# Patient Record
Sex: Female | Born: 1965
Health system: Southern US, Community
[De-identification: ages and names within clinical notes are randomized; demographics above are authoritative.]

## PROBLEM LIST (undated history)

## (undated) ENCOUNTER — Emergency Department (HOSPITAL_COMMUNITY): Admission: EM | Payer: Self-pay | Source: Home / Self Care

## (undated) ENCOUNTER — Emergency Department (HOSPITAL_BASED_OUTPATIENT_CLINIC_OR_DEPARTMENT_OTHER): Payer: Self-pay | Source: Home / Self Care

## (undated) DIAGNOSIS — G8929 Other chronic pain: Secondary | ICD-10-CM

## (undated) DIAGNOSIS — M5441 Lumbago with sciatica, right side: Secondary | ICD-10-CM

## (undated) DIAGNOSIS — R0602 Shortness of breath: Secondary | ICD-10-CM

## (undated) DIAGNOSIS — E785 Hyperlipidemia, unspecified: Secondary | ICD-10-CM

## (undated) DIAGNOSIS — I480 Paroxysmal atrial fibrillation: Secondary | ICD-10-CM

## (undated) DIAGNOSIS — K219 Gastro-esophageal reflux disease without esophagitis: Secondary | ICD-10-CM

## (undated) DIAGNOSIS — R7989 Other specified abnormal findings of blood chemistry: Secondary | ICD-10-CM

## (undated) DIAGNOSIS — M109 Gout, unspecified: Secondary | ICD-10-CM

## (undated) DIAGNOSIS — C801 Malignant (primary) neoplasm, unspecified: Secondary | ICD-10-CM

## (undated) DIAGNOSIS — D519 Vitamin B12 deficiency anemia, unspecified: Secondary | ICD-10-CM

## (undated) DIAGNOSIS — M797 Fibromyalgia: Secondary | ICD-10-CM

## (undated) DIAGNOSIS — E669 Obesity, unspecified: Secondary | ICD-10-CM

## (undated) DIAGNOSIS — I428 Other cardiomyopathies: Secondary | ICD-10-CM

## (undated) DIAGNOSIS — K259 Gastric ulcer, unspecified as acute or chronic, without hemorrhage or perforation: Secondary | ICD-10-CM

## (undated) DIAGNOSIS — I5022 Chronic systolic (congestive) heart failure: Secondary | ICD-10-CM

## (undated) DIAGNOSIS — M5442 Lumbago with sciatica, left side: Secondary | ICD-10-CM

## (undated) DIAGNOSIS — I639 Cerebral infarction, unspecified: Secondary | ICD-10-CM

## (undated) DIAGNOSIS — D75839 Thrombocytosis, unspecified: Secondary | ICD-10-CM

## (undated) DIAGNOSIS — R778 Other specified abnormalities of plasma proteins: Secondary | ICD-10-CM

## (undated) DIAGNOSIS — D3A010 Benign carcinoid tumor of the duodenum: Secondary | ICD-10-CM

## (undated) DIAGNOSIS — E876 Hypokalemia: Secondary | ICD-10-CM

## (undated) DIAGNOSIS — I10 Essential (primary) hypertension: Secondary | ICD-10-CM

## (undated) DIAGNOSIS — E1110 Type 2 diabetes mellitus with ketoacidosis without coma: Secondary | ICD-10-CM

## (undated) DIAGNOSIS — Z765 Malingerer [conscious simulation]: Secondary | ICD-10-CM

## (undated) DIAGNOSIS — D649 Anemia, unspecified: Secondary | ICD-10-CM

## (undated) DIAGNOSIS — R079 Chest pain, unspecified: Secondary | ICD-10-CM

## (undated) DIAGNOSIS — J189 Pneumonia, unspecified organism: Secondary | ICD-10-CM

## (undated) DIAGNOSIS — I517 Cardiomegaly: Secondary | ICD-10-CM

## (undated) DIAGNOSIS — N1832 Chronic kidney disease, stage 3b: Secondary | ICD-10-CM

## (undated) DIAGNOSIS — M4807 Spinal stenosis, lumbosacral region: Secondary | ICD-10-CM

## (undated) DIAGNOSIS — K3184 Gastroparesis: Secondary | ICD-10-CM

## (undated) DIAGNOSIS — N179 Acute kidney failure, unspecified: Secondary | ICD-10-CM

## (undated) DIAGNOSIS — N83209 Unspecified ovarian cyst, unspecified side: Secondary | ICD-10-CM

## (undated) DIAGNOSIS — E119 Type 2 diabetes mellitus without complications: Secondary | ICD-10-CM

## (undated) HISTORY — DX: Chronic systolic (congestive) heart failure: I50.22

## (undated) HISTORY — DX: Paroxysmal atrial fibrillation: I48.0

## (undated) HISTORY — DX: Cardiomegaly: I51.7

## (undated) HISTORY — DX: Hypomagnesemia: E83.42

## (undated) HISTORY — DX: Other cardiomyopathies: I42.8

## (undated) HISTORY — PX: CHOLECYSTECTOMY: SHX55

## (undated) HISTORY — DX: Acute kidney failure, unspecified: N17.9

## (undated) HISTORY — DX: Hypokalemia: E87.6

## (undated) SURGERY — Surgical Case
Anesthesia: *Unknown

---

## 1998-06-30 ENCOUNTER — Emergency Department (HOSPITAL_COMMUNITY): Admission: EM | Admit: 1998-06-30 | Discharge: 1998-06-30 | Payer: Self-pay | Admitting: Emergency Medicine

## 2000-10-12 ENCOUNTER — Inpatient Hospital Stay (HOSPITAL_COMMUNITY): Admission: AD | Admit: 2000-10-12 | Discharge: 2000-10-12 | Payer: Self-pay | Admitting: Obstetrics

## 2000-10-31 ENCOUNTER — Encounter: Admission: RE | Admit: 2000-10-31 | Discharge: 2001-01-29 | Payer: Self-pay | Admitting: Internal Medicine

## 2003-05-25 ENCOUNTER — Inpatient Hospital Stay (HOSPITAL_COMMUNITY): Admission: AD | Admit: 2003-05-25 | Discharge: 2003-05-25 | Payer: Self-pay | Admitting: Family Medicine

## 2008-10-24 ENCOUNTER — Emergency Department (HOSPITAL_COMMUNITY): Admission: EM | Admit: 2008-10-24 | Discharge: 2008-10-24 | Payer: Self-pay | Admitting: Emergency Medicine

## 2009-01-21 ENCOUNTER — Encounter: Admission: RE | Admit: 2009-01-21 | Discharge: 2009-01-21 | Payer: Self-pay | Admitting: Orthopaedic Surgery

## 2009-08-12 ENCOUNTER — Emergency Department (HOSPITAL_COMMUNITY): Admission: EM | Admit: 2009-08-12 | Discharge: 2009-08-12 | Payer: Self-pay | Admitting: Emergency Medicine

## 2009-10-20 ENCOUNTER — Encounter: Admission: RE | Admit: 2009-10-20 | Discharge: 2009-10-20 | Payer: Self-pay | Admitting: Internal Medicine

## 2010-03-24 ENCOUNTER — Emergency Department (HOSPITAL_COMMUNITY)
Admission: EM | Admit: 2010-03-24 | Discharge: 2010-03-24 | Payer: Self-pay | Source: Home / Self Care | Admitting: Emergency Medicine

## 2010-03-25 LAB — BASIC METABOLIC PANEL
BUN: 14 mg/dL (ref 6–23)
CO2: 24 mEq/L (ref 19–32)
Calcium: 9.5 mg/dL (ref 8.4–10.5)
Chloride: 97 mEq/L (ref 96–112)
Creatinine, Ser: 1.13 mg/dL (ref 0.4–1.2)
GFR calc Af Amer: >60 mL/min (ref >60)
GFR calc non Af Amer: 52 mL/min — ABNORMAL LOW (ref >60)
Glucose, Bld: 285 mg/dL — ABNORMAL HIGH (ref 70–99)
Potassium: 3.5 mEq/L (ref 3.5–5.1)
Sodium: 133 mEq/L — ABNORMAL LOW (ref 135–145)

## 2010-03-25 LAB — GLUCOSE, CAPILLARY: Glucose-Capillary: 297 mg/dL — ABNORMAL HIGH (ref 70–99)

## 2010-03-25 LAB — CBC
HCT: 36.2 % (ref 36.0–46.0)
Hemoglobin: 12 g/dL (ref 12.0–15.0)
MCH: 30 pg (ref 26.0–34.0)
MCHC: 33.1 g/dL (ref 30.0–36.0)
MCV: 90.5 fL (ref 78.0–100.0)
Platelets: 370 10*3/uL (ref 150–400)
RBC: 4 MIL/uL (ref 3.87–5.11)
RDW: 13.6 % (ref 11.5–15.5)
WBC: 12.8 10*3/uL — ABNORMAL HIGH (ref 4.0–10.5)

## 2010-03-25 LAB — DIFFERENTIAL
Basophils Absolute: 0 10*3/uL (ref 0.0–0.1)
Basophils Relative: 0 % (ref 0–1)
Eosinophils Absolute: 0.1 10*3/uL (ref 0.0–0.7)
Eosinophils Relative: 1 % (ref 0–5)
Lymphocytes Relative: 32 % (ref 12–46)
Lymphs Abs: 4.2 10*3/uL — ABNORMAL HIGH (ref 0.7–4.0)
Monocytes Absolute: 0.7 10*3/uL (ref 0.1–1.0)
Monocytes Relative: 6 % (ref 3–12)
Neutro Abs: 7.8 10*3/uL — ABNORMAL HIGH (ref 1.7–7.7)
Neutrophils Relative %: 61 % (ref 43–77)

## 2010-03-25 LAB — CK TOTAL AND CKMB (NOT AT ARMC)
CK, MB: 3 ng/mL (ref 0.3–4.0)
Relative Index: 1.7 (ref 0.0–2.5)
Total CK: 177 U/L (ref 7–177)

## 2010-03-25 LAB — TROPONIN I: Troponin I: 0.02 ng/mL (ref 0.00–0.06)

## 2010-03-30 ENCOUNTER — Emergency Department (HOSPITAL_COMMUNITY)
Admission: EM | Admit: 2010-03-30 | Discharge: 2010-03-30 | Payer: Self-pay | Source: Home / Self Care | Admitting: Emergency Medicine

## 2010-04-29 ENCOUNTER — Ambulatory Visit (HOSPITAL_COMMUNITY)
Admission: RE | Admit: 2010-04-29 | Discharge: 2010-04-29 | Disposition: A | Discharge status: Home / Self Care | Payer: BC Managed Care – PPO | Source: Ambulatory Visit | Attending: Cardiology | Admitting: Cardiology

## 2010-04-29 DIAGNOSIS — I10 Essential (primary) hypertension: Secondary | ICD-10-CM | POA: Insufficient documentation

## 2010-04-29 DIAGNOSIS — R0602 Shortness of breath: Secondary | ICD-10-CM | POA: Insufficient documentation

## 2010-04-29 DIAGNOSIS — R079 Chest pain, unspecified: Secondary | ICD-10-CM | POA: Insufficient documentation

## 2010-04-29 DIAGNOSIS — E119 Type 2 diabetes mellitus without complications: Secondary | ICD-10-CM | POA: Insufficient documentation

## 2010-04-29 LAB — GLUCOSE, CAPILLARY: Glucose-Capillary: 170 mg/dL — ABNORMAL HIGH (ref 70–99)

## 2010-05-25 LAB — CBC
HCT: 34.1 % — ABNORMAL LOW (ref 36.0–46.0)
Hemoglobin: 11.3 g/dL — ABNORMAL LOW (ref 12.0–15.0)
MCHC: 33 g/dL (ref 30.0–36.0)
MCV: 93.1 fL (ref 78.0–100.0)
Platelets: 338 10*3/uL (ref 150–400)
RBC: 3.66 MIL/uL — ABNORMAL LOW (ref 3.87–5.11)
RDW: 14.7 % (ref 11.5–15.5)
WBC: 12.5 10*3/uL — ABNORMAL HIGH (ref 4.0–10.5)

## 2010-05-25 LAB — COMPREHENSIVE METABOLIC PANEL
ALT: 17 U/L (ref 0–35)
AST: 20 U/L (ref 0–37)
Albumin: 3.6 g/dL (ref 3.5–5.2)
Alkaline Phosphatase: 84 U/L (ref 39–117)
BUN: 8 mg/dL (ref 6–23)
CO2: 30 mEq/L (ref 19–32)
Calcium: 8.7 mg/dL (ref 8.4–10.5)
Chloride: 99 mEq/L (ref 96–112)
Creatinine, Ser: 0.98 mg/dL (ref 0.4–1.2)
GFR calc Af Amer: >60 mL/min (ref >60)
GFR calc non Af Amer: >60 mL/min (ref >60)
Glucose, Bld: 303 mg/dL — ABNORMAL HIGH (ref 70–99)
Potassium: 2.9 mEq/L — ABNORMAL LOW (ref 3.5–5.1)
Sodium: 135 mEq/L (ref 135–145)
Total Bilirubin: 0.8 mg/dL (ref 0.3–1.2)
Total Protein: 7.6 g/dL (ref 6.0–8.3)

## 2010-05-25 LAB — DIFFERENTIAL
Basophils Absolute: 0.1 10*3/uL (ref 0.0–0.1)
Basophils Relative: 1 % (ref 0–1)
Eosinophils Absolute: 0.2 10*3/uL (ref 0.0–0.7)
Eosinophils Relative: 1 % (ref 0–5)
Lymphocytes Relative: 33 % (ref 12–46)
Lymphs Abs: 4.2 10*3/uL — ABNORMAL HIGH (ref 0.7–4.0)
Monocytes Absolute: 0.7 10*3/uL (ref 0.1–1.0)
Monocytes Relative: 5 % (ref 3–12)
Neutro Abs: 7.4 10*3/uL (ref 1.7–7.7)
Neutrophils Relative %: 60 % (ref 43–77)

## 2010-06-13 LAB — GLUCOSE, CAPILLARY: Glucose-Capillary: 414 mg/dL — ABNORMAL HIGH (ref 70–99)

## 2010-07-17 ENCOUNTER — Emergency Department (HOSPITAL_COMMUNITY)
Admission: EM | Admit: 2010-07-17 | Discharge: 2010-07-17 | Disposition: A | Discharge status: Home / Self Care | Payer: BC Managed Care – PPO | Attending: Emergency Medicine | Admitting: Emergency Medicine

## 2010-07-17 DIAGNOSIS — Z794 Long term (current) use of insulin: Secondary | ICD-10-CM | POA: Insufficient documentation

## 2010-07-17 DIAGNOSIS — E119 Type 2 diabetes mellitus without complications: Secondary | ICD-10-CM | POA: Insufficient documentation

## 2010-07-17 DIAGNOSIS — R000 Tachycardia, unspecified: Secondary | ICD-10-CM | POA: Insufficient documentation

## 2010-07-17 DIAGNOSIS — T50901A Poisoning by unspecified drugs, medicaments and biological substances, accidental (unintentional), initial encounter: Secondary | ICD-10-CM | POA: Insufficient documentation

## 2010-07-17 DIAGNOSIS — I10 Essential (primary) hypertension: Secondary | ICD-10-CM | POA: Insufficient documentation

## 2010-07-17 LAB — COMPREHENSIVE METABOLIC PANEL
ALT: 17 U/L (ref 0–35)
AST: 14 U/L (ref 0–37)
Albumin: 3.4 g/dL — ABNORMAL LOW (ref 3.5–5.2)
Alkaline Phosphatase: 96 U/L (ref 39–117)
BUN: 15 mg/dL (ref 6–23)
CO2: 28 mEq/L (ref 19–32)
Calcium: 9.4 mg/dL (ref 8.4–10.5)
Chloride: 94 mEq/L — ABNORMAL LOW (ref 96–112)
Creatinine, Ser: 0.98 mg/dL (ref 0.4–1.2)
GFR calc Af Amer: >60 mL/min (ref >60)
GFR calc non Af Amer: >60 mL/min (ref >60)
Glucose, Bld: 403 mg/dL — ABNORMAL HIGH (ref 70–99)
Potassium: 3.4 mEq/L — ABNORMAL LOW (ref 3.5–5.1)
Sodium: 132 mEq/L — ABNORMAL LOW (ref 135–145)
Total Bilirubin: 0.2 mg/dL — ABNORMAL LOW (ref 0.3–1.2)
Total Protein: 7.3 g/dL (ref 6.0–8.3)

## 2010-07-17 LAB — CBC
HCT: 33.8 % — ABNORMAL LOW (ref 36.0–46.0)
Hemoglobin: 10.9 g/dL — ABNORMAL LOW (ref 12.0–15.0)
MCH: 28.8 pg (ref 26.0–34.0)
MCHC: 32.2 g/dL (ref 30.0–36.0)
MCV: 89.2 fL (ref 78.0–100.0)
Platelets: 377 10*3/uL (ref 150–400)
RBC: 3.79 MIL/uL — ABNORMAL LOW (ref 3.87–5.11)
RDW: 13 % (ref 11.5–15.5)
WBC: 10.1 10*3/uL (ref 4.0–10.5)

## 2010-07-17 LAB — DIFFERENTIAL
Basophils Absolute: 0 10*3/uL (ref 0.0–0.1)
Basophils Relative: 0 % (ref 0–1)
Eosinophils Absolute: 0.2 10*3/uL (ref 0.0–0.7)
Eosinophils Relative: 2 % (ref 0–5)
Lymphocytes Relative: 35 % (ref 12–46)
Lymphs Abs: 3.5 10*3/uL (ref 0.7–4.0)
Monocytes Absolute: 0.5 10*3/uL (ref 0.1–1.0)
Monocytes Relative: 5 % (ref 3–12)
Neutro Abs: 5.9 10*3/uL (ref 1.7–7.7)
Neutrophils Relative %: 59 % (ref 43–77)

## 2010-07-17 LAB — GLUCOSE, CAPILLARY
Glucose-Capillary: 401 mg/dL — ABNORMAL HIGH (ref 70–99)
Glucose-Capillary: 349 mg/dL — ABNORMAL HIGH (ref 70–99)

## 2010-07-17 LAB — POCT CARDIAC MARKERS
CKMB, poc: 2.5 ng/mL (ref 1.0–8.0)
Myoglobin, poc: 121 ng/mL (ref 12–200)
Troponin i, poc: <0.05 ng/mL (ref 0.00–0.09)
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
## 2011-01-18 ENCOUNTER — Emergency Department (HOSPITAL_COMMUNITY): Admission: EM | Admit: 2011-01-18 | Discharge: 2011-01-18 | Discharge status: Against Medical Advice | Payer: Self-pay

## 2011-02-04 ENCOUNTER — Emergency Department (HOSPITAL_BASED_OUTPATIENT_CLINIC_OR_DEPARTMENT_OTHER)
Admission: EM | Admit: 2011-02-04 | Discharge: 2011-02-04 | Discharge status: Against Medical Advice | Payer: Self-pay | Attending: Emergency Medicine | Admitting: Emergency Medicine

## 2011-02-04 DIAGNOSIS — R05 Cough: Secondary | ICD-10-CM

## 2011-02-04 DIAGNOSIS — R059 Cough, unspecified: Secondary | ICD-10-CM | POA: Insufficient documentation

## 2011-02-04 NOTE — ED Notes (Signed)
N/v/d/,sinus and chest congestion,nonprod cough,HA x 1 week

## 2011-02-06 DIAGNOSIS — I639 Cerebral infarction, unspecified: Secondary | ICD-10-CM

## 2011-02-06 HISTORY — DX: Cerebral infarction, unspecified: I63.9
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
## 2011-09-21 ENCOUNTER — Emergency Department (HOSPITAL_COMMUNITY): Payer: Self-pay

## 2011-09-21 ENCOUNTER — Encounter (HOSPITAL_COMMUNITY): Payer: Self-pay | Admitting: *Deleted

## 2011-09-21 ENCOUNTER — Emergency Department (HOSPITAL_COMMUNITY)
Admission: EM | Admit: 2011-09-21 | Discharge: 2011-09-22 | Disposition: A | Discharge status: Home / Self Care | Payer: Self-pay | Attending: Emergency Medicine | Admitting: Emergency Medicine

## 2011-09-21 DIAGNOSIS — I10 Essential (primary) hypertension: Secondary | ICD-10-CM | POA: Insufficient documentation

## 2011-09-21 DIAGNOSIS — Z794 Long term (current) use of insulin: Secondary | ICD-10-CM | POA: Insufficient documentation

## 2011-09-21 DIAGNOSIS — E860 Dehydration: Secondary | ICD-10-CM | POA: Insufficient documentation

## 2011-09-21 DIAGNOSIS — E669 Obesity, unspecified: Secondary | ICD-10-CM | POA: Insufficient documentation

## 2011-09-21 DIAGNOSIS — R739 Hyperglycemia, unspecified: Secondary | ICD-10-CM

## 2011-09-21 DIAGNOSIS — R1110 Vomiting, unspecified: Secondary | ICD-10-CM | POA: Insufficient documentation

## 2011-09-21 DIAGNOSIS — E119 Type 2 diabetes mellitus without complications: Secondary | ICD-10-CM | POA: Insufficient documentation

## 2011-09-21 HISTORY — DX: Essential (primary) hypertension: I10

## 2011-09-21 LAB — HEPATIC FUNCTION PANEL
AST: 17 U/L (ref 0–37)
Albumin: 3.3 g/dL — ABNORMAL LOW (ref 3.5–5.2)
Bilirubin, Direct: <0.1 mg/dL (ref 0.0–0.3)
Total Protein: 7.8 g/dL (ref 6.0–8.3)

## 2011-09-21 LAB — CBC WITH DIFFERENTIAL/PLATELET
Basophils Absolute: 0 10*3/uL (ref 0.0–0.1)
Lymphocytes Relative: 34 % (ref 12–46)
Neutro Abs: 6 10*3/uL (ref 1.7–7.7)
Neutrophils Relative %: 57 % (ref 43–77)
Platelets: 375 10*3/uL (ref 150–400)
RBC: 3.92 MIL/uL (ref 3.87–5.11)
RDW: 13.1 % (ref 11.5–15.5)
WBC: 10.6 10*3/uL — ABNORMAL HIGH (ref 4.0–10.5)

## 2011-09-21 LAB — POCT I-STAT TROPONIN I: Troponin i, poc: 0 ng/mL (ref 0.00–0.08)

## 2011-09-21 LAB — BASIC METABOLIC PANEL
CO2: 26 mEq/L (ref 19–32)
Calcium: 9.6 mg/dL (ref 8.4–10.5)
Chloride: 93 mEq/L — ABNORMAL LOW (ref 96–112)
Potassium: 3.4 mEq/L — ABNORMAL LOW (ref 3.5–5.1)
Sodium: 131 mEq/L — ABNORMAL LOW (ref 135–145)

## 2011-09-21 LAB — GLUCOSE, CAPILLARY: Glucose-Capillary: 329 mg/dL — ABNORMAL HIGH (ref 70–99)

## 2011-09-21 IMAGING — CT CT HEAD W/O CM
1 series · 16 of 30 positions shown, 20 images · non-contrast
Comparison: None.

CLINICAL DATA: Headache and blurred vision.

CT HEAD WITHOUT CONTRAST
TECHNIQUE: Contiguous axial images were obtained from the base of
the skull through the vertex without contrast

[Series 2: headseq 4.8 h45s · axial · 0.43mm/px · z∈[-151,+2]mm · 16 of 36 slices shown, 20 images]
[im 2/36  brain]
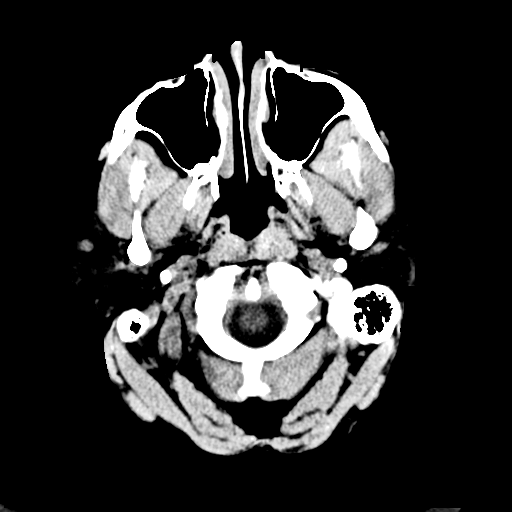
[im 2/36  bone]
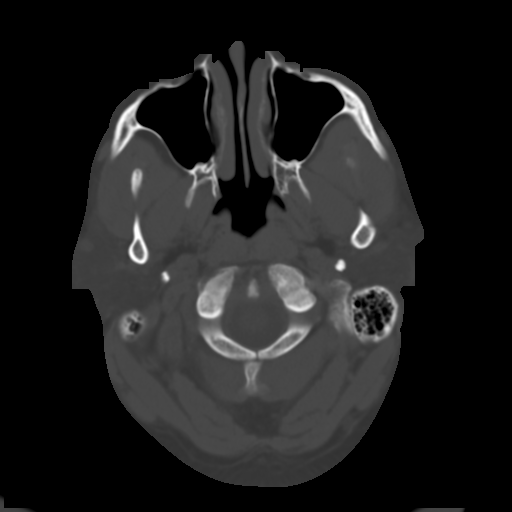
[im 4/36  brain]
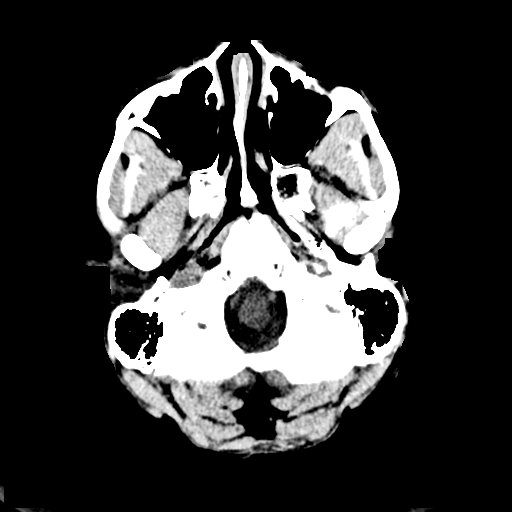
[im 7/36  brain]
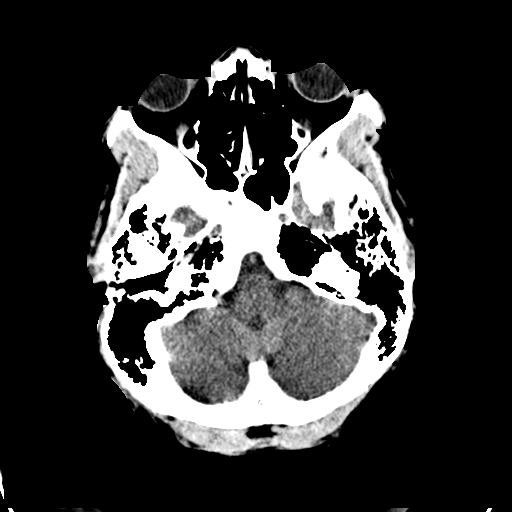
[im 9/36  brain]
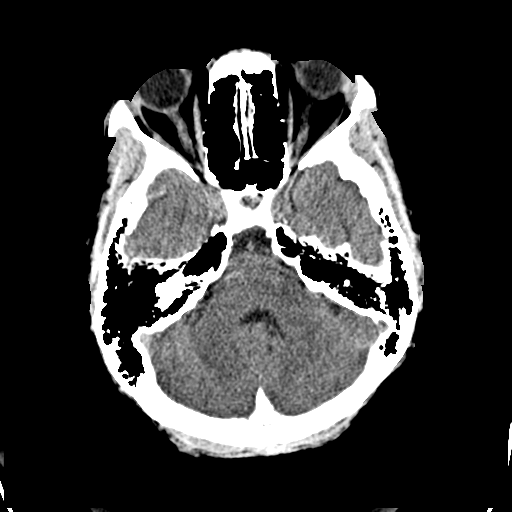
[im 10/36  brain]
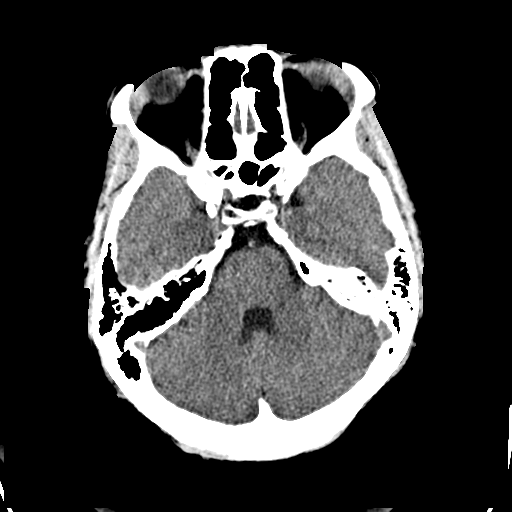
[im 10/36  bone]
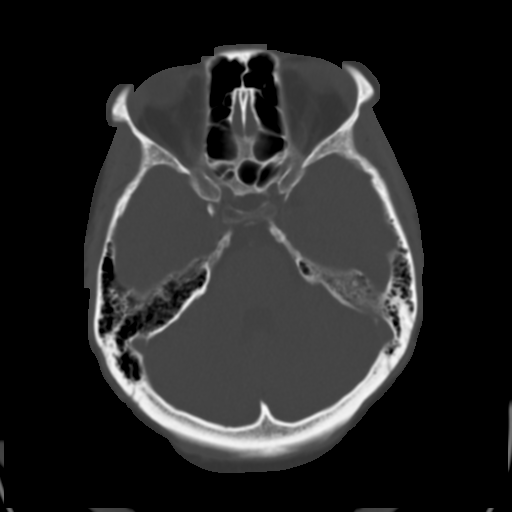
[im 13/36  brain]
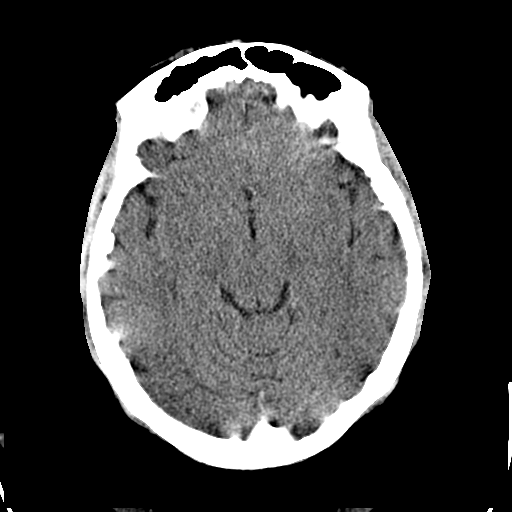
[im 15/36  brain]
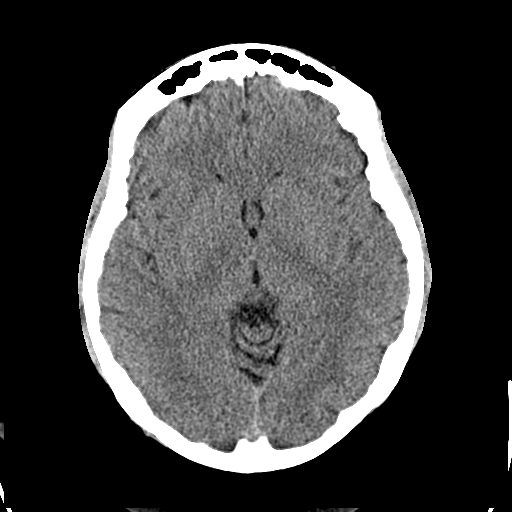
[im 17/36  brain]
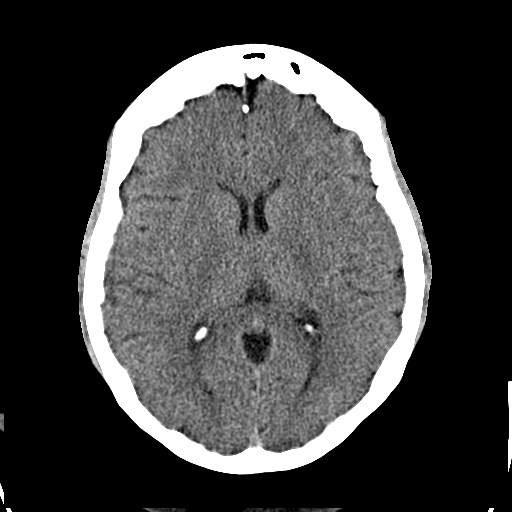
[im 19/36  brain]
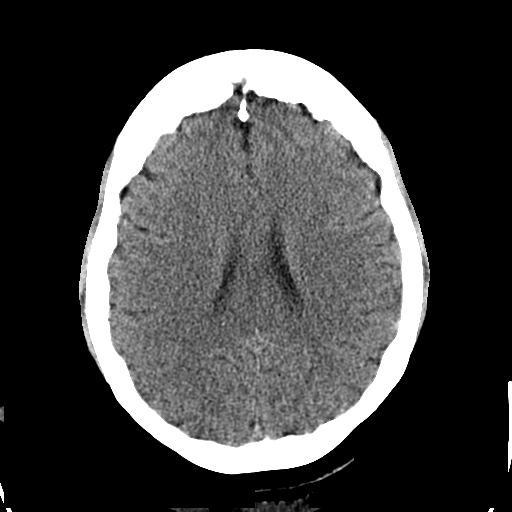
[im 19/36  bone]
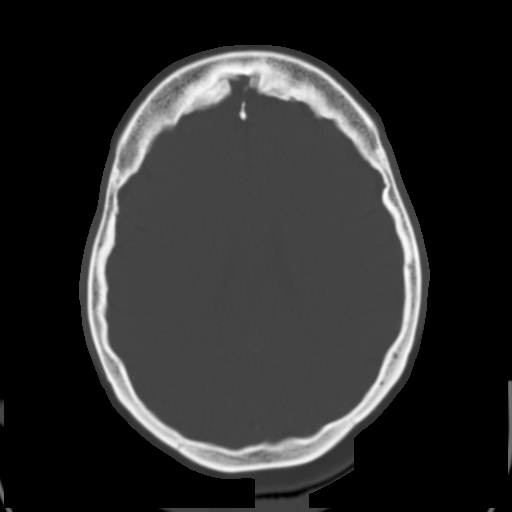
[im 21/36  brain]
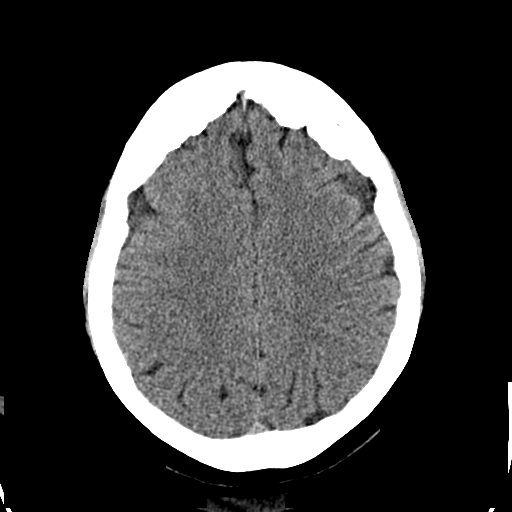
[im 23/36  brain]
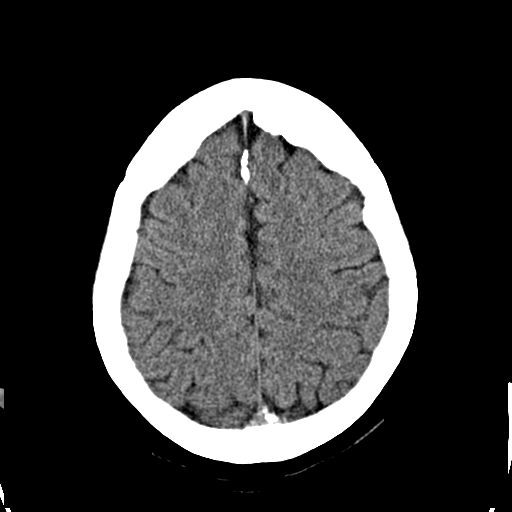
[im 26/36  brain]
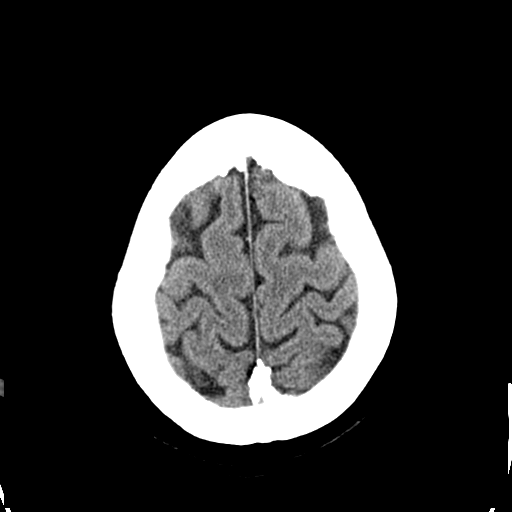
[im 27/36  brain]
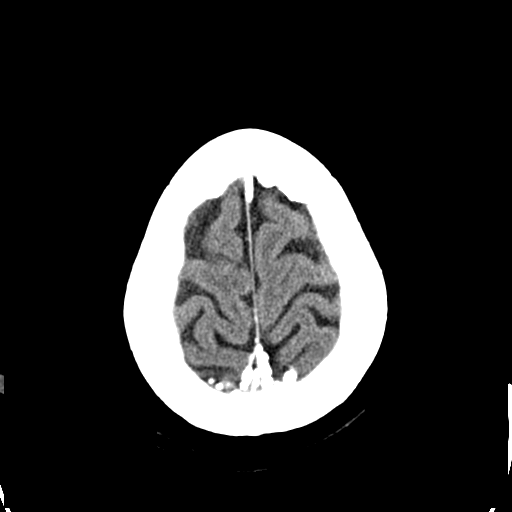
[im 27/36  bone]
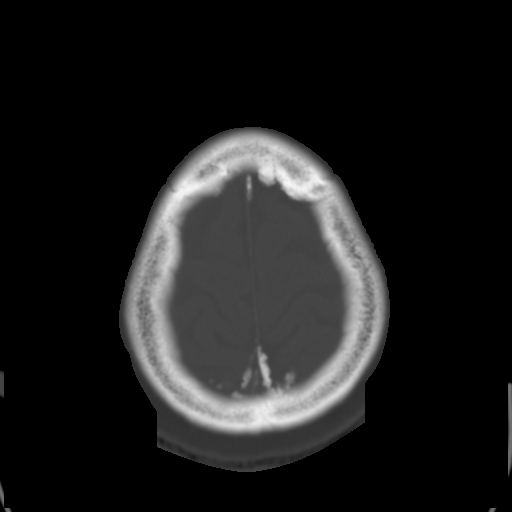
[im 29/36  brain]
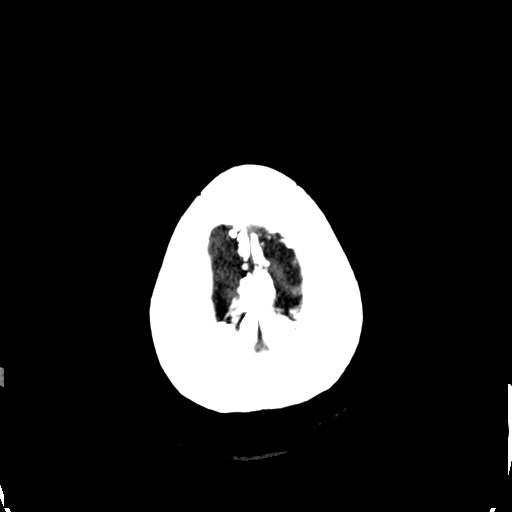
[im 32/36  brain]
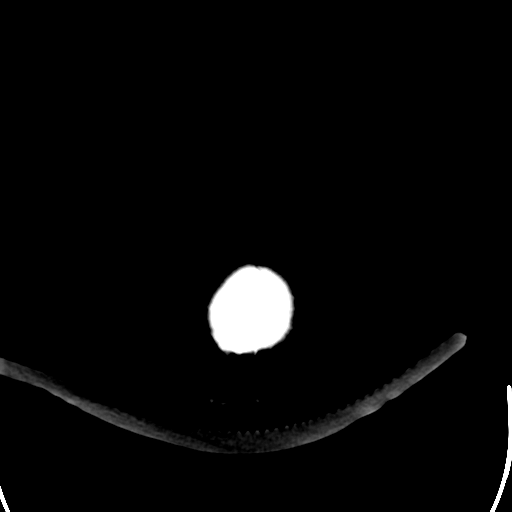
[im 34/36  brain]
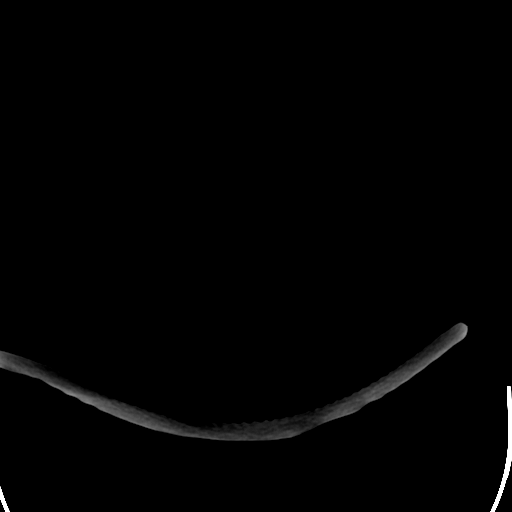

[16 of 30 positions shown; findings below may reference images not displayed]

FINDINGS: The brain has a normal appearance without evidence for
hemorrhage, acute infarction, hydrocephalus, or mass lesion.  There
is no extra axial fluid collection.  The skull and paranasal
sinuses are normal.
IMPRESSION: Normal CT of the head without contrast.

## 2011-09-21 MED ORDER — SODIUM CHLORIDE 0.9 % IV BOLUS (SEPSIS)
1000.0000 mL | Freq: Once | INTRAVENOUS | Status: AC
  Administered 2011-09-21: 1000 mL via INTRAVENOUS

## 2011-09-21 MED ORDER — INSULIN ASPART 100 UNIT/ML ~~LOC~~ SOLN
15.0000 [IU] | Freq: Once | SUBCUTANEOUS | Status: AC
  Administered 2011-09-21: 15 [IU] via SUBCUTANEOUS
  Filled 2011-09-21: qty 1

## 2011-09-21 MED ORDER — HYDROCHLOROTHIAZIDE 50 MG PO TABS: 50.0000 mg | ORAL_TABLET | Freq: Two times a day (BID) | ORAL | Status: DC

## 2011-09-21 MED ORDER — ONDANSETRON HCL 4 MG/2ML IJ SOLN
4.0000 mg | Freq: Once | INTRAMUSCULAR | Status: AC
  Administered 2011-09-21: 4 mg via INTRAVENOUS
  Filled 2011-09-21: qty 2

## 2011-09-21 MED ORDER — AMLODIPINE BESYLATE 5 MG PO TABS: 5.0000 mg | ORAL_TABLET | Freq: Every day | ORAL | Status: DC

## 2011-09-21 MED ORDER — INSULIN NPH ISOPHANE & REGULAR (70-30) 100 UNIT/ML ~~LOC~~ SUSP: 25.0000 [IU] | Freq: Three times a day (TID) | SUBCUTANEOUS | Status: DC

## 2011-09-21 MED ORDER — PROMETHAZINE HCL 25 MG PO TABS: 25.0000 mg | ORAL_TABLET | Freq: Four times a day (QID) | ORAL | Status: DC | PRN

## 2011-09-21 MED ORDER — ASPIRIN 81 MG PO CHEW
324.0000 mg | CHEWABLE_TABLET | Freq: Once | ORAL | Status: AC
  Administered 2011-09-21: 324 mg via ORAL
  Filled 2011-09-21: qty 4

## 2011-09-21 MED ORDER — HYDROMORPHONE HCL PF 1 MG/ML IJ SOLN
1.0000 mg | Freq: Once | INTRAMUSCULAR | Status: AC
  Administered 2011-09-21: 1 mg via INTRAVENOUS
  Filled 2011-09-21: qty 1

## 2011-09-21 NOTE — ED Provider Notes (Signed)
History     [name]: [id number]  Arrival date & time 09/21/11  [id number]   First MD Initiated Contact with Patient 09/21/11 2052      Chief Complaint  Patient presents with  . Chest Pain  . Hyperglycemia    (Consider location/radiation/quality/duration/timing/severity/associated sxs/prior treatment) HPI Comments: Out of insulin for 6 weeks now due to being unable to afford it.  Patient is a 46 y.o. female presenting with abdominal pain. The history is provided by the patient.  Abdominal Pain The primary symptoms of the illness include abdominal pain, shortness of breath, nausea, vomiting and diarrhea. The primary symptoms of the illness do not include fever. Episode onset: 2 weeks ago. The onset of the illness was gradual. The problem has been gradually worsening.  The abdominal pain is located in the LUQ, epigastric region and RUQ. The abdominal pain radiates to the chest. The severity of the abdominal pain is 7/10. The abdominal pain is relieved by nothing. The abdominal pain is exacerbated by vomiting, belching, coughing, deep breathing and certain positions.  The shortness of breath began more than 2 days ago. The shortness of breath is mild.  Onset: 2 weeks ago.  now vomiting daily. Vomiting occurs 2 to 5 times per day. The emesis contains stomach contents (dry heaving as well).  Associated with: no alcohol or NSAID use. The patient states that she believes she is currently not pregnant. The patient has not had a change in bowel habit. Additional symptoms associated with the illness include anorexia and frequency. Symptoms associated with the illness do not include urgency or back pain. Significant associated medical issues include diabetes.    Past Medical History  Diagnosis Date  . Diabetes mellitus   . Hypertension     History reviewed. No pertinent past surgical history.  History reviewed. No pertinent family history.  History  Substance Use Topics  . Smoking status: Never  Smoker   . Smokeless tobacco: Not on file  . Alcohol Use: No    OB History    Grav Para Term Preterm Abortions TAB SAB Ect Mult Living                  Review of Systems  Constitutional: Negative for fever.  Respiratory: Positive for shortness of breath.   Gastrointestinal: Positive for nausea, vomiting, abdominal pain, diarrhea and anorexia.  Genitourinary: Positive for frequency. Negative for urgency.  Musculoskeletal: Negative for back pain.  All other systems reviewed and are negative.    Allergies  Valium; Lisinopril; and Penicillins  Home Medications   Current Outpatient Rx  Name Route Sig Dispense Refill  . AMLODIPINE BESYLATE 5 MG PO TABS Oral Take 5 mg by mouth daily.    [name] HYDROCHLOROTHIAZIDE 50 MG PO TABS Oral Take 50 mg by mouth 2 (two) times daily.    . INSULIN ISOPHANE & REGULAR (70-30) 100 UNIT/ML [place] SUSP Subcutaneous Inject 25-75 Units into the skin 3 (three) times daily. 75 units in the morning, 25 units at midday, & 75 units in the evening      BP 139/78  Pulse 116  Temp 99.4 F (37.4 C) (Oral)  Resp 26  Ht 5\' 9"  (1.753 m)  SpO2 97%  LMP 08/22/2011  Physical Exam  Nursing note and vitals reviewed. Constitutional: She is oriented to person, place, and time. She appears well-developed and well-nourished. No distress.       Obese female  HENT:  Head: Normocephalic and atraumatic.  Mouth/Throat: Oropharynx is clear and moist.  Mucous membranes are dry.  Eyes: Conjunctivae and EOM are normal. Pupils are equal, round, and reactive to light.  Neck: Normal range of motion. Neck supple.  Cardiovascular: Regular rhythm and intact distal pulses.  Tachycardia present.   No murmur heard. Pulmonary/Chest: Effort normal and breath sounds normal. No respiratory distress. She has no wheezes. She has no rales.  Abdominal: Soft. Normal appearance. She exhibits no distension. There is tenderness in the right upper quadrant, epigastric area and left upper quadrant.  There is no rebound and no guarding.  Musculoskeletal: Normal range of motion. She exhibits no edema and no tenderness.  Neurological: She is alert and oriented to person, place, and time.  Skin: Skin is warm and dry. No rash noted. No erythema.  Psychiatric: She has a normal mood and affect. Her behavior is normal.    ED Course  Procedures (including critical care time)  Labs Reviewed  GLUCOSE, CAPILLARY - Abnormal; Notable for the following:    Glucose-Capillary 379 (*)     All other components within normal limits  CBC WITH DIFFERENTIAL - Abnormal; Notable for the following:    WBC 10.6 (*)     Hemoglobin 11.5 (*)     HCT 34.7 (*)     All other components within normal limits  BASIC METABOLIC PANEL - Abnormal; Notable for the following:    Sodium 131 (*)     Potassium 3.4 (*)     Chloride 93 (*)     Glucose, Bld 393 (*)     GFR calc non Af Amer 70 (*)     GFR calc Af Amer 81 (*)     All other components within normal limits  HEPATIC FUNCTION PANEL - Abnormal; Notable for the following:    Albumin 3.3 (*)     Alkaline Phosphatase 120 (*)     Total Bilirubin 0.2 (*)     All other components within normal limits  GLUCOSE, CAPILLARY - Abnormal; Notable for the following:    Glucose-Capillary 329 (*)     All other components within normal limits  POCT I-STAT TROPONIN I  LIPASE, BLOOD  POCT I-STAT TROPONIN I   Dg Chest 2 View  09/21/2011  *RADIOLOGY REPORT*  Clinical Data: Chest pain, hyperglycemia  CHEST - 2 VIEW  Comparison: 03/24/2010  Findings: Cardiomediastinal silhouette is within normal limits. The lungs are clear. No pleural effusion.  No pneumothorax.  No acute osseous abnormality. Lung volumes are low with crowding of the bronchovascular markings.  IMPRESSION: Normal chest, allowing for hypoaeration.  Original Report Authenticated By: Arline Asp, M.D.     Date: 09/21/2011  Rate: 120  Rhythm: sinus tachycardia  QRS Axis: normal  Intervals: normal  ST/T  Wave abnormalities: normal  Conduction Disutrbances:none  Narrative Interpretation:   Old EKG Reviewed: unchanged   1. Hyperglycemia   2. Dehydration   3. Vomiting       MDM   Patient has been out of her insulin for a month and a half no due to no insurance and being able to afford it. She states for the last one to 2 weeks she's had vomiting multiple times today which has only worsened. Then in the last 4-5 days she started to have chest pain. It is worse with deep breathing and with vomiting. She also complains of shortness of breath which is nonspecific. It's not positional or exertional. She does complain of intermittent coughing that is nonproductive and no fever.   Upper abdominal pain on  exam it initially was tachycardic on presentation. Normal blood pressure and O2 sat.  Lipase is within normal limits, BNP was hyperglycemia with a sugar of 393 and normal LFTs. CBC unremarkable, i-STAT troponin negative, EKG was sinus tachycardia but otherwise normal and negative chest x-ray. The fluids blood sugar only improved to 326 the patient was given IV insulin and another liter. She was given medications to prevent vomiting. Will get a second troponin at 3 hours and feel the patient will be adequately ruled out as her symptoms have been ongoing for multiple days. Low concern for PE at this time and feel her pain as a result of repeated vomiting.  11:47 PM Pt tolerating po's.  Spoke with pharmacy and will get pt her insulin.  Pt has been speaking with multiple manufacturers about getting insulin and recently got a job but insurance does not start for another 3 months.  Second troponin neg.      Blanchie Dessert, MD 09/21/11 2348

## 2011-09-21 NOTE — ED Notes (Signed)
Pt c/o central chest pain that began this AM, states was driving to work when pain started today. Pt also reports hyperglycemia states "I have not had my insulin for a month and a half now because I don't have insurance." pt did not check CBG at home.

## 2011-09-21 NOTE — ED Notes (Signed)
MD at bedside.  EDP Plunkett present to evaluate this pt.  Pt reports vomiting since yesterday- Unable to afford insulin for several months.  Pt reports increased pain to chest after vomiting.  Plan of care discussed with pt and family

## 2011-09-22 ENCOUNTER — Emergency Department (HOSPITAL_COMMUNITY)
Admission: EM | Admit: 2011-09-22 | Discharge: 2011-09-22 | Disposition: A | Discharge status: Home / Self Care | Payer: Self-pay | Attending: Emergency Medicine | Admitting: Emergency Medicine

## 2011-09-22 ENCOUNTER — Encounter (HOSPITAL_COMMUNITY): Payer: Self-pay

## 2011-09-22 ENCOUNTER — Emergency Department (HOSPITAL_COMMUNITY): Payer: Self-pay

## 2011-09-22 DIAGNOSIS — I10 Essential (primary) hypertension: Secondary | ICD-10-CM | POA: Insufficient documentation

## 2011-09-22 DIAGNOSIS — Z7982 Long term (current) use of aspirin: Secondary | ICD-10-CM | POA: Insufficient documentation

## 2011-09-22 DIAGNOSIS — J069 Acute upper respiratory infection, unspecified: Secondary | ICD-10-CM | POA: Insufficient documentation

## 2011-09-22 DIAGNOSIS — Z794 Long term (current) use of insulin: Secondary | ICD-10-CM | POA: Insufficient documentation

## 2011-09-22 DIAGNOSIS — E669 Obesity, unspecified: Secondary | ICD-10-CM | POA: Insufficient documentation

## 2011-09-22 DIAGNOSIS — E119 Type 2 diabetes mellitus without complications: Secondary | ICD-10-CM | POA: Insufficient documentation

## 2011-09-22 LAB — POCT I-STAT, CHEM 8
BUN: 9 mg/dL (ref 6–23)
Chloride: 102 mEq/L (ref 96–112)
Creatinine, Ser: 0.8 mg/dL (ref 0.50–1.10)
Potassium: 3.7 mEq/L (ref 3.5–5.1)
Sodium: 138 mEq/L (ref 135–145)

## 2011-09-22 LAB — D-DIMER, QUANTITATIVE: D-Dimer, Quant: 1 ug/mL-FEU — ABNORMAL HIGH (ref 0.00–0.48)

## 2011-09-22 LAB — GLUCOSE, CAPILLARY: Glucose-Capillary: 282 mg/dL — ABNORMAL HIGH (ref 70–99)

## 2011-09-22 MED ORDER — IPRATROPIUM BROMIDE 0.02 % IN SOLN
0.5000 mg | Freq: Once | RESPIRATORY_TRACT | Status: AC
  Administered 2011-09-22: 0.5 mg via RESPIRATORY_TRACT
  Filled 2011-09-22: qty 2.5

## 2011-09-22 MED ORDER — ALBUTEROL SULFATE HFA 108 (90 BASE) MCG/ACT IN AERS: 1.0000 | INHALATION_SPRAY | Freq: Four times a day (QID) | RESPIRATORY_TRACT | Status: DC | PRN

## 2011-09-22 MED ORDER — IOHEXOL 350 MG/ML SOLN
100.0000 mL | Freq: Once | INTRAVENOUS | Status: AC | PRN
  Administered 2011-09-22: 100 mL via INTRAVENOUS

## 2011-09-22 MED ORDER — ALBUTEROL SULFATE (5 MG/ML) 0.5% IN NEBU
2.5000 mg | INHALATION_SOLUTION | Freq: Once | RESPIRATORY_TRACT | Status: AC
  Administered 2011-09-22: 2.5 mg via RESPIRATORY_TRACT
  Filled 2011-09-22: qty 0.5

## 2011-09-22 MED ORDER — BENZONATATE 100 MG PO CAPS: 100.0000 mg | ORAL_CAPSULE | Freq: Three times a day (TID) | ORAL | Status: AC

## 2011-09-22 MED ORDER — PSEUDOEPHEDRINE HCL ER 120 MG PO TB12: 120.0000 mg | ORAL_TABLET | Freq: Two times a day (BID) | ORAL | Status: AC

## 2011-09-22 NOTE — ED Provider Notes (Signed)
History     [name]: [id number] Arrival date & time 09/22/11  0931 First MD Initiated Contact with Patient 09/22/11 [phone]      Chief Complaint  Patient presents with  . [name]  . Cough    HPI The patient presents to emergency room complaining of persistent shortness of breath. She has been having trouble with nasal congestion and a productive cough of yellow sputum. The symptoms have been ongoing for the last couple of days. Exertion and coughing make the symptoms worse. Nothing seems to be making it better. Patient does have a history of problems with allergies and bronchitis and she states she will often has to take a Z-Pak during this time of year. Patient was actually seen in the emergency room yesterday for similar symptoms as well as high blood sugar and nausea and vomiting. Patient had been out of for diabetes and blood pressure medications for several weeks. She has not been able to see her primary Dr. due to insurance reasons.  Patient states she is still feeling short of breath this morning and she needs some type of medications to help with her congestion and shortness of breath. She denies any trouble with Swelling. She denies any history of PE or DVT. She does not take any oral contraceptives. Past Medical History  Diagnosis Date  . Diabetes mellitus   . Hypertension     History reviewed. No pertinent past surgical history.  History reviewed. No pertinent family history.  History  Substance Use Topics  . Smoking status: Never Smoker   . Smokeless tobacco: Not on file  . Alcohol Use: No    OB History    Grav Para Term Preterm Abortions TAB SAB Ect Mult Living                  Review of Systems  All other systems reviewed and are negative.    Allergies  Valium; Lisinopril; and Penicillins  Home Medications   Current Outpatient Rx  Name Route Sig Dispense Refill  . AMLODIPINE BESYLATE 5 MG PO TABS Oral Take 5 mg by mouth daily.    . ASPIRIN 81 MG PO  TABS Oral Take 81 mg by mouth daily.    [name] HYDROCHLOROTHIAZIDE 50 MG PO TABS Oral Take 50 mg by mouth 2 (two) times daily.    . INSULIN ISOPHANE & REGULAR (70-30) 100 UNIT/ML [place] SUSP Subcutaneous Inject 25-75 Units into the skin 3 (three) times daily. 75 units in the morning, 25 units at midday, & 75 units in the evening    . DAYQUIL PO Oral Take 1 capsule by mouth every 6 (six) hours as needed. For cold symptoms      BP 128/87  Pulse 101  Temp 98.4 F (36.9 C)  Resp 22  SpO2 100%  LMP 08/22/2011  Physical Exam  Nursing note and vitals reviewed. Constitutional: No distress.       Obese  HENT:  Head: Normocephalic and atraumatic.  Right Ear: External ear normal.  Left Ear: External ear normal.  Eyes: Conjunctivae are normal. Right eye exhibits no discharge. Left eye exhibits no discharge. No scleral icterus.  Neck: Neck supple. No tracheal deviation present.  Cardiovascular: Normal rate, regular rhythm and intact distal pulses.   Pulmonary/Chest: Effort normal and breath sounds normal. No stridor. No respiratory distress. She has no wheezes. She has no rales.  Abdominal: Soft. Bowel sounds are normal. She exhibits no distension. There is no tenderness. There is no rebound  and no guarding.  Musculoskeletal: She exhibits no edema and no tenderness.  Neurological: She is alert. She has normal strength. No sensory deficit. Cranial nerve deficit:  no gross defecits noted. She exhibits normal muscle tone. She displays no seizure activity. Coordination normal.  Skin: Skin is warm and dry. No rash noted.  Psychiatric: She has a normal mood and affect.    ED Course  Procedures (including critical care time) EKG Rate 94 SINUS RHYTHM ~ normal P axis, V-rate 50- 99 LEFT AXIS DEVIATION ~ QRS axis (-30,-90) Nl intervals, sinus tach resolved since yesterday. Labs Reviewed  D-DIMER, QUANTITATIVE - Abnormal; Notable for the following:    D-Dimer, Quant 1.00 (*)     All other components within  normal limits  POCT I-STAT, CHEM 8 - Abnormal; Notable for the following:    Glucose, Bld 328 (*)     All other components within normal limits   Dg Chest 2 View  09/21/2011  *RADIOLOGY REPORT*  Clinical Data: Chest pain, hyperglycemia  CHEST - 2 VIEW  Comparison: 03/24/2010  Findings: Cardiomediastinal silhouette is within normal limits. The lungs are clear. No pleural effusion.  No pneumothorax.  No acute osseous abnormality. Lung volumes are low with crowding of the bronchovascular markings.  IMPRESSION: Normal chest, allowing for hypoaeration.  Original Report Authenticated By: Arline Asp, M.D.   Ct Angio Chest W/cm &/or Wo Cm  09/22/2011  *RADIOLOGY REPORT*  Clinical Data: Chest pain.  Short of breath.  Elevated D-dimer.  CT ANGIOGRAPHY CHEST  Technique:  Multidetector CT imaging of the chest using the standard protocol during bolus administration of intravenous contrast. Multiplanar reconstructed images including MIPs were obtained and reviewed to evaluate the vascular anatomy.  Contrast: 141mL OMNIPAQUE IOHEXOL 350 MG/ML SOLN  Comparison: Chest radiography same day  Findings: Pulmonary arterial opacification is moderate.  No sign of pulmonary emboli.  No aortic pathology.  No coronary artery calcification.  No hilar or mediastinal mass or adenopathy.  No pleural or pericardial fluid.  The lungs are clear except for minimal dependent volume loss, probably positional.  No spinal pathology.  IMPRESSION: Negative exam.  No cause of the patient's described symptoms is identified.  No pulmonary emboli.  Original Report Authenticated By: Jules Schick, M.D.    MDM  Pt without signs of PNA, or PE.  No pulm edema noted on xray.  Eval from yesterday reviewed.  She has noticed some improvement with breathing treatments.  Her body habitus may play a role as she is rather obese but overall suspect this is related to URI, congestion.   Will dc home on po decongestant and inhalers.          Kathalene Frames, MD 09/22/11 [phone]

## 2011-09-22 NOTE — ED Notes (Signed)
Patient reports that she was here yesterday and was discharged this AM at [facility]. Patient is c/o nasal congestion and having a productive cough with yellow sputum. Patient states that she had a chest x-ray yesterday and that there was no problems.

## 2011-11-29 IMAGING — US US ABDOMEN COMPLETE
1 series · 13 of 25 positions shown · non-contrast
Comparison: None

CLINICAL DATA: Nausea, vomiting, diarrhea and epigastric abdominal
pain.  History diabetes.

ABDOMEN ULTRASOUND
TECHNIQUE: Complete abdominal ultrasound examination was performed
including evaluation of the liver, gallbladder, bile ducts,
pancreas, kidneys, spleen, IVC, and abdominal aorta.

[Series 1: us abdomen complete · 0.33mm/px · 13 of 68 slices shown]
[im 1/68]
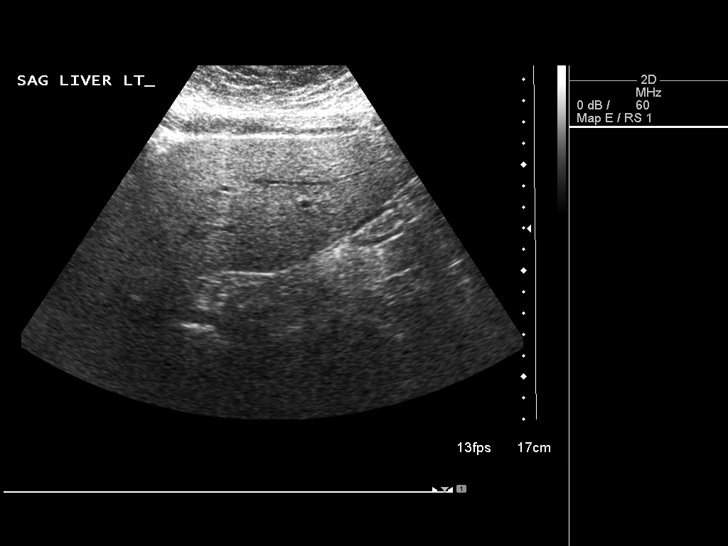
[im 6/68]
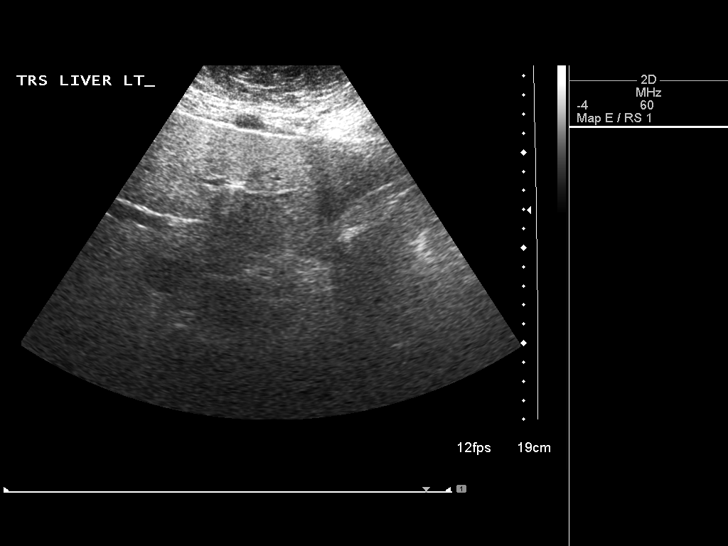
[im 12/68]
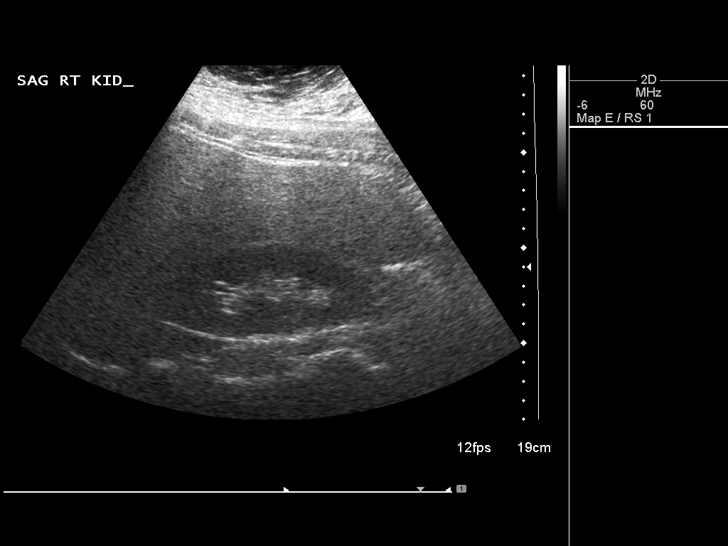
[im 17/68]
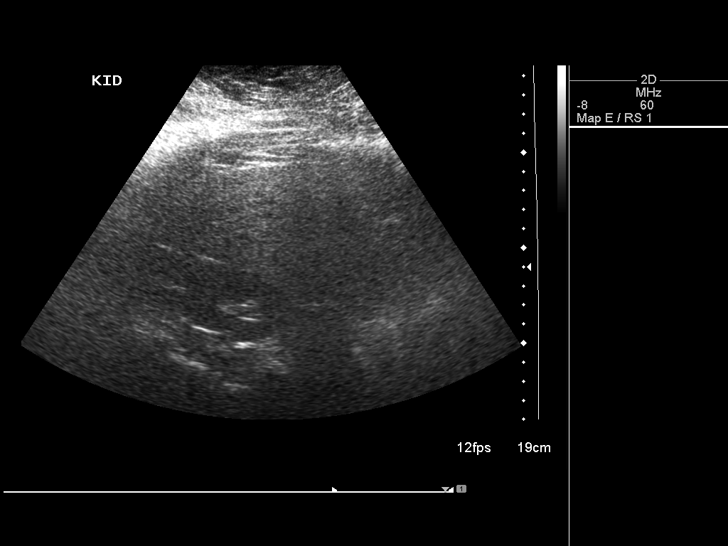
[im 23/68]
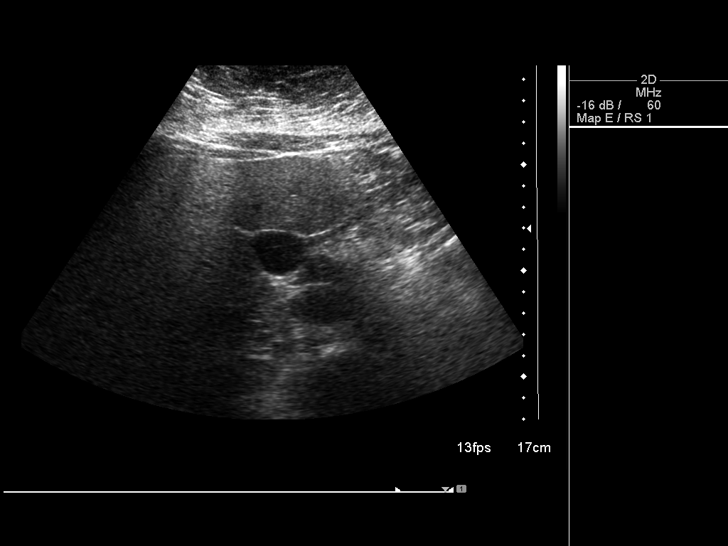
[im 28/68]
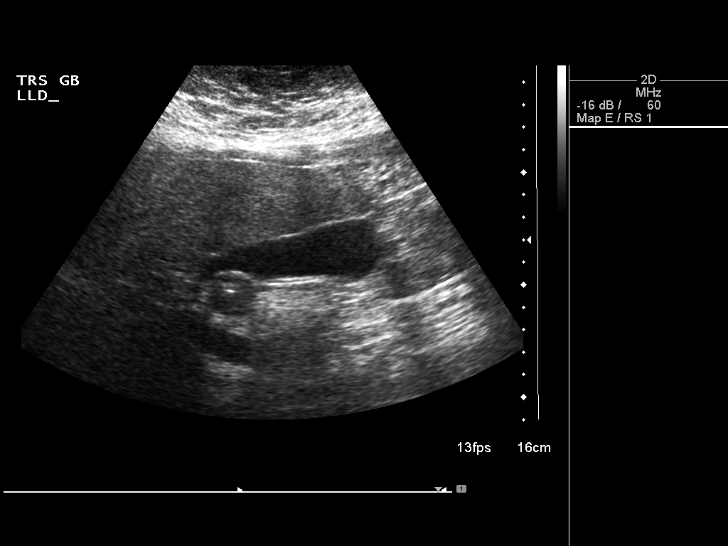
[im 34/68]
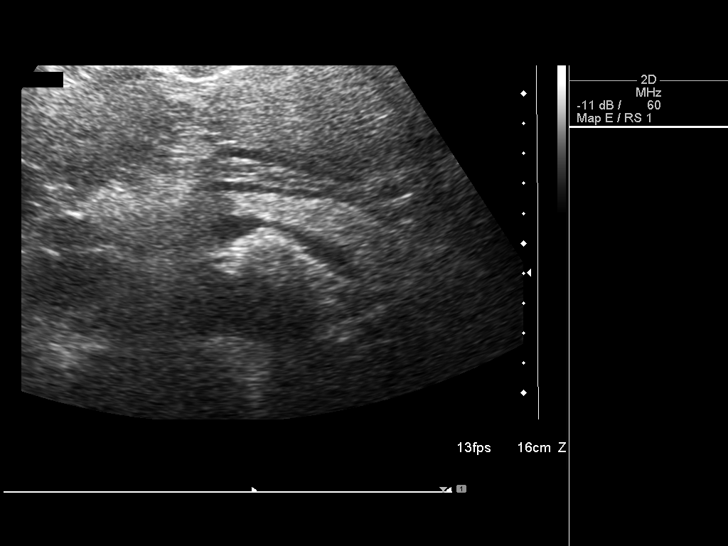
[im 40/68]
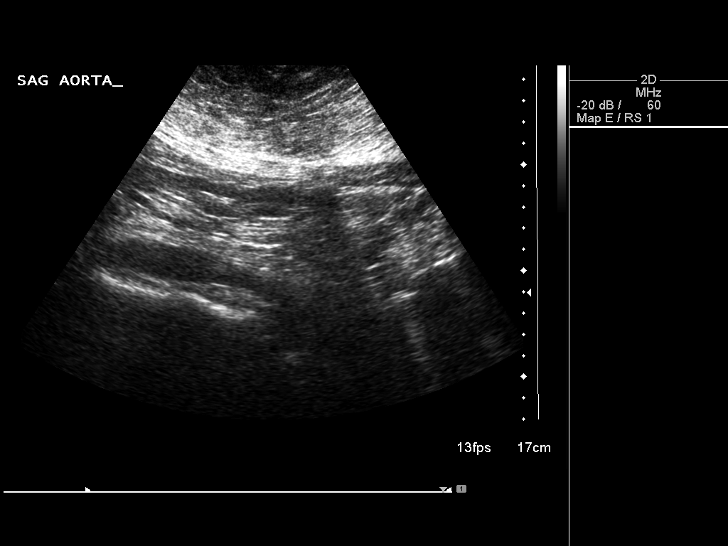
[im 45/68]
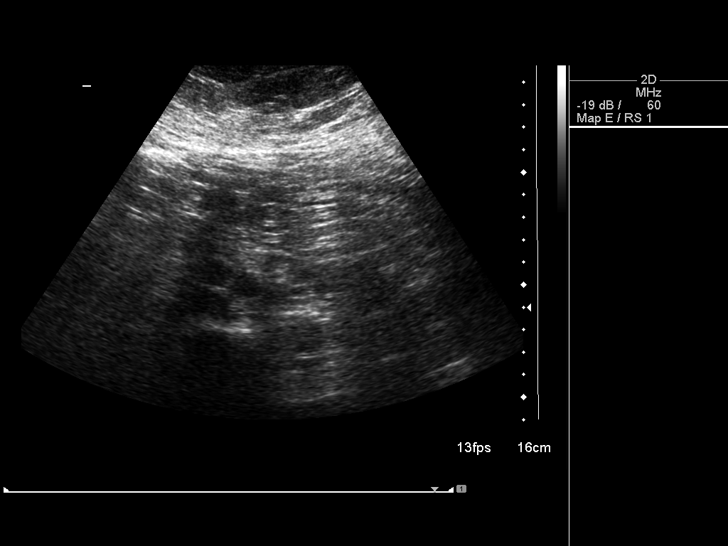
[im 51/68]
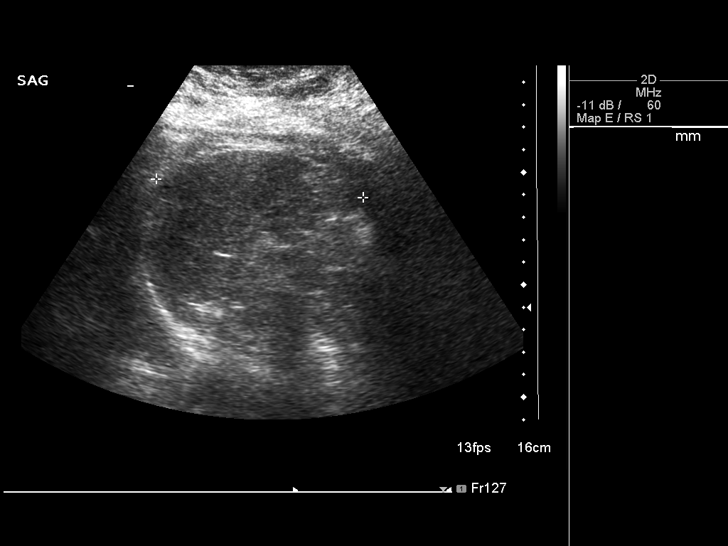
[im 56/68]
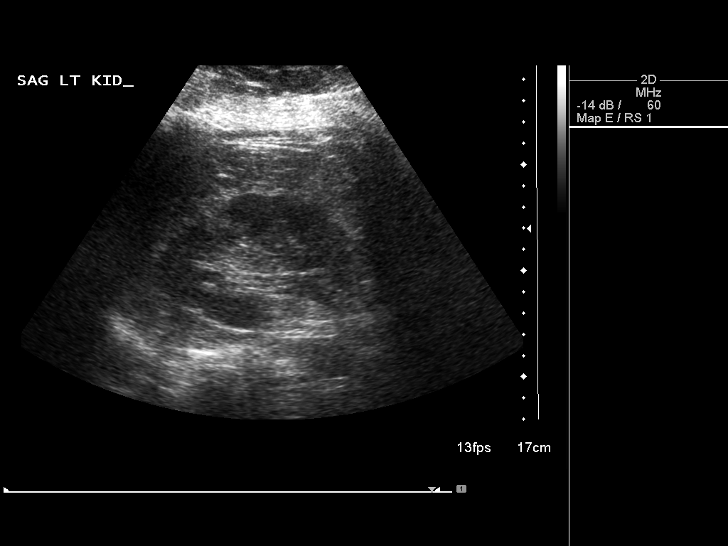
[im 62/68]
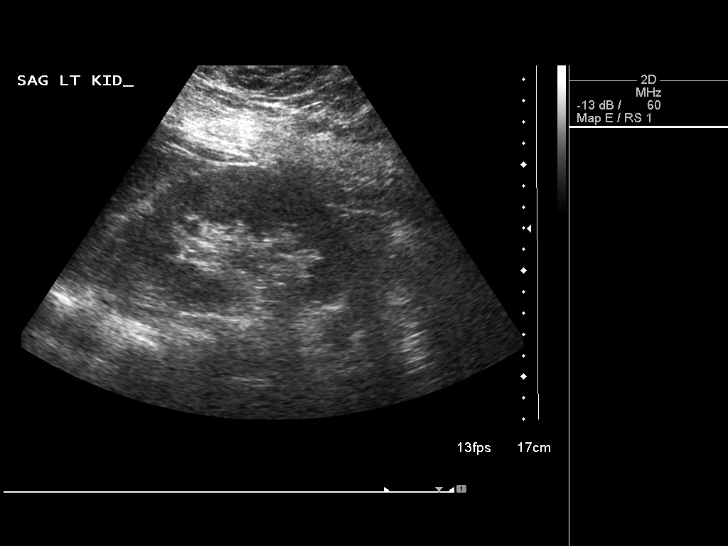
[im 68/68]
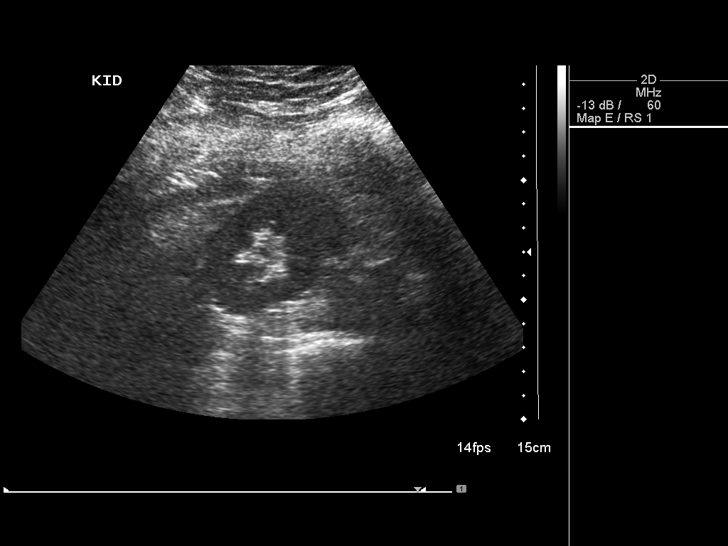

[13 of 25 positions shown; findings below may reference images not displayed]

FINDINGS: Gallbladder:  No shadowing gallstones or echogenic sludge.  No
gallbladder wall thickening or pericholecystic fluid.  Negative
sonographic Murphy's sign according to the ultrasound technologist.

Common Bile Duct:  Normal caliber of 5 mm.

Liver:  Difficult penetration of the liver due to obesity.  The
liver does appear somewhat echogenic, likely reflecting underlying
fatty infiltration.  Sensitivity in detection of hepatic masses is
greatly reduced by ultrasound.  There is no evidence of gross
intrahepatic biliary ductal dilatation.

IVC:  Patent throughout its visualized course in the abdomen.

Pancreas:  Although the pancreas is difficult to visualize in its
entirety, no focal pancreatic abnormality is identified.

Spleen:  The spleen shows normal echotexture and size.

Kidneys:  The right kidney measures 12 cm and the left kidney
cm.  Both have normal sonographic appearance.

Abdominal Aorta:  Normal in caliber throughout its visualized
course in the abdomen.
IMPRESSION: Echogenic liver likely reflecting underlying fatty infiltration.

## 2011-12-21 ENCOUNTER — Encounter (HOSPITAL_COMMUNITY): Payer: Self-pay | Admitting: Emergency Medicine

## 2011-12-21 ENCOUNTER — Emergency Department (HOSPITAL_COMMUNITY)
Admission: EM | Admit: 2011-12-21 | Discharge: 2011-12-22 | Discharge status: Against Medical Advice | Payer: Self-pay | Attending: Emergency Medicine | Admitting: Emergency Medicine

## 2011-12-21 DIAGNOSIS — J00 Acute nasopharyngitis [common cold]: Secondary | ICD-10-CM | POA: Insufficient documentation

## 2011-12-21 NOTE — ED Notes (Signed)
Pt states she has had a chest cold for about a week.  C/o nasal congestion, chest congestion, cough, chest pain, and headache.

## 2012-05-02 IMAGING — CR DG CHEST 2V
2 series · 2 of 2 positions shown · non-contrast
Comparison: 10/24/2008.

CLINICAL DATA: Difficulty breathing.  Mid chest pain.

CHEST - 2 VIEW

[w chest pa]
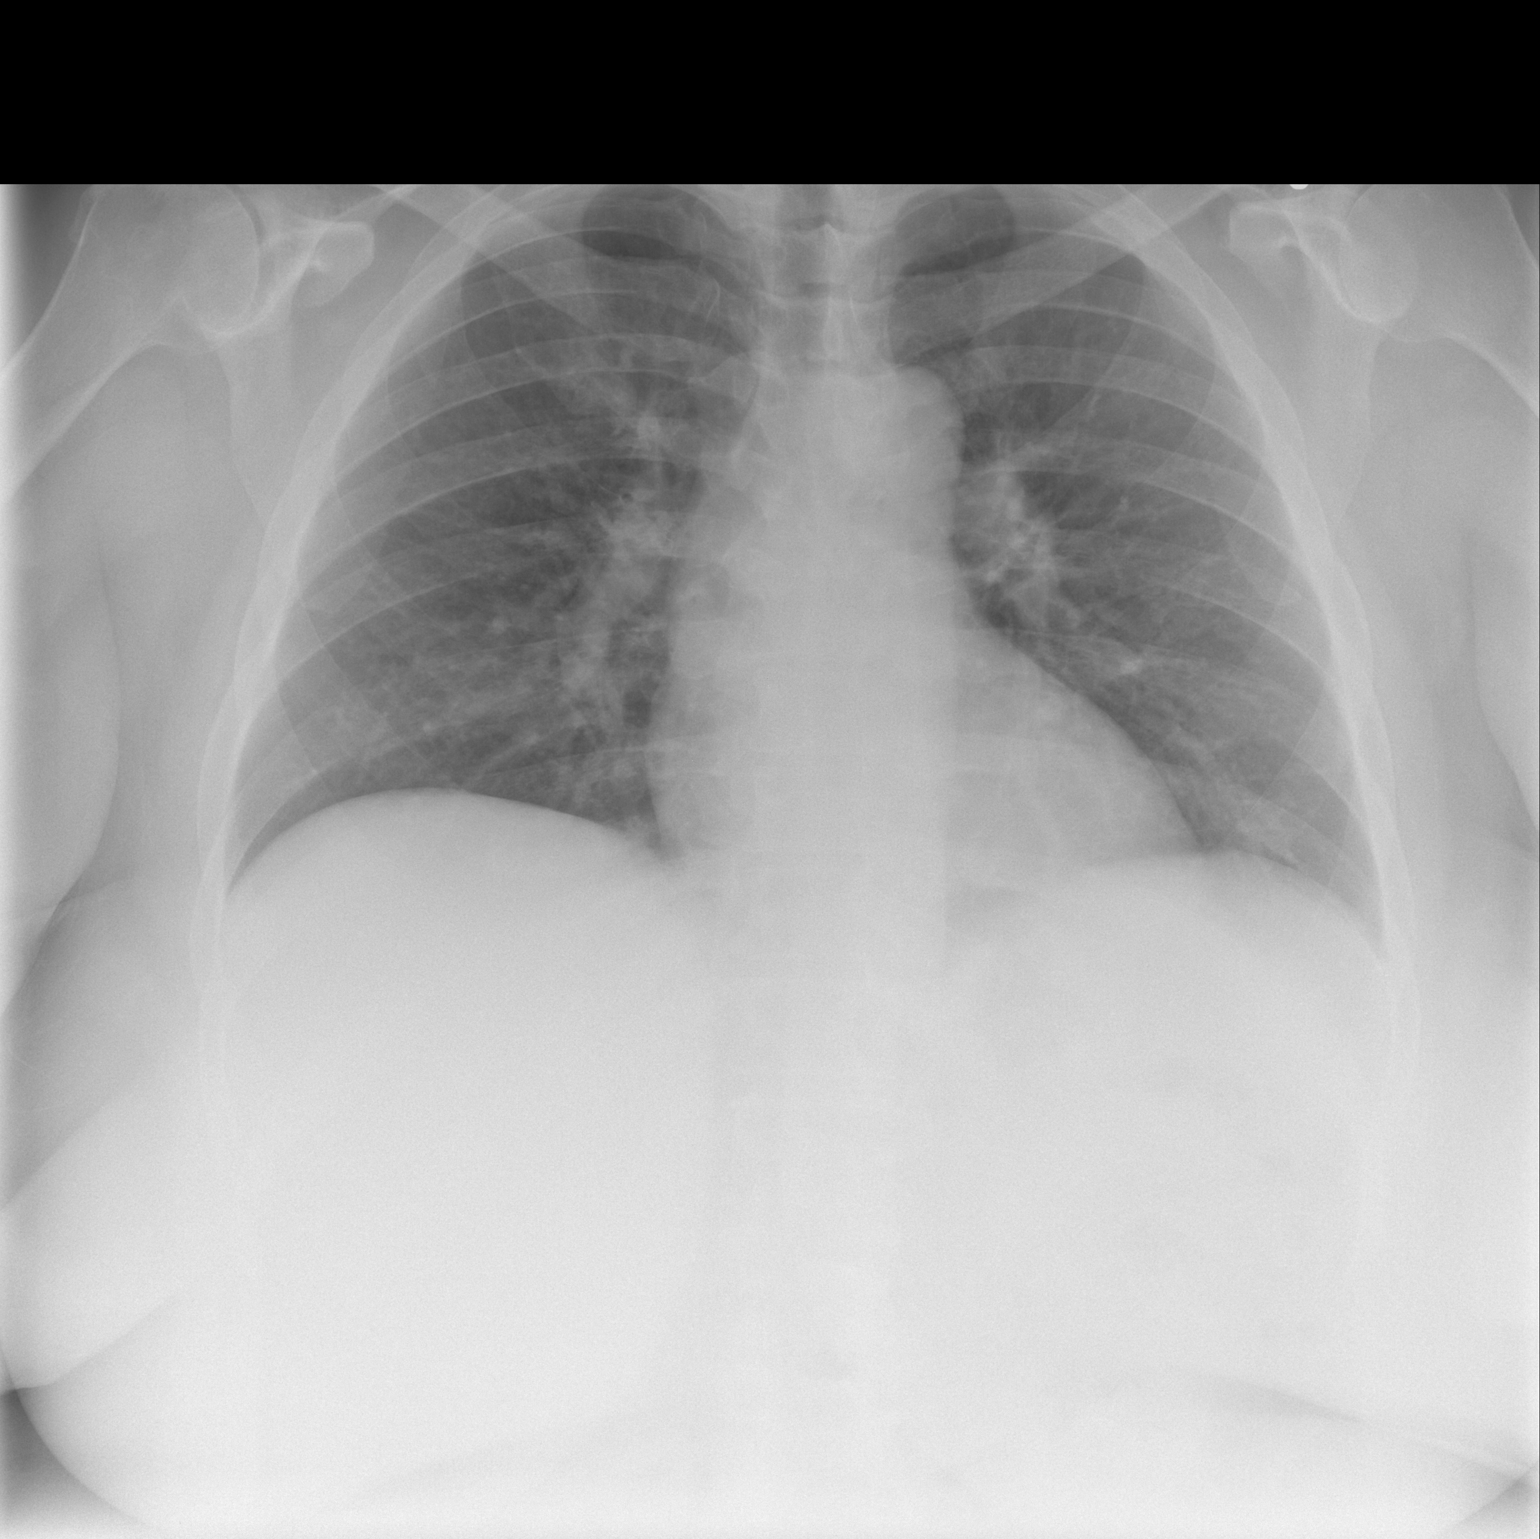

[w chest lat *]
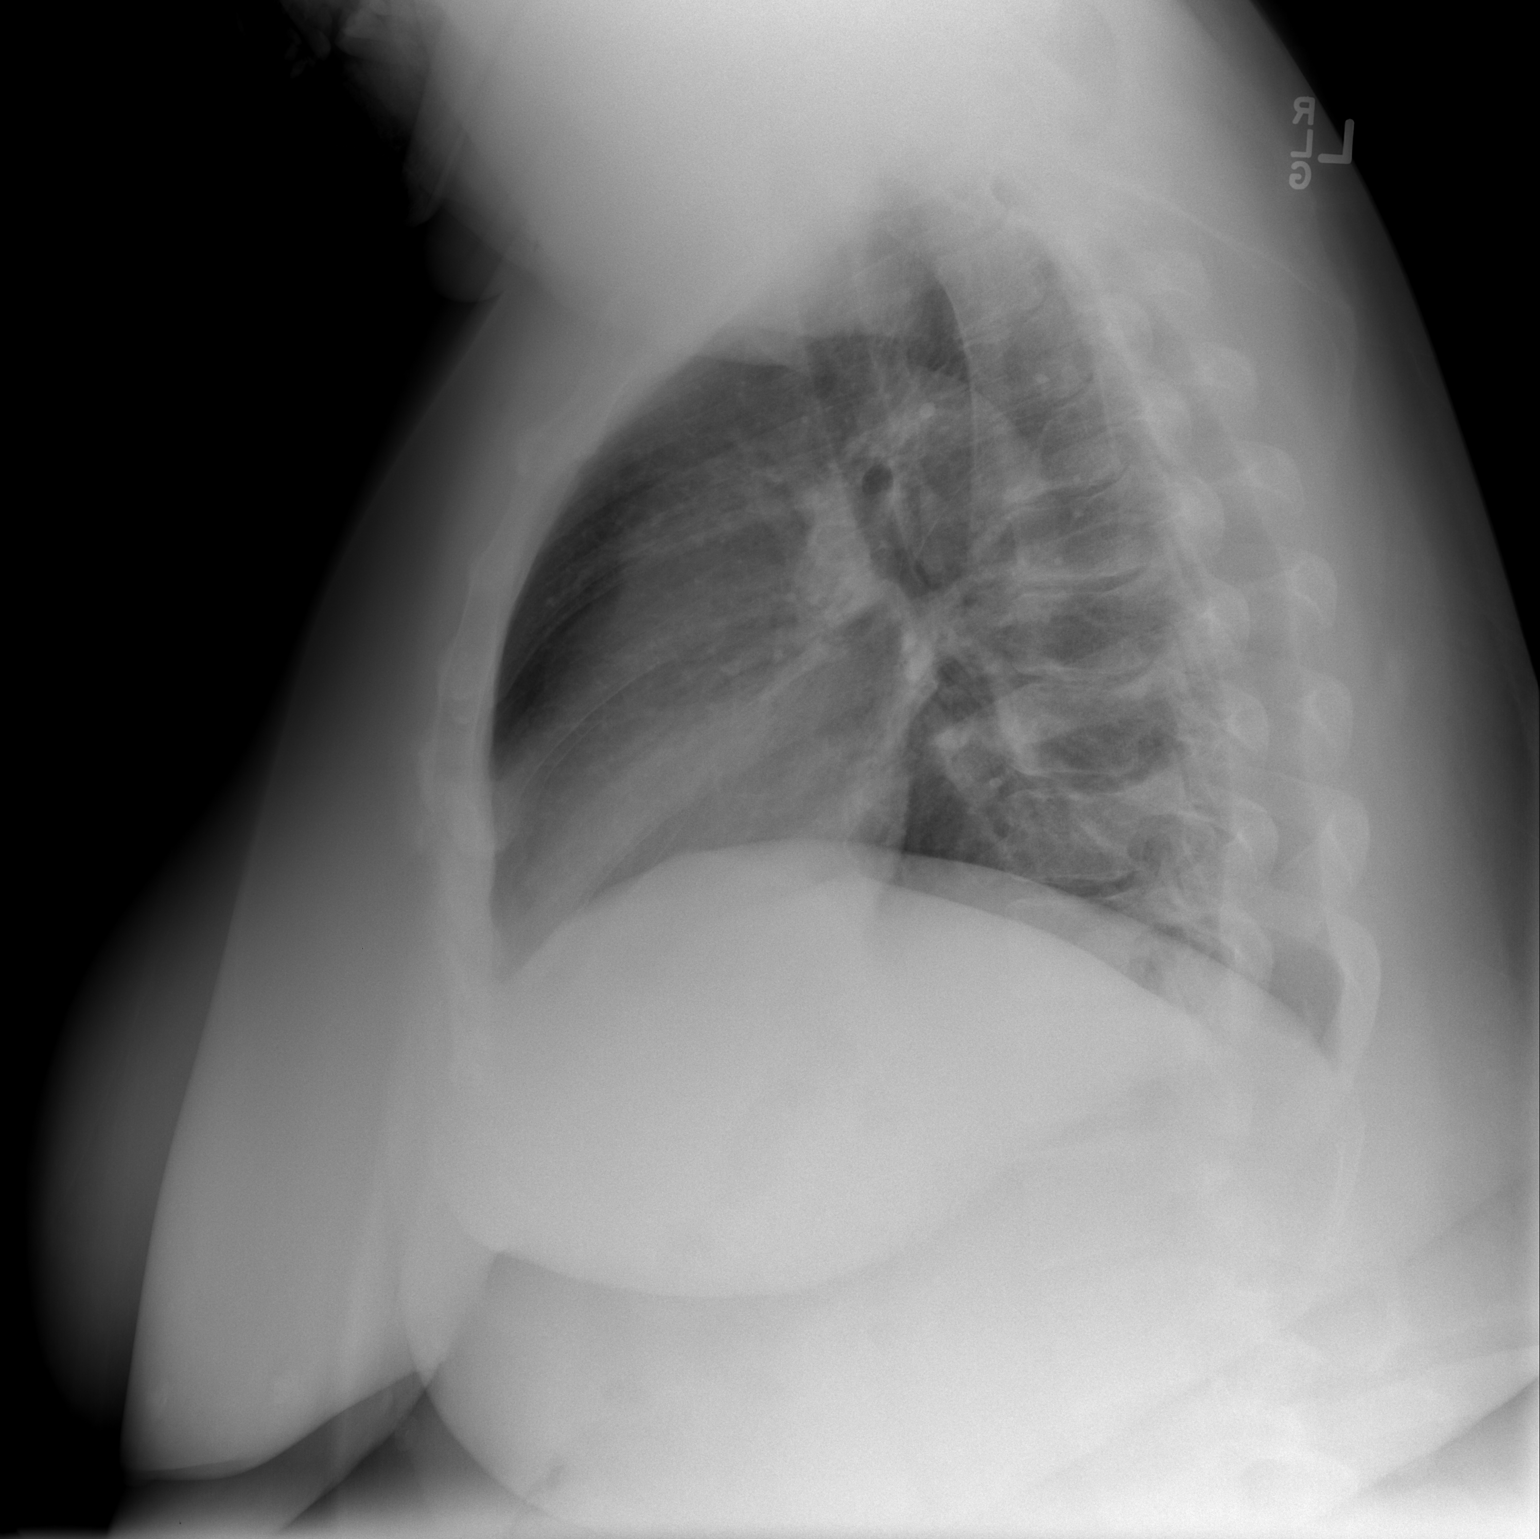

[2 of 2 positions shown; findings below may reference images not displayed]

FINDINGS: The cardiac silhouette is normal size and shape. The lungs are well
aerated and free of infiltrates.
No pleural abnormality is evident.  No mediastinal or hilar lesion
is seen. Bones appear average for age.  There is no evidence of
pneumothorax.
IMPRESSION: No cardiopulmonary disease or abnormality is seen.

## 2012-12-10 ENCOUNTER — Emergency Department (HOSPITAL_COMMUNITY): Payer: Self-pay

## 2012-12-10 ENCOUNTER — Emergency Department (HOSPITAL_COMMUNITY)
Admission: EM | Admit: 2012-12-10 | Discharge: 2012-12-11 | Disposition: A | Discharge status: Home / Self Care | Payer: Self-pay | Attending: Emergency Medicine | Admitting: Emergency Medicine

## 2012-12-10 ENCOUNTER — Encounter (HOSPITAL_COMMUNITY): Payer: Self-pay | Admitting: Emergency Medicine

## 2012-12-10 DIAGNOSIS — Z792 Long term (current) use of antibiotics: Secondary | ICD-10-CM | POA: Insufficient documentation

## 2012-12-10 DIAGNOSIS — M79609 Pain in unspecified limb: Secondary | ICD-10-CM | POA: Insufficient documentation

## 2012-12-10 DIAGNOSIS — M7989 Other specified soft tissue disorders: Secondary | ICD-10-CM | POA: Insufficient documentation

## 2012-12-10 DIAGNOSIS — Z7982 Long term (current) use of aspirin: Secondary | ICD-10-CM | POA: Insufficient documentation

## 2012-12-10 DIAGNOSIS — Z880 Allergy status to penicillin: Secondary | ICD-10-CM | POA: Insufficient documentation

## 2012-12-10 DIAGNOSIS — Z794 Long term (current) use of insulin: Secondary | ICD-10-CM | POA: Insufficient documentation

## 2012-12-10 DIAGNOSIS — I10 Essential (primary) hypertension: Secondary | ICD-10-CM | POA: Insufficient documentation

## 2012-12-10 DIAGNOSIS — Z79899 Other long term (current) drug therapy: Secondary | ICD-10-CM | POA: Insufficient documentation

## 2012-12-10 DIAGNOSIS — E119 Type 2 diabetes mellitus without complications: Secondary | ICD-10-CM | POA: Insufficient documentation

## 2012-12-10 DIAGNOSIS — Z8673 Personal history of transient ischemic attack (TIA), and cerebral infarction without residual deficits: Secondary | ICD-10-CM | POA: Insufficient documentation

## 2012-12-10 DIAGNOSIS — R0602 Shortness of breath: Secondary | ICD-10-CM | POA: Insufficient documentation

## 2012-12-10 DIAGNOSIS — M79605 Pain in left leg: Secondary | ICD-10-CM

## 2012-12-10 HISTORY — DX: Cerebral infarction, unspecified: I63.9

## 2012-12-10 LAB — POCT I-STAT, CHEM 8
BUN: 18 mg/dL (ref 6–23)
Chloride: 96 mEq/L (ref 96–112)
Creatinine, Ser: 1.3 mg/dL — ABNORMAL HIGH (ref 0.50–1.10)
Hemoglobin: 11.6 g/dL — ABNORMAL LOW (ref 12.0–15.0)
Potassium: 4.1 mEq/L (ref 3.5–5.1)
Sodium: 136 mEq/L (ref 135–145)

## 2012-12-10 MED ORDER — HYDROMORPHONE HCL PF 1 MG/ML IJ SOLN
1.0000 mg | Freq: Once | INTRAMUSCULAR | Status: AC
  Administered 2012-12-10: 1 mg via INTRAVENOUS
  Filled 2012-12-10: qty 1

## 2012-12-10 MED ORDER — IOHEXOL 350 MG/ML SOLN
100.0000 mL | Freq: Once | INTRAVENOUS | Status: AC | PRN
  Administered 2012-12-10: 100 mL via INTRAVENOUS

## 2012-12-10 MED ORDER — RIVAROXABAN 15 MG PO TABS
15.0000 mg | ORAL_TABLET | Freq: Two times a day (BID) | ORAL | Status: DC
  Administered 2012-12-11: 15 mg via ORAL
  Filled 2012-12-10 (×2): qty 1

## 2012-12-10 MED ORDER — SODIUM CHLORIDE 0.9 % IV BOLUS (SEPSIS)
1000.0000 mL | Freq: Once | INTRAVENOUS | Status: AC
  Administered 2012-12-10: 1000 mL via INTRAVENOUS

## 2012-12-10 NOTE — ED Notes (Signed)
Pt reports Hydrocodone taken at home did not relieve pain.

## 2012-12-10 NOTE — ED Notes (Signed)
PA at bedside.

## 2012-12-10 NOTE — ED Notes (Addendum)
Pt reports LLE pain and swelling since Tuesday, increasing today.  Pt has noted swelling to L foot and ankle, denies injury.  Reports pain shoots up L calf when she attempts to plantar flex her L foot. Pt reports ShOB upon exertion.

## 2012-12-10 NOTE — ED Notes (Signed)
PA with patient for assessment at this time

## 2012-12-10 NOTE — ED Notes (Signed)
Patient transported to CT 

## 2012-12-10 NOTE — ED Provider Notes (Signed)
[name]: [id number]     Arrival date & time 12/10/12  2113 History   First MD Initiated Contact with Patient 12/10/12 2203     Chief Complaint  Patient presents with  . Leg Pain  . Leg Swelling   (Consider location/radiation/quality/duration/timing/severity/associated sxs/prior Treatment) HPI Comments: Patient is a 47 year old female with history of diabetes, hypertension, stroke who presents today with left leg pain and swelling since Tuesday. She reports that the pain has been gradually worsening since that time. The pain is a pressure and sharp pain. The pain is diffusely over her left foot, ankle, calf. The pain is the worst with dorsi flexion. The pain is exacerbated when she walks. She feels like her left calf is harder than her right calf. She has never had pain like this in the past. She also gives a history of exertional shortness of breath and mild pleuritic chest pain. She is tachycardic at 133 on arrival. She denies any history of PE or DVT. No recent travel, surgeries, cough, exogenous estrogen. She denies any recent illness, fever, chills, nausea, vomiting, abdominal pain, numbness, paresthesias.  The history is provided by the patient. No language interpreter was used.    Past Medical History  Diagnosis Date  . Diabetes mellitus   . Hypertension   . Stroke 02/2013   History reviewed. No pertinent past surgical history. History reviewed. No pertinent family history. History  Substance Use Topics  . Smoking status: Never Smoker   . Smokeless tobacco: Not on file  . Alcohol Use: No   OB History   Grav Para Term Preterm Abortions TAB SAB Ect Mult Living                 Review of Systems  Constitutional: Negative for fever and chills.  Respiratory: Positive for shortness of breath. Negative for cough.   Cardiovascular: Positive for leg swelling. Negative for chest pain.  Gastrointestinal: Negative for nausea, vomiting and abdominal pain.  All other systems reviewed and are  negative.    Allergies  Valium; Lisinopril; and Penicillins  Home Medications   Current Outpatient Rx  Name  Route  Sig  Dispense  Refill  . amLODipine (NORVASC) 5 MG tablet   Oral   Take 5 mg by mouth daily.         [name] aspirin 81 MG tablet   Oral   Take 81 mg by mouth daily.         . hydrochlorothiazide (HYDRODIURIL) 25 MG tablet   Oral   Take 25 mg by mouth daily.         [name] HYDROcodone-acetaminophen (NORCO/VICODIN) 5-325 MG per tablet   Oral   Take 1-2 tablets by mouth every 6 (six) hours as needed for pain.         [name] insulin NPH-insulin regular (NOVOLIN 70/30) (70-30) 100 UNIT/ML injection   Subcutaneous   Inject 25-75 Units into the skin 3 (three) times daily. 75 units in the morning, 25 units at midday, & 75 units in the evening         . levofloxacin (LEVAQUIN) 500 MG tablet   Oral   Take 500 mg by mouth daily. Started 12/10/12 for 10 days         . EXPIRED: albuterol (PROVENTIL HFA;VENTOLIN HFA) 108 (90 BASE) MCG/ACT inhaler   Inhalation   Inhale 1-2 puffs into the lungs every 6 (six) hours as needed for wheezing.   1 Inhaler   0    BP 137/80  Pulse 133  Temp(Src) 98.4 F (36.9 C) (Oral)  Resp 20  Ht 5\' 6"  (1.676 m)  Wt 310 lb (140.615 kg)  BMI 50.06 kg/m2  SpO2 100%  LMP 10/10/2012 Physical Exam  Nursing note and vitals reviewed. Constitutional: She is oriented to person, place, and time. She appears well-developed and well-nourished. No distress.  HENT:  Head: Normocephalic and atraumatic.  Right Ear: External ear normal.  Left Ear: External ear normal.  Nose: Nose normal.  Mouth/Throat: Oropharynx is clear and moist.  Eyes: Conjunctivae are normal.  Neck: Normal range of motion.  Cardiovascular: Normal rate, regular rhythm, normal heart sounds, intact distal pulses and normal pulses.   homan's sign positive in left caft. Left calf is more swollen than right.   Pulmonary/Chest: Effort normal and breath sounds normal. No stridor. No  respiratory distress. She has no wheezes. She has no rales.  Abdominal: Soft. She exhibits no distension.  Musculoskeletal: Normal range of motion.  Tenderness to palpation diffusely over left calf, ankle, and foot. No erythema, streaking. 2+ nonpitting edema on left lower extremity.   Neurological: She is alert and oriented to person, place, and time. She has normal strength.  Skin: Skin is warm and dry. She is not diaphoretic. No erythema.  Psychiatric: She has a normal mood and affect. Her behavior is normal.    ED Course  Procedures (including critical care time) Labs Review Labs Reviewed  POCT I-STAT, CHEM 8 - Abnormal; Notable for the following:    Creatinine, Ser 1.30 (*)    Glucose, Bld 279 (*)    Hemoglobin 11.6 (*)    HCT 34.0 (*)    All other components within normal limits   Imaging Review Dg Ankle Complete Left  12/11/2012   *RADIOLOGY REPORT*  Clinical Data: Left foot and ankle swelling, no known injury  LEFT ANKLE COMPLETE - 3+ VIEW  Comparison: Concurrently obtained radiographs of the left foot  Findings: Diffuse soft tissue swelling about the ankle and foot. Degenerative changes about the medial malleolus and the talar neck suggest a secondary arthritis related to remote prior trauma.  Bony mineralization is within normal limits.  No acute fracture or malalignment.  Plantar calcaneal spurring.  Mild midfoot osteoarthritis.  IMPRESSION:  1.  Diffuse soft tissue swelling may be related to the edema, or cellulitis. 2.  No acute osseous abnormality. 3.  Secondary degenerative changes about the medial malleolus from prior remote trauma. 4. Mild midfoot osteoarthritis.   Original Report Authenticated By: Jacqulynn Cadet, M.D.   Ct Angio Chest Pe W/cm &/or Wo Cm  12/10/2012   *RADIOLOGY REPORT*  Clinical Data: [name] for 2 days.  CT ANGIOGRAPHY CHEST  Technique:  Multidetector CT imaging of the chest using the standard protocol during bolus administration of  intravenous contrast. Multiplanar reconstructed images including MIPs were obtained and reviewed to evaluate the vascular anatomy.  Contrast: 183mL OMNIPAQUE IOHEXOL 350 MG/ML SOLN  Comparison: CTA of the chest performed 09/22/2011, and chest radiograph performed 09/11/2011  Findings: There is no evidence of pulmonary embolus.  The lungs are clear bilaterally.  There is no evidence of significant focal consolidation, pleural effusion or pneumothorax. No masses are identified; no abnormal focal contrast enhancement is seen.  The mediastinum is unremarkable in appearance.  No mediastinal lymphadenopathy is seen.  No pericardial effusion is identified. The great vessels are grossly unremarkable in appearance.  No axillary lymphadenopathy is seen.  The visualized portions of the thyroid gland are unremarkable in appearance.  The visualized portions of the liver and spleen are unremarkable.  No acute osseous abnormalities are seen.  IMPRESSION: No evidence of pulmonary embolus.  Unremarkable CTA of the chest.   Original Report Authenticated By: Santa Lighter, M.D.   Dg Foot Complete Left  12/11/2012   *RADIOLOGY REPORT*  Clinical Data: Pain and swelling  LEFT FOOT - COMPLETE 3+ VIEW  Comparison: Left ankle radiograph December 11, 2012  Findings: No acute fracture deformity nor dislocation.  Type 2 navicular bone.  Mild osteophytic spurring about the mid foot.  5 mm calcaneal spur.  Mild spurring of the tibiotalar joint space. Diffuse soft tissue swelling without subcutaneous gas, radiopaque foreign bodies or pathologic calcifications.  IMPRESSION: Diffuse swelling could reflect cellulitis, no subcutaneous gas.  No fracture deformity or dislocation.  Mild tibiotalar and mid foot osteoarthrosis.   Original Report Authenticated By: Elon Alas    MDM   1. Left leg pain    Patient presents with left leg pain and shortness of breath. CTA was negative for pulmonary embolism. XR negative for fracture, dislocation.  Neurovascularly intact. Compartment soft. I am clinically quite concerned for DVT. She was anticoagulated in the emergency department with Xarelto. Discussed that the patient must come back to the emergency department for her vascular ultrasound tomorrow morning. At that time she will be directed on how to get to her ultrasound. Strict return instructions were given. Vital signs stable for discharge. Discussed case with Dr. Stevie Kern who agrees with plan. Patient / Family / Caregiver informed of clinical course, understand medical decision-making process, and agree with plan.  Elwyn Lade, PA-C 12/11/12 [phone]

## 2012-12-11 ENCOUNTER — Emergency Department (HOSPITAL_COMMUNITY): Payer: Self-pay

## 2012-12-11 ENCOUNTER — Ambulatory Visit (HOSPITAL_COMMUNITY)
Admission: RE | Admit: 2012-12-11 | Discharge: 2012-12-11 | Disposition: A | Discharge status: Home / Self Care | Payer: Self-pay | Source: Ambulatory Visit

## 2012-12-11 DIAGNOSIS — M79609 Pain in unspecified limb: Secondary | ICD-10-CM

## 2012-12-11 MED ORDER — OXYCODONE-ACETAMINOPHEN 5-325 MG PO TABS: 2.0000 | ORAL_TABLET | Freq: Four times a day (QID) | ORAL | Status: DC | PRN

## 2012-12-11 MED ORDER — PROMETHAZINE HCL 25 MG PO TABS: 25.0000 mg | ORAL_TABLET | Freq: Four times a day (QID) | ORAL | Status: DC | PRN

## 2012-12-11 NOTE — ED Notes (Signed)
Returned from  X-ray

## 2012-12-11 NOTE — Progress Notes (Signed)
VASCULAR LAB PRELIMINARY  PRELIMINARY  PRELIMINARY  PRELIMINARY  Left lower extremity venous Doppler completed.    Preliminary report:  There is no DVT or SVT noted in the left lower extremity.  Kalon Erhardt, RVT 12/11/2012, 11:04 AM

## 2012-12-11 NOTE — ED Notes (Signed)
Patient transported to X-ray 

## 2012-12-14 NOTE — ED Provider Notes (Signed)
Medical screening examination/treatment/procedure(s) were performed by non-physician practitioner and as supervising physician I was immediately available for consultation/collaboration.  Babette Relic, MD 12/14/12 2115

## 2013-03-05 ENCOUNTER — Encounter (HOSPITAL_COMMUNITY): Payer: Self-pay | Admitting: Emergency Medicine

## 2013-03-05 ENCOUNTER — Emergency Department (HOSPITAL_COMMUNITY): Payer: Self-pay

## 2013-03-05 ENCOUNTER — Emergency Department (HOSPITAL_COMMUNITY)
Admission: EM | Admit: 2013-03-05 | Discharge: 2013-03-05 | Disposition: A | Discharge status: Home / Self Care | Payer: Self-pay | Attending: Emergency Medicine | Admitting: Emergency Medicine

## 2013-03-05 DIAGNOSIS — Z794 Long term (current) use of insulin: Secondary | ICD-10-CM | POA: Insufficient documentation

## 2013-03-05 DIAGNOSIS — Z79899 Other long term (current) drug therapy: Secondary | ICD-10-CM | POA: Insufficient documentation

## 2013-03-05 DIAGNOSIS — Z880 Allergy status to penicillin: Secondary | ICD-10-CM | POA: Insufficient documentation

## 2013-03-05 DIAGNOSIS — R0602 Shortness of breath: Secondary | ICD-10-CM | POA: Insufficient documentation

## 2013-03-05 DIAGNOSIS — R51 Headache: Secondary | ICD-10-CM | POA: Insufficient documentation

## 2013-03-05 DIAGNOSIS — B349 Viral infection, unspecified: Secondary | ICD-10-CM

## 2013-03-05 DIAGNOSIS — Z3202 Encounter for pregnancy test, result negative: Secondary | ICD-10-CM | POA: Insufficient documentation

## 2013-03-05 DIAGNOSIS — B9789 Other viral agents as the cause of diseases classified elsewhere: Secondary | ICD-10-CM | POA: Insufficient documentation

## 2013-03-05 DIAGNOSIS — Z7982 Long term (current) use of aspirin: Secondary | ICD-10-CM | POA: Insufficient documentation

## 2013-03-05 DIAGNOSIS — IMO0001 Reserved for inherently not codable concepts without codable children: Secondary | ICD-10-CM | POA: Insufficient documentation

## 2013-03-05 DIAGNOSIS — R05 Cough: Secondary | ICD-10-CM | POA: Insufficient documentation

## 2013-03-05 DIAGNOSIS — R0789 Other chest pain: Secondary | ICD-10-CM | POA: Insufficient documentation

## 2013-03-05 DIAGNOSIS — E119 Type 2 diabetes mellitus without complications: Secondary | ICD-10-CM | POA: Insufficient documentation

## 2013-03-05 DIAGNOSIS — R079 Chest pain, unspecified: Secondary | ICD-10-CM

## 2013-03-05 DIAGNOSIS — R059 Cough, unspecified: Secondary | ICD-10-CM | POA: Insufficient documentation

## 2013-03-05 DIAGNOSIS — I10 Essential (primary) hypertension: Secondary | ICD-10-CM | POA: Insufficient documentation

## 2013-03-05 DIAGNOSIS — Z8673 Personal history of transient ischemic attack (TIA), and cerebral infarction without residual deficits: Secondary | ICD-10-CM | POA: Insufficient documentation

## 2013-03-05 DIAGNOSIS — J3489 Other specified disorders of nose and nasal sinuses: Secondary | ICD-10-CM | POA: Insufficient documentation

## 2013-03-05 DIAGNOSIS — R509 Fever, unspecified: Secondary | ICD-10-CM | POA: Insufficient documentation

## 2013-03-05 LAB — TROPONIN I: Troponin I: <0.3 ng/mL (ref <0.30)

## 2013-03-05 LAB — CBC WITH DIFFERENTIAL/PLATELET
Basophils Absolute: 0.1 10*3/uL (ref 0.0–0.1)
Eosinophils Absolute: 0.4 10*3/uL (ref 0.0–0.7)
Lymphocytes Relative: 36 % (ref 12–46)
Lymphs Abs: 2.9 10*3/uL (ref 0.7–4.0)
MCHC: 33 g/dL (ref 30.0–36.0)
Monocytes Relative: 7 % (ref 3–12)
Neutro Abs: 4.3 10*3/uL (ref 1.7–7.7)
Neutrophils Relative %: 52 % (ref 43–77)
Platelets: 315 10*3/uL (ref 150–400)
RBC: 3.9 MIL/uL (ref 3.87–5.11)
RDW: 13.8 % (ref 11.5–15.5)
WBC: 8.1 10*3/uL (ref 4.0–10.5)

## 2013-03-05 LAB — URINALYSIS, ROUTINE W REFLEX MICROSCOPIC
Glucose, UA: NEGATIVE mg/dL
Leukocytes, UA: NEGATIVE
Protein, ur: NEGATIVE mg/dL
Specific Gravity, Urine: 1.013 (ref 1.005–1.030)
pH: 7 (ref 5.0–8.0)

## 2013-03-05 LAB — INFLUENZA PANEL BY PCR (TYPE A & B)
H1N1 flu by pcr: NOT DETECTED
Influenza A By PCR: NEGATIVE

## 2013-03-05 LAB — BASIC METABOLIC PANEL
BUN: 14 mg/dL (ref 6–23)
CO2: 25 mEq/L (ref 19–32)
Calcium: 9.5 mg/dL (ref 8.4–10.5)
Chloride: 99 mEq/L (ref 96–112)
GFR calc non Af Amer: 73 mL/min — ABNORMAL LOW (ref >90)
Glucose, Bld: 162 mg/dL — ABNORMAL HIGH (ref 70–99)
Potassium: 3.6 mEq/L (ref 3.5–5.1)
Sodium: 135 mEq/L (ref 135–145)

## 2013-03-05 LAB — PRO B NATRIURETIC PEPTIDE: Pro B Natriuretic peptide (BNP): 47 pg/mL (ref 0–125)

## 2013-03-05 MED ORDER — MORPHINE SULFATE 4 MG/ML IJ SOLN
4.0000 mg | Freq: Once | INTRAMUSCULAR | Status: AC
  Administered 2013-03-05: 4 mg via INTRAVENOUS
  Filled 2013-03-05: qty 1

## 2013-03-05 MED ORDER — KETOROLAC TROMETHAMINE 30 MG/ML IJ SOLN
30.0000 mg | Freq: Once | INTRAMUSCULAR | Status: AC
  Administered 2013-03-05: 30 mg via INTRAVENOUS
  Filled 2013-03-05: qty 1

## 2013-03-05 MED ORDER — HYDROCHLOROTHIAZIDE 25 MG PO TABS: 25.0000 mg | ORAL_TABLET | Freq: Every day | ORAL | Status: DC

## 2013-03-05 MED ORDER — SODIUM CHLORIDE 0.9 % IV BOLUS (SEPSIS)
1000.0000 mL | Freq: Once | INTRAVENOUS | Status: AC
  Administered 2013-03-05: 1000 mL via INTRAVENOUS

## 2013-03-05 NOTE — ED Notes (Signed)
MD at bedside. 

## 2013-03-05 NOTE — Progress Notes (Signed)
[facility] provided pt with a list of primary care resources, [facility] [employer], and [facility] information.

## 2013-03-05 NOTE — ED Provider Notes (Signed)
TIME SEEN: 8:11 AM  CHIEF COMPLAINT: Chest pain, headache, nasal congestion, shortness of breath, cough, headache, subjective fever, myalgias  HPI: Patient is a 47 year old female with a history of hypertension, diabetes, prior stroke who presents to the emergency department with one week of gradual onset, diffuse headache, nasal congestion, dry cough, left-sided sharp and achy chest pain has been constant for one week, subjective fevers and chills, body aches. Denies any sick contacts, recent travel, hospitalization. No history of PE or DVT. Patient recently had a negative CTA of her chest 12/10/2012. She denies any recent prolonged immobilization, fracture, trauma, surgery, exogenous hormone use or tobacco use, history of cancer. She states her last stress test was many years ago. No history of MI or cardiac catheterization. She denies any vomiting or diarrhea.  ROS: See HPI Constitutional:  fever  Eyes: no drainage  ENT:  runny nose   Cardiovascular:   chest pain  Resp:  SOB  GI: no vomiting GU: no dysuria Integumentary: no rash  Allergy: no hives  Musculoskeletal: no leg swelling  Neurological: no slurred speech ROS otherwise negative  PAST MEDICAL HISTORY/PAST SURGICAL HISTORY:  Past Medical History  Diagnosis Date  . Diabetes mellitus   . Hypertension   . Stroke 02/2013    MEDICATIONS:  Prior to Admission medications   Medication Sig Start Date End Date Taking? Authorizing Provider  albuterol (PROVENTIL HFA;VENTOLIN HFA) 108 (90 BASE) MCG/ACT inhaler Inhale 1-2 puffs into the lungs every 6 (six) hours as needed for wheezing. 09/22/11 09/21/12  Kathalene Frames, MD  amLODipine (NORVASC) 5 MG tablet Take 5 mg by mouth daily. 09/21/11   Blanchie Dessert, MD  aspirin 81 MG tablet Take 81 mg by mouth daily.    Historical Provider, MD  hydrochlorothiazide (HYDRODIURIL) 25 MG tablet Take 25 mg by mouth daily.    Historical Provider, MD  HYDROcodone-acetaminophen (NORCO/VICODIN) 5-325 MG per  tablet Take 1-2 tablets by mouth every 6 (six) hours as needed for pain.    Historical Provider, MD  insulin NPH-insulin regular (NOVOLIN 70/30) (70-30) 100 UNIT/ML injection Inject 25-75 Units into the skin 3 (three) times daily. 75 units in the morning, 25 units at midday, & 75 units in the evening 09/21/11   Blanchie Dessert, MD  levofloxacin (LEVAQUIN) 500 MG tablet Take 500 mg by mouth daily. Started 12/10/12 for 10 days    Historical Provider, MD  oxyCODONE-acetaminophen (PERCOCET/ROXICET) 5-325 MG per tablet Take 2 tablets by mouth every 6 (six) hours as needed for pain. 12/11/12   Elwyn Lade, PA-C  promethazine (PHENERGAN) 25 MG tablet Take 1 tablet (25 mg total) by mouth every 6 (six) hours as needed for nausea. 12/11/12   Elwyn Lade, PA-C    ALLERGIES:  Allergies  Allergen Reactions  . Valium [Diazepam] [name]  . Lisinopril Swelling    Tongue and mouth  . Penicillins Palpitations    SOCIAL HISTORY:  History  Substance Use Topics  . Smoking status: Never Smoker   . Smokeless tobacco: Never Used  . Alcohol Use: No    FAMILY HISTORY: History reviewed. No pertinent family history.  EXAM: BP 139/90  Pulse 96  Temp(Src) 97.7 F (36.5 C) (Oral)  Resp 24  SpO2 100% CONSTITUTIONAL: Alert and oriented and responds appropriately to questions. Well-appearing; well-nourished HEAD: Normocephalic EYES: Conjunctivae clear, PERRL ENT: normal nose; nasal congestion, no rhinorrhea; moist mucous membranes; pharynx without lesions noted NECK: Supple, no meningismus, no LAD; no JVD CARD: RRR; S1 and S2  appreciated; no murmurs, no clicks, no rubs, no gallops RESP: Normal chest excursion without splinting or tachypnea; breath sounds clear and equal bilaterally; no wheezes, no rhonchi, there are mild crackles at bases bilaterally ABD/GI: Normal bowel sounds; non-distended; soft, non-tender, no rebound, no guarding BACK:  The back appears normal and is non-tender to  palpation, there is no CVA tenderness EXT: Normal ROM in all joints; non-tender to palpation; no edema; normal capillary refill; no cyanosis    SKIN: Normal color for age and race; warm NEURO: Moves all extremities equally PSYCH: The patient's mood and manner are appropriate. Grooming and personal hygiene are appropriate.  MEDICAL DECISION MAKING: Patient with viral-like symptoms. Given her chest pain and shortness of breath and risk factors for ACS other symptoms have been constant for one week, will check EKG, cardiac labs and chest x-ray. Will give Toradol for symptomatic relief. Anticipate if workup is negative, outpatient followup.  ED PROGRESS: Patient's labs are reassuring. Chest x-ray negative. Troponin negative. Given patient has had constant pain for one week in her left chest, do not feel she needs a second set of enzymes given her EKG and first troponin are unremarkable. Influenza swab pending. Patient reports feeling somewhat better after IV fluids and Toradol but is still complaining of pain. Will give morphine and reassess.   1:01 PM  Flu swab negative but I still full patient has viral syndrome. We'll discharge home with return precautions and supportive care instructions. Patient is also asking for a refill her hydrochlorothiazide 25 mg once daily. Will have her followup with a primary care physician. Have given strict return precautions. Patient and husband at bedside verbalize understanding and are comfortable with plan.    Date: 03/05/2013 8:06 AM  Rate: 93  Rhythm: normal sinus rhythm  QRS Axis: normal  Intervals: normal  ST/T Wave abnormalities: normal  Conduction Disutrbances: none  Narrative Interpretation: unremarkable; unchanged compared to prior EKG in October 2013         [place], DO 03/05/13 1301

## 2013-03-05 NOTE — ED Notes (Addendum)
Pt c/o intermittent, central chest pain radiating to L chest, SOB, and nasal congestion x 1 week and headache x 3 days.  Pain score 9/10 and 10/10, respectively.  Pt is able to speak in full sentences.

## 2013-07-19 ENCOUNTER — Emergency Department (HOSPITAL_COMMUNITY)
Admission: EM | Admit: 2013-07-19 | Discharge: 2013-07-19 | Disposition: A | Discharge status: Home / Self Care | Payer: Self-pay | Attending: Emergency Medicine | Admitting: Emergency Medicine

## 2013-07-19 ENCOUNTER — Encounter (HOSPITAL_COMMUNITY): Payer: Self-pay | Admitting: Emergency Medicine

## 2013-07-19 ENCOUNTER — Emergency Department (HOSPITAL_COMMUNITY): Payer: Self-pay

## 2013-07-19 ENCOUNTER — Other Ambulatory Visit: Payer: Self-pay

## 2013-07-19 DIAGNOSIS — R609 Edema, unspecified: Secondary | ICD-10-CM | POA: Insufficient documentation

## 2013-07-19 DIAGNOSIS — Z79899 Other long term (current) drug therapy: Secondary | ICD-10-CM | POA: Insufficient documentation

## 2013-07-19 DIAGNOSIS — R000 Tachycardia, unspecified: Secondary | ICD-10-CM | POA: Insufficient documentation

## 2013-07-19 DIAGNOSIS — Z8673 Personal history of transient ischemic attack (TIA), and cerebral infarction without residual deficits: Secondary | ICD-10-CM | POA: Insufficient documentation

## 2013-07-19 DIAGNOSIS — Z880 Allergy status to penicillin: Secondary | ICD-10-CM | POA: Insufficient documentation

## 2013-07-19 DIAGNOSIS — M7989 Other specified soft tissue disorders: Secondary | ICD-10-CM | POA: Insufficient documentation

## 2013-07-19 DIAGNOSIS — M2550 Pain in unspecified joint: Secondary | ICD-10-CM | POA: Insufficient documentation

## 2013-07-19 DIAGNOSIS — R079 Chest pain, unspecified: Secondary | ICD-10-CM

## 2013-07-19 DIAGNOSIS — R5381 Other malaise: Secondary | ICD-10-CM | POA: Insufficient documentation

## 2013-07-19 DIAGNOSIS — F411 Generalized anxiety disorder: Secondary | ICD-10-CM | POA: Insufficient documentation

## 2013-07-19 DIAGNOSIS — R739 Hyperglycemia, unspecified: Secondary | ICD-10-CM

## 2013-07-19 DIAGNOSIS — R5383 Other fatigue: Secondary | ICD-10-CM

## 2013-07-19 DIAGNOSIS — I10 Essential (primary) hypertension: Secondary | ICD-10-CM | POA: Insufficient documentation

## 2013-07-19 DIAGNOSIS — E119 Type 2 diabetes mellitus without complications: Secondary | ICD-10-CM | POA: Insufficient documentation

## 2013-07-19 DIAGNOSIS — R072 Precordial pain: Secondary | ICD-10-CM | POA: Insufficient documentation

## 2013-07-19 DIAGNOSIS — Z7982 Long term (current) use of aspirin: Secondary | ICD-10-CM | POA: Insufficient documentation

## 2013-07-19 DIAGNOSIS — E669 Obesity, unspecified: Secondary | ICD-10-CM | POA: Insufficient documentation

## 2013-07-19 DIAGNOSIS — R0602 Shortness of breath: Secondary | ICD-10-CM | POA: Insufficient documentation

## 2013-07-19 DIAGNOSIS — Z794 Long term (current) use of insulin: Secondary | ICD-10-CM | POA: Insufficient documentation

## 2013-07-19 LAB — BASIC METABOLIC PANEL
BUN: 14 mg/dL (ref 6–23)
CALCIUM: 9.6 mg/dL (ref 8.4–10.5)
CO2: 27 meq/L (ref 19–32)
CREATININE: 0.93 mg/dL (ref 0.50–1.10)
Chloride: 94 mEq/L — ABNORMAL LOW (ref 96–112)
GFR calc non Af Amer: 71 mL/min — ABNORMAL LOW (ref >90)
GFR, EST AFRICAN AMERICAN: 82 mL/min — AB (ref >90)
Glucose, Bld: 357 mg/dL — ABNORMAL HIGH (ref 70–99)
Potassium: 3.5 mEq/L — ABNORMAL LOW (ref 3.7–5.3)
Sodium: 135 mEq/L — ABNORMAL LOW (ref 137–147)

## 2013-07-19 LAB — CBC
HCT: 34.2 % — ABNORMAL LOW (ref 36.0–46.0)
Hemoglobin: 11.1 g/dL — ABNORMAL LOW (ref 12.0–15.0)
MCH: 28.5 pg (ref 26.0–34.0)
MCHC: 32.5 g/dL (ref 30.0–36.0)
MCV: 87.7 fL (ref 78.0–100.0)
PLATELETS: 339 10*3/uL (ref 150–400)
RBC: 3.9 MIL/uL (ref 3.87–5.11)
RDW: 13.3 % (ref 11.5–15.5)
WBC: 11.4 10*3/uL — AB (ref 4.0–10.5)

## 2013-07-19 LAB — PRO B NATRIURETIC PEPTIDE: Pro B Natriuretic peptide (BNP): 45 pg/mL (ref 0–125)

## 2013-07-19 LAB — I-STAT TROPONIN, ED: TROPONIN I, POC: 0 ng/mL (ref 0.00–0.08)

## 2013-07-19 LAB — CBG MONITORING, ED: GLUCOSE-CAPILLARY: 337 mg/dL — AB (ref 70–99)

## 2013-07-19 LAB — D-DIMER, QUANTITATIVE: D-Dimer, Quant: <0.27 ug/mL-FEU (ref 0.00–0.48)

## 2013-07-19 MED ORDER — HYDROCODONE-ACETAMINOPHEN 5-325 MG PO TABS
1.0000 | ORAL_TABLET | ORAL | Status: DC | PRN
Start: 2013-07-19 — End: 2013-10-02

## 2013-07-19 MED ORDER — ACETAMINOPHEN 325 MG PO TABS
650.0000 mg | ORAL_TABLET | Freq: Once | ORAL | Status: AC
  Administered 2013-07-19: 650 mg via ORAL
  Filled 2013-07-19: qty 2

## 2013-07-19 MED ORDER — LORAZEPAM 2 MG/ML IJ SOLN
1.0000 mg | Freq: Once | INTRAMUSCULAR | Status: AC
  Administered 2013-07-19: 1 mg via INTRAVENOUS
  Filled 2013-07-19: qty 1

## 2013-07-19 MED ORDER — INSULIN NPH ISOPHANE & REGULAR (70-30) 100 UNIT/ML ~~LOC~~ SUSP: 25.0000 [IU] | Freq: Three times a day (TID) | SUBCUTANEOUS | Status: DC

## 2013-07-19 MED ORDER — HYDROCHLOROTHIAZIDE 25 MG PO TABS: 25.0000 mg | ORAL_TABLET | Freq: Every day | ORAL | Status: DC

## 2013-07-19 MED ORDER — AMLODIPINE BESYLATE 5 MG PO TABS: 5.0000 mg | ORAL_TABLET | Freq: Every day | ORAL | Status: DC

## 2013-07-19 NOTE — ED Notes (Signed)
Patient transported to X-ray 

## 2013-07-19 NOTE — ED Notes (Signed)
EKG not exporting into system. RN made aware

## 2013-07-19 NOTE — ED Provider Notes (Signed)
[name]: [id number]     Arrival date & time 07/19/13  1928 History   First MD Initiated Contact with Patient 07/19/13 1952     Chief Complaint  Patient presents with  . Chest Pain  . [name]  . Leg Swelling     (Consider location/radiation/quality/duration/timing/severity/associated sxs/prior Treatment) HPI Comments: 48 year old female past medical history of hypertension and diabetes presents to the emergency department complaining of shortness of breath x4 days. [name] both at rest and on exertion. Over the past 4 days she began having lower extremity edema and severe body aches. Admits to associated midsternal chest pain which has been constant since this morning described as a tightness. No aggravating or alleviating factors. She is feeling very weak and fatigued. States she has been off of her Norvasc for a while due to not having medical insurance. She has been taking her mother's hydrochlorothiazide daily. No PCP at this time. Denies cough, fever, chills, nausea, vomiting or diaphoresis.  Patient is a 48 y.o. female presenting with chest pain and shortness of breath. The history is provided by the patient.  Chest Pain Associated symptoms: fatigue and shortness of breath   [name] Associated symptoms: chest pain     Past Medical History  Diagnosis Date  . Diabetes mellitus   . Hypertension   . Stroke 02/2013   History reviewed. No pertinent past surgical history. No family history on file. History  Substance Use Topics  . Smoking status: Never Smoker   . Smokeless tobacco: Never Used  . Alcohol Use: No   OB History   Grav Para Term Preterm Abortions TAB SAB Ect Mult Living                 Review of Systems  Constitutional: Positive for fatigue.  Respiratory: Positive for shortness of breath.   Cardiovascular: Positive for chest pain and leg swelling.  Musculoskeletal: Positive for arthralgias and myalgias.  All other systems reviewed  and are negative.     Allergies  Valium; Lisinopril; and Penicillins  Home Medications   Prior to Admission medications   Medication Sig Start Date End Date Taking? Authorizing Provider  amLODipine (NORVASC) 5 MG tablet Take 5 mg by mouth daily. 09/21/11   Blanchie Dessert, MD  aspirin 81 MG tablet Take 81 mg by mouth daily.    Historical Provider, MD  hydrochlorothiazide (HYDRODIURIL) 25 MG tablet Take 25 mg by mouth daily.    Historical Provider, MD  hydrochlorothiazide (HYDRODIURIL) 25 MG tablet Take 1 tablet (25 mg total) by mouth daily. 03/05/13   Kristen N Ward, DO  insulin NPH-insulin regular (NOVOLIN 70/30) (70-30) 100 UNIT/ML injection Inject 25-75 Units into the skin 3 (three) times daily. 75 units in the morning, 25 units at midday, & 75 units in the evening 09/21/11   Blanchie Dessert, MD   BP 122/60  Pulse 102  Temp(Src) 98 F (36.7 C) (Oral)  Resp 22  SpO2 100% Physical Exam  Nursing note and vitals reviewed. Constitutional: She is oriented to person, place, and time. She appears well-developed and well-nourished. No distress.  Obese Anxious, tearful.  HENT:  Head: Normocephalic and atraumatic.  Mouth/Throat: Oropharynx is clear and moist.  Eyes: Conjunctivae and EOM are normal. Pupils are equal, round, and reactive to light.  Neck: Normal range of motion. Neck supple. No JVD present.  Cardiovascular: Regular rhythm, normal heart sounds and intact distal pulses.  Tachycardia present.   Pulses:      Dorsalis  pedis pulses are 2+ on the right side, and 2+ on the left side.       Posterior tibial pulses are 2+ on the right side, and 2+ on the left side.  +2 pitting edema bilateral ankles.  Pulmonary/Chest: Effort normal and breath sounds normal. No respiratory distress.  Abdominal: Soft. Bowel sounds are normal. There is no tenderness.  Musculoskeletal: Normal range of motion. She exhibits no edema.  Neurological: She is alert and oriented to person, place, and time.  She has normal strength. No sensory deficit.  Speech fluent, goal oriented. Moves limbs without ataxia. Equal grip strength bilateral.  Skin: Skin is warm and dry. She is not diaphoretic.  Psychiatric: Her behavior is normal. Her mood appears anxious.    ED Course  Procedures (including critical care time) Labs Review Labs Reviewed  CBC - Abnormal; Notable for the following:    WBC 11.4 (*)    Hemoglobin 11.1 (*)    HCT 34.2 (*)    All other components within normal limits  BASIC METABOLIC PANEL - Abnormal; Notable for the following:    Sodium 135 (*)    Potassium 3.5 (*)    Chloride 94 (*)    Glucose, Bld 357 (*)    GFR calc non Af Amer 71 (*)    GFR calc Af Amer 82 (*)    All other components within normal limits  CBG MONITORING, ED - Abnormal; Notable for the following:    Glucose-Capillary 337 (*)    All other components within normal limits  PRO B NATRIURETIC PEPTIDE  D-DIMER, QUANTITATIVE  I-STAT TROPOININ, ED  CBG MONITORING, ED    Imaging Review Dg Chest 2 View  07/19/2013   CLINICAL DATA:  Chest pain  EXAM: CHEST  2 VIEW  COMPARISON:  03/05/2013  FINDINGS: Cardiac shadow is stable. The lungs are clear bilaterally. No focal infiltrate or sizable effusion is noted. No acute bony abnormality is seen.  IMPRESSION: No active cardiopulmonary disease.   Electronically Signed   By: Inez Catalina M.D.   On: 07/19/2013 20:12     EKG Interpretation None      MDM   Final diagnoses:  Peripheral edema  Chest pain  Hyperglycemia   Patient presenting with shortness of breath, edema, chest pain. She is anxious but in no apparent distress. Slightly tachycardic. She has been off of Norvasc due to lack of insurance. Labs pending. It is noted she had a negative venous duplex in October 2014. Cannot PERC negative due to tachycardia with CP and SOB. Possible fluid overload, however lungs clear. 9:44 PM BNP and d-dimer within normal limits. Troponin negative. Mild leukocytosis of  11.4. Chest x-ray clear. EKG showing left axis deviation, no other acute findings. Patient reports her chest pain has subsided after receiving Ativan and Tylenol. She reports she still has body aches and pains, however improvement from when she first arrived. I discussed importance of medication compliance, prescription for insulin, hydrochlorothiazide, Norvasc and pain medicine given. Resources for PCP followup given. Return precautions given. Patient states understanding of treatment care plan and is agreeable.   Illene Labrador, PA-C 07/19/13 2147

## 2013-07-19 NOTE — ED Notes (Signed)
Pt presents with c/o chest pain, shortness of breath, and extremity swelling. Pt says that her whole body hurts and it started with her legs. Pt does have significant swelling to her lower extremities. Pt says that her chest is hurting in the center and she also has associated shortness of breath. Pt says that she has no energy. Pt is in NAD at this time and is talking in complete sentences.

## 2013-07-19 NOTE — Discharge Instructions (Signed)
It is important to take your medications daily as prescribed. Followup with the vomitus clinic to establish care with a primary care physician or one of the resources below. Take Vicodin for severe pain only. No driving or operating heavy machinery while taking vicodin. This medication may cause drowsiness.  Chest Pain (Nonspecific) It is often hard to give a specific diagnosis for the cause of chest pain. There is always a chance that your pain could be related to something serious, such as a heart attack or a blood clot in the lungs. You need to follow up with your caregiver for further evaluation. CAUSES   Heartburn.  Pneumonia or bronchitis.  Anxiety or stress.  Inflammation around your heart (pericarditis) or lung (pleuritis or pleurisy).  A blood clot in the lung.  A collapsed lung (pneumothorax). It can develop suddenly on its own (spontaneous pneumothorax) or from injury (trauma) to the chest.  Shingles infection (herpes zoster virus). The chest wall is composed of bones, muscles, and cartilage. Any of these can be the source of the pain.  The bones can be bruised by injury.  The muscles or cartilage can be strained by coughing or overwork.  The cartilage can be affected by inflammation and become sore (costochondritis). DIAGNOSIS  Lab tests or other studies, such as X-rays, electrocardiography, stress testing, or cardiac imaging, may be needed to find the cause of your pain.  TREATMENT   Treatment depends on what may be causing your chest pain. Treatment may include:  Acid blockers for heartburn.  Anti-inflammatory medicine.  Pain medicine for inflammatory conditions.  Antibiotics if an infection is present.  You may be advised to change lifestyle habits. This includes stopping smoking and avoiding alcohol, caffeine, and chocolate.  You may be advised to keep your head raised (elevated) when sleeping. This reduces the chance of acid going backward from your stomach  into your esophagus.  Most of the time, nonspecific chest pain will improve within 2 to 3 days with rest and mild pain medicine. HOME CARE INSTRUCTIONS   If antibiotics were prescribed, take your antibiotics as directed. Finish them even if you start to feel better.  For the next few days, avoid physical activities that bring on chest pain. Continue physical activities as directed.  Do not smoke.  Avoid drinking alcohol.  Only take over-the-counter or prescription medicine for pain, discomfort, or fever as directed by your caregiver.  Follow your caregiver's suggestions for further testing if your chest pain does not go away.  Keep any follow-up appointments you made. If you do not go to an appointment, you could develop lasting (chronic) problems with pain. If there is any problem keeping an appointment, you must call to reschedule. SEEK MEDICAL CARE IF:   You think you are having problems from the medicine you are taking. Read your medicine instructions carefully.  Your chest pain does not go away, even after treatment.  You develop a rash with blisters on your chest. SEEK IMMEDIATE MEDICAL CARE IF:   You have increased chest pain or pain that spreads to your arm, neck, jaw, back, or abdomen.  You develop shortness of breath, an increasing cough, or you are coughing up blood.  You have severe back or abdominal pain, feel nauseous, or vomit.  You develop severe weakness, fainting, or chills.  You have a fever. THIS IS AN EMERGENCY. Do not wait to see if the pain will go away. Get medical help at once. Call your local emergency services (911 in  U[place] Do not drive yourself to the hospital. MAKE SURE YOU:   Understand these instructions.  Will watch your condition.  Will get help right away if you are not doing well or get worse. Document Released: 12/02/2004 Document Revised: 05/17/2011 Document Reviewed: 09/28/2007 [facility] Patient Information 2014 [place].  Peripheral Edema You have swelling in your legs (peripheral edema). This swelling is due to excess accumulation of salt and water in your body. Edema may be a sign of heart, kidney or liver disease, or a side effect of a medication. It may also be due to problems in the leg veins. Elevating your legs and using special support stockings may be very helpful, if the cause of the swelling is due to poor venous circulation. Avoid long periods of standing, whatever the cause. Treatment of edema depends on identifying the cause. Chips, pretzels, pickles and other salty foods should be avoided. Restricting salt in your diet is almost always needed. Water pills (diuretics) are often used to remove the excess salt and water from your body via urine. These medicines prevent the kidney from reabsorbing sodium. This increases urine flow. Diuretic treatment may also result in lowering of potassium levels in your body. Potassium supplements may be needed if you have to use diuretics daily. Daily weights can help you keep track of your progress in clearing your edema. You should call your caregiver for follow up care as recommended. SEEK IMMEDIATE MEDICAL CARE IF:   You have increased swelling, pain, redness, or heat in your legs.  You develop shortness of breath, especially when lying down.  You develop chest or abdominal pain, weakness, or fainting.  You have a fever. Document Released: 04/01/2004 Document Revised: 05/17/2011 Document Reviewed: 03/12/2009 [facility] Patient Information 2014 [place].  Hyperglycemia Hyperglycemia occurs when the glucose (sugar) in your blood is too high. Hyperglycemia can happen for many reasons, but it most often happens to people who do not know they have diabetes or are not managing their diabetes properly.  CAUSES  Whether you have diabetes or not, there are other causes of hyperglycemia. Hyperglycemia can occur when you have diabetes, but it can also occur in  other situations that you might not be as aware of, such as: Diabetes  If you have diabetes and are having problems controlling your blood glucose, hyperglycemia could occur because of some of the following reasons:  Not following your meal plan.  Not taking your diabetes medications or not taking it properly.  Exercising less or doing less activity than you normally do.  Being sick. Pre-diabetes  This cannot be ignored. Before people develop Type 2 diabetes, they almost always have "pre-diabetes." This is when your blood glucose levels are higher than normal, but not yet high enough to be diagnosed as diabetes. Research has shown that some long-term damage to the body, especially the heart and circulatory system, may already be occurring during pre-diabetes. If you take action to manage your blood glucose when you have pre-diabetes, you may delay or prevent Type 2 diabetes from developing. Stress  If you have diabetes, you may be "diet" controlled or on oral medications or insulin to control your diabetes. However, you may find that your blood glucose is higher than usual in the hospital whether you have diabetes or not. This is often referred to as "stress hyperglycemia." Stress can elevate your blood glucose. This happens because of hormones put out by the body during times of stress. If stress has been the cause of  your high blood glucose, it can be followed regularly by your caregiver. That way he/she can make sure your hyperglycemia does not continue to get worse or progress to diabetes. Steroids  Steroids are medications that act on the infection fighting system (immune system) to block inflammation or infection. One side effect can be a rise in blood glucose. Most people can produce enough extra insulin to allow for this rise, but for those who cannot, steroids make blood glucose levels go even higher. It is not unusual for steroid treatments to "uncover" diabetes that is developing. It  is not always possible to determine if the hyperglycemia will go away after the steroids are stopped. A special blood test called an A1c is sometimes done to determine if your blood glucose was elevated before the steroids were started. SYMPTOMS  Thirsty.  Frequent urination.  Dry mouth.  Blurred vision.  Tired or fatigue.  Weakness.  Sleepy.  Tingling in feet or leg. DIAGNOSIS  Diagnosis is made by monitoring blood glucose in one or all of the following ways:  A1c test. This is a chemical found in your blood.  Fingerstick blood glucose monitoring.  Laboratory results. TREATMENT  First, knowing the cause of the hyperglycemia is important before the hyperglycemia can be treated. Treatment may include, but is not be limited to:  Education.  Change or adjustment in medications.  Change or adjustment in meal plan.  Treatment for an illness, infection, etc.  More frequent blood glucose monitoring.  Change in exercise plan.  Decreasing or stopping steroids.  Lifestyle changes. HOME CARE INSTRUCTIONS   Test your blood glucose as directed.  Exercise regularly. Your caregiver will give you instructions about exercise. Pre-diabetes or diabetes which comes on with stress is helped by exercising.  Eat wholesome, balanced meals. Eat often and at regular, fixed times. Your caregiver or nutritionist will give you a meal plan to guide your sugar intake.  Being at an ideal weight is important. If needed, losing as little as 10 to 15 pounds may help improve blood glucose levels. SEEK MEDICAL CARE IF:   You have questions about medicine, activity, or diet.  You continue to have symptoms (problems such as increased thirst, urination, or weight gain). SEEK IMMEDIATE MEDICAL CARE IF:   You are vomiting or have diarrhea.  Your breath smells fruity.  You are breathing faster or slower.  You are very sleepy or incoherent.  You have numbness, tingling, or pain in your feet  or hands.  You have chest pain.  Your symptoms get worse even though you have been following your caregiver's orders.  If you have any other questions or concerns. Document Released: 08/18/2000 Document Revised: 05/17/2011 Document Reviewed: 06/21/2011 [facility] Patient Information 2014 [place], [place]. RESOURCE GUIDE  Chronic Pain Problems: Contact [facility]  [phone] Patients need to be referred by their primary care doctor.  Insufficient Money for Medicine: Contact [employer]:  call "211."   No Primary Care Doctor: - Call [facility]  [phone] - can help you locate a primary care doctor that  accepts your insurance, provides certain services, etc. - Physician Referral Service- [phone]  Agencies that provide inexpensive medical care: - [facility]  [id number] - [facility]  [phone] - [facility]  [facility]  [phone] - [facility]  [phone] - [facility]  [phone]  [place] Providers: - [facility]- [address]  [phone], Mon-Fri 9am-7pm, Sat 9am-1pm - [place], [place], [address]  [phone] [facility]- [address]  [place], [address], [phone]  Only accepts [place] [facility] [place] patients after they have their name  applied to their card  Self Pay (no insurance) in [facility] - [place]: - Sickle Cell Patients: Dr Kevan Ny, [facility]  [place], [facility]- [place]  [facility]- [id number] [place], [facility]- see information above (Speak to [facility] if you do not have insurance)       -  [facility]- [place],  [place], [place]  Dr Vista Lawman-  [address], [place], [place]       -  [facility] and [facility] - [address] [phone]       -  [facility]- [address], [place], also [address], [id number]       -    [facility]- [place], [place], 1st & 3rd Saturday        every month, 10am-1pm  1) Find a Doctor and Pay Out of Pocket Although you won't have to find out who is covered by your insurance plan, it is a good idea to ask around and get recommendations. You will then need to call the office and see if the doctor you have chosen will accept you as a new patient and what types of options they offer for patients who are self-pay. Some doctors offer discounts or will set up payment plans for their patients who do not have insurance, but you will need to ask so you aren't surprised when you get to your appointment.  2) Contact Your Local Health Department Not all health departments have doctors that can see patients for sick visits, but many do, so it is worth a call to see if yours does. If you don't know where your local health department is, you can check in your phone book. The CDC also has a tool to help you locate your state's health department, and many state websites also have listings of all of their local health departments.  3) Find a [facility] If your illness is not likely to be very severe or complicated, you may want to try a walk in clinic. These are popping up all over the country in pharmacies, drugstores, and shopping centers. They're usually staffed by nurse practitioners or physician assistants that have been trained to treat common illnesses and complaints. They're usually fairly quick and inexpensive. However, if you have serious medical issues or chronic medical problems, these are probably not your  best option

## 2013-07-23 NOTE — ED Provider Notes (Signed)
Medical screening examination/treatment/procedure(s) were conducted as a shared visit with non-physician practitioner(s) and myself.  I personally evaluated the patient during the encounter.   EKG Interpretation None       Leota Jacobsen, MD 07/23/13 [phone]

## 2013-09-27 ENCOUNTER — Encounter: Payer: Self-pay | Admitting: Endocrinology

## 2013-09-27 ENCOUNTER — Ambulatory Visit (INDEPENDENT_AMBULATORY_CARE_PROVIDER_SITE_OTHER): Payer: 59 | Admitting: Endocrinology

## 2013-09-27 ENCOUNTER — Encounter: Payer: Self-pay | Admitting: *Deleted

## 2013-09-27 VITALS — BP 132/98 | HR 102 | Ht 66.25 in | Wt 326.5 lb

## 2013-09-27 DIAGNOSIS — E1149 Type 2 diabetes mellitus with other diabetic neurological complication: Secondary | ICD-10-CM

## 2013-09-27 DIAGNOSIS — I10 Essential (primary) hypertension: Secondary | ICD-10-CM

## 2013-09-27 DIAGNOSIS — E1142 Type 2 diabetes mellitus with diabetic polyneuropathy: Secondary | ICD-10-CM

## 2013-09-27 LAB — HM DIABETES FOOT EXAM: HM DIABETIC FOOT EXAM: ABNORMAL

## 2013-09-27 MED ORDER — INSULIN PEN NEEDLE 32G X 4 MM MISC: Status: DC

## 2013-09-27 MED ORDER — GLUCOSE BLOOD VI STRP: ORAL_STRIP | Status: DC

## 2013-09-27 MED ORDER — AMLODIPINE BESYLATE 5 MG PO TABS: 5.0000 mg | ORAL_TABLET | Freq: Every day | ORAL | Status: DC

## 2013-09-27 MED ORDER — INSULIN ASPART PROT & ASPART (70-30 MIX) 100 UNIT/ML PEN: PEN_INJECTOR | SUBCUTANEOUS | Status: DC

## 2013-09-27 MED ORDER — GABAPENTIN 100 MG PO CAPS: 100.0000 mg | ORAL_CAPSULE | Freq: Every day | ORAL | Status: DC

## 2013-09-27 MED ORDER — HYDROCHLOROTHIAZIDE 25 MG PO TABS: 25.0000 mg | ORAL_TABLET | Freq: Every day | ORAL | Status: DC

## 2013-09-27 MED ORDER — ONETOUCH ULTRASOFT LANCETS MISC: Status: DC

## 2013-09-27 NOTE — Progress Notes (Signed)
Pre visit review using our clinic review tool, if applicable. No additional management support is needed unless otherwise documented below in the visit note. 

## 2013-09-27 NOTE — Patient Instructions (Signed)
  Check sugars three to four times daily.  Record in a sugar log and fax readings to me in 1 week.  Continue current doses of 70/30 insulin. Rx sent.  Start neurontin for possible neuropathy.  Restart BP medications.   Please come back for a follow-up appointment in 2 weeks

## 2013-09-27 NOTE — Progress Notes (Signed)
Reason for visit-  Deborah [name] is a 48 y.o.-year-old female, self referred for management of Type 2 diabetes.     HPI: She is here with her husband. The patient used to be seen by Dr. Chalmers Cater for her endocrine care in the past. Last seen by her about 2-3 years. Following that she lost her insurance and job and couldn't keep regular medical follow up. She now has a new job/insurance for the past 3 months and is willing to start taking better care of her sugars. Patient has been diagnosed with diabetes in 1998 when she was symptomatic with hyperglycemia symptoms. Strong family history of diabetes in father, mother, brothers, sister.  Started on metformin then and was doing well for about 4 years from Dx when she was taken off Metformin due to diarrhea. Recalls trying Actos, but developed heart racing on it.  Reports being on SU in the past and Avandia. Also been on Byetta in the past. Then on started on insulin- Novolog 70/30. For the past couple of years, she has been using OTC Novolin 70/30 and trying to stretch out her insulin use by missing the lunch time dose on most occasions. She has been missing her morning dose of insulin about 3-4 times monthly as well. She has been without insulin since yesterday evening, when she took 50 units. Is supposed to be taking Novolin 70/30 at 75 units with BF and supper and 50 units with lunch. Reports that last A1c done with Dr Chalmers Cater was around 10-11.   She doesn't have glucometer, and hasn't checked her sugars in about 1 month. States that then her sugars were mostly in 150-250. Is symptomatic with nocturia now. Denies hypoglycemia symptoms recently. Denies any weight loss recently. Is struggling with menopausal symptoms. Also reports that she has been having night time diarrhea for the past year and feels fatigued due to this. She is also having pain in her lower legs associated with burning that spreads to her entire body. She does report some numbness in  her legs occasionally.   Dietary habits- tries eats three times daily.  Not limiting carbs . Drinks Water and tea and lemonade. Sodas once in a  while. Exercise- tries to walk but has LBP and leg pain.  Weight -  Wt Readings from Last 3 Encounters:  09/27/13 326 lb 8 oz (148.099 kg)  12/10/12 310 lb (140.615 kg)  02/04/11 310 lb (140.615 kg)    Diabetes Complications- Stroke 0000000 left side affected now mostly recovered, occasionally numbness in extremities Nephropathy-- No  CKD, last BUN/creatinine- Lab Results  Component Value Date   BUN 14 07/19/2013   CREATININE 0.93 07/19/2013    No results found for this basename: HGBA1C   Retinopathy- No,  Last DEE was in 4 years ago in [place] Neuropathy- numbness and tingling in her feet/legs with burning as described above.  Associated history - No known history of CAD or hyperlipidemia. No hypothyroidism or prior thyroid history. her last TSH was  No results found for this basename: TSH    Past Medical History  Diagnosis Date  . Diabetes mellitus   . Hypertension   . Stroke 02/2013   Past Surgical History  Procedure Laterality Date  . No past surgeries     History   Social History  . Marital Status: Married    Spouse Name: N/A    Number of Children: N/A  . Years of Education: N/A   Occupational History  . Not on file.  Social History Main Topics  . Smoking status: Never Smoker   . Smokeless tobacco: Never Used  . Alcohol Use: No  . Drug Use: No  . Sexual Activity: Not on file   Other Topics Concern  . Not on file   Social History Narrative  . No narrative on file   Current Outpatient Prescriptions on File Prior to Visit  Medication Sig Dispense Refill  . aspirin 81 MG tablet Take 81 mg by mouth daily.      [name] HYDROcodone-acetaminophen (NORCO/VICODIN) 5-325 MG per tablet Take 1-2 tablets by mouth every 4 (four) hours as needed.  10 tablet  0  . ibuprofen (ADVIL,MOTRIN) 800 MG tablet Take 800 mg by mouth every 8  (eight) hours as needed for moderate pain.       No current facility-administered medications on file prior to visit.   Allergies  Allergen Reactions  . Valium [Diazepam] [name]  . Lisinopril Swelling    Tongue and mouth  . Penicillins Palpitations   Family History  Problem Relation Age of Onset  . Diabetes Mother   . Diabetes Father   . Heart disease Father   . Diabetes Sister   . Diabetes Brother     I have reviewed the patient's past medical history, family and social history, surgical history, medications and allergies.   Blood Pressure/HTN- Patient's blood pressure is not well controlled today. She has been out of Norvasc and HCTZ for the past week. Is allergic to lisinopril due to angioedema. Hyperlipidemia- her last set of lipids were-  No results found for this basename: CHOL, HDL, LDLCALC, LDLDIRECT, TRIG, CHOLHDL    ROS: Review of Systems: General:  Reports Recent weight change,Fatigue, Loss of appetite Eyes: Reports  Vision Difficulty, No Eye pain ENT: Reports Hearing difficulty, No Difficulty Swallowing CVS: Reports occasional Chest pain, Palpitations/Irregular Heart beat and leg swelling, No [name] lying flat Resp: Denies Frequent Cough, Wheezing. Reports [name] GI: Reports Heartburn, Diarrhea, Denies Nausea or Vomiting, Constipation, Abdominal Pain GU: Denies polyuria, Reports nocturia Bones/joints: Reports Muscle aches, Joint Pain,  No Bone pain Skin/Hair/Nails: Denies Rash, New stretch marks, Excessive hair growth, Reports Itching, Hair loss Reproduction: Reports  Low sexual desire, Denies Women: Menstrual cycle problems, Women: Breast Discharge,  CNS: Denies Tremors, Seizures, Loss of consciousness, Localized weakness. Reports Frequent Headaches and blurry vision Endocrine: Denies Excess thirst, Feeling excessively hot, Feeling excessively cold, Hot flushes Heme: Denies Easy bruising, Enlarged glands or lumps in  neck Allergy: Denies Food allergies, Environmental allergies  PE: BP 132/98  Pulse 102  Ht 5' 6.25" (1.683 m)  Wt 326 lb 8 oz (148.099 kg)  BMI 52.29 kg/m2  SpO2 97% Wt Readings from Last 3 Encounters:  09/27/13 326 lb 8 oz (148.099 kg)  12/10/12 310 lb (140.615 kg)  02/04/11 310 lb (140.615 kg)   GENERAL: No acute distress, well developed, obese HEENT:  Eye exam shows normal external appearance. Oral exam shows normal mucosa .  NECK:   Neck exam shows no lymphadenopathy. No Carotids bruits. Thyroid is midly enlarged and no nodules felt.  + acanthosis nigricans LUNGS:         Chest is symmetrical. Lungs are clear to auscultation.[name]   HEART:         Heart sounds:  S1 and S2 are normal. No murmurs or clicks heard. ABDOMEN:  No Distention present. Liver and spleen are not palpable. No other mass or tenderness present.  EXTREMITIES:  There is no edema. 1+ DP   NEUROLOGICAL:     Grossly intact.            Diabetic foot exam done with shoes and socks removed: Patchy loss of Normal Monofilament testing bilaterally over toes bilaterally.   Nails  Not dystrophic.  MUSCULOSKELETAL:       There is no enlargement or gross deformity of the joints.  SKIN:       No rash  ASSESSMENT: 1. Type 2 Diabetes, uncontrolled, with neuropathy 2. Hypertension 3. Neuropathy due to DM  PLAN:  1. Patient with long-standing diabetes, appears to uncontrolled recently on current regimen of pre mixed insulin. Therapy so far has been limited by compliance secondary to finances. We discussed about long term complications of uncontrolled Diabetes, goal sugars and goal A1c.  Unfortunately, there is no recent A1c or sugar readings. I have encouraged the patient to start checking her sugars 4x daily ( qac and qhs). Onetouch Verio given. She will submit her sugar log in 1 week for further adjustments. She will continue her current dose of insulin 70/30 for now ( 75 units BF, 50 units lunch and 75 units supper) and based  on her sugar log will make further adjustments.  We discussed about other treatment options that she could consider - basal/bolus regimen, U 500 insulin, GLP-1 , SGLT2 inhibitors. These will be revisited at later visits based on her insulin requirements and her sugars.  Update labs at this time. Discussed dietary modifications and the patient wishes to be referred for an appointment for DM education. Will do so. She was reminded to visit her eye doctor for a DEE.   2. Neuropathy- Discussed foot care. Discussed starting treatment with gabapentin and the patient is agreeable. Will initiate at 100 mg qhs. 3. HTN- not controlled. She really needs to get set up with a PCP asap for many of her symptoms and general medical care. The patient is agreeable. Will refill her Norvasc and HCTZ for 30 days in the interim.    - Return to clinic in 2 weeks with sugar log/meter.  Mayleigh Tetrault [facility] 09/28/2013 1:39 PM

## 2013-09-28 ENCOUNTER — Other Ambulatory Visit: Payer: Self-pay | Admitting: *Deleted

## 2013-09-28 ENCOUNTER — Other Ambulatory Visit: Payer: Self-pay | Admitting: Endocrinology

## 2013-09-28 ENCOUNTER — Telehealth: Payer: Self-pay | Admitting: *Deleted

## 2013-09-28 ENCOUNTER — Encounter: Payer: Self-pay | Admitting: Endocrinology

## 2013-09-28 DIAGNOSIS — I10 Essential (primary) hypertension: Secondary | ICD-10-CM | POA: Insufficient documentation

## 2013-09-28 LAB — COMPREHENSIVE METABOLIC PANEL WITH GFR
ALT: 21 U/L (ref 0–35)
AST: 18 U/L (ref 0–37)
Albumin: 3.5 g/dL (ref 3.5–5.2)
Alkaline Phosphatase: 100 U/L (ref 39–117)
BUN: 13 mg/dL (ref 6–23)
CO2: 24 meq/L (ref 19–32)
Calcium: 9.5 mg/dL (ref 8.4–10.5)
Chloride: 100 meq/L (ref 96–112)
Creatinine, Ser: 1.2 mg/dL (ref 0.4–1.2)
GFR: 60.69 mL/min (ref >60.00)
Glucose, Bld: 309 mg/dL — ABNORMAL HIGH (ref 70–99)
Potassium: 3.4 meq/L — ABNORMAL LOW (ref 3.5–5.1)
Sodium: 135 meq/L (ref 135–145)
Total Bilirubin: 0.5 mg/dL (ref 0.2–1.2)
Total Protein: 7.9 g/dL (ref 6.0–8.3)

## 2013-09-28 LAB — TSH: TSH: 0.17 u[IU]/mL — ABNORMAL LOW (ref 0.35–4.50)

## 2013-09-28 LAB — HEMOGLOBIN A1C: Hgb A1c MFr Bld: 9.9 % — ABNORMAL HIGH (ref 4.6–6.5)

## 2013-09-28 MED ORDER — INSULIN LISPRO PROT & LISPRO (75-25 MIX) 100 UNIT/ML ~~LOC~~ SUSP: SUBCUTANEOUS | Status: DC

## 2013-09-28 MED ORDER — INSULIN ASPART PROT & ASPART (70-30 MIX) 100 UNIT/ML ~~LOC~~ SUSP: SUBCUTANEOUS | Status: DC

## 2013-09-28 NOTE — Telephone Encounter (Signed)
Fax received from pharmacy. "Insurance prefers Humalog 75/25 and will need PA" [place] for 75/25?

## 2013-09-28 NOTE — Telephone Encounter (Signed)
Rx sent to pharmacy by escript. Pt notified of change

## 2013-09-28 NOTE — Telephone Encounter (Signed)
Okay to switch Novolog 70/30 to Humalog 75/25 ( same doses). Then I don't think we will need a PA if we are going per her insurance preference.  thanks

## 2013-09-28 NOTE — Telephone Encounter (Signed)
Fax received from [facility]. "Pt requests vials since pen needles cost her $45 /month. Please send in new Rx/ Also send in for 30 day supply, Qty 60 ml"

## 2013-09-28 NOTE — Telephone Encounter (Signed)
error 

## 2013-10-01 NOTE — Addendum Note (Signed)
Addended by: Karlene Einstein D on: 10/01/2013 09:55 AM   Modules accepted: Orders

## 2013-10-02 ENCOUNTER — Emergency Department (HOSPITAL_COMMUNITY): Payer: 59

## 2013-10-02 ENCOUNTER — Encounter (HOSPITAL_COMMUNITY): Payer: Self-pay | Admitting: Emergency Medicine

## 2013-10-02 ENCOUNTER — Emergency Department (HOSPITAL_COMMUNITY)
Admission: EM | Admit: 2013-10-02 | Discharge: 2013-10-03 | Disposition: A | Discharge status: Home / Self Care | Payer: 59 | Attending: Emergency Medicine | Admitting: Emergency Medicine

## 2013-10-02 DIAGNOSIS — E119 Type 2 diabetes mellitus without complications: Secondary | ICD-10-CM | POA: Insufficient documentation

## 2013-10-02 DIAGNOSIS — Z880 Allergy status to penicillin: Secondary | ICD-10-CM | POA: Insufficient documentation

## 2013-10-02 DIAGNOSIS — R112 Nausea with vomiting, unspecified: Secondary | ICD-10-CM | POA: Diagnosis not present

## 2013-10-02 DIAGNOSIS — Z8673 Personal history of transient ischemic attack (TIA), and cerebral infarction without residual deficits: Secondary | ICD-10-CM | POA: Insufficient documentation

## 2013-10-02 DIAGNOSIS — R079 Chest pain, unspecified: Secondary | ICD-10-CM | POA: Diagnosis present

## 2013-10-02 DIAGNOSIS — N390 Urinary tract infection, site not specified: Secondary | ICD-10-CM | POA: Diagnosis not present

## 2013-10-02 DIAGNOSIS — I10 Essential (primary) hypertension: Secondary | ICD-10-CM | POA: Insufficient documentation

## 2013-10-02 DIAGNOSIS — R0789 Other chest pain: Secondary | ICD-10-CM | POA: Diagnosis not present

## 2013-10-02 DIAGNOSIS — F411 Generalized anxiety disorder: Secondary | ICD-10-CM | POA: Diagnosis not present

## 2013-10-02 DIAGNOSIS — G8190 Hemiplegia, unspecified affecting unspecified side: Secondary | ICD-10-CM | POA: Insufficient documentation

## 2013-10-02 DIAGNOSIS — Z79899 Other long term (current) drug therapy: Secondary | ICD-10-CM | POA: Diagnosis not present

## 2013-10-02 LAB — URINALYSIS, ROUTINE W REFLEX MICROSCOPIC
BILIRUBIN URINE: NEGATIVE
KETONES UR: 15 mg/dL — AB
NITRITE: NEGATIVE
PH: 6 (ref 5.0–8.0)
Protein, ur: 100 mg/dL — AB
SPECIFIC GRAVITY, URINE: 1.023 (ref 1.005–1.030)
Urobilinogen, UA: 0.2 mg/dL (ref 0.0–1.0)

## 2013-10-02 LAB — I-STAT TROPONIN, ED
TROPONIN I, POC: 0 ng/mL (ref 0.00–0.08)
Troponin i, poc: 0.01 ng/mL (ref 0.00–0.08)

## 2013-10-02 LAB — BASIC METABOLIC PANEL
ANION GAP: 17 — AB (ref 5–15)
BUN: 10 mg/dL (ref 6–23)
CHLORIDE: 98 meq/L (ref 96–112)
CO2: 23 meq/L (ref 19–32)
Calcium: 8.8 mg/dL (ref 8.4–10.5)
Creatinine, Ser: 0.77 mg/dL (ref 0.50–1.10)
GFR calc Af Amer: >90 mL/min (ref >90)
GFR calc non Af Amer: >90 mL/min (ref >90)
GLUCOSE: 307 mg/dL — AB (ref 70–99)
Potassium: 3.3 mEq/L — ABNORMAL LOW (ref 3.7–5.3)
SODIUM: 138 meq/L (ref 137–147)

## 2013-10-02 LAB — URINE MICROSCOPIC-ADD ON

## 2013-10-02 LAB — PRO B NATRIURETIC PEPTIDE: PRO B NATRI PEPTIDE: 100.6 pg/mL (ref 0–125)

## 2013-10-02 LAB — CBC
HCT: 31.7 % — ABNORMAL LOW (ref 36.0–46.0)
HEMOGLOBIN: 10.4 g/dL — AB (ref 12.0–15.0)
MCH: 28.6 pg (ref 26.0–34.0)
MCHC: 32.8 g/dL (ref 30.0–36.0)
MCV: 87.1 fL (ref 78.0–100.0)
PLATELETS: 352 10*3/uL (ref 150–400)
RBC: 3.64 MIL/uL — AB (ref 3.87–5.11)
RDW: 13.2 % (ref 11.5–15.5)
WBC: 12.7 10*3/uL — AB (ref 4.0–10.5)

## 2013-10-02 LAB — T4: T4, Total: 8.7 ug/dL (ref 5.0–12.5)

## 2013-10-02 LAB — T4, FREE: Free T4: 1.56 ng/dL (ref 0.80–1.80)

## 2013-10-02 LAB — D-DIMER, QUANTITATIVE (NOT AT ARMC): D DIMER QUANT: 0.32 ug{FEU}/mL (ref 0.00–0.48)

## 2013-10-02 MED ORDER — PHENAZOPYRIDINE HCL 100 MG PO TABS
200.0000 mg | ORAL_TABLET | Freq: Once | ORAL | Status: AC
  Administered 2013-10-02: 200 mg via ORAL
  Filled 2013-10-02: qty 2

## 2013-10-02 MED ORDER — DEXTROSE 5 % IV SOLN
1.0000 g | Freq: Once | INTRAVENOUS | Status: AC
  Administered 2013-10-02: 1 g via INTRAVENOUS
  Filled 2013-10-02: qty 10

## 2013-10-02 MED ORDER — SODIUM CHLORIDE 0.9 % IV BOLUS (SEPSIS)
1000.0000 mL | Freq: Once | INTRAVENOUS | Status: AC
  Administered 2013-10-02: 1000 mL via INTRAVENOUS

## 2013-10-02 MED ORDER — KETOROLAC TROMETHAMINE 30 MG/ML IJ SOLN
30.0000 mg | Freq: Once | INTRAMUSCULAR | Status: AC
  Administered 2013-10-02: 30 mg via INTRAVENOUS
  Filled 2013-10-02: qty 1

## 2013-10-02 MED ORDER — DIPHENHYDRAMINE HCL 50 MG/ML IJ SOLN
25.0000 mg | Freq: Once | INTRAMUSCULAR | Status: AC
  Administered 2013-10-02: 25 mg via INTRAVENOUS
  Filled 2013-10-02: qty 1

## 2013-10-02 MED ORDER — NITROGLYCERIN 2 % TD OINT
1.0000 [in_us] | TOPICAL_OINTMENT | Freq: Once | TRANSDERMAL | Status: AC
  Administered 2013-10-02: 1 [in_us] via TOPICAL
  Filled 2013-10-02: qty 1

## 2013-10-02 MED ORDER — METOCLOPRAMIDE HCL 5 MG/ML IJ SOLN
10.0000 mg | Freq: Once | INTRAMUSCULAR | Status: AC
  Administered 2013-10-02: 10 mg via INTRAVENOUS
  Filled 2013-10-02: qty 2

## 2013-10-02 MED ORDER — LORAZEPAM 2 MG/ML IJ SOLN
1.0000 mg | Freq: Once | INTRAMUSCULAR | Status: AC
  Administered 2013-10-02: 1 mg via INTRAVENOUS
  Filled 2013-10-02: qty 1

## 2013-10-02 NOTE — ED Notes (Signed)
Report given off to [place], [place]

## 2013-10-02 NOTE — ED Notes (Signed)
Pt reports she started having cp this morning. While at work she had some lower abdominal discomfort with nausea and vomiting x3. Pt left work to go to ER and was unable to make it. With EMS pt was hyperventilating and htn. Ems administered 324 asa and 2 nitro. Pain decreased from 10 to 6/10. Pt noted to be shaking upon arrival and breathing heavily. Breath sounds clear and equal. Skin warm and dry.

## 2013-10-02 NOTE — ED Notes (Signed)
Pt reporting abdominal pain and chest pain. Dr. Tomi Bamberger made aware.

## 2013-10-02 NOTE — ED Provider Notes (Signed)
[name]: [id number]     Arrival date & time 10/02/13  1649 History   First MD Initiated Contact with Patient 10/02/13 1707     Chief Complaint  Patient presents with  . Chest Pain     (Consider location/radiation/quality/duration/timing/severity/associated sxs/prior Treatment) HPI Patient reports when she was driving to work today at 11:00 she had a central chest pain that radiated down into her lower abdomen that she describes as sharp. She had vomiting x3. She states she felt short of breath. She states the pain was only sharp about a minute and then it became throbbing and now is aching. She states the pain is worse when she bends over, she states sitting back makes it feel better. She had nausea and diaphoresis. She has been having some swelling in her leg. She states she has been having trouble sleeping. She states she's had similar symptoms when she had her stroke in 2012. At that time she had left-sided weakness. She states she did do rehabilitation. 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 Patient states her current pain as a 5/10. She states at its worst it was a 10 out of 10.  Family history patient states her sister died of enlarged heart, her father had congestive heart failure.  PCP none Cardiologist Dr Gwendel Hanson  Past Medical History  Diagnosis Date  . Diabetes mellitus   . Hypertension   . Stroke 02/2013   Past Surgical History  Procedure Laterality Date  . No past surgeries     Family History  Problem Relation Age of Onset  . Diabetes Mother   . Diabetes Father   . Heart disease Father   . Diabetes Sister   . Diabetes Brother    History  Substance Use Topics  . Smoking status: Never Smoker   . Smokeless tobacco: Never Used  . Alcohol Use: No   employed  OB History   Grav Para Term Preterm Abortions TAB SAB Ect Mult Living                 Review of Systems  All other systems reviewed and are  negative.     Allergies  Penicillins; Valium; and Lisinopril  Home Medications   Prior to Admission medications   Medication Sig Start Date End Date Taking? Authorizing Provider  amLODipine (NORVASC) 5 MG tablet Take 1 tablet (5 mg total) by mouth daily. 09/27/13  Yes Bynum Bellows, MD  hydrochlorothiazide (HYDRODIURIL) 25 MG tablet Take 1 tablet (25 mg total) by mouth daily. 09/27/13  Yes Bynum Bellows, MD  insulin lispro protamine-lispro (HUMALOG MIX 75/25) (75-25) 100 UNIT/ML SUSP injection Inject 75 units Roberts in the morning , 50 units Stryker with lunch, 75 units Hume with supper 09/28/13  Yes Bynum Bellows, MD  cephALEXin (KEFLEX) 500 MG capsule Take 1 capsule (500 mg total) by mouth 3 (three) times daily. 10/03/13   Janice Norrie, MD  cyclobenzaprine (FLEXERIL) 10 MG tablet Take 1 tablet (10 mg total) by mouth 3 (three) times daily as needed (muscle pain). 10/03/13   Janice Norrie, MD  naproxen (NAPROSYN) 500 MG tablet Take 1 tablet (500 mg total) by mouth 2 (two) times daily with a meal. 10/03/13   Janice Norrie, MD  phenazopyridine (PYRIDIUM) 200 MG tablet Take 1 tablet (200 mg total) by mouth 3 (three) times daily as needed (pain on urination). 10/03/13   Janice Norrie, MD  traMADol (ULTRAM) 50 MG tablet Take  2 tablets (100 mg total) by mouth every 6 (six) hours as needed. 10/03/13   Janice Norrie, MD   BP 140/77  Pulse 101  Temp(Src) 98.2 F (36.8 C)  Resp 20  SpO2 100%  Vital signs normal except tachycardia  Physical Exam  Nursing note and vitals reviewed. Constitutional: She is oriented to person, place, and time. She appears well-developed and well-nourished.  Non-toxic appearance. She does not appear ill. No distress.  HENT:  Head: Normocephalic and atraumatic.  Right Ear: External ear normal.  Left Ear: External ear normal.  Nose: Nose normal. No mucosal edema or rhinorrhea.  Mouth/Throat: Oropharynx is clear and moist and mucous membranes are normal. No dental abscesses or uvula  swelling.  Eyes: Conjunctivae and EOM are normal. Pupils are equal, round, and reactive to light.  Neck: Normal range of motion and full passive range of motion without pain. Neck supple.  Cardiovascular: Normal rate, regular rhythm and normal heart sounds.  Exam reveals no gallop and no friction rub.   No murmur heard. Pulmonary/Chest: Effort normal and breath sounds normal. No respiratory distress. She has no wheezes. She has no rhonchi. She has no rales. She exhibits no tenderness and no crepitus.  Abdominal: Soft. Normal appearance and bowel sounds are normal. She exhibits no distension. There is no tenderness. There is no rebound and no guarding.  Musculoskeletal: Normal range of motion. She exhibits no edema and no tenderness.  Moves all extremities well.   Neurological: She is alert and oriented to person, place, and time. She has normal strength. No cranial nerve deficit.  Skin: Skin is warm, dry and intact. No rash noted. No erythema. No pallor.  Psychiatric: Her mood appears anxious. Her speech is rapid and/or pressured. She is agitated.    ED Course  Procedures (including critical care time)  Medications  nitroGLYCERIN (NITROGLYN) 2 % ointment 1 inch (1 inch Topical Given 10/02/13 1847)  LORazepam (ATIVAN) injection 1 mg (1 mg Intravenous Given 10/02/13 1849)  sodium chloride 0.9 % bolus 1,000 mL (0 mLs Intravenous Stopped 10/02/13 2201)  ketorolac (TORADOL) 30 MG/ML injection 30 mg (30 mg Intravenous Given 10/02/13 2201)  metoCLOPramide (REGLAN) injection 10 mg (10 mg Intravenous Given 10/02/13 2214)  diphenhydrAMINE (BENADRYL) injection 25 mg (25 mg Intravenous Given 10/02/13 2215)  cefTRIAXone (ROCEPHIN) 1 g in dextrose 5 % 50 mL IVPB (0 g Intravenous Stopped 10/03/13 0010)  phenazopyridine (PYRIDIUM) tablet 200 mg (200 mg Oral Given 10/02/13 2330)     Patient was given medications for her chest pain which is very atypical for cardiac pain. Patient has been under a lot of stress.  She also indicates she has pain over her suprapubic area she does appear to have UTI on her urinalysis. She was given antibiotics for that.  Labs Review Results for orders placed during the hospital encounter of 10/02/13  CBC      Result Value Ref Range   WBC 12.7 (*) 4.0 - 10.5 K/uL   RBC 3.64 (*) 3.87 - 5.11 MIL/uL   Hemoglobin 10.4 (*) 12.0 - 15.0 g/dL   HCT 31.7 (*) 36.0 - 46.0 %   MCV 87.1  78.0 - 100.0 fL   MCH 28.6  26.0 - 34.0 pg   MCHC 32.8  30.0 - 36.0 g/dL   RDW 13.2  11.5 - 15.5 %   Platelets 352  150 - 400 K/uL  BASIC METABOLIC PANEL      Result Value Ref Range   Sodium 138  137 - 147 mEq/L   Potassium 3.3 (*) 3.7 - 5.3 mEq/L   Chloride 98  96 - 112 mEq/L   CO2 23  19 - 32 mEq/L   Glucose, Bld 307 (*) 70 - 99 mg/dL   BUN 10  6 - 23 mg/dL   Creatinine, Ser 0.77  0.50 - 1.10 mg/dL   Calcium 8.8  8.4 - 10.5 mg/dL   GFR calc non Af Amer >90  >90 mL/min   GFR calc Af Amer >90  >90 mL/min   Anion gap 17 (*) 5 - 15  D-DIMER, QUANTITATIVE      Result Value Ref Range   D-Dimer, Quant 0.32  0.00 - 0.48 ug/mL-FEU  PRO B NATRIURETIC PEPTIDE      Result Value Ref Range   Pro B Natriuretic peptide (BNP) 100.6  0 - 125 pg/mL  URINALYSIS, ROUTINE W REFLEX MICROSCOPIC      Result Value Ref Range   Color, Urine YELLOW  YELLOW   APPearance CLOUDY (*) CLEAR   Specific Gravity, Urine 1.023  1.005 - 1.030   pH 6.0  5.0 - 8.0   Glucose, UA >1000 (*) NEGATIVE mg/dL   Hgb urine dipstick LARGE (*) NEGATIVE   Bilirubin Urine NEGATIVE  NEGATIVE   Ketones, ur 15 (*) NEGATIVE mg/dL   Protein, ur 100 (*) NEGATIVE mg/dL   Urobilinogen, UA 0.2  0.0 - 1.0 mg/dL   Nitrite NEGATIVE  NEGATIVE   Leukocytes, UA MODERATE (*) NEGATIVE  URINE MICROSCOPIC-ADD ON      Result Value Ref Range   Squamous Epithelial / LPF RARE  RARE   WBC, UA TOO NUMEROUS TO COUNT  <3 WBC/hpf   RBC / HPF 21-50  <3 RBC/hpf   Bacteria, UA MANY (*) RARE  I-STAT TROPOININ, ED      Result Value Ref Range   Troponin  i, poc 0.00  0.00 - 0.08 ng/mL   Comment 3           I-STAT TROPOININ, ED      Result Value Ref Range   Troponin i, poc 0.01  0.00 - 0.08 ng/mL   Comment 3             Laboratory interpretation all normal except poss uti, hypokalemia   Imaging Review Ct Head Wo Contrast  10/02/2013   CLINICAL DATA:  weakness left sided, hx of CVA with left weakness.  EXAM: CT HEAD WITHOUT CONTRAST  TECHNIQUE: Contiguous axial images were obtained from the base of the skull through the vertex without intravenous contrast.  COMPARISON:  Prior CT from 08/12/2009  FINDINGS: There is no acute intracranial hemorrhage or infarct. No mass lesion or midline shift. Gray-white matter differentiation is well maintained. Ventricles are normal in size without evidence of hydrocephalus. CSF containing spaces are within normal limits. No extra-axial fluid collection.  The calvarium is intact.  Orbital soft tissues are within normal limits.  The paranasal sinuses and mastoid air cells are well pneumatized and free of fluid.  Scalp soft tissues are unremarkable.  IMPRESSION: Normal head CT with no acute intracranial abnormality identified.   Electronically Signed   By: Jeannine Boga M.D.   On: 10/02/2013 19:26   Dg Chest Port 1 View  10/02/2013   CLINICAL DATA:  Chest pain.  Hypertension.  Diabetes.  EXAM: PORTABLE CHEST - 1 VIEW  COMPARISON:  07/19/2013  FINDINGS: Thoracic spondylosis. Borderline enlarged cardiopericardial silhouette. No pulmonary edema. No pleural effusion. The lungs appear clear.  IMPRESSION: 1. Borderline enlargement of the cardiopericardial silhouette. 2. Thoracic spondylosis.   Electronically Signed   By: Sherryl Barters M.D.   On: 10/02/2013 18:53   Dg Abd 2 Views  10/02/2013   CLINICAL DATA:  Abdominal pain, shortness of breath, chest pain, vomiting.  EXAM: ABDOMEN - 2 VIEW  COMPARISON:  None.  FINDINGS: The bowel gas pattern is normal. There is no evidence of free air. No radio-opaque calculi or  other significant radiographic abnormality is seen.  IMPRESSION: Negative.   Electronically Signed   By: Lucienne Capers M.D.   On: 10/02/2013 21:56     EKG Interpretation   Date/Time:  Tuesday October 02 2013 17:09:13 EDT Ventricular Rate:  96 PR Interval:    QRS Duration: 89 QT Interval:  375 QTC Calculation: 474 R Axis:   -49 Text Interpretation:  Normal sinus rhythm Left anterior fascicular block  RSR' in V1 or V2, probably normal variant No significant change since last  tracing 19 Jul 2013 Confirmed by [facility]  MD-I, Kayli Beal ([id number]) on 10/02/2013  5:18:01 PM      MDM   Final diagnoses:  Atypical chest pain  UTI (lower urinary tract infection)     New Prescriptions   CEPHALEXIN (KEFLEX) 500 MG CAPSULE    Take 1 capsule (500 mg total) by mouth 3 (three) times daily.   CYCLOBENZAPRINE (FLEXERIL) 10 MG TABLET    Take 1 tablet (10 mg total) by mouth 3 (three) times daily as needed (muscle pain).   NAPROXEN (NAPROSYN) 500 MG TABLET    Take 1 tablet (500 mg total) by mouth 2 (two) times daily with a meal.   PHENAZOPYRIDINE (PYRIDIUM) 200 MG TABLET    Take 1 tablet (200 mg total) by mouth 3 (three) times daily as needed (pain on urination).   TRAMADOL (ULTRAM) 50 MG TABLET    Take 2 tablets (100 mg total) by mouth every 6 (six) hours as needed.    Plan discharge  Rolland Porter, MD, Alanson Aly, MD 10/03/13 [phone]

## 2013-10-02 NOTE — ED Notes (Signed)
Pt reporting she is nauseous and states she feels like she has to vomit. Pt given emesis basin and pt dry heaved with spit. Pt also tearful and shaking. Dr. Tomi Bamberger aware.

## 2013-10-02 NOTE — ED Notes (Signed)
Family came to this RN asking for pt to be transferred to [facility] due to emotional issues involving the death of her father at [facility]. MD aware

## 2013-10-03 MED ORDER — CYCLOBENZAPRINE HCL 10 MG PO TABS: 10.0000 mg | ORAL_TABLET | Freq: Three times a day (TID) | ORAL | Status: DC | PRN

## 2013-10-03 MED ORDER — NAPROXEN 500 MG PO TABS: 500.0000 mg | ORAL_TABLET | Freq: Two times a day (BID) | ORAL | Status: DC

## 2013-10-03 MED ORDER — PHENAZOPYRIDINE HCL 200 MG PO TABS: 200.0000 mg | ORAL_TABLET | Freq: Three times a day (TID) | ORAL | Status: DC | PRN

## 2013-10-03 MED ORDER — CEPHALEXIN 500 MG PO CAPS: 500.0000 mg | ORAL_CAPSULE | Freq: Three times a day (TID) | ORAL | Status: DC

## 2013-10-03 MED ORDER — TRAMADOL HCL 50 MG PO TABS: 100.0000 mg | ORAL_TABLET | Freq: Four times a day (QID) | ORAL | Status: DC | PRN

## 2013-10-03 NOTE — Discharge Instructions (Signed)
Take the medications as prescribed. The pyridium will turn your urine bright orange and will stain your underwear if you leak, wear a pad or panty liner. Call the Gordon hone number listed below to get a primary care doctor. Recheck if you get a fever, cough, short of breath, uncontrolled vomiting or feel worse.      Emergency Department Resource Guide 1) Find a Doctor and Pay Out of Pocket Although you won't have to find out who is covered by your insurance plan, it is a good idea to ask around and get recommendations. You will then need to call the office and see if the doctor you have chosen will accept you as a new patient and what types of options they offer for patients who are self-pay. Some doctors offer discounts or will set up payment plans for their patients who do not have insurance, but you will need to ask so you aren't surprised when you get to your appointment.  2) Contact Your Local Health Department Not all health departments have doctors that can see patients for sick visits, but many do, so it is worth a call to see if yours does. If you don't know where your local health department is, you can check in your phone book. The CDC also has a tool to help you locate your state's health department, and many state websites also have listings of all of their local health departments.  3) Find a [facility] If your illness is not likely to be very severe or complicated, you may want to try a walk in clinic. These are popping up all over the country in pharmacies, drugstores, and shopping centers. They're usually staffed by nurse practitioners or physician assistants that have been trained to treat common illnesses and complaints. They're usually fairly quick and inexpensive. However, if you have serious medical issues or chronic medical problems, these are probably not your best option.  No Primary Care Doctor: - Call Health Connect at  [phone] - they can help you locate a  primary care doctor that  accepts your insurance, provides certain services, etc. - Physician Referral Service- [phone]  Chronic Pain Problems: Organization         Address  Phone   Notes  [facility]  [phone] Patients need to be referred by their primary care doctor.   Medication Assistance: Organization         Address  Phone   Notes  [facility] Medication [facility] [address] [place], [place] [address] [phone] --Must be a resident of [facility] -- Must have NO insurance coverage whatsoever (no Medicaid/ Medicare, etc.) -- The pt. MUST have a primary care doctor that directs their care regularly and follows them in the community   MedAssist  [phone]   [employer]  [phone]    Agencies that provide inexpensive medical care: Organization         Address  Phone   Notes  [place]  [phone]   [facility]    [phone]   [facility] [place], [place] [address] [phone]   [place] [address] [place] [phone]   Planned Parenthood    ([phone]   [facility]    [phone]   [place] and [place], [place] Phone:  [phone],  Fax:  [phone] Hours of Operation:  9 am - 6 pm, M-F.  Also accepts Medicaid/Medicare and self-pay.  [facility] for [place], [address], [place] Phone: [phone], Fax: [phone]. Hours of Operation:  8:30 am - 5:30 pm, M-F.  Also accepts Medicaid and self-pay.  [facility] [place] [address], [place] Phone: [phone]   [place], [place], [place] [phone], Ext. [phone] Mondays & Thursdays: 7-9 AM.  First 15 patients are seen on a first come, first serve basis.    [place]  Providers:  Organization         Address  Phone   Notes  [facility] [address], [place] [phone] Also accepts self-pay patients.  [facility] [id number] [place], [place]  [phone]   [place], [address], [place] [phone]   [facility] [address], [place] [phone]   Lucianne Lei [address], [place]   [phone] Only accepts [place] Access [place] patients after they have their name applied to their card.   Self-Pay (no insurance) in [facility]:  Organization         Address  Phone   Notes  Sickle Cell Patients, [facility] [place] [phone]   [facility] [place] [phone]   [facility] [place]  [place], [place], [place] [phone]   [facility]/Dr. Osei-Bonsu  [address], [place] or [address], [place] [phone] Phone number for both [place] and [place] locations is the same.  Urgent Medical and [facility] [address], [place] [phone]   [facility] [address] [place] or [address] [phone]   [facility] [address], [place] [phone], phone; [phone], fax Sees patients 1st and 3rd Saturday of every month.  Must not qualify for public or private insurance (i.e. Medicaid, Medicare, [place] Health Choice, Veterans' Benefits)  Household income should be no more than 200% of the poverty level The clinic cannot treat you if you are pregnant or think you are pregnant  Sexually transmitted diseases are not treated at the clinic.    Dental Care: Organization         Address  Phone  Notes  [facility] of [facility] [place] [phone] Accepts children up to age 53 who are enrolled in [place] or [place]; pregnant women with a Medicaid card; and children who have applied for Medicaid or [place] Health Choice, but were declined, whose parents can pay a reduced fee at time of service.  [facility] Department of [facility]  [address], [place] [phone] Accepts children up to age 75 who are enrolled in [place] or [place]; pregnant women with a Medicaid card; and children who have applied for Medicaid or  Health Choice, but were declined, whose parents can pay a reduced fee at time of service.  [facility] Adult Dental Access PROGRAM  [place] [phone] Patients are seen by appointment only. Walk-ins are not  accepted. [place] will see patients 5 years of age and older. Monday - Tuesday (8am-5pm) Most Wednesdays (8:30-5pm) $30 per visit, cash only  [facility] Adult Dental Access PROGRAM  [address], [facility] [phone] Patients are seen by appointment only. Walk-ins are not accepted. [place] will see patients 38 years of age and older. One Wednesday Evening (Monthly: Volunteer Based).  $30 per visit, cash only  [place]  [phone] for adults; Children under age 23, call Graduate Pediatric Dentistry at [phone]. Children aged 74-14, please call [phone] to request a pediatric application.  Dental services are provided in all areas of dental care including fillings, crowns and bridges, complete and partial dentures, implants, gum treatment, root canals, and extractions. Preventive care is also provided. Treatment is provided to both adults and children. Patients are selected via a lottery and there is often a waiting list.   [facility] [address] [place]  [phone] [email or web address]   [facility]  [address] [place], [place] [phone], Ext. [phone] Second and Fourth Thursday of each month, opens at 6:30 AM; Clinic ends at 9 AM.  Patients are seen on a first-come first-served basis, and a limited number are seen during each clinic.   [facility]  [address] Hillard Danker [place], [place] [phone]   Eligibility Requirements You must have lived in [place], [place], or [place] counties for at least the last three months.   You cannot be eligible for state or federal sponsored [employer], including [employer], [place], or [employer].   You generally cannot be eligible for healthcare insurance through your employer.    How to apply: Eligibility screenings are held every Tuesday and Wednesday afternoon from 1:00 pm until 4:00 pm. You do not need an appointment for the interview!  [facility] [address], [place], [place]   [place]  [place] Department  [place]  [phone]    Behavioral Health Resources in the Community: Intensive Outpatient Programs Organization         Address  Phone  Notes  [place]. [address], [place], [place] [phone]   [facility] [address] [place], [place]   ADS: Alcohol & Drug Svcs [address] [place], [place]   [place] [address]  [place], [place] or [phone]   Substance Abuse Resources Organization         Address  Phone  Notes  Alcohol and Drug Services  [phone]   [place]  [phone]   [facility] [place]   Chinita Pester  [phone]   Residential & Outpatient Substance Abuse Program  [phone]   Psychological Services Organization         Address  Phone  Notes  [facility] [place]  [place]  [phone]   [place] [address] [place] or [phone]    Mobile Crisis Teams Organization         Address  Phone  Notes  [facility], Mobile Crisis Care Unit  [phone]   [facility]ive Psychotherapeutic Services  [address] [place], [place]   [facility] [address], [place] [phone]    Self-Help/Support Groups  Organization         Address  Phone             Notes  [facility]ental Health Assoc[facility] of [place] - variety of support groups  [place] for more information  Narcotics Anonymous (NA), [facility] [address], [employer] [place]  2 meetings at this location   Special educational needs teacher         Address  Phone  Notes  ASAP Residential Treatment [place],    [place]  [phone]   [facility]  [address], [place] [id number], [place], [place]   [place], [place] [phone] Admissions: 8am-3pm M-F  Incentives Substance [place] [address],    [place], [place] [phone]   [facility] [address] [place], [place], [place]   [facility] [address],  [place], [place]   [facility] Programs - Intensive Outpatient [address], [place], [place]   [facility] ([place] [address].,  [place], [place] [phone] or [phone]   [facility]idential Treatment Services (RTS) [address], [place], [place] Accepts Medicaid  [facility] [place] [address]  [place] [phone] Substance Abuse/Addiction Treatment   [facility] Organization         Address  Phone  Notes  [facility]  [phone]   Domenic Schwab, PhD [address] Arlis Porta [place], [place]   [phone] or [phone]    [facility] [place], [place] [phone]   [facility] [address], [place], [place] [phone] Insurance/Medicaid/sponsorship through [facility] and Families [address] [place]                                    [place], [place] [phone] [place] [address], [place] [phone]    Dr. Adele Schilder  [phone]   [facility] Dept. 1) [address] [place] 2) [place] 3)  [place] [address], [place] [phone]  [phone]   [place] [phone] or [phone] (After Hours)

## 2013-10-03 NOTE — Progress Notes (Signed)
This encounter was created in error - please disregard.

## 2013-10-05 ENCOUNTER — Other Ambulatory Visit: Payer: Self-pay

## 2013-10-11 ENCOUNTER — Encounter: Payer: Self-pay | Admitting: Endocrinology

## 2013-10-11 ENCOUNTER — Ambulatory Visit: Payer: Self-pay | Admitting: Endocrinology

## 2013-10-30 ENCOUNTER — Ambulatory Visit: Payer: Self-pay | Admitting: Endocrinology

## 2013-10-30 IMAGING — CR DG CHEST 2V
2 series · 2 of 2 positions shown · non-contrast
Comparison: 03/24/2010

CLINICAL DATA: Chest pain, hyperglycemia

CHEST - 2 VIEW

[w chest pa]
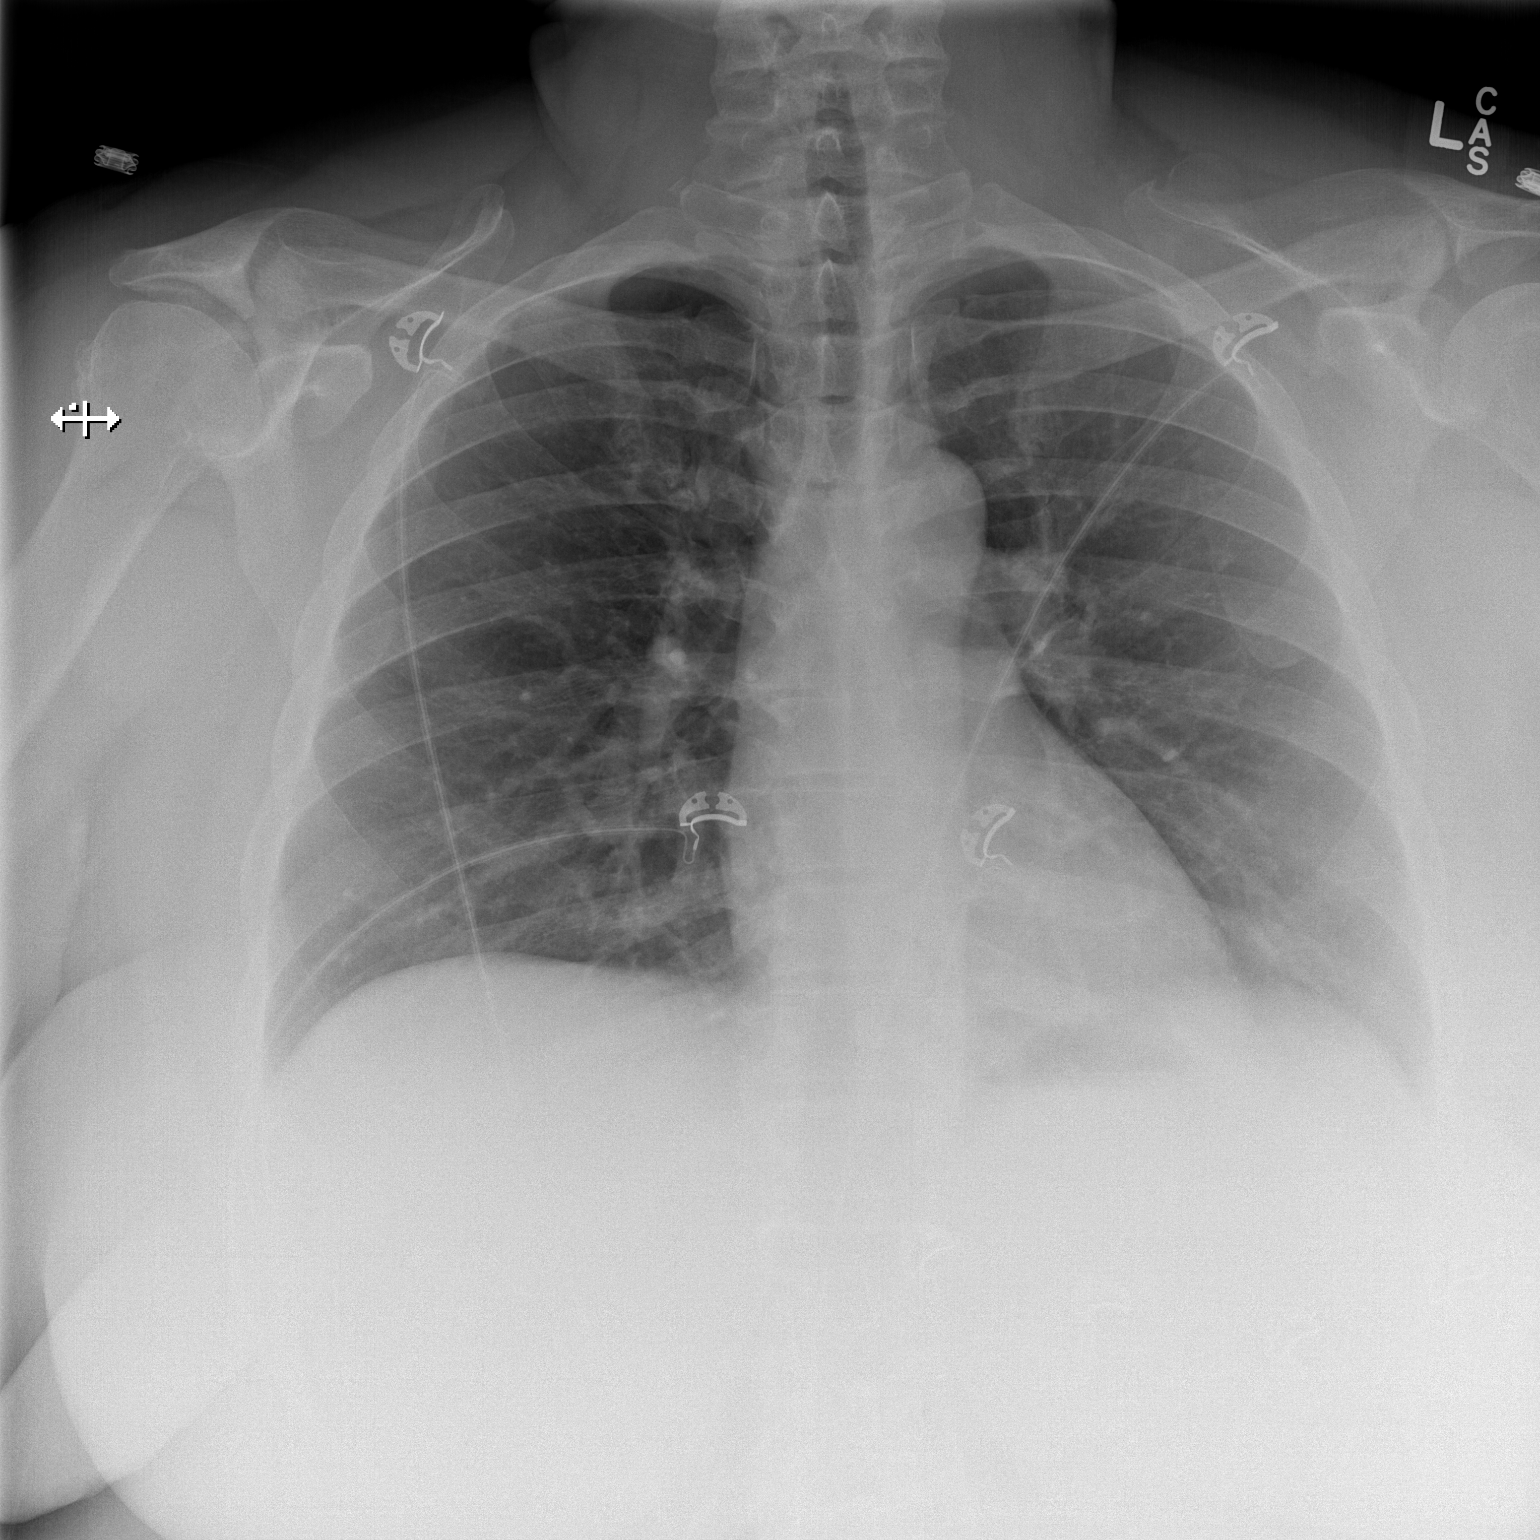

[w chest lat]
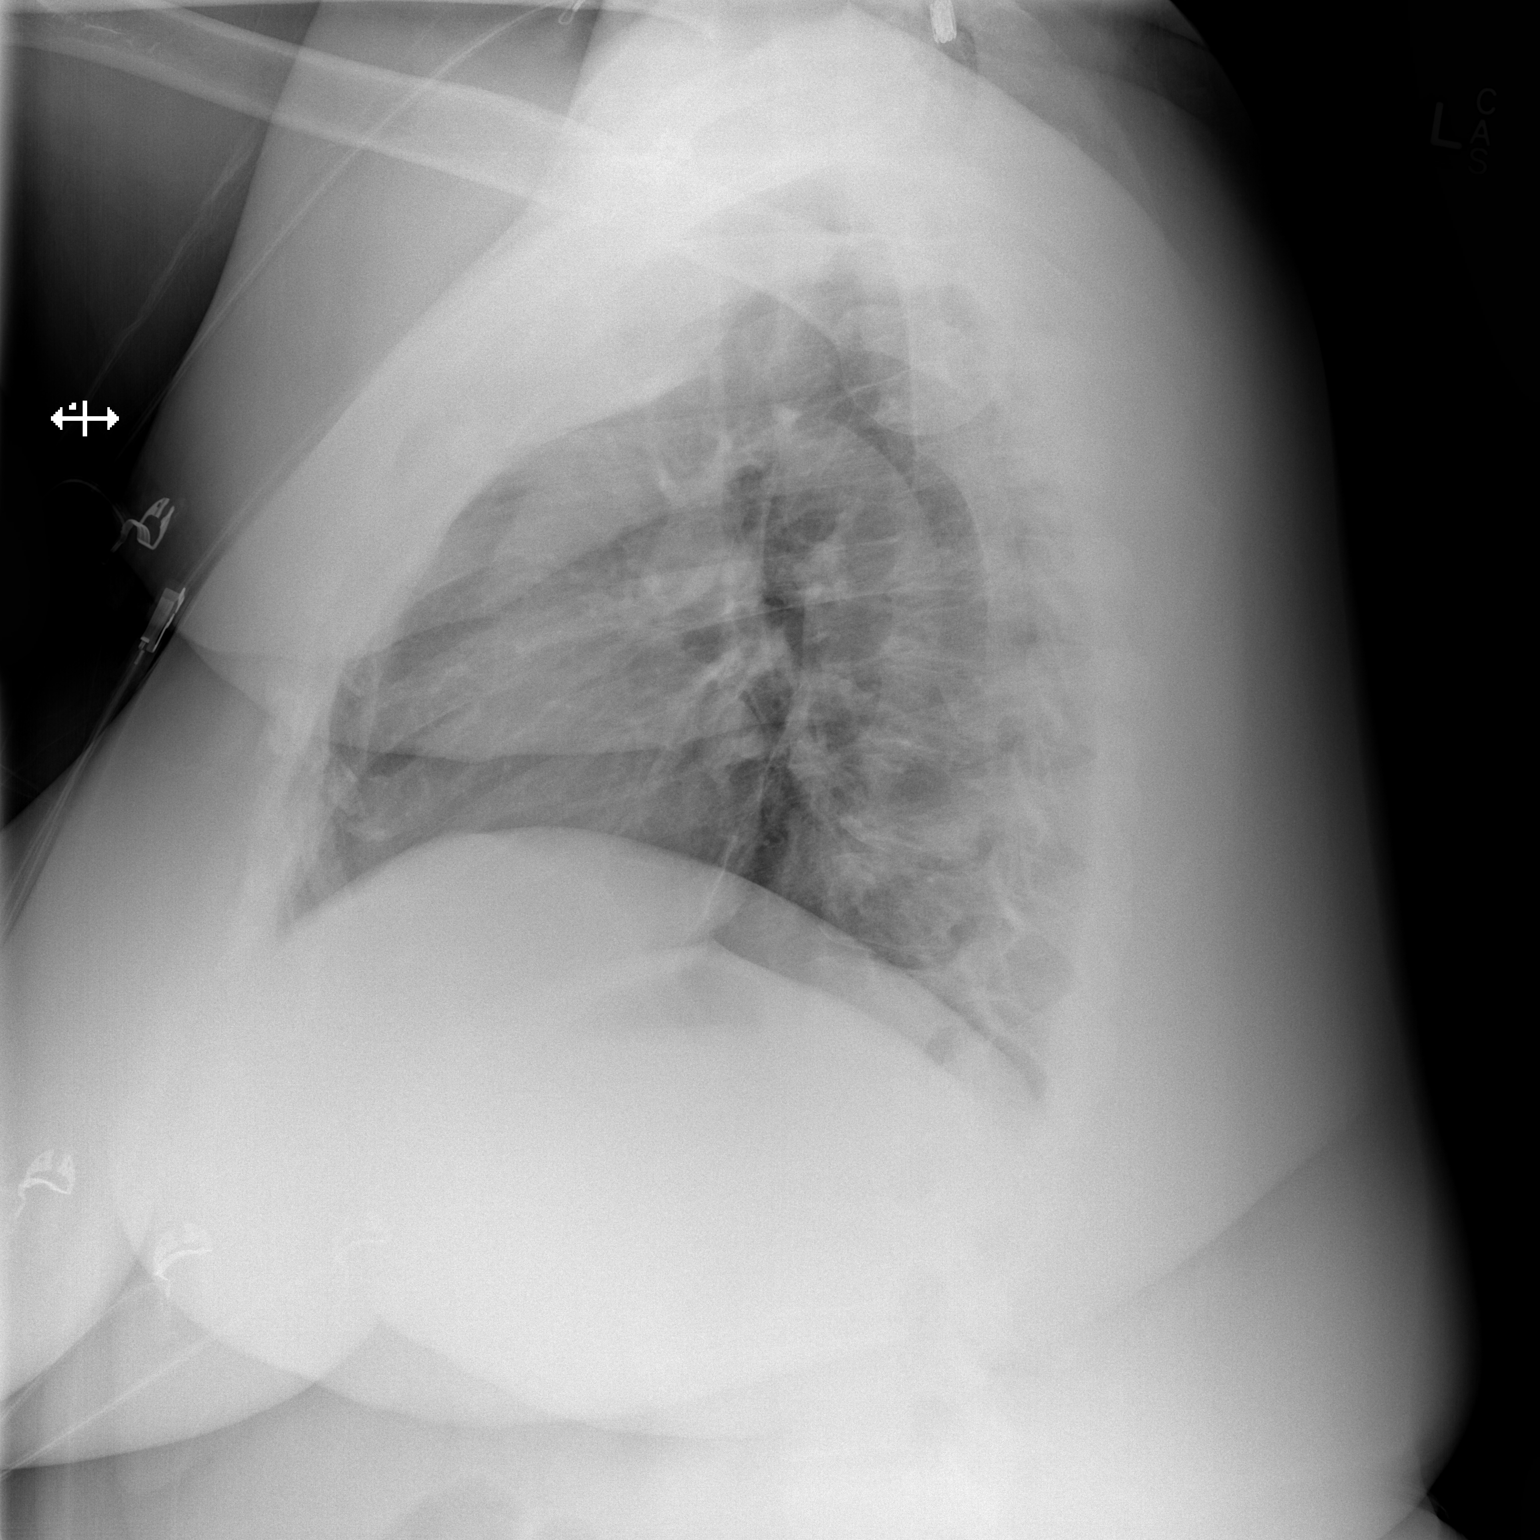

[2 of 2 positions shown; findings below may reference images not displayed]

FINDINGS: Cardiomediastinal silhouette is within normal limits. The
lungs are clear. No pleural effusion.  No pneumothorax.  No acute
osseous abnormality. Lung volumes are low with crowding of the
bronchovascular markings.
IMPRESSION: Normal chest, allowing for hypoaeration.

## 2013-10-31 IMAGING — CT CT ANGIO CHEST
2 series · 15 of 32 positions shown · IV contrast (OMNIPAQUE 350)
Comparison: Chest radiography same day

CLINICAL DATA: Chest pain.  Short of breath.  Elevated D-dimer.

CT ANGIOGRAPHY CHEST
TECHNIQUE: Multidetector CT imaging of the chest using the
standard protocol during bolus administration of intravenous
contrast. Multiplanar reconstructed images including MIPs were
obtained and reviewed to evaluate the vascular anatomy.
Contrast: 100mL OMNIPAQUE IOHEXOL 350 MG/ML SOLN

[Series 4: thick st · axial · 0.74mm/px · z∈[-88,+62]mm · 7 of 70 slices shown]
[im 10/70  lung]
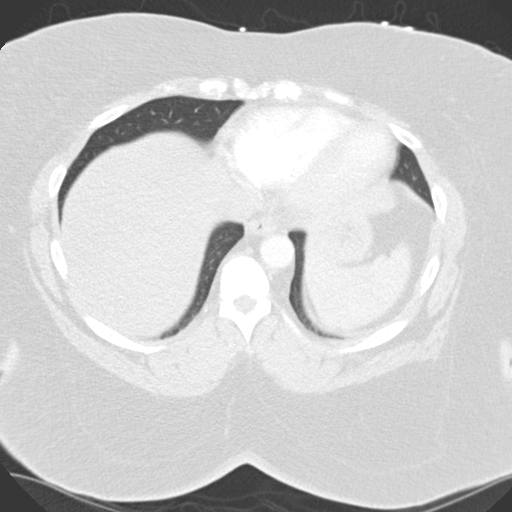
[im 20/70  mediastinal]
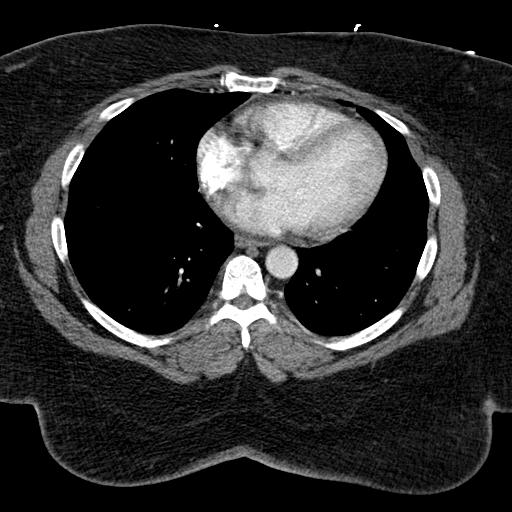
[im 30/70  lung]
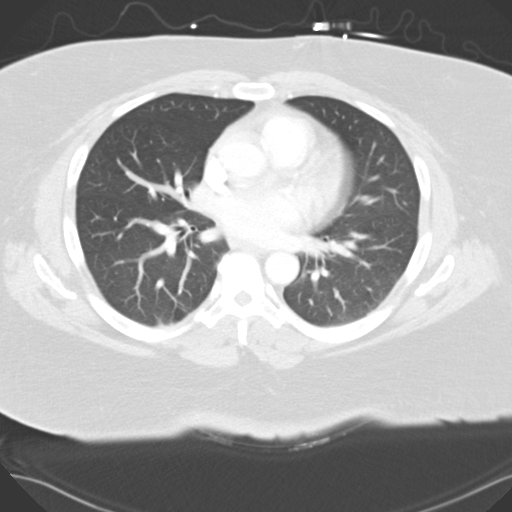
[im 33/70  mediastinal]
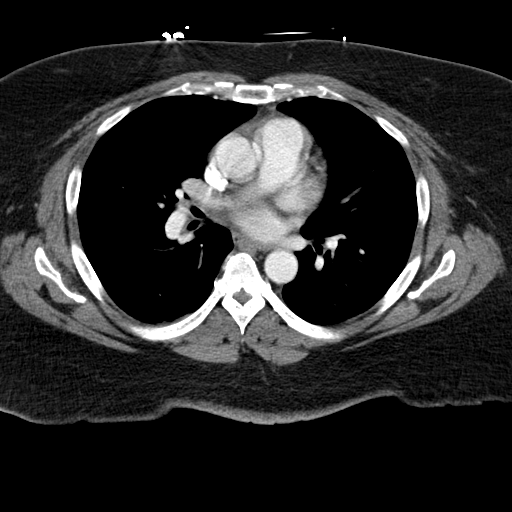
[im 40/70  lung]
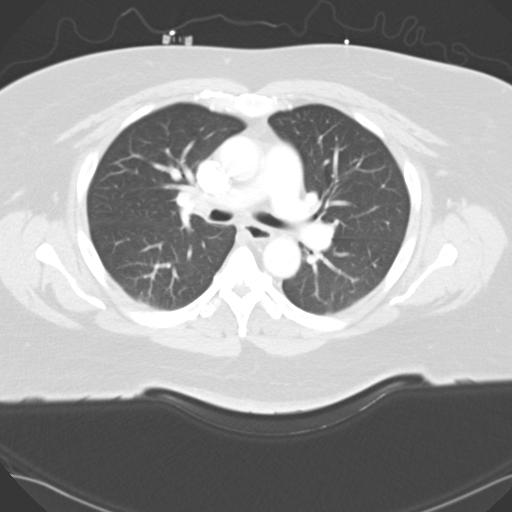
[im 50/70  mediastinal]
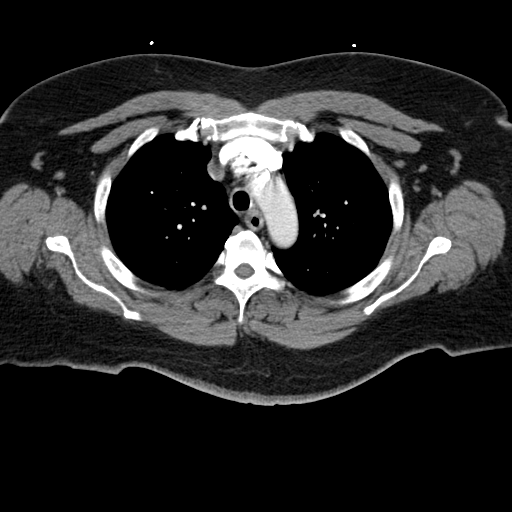
[im 60/70  lung]
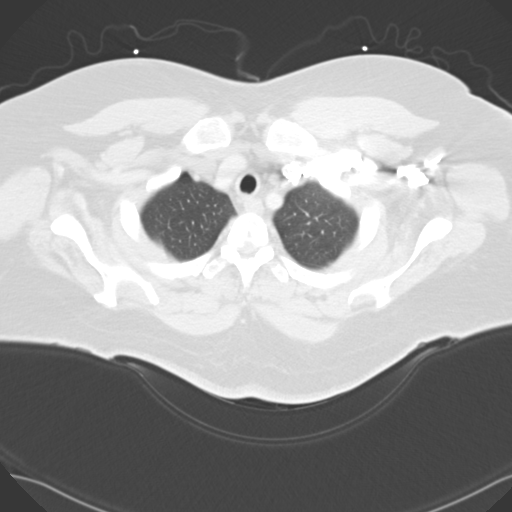

[Series 6: thins for pacs · axial · 0.74mm/px · z∈[-63,+73]mm · 8 of 174 slices shown]
[im 19/174  lung]
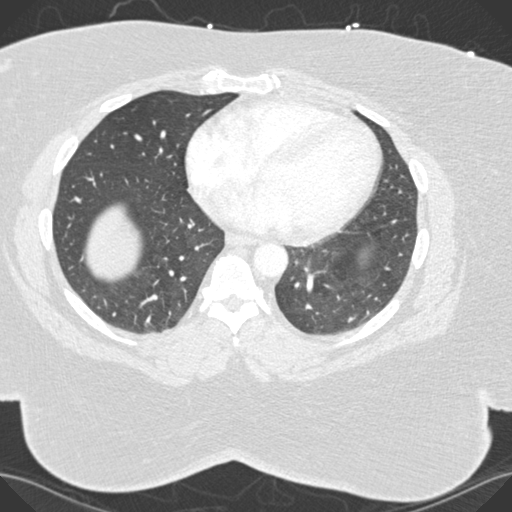
[im 37/174  lung]
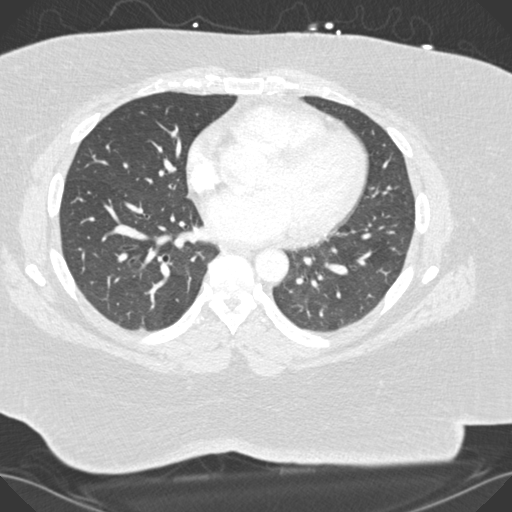
[im 58/174  lung]
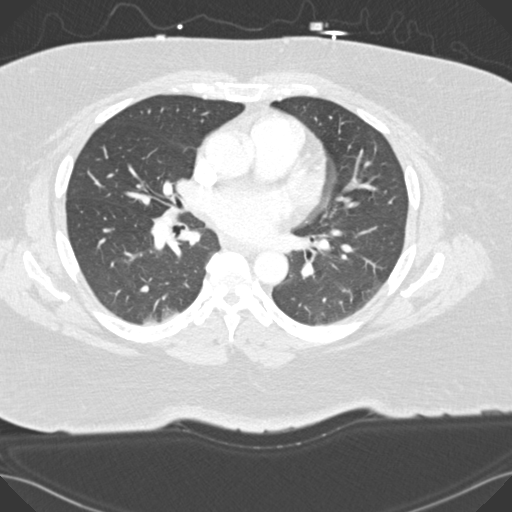
[im 73/174  lung]
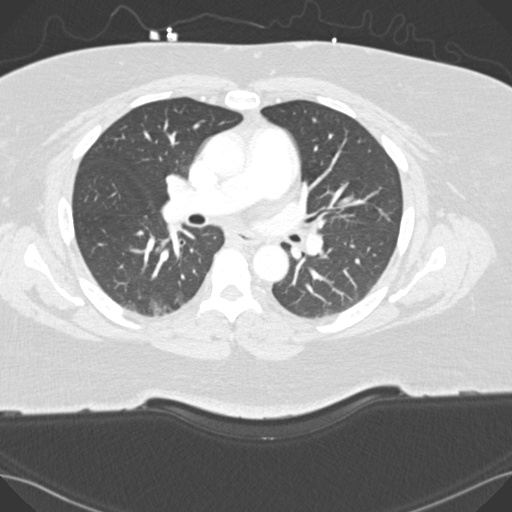
[im 92/174  lung]
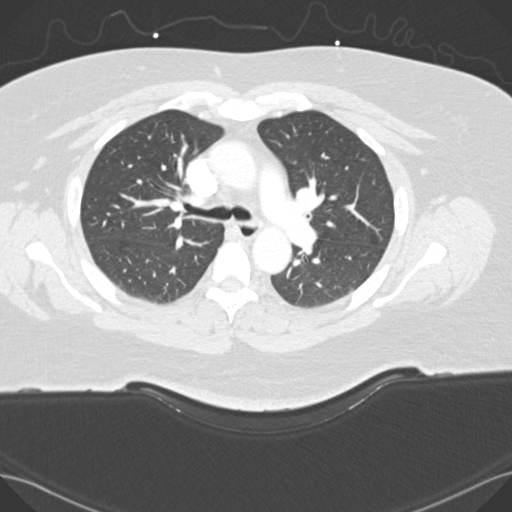
[im 116/174  lung]
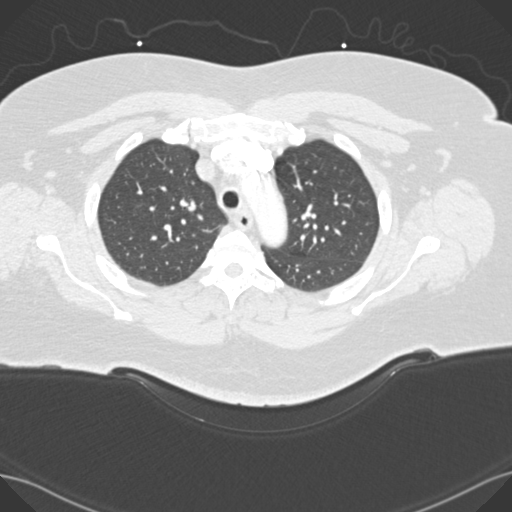
[im 137/174  lung]
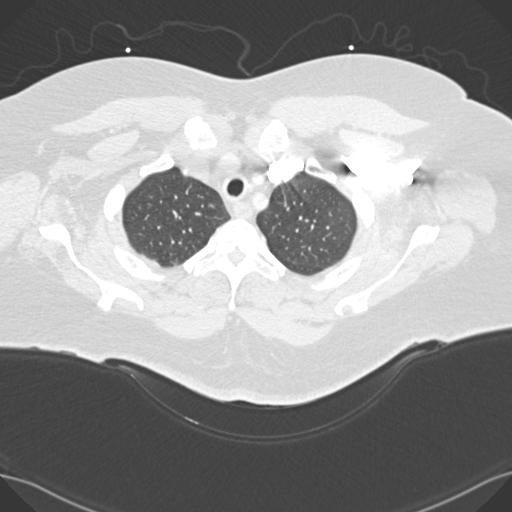
[im 155/174  lung]
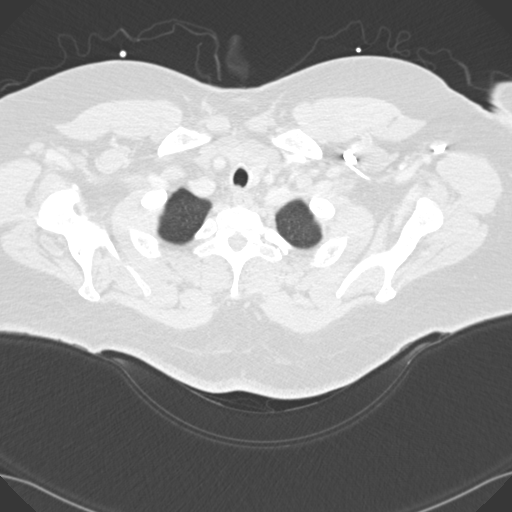

[15 of 32 positions shown; findings below may reference images not displayed]

FINDINGS: Pulmonary arterial opacification is moderate.  No sign of
pulmonary emboli.  No aortic pathology.  No coronary artery
calcification.  No hilar or mediastinal mass or adenopathy.  No
pleural or pericardial fluid.  The lungs are clear except for
minimal dependent volume loss, probably positional.  No spinal
pathology.
IMPRESSION: Negative exam.  No cause of the patient's described symptoms is
identified.  No pulmonary emboli.

## 2013-12-12 ENCOUNTER — Encounter (HOSPITAL_COMMUNITY): Payer: Self-pay | Admitting: Emergency Medicine

## 2013-12-12 ENCOUNTER — Emergency Department (HOSPITAL_COMMUNITY): Payer: 59

## 2013-12-12 ENCOUNTER — Emergency Department (HOSPITAL_COMMUNITY)
Admission: EM | Admit: 2013-12-12 | Discharge: 2013-12-12 | Disposition: A | Discharge status: Home / Self Care | Payer: 59 | Attending: Emergency Medicine | Admitting: Emergency Medicine

## 2013-12-12 DIAGNOSIS — Z794 Long term (current) use of insulin: Secondary | ICD-10-CM | POA: Diagnosis not present

## 2013-12-12 DIAGNOSIS — I10 Essential (primary) hypertension: Secondary | ICD-10-CM | POA: Insufficient documentation

## 2013-12-12 DIAGNOSIS — E119 Type 2 diabetes mellitus without complications: Secondary | ICD-10-CM | POA: Insufficient documentation

## 2013-12-12 DIAGNOSIS — R0602 Shortness of breath: Secondary | ICD-10-CM | POA: Diagnosis not present

## 2013-12-12 DIAGNOSIS — R2241 Localized swelling, mass and lump, right lower limb: Secondary | ICD-10-CM | POA: Diagnosis not present

## 2013-12-12 DIAGNOSIS — R0789 Other chest pain: Secondary | ICD-10-CM

## 2013-12-12 DIAGNOSIS — R079 Chest pain, unspecified: Secondary | ICD-10-CM | POA: Diagnosis present

## 2013-12-12 DIAGNOSIS — Z79899 Other long term (current) drug therapy: Secondary | ICD-10-CM | POA: Insufficient documentation

## 2013-12-12 DIAGNOSIS — M791 Myalgia: Secondary | ICD-10-CM | POA: Diagnosis not present

## 2013-12-12 DIAGNOSIS — Z880 Allergy status to penicillin: Secondary | ICD-10-CM | POA: Diagnosis not present

## 2013-12-12 DIAGNOSIS — Z8673 Personal history of transient ischemic attack (TIA), and cerebral infarction without residual deficits: Secondary | ICD-10-CM | POA: Diagnosis not present

## 2013-12-12 LAB — CBC
HEMATOCRIT: 31.6 % — AB (ref 36.0–46.0)
Hemoglobin: 10.4 g/dL — ABNORMAL LOW (ref 12.0–15.0)
MCH: 29.4 pg (ref 26.0–34.0)
MCHC: 32.9 g/dL (ref 30.0–36.0)
MCV: 89.3 fL (ref 78.0–100.0)
Platelets: 311 10*3/uL (ref 150–400)
RBC: 3.54 MIL/uL — ABNORMAL LOW (ref 3.87–5.11)
RDW: 13.6 % (ref 11.5–15.5)
WBC: 11.3 10*3/uL — ABNORMAL HIGH (ref 4.0–10.5)

## 2013-12-12 LAB — BASIC METABOLIC PANEL
Anion gap: 14 (ref 5–15)
BUN: 11 mg/dL (ref 6–23)
CO2: 22 mEq/L (ref 19–32)
CREATININE: 0.83 mg/dL (ref 0.50–1.10)
Calcium: 8.4 mg/dL (ref 8.4–10.5)
Chloride: 101 mEq/L (ref 96–112)
GFR calc Af Amer: >90 mL/min (ref >90)
GFR, EST NON AFRICAN AMERICAN: 81 mL/min — AB (ref >90)
Glucose, Bld: 260 mg/dL — ABNORMAL HIGH (ref 70–99)
POTASSIUM: 3.3 meq/L — AB (ref 3.7–5.3)
Sodium: 137 mEq/L (ref 137–147)

## 2013-12-12 LAB — I-STAT TROPONIN, ED: Troponin i, poc: 0 ng/mL (ref 0.00–0.08)

## 2013-12-12 LAB — CBG MONITORING, ED: Glucose-Capillary: 211 mg/dL — ABNORMAL HIGH (ref 70–99)

## 2013-12-12 LAB — PRO B NATRIURETIC PEPTIDE: Pro B Natriuretic peptide (BNP): 145.2 pg/mL — ABNORMAL HIGH (ref 0–125)

## 2013-12-12 MED ORDER — PREDNISONE 20 MG PO TABS: ORAL_TABLET | ORAL | Status: DC

## 2013-12-12 MED ORDER — IPRATROPIUM-ALBUTEROL 0.5-2.5 (3) MG/3ML IN SOLN
3.0000 mL | RESPIRATORY_TRACT | Status: DC
  Administered 2013-12-12: 3 mL via RESPIRATORY_TRACT
  Filled 2013-12-12: qty 3

## 2013-12-12 MED ORDER — ALBUTEROL SULFATE HFA 108 (90 BASE) MCG/ACT IN AERS
1.0000 | INHALATION_SPRAY | Freq: Four times a day (QID) | RESPIRATORY_TRACT | Status: DC | PRN
  Administered 2013-12-12: 1 via RESPIRATORY_TRACT
  Filled 2013-12-12: qty 6.7

## 2013-12-12 MED ORDER — HYDROCHLOROTHIAZIDE 12.5 MG PO TABS: 25.0000 mg | ORAL_TABLET | Freq: Every day | ORAL | Status: DC

## 2013-12-12 NOTE — ED Notes (Signed)
Pt reports gradual onset central chest pain x1 week, worse in last 3 days. Associated sob. Both worse with exertion. Sts she feels like she has fluid around her lungs, which she has a hx of that can usually be treated with meds. On HCTZ at home. Reports n/v x2 today. Denies diaphoresis assoc with pain. +lightheadedness, HA.

## 2013-12-12 NOTE — Progress Notes (Signed)
  CARE MANAGEMENT ED NOTE 12/12/2013  Patient:  LAETITIA, CANDELA   Account Number:  [id number]  Date Initiated:  12/12/2013  Documentation initiated by:  Livia Snellen  Subjective/Objective Assessment:   Patient presents to Ed with chest pain     Subjective/Objective Assessment Detail:     Action/Plan:   Action/Plan Detail:   Anticipated DC Date:       Status Recommendation to Physician:   Result of Recommendation:    Other ED [place]  Other  PCP issues    Choice offered to / List presented to:            Status of service:  Completed, signed off  ED Comments:   ED Comments Detail:  EDCM spoke to patient at bedside.  Patient confirms her pcp is Dr. Tamala Julian in [place] [facility] on [address]   Patient reports she has an appointment on October 16th. System updated

## 2013-12-12 NOTE — Discharge Instructions (Signed)

## 2013-12-12 NOTE — ED Notes (Signed)
Pt. CBG 211. Notified PA,Cartner.

## 2013-12-12 NOTE — ED Provider Notes (Signed)
[name]: [id number]     Arrival date & time 12/12/13  1905 History   First MD Initiated Contact with Patient 12/12/13 2026     Chief Complaint  Patient presents with  . Chest Pain  . [name]     (Consider location/radiation/quality/duration/timing/severity/associated sxs/prior Treatment) HPI Deborah [name] is a 48 y.o. female with a history of CVA, hypertension and diabetes who comes in for evaluation of chest pain and shortness of breath. Patient states she has had a constant, sharp tightness in the middle of her chest with associated shortness of breath for the past week, but has gotten worse over the last 3 days. It is present at rest but gets worse with exertion. She also reports a cough with generalized body aches and swelling. She tried Mucinex for the cough without relief. She attributes the swelling to running out of her HCTZ 2 days ago. She reports having an appointment with her primary care on October 16 and she will have her medication refilled. She is concerned because she is felt like this before and is afraid she may have fluid on her lungs. She denies fevers, nausea vomiting, abdominal pain, diarrhea constipation, new numbness or weakness, syncope. She reports having intermittent weakness on her right side as a result of her previous CVA.   Past Medical History  Diagnosis Date  . Diabetes mellitus   . Hypertension   . Stroke 02/2013   Past Surgical History  Procedure Laterality Date  . No past surgeries     Family History  Problem Relation Age of Onset  . Diabetes Mother   . Diabetes Father   . Heart disease Father   . Diabetes Sister   . Diabetes Brother    History  Substance Use Topics  . Smoking status: Never Smoker   . Smokeless tobacco: Never Used  . Alcohol Use: No   OB History   Grav Para Term Preterm Abortions TAB SAB Ect Mult Living                 Review of Systems  Constitutional: Negative for fever.  HENT: Negative for sore  throat.   Eyes: Negative for visual disturbance.  Respiratory: Positive for cough, chest tightness and shortness of breath.   Cardiovascular: Positive for leg swelling.  Gastrointestinal: Negative for abdominal pain.  Endocrine: Negative for polyuria.  Genitourinary: Negative for dysuria.  Musculoskeletal: Positive for myalgias.  Skin: Negative for rash.  Neurological: Negative for headaches.      Allergies  Penicillins; Valium; Lisinopril; and Food  Home Medications   Prior to Admission medications   Medication Sig Start Date End Date Taking? Authorizing Provider  amLODipine (NORVASC) 5 MG tablet Take 1 tablet (5 mg total) by mouth daily. 09/27/13  Yes Bynum Bellows, MD  Cetirizine HCl (ZYRTEC PO) Take 1 tablet by mouth daily.   Yes Historical Provider, MD  hydrochlorothiazide (HYDRODIURIL) 25 MG tablet Take 1 tablet (25 mg total) by mouth daily. 09/27/13  Yes Bynum Bellows, MD  ibuprofen (ADVIL,MOTRIN) 200 MG tablet Take 400 mg by mouth daily as needed for moderate pain.   Yes Historical Provider, MD  insulin lispro protamine-lispro (HUMALOG MIX 75/25) (75-25) 100 UNIT/ML SUSP injection Inject 75 units [place] in the morning , 50 units [place] with lunch, 75 units [name] with supper 09/28/13  Yes Bynum Bellows, MD  ranitidine (ZANTAC) 150 MG tablet Take 150 mg by mouth daily.   Yes Historical Provider, MD   BP 151/92  Pulse 91  Resp 22  SpO2 100% Physical Exam  Nursing note and vitals reviewed. Constitutional: She is oriented to person, place, and time. She appears well-developed and well-nourished.  HENT:  Head: Normocephalic and atraumatic.  Mouth/Throat: Oropharynx is clear and moist. No oropharyngeal exudate.  Eyes: Conjunctivae are normal. Pupils are equal, round, and reactive to light. Right eye exhibits no discharge. Left eye exhibits no discharge. No scleral icterus.  Neck: Neck supple.  Cardiovascular: Normal rate, regular rhythm and normal heart sounds.   Pulmonary/Chest:  Effort normal and breath sounds normal. No respiratory distress. She has no wheezes. She has no rales.  Patient appears to have increased effort to breathe. Mild expiratory wheezes diffusely in all lung fields. No other adventitious lung sounds appreciated Chest tightness is replicated with sternal palpation and is also exacerbated by coughing.  Abdominal: Soft. There is no tenderness.  Musculoskeletal: She exhibits no tenderness.  Diffuse edema with 1+ pretibial edema bilaterally. No erythema or overt skin tightness.  Neurological: She is alert and oriented to person, place, and time.  Cranial Nerves II-XII grossly intact  Skin: Skin is warm and dry. No rash noted.  Psychiatric: She has a normal mood and affect.    ED Course  Procedures (including critical care time) Labs Review Labs Reviewed  CBC - Abnormal; Notable for the following:    WBC 11.3 (*)    RBC 3.54 (*)    Hemoglobin 10.4 (*)    HCT 31.6 (*)    All other components within normal limits  BASIC METABOLIC PANEL - Abnormal; Notable for the following:    Potassium 3.3 (*)    Glucose, Bld 260 (*)    GFR calc non Af Amer 81 (*)    All other components within normal limits  CBG MONITORING, ED - Abnormal; Notable for the following:    Glucose-Capillary 211 (*)    All other components within normal limits  PRO B NATRIURETIC PEPTIDE  I-STAT TROPOININ, ED    Imaging Review No results found.   EKG Interpretation None     Meds given in ED:  Medications  ipratropium-albuterol (DUONEB) 0.5-2.5 (3) MG/3ML nebulizer solution 3 mL (not administered)    New Prescriptions   No medications on file   Filed Vitals:   12/12/13 1925  BP: 151/92  Pulse: 91  Resp: 22  SpO2: 100%    MDM  Vitals stable  -afebrile- 100% sat Pt resting comfortably in ED. PE--shortness of breath, wheezing, chest discomfort resolved after DuoNeb treatment. The chronic nature of patient's pain not consistent with unstable angina or ACS.  Patient is PERC negative as well as Well's negative. Symptomology likely due to viral respiratory etiology. Patient reports having a primary care followup on the 16th.    Labwork noncontributory  Imaging shows nonspecific pulmonary congestion, not concerning for acute pathology at this time.   Will DC with albuterol inhaler as well as steroids. Also refilled patient's HCTZ until her primary care appointment. Discussed f/u with PCP and return precautions, pt very amenable to plan.  patient stable, in good condition and is appropriate for discharge  Prior to patient discharge, I discussed and reviewed this case with Dr.Harrison     Final diagnoses:  Other chest pain  SOB (shortness of breath)        Verl Dicker, PA-C 12/13/13 [phone]

## 2013-12-13 NOTE — ED Provider Notes (Signed)
Medical screening examination/treatment/procedure(s) were conducted as a shared visit with non-physician practitioner(s) and myself.  I personally evaluated the patient during the encounter.   EKG Interpretation   Date/Time:  Wednesday December 12 2013 19:24:54 EDT Ventricular Rate:  94 PR Interval:  172 QRS Duration: 87 QT Interval:  367 QTC Calculation: 459 R Axis:   -42 Text Interpretation:  Sinus rhythm Left anterior fascicular block Abnormal  R-wave progression, late transition LVH by voltage Baseline wander in  lead(s) V1 No significant change since last tracing Confirmed by [place] ([id number]) on 12/12/2013 9:10:17 PM      I interviewed and examined the patient. Lungs are CTAB on my exam s/p breathing tx, pt initially wheezing per PA. Cardiac exam wnl. Abdomen soft.  Pt's cp is constant and unwavering for 1 week per her description. Atypical for cardiac. Sx moderately relieved here w/ a breathing tx. Possibly component of reactive airway dis. Low risk Wells, neg PERC.  I interpreted/reviewed the labs and/or imaging which were non-contributory.  Rec close f/u w/ pcp.   Pamella Pert, MD 12/13/13 [phone]

## 2014-01-06 HISTORY — PX: CATARACT EXTRACTION: SUR2

## 2014-03-20 ENCOUNTER — Encounter (HOSPITAL_COMMUNITY): Payer: Self-pay | Admitting: Emergency Medicine

## 2014-03-20 ENCOUNTER — Emergency Department (HOSPITAL_COMMUNITY)
Admission: EM | Admit: 2014-03-20 | Discharge: 2014-03-21 | Disposition: A | Discharge status: Home / Self Care | Payer: 59 | Attending: Emergency Medicine | Admitting: Emergency Medicine

## 2014-03-20 ENCOUNTER — Emergency Department (HOSPITAL_COMMUNITY): Payer: 59

## 2014-03-20 DIAGNOSIS — M25474 Effusion, right foot: Secondary | ICD-10-CM | POA: Insufficient documentation

## 2014-03-20 DIAGNOSIS — Z79899 Other long term (current) drug therapy: Secondary | ICD-10-CM | POA: Diagnosis not present

## 2014-03-20 DIAGNOSIS — Z8673 Personal history of transient ischemic attack (TIA), and cerebral infarction without residual deficits: Secondary | ICD-10-CM | POA: Insufficient documentation

## 2014-03-20 DIAGNOSIS — Z794 Long term (current) use of insulin: Secondary | ICD-10-CM | POA: Diagnosis not present

## 2014-03-20 DIAGNOSIS — E119 Type 2 diabetes mellitus without complications: Secondary | ICD-10-CM | POA: Insufficient documentation

## 2014-03-20 DIAGNOSIS — I10 Essential (primary) hypertension: Secondary | ICD-10-CM | POA: Diagnosis not present

## 2014-03-20 DIAGNOSIS — Z880 Allergy status to penicillin: Secondary | ICD-10-CM | POA: Insufficient documentation

## 2014-03-20 DIAGNOSIS — R52 Pain, unspecified: Secondary | ICD-10-CM

## 2014-03-20 DIAGNOSIS — Z7982 Long term (current) use of aspirin: Secondary | ICD-10-CM | POA: Diagnosis not present

## 2014-03-20 DIAGNOSIS — R2241 Localized swelling, mass and lump, right lower limb: Secondary | ICD-10-CM | POA: Diagnosis present

## 2014-03-20 DIAGNOSIS — Z7952 Long term (current) use of systemic steroids: Secondary | ICD-10-CM | POA: Diagnosis not present

## 2014-03-20 LAB — CBG MONITORING, ED: Glucose-Capillary: 147 mg/dL — ABNORMAL HIGH (ref 70–99)

## 2014-03-20 MED ORDER — HYDROCODONE-ACETAMINOPHEN 5-325 MG PO TABS
2.0000 | ORAL_TABLET | Freq: Once | ORAL | Status: AC
  Administered 2014-03-21: 2 via ORAL
  Filled 2014-03-20: qty 2

## 2014-03-20 MED ORDER — NAPROXEN 500 MG PO TABS
500.0000 mg | ORAL_TABLET | Freq: Once | ORAL | Status: AC
  Administered 2014-03-21: 500 mg via ORAL
  Filled 2014-03-20: qty 1

## 2014-03-20 NOTE — ED Provider Notes (Signed)
[name]: [id number]     Arrival date & time 03/20/14  1943 History   First MD Initiated Contact with Patient 03/20/14 2326     Chief Complaint  Patient presents with  . Leg Swelling     (Consider location/radiation/quality/duration/timing/severity/associated sxs/prior Treatment) HPI The patient reports she started developing pain in her right foot 6 days ago. She reports it was gradual in onset. Most of the pain is around the area of her arch and the top of her foot. She reports that she started to get swelling in the foot as well. She reports is weightbearing the pain radiates up her ankle and her leg almost to her knee. She reports however her knee is not painful and she's had no swelling in the knee. She denies pain in her calf or the soft tissues of her leg except as relates to weightbearing. The patient denies redness. She denies she's had fevers or chills. She reports that the pain has increased daily and now is very hard for her to "weight on it. She reports that at work today she mostly remained seated. Past Medical History  Diagnosis Date  . Diabetes mellitus   . Hypertension   . Stroke 02/2013   Past Surgical History  Procedure Laterality Date  . No past surgeries     Family History  Problem Relation Age of Onset  . Diabetes Mother   . Diabetes Father   . Heart disease Father   . Diabetes Sister   . Diabetes Brother    History  Substance Use Topics  . Smoking status: Never Smoker   . Smokeless tobacco: Never Used  . Alcohol Use: No   OB History    No data available     Review of Systems Cardiovascular: No chest pain no shortness of breath Constitutional: No fevers no general malaise   Allergies  Penicillins; Valium; Lisinopril; and Food  Home Medications   Prior to Admission medications   Medication Sig Start Date End Date Taking? Authorizing Provider  albuterol (PROVENTIL HFA;VENTOLIN HFA) 108 (90 BASE) MCG/ACT inhaler Inhale 2 puffs into the lungs every 6  (six) hours as needed for wheezing or shortness of breath.   Yes Historical Provider, MD  amLODipine (NORVASC) 5 MG tablet Take 1 tablet (5 mg total) by mouth daily. 09/27/13  Yes Haydee Monica, MD  aspirin 81 MG tablet Take 81 mg by mouth daily.   Yes Historical Provider, MD  Cetirizine HCl (ZYRTEC PO) Take 1 tablet by mouth daily.   Yes Historical Provider, MD  hydrochlorothiazide (HYDRODIURIL) 25 MG tablet Take 1 tablet (25 mg total) by mouth daily. 09/27/13  Yes Radhika P Phadke, MD  ibuprofen (ADVIL,MOTRIN) 200 MG tablet Take 400 mg by mouth daily as needed for moderate pain.   Yes Historical Provider, MD  insulin lispro protamine-lispro (HUMALOG MIX 75/25) (75-25) 100 UNIT/ML SUSP injection Inject 75 units [place] in the morning , 50 units [place] with lunch, 75 units [place] with supper 09/28/13  Yes Radhika P Phadke, MD  metoprolol succinate (TOPROL-XL) 25 MG 24 hr tablet Take 25 mg by mouth daily.   Yes Historical Provider, MD  ranitidine (ZANTAC) 150 MG tablet Take 150 mg by mouth daily.   Yes Historical Provider, MD  hydrochlorothiazide (HYDRODIURIL) 12.5 MG tablet Take 2 tablets (25 mg total) by mouth daily. Patient not taking: Reported on 03/20/2014 12/12/13   Viona Gilmore Cartner, PA-C  HYDROcodone-acetaminophen (NORCO/VICODIN) 5-325 MG per tablet Take 1-2 tablets by mouth every 4 (four)  hours as needed for moderate pain or severe pain. 03/21/14   Charlesetta Shanks, MD  naproxen (NAPROSYN) 375 MG tablet Take 1 tablet (375 mg total) by mouth 2 (two) times daily. 03/21/14   Charlesetta Shanks, MD  predniSONE (DELTASONE) 20 MG tablet 3 tabs po day one, then 2 tabs daily x 4 days Patient not taking: Reported on 03/20/2014 12/12/13   Viona Gilmore Cartner, PA-C   BP 141/68 mmHg  Pulse 100  Temp(Src) 98.3 F (36.8 C) (Oral)  Resp 18  SpO2 98% Physical Exam  Constitutional: She is oriented to person, place, and time.  Patient normally obese. She is alert and nontoxic.  HENT:  Head: Normocephalic and atraumatic.   Eyes: EOM are normal.  Cardiovascular: Normal rate, regular rhythm, normal heart sounds and intact distal pulses.   Pulmonary/Chest: Effort normal and breath sounds normal. No respiratory distress.  Musculoskeletal:  Right foot has moderate diffuse edema of the foot but not the ankle. The patient is tender to palpation over the arch predominantly on the dorsal surface and laterally. The toes are nontender and warm and dry. The patient has normal range of motion of the toes without swelling or erythema of them. There are no apparent wounds on the foot. No erythema. The calf is soft and nontender. There is no streaking up the leg. The knee has no effusion and no tenderness to palpation or range of motion.  Neurological: She is alert and oriented to person, place, and time.  Skin: Skin is warm and dry.  Psychiatric: She has a normal mood and affect.    ED Course  Procedures (including critical care time) Labs Review Labs Reviewed  CBG MONITORING, ED - Abnormal; Notable for the following:    Glucose-Capillary 147 (*)    All other components within normal limits    Imaging Review Dg Foot Complete Right  03/21/2014   CLINICAL DATA:  Acute onset of right foot and lower leg swelling and pain. Initial encounter.  EXAM: RIGHT FOOT COMPLETE - 3+ VIEW  COMPARISON:  None.  FINDINGS: There is no evidence of fracture or dislocation. There is chronic deformity involving the proximal metacarpals. A small osseous fragment adjacent to the first tarsometatarsal joint is likely degenerative in nature. Mild degenerative change is seen about the navicular and anterior talus. The joint spaces are preserved. There is no evidence of talar subluxation; the subtalar joint is unremarkable in appearance. A plantar calcaneal spur is seen.  Diffuse soft tissue swelling is noted about the midfoot and forefoot.  IMPRESSION: 1. No evidence of fracture or dislocation. 2. Mild degenerative change noted about the midfoot. 3.  Diffuse soft tissue swelling about the midfoot and forefoot.   Electronically Signed   By: Garald Balding M.D.   On: 03/21/2014 00:33     EKG Interpretation None      MDM   Final diagnoses:  Pain aggravated by standing  Swelling of foot joint, right    The patient has swelling without erythema. The pain is localizing to the forefoot. There is no sign of infectious etiology at this point. I have suspicion for possible midfoot strain or development of early degenerative joint disease or possibly Charcot joint as the patient is diabetic. At this point she will be treated for pain and recommended to elevate and minimize weightbearing. She is advised to follow-up with podiatry.    Charlesetta Shanks, MD 03/21/14 0100

## 2014-03-20 NOTE — ED Notes (Signed)
Pt reports R foot and lower leg swelling and pain since 1/2. Pt denies any known injuries, hx of blood clots, or smoking hx. Pt states that pain goes up to her R knee. Pt states that she has become unable to bear weight. No warmth or redness noted.

## 2014-03-21 MED ORDER — HYDROCODONE-ACETAMINOPHEN 5-325 MG PO TABS: 1.0000 | ORAL_TABLET | ORAL | Status: DC | PRN

## 2014-03-21 MED ORDER — NAPROXEN 375 MG PO TABS
375.0000 mg | ORAL_TABLET | Freq: Two times a day (BID) | ORAL | Status: DC
Start: 2014-03-21 — End: 2014-07-19

## 2014-03-21 NOTE — ED Notes (Signed)
Patient A&O x4, respirations even and unlabored, mild swelling noted to right calf compared to left calf, warm to touch, pedal pulses located via dopplar, cap refill <3 seconds. Rates pain 9/10. Provided warm blanket. Denies further needs at this time.

## 2014-03-21 NOTE — Discharge Instructions (Signed)
Possible Foot Sprain  Your pain may be due to sprain or degenerative joint disease of the foot. Wear post op shoe and elevate as much as possible.  The muscles and cord like structures which attach muscle to bone (tendons) that surround the feet are made up of units. A foot sprain can occur at the weakest spot in any of these units. This condition is most often caused by injury to or overuse of the foot, as from playing contact sports, or aggravating a previous injury, or from poor conditioning, or obesity. SYMPTOMS  Pain with movement of the foot.  Tenderness and swelling at the injury site.  Loss of strength is present in moderate or severe sprains. THE THREE GRADES OR SEVERITY OF FOOT SPRAIN ARE:  Mild (Grade I): Slightly pulled muscle without tearing of muscle or tendon fibers or loss of strength.  Moderate (Grade II): Tearing of fibers in a muscle, tendon, or at the attachment to bone, with small decrease in strength.  Severe (Grade III): Rupture of the muscle-tendon-bone attachment, with separation of fibers. Severe sprain requires surgical repair. Often repeating (chronic) sprains are caused by overuse. Sudden (acute) sprains are caused by direct injury or over-use. DIAGNOSIS  Diagnosis of this condition is usually by your own observation. If problems continue, a caregiver may be required for further evaluation and treatment. X-rays may be required to make sure there are not breaks in the bones (fractures) present. Continued problems may require physical therapy for treatment. PREVENTION  Use strength and conditioning exercises appropriate for your sport.  Warm up properly prior to working out.  Use athletic shoes that are made for the sport you are participating in.  Allow adequate time for healing. Early return to activities makes repeat injury more likely, and can lead to an unstable arthritic foot that can result in prolonged disability. Mild sprains generally heal in 3 to 10  days, with moderate and severe sprains taking 2 to 10 weeks. Your caregiver can help you determine the proper time required for healing. HOME CARE INSTRUCTIONS   Apply ice to the injury for 15-20 minutes, 03-04 times per day. Put the ice in a plastic bag and place a towel between the bag of ice and your skin.  An elastic wrap (like an Ace bandage) may be used to keep swelling down.  Keep foot above the level of the heart, or at least raised on a footstool, when swelling and pain are present.  Try to avoid use other than gentle range of motion while the foot is painful. Do not resume use until instructed by your caregiver. Then begin use gradually, not increasing use to the point of pain. If pain does develop, decrease use and continue the above measures, gradually increasing activities that do not cause discomfort, until you gradually achieve normal use.  Use crutches if and as instructed, and for the length of time instructed.  Keep injured foot and ankle wrapped between treatments.  Massage foot and ankle for comfort and to keep swelling down. Massage from the toes up towards the knee.  Only take over-the-counter or prescription medicines for pain, discomfort, or fever as directed by your caregiver. SEEK IMMEDIATE MEDICAL CARE IF:   Your pain and swelling increase, or pain is not controlled with medications.  You have loss of feeling in your foot or your foot turns cold or blue.  You develop new, unexplained symptoms, or an increase of the symptoms that brought you to your caregiver. MAKE SURE YOU:  Understand these instructions.  Will watch your condition.  Will get help right away if you are not doing well or get worse. Document Released: 08/14/2001 Document Revised: 05/17/2011 Document Reviewed: 10/12/2007 [facility] Patient Information 2015 [place], [place]. This information is not intended to replace advice given to you by your health care provider. Make sure you discuss any  questions you have with your health care provider.

## 2014-07-19 ENCOUNTER — Emergency Department (HOSPITAL_BASED_OUTPATIENT_CLINIC_OR_DEPARTMENT_OTHER): Payer: BLUE CROSS/BLUE SHIELD

## 2014-07-19 ENCOUNTER — Encounter (HOSPITAL_COMMUNITY): Payer: Self-pay | Admitting: Emergency Medicine

## 2014-07-19 ENCOUNTER — Emergency Department (HOSPITAL_COMMUNITY)
Admission: EM | Admit: 2014-07-19 | Discharge: 2014-07-19 | Disposition: A | Discharge status: Home / Self Care | Payer: BLUE CROSS/BLUE SHIELD | Attending: Emergency Medicine | Admitting: Emergency Medicine

## 2014-07-19 ENCOUNTER — Emergency Department (HOSPITAL_COMMUNITY): Payer: BLUE CROSS/BLUE SHIELD

## 2014-07-19 DIAGNOSIS — M79671 Pain in right foot: Secondary | ICD-10-CM | POA: Diagnosis not present

## 2014-07-19 DIAGNOSIS — Z794 Long term (current) use of insulin: Secondary | ICD-10-CM | POA: Insufficient documentation

## 2014-07-19 DIAGNOSIS — Z8673 Personal history of transient ischemic attack (TIA), and cerebral infarction without residual deficits: Secondary | ICD-10-CM | POA: Diagnosis not present

## 2014-07-19 DIAGNOSIS — I10 Essential (primary) hypertension: Secondary | ICD-10-CM | POA: Diagnosis not present

## 2014-07-19 DIAGNOSIS — Z79899 Other long term (current) drug therapy: Secondary | ICD-10-CM | POA: Insufficient documentation

## 2014-07-19 DIAGNOSIS — Z791 Long term (current) use of non-steroidal anti-inflammatories (NSAID): Secondary | ICD-10-CM | POA: Diagnosis not present

## 2014-07-19 DIAGNOSIS — Z880 Allergy status to penicillin: Secondary | ICD-10-CM | POA: Insufficient documentation

## 2014-07-19 DIAGNOSIS — M79609 Pain in unspecified limb: Secondary | ICD-10-CM

## 2014-07-19 DIAGNOSIS — E119 Type 2 diabetes mellitus without complications: Secondary | ICD-10-CM | POA: Insufficient documentation

## 2014-07-19 DIAGNOSIS — M7989 Other specified soft tissue disorders: Secondary | ICD-10-CM

## 2014-07-19 DIAGNOSIS — J0110 Acute frontal sinusitis, unspecified: Secondary | ICD-10-CM | POA: Insufficient documentation

## 2014-07-19 MED ORDER — OXYCODONE-ACETAMINOPHEN 5-325 MG PO TABS
1.0000 | ORAL_TABLET | Freq: Once | ORAL | Status: AC
  Administered 2014-07-19: 1 via ORAL
  Filled 2014-07-19: qty 1

## 2014-07-19 MED ORDER — DOXYCYCLINE HYCLATE 100 MG PO CAPS: 100.0000 mg | ORAL_CAPSULE | Freq: Two times a day (BID) | ORAL | Status: DC

## 2014-07-19 MED ORDER — NAPROXEN 375 MG PO TABS: 375.0000 mg | ORAL_TABLET | Freq: Two times a day (BID) | ORAL | Status: DC

## 2014-07-19 NOTE — ED Provider Notes (Signed)
[name]: [id number]     Arrival date & time 07/19/14  1121 History   First MD Initiated Contact with Patient 07/19/14 1209     Chief Complaint  Patient presents with  . Cough  . Foot Pain   HPI   49 year old with significant past medical history of diabetes, hypertension, stroke female presents today with numerous complaints. Patient reports that 2 weeks ago she started developing upper respiratory infection including rhinorrhea, cough, sore throat. Patient reports symptoms continue progress, she became short of breath with ambulation. Patient reports a baseline shortness of breath, this is increased over that. She reports that the cough became productive with "yellow sputum". Patient's concern is pneumonia she has a previous history of this. Patient also complains of right foot pain and swelling of foot distal extremity. Patient reports over the last 2-3 days she's had progressive forefoot pain worse with ambulation with associated foot swelling. She reports her last day her right distal extremity has began to swell up to her knee, and is unable to ambulate without assistance. Patient denies any significant past injuries to the foot or leg, does reports she's had similar presentations like this. She reports one year ago she was seen by an orthopedic specialist with no significant findings. Patient reports baseline peripheral edema for which she takes hydrochlorothiazide. She reports occasionally her right extremity swells, the swelling has increased over her baseline. Patient denies a history of DVT, PE, prolonged immobilization, recent surgery, estrogen use. Patient reports that she has episodic chest pain, for which she's had significant workup previously. Patient reports she has nitroglycerin at home as needed for this, reports early in the week she had one episode for which she needed nitroglycerin. Patient denies chest pain today.  Past Medical History  Diagnosis Date  . Diabetes mellitus   .  Hypertension   . Stroke 02/2013   Past Surgical History  Procedure Laterality Date  . No past surgeries    . Cataract extraction  01/2014   Family History  Problem Relation Age of Onset  . Diabetes Mother   . Diabetes Father   . Heart disease Father   . Diabetes Sister   . Diabetes Brother    History  Substance Use Topics  . Smoking status: Never Smoker   . Smokeless tobacco: Never Used  . Alcohol Use: No   OB History    No data available     Review of Systems  All other systems reviewed and are negative.   Allergies  Penicillins; Valium; Lisinopril; and Food  Home Medications   Prior to Admission medications   Medication Sig Start Date End Date Taking? Authorizing Provider  albuterol (PROVENTIL HFA;VENTOLIN HFA) 108 (90 BASE) MCG/ACT inhaler Inhale 2 puffs into the lungs every 6 (six) hours as needed for wheezing or shortness of breath.   Yes Historical Provider, MD  amLODipine (NORVASC) 5 MG tablet Take 1 tablet (5 mg total) by mouth daily. 09/27/13  Yes Radhika P Phadke, MD  hydrochlorothiazide (HYDRODIURIL) 25 MG tablet Take 1 tablet (25 mg total) by mouth daily. 09/27/13  Yes Radhika Concha Se, MD  HYDROcodone-acetaminophen (NORCO/VICODIN) 5-325 MG per tablet Take 1-2 tablets by mouth every 4 (four) hours as needed for moderate pain or severe pain. 03/21/14  Yes Charlesetta Shanks, MD  ibuprofen (ADVIL,MOTRIN) 200 MG tablet Take 400 mg by mouth daily as needed for moderate pain.   Yes Historical Provider, MD  insulin lispro protamine-lispro (HUMALOG MIX 75/25) (75-25) 100 UNIT/ML SUSP injection Inject 75  units [place] in the morning , 50 units [place] with lunch, 75 units [place] with supper 09/28/13  Yes Radhika P Phadke, MD  metoprolol succinate (TOPROL-XL) 25 MG 24 hr tablet Take 25 mg by mouth daily.   Yes Historical Provider, MD  Multiple Vitamins-Minerals (MULTIVITAMIN WITH MINERALS) tablet Take 1 tablet by mouth daily.   Yes Historical Provider, MD  ranitidine (ZANTAC) 150 MG tablet  Take 150 mg by mouth daily.   Yes Historical Provider, MD  hydrochlorothiazide (HYDRODIURIL) 12.5 MG tablet Take 2 tablets (25 mg total) by mouth daily. Patient not taking: Reported on 03/20/2014 12/12/13   Comer Locket, PA-C  naproxen (NAPROSYN) 375 MG tablet Take 1 tablet (375 mg total) by mouth 2 (two) times daily. Patient not taking: Reported on 07/19/2014 03/21/14   Charlesetta Shanks, MD  predniSONE (DELTASONE) 20 MG tablet 3 tabs po day one, then 2 tabs daily x 4 days Patient not taking: Reported on 03/20/2014 12/12/13   Comer Locket, PA-C   BP 145/76 mmHg  Pulse 114  Temp(Src) 97.5 F (36.4 C) (Oral)  Resp 20  SpO2 99% Physical Exam  Constitutional: She is oriented to person, place, and time. She appears well-developed and well-nourished.  Morbidly obese  HENT:  Head: Normocephalic and atraumatic.  Right Ear: Hearing, tympanic membrane and external ear normal.  Left Ear: Hearing, tympanic membrane, external ear and ear canal normal.  Mouth/Throat: Oropharynx is clear and moist.  Frontal sinuses mildly tender to percussion, no signs of soft tissue infection, mucosal edema noted  Eyes: Pupils are equal, round, and reactive to light.  Neck: Normal range of motion. Neck supple. No JVD present. No tracheal deviation present. No thyromegaly present.  Cardiovascular: Normal rate, regular rhythm, normal heart sounds and intact distal pulses.  Exam reveals no gallop and no friction rub.   No murmur heard. Pulmonary/Chest: Effort normal and breath sounds normal. No stridor. No respiratory distress. She has no wheezes. She has no rales. She exhibits no tenderness.  Abdominal: Soft. There is no tenderness.  Musculoskeletal: Normal range of motion.  Bilateral lower extremity edema, right extremity slightly larger than left. Right foot painful to palpation of the dorsum, no signs of trauma, infection, abnormalities.  Lymphadenopathy:    She has no cervical adenopathy.  Neurological: She is  alert and oriented to person, place, and time. Coordination normal.  Skin: Skin is warm and dry.  Psychiatric: She has a normal mood and affect. Her behavior is normal. Judgment and thought content normal.  Nursing note and vitals reviewed.   ED Course  Procedures (including critical care time) Labs Review Labs Reviewed - No data to display  Imaging Review Dg Chest 2 View (if Patient Has Fever And/or Copd)  07/19/2014   CLINICAL DATA:  Cough and congestion ; shortness of breath  EXAM: CHEST  2 VIEW  COMPARISON:  December 12, 2013  FINDINGS: Lungs are clear. Heart size and pulmonary vascularity are normal. No adenopathy. There is degenerative change in each shoulder.  IMPRESSION: No edema or consolidation.   Electronically Signed   By: Lowella Grip III M.D.   On: 07/19/2014 12:32   Dg Foot Complete Right  07/19/2014   CLINICAL DATA:  49 year old female with increasing right foot pain and soft tissue swelling. Initial encounter.  EXAM: RIGHT FOOT COMPLETE - 3+ VIEW  COMPARISON:  03/20/2014.  FINDINGS: The calcaneus remains intact with degenerative spurring. Soft tissue swelling about the right foot appears mildly regressed since January. Widespread tarsal and TMT  joint degeneration. Bulky osteophytosis at the talonavicular joint. First MTP joint degeneration again noted. No fracture, dislocation, or osteolysis. No subcutaneous gas.  IMPRESSION: Mildly improve soft tissue swelling since January.  No acute osseous abnormality identified in the right foot.  Chronic degenerative changes, advanced at the talonavicular joint.   Electronically Signed   By: Genevie Ann M.D.   On: 07/19/2014 12:33     EKG Interpretation None      MDM   Final diagnoses:  Right foot pain  Acute frontal sinusitis, recurrence not specified    Labs: none  Imaging:DG chest / DG foot, LE Venous- DG foot shows mild improve soft tissue swelling since January  Consults: None  Therapeutics: Percocet  Assessment: Foot  pain, sinusitis  Plan: Patient presents with chronic foot pain for which she's been seen previously. Lower extremity swelling with unilateral dominance on the right. She reports that today the right is more swollen than usual. Patient has no signs of PE or DVT as well as of 1.5 with a heart rate greater than 100 ( heart rate noted to be lower); lower extremity venous showed no signs of DVT. Patient also presents with 2 weeks of sinus congestion frontal pressure, with radiation into her teeth with forward flexion; likely represents bacterial sinusitis due to persistent symptoms after 14 days. Patient will be treated with oral antibiotics, naproxen for her foot pain, and given strict return precautions the event new worsening signs or symptoms present. Patient is encouraged to follow up with orthopedic for further evaluation of foot pain, patient verbalized her understanding to today's plan and agreed to follow-up evaluation.       Okey Regal, PA-C 07/20/14 [place], DO 07/21/14 [phone]

## 2014-07-19 NOTE — Progress Notes (Signed)
VASCULAR LAB PRELIMINARY  PRELIMINARY  PRELIMINARY  PRELIMINARY  Right lower extremity venous duplex completed.    Preliminary report:  Right:  No evidence of DVT, superficial thrombosis, or Baker's cyst.  Wynette Jersey, RVS 07/19/2014, 1:42 PM

## 2014-07-19 NOTE — Discharge Instructions (Signed)
Please read attached information. Please use medication as directed, follow up with your primary care provider for further evaluation and management of sinusitis symptoms. His use saline nasal rinses. Please use the proximal foot pain. Monitor for new or worsening signs or symptoms return to ED if any present.

## 2014-07-19 NOTE — ED Notes (Addendum)
Pt reports R foot pain and swelling a few days ago. Tried rest and elevation, but then started to have leg swelling and pain got worse. Pt reports she is unable to bear weight on foot. No injuries she is aware of. Pt also reports productive cough with yellow sputum for past 2 weeks. Feeling somewhat SOB due to phlegm, but able to speak in full sentences with no difficulty. Hx of PNA

## 2014-08-06 ENCOUNTER — Emergency Department (HOSPITAL_COMMUNITY)
Admission: EM | Admit: 2014-08-06 | Discharge: 2014-08-06 | Disposition: A | Discharge status: Home / Self Care | Payer: BLUE CROSS/BLUE SHIELD | Attending: Emergency Medicine | Admitting: Emergency Medicine

## 2014-08-06 ENCOUNTER — Encounter (HOSPITAL_COMMUNITY): Payer: Self-pay | Admitting: *Deleted

## 2014-08-06 DIAGNOSIS — R1013 Epigastric pain: Secondary | ICD-10-CM | POA: Insufficient documentation

## 2014-08-06 DIAGNOSIS — I10 Essential (primary) hypertension: Secondary | ICD-10-CM | POA: Diagnosis not present

## 2014-08-06 DIAGNOSIS — R000 Tachycardia, unspecified: Secondary | ICD-10-CM | POA: Diagnosis not present

## 2014-08-06 DIAGNOSIS — R109 Unspecified abdominal pain: Secondary | ICD-10-CM | POA: Diagnosis present

## 2014-08-06 DIAGNOSIS — Z8673 Personal history of transient ischemic attack (TIA), and cerebral infarction without residual deficits: Secondary | ICD-10-CM | POA: Insufficient documentation

## 2014-08-06 DIAGNOSIS — E669 Obesity, unspecified: Secondary | ICD-10-CM | POA: Insufficient documentation

## 2014-08-06 DIAGNOSIS — Z792 Long term (current) use of antibiotics: Secondary | ICD-10-CM | POA: Diagnosis not present

## 2014-08-06 DIAGNOSIS — R112 Nausea with vomiting, unspecified: Secondary | ICD-10-CM | POA: Diagnosis not present

## 2014-08-06 DIAGNOSIS — R1032 Left lower quadrant pain: Secondary | ICD-10-CM | POA: Diagnosis not present

## 2014-08-06 DIAGNOSIS — Z79899 Other long term (current) drug therapy: Secondary | ICD-10-CM | POA: Diagnosis not present

## 2014-08-06 DIAGNOSIS — Z880 Allergy status to penicillin: Secondary | ICD-10-CM | POA: Insufficient documentation

## 2014-08-06 DIAGNOSIS — E119 Type 2 diabetes mellitus without complications: Secondary | ICD-10-CM | POA: Insufficient documentation

## 2014-08-06 LAB — BASIC METABOLIC PANEL
Anion gap: 11 (ref 5–15)
BUN: 17 mg/dL (ref 6–20)
CALCIUM: 9.4 mg/dL (ref 8.9–10.3)
CO2: 26 mmol/L (ref 22–32)
Chloride: 97 mmol/L — ABNORMAL LOW (ref 101–111)
Creatinine, Ser: 0.93 mg/dL (ref 0.44–1.00)
GFR calc Af Amer: >60 mL/min (ref >60)
GFR calc non Af Amer: >60 mL/min (ref >60)
GLUCOSE: 216 mg/dL — AB (ref 65–99)
Potassium: 3.6 mmol/L (ref 3.5–5.1)
Sodium: 134 mmol/L — ABNORMAL LOW (ref 135–145)

## 2014-08-06 LAB — CBC
HEMATOCRIT: 33.7 % — AB (ref 36.0–46.0)
HEMOGLOBIN: 10.7 g/dL — AB (ref 12.0–15.0)
MCH: 28.5 pg (ref 26.0–34.0)
MCHC: 31.8 g/dL (ref 30.0–36.0)
MCV: 89.9 fL (ref 78.0–100.0)
Platelets: 385 10*3/uL (ref 150–400)
RBC: 3.75 MIL/uL — ABNORMAL LOW (ref 3.87–5.11)
RDW: 13.5 % (ref 11.5–15.5)
WBC: 10.8 10*3/uL — AB (ref 4.0–10.5)

## 2014-08-06 LAB — BRAIN NATRIURETIC PEPTIDE: B Natriuretic Peptide: 17.9 pg/mL (ref 0.0–100.0)

## 2014-08-06 LAB — LIPASE, BLOOD: LIPASE: 81 U/L — AB (ref 22–51)

## 2014-08-06 MED ORDER — ONDANSETRON HCL 4 MG/2ML IJ SOLN: 4.0000 mg | Freq: Once | INTRAMUSCULAR | Status: DC

## 2014-08-06 MED ORDER — MORPHINE SULFATE 4 MG/ML IJ SOLN
6.0000 mg | Freq: Once | INTRAMUSCULAR | Status: AC
  Administered 2014-08-06: 6 mg via INTRAVENOUS
  Filled 2014-08-06: qty 2

## 2014-08-06 MED ORDER — SODIUM CHLORIDE 0.9 % IV BOLUS (SEPSIS)
1000.0000 mL | Freq: Once | INTRAVENOUS | Status: AC
  Administered 2014-08-06: 1000 mL via INTRAVENOUS

## 2014-08-06 NOTE — Discharge Instructions (Signed)
Please continue taking the antibiotic and medication that was previously prescribed to you during yesterday's visit. Stay hydrated, eat bland food and follow-up with your doctor for further care. Return to the ER if your condition worsen or if you have any other concern.

## 2014-08-06 NOTE — ED Notes (Signed)
EMS contacted d/t patient c/o of abdominal pain 8/10 that radiates up into mid chest. Patient has had Zofran 4 mg IV and 20 cc IVF. Patient reports pain began last night accompanied with N/V, dizziness. Patient stated that she was seen at [facility] ED and prescribed bactrim without unknown cause. Patient stated that she was feeling better but pain  Returned this am. Hx DM, patient feels maybe food poisoning

## 2014-08-06 NOTE — ED Provider Notes (Signed)
[name]: [id number]     Arrival date & time 08/06/14  1249 History   First MD Initiated Contact with Patient 08/06/14 1312     Chief Complaint  Patient presents with  . Abdominal Pain     (Consider location/radiation/quality/duration/timing/severity/associated sxs/prior Treatment) HPI   49 year old female with history of insulin-dependent diabetes, hypertension, prior stroke who was brought here via EMS for evaluation of abdominal pain. Patient report the pain started yesterday after she ate a hamburger that she made for her and her husband. Since then she has had persistent sharp throbbing pain to her low abdomen, with associate dizziness, nausea, bloody non-bilious contents pain is been persistent. Patient was seen at an outside ER for this evaluation. She reported abdominal CT was obtained and was unremarkable. She was subsequently diagnosed with having a urinary tract infection and was discharged with Bactrim. Patient states she did received pain medication while in the ED and it has helped with his symptoms however today her pain Much worse after she eats or drinks. She felt that this may be food poisoning. Her husband who ate the same food did not have similar complaint. She denies any recent travel or eating other exotic food. Otherwise patient denies fever, chills, headache, productive cough, hemoptysis, shortness of breath.  Past Medical History  Diagnosis Date  . Diabetes mellitus   . Hypertension   . Stroke 02/2013   Past Surgical History  Procedure Laterality Date  . No past surgeries    . Cataract extraction  01/2014   Family History  Problem Relation Age of Onset  . Diabetes Mother   . Diabetes Father   . Heart disease Father   . Diabetes Sister   . Diabetes Brother    History  Substance Use Topics  . Smoking status: Never Smoker   . Smokeless tobacco: Never Used  . Alcohol Use: No   OB History    No data available     Review of Systems  All other systems  reviewed and are negative.     Allergies  Penicillins; Valium; Lisinopril; and Food  Home Medications   Prior to Admission medications   Medication Sig Start Date End Date Taking? Authorizing Provider  albuterol (PROVENTIL HFA;VENTOLIN HFA) 108 (90 BASE) MCG/ACT inhaler Inhale 2 puffs into the lungs every 6 (six) hours as needed for wheezing or shortness of breath.    Historical Provider, MD  amLODipine (NORVASC) 5 MG tablet Take 1 tablet (5 mg total) by mouth daily. 09/27/13   Haydee Monica, MD  doxycycline (VIBRAMYCIN) 100 MG capsule Take 1 capsule (100 mg total) by mouth 2 (two) times daily. 07/19/14   Okey Regal, PA-C  hydrochlorothiazide (HYDRODIURIL) 12.5 MG tablet Take 2 tablets (25 mg total) by mouth daily. Patient not taking: Reported on 03/20/2014 12/12/13   Comer Locket, PA-C  hydrochlorothiazide (HYDRODIURIL) 25 MG tablet Take 1 tablet (25 mg total) by mouth daily. 09/27/13   Haydee Monica, MD  HYDROcodone-acetaminophen (NORCO/VICODIN) 5-325 MG per tablet Take 1-2 tablets by mouth every 4 (four) hours as needed for moderate pain or severe pain. 03/21/14   Charlesetta Shanks, MD  ibuprofen (ADVIL,MOTRIN) 200 MG tablet Take 400 mg by mouth daily as needed for moderate pain.    Historical Provider, MD  insulin lispro protamine-lispro (HUMALOG MIX 75/25) (75-25) 100 UNIT/ML SUSP injection Inject 75 units [place] in the morning , 50 units [place] with lunch, 75 units [place] with supper 09/28/13   Haydee Monica, MD  metoprolol succinate (  TOPROL-XL) 25 MG 24 hr tablet Take 25 mg by mouth daily.    Historical Provider, MD  Multiple Vitamins-Minerals (MULTIVITAMIN WITH MINERALS) tablet Take 1 tablet by mouth daily.    Historical Provider, MD  naproxen (NAPROSYN) 375 MG tablet Take 1 tablet (375 mg total) by mouth 2 (two) times daily. 07/19/14   Okey Regal, PA-C  predniSONE (DELTASONE) 20 MG tablet 3 tabs po day one, then 2 tabs daily x 4 days Patient not taking: Reported on 03/20/2014 12/12/13    Comer Locket, PA-C  ranitidine (ZANTAC) 150 MG tablet Take 150 mg by mouth daily.    Historical Provider, MD   BP 133/78 mmHg  Pulse 100  Temp(Src) 97.9 F (36.6 C) (Oral)  Resp 18  SpO2 100% Physical Exam  Constitutional: She appears well-developed and well-nourished. No distress.  Moderately obese African-American female, nontoxic in appearance  HENT:  Head: Atraumatic.  Eyes: Conjunctivae are normal.  Neck: Neck supple.  Cardiovascular: Exam reveals no gallop and no friction rub.   No murmur heard. Mild tachycardia  Pulmonary/Chest: Effort normal and breath sounds normal.  Abdominal: Soft. Bowel sounds are normal. She exhibits no distension. There is tenderness (mild suprapubic and left lower quadrant tenderness. Mild epigastric tenderness).  Neurological: She is alert.  Skin: No rash noted.  Psychiatric: She has a normal mood and affect.  Nursing note and vitals reviewed.   ED Course  Procedures (including critical care time)  Patient here with abdominal pain after eating a hamburger yesterday. She has nausea or vomiting and abdominal discomfort likely suggestive of lower GI. She has had a abdominal and pelvis CT scan yesterday that was unremarkable. I do not think she will need further imaging at this time. Workup initiated, IV fluid, antinausea medication, and pain medication given.  3:14 PM Labs mostly reassuring. A mild bump in her lipase. Patient did not tolerates morphine as it makes her dizzy. Otherwise I do not find any significant concern warranted admission at this time. She is able to tolerates by mouth. I recommend patient to follow-up with her PCP and continue treatment previously prescribed.  Return precaution given.   Labs Review Labs Reviewed  CBC - Abnormal; Notable for the following:    WBC 10.8 (*)    RBC 3.75 (*)    Hemoglobin 10.7 (*)    HCT 33.7 (*)    All other components within normal limits  BASIC METABOLIC PANEL - Abnormal; Notable for the  following:    Sodium 134 (*)    Chloride 97 (*)    Glucose, Bld 216 (*)    All other components within normal limits  LIPASE, BLOOD - Abnormal; Notable for the following:    Lipase 81 (*)    All other components within normal limits  BRAIN NATRIURETIC PEPTIDE  I-STAT TROPOININ, ED    Imaging Review No results found.   CT Abdomen Pelvis W Contrast5/31/2016  [facility]  Result Impression  IMPRESSION: Chronic abnormalities detailed above. No actionable acute abnormality.   Result Narrative  Exam date/time: 08/06/2014 12:16 AM  INDICATION: Abdominal Pain iv contrast only Yes   TECHNIQUE:  CT ABDOMEN PELVIS W CONTRAST 80 mL of Isovue-370 was administered intravenously.  FINDINGS:  Hepatic steatosis. Otherwise, the liver, the gallbladder, pancreas, spleen, and adrenals are unremarkable.  NO acute abnormality of the GI tract (no obstruction, colitis/enteritis etc.). NO pneumatosis, pneumoperitoneum, or hemoperitoneum.  NO evidence of appendicitis; appendix not seen, no inflammatory changes in the RLQ.  NO acute abnormality  of the kidneys (no mass, hydronephrosis etc.).  NO abnormality of the urinary bladder. 5.1 cm mass along the left uterine fundus is stable since prior CT, likely a pedunculated or subserosal myoma.  NO further acute abnormality in the delayed phase images of the abdomen.  NO acute osseous lesion.      EKG Interpretation None      MDM   Final diagnoses:  Abdominal pain, acute  Non-intractable vomiting with nausea, vomiting of unspecified type   BP 135/82 mmHg  Pulse 86  Temp(Src) 97.9 F (36.6 C) (Oral)  Resp 15  SpO2 100%  I have reviewed nursing notes and vital signs. I personally reviewed the imaging tests through PACS system  I reviewed available ER/hospitalization records thought the EMR     Domenic Moras, PA-C 08/06/14 [place], MD 08/06/14 [phone]

## 2014-08-06 NOTE — ED Notes (Signed)
Bed: WA20 Expected date:  Expected time:  Means of arrival:  Comments: EMS- abdominal pain

## 2015-01-19 IMAGING — CT CT ANGIO CHEST
1 of 2 series · 19 of 32 positions shown · IV contrast (OMNIPAQUE 350)
Comparison: CTA of the chest performed 09/22/2011, and chest
radiograph performed 09/11/2011

CLINICAL DATA: [name] for 2 days.

CT ANGIOGRAPHY CHEST
TECHNIQUE: Multidetector CT imaging of the chest using the
standard protocol during bolus administration of intravenous
contrast. Multiplanar reconstructed images including MIPs were
obtained and reviewed to evaluate the vascular anatomy.
Contrast: 100mL OMNIPAQUE IOHEXOL 350 MG/ML SOLN

[Series 10: thins for pacs · axial · 0.69mm/px · z∈[+50,+266]mm · 19 of 243 slices shown]
[im 13/243  lung]
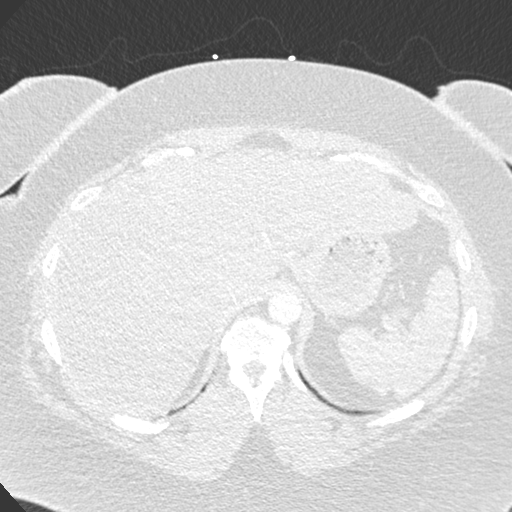
[im 25/243  mediastinal]
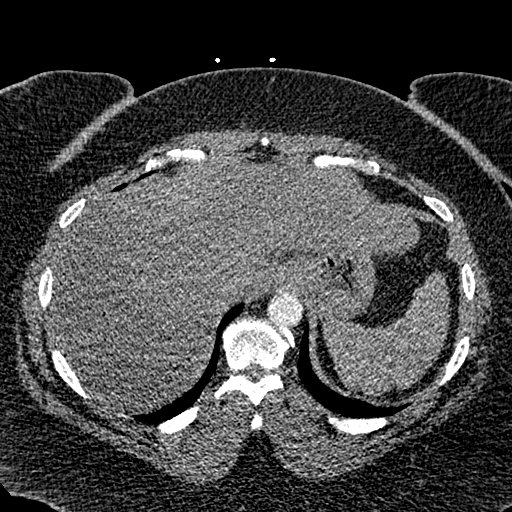
[im 37/243  lung]
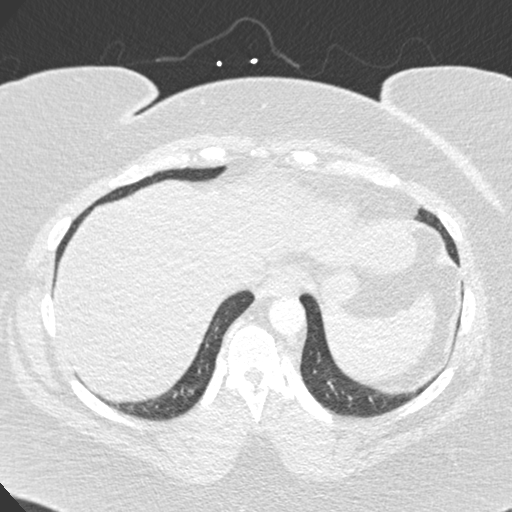
[im 61/243  mediastinal]
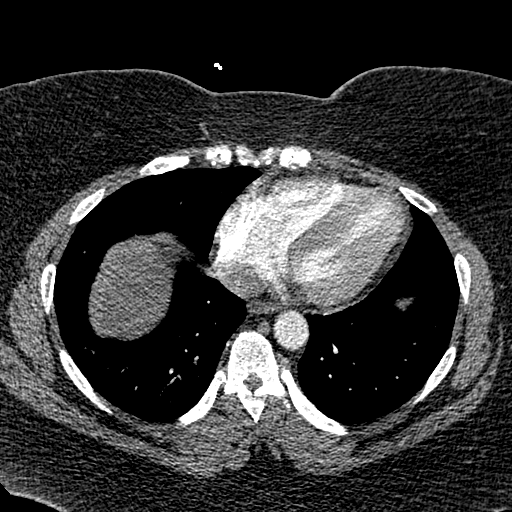
[im 73/243  lung]
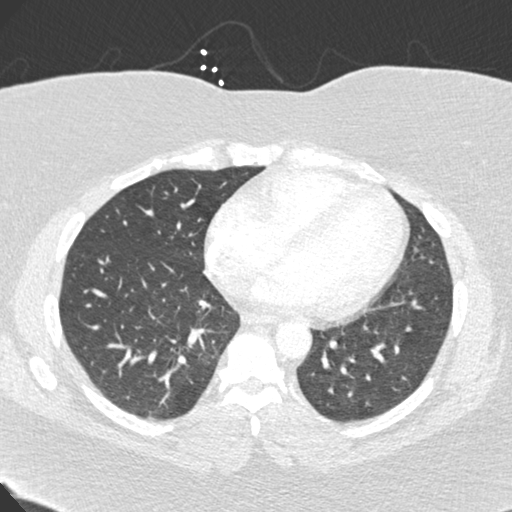
[im 81/243  mediastinal]
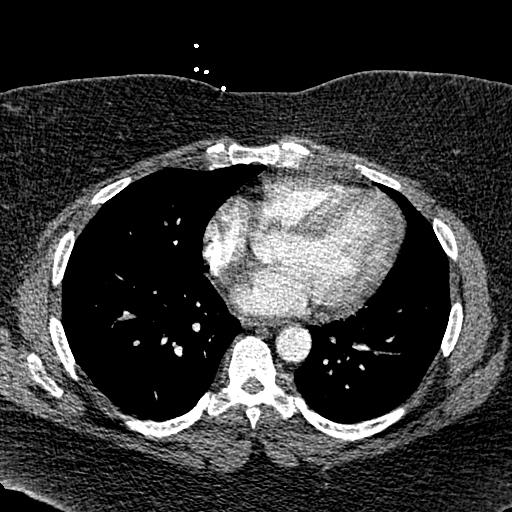
[im 85/243  lung]
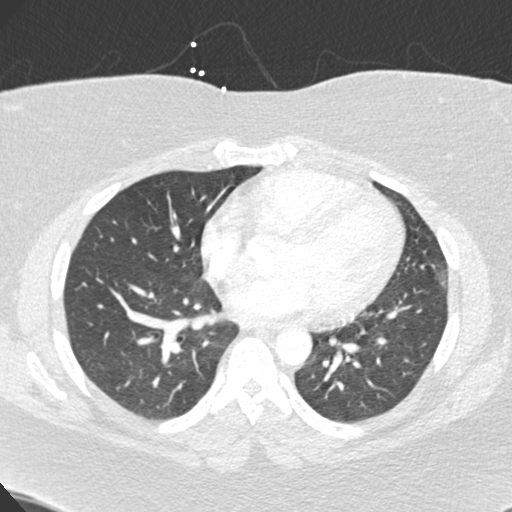
[im 97/243  mediastinal]
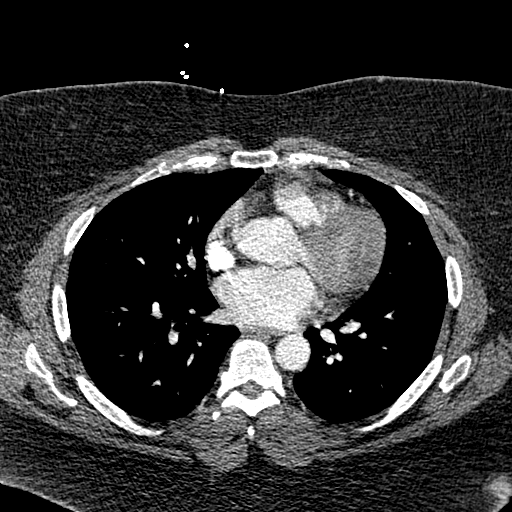
[im 109/243  lung]
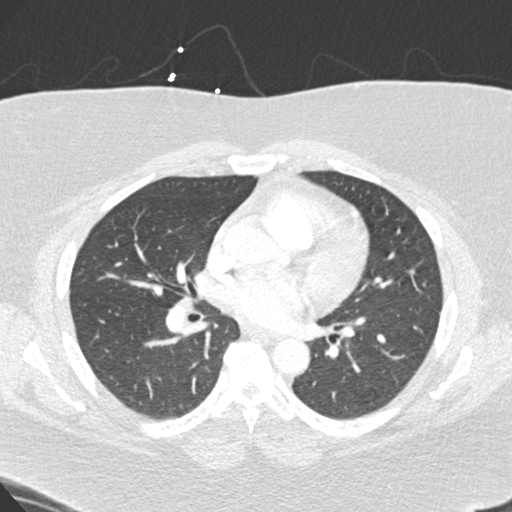
[im 122/243  mediastinal]
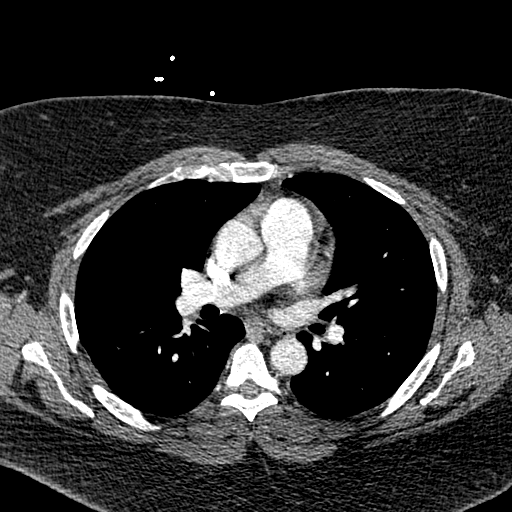
[im 134/243  lung]
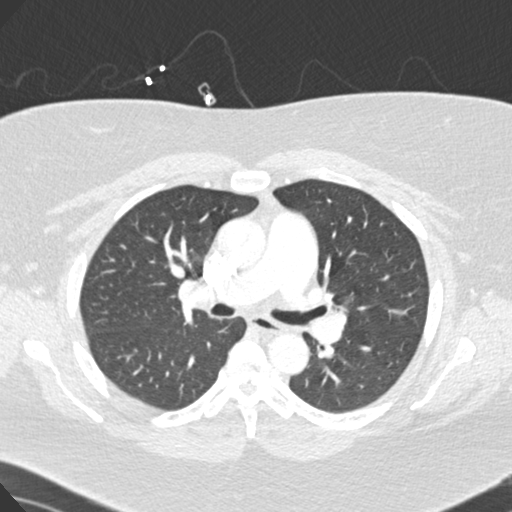
[im 146/243  mediastinal]
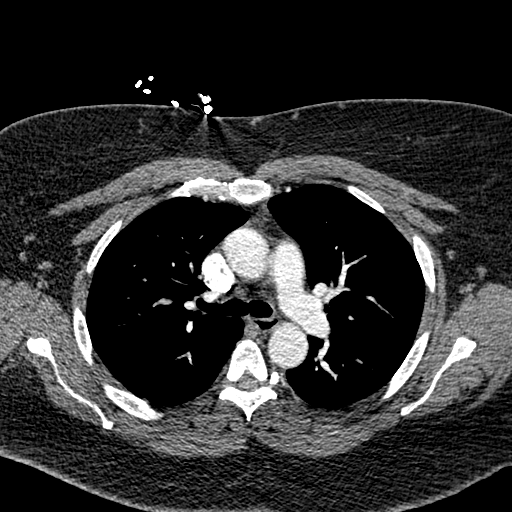
[im 158/243  lung]
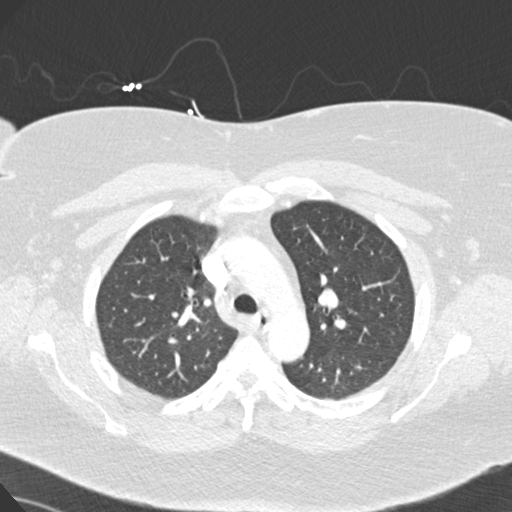
[im 162/243  mediastinal]
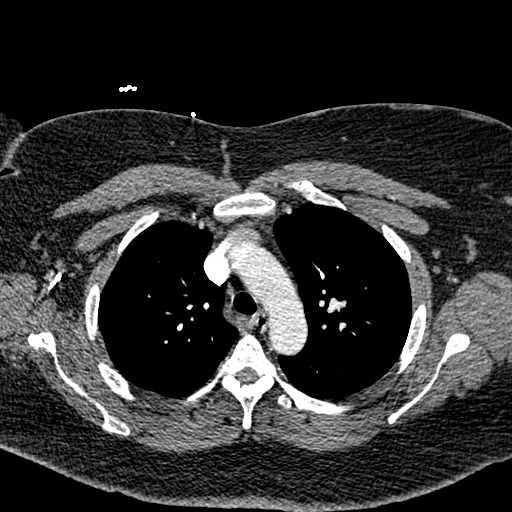
[im 170/243  lung]
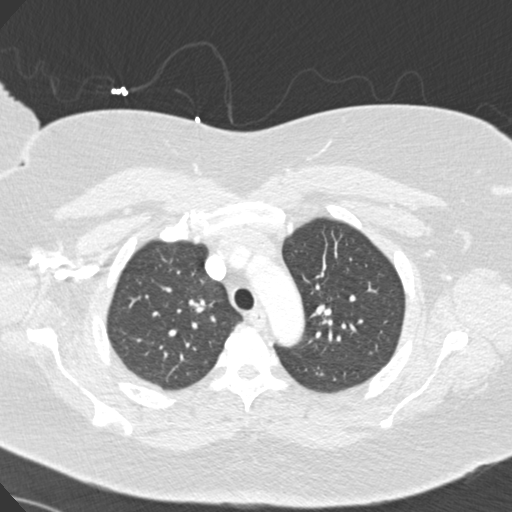
[im 182/243  mediastinal]
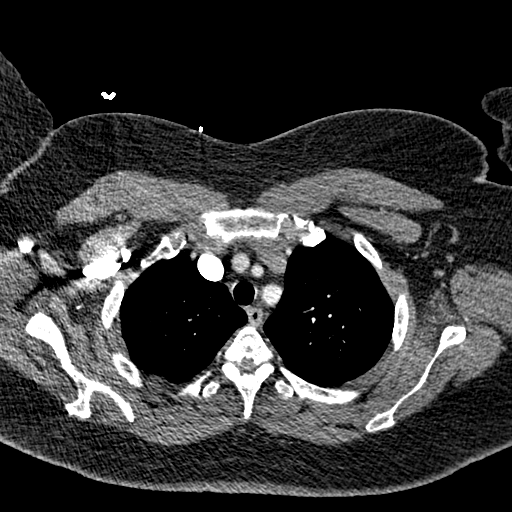
[im 206/243  lung]
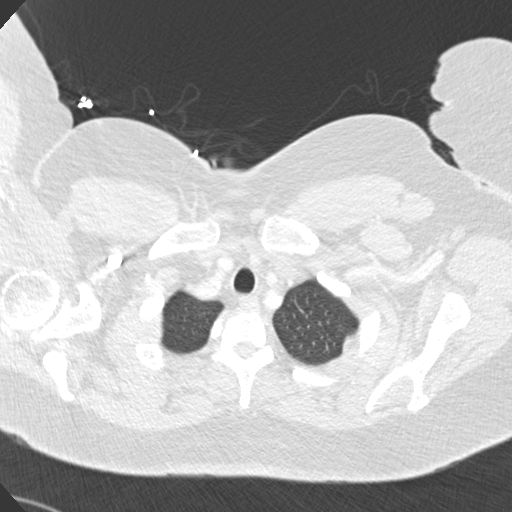
[im 218/243  mediastinal]
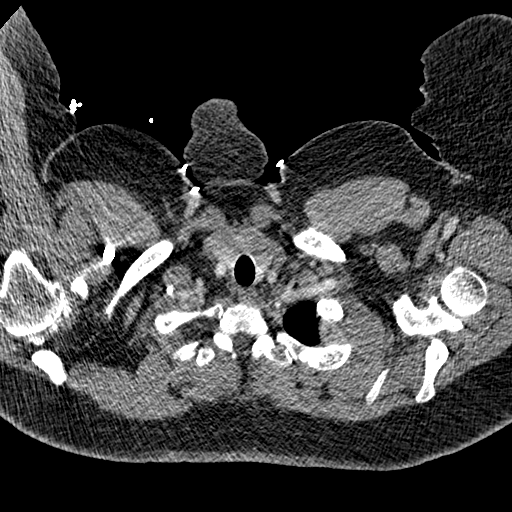
[im 230/243  lung]
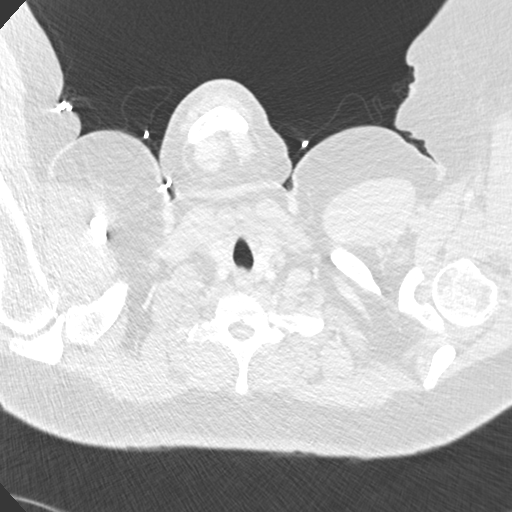

[19 of 32 positions shown; findings below may reference images not displayed]

FINDINGS: There is no evidence of pulmonary embolus.

The lungs are clear bilaterally.  There is no evidence of
significant focal consolidation, pleural effusion or pneumothorax.
No masses are identified; no abnormal focal contrast enhancement is
seen.

The mediastinum is unremarkable in appearance.  No mediastinal
lymphadenopathy is seen.  No pericardial effusion is identified.
The great vessels are grossly unremarkable in appearance.  No
axillary lymphadenopathy is seen.  The visualized portions of the
thyroid gland are unremarkable in appearance.

The visualized portions of the liver and spleen are unremarkable.

No acute osseous abnormalities are seen.
IMPRESSION: No evidence of pulmonary embolus.  Unremarkable CTA of the chest.

## 2015-01-20 IMAGING — CR DG ANKLE COMPLETE 3+V*L*
3 series · 3 of 3 positions shown · non-contrast
Comparison: Concurrently obtained radiographs of the left foot

CLINICAL DATA: Left foot and ankle swelling, no known injury

LEFT ANKLE COMPLETE - 3+ VIEW

[x ankle ap left]
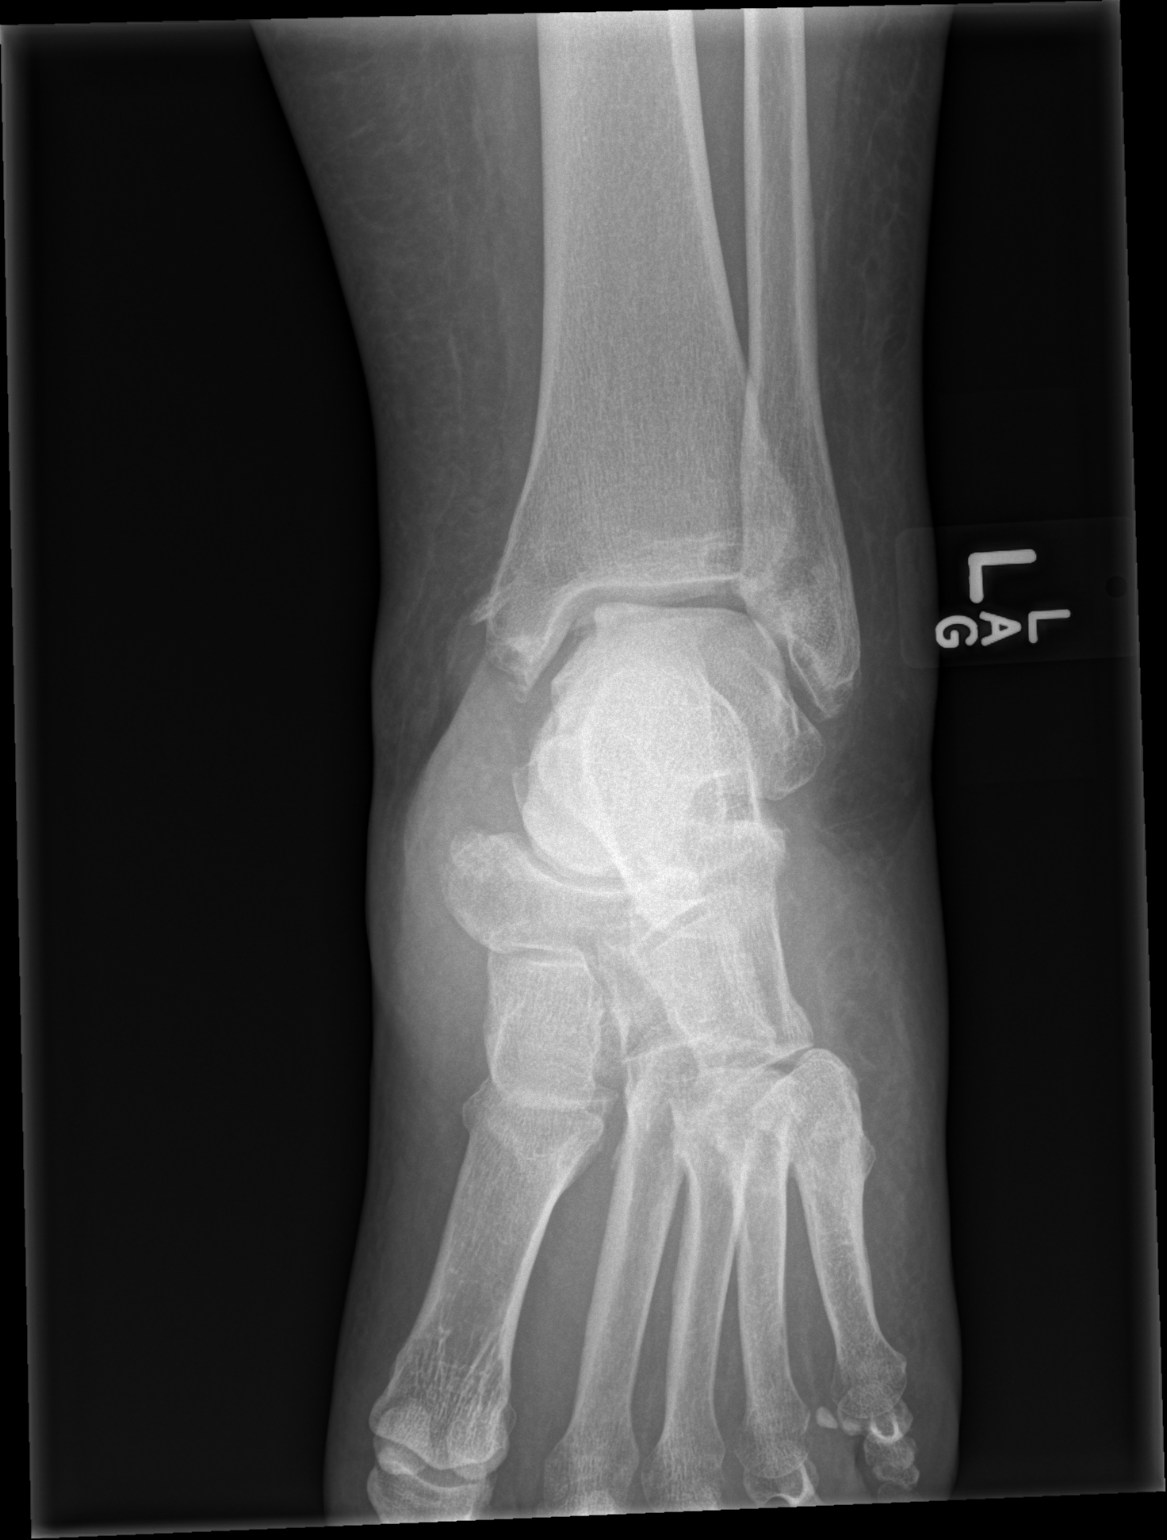

[x ankle obl left]
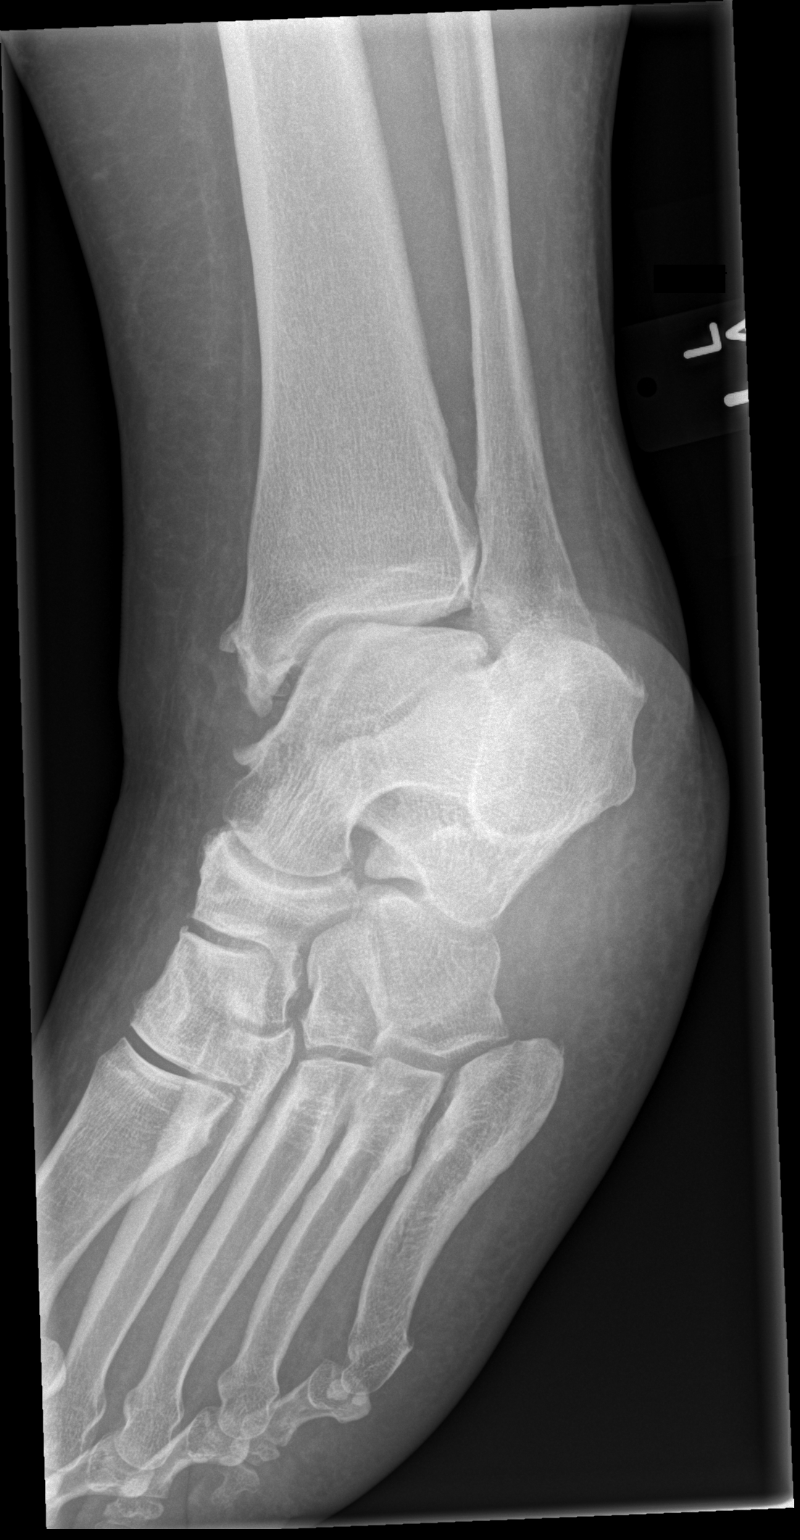

[x ankle lat left]
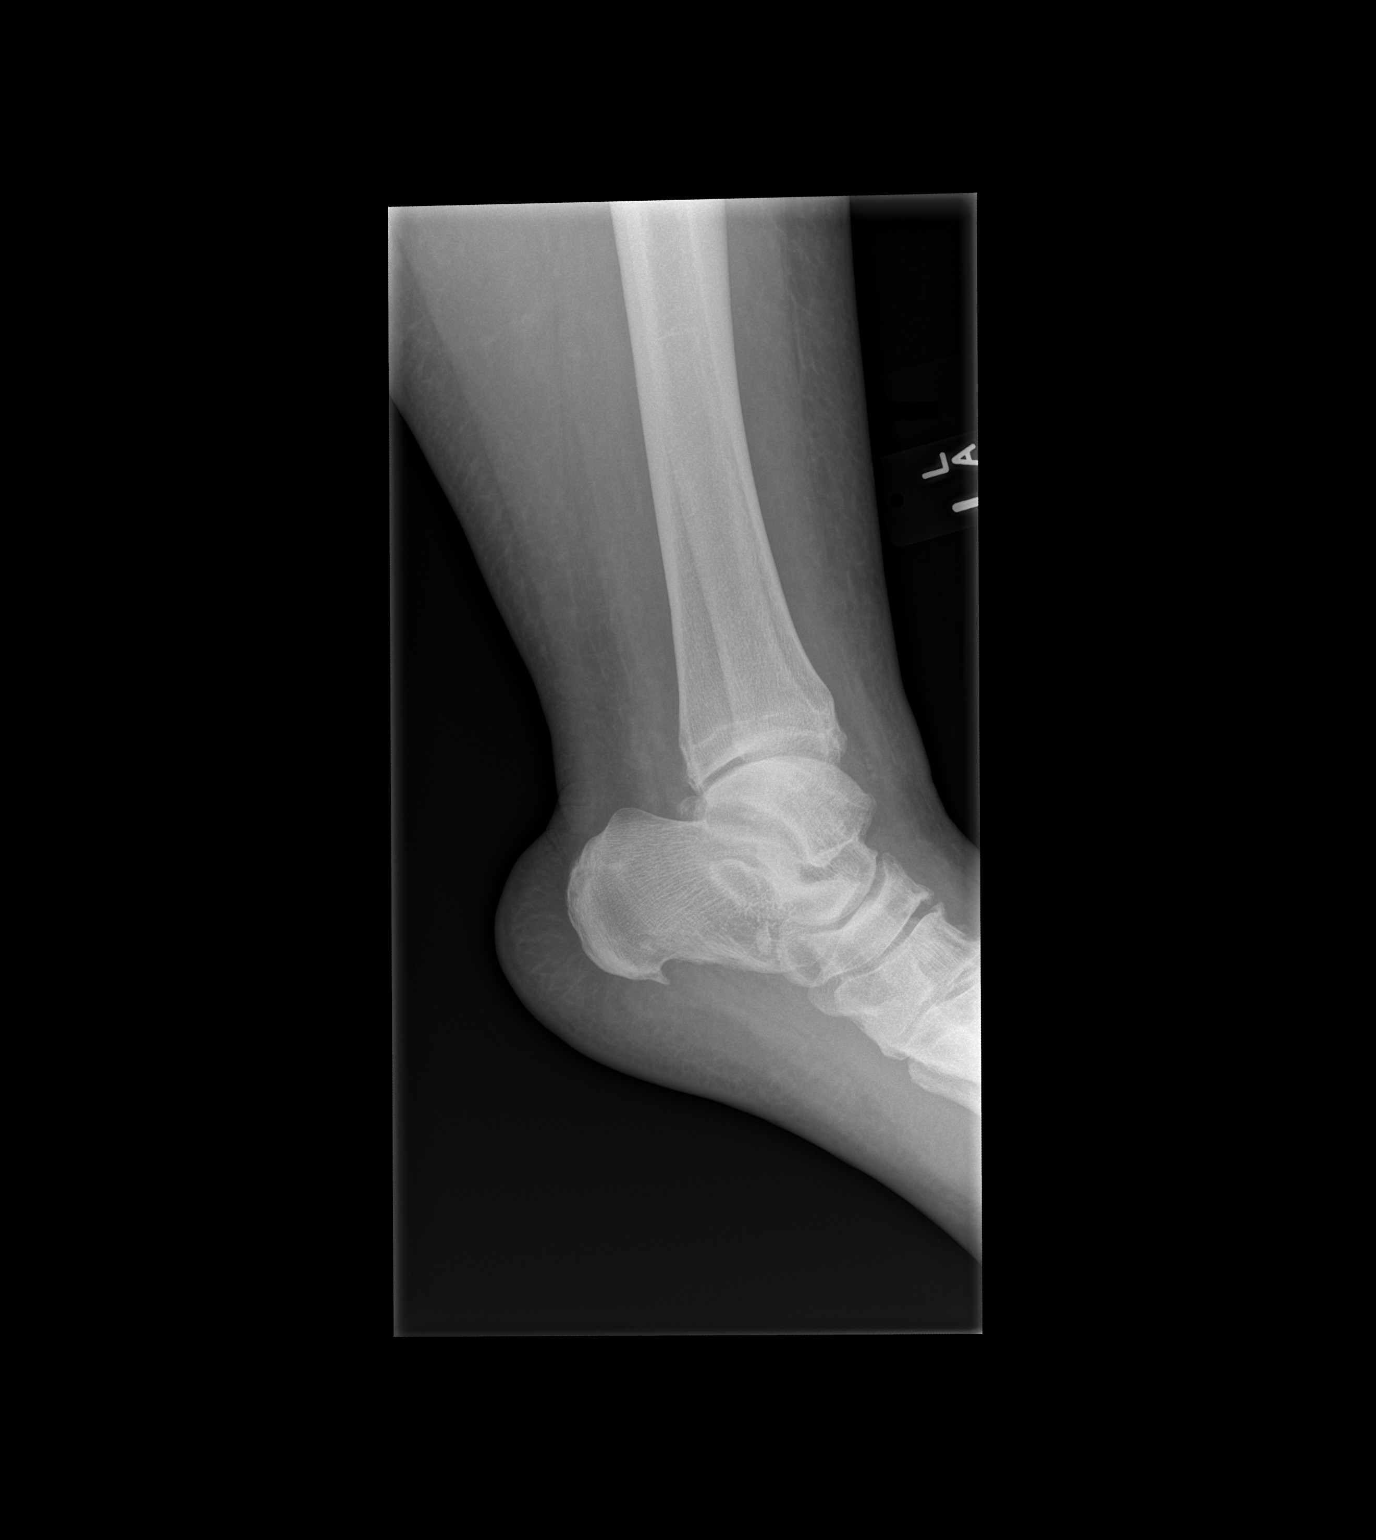

[3 of 3 positions shown; findings below may reference images not displayed]

FINDINGS: Diffuse soft tissue swelling about the ankle and foot.
Degenerative changes about the medial malleolus and the talar neck
suggest a secondary arthritis related to remote prior trauma.  Bony
mineralization is within normal limits.  No acute fracture or
malalignment.  Plantar calcaneal spurring.  Mild midfoot
osteoarthritis.
IMPRESSION: 1.  Diffuse soft tissue swelling may be related to the edema, or
cellulitis.
2.  No acute osseous abnormality.
3.  Secondary degenerative changes about the medial malleolus from
prior remote trauma.
4. Mild midfoot osteoarthritis.

## 2015-02-20 ENCOUNTER — Emergency Department (HOSPITAL_COMMUNITY)
Admission: EM | Admit: 2015-02-20 | Discharge: 2015-02-20 | Disposition: A | Discharge status: Home / Self Care | Payer: BLUE CROSS/BLUE SHIELD | Attending: Emergency Medicine | Admitting: Emergency Medicine

## 2015-02-20 ENCOUNTER — Emergency Department (HOSPITAL_COMMUNITY): Payer: BLUE CROSS/BLUE SHIELD

## 2015-02-20 DIAGNOSIS — R079 Chest pain, unspecified: Secondary | ICD-10-CM | POA: Diagnosis not present

## 2015-02-20 DIAGNOSIS — Z791 Long term (current) use of non-steroidal anti-inflammatories (NSAID): Secondary | ICD-10-CM | POA: Diagnosis not present

## 2015-02-20 DIAGNOSIS — Z3202 Encounter for pregnancy test, result negative: Secondary | ICD-10-CM | POA: Insufficient documentation

## 2015-02-20 DIAGNOSIS — Z880 Allergy status to penicillin: Secondary | ICD-10-CM | POA: Insufficient documentation

## 2015-02-20 DIAGNOSIS — I10 Essential (primary) hypertension: Secondary | ICD-10-CM | POA: Insufficient documentation

## 2015-02-20 DIAGNOSIS — R1013 Epigastric pain: Secondary | ICD-10-CM | POA: Diagnosis present

## 2015-02-20 DIAGNOSIS — E119 Type 2 diabetes mellitus without complications: Secondary | ICD-10-CM | POA: Insufficient documentation

## 2015-02-20 DIAGNOSIS — Z8673 Personal history of transient ischemic attack (TIA), and cerebral infarction without residual deficits: Secondary | ICD-10-CM | POA: Insufficient documentation

## 2015-02-20 DIAGNOSIS — Z79899 Other long term (current) drug therapy: Secondary | ICD-10-CM | POA: Insufficient documentation

## 2015-02-20 DIAGNOSIS — K859 Acute pancreatitis without necrosis or infection, unspecified: Secondary | ICD-10-CM | POA: Diagnosis not present

## 2015-02-20 DIAGNOSIS — Z794 Long term (current) use of insulin: Secondary | ICD-10-CM | POA: Insufficient documentation

## 2015-02-20 LAB — CBC
HCT: 34.1 % — ABNORMAL LOW (ref 36.0–46.0)
HEMOGLOBIN: 11.1 g/dL — AB (ref 12.0–15.0)
MCH: 29.5 pg (ref 26.0–34.0)
MCHC: 32.6 g/dL (ref 30.0–36.0)
MCV: 90.7 fL (ref 78.0–100.0)
PLATELETS: 306 10*3/uL (ref 150–400)
RBC: 3.76 MIL/uL — AB (ref 3.87–5.11)
RDW: 13.6 % (ref 11.5–15.5)
WBC: 8.5 10*3/uL (ref 4.0–10.5)

## 2015-02-20 LAB — COMPREHENSIVE METABOLIC PANEL
ALBUMIN: 3.4 g/dL — AB (ref 3.5–5.0)
ALK PHOS: 81 U/L (ref 38–126)
ALT: 24 U/L (ref 14–54)
AST: 19 U/L (ref 15–41)
Anion gap: 10 (ref 5–15)
BILIRUBIN TOTAL: 0.7 mg/dL (ref 0.3–1.2)
BUN: 17 mg/dL (ref 6–20)
CALCIUM: 9.3 mg/dL (ref 8.9–10.3)
CO2: 27 mmol/L (ref 22–32)
CREATININE: 0.98 mg/dL (ref 0.44–1.00)
Chloride: 101 mmol/L (ref 101–111)
GFR calc Af Amer: >60 mL/min (ref >60)
GLUCOSE: 197 mg/dL — AB (ref 65–99)
POTASSIUM: 3.7 mmol/L (ref 3.5–5.1)
Sodium: 138 mmol/L (ref 135–145)
TOTAL PROTEIN: 7.4 g/dL (ref 6.5–8.1)

## 2015-02-20 LAB — POC URINE PREG, ED: Preg Test, Ur: NEGATIVE

## 2015-02-20 LAB — URINALYSIS, ROUTINE W REFLEX MICROSCOPIC
BILIRUBIN URINE: NEGATIVE
KETONES UR: NEGATIVE mg/dL
LEUKOCYTES UA: NEGATIVE
NITRITE: NEGATIVE
PH: 5.5 (ref 5.0–8.0)
PROTEIN: 30 mg/dL — AB
Specific Gravity, Urine: 1.023 (ref 1.005–1.030)

## 2015-02-20 LAB — URINE MICROSCOPIC-ADD ON

## 2015-02-20 LAB — LIPASE, BLOOD: LIPASE: 123 U/L — AB (ref 11–51)

## 2015-02-20 LAB — I-STAT TROPONIN, ED: TROPONIN I, POC: 0 ng/mL (ref 0.00–0.08)

## 2015-02-20 LAB — CBG MONITORING, ED: Glucose-Capillary: 208 mg/dL — ABNORMAL HIGH (ref 65–99)

## 2015-02-20 MED ORDER — ONDANSETRON HCL 4 MG/2ML IJ SOLN
4.0000 mg | Freq: Once | INTRAMUSCULAR | Status: AC
  Administered 2015-02-20: 4 mg via INTRAVENOUS
  Filled 2015-02-20: qty 2

## 2015-02-20 MED ORDER — HYDROMORPHONE HCL 1 MG/ML IJ SOLN
1.0000 mg | Freq: Once | INTRAMUSCULAR | Status: AC
  Administered 2015-02-20: 1 mg via INTRAVENOUS
  Filled 2015-02-20: qty 1

## 2015-02-20 MED ORDER — IOHEXOL 300 MG/ML  SOLN
25.0000 mL | Freq: Once | INTRAMUSCULAR | Status: AC | PRN
  Administered 2015-02-20: 25 mL via ORAL

## 2015-02-20 MED ORDER — IOHEXOL 300 MG/ML  SOLN
100.0000 mL | Freq: Once | INTRAMUSCULAR | Status: AC | PRN
  Administered 2015-02-20: 100 mL via INTRAVENOUS

## 2015-02-20 MED ORDER — FAMOTIDINE IN NACL 20-0.9 MG/50ML-% IV SOLN
20.0000 mg | Freq: Once | INTRAVENOUS | Status: AC
  Administered 2015-02-20: 20 mg via INTRAVENOUS
  Filled 2015-02-20: qty 50

## 2015-02-20 MED ORDER — SODIUM CHLORIDE 0.9 % IV SOLN
Freq: Once | INTRAVENOUS | Status: AC
  Administered 2015-02-20: 14:00:00 via INTRAVENOUS

## 2015-02-20 NOTE — ED Notes (Addendum)
Per ems pt c/o RLQ pain,n/v, and sharp substernal chest pain x4 hours. Sternal pain reproducable, exacerbated with movement and bending. Pt chewed 324mg  aspirin earlier.  20 L AC, 4mg  zofran IV given. Nausea improved. Has appendix.    Pt tried to take morning medications but she vomited them up. Sinus infection and gout flare up last week. Was on zpack.   cbg 260  Hx gout, fibromalyaia.

## 2015-02-20 NOTE — ED Provider Notes (Signed)
Handoff received from Dr. Zenia Resides at 1600 with plan for follow up CT scan and disposition.   Results:  BP 115/70 mmHg  Pulse 91  Temp(Src) 97.7 F (36.5 C) (Oral)  Resp 12  Ht 5\' 6"  (1.676 m)  Wt 315 lb (142.883 kg)  BMI 50.87 kg/m2  SpO2 98%  Results for orders placed or performed during the hospital encounter of 02/20/15  CBC  Result Value Ref Range   WBC 8.5 4.0 - 10.5 K/uL   RBC 3.76 (L) 3.87 - 5.11 MIL/uL   Hemoglobin 11.1 (L) 12.0 - 15.0 g/dL   HCT 34.1 (L) 36.0 - 46.0 %   MCV 90.7 78.0 - 100.0 fL   MCH 29.5 26.0 - 34.0 pg   MCHC 32.6 30.0 - 36.0 g/dL   RDW 13.6 11.5 - 15.5 %   Platelets 306 150 - 400 K/uL  Urinalysis, Routine w reflex microscopic (not at [facility])  Result Value Ref Range   Color, Urine YELLOW YELLOW   APPearance CLEAR CLEAR   Specific Gravity, Urine 1.023 1.005 - 1.030   pH 5.5 5.0 - 8.0   Glucose, UA >1000 (A) NEGATIVE mg/dL   Hgb urine dipstick TRACE (A) NEGATIVE   Bilirubin Urine NEGATIVE NEGATIVE   Ketones, ur NEGATIVE NEGATIVE mg/dL   Protein, ur 30 (A) NEGATIVE mg/dL   Nitrite NEGATIVE NEGATIVE   Leukocytes, UA NEGATIVE NEGATIVE  Comprehensive metabolic panel  Result Value Ref Range   Sodium 138 135 - 145 mmol/L   Potassium 3.7 3.5 - 5.1 mmol/L   Chloride 101 101 - 111 mmol/L   CO2 27 22 - 32 mmol/L   Glucose, Bld 197 (H) 65 - 99 mg/dL   BUN 17 6 - 20 mg/dL   Creatinine, Ser 0.98 0.44 - 1.00 mg/dL   Calcium 9.3 8.9 - 10.3 mg/dL   Total Protein 7.4 6.5 - 8.1 g/dL   Albumin 3.4 (L) 3.5 - 5.0 g/dL   AST 19 15 - 41 U/L   ALT 24 14 - 54 U/L   Alkaline Phosphatase 81 38 - 126 U/L   Total Bilirubin 0.7 0.3 - 1.2 mg/dL   GFR calc non Af Amer >60 >60 mL/min   GFR calc Af Amer >60 >60 mL/min   Anion gap 10 5 - 15  Lipase, blood  Result Value Ref Range   Lipase 123 (H) 11 - 51 U/L  Urine microscopic-add on  Result Value Ref Range   Squamous Epithelial / LPF 0-5 (A) NONE SEEN   WBC, UA 6-30 0 - 5 WBC/hpf   RBC / HPF 0-5 0 - 5 RBC/hpf   Bacteria, UA RARE (A) NONE SEEN  POC urine preg, ED (not at [facility])  Result Value Ref Range   Preg Test, Ur NEGATIVE NEGATIVE  POC CBG, ED  Result Value Ref Range   Glucose-Capillary 208 (H) 65 - 99 mg/dL  I-stat troponin, ED (not at [facility])  Result Value Ref Range   Troponin i, poc 0.00 0.00 - 0.08 ng/mL   Comment 3            Dg Chest 2 View  02/20/2015  CLINICAL DATA:  Central chest pain for 4 hours EXAM: CHEST - 2 VIEW COMPARISON:  07/19/2014 FINDINGS: The heart size and mediastinal contours are within normal limits. Both lungs are clear. The visualized skeletal structures are unremarkable. IMPRESSION: No active disease. Electronically Signed   By: Inez Catalina M.D.   On: 02/20/2015 13:48   Ct Abdomen  Pelvis W Contrast  02/20/2015  CLINICAL DATA:  Right lower quadrant abdominal pain. EXAM: CT ABDOMEN AND PELVIS WITH CONTRAST TECHNIQUE: Multidetector CT imaging of the abdomen and pelvis was performed using the standard protocol following bolus administration of intravenous contrast. CONTRAST:  149mL OMNIPAQUE IOHEXOL 300 MG/ML  SOLN COMPARISON:  Chest CT- 12/10/2012; 09/22/2011 FINDINGS: Normal hepatic contour. There is diffuse decreased attenuation of the hepatic parenchyma suggestive hepatic steatosis. No discrete hepatic lesions. Normal appearance of the gallbladder. No radiopaque gallstones. No intra or extrahepatic biliary ductal dilatation. No ascites. There is symmetric enhancement and excretion of the bilateral kidneys. No definite renal stones. No discrete renal lesions. No urinary obstruction or perinephric stranding. Normal appearance of the bilateral adrenal glands, pancreas and spleen. Moderate colonic stool burden. No evidence of enteric obstruction. Normal appearance of the terminal ileum and appendix. No pneumoperitoneum, pneumatosis or portal venous gas. Normal caliber the abdominal aorta. The major branch vessels of the abdominal aorta appear patent on this non CTA  examination. No bulky retroperitoneal, mesenteric, pelvic or inguinal lymphadenopathy. There is an approximately 5.0 x 5.6 cm structure within the left adnexa which is incompletely evaluated though favored to represent either an exophytic fibroid versus a broad ligament fibroid. Remaining pelvic organs appear normal. No free fluid in the pelvic cul-de-sac. Normal appearance of the urinary bladder given degree of distention. Limited visualization of the lower thorax demonstrates minimal dependent subpleural ground-glass atelectasis within image right lower lobe. No acute or aggressive osseous abnormalities. Mild-to-moderate multilevel lumbar spine DDD, worse at L5-S1 with disc space height loss, endplate irregularity and sclerosis. Moderate to severe bilateral deficits facet degenerative change within the lower lumbar spine. Regional soft tissues appear normal. IMPRESSION: 1. No definite explanation for patient's persistent right lower quadrant abdominal pain. Specifically, no evidence of enteric or urinary obstruction. Normal appearance of the appendix. 2. Approximately 5.6 cm structure within the left at adnexa, incompletely evaluated though favored to represent either an exophytic fibroid versus a broad ligament fibroid. Further evaluation with pelvic ultrasound could be performed as clinically indicated. Electronically Signed   By: [name] M.D.   On: 02/20/2015 16:36    Diagnoses that have been ruled out:  None  Diagnoses that are still under consideration:  None  Final diagnoses:  Acute pancreatitis without infection or necrosis, unspecified pancreatitis type   49 year old female presents with chest and abdominal pain. Appears to have developed a mild idiopathic pancreatitis. Lipase level is not 3 times above upper limit and therefore nondiagnostic. Reevaluated and pain appears improved. Patient is not vomiting. Discussed admission versus discharge and patient wishing to go home. Incidental fibroid  noted on CT scan but no signs of complication related to the pancreas or cholecystitis, appendicitis or other surgical emergency. Recommended following bland diet and clear liquids.  Plan to follow up with PCP as needed and return precautions discussed for worsening or new concerning symptoms.    Leo Grosser, MD 02/20/15 [phone]

## 2015-02-20 NOTE — ED Notes (Signed)
Bed: [id number] Expected date:  Expected time:  Means of arrival:  Comments: Chest pain/groin pain

## 2015-02-20 NOTE — ED Notes (Signed)
Pt back from CT

## 2015-02-20 NOTE — ED Provider Notes (Signed)
[name]: [id number]     Arrival date & time 02/20/15  1254 History   First MD Initiated Contact with Patient 02/20/15 1315     Chief Complaint  Patient presents with  . Abdominal Pain  . Chest Pain     (Consider location/radiation/quality/duration/timing/severity/associated sxs/prior Treatment) HPI Comments: Patient here complaining of acute onset of epigastric abdominal pain that radiated to her chest as well as.right lower quadrant that started after she had a sausage biscuit. Describes as cramping without associated diaphoresis or dyspnea. Denies any recent illnesses such as fever or cough. Was at her baseline state of health until she ate the food. She also experienced an episode of vomiting 2 along with some nausea. His symptoms resolved. Denies any diarrhea. Symptoms have been gradually improving. She called EMS and was given aspirin as well as Zofran. Was recently treated for sinus infection  Patient is a 49 y.o. female presenting with abdominal pain and chest pain. The history is provided by the patient.  Abdominal Pain Associated symptoms: chest pain   Chest Pain Associated symptoms: abdominal pain     Past Medical History  Diagnosis Date  . Diabetes mellitus   . Hypertension   . Stroke 02/2013   Past Surgical History  Procedure Laterality Date  . No past surgeries    . Cataract extraction  01/2014   Family History  Problem Relation Age of Onset  . Diabetes Mother   . Diabetes Father   . Heart disease Father   . Diabetes Sister   . Diabetes Brother    Social History  Substance Use Topics  . Smoking status: Never Smoker   . Smokeless tobacco: Never Used  . Alcohol Use: No   OB History    No data available     Review of Systems  Cardiovascular: Positive for chest pain.  Gastrointestinal: Positive for abdominal pain.  All other systems reviewed and are negative.     Allergies  Penicillins; Valium; Lisinopril; and Food  Home Medications   Prior to  Admission medications   Medication Sig Start Date End Date Taking? Authorizing Provider  albuterol (PROVENTIL HFA;VENTOLIN HFA) 108 (90 BASE) MCG/ACT inhaler Inhale 2 puffs into the lungs every 6 (six) hours as needed for wheezing or shortness of breath.    Historical Provider, MD  amLODipine (NORVASC) 5 MG tablet Take 1 tablet (5 mg total) by mouth daily. 09/27/13   Haydee Monica, MD  doxycycline (VIBRAMYCIN) 100 MG capsule Take 1 capsule (100 mg total) by mouth 2 (two) times daily. 07/19/14   Okey Regal, PA-C  hydrochlorothiazide (HYDRODIURIL) 12.5 MG tablet Take 2 tablets (25 mg total) by mouth daily. Patient not taking: Reported on 03/20/2014 12/12/13   Comer Locket, PA-C  hydrochlorothiazide (HYDRODIURIL) 25 MG tablet Take 1 tablet (25 mg total) by mouth daily. 09/27/13   Haydee Monica, MD  HYDROcodone-acetaminophen (NORCO/VICODIN) 5-325 MG per tablet Take 1-2 tablets by mouth every 4 (four) hours as needed for moderate pain or severe pain. Patient not taking: Reported on 08/06/2014 03/21/14   Charlesetta Shanks, MD  ibuprofen (ADVIL,MOTRIN) 200 MG tablet Take 400 mg by mouth daily as needed for moderate pain.    Historical Provider, MD  insulin lispro protamine-lispro (HUMALOG MIX 75/25) (75-25) 100 UNIT/ML SUSP injection Inject 75 units [place] in the morning , 50 units [place] with lunch, 75 units [place] with supper 09/28/13   Radhika P Phadke, MD  metoprolol succinate (TOPROL-XL) 25 MG 24 hr tablet Take 25 mg by mouth  daily.    Historical Provider, MD  Multiple Vitamins-Minerals (MULTIVITAMIN WITH MINERALS) tablet Take 1 tablet by mouth daily.    Historical Provider, MD  naproxen (NAPROSYN) 375 MG tablet Take 1 tablet (375 mg total) by mouth 2 (two) times daily. 07/19/14   Okey Regal, PA-C  predniSONE (DELTASONE) 20 MG tablet 3 tabs po day one, then 2 tabs daily x 4 days Patient not taking: Reported on 03/20/2014 12/12/13   Comer Locket, PA-C  ranitidine (ZANTAC) 150 MG tablet Take 150 mg by mouth  daily.    Historical Provider, MD   SpO2 100% Physical Exam  Constitutional: She is oriented to person, place, and time. She appears well-developed and well-nourished.  Non-toxic appearance. No distress.  HENT:  Head: Normocephalic and atraumatic.  Eyes: Conjunctivae, EOM and lids are normal. Pupils are equal, round, and reactive to light.  Neck: Normal range of motion. Neck supple. No tracheal deviation present. No thyroid mass present.  Cardiovascular: Normal rate, regular rhythm and normal heart sounds.  Exam reveals no gallop.   No murmur heard. Pulmonary/Chest: Effort normal and breath sounds normal. No stridor. No respiratory distress. She has no decreased breath sounds. She has no wheezes. She has no rhonchi. She has no rales.  Abdominal: Soft. Normal appearance and bowel sounds are normal. She exhibits no distension. There is no tenderness. There is no rebound and no CVA tenderness.  Musculoskeletal: Normal range of motion. She exhibits no edema or tenderness.  Neurological: She is alert and oriented to person, place, and time. She has normal strength. No cranial nerve deficit or sensory deficit. GCS eye subscore is 4. GCS verbal subscore is 5. GCS motor subscore is 6.  Skin: Skin is warm and dry. No abrasion and no rash noted.  Psychiatric: She has a normal mood and affect. Her speech is normal and behavior is normal.  Nursing note and vitals reviewed.   ED Course  Procedures (including critical care time) Labs Review Labs Reviewed  CBG MONITORING, ED - Abnormal; Notable for the following:    Glucose-Capillary 208 (*)    All other components within normal limits  LIPASE, BLOOD  COMPREHENSIVE METABOLIC PANEL  CBC  URINALYSIS, ROUTINE W REFLEX MICROSCOPIC (NOT AT [facility])  I-STAT TROPOININ, ED  POC URINE PREG, ED    Imaging Review No results found. I have personally reviewed and evaluated these images and lab results as part of my medical decision-making.   EKG  Interpretation   Date/Time:  Thursday February 20 2015 13:14:11 EST Ventricular Rate:  96 PR Interval:  174 QRS Duration: 78 QT Interval:  357 QTC Calculation: 451 R Axis:   -50 Text Interpretation:  Sinus rhythm Left anterior fascicular block RSR' in  V1 or V2, probably normal variant Borderline T abnormalities, anterior  leads Borderline ST elevation, lateral leads Confirmed by Blane Worthington  MD,  Lynessa Almanzar ([id number]) on 02/20/2015 1:28:26 PM      MDM   Final diagnoses:  None   Pt given pain meds and iv fluids, care signed out to on-coming provider     Lacretia Leigh, MD 02/24/15 2312

## 2015-02-20 NOTE — ED Notes (Signed)
Pt to xray

## 2015-02-20 NOTE — Discharge Instructions (Signed)
Low-Fat Diet for Pancreatitis or Gallbladder Conditions A low-fat diet can be helpful if you have pancreatitis or a gallbladder condition. With these conditions, your pancreas and gallbladder have trouble digesting fats. A healthy eating plan with less fat will help rest your pancreas and gallbladder and reduce your symptoms. WHAT DO I NEED TO KNOW ABOUT THIS DIET?  Eat a low-fat diet.  Reduce your fat intake to less than 20-30% of your total daily calories. This is less than 50-60 g of fat per day.  Remember that you need some fat in your diet. Ask your dietician what your daily goal should be.  Choose nonfat and low-fat healthy foods. Look for the words "nonfat," "low fat," or "fat free."  As a guide, look on the label and choose foods with less than 3 g of fat per serving. Eat only one serving.  Avoid alcohol.  Do not smoke. If you need help quitting, talk with your health care provider.  Eat small frequent meals instead of three large heavy meals. WHAT FOODS CAN I EAT? Grains Include healthy grains and starches such as potatoes, wheat bread, fiber-rich cereal, and brown rice. Choose whole grain options whenever possible. In adults, whole grains should account for 45-65% of your daily calories.  Fruits and Vegetables Eat plenty of fruits and vegetables. Fresh fruits and vegetables add fiber to your diet. Meats and Other Protein Sources Eat lean meat such as chicken and pork. Trim any fat off of meat before cooking it. Eggs, fish, and beans are other sources of protein. In adults, these foods should account for 10-35% of your daily calories. Dairy Choose low-fat milk and dairy options. Dairy includes fat and protein, as well as calcium.  Fats and Oils Limit high-fat foods such as fried foods, sweets, baked goods, sugary drinks.  Other Creamy sauces and condiments, such as mayonnaise, can add extra fat. Think about whether or not you need to use them, or use smaller amounts or low fat  options. WHAT FOODS ARE NOT RECOMMENDED?  High fat foods, such as:  [employer].  Ice cream.  [place] toast.  Sweet rolls.  Pizza.  Cheese bread.  Foods covered with batter, butter, creamy sauces, or cheese.  Fried foods.  Sugary drinks and desserts.  Foods that cause gas or bloating   This information is not intended to replace advice given to you by your health care provider. Make sure you discuss any questions you have with your health care provider.   Document Released: 02/27/2013 Document Reviewed: 02/27/2013 Elsevier Interactive Patient Education [employer].  Please follow-up with your primary care physician for a recheck of your abdomen and maintain a strict bland diet. Return to the emergency department at any time with worsening pain or inability to tolerate fluids by mouth.

## 2015-02-20 NOTE — ED Notes (Signed)
Pt. Is unable to urinate at this time.  

## 2015-02-20 NOTE — Progress Notes (Signed)
Pt confirms pcp is christa pannell EPIC updated

## 2015-02-20 NOTE — ED Notes (Signed)
md at bedside  Pt alert and oriented x4. Respirations even and unlabored, bilateral symmetrical rise and fall of chest. Skin warm and dry. In no acute distress. Denies needs.   

## 2015-03-14 ENCOUNTER — Encounter (HOSPITAL_COMMUNITY): Payer: Self-pay

## 2015-03-14 ENCOUNTER — Emergency Department (HOSPITAL_COMMUNITY): Payer: BLUE CROSS/BLUE SHIELD

## 2015-03-14 ENCOUNTER — Emergency Department (HOSPITAL_COMMUNITY)
Admission: EM | Admit: 2015-03-14 | Discharge: 2015-03-14 | Discharge status: Against Medical Advice | Payer: BLUE CROSS/BLUE SHIELD | Attending: Emergency Medicine | Admitting: Emergency Medicine

## 2015-03-14 DIAGNOSIS — R0789 Other chest pain: Secondary | ICD-10-CM | POA: Insufficient documentation

## 2015-03-14 DIAGNOSIS — R197 Diarrhea, unspecified: Secondary | ICD-10-CM | POA: Insufficient documentation

## 2015-03-14 DIAGNOSIS — R079 Chest pain, unspecified: Secondary | ICD-10-CM | POA: Diagnosis present

## 2015-03-14 DIAGNOSIS — R1110 Vomiting, unspecified: Secondary | ICD-10-CM | POA: Insufficient documentation

## 2015-03-14 DIAGNOSIS — I10 Essential (primary) hypertension: Secondary | ICD-10-CM | POA: Insufficient documentation

## 2015-03-14 DIAGNOSIS — E119 Type 2 diabetes mellitus without complications: Secondary | ICD-10-CM | POA: Insufficient documentation

## 2015-03-14 NOTE — ED Notes (Signed)
Called pt twice for blood draw, no answer

## 2015-03-14 NOTE — ED Notes (Signed)
Pt called several times for lab draw w/o answer.  It is assumed that she left.

## 2015-03-14 NOTE — ED Notes (Signed)
No answer for blood draw at this time. Triage RN made aware.

## 2015-03-14 NOTE — ED Notes (Signed)
Pt c/o center chest tightness and n/v/d starting this morning.  Pain score 10/10.  Pt reports similar pain a couple weeks ago and she was diagnosed w/ pancreatitis.  Hx of HTN and DM.

## 2015-04-14 IMAGING — CR DG CHEST 2V
2 series · 2 of 2 positions shown · non-contrast
Comparison: Chest CTA 12/10/2012 and earlier.

CLINICAL DATA: 47-year-old female with chest pain shortness of
breath cough and fever. Initial encounter.

EXAM:
CHEST  2 VIEW

[w chest lat]
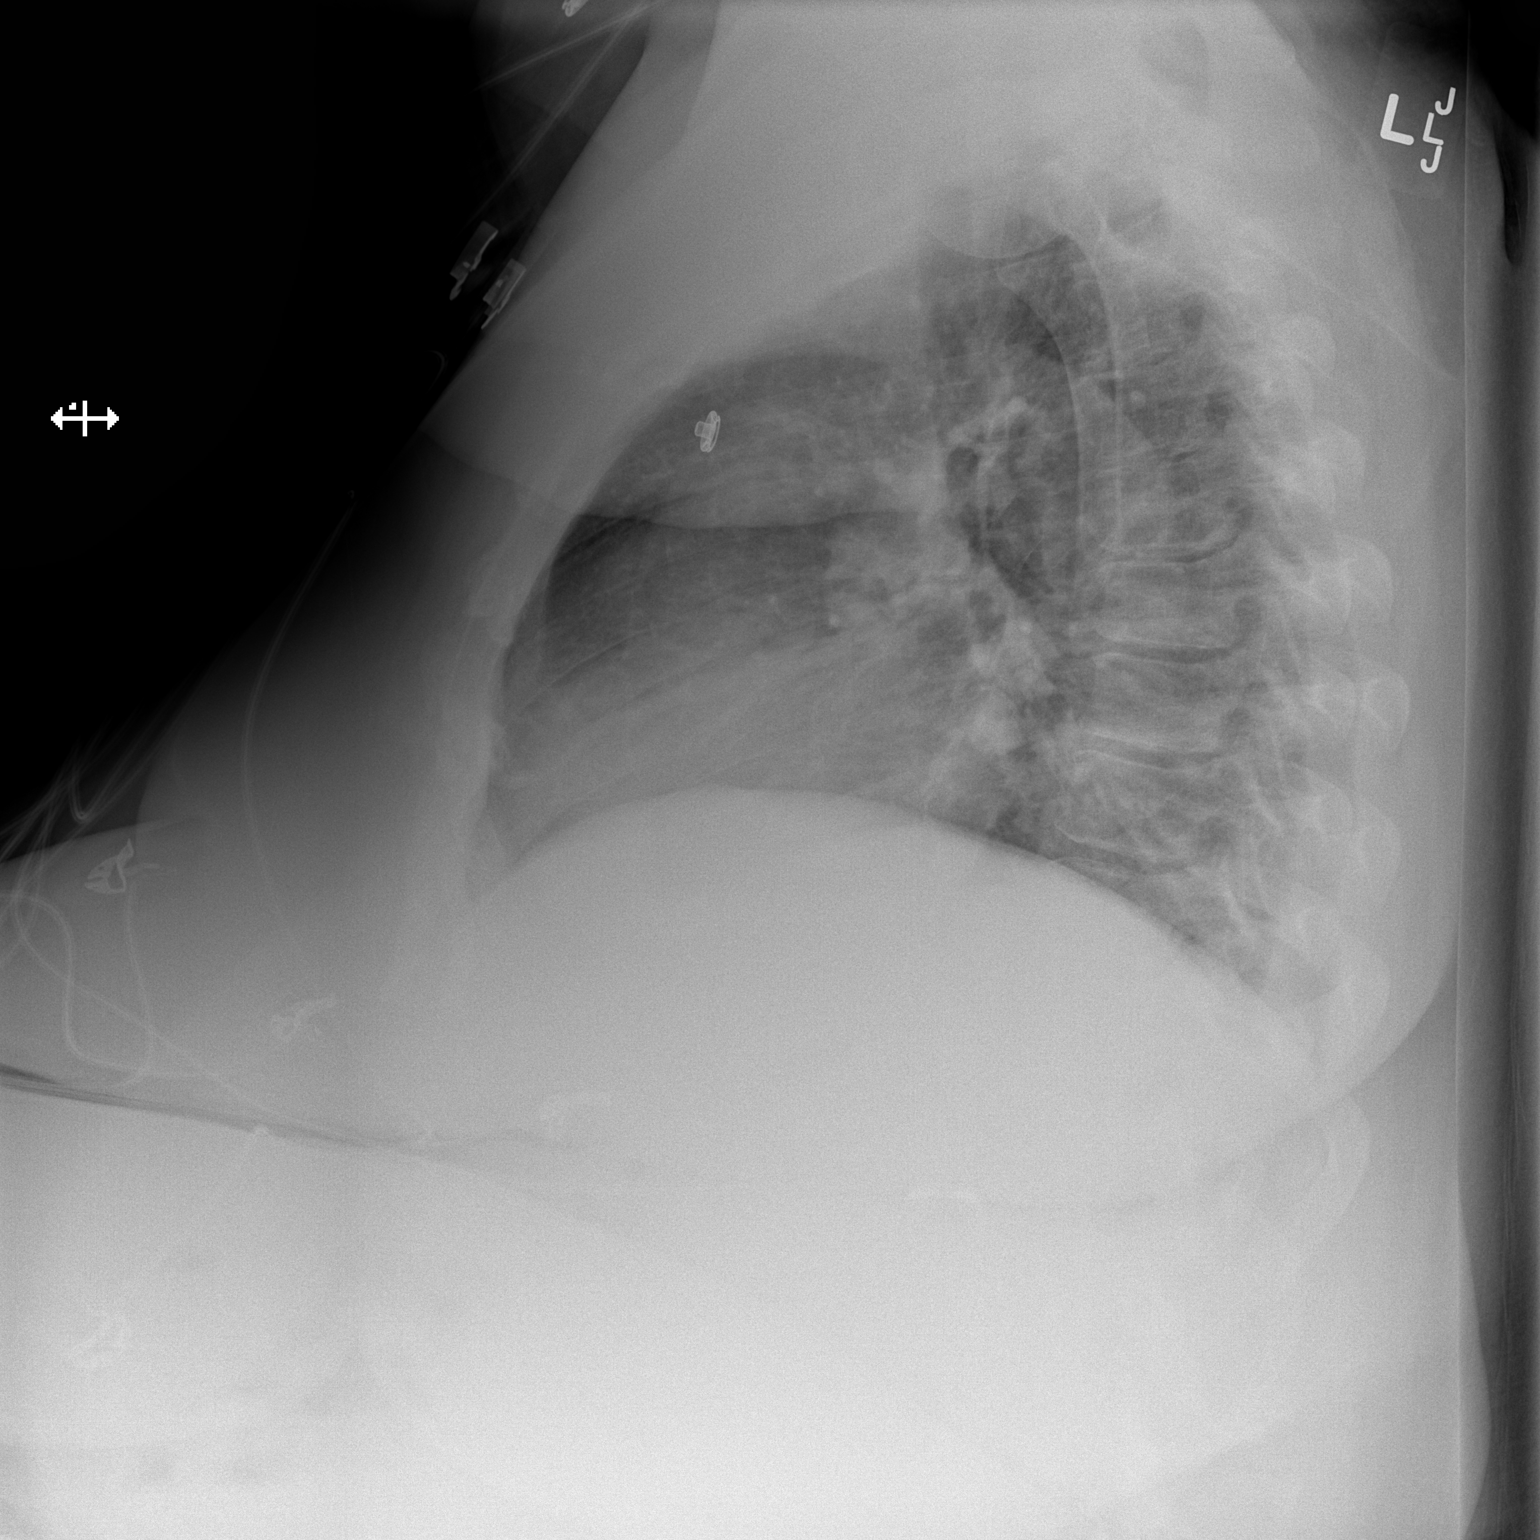

[x chest ap]
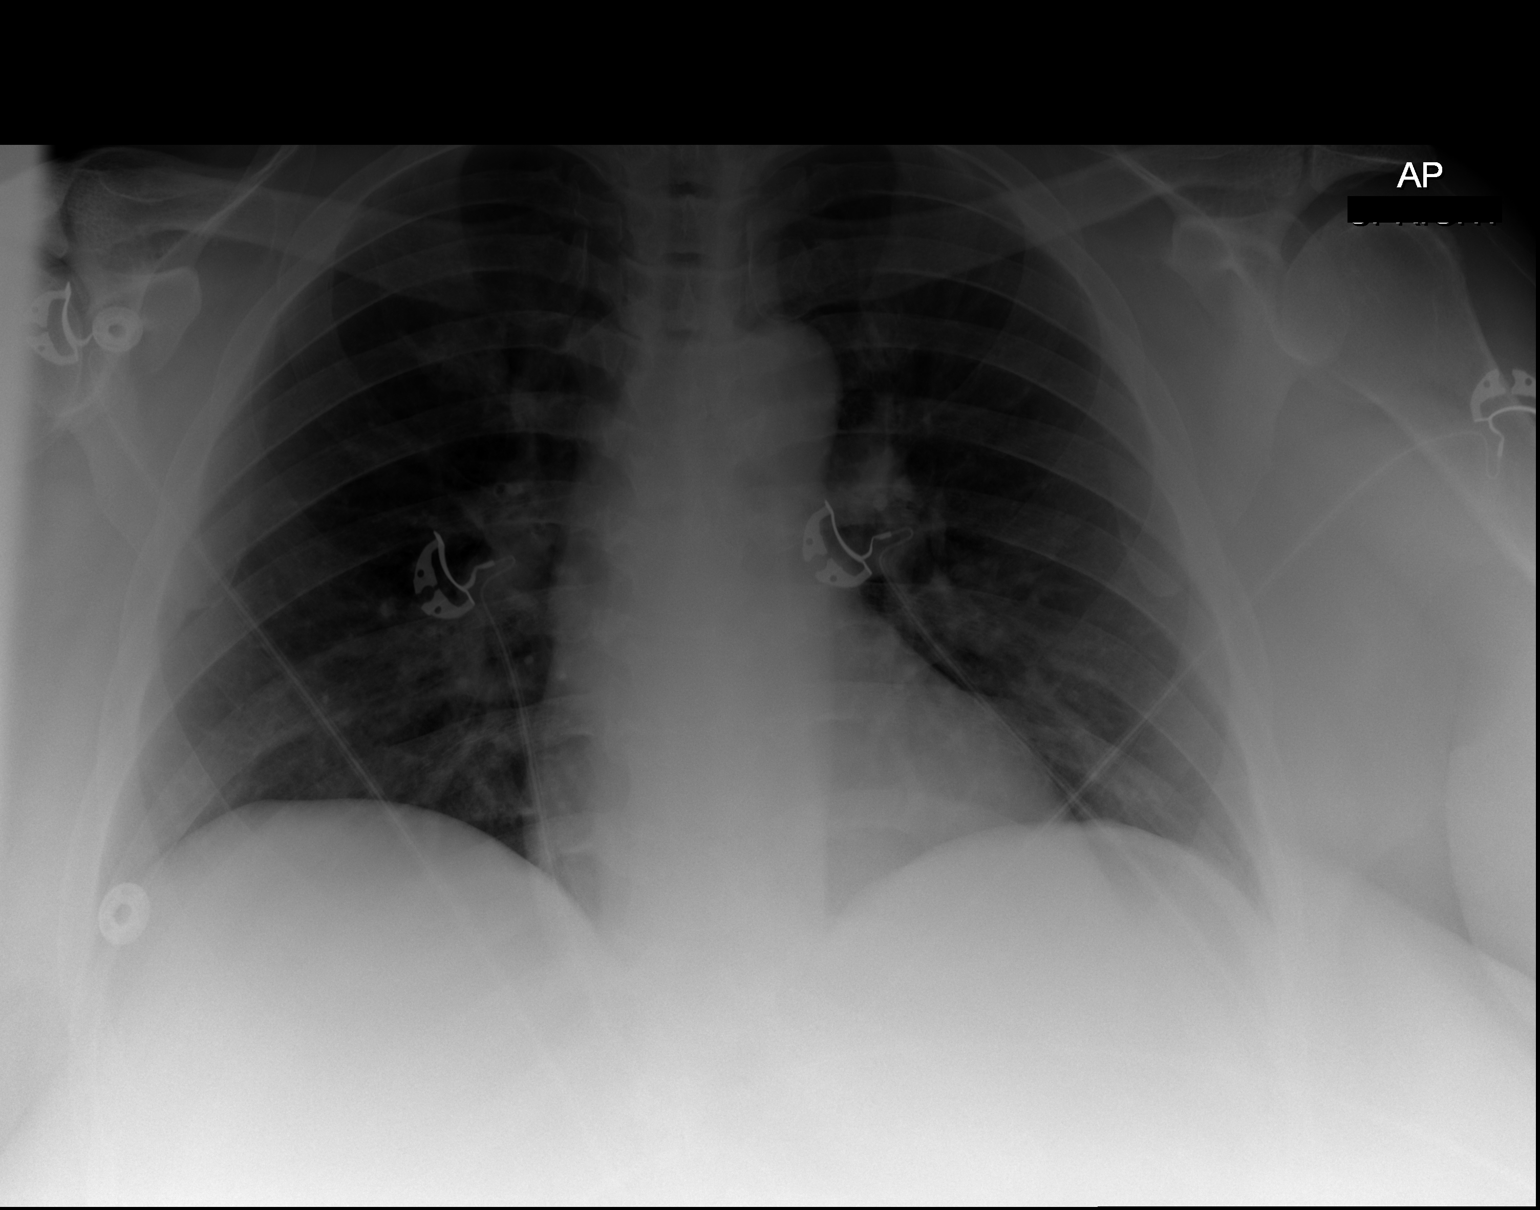

[2 of 2 positions shown; findings below may reference images not displayed]

FINDINGS: Upright AP and lateral views of the chest. Large body habitus.
Stable to slightly lower lung volumes on both views. Normal cardiac
size and mediastinal contours. Visualized tracheal air column is
within normal limits. No pneumothorax, pulmonary edema, pleural
effusion or confluent pulmonary opacity. No acute osseous
abnormality identified.
IMPRESSION: No acute cardiopulmonary abnormality.

## 2015-05-25 ENCOUNTER — Emergency Department (HOSPITAL_COMMUNITY): Payer: BLUE CROSS/BLUE SHIELD

## 2015-05-25 ENCOUNTER — Inpatient Hospital Stay (HOSPITAL_COMMUNITY)
Admission: EM | Admit: 2015-05-25 | Discharge: 2015-05-31 | DRG: 074 | Disposition: A | Discharge status: Home / Self Care | Payer: BLUE CROSS/BLUE SHIELD | Attending: Internal Medicine | Admitting: Internal Medicine

## 2015-05-25 ENCOUNTER — Encounter (HOSPITAL_COMMUNITY): Payer: Self-pay | Admitting: Emergency Medicine

## 2015-05-25 DIAGNOSIS — E1310 Other specified diabetes mellitus with ketoacidosis without coma: Secondary | ICD-10-CM | POA: Diagnosis not present

## 2015-05-25 DIAGNOSIS — N390 Urinary tract infection, site not specified: Secondary | ICD-10-CM | POA: Diagnosis present

## 2015-05-25 DIAGNOSIS — R112 Nausea with vomiting, unspecified: Secondary | ICD-10-CM | POA: Diagnosis present

## 2015-05-25 DIAGNOSIS — Z7982 Long term (current) use of aspirin: Secondary | ICD-10-CM

## 2015-05-25 DIAGNOSIS — A084 Viral intestinal infection, unspecified: Secondary | ICD-10-CM | POA: Diagnosis present

## 2015-05-25 DIAGNOSIS — I129 Hypertensive chronic kidney disease with stage 1 through stage 4 chronic kidney disease, or unspecified chronic kidney disease: Secondary | ICD-10-CM | POA: Diagnosis present

## 2015-05-25 DIAGNOSIS — K259 Gastric ulcer, unspecified as acute or chronic, without hemorrhage or perforation: Secondary | ICD-10-CM | POA: Diagnosis present

## 2015-05-25 DIAGNOSIS — R109 Unspecified abdominal pain: Secondary | ICD-10-CM | POA: Diagnosis not present

## 2015-05-25 DIAGNOSIS — E1169 Type 2 diabetes mellitus with other specified complication: Secondary | ICD-10-CM | POA: Diagnosis not present

## 2015-05-25 DIAGNOSIS — Z8673 Personal history of transient ischemic attack (TIA), and cerebral infarction without residual deficits: Secondary | ICD-10-CM | POA: Diagnosis not present

## 2015-05-25 DIAGNOSIS — K3184 Gastroparesis: Secondary | ICD-10-CM | POA: Diagnosis present

## 2015-05-25 DIAGNOSIS — Z794 Long term (current) use of insulin: Secondary | ICD-10-CM | POA: Diagnosis not present

## 2015-05-25 DIAGNOSIS — I10 Essential (primary) hypertension: Secondary | ICD-10-CM | POA: Diagnosis not present

## 2015-05-25 DIAGNOSIS — IMO0002 Reserved for concepts with insufficient information to code with codable children: Secondary | ICD-10-CM

## 2015-05-25 DIAGNOSIS — R52 Pain, unspecified: Secondary | ICD-10-CM

## 2015-05-25 DIAGNOSIS — E1143 Type 2 diabetes mellitus with diabetic autonomic (poly)neuropathy: Secondary | ICD-10-CM | POA: Diagnosis present

## 2015-05-25 DIAGNOSIS — N183 Chronic kidney disease, stage 3 (moderate): Secondary | ICD-10-CM | POA: Diagnosis present

## 2015-05-25 DIAGNOSIS — N1832 Chronic kidney disease, stage 3b: Secondary | ICD-10-CM | POA: Diagnosis present

## 2015-05-25 DIAGNOSIS — M109 Gout, unspecified: Secondary | ICD-10-CM | POA: Diagnosis not present

## 2015-05-25 DIAGNOSIS — Z6841 Body Mass Index (BMI) 40.0 and over, adult: Secondary | ICD-10-CM | POA: Diagnosis not present

## 2015-05-25 DIAGNOSIS — F329 Major depressive disorder, single episode, unspecified: Secondary | ICD-10-CM

## 2015-05-25 DIAGNOSIS — E1165 Type 2 diabetes mellitus with hyperglycemia: Secondary | ICD-10-CM

## 2015-05-25 DIAGNOSIS — E1122 Type 2 diabetes mellitus with diabetic chronic kidney disease: Secondary | ICD-10-CM | POA: Diagnosis present

## 2015-05-25 DIAGNOSIS — E785 Hyperlipidemia, unspecified: Secondary | ICD-10-CM | POA: Diagnosis present

## 2015-05-25 DIAGNOSIS — E876 Hypokalemia: Secondary | ICD-10-CM | POA: Diagnosis present

## 2015-05-25 DIAGNOSIS — E11610 Type 2 diabetes mellitus with diabetic neuropathic arthropathy: Secondary | ICD-10-CM | POA: Diagnosis present

## 2015-05-25 DIAGNOSIS — E1110 Type 2 diabetes mellitus with ketoacidosis without coma: Secondary | ICD-10-CM

## 2015-05-25 DIAGNOSIS — E1140 Type 2 diabetes mellitus with diabetic neuropathy, unspecified: Secondary | ICD-10-CM

## 2015-05-25 DIAGNOSIS — A599 Trichomoniasis, unspecified: Secondary | ICD-10-CM | POA: Diagnosis present

## 2015-05-25 DIAGNOSIS — N182 Chronic kidney disease, stage 2 (mild): Secondary | ICD-10-CM | POA: Diagnosis present

## 2015-05-25 DIAGNOSIS — D72829 Elevated white blood cell count, unspecified: Secondary | ICD-10-CM | POA: Diagnosis present

## 2015-05-25 DIAGNOSIS — R0602 Shortness of breath: Secondary | ICD-10-CM

## 2015-05-25 DIAGNOSIS — R1030 Lower abdominal pain, unspecified: Secondary | ICD-10-CM | POA: Diagnosis present

## 2015-05-25 DIAGNOSIS — M10072 Idiopathic gout, left ankle and foot: Secondary | ICD-10-CM | POA: Diagnosis present

## 2015-05-25 DIAGNOSIS — D649 Anemia, unspecified: Secondary | ICD-10-CM | POA: Diagnosis present

## 2015-05-25 DIAGNOSIS — E119 Type 2 diabetes mellitus without complications: Secondary | ICD-10-CM | POA: Diagnosis present

## 2015-05-25 DIAGNOSIS — N184 Chronic kidney disease, stage 4 (severe): Secondary | ICD-10-CM | POA: Diagnosis present

## 2015-05-25 HISTORY — DX: Pneumonia, unspecified organism: J18.9

## 2015-05-25 HISTORY — DX: Gastric ulcer, unspecified as acute or chronic, without hemorrhage or perforation: K25.9

## 2015-05-25 LAB — CBC
HEMATOCRIT: 36.4 % (ref 36.0–46.0)
HEMOGLOBIN: 11.8 g/dL — AB (ref 12.0–15.0)
MCH: 29.3 pg (ref 26.0–34.0)
MCHC: 32.4 g/dL (ref 30.0–36.0)
MCV: 90.3 fL (ref 78.0–100.0)
Platelets: 345 10*3/uL (ref 150–400)
RBC: 4.03 MIL/uL (ref 3.87–5.11)
RDW: 14.2 % (ref 11.5–15.5)
WBC: 13.2 10*3/uL — ABNORMAL HIGH (ref 4.0–10.5)

## 2015-05-25 LAB — BASIC METABOLIC PANEL
ANION GAP: 19 — AB (ref 5–15)
ANION GAP: 12 (ref 5–15)
BUN: 15 mg/dL (ref 6–20)
BUN: 16 mg/dL (ref 6–20)
CALCIUM: 9.2 mg/dL (ref 8.9–10.3)
CHLORIDE: 93 mmol/L — AB (ref 101–111)
CO2: 24 mmol/L (ref 22–32)
CO2: 27 mmol/L (ref 22–32)
CREATININE: 1.25 mg/dL — AB (ref 0.44–1.00)
Calcium: 9.9 mg/dL (ref 8.9–10.3)
Chloride: 100 mmol/L — ABNORMAL LOW (ref 101–111)
Creatinine, Ser: 1.12 mg/dL — ABNORMAL HIGH (ref 0.44–1.00)
GFR calc Af Amer: >60 mL/min (ref >60)
GFR, EST AFRICAN AMERICAN: 57 mL/min — AB (ref >60)
GFR, EST NON AFRICAN AMERICAN: 56 mL/min — AB (ref >60)
GFR, EST NON AFRICAN AMERICAN: 49 mL/min — AB (ref >60)
GLUCOSE: 430 mg/dL — AB (ref 65–99)
GLUCOSE: 214 mg/dL — AB (ref 65–99)
POTASSIUM: 3.7 mmol/L (ref 3.5–5.1)
Potassium: 3.3 mmol/L — ABNORMAL LOW (ref 3.5–5.1)
Sodium: 136 mmol/L (ref 135–145)
Sodium: 139 mmol/L (ref 135–145)

## 2015-05-25 LAB — CBG MONITORING, ED
GLUCOSE-CAPILLARY: 383 mg/dL — AB (ref 65–99)
GLUCOSE-CAPILLARY: 402 mg/dL — AB (ref 65–99)
GLUCOSE-CAPILLARY: 335 mg/dL — AB (ref 65–99)
GLUCOSE-CAPILLARY: 273 mg/dL — AB (ref 65–99)
GLUCOSE-CAPILLARY: 222 mg/dL — AB (ref 65–99)
Glucose-Capillary: 361 mg/dL — ABNORMAL HIGH (ref 65–99)
Glucose-Capillary: 254 mg/dL — ABNORMAL HIGH (ref 65–99)
Glucose-Capillary: 259 mg/dL — ABNORMAL HIGH (ref 65–99)

## 2015-05-25 LAB — D-DIMER, QUANTITATIVE: D-Dimer, Quant: <0.27 ug/mL-FEU (ref 0.00–0.50)

## 2015-05-25 LAB — I-STAT TROPONIN, ED: Troponin i, poc: 0 ng/mL (ref 0.00–0.08)

## 2015-05-25 MED ORDER — METOPROLOL SUCCINATE ER 25 MG PO TB24
25.0000 mg | ORAL_TABLET | Freq: Every day | ORAL | Status: DC
  Administered 2015-05-25 – 2015-05-31 (×7): 25 mg via ORAL
  Filled 2015-05-25 (×7): qty 1

## 2015-05-25 MED ORDER — ADULT MULTIVITAMIN W/MINERALS CH
1.0000 | ORAL_TABLET | Freq: Every day | ORAL | Status: DC
  Administered 2015-05-25 – 2015-05-31 (×7): 1 via ORAL
  Filled 2015-05-25 (×7): qty 1

## 2015-05-25 MED ORDER — GABAPENTIN 300 MG PO CAPS
300.0000 mg | ORAL_CAPSULE | Freq: Three times a day (TID) | ORAL | Status: DC
  Administered 2015-05-26 (×2): 300 mg via ORAL
  Filled 2015-05-25 (×11): qty 1

## 2015-05-25 MED ORDER — ACETAMINOPHEN 325 MG PO TABS: 650.0000 mg | ORAL_TABLET | Freq: Three times a day (TID) | ORAL | Status: DC | PRN

## 2015-05-25 MED ORDER — DEXTROSE-NACL 5-0.45 % IV SOLN: INTRAVENOUS | Status: DC

## 2015-05-25 MED ORDER — DEXTROSE-NACL 5-0.45 % IV SOLN
INTRAVENOUS | Status: DC
  Administered 2015-05-25: 23:00:00 via INTRAVENOUS

## 2015-05-25 MED ORDER — ONDANSETRON HCL 4 MG/2ML IJ SOLN
4.0000 mg | Freq: Once | INTRAMUSCULAR | Status: AC | PRN
  Administered 2015-05-25: 4 mg via INTRAVENOUS
  Filled 2015-05-25: qty 2

## 2015-05-25 MED ORDER — DULOXETINE HCL 60 MG PO CPEP
60.0000 mg | ORAL_CAPSULE | Freq: Every day | ORAL | Status: DC
  Administered 2015-05-25 – 2015-05-31 (×7): 60 mg via ORAL
  Filled 2015-05-25: qty 2
  Filled 2015-05-25 (×3): qty 1
  Filled 2015-05-25: qty 2
  Filled 2015-05-25 (×2): qty 1

## 2015-05-25 MED ORDER — FENTANYL CITRATE (PF) 100 MCG/2ML IJ SOLN
100.0000 ug | Freq: Once | INTRAMUSCULAR | Status: AC
  Administered 2015-05-25: 100 ug via INTRAVENOUS
  Filled 2015-05-25: qty 2

## 2015-05-25 MED ORDER — SODIUM CHLORIDE 0.9 % IV SOLN
INTRAVENOUS | Status: DC
  Administered 2015-05-26: 2.2 [IU]/h via INTRAVENOUS
  Filled 2015-05-25: qty 2.5

## 2015-05-25 MED ORDER — METOCLOPRAMIDE HCL 5 MG/ML IJ SOLN
10.0000 mg | Freq: Once | INTRAMUSCULAR | Status: AC
  Administered 2015-05-25: 10 mg via INTRAVENOUS
  Filled 2015-05-25: qty 2

## 2015-05-25 MED ORDER — SODIUM CHLORIDE 0.9 % IV SOLN: INTRAVENOUS | Status: DC

## 2015-05-25 MED ORDER — ASPIRIN 325 MG PO TABS
325.0000 mg | ORAL_TABLET | Freq: Every day | ORAL | Status: DC
  Administered 2015-05-25: 325 mg via ORAL
  Filled 2015-05-25 (×2): qty 1

## 2015-05-25 MED ORDER — AMLODIPINE BESYLATE 10 MG PO TABS
10.0000 mg | ORAL_TABLET | Freq: Every day | ORAL | Status: DC
Start: 2015-05-25 — End: 2015-05-31
  Administered 2015-05-26 – 2015-05-31 (×6): 10 mg via ORAL
  Filled 2015-05-25 (×7): qty 1

## 2015-05-25 MED ORDER — SODIUM CHLORIDE 0.9 % IV BOLUS (SEPSIS)
500.0000 mL | Freq: Once | INTRAVENOUS | Status: AC
  Administered 2015-05-25: 500 mL via INTRAVENOUS

## 2015-05-25 MED ORDER — SODIUM CHLORIDE 0.9 % IV BOLUS (SEPSIS)
1000.0000 mL | Freq: Once | INTRAVENOUS | Status: AC
  Administered 2015-05-25: 1000 mL via INTRAVENOUS

## 2015-05-25 MED ORDER — ALBUTEROL SULFATE HFA 108 (90 BASE) MCG/ACT IN AERS: 2.0000 | INHALATION_SPRAY | Freq: Four times a day (QID) | RESPIRATORY_TRACT | Status: DC | PRN

## 2015-05-25 MED ORDER — ALBUTEROL SULFATE (2.5 MG/3ML) 0.083% IN NEBU
2.5000 mg | INHALATION_SOLUTION | Freq: Four times a day (QID) | RESPIRATORY_TRACT | Status: DC | PRN
  Administered 2015-05-27: 2.5 mg via RESPIRATORY_TRACT
  Filled 2015-05-25: qty 3

## 2015-05-25 MED ORDER — HYDROMORPHONE HCL 1 MG/ML IJ SOLN
1.0000 mg | Freq: Once | INTRAMUSCULAR | Status: AC
  Administered 2015-05-25: 1 mg via INTRAVENOUS
  Filled 2015-05-25: qty 1

## 2015-05-25 MED ORDER — PRAVASTATIN SODIUM 20 MG PO TABS
10.0000 mg | ORAL_TABLET | Freq: Every day | ORAL | Status: DC
  Administered 2015-05-25 – 2015-05-30 (×7): 10 mg via ORAL
  Filled 2015-05-25 (×6): qty 1

## 2015-05-25 MED ORDER — PANTOPRAZOLE SODIUM 40 MG IV SOLR
40.0000 mg | Freq: Once | INTRAVENOUS | Status: AC
  Administered 2015-05-25: 40 mg via INTRAVENOUS
  Filled 2015-05-25: qty 40

## 2015-05-25 MED ORDER — SODIUM CHLORIDE 0.9 % IV SOLN
INTRAVENOUS | Status: DC
  Administered 2015-05-25: 3.4 [IU]/h via INTRAVENOUS
  Filled 2015-05-25: qty 2.5

## 2015-05-25 MED ORDER — ALLOPURINOL 100 MG PO TABS
100.0000 mg | ORAL_TABLET | Freq: Every day | ORAL | Status: DC
  Administered 2015-05-25 – 2015-05-31 (×7): 100 mg via ORAL
  Filled 2015-05-25 (×7): qty 1

## 2015-05-25 MED ORDER — POTASSIUM CHLORIDE 10 MEQ/100ML IV SOLN
10.0000 meq | INTRAVENOUS | Status: AC
  Administered 2015-05-26 (×2): 10 meq via INTRAVENOUS
  Filled 2015-05-25 (×2): qty 100

## 2015-05-25 NOTE — ED Notes (Signed)
Pt c/o chest pain, SOB, abdominal pain. Is actively vomiting and hyperventilating in triage with a HR or 136. Pt is a diabetic. Pt states she's unable to keep down food or fluid.

## 2015-05-25 NOTE — ED Notes (Signed)
Pt transported to Xray. 

## 2015-05-25 NOTE — ED Notes (Signed)
Pt repeatedly sticking her finger down her throat, stating she feels like something is blocking her from vomiting.

## 2015-05-25 NOTE — ED Notes (Signed)
Pt reports n/v x 2 days with chest pain starting today and radiating into her L arm. Pt states it feels like she has heart burn really bad. Pt actively vomiting.

## 2015-05-25 NOTE — ED Notes (Signed)
According to ICU C.N., Colletta [place] her bed will not be available until ~2000 hours.

## 2015-05-25 NOTE — ED Provider Notes (Signed)
[name]: [id number]     Arrival date & time 05/25/15  1231 History   First MD Initiated Contact with Patient 05/25/15 1321     Chief Complaint  Patient presents with  . Chest Pain  . Abdominal Pain     (Consider location/radiation/quality/duration/timing/severity/associated sxs/prior Treatment) HPI   Patient is a 50 year old female past medical history of diabetes, hypertension, CVA who presents the ED with multiple complaints. Patient reports having diffuse abdominal pain with associated nausea and NBNB vomiting for the past 2 days. She also reports having associated midsternal chest pain which she reports as a constant sharp/burning pain, denies any aggravating or alleviating factors. She notes her chest pain radiates into her left arm. Patient reports she has been taking Zofran at home without relief. Patient reports she had similar symptoms a few weeks ago resulting in her being admitted to [facility]. Denies fever, chills, body aches, nasal congestion, rhinorrhea, cough, shortness of breath, hematemesis, diarrhea, constipation, urinary symptoms.  Past Medical History  Diagnosis Date  . Diabetes mellitus   . Hypertension   . Stroke ([place]) 02/2013  . Pneumonia   . Gastric ulcer    Past Surgical History  Procedure Laterality Date  . No past surgeries    . Cataract extraction  01/2014   Family History  Problem Relation Age of Onset  . Diabetes Mother   . Diabetes Father   . Heart disease Father   . Diabetes Sister   . Diabetes Brother    Social History  Substance Use Topics  . Smoking status: Never Smoker   . Smokeless tobacco: Never Used  . Alcohol Use: No   OB History    No data available     Review of Systems  Cardiovascular: Positive for chest pain.  Gastrointestinal: Positive for nausea, vomiting and abdominal pain.  All other systems reviewed and are negative.     Allergies  Penicillins; Valium; Lisinopril; and Food  Home Medications    Prior to Admission medications   Medication Sig Start Date End Date Taking? Authorizing Provider  acetaminophen (TYLENOL) 650 MG CR tablet Take 650 mg by mouth every 8 (eight) hours as needed for pain.   Yes Historical Provider, MD  albuterol (PROVENTIL HFA;VENTOLIN HFA) 108 (90 BASE) MCG/ACT inhaler Inhale 2 puffs into the lungs every 6 (six) hours as needed for wheezing or shortness of breath.   Yes Historical Provider, MD  allopurinol (ZYLOPRIM) 100 MG tablet Take 100 mg by mouth daily. 02/14/15  Yes Historical Provider, MD  amLODipine (NORVASC) 10 MG tablet Take 10 mg by mouth daily. 02/11/15  Yes Historical Provider, MD  aspirin 325 MG tablet Take 325 mg by mouth daily.   Yes Historical Provider, MD  DULoxetine (CYMBALTA) 30 MG capsule Take 60 mg by mouth daily. 01/16/15  Yes Historical Provider, MD  gabapentin (NEURONTIN) 300 MG capsule Take 300 mg by mouth 3 (three) times daily. 05/15/15  Yes Historical Provider, MD  hydrochlorothiazide (HYDRODIURIL) 25 MG tablet Take 1 tablet (25 mg total) by mouth daily. 09/27/13  Yes Radhika P Phadke, MD  ibuprofen (ADVIL,MOTRIN) 200 MG tablet Take 400 mg by mouth daily as needed for moderate pain.   Yes Historical Provider, MD  metoprolol succinate (TOPROL-XL) 25 MG 24 hr tablet Take 25 mg by mouth daily.   Yes Historical Provider, MD  Multiple Vitamins-Minerals (MULTIVITAMIN WITH MINERALS) tablet Take 1 tablet by mouth daily.   Yes Historical Provider, MD  NOVOLOG MIX 70/30 (70-30) 100 UNIT/ML injection  Inject 50-75 Units as directed 3 (three) times daily after meals. 75 units every morning, 50 units every afternoon, 75 units every night 02/17/15  Yes Historical Provider, MD  pantoprazole (PROTONIX) 40 MG tablet Take 40 mg by mouth 2 (two) times daily. 05/23/15  Yes Historical Provider, MD  pravastatin (PRAVACHOL) 10 MG tablet Take 10 mg by mouth daily. 05/22/15  Yes Historical Provider, MD  ranitidine (ZANTAC) 150 MG tablet Take 150 mg by mouth daily as  needed for heartburn.    Yes Historical Provider, MD  Vitamin D, Ergocalciferol, (DRISDOL) 50000 UNITS CAPS capsule Take 50,000 Units by mouth every 7 (seven) days.    Historical Provider, MD   BP 139/86 mmHg  Pulse 122  Temp(Src) 98.1 F (36.7 C) (Oral)  Resp 20  SpO2 95% Physical Exam  Constitutional: She is oriented to person, place, and time. She appears well-developed and well-nourished.  Morbidly obese female. Patient actively vomiting. During exam patient is sticking her fingers down her throat multiple times to try to make herself vomit.  HENT:  Head: Normocephalic and atraumatic.  Mouth/Throat: Uvula is midline, oropharynx is clear and moist and mucous membranes are normal. No oropharyngeal exudate, posterior oropharyngeal edema, posterior oropharyngeal erythema or tonsillar abscesses.  Eyes: Conjunctivae and EOM are normal. Right eye exhibits no discharge. Left eye exhibits no discharge. No scleral icterus.  Neck: Normal range of motion. Neck supple.  Cardiovascular: Regular rhythm, normal heart sounds and intact distal pulses.   Tachycardic, HR 125  Pulmonary/Chest: Effort normal and breath sounds normal. No respiratory distress. She has no wheezes. She has no rales. She exhibits tenderness (diffuse anterior chest wall tenderness palpation).  Abdominal: Soft. Bowel sounds are normal. She exhibits no distension and no mass. There is tenderness (mild diffuse tenderness). There is no rebound and no guarding.  Musculoskeletal: Normal range of motion. She exhibits no edema.  Neurological: She is alert and oriented to person, place, and time.  Skin: Skin is warm and dry.  Nursing note and vitals reviewed.   ED Course  .Critical Care Performed by: Nona Dell Authorized by: Nona Dell Total critical care time: 30 minutes Critical care time was exclusive of separately billable procedures and treating other patients. Critical care was necessary to treat  or prevent imminent or life-threatening deterioration of the following conditions: metabolic crisis (DKA). Critical care was time spent personally by me on the following activities: blood draw for specimens, development of treatment plan with patient or surrogate, discussions with consultants, discussions with primary provider, evaluation of patient's response to treatment, examination of patient, obtaining history from patient or surrogate, ordering and performing treatments and interventions, ordering and review of laboratory studies, ordering and review of radiographic studies, pulse oximetry, re-evaluation of patient's condition and review of old charts.   (including critical care time) Labs Review Labs Reviewed  BASIC METABOLIC PANEL - Abnormal; Notable for the following:    Chloride 93 (*)    Glucose, Bld 430 (*)    Creatinine, Ser 1.12 (*)    GFR calc non Af Amer 56 (*)    Anion gap 19 (*)    All other components within normal limits  CBC - Abnormal; Notable for the following:    WBC 13.2 (*)    Hemoglobin 11.8 (*)    All other components within normal limits  CBG MONITORING, ED - Abnormal; Notable for the following:    Glucose-Capillary 383 (*)    All other components within normal limits  CBG  MONITORING, ED - Abnormal; Notable for the following:    Glucose-Capillary 402 (*)    All other components within normal limits  D-DIMER, QUANTITATIVE (NOT AT [facility])  URINALYSIS, ROUTINE W REFLEX MICROSCOPIC (NOT AT [facility])  Randolm Idol, ED    Imaging Review Dg Chest 2 View  05/25/2015  CLINICAL DATA:  CP, SOB and vomiting x 2 days. Hx of DM and HTN. EXAM: CHEST  2 VIEW COMPARISON:  03/14/2015 FINDINGS: The heart size and mediastinal contours are within normal limits. Both lungs are clear. The visualized skeletal structures are unremarkable. IMPRESSION: No active cardiopulmonary disease. Electronically Signed   By: Nolon Nations M.D.   On: 05/25/2015 13:40   I have personally  reviewed and evaluated these images and lab results as part of my medical decision-making.   EKG Interpretation   Date/Time:  Sunday May 25 2015 12:41:04 EDT Ventricular Rate:  125 PR Interval:  157 QRS Duration: 80 QT Interval:  316 QTC Calculation: 456 R Axis:   -52 Text Interpretation:  Sinus tachycardia Left anterior fascicular block  RSR' in V1 or V2, right VCD or RVH Baseline wander in lead(s) V2 Confirmed  by COOK  MD, BRIAN ([id number]) on 05/25/2015 1:53:21 PM      MDM   Final diagnoses:  Diabetic ketoacidosis without coma associated with type 2 diabetes mellitus ([place])    Patient presents with chest pain, abdominal pain and nausea vomiting. History of diabetes, hypertension and CVA. Heart rate 125, afebrile, remaining vitals stable. Exam revealed mild diffuse abdominal tenderness, no peritoneal signs, anterior chest wall tenderness palpation, remaining exam unremarkable. Patient given IV fluids and IV Zofran and pain meds.   Chart review shows that patient was admitted at [facility] on 04/21/15 for abdominal pain, nausea and vomiting. During her admission patient had EGD done showing a small hiatal hernia, TTE showed left ventricular hypertrophy with normal EF. Patient also had an EGD done in December 2016 showing pyloric ulcer.  Troponin negative. D-dimer negative. Chest x-ray negative. EKG revealed sinus tachycardia. Glucose 4:30. Anion gap 19. WBC 13.2. I suspect patient's symptoms are likely due to DKA evident on labs. Patient given insulin infusion, dextrose infusion and IV fluids. On reevaluation patient is resting comfortably in bed, heart rate 117, respirations 20. Consulted hospitalist. Dr. Charlies Silvers agrees to admission. Orders placed for stepdown bed. Discussed results and plan for admission with patient and family.    Deborah [name], [place] 05/25/15 [place], MD 05/25/15 1521

## 2015-05-25 NOTE — ED Notes (Signed)
CBG in triage 383

## 2015-05-25 NOTE — ED Notes (Signed)
Pt woke up suddenly yelling out and shaking her arms. Pt mother is concerned. PA notified and came to bedside.

## 2015-05-25 NOTE — H&P (Addendum)
[facility] History and Physical  Deborah [name] HWE:[id number] DOB: [birth year] DOA: 05/25/2015  Referring physician: ER physician: Dr. Lacinda Axon; [place] PCP: Ernest Haber, MD  Chief Complaint: nausea and vomiting   HPI:  50 year old female past medical history of diabetes on insulin, depression, hypertension, dyslipidemia who presented to [facility] ED with reports of ongoing nausea and vomiting for past few days prior to this admission. She then started to have abdominal and chest pain as a result of on goign nausea and vomiting. She is taking zantac, zofran at home but this has not provided significant symptomatic relief. No reports of fever. No cough and no shortness of breath. No palpitations. No urinary complaints.   In ED, BP was 100/86, HR 117/128, RR 13, afebrile. Blood work was notable for WBC count of 13.2, hemoglobin 11.8, creatinine 1.12. Glucose was 383, BMP showed glucose of 430, anion gap of 19. She was started on insulin drip.  Assessment & Plan     Principal Problem: DKA / Uncontrolled diabetes mellitus with diabetic neuropathy with long term insulin use - DKA criteria met on admission with elevated glucose, elevated anion gap. UA not available at the time of the admission so unsure about presence of ketones - Started on insulin drip per DKA protocol - Check A1c  - Check CBG's every 1 hour and BMP every 4 hours per DKA protocol - Continue IV fluids - Continue gabapentin for neuropathy  - Monitor in SDU while on insulin drip  Active Problems: Essential hypertension - Continue metoprolol and norvasc   Dyslipidemia associated with type 2 DM - Continue Pravachol   CKD stage 3 - Baseline Cr 1.3 about 2 years ago - Cr at baseline values on this admission   Depression - Continue Cymbalta   DVT prophylaxis:  - SCD"s bilaterally   Radiological Exams on Admission: Dg Chest 2 View 05/25/2015  No active cardiopulmonary disease.    EKG: I have  personally reviewed EKG. EKG shows sinus tachycardia   Code Status: Full Family Communication: Plan of care discussed with the patient  Disposition Plan: Admit for further evaluation, SDU due to requirement for insulin drip   Leisa Lenz, MD  [facility] Pager [phone]  Time spent in minutes: 75 minutes  Review of Systems:  Constitutional: Negative for fever, chills and malaise/fatigue. Negative for diaphoresis.  HENT: Negative for hearing loss, ear pain, nosebleeds, congestion, sore throat, neck pain, tinnitus and ear discharge.   Eyes: Negative for blurred vision, double vision, photophobia, pain, discharge and redness.  Respiratory: Negative for cough, hemoptysis, sputum production, shortness of breath, wheezing and stridor.   Cardiovascular: Negative for chest pain, palpitations, orthopnea, claudication and leg swelling.  Gastrointestinal: per HPI   Genitourinary: Negative for dysuria, urgency, frequency, hematuria and flank pain.  Musculoskeletal: Negative for myalgias, back pain, joint pain and falls.  Skin: Negative for itching and rash.  Neurological: Negative for dizziness and weakness. Negative for tingling, tremors, sensory change, speech change, focal weakness, loss of consciousness and headaches.  Endo/Heme/Allergies: Negative for environmental allergies and polydipsia. Does not bruise/bleed easily.  Psychiatric/Behavioral: Negative for suicidal ideas. The patient is not nervous/anxious.      Past Medical History  Diagnosis Date  . Diabetes mellitus   . Hypertension   . Stroke ([place]) 02/2013  . Pneumonia   . Gastric ulcer    Past Surgical History  Procedure Laterality Date  . No past surgeries    . Cataract extraction  01/2014   Social History:  reports that she has never smoked. She has never used smokeless tobacco. She reports that she does not drink alcohol or use illicit drugs.  Allergies  Allergen Reactions  . Penicillins Palpitations    Has  patient had a PCN reaction causing immediate rash, facial/tongue/throat swelling, SOB or lightheadedness with hypotension: Yes Has patient had a PCN reaction causing severe rash involving mucus membranes or skin necrosis: No Has patient had a PCN reaction that required hospitalization: Yes  Has patient had a PCN reaction occurring within the last 10 years: No   . Valium [Diazepam] [name]  . Lisinopril Swelling    Tongue and mouth swelling  . Food Hives and Swelling    Carrots, ketchup     Family History:  Family History  Problem Relation Age of Onset  . Diabetes Mother   . Diabetes Father   . Heart disease Father   . Diabetes Sister   . Diabetes Brother      Prior to Admission medications   Medication Sig Start Date End Date Taking? Authorizing Provider  acetaminophen (TYLENOL) 650 MG CR tablet Take 650 mg by mouth every 8 (eight) hours as needed for pain.   Yes Historical Provider, MD  albuterol (PROVENTIL HFA;VENTOLIN HFA) 108 (90 BASE) MCG/ACT inhaler Inhale 2 puffs into the lungs every 6 (six) hours as needed for wheezing or shortness of breath.   Yes Historical Provider, MD  allopurinol (ZYLOPRIM) 100 MG tablet Take 100 mg by mouth daily. 02/14/15  Yes Historical Provider, MD  amLODipine (NORVASC) 10 MG tablet Take 10 mg by mouth daily. 02/11/15  Yes Historical Provider, MD  aspirin 325 MG tablet Take 325 mg by mouth daily.   Yes Historical Provider, MD  DULoxetine (CYMBALTA) 30 MG capsule Take 60 mg by mouth daily. 01/16/15  Yes Historical Provider, MD  gabapentin (NEURONTIN) 300 MG capsule Take 300 mg by mouth 3 (three) times daily. 05/15/15  Yes Historical Provider, MD  hydrochlorothiazide (HYDRODIURIL) 25 MG tablet Take 1 tablet (25 mg total) by mouth daily. 09/27/13  Yes Radhika P Phadke, MD  ibuprofen (ADVIL,MOTRIN) 200 MG tablet Take 400 mg by mouth daily as needed for moderate pain.   Yes Historical Provider, MD  metoprolol succinate (TOPROL-XL) 25 MG 24 hr  tablet Take 25 mg by mouth daily.   Yes Historical Provider, MD  Multiple Vitamins-Minerals (MULTIVITAMIN WITH MINERALS) tablet Take 1 tablet by mouth daily.   Yes Historical Provider, MD  NOVOLOG MIX 70/30 (70-30) 100 UNIT/ML injection Inject 50-75 Units as directed 3 (three) times daily after meals. 75 units every morning, 50 units every afternoon, 75 units every night 02/17/15  Yes Historical Provider, MD  pantoprazole (PROTONIX) 40 MG tablet Take 40 mg by mouth 2 (two) times daily. 05/23/15  Yes Historical Provider, MD  pravastatin (PRAVACHOL) 10 MG tablet Take 10 mg by mouth daily. 05/22/15  Yes Historical Provider, MD  ranitidine (ZANTAC) 150 MG tablet Take 150 mg by mouth daily as needed for heartburn.    Yes Historical Provider, MD  Vitamin D, Ergocalciferol, (DRISDOL) 50000 UNITS CAPS capsule Take 50,000 Units by mouth every 7 (seven) days.    Historical Provider, MD   Physical Exam: Filed Vitals:   05/25/15 1308 05/25/15 1400 05/25/15 1410 05/25/15 1412  BP: 159/97 139/86    Pulse:  122    Temp:      TempSrc:      Resp: 22 20    SpO2:  100% 88% 95%  Physical Exam  Constitutional: Appears well-developed and well-nourished. No distress.  HENT: Normocephalic. No tonsillar erythema or exudates Eyes: Conjunctivae are normal. No scleral icterus.  Neck: Normal ROM. Neck supple. No JVD. No tracheal deviation. No thyromegaly.  CVS: tachycardia, S1/S2 +, no murmurs, no gallops, no carotid bruit.  Pulmonary: Effort and breath sounds normal, no stridor, rhonchi, wheezes, rales.  Abdominal: Soft. BS +,  no distension, tenderness, rebound or guarding.  Musculoskeletal: Normal range of motion. No edema and no tenderness.  Lymphadenopathy: No lymphadenopathy noted, cervical, inguinal. Neuro: Alert. Normal reflexes, muscle tone coordination. No focal neurologic deficits. Skin: Skin is warm and dry. No rash noted.  No erythema. No pallor.  Psychiatric: Normal mood and affect. Behavior,  judgment, thought content normal.   Labs on Admission:  Basic Metabolic Panel:  Recent Labs Lab 05/25/15 1301  NA 136  K 3.7  CL 93*  CO2 24  GLUCOSE 430*  BUN 15  CREATININE 1.12*  CALCIUM 9.9   Liver Function Tests: No results for input(s): AST, ALT, ALKPHOS, BILITOT, PROT, ALBUMIN in the last 168 hours. No results for input(s): LIPASE, AMYLASE in the last 168 hours. No results for input(s): AMMONIA in the last 168 hours. CBC:  Recent Labs Lab 05/25/15 1301  WBC 13.2*  HGB 11.8*  HCT 36.4  MCV 90.3  PLT 345   Cardiac Enzymes: No results for input(s): CKTOTAL, CKMB, CKMBINDEX, TROPONINI in the last 168 hours. BNP: Invalid input(s): POCBNP CBG:  Recent Labs Lab 05/25/15 1247 05/25/15 1427  GLUCAP 383* 402*    If 7PM-7AM, please contact night-coverage [email or web address] Password [facility] 05/25/2015, 3:19 PM

## 2015-05-25 NOTE — ED Notes (Signed)
She is drowsy and comfortable in appearance.  I apply O2 per nasal cannula.  Her mom remains with her.

## 2015-05-26 ENCOUNTER — Inpatient Hospital Stay (HOSPITAL_COMMUNITY): Payer: BLUE CROSS/BLUE SHIELD

## 2015-05-26 DIAGNOSIS — R1030 Lower abdominal pain, unspecified: Secondary | ICD-10-CM | POA: Diagnosis present

## 2015-05-26 LAB — BASIC METABOLIC PANEL
ANION GAP: 10 (ref 5–15)
ANION GAP: 11 (ref 5–15)
ANION GAP: 10 (ref 5–15)
Anion gap: 11 (ref 5–15)
BUN: 16 mg/dL (ref 6–20)
BUN: 16 mg/dL (ref 6–20)
BUN: 16 mg/dL (ref 6–20)
BUN: 15 mg/dL (ref 6–20)
CALCIUM: 9 mg/dL (ref 8.9–10.3)
CALCIUM: 8.8 mg/dL — AB (ref 8.9–10.3)
CALCIUM: 8.7 mg/dL — AB (ref 8.9–10.3)
CHLORIDE: 103 mmol/L (ref 101–111)
CHLORIDE: 103 mmol/L (ref 101–111)
CO2: 27 mmol/L (ref 22–32)
CO2: 27 mmol/L (ref 22–32)
CO2: 27 mmol/L (ref 22–32)
CO2: 27 mmol/L (ref 22–32)
CREATININE: 1.13 mg/dL — AB (ref 0.44–1.00)
Calcium: 8.7 mg/dL — ABNORMAL LOW (ref 8.9–10.3)
Chloride: 103 mmol/L (ref 101–111)
Chloride: 104 mmol/L (ref 101–111)
Creatinine, Ser: 1.18 mg/dL — ABNORMAL HIGH (ref 0.44–1.00)
Creatinine, Ser: 1.16 mg/dL — ABNORMAL HIGH (ref 0.44–1.00)
Creatinine, Ser: 1.11 mg/dL — ABNORMAL HIGH (ref 0.44–1.00)
GFR calc Af Amer: >60 mL/min (ref >60)
GFR calc non Af Amer: 56 mL/min — ABNORMAL LOW (ref >60)
GFR, EST NON AFRICAN AMERICAN: 52 mL/min — AB (ref >60)
GFR, EST NON AFRICAN AMERICAN: 54 mL/min — AB (ref >60)
GFR, EST NON AFRICAN AMERICAN: 55 mL/min — AB (ref >60)
GLUCOSE: 162 mg/dL — AB (ref 65–99)
GLUCOSE: 153 mg/dL — AB (ref 65–99)
Glucose, Bld: 116 mg/dL — ABNORMAL HIGH (ref 65–99)
Glucose, Bld: 189 mg/dL — ABNORMAL HIGH (ref 65–99)
POTASSIUM: 3.2 mmol/L — AB (ref 3.5–5.1)
POTASSIUM: 3.3 mmol/L — AB (ref 3.5–5.1)
POTASSIUM: 3.6 mmol/L (ref 3.5–5.1)
Potassium: 3.1 mmol/L — ABNORMAL LOW (ref 3.5–5.1)
SODIUM: 140 mmol/L (ref 135–145)
SODIUM: 142 mmol/L (ref 135–145)
SODIUM: 141 mmol/L (ref 135–145)
SODIUM: 140 mmol/L (ref 135–145)

## 2015-05-26 LAB — CBC WITH DIFFERENTIAL/PLATELET
Basophils Absolute: 0 10*3/uL (ref 0.0–0.1)
Basophils Relative: 0 %
EOS ABS: 0 10*3/uL (ref 0.0–0.7)
EOS PCT: 0 %
HCT: 31.3 % — ABNORMAL LOW (ref 36.0–46.0)
Hemoglobin: 9.9 g/dL — ABNORMAL LOW (ref 12.0–15.0)
LYMPHS ABS: 2.9 10*3/uL (ref 0.7–4.0)
Lymphocytes Relative: 22 %
MCH: 29.2 pg (ref 26.0–34.0)
MCHC: 31.6 g/dL (ref 30.0–36.0)
MCV: 92.3 fL (ref 78.0–100.0)
MONO ABS: 1.1 10*3/uL — AB (ref 0.1–1.0)
MONOS PCT: 9 %
Neutro Abs: 9.1 10*3/uL — ABNORMAL HIGH (ref 1.7–7.7)
Neutrophils Relative %: 69 %
PLATELETS: 317 10*3/uL (ref 150–400)
RBC: 3.39 MIL/uL — ABNORMAL LOW (ref 3.87–5.11)
RDW: 14.7 % (ref 11.5–15.5)
WBC: 13.2 10*3/uL — AB (ref 4.0–10.5)

## 2015-05-26 LAB — MAGNESIUM: Magnesium: 1.5 mg/dL — ABNORMAL LOW (ref 1.7–2.4)

## 2015-05-26 LAB — URINALYSIS, ROUTINE W REFLEX MICROSCOPIC
BILIRUBIN URINE: NEGATIVE
Glucose, UA: >1000 mg/dL — AB
KETONES UR: NEGATIVE mg/dL
NITRITE: NEGATIVE
PROTEIN: 100 mg/dL — AB
Specific Gravity, Urine: 1.027 (ref 1.005–1.030)
pH: 5.5 (ref 5.0–8.0)

## 2015-05-26 LAB — GLUCOSE, CAPILLARY
GLUCOSE-CAPILLARY: 105 mg/dL — AB (ref 65–99)
GLUCOSE-CAPILLARY: 128 mg/dL — AB (ref 65–99)
GLUCOSE-CAPILLARY: 151 mg/dL — AB (ref 65–99)
GLUCOSE-CAPILLARY: 154 mg/dL — AB (ref 65–99)
GLUCOSE-CAPILLARY: 159 mg/dL — AB (ref 65–99)
GLUCOSE-CAPILLARY: 172 mg/dL — AB (ref 65–99)
GLUCOSE-CAPILLARY: 180 mg/dL — AB (ref 65–99)
GLUCOSE-CAPILLARY: 190 mg/dL — AB (ref 65–99)
GLUCOSE-CAPILLARY: 192 mg/dL — AB (ref 65–99)
Glucose-Capillary: 122 mg/dL — ABNORMAL HIGH (ref 65–99)
Glucose-Capillary: 182 mg/dL — ABNORMAL HIGH (ref 65–99)

## 2015-05-26 LAB — URINE MICROSCOPIC-ADD ON

## 2015-05-26 LAB — TSH: TSH: 0.863 u[IU]/mL (ref 0.350–4.500)

## 2015-05-26 LAB — MRSA PCR SCREENING: MRSA BY PCR: NEGATIVE

## 2015-05-26 MED ORDER — INSULIN ASPART PROT & ASPART (70-30 MIX) 100 UNIT/ML ~~LOC~~ SUSP
50.0000 [IU] | Freq: Every day | SUBCUTANEOUS | Status: DC
  Administered 2015-05-26 – 2015-05-29 (×4): 50 [IU] via SUBCUTANEOUS
  Filled 2015-05-26: qty 10

## 2015-05-26 MED ORDER — POTASSIUM CHLORIDE CRYS ER 20 MEQ PO TBCR
40.0000 meq | EXTENDED_RELEASE_TABLET | Freq: Two times a day (BID) | ORAL | Status: DC
  Administered 2015-05-26 – 2015-05-28 (×4): 40 meq via ORAL
  Filled 2015-05-26 (×4): qty 2

## 2015-05-26 MED ORDER — IOHEXOL 300 MG/ML  SOLN
25.0000 mL | INTRAMUSCULAR | Status: AC
  Administered 2015-05-26 (×2): 25 mL via ORAL

## 2015-05-26 MED ORDER — POTASSIUM CHLORIDE IN NACL 20-0.9 MEQ/L-% IV SOLN
INTRAVENOUS | Status: DC
  Administered 2015-05-26 – 2015-05-28 (×3): via INTRAVENOUS
  Filled 2015-05-26 (×4): qty 1000

## 2015-05-26 MED ORDER — LEVOFLOXACIN IN D5W 750 MG/150ML IV SOLN
750.0000 mg | INTRAVENOUS | Status: DC
  Administered 2015-05-26 – 2015-05-28 (×3): 750 mg via INTRAVENOUS
  Filled 2015-05-26 (×3): qty 150

## 2015-05-26 MED ORDER — INSULIN ASPART PROT & ASPART (70-30 MIX) 100 UNIT/ML ~~LOC~~ SUSP: 50.0000 [IU] | Freq: Every day | SUBCUTANEOUS | Status: DC

## 2015-05-26 MED ORDER — HYDROMORPHONE HCL 1 MG/ML IJ SOLN
0.5000 mg | INTRAMUSCULAR | Status: DC | PRN
  Administered 2015-05-26 (×3): 0.5 mg via INTRAVENOUS
  Administered 2015-05-27: 1 mg via INTRAVENOUS
  Administered 2015-05-27 – 2015-05-28 (×5): 0.5 mg via INTRAVENOUS
  Filled 2015-05-26 (×9): qty 1

## 2015-05-26 MED ORDER — POTASSIUM CHLORIDE 10 MEQ/100ML IV SOLN
10.0000 meq | INTRAVENOUS | Status: AC
  Administered 2015-05-26 (×2): 10 meq via INTRAVENOUS
  Filled 2015-05-26 (×2): qty 100

## 2015-05-26 MED ORDER — POTASSIUM CHLORIDE 20 MEQ/15ML (10%) PO SOLN
40.0000 meq | Freq: Once | ORAL | Status: DC
  Filled 2015-05-26: qty 30

## 2015-05-26 MED ORDER — INSULIN ASPART PROT & ASPART (70-30 MIX) 100 UNIT/ML ~~LOC~~ SUSP
25.0000 [IU] | Freq: Every day | SUBCUTANEOUS | Status: DC
  Administered 2015-05-26 – 2015-05-29 (×4): 25 [IU] via SUBCUTANEOUS
  Filled 2015-05-26: qty 10

## 2015-05-26 MED ORDER — IOHEXOL 300 MG/ML  SOLN
100.0000 mL | Freq: Once | INTRAMUSCULAR | Status: AC | PRN
  Administered 2015-05-26: 100 mL via INTRAVENOUS

## 2015-05-26 MED ORDER — SODIUM CHLORIDE 0.9 % IV SOLN
INTRAVENOUS | Status: DC
  Administered 2015-05-26: 03:00:00 via INTRAVENOUS

## 2015-05-26 MED ORDER — METOCLOPRAMIDE HCL 5 MG/ML IJ SOLN: 5.0000 mg | INTRAMUSCULAR | Status: DC | PRN

## 2015-05-26 MED ORDER — CETYLPYRIDINIUM CHLORIDE 0.05 % MT LIQD
7.0000 mL | Freq: Two times a day (BID) | OROMUCOSAL | Status: DC
  Administered 2015-05-26 – 2015-05-30 (×10): 7 mL via OROMUCOSAL

## 2015-05-26 MED ORDER — PANTOPRAZOLE SODIUM 40 MG PO TBEC
40.0000 mg | DELAYED_RELEASE_TABLET | Freq: Two times a day (BID) | ORAL | Status: DC
  Administered 2015-05-26 – 2015-05-31 (×11): 40 mg via ORAL
  Filled 2015-05-26 (×12): qty 1

## 2015-05-26 MED ORDER — ONDANSETRON HCL 4 MG/2ML IJ SOLN
4.0000 mg | Freq: Four times a day (QID) | INTRAMUSCULAR | Status: DC | PRN
  Administered 2015-05-26 (×2): 4 mg via INTRAVENOUS
  Filled 2015-05-26 (×2): qty 2

## 2015-05-26 NOTE — Progress Notes (Signed)
Called [facility] to clarify orders for 70/30 insulin to be given at 0245 as a transition from insulin drip per DKA protocol. Midlevel confirmed orders, 70/30 given, will continue to monitor.

## 2015-05-26 NOTE — Progress Notes (Addendum)
[facility] PROGRESS NOTE  Deborah [name] [id number] DOB: [birth year] DOA: 05/25/2015 PCP: Deborah Haber, MD  Summary 05/26/15: I have seen and examined Deborah [name] at bedside and reviewed her chart in the presence of her husband. Deborah [name] is a pleasant 50 year old female diabetes mellitus type II on insulin, depression, essential hypertension, dyslipidemia, bleeding peptic ulcer status post EGD at [facility] last month(also treated for pneumonia then) who presented to [facility] ED with reports of ongoing nausea and vomiting associated with chest pain and shortness of breath. She was found to be hyperglycemic with concern for DKA although her bicarbonate was 24/anion gap 19, sugars greater than 300 mg/dl. It appears that she had anion gap metabolic acidosis probably related to ongoing infection/GI fluid loss related to vomiting-?gastroparesis. Her white count was 13,000, UA pending/CXR unrevealing. She continues to have nausea and cough. She also admits to subjective fever and chills. Sugars are better controlled. We'll therefore transition patient to her regular regimen of insulin, pursue septic workup including UA/urine culture/CT chest abdomen and pelvis, give PPI/discontinue aspirin, cover with Levaquin pending septic workup, and transfer to [place] when bed becomes available. Plan DKA (diabetic ketoacidoses) (HCC)/Uncontrolled type 2 diabetes mellitus with diabetic neuropathy, with long-term current use of insulin ([place])  Discontinue DKA protocol  Resume home insulin regimen  Check hemoglobin A1c  IV fluids Leukocytosis/Nausea with vomiting/Abdominal pain/hypokalemia/[name]    UA/urine culture/blood culture  CT chest abdomen and pelvis  Levaquin/Reglan, discontinue aspirin  Replenish electrolytes as necessary Essential hypertension, benign/Dyslipidemia associated with type 2 diabetes mellitus (HCC)/CKD (chronic kidney disease) stage 3, GFR 30-59  ml/min  No acute changes  Continue home medications    Code Status: Full code Family Communication: Husband at bedside Disposition Plan: Transfer to [place] today, will eventually discharge home in the next 48 hours or so depending on clinical improvement.   Consultants:  None  Procedures:  None  Antibiotics:  Levaquin 05/26/2015>>  HPI/Subjective: Complains of nausea and cough. Feels like food gets stuck the throat.  Objective: Filed Vitals:   05/26/15 0800 05/26/15 0952  BP:  137/111  Pulse:  105  Temp: 98.1 F (36.7 C)   Resp:      Intake/Output Summary (Last 24 hours) at 05/26/15 0956 Last data filed at 05/26/15 0700  Gross per 24 hour  Intake 821.23 ml  Output      0 ml  Net 821.23 ml   Filed Weights   05/25/15 2200  Weight: 142.2 kg (313 lb 7.9 oz)    Exam:   General:  Comfortable at rest.  Cardiovascular: S1-S2 normal. No murmurs. Pulse regular.  Respiratory: Good air entry bilaterally. No rhonchi or rales.  Abdomen: Soft and nontender. Normal bowel sounds. No organomegaly.  Musculoskeletal: No pedal edema   Neurological: Intact  Data Reviewed: Basic Metabolic Panel:  Recent Labs Lab 05/25/15 1301 05/25/15 2158 05/26/15 0136 05/26/15 0553 05/26/15 0819  NA 136 139 140 142 141  K 3.7 3.3* 3.2* 3.3* 3.1*  CL 93* 100* 103 104 103  CO2 24 27 27 27 27   GLUCOSE 430* 214* 162* 153* 116*  BUN 15 16 16 16 16   CREATININE 1.12* 1.25* 1.18* 1.16* 1.13*  CALCIUM 9.9 9.2 9.0 8.8* 8.7*   Liver Function Tests: No results for input(s): AST, ALT, ALKPHOS, BILITOT, PROT, ALBUMIN in the last 168 hours. No results for input(s): LIPASE, AMYLASE in the last 168 hours. No results for input(s): AMMONIA in the last 168 hours. CBC:  Recent Labs Lab 05/25/15  1301 05/26/15 0135  WBC 13.2* 13.2*  NEUTROABS  --  9.1*  HGB 11.8* 9.9*  HCT 36.4 31.3*  MCV 90.3 92.3  PLT 345 317   Cardiac Enzymes: No results for input(s): CKTOTAL, CKMB,  CKMBINDEX, TROPONINI in the last 168 hours. BNP (last 3 results)  Recent Labs  08/06/14 1318  BNP 17.9    ProBNP (last 3 results) No results for input(s): PROBNP in the last 8760 hours.  CBG:  Recent Labs Lab 05/26/15 0017 05/26/15 0118 05/26/15 0257 05/26/15 0354 05/26/15 0502  GLUCAP 180* 159* 128* 122* 154*    Recent Results (from the past 240 hour(s))  MRSA PCR Screening     Status: None   Collection Time: 05/25/15  9:46 PM  Result Value Ref Range Status   MRSA by PCR NEGATIVE NEGATIVE Final    Comment:        The GeneXpert MRSA Assay (FDA approved for NASAL specimens only), is one component of a comprehensive MRSA colonization surveillance program. It is not intended to diagnose MRSA infection nor to guide or monitor treatment for MRSA infections.      Studies: Dg Chest 2 View  05/25/2015  CLINICAL DATA:  CP, SOB and vomiting x 2 days. Hx of DM and HTN. EXAM: CHEST  2 VIEW COMPARISON:  03/14/2015 FINDINGS: The heart size and mediastinal contours are within normal limits. Both lungs are clear. The visualized skeletal structures are unremarkable. IMPRESSION: No active cardiopulmonary disease. Electronically Signed   By: Nolon Nations M.D.   On: 05/25/2015 13:40    Scheduled Meds: . allopurinol  100 mg Oral Daily  . amLODipine  10 mg Oral Daily  . antiseptic oral rinse  7 mL Mouth Rinse BID  . DULoxetine  60 mg Oral Daily  . gabapentin  300 mg Oral TID  . insulin aspart protamine- aspart  25 Units Subcutaneous Q lunch  . insulin aspart protamine- aspart  50 Units Subcutaneous Q supper  . insulin aspart protamine- aspart  50 Units Subcutaneous Q breakfast  . levofloxacin (LEVAQUIN) IV  750 mg Intravenous Q24H  . metoprolol succinate  25 mg Oral Daily  . multivitamin with minerals  1 tablet Oral Daily  . pantoprazole  40 mg Oral BID  . potassium chloride  40 mEq Oral Once  . pravastatin  10 mg Oral q1800   Continuous Infusions: . 0.9 % NaCl with KCl  20 mEq / L       Time spent: 25 minutes    Deborah [name]  [facility] Pager [phone]. If 7PM-7AM, please contact night-coverage at [email or web address], password [facility] 05/26/2015, 9:56 AM  LOS: 1 day

## 2015-05-26 NOTE — Progress Notes (Addendum)
Inpatient Diabetes Program Recommendations  AACE/ADA: New Consensus Statement on Inpatient Glycemic Control (2015)  Target Ranges:  Prepandial:   less than 140 mg/dL      Peak postprandial:   less than 180 mg/dL (1-2 hours)      Critically ill patients:  140 - 180 mg/dL   Results for Deborah [name], Deborah [name] (MRN [id number]) as of 05/26/2015 08:46  Ref. Range 05/25/2015 12:47 05/25/2015 14:27 05/25/2015 15:32 05/25/2015 16:37 05/25/2015 17:43 05/25/2015 18:47 05/25/2015 19:52 05/25/2015 20:57 05/25/2015 22:00 05/25/2015 23:12 05/26/2015 00:17 05/26/2015 01:18 05/26/2015 02:57 05/26/2015 03:54 05/26/2015 05:02  Glucose-Capillary Latest Ref Range: 65-99 mg/dL 383 (H) 402 (H) 361 (H) 335 (H) 273 (H) 254 (H) 259 (H) 222 (H) 190 (H) 192 (H) 180 (H) 159 (H) 128 (H) 122 (H) 154 (H)    Admit with: DKA  History: DM, CKD  Home DM Meds: Novolog 70/30 Insulin: 75 units with breakfast/ 50 units with lunch/ 75 units with dinner  Current Insulin Orders: Novolog 70/30 Insulin: 50 units with breakfast/ 25 units with lunch/ 50 units with dinner      MD- Please consider starting Novolog Moderate Correction Scale/ SSI (0-15 units) TID AC + HS     --Will follow patient during hospitalization--  Wyn Quaker RN, MSN, CDE Diabetes Coordinator Inpatient Glycemic Control Team Team Pager: [phone] (8a-5p)

## 2015-05-27 DIAGNOSIS — M109 Gout, unspecified: Secondary | ICD-10-CM | POA: Diagnosis present

## 2015-05-27 LAB — GLUCOSE, CAPILLARY
Glucose-Capillary: 157 mg/dL — ABNORMAL HIGH (ref 65–99)
Glucose-Capillary: 142 mg/dL — ABNORMAL HIGH (ref 65–99)

## 2015-05-27 LAB — MAGNESIUM: MAGNESIUM: 1.5 mg/dL — AB (ref 1.7–2.4)

## 2015-05-27 LAB — CBC
HEMATOCRIT: 31.4 % — AB (ref 36.0–46.0)
HEMOGLOBIN: 9.9 g/dL — AB (ref 12.0–15.0)
MCH: 29.7 pg (ref 26.0–34.0)
MCHC: 31.5 g/dL (ref 30.0–36.0)
MCV: 94.3 fL (ref 78.0–100.0)
Platelets: 300 10*3/uL (ref 150–400)
RBC: 3.33 MIL/uL — AB (ref 3.87–5.11)
RDW: 14.6 % (ref 11.5–15.5)
WBC: 10.2 10*3/uL (ref 4.0–10.5)

## 2015-05-27 LAB — BASIC METABOLIC PANEL
Anion gap: 10 (ref 5–15)
BUN: 15 mg/dL (ref 6–20)
CHLORIDE: 106 mmol/L (ref 101–111)
CO2: 25 mmol/L (ref 22–32)
CREATININE: 1.18 mg/dL — AB (ref 0.44–1.00)
Calcium: 8.5 mg/dL — ABNORMAL LOW (ref 8.9–10.3)
GFR calc non Af Amer: 52 mL/min — ABNORMAL LOW (ref >60)
Glucose, Bld: 143 mg/dL — ABNORMAL HIGH (ref 65–99)
POTASSIUM: 3.8 mmol/L (ref 3.5–5.1)
Sodium: 141 mmol/L (ref 135–145)

## 2015-05-27 LAB — URINE CULTURE

## 2015-05-27 LAB — PHOSPHORUS: PHOSPHORUS: 2.7 mg/dL (ref 2.5–4.6)

## 2015-05-27 LAB — URIC ACID: URIC ACID, SERUM: 9.7 mg/dL — AB (ref 2.3–6.6)

## 2015-05-27 MED ORDER — IBUPROFEN 200 MG PO TABS
600.0000 mg | ORAL_TABLET | Freq: Once | ORAL | Status: AC
  Administered 2015-05-27: 600 mg via ORAL
  Filled 2015-05-27: qty 3

## 2015-05-27 MED ORDER — COLCHICINE 0.6 MG PO TABS
0.6000 mg | ORAL_TABLET | Freq: Every day | ORAL | Status: DC
  Administered 2015-05-27 – 2015-05-31 (×5): 0.6 mg via ORAL
  Filled 2015-05-27 (×8): qty 1

## 2015-05-27 NOTE — Progress Notes (Signed)
[facility] PROGRESS NOTE  MASAE COMI [id number] DOB: 1965-05-26 DOA: 05/25/2015 PCP: Ernest Haber, MD  Summary Deborah [name] is a pleasant 50 year old female with diabetes mellitus type II on insulin, depression, essential hypertension, dyslipidemia, bleeding peptic ulcer status post EGD at [facility] last month(also treated for pneumonia then), who presented to [facility] ED with reports of ongoing nausea and vomiting associated with chest pain and shortness of breath. She was found to be hyperglycemic with concern for DKA although her bicarbonate was 24/anion gap 19 and sugars greater than 300 mg/dl. It appears that she had anion gap metabolic acidosis probably related to ongoing infection(uti)/GI fluid loss related to vomiting-?gastroparesis. Her white count was 13,000, and UA suggests Uti. CT chest abdomen and pelvis is unrevealing. Her white count has improved to 10,200 today. Sugars better controlled, hemoglobin A1c pending. Patient feels like she has a gout flare involving her feet and ankles has she is in severe pain when she tries to walk this morning. There is no obvious swelling of the joints. Will continue Levaquin pending urine culture results, follow hemoglobin A1c, give dose of Motrin, start colchicine, continue allopurinol, continue gentle fluids, and continue current insulin regimen. Patient will be hopefully home in the next day or 2 if she continues to do well. Plan DKA (diabetic ketoacidoses) (HCC)/Uncontrolled type 2 diabetes mellitus with diabetic neuropathy, with long-term current use of insulin (HCC)  Sugars better controlled  Continue home insulin regimen  Follow hemoglobin A1c  IV fluids Leukocytosis/Nausea with vomiting/Abdominal pain/hypokalemia/[name]/Uti   Follow urine culture/blood culture  Continue Levaquin/Reglan as needed  Replenish electrolytes as necessary Essential hypertension, benign/Dyslipidemia associated with type 2  diabetes mellitus (HCC)/CKD (chronic kidney disease) stage 3, GFR 30-59 ml/min  No acute changes  Continue home medications Gout flare up  Check uric acid level  Give dose of Motrin  Colchicine/allopurinol  Code Status: Full code Family Communication: Husband at bedside on 05/26/2015 Disposition Plan: Hopefully home in the next 24-48 hours if patient continues to do well-follow urine culture.   Consultants:  None  Procedures:  None  Antibiotics:  Levaquin 05/26/2015>>  HPI/Subjective: Complains of feet pain. Says she had some nausea this morning.  Objective: Filed Vitals:   05/27/15 0152 05/27/15 0605  BP: 124/61 122/69  Pulse: 94 89  Temp: 97.8 F (36.6 C) 97.8 F (36.6 C)  Resp: 17 17    Intake/Output Summary (Last 24 hours) at 05/27/15 0938 Last data filed at 05/26/15 1500  Gross per 24 hour  Intake 526.25 ml  Output    550 ml  Net -23.75 ml   Filed Weights   05/25/15 2200 05/26/15 1606  Weight: 142.2 kg (313 lb 7.9 oz) 145.151 kg (320 lb)    Exam:   General:  Comfortable at rest.  Cardiovascular: S1-S2 normal. No murmurs. Pulse regular.  Respiratory: Good air entry bilaterally. No rhonchi or rales.  Abdomen: Soft and nontender. Normal bowel sounds. No organomegaly.  Musculoskeletal: No pedal edema   Neurological: Intact  Data Reviewed: Basic Metabolic Panel:  Recent Labs Lab 05/26/15 0136 05/26/15 0553 05/26/15 0819 05/26/15 1345 05/27/15 0401  NA 140 142 141 140 141  K 3.2* 3.3* 3.1* 3.6 3.8  CL 103 104 103 103 106  CO2 27 27 27 27 25   GLUCOSE 162* 153* 116* 189* 143*  BUN 16 16 16 15 15   CREATININE 1.18* 1.16* 1.13* 1.11* 1.18*  CALCIUM 9.0 8.8* 8.7* 8.7* 8.5*  MG  --   --  1.5*  --  1.5*  PHOS  --   --   --   --  2.7   Liver Function Tests: No results for input(s): AST, ALT, ALKPHOS, BILITOT, PROT, ALBUMIN in the last 168 hours. No results for input(s): LIPASE, AMYLASE in the last 168 hours. No results for  input(s): AMMONIA in the last 168 hours. CBC:  Recent Labs Lab 05/25/15 1301 05/26/15 0135 05/27/15 0401  WBC 13.2* 13.2* 10.2  NEUTROABS  --  9.1*  --   HGB 11.8* 9.9* 9.9*  HCT 36.4 31.3* 31.4*  MCV 90.3 92.3 94.3  PLT 345 317 300   Cardiac Enzymes: No results for input(s): CKTOTAL, CKMB, CKMBINDEX, TROPONINI in the last 168 hours. BNP (last 3 results)  Recent Labs  08/06/14 1318  BNP 17.9    ProBNP (last 3 results) No results for input(s): PROBNP in the last 8760 hours.  CBG:  Recent Labs Lab 05/26/15 0827 05/26/15 1203 05/26/15 1715 05/26/15 2151 05/27/15 0741  GLUCAP 105* 182* 151* 172* 157*    Recent Results (from the past 240 hour(s))  MRSA PCR Screening     Status: None   Collection Time: 05/25/15  9:46 PM  Result Value Ref Range Status   MRSA by PCR NEGATIVE NEGATIVE Final    Comment:        The GeneXpert MRSA Assay (FDA approved for NASAL specimens only), is one component of a comprehensive MRSA colonization surveillance program. It is not intended to diagnose MRSA infection nor to guide or monitor treatment for MRSA infections.      Studies: Dg Chest 2 View  05/25/2015  CLINICAL DATA:  CP, SOB and vomiting x 2 days. Hx of DM and HTN. EXAM: CHEST  2 VIEW COMPARISON:  03/14/2015 FINDINGS: The heart size and mediastinal contours are within normal limits. Both lungs are clear. The visualized skeletal structures are unremarkable. IMPRESSION: No active cardiopulmonary disease. Electronically Signed   By: Nolon Nations M.D.   On: 05/25/2015 13:40   Ct Chest W Contrast  05/26/2015  CLINICAL DATA:  Nausea and vomiting with chest pain EXAM: CT CHEST, ABDOMEN, AND PELVIS WITH CONTRAST TECHNIQUE: Multidetector CT imaging of the chest, abdomen and pelvis was performed following the standard protocol during bolus administration of intravenous contrast. CONTRAST:  150mL OMNIPAQUE IOHEXOL 300 MG/ML  SOLN COMPARISON:  None. FINDINGS: CT CHEST FINDINGS  Mediastinum/Lymph Nodes: No significant lymphadenopathy is noted. The thoracic aorta and pulmonary artery as visualized are within normal limits. The thoracic inlet is unremarkable. Lungs/Pleura: The lungs are well aerated bilaterally without focal infiltrate or sizable effusion. Mild dependent atelectatic changes are seen. Musculoskeletal: No chest wall mass or suspicious bone lesions identified. CT ABDOMEN PELVIS FINDINGS Hepatobiliary: Fatty infiltration of the liver is noted. The gallbladder is within normal limits. Pancreas: No mass, inflammatory changes, or other significant abnormality. Spleen: Within normal limits in size and appearance. Adrenals/Urinary Tract: No masses identified. No evidence of hydronephrosis. Bladder is partially decompressed. Stomach/Bowel: No evidence of obstruction, inflammatory process, or abnormal fluid collections. The appendix is not well seen although no inflammatory changes are noted. Vascular/Lymphatic: No pathologically enlarged lymph nodes. No evidence of abdominal aortic aneurysm. Reproductive: No mass or other significant abnormality. Other: None. Musculoskeletal:  Degenerative changes of lumbar spine are noted. IMPRESSION: Chronic changes as described above. No acute abnormality is noted in the chest, abdomen and pelvis Electronically Signed   By: Inez Catalina M.D.   On: 05/26/2015 16:01   Ct Abdomen Pelvis W Contrast  05/26/2015  CLINICAL  DATA:  Nausea and vomiting with chest pain EXAM: CT CHEST, ABDOMEN, AND PELVIS WITH CONTRAST TECHNIQUE: Multidetector CT imaging of the chest, abdomen and pelvis was performed following the standard protocol during bolus administration of intravenous contrast. CONTRAST:  170mL OMNIPAQUE IOHEXOL 300 MG/ML  SOLN COMPARISON:  None. FINDINGS: CT CHEST FINDINGS Mediastinum/Lymph Nodes: No significant lymphadenopathy is noted. The thoracic aorta and pulmonary artery as visualized are within normal limits. The thoracic inlet is unremarkable.  Lungs/Pleura: The lungs are well aerated bilaterally without focal infiltrate or sizable effusion. Mild dependent atelectatic changes are seen. Musculoskeletal: No chest wall mass or suspicious bone lesions identified. CT ABDOMEN PELVIS FINDINGS Hepatobiliary: Fatty infiltration of the liver is noted. The gallbladder is within normal limits. Pancreas: No mass, inflammatory changes, or other significant abnormality. Spleen: Within normal limits in size and appearance. Adrenals/Urinary Tract: No masses identified. No evidence of hydronephrosis. Bladder is partially decompressed. Stomach/Bowel: No evidence of obstruction, inflammatory process, or abnormal fluid collections. The appendix is not well seen although no inflammatory changes are noted. Vascular/Lymphatic: No pathologically enlarged lymph nodes. No evidence of abdominal aortic aneurysm. Reproductive: No mass or other significant abnormality. Other: None. Musculoskeletal:  Degenerative changes of lumbar spine are noted. IMPRESSION: Chronic changes as described above. No acute abnormality is noted in the chest, abdomen and pelvis Electronically Signed   By: Inez Catalina M.D.   On: 05/26/2015 16:01    Scheduled Meds: . allopurinol  100 mg Oral Daily  . amLODipine  10 mg Oral Daily  . antiseptic oral rinse  7 mL Mouth Rinse BID  . colchicine  0.6 mg Oral Daily  . DULoxetine  60 mg Oral Daily  . gabapentin  300 mg Oral TID  . ibuprofen  600 mg Oral Once  . insulin aspart protamine- aspart  25 Units Subcutaneous Q lunch  . insulin aspart protamine- aspart  50 Units Subcutaneous Q supper  . insulin aspart protamine- aspart  50 Units Subcutaneous Q breakfast  . levofloxacin (LEVAQUIN) IV  750 mg Intravenous Q24H  . metoprolol succinate  25 mg Oral Daily  . multivitamin with minerals  1 tablet Oral Daily  . pantoprazole  40 mg Oral BID  . potassium chloride  40 mEq Oral Once  . potassium chloride  40 mEq Oral BID  . pravastatin  10 mg Oral q1800    Continuous Infusions: . 0.9 % NaCl with KCl 20 mEq / L 75 mL/hr at 05/27/15 0117     Time spent: 20 minutes    Kajol Crispen  [facility] Pager [phone]. If 7PM-7AM, please contact night-coverage at [email or web address], password [facility] 05/27/2015, 9:38 AM  LOS: 2 days

## 2015-05-28 DIAGNOSIS — I10 Essential (primary) hypertension: Secondary | ICD-10-CM

## 2015-05-28 DIAGNOSIS — E1310 Other specified diabetes mellitus with ketoacidosis without coma: Secondary | ICD-10-CM

## 2015-05-28 DIAGNOSIS — E1169 Type 2 diabetes mellitus with other specified complication: Secondary | ICD-10-CM

## 2015-05-28 DIAGNOSIS — M109 Gout, unspecified: Secondary | ICD-10-CM

## 2015-05-28 DIAGNOSIS — E785 Hyperlipidemia, unspecified: Secondary | ICD-10-CM

## 2015-05-28 LAB — GLUCOSE, CAPILLARY
GLUCOSE-CAPILLARY: 189 mg/dL — AB (ref 65–99)
GLUCOSE-CAPILLARY: 181 mg/dL — AB (ref 65–99)
Glucose-Capillary: 165 mg/dL — ABNORMAL HIGH (ref 65–99)
Glucose-Capillary: 101 mg/dL — ABNORMAL HIGH (ref 65–99)
Glucose-Capillary: 159 mg/dL — ABNORMAL HIGH (ref 65–99)

## 2015-05-28 LAB — COMPREHENSIVE METABOLIC PANEL
ALK PHOS: 79 U/L (ref 38–126)
ALT: 17 U/L (ref 14–54)
AST: 20 U/L (ref 15–41)
Albumin: 3.3 g/dL — ABNORMAL LOW (ref 3.5–5.0)
Anion gap: 9 (ref 5–15)
BUN: 17 mg/dL (ref 6–20)
CALCIUM: 8.7 mg/dL — AB (ref 8.9–10.3)
CHLORIDE: 107 mmol/L (ref 101–111)
CO2: 23 mmol/L (ref 22–32)
CREATININE: 1.18 mg/dL — AB (ref 0.44–1.00)
GFR, EST NON AFRICAN AMERICAN: 52 mL/min — AB (ref >60)
Glucose, Bld: 167 mg/dL — ABNORMAL HIGH (ref 65–99)
Potassium: 4.1 mmol/L (ref 3.5–5.1)
Sodium: 139 mmol/L (ref 135–145)
TOTAL PROTEIN: 7.4 g/dL (ref 6.5–8.1)
Total Bilirubin: 0.6 mg/dL (ref 0.3–1.2)

## 2015-05-28 LAB — PHOSPHORUS: PHOSPHORUS: 3.2 mg/dL (ref 2.5–4.6)

## 2015-05-28 LAB — CBC
HCT: 33.2 % — ABNORMAL LOW (ref 36.0–46.0)
Hemoglobin: 10.4 g/dL — ABNORMAL LOW (ref 12.0–15.0)
MCH: 29.7 pg (ref 26.0–34.0)
MCHC: 31.3 g/dL (ref 30.0–36.0)
MCV: 94.9 fL (ref 78.0–100.0)
PLATELETS: 311 10*3/uL (ref 150–400)
RBC: 3.5 MIL/uL — AB (ref 3.87–5.11)
RDW: 14.8 % (ref 11.5–15.5)
WBC: 10.1 10*3/uL (ref 4.0–10.5)

## 2015-05-28 LAB — MAGNESIUM: MAGNESIUM: 1.6 mg/dL — AB (ref 1.7–2.4)

## 2015-05-28 LAB — PREGNANCY, URINE: PREG TEST UR: NEGATIVE

## 2015-05-28 LAB — HEMOGLOBIN A1C
Hgb A1c MFr Bld: 9.4 % — ABNORMAL HIGH (ref 4.8–5.6)
Mean Plasma Glucose: 223 mg/dL

## 2015-05-28 MED ORDER — HYDROCODONE-ACETAMINOPHEN 5-325 MG PO TABS
1.0000 | ORAL_TABLET | Freq: Four times a day (QID) | ORAL | Status: DC | PRN
  Administered 2015-05-28: 1 via ORAL
  Administered 2015-05-28 – 2015-05-31 (×9): 2 via ORAL
  Filled 2015-05-28 (×2): qty 2
  Filled 2015-05-28: qty 1
  Filled 2015-05-28 (×7): qty 2

## 2015-05-28 MED ORDER — PREDNISONE 20 MG PO TABS
40.0000 mg | ORAL_TABLET | Freq: Every day | ORAL | Status: DC
  Administered 2015-05-28 – 2015-05-31 (×4): 40 mg via ORAL
  Filled 2015-05-28 (×4): qty 2

## 2015-05-28 MED ORDER — MAGNESIUM SULFATE 2 GM/50ML IV SOLN
2.0000 g | Freq: Once | INTRAVENOUS | Status: AC
  Administered 2015-05-28: 2 g via INTRAVENOUS
  Filled 2015-05-28: qty 50

## 2015-05-28 MED ORDER — LIP MEDEX EX OINT
TOPICAL_OINTMENT | CUTANEOUS | Status: AC
  Administered 2015-05-28: 12:00:00
  Filled 2015-05-28: qty 7

## 2015-05-28 MED ORDER — ACETAMINOPHEN 325 MG PO TABS: 650.0000 mg | ORAL_TABLET | Freq: Four times a day (QID) | ORAL | Status: DC | PRN

## 2015-05-28 MED ORDER — HYDROMORPHONE HCL 1 MG/ML IJ SOLN
0.5000 mg | INTRAMUSCULAR | Status: DC | PRN
  Administered 2015-05-28 – 2015-05-31 (×20): 0.5 mg via INTRAVENOUS
  Filled 2015-05-28 (×20): qty 1

## 2015-05-28 NOTE — Progress Notes (Signed)
PROGRESS NOTE    Deborah [name]  [id number]  DOB: 1965-10-18  DOA: 05/25/2015 PCP: Ernest Haber, MD Outpatient Specialists:   Hospital course: 50 year old female patient with history of DM 2/IDDM, HTN, CVA, gastric ulcer by EGD earlier this year, gout, depression, HLD, presented to ED with complaints of ongoing nausea and vomiting for a few days prior to admission, followed by abdominal abdominal and chest pain related to vomiting. In the ED, blood glucose 430 and the anion gap of 19. Admitted initially for DKA.   Assessment & Plan:   DKA type II, not at goal - Likely precipitated by UTI or nausea and vomiting. - Treated per DKA protocol with IV fluids, insulin drip, close monitoring of BMP. After DKA was resolved, transitioned to lower than home dose of protamine insulin. - A1c: 9.4. - Good inpatient control.  Nausea, vomiting and abdominal pain - May have been due to UTI, acute GE, diabetic gastroparesis. Resolved. - CT chest and abdomen without acute findings.  Presumed UTI/trichomoniasis  - Urine culture shows multiple organisms-likely contaminant. Completed 3 days of levofloxacin. Patient did not have any urinary symptoms on admission and hence will discontinue antibiotics. - Treat for trichomoniasis with a dose of Flagyl. - Blood cultures 2: Negative to date.  Essential hypertension - Controlled on metoprolol and amlodipine.  Hyperlipidemia - Continue statins.  Stage II chronic kidney disease - Stable.  Depression - Stable. Continue Cymbalta.  Gastric ulcer by EGD - Continue PPI. Avoid NSAIDs. - Underwent EGD at [facility] last month.  Acute gouty arthritis - Of left ankle and toes. No significant relief with colchicine. Already on allopurinol. Hesitant to use NSAIDs secondary to gastric ulcer history. Start prednisone 40 mg daily 3 days. Monitor CBGs closely.  Hypokalemia - Replaced.  Hypomagnesemia  - Replace   Anemia - Stable  DVT  prophylaxis: SCDs Code Status: Full Family Communication: Discussed with patient's spouse at bedside on 3/22 Disposition Plan: DC home possibly in the next 24-48 hours   Consultants:  None  Procedures:  None  Antimicrobials:  Levofloxacin 3/20 > 3/22   Subjective: No nausea, vomiting or abdominal pain reported. Denies history of urinary frequency, dysuria or vaginal discharge. States that left ankle and toe pains are severe and she is unable to weight-bear.  Objective: Filed Vitals:   05/28/15 0310 05/28/15 0534 05/28/15 0953 05/28/15 1500  BP: 114/67 127/79 121/72 118/67  Pulse: 94 85 93 95  Temp: 97.8 F (36.6 C) 98.2 F (36.8 C) 97.5 F (36.4 C) 97.9 F (36.6 C)  TempSrc: Oral Oral Oral Oral  Resp: 16 18 18 20   Height:      Weight:      SpO2: 99% 100% 97% 95%    Intake/Output Summary (Last 24 hours) at 05/28/15 1717 Last data filed at 05/28/15 0953  Gross per 24 hour  Intake   3795 ml  Output    300 ml  Net   3495 ml   Filed Weights   05/25/15 2200 05/26/15 1606  Weight: 142.2 kg (313 lb 7.9 oz) 145.151 kg (320 lb)    Exam:  General exam: Moderately built and morbidly obese pleasant middle-aged female sitting up comfortably at edge of bed. Respiratory system: Clear. No increased work of breathing. Cardiovascular system: S1 & S2 heard, RRR. No JVD, murmurs, gallops, clicks or pedal edema. Gastrointestinal system: Abdomen is nondistended, soft and nontender. Normal bowel sounds heard. Central nervous system: Alert and oriented. No focal neurological deficits. Extremities: Symmetric 5 x  5 power. Left ankle and toes with mild swelling, increased warmth, tenderness and painful range of motion movements.   Data Reviewed: Basic Metabolic Panel:  Recent Labs Lab 05/26/15 0553 05/26/15 0819 05/26/15 1345 05/27/15 0401 05/28/15 0337  NA 142 141 140 141 139  K 3.3* 3.1* 3.6 3.8 4.1  CL 104 103 103 106 107  CO2 27 27 27 25 23   GLUCOSE 153* 116* 189*  143* 167*  BUN 16 16 15 15 17   CREATININE 1.16* 1.13* 1.11* 1.18* 1.18*  CALCIUM 8.8* 8.7* 8.7* 8.5* 8.7*  MG  --  1.5*  --  1.5* 1.6*  PHOS  --   --   --  2.7 3.2   Liver Function Tests:  Recent Labs Lab 05/28/15 0337  AST 20  ALT 17  ALKPHOS 79  BILITOT 0.6  PROT 7.4  ALBUMIN 3.3*   No results for input(s): LIPASE, AMYLASE in the last 168 hours. No results for input(s): AMMONIA in the last 168 hours. CBC:  Recent Labs Lab 05/25/15 1301 05/26/15 0135 05/27/15 0401 05/28/15 0337  WBC 13.2* 13.2* 10.2 10.1  NEUTROABS  --  9.1*  --   --   HGB 11.8* 9.9* 9.9* 10.4*  HCT 36.4 31.3* 31.4* 33.2*  MCV 90.3 92.3 94.3 94.9  PLT 345 317 300 311   Cardiac Enzymes: No results for input(s): CKTOTAL, CKMB, CKMBINDEX, TROPONINI in the last 168 hours. BNP (last 3 results) No results for input(s): PROBNP in the last 8760 hours. CBG:  Recent Labs Lab 05/27/15 1124 05/27/15 1750 05/27/15 2123 05/28/15 0753 05/28/15 1210  GLUCAP 142* 165* 101* 159* 189*    Recent Results (from the past 240 hour(s))  MRSA PCR Screening     Status: None   Collection Time: 05/25/15  9:46 PM  Result Value Ref Range Status   MRSA by PCR NEGATIVE NEGATIVE Final    Comment:        The GeneXpert MRSA Assay (FDA approved for NASAL specimens only), is one component of a comprehensive MRSA colonization surveillance program. It is not intended to diagnose MRSA infection nor to guide or monitor treatment for MRSA infections.   Culture, blood (routine x 2)     Status: None (Preliminary result)   Collection Time: 05/26/15  8:18 AM  Result Value Ref Range Status   Specimen Description BLOOD LEFT HAND  Final   Special Requests IN PEDIATRIC BOTTLE [place]  Final   Culture   Final    NO GROWTH 2 DAYS Performed at [facility]    Report Status PENDING  Incomplete  Culture, blood (routine x 2)     Status: None (Preliminary result)   Collection Time: 05/26/15  8:19 AM  Result Value Ref Range  Status   Specimen Description BLOOD LEFT HAND  Final   Special Requests BOTTLES DRAWN AEROBIC AND ANAEROBIC 5CC  Final   Culture   Final    NO GROWTH 2 DAYS Performed at [facility]    Report Status PENDING  Incomplete  Culture, Urine     Status: None   Collection Time: 05/26/15  2:03 PM  Result Value Ref Range Status   Specimen Description URINE, CLEAN CATCH  Final   Special Requests NONE  Final   Culture   Final    MULTIPLE SPECIES PRESENT, SUGGEST RECOLLECTION Performed at [facility]    Report Status 05/27/2015 FINAL  Final         Studies: No results found.  Scheduled Meds: . allopurinol  100 mg Oral Daily  . amLODipine  10 mg Oral Daily  . antiseptic oral rinse  7 mL Mouth Rinse BID  . colchicine  0.6 mg Oral Daily  . DULoxetine  60 mg Oral Daily  . gabapentin  300 mg Oral TID  . insulin aspart protamine- aspart  25 Units Subcutaneous Q lunch  . insulin aspart protamine- aspart  50 Units Subcutaneous Q supper  . insulin aspart protamine- aspart  50 Units Subcutaneous Q breakfast  . levofloxacin (LEVAQUIN) IV  750 mg Intravenous Q24H  . metoprolol succinate  25 mg Oral Daily  . multivitamin with minerals  1 tablet Oral Daily  . pantoprazole  40 mg Oral BID  . potassium chloride  40 mEq Oral Once  . potassium chloride  40 mEq Oral BID  . pravastatin  10 mg Oral q1800  . predniSONE  40 mg Oral Q breakfast   Continuous Infusions:    Principal Problem:   DKA (diabetic ketoacidoses) (HCC) Active Problems:   Essential hypertension, benign   Uncontrolled type 2 diabetes mellitus with diabetic neuropathy, with long-term current use of insulin (HCC)   Dyslipidemia associated with type 2 diabetes mellitus (HCC)   CKD (chronic kidney disease) stage 3, GFR 30-59 ml/min   Leukocytosis   Nausea with vomiting   Abdominal pain   [name]   UTI (urinary tract infection)   Gout flare    Time spent: 25  minutes.    Vernell Leep, MD, FACP, FHM. [facility] Pager [phone]  If 7PM-7AM, please contact night-coverage [email or web address] Password [facility] 05/28/2015, 5:17 PM    LOS: 3 days

## 2015-05-28 NOTE — Care Management Note (Signed)
Case Management Note  Patient Details  Name: LOTTE COOLING MRN: [id number] Date of Birth: 1965/11/27  Subjective/Objective:       50 yo admitted with DKA             Action/Plan: From home with spouse.  Expected Discharge Date:                  Expected Discharge Plan:  Home/Self Care  In-House Referral:     Discharge planning Services  CM Consult  Post Acute Care Choice:    Choice offered to:     DME Arranged:    DME Agency:     HH Arranged:    HH Agency:     Status of Service:  In process, will continue to follow  Medicare Important Message Given:    Date Medicare IM Given:    Medicare IM give by:    Date Additional Medicare IM Given:    Additional Medicare Important Message give by:     If discussed at [place] of Stay Meetings, dates discussed:    Additional Comments: Chart reviewed and no CM needs identified or communicated at this time. CM will continue to follow. Marney Doctor RN,BSN,NCM [id number]  Lynnell Catalan, RN 05/28/2015, 3:17 PM

## 2015-05-29 LAB — URINALYSIS, ROUTINE W REFLEX MICROSCOPIC
Bilirubin Urine: NEGATIVE
Glucose, UA: >1000 mg/dL — AB
KETONES UR: NEGATIVE mg/dL
LEUKOCYTES UA: NEGATIVE
NITRITE: NEGATIVE
PROTEIN: 30 mg/dL — AB
Specific Gravity, Urine: 1.03 (ref 1.005–1.030)
pH: 5 (ref 5.0–8.0)

## 2015-05-29 LAB — GLUCOSE, CAPILLARY
GLUCOSE-CAPILLARY: 326 mg/dL — AB (ref 65–99)
GLUCOSE-CAPILLARY: 416 mg/dL — AB (ref 65–99)
Glucose-Capillary: 180 mg/dL — ABNORMAL HIGH (ref 65–99)
Glucose-Capillary: 172 mg/dL — ABNORMAL HIGH (ref 65–99)

## 2015-05-29 LAB — URINE MICROSCOPIC-ADD ON

## 2015-05-29 MED ORDER — INSULIN ASPART 100 UNIT/ML ~~LOC~~ SOLN
10.0000 [IU] | Freq: Once | SUBCUTANEOUS | Status: AC
  Administered 2015-05-29: 10 [IU] via SUBCUTANEOUS

## 2015-05-29 MED ORDER — DIPHENHYDRAMINE HCL 25 MG PO CAPS
25.0000 mg | ORAL_CAPSULE | Freq: Four times a day (QID) | ORAL | Status: DC | PRN
  Administered 2015-05-29 – 2015-05-30 (×4): 25 mg via ORAL
  Filled 2015-05-29 (×4): qty 1

## 2015-05-29 NOTE — Plan of Care (Signed)
Problem: Food- and Nutrition-Related Knowledge Deficit (NB-1.1) Goal: Nutrition education Formal process to instruct or train a patient/client in a skill or to impart knowledge to help patients/clients voluntarily manage or modify food choices and eating behavior to maintain or improve health. Outcome: Completed/Met Date Met:  05/29/15  RD consulted via RN for nutrition education regarding diabetes. Patient also requesting information for foods to avoid given gout.    Lab Results  Component Value Date    HGBA1C 9.4* 05/27/2015    RD provided "Carbohydrate Counting for People with Diabetes" handout from the Academy of Nutrition and Dietetics, "MyPlate" handout and "Low Purine Nutrition Therapy" handout from AND. Discussed different food groups and their effects on blood sugar, emphasizing carbohydrate-containing foods. Provided list of carbohydrates and recommended serving sizes of common foods.  Discussed importance of controlled and consistent carbohydrate intake throughout the day. Provided examples of ways to balance meals/snacks and encouraged intake of high-fiber, whole grain complex carbohydrates. Teach back method used.  Expect good compliance.  Body mass index is 51.67 kg/(m^2). Pt meets criteria for morbid obesity based on current BMI.  Current diet order is CHO modified, patient is consuming approximately 85% of meals at this time. Labs and medications reviewed. No further nutrition interventions warranted at this time. If additional nutrition issues arise, please re-consult RD.  Clayton Bibles, MS, RD, LDN Pager: [phone] After Hours Pager: [phone]

## 2015-05-29 NOTE — Progress Notes (Signed)
PROGRESS NOTE    Deborah [name]  [id number]  DOB: January 29, 1966  DOA: 05/25/2015 PCP: Ernest Haber, MD Outpatient Specialists:   Hospital course: 50 year old female patient with history of DM 2/IDDM, HTN, CVA, gastric ulcer by EGD earlier this year, gout, depression, HLD, presented to ED with complaints of ongoing nausea and vomiting for a few days prior to admission, followed by abdominal abdominal and chest pain related to vomiting. In the ED, blood glucose 430 and the anion gap of 19. Admitted initially for DKA.   Assessment & Plan:   DKA type II, not at goal - Likely precipitated by UTI or nausea and vomiting. - Treated per DKA protocol with IV fluids, insulin drip, close monitoring of BMP. After DKA was resolved, transitioned to lower than home dose of protamine insulin. - A1c: 9.4. - Good inpatient control despite starting oral prednisone for acute gout.  Nausea, vomiting and abdominal pain - May have been due to UTI, acute GE, diabetic gastroparesis. Resolved. - CT chest and abdomen without acute findings.  Presumed UTI/?trichomoniasis  - Urine culture shows multiple organisms-likely contaminant. Completed 3 days of levofloxacin. Patient did not have any urinary symptoms on admission and hence discontinued antibiotics. - Consider treating for trichomoniasis with a dose of Flagyl. States not sexually active for about 2 years and is perimenopausal. - Blood cultures 2: Negative to date.  Essential hypertension - Controlled on metoprolol and amlodipine.  Hyperlipidemia - Continue statins.  Stage II chronic kidney disease - Stable.  Depression - Stable. Continue Cymbalta.  Gastric ulcer by EGD - Continue PPI. Avoid NSAIDs. - Underwent EGD at [facility] last month.  Acute gouty arthritis - Of left ankle and toes. No significant relief with colchicine. Already on allopurinol. Hesitant to use NSAIDs secondary to gastric ulcer history. Started prednisone  40 mg daily 3 days.  Slowly improving.  Hypokalemia - Replaced.  Hypomagnesemia  - Replaced   Anemia - Stable   " Tumor " Right leg -  Apparently was supposed to see orthopedics for second opinion but missed appointment due to admission. Advised outpatient follow-up. No acute findings or findings related to same  DVT prophylaxis: SCDs Code Status: Full Family Communication: None at bedside. Disposition Plan: DC home possibly in the next 24-48 hours   Consultants:  None  Procedures:  None  Antimicrobials:  Levofloxacin 3/20 > 3/22   Subjective: left ankle and toe pains are better than 3/22. No N/V/D.  Objective: Filed Vitals:   05/28/15 1500 05/28/15 2122 05/29/15 0700 05/29/15 1304  BP: 118/67 121/63 139/85 126/64  Pulse: 95 107 104 99  Temp: 97.9 F (36.6 C) 98.2 F (36.8 C) 98.1 F (36.7 C) 98 F (36.7 C)  TempSrc: Oral Oral Oral Oral  Resp: 20 18 20 18   Height:      Weight:      SpO2: 95% 98% 100% 99%    Intake/Output Summary (Last 24 hours) at 05/29/15 1550 Last data filed at 05/29/15 0941  Gross per 24 hour  Intake    720 ml  Output      0 ml  Net    720 ml   Filed Weights   05/25/15 2200 05/26/15 1606  Weight: 142.2 kg (313 lb 7.9 oz) 145.151 kg (320 lb)    Exam:  General exam: Moderately built and morbidly obese pleasant middle-aged female lying comfortably in bed. Respiratory system: Clear. No increased work of breathing. Cardiovascular system: S1 & S2 heard, RRR. No JVD, murmurs, gallops, clicks  or pedal edema. Gastrointestinal system: Abdomen is nondistended, soft and nontender. Normal bowel sounds heard. Central nervous system: Alert and oriented. No focal neurological deficits. Extremities: Symmetric 5 x 5 power. Left ankle and toes with mild swelling, increased warmth, tenderness and painful range of motion movements - all findings are slightly better than 3/22.   Data Reviewed: Basic Metabolic Panel:  Recent Labs Lab  05/26/15 0553 05/26/15 0819 05/26/15 1345 05/27/15 0401 05/28/15 0337  NA 142 141 140 141 139  K 3.3* 3.1* 3.6 3.8 4.1  CL 104 103 103 106 107  CO2 27 27 27 25 23   GLUCOSE 153* 116* 189* 143* 167*  BUN 16 16 15 15 17   CREATININE 1.16* 1.13* 1.11* 1.18* 1.18*  CALCIUM 8.8* 8.7* 8.7* 8.5* 8.7*  MG  --  1.5*  --  1.5* 1.6*  PHOS  --   --   --  2.7 3.2   Liver Function Tests:  Recent Labs Lab 05/28/15 0337  AST 20  ALT 17  ALKPHOS 79  BILITOT 0.6  PROT 7.4  ALBUMIN 3.3*   No results for input(s): LIPASE, AMYLASE in the last 168 hours. No results for input(s): AMMONIA in the last 168 hours. CBC:  Recent Labs Lab 05/25/15 1301 05/26/15 0135 05/27/15 0401 05/28/15 0337  WBC 13.2* 13.2* 10.2 10.1  NEUTROABS  --  9.1*  --   --   HGB 11.8* 9.9* 9.9* 10.4*  HCT 36.4 31.3* 31.4* 33.2*  MCV 90.3 92.3 94.3 94.9  PLT 345 317 300 311   Cardiac Enzymes: No results for input(s): CKTOTAL, CKMB, CKMBINDEX, TROPONINI in the last 168 hours. BNP (last 3 results) No results for input(s): PROBNP in the last 8760 hours. CBG:  Recent Labs Lab 05/27/15 2123 05/28/15 0753 05/28/15 1210 05/28/15 1624 05/29/15 0747  GLUCAP 101* 159* 189* 181* 180*    Recent Results (from the past 240 hour(s))  MRSA PCR Screening     Status: None   Collection Time: 05/25/15  9:46 PM  Result Value Ref Range Status   MRSA by PCR NEGATIVE NEGATIVE Final    Comment:        The GeneXpert MRSA Assay (FDA approved for NASAL specimens only), is one component of a comprehensive MRSA colonization surveillance program. It is not intended to diagnose MRSA infection nor to guide or monitor treatment for MRSA infections.   Culture, blood (routine x 2)     Status: None (Preliminary result)   Collection Time: 05/26/15  8:18 AM  Result Value Ref Range Status   Specimen Description BLOOD LEFT HAND  Final   Special Requests IN PEDIATRIC BOTTLE [place]  Final   Culture   Final    NO GROWTH 3  DAYS Performed at [facility]    Report Status PENDING  Incomplete  Culture, blood (routine x 2)     Status: None (Preliminary result)   Collection Time: 05/26/15  8:19 AM  Result Value Ref Range Status   Specimen Description BLOOD LEFT HAND  Final   Special Requests BOTTLES DRAWN AEROBIC AND ANAEROBIC 5CC  Final   Culture   Final    NO GROWTH 3 DAYS Performed at [facility]    Report Status PENDING  Incomplete  Culture, Urine     Status: None   Collection Time: 05/26/15  2:03 PM  Result Value Ref Range Status   Specimen Description URINE, CLEAN CATCH  Final   Special Requests NONE  Final   Culture  Final    MULTIPLE SPECIES PRESENT, SUGGEST RECOLLECTION Performed at [facility]    Report Status 05/27/2015 FINAL  Final         Studies: No results found.      Scheduled Meds: . allopurinol  100 mg Oral Daily  . amLODipine  10 mg Oral Daily  . antiseptic oral rinse  7 mL Mouth Rinse BID  . colchicine  0.6 mg Oral Daily  . DULoxetine  60 mg Oral Daily  . gabapentin  300 mg Oral TID  . insulin aspart protamine- aspart  25 Units Subcutaneous Q lunch  . insulin aspart protamine- aspart  50 Units Subcutaneous Q supper  . insulin aspart protamine- aspart  50 Units Subcutaneous Q breakfast  . metoprolol succinate  25 mg Oral Daily  . multivitamin with minerals  1 tablet Oral Daily  . pantoprazole  40 mg Oral BID  . pravastatin  10 mg Oral q1800  . predniSONE  40 mg Oral Q breakfast   Continuous Infusions:    Principal Problem:   DKA (diabetic ketoacidoses) (HCC) Active Problems:   Essential hypertension, benign   Uncontrolled type 2 diabetes mellitus with diabetic neuropathy, with long-term current use of insulin (HCC)   Dyslipidemia associated with type 2 diabetes mellitus (HCC)   CKD (chronic kidney disease) stage 3, GFR 30-59 ml/min   Leukocytosis   Nausea with vomiting   Abdominal pain   [name]   UTI (urinary tract  infection)   Gout flare    Time spent: 25 minutes.    Vernell Leep, MD, FACP, FHM. [facility] Pager [phone]  If 7PM-7AM, please contact night-coverage [email or web address] Password [email or web address] 05/29/2015, 3:50 PM    LOS: 4 days

## 2015-05-30 ENCOUNTER — Inpatient Hospital Stay (HOSPITAL_COMMUNITY): Payer: BLUE CROSS/BLUE SHIELD

## 2015-05-30 ENCOUNTER — Ambulatory Visit (HOSPITAL_COMMUNITY): Payer: BLUE CROSS/BLUE SHIELD

## 2015-05-30 DIAGNOSIS — M109 Gout, unspecified: Secondary | ICD-10-CM

## 2015-05-30 LAB — GLUCOSE, CAPILLARY
GLUCOSE-CAPILLARY: 148 mg/dL — AB (ref 65–99)
GLUCOSE-CAPILLARY: 141 mg/dL — AB (ref 65–99)
GLUCOSE-CAPILLARY: 342 mg/dL — AB (ref 65–99)
GLUCOSE-CAPILLARY: 277 mg/dL — AB (ref 65–99)

## 2015-05-30 MED ORDER — INSULIN ASPART PROT & ASPART (70-30 MIX) 100 UNIT/ML ~~LOC~~ SUSP
65.0000 [IU] | Freq: Every day | SUBCUTANEOUS | Status: DC
  Administered 2015-05-30 – 2015-05-31 (×2): 65 [IU] via SUBCUTANEOUS
  Filled 2015-05-30: qty 10

## 2015-05-30 MED ORDER — INSULIN ASPART PROT & ASPART (70-30 MIX) 100 UNIT/ML ~~LOC~~ SUSP
65.0000 [IU] | Freq: Every day | SUBCUTANEOUS | Status: DC
  Administered 2015-05-30: 65 [IU] via SUBCUTANEOUS
  Filled 2015-05-30: qty 10

## 2015-05-30 MED ORDER — INSULIN ASPART PROT & ASPART (70-30 MIX) 100 UNIT/ML ~~LOC~~ SUSP
40.0000 [IU] | Freq: Every day | SUBCUTANEOUS | Status: DC
  Administered 2015-05-30: 40 [IU] via SUBCUTANEOUS
  Filled 2015-05-30: qty 10

## 2015-05-30 NOTE — Progress Notes (Signed)
VASCULAR LAB PRELIMINARY  PRELIMINARY  PRELIMINARY  PRELIMINARY  Left lower extremity venous duplex completed.    Preliminary report:  Left:  No evidence of DVT, superficial thrombosis, or Baker's cyst.  Adriene Padula, RVT 05/30/2015, 1:47 PM

## 2015-05-30 NOTE — Consult Note (Signed)
Deborah [name] is an 50 y.o. female.    Chief Complaint: left foot/ankle pain  HPI: 50 y/o female with 3 day history of left ankle/foot swelling and pain. Has hx of gouty arthritis with 2 previous episodes, last of which about 5 months ago. Usually pain resolves with allopurinol, colcrys, and NSAIDS but she has been treated with these medications over the past few days without relief. Currently admitted for nausea and recently diagnosed gastric ulcer. Pt placed into cam boot just prior to arrival and chart review initiated. Pt also c/o hx of osteochondroma to right tibia previously evaluated by Dr. Magda Bernheim at [place] pain with weight bearing and it feels warm. Denies any drainage or open wounds.  PCP:  Ernest Haber, MD  PMH: Past Medical History  Diagnosis Date  . Diabetes mellitus   . Hypertension   . Stroke ([place]) 02/2013  . Pneumonia   . Gastric ulcer     PSH: Past Surgical History  Procedure Laterality Date  . No past surgeries    . Cataract extraction  01/2014    Social History:  reports that she has never smoked. She has never used smokeless tobacco. She reports that she does not drink alcohol or use illicit drugs.  Allergies:  Allergies  Allergen Reactions  . Penicillins Palpitations    Has patient had a PCN reaction causing immediate rash, facial/tongue/throat swelling, SOB or lightheadedness with hypotension: Yes Has patient had a PCN reaction causing severe rash involving mucus membranes or skin necrosis: No Has patient had a PCN reaction that required hospitalization: Yes  Has patient had a PCN reaction occurring within the last 10 years: No   . Valium [Diazepam] [name]  . Lisinopril Swelling    Tongue and mouth swelling  . Food Hives and Swelling    Carrots, ketchup     Medications: Current Facility-Administered Medications  Medication Dose Route Frequency Provider Last Rate Last Dose  . acetaminophen (TYLENOL) tablet 650 mg   650 mg Oral Q6H PRN Modena Jansky, MD      . albuterol (PROVENTIL) (2.5 MG/3ML) 0.083% nebulizer solution 2.5 mg  2.5 mg Nebulization Q6H PRN Robbie Lis, MD   2.5 mg at 05/27/15 2239  . allopurinol (ZYLOPRIM) tablet 100 mg  100 mg Oral Daily Robbie Lis, MD   100 mg at 05/30/15 0953  . amLODipine (NORVASC) tablet 10 mg  10 mg Oral Daily Robbie Lis, MD   10 mg at 05/30/15 0953  . antiseptic oral rinse (CPC / CETYLPYRIDINIUM CHLORIDE 0.05%) solution 7 mL  7 mL Mouth Rinse BID Robbie Lis, MD   7 mL at 05/30/15 0954  . colchicine tablet 0.6 mg  0.6 mg Oral Daily Simbiso Ranga, MD   0.6 mg at 05/30/15 0953  . diphenhydrAMINE (BENADRYL) capsule 25 mg  25 mg Oral Q6H PRN Gardiner Barefoot, NP   25 mg at 05/30/15 0750  . DULoxetine (CYMBALTA) DR capsule 60 mg  60 mg Oral Daily Robbie Lis, MD   60 mg at 05/30/15 0953  . gabapentin (NEURONTIN) capsule 300 mg  300 mg Oral TID Robbie Lis, MD   300 mg at 05/26/15 2239  . HYDROcodone-acetaminophen (NORCO/VICODIN) 5-325 MG per tablet 1-2 tablet  1-2 tablet Oral Q6H PRN Modena Jansky, MD   2 tablet at 05/30/15 0954  . HYDROmorphone (DILAUDID) injection 0.5 mg  0.5 mg Intravenous Q2H PRN Modena Jansky, MD   0.5 mg at  05/30/15 1355  . insulin aspart protamine- aspart (NOVOLOG MIX 70/30) injection 40 Units  40 Units Subcutaneous Q lunch Modena Jansky, MD   40 Units at 05/30/15 1250  . insulin aspart protamine- aspart (NOVOLOG MIX 70/30) injection 65 Units  65 Units Subcutaneous Q supper Modena Jansky, MD      . insulin aspart protamine- aspart (NOVOLOG MIX 70/30) injection 65 Units  65 Units Subcutaneous Q breakfast Modena Jansky, MD   65 Units at 05/30/15 [phone]  . metoCLOPramide (REGLAN) injection 5 mg  5 mg Intravenous Q4H PRN Simbiso Ranga, MD      . metoprolol succinate (TOPROL-XL) 24 hr tablet 25 mg  25 mg Oral Daily Robbie Lis, MD   25 mg at 05/30/15 0953  . multivitamin with minerals tablet 1 tablet  1 tablet Oral Daily  Robbie Lis, MD   1 tablet at 05/30/15 [phone]  . ondansetron (ZOFRAN) injection 4 mg  4 mg Intravenous Q6H PRN Dianne Dun, NP   4 mg at 05/26/15 0954  . pantoprazole (PROTONIX) EC tablet 40 mg  40 mg Oral BID Simbiso Ranga, MD   40 mg at 05/30/15 0953  . pravastatin (PRAVACHOL) tablet 10 mg  10 mg Oral q1800 Robbie Lis, MD   10 mg at 05/29/15 1831  . predniSONE (DELTASONE) tablet 40 mg  40 mg Oral Q breakfast Modena Jansky, MD   40 mg at 05/30/15 [id number]    Results for orders placed or performed during the hospital encounter of 05/25/15 (from the past 48 hour(s))  Pregnancy, urine     Status: None   Collection Time: 05/28/15 10:20 PM  Result Value Ref Range   Preg Test, Ur NEGATIVE NEGATIVE    Comment:        THE SENSITIVITY OF THIS METHODOLOGY IS >20 mIU/mL.   Urinalysis, Routine w reflex microscopic (not at [facility])     Status: Abnormal   Collection Time: 05/29/15  6:50 AM  Result Value Ref Range   Color, Urine YELLOW YELLOW   APPearance CLEAR CLEAR   Specific Gravity, Urine 1.030 1.005 - 1.030   pH 5.0 5.0 - 8.0   Glucose, UA >1000 (A) NEGATIVE mg/dL   Hgb urine dipstick TRACE (A) NEGATIVE   Bilirubin Urine NEGATIVE NEGATIVE   Ketones, ur NEGATIVE NEGATIVE mg/dL   Protein, ur 30 (A) NEGATIVE mg/dL   Nitrite NEGATIVE NEGATIVE   Leukocytes, UA NEGATIVE NEGATIVE  Urine microscopic-add on     Status: Abnormal   Collection Time: 05/29/15  6:50 AM  Result Value Ref Range   Squamous Epithelial / LPF 0-5 (A) NONE SEEN   WBC, UA 0-5 0 - 5 WBC/hpf   RBC / HPF 0-5 0 - 5 RBC/hpf   Bacteria, UA RARE (A) NONE SEEN  Glucose, capillary     Status: Abnormal   Collection Time: 05/29/15  7:47 AM  Result Value Ref Range   Glucose-Capillary 180 (H) 65 - 99 mg/dL   Comment 1 Notify RN    Comment 2 Document in Chart   Glucose, capillary     Status: Abnormal   Collection Time: 05/29/15 11:51 AM  Result Value Ref Range   Glucose-Capillary 172 (H) 65 - 99 mg/dL   Comment 1 Notify  RN    Comment 2 Document in Chart   Glucose, capillary     Status: Abnormal   Collection Time: 05/29/15  4:59 PM  Result Value Ref Range   Glucose-Capillary 326 (  H) 65 - 99 mg/dL   Comment 1 Notify RN    Comment 2 Document in Chart   Glucose, capillary     Status: Abnormal   Collection Time: 05/29/15  8:54 PM  Result Value Ref Range   Glucose-Capillary 416 (H) 65 - 99 mg/dL   Comment 1 Notify RN   Glucose, capillary     Status: Abnormal   Collection Time: 05/30/15  7:33 AM  Result Value Ref Range   Glucose-Capillary 148 (H) 65 - 99 mg/dL   Comment 1 Notify RN    Comment 2 Document in Chart   Glucose, capillary     Status: Abnormal   Collection Time: 05/30/15 12:35 PM  Result Value Ref Range   Glucose-Capillary 141 (H) 65 - 99 mg/dL   Comment 1 Notify RN    Comment 2 Document in Chart   Glucose, capillary     Status: Abnormal   Collection Time: 05/30/15  4:48 PM  Result Value Ref Range   Glucose-Capillary 342 (H) 65 - 99 mg/dL   Comment 1 Notify RN    Comment 2 Document in Chart    Dg Ankle Complete Left  05/30/2015  CLINICAL DATA:  Left ankle pain, no known injury, initial encounter EXAM: LEFT ANKLE COMPLETE - 3+ VIEW COMPARISON:  None. FINDINGS: Mild degenerative changes of the tibiotalar joint are seen. No acute fracture or dislocation is noted. Tarsal degenerative changes are seen as well as a small calcaneal spur are. IMPRESSION: Degenerative change without acute abnormality. Electronically Signed   By: Inez Catalina M.D.   On: 05/30/2015 12:30   Dg Foot Complete Left  05/30/2015  CLINICAL DATA:  Left foot pain, no known injury, initial encounter EXAM: LEFT FOOT - COMPLETE 3+ VIEW COMPARISON:  12/11/2012 FINDINGS: Tarsal degenerative changes are noted. No acute fracture or dislocation is seen. Small calcaneal spur is noted. IMPRESSION: No acute bony abnormality noted. Electronically Signed   By: Inez Catalina M.D.   On: 05/30/2015 12:29    ROS: ROS Hx of diabetes,  neuropathy and currently being worked up for DKA Pain with weight bearing left lower extremity Previous hx of osteochondroma right tibia  Physical Exam: Alert and appropriate 50 y/o female in no acute distress Right knee with obvious crepitus with rom and pain to medial calf from hx of osteochondroma Full rom right ankle/foot Left knee full rom without tenderness Mild warmth but no erythema to left ankle and foot No rashes or signs of developing wounds Pes planus bilateral feet with pain with plantar and dorsiflexion of left foot/ankle nv intact distally bilaterally  Physical Exam   Assessment/Plan Assessment: left ankle pain secondary to 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

## 2015-05-30 NOTE — Progress Notes (Signed)
PROGRESS NOTE    Deborah [name]  [id number]  DOB: 1965/04/15  DOA: 05/25/2015 PCP: Ernest Haber, MD Outpatient Specialists:   Hospital course: 50 year old female patient with history of DM 2/IDDM, HTN, CVA, gastric ulcer by EGD earlier this year, gout, depression, HLD, presented to ED with complaints of ongoing nausea and vomiting for a few days prior to admission, followed by abdominal abdominal and chest pain related to vomiting. In the ED, blood glucose 430 and the anion gap of 19. Admitted initially for DKA which resolved. No further nausea, vomiting or diarrhea for several days. Treating for presumed acute gouty arthritis of left ankle and foot with mild and slow improvement. Orthopedics consulted for assistance on 3/24.   Assessment & Plan:   DKA type II, not at goal - Likely precipitated by UTI or nausea and vomiting. - Treated per DKA protocol with IV fluids, insulin drip, close monitoring of BMP. After DKA was resolved, transitioned to lower than home dose of 70/30 insulin. - A1c: 9.4. - CBGs increased to the 300-400 range on 3/23 afternoon. Increased 70/30 insulin closer to her home dose. Monitor closely.  Nausea, vomiting and abdominal pain - May have been due to UTI, acute GE, diabetic gastroparesis. Resolved. - CT chest and abdomen without acute findings.  Presumed UTI/?trichomoniasis  - Urine culture shows multiple organisms-likely contaminant. Completed 3 days of levofloxacin. Patient did not have any urinary symptoms on admission and hence discontinued antibiotics. - Consider treating for trichomoniasis with a dose of Flagyl. States not sexually active for about 2 years and is perimenopausal. - Blood cultures 2: Negative to date.  Essential hypertension - Controlled on metoprolol and amlodipine.  Hyperlipidemia - Continue statins.  Stage II chronic kidney disease - Stable.  Depression - Stable. Continue Cymbalta.  Gastric ulcer by EGD -  Continue PPI. Avoid NSAIDs. - Underwent EGD at [facility] last month.  Acute gouty arthritis - Of left ankle and toes. No significant relief with colchicine. Already on allopurinol. Hesitant to use NSAIDs secondary to gastric ulcer history. Started prednisone 40 mg daily 3 days.  Slowly improving. - States that her symptoms are only "10% better" and is concerned about swelling in her ankle, some throbbing pain and painful ambulation (lives in a two-story house). - Evaluated further with lower extremity venous Doppler-negative, x-rays of left ankle and foot without acute findings. - Orthopedics consulted. Await input. Cam boot ordered. - Requested nursing to ambulate patient.  Hypokalemia - Replaced.  Hypomagnesemia  - Replaced   Anemia - Stable   " Tumor " Right leg -  Apparently was supposed to see orthopedics for second opinion but missed appointment due to admission. Advised outpatient follow-up. No acute findings or findings related to same  DVT prophylaxis: SCDs Code Status: Full Family Communication: None at bedside. Disposition Plan: DC home possibly in the next 24 hours   Consultants:  Orthopedics  Procedures:  Left lower extremity venous Doppler 05/30/15: Summary: No evidence of deep vein thrombosis involving the right common femoral vein and left lower extremity.   Antimicrobials:  Levofloxacin 3/20 > 3/22   Subjective: Indicates that her left ankle and foot/toe pain is only "10% better" and is concerned about left ankle swelling. Requests orthopedic input.  Objective: Filed Vitals:   05/29/15 1304 05/29/15 2057 05/30/15 0521 05/30/15 1422  BP: 126/64 141/69 118/60 120/71  Pulse: 99 102 92 100  Temp: 98 F (36.7 C) 97.9 F (36.6 C) 97.7 F (36.5 C) 97.7 F (36.5 C)  TempSrc: Oral Oral Oral Oral  Resp: 18 18 18 20   Height:      Weight:      SpO2: 99% 100% 100% 100%    Intake/Output Summary (Last 24 hours) at 05/30/15 1622 Last data filed at  05/30/15 1300  Gross per 24 hour  Intake   1450 ml  Output      0 ml  Net   1450 ml   Filed Weights   05/25/15 2200 05/26/15 1606  Weight: 142.2 kg (313 lb 7.9 oz) 145.151 kg (320 lb)    Exam:  General exam: Moderately built and morbidly obese pleasant middle-aged female lying comfortably in bed. Respiratory system: Clear. No increased work of breathing. Cardiovascular system: S1 & S2 heard, RRR. No JVD, murmurs, gallops, clicks or pedal edema. Gastrointestinal system: Abdomen is nondistended, soft and nontender. Normal bowel sounds heard. Central nervous system: Alert and oriented. No focal neurological deficits. Extremities: Symmetric 5 x 5 power. Left ankle and toes with mild swelling(improved compared to 2 days ago) and no other acute findings.   Data Reviewed: Basic Metabolic Panel:  Recent Labs Lab 05/26/15 0553 05/26/15 0819 05/26/15 1345 05/27/15 0401 05/28/15 0337  NA 142 141 140 141 139  K 3.3* 3.1* 3.6 3.8 4.1  CL 104 103 103 106 107  CO2 27 27 27 25 23   GLUCOSE 153* 116* 189* 143* 167*  BUN 16 16 15 15 17   CREATININE 1.16* 1.13* 1.11* 1.18* 1.18*  CALCIUM 8.8* 8.7* 8.7* 8.5* 8.7*  MG  --  1.5*  --  1.5* 1.6*  PHOS  --   --   --  2.7 3.2   Liver Function Tests:  Recent Labs Lab 05/28/15 0337  AST 20  ALT 17  ALKPHOS 79  BILITOT 0.6  PROT 7.4  ALBUMIN 3.3*   No results for input(s): LIPASE, AMYLASE in the last 168 hours. No results for input(s): AMMONIA in the last 168 hours. CBC:  Recent Labs Lab 05/25/15 1301 05/26/15 0135 05/27/15 0401 05/28/15 0337  WBC 13.2* 13.2* 10.2 10.1  NEUTROABS  --  9.1*  --   --   HGB 11.8* 9.9* 9.9* 10.4*  HCT 36.4 31.3* 31.4* 33.2*  MCV 90.3 92.3 94.3 94.9  PLT 345 317 300 311   Cardiac Enzymes: No results for input(s): CKTOTAL, CKMB, CKMBINDEX, TROPONINI in the last 168 hours. BNP (last 3 results) No results for input(s): PROBNP in the last 8760 hours. CBG:  Recent Labs Lab 05/29/15 1151  05/29/15 1659 05/29/15 2054 05/30/15 0733 05/30/15 1235  GLUCAP 172* 326* 416* 148* 141*    Recent Results (from the past 240 hour(s))  MRSA PCR Screening     Status: None   Collection Time: 05/25/15  9:46 PM  Result Value Ref Range Status   MRSA by PCR NEGATIVE NEGATIVE Final    Comment:        The GeneXpert MRSA Assay (FDA approved for NASAL specimens only), is one component of a comprehensive MRSA colonization surveillance program. It is not intended to diagnose MRSA infection nor to guide or monitor treatment for MRSA infections.   Culture, blood (routine x 2)     Status: None (Preliminary result)   Collection Time: 05/26/15  8:18 AM  Result Value Ref Range Status   Specimen Description BLOOD LEFT HAND  Final   Special Requests IN PEDIATRIC BOTTLE [place]  Final   Culture   Final    NO GROWTH 4 DAYS Performed at [facility]  Report Status PENDING  Incomplete  Culture, blood (routine x 2)     Status: None (Preliminary result)   Collection Time: 05/26/15  8:19 AM  Result Value Ref Range Status   Specimen Description BLOOD LEFT HAND  Final   Special Requests BOTTLES DRAWN AEROBIC AND ANAEROBIC 5CC  Final   Culture   Final    NO GROWTH 4 DAYS Performed at [facility]    Report Status PENDING  Incomplete  Culture, Urine     Status: None   Collection Time: 05/26/15  2:03 PM  Result Value Ref Range Status   Specimen Description URINE, CLEAN CATCH  Final   Special Requests NONE  Final   Culture   Final    MULTIPLE SPECIES PRESENT, SUGGEST RECOLLECTION Performed at [facility]    Report Status 05/27/2015 FINAL  Final         Studies: Dg Ankle Complete Left  06-05-2015  CLINICAL DATA:  Left ankle pain, no known injury, initial encounter EXAM: LEFT ANKLE COMPLETE - 3+ VIEW COMPARISON:  None. FINDINGS: Mild degenerative changes of the tibiotalar joint are seen. No acute fracture or dislocation is noted. Tarsal degenerative changes are seen  as well as a small calcaneal spur are. IMPRESSION: Degenerative change without acute abnormality. Electronically Signed   By: Inez Catalina M.D.   On: 06/05/15 12:30   Dg Foot Complete Left  06/05/2015  CLINICAL DATA:  Left foot pain, no known injury, initial encounter EXAM: LEFT FOOT - COMPLETE 3+ VIEW COMPARISON:  12/11/2012 FINDINGS: Tarsal degenerative changes are noted. No acute fracture or dislocation is seen. Small calcaneal spur is noted. IMPRESSION: No acute bony abnormality noted. Electronically Signed   By: Inez Catalina M.D.   On: June 05, 2015 12:29        Scheduled Meds: . allopurinol  100 mg Oral Daily  . amLODipine  10 mg Oral Daily  . antiseptic oral rinse  7 mL Mouth Rinse BID  . colchicine  0.6 mg Oral Daily  . DULoxetine  60 mg Oral Daily  . gabapentin  300 mg Oral TID  . insulin aspart protamine- aspart  40 Units Subcutaneous Q lunch  . insulin aspart protamine- aspart  65 Units Subcutaneous Q supper  . insulin aspart protamine- aspart  65 Units Subcutaneous Q breakfast  . metoprolol succinate  25 mg Oral Daily  . multivitamin with minerals  1 tablet Oral Daily  . pantoprazole  40 mg Oral BID  . pravastatin  10 mg Oral q1800  . predniSONE  40 mg Oral Q breakfast   Continuous Infusions:    Principal Problem:   DKA (diabetic ketoacidoses) (HCC) Active Problems:   Essential hypertension, benign   Uncontrolled type 2 diabetes mellitus with diabetic neuropathy, with long-term current use of insulin (HCC)   Dyslipidemia associated with type 2 diabetes mellitus (HCC)   CKD (chronic kidney disease) stage 3, GFR 30-59 ml/min   Leukocytosis   Nausea with vomiting   Abdominal pain   [name]   UTI (urinary tract infection)   Gout flare    Time spent: 25 minutes.    Vernell Leep, MD, FACP, FHM. [facility] Pager [phone]  If 7PM-7AM, please contact night-coverage [email or web address] Password [email or web address] 06-05-2015, 4:22 PM    LOS: 5 days

## 2015-05-31 DIAGNOSIS — N183 Chronic kidney disease, stage 3 (moderate): Secondary | ICD-10-CM

## 2015-05-31 DIAGNOSIS — R109 Unspecified abdominal pain: Secondary | ICD-10-CM

## 2015-05-31 DIAGNOSIS — R112 Nausea with vomiting, unspecified: Secondary | ICD-10-CM

## 2015-05-31 LAB — CULTURE, BLOOD (ROUTINE X 2)
CULTURE: NO GROWTH
Culture: NO GROWTH

## 2015-05-31 LAB — GLUCOSE, CAPILLARY
GLUCOSE-CAPILLARY: 273 mg/dL — AB (ref 65–99)
Glucose-Capillary: 188 mg/dL — ABNORMAL HIGH (ref 65–99)

## 2015-05-31 MED ORDER — HYDROCODONE-ACETAMINOPHEN 5-325 MG PO TABS: 1.0000 | ORAL_TABLET | Freq: Four times a day (QID) | ORAL | Status: DC | PRN

## 2015-05-31 MED ORDER — PREDNISONE 10 MG PO TABS: ORAL_TABLET | ORAL | Status: DC

## 2015-05-31 NOTE — Progress Notes (Signed)
Patient discharged to home with family, discharge instructions reviewed with patient who verbalized understanding. 

## 2015-05-31 NOTE — Discharge Instructions (Addendum)
Type 2 Diabetes Mellitus, Adult Type 2 diabetes mellitus, often simply referred to as type 2 diabetes, is a long-lasting (chronic) disease. In type 2 diabetes, the pancreas does not make enough insulin (a hormone), the cells are less responsive to the insulin that is made (insulin resistance), or both. Normally, insulin moves sugars from food into the tissue cells. The tissue cells use the sugars for energy. The lack of insulin or the lack of normal response to insulin causes excess sugars to build up in the blood instead of going into the tissue cells. As a result, high blood sugar (hyperglycemia) develops. The effect of high sugar (glucose) levels can cause many complications. Type 2 diabetes was also previously called adult-onset diabetes, but it can occur at any age.  RISK FACTORS  A person is predisposed to developing type 2 diabetes if someone in the family has the disease and also has one or more of the following primary risk factors:  Weight gain, or being overweight or obese.  An inactive lifestyle.  A history of consistently eating high-calorie foods. Maintaining a normal weight and regular physical activity can reduce the chance of developing type 2 diabetes. SYMPTOMS  A person with type 2 diabetes may not show symptoms initially. The symptoms of type 2 diabetes appear slowly. The symptoms include:  Increased thirst (polydipsia).  Increased urination (polyuria).  Increased urination during the night (nocturia).  Sudden or unexplained weight changes.  Frequent, recurring infections.  Tiredness (fatigue).  Weakness.  Vision changes, such as blurred vision.  Fruity smell to your breath.  Abdominal pain.  Nausea or vomiting.  Cuts or bruises which are slow to heal.  Tingling or numbness in the hands or feet.  An open skin wound (ulcer). DIAGNOSIS Type 2 diabetes is frequently not diagnosed until complications of diabetes are present. Type 2 diabetes is diagnosed  when symptoms or complications are present and when blood glucose levels are increased. Your blood glucose level may be checked by one or more of the following blood tests:  A fasting blood glucose test. You will not be allowed to eat for at least 8 hours before a blood sample is taken.  A random blood glucose test. Your blood glucose is checked at any time of the day regardless of when you ate.  A hemoglobin A1c blood glucose test. A hemoglobin A1c test provides information about blood glucose control over the previous 3 months.  An oral glucose tolerance test (OGTT). Your blood glucose is measured after you have not eaten (fasted) for 2 hours and then after you drink a glucose-containing beverage. TREATMENT   You may need to take insulin or diabetes medicine daily to keep blood glucose levels in the desired range.  If you use insulin, you may need to adjust the dosage depending on the carbohydrates that you eat with each meal or snack.  Lifestyle changes are recommended as part of your treatment. These may include:  Following an individualized diet plan developed by a nutritionist or dietitian.  Exercising daily. Your health care providers will set individualized treatment goals for you based on your age, your medicines, how long you have had diabetes, and any other medical conditions you have. Generally, the goal of treatment is to maintain the following blood glucose levels:  Before meals (preprandial): 80-130 mg/dL.  After meals (postprandial): below 180 mg/dL.  A1c: less than 6.5-7%. HOME CARE INSTRUCTIONS   Have your hemoglobin A1c level checked twice a year.  Perform daily blood glucose monitoring  as directed by your health care provider. °· Monitor urine ketones when you are ill and as directed by your health care provider. °· Take your diabetes medicine or insulin as directed by your health care provider to maintain your blood glucose levels in the desired range. °¨ Never run  out of diabetes medicine or insulin. It is needed every day. °¨ If you are using insulin, you may need to adjust the amount of insulin given based on your intake of carbohydrates. Carbohydrates can raise blood glucose levels but need to be included in your diet. Carbohydrates provide vitamins, minerals, and fiber which are an essential part of a healthy diet. Carbohydrates are found in fruits, vegetables, whole grains, dairy products, legumes, and foods containing added sugars. °· Eat healthy foods. You should make an appointment to see a registered dietitian to help you create an eating plan that is right for you. °· Lose weight if you are overweight. °· Carry a medical alert card or wear your medical alert jewelry. °· Carry a 15-gram carbohydrate snack with you at all times to treat low blood glucose (hypoglycemia). Some examples of 15-gram carbohydrate snacks include: °¨ Glucose tablets, 3 or 4. °¨ Glucose gel, 15-gram tube. °¨ Raisins, 2 tablespoons (24 grams). °¨ Jelly beans, 6. °¨ Animal crackers, 8. °¨ Regular pop, 4 ounces (120 mL). °¨ Gummy treats, 9. °· Recognize hypoglycemia. Hypoglycemia occurs with blood glucose levels of 70 mg/dL and below. The risk for hypoglycemia increases when fasting or skipping meals, during or after intense exercise, and during sleep. Hypoglycemia symptoms can include: °¨ Tremors or shakes. °¨ Decreased ability to concentrate. °¨ Sweating. °¨ Increased heart rate. °¨ Headache. °¨ Dry mouth. °¨ Hunger. °¨ Irritability. °¨ Anxiety. °¨ Restless sleep. °¨ Altered speech or coordination. °¨ Confusion. °· Treat hypoglycemia promptly. If you are alert and able to safely swallow, follow the 15:15 rule: °¨ Take 15-20 grams of rapid-acting glucose or carbohydrate. Rapid-acting options include glucose gel, glucose tablets, or 4 ounces (120 mL) of fruit juice, regular soda, or low-fat milk. °¨ Check your blood glucose level 15 minutes after taking the glucose. °¨ Take 15-20 grams more of  glucose if the repeat blood glucose level is still 70 mg/dL or below. °¨ Eat a meal or snack within 1 hour once blood glucose levels return to normal. °· Be alert to feeling very thirsty and urinating more frequently than usual, which are early signs of hyperglycemia. An early awareness of hyperglycemia allows for prompt treatment. Treat hyperglycemia as directed by your health care provider. °· Engage in at least 150 minutes of moderate-intensity physical activity a week, spread over at least 3 days of the week or as directed by your health care provider. In addition, you should engage in resistance exercise at least 2 times a week or as directed by your health care provider. Try to spend no more than 90 minutes at one time inactive. °· Adjust your medicine and food intake as needed if you start a new exercise or sport. °· Follow your sick-day plan anytime you are unable to eat or drink as usual. °· Do not use any tobacco products including cigarettes, chewing tobacco, or electronic cigarettes. If you need help quitting, ask your health care provider. °· Limit alcohol intake to no more than 1 drink per day for nonpregnant women and 2 drinks per day for men. You should drink alcohol only when you are also eating food. Talk with your health care provider whether alcohol is safe   for you. Tell your health care provider if you drink alcohol several times a week.  Keep all follow-up visits as directed by your health care provider. This is important.  Schedule an eye exam soon after the diagnosis of type 2 diabetes and then annually.  Perform daily skin and foot care. Examine your skin and feet daily for cuts, bruises, redness, nail problems, bleeding, blisters, or sores. A foot exam by a health care provider should be done annually.  Brush your teeth and gums at least twice a day and floss at least once a day. Follow up with your dentist regularly.  Share your diabetes management plan with your workplace or  school.  Keep your immunizations up to date. It is recommended that you receive a flu (influenza) vaccine every year. It is also recommended that you receive a pneumonia (pneumococcal) vaccine. If you are 35 years of age or older and have never received a pneumonia vaccine, this vaccine may be given as a series of two separate shots. Ask your health care provider which additional vaccines may be recommended.  Learn to manage stress.  Obtain ongoing diabetes education and support as needed.  Participate in or seek rehabilitation as needed to maintain or improve independence and quality of life. Request a physical or occupational therapy referral if you are having foot or hand numbness, or difficulties with grooming, dressing, eating, or physical activity. SEEK MEDICAL CARE IF:   You are unable to eat food or drink fluids for more than 6 hours.  You have nausea and vomiting for more than 6 hours.  Your blood glucose level is over 240 mg/dL.  There is a change in mental status.  You develop an additional serious illness.  You have diarrhea for more than 6 hours.  You have been sick or have had a fever for a couple of days and are not getting better.  You have pain during any physical activity.  SEEK IMMEDIATE MEDICAL CARE IF:  You have difficulty breathing.  You have moderate to large ketone levels.   This information is not intended to replace advice given to you by your health care provider. Make sure you discuss any questions you have with your health care provider.   Document Released: 02/22/2005 Document Revised: 11/13/2014 Document Reviewed: 09/21/2011 Elsevier Interactive Patient Education 2016 [employer].  Gout Gout is an inflammatory arthritis caused by a buildup of uric acid crystals in the joints. Uric acid is a chemical that is normally present in the blood. When the level of uric acid in the blood is too high it can form crystals that deposit in your joints and  tissues. This causes joint redness, soreness, and swelling (inflammation). Repeat attacks are common. Over time, uric acid crystals can form into masses (tophi) near a joint, destroying bone and causing disfigurement. Gout is treatable and often preventable. CAUSES  The disease begins with elevated levels of uric acid in the blood. Uric acid is produced by your body when it breaks down a naturally found substance called purines. Certain foods you eat, such as meats and fish, contain high amounts of purines. Causes of an elevated uric acid level include:  Being passed down from parent to child (heredity).  Diseases that cause increased uric acid production (such as obesity, psoriasis, and certain cancers).  Excessive alcohol use.  Diet, especially diets rich in meat and seafood.  Medicines, including certain cancer-fighting medicines (chemotherapy), water pills (diuretics), and aspirin.  Chronic kidney disease. The kidneys are  no longer able to remove uric acid well.  Problems with metabolism. Conditions strongly associated with gout include:  Obesity.  High blood pressure.  High cholesterol.  Diabetes. Not everyone with elevated uric acid levels gets gout. It is not understood why some people get gout and others do not. Surgery, joint injury, and eating too much of certain foods are some of the factors that can lead to gout attacks. SYMPTOMS   An attack of gout comes on quickly. It causes intense pain with redness, swelling, and warmth in a joint.  Fever can occur.  Often, only one joint is involved. Certain joints are more commonly involved:  Base of the big toe.  Knee.  Ankle.  Wrist.  Finger. Without treatment, an attack usually goes away in a few days to weeks. Between attacks, you usually will not have symptoms, which is different from many other forms of arthritis. DIAGNOSIS  Your caregiver will suspect gout based on your symptoms and exam. In some cases, tests may  be recommended. The tests may include:  Blood tests.  Urine tests.  X-rays.  Joint fluid exam. This exam requires a needle to remove fluid from the joint (arthrocentesis). Using a microscope, gout is confirmed when uric acid crystals are seen in the joint fluid. TREATMENT  There are two phases to gout treatment: treating the sudden onset (acute) attack and preventing attacks (prophylaxis).  Treatment of an Acute Attack.  Medicines are used. These include anti-inflammatory medicines or steroid medicines.  An injection of steroid medicine into the affected joint is sometimes necessary.  The painful joint is rested. Movement can worsen the arthritis.  You may use warm or cold treatments on painful joints, depending which works best for you.  Treatment to Prevent Attacks.  If you suffer from frequent gout attacks, your caregiver may advise preventive medicine. These medicines are started after the acute attack subsides. These medicines either help your kidneys eliminate uric acid from your body or decrease your uric acid production. You may need to stay on these medicines for a very long time.  The early phase of treatment with preventive medicine can be associated with an increase in acute gout attacks. For this reason, during the first few months of treatment, your caregiver may also advise you to take medicines usually used for acute gout treatment. Be sure you understand your caregiver's directions. Your caregiver may make several adjustments to your medicine dose before these medicines are effective.  Discuss dietary treatment with your caregiver or dietitian. Alcohol and drinks high in sugar and fructose and foods such as meat, poultry, and seafood can increase uric acid levels. Your caregiver or dietitian can advise you on drinks and foods that should be limited. HOME CARE INSTRUCTIONS   Do not take aspirin to relieve pain. This raises uric acid levels.  Only take over-the-counter  or prescription medicines for pain, discomfort, or fever as directed by your caregiver.  Rest the joint as much as possible. When in bed, keep sheets and blankets off painful areas.  Keep the affected joint raised (elevated).  Apply warm or cold treatments to painful joints. Use of warm or cold treatments depends on which works best for you.  Use crutches if the painful joint is in your leg.  Drink enough fluids to keep your urine clear or pale yellow. This helps your body get rid of uric acid. Limit alcohol, sugary drinks, and fructose drinks.  Follow your dietary instructions. Pay careful attention to the amount of  protein you eat. Your daily diet should emphasize fruits, vegetables, whole grains, and fat-free or low-fat milk products. Discuss the use of coffee, vitamin C, and cherries with your caregiver or dietitian. These may be helpful in lowering uric acid levels.  Maintain a healthy body weight. SEEK MEDICAL CARE IF:   You develop diarrhea, vomiting, or any side effects from medicines.  You do not feel better in 24 hours, or you are getting worse. SEEK IMMEDIATE MEDICAL CARE IF:   Your joint becomes suddenly more tender, and you have chills or a fever. MAKE SURE YOU:   Understand these instructions.  Will watch your condition.  Will get help right away if you are not doing well or get worse.   This information is not intended to replace advice given to you by your health care provider. Make sure you discuss any questions you have with your health care provider.   Document Released: 02/20/2000 Document Revised: 03/15/2014 Document Reviewed: 10/06/2011 Elsevier Interactive Patient Education 2016 [employer].  Pain Medicine Instructions  HOW CAN PAIN MEDICINE AFFECT ME?    You were given a prescription for pain medicine. This medicine may make you tired or drowsy and may affect your ability to think clearly. Pain medicine may also affect your ability to drive or perform  certain physical activities. It may not be possible to make all of your pain go away, but you should be comfortable enough to move, breathe, and take care of yourself.  HOW OFTEN SHOULD I TAKE PAIN MEDICINE AND HOW MUCH SHOULD I TAKE?  Take pain medicine only as directed by your health care provider and only as needed for pain.  You do not need to take pain medicine if you are not having pain, unless directed by your health care provider.  You can take less than the prescribed dose if you find that a smaller amount of medicine controls your pain. WHAT RESTRICTIONS DO I HAVE WHILE TAKING PAIN MEDICINE?  Follow these instructions after you start taking pain medicine, while you are taking the medicine, and for 8 hours after you stop taking the medicine:  Do not drive.  Do not operate machinery.  Do not operate power tools.  Do not sign legal documents.  Do not drink alcohol.  Do not take sleeping pills.  Do not supervise children by yourself.  Do not participate in activities that require climbing or being in high places.  Do not enter a body of water--such as a lake, river, ocean, spa, or swimming pool--without an adult nearby who can monitor and help you. HOW CAN I KEEP OTHERS SAFE WHILE I AM TAKING PAIN MEDICINE?  Store your pain medicine as directed by your health care provider. Make sure that it is placed where children and pets cannot reach it.  Never share your pain medicine with anyone.  Do not save any leftover pills. If you have any leftover pain medicine, get rid of it or destroy it as directed by your health care provider. WHAT ELSE DO I NEED TO KNOW ABOUT TAKING PAIN MEDICINE?  Use a stool softener if you become constipated from your pain medicine. Increasing your intake of fruits and vegetables will also help with constipation.  Write down the times when you take your pain medicine. Look at the times before you take your next dose of medicine. It is easy to become confused while on  pain medicine. Recording the times helps you to avoid an overdose.  If your pain is  severe, do not try to treat it yourself by taking more pills than instructed on your prescription. Contact your health care provider for help.  You may have been prescribed a pain medicine that contains acetaminophen. Do not take any other acetaminophen while taking this medicine. An overdose of acetaminophen can result in severe liver damage. Acetaminophen is found in many over-the-counter (OTC) and prescription medicines. If you are taking any medicines in addition to your pain medicine, check the active ingredients on those medicines to see if acetaminophen is listed. WHEN SHOULD I CALL MY HEALTH CARE PROVIDER?  Your medicine is not helping to make the pain go away.  You vomit or have diarrhea shortly after taking the medicine.  You develop new pain in areas that did not hurt before.  You have an allergic reaction to your medicine. This may include:  Itchiness.  Swelling.  Dizziness.  Developing a new rash. WHEN SHOULD I CALL 911 OR GO TO THE EMERGENCY ROOM?  You feel dizzy or you faint.  You are very confused or disoriented.  You repeatedly vomit.  Your skin or lips turn pale or bluish in color.  You have shortness of breath or you are breathing much more slowly than usual.  You have a severe allergic reaction to your medicine. This includes:  Developing tongue swelling.  Having difficulty breathing. This information is not intended to replace advice given to you by your health care provider. Make sure you discuss any questions you have with your health care provider.  Document Released: 05/31/2000 Document Revised: 07/09/2014 Document Reviewed: 12/27/2013  Elsevier Interactive Patient Education [employer].

## 2015-05-31 NOTE — Discharge Summary (Signed)
Physician Discharge Summary  Deborah [name]  [id number]  DOB: 1965/03/29  DOA: 05/25/2015  PCP: Ernest Haber, MD  Admit date: 05/25/2015 Discharge date: 05/31/2015  Time spent: Greater than 30 minutes  Recommendations for Outpatient Follow-up:  1. Dr. Ernest Haber, PCP in 5 days with repeat labs (CBC & BMP). 2. [facility], in 2 weeks.  Discharge Diagnoses:  Principal Problem:   DKA (diabetic ketoacidoses) ([place]) Active Problems:   Essential hypertension, benign   Uncontrolled type 2 diabetes mellitus with diabetic neuropathy, with long-term current use of insulin (H[facility])   Dyslipidemia associated with type 2 diabetes mellitus ([facility])   CKD (chronic kidney disease) stage 3, GFR 30-59 ml/min   Leukocytosis   Nausea with vomiting   Abdominal pain   [name]   UTI (urinary tract infection)   Gout flare   Discharge Condition: Improved & Stable  Diet recommendation: Heart healthy and diabetic diet.  Filed Weights   05/25/15 2200 05/26/15 1606  Weight: 142.2 kg (313 lb 7.9 oz) 145.151 kg (320 lb)    History of present illness:  50 year old female patient with history of DM 2/IDDM, HTN, CVA, gastric ulcer by EGD earlier this year, gout, depression, HLD, presented to ED with complaints of ongoing nausea and vomiting for a few days prior to admission, followed by abdominal abdominal and chest pain related to vomiting. In the ED, blood glucose 430, anion gap 19 but bicarbonate was normal. Admitted initially for increased anion gap metabolic acidosis- which resolved (probably not DKA). GI symptoms resolved but she then developed left ankle and foot pain and treated for presumed acute gouty arthritis versus developing sharp reports Charcot's arthropathy. Improved.   Hospital Course:   Nausea, vomiting and abdominal pain - Unclear etiology. DD-acute viral GE versus gastroparesis versus less likely to have been DKA. - Although presumptively  treated for UTI, she did not have any urinary symptoms prior to admission and hence antibiotics were discontinued after 3 days of levofloxacin. - CT chest and abdomen were without acute findings. - Resolved.  Poorly controlled DM 2 - On admission, patient had blood glucose of 430, anion gap 19 however bicarbonate was 24. So although she had increased anion gap metabolic acidosis, do not believe that this was due to DKA. Urine microscopy was negative for ketones. Unfortunately no lactate was checked. - She was however treated per DKA protocol with aggressive IV fluids, insulin drip, close monitoring of BMP. After her anion gap had closed, she was transitioned to lower than home dose of 70/30 insulin's which were uptitrated as her blood sugar started to climb. Some of her current hyperglycemia is precipitated by steroids started for acute gout and these are being rapidly tapered off. - Hemoglobin A1c: 9.4.  Asymptomatic bacteriuria/trichomonas in urine. - Urine culture showed multiple organisms-likely contaminant. Patient completed 3 days of levofloxacin. She did not have any urinary symptoms on admission and hence antibiotics were discontinued. - Patient states that she is perimenopausal and has not been sexually active for 2 years. Hence did not treat for Trichomonas that was seen on urine microscopy. - Blood cultures 2: Negative.  Essential hypertension - Controlled on metoprolol, amlodipine. Resume HCTZ which was briefly held in the hospital.  Acute gouty arthritis versus developing Charcot's arthropathy of left ankle and foot - Patient is on allopurinol at home. Briefly started on colchicine without improvement. No NSAIDs were used due to recent EGD confirmed gastric ulcer and risk for bleeding. Patient started on oral prednisone  with slow improvement. Orthopedics was consulted. Discussed with Dr. Delfino Lovett on 3/24 and he suspects that her left ankle pain may be secondary to Charcot Arthropathy  but gout is on the differential. He recommended continuing prednisone taper, cam boot and outpatient follow-up with wound care clinic in 2 weeks. Patient's symptoms have significantly improved, she is able to weight-bear and has ambulated in her room and in the halls without much discomfort. - She will be discharged to complete rapid prednisone taper, short course of opioid pain medications to carry her through the weekend and then she has to contact her PCP (she has been advised and verbalizes understanding). She has been advised not to drive while on these medications.  Hyperlipidemia - Continue statins  Stage II chronic kidney disease - Stable  Depression - Stable. Continue Cymbalta  Gastric ulcer by EGD - States that she has stopped taking aspirin after diagnosed with ulcer in January. Advised to avoid NSAIDs. Stable.  Hypokalemia/hypomagnesemia - Replaced.  Anemia  - Stable. Outpatient follow-up.   " Tumor " Right leg - Apparently was supposed to see orthopedics for second opinion but missed appointment due to admission. Advised outpatient follow-up. No acute findings or findings related to same - As per orthopedic consultation, she has osteochondroma. She can follow-up with orthopedics as outpatient for further evaluation and management.     Consultants:  Orthopedics  Procedures:  Left lower extremity venous Doppler 05/30/15: Summary: No evidence of deep vein thrombosis involving the right common femoral vein and left lower extremity.   Antimicrobials:  Levofloxacin 3/20 > 3/22   Discharge Exam:  Complaints: Feels much better after wearing the CAM boot. Has ambulated in the room & in the hall with no significant pain. Anxious to go home.   Filed Vitals:   05/30/15 1422 05/30/15 2310 05/31/15 0235 05/31/15 0510  BP: 120/71 134/66 130/80 99/81  Pulse: 100 104 95 92  Temp: 97.7 F (36.5 C) 98 F (36.7 C) 98 F (36.7 C) 98.1 F (36.7 C)  TempSrc: Oral Oral  Oral Oral  Resp: 20 20 20 16   Height:      Weight:      SpO2: 100% 100% 98% 97%    General exam: Moderately built and morbidly obese pleasant middle-aged female sitting up comfortably. Respiratory system: Clear. No increased work of breathing. Cardiovascular system: S1 & S2 heard, RRR. No JVD, murmurs, gallops, clicks or pedal edema. Gastrointestinal system: Abdomen is nondistended, soft and nontender. Normal bowel sounds heard. Central nervous system: Alert and oriented. No focal neurological deficits. Extremities: Symmetric 5 x 5 power. Left ankle and toes with mild swelling(improved compared to 3 days ago) and no other acute findings. Wearing cam boot L foot.  Discharge Instructions      Discharge Instructions    Call MD for:  difficulty breathing, headache or visual disturbances    Complete by:  As directed      Call MD for:  extreme fatigue    Complete by:  As directed      Call MD for:  persistant dizziness or light-headedness    Complete by:  As directed      Call MD for:  persistant nausea and vomiting    Complete by:  As directed      Call MD for:  redness, tenderness, or signs of infection (pain, swelling, redness, odor or green/yellow discharge around incision site)    Complete by:  As directed      Call MD for:  severe  uncontrolled pain    Complete by:  As directed      Call MD for:  temperature >100.4    Complete by:  As directed      Diet - low sodium heart healthy    Complete by:  As directed      Diet Carb Modified    Complete by:  As directed      Increase activity slowly    Complete by:  As directed             Medication List    STOP taking these medications        acetaminophen 650 MG CR tablet  Commonly known as:  TYLENOL     aspirin 325 MG tablet     ibuprofen 200 MG tablet  Commonly known as:  ADVIL,MOTRIN      TAKE these medications        albuterol 108 (90 Base) MCG/ACT inhaler  Commonly known as:  PROVENTIL HFA;VENTOLIN HFA  Inhale  2 puffs into the lungs every 6 (six) hours as needed for wheezing or shortness of breath.     allopurinol 100 MG tablet  Commonly known as:  ZYLOPRIM  Take 100 mg by mouth daily.     amLODipine 10 MG tablet  Commonly known as:  NORVASC  Take 10 mg by mouth daily.     CYMBALTA 30 MG capsule  Generic drug:  DULoxetine  Take 60 mg by mouth daily.     gabapentin 300 MG capsule  Commonly known as:  NEURONTIN  Take 300 mg by mouth 3 (three) times daily.     hydrochlorothiazide 25 MG tablet  Commonly known as:  HYDRODIURIL  Take 1 tablet (25 mg total) by mouth daily.     HYDROcodone-acetaminophen 5-325 MG tablet  Commonly known as:  NORCO/VICODIN  Take 1-2 tablets by mouth every 6 (six) hours as needed for moderate pain or severe pain.     metoprolol succinate 25 MG 24 hr tablet  Commonly known as:  TOPROL-XL  Take 25 mg by mouth daily.     multivitamin with minerals tablet  Take 1 tablet by mouth daily.     NOVOLOG MIX 70/30 (70-30) 100 UNIT/ML injection  Generic drug:  insulin aspart protamine- aspart  Inject 50-75 Units as directed 3 (three) times daily after meals. 75 units every morning, 50 units every afternoon, 75 units every night     pantoprazole 40 MG tablet  Commonly known as:  PROTONIX  Take 40 mg by mouth 2 (two) times daily.     pravastatin 10 MG tablet  Commonly known as:  PRAVACHOL  Take 10 mg by mouth daily.     predniSONE 10 MG tablet  Commonly known as:  DELTASONE  Take 3 tablets daily 2 days, then 2 tablets daily 2 days, then 1 tablet daily 2 days, then stop.  Start taking on:  06/01/2015     ranitidine 150 MG tablet  Commonly known as:  ZANTAC  Take 150 mg by mouth daily as needed for heartburn.     Vitamin D (Ergocalciferol) 50000 units Caps capsule  Commonly known as:  DRISDOL  Take 50,000 Units by mouth every 7 (seven) days.       Follow-up Information    Follow up with PANNELL,CHRISTA, MD. Schedule an appointment as soon as possible for  a visit in 5 days.   Specialty:  Family Medicine   Why:  To be seen with repeat labs (CBC &  BMP). Reassess left ankle and foot pain.   Contact information:   [address] [address] [phone]       Follow up with [facility]             . Schedule an appointment as soon as possible for a visit in 2 weeks.   Why:  Call [phone] for appointment.   Contact information:   [address] [phone]      Get Medicines reviewed and adjusted: Please take all your medications with you for your next visit with your Primary MD  Please request your Primary MD to go over all hospital tests and procedure/radiological results at the follow up. Please ask your Primary MD to get all Hospital records sent to his/her office.  If you experience worsening of your admission symptoms, develop shortness of breath, life threatening emergency, suicidal or homicidal thoughts you must seek medical attention immediately by calling 911 or calling your MD immediately if symptoms less severe.  You must read complete instructions/literature along with all the possible adverse reactions/side effects for all the Medicines you take and that have been prescribed to you. Take any new Medicines after you have completely understood and accept all the possible adverse reactions/side effects.   Do not drive when taking pain medications.   Do not take more than prescribed Pain, Sleep and Anxiety Medications  Special Instructions: If you have smoked or chewed Tobacco in the last 2 yrs please stop smoking, stop any regular Alcohol and or any Recreational drug use.  Wear Seat belts while driving.  Please note  You were cared for by a hospitalist during your hospital stay. Once you are discharged, your primary care physician will handle any further medical issues. Please note that NO REFILLS for any discharge  medications will be authorized once you are discharged, as it is imperative that you return to your primary care physician (or establish a relationship with a primary care physician if you do not have one) for your aftercare needs so that they can reassess your need for medications and monitor your lab values.    The results of significant diagnostics from this hospitalization (including imaging, microbiology, ancillary and laboratory) are listed below for reference.    Significant Diagnostic Studies: Dg Chest 2 View  05/25/2015  CLINICAL DATA:  CP, SOB and vomiting x 2 days. Hx of DM and HTN. EXAM: CHEST  2 VIEW COMPARISON:  03/14/2015 FINDINGS: The heart size and mediastinal contours are within normal limits. Both lungs are clear. The visualized skeletal structures are unremarkable. IMPRESSION: No active cardiopulmonary disease. Electronically Signed   By: Nolon Nations M.D.   On: 05/25/2015 13:40   Dg Ankle Complete Left  05/30/2015  CLINICAL DATA:  Left ankle pain, no known injury, initial encounter EXAM: LEFT ANKLE COMPLETE - 3+ VIEW COMPARISON:  None. FINDINGS: Mild degenerative changes of the tibiotalar joint are seen. No acute fracture or dislocation is noted. Tarsal degenerative changes are seen as well as a small calcaneal spur are. IMPRESSION: Degenerative change without acute abnormality. Electronically Signed   By: Inez Catalina M.D.   On: 05/30/2015 12:30   Ct Chest W Contrast  05/26/2015  CLINICAL DATA:  Nausea and vomiting with chest pain EXAM: CT CHEST, ABDOMEN, AND PELVIS WITH CONTRAST TECHNIQUE: Multidetector CT imaging of the chest, abdomen and pelvis was performed following the standard protocol during bolus administration of intravenous  contrast. CONTRAST:  155mL OMNIPAQUE IOHEXOL 300 MG/ML  SOLN COMPARISON:  None. FINDINGS: CT CHEST FINDINGS Mediastinum/Lymph Nodes: No significant lymphadenopathy is noted. The thoracic aorta and pulmonary artery as visualized are within normal  limits. The thoracic inlet is unremarkable. Lungs/Pleura: The lungs are well aerated bilaterally without focal infiltrate or sizable effusion. Mild dependent atelectatic changes are seen. Musculoskeletal: No chest wall mass or suspicious bone lesions identified. CT ABDOMEN PELVIS FINDINGS Hepatobiliary: Fatty infiltration of the liver is noted. The gallbladder is within normal limits. Pancreas: No mass, inflammatory changes, or other significant abnormality. Spleen: Within normal limits in size and appearance. Adrenals/Urinary Tract: No masses identified. No evidence of hydronephrosis. Bladder is partially decompressed. Stomach/Bowel: No evidence of obstruction, inflammatory process, or abnormal fluid collections. The appendix is not well seen although no inflammatory changes are noted. Vascular/Lymphatic: No pathologically enlarged lymph nodes. No evidence of abdominal aortic aneurysm. Reproductive: No mass or other significant abnormality. Other: None. Musculoskeletal:  Degenerative changes of lumbar spine are noted. IMPRESSION: Chronic changes as described above. No acute abnormality is noted in the chest, abdomen and pelvis Electronically Signed   By: Inez Catalina M.D.   On: 05/26/2015 16:01   Ct Abdomen Pelvis W Contrast  05/26/2015  CLINICAL DATA:  Nausea and vomiting with chest pain EXAM: CT CHEST, ABDOMEN, AND PELVIS WITH CONTRAST TECHNIQUE: Multidetector CT imaging of the chest, abdomen and pelvis was performed following the standard protocol during bolus administration of intravenous contrast. CONTRAST:  158mL OMNIPAQUE IOHEXOL 300 MG/ML  SOLN COMPARISON:  None. FINDINGS: CT CHEST FINDINGS Mediastinum/Lymph Nodes: No significant lymphadenopathy is noted. The thoracic aorta and pulmonary artery as visualized are within normal limits. The thoracic inlet is unremarkable. Lungs/Pleura: The lungs are well aerated bilaterally without focal infiltrate or sizable effusion. Mild dependent atelectatic changes are  seen. Musculoskeletal: No chest wall mass or suspicious bone lesions identified. CT ABDOMEN PELVIS FINDINGS Hepatobiliary: Fatty infiltration of the liver is noted. The gallbladder is within normal limits. Pancreas: No mass, inflammatory changes, or other significant abnormality. Spleen: Within normal limits in size and appearance. Adrenals/Urinary Tract: No masses identified. No evidence of hydronephrosis. Bladder is partially decompressed. Stomach/Bowel: No evidence of obstruction, inflammatory process, or abnormal fluid collections. The appendix is not well seen although no inflammatory changes are noted. Vascular/Lymphatic: No pathologically enlarged lymph nodes. No evidence of abdominal aortic aneurysm. Reproductive: No mass or other significant abnormality. Other: None. Musculoskeletal:  Degenerative changes of lumbar spine are noted. IMPRESSION: Chronic changes as described above. No acute abnormality is noted in the chest, abdomen and pelvis Electronically Signed   By: Inez Catalina M.D.   On: 05/26/2015 16:01   Dg Foot Complete Left  05/30/2015  CLINICAL DATA:  Left foot pain, no known injury, initial encounter EXAM: LEFT FOOT - COMPLETE 3+ VIEW COMPARISON:  12/11/2012 FINDINGS: Tarsal degenerative changes are noted. No acute fracture or dislocation is seen. Small calcaneal spur is noted. IMPRESSION: No acute bony abnormality noted. Electronically Signed   By: Inez Catalina M.D.   On: 05/30/2015 12:29    Microbiology: Recent Results (from the past 240 hour(s))  MRSA PCR Screening     Status: None   Collection Time: 05/25/15  9:46 PM  Result Value Ref Range Status   MRSA by PCR NEGATIVE NEGATIVE Final    Comment:        The GeneXpert MRSA Assay (FDA approved for NASAL specimens only), is one component of a comprehensive MRSA colonization surveillance program. It is not intended  to diagnose MRSA infection nor to guide or monitor treatment for MRSA infections.   Culture, blood (routine x  2)     Status: None   Collection Time: 05/26/15  8:18 AM  Result Value Ref Range Status   Specimen Description BLOOD LEFT HAND  Final   Special Requests IN PEDIATRIC BOTTLE [place]  Final   Culture   Final    NO GROWTH 5 DAYS Performed at [name]    Report Status 05/31/2015 FINAL  Final  Culture, blood (routine x 2)     Status: None   Collection Time: 05/26/15  8:19 AM  Result Value Ref Range Status   Specimen Description BLOOD LEFT HAND  Final   Special Requests BOTTLES DRAWN AEROBIC AND ANAEROBIC 5CC  Final   Culture   Final    NO GROWTH 5 DAYS Performed at [facility]    Report Status 05/31/2015 FINAL  Final  Culture, Urine     Status: None   Collection Time: 05/26/15  2:03 PM  Result Value Ref Range Status   Specimen Description URINE, CLEAN CATCH  Final   Special Requests NONE  Final   Culture   Final    MULTIPLE SPECIES PRESENT, SUGGEST RECOLLECTION Performed at [facility]    Report Status 05/27/2015 FINAL  Final     Labs: Basic Metabolic Panel:  Recent Labs Lab 05/26/15 0553 05/26/15 0819 05/26/15 1345 05/27/15 0401 05/28/15 0337  NA 142 141 140 141 139  K 3.3* 3.1* 3.6 3.8 4.1  CL 104 103 103 106 107  CO2 27 27 27 25 23   GLUCOSE 153* 116* 189* 143* 167*  BUN 16 16 15 15 17   CREATININE 1.16* 1.13* 1.11* 1.18* 1.18*  CALCIUM 8.8* 8.7* 8.7* 8.5* 8.7*  MG  --  1.5*  --  1.5* 1.6*  PHOS  --   --   --  2.7 3.2   Liver Function Tests:  Recent Labs Lab 05/28/15 0337  AST 20  ALT 17  ALKPHOS 79  BILITOT 0.6  PROT 7.4  ALBUMIN 3.3*   No results for input(s): LIPASE, AMYLASE in the last 168 hours. No results for input(s): AMMONIA in the last 168 hours. CBC:  Recent Labs Lab 05/25/15 1301 05/26/15 0135 05/27/15 0401 05/28/15 0337  WBC 13.2* 13.2* 10.2 10.1  NEUTROABS  --  9.1*  --   --   HGB 11.8* 9.9* 9.9* 10.4*  HCT 36.4 31.3* 31.4* 33.2*  MCV 90.3 92.3 94.3 94.9  PLT 345 317 300 311   Cardiac Enzymes: No  results for input(s): CKTOTAL, CKMB, CKMBINDEX, TROPONINI in the last 168 hours. BNP: BNP (last 3 results)  Recent Labs  08/06/14 1318  BNP 17.9    ProBNP (last 3 results) No results for input(s): PROBNP in the last 8760 hours.  CBG:  Recent Labs Lab 05/30/15 1235 05/30/15 1648 05/30/15 2322 05/31/15 0705 05/31/15 1055  GLUCAP 141* 342* 277* 188* 273*       Signed:  Vernell Leep, MD, FACP, FHM. [facility] Pager [phone]  If 7PM-7AM, please contact night-coverage [email or web address] Password [email or web address] 05/31/2015, 12:03 PM

## 2015-07-15 ENCOUNTER — Emergency Department (HOSPITAL_COMMUNITY): Payer: Medicaid Other

## 2015-07-15 ENCOUNTER — Emergency Department (HOSPITAL_COMMUNITY)
Admission: EM | Admit: 2015-07-15 | Discharge: 2015-07-16 | Disposition: A | Discharge status: Home / Self Care | Payer: Medicaid Other | Attending: Emergency Medicine | Admitting: Emergency Medicine

## 2015-07-15 ENCOUNTER — Encounter (HOSPITAL_COMMUNITY): Payer: Self-pay | Admitting: Emergency Medicine

## 2015-07-15 DIAGNOSIS — E119 Type 2 diabetes mellitus without complications: Secondary | ICD-10-CM | POA: Diagnosis not present

## 2015-07-15 DIAGNOSIS — R109 Unspecified abdominal pain: Secondary | ICD-10-CM

## 2015-07-15 DIAGNOSIS — Z79899 Other long term (current) drug therapy: Secondary | ICD-10-CM | POA: Diagnosis not present

## 2015-07-15 DIAGNOSIS — I10 Essential (primary) hypertension: Secondary | ICD-10-CM | POA: Insufficient documentation

## 2015-07-15 DIAGNOSIS — R1013 Epigastric pain: Secondary | ICD-10-CM | POA: Diagnosis not present

## 2015-07-15 DIAGNOSIS — Z8701 Personal history of pneumonia (recurrent): Secondary | ICD-10-CM | POA: Insufficient documentation

## 2015-07-15 DIAGNOSIS — Z794 Long term (current) use of insulin: Secondary | ICD-10-CM | POA: Diagnosis not present

## 2015-07-15 DIAGNOSIS — R1110 Vomiting, unspecified: Secondary | ICD-10-CM | POA: Insufficient documentation

## 2015-07-15 DIAGNOSIS — Z8719 Personal history of other diseases of the digestive system: Secondary | ICD-10-CM | POA: Diagnosis not present

## 2015-07-15 DIAGNOSIS — R0602 Shortness of breath: Secondary | ICD-10-CM | POA: Diagnosis not present

## 2015-07-15 DIAGNOSIS — Z8673 Personal history of transient ischemic attack (TIA), and cerebral infarction without residual deficits: Secondary | ICD-10-CM | POA: Insufficient documentation

## 2015-07-15 DIAGNOSIS — Z880 Allergy status to penicillin: Secondary | ICD-10-CM | POA: Insufficient documentation

## 2015-07-15 LAB — HEPATIC FUNCTION PANEL
ALBUMIN: 3.9 g/dL (ref 3.5–5.0)
ALK PHOS: 88 U/L (ref 38–126)
ALT: 29 U/L (ref 14–54)
AST: 23 U/L (ref 15–41)
BILIRUBIN TOTAL: 1.1 mg/dL (ref 0.3–1.2)
Bilirubin, Direct: 0.1 mg/dL (ref 0.1–0.5)
Indirect Bilirubin: 1 mg/dL — ABNORMAL HIGH (ref 0.3–0.9)
Total Protein: 7.9 g/dL (ref 6.5–8.1)

## 2015-07-15 LAB — BASIC METABOLIC PANEL
ANION GAP: 14 (ref 5–15)
BUN: 15 mg/dL (ref 6–20)
CALCIUM: 9.5 mg/dL (ref 8.9–10.3)
CO2: 25 mmol/L (ref 22–32)
Chloride: 96 mmol/L — ABNORMAL LOW (ref 101–111)
Creatinine, Ser: 0.98 mg/dL (ref 0.44–1.00)
GFR calc Af Amer: >60 mL/min (ref >60)
GLUCOSE: 258 mg/dL — AB (ref 65–99)
Potassium: 3.5 mmol/L (ref 3.5–5.1)
Sodium: 135 mmol/L (ref 135–145)

## 2015-07-15 LAB — CBC
HCT: 35.3 % — ABNORMAL LOW (ref 36.0–46.0)
Hemoglobin: 11.7 g/dL — ABNORMAL LOW (ref 12.0–15.0)
MCH: 29.3 pg (ref 26.0–34.0)
MCHC: 33.1 g/dL (ref 30.0–36.0)
MCV: 88.3 fL (ref 78.0–100.0)
Platelets: 357 10*3/uL (ref 150–400)
RBC: 4 MIL/uL (ref 3.87–5.11)
RDW: 14.1 % (ref 11.5–15.5)
WBC: 11.6 10*3/uL — ABNORMAL HIGH (ref 4.0–10.5)

## 2015-07-15 LAB — CBG MONITORING, ED: Glucose-Capillary: 254 mg/dL — ABNORMAL HIGH (ref 65–99)

## 2015-07-15 LAB — I-STAT TROPONIN, ED: TROPONIN I, POC: 0 ng/mL (ref 0.00–0.08)

## 2015-07-15 LAB — LIPASE, BLOOD: LIPASE: 26 U/L (ref 11–51)

## 2015-07-15 MED ORDER — SODIUM CHLORIDE 0.9 % IV BOLUS (SEPSIS)
2000.0000 mL | Freq: Once | INTRAVENOUS | Status: AC
  Administered 2015-07-15: 2000 mL via INTRAVENOUS

## 2015-07-15 MED ORDER — ONDANSETRON HCL 4 MG PO TABS: 4.0000 mg | ORAL_TABLET | Freq: Four times a day (QID) | ORAL | Status: DC

## 2015-07-15 MED ORDER — PANTOPRAZOLE SODIUM 40 MG IV SOLR
40.0000 mg | Freq: Once | INTRAVENOUS | Status: AC
  Administered 2015-07-15: 40 mg via INTRAVENOUS
  Filled 2015-07-15: qty 40

## 2015-07-15 MED ORDER — ONDANSETRON HCL 4 MG/2ML IJ SOLN
4.0000 mg | Freq: Once | INTRAMUSCULAR | Status: AC
  Administered 2015-07-15: 4 mg via INTRAVENOUS
  Filled 2015-07-15: qty 2

## 2015-07-15 MED ORDER — HYDROMORPHONE HCL 1 MG/ML IJ SOLN
1.0000 mg | Freq: Once | INTRAMUSCULAR | Status: AC
  Administered 2015-07-15: 1 mg via INTRAVENOUS
  Filled 2015-07-15: qty 1

## 2015-07-15 MED ORDER — ONDANSETRON 4 MG PO TBDP
4.0000 mg | ORAL_TABLET | Freq: Once | ORAL | Status: AC
  Administered 2015-07-15: 4 mg via ORAL
  Filled 2015-07-15: qty 1

## 2015-07-15 MED ORDER — HYDROCODONE-ACETAMINOPHEN 5-325 MG PO TABS: 1.0000 | ORAL_TABLET | Freq: Four times a day (QID) | ORAL | Status: DC | PRN

## 2015-07-15 MED ORDER — SODIUM CHLORIDE 0.9 % IV BOLUS (SEPSIS): 1000.0000 mL | Freq: Once | INTRAVENOUS | Status: DC

## 2015-07-15 NOTE — ED Provider Notes (Signed)
[name]: [id number]     Arrival date & time 07/15/15  1645 History   First MD Initiated Contact with Patient 07/15/15 1753     Chief Complaint  Patient presents with  . [name]  . Chest Pain     (Consider location/radiation/quality/duration/timing/severity/associated sxs/prior Treatment) Patient is a 50 y.o. female presenting with abdominal pain. The history is provided by the patient (The patient complains of epigastric abdominal pain and vomiting).  Abdominal Pain Pain location:  Epigastric Pain quality: aching   Pain radiates to:  Epigastric region Pain severity:  Mild Onset quality:  Sudden Timing:  Constant Progression:  Waxing and waning Chronicity:  Recurrent Associated symptoms: no chest pain, no cough, no diarrhea, no fatigue and no hematuria     Past Medical History  Diagnosis Date  . Diabetes mellitus   . Hypertension   . Stroke ([place]) 02/2013  . Pneumonia   . Gastric ulcer    Past Surgical History  Procedure Laterality Date  . No past surgeries    . Cataract extraction  01/2014   Family History  Problem Relation Age of Onset  . Diabetes Mother   . Diabetes Father   . Heart disease Father   . Diabetes Sister   . Diabetes Brother    Social History  Substance Use Topics  . Smoking status: Never Smoker   . Smokeless tobacco: Never Used  . Alcohol Use: No   OB History    No data available     Review of Systems  Constitutional: Negative for appetite change and fatigue.  HENT: Negative for congestion, ear discharge and sinus pressure.   Eyes: Negative for discharge.  Respiratory: Negative for cough.   Cardiovascular: Negative for chest pain.  Gastrointestinal: Positive for abdominal pain. Negative for diarrhea.  Genitourinary: Negative for frequency and hematuria.  Musculoskeletal: Negative for back pain.  Skin: Negative for rash.  Neurological: Negative for seizures and headaches.  Psychiatric/Behavioral: Negative for hallucinations.       Allergies  Penicillins; Valium; Lisinopril; and Food  Home Medications   Prior to Admission medications   Medication Sig Start Date End Date Taking? Authorizing Provider  albuterol (PROVENTIL HFA;VENTOLIN HFA) 108 (90 BASE) MCG/ACT inhaler Inhale 2 puffs into the lungs every 6 (six) hours as needed for wheezing or shortness of breath.   Yes Historical Provider, MD  allopurinol (ZYLOPRIM) 100 MG tablet Take 100 mg by mouth daily. 02/14/15  Yes Historical Provider, MD  amLODipine (NORVASC) 10 MG tablet Take 10 mg by mouth daily. 02/11/15  Yes Historical Provider, MD  DULoxetine (CYMBALTA) 30 MG capsule Take 60 mg by mouth daily. 01/16/15  Yes Historical Provider, MD  gabapentin (NEURONTIN) 300 MG capsule Take 300 mg by mouth 3 (three) times daily. 05/15/15  Yes Historical Provider, MD  hydrochlorothiazide (HYDRODIURIL) 25 MG tablet Take 1 tablet (25 mg total) by mouth daily. 09/27/13  Yes Radhika P Phadke, MD  metoprolol succinate (TOPROL-XL) 25 MG 24 hr tablet Take 25 mg by mouth daily.   Yes Historical Provider, MD  Multiple Vitamins-Minerals (MULTIVITAMIN WITH MINERALS) tablet Take 1 tablet by mouth daily.   Yes Historical Provider, MD  NOVOLOG MIX 70/30 (70-30) 100 UNIT/ML injection Inject 50-75 Units as directed 3 (three) times daily after meals. 75 units every morning, 50 units every afternoon, 75 units every night 02/17/15  Yes Historical Provider, MD  pantoprazole (PROTONIX) 40 MG tablet Take 40 mg by mouth 2 (two) times daily. 05/23/15  Yes Historical Provider, MD  pravastatin (PRAVACHOL) 10 MG tablet Take 10 mg by mouth daily. 05/22/15  Yes Historical Provider, MD  promethazine (PHENERGAN) 25 MG tablet Take 25 mg by mouth every 6 (six) hours as needed for nausea or vomiting.  07/12/15 07/19/15 Yes Historical Provider, MD  Vitamin D, Ergocalciferol, (DRISDOL) 50000 UNITS CAPS capsule Take 50,000 Units by mouth every 7 (seven) days.   Yes Historical Provider, MD  HYDROcodone-acetaminophen  (NORCO/VICODIN) 5-325 MG tablet Take 1 tablet by mouth every 6 (six) hours as needed for moderate pain. 07/15/15   Milton Ferguson, MD  ondansetron (ZOFRAN) 4 MG tablet Take 1 tablet (4 mg total) by mouth every 6 (six) hours. 07/15/15   Milton Ferguson, MD  predniSONE (DELTASONE) 10 MG tablet Take 3 tablets daily 2 days, then 2 tablets daily 2 days, then 1 tablet daily 2 days, then stop. Patient not taking: Reported on 07/15/2015 06/01/15   Modena Jansky, MD  ranitidine (ZANTAC) 150 MG tablet Take 150 mg by mouth daily as needed for heartburn.     Historical Provider, MD   BP 113/97 mmHg  Pulse 114  Temp(Src) 97.6 F (36.4 C) (Oral)  Resp 18  SpO2 98% Physical Exam  Constitutional: She is oriented to person, place, and time. She appears well-developed.  HENT:  Head: Normocephalic.  Eyes: Conjunctivae and EOM are normal. No scleral icterus.  Neck: Neck supple. No thyromegaly present.  Cardiovascular: Normal rate and regular rhythm.  Exam reveals no gallop and no friction rub.   No murmur heard. Pulmonary/Chest: No stridor. She has no wheezes. She has no rales. She exhibits no tenderness.  Abdominal: She exhibits no distension. There is tenderness. There is no rebound.  Moderate epigastric tenderness  Musculoskeletal: Normal range of motion. She exhibits no edema.  Lymphadenopathy:    She has no cervical adenopathy.  Neurological: She is oriented to person, place, and time. She exhibits normal muscle tone. Coordination normal.  Skin: No rash noted. No erythema.  Psychiatric: She has a normal mood and affect. Her behavior is normal.    ED Course  Procedures (including critical care time) Labs Review Labs Reviewed  BASIC METABOLIC PANEL - Abnormal; Notable for the following:    Chloride 96 (*)    Glucose, Bld 258 (*)    All other components within normal limits  CBC - Abnormal; Notable for the following:    WBC 11.6 (*)    Hemoglobin 11.7 (*)    HCT 35.3 (*)    All other components  within normal limits  HEPATIC FUNCTION PANEL - Abnormal; Notable for the following:    Indirect Bilirubin 1.0 (*)    All other components within normal limits  CBG MONITORING, ED - Abnormal; Notable for the following:    Glucose-Capillary 254 (*)    All other components within normal limits  LIPASE, BLOOD  I-STAT TROPOININ, ED    Imaging Review Dg Chest 2 View  07/15/2015  CLINICAL DATA:  [name] and chest pain EXAM: CHEST  2 VIEW COMPARISON:  05/25/2015 FINDINGS: The heart size and mediastinal contours are within normal limits. Both lungs are clear. The visualized skeletal structures are unremarkable. IMPRESSION: No active cardiopulmonary disease. Electronically Signed   By: Inez Catalina M.D.   On: 07/15/2015 17:28   I have personally reviewed and evaluated these images and lab results as part of my medical decision-making.   EKG Interpretation   Date/Time:  Tuesday Jul 15 2015 16:59:13 EDT Ventricular Rate:  130 PR Interval:  149 QRS Duration: 78 QT Interval:  305 QTC Calculation: 448 R Axis:   -43 Text Interpretation:  Sinus tachycardia Left axis deviation RSR' in V1 or  V2, probably normal variant Borderline T abnormalities, anterior leads  Confirmed by Parth Mccormac  MD, Davarius Ridener [phone]) on 07/15/2015 9:55:19 PM      MDM   Final diagnoses:  Abdominal pain in female    Patient improved with protonic and pain medicine. Suspect peptic ulcer problems. Patient will increase protonic to twice a day will follow-up with her PCP this week    Milton Ferguson, MD 07/15/15 2353

## 2015-07-15 NOTE — ED Notes (Signed)
MD at bedside. 

## 2015-07-15 NOTE — ED Notes (Signed)
Pt c/o SOB, Chest pain, abdominal pain, emesis onset today. Pt routinely sticking fingers into throat to induce emesis. Pt in visible distress.

## 2015-07-15 NOTE — Discharge Instructions (Signed)
Increase your protonix to twice a day and follow up with your md in 2-3 days

## 2015-08-08 ENCOUNTER — Encounter (HOSPITAL_COMMUNITY): Payer: Self-pay | Admitting: Emergency Medicine

## 2015-08-08 ENCOUNTER — Emergency Department (HOSPITAL_COMMUNITY): Payer: Medicaid Other

## 2015-08-08 ENCOUNTER — Emergency Department (HOSPITAL_COMMUNITY)
Admission: EM | Admit: 2015-08-08 | Discharge: 2015-08-08 | Disposition: A | Discharge status: Other Acute Inpatient Hospital | Payer: Medicaid Other | Attending: Emergency Medicine | Admitting: Emergency Medicine

## 2015-08-08 DIAGNOSIS — I10 Essential (primary) hypertension: Secondary | ICD-10-CM | POA: Diagnosis not present

## 2015-08-08 DIAGNOSIS — Z8673 Personal history of transient ischemic attack (TIA), and cerebral infarction without residual deficits: Secondary | ICD-10-CM | POA: Insufficient documentation

## 2015-08-08 DIAGNOSIS — Z79899 Other long term (current) drug therapy: Secondary | ICD-10-CM | POA: Insufficient documentation

## 2015-08-08 DIAGNOSIS — Z794 Long term (current) use of insulin: Secondary | ICD-10-CM | POA: Insufficient documentation

## 2015-08-08 DIAGNOSIS — R1010 Upper abdominal pain, unspecified: Secondary | ICD-10-CM | POA: Diagnosis not present

## 2015-08-08 DIAGNOSIS — E119 Type 2 diabetes mellitus without complications: Secondary | ICD-10-CM | POA: Diagnosis not present

## 2015-08-08 DIAGNOSIS — R000 Tachycardia, unspecified: Secondary | ICD-10-CM

## 2015-08-08 DIAGNOSIS — R112 Nausea with vomiting, unspecified: Secondary | ICD-10-CM | POA: Diagnosis present

## 2015-08-08 LAB — COMPREHENSIVE METABOLIC PANEL
ALBUMIN: 3.7 g/dL (ref 3.5–5.0)
ALK PHOS: 73 U/L (ref 38–126)
ALT: 30 U/L (ref 14–54)
ANION GAP: 15 (ref 5–15)
AST: 43 U/L — ABNORMAL HIGH (ref 15–41)
BILIRUBIN TOTAL: 0.9 mg/dL (ref 0.3–1.2)
BUN: 15 mg/dL (ref 6–20)
CALCIUM: 8.9 mg/dL (ref 8.9–10.3)
CO2: 27 mmol/L (ref 22–32)
Chloride: 93 mmol/L — ABNORMAL LOW (ref 101–111)
Creatinine, Ser: 1.09 mg/dL — ABNORMAL HIGH (ref 0.44–1.00)
GFR, EST NON AFRICAN AMERICAN: 58 mL/min — AB (ref >60)
GLUCOSE: 338 mg/dL — AB (ref 65–99)
POTASSIUM: 3.6 mmol/L (ref 3.5–5.1)
Sodium: 135 mmol/L (ref 135–145)
TOTAL PROTEIN: 8.3 g/dL — AB (ref 6.5–8.1)

## 2015-08-08 LAB — CBC WITH DIFFERENTIAL/PLATELET
Basophils Absolute: 0 10*3/uL (ref 0.0–0.1)
Basophils Relative: 0 %
Eosinophils Absolute: 0 10*3/uL (ref 0.0–0.7)
Eosinophils Relative: 0 %
HEMATOCRIT: 31.4 % — AB (ref 36.0–46.0)
HEMOGLOBIN: 10.4 g/dL — AB (ref 12.0–15.0)
LYMPHS ABS: 2.7 10*3/uL (ref 0.7–4.0)
LYMPHS PCT: 21 %
MCH: 29.7 pg (ref 26.0–34.0)
MCHC: 33.1 g/dL (ref 30.0–36.0)
MCV: 89.7 fL (ref 78.0–100.0)
MONO ABS: 0.6 10*3/uL (ref 0.1–1.0)
MONOS PCT: 5 %
NEUTROS ABS: 9.6 10*3/uL — AB (ref 1.7–7.7)
NEUTROS PCT: 74 %
Platelets: 485 10*3/uL — ABNORMAL HIGH (ref 150–400)
RBC: 3.5 MIL/uL — ABNORMAL LOW (ref 3.87–5.11)
RDW: 14.4 % (ref 11.5–15.5)
WBC: 12.9 10*3/uL — ABNORMAL HIGH (ref 4.0–10.5)

## 2015-08-08 LAB — I-STAT CG4 LACTIC ACID, ED: LACTIC ACID, VENOUS: 2.28 mmol/L — AB (ref 0.5–2.0)

## 2015-08-08 LAB — CBG MONITORING, ED: Glucose-Capillary: 259 mg/dL — ABNORMAL HIGH (ref 65–99)

## 2015-08-08 LAB — LACTIC ACID, PLASMA: Lactic Acid, Venous: 3 mmol/L (ref 0.5–2.0)

## 2015-08-08 LAB — LIPASE, BLOOD: Lipase: 56 U/L — ABNORMAL HIGH (ref 11–51)

## 2015-08-08 MED ORDER — SODIUM CHLORIDE 0.9 % IV BOLUS (SEPSIS)
500.0000 mL | Freq: Once | INTRAVENOUS | Status: AC
  Administered 2015-08-08: 500 mL via INTRAVENOUS

## 2015-08-08 MED ORDER — ONDANSETRON HCL 4 MG/2ML IJ SOLN
4.0000 mg | Freq: Once | INTRAMUSCULAR | Status: AC
  Administered 2015-08-08: 4 mg via INTRAVENOUS
  Filled 2015-08-08: qty 2

## 2015-08-08 MED ORDER — HYDROMORPHONE HCL 1 MG/ML IJ SOLN
1.0000 mg | Freq: Once | INTRAMUSCULAR | Status: AC
  Administered 2015-08-08: 1 mg via INTRAVENOUS
  Filled 2015-08-08: qty 1

## 2015-08-08 MED ORDER — SODIUM CHLORIDE 0.9 % IV BOLUS (SEPSIS)
1000.0000 mL | Freq: Once | INTRAVENOUS | Status: AC
  Administered 2015-08-08: 1000 mL via INTRAVENOUS

## 2015-08-08 MED ORDER — LACTATED RINGERS IV SOLN
INTRAVENOUS | Status: DC
  Administered 2015-08-08: 10:00:00 via INTRAVENOUS

## 2015-08-08 MED ORDER — IOPAMIDOL (ISOVUE-300) INJECTION 61%: 100.0000 mL | Freq: Once | INTRAVENOUS | Status: DC | PRN

## 2015-08-08 MED ORDER — DIATRIZOATE MEGLUMINE & SODIUM 66-10 % PO SOLN
30.0000 mL | Freq: Once | ORAL | Status: AC
  Administered 2015-08-08: 30 mL via ORAL

## 2015-08-08 NOTE — ED Notes (Signed)
Patient ambulated to restroom.

## 2015-08-08 NOTE — ED Provider Notes (Signed)
Patient's care signed out to follow-up CT scan and reassess. Patient had outpatient gallbladder removed at [facility]. Patient is discharged after surgery and when she got home she is had worsening vomiting nausea and abdominal pain. Patient was tachycardic 1:30 on arrival. Clinically dehydrated. Multiple IV fluid boluses given. CT scan no acute findings. Discussed with hospitalist who did not feel comfortable accepting the patient since one day postop. Discussed with general surgery for consult to see the patient and the ER. Patient does not feel comfortable going back to [facility] the patient had surgery there because the diagnosis was made in that system. Patient does prefer [facility] and regularly comes here. Surgery recommended transfer to [facility].  Discussed with Dr Candiss Norse who accepted.  Repeat IV fluids and pain meds given. Patient improved on reassessment.  Ct Abdomen Pelvis Wo Contrast  08/08/2015  CLINICAL DATA:  Upper abdominal pain after cholecystectomy performed 08/07/2015, elevated white blood count EXAM: CT ABDOMEN AND PELVIS WITHOUT CONTRAST TECHNIQUE: Multidetector CT imaging of the abdomen and pelvis was performed following the standard protocol without IV contrast. COMPARISON:  05/26/2015 FINDINGS: Lower chest:  Mild bilateral lower lobe atelectasis Hepatobiliary: Status post cholecystectomy. Minimal postoperative change in the gallbladder fossa. No fluid collection in the region. Liver appears normal in its non contrasted state. Pancreas: Negative Spleen: Negative Adrenals/Urinary Tract: Negative Stomach/Bowel: Small bowel stomach and large bowel are normal. Vascular/Lymphatic: No significant abnormalities Reproductive: 5 cm I so attenuating mass arising off the left uterine fundus likely a fibroid. It is unchanged. Other: No free fluid in the abdomen or pelvis. Anticipated tract of air in the periumbilical region across the abdominal wall from recent laparoscopy with air down to the  abdominal wall musculature. These constitute anticipated postoperative findings. Musculoskeletal: No acute findings IMPRESSION: Anticipated postoperative appearance with no acute abnormalities Electronically Signed   By: Skipper Cliche M.D.   On: 08/08/2015 07:15   Dg Chest 2 View  07/15/2015  CLINICAL DATA:  [name] and chest pain EXAM: CHEST  2 VIEW COMPARISON:  05/25/2015 FINDINGS: The heart size and mediastinal contours are within normal limits. Both lungs are clear. The visualized skeletal structures are unremarkable. IMPRESSION: No active cardiopulmonary disease. Electronically Signed   By: Inez Catalina M.D.   On: 07/15/2015 17:28   Filed Vitals:   08/08/15 0924 08/08/15 1158  BP: 119/72 114/69  Pulse: 117 110  Temp: 98.2 F (36.8 C) 98.2 F (36.8 C)  Resp: 17 17     Elnora Morrison, MD 08/08/15 1253

## 2015-08-08 NOTE — ED Notes (Signed)
Pt states she had her gallbladder removed yesterday morning by Dr Candiss Norse at [facility] and tonight she started vomiting black/green stuff  Pt states she has a burning in her epigastric area and her abdomen hurts

## 2015-08-08 NOTE — ED Notes (Addendum)
Dr. Venora Maples notified of lactic acid 3.0

## 2015-08-08 NOTE — ED Notes (Signed)
Report called [phone]

## 2015-08-08 NOTE — ED Provider Notes (Signed)
[name]: [id number]     Arrival date & time 08/08/15  0315 History   First MD Initiated Contact with Patient 08/08/15 0356     Chief Complaint  Patient presents with  . Emesis  . Post-op Problem      HPI Patient presents the emergency department after a laparoscopic cholecystectomy on Thursday, 08/07/2015.  Technically she is postop day #1 now and reports severe nausea vomiting.  She was discharged from the outpatient surgical center at [facility] on 08/07/2015 at around 2 PM.  She states at that time she was feeling okay however shortly after returning home she developed nausea and vomiting.  She feels like there may be a few streaks of blood in her vomit.  She is not vomiting frank blood.  She continued to feel poorly at home and thus she came to the ER for evaluation.  She does live in [place].  Patient reports she has a burning epigastric pain in her upper abdomen.   Past Medical History  Diagnosis Date  . Diabetes mellitus   . Hypertension   . Stroke ([place]) 02/2013  . Pneumonia   . Gastric ulcer    Past Surgical History  Procedure Laterality Date  . No past surgeries    . Cataract extraction  01/2014  . Cholecystectomy     Family History  Problem Relation Age of Onset  . Diabetes Mother   . Diabetes Father   . Heart disease Father   . Diabetes Sister   . Diabetes Brother    Social History  Substance Use Topics  . Smoking status: Never Smoker   . Smokeless tobacco: Never Used  . Alcohol Use: No   OB History    No data available     Review of Systems  All other systems reviewed and are negative.     Allergies  Penicillins; Valium; Lisinopril; Food; and Morphine and related  Home Medications   Prior to Admission medications   Medication Sig Start Date End Date Taking? Authorizing Provider  albuterol (PROVENTIL HFA;VENTOLIN HFA) 108 (90 BASE) MCG/ACT inhaler Inhale 2 puffs into the lungs every 6 (six) hours as needed for wheezing or shortness of breath.     Historical Provider, MD  allopurinol (ZYLOPRIM) 100 MG tablet Take 100 mg by mouth daily. 02/14/15   Historical Provider, MD  amLODipine (NORVASC) 10 MG tablet Take 10 mg by mouth daily. 02/11/15   Historical Provider, MD  DULoxetine (CYMBALTA) 30 MG capsule Take 60 mg by mouth daily. 01/16/15   Historical Provider, MD  gabapentin (NEURONTIN) 300 MG capsule Take 300 mg by mouth 3 (three) times daily. 05/15/15   Historical Provider, MD  hydrochlorothiazide (HYDRODIURIL) 25 MG tablet Take 1 tablet (25 mg total) by mouth daily. 09/27/13   Haydee Monica, MD  HYDROcodone-acetaminophen (NORCO/VICODIN) 5-325 MG tablet Take 1 tablet by mouth every 6 (six) hours as needed for moderate pain. 07/15/15   Milton Ferguson, MD  metoprolol succinate (TOPROL-XL) 25 MG 24 hr tablet Take 25 mg by mouth daily.    Historical Provider, MD  Multiple Vitamins-Minerals (MULTIVITAMIN WITH MINERALS) tablet Take 1 tablet by mouth daily.    Historical Provider, MD  NOVOLOG MIX 70/30 (70-30) 100 UNIT/ML injection Inject 50-75 Units as directed 3 (three) times daily after meals. 75 units every morning, 50 units every afternoon, 75 units every night 02/17/15   Historical Provider, MD  ondansetron (ZOFRAN) 4 MG tablet Take 1 tablet (4 mg total) by mouth every 6 (six) hours.  07/15/15   Milton Ferguson, MD  pantoprazole (PROTONIX) 40 MG tablet Take 40 mg by mouth 2 (two) times daily. 05/23/15   Historical Provider, MD  pravastatin (PRAVACHOL) 10 MG tablet Take 10 mg by mouth daily. 05/22/15   Historical Provider, MD  predniSONE (DELTASONE) 10 MG tablet Take 3 tablets daily 2 days, then 2 tablets daily 2 days, then 1 tablet daily 2 days, then stop. Patient not taking: Reported on 07/15/2015 06/01/15   Modena Jansky, MD  ranitidine (ZANTAC) 150 MG tablet Take 150 mg by mouth daily as needed for heartburn.     Historical Provider, MD  Vitamin D, Ergocalciferol, (DRISDOL) 50000 UNITS CAPS capsule Take 50,000 Units by mouth every 7 (seven) days.     Historical Provider, MD   BP 119/76 mmHg  Pulse 118  Temp(Src) 98.2 F (36.8 C) (Oral)  Resp 22  Ht 5\' 6"  (1.676 m)  Wt 312 lb (141.522 kg)  BMI 50.38 kg/m2  SpO2 96% Physical Exam  Constitutional: She is oriented to person, place, and time. She appears well-developed and well-nourished. No distress.  HENT:  Head: Normocephalic and atraumatic.  Eyes: EOM are normal.  Neck: Normal range of motion.  Cardiovascular: Normal rate, regular rhythm and normal heart sounds.   Pulmonary/Chest: Effort normal and breath sounds normal.  Abdominal: Soft. She exhibits no distension.  Mild generalized upper abdominal tenderness.  No peritoneal signs.  Laparoscopic incisions appear intact without secondary signs of infection.  Musculoskeletal: Normal range of motion.  Neurological: She is alert and oriented to person, place, and time.  Skin: Skin is warm and dry.  Psychiatric: She has a normal mood and affect. Judgment normal.  Nursing note and vitals reviewed.   ED Course  Procedures (including critical care time) Labs Review Labs Reviewed  CBC WITH DIFFERENTIAL/PLATELET - Abnormal; Notable for the following:    WBC 12.9 (*)    RBC 3.50 (*)    Hemoglobin 10.4 (*)    HCT 31.4 (*)    Platelets 485 (*)    Neutro Abs 9.6 (*)    All other components within normal limits  COMPREHENSIVE METABOLIC PANEL - Abnormal; Notable for the following:    Chloride 93 (*)    Glucose, Bld 338 (*)    Creatinine, Ser 1.09 (*)    Total Protein 8.3 (*)    AST 43 (*)    GFR calc non Af Amer 58 (*)    All other components within normal limits  LIPASE, BLOOD - Abnormal; Notable for the following:    Lipase 56 (*)    All other components within normal limits  LACTIC ACID, PLASMA - Abnormal; Notable for the following:    Lactic Acid, Venous 3.0 (*)    All other components within normal limits    Imaging Review No results found. I have personally reviewed and evaluated these images and lab results as  part of my medical decision-making.   EKG Interpretation None      MDM   Final diagnoses:  Upper abdominal pain    Tachycardic on arrival however the patient is feeling extremely poor at this time with active vomiting while in the room.  No frank blood noted in her vomit.  She will undergo laboratory studies at this time as well as CT imaging of her abdomen.  This may represent simple postoperative ileus versus intra-abdominal, location from her laparoscopic surgery.  She is tachycardic and hopefully this will improve with IV fluids.  Rectal temp is  99.2.  Aggressive symptom control this time.  Patient will remain nothing by mouth  7:10 AM Care to Dr Reather Converse to follow up on CT imaging.  Patient with improving symptoms at this time.  Her lactate is mildly elevated at 3.0.  This will be repeated after additional IV fluids.    Jola Schmidt, MD 08/08/15 [phone]

## 2015-08-08 NOTE — Progress Notes (Signed)
Pt with ED visits x 4 and 1 admission in the last 6 months  PCP: PANNELL,CHRISTA, MD Admit date: 05/25/2015 Discharge date: 05/31/2015 Discharge Diagnoses:  Principal Problem:  DKA (diabetic ketoacidoses) ([place]) Active Problems:  Essential hypertension, benign  Uncontrolled type 2 diabetes mellitus with diabetic neuropathy, with long-term current use of insulin ([facility])  Dyslipidemia associated with type 2 diabetes mellitus ([facility])  CKD (chronic kidney disease) stage 3, GFR 30-59 ml/min  Leukocytosis  Nausea with vomiting  Abdominal pain  [name]  UTI (urinary tract infection)  Gout flare  Discharge Condition: Improved & Stable Support system Spouse

## 2015-08-20 ENCOUNTER — Encounter (HOSPITAL_COMMUNITY): Payer: Self-pay | Admitting: Emergency Medicine

## 2015-08-20 ENCOUNTER — Inpatient Hospital Stay (HOSPITAL_COMMUNITY)
Admission: EM | Admit: 2015-08-20 | Discharge: 2015-08-24 | DRG: 074 | Disposition: A | Discharge status: Home / Self Care | Payer: Medicaid Other | Attending: Internal Medicine | Admitting: Internal Medicine

## 2015-08-20 DIAGNOSIS — R197 Diarrhea, unspecified: Secondary | ICD-10-CM

## 2015-08-20 DIAGNOSIS — N182 Chronic kidney disease, stage 2 (mild): Secondary | ICD-10-CM | POA: Diagnosis present

## 2015-08-20 DIAGNOSIS — R1110 Vomiting, unspecified: Secondary | ICD-10-CM

## 2015-08-20 DIAGNOSIS — Z6841 Body Mass Index (BMI) 40.0 and over, adult: Secondary | ICD-10-CM

## 2015-08-20 DIAGNOSIS — Z880 Allergy status to penicillin: Secondary | ICD-10-CM

## 2015-08-20 DIAGNOSIS — R112 Nausea with vomiting, unspecified: Secondary | ICD-10-CM | POA: Diagnosis present

## 2015-08-20 DIAGNOSIS — Z8673 Personal history of transient ischemic attack (TIA), and cerebral infarction without residual deficits: Secondary | ICD-10-CM

## 2015-08-20 DIAGNOSIS — Z794 Long term (current) use of insulin: Secondary | ICD-10-CM | POA: Diagnosis present

## 2015-08-20 DIAGNOSIS — R1010 Upper abdominal pain, unspecified: Secondary | ICD-10-CM

## 2015-08-20 DIAGNOSIS — K3184 Gastroparesis: Secondary | ICD-10-CM | POA: Diagnosis present

## 2015-08-20 DIAGNOSIS — E1143 Type 2 diabetes mellitus with diabetic autonomic (poly)neuropathy: Principal | ICD-10-CM | POA: Diagnosis present

## 2015-08-20 DIAGNOSIS — N179 Acute kidney failure, unspecified: Secondary | ICD-10-CM

## 2015-08-20 DIAGNOSIS — E1122 Type 2 diabetes mellitus with diabetic chronic kidney disease: Secondary | ICD-10-CM | POA: Diagnosis present

## 2015-08-20 DIAGNOSIS — Z79891 Long term (current) use of opiate analgesic: Secondary | ICD-10-CM

## 2015-08-20 DIAGNOSIS — E119 Type 2 diabetes mellitus without complications: Secondary | ICD-10-CM | POA: Diagnosis present

## 2015-08-20 DIAGNOSIS — N184 Chronic kidney disease, stage 4 (severe): Secondary | ICD-10-CM | POA: Diagnosis present

## 2015-08-20 DIAGNOSIS — IMO0002 Reserved for concepts with insufficient information to code with codable children: Secondary | ICD-10-CM

## 2015-08-20 DIAGNOSIS — R109 Unspecified abdominal pain: Secondary | ICD-10-CM

## 2015-08-20 DIAGNOSIS — E0865 Diabetes mellitus due to underlying condition with hyperglycemia: Secondary | ICD-10-CM

## 2015-08-20 DIAGNOSIS — E1165 Type 2 diabetes mellitus with hyperglycemia: Secondary | ICD-10-CM | POA: Diagnosis present

## 2015-08-20 DIAGNOSIS — N1832 Chronic kidney disease, stage 3b: Secondary | ICD-10-CM | POA: Diagnosis present

## 2015-08-20 DIAGNOSIS — E1169 Type 2 diabetes mellitus with other specified complication: Secondary | ICD-10-CM

## 2015-08-20 DIAGNOSIS — R1115 Cyclical vomiting syndrome unrelated to migraine: Secondary | ICD-10-CM

## 2015-08-20 DIAGNOSIS — E785 Hyperlipidemia, unspecified: Secondary | ICD-10-CM | POA: Diagnosis present

## 2015-08-20 DIAGNOSIS — I129 Hypertensive chronic kidney disease with stage 1 through stage 4 chronic kidney disease, or unspecified chronic kidney disease: Secondary | ICD-10-CM | POA: Diagnosis present

## 2015-08-20 DIAGNOSIS — N183 Chronic kidney disease, stage 3 (moderate): Secondary | ICD-10-CM | POA: Diagnosis present

## 2015-08-20 DIAGNOSIS — E876 Hypokalemia: Secondary | ICD-10-CM | POA: Diagnosis present

## 2015-08-20 DIAGNOSIS — Z888 Allergy status to other drugs, medicaments and biological substances status: Secondary | ICD-10-CM

## 2015-08-20 DIAGNOSIS — Z79899 Other long term (current) drug therapy: Secondary | ICD-10-CM

## 2015-08-20 DIAGNOSIS — E1140 Type 2 diabetes mellitus with diabetic neuropathy, unspecified: Secondary | ICD-10-CM

## 2015-08-20 DIAGNOSIS — Z8711 Personal history of peptic ulcer disease: Secondary | ICD-10-CM

## 2015-08-20 LAB — COMPREHENSIVE METABOLIC PANEL
ALT: 19 U/L (ref 14–54)
AST: 22 U/L (ref 15–41)
Albumin: 4.3 g/dL (ref 3.5–5.0)
Alkaline Phosphatase: 86 U/L (ref 38–126)
Anion gap: 16 — ABNORMAL HIGH (ref 5–15)
BILIRUBIN TOTAL: 0.7 mg/dL (ref 0.3–1.2)
BUN: 18 mg/dL (ref 6–20)
CO2: 27 mmol/L (ref 22–32)
CREATININE: 1.32 mg/dL — AB (ref 0.44–1.00)
Calcium: 9.2 mg/dL (ref 8.9–10.3)
Chloride: 94 mmol/L — ABNORMAL LOW (ref 101–111)
GFR, EST AFRICAN AMERICAN: 53 mL/min — AB (ref >60)
GFR, EST NON AFRICAN AMERICAN: 46 mL/min — AB (ref >60)
Glucose, Bld: 332 mg/dL — ABNORMAL HIGH (ref 65–99)
POTASSIUM: 2.7 mmol/L — AB (ref 3.5–5.1)
Sodium: 137 mmol/L (ref 135–145)
TOTAL PROTEIN: 8.9 g/dL — AB (ref 6.5–8.1)

## 2015-08-20 LAB — CREATININE, URINE, RANDOM: Creatinine, Urine: 127.92 mg/dL

## 2015-08-20 LAB — URINE MICROSCOPIC-ADD ON

## 2015-08-20 LAB — URINALYSIS, ROUTINE W REFLEX MICROSCOPIC
Bilirubin Urine: NEGATIVE
Glucose, UA: >1000 mg/dL — AB
Ketones, ur: 40 mg/dL — AB
Leukocytes, UA: NEGATIVE
Nitrite: NEGATIVE
Protein, ur: 100 mg/dL — AB
Specific Gravity, Urine: 1.026 (ref 1.005–1.030)
pH: 5.5 (ref 5.0–8.0)

## 2015-08-20 LAB — RAPID URINE DRUG SCREEN, HOSP PERFORMED
Amphetamines: NOT DETECTED
Barbiturates: NOT DETECTED
Benzodiazepines: NOT DETECTED
COCAINE: NOT DETECTED
OPIATES: POSITIVE — AB
Tetrahydrocannabinol: NOT DETECTED

## 2015-08-20 LAB — SODIUM, URINE, RANDOM: Sodium, Ur: 103 mmol/L

## 2015-08-20 LAB — PROTEIN, URINE, RANDOM: Total Protein, Urine: 110 mg/dL

## 2015-08-20 LAB — CBC
HCT: 36.6 % (ref 36.0–46.0)
Hemoglobin: 11.9 g/dL — ABNORMAL LOW (ref 12.0–15.0)
MCH: 29 pg (ref 26.0–34.0)
MCHC: 32.5 g/dL (ref 30.0–36.0)
MCV: 89.1 fL (ref 78.0–100.0)
PLATELETS: 463 10*3/uL — AB (ref 150–400)
RBC: 4.11 MIL/uL (ref 3.87–5.11)
RDW: 14.1 % (ref 11.5–15.5)
WBC: 14.5 10*3/uL — AB (ref 4.0–10.5)

## 2015-08-20 LAB — TROPONIN I: Troponin I: <0.03 ng/mL (ref <0.031)

## 2015-08-20 LAB — GLUCOSE, CAPILLARY
Glucose-Capillary: 336 mg/dL — ABNORMAL HIGH (ref 65–99)
Glucose-Capillary: 336 mg/dL — ABNORMAL HIGH (ref 65–99)

## 2015-08-20 LAB — CBG MONITORING, ED: GLUCOSE-CAPILLARY: 355 mg/dL — AB (ref 65–99)

## 2015-08-20 LAB — PHOSPHORUS: Phosphorus: 1.8 mg/dL — ABNORMAL LOW (ref 2.5–4.6)

## 2015-08-20 LAB — MAGNESIUM: Magnesium: 1.4 mg/dL — ABNORMAL LOW (ref 1.7–2.4)

## 2015-08-20 LAB — LIPASE, BLOOD: Lipase: 23 U/L (ref 11–51)

## 2015-08-20 MED ORDER — MAGNESIUM SULFATE 2 GM/50ML IV SOLN
2.0000 g | Freq: Once | INTRAVENOUS | Status: AC
  Administered 2015-08-20: 2 g via INTRAVENOUS
  Filled 2015-08-20: qty 50

## 2015-08-20 MED ORDER — SODIUM CHLORIDE 0.9 % IV SOLN: INTRAVENOUS | Status: DC

## 2015-08-20 MED ORDER — FENTANYL CITRATE (PF) 100 MCG/2ML IJ SOLN
50.0000 ug | INTRAMUSCULAR | Status: AC | PRN
  Administered 2015-08-20 (×2): 50 ug via INTRAVENOUS
  Filled 2015-08-20 (×3): qty 2

## 2015-08-20 MED ORDER — POTASSIUM CHLORIDE IN NACL 20-0.9 MEQ/L-% IV SOLN
INTRAVENOUS | Status: DC
  Administered 2015-08-20 – 2015-08-23 (×7): via INTRAVENOUS
  Filled 2015-08-20 (×8): qty 1000

## 2015-08-20 MED ORDER — INSULIN ASPART 100 UNIT/ML ~~LOC~~ SOLN
0.0000 [IU] | Freq: Three times a day (TID) | SUBCUTANEOUS | Status: DC
  Administered 2015-08-20 – 2015-08-21 (×2): 7 [IU] via SUBCUTANEOUS

## 2015-08-20 MED ORDER — AMLODIPINE BESYLATE 10 MG PO TABS
10.0000 mg | ORAL_TABLET | Freq: Every day | ORAL | Status: DC
  Administered 2015-08-22 – 2015-08-24 (×3): 10 mg via ORAL
  Filled 2015-08-20 (×4): qty 1

## 2015-08-20 MED ORDER — SENNOSIDES 8.6 MG PO TABS: 1.0000 | ORAL_TABLET | Freq: Two times a day (BID) | ORAL | Status: DC

## 2015-08-20 MED ORDER — SODIUM CHLORIDE 0.9 % IV BOLUS (SEPSIS)
1000.0000 mL | Freq: Once | INTRAVENOUS | Status: AC
  Administered 2015-08-20: 1000 mL via INTRAVENOUS

## 2015-08-20 MED ORDER — INSULIN GLARGINE 100 UNIT/ML ~~LOC~~ SOLN
10.0000 [IU] | Freq: Every day | SUBCUTANEOUS | Status: DC
  Administered 2015-08-20: 10 [IU] via SUBCUTANEOUS
  Filled 2015-08-20 (×2): qty 0.1

## 2015-08-20 MED ORDER — ONDANSETRON HCL 4 MG/2ML IJ SOLN
4.0000 mg | Freq: Three times a day (TID) | INTRAMUSCULAR | Status: AC | PRN
  Administered 2015-08-20: 4 mg via INTRAVENOUS
  Filled 2015-08-20: qty 2

## 2015-08-20 MED ORDER — FAMOTIDINE IN NACL 20-0.9 MG/50ML-% IV SOLN
20.0000 mg | Freq: Once | INTRAVENOUS | Status: AC
  Administered 2015-08-20: 20 mg via INTRAVENOUS
  Filled 2015-08-20: qty 50

## 2015-08-20 MED ORDER — DULOXETINE HCL 30 MG PO CPEP
30.0000 mg | ORAL_CAPSULE | Freq: Two times a day (BID) | ORAL | Status: DC
  Administered 2015-08-22 – 2015-08-24 (×5): 30 mg via ORAL
  Filled 2015-08-20 (×7): qty 1

## 2015-08-20 MED ORDER — ONDANSETRON HCL 4 MG/2ML IJ SOLN
4.0000 mg | Freq: Once | INTRAMUSCULAR | Status: DC
  Filled 2015-08-20: qty 2

## 2015-08-20 MED ORDER — CARVEDILOL 6.25 MG PO TABS
6.2500 mg | ORAL_TABLET | Freq: Two times a day (BID) | ORAL | Status: DC
  Administered 2015-08-21 – 2015-08-24 (×8): 6.25 mg via ORAL
  Filled 2015-08-20 (×8): qty 1

## 2015-08-20 MED ORDER — ALLOPURINOL 100 MG PO TABS
100.0000 mg | ORAL_TABLET | Freq: Every day | ORAL | Status: DC
  Administered 2015-08-22 – 2015-08-24 (×3): 100 mg via ORAL
  Filled 2015-08-20 (×4): qty 1

## 2015-08-20 MED ORDER — ALBUTEROL SULFATE (2.5 MG/3ML) 0.083% IN NEBU: 2.5000 mg | INHALATION_SOLUTION | Freq: Four times a day (QID) | RESPIRATORY_TRACT | Status: DC | PRN

## 2015-08-20 MED ORDER — FENTANYL CITRATE (PF) 100 MCG/2ML IJ SOLN
50.0000 ug | Freq: Once | INTRAMUSCULAR | Status: AC
  Administered 2015-08-20: 50 ug via INTRAVENOUS
  Filled 2015-08-20: qty 2

## 2015-08-20 MED ORDER — POTASSIUM CHLORIDE 10 MEQ/100ML IV SOLN
10.0000 meq | INTRAVENOUS | Status: AC
  Administered 2015-08-20 (×3): 10 meq via INTRAVENOUS
  Filled 2015-08-20 (×3): qty 100

## 2015-08-20 MED ORDER — ENOXAPARIN SODIUM 40 MG/0.4ML ~~LOC~~ SOLN: 40.0000 mg | SUBCUTANEOUS | Status: DC

## 2015-08-20 MED ORDER — METOCLOPRAMIDE HCL 5 MG/ML IJ SOLN
10.0000 mg | Freq: Once | INTRAMUSCULAR | Status: AC
  Administered 2015-08-20: 10 mg via INTRAVENOUS
  Filled 2015-08-20: qty 2

## 2015-08-20 MED ORDER — OXYCODONE HCL 5 MG PO TABS: 5.0000 mg | ORAL_TABLET | Freq: Three times a day (TID) | ORAL | Status: DC | PRN

## 2015-08-20 MED ORDER — HYDROMORPHONE HCL 1 MG/ML IJ SOLN
1.0000 mg | Freq: Once | INTRAMUSCULAR | Status: AC
  Administered 2015-08-20: 1 mg via INTRAVENOUS
  Filled 2015-08-20: qty 1

## 2015-08-20 MED ORDER — HYDROMORPHONE HCL 1 MG/ML IJ SOLN
0.5000 mg | Freq: Once | INTRAMUSCULAR | Status: AC
  Administered 2015-08-20: 0.5 mg via INTRAVENOUS
  Filled 2015-08-20: qty 1

## 2015-08-20 MED ORDER — DOCUSATE SODIUM 100 MG PO CAPS
100.0000 mg | ORAL_CAPSULE | Freq: Two times a day (BID) | ORAL | Status: DC
  Administered 2015-08-22 – 2015-08-24 (×4): 100 mg via ORAL
  Filled 2015-08-20 (×6): qty 1

## 2015-08-20 MED ORDER — HEPARIN SODIUM (PORCINE) 5000 UNIT/ML IJ SOLN
5000.0000 [IU] | Freq: Two times a day (BID) | INTRAMUSCULAR | Status: DC
  Administered 2015-08-20 – 2015-08-24 (×8): 5000 [IU] via SUBCUTANEOUS
  Filled 2015-08-20 (×8): qty 1

## 2015-08-20 MED ORDER — PRAVASTATIN SODIUM 10 MG PO TABS
10.0000 mg | ORAL_TABLET | Freq: Every day | ORAL | Status: DC
  Filled 2015-08-20: qty 1

## 2015-08-20 MED ORDER — ONDANSETRON HCL 4 MG/2ML IJ SOLN: 4.0000 mg | Freq: Three times a day (TID) | INTRAMUSCULAR | Status: DC | PRN

## 2015-08-20 MED ORDER — VITAMIN D (ERGOCALCIFEROL) 1.25 MG (50000 UNIT) PO CAPS
50000.0000 [IU] | ORAL_CAPSULE | ORAL | Status: DC
  Filled 2015-08-20: qty 1

## 2015-08-20 MED ORDER — SUCRALFATE 1 G PO TABS
1.0000 g | ORAL_TABLET | Freq: Four times a day (QID) | ORAL | Status: DC
  Administered 2015-08-21 – 2015-08-24 (×13): 1 g via ORAL
  Filled 2015-08-20 (×19): qty 1

## 2015-08-20 MED ORDER — METOCLOPRAMIDE HCL 5 MG/ML IJ SOLN
5.0000 mg | Freq: Four times a day (QID) | INTRAMUSCULAR | Status: DC
  Administered 2015-08-20 – 2015-08-21 (×3): 5 mg via INTRAVENOUS
  Filled 2015-08-20 (×3): qty 2

## 2015-08-20 MED ORDER — ALBUTEROL SULFATE HFA 108 (90 BASE) MCG/ACT IN AERS: 2.0000 | INHALATION_SPRAY | Freq: Four times a day (QID) | RESPIRATORY_TRACT | Status: DC | PRN

## 2015-08-20 MED ORDER — ONDANSETRON HCL 4 MG/2ML IJ SOLN
4.0000 mg | Freq: Once | INTRAMUSCULAR | Status: AC | PRN
  Administered 2015-08-20: 4 mg via INTRAVENOUS
  Filled 2015-08-20 (×2): qty 2

## 2015-08-20 MED ORDER — MAGNESIUM SULFATE 50 % IJ SOLN
2.0000 g | Freq: Once | INTRAMUSCULAR | Status: DC
  Filled 2015-08-20: qty 4

## 2015-08-20 MED ORDER — PANTOPRAZOLE SODIUM 40 MG PO TBEC
40.0000 mg | DELAYED_RELEASE_TABLET | Freq: Two times a day (BID) | ORAL | Status: DC
  Administered 2015-08-21 – 2015-08-24 (×6): 40 mg via ORAL
  Filled 2015-08-20 (×8): qty 1

## 2015-08-20 MED ORDER — SENNA 8.6 MG PO TABS
1.0000 | ORAL_TABLET | Freq: Two times a day (BID) | ORAL | Status: DC
  Administered 2015-08-22 – 2015-08-24 (×4): 8.6 mg via ORAL
  Filled 2015-08-20 (×6): qty 1

## 2015-08-20 MED ORDER — PROCHLORPERAZINE EDISYLATE 5 MG/ML IJ SOLN
5.0000 mg | Freq: Once | INTRAMUSCULAR | Status: AC
  Administered 2015-08-20: 5 mg via INTRAVENOUS
  Filled 2015-08-20: qty 2

## 2015-08-20 NOTE — ED Provider Notes (Addendum)
[name]: [id number]     Arrival date & time 08/20/15  1146 History   First MD Initiated Contact with Patient 08/20/15 1324     Chief Complaint  Patient presents with  . Nausea  . Hematemesis  . Abdominal Pain     (Consider location/radiation/quality/duration/timing/severity/associated sxs/prior Treatment) Patient is a 50 y.o. female presenting with abdominal pain. The history is provided by the patient.  Abdominal Pain Associated symptoms: nausea and vomiting   Associated symptoms: no chest pain, no chills, no dysuria, no fever, no shortness of breath and no sore throat   Patient c/o upper abd pain, epigastric pain, lower sternal/midline chest pain for the past 2 days. Symptoms persistent/constant since onset, no acute or abrupt change today. Pain constant, dull, burning, non radiating. No specific exacerbating or alleviating factors. Nausea. Vomiting x 3-4 today, not bloody or bilious. No abd distension. Normal bm yesterday.  Had lap chole 6/1, denies complication.  Patient states similar symptoms on chronic basis prior to gallbladder surgery, and no improvement since. Denies dysuria or gu c/o. No cough or uri c/o. No sob. No other recent chest pain or discomfort even w exertion. No unusual doe or fatigue. Denies personal hx cad or fam hx premature cad. No back/flank pain.      Past Medical History  Diagnosis Date  . Diabetes mellitus   . Hypertension   . Stroke ([place]) 02/2013  . Pneumonia   . Gastric ulcer    Past Surgical History  Procedure Laterality Date  . No past surgeries    . Cataract extraction  01/2014  . Cholecystectomy     Family History  Problem Relation Age of Onset  . Diabetes Mother   . Diabetes Father   . Heart disease Father   . Diabetes Sister   . Diabetes Brother    Social History  Substance Use Topics  . Smoking status: Never Smoker   . Smokeless tobacco: Never Used  . Alcohol Use: No   OB History    No data available     Review of Systems   Constitutional: Negative for fever and chills.  HENT: Negative for sore throat.   Eyes: Negative for redness.  Respiratory: Negative for shortness of breath.   Cardiovascular: Negative for chest pain.  Gastrointestinal: Positive for nausea, vomiting and abdominal pain.  Endocrine: Negative for polyuria.  Genitourinary: Negative for dysuria and flank pain.  Musculoskeletal: Negative for back pain and neck pain.  Skin: Negative for rash.  Neurological: Negative for headaches.  Hematological: Does not bruise/bleed easily.  Psychiatric/Behavioral: Negative for confusion.      Allergies  Penicillins; Valium; Lisinopril; Food; and Morphine and related  Home Medications   Prior to Admission medications   Medication Sig Start Date End Date Taking? Authorizing Provider  albuterol (PROVENTIL HFA;VENTOLIN HFA) 108 (90 BASE) MCG/ACT inhaler Inhale 2 puffs into the lungs every 6 (six) hours as needed for wheezing or shortness of breath.    Historical Provider, MD  allopurinol (ZYLOPRIM) 100 MG tablet Take 100 mg by mouth daily. 02/14/15   Historical Provider, MD  amLODipine (NORVASC) 10 MG tablet Take 10 mg by mouth daily. 02/11/15   Historical Provider, MD  docusate sodium (COLACE) 100 MG capsule Take 100 mg by mouth 2 (two) times daily. 08/07/15 08/22/15  Historical Provider, MD  DULoxetine (CYMBALTA) 30 MG capsule Take 30 mg by mouth 2 (two) times daily.  01/16/15   Historical Provider, MD  hydrochlorothiazide (HYDRODIURIL) 25 MG tablet Take 1 tablet (25  mg total) by mouth daily. 09/27/13   Haydee Monica, MD  HYDROcodone-acetaminophen (NORCO/VICODIN) 5-325 MG tablet Take 1 tablet by mouth every 6 (six) hours as needed for moderate pain. Patient not taking: Reported on 08/08/2015 07/15/15   Milton Ferguson, MD  metoprolol succinate (TOPROL-XL) 25 MG 24 hr tablet Take 25 mg by mouth daily.    Historical Provider, MD  Multiple Vitamins-Minerals (MULTIVITAMIN WITH MINERALS) tablet Take 1 tablet by mouth  daily.    Historical Provider, MD  NOVOLOG MIX 70/30 (70-30) 100 UNIT/ML injection Inject 50-75 Units as directed 3 (three) times daily after meals. 75 units every morning, 60 units with lunch, and 50 units with dinner 02/17/15   Historical Provider, MD  ondansetron (ZOFRAN) 4 MG tablet Take 1 tablet (4 mg total) by mouth every 6 (six) hours. Patient not taking: Reported on 08/08/2015 07/15/15   Milton Ferguson, MD  oxyCODONE (ROXICODONE) 5 MG immediate release tablet Take 5 mg by mouth every 8 (eight) hours as needed for moderate pain.  08/07/15 08/06/16  Historical Provider, MD  pantoprazole (PROTONIX) 40 MG tablet Take 40 mg by mouth 2 (two) times daily. 05/23/15   Historical Provider, MD  pravastatin (PRAVACHOL) 10 MG tablet Take 10 mg by mouth daily. 05/22/15   Historical Provider, MD  predniSONE (DELTASONE) 10 MG tablet Take 3 tablets daily 2 days, then 2 tablets daily 2 days, then 1 tablet daily 2 days, then stop. Patient not taking: Reported on 07/15/2015 06/01/15   Modena Jansky, MD  ranitidine (ZANTAC) 150 MG tablet Take 150 mg by mouth daily as needed for heartburn.     Historical Provider, MD  senna (SENOKOT) 8.6 MG tablet Take 1 tablet by mouth 2 (two) times daily. 08/07/15 08/06/16  Historical Provider, MD  sucralfate (CARAFATE) 1 g tablet Take 1 g by mouth 4 (four) times daily. 07/16/15   Historical Provider, MD  Vitamin D, Ergocalciferol, (DRISDOL) 50000 UNITS CAPS capsule Take 50,000 Units by mouth every 7 (seven) days.    Historical Provider, MD   BP 125/47 mmHg  Pulse 120  Temp(Src)   Resp 16  Ht 5\' 8"  (1.727 m)  Wt 141.522 kg  BMI 47.45 kg/m2  SpO2 94% Physical Exam  Constitutional: She appears well-developed and well-nourished. No distress.  Anxious appearing.   HENT:  Mouth/Throat: Oropharynx is clear and moist.  Eyes: Conjunctivae are normal. No scleral icterus.  Neck: Neck supple. No tracheal deviation present.  Cardiovascular: Regular rhythm, normal heart sounds and intact  distal pulses.  Exam reveals no gallop and no friction rub.   No murmur heard. Pulmonary/Chest: Effort normal and breath sounds normal. No respiratory distress.  Abdominal: Soft. Normal appearance and bowel sounds are normal. She exhibits no distension and no mass. There is tenderness. There is no rebound and no guarding.  Mild upper abd tenderness. Healing surgical incisions without sign of infection or incarc hernia.   Genitourinary:  No cva tenderness  Musculoskeletal: She exhibits no edema.  Neurological: She is alert.  Skin: Skin is warm and dry. No rash noted.  Psychiatric: She has a normal mood and affect.  Nursing note and vitals reviewed.   ED Course  Procedures (including critical care time) Labs Review  Results for orders placed or performed during the hospital encounter of 08/20/15  Lipase, blood  Result Value Ref Range   Lipase 23 11 - 51 U/L  Comprehensive metabolic panel  Result Value Ref Range   Sodium 137 135 - 145 mmol/L  Potassium 2.7 (LL) 3.5 - 5.1 mmol/L   Chloride 94 (L) 101 - 111 mmol/L   CO2 27 22 - 32 mmol/L   Glucose, Bld 332 (H) 65 - 99 mg/dL   BUN 18 6 - 20 mg/dL   Creatinine, Ser 1.32 (H) 0.44 - 1.00 mg/dL   Calcium 9.2 8.9 - 10.3 mg/dL   Total Protein 8.9 (H) 6.5 - 8.1 g/dL   Albumin 4.3 3.5 - 5.0 g/dL   AST 22 15 - 41 U/L   ALT 19 14 - 54 U/L   Alkaline Phosphatase 86 38 - 126 U/L   Total Bilirubin 0.7 0.3 - 1.2 mg/dL   GFR calc non Af Amer 46 (L) >60 mL/min   GFR calc Af Amer 53 (L) >60 mL/min   Anion gap 16 (H) 5 - 15  CBC  Result Value Ref Range   WBC 14.5 (H) 4.0 - 10.5 K/uL   RBC 4.11 3.87 - 5.11 MIL/uL   Hemoglobin 11.9 (L) 12.0 - 15.0 g/dL   HCT 36.6 36.0 - 46.0 %   MCV 89.1 78.0 - 100.0 fL   MCH 29.0 26.0 - 34.0 pg   MCHC 32.5 30.0 - 36.0 g/dL   RDW 14.1 11.5 - 15.5 %   Platelets 463 (H) 150 - 400 K/uL  CBG monitoring, ED  Result Value Ref Range   Glucose-Capillary 355 (H) 65 - 99 mg/dL   Ct Abdomen Pelvis Wo  Contrast  08/08/2015  CLINICAL DATA:  Upper abdominal pain after cholecystectomy performed 08/07/2015, elevated white blood count EXAM: CT ABDOMEN AND PELVIS WITHOUT CONTRAST TECHNIQUE: Multidetector CT imaging of the abdomen and pelvis was performed following the standard protocol without IV contrast. COMPARISON:  05/26/2015 FINDINGS: Lower chest:  Mild bilateral lower lobe atelectasis Hepatobiliary: Status post cholecystectomy. Minimal postoperative change in the gallbladder fossa. No fluid collection in the region. Liver appears normal in its non contrasted state. Pancreas: Negative Spleen: Negative Adrenals/Urinary Tract: Negative Stomach/Bowel: Small bowel stomach and large bowel are normal. Vascular/Lymphatic: No significant abnormalities Reproductive: 5 cm I so attenuating mass arising off the left uterine fundus likely a fibroid. It is unchanged. Other: No free fluid in the abdomen or pelvis. Anticipated tract of air in the periumbilical region across the abdominal wall from recent laparoscopy with air down to the abdominal wall musculature. These constitute anticipated postoperative findings. Musculoskeletal: No acute findings IMPRESSION: Anticipated postoperative appearance with no acute abnormalities Electronically Signed   By: Skipper Cliche M.D.   On: 08/08/2015 07:15        I have personally reviewed and evaluated these images and lab results as part of my medical decision-making.   EKG Interpretation   Date/Time:  Wednesday August 20 2015 11:54:25 EDT Ventricular Rate:  139 PR Interval:  164 QRS Duration: 88 QT Interval:  292 QTC Calculation: 444 R Axis:   -35 Text Interpretation:  Sinus tachycardia Left axis deviation Borderline T  wave abnormalities Baseline wander in lead(s) II III aVL aVF Confirmed by  Ashok Cordia  MD, Lennette Bihari ([id number]) on 08/20/2015 1:40:59 PM      MDM   Iv ns. Labs sent.  k low. Magnesium added to labs.  Reviewed nursing notes and prior charts for additional  history.   kcl iv. Dilaudid 1 mg iv for pain. reglan iv. pepcid iv.   Persistent nausea, unable to tolerate po.  Additional zofran iv.   Additional ivf.  Hr mildly improved.  Given diabetes, uncontrolled sugars, v low k, will admit for  hydration, k replacement.  On review records, patient appears to have chronic/recurrent, abd pain with nv syndrome, that does not appear conclusively to be related to gallbladder dysfunction (although pt did have abnormal hida scan), and/or be expected to resolve with cholecystectomy. ?whether more likely related to chronic uncontrolled diabetes, gastroparesis, or other recurrent nv syndrome.   Hospitalists consulted for admission.  Discussed with Dr Marthenia Rolling - requests temp orders to tele bed, obs status.   Mg low, mg ivpb.      Lajean Saver, MD 08/20/15 [phone]

## 2015-08-20 NOTE — ED Notes (Signed)
As i was attempting pts VS pt keeps sticking hand down throat in order to make herself vomit and will not sit still in order to obtain a BP

## 2015-08-20 NOTE — ED Notes (Signed)
Patient states she feels like she is going to pass out. CBG taken by NT.

## 2015-08-20 NOTE — ED Notes (Signed)
Patient states she is still unable to give urine sample.

## 2015-08-20 NOTE — ED Notes (Signed)
Informed RN of abnormal lab 

## 2015-08-20 NOTE — H&P (Signed)
History and Physical  Deborah [name] [id number] DOB: 01/30/1966 DOA: 08/20/2015  Referring physician: ER Physician PCP: Ernest Haber, MD  Outpatient Specialists:    Patient coming from: Home  Chief Complaint: Nausea, vomiting and diarrhea  HPI: 50 year old female with history DM, hypertension, CVA, pneumonia and gastric ulcer. Patient has had problems with recurrent nausea, vomiting and abdominal pain. Patient underwent cholecystectomy about 2 weeks ago. Patient presents with one day history of severe nausea, vomiting and diarrhea with associated abdominal pain. Patient tells me that the diarrhea has resolved. No sick contacts. No fever or chills. No headache, no neck pain, no chest pain, no urinary symptoms. Potassium is 2.7, magnesium of 1.4 and mild elevation of serum creatinine noted.  ED Course: Volume resuscitation  Pertinent labs: As above.  Review of Systems: As in HPI. Negative for fever, visual changes, sore throat, rash, new muscle aches, chest pain, SOB, dysuria, bleeding.  Past Medical History  Diagnosis Date  . Diabetes mellitus   . Hypertension   . Stroke ([place]) 02/2013  . Pneumonia   . Gastric ulcer     Past Surgical History  Procedure Laterality Date  . No past surgeries    . Cataract extraction  01/2014  . Cholecystectomy       reports that she has never smoked. She has never used smokeless tobacco. She reports that she does not drink alcohol or use illicit drugs.  Allergies  Allergen Reactions  . Penicillins Palpitations    Has patient had a PCN reaction causing immediate rash, facial/tongue/throat swelling, SOB or lightheadedness with hypotension: Yes Has patient had a PCN reaction causing severe rash involving mucus membranes or skin necrosis: No Has patient had a PCN reaction that required hospitalization: Yes  Has patient had a PCN reaction occurring within the last 10 years: No   . Valium [Diazepam] [name]  . Lisinopril  Swelling    Tongue and mouth swelling  . Food Hives and Swelling    Carrots, ketchup   . Morphine And Related     Family History  Problem Relation Age of Onset  . Diabetes Mother   . Diabetes Father   . Heart disease Father   . Diabetes Sister   . Diabetes Brother      Prior to Admission medications   Medication Sig Start Date End Date Taking? Authorizing Provider  albuterol (PROVENTIL HFA;VENTOLIN HFA) 108 (90 BASE) MCG/ACT inhaler Inhale 2 puffs into the lungs every 6 (six) hours as needed for wheezing or shortness of breath.   Yes Historical Provider, MD  allopurinol (ZYLOPRIM) 100 MG tablet Take 100 mg by mouth daily. 02/14/15  Yes Historical Provider, MD  amLODipine (NORVASC) 10 MG tablet Take 10 mg by mouth daily. 02/11/15  Yes Historical Provider, MD  docusate sodium (COLACE) 100 MG capsule Take 100 mg by mouth 2 (two) times daily. 08/07/15 08/22/15 Yes Historical Provider, MD  DULoxetine (CYMBALTA) 30 MG capsule Take 30 mg by mouth 2 (two) times daily.  01/16/15  Yes Historical Provider, MD  hydrochlorothiazide (HYDRODIURIL) 25 MG tablet Take 1 tablet (25 mg total) by mouth daily. 09/27/13  Yes Radhika P Phadke, MD  metoprolol succinate (TOPROL-XL) 25 MG 24 hr tablet Take 25 mg by mouth daily.   Yes Historical Provider, MD  NOVOLOG MIX 70/30 (70-30) 100 UNIT/ML injection Inject 50-75 Units as directed 3 (three) times daily after meals. 75 units every morning, 60 units with lunch, and 50 units with dinner 02/17/15  Yes Historical  Provider, MD  oxyCODONE (ROXICODONE) 5 MG immediate release tablet Take 5 mg by mouth every 8 (eight) hours as needed for moderate pain.  08/07/15 08/06/16 Yes Historical Provider, MD  pantoprazole (PROTONIX) 40 MG tablet Take 40 mg by mouth 2 (two) times daily. 05/23/15  Yes Historical Provider, MD  pravastatin (PRAVACHOL) 10 MG tablet Take 10 mg by mouth daily. 05/22/15  Yes Historical Provider, MD  senna (SENOKOT) 8.6 MG tablet Take 1 tablet by mouth 2 (two)  times daily. 08/07/15 08/06/16 Yes Historical Provider, MD  sucralfate (CARAFATE) 1 g tablet Take 1 g by mouth 4 (four) times daily. 07/16/15  Yes Historical Provider, MD  ranitidine (ZANTAC) 150 MG tablet Take 150 mg by mouth daily as needed for heartburn.     Historical Provider, MD  Vitamin D, Ergocalciferol, (DRISDOL) 50000 UNITS CAPS capsule Take 50,000 Units by mouth every 7 (seven) days.    Historical Provider, MD    Physical Exam: Filed Vitals:   08/20/15 1430 08/20/15 1500 08/20/15 1502 08/20/15 1530  BP: 105/64 119/75  139/92  Pulse:  118    Resp: 17 28 14 19   Height:      Weight:      SpO2: 100% 100% 100% 100%    Constitutional:  . Morbidly obese. Appears calm and comfortable Eyes:  . Pallor. No jaundice.  ENMT:  . Dry buccal Mucosa. [name] external ears, nose appear normal Neck:  . Neck is supple. No JVD Respiratory:  . CTA bilaterally, no w/r/r.  . Respiratory effort normal. No retractions or accessory muscle use Cardiovascular:  . S1S2 . No LE extremity edema   Abdomen:  . Abdomen is morbidly obese, soft and non tender. Organs are difficult to assess. Neurologic:  . Awake and alert. . Moves all limbs.  Wt Readings from Last 3 Encounters:  08/20/15 141.522 kg (312 lb)  08/08/15 141.522 kg (312 lb)  05/26/15 145.151 kg (320 lb)    I have personally reviewed following labs and imaging studies  Labs on Admission:  CBC:  Recent Labs Lab 08/20/15 1208  WBC 14.5*  HGB 11.9*  HCT 36.6  MCV 89.1  PLT [id number]*   Basic Metabolic Panel:  Recent Labs Lab 08/20/15 1208 08/20/15 1437  NA 137  --   K 2.7*  --   CL 94*  --   CO2 27  --   GLUCOSE 332*  --   BUN 18  --   CREATININE 1.32*  --   CALCIUM 9.2  --   MG  --  1.4*   Liver Function Tests:  Recent Labs Lab 08/20/15 1208  AST 22  ALT 19  ALKPHOS 86  BILITOT 0.7  PROT 8.9*  ALBUMIN 4.3    Recent Labs Lab 08/20/15 1208  LIPASE 23   No results for input(s): AMMONIA in the last 168  hours. Coagulation Profile: No results for input(s): INR, PROTIME in the last 168 hours. Cardiac Enzymes:  Recent Labs Lab 08/20/15 1437  TROPONINI <0.03   BNP (last 3 results) No results for input(s): PROBNP in the last 8760 hours. HbA1C: No results for input(s): HGBA1C in the last 72 hours. CBG:  Recent Labs Lab 08/20/15 1246  GLUCAP 355*   Lipid Profile: No results for input(s): CHOL, HDL, LDLCALC, TRIG, CHOLHDL, LDLDIRECT in the last 72 hours. Thyroid Function Tests: No results for input(s): TSH, T4TOTAL, FREET4, T3FREE, THYROIDAB in the last 72 hours. Anemia Panel: No results for input(s): VITAMINB12, FOLATE, FERRITIN, TIBC, IRON, RETICCTPCT  in the last 72 hours. Urine analysis:    Component Value Date/Time   COLORURINE YELLOW 05/29/2015 0650   APPEARANCEUR CLEAR 05/29/2015 0650   LABSPEC 1.030 05/29/2015 0650   PHURINE 5.0 05/29/2015 0650   GLUCOSEU >1000* 05/29/2015 0650   HGBUR TRACE* 05/29/2015 0650   BILIRUBINUR NEGATIVE 05/29/2015 0650   KETONESUR NEGATIVE 05/29/2015 0650   PROTEINUR 30* 05/29/2015 0650   UROBILINOGEN 0.2 10/02/2013 2108   NITRITE NEGATIVE 05/29/2015 0650   LEUKOCYTESUR NEGATIVE 05/29/2015 0650   Sepsis Labs: @LABRCNTIP (procalcitonin:4,lacticidven:4) )No results found for this or any previous visit (from the past 240 hour(s)).    Radiological Exams on Admission: No results found.  EKG: Independently reviewed.   Active Problems:   Hypokalemia   Hypomagnesemia   Nausea and vomiting   Assessment/Plan 1. Nausea and vomiting 2. diarrhea 3. Hypokalemia 4. Hypomagnesemia 5. DM, uncontrolled 6. AKI   Admit patient for observation  Supportive care  IV Fluids  IV magnesium  IV KCL  IV Reglan  SSI and insulin coverage  HbA1c  Monitor electrolytes and replete  AKI work up  DVT prophylaxis: Heparin Code Status: Full Family Communication:  Disposition Plan: Home   Consults called: None    Admission status:  Observation    Time spent: 50 minutes  Dana Allan, MD  [facility] Pager #: [phone] 7PM-7AM contact night coverage as above   08/20/2015, 3:58 PM

## 2015-08-20 NOTE — ED Notes (Signed)
MD aware of patient's potassium value.

## 2015-08-20 NOTE — Progress Notes (Signed)
Pt complaining of severe nausea. Refused all bedtime PO meds. Charidy Cappelletti, Bing Neighbors, RN

## 2015-08-20 NOTE — ED Notes (Signed)
Patient hooked up to cardiac monitor.

## 2015-08-20 NOTE — ED Notes (Signed)
Pt presents  S/P lap chole on June 1st with N/VD.  Abdominal pain throughout weith esophageal pain when vomiting.  Pt reports contents get stuck when vomiting and she must use her finger to get them out.

## 2015-08-21 ENCOUNTER — Observation Stay (HOSPITAL_COMMUNITY): Payer: Medicaid Other

## 2015-08-21 DIAGNOSIS — E876 Hypokalemia: Secondary | ICD-10-CM

## 2015-08-21 DIAGNOSIS — E785 Hyperlipidemia, unspecified: Secondary | ICD-10-CM

## 2015-08-21 DIAGNOSIS — E1140 Type 2 diabetes mellitus with diabetic neuropathy, unspecified: Secondary | ICD-10-CM

## 2015-08-21 DIAGNOSIS — E1165 Type 2 diabetes mellitus with hyperglycemia: Secondary | ICD-10-CM

## 2015-08-21 DIAGNOSIS — Z794 Long term (current) use of insulin: Secondary | ICD-10-CM

## 2015-08-21 DIAGNOSIS — E1169 Type 2 diabetes mellitus with other specified complication: Secondary | ICD-10-CM

## 2015-08-21 DIAGNOSIS — N183 Chronic kidney disease, stage 3 (moderate): Secondary | ICD-10-CM

## 2015-08-21 DIAGNOSIS — R1110 Vomiting, unspecified: Secondary | ICD-10-CM

## 2015-08-21 LAB — CBC
HCT: 30.2 % — ABNORMAL LOW (ref 36.0–46.0)
Hemoglobin: 9.8 g/dL — ABNORMAL LOW (ref 12.0–15.0)
MCH: 29.4 pg (ref 26.0–34.0)
MCHC: 32.5 g/dL (ref 30.0–36.0)
MCV: 90.7 fL (ref 78.0–100.0)
Platelets: 377 10*3/uL (ref 150–400)
RBC: 3.33 MIL/uL — ABNORMAL LOW (ref 3.87–5.11)
RDW: 14.4 % (ref 11.5–15.5)
WBC: 12.4 10*3/uL — ABNORMAL HIGH (ref 4.0–10.5)

## 2015-08-21 LAB — RENAL FUNCTION PANEL
Albumin: 3.3 g/dL — ABNORMAL LOW (ref 3.5–5.0)
Anion gap: 8 (ref 5–15)
BUN: 18 mg/dL (ref 6–20)
CO2: 28 mmol/L (ref 22–32)
Calcium: 8 mg/dL — ABNORMAL LOW (ref 8.9–10.3)
Chloride: 102 mmol/L (ref 101–111)
Creatinine, Ser: 1.11 mg/dL — ABNORMAL HIGH (ref 0.44–1.00)
GFR calc Af Amer: >60 mL/min (ref >60)
GFR calc non Af Amer: 56 mL/min — ABNORMAL LOW (ref >60)
Glucose, Bld: 282 mg/dL — ABNORMAL HIGH (ref 65–99)
Phosphorus: 2.6 mg/dL (ref 2.5–4.6)
Potassium: 3 mmol/L — ABNORMAL LOW (ref 3.5–5.1)
Sodium: 138 mmol/L (ref 135–145)

## 2015-08-21 LAB — GLUCOSE, CAPILLARY
Glucose-Capillary: 285 mg/dL — ABNORMAL HIGH (ref 65–99)
Glucose-Capillary: 328 mg/dL — ABNORMAL HIGH (ref 65–99)
Glucose-Capillary: 258 mg/dL — ABNORMAL HIGH (ref 65–99)
Glucose-Capillary: 289 mg/dL — ABNORMAL HIGH (ref 65–99)

## 2015-08-21 LAB — HEMOGLOBIN A1C
Hgb A1c MFr Bld: 10.3 % — ABNORMAL HIGH (ref 4.8–5.6)
Mean Plasma Glucose: 249 mg/dL

## 2015-08-21 MED ORDER — ONDANSETRON HCL 4 MG/2ML IJ SOLN
4.0000 mg | Freq: Four times a day (QID) | INTRAMUSCULAR | Status: DC
  Administered 2015-08-21 – 2015-08-24 (×9): 4 mg via INTRAVENOUS
  Filled 2015-08-21 (×12): qty 2

## 2015-08-21 MED ORDER — DIPHENHYDRAMINE HCL 25 MG PO CAPS
25.0000 mg | ORAL_CAPSULE | Freq: Four times a day (QID) | ORAL | Status: DC | PRN
  Filled 2015-08-21: qty 1

## 2015-08-21 MED ORDER — INSULIN ASPART 100 UNIT/ML ~~LOC~~ SOLN
0.0000 [IU] | SUBCUTANEOUS | Status: DC
  Administered 2015-08-21 (×2): 8 [IU] via SUBCUTANEOUS
  Administered 2015-08-22: 3 [IU] via SUBCUTANEOUS

## 2015-08-21 MED ORDER — ONDANSETRON HCL 4 MG/2ML IJ SOLN
4.0000 mg | Freq: Three times a day (TID) | INTRAMUSCULAR | Status: DC | PRN
  Administered 2015-08-21 – 2015-08-22 (×3): 4 mg via INTRAVENOUS
  Filled 2015-08-21 (×4): qty 2

## 2015-08-21 MED ORDER — DIPHENHYDRAMINE HCL 50 MG/ML IJ SOLN
12.5000 mg | Freq: Once | INTRAMUSCULAR | Status: AC
  Administered 2015-08-21: 12.5 mg via INTRAVENOUS
  Filled 2015-08-21: qty 1

## 2015-08-21 MED ORDER — HYDROMORPHONE HCL 1 MG/ML IJ SOLN
1.0000 mg | INTRAMUSCULAR | Status: DC | PRN
  Administered 2015-08-21 – 2015-08-24 (×10): 1 mg via INTRAVENOUS
  Filled 2015-08-21 (×10): qty 1

## 2015-08-21 MED ORDER — INSULIN NPH (HUMAN) (ISOPHANE) 100 UNIT/ML ~~LOC~~ SUSP
30.0000 [IU] | Freq: Two times a day (BID) | SUBCUTANEOUS | Status: DC
  Administered 2015-08-21: 30 [IU] via SUBCUTANEOUS
  Filled 2015-08-21: qty 10

## 2015-08-21 MED ORDER — METOCLOPRAMIDE HCL 5 MG/ML IJ SOLN
10.0000 mg | Freq: Four times a day (QID) | INTRAMUSCULAR | Status: DC
  Administered 2015-08-21 – 2015-08-23 (×6): 10 mg via INTRAVENOUS
  Filled 2015-08-21 (×8): qty 2

## 2015-08-21 MED ORDER — DIATRIZOATE MEGLUMINE & SODIUM 66-10 % PO SOLN
30.0000 mL | ORAL | Status: DC | PRN
  Filled 2015-08-21: qty 30

## 2015-08-21 MED ORDER — POTASSIUM CHLORIDE 10 MEQ/100ML IV SOLN
10.0000 meq | INTRAVENOUS | Status: AC
  Administered 2015-08-21 (×4): 10 meq via INTRAVENOUS
  Filled 2015-08-21 (×3): qty 100

## 2015-08-21 MED ORDER — IOPAMIDOL (ISOVUE-300) INJECTION 61%
100.0000 mL | Freq: Once | INTRAVENOUS | Status: AC | PRN
  Administered 2015-08-21: 100 mL via INTRAVENOUS

## 2015-08-21 NOTE — Progress Notes (Addendum)
Inpatient Diabetes Program Recommendations  AACE/ADA: New Consensus Statement on Inpatient Glycemic Control (2015)  Target Ranges:  Prepandial:   less than 140 mg/dL      Peak postprandial:   less than 180 mg/dL (1-2 hours)      Critically ill patients:  140 - 180 mg/dL   Results for Deborah [name], Deborah [name] (MRN [id number]) as of 08/21/2015 12:03  Ref. Range 08/20/2015 12:46 08/20/2015 17:26 08/20/2015 21:06  Glucose-Capillary Latest Ref Range: 65-99 mg/dL 355 (H) 336 (H) 336 (H)   Results for [name], [name] (MRN [id number]) as of 08/21/2015 12:03  Ref. Range 08/21/2015 07:52  Glucose-Capillary Latest Ref Range: 65-99 mg/dL 285 (H)   Results for [name], [name] (MRN [id number]) as of 08/21/2015 12:03  Ref. Range 08/20/2015 12:08  Hemoglobin A1C Latest Ref Range: 4.8-5.6 % 10.3 (H)    Home DM Meds: 70/30 Insulin- 75 units with Breakfast/ 60 units with Lunch/ 50 units with Dinner  Current Insulin Orders: Lantus 10 units QHS      Novolog Sensitive Correction Scale/ SSI (0-9 units) TID AC      MD- Please consider the following in-hospital insulin adjustments while patient is NPO and having Nausea and Vomiting:  1. Increase Lantus to 28 units QHS (0.2 units/kg dosing)  ----Patient takes a large amount of insulin at home in her 70/30 doses and will likely need more basal insulin  2. Increase Novolog Correction Scale/ SSI to Novolog Moderate Correction Scale/ SSI (0-15 units) Q4 hours  3. Patient needs further Diabetes Care follow-up with her PCP: Dr. Ernest Hab[facility] with [facility].  Patient last saw her PCP on 05/14/15.     --Will follow patient during hospitalization--  Wyn Quaker RN, MSN, CDE Diabetes Coordinator Inpatient Glycemic Control Team Team Pager: [phone] (8a-5p)

## 2015-08-21 NOTE — Progress Notes (Addendum)
Pt complaing of 9/10 chest pain and SOB.  VSS.  .  MD made aware.   Will continue to monitor closely and follow any new orders.

## 2015-08-21 NOTE — Progress Notes (Signed)
Addendum 2:30pm-  Spoke with patient about her current A1c of 10.3%.  Explained what an A1c is and what it measures.  Reminded patient that her goal A1c is 7% or less per ADA standards to prevent both acute and long-term complications.  Explained to patient the extreme importance of good glucose control at home.  Encouraged patient to check her CBGs at least tid at home and to record all CBGs in a logbook for her PCP to review.  Patient told me she recently lost her insurance and is looking for another provider who will see patients without insurance.  Will ask Care Management to see patient and provide pt with a list of providers who can see her for her primary care needs.  Has insulin at home and CBG meter at home as well.  Patient stated to me that she knows her A1c needs to be 7% or less but that she has been sick a lot lately and attributes her frequent illness to her poor glucose control.  Patient stated she knows what to do and just wants to feel better.  Also stated to me that her Medicaid approval is pending.   --Will follow patient during hospitalization--  Wyn Quaker RN, MSN, CDE Diabetes Coordinator Inpatient Glycemic Control Team Team Pager: [phone] (8a-5p)

## 2015-08-21 NOTE — Progress Notes (Addendum)
PROGRESS NOTE    Deborah [name]  [id number] DOB: 1965/11/01 DOA: 08/20/2015  PCP: Ernest Haber, MD   Brief Narrative:  50 year old female with a history of diabetes mellitus, hypertension, CVA, recurrent ER visits for abdominal and chest pain, peptic ulcer disease who underwent a cholecystectomy on June 1 presents with nausea vomiting and abdominal pain. She has been admitted multiple times for her GI issues. She states this has been an ongoing issue since December 2016. She had an EGD performed in January which showed she had a peptic ulcer. She has been taking a daily PPI but states she ran out 2 days ago. As mentioned she had a cholecystectomy recently. She states symptoms improved shortly but then recurred to a point where she was unable to tolerate liquids and has had excessive vomiting.  Subjective: As mentioned above, has vomiting with solids and liquids and diffuse abdominal pain radiating up towards her chest. No fevers or chills.  Assessment & Plan:   Principal Problem:   Nausea and vomiting -Her issue has not resolved with treatment of peptic ulcer disease and a cholecystectomy, I suspect she may have diabetic gastroparesis -Attempted to obtain gastric emptying study today however, she was actively vomiting and therefore could not be performed-at this time she states her pain is so severe that she needs narcotics and therefore study will not be able to be performed as long as she is taking narcotics --due to recent cholecystectomy will obtain a stat CT of the abdomen and pelvis  -IV Reglan 4 times a day along with low-dose Dilaudid which will be stopped once pain improves -cont PPI and Carafate (IV protonix critical shortage- on oral)  Active Problems:   Uncontrolled type 2 diabetes mellitus with diabetic neuropathy, with long-term current use of insulin  -Continue insulin to control sugars-we will place on NPH twice a day along with sliding scale insulin -A1c is  10.3    Dyslipidemia associated with type 2 diabetes mellitus  -hold statin while vomiting    Dehydration with CKD (chronic kidney disease) stage 3, GFR 30-59 ml/min - Mild rise in creatinine on admission is improving with hydration    Hypokalemia, hypophosphatemia, Hypomagnesemia - Replacing- cannot tolerated oral phos- IV phos is mixed in dextrose therefore, will not replace today   DVT prophylaxis: Heparin Code Status: Full code Family Communication:  Disposition Plan: Continue to follow in hospital until nausea improves Consultants:   None Procedures:   None Antimicrobials:  Anti-infectives    None       Objective: Filed Vitals:   08/20/15 1617 08/20/15 1659 08/20/15 2058 08/21/15 0445  BP:  129/75 136/68 137/75  Pulse:  121 114 102  Temp:  97.7 F (36.5 C) 98.5 F (36.9 C) 98.3 F (36.8 C)  TempSrc:  Oral Oral Oral  Resp:  20 20 20   Height:  5\' 6"  (1.676 m)    Weight:  138.8 kg (306 lb)    SpO2: 100% 100% 100% 100%    Intake/Output Summary (Last 24 hours) at 08/21/15 1408 Last data filed at 08/21/15 0700  Gross per 24 hour  Intake    290 ml  Output    600 ml  Net   -310 ml   Filed Weights   08/20/15 1159 08/20/15 1659  Weight: 141.522 kg (312 lb) 138.8 kg (306 lb)    Examination: General exam: Appears comfortable  HEENT: PERRLA, oral mucosa moist, no sclera icterus or thrush Respiratory system: Clear to auscultation. Respiratory effort normal. Cardiovascular system:  S1 & S2 heard, RRR.  No murmurs  Gastrointestinal system: Abdomen soft, diffusely tender, nondistended. Normal bowel sound. No organomegaly Central nervous system: Alert and oriented. No focal neurological deficits. Extremities: No cyanosis, clubbing or edema Skin: No rashes or ulcers Psychiatry:  Mood & affect appropriate.     Data Reviewed: I have personally reviewed following labs and imaging studies  CBC:  Recent Labs Lab 08/20/15 1208 08/21/15 0326  WBC 14.5* 12.4*    HGB 11.9* 9.8*  HCT 36.6 30.2*  MCV 89.1 90.7  PLT 463* [id number]   Basic Metabolic Panel:  Recent Labs Lab 08/20/15 1208 08/20/15 1437 08/21/15 0326  NA 137  --  138  K 2.7*  --  3.0*  CL 94*  --  102  CO2 27  --  28  GLUCOSE 332*  --  282*  BUN 18  --  18  CREATININE 1.32*  --  1.11*  CALCIUM 9.2  --  8.0*  MG  --  1.4*  --   PHOS  --  1.8* 2.6   GFR: Estimated Creatinine Clearance: 86.2 mL/min (by C-G formula based on Cr of 1.11). Liver Function Tests:  Recent Labs Lab 08/20/15 1208 08/21/15 0326  AST 22  --   ALT 19  --   ALKPHOS 86  --   BILITOT 0.7  --   PROT 8.9*  --   ALBUMIN 4.3 3.3*    Recent Labs Lab 08/20/15 1208  LIPASE 23   No results for input(s): AMMONIA in the last 168 hours. Coagulation Profile: No results for input(s): INR, PROTIME in the last 168 hours. Cardiac Enzymes:  Recent Labs Lab 08/20/15 1437  TROPONINI <0.03   BNP (last 3 results) No results for input(s): PROBNP in the last 8760 hours. HbA1C:  Recent Labs  08/20/15 1208  HGBA1C 10.3*   CBG:  Recent Labs Lab 08/20/15 1246 08/20/15 1726 08/20/15 2106 08/21/15 0752 08/21/15 1308  GLUCAP 355* 336* 336* 285* 328*   Lipid Profile: No results for input(s): CHOL, HDL, LDLCALC, TRIG, CHOLHDL, LDLDIRECT in the last 72 hours. Thyroid Function Tests: No results for input(s): TSH, T4TOTAL, FREET4, T3FREE, THYROIDAB in the last 72 hours. Anemia Panel: No results for input(s): VITAMINB12, FOLATE, FERRITIN, TIBC, IRON, RETICCTPCT in the last 72 hours. Urine analysis:    Component Value Date/Time   COLORURINE YELLOW 08/20/2015 [place] 08/20/2015 1644   LABSPEC 1.026 08/20/2015 1644   PHURINE 5.5 08/20/2015 1644   GLUCOSEU >1000* 08/20/2015 1644   HGBUR SMALL* 08/20/2015 1644   BILIRUBINUR NEGATIVE 08/20/2015 1644   KETONESUR 40* 08/20/2015 1644   PROTEINUR 100* 08/20/2015 1644   UROBILINOGEN 0.2 10/02/2013 2108   NITRITE NEGATIVE 08/20/2015 1644    LEUKOCYTESUR NEGATIVE 08/20/2015 1644   Sepsis Labs: @LABRCNTIP (procalcitonin:4,lacticidven:4) )No results found for this or any previous visit (from the past 240 hour(s)).       Radiology Studies: Dg Abd Portable 1v  08/21/2015  CLINICAL DATA:  Lower abdominal pain with nausea and vomiting. EXAM: PORTABLE ABDOMEN - 1 VIEW COMPARISON:  CT, 08/08/2015 FINDINGS: Normal bowel gas pattern. Status post cholecystectomy. No evidence of renal or ureteral stones. Soft tissues otherwise unremarkable. IMPRESSION: 1. No acute finding.  No evidence of bowel obstruction. Electronically Signed   By: Lajean Manes M.D.   On: 08/21/2015 08:51      Scheduled Meds: . allopurinol  100 mg Oral Daily  . amLODipine  10 mg Oral Daily  . carvedilol  6.25 mg  Oral BID WC  . docusate sodium  100 mg Oral BID  . DULoxetine  30 mg Oral BID  . heparin subcutaneous  5,000 Units Subcutaneous Q12H  . insulin aspart  0-9 Units Subcutaneous TID WC  . insulin glargine  10 Units Subcutaneous QHS  . metoCLOPramide (REGLAN) injection  10 mg Intravenous Q6H  . ondansetron (ZOFRAN) IV  4 mg Intravenous Q6H  . pantoprazole  40 mg Oral BID  . senna  1 tablet Oral BID  . sucralfate  1 g Oral QID  . Vitamin D (Ergocalciferol)  50,000 Units Oral Q7 days   Continuous Infusions: . 0.9 % NaCl with KCl 20 mEq / L 100 mL/hr at 08/21/15 0407        Time spent in minutes: 8    [place], MD [facility] Pager: [email or web address] Password [facility] 08/21/2015, 2:08 PM

## 2015-08-22 DIAGNOSIS — N183 Chronic kidney disease, stage 3 (moderate): Secondary | ICD-10-CM | POA: Diagnosis not present

## 2015-08-22 DIAGNOSIS — Z8711 Personal history of peptic ulcer disease: Secondary | ICD-10-CM | POA: Diagnosis not present

## 2015-08-22 DIAGNOSIS — E1122 Type 2 diabetes mellitus with diabetic chronic kidney disease: Secondary | ICD-10-CM | POA: Diagnosis not present

## 2015-08-22 DIAGNOSIS — Z880 Allergy status to penicillin: Secondary | ICD-10-CM | POA: Diagnosis not present

## 2015-08-22 DIAGNOSIS — E1165 Type 2 diabetes mellitus with hyperglycemia: Secondary | ICD-10-CM | POA: Diagnosis not present

## 2015-08-22 DIAGNOSIS — Z794 Long term (current) use of insulin: Secondary | ICD-10-CM | POA: Diagnosis not present

## 2015-08-22 DIAGNOSIS — I129 Hypertensive chronic kidney disease with stage 1 through stage 4 chronic kidney disease, or unspecified chronic kidney disease: Secondary | ICD-10-CM | POA: Diagnosis present

## 2015-08-22 DIAGNOSIS — K3184 Gastroparesis: Secondary | ICD-10-CM | POA: Diagnosis not present

## 2015-08-22 DIAGNOSIS — Z79899 Other long term (current) drug therapy: Secondary | ICD-10-CM | POA: Diagnosis not present

## 2015-08-22 DIAGNOSIS — E876 Hypokalemia: Secondary | ICD-10-CM | POA: Diagnosis present

## 2015-08-22 DIAGNOSIS — R112 Nausea with vomiting, unspecified: Secondary | ICD-10-CM | POA: Diagnosis present

## 2015-08-22 DIAGNOSIS — Z888 Allergy status to other drugs, medicaments and biological substances status: Secondary | ICD-10-CM | POA: Diagnosis not present

## 2015-08-22 DIAGNOSIS — N179 Acute kidney failure, unspecified: Secondary | ICD-10-CM | POA: Diagnosis not present

## 2015-08-22 DIAGNOSIS — Z6841 Body Mass Index (BMI) 40.0 and over, adult: Secondary | ICD-10-CM | POA: Diagnosis not present

## 2015-08-22 DIAGNOSIS — Z79891 Long term (current) use of opiate analgesic: Secondary | ICD-10-CM | POA: Diagnosis not present

## 2015-08-22 DIAGNOSIS — R197 Diarrhea, unspecified: Secondary | ICD-10-CM | POA: Diagnosis not present

## 2015-08-22 DIAGNOSIS — E785 Hyperlipidemia, unspecified: Secondary | ICD-10-CM | POA: Diagnosis not present

## 2015-08-22 DIAGNOSIS — Z8673 Personal history of transient ischemic attack (TIA), and cerebral infarction without residual deficits: Secondary | ICD-10-CM | POA: Diagnosis not present

## 2015-08-22 DIAGNOSIS — E1143 Type 2 diabetes mellitus with diabetic autonomic (poly)neuropathy: Secondary | ICD-10-CM | POA: Diagnosis not present

## 2015-08-22 LAB — BASIC METABOLIC PANEL
Anion gap: 6 (ref 5–15)
BUN: 16 mg/dL (ref 6–20)
CALCIUM: 8.1 mg/dL — AB (ref 8.9–10.3)
CHLORIDE: 106 mmol/L (ref 101–111)
CO2: 27 mmol/L (ref 22–32)
CREATININE: 1.08 mg/dL — AB (ref 0.44–1.00)
GFR calc Af Amer: >60 mL/min (ref >60)
GFR, EST NON AFRICAN AMERICAN: 58 mL/min — AB (ref >60)
Glucose, Bld: 207 mg/dL — ABNORMAL HIGH (ref 65–99)
Potassium: 3.5 mmol/L (ref 3.5–5.1)
SODIUM: 139 mmol/L (ref 135–145)

## 2015-08-22 LAB — GLUCOSE, CAPILLARY
Glucose-Capillary: 174 mg/dL — ABNORMAL HIGH (ref 65–99)
Glucose-Capillary: 187 mg/dL — ABNORMAL HIGH (ref 65–99)
Glucose-Capillary: 196 mg/dL — ABNORMAL HIGH (ref 65–99)
Glucose-Capillary: 197 mg/dL — ABNORMAL HIGH (ref 65–99)
Glucose-Capillary: 229 mg/dL — ABNORMAL HIGH (ref 65–99)
Glucose-Capillary: 275 mg/dL — ABNORMAL HIGH (ref 65–99)

## 2015-08-22 LAB — MAGNESIUM: MAGNESIUM: 1.7 mg/dL (ref 1.7–2.4)

## 2015-08-22 MED ORDER — INSULIN NPH (HUMAN) (ISOPHANE) 100 UNIT/ML ~~LOC~~ SUSP
40.0000 [IU] | Freq: Every day | SUBCUTANEOUS | Status: DC
  Administered 2015-08-22 – 2015-08-23 (×2): 40 [IU] via SUBCUTANEOUS
  Filled 2015-08-22: qty 10

## 2015-08-22 MED ORDER — INSULIN NPH (HUMAN) (ISOPHANE) 100 UNIT/ML ~~LOC~~ SUSP
40.0000 [IU] | Freq: Every day | SUBCUTANEOUS | Status: DC
Start: 2015-08-23 — End: 2015-08-22
  Filled 2015-08-22: qty 10

## 2015-08-22 MED ORDER — INSULIN ASPART 100 UNIT/ML ~~LOC~~ SOLN
0.0000 [IU] | Freq: Three times a day (TID) | SUBCUTANEOUS | Status: DC
  Administered 2015-08-22: 3 [IU] via SUBCUTANEOUS
  Administered 2015-08-22: 8 [IU] via SUBCUTANEOUS
  Administered 2015-08-22: 3 [IU] via SUBCUTANEOUS
  Administered 2015-08-23: 8 [IU] via SUBCUTANEOUS
  Administered 2015-08-23: 3 [IU] via SUBCUTANEOUS
  Administered 2015-08-24: 2 [IU] via SUBCUTANEOUS
  Administered 2015-08-24: 5 [IU] via SUBCUTANEOUS
  Administered 2015-08-24: 3 [IU] via SUBCUTANEOUS

## 2015-08-22 NOTE — Progress Notes (Signed)
PROGRESS NOTE    Deborah [name]  [id number] DOB: Mar 11, 1965 DOA: 08/20/2015  PCP: Ernest Haber, MD   Brief Narrative:  50 year old female with a history of diabetes mellitus, hypertension, CVA, recurrent ER visits for abdominal and chest pain, peptic ulcer disease who underwent a cholecystectomy on June 1 presents with nausea vomiting and abdominal pain. She has been admitted multiple times for her GI issues. She states this has been an ongoing issue since December 2016. She had an EGD performed in January which showed she had a peptic ulcer. She has been taking a daily PPI but states she ran out 2 days ago. As mentioned she had a cholecystectomy recently. She states symptoms improved shortly but then recurred to a point where she was unable to tolerate liquids and has had excessive vomiting.  Subjective: Not nauseated this AM. Wanting to try to drink- did ok with sips of liquids. Abdomen is sore but no severe pain like yesterday.   Assessment & Plan:   Principal Problem:   Nausea and vomiting -Her issue has not resolved with treatment of peptic ulcer disease and a cholecystectomy, I suspect she may have diabetic gastroparesis -Attempted to obtain gastric emptying study today however, she was actively vomiting and therefore could not be performed-at this time she states her pain is so severe that she needs narcotics and therefore study will not be able to be performed as long as she is taking narcotics --due to recent cholecystectomy, I obtained a stat CT of the abdomen and pelvis and reviewed it with the surgeons- per surgeyr, it shows changes consistent with recent cholecystectomy  - continue on IV Reglan 4 times a day along with low-dose Dilaudid which will be stopped once pain improves - explained the need for small meals and the avoidance of narcotics in the long run- she appears to understand the reasoning -cont PPI and Carafate (IV protonix critical shortage- on  oral)  Active Problems:   Uncontrolled type 2 diabetes mellitus with diabetic neuropathy, with long-term current use of insulin  -Continue insulin to control sugars-we will place on NPH twice a day along with sliding scale insulin -A1c is 10.3    Dyslipidemia associated with type 2 diabetes mellitus  -hold statin while vomiting    Dehydration with CKD (chronic kidney disease) stage 3, GFR 30-59 ml/min - Mild rise in creatinine on admission is improving with hydration    Hypokalemia, hypophosphatemia, Hypomagnesemia - Replaced     DVT prophylaxis: Heparin Code Status: Full code Family Communication:  Disposition Plan: Continue to follow in hospital until nausea improves Consultants:   None Procedures:   None Antimicrobials:  Anti-infectives    None       Objective: Filed Vitals:   08/21/15 1500 08/21/15 2003 08/22/15 0441 08/22/15 1011  BP: 129/70 112/76 124/74 134/77  Pulse: 110 100 88 105  Temp: 98.2 F (36.8 C) 98.2 F (36.8 C) 97.6 F (36.4 C)   TempSrc: Oral Oral Oral   Resp: 20 20 18    Height:      Weight:      SpO2: 100% 100% 98%     Intake/Output Summary (Last 24 hours) at 08/22/15 1115 Last data filed at 08/22/15 0630  Gross per 24 hour  Intake 2363.33 ml  Output      0 ml  Net 2363.33 ml   Filed Weights   08/20/15 1159 08/20/15 1659  Weight: 141.522 kg (312 lb) 138.8 kg (306 lb)    Examination: General exam: Appears comfortable  HEENT: PERRLA, oral mucosa moist, no sclera icterus or thrush Respiratory system: Clear to auscultation. Respiratory effort normal. Cardiovascular system: S1 & S2 heard, RRR.  No murmurs  Gastrointestinal system: Abdomen soft, diffusely tender, nondistended. Normal bowel sound. No organomegaly Central nervous system: Alert and oriented. No focal neurological deficits. Extremities: No cyanosis, clubbing or edema Skin: No rashes or ulcers Psychiatry:  Mood & affect appropriate.     Data Reviewed: I have  personally reviewed following labs and imaging studies  CBC:  Recent Labs Lab 08/20/15 1208 08/21/15 0326  WBC 14.5* 12.4*  HGB 11.9* 9.8*  HCT 36.6 30.2*  MCV 89.1 90.7  PLT 463* Q000111Q   Basic Metabolic Panel:  Recent Labs Lab 08/20/15 1208 08/20/15 1437 08/21/15 0326 08/22/15 0418  NA 137  --  138 139  K 2.7*  --  3.0* 3.5  CL 94*  --  102 106  CO2 27  --  28 27  GLUCOSE 332*  --  282* 207*  BUN 18  --  18 16  CREATININE 1.32*  --  1.11* 1.08*  CALCIUM 9.2  --  8.0* 8.1*  MG  --  1.4*  --  1.7  PHOS  --  1.8* 2.6  --    GFR: Estimated Creatinine Clearance: 88.6 mL/min (by C-G formula based on Cr of 1.08). Liver Function Tests:  Recent Labs Lab 08/20/15 1208 08/21/15 0326  AST 22  --   ALT 19  --   ALKPHOS 86  --   BILITOT 0.7  --   PROT 8.9*  --   ALBUMIN 4.3 3.3*    Recent Labs Lab 08/20/15 1208  LIPASE 23   No results for input(s): AMMONIA in the last 168 hours. Coagulation Profile: No results for input(s): INR, PROTIME in the last 168 hours. Cardiac Enzymes:  Recent Labs Lab 08/20/15 1437  TROPONINI <0.03   BNP (last 3 results) No results for input(s): PROBNP in the last 8760 hours. HbA1C:  Recent Labs  08/20/15 1208  HGBA1C 10.3*   CBG:  Recent Labs Lab 08/21/15 1701 08/21/15 2000 08/22/15 0022 08/22/15 0443 08/22/15 0806  GLUCAP 258* 289* 229* 197* 196*   Lipid Profile: No results for input(s): CHOL, HDL, LDLCALC, TRIG, CHOLHDL, LDLDIRECT in the last 72 hours. Thyroid Function Tests: No results for input(s): TSH, T4TOTAL, FREET4, T3FREE, THYROIDAB in the last 72 hours. Anemia Panel: No results for input(s): VITAMINB12, FOLATE, FERRITIN, TIBC, IRON, RETICCTPCT in the last 72 hours. Urine analysis:    Component Value Date/Time   COLORURINE YELLOW 08/20/2015 [place] 08/20/2015 1644   LABSPEC 1.026 08/20/2015 1644   PHURINE 5.5 08/20/2015 1644   GLUCOSEU >1000* 08/20/2015 1644   HGBUR SMALL*  08/20/2015 1644   BILIRUBINUR NEGATIVE 08/20/2015 1644   KETONESUR 40* 08/20/2015 1644   PROTEINUR 100* 08/20/2015 1644   UROBILINOGEN 0.2 10/02/2013 2108   NITRITE NEGATIVE 08/20/2015 1644   LEUKOCYTESUR NEGATIVE 08/20/2015 1644   Sepsis Labs: @LABRCNTIP (procalcitonin:4,lacticidven:4) )No results found for this or any previous visit (from the past 240 hour(s)).       Radiology Studies: Ct Abdomen Pelvis W Contrast  08/21/2015  CLINICAL DATA:  Abdominal pain, history of recent cholecystectomy EXAM: CT ABDOMEN AND PELVIS WITH CONTRAST TECHNIQUE: Multidetector CT imaging of the abdomen and pelvis was performed using the standard protocol following bolus administration of intravenous contrast. CONTRAST:  181mL ISOVUE-300 IOPAMIDOL (ISOVUE-300) INJECTION 61% COMPARISON:  08/08/2015 FINDINGS: Lung bases are free of acute infiltrate or sizable  effusion. The gallbladder has been surgically removed. Fatty infiltration of the liver is seen. The spleen, adrenal glands and pancreas are within normal limits. The kidneys are well visualized bilaterally within normal enhancement and excretion pattern. No renal calculi or obstructive changes are seen. Some inflammatory changes are noted near the umbilicus likely related to the prior surgery although they are new from the recent exam. No focal fluid collection is identified. The appendix is not well visualized although no inflammatory changes are seen to suggest appendicitis. The bladder is partially distended. The uterus is well visualized. Likely uterine fibroid is again noted on the left. It is stable in appearance. No free pelvic fluid is noted. No bony abnormality is seen. IMPRESSION: New inflammatory changes adjacent to the umbilicus which may be related to localized infection of previous laparoscopic port. Clinical correlation with port location is recommended. The remainder of the study is stable from the prior exam. No other acute abnormality is seen.  Electronically Signed   By: Inez Catalina M.D.   On: 08/21/2015 17:12   Dg Abd Portable 1v  08/21/2015  CLINICAL DATA:  Lower abdominal pain with nausea and vomiting. EXAM: PORTABLE ABDOMEN - 1 VIEW COMPARISON:  CT, 08/08/2015 FINDINGS: Normal bowel gas pattern. Status post cholecystectomy. No evidence of renal or ureteral stones. Soft tissues otherwise unremarkable. IMPRESSION: 1. No acute finding.  No evidence of bowel obstruction. Electronically Signed   By: Lajean Manes M.D.   On: 08/21/2015 08:51      Scheduled Meds: . allopurinol  100 mg Oral Daily  . amLODipine  10 mg Oral Daily  . carvedilol  6.25 mg Oral BID WC  . docusate sodium  100 mg Oral BID  . DULoxetine  30 mg Oral BID  . heparin subcutaneous  5,000 Units Subcutaneous Q12H  . insulin aspart  0-15 Units Subcutaneous TID WC  . insulin NPH Human  40 Units Subcutaneous QAC breakfast  . metoCLOPramide (REGLAN) injection  10 mg Intravenous Q6H  . ondansetron (ZOFRAN) IV  4 mg Intravenous Q6H  . pantoprazole  40 mg Oral BID  . senna  1 tablet Oral BID  . sucralfate  1 g Oral QID  . Vitamin D (Ergocalciferol)  50,000 Units Oral Q7 days   Continuous Infusions: . 0.9 % NaCl with KCl 20 mEq / L 100 mL/hr at 08/21/15 2344        Time spent in minutes: 91    [place], MD [facility] Pager: [email or web address] Password [email or web address] 08/22/2015, 11:15 AM

## 2015-08-22 NOTE — Progress Notes (Signed)
Inpatient Diabetes Program Recommendations  AACE/ADA: New Consensus Statement on Inpatient Glycemic Control (2015)  Target Ranges:  Prepandial:   less than 140 mg/dL      Peak postprandial:   less than 180 mg/dL (1-2 hours)      Critically ill patients:  140 - 180 mg/dL   Results for HEBA, ARUTYUNYAN (MRN [id number]) as of 08/22/2015 13:23  Ref. Range 08/22/2015 00:22 08/22/2015 04:43 08/22/2015 08:06 08/22/2015 12:54  Glucose-Capillary Latest Ref Range: 65-99 mg/dL 229 (H) 197 (H) 196 (H) 275 (H)     Home DM Meds: 70/30 Insulin- 75 units with Breakfast/ 60 units with Lunch/ 50 units with Dinner  Current Insulin Orders: Lantus 10 units QHS  Novolog Sensitive Correction Scale/ SSI (0-9 units) TID AC     MD- Please consider adding bedtime dose of NPH to current In-hospital insulin regimen:  NPH Insulin 40 units QHS    --Will follow patient during hospitalization--  Wyn Quaker RN, MSN, CDE Diabetes Coordinator Inpatient Glycemic Control Team Team Pager: [phone] (8a-5p)

## 2015-08-22 NOTE — Progress Notes (Signed)
Pt requesting a clear liquid diet, paged MD for thing this am. MD stated she would be comfortable allowing her to have water only. Will pass on to next shift.

## 2015-08-23 ENCOUNTER — Other Ambulatory Visit: Payer: Self-pay

## 2015-08-23 LAB — BASIC METABOLIC PANEL
ANION GAP: 6 (ref 5–15)
BUN: 11 mg/dL (ref 6–20)
CHLORIDE: 107 mmol/L (ref 101–111)
CO2: 26 mmol/L (ref 22–32)
CREATININE: 0.88 mg/dL (ref 0.44–1.00)
Calcium: 8 mg/dL — ABNORMAL LOW (ref 8.9–10.3)
GFR calc non Af Amer: >60 mL/min (ref >60)
Glucose, Bld: 168 mg/dL — ABNORMAL HIGH (ref 65–99)
POTASSIUM: 3.3 mmol/L — AB (ref 3.5–5.1)
SODIUM: 139 mmol/L (ref 135–145)

## 2015-08-23 LAB — GLUCOSE, CAPILLARY
Glucose-Capillary: 196 mg/dL — ABNORMAL HIGH (ref 65–99)
Glucose-Capillary: 263 mg/dL — ABNORMAL HIGH (ref 65–99)
Glucose-Capillary: 146 mg/dL — ABNORMAL HIGH (ref 65–99)
Glucose-Capillary: 226 mg/dL — ABNORMAL HIGH (ref 65–99)

## 2015-08-23 MED ORDER — POTASSIUM CHLORIDE 20 MEQ/15ML (10%) PO SOLN: 40.0000 meq | Freq: Once | ORAL | Status: DC

## 2015-08-23 MED ORDER — POTASSIUM CHLORIDE CRYS ER 20 MEQ PO TBCR
40.0000 meq | EXTENDED_RELEASE_TABLET | ORAL | Status: AC
  Administered 2015-08-23 (×2): 40 meq via ORAL
  Filled 2015-08-23 (×3): qty 2

## 2015-08-23 MED ORDER — METOCLOPRAMIDE HCL 10 MG PO TABS
10.0000 mg | ORAL_TABLET | Freq: Three times a day (TID) | ORAL | Status: DC
  Administered 2015-08-23 – 2015-08-24 (×6): 10 mg via ORAL
  Filled 2015-08-23 (×6): qty 1

## 2015-08-23 MED ORDER — FUROSEMIDE 10 MG/ML IJ SOLN
20.0000 mg | Freq: Once | INTRAMUSCULAR | Status: AC
  Administered 2015-08-23: 20 mg via INTRAVENOUS
  Filled 2015-08-23: qty 2

## 2015-08-23 MED ORDER — INSULIN NPH (HUMAN) (ISOPHANE) 100 UNIT/ML ~~LOC~~ SUSP
40.0000 [IU] | Freq: Two times a day (BID) | SUBCUTANEOUS | Status: DC
  Administered 2015-08-23 – 2015-08-24 (×2): 40 [IU] via SUBCUTANEOUS

## 2015-08-23 NOTE — Progress Notes (Signed)
Pt c/o sudden onset of midsternal chest pain, denies radiation, skin warm and dry, reports associated shortness of breath. VSS. EKG obtained. Pt has BLE edema +2 that is concerning her. Notified Dr. Wynelle [place].

## 2015-08-23 NOTE — Progress Notes (Signed)
PROGRESS NOTE    Deborah [name]  [id number] DOB: 02-Jan-1966 DOA: 08/20/2015  PCP: Ernest Haber, MD   Brief Narrative:  50 year old female with a history of diabetes mellitus, hypertension, CVA, recurrent ER visits for abdominal and chest pain, peptic ulcer disease who underwent a cholecystectomy on June 1 presents with nausea vomiting and abdominal pain. She has been admitted multiple times for her GI issues. She states this has been an ongoing issue since December 2016. She had an EGD performed in January which showed she had a peptic ulcer. She has been taking a daily PPI but states she ran out 2 days ago. As mentioned she had a cholecystectomy recently. She states symptoms improved shortly but then recurred to a point where she was unable to tolerate liquids and has had excessive vomiting.  Subjective: No abdominal pain of vomiting yesterday on solid food. No new complaints.   Assessment & Plan:   Principal Problem:   Nausea and vomiting -Her issue has not resolved with treatment of peptic ulcer disease and a cholecystectomy, I suspect she may have diabetic gastroparesis -Attempted to obtain gastric emptying study today however, she was actively vomiting and therefore could not be performed-at this time she states her pain is so severe that she needs narcotics and therefore study will not be able to be performed as long as she is taking narcotics --due to recent cholecystectomy, I obtained a stat CT of the abdomen and pelvis and reviewed it with the surgeons- per surgeyr, it shows changes consistent with recent cholecystectomy  - started on IV Reglan 4 times a day along with low-dose Dilaudid which will be stopped once pain improves - explained the need for small meals and the avoidance of narcotics in the long run- she appears to understand the reasoning - much better today- transition Reglan to TID AC and HS and follow over night-  -cont PPI and Carafate (IV protonix critical  shortage- on oral)  Active Problems:   Uncontrolled type 2 diabetes mellitus with diabetic neuropathy, with long-term current use of insulin  -Continue insulin to control sugars-  NPH twice a day along with sliding scale insulin -A1c is 10.3    Dyslipidemia associated with type 2 diabetes mellitus  -hold statin while vomiting    Dehydration with CKD (chronic kidney disease) stage 3, GFR 30-59 ml/min - Mild rise in creatinine on admission is improving with hydration    Hypokalemia, hypophosphatemia, Hypomagnesemia - Replaced     DVT prophylaxis: Heparin Code Status: Full code Family Communication:  Disposition Plan: Continue to follow in hospital until nausea improves Consultants:   None Procedures:   None Antimicrobials:  Anti-infectives    None       Objective: Filed Vitals:   08/22/15 1300 08/22/15 2055 08/23/15 0454 08/23/15 0901  BP: 129/70 135/88 126/75 144/94  Pulse: 99 89 91 95  Temp: 97.9 F (36.6 C) 98.3 F (36.8 C) 98.4 F (36.9 C) 97.8 F (36.6 C)  TempSrc: Oral Oral Oral Oral  Resp: 18 18 20 14   Height:      Weight:      SpO2: 100% 100% 100% 100%    Intake/Output Summary (Last 24 hours) at 08/23/15 1038 Last data filed at 08/23/15 0700  Gross per 24 hour  Intake   2550 ml  Output      0 ml  Net   2550 ml   Filed Weights   08/20/15 1159 08/20/15 1659  Weight: 141.522 kg (312 lb) 138.8 kg (306  lb)    Examination: General exam: Appears comfortable  HEENT: PERRLA, oral mucosa moist, no sclera icterus or thrush Respiratory system: Clear to auscultation. Respiratory effort normal. Cardiovascular system: S1 & S2 heard, RRR.  No murmurs  Gastrointestinal system: Abdomen soft, diffusely tender, nondistended. Normal bowel sound. No organomegaly Central nervous system: Alert and oriented. No focal neurological deficits. Extremities: No cyanosis, clubbing or edema Skin: No rashes or ulcers Psychiatry:  Mood & affect appropriate.     Data  Reviewed: I have personally reviewed following labs and imaging studies  CBC:  Recent Labs Lab 08/20/15 1208 08/21/15 0326  WBC 14.5* 12.4*  HGB 11.9* 9.8*  HCT 36.6 30.2*  MCV 89.1 90.7  PLT 463* [id number]   Basic Metabolic Panel:  Recent Labs Lab 08/20/15 1208 08/20/15 1437 08/21/15 0326 08/22/15 0418 08/23/15 0442  NA 137  --  138 139 139  K 2.7*  --  3.0* 3.5 3.3*  CL 94*  --  102 106 107  CO2 27  --  28 27 26   GLUCOSE 332*  --  282* 207* 168*  BUN 18  --  18 16 11   CREATININE 1.32*  --  1.11* 1.08* 0.88  CALCIUM 9.2  --  8.0* 8.1* 8.0*  MG  --  1.4*  --  1.7  --   PHOS  --  1.8* 2.6  --   --    GFR: Estimated Creatinine Clearance: 108.8 mL/min (by C-G formula based on Cr of 0.88). Liver Function Tests:  Recent Labs Lab 08/20/15 1208 08/21/15 0326  AST 22  --   ALT 19  --   ALKPHOS 86  --   BILITOT 0.7  --   PROT 8.9*  --   ALBUMIN 4.3 3.3*    Recent Labs Lab 08/20/15 1208  LIPASE 23   No results for input(s): AMMONIA in the last 168 hours. Coagulation Profile: No results for input(s): INR, PROTIME in the last 168 hours. Cardiac Enzymes:  Recent Labs Lab 08/20/15 1437  TROPONINI <0.03   BNP (last 3 results) No results for input(s): PROBNP in the last 8760 hours. HbA1C:  Recent Labs  08/20/15 1208  HGBA1C 10.3*   CBG:  Recent Labs Lab 08/22/15 0806 08/22/15 1254 08/22/15 1752 08/22/15 2053 08/23/15 0731  GLUCAP 196* 275* 174* 187* 196*   Lipid Profile: No results for input(s): CHOL, HDL, LDLCALC, TRIG, CHOLHDL, LDLDIRECT in the last 72 hours. Thyroid Function Tests: No results for input(s): TSH, T4TOTAL, FREET4, T3FREE, THYROIDAB in the last 72 hours. Anemia Panel: No results for input(s): VITAMINB12, FOLATE, FERRITIN, TIBC, IRON, RETICCTPCT in the last 72 hours. Urine analysis:    Component Value Date/Time   COLORURINE YELLOW 08/20/2015 [place] 08/20/2015 1644   LABSPEC 1.026 08/20/2015 1644   PHURINE 5.5  08/20/2015 1644   GLUCOSEU >1000* 08/20/2015 1644   HGBUR SMALL* 08/20/2015 1644   BILIRUBINUR NEGATIVE 08/20/2015 1644   KETONESUR 40* 08/20/2015 1644   PROTEINUR 100* 08/20/2015 1644   UROBILINOGEN 0.2 10/02/2013 2108   NITRITE NEGATIVE 08/20/2015 1644   LEUKOCYTESUR NEGATIVE 08/20/2015 1644   Sepsis Labs: @LABRCNTIP (procalcitonin:4,lacticidven:4) )No results found for this or any previous visit (from the past 240 hour(s)).       Radiology Studies: Ct Abdomen Pelvis W Contrast  08/21/2015  CLINICAL DATA:  Abdominal pain, history of recent cholecystectomy EXAM: CT ABDOMEN AND PELVIS WITH CONTRAST TECHNIQUE: Multidetector CT imaging of the abdomen and pelvis was performed using the standard protocol following  bolus administration of intravenous contrast. CONTRAST:  126mL ISOVUE-300 IOPAMIDOL (ISOVUE-300) INJECTION 61% COMPARISON:  08/08/2015 FINDINGS: Lung bases are free of acute infiltrate or sizable effusion. The gallbladder has been surgically removed. Fatty infiltration of the liver is seen. The spleen, adrenal glands and pancreas are within normal limits. The kidneys are well visualized bilaterally within normal enhancement and excretion pattern. No renal calculi or obstructive changes are seen. Some inflammatory changes are noted near the umbilicus likely related to the prior surgery although they are new from the recent exam. No focal fluid collection is identified. The appendix is not well visualized although no inflammatory changes are seen to suggest appendicitis. The bladder is partially distended. The uterus is well visualized. Likely uterine fibroid is again noted on the left. It is stable in appearance. No free pelvic fluid is noted. No bony abnormality is seen. IMPRESSION: New inflammatory changes adjacent to the umbilicus which may be related to localized infection of previous laparoscopic port. Clinical correlation with port location is recommended. The remainder of the study is  stable from the prior exam. No other acute abnormality is seen. Electronically Signed   By: Inez Catalina M.D.   On: 08/21/2015 17:12      Scheduled Meds: . allopurinol  100 mg Oral Daily  . amLODipine  10 mg Oral Daily  . carvedilol  6.25 mg Oral BID WC  . docusate sodium  100 mg Oral BID  . DULoxetine  30 mg Oral BID  . heparin subcutaneous  5,000 Units Subcutaneous Q12H  . insulin aspart  0-15 Units Subcutaneous TID WC  . insulin NPH Human  40 Units Subcutaneous BID AC & HS  . metoCLOPramide  10 mg Oral TID AC & HS  . ondansetron (ZOFRAN) IV  4 mg Intravenous Q6H  . pantoprazole  40 mg Oral BID  . potassium chloride  40 mEq Oral Q4H  . senna  1 tablet Oral BID  . sucralfate  1 g Oral QID  . Vitamin D (Ergocalciferol)  50,000 Units Oral Q7 days   Continuous Infusions: . 0.9 % NaCl with KCl 20 mEq / L 100 mL/hr at 08/23/15 1019     LOS: 1 day    Time spent in minutes: 8    [place], MD [facility] Pager: [email or web address] Password [facility] 08/23/2015, 10:38 AM

## 2015-08-24 LAB — CBC
HEMATOCRIT: 28.4 % — AB (ref 36.0–46.0)
HEMOGLOBIN: 9.2 g/dL — AB (ref 12.0–15.0)
MCH: 29.6 pg (ref 26.0–34.0)
MCHC: 32.4 g/dL (ref 30.0–36.0)
MCV: 91.3 fL (ref 78.0–100.0)
Platelets: 358 10*3/uL (ref 150–400)
RBC: 3.11 MIL/uL — ABNORMAL LOW (ref 3.87–5.11)
RDW: 14.3 % (ref 11.5–15.5)
WBC: 9.4 10*3/uL (ref 4.0–10.5)

## 2015-08-24 LAB — BASIC METABOLIC PANEL
ANION GAP: 6 (ref 5–15)
BUN: 8 mg/dL (ref 6–20)
CALCIUM: 8.6 mg/dL — AB (ref 8.9–10.3)
CHLORIDE: 104 mmol/L (ref 101–111)
CO2: 28 mmol/L (ref 22–32)
Creatinine, Ser: 1.03 mg/dL — ABNORMAL HIGH (ref 0.44–1.00)
GFR calc Af Amer: >60 mL/min (ref >60)
GFR calc non Af Amer: >60 mL/min (ref >60)
GLUCOSE: 106 mg/dL — AB (ref 65–99)
Potassium: 3.1 mmol/L — ABNORMAL LOW (ref 3.5–5.1)
Sodium: 138 mmol/L (ref 135–145)

## 2015-08-24 LAB — GLUCOSE, CAPILLARY
Glucose-Capillary: 150 mg/dL — ABNORMAL HIGH (ref 65–99)
Glucose-Capillary: 224 mg/dL — ABNORMAL HIGH (ref 65–99)
Glucose-Capillary: 188 mg/dL — ABNORMAL HIGH (ref 65–99)

## 2015-08-24 LAB — MAGNESIUM: Magnesium: 1.2 mg/dL — ABNORMAL LOW (ref 1.7–2.4)

## 2015-08-24 MED ORDER — MAGNESIUM SULFATE 50 % IJ SOLN
3.0000 g | Freq: Once | INTRAVENOUS | Status: AC
  Administered 2015-08-24: 3 g via INTRAVENOUS
  Filled 2015-08-24: qty 6

## 2015-08-24 MED ORDER — POTASSIUM CHLORIDE CRYS ER 20 MEQ PO TBCR
40.0000 meq | EXTENDED_RELEASE_TABLET | ORAL | Status: AC
  Administered 2015-08-24: 40 meq via ORAL
  Filled 2015-08-24 (×2): qty 2

## 2015-08-24 MED ORDER — METOCLOPRAMIDE HCL 10 MG PO TABS: 10.0000 mg | ORAL_TABLET | Freq: Three times a day (TID) | ORAL | Status: DC

## 2015-08-24 NOTE — Discharge Summary (Addendum)
Physician Discharge Summary  Deborah [name] [id number] DOB: February 03, 1966 DOA: 08/20/2015  PCP: Ernest Haber, MD  Admit date: 08/20/2015 Discharge date: 08/24/2015  Time spent: 50 minutes  Recommendations for Outpatient Follow-up:  1. F/u A1c 2. Encourage weight loss   Discharge Condition: stable    Discharge Diagnoses:  Principal Problem:   Nausea and vomiting- likely diabetic gastroparesis Active Problems:   Uncontrolled type 2 diabetes mellitus with diabetic neuropathy, with long-term current use of insulin (HCC)   Dyslipidemia associated with type 2 diabetes mellitus (HCC)   CKD (chronic kidney disease) stage 3, GFR 30-59 ml/min   Hypokalemia   Hypomagnesemia   History of present illness:  50 year old female with a history of diabetes mellitus, hypertension, CVA, recurrent ER visits for abdominal and chest pain, peptic ulcer disease who underwent a cholecystectomy on June 1 presents with nausea vomiting and abdominal pain. She has been admitted multiple times for her GI issues. She states this has been an ongoing issue since December 2016. She had an EGD performed in January which showed she had a peptic ulcer. She has been taking a daily PPI but states she ran out 2 days ago. As mentioned she had a cholecystectomy recently. She states symptoms improved shortly but then recurred to a point where she was unable to tolerate liquids and has had excessive vomiting.  Hospital Course:  Principal Problem:  Nausea and vomiting - for a minimum of 6 months now -Her issue has not resolved with PPI treatment of peptic ulcer disease and a recent cholecystectomy-  I suspect she may have diabetic gastroparesis -Attempted to obtain gastric emptying study however, she was actively vomiting and therefore could not be performed --due to recent cholecystectomy, I obtained a stat CT of the abdomen and pelvis and reviewed it with the surgeons- per surgery, it shows changes consistent with  recent cholecystectomy  - started on trial of IV Reglan 4 times a day along with low-dose Dilaudid which which was stopped once pain improved - explained the need for small meals and the avoidance of narcotics in the long run- she appears to understand the reasoning - symptoms improved on IV Reglan - transitioned Reglan to oral TID AC and HS and followed over night- doing much better without any vomting and without recurrence of pain- having BMs -  will need to continue Reglan indefinitely  -cont PPI and Carafate as well  Active Problems:  Uncontrolled type 2 diabetes mellitus with diabetic neuropathy, with long-term current use of insulin  --A1c is 10.3- cont 70/30 TID as prescribed by her endocrinologist - I had an extensive discussion about what she is eating (cereals including frosted flakes, frosted mini wheats and juice) - I explained glycemic index in detail- discussed high protein diet as well - she looked up appropriate low GI foods on the Internet and has a better understanding now of the diet she needs to comply with  Morbid obesity Body mass index is 49.41 kg/(m^2). - discussed the need to lose at least 100 pounds but dietary changes and exercise   Dyslipidemia associated with type 2 diabetes mellitus  -held statin while vomiting   Dehydration with CKD (chronic kidney disease) stage 3, GFR 30-59 ml/min - Mild rise in creatinine on admission is improved with hydration   Hypokalemia, hypophosphatemia, Hypomagnesemia - Replaced   Procedures:  none  Consultations:  none  Discharge Exam: Filed Weights   08/20/15 1159 08/20/15 1659  Weight: 141.522 kg (312 lb) 138.8 kg (306 lb)  Filed Vitals:   08/24/15 1003 08/24/15 1420  BP: 118/73 121/78  Pulse: 88 88  Temp: 97.8 F (36.6 C) 97.3 F (36.3 C)  Resp: 20 20    General: AAO x 3, no distress Cardiovascular: RRR, no murmurs  Respiratory: clear to auscultation bilaterally GI: soft, non-tender,  non-distended, bowel sound positive  Discharge Instructions You were cared for by a hospitalist during your hospital stay. If you have any questions about your discharge medications or the care you received while you were in the hospital after you are discharged, you can call the unit and asked to speak with the hospitalist on call if the hospitalist that took care of you is not available. Once you are discharged, your primary care physician will handle any further medical issues. Please note that NO REFILLS for any discharge medications will be authorized once you are discharged, as it is imperative that you return to your primary care physician (or establish a relationship with a primary care physician if you do not have one) for your aftercare needs so that they can reassess your need for medications and monitor your lab values.  Discharge Instructions    Discharge instructions    Complete by:  As directed   Diabetic diet, low sodium low fat     Increase activity slowly    Complete by:  As directed             Medication List    STOP taking these medications        COLACE 100 MG capsule  Generic drug:  docusate sodium      TAKE these medications        albuterol 108 (90 Base) MCG/ACT inhaler  Commonly known as:  PROVENTIL HFA;VENTOLIN HFA  Inhale 2 puffs into the lungs every 6 (six) hours as needed for wheezing or shortness of breath.     allopurinol 100 MG tablet  Commonly known as:  ZYLOPRIM  Take 100 mg by mouth daily.     amLODipine 10 MG tablet  Commonly known as:  NORVASC  Take 10 mg by mouth daily.     CYMBALTA 30 MG capsule  Generic drug:  DULoxetine  Take 30 mg by mouth 2 (two) times daily.     hydrochlorothiazide 25 MG tablet  Commonly known as:  HYDRODIURIL  Take 1 tablet (25 mg total) by mouth daily.     metoCLOPramide 10 MG tablet  Commonly known as:  REGLAN  Take 1 tablet (10 mg total) by mouth 4 (four) times daily -  before meals and at bedtime. Take  30 min prior to eating     metoprolol succinate 25 MG 24 hr tablet  Commonly known as:  TOPROL-XL  Take 25 mg by mouth daily.     NOVOLOG MIX 70/30 (70-30) 100 UNIT/ML injection  Generic drug:  insulin aspart protamine- aspart  Inject 50-75 Units as directed 3 (three) times daily after meals. 75 units every morning, 60 units with lunch, and 50 units with dinner     pantoprazole 40 MG tablet  Commonly known as:  PROTONIX  Take 40 mg by mouth 2 (two) times daily.     pravastatin 10 MG tablet  Commonly known as:  PRAVACHOL  Take 10 mg by mouth daily.     ranitidine 150 MG tablet  Commonly known as:  ZANTAC  Take 150 mg by mouth daily as needed for heartburn.     ROXICODONE 5 MG immediate release tablet  Generic  drug:  oxyCODONE  Take 5 mg by mouth every 8 (eight) hours as needed for moderate pain.     senna 8.6 MG tablet  Commonly known as:  SENOKOT  Take 1 tablet by mouth 2 (two) times daily.     sucralfate 1 g tablet  Commonly known as:  CARAFATE  Take 1 g by mouth 4 (four) times daily.     Vitamin D (Ergocalciferol) 50000 units Caps capsule  Commonly known as:  DRISDOL  Take 50,000 Units by mouth every 7 (seven) days.       Allergies  Allergen Reactions  . Penicillins Palpitations    Has patient had a PCN reaction causing immediate rash, facial/tongue/throat swelling, SOB or lightheadedness with hypotension: Yes Has patient had a PCN reaction causing severe rash involving mucus membranes or skin necrosis: No Has patient had a PCN reaction that required hospitalization: Yes  Has patient had a PCN reaction occurring within the last 10 years: No   . Valium [Diazepam] [name]  . Lisinopril Swelling    Tongue and mouth swelling  . Food Hives and Swelling    Carrots, ketchup   . Morphine And Related        Follow-up Information    Follow up with [facility] On 08/27/2015.   Why:  appointment at 11:00 am   Contact information:   [place] [phone]        The results of significant diagnostics from this hospitalization (including imaging, microbiology, ancillary and laboratory) are listed below for reference.    Significant Diagnostic Studies: Ct Abdomen Pelvis Wo Contrast  08/08/2015  CLINICAL DATA:  Upper abdominal pain after cholecystectomy performed 08/07/2015, elevated white blood count EXAM: CT ABDOMEN AND PELVIS WITHOUT CONTRAST TECHNIQUE: Multidetector CT imaging of the abdomen and pelvis was performed following the standard protocol without IV contrast. COMPARISON:  05/26/2015 FINDINGS: Lower chest:  Mild bilateral lower lobe atelectasis Hepatobiliary: Status post cholecystectomy. Minimal postoperative change in the gallbladder fossa. No fluid collection in the region. Liver appears normal in its non contrasted state. Pancreas: Negative Spleen: Negative Adrenals/Urinary Tract: Negative Stomach/Bowel: Small bowel stomach and large bowel are normal. Vascular/Lymphatic: No significant abnormalities Reproductive: 5 cm I so attenuating mass arising off the left uterine fundus likely a fibroid. It is unchanged. Other: No free fluid in the abdomen or pelvis. Anticipated tract of air in the periumbilical region across the abdominal wall from recent laparoscopy with air down to the abdominal wall musculature. These constitute anticipated postoperative findings. Musculoskeletal: No acute findings IMPRESSION: Anticipated postoperative appearance with no acute abnormalities Electronically Signed   By: Skipper Cliche M.D.   On: 08/08/2015 07:15   Ct Abdomen Pelvis W Contrast  08/21/2015  CLINICAL DATA:  Abdominal pain, history of recent cholecystectomy EXAM: CT ABDOMEN AND PELVIS WITH CONTRAST TECHNIQUE: Multidetector CT imaging of the abdomen and pelvis was performed using the standard protocol following bolus administration of intravenous contrast. CONTRAST:  125mL ISOVUE-300 IOPAMIDOL (ISOVUE-300)  INJECTION 61% COMPARISON:  08/08/2015 FINDINGS: Lung bases are free of acute infiltrate or sizable effusion. The gallbladder has been surgically removed. Fatty infiltration of the liver is seen. The spleen, adrenal glands and pancreas are within normal limits. The kidneys are well visualized bilaterally within normal enhancement and excretion pattern. No renal calculi or obstructive changes are seen. Some inflammatory changes are noted near the umbilicus likely related to the prior surgery although they are new from the recent  exam. No focal fluid collection is identified. The appendix is not well visualized although no inflammatory changes are seen to suggest appendicitis. The bladder is partially distended. The uterus is well visualized. Likely uterine fibroid is again noted on the left. It is stable in appearance. No free pelvic fluid is noted. No bony abnormality is seen. IMPRESSION: New inflammatory changes adjacent to the umbilicus which may be related to localized infection of previous laparoscopic port. Clinical correlation with port location is recommended. The remainder of the study is stable from the prior exam. No other acute abnormality is seen. Electronically Signed   By: Inez Catalina M.D.   On: 08/21/2015 17:12   Dg Abd Portable 1v  08/21/2015  CLINICAL DATA:  Lower abdominal pain with nausea and vomiting. EXAM: PORTABLE ABDOMEN - 1 VIEW COMPARISON:  CT, 08/08/2015 FINDINGS: Normal bowel gas pattern. Status post cholecystectomy. No evidence of renal or ureteral stones. Soft tissues otherwise unremarkable. IMPRESSION: 1. No acute finding.  No evidence of bowel obstruction. Electronically Signed   By: Lajean Manes M.D.   On: 08/21/2015 08:51    Microbiology: No results found for this or any previous visit (from the past 240 hour(s)).   Labs: Basic Metabolic Panel:  Recent Labs Lab 08/20/15 1208 08/20/15 1437 08/21/15 0326 08/22/15 0418 08/23/15 0442 08/24/15 0422  NA 137  --  138  139 139 138  K 2.7*  --  3.0* 3.5 3.3* 3.1*  CL 94*  --  102 106 107 104  CO2 27  --  28 27 26 28   GLUCOSE 332*  --  282* 207* 168* 106*  BUN 18  --  18 16 11 8   CREATININE 1.32*  --  1.11* 1.08* 0.88 1.03*  CALCIUM 9.2  --  8.0* 8.1* 8.0* 8.6*  MG  --  1.4*  --  1.7  --  1.2*  PHOS  --  1.8* 2.6  --   --   --    Liver Function Tests:  Recent Labs Lab 08/20/15 1208 08/21/15 0326  AST 22  --   ALT 19  --   ALKPHOS 86  --   BILITOT 0.7  --   PROT 8.9*  --   ALBUMIN 4.3 3.3*    Recent Labs Lab 08/20/15 1208  LIPASE 23   No results for input(s): AMMONIA in the last 168 hours. CBC:  Recent Labs Lab 08/20/15 1208 08/21/15 0326 08/24/15 0422  WBC 14.5* 12.4* 9.4  HGB 11.9* 9.8* 9.2*  HCT 36.6 30.2* 28.4*  MCV 89.1 90.7 91.3  PLT 463* 377 358   Cardiac Enzymes:  Recent Labs Lab 08/20/15 1437  TROPONINI <0.03   BNP: BNP (last 3 results) No results for input(s): BNP in the last 8760 hours.  ProBNP (last 3 results) No results for input(s): PROBNP in the last 8760 hours.  CBG:  Recent Labs Lab 08/23/15 1138 08/23/15 1650 08/23/15 2107 08/24/15 0758 08/24/15 1155  GLUCAP 263* 146* 226* 150* 224*       SignedDebbe Odea, MD [facility] 08/24/2015, 5:27 PM

## 2015-08-26 ENCOUNTER — Encounter (HOSPITAL_COMMUNITY): Payer: Self-pay | Admitting: *Deleted

## 2015-08-26 ENCOUNTER — Other Ambulatory Visit: Payer: Self-pay

## 2015-08-26 ENCOUNTER — Inpatient Hospital Stay (HOSPITAL_COMMUNITY)
Admission: AD | Admit: 2015-08-26 | Discharge: 2015-08-26 | Disposition: A | Discharge status: Home / Self Care | Payer: Medicaid Other | Source: Ambulatory Visit | Attending: Emergency Medicine | Admitting: Emergency Medicine

## 2015-08-26 DIAGNOSIS — R079 Chest pain, unspecified: Secondary | ICD-10-CM | POA: Diagnosis not present

## 2015-08-26 DIAGNOSIS — E119 Type 2 diabetes mellitus without complications: Secondary | ICD-10-CM | POA: Insufficient documentation

## 2015-08-26 DIAGNOSIS — Z8673 Personal history of transient ischemic attack (TIA), and cerebral infarction without residual deficits: Secondary | ICD-10-CM | POA: Insufficient documentation

## 2015-08-26 DIAGNOSIS — R112 Nausea with vomiting, unspecified: Secondary | ICD-10-CM | POA: Insufficient documentation

## 2015-08-26 DIAGNOSIS — I10 Essential (primary) hypertension: Secondary | ICD-10-CM | POA: Insufficient documentation

## 2015-08-26 DIAGNOSIS — Z79899 Other long term (current) drug therapy: Secondary | ICD-10-CM | POA: Insufficient documentation

## 2015-08-26 DIAGNOSIS — R1013 Epigastric pain: Secondary | ICD-10-CM | POA: Diagnosis present

## 2015-08-26 DIAGNOSIS — R1115 Cyclical vomiting syndrome unrelated to migraine: Secondary | ICD-10-CM

## 2015-08-26 LAB — COMPREHENSIVE METABOLIC PANEL
ALBUMIN: 3.8 g/dL (ref 3.5–5.0)
ALK PHOS: 79 U/L (ref 38–126)
ALT: 27 U/L (ref 14–54)
ANION GAP: 12 (ref 5–15)
AST: 34 U/L (ref 15–41)
BILIRUBIN TOTAL: 1 mg/dL (ref 0.3–1.2)
BUN: 11 mg/dL (ref 6–20)
CALCIUM: 9.6 mg/dL (ref 8.9–10.3)
CO2: 25 mmol/L (ref 22–32)
Chloride: 98 mmol/L — ABNORMAL LOW (ref 101–111)
Creatinine, Ser: 0.88 mg/dL (ref 0.44–1.00)
GFR calc Af Amer: >60 mL/min (ref >60)
GFR calc non Af Amer: >60 mL/min (ref >60)
GLUCOSE: 143 mg/dL — AB (ref 65–99)
Potassium: 3.1 mmol/L — ABNORMAL LOW (ref 3.5–5.1)
SODIUM: 135 mmol/L (ref 135–145)
TOTAL PROTEIN: 8.2 g/dL — AB (ref 6.5–8.1)

## 2015-08-26 LAB — CBC WITH DIFFERENTIAL/PLATELET
BASOS ABS: 0.1 10*3/uL (ref 0.0–0.1)
BASOS PCT: 1 %
EOS ABS: 0.3 10*3/uL (ref 0.0–0.7)
Eosinophils Relative: 3 %
HEMATOCRIT: 34.1 % — AB (ref 36.0–46.0)
HEMOGLOBIN: 11 g/dL — AB (ref 12.0–15.0)
Lymphocytes Relative: 28 %
Lymphs Abs: 3 10*3/uL (ref 0.7–4.0)
MCH: 29.3 pg (ref 26.0–34.0)
MCHC: 32.3 g/dL (ref 30.0–36.0)
MCV: 90.7 fL (ref 78.0–100.0)
Monocytes Absolute: 0.4 10*3/uL (ref 0.1–1.0)
Monocytes Relative: 4 %
NEUTROS ABS: 6.9 10*3/uL (ref 1.7–7.7)
NEUTROS PCT: 64 %
Platelets: 437 10*3/uL — ABNORMAL HIGH (ref 150–400)
RBC: 3.76 MIL/uL — AB (ref 3.87–5.11)
RDW: 14.6 % (ref 11.5–15.5)
WBC: 10.6 10*3/uL — AB (ref 4.0–10.5)

## 2015-08-26 LAB — URINALYSIS, ROUTINE W REFLEX MICROSCOPIC
Bilirubin Urine: NEGATIVE
Glucose, UA: NEGATIVE mg/dL
Ketones, ur: NEGATIVE mg/dL
Leukocytes, UA: NEGATIVE
Nitrite: NEGATIVE
Protein, ur: 30 mg/dL — AB
Specific Gravity, Urine: 1.011 (ref 1.005–1.030)
pH: 7 (ref 5.0–8.0)

## 2015-08-26 LAB — URINE MICROSCOPIC-ADD ON

## 2015-08-26 LAB — LIPASE, BLOOD: Lipase: 27 U/L (ref 11–51)

## 2015-08-26 LAB — MAGNESIUM: Magnesium: 1.1 mg/dL — ABNORMAL LOW (ref 1.7–2.4)

## 2015-08-26 MED ORDER — POTASSIUM CHLORIDE 10 MEQ/100ML IV SOLN
10.0000 meq | Freq: Once | INTRAVENOUS | Status: AC
  Administered 2015-08-26: 10 meq via INTRAVENOUS
  Filled 2015-08-26: qty 100

## 2015-08-26 MED ORDER — MAGNESIUM SULFATE 50 % IJ SOLN: 2.0000 g | Freq: Once | INTRAMUSCULAR | Status: DC

## 2015-08-26 MED ORDER — SODIUM CHLORIDE 0.9 % IV SOLN
INTRAVENOUS | Status: DC
  Administered 2015-08-26: 13:00:00 via INTRAVENOUS

## 2015-08-26 MED ORDER — FAMOTIDINE IN NACL 20-0.9 MG/50ML-% IV SOLN
20.0000 mg | Freq: Once | INTRAVENOUS | Status: AC
  Administered 2015-08-26: 20 mg via INTRAVENOUS
  Filled 2015-08-26: qty 50

## 2015-08-26 MED ORDER — MAGNESIUM SULFATE 2 GM/50ML IV SOLN
2.0000 g | Freq: Once | INTRAVENOUS | Status: AC
  Administered 2015-08-26: 2 g via INTRAVENOUS
  Filled 2015-08-26: qty 50

## 2015-08-26 MED ORDER — ONDANSETRON HCL 4 MG/2ML IJ SOLN
4.0000 mg | Freq: Once | INTRAMUSCULAR | Status: AC
  Administered 2015-08-26: 4 mg via INTRAVENOUS
  Filled 2015-08-26: qty 2

## 2015-08-26 MED ORDER — HYDROMORPHONE HCL 1 MG/ML IJ SOLN
1.0000 mg | Freq: Once | INTRAMUSCULAR | Status: AC
  Administered 2015-08-26: 1 mg via INTRAVENOUS
  Filled 2015-08-26: qty 1

## 2015-08-26 MED ORDER — HALOPERIDOL LACTATE 5 MG/ML IJ SOLN
4.0000 mg | Freq: Once | INTRAMUSCULAR | Status: AC
  Administered 2015-08-26: 4 mg via INTRAVENOUS
  Filled 2015-08-26: qty 1

## 2015-08-26 NOTE — Discharge Instructions (Signed)
Abdominal Pain, Adult Many things can cause abdominal pain. Usually, abdominal pain is not caused by a disease and will improve without treatment. It can often be observed and treated at home. Your health care provider will do a physical exam and possibly order blood tests and X-rays to help determine the seriousness of your pain. However, in many cases, more time must pass before a clear cause of the pain can be found. Before that point, your health care provider may not know if you need more testing or further treatment. HOME CARE INSTRUCTIONS Monitor your abdominal pain for any changes. The following actions may help to alleviate any discomfort you are experiencing:  Only take over-the-counter or prescription medicines as directed by your health care provider.  Do not take laxatives unless directed to do so by your health care provider.  Try a clear liquid diet (broth, tea, or water) as directed by your health care provider. Slowly move to a bland diet as tolerated. SEEK MEDICAL CARE IF:  You have unexplained abdominal pain.  You have abdominal pain associated with nausea or diarrhea.  You have pain when you urinate or have a bowel movement.  You experience abdominal pain that wakes you in the night.  You have abdominal pain that is worsened or improved by eating food.  You have abdominal pain that is worsened with eating fatty foods.  You have a fever. SEEK IMMEDIATE MEDICAL CARE IF:  Your pain does not go away within 2 hours.  You keep throwing up (vomiting).  Your pain is felt only in portions of the abdomen, such as the right side or the left lower portion of the abdomen.  You pass bloody or black tarry stools. MAKE SURE YOU:  Understand these instructions.  Will watch your condition.  Will get help right away if you are not doing well or get worse.   This information is not intended to replace advice given to you by your health care provider. Make sure you discuss  any questions you have with your health care provider.   Follow-up with your gastroenterologist or primary care doctor as soon as possible for reevaluation. Take home oxycodone and Reglan as needed for pain and nausea. Encourage bland diet. Avoid spicy foods, alcohol. Return to the emergency department if you experience fever, chills, severe worsening of your pain, blood in your vomit, blood in your stool.

## 2015-08-26 NOTE — MAU Note (Signed)
Chest pain, woke her at 0300.  Is having stomach pain.  Feeling very nauseous

## 2015-08-26 NOTE — ED Notes (Signed)
Pt brought by [facility] from [facility] hospital. where she was treated for chest pain and emesis. Per EMS pt was admitted recently to hospital for GI bleed.

## 2015-08-26 NOTE — ED Notes (Signed)
Bed: WA03 Expected date:  Expected time:  Means of arrival:  Comments: Pt from [facility]

## 2015-08-26 NOTE — MAU Provider Note (Signed)
[name]: [id number]     Arrival date & time 08/26/15  1240 History   None    Chief Complaint  Patient presents with  . Chest Pain     (Consider location/radiation/quality/duration/timing/severity/associated sxs/prior Treatment) HPI Deborah [name] is a 50 y.o. with hx of diabetic gastroparesis, HTN, CVA, Peptic ulcer and Cholecystectomy 08/07/15 presents to the MAU with chest pain that woke her at 3 am and vomiting blood.  She was recently admitted to the hospital for n/v and epigastric pain. Patient reports that her PCP is in [place]. She went to [facility] and was admitted 6/14 and d/c 6/18. Patient states she came to [facility] because someone told her she could get seen and find out what was wrong her quicker.   Past Medical History  Diagnosis Date  . Diabetes mellitus   . Hypertension   . Stroke ([place]) 02/2013  . Pneumonia   . Gastric ulcer    Past Surgical History  Procedure Laterality Date  . Cataract extraction  01/2014  . Cholecystectomy     Family History  Problem Relation Age of Onset  . Diabetes Mother   . Diabetes Father   . Heart disease Father   . Diabetes Sister   . Diabetes Brother    Social History  Substance Use Topics  . Smoking status: Never Smoker   . Smokeless tobacco: Never Used  . Alcohol Use: No   OB History    Gravida Para Term Preterm AB TAB SAB Ectopic Multiple Living   0 0 0 0 0 0 0 0 0 0      Review of Systems  Respiratory: Positive for shortness of breath.   Cardiovascular: Positive for chest pain.  Gastrointestinal: Positive for nausea, vomiting and abdominal pain.  all other systems negative    Allergies  Penicillins; Valium; Lisinopril; Food; and Morphine and related  Home Medications   Prior to Admission medications   Medication Sig Start Date End Date Taking? Authorizing Provider  albuterol (PROVENTIL HFA;VENTOLIN HFA) 108 (90 BASE) MCG/ACT inhaler Inhale 2 puffs into the lungs every 6 (six) hours as needed for wheezing or shortness  of breath.    Historical Provider, MD  allopurinol (ZYLOPRIM) 100 MG tablet Take 100 mg by mouth daily. 02/14/15   Historical Provider, MD  amLODipine (NORVASC) 10 MG tablet Take 10 mg by mouth daily. 02/11/15   Historical Provider, MD  DULoxetine (CYMBALTA) 30 MG capsule Take 30 mg by mouth 2 (two) times daily.  01/16/15   Historical Provider, MD  hydrochlorothiazide (HYDRODIURIL) 25 MG tablet Take 1 tablet (25 mg total) by mouth daily. 09/27/13   Haydee Monica, MD  metoCLOPramide (REGLAN) 10 MG tablet Take 1 tablet (10 mg total) by mouth 4 (four) times daily -  before meals and at bedtime. Take 30 min prior to eating 08/24/15   Debbe Odea, MD  metoprolol succinate (TOPROL-XL) 25 MG 24 hr tablet Take 25 mg by mouth daily.    Historical Provider, MD  NOVOLOG MIX 70/30 (70-30) 100 UNIT/ML injection Inject 50-75 Units as directed 3 (three) times daily after meals. 75 units every morning, 60 units with lunch, and 50 units with dinner 02/17/15   Historical Provider, MD  oxyCODONE (ROXICODONE) 5 MG immediate release tablet Take 5 mg by mouth every 8 (eight) hours as needed for moderate pain.  08/07/15 08/06/16  Historical Provider, MD  pantoprazole (PROTONIX) 40 MG tablet Take 40 mg by mouth 2 (two) times daily. 05/23/15   Historical Provider, MD  pravastatin (PRAVACHOL) 10 MG tablet Take 10 mg by mouth daily. 05/22/15   Historical Provider, MD  ranitidine (ZANTAC) 150 MG tablet Take 150 mg by mouth daily as needed for heartburn.     Historical Provider, MD  senna (SENOKOT) 8.6 MG tablet Take 1 tablet by mouth 2 (two) times daily. 08/07/15 08/06/16  Historical Provider, MD  sucralfate (CARAFATE) 1 g tablet Take 1 g by mouth 4 (four) times daily. 07/16/15   Historical Provider, MD  Vitamin D, Ergocalciferol, (DRISDOL) 50000 UNITS CAPS capsule Take 50,000 Units by mouth every 7 (seven) days.    Historical Provider, MD   BP 174/100 mmHg  Pulse 115  Temp(Src) 98.8 F (37.1 C) (Oral)  Resp 22  SpO2 100%  LMP  10/10/2012 Physical Exam  Constitutional: She is oriented to person, place, and time. No distress.  Morbidly obese AA/F hyperventilating on arrival to MAU.   HENT:  Head: Normocephalic and atraumatic.  Eyes: EOM are normal.  Neck: Normal range of motion. Neck supple. Normal carotid pulses and no JVD present. Carotid bruit is not present.  Cardiovascular: Tachycardia present.   Pulmonary/Chest: Effort normal. No respiratory distress. She has no wheezes. She has no rales.  Abdominal: Soft. Bowel sounds are normal. There is tenderness in the epigastric area. There is no rigidity, no rebound, no guarding and no CVA tenderness.  Musculoskeletal: Normal range of motion.  Neurological: She is alert and oriented to person, place, and time. No cranial nerve deficit.  Skin: Skin is warm and dry.  Psychiatric: She has a normal mood and affect. Her behavior is normal.  Nursing note and vitals reviewed.   ED Course  Procedures (including critical care time)  EKG, Labs, Saline lock, Pepcid 20 mg IV, Zofran 4 mg IV  Labs Review Results for orders placed or performed during the hospital encounter of 08/26/15 (from the past 24 hour(s))  CBC with Differential/Platelet     Status: Abnormal   Collection Time: 08/26/15  1:29 PM  Result Value Ref Range   WBC 10.6 (H) 4.0 - 10.5 K/uL   RBC 3.76 (L) 3.87 - 5.11 MIL/uL   Hemoglobin 11.0 (L) 12.0 - 15.0 g/dL   HCT 34.1 (L) 36.0 - 46.0 %   MCV 90.7 78.0 - 100.0 fL   MCH 29.3 26.0 - 34.0 pg   MCHC 32.3 30.0 - 36.0 g/dL   RDW 14.6 11.5 - 15.5 %   Platelets 437 (H) 150 - 400 K/uL   Neutrophils Relative % 64 %   Neutro Abs 6.9 1.7 - 7.7 K/uL   Lymphocytes Relative 28 %   Lymphs Abs 3.0 0.7 - 4.0 K/uL   Monocytes Relative 4 %   Monocytes Absolute 0.4 0.1 - 1.0 K/uL   Eosinophils Relative 3 %   Eosinophils Absolute 0.3 0.0 - 0.7 K/uL   Basophils Relative 1 %   Basophils Absolute 0.1 0.0 - 0.1 K/uL  Comprehensive metabolic panel     Status: Abnormal    Collection Time: 08/26/15  1:29 PM  Result Value Ref Range   Sodium 135 135 - 145 mmol/L   Potassium 3.1 (L) 3.5 - 5.1 mmol/L   Chloride 98 (L) 101 - 111 mmol/L   CO2 25 22 - 32 mmol/L   Glucose, Bld 143 (H) 65 - 99 mg/dL   BUN 11 6 - 20 mg/dL   Creatinine, Ser 0.88 0.44 - 1.00 mg/dL   Calcium 9.6 8.9 - 10.3 mg/dL   Total Protein 8.2 (H) 6.5 -  8.1 g/dL   Albumin 3.8 3.5 - 5.0 g/dL   AST 34 15 - 41 U/L   ALT 27 14 - 54 U/L   Alkaline Phosphatase 79 38 - 126 U/L   Total Bilirubin 1.0 0.3 - 1.2 mg/dL   GFR calc non Af Amer >60 >60 mL/min   GFR calc Af Amer >60 >60 mL/min   Anion gap 12 5 - 15  Lipase, blood     Status: None   Collection Time: 08/26/15  1:29 PM  Result Value Ref Range   Lipase 27 11 - 51 U/L     MDM  50 y.o. female with epigastric pain, n/v that started approximately 12 hours prior to arrival to MAU stable for transfer with no ST elevation on EKG and Hgb improved from 2 days ago when d/c from [place]. Will transfer via [place].  Discussed with Dr. Rogene Houston and he will accept the patient.   Final diagnoses:  Abdominal pain, epigastric

## 2015-08-26 NOTE — MAU Note (Signed)
Urine sent to lab 

## 2015-08-26 NOTE — ED Provider Notes (Signed)
[name]: [id number]     Arrival date & time 08/26/15  1240 History   First MD Initiated Contact with Patient 08/26/15 1525     Chief Complaint  Patient presents with  . Chest Pain     (Consider location/radiation/quality/duration/timing/severity/associated sxs/prior Treatment) HPI  Deborah [name] is a 50 y.o F with a pmhx of obesity, gastroparesis, DM, HTN who presents to the ED today c/o upper abd pain, chest pain and vomiting onset this morning. Pt states that she was recetly discharged from the hospital 2 days ago for similar symptoms. Pt was feeling better until this morning when she woke up vomiting and was unable to stop. Pt was seen at [facility] hospital prior to coming here. Pt feels like this is an exacerbation of her chronic gastroparesis. Pt recently had her gallbladder removed without relief of her symptoms. She denies melena or hematochezia.    Past Medical History  Diagnosis Date  . Diabetes mellitus   . Hypertension   . Stroke ([place]) 02/2013  . Pneumonia   . Gastric ulcer    Past Surgical History  Procedure Laterality Date  . Cataract extraction  01/2014  . Cholecystectomy     Family History  Problem Relation Age of Onset  . Diabetes Mother   . Diabetes Father   . Heart disease Father   . Diabetes Sister   . Diabetes Brother    Social History  Substance Use Topics  . Smoking status: Never Smoker   . Smokeless tobacco: Never Used  . Alcohol Use: No   OB History    Gravida Para Term Preterm AB TAB SAB Ectopic Multiple Living   0 0 0 0 0 0 0 0 0 0      Review of Systems  All other systems reviewed and are negative.     Allergies  Penicillins; Valium; Lisinopril; Food; and Morphine and related  Home Medications   Prior to Admission medications   Medication Sig Start Date End Date Taking? Authorizing Provider  allopurinol (ZYLOPRIM) 100 MG tablet Take 100 mg by mouth daily. 02/14/15  Yes Historical Provider, MD  amLODipine (NORVASC) 10 MG tablet  Take 10 mg by mouth daily. 02/11/15  Yes Historical Provider, MD  DULoxetine (CYMBALTA) 30 MG capsule Take 30 mg by mouth 2 (two) times daily.  01/16/15  Yes Historical Provider, MD  hydrochlorothiazide (HYDRODIURIL) 25 MG tablet Take 1 tablet (25 mg total) by mouth daily. 09/27/13  Yes Radhika Concha Se, MD  metoCLOPramide (REGLAN) 10 MG tablet Take 1 tablet (10 mg total) by mouth 4 (four) times daily -  before meals and at bedtime. Take 30 min prior to eating 08/24/15  Yes Debbe Odea, MD  metoprolol succinate (TOPROL-XL) 25 MG 24 hr tablet Take 25 mg by mouth daily.   Yes Historical Provider, MD  NOVOLOG MIX 70/30 (70-30) 100 UNIT/ML injection Inject 50-75 Units as directed 3 (three) times daily after meals. 75 units every morning, 60 units with lunch, and 50 units with dinner 02/17/15  Yes Historical Provider, MD  oxyCODONE (ROXICODONE) 5 MG immediate release tablet Take 5 mg by mouth every 8 (eight) hours as needed for moderate pain.  08/07/15 08/06/16 Yes Historical Provider, MD  pantoprazole (PROTONIX) 40 MG tablet Take 40 mg by mouth 2 (two) times daily. 05/23/15  Yes Historical Provider, MD  pravastatin (PRAVACHOL) 10 MG tablet Take 10 mg by mouth daily. 05/22/15  Yes Historical Provider, MD  senna (SENOKOT) 8.6 MG tablet Take 1 tablet by mouth 2 (  two) times daily. 08/07/15 08/06/16 Yes Historical Provider, MD  sucralfate (CARAFATE) 1 g tablet Take 1 g by mouth 4 (four) times daily. 07/16/15  Yes Historical Provider, MD  Vitamin D, Ergocalciferol, (DRISDOL) 50000 UNITS CAPS capsule Take 50,000 Units by mouth every 7 (seven) days.   Yes Historical Provider, MD  albuterol (PROVENTIL HFA;VENTOLIN HFA) 108 (90 BASE) MCG/ACT inhaler Inhale 2 puffs into the lungs every 6 (six) hours as needed for wheezing or shortness of breath.    Historical Provider, MD  ranitidine (ZANTAC) 150 MG tablet Take 150 mg by mouth daily as needed for heartburn.     Historical Provider, MD   BP 161/92 mmHg  Pulse 112  Temp(Src)  98.8 F (37.1 C) (Oral)  Resp 20  SpO2 100%  LMP 10/10/2012 Physical Exam  Constitutional: She is oriented to person, place, and time. No distress.  Obese female, writhing in discomfort  HENT:  Head: Normocephalic and atraumatic.  Mouth/Throat: No oropharyngeal exudate.  Eyes: Conjunctivae and EOM are normal. Pupils are equal, round, and reactive to light. Right eye exhibits no discharge. Left eye exhibits no discharge. No scleral icterus.  Cardiovascular: Normal rate, regular rhythm, normal heart sounds and intact distal pulses.  Exam reveals no gallop and no friction rub.   No murmur heard. Pulmonary/Chest: Effort normal and breath sounds normal. No respiratory distress. She has no wheezes. She has no rales. She exhibits no tenderness.  Abdominal: Soft. Bowel sounds are normal. She exhibits no distension. There is tenderness ( epigastric and suprapubic TTP). There is no guarding.  Musculoskeletal: Normal range of motion. She exhibits no edema.  Neurological: She is alert and oriented to person, place, and time.  Skin: Skin is warm and dry. No rash noted. She is not diaphoretic. No erythema. No pallor.  Psychiatric: She has a normal mood and affect. Her behavior is normal.  Nursing note and vitals reviewed.   ED Course  Procedures (including critical care time) Labs Review Labs Reviewed  CBC WITH DIFFERENTIAL/PLATELET - Abnormal; Notable for the following:    WBC 10.6 (*)    RBC 3.76 (*)    Hemoglobin 11.0 (*)    HCT 34.1 (*)    Platelets 437 (*)    All other components within normal limits  COMPREHENSIVE METABOLIC PANEL - Abnormal; Notable for the following:    Potassium 3.1 (*)    Chloride 98 (*)    Glucose, Bld 143 (*)    Total Protein 8.2 (*)    All other components within normal limits  LIPASE, BLOOD  MAGNESIUM    Imaging Review No results found. I have personally reviewed and evaluated these images and lab results as part of my medical decision-making.   EKG  Interpretation None      MDM   Final diagnoses:  Abdominal pain, epigastric    50 y.o F with pmhx of DM, chronic abdominal pain with multiple ED visits for same presents to the ED c/o abd pain and vomiting. Pt was recently discharged from the hospital for same symptoms. Patient states when she woke up this morning she began vomiting and then subsequently developed severe epigastric and lower abdominal pain. Patient states that this episode feels similar to her previous episodes. She has been diagnosed with likely gastroparesis. A presentation to ED patient appears uncomfortable. She was given Dilaudid and Zofran with significant relief of her symptoms. Potassium a little low at 3.1, repleted in the ED. Magnesium also low at 1.1 which was also repleted  in the ED. These values are similar to previous values when compared to prior presentations. No further episodes of emesis noted in the ED. Upon reevaluation patient stated her pain was returning and she was given 4 mg of Haldol. This seemed to significantly improve her symptoms. She is now resting comfortably without any further episodes of emesis. Abdomen is soft and nontender. Philip patient is safe for discharge with close gastroenterology follow-up. She was given prescription for Reglan and discharged from admission 2 days ago. She also has home oxycodone that she can take as needed for pain. Is consistent with patient who is agreeable. Return precautions outlined in patient discharge instructions.  Case discussed with Dr. Rogene Houston who agrees with treatment plan.   Dondra Spry [place], PA-C 08/27/15 [place], MD 08/27/15 1751

## 2015-08-26 NOTE — ED Notes (Addendum)
Pt refused PO fluid challenge; stated that she didn't feel like drinking at this time

## 2015-08-28 IMAGING — CR DG CHEST 2V
2 series · 2 of 2 positions shown · non-contrast
Comparison: 03/05/2013

CLINICAL DATA: Chest pain

EXAM:
CHEST  2 VIEW

[w chest lat]
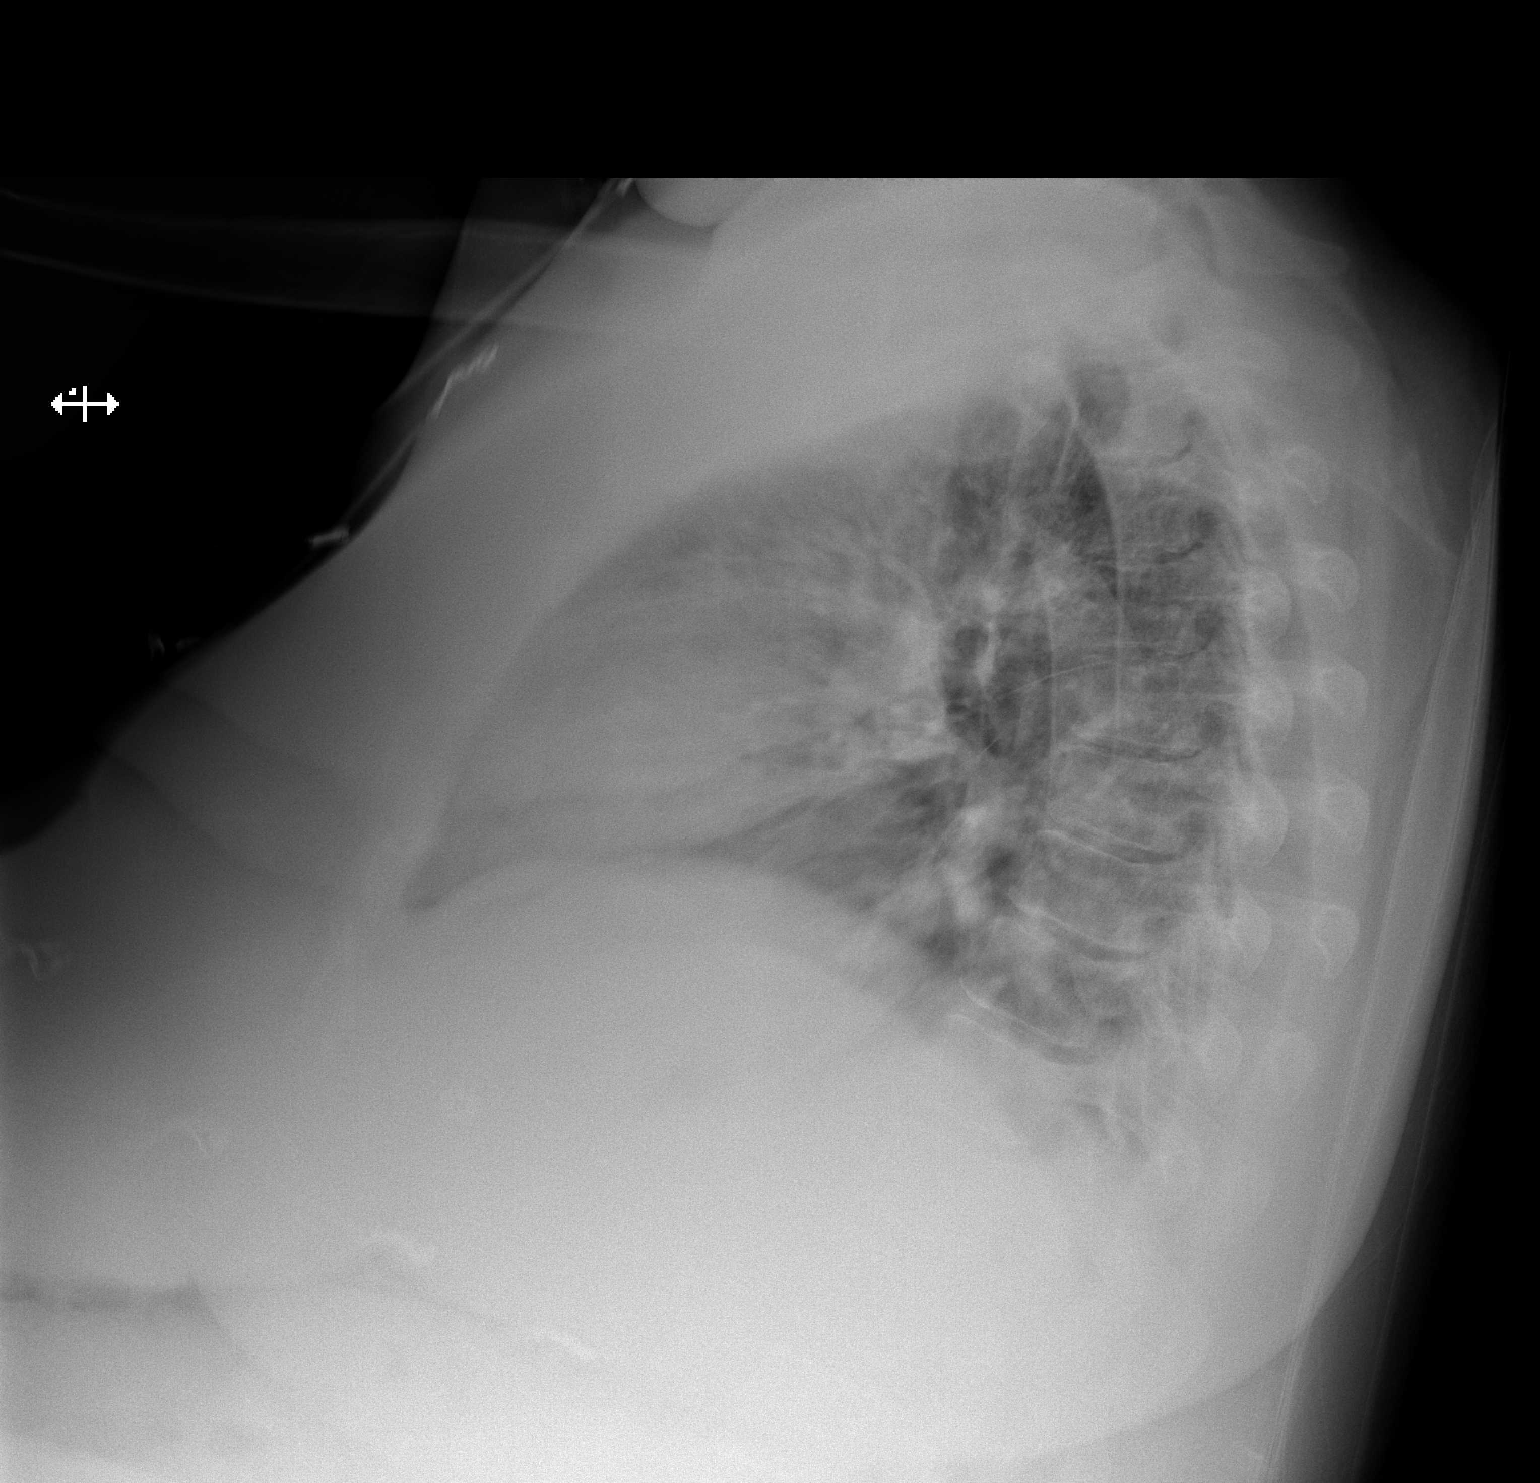

[x chest ap]
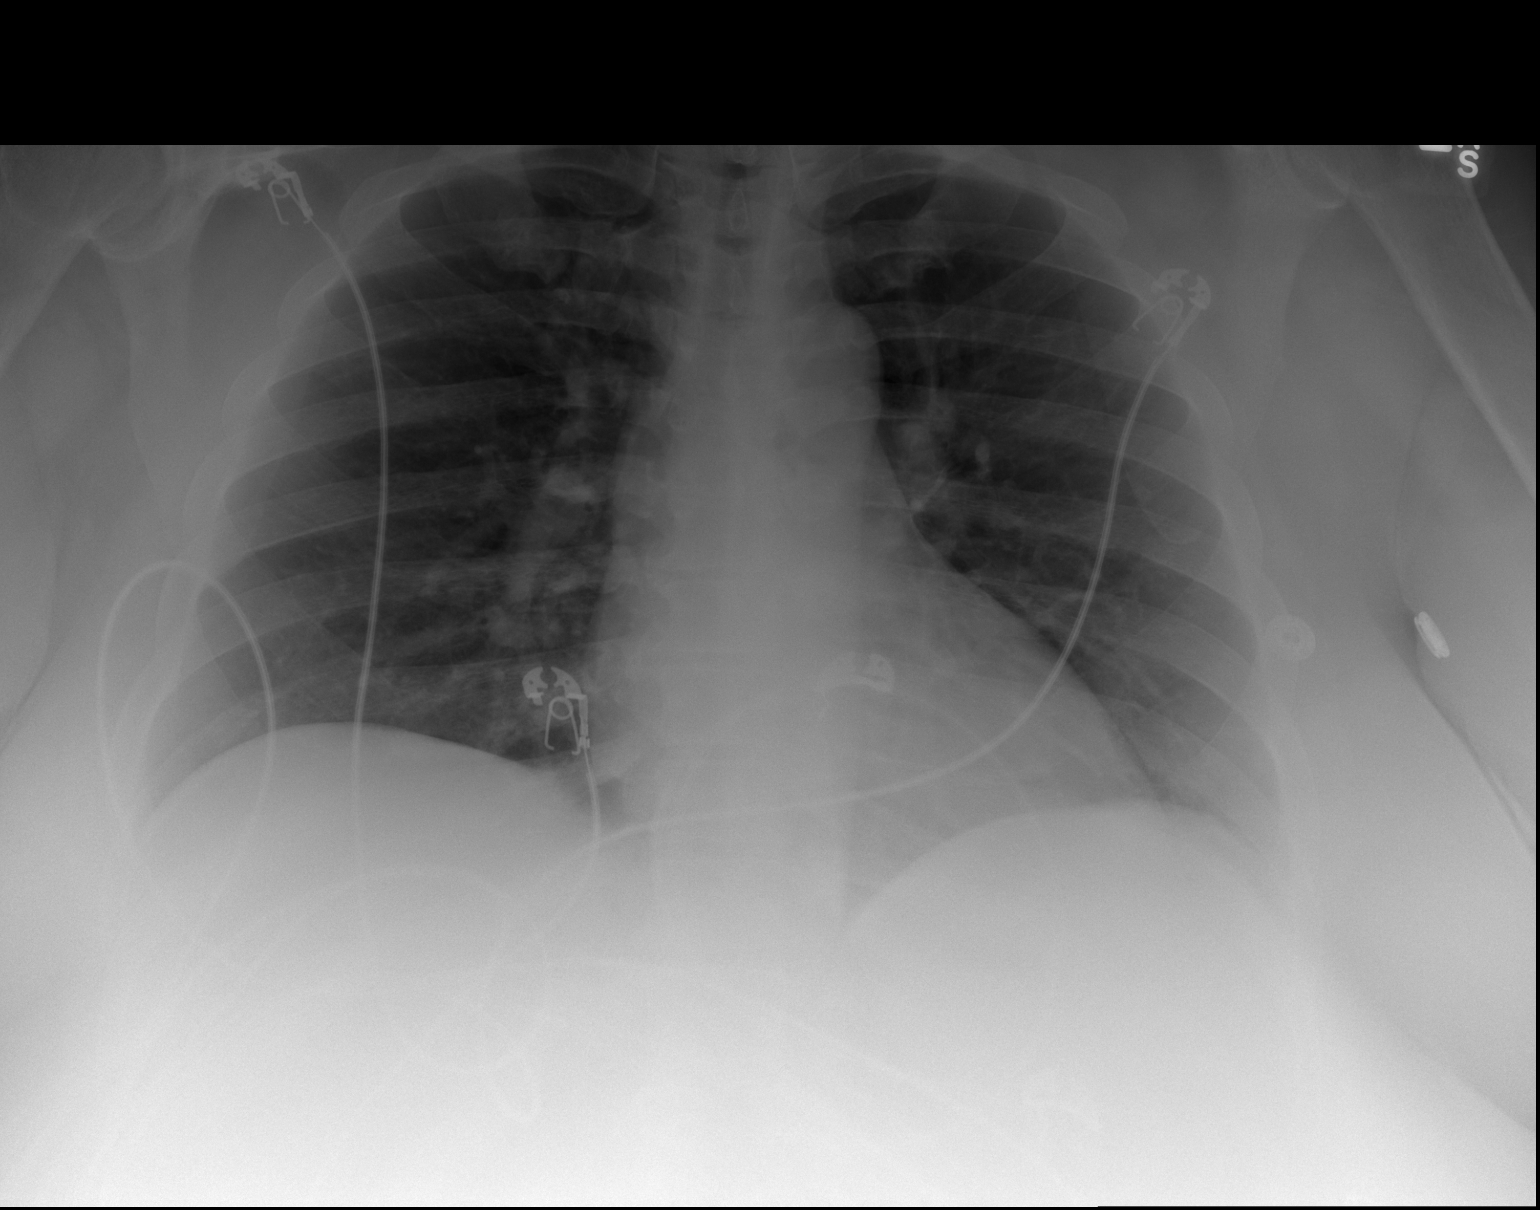

[2 of 2 positions shown; findings below may reference images not displayed]

FINDINGS: Cardiac shadow is stable. The lungs are clear bilaterally. No focal
infiltrate or sizable effusion is noted. No acute bony abnormality
is seen.
IMPRESSION: No active cardiopulmonary disease.

## 2015-09-21 ENCOUNTER — Encounter (HOSPITAL_COMMUNITY): Payer: Self-pay | Admitting: Emergency Medicine

## 2015-09-21 ENCOUNTER — Inpatient Hospital Stay (HOSPITAL_COMMUNITY)
Admission: EM | Admit: 2015-09-21 | Discharge: 2015-09-23 | DRG: 074 | Disposition: A | Discharge status: Home / Self Care | Payer: Medicaid Other | Attending: Internal Medicine | Admitting: Internal Medicine

## 2015-09-21 ENCOUNTER — Emergency Department (HOSPITAL_COMMUNITY): Payer: Medicaid Other

## 2015-09-21 DIAGNOSIS — Z8249 Family history of ischemic heart disease and other diseases of the circulatory system: Secondary | ICD-10-CM

## 2015-09-21 DIAGNOSIS — E1169 Type 2 diabetes mellitus with other specified complication: Secondary | ICD-10-CM

## 2015-09-21 DIAGNOSIS — Z886 Allergy status to analgesic agent status: Secondary | ICD-10-CM | POA: Diagnosis not present

## 2015-09-21 DIAGNOSIS — E876 Hypokalemia: Secondary | ICD-10-CM | POA: Diagnosis present

## 2015-09-21 DIAGNOSIS — Z833 Family history of diabetes mellitus: Secondary | ICD-10-CM | POA: Diagnosis not present

## 2015-09-21 DIAGNOSIS — Z79899 Other long term (current) drug therapy: Secondary | ICD-10-CM

## 2015-09-21 DIAGNOSIS — N179 Acute kidney failure, unspecified: Secondary | ICD-10-CM | POA: Diagnosis present

## 2015-09-21 DIAGNOSIS — Z8711 Personal history of peptic ulcer disease: Secondary | ICD-10-CM | POA: Diagnosis not present

## 2015-09-21 DIAGNOSIS — E1143 Type 2 diabetes mellitus with diabetic autonomic (poly)neuropathy: Principal | ICD-10-CM | POA: Diagnosis present

## 2015-09-21 DIAGNOSIS — Z888 Allergy status to other drugs, medicaments and biological substances status: Secondary | ICD-10-CM

## 2015-09-21 DIAGNOSIS — Z885 Allergy status to narcotic agent status: Secondary | ICD-10-CM

## 2015-09-21 DIAGNOSIS — Z6841 Body Mass Index (BMI) 40.0 and over, adult: Secondary | ICD-10-CM

## 2015-09-21 DIAGNOSIS — E119 Type 2 diabetes mellitus without complications: Secondary | ICD-10-CM | POA: Diagnosis present

## 2015-09-21 DIAGNOSIS — I10 Essential (primary) hypertension: Secondary | ICD-10-CM | POA: Diagnosis present

## 2015-09-21 DIAGNOSIS — E785 Hyperlipidemia, unspecified: Secondary | ICD-10-CM | POA: Diagnosis present

## 2015-09-21 DIAGNOSIS — K3184 Gastroparesis: Secondary | ICD-10-CM | POA: Diagnosis present

## 2015-09-21 DIAGNOSIS — N1832 Chronic kidney disease, stage 3b: Secondary | ICD-10-CM | POA: Diagnosis present

## 2015-09-21 DIAGNOSIS — E860 Dehydration: Secondary | ICD-10-CM | POA: Diagnosis present

## 2015-09-21 DIAGNOSIS — E1122 Type 2 diabetes mellitus with diabetic chronic kidney disease: Secondary | ICD-10-CM | POA: Diagnosis not present

## 2015-09-21 DIAGNOSIS — Z880 Allergy status to penicillin: Secondary | ICD-10-CM | POA: Diagnosis not present

## 2015-09-21 DIAGNOSIS — N184 Chronic kidney disease, stage 4 (severe): Secondary | ICD-10-CM | POA: Diagnosis present

## 2015-09-21 DIAGNOSIS — N182 Chronic kidney disease, stage 2 (mild): Secondary | ICD-10-CM | POA: Diagnosis present

## 2015-09-21 DIAGNOSIS — I129 Hypertensive chronic kidney disease with stage 1 through stage 4 chronic kidney disease, or unspecified chronic kidney disease: Secondary | ICD-10-CM | POA: Diagnosis not present

## 2015-09-21 DIAGNOSIS — Z794 Long term (current) use of insulin: Secondary | ICD-10-CM | POA: Diagnosis not present

## 2015-09-21 DIAGNOSIS — E1165 Type 2 diabetes mellitus with hyperglycemia: Secondary | ICD-10-CM | POA: Diagnosis present

## 2015-09-21 DIAGNOSIS — IMO0002 Reserved for concepts with insufficient information to code with codable children: Secondary | ICD-10-CM

## 2015-09-21 DIAGNOSIS — N183 Chronic kidney disease, stage 3 (moderate): Secondary | ICD-10-CM | POA: Diagnosis not present

## 2015-09-21 DIAGNOSIS — Z8673 Personal history of transient ischemic attack (TIA), and cerebral infarction without residual deficits: Secondary | ICD-10-CM | POA: Diagnosis not present

## 2015-09-21 DIAGNOSIS — E1140 Type 2 diabetes mellitus with diabetic neuropathy, unspecified: Secondary | ICD-10-CM

## 2015-09-21 DIAGNOSIS — R109 Unspecified abdominal pain: Secondary | ICD-10-CM

## 2015-09-21 DIAGNOSIS — R112 Nausea with vomiting, unspecified: Secondary | ICD-10-CM

## 2015-09-21 HISTORY — DX: Gastroparesis: K31.84

## 2015-09-21 LAB — URINALYSIS, ROUTINE W REFLEX MICROSCOPIC
GLUCOSE, UA: 250 mg/dL — AB
Ketones, ur: 15 mg/dL — AB
Leukocytes, UA: NEGATIVE
Nitrite: NEGATIVE
Protein, ur: 100 mg/dL — AB
SPECIFIC GRAVITY, URINE: 1.021 (ref 1.005–1.030)
pH: 5.5 (ref 5.0–8.0)

## 2015-09-21 LAB — CBC
HCT: 34.9 % — ABNORMAL LOW (ref 36.0–46.0)
HEMOGLOBIN: 11.5 g/dL — AB (ref 12.0–15.0)
MCH: 30.1 pg (ref 26.0–34.0)
MCHC: 33 g/dL (ref 30.0–36.0)
MCV: 91.4 fL (ref 78.0–100.0)
Platelets: 412 10*3/uL — ABNORMAL HIGH (ref 150–400)
RBC: 3.82 MIL/uL — AB (ref 3.87–5.11)
RDW: 13.6 % (ref 11.5–15.5)
WBC: 10.6 10*3/uL — ABNORMAL HIGH (ref 4.0–10.5)

## 2015-09-21 LAB — CBG MONITORING, ED: Glucose-Capillary: 260 mg/dL — ABNORMAL HIGH (ref 65–99)

## 2015-09-21 LAB — GLUCOSE, CAPILLARY
GLUCOSE-CAPILLARY: 246 mg/dL — AB (ref 65–99)
Glucose-Capillary: 236 mg/dL — ABNORMAL HIGH (ref 65–99)
Glucose-Capillary: 169 mg/dL — ABNORMAL HIGH (ref 65–99)

## 2015-09-21 LAB — COMPREHENSIVE METABOLIC PANEL
ALBUMIN: 4.2 g/dL (ref 3.5–5.0)
ALT: 21 U/L (ref 14–54)
AST: 21 U/L (ref 15–41)
Alkaline Phosphatase: 70 U/L (ref 38–126)
Anion gap: 9 (ref 5–15)
BUN: 9 mg/dL (ref 6–20)
CHLORIDE: 102 mmol/L (ref 101–111)
CO2: 27 mmol/L (ref 22–32)
CREATININE: 1.43 mg/dL — AB (ref 0.44–1.00)
Calcium: 8.9 mg/dL (ref 8.9–10.3)
GFR calc non Af Amer: 42 mL/min — ABNORMAL LOW (ref >60)
GFR, EST AFRICAN AMERICAN: 48 mL/min — AB (ref >60)
Glucose, Bld: 277 mg/dL — ABNORMAL HIGH (ref 65–99)
Potassium: 2.8 mmol/L — ABNORMAL LOW (ref 3.5–5.1)
SODIUM: 138 mmol/L (ref 135–145)
Total Bilirubin: 0.6 mg/dL (ref 0.3–1.2)
Total Protein: 8.7 g/dL — ABNORMAL HIGH (ref 6.5–8.1)

## 2015-09-21 LAB — URINE MICROSCOPIC-ADD ON: WBC UA: NONE SEEN WBC/hpf (ref 0–5)

## 2015-09-21 LAB — LIPASE, BLOOD: Lipase: 43 U/L (ref 11–51)

## 2015-09-21 LAB — MAGNESIUM: Magnesium: 1.1 mg/dL — ABNORMAL LOW (ref 1.7–2.4)

## 2015-09-21 MED ORDER — PROMETHAZINE HCL 25 MG/ML IJ SOLN
12.5000 mg | Freq: Four times a day (QID) | INTRAMUSCULAR | Status: DC | PRN
  Filled 2015-09-21: qty 1

## 2015-09-21 MED ORDER — INSULIN GLARGINE 100 UNIT/ML ~~LOC~~ SOLN
20.0000 [IU] | Freq: Every day | SUBCUTANEOUS | Status: DC
  Administered 2015-09-21 – 2015-09-22 (×2): 20 [IU] via SUBCUTANEOUS
  Filled 2015-09-21 (×3): qty 0.2

## 2015-09-21 MED ORDER — SODIUM CHLORIDE 0.9 % IV SOLN: 1000.0000 mL | INTRAVENOUS | Status: DC

## 2015-09-21 MED ORDER — POTASSIUM CHLORIDE 10 MEQ/100ML IV SOLN
10.0000 meq | Freq: Once | INTRAVENOUS | Status: AC
  Administered 2015-09-21: 10 meq via INTRAVENOUS
  Filled 2015-09-21: qty 100

## 2015-09-21 MED ORDER — SODIUM CHLORIDE 0.9 % IV SOLN
INTRAVENOUS | Status: DC
  Administered 2015-09-21 – 2015-09-23 (×4): via INTRAVENOUS

## 2015-09-21 MED ORDER — DULOXETINE HCL 30 MG PO CPEP
30.0000 mg | ORAL_CAPSULE | Freq: Two times a day (BID) | ORAL | Status: DC
  Administered 2015-09-21 – 2015-09-23 (×4): 30 mg via ORAL
  Filled 2015-09-21 (×4): qty 1

## 2015-09-21 MED ORDER — POTASSIUM CHLORIDE CRYS ER 20 MEQ PO TBCR
40.0000 meq | EXTENDED_RELEASE_TABLET | Freq: Once | ORAL | Status: DC
  Filled 2015-09-21: qty 2

## 2015-09-21 MED ORDER — METOCLOPRAMIDE HCL 5 MG/ML IJ SOLN
10.0000 mg | Freq: Once | INTRAMUSCULAR | Status: AC
  Administered 2015-09-21: 10 mg via INTRAVENOUS
  Filled 2015-09-21: qty 2

## 2015-09-21 MED ORDER — POTASSIUM CHLORIDE 10 MEQ/100ML IV SOLN
INTRAVENOUS | Status: AC
  Administered 2015-09-21: 10 meq via INTRAVENOUS
  Filled 2015-09-21: qty 100

## 2015-09-21 MED ORDER — SODIUM CHLORIDE 0.9 % IV SOLN
1000.0000 mL | Freq: Once | INTRAVENOUS | Status: AC
  Administered 2015-09-21: 1000 mL via INTRAVENOUS

## 2015-09-21 MED ORDER — METOPROLOL SUCCINATE ER 25 MG PO TB24
25.0000 mg | ORAL_TABLET | Freq: Every day | ORAL | Status: DC
  Administered 2015-09-21 – 2015-09-22 (×2): 25 mg via ORAL
  Filled 2015-09-21: qty 1

## 2015-09-21 MED ORDER — SODIUM CHLORIDE 0.9% FLUSH: 3.0000 mL | Freq: Two times a day (BID) | INTRAVENOUS | Status: DC

## 2015-09-21 MED ORDER — HYDROMORPHONE HCL 1 MG/ML IJ SOLN
1.0000 mg | Freq: Once | INTRAMUSCULAR | Status: AC
  Administered 2015-09-21: 1 mg via INTRAVENOUS
  Filled 2015-09-21: qty 1

## 2015-09-21 MED ORDER — POTASSIUM CHLORIDE 10 MEQ/100ML IV SOLN
10.0000 meq | INTRAVENOUS | Status: AC
  Administered 2015-09-21 (×6): 10 meq via INTRAVENOUS
  Filled 2015-09-21 (×3): qty 100

## 2015-09-21 MED ORDER — HYDRALAZINE HCL 20 MG/ML IJ SOLN: 10.0000 mg | Freq: Four times a day (QID) | INTRAMUSCULAR | Status: DC | PRN

## 2015-09-21 MED ORDER — ALLOPURINOL 100 MG PO TABS
100.0000 mg | ORAL_TABLET | Freq: Every day | ORAL | Status: DC
  Administered 2015-09-22 – 2015-09-23 (×2): 100 mg via ORAL
  Filled 2015-09-21 (×2): qty 1

## 2015-09-21 MED ORDER — HYDROMORPHONE HCL 1 MG/ML IJ SOLN
0.5000 mg | Freq: Three times a day (TID) | INTRAMUSCULAR | Status: DC | PRN
  Administered 2015-09-21: 0.5 mg via INTRAVENOUS
  Filled 2015-09-21: qty 1

## 2015-09-21 MED ORDER — METOCLOPRAMIDE HCL 5 MG/ML IJ SOLN
5.0000 mg | Freq: Three times a day (TID) | INTRAMUSCULAR | Status: AC
  Administered 2015-09-21 – 2015-09-22 (×3): 5 mg via INTRAVENOUS
  Filled 2015-09-21 (×4): qty 2

## 2015-09-21 MED ORDER — INSULIN ASPART 100 UNIT/ML ~~LOC~~ SOLN
0.0000 [IU] | Freq: Three times a day (TID) | SUBCUTANEOUS | Status: DC
Start: 2015-09-21 — End: 2015-09-23
  Administered 2015-09-21 – 2015-09-22 (×3): 7 [IU] via SUBCUTANEOUS
  Administered 2015-09-22: 11 [IU] via SUBCUTANEOUS
  Administered 2015-09-23: 4 [IU] via SUBCUTANEOUS
  Administered 2015-09-23 (×2): 7 [IU] via SUBCUTANEOUS

## 2015-09-21 MED ORDER — ENOXAPARIN SODIUM 40 MG/0.4ML ~~LOC~~ SOLN
40.0000 mg | SUBCUTANEOUS | Status: DC
  Administered 2015-09-21 – 2015-09-22 (×2): 40 mg via SUBCUTANEOUS
  Filled 2015-09-21 (×2): qty 0.4

## 2015-09-21 MED ORDER — ALBUTEROL SULFATE (2.5 MG/3ML) 0.083% IN NEBU: 2.5000 mg | INHALATION_SOLUTION | Freq: Four times a day (QID) | RESPIRATORY_TRACT | Status: DC | PRN

## 2015-09-21 MED ORDER — AMLODIPINE BESYLATE 10 MG PO TABS
10.0000 mg | ORAL_TABLET | Freq: Every day | ORAL | Status: DC
  Administered 2015-09-21 – 2015-09-22 (×2): 10 mg via ORAL
  Filled 2015-09-21 (×2): qty 1

## 2015-09-21 MED ORDER — DIPHENHYDRAMINE HCL 50 MG/ML IJ SOLN
12.5000 mg | Freq: Once | INTRAMUSCULAR | Status: AC
  Administered 2015-09-21: 12.5 mg via INTRAVENOUS
  Filled 2015-09-21: qty 1

## 2015-09-21 MED ORDER — MAGNESIUM SULFATE 2 GM/50ML IV SOLN
2.0000 g | Freq: Once | INTRAVENOUS | Status: AC
  Administered 2015-09-21: 2 g via INTRAVENOUS
  Filled 2015-09-21: qty 50

## 2015-09-21 MED ORDER — ONDANSETRON HCL 4 MG/2ML IJ SOLN
4.0000 mg | Freq: Once | INTRAMUSCULAR | Status: AC
  Administered 2015-09-21: 4 mg via INTRAVENOUS
  Filled 2015-09-21: qty 2

## 2015-09-21 MED ORDER — HYDROMORPHONE HCL 1 MG/ML IJ SOLN
0.5000 mg | Freq: Four times a day (QID) | INTRAMUSCULAR | Status: DC | PRN
  Administered 2015-09-21 – 2015-09-23 (×6): 0.5 mg via INTRAVENOUS
  Filled 2015-09-21 (×6): qty 1

## 2015-09-21 NOTE — ED Notes (Signed)
Abd pain, nausea, vomiting x 2 weeks. States her CBG has been going up and down, and that she's been using her insulin regularly. Hx of DM II. Abd pain is lower, denies urinary symptoms

## 2015-09-21 NOTE — H&P (Signed)
History and Physical  Deborah [name] [id number] DOB: 16-Dec-1965 DOA: 09/21/2015  Referring physician: EDP PCP: Ernest Haber, MD   Chief Complaint: n/v/ab pain, dehydration  HPI: Deborah [name] is a 50 y.o. female   H/o obesity, insulin dependent diabetes, HTN presented to [facility] ED due to above complaints.  Reported has been on clears since June after her gallbladder surgery, she has constant n/v, not able to keep food down, no significant weight loss, no fever, she does report acid reflux.  ED course: upon arrival she is hypertensive(bp 150/101) and tachycardic (hr114), kub unremarkable, labs with wbc 10.6, k2.8, cr 1,4 (baseline cr wnl), mag 1.1, she is given ivf, reglan, dilaudid, hospitalist called to admit the patient.  Review of Systems:  Detail per HPI, Review of systems are otherwise negative  Past Medical History  Diagnosis Date  . Diabetes mellitus   . Hypertension   . Stroke ([place]) 02/2013  . Pneumonia   . Gastric ulcer   . Gastroparesis    Past Surgical History  Procedure Laterality Date  . Cataract extraction  01/2014  . Cholecystectomy     Social History:  reports that she has never smoked. She has never used smokeless tobacco. She reports that she does not drink alcohol or use illicit drugs. Patient lives at home & is able to participate in activities of daily living independently   Allergies  Allergen Reactions  . Lisinopril Anaphylaxis    Tongue and mouth swelling  . Penicillins Palpitations    Has patient had a PCN reaction causing immediate rash, facial/tongue/throat swelling, SOB or lightheadedness with hypotension: Yes Has patient had a PCN reaction causing severe rash involving mucus membranes or skin necrosis: No Has patient had a PCN reaction that required hospitalization: Yes  Has patient had a PCN reaction occurring within the last 10 years: No   . Valium [Diazepam] [name]  . Food Hives and Swelling    Carrots,  ketchup   . Morphine And Related   . Nsaids Other (See Comments)    ULCER    Family History  Problem Relation Age of Onset  . Diabetes Mother   . Diabetes Father   . Heart disease Father   . Diabetes Sister   . Diabetes Brother       Prior to Admission medications   Medication Sig Start Date End Date Taking? Authorizing Provider  albuterol (PROVENTIL HFA;VENTOLIN HFA) 108 (90 BASE) MCG/ACT inhaler Inhale 2 puffs into the lungs every 6 (six) hours as needed for wheezing or shortness of breath.    Historical Provider, MD  allopurinol (ZYLOPRIM) 100 MG tablet Take 100 mg by mouth daily. 02/14/15   Historical Provider, MD  amLODipine (NORVASC) 10 MG tablet Take 10 mg by mouth daily. 02/11/15   Historical Provider, MD  DULoxetine (CYMBALTA) 30 MG capsule Take 30 mg by mouth 2 (two) times daily.  01/16/15   Historical Provider, MD  hydrochlorothiazide (HYDRODIURIL) 25 MG tablet Take 1 tablet (25 mg total) by mouth daily. 09/27/13   Haydee Monica, MD  metoCLOPramide (REGLAN) 10 MG tablet Take 1 tablet (10 mg total) by mouth 4 (four) times daily -  before meals and at bedtime. Take 30 min prior to eating 08/24/15   Debbe Odea, MD  metoprolol succinate (TOPROL-XL) 25 MG 24 hr tablet Take 25 mg by mouth daily.    Historical Provider, MD  NOVOLOG MIX 70/30 (70-30) 100 UNIT/ML injection Inject 50-75 Units as directed 3 (three) times daily after  meals. 75 units every morning, 60 units with lunch, and 50 units with dinner 02/17/15   Historical Provider, MD  oxyCODONE (ROXICODONE) 5 MG immediate release tablet Take 5 mg by mouth every 8 (eight) hours as needed for moderate pain.  08/07/15 08/06/16  Historical Provider, MD  pantoprazole (PROTONIX) 40 MG tablet Take 40 mg by mouth 2 (two) times daily. 05/23/15   Historical Provider, MD  pravastatin (PRAVACHOL) 10 MG tablet Take 10 mg by mouth daily. 05/22/15   Historical Provider, MD  ranitidine (ZANTAC) 150 MG tablet Take 150 mg by mouth daily as needed for  heartburn.     Historical Provider, MD  senna (SENOKOT) 8.6 MG tablet Take 1 tablet by mouth 2 (two) times daily. 08/07/15 08/06/16  Historical Provider, MD  sucralfate (CARAFATE) 1 g tablet Take 1 g by mouth 4 (four) times daily. 07/16/15   Historical Provider, MD  Vitamin D, Ergocalciferol, (DRISDOL) 50000 UNITS CAPS capsule Take 50,000 Units by mouth every 7 (seven) days.    Historical Provider, MD    Physical Exam: BP 150/101 mmHg  Pulse 114  Temp(Src) 98.2 F (36.8 C) (Oral)  Resp 20  SpO2 100%  LMP 10/10/2012  General:  Drowsy , obese Eyes: PERRL ENT: unremarkable Neck: supple, no JVD Cardiovascular: RRR Respiratory: CTABL Abdomen: diffuse mild tender, no guarding, no rebound, soft/ND, positive bowel sounds Skin: no rash Musculoskeletal:  No edema Psychiatric: calm/cooperative Neurologic: no focal findings            Labs on Admission:  Basic Metabolic Panel:  Recent Labs Lab 09/21/15 1059  NA 138  K 2.8*  CL 102  CO2 27  GLUCOSE 277*  BUN 9  CREATININE 1.43*  CALCIUM 8.9   Liver Function Tests:  Recent Labs Lab 09/21/15 1059  AST 21  ALT 21  ALKPHOS 70  BILITOT 0.6  PROT 8.7*  ALBUMIN 4.2    Recent Labs Lab 09/21/15 1059  LIPASE 43   No results for input(s): AMMONIA in the last 168 hours. CBC:  Recent Labs Lab 09/21/15 1059  WBC 10.6*  HGB 11.5*  HCT 34.9*  MCV 91.4  PLT 412*   Cardiac Enzymes: No results for input(s): CKTOTAL, CKMB, CKMBINDEX, TROPONINI in the last 168 hours.  BNP (last 3 results) No results for input(s): BNP in the last 8760 hours.  ProBNP (last 3 results) No results for input(s): PROBNP in the last 8760 hours.  CBG:  Recent Labs Lab 09/21/15 1036  GLUCAP 260*    Radiological Exams on Admission: Dg Abd 1 View  09/21/2015  CLINICAL DATA:  Abdominal pain, nausea and vomiting EXAM: ABDOMEN - 1 VIEW COMPARISON:  08/21/2015 FINDINGS: Cholecystectomy clips noted The bowel gas pattern is normal. No radio-opaque  calculi or other significant radiographic abnormality are seen. IMPRESSION: Negative. Electronically Signed   By: Kerby Moors M.D.   On: 09/21/2015 11:57    EKG: Independently reviewed. Sinus rhythm, QTC wnl  Assessment/Plan Present on Admission:  **None**  N/v/ab pain: kub unremarkable, likely from diabetic gastroparesis, continue reglan, prn anagesics/antiemtics/ivf. Diet: clears for now   AKI: likely from dehydration, ua not consistence with uti though urine is concentrated. Continue ivf, repeat labs in am  Hypokalemia/hypomagnesemia: replace k/mag  Insulin dependent diabetes: home regimen held, start lantus qhs, add novolog ssi, continue titrate.  HTN: continue norvasc, toprolxl, add hydralazine prn  Morbid obesity: encourage weight loss   DVT prophylaxis: lovenox  Consultants: none  Code Status: full   Family Communication:  Patient   Disposition Plan: admit to med tele  Time spent: 76mins  Lucy Boardman MD, PhD [facility] Pager [phone] If 7PM-7AM, please contact night-coverage at [email or web address], password [facility]

## 2015-09-21 NOTE — ED Provider Notes (Signed)
I saw and evaluated the patient, reviewed the resident's note and I agree with the findings and plan.   EKG Interpretation None      Patient here with 2 weeks of nausea and vomiting due to her known history of gastroparesis. She does not have any current follow-up. She is tachycardic here. Her labs showed hypokalemia as well as evidence of aki. She was given IV fluids as well as IV potassium. Will be admitted to the medicine service  Lacretia Leigh, MD 09/21/15 [phone]

## 2015-09-21 NOTE — ED Provider Notes (Signed)
[name]: [id number]     Arrival date & time 09/21/15  1029 History   First MD Initiated Contact with Patient 09/21/15 1042     Chief Complaint  Patient presents with  . Abdominal Pain  . Nausea  . Emesis     (Consider location/radiation/quality/duration/timing/severity/associated sxs/prior Treatment) HPI   This is a 50 year old female, with history of poorly controled T2DM with gastroparesis, HTN and CKD-3, who presents with a 2 week history of persistent nausea and vomiting. She states whenever she eats or drinks she immediately vomits and reports 8-9 episodes of vomiting per day. She states she has noticed tinges of blood in her emesis. She reports constant lower abdominal pain, rated 7/10, which is made better with laying down. She reports her last solid bowel movement was in June before her cholecystectomy. She states these are the same symptoms she was having before her cholecystectomy.   Past Medical History  Diagnosis Date  . Diabetes mellitus   . Hypertension   . Stroke ([place]) 02/2013  . Pneumonia   . Gastric ulcer   . Gastroparesis    Past Surgical History  Procedure Laterality Date  . Cataract extraction  01/2014  . Cholecystectomy     Family History  Problem Relation Age of Onset  . Diabetes Mother   . Diabetes Father   . Heart disease Father   . Diabetes Sister   . Diabetes Brother    Social History  Substance Use Topics  . Smoking status: Never Smoker   . Smokeless tobacco: Never Used  . Alcohol Use: No   OB History    Gravida Para Term Preterm AB TAB SAB Ectopic Multiple Living   0 0 0 0 0 0 0 0 0 0      Review of Systems  Constitutional: Positive for chills and fatigue. Negative for fever.  Respiratory: Positive for shortness of breath. Negative for cough and wheezing.   Cardiovascular: Negative for chest pain and palpitations.  Gastrointestinal: Positive for nausea, vomiting and abdominal pain. Negative for abdominal distention.  Genitourinary: Negative  for dysuria and difficulty urinating.  Musculoskeletal: Negative for myalgias and neck stiffness.  Skin: Negative for rash.  Neurological: Positive for light-headedness. Negative for syncope.       Allergies  Lisinopril; Penicillins; Valium; Food; Morphine and related; and Nsaids  Home Medications   Prior to Admission medications   Medication Sig Start Date End Date Taking? Authorizing Provider  albuterol (PROVENTIL HFA;VENTOLIN HFA) 108 (90 BASE) MCG/ACT inhaler Inhale 2 puffs into the lungs every 6 (six) hours as needed for wheezing or shortness of breath.    Historical Provider, MD  allopurinol (ZYLOPRIM) 100 MG tablet Take 100 mg by mouth daily. 02/14/15   Historical Provider, MD  amLODipine (NORVASC) 10 MG tablet Take 10 mg by mouth daily. 02/11/15   Historical Provider, MD  DULoxetine (CYMBALTA) 30 MG capsule Take 30 mg by mouth 2 (two) times daily.  01/16/15   Historical Provider, MD  hydrochlorothiazide (HYDRODIURIL) 25 MG tablet Take 1 tablet (25 mg total) by mouth daily. 09/27/13   Haydee Monica, MD  metoCLOPramide (REGLAN) 10 MG tablet Take 1 tablet (10 mg total) by mouth 4 (four) times daily -  before meals and at bedtime. Take 30 min prior to eating 08/24/15   Debbe Odea, MD  metoprolol succinate (TOPROL-XL) 25 MG 24 hr tablet Take 25 mg by mouth daily.    Historical Provider, MD  NOVOLOG MIX 70/30 (70-30) 100 UNIT/ML injection Inject  50-75 Units as directed 3 (three) times daily after meals. 75 units every morning, 60 units with lunch, and 50 units with dinner 02/17/15   Historical Provider, MD  oxyCODONE (ROXICODONE) 5 MG immediate release tablet Take 5 mg by mouth every 8 (eight) hours as needed for moderate pain.  08/07/15 08/06/16  Historical Provider, MD  pantoprazole (PROTONIX) 40 MG tablet Take 40 mg by mouth 2 (two) times daily. 05/23/15   Historical Provider, MD  pravastatin (PRAVACHOL) 10 MG tablet Take 10 mg by mouth daily. 05/22/15   Historical Provider, MD  ranitidine  (ZANTAC) 150 MG tablet Take 150 mg by mouth daily as needed for heartburn.     Historical Provider, MD  senna (SENOKOT) 8.6 MG tablet Take 1 tablet by mouth 2 (two) times daily. 08/07/15 08/06/16  Historical Provider, MD  sucralfate (CARAFATE) 1 g tablet Take 1 g by mouth 4 (four) times daily. 07/16/15   Historical Provider, MD  Vitamin D, Ergocalciferol, (DRISDOL) 50000 UNITS CAPS capsule Take 50,000 Units by mouth every 7 (seven) days.    Historical Provider, MD   BP 150/101 mmHg  Pulse 114  Temp(Src) 98.2 F (36.8 C) (Oral)  Resp 20  SpO2 100%  LMP 10/10/2012 Physical Exam  Constitutional: She appears well-developed and well-nourished.  HENT:  Head: Normocephalic and atraumatic.  Cardiovascular: Normal rate and regular rhythm.   Pulmonary/Chest: Effort normal and breath sounds normal. No respiratory distress. She has no wheezes. She has no rales.  Abdominal: She exhibits no distension. There is tenderness in the right lower quadrant, suprapubic area and left lower quadrant. There is no guarding and no CVA tenderness.  Lymphadenopathy:    She has no cervical adenopathy.  Skin: Skin is warm and dry. She is not diaphoretic.    ED Course  Procedures (including critical care time) Labs Review Labs Reviewed  CBC - Abnormal; Notable for the following:    WBC 10.6 (*)    RBC 3.82 (*)    Hemoglobin 11.5 (*)    HCT 34.9 (*)    Platelets 412 (*)    All other components within normal limits  CBG MONITORING, ED - Abnormal; Notable for the following:    Glucose-Capillary 260 (*)    All other components within normal limits  LIPASE, BLOOD  COMPREHENSIVE METABOLIC PANEL  URINALYSIS, ROUTINE W REFLEX MICROSCOPIC (NOT AT [facility])  CBG MONITORING, ED    Imaging Review Dg Abd 1 View  09/21/2015  CLINICAL DATA:  Abdominal pain, nausea and vomiting EXAM: ABDOMEN - 1 VIEW COMPARISON:  08/21/2015 FINDINGS: Cholecystectomy clips noted The bowel gas pattern is normal. No radio-opaque calculi or other  significant radiographic abnormality are seen. IMPRESSION: Negative. Electronically Signed   By: Kerby Moors M.D.   On: 09/21/2015 11:57   I have personally reviewed and evaluated these images and lab results as part of my medical decision-making.   EKG Interpretation None      MDM   Final diagnoses:  Abdominal pain    Patient admitted to hospitalist service.    Jousha Schwandt, DO 09/21/15 1355  Lacretia Leigh, MD 09/24/15 [phone]

## 2015-09-21 NOTE — ED Notes (Signed)
Patient unsuccessful at collecting urine sample. Will try again later after fluids.

## 2015-09-21 NOTE — ED Notes (Signed)
Patient transported to X-ray 

## 2015-09-21 NOTE — ED Notes (Signed)
Pt getting urine sample

## 2015-09-21 NOTE — Plan of Care (Signed)
Problem: Education: Goal: Knowledge of [facility] Education information/materials will improve Outcome: Completed/Met Date Met:  09/21/15 Patient knowledgeable of current plan of care

## 2015-09-22 DIAGNOSIS — K3184 Gastroparesis: Secondary | ICD-10-CM

## 2015-09-22 DIAGNOSIS — N183 Chronic kidney disease, stage 3 (moderate): Secondary | ICD-10-CM

## 2015-09-22 DIAGNOSIS — E1140 Type 2 diabetes mellitus with diabetic neuropathy, unspecified: Secondary | ICD-10-CM

## 2015-09-22 DIAGNOSIS — R109 Unspecified abdominal pain: Secondary | ICD-10-CM

## 2015-09-22 DIAGNOSIS — Z794 Long term (current) use of insulin: Secondary | ICD-10-CM

## 2015-09-22 DIAGNOSIS — E1165 Type 2 diabetes mellitus with hyperglycemia: Secondary | ICD-10-CM

## 2015-09-22 DIAGNOSIS — E876 Hypokalemia: Secondary | ICD-10-CM

## 2015-09-22 LAB — GLUCOSE, CAPILLARY
GLUCOSE-CAPILLARY: 214 mg/dL — AB (ref 65–99)
GLUCOSE-CAPILLARY: 234 mg/dL — AB (ref 65–99)
GLUCOSE-CAPILLARY: 287 mg/dL — AB (ref 65–99)
GLUCOSE-CAPILLARY: 250 mg/dL — AB (ref 65–99)

## 2015-09-22 LAB — BASIC METABOLIC PANEL
Anion gap: 8 (ref 5–15)
BUN: 8 mg/dL (ref 6–20)
CALCIUM: 8 mg/dL — AB (ref 8.9–10.3)
CHLORIDE: 107 mmol/L (ref 101–111)
CO2: 24 mmol/L (ref 22–32)
CREATININE: 1.01 mg/dL — AB (ref 0.44–1.00)
GFR calc Af Amer: >60 mL/min (ref >60)
GFR calc non Af Amer: >60 mL/min (ref >60)
GLUCOSE: 246 mg/dL — AB (ref 65–99)
Potassium: 3.2 mmol/L — ABNORMAL LOW (ref 3.5–5.1)
Sodium: 139 mmol/L (ref 135–145)

## 2015-09-22 LAB — CBC
HEMATOCRIT: 28.7 % — AB (ref 36.0–46.0)
HEMOGLOBIN: 9.3 g/dL — AB (ref 12.0–15.0)
MCH: 29.6 pg (ref 26.0–34.0)
MCHC: 32.4 g/dL (ref 30.0–36.0)
MCV: 91.4 fL (ref 78.0–100.0)
Platelets: 325 10*3/uL (ref 150–400)
RBC: 3.14 MIL/uL — ABNORMAL LOW (ref 3.87–5.11)
RDW: 14 % (ref 11.5–15.5)
WBC: 9.2 10*3/uL (ref 4.0–10.5)

## 2015-09-22 LAB — MAGNESIUM: MAGNESIUM: 1.8 mg/dL (ref 1.7–2.4)

## 2015-09-22 MED ORDER — SENNA 8.6 MG PO TABS
1.0000 | ORAL_TABLET | Freq: Two times a day (BID) | ORAL | Status: DC
  Administered 2015-09-22 – 2015-09-23 (×3): 8.6 mg via ORAL
  Filled 2015-09-22 (×5): qty 1

## 2015-09-22 MED ORDER — DICYCLOMINE HCL 20 MG PO TABS
20.0000 mg | ORAL_TABLET | Freq: Three times a day (TID) | ORAL | Status: DC
  Filled 2015-09-22: qty 1

## 2015-09-22 MED ORDER — MAGNESIUM SULFATE 2 GM/50ML IV SOLN
2.0000 g | Freq: Once | INTRAVENOUS | Status: AC
  Administered 2015-09-22: 2 g via INTRAVENOUS
  Filled 2015-09-22: qty 50

## 2015-09-22 MED ORDER — METOCLOPRAMIDE HCL 10 MG PO TABS
10.0000 mg | ORAL_TABLET | Freq: Three times a day (TID) | ORAL | Status: DC
  Administered 2015-09-22 – 2015-09-23 (×6): 10 mg via ORAL
  Filled 2015-09-22 (×6): qty 1

## 2015-09-22 MED ORDER — SUCRALFATE 1 G PO TABS
1.0000 g | ORAL_TABLET | Freq: Four times a day (QID) | ORAL | Status: DC
  Administered 2015-09-22 – 2015-09-23 (×6): 1 g via ORAL
  Filled 2015-09-22 (×9): qty 1

## 2015-09-22 MED ORDER — METOPROLOL SUCCINATE ER 25 MG PO TB24
25.0000 mg | ORAL_TABLET | Freq: Every day | ORAL | Status: DC
  Administered 2015-09-23: 25 mg via ORAL
  Filled 2015-09-22: qty 1

## 2015-09-22 MED ORDER — POTASSIUM CHLORIDE 10 MEQ/100ML IV SOLN
10.0000 meq | INTRAVENOUS | Status: AC
  Administered 2015-09-22 (×4): 10 meq via INTRAVENOUS
  Filled 2015-09-22 (×4): qty 100

## 2015-09-22 MED ORDER — PANTOPRAZOLE SODIUM 40 MG PO TBEC: 40.0000 mg | DELAYED_RELEASE_TABLET | Freq: Two times a day (BID) | ORAL | Status: DC

## 2015-09-22 MED ORDER — PANTOPRAZOLE SODIUM 40 MG PO TBEC
40.0000 mg | DELAYED_RELEASE_TABLET | Freq: Two times a day (BID) | ORAL | Status: DC
  Administered 2015-09-22 – 2015-09-23 (×3): 40 mg via ORAL
  Filled 2015-09-22 (×3): qty 1

## 2015-09-22 NOTE — Progress Notes (Signed)
PROGRESS NOTE    Deborah [name]  [id number] DOB: 02/13/1966 DOA: 09/21/2015  PCP: Ernest Haber, MD   Brief Narrative:  51 year old female with a history of diabetes mellitus, hypertension, CVA, recurrent ER visits for abdominal and chest pain, peptic ulcer disease who underwent a cholecystectomy on June 1. She has been admitted multiple times for vomiting and abdominal pain and comes in for this again. She states this has been an ongoing issue since December 2016. She had an EGD performed in January which showed she had a peptic ulcer.  She has been out of her medications for 2 wks now and decided not to return to her PCP as she had no way to pain. She did get an appt with a clinic but is yet to be seen there. Her vomiting has be "so bad" that she had only been managing to drink chicken noodle soup. She is not even able to keep this down now. She was admitted due to hypokalemia and dehydration.   Subjective: Not nauseated this AM. Wanting to try solid food.   Assessment & Plan:  Principal Problem:  Nausea and vomiting - I saw her last month for this same issue- at that time, she never mentioned her financial issues and assured me that she would be able to fill prescriptions and thus, case management was not consulted for assistance -Her GI issues have not resolved with treatment of peptic ulcer disease and a cholecystectomy-  I suspected she may have diabetic gastroparesis and initiated reglan for treatment- she did well on this treatment  -Attempted to obtain gastric emptying study however, she was actively vomiting and subsequently on Reglan, therefore, it could not be performed - continue on oral Reglan 4 times a day   - explained the need for small meals and the avoidance of narcotics in the long run- she appears to understand the reasoning - for PUD, cont PPI and Carafate (IV protonix critical shortage- on oral) - will ask case management to assist with medications and PCP  f/u   Active Problems:  Hypokalemia/ hypomagnesemia - making progress with this- cont to replace   Uncontrolled type 2 diabetes mellitus with diabetic neuropathy, with long-term current use of insulin  -Continue insulin to control sugars  -A1c is 10.3   Dyslipidemia associated with type 2 diabetes mellitus  -  statin    Dehydration with CKD (chronic kidney disease) stage 3, GFR 30-59 ml/min - Mild rise in creatinine on admission is improving with hydration     DVT prophylaxis: lovenox Code Status: full code Family Communication:  Disposition Plan: home Consultants:   none Procedures:   none Antimicrobials:  Anti-infectives    None       Objective: Filed Vitals:   09/21/15 1700 09/21/15 2053 09/21/15 2258 09/22/15 0440  BP: 146/74 120/75  118/76  Pulse:  92  94  Temp:  97.7 F (36.5 C)  98.2 F (36.8 C)  TempSrc:  Oral  Oral  Resp:  16  16  Height:   5\' 6"  (1.676 m)   Weight:   140.615 kg (310 lb)   SpO2:  100%  99%    Intake/Output Summary (Last 24 hours) at 09/22/15 1340 Last data filed at 09/22/15 1238  Gross per 24 hour  Intake   2400 ml  Output      0 ml  Net   2400 ml   Filed Weights   09/21/15 2258  Weight: 140.615 kg (310 lb)    Examination: General  exam: Appears comfortable  HEENT: PERRLA, oral mucosa moist, no sclera icterus or thrush Respiratory system: Clear to auscultation. Respiratory effort normal. Cardiovascular system: S1 & S2 heard, RRR.  No murmurs  Gastrointestinal system: Abdomen soft,  Tender in mid abdomen, nondistended. Normal bowel sound. No organomegaly Central nervous system: Alert and oriented. No focal neurological deficits. Extremities: No cyanosis, clubbing or edema Skin: No rashes or ulcers Psychiatry:  Mood & affect appropriate.     Data Reviewed: I have personally reviewed following labs and imaging studies  CBC:  Recent Labs Lab 09/21/15 1059 09/22/15 0425  WBC 10.6* 9.2  HGB 11.5* 9.3*  HCT  34.9* 28.7*  MCV 91.4 91.4  PLT 412* [id number]   Basic Metabolic Panel:  Recent Labs Lab 09/21/15 1059 09/21/15 1102 09/22/15 0425  NA 138  --  139  K 2.8*  --  3.2*  CL 102  --  107  CO2 27  --  24  GLUCOSE 277*  --  246*  BUN 9  --  8  CREATININE 1.43*  --  1.01*  CALCIUM 8.9  --  8.0*  MG  --  1.1* 1.8   GFR: Estimated Creatinine Clearance: 95.5 mL/min (by C-G formula based on Cr of 1.01). Liver Function Tests:  Recent Labs Lab 09/21/15 1059  AST 21  ALT 21  ALKPHOS 70  BILITOT 0.6  PROT 8.7*  ALBUMIN 4.2    Recent Labs Lab 09/21/15 1059  LIPASE 43   No results for input(s): AMMONIA in the last 168 hours. Coagulation Profile: No results for input(s): INR, PROTIME in the last 168 hours. Cardiac Enzymes: No results for input(s): CKTOTAL, CKMB, CKMBINDEX, TROPONINI in the last 168 hours. BNP (last 3 results) No results for input(s): PROBNP in the last 8760 hours. HbA1C: No results for input(s): HGBA1C in the last 72 hours. CBG:  Recent Labs Lab 09/21/15 1508 09/21/15 1653 09/21/15 2042 09/22/15 0717 09/22/15 1203  GLUCAP 246* 236* 169* 214* 234*   Lipid Profile: No results for input(s): CHOL, HDL, LDLCALC, TRIG, CHOLHDL, LDLDIRECT in the last 72 hours. Thyroid Function Tests: No results for input(s): TSH, T4TOTAL, FREET4, T3FREE, THYROIDAB in the last 72 hours. Anemia Panel: No results for input(s): VITAMINB12, FOLATE, FERRITIN, TIBC, IRON, RETICCTPCT in the last 72 hours. Urine analysis:    Component Value Date/Time   COLORURINE YELLOW 09/21/2015 1654   APPEARANCEUR CLOUDY* 09/21/2015 1654   LABSPEC 1.021 09/21/2015 1654   PHURINE 5.5 09/21/2015 1654   GLUCOSEU 250* 09/21/2015 1654   HGBUR SMALL* 09/21/2015 1654   BILIRUBINUR SMALL* 09/21/2015 1654   KETONESUR 15* 09/21/2015 1654   PROTEINUR 100* 09/21/2015 1654   UROBILINOGEN 0.2 10/02/2013 2108   NITRITE NEGATIVE 09/21/2015 1654   LEUKOCYTESUR NEGATIVE 09/21/2015 1654   Sepsis  Labs: @LABRCNTIP (procalcitonin:4,lacticidven:4) )No results found for this or any previous visit (from the past 240 hour(s)).       Radiology Studies: Dg Abd 1 View  09/21/2015  CLINICAL DATA:  Abdominal pain, nausea and vomiting EXAM: ABDOMEN - 1 VIEW COMPARISON:  08/21/2015 FINDINGS: Cholecystectomy clips noted The bowel gas pattern is normal. No radio-opaque calculi or other significant radiographic abnormality are seen. IMPRESSION: Negative. Electronically Signed   By: Kerby Moors M.D.   On: 09/21/2015 11:57      Scheduled Meds: . allopurinol  100 mg Oral Daily  . DULoxetine  30 mg Oral BID  . enoxaparin (LOVENOX) injection  40 mg Subcutaneous Q24H  . insulin aspart  0-20 Units Subcutaneous  TID WC  . insulin glargine  20 Units Subcutaneous QHS  . metoCLOPramide  10 mg Oral TID AC & HS  . [START ON 09/23/2015] metoprolol succinate  25 mg Oral Daily  . pantoprazole  40 mg Oral BID  . senna  1 tablet Oral BID  . sodium chloride flush  3 mL Intravenous Q12H  . sucralfate  1 g Oral QID   Continuous Infusions: . sodium chloride 125 mL/hr at 09/22/15 0502  . sodium chloride       LOS: 1 day    Time spent in minutes: Hersey, MD [facility] Pager: [email or web address] Password [email or web address] 09/22/2015, 1:40 PM

## 2015-09-23 DIAGNOSIS — E785 Hyperlipidemia, unspecified: Secondary | ICD-10-CM

## 2015-09-23 DIAGNOSIS — I10 Essential (primary) hypertension: Secondary | ICD-10-CM

## 2015-09-23 DIAGNOSIS — E1169 Type 2 diabetes mellitus with other specified complication: Secondary | ICD-10-CM

## 2015-09-23 LAB — GLUCOSE, CAPILLARY
GLUCOSE-CAPILLARY: 186 mg/dL — AB (ref 65–99)
GLUCOSE-CAPILLARY: 212 mg/dL — AB (ref 65–99)
GLUCOSE-CAPILLARY: 165 mg/dL — AB (ref 65–99)
Glucose-Capillary: 224 mg/dL — ABNORMAL HIGH (ref 65–99)

## 2015-09-23 LAB — BASIC METABOLIC PANEL
ANION GAP: 6 (ref 5–15)
BUN: 8 mg/dL (ref 6–20)
CO2: 23 mmol/L (ref 22–32)
Calcium: 8.2 mg/dL — ABNORMAL LOW (ref 8.9–10.3)
Chloride: 108 mmol/L (ref 101–111)
Creatinine, Ser: 0.78 mg/dL (ref 0.44–1.00)
GFR calc Af Amer: >60 mL/min (ref >60)
GLUCOSE: 202 mg/dL — AB (ref 65–99)
POTASSIUM: 3.2 mmol/L — AB (ref 3.5–5.1)
Sodium: 137 mmol/L (ref 135–145)

## 2015-09-23 LAB — CBC
HEMATOCRIT: 28.7 % — AB (ref 36.0–46.0)
HEMOGLOBIN: 9.5 g/dL — AB (ref 12.0–15.0)
MCH: 30 pg (ref 26.0–34.0)
MCHC: 33.1 g/dL (ref 30.0–36.0)
MCV: 90.5 fL (ref 78.0–100.0)
Platelets: 317 10*3/uL (ref 150–400)
RBC: 3.17 MIL/uL — ABNORMAL LOW (ref 3.87–5.11)
RDW: 13.6 % (ref 11.5–15.5)
WBC: 8.6 10*3/uL (ref 4.0–10.5)

## 2015-09-23 LAB — MAGNESIUM: Magnesium: 1.5 mg/dL — ABNORMAL LOW (ref 1.7–2.4)

## 2015-09-23 MED ORDER — SUCRALFATE 1 G PO TABS: 1.0000 g | ORAL_TABLET | Freq: Four times a day (QID) | ORAL | Status: DC

## 2015-09-23 MED ORDER — PANTOPRAZOLE SODIUM 40 MG PO TBEC: 40.0000 mg | DELAYED_RELEASE_TABLET | Freq: Two times a day (BID) | ORAL | Status: DC

## 2015-09-23 MED ORDER — PRAVASTATIN SODIUM 10 MG PO TABS: 10.0000 mg | ORAL_TABLET | Freq: Every day | ORAL | Status: DC

## 2015-09-23 MED ORDER — SENNOSIDES 8.6 MG PO TABS: 1.0000 | ORAL_TABLET | Freq: Two times a day (BID) | ORAL | Status: DC

## 2015-09-23 MED ORDER — ALBUTEROL SULFATE HFA 108 (90 BASE) MCG/ACT IN AERS: 2.0000 | INHALATION_SPRAY | Freq: Four times a day (QID) | RESPIRATORY_TRACT | Status: DC | PRN

## 2015-09-23 MED ORDER — MAGNESIUM SULFATE 2 GM/50ML IV SOLN
2.0000 g | Freq: Once | INTRAVENOUS | Status: AC
  Administered 2015-09-23: 2 g via INTRAVENOUS
  Filled 2015-09-23: qty 50

## 2015-09-23 MED ORDER — POTASSIUM CHLORIDE CRYS ER 20 MEQ PO TBCR
40.0000 meq | EXTENDED_RELEASE_TABLET | ORAL | Status: AC
  Administered 2015-09-23 (×2): 40 meq via ORAL
  Filled 2015-09-23 (×2): qty 2

## 2015-09-23 MED ORDER — VITAMIN D (ERGOCALCIFEROL) 1.25 MG (50000 UNIT) PO CAPS: 50000.0000 [IU] | ORAL_CAPSULE | ORAL | Status: DC

## 2015-09-23 MED ORDER — DICYCLOMINE HCL 20 MG PO TABS: 20.0000 mg | ORAL_TABLET | Freq: Four times a day (QID) | ORAL | Status: DC | PRN

## 2015-09-23 MED ORDER — RANITIDINE HCL 150 MG PO TABS: 150.0000 mg | ORAL_TABLET | Freq: Every day | ORAL | Status: DC | PRN

## 2015-09-23 MED ORDER — NOVOLOG MIX 70/30 (70-30) 100 UNIT/ML ~~LOC~~ SUSP: 50.0000 [IU] | Freq: Three times a day (TID) | SUBCUTANEOUS | Status: DC

## 2015-09-23 MED ORDER — AMLODIPINE BESYLATE 10 MG PO TABS: 10.0000 mg | ORAL_TABLET | Freq: Every day | ORAL | Status: DC

## 2015-09-23 MED ORDER — DULOXETINE HCL 30 MG PO CPEP: 30.0000 mg | ORAL_CAPSULE | Freq: Two times a day (BID) | ORAL | Status: DC

## 2015-09-23 MED ORDER — ALLOPURINOL 100 MG PO TABS: 100.0000 mg | ORAL_TABLET | Freq: Every day | ORAL | Status: DC

## 2015-09-23 MED ORDER — METOPROLOL SUCCINATE ER 25 MG PO TB24: 25.0000 mg | ORAL_TABLET | Freq: Every day | ORAL | Status: DC

## 2015-09-23 MED ORDER — METOCLOPRAMIDE HCL 10 MG PO TABS: 10.0000 mg | ORAL_TABLET | Freq: Three times a day (TID) | ORAL | Status: DC

## 2015-09-23 NOTE — Progress Notes (Addendum)
Spoke with pt concerning appointment with MD for PCP.  Pt's states appointment is for next week not sure when.  A call was made to [facility] for appointments and information concerning pharmacy.  Pt's appointment is for the tomorrow 09/24/15 at 10 AM.  If pt is unable to keep this appointment, she will not be able to get another appointment until August 20th.  Pt can purchase medications at the clinic.  Pt was encouraged to keep appointment and follow up with [facility] in the am. Pt agreed with keeping appointment. MD aware, plan to discharge pt today.

## 2015-09-23 NOTE — Progress Notes (Signed)
Patient given discharge, follow up, and medication instructions, verbalized understanding, paper scripts in envelope with patient, IV and telemetry removed, family to transport home

## 2015-09-23 NOTE — Progress Notes (Signed)
Inpatient Diabetes Program Recommendations  AACE/ADA: New Consensus Statement on Inpatient Glycemic Control (2015)  Target Ranges:  Prepandial:   less than 140 mg/dL      Peak postprandial:   less than 180 mg/dL (1-2 hours)      Critically ill patients:  140 - 180 mg/dL   Lab Results  Component Value Date   GLUCAP 212* 09/23/2015   HGBA1C 10.3* 08/20/2015  Results for Deborah [name], Deborah [name] (MRN [id number]) as of 09/23/2015 10:41  Ref. Range 09/21/2015 10:36 09/21/2015 15:08 09/21/2015 16:53 09/21/2015 20:42 09/22/2015 07:17 09/22/2015 12:03 09/22/2015 17:58 09/22/2015 21:01 09/23/2015 04:04 09/23/2015 07:28  Glucose-Capillary Latest Ref Range: 65-99 mg/dL 260 (H) 246 (H) 236 (H) 169 (H) 214 (H) 234 (H) 287 (H) 250 (H) 186 (H) 212 (H)    Review of Glycemic Control  Blood sugars remain in 200s. Eating 100%.  Inpatient Diabetes Program Recommendations:    Restart home diabetes meds at reduced rate. D/C Lantus 70/30 40 units in am, 25 units Q lunch and 20 units Q dinner meal. Titrate daily. Add HS correction.  Will follow. Thank you. Lorenda Peck, RD, LDN, CDE Inpatient Diabetes Coordinator [phone]

## 2015-09-23 NOTE — Discharge Summary (Signed)
Physician Discharge Summary  Deborah [name] [id number] DOB: 1965/12/01 DOA: 09/21/2015  PCP: Ernest Haber, MD  Admit date: 09/21/2015 Discharge date: 09/23/2015  Admitted From: home  Disposition:  home   Recommendations for Outpatient Follow-up:  1. F/u A1c    Discharge Condition:  stable   CODE STATUS:  Full code   Diet recommendation:  Diabetic heart healthy Consultations:  none    Discharge Diagnoses:  Principal Problem:   Gastroparesis Active Problems:   Essential hypertension, benign   Uncontrolled type 2 diabetes mellitus with diabetic neuropathy, with long-term current use of insulin (HCC)   Dyslipidemia associated with type 2 diabetes mellitus (HCC)   CKD (chronic kidney disease) stage 3, GFR 30-59 ml/min   Hypokalemia   Hypomagnesemia    Subjective: Tolerating diet today. No abdominal pain.   Brief Summary: 50 year old female with a history of diabetes mellitus, hypertension, CVA, recurrent ER visits for abdominal and chest pain, peptic ulcer disease who underwent a cholecystectomy on June 1. She has been admitted multiple times for vomiting and abdominal pain and comes in for this again. She states this has been an ongoing issue since December 2016. She had an EGD performed in January which showed she had a peptic ulcer. She has been out of her medications for 2 wks now and decided not to return to her PCP as she had no way to pain. She did get an appt with a clinic but is yet to be seen there. Her vomiting has be "so bad" that she had only been managing to drink chicken noodle soup. She is not even able to keep this down now. She was admitted due to hypokalemia and dehydration.   Hospital Course:  Principal Problem:  Nausea and vomiting - I saw her last month for this same issue- at that time, she never mentioned her financial issues and assured me that she would be able to fill prescriptions and thus, case management was not consulted for  assistance -Her GI issues have not resolved with treatment of peptic ulcer disease and a cholecystectomy- I suspected she may have diabetic gastroparesis and initiated reglan for treatment- she did well on this treatment  -Attempted to obtain gastric emptying study however, she was actively vomiting and subsequently on Reglan, therefore, it could not be performed - continue on oral Reglan 4 times a day  - explained the need for small meals and the avoidance of narcotics in the long run- she appears to understand the reasoning - for PUD, cont PPI and Carafate (IV protonix critical shortage- on oral) - will ask case management to assist with medications and PCP f/u   Active Problems:  Hypokalemia/ hypomagnesemia - making progress with this- cont to replace   Uncontrolled type 2 diabetes mellitus with diabetic neuropathy, with long-term current use of insulin  -Continue insulin to control sugars - using Lantus here - she uses 70/30 with she can resume -A1c is 10.3   Dyslipidemia associated with type 2 diabetes mellitus  - statin    Dehydration with CKD (chronic kidney disease) stage 3, GFR 30-59 ml/min - Mild rise in creatinine on admission is improving with hydration  Disposition: has an appointment with the free clinic tomorrow AM where she can fill prescriptions as well- next appointment is for next month and she cannot miss this appt. Will d/c her tonight for her to be followed up in AM.   Discharge Instructions      Discharge Instructions    Discharge instructions  Complete by:  As directed   Diabetic, heart healthy diet- small frequent meals     Increase activity slowly    Complete by:  As directed             Medication List    STOP taking these medications        hydrochlorothiazide 25 MG tablet  Commonly known as:  HYDRODIURIL     metroNIDAZOLE 500 MG tablet  Commonly known as:  FLAGYL     nitrofurantoin (macrocrystal-monohydrate) 100 MG capsule   Commonly known as:  MACROBID     ROXICODONE 5 MG immediate release tablet  Generic drug:  oxyCODONE      TAKE these medications        albuterol 108 (90 Base) MCG/ACT inhaler  Commonly known as:  PROVENTIL HFA;VENTOLIN HFA  Inhale 2 puffs into the lungs every 6 (six) hours as needed for wheezing or shortness of breath.     allopurinol 100 MG tablet  Commonly known as:  ZYLOPRIM  Take 1 tablet (100 mg total) by mouth daily.     amLODipine 10 MG tablet  Commonly known as:  NORVASC  Take 1 tablet (10 mg total) by mouth daily.     dicyclomine 20 MG tablet  Commonly known as:  BENTYL  Take 1 tablet (20 mg total) by mouth every 6 (six) hours as needed. Abdominal cramps     DULoxetine 30 MG capsule  Commonly known as:  CYMBALTA  Take 1 capsule (30 mg total) by mouth 2 (two) times daily.     metoCLOPramide 10 MG tablet  Commonly known as:  REGLAN  Take 1 tablet (10 mg total) by mouth 4 (four) times daily -  before meals and at bedtime. Take 30 min prior to eating     metoprolol succinate 25 MG 24 hr tablet  Commonly known as:  TOPROL-XL  Take 1 tablet (25 mg total) by mouth daily.     NOVOLOG MIX 70/30 (70-30) 100 UNIT/ML injection  Generic drug:  insulin aspart protamine- aspart  Inject 0.5-0.75 mLs (50-75 Units total) into the skin 3 (three) times daily after meals. 75 units every morning, 60 units with lunch, and 50 units with dinner     pantoprazole 40 MG tablet  Commonly known as:  PROTONIX  Take 1 tablet (40 mg total) by mouth 2 (two) times daily.     pravastatin 10 MG tablet  Commonly known as:  PRAVACHOL  Take 1 tablet (10 mg total) by mouth daily.     ranitidine 150 MG tablet  Commonly known as:  ZANTAC  Take 1 tablet (150 mg total) by mouth daily as needed for heartburn.     senna 8.6 MG tablet  Commonly known as:  SENOKOT  Take 1 tablet (8.6 mg total) by mouth 2 (two) times daily.     sucralfate 1 g tablet  Commonly known as:  CARAFATE  Take 1 tablet (1  g total) by mouth 4 (four) times daily.     Vitamin D (Ergocalciferol) 50000 units Caps capsule  Commonly known as:  DRISDOL  Take 1 capsule (50,000 Units total) by mouth every 7 (seven) days.       Follow-up Information    Follow up On 09/24/2015.   Why:  [facility], [address] [place], [place] 09/24/15 at 10 AM.  Please keep appointment.      Allergies  Allergen Reactions  . Lisinopril Anaphylaxis    Tongue  and mouth swelling  . Penicillins Palpitations    Has patient had a PCN reaction causing immediate rash, facial/tongue/throat swelling, SOB or lightheadedness with hypotension: Yes Has patient had a PCN reaction causing severe rash involving mucus membranes or skin necrosis: No Has patient had a PCN reaction that required hospitalization: Yes  Has patient had a PCN reaction occurring within the last 10 years: No   . Valium [Diazepam] [name]  . Acetaminophen Other (See Comments)    Abdominal pain  . Food Hives and Swelling    Carrots, ketchup   . Morphine And Related   . Nsaids Other (See Comments)    ULCER     Procedures/Studies:   Dg Abd 1 View  09/21/2015  CLINICAL DATA:  Abdominal pain, nausea and vomiting EXAM: ABDOMEN - 1 VIEW COMPARISON:  08/21/2015 FINDINGS: Cholecystectomy clips noted The bowel gas pattern is normal. No radio-opaque calculi or other significant radiographic abnormality are seen. IMPRESSION: Negative. Electronically Signed   By: Kerby Moors M.D.   On: 09/21/2015 11:57       Discharge Exam: Filed Vitals:   09/23/15 0406 09/23/15 1358  BP: 149/98 118/79  Pulse: 90 94  Temp: 98.5 F (36.9 C) 98.3 F (36.8 C)  Resp: 20 19   Filed Vitals:   09/22/15 1508 09/22/15 2103 09/23/15 0406 09/23/15 1358  BP: 134/86 129/75 149/98 118/79  Pulse: 94 94 90 94  Temp: 98.2 F (36.8 C) 98.5 F (36.9 C) 98.5 F (36.9 C) 98.3 F (36.8 C)  TempSrc: Oral Oral Oral Oral  Resp:  18  20 19   Height:      Weight:      SpO2: 100% 100% 100% 99%    General: Pt is alert, awake, not in acute distress Cardiovascular: RRR, S1/S2 +, no rubs, no gallops Respiratory: CTA bilaterally, no wheezing, no rhonchi Abdominal: Soft, NT, ND, bowel sounds + Extremities: no edema, no cyanosis    The results of significant diagnostics from this hospitalization (including imaging, microbiology, ancillary and laboratory) are listed below for reference.     Microbiology: No results found for this or any previous visit (from the past 240 hour(s)).   Labs: BNP (last 3 results) No results for input(s): BNP in the last 8760 hours. Basic Metabolic Panel:  Recent Labs Lab 09/21/15 1059 09/21/15 1102 09/22/15 0425 09/23/15 0446  NA 138  --  139 137  K 2.8*  --  3.2* 3.2*  CL 102  --  107 108  CO2 27  --  24 23  GLUCOSE 277*  --  246* 202*  BUN 9  --  8 8  CREATININE 1.43*  --  1.01* 0.78  CALCIUM 8.9  --  8.0* 8.2*  MG  --  1.1* 1.8 1.5*   Liver Function Tests:  Recent Labs Lab 09/21/15 1059  AST 21  ALT 21  ALKPHOS 70  BILITOT 0.6  PROT 8.7*  ALBUMIN 4.2    Recent Labs Lab 09/21/15 1059  LIPASE 43   No results for input(s): AMMONIA in the last 168 hours. CBC:  Recent Labs Lab 09/21/15 1059 09/22/15 0425 09/23/15 0446  WBC 10.6* 9.2 8.6  HGB 11.5* 9.3* 9.5*  HCT 34.9* 28.7* 28.7*  MCV 91.4 91.4 90.5  PLT 412* 325 317   Cardiac Enzymes: No results for input(s): CKTOTAL, CKMB, CKMBINDEX, TROPONINI in the last 168 hours. BNP: Invalid input(s): POCBNP CBG:  Recent Labs Lab 09/22/15 2101 09/23/15 0404 09/23/15 0728 09/23/15 1201 09/23/15  [place]   D-Dimer No results for input(s): DDIMER in the last 72 hours. Hgb A1c No results for input(s): HGBA1C in the last 72 hours. Lipid Profile No results for input(s): CHOL, HDL, LDLCALC, TRIG, CHOLHDL, LDLDIRECT in the last 72 hours. Thyroid function studies No results for  input(s): TSH, T4TOTAL, T3FREE, THYROIDAB in the last 72 hours.  Invalid input(s): FREET3 Anemia work up No results for input(s): VITAMINB12, FOLATE, FERRITIN, TIBC, IRON, RETICCTPCT in the last 72 hours. Urinalysis    Component Value Date/Time   COLORURINE YELLOW 09/21/2015 1654   APPEARANCEUR CLOUDY* 09/21/2015 1654   LABSPEC 1.021 09/21/2015 1654   PHURINE 5.5 09/21/2015 1654   GLUCOSEU 250* 09/21/2015 1654   HGBUR SMALL* 09/21/2015 1654   BILIRUBINUR SMALL* 09/21/2015 1654   KETONESUR 15* 09/21/2015 1654   PROTEINUR 100* 09/21/2015 1654   UROBILINOGEN 0.2 10/02/2013 2108   NITRITE NEGATIVE 09/21/2015 1654   LEUKOCYTESUR NEGATIVE 09/21/2015 1654   Sepsis Labs Invalid input(s): PROCALCITONIN,  WBC,  LACTICIDVEN Microbiology No results found for this or any previous visit (from the past 240 hour(s)).   Time coordinating discharge: Over 30 minutes  SIGNED:   Debbe Odea, MD  [facility] 09/23/2015, 5:21 PM Pager   If 7PM-7AM, please contact night-coverage [email or web address] Password [email or web address]

## 2015-09-26 ENCOUNTER — Emergency Department (HOSPITAL_COMMUNITY): Payer: Medicaid Other

## 2015-09-26 ENCOUNTER — Encounter (HOSPITAL_COMMUNITY): Payer: Self-pay | Admitting: *Deleted

## 2015-09-26 ENCOUNTER — Ambulatory Visit: Payer: Self-pay | Admitting: Family Medicine

## 2015-09-26 ENCOUNTER — Observation Stay (HOSPITAL_COMMUNITY)
Admission: EM | Admit: 2015-09-26 | Discharge: 2015-09-28 | Disposition: A | Discharge status: Home / Self Care | Payer: Medicaid Other | Attending: Internal Medicine | Admitting: Internal Medicine

## 2015-09-26 DIAGNOSIS — E1122 Type 2 diabetes mellitus with diabetic chronic kidney disease: Secondary | ICD-10-CM | POA: Diagnosis not present

## 2015-09-26 DIAGNOSIS — R0789 Other chest pain: Secondary | ICD-10-CM | POA: Diagnosis not present

## 2015-09-26 DIAGNOSIS — I129 Hypertensive chronic kidney disease with stage 1 through stage 4 chronic kidney disease, or unspecified chronic kidney disease: Secondary | ICD-10-CM | POA: Diagnosis not present

## 2015-09-26 DIAGNOSIS — K3184 Gastroparesis: Secondary | ICD-10-CM | POA: Diagnosis not present

## 2015-09-26 DIAGNOSIS — E876 Hypokalemia: Secondary | ICD-10-CM | POA: Diagnosis not present

## 2015-09-26 DIAGNOSIS — G4733 Obstructive sleep apnea (adult) (pediatric): Secondary | ICD-10-CM | POA: Diagnosis not present

## 2015-09-26 DIAGNOSIS — Z9049 Acquired absence of other specified parts of digestive tract: Secondary | ICD-10-CM | POA: Diagnosis not present

## 2015-09-26 DIAGNOSIS — Z794 Long term (current) use of insulin: Secondary | ICD-10-CM | POA: Diagnosis not present

## 2015-09-26 DIAGNOSIS — M797 Fibromyalgia: Secondary | ICD-10-CM | POA: Insufficient documentation

## 2015-09-26 DIAGNOSIS — Z79899 Other long term (current) drug therapy: Secondary | ICD-10-CM | POA: Insufficient documentation

## 2015-09-26 DIAGNOSIS — E785 Hyperlipidemia, unspecified: Secondary | ICD-10-CM | POA: Insufficient documentation

## 2015-09-26 DIAGNOSIS — Z8673 Personal history of transient ischemic attack (TIA), and cerebral infarction without residual deficits: Secondary | ICD-10-CM | POA: Insufficient documentation

## 2015-09-26 DIAGNOSIS — R079 Chest pain, unspecified: Secondary | ICD-10-CM | POA: Diagnosis present

## 2015-09-26 DIAGNOSIS — N183 Chronic kidney disease, stage 3 (moderate): Secondary | ICD-10-CM | POA: Diagnosis not present

## 2015-09-26 DIAGNOSIS — Z6841 Body Mass Index (BMI) 40.0 and over, adult: Secondary | ICD-10-CM | POA: Insufficient documentation

## 2015-09-26 DIAGNOSIS — E1143 Type 2 diabetes mellitus with diabetic autonomic (poly)neuropathy: Secondary | ICD-10-CM | POA: Insufficient documentation

## 2015-09-26 HISTORY — DX: Fibromyalgia: M79.7

## 2015-09-26 LAB — BASIC METABOLIC PANEL
ANION GAP: 14 (ref 5–15)
BUN: 7 mg/dL (ref 6–20)
CALCIUM: 9.3 mg/dL (ref 8.9–10.3)
CO2: 26 mmol/L (ref 22–32)
CREATININE: 0.93 mg/dL (ref 0.44–1.00)
Chloride: 94 mmol/L — ABNORMAL LOW (ref 101–111)
GFR calc Af Amer: >60 mL/min (ref >60)
GLUCOSE: 281 mg/dL — AB (ref 65–99)
Potassium: 2.9 mmol/L — ABNORMAL LOW (ref 3.5–5.1)
Sodium: 134 mmol/L — ABNORMAL LOW (ref 135–145)

## 2015-09-26 LAB — CBC
HCT: 37.7 % (ref 36.0–46.0)
HEMOGLOBIN: 12.3 g/dL (ref 12.0–15.0)
MCH: 29.4 pg (ref 26.0–34.0)
MCHC: 32.6 g/dL (ref 30.0–36.0)
MCV: 90 fL (ref 78.0–100.0)
PLATELETS: 405 10*3/uL — AB (ref 150–400)
RBC: 4.19 MIL/uL (ref 3.87–5.11)
RDW: 13.3 % (ref 11.5–15.5)
WBC: 10.7 10*3/uL — ABNORMAL HIGH (ref 4.0–10.5)

## 2015-09-26 LAB — I-STAT TROPONIN, ED: TROPONIN I, POC: 0.01 ng/mL (ref 0.00–0.08)

## 2015-09-26 LAB — TROPONIN I: Troponin I: <0.03 ng/mL (ref <0.03)

## 2015-09-26 LAB — GLUCOSE, CAPILLARY
GLUCOSE-CAPILLARY: 222 mg/dL — AB (ref 65–99)
GLUCOSE-CAPILLARY: 200 mg/dL — AB (ref 65–99)

## 2015-09-26 LAB — D-DIMER, QUANTITATIVE (NOT AT ARMC): D DIMER QUANT: 0.42 ug{FEU}/mL (ref 0.00–0.50)

## 2015-09-26 MED ORDER — FENTANYL CITRATE (PF) 100 MCG/2ML IJ SOLN
100.0000 ug | Freq: Once | INTRAMUSCULAR | Status: AC
  Administered 2015-09-26: 100 ug via INTRAVENOUS
  Filled 2015-09-26: qty 2

## 2015-09-26 MED ORDER — OXYCODONE HCL 5 MG PO TABS
5.0000 mg | ORAL_TABLET | Freq: Four times a day (QID) | ORAL | Status: DC | PRN
  Administered 2015-09-26: 10 mg via ORAL
  Administered 2015-09-27: 5 mg via ORAL
  Administered 2015-09-27: 10 mg via ORAL
  Administered 2015-09-27: 5 mg via ORAL
  Administered 2015-09-27 – 2015-09-28 (×2): 10 mg via ORAL
  Filled 2015-09-26 (×6): qty 2
  Filled 2015-09-26: qty 1

## 2015-09-26 MED ORDER — ONDANSETRON HCL 4 MG/2ML IJ SOLN
4.0000 mg | Freq: Four times a day (QID) | INTRAMUSCULAR | Status: DC | PRN
  Administered 2015-09-26 – 2015-09-28 (×5): 4 mg via INTRAVENOUS
  Filled 2015-09-26 (×5): qty 2

## 2015-09-26 MED ORDER — POTASSIUM CHLORIDE IN NACL 20-0.9 MEQ/L-% IV SOLN
INTRAVENOUS | Status: DC
  Administered 2015-09-26 – 2015-09-27 (×3): via INTRAVENOUS
  Filled 2015-09-26 (×4): qty 1000

## 2015-09-26 MED ORDER — NITROGLYCERIN 0.4 MG SL SUBL
0.4000 mg | SUBLINGUAL_TABLET | SUBLINGUAL | Status: DC | PRN
Start: 2015-09-26 — End: 2015-09-26
  Administered 2015-09-26: 0.4 mg via SUBLINGUAL
  Filled 2015-09-26: qty 1

## 2015-09-26 MED ORDER — SODIUM CHLORIDE 0.9 % IV BOLUS (SEPSIS)
1000.0000 mL | Freq: Once | INTRAVENOUS | Status: AC
  Administered 2015-09-26: 1000 mL via INTRAVENOUS

## 2015-09-26 MED ORDER — METOCLOPRAMIDE HCL 10 MG PO TABS
10.0000 mg | ORAL_TABLET | Freq: Four times a day (QID) | ORAL | Status: DC | PRN
  Administered 2015-09-27 – 2015-09-28 (×2): 10 mg via ORAL
  Filled 2015-09-26 (×2): qty 1

## 2015-09-26 MED ORDER — HEPARIN SODIUM (PORCINE) 5000 UNIT/ML IJ SOLN
5000.0000 [IU] | Freq: Three times a day (TID) | INTRAMUSCULAR | Status: DC
  Administered 2015-09-26 – 2015-09-28 (×5): 5000 [IU] via SUBCUTANEOUS
  Filled 2015-09-26 (×5): qty 1

## 2015-09-26 MED ORDER — POTASSIUM CHLORIDE CRYS ER 20 MEQ PO TBCR
40.0000 meq | EXTENDED_RELEASE_TABLET | Freq: Once | ORAL | Status: AC
  Administered 2015-09-26: 40 meq via ORAL
  Filled 2015-09-26: qty 2

## 2015-09-26 MED ORDER — SODIUM CHLORIDE 0.9 % IV SOLN: INTRAVENOUS | Status: DC

## 2015-09-26 MED ORDER — MAGNESIUM SULFATE 2 GM/50ML IV SOLN
2.0000 g | Freq: Once | INTRAVENOUS | Status: AC
  Administered 2015-09-26: 2 g via INTRAVENOUS
  Filled 2015-09-26: qty 50

## 2015-09-26 MED ORDER — POTASSIUM CHLORIDE 10 MEQ/100ML IV SOLN
10.0000 meq | Freq: Once | INTRAVENOUS | Status: AC
  Administered 2015-09-26: 10 meq via INTRAVENOUS
  Filled 2015-09-26: qty 100

## 2015-09-26 MED ORDER — METOCLOPRAMIDE HCL 5 MG/ML IJ SOLN
10.0000 mg | Freq: Once | INTRAMUSCULAR | Status: AC
  Administered 2015-09-26: 10 mg via INTRAVENOUS
  Filled 2015-09-26: qty 2

## 2015-09-26 MED ORDER — ACETAMINOPHEN 325 MG PO TABS: 650.0000 mg | ORAL_TABLET | ORAL | Status: DC | PRN

## 2015-09-26 MED ORDER — ASPIRIN 325 MG PO TABS
325.0000 mg | ORAL_TABLET | Freq: Once | ORAL | Status: DC
  Filled 2015-09-26: qty 1

## 2015-09-26 MED ORDER — SUCRALFATE 1 G PO TABS
1.0000 g | ORAL_TABLET | Freq: Three times a day (TID) | ORAL | Status: DC
  Filled 2015-09-26 (×5): qty 1

## 2015-09-26 MED ORDER — POTASSIUM CHLORIDE CRYS ER 20 MEQ PO TBCR
40.0000 meq | EXTENDED_RELEASE_TABLET | Freq: Four times a day (QID) | ORAL | Status: AC
  Administered 2015-09-26 (×2): 40 meq via ORAL
  Filled 2015-09-26 (×2): qty 2

## 2015-09-26 MED ORDER — INSULIN ASPART 100 UNIT/ML ~~LOC~~ SOLN
0.0000 [IU] | Freq: Three times a day (TID) | SUBCUTANEOUS | Status: DC
  Administered 2015-09-26 – 2015-09-27 (×2): 5 [IU] via SUBCUTANEOUS
  Administered 2015-09-27 (×2): 8 [IU] via SUBCUTANEOUS
  Administered 2015-09-28: 11 [IU] via SUBCUTANEOUS

## 2015-09-26 NOTE — ED Notes (Signed)
MD at bedside. Hospitalist at bedside. 

## 2015-09-26 NOTE — ED Notes (Signed)
MD at bedside. 

## 2015-09-26 NOTE — ED Notes (Signed)
Patient speaking on cell phone with mother who is hospitalized. Patient becoming tearful. RN offered therapeutic support.

## 2015-09-26 NOTE — ED Notes (Signed)
PT CAN GO TO FLOOR AT 15:52

## 2015-09-26 NOTE — ED Notes (Signed)
Patient received first nitroglycerin tablet. Patient states she wants to wait on taking any more tablets to see if the first one will work. Patient made aware she can receive up to 3 tablets.

## 2015-09-26 NOTE — ED Notes (Addendum)
[name] and Chest pain started at 4 am woke pt out of sleep, left sided, started to vomit, now has left arm pain and crushing chest pain, sweating, nausea, abd pain

## 2015-09-26 NOTE — H&P (Signed)
History and Physical    Deborah [name] [id number] DOB: [birth year] DOA: 09/26/2015  PCP: Ernest Haber, MD  Patient coming from: Home  Chief Complaint: Chest pain  HPI: Deborah [name] is a 50 y.o. female with medical history significant of diabetes. 2 with gastroparesis, hypertension and dyslipidemia. Patient given to the hospital complaining about chest pain. Chest pain started this morning at 4 AM, substernal, sharp, radiates to left side, patient did not remember anything improve or worsens the pain. Came in to the hospital for further evaluation. Initial evaluation in the ED showed negative troponin and EKG for ischemic changes.  ED Course: Vitals: Stable Labs: Potassium of 2.9. Negative troponin Imaging: CXR without acute findings. Interventions: Given 1 nitroglycerin in the ED.  Review of Systems: As per HPI otherwise 10 point review of systems negative.   Past Medical History  Diagnosis Date  . Stroke ([place])   . Hypertension   . Fibromyalgia     Past Surgical History  Procedure Laterality Date  . Cholecystectomy       reports that she has never smoked. She has never used smokeless tobacco. She reports that she does not drink alcohol or use illicit drugs.  Allergies  Allergen Reactions  . Aspirin Other (See Comments)    Irritates stomach ulcer   . Lisinopril Swelling    Mouth   . Tylenol [Acetaminophen] Other (See Comments)    Irritates stomach ulcer   . Morphine And Related Palpitations  . Penicillins Palpitations    Has patient had a PCN reaction causing immediate rash, facial/tongue/throat swelling, SOB or lightheadedness with hypotension: no-- heart racing  Has patient had a PCN reaction causing severe rash involving mucus membranes or skin necrosis: no Has patient had a PCN reaction that required hospitalization: no Has patient had a PCN reaction occurring within the last 10 years: no If all of the above answers are "NO", then may proceed with  Cephalosporin use.    Family history: Diabetes pretty much in everybody in her family including her mother, father and 2 siblings.  Prior to Admission medications   Medication Sig Start Date End Date Taking? Authorizing Provider  allopurinol (ZYLOPRIM) 100 MG tablet Take 100 mg by mouth daily. 07/16/15  Yes Historical Provider, MD  dicyclomine (BENTYL) 20 MG tablet Take 20 mg by mouth every 6 (six) hours as needed for spasms.  09/16/15  Yes Historical Provider, MD  HYDROcodone-acetaminophen (NORCO/VICODIN) 5-325 MG tablet Take 1 tablet by mouth every 6 (six) hours as needed for moderate pain or severe pain.  07/16/15  Yes Historical Provider, MD  metoCLOPramide (REGLAN) 10 MG tablet Take 10 mg by mouth every 6 (six) hours as needed for nausea or vomiting.  09/16/15  Yes Historical Provider, MD  Oxycodone HCl 10 MG TABS Take 10 mg by mouth every 8 (eight) hours as needed (severe pain).  09/16/15  Yes Historical Provider, MD  sucralfate (CARAFATE) 1 g tablet Take 1 g by mouth 4 (four) times daily. 07/16/15  Yes Historical Provider, MD    Physical Exam: Filed Vitals:   09/26/15 1430 09/26/15 1500 09/26/15 1530 09/26/15 1627  BP: 150/86 128/69 127/83 153/86  Pulse: 104 98 98 90  Temp:    98.2 F (36.8 C)  TempSrc:    Oral  Resp: 19 18 16 18   Height:    5\' 6"  (1.676 m)  Weight:    141.522 kg (312 lb)  SpO2: 100% 96% 96% 100%      Constitutional: NAD, calm, comfortable Filed  Vitals:   09/26/15 1430 09/26/15 1500 09/26/15 1530 09/26/15 1627  BP: 150/86 128/69 127/83 153/86  Pulse: 104 98 98 90  Temp:    98.2 F (36.8 C)  TempSrc:    Oral  Resp: 19 18 16 18   Height:    5\' 6"  (1.676 m)  Weight:    141.522 kg (312 lb)  SpO2: 100% 96% 96% 100%   Eyes: PERRL, lids and conjunctivae normal ENMT: Mucous membranes are moist. Posterior pharynx clear of any exudate or lesions.Normal dentition.  Neck: normal, supple, no masses, no thyromegaly Respiratory: clear to auscultation bilaterally, no  wheezing, no crackles. Normal respiratory effort. No accessory muscle use.  Cardiovascular: Regular rate and rhythm, no murmurs / rubs / gallops. No extremity edema. 2+ pedal pulses. No carotid bruits.  Abdomen: no tenderness, no masses palpated. No hepatosplenomegaly. Bowel sounds positive.  Musculoskeletal: no clubbing / cyanosis. No joint deformity upper and lower extremities. Good ROM, no contractures. Normal muscle tone.  Skin: no rashes, lesions, ulcers. No induration Neurologic: CN 2-12 grossly intact. Sensation intact, DTR normal. Strength 5/5 in all 4.  Psychiatric: Normal judgment and insight. Alert and oriented x 3. Normal mood.   Labs on Admission: I have personally reviewed following labs and imaging studies  CBC:  Recent Labs Lab 09/26/15 1202  WBC 10.7*  HGB 12.3  HCT 37.7  MCV 90.0  PLT 123456*   Basic Metabolic Panel:  Recent Labs Lab 09/26/15 1202  NA 134*  K 2.9*  CL 94*  CO2 26  GLUCOSE 281*  BUN 7  CREATININE 0.93  CALCIUM 9.3   GFR: Estimated Creatinine Clearance: 104.2 mL/min (by C-G formula based on Cr of 0.93). Liver Function Tests: No results for input(s): AST, ALT, ALKPHOS, BILITOT, PROT, ALBUMIN in the last 168 hours. No results for input(s): LIPASE, AMYLASE in the last 168 hours. No results for input(s): AMMONIA in the last 168 hours. Coagulation Profile: No results for input(s): INR, PROTIME in the last 168 hours. Cardiac Enzymes:  Recent Labs Lab 09/26/15 1202  TROPONINI <0.03   BNP (last 3 results) No results for input(s): PROBNP in the last 8760 hours. HbA1C: No results for input(s): HGBA1C in the last 72 hours. CBG: No results for input(s): GLUCAP in the last 168 hours. Lipid Profile: No results for input(s): CHOL, HDL, LDLCALC, TRIG, CHOLHDL, LDLDIRECT in the last 72 hours. Thyroid Function Tests: No results for input(s): TSH, T4TOTAL, FREET4, T3FREE, THYROIDAB in the last 72 hours. Anemia Panel: No results for input(s):  VITAMINB12, FOLATE, FERRITIN, TIBC, IRON, RETICCTPCT in the last 72 hours. Urine analysis: No results found for: COLORURINE, APPEARANCEUR, LABSPEC, PHURINE, GLUCOSEU, HGBUR, BILIRUBINUR, KETONESUR, PROTEINUR, UROBILINOGEN, NITRITE, LEUKOCYTESUR Sepsis Labs: !!!!!!!!!!!!!!!!!!!!!!!!!!!!!!!!!!!!!!!!!!!! @LABRCNTIP (procalcitonin:4,lacticidven:4) )No results found for this or any previous visit (from the past 240 hour(s)).   Radiological Exams on Admission: Dg Chest 2 View  09/26/2015  CLINICAL DATA:  Chest and left arm pain, nausea/vomiting EXAM: CHEST  2 VIEW COMPARISON:  None. FINDINGS: Lungs are clear.  No pleural effusion or pneumothorax. The heart is normal in size. Visualized osseous structures are within normal limits. IMPRESSION: No evidence of acute cardiopulmonary disease. Electronically Signed   By: Julian Hy M.D.   On: 09/26/2015 13:08    EKG: Independently reviewed. Without ischemic changes heart rate 101.  Assessment/plan Principal Problem:   Chest pain Active Problems:   Hypokalemia   DM (diabetes mellitus) type 2, uncontrolled, with ketoacidosis (HCC)   Hypomagnesemia   Gastroparesis   Chest pain -  Atypical chest pain, substernal and relieved by nitroglycerin but not typical character. -Initial evaluation in the ED showed negative troponin and 12-lead EKG. -We'll cycle the cardiac enzymes and repeat EKG in the morning. Check d-dimer's. -If all negative fall probably discharge home and instructed to follow-up with PCP to consider stress test as outpatient.  Diabetes mellitus type 2 -Uncontrolled diabetes mellitus type 2 with gastroparesis. -Started on carbohydrate modified diet, restarted her insulin and held oral hypoglycemic agents. SSI.  Hypokalemia -Potassium is 2.9, last time this is was accompanied by hypomagnesemia. -Replete both potassium and magnesium and check both in a.m.  Gastroparesis -Has history of severe gastroparesis, on Reglan,  continued. -Reported she vomited 6 times this morning.   DVT prophylaxis:  subcutaneous heparin Code Status: full code  Family Communication: Plan discussed with the patient in the presence of her husband at bedside  Disposition Plan: home,  likely in a.m. if troponins negative  Consults called: None Admission status: Observation, telemetry   [facility] A MD [facility] Pager [phone]   If 7PM-7AM, please contact night-coverage [email or web address] Password [facility]  09/26/2015, 4:41 PM

## 2015-09-26 NOTE — ED Notes (Signed)
Notify nurse of ready bad and instructed to wait to call 20 min timer,

## 2015-09-26 NOTE — ED Notes (Signed)
Patient transported to X-ray 

## 2015-09-26 NOTE — ED Provider Notes (Addendum)
[name]: [id number]     Arrival date & time 09/26/15  1145 History   First MD Initiated Contact with Patient 09/26/15 1156     Chief Complaint  Patient presents with  . Chest Pain     (Consider location/radiation/quality/duration/timing/severity/associated sxs/prior Treatment) Patient is a 50 y.o. female presenting with chest pain.  Chest Pain Pain location:  L chest and substernal area Pain quality: aching and crushing   Pain radiates to:  L jaw and L arm Pain radiates to the back: no   Pain severity:  Mild Onset quality:  Sudden Duration:  8 hours Timing:  Constant Progression:  Worsening Chronicity:  New Relieved by:  None tried Worsened by:  Nothing tried Ineffective treatments:  None tried Associated symptoms: diaphoresis, nausea, shortness of breath and vomiting   Associated symptoms: no abdominal pain and no palpitations     Past Medical History  Diagnosis Date  . Stroke ([place])   . Hypertension   . Fibromyalgia    Past Surgical History  Procedure Laterality Date  . Cholecystectomy     No family history on file. Social History  Substance Use Topics  . Smoking status: Never Smoker   . Smokeless tobacco: None  . Alcohol Use: No   OB History    No data available     Review of Systems  Constitutional: Positive for diaphoresis.  HENT: Negative for congestion.   Eyes: Negative for pain and itching.  Respiratory: Positive for chest tightness and shortness of breath.   Cardiovascular: Positive for chest pain. Negative for palpitations and leg swelling.  Gastrointestinal: Positive for nausea and vomiting. Negative for abdominal pain.  Endocrine: Negative for polyuria.  All other systems reviewed and are negative.     Allergies  Aspirin; Lisinopril; Tylenol; Morphine and related; and Penicillins  Home Medications   Prior to Admission medications   Medication Sig Start Date End Date Taking? Authorizing Provider  allopurinol (ZYLOPRIM) 100 MG tablet Take 100  mg by mouth daily. 07/16/15  Yes Historical Provider, MD  dicyclomine (BENTYL) 20 MG tablet Take 20 mg by mouth every 6 (six) hours as needed for spasms.  09/16/15  Yes Historical Provider, MD  HYDROcodone-acetaminophen (NORCO/VICODIN) 5-325 MG tablet Take 1 tablet by mouth every 6 (six) hours as needed for moderate pain or severe pain.  07/16/15  Yes Historical Provider, MD  metoCLOPramide (REGLAN) 10 MG tablet Take 10 mg by mouth every 6 (six) hours as needed for nausea or vomiting.  09/16/15  Yes Historical Provider, MD  Oxycodone HCl 10 MG TABS Take 10 mg by mouth every 8 (eight) hours as needed (severe pain).  09/16/15  Yes Historical Provider, MD  sucralfate (CARAFATE) 1 g tablet Take 1 g by mouth 4 (four) times daily. 07/16/15  Yes Historical Provider, MD   BP 90/67 mmHg  Pulse 112  Temp(Src) 98.2 F (36.8 C) (Oral)  Resp 18  Ht 5\' 6"  (1.676 m)  Wt 312 lb (141.522 kg)  BMI 50.38 kg/m2  SpO2 97% Physical Exam  Constitutional: She is oriented to person, place, and time. She appears well-developed and well-nourished.  HENT:  Head: Normocephalic and atraumatic.  Neck: Normal range of motion.  Cardiovascular: Normal rate and regular rhythm.   Pulmonary/Chest: Effort normal. No stridor. No respiratory distress. She has no wheezes.  Abdominal: Soft. She exhibits no distension. There is no tenderness.  Musculoskeletal: Normal range of motion. She exhibits no edema or tenderness.  Neurological: She is alert and oriented to person, place,  and time.  Skin: Skin is warm and dry.  Nursing note and vitals reviewed.   ED Course  Procedures (including critical care time) Labs Review Labs Reviewed  BASIC METABOLIC PANEL - Abnormal; Notable for the following:    Sodium 134 (*)    Potassium 2.9 (*)    Chloride 94 (*)    Glucose, Bld 281 (*)    All other components within normal limits  CBC - Abnormal; Notable for the following:    WBC 10.7 (*)    Platelets 405 (*)    All other components  within normal limits  TROPONIN I  I-STAT TROPOININ, ED    Imaging Review Dg Chest 2 View  09/26/2015  CLINICAL DATA:  Chest and left arm pain, nausea/vomiting EXAM: CHEST  2 VIEW COMPARISON:  None. FINDINGS: Lungs are clear.  No pleural effusion or pneumothorax. The heart is normal in size. Visualized osseous structures are within normal limits. IMPRESSION: No evidence of acute cardiopulmonary disease. Electronically Signed   By: Julian Hy M.D.   On: 09/26/2015 13:08   I have personally reviewed and evaluated these images and lab results as part of my medical decision-making.   EKG Interpretation   Date/Time:  Friday September 26 2015 11:55:54 EDT Ventricular Rate:  121 PR Interval:    QRS Duration: 85 QT Interval:  329 QTC Calculation: 467 R Axis:   -26 Text Interpretation:  Sinus tachycardia Consider right atrial enlargement  Borderline left axis deviation No old tracing to compare Confirmed by  [facility] MD, Corene Cornea [phone]) on 09/26/2015 12:04:01 PM Also confirmed by [facility]  MD, Corene Cornea [phone]), editor Rolla Plate, Joelene Millin [phone])  on 09/26/2015 12:23:21  PM      MDM   Final diagnoses:  Other chest pain  Hypokalemia  Gastroparesis    50 year old female with acute onset chest pressure described as tightness renolithiasis associated with vomiting and diaphoresis for close morning. Did not improve so came in the emergency department for further evaluation. EKG with tachycardia but no elevation, repeat ecg without e/o evolving changes either. First troponin negative. Symptoms improved quite a bit with nitroglycerin in the basically resolved with fentanyl. She has a history of obesity, diabetes, hypertension and hyperlipidemia so this concern for ACS so admitted for ACS rule out. She also has a history of gastroparesis and this could be related to that however this doesn't feel like her similar episodes in the past.    Merrily Pew, MD 09/26/15 [place], MD 09/26/15 1744

## 2015-09-27 DIAGNOSIS — E876 Hypokalemia: Secondary | ICD-10-CM | POA: Diagnosis not present

## 2015-09-27 DIAGNOSIS — R0789 Other chest pain: Secondary | ICD-10-CM | POA: Diagnosis not present

## 2015-09-27 DIAGNOSIS — K3184 Gastroparesis: Secondary | ICD-10-CM | POA: Diagnosis not present

## 2015-09-27 LAB — BASIC METABOLIC PANEL
Anion gap: 9 (ref 5–15)
Anion gap: 9 (ref 5–15)
BUN: 10 mg/dL (ref 6–20)
BUN: 10 mg/dL (ref 6–20)
CALCIUM: 8.6 mg/dL — AB (ref 8.9–10.3)
CHLORIDE: 101 mmol/L (ref 101–111)
CO2: 27 mmol/L (ref 22–32)
CO2: 26 mmol/L (ref 22–32)
CREATININE: 0.99 mg/dL (ref 0.44–1.00)
Calcium: 8.7 mg/dL — ABNORMAL LOW (ref 8.9–10.3)
Chloride: 99 mmol/L — ABNORMAL LOW (ref 101–111)
Creatinine, Ser: 1.17 mg/dL — ABNORMAL HIGH (ref 0.44–1.00)
GFR calc Af Amer: >60 mL/min (ref >60)
GFR calc non Af Amer: >60 mL/min (ref >60)
GFR calc non Af Amer: 53 mL/min — ABNORMAL LOW (ref >60)
GLUCOSE: 265 mg/dL — AB (ref 65–99)
Glucose, Bld: 297 mg/dL — ABNORMAL HIGH (ref 65–99)
POTASSIUM: 3.6 mmol/L (ref 3.5–5.1)
Potassium: 3.1 mmol/L — ABNORMAL LOW (ref 3.5–5.1)
Sodium: 135 mmol/L (ref 135–145)
Sodium: 136 mmol/L (ref 135–145)

## 2015-09-27 LAB — MAGNESIUM
Magnesium: 1.4 mg/dL — ABNORMAL LOW (ref 1.7–2.4)
Magnesium: 2.5 mg/dL — ABNORMAL HIGH (ref 1.7–2.4)

## 2015-09-27 LAB — GLUCOSE, CAPILLARY
GLUCOSE-CAPILLARY: 300 mg/dL — AB (ref 65–99)
GLUCOSE-CAPILLARY: 292 mg/dL — AB (ref 65–99)
Glucose-Capillary: 250 mg/dL — ABNORMAL HIGH (ref 65–99)
Glucose-Capillary: 247 mg/dL — ABNORMAL HIGH (ref 65–99)

## 2015-09-27 MED ORDER — INSULIN ASPART PROT & ASPART (70-30 MIX) 100 UNIT/ML ~~LOC~~ SUSP
20.0000 [IU] | Freq: Three times a day (TID) | SUBCUTANEOUS | Status: DC
Start: 2015-09-27 — End: 2015-09-28
  Administered 2015-09-28: 20 [IU] via SUBCUTANEOUS
  Filled 2015-09-27 (×2): qty 10

## 2015-09-27 MED ORDER — METOPROLOL SUCCINATE ER 25 MG PO TB24
25.0000 mg | ORAL_TABLET | Freq: Every day | ORAL | Status: DC
Start: 2015-09-27 — End: 2015-09-28
  Administered 2015-09-27 – 2015-09-28 (×2): 25 mg via ORAL
  Filled 2015-09-27 (×2): qty 1

## 2015-09-27 MED ORDER — POTASSIUM CHLORIDE CRYS ER 20 MEQ PO TBCR: 60.0000 meq | EXTENDED_RELEASE_TABLET | ORAL | Status: DC

## 2015-09-27 MED ORDER — MAGNESIUM SULFATE 4 GM/100ML IV SOLN
4.0000 g | Freq: Once | INTRAVENOUS | Status: AC
  Administered 2015-09-27: 4 g via INTRAVENOUS
  Filled 2015-09-27: qty 100

## 2015-09-27 MED ORDER — POTASSIUM CHLORIDE CRYS ER 20 MEQ PO TBCR
60.0000 meq | EXTENDED_RELEASE_TABLET | ORAL | Status: AC
  Administered 2015-09-27 (×2): 60 meq via ORAL
  Filled 2015-09-27 (×2): qty 3

## 2015-09-27 MED ORDER — POTASSIUM CHLORIDE CRYS ER 20 MEQ PO TBCR: 60.0000 meq | EXTENDED_RELEASE_TABLET | Freq: Four times a day (QID) | ORAL | Status: DC

## 2015-09-27 MED ORDER — AMLODIPINE BESYLATE 10 MG PO TABS
10.0000 mg | ORAL_TABLET | Freq: Every day | ORAL | Status: DC
  Administered 2015-09-28: 10 mg via ORAL
  Filled 2015-09-27: qty 1

## 2015-09-27 NOTE — Progress Notes (Signed)
PROGRESS NOTE  Deborah [name]  [id number] DOB: Aug 03, 1965 DOA: 09/26/2015 PCP: Ernest Haber, MD Outpatient Specialists:  Subjective: Denies any chest pain today complains about lower abdominal pain. Reported this is acute on chronic.  Brief Narrative:  Chest pain rule out  Assessment & Plan:   Principal Problem:   Chest pain Active Problems:   Hypokalemia   DM (diabetes mellitus), type 2, uncontrolled (HCC)   Hypomagnesemia   Gastroparesis   Chest pain -Atypical chest pain, substernal and relieved by nitroglycerin but not typical character. -Initial evaluation in the ED showed negative troponin and 12-lead EKG. -3 sets of cardiac enzymes negative, d-dimer is negative. -This is resolved, likely could be reflux, patient has chronic abdominal issues.  Acute on chronic abdominal pain -Patient be evaluated previously multiple times with CT scan recently she had 2 CT scans in June without acute findings. -Needs follow-up with GI as outpatient. -Patient requested Dilaudid to control her pain, initially she was scheduled to be discharged after the BMP. -Stated that she is having so severe pain and if she got discharged home she will come back to the ED later today.  Diabetes mellitus type 2 -Uncontrolled diabetes mellitus type 2 with gastroparesis. -Started on carbohydrate modified diet, restarted her insulin and held oral hypoglycemic agents. SSI.  Hypokalemia -Potassium is 2.9, last time this is was accompanied by hypomagnesemia. -Repleted, potassium is 3.6 and magnesium is 2.5.  Gastroparesis -Has history of severe gastroparesis, on Reglan, continued. -Reported she vomited 6 times at home, no vomiting so far in the hospital.   DVT prophylaxis:  Code Status: Full Code Family Communication:  Disposition Plan:  Diet: Diet Carb Modified Fluid consistency:: Thin; Room service appropriate?: Yes Diet - low sodium heart healthy  Consultants:    None*  Procedures:   None  Antimicrobials:   None  Objective: Filed Vitals:   09/27/15 0600 09/27/15 1020 09/27/15 1353 09/27/15 1545  BP: 121/55 137/82 99/59 153/90  Pulse: 92 97 106 95  Temp: 98.5 F (36.9 C) 98.2 F (36.8 C) 98.9 F (37.2 C)   TempSrc: Oral Oral Oral   Resp: 18 18 20    Height:      Weight:      SpO2: 99% 100% 100% 100%    Intake/Output Summary (Last 24 hours) at 09/27/15 1743 Last data filed at 09/27/15 1258  Gross per 24 hour  Intake 2586.25 ml  Output      0 ml  Net 2586.25 ml   Filed Weights   09/26/15 1147 09/26/15 1627  Weight: 141.522 kg (312 lb) 141.522 kg (312 lb)    Examination: General exam: Appears calm and comfortable  Respiratory system: Clear to auscultation. Respiratory effort normal. Cardiovascular system: S1 & S2 heard, RRR. No JVD, murmurs, rubs, gallops or clicks. No pedal edema. Gastrointestinal system: Abdomen is nondistended, soft and nontender. No organomegaly or masses felt. Normal bowel sounds heard. Central nervous system: Alert and oriented. No focal neurological deficits. Extremities: Symmetric 5 x 5 power. Skin: No rashes, lesions or ulcers Psychiatry: Judgement and insight appear normal. Mood & affect appropriate.   Data Reviewed: I have personally reviewed following labs and imaging studies  CBC:  Recent Labs Lab 09/26/15 1202  WBC 10.7*  HGB 12.3  HCT 37.7  MCV 90.0  PLT 123456*   Basic Metabolic Panel:  Recent Labs Lab 09/26/15 1202 09/27/15 0808 09/27/15 1455  NA 134* 135 136  K 2.9* 3.1* 3.6  CL 94* 99* 101  CO2 26 27 26  GLUCOSE 281* 297* 265*  BUN 7 10 10   CREATININE 0.93 0.99 1.17*  CALCIUM 9.3 8.6* 8.7*  MG  --  1.4* 2.5*   GFR: Estimated Creatinine Clearance: 82.8 mL/min (by C-G formula based on Cr of 1.17). Liver Function Tests: No results for input(s): AST, ALT, ALKPHOS, BILITOT, PROT, ALBUMIN in the last 168 hours. No results for input(s): LIPASE, AMYLASE in the last 168  hours. No results for input(s): AMMONIA in the last 168 hours. Coagulation Profile: No results for input(s): INR, PROTIME in the last 168 hours. Cardiac Enzymes:  Recent Labs Lab 09/26/15 1202 09/26/15 1652 09/26/15 2255  TROPONINI <0.03 <0.03 <0.03   BNP (last 3 results) No results for input(s): PROBNP in the last 8760 hours. HbA1C: No results for input(s): HGBA1C in the last 72 hours. CBG:  Recent Labs Lab 09/26/15 1629 09/26/15 2146 09/27/15 0747 09/27/15 1148 09/27/15 1646  GLUCAP 222* 200* 300* 250* 292*   Lipid Profile: No results for input(s): CHOL, HDL, LDLCALC, TRIG, CHOLHDL, LDLDIRECT in the last 72 hours. Thyroid Function Tests: No results for input(s): TSH, T4TOTAL, FREET4, T3FREE, THYROIDAB in the last 72 hours. Anemia Panel: No results for input(s): VITAMINB12, FOLATE, FERRITIN, TIBC, IRON, RETICCTPCT in the last 72 hours. Urine analysis: No results found for: COLORURINE, APPEARANCEUR, LABSPEC, PHURINE, GLUCOSEU, HGBUR, BILIRUBINUR, KETONESUR, PROTEINUR, UROBILINOGEN, NITRITE, LEUKOCYTESUR Sepsis Labs: @LABRCNTIP (procalcitonin:4,lacticidven:4)  )No results found for this or any previous visit (from the past 240 hour(s)).   Invalid input(s): PROCALCITONIN, LACTICACIDVEN   Radiology Studies: Dg Chest 2 View  09/26/2015  CLINICAL DATA:  Chest and left arm pain, nausea/vomiting EXAM: CHEST  2 VIEW COMPARISON:  None. FINDINGS: Lungs are clear.  No pleural effusion or pneumothorax. The heart is normal in size. Visualized osseous structures are within normal limits. IMPRESSION: No evidence of acute cardiopulmonary disease. Electronically Signed   By: Julian Hy M.D.   On: 09/26/2015 13:08        Scheduled Meds: . heparin  5,000 Units Subcutaneous Q8H  . insulin aspart  0-15 Units Subcutaneous TID WC   Continuous Infusions: . 0.9 % NaCl with KCl 20 mEq / L Stopped (09/27/15 1546)        Time spent: 35 minutes    Tyliah Schlereth A,  MD [facility] Pager [phone]  If 7PM-7AM, please contact night-coverage [email or web address] Password [facility] 09/27/2015, 5:43 PM

## 2015-09-28 DIAGNOSIS — E1122 Type 2 diabetes mellitus with diabetic chronic kidney disease: Secondary | ICD-10-CM

## 2015-09-28 DIAGNOSIS — N182 Chronic kidney disease, stage 2 (mild): Secondary | ICD-10-CM

## 2015-09-28 DIAGNOSIS — Z794 Long term (current) use of insulin: Secondary | ICD-10-CM

## 2015-09-28 DIAGNOSIS — E1165 Type 2 diabetes mellitus with hyperglycemia: Secondary | ICD-10-CM

## 2015-09-28 LAB — BASIC METABOLIC PANEL
Anion gap: 6 (ref 5–15)
BUN: 13 mg/dL (ref 6–20)
CO2: 25 mmol/L (ref 22–32)
CREATININE: 1.2 mg/dL — AB (ref 0.44–1.00)
Calcium: 8.6 mg/dL — ABNORMAL LOW (ref 8.9–10.3)
Chloride: 105 mmol/L (ref 101–111)
GFR calc Af Amer: 60 mL/min — ABNORMAL LOW (ref >60)
GFR, EST NON AFRICAN AMERICAN: 51 mL/min — AB (ref >60)
Glucose, Bld: 274 mg/dL — ABNORMAL HIGH (ref 65–99)
Potassium: 3.9 mmol/L (ref 3.5–5.1)
SODIUM: 136 mmol/L (ref 135–145)

## 2015-09-28 LAB — CBC
HCT: 30.4 % — ABNORMAL LOW (ref 36.0–46.0)
Hemoglobin: 9.5 g/dL — ABNORMAL LOW (ref 12.0–15.0)
MCH: 29.1 pg (ref 26.0–34.0)
MCHC: 31.3 g/dL (ref 30.0–36.0)
MCV: 93.3 fL (ref 78.0–100.0)
PLATELETS: 306 10*3/uL (ref 150–400)
RBC: 3.26 MIL/uL — ABNORMAL LOW (ref 3.87–5.11)
RDW: 13.7 % (ref 11.5–15.5)
WBC: 9.4 10*3/uL (ref 4.0–10.5)

## 2015-09-28 LAB — GLUCOSE, CAPILLARY: GLUCOSE-CAPILLARY: 327 mg/dL — AB (ref 65–99)

## 2015-09-28 NOTE — Discharge Summary (Addendum)
Physician Discharge Summary  Deborah [name] [id number] DOB: 1966-02-15 DOA: 09/26/2015  PCP: Nolon Bussing, PA  Admit date: 09/26/2015 Discharge date: 09/28/2015  Admitted From: Home Disposition:  Home  Recommendations for Outpatient Follow-up:  1. Follow up with PCP in 1-2 weeks 2. Please obtain BMP/CBC in one week. 3. Please add ARB as outpatient whichever the clinic can provide.  Home Health: None Equipment/Devices: N/A  Discharge Condition: Stable CODE STATUS: Full code Diet recommendation: Carb modified diet  Brief/Interim Summary: Deborah [name] is a 50 y.o. female with medical history significant of DM2 with chronic nausea and abdominal pain presumed to be secondary to gastroparesis, hypertension and dyslipidemia. Patient came in to the hospital complaining about chest pain. Chest pain started 4 AM the morning of admission, substernal, sharp, radiates to left side, patient did not remember anything improve or worsens the pain. Came in to the hospital for further evaluation. Initial evaluation in the ED showed negative troponin and EKG for ischemic changes.  Discharge Diagnoses:  Principal Problem:   Chest pain Active Problems:   Hypokalemia   DM (diabetes mellitus), type 2, uncontrolled (HCC)   Hypomagnesemia   Gastroparesis   Chest pain -Atypical chest pain, substernal and relieved by nitroglycerin but not typical character. -Initial evaluation in the ED showed negative troponin and 12-lead EKG. -3 sets of cardiac enzymes and 12-lead EKG negative for ischemia, this is likely does not represent ACS. -Also has negative d-dimer, chest pain resolved. -This is could be secondary to reflux, patient has chronic abdominal pain issues.  Diabetes mellitus type 2 -Uncontrolled diabetes mellitus type 2 with presumed gastroparesis. -Started on carbohydrate modified diet, restarted her insulin and held oral hypoglycemic agents.  -At the time of discharge restarted  back on her home insulin regimen.  CKD stage II to 3 -Creatinine is 1.2 at the time of discharge, patient is diabetic and needs to be on ACEI or ARB. -She reported that she has "bad allergy" to lisinopril, I don't know what ARB the free clinic at the [facility] provides. -Will let her primary care provider decides which ARB can get cheaper.  Hypokalemia and hypomagnesemia -Potassium is 2.9, magnesium of 1.4. -Both repleted with oral and IV supplements. Potassium is 3.9 today and magnesium is 2.5 yesterday.  Gastroparesis -Has history of chronic nausea, vomiting and abdominal pain. -Did not have gastric emptying study, but she is on Reglan anyway, reported minimal symptoms improvement with it. -Needs to follow-up with gastroenterology as outpatient for further evaluation of her chronic abdominal pain. -Patient was very argumentative about pain medications.  Morbid obesity and obstructive sleep apnea -Counseled about weight loss and how that can help her to control her diabetes. -Reported she has obstructive sleep apnea but she does not use CPAP because she doesn't have it. -Follow-up with PCP for further evaluation.  Emotional stress -On the day of discharge patient was very emotional that her younger brother in the same floor (4W) at [facility]. -Her younger brother is diabetic and has diabetic foot ulcer which he is getting left great toe ray amputation today.   Discharge Instructions  Discharge Instructions    Diet - low sodium heart healthy    Complete by:  As directed   Increase activity slowly    Complete by:  As directed       Medication List    TAKE these medications   albuterol 108 (90 Base) MCG/ACT inhaler Commonly known as:  PROVENTIL HFA;VENTOLIN HFA Inhale 2 puffs into the lungs  every 6 (six) hours as needed for wheezing or shortness of breath.   allopurinol 100 MG tablet Commonly known as:  ZYLOPRIM Take 100 mg by mouth daily.   amLODipine  10 MG tablet Commonly known as:  NORVASC Take 10 mg by mouth daily.   dicyclomine 20 MG tablet Commonly known as:  BENTYL Take 20 mg by mouth every 6 (six) hours as needed for spasms.   DULoxetine 30 MG capsule Commonly known as:  CYMBALTA Take 30 mg by mouth daily.   ergocalciferol 50000 units capsule Commonly known as:  VITAMIN D2 Take 50,000 Units by mouth once a week.   hydrochlorothiazide 25 MG tablet Commonly known as:  HYDRODIURIL Take 25 mg by mouth daily.   insulin aspart protamine- aspart (70-30) 100 UNIT/ML injection Commonly known as:  NOVOLOG MIX 70/30 Inject 50-75 Units into the skin. 75 units every morning, 60 units with lunch, and 50 units at dinner.   metoCLOPramide 10 MG tablet Commonly known as:  REGLAN Take 10 mg by mouth every 6 (six) hours as needed for nausea or vomiting.   metoprolol succinate 25 MG 24 hr tablet Commonly known as:  TOPROL-XL Take 25 mg by mouth daily.   nitroGLYCERIN 0.4 MG SL tablet Commonly known as:  NITROSTAT Place 0.4 mg under the tongue every 5 (five) minutes as needed for chest pain.   Oxycodone HCl 10 MG Tabs Take 10 mg by mouth every 8 (eight) hours as needed (severe pain).   pantoprazole 40 MG tablet Commonly known as:  PROTONIX Take 40 mg by mouth 2 (two) times daily.   pravastatin 10 MG tablet Commonly known as:  PRAVACHOL Take 10 mg by mouth daily.      Follow-up Information    EVERHART, Mateo Flow, PA Follow up in 1 week(s).   Specialty:  General Practice Contact information: [place] [address] [phone]          Allergies  Allergen Reactions  . Aspirin Other (See Comments)    Irritates stomach ulcer   . Lisinopril Swelling    Mouth   . Tylenol [Acetaminophen] Other (See Comments)    Irritates stomach ulcer   . Morphine And Related Palpitations  . Penicillins Palpitations    Has patient had a PCN reaction causing immediate rash, facial/tongue/throat swelling, SOB  or lightheadedness with hypotension: no-- heart racing  Has patient had a PCN reaction causing severe rash involving mucus membranes or skin necrosis: no Has patient had a PCN reaction that required hospitalization: no Has patient had a PCN reaction occurring within the last 10 years: no If all of the above answers are "NO", then may proceed with Cephalosporin use.     Consultations:  None Specify Physician/Group   Procedures/Studies: Dg Chest 2 View  Result Date: 09/26/2015 CLINICAL DATA:  Chest and left arm pain, nausea/vomiting EXAM: CHEST  2 VIEW COMPARISON:  None. FINDINGS: Lungs are clear.  No pleural effusion or pneumothorax. The heart is normal in size. Visualized osseous structures are within normal limits. IMPRESSION: No evidence of acute cardiopulmonary disease. Electronically Signed   By: Julian Hy M.D.   On: 09/26/2015 13:08   (Echo, Carotid, EGD, Colonoscopy, ERCP)    Subjective:   Discharge Exam: Vitals:   09/28/15 0605 09/28/15 0900  BP: (!) 111/59 (!) 143/85  Pulse: 91   Resp: 20   Temp: 98.3 F (36.8 C)    Vitals:   09/27/15 2200 09/28/15 0155 09/28/15 0605 09/28/15 0900  BP:  131/61 (!) 102/59 (!) 111/59 (!) 143/85  Pulse: 96 97 91   Resp: 20 (!) 22 20   Temp: 98.6 F (37 C) 98.3 F (36.8 C) 98.3 F (36.8 C)   TempSrc: Oral Oral Oral   SpO2: 98% 99% 100%   Weight:      Height:        General: Pt is alert, awake, not in acute distress Cardiovascular: RRR, S1/S2 +, no rubs, no gallops Respiratory: CTA bilaterally, no wheezing, no rhonchi Abdominal: Soft, NT, ND, bowel sounds + Extremities: no edema, no cyanosis    The results of significant diagnostics from this hospitalization (including imaging, microbiology, ancillary and laboratory) are listed below for reference.     Microbiology: No results found for this or any previous visit (from the past 240 hour(s)).   Labs: BNP (last 3 results) No results for input(s): BNP in the last  8760 hours. Basic Metabolic Panel:  Recent Labs Lab 09/26/15 1202 09/27/15 0808 09/27/15 1455 09/28/15 0459  NA 134* 135 136 136  K 2.9* 3.1* 3.6 3.9  CL 94* 99* 101 105  CO2 26 27 26 25   GLUCOSE 281* 297* 265* 274*  BUN 7 10 10 13   CREATININE 0.93 0.99 1.17* 1.20*  CALCIUM 9.3 8.6* 8.7* 8.6*  MG  --  1.4* 2.5*  --    Liver Function Tests: No results for input(s): AST, ALT, ALKPHOS, BILITOT, PROT, ALBUMIN in the last 168 hours. No results for input(s): LIPASE, AMYLASE in the last 168 hours. No results for input(s): AMMONIA in the last 168 hours. CBC:  Recent Labs Lab 09/26/15 1202 09/28/15 0459  WBC 10.7* 9.4  HGB 12.3 9.5*  HCT 37.7 30.4*  MCV 90.0 93.3  PLT 405* 306   Cardiac Enzymes:  Recent Labs Lab 09/26/15 1202 09/26/15 1652 09/26/15 2255  TROPONINI <0.03 <0.03 <0.03   BNP: Invalid input(s): POCBNP CBG:  Recent Labs Lab 09/27/15 0747 09/27/15 1148 09/27/15 1646 09/27/15 2203 09/28/15 0758  GLUCAP 300* 250* 292* 247* 327*   D-Dimer  Recent Labs  09/26/15 2255  DDIMER 0.42   Hgb A1c No results for input(s): HGBA1C in the last 72 hours. Lipid Profile No results for input(s): CHOL, HDL, LDLCALC, TRIG, CHOLHDL, LDLDIRECT in the last 72 hours. Thyroid function studies No results for input(s): TSH, T4TOTAL, T3FREE, THYROIDAB in the last 72 hours.  Invalid input(s): FREET3 Anemia work up No results for input(s): VITAMINB12, FOLATE, FERRITIN, TIBC, IRON, RETICCTPCT in the last 72 hours. Urinalysis No results found for: COLORURINE, APPEARANCEUR, LABSPEC, [place], GLUCOSEU, HGBUR, BILIRUBINUR, KETONESUR, PROTEINUR, UROBILINOGEN, NITRITE, LEUKOCYTESUR Sepsis Labs Invalid input(s): PROCALCITONIN,  WBC,  LACTICIDVEN Microbiology No results found for this or any previous visit (from the past 240 hour(s)).   Time coordinating discharge: Over 30 minutes  SIGNED:   Birdie Hopes, MD  [facility] 09/28/2015, 11:24 AM Pager   If  7PM-7AM, please contact night-coverage [email or web address] Password [email or web address]

## 2015-09-28 NOTE — Progress Notes (Signed)
Went over all discharge information with patient and family.  Medications explained and educated on next due dose.  Advised patient to get a ride home due to taking pain medications this morning. Patient stated she felt fine, was not dizzy or sleepy. Patient was steady on her feet and alert and oriented time 4. Pt wheeled out by NT>

## 2015-09-29 ENCOUNTER — Encounter (HOSPITAL_COMMUNITY): Payer: Self-pay | Admitting: Emergency Medicine

## 2015-11-03 ENCOUNTER — Encounter (HOSPITAL_COMMUNITY): Payer: Self-pay | Admitting: Emergency Medicine

## 2015-11-03 ENCOUNTER — Emergency Department (HOSPITAL_COMMUNITY)
Admission: EM | Admit: 2015-11-03 | Discharge: 2015-11-03 | Disposition: A | Discharge status: Home / Self Care | Payer: Medicaid Other | Attending: Emergency Medicine | Admitting: Emergency Medicine

## 2015-11-03 ENCOUNTER — Emergency Department (HOSPITAL_COMMUNITY): Payer: Medicaid Other

## 2015-11-03 DIAGNOSIS — I1310 Hypertensive heart and chronic kidney disease without heart failure, with stage 1 through stage 4 chronic kidney disease, or unspecified chronic kidney disease: Secondary | ICD-10-CM | POA: Insufficient documentation

## 2015-11-03 DIAGNOSIS — R0789 Other chest pain: Secondary | ICD-10-CM | POA: Insufficient documentation

## 2015-11-03 DIAGNOSIS — Z8673 Personal history of transient ischemic attack (TIA), and cerebral infarction without residual deficits: Secondary | ICD-10-CM | POA: Insufficient documentation

## 2015-11-03 DIAGNOSIS — N183 Chronic kidney disease, stage 3 (moderate): Secondary | ICD-10-CM | POA: Diagnosis not present

## 2015-11-03 DIAGNOSIS — R739 Hyperglycemia, unspecified: Secondary | ICD-10-CM

## 2015-11-03 DIAGNOSIS — E1165 Type 2 diabetes mellitus with hyperglycemia: Secondary | ICD-10-CM | POA: Diagnosis not present

## 2015-11-03 DIAGNOSIS — R110 Nausea: Secondary | ICD-10-CM | POA: Insufficient documentation

## 2015-11-03 DIAGNOSIS — R0602 Shortness of breath: Secondary | ICD-10-CM | POA: Diagnosis present

## 2015-11-03 DIAGNOSIS — Z79899 Other long term (current) drug therapy: Secondary | ICD-10-CM | POA: Diagnosis not present

## 2015-11-03 DIAGNOSIS — R112 Nausea with vomiting, unspecified: Secondary | ICD-10-CM

## 2015-11-03 DIAGNOSIS — E1122 Type 2 diabetes mellitus with diabetic chronic kidney disease: Secondary | ICD-10-CM | POA: Insufficient documentation

## 2015-11-03 DIAGNOSIS — R109 Unspecified abdominal pain: Secondary | ICD-10-CM | POA: Insufficient documentation

## 2015-11-03 DIAGNOSIS — Z7951 Long term (current) use of inhaled steroids: Secondary | ICD-10-CM | POA: Diagnosis not present

## 2015-11-03 DIAGNOSIS — Z7984 Long term (current) use of oral hypoglycemic drugs: Secondary | ICD-10-CM | POA: Insufficient documentation

## 2015-11-03 LAB — CBC
HCT: 34.3 % — ABNORMAL LOW (ref 36.0–46.0)
HEMOGLOBIN: 11.5 g/dL — AB (ref 12.0–15.0)
MCH: 29.3 pg (ref 26.0–34.0)
MCHC: 33.5 g/dL (ref 30.0–36.0)
MCV: 87.3 fL (ref 78.0–100.0)
Platelets: 376 10*3/uL (ref 150–400)
RBC: 3.93 MIL/uL (ref 3.87–5.11)
RDW: 13 % (ref 11.5–15.5)
WBC: 12.4 10*3/uL — AB (ref 4.0–10.5)

## 2015-11-03 LAB — BLOOD GAS, VENOUS
Acid-Base Excess: 2 mmol/L (ref 0.0–2.0)
Bicarbonate: 25.1 mEq/L — ABNORMAL HIGH (ref 20.0–24.0)
O2 SAT: 77.1 %
PATIENT TEMPERATURE: 98.6
TCO2: 22.6 mmol/L (ref 0–100)
pCO2, Ven: 35.2 mmHg — ABNORMAL LOW (ref 45.0–50.0)
pH, Ven: 7.466 — ABNORMAL HIGH (ref 7.250–7.300)
pO2, Ven: 41.2 mmHg (ref 31.0–45.0)

## 2015-11-03 LAB — BASIC METABOLIC PANEL
ANION GAP: 13 (ref 5–15)
BUN: 11 mg/dL (ref 6–20)
CALCIUM: 9 mg/dL (ref 8.9–10.3)
CHLORIDE: 95 mmol/L — AB (ref 101–111)
CO2: 25 mmol/L (ref 22–32)
Creatinine, Ser: 0.96 mg/dL (ref 0.44–1.00)
Glucose, Bld: 393 mg/dL — ABNORMAL HIGH (ref 65–99)
Potassium: 3.1 mmol/L — ABNORMAL LOW (ref 3.5–5.1)
Sodium: 133 mmol/L — ABNORMAL LOW (ref 135–145)

## 2015-11-03 LAB — I-STAT TROPONIN, ED: TROPONIN I, POC: 0.01 ng/mL (ref 0.00–0.08)

## 2015-11-03 MED ORDER — SODIUM CHLORIDE 0.9 % IV BOLUS (SEPSIS)
1000.0000 mL | Freq: Once | INTRAVENOUS | Status: AC
  Administered 2015-11-03: 1000 mL via INTRAVENOUS

## 2015-11-03 MED ORDER — DICYCLOMINE HCL 10 MG/ML IM SOLN
20.0000 mg | Freq: Once | INTRAMUSCULAR | Status: AC
  Administered 2015-11-03: 20 mg via INTRAMUSCULAR
  Filled 2015-11-03: qty 2

## 2015-11-03 MED ORDER — HYDROMORPHONE HCL 1 MG/ML IJ SOLN
1.0000 mg | Freq: Once | INTRAMUSCULAR | Status: AC
  Administered 2015-11-03: 1 mg via INTRAVENOUS
  Filled 2015-11-03: qty 1

## 2015-11-03 MED ORDER — HALOPERIDOL LACTATE 5 MG/ML IJ SOLN
2.0000 mg | Freq: Once | INTRAMUSCULAR | Status: AC
  Administered 2015-11-03: 2 mg via INTRAVENOUS
  Filled 2015-11-03: qty 1

## 2015-11-03 MED ORDER — ONDANSETRON HCL 4 MG/2ML IJ SOLN
4.0000 mg | Freq: Once | INTRAMUSCULAR | Status: AC
  Administered 2015-11-03: 4 mg via INTRAVENOUS
  Filled 2015-11-03: qty 2

## 2015-11-03 NOTE — Discharge Instructions (Signed)
As discussed, your evaluation today has been largely reassuring.  But, it is important that you monitor your condition carefully, and do not hesitate to return to the ED if you develop new, or concerning changes in your condition. ? ?Otherwise, please follow-up with your physician for appropriate ongoing care. ? ?

## 2015-11-03 NOTE — ED Notes (Signed)
MD at bedside. 

## 2015-11-03 NOTE — ED Provider Notes (Signed)
Deborah [name] Provider Note   [name]: [id number] Arrival date & time: 11/03/15  1120     History   Chief Complaint Chief Complaint  Patient presents with  . Chest Pain  . Emesis  . [name]    HPI Deborah [name] is a 50 y.o. female.  HPI   Patient presents with chest pain, abdominal pain, nausea, vomiting, generalized discomfort. Symptoms began 2 days ago. Since onset symptoms of been persistent, with severe chest pain, worse with motion, activity, breathing. There is associated generalized discomfort, but no other focal pain beyond the chest and abdomen. Patient has not been taking all medication as directed since onset of symptoms, and there are no clear alleviating or exacerbating factors beyond those above. Patient acknowledges a history of insulin-dependent diabetes, voices fear of DKA. She denies history of cardiac disease.  Past Medical History:  Diagnosis Date  . Diabetes mellitus   . Fibromyalgia   . Gastric ulcer   . Gastroparesis   . Hypertension   . Pneumonia   . Stroke ([place]) 02/2013  . Stroke [facility])     Patient Active Problem List   Diagnosis Date Noted  . Chest pain 09/26/2015  . Hypokalemia 09/26/2015  . DM (diabetes mellitus), type 2, uncontrolled ([place]) 09/26/2015  . Hypomagnesemia 09/26/2015  . Gastroparesis 09/26/2015  . Gastroparesis 09/21/2015  . Hypokalemia 08/20/2015  . Hypomagnesemia 08/20/2015  . Nausea and vomiting 08/20/2015  . UTI (urinary tract infection) 05/27/2015  . Gout flare 05/27/2015  . Leukocytosis 05/26/2015  . Nausea with vomiting 05/26/2015  . Abdominal pain 05/26/2015  . [name] 05/26/2015  . DKA (diabetic ketoacidoses) ([place]) 05/25/2015  . Uncontrolled type 2 diabetes mellitus with diabetic neuropathy, with long-term current use of insulin ([place]) 05/25/2015  . Dyslipidemia associated with type 2 diabetes mellitus ([place]) 05/25/2015  . CKD (chronic kidney disease) stage 3, GFR 30-59  ml/min 05/25/2015  . Essential hypertension, benign 09/28/2013    Past Surgical History:  Procedure Laterality Date  . CATARACT EXTRACTION  01/2014  . CHOLECYSTECTOMY      OB History    No data available       Home Medications    Prior to Admission medications   Medication Sig Start Date End Date Taking? Authorizing Provider  albuterol (PROVENTIL HFA;VENTOLIN HFA) 108 (90 Base) MCG/ACT inhaler Inhale 2 puffs into the lungs every 6 (six) hours as needed for wheezing or shortness of breath. 09/23/15  Yes Debbe Odea, MD  allopurinol (ZYLOPRIM) 100 MG tablet Take 1 tablet (100 mg total) by mouth daily. 09/23/15  Yes Debbe Odea, MD  amLODipine (NORVASC) 10 MG tablet Take 1 tablet (10 mg total) by mouth daily. 09/23/15  Yes Debbe Odea, MD  dicyclomine (BENTYL) 20 MG tablet Take 1 tablet (20 mg total) by mouth every 6 (six) hours as needed. Abdominal cramps 09/23/15  Yes Debbe Odea, MD  DULoxetine (CYMBALTA) 30 MG capsule Take 1 capsule (30 mg total) by mouth 2 (two) times daily. 09/23/15  Yes Debbe Odea, MD  hydrochlorothiazide (HYDRODIURIL) 25 MG tablet Take 25 mg by mouth daily.   Yes Historical Provider, MD  metoCLOPramide (REGLAN) 10 MG tablet Take 1 tablet (10 mg total) by mouth 4 (four) times daily -  before meals and at bedtime. Take 30 min prior to eating 09/23/15  Yes Debbe Odea, MD  metoprolol succinate (TOPROL-XL) 25 MG 24 hr tablet Take 1 tablet (25 mg total) by mouth daily. 09/23/15  Yes Debbe Odea, MD  nitroGLYCERIN (NITROSTAT) 0.4 MG SL tablet Place 0.4 mg under the tongue every 5 (five) minutes as needed for chest pain.   Yes Historical Provider, MD  NOVOLOG MIX 70/30 (70-30) 100 UNIT/ML injection Inject 0.5-0.75 mLs (50-75 Units total) into the skin 3 (three) times daily after meals. 75 units every morning, 60 units with lunch, and 50 units with dinner 09/23/15  Yes Debbe Odea, MD  Oxycodone HCl 10 MG TABS Take 10 mg by mouth every 8 (eight) hours as needed (severe  pain).  09/16/15  Yes Historical Provider, MD  pantoprazole (PROTONIX) 40 MG tablet Take 1 tablet (40 mg total) by mouth 2 (two) times daily. 09/23/15  Yes Debbe Odea, MD  pravastatin (PRAVACHOL) 10 MG tablet Take 1 tablet (10 mg total) by mouth daily. 09/23/15  Yes Debbe Odea, MD  ranitidine (ZANTAC) 150 MG tablet Take 1 tablet (150 mg total) by mouth daily as needed for heartburn. 09/23/15  Yes Debbe Odea, MD  Vitamin D, Ergocalciferol, (DRISDOL) 50000 units CAPS capsule Take 1 capsule (50,000 Units total) by mouth every 7 (seven) days. Patient taking differently: Take 50,000 Units by mouth every Monday.  09/23/15  Yes Debbe Odea, MD  senna (SENOKOT) 8.6 MG tablet Take 1 tablet (8.6 mg total) by mouth 2 (two) times daily. Patient not taking: Reported on 11/03/2015 09/23/15 09/22/16  Debbe Odea, MD  sucralfate (CARAFATE) 1 g tablet Take 1 tablet (1 g total) by mouth 4 (four) times daily. Patient not taking: Reported on 11/03/2015 09/23/15   Debbe Odea, MD    Family History Family History  Problem Relation Age of Onset  . Diabetes Mother   . Diabetes Father   . Heart disease Father   . Diabetes Sister   . Diabetes Brother     Social History Social History  Substance Use Topics  . Smoking status: Never Smoker  . Smokeless tobacco: Never Used  . Alcohol use No     Allergies   Lisinopril; Penicillins; Valium [diazepam]; Aspirin; Food; Nsaids; Tylenol [acetaminophen]; and Morphine and related   Review of Systems Review of Systems  Constitutional:       Per HPI, otherwise negative  HENT:       Per HPI, otherwise negative  Respiratory:       Per HPI, otherwise negative  Cardiovascular:       Per HPI, otherwise negative  Gastrointestinal: Positive for abdominal pain and nausea. Negative for vomiting.  Endocrine:       Negative aside from HPI  Genitourinary:       Neg aside from HPI   Musculoskeletal:       Per HPI, otherwise negative  Skin: Negative.   Neurological:  Negative for syncope.     Physical Exam Updated Vital Signs BP 150/92   Pulse 112   Temp 97.9 F (36.6 C) (Oral)   Resp (!) 27   Ht 5\' 6"  (1.676 m)   Wt (!) 310 lb (140.6 kg)   LMP 10/10/2012   SpO2 98%   BMI 50.04 kg/m   Physical Exam  Constitutional: She is oriented to person, place, and time. She appears well-developed and well-nourished.  Morbidly obese very uncomfortable appearing female sitting upright, speaking clearly  HENT:  Head: Normocephalic and atraumatic.  Eyes: Conjunctivae and EOM are normal.  Cardiovascular: Normal rate and regular rhythm.   Pulmonary/Chest: Effort normal and breath sounds normal. No stridor. No respiratory distress.  Abdominal: She exhibits no distension.  Patient describes abdominal pain, but has no  tenderness to palpation throughout the exam  Musculoskeletal: She exhibits no edema.  Neurological: She is alert and oriented to person, place, and time. No cranial nerve deficit.  Skin: Skin is warm and dry.  Psychiatric: Her mood appears anxious.  Nursing note and vitals reviewed.   EMR notable for recent evaluation with similar presentation, during which there was no evidence for ongoing ischemia.   ED Treatments / Results  Labs (all labs ordered are listed, but only abnormal results are displayed) Labs Reviewed  BASIC METABOLIC PANEL - Abnormal; Notable for the following:       Result Value   Sodium 133 (*)    Potassium 3.1 (*)    Chloride 95 (*)    Glucose, Bld 393 (*)    All other components within normal limits  CBC - Abnormal; Notable for the following:    WBC 12.4 (*)    Hemoglobin 11.5 (*)    HCT 34.3 (*)    All other components within normal limits  BLOOD GAS, VENOUS - Abnormal; Notable for the following:    pH, Ven 7.466 (*)    pCO2, Ven 35.2 (*)    Bicarbonate 25.1 (*)    All other components within normal limits  I-STAT TROPOININ, ED    EKG  EKG Interpretation  Date/Time:  Monday November 03 2015 11:28:31  EDT Ventricular Rate:  121 PR Interval:    QRS Duration: 87 QT Interval:  338 QTC Calculation: 480 R Axis:   -27 Text Interpretation:  Sinus tachycardia Left atrial enlargement Baseline wander ST-t wave abnormality Abnormal ekg Confirmed by Carmin Muskrat  MD ([id number]) on 11/03/2015 11:33:26 AM       Radiology Dg Chest 2 View  Result Date: 11/03/2015 CLINICAL DATA:  Chest pain since yesterday. EXAM: CHEST  2 VIEW COMPARISON:  None. FINDINGS: The cardiac silhouette, mediastinal and hilar contours are within normal limits given the AP projection and low lung volumes. No infiltrates or effusions. The bony thorax is intact. Mild degenerative changes involving both shoulders and both AC joints. IMPRESSION: No acute cardiopulmonary findings. Electronically Signed   By: Marijo Sanes M.D.   On: 11/03/2015 12:10    Procedures Procedures (including critical care time)  Medications Ordered in ED Medications  HYDROmorphone (DILAUDID) injection 1 mg (not administered)  ondansetron (ZOFRAN) injection 4 mg (not administered)  sodium chloride 0.9 % bolus 1,000 mL (0 mLs Intravenous Stopped 11/03/15 1512)  haloperidol lactate (HALDOL) injection 2 mg (2 mg Intravenous Given 11/03/15 1244)  dicyclomine (BENTYL) injection 20 mg (20 mg Intramuscular Given 11/03/15 1331)  ondansetron (ZOFRAN) injection 4 mg (4 mg Intravenous Given 11/03/15 1512)  HYDROmorphone (DILAUDID) injection 1 mg (1 mg Intravenous Given 11/03/15 1512)  sodium chloride 0.9 % bolus 1,000 mL (1,000 mLs Intravenous New Bag/Given 11/03/15 1549)     Initial Impression / Assessment and Plan / ED Course  I have reviewed the triage vital signs and the nursing notes.  Pertinent labs & imaging results that were available during my care of the patient were reviewed by me and considered in my medical decision making (see chart for details).  Clinical Course    5:30 PM Patient calm, as she has been on multiple prior repeat evaluations, we  again discussed all findings, need to follow-up with primary care.   Final Clinical Impressions(s) / ED Diagnoses  Patient with multiple medical issues presents with generalized discomfort, chest pain, belly pain. Patient has recent evaluation for similar presentation, with reassuring evaluation in terms  of cardiac pathology. Here, the patient has no evidence for ongoing coronary ischemia. No evidence for peritonitis. Patient does have mild tachycardia, but given her history of gastroparesis, there suspicion for acute on chronic condition. No evidence for bowel obstruction. Patient had substantial reduction in pain, nausea, and with the aforementioned reassuring findings, she was discharged in stable condition to follow-up with primary care.   Carmin Muskrat, MD 11/03/15 [phone]

## 2015-11-03 NOTE — ED Triage Notes (Signed)
Pt c/o N/V and abdominal pain since yesterday. Pt c/o chest pain since this morning described as a pressure up the middle of her chest. Pt sts pain radiates to L shoulder. Pt c/o diarrhea. Pt also c/o SOB since yesterday. Pt speaking in complete sentences, sts pain in chest is worse when she talks, pain in chest worsens with movement. A&Ox4. Pt distraught in triage, not following instructions well and wailing.

## 2015-11-03 NOTE — ED Notes (Signed)
MD at bedside. EDP [place]

## 2015-11-03 NOTE — ED Notes (Signed)
Pt reports understanding of discharge information. No questions at time of discharge 

## 2015-11-03 NOTE — ED Notes (Signed)
Patient transported to X-ray 

## 2015-11-03 NOTE — ED Notes (Signed)
Pt given water to drink- tolerated fluids

## 2015-11-11 IMAGING — CT CT HEAD W/O CM
1 series · 16 of 30 positions shown, 20 images · non-contrast
Comparison: Prior CT from 08/12/2009

CLINICAL DATA: weakness left sided, hx of CVA with left weakness.

EXAM:
CT HEAD WITHOUT CONTRAST
TECHNIQUE: Contiguous axial images were obtained from the base of the skull
through the vertex without intravenous contrast.

[Series 2: head 5.0 h30s · axial · 0.46mm/px · z∈[-116,+24]mm · 16 of 32 slices shown, 20 images]
[im 2/32  brain]
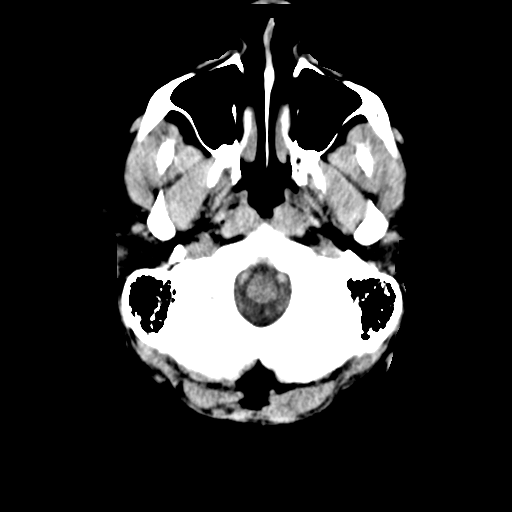
[im 2/32  bone]
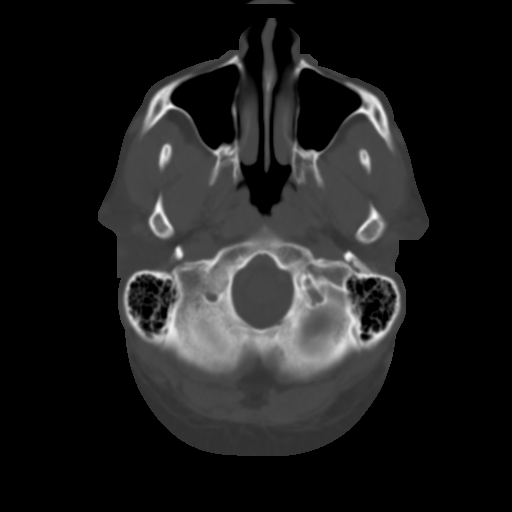
[im 4/32  brain]
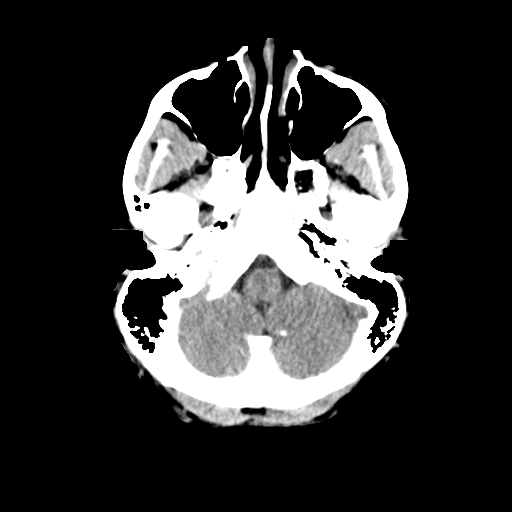
[im 6/32  brain]
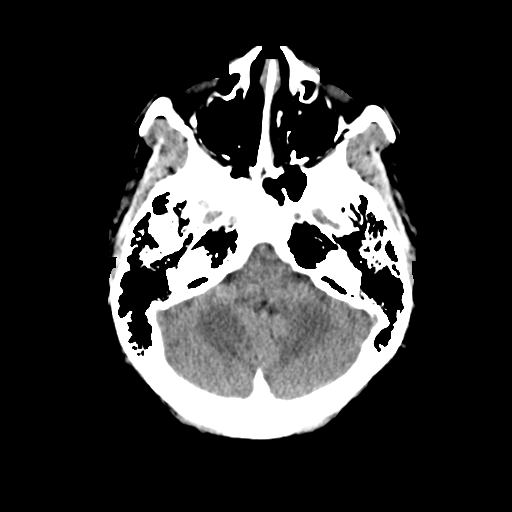
[im 8/32  brain]
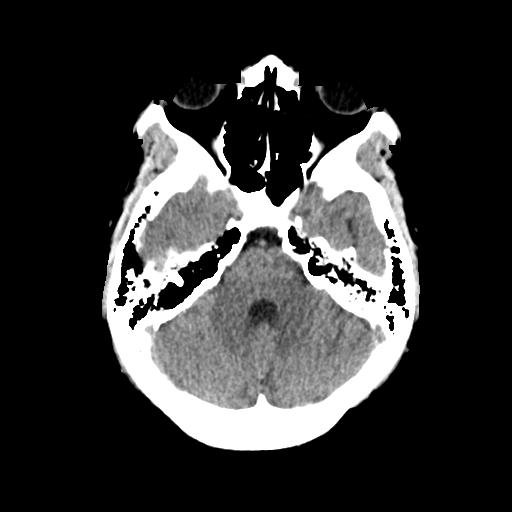
[im 9/32  brain]
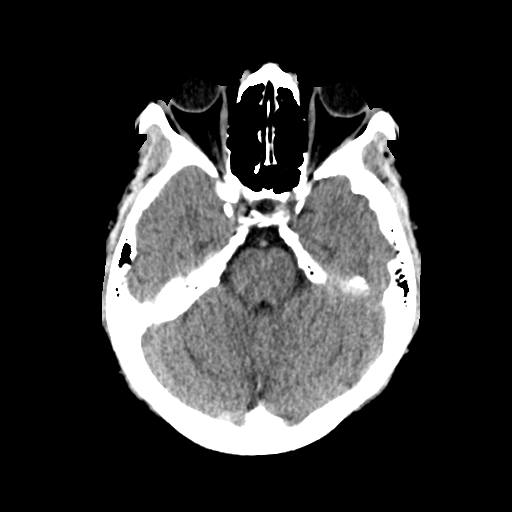
[im 9/32  bone]
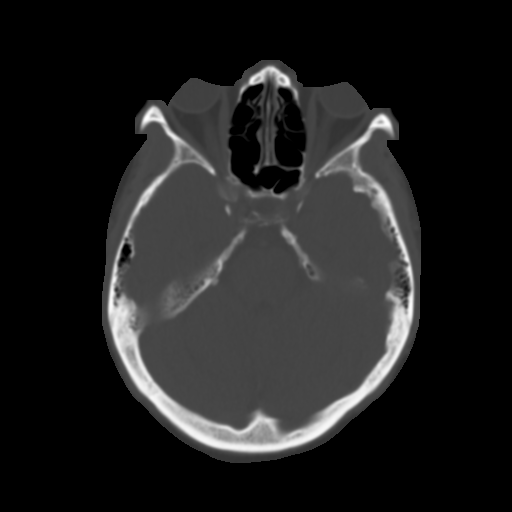
[im 11/32  brain]
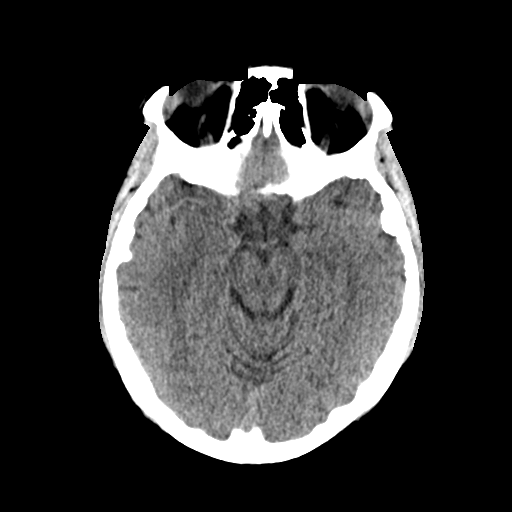
[im 13/32  brain]
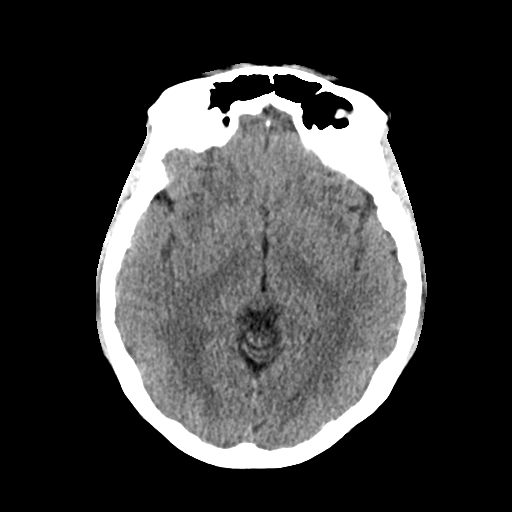
[im 15/32  brain]
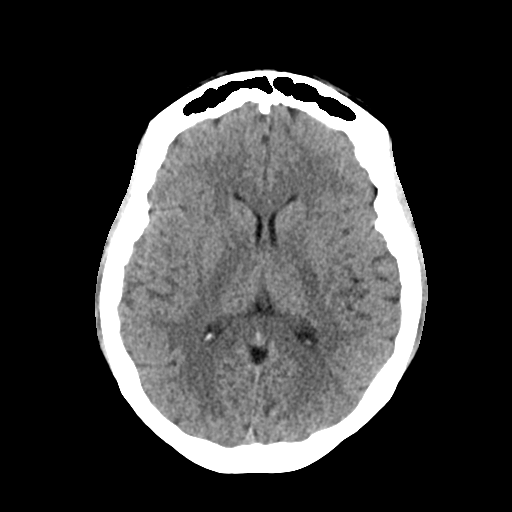
[im 17/32  brain]
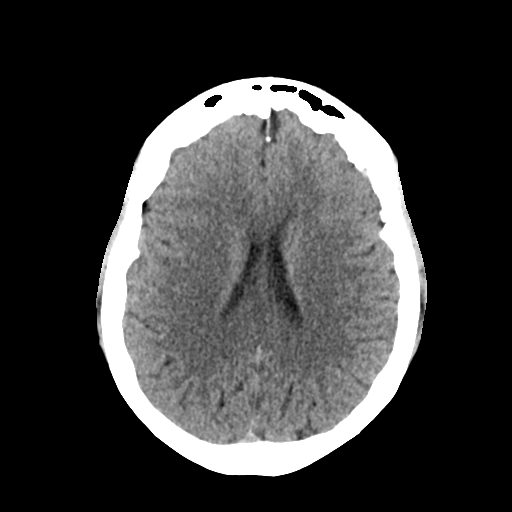
[im 17/32  bone]
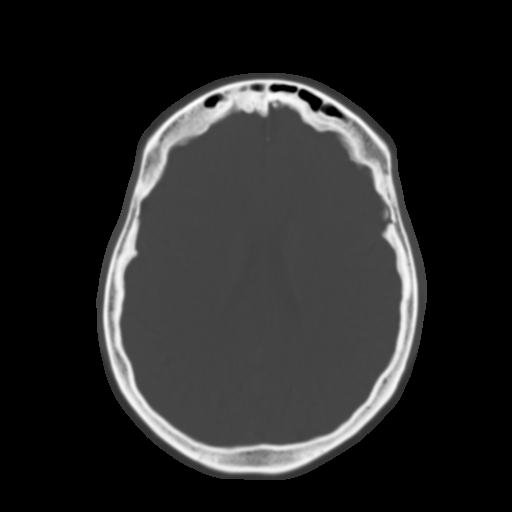
[im 19/32  brain]
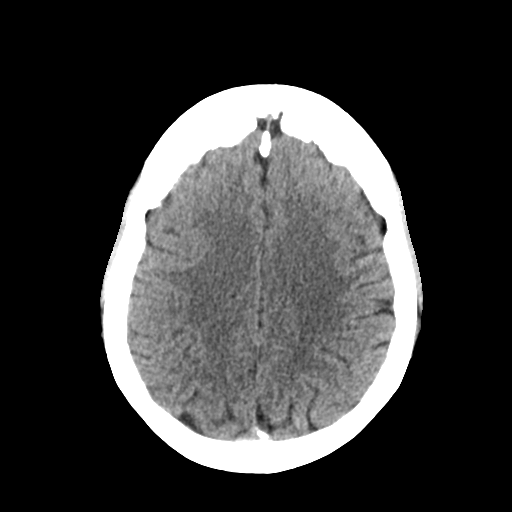
[im 21/32  brain]
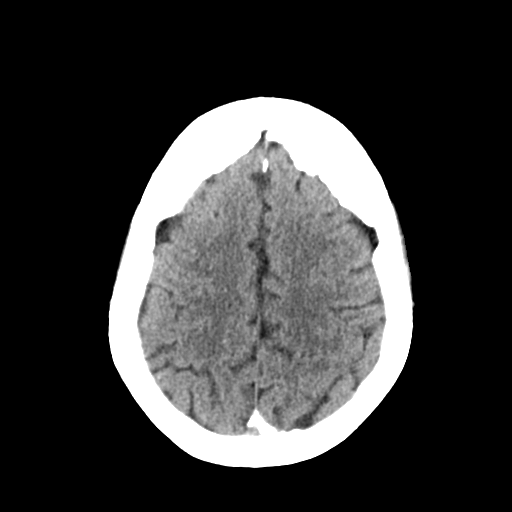
[im 23/32  brain]
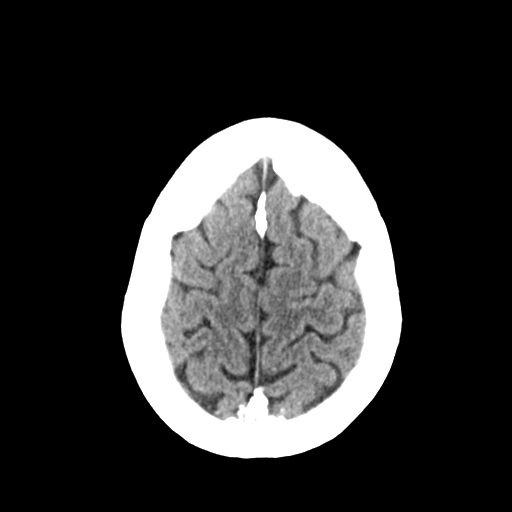
[im 24/32  brain]
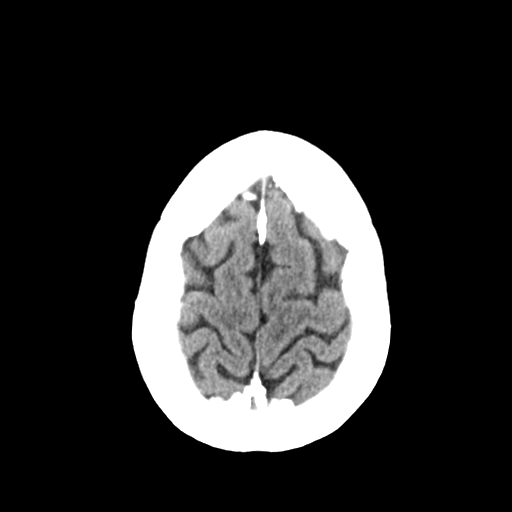
[im 24/32  bone]
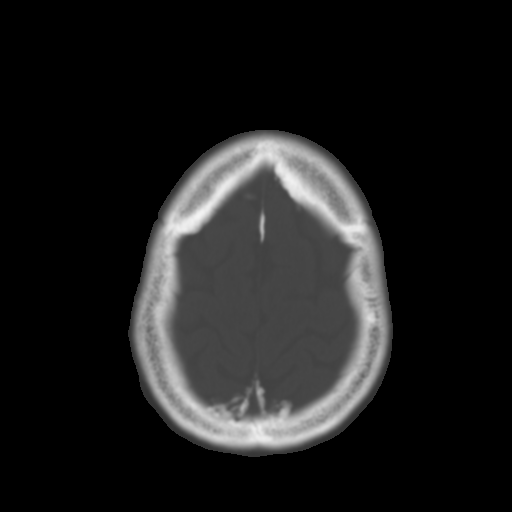
[im 26/32  brain]
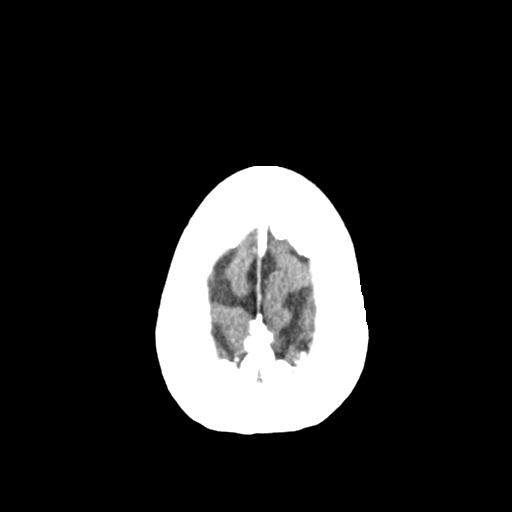
[im 28/32  brain]
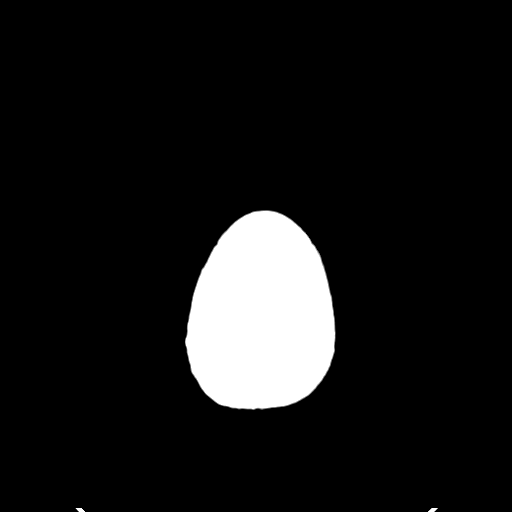
[im 30/32  brain]
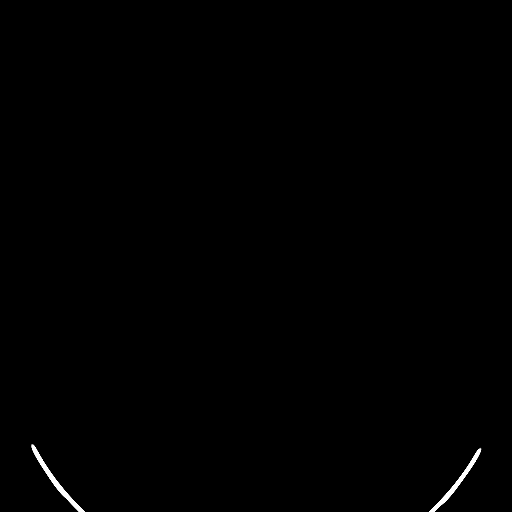

[16 of 30 positions shown; findings below may reference images not displayed]

FINDINGS: There is no acute intracranial hemorrhage or infarct. No mass lesion
or midline shift. Gray-white matter differentiation is well
maintained. Ventricles are normal in size without evidence of
hydrocephalus. CSF containing spaces are within normal limits. No
extra-axial fluid collection.

The calvarium is intact.

Orbital soft tissues are within normal limits.

The paranasal sinuses and mastoid air cells are well pneumatized and
free of fluid.

Scalp soft tissues are unremarkable.
IMPRESSION: Normal head CT with no acute intracranial abnormality identified.

## 2015-11-11 IMAGING — CR DG CHEST 1V PORT
1 series · 1 of 1 positions shown · non-contrast
Comparison: 07/19/2013

CLINICAL DATA: Chest pain.  Hypertension.  Diabetes.

EXAM:
PORTABLE CHEST - 1 VIEW

[portable]
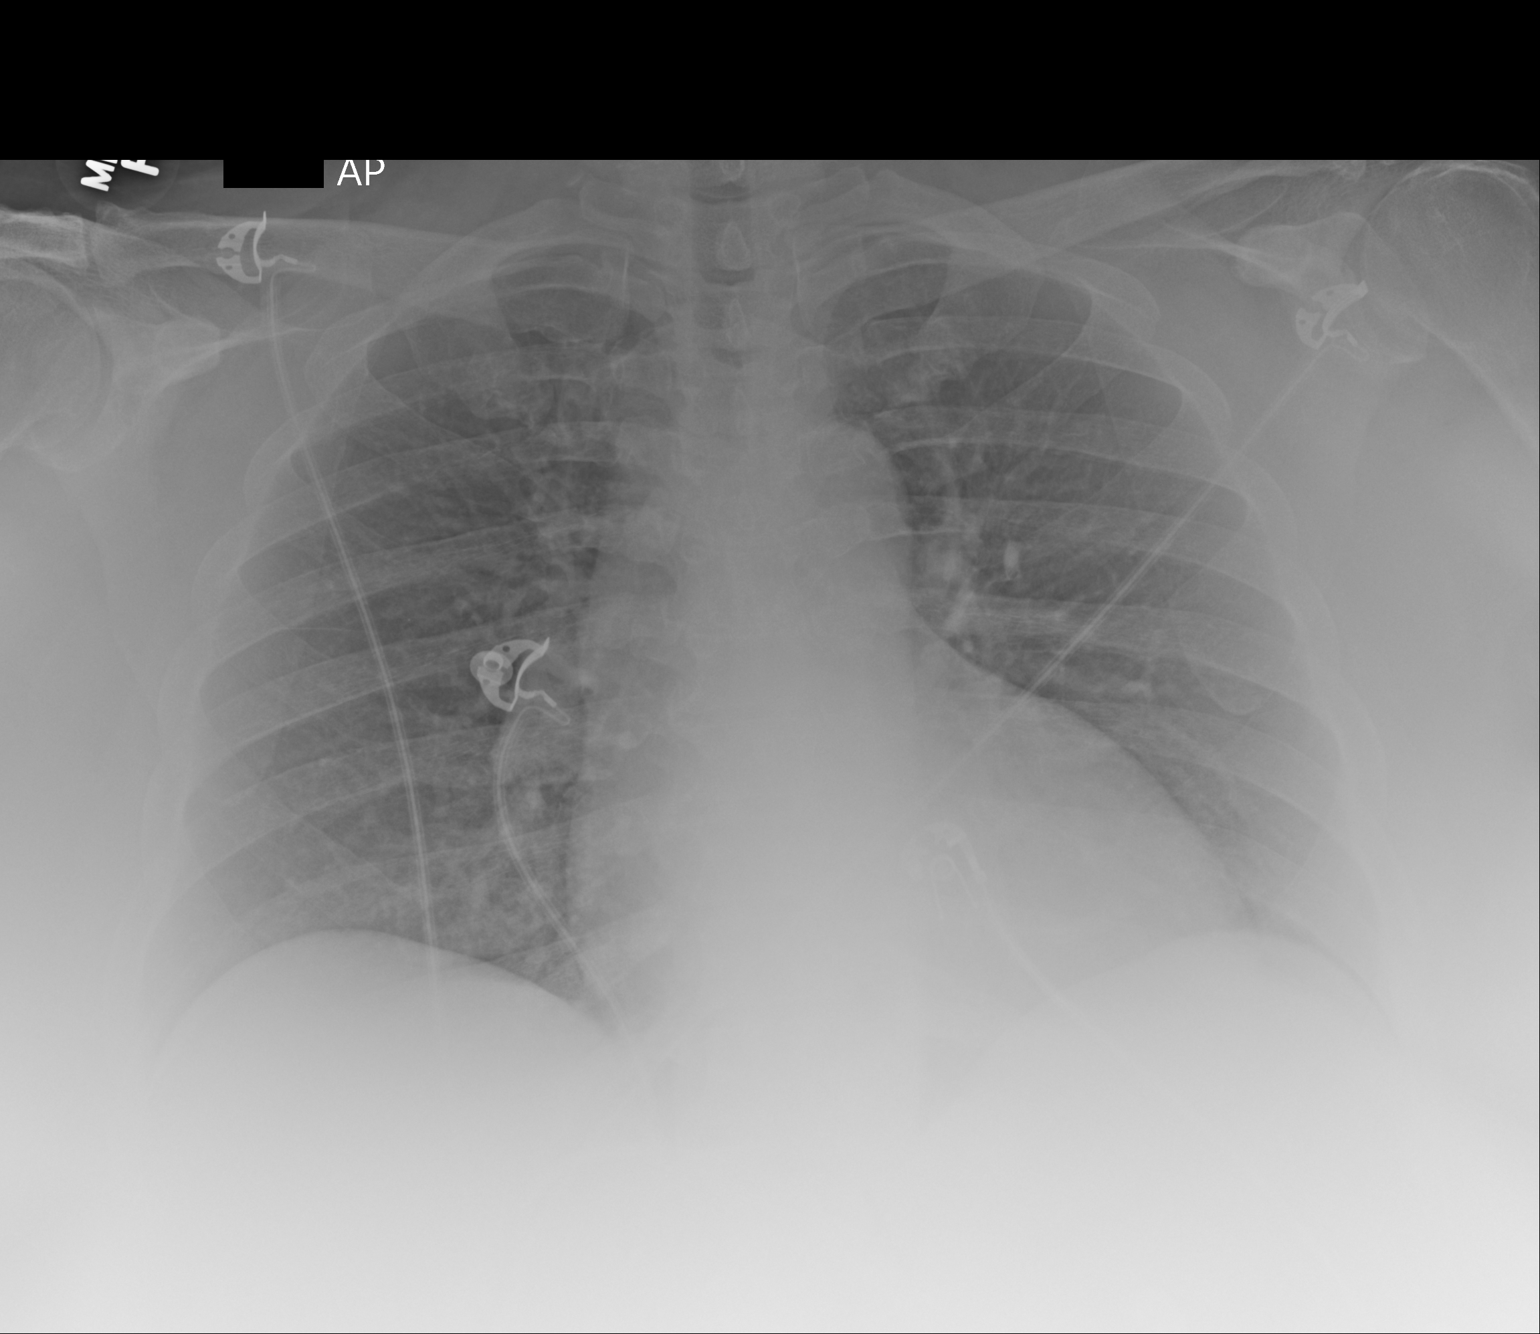

[1 of 1 positions shown; findings below may reference images not displayed]

FINDINGS: Thoracic spondylosis. Borderline enlarged cardiopericardial
silhouette. No pulmonary edema. No pleural effusion. The lungs
appear clear.
IMPRESSION: 1. Borderline enlargement of the cardiopericardial silhouette.
2. Thoracic spondylosis.

## 2015-12-05 ENCOUNTER — Emergency Department (HOSPITAL_COMMUNITY)
Admission: EM | Admit: 2015-12-05 | Discharge: 2015-12-05 | Disposition: A | Discharge status: Home / Self Care | Payer: Medicaid Other | Attending: Emergency Medicine | Admitting: Emergency Medicine

## 2015-12-05 ENCOUNTER — Emergency Department (HOSPITAL_COMMUNITY): Payer: Medicaid Other

## 2015-12-05 DIAGNOSIS — I129 Hypertensive chronic kidney disease with stage 1 through stage 4 chronic kidney disease, or unspecified chronic kidney disease: Secondary | ICD-10-CM | POA: Diagnosis not present

## 2015-12-05 DIAGNOSIS — R079 Chest pain, unspecified: Secondary | ICD-10-CM

## 2015-12-05 DIAGNOSIS — R112 Nausea with vomiting, unspecified: Secondary | ICD-10-CM

## 2015-12-05 DIAGNOSIS — R0602 Shortness of breath: Secondary | ICD-10-CM

## 2015-12-05 DIAGNOSIS — E860 Dehydration: Secondary | ICD-10-CM

## 2015-12-05 DIAGNOSIS — E1122 Type 2 diabetes mellitus with diabetic chronic kidney disease: Secondary | ICD-10-CM | POA: Diagnosis not present

## 2015-12-05 DIAGNOSIS — E1165 Type 2 diabetes mellitus with hyperglycemia: Secondary | ICD-10-CM | POA: Insufficient documentation

## 2015-12-05 DIAGNOSIS — N183 Chronic kidney disease, stage 3 (moderate): Secondary | ICD-10-CM | POA: Diagnosis not present

## 2015-12-05 DIAGNOSIS — Z79899 Other long term (current) drug therapy: Secondary | ICD-10-CM | POA: Insufficient documentation

## 2015-12-05 DIAGNOSIS — R739 Hyperglycemia, unspecified: Secondary | ICD-10-CM

## 2015-12-05 LAB — BASIC METABOLIC PANEL
Anion gap: 14 (ref 5–15)
BUN: 17 mg/dL (ref 6–20)
CALCIUM: 9.3 mg/dL (ref 8.9–10.3)
CHLORIDE: 98 mmol/L — AB (ref 101–111)
CO2: 21 mmol/L — ABNORMAL LOW (ref 22–32)
CREATININE: 1.59 mg/dL — AB (ref 0.44–1.00)
GFR, EST AFRICAN AMERICAN: 42 mL/min — AB (ref >60)
GFR, EST NON AFRICAN AMERICAN: 37 mL/min — AB (ref >60)
Glucose, Bld: 430 mg/dL — ABNORMAL HIGH (ref 65–99)
Potassium: 3.2 mmol/L — ABNORMAL LOW (ref 3.5–5.1)
SODIUM: 133 mmol/L — AB (ref 135–145)

## 2015-12-05 LAB — BLOOD GAS, VENOUS
ACID-BASE DEFICIT: 2.5 mmol/L — AB (ref 0.0–2.0)
Bicarbonate: 22.5 mmol/L (ref 20.0–28.0)
DRAWN BY: 295031
FIO2: 21
O2 SAT: 64.7 %
PATIENT TEMPERATURE: 98.6
pCO2, Ven: 42.3 mmHg — ABNORMAL LOW (ref 44.0–60.0)
pH, Ven: 7.345 (ref 7.250–7.430)
pO2, Ven: 37.3 mmHg (ref 32.0–45.0)

## 2015-12-05 LAB — URINE MICROSCOPIC-ADD ON: WBC, UA: NONE SEEN WBC/hpf (ref 0–5)

## 2015-12-05 LAB — CBC
HCT: 34.3 % — ABNORMAL LOW (ref 36.0–46.0)
Hemoglobin: 11.2 g/dL — ABNORMAL LOW (ref 12.0–15.0)
MCH: 29 pg (ref 26.0–34.0)
MCHC: 32.7 g/dL (ref 30.0–36.0)
MCV: 88.9 fL (ref 78.0–100.0)
PLATELETS: 351 10*3/uL (ref 150–400)
RBC: 3.86 MIL/uL — AB (ref 3.87–5.11)
RDW: 13.9 % (ref 11.5–15.5)
WBC: 11.1 10*3/uL — AB (ref 4.0–10.5)

## 2015-12-05 LAB — CBG MONITORING, ED
GLUCOSE-CAPILLARY: 418 mg/dL — AB (ref 65–99)
GLUCOSE-CAPILLARY: 263 mg/dL — AB (ref 65–99)

## 2015-12-05 LAB — URINALYSIS, ROUTINE W REFLEX MICROSCOPIC
BILIRUBIN URINE: NEGATIVE
Glucose, UA: >1000 mg/dL — AB
KETONES UR: NEGATIVE mg/dL
Leukocytes, UA: NEGATIVE
Nitrite: NEGATIVE
PROTEIN: 30 mg/dL — AB
SPECIFIC GRAVITY, URINE: 1.027 (ref 1.005–1.030)
pH: 5.5 (ref 5.0–8.0)

## 2015-12-05 LAB — I-STAT TROPONIN, ED
TROPONIN I, POC: 0.01 ng/mL (ref 0.00–0.08)
Troponin i, poc: 0 ng/mL (ref 0.00–0.08)

## 2015-12-05 LAB — LIPASE, BLOOD: LIPASE: 21 U/L (ref 11–51)

## 2015-12-05 LAB — HEPATIC FUNCTION PANEL
ALT: 17 U/L (ref 14–54)
AST: 22 U/L (ref 15–41)
Albumin: 4 g/dL (ref 3.5–5.0)
Alkaline Phosphatase: 101 U/L (ref 38–126)
TOTAL PROTEIN: 8.4 g/dL — AB (ref 6.5–8.1)
Total Bilirubin: 0.9 mg/dL (ref 0.3–1.2)

## 2015-12-05 MED ORDER — FENTANYL CITRATE (PF) 100 MCG/2ML IJ SOLN
50.0000 ug | Freq: Once | INTRAMUSCULAR | Status: AC
  Administered 2015-12-05: 50 ug via INTRAVENOUS
  Filled 2015-12-05: qty 2

## 2015-12-05 MED ORDER — METOCLOPRAMIDE HCL 5 MG/ML IJ SOLN
10.0000 mg | Freq: Once | INTRAMUSCULAR | Status: AC
  Administered 2015-12-05: 10 mg via INTRAVENOUS
  Filled 2015-12-05: qty 2

## 2015-12-05 MED ORDER — SODIUM CHLORIDE 0.9 % IV BOLUS (SEPSIS)
1000.0000 mL | Freq: Once | INTRAVENOUS | Status: AC
  Administered 2015-12-05: 1000 mL via INTRAVENOUS

## 2015-12-05 NOTE — ED Notes (Signed)
Pt is c/o n/v/d since last night. Pt started having chest pain this morning w/ pain of 10/10.  Pt took her CBG and reports it was too high to read on her monitor.

## 2015-12-05 NOTE — ED Notes (Signed)
Pt says that she took 75 units of 70/30 insulin this morning.

## 2015-12-05 NOTE — ED Notes (Signed)
Pt performed a fluid tolerance test. Gave the Pt some water to see if she could keep it down. And Pt reports no nausea currently.

## 2015-12-05 NOTE — ED Triage Notes (Signed)
Pt developed chest pain around 0500 that woke her from sleep, accompanied by nausea and vomiting, sweating, SHOB and pain down left arm.

## 2015-12-05 NOTE — ED Provider Notes (Signed)
[facility] DEPT Provider Note   [name]: [id number] Arrival date & time: 12/05/15  1219     History   Chief Complaint Chief Complaint  Patient presents with  . Chest Pain  . Emesis  . [name]    HPI Deborah [name] is a 50 y.o. female.  The history is provided by the patient.  Chest Pain   This is a new problem. The problem occurs constantly. The pain is present in the substernal region. The quality of the pain is described as pressure-like. The pain radiates to the left shoulder and left arm. Associated symptoms include abdominal pain, diaphoresis, malaise/fatigue, nausea, palpitations, shortness of breath and vomiting. Pertinent negatives include no back pain, no cough, no fever and no lower extremity edema. Risk factors include obesity.  Her past medical history is significant for diabetes, hypertension and strokes.  Pertinent negatives for past medical history include no CAD, no COPD, no hyperlipidemia and no seizures.  Emesis   Associated symptoms include abdominal pain. Pertinent negatives include no arthralgias, no chills, no cough and no fever.  [name]  Associated symptoms include chest pain, vomiting and abdominal pain. Pertinent negatives include no fever, no sore throat, no ear pain, no cough and no rash. Associated medical issues do not include CAD.    Past Medical History:  Diagnosis Date  . Diabetes mellitus   . Fibromyalgia   . Gastric ulcer   . Gastroparesis   . Hypertension   . Pneumonia   . Stroke ([place]) 02/2013  . Stroke [facility])     Patient Active Problem List   Diagnosis Date Noted  . Chest pain 09/26/2015  . Hypokalemia 09/26/2015  . DM (diabetes mellitus), type 2, uncontrolled ([place]) 09/26/2015  . Hypomagnesemia 09/26/2015  . Gastroparesis 09/26/2015  . Gastroparesis 09/21/2015  . Hypokalemia 08/20/2015  . Hypomagnesemia 08/20/2015  . Nausea and vomiting 08/20/2015  . UTI (urinary tract infection) 05/27/2015  .  Gout flare 05/27/2015  . Leukocytosis 05/26/2015  . Nausea with vomiting 05/26/2015  . Abdominal pain 05/26/2015  . [name] 05/26/2015  . DKA (diabetic ketoacidoses) ([place]) 05/25/2015  . Uncontrolled type 2 diabetes mellitus with diabetic neuropathy, with long-term current use of insulin ([place]) 05/25/2015  . Dyslipidemia associated with type 2 diabetes mellitus ([place]) 05/25/2015  . CKD (chronic kidney disease) stage 3, GFR 30-59 ml/min 05/25/2015  . Essential hypertension, benign 09/28/2013    Past Surgical History:  Procedure Laterality Date  . CATARACT EXTRACTION  01/2014  . CHOLECYSTECTOMY      OB History    No data available       Home Medications    Prior to Admission medications   Medication Sig Start Date End Date Taking? Authorizing Provider  albuterol (PROVENTIL HFA;VENTOLIN HFA) 108 (90 Base) MCG/ACT inhaler Inhale 2 puffs into the lungs every 6 (six) hours as needed for wheezing or shortness of breath. 09/23/15  Yes Debbe Odea, MD  allopurinol (ZYLOPRIM) 100 MG tablet Take 1 tablet (100 mg total) by mouth daily. 09/23/15  Yes Debbe Odea, MD  amLODipine (NORVASC) 10 MG tablet Take 1 tablet (10 mg total) by mouth daily. 09/23/15  Yes Debbe Odea, MD  DULoxetine (CYMBALTA) 30 MG capsule Take 1 capsule (30 mg total) by mouth 2 (two) times daily. 09/23/15  Yes Debbe Odea, MD  hydrochlorothiazide (HYDRODIURIL) 25 MG tablet Take 25 mg by mouth daily.   Yes Historical Provider, MD  metoCLOPramide (REGLAN) 10 MG tablet Take 1 tablet (10 mg  total) by mouth 4 (four) times daily -  before meals and at bedtime. Take 30 min prior to eating 09/23/15  Yes Debbe Odea, MD  metoprolol succinate (TOPROL-XL) 25 MG 24 hr tablet Take 1 tablet (25 mg total) by mouth daily. 09/23/15  Yes Debbe Odea, MD  nitroGLYCERIN (NITROSTAT) 0.4 MG SL tablet Place 0.4 mg under the tongue every 5 (five) minutes as needed for chest pain.   Yes Historical Provider, MD  NOVOLOG MIX 70/30 (70-30)  100 UNIT/ML injection Inject 0.5-0.75 mLs (50-75 Units total) into the skin 3 (three) times daily after meals. 75 units every morning, 60 units with lunch, and 50 units with dinner 09/23/15  Yes Debbe Odea, MD  Oxycodone HCl 10 MG TABS Take 10 mg by mouth every 6 (six) hours as needed (severe pain).  09/16/15  Yes Historical Provider, MD  pantoprazole (PROTONIX) 40 MG tablet Take 1 tablet (40 mg total) by mouth 2 (two) times daily. 09/23/15  Yes Debbe Odea, MD  pravastatin (PRAVACHOL) 10 MG tablet Take 1 tablet (10 mg total) by mouth daily. 09/23/15  Yes Debbe Odea, MD  ranitidine (ZANTAC) 150 MG tablet Take 1 tablet (150 mg total) by mouth daily as needed for heartburn. Patient taking differently: Take 150 mg by mouth 3 (three) times daily as needed for heartburn.  09/23/15  Yes Debbe Odea, MD  senna (SENOKOT) 8.6 MG tablet Take 1 tablet (8.6 mg total) by mouth 2 (two) times daily. Patient taking differently: Take 1 tablet by mouth daily as needed for constipation.  09/23/15 09/22/16 Yes Debbe Odea, MD  Vitamin D, Ergocalciferol, (DRISDOL) 50000 units CAPS capsule Take 1 capsule (50,000 Units total) by mouth every 7 (seven) days. Patient taking differently: Take 50,000 Units by mouth every 30 (thirty) days.  09/23/15  Yes Debbe Odea, MD  dicyclomine (BENTYL) 20 MG tablet Take 1 tablet (20 mg total) by mouth every 6 (six) hours as needed. Abdominal cramps Patient not taking: Reported on 12/05/2015 09/23/15   Debbe Odea, MD  sucralfate (CARAFATE) 1 g tablet Take 1 tablet (1 g total) by mouth 4 (four) times daily. Patient not taking: Reported on 12/05/2015 09/23/15   Debbe Odea, MD    Family History Family History  Problem Relation Age of Onset  . Diabetes Mother   . Diabetes Father   . Heart disease Father   . Diabetes Sister   . Diabetes Brother     Social History Social History  Substance Use Topics  . Smoking status: Never Smoker  . Smokeless tobacco: Never Used  . Alcohol use No      Allergies   Lisinopril; Penicillins; Valium [diazepam]; Aspirin; Food; Nsaids; Tylenol [acetaminophen]; and Morphine and related   Review of Systems Review of Systems  Constitutional: Positive for diaphoresis and malaise/fatigue. Negative for chills and fever.  HENT: Negative for ear pain and sore throat.   Eyes: Negative for pain and visual disturbance.  Respiratory: Positive for shortness of breath. Negative for cough.   Cardiovascular: Positive for chest pain and palpitations.  Gastrointestinal: Positive for abdominal pain, nausea and vomiting.  Genitourinary: Negative for dysuria and hematuria.  Musculoskeletal: Negative for arthralgias and back pain.  Skin: Negative for color change and rash.  Neurological: Negative for seizures and syncope.  All other systems reviewed and are negative.    Physical Exam Updated Vital Signs BP 133/100 (BP Location: Right Arm)   Pulse 100   Temp 98.1 F (36.7 C) (Oral)   Resp 18  LMP 10/10/2012   SpO2 99%   Physical Exam  Constitutional: She is oriented to person, place, and time. She appears well-developed and well-nourished. No distress.  HENT:  Head: Normocephalic and atraumatic.  Nose: Nose normal.  Eyes: Conjunctivae and EOM are normal. Pupils are equal, round, and reactive to light. Right eye exhibits no discharge. Left eye exhibits no discharge. No scleral icterus.  Neck: Normal range of motion. Neck supple.  Cardiovascular: Regular rhythm.  Tachycardia present.  Exam reveals no gallop and no friction rub.   No murmur heard. Pulmonary/Chest: Effort normal and breath sounds normal. No stridor. No respiratory distress. She has no rales.  Abdominal: Soft. She exhibits no distension. There is no tenderness.  Musculoskeletal: She exhibits no edema or tenderness.  Neurological: She is alert and oriented to person, place, and time.  Skin: Skin is warm and dry. No rash noted. She is not diaphoretic. No erythema.  Psychiatric:  She has a normal mood and affect.  Vitals reviewed.    ED Treatments / Results  Labs (all labs ordered are listed, but only abnormal results are displayed) Labs Reviewed  BASIC METABOLIC PANEL - Abnormal; Notable for the following:       Result Value   Sodium 133 (*)    Potassium 3.2 (*)    Chloride 98 (*)    CO2 21 (*)    Glucose, Bld 430 (*)    Creatinine, Ser 1.59 (*)    GFR calc non Af Amer 37 (*)    GFR calc Af Amer 42 (*)    All other components within normal limits  CBC - Abnormal; Notable for the following:    WBC 11.1 (*)    RBC 3.86 (*)    Hemoglobin 11.2 (*)    HCT 34.3 (*)    All other components within normal limits  HEPATIC FUNCTION PANEL - Abnormal; Notable for the following:    Total Protein 8.4 (*)    Bilirubin, Direct <0.1 (*)    All other components within normal limits  BLOOD GAS, VENOUS - Abnormal; Notable for the following:    pCO2, Ven 42.3 (*)    Acid-base deficit 2.5 (*)    All other components within normal limits  URINALYSIS, ROUTINE W REFLEX MICROSCOPIC (NOT AT [facility]) - Abnormal; Notable for the following:    APPearance CLOUDY (*)    Glucose, UA >1000 (*)    Hgb urine dipstick TRACE (*)    Protein, ur 30 (*)    All other components within normal limits  URINE MICROSCOPIC-ADD ON - Abnormal; Notable for the following:    Squamous Epithelial / LPF 0-5 (*)    Bacteria, UA FEW (*)    All other components within normal limits  CBG MONITORING, ED - Abnormal; Notable for the following:    Glucose-Capillary 418 (*)    All other components within normal limits  CBG MONITORING, ED - Abnormal; Notable for the following:    Glucose-Capillary 263 (*)    All other components within normal limits  LIPASE, BLOOD  I-STAT TROPOININ, ED  I-STAT TROPOININ, ED    EKG  EKG Interpretation  Date/Time:  Friday December 05 2015 12:27:04 EDT Ventricular Rate:  125 PR Interval:    QRS Duration: 74 QT Interval:  309 QTC Calculation: 446 R  Axis:   -38 Text Interpretation:  Sinus tachycardia Left axis deviation Abnormal R-wave progression, late transition Borderline T abnormalities, anterior leads Baseline wander in lead(s) V2 Artifact Confirmed by Rogene Houston  MD, Nicki Reaper [phone]) on 12/05/2015 12:32:49 PM Also confirmed by Rogene Houston  MD, SCOTT [phone]), editor [place]  CCT, BEVERLY ([id number])  on 12/05/2015 1:56:44 PM       Radiology Dg Chest 2 View  Result Date: 12/05/2015 CLINICAL DATA:  Chest pain with nausea and vomiting today. History of diabetes and hypertension. EXAM: CHEST  2 VIEW COMPARISON:  11/03/2015. FINDINGS: The heart size and mediastinal contours are normal. The lungs are clear. There is no pleural effusion or pneumothorax. No acute osseous findings are identified. Mild degenerative changes are present within thoracic spine. There are probable asymmetric glenohumeral degenerative changes on the right. IMPRESSION: Stable chest.  No active cardiopulmonary process. Electronically Signed   By: Richardean Sale M.D.   On: 12/05/2015 12:49    Procedures Procedures (including critical care time)  Medications Ordered in ED Medications  metoCLOPramide (REGLAN) injection 10 mg (10 mg Intravenous Given 12/05/15 1346)  sodium chloride 0.9 % bolus 1,000 mL (0 mLs Intravenous Stopped 12/05/15 1529)  fentaNYL (SUBLIMAZE) injection 50 mcg (50 mcg Intravenous Given 12/05/15 1353)  sodium chloride 0.9 % bolus 1,000 mL (0 mLs Intravenous Stopped 12/05/15 1701)  fentaNYL (SUBLIMAZE) injection 50 mcg (50 mcg Intravenous Given 12/05/15 1629)     Initial Impression / Assessment and Plan / ED Course  I have reviewed the triage vital signs and the nursing notes.  Pertinent labs & imaging results that were available during my care of the patient were reviewed by me and considered in my medical decision making (see chart for details).  Clinical Course    No evidence of DKA. Urine without evidence of infection. Likely gastroparesis  exacerbation. Evidence of dehydration clinically and confirmed by labs. Patient given several boluses of IV fluids and antibiotics as well as pain medicine.  Chest pain likely secondary to gastroparesis however we'll rule out ACS with serial troponins. EKG without acute ischemia. Chest x-ray without evidence of pneumonia, and with this, pneumomediastinum. No mediastinal abnormal contour. Troponins negative 2. Low suspicion for pulmonary embolism. Presentation not classic for aortic dissection. Emesis is nonbloody and chest x-ray without pneumomediastinum; low concern for perforated esophagus.  The nurse repeat CBG was 263 following IV fluids. Symptomatically patient had significant improvement following treatment. Able to tolerate by mouth she'll ask.  Stable for discharge with strict return precautions.  Final Clinical Impressions(s) / ED Diagnoses   Final diagnoses:  Non-intractable vomiting with nausea, vomiting of unspecified type  Dehydration  [name]  Chest pain, unspecified chest pain type  Hyperglycemia   Disposition: Discharge  Condition: Good  I have discussed the results, Dx and Tx plan with the patient who expressed understanding and agree(s) with the plan. Discharge instructions discussed at great length. The patient was given strict return precautions who verbalized understanding of the instructions. No further questions at time of discharge.     Follow Up: Nolon Bussing, [place] [address] [phone]  Schedule an appointment as soon as possible for a visit in 3 days For close follow up.      [name], MD 12/05/15 [phone]

## 2015-12-06 ENCOUNTER — Inpatient Hospital Stay (HOSPITAL_COMMUNITY)
Admission: EM | Admit: 2015-12-06 | Discharge: 2015-12-10 | DRG: 683 | Disposition: A | Discharge status: Home / Self Care | Payer: Medicaid Other | Attending: Internal Medicine | Admitting: Internal Medicine

## 2015-12-06 ENCOUNTER — Encounter (HOSPITAL_COMMUNITY): Payer: Self-pay | Admitting: *Deleted

## 2015-12-06 DIAGNOSIS — R1030 Lower abdominal pain, unspecified: Secondary | ICD-10-CM | POA: Diagnosis present

## 2015-12-06 DIAGNOSIS — R109 Unspecified abdominal pain: Secondary | ICD-10-CM | POA: Diagnosis not present

## 2015-12-06 DIAGNOSIS — R0789 Other chest pain: Secondary | ICD-10-CM

## 2015-12-06 DIAGNOSIS — E1143 Type 2 diabetes mellitus with diabetic autonomic (poly)neuropathy: Secondary | ICD-10-CM | POA: Diagnosis not present

## 2015-12-06 DIAGNOSIS — Z9111 Patient's noncompliance with dietary regimen: Secondary | ICD-10-CM | POA: Diagnosis not present

## 2015-12-06 DIAGNOSIS — K3184 Gastroparesis: Secondary | ICD-10-CM | POA: Diagnosis not present

## 2015-12-06 DIAGNOSIS — D72829 Elevated white blood cell count, unspecified: Secondary | ICD-10-CM | POA: Diagnosis present

## 2015-12-06 DIAGNOSIS — Z8673 Personal history of transient ischemic attack (TIA), and cerebral infarction without residual deficits: Secondary | ICD-10-CM | POA: Diagnosis not present

## 2015-12-06 DIAGNOSIS — N179 Acute kidney failure, unspecified: Secondary | ICD-10-CM | POA: Diagnosis not present

## 2015-12-06 DIAGNOSIS — I10 Essential (primary) hypertension: Secondary | ICD-10-CM | POA: Diagnosis present

## 2015-12-06 DIAGNOSIS — R112 Nausea with vomiting, unspecified: Secondary | ICD-10-CM | POA: Diagnosis present

## 2015-12-06 DIAGNOSIS — Z833 Family history of diabetes mellitus: Secondary | ICD-10-CM | POA: Diagnosis not present

## 2015-12-06 DIAGNOSIS — E1122 Type 2 diabetes mellitus with diabetic chronic kidney disease: Secondary | ICD-10-CM | POA: Diagnosis present

## 2015-12-06 DIAGNOSIS — E669 Obesity, unspecified: Secondary | ICD-10-CM | POA: Diagnosis not present

## 2015-12-06 DIAGNOSIS — Z6841 Body Mass Index (BMI) 40.0 and over, adult: Secondary | ICD-10-CM | POA: Diagnosis not present

## 2015-12-06 DIAGNOSIS — E876 Hypokalemia: Secondary | ICD-10-CM

## 2015-12-06 DIAGNOSIS — W19XXXA Unspecified fall, initial encounter: Secondary | ICD-10-CM

## 2015-12-06 DIAGNOSIS — K5900 Constipation, unspecified: Secondary | ICD-10-CM | POA: Diagnosis not present

## 2015-12-06 DIAGNOSIS — R739 Hyperglycemia, unspecified: Secondary | ICD-10-CM

## 2015-12-06 DIAGNOSIS — K219 Gastro-esophageal reflux disease without esophagitis: Secondary | ICD-10-CM | POA: Diagnosis not present

## 2015-12-06 DIAGNOSIS — N182 Chronic kidney disease, stage 2 (mild): Secondary | ICD-10-CM

## 2015-12-06 DIAGNOSIS — Z8249 Family history of ischemic heart disease and other diseases of the circulatory system: Secondary | ICD-10-CM | POA: Diagnosis not present

## 2015-12-06 DIAGNOSIS — N189 Chronic kidney disease, unspecified: Secondary | ICD-10-CM | POA: Diagnosis not present

## 2015-12-06 DIAGNOSIS — Z8711 Personal history of peptic ulcer disease: Secondary | ICD-10-CM

## 2015-12-06 DIAGNOSIS — I129 Hypertensive chronic kidney disease with stage 1 through stage 4 chronic kidney disease, or unspecified chronic kidney disease: Secondary | ICD-10-CM | POA: Diagnosis present

## 2015-12-06 DIAGNOSIS — E1165 Type 2 diabetes mellitus with hyperglycemia: Secondary | ICD-10-CM | POA: Diagnosis present

## 2015-12-06 DIAGNOSIS — Z794 Long term (current) use of insulin: Secondary | ICD-10-CM | POA: Diagnosis not present

## 2015-12-06 DIAGNOSIS — M797 Fibromyalgia: Secondary | ICD-10-CM | POA: Diagnosis present

## 2015-12-06 LAB — MAGNESIUM: MAGNESIUM: 1.1 mg/dL — AB (ref 1.7–2.4)

## 2015-12-06 LAB — CBC WITH DIFFERENTIAL/PLATELET
BASOS PCT: 0 %
Basophils Absolute: 0 10*3/uL (ref 0.0–0.1)
EOS ABS: 0.2 10*3/uL (ref 0.0–0.7)
EOS PCT: 2 %
HCT: 34.7 % — ABNORMAL LOW (ref 36.0–46.0)
Hemoglobin: 11.4 g/dL — ABNORMAL LOW (ref 12.0–15.0)
Lymphocytes Relative: 24 %
Lymphs Abs: 2.9 10*3/uL (ref 0.7–4.0)
MCH: 29.2 pg (ref 26.0–34.0)
MCHC: 32.9 g/dL (ref 30.0–36.0)
MCV: 89 fL (ref 78.0–100.0)
MONO ABS: 0.7 10*3/uL (ref 0.1–1.0)
MONOS PCT: 5 %
Neutro Abs: 8.2 10*3/uL — ABNORMAL HIGH (ref 1.7–7.7)
Neutrophils Relative %: 69 %
Platelets: 364 10*3/uL (ref 150–400)
RBC: 3.9 MIL/uL (ref 3.87–5.11)
RDW: 13.9 % (ref 11.5–15.5)
WBC: 12 10*3/uL — ABNORMAL HIGH (ref 4.0–10.5)

## 2015-12-06 LAB — I-STAT TROPONIN, ED: TROPONIN I, POC: 0.01 ng/mL (ref 0.00–0.08)

## 2015-12-06 LAB — COMPREHENSIVE METABOLIC PANEL
ALBUMIN: 3.9 g/dL (ref 3.5–5.0)
ALT: 19 U/L (ref 14–54)
ANION GAP: 11 (ref 5–15)
AST: 21 U/L (ref 15–41)
Alkaline Phosphatase: 102 U/L (ref 38–126)
BUN: 15 mg/dL (ref 6–20)
CO2: 25 mmol/L (ref 22–32)
Calcium: 9.3 mg/dL (ref 8.9–10.3)
Chloride: 99 mmol/L — ABNORMAL LOW (ref 101–111)
Creatinine, Ser: 1.37 mg/dL — ABNORMAL HIGH (ref 0.44–1.00)
GFR calc Af Amer: 51 mL/min — ABNORMAL LOW (ref >60)
GFR calc non Af Amer: 44 mL/min — ABNORMAL LOW (ref >60)
GLUCOSE: 413 mg/dL — AB (ref 65–99)
POTASSIUM: 3 mmol/L — AB (ref 3.5–5.1)
SODIUM: 135 mmol/L (ref 135–145)
TOTAL PROTEIN: 8.4 g/dL — AB (ref 6.5–8.1)
Total Bilirubin: 0.7 mg/dL (ref 0.3–1.2)

## 2015-12-06 LAB — CBG MONITORING, ED
GLUCOSE-CAPILLARY: 282 mg/dL — AB (ref 65–99)
Glucose-Capillary: 400 mg/dL — ABNORMAL HIGH (ref 65–99)

## 2015-12-06 LAB — GLUCOSE, CAPILLARY: GLUCOSE-CAPILLARY: 301 mg/dL — AB (ref 65–99)

## 2015-12-06 LAB — TROPONIN I

## 2015-12-06 LAB — LIPASE, BLOOD: Lipase: 43 U/L (ref 11–51)

## 2015-12-06 MED ORDER — METOPROLOL SUCCINATE ER 25 MG PO TB24
25.0000 mg | ORAL_TABLET | Freq: Every day | ORAL | Status: DC
  Administered 2015-12-06 – 2015-12-10 (×5): 25 mg via ORAL
  Filled 2015-12-06 (×5): qty 1

## 2015-12-06 MED ORDER — DULOXETINE HCL 30 MG PO CPEP
30.0000 mg | ORAL_CAPSULE | Freq: Two times a day (BID) | ORAL | Status: DC
  Administered 2015-12-06 – 2015-12-10 (×8): 30 mg via ORAL
  Filled 2015-12-06 (×8): qty 1

## 2015-12-06 MED ORDER — NITROGLYCERIN 0.4 MG SL SUBL: 0.4000 mg | SUBLINGUAL_TABLET | SUBLINGUAL | Status: DC | PRN

## 2015-12-06 MED ORDER — POTASSIUM CHLORIDE 10 MEQ/100ML IV SOLN
10.0000 meq | Freq: Once | INTRAVENOUS | Status: AC
  Administered 2015-12-06: 10 meq via INTRAVENOUS
  Filled 2015-12-06: qty 100

## 2015-12-06 MED ORDER — INSULIN ASPART 100 UNIT/ML ~~LOC~~ SOLN
0.0000 [IU] | Freq: Three times a day (TID) | SUBCUTANEOUS | Status: DC
  Administered 2015-12-07: 11 [IU] via SUBCUTANEOUS
  Administered 2015-12-07: 20 [IU] via SUBCUTANEOUS
  Administered 2015-12-07: 4 [IU] via SUBCUTANEOUS
  Administered 2015-12-08: 7 [IU] via SUBCUTANEOUS
  Administered 2015-12-08 (×2): 4 [IU] via SUBCUTANEOUS
  Administered 2015-12-09: 3 [IU] via SUBCUTANEOUS
  Administered 2015-12-09 (×2): 4 [IU] via SUBCUTANEOUS
  Administered 2015-12-10: 3 [IU] via SUBCUTANEOUS
  Administered 2015-12-10: 4 [IU] via SUBCUTANEOUS

## 2015-12-06 MED ORDER — INSULIN ASPART 100 UNIT/ML ~~LOC~~ SOLN
10.0000 [IU] | Freq: Once | SUBCUTANEOUS | Status: AC
  Administered 2015-12-06: 10 [IU] via INTRAVENOUS
  Filled 2015-12-06: qty 1

## 2015-12-06 MED ORDER — INSULIN ASPART PROT & ASPART (70-30 MIX) 100 UNIT/ML ~~LOC~~ SUSP
50.0000 [IU] | Freq: Two times a day (BID) | SUBCUTANEOUS | Status: DC
  Administered 2015-12-07 – 2015-12-10 (×7): 50 [IU] via SUBCUTANEOUS
  Filled 2015-12-06 (×2): qty 10

## 2015-12-06 MED ORDER — PRAVASTATIN SODIUM 20 MG PO TABS
10.0000 mg | ORAL_TABLET | Freq: Every day | ORAL | Status: DC
  Administered 2015-12-06 – 2015-12-10 (×5): 10 mg via ORAL
  Filled 2015-12-06 (×5): qty 1

## 2015-12-06 MED ORDER — ALLOPURINOL 100 MG PO TABS
100.0000 mg | ORAL_TABLET | Freq: Every day | ORAL | Status: DC
  Administered 2015-12-06 – 2015-12-10 (×5): 100 mg via ORAL
  Filled 2015-12-06 (×5): qty 1

## 2015-12-06 MED ORDER — HALOPERIDOL LACTATE 5 MG/ML IJ SOLN
5.0000 mg | Freq: Once | INTRAMUSCULAR | Status: AC
  Administered 2015-12-06: 5 mg via INTRAVENOUS
  Filled 2015-12-06: qty 1

## 2015-12-06 MED ORDER — OXYCODONE HCL 5 MG PO TABS
5.0000 mg | ORAL_TABLET | ORAL | Status: DC | PRN
Start: 2015-12-06 — End: 2015-12-10
  Administered 2015-12-07 – 2015-12-10 (×6): 5 mg via ORAL
  Filled 2015-12-06 (×6): qty 1

## 2015-12-06 MED ORDER — HYDROMORPHONE HCL 1 MG/ML IJ SOLN
1.0000 mg | INTRAMUSCULAR | Status: DC | PRN
  Administered 2015-12-06 – 2015-12-10 (×17): 1 mg via INTRAVENOUS
  Filled 2015-12-06 (×20): qty 1

## 2015-12-06 MED ORDER — POTASSIUM CHLORIDE IN NACL 40-0.9 MEQ/L-% IV SOLN
INTRAVENOUS | Status: DC
  Administered 2015-12-06 – 2015-12-09 (×5): 100 mL/h via INTRAVENOUS
  Filled 2015-12-06 (×6): qty 1000

## 2015-12-06 MED ORDER — METOCLOPRAMIDE HCL 5 MG/ML IJ SOLN
10.0000 mg | Freq: Four times a day (QID) | INTRAMUSCULAR | Status: DC
  Administered 2015-12-06 – 2015-12-10 (×14): 10 mg via INTRAVENOUS
  Filled 2015-12-06 (×14): qty 2

## 2015-12-06 MED ORDER — SODIUM CHLORIDE 0.9 % IV SOLN
INTRAVENOUS | Status: DC
  Administered 2015-12-06: 100 mL/h via INTRAVENOUS

## 2015-12-06 MED ORDER — PANTOPRAZOLE SODIUM 40 MG PO TBEC
40.0000 mg | DELAYED_RELEASE_TABLET | Freq: Two times a day (BID) | ORAL | Status: DC
  Administered 2015-12-06 – 2015-12-10 (×8): 40 mg via ORAL
  Filled 2015-12-06 (×8): qty 1

## 2015-12-06 MED ORDER — SODIUM CHLORIDE 0.9% FLUSH
3.0000 mL | Freq: Two times a day (BID) | INTRAVENOUS | Status: DC
  Administered 2015-12-08 – 2015-12-09 (×2): 3 mL via INTRAVENOUS

## 2015-12-06 MED ORDER — ONDANSETRON 4 MG PO TBDP
4.0000 mg | ORAL_TABLET | Freq: Once | ORAL | Status: AC | PRN
  Administered 2015-12-06: 4 mg via ORAL
  Filled 2015-12-06: qty 1

## 2015-12-06 MED ORDER — DIPHENHYDRAMINE HCL 50 MG/ML IJ SOLN
25.0000 mg | Freq: Once | INTRAMUSCULAR | Status: AC
  Administered 2015-12-06: 25 mg via INTRAVENOUS
  Filled 2015-12-06: qty 1

## 2015-12-06 MED ORDER — SODIUM CHLORIDE 0.9 % IV BOLUS (SEPSIS)
1000.0000 mL | Freq: Once | INTRAVENOUS | Status: AC
  Administered 2015-12-06: 1000 mL via INTRAVENOUS

## 2015-12-06 MED ORDER — ONDANSETRON HCL 4 MG/2ML IJ SOLN
4.0000 mg | Freq: Four times a day (QID) | INTRAMUSCULAR | Status: DC | PRN
  Administered 2015-12-07: 4 mg via INTRAVENOUS
  Filled 2015-12-06: qty 2

## 2015-12-06 MED ORDER — ALBUTEROL SULFATE (2.5 MG/3ML) 0.083% IN NEBU: 2.5000 mg | INHALATION_SOLUTION | RESPIRATORY_TRACT | Status: DC | PRN

## 2015-12-06 MED ORDER — ONDANSETRON HCL 4 MG/2ML IJ SOLN
4.0000 mg | Freq: Once | INTRAMUSCULAR | Status: AC
  Administered 2015-12-06: 4 mg via INTRAVENOUS
  Filled 2015-12-06: qty 2

## 2015-12-06 MED ORDER — METOCLOPRAMIDE HCL 5 MG/ML IJ SOLN
10.0000 mg | Freq: Once | INTRAMUSCULAR | Status: AC
  Administered 2015-12-06: 10 mg via INTRAVENOUS
  Filled 2015-12-06: qty 2

## 2015-12-06 MED ORDER — INSULIN ASPART 100 UNIT/ML ~~LOC~~ SOLN
0.0000 [IU] | Freq: Every day | SUBCUTANEOUS | Status: DC
  Administered 2015-12-06: 4 [IU] via SUBCUTANEOUS

## 2015-12-06 MED ORDER — ONDANSETRON HCL 4 MG PO TABS: 4.0000 mg | ORAL_TABLET | Freq: Four times a day (QID) | ORAL | Status: DC | PRN

## 2015-12-06 MED ORDER — HYDROMORPHONE HCL 1 MG/ML IJ SOLN
1.0000 mg | Freq: Once | INTRAMUSCULAR | Status: AC
  Administered 2015-12-06: 1 mg via INTRAVENOUS
  Filled 2015-12-06: qty 1

## 2015-12-06 MED ORDER — AMLODIPINE BESYLATE 10 MG PO TABS
10.0000 mg | ORAL_TABLET | Freq: Every day | ORAL | Status: DC
  Administered 2015-12-06 – 2015-12-10 (×5): 10 mg via ORAL
  Filled 2015-12-06 (×5): qty 1

## 2015-12-06 NOTE — ED Triage Notes (Signed)
Pt complains of chest pain radiating to left arm, nausea, vomiting, SOB since yesterday. Pt was seen yesterday at [facility] and states she feels the same since being discharged.

## 2015-12-06 NOTE — H&P (Signed)
History and Physical    Deborah [name] XFG:[id number] DOB: [birth year] DOA: 12/06/2015  PCP: Nolon Bussing, PA  Patient tells me that she is followed in a clinic for Medicaid patients in [place]; she denies having a PCP  Patient coming from: Home  Chief Complaint: Chest pain, abdominal pain, nausea, vomiting  HPI: Deborah [name] is a 50 y.o. woman with a history of uncontrolled IDDM diabetes complicated by CKD and gastroparesis, prior CVA, HTN, and fibromyalgia who presents to the ED for the second day in a row complaining of recurrent chest and abdominal pain, which she attributes to a "flare up" of her gastroparesis, although today she complained of the pain radiating to her left arm and associated shortness of breath.  She has had nausea, vomiting, and intermittent diarrhea, which she says is also typical.  Per the ED attending, she has recurrent presentations to the ED with similar complaints.  ED Course: EKG yesterday and today showed sinus tachycardia without acute ST segment changes.  I-stat troponin yesterday and today negative.  The patient has a leukocytosis but no apparent source of infection.  U/A yesterday not consistent with acute infection.  Chest xray yesterday negative for acute process.  Normal lipase, normal LFTs.  The patient is hyperglycemic but she is not in DKA.  She has evidence of AKI.  Potassium level 3; magnesium level pending.  The patient received multiple medications in the ED, including IV benadryl, haldol, reglan, zofran, and dilaudid.    She still has nausea.  She thinks that she can eat.  She is still complaining of chest and lower abdominal pain.  Review of Systems: As per HPI otherwise 10 point review of systems negative.    Past Medical History:  Diagnosis Date  . Diabetes mellitus   . Fibromyalgia   . Gastric ulcer   . Gastroparesis   . Hypertension   . Pneumonia   . Stroke ([place]) 02/2013  . Stroke [facility])     Past Surgical  History:  Procedure Laterality Date  . CATARACT EXTRACTION  01/2014  . CHOLECYSTECTOMY       reports that she has never smoked. She has never used smokeless tobacco. She reports that she does not drink alcohol or use drugs.  She is not married.  She does not have children.  She considers her mother her next of kin/emergency contact.     Allergies  Allergen Reactions  . Lisinopril Anaphylaxis    Tongue and mouth swelling  . Penicillins Palpitations    Has patient had a PCN reaction causing immediate rash, facial/tongue/throat swelling, SOB or lightheadedness with hypotension: Yes Has patient had a PCN reaction causing severe rash involving mucus membranes or skin necrosis: No Has patient had a PCN reaction that required hospitalization: Yes  Has patient had a PCN reaction occurring within the last 10 years: No   . Valium [Diazepam] [name]  . Aspirin Other (See Comments)    Irritates stomach ulcer   . Food Hives and Swelling    Carrots, ketchup   . Nsaids Other (See Comments)    ULCER  . Tylenol [Acetaminophen] Other (See Comments)    Irritates stomach ulcer   . Morphine And Related Palpitations    Family History  Problem Relation Age of Onset  . Diabetes Mother   . Diabetes Father   . Heart disease Father   . Diabetes Sister   . Diabetes Brother      Prior to Admission medications  Medication Sig Start Date End Date Taking? Authorizing Provider  albuterol (PROVENTIL HFA;VENTOLIN HFA) 108 (90 Base) MCG/ACT inhaler Inhale 2 puffs into the lungs every 6 (six) hours as needed for wheezing or shortness of breath. 09/23/15  Yes Debbe Odea, MD  allopurinol (ZYLOPRIM) 100 MG tablet Take 1 tablet (100 mg total) by mouth daily. 09/23/15  Yes Debbe Odea, MD  amLODipine (NORVASC) 10 MG tablet Take 1 tablet (10 mg total) by mouth daily. 09/23/15  Yes Debbe Odea, MD  DULoxetine (CYMBALTA) 30 MG capsule Take 1 capsule (30 mg total) by mouth 2 (two) times daily. 09/23/15   Yes Debbe Odea, MD  hydrochlorothiazide (HYDRODIURIL) 25 MG tablet Take 25 mg by mouth daily.   Yes Historical Provider, MD  metoCLOPramide (REGLAN) 10 MG tablet Take 1 tablet (10 mg total) by mouth 4 (four) times daily -  before meals and at bedtime. Take 30 min prior to eating 09/23/15  Yes Debbe Odea, MD  metoprolol succinate (TOPROL-XL) 25 MG 24 hr tablet Take 1 tablet (25 mg total) by mouth daily. 09/23/15  Yes Debbe Odea, MD  nitroGLYCERIN (NITROSTAT) 0.4 MG SL tablet Place 0.4 mg under the tongue every 5 (five) minutes as needed for chest pain.   Yes Historical Provider, MD  NOVOLOG MIX 70/30 (70-30) 100 UNIT/ML injection Inject 0.5-0.75 mLs (50-75 Units total) into the skin 3 (three) times daily after meals. 75 units every morning, 60 units with lunch, and 50 units with dinner 09/23/15  Yes Debbe Odea, MD  Oxycodone HCl 10 MG TABS Take 10 mg by mouth every 6 (six) hours as needed (severe pain).  09/16/15  Yes Historical Provider, MD  pantoprazole (PROTONIX) 40 MG tablet Take 1 tablet (40 mg total) by mouth 2 (two) times daily. 09/23/15  Yes Debbe Odea, MD  pravastatin (PRAVACHOL) 10 MG tablet Take 1 tablet (10 mg total) by mouth daily. 09/23/15  Yes Debbe Odea, MD  ranitidine (ZANTAC) 150 MG tablet Take 1 tablet (150 mg total) by mouth daily as needed for heartburn. Patient taking differently: Take 150 mg by mouth 3 (three) times daily as needed for heartburn.  09/23/15  Yes Debbe Odea, MD  senna (SENOKOT) 8.6 MG tablet Take 1 tablet (8.6 mg total) by mouth 2 (two) times daily. Patient taking differently: Take 1 tablet by mouth daily as needed for constipation.  09/23/15 09/22/16 Yes Debbe Odea, MD  Vitamin D, Ergocalciferol, (DRISDOL) 50000 units CAPS capsule Take 1 capsule (50,000 Units total) by mouth every 7 (seven) days. Patient taking differently: Take 50,000 Units by mouth every 30 (thirty) days.  09/23/15  Yes Debbe Odea, MD    Physical Exam: Vitals:   12/06/15 2000  12/06/15 2030 12/06/15 2108 12/06/15 2113  BP: 132/77 126/78  (!) 147/129  Pulse: 96 95  98  Resp: 23 16  16   Temp:    98.2 F (36.8 C)  TempSrc:    Oral  SpO2: 98% 95%  99%  Height:   5\' 6"  (1.676 m)       Constitutional: NAD, calm, comfortable, NONtoxic appearing, sitting on the edge of the bed Vitals:   12/06/15 2000 12/06/15 2030 12/06/15 2108 12/06/15 2113  BP: 132/77 126/78  (!) 147/129  Pulse: 96 95  98  Resp: 23 16  16   Temp:    98.2 F (36.8 C)  TempSrc:    Oral  SpO2: 98% 95%  99%  Height:   5\' 6"  (1.676 m)    Eyes: PERRL, lids and  conjunctivae normal ENMT: Mucous membranes are moist. Posterior pharynx clear of any exudate or lesions. Normal dentition.  Neck: normal appearance, supple Respiratory: clear to auscultation bilaterally, no wheezing, no crackles. Normal respiratory effort. No accessory muscle use.  Cardiovascular: Normal rate, regular rhythm, no murmurs / rubs / gallops. No extremity edema. 2+ pedal pulses. GI: abdomen is super obese and protuberant.  No guarding.  No tenderness.  Bowel sounds are present. Musculoskeletal:  No joint deformity in upper and lower extremities. Good ROM, no contractures. Normal muscle tone.  Skin: no rashes, warm and dry Neurologic: No focal deficits. Psychiatric: Normal judgment and insight. Alert and oriented x 3. Normal mood.     Labs on Admission: I have personally reviewed following labs and imaging studies  CBC:  Recent Labs Lab 12/05/15 1258 12/06/15 1608  WBC 11.1* 12.0*  NEUTROABS  --  8.2*  HGB 11.2* 11.4*  HCT 34.3* 34.7*  MCV 88.9 89.0  PLT 351 811   Basic Metabolic Panel:  Recent Labs Lab 12/05/15 1258 12/06/15 1608  NA 133* 135  K 3.2* 3.0*  CL 98* 99*  CO2 21* 25  GLUCOSE 430* 413*  BUN 17 15  CREATININE 1.59* 1.37*  CALCIUM 9.3 9.3   GFR: CrCl cannot be calculated (Unknown ideal weight.). Liver Function Tests:  Recent Labs Lab 12/05/15 1258 12/06/15 1608  AST 22 21  ALT 17  19  ALKPHOS 101 102  BILITOT 0.9 0.7  PROT 8.4* 8.4*  ALBUMIN 4.0 3.9    Recent Labs Lab 12/05/15 1258 12/06/15 1608  LIPASE 21 43   CBG:  Recent Labs Lab 12/05/15 1345 12/05/15 1657 12/06/15 1441 12/06/15 1829  GLUCAP 418* 263* 400* 282*   Urine analysis:    Component Value Date/Time   COLORURINE YELLOW 12/05/2015 1518   APPEARANCEUR CLOUDY (A) 12/05/2015 1518   LABSPEC 1.027 12/05/2015 1518   PHURINE 5.5 12/05/2015 1518   GLUCOSEU >1000 (A) 12/05/2015 1518   HGBUR TRACE (A) 12/05/2015 1518   BILIRUBINUR NEGATIVE 12/05/2015 1518   KETONESUR NEGATIVE 12/05/2015 1518   PROTEINUR 30 (A) 12/05/2015 1518   UROBILINOGEN 0.2 10/02/2013 2108   NITRITE NEGATIVE 12/05/2015 1518   LEUKOCYTESUR NEGATIVE 12/05/2015 1518    Radiological Exams on Admission: Dg Chest 2 View  Result Date: 12/05/2015 CLINICAL DATA:  Chest pain with nausea and vomiting today. History of diabetes and hypertension. EXAM: CHEST  2 VIEW COMPARISON:  11/03/2015. FINDINGS: The heart size and mediastinal contours are normal. The lungs are clear. There is no pleural effusion or pneumothorax. No acute osseous findings are identified. Mild degenerative changes are present within thoracic spine. There are probable asymmetric glenohumeral degenerative changes on the right. IMPRESSION: Stable chest.  No active cardiopulmonary process. Electronically Signed   By: Richardean Sale M.D.   On: 12/05/2015 12:49    EKG: Independently reviewed. Noted above.  Assessment/Plan Principal Problem:   Gastroparesis Active Problems:   Essential hypertension, benign   Leukocytosis   Nausea with vomiting   Abdominal pain   Hypokalemia   DM (diabetes mellitus), type 2, uncontrolled (HCC)   AKI (acute kidney injury) ([place])      Atypical chest and abdominal pain, thought to be secondary to acute exacerbation of gastroparesis.  However, she has multiple cardiac risk factors, and I do not see history of stress test or recent  echo in this system. --Admit to telemetry --Serial troponin.  If negative, consider referral for ischemic evaluation, even if done as outpatient. --Analgesics and anti-emetics as  needed.  Scheduled IV Reglan. --Try full liquid diet, advance as tolerated  DM --Modify 70/30 dosing to 50 units BID with meals for now (while on modified diet) plus aggressive sliding scale AC/HS --Check A1c  AKI --NS bolus one time now then NS with 40 mEq of KCl at 100 cc/hr --Repeat labs in the AM  Hypokalemia --Replacement ordered  HTN --HOLD HCTZ, continue metoprolol and amlodipine  History of GERD, gastric ulcer --Continue protonix BID   DVT prophylaxis: SCDs Code Status: FULL Family Communication: Patient alone at time of admission. Disposition Plan: To be determined. Consults called: None Admission status: Place in observation with telemetry   TIME SPENT: 87 minutes   Eber Jones MD [facility] Pager [phone]  If 7PM-7AM, please contact night-coverage [email or web address] Password [facility]  12/06/2015, 9:33 PM

## 2015-12-06 NOTE — ED Provider Notes (Signed)
[place] DEPT Provider Note   [name]: [id number] Arrival date & time: 12/06/15  1309     History   Chief Complaint Chief Complaint  Patient presents with  . Chest Pain  . Emesis    HPI Deborah [name] is a 50 y.o. female.  Patient seen yesterday in the emergency department for similar complaints for today. Patient with complaint of substernal chest pain started again last night at 9:00 persistent nausea vomiting and generalized abdominal pain. Patient feels is all related to her gastroparesis. Patient had 2 troponin just today that were negative received IV fluids received insulin and blood sugar came down from 400 to the 200 range. Patient's electrolytes without evidence of metabolic acidosis or significant potassium abnormality. Patient seen frequently last admitted for gastroparesis the end of July.      Past Medical History:  Diagnosis Date  . Diabetes mellitus   . Fibromyalgia   . Gastric ulcer   . Gastroparesis   . Hypertension   . Pneumonia   . Stroke ([place]) 02/2013  . Stroke [facility])     Patient Active Problem List   Diagnosis Date Noted  . Chest pain 09/26/2015  . Hypokalemia 09/26/2015  . DM (diabetes mellitus), type 2, uncontrolled ([place]) 09/26/2015  . Hypomagnesemia 09/26/2015  . Gastroparesis 09/26/2015  . Gastroparesis 09/21/2015  . Hypokalemia 08/20/2015  . Hypomagnesemia 08/20/2015  . Nausea and vomiting 08/20/2015  . UTI (urinary tract infection) 05/27/2015  . Gout flare 05/27/2015  . Leukocytosis 05/26/2015  . Nausea with vomiting 05/26/2015  . Abdominal pain 05/26/2015  . [name] 05/26/2015  . DKA (diabetic ketoacidoses) ([place]) 05/25/2015  . Uncontrolled type 2 diabetes mellitus with diabetic neuropathy, with long-term current use of insulin ([place]) 05/25/2015  . Dyslipidemia associated with type 2 diabetes mellitus ([place]) 05/25/2015  . CKD (chronic kidney disease) stage 3, GFR 30-59 ml/min 05/25/2015  . Essential hypertension,  benign 09/28/2013    Past Surgical History:  Procedure Laterality Date  . CATARACT EXTRACTION  01/2014  . CHOLECYSTECTOMY      OB History    No data available       Home Medications    Prior to Admission medications   Medication Sig Start Date End Date Taking? Authorizing Provider  albuterol (PROVENTIL HFA;VENTOLIN HFA) 108 (90 Base) MCG/ACT inhaler Inhale 2 puffs into the lungs every 6 (six) hours as needed for wheezing or shortness of breath. 09/23/15  Yes Debbe Odea, MD  allopurinol (ZYLOPRIM) 100 MG tablet Take 1 tablet (100 mg total) by mouth daily. 09/23/15  Yes Debbe Odea, MD  amLODipine (NORVASC) 10 MG tablet Take 1 tablet (10 mg total) by mouth daily. 09/23/15  Yes Debbe Odea, MD  DULoxetine (CYMBALTA) 30 MG capsule Take 1 capsule (30 mg total) by mouth 2 (two) times daily. 09/23/15  Yes Debbe Odea, MD  hydrochlorothiazide (HYDRODIURIL) 25 MG tablet Take 25 mg by mouth daily.   Yes Historical Provider, MD  metoCLOPramide (REGLAN) 10 MG tablet Take 1 tablet (10 mg total) by mouth 4 (four) times daily -  before meals and at bedtime. Take 30 min prior to eating 09/23/15  Yes Debbe Odea, MD  metoprolol succinate (TOPROL-XL) 25 MG 24 hr tablet Take 1 tablet (25 mg total) by mouth daily. 09/23/15  Yes Debbe Odea, MD  nitroGLYCERIN (NITROSTAT) 0.4 MG SL tablet Place 0.4 mg under the tongue every 5 (five) minutes as needed for chest pain.   Yes Historical Provider, MD  NOVOLOG MIX 70/30 (70-30) 100  UNIT/ML injection Inject 0.5-0.75 mLs (50-75 Units total) into the skin 3 (three) times daily after meals. 75 units every morning, 60 units with lunch, and 50 units with dinner 09/23/15  Yes Debbe Odea, MD  Oxycodone HCl 10 MG TABS Take 10 mg by mouth every 6 (six) hours as needed (severe pain).  09/16/15  Yes Historical Provider, MD  pantoprazole (PROTONIX) 40 MG tablet Take 1 tablet (40 mg total) by mouth 2 (two) times daily. 09/23/15  Yes Debbe Odea, MD  pravastatin (PRAVACHOL) 10  MG tablet Take 1 tablet (10 mg total) by mouth daily. 09/23/15  Yes Debbe Odea, MD  ranitidine (ZANTAC) 150 MG tablet Take 1 tablet (150 mg total) by mouth daily as needed for heartburn. Patient taking differently: Take 150 mg by mouth 3 (three) times daily as needed for heartburn.  09/23/15  Yes Debbe Odea, MD  senna (SENOKOT) 8.6 MG tablet Take 1 tablet (8.6 mg total) by mouth 2 (two) times daily. Patient taking differently: Take 1 tablet by mouth daily as needed for constipation.  09/23/15 09/22/16 Yes Debbe Odea, MD  Vitamin D, Ergocalciferol, (DRISDOL) 50000 units CAPS capsule Take 1 capsule (50,000 Units total) by mouth every 7 (seven) days. Patient taking differently: Take 50,000 Units by mouth every 30 (thirty) days.  09/23/15  Yes Debbe Odea, MD  dicyclomine (BENTYL) 20 MG tablet Take 1 tablet (20 mg total) by mouth every 6 (six) hours as needed. Abdominal cramps Patient not taking: Reported on 12/06/2015 09/23/15   Debbe Odea, MD  sucralfate (CARAFATE) 1 g tablet Take 1 tablet (1 g total) by mouth 4 (four) times daily. Patient not taking: Reported on 12/06/2015 09/23/15   Debbe Odea, MD    Family History Family History  Problem Relation Age of Onset  . Diabetes Mother   . Diabetes Father   . Heart disease Father   . Diabetes Sister   . Diabetes Brother     Social History Social History  Substance Use Topics  . Smoking status: Never Smoker  . Smokeless tobacco: Never Used  . Alcohol use No     Allergies   Lisinopril; Penicillins; Valium [diazepam]; Aspirin; Food; Nsaids; Tylenol [acetaminophen]; and Morphine and related   Review of Systems Review of Systems  Constitutional: Negative for fever.  HENT: Negative for congestion.   Eyes: Negative for visual disturbance.  Respiratory: Negative for shortness of breath.   Cardiovascular: Positive for chest pain.  Gastrointestinal: Positive for abdominal pain, nausea and vomiting.  Genitourinary: Negative for dysuria.    Musculoskeletal: Negative for back pain.  Neurological: Negative for headaches.  Hematological: Does not bruise/bleed easily.  Psychiatric/Behavioral: Negative for confusion.     Physical Exam Updated Vital Signs BP 135/88   Pulse 97   Temp 97.6 F (36.4 C) (Oral)   Resp 18   LMP 10/10/2012   SpO2 95%   Physical Exam  Constitutional: She is oriented to person, place, and time. She appears well-developed and well-nourished. No distress.  HENT:  Head: Normocephalic and atraumatic.  Mucous membranes dry  Eyes: EOM are normal. Pupils are equal, round, and reactive to light.  Neck: Normal range of motion. Neck supple.  Cardiovascular: Normal rate, regular rhythm and normal heart sounds.   No murmur heard. Pulmonary/Chest: Effort normal and breath sounds normal. No respiratory distress.  Abdominal: Soft. Bowel sounds are normal. There is no tenderness.  Musculoskeletal: Normal range of motion.  Neurological: She is alert and oriented to person, place, and time. No cranial  nerve deficit. She exhibits normal muscle tone. Coordination normal.  Skin: Skin is warm.  Nursing note and vitals reviewed.    ED Treatments / Results  Labs (all labs ordered are listed, but only abnormal results are displayed) Labs Reviewed  CBC WITH DIFFERENTIAL/PLATELET - Abnormal; Notable for the following:       Result Value   WBC 12.0 (*)    Hemoglobin 11.4 (*)    HCT 34.7 (*)    Neutro Abs 8.2 (*)    All other components within normal limits  COMPREHENSIVE METABOLIC PANEL - Abnormal; Notable for the following:    Potassium 3.0 (*)    Chloride 99 (*)    Glucose, Bld 413 (*)    Creatinine, Ser 1.37 (*)    Total Protein 8.4 (*)    GFR calc non Af Amer 44 (*)    GFR calc Af Amer 51 (*)    All other components within normal limits  CBG MONITORING, ED - Abnormal; Notable for the following:    Glucose-Capillary 400 (*)    All other components within normal limits  CBG MONITORING, ED - Abnormal;  Notable for the following:    Glucose-Capillary 282 (*)    All other components within normal limits  LIPASE, BLOOD  MAGNESIUM  I-STAT TROPOININ, ED    EKG  EKG Interpretation  Date/Time:  Saturday December 06 2015 13:38:02 EDT Ventricular Rate:  121 PR Interval:    QRS Duration: 76 QT Interval:  321 QTC Calculation: 456 R Axis:   -27 Text Interpretation:  Sinus tachycardia Ventricular premature complex Borderline left axis deviation Low voltage, precordial leads RSR' in V1 or V2, probably normal variant Artifact No significant change since last tracing Confirmed by Athens Lebeau  MD, Jaylinn Hellenbrand [phone]) on 12/06/2015 3:01:45 PM       Radiology Dg Chest 2 View  Result Date: 12/05/2015 CLINICAL DATA:  Chest pain with nausea and vomiting today. History of diabetes and hypertension. EXAM: CHEST  2 VIEW COMPARISON:  11/03/2015. FINDINGS: The heart size and mediastinal contours are normal. The lungs are clear. There is no pleural effusion or pneumothorax. No acute osseous findings are identified. Mild degenerative changes are present within thoracic spine. There are probable asymmetric glenohumeral degenerative changes on the right. IMPRESSION: Stable chest.  No active cardiopulmonary process. Electronically Signed   By: Richardean Sale M.D.   On: 12/05/2015 12:49    Procedures Procedures (including critical care time)  CRITICAL CARE Performed by: Fredia Sorrow Total critical care time: 30 minutes Critical care time was exclusive of separately billable procedures and treating other patients. Critical care was necessary to treat or prevent imminent or life-threatening deterioration. Critical care was time spent personally by me on the following activities: development of treatment plan with patient and/or surrogate as well as nursing, discussions with consultants, evaluation of patient's response to treatment, examination of patient, obtaining history from patient or surrogate, ordering and  performing treatments and interventions, ordering and review of laboratory studies, ordering and review of radiographic studies, pulse oximetry and re-evaluation of patient's condition.   Medications Ordered in ED Medications  0.9 %  sodium chloride infusion (100 mL/hr Intravenous New Bag/Given 12/06/15 1719)  potassium chloride 10 mEq in 100 mL IVPB (10 mEq Intravenous New Bag/Given 12/06/15 1935)  potassium chloride 10 mEq in 100 mL IVPB (not administered)  ondansetron (ZOFRAN-ODT) disintegrating tablet 4 mg (4 mg Oral Given 12/06/15 1347)  sodium chloride 0.9 % bolus 1,000 mL (0 mLs Intravenous Stopped 12/06/15 1819)  metoCLOPramide (REGLAN) injection 10 mg (10 mg Intravenous Given 12/06/15 1623)  haloperidol lactate (HALDOL) injection 5 mg (5 mg Intravenous Given 12/06/15 1620)  diphenhydrAMINE (BENADRYL) injection 25 mg (25 mg Intravenous Given 12/06/15 1622)  HYDROmorphone (DILAUDID) injection 1 mg (1 mg Intravenous Given 12/06/15 1624)  insulin aspart (novoLOG) injection 10 Units (10 Units Intravenous Given 12/06/15 1717)  ondansetron (ZOFRAN) injection 4 mg (4 mg Intravenous Given 12/06/15 1935)     Initial Impression / Assessment and Plan / ED Course  I have reviewed the triage vital signs and the nursing notes.  Pertinent labs & imaging results that were available during my care of the patient were reviewed by me and considered in my medical decision making (see chart for details).  Clinical Course    The patient presented with a complaint of substernal chest pain abdominal pain persistent nausea and vomiting similar to yesterday's presentation. Patient seen in the emergency department yesterday got IV fluids had troponins checked which were all negative for any acute cardiac event. Patient had hyperglycemia but no metabolic acidosis patient's blood sugar was improved.  Patient states she went home started again with the chest pain around 9 last night continue to have nausea and  vomiting. She said this is usually related to her gastroparesis. She usually requires admission.  Workup here IV fluids patient treated with Reglan Haldol hydromorphone Benadryl and Zofran with some improvement but still nauseated and vomiting improved potassium of 3.0 patient not able to tolerate oral potassium at this time started IV potassium 10 mEq 2. Discussed with hospitalist they will admit obs telemetry overnight hydrate and correct her potassium.  The hyperglycemia improved with 10 units of IV insulin blood sugars now down into 100s. Today's I showed no evidence of metabolic acidosis or diabetic ketoacidosis.  Patient probably would benefit from having a gastric emptying study and being plugged into a primary care doctor.  Final Clinical Impressions(s) / ED Diagnoses   Final diagnoses:  Gastroparesis  Hyperglycemia  Hypokalemia    New Prescriptions New Prescriptions   No medications on file     Fredia Sorrow, MD 12/06/15 1941

## 2015-12-07 ENCOUNTER — Observation Stay (HOSPITAL_BASED_OUTPATIENT_CLINIC_OR_DEPARTMENT_OTHER): Payer: Medicaid Other

## 2015-12-07 DIAGNOSIS — R079 Chest pain, unspecified: Secondary | ICD-10-CM

## 2015-12-07 DIAGNOSIS — M797 Fibromyalgia: Secondary | ICD-10-CM | POA: Diagnosis present

## 2015-12-07 DIAGNOSIS — R112 Nausea with vomiting, unspecified: Secondary | ICD-10-CM | POA: Diagnosis present

## 2015-12-07 DIAGNOSIS — K3184 Gastroparesis: Secondary | ICD-10-CM | POA: Diagnosis not present

## 2015-12-07 DIAGNOSIS — K219 Gastro-esophageal reflux disease without esophagitis: Secondary | ICD-10-CM | POA: Diagnosis present

## 2015-12-07 DIAGNOSIS — Z8673 Personal history of transient ischemic attack (TIA), and cerebral infarction without residual deficits: Secondary | ICD-10-CM | POA: Diagnosis not present

## 2015-12-07 DIAGNOSIS — E1143 Type 2 diabetes mellitus with diabetic autonomic (poly)neuropathy: Secondary | ICD-10-CM | POA: Diagnosis present

## 2015-12-07 DIAGNOSIS — D72829 Elevated white blood cell count, unspecified: Secondary | ICD-10-CM | POA: Diagnosis present

## 2015-12-07 DIAGNOSIS — E1122 Type 2 diabetes mellitus with diabetic chronic kidney disease: Secondary | ICD-10-CM | POA: Diagnosis not present

## 2015-12-07 DIAGNOSIS — Z8711 Personal history of peptic ulcer disease: Secondary | ICD-10-CM | POA: Diagnosis not present

## 2015-12-07 DIAGNOSIS — Z8249 Family history of ischemic heart disease and other diseases of the circulatory system: Secondary | ICD-10-CM | POA: Diagnosis not present

## 2015-12-07 DIAGNOSIS — N189 Chronic kidney disease, unspecified: Secondary | ICD-10-CM | POA: Diagnosis present

## 2015-12-07 DIAGNOSIS — Z833 Family history of diabetes mellitus: Secondary | ICD-10-CM | POA: Diagnosis not present

## 2015-12-07 DIAGNOSIS — Z794 Long term (current) use of insulin: Secondary | ICD-10-CM | POA: Diagnosis not present

## 2015-12-07 DIAGNOSIS — Z6841 Body Mass Index (BMI) 40.0 and over, adult: Secondary | ICD-10-CM | POA: Diagnosis not present

## 2015-12-07 DIAGNOSIS — N179 Acute kidney failure, unspecified: Secondary | ICD-10-CM | POA: Diagnosis not present

## 2015-12-07 DIAGNOSIS — I129 Hypertensive chronic kidney disease with stage 1 through stage 4 chronic kidney disease, or unspecified chronic kidney disease: Secondary | ICD-10-CM | POA: Diagnosis present

## 2015-12-07 DIAGNOSIS — E876 Hypokalemia: Secondary | ICD-10-CM | POA: Diagnosis present

## 2015-12-07 DIAGNOSIS — E1165 Type 2 diabetes mellitus with hyperglycemia: Secondary | ICD-10-CM | POA: Diagnosis present

## 2015-12-07 LAB — CBC
HCT: 29.3 % — ABNORMAL LOW (ref 36.0–46.0)
Hemoglobin: 9.6 g/dL — ABNORMAL LOW (ref 12.0–15.0)
MCH: 29.4 pg (ref 26.0–34.0)
MCHC: 32.8 g/dL (ref 30.0–36.0)
MCV: 89.6 fL (ref 78.0–100.0)
PLATELETS: 295 10*3/uL (ref 150–400)
RBC: 3.27 MIL/uL — ABNORMAL LOW (ref 3.87–5.11)
RDW: 14 % (ref 11.5–15.5)
WBC: 10.5 10*3/uL (ref 4.0–10.5)

## 2015-12-07 LAB — COMPREHENSIVE METABOLIC PANEL
ALT: 14 U/L (ref 14–54)
ANION GAP: 6 (ref 5–15)
AST: 16 U/L (ref 15–41)
Albumin: 3.1 g/dL — ABNORMAL LOW (ref 3.5–5.0)
Alkaline Phosphatase: 75 U/L (ref 38–126)
BUN: 14 mg/dL (ref 6–20)
CHLORIDE: 107 mmol/L (ref 101–111)
CO2: 26 mmol/L (ref 22–32)
Calcium: 8.1 mg/dL — ABNORMAL LOW (ref 8.9–10.3)
Creatinine, Ser: 1.03 mg/dL — ABNORMAL HIGH (ref 0.44–1.00)
Glucose, Bld: 231 mg/dL — ABNORMAL HIGH (ref 65–99)
Potassium: 3.2 mmol/L — ABNORMAL LOW (ref 3.5–5.1)
SODIUM: 139 mmol/L (ref 135–145)
Total Bilirubin: 0.9 mg/dL (ref 0.3–1.2)
Total Protein: 6.6 g/dL (ref 6.5–8.1)

## 2015-12-07 LAB — MAGNESIUM: MAGNESIUM: 1.2 mg/dL — AB (ref 1.7–2.4)

## 2015-12-07 LAB — TROPONIN I: Troponin I: <0.03 ng/mL (ref <0.03)

## 2015-12-07 LAB — GLUCOSE, CAPILLARY
GLUCOSE-CAPILLARY: 367 mg/dL — AB (ref 65–99)
Glucose-Capillary: 290 mg/dL — ABNORMAL HIGH (ref 65–99)
Glucose-Capillary: 174 mg/dL — ABNORMAL HIGH (ref 65–99)
Glucose-Capillary: 141 mg/dL — ABNORMAL HIGH (ref 65–99)

## 2015-12-07 LAB — ECHOCARDIOGRAM COMPLETE
HEIGHTINCHES: 66 in
WEIGHTICAEL: 4916.8 [oz_av]

## 2015-12-07 MED ORDER — MAGNESIUM SULFATE 2 GM/50ML IV SOLN
2.0000 g | Freq: Once | INTRAVENOUS | Status: AC
  Administered 2015-12-07: 2 g via INTRAVENOUS
  Filled 2015-12-07: qty 50

## 2015-12-07 MED ORDER — POTASSIUM CHLORIDE CRYS ER 20 MEQ PO TBCR
40.0000 meq | EXTENDED_RELEASE_TABLET | Freq: Two times a day (BID) | ORAL | Status: AC
  Administered 2015-12-07: 40 meq via ORAL
  Filled 2015-12-07 (×2): qty 2

## 2015-12-07 NOTE — Progress Notes (Signed)
  Echocardiogram 2D Echocardiogram has been performed.  Tresa Res 12/07/2015, 3:29 PM

## 2015-12-07 NOTE — Progress Notes (Signed)
PROGRESS NOTE    Deborah [name]  TMH:[id number] DOB: [birth year] DOA: 12/06/2015 PCP: Nolon Bussing, PA    Brief Narrative:  Deborah [name] is a 50 y.o. woman with a history of uncontrolled IDDM diabetes complicated by CKD and gastroparesis, prior CVA, HTN, and fibromyalgia who presents to the ED for the second day in a row complaining of recurrent chest and abdominal pain, which she attributes to a "flare up" of her gastroparesis,.  Assessment & Plan:   Principal Problem:   Gastroparesis Active Problems:   Essential hypertension, benign   Leukocytosis   Nausea with vomiting   Abdominal pain   Hypokalemia   DM (diabetes mellitus), type 2, uncontrolled (H[facility])   AKI (acute kidney injury) ([place])   Abdominal pain, nausea and vomiting, with associated chest discomfort: Possibly from gastroparesis. ACS ruled out.  Serial troponins are negative and EKG does not show any ischemic changes.  Echocardiogram ordered.  Started on IV reglan and IV anti emetics.  Symptomatic management with hydration and anti emetics.  Advanced diet as tolerated.  Pain controlled.    Hypertension: - controlled.   Hypokalemia and hypomagnesemia: Replete as needed. And repeat in am   Uncontrolled diabetes Mellitus: CBG (last 3)   Recent Labs  12/06/15 2222 12/07/15 0820 12/07/15 1202  GLUCAP 301* 367* 290*   On BID novolog 870/30 mix and resistant scale SSI.      Anemia: normocytic.  Get anemia panel an d stool for occult blood.       DVT prophylaxis: scd's Code Status: (Full) Family Communication:  None at bedside.  Disposition Plan: pending further evaluation.    Consultants:   None.    Procedures: none.   Antimicrobials:   none   Subjective: Reports pain is better , wanted to advance her diet to soft today.   Objective: Vitals:   12/06/15 2113 12/07/15 0530 12/07/15 0914 12/07/15 1430  BP: (!) 147/129 121/73 135/83 129/79  Pulse: 98 79 85 82    Resp: 16 18  18   Temp: 98.2 F (36.8 C) 97.8 F (36.6 C)  98.1 F (36.7 C)  TempSrc: Oral Oral  Oral  SpO2: 99% 98%  98%  Weight: (!) 139.4 kg (307 lb 4.8 oz)     Height:        Intake/Output Summary (Last 24 hours) at 12/07/15 1614 Last data filed at 12/07/15 1100  Gross per 24 hour  Intake          2278.33 ml  Output              300 ml  Net          1978.33 ml   Filed Weights   12/06/15 2113  Weight: (!) 139.4 kg (307 lb 4.8 oz)    Examination:  General exam: Appears calm and comfortable  Respiratory system: Clear to auscultation. Respiratory effort normal. Cardiovascular system: S1 & S2 heard, RRR. No JVD, murmurs, rubs, gallops or clicks. No pedal edema. Gastrointestinal system: Abdomen is nondistended, soft and tender in the lower quadrant. No organomegaly or masses felt. Normal bowel sounds heard. Central nervous system: Alert and oriented. No focal neurological deficits. Extremities: Symmetric 5 x 5 power. Skin: No rashes, lesions or ulcers Psychiatry: Judgement and insight appear normal. Mood & affect appropriate.     Data Reviewed: I have personally reviewed following labs and imaging studies  CBC:  Recent Labs Lab 12/05/15 1258 12/06/15 1608 12/07/15 0309  WBC 11.1* 12.0* 10.5  NEUTROABS  --  8.2*  --   HGB 11.2* 11.4* 9.6*  HCT 34.3* 34.7* 29.3*  MCV 88.9 89.0 89.6  PLT 351 364 937   Basic Metabolic Panel:  Recent Labs Lab 12/05/15 1258 12/06/15 1608 12/06/15 2234 12/07/15 0309  NA 133* 135  --  139  K 3.2* 3.0*  --  3.2*  CL 98* 99*  --  107  CO2 21* 25  --  26  GLUCOSE 430* 413*  --  231*  BUN 17 15  --  14  CREATININE 1.59* 1.37*  --  1.03*  CALCIUM 9.3 9.3  --  8.1*  MG  --   --  1.1* 1.2*   GFR: Estimated Creatinine Clearance: 93.1 mL/min (by C-G formula based on SCr of 1.03 mg/dL (H)). Liver Function Tests:  Recent Labs Lab 12/05/15 1258 12/06/15 1608 12/07/15 0309  AST 22 21 16   ALT 17 19 14   ALKPHOS 101 102 75   BILITOT 0.9 0.7 0.9  PROT 8.4* 8.4* 6.6  ALBUMIN 4.0 3.9 3.1*    Recent Labs Lab 12/05/15 1258 12/06/15 1608  LIPASE 21 43   No results for input(s): AMMONIA in the last 168 hours. Coagulation Profile: No results for input(s): INR, PROTIME in the last 168 hours. Cardiac Enzymes:  Recent Labs Lab 12/06/15 2234 12/07/15 0309 12/07/15 0945  TROPONINI <0.03 <0.03 <0.03   BNP (last 3 results) No results for input(s): PROBNP in the last 8760 hours. HbA1C: No results for input(s): HGBA1C in the last 72 hours. CBG:  Recent Labs Lab 12/06/15 1441 12/06/15 1829 12/06/15 2222 12/07/15 0820 12/07/15 1202  GLUCAP 400* 282* 301* 367* 290*   Lipid Profile: No results for input(s): CHOL, HDL, LDLCALC, TRIG, CHOLHDL, LDLDIRECT in the last 72 hours. Thyroid Function Tests: No results for input(s): TSH, T4TOTAL, FREET4, T3FREE, THYROIDAB in the last 72 hours. Anemia Panel: No results for input(s): VITAMINB12, FOLATE, FERRITIN, TIBC, IRON, RETICCTPCT in the last 72 hours. Sepsis Labs: No results for input(s): PROCALCITON, LATICACIDVEN in the last 168 hours.  No results found for this or any previous visit (from the past 240 hour(s)).       Radiology Studies: No results found.      Scheduled Meds: . allopurinol  100 mg Oral Daily  . amLODipine  10 mg Oral Daily  . DULoxetine  30 mg Oral BID  . insulin aspart  0-20 Units Subcutaneous TID WC  . insulin aspart  0-5 Units Subcutaneous QHS  . insulin aspart protamine- aspart  50 Units Subcutaneous BID WC  . metoCLOPramide (REGLAN) injection  10 mg Intravenous Q6H  . metoprolol succinate  25 mg Oral Daily  . pantoprazole  40 mg Oral BID  . potassium chloride  40 mEq Oral BID  . pravastatin  10 mg Oral Daily  . sodium chloride flush  3 mL Intravenous Q12H   Continuous Infusions: . 0.9 % NaCl with KCl 40 mEq / L 100 mL/hr (12/07/15 0920)     LOS: 0 days    Time spent: 30 minutes.     Hosie Poisson, MD [facility] Pager [phone]  If 7PM-7AM, please contact night-coverage [email or web address] Password [email or web address] 12/07/2015, 4:14 PM

## 2015-12-08 LAB — BASIC METABOLIC PANEL
Anion gap: 6 (ref 5–15)
BUN: 17 mg/dL (ref 6–20)
CALCIUM: 8.3 mg/dL — AB (ref 8.9–10.3)
CO2: 26 mmol/L (ref 22–32)
Chloride: 107 mmol/L (ref 101–111)
Creatinine, Ser: 1.28 mg/dL — ABNORMAL HIGH (ref 0.44–1.00)
GFR calc Af Amer: 55 mL/min — ABNORMAL LOW (ref >60)
GFR, EST NON AFRICAN AMERICAN: 48 mL/min — AB (ref >60)
GLUCOSE: 121 mg/dL — AB (ref 65–99)
POTASSIUM: 3.6 mmol/L (ref 3.5–5.1)
SODIUM: 139 mmol/L (ref 135–145)

## 2015-12-08 LAB — MAGNESIUM: MAGNESIUM: 2 mg/dL (ref 1.7–2.4)

## 2015-12-08 LAB — GLUCOSE, CAPILLARY
GLUCOSE-CAPILLARY: 156 mg/dL — AB (ref 65–99)
GLUCOSE-CAPILLARY: 180 mg/dL — AB (ref 65–99)
GLUCOSE-CAPILLARY: 237 mg/dL — AB (ref 65–99)
Glucose-Capillary: 130 mg/dL — ABNORMAL HIGH (ref 65–99)

## 2015-12-08 LAB — HEMOGLOBIN A1C
Hgb A1c MFr Bld: 9.9 % — ABNORMAL HIGH (ref 4.8–5.6)
MEAN PLASMA GLUCOSE: 237 mg/dL

## 2015-12-08 MED ORDER — DIPHENHYDRAMINE HCL 50 MG PO CAPS
50.0000 mg | ORAL_CAPSULE | Freq: Once | ORAL | Status: AC
  Administered 2015-12-08: 50 mg via ORAL
  Filled 2015-12-08: qty 1

## 2015-12-08 MED ORDER — DIPHENHYDRAMINE HCL 25 MG PO CAPS
25.0000 mg | ORAL_CAPSULE | ORAL | Status: DC | PRN
  Administered 2015-12-08 (×2): 25 mg via ORAL
  Filled 2015-12-08 (×2): qty 1

## 2015-12-08 NOTE — Care Management Note (Signed)
Case Management Note  Patient Details  Name: Deborah [name] MRN: [id number] Date of Birth: 1965/05/09  Subjective/Objective:  50 y/o f admitted w/gastroparesis. From home.                  Action/Plan:d/c home.   Expected Discharge Date:                  Expected Discharge Plan:  Home/Self Care  In-House Referral:     Discharge planning Services  CM Consult  Post Acute Care Choice:    Choice offered to:     DME Arranged:    DME Agency:     HH Arranged:    HH Agency:     Status of Service:  In process, will continue to follow  If discussed at Long Length of Stay Meetings, dates discussed:    Additional Comments:  Dessa Phi, RN 12/08/2015, 3:38 PM

## 2015-12-08 NOTE — Progress Notes (Signed)
PROGRESS NOTE    Deborah [name]  HAL:[id number] DOB: Sep 09, 1965 DOA: 12/06/2015 PCP: Nolon Bussing, PA    Brief Narrative:  Deborah [name] is a 50 y.o. woman with a history of uncontrolled IDDM diabetes complicated by CKD and gastroparesis, prior CVA, HTN, and fibromyalgia who presents to the ED for the second day in a row complaining of recurrent chest and abdominal pain, which she attributes to a "flare up" of her gastroparesis,.  Assessment & Plan:   Principal Problem:   Gastroparesis Active Problems:   Essential hypertension, benign   Leukocytosis   Nausea with vomiting   Abdominal pain   Hypokalemia   DM (diabetes mellitus), type 2, uncontrolled (HCC)   AKI (acute kidney injury) ([place])   Abdominal pain, nausea and vomiting, with associated chest discomfort: Possibly from gastroparesis. ACS ruled out.  Serial troponins are negative and EKG does not show any ischemic changes.  Echocardiogram ordered.  Started on IV reglan and IV anti emetics.  Symptomatic management with hydration and anti emetics.  Advanced diet as tolerated.  Today she reports pain not well controlled.    Hypertension: - controlled.   Hypokalemia and hypomagnesemia: Replete as needed. And repeat in am shows improvement.   Uncontrolled diabetes Mellitus: CBG (last 3)   Recent Labs  12/08/15 0753 12/08/15 1141 12/08/15 1702  GLUCAP 156* 180* 237*   On BID novolog 870/30 mix and resistant scale SSI.  hgba1c 9.9.  Acute renal failure:  improving with hydration     Anemia: normocytic.  Get anemia panel, and stool for occult blood pending.   Constipation: Add on stool softeners.       DVT prophylaxis: scd's Code Status: (Full) Family Communication:  None at bedside.  Disposition Plan: pending further evaluation.    Consultants:   None.    Procedures: none.   Antimicrobials:   none   Subjective: Reports pain is back,  Requesting for stool softeners.     Objective: Vitals:   12/07/15 1430 12/07/15 2055 12/08/15 0450 12/08/15 1342  BP: 129/79 111/63 96/60 129/80  Pulse: 82 80 83 93  Resp: 18 20 18 20   Temp: 98.1 F (36.7 C) 97.7 F (36.5 C) 97.8 F (36.6 C) 97.4 F (36.3 C)  TempSrc: Oral Oral Oral Oral  SpO2: 98% 98% 98% 100%  Weight:      Height:        Intake/Output Summary (Last 24 hours) at 12/08/15 1820 Last data filed at 12/08/15 1343  Gross per 24 hour  Intake             1880 ml  Output                0 ml  Net             1880 ml   Filed Weights   12/06/15 2113  Weight: (!) 139.4 kg (307 lb 4.8 oz)    Examination:  General exam: Appears calm and comfortable  Respiratory system: Clear to auscultation. Respiratory effort normal. Cardiovascular system: S1 & S2 heard, RRR. No JVD, murmurs, rubs, gallops or clicks. No pedal edema. Gastrointestinal system: Abdomen is nondistended, soft and tender in the lower quadrant. No organomegaly or masses felt. Normal bowel sounds heard. Central nervous system: Alert and oriented. No focal neurological deficits. Extremities: Symmetric 5 x 5 power. Skin: No rashes, lesions or ulcers Psychiatry: Judgement and insight appear normal. Mood & affect appropriate.     Data Reviewed: I have personally reviewed following  labs and imaging studies  CBC:  Recent Labs Lab 12/05/15 1258 12/06/15 1608 12/07/15 0309  WBC 11.1* 12.0* 10.5  NEUTROABS  --  8.2*  --   HGB 11.2* 11.4* 9.6*  HCT 34.3* 34.7* 29.3*  MCV 88.9 89.0 89.6  PLT 351 364 354   Basic Metabolic Panel:  Recent Labs Lab 12/05/15 1258 12/06/15 1608 12/06/15 2234 12/07/15 0309 12/08/15 0353  NA 133* 135  --  139 139  K 3.2* 3.0*  --  3.2* 3.6  CL 98* 99*  --  107 107  CO2 21* 25  --  26 26  GLUCOSE 430* 413*  --  231* 121*  BUN 17 15  --  14 17  CREATININE 1.59* 1.37*  --  1.03* 1.28*  CALCIUM 9.3 9.3  --  8.1* 8.3*  MG  --   --  1.1* 1.2* 2.0   GFR: Estimated Creatinine Clearance: 74.9 mL/min (by  C-G formula based on SCr of 1.28 mg/dL (H)). Liver Function Tests:  Recent Labs Lab 12/05/15 1258 12/06/15 1608 12/07/15 0309  AST 22 21 16   ALT 17 19 14   ALKPHOS 101 102 75  BILITOT 0.9 0.7 0.9  PROT 8.4* 8.4* 6.6  ALBUMIN 4.0 3.9 3.1*    Recent Labs Lab 12/05/15 1258 12/06/15 1608  LIPASE 21 43   No results for input(s): AMMONIA in the last 168 hours. Coagulation Profile: No results for input(s): INR, PROTIME in the last 168 hours. Cardiac Enzymes:  Recent Labs Lab 12/06/15 2234 12/07/15 0309 12/07/15 0945  TROPONINI <0.03 <0.03 <0.03   BNP (last 3 results) No results for input(s): PROBNP in the last 8760 hours. HbA1C:  Recent Labs  12/06/15 2234  HGBA1C 9.9*   CBG:  Recent Labs Lab 12/07/15 1639 12/07/15 2202 12/08/15 0753 12/08/15 1141 12/08/15 1702  GLUCAP 174* 141* 156* 180* 237*   Lipid Profile: No results for input(s): CHOL, HDL, LDLCALC, TRIG, CHOLHDL, LDLDIRECT in the last 72 hours. Thyroid Function Tests: No results for input(s): TSH, T4TOTAL, FREET4, T3FREE, THYROIDAB in the last 72 hours. Anemia Panel: No results for input(s): VITAMINB12, FOLATE, FERRITIN, TIBC, IRON, RETICCTPCT in the last 72 hours. Sepsis Labs: No results for input(s): PROCALCITON, LATICACIDVEN in the last 168 hours.  No results found for this or any previous visit (from the past 240 hour(s)).       Radiology Studies: No results found.      Scheduled Meds: . allopurinol  100 mg Oral Daily  . amLODipine  10 mg Oral Daily  . DULoxetine  30 mg Oral BID  . insulin aspart  0-20 Units Subcutaneous TID WC  . insulin aspart  0-5 Units Subcutaneous QHS  . insulin aspart protamine- aspart  50 Units Subcutaneous BID WC  . metoCLOPramide (REGLAN) injection  10 mg Intravenous Q6H  . metoprolol succinate  25 mg Oral Daily  . pantoprazole  40 mg Oral BID  . pravastatin  10 mg Oral Daily  . sodium chloride flush  3 mL Intravenous Q12H   Continuous Infusions: .  0.9 % NaCl with KCl 40 mEq / L 100 mL/hr (12/07/15 1945)     LOS: 1 day    Time spent: 30 minutes.     Hosie Poisson, MD [facility] Pager [phone]  If 7PM-7AM, please contact night-coverage [email or web address] Password [email or web address] 12/08/2015, 6:20 PM

## 2015-12-08 NOTE — Progress Notes (Signed)
Patient requested regular coke from [employer].  RN attempted to educate patient regarding patient's diabetes, gastroparesis, and drinking of regular coke.  Patient appeared to be receptive of this.  Patient's guest requested from another RN a regular coke for patient and this RN also attempted to educate family and patient about her diet.  Patient's guest noted to retrieve a 20 oz coke for patient.  Patient observed drinking regular soda.  1700 CBG was 237.  Will continue to monitor.

## 2015-12-09 ENCOUNTER — Inpatient Hospital Stay (HOSPITAL_COMMUNITY): Payer: Medicaid Other

## 2015-12-09 LAB — CBC
HCT: 27.9 % — ABNORMAL LOW (ref 36.0–46.0)
Hemoglobin: 9.1 g/dL — ABNORMAL LOW (ref 12.0–15.0)
MCH: 29.4 pg (ref 26.0–34.0)
MCHC: 32.6 g/dL (ref 30.0–36.0)
MCV: 90.3 fL (ref 78.0–100.0)
PLATELETS: 284 10*3/uL (ref 150–400)
RBC: 3.09 MIL/uL — AB (ref 3.87–5.11)
RDW: 14 % (ref 11.5–15.5)
WBC: 8.7 10*3/uL (ref 4.0–10.5)

## 2015-12-09 LAB — BASIC METABOLIC PANEL
Anion gap: 6 (ref 5–15)
BUN: 17 mg/dL (ref 6–20)
CALCIUM: 8.4 mg/dL — AB (ref 8.9–10.3)
CO2: 22 mmol/L (ref 22–32)
CREATININE: 1.15 mg/dL — AB (ref 0.44–1.00)
Chloride: 109 mmol/L (ref 101–111)
GFR calc Af Amer: >60 mL/min (ref >60)
GFR, EST NON AFRICAN AMERICAN: 54 mL/min — AB (ref >60)
Glucose, Bld: 154 mg/dL — ABNORMAL HIGH (ref 65–99)
POTASSIUM: 3.8 mmol/L (ref 3.5–5.1)
SODIUM: 137 mmol/L (ref 135–145)

## 2015-12-09 LAB — FERRITIN: FERRITIN: 145 ng/mL (ref 11–307)

## 2015-12-09 LAB — GLUCOSE, CAPILLARY
GLUCOSE-CAPILLARY: 124 mg/dL — AB (ref 65–99)
GLUCOSE-CAPILLARY: 153 mg/dL — AB (ref 65–99)
Glucose-Capillary: 189 mg/dL — ABNORMAL HIGH (ref 65–99)

## 2015-12-09 LAB — IRON AND TIBC
IRON: 43 ug/dL (ref 28–170)
Saturation Ratios: 13 % (ref 10.4–31.8)
TIBC: 323 ug/dL (ref 250–450)
UIBC: 280 ug/dL

## 2015-12-09 LAB — VITAMIN B12: VITAMIN B 12: 261 pg/mL (ref 180–914)

## 2015-12-09 LAB — RETICULOCYTES
RBC.: 3.09 MIL/uL — ABNORMAL LOW (ref 3.87–5.11)
RETIC COUNT ABSOLUTE: 123.6 10*3/uL (ref 19.0–186.0)
Retic Ct Pct: 4 % — ABNORMAL HIGH (ref 0.4–3.1)

## 2015-12-09 LAB — FOLATE: Folate: 4.8 ng/mL — ABNORMAL LOW (ref >5.9)

## 2015-12-09 MED ORDER — OXYCODONE HCL 5 MG PO TABS: 5.0000 mg | ORAL_TABLET | Freq: Four times a day (QID) | ORAL | 0 refills | Status: DC | PRN

## 2015-12-09 MED ORDER — FOLIC ACID 1 MG PO TABS
1.0000 mg | ORAL_TABLET | Freq: Every day | ORAL | Status: DC
  Administered 2015-12-10: 1 mg via ORAL
  Filled 2015-12-09: qty 1

## 2015-12-09 NOTE — Progress Notes (Signed)
   12/09/15 1004  What Happened  Was fall witnessed? No  Was patient injured? No  Patient found other (Comment) (in bed in room)  Found by No one-pt stated they fell  Stated prior activity shower  Follow Up  MD notified Dr. Karleen Hampshire  Time MD notified [phone]  Family notified No- patient refusal  Additional tests No  Adult Fall Risk Assessment  Risk Factor Category (scoring not indicated) Fall has occurred during this admission (document High fall risk)  Patient's Fall Risk High Fall Risk (>13 points)  Adult Fall Risk Interventions  Required Bundle Interventions *See Row Information* High fall risk - low, moderate, and high requirements implemented  Additional Interventions Individualized elimination schedule;Use of appropriate toileting equipment (bedpan, BSC, etc.)  Screening for Fall Injury Risk  Risk For Fall Injury- See Row Information  None identified  Pain Assessment  Pain Assessment No/denies pain  Neurological  Neuro (WDL) WDL  Musculoskeletal  Musculoskeletal (WDL) WDL  Assistive Device None  Generalized Weakness Yes  Integumentary  Integumentary (WDL) WDL  RN Assisting with Skin Assessment on Admission Othella Boyer  pt stated "I slipped on the towel getting out of the shower and hit my  left leg and arm. I got myself up and to the bed. I was trying to move to fast." pt was found sitting on side of bed. No injury noted on assessment and pt denies pain.

## 2015-12-09 NOTE — Progress Notes (Signed)
PROGRESS NOTE    Deborah [name]  RKY:[id number] DOB: [birth year] DOA: 12/06/2015 PCP: Nolon Bussing, PA    Brief Narrative:  Deborah [name] is a 50 y.o. woman with a history of uncontrolled IDDM diabetes complicated by CKD and gastroparesis, prior CVA, HTN, and fibromyalgia who presents to the ED for the second day in a row complaining of recurrent chest and abdominal pain, which she attributes to a "flare up" of her gastroparesis,.  Assessment & Plan:   Principal Problem:   Gastroparesis Active Problems:   Essential hypertension, benign   Leukocytosis   Nausea with vomiting   Abdominal pain   Hypokalemia   DM (diabetes mellitus), type 2, uncontrolled (HCC)   AKI (acute kidney injury) ([place])   Abdominal pain, nausea and vomiting, with associated chest discomfort: Possibly from gastroparesis. ACS ruled out.  Serial troponins are negative and EKG does not show any ischemic changes.  Echocardiogram ordered.  Started on IV reglan and IV anti emetics.  Symptomatic management with hydration and anti emetics.  Advanced diet as tolerated.   on carb modified diet, reports her abd pain is back and now she reports having diarrhea now.  Discontinue laxatives.    Hypertension: - controlled.   Hypokalemia and hypomagnesemia: Replete as needed. And repeat in am shows improvement.   Uncontrolled diabetes Mellitus: CBG (last 3)   Recent Labs  12/09/15 0736 12/09/15 1137 12/09/15 1701  GLUCAP 124* 153* 189*   On BID novolog 870/30 mix and resistant scale SSI.  hgba1c 9.9.  Acute renal failure:  improving with hydration     Anemia: normocytic.   anemia panel shows low folic acid and vitamin b12 levels. , and stool for occult blood pending.  b12 and folate supplemented.        DVT prophylaxis: scd's Code Status: (Full) Family Communication:  None at bedside.  Disposition Plan: possible home in am.    Consultants:   None.    Procedures: none.     Antimicrobials:   none   Subjective: Reports her abd pain is back and now she is having diarrhea.   Objective: Vitals:   12/08/15 2048 12/09/15 0500 12/09/15 1003 12/09/15 1300  BP: 126/83 118/78 (!) 146/79 135/79  Pulse: 93 97 (!) 116 91  Resp: 20 18  18   Temp: 97.8 F (36.6 C) 97.9 F (36.6 C)  98.3 F (36.8 C)  TempSrc: Oral Oral  Oral  SpO2: 100% 100% 100% 98%  Weight:      Height:        Intake/Output Summary (Last 24 hours) at 12/09/15 1814 Last data filed at 12/09/15 0952  Gross per 24 hour  Intake             1740 ml  Output                0 ml  Net             1740 ml   Filed Weights   12/06/15 2113  Weight: (!) 139.4 kg (307 lb 4.8 oz)    Examination:  General exam: Appears calm and comfortable  Respiratory system: Clear to auscultation. Respiratory effort normal. Cardiovascular system: S1 & S2 heard, RRR. No JVD, murmurs, rubs, gallops or clicks. No pedal edema. Gastrointestinal system: Abdomen is nondistended, soft and tender in the lower quadrant. No organomegaly or masses felt. Normal bowel sounds heard. Central nervous system: Alert and oriented. No focal neurological deficits. Extremities: Symmetric 5 x 5 power. Skin: No  rashes, lesions or ulcers Psychiatry: Judgement and insight appear normal. Mood & affect appropriate.     Data Reviewed: I have personally reviewed following labs and imaging studies  CBC:  Recent Labs Lab 12/05/15 1258 12/06/15 1608 12/07/15 0309 12/09/15 1055  WBC 11.1* 12.0* 10.5 8.7  NEUTROABS  --  8.2*  --   --   HGB 11.2* 11.4* 9.6* 9.1*  HCT 34.3* 34.7* 29.3* 27.9*  MCV 88.9 89.0 89.6 90.3  PLT 351 364 295 712   Basic Metabolic Panel:  Recent Labs Lab 12/05/15 1258 12/06/15 1608 12/06/15 2234 12/07/15 0309 12/08/15 0353 12/09/15 1055  NA 133* 135  --  139 139 137  K 3.2* 3.0*  --  3.2* 3.6 3.8  CL 98* 99*  --  107 107 109  CO2 21* 25  --  26 26 22   GLUCOSE 430* 413*  --  231* 121* 154*  BUN 17  15  --  14 17 17   CREATININE 1.59* 1.37*  --  1.03* 1.28* 1.15*  CALCIUM 9.3 9.3  --  8.1* 8.3* 8.4*  MG  --   --  1.1* 1.2* 2.0  --    GFR: Estimated Creatinine Clearance: 83.4 mL/min (by C-G formula based on SCr of 1.15 mg/dL (H)). Liver Function Tests:  Recent Labs Lab 12/05/15 1258 12/06/15 1608 12/07/15 0309  AST 22 21 16   ALT 17 19 14   ALKPHOS 101 102 75  BILITOT 0.9 0.7 0.9  PROT 8.4* 8.4* 6.6  ALBUMIN 4.0 3.9 3.1*    Recent Labs Lab 12/05/15 1258 12/06/15 1608  LIPASE 21 43   No results for input(s): AMMONIA in the last 168 hours. Coagulation Profile: No results for input(s): INR, PROTIME in the last 168 hours. Cardiac Enzymes:  Recent Labs Lab 12/06/15 2234 12/07/15 0309 12/07/15 0945  TROPONINI <0.03 <0.03 <0.03   BNP (last 3 results) No results for input(s): PROBNP in the last 8760 hours. HbA1C:  Recent Labs  12/06/15 2234  HGBA1C 9.9*   CBG:  Recent Labs Lab 12/08/15 1702 12/08/15 2048 12/09/15 0736 12/09/15 1137 12/09/15 1701  GLUCAP 237* 130* 124* 153* 189*   Lipid Profile: No results for input(s): CHOL, HDL, LDLCALC, TRIG, CHOLHDL, LDLDIRECT in the last 72 hours. Thyroid Function Tests: No results for input(s): TSH, T4TOTAL, FREET4, T3FREE, THYROIDAB in the last 72 hours. Anemia Panel:  Recent Labs  12/09/15 0521  VITAMINB12 261  FOLATE 4.8*  FERRITIN 145  TIBC 323  IRON 43  RETICCTPCT 4.0*   Sepsis Labs: No results for input(s): PROCALCITON, LATICACIDVEN in the last 168 hours.  No results found for this or any previous visit (from the past 240 hour(s)).       Radiology Studies: Dg Wrist Complete Left  Result Date: 12/09/2015 CLINICAL DATA:  50 year old female with history of trauma from a fall 45 minutes ago. Injury to the left arm complaining of posterior left wrist pain. EXAM: LEFT WRIST - COMPLETE 3+ VIEW COMPARISON:  No priors. FINDINGS: Multiple views of the left wrist demonstrate no acute displaced fracture,  subluxation, dislocation, or soft tissue abnormality. IMPRESSION: No acute radiographic abnormality of the left wrist. Electronically Signed   By: Vinnie Langton M.D.   On: 12/09/2015 11:12        Scheduled Meds: . allopurinol  100 mg Oral Daily  . amLODipine  10 mg Oral Daily  . DULoxetine  30 mg Oral BID  . folic acid  1 mg Oral Daily  . insulin aspart  0-20 Units Subcutaneous TID WC  . insulin aspart  0-5 Units Subcutaneous QHS  . insulin aspart protamine- aspart  50 Units Subcutaneous BID WC  . metoCLOPramide (REGLAN) injection  10 mg Intravenous Q6H  . metoprolol succinate  25 mg Oral Daily  . pantoprazole  40 mg Oral BID  . pravastatin  10 mg Oral Daily  . sodium chloride flush  3 mL Intravenous Q12H   Continuous Infusions:     LOS: 2 days    Time spent: 30 minutes.     Hosie Poisson, MD [facility] Pager [phone]  If 7PM-7AM, please contact night-coverage [email or web address] Password [email or web address] 12/09/2015, 6:14 PM

## 2015-12-10 LAB — BASIC METABOLIC PANEL
ANION GAP: 5 (ref 5–15)
BUN: 17 mg/dL (ref 6–20)
CALCIUM: 8.6 mg/dL — AB (ref 8.9–10.3)
CHLORIDE: 108 mmol/L (ref 101–111)
CO2: 23 mmol/L (ref 22–32)
CREATININE: 0.92 mg/dL (ref 0.44–1.00)
GFR calc non Af Amer: >60 mL/min (ref >60)
GLUCOSE: 172 mg/dL — AB (ref 65–99)
Potassium: 3.5 mmol/L (ref 3.5–5.1)
SODIUM: 136 mmol/L (ref 135–145)

## 2015-12-10 LAB — GLUCOSE, CAPILLARY
GLUCOSE-CAPILLARY: 163 mg/dL — AB (ref 65–99)
Glucose-Capillary: 177 mg/dL — ABNORMAL HIGH (ref 65–99)
Glucose-Capillary: 149 mg/dL — ABNORMAL HIGH (ref 65–99)

## 2015-12-10 MED ORDER — METOCLOPRAMIDE HCL 10 MG PO TABS: 10.0000 mg | ORAL_TABLET | Freq: Four times a day (QID) | ORAL | 0 refills | Status: DC | PRN

## 2015-12-10 MED ORDER — FOLIC ACID 1 MG PO TABS: 1.0000 mg | ORAL_TABLET | Freq: Every day | ORAL | 0 refills | Status: DC

## 2015-12-10 MED ORDER — METOCLOPRAMIDE HCL 5 MG PO TABS: 5.0000 mg | ORAL_TABLET | Freq: Four times a day (QID) | ORAL | Status: DC | PRN

## 2015-12-10 MED ORDER — METOCLOPRAMIDE HCL 10 MG PO TABS: 10.0000 mg | ORAL_TABLET | Freq: Four times a day (QID) | ORAL | Status: DC | PRN

## 2015-12-10 NOTE — Discharge Summary (Signed)
Discharge Summary  Deborah [name] QMV:[id number] DOB: 08-26-1965  PCP: Nolon Bussing, PA  Admit date: 12/06/2015 Discharge date: 12/10/2015   Recommendations for Outpatient Follow-up:  1. PCP 1-2 weeks   Discharge Diagnoses:  Active Hospital Problems   Diagnosis Date Noted  . Gastroparesis 09/26/2015  . AKI (acute kidney injury) ([place]) 12/06/2015  . DM (diabetes mellitus), type 2, uncontrolled ([place]) 09/26/2015  . Hypokalemia 08/20/2015  . Abdominal pain 05/26/2015  . Nausea with vomiting 05/26/2015  . Leukocytosis 05/26/2015  . Essential hypertension, benign 09/28/2013    Resolved Hospital Problems   Diagnosis Date Noted Date Resolved  No resolved problems to display.    Discharge Condition: Stable   Diet recommendation: Diabetic   Vitals:   12/10/15 0440 12/10/15 0942  BP: 124/88 125/82  Pulse: (!) 101 95  Resp: 20   Temp: 97.6 F (36.4 C)     HPI and Brief Hospital Course:  Deborah [name] a 50 y.o.woman with a history of uncontrolled IDDM diabetes complicated by CKD and gastroparesis, prior CVA, HTN, and fibromyalgia who presented on 12/06/15 to the ED for the second day in a row complaining of recurrent chest and abdominal pain, which she attributes to a "flare up" of her gastroparesis. ACS was ruled out and she was treated with pain and nausea medications as well as bowel rest, and was ultimately able to advance her diet. She is tolerating food today with minimal pain and no vomiting, her electrolytes are unremarkable and she is ready for discharge home today.  Discharge details, plan of care and follow up instructions were discussed with patient and any available family or care providers. Patient and family are in agreement with discharge from the hospital today and all questions were answered to their satisfaction.  Details of Hospital Course:  Abdominal pain, nausea and vomiting, with associated chest discomfort on admission. Most likely from  gastroparesis. ACS ruled out with serial troponins are negative and EKG does not show any ischemic changes. Echocardiogram without acute findings. Started on IV reglan and IV anti emetics, now better and tolerating PO medications. Symptomatic management with hydration and anti emetics, diet advanced as tolerated. On carb modified diet but noted to be noncompliant, was treated with Novolog 70/30 BID and SSI. A1c of 9.9.  Noted to have low B12 and Folate with supplementation provided.  Procedures:  Echo 10/1  Left wrist XR after fall 10/3 normal   Consultations:  None   Discharge Exam: BP 125/82   Pulse 95   Temp 97.6 F (36.4 C) (Oral)   Resp 20   Ht 5\' 6"  (1.676 m)   Wt (!) 139.4 kg (307 lb 4.8 oz)   LMP 10/10/2012   SpO2 100%   BMI 49.60 kg/m  General:  Alert, oriented, calm, in no acute distress  Eyes: EOMI, clear sclerea Neck: supple, no masses, trachea mildline  Cardiovascular: RRR, no murmurs or rubs, no peripheral edema  Respiratory: clear to auscultation bilaterally, no wheezes, no crackles  Abdomen: soft, nontender, nondistended, normal bowel tones heard  Skin: dry, no rashes  Musculoskeletal: no joint effusions, normal range of motion  Psychiatric: appropriate affect, normal speech  Neurologic: extraocular muscles intact, clear speech, moving all extremities with intact sensorium   Discharge Instructions You were cared for by a hospitalist during your hospital stay. If you have any questions about your discharge medications or the care you received while you were in the hospital after you are discharged, you can call the unit and  asked to speak with the hospitalist on call if the hospitalist that took care of you is not available. Once you are discharged, your primary care physician will handle any further medical issues. Please note that NO REFILLS for any discharge medications will be authorized once you are discharged, as it is imperative that you return to your  primary care physician (or establish a relationship with a primary care physician if you do not have one) for your aftercare needs so that they can reassess your need for medications and monitor your lab values.  Discharge Instructions    Call MD for:  persistant nausea and vomiting    Complete by:  As directed    Call MD for:  severe uncontrolled pain    Complete by:  As directed    Call MD for:  temperature >100.4    Complete by:  As directed    Diet - low sodium heart healthy    Complete by:  As directed    Increase activity slowly    Complete by:  As directed        Medication List    STOP taking these medications   hydrochlorothiazide 25 MG tablet Commonly known as:  HYDRODIURIL     TAKE these medications   albuterol 108 (90 Base) MCG/ACT inhaler Commonly known as:  PROVENTIL HFA;VENTOLIN HFA Inhale 2 puffs into the lungs every 6 (six) hours as needed for wheezing or shortness of breath.   allopurinol 100 MG tablet Commonly known as:  ZYLOPRIM Take 1 tablet (100 mg total) by mouth daily.   amLODipine 10 MG tablet Commonly known as:  NORVASC Take 1 tablet (10 mg total) by mouth daily.   DULoxetine 30 MG capsule Commonly known as:  CYMBALTA Take 1 capsule (30 mg total) by mouth 2 (two) times daily.   folic acid 1 MG tablet Commonly known as:  FOLVITE Take 1 tablet (1 mg total) by mouth daily.   metoCLOPramide 10 MG tablet Commonly known as:  REGLAN Take 1 tablet (10 mg total) by mouth 4 (four) times daily -  before meals and at bedtime. Take 30 min prior to eating What changed:  Another medication with the same name was added. Make sure you understand how and when to take each.   metoCLOPramide 10 MG tablet Commonly known as:  REGLAN Take 1 tablet (10 mg total) by mouth every 6 (six) hours as needed for nausea or vomiting. What changed:  You were already taking a medication with the same name, and this prescription was added. Make sure you understand how and  when to take each.   metoprolol succinate 25 MG 24 hr tablet Commonly known as:  TOPROL-XL Take 1 tablet (25 mg total) by mouth daily.   nitroGLYCERIN 0.4 MG SL tablet Commonly known as:  NITROSTAT Place 0.4 mg under the tongue every 5 (five) minutes as needed for chest pain.   NOVOLOG MIX 70/30 (70-30) 100 UNIT/ML injection Generic drug:  insulin aspart protamine- aspart Inject 0.5-0.75 mLs (50-75 Units total) into the skin 3 (three) times daily after meals. 75 units every morning, 60 units with lunch, and 50 units with dinner   oxyCODONE 5 MG immediate release tablet Commonly known as:  Oxy IR/ROXICODONE Take 1 tablet (5 mg total) by mouth every 6 (six) hours as needed for moderate pain. What changed:  medication strength  how much to take  reasons to take this   pantoprazole 40 MG tablet Commonly known as:  PROTONIX  Take 1 tablet (40 mg total) by mouth 2 (two) times daily.   pravastatin 10 MG tablet Commonly known as:  PRAVACHOL Take 1 tablet (10 mg total) by mouth daily.   ranitidine 150 MG tablet Commonly known as:  ZANTAC Take 1 tablet (150 mg total) by mouth daily as needed for heartburn. What changed:  when to take this   senna 8.6 MG tablet Commonly known as:  SENOKOT Take 1 tablet (8.6 mg total) by mouth 2 (two) times daily. What changed:  when to take this  reasons to take this   Vitamin D (Ergocalciferol) 50000 units Caps capsule Commonly known as:  DRISDOL Take 1 capsule (50,000 Units total) by mouth every 7 (seven) days. What changed:  when to take this      Allergies  Allergen Reactions  . Lisinopril Anaphylaxis    Tongue and mouth swelling  . Penicillins Palpitations    Has patient had a PCN reaction causing immediate rash, facial/tongue/throat swelling, SOB or lightheadedness with hypotension: Yes Has patient had a PCN reaction causing severe rash involving mucus membranes or skin necrosis: No Has patient had a PCN reaction that required  hospitalization: Yes  Has patient had a PCN reaction occurring within the last 10 years: No   . Valium [Diazepam] [name]  . Aspirin Other (See Comments)    Irritates stomach ulcer   . Food Hives and Swelling    Carrots, ketchup   . Nsaids Other (See Comments)    ULCER  . Tylenol [Acetaminophen] Other (See Comments)    Irritates stomach ulcer   . Morphine And Related Palpitations      The results of significant diagnostics from this hospitalization (including imaging, microbiology, ancillary and laboratory) are listed below for reference.    Significant Diagnostic Studies: Dg Chest 2 View  Result Date: 12/05/2015 CLINICAL DATA:  Chest pain with nausea and vomiting today. History of diabetes and hypertension. EXAM: CHEST  2 VIEW COMPARISON:  11/03/2015. FINDINGS: The heart size and mediastinal contours are normal. The lungs are clear. There is no pleural effusion or pneumothorax. No acute osseous findings are identified. Mild degenerative changes are present within thoracic spine. There are probable asymmetric glenohumeral degenerative changes on the right. IMPRESSION: Stable chest.  No active cardiopulmonary process. Electronically Signed   By: Richardean Sale M.D.   On: 12/05/2015 12:49   Dg Wrist Complete Left  Result Date: 12/09/2015 CLINICAL DATA:  50 year old female with history of trauma from a fall 45 minutes ago. Injury to the left arm complaining of posterior left wrist pain. EXAM: LEFT WRIST - COMPLETE 3+ VIEW COMPARISON:  No priors. FINDINGS: Multiple views of the left wrist demonstrate no acute displaced fracture, subluxation, dislocation, or soft tissue abnormality. IMPRESSION: No acute radiographic abnormality of the left wrist. Electronically Signed   By: Vinnie Langton M.D.   On: 12/09/2015 11:12    Microbiology: No results found for this or any previous visit (from the past 240 hour(s)).   Labs: Basic Metabolic Panel:  Recent Labs Lab 12/06/15 1608  12/06/15 2234 12/07/15 0309 12/08/15 0353 12/09/15 1055 12/10/15 0821  NA 135  --  139 139 137 136  K 3.0*  --  3.2* 3.6 3.8 3.5  CL 99*  --  107 107 109 108  CO2 25  --  26 26 22 23   GLUCOSE 413*  --  231* 121* 154* 172*  BUN 15  --  14 17 17 17   CREATININE 1.37*  --  1.03* 1.28* 1.15* 0.92  CALCIUM 9.3  --  8.1* 8.3* 8.4* 8.6*  MG  --  1.1* 1.2* 2.0  --   --    Liver Function Tests:  Recent Labs Lab 12/05/15 1258 12/06/15 1608 12/07/15 0309  AST 22 21 16   ALT 17 19 14   ALKPHOS 101 102 75  BILITOT 0.9 0.7 0.9  PROT 8.4* 8.4* 6.6  ALBUMIN 4.0 3.9 3.1*    Recent Labs Lab 12/05/15 1258 12/06/15 1608  LIPASE 21 43   No results for input(s): AMMONIA in the last 168 hours. CBC:  Recent Labs Lab 12/05/15 1258 12/06/15 1608 12/07/15 0309 12/09/15 1055  WBC 11.1* 12.0* 10.5 8.7  NEUTROABS  --  8.2*  --   --   HGB 11.2* 11.4* 9.6* 9.1*  HCT 34.3* 34.7* 29.3* 27.9*  MCV 88.9 89.0 89.6 90.3  PLT 351 364 295 284   Cardiac Enzymes:  Recent Labs Lab 12/06/15 2234 12/07/15 0309 12/07/15 0945  TROPONINI <0.03 <0.03 <0.03   BNP: BNP (last 3 results) No results for input(s): BNP in the last 8760 hours.  ProBNP (last 3 results) No results for input(s): PROBNP in the last 8760 hours.  CBG:  Recent Labs Lab 12/09/15 1137 12/09/15 1701 12/09/15 2145 12/10/15 0729 12/10/15 1142  GLUCAP 153* 189* 163* 177* 149*    Time spent: 32 minutes were spent in preparing this discharge including medication reconciliation, counseling, and coordination of care.  Signed:  Mir Marry Guan, MD  [facility] 12/10/2015, 12:44 PM

## 2015-12-16 NOTE — Progress Notes (Signed)
Pt called out to NS and stated " I fell in the bathroom floor getting out of the shower." This RN and CN ran to the room and found the pt sitting in bed with a gown on and dry. Pt stated " I slipped on the towel on the floor getting out of the shower." the RN and CN only saw one towel in the bathroom, and the towel was in the shower soaking wet. Pt stated "I'm ok I almost didn't say anything." MD paged and pt's primary RN notified, assessment completed by CN and VSS. Fall protocol followed and bed alarm activated.

## 2015-12-18 ENCOUNTER — Observation Stay (HOSPITAL_COMMUNITY)
Admission: EM | Admit: 2015-12-18 | Discharge: 2015-12-20 | Disposition: A | Discharge status: Home / Self Care | Payer: Self-pay | Attending: Internal Medicine | Admitting: Internal Medicine

## 2015-12-18 ENCOUNTER — Emergency Department (HOSPITAL_COMMUNITY): Payer: Self-pay

## 2015-12-18 ENCOUNTER — Encounter (HOSPITAL_COMMUNITY): Payer: Self-pay | Admitting: Emergency Medicine

## 2015-12-18 DIAGNOSIS — E119 Type 2 diabetes mellitus without complications: Secondary | ICD-10-CM | POA: Insufficient documentation

## 2015-12-18 DIAGNOSIS — I10 Essential (primary) hypertension: Secondary | ICD-10-CM | POA: Diagnosis present

## 2015-12-18 DIAGNOSIS — R0789 Other chest pain: Principal | ICD-10-CM | POA: Insufficient documentation

## 2015-12-18 DIAGNOSIS — R079 Chest pain, unspecified: Secondary | ICD-10-CM | POA: Diagnosis present

## 2015-12-18 DIAGNOSIS — R112 Nausea with vomiting, unspecified: Secondary | ICD-10-CM | POA: Diagnosis present

## 2015-12-18 DIAGNOSIS — I129 Hypertensive chronic kidney disease with stage 1 through stage 4 chronic kidney disease, or unspecified chronic kidney disease: Secondary | ICD-10-CM | POA: Insufficient documentation

## 2015-12-18 DIAGNOSIS — Z79899 Other long term (current) drug therapy: Secondary | ICD-10-CM | POA: Insufficient documentation

## 2015-12-18 DIAGNOSIS — N183 Chronic kidney disease, stage 3 (moderate): Secondary | ICD-10-CM | POA: Insufficient documentation

## 2015-12-18 DIAGNOSIS — Z8673 Personal history of transient ischemic attack (TIA), and cerebral infarction without residual deficits: Secondary | ICD-10-CM | POA: Insufficient documentation

## 2015-12-18 HISTORY — DX: Chest pain, unspecified: R07.9

## 2015-12-18 HISTORY — DX: Obesity, unspecified: E66.9

## 2015-12-18 LAB — BASIC METABOLIC PANEL
Anion gap: 12 (ref 5–15)
BUN: 9 mg/dL (ref 6–20)
CHLORIDE: 96 mmol/L — AB (ref 101–111)
CO2: 27 mmol/L (ref 22–32)
CREATININE: 1.13 mg/dL — AB (ref 0.44–1.00)
Calcium: 8.9 mg/dL (ref 8.9–10.3)
GFR, EST NON AFRICAN AMERICAN: 55 mL/min — AB (ref >60)
Glucose, Bld: 307 mg/dL — ABNORMAL HIGH (ref 65–99)
Potassium: 2.8 mmol/L — ABNORMAL LOW (ref 3.5–5.1)
SODIUM: 135 mmol/L (ref 135–145)

## 2015-12-18 LAB — CBC
HCT: 32.2 % — ABNORMAL LOW (ref 36.0–46.0)
Hemoglobin: 10.5 g/dL — ABNORMAL LOW (ref 12.0–15.0)
MCH: 29.1 pg (ref 26.0–34.0)
MCHC: 32.6 g/dL (ref 30.0–36.0)
MCV: 89.2 fL (ref 78.0–100.0)
PLATELETS: 358 10*3/uL (ref 150–400)
RBC: 3.61 MIL/uL — AB (ref 3.87–5.11)
RDW: 13.8 % (ref 11.5–15.5)
WBC: 10.7 10*3/uL — AB (ref 4.0–10.5)

## 2015-12-18 LAB — I-STAT TROPONIN, ED: TROPONIN I, POC: 0.02 ng/mL (ref 0.00–0.08)

## 2015-12-18 MED ORDER — FENTANYL CITRATE (PF) 100 MCG/2ML IJ SOLN
50.0000 ug | Freq: Once | INTRAMUSCULAR | Status: AC
  Administered 2015-12-18: 50 ug via INTRAVENOUS
  Filled 2015-12-18: qty 2

## 2015-12-18 MED ORDER — DIPHENHYDRAMINE HCL 50 MG/ML IJ SOLN: 25.0000 mg | Freq: Once | INTRAMUSCULAR | Status: DC

## 2015-12-18 MED ORDER — SODIUM CHLORIDE 0.9 % IV BOLUS (SEPSIS)
1000.0000 mL | Freq: Once | INTRAVENOUS | Status: AC
  Administered 2015-12-18: 1000 mL via INTRAVENOUS

## 2015-12-18 MED ORDER — HALOPERIDOL LACTATE 5 MG/ML IJ SOLN
2.0000 mg | Freq: Once | INTRAMUSCULAR | Status: AC
  Administered 2015-12-18: 2 mg via INTRAVENOUS
  Filled 2015-12-18: qty 1

## 2015-12-18 MED ORDER — POTASSIUM CHLORIDE CRYS ER 20 MEQ PO TBCR
20.0000 meq | EXTENDED_RELEASE_TABLET | Freq: Once | ORAL | Status: AC
  Administered 2015-12-18: 20 meq via ORAL
  Filled 2015-12-18: qty 1

## 2015-12-18 NOTE — ED Provider Notes (Signed)
[facility] DEPT Provider Note   [name]: [id number] Arrival date & time: 12/18/15  1933     History   Chief Complaint Chief Complaint  Patient presents with  . Chest Pain    HPI ILANI OTTERSON is a 50 y.o. female.   Chest Pain   This is a new problem. The current episode started 12 to 24 hours ago. The problem occurs constantly. The problem has been gradually worsening. The pain is present in the substernal region. The pain is severe. The pain radiates to the left shoulder and left arm. Associated symptoms include abdominal pain (suprapubic), malaise/fatigue and vomiting. Pertinent negatives include no back pain, no cough, no diaphoresis, no fever, no lower extremity edema, no palpitations and no shortness of breath. She has tried nothing for the symptoms.  Her past medical history is significant for diabetes and strokes.  Pertinent negatives for past medical history include no seizures.    Past Medical History:  Diagnosis Date  . Diabetes mellitus   . Fibromyalgia   . Gastric ulcer   . Gastroparesis   . Hypertension   . Obesity   . Pneumonia   . Stroke ([place]) 02/2013  . Stroke [facility])     Patient Active Problem List   Diagnosis Date Noted  . AKI (acute kidney injury) ([place]) 12/06/2015  . Chest pain 09/26/2015  . Hypokalemia 09/26/2015  . DM (diabetes mellitus), type 2, uncontrolled ([place]) 09/26/2015  . Hypomagnesemia 09/26/2015  . Gastroparesis 09/26/2015  . Gastroparesis 09/21/2015  . Hypokalemia 08/20/2015  . Hypomagnesemia 08/20/2015  . Nausea and vomiting 08/20/2015  . UTI (urinary tract infection) 05/27/2015  . Gout flare 05/27/2015  . Leukocytosis 05/26/2015  . Nausea with vomiting 05/26/2015  . Abdominal pain 05/26/2015  . [name] 05/26/2015  . DKA (diabetic ketoacidoses) ([place]) 05/25/2015  . Uncontrolled type 2 diabetes mellitus with diabetic neuropathy, with long-term current use of insulin ([place]) 05/25/2015  . Dyslipidemia associated  with type 2 diabetes mellitus ([place]) 05/25/2015  . CKD (chronic kidney disease) stage 3, GFR 30-59 ml/min 05/25/2015  . Essential hypertension, benign 09/28/2013    Past Surgical History:  Procedure Laterality Date  . CATARACT EXTRACTION  01/2014  . CHOLECYSTECTOMY      OB History    No data available       Home Medications    Prior to Admission medications   Medication Sig Start Date End Date Taking? Authorizing Provider  albuterol (PROVENTIL HFA;VENTOLIN HFA) 108 (90 Base) MCG/ACT inhaler Inhale 2 puffs into the lungs every 6 (six) hours as needed for wheezing or shortness of breath. 09/23/15   Debbe Odea, MD  allopurinol (ZYLOPRIM) 100 MG tablet Take 1 tablet (100 mg total) by mouth daily. 09/23/15   Debbe Odea, MD  amLODipine (NORVASC) 10 MG tablet Take 1 tablet (10 mg total) by mouth daily. 09/23/15   Debbe Odea, MD  DULoxetine (CYMBALTA) 30 MG capsule Take 1 capsule (30 mg total) by mouth 2 (two) times daily. 09/23/15   Debbe Odea, MD  folic acid (FOLVITE) 1 MG tablet Take 1 tablet (1 mg total) by mouth daily. 12/10/15   Mir Marry Guan, MD  metoCLOPramide (REGLAN) 10 MG tablet Take 1 tablet (10 mg total) by mouth 4 (four) times daily -  before meals and at bedtime. Take 30 min prior to eating 09/23/15   Debbe Odea, MD  metoCLOPramide (REGLAN) 10 MG tablet Take 1 tablet (10 mg total) by mouth every 6 (six) hours as needed for nausea  or vomiting. 12/10/15   Mir Marry Guan, MD  metoprolol succinate (TOPROL-XL) 25 MG 24 hr tablet Take 1 tablet (25 mg total) by mouth daily. 09/23/15   Debbe Odea, MD  nitroGLYCERIN (NITROSTAT) 0.4 MG SL tablet Place 0.4 mg under the tongue every 5 (five) minutes as needed for chest pain.    Historical Provider, MD  NOVOLOG MIX 70/30 (70-30) 100 UNIT/ML injection Inject 0.5-0.75 mLs (50-75 Units total) into the skin 3 (three) times daily after meals. 75 units every morning, 60 units with lunch, and 50 units with dinner 09/23/15    Debbe Odea, MD  oxyCODONE (OXY IR/ROXICODONE) 5 MG immediate release tablet Take 1 tablet (5 mg total) by mouth every 6 (six) hours as needed for moderate pain. 12/09/15   Hosie Poisson, MD  pantoprazole (PROTONIX) 40 MG tablet Take 1 tablet (40 mg total) by mouth 2 (two) times daily. 09/23/15   Debbe Odea, MD  pravastatin (PRAVACHOL) 10 MG tablet Take 1 tablet (10 mg total) by mouth daily. 09/23/15   Debbe Odea, MD  ranitidine (ZANTAC) 150 MG tablet Take 1 tablet (150 mg total) by mouth daily as needed for heartburn. Patient taking differently: Take 150 mg by mouth 3 (three) times daily as needed for heartburn.  09/23/15   Debbe Odea, MD  senna (SENOKOT) 8.6 MG tablet Take 1 tablet (8.6 mg total) by mouth 2 (two) times daily. Patient taking differently: Take 1 tablet by mouth daily as needed for constipation.  09/23/15 09/22/16  Debbe Odea, MD  Vitamin D, Ergocalciferol, (DRISDOL) 50000 units CAPS capsule Take 1 capsule (50,000 Units total) by mouth every 7 (seven) days. Patient taking differently: Take 50,000 Units by mouth every 30 (thirty) days.  09/23/15   Debbe Odea, MD    Family History Family History  Problem Relation Age of Onset  . Diabetes Mother   . Diabetes Father   . Heart disease Father   . Diabetes Sister   . Diabetes Brother     Social History Social History  Substance Use Topics  . Smoking status: Never Smoker  . Smokeless tobacco: Never Used  . Alcohol use No     Allergies   Lisinopril; Penicillins; Valium [diazepam]; Aspirin; Food; Nsaids; Tylenol [acetaminophen]; and Morphine and related   Review of Systems Review of Systems  Constitutional: Positive for malaise/fatigue. Negative for chills, diaphoresis and fever.  HENT: Negative for ear pain and sore throat.   Eyes: Negative for pain and visual disturbance.  Respiratory: Negative for cough and shortness of breath.   Cardiovascular: Positive for chest pain. Negative for palpitations.    Gastrointestinal: Positive for abdominal pain (suprapubic) and vomiting.  Genitourinary: Negative for dysuria and hematuria.  Musculoskeletal: Negative for arthralgias and back pain.  Skin: Negative for color change and rash.  Neurological: Negative for seizures and syncope.  All other systems reviewed and are negative.    Physical Exam Updated Vital Signs BP 142/84   Pulse 108   Temp 97.4 F (36.3 C) (Oral)   Resp 18   Ht 5\' 6"  (1.676 m)   Wt (!) 139.3 kg   LMP 10/10/2012   SpO2 100%   BMI 49.55 kg/m   Physical Exam  Constitutional: She is oriented to person, place, and time. She appears well-developed and well-nourished.  HENT:  Head: Normocephalic and atraumatic.  Eyes: Conjunctivae and EOM are normal. Pupils are equal, round, and reactive to light.  Neck: Normal range of motion. Neck supple.  Cardiovascular: Regular rhythm.  tachycardic  Pulmonary/Chest: Effort normal and breath sounds normal.  Abdominal: Soft. There is tenderness (suprapubic).  Musculoskeletal: She exhibits no edema.  Neurological: She is alert and oriented to person, place, and time.  Skin: Skin is warm and dry.  Psychiatric: She has a normal mood and affect.  Nursing note and vitals reviewed.    ED Treatments / Results  Labs (all labs ordered are listed, but only abnormal results are displayed) Labs Reviewed  BASIC METABOLIC PANEL - Abnormal; Notable for the following:       Result Value   Potassium 2.8 (*)    Chloride 96 (*)    Glucose, Bld 307 (*)    Creatinine, Ser 1.13 (*)    GFR calc non Af Amer 55 (*)    All other components within normal limits  CBC - Abnormal; Notable for the following:    WBC 10.7 (*)    RBC 3.61 (*)    Hemoglobin 10.5 (*)    HCT 32.2 (*)    All other components within normal limits  I-STAT TROPOININ, ED    EKG  EKG Interpretation  Date/Time:  Thursday December 18 2015 19:42:11 EDT Ventricular Rate:  111 PR Interval:  166 QRS Duration: 80 QT  Interval:  352 QTC Calculation: 478 R Axis:   -31 Text Interpretation:  Sinus tachycardia Left axis deviation Left axis deviation T wave abnormality No significant change since last tracing Abnormal ekg Confirmed by Carmin Muskrat  MD [phone]) on 12/18/2015 9:43:15 PM       Radiology Dg Chest 2 View  Result Date: 12/18/2015 CLINICAL DATA:  Left-sided chest pain with shortness of breath beginning this morning. Hypertension and diabetes. EXAM: CHEST  2 VIEW COMPARISON:  09/26/2015 FINDINGS: The heart size and mediastinal contours are within normal limits. Both lungs are clear. The visualized skeletal structures are unremarkable. IMPRESSION: No active cardiopulmonary disease. Electronically Signed   By: Earle Gell M.D.   On: 12/18/2015 20:17    Procedures Procedures (including critical care time)  Medications Ordered in ED Medications - No data to display   Initial Impression / Assessment and Plan / ED Course  I have reviewed the triage vital signs and the nursing notes.  Pertinent labs & imaging results that were available during my care of the patient were reviewed by me and considered in my medical decision making (see chart for details).  Clinical Course   Ms. Cortina is a 50 year old female with PMH significant for diabetes, gastroparesis, hypokalemia, stroke, and fibromyalgia.  She presents for chest pain, shortness of breath, and nausea/vomiting.  EKG obtained, demonstrates sinus tachycardia, with no significant changes from prior.  She was given haldol and fentanyl for symptom control which improved her symptoms substantially.  Labs ordered, including CBC, BMP, Ua, lipase, hepatic function panel, and delta troponin. Results significant for increasing troponin (though below threshold), hypokalemia, elevated glucose, leukocytosis.  CXR obtained, personally reviewed by me, demonstrates no active cardiac or pulmonary processes.  Given increasing troponins, patient is  admitted for further serial testing and ACS evaluation.  Final Clinical Impressions(s) / ED Diagnoses   Final diagnoses:  Other chest pain    New Prescriptions New Prescriptions   No medications on file     Elveria Rising, MD 12/19/15 0149    Carmin Muskrat, MD 12/24/15 2219

## 2015-12-18 NOTE — ED Triage Notes (Signed)
Patient reports central chest pain radiating to left shoulder with SOB , emesis and fatigue onset today .

## 2015-12-19 ENCOUNTER — Observation Stay (HOSPITAL_BASED_OUTPATIENT_CLINIC_OR_DEPARTMENT_OTHER): Payer: Medicaid Other

## 2015-12-19 ENCOUNTER — Encounter (HOSPITAL_COMMUNITY): Payer: Self-pay | Admitting: Internal Medicine

## 2015-12-19 DIAGNOSIS — R079 Chest pain, unspecified: Secondary | ICD-10-CM

## 2015-12-19 DIAGNOSIS — I10 Essential (primary) hypertension: Secondary | ICD-10-CM

## 2015-12-19 LAB — URINALYSIS, ROUTINE W REFLEX MICROSCOPIC
Bilirubin Urine: NEGATIVE
Ketones, ur: 15 mg/dL — AB
LEUKOCYTES UA: NEGATIVE
NITRITE: NEGATIVE
PH: 6 (ref 5.0–8.0)
PROTEIN: 100 mg/dL — AB
Specific Gravity, Urine: 1.019 (ref 1.005–1.030)

## 2015-12-19 LAB — CBC WITH DIFFERENTIAL/PLATELET
BASOS ABS: 0 10*3/uL (ref 0.0–0.1)
BASOS PCT: 0 %
EOS PCT: 2 %
Eosinophils Absolute: 0.2 10*3/uL (ref 0.0–0.7)
HCT: 27.5 % — ABNORMAL LOW (ref 36.0–46.0)
Hemoglobin: 8.8 g/dL — ABNORMAL LOW (ref 12.0–15.0)
LYMPHS PCT: 33 %
Lymphs Abs: 3 10*3/uL (ref 0.7–4.0)
MCH: 28.6 pg (ref 26.0–34.0)
MCHC: 32 g/dL (ref 30.0–36.0)
MCV: 89.3 fL (ref 78.0–100.0)
Monocytes Absolute: 0.5 10*3/uL (ref 0.1–1.0)
Monocytes Relative: 6 %
Neutro Abs: 5.5 10*3/uL (ref 1.7–7.7)
Neutrophils Relative %: 59 %
PLATELETS: 307 10*3/uL (ref 150–400)
RBC: 3.08 MIL/uL — AB (ref 3.87–5.11)
RDW: 14.1 % (ref 11.5–15.5)
WBC: 9.2 10*3/uL (ref 4.0–10.5)

## 2015-12-19 LAB — GLUCOSE, CAPILLARY
GLUCOSE-CAPILLARY: 243 mg/dL — AB (ref 65–99)
GLUCOSE-CAPILLARY: 348 mg/dL — AB (ref 65–99)
GLUCOSE-CAPILLARY: 267 mg/dL — AB (ref 65–99)
Glucose-Capillary: 249 mg/dL — ABNORMAL HIGH (ref 65–99)

## 2015-12-19 LAB — BASIC METABOLIC PANEL
Anion gap: 10 (ref 5–15)
BUN: 7 mg/dL (ref 6–20)
CHLORIDE: 98 mmol/L — AB (ref 101–111)
CO2: 28 mmol/L (ref 22–32)
CREATININE: 0.92 mg/dL (ref 0.44–1.00)
Calcium: 8.9 mg/dL (ref 8.9–10.3)
GFR calc Af Amer: >60 mL/min (ref >60)
GFR calc non Af Amer: >60 mL/min (ref >60)
GLUCOSE: 283 mg/dL — AB (ref 65–99)
POTASSIUM: 3.1 mmol/L — AB (ref 3.5–5.1)
Sodium: 136 mmol/L (ref 135–145)

## 2015-12-19 LAB — URINE MICROSCOPIC-ADD ON

## 2015-12-19 LAB — HEPATIC FUNCTION PANEL
ALBUMIN: 3.7 g/dL (ref 3.5–5.0)
ALK PHOS: 97 U/L (ref 38–126)
ALT: 17 U/L (ref 14–54)
AST: 18 U/L (ref 15–41)
Bilirubin, Direct: <0.1 mg/dL — ABNORMAL LOW (ref 0.1–0.5)
TOTAL PROTEIN: 7.6 g/dL (ref 6.5–8.1)
Total Bilirubin: 0.5 mg/dL (ref 0.3–1.2)

## 2015-12-19 LAB — CBG MONITORING, ED: Glucose-Capillary: 278 mg/dL — ABNORMAL HIGH (ref 65–99)

## 2015-12-19 LAB — POTASSIUM: Potassium: 3.1 mmol/L — ABNORMAL LOW (ref 3.5–5.1)

## 2015-12-19 LAB — I-STAT TROPONIN, ED: Troponin i, poc: 0.06 ng/mL (ref 0.00–0.08)

## 2015-12-19 LAB — TROPONIN I

## 2015-12-19 LAB — MAGNESIUM: MAGNESIUM: 1.3 mg/dL — AB (ref 1.7–2.4)

## 2015-12-19 LAB — LIPASE, BLOOD: LIPASE: 23 U/L (ref 11–51)

## 2015-12-19 LAB — CREATININE, SERUM
CREATININE: 1.01 mg/dL — AB (ref 0.44–1.00)
GFR calc Af Amer: >60 mL/min (ref >60)
GFR calc non Af Amer: >60 mL/min (ref >60)

## 2015-12-19 MED ORDER — METOPROLOL SUCCINATE ER 25 MG PO TB24
25.0000 mg | ORAL_TABLET | Freq: Every day | ORAL | Status: DC
  Administered 2015-12-19 – 2015-12-20 (×2): 25 mg via ORAL
  Filled 2015-12-19 (×2): qty 1

## 2015-12-19 MED ORDER — FAMOTIDINE IN NACL 20-0.9 MG/50ML-% IV SOLN
20.0000 mg | Freq: Two times a day (BID) | INTRAVENOUS | Status: DC
  Administered 2015-12-19 – 2015-12-20 (×2): 20 mg via INTRAVENOUS
  Filled 2015-12-19 (×2): qty 50

## 2015-12-19 MED ORDER — AMLODIPINE BESYLATE 10 MG PO TABS
10.0000 mg | ORAL_TABLET | Freq: Every day | ORAL | Status: DC
  Administered 2015-12-19 – 2015-12-20 (×2): 10 mg via ORAL
  Filled 2015-12-19 (×2): qty 1

## 2015-12-19 MED ORDER — PRAVASTATIN SODIUM 20 MG PO TABS
10.0000 mg | ORAL_TABLET | Freq: Every day | ORAL | Status: DC
  Administered 2015-12-19 – 2015-12-20 (×2): 10 mg via ORAL
  Filled 2015-12-19 (×2): qty 1

## 2015-12-19 MED ORDER — FENTANYL CITRATE (PF) 100 MCG/2ML IJ SOLN
100.0000 ug | Freq: Once | INTRAMUSCULAR | Status: AC
  Administered 2015-12-19: 100 ug via INTRAVENOUS
  Filled 2015-12-19: qty 2

## 2015-12-19 MED ORDER — POTASSIUM CHLORIDE IN NACL 40-0.9 MEQ/L-% IV SOLN
INTRAVENOUS | Status: DC
  Administered 2015-12-19: 125 mL/h via INTRAVENOUS
  Filled 2015-12-19 (×2): qty 1000

## 2015-12-19 MED ORDER — MAGNESIUM SULFATE 4 GM/100ML IV SOLN
4.0000 g | Freq: Once | INTRAVENOUS | Status: AC
  Administered 2015-12-19: 4 g via INTRAVENOUS
  Filled 2015-12-19: qty 100

## 2015-12-19 MED ORDER — METOCLOPRAMIDE HCL 10 MG PO TABS
10.0000 mg | ORAL_TABLET | Freq: Three times a day (TID) | ORAL | Status: DC
  Administered 2015-12-19 – 2015-12-20 (×5): 10 mg via ORAL
  Filled 2015-12-19 (×5): qty 1

## 2015-12-19 MED ORDER — DULOXETINE HCL 30 MG PO CPEP
30.0000 mg | ORAL_CAPSULE | Freq: Two times a day (BID) | ORAL | Status: DC
  Administered 2015-12-19 – 2015-12-20 (×2): 30 mg via ORAL
  Filled 2015-12-19 (×3): qty 1

## 2015-12-19 MED ORDER — PROMETHAZINE HCL 25 MG/ML IJ SOLN
6.2500 mg | Freq: Four times a day (QID) | INTRAMUSCULAR | Status: DC | PRN
  Administered 2015-12-19 – 2015-12-20 (×3): 6.25 mg via INTRAVENOUS
  Filled 2015-12-19 (×3): qty 1

## 2015-12-19 MED ORDER — ONDANSETRON HCL 4 MG/2ML IJ SOLN
4.0000 mg | Freq: Four times a day (QID) | INTRAMUSCULAR | Status: DC | PRN
  Administered 2015-12-19 (×2): 4 mg via INTRAVENOUS
  Filled 2015-12-19 (×2): qty 2

## 2015-12-19 MED ORDER — FOLIC ACID 1 MG PO TABS
1.0000 mg | ORAL_TABLET | Freq: Every day | ORAL | Status: DC
  Administered 2015-12-19 – 2015-12-20 (×2): 1 mg via ORAL
  Filled 2015-12-19 (×2): qty 1

## 2015-12-19 MED ORDER — INSULIN ASPART 100 UNIT/ML ~~LOC~~ SOLN
0.0000 [IU] | Freq: Three times a day (TID) | SUBCUTANEOUS | Status: DC
  Administered 2015-12-19: 7 [IU] via SUBCUTANEOUS
  Administered 2015-12-19 – 2015-12-20 (×2): 15 [IU] via SUBCUTANEOUS
  Administered 2015-12-20: 11 [IU] via SUBCUTANEOUS

## 2015-12-19 MED ORDER — SENNA 8.6 MG PO TABS: 1.0000 | ORAL_TABLET | Freq: Every day | ORAL | Status: DC | PRN

## 2015-12-19 MED ORDER — ALBUTEROL SULFATE (2.5 MG/3ML) 0.083% IN NEBU
2.5000 mg | INHALATION_SOLUTION | Freq: Four times a day (QID) | RESPIRATORY_TRACT | Status: DC | PRN
  Administered 2015-12-20: 2.5 mg via RESPIRATORY_TRACT
  Filled 2015-12-19: qty 3

## 2015-12-19 MED ORDER — FENTANYL CITRATE (PF) 100 MCG/2ML IJ SOLN
25.0000 ug | Freq: Once | INTRAMUSCULAR | Status: AC
  Administered 2015-12-19: 25 ug via INTRAVENOUS
  Filled 2015-12-19: qty 2

## 2015-12-19 MED ORDER — REGADENOSON 0.4 MG/5ML IV SOLN
0.4000 mg | Freq: Once | INTRAVENOUS | Status: AC
  Administered 2015-12-19: 0.4 mg via INTRAVENOUS

## 2015-12-19 MED ORDER — FENTANYL CITRATE (PF) 100 MCG/2ML IJ SOLN
25.0000 ug | INTRAMUSCULAR | Status: DC | PRN
  Administered 2015-12-19 – 2015-12-20 (×3): 25 ug via INTRAVENOUS
  Filled 2015-12-19 (×3): qty 2

## 2015-12-19 MED ORDER — METOCLOPRAMIDE HCL 10 MG PO TABS: 10.0000 mg | ORAL_TABLET | Freq: Four times a day (QID) | ORAL | Status: DC | PRN

## 2015-12-19 MED ORDER — INSULIN ASPART PROT & ASPART (70-30 MIX) 100 UNIT/ML ~~LOC~~ SUSP: 50.0000 [IU] | Freq: Every day | SUBCUTANEOUS | Status: DC

## 2015-12-19 MED ORDER — INSULIN ASPART PROT & ASPART (70-30 MIX) 100 UNIT/ML ~~LOC~~ SUSP: 60.0000 [IU] | Freq: Every day | SUBCUTANEOUS | Status: DC

## 2015-12-19 MED ORDER — ENOXAPARIN SODIUM 40 MG/0.4ML ~~LOC~~ SOLN
40.0000 mg | Freq: Every day | SUBCUTANEOUS | Status: DC
  Administered 2015-12-19 – 2015-12-20 (×2): 40 mg via SUBCUTANEOUS
  Filled 2015-12-19 (×3): qty 0.4

## 2015-12-19 MED ORDER — PANTOPRAZOLE SODIUM 40 MG PO TBEC
40.0000 mg | DELAYED_RELEASE_TABLET | Freq: Two times a day (BID) | ORAL | Status: DC
  Administered 2015-12-19: 40 mg via ORAL
  Filled 2015-12-19: qty 1

## 2015-12-19 MED ORDER — SODIUM CHLORIDE 0.9 % IV SOLN
INTRAVENOUS | Status: DC
  Administered 2015-12-19: 1000 mL via INTRAVENOUS
  Administered 2015-12-20: 12:00:00 via INTRAVENOUS

## 2015-12-19 MED ORDER — ALLOPURINOL 100 MG PO TABS
100.0000 mg | ORAL_TABLET | Freq: Every day | ORAL | Status: DC
  Administered 2015-12-19 – 2015-12-20 (×2): 100 mg via ORAL
  Filled 2015-12-19 (×2): qty 1

## 2015-12-19 MED ORDER — FAMOTIDINE IN NACL 20-0.9 MG/50ML-% IV SOLN: 20.0000 mg | Freq: Two times a day (BID) | INTRAVENOUS | Status: DC

## 2015-12-19 MED ORDER — REGADENOSON 0.4 MG/5ML IV SOLN
INTRAVENOUS | Status: AC
  Administered 2015-12-19: 0.4 mg via INTRAVENOUS
  Filled 2015-12-19: qty 5

## 2015-12-19 MED ORDER — INSULIN ASPART PROT & ASPART (70-30 MIX) 100 UNIT/ML ~~LOC~~ SUSP
75.0000 [IU] | Freq: Every day | SUBCUTANEOUS | Status: DC
  Filled 2015-12-19: qty 10

## 2015-12-19 MED ORDER — OXYCODONE HCL 5 MG PO TABS
5.0000 mg | ORAL_TABLET | Freq: Four times a day (QID) | ORAL | Status: DC | PRN
  Administered 2015-12-19 – 2015-12-20 (×3): 5 mg via ORAL
  Filled 2015-12-19 (×4): qty 1

## 2015-12-19 MED ORDER — GI COCKTAIL ~~LOC~~
30.0000 mL | Freq: Once | ORAL | Status: AC
  Administered 2015-12-19: 30 mL via ORAL
  Filled 2015-12-19: qty 30

## 2015-12-19 MED ORDER — SODIUM CHLORIDE 0.9% FLUSH
3.0000 mL | Freq: Two times a day (BID) | INTRAVENOUS | Status: DC
  Administered 2015-12-19 – 2015-12-20 (×2): 3 mL via INTRAVENOUS

## 2015-12-19 MED ORDER — FAMOTIDINE 20 MG PO TABS
20.0000 mg | ORAL_TABLET | Freq: Two times a day (BID) | ORAL | Status: DC
  Administered 2015-12-19: 20 mg via ORAL
  Filled 2015-12-19: qty 1

## 2015-12-19 MED ORDER — NITROGLYCERIN 0.4 MG SL SUBL
0.4000 mg | SUBLINGUAL_TABLET | SUBLINGUAL | Status: DC | PRN
  Administered 2015-12-19 – 2015-12-20 (×6): 0.4 mg via SUBLINGUAL
  Filled 2015-12-19 (×4): qty 1

## 2015-12-19 MED ORDER — TECHNETIUM TC 99M TETROFOSMIN IV KIT
30.0000 | PACK | Freq: Once | INTRAVENOUS | Status: AC | PRN
  Administered 2015-12-19: 30 via INTRAVENOUS

## 2015-12-19 NOTE — Consult Note (Signed)
Tolerated Lexiscan well, final report pending  Kerin Ransom PA-C 12/19/2015 3:27 PM

## 2015-12-19 NOTE — Progress Notes (Signed)
Results for LISHA, VITALE (MRN [id number]) as of 12/19/2015 12:11  Ref. Range 12/19/2015 02:38 12/19/2015 09:50 12/19/2015 11:37  Glucose-Capillary Latest Ref Range: 65 - 99 mg/dL 278 (H) 249 (H) 243 (H)   Noted that blood sugars continue to be greater than 180 mg/dl.  Patient takes 70/30 insulin at home: 75 units every am, 60 units at lunch, and 50 units at dinner. Since patient is not eating well right now, recommend starting Lantus 35 units (weight based; 135 kg X 0.25 units/kg) and titrate as needed. Continue Novolog RESISTANT correction scale TID & HS. Harvel Ricks RN BSN CDE

## 2015-12-19 NOTE — Consult Note (Signed)
CARDIOLOGY CONSULT NOTE   Patient ID: Deborah [name] MRN: [id number] DOB/AGE: 1965-11-26 50 y.o.  Admit date: 12/18/2015  Primary Physician   Nolon Bussing, PA Primary Cardiologist   New, Dr Radford Pax Reason for Consultation   Chest pain Requesting MD: Dr Cathlean Sauer  [id number] Deborah [name] is a 50 y.o. year old female with a history of uncontrolled IDDM diabetes complicated by CKD and gastroparesis, prior CVA, HTN, and fibromyalgia.  D/c 10/04 after admit for chest pain, abd pain, felt 2nd gastroparesis. Ez neg MI, sx improved.   Admit 10/13 for chest pain, cards asked to see.  Deborah [name] has been having the chest pain. It is in her upper chest, generally pressure, but yesterday was stabbing.  She gets SOB w/ exertion, chronic and no recent changes. No LE edema. The chest pain starts with and without exertion. When it is severe, she will come to the hospital. The pain will resolve with narcotics, she does not remember any nitro. The chest pain has gone into her shoulder and went down her arm once.   She was riding in the car when the chest pain started.This time, it was associated with SOB. There was no association with meals.    Past Medical History:  Diagnosis Date  . Chest pain 12/2015  . Diabetes mellitus   . Fibromyalgia   . Gastric ulcer   . Gastroparesis   . Hypertension   . Obesity   . Pneumonia   . Stroke [facility]) 02/2011    Past Surgical History:  Procedure Laterality Date  . CATARACT EXTRACTION  01/2014  . CHOLECYSTECTOMY     Allergies  Allergen Reactions  . Lisinopril Anaphylaxis    Tongue and mouth swelling  . Penicillins Palpitations    Has patient had a PCN reaction causing immediate rash, facial/tongue/throat swelling, SOB or lightheadedness with hypotension: Yes Has patient had a PCN reaction causing severe rash involving mucus membranes or skin necrosis: No Has patient had a PCN reaction that required hospitalization: Yes  Has patient  had a PCN reaction occurring within the last 10 years: No   . Valium [Diazepam] [name]  . Aspirin Other (See Comments)    Irritates stomach ulcer   . Food Hives and Swelling    Carrots, ketchup   . Nsaids Other (See Comments)    ULCER  . Tylenol [Acetaminophen] Other (See Comments)    Irritates stomach ulcer   . Morphine And Related Palpitations    I have reviewed the patient's current medications . allopurinol  100 mg Oral Daily  . amLODipine  10 mg Oral Daily  . DULoxetine  30 mg Oral BID  . enoxaparin (LOVENOX) injection  40 mg Subcutaneous Daily  . famotidine  20 mg Oral BID  . folic acid  1 mg Oral Daily  . insulin aspart  0-20 Units Subcutaneous TID WC  . metoCLOPramide  10 mg Oral TID AC & HS  . metoprolol succinate  25 mg Oral Daily  . pantoprazole  40 mg Oral BID  . pravastatin  10 mg Oral Daily  . sodium chloride flush  3 mL Intravenous Q12H   . 0.9 % NaCl with KCl 40 mEq / L 125 mL/hr (12/19/15 0645)   albuterol, metoCLOPramide, nitroGLYCERIN, ondansetron (ZOFRAN) IV, oxyCODONE, senna  Prior to Admission medications   Medication Sig Start Date End Date Taking? Authorizing Provider  albuterol (PROVENTIL HFA;VENTOLIN HFA) 108 (90 Base) MCG/ACT inhaler Inhale 2 puffs into the lungs  every 6 (six) hours as needed for wheezing or shortness of breath. 09/23/15  Yes Debbe Odea, MD  allopurinol (ZYLOPRIM) 100 MG tablet Take 1 tablet (100 mg total) by mouth daily. 09/23/15  Yes Debbe Odea, MD  amLODipine (NORVASC) 10 MG tablet Take 1 tablet (10 mg total) by mouth daily. 09/23/15  Yes Debbe Odea, MD  DULoxetine (CYMBALTA) 30 MG capsule Take 1 capsule (30 mg total) by mouth 2 (two) times daily. 09/23/15  Yes Debbe Odea, MD  metoCLOPramide (REGLAN) 10 MG tablet Take 1 tablet (10 mg total) by mouth 4 (four) times daily -  before meals and at bedtime. Take 30 min prior to eating 09/23/15  Yes Debbe Odea, MD  metoCLOPramide (REGLAN) 10 MG tablet Take 1 tablet (10  mg total) by mouth every 6 (six) hours as needed for nausea or vomiting. 12/10/15  Yes Mir Marry Guan, MD  metoprolol succinate (TOPROL-XL) 25 MG 24 hr tablet Take 1 tablet (25 mg total) by mouth daily. 09/23/15  Yes Debbe Odea, MD  nitroGLYCERIN (NITROSTAT) 0.4 MG SL tablet Place 0.4 mg under the tongue every 5 (five) minutes as needed for chest pain.   Yes Historical Provider, MD  NOVOLOG MIX 70/30 (70-30) 100 UNIT/ML injection Inject 0.5-0.75 mLs (50-75 Units total) into the skin 3 (three) times daily after meals. 75 units every morning, 60 units with lunch, and 50 units with dinner 09/23/15  Yes Debbe Odea, MD  oxyCODONE (OXY IR/ROXICODONE) 5 MG immediate release tablet Take 1 tablet (5 mg total) by mouth every 6 (six) hours as needed for moderate pain. 12/09/15  Yes Hosie Poisson, MD  pantoprazole (PROTONIX) 40 MG tablet Take 1 tablet (40 mg total) by mouth 2 (two) times daily. 09/23/15  Yes Debbe Odea, MD  pravastatin (PRAVACHOL) 10 MG tablet Take 1 tablet (10 mg total) by mouth daily. 09/23/15  Yes Debbe Odea, MD  ranitidine (ZANTAC) 150 MG tablet Take 1 tablet (150 mg total) by mouth daily as needed for heartburn. Patient taking differently: Take 150 mg by mouth 3 (three) times daily as needed for heartburn.  09/23/15  Yes Debbe Odea, MD  senna (SENOKOT) 8.6 MG tablet Take 1 tablet (8.6 mg total) by mouth 2 (two) times daily. Patient taking differently: Take 1 tablet by mouth daily as needed for constipation.  09/23/15 09/22/16 Yes Debbe Odea, MD  Vitamin D, Ergocalciferol, (DRISDOL) 50000 units CAPS capsule Take 1 capsule (50,000 Units total) by mouth every 7 (seven) days. Patient taking differently: Take 50,000 Units by mouth every 30 (thirty) days.  09/23/15  Yes Debbe Odea, MD  folic acid (FOLVITE) 1 MG tablet Take 1 tablet (1 mg total) by mouth daily. Patient not taking: Reported on 12/19/2015 12/10/15   Mir Marry Guan, MD     Social History   Social History  .  Marital status: Single    Spouse name: N/A  . Number of children: N/A  . Years of education: N/A   Occupational History  . Not on file.   Social History Main Topics  . Smoking status: Never Smoker  . Smokeless tobacco: Never Used  . Alcohol use No  . Drug use: No  . Sexual activity: Not Currently    Birth control/ protection: None   Other Topics Concern  . Not on file   Social History Narrative   ** Merged History Encounter **        Family Status  Relation Status  . Mother   . Father   .  Sister Deceased  . Brother    Family History  Problem Relation Age of Onset  . Diabetes Mother   . Diabetes Father   . Heart disease Father   . Diabetes Sister   . Congestive Heart Failure Sister 46  . Diabetes Brother      ROS:  Full 14 point review of systems complete and found to be negative unless listed above.  Physical Exam: Blood pressure (!) 142/77, pulse 100, temperature 98.7 F (37.1 C), temperature source Oral, resp. rate 18, height 5\' 6"  (1.676 m), weight 298 lb 12.8 oz (135.5 kg), last menstrual period 10/10/2012, SpO2 100 %.  General: Well developed, well nourished, female in no acute distress Head: Eyes PERRLA, No xanthomas.   Normocephalic and atraumatic, oropharynx without edema or exudate. Dentition:  Lungs:  Heart: HRRR S1 S2, no rub/gallop, Heart irregular rate and rhythm with S1, S2  murmur. pulses are 2+ all 4 extrem.   Neck: No carotid bruits. No lymphadenopathy.  JVD not elevated Abdomen: Bowel sounds present, abdomen soft and non-tender without masses or hernias noted. Msk:  No spine or cva tenderness. No weakness, no joint deformities or effusions. Extremities: No clubbing or cyanosis. No edema.  Neuro: Alert and oriented X 3. No focal deficits noted. Psych:  Good affect, responds appropriately Skin: No rashes or lesions noted.  Labs:   Lab Results  Component Value Date   WBC 9.2 12/19/2015   HGB 8.8 (L) 12/19/2015   HCT 27.5 (L) 12/19/2015    MCV 89.3 12/19/2015   PLT 307 12/19/2015   No results for input(s): INR in the last 72 hours.   Recent Labs Lab 12/18/15 1949 12/19/15 0811 12/19/15 0818  NA 135  --   --   K 2.8*  --  3.1*  CL 96*  --   --   CO2 27  --   --   BUN 9  --   --   CREATININE 1.13* 1.01*  --   CALCIUM 8.9  --   --   PROT 7.6  --   --   BILITOT 0.5  --   --   ALKPHOS 97  --   --   ALT 17  --   --   AST 18  --   --   GLUCOSE 307*  --   --   ALBUMIN 3.7  --   --    Magnesium  Date Value Ref Range Status  12/19/2015 1.3 (L) 1.7 - 2.4 mg/dL Final    Recent Labs  12/19/15 0811  TROPONINI <0.03    Recent Labs  12/18/15 1948 12/19/15 0043  TROPIPOC 0.02 0.06   B Natriuretic Peptide  Date/Time Value Ref Range Status  08/06/2014 01:18 PM 17.9 0.0 - 100.0 pg/mL Final   Lipase  Date/Time Value Ref Range Status  12/18/2015 07:49 PM 23 11 - 51 U/L Final   TSH  Date/Time Value Ref Range Status  05/26/2015 01:45 PM 0.863 0.350 - 4.500 uIU/mL Final  09/27/2013 04:56 PM 0.17 (L) 0.35 - 4.50 uIU/mL Final   T4, Total  Date/Time Value Ref Range Status  10/01/2013 09:55 AM 8.7 5.0 - 12.5 ug/dL Final   Vitamin B-12  Date/Time Value Ref Range Status  12/09/2015 05:21 AM 261 180 - 914 pg/mL Final    Comment:    (NOTE) This assay is not validated for testing neonatal or myeloproliferative syndrome specimens for Vitamin B12 levels. Performed at [facility]    Folate  Date/Time Value Ref Range Status  12/09/2015 05:21 AM 4.8 (L) >5.9 ng/mL Final    Comment:    Performed at [facility]   Ferritin  Date/Time Value Ref Range Status  12/09/2015 05:21 AM 145 11 - 307 ng/mL Final    Comment:    Performed at [facility]   TIBC  Date/Time Value Ref Range Status  12/09/2015 05:21 AM 323 250 - 450 ug/dL Final   Iron  Date/Time Value Ref Range Status  12/09/2015 05:21 AM 43 28 - 170 ug/dL Final   Retic Ct Pct  Date/Time Value Ref Range Status  12/09/2015 05:21 AM  4.0 (H) 0.4 - 3.1 % Final   ECG:  10/12 SR, No acute changes  Radiology:  Dg Chest 2 View  Result Date: 12/18/2015 CLINICAL DATA:  Left-sided chest pain with shortness of breath beginning this morning. Hypertension and diabetes. EXAM: CHEST  2 VIEW COMPARISON:  09/26/2015 FINDINGS: The heart size and mediastinal contours are within normal limits. Both lungs are clear. The visualized skeletal structures are unremarkable. IMPRESSION: No active cardiopulmonary disease. Electronically Signed   By: Earle Gell M.D.   On: 12/18/2015 20:17    ASSESSMENT AND PLAN:   The patient was seen today by Dr Radford Pax, the patient evaluated and the data reviewed.  Principal Problem:   Chest pain - ez neg MI, sx not consistent w/ exertion and ECG not acute. - However, she has multiple CRFs, ischemic eval indicated. Carlton Adam MV will be performed, 2 day study.  Otherwise, per IM Active Problems:   Essential hypertension, benign   Leukocytosis   Hypokalemia   Hypomagnesemia   Nausea and vomiting   DM (diabetes mellitus), type 2, uncontrolled ([place])   Signed: Rosaria Ferries, PA-C 12/19/2015 11:36 AM Beeper [phone]  Co-Sign MD

## 2015-12-19 NOTE — ED Notes (Addendum)
Called report to [facility]; patient's active 4/10 chest pain contraindicated for this unit; charge nurses called; MD aware of pain; pain med requested.

## 2015-12-19 NOTE — Progress Notes (Signed)
Patient complaining of 10/10 Chest pain and Nausea. 3 Nitro given along with Zofran. She wanted to stay on the toilet.  1st Nitro: BP 153/103 HR 110 2nd Nitro: BP 152/95 HR 119 3rd Nitro: BP 116/76 HR 120  Patient assisted back to bed and given Fentanyl. EKG obtained. BP 130/88 HR 119, CBG 267. On call Cardiologist made aware. Continue current treatment. Will continue to monitor.

## 2015-12-19 NOTE — ED Notes (Signed)
Dr. Olevia Bowens in to speak with patient.

## 2015-12-19 NOTE — H&P (Signed)
History and Physical    Deborah [name] SNK:[id number] DOB: 22-May-1965 DOA: 12/18/2015  PCP: Nolon Bussing, PA   Patient coming from: Home.  Chief Complaint: Chest pain.  HPI: Deborah [name] is a 50 y.o. female with medical history significant of type 2 diabetes, gastroparesis, PUD, fibromyalgia, hypertension, morbid obesity, stroke who is coming to the emergency department due to chest pain.  Per patient, and she started having precordial chest pain radiates to her shoulders, without relieving or worsening factors, which has been present since yesterday after she woke up. The pain is associated with abdominal pain, nausea and emesis. She denies dyspnea, palpitations, diaphoresis, dizziness, PND, orthopnea or recent lower extremity edema.  ED Course: EKG had no new changes and troponin levels have been negative. Lab work shows a hemoglobin level of 10.5 g/dL, potassium of 2.8 mmol/liter, magnesium of 1.3 and glucose of 307 mg/dL. Chest radiograph did not show any acute cardiopulmonary pathology.  Review of Systems: As per HPI otherwise 10 point review of systems negative.    Past Medical History:  Diagnosis Date  . Diabetes mellitus   . Fibromyalgia   . Gastric ulcer   . Gastroparesis   . Hypertension   . Obesity   . Pneumonia   . Stroke ([place]) 02/2013  . Stroke [facility])     Past Surgical History:  Procedure Laterality Date  . CATARACT EXTRACTION  01/2014  . CHOLECYSTECTOMY       reports that she has never smoked. She has never used smokeless tobacco. She reports that she does not drink alcohol or use drugs.  Allergies  Allergen Reactions  . Lisinopril Anaphylaxis    Tongue and mouth swelling  . Penicillins Palpitations    Has patient had a PCN reaction causing immediate rash, facial/tongue/throat swelling, SOB or lightheadedness with hypotension: Yes Has patient had a PCN reaction causing severe rash involving mucus membranes or skin necrosis: No Has  patient had a PCN reaction that required hospitalization: Yes  Has patient had a PCN reaction occurring within the last 10 years: No   . Valium [Diazepam] [name]  . Aspirin Other (See Comments)    Irritates stomach ulcer   . Food Hives and Swelling    Carrots, ketchup   . Nsaids Other (See Comments)    ULCER  . Tylenol [Acetaminophen] Other (See Comments)    Irritates stomach ulcer   . Morphine And Related Palpitations    Family History  Problem Relation Age of Onset  . Diabetes Mother   . Diabetes Father   . Heart disease Father   . Diabetes Sister   . Diabetes Brother     Prior to Admission medications   Medication Sig Start Date End Date Taking? Authorizing Provider  albuterol (PROVENTIL HFA;VENTOLIN HFA) 108 (90 Base) MCG/ACT inhaler Inhale 2 puffs into the lungs every 6 (six) hours as needed for wheezing or shortness of breath. 09/23/15   Debbe Odea, MD  allopurinol (ZYLOPRIM) 100 MG tablet Take 1 tablet (100 mg total) by mouth daily. 09/23/15   Debbe Odea, MD  amLODipine (NORVASC) 10 MG tablet Take 1 tablet (10 mg total) by mouth daily. 09/23/15   Debbe Odea, MD  DULoxetine (CYMBALTA) 30 MG capsule Take 1 capsule (30 mg total) by mouth 2 (two) times daily. 09/23/15   Debbe Odea, MD  folic acid (FOLVITE) 1 MG tablet Take 1 tablet (1 mg total) by mouth daily. 12/10/15   Mir Marry Guan, MD  metoCLOPramide (REGLAN) 10 MG  tablet Take 1 tablet (10 mg total) by mouth 4 (four) times daily -  before meals and at bedtime. Take 30 min prior to eating 09/23/15   Debbe Odea, MD  metoCLOPramide (REGLAN) 10 MG tablet Take 1 tablet (10 mg total) by mouth every 6 (six) hours as needed for nausea or vomiting. 12/10/15   Mir Marry Guan, MD  metoprolol succinate (TOPROL-XL) 25 MG 24 hr tablet Take 1 tablet (25 mg total) by mouth daily. 09/23/15   Debbe Odea, MD  nitroGLYCERIN (NITROSTAT) 0.4 MG SL tablet Place 0.4 mg under the tongue every 5 (five) minutes as  needed for chest pain.    Historical Provider, MD  NOVOLOG MIX 70/30 (70-30) 100 UNIT/ML injection Inject 0.5-0.75 mLs (50-75 Units total) into the skin 3 (three) times daily after meals. 75 units every morning, 60 units with lunch, and 50 units with dinner 09/23/15   Debbe Odea, MD  oxyCODONE (OXY IR/ROXICODONE) 5 MG immediate release tablet Take 1 tablet (5 mg total) by mouth every 6 (six) hours as needed for moderate pain. 12/09/15   Hosie Poisson, MD  pantoprazole (PROTONIX) 40 MG tablet Take 1 tablet (40 mg total) by mouth 2 (two) times daily. 09/23/15   Debbe Odea, MD  pravastatin (PRAVACHOL) 10 MG tablet Take 1 tablet (10 mg total) by mouth daily. 09/23/15   Debbe Odea, MD  ranitidine (ZANTAC) 150 MG tablet Take 1 tablet (150 mg total) by mouth daily as needed for heartburn. Patient taking differently: Take 150 mg by mouth 3 (three) times daily as needed for heartburn.  09/23/15   Debbe Odea, MD  senna (SENOKOT) 8.6 MG tablet Take 1 tablet (8.6 mg total) by mouth 2 (two) times daily. Patient taking differently: Take 1 tablet by mouth daily as needed for constipation.  09/23/15 09/22/16  Debbe Odea, MD  Vitamin D, Ergocalciferol, (DRISDOL) 50000 units CAPS capsule Take 1 capsule (50,000 Units total) by mouth every 7 (seven) days. Patient taking differently: Take 50,000 Units by mouth every 30 (thirty) days.  09/23/15   Debbe Odea, MD    Physical Exam:  Constitutional: NAD, calm, comfortable Vitals:   12/18/15 2200 12/18/15 2230 12/18/15 2300 12/19/15 0030  BP: 126/72 133/77 97/63 110/72  Pulse: 108 105 96 97  Resp:  16 15 24   Temp:      TempSrc:      SpO2: 99% 95% 94% 94%  Weight:      Height:       Eyes: PERRL, lids and conjunctivae normal ENMT: Mucous membranes are moist. Posterior pharynx clear of any exudate or lesions. Neck: normal, supple, no masses, no thyromegaly Respiratory: clear to auscultation bilaterally, no wheezing, no crackles. Normal respiratory effort. No  accessory muscle use.  Cardiovascular: Regular rate and rhythm, no murmurs / rubs / gallops. No extremity edema. 2+ pedal pulses. No carotid bruits.  Abdomen: Obese. Bowel sounds positive. Soft, Mild epigastric tenderness, no masses palpated. No hepatosplenomegaly.  Musculoskeletal: no clubbing / cyanosis. No joint deformity upper and lower extremities. Good ROM, no contractures. Normal muscle tone.  Skin: no rashes, lesions, ulcers. No induration Neurologic: CN 2-12 grossly intact. Sensation intact, DTR normal. Strength 5/5 in all 4.  Psychiatric: Normal judgment and insight. Alert and oriented x 4. Normal mood.    Labs on Admission: I have personally reviewed following labs and imaging studies  CBC:  Recent Labs Lab 12/18/15 1949  WBC 10.7*  HGB 10.5*  HCT 32.2*  MCV 89.2  PLT 358  Basic Metabolic Panel:  Recent Labs Lab 12/18/15 1949  NA 135  K 2.8*  CL 96*  CO2 27  GLUCOSE 307*  BUN 9  CREATININE 1.13*  CALCIUM 8.9   GFR: Estimated Creatinine Clearance: 84.9 mL/min (by C-G formula based on SCr of 1.13 mg/dL (H)). Liver Function Tests:  Recent Labs Lab 12/18/15 1949  AST 18  ALT 17  ALKPHOS 97  BILITOT 0.5  PROT 7.6  ALBUMIN 3.7    Recent Labs Lab 12/18/15 1949  LIPASE 23   No results for input(s): AMMONIA in the last 168 hours. Coagulation Profile: No results for input(s): INR, PROTIME in the last 168 hours. Cardiac Enzymes: No results for input(s): CKTOTAL, CKMB, CKMBINDEX, TROPONINI in the last 168 hours. BNP (last 3 results) No results for input(s): PROBNP in the last 8760 hours. HbA1C: No results for input(s): HGBA1C in the last 72 hours. CBG: No results for input(s): GLUCAP in the last 168 hours. Lipid Profile: No results for input(s): CHOL, HDL, LDLCALC, TRIG, CHOLHDL, LDLDIRECT in the last 72 hours. Thyroid Function Tests: No results for input(s): TSH, T4TOTAL, FREET4, T3FREE, THYROIDAB in the last 72 hours. Anemia Panel: No results  for input(s): VITAMINB12, FOLATE, FERRITIN, TIBC, IRON, RETICCTPCT in the last 72 hours. Urine analysis:    Component Value Date/Time   COLORURINE YELLOW 12/05/2015 1518   APPEARANCEUR CLOUDY (A) 12/05/2015 1518   LABSPEC 1.027 12/05/2015 1518   PHURINE 5.5 12/05/2015 1518   GLUCOSEU >1000 (A) 12/05/2015 1518   HGBUR TRACE (A) 12/05/2015 1518   BILIRUBINUR NEGATIVE 12/05/2015 1518   KETONESUR NEGATIVE 12/05/2015 1518   PROTEINUR 30 (A) 12/05/2015 1518   UROBILINOGEN 0.2 10/02/2013 2108   NITRITE NEGATIVE 12/05/2015 1518   LEUKOCYTESUR NEGATIVE 12/05/2015 1518   Radiological Exams on Admission: Dg Chest 2 View  Result Date: 12/18/2015 CLINICAL DATA:  Left-sided chest pain with shortness of breath beginning this morning. Hypertension and diabetes. EXAM: CHEST  2 VIEW COMPARISON:  09/26/2015 FINDINGS: The heart size and mediastinal contours are within normal limits. Both lungs are clear. The visualized skeletal structures are unremarkable. IMPRESSION: No active cardiopulmonary disease. Electronically Signed   By: Earle Gell M.D.   On: 12/18/2015 20:17  Echocardiogram 12/07/2015 ------------------------------------------------------------------- LV EF: 50% -   55%  ------------------------------------------------------------------- Indications:      Chest pain 786.51.  ------------------------------------------------------------------- History:   PMH:   Stroke.  Risk factors:  Hypertension. Diabetes mellitus. Morbidly obese.  ------------------------------------------------------------------- Study Conclusions  - Left ventricle: The cavity size was normal. Wall thickness was   increased in a pattern of moderate LVH. Systolic function was   normal. The estimated ejection fraction was in the range of 50%   to 55%. Wall motion was normal; there were no regional wall   motion abnormalities. Features are consistent with a pseudonormal   left ventricular filling pattern, with  concomitant abnormal   relaxation and increased filling pressure (grade 2 diastolic   dysfunction). - Mitral valve: There was trivial regurgitation. - Tricuspid valve: There was physiologic regurgitation. - Pulmonary arteries: Systolic pressure could not be accurately   estimated. - Pericardium, extracardiac: There was no pericardial effusion.  Impressions:  - Moderate LVH with LVEF 50-55%. Grade 2 diastolic dysfunction with   increased LV filling pressure. Trivial mitral regurgitation.  EKG: Independently reviewed. Vent. rate 111 BPM PR interval 166 ms QRS duration 80 ms QT/QTc 352/478 ms P-R-T axes 36 -31 19 Sinus tachycardia Left axis deviation Left axis deviation T wave abnormality Abnormal ekg  Not significant change since previous ECG, except for higher rate.  Assessment/Plan Principal Problem:   Chest pain Pain is atypical. Admit to telemetry/observation. Continue supplemental oxygen as needed. Continue nitroglycerin as needed. Check a.m. EKG. Trend troponin levels.  Active Problems:   Essential hypertension, benign  Continue amlodipine 10 mg by mouth daily. Continue metoprolol 25 mg by mouth daily. Monitor blood pressure periodically.    Leukocytosis Afebrile at this time. Chest radiograph and urinalysis do not point towards ID etiology. Follow-up CBC in a.m.    Hypokalemia Continue potassium replacement. Follow-up potassium level.    Hypomagnesemia Replaced.    Nausea and vomiting Likely gastroparesis. Continue oral metoclopramide and when necessary IV Zofran. Keep nothing by mouth overnight. Clear liquid diet trial in a.m.    DM (diabetes mellitus), type 2, uncontrolled ([facility]) Hold long-acting insulin while nothing by mouth or clear liquids. Continue CBG monitoring with regular insulin sliding scale.     DVT prophylaxis: Lovenox SQ. Code Status: Full code. Family Communication:  Disposition Plan: Admit for cardiac monitoring and  troponin levels trending. Consults called:  Admission status: Observation/telemetry.   Reubin Milan MD [facility] Pager [phone].  If 7PM-7AM, please contact night-coverage [email or web address] Password [email or web address]  12/19/2015, 1:55 AM

## 2015-12-19 NOTE — Progress Notes (Signed)
PROGRESS NOTE    Deborah [name]  ZOX:[id number] DOB: Nov 29, 1965 DOA: 12/18/2015 PCP: Nolon Bussing, PA    Brief Narrative: 50 yo female with history of T2dm, gastroparesis, fibromyalgia. Presents with precordial chest pain, constant for the last 3 days, associated with abdominal pain, nausea and vomiting.    Assessment & Plan:   Principal Problem:   Chest pain Active Problems:   Essential hypertension, benign   Leukocytosis   Hypokalemia   Hypomagnesemia   Nausea and vomiting   DM (diabetes mellitus), type 2, uncontrolled ([place])   1. Atypical chest pain. Cardiac enzymes negative, ekg personally reviewed noted left axis deviation with sinus tachycardia. Will continue blood pressure control with amlodipine and metoprolol.  2. Gastritis. Severe pain, requiring IV narcotics, will continue IV fluids and supportive care. Will add famotidine IV for antiacid therapy. GI cocktail, phenergan, zofran.   3. T2DM. Will continue glucose cover and monitoring with insulin sliding scale. Serum glucose [phone]. Patient with poor oral intake, will continue with clear liquid diet.   4. Hypokalemia/ hypomagnesemia. Electrolyte disturbances due to GI losses, will continue repletion, check bmp this pm, follow on magnesium levels.   5. Dyslipidemia. Continue pravastatin.     DVT prophylaxis: enoxaparin  Code Status: full  Family Communication: No family at the bedside  Disposition Plan: home   Consultants:     Procedures:   Antimicrobials:   Subjective: Patient with abdominal/ epigastric pain, dull in nature, moderate to severe in intensity, radiated to the chest, associated with nausea and vomiting, no diarrhea.   Objective: Vitals:   12/19/15 0700 12/19/15 0730 12/19/15 0800 12/19/15 0845  BP: 122/71 126/74 111/75 (!) 142/77  Pulse:   96 100  Resp: 17 22 20 18   Temp:    98.7 F (37.1 C)  TempSrc:    Oral  SpO2:   96% 100%  Weight:    135.5 kg (298 lb 12.8 oz)    Height:    5\' 6"  (1.676 m)    Intake/Output Summary (Last 24 hours) at 12/19/15 1359 Last data filed at 12/19/15 0215  Gross per 24 hour  Intake             1000 ml  Output                0 ml  Net             1000 ml   Filed Weights   12/18/15 1938 12/19/15 0845  Weight: (!) 139.3 kg (307 lb) 135.5 kg (298 lb 12.8 oz)    Examination:  General exam: ill looking appearing and in pain. E ENT: no conjunctival pallor, oral mucosa moist.  Respiratory system: Mild decreased breath sounds at bases. Respiratory effort normal. Cardiovascular system: S1 & S2 heard, RRR. No JVD, murmurs, rubs, gallops or clicks. No pedal edema. Gastrointestinal system: Abdomen is distended and tender in the epigastric region. No organomegaly or masses felt. Normal bowel sounds heard. Central nervous system: Alert and oriented. No focal neurological deficits. Extremities: Symmetric 5 x 5 power. Skin: No rashes, lesions or ulcers    Data Reviewed: I have personally reviewed following labs and imaging studies  CBC:  Recent Labs Lab 12/18/15 1949 12/19/15 0811  WBC 10.7* 9.2  NEUTROABS  --  5.5  HGB 10.5* 8.8*  HCT 32.2* 27.5*  MCV 89.2 89.3  PLT 358 956   Basic Metabolic Panel:  Recent Labs Lab 12/18/15 1949 12/19/15 0232 12/19/15 0811 12/19/15 0818  NA 135  --   --   --  K 2.8*  --   --  3.1*  CL 96*  --   --   --   CO2 27  --   --   --   GLUCOSE 307*  --   --   --   BUN 9  --   --   --   CREATININE 1.13*  --  1.01*  --   CALCIUM 8.9  --   --   --   MG  --  1.3*  --   --    GFR: Estimated Creatinine Clearance: 93.4 mL/min (by C-G formula based on SCr of 1.01 mg/dL (H)). Liver Function Tests:  Recent Labs Lab 12/18/15 1949  AST 18  ALT 17  ALKPHOS 97  BILITOT 0.5  PROT 7.6  ALBUMIN 3.7    Recent Labs Lab 12/18/15 1949  LIPASE 23   No results for input(s): AMMONIA in the last 168 hours. Coagulation Profile: No results for input(s): INR, PROTIME in the last 168  hours. Cardiac Enzymes:  Recent Labs Lab 12/19/15 0811  TROPONINI <0.03   BNP (last 3 results) No results for input(s): PROBNP in the last 8760 hours. HbA1C: No results for input(s): HGBA1C in the last 72 hours. CBG:  Recent Labs Lab 12/19/15 0238 12/19/15 0950 12/19/15 1137  GLUCAP 278* 249* 243*   Lipid Profile: No results for input(s): CHOL, HDL, LDLCALC, TRIG, CHOLHDL, LDLDIRECT in the last 72 hours. Thyroid Function Tests: No results for input(s): TSH, T4TOTAL, FREET4, T3FREE, THYROIDAB in the last 72 hours. Anemia Panel: No results for input(s): VITAMINB12, FOLATE, FERRITIN, TIBC, IRON, RETICCTPCT in the last 72 hours. Sepsis Labs: No results for input(s): PROCALCITON, LATICACIDVEN in the last 168 hours.  No results found for this or any previous visit (from the past 240 hour(s)).       Radiology Studies: Dg Chest 2 View  Result Date: 12/18/2015 CLINICAL DATA:  Left-sided chest pain with shortness of breath beginning this morning. Hypertension and diabetes. EXAM: CHEST  2 VIEW COMPARISON:  09/26/2015 FINDINGS: The heart size and mediastinal contours are within normal limits. Both lungs are clear. The visualized skeletal structures are unremarkable. IMPRESSION: No active cardiopulmonary disease. Electronically Signed   By: Earle Gell M.D.   On: 12/18/2015 20:17        Scheduled Meds: . allopurinol  100 mg Oral Daily  . amLODipine  10 mg Oral Daily  . DULoxetine  30 mg Oral BID  . enoxaparin (LOVENOX) injection  40 mg Subcutaneous Daily  . famotidine  20 mg Oral BID  . folic acid  1 mg Oral Daily  . insulin aspart  0-20 Units Subcutaneous TID WC  . metoCLOPramide  10 mg Oral TID AC & HS  . metoprolol succinate  25 mg Oral Daily  . pantoprazole  40 mg Oral BID  . pravastatin  10 mg Oral Daily  . sodium chloride flush  3 mL Intravenous Q12H   Continuous Infusions: . 0.9 % NaCl with KCl 40 mEq / L 125 mL/hr (12/19/15 0645)     LOS: 0 days        Mauricio Gerome Apley, MD [facility] Pager [phone]  If 7PM-7AM, please contact night-coverage [email or web address] Password [email or web address] 12/19/2015, 1:59 PM

## 2015-12-20 ENCOUNTER — Observation Stay (HOSPITAL_COMMUNITY): Payer: Self-pay

## 2015-12-20 DIAGNOSIS — R0789 Other chest pain: Secondary | ICD-10-CM

## 2015-12-20 DIAGNOSIS — E7800 Pure hypercholesterolemia, unspecified: Secondary | ICD-10-CM

## 2015-12-20 DIAGNOSIS — E876 Hypokalemia: Secondary | ICD-10-CM

## 2015-12-20 LAB — CBC WITH DIFFERENTIAL/PLATELET
BASOS ABS: 0 10*3/uL (ref 0.0–0.1)
Basophils Relative: 0 %
Eosinophils Absolute: 0 10*3/uL (ref 0.0–0.7)
Eosinophils Relative: 0 %
HEMATOCRIT: 32.3 % — AB (ref 36.0–46.0)
HEMOGLOBIN: 10.4 g/dL — AB (ref 12.0–15.0)
LYMPHS PCT: 13 %
Lymphs Abs: 1.4 10*3/uL (ref 0.7–4.0)
MCH: 28.5 pg (ref 26.0–34.0)
MCHC: 32.2 g/dL (ref 30.0–36.0)
MCV: 88.5 fL (ref 78.0–100.0)
MONO ABS: 0.3 10*3/uL (ref 0.1–1.0)
Monocytes Relative: 3 %
NEUTROS ABS: 9.4 10*3/uL — AB (ref 1.7–7.7)
NEUTROS PCT: 84 %
Platelets: 350 10*3/uL (ref 150–400)
RBC: 3.65 MIL/uL — AB (ref 3.87–5.11)
RDW: 13.9 % (ref 11.5–15.5)
WBC: 11.1 10*3/uL — ABNORMAL HIGH (ref 4.0–10.5)

## 2015-12-20 LAB — BASIC METABOLIC PANEL
ANION GAP: 11 (ref 5–15)
BUN: 5 mg/dL — ABNORMAL LOW (ref 6–20)
CHLORIDE: 96 mmol/L — AB (ref 101–111)
CO2: 28 mmol/L (ref 22–32)
Calcium: 8.8 mg/dL — ABNORMAL LOW (ref 8.9–10.3)
Creatinine, Ser: 0.95 mg/dL (ref 0.44–1.00)
GFR calc non Af Amer: >60 mL/min (ref >60)
GLUCOSE: 331 mg/dL — AB (ref 65–99)
POTASSIUM: 3.3 mmol/L — AB (ref 3.5–5.1)
Sodium: 135 mmol/L (ref 135–145)

## 2015-12-20 LAB — GLUCOSE, CAPILLARY
GLUCOSE-CAPILLARY: 323 mg/dL — AB (ref 65–99)
Glucose-Capillary: 257 mg/dL — ABNORMAL HIGH (ref 65–99)
Glucose-Capillary: 234 mg/dL — ABNORMAL HIGH (ref 65–99)

## 2015-12-20 LAB — NM MYOCAR MULTI W/SPECT W/WALL MOTION / EF
CHL CUP RESTING HR STRESS: 104 {beats}/min
Exercise duration (min): 4 min

## 2015-12-20 LAB — MAGNESIUM: Magnesium: 1.4 mg/dL — ABNORMAL LOW (ref 1.7–2.4)

## 2015-12-20 MED ORDER — POTASSIUM CHLORIDE 10 MEQ/100ML IV SOLN
10.0000 meq | INTRAVENOUS | Status: AC
  Administered 2015-12-20 (×2): 10 meq via INTRAVENOUS
  Filled 2015-12-20 (×2): qty 100

## 2015-12-20 MED ORDER — MAGNESIUM SULFATE 2 GM/50ML IV SOLN
2.0000 g | Freq: Once | INTRAVENOUS | Status: AC
  Administered 2015-12-20: 2 g via INTRAVENOUS
  Filled 2015-12-20: qty 50

## 2015-12-20 MED ORDER — OXYCODONE HCL 5 MG PO TABS: 5.0000 mg | ORAL_TABLET | Freq: Three times a day (TID) | ORAL | 0 refills | Status: DC | PRN

## 2015-12-20 MED ORDER — GI COCKTAIL ~~LOC~~
30.0000 mL | Freq: Once | ORAL | Status: AC
  Administered 2015-12-20: 30 mL via ORAL
  Filled 2015-12-20: qty 30

## 2015-12-20 MED ORDER — FAMOTIDINE 20 MG PO TABS: 20.0000 mg | ORAL_TABLET | Freq: Two times a day (BID) | ORAL | Status: DC

## 2015-12-20 MED ORDER — ONDANSETRON HCL 4 MG PO TABS: 4.0000 mg | ORAL_TABLET | Freq: Three times a day (TID) | ORAL | 0 refills | Status: DC | PRN

## 2015-12-20 MED ORDER — SUCRALFATE 1 GM/10ML PO SUSP
1.0000 g | Freq: Three times a day (TID) | ORAL | Status: DC
  Administered 2015-12-20: 1 g via ORAL
  Filled 2015-12-20: qty 10

## 2015-12-20 MED ORDER — NITROGLYCERIN 0.4 MG SL SUBL: 0.4000 mg | SUBLINGUAL_TABLET | SUBLINGUAL | 0 refills | Status: DC | PRN

## 2015-12-20 MED ORDER — TECHNETIUM TC 99M TETROFOSMIN IV KIT
30.0000 | PACK | Freq: Once | INTRAVENOUS | Status: AC | PRN
  Administered 2015-12-20: 30 via INTRAVENOUS

## 2015-12-20 NOTE — Discharge Summary (Addendum)
Physician Discharge Summary  Deborah [name] TKZ:[id number] DOB: 29-Oct-1965 DOA: 12/18/2015  PCP: Nolon Bussing, PA  Admit date: 12/18/2015 Discharge date: 12/20/2015  Admitted From:  Home Disposition:  Home   Recommendations for Outpatient Follow-up:  1. Follow up with PCP in 1- week   Home Health: No   Equipment/Devices: No   Discharge Condition: Stable CODE STATUS: Full  Diet recommendation: Heart Healthy / Carb Modified Brief/Interim Summary: This is a 50 year old female who presented to the hospital with a chief complaint of chest pain. She is known to have diabetes mellitus type 2, gastroparesis, fibromyalgia. Her pain was precordial, radiating to her shoulders, no worsening factors. Persistent over last 24 hours. It has been associated with abnormal pain, nausea and vomiting. On initial physical examination she was hemodynamically stable, afebrile, blood pressure 126/72, heart rate 108, respiratory rate 16, oxygen saturation 99%. Heart S1-S2 present rhythmic, lungs were clear auscultation bilaterally, her abdomen had tenderness in the epigastric region. Her cardiac enzymes and EKG were negative for cardiac ischemia. Her sodium was 135, potassium 2.8, creatinine 1.13, glucose 307, magnesium 1.3.   Patient was admitted to hospital with a working diagnosis of atypical chest pain to rule out acute coronary syndrome.  1. Chest pain. Patient was admitted to the telemetry unit, serial cardiac enzymes and EKGs ruled out for cardiac ischemia. Patient received analgesia. Patient underwent pharmacological stress test which resulted in low risk stratification. Patient was seen by cardiology, patient will be discharged on aspirin.  2. Gastritis. Patient was treated with antiacid therapy, antiemetics, IV fluids. Her pain in the epigastric area was suspected to have a competent of gastritis. Patient will be discharged on antiacid therapy.  3. Hypokalemia/hypomagnesemia. Potassium and  magnesium were corrected, it was suspected to have electrolytes disturbances due to GI losses.  4. Dyslipidemia. Patient was continued on pravastatin  5. Type 2 diabetes mellitus. Patient was placed on an insulin scale for glucose coverage and monitoring. Patient's diet was progressively advanced from liquids to regular with good toleration.  Discharge Diagnoses:  Principal Problem:   Chest pain Active Problems:   Essential hypertension, benign   Leukocytosis   Hypokalemia   Hypomagnesemia   Nausea and vomiting   DM (diabetes mellitus), type 2, uncontrolled ([facility])    Discharge Instructions  Discharge Instructions    Diet - low sodium heart healthy    Complete by:  As directed    Discharge instructions    Complete by:  As directed    Please follow with primary care in 7 days.   Increase activity slowly    Complete by:  As directed        Medication List    STOP taking these medications   metoCLOPramide 10 MG tablet Commonly known as:  REGLAN   ranitidine 150 MG tablet Commonly known as:  ZANTAC     TAKE these medications   albuterol 108 (90 Base) MCG/ACT inhaler Commonly known as:  PROVENTIL HFA;VENTOLIN HFA Inhale 2 puffs into the lungs every 6 (six) hours as needed for wheezing or shortness of breath.   allopurinol 100 MG tablet Commonly known as:  ZYLOPRIM Take 1 tablet (100 mg total) by mouth daily.   amLODipine 10 MG tablet Commonly known as:  NORVASC Take 1 tablet (10 mg total) by mouth daily.   DULoxetine 30 MG capsule Commonly known as:  CYMBALTA Take 1 capsule (30 mg total) by mouth 2 (two) times daily.   folic acid 1 MG tablet Commonly known as:  FOLVITE Take 1 tablet (1 mg total) by mouth daily.   metoprolol succinate 25 MG 24 hr tablet Commonly known as:  TOPROL-XL Take 1 tablet (25 mg total) by mouth daily.   nitroGLYCERIN 0.4 MG SL tablet Commonly known as:  NITROSTAT Place 1 tablet (0.4 mg total) under the tongue every 5 (five) minutes  as needed for chest pain.   NOVOLOG MIX 70/30 (70-30) 100 UNIT/ML injection Generic drug:  insulin aspart protamine- aspart Inject 0.5-0.75 mLs (50-75 Units total) into the skin 3 (three) times daily after meals. 75 units every morning, 60 units with lunch, and 50 units with dinner   ondansetron 4 MG tablet Commonly known as:  ZOFRAN Take 1 tablet (4 mg total) by mouth every 8 (eight) hours as needed for nausea or vomiting.   oxyCODONE 5 MG immediate release tablet Commonly known as:  Oxy IR/ROXICODONE Take 1 tablet (5 mg total) by mouth every 8 (eight) hours as needed for moderate pain or severe pain. What changed:  when to take this  reasons to take this   pantoprazole 40 MG tablet Commonly known as:  PROTONIX Take 1 tablet (40 mg total) by mouth 2 (two) times daily.   pravastatin 10 MG tablet Commonly known as:  PRAVACHOL Take 1 tablet (10 mg total) by mouth daily.   senna 8.6 MG tablet Commonly known as:  SENOKOT Take 1 tablet (8.6 mg total) by mouth 2 (two) times daily. What changed:  when to take this  reasons to take this   Vitamin D (Ergocalciferol) 50000 units Caps capsule Commonly known as:  DRISDOL Take 1 capsule (50,000 Units total) by mouth every 7 (seven) days. What changed:  when to take this      Follow-up Information    EVERHART, Mateo Flow, PA Follow up in 1 week(s).   Specialty:  General Practice Contact information: [place] [address] [phone]          Allergies  Allergen Reactions  . Lisinopril Anaphylaxis    Tongue and mouth swelling  . Penicillins Palpitations    Has patient had a PCN reaction causing immediate rash, facial/tongue/throat swelling, SOB or lightheadedness with hypotension: Yes Has patient had a PCN reaction causing severe rash involving mucus membranes or skin necrosis: No Has patient had a PCN reaction that required hospitalization: Yes  Has patient had a PCN reaction occurring within  the last 10 years: No   . Valium [Diazepam] [name]  . Aspirin Other (See Comments)    Irritates stomach ulcer   . Food Hives and Swelling    Carrots, ketchup   . Nsaids Other (See Comments)    ULCER  . Tylenol [Acetaminophen] Other (See Comments)    Irritates stomach ulcer   . Morphine And Related Palpitations    Consultations:  Cardiology    Procedures/Studies: Dg Chest 2 View  Result Date: 12/18/2015 CLINICAL DATA:  Left-sided chest pain with shortness of breath beginning this morning. Hypertension and diabetes. EXAM: CHEST  2 VIEW COMPARISON:  09/26/2015 FINDINGS: The heart size and mediastinal contours are within normal limits. Both lungs are clear. The visualized skeletal structures are unremarkable. IMPRESSION: No active cardiopulmonary disease. Electronically Signed   By: Earle Gell M.D.   On: 12/18/2015 20:17   Dg Chest 2 View  Result Date: 12/05/2015 CLINICAL DATA:  Chest pain with nausea and vomiting today. History of diabetes and hypertension. EXAM: CHEST  2 VIEW COMPARISON:  11/03/2015. FINDINGS: The heart size and  mediastinal contours are normal. The lungs are clear. There is no pleural effusion or pneumothorax. No acute osseous findings are identified. Mild degenerative changes are present within thoracic spine. There are probable asymmetric glenohumeral degenerative changes on the right. IMPRESSION: Stable chest.  No active cardiopulmonary process. Electronically Signed   By: Richardean Sale M.D.   On: 12/05/2015 12:49   Dg Wrist Complete Left  Result Date: 12/09/2015 CLINICAL DATA:  50 year old female with history of trauma from a fall 45 minutes ago. Injury to the left arm complaining of posterior left wrist pain. EXAM: LEFT WRIST - COMPLETE 3+ VIEW COMPARISON:  No priors. FINDINGS: Multiple views of the left wrist demonstrate no acute displaced fracture, subluxation, dislocation, or soft tissue abnormality. IMPRESSION: No acute radiographic abnormality  of the left wrist. Electronically Signed   By: Vinnie Langton M.D.   On: 12/09/2015 11:12   Nm Myocar Multi W/spect W/wall Motion / Ef  Result Date: 12/20/2015 CLINICAL DATA:  50 year old female with a history of chest pain. Cardiovascular risk factors include known cerebral vascular disease, diabetes, hypertension, family history. EXAM: MYOCARDIAL IMAGING WITH SPECT (REST AND PHARMACOLOGIC-STRESS) GATED LEFT VENTRICULAR WALL MOTION STUDY LEFT VENTRICULAR EJECTION FRACTION TECHNIQUE: Standard myocardial SPECT imaging was performed after resting intravenous injection of 10 mCi Tc-95m tetrofosmin. Subsequently, intravenous infusion of Lexiscan was performed under the supervision of the Cardiology staff. At peak effect of the drug, 30 mCi Tc-34m tetrofosmin was injected intravenously and standard myocardial SPECT imaging was performed. Quantitative gated imaging was also performed to evaluate left ventricular wall motion, and estimate left ventricular ejection fraction. COMPARISON:  None. FINDINGS: Perfusion: Small mild perfusion defect on both rest and stress in the mid ventricle anterior lateral wall representing completed infarction versus artifact. No reversible defect identified. Wall Motion: Normal left ventricular wall motion. No left ventricular dilation. Left Ventricular Ejection Fraction: 61 % End diastolic volume 98 ml End systolic volume 39 ml IMPRESSION: 1. Small questionable completed infarct/scar versus artifact in the mid ventricle anterior lateral wall. No reversible defect identified. 2. Normal left ventricular wall motion. 3. Left ventricular ejection fraction 61% 4. Non invasive risk stratification*: Low *2012 Appropriate Use Criteria for Coronary Revascularization Focused Update: J Am Coll Cardiol. 3382;50(5):397-673. http://[email or web address]?articleid=[email or web address] Electronically Signed   By: Corrie Mckusick D.O.   On: 12/20/2015 13:36   Pharmacologic stress test with low risk  stratification.   Subjective: Patient feeling better, no further chest pain or dyspnea. Tolerating well po, no nausea or vomiting.   Discharge Exam: Vitals:   12/20/15 1406 12/20/15 1422  BP: (!) 141/75 (!) 136/100  Pulse: (!) 110   Resp:    Temp: 98.5 F (36.9 C)    Vitals:   12/19/15 2149 12/20/15 0500 12/20/15 1406 12/20/15 1422  BP:  (!) 153/100 (!) 141/75 (!) 136/100  Pulse:  (!) 109 (!) 110   Resp:  19    Temp: 97.6 F (36.4 C) 98.3 F (36.8 C) 98.5 F (36.9 C)   TempSrc: Oral  Oral   SpO2:  99% 98%   Weight:  (!) 137.3 kg (302 lb 9.6 oz)    Height:        General: Pt is alert, awake, not in acute distress Cardiovascular: RRR, S1/S2 +, no rubs, no gallops Respiratory: CTA bilaterally, no wheezing, no rhonchi Abdominal: Soft, NT, ND, bowel sounds + Extremities: no edema, no cyanosis    The results of significant diagnostics from this hospitalization (including imaging, microbiology, ancillary and laboratory) are listed below for reference.  Microbiology: No results found for this or any previous visit (from the past 240 hour(s)).   Labs: BNP (last 3 results) No results for input(s): BNP in the last 8760 hours. Basic Metabolic Panel:  Recent Labs Lab 12/18/15 1949 12/19/15 0232 12/19/15 0811 12/19/15 0818 12/19/15 2002 12/20/15 0532  NA 135  --   --   --  136 135  K 2.8*  --   --  3.1* 3.1* 3.3*  CL 96*  --   --   --  98* 96*  CO2 27  --   --   --  28 28  GLUCOSE 307*  --   --   --  283* 331*  BUN 9  --   --   --  7 5*  CREATININE 1.13*  --  1.01*  --  0.92 0.95  CALCIUM 8.9  --   --   --  8.9 8.8*  MG  --  1.3*  --   --   --  1.4*   Liver Function Tests:  Recent Labs Lab 12/18/15 1949  AST 18  ALT 17  ALKPHOS 97  BILITOT 0.5  PROT 7.6  ALBUMIN 3.7    Recent Labs Lab 12/18/15 1949  LIPASE 23   No results for input(s): AMMONIA in the last 168 hours. CBC:  Recent Labs Lab 12/18/15 1949 12/19/15 0811 12/20/15 0532  WBC  10.7* 9.2 11.1*  NEUTROABS  --  5.5 9.4*  HGB 10.5* 8.8* 10.4*  HCT 32.2* 27.5* 32.3*  MCV 89.2 89.3 88.5  PLT 358 307 350   Cardiac Enzymes:  Recent Labs Lab 12/19/15 0811  TROPONINI <0.03   BNP: Invalid input(s): POCBNP CBG:  Recent Labs Lab 12/19/15 1651 12/19/15 2143 12/20/15 0801 12/20/15 1147 12/20/15 1638  GLUCAP 348* 267* 323* 257* 234*   D-Dimer No results for input(s): DDIMER in the last 72 hours. Hgb A1c No results for input(s): HGBA1C in the last 72 hours. Lipid Profile No results for input(s): CHOL, HDL, LDLCALC, TRIG, CHOLHDL, LDLDIRECT in the last 72 hours. Thyroid function studies No results for input(s): TSH, T4TOTAL, T3FREE, THYROIDAB in the last 72 hours.  Invalid input(s): FREET3 Anemia work up No results for input(s): VITAMINB12, FOLATE, FERRITIN, TIBC, IRON, RETICCTPCT in the last 72 hours. Urinalysis    Component Value Date/Time   COLORURINE YELLOW 12/19/2015 0101   APPEARANCEUR CLEAR 12/19/2015 0101   LABSPEC 1.019 12/19/2015 0101   PHURINE 6.0 12/19/2015 0101   GLUCOSEU >1000 (A) 12/19/2015 0101   HGBUR TRACE (A) 12/19/2015 0101   BILIRUBINUR NEGATIVE 12/19/2015 0101   KETONESUR 15 (A) 12/19/2015 0101   PROTEINUR 100 (A) 12/19/2015 0101   UROBILINOGEN 0.2 10/02/2013 2108   NITRITE NEGATIVE 12/19/2015 0101   LEUKOCYTESUR NEGATIVE 12/19/2015 0101   Sepsis Labs Invalid input(s): PROCALCITONIN,  WBC,  LACTICIDVEN Microbiology No results found for this or any previous visit (from the past 240 hour(s)).   Time coordinating discharge: 45 minutes  SIGNED:   Tawni Millers, MD  [facility] 12/20/2015, 4:59 PM Pager   If 7PM-7AM, please contact night-coverage [email or web address] Password [email or web address]

## 2015-12-20 NOTE — Progress Notes (Signed)
Patient Name: Deborah [name] Date of Encounter: 12/20/2015  Primary Cardiologist: [facility] Problem List     Principal Problem:   Chest pain Active Problems:   Essential hypertension, benign   Leukocytosis   Hypokalemia   Hypomagnesemia   Nausea and vomiting   DM (diabetes mellitus), type 2, uncontrolled (HCC)     Subjective   Complaining of chest pain and SOB after using bathroom. Anxious and tearful.  Inpatient Medications    Scheduled Meds: . allopurinol  100 mg Oral Daily  . amLODipine  10 mg Oral Daily  . DULoxetine  30 mg Oral BID  . enoxaparin (LOVENOX) injection  40 mg Subcutaneous Daily  . famotidine (PEPCID) IV  20 mg Intravenous Q12H  . folic acid  1 mg Oral Daily  . insulin aspart  0-20 Units Subcutaneous TID WC  . magnesium sulfate 1 - 4 g bolus IVPB  2 g Intravenous Once  . metoCLOPramide  10 mg Oral TID AC & HS  . metoprolol succinate  25 mg Oral Daily  . potassium chloride  10 mEq Intravenous Q1 Hr x 4  . pravastatin  10 mg Oral Daily  . sodium chloride flush  3 mL Intravenous Q12H  . sucralfate  1 g Oral TID WC & HS   Continuous Infusions: . sodium chloride 75 mL/hr at 12/20/15 1211   PRN Meds: albuterol, fentaNYL (SUBLIMAZE) injection, nitroGLYCERIN, ondansetron (ZOFRAN) IV, oxyCODONE, promethazine, senna   Vital Signs    Vitals:   12/19/15 2140 12/19/15 2149 12/20/15 0500 12/20/15 1406  BP: 130/88  (!) 153/100 (!) 141/75  Pulse:   (!) 109 (!) 110  Resp: 16  19   Temp:  97.6 F (36.4 C) 98.3 F (36.8 C) 98.5 F (36.9 C)  TempSrc:  Oral  Oral  SpO2:   99% 98%  Weight:   (!) 302 lb 9.6 oz (137.3 kg)   Height:        Intake/Output Summary (Last 24 hours) at 12/20/15 1419 Last data filed at 12/20/15 0609  Gross per 24 hour  Intake          1336.25 ml  Output                0 ml  Net          1336.25 ml   Filed Weights   12/18/15 1938 12/19/15 0845 12/20/15 0500  Weight: (!) 307 lb (139.3 kg) 298 lb 12.8 oz (135.5  kg) (!) 302 lb 9.6 oz (137.3 kg)    Physical Exam    GEN: Well nourished, well developed, in no acute distress.  HEENT: Grossly normal.  Neck: Supple, no JVD, carotid bruits, or masses. Cardiac: Tachycardic, no murmurs, rubs, or gallops. No clubbing, cyanosis, edema.  Respiratory:  Respirations regular and unlabored, clear to auscultation bilaterally. GI: Obese MS: no deformity or atrophy. Skin: warm and dry, no rash. Neuro:  Strength and sensation are intact. Psych: Anxious, tearful.  Labs    CBC  Recent Labs  12/19/15 0811 12/20/15 0532  WBC 9.2 11.1*  NEUTROABS 5.5 9.4*  HGB 8.8* 10.4*  HCT 27.5* 32.3*  MCV 89.3 88.5  PLT 307 825   Basic Metabolic Panel  Recent Labs  12/19/15 0232  12/19/15 2002 12/20/15 0532  NA  --   --  136 135  K  --   < > 3.1* 3.3*  CL  --   --  98* 96*  CO2  --   --  28 28  GLUCOSE  --   --  283* 331*  BUN  --   --  7 5*  CREATININE  --   < > 0.92 0.95  CALCIUM  --   --  8.9 8.8*  MG 1.3*  --   --  1.4*  < > = values in this interval not displayed. Liver Function Tests  Recent Labs  12/18/15 1949  AST 18  ALT 17  ALKPHOS 97  BILITOT 0.5  PROT 7.6  ALBUMIN 3.7    Recent Labs  12/18/15 1949  LIPASE 23   Cardiac Enzymes  Recent Labs  12/19/15 0811  TROPONINI <0.03   BNP Invalid input(s): POCBNP D-Dimer No results for input(s): DDIMER in the last 72 hours. Hemoglobin A1C No results for input(s): HGBA1C in the last 72 hours. Fasting Lipid Panel No results for input(s): CHOL, HDL, LDLCALC, TRIG, CHOLHDL, LDLDIRECT in the last 72 hours. Thyroid Function Tests No results for input(s): TSH, T4TOTAL, T3FREE, THYROIDAB in the last 72 hours.  Invalid input(s): FREET3  Telemetry    Personally Reviewed: sinus tachycardia  ECG    Personally Reviewed: Sinus tachycardia  Radiology    Dg Chest 2 View  Result Date: 12/18/2015 CLINICAL DATA:  Left-sided chest pain with shortness of breath beginning this morning.  Hypertension and diabetes. EXAM: CHEST  2 VIEW COMPARISON:  09/26/2015 FINDINGS: The heart size and mediastinal contours are within normal limits. Both lungs are clear. The visualized skeletal structures are unremarkable. IMPRESSION: No active cardiopulmonary disease. Electronically Signed   By: Earle Gell M.D.   On: 12/18/2015 20:17   Nm Myocar Multi W/spect W/wall Motion / Ef  Result Date: 12/20/2015 CLINICAL DATA:  50 year old female with a history of chest pain. Cardiovascular risk factors include known cerebral vascular disease, diabetes, hypertension, family history. EXAM: MYOCARDIAL IMAGING WITH SPECT (REST AND PHARMACOLOGIC-STRESS) GATED LEFT VENTRICULAR WALL MOTION STUDY LEFT VENTRICULAR EJECTION FRACTION TECHNIQUE: Standard myocardial SPECT imaging was performed after resting intravenous injection of 10 mCi Tc-84m tetrofosmin. Subsequently, intravenous infusion of Lexiscan was performed under the supervision of the Cardiology staff. At peak effect of the drug, 30 mCi Tc-4m tetrofosmin was injected intravenously and standard myocardial SPECT imaging was performed. Quantitative gated imaging was also performed to evaluate left ventricular wall motion, and estimate left ventricular ejection fraction. COMPARISON:  None. FINDINGS: Perfusion: Small mild perfusion defect on both rest and stress in the mid ventricle anterior lateral wall representing completed infarction versus artifact. No reversible defect identified. Wall Motion: Normal left ventricular wall motion. No left ventricular dilation. Left Ventricular Ejection Fraction: 61 % End diastolic volume 98 ml End systolic volume 39 ml IMPRESSION: 1. Small questionable completed infarct/scar versus artifact in the mid ventricle anterior lateral wall. No reversible defect identified. 2. Normal left ventricular wall motion. 3. Left ventricular ejection fraction 61% 4. Non invasive risk stratification*: Low *2012 Appropriate Use Criteria for Coronary  Revascularization Focused Update: J Am Coll Cardiol. 4270;62(3):762-831. [email or web address] Electronically Signed   By: Corrie Mckusick D.O.   On: 12/20/2015 13:36    Cardiac Studies   1. Small questionable completed infarct/scar versus artifact in the mid ventricle anterior lateral wall. No reversible defect identified.  2. Normal left ventricular wall motion.  3. Left ventricular ejection fraction 61%  4. Non invasive risk stratification: Low  Patient Profile     See consult note  Assessment & Plan    1. Chest pain: Stress test results reviewed above. I feel anxiety is playing a  larger role. Given normal wall motion, artifact (more so than scar) is more likely. Continue beta blocker, statin, and nitrates. Consider ASA 81 mg daily upon discharge if Hgb remains stable.  2. HTN: Mildly elevated. Anxiety likely playing a role.  3. Hyperlipidemia: Continue statin.  4. Hypokalemia: Needs repletement.   No further recommendations. Will sign off.  Signed, Kate Sable, MD  12/20/2015, 2:19 PM

## 2016-01-14 ENCOUNTER — Emergency Department (HOSPITAL_COMMUNITY): Payer: Self-pay

## 2016-01-14 ENCOUNTER — Encounter (HOSPITAL_COMMUNITY): Payer: Self-pay | Admitting: Emergency Medicine

## 2016-01-14 ENCOUNTER — Emergency Department (HOSPITAL_COMMUNITY)
Admission: EM | Admit: 2016-01-14 | Discharge: 2016-01-14 | Disposition: A | Discharge status: Home / Self Care | Payer: Self-pay | Attending: Emergency Medicine | Admitting: Emergency Medicine

## 2016-01-14 DIAGNOSIS — I129 Hypertensive chronic kidney disease with stage 1 through stage 4 chronic kidney disease, or unspecified chronic kidney disease: Secondary | ICD-10-CM | POA: Insufficient documentation

## 2016-01-14 DIAGNOSIS — R079 Chest pain, unspecified: Secondary | ICD-10-CM | POA: Insufficient documentation

## 2016-01-14 DIAGNOSIS — R112 Nausea with vomiting, unspecified: Secondary | ICD-10-CM

## 2016-01-14 DIAGNOSIS — K3184 Gastroparesis: Secondary | ICD-10-CM

## 2016-01-14 DIAGNOSIS — Z79899 Other long term (current) drug therapy: Secondary | ICD-10-CM | POA: Insufficient documentation

## 2016-01-14 DIAGNOSIS — E1140 Type 2 diabetes mellitus with diabetic neuropathy, unspecified: Secondary | ICD-10-CM | POA: Insufficient documentation

## 2016-01-14 DIAGNOSIS — Z794 Long term (current) use of insulin: Secondary | ICD-10-CM | POA: Insufficient documentation

## 2016-01-14 DIAGNOSIS — N183 Chronic kidney disease, stage 3 (moderate): Secondary | ICD-10-CM | POA: Insufficient documentation

## 2016-01-14 DIAGNOSIS — E1143 Type 2 diabetes mellitus with diabetic autonomic (poly)neuropathy: Secondary | ICD-10-CM | POA: Insufficient documentation

## 2016-01-14 DIAGNOSIS — E1122 Type 2 diabetes mellitus with diabetic chronic kidney disease: Secondary | ICD-10-CM | POA: Insufficient documentation

## 2016-01-14 LAB — URINE MICROSCOPIC-ADD ON

## 2016-01-14 LAB — HEPATIC FUNCTION PANEL
ALK PHOS: 92 U/L (ref 38–126)
ALT: 20 U/L (ref 14–54)
AST: 27 U/L (ref 15–41)
Albumin: 4 g/dL (ref 3.5–5.0)
BILIRUBIN DIRECT: 0.2 mg/dL (ref 0.1–0.5)
BILIRUBIN INDIRECT: 1 mg/dL — AB (ref 0.3–0.9)
BILIRUBIN TOTAL: 1.2 mg/dL (ref 0.3–1.2)
Total Protein: 8.5 g/dL — ABNORMAL HIGH (ref 6.5–8.1)

## 2016-01-14 LAB — URINALYSIS, ROUTINE W REFLEX MICROSCOPIC
BILIRUBIN URINE: NEGATIVE
Glucose, UA: >1000 mg/dL — AB
Ketones, ur: 40 mg/dL — AB
LEUKOCYTES UA: NEGATIVE
NITRITE: NEGATIVE
Protein, ur: 30 mg/dL — AB
pH: 5.5 (ref 5.0–8.0)

## 2016-01-14 LAB — CBC
HEMATOCRIT: 35 % — AB (ref 36.0–46.0)
Hemoglobin: 11.4 g/dL — ABNORMAL LOW (ref 12.0–15.0)
MCH: 28.8 pg (ref 26.0–34.0)
MCHC: 32.6 g/dL (ref 30.0–36.0)
MCV: 88.4 fL (ref 78.0–100.0)
Platelets: 347 10*3/uL (ref 150–400)
RBC: 3.96 MIL/uL (ref 3.87–5.11)
RDW: 13.3 % (ref 11.5–15.5)
WBC: 10 10*3/uL (ref 4.0–10.5)

## 2016-01-14 LAB — BASIC METABOLIC PANEL
ANION GAP: 16 — AB (ref 5–15)
BUN: 12 mg/dL (ref 6–20)
CALCIUM: 9.2 mg/dL (ref 8.9–10.3)
CO2: 24 mmol/L (ref 22–32)
Chloride: 92 mmol/L — ABNORMAL LOW (ref 101–111)
Creatinine, Ser: 1.07 mg/dL — ABNORMAL HIGH (ref 0.44–1.00)
GFR calc Af Amer: >60 mL/min (ref >60)
GFR, EST NON AFRICAN AMERICAN: 59 mL/min — AB (ref >60)
Glucose, Bld: 456 mg/dL — ABNORMAL HIGH (ref 65–99)
POTASSIUM: 3.9 mmol/L (ref 3.5–5.1)
SODIUM: 132 mmol/L — AB (ref 135–145)

## 2016-01-14 LAB — I-STAT TROPONIN, ED: TROPONIN I, POC: 0 ng/mL (ref 0.00–0.08)

## 2016-01-14 LAB — RAPID URINE DRUG SCREEN, HOSP PERFORMED
AMPHETAMINES: NOT DETECTED
Barbiturates: NOT DETECTED
Benzodiazepines: NOT DETECTED
Cocaine: NOT DETECTED
OPIATES: NOT DETECTED
Tetrahydrocannabinol: NOT DETECTED

## 2016-01-14 LAB — LIPASE, BLOOD: Lipase: 25 U/L (ref 11–51)

## 2016-01-14 LAB — TROPONIN I: Troponin I: <0.03 ng/mL (ref <0.03)

## 2016-01-14 MED ORDER — SODIUM CHLORIDE 0.9 % IV BOLUS (SEPSIS)
1000.0000 mL | Freq: Once | INTRAVENOUS | Status: AC
  Administered 2016-01-14: 1000 mL via INTRAVENOUS

## 2016-01-14 MED ORDER — ONDANSETRON HCL 4 MG/2ML IJ SOLN
4.0000 mg | Freq: Once | INTRAMUSCULAR | Status: AC
  Administered 2016-01-14: 4 mg via INTRAVENOUS
  Filled 2016-01-14: qty 2

## 2016-01-14 MED ORDER — GI COCKTAIL ~~LOC~~
30.0000 mL | Freq: Once | ORAL | Status: AC
  Administered 2016-01-14: 30 mL via ORAL
  Filled 2016-01-14: qty 30

## 2016-01-14 MED ORDER — METOCLOPRAMIDE HCL 5 MG/ML IJ SOLN
10.0000 mg | Freq: Once | INTRAMUSCULAR | Status: AC
Start: 2016-01-14 — End: 2016-01-14
  Administered 2016-01-14: 10 mg via INTRAVENOUS
  Filled 2016-01-14: qty 2

## 2016-01-14 MED ORDER — IOPAMIDOL (ISOVUE-370) INJECTION 76%
100.0000 mL | Freq: Once | INTRAVENOUS | Status: AC | PRN
  Administered 2016-01-14: 100 mL via INTRAVENOUS

## 2016-01-14 MED ORDER — DICYCLOMINE HCL 20 MG PO TABS: 20.0000 mg | ORAL_TABLET | Freq: Two times a day (BID) | ORAL | 0 refills | Status: DC

## 2016-01-14 MED ORDER — DICYCLOMINE HCL 10 MG PO CAPS
20.0000 mg | ORAL_CAPSULE | Freq: Once | ORAL | Status: AC
  Administered 2016-01-14: 20 mg via ORAL
  Filled 2016-01-14: qty 2

## 2016-01-14 MED ORDER — OXYCODONE HCL 5 MG PO TABS
10.0000 mg | ORAL_TABLET | Freq: Once | ORAL | Status: AC
  Administered 2016-01-14: 10 mg via ORAL
  Filled 2016-01-14: qty 2

## 2016-01-14 MED ORDER — METOCLOPRAMIDE HCL 10 MG PO TABS: 10.0000 mg | ORAL_TABLET | Freq: Four times a day (QID) | ORAL | 0 refills | Status: DC

## 2016-01-14 MED ORDER — PANTOPRAZOLE SODIUM 40 MG PO TBEC
40.0000 mg | DELAYED_RELEASE_TABLET | Freq: Once | ORAL | Status: AC
  Administered 2016-01-14: 40 mg via ORAL
  Filled 2016-01-14: qty 1

## 2016-01-14 MED ORDER — HYDROMORPHONE HCL 1 MG/ML IJ SOLN
0.5000 mg | Freq: Once | INTRAMUSCULAR | Status: AC
  Administered 2016-01-14: 0.5 mg via INTRAVENOUS
  Filled 2016-01-14: qty 1

## 2016-01-14 MED ORDER — ONDANSETRON HCL 4 MG/2ML IJ SOLN: 4.0000 mg | Freq: Once | INTRAMUSCULAR | Status: DC

## 2016-01-14 MED ORDER — NITROGLYCERIN 0.4 MG SL SUBL
0.4000 mg | SUBLINGUAL_TABLET | SUBLINGUAL | Status: AC | PRN
  Administered 2016-01-14 (×3): 0.4 mg via SUBLINGUAL
  Filled 2016-01-14: qty 1

## 2016-01-14 MED ORDER — PANTOPRAZOLE SODIUM 40 MG PO TBEC: 40.0000 mg | DELAYED_RELEASE_TABLET | Freq: Two times a day (BID) | ORAL | 0 refills | Status: DC

## 2016-01-14 MED ORDER — HALOPERIDOL LACTATE 5 MG/ML IJ SOLN
5.0000 mg | Freq: Once | INTRAMUSCULAR | Status: AC
  Administered 2016-01-14: 5 mg via INTRAVENOUS
  Filled 2016-01-14: qty 1

## 2016-01-14 NOTE — ED Triage Notes (Addendum)
Pt c/o centralized chest pain that began last night; pt is vomiting in triage and is taking very shallow and quick respirations; pt states she had pain like this 1 month ago but does not know why; A&Ox4

## 2016-01-14 NOTE — ED Provider Notes (Signed)
[place] DEPT Provider Note   [name]: [id number] Arrival date & time: 01/14/16  0708     History   Chief Complaint Chief Complaint  Patient presents with  . Chest Pain    HPI Deborah [name] is a 50 y.o. female.  HPI  Stabbing pain in chest, began last night, associated shortness of breath, nausea, vomiting, now pain radiating to abdomen  Past Medical History:  Diagnosis Date  . Chest pain 12/2015  . Diabetes mellitus   . Fibromyalgia   . Gastric ulcer   . Gastroparesis   . Hypertension   . Obesity   . Pneumonia   . Stroke [facility]) 02/2011    Patient Active Problem List   Diagnosis Date Noted  . AKI (acute kidney injury) ([place]) 12/06/2015  . Chest pain 09/26/2015  . Hypokalemia 09/26/2015  . DM (diabetes mellitus), type 2, uncontrolled ([place]) 09/26/2015  . Hypomagnesemia 09/26/2015  . Gastroparesis 09/26/2015  . Gastroparesis 09/21/2015  . Hypokalemia 08/20/2015  . Hypomagnesemia 08/20/2015  . Nausea and vomiting 08/20/2015  . UTI (urinary tract infection) 05/27/2015  . Gout flare 05/27/2015  . Leukocytosis 05/26/2015  . Nausea with vomiting 05/26/2015  . Abdominal pain 05/26/2015  . [name] 05/26/2015  . DKA (diabetic ketoacidoses) ([place]) 05/25/2015  . Uncontrolled type 2 diabetes mellitus with diabetic neuropathy, with long-term current use of insulin ([place]) 05/25/2015  . Dyslipidemia associated with type 2 diabetes mellitus ([place]) 05/25/2015  . CKD (chronic kidney disease) stage 3, GFR 30-59 ml/min 05/25/2015  . Essential hypertension, benign 09/28/2013    Past Surgical History:  Procedure Laterality Date  . CATARACT EXTRACTION  01/2014  . CHOLECYSTECTOMY      OB History    No data available       Home Medications    Prior to Admission medications   Medication Sig Start Date End Date Taking? Authorizing Provider  albuterol (PROVENTIL HFA;VENTOLIN HFA) 108 (90 Base) MCG/ACT inhaler Inhale 2 puffs into the lungs every 6 (six)  hours as needed for wheezing or shortness of breath. 09/23/15  Yes Debbe Odea, MD  allopurinol (ZYLOPRIM) 100 MG tablet Take 1 tablet (100 mg total) by mouth daily. 09/23/15  Yes Debbe Odea, MD  amLODipine (NORVASC) 10 MG tablet Take 1 tablet (10 mg total) by mouth daily. 09/23/15  Yes Debbe Odea, MD  DULoxetine (CYMBALTA) 30 MG capsule Take 1 capsule (30 mg total) by mouth 2 (two) times daily. 09/23/15  Yes Debbe Odea, MD  metoprolol succinate (TOPROL-XL) 25 MG 24 hr tablet Take 1 tablet (25 mg total) by mouth daily. 09/23/15  Yes Debbe Odea, MD  nitroGLYCERIN (NITROSTAT) 0.4 MG SL tablet Place 1 tablet (0.4 mg total) under the tongue every 5 (five) minutes as needed for chest pain. 12/20/15  Yes Mauricio Gerome Apley, MD  NOVOLOG MIX 70/30 (70-30) 100 UNIT/ML injection Inject 0.5-0.75 mLs (50-75 Units total) into the skin 3 (three) times daily after meals. 75 units every morning, 60 units with lunch, and 50 units with dinner 09/23/15  Yes Debbe Odea, MD  ondansetron (ZOFRAN) 4 MG tablet Take 1 tablet (4 mg total) by mouth every 8 (eight) hours as needed for nausea or vomiting. 12/20/15  Yes Mauricio Gerome Apley, MD  oxyCODONE (OXY IR/ROXICODONE) 5 MG immediate release tablet Take 1 tablet (5 mg total) by mouth every 8 (eight) hours as needed for moderate pain or severe pain. 12/20/15  Yes Mauricio Gerome Apley, MD  pantoprazole (PROTONIX) 40 MG tablet Take 1 tablet (40  mg total) by mouth 2 (two) times daily. 09/23/15  Yes Debbe Odea, MD  pravastatin (PRAVACHOL) 10 MG tablet Take 1 tablet (10 mg total) by mouth daily. 09/23/15  Yes Debbe Odea, MD  senna (SENOKOT) 8.6 MG tablet Take 1 tablet (8.6 mg total) by mouth 2 (two) times daily. Patient taking differently: Take 1 tablet by mouth daily as needed for constipation.  09/23/15 09/22/16 Yes Debbe Odea, MD  dicyclomine (BENTYL) 20 MG tablet Take 1 tablet (20 mg total) by mouth 2 (two) times daily. 01/14/16   Gareth Morgan, MD  folic acid  (FOLVITE) 1 MG tablet Take 1 tablet (1 mg total) by mouth daily. Patient not taking: Reported on 12/19/2015 12/10/15   Mir Marry Guan, MD  metoCLOPramide (REGLAN) 10 MG tablet Take 1 tablet (10 mg total) by mouth every 6 (six) hours. 01/14/16   Gareth Morgan, MD  pantoprazole (PROTONIX) 40 MG tablet Take 1 tablet (40 mg total) by mouth 2 (two) times daily. 01/14/16   Gareth Morgan, MD  Vitamin D, Ergocalciferol, (DRISDOL) 50000 units CAPS capsule Take 1 capsule (50,000 Units total) by mouth every 7 (seven) days. Patient taking differently: Take 50,000 Units by mouth every 30 (thirty) days.  09/23/15   Debbe Odea, MD    Family History Family History  Problem Relation Age of Onset  . Diabetes Mother   . Diabetes Father   . Heart disease Father   . Diabetes Sister   . Congestive Heart Failure Sister 38  . Diabetes Brother     Social History Social History  Substance Use Topics  . Smoking status: Never Smoker  . Smokeless tobacco: Never Used  . Alcohol use No     Allergies   Lisinopril; Penicillins; Valium [diazepam]; Aspirin; Food; Nsaids; Tylenol [acetaminophen]; and Morphine and related   Review of Systems Review of Systems  Constitutional: Negative for fever.  HENT: Negative for sore throat.   Eyes: Negative for visual disturbance.  Respiratory: Positive for shortness of breath. Negative for cough.   Cardiovascular: Positive for chest pain. Negative for leg swelling.  Gastrointestinal: Positive for nausea and vomiting. Negative for abdominal pain, constipation and diarrhea.  Genitourinary: Negative for difficulty urinating.  Musculoskeletal: Negative for back pain and neck pain.  Skin: Negative for rash.  Neurological: Negative for syncope and headaches.     Physical Exam Updated Vital Signs BP 148/93 (BP Location: Right Arm)   Pulse 115   Resp 16   LMP 10/10/2012   SpO2 95%   Physical Exam  Constitutional: She is oriented to person, place, and time.  She appears well-developed and well-nourished. No distress.  HENT:  Head: Normocephalic and atraumatic.  Eyes: Conjunctivae and EOM are normal.  Neck: Normal range of motion.  Cardiovascular: Normal rate, regular rhythm, normal heart sounds and intact distal pulses.  Exam reveals no gallop and no friction rub.   No murmur heard. Pulmonary/Chest: Effort normal and breath sounds normal. No respiratory distress. She has no wheezes. She has no rales.  Abdominal: Soft. She exhibits no distension. There is tenderness (epigastric, LUQ). There is no guarding.  Musculoskeletal: She exhibits no edema or tenderness.  Neurological: She is alert and oriented to person, place, and time.  Skin: Skin is warm and dry. No rash noted. She is not diaphoretic. No erythema.  Nursing note and vitals reviewed.    ED Treatments / Results  Labs (all labs ordered are listed, but only abnormal results are displayed) Labs Reviewed  BASIC METABOLIC PANEL -  Abnormal; Notable for the following:       Result Value   Sodium 132 (*)    Chloride 92 (*)    Glucose, Bld 456 (*)    Creatinine, Ser 1.07 (*)    GFR calc non Af Amer 59 (*)    Anion gap 16 (*)    All other components within normal limits  CBC - Abnormal; Notable for the following:    Hemoglobin 11.4 (*)    HCT 35.0 (*)    All other components within normal limits  HEPATIC FUNCTION PANEL - Abnormal; Notable for the following:    Total Protein 8.5 (*)    Indirect Bilirubin 1.0 (*)    All other components within normal limits  URINALYSIS, ROUTINE W REFLEX MICROSCOPIC (NOT AT [facility]) - Abnormal; Notable for the following:    Specific Gravity, Urine >1.046 (*)    Glucose, UA >1000 (*)    Hgb urine dipstick TRACE (*)    Ketones, ur 40 (*)    Protein, ur 30 (*)    All other components within normal limits  URINE MICROSCOPIC-ADD ON - Abnormal; Notable for the following:    Squamous Epithelial / LPF 6-30 (*)    Bacteria, UA FEW (*)    All other components  within normal limits  TROPONIN I  LIPASE, BLOOD  RAPID URINE DRUG SCREEN, HOSP PERFORMED  I-STAT TROPOININ, ED  I-STAT TROPOININ, ED    EKG  EKG Interpretation  Date/Time:  Wednesday January 14 2016 07:17:34 EST Ventricular Rate:  127 PR Interval:    QRS Duration: 70 QT Interval:  310 QTC Calculation: 451 R Axis:   -57 Text Interpretation:  Sinus tachycardia Probable left atrial enlargement RSR' in V1 or V2, right VCD or RVH Inferior infarct, old No significant change was found Confirmed by [facility] MD, Aneyah Lortz ([id number]) on 01/14/2016 7:29:56 AM Also confirmed by [facility] MD, Sahej Hauswirth ([id number]), editor [place], Leda Gauze [phone])  on 01/14/2016 7:55:45 AM       Radiology Dg Chest 2 View  Result Date: 01/14/2016 CLINICAL DATA:  Chest pain, shortness of breath EXAM: CHEST  2 VIEW COMPARISON:  12/05/2015 FINDINGS: The heart size and mediastinal contours are within normal limits. Both lungs are clear. The visualized skeletal structures are unremarkable. IMPRESSION: No active cardiopulmonary disease. Electronically Signed   By: Kathreen Devoid   On: 01/14/2016 07:58   Ct Angio Chest Pe W And/or Wo Contrast  Result Date: 01/14/2016 CLINICAL DATA:  Chest pain EXAM: CT ANGIOGRAPHY CHEST WITH CONTRAST TECHNIQUE: Multidetector CT imaging of the chest was performed using the standard protocol during bolus administration of intravenous contrast. Multiplanar CT image reconstructions and MIPs were obtained to evaluate the vascular anatomy. CONTRAST:  100 cc of Isovue 370 COMPARISON:  None. FINDINGS: Cardiovascular: Satisfactory opacification of the pulmonary arteries to the segmental level. No evidence of pulmonary embolism. Normal heart size. No pericardial effusion. Mediastinum/Nodes: No enlarged mediastinal, hilar, or axillary lymph nodes. Thyroid gland, trachea, and esophagus demonstrate no significant findings. Lungs/Pleura: No airspace opacities or atelectasis. Upper Abdomen: Hepatic steatosis noted.  No  acute abnormality noted. Musculoskeletal: Spondylosis noted. No aggressive lytic or sclerotic bone lesions. Review of the MIP images confirms the above findings. IMPRESSION: 1. No evidence for acute pulmonary embolus. Electronically Signed   By: Kerby Moors M.D.   On: 01/14/2016 09:51    Procedures Procedures (including critical care time)  Medications Ordered in ED Medications  ondansetron (ZOFRAN) injection 4 mg (4 mg Intravenous Given 01/14/16 0747)  nitroGLYCERIN (NITROSTAT) SL tablet 0.4 mg (0.4 mg Sublingual Given 01/14/16 0818)  HYDROmorphone (DILAUDID) injection 0.5 mg (0.5 mg Intravenous Given 01/14/16 0805)  iopamidol (ISOVUE-370) 76 % injection 100 mL (100 mLs Intravenous Contrast Given 01/14/16 0910)  sodium chloride 0.9 % bolus 1,000 mL (0 mLs Intravenous Stopped 01/14/16 1550)  haloperidol lactate (HALDOL) injection 5 mg (5 mg Intravenous Given 01/14/16 1210)  metoCLOPramide (REGLAN) injection 10 mg (10 mg Intravenous Given 01/14/16 1433)  oxyCODONE (Oxy IR/ROXICODONE) immediate release tablet 10 mg (10 mg Oral Given 01/14/16 1433)  dicyclomine (BENTYL) capsule 20 mg (20 mg Oral Given 01/14/16 1433)  pantoprazole (PROTONIX) EC tablet 40 mg (40 mg Oral Given 01/14/16 1433)  gi cocktail (Maalox,Lidocaine,Donnatal) (30 mLs Oral Given 01/14/16 1433)     Initial Impression / Assessment and Plan / ED Course  I have reviewed the triage vital signs and the nursing notes.  Pertinent labs & imaging results that were available during my care of the patient were reviewed by me and considered in my medical decision making (see chart for details).  Clinical Course    50 year old female with history of chest pain, diabetes, fibromyalgia, gastroparesis, gastric ulcer, CVA, hypertension,  presents with concern for chest pain, nausea and vomiting. EKG shows sinus tachycardia. Initial troponin is negative. CT PE study was done which showed no sign of pulmonary embolus, no evidence of aortic  dissection. Lipase and hepatic panel within normal limits. Her abdominal exam is benign, doubt appendicitis, cholecystitis, diverticulitis. Urinalysis shows no sign of UTI.  Small ketones in urine, however bicarb WNL and doubt DKA.  Delta troponins done and negative. Patient recently had an admission and workup for chest pain with low risk stress test. Pain is atypical, and at this time, patient describes it as pain consistent with prior flares of gastroparesis with history of multiple similar episodes.  Reports continuing nausea and vomiting.  Patient requesting Dilaudid for gastropresis.   Patient was given IV saline, Haldol for her symptoms with improvement. Able to tolerate po, given oxycodone, bentyl, PPI, reglan.  Given patient tolerating po, abd exam benign, feel she is appropriate for attempt of outpatient management. Tachycardia present on several past similar episodes with outpt management. Given rx for reglan, bentyl, PPI. Patient discharged in stable condition with understanding of reasons to return.    Final Clinical Impressions(s) / ED Diagnoses   Final diagnoses:  Chest pain, unspecified type  Gastroparesis  Nausea and vomiting, intractability of vomiting not specified, unspecified vomiting type    New Prescriptions Discharge Medication List as of 01/14/2016  3:42 PM    START taking these medications   Details  dicyclomine (BENTYL) 20 MG tablet Take 1 tablet (20 mg total) by mouth 2 (two) times daily., Starting Wed 01/14/2016, Print    metoCLOPramide (REGLAN) 10 MG tablet Take 1 tablet (10 mg total) by mouth every 6 (six) hours., Starting Wed 01/14/2016, Print    !! pantoprazole (PROTONIX) 40 MG tablet Take 1 tablet (40 mg total) by mouth 2 (two) times daily., Starting Wed 01/14/2016, Print     !! - Potential duplicate medications found. Please discuss with provider.       Gareth Morgan, MD 01/14/16 2044

## 2016-01-14 NOTE — ED Notes (Signed)
Pt states chest pain is gone and is requesting pain meds for abd.

## 2016-01-14 NOTE — ED Notes (Signed)
Patient transported to CT 

## 2016-01-14 NOTE — ED Notes (Signed)
ED Provider at bedside. 

## 2016-01-14 NOTE — ED Notes (Signed)
Pt wants I stat pulled from IV - RN aware.

## 2016-01-21 IMAGING — CR DG CHEST 2V
2 series · 2 of 2 positions shown · non-contrast
Comparison: One-view chest 10/02/2013.

CLINICAL DATA: Initial encounter for is centralized chest pain and
cough. [name].

EXAM:
CHEST  2 VIEW

[w chest pa]
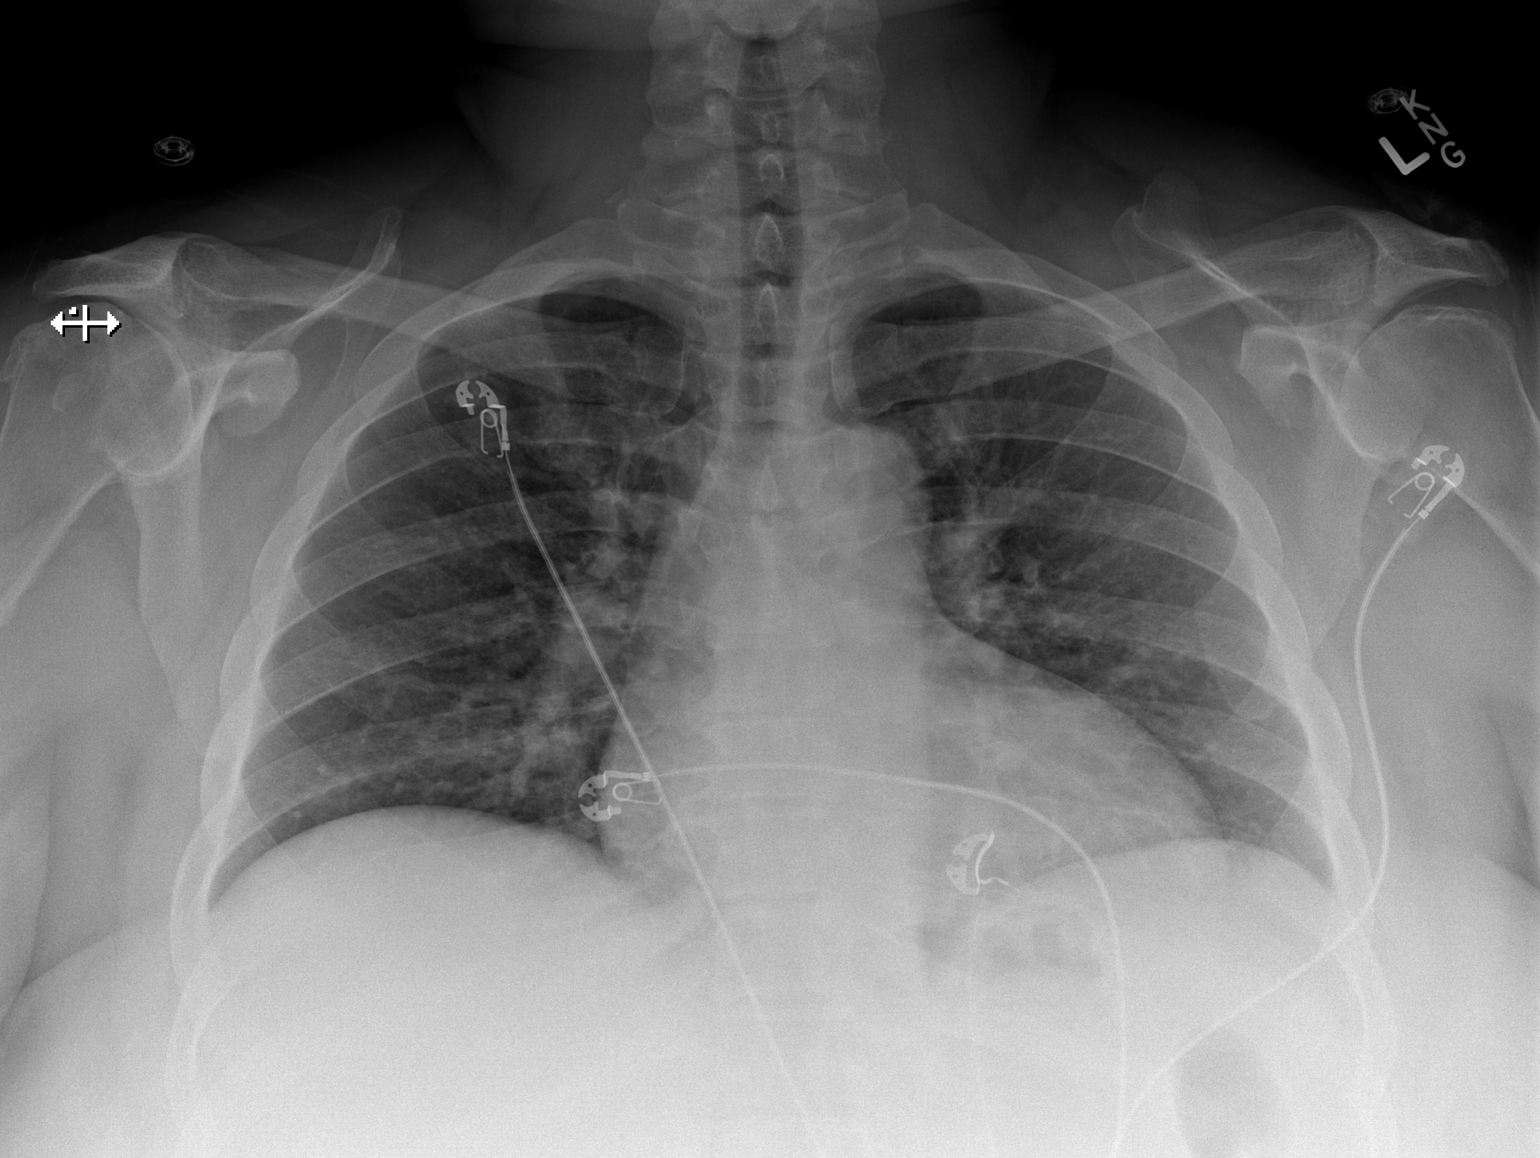

[w chest lat]
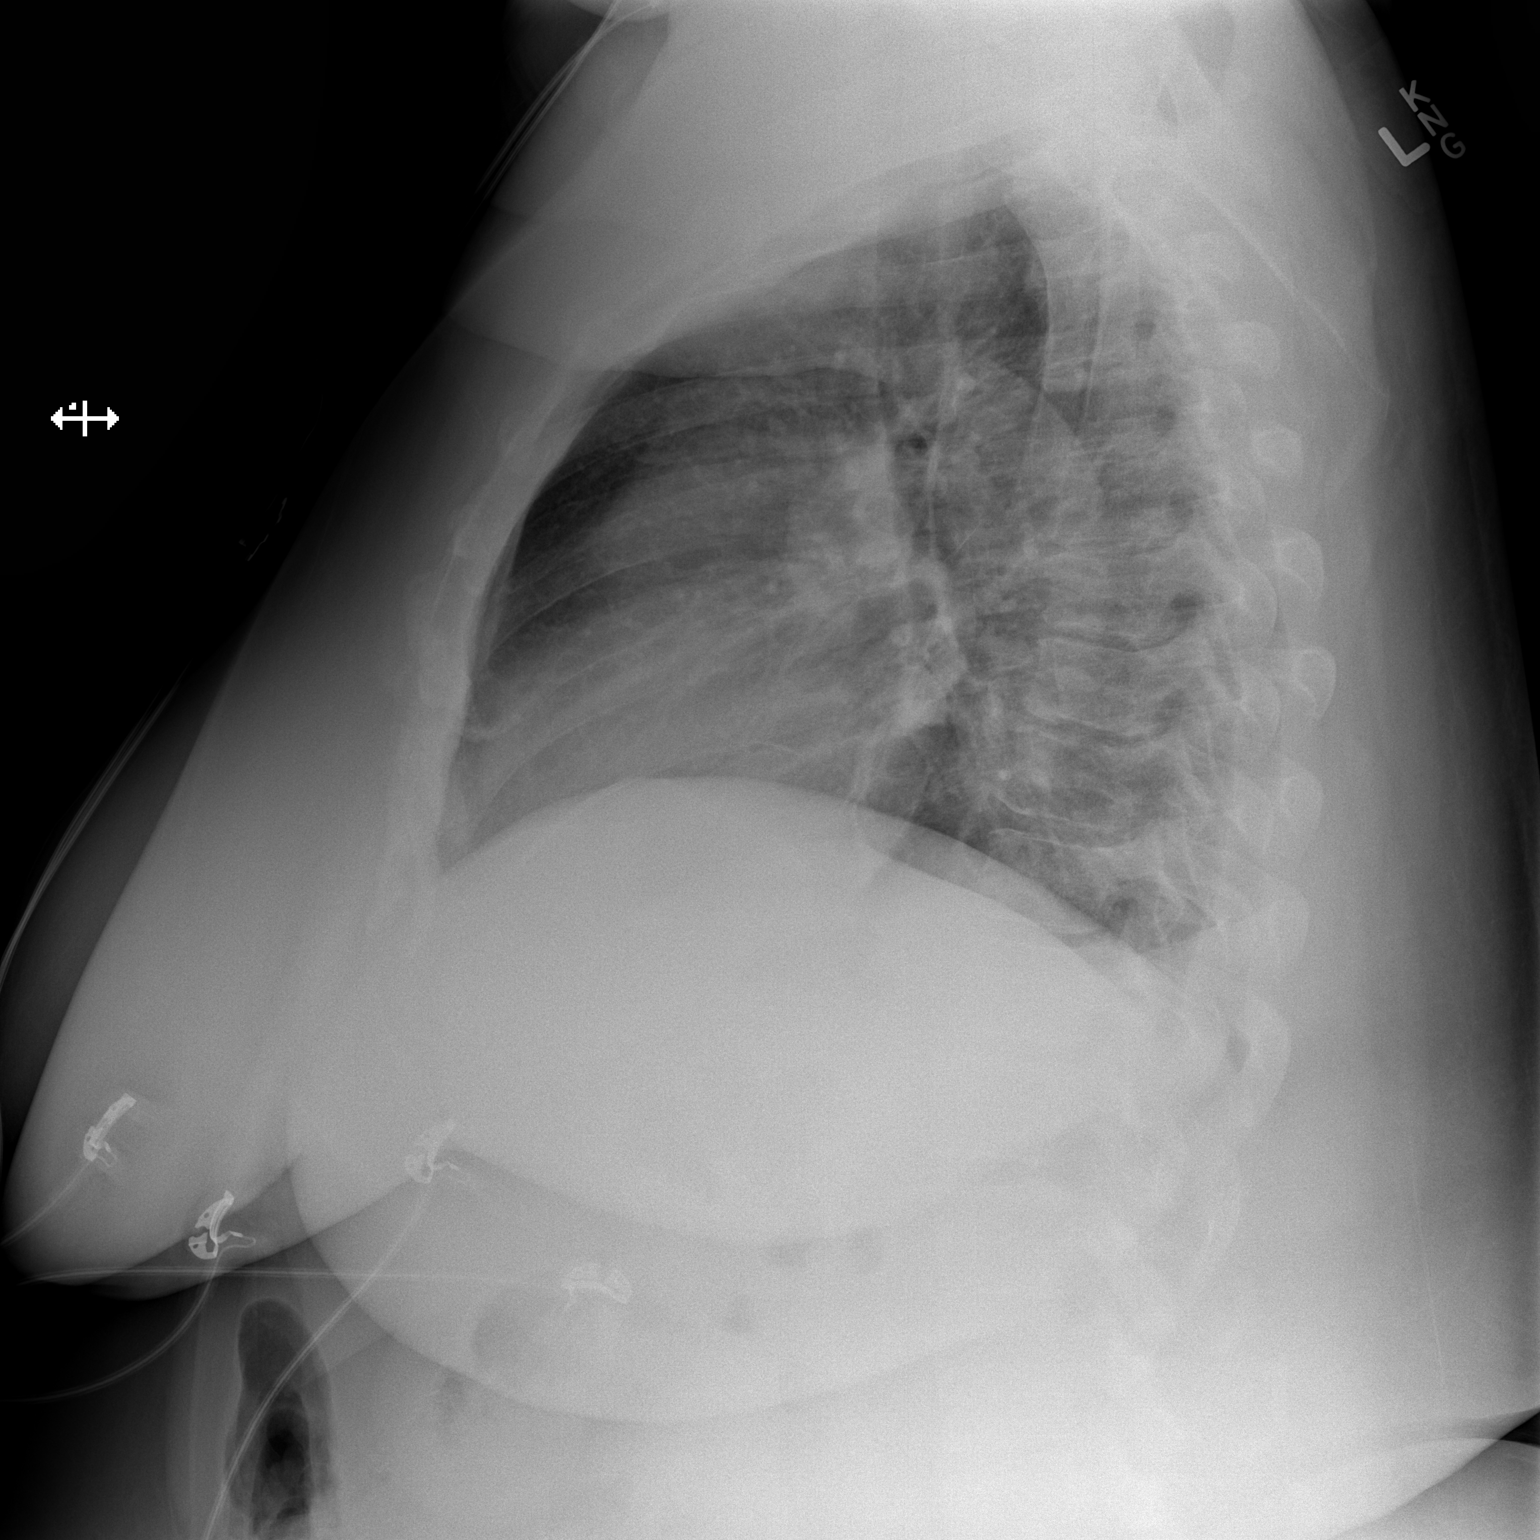

[2 of 2 positions shown; findings below may reference images not displayed]

FINDINGS: Heart size exaggerated by low lung volumes. Mild central airway
thickening scratch the mild pulmonary vascular congestion is
present. There is no focal airspace disease. The visualized soft
tissues and bony thorax are unremarkable.
IMPRESSION: 1. Low lung volumes with mild pulmonary vascular congestion.

## 2016-02-28 ENCOUNTER — Inpatient Hospital Stay (HOSPITAL_COMMUNITY)
Admission: EM | Admit: 2016-02-28 | Discharge: 2016-03-02 | DRG: 074 | Disposition: A | Discharge status: Home / Self Care | Payer: Medicaid Other | Attending: Internal Medicine | Admitting: Internal Medicine

## 2016-02-28 ENCOUNTER — Encounter (HOSPITAL_COMMUNITY): Payer: Self-pay

## 2016-02-28 ENCOUNTER — Emergency Department (HOSPITAL_COMMUNITY): Payer: Medicaid Other

## 2016-02-28 DIAGNOSIS — Z885 Allergy status to narcotic agent status: Secondary | ICD-10-CM

## 2016-02-28 DIAGNOSIS — E1143 Type 2 diabetes mellitus with diabetic autonomic (poly)neuropathy: Principal | ICD-10-CM | POA: Diagnosis present

## 2016-02-28 DIAGNOSIS — E876 Hypokalemia: Secondary | ICD-10-CM | POA: Diagnosis present

## 2016-02-28 DIAGNOSIS — R112 Nausea with vomiting, unspecified: Secondary | ICD-10-CM | POA: Diagnosis present

## 2016-02-28 DIAGNOSIS — M109 Gout, unspecified: Secondary | ICD-10-CM | POA: Diagnosis present

## 2016-02-28 DIAGNOSIS — M797 Fibromyalgia: Secondary | ICD-10-CM | POA: Diagnosis present

## 2016-02-28 DIAGNOSIS — Z833 Family history of diabetes mellitus: Secondary | ICD-10-CM

## 2016-02-28 DIAGNOSIS — Z8711 Personal history of peptic ulcer disease: Secondary | ICD-10-CM

## 2016-02-28 DIAGNOSIS — N183 Chronic kidney disease, stage 3 (moderate): Secondary | ICD-10-CM | POA: Diagnosis present

## 2016-02-28 DIAGNOSIS — R079 Chest pain, unspecified: Secondary | ICD-10-CM | POA: Diagnosis present

## 2016-02-28 DIAGNOSIS — E860 Dehydration: Secondary | ICD-10-CM | POA: Diagnosis present

## 2016-02-28 DIAGNOSIS — Z880 Allergy status to penicillin: Secondary | ICD-10-CM

## 2016-02-28 DIAGNOSIS — Z8673 Personal history of transient ischemic attack (TIA), and cerebral infarction without residual deficits: Secondary | ICD-10-CM

## 2016-02-28 DIAGNOSIS — I10 Essential (primary) hypertension: Secondary | ICD-10-CM | POA: Diagnosis present

## 2016-02-28 DIAGNOSIS — I129 Hypertensive chronic kidney disease with stage 1 through stage 4 chronic kidney disease, or unspecified chronic kidney disease: Secondary | ICD-10-CM | POA: Diagnosis present

## 2016-02-28 DIAGNOSIS — N184 Chronic kidney disease, stage 4 (severe): Secondary | ICD-10-CM | POA: Diagnosis present

## 2016-02-28 DIAGNOSIS — Z888 Allergy status to other drugs, medicaments and biological substances status: Secondary | ICD-10-CM

## 2016-02-28 DIAGNOSIS — Z794 Long term (current) use of insulin: Secondary | ICD-10-CM | POA: Diagnosis present

## 2016-02-28 DIAGNOSIS — N182 Chronic kidney disease, stage 2 (mild): Secondary | ICD-10-CM | POA: Diagnosis present

## 2016-02-28 DIAGNOSIS — N1832 Chronic kidney disease, stage 3b: Secondary | ICD-10-CM | POA: Diagnosis present

## 2016-02-28 DIAGNOSIS — E1165 Type 2 diabetes mellitus with hyperglycemia: Secondary | ICD-10-CM | POA: Diagnosis present

## 2016-02-28 DIAGNOSIS — E785 Hyperlipidemia, unspecified: Secondary | ICD-10-CM | POA: Diagnosis present

## 2016-02-28 DIAGNOSIS — E119 Type 2 diabetes mellitus without complications: Secondary | ICD-10-CM | POA: Diagnosis present

## 2016-02-28 DIAGNOSIS — Z886 Allergy status to analgesic agent status: Secondary | ICD-10-CM

## 2016-02-28 DIAGNOSIS — Z6841 Body Mass Index (BMI) 40.0 and over, adult: Secondary | ICD-10-CM

## 2016-02-28 DIAGNOSIS — K3184 Gastroparesis: Secondary | ICD-10-CM

## 2016-02-28 DIAGNOSIS — N179 Acute kidney failure, unspecified: Secondary | ICD-10-CM | POA: Diagnosis present

## 2016-02-28 DIAGNOSIS — E1122 Type 2 diabetes mellitus with diabetic chronic kidney disease: Secondary | ICD-10-CM | POA: Diagnosis present

## 2016-02-28 DIAGNOSIS — Z79899 Other long term (current) drug therapy: Secondary | ICD-10-CM

## 2016-02-28 DIAGNOSIS — E1169 Type 2 diabetes mellitus with other specified complication: Secondary | ICD-10-CM

## 2016-02-28 LAB — I-STAT TROPONIN, ED
TROPONIN I, POC: 0 ng/mL (ref 0.00–0.08)
Troponin i, poc: 0 ng/mL (ref 0.00–0.08)

## 2016-02-28 LAB — CBC
HCT: 33.8 % — ABNORMAL LOW (ref 36.0–46.0)
HEMOGLOBIN: 11 g/dL — AB (ref 12.0–15.0)
MCH: 28.5 pg (ref 26.0–34.0)
MCHC: 32.5 g/dL (ref 30.0–36.0)
MCV: 87.6 fL (ref 78.0–100.0)
PLATELETS: 342 10*3/uL (ref 150–400)
RBC: 3.86 MIL/uL — ABNORMAL LOW (ref 3.87–5.11)
RDW: 13.9 % (ref 11.5–15.5)
WBC: 10.6 10*3/uL — ABNORMAL HIGH (ref 4.0–10.5)

## 2016-02-28 LAB — COMPREHENSIVE METABOLIC PANEL
ALBUMIN: 3.7 g/dL (ref 3.5–5.0)
ALK PHOS: 85 U/L (ref 38–126)
ALT: 17 U/L (ref 14–54)
ANION GAP: 13 (ref 5–15)
AST: 23 U/L (ref 15–41)
BILIRUBIN TOTAL: 0.8 mg/dL (ref 0.3–1.2)
BUN: 21 mg/dL — ABNORMAL HIGH (ref 6–20)
CALCIUM: 8.9 mg/dL (ref 8.9–10.3)
CO2: 22 mmol/L (ref 22–32)
CREATININE: 1.22 mg/dL — AB (ref 0.44–1.00)
Chloride: 102 mmol/L (ref 101–111)
GFR calc non Af Amer: 50 mL/min — ABNORMAL LOW (ref >60)
GFR, EST AFRICAN AMERICAN: 58 mL/min — AB (ref >60)
GLUCOSE: 337 mg/dL — AB (ref 65–99)
Potassium: 3.5 mmol/L (ref 3.5–5.1)
Sodium: 137 mmol/L (ref 135–145)
TOTAL PROTEIN: 7.9 g/dL (ref 6.5–8.1)

## 2016-02-28 LAB — LIPASE, BLOOD: LIPASE: 24 U/L (ref 11–51)

## 2016-02-28 LAB — MAGNESIUM: MAGNESIUM: 1.2 mg/dL — AB (ref 1.7–2.4)

## 2016-02-28 MED ORDER — DIPHENHYDRAMINE HCL 50 MG/ML IJ SOLN
25.0000 mg | Freq: Once | INTRAMUSCULAR | Status: AC
  Administered 2016-02-28: 25 mg via INTRAVENOUS
  Filled 2016-02-28: qty 1

## 2016-02-28 MED ORDER — HALOPERIDOL LACTATE 5 MG/ML IJ SOLN
5.0000 mg | Freq: Once | INTRAMUSCULAR | Status: DC
  Filled 2016-02-28: qty 1

## 2016-02-28 MED ORDER — ONDANSETRON HCL 4 MG/2ML IJ SOLN
4.0000 mg | Freq: Once | INTRAMUSCULAR | Status: AC
  Administered 2016-02-28: 4 mg via INTRAVENOUS
  Filled 2016-02-28: qty 2

## 2016-02-28 MED ORDER — ONDANSETRON 4 MG PO TBDP
4.0000 mg | ORAL_TABLET | Freq: Once | ORAL | Status: AC | PRN
  Administered 2016-02-28: 4 mg via ORAL
  Filled 2016-02-28: qty 1

## 2016-02-28 MED ORDER — HYDROMORPHONE HCL 2 MG/ML IJ SOLN
1.0000 mg | Freq: Once | INTRAMUSCULAR | Status: AC
  Administered 2016-02-28: 1 mg via INTRAVENOUS
  Filled 2016-02-28: qty 1

## 2016-02-28 MED ORDER — FENTANYL CITRATE (PF) 100 MCG/2ML IJ SOLN
50.0000 ug | Freq: Once | INTRAMUSCULAR | Status: AC
  Administered 2016-02-28: 50 ug via INTRAVENOUS
  Filled 2016-02-28: qty 2

## 2016-02-28 MED ORDER — METOCLOPRAMIDE HCL 5 MG/ML IJ SOLN
5.0000 mg | Freq: Once | INTRAMUSCULAR | Status: AC
  Administered 2016-02-28: 5 mg via INTRAVENOUS
  Filled 2016-02-28: qty 2

## 2016-02-28 MED ORDER — SODIUM CHLORIDE 0.9 % IV BOLUS (SEPSIS)
2000.0000 mL | Freq: Once | INTRAVENOUS | Status: AC
  Administered 2016-02-28: 2000 mL via INTRAVENOUS

## 2016-02-28 MED ORDER — HALOPERIDOL LACTATE 5 MG/ML IJ SOLN
2.0000 mg | Freq: Once | INTRAMUSCULAR | Status: AC
  Administered 2016-02-28: 2 mg via INTRAVENOUS

## 2016-02-28 NOTE — ED Notes (Signed)
No respiratory or acute distress noted alert and oriented x 3 no reaction to medication noted visitor at bedside.

## 2016-02-28 NOTE — ED Notes (Signed)
No respiratory or acute distress noted alert and oriented x 3 no reaction to medication noted call light in reach visitors at bedside.

## 2016-02-28 NOTE — H&P (Signed)
Deborah [name] WHQ:[id number] DOB: January 15, 1966 DOA: 02/28/2016     PCP: Nolon Bussing, PA  currently states has no PCP because no insurance, goes to [facility] when she has to Outpatient Specialists: none   Patient coming from:     home Lives With family    Chief Complaint: Rash or vomiting generalized abdominal pain  HPI: Deborah [name] is a 50 y.o. female with medical history significant of diabetes mellitus type 2 controlled with recurrent DKA, gastroparesis, fibromyalgia current chest pain, stroke, obesity, CKD, HTN     Presented with 24 hours of nausea vomiting and abdominal pain typical for her gastroparesis patient came in requesting admission. Reports that she has burning pain in her chest up to her throat which is associated nausea and vomiting. Denies fevers or chills or urinary complaints She reports chest pain today describes as like stabbing in the center of her chest similar to prior episodes.    Patient has been admitted for chest pain in October she has had repeated visits to [facility] for recurrent chest pain associated right shoulder pain she reports many episodes of vomiting cannot remember how many no diarrhea but had some soft stools no black stools no blood in stools no blood in vomit. Denies sick contacts. Reports have been feeling weak adwobly with trouble ambulating due to generalized weakness.  Regarding pertinent Chronic problems: She has DM poorly controlled reports taking her insulin She states it runs between 100 to 200. She does not check hr blood pressure at home.   IN ER:  Temp (24hrs), Avg:97.9 F (36.6 C), Min:97.7 F (36.5 C), Max:98 F (36.7 C)    Respirations 1698% anemia HR106 BP 134/94  Troponin 0.00 lipase 24  WBC 10.6 hemoglobin 11 Creatinine 1.22 which is up from baseline of 0.97 potassium 3.5 glucose 337  Chest x-ray negative   Following Medications were ordered in ER: Medications  ondansetron  (ZOFRAN-ODT) disintegrating tablet 4 mg (4 mg Oral Given 02/28/16 1546)  HYDROmorphone (DILAUDID) injection 1 mg (1 mg Intravenous Given 02/28/16 1826)  metoCLOPramide (REGLAN) injection 5 mg (5 mg Intravenous Given 02/28/16 1826)  diphenhydrAMINE (BENADRYL) injection 25 mg (25 mg Intravenous Given 02/28/16 2013)  haloperidol lactate (HALDOL) injection 2 mg (2 mg Intravenous Given 02/28/16 2013)  sodium chloride 0.9 % bolus 2,000 mL (2,000 mLs Intravenous New Bag/Given 02/28/16 2114)  ondansetron (ZOFRAN) injection 4 mg (4 mg Intravenous Given 02/28/16 2140)  fentaNYL (SUBLIMAZE) injection 50 mcg (50 mcg Intravenous Given 02/28/16 2140)     Hospitalist was called for admission for Gastroparesis with intractable nausea and vomiting  Review of Systems:    Pertinent positives include: fatigue,  abdominal pain, nausea, vomiting, headaches,  Constitutional:  No weight loss, night sweats, Fevers, chills,  weight loss  HEENT:  No  Difficulty swallowing,Tooth/dental problems,Sore throat,  No sneezing, itching, ear ache, nasal congestion, post nasal drip,  Cardio-vascular:  No chest pain, Orthopnea, PND, anasarca, dizziness, palpitations.no Bilateral lower extremity swelling  GI:  No heartburn, indigestion,diarrhea, change in bowel habits, loss of appetite, melena, blood in stool, hematemesis Resp:  no shortness of breath at rest. No dyspnea on exertion, No excess mucus, no productive cough, No non-productive cough, No coughing up of blood.No change in color of mucus.No wheezing. Skin:  no rash or lesions. No jaundice GU:  no dysuria, change in color of urine, no urgency or frequency. No straining to urinate.  No flank pain.  Musculoskeletal:  No joint pain or no  joint swelling. No decreased range of motion. No back pain.  Psych:  No change in mood or affect. No depression or anxiety. No memory loss.  Neuro: no localizing neurological complaints, no tingling, no weakness, no double vision,  no gait abnormality, no slurred speech, no confusion  As per HPI otherwise 10 point review of systems negative.   Past Medical History: Past Medical History:  Diagnosis Date  . Chest pain 12/2015  . Diabetes mellitus   . Fibromyalgia   . Gastric ulcer   . Gastroparesis   . Hypertension   . Obesity   . Pneumonia   . Stroke [facility]) 02/2011   Past Surgical History:  Procedure Laterality Date  . CATARACT EXTRACTION  01/2014  . CHOLECYSTECTOMY       Social History:  Ambulatory   independently      reports that she has never smoked. She has never used smokeless tobacco. She reports that she does not drink alcohol or use drugs.  Allergies:   Allergies  Allergen Reactions  . Lisinopril Anaphylaxis    Tongue and mouth swelling  . Penicillins Palpitations    Has patient had a PCN reaction causing immediate rash, facial/tongue/throat swelling, SOB or lightheadedness with hypotension: Yes Has patient had a PCN reaction causing severe rash involving mucus membranes or skin necrosis: No Has patient had a PCN reaction that required hospitalization: Yes  Has patient had a PCN reaction occurring within the last 10 years: No   . Valium [Diazepam] [name]  . Aspirin Other (See Comments)    Irritates stomach ulcer   . Food Hives and Swelling    Carrots, ketchup   . Nsaids Other (See Comments)    ULCER  . Tylenol [Acetaminophen] Other (See Comments)    Irritates stomach ulcer   . Morphine And Related Palpitations       Family History:   Family History  Problem Relation Age of Onset  . Diabetes Mother   . Diabetes Father   . Heart disease Father   . Diabetes Sister   . Congestive Heart Failure Sister 9  . Diabetes Brother     Medications: Prior to Admission medications   Medication Sig Start Date End Date Taking? Authorizing Provider  allopurinol (ZYLOPRIM) 100 MG tablet Take 1 tablet (100 mg total) by mouth daily. 09/23/15  Yes Debbe Odea, MD    amLODipine (NORVASC) 10 MG tablet Take 1 tablet (10 mg total) by mouth daily. 09/23/15  Yes Debbe Odea, MD  dicyclomine (BENTYL) 20 MG tablet Take 1 tablet (20 mg total) by mouth 2 (two) times daily. 01/14/16  Yes Gareth Morgan, MD  DULoxetine (CYMBALTA) 30 MG capsule Take 1 capsule (30 mg total) by mouth 2 (two) times daily. 09/23/15  Yes Debbe Odea, MD  metoCLOPramide (REGLAN) 10 MG tablet Take 1 tablet (10 mg total) by mouth every 6 (six) hours. 01/14/16  Yes Gareth Morgan, MD  metoprolol succinate (TOPROL-XL) 25 MG 24 hr tablet Take 1 tablet (25 mg total) by mouth daily. 09/23/15  Yes Debbe Odea, MD  nitroGLYCERIN (NITROSTAT) 0.4 MG SL tablet Place 1 tablet (0.4 mg total) under the tongue every 5 (five) minutes as needed for chest pain. 12/20/15  Yes Mauricio Gerome Apley, MD  NOVOLOG MIX 70/30 (70-30) 100 UNIT/ML injection Inject 0.5-0.75 mLs (50-75 Units total) into the skin 3 (three) times daily after meals. 75 units every morning, 60 units with lunch, and 50 units with dinner 09/23/15  Yes Debbe Odea, MD  ondansetron (ZOFRAN) 4 MG tablet Take 1 tablet (4 mg total) by mouth every 8 (eight) hours as needed for nausea or vomiting. 12/20/15  Yes Mauricio Gerome Apley, MD  oxyCODONE (OXY IR/ROXICODONE) 5 MG immediate release tablet Take 1 tablet (5 mg total) by mouth every 8 (eight) hours as needed for moderate pain or severe pain. 12/20/15  Yes Mauricio Gerome Apley, MD  pantoprazole (PROTONIX) 40 MG tablet Take 1 tablet (40 mg total) by mouth 2 (two) times daily. 09/23/15  Yes Debbe Odea, MD  pravastatin (PRAVACHOL) 10 MG tablet Take 1 tablet (10 mg total) by mouth daily. 09/23/15  Yes Debbe Odea, MD  senna (SENOKOT) 8.6 MG tablet Take 1 tablet (8.6 mg total) by mouth 2 (two) times daily. Patient taking differently: Take 1 tablet by mouth daily as needed for constipation.  09/23/15 09/22/16 Yes Debbe Odea, MD  Vitamin D, Ergocalciferol, (DRISDOL) 50000 units CAPS capsule Take 1 capsule  (50,000 Units total) by mouth every 7 (seven) days. Patient taking differently: Take 50,000 Units by mouth every 14 (fourteen) days.  09/23/15  Yes Debbe Odea, MD  albuterol (PROVENTIL HFA;VENTOLIN HFA) 108 (90 Base) MCG/ACT inhaler Inhale 2 puffs into the lungs every 6 (six) hours as needed for wheezing or shortness of breath. Patient not taking: Reported on 02/28/2016 09/23/15   Debbe Odea, MD  folic acid (FOLVITE) 1 MG tablet Take 1 tablet (1 mg total) by mouth daily. Patient not taking: Reported on 02/28/2016 12/10/15   Mir Marry Guan, MD  pantoprazole (PROTONIX) 40 MG tablet Take 1 tablet (40 mg total) by mouth 2 (two) times daily. Patient not taking: Reported on 02/28/2016 01/14/16   Gareth Morgan, MD    Physical Exam: Patient Vitals for the past 24 hrs:  BP Temp Temp src Pulse Resp SpO2 Height Weight  02/28/16 2152 134/95 - - 106 16 98 % - -  02/28/16 2101 117/88 - - 115 10 95 % - -  02/28/16 2012 126/93 - - 103 10 95 % - -  02/28/16 1929 104/79 97.7 F (36.5 C) Oral 113 22 100 % - -  02/28/16 1830 115/78 - - 108 21 100 % - -  02/28/16 1801 - - - - - - 5\' 6"  (1.676 m) (!) 137 kg (302 lb)  02/28/16 1800 102/67 - - 109 (!) 29 100 % - -  02/28/16 1727 131/95 - - 120 16 98 % - -  02/28/16 1544 - - - (!) 122 - - - -  02/28/16 1541 104/79 98 F (36.7 C) Oral (!) 131 24 99 % - -    1. General:  in No Acute distress 2. Psychological: Alert and  Oriented 3. Head/ENT:     Dry Mucous Membranes                          Head Non traumatic, neck supple                           Poor Dentition 4. SKIN:  decreased Skin turgor,  Skin clean Dry and intact no rash 5. Heart: Regular rate and rhythm no Murmur, Rub or gallop 6. Lungs:  Clear to auscultation bilaterally, no wheezes or crackles   7. Abdomen: Soft, epigastric mild tenderness, Non distended 8. Lower extremities: no clubbing, cyanosis, or edema 9. Neurologically Grossly intact, moving all 4 extremities equally  10. MSK:  Normal range of motion  body mass index is 48.74 kg/m.  Labs on Admission:   Labs on Admission: I have personally reviewed following labs and imaging studies  CBC:  Recent Labs Lab 02/28/16 1553  WBC 10.6*  HGB 11.0*  HCT 33.8*  MCV 87.6  PLT 454   Basic Metabolic Panel:  Recent Labs Lab 02/28/16 1553  NA 137  K 3.5  CL 102  CO2 22  GLUCOSE 337*  BUN 21*  CREATININE 1.22*  CALCIUM 8.9   GFR: Estimated Creatinine Clearance: 77.9 mL/min (by C-G formula based on SCr of 1.22 mg/dL (H)). Liver Function Tests:  Recent Labs Lab 02/28/16 1553  AST 23  ALT 17  ALKPHOS 85  BILITOT 0.8  PROT 7.9  ALBUMIN 3.7    Recent Labs Lab 02/28/16 1553  LIPASE 24   No results for input(s): AMMONIA in the last 168 hours. Coagulation Profile: No results for input(s): INR, PROTIME in the last 168 hours. Cardiac Enzymes: No results for input(s): CKTOTAL, CKMB, CKMBINDEX, TROPONINI in the last 168 hours. BNP (last 3 results) No results for input(s): PROBNP in the last 8760 hours. HbA1C: No results for input(s): HGBA1C in the last 72 hours. CBG: No results for input(s): GLUCAP in the last 168 hours. Lipid Profile: No results for input(s): CHOL, HDL, LDLCALC, TRIG, CHOLHDL, LDLDIRECT in the last 72 hours. Thyroid Function Tests: No results for input(s): TSH, T4TOTAL, FREET4, T3FREE, THYROIDAB in the last 72 hours. Anemia Panel: No results for input(s): VITAMINB12, FOLATE, FERRITIN, TIBC, IRON, RETICCTPCT in the last 72 hours. Urine analysis:    Component Value Date/Time   COLORURINE YELLOW 01/14/2016 [place] 01/14/2016 1056   LABSPEC >1.046 (H) 01/14/2016 1056   PHURINE 5.5 01/14/2016 1056   GLUCOSEU >1000 (A) 01/14/2016 1056   HGBUR TRACE (A) 01/14/2016 1056   BILIRUBINUR NEGATIVE 01/14/2016 1056   KETONESUR 40 (A) 01/14/2016 1056   PROTEINUR 30 (A) 01/14/2016 1056   UROBILINOGEN 0.2 10/02/2013 2108   NITRITE NEGATIVE 01/14/2016 1056    LEUKOCYTESUR NEGATIVE 01/14/2016 1056   Sepsis Labs: @LABRCNTIP (procalcitonin:4,lacticidven:4) )No results found for this or any previous visit (from the past 240 hour(s)).    UA  no evidence of UTI   Lab Results  Component Value Date   HGBA1C 9.9 (H) 12/06/2015    Estimated Creatinine Clearance: 77.9 mL/min (by C-G formula based on SCr of 1.22 mg/dL (H)).  BNP (last 3 results) No results for input(s): PROBNP in the last 8760 hours.   ECG REPORT  Independently reviewed Rate: 130  Rhythm: sinus tachycardia ST&T Change: No acute ischemic changes  QTC 452  Filed Weights   02/28/16 1801  Weight: (!) 137 kg (302 lb)     Cultures:    Component Value Date/Time   SDES URINE, CLEAN CATCH 05/26/2015 1403   SPECREQUEST NONE 05/26/2015 1403   CULT  05/26/2015 1403    MULTIPLE SPECIES PRESENT, SUGGEST RECOLLECTION Performed at [facility] 05/27/2015 FINAL 05/26/2015 1403     Radiological Exams on Admission: Dg Chest 2 View  Result Date: 02/28/2016 CLINICAL DATA:  Right anterior chest pain EXAM: CHEST  2 VIEW COMPARISON:  01/14/2016 FINDINGS: The heart size and mediastinal contours are within normal limits. Both lungs are clear. The visualized skeletal structures are unremarkable. IMPRESSION: No active cardiopulmonary disease. Electronically Signed   By: Jerilynn Mages.  Shick M.D.   On: 02/28/2016 16:23    Chart has been reviewed    Assessment/Plan   50 y.o.  female with medical history significant of diabetes mellitus type 2 controlled with recurrent DKA, gastroparesis, fibromyalgia current chest pain, stroke, obesity, CKD, HTN admitted for dehydration secondary to intractable nausea and vomiting secondary to gastroparesis  Present on Admission: . AKI (acute kidney injury) (HCC)Most likely secondary to dehydration will obtain urine electrolytes  . CKD (chronic kidney disease) stage 3, GFR 30-59 ml/min acute on chronic secondary to dehydration . DM (diabetes  mellitus), type 2, uncontrolled ([place]) Change to Lantus 55 units while hospitalized order sliding scale . Dyslipidemia associated with type 2 diabetes mellitus ([place]) stable continue home medications . Essential hypertension, benign we will continue home medication . Gastroparesis order scheduled Reglan rehydrate avoid IV narcotics as this can exacerbate gastroparesis . Hypokalemia mild will replace check magnesium level . Nausea and vomiting- most likely secondary to recurrent gastroparesis . Dehydration administer IV fluids check orthostatics prior to discharge  Chest pain atypical patient have had recurrent workup last echogram was in October. We'll monitor telemetry cycle cardiac cancers most likely GI related in origin Other plan as per orders.  DVT prophylaxis:    Lovenox     Code Status:  FULL CODE  as per patient    Family Communication:   Family    at  Bedside  plan of care was discussed with Husband,   Disposition Plan:    To home once workup is complete and patient is stable                         Would benefit from PT/OT eval prior to DC ordered                    Diabetes coordinator                          Consults called: none  Admission status:    inpatient      Level of care    tele       I have spent a total of 56 min on this admission    Nayeliz Hipp 02/28/2016, 11:14 PM    [facility]  Pager [phone]   after 2 AM please page floor coverage PA If 7AM-7PM, please contact the day team taking care of the patient  [email or web address]  Password [email or web address]

## 2016-02-28 NOTE — ED Notes (Signed)
Pt doesn't feel the urge to urinate

## 2016-02-28 NOTE — ED Notes (Signed)
Placed on O2 2L due to sats 90% informed provider.

## 2016-02-28 NOTE — ED Notes (Signed)
No respiratory or acute distress noted resting in bed with eyes closed no reaction to medication noted family at bedside call light in reach. 

## 2016-02-28 NOTE — ED Provider Notes (Signed)
[facility] DEPT Provider Note   [name]: [id number] Arrival date & time: 02/28/16  1532     History   Chief Complaint Chief Complaint  Patient presents with  . Abdominal Pain  . Chest Pain  . Emesis    HPI Deborah [name] is a 50 y.o. female.   Patient is a 50 year old female who is known to the ED with history of gastroparesis, diabetes, stroke, obesity present with diffuse abdominal pain, chest pain, emesis since last night at 6 PM. Patient states that she has had similar episodes in the past and treated for gastroparesis. She states that her diffuse abdominal pain is worse with movement, and tender to palpation. She states that she gets a radiation of burning pain up her chest and up to her throat. She states that she also has right shoulder pain associated with it. Patient also reports associated nausea, vomiting since yesterday. She states she's had many episodes of vomiting that she cannot remember many were since yesterday. Patient admits to having a soft bowel movement yesterday prior to symptoms. She denies any blood or red streaks in her vomit. She states that she does not have a primary care physician because she does not have any insurance. Patient denies any fevers, chills, urinary symptoms, or changes in bowel movements, or bloody stool.    Abdominal Pain   Associated symptoms include nausea, vomiting and headaches. Pertinent negatives include fever and dysuria.  Chest Pain   Associated symptoms include abdominal pain, back pain, headaches, nausea, shortness of breath and vomiting. Pertinent negatives include no cough, no fever and no numbness.  Emesis   Associated symptoms include abdominal pain and headaches. Pertinent negatives include no chills, no cough and no fever.    Past Medical History:  Diagnosis Date  . Chest pain 12/2015  . Diabetes mellitus   . Fibromyalgia   . Gastric ulcer   . Gastroparesis   . Hypertension   . Obesity   . Pneumonia   .  Stroke [facility]) 02/2011    Patient Active Problem List   Diagnosis Date Noted  . AKI (acute kidney injury) ([place]) 12/06/2015  . Chest pain 09/26/2015  . Hypokalemia 09/26/2015  . DM (diabetes mellitus), type 2, uncontrolled ([place]) 09/26/2015  . Hypomagnesemia 09/26/2015  . Gastroparesis 09/26/2015  . Gastroparesis 09/21/2015  . Hypokalemia 08/20/2015  . Hypomagnesemia 08/20/2015  . Nausea and vomiting 08/20/2015  . UTI (urinary tract infection) 05/27/2015  . Gout flare 05/27/2015  . Leukocytosis 05/26/2015  . Nausea with vomiting 05/26/2015  . Abdominal pain 05/26/2015  . [name] 05/26/2015  . DKA (diabetic ketoacidoses) ([place]) 05/25/2015  . Uncontrolled type 2 diabetes mellitus with diabetic neuropathy, with long-term current use of insulin ([place]) 05/25/2015  . Dyslipidemia associated with type 2 diabetes mellitus ([place]) 05/25/2015  . CKD (chronic kidney disease) stage 3, GFR 30-59 ml/min 05/25/2015  . Essential hypertension, benign 09/28/2013    Past Surgical History:  Procedure Laterality Date  . CATARACT EXTRACTION  01/2014  . CHOLECYSTECTOMY      OB History    No data available       Home Medications    Prior to Admission medications   Medication Sig Start Date End Date Taking? Authorizing Provider  allopurinol (ZYLOPRIM) 100 MG tablet Take 1 tablet (100 mg total) by mouth daily. 09/23/15  Yes Debbe Odea, MD  amLODipine (NORVASC) 10 MG tablet Take 1 tablet (10 mg total) by mouth daily. 09/23/15  Yes Debbe Odea, MD  dicyclomine (  BENTYL) 20 MG tablet Take 1 tablet (20 mg total) by mouth 2 (two) times daily. 01/14/16  Yes Gareth Morgan, MD  DULoxetine (CYMBALTA) 30 MG capsule Take 1 capsule (30 mg total) by mouth 2 (two) times daily. 09/23/15  Yes Debbe Odea, MD  metoCLOPramide (REGLAN) 10 MG tablet Take 1 tablet (10 mg total) by mouth every 6 (six) hours. 01/14/16  Yes Gareth Morgan, MD  metoprolol succinate (TOPROL-XL) 25 MG 24 hr tablet Take 1 tablet (25  mg total) by mouth daily. 09/23/15  Yes Debbe Odea, MD  nitroGLYCERIN (NITROSTAT) 0.4 MG SL tablet Place 1 tablet (0.4 mg total) under the tongue every 5 (five) minutes as needed for chest pain. 12/20/15  Yes Mauricio Gerome Apley, MD  NOVOLOG MIX 70/30 (70-30) 100 UNIT/ML injection Inject 0.5-0.75 mLs (50-75 Units total) into the skin 3 (three) times daily after meals. 75 units every morning, 60 units with lunch, and 50 units with dinner 09/23/15  Yes Debbe Odea, MD  ondansetron (ZOFRAN) 4 MG tablet Take 1 tablet (4 mg total) by mouth every 8 (eight) hours as needed for nausea or vomiting. 12/20/15  Yes Mauricio Gerome Apley, MD  oxyCODONE (OXY IR/ROXICODONE) 5 MG immediate release tablet Take 1 tablet (5 mg total) by mouth every 8 (eight) hours as needed for moderate pain or severe pain. 12/20/15  Yes Mauricio Gerome Apley, MD  pantoprazole (PROTONIX) 40 MG tablet Take 1 tablet (40 mg total) by mouth 2 (two) times daily. 09/23/15  Yes Debbe Odea, MD  pravastatin (PRAVACHOL) 10 MG tablet Take 1 tablet (10 mg total) by mouth daily. 09/23/15  Yes Debbe Odea, MD  senna (SENOKOT) 8.6 MG tablet Take 1 tablet (8.6 mg total) by mouth 2 (two) times daily. Patient taking differently: Take 1 tablet by mouth daily as needed for constipation.  09/23/15 09/22/16 Yes Debbe Odea, MD  Vitamin D, Ergocalciferol, (DRISDOL) 50000 units CAPS capsule Take 1 capsule (50,000 Units total) by mouth every 7 (seven) days. Patient taking differently: Take 50,000 Units by mouth every 14 (fourteen) days.  09/23/15  Yes Debbe Odea, MD  albuterol (PROVENTIL HFA;VENTOLIN HFA) 108 (90 Base) MCG/ACT inhaler Inhale 2 puffs into the lungs every 6 (six) hours as needed for wheezing or shortness of breath. Patient not taking: Reported on 02/28/2016 09/23/15   Debbe Odea, MD  folic acid (FOLVITE) 1 MG tablet Take 1 tablet (1 mg total) by mouth daily. Patient not taking: Reported on 02/28/2016 12/10/15   Mir Marry Guan, MD    pantoprazole (PROTONIX) 40 MG tablet Take 1 tablet (40 mg total) by mouth 2 (two) times daily. Patient not taking: Reported on 02/28/2016 01/14/16   Gareth Morgan, MD    Family History Family History  Problem Relation Age of Onset  . Diabetes Mother   . Diabetes Father   . Heart disease Father   . Diabetes Sister   . Congestive Heart Failure Sister 28  . Diabetes Brother     Social History Social History  Substance Use Topics  . Smoking status: Never Smoker  . Smokeless tobacco: Never Used  . Alcohol use No     Allergies   Lisinopril; Penicillins; Valium [diazepam]; Aspirin; Food; Nsaids; Tylenol [acetaminophen]; and Morphine and related   Review of Systems Review of Systems  Constitutional: Negative for chills and fever.  HENT: Negative for congestion, sore throat and trouble swallowing.   Eyes: Negative for visual disturbance.  Respiratory: Positive for shortness of breath. Negative for cough.   Cardiovascular: Positive  for chest pain.  Gastrointestinal: Positive for abdominal pain, nausea and vomiting.  Genitourinary: Negative for difficulty urinating and dysuria.  Musculoskeletal: Positive for back pain.  Skin: Negative for wound.  Neurological: Positive for headaches. Negative for numbness.     Physical Exam Updated Vital Signs BP 117/88   Pulse 115   Temp 97.7 F (36.5 C) (Oral)   Resp 10   Ht 5\' 6"  (1.676 m)   Wt (!) 137 kg   LMP 10/10/2012   SpO2 95%   BMI 48.74 kg/m   Physical Exam  Constitutional: She is oriented to person, place, and time. She appears well-developed and well-nourished.  HENT:  Head: Normocephalic and atraumatic.  Nose: Nose normal.  Mouth/Throat: Oropharynx is clear and moist.  Eyes: Conjunctivae and EOM are normal. Pupils are equal, round, and reactive to light.  Neck: Normal range of motion. Neck supple.  Cardiovascular: Normal heart sounds and intact distal pulses.  Tachycardia present.   Pulmonary/Chest: Effort  normal and breath sounds normal. No respiratory distress. She exhibits no tenderness.  Abdominal: Soft. There is tenderness. There is no rebound.  Musculoskeletal: Normal range of motion.  Neurological: She is alert and oriented to person, place, and time.  Skin: Skin is warm. Capillary refill takes less than 2 seconds.  Psychiatric: She has a normal mood and affect. Her behavior is normal.  Nursing note and vitals reviewed.    ED Treatments / Results  Labs (all labs ordered are listed, but only abnormal results are displayed) Labs Reviewed  COMPREHENSIVE METABOLIC PANEL - Abnormal; Notable for the following:       Result Value   Glucose, Bld 337 (*)    BUN 21 (*)    Creatinine, Ser 1.22 (*)    GFR calc non Af Amer 50 (*)    GFR calc Af Amer 58 (*)    All other components within normal limits  CBC - Abnormal; Notable for the following:    WBC 10.6 (*)    RBC 3.86 (*)    Hemoglobin 11.0 (*)    HCT 33.8 (*)    All other components within normal limits  LIPASE, BLOOD  URINALYSIS, ROUTINE W REFLEX MICROSCOPIC  I-STAT TROPOININ, ED    EKG  EKG Interpretation None       Radiology Dg Chest 2 View  Result Date: 02/28/2016 CLINICAL DATA:  Right anterior chest pain EXAM: CHEST  2 VIEW COMPARISON:  01/14/2016 FINDINGS: The heart size and mediastinal contours are within normal limits. Both lungs are clear. The visualized skeletal structures are unremarkable. IMPRESSION: No active cardiopulmonary disease. Electronically Signed   By: Jerilynn Mages.  Shick M.D.   On: 02/28/2016 16:23    Procedures Procedures (including critical care time)  Medications Ordered in ED Medications  ondansetron (ZOFRAN-ODT) disintegrating tablet 4 mg (4 mg Oral Given 02/28/16 1546)  HYDROmorphone (DILAUDID) injection 1 mg (1 mg Intravenous Given 02/28/16 1826)  metoCLOPramide (REGLAN) injection 5 mg (5 mg Intravenous Given 02/28/16 1826)  diphenhydrAMINE (BENADRYL) injection 25 mg (25 mg Intravenous Given  02/28/16 2013)  haloperidol lactate (HALDOL) injection 2 mg (2 mg Intravenous Given 02/28/16 2013)  sodium chloride 0.9 % bolus 2,000 mL (2,000 mLs Intravenous New Bag/Given 02/28/16 2114)  ondansetron (ZOFRAN) injection 4 mg (4 mg Intravenous Given 02/28/16 2140)  fentaNYL (SUBLIMAZE) injection 50 mcg (50 mcg Intravenous Given 02/28/16 2140)     Initial Impression / Assessment and Plan / ED Course  I have reviewed the triage vital signs and the nursing  notes.  Pertinent labs & imaging results that were available during my care of the patient were reviewed by me and considered in my medical decision making (see chart for details).  Clinical Course   Patient is a 50 year old female who is known to the ED with history of chronic gastroparesis complaining of diffuse abdominal pain, chest pain, emesis. Patient's states her symptoms are similar to her previous episodes of gastroparesis. On exam patient is afebrile, hemodynamically stable, with tachycardia. Heart and lung sounds are clear. Abdomen soft and tender throughout.  EKG does not show any acute findings. Chest x-ray negative for any acute findings.  Troponin negative. Lab work shows increased BUN, and creatinine increased at 1.22.  Patient's symptoms managed in ED. Patient also seen by Dr. Ellender Hose.     At shift change care was transferred to Dr. Ellender Hose who will follow pending studies, re-evaulate and determine disposition.    Final Clinical Impressions(s) / ED Diagnoses   Final diagnoses:  None    New Prescriptions New Prescriptions   No medications on file     [place], [place] 02/28/16 2142    Duffy Bruce, MD 03/01/16 1135

## 2016-02-28 NOTE — ED Notes (Signed)
Pt aware we need urine sample.  

## 2016-02-28 NOTE — ED Triage Notes (Signed)
Pt c/o generalized abdominal pain, central chest pain, and n/v starting last night.  Pain score 10/10.  Pt has not taken anything for symptoms.  Hx of gastroparesis.

## 2016-02-29 DIAGNOSIS — I10 Essential (primary) hypertension: Secondary | ICD-10-CM

## 2016-02-29 DIAGNOSIS — R112 Nausea with vomiting, unspecified: Secondary | ICD-10-CM

## 2016-02-29 DIAGNOSIS — N182 Chronic kidney disease, stage 2 (mild): Secondary | ICD-10-CM

## 2016-02-29 DIAGNOSIS — E860 Dehydration: Secondary | ICD-10-CM

## 2016-02-29 DIAGNOSIS — E785 Hyperlipidemia, unspecified: Secondary | ICD-10-CM

## 2016-02-29 DIAGNOSIS — E876 Hypokalemia: Secondary | ICD-10-CM

## 2016-02-29 DIAGNOSIS — E1165 Type 2 diabetes mellitus with hyperglycemia: Secondary | ICD-10-CM

## 2016-02-29 DIAGNOSIS — Z794 Long term (current) use of insulin: Secondary | ICD-10-CM

## 2016-02-29 DIAGNOSIS — K3184 Gastroparesis: Secondary | ICD-10-CM

## 2016-02-29 DIAGNOSIS — E1122 Type 2 diabetes mellitus with diabetic chronic kidney disease: Secondary | ICD-10-CM

## 2016-02-29 DIAGNOSIS — E1169 Type 2 diabetes mellitus with other specified complication: Secondary | ICD-10-CM

## 2016-02-29 DIAGNOSIS — N183 Chronic kidney disease, stage 3 (moderate): Secondary | ICD-10-CM

## 2016-02-29 DIAGNOSIS — R0789 Other chest pain: Secondary | ICD-10-CM

## 2016-02-29 LAB — URINALYSIS, ROUTINE W REFLEX MICROSCOPIC
Bilirubin Urine: NEGATIVE
Glucose, UA: 50 mg/dL — AB
Ketones, ur: NEGATIVE mg/dL
Nitrite: NEGATIVE
PROTEIN: 100 mg/dL — AB
Specific Gravity, Urine: 1.024 (ref 1.005–1.030)
pH: 5 (ref 5.0–8.0)

## 2016-02-29 LAB — COMPREHENSIVE METABOLIC PANEL
ALK PHOS: 77 U/L (ref 38–126)
ALT: 16 U/L (ref 14–54)
AST: 17 U/L (ref 15–41)
Albumin: 3.3 g/dL — ABNORMAL LOW (ref 3.5–5.0)
Anion gap: 9 (ref 5–15)
BILIRUBIN TOTAL: 0.6 mg/dL (ref 0.3–1.2)
BUN: 25 mg/dL — AB (ref 6–20)
CALCIUM: 8.5 mg/dL — AB (ref 8.9–10.3)
CO2: 25 mmol/L (ref 22–32)
CREATININE: 1.19 mg/dL — AB (ref 0.44–1.00)
Chloride: 106 mmol/L (ref 101–111)
GFR, EST NON AFRICAN AMERICAN: 52 mL/min — AB (ref >60)
Glucose, Bld: 283 mg/dL — ABNORMAL HIGH (ref 65–99)
Potassium: 3.6 mmol/L (ref 3.5–5.1)
Sodium: 140 mmol/L (ref 135–145)
TOTAL PROTEIN: 7.1 g/dL (ref 6.5–8.1)

## 2016-02-29 LAB — CBC
HCT: 30.7 % — ABNORMAL LOW (ref 36.0–46.0)
Hemoglobin: 10 g/dL — ABNORMAL LOW (ref 12.0–15.0)
MCH: 28.9 pg (ref 26.0–34.0)
MCHC: 32.6 g/dL (ref 30.0–36.0)
MCV: 88.7 fL (ref 78.0–100.0)
Platelets: 297 10*3/uL (ref 150–400)
RBC: 3.46 MIL/uL — AB (ref 3.87–5.11)
RDW: 14 % (ref 11.5–15.5)
WBC: 9.2 10*3/uL (ref 4.0–10.5)

## 2016-02-29 LAB — MAGNESIUM: Magnesium: 1.2 mg/dL — ABNORMAL LOW (ref 1.7–2.4)

## 2016-02-29 LAB — GLUCOSE, CAPILLARY
GLUCOSE-CAPILLARY: 313 mg/dL — AB (ref 65–99)
GLUCOSE-CAPILLARY: 309 mg/dL — AB (ref 65–99)
Glucose-Capillary: 305 mg/dL — ABNORMAL HIGH (ref 65–99)

## 2016-02-29 LAB — CREATININE, URINE, RANDOM: Creatinine, Urine: <10 mg/dL

## 2016-02-29 LAB — RAPID URINE DRUG SCREEN, HOSP PERFORMED
AMPHETAMINES: NOT DETECTED
BENZODIAZEPINES: NOT DETECTED
Barbiturates: NOT DETECTED
Cocaine: NOT DETECTED
OPIATES: POSITIVE — AB
TETRAHYDROCANNABINOL: NOT DETECTED

## 2016-02-29 LAB — TROPONIN I

## 2016-02-29 LAB — D-DIMER, QUANTITATIVE: D-Dimer, Quant: 0.34 ug/mL-FEU (ref 0.00–0.50)

## 2016-02-29 LAB — SODIUM, URINE, RANDOM: Sodium, Ur: 19 mmol/L

## 2016-02-29 LAB — PHOSPHORUS: Phosphorus: 3.9 mg/dL (ref 2.5–4.6)

## 2016-02-29 LAB — TSH: TSH: 2.4 u[IU]/mL (ref 0.350–4.500)

## 2016-02-29 LAB — CBG MONITORING, ED: GLUCOSE-CAPILLARY: 284 mg/dL — AB (ref 65–99)

## 2016-02-29 MED ORDER — DICYCLOMINE HCL 20 MG PO TABS
20.0000 mg | ORAL_TABLET | Freq: Two times a day (BID) | ORAL | Status: DC
  Administered 2016-02-29 – 2016-03-02 (×5): 20 mg via ORAL
  Filled 2016-02-29 (×6): qty 1

## 2016-02-29 MED ORDER — METOPROLOL SUCCINATE ER 25 MG PO TB24
25.0000 mg | ORAL_TABLET | Freq: Every day | ORAL | Status: DC
  Administered 2016-02-29 – 2016-03-02 (×3): 25 mg via ORAL
  Filled 2016-02-29 (×4): qty 1

## 2016-02-29 MED ORDER — OXYCODONE HCL 5 MG PO TABS
5.0000 mg | ORAL_TABLET | ORAL | Status: DC | PRN
  Administered 2016-02-29 – 2016-03-02 (×10): 5 mg via ORAL
  Filled 2016-02-29 (×10): qty 1

## 2016-02-29 MED ORDER — SODIUM CHLORIDE 0.9% FLUSH
3.0000 mL | Freq: Two times a day (BID) | INTRAVENOUS | Status: DC
  Administered 2016-02-29 – 2016-03-02 (×5): 3 mL via INTRAVENOUS

## 2016-02-29 MED ORDER — SODIUM CHLORIDE 0.9 % IV SOLN: 30.0000 meq | Freq: Once | INTRAVENOUS | Status: DC

## 2016-02-29 MED ORDER — AMLODIPINE BESYLATE 10 MG PO TABS
10.0000 mg | ORAL_TABLET | Freq: Every day | ORAL | Status: DC
  Administered 2016-02-29 – 2016-03-02 (×3): 10 mg via ORAL
  Filled 2016-02-29 (×3): qty 1

## 2016-02-29 MED ORDER — ALLOPURINOL 100 MG PO TABS
100.0000 mg | ORAL_TABLET | Freq: Every day | ORAL | Status: DC
  Administered 2016-02-29 – 2016-03-02 (×3): 100 mg via ORAL
  Filled 2016-02-29 (×4): qty 1

## 2016-02-29 MED ORDER — POTASSIUM CHLORIDE 10 MEQ/100ML IV SOLN
10.0000 meq | INTRAVENOUS | Status: AC
  Administered 2016-02-29 (×3): 10 meq via INTRAVENOUS
  Filled 2016-02-29 (×3): qty 100

## 2016-02-29 MED ORDER — INSULIN ASPART 100 UNIT/ML ~~LOC~~ SOLN
0.0000 [IU] | SUBCUTANEOUS | Status: DC
  Administered 2016-02-29: 5 [IU] via SUBCUTANEOUS
  Administered 2016-02-29: 3 [IU] via SUBCUTANEOUS
  Administered 2016-02-29: 7 [IU] via SUBCUTANEOUS
  Filled 2016-02-29: qty 1

## 2016-02-29 MED ORDER — SODIUM CHLORIDE 0.9 % IV SOLN
INTRAVENOUS | Status: AC
  Administered 2016-02-29: 04:00:00 via INTRAVENOUS

## 2016-02-29 MED ORDER — COLCHICINE 0.6 MG PO TABS
0.6000 mg | ORAL_TABLET | Freq: Every day | ORAL | Status: DC
  Administered 2016-02-29 – 2016-03-01 (×2): 0.6 mg via ORAL
  Filled 2016-02-29 (×2): qty 1

## 2016-02-29 MED ORDER — ONDANSETRON HCL 4 MG PO TABS
4.0000 mg | ORAL_TABLET | Freq: Four times a day (QID) | ORAL | Status: DC | PRN
  Administered 2016-03-01: 4 mg via ORAL
  Filled 2016-02-29: qty 1

## 2016-02-29 MED ORDER — ONDANSETRON HCL 4 MG/2ML IJ SOLN
4.0000 mg | Freq: Four times a day (QID) | INTRAMUSCULAR | Status: DC | PRN
  Administered 2016-02-29: 4 mg via INTRAVENOUS
  Filled 2016-02-29: qty 2

## 2016-02-29 MED ORDER — DULOXETINE HCL 30 MG PO CPEP
30.0000 mg | ORAL_CAPSULE | Freq: Two times a day (BID) | ORAL | Status: DC
  Administered 2016-02-29 – 2016-03-02 (×5): 30 mg via ORAL
  Filled 2016-02-29 (×5): qty 1

## 2016-02-29 MED ORDER — INSULIN GLARGINE 100 UNIT/ML ~~LOC~~ SOLN
55.0000 [IU] | Freq: Every day | SUBCUTANEOUS | Status: DC
  Administered 2016-02-29 – 2016-03-01 (×3): 55 [IU] via SUBCUTANEOUS
  Filled 2016-02-29 (×4): qty 0.55

## 2016-02-29 MED ORDER — PRAVASTATIN SODIUM 20 MG PO TABS
10.0000 mg | ORAL_TABLET | Freq: Every day | ORAL | Status: DC
  Administered 2016-02-29 – 2016-03-02 (×3): 10 mg via ORAL
  Filled 2016-02-29 (×4): qty 1

## 2016-02-29 MED ORDER — INSULIN ASPART 100 UNIT/ML ~~LOC~~ SOLN
0.0000 [IU] | Freq: Three times a day (TID) | SUBCUTANEOUS | Status: DC
  Administered 2016-02-29: 11 [IU] via SUBCUTANEOUS
  Administered 2016-03-01: 8 [IU] via SUBCUTANEOUS
  Administered 2016-03-01 – 2016-03-02 (×4): 2 [IU] via SUBCUTANEOUS

## 2016-02-29 MED ORDER — METOCLOPRAMIDE HCL 5 MG/ML IJ SOLN
5.0000 mg | Freq: Three times a day (TID) | INTRAMUSCULAR | Status: DC
  Administered 2016-02-29 – 2016-03-02 (×8): 5 mg via INTRAVENOUS
  Filled 2016-02-29 (×8): qty 2

## 2016-02-29 MED ORDER — ENOXAPARIN SODIUM 60 MG/0.6ML ~~LOC~~ SOLN
60.0000 mg | SUBCUTANEOUS | Status: DC
  Administered 2016-02-29 – 2016-03-02 (×3): 60 mg via SUBCUTANEOUS
  Filled 2016-02-29 (×4): qty 0.6

## 2016-02-29 MED ORDER — MAGNESIUM SULFATE 2 GM/50ML IV SOLN
2.0000 g | Freq: Once | INTRAVENOUS | Status: AC
  Administered 2016-02-29: 2 g via INTRAVENOUS
  Filled 2016-02-29: qty 50

## 2016-02-29 NOTE — Progress Notes (Signed)
Lovenox per Pharmacy for DVT Prophylaxis    Pharmacy has been consulted from dosing enoxaparin (lovenox) in this patient for DVT prophylaxis.  The pharmacist has reviewed pertinent labs (Hgb _11__; PLT_342__), patient weight (_137__kg) and renal function (CrCl_~78__mL/min) and decided that enoxaparin _60 mg SQ Q_24_Hrs is appropriate for this patient.  The pharmacy department will sign off at this time.  Please reconsult pharmacy if status changes or for further issues.  Thank you  Cyndia Diver PharmD, BCPS  02/29/2016, 3:20 AM

## 2016-02-29 NOTE — ED Notes (Signed)
Patient taken to floor by RN

## 2016-02-29 NOTE — Evaluation (Signed)
Physical Therapy Evaluation Patient Details Name: Deborah [name] MRN: [id number] DOB: 1966-03-08 Today's Date: 02/29/2016   History of Present Illness  Pt admitted through ED with N/V and generalized weakness.  Pt also c/o low back pain and LE pain/weakness exacerbated by ambulation and relieved by forward flexion  Clinical Impression  Pt admitted as above and presenting with functional mobility limitations 2* back pain and LE weakness/pain (worsens with increased time upright and decreases with fwd flex) and ambulatory instability.  Pt would benefit from use of RW at home to increase safety with mobility.    Follow Up Recommendations Home health PT    Equipment Recommendations  Rolling walker with 5" wheels (Wide RW - pt is over 300 lbs)    Recommendations for Other Services       Precautions / Restrictions Precautions Precautions: Fall Restrictions Weight Bearing Restrictions: No      Mobility  Bed Mobility Overal bed mobility: Modified Independent                Transfers Overall transfer level: Needs assistance Equipment used: Rolling walker (2 wheeled) Transfers: Sit to/from Stand Sit to Stand: Min guard;From elevated surface         General transfer comment: steady assist on initial standing  Ambulation/Gait Ambulation/Gait assistance: Min assist;Supervision Ambulation Distance (Feet): 260 Feet Assistive device: Rolling walker (2 wheeled);None Gait Pattern/deviations: Step-through pattern;Decreased step length - right;Decreased step length - left;Shuffle;Trunk flexed;Wide base of support Gait velocity: decr Gait velocity interpretation: Below normal speed for age/gender General Gait Details: Pt ambulated 40' with RW and min guard.  Pt ambulated additional 20' with HHA and noted decreased pace and increased instability and tentativeness.  Pt ambulated additional 200' with RW, sup, cues for position from RW and several short rests standing in fwd flex  with elbows/hands on RW.    Stairs            Wheelchair Mobility    Modified Rankin (Stroke Patients Only)       Balance Overall balance assessment: Needs assistance Sitting-balance support: No upper extremity supported;Feet supported Sitting balance-Leahy Scale: Good     Standing balance support: No upper extremity supported Standing balance-Leahy Scale: Fair                               Pertinent Vitals/Pain Pain Assessment: 0-10 Pain Score: 7  Pain Location: back and bil LE Pain Descriptors / Indicators: Aching Pain Intervention(s): Limited activity within patient's tolerance;Monitored during session    [place] expects to be discharged to:: Private residence Living Arrangements: Spouse/significant other Available Help at Discharge: Family Type of Home: House Home Access: Stairs to enter     Home Layout: One level Home Equipment: Radio producer - single point      Prior Function Level of Independence: Independent with assistive device(s)         Comments: Pt mobilizes ltd distances with cane - states does not feel secure 2* increased back pain and LE weakness with increased time standing/ambulating     Hand Dominance        Extremity/Trunk Assessment   Upper Extremity Assessment Upper Extremity Assessment: Overall WFL for tasks assessed    Lower Extremity Assessment Lower Extremity Assessment: Generalized weakness    Cervical / Trunk Assessment Cervical / Trunk Assessment: Lordotic  Communication   Communication: No difficulties  Cognition Arousal/Alertness: Awake/alert Behavior During Therapy: WFL for tasks assessed/performed Overall Cognitive Status:  Within Functional Limits for tasks assessed                      General Comments      Exercises     Assessment/Plan    PT Assessment Patient needs continued PT services  PT Problem List Decreased strength;Decreased range of motion;Decreased activity  tolerance;Decreased balance;Decreased mobility;Decreased knowledge of use of DME;Obesity;Pain          PT Treatment Interventions DME instruction;Gait training;Stair training;Functional mobility training;Therapeutic activities;Therapeutic exercise;Patient/family education    PT Goals (Current goals can be found in the Care Plan section)  Acute Rehab PT Goals Patient Stated Goal: Find out what is causing my back and leg pain so I can be more active PT Goal Formulation: With patient Time For Goal Achievement: 03/06/16 Potential to Achieve Goals: Good    Frequency Min 3X/week   Barriers to discharge        Co-evaluation               End of Session   Activity Tolerance: Patient tolerated treatment well;Patient limited by fatigue Patient left: in chair;with call bell/phone within reach;with family/visitor present Nurse Communication: Mobility status         Time: 1535-1558 PT Time Calculation (min) (ACUTE ONLY): 23 min   Charges:   PT Evaluation $PT Eval Low Complexity: 1 Procedure PT Treatments $Gait Training: 8-22 mins   PT G Codes:        Kupono Marling 01-Mar-2016, 6:03 PM

## 2016-02-29 NOTE — Progress Notes (Signed)
CBG tonight is 309.  No SSI order.  I have contacted [facility] on call and am awaiting a response

## 2016-02-29 NOTE — ED Notes (Signed)
Report called to floor rn

## 2016-02-29 NOTE — ED Notes (Signed)
No reaction to medication noted resting in bed with eyes closed call light in reach visitor at bedside.

## 2016-02-29 NOTE — ED Notes (Signed)
No respiratory or acute distress noted resting in bed with eyes closed call light in reach no reaction to medication noted visitor at bedside.

## 2016-02-29 NOTE — Progress Notes (Signed)
PROGRESS NOTE    Deborah [name]  ZRA:[id number] DOB: 1966/01/05 DOA: 02/28/2016 PCP: Nolon Bussing, PA   Brief Narrative:  Deborah [name] is a 50 y.o. female with medical history significant of Diabetes Mellitus Type 2 controlled with recurrent DKA, Gastroparesis, Fibromyalgia, Recurrent chest pain, Stroke, Obesity, CKD Stage 3, HTN and other comorbidities who presented to [facility] with 24 hours of Nausea/Vomiting and Abdominal Pain typical for her Gastroparesis. Patient was admitted for Dehydration 2/2 to Intractable Nausea/Vomitng 2/2 to Gastroparesis as well as Atypical Chest Pain r/o ACS. Patient stated she also thought her Gout in her Left big toe was flaring.   Assessment & Plan:   Active Problems:   Essential hypertension, benign   Dyslipidemia associated with type 2 diabetes mellitus (HCC)   CKD (chronic kidney disease) stage 3, GFR 30-59 ml/min   Hypokalemia   Nausea and vomiting   Gastroparesis   Chest pain   DM (diabetes mellitus), type 2, uncontrolled (HCC)   Gastroparesis   AKI (acute kidney injury) ([place])   Dehydration  Nausea/Vomiting 2/2 to Gastroparesis from Diabetes Mellitus  -C/w Metoclopramide 5 mg q8h -C/w Zofran 4 mg po/IV q6hprn for Nausea -C/w Dicyclomine 20 mg po BID -Patient is s/p 2 L Bolus -Continue to Monitor -WBC went from 10.6 -> 9.2  -Avoid IV Narcotics  -C/w Clear Liquid Diet and Advance as Tolerated  Atypical CP r/o ACS -POC Troponin 0.00 x 2 and Troponin I <0.03 x 2 -Continue to monitor as it appears to be more Gastric Pain -C/w Metoprolol Succinate 25 mg po Daily and with Pravastatin 10 mg po Daily  Uncontrolled Diabetes Mellitus Type 2 complicated by Neuropathy and Gastroparesis -C/w Lantus 55 units sq qHS and Increase Sensitive Novolog SSI to Resistant Novolog SSI -C/w Duloxetine 30 mg po BID -Check HbA1c; Last HbA1c was 9.9 in September -CBG's ranging from 284-305 -Appreciate Diabetic Education Coordinator  Evaluation  Hypomagnesemia -Patient's Magnesium Level was 1.2 -Replete -Repeat Mag Level in Am  CKD (Chronic Kidney Disease) stage 3, GFR 30-59 ml/min  -Acute on chronic secondary to dehydration -Patient's BUN/Cr went from 21/1.22 -> 25/1.19 -Patient's Urine Na+ was 19 and Urine Cr was <10 -S/p 2 Liter Fluid Resuscitation  Dyslipidemia associated withType 2 Diabetes Mellitus -Stable -C/w Pravastatin 10 mg po Daily  Essential Hypertension -C/w Amlodipine 10 mg po daily and with Metoprolol Succinate 25 mg po Daily -Continue to Monitor  Gout -Patient thinks she may be starting an acute flare -C/w Allopurinol 100 mg po Daily and Start Colchicine 0.6 mg po Daily  Fibromyalgia -C/w Duloxetine 30 mg pog BID  Dehydration -As Above   DVT prophylaxis: Lovenox 60 mg sq Code Status: FULL CODE Family Communication: No Family present at bedside Disposition Plan: Home when Stable pending PT consultation  Consultants:  None  Procedures: None  Antimicrobials: None  Subjective: Seen and examined at bedside and stated Nausea and vomiting improved. Still complained of Abdominal Pain stated that was her biggest complaint but states it was no where near as bad as where it was when she was admitted. States she also had some Cp. No SOB or other complaints.   Objective: Vitals:   02/29/16 1248 02/29/16 1251 02/29/16 1253 02/29/16 1255  BP: 120/74 133/77 (!) 139/112 131/84  Pulse: 97 93 (!) 105 (!) 110  Resp: 16     Temp: 97.7 F (36.5 C)     TempSrc: Oral     SpO2: 100%     Weight:  Height:        Intake/Output Summary (Last 24 hours) at 02/29/16 1533 Last data filed at 02/29/16 7169  Gross per 24 hour  Intake                0 ml  Output              300 ml  Net             -300 ml   Filed Weights   02/28/16 1801 02/29/16 0911  Weight: (!) 137 kg (302 lb) (!) 136.2 kg (300 lb 4.3 oz)   Examination: Physical Exam:  Constitutional: WN/WD, morbidly obese AAF NAD and  appears calm and comfortable sitting edge of bed Eyes:  Lids and conjunctivae normal, sclerae anicteric  ENMT: External Ears, Nose appear normal. Grossly normal hearing.  Neck: Appears normal, supple, no cervical masses, normal ROM, no appreciable thyromegaly, no JVD Respiratory: Clear to auscultation bilaterally, no wheezing, rales, rhonchi or crackles. Normal respiratory effort and patient is not tachypenic. No accessory muscle use.  Cardiovascular: RRR, no murmurs / rubs / gallops. S1 and S2 auscultated. Non pitting extremity edema noted.  Abdomen: Soft, mildly tender to palpate, distended 2/2 to body habitus. No masses palpated. No appreciable hepatosplenomegaly. Bowel sounds positive x4.  GU: Deferred. Musculoskeletal: No clubbing / cyanosis of digits/nails. No joint deformity upper and lower extremities.  Skin: No rashes, lesions, ulcers. No induration; Warm and dry. Left Big toe painful to palpate.  Neurologic: CN 2-12 grossly intact with no focal deficits. Sensation intact in all 4 Extremities. Romberg sign cerebellar reflexes not assessed.  Psychiatric: Normal judgment and insight. Alert and oriented x 3. Normal mood and appropriate affect.   Data Reviewed: I have personally reviewed following labs and imaging studies  CBC:  Recent Labs Lab 02/28/16 1553 02/29/16 0459  WBC 10.6* 9.2  HGB 11.0* 10.0*  HCT 33.8* 30.7*  MCV 87.6 88.7  PLT 342 678   Basic Metabolic Panel:  Recent Labs Lab 02/28/16 1553 02/29/16 0459  NA 137 140  K 3.5 3.6  CL 102 106  CO2 22 25  GLUCOSE 337* 283*  BUN 21* 25*  CREATININE 1.22* 1.19*  CALCIUM 8.9 8.5*  MG 1.2* 1.2*  PHOS  --  3.9   GFR: Estimated Creatinine Clearance: 79.6 mL/min (by C-G formula based on SCr of 1.19 mg/dL (H)). Liver Function Tests:  Recent Labs Lab 02/28/16 1553 02/29/16 0459  AST 23 17  ALT 17 16  ALKPHOS 85 77  BILITOT 0.8 0.6  PROT 7.9 7.1  ALBUMIN 3.7 3.3*    Recent Labs Lab 02/28/16 1553   LIPASE 24   No results for input(s): AMMONIA in the last 168 hours. Coagulation Profile: No results for input(s): INR, PROTIME in the last 168 hours. Cardiac Enzymes:  Recent Labs Lab 02/29/16 0459 02/29/16 0947  TROPONINI <0.03 <0.03   BNP (last 3 results) No results for input(s): PROBNP in the last 8760 hours. HbA1C: No results for input(s): HGBA1C in the last 72 hours. CBG:  Recent Labs Lab 02/29/16 0404 02/29/16 1154  GLUCAP 284* 305*   Lipid Profile: No results for input(s): CHOL, HDL, LDLCALC, TRIG, CHOLHDL, LDLDIRECT in the last 72 hours. Thyroid Function Tests:  Recent Labs  02/29/16 0456  TSH 2.400   Anemia Panel: No results for input(s): VITAMINB12, FOLATE, FERRITIN, TIBC, IRON, RETICCTPCT in the last 72 hours. Sepsis Labs: No results for input(s): PROCALCITON, LATICACIDVEN in the last 168 hours.  No  results found for this or any previous visit (from the past 240 hour(s)).   Radiology Studies: Dg Chest 2 View  Result Date: 02/28/2016 CLINICAL DATA:  Right anterior chest pain EXAM: CHEST  2 VIEW COMPARISON:  01/14/2016 FINDINGS: The heart size and mediastinal contours are within normal limits. Both lungs are clear. The visualized skeletal structures are unremarkable. IMPRESSION: No active cardiopulmonary disease. Electronically Signed   By: Jerilynn Mages.  Shick M.D.   On: 02/28/2016 16:23   Scheduled Meds: . allopurinol  100 mg Oral Daily  . amLODipine  10 mg Oral Daily  . colchicine  0.6 mg Oral Daily  . dicyclomine  20 mg Oral BID  . DULoxetine  30 mg Oral BID  . enoxaparin (LOVENOX) injection  60 mg Subcutaneous Q24H  . insulin aspart  0-9 Units Subcutaneous Q4H  . insulin glargine  55 Units Subcutaneous QHS  . metoCLOPramide (REGLAN) injection  5 mg Intravenous Q8H  . metoprolol succinate  25 mg Oral Daily  . pravastatin  10 mg Oral Daily  . sodium chloride flush  3 mL Intravenous Q12H   Continuous Infusions:   LOS: 1 day    Kerney Elbe,  DO [facility] Pager [phone]  If 7PM-7AM, please contact night-coverage [email or web address] Password [facility] 02/29/2016, 3:33 PM

## 2016-03-01 DIAGNOSIS — M797 Fibromyalgia: Secondary | ICD-10-CM

## 2016-03-01 LAB — COMPREHENSIVE METABOLIC PANEL
ALK PHOS: 77 U/L (ref 38–126)
ALT: 17 U/L (ref 14–54)
ANION GAP: 8 (ref 5–15)
AST: 18 U/L (ref 15–41)
Albumin: 3.4 g/dL — ABNORMAL LOW (ref 3.5–5.0)
BILIRUBIN TOTAL: 0.5 mg/dL (ref 0.3–1.2)
BUN: 20 mg/dL (ref 6–20)
CALCIUM: 8.8 mg/dL — AB (ref 8.9–10.3)
CO2: 23 mmol/L (ref 22–32)
Chloride: 109 mmol/L (ref 101–111)
Creatinine, Ser: 0.99 mg/dL (ref 0.44–1.00)
Glucose, Bld: 173 mg/dL — ABNORMAL HIGH (ref 65–99)
Potassium: 3.4 mmol/L — ABNORMAL LOW (ref 3.5–5.1)
Sodium: 140 mmol/L (ref 135–145)
TOTAL PROTEIN: 6.7 g/dL (ref 6.5–8.1)

## 2016-03-01 LAB — CBC WITH DIFFERENTIAL/PLATELET
Basophils Absolute: 0 10*3/uL (ref 0.0–0.1)
Basophils Relative: 1 %
EOS ABS: 0.3 10*3/uL (ref 0.0–0.7)
Eosinophils Relative: 3 %
HEMATOCRIT: 28.9 % — AB (ref 36.0–46.0)
HEMOGLOBIN: 9.4 g/dL — AB (ref 12.0–15.0)
LYMPHS ABS: 3.1 10*3/uL (ref 0.7–4.0)
Lymphocytes Relative: 39 %
MCH: 28.8 pg (ref 26.0–34.0)
MCHC: 32.5 g/dL (ref 30.0–36.0)
MCV: 88.7 fL (ref 78.0–100.0)
MONOS PCT: 6 %
Monocytes Absolute: 0.5 10*3/uL (ref 0.1–1.0)
NEUTROS ABS: 4.1 10*3/uL (ref 1.7–7.7)
NEUTROS PCT: 51 %
Platelets: 309 10*3/uL (ref 150–400)
RBC: 3.26 MIL/uL — ABNORMAL LOW (ref 3.87–5.11)
RDW: 13.9 % (ref 11.5–15.5)
WBC: 7.9 10*3/uL (ref 4.0–10.5)

## 2016-03-01 LAB — GLUCOSE, CAPILLARY
GLUCOSE-CAPILLARY: 147 mg/dL — AB (ref 65–99)
Glucose-Capillary: 128 mg/dL — ABNORMAL HIGH (ref 65–99)
Glucose-Capillary: 269 mg/dL — ABNORMAL HIGH (ref 65–99)
Glucose-Capillary: 240 mg/dL — ABNORMAL HIGH (ref 65–99)

## 2016-03-01 LAB — MAGNESIUM: Magnesium: 1.6 mg/dL — ABNORMAL LOW (ref 1.7–2.4)

## 2016-03-01 LAB — PHOSPHORUS: PHOSPHORUS: 3.5 mg/dL (ref 2.5–4.6)

## 2016-03-01 MED ORDER — POTASSIUM CHLORIDE CRYS ER 20 MEQ PO TBCR
40.0000 meq | EXTENDED_RELEASE_TABLET | Freq: Two times a day (BID) | ORAL | Status: DC
  Administered 2016-03-01 – 2016-03-02 (×3): 40 meq via ORAL
  Filled 2016-03-01 (×3): qty 2

## 2016-03-01 MED ORDER — MAGNESIUM SULFATE 2 GM/50ML IV SOLN
2.0000 g | Freq: Once | INTRAVENOUS | Status: AC
  Administered 2016-03-01: 2 g via INTRAVENOUS
  Filled 2016-03-01: qty 50

## 2016-03-01 NOTE — Progress Notes (Signed)
PROGRESS NOTE    Deborah [name]  ZSW:[id number] DOB: Jan 29, 1966 DOA: 02/28/2016 PCP: Nolon Bussing, PA   Brief Narrative:  Deborah [name] is a 50 y.o. female with medical history significant of Diabetes Mellitus Type 2 controlled with recurrent DKA, Gastroparesis, Fibromyalgia, Recurrent chest pain, Stroke, Obesity, CKD Stage 3, HTN and other comorbidities who presented to [facility] with 24 hours of Nausea/Vomiting and Abdominal Pain typical for her Gastroparesis. Patient was admitted for Dehydration 2/2 to Intractable Nausea/Vomitng 2/2 to Gastroparesis as well as Atypical Chest Pain r/o ACS. Patient stated she also thought her Gout in her Left big toe was flaring. She was seen and examined this AM and states she felt weak and had pain in her legs typical of her fibromyalgia pain. No N/V and wanted to see if she could eat more than a clear liquid diet without vomiting.   Assessment & Plan:   Active Problems:   Essential hypertension, benign   Dyslipidemia associated with type 2 diabetes mellitus (HCC)   CKD (chronic kidney disease) stage 3, GFR 30-59 ml/min   Hypokalemia   Nausea and vomiting   Gastroparesis   Chest pain   DM (diabetes mellitus), type 2, uncontrolled (HCC)   Gastroparesis   AKI (acute kidney injury) ([place])   Dehydration  Nausea/Vomiting 2/2 to Gastroparesis from Diabetes Mellitus  -C/w Metoclopramide 5 mg q8h IV -C/w Zofran 4 mg po/IV q6hprn for Nausea -C/w Dicyclomine 20 mg po BID -Patient is s/p 2 L Bolus -Continue to Monitor -WBC went from 10.6 -> 9.2  -Avoid IV Narcotics  -Advance as Tolerated to Soft Heart Healthy Carb Modified Diet  Atypical CP r/o'd ACS; Appears to Be Gastric Pain -POC Troponin 0.00 x 2 and Troponin I <0.03 x 2 -Not in Much Gastric Pain today -C/w Metoprolol Succinate 25 mg po Daily and with Pravastatin 10 mg po Daily  Uncontrolled Diabetes Mellitus Type 2 complicated by Neuropathy and Gastroparesis -C/w Lantus 55 units sq  qHS and Increase Sensitive Novolog SSI to Moderate Novolog SSI -C/w Duloxetine 30 mg po BID -Check HbA1c; Last HbA1c was 9.9 in September -CBG's ranging from 147-269 -Appreciate Diabetic Education Coordinator Evaluation  Hypomagnesemia -Patient's Magnesium Level was 1.6 -Replete with IV Mag Sulfate -Repeat Mag Level in Am  Hypokalemia -Patient's K+ was 3.4 this AM -Replete -Repeat CMP in AM  CKD (Chronic Kidney Disease) stage 3, GFR 30-59 ml/min  -Acute on chronic secondary to dehydration -Patient's BUN/Cr went from 21/1.22 -> 25/1.19 -> 20/0.99 -Patient's Urine Na+ was 19 and Urine Cr was <10 -S/p 2 Liter Fluid Resuscitation  Dyslipidemia associated withType 2 Diabetes Mellitus -Stable -C/w Pravastatin 10 mg po Daily  Essential Hypertension -C/w Amlodipine 10 mg po daily and with Metoprolol Succinate 25 mg po Daily -Continue to Monitor  Gout -Patient thinks she may be starting an acute flare -C/w Allopurinol 100 mg po Daily; D/C'd Colchicine 0.6 mg po Daily  Fibromyalgia -Had Leg Pain Similar to Fibromyalgia pain in Past. Discontinued Colchicine because of Interaction with Pravastatin causing leg weakness and cramps. -C/w Duloxetine 30 mg pog BID  Dehydration -As Above   DVT prophylaxis: Lovenox 60 mg sq Code Status: FULL CODE Family Communication: Husband at Bedside Disposition Plan: [facility] with [place] with 5" Wheels  Consultants:  None  Procedures: None  Antimicrobials: None  Subjective: Seen and examined at bedside and stated Nausea and vomiting improved and her biggest complaint was her pain in legs today typical of fibromyalgia. No CP/Abdominal Pain but  states she has only been on clear liquid diet so it was advanced as tolerated. No other concerns or complaints and states she feels comfortable walking with a walker with a seat.   Objective: Vitals:   03/01/16 0527 03/01/16 1025 03/01/16 1101 03/01/16 1421  BP: 116/61 120/62 114/71 (!)  142/78  Pulse: 87 84 89 93  Resp: 16  18 18   Temp: 98.2 F (36.8 C)  98.4 F (36.9 C) 97.5 F (36.4 C)  TempSrc: Oral  Oral Oral  SpO2: 98%  100% 100%  Weight:      Height:        Intake/Output Summary (Last 24 hours) at 03/01/16 1847 Last data filed at 02/29/16 2300  Gross per 24 hour  Intake              960 ml  Output                0 ml  Net              960 ml   Filed Weights   02/28/16 1801 02/29/16 0911  Weight: (!) 137 kg (302 lb) (!) 136.2 kg (300 lb 4.3 oz)   Examination: Physical Exam:  Constitutional: WN/WD, morbidly obese AAF NAD and appears calm and comfortable sitting edge of bed Eyes:  Lids and conjunctivae normal, sclerae anicteric  ENMT: External Ears, Nose appear normal. Grossly normal hearing.  Neck: Appears normal, supple, no cervical masses, normal ROM, no appreciable thyromegaly, no JVD Respiratory: Clear to auscultation bilaterally, no wheezing, rales, rhonchi or crackles. Normal respiratory effort and patient is not tachypenic. No accessory muscle use.  Cardiovascular: RRR, no murmurs / rubs / gallops. S1 and S2 auscultated. Non pitting extremity edema noted.  Abdomen: Soft, non-tender to palpate, distended 2/2 to body habitus. No masses palpated. No appreciable hepatosplenomegaly. Bowel sounds positive x4.  GU: Deferred. Musculoskeletal: No clubbing / cyanosis of digits/nails. No joint deformity upper and lower extremities.  Skin: No rashes, lesions, ulcers. No induration; Warm and dry. Left Big toe not as painful. Neurologic: CN 2-12 grossly intact with no focal deficits. Sensation intact in all 4 Extremities. Romberg sign cerebellar reflexes not assessed.  Psychiatric: Normal judgment and insight. Alert and oriented x 3. Normal mood and appropriate affect.   Data Reviewed: I have personally reviewed following labs and imaging studies  CBC:  Recent Labs Lab 02/28/16 1553 02/29/16 0459 03/01/16 0805  WBC 10.6* 9.2 7.9  NEUTROABS  --   --   4.1  HGB 11.0* 10.0* 9.4*  HCT 33.8* 30.7* 28.9*  MCV 87.6 88.7 88.7  PLT 342 297 315   Basic Metabolic Panel:  Recent Labs Lab 02/28/16 1553 02/29/16 0459 03/01/16 0805  NA 137 140 140  K 3.5 3.6 3.4*  CL 102 106 109  CO2 22 25 23   GLUCOSE 337* 283* 173*  BUN 21* 25* 20  CREATININE 1.22* 1.19* 0.99  CALCIUM 8.9 8.5* 8.8*  MG 1.2* 1.2* 1.6*  PHOS  --  3.9 3.5   GFR: Estimated Creatinine Clearance: 95.6 mL/min (by C-G formula based on SCr of 0.99 mg/dL). Liver Function Tests:  Recent Labs Lab 02/28/16 1553 02/29/16 0459 03/01/16 0805  AST 23 17 18   ALT 17 16 17   ALKPHOS 85 77 77  BILITOT 0.8 0.6 0.5  PROT 7.9 7.1 6.7  ALBUMIN 3.7 3.3* 3.4*    Recent Labs Lab 02/28/16 1553  LIPASE 24   No results for input(s): AMMONIA in the last 168  hours. Coagulation Profile: No results for input(s): INR, PROTIME in the last 168 hours. Cardiac Enzymes:  Recent Labs Lab 02/29/16 0459 02/29/16 0947 02/29/16 1456  TROPONINI <0.03 <0.03 <0.03   BNP (last 3 results) No results for input(s): PROBNP in the last 8760 hours. HbA1C: No results for input(s): HGBA1C in the last 72 hours. CBG:  Recent Labs Lab 02/29/16 1628 02/29/16 2133 03/01/16 0741 03/01/16 1133 03/01/16 1658  GLUCAP 313* 309* 147* 128* 269*   Lipid Profile: No results for input(s): CHOL, HDL, LDLCALC, TRIG, CHOLHDL, LDLDIRECT in the last 72 hours. Thyroid Function Tests:  Recent Labs  02/29/16 0456  TSH 2.400   Anemia Panel: No results for input(s): VITAMINB12, FOLATE, FERRITIN, TIBC, IRON, RETICCTPCT in the last 72 hours. Sepsis Labs: No results for input(s): PROCALCITON, LATICACIDVEN in the last 168 hours.  No results found for this or any previous visit (from the past 240 hour(s)).   Radiology Studies: No results found. Scheduled Meds: . allopurinol  100 mg Oral Daily  . amLODipine  10 mg Oral Daily  . dicyclomine  20 mg Oral BID  . DULoxetine  30 mg Oral BID  . enoxaparin  (LOVENOX) injection  60 mg Subcutaneous Q24H  . insulin aspart  0-15 Units Subcutaneous TID WC  . insulin glargine  55 Units Subcutaneous QHS  . metoCLOPramide (REGLAN) injection  5 mg Intravenous Q8H  . metoprolol succinate  25 mg Oral Daily  . potassium chloride  40 mEq Oral BID  . pravastatin  10 mg Oral Daily  . sodium chloride flush  3 mL Intravenous Q12H   Continuous Infusions:   LOS: 2 days    Kerney Elbe, DO [facility] Pager [phone]  If 7PM-7AM, please contact night-coverage [email or web address] Password [facility] 03/01/2016, 6:47 PM

## 2016-03-02 LAB — GLUCOSE, CAPILLARY
Glucose-Capillary: 138 mg/dL — ABNORMAL HIGH (ref 65–99)
Glucose-Capillary: 139 mg/dL — ABNORMAL HIGH (ref 65–99)

## 2016-03-02 LAB — PHOSPHORUS: Phosphorus: 2.7 mg/dL (ref 2.5–4.6)

## 2016-03-02 LAB — CBC WITH DIFFERENTIAL/PLATELET
BASOS ABS: 0 10*3/uL (ref 0.0–0.1)
Basophils Relative: 0 %
Eosinophils Absolute: 0.3 10*3/uL (ref 0.0–0.7)
Eosinophils Relative: 3 %
HEMATOCRIT: 28.4 % — AB (ref 36.0–46.0)
HEMOGLOBIN: 9.5 g/dL — AB (ref 12.0–15.0)
LYMPHS PCT: 38 %
Lymphs Abs: 2.9 10*3/uL (ref 0.7–4.0)
MCH: 29.7 pg (ref 26.0–34.0)
MCHC: 33.5 g/dL (ref 30.0–36.0)
MCV: 88.8 fL (ref 78.0–100.0)
MONO ABS: 0.4 10*3/uL (ref 0.1–1.0)
Monocytes Relative: 5 %
NEUTROS ABS: 4 10*3/uL (ref 1.7–7.7)
NEUTROS PCT: 54 %
Platelets: 299 10*3/uL (ref 150–400)
RBC: 3.2 MIL/uL — AB (ref 3.87–5.11)
RDW: 13.9 % (ref 11.5–15.5)
WBC: 7.5 10*3/uL (ref 4.0–10.5)

## 2016-03-02 LAB — COMPREHENSIVE METABOLIC PANEL
ALBUMIN: 3.2 g/dL — AB (ref 3.5–5.0)
ALK PHOS: 86 U/L (ref 38–126)
ALT: 17 U/L (ref 14–54)
AST: 20 U/L (ref 15–41)
Anion gap: 5 (ref 5–15)
BILIRUBIN TOTAL: 0.6 mg/dL (ref 0.3–1.2)
BUN: 17 mg/dL (ref 6–20)
CO2: 25 mmol/L (ref 22–32)
CREATININE: 0.97 mg/dL (ref 0.44–1.00)
Calcium: 8.7 mg/dL — ABNORMAL LOW (ref 8.9–10.3)
Chloride: 107 mmol/L (ref 101–111)
GFR calc Af Amer: >60 mL/min (ref >60)
GLUCOSE: 176 mg/dL — AB (ref 65–99)
Potassium: 3.8 mmol/L (ref 3.5–5.1)
Sodium: 137 mmol/L (ref 135–145)
TOTAL PROTEIN: 6.8 g/dL (ref 6.5–8.1)

## 2016-03-02 LAB — HEMOGLOBIN A1C
Hgb A1c MFr Bld: 9.9 % — ABNORMAL HIGH (ref 4.8–5.6)
Hgb A1c MFr Bld: 9.7 % — ABNORMAL HIGH (ref 4.8–5.6)
MEAN PLASMA GLUCOSE: 232 mg/dL
Mean Plasma Glucose: 237 mg/dL

## 2016-03-02 LAB — MAGNESIUM: MAGNESIUM: 1.6 mg/dL — AB (ref 1.7–2.4)

## 2016-03-02 MED ORDER — MAGNESIUM SULFATE 2 GM/50ML IV SOLN
2.0000 g | Freq: Once | INTRAVENOUS | Status: AC
  Administered 2016-03-02: 2 g via INTRAVENOUS
  Filled 2016-03-02: qty 50

## 2016-03-02 NOTE — Discharge Summary (Signed)
Physician Discharge Summary  Deborah [name] JJO:[id number] DOB: 02-18-1966 DOA: 02/28/2016  PCP: Nolon Bussing, PA  Admit date: 02/28/2016 Discharge date: 03/02/2016  Admitted From: Home Disposition:  Home  Recommendations for Outpatient Follow-up:  1. Follow up with PCP in 1-2 weeks - Follow with [facility] at 8:30 2. Follow up with Gastroenterology as an Outpatient - WILL NEED REFERAL  3. Please obtain BMP/CBC in one week  Home Health: YES Equipment/Devices: Conservation officer, nature  Discharge Condition: Stable CODE STATUS: FULL Diet recommendation: Heart Healthy / Carb Modified Soft Diet   Brief/Interim Summary: Deborah Mapes [name] a 50 y.o.femalewith medical history significant of Diabetes Mellitus Type 2controlled with recurrent DKA, Gastroparesis, Fibromyalgia,Recurrent chest pain, Stroke, Obesity, CKD Stage 3, HTN and other comorbidities who presented to [facility] with 24 hours of Nausea/Vomiting and Abdominal Pain typical for her Gastroparesis. Patient was admitted for Dehydration 2/2 to Intractable Nausea/Vomitng 2/2 to Gastroparesis as well as Atypical Chest Pain r/o ACS. Patient stated she also thought her Gout in her Left big toe was flaring. She was seen and examined yesterday AM and states she felt weak and had pain in her legs typical of her fibromyalgia pain. No N/V and wanted to see if she could eat more than a clear liquid diet without vomiting. She tolerated her diet this AM and felt well without pain or nausea. She was medically stable to be D/C'd and Follow up with the [facility] at 8:30 on Thrusday Mar 04, 2016. She will need Outpatient GI Referral.   Discharge Diagnoses:  Active Problems:   Essential hypertension, benign   Dyslipidemia associated with type 2 diabetes mellitus (HCC)   CKD (chronic kidney disease) stage 3, GFR 30-59 ml/min   Hypokalemia   Nausea and vomiting   Gastroparesis   Chest pain   DM (diabetes mellitus), type 2, uncontrolled (HCC)    Gastroparesis   AKI (acute kidney injury) ([place])   Dehydration  Nausea/Vomiting 2/2 to Gastroparesis from Diabetes Mellitus  -C/w Metoclopramide as an outpatient -C/w Zofran 4 mg po/IV q6hprn for Nausea -C/w Dicyclomine 20 mg po BID -Advance as Tolerated to Soft Heart Healthy Carb Modified Diet -Avoid Narcotics -Follow up with Gastroenterology as an outpatient  Atypical CP r/o'd ACS; Appears to Be Gastric Pain -POC Troponin 0.00 x 2 and Troponin I <0.03 x 2 -C/w Metoprolol Succinate 25 mg po Daily and with Pravastatin 10 mg po Daily  Uncontrolled Diabetes Mellitus Type 2 complicated by Neuropathy and Gastroparesis -Continue Home Insulin Regmin -C/w Duloxetine 30 mg po BID -Check HbA1c; Last HbA1c was 9.9 in September -Appreciate Diabetic Education Coordinator Evaluation -Follow up at [facility]  Hypomagnesemia -Patient's Magnesium Level was 1.6 -Replete with IV Mag Sulfate -Repeat Mag Level at [facility]  Hypokalemia -Patient's K+ was 3.8 this AM -Repleted during inpatient -Repeat CMP at [facility]  CKD (Chronic Kidney Disease) stage 3, GFR 30-59 ml/min -Acute on chronic secondary to dehydration -Patient's BUN/Cr went from 21/1.22 -> 25/1.19 -> 20/0.99 -> 17/0.97 -Patient's Urine Na+ was 19 and Urine Cr was <10 -S/p 2 Liter Fluid Resuscitation -Repeat CMP as an outpatient  Dyslipidemia associated withType 2 Diabetes Mellitus -Stable -C/w Pravastatin 10 mg po Daily  Essential Hypertension -C/w Amlodipine 10 mg po daily and with Metoprolol Succinate 25 mg po Daily  Gout -Patient thinks she may be starting an acute flare -C/w Allopurinol 100 mg po Daily -Discuss with Primary about Colchicine if necessary  Fibromyalgia -Had Leg Pain Similar to Fibromyalgia pain in Past. Discontinued Colchicine because  of Interaction with Pravastatin causing leg weakness and cramps. -C/w Duloxetine 30 mg pog BID  Dehydration -As Above  Discharge  Instructions  Discharge Instructions    Call MD for:  difficulty breathing, headache or visual disturbances    Complete by:  As directed    Call MD for:  persistant dizziness or light-headedness    Complete by:  As directed    Call MD for:  persistant nausea and vomiting    Complete by:  As directed    Call MD for:  severe uncontrolled pain    Complete by:  As directed    Call MD for:  temperature >100.4    Complete by:  As directed    Diet - low sodium heart healthy    Complete by:  As directed    Increase activity slowly    Complete by:  As directed      Allergies as of 03/02/2016      Reactions   Lisinopril Anaphylaxis   Tongue and mouth swelling   Penicillins Palpitations   Has patient had a PCN reaction causing immediate rash, facial/tongue/throat swelling, SOB or lightheadedness with hypotension: Yes Has patient had a PCN reaction causing severe rash involving mucus membranes or skin necrosis: No Has patient had a PCN reaction that required hospitalization: Yes  Has patient had a PCN reaction occurring within the last 10 years: No   Valium [diazepam] [name]   Aspirin Other (See Comments)   Irritates stomach ulcer    Food Hives, Swelling   Carrots, ketchup    Nsaids Other (See Comments)   ULCER   Tylenol [acetaminophen] Other (See Comments)   Irritates stomach ulcer    Morphine And Related Palpitations      Medication List    TAKE these medications   albuterol 108 (90 Base) MCG/ACT inhaler Commonly known as:  PROVENTIL HFA;VENTOLIN HFA Inhale 2 puffs into the lungs every 6 (six) hours as needed for wheezing or shortness of breath.   allopurinol 100 MG tablet Commonly known as:  ZYLOPRIM Take 1 tablet (100 mg total) by mouth daily.   amLODipine 10 MG tablet Commonly known as:  NORVASC Take 1 tablet (10 mg total) by mouth daily.   dicyclomine 20 MG tablet Commonly known as:  BENTYL Take 1 tablet (20 mg total) by mouth 2 (two) times daily.    DULoxetine 30 MG capsule Commonly known as:  CYMBALTA Take 1 capsule (30 mg total) by mouth 2 (two) times daily.   folic acid 1 MG tablet Commonly known as:  FOLVITE Take 1 tablet (1 mg total) by mouth daily.   metoCLOPramide 10 MG tablet Commonly known as:  REGLAN Take 1 tablet (10 mg total) by mouth every 6 (six) hours.   metoprolol succinate 25 MG 24 hr tablet Commonly known as:  TOPROL-XL Take 1 tablet (25 mg total) by mouth daily.   nitroGLYCERIN 0.4 MG SL tablet Commonly known as:  NITROSTAT Place 1 tablet (0.4 mg total) under the tongue every 5 (five) minutes as needed for chest pain.   NOVOLOG MIX 70/30 (70-30) 100 UNIT/ML injection Generic drug:  insulin aspart protamine- aspart Inject 0.5-0.75 mLs (50-75 Units total) into the skin 3 (three) times daily after meals. 75 units every morning, 60 units with lunch, and 50 units with dinner   ondansetron 4 MG tablet Commonly known as:  ZOFRAN Take 1 tablet (4 mg total) by mouth every 8 (eight) hours as needed for nausea or vomiting.  oxyCODONE 5 MG immediate release tablet Commonly known as:  Oxy IR/ROXICODONE Take 1 tablet (5 mg total) by mouth every 8 (eight) hours as needed for moderate pain or severe pain.   pantoprazole 40 MG tablet Commonly known as:  PROTONIX Take 1 tablet (40 mg total) by mouth 2 (two) times daily. What changed:  Another medication with the same name was removed. Continue taking this medication, and follow the directions you see here.   pravastatin 10 MG tablet Commonly known as:  PRAVACHOL Take 1 tablet (10 mg total) by mouth daily.   senna 8.6 MG tablet Commonly known as:  SENOKOT Take 1 tablet (8.6 mg total) by mouth 2 (two) times daily. What changed:  when to take this  reasons to take this   Vitamin D (Ergocalciferol) 50000 units Caps capsule Commonly known as:  DRISDOL Take 1 capsule (50,000 Units total) by mouth every 7 (seven) days. What changed:  when to take this             Durable Medical Equipment        Start     Ordered   03/01/16 1119  For home use only DME Walker rolling  Once    Question:  Patient needs a walker to treat with the following condition  Answer:  Generalized weakness   03/01/16 1118     Follow-up Information    [facility] Follow up.   Why:  GO to this clinic Thursday December 28 at 08:30 and ask for an appointment for follow up medical care, an appointment to get a new primary care physician, and an appointment with an insurance navigator to pursue Medicaid. Contact information: [place] [id number] [phone]         Allergies  Allergen Reactions  . Lisinopril Anaphylaxis    Tongue and mouth swelling  . Penicillins Palpitations    Has patient had a PCN reaction causing immediate rash, facial/tongue/throat swelling, SOB or lightheadedness with hypotension: Yes Has patient had a PCN reaction causing severe rash involving mucus membranes or skin necrosis: No Has patient had a PCN reaction that required hospitalization: Yes  Has patient had a PCN reaction occurring within the last 10 years: No   . Valium [Diazepam] [name]  . Aspirin Other (See Comments)    Irritates stomach ulcer   . Food Hives and Swelling    Carrots, ketchup   . Nsaids Other (See Comments)    ULCER  . Tylenol [Acetaminophen] Other (See Comments)    Irritates stomach ulcer   . Morphine And Related Palpitations    Consultations:  None   Procedures/Studies: Dg Chest 2 View  Result Date: 02/28/2016 CLINICAL DATA:  Right anterior chest pain EXAM: CHEST  2 VIEW COMPARISON:  01/14/2016 FINDINGS: The heart size and mediastinal contours are within normal limits. Both lungs are clear. The visualized skeletal structures are unremarkable. IMPRESSION: No active cardiopulmonary disease. Electronically Signed   By: Jerilynn Mages.  Shick M.D.   On: 02/28/2016 16:23     Subjective: Seen  and examined at bedside and was doing well. Had some pain today but tolerated soft diet with no problems. No other concerns or complaints and was advised to follow up with [facility] on Thursday.   Discharge Exam: Vitals:   03/02/16 0606 03/02/16 1340  BP: 122/71 108/61  Pulse: 88 87  Resp: 18 18  Temp: 98 F (36.7 C) 97.8 F (36.6 C)  Vitals:   03/01/16 1421 03/01/16 2233 03/02/16 0606 03/02/16 1340  BP: (!) 142/78 (!) 151/93 122/71 108/61  Pulse: 93 87 88 87  Resp: 18 18 18 18   Temp: 97.5 F (36.4 C) 97.6 F (36.4 C) 98 F (36.7 C) 97.8 F (36.6 C)  TempSrc: Oral  Oral Oral  SpO2: 100% 100% 95% 98%  Weight:      Height:       General: Pt is alert, awake, not in acute distress; Morbidly obese Cardiovascular: RRR, S1/S2 +, no rubs, no gallops Respiratory: CTA bilaterally, no wheezing, no rhonchi Abdominal: Soft, NT, ND, bowel sounds + Extremities: non pitting edeme edema, no cyanosis  The results of significant diagnostics from this hospitalization (including imaging, microbiology, ancillary and laboratory) are listed below for reference.    Microbiology: No results found for this or any previous visit (from the past 240 hour(s)).   Labs: BNP (last 3 results) No results for input(s): BNP in the last 8760 hours. Basic Metabolic Panel:  Recent Labs Lab 02/28/16 1553 02/29/16 0459 03/01/16 0805 03/02/16 0654  NA 137 140 140 137  K 3.5 3.6 3.4* 3.8  CL 102 106 109 107  CO2 22 25 23 25   GLUCOSE 337* 283* 173* 176*  BUN 21* 25* 20 17  CREATININE 1.22* 1.19* 0.99 0.97  CALCIUM 8.9 8.5* 8.8* 8.7*  MG 1.2* 1.2* 1.6* 1.6*  PHOS  --  3.9 3.5 2.7   Liver Function Tests:  Recent Labs Lab 02/28/16 1553 02/29/16 0459 03/01/16 0805 03/02/16 0654  AST 23 17 18 20   ALT 17 16 17 17   ALKPHOS 85 77 77 86  BILITOT 0.8 0.6 0.5 0.6  PROT 7.9 7.1 6.7 6.8  ALBUMIN 3.7 3.3* 3.4* 3.2*    Recent Labs Lab 02/28/16 1553  LIPASE 24   No results for input(s): AMMONIA in  the last 168 hours. CBC:  Recent Labs Lab 02/28/16 1553 02/29/16 0459 03/01/16 0805 03/02/16 0654  WBC 10.6* 9.2 7.9 7.5  NEUTROABS  --   --  4.1 4.0  HGB 11.0* 10.0* 9.4* 9.5*  HCT 33.8* 30.7* 28.9* 28.4*  MCV 87.6 88.7 88.7 88.8  PLT 342 297 309 299   Cardiac Enzymes:  Recent Labs Lab 02/29/16 0459 02/29/16 0947 02/29/16 1456  TROPONINI <0.03 <0.03 <0.03   BNP: Invalid input(s): POCBNP CBG:  Recent Labs Lab 03/01/16 1133 03/01/16 1658 03/01/16 2215 03/02/16 0756 03/02/16 1150  GLUCAP 128* 269* 240* 138* 139*   D-Dimer  Recent Labs  02/29/16 0459  DDIMER 0.34   Hgb A1c  Recent Labs  02/29/16 0459 03/01/16 0805  HGBA1C 9.9* 9.7*   Lipid Profile No results for input(s): CHOL, HDL, LDLCALC, TRIG, CHOLHDL, LDLDIRECT in the last 72 hours. Thyroid function studies  Recent Labs  02/29/16 0456  TSH 2.400   Anemia work up No results for input(s): VITAMINB12, FOLATE, FERRITIN, TIBC, IRON, RETICCTPCT in the last 72 hours. Urinalysis    Component Value Date/Time   COLORURINE YELLOW 02/29/2016 0903   APPEARANCEUR HAZY (A) 02/29/2016 0903   LABSPEC 1.024 02/29/2016 0903   PHURINE 5.0 02/29/2016 0903   GLUCOSEU 50 (A) 02/29/2016 0903   HGBUR MODERATE (A) 02/29/2016 0903   BILIRUBINUR NEGATIVE 02/29/2016 0903   KETONESUR NEGATIVE 02/29/2016 0903   PROTEINUR 100 (A) 02/29/2016 0903   UROBILINOGEN 0.2 10/02/2013 2108   NITRITE NEGATIVE 02/29/2016 0903   LEUKOCYTESUR LARGE (A) 02/29/2016 0903   Sepsis Labs Invalid input(s): PROCALCITONIN,  WBC,  LACTICIDVEN Microbiology No  results found for this or any previous visit (from the past 240 hour(s)).  Time coordinating discharge: Over 30 minutes  SIGNED:  Kerney Elbe, DO [facility] 03/02/2016, 3:35 PM Pager [phone]  If 7PM-7AM, please contact night-coverage [email or web address] Password [email or web address]

## 2016-03-02 NOTE — Progress Notes (Signed)
Inpatient Diabetes Program Recommendations  AACE/ADA: New Consensus Statement on Inpatient Glycemic Control (2015)  Target Ranges:  Prepandial:   less than 140 mg/dL      Peak postprandial:   less than 180 mg/dL (1-2 hours)      Critically ill patients:  140 - 180 mg/dL   Lab Results  Component Value Date   GLUCAP 138 (H) 03/02/2016   HGBA1C 9.7 (H) 03/01/2016    Review of Glycemic Control Results for Deborah [name], Deborah [name] (MRN [id number]) as of 03/02/2016 08:01  Ref. Range 03/01/2016 07:41 03/01/2016 11:33 03/01/2016 16:58 03/01/2016 22:15 03/02/2016 07:56  Glucose-Capillary Latest Ref Range: 65 - 99 mg/dL 147 (H) 128 (H) 269 (H) 240 (H) 138 (H)   Diabetes history: DM2 Outpatient Diabetes medications: 70/30 pc meals 75 breakfast+60 Lunch+50 units dinner Current orders for Inpatient glycemic control: Lantus 55 units + Novolog correction 0-15 units tid  Inpatient Diabetes Program Recommendations:  Please consider adding Novolog meal coverage 6-8 units tid if eats 50%. Noted A1c was 9.9 12/06/15. Will follow.  Thank you, Nani Gasser. Kerrick Miler, RN, MSN, CDE Inpatient Glycemic Control Team Team Pager [phone] (8am-5pm) 03/02/2016 8:12 AM

## 2016-03-02 NOTE — Evaluation (Signed)
Occupational Therapy Evaluation Patient Details Name: Deborah [name] MRN: [id number] DOB: Apr 27, 1965 Today's Date: 03/02/2016    History of Present Illness Pt admitted through ED with N/V and generalized weakness.  Pt also c/o low back pain and LE pain/weakness exacerbated by ambulation and relieved by forward flexion   Clinical Impression      OT completed education regarding ADL safety with ADL activity and techniques for dressing to decrease back pain.  Pt verbalized understanding. Pt would like a walker for home with a seat.  OT discussed possibility of a tub seat with pt but she is staying at her nephews and does not think it would fit     Follow Up Recommendations  No OT follow up    Equipment Recommendations  Other (comment) (rolling walker ( pt wants one with a seat))       Precautions / Restrictions        Mobility Bed Mobility Overal bed mobility: Modified Independent                Transfers Overall transfer level: Modified independent Equipment used: Rolling walker (2 wheeled) Transfers: Sit to/from Stand Sit to Stand: Modified independent (Device/Increase time)         General transfer comment: Vc for hand placement         ADL Overall ADL's : Modified independent                                       General ADL Comments: Pt able to use the walker and perform simple ADL activity at mod I level.  OT educated pt regarding walker safety, safety with ADL activity and hand placement to make getting up and down easier.  Pt states she is homeless and will be staying at her nephews.               Pertinent Vitals/Pain Pain Assessment: No/denies pain     Hand Dominance     Extremity/Trunk Assessment Upper Extremity Assessment Upper Extremity Assessment: Overall WFL for tasks assessed       Cervical / Trunk Assessment Cervical / Trunk Assessment: Lordotic   Communication Communication Communication: No  difficulties   Cognition Arousal/Alertness: Awake/alert Behavior During Therapy: WFL for tasks assessed/performed Overall Cognitive Status: Within Functional Limits for tasks assessed                                Home Living Family/patient expects to be discharged to:: Private residence Living Arrangements: Spouse/significant other Available Help at Discharge: Family Type of Home: House Home Access: Stairs to enter     Home Layout: One level     Bathroom Shower/Tub: Tub/shower unit Shower/tub characteristics: Door       Home Equipment: Radio producer - single point          Prior Functioning/Environment Level of Independence: Independent with assistive device(s)        Comments: Pt mobilizes ltd distances with cane - states does not feel secure 2* increased back pain and LE weakness with increased time standing/ambulating             OT Goals(Current goals can be found in the care plan section) Acute Rehab OT Goals Patient Stated Goal: get back on my feet  OT Frequency:  End of Session Equipment Utilized During Treatment: Rolling walker  Activity Tolerance: Patient tolerated treatment well Patient left: in chair;with call bell/phone within reach   Time: 1405-1430 OT Time Calculation (min): 25 min Charges:  OT General Charges $OT Visit: 1 Procedure OT Evaluation $OT Eval Moderate Complexity: 1 Procedure OT Treatments $Self Care/Home Management : 8-22 mins G-Codes:    Payton Mccallum D 2016-03-07, 2:54 PM

## 2016-03-02 NOTE — Care Management Note (Signed)
Case Management Note  Patient Details  Name: NYRA ANSPAUGH MRN: [id number] Date of Birth: 1965/04/12  Subjective/Objective:                  Intractable Nausea/Vomitng  Action/Plan: Discharge planning Expected Discharge Date:  03/02/16              Expected Discharge Plan:  Home/Self Care  In-House Referral:     Discharge planning Services  CM Consult, [facility], Medication Assistance  Post Acute Care Choice:    Choice offered to:  Patient  DME Arranged:  N/A DME Agency:  NA  HH Arranged:  NA HH Agency:  NA  Status of Service:  Completed, signed off  If discussed at [place] of Stay Meetings, dates discussed:    Additional Comments: CM met with pt in room to discuss discharge plan.  Pt states she has a place to stay but it is temporary and she is not sure where she will be in the near future.  Pt states she has transportation thru her mother and is able to get to the [facility].  CM has given pt [place] pamphlet and pt will go to the clinic for follow up appointment, to secure a new pcp, AND to meet with an insurance navigator.  Pt verbalized understanding she needs to commit to the appointments and show up; pt agrees to keep her appointments with the clinic. At this time, pt is not receiving any new prescriptions and states her mother can help out with the cost of what she is already taking.  No other CM needs were communicated. Dellie Catholic, RN 03/02/2016, 2:28 PM

## 2016-03-03 LAB — GLUCOSE, CAPILLARY: Glucose-Capillary: 210 mg/dL — ABNORMAL HIGH (ref 65–99)

## 2016-04-20 ENCOUNTER — Emergency Department (HOSPITAL_COMMUNITY)
Admission: EM | Admit: 2016-04-20 | Discharge: 2016-04-20 | Disposition: A | Discharge status: Home / Self Care | Payer: Self-pay | Attending: Emergency Medicine | Admitting: Emergency Medicine

## 2016-04-20 ENCOUNTER — Emergency Department (HOSPITAL_COMMUNITY): Payer: Self-pay

## 2016-04-20 ENCOUNTER — Encounter (HOSPITAL_COMMUNITY): Payer: Self-pay

## 2016-04-20 DIAGNOSIS — N183 Chronic kidney disease, stage 3 (moderate): Secondary | ICD-10-CM | POA: Insufficient documentation

## 2016-04-20 DIAGNOSIS — I129 Hypertensive chronic kidney disease with stage 1 through stage 4 chronic kidney disease, or unspecified chronic kidney disease: Secondary | ICD-10-CM | POA: Insufficient documentation

## 2016-04-20 DIAGNOSIS — R079 Chest pain, unspecified: Secondary | ICD-10-CM

## 2016-04-20 DIAGNOSIS — K3184 Gastroparesis: Secondary | ICD-10-CM

## 2016-04-20 DIAGNOSIS — Z794 Long term (current) use of insulin: Secondary | ICD-10-CM | POA: Insufficient documentation

## 2016-04-20 DIAGNOSIS — E1143 Type 2 diabetes mellitus with diabetic autonomic (poly)neuropathy: Secondary | ICD-10-CM | POA: Insufficient documentation

## 2016-04-20 DIAGNOSIS — R112 Nausea with vomiting, unspecified: Secondary | ICD-10-CM

## 2016-04-20 LAB — BASIC METABOLIC PANEL
Anion gap: 12 (ref 5–15)
BUN: 13 mg/dL (ref 6–20)
CHLORIDE: 99 mmol/L — AB (ref 101–111)
CO2: 22 mmol/L (ref 22–32)
CREATININE: 1.01 mg/dL — AB (ref 0.44–1.00)
Calcium: 9.6 mg/dL (ref 8.9–10.3)
GFR calc Af Amer: >60 mL/min (ref >60)
GFR calc non Af Amer: >60 mL/min (ref >60)
Glucose, Bld: 375 mg/dL — ABNORMAL HIGH (ref 65–99)
Potassium: 4.1 mmol/L (ref 3.5–5.1)
SODIUM: 133 mmol/L — AB (ref 135–145)

## 2016-04-20 LAB — I-STAT TROPONIN, ED
TROPONIN I, POC: 0 ng/mL (ref 0.00–0.08)
Troponin i, poc: 0 ng/mL (ref 0.00–0.08)

## 2016-04-20 LAB — CBC
HCT: 33.9 % — ABNORMAL LOW (ref 36.0–46.0)
Hemoglobin: 11.5 g/dL — ABNORMAL LOW (ref 12.0–15.0)
MCH: 29.1 pg (ref 26.0–34.0)
MCHC: 33.9 g/dL (ref 30.0–36.0)
MCV: 85.8 fL (ref 78.0–100.0)
PLATELETS: 383 10*3/uL (ref 150–400)
RBC: 3.95 MIL/uL (ref 3.87–5.11)
RDW: 13.7 % (ref 11.5–15.5)
WBC: 10.9 10*3/uL — AB (ref 4.0–10.5)

## 2016-04-20 LAB — LIPASE, BLOOD: Lipase: 24 U/L (ref 11–51)

## 2016-04-20 LAB — URINALYSIS, ROUTINE W REFLEX MICROSCOPIC
BILIRUBIN URINE: NEGATIVE
Glucose, UA: >=500 mg/dL — AB
Ketones, ur: 5 mg/dL — AB
Nitrite: NEGATIVE
PROTEIN: 100 mg/dL — AB
Specific Gravity, Urine: 1.017 (ref 1.005–1.030)
pH: 5 (ref 5.0–8.0)

## 2016-04-20 LAB — HEPATIC FUNCTION PANEL
ALK PHOS: 109 U/L (ref 38–126)
ALT: 18 U/L (ref 14–54)
AST: 32 U/L (ref 15–41)
Albumin: 3.7 g/dL (ref 3.5–5.0)
BILIRUBIN DIRECT: 0.3 mg/dL (ref 0.1–0.5)
BILIRUBIN INDIRECT: 0.8 mg/dL (ref 0.3–0.9)
TOTAL PROTEIN: 8.1 g/dL (ref 6.5–8.1)
Total Bilirubin: 1.1 mg/dL (ref 0.3–1.2)

## 2016-04-20 LAB — D-DIMER, QUANTITATIVE: D-Dimer, Quant: 0.76 ug/mL-FEU — ABNORMAL HIGH (ref 0.00–0.50)

## 2016-04-20 LAB — POC URINE PREG, ED: PREG TEST UR: NEGATIVE

## 2016-04-20 MED ORDER — SODIUM CHLORIDE 0.9 % IV BOLUS (SEPSIS)
1000.0000 mL | Freq: Once | INTRAVENOUS | Status: AC
  Administered 2016-04-20: 1000 mL via INTRAVENOUS

## 2016-04-20 MED ORDER — FENTANYL CITRATE (PF) 100 MCG/2ML IJ SOLN
100.0000 ug | Freq: Once | INTRAMUSCULAR | Status: AC
  Administered 2016-04-20: 100 ug via INTRAVENOUS
  Filled 2016-04-20: qty 2

## 2016-04-20 MED ORDER — ONDANSETRON 4 MG PO TBDP: 4.0000 mg | ORAL_TABLET | Freq: Three times a day (TID) | ORAL | 0 refills | Status: DC | PRN

## 2016-04-20 MED ORDER — DICYCLOMINE HCL 20 MG PO TABS: 20.0000 mg | ORAL_TABLET | Freq: Two times a day (BID) | ORAL | 0 refills | Status: DC

## 2016-04-20 MED ORDER — HALOPERIDOL LACTATE 5 MG/ML IJ SOLN
2.0000 mg | Freq: Once | INTRAMUSCULAR | Status: AC
  Administered 2016-04-20: 2 mg via INTRAVENOUS
  Filled 2016-04-20: qty 1

## 2016-04-20 MED ORDER — ONDANSETRON HCL 4 MG/2ML IJ SOLN
4.0000 mg | Freq: Once | INTRAMUSCULAR | Status: AC
  Administered 2016-04-20: 4 mg via INTRAVENOUS
  Filled 2016-04-20: qty 2

## 2016-04-20 MED ORDER — SODIUM CHLORIDE 0.9 % IJ SOLN
INTRAMUSCULAR | Status: AC
  Filled 2016-04-20: qty 50

## 2016-04-20 MED ORDER — ONDANSETRON HCL 4 MG/2ML IJ SOLN
4.0000 mg | Freq: Once | INTRAMUSCULAR | Status: AC | PRN
  Administered 2016-04-20: 4 mg via INTRAVENOUS
  Filled 2016-04-20: qty 2

## 2016-04-20 MED ORDER — IOPAMIDOL (ISOVUE-370) INJECTION 76%
INTRAVENOUS | Status: AC
  Filled 2016-04-20: qty 100

## 2016-04-20 MED ORDER — IOPAMIDOL (ISOVUE-370) INJECTION 76%
100.0000 mL | Freq: Once | INTRAVENOUS | Status: AC | PRN
  Administered 2016-04-20: 100 mL via INTRAVENOUS

## 2016-04-20 MED ORDER — DICYCLOMINE HCL 10 MG PO CAPS
10.0000 mg | ORAL_CAPSULE | Freq: Once | ORAL | Status: AC
  Administered 2016-04-20: 10 mg via ORAL
  Filled 2016-04-20: qty 1

## 2016-04-20 NOTE — ED Notes (Signed)
Patient transported to CT 

## 2016-04-20 NOTE — ED Notes (Signed)
Bed: WA14 Expected date:  Expected time:  Means of arrival:  Comments: Res B 

## 2016-04-20 NOTE — ED Triage Notes (Signed)
Patient c/o mid chest pain that radiartes into the left jaw and the left arm since last night. Patient also c/o vomiting, SOB.

## 2016-04-20 NOTE — ED Notes (Signed)
Patient is vomiting. Standing order for zofran put in.

## 2016-04-20 NOTE — ED Notes (Signed)
Discharge instructions, follow up care, and rx x2 reviewed with patient. Patient verbalized understanding. 

## 2016-04-20 NOTE — ED Notes (Signed)
Patient given water and crackers for PO challenge.

## 2016-04-20 NOTE — ED Provider Notes (Signed)
[facility] DEPT Provider Note   [name]: [id number] Arrival date & time: 04/20/16  1130     History   Chief Complaint Chief Complaint  Patient presents with  . Chest Pain    HPI ARIABELLA BRIEN is a 51 y.o. female.  JAHNYA TRINDADE is a 51 y.o. Female with a history of diabetes, gastroparesis, HTN, CVA and chest pain who presents to the ED complaining of left sided chest pain since last night. Patient reports left chest pain that radiates to her left arm and left neck constant since last night. She is unable to report alleviating or aggravating factors. She reports feeling short of breath. She reports developing nausea and vomiting with generalized abdominal pain starting this morning and has vomited several times. She denies diarrhea. She reports this pain feels sharper than her gastroparesis. She has had previous cholecystectomy. Denies personal or early close family history of MI, PE or DVT. No recent long travel or endogenous estrogen use. She does not smoke. She denies fevers, coughing, hemoptysis, numbness, tingling, weakness, urinary symptoms, vaginal bleeding, vaginal discharge, leg swelling, syncope or rashes.    The history is provided by the patient, medical records and a parent. No language interpreter was used.  Chest Pain   Associated symptoms include abdominal pain, nausea, shortness of breath and vomiting. Pertinent negatives include no back pain, no cough, no fever, no headaches, no numbness, no palpitations and no weakness.    Past Medical History:  Diagnosis Date  . Chest pain 12/2015  . Diabetes mellitus   . Fibromyalgia   . Gastric ulcer   . Gastroparesis   . Hypertension   . Obesity   . Pneumonia   . Stroke [facility]) 02/2011    Patient Active Problem List   Diagnosis Date Noted  . Dehydration 02/28/2016  . AKI (acute kidney injury) ([place]) 12/06/2015  . Chest pain 09/26/2015  . Hypokalemia 09/26/2015  . DM (diabetes mellitus), type 2,  uncontrolled ([place]) 09/26/2015  . Hypomagnesemia 09/26/2015  . Gastroparesis 09/26/2015  . Gastroparesis 09/21/2015  . Hypokalemia 08/20/2015  . Hypomagnesemia 08/20/2015  . Nausea and vomiting 08/20/2015  . UTI (urinary tract infection) 05/27/2015  . Gout flare 05/27/2015  . Leukocytosis 05/26/2015  . Nausea with vomiting 05/26/2015  . Abdominal pain 05/26/2015  . [name] 05/26/2015  . DKA (diabetic ketoacidoses) ([place]) 05/25/2015  . Uncontrolled type 2 diabetes mellitus with diabetic neuropathy, with long-term current use of insulin ([place]) 05/25/2015  . Dyslipidemia associated with type 2 diabetes mellitus ([place]) 05/25/2015  . CKD (chronic kidney disease) stage 3, GFR 30-59 ml/min 05/25/2015  . Essential hypertension, benign 09/28/2013    Past Surgical History:  Procedure Laterality Date  . CATARACT EXTRACTION  01/2014  . CHOLECYSTECTOMY      OB History    No data available       Home Medications    Prior to Admission medications   Medication Sig Start Date End Date Taking? Authorizing Provider  albuterol (PROVENTIL HFA;VENTOLIN HFA) 108 (90 Base) MCG/ACT inhaler Inhale 2 puffs into the lungs every 6 (six) hours as needed for wheezing or shortness of breath. 09/23/15  Yes Debbe Odea, MD  allopurinol (ZYLOPRIM) 100 MG tablet Take 1 tablet (100 mg total) by mouth daily. 09/23/15  Yes Debbe Odea, MD  amLODipine (NORVASC) 10 MG tablet Take 1 tablet (10 mg total) by mouth daily. 09/23/15  Yes Debbe Odea, MD  colchicine 0.6 MG tablet Take 0.6 mg by mouth daily as needed.  01/31/16  Yes Historical Provider, MD  DULoxetine (CYMBALTA) 30 MG capsule Take 1 capsule (30 mg total) by mouth 2 (two) times daily. 09/23/15  Yes Debbe Odea, MD  metoCLOPramide (REGLAN) 10 MG tablet Take 1 tablet (10 mg total) by mouth every 6 (six) hours. 01/14/16  Yes Gareth Morgan, MD  metoprolol succinate (TOPROL-XL) 25 MG 24 hr tablet Take 1 tablet (25 mg total) by mouth daily. 09/23/15  Yes  Debbe Odea, MD  nitroGLYCERIN (NITROSTAT) 0.4 MG SL tablet Place 1 tablet (0.4 mg total) under the tongue every 5 (five) minutes as needed for chest pain. 12/20/15  Yes Mauricio Gerome Apley, MD  NOVOLOG MIX 70/30 (70-30) 100 UNIT/ML injection Inject 0.5-0.75 mLs (50-75 Units total) into the skin 3 (three) times daily after meals. 75 units every morning, 60 units with lunch, and 50 units with dinner 09/23/15  Yes Debbe Odea, MD  pantoprazole (PROTONIX) 40 MG tablet Take 1 tablet (40 mg total) by mouth 2 (two) times daily. 09/23/15  Yes Debbe Odea, MD  pravastatin (PRAVACHOL) 10 MG tablet Take 1 tablet (10 mg total) by mouth daily. 09/23/15  Yes Debbe Odea, MD  dicyclomine (BENTYL) 20 MG tablet Take 1 tablet (20 mg total) by mouth 2 (two) times daily. 04/20/16   Waynetta Pean, PA-C  folic acid (FOLVITE) 1 MG tablet Take 1 tablet (1 mg total) by mouth daily. Patient not taking: Reported on 02/28/2016 12/10/15   Mir Marry Guan, MD  ondansetron (ZOFRAN ODT) 4 MG disintegrating tablet Take 1 tablet (4 mg total) by mouth every 8 (eight) hours as needed for nausea or vomiting. 04/20/16   Waynetta Pean, PA-C  oxyCODONE (OXY IR/ROXICODONE) 5 MG immediate release tablet Take 1 tablet (5 mg total) by mouth every 8 (eight) hours as needed for moderate pain or severe pain. Patient not taking: Reported on 04/20/2016 12/20/15   Tawni Millers, MD  senna (SENOKOT) 8.6 MG tablet Take 1 tablet (8.6 mg total) by mouth 2 (two) times daily. Patient not taking: Reported on 04/20/2016 09/23/15 09/22/16  Debbe Odea, MD  Vitamin D, Ergocalciferol, (DRISDOL) 50000 units CAPS capsule Take 1 capsule (50,000 Units total) by mouth every 7 (seven) days. Patient not taking: Reported on 04/20/2016 09/23/15   Debbe Odea, MD    Family History Family History  Problem Relation Age of Onset  . Diabetes Mother   . Diabetes Father   . Heart disease Father   . Diabetes Sister   . Congestive Heart Failure Sister  71  . Diabetes Brother     Social History Social History  Substance Use Topics  . Smoking status: Never Smoker  . Smokeless tobacco: Never Used  . Alcohol use No     Allergies   Lisinopril; Penicillins; Valium [diazepam]; Aspirin; Food; Nsaids; Tylenol [acetaminophen]; Morphine and related; and Tramadol   Review of Systems Review of Systems  Constitutional: Negative for chills and fever.  HENT: Negative for congestion and sore throat.   Eyes: Negative for visual disturbance.  Respiratory: Positive for shortness of breath. Negative for cough and wheezing.   Cardiovascular: Positive for chest pain. Negative for palpitations and leg swelling.  Gastrointestinal: Positive for abdominal pain, nausea and vomiting. Negative for blood in stool and diarrhea.  Genitourinary: Negative for difficulty urinating, dysuria, frequency, hematuria, urgency, vaginal bleeding and vaginal discharge.  Musculoskeletal: Negative for back pain and neck pain.  Skin: Negative for rash.  Neurological: Negative for syncope, weakness, numbness and headaches.     Physical Exam Updated  Vital Signs BP 122/78 (BP Location: Left Arm)   Pulse 108   Temp 98.4 F (36.9 C)   Resp 20   Ht 5\' 6"  (1.676 m)   Wt (!) 140.6 kg   LMP 10/10/2012   SpO2 97%   BMI 50.04 kg/m   Physical Exam  Constitutional: She appears well-developed and well-nourished. No distress.  Nontoxic-appearing. Obese female.  HENT:  Head: Normocephalic and atraumatic.  Mouth/Throat: Oropharynx is clear and moist.  Eyes: Conjunctivae are normal. Pupils are equal, round, and reactive to light. Right eye exhibits no discharge. Left eye exhibits no discharge.  Neck: Neck supple. No JVD present. No tracheal deviation present.  Cardiovascular: Regular rhythm, normal heart sounds and intact distal pulses.  Exam reveals no gallop and no friction rub.   No murmur heard. Heart rate is 112. Bilateral radial, and dorsalis pedis pulses are intact.     Pulmonary/Chest: Effort normal and breath sounds normal. No stridor. No respiratory distress. She has no wheezes. She has no rales. She exhibits tenderness.  Lungs are clear to auscultation bilaterally. Substernal chest wall is tender to palpation and reproduces her chest pain.  Abdominal: Soft. Bowel sounds are normal. She exhibits no distension and no mass. There is tenderness. There is no guarding.  Abdomen is soft. Bowel sounds are present. Patient has generalized abdominal tenderness to palpation without focal tenderness. No peritoneal signs.  Musculoskeletal: She exhibits no edema or tenderness.  No lower extremity edema or tenderness.  Lymphadenopathy:    She has no cervical adenopathy.  Neurological: She is alert. Coordination normal.  Skin: Skin is warm and dry. Capillary refill takes less than 2 seconds. No rash noted. She is not diaphoretic. No erythema. No pallor.  Psychiatric: She has a normal mood and affect. Her behavior is normal.  Nursing note and vitals reviewed.    ED Treatments / Results  Labs (all labs ordered are listed, but only abnormal results are displayed) Labs Reviewed  BASIC METABOLIC PANEL - Abnormal; Notable for the following:       Result Value   Sodium 133 (*)    Chloride 99 (*)    Glucose, Bld 375 (*)    Creatinine, Ser 1.01 (*)    All other components within normal limits  CBC - Abnormal; Notable for the following:    WBC 10.9 (*)    Hemoglobin 11.5 (*)    HCT 33.9 (*)    All other components within normal limits  D-DIMER, QUANTITATIVE (NOT AT [facility]) - Abnormal; Notable for the following:    D-Dimer, Quant 0.76 (*)    All other components within normal limits  URINALYSIS, ROUTINE W REFLEX MICROSCOPIC - Abnormal; Notable for the following:    APPearance CLOUDY (*)    Glucose, UA >=500 (*)    Hgb urine dipstick MODERATE (*)    Ketones, ur 5 (*)    Protein, ur 100 (*)    Leukocytes, UA MODERATE (*)    Bacteria, UA RARE (*)    Squamous  Epithelial / LPF 6-30 (*)    All other components within normal limits  LIPASE, BLOOD  HEPATIC FUNCTION PANEL  I-STAT TROPOININ, ED  POC URINE PREG, ED  I-STAT TROPOININ, ED    EKG  EKG Interpretation  Date/Time:  Tuesday April 20 2016 11:45:43 EST Ventricular Rate:  121 PR Interval:    QRS Duration: 69 QT Interval:  321 QTC Calculation: 456 R Axis:   -31 Text Interpretation:  Sinus tachycardia Left axis deviation  No acute changes No significant change since last tracing Confirmed by Kathrynn Humble, MD, Thelma Comp [phone]) on 04/20/2016 12:21:53 PM Also confirmed by Kathrynn Humble, MD, Thelma Comp [phone]), editor [place] CT, Leda Gauze [phone])  on 04/20/2016 12:25:23 PM       Radiology Dg Chest 2 View  Result Date: 04/20/2016 CLINICAL DATA:  Seven onset of mid and left chest pain last night radiating into the left arm and jaw all associated with shortness of breath. History of diabetes, dyslipidemia, nonsmoker. EXAM: CHEST  2 VIEW COMPARISON:  PA and lateral chest X ray dated February 28, 2016 FINDINGS: The lungs are adequately inflated. There is no focal infiltrate. There is no pleural effusion or pneumothorax. The heart and pulmonary vascularity are normal. The mediastinum is normal in width. The trachea is midline. The bony thorax exhibits no acute abnormality. IMPRESSION: There is no pneumonia nor other acute cardiopulmonary abnormality. Electronically Signed   By: David  Martinique M.D.   On: 04/20/2016 12:04   Ct Angio Chest Pe W/cm &/or Wo Cm  Result Date: 04/20/2016 CLINICAL DATA:  Chest pain.  [name]. EXAM: CT ANGIOGRAPHY CHEST WITH CONTRAST TECHNIQUE: Multidetector CT imaging of the chest was performed using the standard protocol during bolus administration of intravenous contrast. Multiplanar CT image reconstructions and MIPs were obtained to evaluate the vascular anatomy. CONTRAST:  100 mL of Isovue 370 intravenously. COMPARISON:  Radiographs of same day.  CT scan of January 14, 2016.  FINDINGS: Cardiovascular: Satisfactory opacification of the pulmonary arteries to the segmental level. No evidence of pulmonary embolism. Normal heart size. No pericardial effusion. Mediastinum/Nodes: No enlarged mediastinal, hilar, or axillary lymph nodes. Thyroid gland, trachea, and esophagus demonstrate no significant findings. Lungs/Pleura: Lungs are clear. No pleural effusion or pneumothorax. Upper Abdomen: Fatty infiltration of the liver is noted. Musculoskeletal: No chest wall abnormality. No acute or significant osseous findings. Review of the MIP images confirms the above findings. IMPRESSION: No evidence of pulmonary embolus. Fatty infiltration of the liver. No other abnormality seen in the chest. Electronically Signed   By: Marijo Conception, M.D.   On: 04/20/2016 15:27    Procedures Procedures (including critical care time)  Medications Ordered in ED Medications  sodium chloride 0.9 % injection (not administered)  iopamidol (ISOVUE-370) 76 % injection (not administered)  ondansetron (ZOFRAN) injection 4 mg (4 mg Intravenous Given 04/20/16 1237)  sodium chloride 0.9 % bolus 1,000 mL (0 mLs Intravenous Stopped 04/20/16 1643)  ondansetron (ZOFRAN) injection 4 mg (4 mg Intravenous Given 04/20/16 1346)  fentaNYL (SUBLIMAZE) injection 100 mcg (100 mcg Intravenous Given 04/20/16 1346)  iopamidol (ISOVUE-370) 76 % injection 100 mL (100 mLs Intravenous Contrast Given 04/20/16 1500)  sodium chloride 0.9 % bolus 1,000 mL (0 mLs Intravenous Stopped 04/20/16 1901)  dicyclomine (BENTYL) capsule 10 mg (10 mg Oral Given 04/20/16 1648)  haloperidol lactate (HALDOL) injection 2 mg (2 mg Intravenous Given 04/20/16 1736)     Initial Impression / Assessment and Plan / ED Course  I have reviewed the triage vital signs and the nursing notes.  Pertinent labs & imaging results that were available during my care of the patient were reviewed by me and considered in my medical decision making (see chart for  details).    This is a 51 y.o. Female with a history of diabetes, gastroparesis, HTN, CVA and chest pain who presents to the ED complaining of left sided chest pain since last night. Patient reports left chest pain that radiates to her left arm and left neck constant  since last night. She is unable to report alleviating or aggravating factors. She reports feeling short of breath. She reports developing nausea and vomiting with generalized abdominal pain starting this morning and has vomited several times. She denies diarrhea. She reports this pain feels sharper than her gastroparesis. She has had previous cholecystectomy.  On exam patient is afebrile nontoxic appearing. Initial heart rate is around 122. On my exam patient's heart rate is around 112. She is not hypoxic on my exam. Her lungs are clear to auscultation bilaterally. Her abdomen is soft and she has generalized abdominal tenderness to palpation. No focal tenderness. No lower extremity edema or tenderness. EKG shows no significant change from her last tracing. Initial troponin is 0. Pregnancy test is negative. Urinalysis is nitrite negative with moderate leukocytes per patient denies any urinary symptoms. Hepatic function panel is within normal limits. Lipase is within normal limits. BMP is remarkable for a glucose of 375 with a normal anion gap. CBC is around patient's baseline. White count of 10,900. Patient is tachycardic on arrival with chest pain and shortness of breath. The d-dimer was obtained. I have low suspicion for PE. D-dimer returned elevated. CT angiogram was obtained which showed no evidence of PE. At reevaluation after treatments in the emergency department patient reports her chest pain and shortness of breath has resolved. She reports she still having some mild upper abdominal pain. She reports this feels just like her gastroparesis. She believes this was causing her symptoms today. She tolerated crackers and water but reports he is  feeling nauseated like she might vomit again. Patient then tolerated Bentyl and Haldol and reports this improved her pain and symptoms. She is able to tolerate by mouth without nausea or vomiting. HR is 106 while I am in the room. This is much improved from 122. Delta troponin was also obtained, and this was 0.00. Low suspicion for ACS. Will have her follow-up with cardiology due to this chest pain. We'll also have her follow-up with her primary care doctor and GI specialist. Bentyl and Zofran at discharge. I discussed return precautions.  This patient was discussed with Dr. Kathrynn Humble who agrees with assessment and plan.   Final Clinical Impressions(s) / ED Diagnoses   Final diagnoses:  Non-intractable vomiting with nausea, unspecified vomiting type  Gastroparesis  Nonspecific chest pain    New Prescriptions New Prescriptions   DICYCLOMINE (BENTYL) 20 MG TABLET    Take 1 tablet (20 mg total) by mouth 2 (two) times daily.   ONDANSETRON (ZOFRAN ODT) 4 MG DISINTEGRATING TABLET    Take 1 tablet (4 mg total) by mouth every 8 (eight) hours as needed for nausea or vomiting.     Waynetta Pean, PA-C 04/20/16 1906    Varney Biles, MD 04/21/16 1550

## 2016-04-23 ENCOUNTER — Encounter (HOSPITAL_COMMUNITY): Payer: Self-pay

## 2016-04-23 ENCOUNTER — Emergency Department (HOSPITAL_COMMUNITY): Payer: Self-pay

## 2016-04-23 ENCOUNTER — Inpatient Hospital Stay (HOSPITAL_COMMUNITY)
Admission: EM | Admit: 2016-04-23 | Discharge: 2016-04-29 | DRG: 074 | Disposition: A | Discharge status: Home / Self Care | Payer: Self-pay | Attending: Family Medicine | Admitting: Family Medicine

## 2016-04-23 DIAGNOSIS — M797 Fibromyalgia: Secondary | ICD-10-CM | POA: Diagnosis present

## 2016-04-23 DIAGNOSIS — N1832 Chronic kidney disease, stage 3b: Secondary | ICD-10-CM | POA: Diagnosis present

## 2016-04-23 DIAGNOSIS — Z8711 Personal history of peptic ulcer disease: Secondary | ICD-10-CM

## 2016-04-23 DIAGNOSIS — Z794 Long term (current) use of insulin: Secondary | ICD-10-CM

## 2016-04-23 DIAGNOSIS — E1143 Type 2 diabetes mellitus with diabetic autonomic (poly)neuropathy: Principal | ICD-10-CM | POA: Diagnosis present

## 2016-04-23 DIAGNOSIS — K3184 Gastroparesis: Secondary | ICD-10-CM | POA: Diagnosis present

## 2016-04-23 DIAGNOSIS — E1140 Type 2 diabetes mellitus with diabetic neuropathy, unspecified: Secondary | ICD-10-CM

## 2016-04-23 DIAGNOSIS — IMO0002 Reserved for concepts with insufficient information to code with codable children: Secondary | ICD-10-CM

## 2016-04-23 DIAGNOSIS — E876 Hypokalemia: Secondary | ICD-10-CM | POA: Diagnosis not present

## 2016-04-23 DIAGNOSIS — Z79899 Other long term (current) drug therapy: Secondary | ICD-10-CM

## 2016-04-23 DIAGNOSIS — E1122 Type 2 diabetes mellitus with diabetic chronic kidney disease: Secondary | ICD-10-CM | POA: Diagnosis present

## 2016-04-23 DIAGNOSIS — N184 Chronic kidney disease, stage 4 (severe): Secondary | ICD-10-CM | POA: Diagnosis present

## 2016-04-23 DIAGNOSIS — Z8673 Personal history of transient ischemic attack (TIA), and cerebral infarction without residual deficits: Secondary | ICD-10-CM

## 2016-04-23 DIAGNOSIS — M109 Gout, unspecified: Secondary | ICD-10-CM | POA: Diagnosis present

## 2016-04-23 DIAGNOSIS — E1165 Type 2 diabetes mellitus with hyperglycemia: Secondary | ICD-10-CM | POA: Diagnosis present

## 2016-04-23 DIAGNOSIS — R079 Chest pain, unspecified: Secondary | ICD-10-CM

## 2016-04-23 DIAGNOSIS — I10 Essential (primary) hypertension: Secondary | ICD-10-CM | POA: Diagnosis present

## 2016-04-23 DIAGNOSIS — N182 Chronic kidney disease, stage 2 (mild): Secondary | ICD-10-CM | POA: Diagnosis present

## 2016-04-23 DIAGNOSIS — R112 Nausea with vomiting, unspecified: Secondary | ICD-10-CM | POA: Diagnosis present

## 2016-04-23 DIAGNOSIS — R109 Unspecified abdominal pain: Secondary | ICD-10-CM

## 2016-04-23 DIAGNOSIS — D649 Anemia, unspecified: Secondary | ICD-10-CM | POA: Diagnosis present

## 2016-04-23 DIAGNOSIS — D72829 Elevated white blood cell count, unspecified: Secondary | ICD-10-CM | POA: Diagnosis present

## 2016-04-23 DIAGNOSIS — E785 Hyperlipidemia, unspecified: Secondary | ICD-10-CM | POA: Diagnosis present

## 2016-04-23 DIAGNOSIS — N183 Chronic kidney disease, stage 3 (moderate): Secondary | ICD-10-CM | POA: Diagnosis present

## 2016-04-23 DIAGNOSIS — I129 Hypertensive chronic kidney disease with stage 1 through stage 4 chronic kidney disease, or unspecified chronic kidney disease: Secondary | ICD-10-CM | POA: Diagnosis present

## 2016-04-23 DIAGNOSIS — R739 Hyperglycemia, unspecified: Secondary | ICD-10-CM

## 2016-04-23 DIAGNOSIS — N179 Acute kidney failure, unspecified: Secondary | ICD-10-CM | POA: Diagnosis present

## 2016-04-23 DIAGNOSIS — E860 Dehydration: Secondary | ICD-10-CM | POA: Diagnosis present

## 2016-04-23 LAB — BASIC METABOLIC PANEL
ANION GAP: 10 (ref 5–15)
BUN: 14 mg/dL (ref 6–20)
CHLORIDE: 101 mmol/L (ref 101–111)
CO2: 24 mmol/L (ref 22–32)
Calcium: 9.3 mg/dL (ref 8.9–10.3)
Creatinine, Ser: 1.35 mg/dL — ABNORMAL HIGH (ref 0.44–1.00)
GFR calc Af Amer: 51 mL/min — ABNORMAL LOW (ref >60)
GFR calc non Af Amer: 44 mL/min — ABNORMAL LOW (ref >60)
GLUCOSE: 446 mg/dL — AB (ref 65–99)
POTASSIUM: 3.7 mmol/L (ref 3.5–5.1)
SODIUM: 135 mmol/L (ref 135–145)

## 2016-04-23 LAB — CBC
HEMATOCRIT: 35.2 % — AB (ref 36.0–46.0)
HEMOGLOBIN: 11.7 g/dL — AB (ref 12.0–15.0)
MCH: 28.7 pg (ref 26.0–34.0)
MCHC: 33.2 g/dL (ref 30.0–36.0)
MCV: 86.3 fL (ref 78.0–100.0)
Platelets: 374 10*3/uL (ref 150–400)
RBC: 4.08 MIL/uL (ref 3.87–5.11)
RDW: 13.7 % (ref 11.5–15.5)
WBC: 12.3 10*3/uL — AB (ref 4.0–10.5)

## 2016-04-23 LAB — CBG MONITORING, ED: Glucose-Capillary: 376 mg/dL — ABNORMAL HIGH (ref 65–99)

## 2016-04-23 LAB — I-STAT TROPONIN, ED: Troponin i, poc: 0 ng/mL (ref 0.00–0.08)

## 2016-04-23 LAB — GLUCOSE, CAPILLARY: Glucose-Capillary: 360 mg/dL — ABNORMAL HIGH (ref 65–99)

## 2016-04-23 MED ORDER — ENOXAPARIN SODIUM 60 MG/0.6ML ~~LOC~~ SOLN
60.0000 mg | Freq: Every day | SUBCUTANEOUS | Status: DC
  Administered 2016-04-23 – 2016-04-28 (×6): 60 mg via SUBCUTANEOUS
  Filled 2016-04-23 (×6): qty 0.6

## 2016-04-23 MED ORDER — INSULIN ASPART 100 UNIT/ML ~~LOC~~ SOLN
0.0000 [IU] | Freq: Every day | SUBCUTANEOUS | Status: DC
  Administered 2016-04-23: 5 [IU] via SUBCUTANEOUS
  Administered 2016-04-27: 3 [IU] via SUBCUTANEOUS
  Administered 2016-04-28: 2 [IU] via SUBCUTANEOUS

## 2016-04-23 MED ORDER — METOCLOPRAMIDE HCL 5 MG/ML IJ SOLN
10.0000 mg | Freq: Three times a day (TID) | INTRAMUSCULAR | Status: DC
  Administered 2016-04-24 – 2016-04-28 (×14): 10 mg via INTRAVENOUS
  Filled 2016-04-23 (×14): qty 2

## 2016-04-23 MED ORDER — FENTANYL CITRATE (PF) 100 MCG/2ML IJ SOLN
50.0000 ug | Freq: Once | INTRAMUSCULAR | Status: AC
  Administered 2016-04-23: 50 ug via INTRAVENOUS
  Filled 2016-04-23: qty 2

## 2016-04-23 MED ORDER — HYDROMORPHONE HCL 1 MG/ML IJ SOLN
1.0000 mg | Freq: Once | INTRAMUSCULAR | Status: AC
  Administered 2016-04-23: 1 mg via INTRAVENOUS
  Filled 2016-04-23: qty 1

## 2016-04-23 MED ORDER — METOCLOPRAMIDE HCL 5 MG/ML IJ SOLN
10.0000 mg | Freq: Once | INTRAMUSCULAR | Status: AC
  Administered 2016-04-23: 10 mg via INTRAVENOUS
  Filled 2016-04-23: qty 2

## 2016-04-23 MED ORDER — SODIUM CHLORIDE 0.9 % IV SOLN
INTRAVENOUS | Status: DC
  Administered 2016-04-23 – 2016-04-28 (×7): via INTRAVENOUS

## 2016-04-23 MED ORDER — SUCRALFATE 1 G PO TABS
1.0000 g | ORAL_TABLET | Freq: Once | ORAL | Status: AC
Start: 2016-04-23 — End: 2016-04-23
  Administered 2016-04-23: 1 g via ORAL
  Filled 2016-04-23: qty 1

## 2016-04-23 MED ORDER — ONDANSETRON HCL 4 MG/2ML IJ SOLN
4.0000 mg | Freq: Four times a day (QID) | INTRAMUSCULAR | Status: DC | PRN
  Administered 2016-04-25: 4 mg via INTRAVENOUS
  Filled 2016-04-23: qty 2

## 2016-04-23 MED ORDER — ONDANSETRON HCL 4 MG PO TABS
4.0000 mg | ORAL_TABLET | Freq: Four times a day (QID) | ORAL | Status: DC | PRN
  Administered 2016-04-27: 4 mg via ORAL
  Filled 2016-04-23: qty 1

## 2016-04-23 MED ORDER — HALOPERIDOL LACTATE 5 MG/ML IJ SOLN
5.0000 mg | Freq: Once | INTRAMUSCULAR | Status: AC
  Administered 2016-04-23: 5 mg via INTRAVENOUS
  Filled 2016-04-23: qty 1

## 2016-04-23 MED ORDER — INSULIN ASPART 100 UNIT/ML ~~LOC~~ SOLN
6.0000 [IU] | Freq: Once | SUBCUTANEOUS | Status: AC
  Administered 2016-04-23: 6 [IU] via INTRAVENOUS
  Filled 2016-04-23: qty 1

## 2016-04-23 MED ORDER — INSULIN ASPART 100 UNIT/ML ~~LOC~~ SOLN
0.0000 [IU] | Freq: Three times a day (TID) | SUBCUTANEOUS | Status: DC
  Administered 2016-04-24: 7 [IU] via SUBCUTANEOUS
  Administered 2016-04-24: 4 [IU] via SUBCUTANEOUS
  Administered 2016-04-24: 7 [IU] via SUBCUTANEOUS
  Administered 2016-04-25 (×2): 11 [IU] via SUBCUTANEOUS
  Administered 2016-04-25: 3 [IU] via SUBCUTANEOUS
  Administered 2016-04-26: 11 [IU] via SUBCUTANEOUS
  Administered 2016-04-26 (×2): 4 [IU] via SUBCUTANEOUS
  Administered 2016-04-27: 7 [IU] via SUBCUTANEOUS
  Administered 2016-04-27 (×2): 4 [IU] via SUBCUTANEOUS
  Administered 2016-04-28: 7 [IU] via SUBCUTANEOUS
  Administered 2016-04-28 (×2): 4 [IU] via SUBCUTANEOUS
  Administered 2016-04-29: 15 [IU] via SUBCUTANEOUS
  Administered 2016-04-29: 3 [IU] via SUBCUTANEOUS

## 2016-04-23 MED ORDER — ONDANSETRON HCL 4 MG/2ML IJ SOLN
4.0000 mg | Freq: Once | INTRAMUSCULAR | Status: AC | PRN
  Administered 2016-04-23: 4 mg via INTRAVENOUS
  Filled 2016-04-23: qty 2

## 2016-04-23 MED ORDER — SODIUM CHLORIDE 0.9 % IV BOLUS (SEPSIS)
1000.0000 mL | Freq: Once | INTRAVENOUS | Status: AC
  Administered 2016-04-23: 1000 mL via INTRAVENOUS

## 2016-04-23 MED ORDER — DIPHENHYDRAMINE HCL 50 MG PO CAPS
50.0000 mg | ORAL_CAPSULE | Freq: Every evening | ORAL | Status: DC | PRN
  Administered 2016-04-24: 50 mg via ORAL
  Filled 2016-04-23: qty 1

## 2016-04-23 MED ORDER — AMLODIPINE BESYLATE 10 MG PO TABS
10.0000 mg | ORAL_TABLET | Freq: Every day | ORAL | Status: DC
  Administered 2016-04-24 – 2016-04-29 (×6): 10 mg via ORAL
  Filled 2016-04-23 (×6): qty 1

## 2016-04-23 MED ORDER — FAMOTIDINE IN NACL 20-0.9 MG/50ML-% IV SOLN
20.0000 mg | Freq: Two times a day (BID) | INTRAVENOUS | Status: DC
  Administered 2016-04-23 – 2016-04-28 (×10): 20 mg via INTRAVENOUS
  Filled 2016-04-23 (×11): qty 50

## 2016-04-23 MED ORDER — INSULIN GLARGINE 100 UNIT/ML ~~LOC~~ SOLN
40.0000 [IU] | Freq: Every day | SUBCUTANEOUS | Status: DC
  Administered 2016-04-23 – 2016-04-28 (×6): 40 [IU] via SUBCUTANEOUS
  Filled 2016-04-23 (×7): qty 0.4

## 2016-04-23 NOTE — ED Triage Notes (Signed)
Patient c/o mid chest pain, abdominal pain, and vomiting x 3-4 days. Patient states she was seen in the ED for the same 3 days ago.

## 2016-04-23 NOTE — H&P (Signed)
History and Physical  Patient Name: Deborah [name]     ZOX:[id number]    DOB: 06-04-1965    DOA: 04/23/2016 PCP: Nolon Bussing, [place]   Patient coming from: Home  Chief Complaint: Vomiting, abdominal pain, chest pain  HPI: Deborah [name] is a 51 y.o. female with a past medical history significant for IDDM and gastroparesis, HTN, CKD III, hx of CVA, hx of PUD no GIB, and gout who presents with vomitingfor 5 days.  The patient was in her usual state of health until about 5 or 6 days ago when she developed heartburn, diffuse abdominal cramps, and recurrent vomiting with any oral intake-all similar to her previous episodes of gastroparesis. She was seen 3 days ago here, got IV fluids, Zofran, Haldol, cycling, and fentanyl 100 g, and felt improved and was able to go home. However, within a day, she was as bad as she had been before, with constant crampy abdominal discomfort, heartburn that is severe, vomiting with even water crackers, unable to take any of her blood pressure medicines, Reglan, or dicyclomine, and afraid to take her insulin.  Today she began to feel weaker and weaker, so she returned to the ER.  ED course: -Afebrile, heart rate 127, respirations pulse ox normal, blood pressure 143/99 -Na 135, K 3.7, Cr 1.35 (baseline 1.0), WBC 12.3K, Hgb 11.7 -Troponin negative -Chest x-ray clear -ECG showed sinus tachycardia -She was given fentanyl and Dilaudid -She was given Haldol, Reglan, Zofran, and sucralfate -She was given IV fluids and insulin -She continued to feel nauseated and vomited small amounts of nonbloody emesis and so [facility] were asked to admit for IV fluids     She reports taking all of her prescribed medicines, including Reglan and dicyclomine regularly. She has had a cholecystectomy in the past, never a small bowel obstruction. She had a peptic ulcer in the past number of GI bleed. She's had no recent symptoms of melena or hematochezia. She is somewhat confused  between metoclopramide and metoprolol, but claims that she takes both on daily basis.     ROS: Review of Systems  Constitutional: Positive for malaise/fatigue. Negative for chills and fever.  Respiratory: Negative for shortness of breath.   Cardiovascular: Positive for chest pain.  Gastrointestinal: Positive for abdominal pain, heartburn, nausea and vomiting. Negative for blood in stool, constipation, diarrhea and melena.  Neurological: Positive for weakness.  All other systems reviewed and are negative.         Past Medical History:  Diagnosis Date  . Chest pain 12/2015  . Diabetes mellitus   . Fibromyalgia   . Gastric ulcer   . Gastroparesis   . Hypertension   . Obesity   . Pneumonia   . Stroke [facility]) 02/2011    Past Surgical History:  Procedure Laterality Date  . CATARACT EXTRACTION  01/2014  . CHOLECYSTECTOMY      Social History: Patient lives with her husband.  The patient walks unassisted. She used to be a Secondary school teacher.  She is now trying to get Disability. She does not smoke. She is from [place].    Allergies  Allergen Reactions  . Lisinopril Anaphylaxis    Tongue and mouth swelling  . Penicillins Palpitations    Has patient had a PCN reaction causing immediate rash, facial/tongue/throat swelling, SOB or lightheadedness with hypotension: Yes, heart races Has patient had a PCN reaction causing severe rash involving mucus membranes or skin necrosis: No Has patient had a PCN reaction that required hospitalization: Yes  Has patient had a PCN reaction occurring within the last 10 years: No   . Valium [Diazepam] [name]  . Aspirin Other (See Comments)    Irritates stomach ulcer   . Food Hives and Swelling    Carrots, ketchup   . Nsaids Other (See Comments)    ULCER  . Tylenol [Acetaminophen] Other (See Comments)    Irritates stomach ulcer   . Morphine And Related Palpitations  . Tramadol Nausea And Vomiting    Family history: family  history includes Congestive Heart Failure (age of onset: 36) in her sister; Diabetes in her brother, father, mother, and sister; Heart disease in her father.  Prior to Admission medications   Medication Sig Start Date End Date Taking? Authorizing Provider  albuterol (PROVENTIL HFA;VENTOLIN HFA) 108 (90 Base) MCG/ACT inhaler Inhale 2 puffs into the lungs every 6 (six) hours as needed for wheezing or shortness of breath. 09/23/15  Yes Debbe Odea, MD  allopurinol (ZYLOPRIM) 100 MG tablet Take 1 tablet (100 mg total) by mouth daily. 09/23/15  Yes Debbe Odea, MD  amLODipine (NORVASC) 10 MG tablet Take 1 tablet (10 mg total) by mouth daily. 09/23/15  Yes Debbe Odea, MD  colchicine 0.6 MG tablet Take 0.6 mg by mouth daily as needed.  01/31/16  Yes Historical Provider, MD  dicyclomine (BENTYL) 20 MG tablet Take 1 tablet (20 mg total) by mouth 2 (two) times daily. 04/20/16  Yes Waynetta Pean, PA-C  DULoxetine (CYMBALTA) 30 MG capsule Take 1 capsule (30 mg total) by mouth 2 (two) times daily. 09/23/15  Yes Debbe Odea, MD  metoCLOPramide (REGLAN) 10 MG tablet Take 1 tablet (10 mg total) by mouth every 6 (six) hours. 01/14/16  Yes Gareth Morgan, MD  metoprolol succinate (TOPROL-XL) 25 MG 24 hr tablet Take 1 tablet (25 mg total) by mouth daily. 09/23/15  Yes Debbe Odea, MD  nitroGLYCERIN (NITROSTAT) 0.4 MG SL tablet Place 1 tablet (0.4 mg total) under the tongue every 5 (five) minutes as needed for chest pain. 12/20/15  Yes Mauricio Gerome Apley, MD  NOVOLOG MIX 70/30 (70-30) 100 UNIT/ML injection Inject 0.5-0.75 mLs (50-75 Units total) into the skin 3 (three) times daily after meals. 75 units every morning, 60 units with lunch, and 50 units with dinner 09/23/15  Yes Debbe Odea, MD  pantoprazole (PROTONIX) 40 MG tablet Take 1 tablet (40 mg total) by mouth 2 (two) times daily. 09/23/15  Yes Debbe Odea, MD  pravastatin (PRAVACHOL) 10 MG tablet Take 1 tablet (10 mg total) by mouth daily. 09/23/15  Yes Debbe Odea, MD  ondansetron (ZOFRAN ODT) 4 MG disintegrating tablet Take 1 tablet (4 mg total) by mouth every 8 (eight) hours as needed for nausea or vomiting. 04/20/16   Waynetta Pean, PA-C       Physical Exam: BP 117/64 (BP Location: Left Arm)   Pulse (!) 127   Temp 98 F (36.7 C) (Oral)   Resp 19   Ht 5\' 6"  (1.676 m)   Wt (!) 140.6 kg (310 lb)   LMP 10/10/2012   SpO2 97%   BMI 50.04 kg/m  General appearance: Obese adult female, alert and in moderate distress from nausea.   Eyes: Anicteric, conjunctiva pink, lids and lashes normal. Pupils pinpoint.    ENT: No nasal deformity, discharge, epistaxis.  Hearing normal. OP moist without lesions.   Neck: No neck masses.  Trachea midline.  No thyromegaly/tenderness. Lymph: No cervical or supraclavicular lymphadenopathy. Skin: Warm and dry.   No suspicious  rashes or lesions. Cardiac: Tachcyardic, regular, nl S1-S2, no murmurs appreciated.  Capillary refill is brisk.  JVP not visible.  No LE edema.  Radial and DP pulses 2+ and symmetric. Respiratory: Normal respiratory rate and rhythm.  CTAB without rales or wheezes. Abdomen: Abdomen soft with voluntary guarding.  Diffuse nonfocal TTP with light palpation,no rigidity. No ascites, distension, hepatosplenomegaly.   MSK: No deformities or effusions.  No cyanosis or clubbing. Neuro: Cranial nerves symmertic.  Sensation intact to light touch. Speech is fluent.  Muscle strength globally weak,symmetric.    Psych: Sensorium intact and responding to questions, attention normal.  Behavior appropriate.  Affect tired.  Judgment and insight appear normal.     Labs on Admission:  I have personally reviewed following labs and imaging studies: CBC:  Recent Labs Lab 04/20/16 1207 04/23/16 1549  WBC 10.9* 12.3*  HGB 11.5* 11.7*  HCT 33.9* 35.2*  MCV 85.8 86.3  PLT 383 829   Basic Metabolic Panel:  Recent Labs Lab 04/20/16 1207 04/23/16 1549  NA 133* 135  K 4.1 3.7  CL 99* 101  CO2 22 24    GLUCOSE 375* 446*  BUN 13 14  CREATININE 1.01* 1.35*  CALCIUM 9.6 9.3   GFR: Estimated Creatinine Clearance: 70.6 mL/min (by C-G formula based on SCr of 1.35 mg/dL (H)).  Liver Function Tests:  Recent Labs Lab 04/20/16 1210  AST 32  ALT 18  ALKPHOS 109  BILITOT 1.1  PROT 8.1  ALBUMIN 3.7    Recent Labs Lab 04/20/16 1210  LIPASE 24   No results for input(s): AMMONIA in the last 168 hours. Coagulation Profile: No results for input(s): INR, PROTIME in the last 168 hours. Cardiac Enzymes: No results for input(s): CKTOTAL, CKMB, CKMBINDEX, TROPONINI in the last 168 hours. BNP (last 3 results) No results for input(s): PROBNP in the last 8760 hours. HbA1C: No results for input(s): HGBA1C in the last 72 hours. CBG:  Recent Labs Lab 04/23/16 1430  GLUCAP 376*   Lipid Profile: No results for input(s): CHOL, HDL, LDLCALC, TRIG, CHOLHDL, LDLDIRECT in the last 72 hours. Thyroid Function Tests: No results for input(s): TSH, T4TOTAL, FREET4, T3FREE, THYROIDAB in the last 72 hours. Anemia Panel: No results for input(s): VITAMINB12, FOLATE, FERRITIN, TIBC, IRON, RETICCTPCT in the last 72 hours. Sepsis Labs: Invalid input(s): PROCALCITONIN, LACTICIDVEN No results found for this or any previous visit (from the past 240 hour(s)).       Radiological Exams on Admission: Personally reviewed CXR shows no pneumonia: Dg Chest 2 View  Result Date: 04/23/2016 CLINICAL DATA:  Central chest pains and all over abd pain for 3-4 days. SOB. Weakness. Fever. N/V for past 3 days. Pt takes HTN meds. IDDM. Nonsmoker. EXAM: CHEST  2 VIEW COMPARISON:  04/20/2016 FINDINGS: Lateral view degraded by patient arm position. Midline trachea. Normal heart size and mediastinal contours. No pleural effusion or pneumothorax. Clear lungs. IMPRESSION: No acute cardiopulmonary disease. Electronically Signed   By: Abigail Miyamoto M.D.   On: 04/23/2016 15:13    EKG: Independently reviewed. Rate 121, QTc 477, no  ST changes, sinus tahcycardia.  Echocardiogram 12/2015: EF 50-55% Grade 2 DD Moderate LVH  NM perfusion study 12/2015: Low risk        Assessment/Plan  1. Nausea and vomiting, suspect gastroparesis:    -Obtain AXR to rule out SBO -IV Reglan before every meal -IV Zofran -IV fluids -IV Pepcid -Avoid narcotics   2. Mild AKI:  -Fluids and trend Cr  3. Insulin-dependent diabetes:  Very hyperglycemic at admission. Admits to not taking insulin because of vomiting for the last 3 days. Gap and bicarbonate normal. -Lantus 40 units nightly at night, may need twice a day -Insulin resistant sliding scale  4. Hypertension and history of stroke:  Hypertensive at admission. -Attempt amlodipine -If unable to take by mouth at all, we'll start IV labetalol when necessary -Hold metoprolol, statin until able to take PO  5. Other medications:  -Hold allopurinol, duloxetine, dicyclomine until able to take PO           DVT prophylaxis: Lovenox  Code Status: FULL  Family Communication: None present  Disposition Plan: Anticipate IV fluids and Reglan, antiemetics. Re-eval clinically tomorrow.  May need additional stay. Consults called: None Admission status: OBS At the point of initial evaluation, it is my clinical opinion that admission for OBSERVATION is reasonable and necessary because the patient's presenting complaints in the context of their chronic conditions represent sufficient risk of deterioration or significant morbidity to constitute reasonable grounds for close observation in the hospital setting, but that the patient may be medically stable for discharge from the hospital within 24 to 48 hours.    Medical decision making: Patient seen at 10:06 PM on 04/23/2016.  The patient was discussed with Dr.Nanavati.  What exists of the patient's chart was reviewed in depth and summarized above.  Clinical condition: stable.        Edwin Dada [facility] Pager [phone]

## 2016-04-23 NOTE — ED Notes (Signed)
Pt feels dizzy BP taken.

## 2016-04-24 LAB — COMPREHENSIVE METABOLIC PANEL
ALBUMIN: 3.3 g/dL — AB (ref 3.5–5.0)
ALK PHOS: 77 U/L (ref 38–126)
ALT: 13 U/L — ABNORMAL LOW (ref 14–54)
ANION GAP: 7 (ref 5–15)
AST: 14 U/L — ABNORMAL LOW (ref 15–41)
BUN: 17 mg/dL (ref 6–20)
CO2: 26 mmol/L (ref 22–32)
Calcium: 8.3 mg/dL — ABNORMAL LOW (ref 8.9–10.3)
Chloride: 107 mmol/L (ref 101–111)
Creatinine, Ser: 1.36 mg/dL — ABNORMAL HIGH (ref 0.44–1.00)
GFR calc Af Amer: 51 mL/min — ABNORMAL LOW (ref >60)
GFR calc non Af Amer: 44 mL/min — ABNORMAL LOW (ref >60)
GLUCOSE: 248 mg/dL — AB (ref 65–99)
POTASSIUM: 3 mmol/L — AB (ref 3.5–5.1)
SODIUM: 140 mmol/L (ref 135–145)
Total Bilirubin: 0.5 mg/dL (ref 0.3–1.2)
Total Protein: 6.8 g/dL (ref 6.5–8.1)

## 2016-04-24 LAB — CBC
HEMATOCRIT: 30 % — AB (ref 36.0–46.0)
HEMOGLOBIN: 9.8 g/dL — AB (ref 12.0–15.0)
MCH: 28.2 pg (ref 26.0–34.0)
MCHC: 32.7 g/dL (ref 30.0–36.0)
MCV: 86.5 fL (ref 78.0–100.0)
Platelets: 332 10*3/uL (ref 150–400)
RBC: 3.47 MIL/uL — ABNORMAL LOW (ref 3.87–5.11)
RDW: 13.8 % (ref 11.5–15.5)
WBC: 12.8 10*3/uL — AB (ref 4.0–10.5)

## 2016-04-24 LAB — TROPONIN I: Troponin I: <0.03 ng/mL (ref <0.03)

## 2016-04-24 LAB — GLUCOSE, CAPILLARY
GLUCOSE-CAPILLARY: 201 mg/dL — AB (ref 65–99)
Glucose-Capillary: 232 mg/dL — ABNORMAL HIGH (ref 65–99)
Glucose-Capillary: 167 mg/dL — ABNORMAL HIGH (ref 65–99)
Glucose-Capillary: 140 mg/dL — ABNORMAL HIGH (ref 65–99)

## 2016-04-24 LAB — HIV ANTIBODY (ROUTINE TESTING W REFLEX): HIV Screen 4th Generation wRfx: NONREACTIVE

## 2016-04-24 MED ORDER — SODIUM CHLORIDE 0.9 % IV SOLN
30.0000 meq | Freq: Once | INTRAVENOUS | Status: DC
  Filled 2016-04-24: qty 15

## 2016-04-24 MED ORDER — DULOXETINE HCL 30 MG PO CPEP
30.0000 mg | ORAL_CAPSULE | Freq: Two times a day (BID) | ORAL | Status: DC
  Administered 2016-04-24 – 2016-04-29 (×11): 30 mg via ORAL
  Filled 2016-04-24 (×11): qty 1

## 2016-04-24 MED ORDER — DICYCLOMINE HCL 20 MG PO TABS
20.0000 mg | ORAL_TABLET | Freq: Two times a day (BID) | ORAL | Status: DC
  Administered 2016-04-24 – 2016-04-25 (×4): 20 mg via ORAL
  Filled 2016-04-24 (×5): qty 1

## 2016-04-24 MED ORDER — METOPROLOL SUCCINATE ER 25 MG PO TB24
25.0000 mg | ORAL_TABLET | Freq: Every day | ORAL | Status: DC
  Administered 2016-04-24 – 2016-04-29 (×6): 25 mg via ORAL
  Filled 2016-04-24 (×6): qty 1

## 2016-04-24 MED ORDER — PRAVASTATIN SODIUM 20 MG PO TABS
10.0000 mg | ORAL_TABLET | Freq: Every day | ORAL | Status: DC
  Administered 2016-04-24 – 2016-04-29 (×6): 10 mg via ORAL
  Filled 2016-04-24 (×6): qty 1

## 2016-04-24 MED ORDER — PANTOPRAZOLE SODIUM 40 MG PO TBEC
40.0000 mg | DELAYED_RELEASE_TABLET | Freq: Two times a day (BID) | ORAL | Status: DC
  Administered 2016-04-24 – 2016-04-29 (×11): 40 mg via ORAL
  Filled 2016-04-24 (×11): qty 1

## 2016-04-24 NOTE — Progress Notes (Signed)
PROGRESS NOTE    Deborah [name]  SNK:[id number] DOB: [birth year] DOA: 04/23/2016 PCP: Nolon Bussing, PA   Brief Narrative:  Deborah [name] is a 51 y.o. female with a past medical history significant for IDDM and gastroparesis, HTN, CKD III, hx of CVA, hx of PUD no GIB, and gout who presents with vomiting for 5 days. The patient was in her usual state of health until about 5 or 6 days ago when she developed heartburn, diffuse abdominal cramps, and recurrent vomiting with any oral intake-all similar to her previous episodes of gastroparesis. She was seen 3 days ago in the ED, got IV fluids, Zofran, Haldol, cycling, and fentanyl 100 g, and felt improved and was able to go home. However, within a day, she was as bad as she had been before, with constant crampy abdominal discomfort, heartburn that is severe, vomiting with even water crackers, unable to take any of her blood pressure medicines, Reglan, or dicyclomine, and afraid to take her insulin.  Today she began to feel weaker and weaker, so she returned to the Er. Was admitted for Nausea/Vomiting/Abdominal Pain from Gastroparesis and is slowly improving. States Pain in Abdomen was 7/10 in [place] today.   Assessment & Plan:   Active Problems:   Essential hypertension, benign   Uncontrolled type 2 diabetes mellitus with diabetic neuropathy, with long-term current use of insulin (HCC)   CKD (chronic kidney disease) stage 3, GFR 30-59 ml/min   Nausea and vomiting   Gastroparesis   AKI (acute kidney injury) ([place])  Nausea/Vomiting/Abdominal Pain 2/2 to Gastroparesis from Diabetes Mellitus  -S/p 1 liter bolus; C/w NS at 100 mL/hr -C/w Metoclopramide 10 mg IV q8h -C/w Zofran 4 mg po/IV q6hprn for Nausea -Patient received Carafate 1 gram po Once last Night -C/w -Dicyclomine 20 mg po BID was held on Admission and will restart -Will restart Protonix 40 mg po BID; C/w Famotidine 20 mg IV q12h -Received IV Dilaudid 1 mg and Fentanyl 50  mcg Iv; Avoid Narcotics -Advance as Tolerated to Soft Heart Healthy Carb Modified Diet when able; Currently on Clear Liquids and will try to advance to Full Liquids  -Was not able to follow up with Gastroenterology as an outpatient since last hospital Discharge  Uncontrolled Insulin Dependent Diabetes Mellitus Type 2 complicated by Neuropathy and Gastroparesis -C/w Lantus 40 units sq qHS and with Resistant Novolog SSI ACHS -**Takes Novolog 70/30 as an out patient and takes 75 every morning, 60 units with lunch and 50 units with dinner and has been non-compliant last 3 days because of vomiting -C/w Duloxetine 30 mg po BID -Hemoglobin A1c was 9.7 on 03/01/16 -Appreciate Diabetic Education Coordinator Evaluation -CBG running from 167-360  Hypokalemia -Patient's K+ was 3.0 this AM -Replete with IV 30 mEQ of Kcl MultiRun -Repeat CMP in AM  Leukocytosis likley Reactive from N/V -Patient's WBC went from 12.3 -> 12.8 -No active S/Sx of Infection -Continue to Monitor and Repeat CBC  AKI  -Likely Pre-renal in Nature from Vomiting  -Patient's BUN/Cr was 17/1.36 (baseline Cr was around 0.9-1.1) -C/w IVF Rehydration with NS at 100 mL/hr -Repeat CMP in AM  Dyslipidemia associated withType 2 Diabetes Mellitus -C/w Pravastatin 10 mg po Daily  Essential Hypertension -C/w Amlodipine 10 mg po daily; Metoprolol Succinate 25 mg po Daily was held on Admission and will restart  Gout -Allopurinol 100 mg po Daily held currently  Fibromyalgia -C/w Duloxetine 30 mg pog BID  DVT prophylaxis: Lovenox 60 mg sq qHS Code Status: FULL CODE  Family Communication: No Family present at bedside Disposition Plan: Home when Stable for D/C  Consultants:   None   Procedures: None  Antimicrobials: Anti-infectives    None     Subjective: Seen this AM and was resting and awoke from Sleep. States Nausea is somewhat better but still has 7/10 Abdominal Pain. No CP or SOB. No other concerns or  complaints.   Objective: Vitals:   04/23/16 2255 04/23/16 2301 04/23/16 2330 04/24/16 0438  BP: 142/71 142/71 119/76 105/66  Pulse: (!) 121 120 (!) 118 (!) 114  Resp:  18 18 18   Temp:   98.2 F (36.8 C) 98.6 F (37 C)  TempSrc:   Oral Oral  SpO2: 93% 90% 97% 100%  Weight:   133.5 kg (294 lb 5 oz)   Height:        Intake/Output Summary (Last 24 hours) at 04/24/16 1416 Last data filed at 04/23/16 2205  Gross per 24 hour  Intake             1000 ml  Output                0 ml  Net             1000 ml   Filed Weights   04/23/16 1435 04/23/16 2330  Weight: (!) 140.6 kg (310 lb) 133.5 kg (294 lb 5 oz)   Examination: Physical Exam:  Constitutional: WN/WD morbidly obese AAF appears calm and comfortable and in NAD resting ENMT: External Ears, Nose appear normal. Grossly normal hearing. Neck: Appears normal, supple, no cervical masses, normal ROM, no appreciable thyromegaly Respiratory: Diminished to auscultation bilaterally, no wheezing, rales, rhonchi or crackles. Normal respiratory effort and patient is not tachypenic. No accessory muscle use.  Cardiovascular: RRR, no murmurs / rubs / gallops. S1 and S2 auscultated. No appreciable extremity edema.  Abdomen: Soft, mildly tender to palpate, non-distended. No masses palpated. No appreciable hepatosplenomegaly. Bowel sounds positive x4 .  GU: Deferred. Musculoskeletal: No clubbing / cyanosis of digits/nails. No joint deformity upper and lower extremities.  Skin: No rashes, lesions, ulcers on limited. No induration; Warm and dry.  Neurologic: CN 2-12 grossly intact with no focal deficits. Romberg sign cerebellar reflexes not assessed.  Psychiatric: Normal judgment and insight. Alert and oriented x 3. Depressed mood and flat affect.   Data Reviewed: I have personally reviewed following labs and imaging studies  CBC:  Recent Labs Lab 04/20/16 1207 04/23/16 1549 04/24/16 0600  WBC 10.9* 12.3* 12.8*  HGB 11.5* 11.7* 9.8*  HCT  33.9* 35.2* 30.0*  MCV 85.8 86.3 86.5  PLT 383 374 662   Basic Metabolic Panel:  Recent Labs Lab 04/20/16 1207 04/23/16 1549 04/24/16 0600  NA 133* 135 140  K 4.1 3.7 3.0*  CL 99* 101 107  CO2 22 24 26   GLUCOSE 375* 446* 248*  BUN 13 14 17   CREATININE 1.01* 1.35* 1.36*  CALCIUM 9.6 9.3 8.3*   GFR: Estimated Creatinine Clearance: 68 mL/min (by C-G formula based on SCr of 1.36 mg/dL (H)). Liver Function Tests:  Recent Labs Lab 04/20/16 1210 04/24/16 0600  AST 32 14*  ALT 18 13*  ALKPHOS 109 77  BILITOT 1.1 0.5  PROT 8.1 6.8  ALBUMIN 3.7 3.3*    Recent Labs Lab 04/20/16 1210  LIPASE 24   No results for input(s): AMMONIA in the last 168 hours. Coagulation Profile: No results for input(s): INR, PROTIME in the last 168 hours. Cardiac Enzymes:  Recent Labs Lab  04/24/16 0600  TROPONINI <0.03   BNP (last 3 results) No results for input(s): PROBNP in the last 8760 hours. HbA1C: No results for input(s): HGBA1C in the last 72 hours. CBG:  Recent Labs Lab 04/23/16 1430 04/23/16 2324 04/24/16 0741 04/24/16 1206  GLUCAP 376* 360* 232* 167*   Lipid Profile: No results for input(s): CHOL, HDL, LDLCALC, TRIG, CHOLHDL, LDLDIRECT in the last 72 hours. Thyroid Function Tests: No results for input(s): TSH, T4TOTAL, FREET4, T3FREE, THYROIDAB in the last 72 hours. Anemia Panel: No results for input(s): VITAMINB12, FOLATE, FERRITIN, TIBC, IRON, RETICCTPCT in the last 72 hours. Sepsis Labs: No results for input(s): PROCALCITON, LATICACIDVEN in the last 168 hours.  No results found for this or any previous visit (from the past 240 hour(s)).   Radiology Studies: Dg Chest 2 View  Result Date: 04/23/2016 CLINICAL DATA:  Central chest pains and all over abd pain for 3-4 days. SOB. Weakness. Fever. N/V for past 3 days. Pt takes HTN meds. IDDM. Nonsmoker. EXAM: CHEST  2 VIEW COMPARISON:  04/20/2016 FINDINGS: Lateral view degraded by patient arm position. Midline trachea.  Normal heart size and mediastinal contours. No pleural effusion or pneumothorax. Clear lungs. IMPRESSION: No acute cardiopulmonary disease. Electronically Signed   By: Abigail Miyamoto M.D.   On: 04/23/2016 15:13   Dg Abd 2 Views  Result Date: 04/23/2016 CLINICAL DATA:  Initial evaluation for acute mid abdominal pain. History of diabetes, gastric ulcer, gastroparesis, prior cholecystectomy. The EXAM: ABDOMEN - 2 VIEW COMPARISON:  None. FINDINGS: Paucity of gas limits evaluation of the bowels. No evidence for obstruction or ileus. No free air. No soft tissue mass or abnormal calcification. Cholecystectomy clips noted. Visualized lung bases are clear. No acute osseus abnormality. Degenerative changes noted about the hips bilaterally. IMPRESSION: Nonobstructive bowel gas pattern with no radiographic evidence for acute intra-abdominal process. Electronically Signed   By: Jeannine Boga M.D.   On: 04/23/2016 22:21   Scheduled Meds: . amLODipine  10 mg Oral Daily  . dicyclomine  20 mg Oral BID  . DULoxetine  30 mg Oral BID  . enoxaparin (LOVENOX) injection  60 mg Subcutaneous QHS  . famotidine (PEPCID) IV  20 mg Intravenous Q12H  . insulin aspart  0-20 Units Subcutaneous TID WC  . insulin aspart  0-5 Units Subcutaneous QHS  . insulin glargine  40 Units Subcutaneous QHS  . metoCLOPramide (REGLAN) injection  10 mg Intravenous Q8H  . metoprolol succinate  25 mg Oral Daily  . pantoprazole  40 mg Oral BID  . potassium chloride (KCL MULTIRUN) 30 mEq in 265 mL IVPB  30 mEq Intravenous Once  . pravastatin  10 mg Oral Daily   Continuous Infusions: . sodium chloride 100 mL/hr at 04/23/16 2343    LOS: 0 days   Kerney Elbe, DO [facility] Pager [phone]  If 7PM-7AM, please contact night-coverage [email or web address] Password [email or web address] 04/24/2016, 2:16 PM

## 2016-04-25 ENCOUNTER — Inpatient Hospital Stay (HOSPITAL_COMMUNITY): Payer: Self-pay

## 2016-04-25 DIAGNOSIS — R072 Precordial pain: Secondary | ICD-10-CM

## 2016-04-25 DIAGNOSIS — R079 Chest pain, unspecified: Secondary | ICD-10-CM

## 2016-04-25 LAB — CBC WITH DIFFERENTIAL/PLATELET
BASOS ABS: 0.1 10*3/uL (ref 0.0–0.1)
BASOS PCT: 0 %
EOS ABS: 0.2 10*3/uL (ref 0.0–0.7)
Eosinophils Relative: 2 %
HEMATOCRIT: 32.8 % — AB (ref 36.0–46.0)
Hemoglobin: 10.8 g/dL — ABNORMAL LOW (ref 12.0–15.0)
Lymphocytes Relative: 35 %
Lymphs Abs: 4 10*3/uL (ref 0.7–4.0)
MCH: 28.1 pg (ref 26.0–34.0)
MCHC: 32.9 g/dL (ref 30.0–36.0)
MCV: 85.2 fL (ref 78.0–100.0)
MONO ABS: 0.6 10*3/uL (ref 0.1–1.0)
Monocytes Relative: 5 %
NEUTROS ABS: 6.5 10*3/uL (ref 1.7–7.7)
Neutrophils Relative %: 58 %
PLATELETS: 367 10*3/uL (ref 150–400)
RBC: 3.85 MIL/uL — ABNORMAL LOW (ref 3.87–5.11)
RDW: 13.9 % (ref 11.5–15.5)
WBC: 11.2 10*3/uL — ABNORMAL HIGH (ref 4.0–10.5)

## 2016-04-25 LAB — COMPREHENSIVE METABOLIC PANEL
ALBUMIN: 3.9 g/dL (ref 3.5–5.0)
ALT: 17 U/L (ref 14–54)
ANION GAP: 11 (ref 5–15)
AST: 25 U/L (ref 15–41)
Alkaline Phosphatase: 82 U/L (ref 38–126)
BILIRUBIN TOTAL: 0.7 mg/dL (ref 0.3–1.2)
BUN: 11 mg/dL (ref 6–20)
CHLORIDE: 105 mmol/L (ref 101–111)
CO2: 24 mmol/L (ref 22–32)
Calcium: 8.8 mg/dL — ABNORMAL LOW (ref 8.9–10.3)
Creatinine, Ser: 1.09 mg/dL — ABNORMAL HIGH (ref 0.44–1.00)
GFR calc Af Amer: >60 mL/min (ref >60)
GFR, EST NON AFRICAN AMERICAN: 57 mL/min — AB (ref >60)
GLUCOSE: 193 mg/dL — AB (ref 65–99)
POTASSIUM: 2.7 mmol/L — AB (ref 3.5–5.1)
Sodium: 140 mmol/L (ref 135–145)
TOTAL PROTEIN: 8.2 g/dL — AB (ref 6.5–8.1)

## 2016-04-25 LAB — PHOSPHORUS: Phosphorus: 2 mg/dL — ABNORMAL LOW (ref 2.5–4.6)

## 2016-04-25 LAB — ECHOCARDIOGRAM COMPLETE
Height: 66 in
Weight: 4709.03 oz

## 2016-04-25 LAB — GLUCOSE, CAPILLARY
GLUCOSE-CAPILLARY: 276 mg/dL — AB (ref 65–99)
GLUCOSE-CAPILLARY: 144 mg/dL — AB (ref 65–99)
Glucose-Capillary: 272 mg/dL — ABNORMAL HIGH (ref 65–99)
Glucose-Capillary: 135 mg/dL — ABNORMAL HIGH (ref 65–99)

## 2016-04-25 LAB — TROPONIN I

## 2016-04-25 LAB — MAGNESIUM: MAGNESIUM: 1.2 mg/dL — AB (ref 1.7–2.4)

## 2016-04-25 LAB — BASIC METABOLIC PANEL
Anion gap: 13 (ref 5–15)
BUN: 8 mg/dL (ref 6–20)
CHLORIDE: 102 mmol/L (ref 101–111)
CO2: 22 mmol/L (ref 22–32)
CREATININE: 1.06 mg/dL — AB (ref 0.44–1.00)
Calcium: 8.5 mg/dL — ABNORMAL LOW (ref 8.9–10.3)
GFR calc Af Amer: >60 mL/min (ref >60)
GFR calc non Af Amer: 59 mL/min — ABNORMAL LOW (ref >60)
Glucose, Bld: 305 mg/dL — ABNORMAL HIGH (ref 65–99)
Potassium: 2.8 mmol/L — ABNORMAL LOW (ref 3.5–5.1)
Sodium: 137 mmol/L (ref 135–145)

## 2016-04-25 MED ORDER — POTASSIUM CHLORIDE CRYS ER 20 MEQ PO TBCR
40.0000 meq | EXTENDED_RELEASE_TABLET | Freq: Once | ORAL | Status: AC
  Administered 2016-04-25: 40 meq via ORAL
  Filled 2016-04-25: qty 2

## 2016-04-25 MED ORDER — HYDROMORPHONE HCL 1 MG/ML IJ SOLN
1.0000 mg | Freq: Once | INTRAMUSCULAR | Status: AC
  Administered 2016-04-25: 1 mg via INTRAVENOUS
  Filled 2016-04-25: qty 1

## 2016-04-25 MED ORDER — MAGNESIUM SULFATE 2 GM/50ML IV SOLN
2.0000 g | Freq: Once | INTRAVENOUS | Status: AC
  Administered 2016-04-25: 2 g via INTRAVENOUS
  Filled 2016-04-25: qty 50

## 2016-04-25 MED ORDER — POTASSIUM CHLORIDE 2 MEQ/ML IV SOLN
30.0000 meq | Freq: Two times a day (BID) | INTRAVENOUS | Status: AC
Start: 2016-04-25 — End: 2016-04-26
  Administered 2016-04-25 (×2): 30 meq via INTRAVENOUS
  Filled 2016-04-25 (×2): qty 15

## 2016-04-25 MED ORDER — OXYCODONE HCL 5 MG PO TABS
5.0000 mg | ORAL_TABLET | Freq: Three times a day (TID) | ORAL | Status: DC | PRN
  Administered 2016-04-26 – 2016-04-29 (×4): 5 mg via ORAL
  Filled 2016-04-25 (×4): qty 1

## 2016-04-25 NOTE — Progress Notes (Signed)
Md. Called back. No new orders given.

## 2016-04-25 NOTE — Progress Notes (Signed)
Rm 1517 Colantonio is c/o chest pain in the center of her chest without radiation. Reports she can recreate the pain with deep inspiration. Denies being SOB or dizzy. States she got up to go the RR and when she got back into bed the pain started Md. Paged.

## 2016-04-25 NOTE — Progress Notes (Signed)
CRITICAL VALUE ALERT  Critical value received:  K+ 2.7  Date of notification:  04/25/16  Time of notification:  0338  Critical value read back:yes  Nurse who received alert:  Ivin Poot RN  MD notified (1st page):  Dr. Maudie Mercury  Time of first page:  0343  MD notified (2nd page):n/a  Time of second page:n/a  Responding MD:  Dr. Maudie Mercury  Time MD responded:  [phone]

## 2016-04-25 NOTE — Progress Notes (Signed)
  Echocardiogram 2D Echocardiogram has been performed.  Darlina Sicilian M 04/25/2016, 10:39 AM

## 2016-04-25 NOTE — Progress Notes (Addendum)
PROGRESS NOTE    GIZELLA BELLEVILLE  XFG:[id number] DOB: 12-Nov-1965 DOA: 04/23/2016 PCP: Nolon Bussing, PA   Brief Narrative:  SHEMEKIA PATANE is a 51 y.o. female with a past medical history significant for IDDM and gastroparesis, HTN, CKD III, hx of CVA, hx of PUD no GIB, and gout who presents with vomiting for 5 days. The patient was in her usual state of health until about 5 or 6 days ago when she developed heartburn, diffuse abdominal cramps, and recurrent vomiting with any oral intake-all similar to her previous episodes of gastroparesis. She was seen 3 days ago in the ED, got IV fluids, Zofran, Haldol, cycling, and fentanyl 100 g, and felt improved and was able to go home. However, within a day, she was as bad as she had been before, with constant crampy abdominal discomfort, heartburn that is severe, vomiting with even water crackers, unable to take any of her blood pressure medicines, Reglan, or dicyclomine, and afraid to take her insulin.  Today she began to feel weaker and weaker, so she returned to the Er. Was admitted for Nausea/Vomiting/Abdominal Pain from Gastroparesis and is slowly improving. States Pain in Abdomen was 7/10 in [place] yesterday. Developed Chest Pain overnight and complained of severe pain this AM that she stated she needed something to help. Has not attempted eating at all and only been drinking water. Patient stated she was nauseous and vomiting all night but per nursing report patient was sleeping and did not vomit.   Assessment & Plan:   Active Problems:   Essential hypertension, benign   Uncontrolled type 2 diabetes mellitus with diabetic neuropathy, with long-term current use of insulin (HCC)   CKD (chronic kidney disease) stage 3, GFR 30-59 ml/min   Nausea and vomiting   Gastroparesis   AKI (acute kidney injury) ([place])  Nausea/Vomiting/Abdominal Pain 2/2 to Gastroparesis from Diabetes Mellitus  -S/p 1 liter bolus; C/w NS at 100 mL/hr -C/w  Metoclopramide 10 mg IV q8h -C/w Zofran 4 mg po/IV q6hprn for Nausea -Patient received Carafate 1 gram po Once last Night -Dicyclomine 20 mg po BID was held on Admission and will restart -Will restart Protonix 40 mg po BID; C/w Famotidine 20 mg IV q12h -Received IV Dilaudid 1 mg and Fentanyl 50 mcg Iv; Try to Avoid Narcotics but had to give 1x dose of IV Dilaudid; Restarted Home Oxy IR for severe pain -Advance as Tolerated to Soft Heart Healthy Carb Modified Diet when able; Currently on Clear Liquids and will try to advance to Full Liquids; *HAS NOT EATEN ANYTHING -Was not able to follow up with Gastroenterology as an outpatient since last hospital Discharge -May discuss with Gastroenterology in AM if not improved.   Uncontrolled Insulin Dependent Diabetes Mellitus Type 2 complicated by Neuropathy and Gastroparesis -C/w Lantus 40 units sq qHS and with Resistant Novolog SSI ACHS -**Takes Novolog 70/30 as an out patient and takes 75 every morning, 60 units with lunch and 50 units with dinner and has been non-compliant last 3 days because of vomiting -C/w Duloxetine 30 mg po BID -Hemoglobin A1c was 9.7 on 03/01/16 -Appreciate Diabetic Education Coordinator Evaluation -CBG running from 140-276  Atypical CP r/o'dACS; Radiated into MidEpigastric Region and worse with Deep Inspiration -Troponin I <0.03 x 4 -EKG showed Sinus Tachycardia but no evidence of ST-Elevation or Depression -C/w Metoprolol Succinate 25 mg po Daily and with Pravastatin 10 mg po Daily -ECHOCardiogram showed mild LVH with Systolic Fxn normal and estimated EF of 60-65% with no regional  wall motion abnormalities and a trivial pericardial effusion -Has not eaten anything and only been drinking water -CXR orderd this AM and just got done  Hypokalemia -Patient's K+ was 2.7 this AM and Repeat was 2.8 -Replete with IV 30 mEQ of Kcl MultiRun BID; Replace with po 40 mEQ BID -Repeat CMP in AM  Leukocytosis likley Reactive from  N/V -Patient's WBC went from 12.3 -> 12.8 -> 11.2 -No active S/Sx of Infection -CXR done for Chest Pain pending  -Continue to Monitor and Repeat CBC  AKI, improving -Likely Pre-renal in Nature from Vomiting  -Patient's BUN/Cr went from 17/1.36 -> 11/1.09 (baseline Cr was around 0.9-1.1) -C/w IVF Rehydration with NS at 100 mL/hr -Repeat CMP in AM  Dyslipidemia associated withType 2 Diabetes Mellitus -C/w Pravastatin 10 mg po Daily  Essential Hypertension -C/w Amlodipine 10 mg po daily; Metoprolol Succinate 25 mg po Daily was held on Admission and will restart  Gout -Allopurinol 100 mg po Daily held currently  Fibromyalgia -C/w Duloxetine 30 mg pog BID  DVT prophylaxis: Lovenox 60 mg sq qHS Code Status: FULL CODE Family Communication: No Family present at bedside Disposition Plan: Home when Stable for D/C  Consultants:   None   Procedures: ECHOCARDIOGRAM Study Conclusions  - Left ventricle: The cavity size was normal. Wall thickness was   increased in a pattern of mild LVH. Systolic function was normal.   The estimated ejection fraction was in the range of 60% to 65%.   Wall motion was normal; there were no regional wall motion   abnormalities. - Pericardium, extracardiac: A trivial pericardial effusion was   identified.  Antimicrobials: Anti-infectives    None     Subjective: Seen this AM and states she did not rest last night because was in tremendous amount of Pain. States she developed Chest pain last night radiating into her abdomen similar to prior episodes. States she has not attempted to eat anything and has been getting Metoclopramide scheduled.   Objective: Vitals:   04/24/16 2000 04/25/16 0220 04/25/16 0510 04/25/16 1404  BP: 103/79 (!) 170/105 (!) 142/75 (!) 141/70  Pulse: 99 (!) 108 (!) 110 99  Resp: 16  19 18   Temp: 98.2 F (36.8 C)  97.7 F (36.5 C) 98.3 F (36.8 C)  TempSrc: Oral  Oral Oral  SpO2: 99% 100% 97% 98%  Weight:       Height:        Intake/Output Summary (Last 24 hours) at 04/25/16 1623 Last data filed at 04/25/16 0800  Gross per 24 hour  Intake             1410 ml  Output                0 ml  Net             1410 ml   Filed Weights   04/23/16 1435 04/23/16 2330  Weight: (!) 140.6 kg (310 lb) 133.5 kg (294 lb 5 oz)   Examination: Physical Exam:  Constitutional: WN/WD morbidly obese AAF appears calm and comfortable and in NAD sitting bedside ENMT: External Ears, Nose appear normal. Grossly normal hearing. Neck: Appears normal, supple, no cervical masses, normal ROM, no appreciable thyromegaly Respiratory: Diminished to auscultation bilaterally, no wheezing, rales, rhonchi or crackles. Normal respiratory effort and patient is not tachypenic. No accessory muscle use.  Cardiovascular: RRR, no murmurs / rubs / gallops. S1 and S2 auscultated. No appreciable extremity edema.  Abdomen: Soft, mildly tender to palpate in midabdomen,  non-distended. No masses palpated. No appreciable hepatosplenomegaly. Bowel sounds positive x4 .  GU: Deferred. Musculoskeletal: No clubbing / cyanosis of digits/nails. No joint deformity upper and lower extremities.  Skin: No rashes, lesions, ulcers on limited. No induration; Warm and dry.  Neurologic: CN 2-12 grossly intact with no focal deficits. Romberg sign cerebellar reflexes not assessed.  Psychiatric: Normal judgment and insight. Alert and oriented x 3. Depressed mood and flat affect.   Data Reviewed: I have personally reviewed following labs and imaging studies  CBC:  Recent Labs Lab 04/20/16 1207 04/23/16 1549 04/24/16 0600 04/25/16 0258  WBC 10.9* 12.3* 12.8* 11.2*  NEUTROABS  --   --   --  6.5  HGB 11.5* 11.7* 9.8* 10.8*  HCT 33.9* 35.2* 30.0* 32.8*  MCV 85.8 86.3 86.5 85.2  PLT 383 374 332 740   Basic Metabolic Panel:  Recent Labs Lab 04/20/16 1207 04/23/16 1549 04/24/16 0600 04/25/16 0258 04/25/16 0834  NA 133* 135 140 140 137  K 4.1 3.7  3.0* 2.7* 2.8*  CL 99* 101 107 105 102  CO2 22 24 26 24 22   GLUCOSE 375* 446* 248* 193* 305*  BUN 13 14 17 11 8   CREATININE 1.01* 1.35* 1.36* 1.09* 1.06*  CALCIUM 9.6 9.3 8.3* 8.8* 8.5*  MG  --   --   --  1.2*  --   PHOS  --   --   --  2.0*  --    GFR: Estimated Creatinine Clearance: 87.2 mL/min (by C-G formula based on SCr of 1.06 mg/dL (H)). Liver Function Tests:  Recent Labs Lab 04/20/16 1210 04/24/16 0600 04/25/16 0258  AST 32 14* 25  ALT 18 13* 17  ALKPHOS 109 77 82  BILITOT 1.1 0.5 0.7  PROT 8.1 6.8 8.2*  ALBUMIN 3.7 3.3* 3.9    Recent Labs Lab 04/20/16 1210  LIPASE 24   No results for input(s): AMMONIA in the last 168 hours. Coagulation Profile: No results for input(s): INR, PROTIME in the last 168 hours. Cardiac Enzymes:  Recent Labs Lab 04/24/16 0600 04/25/16 0258 04/25/16 0834 04/25/16 1506  TROPONINI <0.03 <0.03 <0.03 <0.03   BNP (last 3 results) No results for input(s): PROBNP in the last 8760 hours. HbA1C: No results for input(s): HGBA1C in the last 72 hours. CBG:  Recent Labs Lab 04/24/16 1206 04/24/16 1651 04/24/16 2135 04/25/16 0735 04/25/16 1158  GLUCAP 167* 201* 140* 272* 276*   Lipid Profile: No results for input(s): CHOL, HDL, LDLCALC, TRIG, CHOLHDL, LDLDIRECT in the last 72 hours. Thyroid Function Tests: No results for input(s): TSH, T4TOTAL, FREET4, T3FREE, THYROIDAB in the last 72 hours. Anemia Panel: No results for input(s): VITAMINB12, FOLATE, FERRITIN, TIBC, IRON, RETICCTPCT in the last 72 hours. Sepsis Labs: No results for input(s): PROCALCITON, LATICACIDVEN in the last 168 hours.  No results found for this or any previous visit (from the past 240 hour(s)).   Radiology Studies: Dg Abd 2 Views  Result Date: 04/23/2016 CLINICAL DATA:  Initial evaluation for acute mid abdominal pain. History of diabetes, gastric ulcer, gastroparesis, prior cholecystectomy. The EXAM: ABDOMEN - 2 VIEW COMPARISON:  None. FINDINGS: Paucity  of gas limits evaluation of the bowels. No evidence for obstruction or ileus. No free air. No soft tissue mass or abnormal calcification. Cholecystectomy clips noted. Visualized lung bases are clear. No acute osseus abnormality. Degenerative changes noted about the hips bilaterally. IMPRESSION: Nonobstructive bowel gas pattern with no radiographic evidence for acute intra-abdominal process. Electronically Signed   By: [name]  Jeannine Boga M.D.   On: 04/23/2016 22:21   Scheduled Meds: . amLODipine  10 mg Oral Daily  . dicyclomine  20 mg Oral BID  . DULoxetine  30 mg Oral BID  . enoxaparin (LOVENOX) injection  60 mg Subcutaneous QHS  . famotidine (PEPCID) IV  20 mg Intravenous Q12H  . insulin aspart  0-20 Units Subcutaneous TID WC  . insulin aspart  0-5 Units Subcutaneous QHS  . insulin glargine  40 Units Subcutaneous QHS  . metoCLOPramide (REGLAN) injection  10 mg Intravenous Q8H  . metoprolol succinate  25 mg Oral Daily  . pantoprazole  40 mg Oral BID  . potassium chloride (KCL MULTIRUN) 30 mEq in 265 mL IVPB  30 mEq Intravenous Once  . potassium chloride (KCL MULTIRUN) 30 mEq in 265 mL IVPB  30 mEq Intravenous BID  . pravastatin  10 mg Oral Daily   Continuous Infusions: . sodium chloride 100 mL/hr at 04/25/16 0717    LOS: 1 day   Kerney Elbe, DO [facility] Pager [phone]  If 7PM-7AM, please contact night-coverage [email or web address] Password [facility] 04/25/2016, 4:23 PM

## 2016-04-25 NOTE — Progress Notes (Signed)
Pt. Reports this is the same pain that she has had in the past. The same pain that she had in the ER when she was admitted. States she hasn't eaten anything and has only been drinking water.

## 2016-04-25 NOTE — Progress Notes (Signed)
Paged Md. On call to let him know that the pt. Is still c/o pain.

## 2016-04-26 LAB — CBC WITH DIFFERENTIAL/PLATELET
BASOS PCT: 0 %
Basophils Absolute: 0 10*3/uL (ref 0.0–0.1)
EOS ABS: 0.1 10*3/uL (ref 0.0–0.7)
Eosinophils Relative: 1 %
HCT: 32.4 % — ABNORMAL LOW (ref 36.0–46.0)
HEMOGLOBIN: 10.2 g/dL — AB (ref 12.0–15.0)
Lymphocytes Relative: 33 %
Lymphs Abs: 3.5 10*3/uL (ref 0.7–4.0)
MCH: 27.2 pg (ref 26.0–34.0)
MCHC: 31.5 g/dL (ref 30.0–36.0)
MCV: 86.4 fL (ref 78.0–100.0)
Monocytes Absolute: 0.6 10*3/uL (ref 0.1–1.0)
Monocytes Relative: 6 %
NEUTROS ABS: 6.2 10*3/uL (ref 1.7–7.7)
NEUTROS PCT: 60 %
Platelets: 382 10*3/uL (ref 150–400)
RBC: 3.75 MIL/uL — AB (ref 3.87–5.11)
RDW: 13.9 % (ref 11.5–15.5)
WBC: 10.4 10*3/uL (ref 4.0–10.5)

## 2016-04-26 LAB — COMPREHENSIVE METABOLIC PANEL
ALBUMIN: 3.5 g/dL (ref 3.5–5.0)
ALK PHOS: 83 U/L (ref 38–126)
ALT: 16 U/L (ref 14–54)
AST: 32 U/L (ref 15–41)
Anion gap: 10 (ref 5–15)
BILIRUBIN TOTAL: 0.8 mg/dL (ref 0.3–1.2)
BUN: 9 mg/dL (ref 6–20)
CALCIUM: 8.4 mg/dL — AB (ref 8.9–10.3)
CO2: 23 mmol/L (ref 22–32)
CREATININE: 1.18 mg/dL — AB (ref 0.44–1.00)
Chloride: 105 mmol/L (ref 101–111)
GFR calc Af Amer: >60 mL/min (ref >60)
GFR calc non Af Amer: 52 mL/min — ABNORMAL LOW (ref >60)
GLUCOSE: 178 mg/dL — AB (ref 65–99)
Potassium: 3.1 mmol/L — ABNORMAL LOW (ref 3.5–5.1)
SODIUM: 138 mmol/L (ref 135–145)
Total Protein: 7.3 g/dL (ref 6.5–8.1)

## 2016-04-26 LAB — GLUCOSE, CAPILLARY
GLUCOSE-CAPILLARY: 184 mg/dL — AB (ref 65–99)
Glucose-Capillary: 160 mg/dL — ABNORMAL HIGH (ref 65–99)
Glucose-Capillary: 256 mg/dL — ABNORMAL HIGH (ref 65–99)
Glucose-Capillary: 199 mg/dL — ABNORMAL HIGH (ref 65–99)

## 2016-04-26 LAB — MAGNESIUM: Magnesium: 1.5 mg/dL — ABNORMAL LOW (ref 1.7–2.4)

## 2016-04-26 LAB — PHOSPHORUS: Phosphorus: 1.9 mg/dL — ABNORMAL LOW (ref 2.5–4.6)

## 2016-04-26 MED ORDER — POTASSIUM CHLORIDE CRYS ER 20 MEQ PO TBCR
40.0000 meq | EXTENDED_RELEASE_TABLET | Freq: Two times a day (BID) | ORAL | Status: DC
  Administered 2016-04-26 – 2016-04-29 (×6): 40 meq via ORAL
  Filled 2016-04-26 (×6): qty 2

## 2016-04-26 MED ORDER — INSULIN ASPART 100 UNIT/ML ~~LOC~~ SOLN
4.0000 [IU] | Freq: Three times a day (TID) | SUBCUTANEOUS | Status: DC
  Administered 2016-04-27 – 2016-04-29 (×6): 4 [IU] via SUBCUTANEOUS

## 2016-04-26 NOTE — Progress Notes (Signed)
Inpatient Diabetes Program Recommendations  AACE/ADA: New Consensus Statement on Inpatient Glycemic Control (2015)  Target Ranges:  Prepandial:   less than 140 mg/dL      Peak postprandial:   less than 180 mg/dL (1-2 hours)      Critically ill patients:  140 - 180 mg/dL   Lab Results  Component Value Date   GLUCAP 160 (H) 04/26/2016   HGBA1C 9.7 (H) 03/01/2016    Review of Glycemic Control  Diabetes history: DM2 Outpatient Diabetes medications: Novolog 70/30 75-60-50 units tidwc Current orders for Inpatient glycemic control: Lantus 40 units QHS, Novolog 0-20 units tidwc and hs  Pt has PCP in [place]. Also gets insulin at clinic. Checks blood sugars 2x/day. Pt states she's had a lot of stress lately with housing issues and termination of job. States HgbA1c was around 7.8%.  Hx: gastroparesis. Tolerated first meal today without nausea or vomiting. Will likely need meal coverage insulin since po intake has increased.  Inpatient Diabetes Program Recommendations:    Add Novolog 4 units tidwc for meal coverage insulin if pt eats > 50% meal.  Will need to titrate insulin daily for goal of < 180 mg/dL. Will follow closely.  Thank you. Lorenda Peck, RD, LDN, CDE Inpatient Diabetes Coordinator [phone]

## 2016-04-26 NOTE — Progress Notes (Signed)
PROGRESS NOTE    Deborah [name]  TWS:[id number] DOB: 1965/07/08 DOA: 04/23/2016 PCP: Nolon Bussing, PA   Brief Narrative:  Deborah [name] is a 51 y.o. female with a past medical history significant for IDDM and gastroparesis, HTN, CKD III, hx of CVA, hx of PUD no GIB, and gout who presents with vomiting for 5 days. The patient was in her usual state of health until about 5 or 6 days ago when she developed heartburn, diffuse abdominal cramps, and recurrent vomiting with any oral intake-all similar to her previous episodes of gastroparesis. She was seen 3 days ago in the ED, got IV fluids, Zofran, Haldol, cycling, and fentanyl 100 g, and felt improved and was able to go home. However, within a day, she was as bad as she had been before, with constant crampy abdominal discomfort, heartburn that is severe, vomiting with even water crackers, unable to take any of her blood pressure medicines, Reglan, or dicyclomine, and afraid to take her insulin.  Today she began to feel weaker and weaker, so she returned to the Er. Was admitted for Nausea/Vomiting/Abdominal Pain from Gastroparesis and is slowly improving. States Pain in Abdomen was 7/10 in [place] yesterday. Developed Chest Pain overnight and complained of severe pain this AM that she stated she needed something to help. Has not attempted eating at all and only been drinking water. Patient stated she was nauseous and vomiting all night but per nursing report patient was sleeping and did not vomit. Gastroenterology Dr. Amedeo Plenty was consulted for further recommendations   Assessment & Plan:   Active Problems:   Essential hypertension, benign   Uncontrolled type 2 diabetes mellitus with diabetic neuropathy, with long-term current use of insulin (HCC)   CKD (chronic kidney disease) stage 3, GFR 30-59 ml/min   Nausea and vomiting   Gastroparesis   AKI (acute kidney injury) ([place])  Nausea/Vomiting/Abdominal Pain 2/2 to Gastroparesis from  Diabetes Mellitus  -S/p 1 liter bolus; C/w NS at 100 mL/hr -C/w Metoclopramide 10 mg IV q8h -C/w Zofran 4 mg po/IV q6hprn for Nausea -Patient received Carafate 1 gram po Once last Night -Dicyclomine 20 mg po BID was held on Admission and will restart -Will restart Protonix 40 mg po BID; C/w Famotidine 20 mg IV q12h -Received IV Dilaudid 1 mg and Fentanyl 50 mcg Iv; Try to Avoid Narcotics but had to give 1x dose of IV Dilaudid; Restarted Home Oxy IR for severe pain -Advance as Tolerated to Soft Heart Healthy Carb Modified Diet when able; Advanced to Soft Diet and tolerated first meal today without Nausea or vomiting  -Was not able to follow up with Gastroenterology as an outpatient since last hospital Discharge -Consulted Dr. Amedeo Plenty in Gastroenterology and appreciate evaluation and Recc's; Will continue Reglan and PPI and obtain Gastric Emptying study scan; Depending on results will consider EGD  Uncontrolled Insulin Dependent Diabetes Mellitus Type 2 complicated by Neuropathy and Gastroparesis -C/w Lantus 40 units sq qHS and with Resistant Novolog SSI ACHS -**Takes Novolog 70/30 as an out patient and takes 75 every morning, 60 units with lunch and 50 units with dinner and has been non-compliant last 3 days because of vomiting -C/w Duloxetine 30 mg po BID -Hemoglobin A1c was 9.7 on 03/01/16 -Appreciate Diabetic Education Coordinator Evaluation -Recommends adding Novolog 4 units TIDwc for Meal Coverage -Will need to Titrate Insulin for Goal of <180 -CBG running from 135-256  Atypical CP r/o'dACS; Radiated into MidEpigastric Region and worse with Deep Inspiration -Troponin I <0.03 x 4 -  EKG showed Sinus Tachycardia but no evidence of ST-Elevation or Depression -C/w Metoprolol Succinate 25 mg po Daily and with Pravastatin 10 mg po Daily -ECHOCardiogram showed mild LVH with Systolic Fxn normal and estimated EF of 60-65% with no regional wall motion abnormalities and a trivial pericardial  effusion -Has not eaten anything and only been drinking water -CXR orderd this AM and just got done  Hypokalemia -Patient's K+ was 3.1 -Replete Replace with po 40 mEQ BID -Repeat CMP in AM  Leukocytosis likley Reactive from N/V -Patient's WBC went from 12.3 -> 12.8 -> 11.2 -> 10.4 -No active S/Sx of Infection -CXR done for Chest Pain pending  -Continue to Monitor and Repeat CBC  AKI, improving -Likely Pre-renal in Nature from Vomiting  -Patient's BUN/Cr went from 17/1.36 -> 9/1.18 (baseline Cr was around 0.9-1.1) -C/w IVF Rehydration with NS at 100 mL/hr -Repeat CMP in AM  Dyslipidemia associated withType 2 Diabetes Mellitus -C/w Pravastatin 10 mg po Daily  Essential Hypertension -C/w Amlodipine 10 mg po daily; Metoprolol Succinate 25 mg po Daily was held on Admission and will restart  Gout -Allopurinol 100 mg po Daily held currently  Fibromyalgia -C/w Duloxetine 30 mg pog BID  DVT prophylaxis: Lovenox 60 mg sq qHS Code Status: FULL CODE Family Communication: No Family present at bedside Disposition Plan: Home when Stable for D/C  Consultants:   Gastroenterology Dr. Amedeo Plenty   Procedures: ECHOCARDIOGRAM Study Conclusions  - Left ventricle: The cavity size was normal. Wall thickness was   increased in a pattern of mild LVH. Systolic function was normal.   The estimated ejection fraction was in the range of 60% to 65%.   Wall motion was normal; there were no regional wall motion   abnormalities. - Pericardium, extracardiac: A trivial pericardial effusion was   identified.  Antimicrobials: Anti-infectives    None     Subjective: Seen this AM and states she was feeling a little better and stomach pain was improved. Not as nauseous but had not attempted eating anything. No other concerns or complaints at this time.   Objective: Vitals:   04/25/16 1404 04/25/16 2021 04/26/16 0451 04/26/16 1437  BP: (!) 141/70 138/77 (!) 111/59 (!) 118/57  Pulse: 99 88 91  92  Resp: 18 18 17 18   Temp: 98.3 F (36.8 C) 97.8 F (36.6 C) 98.5 F (36.9 C) 97.5 F (36.4 C)  TempSrc: Oral Oral Oral Oral  SpO2: 98% 99% 98% 100%  Weight:      Height:        Intake/Output Summary (Last 24 hours) at 04/26/16 2100 Last data filed at 04/26/16 1833  Gross per 24 hour  Intake              480 ml  Output                0 ml  Net              480 ml   Filed Weights   04/23/16 1435 04/23/16 2330  Weight: (!) 140.6 kg (310 lb) 133.5 kg (294 lb 5 oz)   Examination: Physical Exam:  Constitutional: WN/WD morbidly obese AAF appears calm and comfortable laying in bed ENMT: External Ears, Nose appear normal. Grossly normal hearing. Neck: Appears normal, supple, no cervical masses, normal ROM, no appreciable thyromegaly Respiratory: Diminished to auscultation bilaterally, no wheezing, rales, rhonchi or crackles. Normal respiratory effort and patient is not tachypenic. No accessory muscle use.  Cardiovascular: RRR, no murmurs / rubs /  gallops. S1 and S2 auscultated. No appreciable extremity edema.  Abdomen: Soft, mildly tender to palpate in mid abdomen, non-distended. No masses palpated. No appreciable hepatosplenomegaly. Bowel sounds positive x4 .  GU: Deferred. Musculoskeletal: No clubbing / cyanosis of digits/nails. No joint deformity upper and lower extremities.  Skin: No rashes, lesions, ulcers on limited. No induration; Warm and dry.  Neurologic: CN 2-12 grossly intact with no focal deficits. Romberg sign cerebellar reflexes not assessed.  Psychiatric: Normal judgment and insight. Alert and oriented x 3. Depressed mood and flat affect.   Data Reviewed: I have personally reviewed following labs and imaging studies  CBC:  Recent Labs Lab 04/20/16 1207 04/23/16 1549 04/24/16 0600 04/25/16 0258 04/26/16 0548  WBC 10.9* 12.3* 12.8* 11.2* 10.4  NEUTROABS  --   --   --  6.5 6.2  HGB 11.5* 11.7* 9.8* 10.8* 10.2*  HCT 33.9* 35.2* 30.0* 32.8* 32.4*  MCV 85.8  86.3 86.5 85.2 86.4  PLT 383 374 332 367 989   Basic Metabolic Panel:  Recent Labs Lab 04/23/16 1549 04/24/16 0600 04/25/16 0258 04/25/16 0834 04/26/16 0548  NA 135 140 140 137 138  K 3.7 3.0* 2.7* 2.8* 3.1*  CL 101 107 105 102 105  CO2 24 26 24 22 23   GLUCOSE 446* 248* 193* 305* 178*  BUN 14 17 11 8 9   CREATININE 1.35* 1.36* 1.09* 1.06* 1.18*  CALCIUM 9.3 8.3* 8.8* 8.5* 8.4*  MG  --   --  1.2*  --  1.5*  PHOS  --   --  2.0*  --  1.9*   GFR: Estimated Creatinine Clearance: 78.4 mL/min (by C-G formula based on SCr of 1.18 mg/dL (H)). Liver Function Tests:  Recent Labs Lab 04/20/16 1210 04/24/16 0600 04/25/16 0258 04/26/16 0548  AST 32 14* 25 32  ALT 18 13* 17 16  ALKPHOS 109 77 82 83  BILITOT 1.1 0.5 0.7 0.8  PROT 8.1 6.8 8.2* 7.3  ALBUMIN 3.7 3.3* 3.9 3.5    Recent Labs Lab 04/20/16 1210  LIPASE 24   No results for input(s): AMMONIA in the last 168 hours. Coagulation Profile: No results for input(s): INR, PROTIME in the last 168 hours. Cardiac Enzymes:  Recent Labs Lab 04/24/16 0600 04/25/16 0258 04/25/16 0834 04/25/16 1506  TROPONINI <0.03 <0.03 <0.03 <0.03   BNP (last 3 results) No results for input(s): PROBNP in the last 8760 hours. HbA1C: No results for input(s): HGBA1C in the last 72 hours. CBG:  Recent Labs Lab 04/25/16 1707 04/25/16 2153 04/26/16 0719 04/26/16 1149 04/26/16 1735  GLUCAP 144* 135* 184* 160* 256*   Lipid Profile: No results for input(s): CHOL, HDL, LDLCALC, TRIG, CHOLHDL, LDLDIRECT in the last 72 hours. Thyroid Function Tests: No results for input(s): TSH, T4TOTAL, FREET4, T3FREE, THYROIDAB in the last 72 hours. Anemia Panel: No results for input(s): VITAMINB12, FOLATE, FERRITIN, TIBC, IRON, RETICCTPCT in the last 72 hours. Sepsis Labs: No results for input(s): PROCALCITON, LATICACIDVEN in the last 168 hours.  No results found for this or any previous visit (from the past 240 hour(s)).   Radiology Studies: Dg  Chest 2 View  Result Date: 04/25/2016 CLINICAL DATA:  Acute onset of central chest pain. Initial encounter. EXAM: CHEST  2 VIEW COMPARISON:  Chest radiograph performed 04/23/2016 FINDINGS: The lungs are well-aerated. Pulmonary vascularity is at the upper limits of normal. Mild peribronchial thickening is noted. There is no evidence of focal opacification, pleural effusion or pneumothorax. The heart is normal in size; the mediastinal  contour is within normal limits. No acute osseous abnormalities are seen. IMPRESSION: Mild peribronchial thickening noted.  Lungs otherwise grossly clear. Electronically Signed   By: Garald Balding M.D.   On: 04/25/2016 17:10   Scheduled Meds: . amLODipine  10 mg Oral Daily  . DULoxetine  30 mg Oral BID  . enoxaparin (LOVENOX) injection  60 mg Subcutaneous QHS  . famotidine (PEPCID) IV  20 mg Intravenous Q12H  . insulin aspart  0-20 Units Subcutaneous TID WC  . insulin aspart  0-5 Units Subcutaneous QHS  . insulin glargine  40 Units Subcutaneous QHS  . metoCLOPramide (REGLAN) injection  10 mg Intravenous Q8H  . metoprolol succinate  25 mg Oral Daily  . pantoprazole  40 mg Oral BID  . potassium chloride (KCL MULTIRUN) 30 mEq in 265 mL IVPB  30 mEq Intravenous Once  . pravastatin  10 mg Oral Daily   Continuous Infusions: . sodium chloride 100 mL/hr at 04/26/16 1117    LOS: 2 days   Kerney Elbe, DO [facility] Pager [phone]  If 7PM-7AM, please contact night-coverage [email or web address] Password [email or web address] 04/26/2016, 9:00 PM

## 2016-04-26 NOTE — Consult Note (Signed)
[facility] Consult Note  Referring Provider: No ref. provider found Primary Care Physician:  Nolon Bussing, [place] Primary Gastroenterologist:  Dr.  Laurel Dimmer Complaint: Nausea and vomiting HPI: Deborah [name] is an 51 y.o. black female  with an some dependent diabetes since 2000 with gastroparesis apparently treated at [facility]. She describes having had an abnormal gastric MB scan in the past. She's been on Reglan on and off but not on it on admission. She thinks she had An EGD about a year ago at [facility] but we do not have detailed records available. She denies any hematemesis. She denies any significant heartburn. Past Medical History:  Diagnosis Date  . Chest pain 12/2015  . Diabetes mellitus   . Fibromyalgia   . Gastric ulcer   . Gastroparesis   . Hypertension   . Obesity   . Pneumonia   . Stroke [facility]) 02/2011    Past Surgical History:  Procedure Laterality Date  . CATARACT EXTRACTION  01/2014  . CHOLECYSTECTOMY      Medications Prior to Admission  Medication Sig Dispense Refill  . albuterol (PROVENTIL HFA;VENTOLIN HFA) 108 (90 Base) MCG/ACT inhaler Inhale 2 puffs into the lungs every 6 (six) hours as needed for wheezing or shortness of breath. 1 Inhaler 3  . allopurinol (ZYLOPRIM) 100 MG tablet Take 1 tablet (100 mg total) by mouth daily. 30 tablet 0  . amLODipine (NORVASC) 10 MG tablet Take 1 tablet (10 mg total) by mouth daily. 30 tablet 6  . colchicine 0.6 MG tablet Take 0.6 mg by mouth daily as needed.   1  . dicyclomine (BENTYL) 20 MG tablet Take 1 tablet (20 mg total) by mouth 2 (two) times daily. 20 tablet 0  . DULoxetine (CYMBALTA) 30 MG capsule Take 1 capsule (30 mg total) by mouth 2 (two) times daily. 60 capsule 3  . metoCLOPramide (REGLAN) 10 MG tablet Take 1 tablet (10 mg total) by mouth every 6 (six) hours. 30 tablet 0  . metoprolol succinate (TOPROL-XL) 25 MG 24 hr tablet Take 1 tablet (25 mg total) by mouth daily.  30 tablet 0  . nitroGLYCERIN (NITROSTAT) 0.4 MG SL tablet Place 1 tablet (0.4 mg total) under the tongue every 5 (five) minutes as needed for chest pain. 20 tablet 0  . NOVOLOG MIX 70/30 (70-30) 100 UNIT/ML injection Inject 0.5-0.75 mLs (50-75 Units total) into the skin 3 (three) times daily after meals. 75 units every morning, 60 units with lunch, and 50 units with dinner 10 mL 2  . pantoprazole (PROTONIX) 40 MG tablet Take 1 tablet (40 mg total) by mouth 2 (two) times daily. 60 tablet 0  . pravastatin (PRAVACHOL) 10 MG tablet Take 1 tablet (10 mg total) by mouth daily. 30 tablet 1  . ondansetron (ZOFRAN ODT) 4 MG disintegrating tablet Take 1 tablet (4 mg total) by mouth every 8 (eight) hours as needed for nausea or vomiting. 10 tablet 0    Allergies:  Allergies  Allergen Reactions  . Lisinopril Anaphylaxis    Tongue and mouth swelling  . Penicillins Palpitations    Has patient had a PCN reaction causing immediate rash, facial/tongue/throat swelling, SOB or lightheadedness with hypotension: Yes, heart races Has patient had a PCN reaction causing severe rash involving mucus membranes or skin necrosis: No Has patient had a PCN reaction that required hospitalization: Yes  Has patient had a PCN reaction occurring within the last 10 years: No   . Valium [Diazepam] [name]  .  Aspirin Other (See Comments)    Irritates stomach ulcer   . Food Hives and Swelling    Carrots, ketchup   . Nsaids Other (See Comments)    ULCER  . Tylenol [Acetaminophen] Other (See Comments)    Irritates stomach ulcer   . Morphine And Related Palpitations  . Tramadol Nausea And Vomiting    Family History  Problem Relation Age of Onset  . Diabetes Mother   . Diabetes Father   . Heart disease Father   . Diabetes Sister   . Congestive Heart Failure Sister 5  . Diabetes Brother     Social History:  reports that she has never smoked. She has never used smokeless tobacco. She reports that she does  not drink alcohol or use drugs.  Review of Systems: negative except As above   Blood pressure (!) 118/57, pulse 92, temperature 97.5 F (36.4 C), temperature source Oral, resp. rate 18, height _0  (1.676 m), weight 133.5 kg (294 lb 5 oz), last menstrual period 10/10/2012, SpO2 100 %. Head: Normocephalic, without obvious abnormality, atraumatic Neck: no adenopathy, no carotid bruit, no JVD, supple, symmetrical, trachea midline and thyroid not enlarged, symmetric, no tenderness/mass/nodules Resp: clear to auscultation bilaterally Cardio: regular rate and rhythm, S1, S2 normal, no murmur, click, rub or gallop GI: Abdomen soft nondistended with normoactive bowel sounds. No hepatomegaly masses or guarding. Extremities: extremities normal, atraumatic, no cyanosis or edema  Results for orders placed or performed during the hospital encounter of 04/23/16 (from the past 48 hour(s))  Glucose, capillary     Status: Abnormal   Collection Time: 04/24/16  4:51 PM  Result Value Ref Range   Glucose-Capillary 201 (H) 65 - 99 mg/dL   Comment 1 Notify RN   Glucose, capillary     Status: Abnormal   Collection Time: 04/24/16  9:35 PM  Result Value Ref Range   Glucose-Capillary 140 (H) 65 - 99 mg/dL  CBC with Differential/Platelet     Status: Abnormal   Collection Time: 04/25/16  2:58 AM  Result Value Ref Range   WBC 11.2 (H) 4.0 - 10.5 K/uL   RBC 3.85 (L) 3.87 - 5.11 MIL/uL   Hemoglobin 10.8 (L) 12.0 - 15.0 g/dL   HCT 32.8 (L) 36.0 - 46.0 %   MCV 85.2 78.0 - 100.0 fL   MCH 28.1 26.0 - 34.0 pg   MCHC 32.9 30.0 - 36.0 g/dL   RDW 13.9 11.5 - 15.5 %   Platelets 367 150 - 400 K/uL   Neutrophils Relative % 58 %   Neutro Abs 6.5 1.7 - 7.7 K/uL   Lymphocytes Relative 35 %   Lymphs Abs 4.0 0.7 - 4.0 K/uL   Monocytes Relative 5 %   Monocytes Absolute 0.6 0.1 - 1.0 K/uL   Eosinophils Relative 2 %   Eosinophils Absolute 0.2 0.0 - 0.7 K/uL   Basophils Relative 0 %   Basophils Absolute 0.1 0.0 - 0.1 K/uL   Comprehensive metabolic panel     Status: Abnormal   Collection Time: 04/25/16  2:58 AM  Result Value Ref Range   Sodium 140 135 - 145 mmol/L   Potassium 2.7 (LL) 3.5 - 5.1 mmol/L    Comment: CRITICAL RESULT CALLED TO, READ BACK BY AND VERIFIED WITHNelta Numbers RN [id number] 04/25/16 A NAVARRO    Chloride 105 101 - 111 mmol/L   CO2 24 22 - 32 mmol/L   Glucose, Bld 193 (H) 65 - 99 mg/dL   BUN 11 6 -  20 mg/dL   Creatinine, Ser 1.09 (H) 0.44 - 1.00 mg/dL   Calcium 8.8 (L) 8.9 - 10.3 mg/dL   Total Protein 8.2 (H) 6.5 - 8.1 g/dL   Albumin 3.9 3.5 - 5.0 g/dL   AST 25 15 - 41 U/L   ALT 17 14 - 54 U/L   Alkaline Phosphatase 82 38 - 126 U/L   Total Bilirubin 0.7 0.3 - 1.2 mg/dL   GFR calc non Af Amer 57 (L) >60 mL/min   GFR calc Af Amer >60 >60 mL/min    Comment: (NOTE) The eGFR has been calculated using the CKD EPI equation. This calculation has not been validated in all clinical situations. eGFR's persistently <60 mL/min signify possible Chronic Kidney Disease.    Anion gap 11 5 - 15  Magnesium     Status: Abnormal   Collection Time: 04/25/16  2:58 AM  Result Value Ref Range   Magnesium 1.2 (L) 1.7 - 2.4 mg/dL  Phosphorus     Status: Abnormal   Collection Time: 04/25/16  2:58 AM  Result Value Ref Range   Phosphorus 2.0 (L) 2.5 - 4.6 mg/dL  Troponin I (q 6hr x 3)     Status: None   Collection Time: 04/25/16  2:58 AM  Result Value Ref Range   Troponin I <0.03 <0.03 ng/mL  Glucose, capillary     Status: Abnormal   Collection Time: 04/25/16  7:35 AM  Result Value Ref Range   Glucose-Capillary 272 (H) 65 - 99 mg/dL   Comment 1 Notify RN   Troponin I (q 6hr x 3)     Status: None   Collection Time: 04/25/16  8:34 AM  Result Value Ref Range   Troponin I <0.03 <0.03 ng/mL  Basic metabolic panel     Status: Abnormal   Collection Time: 04/25/16  8:34 AM  Result Value Ref Range   Sodium 137 135 - 145 mmol/L   Potassium 2.8 (L) 3.5 - 5.1 mmol/L   Chloride 102 101 - 111 mmol/L   CO2 22  22 - 32 mmol/L   Glucose, Bld 305 (H) 65 - 99 mg/dL   BUN 8 6 - 20 mg/dL   Creatinine, Ser 1.06 (H) 0.44 - 1.00 mg/dL   Calcium 8.5 (L) 8.9 - 10.3 mg/dL   GFR calc non Af Amer 59 (L) >60 mL/min   GFR calc Af Amer >60 >60 mL/min    Comment: (NOTE) The eGFR has been calculated using the CKD EPI equation. This calculation has not been validated in all clinical situations. eGFR's persistently <60 mL/min signify possible Chronic Kidney Disease.    Anion gap 13 5 - 15  Glucose, capillary     Status: Abnormal   Collection Time: 04/25/16 11:58 AM  Result Value Ref Range   Glucose-Capillary 276 (H) 65 - 99 mg/dL   Comment 1 Notify RN   Troponin I (q 6hr x 3)     Status: None   Collection Time: 04/25/16  3:06 PM  Result Value Ref Range   Troponin I <0.03 <0.03 ng/mL  Glucose, capillary     Status: Abnormal   Collection Time: 04/25/16  5:07 PM  Result Value Ref Range   Glucose-Capillary 144 (H) 65 - 99 mg/dL   Comment 1 Notify RN   Glucose, capillary     Status: Abnormal   Collection Time: 04/25/16  9:53 PM  Result Value Ref Range   Glucose-Capillary 135 (H) 65 - 99 mg/dL   Comment 1  Notify RN   CBC with Differential/Platelet     Status: Abnormal   Collection Time: 04/26/16  5:48 AM  Result Value Ref Range   WBC 10.4 4.0 - 10.5 K/uL   RBC 3.75 (L) 3.87 - 5.11 MIL/uL   Hemoglobin 10.2 (L) 12.0 - 15.0 g/dL   HCT 32.4 (L) 36.0 - 46.0 %   MCV 86.4 78.0 - 100.0 fL   MCH 27.2 26.0 - 34.0 pg   MCHC 31.5 30.0 - 36.0 g/dL   RDW 13.9 11.5 - 15.5 %   Platelets 382 150 - 400 K/uL   Neutrophils Relative % 60 %   Neutro Abs 6.2 1.7 - 7.7 K/uL   Lymphocytes Relative 33 %   Lymphs Abs 3.5 0.7 - 4.0 K/uL   Monocytes Relative 6 %   Monocytes Absolute 0.6 0.1 - 1.0 K/uL   Eosinophils Relative 1 %   Eosinophils Absolute 0.1 0.0 - 0.7 K/uL   Basophils Relative 0 %   Basophils Absolute 0.0 0.0 - 0.1 K/uL  Comprehensive metabolic panel     Status: Abnormal   Collection Time: 04/26/16  5:48 AM   Result Value Ref Range   Sodium 138 135 - 145 mmol/L   Potassium 3.1 (L) 3.5 - 5.1 mmol/L   Chloride 105 101 - 111 mmol/L   CO2 23 22 - 32 mmol/L   Glucose, Bld 178 (H) 65 - 99 mg/dL   BUN 9 6 - 20 mg/dL   Creatinine, Ser 1.18 (H) 0.44 - 1.00 mg/dL   Calcium 8.4 (L) 8.9 - 10.3 mg/dL   Total Protein 7.3 6.5 - 8.1 g/dL   Albumin 3.5 3.5 - 5.0 g/dL   AST 32 15 - 41 U/L   ALT 16 14 - 54 U/L   Alkaline Phosphatase 83 38 - 126 U/L   Total Bilirubin 0.8 0.3 - 1.2 mg/dL   GFR calc non Af Amer 52 (L) >60 mL/min   GFR calc Af Amer >60 >60 mL/min    Comment: (NOTE) The eGFR has been calculated using the CKD EPI equation. This calculation has not been validated in all clinical situations. eGFR's persistently <60 mL/min signify possible Chronic Kidney Disease.    Anion gap 10 5 - 15  Magnesium     Status: Abnormal   Collection Time: 04/26/16  5:48 AM  Result Value Ref Range   Magnesium 1.5 (L) 1.7 - 2.4 mg/dL  Phosphorus     Status: Abnormal   Collection Time: 04/26/16  5:48 AM  Result Value Ref Range   Phosphorus 1.9 (L) 2.5 - 4.6 mg/dL  Glucose, capillary     Status: Abnormal   Collection Time: 04/26/16  7:19 AM  Result Value Ref Range   Glucose-Capillary 184 (H) 65 - 99 mg/dL  Glucose, capillary     Status: Abnormal   Collection Time: 04/26/16 11:49 AM  Result Value Ref Range   Glucose-Capillary 160 (H) 65 - 99 mg/dL   Dg Chest 2 View  Result Date: 04/25/2016 CLINICAL DATA:  Acute onset of central chest pain. Initial encounter. EXAM: CHEST  2 VIEW COMPARISON:  Chest radiograph performed 04/23/2016 FINDINGS: The lungs are well-aerated. Pulmonary vascularity is at the upper limits of normal. Mild peribronchial thickening is noted. There is no evidence of focal opacification, pleural effusion or pneumothorax. The heart is normal in size; the mediastinal contour is within normal limits. No acute osseous abnormalities are seen. IMPRESSION: Mild peribronchial thickening noted.  Lungs  otherwise grossly clear. Electronically Signed  By: Garald Balding M.D.   On: 04/25/2016 17:10    Assessment: Presumed diabetic gastroparesis secondary to long-standing insulin-dependent diabetes. Slowly improving with some ability to maintain PO intake today. Plan:  Continue Reglan. Continue PPI. Gastric emptying scan. Consider EGD depending on her results and her improvement. We'll follow with you. Neila Teem C 04/26/2016, 4:11 PM  Pager [phone] If no answer or after 5 PM call [phone]

## 2016-04-26 NOTE — Progress Notes (Signed)
Patient requests to take AM medications in 30 minutes, as her throat is sore. Ginger ale provided. Will return to give meds.

## 2016-04-27 LAB — CBC WITH DIFFERENTIAL/PLATELET
BASOS ABS: 0 10*3/uL (ref 0.0–0.1)
BASOS PCT: 0 %
EOS ABS: 0.2 10*3/uL (ref 0.0–0.7)
Eosinophils Relative: 2 %
HEMATOCRIT: 28.6 % — AB (ref 36.0–46.0)
Hemoglobin: 9.3 g/dL — ABNORMAL LOW (ref 12.0–15.0)
Lymphocytes Relative: 38 %
Lymphs Abs: 3.5 10*3/uL (ref 0.7–4.0)
MCH: 28.8 pg (ref 26.0–34.0)
MCHC: 32.5 g/dL (ref 30.0–36.0)
MCV: 88.5 fL (ref 78.0–100.0)
Monocytes Absolute: 0.5 10*3/uL (ref 0.1–1.0)
Monocytes Relative: 6 %
NEUTROS ABS: 5.1 10*3/uL (ref 1.7–7.7)
NEUTROS PCT: 54 %
Platelets: 323 10*3/uL (ref 150–400)
RBC: 3.23 MIL/uL — ABNORMAL LOW (ref 3.87–5.11)
RDW: 13.9 % (ref 11.5–15.5)
WBC: 9.3 10*3/uL (ref 4.0–10.5)

## 2016-04-27 LAB — GLUCOSE, CAPILLARY
GLUCOSE-CAPILLARY: 184 mg/dL — AB (ref 65–99)
Glucose-Capillary: 219 mg/dL — ABNORMAL HIGH (ref 65–99)
Glucose-Capillary: 200 mg/dL — ABNORMAL HIGH (ref 65–99)
Glucose-Capillary: 265 mg/dL — ABNORMAL HIGH (ref 65–99)

## 2016-04-27 LAB — MAGNESIUM: Magnesium: 1.4 mg/dL — ABNORMAL LOW (ref 1.7–2.4)

## 2016-04-27 LAB — COMPREHENSIVE METABOLIC PANEL
ALBUMIN: 2.9 g/dL — AB (ref 3.5–5.0)
ALT: 18 U/L (ref 14–54)
AST: 26 U/L (ref 15–41)
Alkaline Phosphatase: 82 U/L (ref 38–126)
Anion gap: 6 (ref 5–15)
BILIRUBIN TOTAL: 0.8 mg/dL (ref 0.3–1.2)
BUN: 13 mg/dL (ref 6–20)
CHLORIDE: 108 mmol/L (ref 101–111)
CO2: 24 mmol/L (ref 22–32)
Calcium: 8.5 mg/dL — ABNORMAL LOW (ref 8.9–10.3)
Creatinine, Ser: 0.98 mg/dL (ref 0.44–1.00)
GFR calc Af Amer: >60 mL/min (ref >60)
GFR calc non Af Amer: >60 mL/min (ref >60)
GLUCOSE: 185 mg/dL — AB (ref 65–99)
POTASSIUM: 3.5 mmol/L (ref 3.5–5.1)
SODIUM: 138 mmol/L (ref 135–145)
Total Protein: 6.2 g/dL — ABNORMAL LOW (ref 6.5–8.1)

## 2016-04-27 LAB — PHOSPHORUS: Phosphorus: 2.5 mg/dL (ref 2.5–4.6)

## 2016-04-27 MED ORDER — MAGNESIUM SULFATE 2 GM/50ML IV SOLN
2.0000 g | Freq: Once | INTRAVENOUS | Status: AC
  Administered 2016-04-27: 2 g via INTRAVENOUS
  Filled 2016-04-27: qty 50

## 2016-04-27 NOTE — Progress Notes (Signed)
PROGRESS NOTE    Deborah [name]  SNK:[id number] DOB: 09-13-1965 DOA: 04/23/2016 PCP: Nolon Bussing, PA   Brief Narrative:  Deborah [name] is a 51 y.o. female with a past medical history significant for IDDM and gastroparesis, HTN, CKD III, hx of CVA, hx of PUD no GIB, and gout who presents with vomiting for 5 days. The patient was in her usual state of health until about 5 or 6 days ago when she developed heartburn, diffuse abdominal cramps, and recurrent vomiting with any oral intake-all similar to her previous episodes of gastroparesis. She was seen 3 days ago in the ED, got IV fluids, Zofran, Haldol, cycling, and fentanyl 100 g, and felt improved and was able to go home. However, within a day, she was as bad as she had been before, with constant crampy abdominal discomfort, heartburn that is severe, vomiting with even water crackers, unable to take any of her blood pressure medicines, Reglan, or dicyclomine, and afraid to take her insulin.  Today she began to feel weaker and weaker, so she returned to the Er. Was admitted for Nausea/Vomiting/Abdominal Pain from Gastroparesis and is slowly improving. States Pain in Abdomen was 7/10 in [place] yesterday. Developed Chest Pain overnight and complained of severe pain this AM that she stated she needed something to help. Has not attempted eating at all and only been drinking water. Patient stated she was nauseous and vomiting all night but per nursing report patient was sleeping and did not vomit. Gastroenterology Dr. Amedeo Plenty was consulted for further recommendations.  Assessment & Plan:   Active Problems:   Essential hypertension, benign   Uncontrolled type 2 diabetes mellitus with diabetic neuropathy, with long-term current use of insulin (HCC)   CKD (chronic kidney disease) stage 3, GFR 30-59 ml/min   Nausea and vomiting   Gastroparesis   AKI (acute kidney injury) ([place])  Nausea/Vomiting/Abdominal Pain 2/2 to Gastroparesis from  Diabetes Mellitus, improving -S/p 1 liter bolus; C/w NS at 100 mL/hr -C/w Metoclopramide 10 mg IV q8h -C/w Zofran 4 mg po/IV q6hprn for Nausea -Patient received Carafate 1 gram po Once last Night -Dicyclomine 20 mg po BID D/C'd -C/w Protonix 40 mg po BID; C/w Famotidine 20 mg IV q12h -Received IV Dilaudid 1 mg and Fentanyl 50 mcg Iv; Try to Avoid Narcotics but had to give 1x dose of IV Dilaudid; Restarted Home Oxy IR for severe pain -Advance as Tolerated to Soft Heart Healthy Carb Modified Diet when able; Advanced to Soft Diet and tolerated first meal today without Nausea or vomiting  -Was not able to follow up with Gastroenterology as an outpatient since last hospital Discharge -Consulted Dr[facility] and appreciate evaluation and Recc's; -Per his note Will continue Reglan and PPI and obtain Gastric Emptying study scan; Depending on results will consider EGD; Will defer Gastric Emptying scan to Gastroenterology as patient had discussion about it with Dr. Amedeo Plenty and may want EGD as she may refuse Gastric Emptying Study  Uncontrolled Insulin Dependent Diabetes Mellitus Type 2 complicated by Neuropathy and Gastroparesis -C/w Lantus 40 units sq qHS and with Resistant Novolog SSI ACHS -**Takes Novolog 70/30 as an out patient and takes 75 every morning, 60 units with lunch and 50 units with dinner and has been non-compliant last 3 days because of vomiting -C/w Duloxetine 30 mg po BID -Hemoglobin A1c was 9.7 on 03/01/16 -Appreciate Diabetic Education Coordinator Evaluation -Recommends adding Novolog 4 units TIDwc for Meal Coverage -Will need to Titrate Insulin for Goal of <180 -  CBG running from 160-219  Atypical CP r/o'dACS; Radiated into MidEpigastric Region and worse with Deep Inspiration -Troponin I <0.03 x 4 -EKG showed Sinus Tachycardia but no evidence of ST-Elevation or Depression -C/w Metoprolol Succinate 25 mg po Daily and with Pravastatin 10 mg po  Daily -ECHOCardiogram showed mild LVH with Systolic Fxn normal and estimated EF of 60-65% with no regional wall motion abnormalities and a trivial pericardial effusion -Has not eaten anything and only been drinking water -CXR showed Mild peribronchial thickening noted.  Lungs otherwise grossly clear  Hypokalemia -Patient's K+ was 3.5 -Replete with po 40 mEQ BID -Repeat CMP in AM  Hypomagnesemia -Patient's Mag Level was 1.4 -Replete with IV Mag Sulfate -Repeat Mag Level in AM  Leukocytosis likley Reactive from N/V -Patient's WBC went from 12.3 -> 12.8 -> 11.2 -> 10.4 -> 9.3 -No active S/Sx of Infection -CXR and Negative -Continue to Monitor and Repeat CBC  AKI, improving -Likely Pre-renal in Nature from Vomiting  -Patient's BUN/Cr went from 17/1.36 -> 13/0.98 (baseline Cr was around 0.9-1.1) -C/w IVF Rehydration with NS at 100 mL/hr -Repeat CMP in AM  Dyslipidemia associated withType 2 Diabetes Mellitus -C/w Pravastatin 10 mg po Daily  Essential Hypertension -C/w Amlodipine 10 mg po daily; Metoprolol Succinate 25 mg po Daily   Gout -Allopurinol 100 mg po Daily held currently  Fibromyalgia -C/w Duloxetine 30 mg pog BID  DVT prophylaxis: Lovenox 60 mg sq qHS Code Status: FULL CODE Family Communication: No Family present at bedside Disposition Plan: Home when Stable for D/C pending GI Workup  Consultants:   Gastroenterology Dr. Amedeo Plenty   Procedures: ECHOCARDIOGRAM Study Conclusions  - Left ventricle: The cavity size was normal. Wall thickness was   increased in a pattern of mild LVH. Systolic function was normal.   The estimated ejection fraction was in the range of 60% to 65%.   Wall motion was normal; there were no regional wall motion   abnormalities. - Pericardium, extracardiac: A trivial pericardial effusion was   identified.  Antimicrobials: Anti-infectives    None     Subjective: Seen this AM and states she was feeling better. Awaiting to talk  with GI about Gastric Emptying study. States nausea and vomiting is improved and has mild abdominal Pain. Was concerned about minimial pericardial effusion around her heart. No CP or SOB. No other concerns or complaints.   Objective: Vitals:   04/26/16 1437 04/26/16 2108 04/27/16 0438 04/27/16 1401  BP: (!) 118/57 (!) 162/89 120/60 (!) 143/90  Pulse: 92 99 94 99  Resp: 18 18 20 20   Temp: 97.5 F (36.4 C) 98.4 F (36.9 C) 98.5 F (36.9 C) 98.1 F (36.7 C)  TempSrc: Oral Oral Oral Oral  SpO2: 100% 98% 97% 100%  Weight:      Height:        Intake/Output Summary (Last 24 hours) at 04/27/16 1912 Last data filed at 04/27/16 1737  Gross per 24 hour  Intake              702 ml  Output                0 ml  Net              702 ml   Filed Weights   04/23/16 1435 04/23/16 2330  Weight: (!) 140.6 kg (310 lb) 133.5 kg (294 lb 5 oz)   Examination: Physical Exam:  Constitutional: WN/WD morbidly obese AAF appears calm and comfortable sitting in chair bedside ENMT:  External Ears, Nose appear normal. Grossly normal hearing. Neck: Appears normal, supple, no cervical masses, normal ROM, no appreciable thyromegaly Respiratory: Diminished to auscultation bilaterally, no wheezing, rales, rhonchi or crackles. Normal respiratory effort and patient is not tachypenic. No accessory muscle use.  Cardiovascular: RRR, no murmurs / rubs / gallops. S1 and S2 auscultated. No appreciable extremity edema.  Abdomen: Soft, mildly tender to palpate in mid abdomen, non-distended. No masses palpated. No appreciable hepatosplenomegaly. Bowel sounds positive x4 .  GU: Deferred. Musculoskeletal: No clubbing / cyanosis of digits/nails. No joint deformity upper and lower extremities.  Skin: No rashes, lesions, ulcers on limited. No induration; Warm and dry.  Neurologic: CN 2-12 grossly intact with no focal deficits. Romberg sign cerebellar reflexes not assessed.  Psychiatric: Normal judgment and insight. Alert and  oriented x 3. Normal mood and appropriate affect.   Data Reviewed: I have personally reviewed following labs and imaging studies  CBC:  Recent Labs Lab 04/23/16 1549 04/24/16 0600 04/25/16 0258 04/26/16 0548 04/27/16 0543  WBC 12.3* 12.8* 11.2* 10.4 9.3  NEUTROABS  --   --  6.5 6.2 5.1  HGB 11.7* 9.8* 10.8* 10.2* 9.3*  HCT 35.2* 30.0* 32.8* 32.4* 28.6*  MCV 86.3 86.5 85.2 86.4 88.5  PLT 374 332 367 382 962   Basic Metabolic Panel:  Recent Labs Lab 04/24/16 0600 04/25/16 0258 04/25/16 0834 04/26/16 0548 04/27/16 0543  NA 140 140 137 138 138  K 3.0* 2.7* 2.8* 3.1* 3.5  CL 107 105 102 105 108  CO2 26 24 22 23 24   GLUCOSE 248* 193* 305* 178* 185*  BUN 17 11 8 9 13   CREATININE 1.36* 1.09* 1.06* 1.18* 0.98  CALCIUM 8.3* 8.8* 8.5* 8.4* 8.5*  MG  --  1.2*  --  1.5* 1.4*  PHOS  --  2.0*  --  1.9* 2.5   GFR: Estimated Creatinine Clearance: 94.3 mL/min (by C-G formula based on SCr of 0.98 mg/dL). Liver Function Tests:  Recent Labs Lab 04/24/16 0600 04/25/16 0258 04/26/16 0548 04/27/16 0543  AST 14* 25 32 26  ALT 13* 17 16 18   ALKPHOS 77 82 83 82  BILITOT 0.5 0.7 0.8 0.8  PROT 6.8 8.2* 7.3 6.2*  ALBUMIN 3.3* 3.9 3.5 2.9*   No results for input(s): LIPASE, AMYLASE in the last 168 hours. No results for input(s): AMMONIA in the last 168 hours. Coagulation Profile: No results for input(s): INR, PROTIME in the last 168 hours. Cardiac Enzymes:  Recent Labs Lab 04/24/16 0600 04/25/16 0258 04/25/16 0834 04/25/16 1506  TROPONINI <0.03 <0.03 <0.03 <0.03   BNP (last 3 results) No results for input(s): PROBNP in the last 8760 hours. HbA1C: No results for input(s): HGBA1C in the last 72 hours. CBG:  Recent Labs Lab 04/26/16 1735 04/26/16 2110 04/27/16 0725 04/27/16 1145 04/27/16 1718  GLUCAP 256* 199* 219* 184* 200*   Lipid Profile: No results for input(s): CHOL, HDL, LDLCALC, TRIG, CHOLHDL, LDLDIRECT in the last 72 hours. Thyroid Function Tests: No  results for input(s): TSH, T4TOTAL, FREET4, T3FREE, THYROIDAB in the last 72 hours. Anemia Panel: No results for input(s): VITAMINB12, FOLATE, FERRITIN, TIBC, IRON, RETICCTPCT in the last 72 hours. Sepsis Labs: No results for input(s): PROCALCITON, LATICACIDVEN in the last 168 hours.  No results found for this or any previous visit (from the past 240 hour(s)).   Radiology Studies: No results found. Scheduled Meds: . amLODipine  10 mg Oral Daily  . DULoxetine  30 mg Oral BID  . enoxaparin (LOVENOX)  injection  60 mg Subcutaneous QHS  . famotidine (PEPCID) IV  20 mg Intravenous Q12H  . insulin aspart  0-20 Units Subcutaneous TID WC  . insulin aspart  0-5 Units Subcutaneous QHS  . insulin aspart  4 Units Subcutaneous TID WC  . insulin glargine  40 Units Subcutaneous QHS  . metoCLOPramide (REGLAN) injection  10 mg Intravenous Q8H  . metoprolol succinate  25 mg Oral Daily  . pantoprazole  40 mg Oral BID  . potassium chloride (KCL MULTIRUN) 30 mEq in 265 mL IVPB  30 mEq Intravenous Once  . potassium chloride  40 mEq Oral BID  . pravastatin  10 mg Oral Daily   Continuous Infusions: . sodium chloride 100 mL/hr at 04/26/16 1117    LOS: 3 days   Kerney Elbe, DO [facility] Pager [phone]  If 7PM-7AM, please contact night-coverage [email or web address] Password [email or web address] 04/27/2016, 7:12 PM

## 2016-04-28 LAB — COMPREHENSIVE METABOLIC PANEL
ALBUMIN: 3.1 g/dL — AB (ref 3.5–5.0)
ALK PHOS: 84 U/L (ref 38–126)
ALT: 16 U/L (ref 14–54)
ANION GAP: 4 — AB (ref 5–15)
AST: 19 U/L (ref 15–41)
BUN: 12 mg/dL (ref 6–20)
CALCIUM: 8.6 mg/dL — AB (ref 8.9–10.3)
CO2: 27 mmol/L (ref 22–32)
Chloride: 108 mmol/L (ref 101–111)
Creatinine, Ser: 0.92 mg/dL (ref 0.44–1.00)
GFR calc Af Amer: >60 mL/min (ref >60)
GFR calc non Af Amer: >60 mL/min (ref >60)
GLUCOSE: 210 mg/dL — AB (ref 65–99)
Potassium: 3.3 mmol/L — ABNORMAL LOW (ref 3.5–5.1)
SODIUM: 139 mmol/L (ref 135–145)
Total Bilirubin: 0.5 mg/dL (ref 0.3–1.2)
Total Protein: 6.2 g/dL — ABNORMAL LOW (ref 6.5–8.1)

## 2016-04-28 LAB — PHOSPHORUS: Phosphorus: 2.5 mg/dL (ref 2.5–4.6)

## 2016-04-28 LAB — CBC WITH DIFFERENTIAL/PLATELET
BASOS ABS: 0 10*3/uL (ref 0.0–0.1)
BASOS PCT: 0 %
EOS ABS: 0.2 10*3/uL (ref 0.0–0.7)
Eosinophils Relative: 3 %
HCT: 27.9 % — ABNORMAL LOW (ref 36.0–46.0)
HEMOGLOBIN: 8.8 g/dL — AB (ref 12.0–15.0)
Lymphocytes Relative: 37 %
Lymphs Abs: 3.1 10*3/uL (ref 0.7–4.0)
MCH: 27.2 pg (ref 26.0–34.0)
MCHC: 31.5 g/dL (ref 30.0–36.0)
MCV: 86.4 fL (ref 78.0–100.0)
MONOS PCT: 6 %
Monocytes Absolute: 0.5 10*3/uL (ref 0.1–1.0)
Neutro Abs: 4.5 10*3/uL (ref 1.7–7.7)
Neutrophils Relative %: 54 %
Platelets: 312 10*3/uL (ref 150–400)
RBC: 3.23 MIL/uL — ABNORMAL LOW (ref 3.87–5.11)
RDW: 13.6 % (ref 11.5–15.5)
WBC: 8.3 10*3/uL (ref 4.0–10.5)

## 2016-04-28 LAB — MAGNESIUM: Magnesium: 1.3 mg/dL — ABNORMAL LOW (ref 1.7–2.4)

## 2016-04-28 LAB — GLUCOSE, CAPILLARY
GLUCOSE-CAPILLARY: 193 mg/dL — AB (ref 65–99)
GLUCOSE-CAPILLARY: 245 mg/dL — AB (ref 65–99)
Glucose-Capillary: 170 mg/dL — ABNORMAL HIGH (ref 65–99)
Glucose-Capillary: 203 mg/dL — ABNORMAL HIGH (ref 65–99)

## 2016-04-28 IMAGING — CR DG FOOT COMPLETE 3+V*R*
3 series · 3 of 3 positions shown · non-contrast
Comparison: None.

CLINICAL DATA: Acute onset of right foot and lower leg swelling and
pain. Initial encounter.

EXAM:
RIGHT FOOT COMPLETE - 3+ VIEW

[x foot ap right]
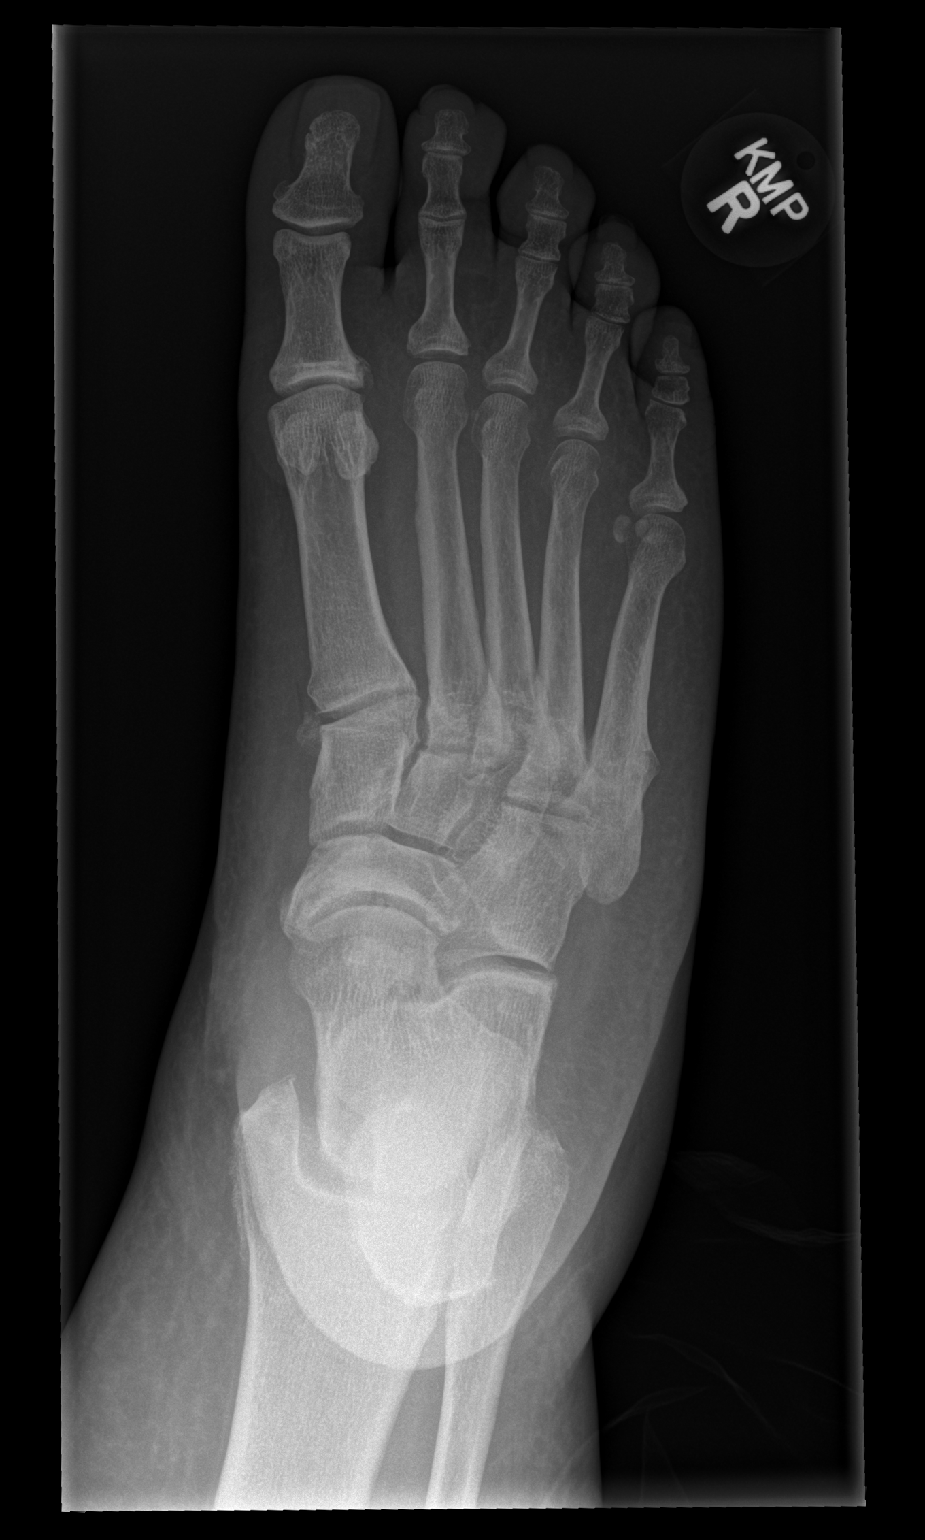

[x foot obl right]
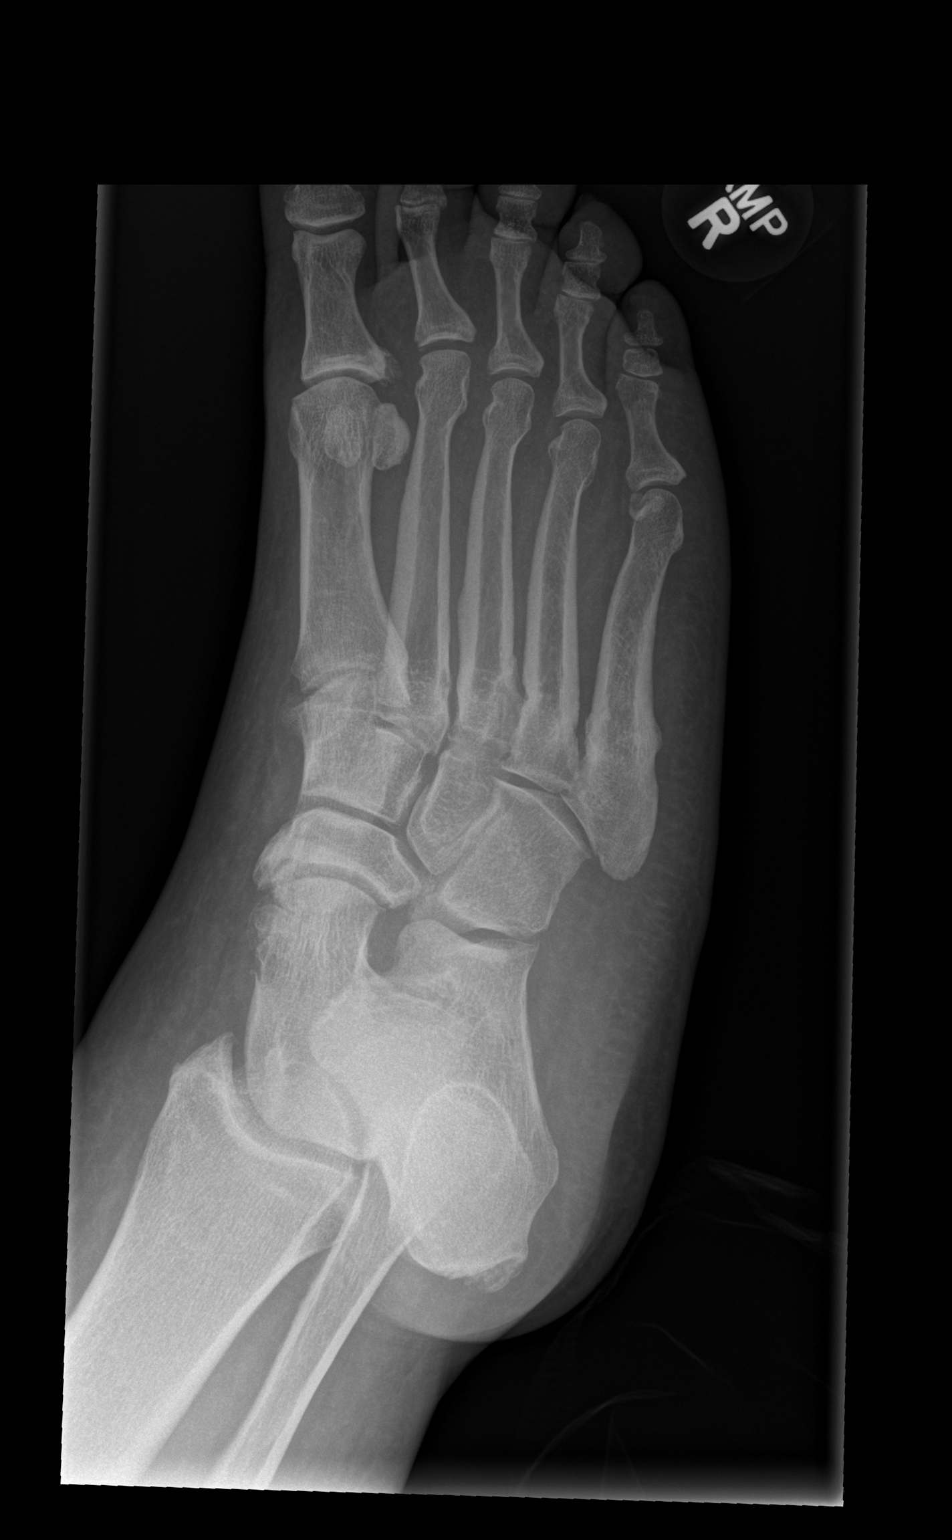

[x foot lat right]
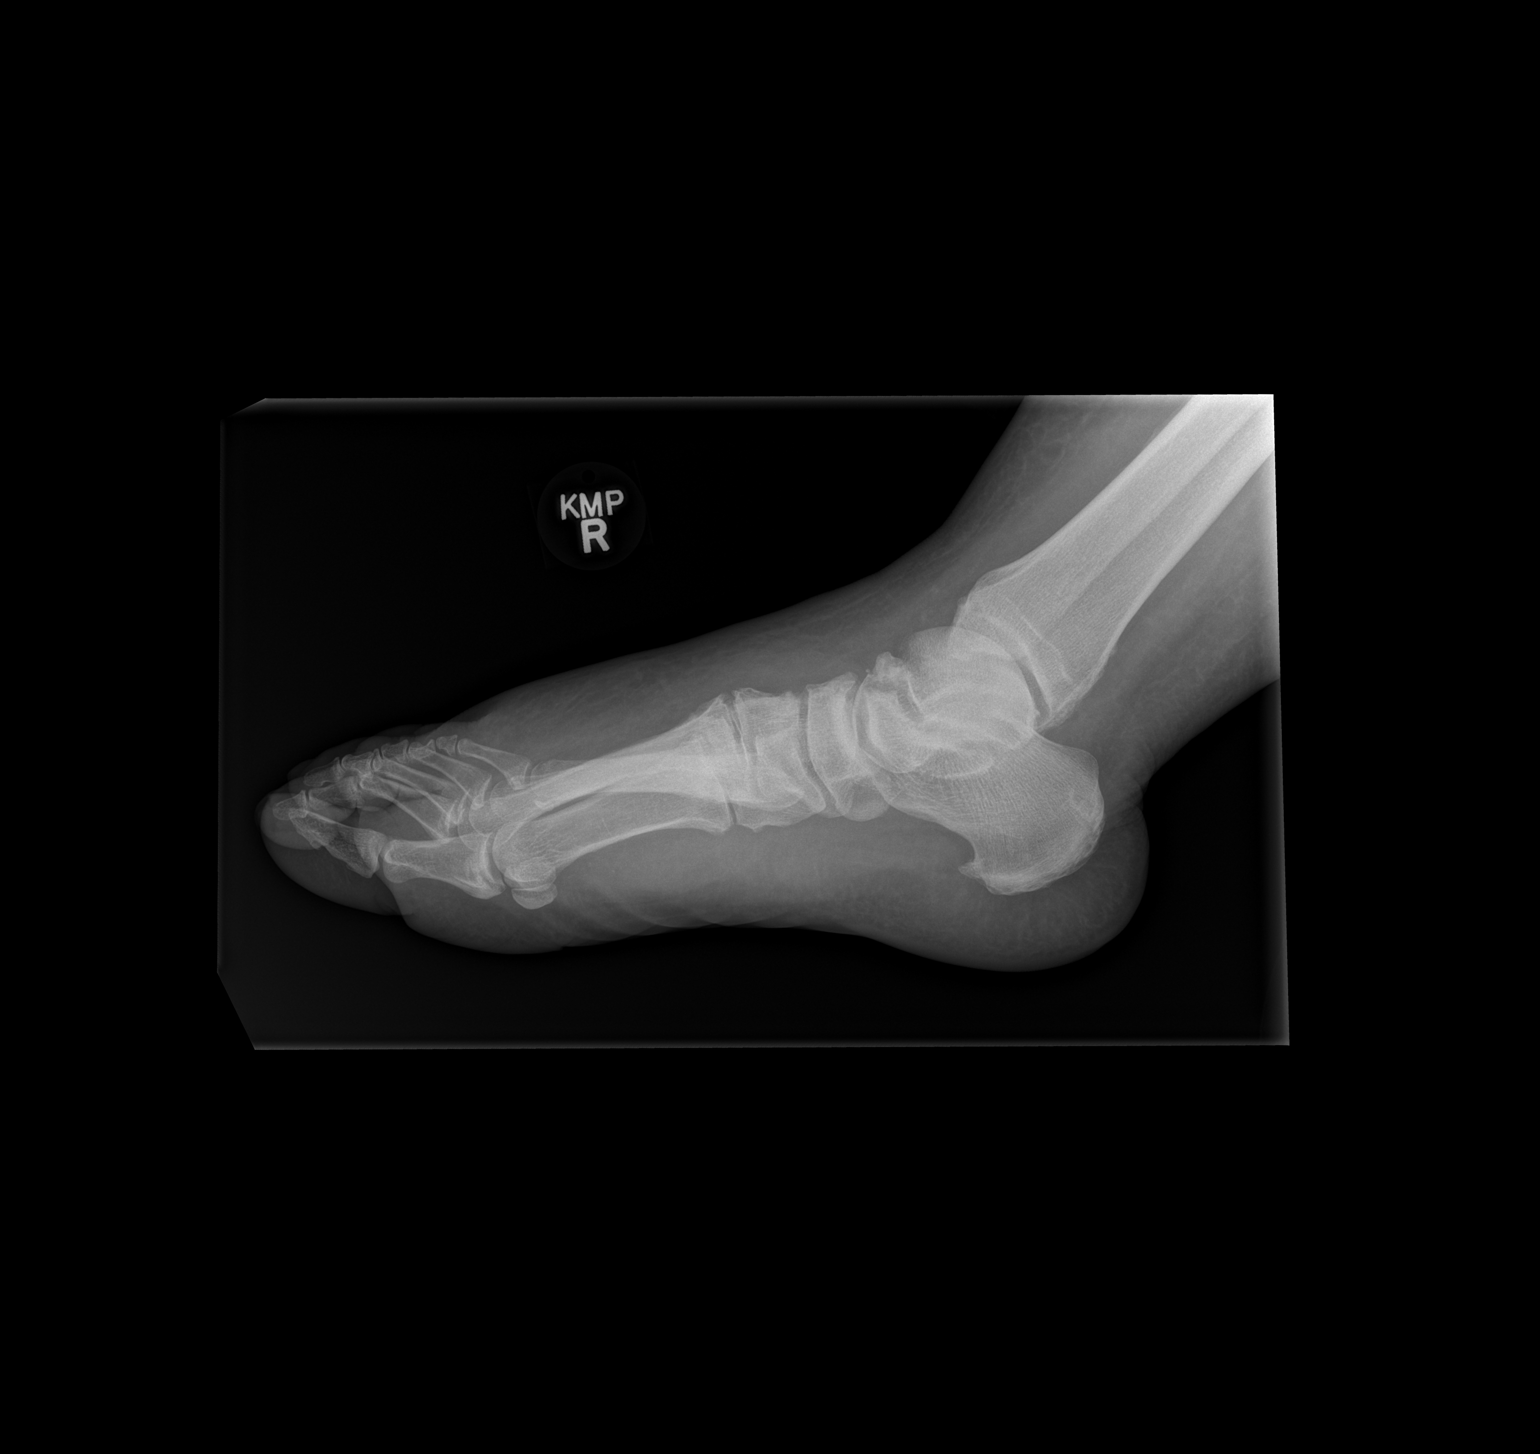

[3 of 3 positions shown; findings below may reference images not displayed]

FINDINGS: There is no evidence of fracture or dislocation. There is chronic
deformity involving the proximal metacarpals. A small osseous
fragment adjacent to the first tarsometatarsal joint is likely
degenerative in nature. Mild degenerative change is seen about the
navicular and anterior talus. The joint spaces are preserved. There
is no evidence of talar subluxation; the subtalar joint is
unremarkable in appearance. A plantar calcaneal spur is seen.

Diffuse soft tissue swelling is noted about the midfoot and
forefoot.
IMPRESSION: 1. No evidence of fracture or dislocation.
2. Mild degenerative change noted about the midfoot.
3. Diffuse soft tissue swelling about the midfoot and forefoot.

## 2016-04-28 MED ORDER — METOCLOPRAMIDE HCL 10 MG PO TABS
10.0000 mg | ORAL_TABLET | Freq: Three times a day (TID) | ORAL | Status: DC
  Administered 2016-04-28 – 2016-04-29 (×3): 10 mg via ORAL
  Filled 2016-04-28 (×3): qty 1

## 2016-04-28 MED ORDER — MAGNESIUM SULFATE 2 GM/50ML IV SOLN
2.0000 g | Freq: Once | INTRAVENOUS | Status: AC
  Administered 2016-04-28: 2 g via INTRAVENOUS
  Filled 2016-04-28: qty 50

## 2016-04-28 NOTE — Progress Notes (Signed)
PROGRESS NOTE    Deborah [name]  UXL:[id number] DOB: Oct 18, 1965 DOA: 04/23/2016 PCP: Nolon Bussing, PA   Brief Narrative:  Deborah [name] is a 51 y.o. female with a past medical history significant for IDDM and gastroparesis, HTN, CKD III, hx of CVA, hx of PUD no GIB, and gout who presents with vomiting for 5 days. The patient was in her usual state of health until about 5 or 6 days ago when she developed heartburn, diffuse abdominal cramps, and recurrent vomiting with any oral intake-all similar to her previous episodes of gastroparesis. She was seen 3 days ago in the ED, got IV fluids, Zofran, Haldol, cycling, and fentanyl 100 g, and felt improved and was able to go home. However, within a day, she was as bad as she had been before, with constant crampy abdominal discomfort, heartburn that is severe, vomiting with even water crackers, unable to take any of her blood pressure medicines, Reglan, or dicyclomine, and afraid to take her insulin.  Today she began to feel weaker and weaker, so she returned to the Er. Was admitted for Nausea/Vomiting/Abdominal Pain from Gastroparesis and is slowly improving. States Pain in Abdomen was 7/10 in [place] yesterday. Developed Chest Pain overnight and complained of severe pain this AM that she stated she needed something to help. Has not attempted eating at all and only been drinking water. Patient stated she was nauseous and vomiting all night but per nursing report patient was sleeping and did not vomit. Gastroenterology Dr. Amedeo Plenty was consulted for further recommendations.  Assessment & Plan:   Active Problems:   Essential hypertension, benign   Uncontrolled type 2 diabetes mellitus with diabetic neuropathy, with long-term current use of insulin (HCC)   CKD (chronic kidney disease) stage 3, GFR 30-59 ml/min   Nausea and vomiting   Gastroparesis   AKI (acute kidney injury) ([place])  Gastroparesis: Severely symptomatic causing dehydration and  AKI, since improving.  - Rehydrated back to normal creatinine and no further vomiting since admission with IV reglan. Now that she's taking po, will change to po reglan - Gastric emptying scan ordered for tomorrow AM per GI. Will continue reglan to evaluate motility while being treated. - Continue Zofran 4 mg po/IV q6hprn for Nausea - Counseled to minimize opioids  -C/w Protonix 40 mg po BID, DC redundant famotidine. - Advance diet as tolerated to carb modified, though will be NPO pMN for study in AM  Uncontrolled Insulin Dependent Diabetes Mellitus Type 2 complicated by Neuropathy and Gastroparesis: Diagnosed in 2000, on insulin since 2007. Hemoglobin A1c was 9.7 on 03/01/16 -C/w Lantus 40 units sq qHS and with Resistant Novolog SSI ACHS +4u TID WC (Takes novolog 70/30 75u qAM, 60u w/lunch, 50u w/dinner at home) -C/w Duloxetine 30 mg po BID -Appreciate Diabetic Coordinator Evaluation - Will need to Titrate Insulin for Goal of <180.   Atypical chest pain: Resolved and ACS ruled out with normal troponins and nonischemic ECG. Echocardiogram showed mild LVH, EF of 60-65% with no regional wall motion abnormalities and a trivial pericardial effusion  Hypokalemia: Mild, presumed due to poor po.  - Monitor s/p repletion.   Hypomagnesemia: Refractory to repletion.  - Repeat magnesium and recheck in AM.  Normocytic anemia: Chronic, trending slowly downward with IVF, perhaps hemodilutional.  - Monitor.   Leukocytosis: Resolved, presumed due to stress demargination from N/V. CXR negative. UA not grossly infected.  -Patient's WBC went from 12.3 -> 12.8 >> 8.3 -No active S/Sx of Infection -CXR and Negative -Continue  to Monitor and Repeat CBC  AKI: Resolved. Creatinine at admission 1.36 back to baseline at 0.92 after IV fluids.  - DC IVF now that taking po and getting trace LE edema  Dyslipidemia associated withType 2 Diabetes Mellitus - C/w Pravastatin 10 mg po Daily  Essential  Hypertension - C/w Amlodipine 10 mg po daily; Metoprolol Succinate 25 mg po Daily   Gout - Allopurinol 100 mg po Daily held currently  Fibromyalgia - C/w Duloxetine 30 mg pog BID  DVT prophylaxis: Lovenox 60 mg sq qHS Code Status: Full code Family Communication: None at bedside this AM Disposition Plan: Home when tolerating po and pending completion of GI work up.  Consultants:   Gastroenterology Dr. Amedeo Plenty   Procedures: ECHOCARDIOGRAM Study Conclusions  - Left ventricle: The cavity size was normal. Wall thickness was   increased in a pattern of mild LVH. Systolic function was normal.   The estimated ejection fraction was in the range of 60% to 65%.   Wall motion was normal; there were no regional wall motion   abnormalities. - Pericardium, extracardiac: A trivial pericardial effusion was   identified.  Antimicrobials: None  Subjective: Patient reports no further episodes of vomiting, tolerating meals, still has waxing/waning abdominal pain.   Objective: Vitals:   04/27/16 1401 04/27/16 2058 04/28/16 0449 04/28/16 1417  BP: (!) 143/90 134/78 133/86 122/67  Pulse: 99 94 93 91  Resp: 20 18 18 18   Temp: 98.1 F (36.7 C) 98.6 F (37 C) 98.8 F (37.1 C) 98.1 F (36.7 C)  TempSrc: Oral Oral Oral Oral  SpO2: 100% 100% 100% 100%  Weight:      Height:        Intake/Output Summary (Last 24 hours) at 04/28/16 1710 Last data filed at 04/28/16 1645  Gross per 24 hour  Intake             3346 ml  Output                0 ml  Net             3346 ml   Filed Weights   04/23/16 1435 04/23/16 2330  Weight: (!) 140.6 kg (310 lb) 133.5 kg (294 lb 5 oz)   Constitutional: Pleasant obese female in no distress ENMT: External Ears, Nose appear normal. Grossly normal hearing. Neck: Appears normal, supple, no cervical masses, normal ROM, no appreciable thyromegaly Respiratory: Normal effort, distant but clear breath sounds Cardiovascular: RRR, no murmurs / rubs / gallops. S1  and S2 auscultated. Trace dependent lower extremity edema.  Abdomen: Soft, mildly tender to palpate across lower abdomen, non-distended. No masses palpated. No appreciable hepatosplenomegaly. Bowel sounds positive x4 .  Musculoskeletal: No clubbing / cyanosis of digits/nails. No joint deformity upper and lower extremities.  Skin: No rashes, lesions, ulcers on limited. No induration; Warm and dry.  Neurologic: CN 2-12 grossly intact with no focal deficits. Romberg sign cerebellar reflexes not assessed.  Psychiatric: Normal judgment and insight. Alert and oriented x 3. Normal mood and appropriate affect.   Data Reviewed: I have personally reviewed following labs and imaging studies  CBC:  Recent Labs Lab 04/24/16 0600 04/25/16 0258 04/26/16 0548 04/27/16 0543 04/28/16 0608  WBC 12.8* 11.2* 10.4 9.3 8.3  NEUTROABS  --  6.5 6.2 5.1 4.5  HGB 9.8* 10.8* 10.2* 9.3* 8.8*  HCT 30.0* 32.8* 32.4* 28.6* 27.9*  MCV 86.5 85.2 86.4 88.5 86.4  PLT 332 367 382 323 628   Basic Metabolic Panel:  Recent Labs Lab 04/25/16 0258 04/25/16 0834 04/26/16 0548 04/27/16 0543 04/28/16 0608  NA 140 137 138 138 139  K 2.7* 2.8* 3.1* 3.5 3.3*  CL 105 102 105 108 108  CO2 24 22 23 24 27   GLUCOSE 193* 305* 178* 185* 210*  BUN 11 8 9 13 12   CREATININE 1.09* 1.06* 1.18* 0.98 0.92  CALCIUM 8.8* 8.5* 8.4* 8.5* 8.6*  MG 1.2*  --  1.5* 1.4* 1.3*  PHOS 2.0*  --  1.9* 2.5 2.5   GFR: Estimated Creatinine Clearance: 100.5 mL/min (by C-G formula based on SCr of 0.92 mg/dL). Liver Function Tests:  Recent Labs Lab 04/24/16 0600 04/25/16 0258 04/26/16 0548 04/27/16 0543 04/28/16 0608  AST 14* 25 32 26 19  ALT 13* 17 16 18 16   ALKPHOS 77 82 83 82 84  BILITOT 0.5 0.7 0.8 0.8 0.5  PROT 6.8 8.2* 7.3 6.2* 6.2*  ALBUMIN 3.3* 3.9 3.5 2.9* 3.1*   Cardiac Enzymes:  Recent Labs Lab 04/24/16 0600 04/25/16 0258 04/25/16 0834 04/25/16 1506  TROPONINI <0.03 <0.03 <0.03 <0.03   CBG:  Recent Labs Lab  04/27/16 1718 04/27/16 2102 04/28/16 0728 04/28/16 1125 04/28/16 1704  GLUCAP 200* 265* 193* 170* 203*   Radiology Studies: No results found. Scheduled Meds: . amLODipine  10 mg Oral Daily  . DULoxetine  30 mg Oral BID  . enoxaparin (LOVENOX) injection  60 mg Subcutaneous QHS  . famotidine (PEPCID) IV  20 mg Intravenous Q12H  . insulin aspart  0-20 Units Subcutaneous TID WC  . insulin aspart  0-5 Units Subcutaneous QHS  . insulin aspart  4 Units Subcutaneous TID WC  . insulin glargine  40 Units Subcutaneous QHS  . metoCLOPramide (REGLAN) injection  10 mg Intravenous Q8H  . metoprolol succinate  25 mg Oral Daily  . pantoprazole  40 mg Oral BID  . potassium chloride  40 mEq Oral BID  . pravastatin  10 mg Oral Daily   Continuous Infusions: . sodium chloride 100 mL/hr at 04/28/16 1600    LOS: 4 days   Vance Gather, MD [facility] Pager [phone]   If 7PM-7AM, please contact night-coverage [email or web address] Password [email or web address] 04/28/2016, 5:10 PM

## 2016-04-28 NOTE — Progress Notes (Signed)
[facility] Progress Note  Subjective: Patient complaining of lower abdominal pain, tolerating diet, no nausea or vomiting since admitted to the floor.  Objective: Vital signs in last 24 hours: Temp:  [98.1 F (36.7 C)-98.8 F (37.1 C)] 98.8 F (37.1 C) (02/21 0449) Pulse Rate:  [93-99] 93 (02/21 0449) Resp:  [18-20] 18 (02/21 0449) BP: (133-143)/(78-90) 133/86 (02/21 0449) SpO2:  [100 %] 100 % (02/21 0449) Weight change:    PE: Unchanged  Lab Results: Results for orders placed or performed during the hospital encounter of 04/23/16 (from the past 24 hour(s))  Glucose, capillary     Status: Abnormal   Collection Time: 04/27/16  5:18 PM  Result Value Ref Range   Glucose-Capillary 200 (H) 65 - 99 mg/dL  Glucose, capillary     Status: Abnormal   Collection Time: 04/27/16  9:02 PM  Result Value Ref Range   Glucose-Capillary 265 (H) 65 - 99 mg/dL  CBC with Differential/Platelet     Status: Abnormal   Collection Time: 04/28/16  6:08 AM  Result Value Ref Range   WBC 8.3 4.0 - 10.5 K/uL   RBC 3.23 (L) 3.87 - 5.11 MIL/uL   Hemoglobin 8.8 (L) 12.0 - 15.0 g/dL   HCT 27.9 (L) 36.0 - 46.0 %   MCV 86.4 78.0 - 100.0 fL   MCH 27.2 26.0 - 34.0 pg   MCHC 31.5 30.0 - 36.0 g/dL   RDW 13.6 11.5 - 15.5 %   Platelets 312 150 - 400 K/uL   Neutrophils Relative % 54 %   Neutro Abs 4.5 1.7 - 7.7 K/uL   Lymphocytes Relative 37 %   Lymphs Abs 3.1 0.7 - 4.0 K/uL   Monocytes Relative 6 %   Monocytes Absolute 0.5 0.1 - 1.0 K/uL   Eosinophils Relative 3 %   Eosinophils Absolute 0.2 0.0 - 0.7 K/uL   Basophils Relative 0 %   Basophils Absolute 0.0 0.0 - 0.1 K/uL  Comprehensive metabolic panel     Status: Abnormal   Collection Time: 04/28/16  6:08 AM  Result Value Ref Range   Sodium 139 135 - 145 mmol/L   Potassium 3.3 (L) 3.5 - 5.1 mmol/L   Chloride 108 101 - 111 mmol/L   CO2 27 22 - 32 mmol/L   Glucose, Bld 210 (H) 65 - 99 mg/dL   BUN 12 6 - 20 mg/dL   Creatinine, Ser 0.92 0.44 -  1.00 mg/dL   Calcium 8.6 (L) 8.9 - 10.3 mg/dL   Total Protein 6.2 (L) 6.5 - 8.1 g/dL   Albumin 3.1 (L) 3.5 - 5.0 g/dL   AST 19 15 - 41 U/L   ALT 16 14 - 54 U/L   Alkaline Phosphatase 84 38 - 126 U/L   Total Bilirubin 0.5 0.3 - 1.2 mg/dL   GFR calc non Af Amer >60 >60 mL/min   GFR calc Af Amer >60 >60 mL/min   Anion gap 4 (L) 5 - 15  Magnesium     Status: Abnormal   Collection Time: 04/28/16  6:08 AM  Result Value Ref Range   Magnesium 1.3 (L) 1.7 - 2.4 mg/dL  Phosphorus     Status: None   Collection Time: 04/28/16  6:08 AM  Result Value Ref Range   Phosphorus 2.5 2.5 - 4.6 mg/dL  Glucose, capillary     Status: Abnormal   Collection Time: 04/28/16  7:28 AM  Result Value Ref Range   Glucose-Capillary 193 (H) 65 - 99 mg/dL  Glucose, capillary     Status: Abnormal   Collection Time: 04/28/16 11:25 AM  Result Value Ref Range   Glucose-Capillary 170 (H) 65 - 99 mg/dL    Studies/Results: No results found.    Assessment: Diabetic gastroparesis, on Reglan. Nuclear medicine gastric emptying scan not done per some reason. No vomiting since admission.  Plan: Continue Reglan, consider switching to oral. Gastric empty scan.    Nathanie Ottley C 04/28/2016, 12:08 PM  Pager [phone] If no answer or after 5 PM call [phone]

## 2016-04-28 NOTE — ED Provider Notes (Signed)
[facility] DEPT Provider Note   [name]: [id number] Arrival date & time: 04/23/16  1409     History   Chief Complaint Chief Complaint  Patient presents with  . Chest Pain  . Abdominal Pain  . Emesis    HPI BRIZEYDA HOLTMEYER is a 51 y.o. female.  HPI  51 y.o.femalewith medical history significant of Diabetes Mellitus Type 2 poorlycontrolled with recurrent DKA, Gastroparesis, Fibromyalgia,Recurrent chest pain, Stroke, Obesity, CKD Stage 3, HTN and other comorbidities who presented to [facility] with nausea and emesis. PT has had nausea and emesis for several days. Pt reports that she has abd pain, generalized, but worse over the epigastrium and she has burning type pain with it.  Pt was seen in the ER 2 days ago. CT PE was ordered and was neg. She had n/v last visit as well.  Past Medical History:  Diagnosis Date  . Chest pain 12/2015  . Diabetes mellitus   . Fibromyalgia   . Gastric ulcer   . Gastroparesis   . Hypertension   . Obesity   . Pneumonia   . Stroke [facility]) 02/2011    Patient Active Problem List   Diagnosis Date Noted  . AKI (acute kidney injury) ([place]) 12/06/2015  . Chest pain 09/26/2015  . Gastroparesis 09/21/2015  . Nausea and vomiting 08/20/2015  . Gout flare 05/27/2015  . Abdominal pain 05/26/2015  . DKA (diabetic ketoacidoses) ([place]) 05/25/2015  . Uncontrolled type 2 diabetes mellitus with diabetic neuropathy, with long-term current use of insulin ([place]) 05/25/2015  . Dyslipidemia associated with type 2 diabetes mellitus ([place]) 05/25/2015  . CKD (chronic kidney disease) stage 3, GFR 30-59 ml/min 05/25/2015  . Essential hypertension, benign 09/28/2013    Past Surgical History:  Procedure Laterality Date  . CATARACT EXTRACTION  01/2014  . CHOLECYSTECTOMY      OB History    No data available       Home Medications    Prior to Admission medications   Medication Sig Start Date End Date Taking? Authorizing Provider  albuterol (PROVENTIL  HFA;VENTOLIN HFA) 108 (90 Base) MCG/ACT inhaler Inhale 2 puffs into the lungs every 6 (six) hours as needed for wheezing or shortness of breath. 09/23/15  Yes Debbe Odea, MD  allopurinol (ZYLOPRIM) 100 MG tablet Take 1 tablet (100 mg total) by mouth daily. 09/23/15  Yes Debbe Odea, MD  amLODipine (NORVASC) 10 MG tablet Take 1 tablet (10 mg total) by mouth daily. 09/23/15  Yes Debbe Odea, MD  colchicine 0.6 MG tablet Take 0.6 mg by mouth daily as needed.  01/31/16  Yes Historical Provider, MD  dicyclomine (BENTYL) 20 MG tablet Take 1 tablet (20 mg total) by mouth 2 (two) times daily. 04/20/16  Yes Waynetta Pean, PA-C  DULoxetine (CYMBALTA) 30 MG capsule Take 1 capsule (30 mg total) by mouth 2 (two) times daily. 09/23/15  Yes Debbe Odea, MD  metoCLOPramide (REGLAN) 10 MG tablet Take 1 tablet (10 mg total) by mouth every 6 (six) hours. 01/14/16  Yes Gareth Morgan, MD  metoprolol succinate (TOPROL-XL) 25 MG 24 hr tablet Take 1 tablet (25 mg total) by mouth daily. 09/23/15  Yes Debbe Odea, MD  nitroGLYCERIN (NITROSTAT) 0.4 MG SL tablet Place 1 tablet (0.4 mg total) under the tongue every 5 (five) minutes as needed for chest pain. 12/20/15  Yes Mauricio Gerome Apley, MD  NOVOLOG MIX 70/30 (70-30) 100 UNIT/ML injection Inject 0.5-0.75 mLs (50-75 Units total) into the skin 3 (three) times daily after meals. 75 units every  morning, 60 units with lunch, and 50 units with dinner 09/23/15  Yes Debbe Odea, MD  pantoprazole (PROTONIX) 40 MG tablet Take 1 tablet (40 mg total) by mouth 2 (two) times daily. 09/23/15  Yes Debbe Odea, MD  pravastatin (PRAVACHOL) 10 MG tablet Take 1 tablet (10 mg total) by mouth daily. 09/23/15  Yes Debbe Odea, MD  ondansetron (ZOFRAN ODT) 4 MG disintegrating tablet Take 1 tablet (4 mg total) by mouth every 8 (eight) hours as needed for nausea or vomiting. 04/20/16   Waynetta Pean, PA-C    Family History Family History  Problem Relation Age of Onset  . Diabetes Mother   .  Diabetes Father   . Heart disease Father   . Diabetes Sister   . Congestive Heart Failure Sister 12  . Diabetes Brother     Social History Social History  Substance Use Topics  . Smoking status: Never Smoker  . Smokeless tobacco: Never Used  . Alcohol use No     Allergies   Lisinopril; Penicillins; Valium [diazepam]; Aspirin; Food; Nsaids; Tylenol [acetaminophen]; Morphine and related; and Tramadol   Review of Systems Review of Systems  ROS 10 Systems reviewed and are negative for acute change except as noted in the HPI.     Physical Exam Updated Vital Signs BP 122/67 (BP Location: Left Arm)   Pulse 91   Temp 98.1 F (36.7 C) (Oral)   Resp 18   Ht 5\' 6"  (1.676 m)   Wt 294 lb 5 oz (133.5 kg)   LMP 10/10/2012   SpO2 100%   BMI 47.50 kg/m   Physical Exam  Constitutional: She is oriented to person, place, and time. She appears well-developed and well-nourished.  HENT:  Head: Normocephalic and atraumatic.  Dry mucosa  Eyes: EOM are normal. Pupils are equal, round, and reactive to light.  Neck: Neck supple.  Cardiovascular: Regular rhythm and normal heart sounds.   Pulmonary/Chest: Effort normal. No respiratory distress.  Abdominal: Soft. She exhibits no distension. There is tenderness. There is guarding. There is no rebound.  Neurological: She is alert and oriented to person, place, and time.  Skin: Skin is warm and dry.  Nursing note and vitals reviewed.    ED Treatments / Results  Labs (all labs ordered are listed, but only abnormal results are displayed) Labs Reviewed  BASIC METABOLIC PANEL - Abnormal; Notable for the following:       Result Value   Glucose, Bld 446 (*)    Creatinine, Ser 1.35 (*)    GFR calc non Af Amer 44 (*)    GFR calc Af Amer 51 (*)    All other components within normal limits  CBC - Abnormal; Notable for the following:    WBC 12.3 (*)    Hemoglobin 11.7 (*)    HCT 35.2 (*)    All other components within normal limits    COMPREHENSIVE METABOLIC PANEL - Abnormal; Notable for the following:    Potassium 3.0 (*)    Glucose, Bld 248 (*)    Creatinine, Ser 1.36 (*)    Calcium 8.3 (*)    Albumin 3.3 (*)    AST 14 (*)    ALT 13 (*)    GFR calc non Af Amer 44 (*)    GFR calc Af Amer 51 (*)    All other components within normal limits  CBC - Abnormal; Notable for the following:    WBC 12.8 (*)    RBC 3.47 (*)  Hemoglobin 9.8 (*)    HCT 30.0 (*)    All other components within normal limits  GLUCOSE, CAPILLARY - Abnormal; Notable for the following:    Glucose-Capillary 360 (*)    All other components within normal limits  GLUCOSE, CAPILLARY - Abnormal; Notable for the following:    Glucose-Capillary 232 (*)    All other components within normal limits  GLUCOSE, CAPILLARY - Abnormal; Notable for the following:    Glucose-Capillary 167 (*)    All other components within normal limits  GLUCOSE, CAPILLARY - Abnormal; Notable for the following:    Glucose-Capillary 201 (*)    All other components within normal limits  CBC WITH DIFFERENTIAL/PLATELET - Abnormal; Notable for the following:    WBC 11.2 (*)    RBC 3.85 (*)    Hemoglobin 10.8 (*)    HCT 32.8 (*)    All other components within normal limits  COMPREHENSIVE METABOLIC PANEL - Abnormal; Notable for the following:    Potassium 2.7 (*)    Glucose, Bld 193 (*)    Creatinine, Ser 1.09 (*)    Calcium 8.8 (*)    Total Protein 8.2 (*)    GFR calc non Af Amer 57 (*)    All other components within normal limits  MAGNESIUM - Abnormal; Notable for the following:    Magnesium 1.2 (*)    All other components within normal limits  PHOSPHORUS - Abnormal; Notable for the following:    Phosphorus 2.0 (*)    All other components within normal limits  GLUCOSE, CAPILLARY - Abnormal; Notable for the following:    Glucose-Capillary 140 (*)    All other components within normal limits  BASIC METABOLIC PANEL - Abnormal; Notable for the following:    Potassium  2.8 (*)    Glucose, Bld 305 (*)    Creatinine, Ser 1.06 (*)    Calcium 8.5 (*)    GFR calc non Af Amer 59 (*)    All other components within normal limits  GLUCOSE, CAPILLARY - Abnormal; Notable for the following:    Glucose-Capillary 272 (*)    All other components within normal limits  GLUCOSE, CAPILLARY - Abnormal; Notable for the following:    Glucose-Capillary 276 (*)    All other components within normal limits  CBC WITH DIFFERENTIAL/PLATELET - Abnormal; Notable for the following:    RBC 3.75 (*)    Hemoglobin 10.2 (*)    HCT 32.4 (*)    All other components within normal limits  COMPREHENSIVE METABOLIC PANEL - Abnormal; Notable for the following:    Potassium 3.1 (*)    Glucose, Bld 178 (*)    Creatinine, Ser 1.18 (*)    Calcium 8.4 (*)    GFR calc non Af Amer 52 (*)    All other components within normal limits  MAGNESIUM - Abnormal; Notable for the following:    Magnesium 1.5 (*)    All other components within normal limits  PHOSPHORUS - Abnormal; Notable for the following:    Phosphorus 1.9 (*)    All other components within normal limits  GLUCOSE, CAPILLARY - Abnormal; Notable for the following:    Glucose-Capillary 144 (*)    All other components within normal limits  GLUCOSE, CAPILLARY - Abnormal; Notable for the following:    Glucose-Capillary 135 (*)    All other components within normal limits  GLUCOSE, CAPILLARY - Abnormal; Notable for the following:    Glucose-Capillary 184 (*)    All other components  within normal limits  GLUCOSE, CAPILLARY - Abnormal; Notable for the following:    Glucose-Capillary 160 (*)    All other components within normal limits  GLUCOSE, CAPILLARY - Abnormal; Notable for the following:    Glucose-Capillary 256 (*)    All other components within normal limits  CBC WITH DIFFERENTIAL/PLATELET - Abnormal; Notable for the following:    RBC 3.23 (*)    Hemoglobin 9.3 (*)    HCT 28.6 (*)    All other components within normal limits    COMPREHENSIVE METABOLIC PANEL - Abnormal; Notable for the following:    Glucose, Bld 185 (*)    Calcium 8.5 (*)    Total Protein 6.2 (*)    Albumin 2.9 (*)    All other components within normal limits  MAGNESIUM - Abnormal; Notable for the following:    Magnesium 1.4 (*)    All other components within normal limits  GLUCOSE, CAPILLARY - Abnormal; Notable for the following:    Glucose-Capillary 199 (*)    All other components within normal limits  GLUCOSE, CAPILLARY - Abnormal; Notable for the following:    Glucose-Capillary 219 (*)    All other components within normal limits  GLUCOSE, CAPILLARY - Abnormal; Notable for the following:    Glucose-Capillary 184 (*)    All other components within normal limits  GLUCOSE, CAPILLARY - Abnormal; Notable for the following:    Glucose-Capillary 200 (*)    All other components within normal limits  CBC WITH DIFFERENTIAL/PLATELET - Abnormal; Notable for the following:    RBC 3.23 (*)    Hemoglobin 8.8 (*)    HCT 27.9 (*)    All other components within normal limits  COMPREHENSIVE METABOLIC PANEL - Abnormal; Notable for the following:    Potassium 3.3 (*)    Glucose, Bld 210 (*)    Calcium 8.6 (*)    Total Protein 6.2 (*)    Albumin 3.1 (*)    Anion gap 4 (*)    All other components within normal limits  MAGNESIUM - Abnormal; Notable for the following:    Magnesium 1.3 (*)    All other components within normal limits  GLUCOSE, CAPILLARY - Abnormal; Notable for the following:    Glucose-Capillary 265 (*)    All other components within normal limits  GLUCOSE, CAPILLARY - Abnormal; Notable for the following:    Glucose-Capillary 193 (*)    All other components within normal limits  GLUCOSE, CAPILLARY - Abnormal; Notable for the following:    Glucose-Capillary 170 (*)    All other components within normal limits  CBG MONITORING, ED - Abnormal; Notable for the following:    Glucose-Capillary 376 (*)    All other components within  normal limits  HIV ANTIBODY (ROUTINE TESTING)  TROPONIN I  TROPONIN I  TROPONIN I  TROPONIN I  PHOSPHORUS  PHOSPHORUS  I-STAT TROPOININ, ED    EKG  EKG Interpretation  Date/Time:  Friday April 23 2016 18:37:57 EST Ventricular Rate:  121 PR Interval:    QRS Duration: 77 QT Interval:  336 QTC Calculation: 477 R Axis:   -38 Text Interpretation:  Sinus tachycardia Left axis deviation No significant change since last tracing Confirmed by RAY MD, Andee Poles ([id number]) on 04/25/2016 8:54:44 AM       Radiology No results found.  Procedures Procedures (including critical care time)  Medications Ordered in ED Medications  insulin glargine (LANTUS) injection 40 Units (40 Units Subcutaneous Given 04/27/16 2200)  insulin aspart (novoLOG) injection  0-20 Units (4 Units Subcutaneous Given 04/28/16 1154)  insulin aspart (novoLOG) injection 0-5 Units (3 Units Subcutaneous Given 04/27/16 2200)  enoxaparin (LOVENOX) injection 60 mg (60 mg Subcutaneous Given 04/27/16 2159)  0.9 %  sodium chloride infusion ( Intravenous New Bag/Given 04/28/16 0823)  ondansetron (ZOFRAN) tablet 4 mg (4 mg Oral Given 04/27/16 0956)    Or  ondansetron (ZOFRAN) injection 4 mg ( Intravenous See Alternative 04/27/16 0956)  metoCLOPramide (REGLAN) injection 10 mg (10 mg Intravenous Given 04/28/16 1455)  famotidine (PEPCID) IVPB 20 mg premix (20 mg Intravenous Given 04/28/16 0956)  diphenhydrAMINE (BENADRYL) capsule 50 mg (50 mg Oral Given 04/24/16 2104)  amLODipine (NORVASC) tablet 10 mg (10 mg Oral Given 04/28/16 0953)  DULoxetine (CYMBALTA) DR capsule 30 mg (30 mg Oral Given 04/28/16 0953)  pravastatin (PRAVACHOL) tablet 10 mg (10 mg Oral Given 04/28/16 0953)  pantoprazole (PROTONIX) EC tablet 40 mg (40 mg Oral Given 04/28/16 0954)  metoprolol succinate (TOPROL-XL) 24 hr tablet 25 mg (25 mg Oral Given 04/28/16 0954)  oxyCODONE (Oxy IR/ROXICODONE) immediate release tablet 5 mg (5 mg Oral Given 04/27/16 2159)  insulin aspart  (novoLOG) injection 4 Units (4 Units Subcutaneous Given 04/28/16 1153)  potassium chloride SA (K-DUR,KLOR-CON) CR tablet 40 mEq (40 mEq Oral Given 04/28/16 0953)  fentaNYL (SUBLIMAZE) injection 50 mcg (50 mcg Intravenous Given 04/23/16 1947)  ondansetron (ZOFRAN) injection 4 mg (4 mg Intravenous Given 04/23/16 1947)  metoCLOPramide (REGLAN) injection 10 mg (10 mg Intravenous Given 04/23/16 2105)  sucralfate (CARAFATE) tablet 1 g (1 g Oral Given 04/23/16 2105)  sodium chloride 0.9 % bolus 1,000 mL (0 mLs Intravenous Stopped 04/23/16 2205)  HYDROmorphone (DILAUDID) injection 1 mg (1 mg Intravenous Given 04/23/16 2105)  insulin aspart (novoLOG) injection 6 Units (6 Units Intravenous Given 04/23/16 2106)  haloperidol lactate (HALDOL) injection 5 mg (5 mg Intravenous Given 04/23/16 2105)  potassium chloride 30 mEq in sodium chloride 0.9 % 265 mL (KCL MULTIRUN) IVPB (30 mEq Intravenous Given 04/25/16 2159)  magnesium sulfate IVPB 2 g 50 mL (2 g Intravenous Given 04/25/16 0917)  HYDROmorphone (DILAUDID) injection 1 mg (1 mg Intravenous Given 04/25/16 1100)  potassium chloride SA (K-DUR,KLOR-CON) CR tablet 40 mEq (40 mEq Oral Given 04/25/16 1725)  magnesium sulfate IVPB 2 g 50 mL (2 g Intravenous Given 04/27/16 2027)  magnesium sulfate IVPB 2 g 50 mL (2 g Intravenous Given 04/28/16 1123)     Initial Impression / Assessment and Plan / ED Course  I have reviewed the triage vital signs and the nursing notes.  Pertinent labs & imaging results that were available during my care of the patient were reviewed by me and considered in my medical decision making (see chart for details).     Pt comes in with cc of n/v/abd pain. Clinically she has gastroparesis, and we will admit this time, as it is the 2nd visit and she has failed out patient treatment and there is aki and dehydration.  Final Clinical Impressions(s) / ED Diagnoses   Final diagnoses:  AKI (acute kidney injury) ([place])  Gastroparesis  Hyperglycemia    Abdominal pain    New Prescriptions Current Discharge Medication List       Varney Biles, MD 04/28/16 1545

## 2016-04-29 ENCOUNTER — Inpatient Hospital Stay (HOSPITAL_COMMUNITY): Payer: Self-pay

## 2016-04-29 LAB — CBC
HEMATOCRIT: 27.5 % — AB (ref 36.0–46.0)
Hemoglobin: 9 g/dL — ABNORMAL LOW (ref 12.0–15.0)
MCH: 27.9 pg (ref 26.0–34.0)
MCHC: 32.7 g/dL (ref 30.0–36.0)
MCV: 85.1 fL (ref 78.0–100.0)
Platelets: 318 10*3/uL (ref 150–400)
RBC: 3.23 MIL/uL — ABNORMAL LOW (ref 3.87–5.11)
RDW: 13.7 % (ref 11.5–15.5)
WBC: 8.9 10*3/uL (ref 4.0–10.5)

## 2016-04-29 LAB — BASIC METABOLIC PANEL
Anion gap: 8 (ref 5–15)
BUN: 12 mg/dL (ref 6–20)
CHLORIDE: 104 mmol/L (ref 101–111)
CO2: 26 mmol/L (ref 22–32)
Calcium: 8.7 mg/dL — ABNORMAL LOW (ref 8.9–10.3)
Creatinine, Ser: 0.77 mg/dL (ref 0.44–1.00)
GFR calc Af Amer: >60 mL/min (ref >60)
GFR calc non Af Amer: >60 mL/min (ref >60)
GLUCOSE: 153 mg/dL — AB (ref 65–99)
POTASSIUM: 3.2 mmol/L — AB (ref 3.5–5.1)
Sodium: 138 mmol/L (ref 135–145)

## 2016-04-29 LAB — GLUCOSE, CAPILLARY
Glucose-Capillary: 158 mg/dL — ABNORMAL HIGH (ref 65–99)
Glucose-Capillary: 146 mg/dL — ABNORMAL HIGH (ref 65–99)
Glucose-Capillary: 301 mg/dL — ABNORMAL HIGH (ref 65–99)

## 2016-04-29 MED ORDER — METOCLOPRAMIDE HCL 10 MG PO TABS: 10.0000 mg | ORAL_TABLET | Freq: Three times a day (TID) | ORAL | 0 refills | Status: DC

## 2016-04-29 MED ORDER — OXYCODONE HCL 5 MG PO TABS: 5.0000 mg | ORAL_TABLET | Freq: Three times a day (TID) | ORAL | 0 refills | Status: DC | PRN

## 2016-04-29 MED ORDER — TECHNETIUM TC 99M SULFUR COLLOID
2.1000 | Freq: Once | INTRAVENOUS | Status: AC | PRN
  Administered 2016-04-29: 2.1 via ORAL

## 2016-04-29 NOTE — Discharge Summary (Signed)
Physician Discharge Summary  Deborah [name] CBS:[id number] DOB: 03-12-1965 DOA: 04/23/2016  PCP: Nolon Bussing, PA  Admit date: 04/23/2016 Discharge date: 04/29/2016  Admitted From: Home Disposition: Home   Recommendations for Outpatient Follow-up:  1. Follow up with PCP in 1-2 weeks 2. Please obtain BMP to monitor mild hypokalemia, 3.2 on day of discharge prior to repletion. Monitor anemia with CBC. 3. Follow up with GI as outpatient. Gastric emptying study normal while taking reglan. Told to stop taking bentyl as this may counteract motility.  Home Health: None Equipment/Devices: None Discharge Condition: Stable CODE STATUS: Full Diet recommendation: Frequent, small, carbohydrate-limited meals  Brief/Interim Summary: Deborah Sui [name] a 51 y.o.femalewith a past medical history significant for IDDM and gastroparesis, HTN, CKD III, hx of CVA, hx of PUD no GIB, and goutwho presents with vomiting for 5 days. The patient was in her usual state of health until about 5 or 6 days ago when she developed heartburn, diffuse abdominal cramps, and recurrent vomiting with any oral intake-all similar to her previous episodes of gastroparesis. She was seen 3 days prior in the ED, got IV fluids, Zofran, Haldol, and fentanyl 100 g, and felt improved and was able to go home. However, within a day, she was as bad as she had been before, with constant crampy abdominal discomfort, heartburn that is severe, vomiting, unable to take any of her blood pressure medicines, Reglan, or dicyclomine, and afraid to take her insulin. She returned to the ED for progressive weakness and was found to be dehydrated with AKI, subsequently admitted for further evaluation. IV fluids were given in addition to IV reglan with significant improvement in symptoms. Bentyl was held due to concern that this may worsen motility issues. GI, Dr. Amedeo Plenty, was consulted and recommended a gastric emptying study which was performed  on 2/22 and found to be normal (while the patient had been taking reglan), improved from priors. Emesis abated since admission and her po intake has normalized. She is eating regularly with tolerable abdominal pain and is stable for discharge home with reglan prescription.   Discharge Diagnoses:  Active Problems:   Essential hypertension, benign   Uncontrolled type 2 diabetes mellitus with diabetic neuropathy, with long-term current use of insulin (HCC)   CKD (chronic kidney disease) stage 3, GFR 30-59 ml/min   Nausea and vomiting   Gastroparesis   AKI (acute kidney injury) ([place])  Gastroparesis: Severely symptomatic causing dehydration and AKI, since improving. Gastric emptying scan is normal (see below) while taking reglan.  - Rehydrated back to normal creatinine and no further vomiting since admission with IV reglan, or with change to po reglan.  - Counseled to minimize opioids  - Continue GERD treatment as outpatient  Uncontrolled Insulin Dependent Diabetes Mellitus Type 2 complicated by Neuropathy and Gastroparesis: Diagnosed in 2000, on insulin since 2007. Hemoglobin A1c was 9.7 on 03/01/16 - Fair control with Lantus 40 units sq qHS and with Resistant Novolog SSI ACHS +4u TID WC (Takes novolog 70/30 75u qAM, 60u w/lunch, 50u w/dinner at home) - Continued duloxetine 30 mg po BID  Atypical chest pain: Resolved and ACS ruled out with normal troponins and nonischemic ECG. Echocardiogram showed mild LVH, EF of 60-65% with no regional wall motion abnormalities and a trivial pericardial effusion  Hypokalemia: Mild, presumed due to poor po.  - Monitor s/p repletion.   Hypomagnesemia: Refractory to repletion.  - Repeat magnesium and recheck  Normocytic anemia: Chronic, trending slowly downward with IVF, perhaps hemodilutional.  - Monitor.  Leukocytosis: Resolved, presumed due to stress demargination from N/V. CXR negative. UA not grossly infected.  -Patient's WBC went from 12.3 ->  12.8 >> 8.3 -No active S/Sx of Infection -CXR and Negative -Continue to Monitor and Repeat CBC  AKI: Resolved. Creatinine at admission 1.36 back to baseline at 0.92 after IV fluids.  - DC IVF now that taking po and getting trace LE edema  Dyslipidemia associated withType 2 Diabetes Mellitus - C/w Pravastatin 10 mg po Daily  Essential Hypertension - C/w Amlodipine 10 mg po daily; Metoprolol Succinate 25 mg po Daily   Gout - Allopurinol 100 mg po Daily held currently  Fibromyalgia - C/w Duloxetine 30 mg pog BID  Discharge Instructions Discharge Instructions    Discharge instructions    Complete by:  As directed    You were admitted for abdominal pain and vomiting which has improved with treatment. Your emptying study showed very good results when you are taking the reglan, so you should continue this.  - Take reglan three times a day with meals, a month's supply is being prescribed for you.  - Take tylenol as needed for abdominal pain. If your pain is severe, take oxycodone, though you should not require this very much if at all. Follow up with your PCP for further medication changes.  - STOP taking bentyl as this may be making gastroparesis worse at home.  - If your symptoms acutely worsen, seek medical advice right away. - Call [facility], for an appointment with Dr. Amedeo Plenty in the next 4 weeks.     Allergies as of 04/29/2016      Reactions   Lisinopril Anaphylaxis   Tongue and mouth swelling   Penicillins Palpitations   Has patient had a PCN reaction causing immediate rash, facial/tongue/throat swelling, SOB or lightheadedness with hypotension: Yes, heart races Has patient had a PCN reaction causing severe rash involving mucus membranes or skin necrosis: No Has patient had a PCN reaction that required hospitalization: Yes  Has patient had a PCN reaction occurring within the last 10 years: No   Valium [diazepam] [name]   Aspirin Other (See Comments)   Irritates  stomach ulcer    Food Hives, Swelling   Carrots, ketchup    Nsaids Other (See Comments)   ULCER   Tylenol [acetaminophen] Other (See Comments)   Irritates stomach ulcer    Morphine And Related Palpitations   Tramadol Nausea And Vomiting      Medication List    STOP taking these medications   dicyclomine 20 MG tablet Commonly known as:  BENTYL     TAKE these medications   albuterol 108 (90 Base) MCG/ACT inhaler Commonly known as:  PROVENTIL HFA;VENTOLIN HFA Inhale 2 puffs into the lungs every 6 (six) hours as needed for wheezing or shortness of breath.   allopurinol 100 MG tablet Commonly known as:  ZYLOPRIM Take 1 tablet (100 mg total) by mouth daily.   amLODipine 10 MG tablet Commonly known as:  NORVASC Take 1 tablet (10 mg total) by mouth daily.   colchicine 0.6 MG tablet Take 0.6 mg by mouth daily as needed.   DULoxetine 30 MG capsule Commonly known as:  CYMBALTA Take 1 capsule (30 mg total) by mouth 2 (two) times daily.   metoCLOPramide 10 MG tablet Commonly known as:  REGLAN Take 1 tablet (10 mg total) by mouth 3 (three) times daily before meals. What changed:  when to take this   metoprolol succinate 25 MG 24 hr  tablet Commonly known as:  TOPROL-XL Take 1 tablet (25 mg total) by mouth daily.   nitroGLYCERIN 0.4 MG SL tablet Commonly known as:  NITROSTAT Place 1 tablet (0.4 mg total) under the tongue every 5 (five) minutes as needed for chest pain.   NOVOLOG MIX 70/30 (70-30) 100 UNIT/ML injection Generic drug:  insulin aspart protamine- aspart Inject 0.5-0.75 mLs (50-75 Units total) into the skin 3 (three) times daily after meals. 75 units every morning, 60 units with lunch, and 50 units with dinner   ondansetron 4 MG disintegrating tablet Commonly known as:  ZOFRAN ODT Take 1 tablet (4 mg total) by mouth every 8 (eight) hours as needed for nausea or vomiting.   oxyCODONE 5 MG immediate release tablet Commonly known as:  Oxy IR/ROXICODONE Take 1  tablet (5 mg total) by mouth every 8 (eight) hours as needed for severe pain.   pantoprazole 40 MG tablet Commonly known as:  PROTONIX Take 1 tablet (40 mg total) by mouth 2 (two) times daily.   pravastatin 10 MG tablet Commonly known as:  PRAVACHOL Take 1 tablet (10 mg total) by mouth daily.      Follow-up Information    Nolon Bussing, PA Follow up.   Specialty:  General Practice Contact information: [place] [address] [phone]        Missy Sabins, MD. Schedule an appointment as soon as possible for a visit in 1 month(s).   Specialty:  Gastroenterology Contact information: [address] [place] [address] [phone]          Allergies  Allergen Reactions  . Lisinopril Anaphylaxis    Tongue and mouth swelling  . Penicillins Palpitations    Has patient had a PCN reaction causing immediate rash, facial/tongue/throat swelling, SOB or lightheadedness with hypotension: Yes, heart races Has patient had a PCN reaction causing severe rash involving mucus membranes or skin necrosis: No Has patient had a PCN reaction that required hospitalization: Yes  Has patient had a PCN reaction occurring within the last 10 years: No   . Valium [Diazepam] [name]  . Aspirin Other (See Comments)    Irritates stomach ulcer   . Food Hives and Swelling    Carrots, ketchup   . Nsaids Other (See Comments)    ULCER  . Tylenol [Acetaminophen] Other (See Comments)    Irritates stomach ulcer   . Morphine And Related Palpitations  . Tramadol Nausea And Vomiting    Consultations:  [facility], Dr. Amedeo Plenty  Procedures/Studies: Dg Chest 2 View  Result Date: 04/25/2016 CLINICAL DATA:  Acute onset of central chest pain. Initial encounter. EXAM: CHEST  2 VIEW COMPARISON:  Chest radiograph performed 04/23/2016 FINDINGS: The lungs are well-aerated. Pulmonary vascularity is at the upper limits of normal. Mild peribronchial thickening  is noted. There is no evidence of focal opacification, pleural effusion or pneumothorax. The heart is normal in size; the mediastinal contour is within normal limits. No acute osseous abnormalities are seen. IMPRESSION: Mild peribronchial thickening noted.  Lungs otherwise grossly clear. Electronically Signed   By: Garald Balding M.D.   On: 04/25/2016 17:10   Dg Chest 2 View  Result Date: 04/23/2016 CLINICAL DATA:  Central chest pains and all over abd pain for 3-4 days. SOB. Weakness. Fever. N/V for past 3 days. Pt takes HTN meds. IDDM. Nonsmoker. EXAM: CHEST  2 VIEW COMPARISON:  04/20/2016 FINDINGS: Lateral view degraded by patient arm position. Midline trachea. Normal heart size and  mediastinal contours. No pleural effusion or pneumothorax. Clear lungs. IMPRESSION: No acute cardiopulmonary disease. Electronically Signed   By: Abigail Miyamoto M.D.   On: 04/23/2016 15:13   Dg Chest 2 View  Result Date: 04/20/2016 CLINICAL DATA:  Seven onset of mid and left chest pain last night radiating into the left arm and jaw all associated with shortness of breath. History of diabetes, dyslipidemia, nonsmoker. EXAM: CHEST  2 VIEW COMPARISON:  PA and lateral chest X ray dated February 28, 2016 FINDINGS: The lungs are adequately inflated. There is no focal infiltrate. There is no pleural effusion or pneumothorax. The heart and pulmonary vascularity are normal. The mediastinum is normal in width. The trachea is midline. The bony thorax exhibits no acute abnormality. IMPRESSION: There is no pneumonia nor other acute cardiopulmonary abnormality. Electronically Signed   By: David  Martinique M.D.   On: 04/20/2016 12:04   Ct Angio Chest Pe W/cm &/or Wo Cm  Result Date: 04/20/2016 CLINICAL DATA:  Chest pain.  [name]. EXAM: CT ANGIOGRAPHY CHEST WITH CONTRAST TECHNIQUE: Multidetector CT imaging of the chest was performed using the standard protocol during bolus administration of intravenous contrast. Multiplanar CT  image reconstructions and MIPs were obtained to evaluate the vascular anatomy. CONTRAST:  100 mL of Isovue 370 intravenously. COMPARISON:  Radiographs of same day.  CT scan of January 14, 2016. FINDINGS: Cardiovascular: Satisfactory opacification of the pulmonary arteries to the segmental level. No evidence of pulmonary embolism. Normal heart size. No pericardial effusion. Mediastinum/Nodes: No enlarged mediastinal, hilar, or axillary lymph nodes. Thyroid gland, trachea, and esophagus demonstrate no significant findings. Lungs/Pleura: Lungs are clear. No pleural effusion or pneumothorax. Upper Abdomen: Fatty infiltration of the liver is noted. Musculoskeletal: No chest wall abnormality. No acute or significant osseous findings. Review of the MIP images confirms the above findings. IMPRESSION: No evidence of pulmonary embolus. Fatty infiltration of the liver. No other abnormality seen in the chest. Electronically Signed   By: Marijo Conception, M.D.   On: 04/20/2016 15:27   Nm Gastric Emptying  Result Date: 04/29/2016 CLINICAL DATA:  Abdominal pain and nausea and vomiting for the past year. EXAM: NUCLEAR MEDICINE GASTRIC EMPTYING SCAN TECHNIQUE: After oral ingestion of radiolabeled meal, sequential abdominal images were obtained for 4 hours. Percentage of activity emptying the stomach was calculated at 1 hour, 2 hour, 3 hour, and 4 hours. RADIOPHARMACEUTICALS:  2.1 mCi Tc-97m sulfur colloid in standardized meal COMPARISON:  None in PACs FINDINGS: Expected location of the stomach in the left upper quadrant. Ingested meal empties the stomach gradually over the course of the study. 53% emptied at 1 hr ( normal >= 10%) 85% emptied at 2 hr ( normal >= 40%) 95% emptied at 3 hr ( normal >= 70%) IMPRESSION: Normal gastric emptying study. Electronically Signed   By: David  Martinique M.D.   On: 04/29/2016 13:44   Dg Abd 2 Views  Result Date: 04/23/2016 CLINICAL DATA:  Initial evaluation for acute mid abdominal pain. History  of diabetes, gastric ulcer, gastroparesis, prior cholecystectomy. The EXAM: ABDOMEN - 2 VIEW COMPARISON:  None. FINDINGS: Paucity of gas limits evaluation of the bowels. No evidence for obstruction or ileus. No free air. No soft tissue mass or abnormal calcification. Cholecystectomy clips noted. Visualized lung bases are clear. No acute osseus abnormality. Degenerative changes noted about the hips bilaterally. IMPRESSION: Nonobstructive bowel gas pattern with no radiographic evidence for acute intra-abdominal process. Electronically Signed   By: Jeannine Boga M.D.   On: 04/23/2016  22:21   Subjective: After emptying study pt reports abdominal pain has resolved. She's not had any vomiting since admission. No abnormality in urine or stool output. Gi recommends discharge and no further evaluation.   Discharge Exam: Vitals:   04/29/16 1448 04/29/16 1514  BP: (!) 127/92 120/76  Pulse: (!) 110   Resp: 16   Temp: 98.1 F (36.7 C)    General: Pleasant, obese 51yo F in no distress.  Cardiovascular: RRR, S1/S2 +, no rubs, no gallops Respiratory: Nonlabored, CTA bilaterally  Abdominal: Soft, obese, nontender, not distended, normoactive bowel sounds Extremities: No edema, no cyanosis  The results of significant diagnostics from this hospitalization (including imaging, microbiology, ancillary and laboratory) are listed below for reference.    Labs: Basic Metabolic Panel:  Recent Labs Lab 04/25/16 0258 04/25/16 1438 04/26/16 0548 04/27/16 0543 04/28/16 0608 04/29/16 0545  NA 140 137 138 138 139 138  K 2.7* 2.8* 3.1* 3.5 3.3* 3.2*  CL 105 102 105 108 108 104  CO2 24 22 23 24 27 26   GLUCOSE 193* 305* 178* 185* 210* 153*  BUN 11 8 9 13 12 12   CREATININE 1.09* 1.06* 1.18* 0.98 0.92 0.77  CALCIUM 8.8* 8.5* 8.4* 8.5* 8.6* 8.7*  MG 1.2*  --  1.5* 1.4* 1.3*  --   PHOS 2.0*  --  1.9* 2.5 2.5  --    Liver Function Tests:  Recent Labs Lab 04/24/16 0600 04/25/16 0258 04/26/16 0548  04/27/16 0543 04/28/16 0608  AST 14* 25 32 26 19  ALT 13* 17 16 18 16   ALKPHOS 77 82 83 82 84  BILITOT 0.5 0.7 0.8 0.8 0.5  PROT 6.8 8.2* 7.3 6.2* 6.2*  ALBUMIN 3.3* 3.9 3.5 2.9* 3.1*   CBC:  Recent Labs Lab 04/25/16 0258 04/26/16 0548 04/27/16 0543 04/28/16 0608 04/29/16 0545  WBC 11.2* 10.4 9.3 8.3 8.9  NEUTROABS 6.5 6.2 5.1 4.5  --   HGB 10.8* 10.2* 9.3* 8.8* 9.0*  HCT 32.8* 32.4* 28.6* 27.9* 27.5*  MCV 85.2 86.4 88.5 86.4 85.1  PLT 367 382 323 312 318   Cardiac Enzymes:  Recent Labs Lab 04/24/16 0600 04/25/16 0258 04/25/16 0834 04/25/16 1506  TROPONINI <0.03 <0.03 <0.03 <0.03   CBG:  Recent Labs Lab 04/28/16 1125 04/28/16 1704 04/28/16 2139 04/29/16 0714 04/29/16 1309  GLUCAP 170* 203* 245* 158* 146*   Urinalysis    Component Value Date/Time   COLORURINE YELLOW 04/20/2016 1350   APPEARANCEUR CLOUDY (A) 04/20/2016 1350   LABSPEC 1.017 04/20/2016 1350   PHURINE 5.0 04/20/2016 1350   GLUCOSEU >=500 (A) 04/20/2016 1350   HGBUR MODERATE (A) 04/20/2016 1350   BILIRUBINUR NEGATIVE 04/20/2016 1350   KETONESUR 5 (A) 04/20/2016 1350   PROTEINUR 100 (A) 04/20/2016 1350   UROBILINOGEN 0.2 10/02/2013 2108   NITRITE NEGATIVE 04/20/2016 1350   LEUKOCYTESUR MODERATE (A) 04/20/2016 1350   Time coordinating discharge: Approximately 40 minutes  Vance Gather, MD  [facility] 04/29/2016, 3:58 PM Pager [phone]

## 2016-06-05 ENCOUNTER — Encounter (HOSPITAL_COMMUNITY): Payer: Self-pay

## 2016-06-05 ENCOUNTER — Inpatient Hospital Stay (HOSPITAL_COMMUNITY)
Admission: EM | Admit: 2016-06-05 | Discharge: 2016-06-08 | DRG: 074 | Disposition: A | Discharge status: Home / Self Care | Payer: Self-pay | Attending: Family Medicine | Admitting: Family Medicine

## 2016-06-05 ENCOUNTER — Emergency Department (HOSPITAL_COMMUNITY): Payer: Self-pay

## 2016-06-05 DIAGNOSIS — Z8249 Family history of ischemic heart disease and other diseases of the circulatory system: Secondary | ICD-10-CM

## 2016-06-05 DIAGNOSIS — Z888 Allergy status to other drugs, medicaments and biological substances status: Secondary | ICD-10-CM

## 2016-06-05 DIAGNOSIS — L03314 Cellulitis of groin: Secondary | ICD-10-CM | POA: Diagnosis present

## 2016-06-05 DIAGNOSIS — E860 Dehydration: Secondary | ICD-10-CM

## 2016-06-05 DIAGNOSIS — Z79899 Other long term (current) drug therapy: Secondary | ICD-10-CM

## 2016-06-05 DIAGNOSIS — G4733 Obstructive sleep apnea (adult) (pediatric): Secondary | ICD-10-CM | POA: Diagnosis present

## 2016-06-05 DIAGNOSIS — N183 Chronic kidney disease, stage 3 (moderate): Secondary | ICD-10-CM | POA: Diagnosis present

## 2016-06-05 DIAGNOSIS — E1169 Type 2 diabetes mellitus with other specified complication: Secondary | ICD-10-CM

## 2016-06-05 DIAGNOSIS — K3184 Gastroparesis: Secondary | ICD-10-CM | POA: Diagnosis present

## 2016-06-05 DIAGNOSIS — I69351 Hemiplegia and hemiparesis following cerebral infarction affecting right dominant side: Secondary | ICD-10-CM

## 2016-06-05 DIAGNOSIS — N182 Chronic kidney disease, stage 2 (mild): Secondary | ICD-10-CM | POA: Diagnosis present

## 2016-06-05 DIAGNOSIS — Z91018 Allergy to other foods: Secondary | ICD-10-CM

## 2016-06-05 DIAGNOSIS — I10 Essential (primary) hypertension: Secondary | ICD-10-CM | POA: Diagnosis present

## 2016-06-05 DIAGNOSIS — Z9049 Acquired absence of other specified parts of digestive tract: Secondary | ICD-10-CM

## 2016-06-05 DIAGNOSIS — N184 Chronic kidney disease, stage 4 (severe): Secondary | ICD-10-CM | POA: Diagnosis present

## 2016-06-05 DIAGNOSIS — M109 Gout, unspecified: Secondary | ICD-10-CM | POA: Diagnosis present

## 2016-06-05 DIAGNOSIS — Z6841 Body Mass Index (BMI) 40.0 and over, adult: Secondary | ICD-10-CM

## 2016-06-05 DIAGNOSIS — M1A9XX0 Chronic gout, unspecified, without tophus (tophi): Secondary | ICD-10-CM | POA: Diagnosis present

## 2016-06-05 DIAGNOSIS — M797 Fibromyalgia: Secondary | ICD-10-CM | POA: Diagnosis present

## 2016-06-05 DIAGNOSIS — N1832 Chronic kidney disease, stage 3b: Secondary | ICD-10-CM | POA: Diagnosis present

## 2016-06-05 DIAGNOSIS — E1122 Type 2 diabetes mellitus with diabetic chronic kidney disease: Secondary | ICD-10-CM | POA: Diagnosis present

## 2016-06-05 DIAGNOSIS — R079 Chest pain, unspecified: Secondary | ICD-10-CM | POA: Diagnosis present

## 2016-06-05 DIAGNOSIS — K219 Gastro-esophageal reflux disease without esophagitis: Secondary | ICD-10-CM | POA: Diagnosis present

## 2016-06-05 DIAGNOSIS — Z880 Allergy status to penicillin: Secondary | ICD-10-CM

## 2016-06-05 DIAGNOSIS — Z833 Family history of diabetes mellitus: Secondary | ICD-10-CM

## 2016-06-05 DIAGNOSIS — Z8711 Personal history of peptic ulcer disease: Secondary | ICD-10-CM

## 2016-06-05 DIAGNOSIS — E119 Type 2 diabetes mellitus without complications: Secondary | ICD-10-CM | POA: Diagnosis present

## 2016-06-05 DIAGNOSIS — F319 Bipolar disorder, unspecified: Secondary | ICD-10-CM | POA: Diagnosis present

## 2016-06-05 DIAGNOSIS — E1165 Type 2 diabetes mellitus with hyperglycemia: Secondary | ICD-10-CM | POA: Diagnosis present

## 2016-06-05 DIAGNOSIS — Z886 Allergy status to analgesic agent status: Secondary | ICD-10-CM

## 2016-06-05 DIAGNOSIS — Z794 Long term (current) use of insulin: Secondary | ICD-10-CM | POA: Diagnosis present

## 2016-06-05 DIAGNOSIS — E876 Hypokalemia: Secondary | ICD-10-CM | POA: Diagnosis present

## 2016-06-05 DIAGNOSIS — E785 Hyperlipidemia, unspecified: Secondary | ICD-10-CM | POA: Diagnosis present

## 2016-06-05 DIAGNOSIS — E1143 Type 2 diabetes mellitus with diabetic autonomic (poly)neuropathy: Principal | ICD-10-CM | POA: Diagnosis present

## 2016-06-05 DIAGNOSIS — Z885 Allergy status to narcotic agent status: Secondary | ICD-10-CM

## 2016-06-05 DIAGNOSIS — I129 Hypertensive chronic kidney disease with stage 1 through stage 4 chronic kidney disease, or unspecified chronic kidney disease: Secondary | ICD-10-CM | POA: Diagnosis present

## 2016-06-05 HISTORY — DX: Gout, unspecified: M10.9

## 2016-06-05 HISTORY — DX: Hyperlipidemia, unspecified: E78.5

## 2016-06-05 LAB — COMPREHENSIVE METABOLIC PANEL
ALBUMIN: 3.9 g/dL (ref 3.5–5.0)
ALT: 18 U/L (ref 14–54)
ANION GAP: 15 (ref 5–15)
AST: 24 U/L (ref 15–41)
Alkaline Phosphatase: 110 U/L (ref 38–126)
BILIRUBIN TOTAL: 1 mg/dL (ref 0.3–1.2)
BUN: 16 mg/dL (ref 6–20)
CHLORIDE: 97 mmol/L — AB (ref 101–111)
CO2: 22 mmol/L (ref 22–32)
Calcium: 9.3 mg/dL (ref 8.9–10.3)
Creatinine, Ser: 1.15 mg/dL — ABNORMAL HIGH (ref 0.44–1.00)
GFR calc Af Amer: >60 mL/min (ref >60)
GFR calc non Af Amer: 54 mL/min — ABNORMAL LOW (ref >60)
GLUCOSE: 404 mg/dL — AB (ref 65–99)
POTASSIUM: 3.2 mmol/L — AB (ref 3.5–5.1)
Sodium: 134 mmol/L — ABNORMAL LOW (ref 135–145)
TOTAL PROTEIN: 8.6 g/dL — AB (ref 6.5–8.1)

## 2016-06-05 LAB — URINALYSIS, ROUTINE W REFLEX MICROSCOPIC
BILIRUBIN URINE: NEGATIVE
Glucose, UA: >=500 mg/dL — AB
Ketones, ur: NEGATIVE mg/dL
NITRITE: NEGATIVE
PH: 5 (ref 5.0–8.0)
Protein, ur: >=300 mg/dL — AB
SPECIFIC GRAVITY, URINE: 1.023 (ref 1.005–1.030)

## 2016-06-05 LAB — LIPASE, BLOOD: Lipase: 23 U/L (ref 11–51)

## 2016-06-05 LAB — CBC
HEMATOCRIT: 37.1 % (ref 36.0–46.0)
HEMOGLOBIN: 12.5 g/dL (ref 12.0–15.0)
MCH: 28.7 pg (ref 26.0–34.0)
MCHC: 33.7 g/dL (ref 30.0–36.0)
MCV: 85.3 fL (ref 78.0–100.0)
Platelets: 411 10*3/uL — ABNORMAL HIGH (ref 150–400)
RBC: 4.35 MIL/uL (ref 3.87–5.11)
RDW: 13.2 % (ref 11.5–15.5)
WBC: 13.8 10*3/uL — ABNORMAL HIGH (ref 4.0–10.5)

## 2016-06-05 LAB — CBG MONITORING, ED
GLUCOSE-CAPILLARY: 426 mg/dL — AB (ref 65–99)
Glucose-Capillary: 363 mg/dL — ABNORMAL HIGH (ref 65–99)

## 2016-06-05 LAB — TROPONIN I

## 2016-06-05 LAB — I-STAT TROPONIN, ED: Troponin i, poc: 0 ng/mL (ref 0.00–0.08)

## 2016-06-05 LAB — GLUCOSE, CAPILLARY: GLUCOSE-CAPILLARY: 289 mg/dL — AB (ref 65–99)

## 2016-06-05 MED ORDER — HYDROMORPHONE HCL 1 MG/ML IJ SOLN
1.0000 mg | Freq: Once | INTRAMUSCULAR | Status: AC
  Administered 2016-06-05: 1 mg via INTRAVENOUS
  Filled 2016-06-05: qty 1

## 2016-06-05 MED ORDER — METOCLOPRAMIDE HCL 5 MG/ML IJ SOLN
10.0000 mg | Freq: Once | INTRAMUSCULAR | Status: AC
  Administered 2016-06-05: 10 mg via INTRAVENOUS
  Filled 2016-06-05: qty 2

## 2016-06-05 MED ORDER — HYDROMORPHONE HCL 1 MG/ML IJ SOLN
1.0000 mg | INTRAMUSCULAR | Status: DC | PRN
Start: 2016-06-05 — End: 2016-06-06
  Administered 2016-06-06: 1 mg via INTRAVENOUS
  Filled 2016-06-05: qty 1

## 2016-06-05 MED ORDER — SODIUM CHLORIDE 0.9 % IV BOLUS (SEPSIS)
1000.0000 mL | Freq: Once | INTRAVENOUS | Status: AC
  Administered 2016-06-05: 1000 mL via INTRAVENOUS

## 2016-06-05 MED ORDER — PANTOPRAZOLE SODIUM 40 MG IV SOLR
40.0000 mg | INTRAVENOUS | Status: DC
  Administered 2016-06-05 – 2016-06-07 (×3): 40 mg via INTRAVENOUS
  Filled 2016-06-05 (×3): qty 40

## 2016-06-05 MED ORDER — POTASSIUM CHLORIDE IN NACL 20-0.45 MEQ/L-% IV SOLN
INTRAVENOUS | Status: DC
  Administered 2016-06-05 – 2016-06-07 (×5): via INTRAVENOUS
  Filled 2016-06-05 (×6): qty 1000

## 2016-06-05 MED ORDER — NITROGLYCERIN 0.4 MG SL SUBL: 0.4000 mg | SUBLINGUAL_TABLET | SUBLINGUAL | Status: DC | PRN

## 2016-06-05 MED ORDER — PROMETHAZINE HCL 25 MG/ML IJ SOLN
12.5000 mg | Freq: Once | INTRAMUSCULAR | Status: AC
  Administered 2016-06-05: 12.5 mg via INTRAVENOUS
  Filled 2016-06-05 (×2): qty 1

## 2016-06-05 MED ORDER — ONDANSETRON HCL 4 MG/2ML IJ SOLN
4.0000 mg | Freq: Four times a day (QID) | INTRAMUSCULAR | Status: DC | PRN
  Administered 2016-06-06 – 2016-06-07 (×3): 4 mg via INTRAVENOUS
  Filled 2016-06-05 (×4): qty 2

## 2016-06-05 MED ORDER — PROMETHAZINE HCL 25 MG/ML IJ SOLN
25.0000 mg | Freq: Once | INTRAMUSCULAR | Status: AC
  Administered 2016-06-05: 25 mg via INTRAVENOUS
  Filled 2016-06-05: qty 1

## 2016-06-05 MED ORDER — LIP MEDEX EX OINT
TOPICAL_OINTMENT | CUTANEOUS | Status: AC
  Administered 2016-06-05: 21:00:00
  Filled 2016-06-05: qty 7

## 2016-06-05 MED ORDER — ONDANSETRON HCL 4 MG/2ML IJ SOLN
4.0000 mg | Freq: Once | INTRAMUSCULAR | Status: AC
  Administered 2016-06-05: 4 mg via INTRAVENOUS
  Filled 2016-06-05: qty 2

## 2016-06-05 MED ORDER — POTASSIUM CHLORIDE IN NACL 20-0.9 MEQ/L-% IV SOLN
INTRAVENOUS | Status: AC
  Administered 2016-06-05: 18:00:00 via INTRAVENOUS
  Filled 2016-06-05: qty 1000

## 2016-06-05 MED ORDER — METOCLOPRAMIDE HCL 5 MG/ML IJ SOLN
10.0000 mg | Freq: Four times a day (QID) | INTRAMUSCULAR | Status: DC
  Administered 2016-06-05 – 2016-06-08 (×12): 10 mg via INTRAVENOUS
  Filled 2016-06-05 (×12): qty 2

## 2016-06-05 MED ORDER — LACTATED RINGERS IV BOLUS (SEPSIS)
1000.0000 mL | Freq: Once | INTRAVENOUS | Status: AC
  Administered 2016-06-05: 1000 mL via INTRAVENOUS

## 2016-06-05 MED ORDER — SODIUM CHLORIDE 0.9% FLUSH
3.0000 mL | Freq: Two times a day (BID) | INTRAVENOUS | Status: DC
  Administered 2016-06-07 – 2016-06-08 (×3): 3 mL via INTRAVENOUS

## 2016-06-05 MED ORDER — INSULIN ASPART 100 UNIT/ML ~~LOC~~ SOLN
0.0000 [IU] | Freq: Three times a day (TID) | SUBCUTANEOUS | Status: DC
  Administered 2016-06-06: 5 [IU] via SUBCUTANEOUS
  Administered 2016-06-06 (×2): 8 [IU] via SUBCUTANEOUS
  Administered 2016-06-07 – 2016-06-08 (×4): 5 [IU] via SUBCUTANEOUS
  Administered 2016-06-08: 3 [IU] via SUBCUTANEOUS

## 2016-06-05 NOTE — H&P (Signed)
History and Physical    Deborah [name] OAC:[id number] DOB: 11-10-1965 DOA: 06/05/2016  PCP: Nolon Bussing, PA   Patient coming from: Home.  I have personally briefly reviewed patient's old medical records in [place]  Chief Complaint: Abdominal pain and vomiting.  HPI: Deborah [name] is a 51 y.o. female with medical history significant of type 2 diabetes, hypertension, hyperlipidemia, gout, morbid obesity, history of stroke with mild right-sided hemiparesis, PUD, fibromyalgia, diabetic gastroparesis who is coming to the emergency department with complaints of abdominal pain, nausea, multiple episodes of emesis, chest heaviness, fatigue and malaise since yesterday evening. She denies dizziness, dyspnea, diaphoresis, PND, orthopnea or recent pitting edema of the lower extremities. She denies diarrhea, constipation, melena or hematochezia. She denies productive cough, wheezing, dysuria or hematuria. She also complains of lower back pain worsened by emesis.  ED Course: Workup in the emergency department shows a WBC of 13.8, hemoglobin 12.5 g/dL and platelets of 411. Sodium 134, potassium 3.2, chloride 97, bicarbonate 22 mmol/L. BUN was 16, creatinine 1.15 and glucose 404 mg/dL. First troponin level was normal and EKG show sinus tachycardia 126 BPM, borderline ST segment abnormalities. Her chest radiograph did not show any acute abnormalities.  Review of Systems: As per HPI otherwise 10 point review of systems negative.    Past Medical History:  Diagnosis Date  . Chest pain 12/2015  . Diabetes mellitus   . Fibromyalgia   . Gastric ulcer   . Gastroparesis   . Gout   . Hyperlipidemia   . Hypertension   . Obesity   . Pneumonia   . Stroke [facility]) 02/2011    Past Surgical History:  Procedure Laterality Date  . CATARACT EXTRACTION  01/2014  . CHOLECYSTECTOMY       reports that she has never smoked. She has never used smokeless tobacco. She reports that she does not  drink alcohol or use drugs.  Allergies  Allergen Reactions  . Lisinopril Anaphylaxis    Tongue and mouth swelling  . Penicillins Palpitations    Has patient had a PCN reaction causing immediate rash, facial/tongue/throat swelling, SOB or lightheadedness with hypotension: Yes, heart races Has patient had a PCN reaction causing severe rash involving mucus membranes or skin necrosis: No Has patient had a PCN reaction that required hospitalization: Yes  Has patient had a PCN reaction occurring within the last 10 years: No   . Valium [Diazepam] [name]  . Aspirin Other (See Comments)    Irritates stomach ulcer   . Food Hives and Swelling    Carrots, ketchup   . Nsaids Other (See Comments)    ULCER  . Tylenol [Acetaminophen] Other (See Comments)    Irritates stomach ulcer   . Morphine And Related Palpitations  . Tramadol Nausea And Vomiting    Family History  Problem Relation Age of Onset  . Diabetes Mother   . Diabetes Father   . Heart disease Father   . Diabetes Sister   . Congestive Heart Failure Sister 58  . Diabetes Brother     Prior to Admission medications   Medication Sig Start Date End Date Taking? Authorizing Provider  albuterol (PROVENTIL HFA;VENTOLIN HFA) 108 (90 Base) MCG/ACT inhaler Inhale 2 puffs into the lungs every 6 (six) hours as needed for wheezing or shortness of breath. 09/23/15  Yes Debbe Odea, MD  allopurinol (ZYLOPRIM) 100 MG tablet Take 1 tablet (100 mg total) by mouth daily. 09/23/15  Yes Debbe Odea, MD  amLODipine ([place]) 10  MG tablet Take 1 tablet (10 mg total) by mouth daily. 09/23/15  Yes Debbe Odea, MD  atorvastatin (LIPITOR) 20 MG tablet Take 20 mg by mouth at bedtime.   Yes Historical Provider, MD  colchicine 0.6 MG tablet Take 0.6 mg by mouth daily as needed.  01/31/16  Yes Historical Provider, MD  dicyclomine (BENTYL) 20 MG tablet Take 20 mg by mouth 2 (two) times daily.   Yes Historical Provider, MD  DULoxetine (CYMBALTA) 30  MG capsule Take 1 capsule (30 mg total) by mouth 2 (two) times daily. 09/23/15  Yes Debbe Odea, MD  metoCLOPramide (REGLAN) 10 MG tablet Take 1 tablet (10 mg total) by mouth 3 (three) times daily before meals. 04/29/16  Yes Patrecia Pour, MD  metoprolol succinate (TOPROL-XL) 25 MG 24 hr tablet Take 1 tablet (25 mg total) by mouth daily. 09/23/15  Yes Debbe Odea, MD  nitroGLYCERIN (NITROSTAT) 0.4 MG SL tablet Place 1 tablet (0.4 mg total) under the tongue every 5 (five) minutes as needed for chest pain. 12/20/15  Yes Mauricio Gerome Apley, MD  NOVOLOG MIX 70/30 (70-30) 100 UNIT/ML injection Inject 0.5-0.75 mLs (50-75 Units total) into the skin 3 (three) times daily after meals. 75 units every morning, 60 units with lunch, and 50 units with dinner 09/23/15  Yes Debbe Odea, MD  ondansetron (ZOFRAN ODT) 4 MG disintegrating tablet Take 1 tablet (4 mg total) by mouth every 8 (eight) hours as needed for nausea or vomiting. 04/20/16  Yes Waynetta Pean, PA-C  oxyCODONE (OXY IR/ROXICODONE) 5 MG immediate release tablet Take 1 tablet (5 mg total) by mouth every 8 (eight) hours as needed for severe pain. 04/29/16  Yes Patrecia Pour, MD  pantoprazole (PROTONIX) 40 MG tablet Take 1 tablet (40 mg total) by mouth 2 (two) times daily. 09/23/15  Yes Debbe Odea, MD  ranitidine (ZANTAC) 150 MG tablet Take 150 mg by mouth 2 (two) times daily.   Yes Historical Provider, MD  pravastatin (PRAVACHOL) 10 MG tablet Take 1 tablet (10 mg total) by mouth daily. Patient not taking: Reported on 06/05/2016 09/23/15   Debbe Odea, MD    Physical Exam:  Constitutional: NAD, calm, comfortable Vitals:   06/05/16 1430 06/05/16 1500 06/05/16 1533 06/05/16 1640  BP: 107/68 103/68 110/70 120/69  Pulse: (!) 121 (!) 117 (!) 115 (!) 112  Resp: 18 (!) 21 19 20   Temp:    98.4 F (36.9 C)  TempSrc:    Oral  SpO2: 94% 95% 97% 97%  Weight:    131.6 kg (290 lb 2 oz)  Height:    5\' 6"  (1.676 m)   Eyes: PERRL, lids and conjunctivae  normal ENMT: Mucous membranes are moist. Posterior pharynx clear of any exudate or lesions. Neck: Normal, supple, no masses, no thyromegaly Respiratory: Clear to auscultation bilaterally, no wheezing, no crackles. Normal respiratory effort. No accessory muscle use.  Cardiovascular: Tachycardic at 112 bpm, no murmurs / rubs / gallops. No extremity edema. 2+ pedal pulses. No carotid bruits.  Abdomen: Obese. Soft, mild diffuse tenderness, no guarding/rebound/masses palpated. No hepatosplenomegaly. Bowel sounds positive.  Musculoskeletal: No clubbing / cyanosis. Good ROM, no contractures. Normal muscle tone.  Skin: no rashes, lesions, ulcers on limited skin exam Neurologic: CN 2-12 grossly intact. Sensation intact, DTR normal. Strength 5/5 in all 4.  Psychiatric: Normal judgment and insight. Alert and oriented x 3. Mildly anxious mood.    Labs on Admission: I have personally reviewed following labs and imaging studies  CBC:  Recent  Labs Lab 06/05/16 1046  WBC 13.8*  HGB 12.5  HCT 37.1  MCV 85.3  PLT 627*   Basic Metabolic Panel:  Recent Labs Lab 06/05/16 1046  NA 134*  K 3.2*  CL 97*  CO2 22  GLUCOSE 404*  BUN 16  CREATININE 1.15*  CALCIUM 9.3   GFR: Estimated Creatinine Clearance: 79.7 mL/min (A) (by C-G formula based on SCr of 1.15 mg/dL (H)). Liver Function Tests:  Recent Labs Lab 06/05/16 1046  AST 24  ALT 18  ALKPHOS 110  BILITOT 1.0  PROT 8.6*  ALBUMIN 3.9    Recent Labs Lab 06/05/16 1046  LIPASE 23   No results for input(s): AMMONIA in the last 168 hours. Coagulation Profile: No results for input(s): INR, PROTIME in the last 168 hours. Cardiac Enzymes: No results for input(s): CKTOTAL, CKMB, CKMBINDEX, TROPONINI in the last 168 hours. BNP (last 3 results) No results for input(s): PROBNP in the last 8760 hours. HbA1C: No results for input(s): HGBA1C in the last 72 hours. CBG:  Recent Labs Lab 06/05/16 1043 06/05/16 1316  GLUCAP 426* 363*    Lipid Profile: No results for input(s): CHOL, HDL, LDLCALC, TRIG, CHOLHDL, LDLDIRECT in the last 72 hours. Thyroid Function Tests: No results for input(s): TSH, T4TOTAL, FREET4, T3FREE, THYROIDAB in the last 72 hours. Anemia Panel: No results for input(s): VITAMINB12, FOLATE, FERRITIN, TIBC, IRON, RETICCTPCT in the last 72 hours. Urine analysis:    Component Value Date/Time   COLORURINE YELLOW 06/05/2016 1135   APPEARANCEUR CLOUDY (A) 06/05/2016 1135   LABSPEC 1.023 06/05/2016 1135   PHURINE 5.0 06/05/2016 1135   GLUCOSEU >=500 (A) 06/05/2016 1135   HGBUR MODERATE (A) 06/05/2016 1135   BILIRUBINUR NEGATIVE 06/05/2016 1135   KETONESUR NEGATIVE 06/05/2016 1135   PROTEINUR >=300 (A) 06/05/2016 1135   UROBILINOGEN 0.2 10/02/2013 2108   NITRITE NEGATIVE 06/05/2016 1135   LEUKOCYTESUR LARGE (A) 06/05/2016 1135    Radiological Exams on Admission: Dg Chest 2 View  Result Date: 06/05/2016 CLINICAL DATA:  She c/o SOB and chest pain and upper abd pain since yesterday evening. Pt has no other chest complaints. PT Hx: diabetic, HTN, pneumonia EXAM: CHEST - 2 VIEW COMPARISON:  04/25/2016 FINDINGS: Lungs are clear. Heart size and mediastinal contours are within normal limits. No effusion. Visualized bones unremarkable. IMPRESSION: No acute cardiopulmonary disease. Electronically Signed   By: Lucrezia Europe M.D.   On: 06/05/2016 10:23   Echocardiogram 04/25/2016 ------------------------------------------------------------------- LV EF: 60% -   65%  ------------------------------------------------------------------- Indications:      Chest pain 786.51.  ------------------------------------------------------------------- History:   PMH:  Chronic Kidney Disease. Abdominal Pain.  Stroke. Risk factors:  Hypertension. Diabetes mellitus.  ------------------------------------------------------------------- Study Conclusions  - Left ventricle: The cavity size was normal. Wall thickness was    increased in a pattern of mild LVH. Systolic function was normal.   The estimated ejection fraction was in the range of 60% to 65%.   Wall motion was normal; there were no regional wall motion   abnormalities. - Pericardium, extracardiac: A trivial pericardial effusion was   identified.  EKG: Independently reviewed. Vent. rate 126 BPM PR interval * ms QRS duration 121 ms QT/QTc 318/459 ms P-R-T axes 75 -30 64 Sinus tachycardia Atrial premature complex Nonspecific intraventricular conduction delay Borderline T abnormalities, anterior leads  Assessment/Plan Principal Problem:   Diabetic gastroparesis (HCC) Admit to telemetry/observation. Keep nothing by mouth for now. Continue IV hydration. Monitor intake and output. Continue hydromorphone 1 mg every 4 hours as needed  for pain. Metoclopramide 10 mg IVP every 6 hours. Protonix 40 mg IVP every 24 hours. Zofran 4 mg IVP every 6 hours as needed. Consider oral intake trial in a.m. if symptoms are better.  Active Problems:   Chest pain Chest pain free at this time. Continue cardiac monitoring. Trend troponin levels. Recheck EKG in a.m.    Essential hypertension, benign Hold oral antihypertensive medications. Metoprolol and hydralazine IVP while NPO.    Dyslipidemia associated with type 2 diabetes mellitus ([employer]) Hold atorvastatin for now and resume in a.m. If tolerating oral intake. Monitor LFTs and fasting lipid panel as an outpatient.    CKD (chronic kidney disease) stage 3, GFR 30-59 ml/min Creatinine is mildly elevated from baseline due to volume depletion. Monitor intake and output. Continue IV fluids and follow up BUN, creatinine and electrolytes in a.m.    Hypokalemia Replacing. Follow-up potassium level in a.m.    Gout Resume allopurinol and colchicine once cleared for oral intake.   DVT prophylaxis: SCDs. Code Status: Full code. Family Communication:  Disposition Plan: Admit for IV hydration, treatment  with antiemetics and analgesics. Keep nothing by mouth tomorrow morning. Consults called:  Admission status: Observation/telemetry.   Reubin Milan MD [facility] Pager [phone].  If 7PM-7AM, please contact night-coverage [email or web address] Password [email or web address]  06/05/2016, 5:09 PM

## 2016-06-05 NOTE — ED Notes (Signed)
[facility] wanted to wait 15 minutes so they could transfer a patient out of the unit.

## 2016-06-05 NOTE — ED Triage Notes (Signed)
She c/o upper abd/ and chest pain since yesterday evening. She also c/o some shortness of breath. EKG performed at triage.

## 2016-06-05 NOTE — ED Provider Notes (Signed)
[place] DEPT Provider Note   [name]: [id number] Arrival date & time: 06/05/16  0109     History   Chief Complaint Chief Complaint  Patient presents with  . Chest Pain  . Abdominal Pain    HPI Deborah [name] is a 51 y.o. female.  The history is provided by the patient. No language interpreter was used.  Chest Pain   Associated symptoms include abdominal pain.  Abdominal Pain     Deborah [name] is a 51 y.o. female who presents to the Emergency Department complaining of chest pain, abdominal pain.  She has a history of diabetes and gastroparesis. She was in her routine state of health until last night about 11 PM when she developed severe abdominal cramping as well as chest heaviness with multiple episodes of emesis. She denies any fevers, diarrhea, constipation. She does have pain in her low back when she urinates for the last week. She states symptoms are similar to prior flares of her gastroparesis. Her sugars have been running in the upper 200s over the last couple of days, last checked yesterday. She took her last dose of NovoLog 7030 last night at 7 PM with dinner. No possible bad food exposures or sick contacts. Past Medical History:  Diagnosis Date  . Chest pain 12/2015  . Diabetes mellitus   . Fibromyalgia   . Gastric ulcer   . Gastroparesis   . Gout   . Hyperlipidemia   . Hypertension   . Obesity   . Pneumonia   . Stroke [facility]) 02/2011    Patient Active Problem List   Diagnosis Date Noted  . Diabetic gastroparesis ([place]) 06/05/2016  . AKI (acute kidney injury) ([place]) 12/06/2015  . Chest pain 09/26/2015  . Gastroparesis 09/21/2015  . Nausea and vomiting 08/20/2015  . Gout flare 05/27/2015  . Abdominal pain 05/26/2015  . DKA (diabetic ketoacidoses) ([place]) 05/25/2015  . Uncontrolled type 2 diabetes mellitus with diabetic neuropathy, with long-term current use of insulin ([place]) 05/25/2015  . Dyslipidemia associated with type 2 diabetes mellitus ([place])  05/25/2015  . CKD (chronic kidney disease) stage 3, GFR 30-59 ml/min 05/25/2015  . Essential hypertension, benign 09/28/2013    Past Surgical History:  Procedure Laterality Date  . CATARACT EXTRACTION  01/2014  . CHOLECYSTECTOMY      OB History    No data available       Home Medications    Prior to Admission medications   Medication Sig Start Date End Date Taking? Authorizing Provider  albuterol (PROVENTIL HFA;VENTOLIN HFA) 108 (90 Base) MCG/ACT inhaler Inhale 2 puffs into the lungs every 6 (six) hours as needed for wheezing or shortness of breath. 09/23/15  Yes Debbe Odea, MD  allopurinol (ZYLOPRIM) 100 MG tablet Take 1 tablet (100 mg total) by mouth daily. 09/23/15  Yes Debbe Odea, MD  amLODipine (NORVASC) 10 MG tablet Take 1 tablet (10 mg total) by mouth daily. 09/23/15  Yes Debbe Odea, MD  atorvastatin (LIPITOR) 20 MG tablet Take 20 mg by mouth at bedtime.   Yes Historical Provider, MD  colchicine 0.6 MG tablet Take 0.6 mg by mouth daily as needed.  01/31/16  Yes Historical Provider, MD  dicyclomine (BENTYL) 20 MG tablet Take 20 mg by mouth 2 (two) times daily.   Yes Historical Provider, MD  DULoxetine (CYMBALTA) 30 MG capsule Take 1 capsule (30 mg total) by mouth 2 (two) times daily. 09/23/15  Yes Debbe Odea, MD  metoCLOPramide (REGLAN) 10 MG tablet Take 1 tablet (10  mg total) by mouth 3 (three) times daily before meals. 04/29/16  Yes Patrecia Pour, MD  metoprolol succinate (TOPROL-XL) 25 MG 24 hr tablet Take 1 tablet (25 mg total) by mouth daily. 09/23/15  Yes Debbe Odea, MD  nitroGLYCERIN (NITROSTAT) 0.4 MG SL tablet Place 1 tablet (0.4 mg total) under the tongue every 5 (five) minutes as needed for chest pain. 12/20/15  Yes Mauricio Gerome Apley, MD  NOVOLOG MIX 70/30 (70-30) 100 UNIT/ML injection Inject 0.5-0.75 mLs (50-75 Units total) into the skin 3 (three) times daily after meals. 75 units every morning, 60 units with lunch, and 50 units with dinner 09/23/15  Yes Debbe Odea, MD  ondansetron (ZOFRAN ODT) 4 MG disintegrating tablet Take 1 tablet (4 mg total) by mouth every 8 (eight) hours as needed for nausea or vomiting. 04/20/16  Yes Waynetta Pean, PA-C  oxyCODONE (OXY IR/ROXICODONE) 5 MG immediate release tablet Take 1 tablet (5 mg total) by mouth every 8 (eight) hours as needed for severe pain. 04/29/16  Yes Patrecia Pour, MD  pantoprazole (PROTONIX) 40 MG tablet Take 1 tablet (40 mg total) by mouth 2 (two) times daily. 09/23/15  Yes Debbe Odea, MD  ranitidine (ZANTAC) 150 MG tablet Take 150 mg by mouth 2 (two) times daily.   Yes Historical Provider, MD  pravastatin (PRAVACHOL) 10 MG tablet Take 1 tablet (10 mg total) by mouth daily. Patient not taking: Reported on 06/05/2016 09/23/15   Debbe Odea, MD    Family History Family History  Problem Relation Age of Onset  . Diabetes Mother   . Diabetes Father   . Heart disease Father   . Diabetes Sister   . Congestive Heart Failure Sister 20  . Diabetes Brother     Social History Social History  Substance Use Topics  . Smoking status: Never Smoker  . Smokeless tobacco: Never Used  . Alcohol use No     Allergies   Lisinopril; Penicillins; Valium [diazepam]; Aspirin; Food; Nsaids; Tylenol [acetaminophen]; Morphine and related; and Tramadol   Review of Systems Review of Systems  Cardiovascular: Positive for chest pain.  Gastrointestinal: Positive for abdominal pain.  All other systems reviewed and are negative.    Physical Exam Updated Vital Signs BP 120/69 (BP Location: Left Arm)   Pulse (!) 112   Temp 98.4 F (36.9 C) (Oral)   Resp 20   Ht 5\' 6"  (1.676 m)   Wt 290 lb 2 oz (131.6 kg)   LMP 10/10/2012   SpO2 97%   BMI 46.83 kg/m   Physical Exam  Constitutional: She is oriented to person, place, and time. She appears well-developed and well-nourished.  HENT:  Head: Normocephalic and atraumatic.  Cardiovascular: Regular rhythm.   No murmur heard. Tachycardic  Pulmonary/Chest:  Effort normal and breath sounds normal. No respiratory distress.  Abdominal: Soft. There is no rebound and no guarding.  Mild diffuse abdominal tenderness  Musculoskeletal: She exhibits no edema or tenderness.  Neurological: She is alert and oriented to person, place, and time.  Skin: Skin is warm and dry.  Psychiatric: She has a normal mood and affect. Her behavior is normal.  Nursing note and vitals reviewed.    ED Treatments / Results  Labs (all labs ordered are listed, but only abnormal results are displayed) Labs Reviewed  CBC - Abnormal; Notable for the following:       Result Value   WBC 13.8 (*)    Platelets 411 (*)    All other components  within normal limits  COMPREHENSIVE METABOLIC PANEL - Abnormal; Notable for the following:    Sodium 134 (*)    Potassium 3.2 (*)    Chloride 97 (*)    Glucose, Bld 404 (*)    Creatinine, Ser 1.15 (*)    Total Protein 8.6 (*)    GFR calc non Af Amer 54 (*)    All other components within normal limits  URINALYSIS, ROUTINE W REFLEX MICROSCOPIC - Abnormal; Notable for the following:    APPearance CLOUDY (*)    Glucose, UA >=500 (*)    Hgb urine dipstick MODERATE (*)    Protein, ur >=300 (*)    Leukocytes, UA LARGE (*)    Bacteria, UA FEW (*)    Squamous Epithelial / LPF TOO NUMEROUS TO COUNT (*)    All other components within normal limits  CBG MONITORING, ED - Abnormal; Notable for the following:    Glucose-Capillary 426 (*)    All other components within normal limits  CBG MONITORING, ED - Abnormal; Notable for the following:    Glucose-Capillary 363 (*)    All other components within normal limits  LIPASE, BLOOD  I-STAT TROPOININ, ED    EKG  EKG Interpretation  Date/Time:  Saturday June 05 2016 09:46:53 EDT Ventricular Rate:  126 PR Interval:    QRS Duration: 121 QT Interval:  318 QTC Calculation: 459 R Axis:   -30 Text Interpretation:  Sinus tachycardia Atrial premature complex Nonspecific intraventricular  conduction delay Borderline T abnormalities, anterior leads Confirmed by Hazle Coca [phone]) on 06/05/2016 9:57:20 AM       Radiology Dg Chest 2 View  Result Date: 06/05/2016 CLINICAL DATA:  She c/o SOB and chest pain and upper abd pain since yesterday evening. Pt has no other chest complaints. PT Hx: diabetic, HTN, pneumonia EXAM: CHEST - 2 VIEW COMPARISON:  04/25/2016 FINDINGS: Lungs are clear. Heart size and mediastinal contours are within normal limits. No effusion. Visualized bones unremarkable. IMPRESSION: No acute cardiopulmonary disease. Electronically Signed   By: Lucrezia Europe M.D.   On: 06/05/2016 10:23    Procedures Procedures (including critical care time)  Medications Ordered in ED Medications  promethazine (PHENERGAN) injection 12.5 mg (not administered)  sodium chloride 0.9 % bolus 1,000 mL (0 mLs Intravenous Stopped 06/05/16 1320)  metoCLOPramide (REGLAN) injection 10 mg (10 mg Intravenous Given 06/05/16 1044)  HYDROmorphone (DILAUDID) injection 1 mg (1 mg Intravenous Given 06/05/16 1047)  ondansetron (ZOFRAN) injection 4 mg (4 mg Intravenous Given 06/05/16 1048)  lactated ringers bolus 1,000 mL (1,000 mLs Intravenous Transfusing/Transfer 06/05/16 1534)  ondansetron (ZOFRAN) injection 4 mg (4 mg Intravenous Given 06/05/16 1141)  promethazine (PHENERGAN) injection 25 mg (25 mg Intravenous Given 06/05/16 1411)  HYDROmorphone (DILAUDID) injection 1 mg (1 mg Intravenous Given 06/05/16 1607)     Initial Impression / Assessment and Plan / ED Course  I have reviewed the triage vital signs and the nursing notes.  Pertinent labs & imaging results that were available during my care of the patient were reviewed by me and considered in my medical decision making (see chart for details).  Clinical Course as of Jun 05 1652  Sat Jun 05, 2016  1135 On repeat assesment pt feels partially improved with persistent nausea, no vomiting.  She has mild diffuse abdominal tenderness.  HR 117.  [ER]      Clinical Course User Index [ER] Quintella Reichert, MD    Patient with history of diabetes here with vomiting similar to her prior  gastroparesis flares. Patient with persistent tachycardia and nausea in the emergency department. She is dehydrated based on laboratory analysis with elevation creatinine and hemoconcentration. Plan to admit for symptom control.  Final Clinical Impressions(s) / ED Diagnoses   Final diagnoses:  None    New Prescriptions Current Discharge Medication List       Quintella Reichert, MD 06/05/16 1655

## 2016-06-05 NOTE — ED Notes (Signed)
Iv positional. Frequently checking to ensure that the fluids are infusing.

## 2016-06-05 NOTE — ED Notes (Signed)
Water given  

## 2016-06-06 LAB — CBC
HCT: 30 % — ABNORMAL LOW (ref 36.0–46.0)
HEMOGLOBIN: 10.1 g/dL — AB (ref 12.0–15.0)
MCH: 29.4 pg (ref 26.0–34.0)
MCHC: 33.7 g/dL (ref 30.0–36.0)
MCV: 87.2 fL (ref 78.0–100.0)
PLATELETS: 309 10*3/uL (ref 150–400)
RBC: 3.44 MIL/uL — AB (ref 3.87–5.11)
RDW: 13.7 % (ref 11.5–15.5)
WBC: 9.3 10*3/uL (ref 4.0–10.5)

## 2016-06-06 LAB — COMPREHENSIVE METABOLIC PANEL
ALT: 13 U/L — AB (ref 14–54)
AST: 15 U/L (ref 15–41)
Albumin: 3.1 g/dL — ABNORMAL LOW (ref 3.5–5.0)
Alkaline Phosphatase: 80 U/L (ref 38–126)
Anion gap: 9 (ref 5–15)
BUN: 22 mg/dL — AB (ref 6–20)
CHLORIDE: 105 mmol/L (ref 101–111)
CO2: 24 mmol/L (ref 22–32)
CREATININE: 1.69 mg/dL — AB (ref 0.44–1.00)
Calcium: 8.3 mg/dL — ABNORMAL LOW (ref 8.9–10.3)
GFR calc Af Amer: 39 mL/min — ABNORMAL LOW (ref >60)
GFR, EST NON AFRICAN AMERICAN: 34 mL/min — AB (ref >60)
Glucose, Bld: 283 mg/dL — ABNORMAL HIGH (ref 65–99)
Potassium: 3.3 mmol/L — ABNORMAL LOW (ref 3.5–5.1)
Sodium: 138 mmol/L (ref 135–145)
Total Bilirubin: 0.8 mg/dL (ref 0.3–1.2)
Total Protein: 6.7 g/dL (ref 6.5–8.1)

## 2016-06-06 LAB — GLUCOSE, CAPILLARY
Glucose-Capillary: 217 mg/dL — ABNORMAL HIGH (ref 65–99)
Glucose-Capillary: 239 mg/dL — ABNORMAL HIGH (ref 65–99)
Glucose-Capillary: 273 mg/dL — ABNORMAL HIGH (ref 65–99)
Glucose-Capillary: 292 mg/dL — ABNORMAL HIGH (ref 65–99)

## 2016-06-06 LAB — TROPONIN I: Troponin I: <0.03 ng/mL (ref <0.03)

## 2016-06-06 MED ORDER — PROMETHAZINE HCL 25 MG/ML IJ SOLN: 12.5000 mg | Freq: Three times a day (TID) | INTRAMUSCULAR | Status: DC | PRN

## 2016-06-06 MED ORDER — INSULIN GLARGINE 100 UNIT/ML ~~LOC~~ SOLN
35.0000 [IU] | Freq: Every day | SUBCUTANEOUS | Status: DC
  Administered 2016-06-06 – 2016-06-07 (×2): 35 [IU] via SUBCUTANEOUS
  Filled 2016-06-06 (×2): qty 0.35

## 2016-06-06 MED ORDER — HYDROMORPHONE HCL 1 MG/ML IJ SOLN
0.5000 mg | INTRAMUSCULAR | Status: DC | PRN
  Administered 2016-06-06 – 2016-06-07 (×4): 0.5 mg via INTRAVENOUS
  Filled 2016-06-06 (×4): qty 1

## 2016-06-06 NOTE — Progress Notes (Signed)
Deborah [name] XNT:[id number] DOB: 30-Dec-1965 DOA: 06/05/2016 PCP: Nolon Bussing, PA  Brief narrative:  48 ? TY II DM diagnosed 2007 + gastropathy + neuropathy-A1c 12/79.7 HTN HLD Gout Prior CVA + R hemiparesis Morbid Obesity Body mass index is 46.83 kg/m. and OSA--unclear if on CPAP Negative Lexiscan for CP 12/20/15 which was ulitmately felt to be abdominal pain   Recent admission 2/16-2/22/2018-nausea vomiting and gastroparesis  Past medical history-As per Problem list Chart reviewed as below-     Subjective   Alert pleasant Not eating much, mainly drinking No cp No further vomit Thinks can eat Has difficulty getting meds-has had no $ so unableto get reglan at home    Objective     Objective: Vitals:   06/05/16 1640 06/05/16 2207 06/06/16 0300 06/06/16 0626  BP: 120/69 111/72 109/76 123/69  Pulse: (!) 112 (!) 104 (!) 102 (!) 103  Resp: 20 (!) 22 17 20   Temp: 98.4 F (36.9 C) 98.8 F (37.1 C) 99.1 F (37.3 C) 97.8 F (36.6 C)  TempSrc: Oral Oral Oral Oral  SpO2: 97% 94% 97% 95%  Weight: 131.6 kg (290 lb 2 oz)     Height: 5\' 6"  (1.676 m)       Intake/Output Summary (Last 24 hours) at 06/06/16 0714 Last data filed at 06/06/16 0400  Gross per 24 hour  Intake          5727.58 ml  Output                0 ml  Net          5727.58 ml    Exam:  Alert pleasant oriented and in nad Body mass index is 46.83 kg/m.  s1 s 2 tachy abd soft nt nd no rebound no le edema No wheeze and chest is clear    Data Reviewed: Basic Metabolic Panel:  Recent Labs Lab 06/05/16 1046 06/06/16 0231  NA 134* 138  K 3.2* 3.3*  CL 97* 105  CO2 22 24  GLUCOSE 404* 283*  BUN 16 22*  CREATININE 1.15* 1.69*  CALCIUM 9.3 8.3*   Liver Function Tests:  Recent Labs Lab 06/05/16 1046 06/06/16 0231  AST 24 15  ALT 18 13*  ALKPHOS 110 80  BILITOT 1.0 0.8  PROT 8.6* 6.7  ALBUMIN 3.9 3.1*    Recent Labs Lab 06/05/16 1046  LIPASE 23   No results for  input(s): AMMONIA in the last 168 hours. CBC:  Recent Labs Lab 06/05/16 1046 06/06/16 0231  WBC 13.8* 9.3  HGB 12.5 10.1*  HCT 37.1 30.0*  MCV 85.3 87.2  PLT 411* 309   Cardiac Enzymes:  Recent Labs Lab 06/05/16 1740 06/06/16 0001  TROPONINI <0.03 <0.03   BNP: Invalid input(s): POCBNP CBG:  Recent Labs Lab 06/05/16 1043 06/05/16 1316 06/05/16 1804 06/06/16 0007 06/06/16 0624  GLUCAP 426* 363* 289* 239* 273*    No results found for this or any previous visit (from the past 240 hour(s)).   Studies:              All Imaging reviewed and is as per above notation   Scheduled Meds: . insulin aspart  0-15 Units Subcutaneous TID WC  . metoCLOPramide (REGLAN) injection  10 mg Intravenous Q6H  . pantoprazole (PROTONIX) IV  40 mg Intravenous Q24H  . sodium chloride flush  3 mL Intravenous Q12H   Continuous Infusions: . 0.45 % NaCl with KCl 20 mEq / L 125 mL/hr at 06/06/16 0300  Assessment/Plan:   N/v 2/2 to gastroparesis-cont IV reglan, start diet.  have mentioned will need to wean dilaudid as can worsen Gastroparesis 1-->0.5. Monitor for improvement on diet  DM ty II diag 2007-complication neuropathy/gastropathy-monitor need to simplify meds as patient has no $.  Adding lantus 35 U.  Cont SSI---has a very high resistance to insulin and is on large doses [70/30 75, 60 and 50 U] at home.  watching trends and will need to adjust  Prior CVA  -cont Atorvastatin-?not on plavix?  GErd- cont protonic 40 bid, rantac 150 bid.  Body mass index is 46.83 kg/m.   Non cardiac CP-cont Metorprolol xl 25 xl daily.  Cont amlodipine 10 daily  Bipolar-anxiety predominant-cont cymblata 30  Gout-cont Colchicine 0.6 prn  Will need > 24 hrs to resolve.  transfe rto tele given sinus tach and monitor No family  Full code      Verneita Griffes, MD  [facility] Pager [phone] 06/06/2016, 7:14 AM    LOS: 0 days

## 2016-06-07 LAB — BASIC METABOLIC PANEL
ANION GAP: 7 (ref 5–15)
BUN: 22 mg/dL — AB (ref 6–20)
CHLORIDE: 107 mmol/L (ref 101–111)
CO2: 23 mmol/L (ref 22–32)
Calcium: 8.4 mg/dL — ABNORMAL LOW (ref 8.9–10.3)
Creatinine, Ser: 1.29 mg/dL — ABNORMAL HIGH (ref 0.44–1.00)
GFR calc Af Amer: 54 mL/min — ABNORMAL LOW (ref >60)
GFR calc non Af Amer: 47 mL/min — ABNORMAL LOW (ref >60)
GLUCOSE: 287 mg/dL — AB (ref 65–99)
POTASSIUM: 3.4 mmol/L — AB (ref 3.5–5.1)
Sodium: 137 mmol/L (ref 135–145)

## 2016-06-07 LAB — GLUCOSE, CAPILLARY
GLUCOSE-CAPILLARY: 229 mg/dL — AB (ref 65–99)
GLUCOSE-CAPILLARY: 221 mg/dL — AB (ref 65–99)
GLUCOSE-CAPILLARY: 247 mg/dL — AB (ref 65–99)
Glucose-Capillary: 239 mg/dL — ABNORMAL HIGH (ref 65–99)
Glucose-Capillary: 240 mg/dL — ABNORMAL HIGH (ref 65–99)

## 2016-06-07 MED ORDER — SUCRALFATE 1 G PO TABS
1.0000 g | ORAL_TABLET | Freq: Three times a day (TID) | ORAL | Status: DC
  Administered 2016-06-07 – 2016-06-08 (×4): 1 g via ORAL
  Filled 2016-06-07 (×4): qty 1

## 2016-06-07 MED ORDER — RANITIDINE HCL 150 MG/10ML PO SYRP
75.0000 mg | ORAL_SOLUTION | Freq: Two times a day (BID) | ORAL | Status: DC
  Administered 2016-06-07: 75 mg via ORAL
  Filled 2016-06-07 (×3): qty 10

## 2016-06-07 MED ORDER — HYDROMORPHONE HCL 1 MG/ML IJ SOLN: 1.0000 mg | INTRAMUSCULAR | Status: DC | PRN

## 2016-06-07 MED ORDER — OXYCODONE HCL 5 MG PO TABS
5.0000 mg | ORAL_TABLET | ORAL | Status: DC | PRN
  Administered 2016-06-07 (×2): 5 mg via ORAL
  Administered 2016-06-08 (×2): 10 mg via ORAL
  Filled 2016-06-07 (×2): qty 2
  Filled 2016-06-07 (×2): qty 1

## 2016-06-07 MED ORDER — ZOLPIDEM TARTRATE 5 MG PO TABS
5.0000 mg | ORAL_TABLET | Freq: Once | ORAL | Status: AC
  Administered 2016-06-07: 5 mg via ORAL
  Filled 2016-06-07: qty 1

## 2016-06-07 MED ORDER — FAMOTIDINE 40 MG/5ML PO SUSR: 10.0000 mg | Freq: Two times a day (BID) | ORAL | Status: DC

## 2016-06-07 MED ORDER — OXYCODONE-ACETAMINOPHEN 5-325 MG PO TABS: 1.0000 | ORAL_TABLET | ORAL | Status: DC | PRN

## 2016-06-07 MED ORDER — INSULIN GLARGINE 100 UNIT/ML ~~LOC~~ SOLN
42.0000 [IU] | Freq: Every day | SUBCUTANEOUS | Status: DC
Start: 2016-06-08 — End: 2016-06-08
  Administered 2016-06-08: 42 [IU] via SUBCUTANEOUS
  Filled 2016-06-07: qty 0.42

## 2016-06-07 NOTE — Progress Notes (Signed)
Deborah [name] UMP:[id number] DOB: [birth year] DOA: 06/05/2016 PCP: Nolon Bussing, PA  Brief narrative:  30 ? TY II DM diagnosed 2007 + gastropathy + neuropathy-A1c 12/79.7 HTN HLD Gout Prior CVA + R hemiparesis Morbid Obesity Body mass index is 46.83 kg/m. and OSA--unclear if on CPAP Negative Lexiscan for CP 12/20/15 which was ulitmately felt to be abdominal pain   Recent admission 2/16-2/22/2018-nausea vomiting and gastroparesis  Past medical history-As per Problem list Chart reviewed as below-     Subjective   Nausea+ No vomit Lower abd  Uncomfortable No diarr No cp location pain lower , not upper quadrant   Objective     Objective: Vitals:   06/06/16 1639 06/06/16 2103 06/07/16 0453 06/07/16 1331  BP: 118/70 131/78 132/65 138/84  Pulse: (!) 106 (!) 110 (!) 110 98  Resp:  20 20 18   Temp: 98.3 F (36.8 C) 98.6 F (37 C) 98.4 F (36.9 C) 98.4 F (36.9 C)  TempSrc: Oral Oral Oral Oral  SpO2: 100% 97% 96% 100%  Weight:      Height:        Intake/Output Summary (Last 24 hours) at 06/07/16 1337 Last data filed at 06/07/16 0400  Gross per 24 hour  Intake             3840 ml  Output                0 ml  Net             3840 ml    Exam:  Alert pleasant oriented and in nad Body mass index is 46.83 kg/m.  s1 s 2 tachy abd soft nt nd no rebound no le edema No wheeze and chest is clear And soft-non tender--no rebound in upper quadrant nor ternderness    Data Reviewed: Basic Metabolic Panel:  Recent Labs Lab 06/05/16 1046 06/06/16 0231 06/07/16 0435  NA 134* 138 137  K 3.2* 3.3* 3.4*  CL 97* 105 107  CO2 22 24 23   GLUCOSE 404* 283* 287*  BUN 16 22* 22*  CREATININE 1.15* 1.69* 1.29*  CALCIUM 9.3 8.3* 8.4*   Liver Function Tests:  Recent Labs Lab 06/05/16 1046 06/06/16 0231  AST 24 15  ALT 18 13*  ALKPHOS 110 80  BILITOT 1.0 0.8  PROT 8.6* 6.7  ALBUMIN 3.9 3.1*    Recent Labs Lab 06/05/16 1046  LIPASE 23   No  results for input(s): AMMONIA in the last 168 hours. CBC:  Recent Labs Lab 06/05/16 1046 06/06/16 0231  WBC 13.8* 9.3  HGB 12.5 10.1*  HCT 37.1 30.0*  MCV 85.3 87.2  PLT 411* 309   Cardiac Enzymes:  Recent Labs Lab 06/05/16 1740 06/06/16 0001  TROPONINI <0.03 <0.03   BNP: Invalid input(s): POCBNP CBG:  Recent Labs Lab 06/06/16 1215 06/06/16 1743 06/07/16 0005 06/07/16 0615 06/07/16 1148  GLUCAP 217* 292* 239* 240* 229*    No results found for this or any previous visit (from the past 240 hour(s)).   Studies:              All Imaging reviewed and is as per above notation   Scheduled Meds: . insulin aspart  0-15 Units Subcutaneous TID WC  . insulin glargine  35 Units Subcutaneous Daily  . metoCLOPramide (REGLAN) injection  10 mg Intravenous Q6H  . pantoprazole (PROTONIX) IV  40 mg Intravenous Q24H  . sodium chloride flush  3 mL Intravenous Q12H  . sucralfate  1 g  Oral TID WC & HS   Continuous Infusions:    Assessment/Plan:   N/v 2/2 to gastroparesis-cont IV reglan, start diet. D/c dilaudid as can worsen Gastroparesis.  added pepcid and carafate--monitor from improvement-backed down to soft diet Monitor for improvement on diet  DM ty II diag 2007-complication neuropathy/gastropathy-monitor need to simplify meds as patient has no $.  Adding lantus 35 U.  Cont SSI---has a very high resistance to insulin and is on large doses [70/30 75, 60 and 50 U] at home.  watching trends and will need to adjust  Prior CVA  -cont Atorvastatin-?not on plavix?  GErd- cont protonic 40 bid, rantac 150 bid.  Body mass index is 46.83 kg/m.   Non cardiac CP-cont Metorprolol xl 25 xl daily.  Cont amlodipine 10 daily  Bipolar-anxiety predominant-cont cymblata 30  Gout-cont Colchicine 0.6 prn  Will need > 24 hrs to resolve.   No family  Full code      Verneita Griffes, MD  [facility] Pager [phone] 06/07/2016, 1:37 PM    LOS: 1 day

## 2016-06-08 LAB — GLUCOSE, CAPILLARY
Glucose-Capillary: 243 mg/dL — ABNORMAL HIGH (ref 65–99)
Glucose-Capillary: 156 mg/dL — ABNORMAL HIGH (ref 65–99)

## 2016-06-08 MED ORDER — DOXYCYCLINE HYCLATE 100 MG PO TABS: 100.0000 mg | ORAL_TABLET | Freq: Two times a day (BID) | ORAL | 0 refills | Status: DC

## 2016-06-08 MED ORDER — SUCRALFATE 1 G PO TABS: 1.0000 g | ORAL_TABLET | Freq: Three times a day (TID) | ORAL | 0 refills | Status: DC

## 2016-06-08 MED ORDER — PANTOPRAZOLE SODIUM 40 MG PO TBEC: 40.0000 mg | DELAYED_RELEASE_TABLET | Freq: Two times a day (BID) | ORAL | 0 refills | Status: DC

## 2016-06-08 MED ORDER — AMLODIPINE BESYLATE 10 MG PO TABS: 10.0000 mg | ORAL_TABLET | Freq: Every day | ORAL | 0 refills | Status: DC

## 2016-06-08 MED ORDER — METOCLOPRAMIDE HCL 10 MG PO TABS: 10.0000 mg | ORAL_TABLET | Freq: Three times a day (TID) | ORAL | 0 refills | Status: DC

## 2016-06-08 MED ORDER — METOPROLOL SUCCINATE ER 25 MG PO TB24: 25.0000 mg | ORAL_TABLET | Freq: Every day | ORAL | 0 refills | Status: DC

## 2016-06-08 MED ORDER — NOVOLOG MIX 70/30 (70-30) 100 UNIT/ML ~~LOC~~ SUSP: SUBCUTANEOUS | 2 refills | Status: DC

## 2016-06-08 MED ORDER — ONDANSETRON 4 MG PO TBDP: 4.0000 mg | ORAL_TABLET | Freq: Three times a day (TID) | ORAL | 0 refills | Status: DC | PRN

## 2016-06-08 NOTE — Care Management Note (Signed)
Case Management Note  Patient Details  Name: Deborah [name] MRN: [id number] Date of Birth: 1965-09-19  Subjective/Objective: 51 y/o f admitted w/Diabetic gastroparesis. From home.Development worker, community following for [employer]                   Action/Plan:d/c plan home.   Expected Discharge Date:                  Expected Discharge Plan:  Home/Self Care  In-House Referral:     Discharge planning Services  CM Consult  Post Acute Care Choice:    Choice offered to:     DME Arranged:    DME Agency:     HH Arranged:    HH Agency:     Status of Service:  In process, will continue to follow  If discussed at Long Length of Stay Meetings, dates discussed:    Additional Comments:  Dessa Phi, RN 06/08/2016, 11:54 AM

## 2016-06-08 NOTE — Progress Notes (Signed)
Waiting for ride.warm compress given for rt thigh boil.

## 2016-06-08 NOTE — Progress Notes (Signed)
Pt found "knot on my rt thigh"Dr [name] in and assessed w/ this nurse.Will follow

## 2016-06-08 NOTE — Discharge Instructions (Signed)
Keep the boil on your right thigh clean and dry Apply warm compresses multiple times daily Take an oral antibiotic as prescribed by your physician Follow up at the health clinic for a wound check in one week.  If the skin around the bump becomes very red, it becomes larger or more painful, or your develop fevers return to the emergency room for further evaluation and management.    Skin Abscess A skin abscess is an infected area on or under your skin that contains a collection of pus and other material. An abscess may also be called a furuncle, carbuncle, or boil. An abscess can occur in or on almost any part of your body. Some abscesses break open (rupture) on their own. Most continue to get worse unless they are treated. The infection can spread deeper into the body and eventually into your blood, which can make you feel ill. Treatment usually involves draining the abscess. What are the causes? An abscess occurs when germs, often bacteria, pass through your skin and cause an infection. This may be caused by:  A scrape or cut on your skin.  A puncture wound through your skin, including a needle injection.  Blocked oil or sweat glands.  Blocked and infected hair follicles.  A cyst that forms beneath your skin (sebaceous cyst) and becomes infected. What increases the risk? This condition is more likely to develop in people who:  Have a weak body defense system (immune system).  Have diabetes.  Have dry and irritated skin.  Get frequent injections or use illegal IV drugs.  Have a foreign body in a wound, such as a splinter.  Have problems with their lymph system or veins. What are the signs or symptoms? An abscess may start as a painful, firm bump under the skin. Over time, the abscess may get larger or become softer. Pus may appear at the top of the abscess, causing pressure and pain. It may eventually break through the skin and drain. Other symptoms  include:  Redness.  Warmth.  Swelling.  Tenderness.  A sore on the skin. How is this diagnosed? This condition is diagnosed based on your medical history and a physical exam. A sample of pus may be taken from the abscess to find out what is causing the infection and what antibiotics can be used to treat it. You also may have:  Blood tests to look for signs of infection or spread of an infection to your blood.  Imaging studies such as ultrasound, CT scan, or MRI if the abscess is deep. How is this treated? Small abscesses that drain on their own may not need treatment. Treatment for an abscess that does not rupture on its own may include:  Warm compresses applied to the area several times per day.  Incision and drainage. Your health care provider will make an incision to open the abscess and will remove pus and any foreign body or dead tissue. The incision area may be packed with gauze to keep it open for a few days while it heals.  Antibiotic medicines to treat infection. For a severe abscess, you may first get antibiotics through an IV and then change to oral antibiotics. Follow these instructions at home: Abscess Care   If you have an abscess that has not drained, place a warm, clean, wet washcloth over the abscess several times a day. Do this as told by your health care provider.  Follow instructions from your health care provider about how to take care  of your abscess. Make sure you:  Cover the abscess with a bandage (dressing).  Change your dressing or gauze as told by your health care provider.  Wash your hands with soap and water before you change the dressing or gauze. If soap and water are not available, use hand sanitizer.  Check your abscess every day for signs of a worsening infection. Check for:  More redness, swelling, or pain.  More fluid or blood.  Warmth.  More pus or a bad smell. Medicines   Take over-the-counter and prescription medicines only as  told by your health care provider.  If you were prescribed an antibiotic medicine, take it as told by your health care provider. Do not stop taking the antibiotic even if you start to feel better. General instructions   To avoid spreading the infection:  Do not share personal care items, towels, or hot tubs with others.  Avoid making skin contact with other people.  Keep all follow-up visits as told by your health care provider. This is important. Contact a health care provider if:  You have more redness, swelling, or pain around your abscess.  You have more fluid or blood coming from your abscess.  Your abscess feels warm to the touch.  You have more pus or a bad smell coming from your abscess.  You have a fever.  You have muscle aches.  You have chills or a general ill feeling. Get help right away if:  You have severe pain.  You see red streaks on your skin spreading away from the abscess. This information is not intended to replace advice given to you by your health care provider. Make sure you discuss any questions you have with your health care provider. Document Released: 12/02/2004 Document Revised: 10/19/2015 Document Reviewed: 01/01/2015 Elsevier Interactive Patient Education  2017 [employer].

## 2016-06-08 NOTE — Discharge Summary (Signed)
Physician Discharge Summary  Deborah [name]:[id number] DOB: [birth year] DOA: 06/05/2016  PCP: Nolon Bussing, PA  Admit date: 06/05/2016 Discharge date: 06/08/2016  Time spent: 30 minutes  Recommendations for Outpatient Follow-up:  1. Given Rx doxycycline for superficial cellulitis in groin with pimple-needs OP follow up 1 week at her [place] [facility] 2. Refilled multiple meds  Discharge Diagnoses:  Principal Problem:   Diabetic gastroparesis ([place]) Active Problems:   Essential hypertension, benign   Dyslipidemia associated with type 2 diabetes mellitus (HCC)   CKD (chronic kidney disease) stage 3, GFR 30-59 ml/min   Chest pain   Hypokalemia   Gout   Discharge Condition: improved   Diet recommendation: diabetic   Filed Weights   06/05/16 1640  Weight: 131.6 kg (290 lb 2 oz)    History of present illness:   51 ? TY II DM diagnosed 2007 + gastropathy + neuropathy-A1c 12/79.7 HTN HLD Gout Prior CVA + R hemiparesis Morbid Obesity Body mass index is 46.83 kg/m. and OSA--unclear if on CPAP Negative Lexiscan for CP 12/20/15 which was ulitmately felt to be abdominal pain   Recent admission 2/16-2/22/2018-nausea vomiting and gastroparesis   Hospital Course:    N/v 2/2 to gastroparesis-cont IV reglan, start diet. D/c dilaudid as can worsen Gastroparesis.  added pepcid and carafate and given scripts for this as well--improved and tolerating diet on d/c.  No controlled meds on d/c             DM ty II diag 2007-complication neuropathy/gastropathy-monitor need to simplify meds as patient has no $.  Adding lantus 35 U.  Cont SSI---has a very high resistance to insulin and is on large doses [70/30 75, 60 and 50 U] at home which was resumed on d/c             Prior CVA-cont Atorvastatin-?not on plavix?             GErd- cont protonic 40 bid, rantac 150 bid.             Body mass index is 46.83 kg/m.              Non cardiac CP-cont Metorprolol xl 25 xl daily.   Cont amlodipine 10 daily             Bipolar-anxiety predominant-cont cymblata 30             Gout-cont Colchicine 0.6 prn  Groin pimple, superficial cellulitis-seenby gen surgery-doesn't need I& D yet.  gven 5 days doxycycline 100 bid.  Needs OP follow up  Consultations:  Gen surgery  Discharge Exam: Vitals:   06/07/16 2042 06/08/16 0557  BP: 123/80 (!) 140/92  Pulse: (!) 108 (!) 110  Resp: 20 20  Temp: 98.6 F (37 C) 98.9 F (37.2 C)    General: eomi ncat Cardiovascular: s1 s 2no m/r/g Respiratory: clear no added sound Groin has a pimple in L side with some erythema--nonfluctuant  Discharge Instructions   Discharge Instructions    Diet - low sodium heart healthy    Complete by:  As directed    Discharge instructions    Complete by:  As directed    Take doxycycline 100 mg 2 x a day for 5 days-complete the course.  Get seen at your clinic in [place] and have them look at the area in your groin Keep the area dry but also clean with mild soap---if it starts to get larger and moveable, place a warm compress over the area I have  prescribed you with numerous meds and refilled them as scripts.  You can check online at [email or web address] to get them cheaper but may have to shop around for them I would encourage you to not use opiate like meds like percocet or vicodin as this can worsen your gastroparesis   Increase activity slowly    Complete by:  As directed      Current Discharge Medication List    START taking these medications   Details  doxycycline (VIBRA-TABS) 100 MG tablet Take 1 tablet (100 mg total) by mouth 2 (two) times daily. Qty: 10 tablet, Refills: 0    sucralfate (CARAFATE) 1 g tablet Take 1 tablet (1 g total) by mouth 4 (four) times daily -  with meals and at bedtime. Qty: 60 tablet, Refills: 0      CONTINUE these medications which have CHANGED   Details  amLODipine (NORVASC) 10 MG tablet Take 1 tablet (10 mg total) by mouth daily. Qty: 30 tablet, Refills:  0    metoCLOPramide (REGLAN) 10 MG tablet Take 1 tablet (10 mg total) by mouth 3 (three) times daily before meals. Qty: 90 tablet, Refills: 0    metoprolol succinate (TOPROL-XL) 25 MG 24 hr tablet Take 1 tablet (25 mg total) by mouth daily. Qty: 30 tablet, Refills: 0    NOVOLOG MIX 70/30 (70-30) 100 UNIT/ML injection 75 units in am, 60 units  lunch, and 50 units pm Qty: 10 mL, Refills: 2    ondansetron (ZOFRAN ODT) 4 MG disintegrating tablet Take 1 tablet (4 mg total) by mouth every 8 (eight) hours as needed for nausea or vomiting. Qty: 10 tablet, Refills: 0    pantoprazole (PROTONIX) 40 MG tablet Take 1 tablet (40 mg total) by mouth 2 (two) times daily. Qty: 60 tablet, Refills: 0      CONTINUE these medications which have NOT CHANGED   Details  albuterol (PROVENTIL HFA;VENTOLIN HFA) 108 (90 Base) MCG/ACT inhaler Inhale 2 puffs into the lungs every 6 (six) hours as needed for wheezing or shortness of breath. Qty: 1 Inhaler, Refills: 3    allopurinol (ZYLOPRIM) 100 MG tablet Take 1 tablet (100 mg total) by mouth daily. Qty: 30 tablet, Refills: 0    atorvastatin (LIPITOR) 20 MG tablet Take 20 mg by mouth at bedtime.    colchicine 0.6 MG tablet Take 0.6 mg by mouth daily as needed.  Refills: 1    DULoxetine (CYMBALTA) 30 MG capsule Take 1 capsule (30 mg total) by mouth 2 (two) times daily. Qty: 60 capsule, Refills: 3    nitroGLYCERIN (NITROSTAT) 0.4 MG SL tablet Place 1 tablet (0.4 mg total) under the tongue every 5 (five) minutes as needed for chest pain. Qty: 20 tablet, Refills: 0    ranitidine (ZANTAC) 150 MG tablet Take 150 mg by mouth 2 (two) times daily.    pravastatin (PRAVACHOL) 10 MG tablet Take 1 tablet (10 mg total) by mouth daily. Qty: 30 tablet, Refills: 1      STOP taking these medications     dicyclomine (BENTYL) 20 MG tablet      oxyCODONE (OXY IR/ROXICODONE) 5 MG immediate release tablet        Allergies  Allergen Reactions  . Lisinopril Anaphylaxis     Tongue and mouth swelling  . Penicillins Palpitations    Has patient had a PCN reaction causing immediate rash, facial/tongue/throat swelling, SOB or lightheadedness with hypotension: Yes, heart races Has patient had a PCN reaction causing severe rash involving mucus membranes or  skin necrosis: No Has patient had a PCN reaction that required hospitalization: Yes  Has patient had a PCN reaction occurring within the last 10 years: No   . Valium [Diazepam] [name]  . Aspirin Other (See Comments)    Irritates stomach ulcer   . Food Hives and Swelling    Carrots, ketchup   . Nsaids Other (See Comments)    ULCER  . Tylenol [Acetaminophen] Other (See Comments)    Irritates stomach ulcer   . Morphine And Related Palpitations  . Tramadol Nausea And Vomiting      The results of significant diagnostics from this hospitalization (including imaging, microbiology, ancillary and laboratory) are listed below for reference.    Significant Diagnostic Studies: Dg Chest 2 View  Result Date: 06/05/2016 CLINICAL DATA:  She c/o SOB and chest pain and upper abd pain since yesterday evening. Pt has no other chest complaints. PT Hx: diabetic, HTN, pneumonia EXAM: CHEST - 2 VIEW COMPARISON:  04/25/2016 FINDINGS: Lungs are clear. Heart size and mediastinal contours are within normal limits. No effusion. Visualized bones unremarkable. IMPRESSION: No acute cardiopulmonary disease. Electronically Signed   By: Lucrezia Europe M.D.   On: 06/05/2016 10:23    Microbiology: No results found for this or any previous visit (from the past 240 hour(s)).   Labs: Basic Metabolic Panel:  Recent Labs Lab 06/05/16 1046 06/06/16 0231 06/07/16 0435  NA 134* 138 137  K 3.2* 3.3* 3.4*  CL 97* 105 107  CO2 22 24 23   GLUCOSE 404* 283* 287*  BUN 16 22* 22*  CREATININE 1.15* 1.69* 1.29*  CALCIUM 9.3 8.3* 8.4*   Liver Function Tests:  Recent Labs Lab 06/05/16 1046 06/06/16 0231  AST 24 15  ALT 18 13*   ALKPHOS 110 80  BILITOT 1.0 0.8  PROT 8.6* 6.7  ALBUMIN 3.9 3.1*    Recent Labs Lab 06/05/16 1046  LIPASE 23   No results for input(s): AMMONIA in the last 168 hours. CBC:  Recent Labs Lab 06/05/16 1046 06/06/16 0231  WBC 13.8* 9.3  HGB 12.5 10.1*  HCT 37.1 30.0*  MCV 85.3 87.2  PLT 411* 309   Cardiac Enzymes:  Recent Labs Lab 06/05/16 1740 06/06/16 0001  TROPONINI <0.03 <0.03   BNP: BNP (last 3 results) No results for input(s): BNP in the last 8760 hours.  ProBNP (last 3 results) No results for input(s): PROBNP in the last 8760 hours.  CBG:  Recent Labs Lab 06/07/16 1148 06/07/16 1714 06/07/16 2309 06/08/16 0556 06/08/16 1216  GLUCAP 229* 221* 247* 243* 156*       Signed:  Nita Sells MD   [facility] 06/08/2016, 2:29 PM

## 2016-06-08 NOTE — Consult Note (Signed)
[facility] Admission Note  [place] [birth year]  [id number].    Requesting MD: Elton Sin, MD Chief Complaint/Reason for Consult: thigh abscess  HPI:  51 y.o. Female with multiple medical problems including, but not limited to, DM2 w/ associated neuropathy/gastropathy, HTN, HLD, morbid obesity, and PMH CVA who was admitted to [facility] with complications related to gastroparesis including abdominal pain, nausea, and vomiting. During her hospitalization the patient noticed a new "knot" near her right groin and general surgery was asked to evaluate. The patient reports that she noticed a bump on her right medial thigh yesterday. She states that this has never happened to her before. She denies fever, chills, night sweats, warmth, or drainage from the area.   ROS: Review of Systems  Constitutional: Negative for chills and fever.  HENT: Negative.   Eyes: Negative.   Respiratory: Negative for shortness of breath.   Cardiovascular: Negative for chest pain and palpitations.  Gastrointestinal: Positive for abdominal pain, nausea and vomiting.  Genitourinary: Negative.   All other systems reviewed and are negative.   Family History  Problem Relation Age of Onset  . Diabetes Mother   . Diabetes Father   . Heart disease Father   . Diabetes Sister   . Congestive Heart Failure Sister 68  . Diabetes Brother     Past Medical History:  Diagnosis Date  . Chest pain 12/2015  . Diabetes mellitus   . Fibromyalgia   . Gastric ulcer   . Gastroparesis   . Gout   . Hyperlipidemia   . Hypertension   . Obesity   . Pneumonia   . Stroke [facility]) 02/2011    Past Surgical History:  Procedure Laterality Date  . CATARACT EXTRACTION  01/2014  . CHOLECYSTECTOMY      Social History:  reports that she has never smoked. She has never used smokeless tobacco. She reports that she does not drink alcohol or use drugs.  Allergies:  Allergies  Allergen Reactions  . Lisinopril  Anaphylaxis    Tongue and mouth swelling  . Penicillins Palpitations    Has patient had a PCN reaction causing immediate rash, facial/tongue/throat swelling, SOB or lightheadedness with hypotension: Yes, heart races Has patient had a PCN reaction causing severe rash involving mucus membranes or skin necrosis: No Has patient had a PCN reaction that required hospitalization: Yes  Has patient had a PCN reaction occurring within the last 10 years: No   . Valium [Diazepam] [name]  . Aspirin Other (See Comments)    Irritates stomach ulcer   . Food Hives and Swelling    Carrots, ketchup   . Nsaids Other (See Comments)    ULCER  . Tylenol [Acetaminophen] Other (See Comments)    Irritates stomach ulcer   . Morphine And Related Palpitations  . Tramadol Nausea And Vomiting    Medications Prior to Admission  Medication Sig Dispense Refill  . albuterol (PROVENTIL HFA;VENTOLIN HFA) 108 (90 Base) MCG/ACT inhaler Inhale 2 puffs into the lungs every 6 (six) hours as needed for wheezing or shortness of breath. 1 Inhaler 3  . allopurinol (ZYLOPRIM) 100 MG tablet Take 1 tablet (100 mg total) by mouth daily. 30 tablet 0  . amLODipine (NORVASC) 10 MG tablet Take 1 tablet (10 mg total) by mouth daily. 30 tablet 6  . atorvastatin (LIPITOR) 20 MG tablet Take 20 mg by mouth at bedtime.    . colchicine 0.6 MG tablet Take 0.6 mg by mouth daily as needed.  1  . dicyclomine (BENTYL) 20 MG tablet Take 20 mg by mouth 2 (two) times daily.    . DULoxetine (CYMBALTA) 30 MG capsule Take 1 capsule (30 mg total) by mouth 2 (two) times daily. 60 capsule 3  . metoCLOPramide (REGLAN) 10 MG tablet Take 1 tablet (10 mg total) by mouth 3 (three) times daily before meals. 90 tablet 0  . metoprolol succinate (TOPROL-XL) 25 MG 24 hr tablet Take 1 tablet (25 mg total) by mouth daily. 30 tablet 0  . nitroGLYCERIN (NITROSTAT) 0.4 MG SL tablet Place 1 tablet (0.4 mg total) under the tongue every 5 (five) minutes as  needed for chest pain. 20 tablet 0  . NOVOLOG MIX 70/30 (70-30) 100 UNIT/ML injection Inject 0.5-0.75 mLs (50-75 Units total) into the skin 3 (three) times daily after meals. 75 units every morning, 60 units with lunch, and 50 units with dinner 10 mL 2  . ondansetron (ZOFRAN ODT) 4 MG disintegrating tablet Take 1 tablet (4 mg total) by mouth every 8 (eight) hours as needed for nausea or vomiting. 10 tablet 0  . oxyCODONE (OXY IR/ROXICODONE) 5 MG immediate release tablet Take 1 tablet (5 mg total) by mouth every 8 (eight) hours as needed for severe pain. 6 tablet 0  . pantoprazole (PROTONIX) 40 MG tablet Take 1 tablet (40 mg total) by mouth 2 (two) times daily. 60 tablet 0  . ranitidine (ZANTAC) 150 MG tablet Take 150 mg by mouth 2 (two) times daily.    . pravastatin (PRAVACHOL) 10 MG tablet Take 1 tablet (10 mg total) by mouth daily. (Patient not taking: Reported on 06/05/2016) 30 tablet 1    Blood pressure (!) 140/92, pulse (!) 110, temperature 98.9 F (37.2 C), temperature source Oral, resp. rate 20, height 5' 6" (1.676 m), weight 131.6 kg (290 lb 2 oz), last menstrual period 10/10/2012, SpO2 99 %. Physical Exam: Physical Exam  Constitutional: She appears well-developed and well-nourished. No distress.  HENT:  Head: Normocephalic and atraumatic.  Eyes: Conjunctivae are normal. Right eye exhibits no discharge. Left eye exhibits no discharge.  Neck: Normal range of motion. Neck supple.  Cardiovascular: Normal rate and intact distal pulses.   Pulmonary/Chest: Effort normal. No respiratory distress.  Abdominal: Soft.  obese  Musculoskeletal: She exhibits tenderness.  Boil, roughly 1 cm, of right proximal and medial thigh with minimal overlying fluctuance and 3-4 cm induration in the 9 o'clock position. There is no palpable warmth or surrounding cellulitis.   Skin: She is not diaphoretic.    Results for orders placed or performed during the hospital encounter of 06/05/16 (from the past 48  hour(s))  Glucose, capillary     Status: Abnormal   Collection Time: 06/06/16  5:43 PM  Result Value Ref Range   Glucose-Capillary 292 (H) 65 - 99 mg/dL   Comment 1 Notify RN    Comment 2 Document in Chart   Glucose, capillary     Status: Abnormal   Collection Time: 06/07/16 12:05 AM  Result Value Ref Range   Glucose-Capillary 239 (H) 65 - 99 mg/dL  Basic metabolic panel     Status: Abnormal   Collection Time: 06/07/16  4:35 AM  Result Value Ref Range   Sodium 137 135 - 145 mmol/L   Potassium 3.4 (L) 3.5 - 5.1 mmol/L   Chloride 107 101 - 111 mmol/L   CO2 23 22 - 32 mmol/L   Glucose, Bld 287 (H) 65 - 99 mg/dL   BUN 22 (H) 6 -  20 mg/dL   Creatinine, Ser 1.29 (H) 0.44 - 1.00 mg/dL   Calcium 8.4 (L) 8.9 - 10.3 mg/dL   GFR calc non Af Amer 47 (L) >60 mL/min   GFR calc Af Amer 54 (L) >60 mL/min    Comment: (NOTE) The eGFR has been calculated using the CKD EPI equation. This calculation has not been validated in all clinical situations. eGFR's persistently <60 mL/min signify possible Chronic Kidney Disease.    Anion gap 7 5 - 15  Glucose, capillary     Status: Abnormal   Collection Time: 06/07/16  6:15 AM  Result Value Ref Range   Glucose-Capillary 240 (H) 65 - 99 mg/dL  Glucose, capillary     Status: Abnormal   Collection Time: 06/07/16 11:48 AM  Result Value Ref Range   Glucose-Capillary 229 (H) 65 - 99 mg/dL  Glucose, capillary     Status: Abnormal   Collection Time: 06/07/16  5:14 PM  Result Value Ref Range   Glucose-Capillary 221 (H) 65 - 99 mg/dL  Glucose, capillary     Status: Abnormal   Collection Time: 06/07/16 11:09 PM  Result Value Ref Range   Glucose-Capillary 247 (H) 65 - 99 mg/dL  Glucose, capillary     Status: Abnormal   Collection Time: 06/08/16  5:56 AM  Result Value Ref Range   Glucose-Capillary 243 (H) 65 - 99 mg/dL  Glucose, capillary     Status: Abnormal   Collection Time: 06/08/16 12:16 PM  Result Value Ref Range   Glucose-Capillary 156 (H) 65 - 99  mg/dL   No results found.  Assessment/Plan Boil of right thigh - No acute surgical needs. recommend warm compresses, good daily hygiene, and 7-10 days of treatment with an oral antibiotic. Patient should follow up at the health clinic in 1 week for a wound check. I have provided some basic information about abscesses/boils in her discharge information.  Jill Alexanders, [facility] Surgery 06/08/2016, 1:50 PM Pager: [phone] Consults: [phone] Mon-Fri 7:00 am-4:30 pm Sat-Sun 7:00 am-11:30 am

## 2016-06-08 NOTE — Progress Notes (Signed)
D/c'd to home via w/c w/ mother.D/c instructions given w/ verbal understanding.

## 2016-06-08 NOTE — Care Management Note (Signed)
Case Management Note  Patient Details  Name: BHUMI GODBEY MRN: [id number] Date of Birth: November 09, 1965  Subjective/Objective: Patient has pcp, but has no health insurance-provided w/Health insurance info, $4 [employer] med list,needymeds.org website for med patient asst programs, goodrx.com website for discount coupons for meds, affordable care act info for health insurance. Patient states has family to assist w/affording her meds.Will need bus pass @ d/c-CSW notified.  Patient voiced understanding.              Action/Plan:d/c plan home.   Expected Discharge Date:                  Expected Discharge Plan:  Home/Self Care  In-House Referral:  Clinical Social Work  Discharge planning Services  CM Consult, Medication Assistance  Post Acute Care Choice:    Choice offered to:     DME Arranged:    DME Agency:     HH Arranged:    HH Agency:     Status of Service:  In process, will continue to follow  If discussed at Long Length of Stay Meetings, dates discussed:    Additional Comments:  Dessa Phi, RN 06/08/2016, 12:23 PM

## 2016-06-17 ENCOUNTER — Encounter (HOSPITAL_COMMUNITY): Payer: Self-pay | Admitting: Family Medicine

## 2016-06-17 ENCOUNTER — Inpatient Hospital Stay (HOSPITAL_COMMUNITY)
Admission: EM | Admit: 2016-06-17 | Discharge: 2016-06-19 | DRG: 074 | Disposition: A | Discharge status: Home / Self Care | Payer: Self-pay | Attending: Internal Medicine | Admitting: Internal Medicine

## 2016-06-17 ENCOUNTER — Emergency Department (HOSPITAL_COMMUNITY): Payer: Self-pay

## 2016-06-17 DIAGNOSIS — R112 Nausea with vomiting, unspecified: Secondary | ICD-10-CM | POA: Diagnosis present

## 2016-06-17 DIAGNOSIS — E1122 Type 2 diabetes mellitus with diabetic chronic kidney disease: Secondary | ICD-10-CM | POA: Diagnosis present

## 2016-06-17 DIAGNOSIS — Z91018 Allergy to other foods: Secondary | ICD-10-CM

## 2016-06-17 DIAGNOSIS — Z794 Long term (current) use of insulin: Secondary | ICD-10-CM

## 2016-06-17 DIAGNOSIS — E1165 Type 2 diabetes mellitus with hyperglycemia: Secondary | ICD-10-CM

## 2016-06-17 DIAGNOSIS — E669 Obesity, unspecified: Secondary | ICD-10-CM | POA: Diagnosis present

## 2016-06-17 DIAGNOSIS — Z880 Allergy status to penicillin: Secondary | ICD-10-CM

## 2016-06-17 DIAGNOSIS — IMO0002 Reserved for concepts with insufficient information to code with codable children: Secondary | ICD-10-CM

## 2016-06-17 DIAGNOSIS — K3184 Gastroparesis: Secondary | ICD-10-CM | POA: Diagnosis present

## 2016-06-17 DIAGNOSIS — R739 Hyperglycemia, unspecified: Secondary | ICD-10-CM

## 2016-06-17 DIAGNOSIS — L02415 Cutaneous abscess of right lower limb: Secondary | ICD-10-CM | POA: Diagnosis present

## 2016-06-17 DIAGNOSIS — Z9049 Acquired absence of other specified parts of digestive tract: Secondary | ICD-10-CM

## 2016-06-17 DIAGNOSIS — I129 Hypertensive chronic kidney disease with stage 1 through stage 4 chronic kidney disease, or unspecified chronic kidney disease: Secondary | ICD-10-CM | POA: Diagnosis present

## 2016-06-17 DIAGNOSIS — E860 Dehydration: Secondary | ICD-10-CM | POA: Diagnosis present

## 2016-06-17 DIAGNOSIS — Z9849 Cataract extraction status, unspecified eye: Secondary | ICD-10-CM

## 2016-06-17 DIAGNOSIS — N184 Chronic kidney disease, stage 4 (severe): Secondary | ICD-10-CM | POA: Diagnosis present

## 2016-06-17 DIAGNOSIS — M109 Gout, unspecified: Secondary | ICD-10-CM | POA: Diagnosis present

## 2016-06-17 DIAGNOSIS — Z833 Family history of diabetes mellitus: Secondary | ICD-10-CM

## 2016-06-17 DIAGNOSIS — Z6841 Body Mass Index (BMI) 40.0 and over, adult: Secondary | ICD-10-CM

## 2016-06-17 DIAGNOSIS — Z885 Allergy status to narcotic agent status: Secondary | ICD-10-CM

## 2016-06-17 DIAGNOSIS — N1832 Chronic kidney disease, stage 3b: Secondary | ICD-10-CM | POA: Diagnosis present

## 2016-06-17 DIAGNOSIS — M797 Fibromyalgia: Secondary | ICD-10-CM | POA: Diagnosis present

## 2016-06-17 DIAGNOSIS — E876 Hypokalemia: Secondary | ICD-10-CM

## 2016-06-17 DIAGNOSIS — Z8673 Personal history of transient ischemic attack (TIA), and cerebral infarction without residual deficits: Secondary | ICD-10-CM

## 2016-06-17 DIAGNOSIS — I10 Essential (primary) hypertension: Secondary | ICD-10-CM | POA: Diagnosis present

## 2016-06-17 DIAGNOSIS — E1143 Type 2 diabetes mellitus with diabetic autonomic (poly)neuropathy: Principal | ICD-10-CM | POA: Diagnosis present

## 2016-06-17 DIAGNOSIS — Z888 Allergy status to other drugs, medicaments and biological substances status: Secondary | ICD-10-CM

## 2016-06-17 DIAGNOSIS — N182 Chronic kidney disease, stage 2 (mild): Secondary | ICD-10-CM | POA: Diagnosis present

## 2016-06-17 DIAGNOSIS — N179 Acute kidney failure, unspecified: Secondary | ICD-10-CM | POA: Diagnosis present

## 2016-06-17 DIAGNOSIS — R079 Chest pain, unspecified: Secondary | ICD-10-CM | POA: Diagnosis present

## 2016-06-17 DIAGNOSIS — Z8249 Family history of ischemic heart disease and other diseases of the circulatory system: Secondary | ICD-10-CM

## 2016-06-17 DIAGNOSIS — E1140 Type 2 diabetes mellitus with diabetic neuropathy, unspecified: Secondary | ICD-10-CM

## 2016-06-17 DIAGNOSIS — Z886 Allergy status to analgesic agent status: Secondary | ICD-10-CM

## 2016-06-17 LAB — CBC
HEMATOCRIT: 33.6 % — AB (ref 36.0–46.0)
HEMOGLOBIN: 11.2 g/dL — AB (ref 12.0–15.0)
MCH: 28.4 pg (ref 26.0–34.0)
MCHC: 33.3 g/dL (ref 30.0–36.0)
MCV: 85.1 fL (ref 78.0–100.0)
Platelets: 391 10*3/uL (ref 150–400)
RBC: 3.95 MIL/uL (ref 3.87–5.11)
RDW: 13 % (ref 11.5–15.5)
WBC: 9.4 10*3/uL (ref 4.0–10.5)

## 2016-06-17 LAB — BASIC METABOLIC PANEL
ANION GAP: 12 (ref 5–15)
BUN: 13 mg/dL (ref 6–20)
CO2: 25 mmol/L (ref 22–32)
Calcium: 8.2 mg/dL — ABNORMAL LOW (ref 8.9–10.3)
Chloride: 96 mmol/L — ABNORMAL LOW (ref 101–111)
Creatinine, Ser: 1.42 mg/dL — ABNORMAL HIGH (ref 0.44–1.00)
GFR calc Af Amer: 48 mL/min — ABNORMAL LOW (ref >60)
GFR, EST NON AFRICAN AMERICAN: 42 mL/min — AB (ref >60)
Glucose, Bld: 374 mg/dL — ABNORMAL HIGH (ref 65–99)
POTASSIUM: 2.9 mmol/L — AB (ref 3.5–5.1)
SODIUM: 133 mmol/L — AB (ref 135–145)

## 2016-06-17 LAB — LIPASE, BLOOD: LIPASE: 19 U/L (ref 11–51)

## 2016-06-17 LAB — GLUCOSE, CAPILLARY: Glucose-Capillary: 298 mg/dL — ABNORMAL HIGH (ref 65–99)

## 2016-06-17 LAB — I-STAT TROPONIN, ED: Troponin i, poc: 0.01 ng/mL (ref 0.00–0.08)

## 2016-06-17 MED ORDER — SODIUM CHLORIDE 0.9% FLUSH
3.0000 mL | Freq: Two times a day (BID) | INTRAVENOUS | Status: DC
  Administered 2016-06-19: 3 mL via INTRAVENOUS

## 2016-06-17 MED ORDER — NITROGLYCERIN 0.4 MG SL SUBL: 0.4000 mg | SUBLINGUAL_TABLET | SUBLINGUAL | Status: DC | PRN

## 2016-06-17 MED ORDER — BISACODYL 5 MG PO TBEC: 5.0000 mg | DELAYED_RELEASE_TABLET | Freq: Every day | ORAL | Status: DC | PRN

## 2016-06-17 MED ORDER — ATORVASTATIN CALCIUM 20 MG PO TABS
20.0000 mg | ORAL_TABLET | Freq: Every day | ORAL | Status: DC
  Filled 2016-06-17: qty 1

## 2016-06-17 MED ORDER — ALBUTEROL SULFATE (2.5 MG/3ML) 0.083% IN NEBU: 2.5000 mg | INHALATION_SOLUTION | Freq: Four times a day (QID) | RESPIRATORY_TRACT | Status: DC | PRN

## 2016-06-17 MED ORDER — ENOXAPARIN SODIUM 60 MG/0.6ML ~~LOC~~ SOLN
60.0000 mg | Freq: Every day | SUBCUTANEOUS | Status: DC
  Administered 2016-06-17 – 2016-06-18 (×2): 60 mg via SUBCUTANEOUS
  Filled 2016-06-17 (×2): qty 0.6

## 2016-06-17 MED ORDER — POLYETHYLENE GLYCOL 3350 17 G PO PACK: 17.0000 g | PACK | Freq: Every day | ORAL | Status: DC | PRN

## 2016-06-17 MED ORDER — DULOXETINE HCL 30 MG PO CPEP
30.0000 mg | ORAL_CAPSULE | Freq: Two times a day (BID) | ORAL | Status: DC
  Administered 2016-06-18 – 2016-06-19 (×3): 30 mg via ORAL
  Filled 2016-06-17 (×3): qty 1

## 2016-06-17 MED ORDER — ONDANSETRON HCL 4 MG/2ML IJ SOLN
4.0000 mg | Freq: Four times a day (QID) | INTRAMUSCULAR | Status: DC | PRN
  Administered 2016-06-18 – 2016-06-19 (×2): 4 mg via INTRAVENOUS
  Filled 2016-06-17 (×2): qty 2

## 2016-06-17 MED ORDER — MAGNESIUM SULFATE 2 GM/50ML IV SOLN
2.0000 g | Freq: Once | INTRAVENOUS | Status: AC
  Administered 2016-06-17: 2 g via INTRAVENOUS

## 2016-06-17 MED ORDER — FENTANYL CITRATE (PF) 100 MCG/2ML IJ SOLN
100.0000 ug | Freq: Once | INTRAMUSCULAR | Status: AC
  Administered 2016-06-17: 100 ug via INTRAVENOUS
  Filled 2016-06-17: qty 2

## 2016-06-17 MED ORDER — LIDOCAINE-EPINEPHRINE (PF) 2 %-1:200000 IJ SOLN
10.0000 mL | Freq: Once | INTRAMUSCULAR | Status: AC
  Administered 2016-06-17: 10 mL
  Filled 2016-06-17: qty 20

## 2016-06-17 MED ORDER — HALOPERIDOL LACTATE 5 MG/ML IJ SOLN
2.0000 mg | Freq: Once | INTRAMUSCULAR | Status: AC
  Administered 2016-06-17: 2 mg via INTRAVENOUS
  Filled 2016-06-17: qty 1

## 2016-06-17 MED ORDER — FAMOTIDINE IN NACL 20-0.9 MG/50ML-% IV SOLN
20.0000 mg | Freq: Once | INTRAVENOUS | Status: AC
  Administered 2016-06-17: 20 mg via INTRAVENOUS
  Filled 2016-06-17: qty 50

## 2016-06-17 MED ORDER — ALLOPURINOL 100 MG PO TABS
100.0000 mg | ORAL_TABLET | Freq: Every day | ORAL | Status: DC
  Administered 2016-06-18 – 2016-06-19 (×2): 100 mg via ORAL
  Filled 2016-06-17 (×2): qty 1

## 2016-06-17 MED ORDER — INSULIN GLARGINE 100 UNIT/ML ~~LOC~~ SOLN
80.0000 [IU] | Freq: Every day | SUBCUTANEOUS | Status: DC
  Administered 2016-06-17: 80 [IU] via SUBCUTANEOUS
  Filled 2016-06-17 (×3): qty 0.8

## 2016-06-17 MED ORDER — SUCRALFATE 1 G PO TABS
1.0000 g | ORAL_TABLET | Freq: Three times a day (TID) | ORAL | Status: DC
  Administered 2016-06-18 – 2016-06-19 (×3): 1 g via ORAL
  Filled 2016-06-17 (×5): qty 1

## 2016-06-17 MED ORDER — POTASSIUM CHLORIDE IN NACL 20-0.9 MEQ/L-% IV SOLN
INTRAVENOUS | Status: AC
  Administered 2016-06-17: via INTRAVENOUS
  Filled 2016-06-17 (×2): qty 1000

## 2016-06-17 MED ORDER — SODIUM CHLORIDE 0.9 % IV BOLUS (SEPSIS)
1000.0000 mL | Freq: Once | INTRAVENOUS | Status: AC
  Administered 2016-06-17: 1000 mL via INTRAVENOUS

## 2016-06-17 MED ORDER — ONDANSETRON HCL 4 MG PO TABS: 4.0000 mg | ORAL_TABLET | Freq: Four times a day (QID) | ORAL | Status: DC | PRN

## 2016-06-17 MED ORDER — ALBUTEROL SULFATE HFA 108 (90 BASE) MCG/ACT IN AERS
2.0000 | INHALATION_SPRAY | Freq: Four times a day (QID) | RESPIRATORY_TRACT | Status: DC | PRN
Start: 2016-06-17 — End: 2016-06-17

## 2016-06-17 MED ORDER — INSULIN ASPART 100 UNIT/ML ~~LOC~~ SOLN
0.0000 [IU] | SUBCUTANEOUS | Status: DC
  Administered 2016-06-18 (×2): 7 [IU] via SUBCUTANEOUS
  Administered 2016-06-18: 3 [IU] via SUBCUTANEOUS
  Administered 2016-06-18 – 2016-06-19 (×4): 4 [IU] via SUBCUTANEOUS
  Administered 2016-06-19: 7 [IU] via SUBCUTANEOUS

## 2016-06-17 MED ORDER — PROMETHAZINE HCL 25 MG/ML IJ SOLN
25.0000 mg | Freq: Four times a day (QID) | INTRAMUSCULAR | Status: DC | PRN
Start: 2016-06-17 — End: 2016-06-19

## 2016-06-17 MED ORDER — INSULIN ASPART 100 UNIT/ML ~~LOC~~ SOLN
8.0000 [IU] | SUBCUTANEOUS | Status: DC
  Administered 2016-06-18: 8 [IU] via SUBCUTANEOUS

## 2016-06-17 MED ORDER — METOCLOPRAMIDE HCL 5 MG/ML IJ SOLN
10.0000 mg | Freq: Three times a day (TID) | INTRAMUSCULAR | Status: DC
  Administered 2016-06-17 – 2016-06-18 (×2): 10 mg via INTRAVENOUS
  Filled 2016-06-17 (×2): qty 2

## 2016-06-17 MED ORDER — ATORVASTATIN CALCIUM 10 MG PO TABS
20.0000 mg | ORAL_TABLET | Freq: Every day | ORAL | Status: DC
  Filled 2016-06-17: qty 2

## 2016-06-17 MED ORDER — AMLODIPINE BESYLATE 5 MG PO TABS
10.0000 mg | ORAL_TABLET | Freq: Every day | ORAL | Status: DC
  Administered 2016-06-18 – 2016-06-19 (×2): 10 mg via ORAL
  Filled 2016-06-17 (×2): qty 2

## 2016-06-17 MED ORDER — ONDANSETRON HCL 4 MG/2ML IJ SOLN
4.0000 mg | Freq: Once | INTRAMUSCULAR | Status: AC
  Administered 2016-06-17: 4 mg via INTRAVENOUS
  Filled 2016-06-17: qty 2

## 2016-06-17 MED ORDER — PANTOPRAZOLE SODIUM 40 MG PO TBEC
40.0000 mg | DELAYED_RELEASE_TABLET | Freq: Two times a day (BID) | ORAL | Status: DC
  Administered 2016-06-18 – 2016-06-19 (×3): 40 mg via ORAL
  Filled 2016-06-17 (×3): qty 1

## 2016-06-17 MED ORDER — SODIUM CHLORIDE 0.9 % IV SOLN
30.0000 meq | Freq: Once | INTRAVENOUS | Status: AC
  Administered 2016-06-17: 30 meq via INTRAVENOUS
  Filled 2016-06-17: qty 15

## 2016-06-17 MED ORDER — METOPROLOL SUCCINATE ER 25 MG PO TB24
25.0000 mg | ORAL_TABLET | Freq: Every day | ORAL | Status: DC
  Administered 2016-06-19: 25 mg via ORAL
  Filled 2016-06-17 (×2): qty 1

## 2016-06-17 MED ORDER — OXYCODONE HCL 5 MG PO TABS
5.0000 mg | ORAL_TABLET | Freq: Once | ORAL | Status: AC
  Administered 2016-06-17: 5 mg via ORAL
  Filled 2016-06-17: qty 1

## 2016-06-17 NOTE — ED Provider Notes (Signed)
[name] DEPT Provider Note   [name]: [id number] Arrival date & time: 06/17/16  1355     History   Chief Complaint Chief Complaint  Patient presents with  . Abdominal Pain  . [name]    HPI Deborah [name] is a 51 y.o. female.  Deborah [name] is a 51 y.o. Female with a history of diabetes, gastroparesis, and fibromyalgia who presents to the ED complaining of nausea and vomiting with her typical gastroparesis symptoms. Patient reports pain from her low albumin extending up into her chest with some associated SOB. She reports is typical for her gastroparesis. She reports symptoms of acid reflux as well. She reports her symptoms began about 3 days ago. This happens frequently and she has to come to the emergency department for this to get better. She also complains of a lump to her right inner thigh. No discharge from this area. She reports is been ongoing for about 3 days as well. She denies any hematemesis. No diarrhea. Previous abdominal surgical history includes a cholecystectomy. She is passing gas. She denies fevers, hematemesis, hematochezia, rashes, urinary symptoms.    The history is provided by the patient, medical records and a parent. No language interpreter was used.  Abdominal Pain   Associated symptoms include nausea and vomiting. Pertinent negatives include fever, diarrhea, dysuria and headaches.  [name]  Associated symptoms include chest pain, vomiting, abdominal pain and rash. Pertinent negatives include no fever, no headaches, no sore throat, no neck pain, no cough, no wheezing and no leg swelling.    Past Medical History:  Diagnosis Date  . Chest pain 12/2015  . Diabetes mellitus   . Fibromyalgia   . Gastric ulcer   . Gastroparesis   . Gout   . Hyperlipidemia   . Hypertension   . Obesity   . Pneumonia   . Stroke [facility]) 02/2011    Patient Active Problem List   Diagnosis Date Noted  . Intractable nausea and vomiting  06/17/2016  . Diabetic gastroparesis ([place]) 06/05/2016  . Gout 06/05/2016  . AKI (acute kidney injury) ([place]) 12/06/2015  . Chest pain 09/26/2015  . Hypokalemia 09/26/2015  . Gastroparesis 09/21/2015  . Nausea and vomiting 08/20/2015  . Gout flare 05/27/2015  . Abdominal pain 05/26/2015  . DKA (diabetic ketoacidoses) ([place]) 05/25/2015  . Uncontrolled type 2 diabetes mellitus with diabetic neuropathy, with long-term current use of insulin ([place]) 05/25/2015  . Dyslipidemia associated with type 2 diabetes mellitus ([place]) 05/25/2015  . CKD (chronic kidney disease), stage II 05/25/2015  . Essential hypertension, benign 09/28/2013    Past Surgical History:  Procedure Laterality Date  . CATARACT EXTRACTION  01/2014  . CHOLECYSTECTOMY      OB History    No data available       Home Medications    Prior to Admission medications   Medication Sig Start Date End Date Taking? Authorizing Provider  allopurinol (ZYLOPRIM) 100 MG tablet Take 1 tablet (100 mg total) by mouth daily. 09/23/15  Yes Debbe Odea, MD  amLODipine (NORVASC) 10 MG tablet Take 1 tablet (10 mg total) by mouth daily. 06/08/16  Yes Nita Sells, MD  atorvastatin (LIPITOR) 20 MG tablet Take 20 mg by mouth at bedtime.   Yes Historical Provider, MD  colchicine 0.6 MG tablet Take 0.6 mg by mouth daily as needed (gout).  01/31/16  Yes Historical Provider, MD  DULoxetine (CYMBALTA) 30 MG capsule Take 1 capsule (30 mg total) by mouth 2 (two) times daily.  09/23/15  Yes Debbe Odea, MD  metoCLOPramide (REGLAN) 10 MG tablet Take 1 tablet (10 mg total) by mouth 3 (three) times daily before meals. 06/08/16  Yes Nita Sells, MD  metoprolol succinate (TOPROL-XL) 25 MG 24 hr tablet Take 1 tablet (25 mg total) by mouth daily. 06/08/16  Yes Nita Sells, MD  NOVOLOG MIX 70/30 (70-30) 100 UNIT/ML injection 75 units in am, 60 units  lunch, and 50 units pm 06/08/16  Yes Nita Sells, MD  ondansetron (ZOFRAN ODT) 4 MG  disintegrating tablet Take 1 tablet (4 mg total) by mouth every 8 (eight) hours as needed for nausea or vomiting. 06/08/16  Yes Nita Sells, MD  pantoprazole (PROTONIX) 40 MG tablet Take 1 tablet (40 mg total) by mouth 2 (two) times daily. 06/08/16  Yes Nita Sells, MD  sucralfate (CARAFATE) 1 g tablet Take 1 tablet (1 g total) by mouth 4 (four) times daily -  with meals and at bedtime. 06/08/16  Yes Nita Sells, MD  albuterol (PROVENTIL HFA;VENTOLIN HFA) 108 (90 Base) MCG/ACT inhaler Inhale 2 puffs into the lungs every 6 (six) hours as needed for wheezing or shortness of breath. 09/23/15   Debbe Odea, MD  nitroGLYCERIN (NITROSTAT) 0.4 MG SL tablet Place 1 tablet (0.4 mg total) under the tongue every 5 (five) minutes as needed for chest pain. 12/20/15   Mauricio Gerome Apley, MD    Family History Family History  Problem Relation Age of Onset  . Diabetes Mother   . Diabetes Father   . Heart disease Father   . Diabetes Sister   . Congestive Heart Failure Sister 52  . Diabetes Brother     Social History Social History  Substance Use Topics  . Smoking status: Never Smoker  . Smokeless tobacco: Never Used  . Alcohol use No     Allergies   Lisinopril; Penicillins; Valium [diazepam]; Aspirin; Food; Nsaids; Tylenol [acetaminophen]; Morphine and related; and Tramadol   Review of Systems Review of Systems  Constitutional: Negative for chills and fever.  HENT: Negative for congestion and sore throat.   Eyes: Negative for visual disturbance.  Respiratory: Positive for shortness of breath. Negative for cough and wheezing.   Cardiovascular: Positive for chest pain. Negative for palpitations and leg swelling.  Gastrointestinal: Positive for abdominal pain, nausea and vomiting. Negative for abdominal distention, blood in stool and diarrhea.  Genitourinary: Negative for dysuria.  Musculoskeletal: Negative for back pain and neck pain.  Skin: Positive for rash.    Neurological: Negative for syncope, light-headedness and headaches.     Physical Exam Updated Vital Signs BP (!) 134/94 (BP Location: Left Wrist)   Pulse 97   Temp 98.1 F (36.7 C) (Oral)   Resp 18   Ht 5\' 6"  (1.676 m)   Wt 131.6 kg   LMP 10/10/2012   SpO2 98%   BMI 46.83 kg/m   Physical Exam  Constitutional: She appears well-developed and well-nourished. No distress.  Nontoxic appearing. Obese female.  HENT:  Head: Normocephalic and atraumatic.  Mouth/Throat: Oropharynx is clear and moist.  Eyes: Conjunctivae are normal. Pupils are equal, round, and reactive to light. Right eye exhibits no discharge. Left eye exhibits no discharge.  Neck: Neck supple.  Cardiovascular: Regular rhythm, normal heart sounds and intact distal pulses.  Exam reveals no gallop and no friction rub.   No murmur heard. Heart rate is 118.  Pulmonary/Chest: Effort normal and breath sounds normal. No respiratory distress. She has no wheezes. She has no rales.  Abdominal: Soft.  Bowel sounds are normal. She exhibits no distension and no mass. There is tenderness. There is no guarding.  Abdomen soft. Bowel sounds are present. Patient has mild generalized abdominal tenderness palpation without focal tenderness.  Musculoskeletal: She exhibits no edema or tenderness.  Lymphadenopathy:    She has no cervical adenopathy.  Neurological: She is alert. Coordination normal.  Skin: Skin is warm and dry. Capillary refill takes less than 2 seconds. No rash noted. She is not diaphoretic. No erythema. No pallor.  Small indurated and fluctuant abscess noted to her right upper inner thigh. No discharge. No surrounding erythema or edema.  Psychiatric: She has a normal mood and affect. Her behavior is normal.  Nursing note and vitals reviewed.    ED Treatments / Results  Labs (all labs ordered are listed, but only abnormal results are displayed) Labs Reviewed  BASIC METABOLIC PANEL - Abnormal; Notable for the  following:       Result Value   Sodium 133 (*)    Potassium 2.9 (*)    Chloride 96 (*)    Glucose, Bld 374 (*)    Creatinine, Ser 1.42 (*)    Calcium 8.2 (*)    GFR calc non Af Amer 42 (*)    GFR calc Af Amer 48 (*)    All other components within normal limits  CBC - Abnormal; Notable for the following:    Hemoglobin 11.2 (*)    HCT 33.6 (*)    All other components within normal limits  LIPASE, BLOOD  URINALYSIS, ROUTINE W REFLEX MICROSCOPIC  MAGNESIUM  BASIC METABOLIC PANEL  I-STAT TROPOININ, ED    EKG  EKG Interpretation  Date/Time:  Thursday June 17 2016 14:11:14 EDT Ventricular Rate:  119 PR Interval:    QRS Duration: 88 QT Interval:  332 QTC Calculation: 468 R Axis:   -21 Text Interpretation:  Sinus tachycardia Atrial premature complex Borderline left axis deviation No STEMI.  Confirmed by LONG MD, JOSHUA [phone]) on 06/17/2016 11:13:59 PM       Radiology Dg Chest 2 View  Result Date: 06/17/2016 CLINICAL DATA:  Chest pain. EXAM: CHEST  2 VIEW COMPARISON:  Radiographs of June 05, 2016. FINDINGS: The heart size and mediastinal contours are within normal limits. Both lungs are clear. No pneumothorax or pleural effusion is noted. The visualized skeletal structures are unremarkable. IMPRESSION: No active cardiopulmonary disease. Electronically Signed   By: Marijo Conception, M.D.   On: 06/17/2016 14:47    Procedures .Marland KitchenIncision and Drainage Date/Time: 06/17/2016 9:14 PM Performed by: Waynetta Pean Authorized by: Waynetta Pean   Consent:    Consent obtained:  Verbal   Consent given by:  Patient   Risks discussed:  Bleeding, incomplete drainage, pain and infection   Alternatives discussed:  No treatment and alternative treatment Location:    Type:  Abscess   Size:  4 cm   Location:  Lower extremity   Lower extremity location:  Leg   Leg location:  R upper leg Pre-procedure details:    Skin preparation:  Betadine Anesthesia (see MAR for exact dosages):     Anesthesia method:  Local infiltration   Local anesthetic:  Lidocaine 1% WITH epi Procedure type:    Complexity:  Complex Procedure details:    Needle aspiration: no     Incision types:  Single straight   Incision depth:  Dermal   Scalpel blade:  11   Wound management:  Probed and deloculated, irrigated with saline and extensive cleaning  Drainage:  Purulent   Drainage amount:  Copious   Wound treatment:  Wound left open   Packing materials:  None Post-procedure details:    Patient tolerance of procedure:  Tolerated well, no immediate complications   (including critical care time)  Medications Ordered in ED Medications  amLODipine (NORVASC) tablet 10 mg (not administered)  atorvastatin (LIPITOR) tablet 20 mg (not administered)  metoprolol succinate (TOPROL-XL) 24 hr tablet 25 mg (not administered)  sucralfate (CARAFATE) tablet 1 g (not administered)  nitroGLYCERIN (NITROSTAT) SL tablet 0.4 mg (not administered)  allopurinol (ZYLOPRIM) tablet 100 mg (not administered)  DULoxetine (CYMBALTA) DR capsule 30 mg (not administered)  pantoprazole (PROTONIX) EC tablet 40 mg (not administered)  insulin glargine (LANTUS) injection 80 Units (not administered)  insulin aspart (novoLOG) injection 0-20 Units (not administered)  insulin aspart (novoLOG) injection 8 Units (not administered)  enoxaparin (LOVENOX) injection 60 mg (not administered)  sodium chloride flush (NS) 0.9 % injection 3 mL (not administered)  0.9 % NaCl with KCl 20 mEq/ L  infusion (not administered)  polyethylene glycol (MIRALAX / GLYCOLAX) packet 17 g (not administered)  bisacodyl (DULCOLAX) EC tablet 5 mg (not administered)  ondansetron (ZOFRAN) tablet 4 mg (not administered)    Or  ondansetron (ZOFRAN) injection 4 mg (not administered)  promethazine (PHENERGAN) injection 25 mg (not administered)  metoCLOPramide (REGLAN) injection 10 mg (not administered)  magnesium sulfate IVPB 2 g 50 mL (not administered)    albuterol (PROVENTIL) (2.5 MG/3ML) 0.083% nebulizer solution 2.5 mg (not administered)  sodium chloride 0.9 % bolus 1,000 mL (0 mLs Intravenous Stopped 06/17/16 2148)  ondansetron (ZOFRAN) injection 4 mg (4 mg Intravenous Given 06/17/16 1817)  famotidine (PEPCID) IVPB 20 mg premix (0 mg Intravenous Stopped 06/17/16 2002)  potassium chloride 30 mEq in sodium chloride 0.9 % 265 mL (KCL MULTIRUN) IVPB (30 mEq Intravenous Given 06/17/16 1800)  lidocaine-EPINEPHrine (XYLOCAINE W/EPI) 2 %-1:200000 (PF) injection 10 mL (10 mLs Infiltration Given 06/17/16 1820)  fentaNYL (SUBLIMAZE) injection 100 mcg (100 mcg Intravenous Given 06/17/16 1817)  haloperidol lactate (HALDOL) injection 2 mg (2 mg Intravenous Given 06/17/16 2040)  sodium chloride 0.9 % bolus 1,000 mL (1,000 mLs Intravenous New Bag/Given 06/17/16 2130)     Initial Impression / Assessment and Plan / ED Course  I have reviewed the triage vital signs and the nursing notes.  Pertinent labs & imaging results that were available during my care of the patient were reviewed by me and considered in my medical decision making (see chart for details).    This is a 51 y.o. Female with a history of diabetes, gastroparesis, and fibromyalgia who presents to the ED complaining of nausea and vomiting with her typical gastroparesis symptoms. Patient reports pain from her low albumin extending up into her chest with some associated SOB. She reports is typical for her gastroparesis. She reports symptoms of acid reflux as well. She reports her symptoms began about 3 days ago. This happens frequently and she has to come to the emergency department for this to get better. She also complains of a lump to her right inner thigh. On exam the patient is afebrile and non-toxic appearing. She is mild generalized abdominal tenderness to palpation. No focal tenderness. She also has a fluctuant abscess to her right medial inner thigh. No discharge from this area. X-ray of her chest  is unremarkable. EKG shows sinus tachycardia. No evidence of STEMI. Troponin is not elevated. I have low suspicion for ACS in this patient. I suspect her complaint  of pain in her chest is related to acid reflux. Patient agrees. This feels better after Pepcid. BMP is immobile for potassium of 2.9. Creatinine is elevated 1.42. CBC shows no leukocytosis. Lipase is within normal limits.  Patient reported feeling better after Pepcid and Zofran. She didn't tolerated some water and then vomited this back up. Patient then had Haldol which she said made her feel much better she and she drank quite a bit of water which made her vomit. Repeat abdominal exam is benign. I am not concerned for intraabdominal pathology at this time. Suspect her typical gastroparesis. Will admit for intractable vomiting and dehydration. Incision and drainage of her abscess was performed by me and tolerated well by the patient. Discussed abscess care and precautions. No antibiotics at this time.   I consulted with hospitalist Dr. Myna Hidalgo who accepted the patient for admission.     Final Clinical Impressions(s) / ED Diagnoses   Final diagnoses:  Intractable vomiting with nausea, unspecified vomiting type  Hypokalemia  Gastroparesis  Dehydration  Hyperglycemia  Abscess of right leg    New Prescriptions Current Discharge Medication List       Waynetta Pean, PA-C 06/17/16 [place], MD 06/18/16 1013

## 2016-06-17 NOTE — ED Notes (Signed)
Patient stated being in pain, informed RN Lanelle Bal

## 2016-06-17 NOTE — ED Triage Notes (Signed)
Pt c/o low abdominal pain, chest pain, SOB, nausea, emesis x several days. Worse with exertion. No diarrhea. Pt states symptoms similar to her usual  Gastroparesis exacerbations.

## 2016-06-17 NOTE — H&P (Signed)
History and Physical    Deborah [name] ZNB:[id number] DOB: 11-29-1965 DOA: 06/17/2016  PCP: Nolon Bussing, PA   Patient coming from: Home  Chief Complaint: Abdominal pain, N/V   HPI: Deborah [name] is a 51 y.o. female with medical history significant for insulin-dependent diabetes mellitus, diabetic gastroparesis, hypertension, GERD, and history of stroke who presents to the emergency department with 3 days of lower abdominal pain and nausea with nonbloody nonbilious vomiting. Patient reports that she had been in her usual state of health until approximately 3 days ago when she noted the insidious development of pain in the lower abdomen with nausea. Since that time, the pain has worsened significantly and she has experienced persistent nausea and vomiting. Pain is described as severe, constant, primarily in the lower quadrants, sharp in character, and worse with oral intake. Patient denies any fevers or chills. She denies chest pain per se, but notes indigestion with belching and a burning sensation in the central chest. She describes her symptoms as very similar to her prior experiences with gastroparesis. She has not attempted any interventions prior to coming in. She denies any diarrhea, hematochezia, or melena.  ED Course: Upon arrival to the ED, patient is found to be afebrile, saturating well on room air, tachycardic in the 120s, and with stable blood pressure. EKG features a sinus tachycardia with rate 119 and PACs. Chest x-ray is negative for acute cardiopulmonary disease. Chemistry panel reveals a sodium of 133, potassium 2.9, and serum creatinine 1.42, up from an apparent baseline of 0.8. Hemoglobin is stable at 11.2 and troponin is undetectable. Lipase is within normal limits. Patient was given 2 L normal saline and treated with Zofran, Phenergan, and Haldol in the emergency department. She was also given Pepcid, fentanyl, and 30 mEq IV potassium. Despite these measures in  the ED, the patient has continued to be intolerant of food or drink and will be observed on the telemetry unit for ongoing evaluation and management of abdominal pain with intractable nausea and vomiting and subsequent dehydration and AKI, suspected secondary to diabetic gastroparesis.  Review of Systems:  All other systems reviewed and apart from HPI, are negative.  Past Medical History:  Diagnosis Date  . Chest pain 12/2015  . Diabetes mellitus   . Fibromyalgia   . Gastric ulcer   . Gastroparesis   . Gout   . Hyperlipidemia   . Hypertension   . Obesity   . Pneumonia   . Stroke [facility]) 02/2011    Past Surgical History:  Procedure Laterality Date  . CATARACT EXTRACTION  01/2014  . CHOLECYSTECTOMY       reports that she has never smoked. She has never used smokeless tobacco. She reports that she does not drink alcohol or use drugs.  Allergies  Allergen Reactions  . Lisinopril Anaphylaxis    Tongue and mouth swelling  . Penicillins Palpitations    Has patient had a PCN reaction causing immediate rash, facial/tongue/throat swelling, SOB or lightheadedness with hypotension: Yes, heart races Has patient had a PCN reaction causing severe rash involving mucus membranes or skin necrosis: No Has patient had a PCN reaction that required hospitalization: Yes  Has patient had a PCN reaction occurring within the last 10 years: No   . Valium [Diazepam] [name]  . Aspirin Other (See Comments)    Irritates stomach ulcer   . Food Hives and Swelling    Carrots, ketchup   . Nsaids Other (See Comments)    ULCER  .  Tylenol [Acetaminophen] Other (See Comments)    Irritates stomach ulcer   . Morphine And Related Palpitations  . Tramadol Nausea And Vomiting    Family History  Problem Relation Age of Onset  . Diabetes Mother   . Diabetes Father   . Heart disease Father   . Diabetes Sister   . Congestive Heart Failure Sister 41  . Diabetes Brother      Prior to Admission  medications   Medication Sig Start Date End Date Taking? Authorizing Provider  allopurinol (ZYLOPRIM) 100 MG tablet Take 1 tablet (100 mg total) by mouth daily. 09/23/15  Yes Debbe Odea, MD  amLODipine (NORVASC) 10 MG tablet Take 1 tablet (10 mg total) by mouth daily. 06/08/16  Yes Nita Sells, MD  atorvastatin (LIPITOR) 20 MG tablet Take 20 mg by mouth at bedtime.   Yes Historical Provider, MD  colchicine 0.6 MG tablet Take 0.6 mg by mouth daily as needed (gout).  01/31/16  Yes Historical Provider, MD  DULoxetine (CYMBALTA) 30 MG capsule Take 1 capsule (30 mg total) by mouth 2 (two) times daily. 09/23/15  Yes Debbe Odea, MD  metoCLOPramide (REGLAN) 10 MG tablet Take 1 tablet (10 mg total) by mouth 3 (three) times daily before meals. 06/08/16  Yes Nita Sells, MD  metoprolol succinate (TOPROL-XL) 25 MG 24 hr tablet Take 1 tablet (25 mg total) by mouth daily. 06/08/16  Yes Nita Sells, MD  NOVOLOG MIX 70/30 (70-30) 100 UNIT/ML injection 75 units in am, 60 units  lunch, and 50 units pm 06/08/16  Yes Nita Sells, MD  ondansetron (ZOFRAN ODT) 4 MG disintegrating tablet Take 1 tablet (4 mg total) by mouth every 8 (eight) hours as needed for nausea or vomiting. 06/08/16  Yes Nita Sells, MD  pantoprazole (PROTONIX) 40 MG tablet Take 1 tablet (40 mg total) by mouth 2 (two) times daily. 06/08/16  Yes Nita Sells, MD  sucralfate (CARAFATE) 1 g tablet Take 1 tablet (1 g total) by mouth 4 (four) times daily -  with meals and at bedtime. 06/08/16  Yes Nita Sells, MD  albuterol (PROVENTIL HFA;VENTOLIN HFA) 108 (90 Base) MCG/ACT inhaler Inhale 2 puffs into the lungs every 6 (six) hours as needed for wheezing or shortness of breath. 09/23/15   Debbe Odea, MD  nitroGLYCERIN (NITROSTAT) 0.4 MG SL tablet Place 1 tablet (0.4 mg total) under the tongue every 5 (five) minutes as needed for chest pain. 12/20/15   Mauricio Gerome Apley, MD    Physical Exam: Vitals:    06/17/16 1406 06/17/16 1628 06/17/16 2006 06/17/16 2236  BP: 113/81 (!) 119/100 128/71 132/71  Pulse: (!) 122 (!) 122 100 98  Resp: 16 20 18 18   Temp: 97.9 F (36.6 C) 98 F (36.7 C)    TempSrc: Oral Oral    SpO2: 100% 98% 100% 98%      Constitutional: NAD, in apparent discomfort.  Eyes: PERTLA, lids and conjunctivae normal ENMT: Mucous membranes are dry. Posterior pharynx clear of any exudate or lesions.   Neck: normal, supple, no masses, no thyromegaly Respiratory: clear to auscultation bilaterally, no wheezing, no crackles. Normal respiratory effort.   Cardiovascular: Rate ~110 and regular. No extremity edema. No significant JVD. Abdomen: No distension, soft, no masses palpated. Generalized tenderness without rebound pain or guarding. Only rare bowel sound appreciated.  Musculoskeletal: no clubbing / cyanosis. No joint deformity upper and lower extremities.  Skin: no significant rashes, lesions, ulcers. Warm, dry, well-perfused. Neurologic: CN 2-12 grossly intact. Sensation intact. Strength 5/5  in all 4 limbs.  Psychiatric: Alert and oriented x 3. Tearful.     Labs on Admission: I have personally reviewed following labs and imaging studies  CBC:  Recent Labs Lab 06/17/16 1414  WBC 9.4  HGB 11.2*  HCT 33.6*  MCV 85.1  PLT 740   Basic Metabolic Panel:  Recent Labs Lab 06/17/16 1414  NA 133*  K 2.9*  CL 96*  CO2 25  GLUCOSE 374*  BUN 13  CREATININE 1.42*  CALCIUM 8.2*   GFR: Estimated Creatinine Clearance: 64.5 mL/min (A) (by C-G formula based on SCr of 1.42 mg/dL (H)). Liver Function Tests: No results for input(s): AST, ALT, ALKPHOS, BILITOT, PROT, ALBUMIN in the last 168 hours.  Recent Labs Lab 06/17/16 1414  LIPASE 19   No results for input(s): AMMONIA in the last 168 hours. Coagulation Profile: No results for input(s): INR, PROTIME in the last 168 hours. Cardiac Enzymes: No results for input(s): CKTOTAL, CKMB, CKMBINDEX, TROPONINI in the last  168 hours. BNP (last 3 results) No results for input(s): PROBNP in the last 8760 hours. HbA1C: No results for input(s): HGBA1C in the last 72 hours. CBG: No results for input(s): GLUCAP in the last 168 hours. Lipid Profile: No results for input(s): CHOL, HDL, LDLCALC, TRIG, CHOLHDL, LDLDIRECT in the last 72 hours. Thyroid Function Tests: No results for input(s): TSH, T4TOTAL, FREET4, T3FREE, THYROIDAB in the last 72 hours. Anemia Panel: No results for input(s): VITAMINB12, FOLATE, FERRITIN, TIBC, IRON, RETICCTPCT in the last 72 hours. Urine analysis:    Component Value Date/Time   COLORURINE YELLOW 06/05/2016 1135   APPEARANCEUR CLOUDY (A) 06/05/2016 1135   LABSPEC 1.023 06/05/2016 1135   PHURINE 5.0 06/05/2016 1135   GLUCOSEU >=500 (A) 06/05/2016 1135   HGBUR MODERATE (A) 06/05/2016 1135   BILIRUBINUR NEGATIVE 06/05/2016 1135   KETONESUR NEGATIVE 06/05/2016 1135   PROTEINUR >=300 (A) 06/05/2016 1135   UROBILINOGEN 0.2 10/02/2013 2108   NITRITE NEGATIVE 06/05/2016 1135   LEUKOCYTESUR LARGE (A) 06/05/2016 1135   Sepsis Labs: @LABRCNTIP (procalcitonin:4,lacticidven:4) )No results found for this or any previous visit (from the past 240 hour(s)).   Radiological Exams on Admission: Dg Chest 2 View  Result Date: 06/17/2016 CLINICAL DATA:  Chest pain. EXAM: CHEST  2 VIEW COMPARISON:  Radiographs of June 05, 2016. FINDINGS: The heart size and mediastinal contours are within normal limits. Both lungs are clear. No pneumothorax or pleural effusion is noted. The visualized skeletal structures are unremarkable. IMPRESSION: No active cardiopulmonary disease. Electronically Signed   By: Marijo Conception, M.D.   On: 06/17/2016 14:47    EKG: Independently reviewed. Sinus tachycardia (rate 119), PAC's   Assessment/Plan  1. Diabetic gastroparesis, intractable N/V - Pt presents with 3 days of generalized abd pain and N/V, likely secondary to diabetic gastroparesis  - She is found to be  dehydrated with electrolyte disturbances and acute kidney injury  - She was unable to tolerated PO despite treatment with antiemetics in ED  - Continue IVF hydration, scheduled Reglan and prn antiemetics, start liquid diet as tolerated, monitor and replete lytes   2. Acute kidney injury superimposed on CKD stage II  - SCr is 1.42 on admission, up from apparent baseline of ~0.8  - Likely a prerenal azotemia in setting of persistant nausea with vomiting  - Continue IVF hydration, avoid nephrotoxins, repeat chem panel in am   3. Insulin-dependent DM  - A1c was 9.7% in December '17  - Managed at home with Novolog 70/30, 70  units qAM, 60 units with lunch, and 50 units qHS  - Check CBG q4h, start Lantus 80 units qHS with Novolog 8 units q4h and sliding-scale correctional   4. Hypokalemia  - Serum potassium is 2.9 on presentation - Likely secondary to GI-losses  - Given 30 mEq IV potassium in ED and 20 mEq KCl added to IVF  - 2 grams magnesium given empirically   - Monitor on telemetry and repeat chem panel in am   5. Hypertension  - BP at goal  - Continue Norvasc and Toprol as tolerated   6. Hx of stroke  - Stable, no new focal deficits  - Continue Lipitor    DVT prophylaxis: sq Lovenox  Code Status: Full  Family Communication: Discussed with patient Disposition Plan: Observe on telemetry Consults called: None Admission status: Observation     Vianne Bulls, MD [facility] Pager [phone]  If 7PM-7AM, please contact night-coverage [email or web address] Password [facility]  06/17/2016, 10:45 PM

## 2016-06-17 NOTE — ED Notes (Signed)
Patient upset and crying while rechecking vital signs, RN Lanelle Bal talked with patient and explained the wait.

## 2016-06-17 NOTE — Progress Notes (Signed)
Lovenox per Pharmacy for DVT Prophylaxis    Pharmacy has been consulted from dosing enoxaparin (lovenox) in this patient for DVT prophylaxis.  The pharmacist has reviewed pertinent labs (Hgb 11.2___; PLT__391_), patient weight (__131.6_kg) and renal function (CrCl_~65__mL/min) and decided that enoxaparin _60_mg SQ Q24Hrs is appropriate for this patient.  The pharmacy department will sign off at this time.  Please reconsult pharmacy if status changes or for further issues.  Thank you  Cyndia Diver PharmD, BCPS  06/17/2016, 11:06 PM

## 2016-06-18 ENCOUNTER — Observation Stay (HOSPITAL_COMMUNITY): Payer: Self-pay

## 2016-06-18 DIAGNOSIS — R739 Hyperglycemia, unspecified: Secondary | ICD-10-CM

## 2016-06-18 DIAGNOSIS — N182 Chronic kidney disease, stage 2 (mild): Secondary | ICD-10-CM

## 2016-06-18 DIAGNOSIS — N179 Acute kidney failure, unspecified: Secondary | ICD-10-CM

## 2016-06-18 DIAGNOSIS — I10 Essential (primary) hypertension: Secondary | ICD-10-CM

## 2016-06-18 DIAGNOSIS — K3184 Gastroparesis: Secondary | ICD-10-CM

## 2016-06-18 LAB — BASIC METABOLIC PANEL
Anion gap: 10 (ref 5–15)
BUN: 15 mg/dL (ref 6–20)
CHLORIDE: 103 mmol/L (ref 101–111)
CO2: 24 mmol/L (ref 22–32)
Calcium: 7.8 mg/dL — ABNORMAL LOW (ref 8.9–10.3)
Creatinine, Ser: 1.52 mg/dL — ABNORMAL HIGH (ref 0.44–1.00)
GFR calc Af Amer: 44 mL/min — ABNORMAL LOW (ref >60)
GFR calc non Af Amer: 38 mL/min — ABNORMAL LOW (ref >60)
Glucose, Bld: 151 mg/dL — ABNORMAL HIGH (ref 65–99)
POTASSIUM: 2.7 mmol/L — AB (ref 3.5–5.1)
SODIUM: 137 mmol/L (ref 135–145)

## 2016-06-18 LAB — URINALYSIS, ROUTINE W REFLEX MICROSCOPIC
BILIRUBIN URINE: NEGATIVE
Ketones, ur: NEGATIVE mg/dL
NITRITE: NEGATIVE
Protein, ur: 100 mg/dL — AB
SPECIFIC GRAVITY, URINE: 1.014 (ref 1.005–1.030)
pH: 5 (ref 5.0–8.0)

## 2016-06-18 LAB — GLUCOSE, CAPILLARY
GLUCOSE-CAPILLARY: 230 mg/dL — AB (ref 65–99)
GLUCOSE-CAPILLARY: 175 mg/dL — AB (ref 65–99)
GLUCOSE-CAPILLARY: 134 mg/dL — AB (ref 65–99)
Glucose-Capillary: 245 mg/dL — ABNORMAL HIGH (ref 65–99)
Glucose-Capillary: 157 mg/dL — ABNORMAL HIGH (ref 65–99)
Glucose-Capillary: 130 mg/dL — ABNORMAL HIGH (ref 65–99)

## 2016-06-18 LAB — MAGNESIUM: Magnesium: 1.7 mg/dL (ref 1.7–2.4)

## 2016-06-18 MED ORDER — POTASSIUM CHLORIDE CRYS ER 20 MEQ PO TBCR
40.0000 meq | EXTENDED_RELEASE_TABLET | Freq: Two times a day (BID) | ORAL | Status: AC
  Administered 2016-06-18 (×2): 40 meq via ORAL
  Filled 2016-06-18 (×2): qty 2

## 2016-06-18 MED ORDER — FENTANYL CITRATE (PF) 100 MCG/2ML IJ SOLN
12.5000 ug | Freq: Once | INTRAMUSCULAR | Status: AC
  Administered 2016-06-18: 12.5 ug via INTRAVENOUS
  Filled 2016-06-18: qty 2

## 2016-06-18 MED ORDER — METOCLOPRAMIDE HCL 5 MG/ML IJ SOLN
10.0000 mg | Freq: Four times a day (QID) | INTRAMUSCULAR | Status: DC
Start: 2016-06-18 — End: 2016-06-19
  Administered 2016-06-18 – 2016-06-19 (×5): 10 mg via INTRAVENOUS
  Filled 2016-06-18 (×5): qty 2

## 2016-06-18 MED ORDER — MAGNESIUM SULFATE 2 GM/50ML IV SOLN
2.0000 g | Freq: Once | INTRAVENOUS | Status: AC
Start: 2016-06-18 — End: 2016-06-18
  Administered 2016-06-18: 2 g via INTRAVENOUS

## 2016-06-18 MED ORDER — POTASSIUM CHLORIDE IN NACL 20-0.9 MEQ/L-% IV SOLN
INTRAVENOUS | Status: AC
Start: 2016-06-18 — End: 2016-06-19
  Administered 2016-06-18 (×2): via INTRAVENOUS
  Filled 2016-06-18 (×2): qty 1000

## 2016-06-18 NOTE — Progress Notes (Signed)
PROGRESS NOTE    Deborah [name]  GNF:[id number] DOB: [birth year] DOA: 06/17/2016 PCP: Nolon Bussing, PA    Brief Narrative:  51 y.o. female with medical history significant for insulin-dependent diabetes mellitus, diabetic gastroparesis, hypertension, GERD, and history of stroke who presents to the emergency department with 3 days of lower abdominal pain and nausea with nonbloody nonbilious vomiting. Patient reports that she had been in her usual state of health until approximately 3 days ago when she noted the insidious development of pain in the lower abdomen with nausea. Since that time, the pain has worsened significantly and she has experienced persistent nausea and vomiting. Pain is described as severe, constant, primarily in the lower quadrants, sharp in character, and worse with oral intake. Patient denies any fevers or chills. She denies chest pain per se, but notes indigestion with belching and a burning sensation in the central chest. She describes her symptoms as very similar to her prior experiences with gastroparesis. She has not attempted any interventions prior to coming in. She denies any diarrhea, hematochezia, or melena.  ED Course: Upon arrival to the ED, patient is found to be afebrile, saturating well on room air, tachycardic in the 120s, and with stable blood pressure. EKG features a sinus tachycardia with rate 119 and PACs. Chest x-ray is negative for acute cardiopulmonary disease. Chemistry panel reveals a sodium of 133, potassium 2.9, and serum creatinine 1.42, up from an apparent baseline of 0.8. Hemoglobin is stable at 11.2 and troponin is undetectable. Lipase is within normal limits. Patient was given 2 L normal saline and treated with Zofran, Phenergan, and Haldol in the emergency department. She was also given Pepcid, fentanyl, and 30 mEq IV potassium. Despite these measures in the ED, the patient has continued to be intolerant of food or drink and will be observed  on the telemetry unit for ongoing evaluation and management of abdominal pain with intractable nausea and vomiting and subsequent dehydration and AKI, suspected secondary to diabetic gastroparesis.  Assessment & Plan:   Principal Problem:   Intractable nausea and vomiting Active Problems:   Essential hypertension, benign   Uncontrolled type 2 diabetes mellitus with diabetic neuropathy, with long-term current use of insulin (HCC)   CKD (chronic kidney disease), stage II   Gastroparesis   Hypokalemia   AKI (acute kidney injury) ([place])  1. Diabetic gastroparesis, intractable N/V - Pt presents with 3 days of generalized abd pain and N/V, likely secondary to diabetic gastroparesis  - Pt noted to be dehydrated with electrolyte disturbances and acute kidney injury  - On admit, patient was unable to tolerated PO despite treatment with antiemetics in ED  - Continue IVF hydration, scheduled Reglan and prn antiemetics, start liquid diet as tolerated, monitor and replete lytes   2. Acute kidney injury superimposed on CKD stage II  - SCr noted to be 1.42 on admission, baseline Cr of ~0.8  - Likely a prerenal azotemia in setting of persistant nausea with vomiting  - Continue IVF hydration, avoid nephrotoxins - Will check renal panel in am   3. Insulin-dependent DM  - A1c was 9.7% in December '17  - Managed at home with Novolog 70/30, 70 units qAM, 60 units with lunch, and 50 units qHS  - Check CBG q4h, start Lantus 80 units qHS with Novolog 8 units q4h and sliding-scale correctional  - Will continue  4. Hypokalemia  - Serum potassium is 2.9 on presentation - Likely secondary to GI-losses  - noted to be lower at  2.7 - Will continue to replace - Given additional 2gm magnesium  5. Hypertension  - BP currently at goal  - will continue Norvasc and Toprol as tolerated   6. Hx of stroke  - Remains stable without new focal deficits  - Continue Lipitor   DVT prophylaxis: Lovenox  subQ Code Status: Full Family Communication: Pt in room, family not at bedside Disposition Plan: Uncertain at this time  Consultants:     Procedures:     Antimicrobials: Anti-infectives    None      Subjective: Still feeling nauseated  Objective: Vitals:   06/17/16 2006 06/17/16 2236 06/17/16 2255 06/18/16 0458  BP: 128/71 132/71 (!) 134/94 117/61  Pulse: 100 98 97 97  Resp: 18 18 18 18   Temp:   98.1 F (36.7 C) 98.6 F (37 C)  TempSrc:   Oral Oral  SpO2: 100% 98% 98% 100%  Weight:   131.6 kg (290 lb 2 oz)   Height:   5\' 6"  (1.676 m)     Intake/Output Summary (Last 24 hours) at 06/18/16 1401 Last data filed at 06/18/16 1100  Gross per 24 hour  Intake          2448.33 ml  Output                0 ml  Net          2448.33 ml   Filed Weights   06/17/16 2255  Weight: 131.6 kg (290 lb 2 oz)    Examination:  General exam: Appears calm and comfortable  Respiratory system: Clear to auscultation. Respiratory effort normal. Cardiovascular system: S1 & S2 heard, RRR. No JVD, murmurs, rubs, gallops or clicks. No pedal edema. Gastrointestinal system: Abdomen soft, decreased BS, generally tender on deeper palpation Central nervous system: Alert and oriented. No focal neurological deficits. Extremities: Symmetric 5 x 5 power. Skin: No rashes, lesions  Psychiatry: Judgement and insight appear normal. Mood & affect appropriate.   Data Reviewed: I have personally reviewed following labs and imaging studies  CBC:  Recent Labs Lab 06/17/16 1414  WBC 9.4  HGB 11.2*  HCT 33.6*  MCV 85.1  PLT 433   Basic Metabolic Panel:  Recent Labs Lab 06/17/16 1414 06/18/16 0509  NA 133* 137  K 2.9* 2.7*  CL 96* 103  CO2 25 24  GLUCOSE 374* 151*  BUN 13 15  CREATININE 1.42* 1.52*  CALCIUM 8.2* 7.8*  MG  --  1.7   GFR: Estimated Creatinine Clearance: 60.3 mL/min (A) (by C-G formula based on SCr of 1.52 mg/dL (H)). Liver Function Tests: No results for input(s):  AST, ALT, ALKPHOS, BILITOT, PROT, ALBUMIN in the last 168 hours.  Recent Labs Lab 06/17/16 1414  LIPASE 19   No results for input(s): AMMONIA in the last 168 hours. Coagulation Profile: No results for input(s): INR, PROTIME in the last 168 hours. Cardiac Enzymes: No results for input(s): CKTOTAL, CKMB, CKMBINDEX, TROPONINI in the last 168 hours. BNP (last 3 results) No results for input(s): PROBNP in the last 8760 hours. HbA1C: No results for input(s): HGBA1C in the last 72 hours. CBG:  Recent Labs Lab 06/17/16 2336 06/18/16 0140 06/18/16 0450 06/18/16 0754 06/18/16 1229  GLUCAP 298* 245* 157* 130* 230*   Lipid Profile: No results for input(s): CHOL, HDL, LDLCALC, TRIG, CHOLHDL, LDLDIRECT in the last 72 hours. Thyroid Function Tests: No results for input(s): TSH, T4TOTAL, FREET4, T3FREE, THYROIDAB in the last 72 hours. Anemia Panel: No results for input(s): VITAMINB12,  FOLATE, FERRITIN, TIBC, IRON, RETICCTPCT in the last 72 hours. Sepsis Labs: No results for input(s): PROCALCITON, LATICACIDVEN in the last 168 hours.  No results found for this or any previous visit (from the past 240 hour(s)).   Radiology Studies: Dg Chest 2 View  Result Date: 06/17/2016 CLINICAL DATA:  Chest pain. EXAM: CHEST  2 VIEW COMPARISON:  Radiographs of June 05, 2016. FINDINGS: The heart size and mediastinal contours are within normal limits. Both lungs are clear. No pneumothorax or pleural effusion is noted. The visualized skeletal structures are unremarkable. IMPRESSION: No active cardiopulmonary disease. Electronically Signed   By: Marijo Conception, M.D.   On: 06/17/2016 14:47   Dg Abd Portable 1v  Result Date: 06/18/2016 CLINICAL DATA:  Nausea, vomiting EXAM: PORTABLE ABDOMEN - 1 VIEW COMPARISON:  04/23/2016 FINDINGS: Prior cholecystectomy. There is a non obstructive bowel gas pattern. No supine evidence of free air. No organomegaly or suspicious calcification. No acute bony abnormality.  IMPRESSION: No acute findings. Electronically Signed   By: Rolm Baptise M.D.   On: 06/18/2016 10:34    Scheduled Meds: . allopurinol  100 mg Oral Daily  . amLODipine  10 mg Oral Daily  . atorvastatin  20 mg Oral QHS  . DULoxetine  30 mg Oral BID  . enoxaparin (LOVENOX) injection  60 mg Subcutaneous QHS  . insulin aspart  0-20 Units Subcutaneous Q4H  . insulin glargine  80 Units Subcutaneous QHS  . metoCLOPramide (REGLAN) injection  10 mg Intravenous Q6H  . metoprolol succinate  25 mg Oral q1800  . pantoprazole  40 mg Oral BID  . potassium chloride  40 mEq Oral BID  . sodium chloride flush  3 mL Intravenous Q12H  . sucralfate  1 g Oral TID WC & HS   Continuous Infusions:   LOS: 0 days   Meliyah Simon, Orpah Melter, MD [facility] Pager [phone]  If 7PM-7AM, please contact night-coverage [email or web address] Password [email or web address] 06/18/2016, 2:01 PM

## 2016-06-18 NOTE — Progress Notes (Signed)
CRITICAL VALUE ALERT  Critical value received:  K+ 2.7  Date of notification:  06/17/16  Time of notification:  0700  Critical value read back:Yes.    Nurse who received alert:  Leona Carry  MD notified (1st page):  Wyline Copas  Time of first page:  0725

## 2016-06-19 DIAGNOSIS — E860 Dehydration: Secondary | ICD-10-CM

## 2016-06-19 DIAGNOSIS — L02415 Cutaneous abscess of right lower limb: Secondary | ICD-10-CM

## 2016-06-19 LAB — BASIC METABOLIC PANEL
Anion gap: 7 (ref 5–15)
BUN: 8 mg/dL (ref 6–20)
CALCIUM: 7.8 mg/dL — AB (ref 8.9–10.3)
CHLORIDE: 107 mmol/L (ref 101–111)
CO2: 24 mmol/L (ref 22–32)
CREATININE: 1 mg/dL (ref 0.44–1.00)
Glucose, Bld: 177 mg/dL — ABNORMAL HIGH (ref 65–99)
Potassium: 3.4 mmol/L — ABNORMAL LOW (ref 3.5–5.1)
SODIUM: 138 mmol/L (ref 135–145)

## 2016-06-19 LAB — MAGNESIUM: MAGNESIUM: 1.7 mg/dL (ref 1.7–2.4)

## 2016-06-19 LAB — GLUCOSE, CAPILLARY
Glucose-Capillary: 157 mg/dL — ABNORMAL HIGH (ref 65–99)
Glucose-Capillary: 160 mg/dL — ABNORMAL HIGH (ref 65–99)
Glucose-Capillary: 179 mg/dL — ABNORMAL HIGH (ref 65–99)
Glucose-Capillary: 201 mg/dL — ABNORMAL HIGH (ref 65–99)

## 2016-06-19 MED ORDER — TRIPLE ANTIBIOTIC 5-400-5000 EX OINT: TOPICAL_OINTMENT | Freq: Three times a day (TID) | CUTANEOUS | 0 refills | Status: DC

## 2016-06-19 MED ORDER — POTASSIUM CHLORIDE CRYS ER 20 MEQ PO TBCR
40.0000 meq | EXTENDED_RELEASE_TABLET | Freq: Once | ORAL | Status: AC
  Administered 2016-06-19: 40 meq via ORAL
  Filled 2016-06-19: qty 2

## 2016-06-19 MED ORDER — BISACODYL 5 MG PO TBEC: 5.0000 mg | DELAYED_RELEASE_TABLET | Freq: Every day | ORAL | 0 refills | Status: DC | PRN

## 2016-06-19 NOTE — Discharge Summary (Addendum)
Physician Discharge Summary  Deborah [name] TKP:[id number] DOB: 1966/01/28 DOA: 06/17/2016  PCP: Nolon Bussing, PA  Admit date: 06/17/2016 Discharge date: 06/19/2016  Admitted From: Home Disposition:  Home  Recommendations for Outpatient Follow-up:  1. Follow up with PCP in 1-2 weeks 2. Consider referral to GI as outpatient for recurrent gastroparesis  Discharge Condition:Improved CODE STATUS:Full Diet recommendation: Diabetic   Brief/Interim Summary: 51 y.o.femalewith medical history significant for insulin-dependent diabetes mellitus, diabetic gastroparesis, hypertension, GERD, and history of stroke who presents to the emergency department with 3 days of lower abdominal pain and nausea with nonbloody nonbilious vomiting. Patient reports that she had been in her usual state of health until approximately 3 days ago when she noted the insidious development of pain in the lower abdomen with nausea. Since that time, the pain has worsened significantly and she has experienced persistent nausea and vomiting. Pain is described as severe, constant, primarily in the lower quadrants, sharp in character, and worse with oral intake. Patient denies any fevers or chills. She denies chest pain per se, but notes indigestion with belching and a burning sensation in the central chest. She describes her symptoms as very similar to her prior experiences with gastroparesis. She has not attempted any interventions prior to coming in. She denies any diarrhea, hematochezia, or melena.  ED Course:Upon arrival to the ED, patient is found to be afebrile, saturating well on room air, tachycardic in the 120s, and with stable blood pressure. EKG features a sinus tachycardia with rate 119 and PACs. Chest x-ray is negative for acute cardiopulmonary disease. Chemistry panel reveals a sodium of 133, potassium 2.9, and serum creatinine 1.42, up from an apparent baseline of 0.8. Hemoglobin is stable at 11.2 and  troponin is undetectable. Lipase is within normal limits. Patient was given 2 L normal saline and treated with Zofran, Phenergan, and Haldol in the emergency department. She was also given Pepcid, fentanyl, and 30 mEq IV potassium. Despite these measures in the ED, the patient has continued to be intolerant of food or drink and will be observed on the telemetry unit for ongoing evaluation and management of abdominal pain with intractable nausea and vomiting and subsequent dehydration and AKI, suspected secondary to diabetic gastroparesis.  1. Diabetic gastroparesis, intractable N/V - Pt presents with 3 days of generalized abd pain and N/V, likely secondary to diabetic gastroparesis  - Pt noted to be dehydrated with electrolyte disturbances and acute kidney injury  - On admit, patient was unable to tolerated PO despite treatment with antiemetics in ED  - Patient was continued on IVF hydration, scheduled Reglan and prn antiemetics - Symptoms resolved by the following day with bowel movements noted. Patient was able to tolerate diabetic diet without difficulty  2. Acute kidney injury superimposed on CKD stage II  - SCr noted to be 1.42 on admission, baseline Cr of ~0.8  - Likely a prerenal azotemia in setting of persistant nausea with vomiting  - Cr improved to 1.0  3. Insulin-dependent DM  - A1c was 9.7% in December '17  - Managed at home with Novolog 70/30, 70 units qAM, 60 units with lunch, and 50 units qHS  - Will continue diabetic regimen on discharge  4. Hypokalemia  - Serum potassium is 2.9 on presentation - Likely secondary to GI-losses  - Corrected  5. Hypertension  - BP currently at goal  - will continue Norvasc and Toprol as tolerated  6. Hx of stroke  - Remains stable without new focal deficits  -  Continue Lipitor   7. R thigh abscess - s/p I/d in ED - on eval, wound appears healing without surrounding erythema or active drainage - Recommend topical antibiotic  cream - Patient advised to assess for signs of developing cellulitis  Discharge Diagnoses:  Principal Problem:   Intractable nausea and vomiting Active Problems:   Essential hypertension, benign   Uncontrolled type 2 diabetes mellitus with diabetic neuropathy, with long-term current use of insulin (HCC)   CKD (chronic kidney disease), stage II   Gastroparesis   Hypokalemia   AKI (acute kidney injury) ([place])    Discharge Instructions   Allergies as of 06/19/2016      Reactions   Lisinopril Anaphylaxis   Tongue and mouth swelling   Penicillins Palpitations   Has patient had a PCN reaction causing immediate rash, facial/tongue/throat swelling, SOB or lightheadedness with hypotension: Yes, heart races Has patient had a PCN reaction causing severe rash involving mucus membranes or skin necrosis: No Has patient had a PCN reaction that required hospitalization: Yes  Has patient had a PCN reaction occurring within the last 10 years: No   Valium [diazepam] [name]   Aspirin Other (See Comments)   Irritates stomach ulcer    Food Hives, Swelling   Carrots, ketchup    Nsaids Other (See Comments)   ULCER   Tylenol [acetaminophen] Other (See Comments)   Irritates stomach ulcer    Morphine And Related Palpitations   Tramadol Nausea And Vomiting      Medication List    TAKE these medications   albuterol 108 (90 Base) MCG/ACT inhaler Commonly known as:  PROVENTIL HFA;VENTOLIN HFA Inhale 2 puffs into the lungs every 6 (six) hours as needed for wheezing or shortness of breath.   allopurinol 100 MG tablet Commonly known as:  ZYLOPRIM Take 1 tablet (100 mg total) by mouth daily.   amLODipine 10 MG tablet Commonly known as:  NORVASC Take 1 tablet (10 mg total) by mouth daily.   atorvastatin 20 MG tablet Commonly known as:  LIPITOR Take 20 mg by mouth at bedtime.   bisacodyl 5 MG EC tablet Commonly known as:  DULCOLAX Take 1 tablet (5 mg total) by mouth daily as  needed for moderate constipation.   colchicine 0.6 MG tablet Take 0.6 mg by mouth daily as needed (gout).   DULoxetine 30 MG capsule Commonly known as:  CYMBALTA Take 1 capsule (30 mg total) by mouth 2 (two) times daily.   metoCLOPramide 10 MG tablet Commonly known as:  REGLAN Take 1 tablet (10 mg total) by mouth 3 (three) times daily before meals.   metoprolol succinate 25 MG 24 hr tablet Commonly known as:  TOPROL-XL Take 1 tablet (25 mg total) by mouth daily.   neomycin-bacitracin-polymyxin [phone] ointment Apply topically 3 (three) times daily. To affected areas   nitroGLYCERIN 0.4 MG SL tablet Commonly known as:  NITROSTAT Place 1 tablet (0.4 mg total) under the tongue every 5 (five) minutes as needed for chest pain.   NOVOLOG MIX 70/30 (70-30) 100 UNIT/ML injection Generic drug:  insulin aspart protamine- aspart 75 units in am, 60 units  lunch, and 50 units pm   ondansetron 4 MG disintegrating tablet Commonly known as:  ZOFRAN ODT Take 1 tablet (4 mg total) by mouth every 8 (eight) hours as needed for nausea or vomiting.   pantoprazole 40 MG tablet Commonly known as:  PROTONIX Take 1 tablet (40 mg total) by mouth 2 (two) times daily.   sucralfate 1  g tablet Commonly known as:  CARAFATE Take 1 tablet (1 g total) by mouth 4 (four) times daily -  with meals and at bedtime.      Follow-up Information    Nolon Bussing, [place]. Schedule an appointment as soon as possible for a visit in 1 week(s).   Specialty:  General Practice Contact information: [place] [id number] [phone]          Allergies  Allergen Reactions  . Lisinopril Anaphylaxis    Tongue and mouth swelling  . Penicillins Palpitations    Has patient had a PCN reaction causing immediate rash, facial/tongue/throat swelling, SOB or lightheadedness with hypotension: Yes, heart races Has patient had a PCN reaction causing severe rash involving mucus membranes or skin  necrosis: No Has patient had a PCN reaction that required hospitalization: Yes  Has patient had a PCN reaction occurring within the last 10 years: No   . Valium [Diazepam] [name]  . Aspirin Other (See Comments)    Irritates stomach ulcer   . Food Hives and Swelling    Carrots, ketchup   . Nsaids Other (See Comments)    ULCER  . Tylenol [Acetaminophen] Other (See Comments)    Irritates stomach ulcer   . Morphine And Related Palpitations  . Tramadol Nausea And Vomiting     Procedures/Studies: Dg Chest 2 View  Result Date: 06/17/2016 CLINICAL DATA:  Chest pain. EXAM: CHEST  2 VIEW COMPARISON:  Radiographs of June 05, 2016. FINDINGS: The heart size and mediastinal contours are within normal limits. Both lungs are clear. No pneumothorax or pleural effusion is noted. The visualized skeletal structures are unremarkable. IMPRESSION: No active cardiopulmonary disease. Electronically Signed   By: Marijo Conception, M.D.   On: 06/17/2016 14:47   Dg Chest 2 View  Result Date: 06/05/2016 CLINICAL DATA:  She c/o SOB and chest pain and upper abd pain since yesterday evening. Pt has no other chest complaints. PT Hx: diabetic, HTN, pneumonia EXAM: CHEST - 2 VIEW COMPARISON:  04/25/2016 FINDINGS: Lungs are clear. Heart size and mediastinal contours are within normal limits. No effusion. Visualized bones unremarkable. IMPRESSION: No acute cardiopulmonary disease. Electronically Signed   By: Lucrezia Europe M.D.   On: 06/05/2016 10:23   Dg Abd Portable 1v  Result Date: 06/18/2016 CLINICAL DATA:  Nausea, vomiting EXAM: PORTABLE ABDOMEN - 1 VIEW COMPARISON:  04/23/2016 FINDINGS: Prior cholecystectomy. There is a non obstructive bowel gas pattern. No supine evidence of free air. No organomegaly or suspicious calcification. No acute bony abnormality. IMPRESSION: No acute findings. Electronically Signed   By: Rolm Baptise M.D.   On: 06/18/2016 10:34     Subjective: Very eager to go home  Discharge  Exam: Vitals:   06/18/16 2028 06/19/16 0543  BP: (!) 104/56 104/60  Pulse: 93 94  Resp: 18 18  Temp: 98.4 F (36.9 C) 97.9 F (36.6 C)   Vitals:   06/18/16 0458 06/18/16 1447 06/18/16 2028 06/19/16 0543  BP: 117/61 (!) 117/52 (!) 104/56 104/60  Pulse: 97 95 93 94  Resp: 18 18 18 18   Temp: 98.6 F (37 C) 98.5 F (36.9 C) 98.4 F (36.9 C) 97.9 F (36.6 C)  TempSrc: Oral Oral Oral Oral  SpO2: 100% 100% 95% 94%  Weight:      Height:        General: Pt is alert, awake, not in acute distress Cardiovascular: RRR, S1/S2 +, no rubs, no gallops Respiratory: CTA bilaterally, no wheezing,  no rhonchi Abdominal: Soft, NT, ND, bowel sounds + Extremities: no edema, no cyanosis   The results of significant diagnostics from this hospitalization (including imaging, microbiology, ancillary and laboratory) are listed below for reference.     Microbiology: No results found for this or any previous visit (from the past 240 hour(s)).   Labs: BNP (last 3 results) No results for input(s): BNP in the last 8760 hours. Basic Metabolic Panel:  Recent Labs Lab 06/17/16 1414 06/18/16 0509 06/19/16 0513  NA 133* 137 138  K 2.9* 2.7* 3.4*  CL 96* 103 107  CO2 25 24 24   GLUCOSE 374* 151* 177*  BUN 13 15 8   CREATININE 1.42* 1.52* 1.00  CALCIUM 8.2* 7.8* 7.8*  MG  --  1.7 1.7   Liver Function Tests: No results for input(s): AST, ALT, ALKPHOS, BILITOT, PROT, ALBUMIN in the last 168 hours.  Recent Labs Lab 06/17/16 1414  LIPASE 19   No results for input(s): AMMONIA in the last 168 hours. CBC:  Recent Labs Lab 06/17/16 1414  WBC 9.4  HGB 11.2*  HCT 33.6*  MCV 85.1  PLT 391   Cardiac Enzymes: No results for input(s): CKTOTAL, CKMB, CKMBINDEX, TROPONINI in the last 168 hours. BNP: Invalid input(s): POCBNP CBG:  Recent Labs Lab 06/18/16 2028 06/19/16 0036 06/19/16 0437 06/19/16 0937 06/19/16 1209  GLUCAP 134* 157* 179* 160* 201*   D-Dimer No results for input(s):  DDIMER in the last 72 hours. Hgb A1c No results for input(s): HGBA1C in the last 72 hours. Lipid Profile No results for input(s): CHOL, HDL, LDLCALC, TRIG, CHOLHDL, LDLDIRECT in the last 72 hours. Thyroid function studies No results for input(s): TSH, T4TOTAL, T3FREE, THYROIDAB in the last 72 hours.  Invalid input(s): FREET3 Anemia work up No results for input(s): VITAMINB12, FOLATE, FERRITIN, TIBC, IRON, RETICCTPCT in the last 72 hours. Urinalysis    Component Value Date/Time   COLORURINE AMBER (A) 06/18/2016 0040   APPEARANCEUR CLOUDY (A) 06/18/2016 0040   LABSPEC 1.014 06/18/2016 0040   PHURINE 5.0 06/18/2016 0040   GLUCOSEU >=500 (A) 06/18/2016 0040   HGBUR SMALL (A) 06/18/2016 0040   BILIRUBINUR NEGATIVE 06/18/2016 0040   KETONESUR NEGATIVE 06/18/2016 0040   PROTEINUR 100 (A) 06/18/2016 0040   UROBILINOGEN 0.2 10/02/2013 2108   NITRITE NEGATIVE 06/18/2016 0040   LEUKOCYTESUR SMALL (A) 06/18/2016 0040   Sepsis Labs Invalid input(s): PROCALCITONIN,  WBC,  LACTICIDVEN Microbiology No results found for this or any previous visit (from the past 240 hour(s)).   SIGNED:   Donne Hazel, MD  [facility] 06/19/2016, 2:44 PM  If 7PM-7AM, please contact night-coverage [email or web address] Password [email or web address]

## 2016-06-19 NOTE — Progress Notes (Signed)
Assisting current RN, pt states she is ready for discharge, tolerating lunch without issues, denies nausea, vomiting, abd pain. MD paged. SRP, RN

## 2016-07-04 ENCOUNTER — Encounter (HOSPITAL_COMMUNITY): Payer: Self-pay | Admitting: Emergency Medicine

## 2016-07-04 ENCOUNTER — Inpatient Hospital Stay (HOSPITAL_COMMUNITY)
Admission: EM | Admit: 2016-07-04 | Discharge: 2016-07-06 | DRG: 074 | Discharge status: Against Medical Advice | Payer: Self-pay | Attending: Internal Medicine | Admitting: Internal Medicine

## 2016-07-04 ENCOUNTER — Emergency Department (HOSPITAL_COMMUNITY): Payer: Self-pay

## 2016-07-04 ENCOUNTER — Observation Stay (HOSPITAL_COMMUNITY): Payer: Self-pay

## 2016-07-04 DIAGNOSIS — M109 Gout, unspecified: Secondary | ICD-10-CM | POA: Diagnosis present

## 2016-07-04 DIAGNOSIS — Z886 Allergy status to analgesic agent status: Secondary | ICD-10-CM

## 2016-07-04 DIAGNOSIS — E1122 Type 2 diabetes mellitus with diabetic chronic kidney disease: Secondary | ICD-10-CM | POA: Diagnosis present

## 2016-07-04 DIAGNOSIS — E669 Obesity, unspecified: Secondary | ICD-10-CM | POA: Diagnosis present

## 2016-07-04 DIAGNOSIS — Z8673 Personal history of transient ischemic attack (TIA), and cerebral infarction without residual deficits: Secondary | ICD-10-CM

## 2016-07-04 DIAGNOSIS — K7460 Unspecified cirrhosis of liver: Secondary | ICD-10-CM | POA: Diagnosis present

## 2016-07-04 DIAGNOSIS — IMO0002 Reserved for concepts with insufficient information to code with codable children: Secondary | ICD-10-CM

## 2016-07-04 DIAGNOSIS — M1A9XX0 Chronic gout, unspecified, without tophus (tophi): Secondary | ICD-10-CM | POA: Diagnosis present

## 2016-07-04 DIAGNOSIS — K219 Gastro-esophageal reflux disease without esophagitis: Secondary | ICD-10-CM | POA: Diagnosis present

## 2016-07-04 DIAGNOSIS — E872 Acidosis: Secondary | ICD-10-CM | POA: Diagnosis present

## 2016-07-04 DIAGNOSIS — N182 Chronic kidney disease, stage 2 (mild): Secondary | ICD-10-CM | POA: Diagnosis present

## 2016-07-04 DIAGNOSIS — E1140 Type 2 diabetes mellitus with diabetic neuropathy, unspecified: Secondary | ICD-10-CM | POA: Diagnosis present

## 2016-07-04 DIAGNOSIS — I129 Hypertensive chronic kidney disease with stage 1 through stage 4 chronic kidney disease, or unspecified chronic kidney disease: Secondary | ICD-10-CM | POA: Diagnosis present

## 2016-07-04 DIAGNOSIS — I10 Essential (primary) hypertension: Secondary | ICD-10-CM | POA: Diagnosis present

## 2016-07-04 DIAGNOSIS — E1143 Type 2 diabetes mellitus with diabetic autonomic (poly)neuropathy: Principal | ICD-10-CM | POA: Diagnosis present

## 2016-07-04 DIAGNOSIS — Z79899 Other long term (current) drug therapy: Secondary | ICD-10-CM

## 2016-07-04 DIAGNOSIS — Z880 Allergy status to penicillin: Secondary | ICD-10-CM

## 2016-07-04 DIAGNOSIS — Z885 Allergy status to narcotic agent status: Secondary | ICD-10-CM

## 2016-07-04 DIAGNOSIS — K760 Fatty (change of) liver, not elsewhere classified: Secondary | ICD-10-CM | POA: Diagnosis present

## 2016-07-04 DIAGNOSIS — M797 Fibromyalgia: Secondary | ICD-10-CM | POA: Diagnosis present

## 2016-07-04 DIAGNOSIS — D649 Anemia, unspecified: Secondary | ICD-10-CM | POA: Diagnosis present

## 2016-07-04 DIAGNOSIS — Z794 Long term (current) use of insulin: Secondary | ICD-10-CM

## 2016-07-04 DIAGNOSIS — N184 Chronic kidney disease, stage 4 (severe): Secondary | ICD-10-CM | POA: Diagnosis present

## 2016-07-04 DIAGNOSIS — N1832 Chronic kidney disease, stage 3b: Secondary | ICD-10-CM | POA: Diagnosis present

## 2016-07-04 DIAGNOSIS — Z888 Allergy status to other drugs, medicaments and biological substances status: Secondary | ICD-10-CM

## 2016-07-04 DIAGNOSIS — K3184 Gastroparesis: Secondary | ICD-10-CM | POA: Diagnosis present

## 2016-07-04 DIAGNOSIS — R112 Nausea with vomiting, unspecified: Secondary | ICD-10-CM

## 2016-07-04 DIAGNOSIS — E1165 Type 2 diabetes mellitus with hyperglycemia: Secondary | ICD-10-CM | POA: Diagnosis present

## 2016-07-04 DIAGNOSIS — R109 Unspecified abdominal pain: Secondary | ICD-10-CM

## 2016-07-04 LAB — LIPASE, BLOOD: Lipase: 23 U/L (ref 11–51)

## 2016-07-04 LAB — CBC
HEMATOCRIT: 33.6 % — AB (ref 36.0–46.0)
Hemoglobin: 10.8 g/dL — ABNORMAL LOW (ref 12.0–15.0)
MCH: 27.8 pg (ref 26.0–34.0)
MCHC: 32.1 g/dL (ref 30.0–36.0)
MCV: 86.4 fL (ref 78.0–100.0)
PLATELETS: 400 10*3/uL (ref 150–400)
RBC: 3.89 MIL/uL (ref 3.87–5.11)
RDW: 14.2 % (ref 11.5–15.5)
WBC: 11 10*3/uL — ABNORMAL HIGH (ref 4.0–10.5)

## 2016-07-04 LAB — COMPREHENSIVE METABOLIC PANEL
ALBUMIN: 3.6 g/dL (ref 3.5–5.0)
ALK PHOS: 96 U/L (ref 38–126)
ALT: 22 U/L (ref 14–54)
ANION GAP: 13 (ref 5–15)
AST: 27 U/L (ref 15–41)
BUN: 10 mg/dL (ref 6–20)
CALCIUM: 8.8 mg/dL — AB (ref 8.9–10.3)
CO2: 18 mmol/L — AB (ref 22–32)
Chloride: 103 mmol/L (ref 101–111)
Creatinine, Ser: 1.15 mg/dL — ABNORMAL HIGH (ref 0.44–1.00)
GFR calc non Af Amer: 54 mL/min — ABNORMAL LOW (ref >60)
GLUCOSE: 289 mg/dL — AB (ref 65–99)
POTASSIUM: 3.5 mmol/L (ref 3.5–5.1)
Sodium: 134 mmol/L — ABNORMAL LOW (ref 135–145)
Total Bilirubin: 0.7 mg/dL (ref 0.3–1.2)
Total Protein: 7.6 g/dL (ref 6.5–8.1)

## 2016-07-04 LAB — I-STAT TROPONIN, ED: Troponin i, poc: 0 ng/mL (ref 0.00–0.08)

## 2016-07-04 LAB — GLUCOSE, CAPILLARY: GLUCOSE-CAPILLARY: 328 mg/dL — AB (ref 65–99)

## 2016-07-04 MED ORDER — INSULIN ASPART 100 UNIT/ML ~~LOC~~ SOLN
0.0000 [IU] | Freq: Three times a day (TID) | SUBCUTANEOUS | Status: DC
  Administered 2016-07-05: 7 [IU] via SUBCUTANEOUS
  Administered 2016-07-05: 4 [IU] via SUBCUTANEOUS
  Administered 2016-07-05: 15 [IU] via SUBCUTANEOUS
  Administered 2016-07-06: 1 [IU] via SUBCUTANEOUS
  Administered 2016-07-06: 4 [IU] via SUBCUTANEOUS

## 2016-07-04 MED ORDER — ALLOPURINOL 100 MG PO TABS
100.0000 mg | ORAL_TABLET | Freq: Every day | ORAL | Status: DC
  Administered 2016-07-05 – 2016-07-06 (×2): 100 mg via ORAL
  Filled 2016-07-04 (×2): qty 1

## 2016-07-04 MED ORDER — HALOPERIDOL LACTATE 5 MG/ML IJ SOLN
2.0000 mg | Freq: Once | INTRAMUSCULAR | Status: AC
  Administered 2016-07-04: 2 mg via INTRAVENOUS
  Filled 2016-07-04: qty 1

## 2016-07-04 MED ORDER — PROMETHAZINE HCL 25 MG/ML IJ SOLN
12.5000 mg | Freq: Once | INTRAMUSCULAR | Status: AC
  Administered 2016-07-04: 12.5 mg via INTRAVENOUS
  Filled 2016-07-04: qty 1

## 2016-07-04 MED ORDER — DIPHENHYDRAMINE HCL 25 MG PO CAPS
25.0000 mg | ORAL_CAPSULE | Freq: Once | ORAL | Status: AC
  Administered 2016-07-04: 25 mg via ORAL
  Filled 2016-07-04: qty 1

## 2016-07-04 MED ORDER — POTASSIUM CHLORIDE IN NACL 20-0.9 MEQ/L-% IV SOLN
INTRAVENOUS | Status: DC
  Administered 2016-07-05 – 2016-07-06 (×4): via INTRAVENOUS
  Filled 2016-07-04 (×5): qty 1000

## 2016-07-04 MED ORDER — HYDRALAZINE HCL 20 MG/ML IJ SOLN
10.0000 mg | Freq: Once | INTRAMUSCULAR | Status: AC
  Administered 2016-07-04: 10 mg via INTRAVENOUS
  Filled 2016-07-04: qty 1

## 2016-07-04 MED ORDER — ACETAMINOPHEN 325 MG PO TABS: 650.0000 mg | ORAL_TABLET | Freq: Four times a day (QID) | ORAL | Status: DC | PRN

## 2016-07-04 MED ORDER — METOCLOPRAMIDE HCL 10 MG PO TABS
10.0000 mg | ORAL_TABLET | Freq: Four times a day (QID) | ORAL | Status: DC | PRN
Start: 2016-07-04 — End: 2016-07-05

## 2016-07-04 MED ORDER — DIPHENHYDRAMINE HCL 50 MG/ML IJ SOLN
25.0000 mg | Freq: Once | INTRAMUSCULAR | Status: AC
  Administered 2016-07-04: 25 mg via INTRAVENOUS
  Filled 2016-07-04: qty 1

## 2016-07-04 MED ORDER — ENOXAPARIN SODIUM 40 MG/0.4ML ~~LOC~~ SOLN
40.0000 mg | SUBCUTANEOUS | Status: DC
  Administered 2016-07-05 – 2016-07-06 (×2): 40 mg via SUBCUTANEOUS
  Filled 2016-07-04 (×2): qty 0.4

## 2016-07-04 MED ORDER — IOPAMIDOL (ISOVUE-300) INJECTION 61%
INTRAVENOUS | Status: AC
  Administered 2016-07-04: 100 mL
  Filled 2016-07-04: qty 100

## 2016-07-04 MED ORDER — FENTANYL CITRATE (PF) 100 MCG/2ML IJ SOLN
100.0000 ug | Freq: Once | INTRAMUSCULAR | Status: AC
  Administered 2016-07-04: 100 ug via INTRAVENOUS
  Filled 2016-07-04: qty 2

## 2016-07-04 MED ORDER — INSULIN ASPART 100 UNIT/ML ~~LOC~~ SOLN: 0.0000 [IU] | Freq: Every day | SUBCUTANEOUS | Status: DC

## 2016-07-04 MED ORDER — ONDANSETRON HCL 4 MG/2ML IJ SOLN
4.0000 mg | Freq: Once | INTRAMUSCULAR | Status: AC
  Administered 2016-07-04: 4 mg via INTRAVENOUS
  Filled 2016-07-04: qty 2

## 2016-07-04 MED ORDER — SODIUM CHLORIDE 0.9 % IV SOLN: INTRAVENOUS | Status: DC

## 2016-07-04 MED ORDER — ONDANSETRON HCL 4 MG PO TABS
4.0000 mg | ORAL_TABLET | Freq: Four times a day (QID) | ORAL | Status: DC | PRN
Start: 2016-07-04 — End: 2016-07-06

## 2016-07-04 MED ORDER — DULOXETINE HCL 30 MG PO CPEP
30.0000 mg | ORAL_CAPSULE | Freq: Two times a day (BID) | ORAL | Status: DC
  Administered 2016-07-05 – 2016-07-06 (×3): 30 mg via ORAL
  Filled 2016-07-04 (×3): qty 1

## 2016-07-04 MED ORDER — ONDANSETRON HCL 4 MG/2ML IJ SOLN
4.0000 mg | Freq: Four times a day (QID) | INTRAMUSCULAR | Status: DC | PRN
  Administered 2016-07-05 (×4): 4 mg via INTRAVENOUS
  Filled 2016-07-04 (×4): qty 2

## 2016-07-04 MED ORDER — INSULIN ASPART 100 UNIT/ML ~~LOC~~ SOLN
10.0000 [IU] | Freq: Once | SUBCUTANEOUS | Status: AC
  Administered 2016-07-05: 10 [IU] via SUBCUTANEOUS

## 2016-07-04 MED ORDER — METOPROLOL SUCCINATE ER 25 MG PO TB24
25.0000 mg | ORAL_TABLET | Freq: Every day | ORAL | Status: DC
  Administered 2016-07-05 – 2016-07-06 (×2): 25 mg via ORAL
  Filled 2016-07-04 (×2): qty 1

## 2016-07-04 MED ORDER — SUCRALFATE 1 GM/10ML PO SUSP
1.0000 g | Freq: Three times a day (TID) | ORAL | Status: DC | PRN
  Administered 2016-07-05: 1 g via ORAL
  Filled 2016-07-04: qty 10

## 2016-07-04 MED ORDER — ATORVASTATIN CALCIUM 20 MG PO TABS
20.0000 mg | ORAL_TABLET | Freq: Every day | ORAL | Status: DC
  Administered 2016-07-05: 20 mg via ORAL
  Filled 2016-07-04: qty 1

## 2016-07-04 MED ORDER — ACETAMINOPHEN 650 MG RE SUPP
650.0000 mg | Freq: Four times a day (QID) | RECTAL | Status: DC | PRN
Start: 2016-07-04 — End: 2016-07-06

## 2016-07-04 MED ORDER — METOCLOPRAMIDE HCL 5 MG/ML IJ SOLN
10.0000 mg | Freq: Four times a day (QID) | INTRAMUSCULAR | Status: DC | PRN
  Administered 2016-07-05: 10 mg via INTRAVENOUS
  Filled 2016-07-04: qty 2

## 2016-07-04 MED ORDER — PANTOPRAZOLE SODIUM 40 MG PO TBEC
40.0000 mg | DELAYED_RELEASE_TABLET | Freq: Two times a day (BID) | ORAL | Status: DC
  Administered 2016-07-05 – 2016-07-06 (×3): 40 mg via ORAL
  Filled 2016-07-04 (×3): qty 1

## 2016-07-04 MED ORDER — AMLODIPINE BESYLATE 10 MG PO TABS
10.0000 mg | ORAL_TABLET | Freq: Every day | ORAL | Status: DC
  Administered 2016-07-05 – 2016-07-06 (×2): 10 mg via ORAL
  Filled 2016-07-04 (×2): qty 1

## 2016-07-04 MED ORDER — SODIUM CHLORIDE 0.9 % IV BOLUS (SEPSIS)
1000.0000 mL | Freq: Once | INTRAVENOUS | Status: AC
  Administered 2016-07-04: 1000 mL via INTRAVENOUS

## 2016-07-04 MED ORDER — METOCLOPRAMIDE HCL 5 MG/ML IJ SOLN
10.0000 mg | Freq: Once | INTRAMUSCULAR | Status: AC
  Administered 2016-07-04: 10 mg via INTRAVENOUS
  Filled 2016-07-04: qty 2

## 2016-07-04 NOTE — ED Provider Notes (Signed)
[facility] DEPT Provider Note   [name]: [id number] Arrival date & time: 07/04/16  1319     History   Chief Complaint Chief Complaint  Patient presents with  . Chest Pain  . Abdominal Pain    HPI Deborah [name] is a 51 y.o. female who presents with constant lower abdominal pain and midsternal chest pain that began at 12 AM last night. Patient states that symptoms are similar to her past episodes of gastroparesis but she states her chest pain is a little worse. She took nitroglycerin x1 at home with no relief. She states that both chest and abdominal pain has been constant since onset at midnight. She describes the chest pain as feeling as if "someone is sitting on my chest." Her abdominal pain is worsened with any type of movement and she describes it as a "crampy" pain. She also reports having some episodes of diarrhea and vomiting since symptoms began. She denies any melena or bright red blood in stools and states that emesis is nonbloody and nonbilious. She reports associated nausea and generalized weakness secondary to not being able to tolerate any by mouth today. She denies any recent fever or illness, urinary complaints, vaginal discharge, vaginal pain, vaginal bleeding.     Past Medical History:  Diagnosis Date  . Chest pain 12/2015  . Diabetes mellitus   . Fibromyalgia   . Gastric ulcer   . Gastroparesis   . Gout   . Hyperlipidemia   . Hypertension   . Obesity   . Pneumonia   . Stroke [facility]) 02/2011    Patient Active Problem List   Diagnosis Date Noted  . Intractable nausea and vomiting 06/17/2016  . Diabetic gastroparesis ([place]) 06/05/2016  . Gout 06/05/2016  . AKI (acute kidney injury) ([place]) 12/06/2015  . Hypokalemia 09/26/2015  . Gastroparesis 09/21/2015  . Nausea and vomiting 08/20/2015  . Gout flare 05/27/2015  . Abdominal pain 05/26/2015  . DKA (diabetic ketoacidoses) ([place]) 05/25/2015  . Uncontrolled type 2 diabetes mellitus with diabetic  neuropathy, with long-term current use of insulin ([place]) 05/25/2015  . Dyslipidemia associated with type 2 diabetes mellitus ([place]) 05/25/2015  . CKD (chronic kidney disease), stage II 05/25/2015  . Essential hypertension, benign 09/28/2013    Past Surgical History:  Procedure Laterality Date  . CATARACT EXTRACTION  01/2014  . CHOLECYSTECTOMY      OB History    No data available       Home Medications    Prior to Admission medications   Medication Sig Start Date End Date Taking? Authorizing Provider  albuterol (PROVENTIL HFA;VENTOLIN HFA) 108 (90 Base) MCG/ACT inhaler Inhale 2 puffs into the lungs every 6 (six) hours as needed for wheezing or shortness of breath. 09/23/15  Yes Debbe Odea, MD  allopurinol (ZYLOPRIM) 100 MG tablet Take 1 tablet (100 mg total) by mouth daily. 09/23/15  Yes Debbe Odea, MD  amLODipine (NORVASC) 10 MG tablet Take 1 tablet (10 mg total) by mouth daily. 06/08/16  Yes Nita Sells, MD  atorvastatin (LIPITOR) 20 MG tablet Take 20 mg by mouth at bedtime.   Yes Historical Provider, MD  DULoxetine (CYMBALTA) 30 MG capsule Take 1 capsule (30 mg total) by mouth 2 (two) times daily. 09/23/15  Yes Debbe Odea, MD  metoCLOPramide (REGLAN) 10 MG tablet Take 1 tablet (10 mg total) by mouth 3 (three) times daily before meals. 06/08/16  Yes Nita Sells, MD  metoprolol succinate (TOPROL-XL) 25 MG 24 hr tablet Take 1 tablet (25 mg total)  by mouth daily. 06/08/16  Yes Nita Sells, MD  nitroGLYCERIN (NITROSTAT) 0.4 MG SL tablet Place 1 tablet (0.4 mg total) under the tongue every 5 (five) minutes as needed for chest pain. 12/20/15  Yes Mauricio Gerome Apley, MD  NOVOLOG MIX 70/30 (70-30) 100 UNIT/ML injection 75 units in am, 60 units  lunch, and 50 units pm Patient taking differently: Inject 50-75 Units into the skin See admin instructions. Inject 75 unit subcutaneously with breakfast, 60 units with lunch and 50 units with supper 06/08/16  Yes Nita Sells, MD  pantoprazole (PROTONIX) 40 MG tablet Take 1 tablet (40 mg total) by mouth 2 (two) times daily. 06/08/16  Yes Nita Sells, MD  sucralfate (CARAFATE) 1 g tablet Take 1 tablet (1 g total) by mouth 4 (four) times daily -  with meals and at bedtime. 06/08/16  Yes Nita Sells, MD  bisacodyl (DULCOLAX) 5 MG EC tablet Take 1 tablet (5 mg total) by mouth daily as needed for moderate constipation. Patient not taking: Reported on 07/04/2016 06/19/16   Donne Hazel, MD  neomycin-bacitracin-polymyxin (NEOSPORIN) [phone] ointment Apply topically 3 (three) times daily. To affected areas Patient not taking: Reported on 07/04/2016 06/19/16   Donne Hazel, MD  ondansetron (ZOFRAN ODT) 4 MG disintegrating tablet Take 1 tablet (4 mg total) by mouth every 8 (eight) hours as needed for nausea or vomiting. Patient not taking: Reported on 07/04/2016 06/08/16   Nita Sells, MD    Family History Family History  Problem Relation Age of Onset  . Diabetes Mother   . Diabetes Father   . Heart disease Father   . Diabetes Sister   . Congestive Heart Failure Sister 64  . Diabetes Brother     Social History Social History  Substance Use Topics  . Smoking status: Never Smoker  . Smokeless tobacco: Never Used  . Alcohol use No     Allergies   Lisinopril; Penicillins; Valium [diazepam]; Aspirin; Food; Nsaids; Tylenol [acetaminophen]; Morphine and related; and Tramadol   Review of Systems Review of Systems  Constitutional: Negative for chills and fever.  HENT: Negative for congestion, rhinorrhea and sore throat.   Eyes: Negative for visual disturbance.  Respiratory: Negative for cough and shortness of breath.   Cardiovascular: Positive for chest pain.  Gastrointestinal: Positive for abdominal pain, diarrhea, nausea and vomiting. Negative for blood in stool.  Genitourinary: Negative for dysuria, hematuria, vaginal bleeding, vaginal discharge and vaginal pain.  Musculoskeletal:  Negative for back pain and neck pain.  Skin: Negative for rash.  Neurological: Positive for weakness (generalized). Negative for dizziness, numbness and headaches.  Psychiatric/Behavioral: Negative for confusion.  All other systems reviewed and are negative.    Physical Exam Updated Vital Signs BP (!) 165/93   Pulse (!) 117   Temp 97.9 F (36.6 C) (Oral)   Resp 20   LMP 10/10/2012   SpO2 100%   Physical Exam  Constitutional: She is oriented to person, place, and time. She appears well-developed and well-nourished.  Appears uncomfortable. Intermittently tearful throughout exam.  HENT:  Head: Normocephalic and atraumatic.  Mouth/Throat: Oropharynx is clear and moist. Mucous membranes are dry.  Eyes: Conjunctivae, EOM and lids are normal. Pupils are equal, round, and reactive to light.  Neck: Full passive range of motion without pain.  Cardiovascular: Normal rate, regular rhythm, normal heart sounds and normal pulses.  Exam reveals no gallop and no friction rub.   No murmur heard. Pulmonary/Chest: Effort normal and breath sounds normal.  Pain is  reproduced with palpation of anterior chest wall and midsternal region. Pain is not brought on by abduction or abduction of her bilateral upper extremities.  Abdominal: Soft. Normal appearance. Bowel sounds are decreased. There is generalized tenderness. There is no rigidity and no guarding.  Generalized abdominal tenderness. No focal tenderness no peritoneal signs. External, past medical history diabetes, hypertension  Musculoskeletal: Normal range of motion.  Neurological: She is alert and oriented to person, place, and time.  Skin: Skin is warm and dry. Capillary refill takes less than 2 seconds.  Psychiatric: She has a normal mood and affect. Her speech is normal.  Nursing note and vitals reviewed.    ED Treatments / Results  Labs (all labs ordered are listed, but only abnormal results are displayed) Labs Reviewed  CBC - Abnormal;  Notable for the following:       Result Value   WBC 11.0 (*)    Hemoglobin 10.8 (*)    HCT 33.6 (*)    All other components within normal limits  COMPREHENSIVE METABOLIC PANEL - Abnormal; Notable for the following:    Sodium 134 (*)    CO2 18 (*)    Glucose, Bld 289 (*)    Creatinine, Ser 1.15 (*)    Calcium 8.8 (*)    GFR calc non Af Amer 54 (*)    All other components within normal limits  LIPASE, BLOOD  URINALYSIS, ROUTINE W REFLEX MICROSCOPIC  RAPID URINE DRUG SCREEN, HOSP PERFORMED  I-STAT TROPOININ, ED    EKG  EKG Interpretation  Date/Time:  Sunday July 04 2016 13:27:06 EDT Ventricular Rate:  118 PR Interval:    QRS Duration: 83 QT Interval:  323 QTC Calculation: 453 R Axis:   -35 Text Interpretation:  Sinus tachycardia Left axis deviation When compared with ECG of 06/18/2015 No significant change was found Confirmed by MCMANUS  MD, KATHLEEN ([id number]) on 07/04/2016 2:32:19 PM       Radiology Dg Chest 2 View  Result Date: 07/04/2016 CLINICAL DATA:  Chest pain EXAM: CHEST  2 VIEW COMPARISON:  None. FINDINGS: The heart size and mediastinal contours are within normal limits. Both lungs are clear. The visualized skeletal structures are unremarkable. IMPRESSION: No active cardiopulmonary disease. Electronically Signed   By: Hetal  Patel   On: 07/04/2016 14:21    Procedures Procedures (including critical care time)  Medications Ordered in ED Medications  0.9 %  sodium chloride infusion (not administered)  sodium chloride 0.9 % bolus 1,000 mL (0 mLs Intravenous Stopped 07/04/16 1625)  haloperidol lactate (HALDOL) injection 2 mg (2 mg Intravenous Given 07/04/16 1457)  promethazine (PHENERGAN) injection 12.5 mg (12.5 mg Intravenous Given 07/04/16 1457)  diphenhydrAMINE (BENADRYL) capsule 25 mg (25 mg Oral Given 07/04/16 1623)  metoCLOPramide (REGLAN) injection 10 mg (10 mg Intravenous Given 07/04/16 1818)  diphenhydrAMINE (BENADRYL) injection 25 mg (25 mg Intravenous Given  07/04/16 1821)  ondansetron (ZOFRAN) injection 4 mg (4 mg Intravenous Given 07/04/16 2028)  fentaNYL (SUBLIMAZE) injection 100 mcg (100 mcg Intravenous Given 07/04/16 2042)  hydrALAZINE (APRESOLINE) injection 10 mg (10 mg Intravenous Given 07/04/16 2126)     Initial Impression / Assessment and Plan / ED Course  I have reviewed the triage vital signs and the nursing notes.  Pertinent labs & imaging results that were available during my care of the patient were reviewed by me and considered in my medical decision making (see chart for details).     51  year old female with past medical history of diabetes and gastroparesis presents  with abdominal pain and midsternal chest pain that began 12 AM last night. Clarified with patient and she states that her chest pain and abdominal pain have been constant since 12 AM with no resolution of pain since. Physical exam was some diffuse tenderness but no focal point tenderness. Chest Pain reproduced with palpation of anterior chest. Patient is afebrile, non-toxic appearing. Consider persistent gastroparesis vs acute infectious etiology. Also consider ACS. Will plan to check basic labs including CBC, CMP, and lipase. Will evaluate ACS with troponin and EKG. Patient given haldol and phenergan for pain and nausea/relief.   Re-evaluation: Patient reports no improvement in pain after Haldol and Benadryl. She explained that usually they give her pain medications and she comes the emergency Department. Discussed with patient that gastroparesis is not treated with narcotics. We will not give any in the department currently. Labs pending.   Patient has ambulated to the bathroom while in the department without difficulty.   Labs and imaging reviewed. Initial EKG shows sinus tach. No ST elevations concerns for MI. Compared to EKG on 06/17/16, no significant change. Discussed with patient again and clarified whether or not her pain had been intermittent or constant since onset.  She reaffirms that chest pain had been constant since 12 AM with with no pain-free period. Given onset of symptoms and initial negative troponin rules out acute ACS. CBC with mildly elevated white count. Laboratory results discussed with patient. Patient reports that she still having pain. Discussed with her potential options for controlling pain. Patient states that they usually give her Dilaudid when she comes in for pain and she knows that works for her. Explained her that woll not treat the gastroparesis with narcotics and that we will try other medications.. Will give Reglan and Benadryl for further pain control. Will give oral fluids see if she can tolerate.  6:47 PM: Re-evaluated patient after Reglan and Benadryl. She states that she is still having pain and "That ya'll are not doing anything about it." She also states she has had vomiting since being here. Explained her that we have tried several medications in order to treat the pain. Patient states, "if you will just do what I told you works, I'll be fine." Explained again that we will not gastroparesis with narcotics. Patient states she is having one episode of vomiting since being here and feels nauseous again. Explained to patient that if she cannot tolerate by mouth in the department states that pain to be admitted for further evaluation. Patient says "I wouldn't have to if ya'll would give me what I told ya'll I need." Discussed with patient that I will try some additional medications to see if we can provide her any relief.   Re-evaluation: Patient is still having pain and nausea despite medications. Will give fentanyl and Zofran for pain and nausea/vomiting.   Re-evaluation: Patient still eporting bdominal pain and vomiting, and denies any improvement in symptoms, despite multiple medications. She has not been tolerate PO in the department. Discussed with patient and she is still asking for Dilaudid to help with pain. Explained to her er that  if we cannot get the pain and vomiting under control we will have to admit her. Patient is agreeable to plan. Will contact hospitalist for admission.  9:27 PM: Discussed with hospitalist. Will admit for intractable vomiting and abdominal pain.    Final Clinical Impressions(s) / ED Diagnoses   Final diagnoses:  Intractable vomiting with nausea, unspecified vomiting type  Abdominal pain, unspecified abdominal location  New Prescriptions New Prescriptions   No medications on file        Volanda Napoleon, PA-C 07/04/16 [place], DO 07/07/16 1951

## 2016-07-04 NOTE — ED Notes (Signed)
Pt returned from xray

## 2016-07-04 NOTE — ED Notes (Signed)
Asked pt if she could give a urine sample, unable to go at this time.

## 2016-07-04 NOTE — ED Triage Notes (Signed)
Per [facility], pt from home, c/o CP since midngith off and on, and lower abdominal pain, nausea vomiting. Was seen at [facility] a few weeks ago for gastroparesis. Sinus tach 130, pressure 160/92, 100% room air, clear lung sounds. Sugar 298. No asa, hx of ulcers, pt took 1 nitro at home without relief. Pt tearful

## 2016-07-04 NOTE — ED Notes (Signed)
Pt vomited X1 in room. This writer helped clean up pt.

## 2016-07-04 NOTE — H&P (Signed)
History and Physical  Patient Name: Deborah [name]     TSV:[id number]    DOB: October 28, 1965    DOA: 07/04/2016 PCP: Nolon Bussing, PA   Patient coming from: Home  Chief Complaint: Epigastric and abdominal pain, vomiting  HPI: Deborah [name] is a 51 y.o. female with a past medical history significant for IDDM poorly controlled, gastroparesis, HTN, hx of CVA who presents with 1 day epigastric pain.  The been admitted 3 times in the last 3 months for epigastric pain, abdominal pain, chest pain, and nausea and vomiting. She's had 2 negative CT angiograms of the chest, she had a Myoview last October that was low risk, she had negative abdominal x-rays, she had a gastric emptying study that was normal on Reglan, and she has been seen by GI diagnosed with gastroparesis, and discharged on Reglan and sucralfate. Since her last discharge, she has been back to her normal state of health, ambulatory, functional, but last night around midnight she developed recurrence of her constant, severe, central epigastric abdominal pain. This persisted through the day, did not resolve with Reglan at home and she came to the emergency room tonight because she did not have pain medicine at home. She no longer takes Bentyl.  ED course: -Afebrile, heart rate 118, respirations and pulse ox normal, blood pressure 140/96 -Na 134, K 3.5, Cr 1.15 (baseline 1.0), WBC 11K, Hgb 10.8, glucose 29 -Lipase normal -Troponin negative after 12 hours of pain -Chest x-ray clear -ECG showed sinus tachycardia, old LAFB -She was given 1 L of fluids and antiemetics from all 3 classes, and had no relief and still complained of 10 out of 10 pain in her epigastrium and to TRH to evaluate for intractable nausea and vomiting          ROS: Review of Systems  Constitutional: Negative for chills and fever.  Respiratory: Negative for cough, hemoptysis, sputum production, shortness of breath and wheezing.   Cardiovascular:  Positive for chest pain. Negative for palpitations, orthopnea and leg swelling.  Gastrointestinal: Positive for abdominal pain, diarrhea, nausea and vomiting. Negative for blood in stool, heartburn and melena.  All other systems reviewed and are negative.         Past Medical History:  Diagnosis Date  . Chest pain 12/2015  . Diabetes mellitus   . Fibromyalgia   . Gastric ulcer   . Gastroparesis   . Gout   . Hyperlipidemia   . Hypertension   . Obesity   . Pneumonia   . Stroke [facility]) 02/2011    Past Surgical History:  Procedure Laterality Date  . CATARACT EXTRACTION  01/2014  . CHOLECYSTECTOMY      Social History:   reports that she has never smoked. She has never used smokeless tobacco. She reports that she does not drink alcohol or use drugs.  Allergies  Allergen Reactions  . Lisinopril Anaphylaxis    Tongue and mouth swelling  . Penicillins Palpitations    Has patient had a PCN reaction causing immediate rash, facial/tongue/throat swelling, SOB or lightheadedness with hypotension: Yes, heart races Has patient had a PCN reaction causing severe rash involving mucus membranes or skin necrosis: No Has patient had a PCN reaction that required hospitalization: Yes  Has patient had a PCN reaction occurring within the last 10 years: No   . Valium [Diazepam] [name]  . Aspirin Other (See Comments)    Irritates stomach ulcer   . Food Hives and Swelling    Carrots, ketchup   .  Nsaids Other (See Comments)    ULCER  . Tylenol [Acetaminophen] Other (See Comments)    Irritates stomach ulcer   . Morphine And Related Palpitations  . Tramadol Nausea And Vomiting    Family history: family history includes Congestive Heart Failure (age of onset: 60) in her sister; Diabetes in her brother, father, mother, and sister; Heart disease in her father.  Prior to Admission medications   Medication Sig Start Date End Date Taking? Authorizing Provider  albuterol (PROVENTIL  HFA;VENTOLIN HFA) 108 (90 Base) MCG/ACT inhaler Inhale 2 puffs into the lungs every 6 (six) hours as needed for wheezing or shortness of breath. 09/23/15  Yes Debbe Odea, MD  allopurinol (ZYLOPRIM) 100 MG tablet Take 1 tablet (100 mg total) by mouth daily. 09/23/15  Yes Debbe Odea, MD  amLODipine (NORVASC) 10 MG tablet Take 1 tablet (10 mg total) by mouth daily. 06/08/16  Yes Nita Sells, MD  atorvastatin (LIPITOR) 20 MG tablet Take 20 mg by mouth at bedtime.   Yes Historical Provider, MD  DULoxetine (CYMBALTA) 30 MG capsule Take 1 capsule (30 mg total) by mouth 2 (two) times daily. 09/23/15  Yes Debbe Odea, MD  metoCLOPramide (REGLAN) 10 MG tablet Take 1 tablet (10 mg total) by mouth 3 (three) times daily before meals. 06/08/16  Yes Nita Sells, MD  metoprolol succinate (TOPROL-XL) 25 MG 24 hr tablet Take 1 tablet (25 mg total) by mouth daily. 06/08/16  Yes Nita Sells, MD  nitroGLYCERIN (NITROSTAT) 0.4 MG SL tablet Place 1 tablet (0.4 mg total) under the tongue every 5 (five) minutes as needed for chest pain. 12/20/15  Yes Mauricio Gerome Apley, MD  NOVOLOG MIX 70/30 (70-30) 100 UNIT/ML injection 75 units in am, 60 units  lunch, and 50 units pm Patient taking differently: Inject 50-75 Units into the skin See admin instructions. Inject 75 unit subcutaneously with breakfast, 60 units with lunch and 50 units with supper 06/08/16  Yes Nita Sells, MD  pantoprazole (PROTONIX) 40 MG tablet Take 1 tablet (40 mg total) by mouth 2 (two) times daily. 06/08/16  Yes Nita Sells, MD  sucralfate (CARAFATE) 1 g tablet Take 1 tablet (1 g total) by mouth 4 (four) times daily -  with meals and at bedtime. 06/08/16  Yes Nita Sells, MD  ondansetron (ZOFRAN ODT) 4 MG disintegrating tablet Take 1 tablet (4 mg total) by mouth every 8 (eight) hours as needed for nausea or vomiting. Patient not taking: Reported on 07/04/2016 06/08/16   Nita Sells, MD       Physical  Exam: BP (!) 165/93   Pulse (!) 117   Temp 97.9 F (36.6 C) (Oral)   Resp 20   LMP 10/10/2012   SpO2 100%  General appearance: Obese adult female, alert and in moderate distress from pain.   Eyes: Anicteric, conjunctiva pink, lids and lashes normal. PERRL.    ENT: No nasal deformity, discharge, epistaxis.  Hearing normal. OP moist without lesions.   Neck: No neck masses.  Trachea midline.  No thyromegaly/tenderness. Lymph: No cervical or supraclavicular lymphadenopathy. Skin: Warm and dry.  No suspicious rashes or lesions. Cardiac: Tachycardic, regular, nl S1-S2, no murmurs appreciated.  Capillary refill is brisk.  JVP not visible.  No LE edema.  Radial and DP pulses 2+ and symmetric. Respiratory: Normal respiratory rate and rhythm.  CTAB without rales or wheezes. Abdomen: Abdomen soft.  Moderate diffuse TTP, without guarding or rigidity or rebound.  No distension, tympany. No ascites, distension, hepatosplenomegaly.   MSK: No  deformities or effusions.  No cyanosis or clubbing. Neuro: Cranial nerves normal.  Sensation intact to light touch. Speech is fluent.  Muscle strength sluggish but symmetric.    Psych: Sensorium intact and responding to questions, attention normal.  Behavior appropriate.  Affect blunted.  Judgment and insight appear normal.     Labs on Admission:  I have personally reviewed following labs and imaging studies: CBC:  Recent Labs Lab 07/04/16 1337  WBC 11.0*  HGB 10.8*  HCT 33.6*  MCV 86.4  PLT 097   Basic Metabolic Panel:  Recent Labs Lab 07/04/16 1337  NA 134*  K 3.5  CL 103  CO2 18*  GLUCOSE 289*  BUN 10  CREATININE 1.15*  CALCIUM 8.8*   GFR: CrCl cannot be calculated (Unknown ideal weight.).  Liver Function Tests:  Recent Labs Lab 07/04/16 1337  AST 27  ALT 22  ALKPHOS 96  BILITOT 0.7  PROT 7.6  ALBUMIN 3.6    Recent Labs Lab 07/04/16 1337  LIPASE 23   No results for input(s): AMMONIA in the last 168 hours. Coagulation  Profile: No results for input(s): INR, PROTIME in the last 168 hours. Cardiac Enzymes: No results for input(s): CKTOTAL, CKMB, CKMBINDEX, TROPONINI in the last 168 hours. BNP (last 3 results) No results for input(s): PROBNP in the last 8760 hours. HbA1C: No results for input(s): HGBA1C in the last 72 hours. CBG: No results for input(s): GLUCAP in the last 168 hours. Lipid Profile: No results for input(s): CHOL, HDL, LDLCALC, TRIG, CHOLHDL, LDLDIRECT in the last 72 hours. Thyroid Function Tests: No results for input(s): TSH, T4TOTAL, FREET4, T3FREE, THYROIDAB in the last 72 hours. Anemia Panel: No results for input(s): VITAMINB12, FOLATE, FERRITIN, TIBC, IRON, RETICCTPCT in the last 72 hours. Sepsis Labs:  Invalid input(s): PROCALCITONIN, LACTICIDVEN No results found for this or any previous visit (from the past 240 hour(s)).       Radiological Exams on Admission: Personally reviewed CXR shows no focal opacity: Dg Chest 2 View  Result Date: 07/04/2016 CLINICAL DATA:  Chest pain EXAM: CHEST  2 VIEW COMPARISON:  None. FINDINGS: The heart size and mediastinal contours are within normal limits. Both lungs are clear. The visualized skeletal structures are unremarkable. IMPRESSION: No active cardiopulmonary disease. Electronically Signed   By: Kathreen Devoid   On: 07/04/2016 14:21    EKG: Independently reviewed. Raet 118, QTc 453, sinus rhythm, old LAFB.  Echocardiogram 2018 Feb: Report reviewed EF 60-65% No significant valvular disease  NM Myoview Report reviewed, October 2017, low risk    Assessment/Plan  1. Intractable nausea and vomiting:  Differential includes gastroparesis (most likely), SBO.  Less likely pancreatitis or diverticulitis.  Low concern for perf given exam.   -CT of the abdomen and pelvis with IV contrast -Zofran or Reglan for nausea -Sucralfate for pain -IV fluids with K -May have clears -No opiates   2. Insulin-dependent diabetes, poorly controlled:   On large doses 70/30 TID at home -Glargine 40 units daily here -High dose SSI  3. Hypertension history of CVA:  Slightly hypertensive at admission -Continue metoprolol, amlodipine -Continue statin  4. History of gout:  Not active -Continue allopurinol  5. Anemia:  Stable  6. Mild non-gap acidosis:  Presumably from diarrhea. -Trend BMP  7. Other medications:  -Continue PPI -Continue duloxetine            DVT prophylaxis: Lovenox  Code Status: FULL  Family Communication: None present  Disposition Plan: Anticipate IV fluids, anti-emetics, CT abdomen.  If CT normal, conservative mgmt of gastroparesis with IVF antiemetics, likely home within 24 hours. Consults called: None Admission status: OBS At the point of initial evaluation, it is my clinical opinion that admission for OBSERVATION is reasonable and necessary because the patient's presenting complaints in the context of their chronic conditions represent sufficient risk of deterioration or significant morbidity to constitute reasonable grounds for close observation in the hospital setting, but that the patient may be medically stable for discharge from the hospital within 24 to 48 hours.    Medical decision making: Patient seen at 9:20PM on 07/04/2016.  The patient was discussed with Drenda Freeze, PA-C.  What exists of the patient's chart was reviewed in depth and summarized above.  Clinical condition: stable.        Edwin Dada [facility] Pager [phone]

## 2016-07-04 NOTE — ED Notes (Signed)
Pt given sip of water and left at bedside. Pt drowsy on assessment, RN had to tap pt multiple times to wake her up and ask her about pain and nausea.

## 2016-07-04 NOTE — ED Notes (Signed)
Pt vomited large amount of liquid before phenergan. Will postpone PO Trial until medicine kicks in

## 2016-07-05 LAB — COMPREHENSIVE METABOLIC PANEL
ALT: 21 U/L (ref 14–54)
ANION GAP: 11 (ref 5–15)
AST: 23 U/L (ref 15–41)
Albumin: 3.4 g/dL — ABNORMAL LOW (ref 3.5–5.0)
Alkaline Phosphatase: 84 U/L (ref 38–126)
BUN: 12 mg/dL (ref 6–20)
CHLORIDE: 105 mmol/L (ref 101–111)
CO2: 20 mmol/L — AB (ref 22–32)
CREATININE: 1.1 mg/dL — AB (ref 0.44–1.00)
Calcium: 8.4 mg/dL — ABNORMAL LOW (ref 8.9–10.3)
GFR, EST NON AFRICAN AMERICAN: 57 mL/min — AB (ref >60)
Glucose, Bld: 332 mg/dL — ABNORMAL HIGH (ref 65–99)
POTASSIUM: 3.6 mmol/L (ref 3.5–5.1)
SODIUM: 136 mmol/L (ref 135–145)
Total Bilirubin: 0.6 mg/dL (ref 0.3–1.2)
Total Protein: 7.3 g/dL (ref 6.5–8.1)

## 2016-07-05 LAB — GLUCOSE, CAPILLARY
GLUCOSE-CAPILLARY: 207 mg/dL — AB (ref 65–99)
GLUCOSE-CAPILLARY: 152 mg/dL — AB (ref 65–99)
Glucose-Capillary: 344 mg/dL — ABNORMAL HIGH (ref 65–99)
Glucose-Capillary: 197 mg/dL — ABNORMAL HIGH (ref 65–99)

## 2016-07-05 LAB — MAGNESIUM
MAGNESIUM: 1.6 mg/dL — AB (ref 1.7–2.4)
Magnesium: 0.9 mg/dL — CL (ref 1.7–2.4)

## 2016-07-05 LAB — CBC
HEMATOCRIT: 30.4 % — AB (ref 36.0–46.0)
HEMOGLOBIN: 10.1 g/dL — AB (ref 12.0–15.0)
MCH: 28.4 pg (ref 26.0–34.0)
MCHC: 33.2 g/dL (ref 30.0–36.0)
MCV: 85.4 fL (ref 78.0–100.0)
PLATELETS: 370 10*3/uL (ref 150–400)
RBC: 3.56 MIL/uL — AB (ref 3.87–5.11)
RDW: 14.4 % (ref 11.5–15.5)
WBC: 10.1 10*3/uL (ref 4.0–10.5)

## 2016-07-05 MED ORDER — INSULIN ASPART PROT & ASPART (70-30 MIX) 100 UNIT/ML ~~LOC~~ SUSP
40.0000 [IU] | Freq: Two times a day (BID) | SUBCUTANEOUS | Status: DC
  Administered 2016-07-05 (×2): 40 [IU] via SUBCUTANEOUS
  Filled 2016-07-05: qty 10

## 2016-07-05 MED ORDER — BISACODYL 10 MG RE SUPP: 10.0000 mg | Freq: Every day | RECTAL | Status: DC | PRN

## 2016-07-05 MED ORDER — HYOSCYAMINE SULFATE 0.125 MG SL SUBL
0.2500 mg | SUBLINGUAL_TABLET | Freq: Once | SUBLINGUAL | Status: AC
  Administered 2016-07-05: 0.25 mg via SUBLINGUAL
  Filled 2016-07-05: qty 2

## 2016-07-05 MED ORDER — METOCLOPRAMIDE HCL 10 MG PO TABS
10.0000 mg | ORAL_TABLET | Freq: Three times a day (TID) | ORAL | Status: DC
  Administered 2016-07-05 – 2016-07-06 (×4): 10 mg via ORAL
  Filled 2016-07-05 (×3): qty 1

## 2016-07-05 MED ORDER — METOPROLOL TARTRATE 5 MG/5ML IV SOLN: 5.0000 mg | Freq: Four times a day (QID) | INTRAVENOUS | Status: DC | PRN

## 2016-07-05 MED ORDER — GI COCKTAIL ~~LOC~~
30.0000 mL | Freq: Three times a day (TID) | ORAL | Status: DC | PRN
  Administered 2016-07-05: 30 mL via ORAL
  Filled 2016-07-05: qty 30

## 2016-07-05 MED ORDER — HYOSCYAMINE SULFATE 0.125 MG SL SUBL
0.2500 mg | SUBLINGUAL_TABLET | Freq: Once | SUBLINGUAL | Status: DC
  Filled 2016-07-05: qty 2

## 2016-07-05 MED ORDER — MAGNESIUM SULFATE 2 GM/50ML IV SOLN
2.0000 g | Freq: Once | INTRAVENOUS | Status: AC
  Administered 2016-07-05: 2 g via INTRAVENOUS
  Filled 2016-07-05: qty 50

## 2016-07-05 MED ORDER — HYOSCYAMINE SULFATE 0.125 MG PO TABS
0.2500 mg | ORAL_TABLET | Freq: Once | ORAL | Status: DC
  Filled 2016-07-05: qty 2

## 2016-07-05 MED ORDER — GI COCKTAIL ~~LOC~~
30.0000 mL | Freq: Once | ORAL | Status: AC
  Administered 2016-07-05: 30 mL via ORAL
  Filled 2016-07-05: qty 30

## 2016-07-05 NOTE — Progress Notes (Signed)
CRITICAL VALUE ALERT  Critical value received: Magnesium: 0.9  Date of notification: 07/05/16  Time of notification: 0940  Critical value read back: Yes  Nurse who received alert: Cardell Peach   MD notified (1st page): Eliseo Squires  Time of first page: [phone]  Responding MD: Eliseo Squires  Time MD responded:  [phone]  Orders placed and followed. Will continue to monitor.

## 2016-07-05 NOTE — Progress Notes (Signed)
New order for hyoscyamine placed. Pt declines this medication stating that it gave her indigestion when it was administered last time. Pt re-educated that it is ordered no narcotics at this time. Pt also declines tylenol because she reports it "will affect her ulcer". Dorthey Sawyer, RN

## 2016-07-05 NOTE — Progress Notes (Signed)
Spoke with patient regarding her abdominal pain of 8/10 generalized. Patient stated she's been in pain for 2 days.  Patient aware of her diagnosis and what pain medication does to gastric motility. Will call NP to see what kind of alternative she can have.

## 2016-07-05 NOTE — Progress Notes (Signed)
Patient is complaining of burning pain in abdomen unrelieved by Carafate. MD notified. Orders placed. Will continue to monitor.

## 2016-07-05 NOTE — Progress Notes (Signed)
PROGRESS NOTE    [name]  CXK:[id number] DOB: [birth year] DOA: 07/04/2016 PCP: Nolon Bussing, PA   Outpatient Specialists:     Brief Narrative:    Deborah [name] is a 51 y.o. female with a past medical history significant for IDDM poorly controlled, gastroparesis, HTN, hx of CVA who presents with 1 day epigastric pain.  The been admitted 5 times in the last 6 months for epigastric pain (here at [facility]-- has also been admitted at other facilities), abdominal pain, chest pain, and nausea and vomiting. She's had 2 negative CT angiograms of the chest, she had a Myoview last October that was low risk, she had negative abdominal x-rays, she had a gastric emptying study that was normal on Reglan, and she has been seen by GI diagnosed with gastroparesis, and discharged on Reglan and sucralfate. Since her last discharge, she has been back to her normal state of health, ambulatory, functional, but last night around midnight she developed recurrence of her constant, severe, central epigastric abdominal pain. This persisted through the day, did not resolve with Reglan at home and she came to the emergency room tonight because she did not have pain medicine at home.  She states sugars are in the 200s at home.  Assessment & Plan:   Principal Problem:   Intractable nausea and vomiting Active Problems:   Essential hypertension, benign   Uncontrolled type 2 diabetes mellitus with diabetic neuropathy, with long-term current use of insulin (HCC)   CKD (chronic kidney disease), stage II   Diabetic gastroparesis (HCC)   Gout    Intractable nausea and vomiting:  Due to gastroparesis -CT of the abdomen and pelvis with IV contrast: mild hepatic steatosis -Reglan schedule and PRN zofran -Sucralfate -IV fluids with K -check Mg and replete to 2 -May have clears -No opiates as this will worsen gastroparesis (given fentanyl last PM)  Insulin-dependent diabetes, poorly controlled:    On large doses 70/30 TID at home -resume low dose 70/30 -HgbA1C pending -High dose SSI  Hypertension history of CVA:  Slightly hypertensive at admission -Continue metoprolol, amlodipine -Continue statin  History of gout:  Not active -Continue allopurinol  Anemia:  Stable  Mild non-gap acidosis:  Presumably from diarrhea. -Trend BMP  GERD -PPI     DVT prophylaxis:  Lovenox  Code Status: Full Code   Family Communication:   Disposition Plan:     Consultants:      Subjective: Demanding dilaudid for pain   Objective: Vitals:   07/04/16 1845 07/04/16 2030 07/04/16 2145 07/05/16 0528  BP: (!) 152/86 (!) 165/93 133/77 (!) 146/88  Pulse: (!) 115 (!) 117 (!) 120 (!) 101  Resp: 18 20  19   Temp:    97.9 F (36.6 C)  TempSrc:    Oral  SpO2: 98% 100% 98% 98%    Intake/Output Summary (Last 24 hours) at 07/05/16 0844 Last data filed at 07/05/16 0530  Gross per 24 hour  Intake          1516.67 ml  Output                0 ml  Net          1516.67 ml   There were no vitals filed for this visit.  Examination:  General exam: irritated after not getting IV dilaudid-- calm initiallly Respiratory system: refused Cardiovascular system: refused Gastrointestinal system: refused Central nervous system: Alert- no focal deficits Skin: No rashes, lesions or ulcers    Data Reviewed:  I have personally reviewed following labs and imaging studies  CBC:  Recent Labs Lab 07/04/16 1337 07/05/16 0448  WBC 11.0* 10.1  HGB 10.8* 10.1*  HCT 33.6* 30.4*  MCV 86.4 85.4  PLT 400 528   Basic Metabolic Panel:  Recent Labs Lab 07/04/16 1337 07/05/16 0448  NA 134* 136  K 3.5 3.6  CL 103 105  CO2 18* 20*  GLUCOSE 289* 332*  BUN 10 12  CREATININE 1.15* 1.10*  CALCIUM 8.8* 8.4*   GFR: CrCl cannot be calculated (Unknown ideal weight.). Liver Function Tests:  Recent Labs Lab 07/04/16 1337 07/05/16 0448  AST 27 23  ALT 22 21  ALKPHOS 96 84   BILITOT 0.7 0.6  PROT 7.6 7.3  ALBUMIN 3.6 3.4*    Recent Labs Lab 07/04/16 1337  LIPASE 23   No results for input(s): AMMONIA in the last 168 hours. Coagulation Profile: No results for input(s): INR, PROTIME in the last 168 hours. Cardiac Enzymes: No results for input(s): CKTOTAL, CKMB, CKMBINDEX, TROPONINI in the last 168 hours. BNP (last 3 results) No results for input(s): PROBNP in the last 8760 hours. HbA1C: No results for input(s): HGBA1C in the last 72 hours. CBG:  Recent Labs Lab 07/04/16 2241 07/05/16 0802  GLUCAP 328* 344*   Lipid Profile: No results for input(s): CHOL, HDL, LDLCALC, TRIG, CHOLHDL, LDLDIRECT in the last 72 hours. Thyroid Function Tests: No results for input(s): TSH, T4TOTAL, FREET4, T3FREE, THYROIDAB in the last 72 hours. Anemia Panel: No results for input(s): VITAMINB12, FOLATE, FERRITIN, TIBC, IRON, RETICCTPCT in the last 72 hours. Urine analysis:    Component Value Date/Time   COLORURINE AMBER (A) 06/18/2016 0040   APPEARANCEUR CLOUDY (A) 06/18/2016 0040   LABSPEC 1.014 06/18/2016 0040   PHURINE 5.0 06/18/2016 0040   GLUCOSEU >=500 (A) 06/18/2016 0040   HGBUR SMALL (A) 06/18/2016 0040   BILIRUBINUR NEGATIVE 06/18/2016 0040   KETONESUR NEGATIVE 06/18/2016 0040   PROTEINUR 100 (A) 06/18/2016 0040   UROBILINOGEN 0.2 10/02/2013 2108   NITRITE NEGATIVE 06/18/2016 0040   LEUKOCYTESUR SMALL (A) 06/18/2016 0040     )No results found for this or any previous visit (from the past 240 hour(s)).    Anti-infectives    None       Radiology Studies: Dg Chest 2 View  Result Date: 07/04/2016 CLINICAL DATA:  Chest pain EXAM: CHEST  2 VIEW COMPARISON:  None. FINDINGS: The heart size and mediastinal contours are within normal limits. Both lungs are clear. The visualized skeletal structures are unremarkable. IMPRESSION: No active cardiopulmonary disease. Electronically Signed   By: Kathreen Devoid   On: 07/04/2016 14:21   Ct Abdomen Pelvis W  Contrast  Result Date: 07/05/2016 CLINICAL DATA:  Abdominal pain tonight. EXAM: CT ABDOMEN AND PELVIS WITH CONTRAST TECHNIQUE: Multidetector CT imaging of the abdomen and pelvis was performed using the standard protocol following bolus administration of intravenous contrast. CONTRAST:  153mL ISOVUE-300 IOPAMIDOL (ISOVUE-300) INJECTION 61% COMPARISON:  Multiple prior CT examinations dating back to 02/20/2015 FINDINGS: Lower chest: No acute abnormality. Hepatobiliary: There is mild fatty infiltration of the liver without focal lesion. There is cholecystectomy. Bile ducts are normal. Pancreas: Unremarkable. No pancreatic ductal dilatation or surrounding inflammatory changes. Spleen: Normal in size without focal abnormality. Adrenals/Urinary Tract: Adrenal glands are unremarkable. Kidneys are normal, without renal calculi, focal lesion, or hydronephrosis. Bladder is unremarkable. Stomach/Bowel: Stomach is within normal limits. Appendix appears normal. No evidence of bowel wall thickening, distention, or inflammatory changes. Vascular/Lymphatic: No significant vascular  findings are present. No enlarged abdominal or pelvic lymph nodes. Reproductive: Unchanged 5 cm mass at the left lateral aspect of the uterus, probably an exophytic fibroid. Other: No focal inflammation.  No ascites. Musculoskeletal: No significant skeletal lesion. IMPRESSION: Mild hepatic steatosis. Unchanged 5 cm mass of the left lateral aspect of the uterus, most likely an exophytic fibroid. No focal inflammation. No ascites. Electronically Signed   By: Andreas Newport M.D.   On: 07/05/2016 00:22        Scheduled Meds: . allopurinol  100 mg Oral Daily  . amLODipine  10 mg Oral Daily  . atorvastatin  20 mg Oral QHS  . DULoxetine  30 mg Oral BID  . enoxaparin (LOVENOX) injection  40 mg Subcutaneous Q24H  . insulin aspart  0-20 Units Subcutaneous TID WC  . insulin aspart  0-5 Units Subcutaneous QHS  . insulin aspart protamine- aspart  40  Units Subcutaneous BID WC  . metoCLOPramide  10 mg Oral TID AC & HS  . metoprolol succinate  25 mg Oral Daily  . pantoprazole  40 mg Oral BID   Continuous Infusions: . 0.9 % NaCl with KCl 20 mEq / L 125 mL/hr at 07/05/16 0122     LOS: 0 days    Time spent: 25 min-- majority of time spent counseling patient regarding the nature of gastroparesis and how opioids are not indicated    Rachel, DO [facility] Pager [phone]  If 7PM-7AM, please contact night-coverage [email or web address] Password [facility] 07/05/2016, 8:44 AM

## 2016-07-06 LAB — RAPID URINE DRUG SCREEN, HOSP PERFORMED
AMPHETAMINES: NOT DETECTED
BENZODIAZEPINES: NOT DETECTED
Barbiturates: NOT DETECTED
Cocaine: NOT DETECTED
Opiates: NOT DETECTED
TETRAHYDROCANNABINOL: NOT DETECTED

## 2016-07-06 LAB — URINALYSIS, ROUTINE W REFLEX MICROSCOPIC
Bacteria, UA: NONE SEEN
Bilirubin Urine: NEGATIVE
GLUCOSE, UA: 50 mg/dL — AB
Ketones, ur: NEGATIVE mg/dL
LEUKOCYTES UA: NEGATIVE
Nitrite: NEGATIVE
PH: 5 (ref 5.0–8.0)
Protein, ur: 100 mg/dL — AB
SPECIFIC GRAVITY, URINE: 1.017 (ref 1.005–1.030)

## 2016-07-06 LAB — HEMOGLOBIN A1C
Hgb A1c MFr Bld: 9.7 % — ABNORMAL HIGH (ref 4.8–5.6)
MEAN PLASMA GLUCOSE: 232 mg/dL

## 2016-07-06 LAB — GLUCOSE, CAPILLARY
Glucose-Capillary: 149 mg/dL — ABNORMAL HIGH (ref 65–99)
Glucose-Capillary: 172 mg/dL — ABNORMAL HIGH (ref 65–99)

## 2016-07-06 MED ORDER — DULOXETINE HCL 60 MG PO CPEP: 60.0000 mg | ORAL_CAPSULE | Freq: Two times a day (BID) | ORAL | Status: DC

## 2016-07-06 MED ORDER — PROCHLORPERAZINE EDISYLATE 5 MG/ML IJ SOLN: 10.0000 mg | Freq: Four times a day (QID) | INTRAMUSCULAR | Status: DC | PRN

## 2016-07-06 MED ORDER — MAGNESIUM SULFATE 2 GM/50ML IV SOLN
2.0000 g | Freq: Once | INTRAVENOUS | Status: DC
  Filled 2016-07-06: qty 50

## 2016-07-06 MED ORDER — METOCLOPRAMIDE HCL 5 MG PO TABS
15.0000 mg | ORAL_TABLET | Freq: Three times a day (TID) | ORAL | Status: DC
  Administered 2016-07-06: 15 mg via ORAL
  Filled 2016-07-06: qty 1

## 2016-07-06 NOTE — Progress Notes (Signed)
PROGRESS NOTE    Deborah [name]  WLN:[id number] DOB: [birth year] DOA: 07/04/2016 PCP: Nolon Bussing, PA   Outpatient Specialists:     Brief Narrative:    Deborah [name] is a 51 y.o. female with a past medical history significant for IDDM poorly controlled, gastroparesis, HTN, hx of CVA who presents with 1 day epigastric pain.  The been admitted 5 times in the last 6 months for epigastric pain (here at [facility]-- has also been admitted at other facilities), abdominal pain, chest pain, and nausea and vomiting. She's had 2 negative CT angiograms of the chest, she had a Myoview last October that was low risk, she had negative abdominal x-rays, she had a gastric emptying study that was normal on Reglan, and she has been seen by GI diagnosed with gastroparesis, and discharged on Reglan and sucralfate. Since her last discharge, she has been back to her normal state of health, ambulatory, functional, but last night around midnight she developed recurrence of her constant, severe, central epigastric abdominal pain. This persisted through the day, did not resolve with Reglan at home and she came to the emergency room tonight because she did not have pain medicine at home.  She states sugars are in the 200s at home.  Assessment & Plan:   Principal Problem:   Intractable nausea and vomiting Active Problems:   Essential hypertension, benign   Uncontrolled type 2 diabetes mellitus with diabetic neuropathy, with long-term current use of insulin (HCC)   CKD (chronic kidney disease), stage II   Diabetic gastroparesis (HCC)   Gout    Intractable nausea and vomiting:  Due to gastroparesis -CT of the abdomen and pelvis with IV contrast: mild hepatic steatosis -Reglan schedule and PRN zofran/compazine -refused erythromycin trial -refusing tylenol as it makes her "ulcers worse" -increase cymbalta for pain control -Sucralfate -IV fluids with K -replace Mg -tolerating clears-- advance to  full liquids -No opiates as this will worsen gastroparesis (given fentanyl upon admission)  Insulin-dependent diabetes, poorly controlled:  On large doses 70/30 TID at home -resume low dose 70/30 -HgbA1C >9 -High dose SSI  Hypertension history of CVA:  Slightly hypertensive at admission -Continue metoprolol, amlodipine -Continue statin  History of gout:  Not active -Continue allopurinol  Anemia:  Stable  Mild non-gap acidosis:  Presumably from diarrhea. -Trend BMP  GERD -PPI     DVT prophylaxis:  Lovenox  Code Status: Full Code   Family Communication:   Disposition Plan:     Consultants:      Subjective: "get out of my face"   Objective: Vitals:   07/05/16 1713 07/05/16 2119 07/06/16 0538 07/06/16 1000  BP: 140/89 (!) 154/97 116/75 117/74  Pulse: (!) 121 (!) 114 (!) 110 (!) 104  Resp: 18 18 18 18   Temp: 98.2 F (36.8 C) 98.8 F (37.1 C) 98.3 F (36.8 C) 99.4 F (37.4 C)  TempSrc: Oral Oral Oral Oral  SpO2: 99% 95% 94% 96%    Intake/Output Summary (Last 24 hours) at 07/06/16 1320 Last data filed at 07/06/16 0900  Gross per 24 hour  Intake           3542.5 ml  Output             1100 ml  Net           2442.5 ml   There were no vitals filed for this visit.  Examination:  General exam: NAD Respiratory system: refused Cardiovascular system: refused Gastrointestinal system: refused Central nervous system: Alert-  no focal deficits Skin: No rashes, lesions or ulcers    Data Reviewed: I have personally reviewed following labs and imaging studies  CBC:  Recent Labs Lab 07/04/16 1337 07/05/16 0448  WBC 11.0* 10.1  HGB 10.8* 10.1*  HCT 33.6* 30.4*  MCV 86.4 85.4  PLT 400 619   Basic Metabolic Panel:  Recent Labs Lab 07/04/16 1337 07/05/16 0448 07/05/16 1430  NA 134* 136  --   K 3.5 3.6  --   CL 103 105  --   CO2 18* 20*  --   GLUCOSE 289* 332*  --   BUN 10 12  --   CREATININE 1.15* 1.10*  --   CALCIUM  8.8* 8.4*  --   MG  --  0.9* 1.6*   GFR: CrCl cannot be calculated (Unknown ideal weight.). Liver Function Tests:  Recent Labs Lab 07/04/16 1337 07/05/16 0448  AST 27 23  ALT 22 21  ALKPHOS 96 84  BILITOT 0.7 0.6  PROT 7.6 7.3  ALBUMIN 3.6 3.4*    Recent Labs Lab 07/04/16 1337  LIPASE 23   No results for input(s): AMMONIA in the last 168 hours. Coagulation Profile: No results for input(s): INR, PROTIME in the last 168 hours. Cardiac Enzymes: No results for input(s): CKTOTAL, CKMB, CKMBINDEX, TROPONINI in the last 168 hours. BNP (last 3 results) No results for input(s): PROBNP in the last 8760 hours. HbA1C:  Recent Labs  07/05/16 0448  HGBA1C 9.7*   CBG:  Recent Labs Lab 07/05/16 1146 07/05/16 1641 07/05/16 2117 07/06/16 0740 07/06/16 1148  GLUCAP 207* 197* 152* 149* 172*   Lipid Profile: No results for input(s): CHOL, HDL, LDLCALC, TRIG, CHOLHDL, LDLDIRECT in the last 72 hours. Thyroid Function Tests: No results for input(s): TSH, T4TOTAL, FREET4, T3FREE, THYROIDAB in the last 72 hours. Anemia Panel: No results for input(s): VITAMINB12, FOLATE, FERRITIN, TIBC, IRON, RETICCTPCT in the last 72 hours. Urine analysis:    Component Value Date/Time   COLORURINE YELLOW 07/06/2016 0746   APPEARANCEUR CLEAR 07/06/2016 0746   LABSPEC 1.017 07/06/2016 0746   PHURINE 5.0 07/06/2016 0746   GLUCOSEU 50 (A) 07/06/2016 0746   HGBUR SMALL (A) 07/06/2016 0746   BILIRUBINUR NEGATIVE 07/06/2016 [place] 07/06/2016 0746   PROTEINUR 100 (A) 07/06/2016 0746   UROBILINOGEN 0.2 10/02/2013 2108   NITRITE NEGATIVE 07/06/2016 0746   LEUKOCYTESUR NEGATIVE 07/06/2016 0746     )No results found for this or any previous visit (from the past 240 hour(s)).    Anti-infectives    None       Radiology Studies: Dg Chest 2 View  Result Date: 07/04/2016 CLINICAL DATA:  Chest pain EXAM: CHEST  2 VIEW COMPARISON:  None. FINDINGS: The heart size and  mediastinal contours are within normal limits. Both lungs are clear. The visualized skeletal structures are unremarkable. IMPRESSION: No active cardiopulmonary disease. Electronically Signed   By: Kathreen Devoid   On: 07/04/2016 14:21   Ct Abdomen Pelvis W Contrast  Result Date: 07/05/2016 CLINICAL DATA:  Abdominal pain tonight. EXAM: CT ABDOMEN AND PELVIS WITH CONTRAST TECHNIQUE: Multidetector CT imaging of the abdomen and pelvis was performed using the standard protocol following bolus administration of intravenous contrast. CONTRAST:  132mL ISOVUE-300 IOPAMIDOL (ISOVUE-300) INJECTION 61% COMPARISON:  Multiple prior CT examinations dating back to 02/20/2015 FINDINGS: Lower chest: No acute abnormality. Hepatobiliary: There is mild fatty infiltration of the liver without focal lesion. There is cholecystectomy. Bile ducts are normal. Pancreas: Unremarkable. No pancreatic ductal  dilatation or surrounding inflammatory changes. Spleen: Normal in size without focal abnormality. Adrenals/Urinary Tract: Adrenal glands are unremarkable. Kidneys are normal, without renal calculi, focal lesion, or hydronephrosis. Bladder is unremarkable. Stomach/Bowel: Stomach is within normal limits. Appendix appears normal. No evidence of bowel wall thickening, distention, or inflammatory changes. Vascular/Lymphatic: No significant vascular findings are present. No enlarged abdominal or pelvic lymph nodes. Reproductive: Unchanged 5 cm mass at the left lateral aspect of the uterus, probably an exophytic fibroid. Other: No focal inflammation.  No ascites. Musculoskeletal: No significant skeletal lesion. IMPRESSION: Mild hepatic steatosis. Unchanged 5 cm mass of the left lateral aspect of the uterus, most likely an exophytic fibroid. No focal inflammation. No ascites. Electronically Signed   By: Andreas Newport M.D.   On: 07/05/2016 00:22        Scheduled Meds: . allopurinol  100 mg Oral Daily  . amLODipine  10 mg Oral Daily  .  atorvastatin  20 mg Oral QHS  . DULoxetine  60 mg Oral BID  . enoxaparin (LOVENOX) injection  40 mg Subcutaneous Q24H  . hyoscyamine  0.25 mg Sublingual Once  . insulin aspart  0-20 Units Subcutaneous TID WC  . insulin aspart  0-5 Units Subcutaneous QHS  . insulin aspart protamine- aspart  40 Units Subcutaneous BID WC  . metoCLOPramide  15 mg Oral TID AC & HS  . metoprolol succinate  25 mg Oral Daily  . pantoprazole  40 mg Oral BID   Continuous Infusions: . 0.9 % NaCl with KCl 20 mEq / L 75 mL (07/06/16 0825)     LOS: 1 day    Time spent: 25 min-- majority of time spent counseling patient regarding the nature of gastroparesis and how opioids are not indicated    Prichard, DO [facility] Pager [phone]  If 7PM-7AM, please contact night-coverage [email or web address] Password [email or web address] 07/06/2016, 1:20 PM

## 2016-07-07 NOTE — Discharge Summary (Signed)
Patient left AMA  Please see last progress note from 07/06/16  Eulogio Bear DO

## 2016-07-09 NOTE — Progress Notes (Signed)
Patient verbalized that she wanted to leave AMA.Paged attending M.D.

## 2016-07-09 NOTE — Progress Notes (Signed)
Patient signed AMA Form.Called security for transport assist.

## 2016-08-13 ENCOUNTER — Emergency Department (HOSPITAL_COMMUNITY)
Admission: EM | Admit: 2016-08-13 | Discharge: 2016-08-13 | Disposition: A | Discharge status: Home / Self Care | Payer: Self-pay | Attending: Emergency Medicine | Admitting: Emergency Medicine

## 2016-08-13 ENCOUNTER — Encounter (HOSPITAL_COMMUNITY): Payer: Self-pay | Admitting: *Deleted

## 2016-08-13 DIAGNOSIS — K3184 Gastroparesis: Secondary | ICD-10-CM | POA: Insufficient documentation

## 2016-08-13 DIAGNOSIS — Z794 Long term (current) use of insulin: Secondary | ICD-10-CM | POA: Insufficient documentation

## 2016-08-13 DIAGNOSIS — E1140 Type 2 diabetes mellitus with diabetic neuropathy, unspecified: Secondary | ICD-10-CM | POA: Insufficient documentation

## 2016-08-13 DIAGNOSIS — I10 Essential (primary) hypertension: Secondary | ICD-10-CM | POA: Insufficient documentation

## 2016-08-13 DIAGNOSIS — R197 Diarrhea, unspecified: Secondary | ICD-10-CM | POA: Insufficient documentation

## 2016-08-13 LAB — CBC
HEMATOCRIT: 32.5 % — AB (ref 36.0–46.0)
HEMOGLOBIN: 10.9 g/dL — AB (ref 12.0–15.0)
MCH: 28.5 pg (ref 26.0–34.0)
MCHC: 33.5 g/dL (ref 30.0–36.0)
MCV: 84.9 fL (ref 78.0–100.0)
Platelets: 421 10*3/uL — ABNORMAL HIGH (ref 150–400)
RBC: 3.83 MIL/uL — ABNORMAL LOW (ref 3.87–5.11)
RDW: 14.2 % (ref 11.5–15.5)
WBC: 12.2 10*3/uL — ABNORMAL HIGH (ref 4.0–10.5)

## 2016-08-13 LAB — COMPREHENSIVE METABOLIC PANEL
ALK PHOS: 109 U/L (ref 38–126)
ALT: 24 U/L (ref 14–54)
AST: 20 U/L (ref 15–41)
Albumin: 3.7 g/dL (ref 3.5–5.0)
Anion gap: 8 (ref 5–15)
BILIRUBIN TOTAL: 0.8 mg/dL (ref 0.3–1.2)
BUN: 23 mg/dL — AB (ref 6–20)
CO2: 17 mmol/L — AB (ref 22–32)
Calcium: 9.1 mg/dL (ref 8.9–10.3)
Chloride: 112 mmol/L — ABNORMAL HIGH (ref 101–111)
Creatinine, Ser: 1.41 mg/dL — ABNORMAL HIGH (ref 0.44–1.00)
GFR calc Af Amer: 49 mL/min — ABNORMAL LOW (ref >60)
GFR calc non Af Amer: 42 mL/min — ABNORMAL LOW (ref >60)
GLUCOSE: 147 mg/dL — AB (ref 65–99)
POTASSIUM: 3.3 mmol/L — AB (ref 3.5–5.1)
Sodium: 137 mmol/L (ref 135–145)
TOTAL PROTEIN: 8.4 g/dL — AB (ref 6.5–8.1)

## 2016-08-13 LAB — LIPASE, BLOOD: LIPASE: 33 U/L (ref 11–51)

## 2016-08-13 MED ORDER — ONDANSETRON 4 MG PO TBDP
4.0000 mg | ORAL_TABLET | Freq: Once | ORAL | Status: AC | PRN
  Administered 2016-08-13: 4 mg via ORAL
  Filled 2016-08-13: qty 1

## 2016-08-13 MED ORDER — SODIUM CHLORIDE 0.9 % IV BOLUS (SEPSIS)
1000.0000 mL | Freq: Once | INTRAVENOUS | Status: AC
  Administered 2016-08-13: 1000 mL via INTRAVENOUS

## 2016-08-13 MED ORDER — POTASSIUM CHLORIDE ER 10 MEQ PO TBCR
20.0000 meq | EXTENDED_RELEASE_TABLET | Freq: Every day | ORAL | 0 refills | Status: DC
Start: 2016-08-13 — End: 2017-03-22

## 2016-08-13 MED ORDER — MORPHINE SULFATE (PF) 2 MG/ML IV SOLN
4.0000 mg | Freq: Once | INTRAVENOUS | Status: AC
  Administered 2016-08-13: 4 mg via INTRAVENOUS
  Filled 2016-08-13: qty 2

## 2016-08-13 MED ORDER — ONDANSETRON 4 MG PO TBDP: 4.0000 mg | ORAL_TABLET | Freq: Three times a day (TID) | ORAL | 0 refills | Status: DC | PRN

## 2016-08-13 MED ORDER — ONDANSETRON HCL 4 MG/2ML IJ SOLN
4.0000 mg | Freq: Once | INTRAMUSCULAR | Status: AC
  Administered 2016-08-13: 4 mg via INTRAVENOUS
  Filled 2016-08-13: qty 2

## 2016-08-13 NOTE — ED Triage Notes (Signed)
Pt complains of abdominal pain, n/v/d for the past 2 days. Pt has hx of gastroparesis and feels she is having flare up.

## 2016-08-13 NOTE — ED Notes (Signed)
Pt told me she wanted me to shred her d/c papers in front of her, so I put the papers in the shred-it box

## 2016-08-13 NOTE — ED Notes (Signed)
Pt aware of need for urine specimen. 

## 2016-08-13 NOTE — ED Provider Notes (Signed)
[facility] DEPT Provider Note   [name]: [id number] Arrival date & time: 08/13/16  1542     History   Chief Complaint Chief Complaint  Patient presents with  . Abdominal Pain  . Emesis  . Diarrhea    HPI Deborah [name] is a 51 y.o. female.   Abdominal Pain   This is a recurrent problem. The current episode started 2 days ago. The problem occurs daily. The problem has been gradually worsening. The pain is associated with an unknown factor. The pain is located in the epigastric region and LUQ. The pain is moderate. Associated symptoms include anorexia, diarrhea, nausea and vomiting. Pertinent negatives include fever, hematochezia, melena and constipation. The symptoms are aggravated by eating. Nothing relieves the symptoms. Past medical history comments: Gastroparesis.  Emesis   Associated symptoms include abdominal pain and diarrhea. Pertinent negatives include no fever.  Diarrhea   Associated symptoms include abdominal pain and vomiting. Past medical history comments: Gastroparesis.   This is typical for her gastroparesis.   Past Medical History:  Diagnosis Date  . Chest pain 12/2015  . Diabetes mellitus   . Fibromyalgia   . Gastric ulcer   . Gastroparesis   . Gout   . Hyperlipidemia   . Hypertension   . Obesity   . Pneumonia   . Stroke [facility]) 02/2011    Patient Active Problem List   Diagnosis Date Noted  . Intractable nausea and vomiting 06/17/2016  . Diabetic gastroparesis ([place]) 06/05/2016  . Gout 06/05/2016  . AKI (acute kidney injury) ([place]) 12/06/2015  . Hypokalemia 09/26/2015  . Nausea and vomiting 08/20/2015  . Gout flare 05/27/2015  . Abdominal pain 05/26/2015  . DKA (diabetic ketoacidoses) ([place]) 05/25/2015  . Uncontrolled type 2 diabetes mellitus with diabetic neuropathy, with long-term current use of insulin ([place]) 05/25/2015  . Dyslipidemia associated with type 2 diabetes mellitus ([place]) 05/25/2015  . CKD (chronic kidney disease), stage II  05/25/2015  . Essential hypertension, benign 09/28/2013    Past Surgical History:  Procedure Laterality Date  . CATARACT EXTRACTION  01/2014  . CHOLECYSTECTOMY      OB History    No data available       Home Medications    Prior to Admission medications   Medication Sig Start Date End Date Taking? Authorizing Provider  albuterol (PROVENTIL HFA;VENTOLIN HFA) 108 (90 Base) MCG/ACT inhaler Inhale 2 puffs into the lungs every 6 (six) hours as needed for wheezing or shortness of breath. 09/23/15   Debbe Odea, MD  allopurinol (ZYLOPRIM) 100 MG tablet Take 1 tablet (100 mg total) by mouth daily. 09/23/15   Debbe Odea, MD  amLODipine (NORVASC) 10 MG tablet Take 1 tablet (10 mg total) by mouth daily. 06/08/16   Nita Sells, MD  atorvastatin (LIPITOR) 20 MG tablet Take 20 mg by mouth at bedtime.    [provider]  DULoxetine (CYMBALTA) 30 MG capsule Take 1 capsule (30 mg total) by mouth 2 (two) times daily. 09/23/15   Debbe Odea, MD  metoCLOPramide (REGLAN) 10 MG tablet Take 1 tablet (10 mg total) by mouth 3 (three) times daily before meals. 06/08/16   Nita Sells, MD  metoprolol succinate (TOPROL-XL) 25 MG 24 hr tablet Take 1 tablet (25 mg total) by mouth daily. 06/08/16   Nita Sells, MD  nitroGLYCERIN (NITROSTAT) 0.4 MG SL tablet Place 1 tablet (0.4 mg total) under the tongue every 5 (five) minutes as needed for chest pain. 12/20/15   [name], [name], MD  [place]  70/30 (70-30) 100 UNIT/ML injection 75 units in am, 60 units  lunch, and 50 units pm Patient taking differently: Inject 50-75 Units into the skin See admin instructions. Inject 75 unit subcutaneously with breakfast, 60 units with lunch and 50 units with supper 06/08/16   Nita Sells, MD  ondansetron (ZOFRAN ODT) 4 MG disintegrating tablet Take 1 tablet (4 mg total) by mouth every 8 (eight) hours as needed for nausea or vomiting. Patient not taking: Reported on 07/04/2016 06/08/16    Nita Sells, MD  pantoprazole (PROTONIX) 40 MG tablet Take 1 tablet (40 mg total) by mouth 2 (two) times daily. 06/08/16   Nita Sells, MD  sucralfate (CARAFATE) 1 g tablet Take 1 tablet (1 g total) by mouth 4 (four) times daily -  with meals and at bedtime. 06/08/16   Nita Sells, MD    Family History Family History  Problem Relation Age of Onset  . Diabetes Mother   . Diabetes Father   . Heart disease Father   . Diabetes Sister   . Congestive Heart Failure Sister 63  . Diabetes Brother     Social History Social History  Substance Use Topics  . Smoking status: Never Smoker  . Smokeless tobacco: Never Used  . Alcohol use No     Allergies   Lisinopril; Penicillins; Valium [diazepam]; Aspirin; Food; Nsaids; Tylenol [acetaminophen]; Morphine and related; and Tramadol   Review of Systems Review of Systems  Constitutional: Negative for fever.  Gastrointestinal: Positive for abdominal pain, anorexia, diarrhea, nausea and vomiting. Negative for constipation, hematochezia and melena.     Physical Exam Updated Vital Signs BP 108/81 (BP Location: Right Arm)   Pulse (!) 124   Temp 98.1 F (36.7 C) (Oral)   Resp 16   LMP 10/10/2012   SpO2 98%   Physical Exam  Constitutional: She is oriented to person, place, and time. She appears well-developed and well-nourished. No distress.  HENT:  Head: Normocephalic and atraumatic.  Nose: Nose normal.  Eyes: Conjunctivae and EOM are normal. Pupils are equal, round, and reactive to light. Right eye exhibits no discharge. Left eye exhibits no discharge. No scleral icterus.  Neck: Normal range of motion. Neck supple.  Cardiovascular: Normal rate and regular rhythm.  Exam reveals no gallop and no friction rub.   No murmur heard. Pulmonary/Chest: Effort normal and breath sounds normal. No stridor. No respiratory distress. She has no rales.  Abdominal: Soft. She exhibits no distension. There is generalized tenderness  (discomfort). There is no rigidity, no rebound and no guarding.  Musculoskeletal: She exhibits no edema or tenderness.  Neurological: She is alert and oriented to person, place, and time.  Circular motion of the tongue  Skin: Skin is warm and dry. No rash noted. She is not diaphoretic. No erythema.  Psychiatric: She has a normal mood and affect.  Vitals reviewed.    ED Treatments / Results  Labs (all labs ordered are listed, but only abnormal results are displayed) Labs Reviewed  COMPREHENSIVE METABOLIC PANEL - Abnormal; Notable for the following:       Result Value   Potassium 3.3 (*)    Chloride 112 (*)    CO2 17 (*)    Glucose, Bld 147 (*)    BUN 23 (*)    Creatinine, Ser 1.41 (*)    Total Protein 8.4 (*)    GFR calc non Af Amer 42 (*)    GFR calc Af Amer 49 (*)    All other components within  normal limits  CBC - Abnormal; Notable for the following:    WBC 12.2 (*)    RBC 3.83 (*)    Hemoglobin 10.9 (*)    HCT 32.5 (*)    Platelets 421 (*)    All other components within normal limits  LIPASE, BLOOD  URINALYSIS, ROUTINE W REFLEX MICROSCOPIC    EKG  EKG Interpretation None       Radiology No results found.  Procedures Procedures (including critical care time)  Medications Ordered in ED Medications  ondansetron (ZOFRAN-ODT) disintegrating tablet 4 mg (4 mg Oral Given 08/13/16 1608)     Initial Impression / Assessment and Plan / ED Course  I have reviewed the triage vital signs and the nursing notes.  Pertinent labs & imaging results that were available during my care of the patient were reviewed by me and considered in my medical decision making (see chart for details).      1. Nausea, vomiting, diarrhea The patient this is consistent with her gastroparesis flare. Abdomen diffusely discomfort however no evidence of peritonitis. Labs did reveal evidence of dehydration. Provided with Zofran and IV fluid. No emesis or diarrhea during her multiple hour stay  in the emergency department. Give the patient by mouth fluids and was able to tolerate. Patient was noted to be resting comfortably in bed.`   2. Oral movements States it's been on going for 1 month. Currently on Reglan and previously given Haldol (4/29). Possible tardive dyskinesia  Hold other possible triggering medications.  Patient's oral movements seized when patient was sleeping comfortably. They did not return after she awoke from sleep. Unlikely tardive dyskinesia. Likely psychogenic.  The patient is safe for discharge with strict return precautions.  Final Clinical Impressions(s) / ED Diagnoses   Final diagnoses:  None   Disposition: Discharge  Condition: Good  I have discussed the results, Dx and Tx plan with the patient who expressed understanding and agree(s) with the plan. Discharge instructions discussed at great length. The patient was given strict return precautions who verbalized understanding of the instructions. No further questions at time of discharge.    New Prescriptions   ONDANSETRON (ZOFRAN ODT) 4 MG DISINTEGRATING TABLET    Take 1 tablet (4 mg total) by mouth every 8 (eight) hours as needed for nausea or vomiting.   POTASSIUM CHLORIDE (K-DUR) 10 MEQ TABLET    Take 2 tablets (20 mEq total) by mouth daily.    Follow Up: Nolon Bussing, [place] [address] [phone]  Schedule an appointment as soon as possible for a visit  As needed      [name], MD 08/13/16 2046

## 2016-08-13 NOTE — ED Notes (Signed)
Pt. told  Tech to throw her discharge papers with prescriptions into shedder because she will be back tomorrow.

## 2016-08-13 NOTE — ED Notes (Signed)
Pt.refused the fluid /food offered by this Nurse, stated that she is not thirsty nor hungry .pt. Reminded to collect urine for U/A, stated that she don't have to use th bathroom if she don't feel like doing at this time.

## 2016-08-15 ENCOUNTER — Encounter (HOSPITAL_COMMUNITY): Payer: Self-pay

## 2016-08-15 ENCOUNTER — Inpatient Hospital Stay (HOSPITAL_COMMUNITY)
Admission: EM | Admit: 2016-08-15 | Discharge: 2016-08-23 | DRG: 074 | Disposition: A | Discharge status: Home / Self Care | Payer: Self-pay | Attending: Internal Medicine | Admitting: Internal Medicine

## 2016-08-15 ENCOUNTER — Emergency Department (HOSPITAL_COMMUNITY): Payer: Self-pay

## 2016-08-15 DIAGNOSIS — Z880 Allergy status to penicillin: Secondary | ICD-10-CM

## 2016-08-15 DIAGNOSIS — D649 Anemia, unspecified: Secondary | ICD-10-CM | POA: Diagnosis present

## 2016-08-15 DIAGNOSIS — N184 Chronic kidney disease, stage 4 (severe): Secondary | ICD-10-CM | POA: Diagnosis present

## 2016-08-15 DIAGNOSIS — Z8673 Personal history of transient ischemic attack (TIA), and cerebral infarction without residual deficits: Secondary | ICD-10-CM

## 2016-08-15 DIAGNOSIS — R05 Cough: Secondary | ICD-10-CM

## 2016-08-15 DIAGNOSIS — K3184 Gastroparesis: Secondary | ICD-10-CM | POA: Diagnosis present

## 2016-08-15 DIAGNOSIS — E1140 Type 2 diabetes mellitus with diabetic neuropathy, unspecified: Secondary | ICD-10-CM

## 2016-08-15 DIAGNOSIS — R112 Nausea with vomiting, unspecified: Secondary | ICD-10-CM | POA: Diagnosis present

## 2016-08-15 DIAGNOSIS — Z888 Allergy status to other drugs, medicaments and biological substances status: Secondary | ICD-10-CM

## 2016-08-15 DIAGNOSIS — E1165 Type 2 diabetes mellitus with hyperglycemia: Secondary | ICD-10-CM | POA: Diagnosis present

## 2016-08-15 DIAGNOSIS — Z886 Allergy status to analgesic agent status: Secondary | ICD-10-CM

## 2016-08-15 DIAGNOSIS — I129 Hypertensive chronic kidney disease with stage 1 through stage 4 chronic kidney disease, or unspecified chronic kidney disease: Secondary | ICD-10-CM | POA: Diagnosis present

## 2016-08-15 DIAGNOSIS — N182 Chronic kidney disease, stage 2 (mild): Secondary | ICD-10-CM | POA: Diagnosis present

## 2016-08-15 DIAGNOSIS — R1084 Generalized abdominal pain: Secondary | ICD-10-CM

## 2016-08-15 DIAGNOSIS — M109 Gout, unspecified: Secondary | ICD-10-CM | POA: Diagnosis present

## 2016-08-15 DIAGNOSIS — E1122 Type 2 diabetes mellitus with diabetic chronic kidney disease: Secondary | ICD-10-CM | POA: Diagnosis present

## 2016-08-15 DIAGNOSIS — E1143 Type 2 diabetes mellitus with diabetic autonomic (poly)neuropathy: Principal | ICD-10-CM | POA: Diagnosis present

## 2016-08-15 DIAGNOSIS — N390 Urinary tract infection, site not specified: Secondary | ICD-10-CM | POA: Diagnosis present

## 2016-08-15 DIAGNOSIS — Z6841 Body Mass Index (BMI) 40.0 and over, adult: Secondary | ICD-10-CM

## 2016-08-15 DIAGNOSIS — Z91018 Allergy to other foods: Secondary | ICD-10-CM

## 2016-08-15 DIAGNOSIS — I10 Essential (primary) hypertension: Secondary | ICD-10-CM | POA: Diagnosis present

## 2016-08-15 DIAGNOSIS — F329 Major depressive disorder, single episode, unspecified: Secondary | ICD-10-CM | POA: Diagnosis present

## 2016-08-15 DIAGNOSIS — Z794 Long term (current) use of insulin: Secondary | ICD-10-CM

## 2016-08-15 DIAGNOSIS — N1832 Chronic kidney disease, stage 3b: Secondary | ICD-10-CM | POA: Diagnosis present

## 2016-08-15 DIAGNOSIS — IMO0002 Reserved for concepts with insufficient information to code with codable children: Secondary | ICD-10-CM

## 2016-08-15 DIAGNOSIS — R059 Cough, unspecified: Secondary | ICD-10-CM

## 2016-08-15 DIAGNOSIS — E876 Hypokalemia: Secondary | ICD-10-CM | POA: Diagnosis present

## 2016-08-15 DIAGNOSIS — M797 Fibromyalgia: Secondary | ICD-10-CM | POA: Diagnosis present

## 2016-08-15 DIAGNOSIS — E785 Hyperlipidemia, unspecified: Secondary | ICD-10-CM | POA: Diagnosis present

## 2016-08-15 LAB — COMPREHENSIVE METABOLIC PANEL
ALK PHOS: 109 U/L (ref 38–126)
ALT: 19 U/L (ref 14–54)
AST: 21 U/L (ref 15–41)
Albumin: 3.6 g/dL (ref 3.5–5.0)
Anion gap: 10 (ref 5–15)
BUN: 25 mg/dL — AB (ref 6–20)
CALCIUM: 9.1 mg/dL (ref 8.9–10.3)
CHLORIDE: 109 mmol/L (ref 101–111)
CO2: 17 mmol/L — ABNORMAL LOW (ref 22–32)
CREATININE: 1.42 mg/dL — AB (ref 0.44–1.00)
GFR calc Af Amer: 48 mL/min — ABNORMAL LOW (ref >60)
GFR, EST NON AFRICAN AMERICAN: 42 mL/min — AB (ref >60)
Glucose, Bld: 305 mg/dL — ABNORMAL HIGH (ref 65–99)
Potassium: 3.2 mmol/L — ABNORMAL LOW (ref 3.5–5.1)
Sodium: 136 mmol/L (ref 135–145)
Total Bilirubin: 0.5 mg/dL (ref 0.3–1.2)
Total Protein: 7.8 g/dL (ref 6.5–8.1)

## 2016-08-15 LAB — CBC
HCT: 31.6 % — ABNORMAL LOW (ref 36.0–46.0)
Hemoglobin: 10.4 g/dL — ABNORMAL LOW (ref 12.0–15.0)
MCH: 28.1 pg (ref 26.0–34.0)
MCHC: 32.9 g/dL (ref 30.0–36.0)
MCV: 85.4 fL (ref 78.0–100.0)
PLATELETS: 406 10*3/uL — AB (ref 150–400)
RBC: 3.7 MIL/uL — ABNORMAL LOW (ref 3.87–5.11)
RDW: 14.4 % (ref 11.5–15.5)
WBC: 10.7 10*3/uL — AB (ref 4.0–10.5)

## 2016-08-15 LAB — LIPASE, BLOOD: LIPASE: 30 U/L (ref 11–51)

## 2016-08-15 LAB — BRAIN NATRIURETIC PEPTIDE: B Natriuretic Peptide: 25.4 pg/mL (ref 0.0–100.0)

## 2016-08-15 LAB — D-DIMER, QUANTITATIVE: D-Dimer, Quant: 0.36 ug/mL-FEU (ref 0.00–0.50)

## 2016-08-15 LAB — I-STAT CG4 LACTIC ACID, ED: Lactic Acid, Venous: 2.23 mmol/L (ref 0.5–1.9)

## 2016-08-15 MED ORDER — GI COCKTAIL ~~LOC~~
30.0000 mL | Freq: Once | ORAL | Status: AC
  Administered 2016-08-15: 30 mL via ORAL
  Filled 2016-08-15: qty 30

## 2016-08-15 MED ORDER — MORPHINE SULFATE (PF) 2 MG/ML IV SOLN
4.0000 mg | Freq: Once | INTRAVENOUS | Status: AC
  Administered 2016-08-15: 4 mg via INTRAVENOUS
  Filled 2016-08-15: qty 2

## 2016-08-15 MED ORDER — ONDANSETRON HCL 4 MG/2ML IJ SOLN
4.0000 mg | Freq: Once | INTRAMUSCULAR | Status: AC
  Administered 2016-08-15: 4 mg via INTRAVENOUS
  Filled 2016-08-15: qty 2

## 2016-08-15 NOTE — ED Triage Notes (Signed)
States abdominal pain since Tuesday and seen here on Friday for same and states today with extreme 10/10 pain with nausea and vomiting and when abdominal pain gets bad she has a hard time breathing.

## 2016-08-15 NOTE — ED Provider Notes (Signed)
[facility] DEPT Provider Note   [name]: [id number] Arrival date & time: 08/15/16  1910     History   Chief Complaint Chief Complaint  Patient presents with  . Abdominal Pain  . [name]    HPI Deborah [name] is a 51 y.o. female.  The history is provided by the patient and medical records.  Emesis   This is a recurrent problem. The current episode started more than 2 days ago. The problem occurs continuously. The problem has not changed since onset.The emesis has an appearance of stomach contents. There has been no fever. Associated symptoms include abdominal pain. Pertinent negatives include no chills, no cough, no diarrhea, no fever, no headaches and no URI.    Past Medical History:  Diagnosis Date  . Chest pain 12/2015  . Diabetes mellitus   . Fibromyalgia   . Gastric ulcer   . Gastroparesis   . Gout   . Hyperlipidemia   . Hypertension   . Obesity   . Pneumonia   . Stroke [facility]) 02/2011    Patient Active Problem List   Diagnosis Date Noted  . Intractable nausea and vomiting 06/17/2016  . Diabetic gastroparesis ([place]) 06/05/2016  . Gout 06/05/2016  . AKI (acute kidney injury) ([place]) 12/06/2015  . Hypokalemia 09/26/2015  . Nausea and vomiting 08/20/2015  . Gout flare 05/27/2015  . Abdominal pain 05/26/2015  . DKA (diabetic ketoacidoses) ([place]) 05/25/2015  . Uncontrolled type 2 diabetes mellitus with diabetic neuropathy, with long-term current use of insulin ([place]) 05/25/2015  . Dyslipidemia associated with type 2 diabetes mellitus ([place]) 05/25/2015  . CKD (chronic kidney disease), stage II 05/25/2015  . Essential hypertension, benign 09/28/2013    Past Surgical History:  Procedure Laterality Date  . CATARACT EXTRACTION  01/2014  . CHOLECYSTECTOMY      OB History    No data available       Home Medications    Prior to Admission medications   Medication Sig Start Date End Date Taking? Authorizing Provider  albuterol (PROVENTIL  HFA;VENTOLIN HFA) 108 (90 Base) MCG/ACT inhaler Inhale 2 puffs into the lungs every 6 (six) hours as needed for wheezing or shortness of breath. 09/23/15   Debbe Odea, MD  allopurinol (ZYLOPRIM) 100 MG tablet Take 1 tablet (100 mg total) by mouth daily. 09/23/15   Debbe Odea, MD  amLODipine (NORVASC) 10 MG tablet Take 1 tablet (10 mg total) by mouth daily. 06/08/16   Nita Sells, MD  atorvastatin (LIPITOR) 20 MG tablet Take 20 mg by mouth at bedtime.    [provider]  DULoxetine (CYMBALTA) 30 MG capsule Take 1 capsule (30 mg total) by mouth 2 (two) times daily. 09/23/15   Debbe Odea, MD  metoCLOPramide (REGLAN) 10 MG tablet Take 1 tablet (10 mg total) by mouth 3 (three) times daily before meals. 06/08/16   Nita Sells, MD  metoprolol succinate (TOPROL-XL) 25 MG 24 hr tablet Take 1 tablet (25 mg total) by mouth daily. 06/08/16   Nita Sells, MD  nitroGLYCERIN (NITROSTAT) 0.4 MG SL tablet Place 1 tablet (0.4 mg total) under the tongue every 5 (five) minutes as needed for chest pain. 12/20/15   [name], [name], MD  NOVOLOG MIX 70/30 (70-30) 100 UNIT/ML injection 75 units in am, 60 units  lunch, and 50 units pm Patient taking differently: Inject 50-75 Units into the skin See admin instructions. Inject 75 unit subcutaneously with breakfast, 60 units with lunch and 50 units with supper 06/08/16   [name],  [name], MD  ondansetron (ZOFRAN ODT) 4 MG disintegrating tablet Take 1 tablet (4 mg total) by mouth every 8 (eight) hours as needed for nausea or vomiting. 08/13/16 08/16/16  [name], MD  pantoprazole (PROTONIX) 40 MG tablet Take 1 tablet (40 mg total) by mouth 2 (two) times daily. 06/08/16   Nita Sells, MD  potassium chloride (K-DUR) 10 MEQ tablet Take 2 tablets (20 mEq total) by mouth daily. 08/13/16 08/20/16  [name], MD  sucralfate (CARAFATE) 1 g tablet Take 1 tablet (1 g total) by mouth 4 (four) times daily -  with meals  and at bedtime. 06/08/16   Nita Sells, MD    Family History Family History  Problem Relation Age of Onset  . Diabetes Mother   . Diabetes Father   . Heart disease Father   . Diabetes Sister   . Congestive Heart Failure Sister 26  . Diabetes Brother     Social History Social History  Substance Use Topics  . Smoking status: Never Smoker  . Smokeless tobacco: Never Used  . Alcohol use No     Allergies   Lisinopril; Penicillins; Valium [diazepam]; Aspirin; Food; Nsaids; Tylenol [acetaminophen]; and Tramadol   Review of Systems Review of Systems  Constitutional: Positive for fatigue. Negative for chills, diaphoresis and fever.  HENT: Negative for congestion.   Respiratory: Negative for cough, chest tightness, shortness of breath and wheezing.   Cardiovascular: Negative for chest pain, palpitations and leg swelling.  Gastrointestinal: Positive for abdominal pain, nausea and vomiting. Negative for diarrhea.  Genitourinary: Negative for dysuria, flank pain and frequency.  Musculoskeletal: Negative for back pain, neck pain and neck stiffness.  Neurological: Positive for light-headedness. Negative for headaches.  Psychiatric/Behavioral: Negative for agitation.  All other systems reviewed and are negative.    Physical Exam Updated Vital Signs BP 107/85 (BP Location: Right Arm)   Pulse (!) 117   Temp 97.4 F (36.3 C) (Oral)   Resp 18   Ht 5\' 6"  (1.676 m)   Wt 128.8 kg (284 lb)   LMP 10/10/2012   SpO2 98%   BMI 45.84 kg/m   Physical Exam  Constitutional: She is oriented to person, place, and time. She appears well-developed and well-nourished. No distress.  HENT:  Head: Normocephalic and atraumatic.  Mouth/Throat: Oropharynx is clear and moist. No oropharyngeal exudate.  Eyes: Conjunctivae and EOM are normal. Pupils are equal, round, and reactive to light.  Neck: Normal range of motion. Neck supple.  Cardiovascular: Regular rhythm.   No murmur  heard. Pulmonary/Chest: Effort normal and breath sounds normal. No stridor. Tachypnea noted. No respiratory distress. She has no wheezes. She exhibits no tenderness.  Abdominal: Soft. There is tenderness. There is no guarding.  Musculoskeletal: She exhibits no edema or tenderness.  Neurological: She is alert and oriented to person, place, and time. She is not disoriented. No sensory deficit. She exhibits normal muscle tone.  Skin: Skin is warm and dry. Capillary refill takes less than 2 seconds. No rash noted. No erythema.  Psychiatric: She has a normal mood and affect.  Nursing note and vitals reviewed.    ED Treatments / Results  Labs (all labs ordered are listed, but only abnormal results are displayed) Labs Reviewed  COMPREHENSIVE METABOLIC PANEL - Abnormal; Notable for the following:       Result Value   Potassium 3.2 (*)    CO2 17 (*)    Glucose, Bld 305 (*)    BUN 25 (*)  Creatinine, Ser 1.42 (*)    GFR calc non Af Amer 42 (*)    GFR calc Af Amer 48 (*)    All other components within normal limits  CBC - Abnormal; Notable for the following:    WBC 10.7 (*)    RBC 3.70 (*)    Hemoglobin 10.4 (*)    HCT 31.6 (*)    Platelets 406 (*)    All other components within normal limits  I-STAT CG4 LACTIC ACID, ED - Abnormal; Notable for the following:    Lactic Acid, Venous 2.23 (*)    All other components within normal limits  LIPASE, BLOOD  D-DIMER, QUANTITATIVE (NOT AT [facility])  BRAIN NATRIURETIC PEPTIDE  URINALYSIS, ROUTINE W REFLEX MICROSCOPIC  I-STAT CG4 LACTIC ACID, ED  POC URINE PREG, ED    EKG  EKG Interpretation  Date/Time:  Sunday August 15 2016 19:22:26 EDT Ventricular Rate:  118 PR Interval:    QRS Duration: 78 QT Interval:  324 QTC Calculation: 454 R Axis:   -35 Text Interpretation:  Sinus tachycardia Left axis deviation Abnormal R-wave progression, late transition When compared to prior, no significant changes.  No STEMI Confirmed by Antony Blackbird [phone])  on 08/15/2016 9:57:24 PM       Radiology Dg Chest 2 View  Result Date: 08/15/2016 CLINICAL DATA:  Acute onset of nausea and vomiting. Mid chest pain and upper abdominal pain. [name]. Initial encounter. EXAM: CHEST  2 VIEW COMPARISON:  Chest radiograph performed 06/17/2016 FINDINGS: The lungs are well-aerated and clear. There is no evidence of focal opacification, pleural effusion or pneumothorax. The heart is normal in size; the mediastinal contour is within normal limits. No acute osseous abnormalities are seen. Clips are noted within the right upper quadrant, reflecting prior cholecystectomy. IMPRESSION: No acute cardiopulmonary process seen. Electronically Signed   By: Garald Balding M.D.   On: 08/15/2016 22:46    Procedures Procedures (including critical care time)  Medications Ordered in ED Medications  gi cocktail (Maalox,Lidocaine,Donnatal) (30 mLs Oral Given 08/15/16 2229)  ondansetron (ZOFRAN) injection 4 mg (4 mg Intravenous Given 08/15/16 2229)  morphine 2 MG/ML injection 4 mg (4 mg Intravenous Given 08/15/16 2229)  sodium chloride 0.9 % bolus 1,000 mL (1,000 mLs Intravenous New Bag/Given 08/16/16 0046)  sodium chloride 0.9 % bolus 1,000 mL (1,000 mLs Intravenous New Bag/Given 08/16/16 0049)  morphine 2 MG/ML injection 4 mg (4 mg Intravenous Given 08/16/16 0050)  metoCLOPramide (REGLAN) injection 10 mg (10 mg Intravenous Given 08/16/16 0046)     Initial Impression / Assessment and Plan / ED Course  I have reviewed the triage vital signs and the nursing notes.  Pertinent labs & imaging results that were available during my care of the patient were reviewed by me and considered in my medical decision making (see chart for details).     Deborah [name] is a 51 y.o. female with a past medical history significant for stroke, hyperlipidemia, gastric ulcer, gastroparesis, diabetes with history of DKA, hypertension, and chronic kidney disease who presents with  intolerance of by mouth, nausea, vomiting, abdominal pain, shortness of breath, fatigue, and lightheadedness. Patient reports that she was seen at this facility several days ago and after she was given medications to help her symptoms, she was felt stable for discharge. She says that after leaving, she had return of her severe nausea, vomiting, and pain. She says that since discharge, she hasn't "not kept anything down" and has had no oral intake with food  or fluids. She says that she is feeling very dehydrated and she says that she is feeling lightheaded and very fatigued. She reports numerous episodes of nausea and vomiting and her abdominal pain is diffuse and 10 out of 10 in severity. She denies recent abdominal trauma. She denies any fevers, chills, chest pain. She does report some shortness of breath when she is vomiting. She denies significant cough or congestion. She says that she feels that her heart has been running fast due to her dehydration. She denies any dysuria or change in urination. She denies other symptoms.  History and exam are seen above. On arrival, patient is tachycardic. Patient's abdomen is diffusely tender to palpation. Patient's lungs are clear and chest is nontender. No significant large cavity edema on exam. No CVA tenderness.  Based on complaints, suspect gastroparesis flare causing her nausea, vomiting, and abdominal discomfort. Given her report of no oral intake, suspect dehydration. Patient will have workup to look for electrolyte abnormalities and dehydration. Patient was given pain medicine, nausea medicine, and fluids during initial workup. Discussion held about imaging and patient says that the contrast has hurt her kidneys in the past and she would rather not have imaging with a CT scan today.      Patient's laboratory testing showed mild hypokalemia. Creatinine slightly elevated from prior. Lactic acid elevated at 2.2. Mild leukocytosis and similar anemia to prior. BNP  nonelevated and negative d-dimer.  Patient was reassessed after initial dose of pain medicine, nausea medicine, and fluids. Patient continued to feel bad. Patient still tachycardic in the 1 teens. Patient was given more fluids, Reglan, and pain medicine. Anticipate reassessment after medications to determine disposition. Patient's heart rate has improved, and she tolerates by mouth, patient will likely be safe for discharge.    Final Clinical Impressions(s) / ED Diagnoses   Final diagnoses:  Generalized abdominal pain  Intractable vomiting with nausea, unspecified vomiting type    Clinical Impression: 1. Generalized abdominal pain   2. Intractable vomiting with nausea, unspecified vomiting type     Disposition: Care transferred to Dr. Regenia Skeeter to Await reassessment after more pain medicine, nausea medicine, and fluids to see if patient is rehydrated and has improvement in tachycardia.     Tegeler, Gwenyth Allegra, MD 08/16/16 [phone]

## 2016-08-15 NOTE — ED Notes (Addendum)
Pt with hx of gastroparesis c/o 10/10 lower abd pain, n/v x6, denies diarrhea, SOB. Pt seen a few days ago for same condition, states pain worsened. No medications for pain management today.

## 2016-08-15 NOTE — ED Notes (Signed)
Patient transported to X-ray 

## 2016-08-15 NOTE — ED Notes (Signed)
Patient made aware urine sample is needed. 

## 2016-08-16 ENCOUNTER — Encounter (HOSPITAL_COMMUNITY): Payer: Self-pay | Admitting: Internal Medicine

## 2016-08-16 DIAGNOSIS — N182 Chronic kidney disease, stage 2 (mild): Secondary | ICD-10-CM

## 2016-08-16 DIAGNOSIS — D649 Anemia, unspecified: Secondary | ICD-10-CM | POA: Diagnosis present

## 2016-08-16 DIAGNOSIS — R112 Nausea with vomiting, unspecified: Secondary | ICD-10-CM

## 2016-08-16 DIAGNOSIS — E1143 Type 2 diabetes mellitus with diabetic autonomic (poly)neuropathy: Secondary | ICD-10-CM | POA: Diagnosis present

## 2016-08-16 DIAGNOSIS — E876 Hypokalemia: Secondary | ICD-10-CM

## 2016-08-16 DIAGNOSIS — K3184 Gastroparesis: Secondary | ICD-10-CM | POA: Diagnosis present

## 2016-08-16 DIAGNOSIS — I10 Essential (primary) hypertension: Secondary | ICD-10-CM

## 2016-08-16 DIAGNOSIS — N390 Urinary tract infection, site not specified: Secondary | ICD-10-CM | POA: Diagnosis present

## 2016-08-16 LAB — BASIC METABOLIC PANEL
Anion gap: 6 (ref 5–15)
BUN: 25 mg/dL — AB (ref 6–20)
CALCIUM: 8.1 mg/dL — AB (ref 8.9–10.3)
CO2: 18 mmol/L — ABNORMAL LOW (ref 22–32)
CREATININE: 1.22 mg/dL — AB (ref 0.44–1.00)
Chloride: 115 mmol/L — ABNORMAL HIGH (ref 101–111)
GFR calc Af Amer: 58 mL/min — ABNORMAL LOW (ref >60)
GFR, EST NON AFRICAN AMERICAN: 50 mL/min — AB (ref >60)
Glucose, Bld: 221 mg/dL — ABNORMAL HIGH (ref 65–99)
POTASSIUM: 3.1 mmol/L — AB (ref 3.5–5.1)
SODIUM: 139 mmol/L (ref 135–145)

## 2016-08-16 LAB — CBC WITH DIFFERENTIAL/PLATELET
BASOS ABS: 0 10*3/uL (ref 0.0–0.1)
Basophils Relative: 0 %
EOS ABS: 0.2 10*3/uL (ref 0.0–0.7)
EOS PCT: 2 %
HCT: 25.7 % — ABNORMAL LOW (ref 36.0–46.0)
Hemoglobin: 8.5 g/dL — ABNORMAL LOW (ref 12.0–15.0)
LYMPHS ABS: 3.7 10*3/uL (ref 0.7–4.0)
Lymphocytes Relative: 37 %
MCH: 28.1 pg (ref 26.0–34.0)
MCHC: 33.1 g/dL (ref 30.0–36.0)
MCV: 85.1 fL (ref 78.0–100.0)
Monocytes Absolute: 0.6 10*3/uL (ref 0.1–1.0)
Monocytes Relative: 6 %
Neutro Abs: 5.3 10*3/uL (ref 1.7–7.7)
Neutrophils Relative %: 55 %
PLATELETS: 349 10*3/uL (ref 150–400)
RBC: 3.02 MIL/uL — AB (ref 3.87–5.11)
RDW: 14.5 % (ref 11.5–15.5)
WBC: 9.9 10*3/uL (ref 4.0–10.5)

## 2016-08-16 LAB — URINALYSIS, ROUTINE W REFLEX MICROSCOPIC
Bilirubin Urine: NEGATIVE
Glucose, UA: NEGATIVE mg/dL
Ketones, ur: NEGATIVE mg/dL
Nitrite: NEGATIVE
PROTEIN: 100 mg/dL — AB
Specific Gravity, Urine: 1.02 (ref 1.005–1.030)
pH: 5 (ref 5.0–8.0)

## 2016-08-16 LAB — I-STAT CG4 LACTIC ACID, ED: Lactic Acid, Venous: 1.89 mmol/L (ref 0.5–1.9)

## 2016-08-16 LAB — MAGNESIUM: Magnesium: 1.2 mg/dL — ABNORMAL LOW (ref 1.7–2.4)

## 2016-08-16 LAB — GLUCOSE, CAPILLARY
GLUCOSE-CAPILLARY: 191 mg/dL — AB (ref 65–99)
Glucose-Capillary: 222 mg/dL — ABNORMAL HIGH (ref 65–99)
Glucose-Capillary: 187 mg/dL — ABNORMAL HIGH (ref 65–99)

## 2016-08-16 LAB — PHOSPHORUS: PHOSPHORUS: 3.3 mg/dL (ref 2.5–4.6)

## 2016-08-16 LAB — POC URINE PREG, ED: Preg Test, Ur: NEGATIVE

## 2016-08-16 MED ORDER — DULOXETINE HCL 30 MG PO CPEP
30.0000 mg | ORAL_CAPSULE | Freq: Two times a day (BID) | ORAL | Status: DC
  Administered 2016-08-16 – 2016-08-23 (×13): 30 mg via ORAL
  Filled 2016-08-16 (×15): qty 1

## 2016-08-16 MED ORDER — SODIUM CHLORIDE 0.9 % IV SOLN
INTRAVENOUS | Status: DC
  Administered 2016-08-16 – 2016-08-18 (×2): via INTRAVENOUS

## 2016-08-16 MED ORDER — INSULIN ASPART 100 UNIT/ML ~~LOC~~ SOLN
0.0000 [IU] | Freq: Four times a day (QID) | SUBCUTANEOUS | Status: DC
Start: 2016-08-16 — End: 2016-08-20
  Administered 2016-08-16 (×2): 2 [IU] via SUBCUTANEOUS
  Administered 2016-08-16: 3 [IU] via SUBCUTANEOUS
  Administered 2016-08-17: 2 [IU] via SUBCUTANEOUS
  Administered 2016-08-17: 3 [IU] via SUBCUTANEOUS
  Administered 2016-08-17: 5 [IU] via SUBCUTANEOUS
  Administered 2016-08-17 – 2016-08-18 (×2): 3 [IU] via SUBCUTANEOUS
  Administered 2016-08-18: 5 [IU] via SUBCUTANEOUS
  Administered 2016-08-18: 3 [IU] via SUBCUTANEOUS
  Administered 2016-08-18: 5 [IU] via SUBCUTANEOUS
  Administered 2016-08-18 – 2016-08-19 (×2): 3 [IU] via SUBCUTANEOUS
  Administered 2016-08-19: 5 [IU] via SUBCUTANEOUS
  Administered 2016-08-19 – 2016-08-20 (×2): 3 [IU] via SUBCUTANEOUS

## 2016-08-16 MED ORDER — METOCLOPRAMIDE HCL 5 MG/ML IJ SOLN
10.0000 mg | Freq: Four times a day (QID) | INTRAMUSCULAR | Status: DC
  Administered 2016-08-16 – 2016-08-23 (×25): 10 mg via INTRAVENOUS
  Filled 2016-08-16 (×27): qty 2

## 2016-08-16 MED ORDER — POTASSIUM CHLORIDE IN NACL 20-0.9 MEQ/L-% IV SOLN
INTRAVENOUS | Status: DC
  Administered 2016-08-16: 06:00:00 via INTRAVENOUS
  Filled 2016-08-16 (×2): qty 1000

## 2016-08-16 MED ORDER — MORPHINE SULFATE (PF) 2 MG/ML IV SOLN
4.0000 mg | Freq: Once | INTRAVENOUS | Status: AC
  Administered 2016-08-16: 4 mg via INTRAVENOUS
  Filled 2016-08-16: qty 2

## 2016-08-16 MED ORDER — POTASSIUM CHLORIDE ER 10 MEQ PO TBCR: 20.0000 meq | EXTENDED_RELEASE_TABLET | Freq: Every day | ORAL | Status: DC

## 2016-08-16 MED ORDER — PANTOPRAZOLE SODIUM 40 MG IV SOLR
40.0000 mg | Freq: Two times a day (BID) | INTRAVENOUS | Status: DC
  Administered 2016-08-16 – 2016-08-21 (×11): 40 mg via INTRAVENOUS
  Filled 2016-08-16 (×11): qty 40

## 2016-08-16 MED ORDER — ONDANSETRON HCL 4 MG/2ML IJ SOLN: 4.0000 mg | Freq: Four times a day (QID) | INTRAMUSCULAR | Status: DC | PRN

## 2016-08-16 MED ORDER — HYDRALAZINE HCL 20 MG/ML IJ SOLN: 20.0000 mg | INTRAMUSCULAR | Status: DC | PRN

## 2016-08-16 MED ORDER — SODIUM CHLORIDE 0.9 % IV BOLUS (SEPSIS)
1000.0000 mL | Freq: Once | INTRAVENOUS | Status: AC
  Administered 2016-08-16: 1000 mL via INTRAVENOUS

## 2016-08-16 MED ORDER — SUCRALFATE 1 G PO TABS
1.0000 g | ORAL_TABLET | Freq: Three times a day (TID) | ORAL | Status: DC
  Administered 2016-08-16 – 2016-08-23 (×27): 1 g via ORAL
  Filled 2016-08-16 (×27): qty 1

## 2016-08-16 MED ORDER — ALLOPURINOL 100 MG PO TABS
100.0000 mg | ORAL_TABLET | Freq: Every day | ORAL | Status: DC
  Administered 2016-08-17 – 2016-08-23 (×6): 100 mg via ORAL
  Filled 2016-08-16 (×7): qty 1

## 2016-08-16 MED ORDER — METOPROLOL SUCCINATE ER 25 MG PO TB24
25.0000 mg | ORAL_TABLET | Freq: Every day | ORAL | Status: DC
  Administered 2016-08-16 – 2016-08-23 (×7): 25 mg via ORAL
  Filled 2016-08-16 (×7): qty 1

## 2016-08-16 MED ORDER — PANTOPRAZOLE SODIUM 40 MG IV SOLR
40.0000 mg | Freq: Every day | INTRAVENOUS | Status: DC
  Filled 2016-08-16: qty 40

## 2016-08-16 MED ORDER — MAGNESIUM SULFATE 4 GM/100ML IV SOLN
4.0000 g | Freq: Once | INTRAVENOUS | Status: AC
  Administered 2016-08-16: 4 g via INTRAVENOUS
  Filled 2016-08-16: qty 100

## 2016-08-16 MED ORDER — PANTOPRAZOLE SODIUM 40 MG PO TBEC
40.0000 mg | DELAYED_RELEASE_TABLET | Freq: Two times a day (BID) | ORAL | Status: DC
  Administered 2016-08-16: 40 mg via ORAL

## 2016-08-16 MED ORDER — NITROGLYCERIN 0.4 MG SL SUBL: 0.4000 mg | SUBLINGUAL_TABLET | SUBLINGUAL | Status: DC | PRN

## 2016-08-16 MED ORDER — METOCLOPRAMIDE HCL 10 MG PO TABS: 10.0000 mg | ORAL_TABLET | Freq: Three times a day (TID) | ORAL | Status: DC

## 2016-08-16 MED ORDER — ATORVASTATIN CALCIUM 10 MG PO TABS
20.0000 mg | ORAL_TABLET | Freq: Every day | ORAL | Status: DC
  Administered 2016-08-16 – 2016-08-22 (×7): 20 mg via ORAL
  Filled 2016-08-16 (×7): qty 2

## 2016-08-16 MED ORDER — MORPHINE SULFATE (PF) 2 MG/ML IV SOLN
4.0000 mg | INTRAVENOUS | Status: DC | PRN
  Administered 2016-08-16 – 2016-08-17 (×5): 4 mg via INTRAVENOUS
  Filled 2016-08-16 (×5): qty 2

## 2016-08-16 MED ORDER — POTASSIUM CHLORIDE 10 MEQ/100ML IV SOLN
10.0000 meq | INTRAVENOUS | Status: AC
  Administered 2016-08-16 (×4): 10 meq via INTRAVENOUS
  Filled 2016-08-16 (×4): qty 100

## 2016-08-16 MED ORDER — METOCLOPRAMIDE HCL 5 MG/ML IJ SOLN
10.0000 mg | Freq: Once | INTRAMUSCULAR | Status: AC
  Administered 2016-08-16: 10 mg via INTRAVENOUS
  Filled 2016-08-16: qty 2

## 2016-08-16 MED ORDER — METOPROLOL TARTRATE 5 MG/5ML IV SOLN
2.5000 mg | Freq: Four times a day (QID) | INTRAVENOUS | Status: DC
  Administered 2016-08-16: 2.5 mg via INTRAVENOUS
  Filled 2016-08-16: qty 5

## 2016-08-16 MED ORDER — CIPROFLOXACIN IN D5W 400 MG/200ML IV SOLN
400.0000 mg | Freq: Two times a day (BID) | INTRAVENOUS | Status: DC
  Administered 2016-08-16 – 2016-08-18 (×5): 400 mg via INTRAVENOUS
  Filled 2016-08-16 (×5): qty 200

## 2016-08-16 NOTE — ED Provider Notes (Signed)
Patient's tachycardia has improved. However she is still unable to take oral fluids and vomited (per her, I did not witness this) when she was orally challenged. Still having significant pain as well. This appears to be an exacerbation of chronic symptoms, I do not think CT imaging needed. Admit to the hospitalist for obs and fluids.   [place] Gambler, MD 08/16/16 [phone]

## 2016-08-16 NOTE — Care Management Note (Signed)
Case Management Note  Patient Details  Name: CAMELLA SEIM MRN: [id number] Date of Birth: 1965/08/01  Subjective/Objective:                   51 y.o. female with medical history significant of chest pain, diabetes mellitus, diabetic gastroparesis, PUD, gout, hyperlipidemia, hypertension, obesity, history of pneumonia, history of stroke who is returning to the emergency department for the second time in the last 3 days due to persistent abdominal pain, nausea and emesis since Tuesday. She complains of burning sensation in her esophagus. She mentions that she has frequent diarrhea as well. She denies constipation, melena or hematochezia. She denies fever, chills, chest pain, palpitations, dizziness, diaphoresis, PND, orthopnea or pitting edema lower extremities. She complains of suprapubic pain, but denies dysuria, frequency or hematuria.  She also has had 4 hospitalizations in the past 4-5 months due to intractable nausea and vomiting. The most recent one was on 07/04/2016, when she had a CT abdomen/pelvis with contrast that showed hepatic steatosis and an exophytic uterine fibroid, but no acute intra-abdominal/pelvic pathology. She had a normal gastric emptying study in February. Last year in October she had a low risk nuclear scan and her CT chest angiogram scans have been unremarkable, other than fatty infiltration of liver.  ED Course: Initially the patient was afebrile, with a normal O2 sat and respiratory rate, but her blood pressure was low 101/75 mmHg and her heart rate was in the 110s-120s. She received 2000 ML of normal saline bolus, metoclopramide 10 mg, morphine 4 mg, ondansetron 4 mg IVP and a GI cocktail, experiencing some relief. Her blood pressure increased and her tachycardia subsided. However, she failed the oral tolerance test, her abdominal pain, nausea and emesis recurred.   Action/Plan: Date:  August 16, 2016 Chart reviewed for concurrent status and case management  needs. Will continue to follow patient progress. Discharge Planning: following for needs Expected discharge date: [id number] Velva Harman, BSN, [place], [place]  Expected Discharge Date:                  Expected Discharge Plan:  Home/Self Care  In-House Referral:     Discharge planning Services  CM Consult  Post Acute Care Choice:    Choice offered to:     DME Arranged:    DME Agency:     HH Arranged:    HH Agency:     Status of Service:  In process, will continue to follow  If discussed at Long Length of Stay Meetings, dates discussed:    Additional Comments:  Leeroy Cha, RN 08/16/2016, 3:26 PM

## 2016-08-16 NOTE — H&P (Signed)
History and Physical    Deborah [name] ZOX:[id number] DOB: January 07, 1966 DOA: 08/15/2016  PCP: Nolon Bussing, PA   Patient coming from: Home.  I have personally briefly reviewed patient's old medical records in [place]  Chief Complaint: Abdominal pain, nausea and vomiting.  HPI: Deborah [name] is a 51 y.o. female with medical history significant of chest pain, diabetes mellitus, diabetic gastroparesis, PUD, gout, hyperlipidemia, hypertension, obesity, history of pneumonia, history of stroke who is returning to the emergency department for the second time in the last 3 days due to persistent abdominal pain, nausea and emesis since Tuesday. She complains of burning sensation in her esophagus. She mentions that she has frequent diarrhea as well. She denies constipation, melena or hematochezia. She denies fever, chills, chest pain, palpitations, dizziness, diaphoresis, PND, orthopnea or pitting edema lower extremities. She complains of suprapubic pain, but denies dysuria, frequency or hematuria.  She also has had 4 hospitalizations in the past 4-5 months due to intractable nausea and vomiting. The most recent one was on 07/04/2016, when she had a CT abdomen/pelvis with contrast that showed hepatic steatosis and an exophytic uterine fibroid, but no acute intra-abdominal/pelvic pathology. She had a normal gastric emptying study in February. Last year in October she had a low risk nuclear scan and her CT chest angiogram scans have been unremarkable, other than fatty infiltration of liver.  ED Course: Initially the patient was afebrile, with a normal O2 sat and respiratory rate, but her blood pressure was low 101/75 mmHg and her heart rate was in the 110s-120s. She received 2000 ML of normal saline bolus, metoclopramide 10 mg, morphine 4 mg, ondansetron 4 mg IVP and a GI cocktail, experiencing some relief. Her blood pressure increased and her tachycardia subsided. However, she failed  the oral tolerance test, her abdominal pain, nausea and emesis recurred.  Lab work and EKG: Urinary analysis showed a cloudy color, a small hemoglobin, mild proteinuria, negative ketones, positive leukocyte esterase with RBC 6-30, urinary WBC where TNTC, there were a few bacteria. CBC showed a white count of 10.7, hemoglobin 10.4 g/dL and platelets 406. D-dimer was 0.36. Sodium 136, potassium 3.2, chloride 109, bicarbonate 17, lactic acid #1 was 2.23 and lactic acid #2 was 1.89 mmol/L after IV fluids. Chest radiograph did not show any acute pathology.  Review of Systems: As per HPI otherwise 10 point review of systems negative.   Past Medical History:  Diagnosis Date  . Chest pain 12/2015  . Diabetes mellitus   . Fibromyalgia   . Gastric ulcer   . Gastroparesis   . Gout   . Hyperlipidemia   . Hypertension   . Obesity   . Pneumonia   . Stroke [facility]) 02/2011    Past Surgical History:  Procedure Laterality Date  . CATARACT EXTRACTION  01/2014  . CHOLECYSTECTOMY       reports that she has never smoked. She has never used smokeless tobacco. She reports that she does not drink alcohol or use drugs.  Allergies  Allergen Reactions  . Lisinopril Anaphylaxis    Tongue and mouth swelling  . Penicillins Palpitations    Has patient had a PCN reaction causing immediate rash, facial/tongue/throat swelling, SOB or lightheadedness with hypotension: Yes, heart races Has patient had a PCN reaction causing severe rash involving mucus membranes or skin necrosis: No Has patient had a PCN reaction that required hospitalization: Yes  Has patient had a PCN reaction occurring within the last 10 years: No   .  Valium [Diazepam] [name]  . Aspirin Other (See Comments)    Irritates stomach ulcer   . Food Hives and Swelling    Carrots, ketchup   . Nsaids Other (See Comments)    ULCER  . Tylenol [Acetaminophen] Other (See Comments)    Irritates stomach ulcer   . Tramadol Nausea And  Vomiting    Family History  Problem Relation Age of Onset  . Diabetes Mother   . Diabetes Father   . Heart disease Father   . Diabetes Sister   . Congestive Heart Failure Sister 47  . Diabetes Brother     Prior to Admission medications   Medication Sig Start Date End Date Taking? Authorizing Provider  albuterol (PROVENTIL HFA;VENTOLIN HFA) 108 (90 Base) MCG/ACT inhaler Inhale 2 puffs into the lungs every 6 (six) hours as needed for wheezing or shortness of breath. 09/23/15  Yes Debbe Odea, MD  allopurinol (ZYLOPRIM) 100 MG tablet Take 1 tablet (100 mg total) by mouth daily. 09/23/15  Yes Debbe Odea, MD  amLODipine (NORVASC) 10 MG tablet Take 1 tablet (10 mg total) by mouth daily. 06/08/16  Yes Nita Sells, MD  atorvastatin (LIPITOR) 20 MG tablet Take 20 mg by mouth at bedtime.   Yes [provider]  DULoxetine (CYMBALTA) 30 MG capsule Take 1 capsule (30 mg total) by mouth 2 (two) times daily. 09/23/15  Yes Debbe Odea, MD  metoCLOPramide (REGLAN) 10 MG tablet Take 1 tablet (10 mg total) by mouth 3 (three) times daily before meals. 06/08/16  Yes Nita Sells, MD  metoprolol succinate (TOPROL-XL) 25 MG 24 hr tablet Take 1 tablet (25 mg total) by mouth daily. 06/08/16  Yes Nita Sells, MD  nitroGLYCERIN (NITROSTAT) 0.4 MG SL tablet Place 1 tablet (0.4 mg total) under the tongue every 5 (five) minutes as needed for chest pain. 12/20/15  Yes [name], [name], MD  NOVOLOG MIX 70/30 (70-30) 100 UNIT/ML injection 75 units in am, 60 units  lunch, and 50 units pm Patient taking differently: Inject 50-75 Units into the skin See admin instructions. Inject 75 unit subcutaneously with breakfast, 60 units with lunch and 50 units with supper 06/08/16  Yes Nita Sells, MD  pantoprazole (PROTONIX) 40 MG tablet Take 1 tablet (40 mg total) by mouth 2 (two) times daily. 06/08/16  Yes Nita Sells, MD  potassium chloride (K-DUR) 10 MEQ tablet Take 2 tablets  (20 mEq total) by mouth daily. 08/13/16 08/20/16 Yes Cardama, Grayce Sessions, MD  sucralfate (CARAFATE) 1 g tablet Take 1 tablet (1 g total) by mouth 4 (four) times daily -  with meals and at bedtime. 06/08/16  Yes Nita Sells, MD  ondansetron (ZOFRAN ODT) 4 MG disintegrating tablet Take 1 tablet (4 mg total) by mouth every 8 (eight) hours as needed for nausea or vomiting. 08/13/16 08/16/16  [name], MD    Physical Exam: Vitals:   08/15/16 2330 08/16/16 0045 08/16/16 0251 08/16/16 0330  BP: 101/75 121/77 109/88 136/84  Pulse: (!) 110 (!) 106 (!) 103 (!) 103  Resp: 20 18 20 18   Temp:      TempSrc:      SpO2: 98% 100% 99% 99%  Weight:      Height:        Constitutional: NAD, calm, comfortable Eyes: PERRL, lids and conjunctivae normal ENMT: Mucous membranes are dry. Posterior pharynx clear of any exudate or lesions. Neck: normal, supple, no masses, no thyromegaly Respiratory: clear to auscultation bilaterally, no wheezing, no crackles. Normal  respiratory effort. No accessory muscle use.  Cardiovascular: Regular rate and rhythm, no murmurs / rubs / gallops. No extremity edema. 2+ pedal pulses. No carotid bruits.  Abdomen: Soft, positive epigastric and suprapubic tenderness, no guarding/rebound/masses palpated. No hepatosplenomegaly. Bowel sounds positive.  Musculoskeletal: no clubbing / cyanosis. Good ROM, no contractures. Normal muscle tone.  Skin: no rashes, lesions, ulcers on limited skin exam. Neurologic: CN 2-12 grossly intact. Sensation intact, DTR normal. Strength 5/5 in all 4.  Psychiatric: Normal judgment and insight. Alert and oriented x 3. Normal mood.    Labs on Admission: I have personally reviewed following labs and imaging studies  CBC:  Recent Labs Lab 08/13/16 1630 08/15/16 1950  WBC 12.2* 10.7*  HGB 10.9* 10.4*  HCT 32.5* 31.6*  MCV 84.9 85.4  PLT 421* 923*   Basic Metabolic Panel:  Recent Labs Lab 08/13/16 1630 08/15/16 1950  NA 137 136   K 3.3* 3.2*  CL 112* 109  CO2 17* 17*  GLUCOSE 147* 305*  BUN 23* 25*  CREATININE 1.41* 1.42*  CALCIUM 9.1 9.1   GFR: Estimated Creatinine Clearance: 63.7 mL/min (A) (by C-G formula based on SCr of 1.42 mg/dL (H)). Liver Function Tests:  Recent Labs Lab 08/13/16 1630 08/15/16 1950  AST 20 21  ALT 24 19  ALKPHOS 109 109  BILITOT 0.8 0.5  PROT 8.4* 7.8  ALBUMIN 3.7 3.6    Recent Labs Lab 08/13/16 1630 08/15/16 1950  LIPASE 33 30   No results for input(s): AMMONIA in the last 168 hours. Coagulation Profile: No results for input(s): INR, PROTIME in the last 168 hours. Cardiac Enzymes: No results for input(s): CKTOTAL, CKMB, CKMBINDEX, TROPONINI in the last 168 hours. BNP (last 3 results) No results for input(s): PROBNP in the last 8760 hours. HbA1C: No results for input(s): HGBA1C in the last 72 hours. CBG: No results for input(s): GLUCAP in the last 168 hours. Lipid Profile: No results for input(s): CHOL, HDL, LDLCALC, TRIG, CHOLHDL, LDLDIRECT in the last 72 hours. Thyroid Function Tests: No results for input(s): TSH, T4TOTAL, FREET4, T3FREE, THYROIDAB in the last 72 hours. Anemia Panel: No results for input(s): VITAMINB12, FOLATE, FERRITIN, TIBC, IRON, RETICCTPCT in the last 72 hours. Urine analysis:    Component Value Date/Time   COLORURINE YELLOW 08/16/2016 0302   APPEARANCEUR CLOUDY (A) 08/16/2016 0302   LABSPEC 1.020 08/16/2016 0302   PHURINE 5.0 08/16/2016 0302   GLUCOSEU NEGATIVE 08/16/2016 0302   HGBUR SMALL (A) 08/16/2016 0302   BILIRUBINUR NEGATIVE 08/16/2016 0302   KETONESUR NEGATIVE 08/16/2016 0302   PROTEINUR 100 (A) 08/16/2016 0302   UROBILINOGEN 0.2 10/02/2013 2108   NITRITE NEGATIVE 08/16/2016 0302   LEUKOCYTESUR LARGE (A) 08/16/2016 0302    Radiological Exams on Admission: CLINICAL DATA:  Acute onset of nausea and vomiting. Mid chest pain and upper abdominal pain. [name]. Initial encounter.  EXAM: CHEST  2  VIEW  COMPARISON:  Chest radiograph performed 06/17/2016  FINDINGS: The lungs are well-aerated and clear. There is no evidence of focal opacification, pleural effusion or pneumothorax.  The heart is normal in size; the mediastinal contour is within normal limits. No acute osseous abnormalities are seen. Clips are noted within the right upper quadrant, reflecting prior cholecystectomy.  IMPRESSION: No acute cardiopulmonary process seen.   Electronically Signed   By: Garald Balding M.D.   On: 08/15/2016 22:46 -----------------------------------------------------------------------------------------------------------------  04/25/2016 echocardiogram complete ------------------------------------------------------------------- Indications:      Chest pain 786.51.  ------------------------------------------------------------------- History:   PMH:  Chronic Kidney Disease. Abdominal Pain.  Stroke. Risk factors:  Hypertension. Diabetes mellitus.  ------------------------------------------------------------------- Study Conclusions  - Left ventricle: The cavity size was normal. Wall thickness was   increased in a pattern of mild LVH. Systolic function was normal.   The estimated ejection fraction was in the range of 60% to 65%.   Wall motion was normal; there were no regional wall motion   abnormalities. - Pericardium, extracardiac: A trivial pericardial effusion was   identified.  04/29/2016 nuclear medicine gastric emptying scan CLINICAL DATA:  Abdominal pain and nausea and vomiting for the past year.  EXAM: NUCLEAR MEDICINE GASTRIC EMPTYING SCAN  TECHNIQUE: After oral ingestion of radiolabeled meal, sequential abdominal images were obtained for 4 hours. Percentage of activity emptying the stomach was calculated at 1 hour, 2 hour, 3 hour, and 4 hours.  RADIOPHARMACEUTICALS:  2.1 mCi Tc-66m sulfur colloid in standardized meal  COMPARISON:  None in  PACs  FINDINGS: Expected location of the stomach in the left upper quadrant. Ingested meal empties the stomach gradually over the course of the study.  53% emptied at 1 hr ( normal >= 10%)  85% emptied at 2 hr ( normal >= 40%)  95% emptied at 3 hr ( normal >= 70%)  IMPRESSION: Normal gastric emptying study.   Electronically Signed   By: Brissia Delisa  [place] M.D.   On: 04/29/2016 13:44 ----------------------------------------------------------------------------------------------------------------   EKG: Independently reviewed. Vent. rate 118 BPM PR interval * ms QRS duration 78 ms QT/QTc 324/454 ms P-R-T axes 33 -35 64 Sinus tachycardia Left axis deviation Abnormal R-wave progression, late transition  Assessment/Plan Principal Problem:   Diabetic gastroparesis (HCC)   Intractable nausea and vomiting Observation/MedSurg. Keep nothing by mouth. Continue IV hydration and electrolyte supplementation. Metoclopramide 10 mg IVP every 6 hours. Protonix 40 mg IVP every Monday 4 hours. Morphine 4 mg IVP every 4 hours as needed. She has not been seen by GI in the past. I recommended to her outpatient GI evaluation.  Active Problems:   Urinary tract infection Ciprofloxacin 400 mg IVPB every 12 hours. Follow-up urine culture and sensitivity.    Essential hypertension, benign Metoprolol converted to IVP equivalent dosage. Metoprolol 2.5 mg IVP every 6 hours. Hold amlodipine 10 mg by mouth daily. Hydralazine 20 mg IVP every 4 hours as needed for SBP more than 160 mmHg. Monitor blood pressure.    Uncontrolled type 2 diabetes mellitus with diabetic neuropathy, with long-term current use of insulin ([place]) Currently nothing by mouth. Check CBG every 6 hours and provide sensitive sliding scale insulin coverage. Continue IV hydration.    CKD (chronic kidney disease), stage II The patient is allergic to ACE inhibitors. Monitor renal function and electrolytes.     Hypokalemia Supplementing through IV fluids. Follow-up potassium level. Check magnesium and phosphorus level.    Hypomagnesemia Replaced with 4 g of magnesium sulfate IVPB    Anemia Monitor hematocrit and hemoglobin.    DVT prophylaxis: SCDs. Code Status: Full code. Family Communication:  Disposition Plan: Observation for 24 hours, IV hydration and symptomatic treatment. Consults called:  Admission status: Observation/MedSurg.   Reubin Milan MD [facility] Pager [phone].  If 7PM-7AM, please contact night-coverage [email or web address] Password [email or web address]  08/16/2016, 4:40 AM

## 2016-08-16 NOTE — Progress Notes (Addendum)
PROGRESS NOTE    Deborah [name]  QIW:[id number] DOB: 1965/07/22 DOA: 08/15/2016 PCP: Nolon Bussing, PA    Brief Narrative:  51 year old female who presented with abdominal pain, nausea and vomiting. Patient is known to have allergies mellitus type II, diabetic gastroparesis, peptic ulcer disease, hypertension, obesity. She returned for a second time to the emergency department within the last 3 days due to persistent abdominal pain, nausea and vomiting. Symptoms present for last 6 days prior to hospitalization. Associated with a burning sensation in her esophagus. The initial physical examination blood pressure 101/75, heart rate 110, respiratory 20, oxygen saturation 98%, mucous membranes were dry, lungs were clear to auscultation, heart S1-S2 present rhythmic, her abdomen was soft, tender in the epigastric and suprapubic regions, no lower extremity edema. Sodium 139, potassium 3.1, chloride 115, bicarbonate 18, glucose 221, bicarbonate 25, BUN 25, creatinine 1.22, white count 9.0, hemoglobin 8.5, hematocrit 25.7, platelets 349. Urinalysis with too numerous to count white cells, active glucose. Negative pregnancy test. Chest x-ray negative for infiltrates. EKG with sinus tachycardia and left axis deviation.  Recent admitted to the medical floor with intractable nausea and vomiting due to gastroparesis complicated by urinary tract infection.  Assessment & Plan:   Principal Problem:   Diabetic gastroparesis ([place]) Active Problems:   Essential hypertension, benign   Uncontrolled type 2 diabetes mellitus with diabetic neuropathy, with long-term current use of insulin (HCC)   CKD (chronic kidney disease), stage II   Hypokalemia   Hypomagnesemia   Intractable nausea and vomiting   Urinary tract infection   Anemia   1. Acute on chronic gastroparesis with intractable nausea and vomiting. Will advance diet to clears, will continue medical supportive therapy with NS at 100 cc/HR, IV  antiacids and IV antiemetics. Sucralfate qid. Pain control with morphine.   2. Urine tract infection (present on admission). Will continue antibiotic therapy with ciprofloxacin, will follow on urine culture, blood cultures, cell count and temperature curve.   3. CKD stage 2. Renal function with cr at 1,2, will continue hydration with isotonic saline at 100 cc per hour, no signs of volume overload, will replete k with kcl with caution, follow renal panel in am, avoid hypotension or nephrotoxic medications.   4. Hypomagnesemia. Mg repletion with Iv mag sulfate, will follow on electrolytes in am.   5. HTN. Will continue blood pressure control with metoprolol, blood pressure systolic 417 to 408.   6. Depression. Will resume duloxetine.    7. T2DM. Will continue insulin sliding scale for glucose cover and monitoring, capillary glucose 172, 191, 222. Clear diabetic diet.     DVT prophylaxis: scd Code Status: Full  Family Communication:  Disposition Plan: Home .   Consultants:     Procedures:    Antimicrobials:    Subjective: Patient with persistent abdominal pain, moderate to severe, associated with nausea no further vomiting, no chest pain or dyspnea.   Objective: Vitals:   08/16/16 0251 08/16/16 0330 08/16/16 0430 08/16/16 0522  BP: 109/88 136/84 107/65 127/79  Pulse: (!) 103 (!) 103 98 93  Resp: 20 18 20 16   Temp:    97.6 F (36.4 C)  TempSrc:    Oral  SpO2: 99% 99% 97% 99%  Weight:    133.6 kg (294 lb 8.6 oz)  Height:    5\' 6"  (1.676 m)    Intake/Output Summary (Last 24 hours) at 08/16/16 1129 Last data filed at 08/16/16 0607  Gross per 24 hour  Intake  333.33 ml  Output                0 ml  Net           333.33 ml   Filed Weights   08/15/16 1916 08/16/16 0522  Weight: 128.8 kg (284 lb) 133.6 kg (294 lb 8.6 oz)    Examination:  General exam: deconditioned E ENT: no pallor or icterus, oral mucosa dry.  Respiratory system: Clear to auscultation.  Respiratory effort normal. Cardiovascular system: S1 & S2 heard, RRR. No JVD, murmurs, rubs, gallops or clicks. No pedal edema. Gastrointestinal system: Abdomen protuberant and  nondistended, soft and nontender. No organomegaly or masses felt. Normal bowel sounds heard. Central nervous system: Alert and oriented. No focal neurological deficits. Extremities: Symmetric 5 x 5 power. Skin: No rashes, lesions or ulcers    Data Reviewed: I have personally reviewed following labs and imaging studies  CBC:  Recent Labs Lab 08/13/16 1630 08/15/16 1950 08/16/16 0651  WBC 12.2* 10.7* 9.9  NEUTROABS  --   --  5.3  HGB 10.9* 10.4* 8.5*  HCT 32.5* 31.6* 25.7*  MCV 84.9 85.4 85.1  PLT 421* 406* 937   Basic Metabolic Panel:  Recent Labs Lab 08/13/16 1630 08/15/16 1950 08/16/16 0651  NA 137 136 139  K 3.3* 3.2* 3.1*  CL 112* 109 115*  CO2 17* 17* 18*  GLUCOSE 147* 305* 221*  BUN 23* 25* 25*  CREATININE 1.41* 1.42* 1.22*  CALCIUM 9.1 9.1 8.1*  MG  --  1.2*  --   PHOS  --  3.3  --    GFR: Estimated Creatinine Clearance: 75.8 mL/min (A) (by C-G formula based on SCr of 1.22 mg/dL (H)). Liver Function Tests:  Recent Labs Lab 08/13/16 1630 08/15/16 1950  AST 20 21  ALT 24 19  ALKPHOS 109 109  BILITOT 0.8 0.5  PROT 8.4* 7.8  ALBUMIN 3.7 3.6    Recent Labs Lab 08/13/16 1630 08/15/16 1950  LIPASE 33 30   No results for input(s): AMMONIA in the last 168 hours. Coagulation Profile: No results for input(s): INR, PROTIME in the last 168 hours. Cardiac Enzymes: No results for input(s): CKTOTAL, CKMB, CKMBINDEX, TROPONINI in the last 168 hours. BNP (last 3 results) No results for input(s): PROBNP in the last 8760 hours. HbA1C: No results for input(s): HGBA1C in the last 72 hours. CBG:  Recent Labs Lab 08/16/16 0601 08/16/16 1127  GLUCAP 191* 222*   Lipid Profile: No results for input(s): CHOL, HDL, LDLCALC, TRIG, CHOLHDL, LDLDIRECT in the last 72 hours. Thyroid  Function Tests: No results for input(s): TSH, T4TOTAL, FREET4, T3FREE, THYROIDAB in the last 72 hours. Anemia Panel: No results for input(s): VITAMINB12, FOLATE, FERRITIN, TIBC, IRON, RETICCTPCT in the last 72 hours. Sepsis Labs:  Recent Labs Lab 08/15/16 2242 08/16/16 0057  LATICACIDVEN 2.23* 1.89    No results found for this or any previous visit (from the past 240 hour(s)).       Radiology Studies: Dg Chest 2 View  Result Date: 08/15/2016 CLINICAL DATA:  Acute onset of nausea and vomiting. Mid chest pain and upper abdominal pain. [name]. Initial encounter. EXAM: CHEST  2 VIEW COMPARISON:  Chest radiograph performed 06/17/2016 FINDINGS: The lungs are well-aerated and clear. There is no evidence of focal opacification, pleural effusion or pneumothorax. The heart is normal in size; the mediastinal contour is within normal limits. No acute osseous abnormalities are seen. Clips are noted within the right upper quadrant, reflecting  prior cholecystectomy. IMPRESSION: No acute cardiopulmonary process seen. Electronically Signed   By: Garald Balding M.D.   On: 08/15/2016 22:46        Scheduled Meds: . allopurinol  100 mg Oral Daily  . atorvastatin  20 mg Oral QHS  . DULoxetine  30 mg Oral BID  . insulin aspart  0-9 Units Subcutaneous Q6H  . metoCLOPramide (REGLAN) injection  10 mg Intravenous Q6H  . metoCLOPramide  10 mg Oral TID AC  . metoprolol succinate  25 mg Oral Daily  . pantoprazole  40 mg Oral BID  . [START ON 08/17/2016] potassium chloride  20 mEq Oral Daily  . sucralfate  1 g Oral TID WC & HS   Continuous Infusions: . 0.9 % NaCl with KCl 20 mEq / L 100 mL/hr at 08/16/16 0946  . ciprofloxacin Stopped (08/16/16 6244)  . potassium chloride 10 mEq (08/16/16 1030)     LOS: 0 days        Kalev Temme Gerome Apley, MD [facility] Pager [phone]  If 7PM-7AM, please contact night-coverage [email or web address] Password [facility] 08/16/2016, 11:29 AM

## 2016-08-16 NOTE — Progress Notes (Signed)
Inpatient Diabetes Program Recommendations  AACE/ADA: New Consensus Statement on Inpatient Glycemic Control (2015)  Target Ranges:  Prepandial:   less than 140 mg/dL      Peak postprandial:   less than 180 mg/dL (1-2 hours)      Critically ill patients:  140 - 180 mg/dL   Review of Glycemic Control  Diabetes history: DM 2 Outpatient Diabetes medications: 70/30 75 units breakfast, 60 units lunch, 50 units supper Current orders for Inpatient glycemic control: Novolog Sensitive Q4  Inpatient Diabetes Program Recommendations:    Patient experiencing N/V. Patient takes 70/30 at home. Glucose trends increasing into the 200's. Consider low dose basal insulin if glucose trends continue to increase if patient is still unable to eat, Lantus 15 units (0.1 units/kg).  Thanks,  Tama Headings RN, MSN, [facility] Inpatient Diabetes Coordinator Team Pager [phone] (8a-5p)

## 2016-08-17 LAB — BASIC METABOLIC PANEL
Anion gap: 6 (ref 5–15)
BUN: 12 mg/dL (ref 6–20)
CALCIUM: 8.1 mg/dL — AB (ref 8.9–10.3)
CO2: 19 mmol/L — AB (ref 22–32)
CREATININE: 0.96 mg/dL (ref 0.44–1.00)
Chloride: 112 mmol/L — ABNORMAL HIGH (ref 101–111)
GFR calc non Af Amer: >60 mL/min (ref >60)
Glucose, Bld: 255 mg/dL — ABNORMAL HIGH (ref 65–99)
Potassium: 3.7 mmol/L (ref 3.5–5.1)
SODIUM: 137 mmol/L (ref 135–145)

## 2016-08-17 LAB — CBC WITH DIFFERENTIAL/PLATELET
BASOS PCT: 1 %
Basophils Absolute: 0 10*3/uL (ref 0.0–0.1)
EOS ABS: 0.2 10*3/uL (ref 0.0–0.7)
EOS PCT: 2 %
HCT: 27.5 % — ABNORMAL LOW (ref 36.0–46.0)
HEMOGLOBIN: 9.1 g/dL — AB (ref 12.0–15.0)
LYMPHS ABS: 1.7 10*3/uL (ref 0.7–4.0)
Lymphocytes Relative: 24 %
MCH: 28.4 pg (ref 26.0–34.0)
MCHC: 33.1 g/dL (ref 30.0–36.0)
MCV: 85.9 fL (ref 78.0–100.0)
MONOS PCT: 2 %
Monocytes Absolute: 0.2 10*3/uL (ref 0.1–1.0)
NEUTROS PCT: 71 %
Neutro Abs: 5.1 10*3/uL (ref 1.7–7.7)
PLATELETS: 335 10*3/uL (ref 150–400)
RBC: 3.2 MIL/uL — ABNORMAL LOW (ref 3.87–5.11)
RDW: 14.6 % (ref 11.5–15.5)
WBC: 7.2 10*3/uL (ref 4.0–10.5)

## 2016-08-17 LAB — GLUCOSE, CAPILLARY
GLUCOSE-CAPILLARY: 163 mg/dL — AB (ref 65–99)
GLUCOSE-CAPILLARY: 211 mg/dL — AB (ref 65–99)
GLUCOSE-CAPILLARY: 214 mg/dL — AB (ref 65–99)
Glucose-Capillary: 219 mg/dL — ABNORMAL HIGH (ref 65–99)
Glucose-Capillary: 301 mg/dL — ABNORMAL HIGH (ref 65–99)

## 2016-08-17 MED ORDER — AMLODIPINE BESYLATE 10 MG PO TABS
10.0000 mg | ORAL_TABLET | Freq: Every day | ORAL | Status: DC
  Administered 2016-08-17 – 2016-08-23 (×6): 10 mg via ORAL
  Filled 2016-08-17 (×7): qty 1

## 2016-08-17 MED ORDER — MORPHINE SULFATE (PF) 2 MG/ML IV SOLN
2.0000 mg | INTRAVENOUS | Status: DC | PRN
Start: 2016-08-17 — End: 2016-08-20
  Administered 2016-08-17 – 2016-08-20 (×9): 2 mg via INTRAVENOUS
  Filled 2016-08-17 (×9): qty 1

## 2016-08-17 NOTE — Progress Notes (Signed)
PROGRESS NOTE    Deborah [name]  ZOX:[id number] DOB: 12-02-1965 DOA: 08/15/2016 PCP: Nolon Bussing, PA    Brief Narrative:  51 year old female who presented with abdominal pain, nausea and vomiting. Patient is known to have allergies mellitus type II, diabetic gastroparesis, peptic ulcer disease, hypertension, obesity. She returned for a second time to the emergency department within the last 3 days due to persistent abdominal pain, nausea and vomiting. Symptoms present for last 6 days prior to hospitalization. Associated with a burning sensation in her esophagus. The initial physical examination blood pressure 101/75, heart rate 110, respiratory 20, oxygen saturation 98%, mucous membranes were dry, lungs were clear to auscultation, heart S1-S2 present rhythmic, her abdomen was soft, tender in the epigastric and suprapubic regions, no lower extremity edema. Sodium 139, potassium 3.1, chloride 115, bicarbonate 18, glucose 221, bicarbonate 25, BUN 25, creatinine 1.22, white count 9.0, hemoglobin 8.5, hematocrit 25.7, platelets 349. Urinalysis with too numerous to count white cells, active glucose. Negative pregnancy test. Chest x-ray negative for infiltrates. EKG with sinus tachycardia and left axis deviation.  Recent admitted to the medical floor with intractable nausea and vomiting due to gastroparesis complicated by urinary tract infection. Diet has been advanced, with good toleration.    Assessment & Plan:   Principal Problem:   Diabetic gastroparesis ([place]) Active Problems:   Essential hypertension, benign   Uncontrolled type 2 diabetes mellitus with diabetic neuropathy, with long-term current use of insulin (HCC)   CKD (chronic kidney disease), stage II   Hypokalemia   Hypomagnesemia   Intractable nausea and vomiting   Urinary tract infection   Anemia   Gastroparesis   1. Acute on chronic gastroparesis with intractable nausea and vomiting. Will continue IV antiemetics, IV  antiacids and IV fluids, will decrease dose of morphine and will continue to advance diet. Patient reports persistent symptoms but decrease intensity. Continue sucralfate.   2. Urine tract infection (present on admission). Antibiotic therapy with ciprofloxacin, patient has remained afebrile, no leukocytosis, to change to po antibiotic once patient able to tolerate po diet well.   3. CKD stage 2. Continue hydration with isotonic saline, will decrease rate to 75 ml per hour, serum cr down to 0,96 with K at 3,7. Follow renal panel in am.   4. Hypomagnesemia. Check mag in am.   5. HTN.  Blood pressure control with metoprolol, will resume alnlodipine blood pressure systolic 811. W  6. Depression. Tolerating well duloxetine.    7. T2DM.  Capillary glucose 163, 211, 219, 301. Will continue to advance diet as tolerated, will continue insulin sliding scale for glucose monitoring.  At home with insulin 75/25, 60 units am and 50 units pm. Continue to calculate requirements before resuming basal insulin.     DVT prophylaxis: scd Code Status: Full  Family Communication:  Disposition Plan: Home .   Consultants:     Procedures:    Antimicrobials:    Subjective: Patient with no chest pain or dyspnea. Nausea and vomiting improved in intensity but still present. Increases appetite, and requesting to advance diet. No diarrhea.   Objective: Vitals:   08/16/16 1411 08/16/16 2000 08/17/16 0504 08/17/16 0811  BP: 125/78 113/76 122/69 112/71  Pulse: 86 79  85  Resp: 18 18 20    Temp: 97.3 F (36.3 C) 97.7 F (36.5 C) 97.9 F (36.6 C) 97.5 F (36.4 C)  TempSrc: Oral Oral Oral Oral  SpO2: 100% 100% 100% 98%  Weight:      Height:  Intake/Output Summary (Last 24 hours) at 08/17/16 0923 Last data filed at 08/17/16 0600  Gross per 24 hour  Intake          2238.33 ml  Output                0 ml  Net          2238.33 ml   Filed Weights   08/15/16 1916 08/16/16 0522    Weight: 128.8 kg (284 lb) 133.6 kg (294 lb 8.6 oz)    Examination:  General exam: deconditioned  E ENT. No pallor or icterus, oral mucosa moist.  Respiratory system: Clear to auscultation. Respiratory effort normal. No wheezing, rales or rhonchi.  Cardiovascular system: S1 & S2 heard, RRR. No JVD, murmurs, rubs, gallops or clicks. No pedal edema. Gastrointestinal system: Abdomen is nondistended, soft and nontender. No organomegaly or masses felt. Normal bowel sounds heard. Central nervous system: Alert and oriented. No focal neurological deficits. Extremities: Symmetric 5 x 5 power. Skin: No rashes, lesions or ulcers     Data Reviewed: I have personally reviewed following labs and imaging studies  CBC:  Recent Labs Lab 08/13/16 1630 08/15/16 1950 08/16/16 0651 08/17/16 0852  WBC 12.2* 10.7* 9.9 7.2  NEUTROABS  --   --  5.3 5.1  HGB 10.9* 10.4* 8.5* 9.1*  HCT 32.5* 31.6* 25.7* 27.5*  MCV 84.9 85.4 85.1 85.9  PLT 421* 406* 349 009   Basic Metabolic Panel:  Recent Labs Lab 08/13/16 1630 08/15/16 1950 08/16/16 0651  NA 137 136 139  K 3.3* 3.2* 3.1*  CL 112* 109 115*  CO2 17* 17* 18*  GLUCOSE 147* 305* 221*  BUN 23* 25* 25*  CREATININE 1.41* 1.42* 1.22*  CALCIUM 9.1 9.1 8.1*  MG  --  1.2*  --   PHOS  --  3.3  --    GFR: Estimated Creatinine Clearance: 75.8 mL/min (A) (by C-G formula based on SCr of 1.22 mg/dL (H)). Liver Function Tests:  Recent Labs Lab 08/13/16 1630 08/15/16 1950  AST 20 21  ALT 24 19  ALKPHOS 109 109  BILITOT 0.8 0.5  PROT 8.4* 7.8  ALBUMIN 3.7 3.6    Recent Labs Lab 08/13/16 1630 08/15/16 1950  LIPASE 33 30   No results for input(s): AMMONIA in the last 168 hours. Coagulation Profile: No results for input(s): INR, PROTIME in the last 168 hours. Cardiac Enzymes: No results for input(s): CKTOTAL, CKMB, CKMBINDEX, TROPONINI in the last 168 hours. BNP (last 3 results) No results for input(s): PROBNP in the last 8760  hours. HbA1C: No results for input(s): HGBA1C in the last 72 hours. CBG:  Recent Labs Lab 08/16/16 0601 08/16/16 1127 08/16/16 1821 08/17/16 0007 08/17/16 0504  GLUCAP 191* 222* 187* 163* 211*   Lipid Profile: No results for input(s): CHOL, HDL, LDLCALC, TRIG, CHOLHDL, LDLDIRECT in the last 72 hours. Thyroid Function Tests: No results for input(s): TSH, T4TOTAL, FREET4, T3FREE, THYROIDAB in the last 72 hours. Anemia Panel: No results for input(s): VITAMINB12, FOLATE, FERRITIN, TIBC, IRON, RETICCTPCT in the last 72 hours. Sepsis Labs:  Recent Labs Lab 08/15/16 2242 08/16/16 0057  LATICACIDVEN 2.23* 1.89    No results found for this or any previous visit (from the past 240 hour(s)).       Radiology Studies: Dg Chest 2 View  Result Date: 08/15/2016 CLINICAL DATA:  Acute onset of nausea and vomiting. Mid chest pain and upper abdominal pain. [name]. Initial encounter. EXAM: CHEST  2  VIEW COMPARISON:  Chest radiograph performed 06/17/2016 FINDINGS: The lungs are well-aerated and clear. There is no evidence of focal opacification, pleural effusion or pneumothorax. The heart is normal in size; the mediastinal contour is within normal limits. No acute osseous abnormalities are seen. Clips are noted within the right upper quadrant, reflecting prior cholecystectomy. IMPRESSION: No acute cardiopulmonary process seen. Electronically Signed   By: Garald Balding M.D.   On: 08/15/2016 22:46        Scheduled Meds: . allopurinol  100 mg Oral Daily  . atorvastatin  20 mg Oral QHS  . DULoxetine  30 mg Oral BID  . insulin aspart  0-9 Units Subcutaneous Q6H  . metoCLOPramide (REGLAN) injection  10 mg Intravenous Q6H  . metoprolol succinate  25 mg Oral Daily  . pantoprazole (PROTONIX) IV  40 mg Intravenous Q12H  . sucralfate  1 g Oral TID WC & HS   Continuous Infusions: . sodium chloride 100 mL/hr at 08/16/16 1601  . ciprofloxacin Stopped (08/17/16 8377)     LOS: 1  day       Marcianne Ozbun Gerome Apley, MD [facility] Pager [phone]  If 7PM-7AM, please contact night-coverage [email or web address] Password [facility] 08/17/2016, 9:23 AM

## 2016-08-17 NOTE — Progress Notes (Signed)
Resumed care of pt. Agree with previous RN's assessment. Will continue to monitor and follow plan of care.

## 2016-08-18 LAB — GLUCOSE, CAPILLARY
GLUCOSE-CAPILLARY: 279 mg/dL — AB (ref 65–99)
GLUCOSE-CAPILLARY: 212 mg/dL — AB (ref 65–99)
GLUCOSE-CAPILLARY: 223 mg/dL — AB (ref 65–99)
Glucose-Capillary: 271 mg/dL — ABNORMAL HIGH (ref 65–99)

## 2016-08-18 LAB — MAGNESIUM: Magnesium: 1.3 mg/dL — ABNORMAL LOW (ref 1.7–2.4)

## 2016-08-18 MED ORDER — MAGNESIUM SULFATE 2 GM/50ML IV SOLN
2.0000 g | Freq: Once | INTRAVENOUS | Status: AC
  Administered 2016-08-18: 2 g via INTRAVENOUS
  Filled 2016-08-18: qty 50

## 2016-08-18 NOTE — Progress Notes (Signed)
Inpatient Diabetes Program Recommendations  AACE/ADA: New Consensus Statement on Inpatient Glycemic Control (2015)  Target Ranges:  Prepandial:   less than 140 mg/dL      Peak postprandial:   less than 180 mg/dL (1-2 hours)      Critically ill patients:  140 - 180 mg/dL   Results for Deborah [name], Deborah [name] (MRN [id number]) as of 08/18/2016 11:10  Ref. Range 08/17/2016 00:07 08/17/2016 05:04 08/17/2016 11:59 08/17/2016 16:48  Glucose-Capillary Latest Ref Range: 65 - 99 mg/dL 163 (H) 211 (H) 219 (H) 301 (H)   Results for Deborah [name], Deborah [name] (MRN [id number]) as of 08/18/2016 11:10  Ref. Range 08/17/2016 23:55 08/18/2016 05:26  Glucose-Capillary Latest Ref Range: 65 - 99 mg/dL 214 (H) 279 (H)    Outpatient DM meds: 70/30 Insulin:  75 units breakfast, 60 units lunch, 50 units supper  Current Insulin Orders: Novolog Sensitive Correction Scale/ SSI (0-9 units) Q6 hours      Patient takes 70/30 Insulin at home.   Glucose trends increasing into the 200's.   MD- Please consider the following in-hospital insulin adjustments while patient not able to eat normally:  1. Start Lantus 25 units daily to start (0.2 units/kg)  2. Increase frequency of Novolog SSI to Q4 hours (currently ordered as Q6 hours)     --Will follow patient during hospitalization--  Wyn Quaker RN, MSN, CDE Diabetes Coordinator Inpatient Glycemic Control Team Team Pager: [phone] (8a-5p)

## 2016-08-18 NOTE — Progress Notes (Signed)
PROGRESS NOTE    Deborah [name]  EPP:[id number] DOB: April 03, 1965 DOA: 08/15/2016 PCP: Nolon Bussing, PA    Brief Narrative:  51 year old female who presented with abdominal pain, nausea and vomiting. Patient is known to have allergies mellitus type II, diabetic gastroparesis, peptic ulcer disease, hypertension, obesity. She returned for a second time to the emergency department within the last 3 days due to persistent abdominal pain, nausea and vomiting. Symptoms present for last 6 days prior to hospitalization. Associated with a burning sensation in her esophagus. The initial physical examination blood pressure 101/75, heart rate 110, respiratory 20, oxygen saturation 98%, mucous membranes were dry, lungs were clear to auscultation, heart S1-S2 present rhythmic, her abdomen was soft, tender in the epigastric and suprapubic regions, no lower extremity edema. Sodium 139, potassium 3.1, chloride 115, bicarbonate 18, glucose 221, bicarbonate 25, BUN 25, creatinine 1.22, white count 9.0, hemoglobin 8.5, hematocrit 25.7, platelets 349. Urinalysis with too numerous to count white cells, active glucose. Negative pregnancy test. Chest x-ray negative for infiltrates. EKG with sinus tachycardia and left axis deviation.  Recent admitted to the medical floor with intractable nausea and vomiting due to gastroparesis complicated by urinary tract infection. Diet has been advanced, with good toleration.    Assessment & Plan:   Principal Problem:   Diabetic gastroparesis ([place]) Active Problems:   Essential hypertension, benign   Uncontrolled type 2 diabetes mellitus with diabetic neuropathy, with long-term current use of insulin (HCC)   CKD (chronic kidney disease), stage II   Hypokalemia   Hypomagnesemia   Intractable nausea and vomiting   Urinary tract infection   Anemia   Gastroparesis    Acute on chronic gastroparesis with intractable nausea and vomiting.  --She has frequent admissions for  gastroparesis, though this has not seems to affected her weight --Continue IV antiemetics, IV antiacids and IV fluids,  decrease dose of morphine, advance diet as tolerated.  --Patient reports persistent symptoms but decrease intensity.  --she reports primary care physician started her on sucralfate which has helped, this is continued. She denies constipation --she reports not followed by gi which she will likely benefit from doing, due to frequent symptom burden. She report does not remember if she has followed with a gi specialist before  ? Urine tract infection (present on admission).  UA showed wbc TNTC, few bacteria,  urine culture was not done, patient denies symptom, patient has remained afebrile, no leukocytosis,  She received Antibiotic therapy with ciprofloxacin for three days, she report urine is clear, will stop abx,    AKI/CKD stage 2.  Cr 1.4 on admission, cr 0.9 on 6/13 after hydration   Hypokalemia/Hypomagnesemia. Replace k/mag,   Insulin dependent T2DM.   a1c 9.7 At home with insulin 75/25, 60 units am and 50 units pm.  Long acting insulin held in the hospital due to inconsistent oral intake, she is on ssi   HTN.  Blood pressure control with metoprolol,  resume amlodipine blood pressure systolic.  Oral meds intake not consistent though due to n/v  Depression. Tolerating well duloxetine.     Morbid obesity:  Body mass index is 47.54 kg/m.     DVT prophylaxis: scd Code Status: Full  Family Communication: patient Disposition Plan: Home when able to tolerate diet   Consultants:   none  Procedures:  none  Antimicrobials:  cipro  Subjective: She report not feeling so well today, oral intake seems has improved ,but inconsistent , continue on ivf   Objective: Vitals:   08/17/16  1400 08/17/16 2022 08/18/16 0519 08/18/16 1446  BP: (!) 148/83 106/65 124/77 122/80  Pulse: 89 86 88 80  Resp:  18 18 18   Temp: 97.6 F (36.4 C) 97.8 F (36.6  C) 98.2 F (36.8 C) 97.8 F (36.6 C)  TempSrc: Oral Oral Oral Oral  SpO2: 100% 100% 99% 95%  Weight:      Height:        Intake/Output Summary (Last 24 hours) at 08/18/16 1629 Last data filed at 08/18/16 1300  Gross per 24 hour  Intake              240 ml  Output                0 ml  Net              240 ml   Filed Weights   08/15/16 1916 08/16/16 0522  Weight: 128.8 kg (284 lb) 133.6 kg (294 lb 8.6 oz)    Examination:  General exam: deconditioned , obese E ENT. No pallor or icterus, oral mucosa moist.  Respiratory system: Clear to auscultation. Respiratory effort normal. No wheezing, rales or rhonchi.  Cardiovascular system: S1 & S2 heard, RRR. No JVD, murmurs, rubs, gallops or clicks. No pedal edema. Gastrointestinal system: Abdomen is nondistended, soft and nontender. No organomegaly or masses felt. Normal bowel sounds heard. Central nervous system: Alert and oriented. No focal neurological deficits. Extremities: Symmetric 5 x 5 power. Skin: No rashes, lesions or ulcers     Data Reviewed: I have personally reviewed following labs and imaging studies  CBC:  Recent Labs Lab 08/13/16 1630 08/15/16 1950 08/16/16 0651 08/17/16 0852  WBC 12.2* 10.7* 9.9 7.2  NEUTROABS  --   --  5.3 5.1  HGB 10.9* 10.4* 8.5* 9.1*  HCT 32.5* 31.6* 25.7* 27.5*  MCV 84.9 85.4 85.1 85.9  PLT 421* 406* 349 128   Basic Metabolic Panel:  Recent Labs Lab 08/13/16 1630 08/15/16 1950 08/16/16 0651 08/17/16 0852 08/18/16 0758  NA 137 136 139 137  --   K 3.3* 3.2* 3.1* 3.7  --   CL 112* 109 115* 112*  --   CO2 17* 17* 18* 19*  --   GLUCOSE 147* 305* 221* 255*  --   BUN 23* 25* 25* 12  --   CREATININE 1.41* 1.42* 1.22* 0.96  --   CALCIUM 9.1 9.1 8.1* 8.1*  --   MG  --  1.2*  --   --  1.3*  PHOS  --  3.3  --   --   --    GFR: Estimated Creatinine Clearance: 96.3 mL/min (by C-G formula based on SCr of 0.96 mg/dL). Liver Function Tests:  Recent Labs Lab 08/13/16 1630  08/15/16 1950  AST 20 21  ALT 24 19  ALKPHOS 109 109  BILITOT 0.8 0.5  PROT 8.4* 7.8  ALBUMIN 3.7 3.6    Recent Labs Lab 08/13/16 1630 08/15/16 1950  LIPASE 33 30   No results for input(s): AMMONIA in the last 168 hours. Coagulation Profile: No results for input(s): INR, PROTIME in the last 168 hours. Cardiac Enzymes: No results for input(s): CKTOTAL, CKMB, CKMBINDEX, TROPONINI in the last 168 hours. BNP (last 3 results) No results for input(s): PROBNP in the last 8760 hours. HbA1C: No results for input(s): HGBA1C in the last 72 hours. CBG:  Recent Labs Lab 08/17/16 1159 08/17/16 1648 08/17/16 2355 08/18/16 0526 08/18/16 1205  GLUCAP 219* 301* 214* 279* 212*  Lipid Profile: No results for input(s): CHOL, HDL, LDLCALC, TRIG, CHOLHDL, LDLDIRECT in the last 72 hours. Thyroid Function Tests: No results for input(s): TSH, T4TOTAL, FREET4, T3FREE, THYROIDAB in the last 72 hours. Anemia Panel: No results for input(s): VITAMINB12, FOLATE, FERRITIN, TIBC, IRON, RETICCTPCT in the last 72 hours. Sepsis Labs:  Recent Labs Lab 08/15/16 2242 08/16/16 0057  LATICACIDVEN 2.23* 1.89    No results found for this or any previous visit (from the past 240 hour(s)).       Radiology Studies: No results found.      Scheduled Meds: . allopurinol  100 mg Oral Daily  . amLODipine  10 mg Oral Daily  . atorvastatin  20 mg Oral QHS  . DULoxetine  30 mg Oral BID  . insulin aspart  0-9 Units Subcutaneous Q6H  . metoCLOPramide (REGLAN) injection  10 mg Intravenous Q6H  . metoprolol succinate  25 mg Oral Daily  . pantoprazole (PROTONIX) IV  40 mg Intravenous Q12H  . sucralfate  1 g Oral TID WC & HS   Continuous Infusions: . sodium chloride 50 mL/hr at 08/17/16 0952     LOS: 2 days     Time > 85mins  Deborah Kronberg, MD PhD [facility] Pager [phone]  If 7PM-7AM, please contact night-coverage [email or web address] Password [facility] 08/18/2016, 4:29 PM

## 2016-08-19 ENCOUNTER — Inpatient Hospital Stay (HOSPITAL_COMMUNITY): Payer: Self-pay

## 2016-08-19 LAB — BASIC METABOLIC PANEL
Anion gap: 9 (ref 5–15)
BUN: 10 mg/dL (ref 6–20)
CALCIUM: 8.5 mg/dL — AB (ref 8.9–10.3)
CO2: 23 mmol/L (ref 22–32)
CREATININE: 0.78 mg/dL (ref 0.44–1.00)
Chloride: 106 mmol/L (ref 101–111)
GFR calc non Af Amer: >60 mL/min (ref >60)
Glucose, Bld: 198 mg/dL — ABNORMAL HIGH (ref 65–99)
Potassium: 3 mmol/L — ABNORMAL LOW (ref 3.5–5.1)
SODIUM: 138 mmol/L (ref 135–145)

## 2016-08-19 LAB — MAGNESIUM: MAGNESIUM: 1.3 mg/dL — AB (ref 1.7–2.4)

## 2016-08-19 LAB — GLUCOSE, CAPILLARY
GLUCOSE-CAPILLARY: 201 mg/dL — AB (ref 65–99)
GLUCOSE-CAPILLARY: 208 mg/dL — AB (ref 65–99)
GLUCOSE-CAPILLARY: 275 mg/dL — AB (ref 65–99)
Glucose-Capillary: 194 mg/dL — ABNORMAL HIGH (ref 65–99)
Glucose-Capillary: 243 mg/dL — ABNORMAL HIGH (ref 65–99)

## 2016-08-19 MED ORDER — INSULIN GLARGINE 100 UNIT/ML ~~LOC~~ SOLN
25.0000 [IU] | Freq: Every day | SUBCUTANEOUS | Status: DC
  Administered 2016-08-19: 25 [IU] via SUBCUTANEOUS
  Filled 2016-08-19 (×2): qty 0.25

## 2016-08-19 MED ORDER — MAGNESIUM SULFATE 2 GM/50ML IV SOLN
2.0000 g | Freq: Once | INTRAVENOUS | Status: AC
  Administered 2016-08-19: 2 g via INTRAVENOUS
  Filled 2016-08-19: qty 50

## 2016-08-19 MED ORDER — LEVOFLOXACIN IN D5W 750 MG/150ML IV SOLN
750.0000 mg | INTRAVENOUS | Status: DC
  Administered 2016-08-19 – 2016-08-21 (×3): 750 mg via INTRAVENOUS
  Filled 2016-08-19 (×4): qty 150

## 2016-08-19 MED ORDER — POTASSIUM CHLORIDE 10 MEQ/100ML IV SOLN
10.0000 meq | INTRAVENOUS | Status: AC
  Administered 2016-08-19 (×4): 10 meq via INTRAVENOUS
  Filled 2016-08-19 (×4): qty 100

## 2016-08-19 NOTE — Progress Notes (Addendum)
Inpatient Diabetes Program Recommendations  AACE/ADA: New Consensus Statement on Inpatient Glycemic Control (2015)  Target Ranges:  Prepandial:   less than 140 mg/dL      Peak postprandial:   less than 180 mg/dL (1-2 hours)      Critically ill patients:  140 - 180 mg/dL   Results for ENDA, SANTO (MRN [id number]) as of 08/19/2016 09:30  Ref. Range 08/17/2016 23:55 08/18/2016 05:26 08/18/2016 12:05 08/18/2016 16:51 08/18/2016 21:58 08/19/2016 03:24 08/19/2016 05:29  Glucose-Capillary Latest Ref Range: 65 - 99 mg/dL 214 (H) 279 (H) 212 (H) 223 (H) 271 (H) 201 (H) 208 (H)    Outpatient DM meds:70/30 Insulin:  75 units breakfast, 60 units lunch, 50 units supper  Current Insulin Orders: Novolog Sensitive Correction Scale/ SSI (0-9 units) Q6 hours     Patient takes 70/30 Insulin at home.   Glucose trends increasing into the 200's.   MD- Please consider the following in-hospital insulin adjustments:  1. Start Lantus 25 units daily to start (0.2 units/kg)  2. Change Novolog SSI to TID AC + HS (patient now eating PO diet)    --Will follow patient during hospitalization--  Wyn Quaker RN, MSN, CDE Diabetes Coordinator Inpatient Glycemic Control Team Team Pager: [phone] (8a-5p)

## 2016-08-19 NOTE — Progress Notes (Signed)
PROGRESS NOTE    LEGACY CARRENDER  XBL:[id number] DOB: [birth year] DOA: 08/15/2016 PCP: Nolon Bussing, PA    Brief Narrative:  51 year old female who presented with abdominal pain, nausea and vomiting. Patient is known to have allergies mellitus type II, diabetic gastroparesis, peptic ulcer disease, hypertension, obesity. She returned for a second time to the emergency department within the last 3 days due to persistent abdominal pain, nausea and vomiting. Symptoms present for last 6 days prior to hospitalization. Associated with a burning sensation in her esophagus. The initial physical examination blood pressure 101/75, heart rate 110, respiratory 20, oxygen saturation 98%, mucous membranes were dry, lungs were clear to auscultation, heart S1-S2 present rhythmic, her abdomen was soft, tender in the epigastric and suprapubic regions, no lower extremity edema. Sodium 139, potassium 3.1, chloride 115, bicarbonate 18, glucose 221, bicarbonate 25, BUN 25, creatinine 1.22, white count 9.0, hemoglobin 8.5, hematocrit 25.7, platelets 349. Urinalysis with too numerous to count white cells, active glucose. Negative pregnancy test. Chest x-ray negative for infiltrates. EKG with sinus tachycardia and left axis deviation.  Recent admitted to the medical floor with intractable nausea and vomiting due to gastroparesis complicated by urinary tract infection. Diet has been advanced, with good toleration.    Assessment & Plan:   Principal Problem:   Diabetic gastroparesis ([place]) Active Problems:   Essential hypertension, benign   Uncontrolled type 2 diabetes mellitus with diabetic neuropathy, with long-term current use of insulin (HCC)   CKD (chronic kidney disease), stage II   Hypokalemia   Hypomagnesemia   Intractable nausea and vomiting   Urinary tract infection   Anemia   Gastroparesis  Cough:  --She report cough is new, cxr  "Right perihilar and infrahilar opacity is noted concerning for  pneumonia or atelectasis. Continued radiographic follow-up is Recommended. - she was treated with cipro initially due to concern about possible uti, cipro stopped on 6/13, will start levaquin today to cover possible aspiration pneumonia (she is allergic to pcn)    Acute on chronic gastroparesis with intractable nausea and vomiting.  --She has frequent admissions for gastroparesis, though this has not seems to affected her weight --Continue IV antiemetics, IV antiacids and IV fluids,  decrease dose of morphine, advance diet as tolerated.  --Patient reports persistent symptoms but decrease intensity. She seems to start to eat more but still inconsistent --she reports primary care physician started her on sucralfate which has helped, this is continued. She denies constipation --she reports not followed by gi which she will likely benefit from doing, due to frequent symptom burden.   ? Urine tract infection (present on admission).  UA showed wbc TNTC, few bacteria,  urine culture was not done, patient denies symptom, patient has remained afebrile, no leukocytosis,  She received Antibiotic therapy with ciprofloxacin for three days, she report urine is clear  AKI/CKD stage 2.  Cr 1.4 on admission, cr 0.78 on 6/14 after hydration   Hypokalemia/Hypomagnesemia.  Remain low likely due to poor oral intake She report vomiting slowed down Continue Replace k/mag, she prefer to get replacement from iv, iv k and mag.   Insulin dependent T2DM.   a1c 9.7 At home with insulin 75/25, 60 units am and 50 units pm.  Long acting insulin held initially  due to inconsistent oral intake, patient seems to start eating more, restart long acting insulin slowly, continue ssi   HTN.  Blood pressure control with metoprolol,  resume amlodipine blood pressure systolic.  Oral meds intake not consistent though due  to n/v  Depression. Tolerating well duloxetine.     Morbid obesity:  Body mass index is 47.54  kg/m.     DVT prophylaxis: scd Code Status: Full  Family Communication: patient Disposition Plan: Home when able to tolerate diet   Consultants:   none  Procedures:  none  Antimicrobials:  cipro  Subjective: She report vomited once this am, overall feeling better, less ab pain,  oral intake seems has improved ,but inconsistent , continue on ivf   Objective: Vitals:   08/18/16 0519 08/18/16 1446 08/18/16 2154 08/19/16 0601  BP: 124/77 122/80 136/75 (!) 136/57  Pulse: 88 80 94 88  Resp: 18 18 18 20   Temp: 98.2 F (36.8 C) 97.8 F (36.6 C) 98.1 F (36.7 C) 98.2 F (36.8 C)  TempSrc: Oral Oral Oral Oral  SpO2: 99% 95% 97% 97%  Weight:      Height:        Intake/Output Summary (Last 24 hours) at 08/19/16 1213 Last data filed at 08/19/16 0932  Gross per 24 hour  Intake             1080 ml  Output                0 ml  Net             1080 ml   Filed Weights   08/15/16 1916 08/16/16 0522  Weight: 128.8 kg (284 lb) 133.6 kg (294 lb 8.6 oz)    Examination:  General exam: deconditioned , obese E ENT. No pallor or icterus, oral mucosa moist.  Respiratory system: Clear to auscultation. Respiratory effort normal. No wheezing, rales or rhonchi.  Cardiovascular system: S1 & S2 heard, RRR. No JVD, murmurs, rubs, gallops or clicks. No pedal edema. Gastrointestinal system: mild tender epigastric region, Abdomen is nondistended, soft , no rebound , no guarding, Normal bowel sounds heard. Central nervous system: Alert and oriented. No focal neurological deficits. Extremities: Symmetric 5 x 5 power. Skin: No rashes, lesions or ulcers     Data Reviewed: I have personally reviewed following labs and imaging studies  CBC:  Recent Labs Lab 08/13/16 1630 08/15/16 1950 08/16/16 0651 08/17/16 0852  WBC 12.2* 10.7* 9.9 7.2  NEUTROABS  --   --  5.3 5.1  HGB 10.9* 10.4* 8.5* 9.1*  HCT 32.5* 31.6* 25.7* 27.5*  MCV 84.9 85.4 85.1 85.9  PLT 421* 406* 349 751    Basic Metabolic Panel:  Recent Labs Lab 08/13/16 1630 08/15/16 1950 08/16/16 0651 08/17/16 0852 08/18/16 0758 08/19/16 0739  NA 137 136 139 137  --  138  K 3.3* 3.2* 3.1* 3.7  --  3.0*  CL 112* 109 115* 112*  --  106  CO2 17* 17* 18* 19*  --  23  GLUCOSE 147* 305* 221* 255*  --  198*  BUN 23* 25* 25* 12  --  10  CREATININE 1.41* 1.42* 1.22* 0.96  --  0.78  CALCIUM 9.1 9.1 8.1* 8.1*  --  8.5*  MG  --  1.2*  --   --  1.3* 1.3*  PHOS  --  3.3  --   --   --   --    GFR: Estimated Creatinine Clearance: 115.6 mL/min (by C-G formula based on SCr of 0.78 mg/dL). Liver Function Tests:  Recent Labs Lab 08/13/16 1630 08/15/16 1950  AST 20 21  ALT 24 19  ALKPHOS 109 109  BILITOT 0.8 0.5  PROT 8.4* 7.8  ALBUMIN 3.7  3.6    Recent Labs Lab 08/13/16 1630 08/15/16 1950  LIPASE 33 30   No results for input(s): AMMONIA in the last 168 hours. Coagulation Profile: No results for input(s): INR, PROTIME in the last 168 hours. Cardiac Enzymes: No results for input(s): CKTOTAL, CKMB, CKMBINDEX, TROPONINI in the last 168 hours. BNP (last 3 results) No results for input(s): PROBNP in the last 8760 hours. HbA1C: No results for input(s): HGBA1C in the last 72 hours. CBG:  Recent Labs Lab 08/18/16 1205 08/18/16 1651 08/18/16 2158 08/19/16 0324 08/19/16 0529  GLUCAP 212* 223* 271* 201* 208*   Lipid Profile: No results for input(s): CHOL, HDL, LDLCALC, TRIG, CHOLHDL, LDLDIRECT in the last 72 hours. Thyroid Function Tests: No results for input(s): TSH, T4TOTAL, FREET4, T3FREE, THYROIDAB in the last 72 hours. Anemia Panel: No results for input(s): VITAMINB12, FOLATE, FERRITIN, TIBC, IRON, RETICCTPCT in the last 72 hours. Sepsis Labs:  Recent Labs Lab 08/15/16 2242 08/16/16 0057  LATICACIDVEN 2.23* 1.89    No results found for this or any previous visit (from the past 240 hour(s)).       Radiology Studies: Dg Chest Port 1 View  Result Date: 08/19/2016 CLINICAL  DATA:  Cough. EXAM: PORTABLE CHEST 1 VIEW COMPARISON:  Radiographs of August 15, 2016. FINDINGS: Normal cardiac silhouette is noted. No pneumothorax or pleural effusion is noted. Right perihilar and infrahilar opacity is noted which is increased compared to prior exam concerning for pneumonia or atelectasis. Left lung is clear. The visualized skeletal structures are unremarkable. IMPRESSION: Right perihilar and infrahilar opacity is noted concerning for pneumonia or atelectasis. Continued radiographic follow-up is recommended. Electronically Signed   By: Marijo Conception, M.D.   On: 08/19/2016 10:41        Scheduled Meds: . allopurinol  100 mg Oral Daily  . amLODipine  10 mg Oral Daily  . atorvastatin  20 mg Oral QHS  . DULoxetine  30 mg Oral BID  . insulin aspart  0-9 Units Subcutaneous Q6H  . insulin glargine  25 Units Subcutaneous QHS  . metoCLOPramide (REGLAN) injection  10 mg Intravenous Q6H  . metoprolol succinate  25 mg Oral Daily  . pantoprazole (PROTONIX) IV  40 mg Intravenous Q12H  . sucralfate  1 g Oral TID WC & HS   Continuous Infusions: . sodium chloride 50 mL/hr at 08/18/16 1823  . levofloxacin (LEVAQUIN) IV    . magnesium sulfate 1 - 4 g bolus IVPB 2 g (08/19/16 1135)  . potassium chloride 10 mEq (08/19/16 1136)     LOS: 3 days     Time > 19mins  Tyisha Cressy, MD PhD [facility] Pager [phone]  If 7PM-7AM, please contact night-coverage [email or web address] Password [facility] 08/19/2016, 12:13 PM

## 2016-08-19 NOTE — Evaluation (Addendum)
Physical Therapy Evaluation Patient Details Name: Deborah [name] MRN: [id number] DOB: 09-May-1965 Today's Date: 08/19/2016   History of Present Illness  51 year old female who presented with abdominal pain, nausea and vomiting. Patient is known to have diabetes mellitus type II, diabetic gastroparesis, peptic ulcer disease, hypertension, obesity. She returned for a second time to the emergency department within the last 3 days due to persistent abdominal pain, nausea and vomiting.  Dx gastroparesis, UTI.   Clinical Impression  Pt admitted with above diagnosis. Pt currently with functional limitations due to the deficits listed below (see PT Problem List). Pt ambulated 130' with RW without loss of balance. She reports 1-2 month h/o mild dizziness, but no falls, when walking (pt reports orthostatics have been negative, RN stated dizziness may be side effect of Reglan). Pt is expected to be able to return home where she was independent with mobility using a cane and walker.  Pt will benefit from skilled PT to increase their independence and safety with mobility to allow discharge to the venue listed below.       Follow Up Recommendations No PT follow up    Equipment Recommendations  None recommended by PT    Recommendations for Other Services       Precautions / Restrictions Precautions Precaution Comments: denies falls in past 1 year Restrictions Weight Bearing Restrictions: No      Mobility  Bed Mobility Overal bed mobility: Independent                Transfers Overall transfer level: Modified independent Equipment used: Rolling walker (2 wheeled)                Ambulation/Gait Ambulation/Gait assistance: Supervision Ambulation Distance (Feet): 130 Feet Assistive device: Rolling walker (2 wheeled) Gait Pattern/deviations: Step-through pattern;Trunk flexed Gait velocity: decr Gait velocity interpretation: Below normal speed for age/gender General Gait  Details: steady, no LOB, reports 1-2 month h/o dizziness with walking (pt reports orthostatics were checked in hospital and were negative though not seen in chart), no dizziness with head turns  Stairs            Wheelchair Mobility    Modified Rankin (Stroke Patients Only)       Balance Overall balance assessment: Modified Independent                                           Pertinent Vitals/Pain Pain Assessment: 0-10 Pain Score: 9  Pain Location: abdomen Pain Descriptors / Indicators: Aching Pain Intervention(s): Limited activity within patient's tolerance;Monitored during session;Premedicated before session    Home Living Family/patient expects to be discharged to:: Private residence Living Arrangements: Alone Available Help at Discharge: Family Type of Home: House Home Access: Stairs to enter Entrance Stairs-Rails: Right Entrance Stairs-Number of Steps: 3 Home Layout: One level Home Equipment: Environmental consultant - 2 wheels;Cane - single point;Shower seat Additional Comments: cane inside, RW going out    Prior Function Level of Independence: Independent with assistive device(s)               Hand Dominance        Extremity/Trunk Assessment   Upper Extremity Assessment Upper Extremity Assessment: Overall WFL for tasks assessed    Lower Extremity Assessment Lower Extremity Assessment: Overall WFL for tasks assessed    Cervical / Trunk Assessment Cervical / Trunk Assessment: Normal  Communication   Communication: No difficulties  Cognition Arousal/Alertness: Awake/alert Behavior During Therapy: WFL for tasks assessed/performed Overall Cognitive Status: Within Functional Limits for tasks assessed                                        General Comments      Exercises     Assessment/Plan    PT Assessment Patient needs continued PT services  PT Problem List Decreased activity tolerance       PT Treatment  Interventions Gait training;Functional mobility training;Therapeutic exercise;Balance training;Patient/family education;Therapeutic activities    PT Goals (Current goals can be found in the Care Plan section)  Acute Rehab PT Goals Patient Stated Goal: to walk without AD PT Goal Formulation: With patient Time For Goal Achievement: 09/02/16 Potential to Achieve Goals: Good    Frequency Min 3X/week   Barriers to discharge        Co-evaluation               AM-PAC PT "6 Clicks" Daily Activity  Outcome Measure Difficulty turning over in bed (including adjusting bedclothes, sheets and blankets)?: None Difficulty moving from lying on back to sitting on the side of the bed? : None Difficulty sitting down on and standing up from a chair with arms (e.g., wheelchair, bedside commode, etc,.)?: None Help needed moving to and from a bed to chair (including a wheelchair)?: None Help needed walking in hospital room?: None Help needed climbing 3-5 steps with a railing? : A Little 6 Click Score: 23    End of Session Equipment Utilized During Treatment: Gait belt Activity Tolerance: Patient tolerated treatment well Patient left: in chair;with call bell/phone within reach Nurse Communication: Mobility status PT Visit Diagnosis: Difficulty in walking, not elsewhere classified (R26.2)    Time: 5361-4431 PT Time Calculation (min) (ACUTE ONLY): 19 min   Charges:   PT Evaluation $PT Eval Low Complexity: 1 Procedure     PT G Codes:          Philomena Doheny 08/19/2016, 9:21 AM  [phone]

## 2016-08-20 LAB — BASIC METABOLIC PANEL
Anion gap: 6 (ref 5–15)
Anion gap: 8 (ref 5–15)
BUN: 10 mg/dL (ref 6–20)
BUN: 8 mg/dL (ref 6–20)
CALCIUM: 8.4 mg/dL — AB (ref 8.9–10.3)
CHLORIDE: 104 mmol/L (ref 101–111)
CO2: 27 mmol/L (ref 22–32)
CO2: 25 mmol/L (ref 22–32)
CREATININE: 0.88 mg/dL (ref 0.44–1.00)
Calcium: 8.5 mg/dL — ABNORMAL LOW (ref 8.9–10.3)
Chloride: 105 mmol/L (ref 101–111)
Creatinine, Ser: 1.03 mg/dL — ABNORMAL HIGH (ref 0.44–1.00)
GFR calc Af Amer: >60 mL/min (ref >60)
GFR calc Af Amer: >60 mL/min (ref >60)
GFR calc non Af Amer: >60 mL/min (ref >60)
GFR calc non Af Amer: >60 mL/min (ref >60)
GLUCOSE: 226 mg/dL — AB (ref 65–99)
GLUCOSE: 230 mg/dL — AB (ref 65–99)
POTASSIUM: 3.1 mmol/L — AB (ref 3.5–5.1)
Potassium: 2.8 mmol/L — ABNORMAL LOW (ref 3.5–5.1)
Sodium: 138 mmol/L (ref 135–145)
Sodium: 137 mmol/L (ref 135–145)

## 2016-08-20 LAB — GLUCOSE, CAPILLARY
GLUCOSE-CAPILLARY: 294 mg/dL — AB (ref 65–99)
Glucose-Capillary: 204 mg/dL — ABNORMAL HIGH (ref 65–99)
Glucose-Capillary: 173 mg/dL — ABNORMAL HIGH (ref 65–99)
Glucose-Capillary: 247 mg/dL — ABNORMAL HIGH (ref 65–99)

## 2016-08-20 LAB — MAGNESIUM: Magnesium: 1.3 mg/dL — ABNORMAL LOW (ref 1.7–2.4)

## 2016-08-20 MED ORDER — MORPHINE SULFATE (PF) 2 MG/ML IV SOLN
1.0000 mg | INTRAVENOUS | Status: DC | PRN
  Administered 2016-08-20 – 2016-08-21 (×3): 1 mg via INTRAVENOUS
  Filled 2016-08-20 (×3): qty 1

## 2016-08-20 MED ORDER — INSULIN ASPART 100 UNIT/ML ~~LOC~~ SOLN
0.0000 [IU] | Freq: Three times a day (TID) | SUBCUTANEOUS | Status: DC
  Administered 2016-08-20: 4 [IU] via SUBCUTANEOUS
  Administered 2016-08-20 – 2016-08-21 (×2): 7 [IU] via SUBCUTANEOUS
  Administered 2016-08-21: 15 [IU] via SUBCUTANEOUS
  Administered 2016-08-21: 3 [IU] via SUBCUTANEOUS
  Administered 2016-08-22 (×2): 7 [IU] via SUBCUTANEOUS
  Administered 2016-08-22: 4 [IU] via SUBCUTANEOUS
  Administered 2016-08-23: 7 [IU] via SUBCUTANEOUS

## 2016-08-20 MED ORDER — INSULIN GLARGINE 100 UNIT/ML ~~LOC~~ SOLN
30.0000 [IU] | Freq: Every day | SUBCUTANEOUS | Status: DC
  Administered 2016-08-20: 30 [IU] via SUBCUTANEOUS
  Filled 2016-08-20: qty 0.3

## 2016-08-20 MED ORDER — MAGNESIUM SULFATE 4 GM/100ML IV SOLN
4.0000 g | Freq: Once | INTRAVENOUS | Status: AC
  Administered 2016-08-20: 4 g via INTRAVENOUS
  Filled 2016-08-20: qty 100

## 2016-08-20 MED ORDER — INSULIN ASPART 100 UNIT/ML ~~LOC~~ SOLN
0.0000 [IU] | Freq: Every day | SUBCUTANEOUS | Status: DC
  Administered 2016-08-20 – 2016-08-22 (×3): 3 [IU] via SUBCUTANEOUS

## 2016-08-20 MED ORDER — POTASSIUM CHLORIDE 10 MEQ/100ML IV SOLN
10.0000 meq | INTRAVENOUS | Status: AC
  Administered 2016-08-20 (×4): 10 meq via INTRAVENOUS
  Filled 2016-08-20 (×4): qty 100

## 2016-08-20 NOTE — Progress Notes (Signed)
PROGRESS NOTE    Deborah [name]  ZOX:[id number] DOB: Oct 25, 1965 DOA: 08/15/2016 PCP: Nolon Bussing, PA    Brief Narrative:  51 year old female who presented with abdominal pain, nausea and vomiting. Patient is known to have allergies mellitus type II, diabetic gastroparesis, peptic ulcer disease, hypertension, obesity. She returned for a second time to the emergency department within the last 3 days due to persistent abdominal pain, nausea and vomiting. Symptoms present for last 6 days prior to hospitalization. Associated with a burning sensation in her esophagus. The initial physical examination blood pressure 101/75, heart rate 110, respiratory 20, oxygen saturation 98%, mucous membranes were dry, lungs were clear to auscultation, heart S1-S2 present rhythmic, her abdomen was soft, tender in the epigastric and suprapubic regions, no lower extremity edema. Sodium 139, potassium 3.1, chloride 115, bicarbonate 18, glucose 221, bicarbonate 25, BUN 25, creatinine 1.22, white count 9.0, hemoglobin 8.5, hematocrit 25.7, platelets 349. Urinalysis with too numerous to count white cells, active glucose. Negative pregnancy test. Chest x-ray negative for infiltrates. EKG with sinus tachycardia and left axis deviation.  Recent admitted to the medical floor with intractable nausea and vomiting due to gastroparesis complicated by urinary tract infection. Diet has been advanced, with good toleration.    Assessment & Plan:   Principal Problem:   Diabetic gastroparesis ([place]) Active Problems:   Essential hypertension, benign   Uncontrolled type 2 diabetes mellitus with diabetic neuropathy, with long-term current use of insulin (HCC)   CKD (chronic kidney disease), stage II   Hypokalemia   Hypomagnesemia   Intractable nausea and vomiting   Urinary tract infection   Anemia   Gastroparesis  Cough:  --cxr  "Right perihilar and infrahilar opacity is noted concerning for pneumonia or atelectasis.  Continued radiographic follow-up is Recommended. - she was treated with cipro initially due to concern about possible uti, cipro stopped on 6/13,  levaquin started on 6/14 to cover possible aspiration pneumonia (she is allergic to pcn), plan to finish total of 7 days abx treatment    Acute on chronic gastroparesis with intractable nausea and vomiting.  --She has frequent admissions for gastroparesis, though this has not seems to affect her weight --Continue IV antiemetics, IV antiacids and IV fluids,  decrease dose of morphine, advance diet as tolerated.  --Patient reports persistent symptoms but decrease intensity. She seems to start to eat more but still inconsistent --she reports primary care physician started her on sucralfate which has helped, this is continued. She denies constipation --she reports no GI doctors in the past, she is advised to follow up with GI motility specialist at [facility].  ? Urine tract infection (present on admission).  UA showed wbc TNTC, few bacteria,  urine culture was not done, patient denies symptom, patient has remained afebrile, no leukocytosis,  She received Antibiotic therapy with ciprofloxacin for three days, now she is on levaquin for aspiration pneumonia, she report urine is clear  AKI/CKD stage 2.  Cr 1.4 on admission, cr 0.78 on 6/14 , cr 0.88 on 6/15   Hypokalemia/Hypomagnesemia.  Remain low likely due to poor oral intake, mag 1.3, k 2.8 today on 6/15 Continue Replace k/mag, she prefer to get replacement from iv, iv k and mag.   Insulin dependent T2DM.   a1c 9.7 At home with insulin 75/25, 60 units am and 50 units pm.  Long acting insulin held initially  due to inconsistent oral intake, patient seems to start eating more, restart long acting insulin slowly, continue ssi   HTN.  Blood pressure control with metoprolol,  resume amlodipine blood pressure systolic.  Oral meds intake not consistent though due to n/v  Depression. Tolerating  well duloxetine.     Morbid obesity:  Body mass index is 47.54 kg/m.     DVT prophylaxis: scd Code Status: Full  Family Communication: patient Disposition Plan: Home when able to tolerate diet   Consultants:   none  Procedures:  none  Antimicrobials:  cipro then levaquin  Subjective: overall slowly feeling better, less ab pain,  oral intake seems to continue to improved ,but inconsistent , less prn pain meds used in the last 24hrs She report walked twice yesterday  Objective: Vitals:   08/19/16 0601 08/19/16 1411 08/19/16 2035 08/20/16 0545  BP: (!) 136/57 125/75 111/60 118/63  Pulse: 88 79 87 85  Resp: 20 20 20 20   Temp: 98.2 F (36.8 C) 98.1 F (36.7 C) 97.6 F (36.4 C) 98.4 F (36.9 C)  TempSrc: Oral Oral Oral Oral  SpO2: 97% 98% 99% 93%  Weight:      Height:        Intake/Output Summary (Last 24 hours) at 08/20/16 1140 Last data filed at 08/20/16 0630  Gross per 24 hour  Intake             1200 ml  Output                0 ml  Net             1200 ml   Filed Weights   08/15/16 1916 08/16/16 0522  Weight: 128.8 kg (284 lb) 133.6 kg (294 lb 8.6 oz)    Examination:  General exam: deconditioned , obese E ENT. No pallor or icterus, oral mucosa moist.  Respiratory system: Clear to auscultation. Respiratory effort normal. No wheezing, rales or rhonchi.  Cardiovascular system: S1 & S2 heard, RRR. No JVD, murmurs, rubs, gallops or clicks. No pedal edema. Gastrointestinal system: mild tender epigastric region, Abdomen is nondistended, soft , no rebound , no guarding, Normal bowel sounds heard. Central nervous system: Alert and oriented. No focal neurological deficits. Extremities: Symmetric 5 x 5 power. Skin: No rashes, lesions or ulcers     Data Reviewed: I have personally reviewed following labs and imaging studies  CBC:  Recent Labs Lab 08/13/16 1630 08/15/16 1950 08/16/16 0651 08/17/16 0852  WBC 12.2* 10.7* 9.9 7.2  NEUTROABS   --   --  5.3 5.1  HGB 10.9* 10.4* 8.5* 9.1*  HCT 32.5* 31.6* 25.7* 27.5*  MCV 84.9 85.4 85.1 85.9  PLT 421* 406* 349 671   Basic Metabolic Panel:  Recent Labs Lab 08/15/16 1950 08/16/16 0651 08/17/16 0852 08/18/16 0758 08/19/16 0739 08/20/16 0612  NA 136 139 137  --  138 138  K 3.2* 3.1* 3.7  --  3.0* 2.8*  CL 109 115* 112*  --  106 105  CO2 17* 18* 19*  --  23 27  GLUCOSE 305* 221* 255*  --  198* 226*  BUN 25* 25* 12  --  10 10  CREATININE 1.42* 1.22* 0.96  --  0.78 0.88  CALCIUM 9.1 8.1* 8.1*  --  8.5* 8.4*  MG 1.2*  --   --  1.3* 1.3* 1.3*  PHOS 3.3  --   --   --   --   --    GFR: Estimated Creatinine Clearance: 105.1 mL/min (by C-G formula based on SCr of 0.88 mg/dL). Liver Function Tests:  Recent Labs Lab 08/13/16  1630 08/15/16 1950  AST 20 21  ALT 24 19  ALKPHOS 109 109  BILITOT 0.8 0.5  PROT 8.4* 7.8  ALBUMIN 3.7 3.6    Recent Labs Lab 08/13/16 1630 08/15/16 1950  LIPASE 33 30   No results for input(s): AMMONIA in the last 168 hours. Coagulation Profile: No results for input(s): INR, PROTIME in the last 168 hours. Cardiac Enzymes: No results for input(s): CKTOTAL, CKMB, CKMBINDEX, TROPONINI in the last 168 hours. BNP (last 3 results) No results for input(s): PROBNP in the last 8760 hours. HbA1C: No results for input(s): HGBA1C in the last 72 hours. CBG:  Recent Labs Lab 08/19/16 0529 08/19/16 1223 08/19/16 1751 08/19/16 2351 08/20/16 0615  GLUCAP 208* 194* 275* 243* 204*   Lipid Profile: No results for input(s): CHOL, HDL, LDLCALC, TRIG, CHOLHDL, LDLDIRECT in the last 72 hours. Thyroid Function Tests: No results for input(s): TSH, T4TOTAL, FREET4, T3FREE, THYROIDAB in the last 72 hours. Anemia Panel: No results for input(s): VITAMINB12, FOLATE, FERRITIN, TIBC, IRON, RETICCTPCT in the last 72 hours. Sepsis Labs:  Recent Labs Lab 08/15/16 2242 08/16/16 0057  LATICACIDVEN 2.23* 1.89    No results found for this or any previous  visit (from the past 240 hour(s)).       Radiology Studies: Dg Chest Port 1 View  Result Date: 08/19/2016 CLINICAL DATA:  Cough. EXAM: PORTABLE CHEST 1 VIEW COMPARISON:  Radiographs of August 15, 2016. FINDINGS: Normal cardiac silhouette is noted. No pneumothorax or pleural effusion is noted. Right perihilar and infrahilar opacity is noted which is increased compared to prior exam concerning for pneumonia or atelectasis. Left lung is clear. The visualized skeletal structures are unremarkable. IMPRESSION: Right perihilar and infrahilar opacity is noted concerning for pneumonia or atelectasis. Continued radiographic follow-up is recommended. Electronically Signed   By: Marijo Conception, M.D.   On: 08/19/2016 10:41        Scheduled Meds: . allopurinol  100 mg Oral Daily  . amLODipine  10 mg Oral Daily  . atorvastatin  20 mg Oral QHS  . DULoxetine  30 mg Oral BID  . insulin aspart  0-20 Units Subcutaneous TID WC  . insulin aspart  0-5 Units Subcutaneous QHS  . insulin glargine  30 Units Subcutaneous QHS  . metoCLOPramide (REGLAN) injection  10 mg Intravenous Q6H  . metoprolol succinate  25 mg Oral Daily  . pantoprazole (PROTONIX) IV  40 mg Intravenous Q12H  . sucralfate  1 g Oral TID WC & HS   Continuous Infusions: . levofloxacin (LEVAQUIN) IV Stopped (08/19/16 1700)  . potassium chloride Stopped (08/20/16 1116)     LOS: 4 days     Time > 2mins  Charlesia Canaday, MD PhD [facility] Pager [phone]  If 7PM-7AM, please contact night-coverage [email or web address] Password [email or web address] 08/20/2016, 11:40 AM

## 2016-08-20 NOTE — Progress Notes (Addendum)
Inpatient Diabetes Program Recommendations  AACE/ADA: New Consensus Statement on Inpatient Glycemic Control (2015)  Target Ranges:  Prepandial:   less than 140 mg/dL      Peak postprandial:   less than 180 mg/dL (1-2 hours)      Critically ill patients:  140 - 180 mg/dL   Results for Deborah [name], Deborah [name] (MRN [id number]) as of 08/20/2016 08:33  Ref. Range 08/19/2016 03:24 08/19/2016 05:29 08/19/2016 12:23 08/19/2016 17:51  Glucose-Capillary Latest Ref Range: 65 - 99 mg/dL 201 (H) 208 (H) 194 (H) 275 (H)   Results for Deborah [name], Deborah [name] (MRN [id number]) as of 08/20/2016 08:33  Ref. Range 08/19/2016 23:51 08/20/2016 06:15  Glucose-Capillary Latest Ref Range: 65 - 99 mg/dL 243 (H) 204 (H)    Outpatient DM meds:70/30 Insulin: 75 units breakfast, 60 units lunch, 50 units supper  Current Insulin Orders: Novolog Sensitive Correction Scale/ SSI (0-9 units) Q6 hours      Lantus 25 units QHS     Patient takes 70/30 Insulin at home.   Glucose trends increasing into the 200's.   MD- Please consider the following in-hospital insulin adjustments:  1. Increase Lantus to 30units QHS  2. Increase/Change Novolog SSI to Moderate scale (0-15 units) TID AC + HS (currently ordered as Sensitive scale Q6 hours)    --Will follow patient during hospitalization--  Wyn Quaker RN, MSN, CDE Diabetes Coordinator Inpatient Glycemic Control Team Team Pager: [phone] (8a-5p)

## 2016-08-21 LAB — BASIC METABOLIC PANEL
Anion gap: 5 (ref 5–15)
BUN: 13 mg/dL (ref 6–20)
CHLORIDE: 105 mmol/L (ref 101–111)
CO2: 27 mmol/L (ref 22–32)
CREATININE: 1.06 mg/dL — AB (ref 0.44–1.00)
Calcium: 8.5 mg/dL — ABNORMAL LOW (ref 8.9–10.3)
GFR calc Af Amer: >60 mL/min (ref >60)
GFR calc non Af Amer: 59 mL/min — ABNORMAL LOW (ref >60)
GLUCOSE: 245 mg/dL — AB (ref 65–99)
Potassium: 3 mmol/L — ABNORMAL LOW (ref 3.5–5.1)
Sodium: 137 mmol/L (ref 135–145)

## 2016-08-21 LAB — GLUCOSE, CAPILLARY
GLUCOSE-CAPILLARY: 230 mg/dL — AB (ref 65–99)
GLUCOSE-CAPILLARY: 263 mg/dL — AB (ref 65–99)
GLUCOSE-CAPILLARY: 266 mg/dL — AB (ref 65–99)
GLUCOSE-CAPILLARY: 305 mg/dL — AB (ref 65–99)
Glucose-Capillary: 219 mg/dL — ABNORMAL HIGH (ref 65–99)
Glucose-Capillary: 133 mg/dL — ABNORMAL HIGH (ref 65–99)

## 2016-08-21 LAB — MAGNESIUM: Magnesium: 1.3 mg/dL — ABNORMAL LOW (ref 1.7–2.4)

## 2016-08-21 MED ORDER — POTASSIUM CHLORIDE CRYS ER 20 MEQ PO TBCR
40.0000 meq | EXTENDED_RELEASE_TABLET | ORAL | Status: AC
Start: 2016-08-21 — End: 2016-08-21
  Administered 2016-08-21 (×2): 40 meq via ORAL
  Filled 2016-08-21 (×2): qty 2

## 2016-08-21 MED ORDER — MORPHINE SULFATE (PF) 2 MG/ML IV SOLN
1.0000 mg | Freq: Four times a day (QID) | INTRAVENOUS | Status: DC | PRN
  Administered 2016-08-21 – 2016-08-22 (×2): 1 mg via INTRAVENOUS
  Filled 2016-08-21: qty 1
  Filled 2016-08-21: qty 0.5

## 2016-08-21 MED ORDER — MAGNESIUM SULFATE 4 GM/100ML IV SOLN
4.0000 g | Freq: Once | INTRAVENOUS | Status: AC
  Administered 2016-08-21: 4 g via INTRAVENOUS
  Filled 2016-08-21: qty 100

## 2016-08-21 MED ORDER — INSULIN GLARGINE 100 UNIT/ML ~~LOC~~ SOLN
35.0000 [IU] | Freq: Every day | SUBCUTANEOUS | Status: DC
  Administered 2016-08-21: 35 [IU] via SUBCUTANEOUS
  Filled 2016-08-21: qty 0.35

## 2016-08-21 MED ORDER — GUAIFENESIN ER 600 MG PO TB12
600.0000 mg | ORAL_TABLET | Freq: Two times a day (BID) | ORAL | Status: DC
  Administered 2016-08-21 – 2016-08-23 (×5): 600 mg via ORAL
  Filled 2016-08-21 (×5): qty 1

## 2016-08-21 MED ORDER — SENNOSIDES-DOCUSATE SODIUM 8.6-50 MG PO TABS
1.0000 | ORAL_TABLET | Freq: Two times a day (BID) | ORAL | Status: DC
  Administered 2016-08-21 (×2): 1 via ORAL
  Filled 2016-08-21 (×2): qty 1

## 2016-08-21 NOTE — Progress Notes (Signed)
PROGRESS NOTE    Deborah [name]  VHQ:[id number] DOB: [birth year] DOA: 08/15/2016 PCP: Nolon Bussing, PA    Brief Narrative:  51 year old female who presented with abdominal pain, nausea and vomiting. Patient is known to have allergies mellitus type II, diabetic gastroparesis, peptic ulcer disease, hypertension, obesity. She returned for a second time to the emergency department within the last 3 days due to persistent abdominal pain, nausea and vomiting. Symptoms present for last 6 days prior to hospitalization. Associated with a burning sensation in her esophagus. The initial physical examination blood pressure 101/75, heart rate 110, respiratory 20, oxygen saturation 98%, mucous membranes were dry, lungs were clear to auscultation, heart S1-S2 present rhythmic, her abdomen was soft, tender in the epigastric and suprapubic regions, no lower extremity edema. Sodium 139, potassium 3.1, chloride 115, bicarbonate 18, glucose 221, bicarbonate 25, BUN 25, creatinine 1.22, white count 9.0, hemoglobin 8.5, hematocrit 25.7, platelets 349. Urinalysis with too numerous to count white cells, active glucose. Negative pregnancy test. Chest x-ray negative for infiltrates. EKG with sinus tachycardia and left axis deviation.  Recent admitted to the medical floor with intractable nausea and vomiting due to gastroparesis complicated by urinary tract infection. Diet has been advanced, with good toleration.    Assessment & Plan:   Principal Problem:   Diabetic gastroparesis ([place]) Active Problems:   Essential hypertension, benign   Uncontrolled type 2 diabetes mellitus with diabetic neuropathy, with long-term current use of insulin (HCC)   CKD (chronic kidney disease), stage II   Hypokalemia   Hypomagnesemia   Intractable nausea and vomiting   Urinary tract infection   Anemia   Gastroparesis  Cough:  --cxr  "Right perihilar and infrahilar opacity is noted concerning for pneumonia or atelectasis.  Continued radiographic follow-up is Recommended. - she was treated with cipro initially due to concern about possible uti, cipro stopped on 6/13,  levaquin started on 6/14 to cover possible aspiration pneumonia (she is allergic to pcn), plan to finish total of 7 days abx treatment    Acute on chronic gastroparesis with intractable nausea and vomiting.  --She has frequent admissions for gastroparesis, though this has not seems to affect her weight --Continue IV antiemetics, IV antiacids and IV fluids,  decrease dose of morphine, advance diet as tolerated.  --Patient reports persistent symptoms but decrease intensity. She seems to start to eat more but still inconsistent --she reports primary care physician started her on sucralfate which has helped, this is continued. She denies constipation --she reports no GI doctors in the past, she is advised to follow up with GI motility specialist at [facility].  ? Urine tract infection (present on admission).  UA showed wbc TNTC, few bacteria,  urine culture was not done, patient denies symptom, patient has remained afebrile, no leukocytosis,  She received Antibiotic therapy with ciprofloxacin for three days, now she is on levaquin for aspiration pneumonia, she report urine is clear  AKI/CKD stage 2.  Cr 1.4 on admission, cr 0.78 on 6/14 , cr 0.88 on 6/15   Hypokalemia/Hypomagnesemia.  Remain low likely due to poor oral intake, mag 1.3, k 3.0 today on 6/16 Continue Replace k/mag, she prefer to get replacement from iv, iv k and mag.   Insulin dependent T2DM.   a1c 9.7 At home with insulin 75/25, 60 units am and 50 units pm.  Long acting insulin held initially  due to inconsistent oral intake,  patient seems to start eating more, am blood sugar elevated, continue to increase  long acting  insulin slowly, continue ssi   HTN.  Blood pressure control with metoprolol,  resume amlodipine blood pressure systolic.  Oral meds intake not consistent though  due to n/v  H/o cva: stable, continue home meds  Depression. Tolerating well duloxetine.     Morbid obesity:  Body mass index is 47.54 kg/m.     DVT prophylaxis: scd Code Status: Full  Family Communication: patient Disposition Plan: Home when able to tolerate diet   Consultants:   none  Procedures:  none  Antimicrobials:  cipro then levaquin  Subjective: overall slowly feeling better, less prn pain meds used in the last 24hrs, she want diet advanced from soft diet to carb modified diet She report still cough  Objective: Vitals:   08/19/16 2035 08/20/16 0545 08/20/16 2132 08/21/16 0535  BP: 111/60 118/63 112/61 (!) 137/93  Pulse: 87 85 93 84  Resp: 20 20 18 20   Temp: 97.6 F (36.4 C) 98.4 F (36.9 C) 98.3 F (36.8 C) 99 F (37.2 C)  TempSrc: Oral Oral Oral Oral  SpO2: 99% 93% 97% 94%  Weight:      Height:        Intake/Output Summary (Last 24 hours) at 08/21/16 0751 Last data filed at 08/20/16 1505  Gross per 24 hour  Intake              940 ml  Output                0 ml  Net              940 ml   Filed Weights   08/15/16 1916 08/16/16 0522  Weight: 128.8 kg (284 lb) 133.6 kg (294 lb 8.6 oz)    Examination:  General exam: NAD, obese E ENT. No pallor or icterus, oral mucosa moist.  Respiratory system: Clear to auscultation. Respiratory effort normal. No wheezing, rales or rhonchi.  Cardiovascular system: S1 & S2 heard, RRR. No JVD, murmurs, rubs, gallops or clicks. No pedal edema. Gastrointestinal system: mild tender epigastric region, Abdomen is nondistended, soft , no rebound , no guarding, Normal bowel sounds heard. Central nervous system: Alert and oriented. No focal neurological deficits. Extremities: Symmetric 5 x 5 power. Skin: No rashes, lesions or ulcers     Data Reviewed: I have personally reviewed following labs and imaging studies  CBC:  Recent Labs Lab 08/15/16 1950 08/16/16 0651 08/17/16 0852  WBC 10.7* 9.9  7.2  NEUTROABS  --  5.3 5.1  HGB 10.4* 8.5* 9.1*  HCT 31.6* 25.7* 27.5*  MCV 85.4 85.1 85.9  PLT 406* 349 093   Basic Metabolic Panel:  Recent Labs Lab 08/15/16 1950  08/17/16 0852 08/18/16 0758 08/19/16 0739 08/20/16 0612 08/20/16 1359 08/21/16 0513  NA 136  < > 137  --  138 138 137 137  K 3.2*  < > 3.7  --  3.0* 2.8* 3.1* 3.0*  CL 109  < > 112*  --  106 105 104 105  CO2 17*  < > 19*  --  23 27 25 27   GLUCOSE 305*  < > 255*  --  198* 226* 230* 245*  BUN 25*  < > 12  --  10 10 8 13   CREATININE 1.42*  < > 0.96  --  0.78 0.88 1.03* 1.06*  CALCIUM 9.1  < > 8.1*  --  8.5* 8.4* 8.5* 8.5*  MG 1.2*  --   --  1.3* 1.3* 1.3*  --  1.3*  PHOS  3.3  --   --   --   --   --   --   --   < > = values in this interval not displayed. GFR: Estimated Creatinine Clearance: 87.2 mL/min (A) (by C-G formula based on SCr of 1.06 mg/dL (H)). Liver Function Tests:  Recent Labs Lab 08/15/16 1950  AST 21  ALT 19  ALKPHOS 109  BILITOT 0.5  PROT 7.8  ALBUMIN 3.6    Recent Labs Lab 08/15/16 1950  LIPASE 30   No results for input(s): AMMONIA in the last 168 hours. Coagulation Profile: No results for input(s): INR, PROTIME in the last 168 hours. Cardiac Enzymes: No results for input(s): CKTOTAL, CKMB, CKMBINDEX, TROPONINI in the last 168 hours. BNP (last 3 results) No results for input(s): PROBNP in the last 8760 hours. HbA1C: No results for input(s): HGBA1C in the last 72 hours. CBG:  Recent Labs Lab 08/20/16 0615 08/20/16 1201 08/20/16 1657 08/20/16 2128 08/21/16 0529  GLUCAP 204* 173* 247* 294* 263*   Lipid Profile: No results for input(s): CHOL, HDL, LDLCALC, TRIG, CHOLHDL, LDLDIRECT in the last 72 hours. Thyroid Function Tests: No results for input(s): TSH, T4TOTAL, FREET4, T3FREE, THYROIDAB in the last 72 hours. Anemia Panel: No results for input(s): VITAMINB12, FOLATE, FERRITIN, TIBC, IRON, RETICCTPCT in the last 72 hours. Sepsis Labs:  Recent Labs Lab 08/15/16 2242  08/16/16 0057  LATICACIDVEN 2.23* 1.89    No results found for this or any previous visit (from the past 240 hour(s)).       Radiology Studies: Dg Chest Port 1 View  Result Date: 08/19/2016 CLINICAL DATA:  Cough. EXAM: PORTABLE CHEST 1 VIEW COMPARISON:  Radiographs of August 15, 2016. FINDINGS: Normal cardiac silhouette is noted. No pneumothorax or pleural effusion is noted. Right perihilar and infrahilar opacity is noted which is increased compared to prior exam concerning for pneumonia or atelectasis. Left lung is clear. The visualized skeletal structures are unremarkable. IMPRESSION: Right perihilar and infrahilar opacity is noted concerning for pneumonia or atelectasis. Continued radiographic follow-up is recommended. Electronically Signed   By: Marijo Conception, M.D.   On: 08/19/2016 10:41        Scheduled Meds: . allopurinol  100 mg Oral Daily  . amLODipine  10 mg Oral Daily  . atorvastatin  20 mg Oral QHS  . DULoxetine  30 mg Oral BID  . insulin aspart  0-20 Units Subcutaneous TID WC  . insulin aspart  0-5 Units Subcutaneous QHS  . insulin glargine  35 Units Subcutaneous QHS  . metoCLOPramide (REGLAN) injection  10 mg Intravenous Q6H  . metoprolol succinate  25 mg Oral Daily  . pantoprazole (PROTONIX) IV  40 mg Intravenous Q12H  . sucralfate  1 g Oral TID WC & HS   Continuous Infusions: . levofloxacin (LEVAQUIN) IV Stopped (08/20/16 1626)  . magnesium sulfate 1 - 4 g bolus IVPB       LOS: 5 days     Time > 20mins  Lavida Patch, MD PhD [facility] Pager [phone]  If 7PM-7AM, please contact night-coverage [email or web address] Password [facility] 08/21/2016, 7:51 AM

## 2016-08-22 ENCOUNTER — Inpatient Hospital Stay (HOSPITAL_COMMUNITY): Payer: Self-pay

## 2016-08-22 LAB — CBC WITH DIFFERENTIAL/PLATELET
BASOS ABS: 0 10*3/uL (ref 0.0–0.1)
BASOS PCT: 0 %
EOS PCT: 3 %
Eosinophils Absolute: 0.3 10*3/uL (ref 0.0–0.7)
HCT: 25.7 % — ABNORMAL LOW (ref 36.0–46.0)
HEMOGLOBIN: 8.5 g/dL — AB (ref 12.0–15.0)
Lymphocytes Relative: 32 %
Lymphs Abs: 3 10*3/uL (ref 0.7–4.0)
MCH: 28.4 pg (ref 26.0–34.0)
MCHC: 33.1 g/dL (ref 30.0–36.0)
MCV: 86 fL (ref 78.0–100.0)
Monocytes Absolute: 0.5 10*3/uL (ref 0.1–1.0)
Monocytes Relative: 5 %
NEUTROS PCT: 60 %
Neutro Abs: 5.5 10*3/uL (ref 1.7–7.7)
PLATELETS: 310 10*3/uL (ref 150–400)
RBC: 2.99 MIL/uL — AB (ref 3.87–5.11)
RDW: 14.7 % (ref 11.5–15.5)
WBC: 9.2 10*3/uL (ref 4.0–10.5)

## 2016-08-22 LAB — BASIC METABOLIC PANEL
ANION GAP: 6 (ref 5–15)
BUN: 17 mg/dL (ref 6–20)
CALCIUM: 8.4 mg/dL — AB (ref 8.9–10.3)
CO2: 25 mmol/L (ref 22–32)
CREATININE: 0.96 mg/dL (ref 0.44–1.00)
Chloride: 106 mmol/L (ref 101–111)
GFR calc non Af Amer: >60 mL/min (ref >60)
Glucose, Bld: 231 mg/dL — ABNORMAL HIGH (ref 65–99)
Potassium: 3.2 mmol/L — ABNORMAL LOW (ref 3.5–5.1)
SODIUM: 137 mmol/L (ref 135–145)

## 2016-08-22 LAB — GLUCOSE, CAPILLARY
GLUCOSE-CAPILLARY: 242 mg/dL — AB (ref 65–99)
GLUCOSE-CAPILLARY: 200 mg/dL — AB (ref 65–99)
GLUCOSE-CAPILLARY: 298 mg/dL — AB (ref 65–99)
Glucose-Capillary: 222 mg/dL — ABNORMAL HIGH (ref 65–99)
Glucose-Capillary: 248 mg/dL — ABNORMAL HIGH (ref 65–99)

## 2016-08-22 LAB — MAGNESIUM: MAGNESIUM: 1.6 mg/dL — AB (ref 1.7–2.4)

## 2016-08-22 MED ORDER — MAGNESIUM SULFATE 2 GM/50ML IV SOLN
2.0000 g | Freq: Once | INTRAVENOUS | Status: AC
Start: 2016-08-22 — End: 2016-08-22
  Administered 2016-08-22: 2 g via INTRAVENOUS
  Filled 2016-08-22: qty 50

## 2016-08-22 MED ORDER — INSULIN GLARGINE 100 UNIT/ML ~~LOC~~ SOLN
40.0000 [IU] | Freq: Every day | SUBCUTANEOUS | Status: DC
  Administered 2016-08-22: 40 [IU] via SUBCUTANEOUS
  Filled 2016-08-22: qty 0.4

## 2016-08-22 MED ORDER — PANTOPRAZOLE SODIUM 40 MG PO TBEC
40.0000 mg | DELAYED_RELEASE_TABLET | Freq: Two times a day (BID) | ORAL | Status: DC
Start: 2016-08-22 — End: 2016-08-23
  Administered 2016-08-22 – 2016-08-23 (×3): 40 mg via ORAL
  Filled 2016-08-22 (×3): qty 1

## 2016-08-22 MED ORDER — FUROSEMIDE 10 MG/ML IJ SOLN
40.0000 mg | Freq: Once | INTRAMUSCULAR | Status: AC
  Administered 2016-08-22: 40 mg via INTRAVENOUS
  Filled 2016-08-22: qty 4

## 2016-08-22 MED ORDER — MORPHINE SULFATE (PF) 4 MG/ML IV SOLN
1.0000 mg | Freq: Four times a day (QID) | INTRAVENOUS | Status: DC | PRN
  Administered 2016-08-22 (×2): 1 mg via INTRAVENOUS
  Filled 2016-08-22 (×2): qty 1

## 2016-08-22 MED ORDER — LEVOFLOXACIN 750 MG PO TABS
750.0000 mg | ORAL_TABLET | Freq: Every day | ORAL | Status: DC
  Administered 2016-08-22: 750 mg via ORAL
  Filled 2016-08-22: qty 1

## 2016-08-22 MED ORDER — BENZONATATE 100 MG PO CAPS
100.0000 mg | ORAL_CAPSULE | Freq: Three times a day (TID) | ORAL | Status: DC | PRN
  Administered 2016-08-22 – 2016-08-23 (×2): 100 mg via ORAL
  Filled 2016-08-22 (×2): qty 1

## 2016-08-22 MED ORDER — POTASSIUM CHLORIDE CRYS ER 20 MEQ PO TBCR
40.0000 meq | EXTENDED_RELEASE_TABLET | ORAL | Status: AC
  Administered 2016-08-22 (×2): 40 meq via ORAL
  Filled 2016-08-22 (×2): qty 2

## 2016-08-22 MED ORDER — MORPHINE SULFATE (PF) 4 MG/ML IV SOLN
INTRAVENOUS | Status: AC
  Filled 2016-08-22: qty 1

## 2016-08-22 NOTE — Progress Notes (Signed)
The patient is receiving Protonix by the intravenous route.  Based on criteria approved by the Pharmacy and [place], the medication is being converted to the equivalent oral dose form.  These criteria include: -No active GI bleeding -Able to tolerate diet of full liquids (or better) or tube feeding -Able to tolerate other medications by the oral or enteral route  If you have any questions about this conversion, please contact the Pharmacy Department (phone [phone]).  Thank you. Eudelia Bunch, Pharm.D. [phone] 08/22/2016 9:50 AM

## 2016-08-22 NOTE — Progress Notes (Signed)
PROGRESS NOTE    Deborah [name]  ZDG:[id number] DOB: Dec 04, 1965 DOA: 08/15/2016 PCP: Nolon Bussing, PA    Brief Narrative:  51 year old female who presented with abdominal pain, nausea and vomiting. Patient is known to have allergies mellitus type II, diabetic gastroparesis, peptic ulcer disease, hypertension, obesity. She returned for a second time to the emergency department within the last 3 days due to persistent abdominal pain, nausea and vomiting. Symptoms present for last 6 days prior to hospitalization. Associated with a burning sensation in her esophagus. The initial physical examination blood pressure 101/75, heart rate 110, respiratory 20, oxygen saturation 98%, mucous membranes were dry, lungs were clear to auscultation, heart S1-S2 present rhythmic, her abdomen was soft, tender in the epigastric and suprapubic regions, no lower extremity edema. Sodium 139, potassium 3.1, chloride 115, bicarbonate 18, glucose 221, bicarbonate 25, BUN 25, creatinine 1.22, white count 9.0, hemoglobin 8.5, hematocrit 25.7, platelets 349. Urinalysis with too numerous to count white cells, active glucose. Negative pregnancy test. Chest x-ray negative for infiltrates. EKG with sinus tachycardia and left axis deviation.  Recent admitted to the medical floor with intractable nausea and vomiting due to gastroparesis complicated by urinary tract infection. Diet has been advanced, with good toleration.    Assessment & Plan:   Principal Problem:   Diabetic gastroparesis ([place]) Active Problems:   Essential hypertension, benign   Uncontrolled type 2 diabetes mellitus with diabetic neuropathy, with long-term current use of insulin (HCC)   CKD (chronic kidney disease), stage II   Hypokalemia   Hypomagnesemia   Intractable nausea and vomiting   Urinary tract infection   Anemia   Gastroparesis  Cough:  --cxr  "Right perihilar and infrahilar opacity is noted concerning for pneumonia or atelectasis.  Continued radiographic follow-up is Recommended. - she was treated with cipro initially due to concern about possible uti, cipro stopped on 6/13,  levaquin started on 6/14 to cover possible aspiration pneumonia (she is allergic to pcn), plan to finish total of 7 days abx treatment -persistent cough on 6/17 cxr two views with interstitial edema, left lower lobe edema vs infectious process,  will give lasix, continue levaquin to finish course, last dose on 6/18.    Acute on chronic gastroparesis with intractable nausea and vomiting.  --She has frequent admissions for gastroparesis, though this has not seems to affect her weight --Continue IV antiemetics, IV antiacids and IV fluids,  decrease dose of morphine, advance diet as tolerated.  --Patient reports persistent symptoms but decrease intensity. She seems to start to eat more but still inconsistent --she reports primary care physician started her on sucralfate which has helped, this is continued. She denies constipation --she reports no GI doctors in the past, she is advised to follow up with GI motility specialist at [facility].  ? Urine tract infection (present on admission).  UA showed wbc TNTC, few bacteria,  urine culture was not done, patient denies symptom, patient has remained afebrile, no leukocytosis,  She received Antibiotic therapy with ciprofloxacin for three days, now she is on levaquin for aspiration pneumonia, she report urine is clear  AKI/CKD stage 2.  Cr 1.4 on admission, cr 0.78 on 6/14 , cr 0.96 today on 6/17   Hypokalemia/Hypomagnesemia.  mag 1.6, k 3.2 today on 6/16 Continue Replace k/mag, repeat in am  Insulin dependent T2DM.   a1c 9.7 At home with insulin 75/25, 60 units am and 50 units pm.  Long acting insulin held initially  due to inconsistent oral intake,  patient seems  to start eating more, am blood sugar elevated today 231, continue to increase  long acting insulin slowly, currently on lantus 40units  qhs, continue ssi   HTN.  Blood pressure control with metoprolol,  resume amlodipine blood pressure systolic.  Oral meds intake not consistent though due to n/v  H/o cva: stable, continue home meds  Depression. Tolerating well duloxetine.     Morbid obesity:  Body mass index is 47.54 kg/m.     DVT prophylaxis: scd Code Status: Full  Family Communication: patient Disposition Plan: Home when able to tolerate diet   Consultants:   none  Procedures:  none  Antimicrobials:  cipro then levaquin  Subjective: Report cough more, stool is loose, she does not want softener overall slowly feeling better, less prn pain meds used in the last 24hrs, now on carb modified diet   Objective: Vitals:   08/21/16 0535 08/21/16 1305 08/21/16 2011 08/22/16 0433  BP: (!) 137/93 (!) 117/59 120/79 122/60  Pulse: 84 70 82 84  Resp: 20 18 18 20   Temp: 99 F (37.2 C) 98.1 F (36.7 C) 98 F (36.7 C) 97.8 F (36.6 C)  TempSrc: Oral Oral Oral Oral  SpO2: 94% 96% 98% 96%  Weight:      Height:        Intake/Output Summary (Last 24 hours) at 08/22/16 0926 Last data filed at 08/21/16 1939  Gross per 24 hour  Intake              480 ml  Output                0 ml  Net              480 ml   Filed Weights   08/15/16 1916 08/16/16 0522  Weight: 128.8 kg (284 lb) 133.6 kg (294 lb 8.6 oz)    Examination:  General exam: NAD, obese E ENT. No pallor or icterus, oral mucosa moist.  Respiratory system: Clear to auscultation. Respiratory effort normal. No wheezing, rales or rhonchi.  Cardiovascular system: S1 & S2 heard, RRR. No JVD, murmurs, rubs, gallops or clicks. No pedal edema. Gastrointestinal system: mild tender epigastric region, Abdomen is nondistended, soft , no rebound , no guarding, Normal bowel sounds heard. Central nervous system: Alert and oriented. No focal neurological deficits. Extremities: Symmetric 5 x 5 power. Skin: No rashes, lesions or ulcers     Data  Reviewed: I have personally reviewed following labs and imaging studies  CBC:  Recent Labs Lab 08/15/16 1950 08/16/16 0651 08/17/16 0852 08/22/16 0702  WBC 10.7* 9.9 7.2 9.2  NEUTROABS  --  5.3 5.1 5.5  HGB 10.4* 8.5* 9.1* 8.5*  HCT 31.6* 25.7* 27.5* 25.7*  MCV 85.4 85.1 85.9 86.0  PLT 406* 349 335 001   Basic Metabolic Panel:  Recent Labs Lab 08/15/16 1950  08/18/16 0758 08/19/16 0739 08/20/16 0612 08/20/16 1359 08/21/16 0513 08/22/16 0702  NA 136  < >  --  138 138 137 137 137  K 3.2*  < >  --  3.0* 2.8* 3.1* 3.0* 3.2*  CL 109  < >  --  106 105 104 105 106  CO2 17*  < >  --  23 27 25 27 25   GLUCOSE 305*  < >  --  198* 226* 230* 245* 231*  BUN 25*  < >  --  10 10 8 13 17   CREATININE 1.42*  < >  --  0.78 0.88 1.03* 1.06* 0.96  CALCIUM 9.1  < >  --  8.5* 8.4* 8.5* 8.5* 8.4*  MG 1.2*  --  1.3* 1.3* 1.3*  --  1.3* 1.6*  PHOS 3.3  --   --   --   --   --   --   --   < > = values in this interval not displayed. GFR: Estimated Creatinine Clearance: 96.3 mL/min (by C-G formula based on SCr of 0.96 mg/dL). Liver Function Tests:  Recent Labs Lab 08/15/16 1950  AST 21  ALT 19  ALKPHOS 109  BILITOT 0.5  PROT 7.8  ALBUMIN 3.6    Recent Labs Lab 08/15/16 1950  LIPASE 30   No results for input(s): AMMONIA in the last 168 hours. Coagulation Profile: No results for input(s): INR, PROTIME in the last 168 hours. Cardiac Enzymes: No results for input(s): CKTOTAL, CKMB, CKMBINDEX, TROPONINI in the last 168 hours. BNP (last 3 results) No results for input(s): PROBNP in the last 8760 hours. HbA1C: No results for input(s): HGBA1C in the last 72 hours. CBG:  Recent Labs Lab 08/21/16 1710 08/21/16 2210 08/21/16 2358 08/22/16 0457 08/22/16 0712  GLUCAP 305* 266* 230* 242* 222*   Lipid Profile: No results for input(s): CHOL, HDL, LDLCALC, TRIG, CHOLHDL, LDLDIRECT in the last 72 hours. Thyroid Function Tests: No results for input(s): TSH, T4TOTAL, FREET4, T3FREE,  THYROIDAB in the last 72 hours. Anemia Panel: No results for input(s): VITAMINB12, FOLATE, FERRITIN, TIBC, IRON, RETICCTPCT in the last 72 hours. Sepsis Labs:  Recent Labs Lab 08/15/16 2242 08/16/16 0057  LATICACIDVEN 2.23* 1.89    No results found for this or any previous visit (from the past 240 hour(s)).       Radiology Studies: No results found.      Scheduled Meds: . morphine      . allopurinol  100 mg Oral Daily  . amLODipine  10 mg Oral Daily  . atorvastatin  20 mg Oral QHS  . DULoxetine  30 mg Oral BID  . guaiFENesin  600 mg Oral BID  . insulin aspart  0-20 Units Subcutaneous TID WC  . insulin aspart  0-5 Units Subcutaneous QHS  . insulin glargine  40 Units Subcutaneous QHS  . metoCLOPramide (REGLAN) injection  10 mg Intravenous Q6H  . metoprolol succinate  25 mg Oral Daily  . pantoprazole (PROTONIX) IV  40 mg Intravenous Q12H  . potassium chloride  40 mEq Oral Q4H  . senna-docusate  1 tablet Oral BID  . sucralfate  1 g Oral TID WC & HS   Continuous Infusions: . levofloxacin (LEVAQUIN) IV Stopped (08/21/16 1641)  . magnesium sulfate 1 - 4 g bolus IVPB       LOS: 6 days     Time > 14mins  Letcher Schweikert, MD PhD [facility] Pager [phone]  If 7PM-7AM, please contact night-coverage [email or web address] Password [facility] 08/22/2016, 9:26 AM

## 2016-08-22 NOTE — Progress Notes (Signed)
PHARMACIST - PHYSICIAN COMMUNICATION DR:   Erlinda Hong CONCERNING: Antibiotic IV to Oral Route Change Policy  RECOMMENDATION: This patient is receiving levofloxaxcin by the intravenous route.  Based on criteria approved by the Pharmacy and Therapeutics Committee, the antibiotic(s) is/are being converted to the equivalent oral dose form(s).   DESCRIPTION: These criteria include:  Patient being treated for a respiratory tract infection, urinary tract infection, cellulitis or clostridium difficile associated diarrhea if on metronidazole  The patient is not neutropenic and does not exhibit a GI malabsorption state  The patient is eating (either orally or via tube) and/or has been taking other orally administered medications for a least 24 hours  The patient is improving clinically and has a Tmax < 100.5  If you have questions about this conversion, please contact the Pharmacy Department  []   [phone] )  [facility] []   [phone] )  [facility] []   [phone] )  [facility] []   [phone] )  [facility] [x]   [phone] )  [place], [place].D. [phone] 08/22/2016 9:50 AM

## 2016-08-23 LAB — CBC WITH DIFFERENTIAL/PLATELET
BASOS ABS: 0 10*3/uL (ref 0.0–0.1)
BASOS PCT: 0 %
Eosinophils Absolute: 0.3 10*3/uL (ref 0.0–0.7)
Eosinophils Relative: 3 %
HEMATOCRIT: 26.6 % — AB (ref 36.0–46.0)
HEMOGLOBIN: 8.8 g/dL — AB (ref 12.0–15.0)
Lymphocytes Relative: 32 %
Lymphs Abs: 3 10*3/uL (ref 0.7–4.0)
MCH: 28.5 pg (ref 26.0–34.0)
MCHC: 33.1 g/dL (ref 30.0–36.0)
MCV: 86.1 fL (ref 78.0–100.0)
MONO ABS: 0.6 10*3/uL (ref 0.1–1.0)
Monocytes Relative: 7 %
NEUTROS ABS: 5.5 10*3/uL (ref 1.7–7.7)
NEUTROS PCT: 58 %
Platelets: 326 10*3/uL (ref 150–400)
RBC: 3.09 MIL/uL — ABNORMAL LOW (ref 3.87–5.11)
RDW: 14.7 % (ref 11.5–15.5)
WBC: 9.5 10*3/uL (ref 4.0–10.5)

## 2016-08-23 LAB — GLUCOSE, CAPILLARY
GLUCOSE-CAPILLARY: 223 mg/dL — AB (ref 65–99)
Glucose-Capillary: 248 mg/dL — ABNORMAL HIGH (ref 65–99)
Glucose-Capillary: 245 mg/dL — ABNORMAL HIGH (ref 65–99)

## 2016-08-23 LAB — BASIC METABOLIC PANEL
ANION GAP: 6 (ref 5–15)
BUN: 16 mg/dL (ref 6–20)
CALCIUM: 8.6 mg/dL — AB (ref 8.9–10.3)
CO2: 28 mmol/L (ref 22–32)
Chloride: 103 mmol/L (ref 101–111)
Creatinine, Ser: 1.02 mg/dL — ABNORMAL HIGH (ref 0.44–1.00)
GFR calc non Af Amer: >60 mL/min (ref >60)
Glucose, Bld: 279 mg/dL — ABNORMAL HIGH (ref 65–99)
Potassium: 3.4 mmol/L — ABNORMAL LOW (ref 3.5–5.1)
Sodium: 137 mmol/L (ref 135–145)

## 2016-08-23 LAB — MAGNESIUM: MAGNESIUM: 1.4 mg/dL — AB (ref 1.7–2.4)

## 2016-08-23 MED ORDER — MAGNESIUM SULFATE 2 GM/50ML IV SOLN
2.0000 g | Freq: Once | INTRAVENOUS | Status: AC
  Administered 2016-08-23: 2 g via INTRAVENOUS
  Filled 2016-08-23: qty 50

## 2016-08-23 MED ORDER — METOCLOPRAMIDE HCL 10 MG PO TABS: 10.0000 mg | ORAL_TABLET | Freq: Three times a day (TID) | ORAL | 0 refills | Status: DC

## 2016-08-23 MED ORDER — MAGNESIUM OXIDE -MG SUPPLEMENT 400 (240 MG) MG PO TABS: 400.0000 mg | ORAL_TABLET | Freq: Every day | ORAL | 0 refills | Status: DC

## 2016-08-23 MED ORDER — SUCRALFATE 1 G PO TABS: 1.0000 g | ORAL_TABLET | Freq: Three times a day (TID) | ORAL | 0 refills | Status: DC

## 2016-08-23 MED ORDER — BENZONATATE 100 MG PO CAPS: 100.0000 mg | ORAL_CAPSULE | Freq: Three times a day (TID) | ORAL | 0 refills | Status: DC | PRN

## 2016-08-23 NOTE — Discharge Summary (Signed)
Discharge Summary  Deborah [name] CWC:[id number] DOB: [birth year]  PCP: Nolon Bussing, PA  Admit date: 08/15/2016 Discharge date: 08/23/2016  Time spent: <12mins  Recommendations for Outpatient Follow-up:  1. F/u with PMD within a week  for hospital discharge follow up, repeat cbc/bmp at follow up 2. F/u with GI for gastroparesis  managment  Discharge Diagnoses:  Active Hospital Problems   Diagnosis Date Noted  . Diabetic gastroparesis ([place]) 06/05/2016  . Urinary tract infection 08/16/2016  . Anemia 08/16/2016  . Gastroparesis 08/16/2016  . Intractable nausea and vomiting 06/17/2016  . Hypokalemia 09/26/2015  . Hypomagnesemia 09/26/2015  . Uncontrolled type 2 diabetes mellitus with diabetic neuropathy, with long-term current use of insulin ([place]) 05/25/2015  . CKD (chronic kidney disease), stage II 05/25/2015  . Essential hypertension, benign 09/28/2013    Resolved Hospital Problems   Diagnosis Date Noted Date Resolved  No resolved problems to display.    Discharge Condition: stable  Diet recommendation: heart healthy/carb modified  Filed Weights   08/15/16 1916 08/16/16 0522  Weight: 128.8 kg (284 lb) 133.6 kg (294 lb 8.6 oz)    History of present illness:  PCP: Nolon Bussing, PA   Patient coming from: Home.  I have personally briefly reviewed patient's old medical records in [place]  Chief Complaint: Abdominal pain, nausea and vomiting.  HPI: Deborah [name] is a 51 y.o. female with medical history significant of chest pain, diabetes mellitus, diabetic gastroparesis, PUD, gout, hyperlipidemia, hypertension, obesity, history of pneumonia, history of stroke who is returning to the emergency department for the second time in the last 3 days due to persistent abdominal pain, nausea and emesis since Tuesday. She complains of burning sensation in her esophagus. She mentions that she has frequent diarrhea as well. She denies constipation,  melena or hematochezia. She denies fever, chills, chest pain, palpitations, dizziness, diaphoresis, PND, orthopnea or pitting edema lower extremities. She complains of suprapubic pain, but denies dysuria, frequency or hematuria.  She also has had 4 hospitalizations in the past 4-5 months due to intractable nausea and vomiting. The most recent one was on 07/04/2016, when she had a CT abdomen/pelvis with contrast that showed hepatic steatosis and an exophytic uterine fibroid, but no acute intra-abdominal/pelvic pathology. She had a normal gastric emptying study in February. Last year in October she had a low risk nuclear scan and her CT chest angiogram scans have been unremarkable, other than fatty infiltration of liver.  ED Course: Initially the patient was afebrile, with a normal O2 sat and respiratory rate, but her blood pressure was low 101/75 mmHg and her heart rate was in the 110s-120s. She received 2000 ML of normal saline bolus, metoclopramide 10 mg, morphine 4 mg, ondansetron 4 mg IVP and a GI cocktail, experiencing some relief. Her blood pressure increased and her tachycardia subsided. However, she failed the oral tolerance test, her abdominal pain, nausea and emesis recurred.  Lab work and EKG: Urinary analysis showed a cloudy color, a small hemoglobin, mild proteinuria, negative ketones, positive leukocyte esterase with RBC 6-30, urinary WBC where TNTC, there were a few bacteria. CBC showed a white count of 10.7, hemoglobin 10.4 g/dL and platelets 406. D-dimer was 0.36. Sodium 136, potassium 3.2, chloride 109, bicarbonate 17, lactic acid #1 was 2.23 and lactic acid #2 was 1.89 mmol/L after IV fluids. Chest radiograph did not show any acute pathology.   Hospital Course:  Principal Problem:   Diabetic gastroparesis ([place]) Active Problems:   Essential hypertension, benign  Uncontrolled type 2 diabetes mellitus with diabetic neuropathy, with long-term current use of insulin ([facility])   CKD  (chronic kidney disease), stage II   Hypokalemia   Hypomagnesemia   Intractable nausea and vomiting   Urinary tract infection   Anemia   Gastroparesis   Cough:  -no hypoxia, no chest pain --cxr  "Right perihilar and infrahilar opacity is noted concerning for pneumonia or atelectasis. Continued radiographic follow-up is Recommended. - she was treated with cipro initially due to concern about possible uti, cipro stopped on 6/13,  levaquin started on 6/14 to cover possible aspiration pneumonia (she is allergic to pcn), plan to finish total of 7 days abx treatment -persistent cough on 6/17 cxr two views with interstitial edema, left lower lobe edema vs infectious process,  s/p lasix40mg  x1,   levaquin to finish course, last dose on 6/18. -pmd to follow up to repeat cxr in 3-4 weeks.    Acute on chronic gastroparesis with intractable nausea and vomiting.  --She has frequent admissions for gastroparesis, though this has not seems to affect her weight --she received IV reglan, iv antiemetics, IV antiacids and IV fluids,   --improving, now she is eating 100% --she reports primary care physician started her on sucralfate which has helped, this is continued. She denies constipation --she reports no GI doctors in the past, she is advised to follow up with GI motility specialist at [facility]. -avoid narcotic as this will cause decreased gi motility.   ? Urine tract infection (present on admission).  UA showed wbc TNTC, few bacteria,  urine culture was not done, patient denies symptom, patient has remained afebrile, no leukocytosis,  She received Antibiotic therapy with ciprofloxacin for three days, now she is on levaquin for aspiration pneumonia, she report urine is clear  AKI/CKD stage 2.  Cr 1.4 on admission, cr 0.78 on 6/14 , cr 1.02  on 6/18 She is to follow up with pmd, repeat labs   Hypokalemia/Hypomagnesemia.  mag 1.4, k 3.4 today on 6/18 She received iv mag on 6/18 again, she  is discharged on oral mag and k supplement, pmd to repeat labs at follow up.  Insulin dependent T2DM.   a1c 9.7 At home with insulin 75/25, 60 units am and 50 units pm.  Long acting insulin held initially  due to inconsistent oral intake,  Now she eat 100% ,  on lantus 40units qhs and  ssi (4unit an, 7uint lunch, 3units dinner), blood sugar range from 223 to 298,  She is discharged on home insulin regimen, suspect diet noncompliance at home which requires higher home insulin does, she need to continue follow with pmd for blood sugar control.   HTN.  Blood pressure control with metoprolol,  resume amlodipine blood pressure systolic.  Oral meds intake not consistent though due to n/v  H/o cva: stable, continue home meds  Depression. Tolerating well duloxetine.    Morbid obesity:  Body mass index is 47.54 kg/m.     DVT prophylaxis:scd Code Status:Full  Family Communication:patient Disposition Plan:Home now that she eats 100% of her meals, no n/v.   Consultants:  none  Procedures: none  Antimicrobials:  cipro then levaquin    Discharge Exam: BP 106/65 (BP Location: Left Arm)   Pulse 87   Temp 97.8 F (36.6 C) (Oral)   Resp 19   Ht 5\' 6"  (1.676 m)   Wt 133.6 kg (294 lb 8.6 oz)   LMP 10/10/2012   SpO2 94%   BMI 47.54 kg/m  General: obese Cardiovascular: RRR Respiratory: CTABL Ab: epigastric tenderness has resolved, soft, + bowel sounds  Discharge Instructions You were cared for by a hospitalist during your hospital stay. If you have any questions about your discharge medications or the care you received while you were in the hospital after you are discharged, you can call the unit and asked to speak with the hospitalist on call if the hospitalist that took care of you is not available. Once you are discharged, your primary care physician will handle any further medical issues. Please note that NO REFILLS for any discharge medications  will be authorized once you are discharged, as it is imperative that you return to your primary care physician (or establish a relationship with a primary care physician if you do not have one) for your aftercare needs so that they can reassess your need for medications and monitor your lab values.  Discharge Instructions    Diet - low sodium heart healthy    Complete by:  As directed    Carb modified, gastroparesis diet (small amount each time, multiple times a day)   Increase activity slowly    Complete by:  As directed      Allergies as of 08/23/2016      Reactions   Lisinopril Anaphylaxis   Tongue and mouth swelling   Penicillins Palpitations   Has patient had a PCN reaction causing immediate rash, facial/tongue/throat swelling, SOB or lightheadedness with hypotension: Yes, heart races Has patient had a PCN reaction causing severe rash involving mucus membranes or skin necrosis: No Has patient had a PCN reaction that required hospitalization: Yes  Has patient had a PCN reaction occurring within the last 10 years: No   Valium [diazepam] [name]   Aspirin Other (See Comments)   Irritates stomach ulcer    Food Hives, Swelling   Carrots, ketchup    Nsaids Other (See Comments)   ULCER   Tylenol [acetaminophen] Other (See Comments)   Irritates stomach ulcer    Tramadol Nausea And Vomiting      Medication List    STOP taking these medications   ondansetron 4 MG disintegrating tablet Commonly known as:  ZOFRAN ODT     TAKE these medications   albuterol 108 (90 Base) MCG/ACT inhaler Commonly known as:  PROVENTIL HFA;VENTOLIN HFA Inhale 2 puffs into the lungs every 6 (six) hours as needed for wheezing or shortness of breath.   allopurinol 100 MG tablet Commonly known as:  ZYLOPRIM Take 1 tablet (100 mg total) by mouth daily.   amLODipine 10 MG tablet Commonly known as:  NORVASC Take 1 tablet (10 mg total) by mouth daily.   atorvastatin 20 MG tablet Commonly  known as:  LIPITOR Take 20 mg by mouth at bedtime.   benzonatate 100 MG capsule Commonly known as:  TESSALON Take 1 capsule (100 mg total) by mouth 3 (three) times daily as needed for cough.   DULoxetine 30 MG capsule Commonly known as:  CYMBALTA Take 1 capsule (30 mg total) by mouth 2 (two) times daily.   Magnesium Oxide 400 (240 Mg) MG Tabs Take 1 tablet (400 mg total) by mouth daily at 2 PM.   metoCLOPramide 10 MG tablet Commonly known as:  REGLAN Take 1 tablet (10 mg total) by mouth 3 (three) times daily before meals.   metoprolol succinate 25 MG 24 hr tablet Commonly known as:  TOPROL-XL Take 1 tablet (25 mg total) by mouth daily.   nitroGLYCERIN 0.4 MG SL  tablet Commonly known as:  NITROSTAT Place 1 tablet (0.4 mg total) under the tongue every 5 (five) minutes as needed for chest pain.   NOVOLOG MIX 70/30 (70-30) 100 UNIT/ML injection Generic drug:  insulin aspart protamine- aspart 75 units in am, 60 units  lunch, and 50 units pm What changed:  how much to take  how to take this  when to take this  additional instructions   pantoprazole 40 MG tablet Commonly known as:  PROTONIX Take 1 tablet (40 mg total) by mouth 2 (two) times daily.   potassium chloride 10 MEQ tablet Commonly known as:  K-DUR Take 2 tablets (20 mEq total) by mouth daily.   sucralfate 1 g tablet Commonly known as:  CARAFATE Take 1 tablet (1 g total) by mouth 4 (four) times daily -  with meals and at bedtime.      Allergies  Allergen Reactions  . Lisinopril Anaphylaxis    Tongue and mouth swelling  . Penicillins Palpitations    Has patient had a PCN reaction causing immediate rash, facial/tongue/throat swelling, SOB or lightheadedness with hypotension: Yes, heart races Has patient had a PCN reaction causing severe rash involving mucus membranes or skin necrosis: No Has patient had a PCN reaction that required hospitalization: Yes  Has patient had a PCN reaction occurring within the  last 10 years: No   . Valium [Diazepam] [name]  . Aspirin Other (See Comments)    Irritates stomach ulcer   . Food Hives and Swelling    Carrots, ketchup   . Nsaids Other (See Comments)    ULCER  . Tylenol [Acetaminophen] Other (See Comments)    Irritates stomach ulcer   . Tramadol Nausea And Vomiting   Follow-up Information    [place], [place], [place] Follow up in 1 week(s).   Specialty:  General Practice Why:  hospital discharge follow up, repeat cbc/bmp at follow up, please continue to work with your doctor for blood sugar control pmd to repeat chest x ray in 3-4 weeks  Contact information: [place] [address] [phone]        Scherrie November, MD Follow up in 1 month(s).   Specialty:  Internal Medicine Why:  gastroparesis  Contact information: [address] [place] [address] [phone]            The results of significant diagnostics from this hospitalization (including imaging, microbiology, ancillary and laboratory) are listed below for reference.    Significant Diagnostic Studies: Dg Chest 2 View  Result Date: 08/22/2016 CLINICAL DATA:  Recent admitted to the medical floor with intractable nausea and vomiting due to gastroparesis complicated by urinary tract infection. Developed and continued cough - diabetic - hx hypertension - never a smoker EXAM: CHEST  2 VIEW COMPARISON:  08/19/2016 FINDINGS: Heart is normal in size. There is interval increase in diffuse interstitial markings, consistent with pulmonary edema. More confluent opacity in the medial left lower lobe may represent early alveolar edema. Infectious infiltrate could have a similar appearance. IMPRESSION: Increased interstitial edema. Left lower lobe edema and/or infectious process. Electronically Signed   By: Nolon Nations M.D.   On: 08/22/2016 14:12   Dg Chest 2 View  Result Date: 08/15/2016 CLINICAL DATA:  Acute onset of nausea and  vomiting. Mid chest pain and upper abdominal pain. [name]. Initial encounter. EXAM: CHEST  2 VIEW COMPARISON:  Chest radiograph performed 06/17/2016 FINDINGS: The lungs are well-aerated and clear. There is no  evidence of focal opacification, pleural effusion or pneumothorax. The heart is normal in size; the mediastinal contour is within normal limits. No acute osseous abnormalities are seen. Clips are noted within the right upper quadrant, reflecting prior cholecystectomy. IMPRESSION: No acute cardiopulmonary process seen. Electronically Signed   By: Garald Balding M.D.   On: 08/15/2016 22:46   Dg Chest Port 1 View  Result Date: 08/19/2016 CLINICAL DATA:  Cough. EXAM: PORTABLE CHEST 1 VIEW COMPARISON:  Radiographs of August 15, 2016. FINDINGS: Normal cardiac silhouette is noted. No pneumothorax or pleural effusion is noted. Right perihilar and infrahilar opacity is noted which is increased compared to prior exam concerning for pneumonia or atelectasis. Left lung is clear. The visualized skeletal structures are unremarkable. IMPRESSION: Right perihilar and infrahilar opacity is noted concerning for pneumonia or atelectasis. Continued radiographic follow-up is recommended. Electronically Signed   By: Marijo Conception, M.D.   On: 08/19/2016 10:41    Microbiology: No results found for this or any previous visit (from the past 240 hour(s)).   Labs: Basic Metabolic Panel:  Recent Labs Lab 08/19/16 0739 08/20/16 0612 08/20/16 1359 08/21/16 0513 08/22/16 0702 08/23/16 0531  NA 138 138 137 137 137 137  K 3.0* 2.8* 3.1* 3.0* 3.2* 3.4*  CL 106 105 104 105 106 103  CO2 23 27 25 27 25 28   GLUCOSE 198* 226* 230* 245* 231* 279*  BUN 10 10 8 13 17 16   CREATININE 0.78 0.88 1.03* 1.06* 0.96 1.02*  CALCIUM 8.5* 8.4* 8.5* 8.5* 8.4* 8.6*  MG 1.3* 1.3*  --  1.3* 1.6* 1.4*   Liver Function Tests: No results for input(s): AST, ALT, ALKPHOS, BILITOT, PROT, ALBUMIN in the last 168 hours. No results  for input(s): LIPASE, AMYLASE in the last 168 hours. No results for input(s): AMMONIA in the last 168 hours. CBC:  Recent Labs Lab 08/17/16 0852 08/22/16 0702 08/23/16 0531  WBC 7.2 9.2 9.5  NEUTROABS 5.1 5.5 5.5  HGB 9.1* 8.5* 8.8*  HCT 27.5* 25.7* 26.6*  MCV 85.9 86.0 86.1  PLT 335 310 326   Cardiac Enzymes: No results for input(s): CKTOTAL, CKMB, CKMBINDEX, TROPONINI in the last 168 hours. BNP: BNP (last 3 results)  Recent Labs  08/15/16 2229  BNP 25.4    ProBNP (last 3 results) No results for input(s): PROBNP in the last 8760 hours.  CBG:  Recent Labs Lab 08/22/16 1717 08/22/16 2102 08/23/16 0208 08/23/16 0544 08/23/16 0742  GLUCAP 248* 298* 223* 248* 245*       SignedFlorencia Reasons MD, PhD  [facility] 08/23/2016, 11:23 AM

## 2016-08-23 NOTE — Progress Notes (Signed)
PT Cancellation Note  Patient Details Name: Deborah [name] MRN: [id number] DOB: November 29, 1965   Cancelled Treatment:    Reason Eval/Treat Not Completed: Pt is set to d/c home. She declined participation with PT prior to d/c.    Weston Anna, MPT Pager: [phone]

## 2016-08-23 NOTE — Progress Notes (Signed)
Date: August 23, 2016  Discharge orders review for case management needs.  None found  Per patient or family member no additional needs at home. Velva Harman, BSN, [place], CCM:  [phone]

## 2016-08-27 IMAGING — CR DG FOOT COMPLETE 3+V*R*
3 series · 3 of 3 positions shown · non-contrast
Comparison: 03/20/2014.

CLINICAL DATA: 49-year-old female with increasing right foot pain
and soft tissue swelling. Initial encounter.

EXAM:
RIGHT FOOT COMPLETE - 3+ VIEW

[x foot ap right]
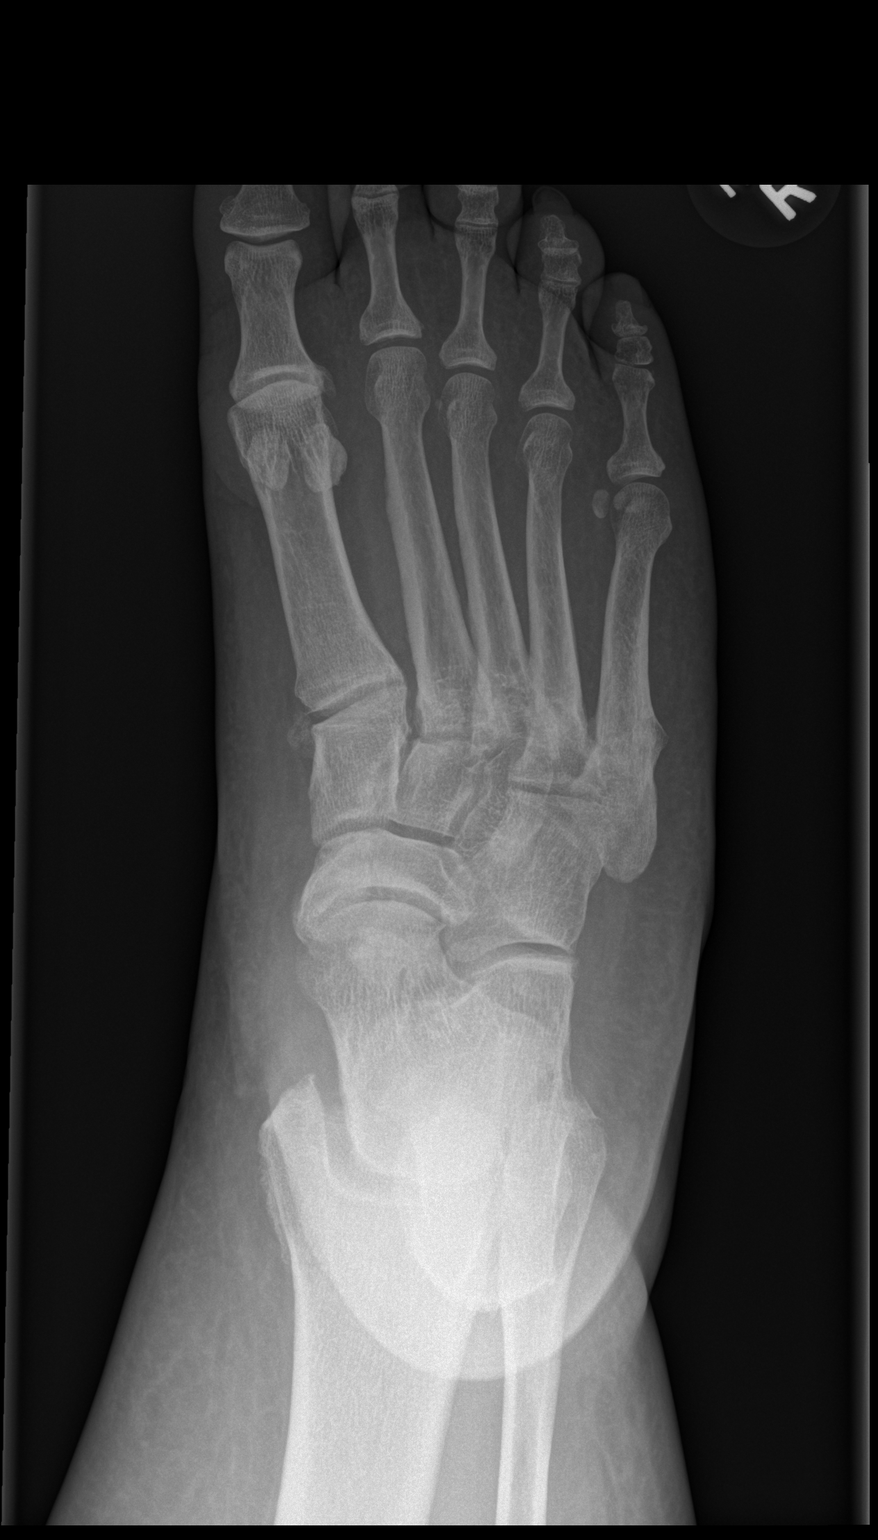

[x foot obl right]
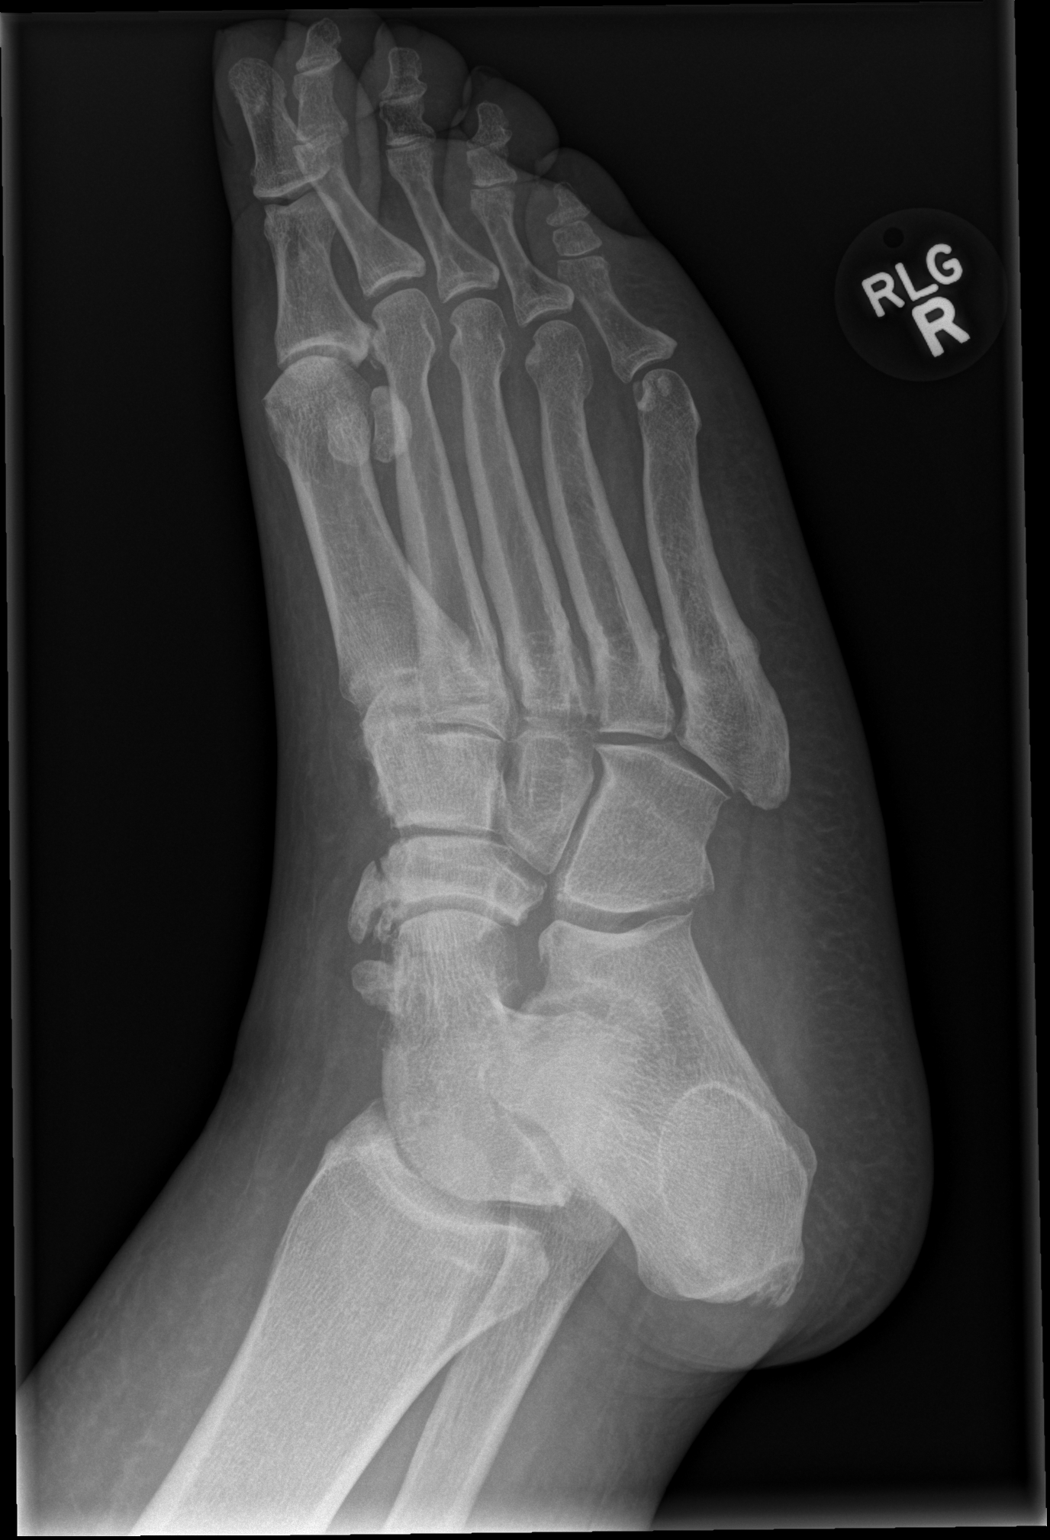

[x foot lat right]
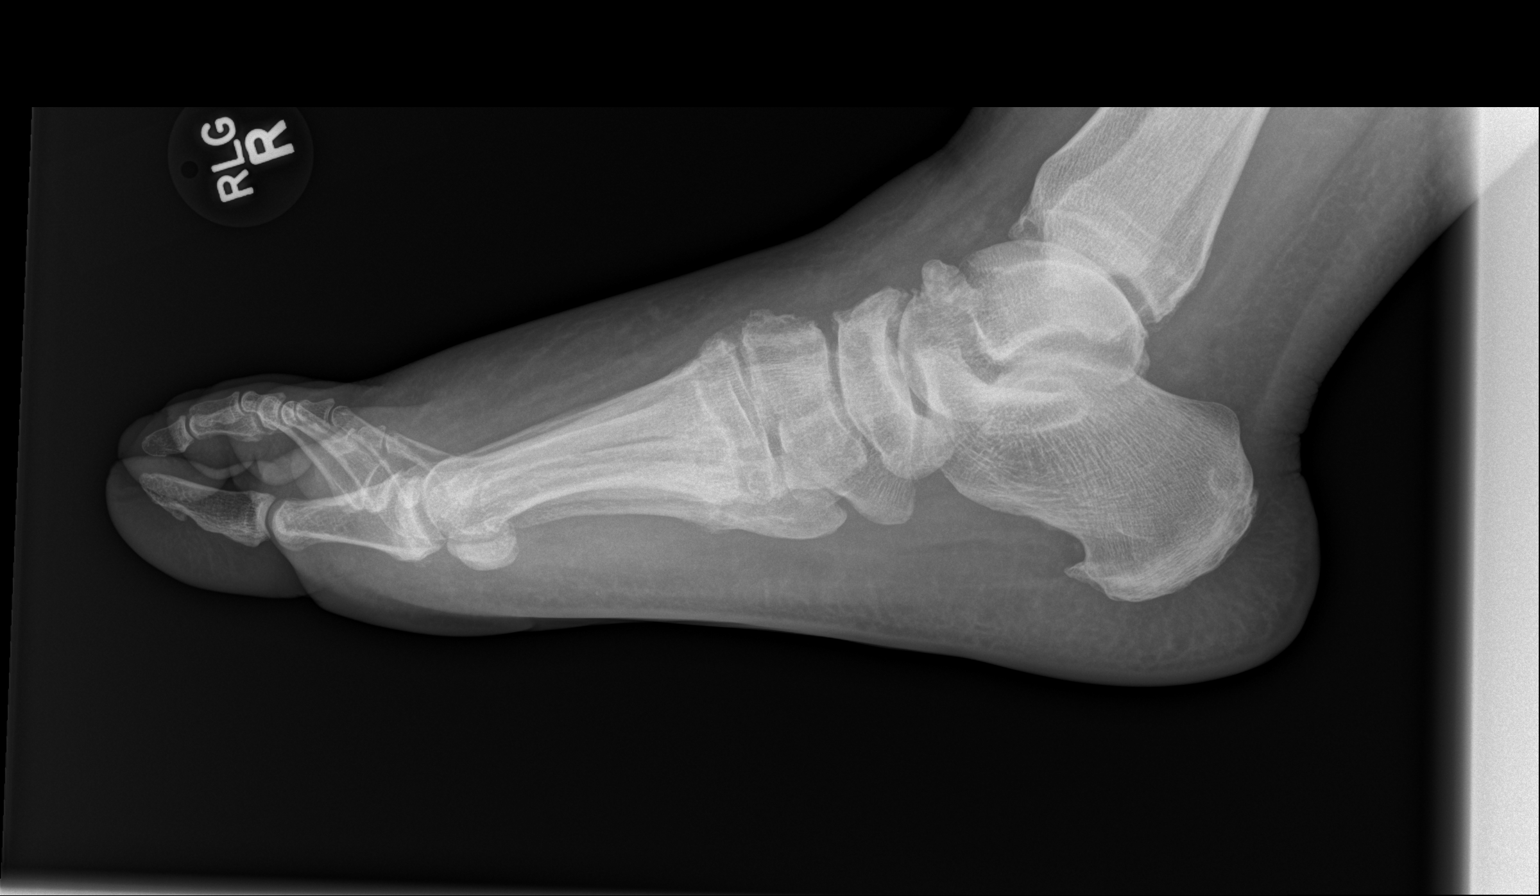

[3 of 3 positions shown; findings below may reference images not displayed]

FINDINGS: The calcaneus remains intact with degenerative spurring. Soft tissue
swelling about the right foot appears mildly regressed since
[REDACTED]. Widespread tarsal and TMT joint degeneration. Bulky
osteophytosis at the talonavicular joint. First MTP joint
degeneration again noted. No fracture, dislocation, or osteolysis.
No subcutaneous gas.
IMPRESSION: Mildly improve soft tissue swelling since [REDACTED].

No acute osseous abnormality identified in the right foot.

Chronic degenerative changes, advanced at the talonavicular joint.

## 2016-08-27 IMAGING — CR DG CHEST 2V
2 series · 2 of 2 positions shown · non-contrast
Comparison: December 12, 2013

CLINICAL DATA: Cough and congestion ; shortness of breath

EXAM:
CHEST  2 VIEW

[w chest pa]
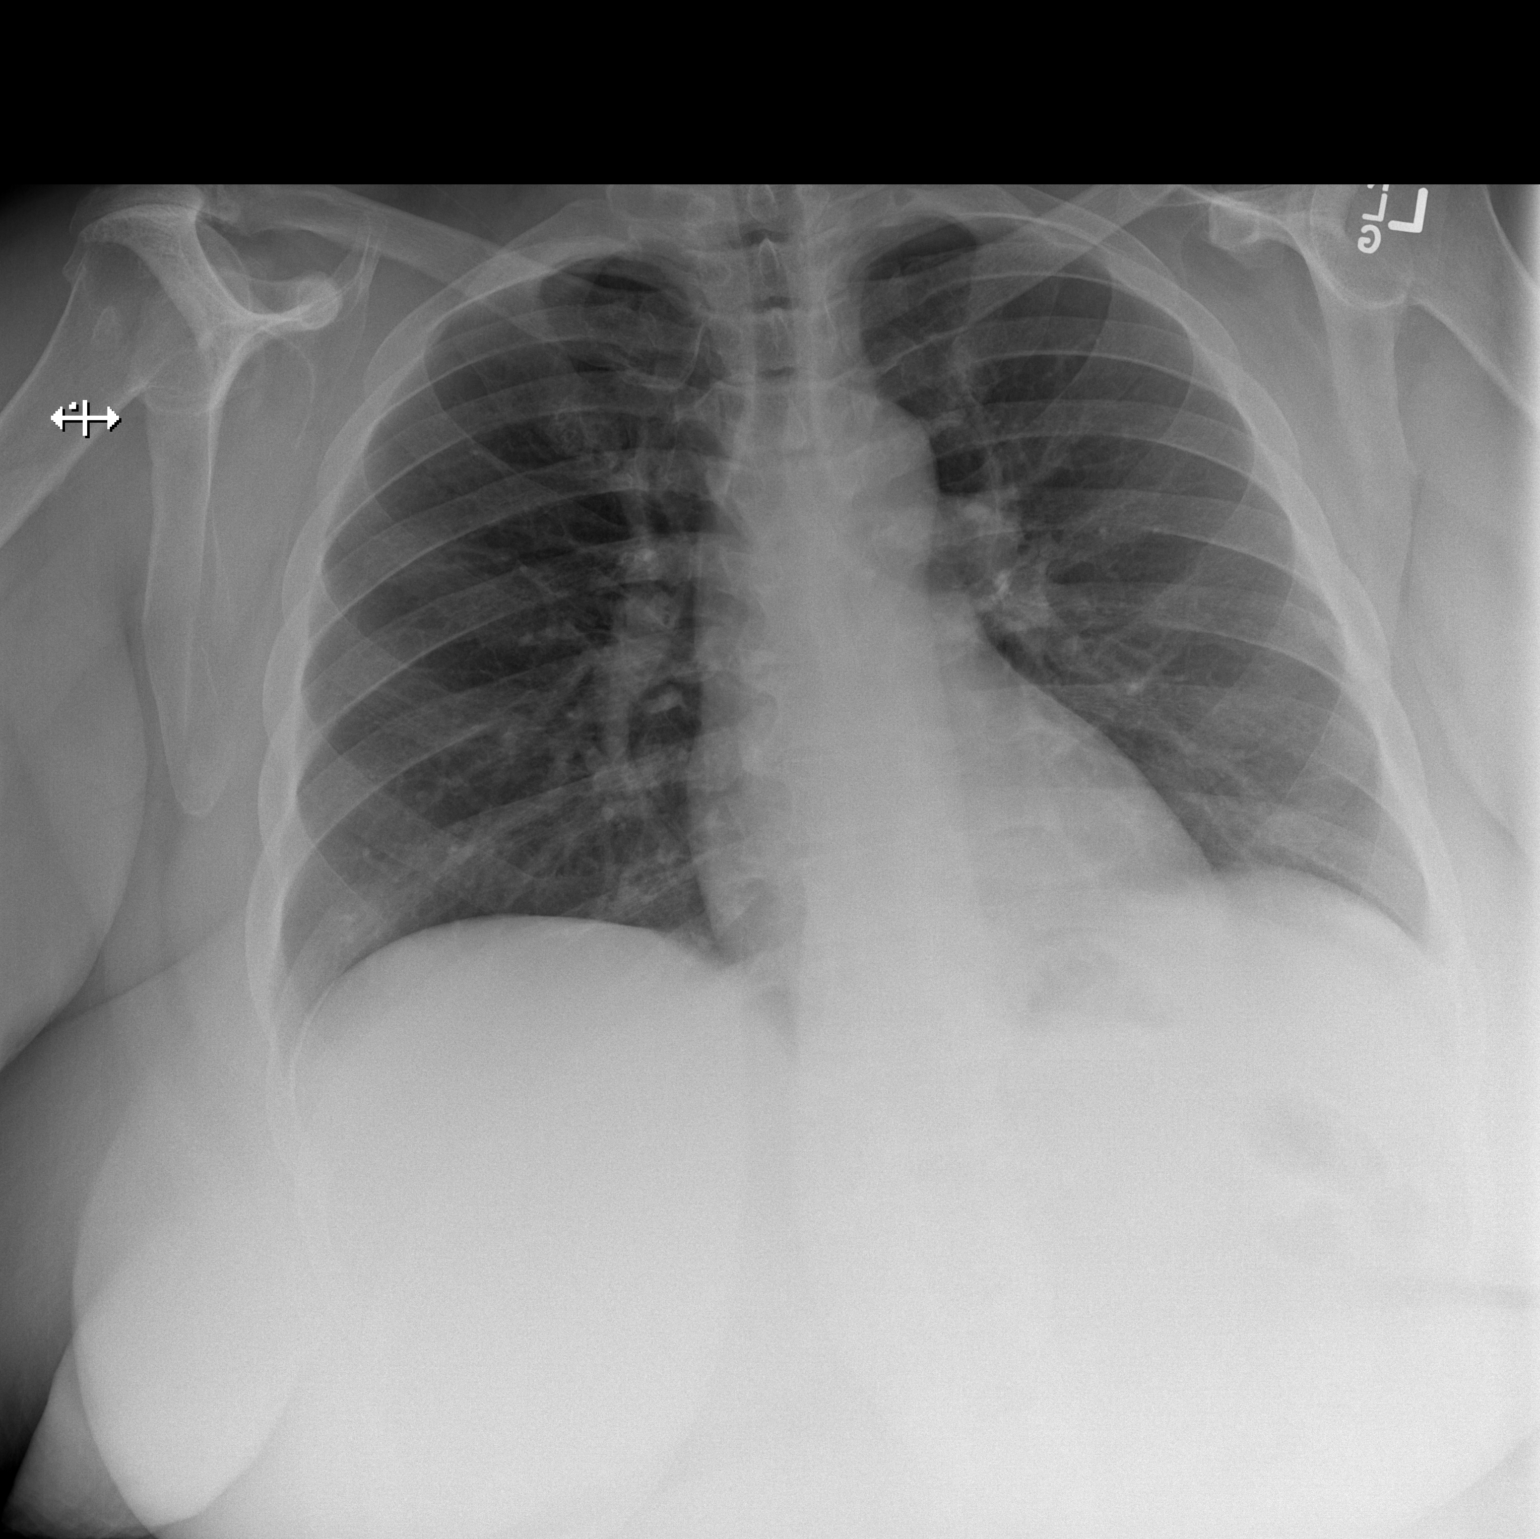

[w chest lat]
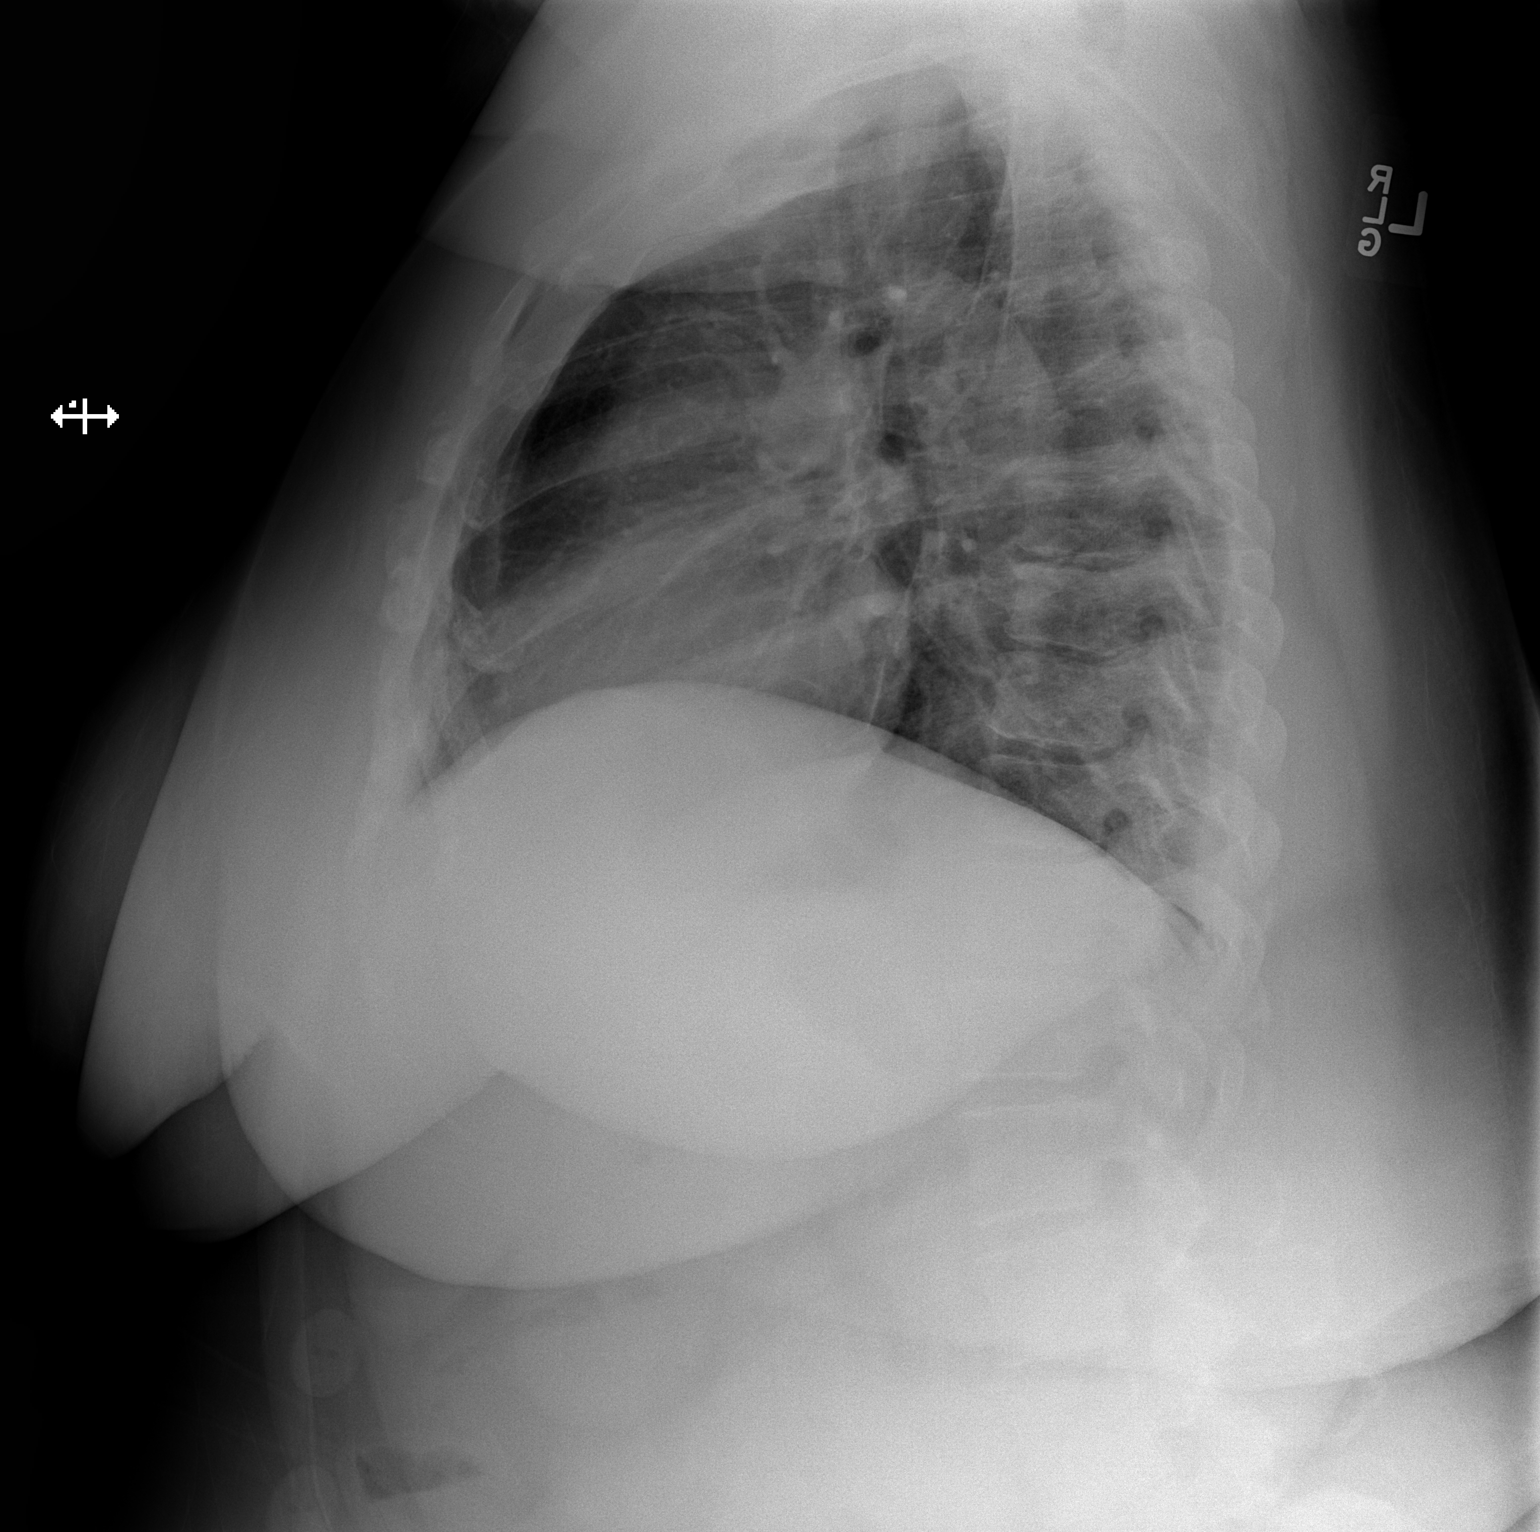

[2 of 2 positions shown; findings below may reference images not displayed]

FINDINGS: Lungs are clear. Heart size and pulmonary vascularity are normal. No
adenopathy. There is degenerative change in each shoulder.
IMPRESSION: No edema or consolidation.

## 2017-02-02 ENCOUNTER — Emergency Department: Payer: Charity

## 2017-02-02 ENCOUNTER — Inpatient Hospital Stay
Admission: EM | Admit: 2017-02-02 | Discharge: 2017-02-04 | DRG: 638 | Disposition: A | Discharge status: Home / Self Care | Payer: Self-pay | Attending: Internal Medicine | Admitting: Internal Medicine

## 2017-02-02 ENCOUNTER — Other Ambulatory Visit: Payer: Self-pay

## 2017-02-02 DIAGNOSIS — N179 Acute kidney failure, unspecified: Secondary | ICD-10-CM | POA: Diagnosis present

## 2017-02-02 DIAGNOSIS — R0789 Other chest pain: Secondary | ICD-10-CM | POA: Diagnosis present

## 2017-02-02 DIAGNOSIS — E860 Dehydration: Secondary | ICD-10-CM | POA: Diagnosis present

## 2017-02-02 DIAGNOSIS — G4733 Obstructive sleep apnea (adult) (pediatric): Secondary | ICD-10-CM | POA: Diagnosis present

## 2017-02-02 DIAGNOSIS — R079 Chest pain, unspecified: Secondary | ICD-10-CM | POA: Diagnosis present

## 2017-02-02 DIAGNOSIS — K3184 Gastroparesis: Secondary | ICD-10-CM | POA: Diagnosis present

## 2017-02-02 DIAGNOSIS — I10 Essential (primary) hypertension: Secondary | ICD-10-CM | POA: Diagnosis present

## 2017-02-02 DIAGNOSIS — E1142 Type 2 diabetes mellitus with diabetic polyneuropathy: Secondary | ICD-10-CM | POA: Diagnosis present

## 2017-02-02 DIAGNOSIS — M797 Fibromyalgia: Secondary | ICD-10-CM | POA: Diagnosis present

## 2017-02-02 DIAGNOSIS — Z8673 Personal history of transient ischemic attack (TIA), and cerebral infarction without residual deficits: Secondary | ICD-10-CM

## 2017-02-02 DIAGNOSIS — Z23 Encounter for immunization: Secondary | ICD-10-CM

## 2017-02-02 DIAGNOSIS — D259 Leiomyoma of uterus, unspecified: Secondary | ICD-10-CM | POA: Diagnosis present

## 2017-02-02 DIAGNOSIS — Z794 Long term (current) use of insulin: Secondary | ICD-10-CM

## 2017-02-02 DIAGNOSIS — R000 Tachycardia, unspecified: Secondary | ICD-10-CM | POA: Diagnosis present

## 2017-02-02 DIAGNOSIS — Z79899 Other long term (current) drug therapy: Secondary | ICD-10-CM

## 2017-02-02 DIAGNOSIS — E1143 Type 2 diabetes mellitus with diabetic autonomic (poly)neuropathy: Secondary | ICD-10-CM | POA: Diagnosis present

## 2017-02-02 DIAGNOSIS — Z833 Family history of diabetes mellitus: Secondary | ICD-10-CM

## 2017-02-02 DIAGNOSIS — Z6841 Body Mass Index (BMI) 40.0 and over, adult: Secondary | ICD-10-CM

## 2017-02-02 DIAGNOSIS — R1033 Periumbilical pain: Secondary | ICD-10-CM | POA: Diagnosis present

## 2017-02-02 DIAGNOSIS — Z8711 Personal history of peptic ulcer disease: Secondary | ICD-10-CM

## 2017-02-02 DIAGNOSIS — G8929 Other chronic pain: Secondary | ICD-10-CM | POA: Diagnosis present

## 2017-02-02 DIAGNOSIS — E1165 Type 2 diabetes mellitus with hyperglycemia: Principal | ICD-10-CM | POA: Diagnosis present

## 2017-02-02 HISTORY — DX: Type 2 diabetes mellitus without complications: E11.9

## 2017-02-02 HISTORY — DX: Essential (primary) hypertension: I10

## 2017-02-02 LAB — COMPREHENSIVE METABOLIC PANEL
ALT: 14 U/L (ref 0–55)
AST (SGOT): 17 U/L (ref 5–34)
Albumin/Globulin Ratio: 0.8 — ABNORMAL LOW (ref 0.9–2.2)
Albumin: 3.3 g/dL — ABNORMAL LOW (ref 3.5–5.0)
Alkaline Phosphatase: 105 U/L (ref 37–106)
BUN: 22 mg/dL — ABNORMAL HIGH (ref 7.0–19.0)
Bilirubin, Total: 0.5 mg/dL (ref 0.2–1.2)
CO2: 19 mEq/L — ABNORMAL LOW (ref 22–29)
Calcium: 10 mg/dL (ref 8.5–10.5)
Chloride: 106 mEq/L (ref 100–111)
Creatinine: 1.3 mg/dL — ABNORMAL HIGH (ref 0.6–1.0)
Globulin: 4 g/dL — ABNORMAL HIGH (ref 2.0–3.6)
Glucose: 241 mg/dL — ABNORMAL HIGH (ref 70–100)
Potassium: 4.3 mEq/L (ref 3.5–5.1)
Protein, Total: 7.3 g/dL (ref 6.0–8.3)
Sodium: 136 mEq/L (ref 136–145)

## 2017-02-02 LAB — CBC AND DIFFERENTIAL
Absolute NRBC: 0 10*3/uL
Basophils Absolute Automated: 0.06 10*3/uL (ref 0.00–0.20)
Basophils Automated: 0.5 %
Eosinophils Absolute Automated: 0.18 10*3/uL (ref 0.00–0.70)
Eosinophils Automated: 1.6 %
Hematocrit: 33.2 % — ABNORMAL LOW (ref 37.0–47.0)
Hgb: 10.5 g/dL — ABNORMAL LOW (ref 12.0–16.0)
Immature Granulocytes Absolute: 0.04 10*3/uL
Immature Granulocytes: 0.4 %
Lymphocytes Absolute Automated: 4.1 10*3/uL (ref 0.50–4.40)
Lymphocytes Automated: 36.6 %
MCH: 28.3 pg (ref 28.0–32.0)
MCHC: 31.6 g/dL — ABNORMAL LOW (ref 32.0–36.0)
MCV: 89.5 fL (ref 80.0–100.0)
MPV: 10.7 fL (ref 9.4–12.3)
Monocytes Absolute Automated: 0.63 10*3/uL (ref 0.00–1.20)
Monocytes: 5.6 %
Neutrophils Absolute: 6.18 10*3/uL (ref 1.80–8.10)
Neutrophils: 55.3 %
Nucleated RBC: 0 /100 WBC (ref 0.0–1.0)
Platelets: 326 10*3/uL (ref 140–400)
RBC: 3.71 10*6/uL — ABNORMAL LOW (ref 4.20–5.40)
RDW: 14 % (ref 12–15)
WBC: 11.19 10*3/uL — ABNORMAL HIGH (ref 3.50–10.80)

## 2017-02-02 LAB — I-STAT CG4 VENOUS CARTRIDGE
Lactic Acid I-Stat: 2 mmol/L (ref 0.2–2.0)
i-STAT Base Excess Venous: -1 mEq/L
i-STAT FIO2: 21
i-STAT HCO3 Bicarbonate Venous: 21.9 mEq/L
i-STAT O2 Saturation Venous: 43 %
i-STAT Patient Temperature: 97.9
i-STAT Total CO2 Venous: 23 mEq/L
i-STAT pCO2 Venous: 29.1
i-STAT pH Venous: 7.483
i-STAT pO2 Venous: 21

## 2017-02-02 LAB — URINALYSIS, REFLEX TO MICROSCOPIC EXAM IF INDICATED
Bilirubin, UA: NEGATIVE
Glucose, UA: >=500 — AB
Ketones UA: 20 — AB
Leukocyte Esterase, UA: NEGATIVE
Nitrite, UA: NEGATIVE
Protein, UR: >300 — AB
Specific Gravity UA: 1.044 (ref 1.001–1.035)
Urine pH: 5 (ref 5.0–8.0)
Urobilinogen, UA: NORMAL mg/dL

## 2017-02-02 LAB — ECG 12-LEAD
Atrial Rate: 105 {beats}/min
Atrial Rate: 105 {beats}/min
P Axis: 54 degrees
P Axis: 54 degrees
P-R Interval: 150 ms
P-R Interval: 150 ms
Q-T Interval: 350 ms
Q-T Interval: 350 ms
QRS Duration: 78 ms
QRS Duration: 78 ms
QTC Calculation (Bezet): 462 ms
QTC Calculation (Bezet): 462 ms
R Axis: -43 degrees
R Axis: -43 degrees
T Axis: 22 degrees
T Axis: 22 degrees
Ventricular Rate: 105 {beats}/min
Ventricular Rate: 105 {beats}/min

## 2017-02-02 LAB — LIPASE: Lipase: 14 U/L (ref 8–78)

## 2017-02-02 LAB — MAGNESIUM: Magnesium: 1.4 mg/dL — ABNORMAL LOW (ref 1.6–2.6)

## 2017-02-02 LAB — GLUCOSE WHOLE BLOOD - POCT: Whole Blood Glucose POCT: 366 mg/dL — ABNORMAL HIGH (ref 70–100)

## 2017-02-02 LAB — B-TYPE NATRIURETIC PEPTIDE: B-Natriuretic Peptide: <10 pg/mL (ref 0–100)

## 2017-02-02 LAB — TROPONIN I: Troponin I: 0.01 ng/mL (ref 0.00–0.09)

## 2017-02-02 LAB — GFR: EGFR: 51.9

## 2017-02-02 LAB — I-STAT TROPONIN: i-STAT Troponin: 0 ng/mL (ref 0.00–0.09)

## 2017-02-02 MED ORDER — METHYLPREDNISOLONE SODIUM SUCC 125 MG IJ SOLR
125.00 mg | Freq: Once | INTRAMUSCULAR | Status: AC
Start: 2017-02-02 — End: 2017-02-02
  Administered 2017-02-02: 14:00:00 125 mg via INTRAVENOUS
  Filled 2017-02-02: qty 2

## 2017-02-02 MED ORDER — ENOXAPARIN SODIUM 40 MG/0.4ML SC SOLN
40.00 mg | Freq: Every day | SUBCUTANEOUS | Status: DC
Start: 2017-02-02 — End: 2017-02-04
  Administered 2017-02-02 – 2017-02-03 (×2): 40 mg via SUBCUTANEOUS
  Filled 2017-02-02 (×2): qty 0.4

## 2017-02-02 MED ORDER — MAGNESIUM SULFATE IN D5W 1-5 GM/100ML-% IV SOLN
1.0000 g | INTRAVENOUS | Status: AC
Start: 2017-02-02 — End: 2017-02-02

## 2017-02-02 MED ORDER — ACETAMINOPHEN-CODEINE #3 300-30 MG PO TABS
1.00 | ORAL_TABLET | ORAL | Status: DC | PRN
Start: 2017-02-02 — End: 2017-02-03

## 2017-02-02 MED ORDER — INSULIN LISPRO 100 UNIT/ML SC SOLN
1.00 [IU] | Freq: Three times a day (TID) | SUBCUTANEOUS | Status: DC | PRN
Start: 2017-02-02 — End: 2017-02-03
  Administered 2017-02-03: 09:00:00 4 [IU] via SUBCUTANEOUS
  Administered 2017-02-03: 14:00:00 3 [IU] via SUBCUTANEOUS

## 2017-02-02 MED ORDER — PANTOPRAZOLE SODIUM 40 MG PO TBEC
40.00 mg | DELAYED_RELEASE_TABLET | Freq: Every morning | ORAL | Status: DC
Start: 2017-02-03 — End: 2017-02-04
  Administered 2017-02-03 – 2017-02-04 (×2): 40 mg via ORAL
  Filled 2017-02-02 (×2): qty 1

## 2017-02-02 MED ORDER — HYDROMORPHONE HCL 1 MG/ML IJ SOLN
0.60 mg | INTRAMUSCULAR | Status: AC | PRN
Start: 2017-02-02 — End: 2017-02-03
  Administered 2017-02-02: 0.6 mg via INTRAVENOUS
  Filled 2017-02-02: qty 1

## 2017-02-02 MED ORDER — TRAMADOL HCL 50 MG PO TABS
50.00 mg | ORAL_TABLET | Freq: Four times a day (QID) | ORAL | Status: DC | PRN
Start: 2017-02-02 — End: 2017-02-02

## 2017-02-02 MED ORDER — MAGNESIUM SULFATE IN D5W 1-5 GM/100ML-% IV SOLN
1.0000 g | INTRAVENOUS | Status: AC
Start: 2017-02-02 — End: 2017-02-03
  Administered 2017-02-02 (×2): 1 g via INTRAVENOUS
  Filled 2017-02-02 (×2): qty 100

## 2017-02-02 MED ORDER — INSULIN NPH ISOPHANE & REGULAR (70-30) 100 UNIT/ML SC SUSP
50.00 [IU] | Freq: Three times a day (TID) | SUBCUTANEOUS | Status: DC
Start: 2017-02-03 — End: 2017-02-03
  Filled 2017-02-02: qty 3

## 2017-02-02 MED ORDER — DILTIAZEM HCL ER COATED BEADS 240 MG PO CP24
240.00 mg | ORAL_CAPSULE | Freq: Every day | ORAL | Status: DC
Start: 2017-02-02 — End: 2017-02-03
  Administered 2017-02-02 – 2017-02-03 (×2): 240 mg via ORAL
  Filled 2017-02-02 (×2): qty 1

## 2017-02-02 MED ORDER — IPRATROPIUM BROMIDE 0.02 % IN SOLN
0.50 mg | Freq: Once | RESPIRATORY_TRACT | Status: AC
Start: 2017-02-02 — End: 2017-02-02
  Administered 2017-02-02: 14:00:00 0.5 mg via RESPIRATORY_TRACT
  Filled 2017-02-02: qty 2.5

## 2017-02-02 MED ORDER — ALBUTEROL SULFATE (2.5 MG/3ML) 0.083% IN NEBU
5.00 mg | INHALATION_SOLUTION | Freq: Once | RESPIRATORY_TRACT | Status: AC
Start: 2017-02-02 — End: 2017-02-02
  Administered 2017-02-02: 14:00:00 5 mg via RESPIRATORY_TRACT
  Filled 2017-02-02: qty 6

## 2017-02-02 MED ORDER — KETOROLAC TROMETHAMINE 30 MG/ML IJ SOLN
30.00 mg | Freq: Once | INTRAMUSCULAR | Status: AC
Start: 2017-02-02 — End: 2017-02-02
  Administered 2017-02-02: 14:00:00 30 mg via INTRAVENOUS
  Filled 2017-02-02: qty 1

## 2017-02-02 MED ORDER — INSULIN LISPRO 100 UNIT/ML SC SOLN
5.00 [IU] | Freq: Once | SUBCUTANEOUS | Status: AC
Start: 2017-02-02 — End: 2017-02-02
  Administered 2017-02-02: 23:00:00 5 [IU] via SUBCUTANEOUS
  Filled 2017-02-02: qty 3

## 2017-02-02 MED ORDER — GLUCOSE 40 % PO GEL
15.00 g | ORAL | Status: DC | PRN
Start: 2017-02-02 — End: 2017-02-04

## 2017-02-02 MED ORDER — GLUCAGON 1 MG IJ SOLR (WRAP)
1.00 mg | INTRAMUSCULAR | Status: DC | PRN
Start: 2017-02-02 — End: 2017-02-04

## 2017-02-02 MED ORDER — METOCLOPRAMIDE HCL 5 MG/ML IJ SOLN
5.00 mg | Freq: Three times a day (TID) | INTRAMUSCULAR | Status: DC
Start: 2017-02-03 — End: 2017-02-04
  Administered 2017-02-03 – 2017-02-04 (×5): 5 mg via INTRAVENOUS
  Filled 2017-02-02 (×5): qty 2

## 2017-02-02 MED ORDER — ONDANSETRON HCL 4 MG/2ML IJ SOLN
4.00 mg | Freq: Once | INTRAMUSCULAR | Status: AC
Start: 2017-02-02 — End: 2017-02-02
  Administered 2017-02-02: 17:00:00 4 mg via INTRAVENOUS
  Filled 2017-02-02: qty 2

## 2017-02-02 MED ORDER — IOHEXOL 240 MG/ML IJ SOLN
50.00 mL | Freq: Once | INTRAMUSCULAR | Status: AC | PRN
Start: 2017-02-02 — End: 2017-02-02
  Administered 2017-02-02: 17:00:00 50 mL via ORAL

## 2017-02-02 MED ORDER — IOHEXOL 350 MG/ML IV SOLN
100.00 mL | Freq: Once | INTRAVENOUS | Status: AC | PRN
Start: 2017-02-02 — End: 2017-02-02
  Administered 2017-02-02: 17:00:00 100 mL via INTRAVENOUS

## 2017-02-02 MED ORDER — INFLUENZA VAC SPLIT QUAD 0.5 ML IM SUSY
0.50 mL | PREFILLED_SYRINGE | Freq: Once | INTRAMUSCULAR | Status: AC
Start: 2017-02-03 — End: 2017-02-03
  Administered 2017-02-03: 13:00:00 0.5 mL via INTRAMUSCULAR
  Filled 2017-02-02: qty 0.5

## 2017-02-02 MED ORDER — DEXTROSE 50 % IV SOLN
12.50 g | INTRAVENOUS | Status: DC | PRN
Start: 2017-02-02 — End: 2017-02-04

## 2017-02-02 MED ORDER — LACTATED RINGERS IV SOLN
INTRAVENOUS | Status: AC
Start: 2017-02-02 — End: 2017-02-03

## 2017-02-02 MED ORDER — DULOXETINE HCL 60 MG PO CPEP
60.00 mg | ORAL_CAPSULE | Freq: Two times a day (BID) | ORAL | Status: DC
Start: 2017-02-02 — End: 2017-02-04
  Administered 2017-02-02 – 2017-02-04 (×4): 60 mg via ORAL
  Filled 2017-02-02 (×5): qty 1

## 2017-02-02 MED ORDER — SODIUM CHLORIDE 0.9 % IV BOLUS
1000.00 mL | Freq: Once | INTRAVENOUS | Status: AC
Start: 2017-02-02 — End: 2017-02-02
  Administered 2017-02-02: 14:00:00 1000 mL via INTRAVENOUS

## 2017-02-02 NOTE — ED Provider Notes (Signed)
[place] [facility] EMERGENCY DEPARTMENT H&P         CLINICAL INFORMATION        HPI:      Chief Complaint: Chest Pain  .    Sherrita Riederer is a 51 y.o. female who presents with mild to moderate CP leg cramping and SOB. Pain is described as sharp and stabbing and pt is experiencing accompanying leg pain/weakness, diffuse abd pain, and SOB. Pain is primarily localized to R side of upper chest. Pt was at work this AM and around 8:30 AM began experiencing pain.     Recently seen on Nov 22nd by [facility] in [place] for similar sxs where they conducted a CT scan to rule out possible PE. PE was ruled out but pt dx with pneumonia and put on 5 day course of antibiotics. Wanted to admit but pt declined because she wanted to be able to go to her job.    History obtained from: Patient          ROS:      Positive and negative ROS elements as per HPI.  All other systems reviewed and negative.      Physical Exam:      Pulse 93  BP 186/89  Resp 20  SpO2 100 %  Temp      Physical Exam   Constitutional: She is oriented to person, place, and time. She appears well-developed and well-nourished. She appears distressed.   HENT:   Head: Normocephalic and atraumatic.   Eyes: Pupils are equal, round, and reactive to light. Conjunctivae and EOM are normal.   Cardiovascular: Regular rhythm, normal heart sounds and intact distal pulses.  Tachycardia present.    Pulmonary/Chest: Effort normal and breath sounds normal. No respiratory distress. She has no wheezes.   Bilateral expiratory wheezing.   Abdominal: Soft. Bowel sounds are normal. There is no tenderness.   Diffuse abd tenderness.   Musculoskeletal: Normal range of motion. She exhibits no edema or tenderness.   Neurological: She is alert and oriented to person, place, and time. No cranial nerve deficit.   Skin: Skin is warm and dry. She is not diaphoretic.   Psychiatric: She has a normal mood and affect. Her behavior is normal. Judgment and thought  content normal.   Nursing note and vitals reviewed.               PAST HISTORY        Primary Care Provider: No primary care provider on file.        PMH/PSH:    .     Past Medical History:   Diagnosis Date   . Diabetes mellitus    . Hypertension        She has no past surgical history on file.      Social/Family History:      She reports that she has never smoked. She has never used smokeless tobacco. Her alcohol and drug histories are not on file.    History reviewed. No pertinent family history.      Listed Medications on Arrival:    .     Home Medications     None on File         Allergies: She is allergic to lisinopril; morphine; penicillins; and valium [diazepam].           Visit date: 02/02/2017      CLINICAL SUMMARY          Diagnosis:    .  Final diagnoses:   Chest pain         MDM Notes:        DDx-              Initial differential diagnosis included: NSTEMI, STEMI, pyelonephritis, UTI, DVT, Pleural effusion, pneumonia      Pt is a 51yF with chest pain. CXR with no PNA. [place] with no DVT. Recent CTA neg for PE. Pt with significant risk factors. EKG without STEMI. Trop neg. Will admit for ACS eval.              VISIT INFORMATION        Clinical Course in the ED:                 Medications Given in the ED:    .     ED Medication Orders     None            Procedures:      Procedures      Interpretations:        O2 sat-           saturation: 100 %; Oxygen use: room air; Interpretation: Normal    EKG -             interpreted by me: sinus tachycardia at 105 BPM. Artifact at baseline. No ST/T wave changes.  Monitor -         interpreted by me: sinus tachycardia at 105 BPM.  Radiology -     interpreted by me with the following observations: CXR without PNA/PTX        Heart Score Calculator    0 points 1 point 2 points    History Slightly suspicious Moderately suspicious Highly suspicious +1   EKG Normal Non-specific repolarization disturbance Significant ST depression 0   Age (years) <45 45-64 ?13 +1   Risk  factors* No known risk factors 1-2 risk factors ?3 risk factors or history of atherosclerotic disease +2   Initial troponin ?normal limit 1-3 normal limit >3 normal limit 0      Total Score   4     *Risk factors: HTN, hypercholesterolemia, DM, obesity (BMI >30 kg/m), smoking (current, or smoking cessation ?3 month), positive family history (parent or sibling with CVD before age 75); atherosclerotic disease: prior MI, PCI/CABG, CVA/TIA, or peripheral arterial disease.  General Recommendations   (Some patients may require a more conservative plan of care)  . Score 0 - 3:  Discharge and follow up with PCP. (Major Adverse Cardiac Event risk 2.5% over next six weeks)  . Score 4 - 6: Observe for 6 hours and consider inpatient or early outpatient stress testing in the [facility] Pain Clinic. (Major Adverse Cardiac Event risk 20.3% over next six weeks)  . Score 7 - 10: Observe and consider in-hospital stress testing or Cardiology consultation. (Major Adverse Cardiac Event risk 72.7% over next six weeks)  A Major Adverse Cardiac Event was defined as all-cause mortality, myocardial infarction, or coronary revascularization.                RESULTS        Lab Results:      Results     ** No results found for the last 24 hours. **              Radiology Results:          No orders to display  Disposition:           Observation Admit      ED Disposition     ED Disposition Condition Date/Time Comment    Observation  Wed Feb 02, 2017  5:23 PM Admitting Physician: Ashok Norris DIEM [[id number]]   Diagnosis: Chest pain [1610960]   Estimated Length of Stay: < 2 midnights   Tentative Discharge Plan?: Home or Self Care [1]   Patient Class: Observation [104]                    Scribe Attestation:      I was acting as a Neurosurgeon for Palma Holter, MD on Cooperman,Shalen  Treatment Team: Scribe: Anselmo Pickler     I am the first provider for this patient and I personally performed the services documented. Treatment Team: Scribe: Anselmo Pickler is  scribing for me on Enderle,Analysia. This note and the patient instructions accurately reflect work and decisions made by me.  Palma Holter, MD            Gae Bon, MD  02/04/17 [phone]

## 2017-02-02 NOTE — EDIE (Signed)
[name]?NOTIFICATION?02/02/2017 12:30?[name], [name]?MRN: [id number]    This patient has registered at the [facility] Emergency Department   For more information visit: [email or web address]   Criteria met      5 ED Visits in 12 Months    Security Events  No recent Security Events currently on file    ED Care Guidelines  There are currently no ED Care Guidelines in [name] for this patient. Please check your facility's medical records system.    Care Providers  Hilmer Aliberti has no care providers on record at this time.   E.D. Visit Count (12 mo.)  Facility Visits   [facility] 5   [facility] 1   Total 6   Note: Visits indicate total known visits.      Recent Emergency Department Visit Summary  Admit Date Facility [facility] Type Major Type Diagnoses or Chief Complaint   Feb 02, 2017 [facility] [place] [place] [facility]  Emergency      CP      Chest Pain      Jan 27, 2017 [facility] [place] [facility]  Emergency     Jan 27, 2017 [facility] [place] [facility]  Emergency      chest pain diff breathing      Chest Pain      Pneumonia, unspecified organism      Nov 26, 2016 [facility] [place] [facility]  Emergency     Nov 26, 2016 [facility] [place] [facility]  Emergency     Nov 26, 2016 [facility] [place] [facility]  Emergency      Chest pain      [name]      Chest pain, unspecified      [name]          Recent Inpatient Visit Summary  No recorded inpatient visits.       Prescription Monitoring Program  000??- Narcotic Use Score  000??- Sedative Use Score  000??- Stimulant Use Score  - All Scores range from 000-999 with 75% of the population scoring < 200 and on 1% scoring above 650  - The last digit of the narcotic, sedative, and stimulant score indicates the  number of active prescriptions of that type  - Higher Use scores correlate with increased prescribers, pharmacies, mg equiv, and overlapping prescriptions   Concerning or unexpectedly high scores should prompt a review of the [name] record; this does not constitute checking [name] for prescribing purposes.    The above information is provided for the sole purpose of patient treatment. Use of this information beyond the terms of Data Sharing Memorandum of Understanding and License Agreement is prohibited. In certain cases not all visits may be represented. Consult the aforementioned facilities for additional information.   ? 2018 [employer] - [place], [place] - [email or web address]

## 2017-02-02 NOTE — Progress Notes (Signed)
(  This RN acting as Theatre stage manager. Admission complete, tele placed and patient settled in room. Bedside RN, Genet made aware.)    Pt admitted from ED for CP. Patient A&Ox4, vss, tachy in 120's. IV CDI, saline locked.  Sinus Tach on tele.  Oriented to room and unit.  Instructed pt on how to call for assistance, pt verbalized understanding.  Bed in lowest position, call light and bedside table within reach.  Floor mat down, bed alarm activated, arm bands on, sign in place, SCD's on.  Family at bedside and plan of care discussed. Safety will be maintained, hourly rounding with toileting opportunities will be provided, will continue to monitor.

## 2017-02-02 NOTE — ED Notes (Signed)
Bed: N 39  Expected date:   Expected time:   Means of arrival: [id number] [place]  Comments:  [id number]

## 2017-02-02 NOTE — H&P (Signed)
ADMISSION HISTORY AND PHYSICAL EXAM    Date Time: 02/02/17 9:58 PM  Patient Name: [name],[name]  Attending Physician: Joylene Draft, MD  Primary Care Physician: Christa See, MD    CC: abdominal pain, chest pain, bilat leg pain      Assessment:   51 yo with DM, gastroparesis, hx CVA, hx PUD presents for evaluation of chest pain/tightness, sob, in setting of persistent abdominal pain for approx a week and progressively worsening leg pain and cramping. Pt has a phlethora of sx, some are chronic with acute worsening.    Plan:   # Chest pain syndrome, which is the presenting complaint this evening. She has hx cardiac stress test a few years ago, done by previous cardiologist, in a different state. However, she has mult risk factors and new development of CAD cannot be ruled out.  - will trend troponin  - cards consult requested at non-urgent line IMG    # SOB, exertional, in setting of obesity. Exam is notable for mild wheezing and tachycardia, with ?S4. Xray is normal  - Received one neb treatment in ED  - Will obtain Echo, eval EF, valves, PAP  - [place] Toprol and will switch to CCB for HBP in setting of tachycardia    # Abd pain, periumbical but also in lower abd. Pt gives hx PUD in the past. CT of the a/p unremarkable except for incidental left adnexal lesion, which I don't think is the cause of pain. Has dx of gastroparesis diagnosed by GES in the past, per pt. Already on H2 blocker at home. Has reglan Rx but uses very rarely  - Start PPI  - GI consult ordered in Epic, pls call in a.m  - Trial of QAC Reglan 5mg   - Will keep NPO pMN for poss EGD  - Pt reports allergy to tramadol and morphine. Oral Tyl 3 ordered and pt refused, citing concern for her "ulcer", requesting IV meds. Will allow for up to 3 doses of IV Dilaudid overnight, pending GI evaluation     # Bilat leg pain, cramping, nontender on exam, but description seems muscular in nature. Pt reports she often has low magnesium. Level is low today  at 1.4  - Will check CK level, replete by IV Mg. If sx resolves, consider daily supplement. If not improved, will need to consider other etiologies    #Dm type 2, current on rather high dose 70/30 (50u/60u/70u AC)  - Will start at 50u QAC and hold if NPO  - Hb a1c for am labs    #Proteinuria, suspect DM nephropathy  - Will send urine random P/Cr ratio    # Mild AKI, baseline Cr 0.9  - Hydrate overnight and repeat labs    #HTN, suboptimal. On Norvasc and low dose toprol. C/o mild leg swelling  - Will observe efficacy on Cardizem. Can add another class besides BB if inadequate.     # Left adnexal lesion incidental on CT  - Pelvic US ordered    # Others:  - DVT ppy with Lovenox  - Full code  - F/u --> has established care with [facility] free clinic. If needs hosp [place] appt can be seen in TCM once        Disposition: (Please see PAF column for Expected D/C Date)   Today's date: 02/02/2017  Admit Date: 02/02/2017 12:30 PM  Service status: Observation:patient is stable and improving.  Clinical Milestones: specialists evaluation  Anticipated discharge needs: none  History of Presenting Illness:   [name] [name] is a 51 y.o. female who presents for eval of abd pain, CP, leg pain, SOB. Pt recently moved here from [place] few months ago. She has DM for `~15 yrs, HTN, CVA in 2012, PUD by EGD, gastroparesis by GES. Pt was doing ok, intermittent abd pain for which she would take her Reglan. A week ago, she presented to an ED in [facility] for CP, leg pain, abd pain, N/V. She had a CTA chest neg for PE, was advised admission for CP eval but she declined. Pt was prescribed Levoquin empirically for pneumonia and finished her 5 day Rx. Most recently she established care with the local free clinic and was started on Ranitidine but it has not helped.     She felt somewhat better and went back to work. Yesterday she had recurrence of abd pain, with N/V which subsided and she went to work today. Pt didn't feel well at work  and also started to have chest tightness and SOB, whereupon her work place called an ambulance. In the ED, she was noted to have wheezing on exam and received solumedrol and nebs. CT of the abd/pelv was done and was mostly unremarkable. Pt reports a phlethora of other sx including leg cramps, DOE, lower abd cramps, chronic diarrhea but no fever, chills. She has lost 70lbs in the last year, intentionally. She does not have orthopnea        Past Medical History:     Past Medical History:   Diagnosis Date   . Diabetes mellitus    . Hypertension        Available old records reviewed, including:  Outside labs    Past Surgical History:   History reviewed. No pertinent surgical history.    Family History:   Many members have DM, HTN, early HF    Social History:   Works as Psychiatric nurse rep  History   Smoking Status   . Never Smoker   Smokeless Tobacco   . Never Used     History   Alcohol use Not on file     History   Drug use: Unknown       Allergies:     Allergies   Allergen Reactions   . Lisinopril Swelling   . Penicillins Nausea And Vomiting   . Valium [Diazepam] Other (See Comments)     dizziness       Medications:     Home Medications     Med List Status:  Complete Set By: Clyda Hurdle, RN at 02/02/2017  7:57 PM        Status Comment         02/02/2017  5:59 PM    Pt did not know most of the strengths of her meds. Attempted to call  [facility] to verify but they were closed. Verified last doses  and med history with pt.                ALLOPURINOL PO     Take 1 tablet by mouth daily.         AmLODIPine Besylate (NORVASC PO)     Take 1 tablet by mouth daily.     ATORVASTATIN CALCIUM PO     Take 1 tablet by mouth daily.     DULoxetine (CYMBALTA) 60 MG capsule     Take 60 mg by mouth 2 (two) times daily.     Insulin Aspart Prot & Aspart (  NOVOLOG MIX 70/30 SC)     Inject into the skin.50 units in the morning, 60 units at lunch time, and 70 units at dinner time.     METOCLOPRAMIDE HCL PO     Take 1 tablet by  mouth daily.     metoprolol tartrate (LOPRESSOR) 25 MG tablet     Take 25 mg by mouth daily.     RANITIDINE HCL PO     Take 1 tablet by mouth daily.                Method by which medications were confirmed on admission: Pharm tech reconciled    Review of Systems:   All other systems were reviewed and are negative    Physical Exam:     Patient Vitals for the past 24 hrs:   BP Temp Temp src Pulse Resp SpO2 Height Weight   02/02/17 1940 178/89 97.7 F (36.5 C) Oral - - 98 % - -   02/02/17 1830 138/73 - - (!) 109 - 100 % - -   02/02/17 1730 122/67 - - (!) 117 20 97 % - -   02/02/17 1700 149/79 - - (!) 114 20 100 % - -   02/02/17 1430 160/78 - - (!) 107 20 100 % - -   02/02/17 1415 - 97.9 F (36.6 C) Oral - - - - -   02/02/17 1300 152/88 - - (!) 105 20 100 % - -   02/02/17 1233 186/89 - - 93 20 100 % 1.676 m (5\' 6" ) 133.8 kg (295 lb)     Body mass index is 47.61 kg/m.    Intake/Output Summary (Last 24 hours) at 02/02/17 2158  Last data filed at 02/02/17 1653   Gross per 24 hour   Intake             1000 ml   Output                0 ml   Net             1000 ml       General: awake, alert, oriented x 3; looks uncomfortable, eyes half closed, obese  HEENT: perrla, eomi, sclera anicteric  oropharynx clear without lesions, mucous membranes moist  Neck: supple, no lymphadenopathy, no thyromegaly, no JVD, no carotid bruits  Cardiovascular: tachycardic, ?S4 gallop; no obvious murmur  Lungs: very faint end exp wheezing  Abdomen: soft, obese, non-distended; no palpable masses, mild diffuse tenderness to palpation  Extremities: no clubbing, cyanosis, or edema, no muscle tenderness in the legs  Neuro: cranial nerves grossly intact, strength 5/5 in upper and lower extremities, sensation intact,   Skin: no rashes or lesions noted        Labs:     Results     Procedure Component Value Units Date/Time    Troponin I [[id number]] Collected:  02/02/17 2115    Specimen:  Blood Updated:  02/02/17 2147     Troponin I 0.01 ng/mL      Glucose Whole Blood - POCT [[id number]]  (Abnormal) Collected:  02/02/17 2108     Updated:  02/02/17 2137     POCT - Glucose Whole blood 366 (H) mg/dL     UA, Reflex to Microscopic (pts  3 + yrs) [[id number]]  (Abnormal) Collected:  02/02/17 2115    Specimen:  Urine Updated:  02/02/17 2137     Urine Type Clean Catch     Color, UA Yellow  Clarity, UA Clear     Specific Gravity UA 1.044     Urine pH 5.0     Leukocyte Esterase, UA Negative     Nitrite, UA Negative     Protein, UR >300 (A)     Glucose, UA >=500 (A)     Ketones UA 20 (A)     Urobilinogen, UA Normal mg/dL      Bilirubin, UA Negative     Blood, UA Moderate (A)     RBC, UA 3 - 5 /hpf      Oval Fat Bodies, UA Rare (A) /hpf     i-Stat CG4 Venous CartrIDge [[id number]] Collected:  02/02/17 1410     Updated:  02/02/17 1603     i-STAT pH Venous 7.483     i-STAT pCO2 Venous 29.1     i-STAT pO2 Venous 21.0     i-STAT HCO3 Bicarbonate Venous 21.9 mEq/L      i-STAT Total CO2 Venous 23.0 mEq/L      i-STAT Base Excess Venous -1.0 mEq/L      i-STAT O2 Saturation Venous 43.0 %      i-STAT Lactic acid 2.0 mmol/L      i-STAT Patient Temperature 97.9     i-STAT FIO2 21     i-STAT O2 Delivery Room Air     i-STAT Allen's Test NA     i-STAT ECMO No     i-STAT Draw Site Venous    i-Stat Troponin [[id number]] Collected:  02/02/17 1415     Updated:  02/02/17 1603     i-STAT Troponin 0.00 ng/mL     Comprehensive metabolic panel [[id number]]  (Abnormal) Collected:  02/02/17 1354    Specimen:  Blood Updated:  02/02/17 1457     Glucose 241 (H) mg/dL      BUN 08.6 (H) mg/dL      Creatinine 1.3 (H) mg/dL      Sodium 578 mEq/L      Potassium 4.3 mEq/L      Chloride 106 mEq/L      CO2 19 (L) mEq/L      Calcium 10.0 mg/dL      Protein, Total 7.3 g/dL      Albumin 3.3 (L) g/dL      AST (SGOT) 17 U/L      ALT 14 U/L      Alkaline Phosphatase 105 U/L      Bilirubin, Total 0.5 mg/dL      Globulin 4.0 (H) g/dL      Albumin/Globulin Ratio 0.8 (L)    Magnesium [[id number]]  (Abnormal) Collected:   02/02/17 1354    Specimen:  Blood Updated:  02/02/17 1457     Magnesium 1.4 (L) mg/dL     Lipase [[id number]] Collected:  02/02/17 1354    Specimen:  Blood Updated:  02/02/17 1457     Lipase 14 U/L     GFR [[id number]] Collected:  02/02/17 1354     Updated:  02/02/17 1457     EGFR 51.9    B-type Natriuretic Peptide [[id number]] Collected:  02/02/17 1354    Specimen:  Blood Updated:  02/02/17 1457     B-Natriuretic Peptide <10 pg/mL     CBC with differential [[id number]]  (Abnormal) Collected:  02/02/17 1354    Specimen:  Blood from Blood Updated:  02/02/17 1424     WBC 11.19 (H) x10 3/uL      Hgb 10.5 (L) g/dL      Hematocrit 29.5 (L) %  Platelets 326 x10 3/uL      RBC 3.71 (L) x10 6/uL      MCV 89.5 fL      MCH 28.3 pg      MCHC 31.6 (L) g/dL      RDW 14 %      MPV 10.7 fL      Neutrophils 55.3 %      Lymphocytes Automated 36.6 %      Monocytes 5.6 %      Eosinophils Automated 1.6 %      Basophils Automated 0.5 %      Immature Granulocyte 0.4 %      Nucleated RBC 0.0 /100 WBC      Neutrophils Absolute 6.18 x10 3/uL      Abs Lymph Automated 4.10 x10 3/uL      Abs Mono Automated 0.63 x10 3/uL      Abs Eos Automated 0.18 x10 3/uL      Absolute Baso Automated 0.06 x10 3/uL      Absolute Immature Granulocyte 0.04 x10 3/uL      Absolute NRBC 0.00 x10 3/uL           Imaging personally reviewed, including:    Safety Checklist  DVT prophylaxis:  CHEST guideline (See page [id number]) Chemical   Foley:  [place] Rn Foley protocol Not present   IVs:  Peripheral IV   PT/OT: Not needed   Daily CBC & or Chem ordered:  SHM/ABIM guidelines (see #5) Yes, due to clinical and lab instability   Reference for approximate charges of common labs: CBC auto diff - $76  BMP - $99  Mg - $79    Signed by: Joylene Draft, MD   cc:Pcp, Octaviano Glow, MD

## 2017-02-03 ENCOUNTER — Observation Stay: Payer: Charity

## 2017-02-03 ENCOUNTER — Encounter (INDEPENDENT_AMBULATORY_CARE_PROVIDER_SITE_OTHER): Payer: Self-pay

## 2017-02-03 DIAGNOSIS — E782 Mixed hyperlipidemia: Secondary | ICD-10-CM

## 2017-02-03 DIAGNOSIS — I10 Essential (primary) hypertension: Secondary | ICD-10-CM

## 2017-02-03 LAB — HEMOGLOBIN A1C
Average Estimated Glucose: 208.7 mg/dL
Hemoglobin A1C: 8.9 % — ABNORMAL HIGH (ref 4.6–5.9)

## 2017-02-03 LAB — GLUCOSE WHOLE BLOOD - POCT
Whole Blood Glucose POCT: 345 mg/dL — ABNORMAL HIGH (ref 70–100)
Whole Blood Glucose POCT: 286 mg/dL — ABNORMAL HIGH (ref 70–100)
Whole Blood Glucose POCT: 248 mg/dL — ABNORMAL HIGH (ref 70–100)
Whole Blood Glucose POCT: 279 mg/dL — ABNORMAL HIGH (ref 70–100)

## 2017-02-03 LAB — BASIC METABOLIC PANEL
BUN: 22 mg/dL — ABNORMAL HIGH (ref 7.0–19.0)
BUN: 18 mg/dL (ref 7.0–19.0)
CO2: 18 mEq/L — ABNORMAL LOW (ref 22–29)
CO2: 24 mEq/L (ref 22–29)
Calcium: 9.3 mg/dL (ref 8.5–10.5)
Calcium: 9.4 mg/dL (ref 8.5–10.5)
Chloride: 104 mEq/L (ref 100–111)
Chloride: 103 mEq/L (ref 100–111)
Creatinine: 1.4 mg/dL — ABNORMAL HIGH (ref 0.6–1.0)
Creatinine: 1.1 mg/dL — ABNORMAL HIGH (ref 0.6–1.0)
Glucose: 511 mg/dL (ref 70–100)
Glucose: 336 mg/dL — ABNORMAL HIGH (ref 70–100)
Potassium: 4.3 mEq/L (ref 3.5–5.1)
Potassium: 3.7 mEq/L (ref 3.5–5.1)
Sodium: 133 mEq/L — ABNORMAL LOW (ref 136–145)
Sodium: 136 mEq/L (ref 136–145)

## 2017-02-03 LAB — CK: Creatine Kinase (CK): 165 U/L (ref 29–168)

## 2017-02-03 LAB — GFR
EGFR: 47.6
EGFR: >60

## 2017-02-03 LAB — LIPID PANEL
Cholesterol / HDL Ratio: 3.2
Cholesterol: 128 mg/dL (ref 0–199)
HDL: 40 mg/dL (ref 40–9999)
LDL Calculated: 64 mg/dL (ref 0–99)
Triglycerides: 121 mg/dL (ref 34–149)
VLDL Calculated: 24 mg/dL (ref 10–40)

## 2017-02-03 LAB — MAGNESIUM: Magnesium: 1.8 mg/dL (ref 1.6–2.6)

## 2017-02-03 LAB — TROPONIN I: Troponin I: 0.01 ng/mL (ref 0.00–0.09)

## 2017-02-03 LAB — HEMOLYSIS INDEX: Hemolysis Index: 4 (ref 0–18)

## 2017-02-03 MED ORDER — HYDRALAZINE HCL 20 MG/ML IJ SOLN
10.00 mg | Freq: Four times a day (QID) | INTRAMUSCULAR | Status: DC | PRN
Start: 2017-02-03 — End: 2017-02-04
  Filled 2017-02-03: qty 1

## 2017-02-03 MED ORDER — CHLORTHALIDONE 25 MG PO TABS
25.00 mg | ORAL_TABLET | Freq: Every day | ORAL | 0 refills | Status: DC
Start: 2017-02-03 — End: 2017-02-08

## 2017-02-03 MED ORDER — INSULIN NPH (HUMAN) (ISOPHANE) 100 UNIT/ML SC SUSP
20.00 [IU] | Freq: Two times a day (BID) | SUBCUTANEOUS | Status: DC
Start: 2017-02-04 — End: 2017-02-04
  Administered 2017-02-04: 09:00:00 20 [IU] via SUBCUTANEOUS
  Filled 2017-02-03: qty 0.2

## 2017-02-03 MED ORDER — INSULIN GLARGINE 100 UNIT/ML SC SOLN
40.00 [IU] | SUBCUTANEOUS | Status: AC
Start: 2017-02-03 — End: 2017-02-03
  Administered 2017-02-03: 17:00:00 40 [IU] via SUBCUTANEOUS
  Filled 2017-02-03: qty 1

## 2017-02-03 MED ORDER — INSULIN LISPRO 100 UNIT/ML SC SOLN
1.00 [IU] | Freq: Three times a day (TID) | SUBCUTANEOUS | Status: DC | PRN
Start: 2017-02-03 — End: 2017-02-04
  Administered 2017-02-04: 08:00:00 5 [IU] via SUBCUTANEOUS

## 2017-02-03 MED ORDER — OXYCODONE HCL 5 MG PO TABS
5.00 mg | ORAL_TABLET | Freq: Four times a day (QID) | ORAL | Status: DC | PRN
Start: 2017-02-03 — End: 2017-02-04
  Administered 2017-02-03: 5 mg via ORAL
  Filled 2017-02-03 (×2): qty 1

## 2017-02-03 MED ORDER — ONDANSETRON HCL 4 MG/2ML IJ SOLN
4.00 mg | Freq: Three times a day (TID) | INTRAMUSCULAR | Status: DC | PRN
Start: 2017-02-03 — End: 2017-02-04
  Administered 2017-02-03 (×2): 4 mg via INTRAVENOUS
  Filled 2017-02-03 (×2): qty 2

## 2017-02-03 MED ORDER — INSULIN LISPRO 100 UNIT/ML SC SOLN
1.00 [IU] | Freq: Every evening | SUBCUTANEOUS | Status: DC | PRN
Start: 2017-02-03 — End: 2017-02-04
  Administered 2017-02-03: 22:00:00 2 [IU] via SUBCUTANEOUS
  Filled 2017-02-03: qty 6

## 2017-02-03 MED ORDER — PANTOPRAZOLE SODIUM 40 MG PO TBEC
40.00 mg | DELAYED_RELEASE_TABLET | Freq: Every morning | ORAL | 0 refills | Status: DC
Start: 2017-02-04 — End: 2017-02-04

## 2017-02-03 MED ORDER — GABAPENTIN 100 MG PO CAPS
100.00 mg | ORAL_CAPSULE | Freq: Three times a day (TID) | ORAL | Status: DC
Start: 2017-02-03 — End: 2017-02-04
  Administered 2017-02-03 – 2017-02-04 (×4): 100 mg via ORAL
  Filled 2017-02-03 (×4): qty 1

## 2017-02-03 MED ORDER — INSULIN GLARGINE 100 UNIT/ML SC SOLN
40.00 [IU] | SUBCUTANEOUS | Status: DC
Start: 2017-02-03 — End: 2017-02-03

## 2017-02-03 MED ORDER — MORPHINE SULFATE 2 MG/ML IJ/IV SOLN (WRAP)
4.0000 mg | Status: DC | PRN
Start: 2017-02-03 — End: 2017-02-04
  Administered 2017-02-03 (×2): 4 mg via INTRAVENOUS
  Filled 2017-02-03 (×2): qty 2

## 2017-02-03 MED ORDER — METOCLOPRAMIDE HCL 5 MG PO TABS
5.00 mg | ORAL_TABLET | Freq: Four times a day (QID) | ORAL | 0 refills | Status: DC
Start: 2017-02-03 — End: 2017-02-04

## 2017-02-03 MED ORDER — INSULIN LISPRO 100 UNIT/ML SC SOLN
8.00 [IU] | Freq: Three times a day (TID) | SUBCUTANEOUS | Status: DC
Start: 2017-02-03 — End: 2017-02-04
  Administered 2017-02-03 – 2017-02-04 (×2): 8 [IU] via SUBCUTANEOUS
  Filled 2017-02-03 (×2): qty 3

## 2017-02-03 MED ORDER — GABAPENTIN 100 MG PO CAPS
100.00 mg | ORAL_CAPSULE | Freq: Three times a day (TID) | ORAL | 0 refills | Status: DC
Start: 2017-02-03 — End: 2017-02-04

## 2017-02-03 MED ORDER — AMLODIPINE BESYLATE 5 MG PO TABS
5.00 mg | ORAL_TABLET | Freq: Every day | ORAL | Status: DC
Start: 2017-02-03 — End: 2017-02-04
  Administered 2017-02-03 – 2017-02-04 (×2): 5 mg via ORAL
  Filled 2017-02-03 (×2): qty 1

## 2017-02-03 MED ORDER — METOPROLOL TARTRATE 25 MG PO TABS
25.00 mg | ORAL_TABLET | Freq: Two times a day (BID) | ORAL | Status: DC
Start: 2017-02-03 — End: 2017-02-04
  Administered 2017-02-03 – 2017-02-04 (×2): 25 mg via ORAL
  Filled 2017-02-03 (×3): qty 1

## 2017-02-03 NOTE — Consults (Signed)
ENDOCRINE NEW CONSULT    Date Time: 02/03/17 3:18 PM  Patient Name: [name] [name],[name] [name]  Requesting Physician: Arizona Constable, MD  Consulting Physician: Clyde Canterbury, MD  Admission Date: 02/02/2017    Primary Care Physician: Christa See, MD    Endocrinology Consultation for: DM  Payor: /  None    Assessment/Recommendations:   [name] [name] is a 51 y.o. AAW with uncontrolled T2DM (HgA1c of 8.9%), HTN, CVA in 2012, PUD, OSA, bulging lumbar disc, and obesity.  DM complicated by gastroparesis and neuropathy.  Yesterday she received 125 mg of methylprednisolone.  Home regimen:  Novolog 70/30 50/60/70 (often missing the breakfast dose).  Nausea and vomiting recently.      Recommend Lantus 40 u x once   Recommend NPH 20 u BID tomorrow   Recommend Humalog 8 u q AC, HOLD if NPO   Recommend medium dose correction q AC and low dose q hs   poc glucose q AC/HS   Concerns for acromegaly, thus will check a IGF1 level   Recommend treated her OSA, as this will help to improve her BG as well   Discussed she will need to transition to NPH-Reg 70/30 from [employer] and obtain [employer] glucmeter (ReliOn) brand with test strips.    Recommend hospital follow up in TCM clinic with referral to endocrinology.     Arizona Constable, MD, thank you for this consultation.  We will follow the patient with you during this hospitalization.  Please contact me with any questions or issues.    Clyde Canterbury, MD  [facility]  [phone] or Tiger Text or pager ID: [phone]   Hours Mon. - Friday 8- 5:00pm  On nights, holidays and weekends please page the endocrine consult pager [phone]) for emergencies.     History of Presenting Illness:   [name] [name] is a 51 y.o. female with h/o T2DM (HgA1c of 8.9%), HTN, CVA in 2012, PUD, OSA, bulging lumbar disc, Obesity who was admitted to the hospital on 02/02/2017 with chest pain, SOB and abdominal pain.  [name] [name] was diagnosed with T2DM at age 68 and previously  followed at [facility] by Dr. Daiva Nakayama.  Her diabetes is complicated by peripheral neuropathy, nephropathy, and gastroparesis.  In October 2017 her HgA1c was 9%.  In the past she wast treated with metformin which was stopped after she developed GI upset.  Next she tried Actos which was stopped for heart palpitations.  She was started on Novolog 70/30 75 u q AM, with financial contraints she started   rationing insulin by taking 60 u with lunch and 60 u with dinner.  She reports recent polyuria, mild polydipsia, and 60-70lb weight loss over the past year.  She denies enlargement of her hands or feet, but has noted occasional swelling of her hands feet.  She chronic numbness in a sock pattern bilaterally, worsening under-bite, daytime fatigue and somnolence.  Two or three years ago she was diagnosed with OSA and prescribed either a CPAP mask, but did not have the insurance to cover the cost.  She reports a strong family history of diabetes with her father and brother.  [place] and plans to have health insurance by February 2019 with her new job.   She is not been checking blood sugars recently since her glucometer is broken.    Past Medical History:     Past Medical History:   Diagnosis Date   .  Diabetes mellitus    . Hypertension        Past Surgical History:   History reviewed. No pertinent surgical history.    Social History:   Patient  reports that she has never smoked. She has never used smokeless tobacco.    Family History:   Patient reports family history is not on file.     Allergies:     Allergies   Allergen Reactions   . Tramadol Palpitations   . Lisinopril Swelling   . Penicillins Nausea And Vomiting   . Valium [Diazepam] Other (See Comments)     dizziness       Medications:     Prescriptions Prior to Admission   Medication Sig   . ALLOPURINOL PO Take 1 tablet by mouth daily.       [name] AmLODIPine Besylate (NORVASC PO) Take 1 tablet by mouth  daily.   . ATORVASTATIN CALCIUM PO Take 1 tablet by mouth daily.   . DULoxetine (CYMBALTA) 60 MG capsule Take 60 mg by mouth 2 (two) times daily.   . Insulin Aspart Prot & Aspart (NOVOLOG MIX 70/30 SC) Inject into the skin.50 units in the morning, 60 units at lunch time, and 70 units at dinner time.   . metoprolol tartrate (LOPRESSOR) 25 MG tablet Take 25 mg by mouth daily.   . [DISCONTINUED] METOCLOPRAMIDE HCL PO Take 1 tablet by mouth daily.   . [DISCONTINUED] RANITIDINE HCL PO Take 1 tablet by mouth daily.           Current Facility-Administered Medications   Medication Dose Route Frequency   . amLODIPine  5 mg Oral Daily   . DULoxetine  60 mg Oral Q12H SCH   . enoxaparin  40 mg Subcutaneous Daily   . gabapentin  100 mg Oral Q8H SCH   . insulin NPH-regular 70-30 MIXTURE  50 Units Subcutaneous TID AC   . metoclopramide  5 mg Intravenous TID MEALS   . metoprolol tartrate  25 mg Oral Q12H SCH   . pantoprazole  40 mg Oral QAM [facility]        Available old records reviewed, including:  Epic.  Review of Systems:   All other systems were reviewed and are negative except per HPI.      Physical Exam:   Temp:  [97.3 F (36.3 C)-97.9 F (36.6 C)] 97.9 F (36.6 C)  Heart Rate:  [65-117] 116  Resp Rate:  [16-20] 18  BP: (120-178)/(59-102) 137/59   Wt Readings from Last 3 Encounters:   02/02/17 133.8 kg (295 lb)     Body mass index is 47.61 kg/m.    Intake/Output Summary (Last 24 hours) at 02/03/17 1518  Last data filed at 02/02/17 1653   Gross per 24 hour   Intake             1000 ml   Output                0 ml   Net             1000 ml     General: morbidly obese woman who is awake, alert, oriented x 3; no acute distress.  HEENT: perrla, eomi, sclera anicteric oropharynx clear without lesions, mucous membranes moist, mild prognathism   Neck: supple, no lymphadenopathy, no thyromegaly, no JVD, no carotid bruits  Cardiovascular: regular rate and rhythm, no murmurs, rubs or gallops  Lungs: clear to auscultation bilaterally,  without wheezing, rhonchi, or rales  Abdomen: soft, non-tender,  non-distended; no palpable masses, no hepatosplenomegaly, normoactive bowel sounds, no rebound or guarding  Extremities: no clubbing, cyanosis, or edema  Neuro: A+O x 3, cranial nerves grossly intact, strength 5/5 in upper and lower extremities, sensation intact,   Skin: acanthosis nigricans of the next.  Skin tags appreciated around the axilla.     Labs:     POCT - Glucose Whole blood   Date/Time Value Ref Range Status   02/03/2017 1312 286 (H) 70 - 100 mg/dL Final   16/12/9602 5409 345 (H) 70 - 100 mg/dL Final   81/19/1478 2956 366 (H) 70 - 100 mg/dL Final     Comment:     RN Notified       Hemoglobin A1C (%)   Date Value   02/03/2017 8.9 (H)       Recent Labs      02/02/17   1354   WBC  11.19*   Hgb  10.5*   Hematocrit  33.2*   Platelets  326       Recent Labs      02/03/17   0103  02/02/17   1354   Sodium  133*  136   Potassium  4.3  4.3   Chloride  104  106   CO2  18*  19*   BUN  22.0*  22.0*   Creatinine  1.4*  1.3*   Glucose  511*  241*   Calcium  9.3  10.0   Magnesium  1.8  1.4*       Recent Labs      02/02/17   1354   AST (SGOT)  17   ALT  14   Alkaline Phosphatase  105   Protein, Total  7.3   Albumin  3.3*       No results for input(s): PTT, PT, INR in the last 72 hours.    No results found for: TSH, T4FREE, T4, FREET3, T3      Signed by: Clyde Canterbury, MD    cc: Arizona Constable, MD  Christa See, MD

## 2017-02-03 NOTE — Consults (Addendum)
[facility] CONSULT[facility]    Date Time: 02/03/17 8:12 AM  Patient Name: [name] [name],[name] [name]  Requesting Physician: Arizona Constable, MD  Admission Date: 02/02/2017   Primary Cardiologist: None    Reason for Consultation:   Chest pain   History:   Armya Westerhoff is a 51 y.o. female with a history of HTN, DM, and PUD, who presented to the hospital on 02/02/2017 with chest pain, SOB, and generalized feelings of malaise. Last week she was diagnosed with PNA and given levo antibiotic that she has completed, she continues to not feel well with generalized aches, N/V. She has pleuritic chest discomfort, mid to right, that spreads intermittently, worse with movement and coughing. She feels her heart racing and SOB with activity. She has a sedentary job and doesn't do a lot of activity, she gets SOB after walking up a flight of stairs, this is unchanged over the past 6 months. She denies fever, N/V/D, palpitations, lightheadedness, syncope, PND, orthopnea, or new bilateral lower edema.    Past Medical History:   Sister with EF 10%, HF passed away early 56s.   Father with HF, passed away from stroke.  Past Medical History:   Diagnosis Date   . Diabetes mellitus    . Hypertension     History reviewed. No pertinent surgical history.   Family History:   History reviewed. No pertinent family history.  Social History:     Social History     Social History   . Marital status: Single     Spouse name: N/A   . Number of children: N/A   . Years of education: N/A     Social History Main Topics   . Smoking status: Never Smoker   . Smokeless tobacco: Never Used   . Alcohol use Not on file   . Drug use: Unknown   . Sexual activity: Not on file     Other Topics Concern   . Not on file     Social History Narrative   . No narrative on file      Allergies:   She is allergic to tramadol; lisinopril; penicillins; and valium [diazepam].  Medications:   Home Meds:   No current outpatient prescriptions on file.    Prescriptions Prior to  Admission   Medication Sig   . ALLOPURINOL PO Take 1 tablet by mouth daily.       [name] AmLODIPine Besylate (NORVASC PO) Take 1 tablet by mouth daily.   . ATORVASTATIN CALCIUM PO Take 1 tablet by mouth daily.   . DULoxetine (CYMBALTA) 60 MG capsule Take 60 mg by mouth 2 (two) times daily.   . Insulin Aspart Prot & Aspart (NOVOLOG MIX 70/30 SC) Inject into the skin.50 units in the morning, 60 units at lunch time, and 70 units at dinner time.   [name] METOCLOPRAMIDE HCL PO Take 1 tablet by mouth daily.   . metoprolol tartrate (LOPRESSOR) 25 MG tablet Take 25 mg by mouth daily.   [name] RANITIDINE HCL PO Take 1 tablet by mouth daily.          Scheduled Meds:     dilTIAZem 240 mg Oral Daily   DULoxetine 60 mg Oral Q12H SCH   enoxaparin 40 mg Subcutaneous Daily   influenza 0.5 mL Intramuscular Once   insulin NPH-regular 70-30 MIXTURE 50 Units Subcutaneous TID AC   metoclopramide 5 mg Intravenous TID MEALS   pantoprazole 40 mg Oral QAM AC     Continuous Infusions:  . lactated ringers  75 mL/hr at 02/02/17 2326       PRN Meds:  acetaminophen-codeine, Nursing communication: Adult Hypoglycemia Treatment Algorithm **AND** dextrose **AND** dextrose **AND** glucagon (rDNA), insulin lispro, ondansetron    Review of Systems:   All other systems were reviewed and negative except as mentioned above.    Physical Exam:   Vital Signs: BP 161/89   Pulse (!) 107   Temp 97.3 F (36.3 C) (Oral)   Resp 19   Ht 1.676 m (5\' 6" )   Wt 133.8 kg (295 lb)   SpO2 99%   BMI 47.61 kg/m      Intake/Output Summary (Last 24 hours) at 02/03/17 1610  Last data filed at 02/02/17 1653   Gross per 24 hour   Intake             1000 ml   Output                0 ml   Net             1000 ml     General Appearance:  Alert, uncomfortable-appearing female.   HEENT: Sclera anicteric, conjunctiva without pallor, moist mucous membranes, normal dentition.   Neck: Supple without jugular venous distention. Normal carotid upstroke without bruits.   Chest: Clear to  auscultation bilaterally with good air movement and respiratory effort and no wheezes, rales, or rhonchi. Reproducible chest pain  Cardiovascular: Normal S1 and S2 without murmurs, gallops or rub. PMI of normal size and nondisplaced.     Abdomen: Soft, tender, nondistended, with normoactive bowel sounds. No organomegaly.  No pulsatile masses, or bruits.   Extremities: Warm without edema, clubbing, or cyanosis. All peripheral pulses are full and equal.   Skin: Normal coloration and turgor, no rash, xanthoma or xanthelasma.   Musculoskeletal: no joint tenderness, deformity or swelling  Neuro: Alert and oriented x3. Grossly intact. Strength is symmetrical. Normal mood and affect.   Cardiographics:   ECG: 02/02/2017 ST 105 bpm, left axis deviation, 01/27/2017 NSR 97 bpm, left axis deviation  Telemetry: ST HR 100-130s, no arrhythmia   Echocardiogram: 04/2015 EF 60-65% no sig regurg  Labs Reviewed:     Recent Labs      02/02/17   1354   WBC  11.19*   Hgb  10.5*   Hematocrit  33.2*   Platelets  326     Recent Labs      02/03/17   0103  02/02/17   2115  02/02/17   1415  02/02/17   1354   Troponin I  0.01  0.01   --    --    i-STAT Troponin   --    --   0.00   --    B-Natriuretic Peptide   --    --    --   <10    Recent Labs      02/03/17   0103  02/02/17   1354   Sodium  133*  136   Potassium  4.3  4.3   CO2  18*  19*   BUN  22.0*  22.0*   Creatinine  1.4*  1.3*   Glucose  511*  241*   Magnesium  1.8  1.4*    Estimated Creatinine Clearance: 66.1 mL/min (A) (based on SCr of 1.4 mg/dL (H)).   No results for input(s): INR, TSH, CHOL, TRIG, HDL, LDL in the last 72 hours.     Rads:     Radiology Results (24 Hour)  Procedure Component Value Units Date/Time    CT Abd/ Pelvis with IV and PO Contrast [[id number]] Collected:  02/02/17 1651    Order Status:  Completed Updated:  02/02/17 1700    Narrative:       CT of the abdomen and pelvis with intravenous contrast    CLINICAL INFORMATION: Abdominal pain.    PROCEDURE: CT of abdomen  and pelvis was performed with intravenous  contrast. 100 cc of Omnipaque 350 was utilized for intravenous contrast.  Nonionic contrast was utilized. Automated tube current modulation and  statistical reconstruction techniques were utilized for dose  optimization.    Findings: A lesion is noted within the left adnexa measuring 5.1 cm  which is somewhat heterogeneous in attenuation but not simple cystic  with areas measuring approximately 60 Hounsfield units in attenuation.  There is no definite bowel pathology. Fatty infiltration of the liver is  noted. The spleen, adrenals, kidneys, and pancreas are unremarkable  appearing. The patient is status post cholecystectomy. The appendix is  not definitely visualized. There is no CT evidence of appendicitis.  Degenerative changes of the spine are noted with disc space narrowing at  L4-L5 and L5-S1, facet degenerative changes and minimal anterolisthesis  of L4 on L5.      Impression:        Indeterminate 5.1 cm left adnexal lesion, not simple cystic.  Recommend follow-up pelvic ultrasound.    Rocky Crafts, MD   02/02/2017 4:56 PM    US Venous Dopp Lower Deatra Canter [[id number]] Collected:  02/02/17 1631    Order Status:  Completed Updated:  02/02/17 1636    Narrative:       HISTORY: 51 year old with leg swelling and pain.    COMPARISON: None    FINDINGS: Duplex evaluation of the deep venous system of the right and  left lower extremities was performed.  The common femoral, femoral and  popliteal veins are visualized and appear unremarkable.  These veins are  compressible with no evidence of thrombus.  Doppler demonstrates patency  and normal directional and phasic flow.  Appropriate augmentation is  observed.  The proximal deep femoral veins are visualized and appear  unremarkable.      The portions of the posterior tibial and peroneal veins that are seen  appear unremarkable with no thrombus noted.  There is no indirect  evidence of calf vein thrombus in either leg.       Impression:         No evidence of deep venous thrombosis in the lower extremities.    Genevieve Norlander, MD   02/02/2017 4:32 PM    XR Chest 2 Views [[id number]] Collected:  02/02/17 1351    Order Status:  Completed Updated:  02/02/17 1355    Narrative:       CLINICAL HISTORY: ?PNA    COMPARISON: None    PA and lateral views of the chest were performed.    FINDINGS:     The lungs are well inflated and clear.  The cardiac silhouette,  pulmonary vascularity and mediastinum are unremarkable.  The visualized  bones are within normal limits for the patient's age.      Impression:         Normal chest film.    Annamary Carolin, MD   02/02/2017 1:51 PM        Assessment:   Shakia Sebastiano is a 51 y.o. female with a history of HTN, DM, CVA, and PUD, who presented to the  hospital on 02/02/2017 with chest pain and SOB.     - Atypical CP - mid to R sided, pleuritic, worse with movement, reproducible on exam, denies SOB currently, trop neg, CXR neg  - Possible viral illness - tachycardic, dehydrated, vomited x2 during hospital stay, has generalized feelings of malaise/achiness/chills, WBC 11 yesterday  - HTN uncontrolled  - DM uncontrolled A1C 8.9  - PUD  Plan:   - Supportive care for URI/gastroenteritis  - Discontinue Cardizem CD  - Resume home Lopressor 25 BID at discharge  - Resume home dose Norvasc 5 mg daily  - Add Chlorthalidone 25 mg daily for BP control  - Continue home dose lipitor  - Follow up 1 month with Dr. Leeroy Bock, would benefit from chem 7 in 3 weeks so ensure stable lytes on chlorthalidone, can reassess need for cardiac testing after she recovers from her current non-cardiac illness  - Needs f/u w/ PCP or Endocrine for DM management  - Ok to d/c home from a cardiac perspective      Signed by: Janina Mayo NP  [facility]   [facility] Spectralink [phone] or [phone] (M-F 8 am-5 pm)   Tyson Babinski [facility] Spectralink [phone]  (M-F 8 am-5 pm)  After Hours Consult Line: [phone]     cc:Pcp, Noneorunknown, MD    Patient seen and examined. History, physical and pertinent test results reviewed. Agree with above assessment and plan with the following addendums:  I edited the note above to reflect my thoughts and recommendations.    Signed by: Concha Norway, MD, [facility]

## 2017-02-03 NOTE — Plan of Care (Signed)
Problem: Safety  Goal: Patient will be free from injury during hospitalization  Outcome: Progressing   02/03/17 0430   Goal/Interventions addressed this shift   Patient will be free from injury during hospitalization  Assess patient's risk for falls and implement fall prevention plan of care per policy;Use appropriate transfer methods;Provide and maintain safe environment;Hourly rounding     Goal: Patient will be free from infection during hospitalization  Outcome: Progressing   02/03/17 0430   Goal/Interventions addressed this shift   Free from Infection during hospitalization Assess and monitor for signs and symptoms of infection;Monitor lab/diagnostic results;Monitor all insertion sites (i.e. indwelling lines, tubes, urinary catheters, and drains);Encourage patient and family to use good hand hygiene technique       Problem: Pain  Goal: Pain at adequate level as identified by patient  Outcome: Progressing   02/03/17 0430   Goal/Interventions addressed this shift   Pain at adequate level as identified by patient Identify patient comfort function goal;Assess for risk of opioid induced respiratory depression, including snoring/sleep apnea. Alert healthcare team of risk factors identified.;Assess pain on admission, during daily assessment and/or before any "as needed" intervention(s);Reassess pain within 30-60 minutes of any procedure/intervention, per Pain Assessment, Intervention, Reassessment (AIR) Cycle;Evaluate if patient comfort function goal is met;Evaluate patient's satisfaction with pain management progress;Offer non-pharmacological pain management interventions       Problem: Side Effects from Pain Analgesia  Goal: Patient will experience minimal side effects of analgesic therapy  Outcome: Progressing   02/03/17 0430   Goal/Interventions addressed this shift   Patient will experience minimal side effects of analgesic therapy Monitor/assess patient's respiratory status (RR depth, effort, breath  sounds);Assess for changes in cognitive function;Prevent/manage side effects per LIP orders (i.e. nausea, vomiting, pruritus, constipation, urinary retention, etc.)

## 2017-02-03 NOTE — Progress Notes (Signed)
[facility]    Received a referral to schedule a follow up appointment with the [facility].  Appointment scheduled for 02/15/2017 at 8:30 am with MD Lianne Bushy at the [facility] location.      Clinic address is as follows:    [address]. [place], [address]    Please notify patient to arrive 15 minutes early to the appointment and bring the following materials with them:    Insurance card (if insured) and photo ID  Medications in their original bottles  Glucometer/blood sugar log (if diabetic)  Weight log (if heart failure)  Proof of income (to enroll in medication assistance programs-first two pages of signed 1040 tax forms or last 2 months of pay stubs)        [facility] [employer] [facility]  [address]   [address]  [place], [place] [address]  [phone]  [phone] (F)

## 2017-02-03 NOTE — Progress Notes (Signed)
Echo tech attempted to perform study twice, pt unavailable due to ultrasound. She is leaving the floor and if echo is performed it may not be until late this evening. NP made aware

## 2017-02-03 NOTE — Consults (Signed)
[facility]  CONSULTATION NOTE  [facility] call: [phone], [phone]  [facility] call: [phone]  After hours call [phone]        Date Time: 02/03/17 5:16 PM  Patient Name: [name] [name],[name] [name]  Requesting Physician: Arizona Constable, MD       Reason for Consultation:   Abdominal pain    Assessment and Plan:   Assessment:  1. Abdominal pain: Pt reports it feels like her usual gastroparesis pain which has previously improved with Reglan; of note, her blood sugars were >500 upon admission. CT Abdomen/Pelvis 02/02/17 with left adnexal lesion, no other abnormalities. Pelvis US 02/03/17 with fibroid uterus and left uterine fibroid corresponds to the adnexal mass seen on the CT. LFTs normal 02/02/17. Reportedly has a history of PUD 6 years ago, though the pt notes the current pain is not the same. NM study from 2017 was negative for delayed gastric emptying, though notes has had prior study showing this.  2. Chest pain: Cardiology following, and per their note, agreeable with discharge home from a cardiac perspective.  3. Poorly controlled diabetes: A1c 8.9 (02/03/17). Glucose 511 mg/dL this morning.    Plan:  1. Recommend continuation of scheduled Reglan 5mg  IV tid.  2. No plans for endoscopic intervention at this time. Could consider upper endoscopy as an outpatient.  3. When diet re-initiated, slow advancement, and would recommend gastroparesis diet.    History:   [name] [name] is a 51 y.o. female who presents to the hospital on 02/02/2017 with chest pain, shortness of breath, malaise, and abdominal pain. GI is consulted for the epigastric abdominal pain, nausea and vomiting.    The pt reports she went to the hospital last week for chest pain and she was diagnosed with pneumonia. She reports she took a 5 day course of Levaquin, which finished two days ago. The pt states her abdominal pain began on Monday 01/31/17. She states it was initially intermittent, but the last two days it has been  constant. She reports it is cramping pain, almost like she is about to have a menstrual period. The pt states she has had this same pain before in the past with her gastroparesis. She states she had an EGD 6 years ago significant for gastric ulcers, but this pain is different. She denies any change with PO intake. She reports usually having diarrhea at present, she reports feeling better after receiving morphine.    The pt reports sick contacts with her nephew. She notes cold-like symptoms after contact with him. She also notes abdominal pain, N/V that onset after the cold symptoms.    Past Medical History:     Past Medical History:   Diagnosis Date   . Diabetes mellitus    . Hypertension        Past Surgical History:   History reviewed. No pertinent surgical history.    Family History:   History reviewed. No pertinent family history.    Social History:     Social History     Social History   . Marital status: Single     Spouse name: N/A   . Number of children: N/A   . Years of education: N/A     Social History Main Topics   . Smoking status: Never Smoker   . Smokeless tobacco: Never Used   . Alcohol use Not on file   . Drug use: Unknown   . Sexual activity: Not on file     Other Topics Concern   .  Not on file     Social History Narrative   . No narrative on file       Allergies:     Allergies   Allergen Reactions   . Tramadol Palpitations   . Lisinopril Swelling   . Penicillins Nausea And Vomiting   . Valium [Diazepam] Other (See Comments)     dizziness       Medications:     Current Facility-Administered Medications   Medication Dose Route Frequency   . amLODIPine  5 mg Oral Daily   . DULoxetine  60 mg Oral Q12H SCH   . enoxaparin  40 mg Subcutaneous Daily   . gabapentin  100 mg Oral Q8H SCH   . insulin lispro  8 Units Subcutaneous TID AC   . [START ON 02/04/2017] insulin NPH  20 Units Subcutaneous BID AC   . metoclopramide  5 mg Intravenous TID MEALS   . metoprolol tartrate  25 mg Oral Q12H SCH   . pantoprazole   40 mg Oral QAM AC       Review of Systems:   General:  Patient denies lack of appetite, night sweats, weight loss, fatigue, fever.   HEENT:  Patient denies headache, hoarseness   Cardiovascular:  +chest pain.   Respiratory:  Patient denies chronic cough, difficulty breathing, wheezing.   Genitourinary:  Patient denies blood in urine, dark urine  Musculoskeletal: +BLE pains.   Skin:  Patient denies itching, rash.   Neurologic:  Patient denies dizziness, loss of consciousness, fainting, confusion  Heme/Lymphatic:  Patient denies easy bruising.       Pertinent positives noted in HPI.    Physical Exam:     Vitals:    02/03/17 1631   BP: 124/69   Pulse: 97   Resp: 18   Temp:    SpO2: 98%       General appearance: Well developed, well nourished, appears stated age and in NAD  Eyes: Sclera anicteric, pink conjunctivae, no ptosis  ENMT: mucous membranes moist, nose and ears appear normal.    Chest: Non labored respirations, no audible wheezing  CV:  Regular rate and rhythm  Abdomen: soft, minimal generalized TTP without rebound or guarding, non-distended, no masses  Skin: Normal color and turgor, no rashes, no suspicious skin lesions noted  Neuro: CN II-XII grossly intact.  No gross movement disorders noted.  Mental status: Appropriate affect, alert and oriented x 3    Labs Reviewed:     Recent Labs      02/02/17   1354   WBC  11.19*   Hgb  10.5*   Hematocrit  33.2*   Platelets  326   MCV  89.5       Recent Labs      02/03/17   1431  02/03/17   0103  02/02/17   1354   Sodium  136  133*  136   Potassium  3.7  4.3  4.3   Chloride  103  104  106   CO2  24  18*  19*   BUN  18.0  22.0*  22.0*   Creatinine  1.1*  1.4*  1.3*   Glucose  336*  511*  241*   Calcium  9.4  9.3  10.0   Magnesium   --   1.8  1.4*       Recent Labs      02/02/17   1354   AST (SGOT)  17   ALT  14  Alkaline Phosphatase  105   Bilirubin, Total  0.5   Protein, Total  7.3   Albumin  3.3*       No results for input(s): PTT, PT, INR in the last 72 hours.      Radiology:   Radiological Procedure reviewed:    Pelvis US 02/03/17:  Impression:    1. Fibroid uterus. Pedunculated left uterine fibroid corresponds to the  adnexal mass seen on CT scan.  2. Nonvisualized ovaries without adnexal mass identified.       CT Abdomen/Pelvis with IV and PO Contrast 02/02/17:   PROCEDURE: CT of abdomen and pelvis was performed with intravenous  contrast. 100 cc of Omnipaque 350 was utilized for intravenous contrast.  Nonionic contrast was utilized. Automated tube current modulation and  statistical reconstruction techniques were utilized for dose  optimization.    Findings: A lesion is noted within the left adnexa measuring 5.1 cm  which is somewhat heterogeneous in attenuation but not simple cystic  with areas measuring approximately 60 Hounsfield units in attenuation.  There is no definite bowel pathology. Fatty infiltration of the liver is  noted. The spleen, adrenals, kidneys, and pancreas are unremarkable  appearing. The patient is status post cholecystectomy. The appendix is  not definitely visualized. There is no CT evidence of appendicitis.  Degenerative changes of the spine are noted with disc space narrowing at  L4-L5 and L5-S1, facet degenerative changes and minimal anterolisthesis  of L4 on L5.    IMPRESSION:    Indeterminate 5.1 cm left adnexal lesion, not simple cystic.  Recommend follow-up pelvic ultrasound.      NM Gastric Emptying Study (11/06/15):  Impression :   Normal solid gastric emptying study.

## 2017-02-03 NOTE — UM Notes (Signed)
CHANGED TO INPATIENT CLASS 02/03/17 1443 R/T NEEDING SECOND MIDNIGHT FOR HYPERGLYCEMIA.    BLOOD SUGARS - 511--345--286    PELVIC US-   1. Fibroid uterus. Pedunculated left uterine fibroid corresponds to the adnexal mass seen on CT scan.  2. Nonvisualized ovaries without adnexal mass identified.    ATTENDING-  1.  Chest pain  ? IMG consulted, appreciate recommendations  ? ECHO OP  ? Stop diltiazem upon d/c  ? Increase lopressor to 25mg  PO BID on d/c  ? Continue amlodipine 5mg  PO daily    2.  Abdominal pain  ? CT a/p showed a 5.1 adnexal lesion, not simple cystic  ? Transvaginal US showed fibroid uterus  ? OP GYN follow up   ? GI to consult, awaiting recommendations  ? Continue NPO status with meds    3. Uncontrolled DMII with hyperglycemia  ? A1C 8.9  ? NPH 70/30 50 units TID with meals  ? accu check achs  ? SSI  ? Endocrinology consulted, appreciate recommendations--paged Dr. Doristine Mango at ~[phone], awaiting call back    4. Hx ov CVA  ? noted    5. Nausea, Vomiting, hx of gastroparesis  ? Reglan 5mg  IV every 8 hours  ? Zofran 4mg  IV every 8 hours PRN    6.  Chronic pain d/t fibromyalgia  ? Oxycodone 5mg  PO every 6 hours PRN  ? Requests dilaudid IV, would refrain  ? Morphine 4mg  IV x 3 doses PRN, transition to oral once vomiting has ceased  ? OP Pain management initiation needed    7.  BLE pain, cramps  ? Improving some after IV magnesium  ? Venous Doppler US negative for DVT  ? Start Gabapentin 100mg  PO every 8 hours  ? Check CK level    8. AKI  ? Likely due to combination of HTN, and hyperglycemia  ? Continue IVF  ? BMP in AM  ? See #3 and #9    9.  HTN  ? Stop diltiazem  ? Metorprolol 25mg  PO BID  ? Resume amlodipine 5mg  daily  ? Start chlorthalidone 25mg  PO daily on discharge  ? Hydralazine 10mg  IV every 6 hours PRN for SBP >180    DVT PPX: lovenox, SCDs  Code: Full  Diet: NPO    Chest Pain - Clinical Indications for Admission to Inpatient Care    Verdia Kuba RN, BSN  Utilization Review Case Manager  [place]  [facility]  UR Case Mgmt.  [phone]- VM-Secure

## 2017-02-03 NOTE — UM Notes (Signed)
51 YEAR OLD FEMALE CAME TO [facility] WITH C/O ABD  AND CHEST PAIN, LEG PAIN, SOB. RECURRENT PAIN BEGAN YESTERDAY WITH N/V. CALLED AMBULANCE WHEN SHE DEVELOPED CHEST TIGHTNESS AND SOB. IN ER, HAD WHEEZING ON EXAM AND RECEIVED SOLUMEDROL AND NEBS.     VS- BP- 186/89, P- 114, R- 20, T- 97.7  LOP 5-10/10    LABS- WBC- 11.19, BUN 22, CR 1.3, GLUCOSE 241--366--511, TROP 0.00--0.01    CXR- NO ACUTE PROCESS  BLE DOPPLERS- No evidence of deep venous thrombosis in the lower extremities  CT A/P-  Indeterminate 5.1 cm left adnexal lesion, not simple cystic. Recommend follow-up pelvic ultrasound.    PMH- DM, HTN    PLAN- ADMIT TO OBSERVATION CLASS 02/02/17 1723    OBS    SERIAL CE, CARDS CONSULT, ECHO, NEB TREATMENTS, GI CONSULT, NPO AFTER MN, PPI, MONITOR LABS, LOVENOX PROPH    Place (admit) on Observation Services (Order #[id number]) on 02/02/17    Chest Pain: Observation Care - Observation Care Admission Criteria        Verdia Kuba RN, BSN  Utilization Review Case Manager  [facility]  UR Case Mgmt.  [phone]- VM-Secure

## 2017-02-03 NOTE — Plan of Care (Signed)
Problem: Safety  Goal: Patient will be free from injury during hospitalization  Outcome: Progressing   02/03/17 0430   Goal/Interventions addressed this shift   Patient will be free from injury during hospitalization  Assess patient's risk for falls and implement fall prevention plan of care per policy;Use appropriate transfer methods;Provide and maintain safe environment;Hourly rounding     Goal: Patient will be free from infection during hospitalization  Outcome: Progressing   02/03/17 0430   Goal/Interventions addressed this shift   Free from Infection during hospitalization Assess and monitor for signs and symptoms of infection;Monitor lab/diagnostic results;Monitor all insertion sites (i.e. indwelling lines, tubes, urinary catheters, and drains);Encourage patient and family to use good hand hygiene technique       Problem: Pain  Goal: Pain at adequate level as identified by patient  Outcome: Progressing   02/03/17 1451   Goal/Interventions addressed this shift   Pain at adequate level as identified by patient Identify patient comfort function goal;Assess for risk of opioid induced respiratory depression, including snoring/sleep apnea. Alert healthcare team of risk factors identified.;Assess pain on admission, during daily assessment and/or before any "as needed" intervention(s);Reassess pain within 30-60 minutes of any procedure/intervention, per Pain Assessment, Intervention, Reassessment (AIR) Cycle;Evaluate if patient comfort function goal is met;Evaluate patient's satisfaction with pain management progress;Offer non-pharmacological pain management interventions;Consult/collaborate with Pain Service;Consult/collaborate with Physical Therapy, Occupational Therapy, and/or Speech Therapy;Include patient/patient care companion in decisions related to pain management as needed       Problem: Side Effects from Pain Analgesia  Goal: Patient will experience minimal side effects of analgesic therapy  Outcome:  Progressing   02/03/17 0430   Goal/Interventions addressed this shift   Patient will experience minimal side effects of analgesic therapy Monitor/assess patient's respiratory status (RR depth, effort, breath sounds);Assess for changes in cognitive function;Prevent/manage side effects per LIP orders (i.e. nausea, vomiting, pruritus, constipation, urinary retention, etc.)       Problem: Discharge Barriers  Goal: Patient will be discharged home or other facility with appropriate resources  Outcome: Progressing   02/03/17 1451   Goal/Interventions addressed this shift   Discharge to home or other facility with appropriate resources Provide appropriate patient education;Provide information on available health resources;Initiate discharge planning       Problem: Psychosocial and Spiritual Needs  Goal: Demonstrates ability to cope with hospitalization/illness  Outcome: Progressing   02/03/17 1451   Goal/Interventions addressed this shift   Demonstrates ability to cope with hospitalizations/illness Encourage verbalization of feelings/concerns/expectations;Provide quiet environment;Assist patient to identify own strengths and abilities;Encourage patient to set small goals for self;Encourage participation in diversional activity;Reinforce positive adaptation of new coping behaviors;Include patient/ patient care companion in decisions;Communicate referral to spiritual care as appropriate       Problem: Moderate/High Fall Risk Score >5  Goal: Patient will remain free of falls  Outcome: Progressing   02/03/17 1400   OTHER   High (Greater than 13) HIGH-Initiate use of floor mats as appropriate;HIGH-Apply yellow "Fall Risk" arm band;HIGH-Activate bed/chair exit alarm where available       Problem: Chest Pain  Goal: Vital signs and cardiac rhythm stable  Outcome: Progressing   02/03/17 1451   Goal/Interventions addressed this shift   Vital signs and cardiac rhythm stable Monitor Earnest Bailey vital signs/cardiac rhythms;Monitor  labs;Assess the need for oxygen therapy and administer as ordered     Goal: Cardiac pain management  Outcome: Progressing   02/03/17 1451   Goal/Interventions addressed this shift   Cardiac pain management  Assess/report chest  pain/or related discomfort to LIP immediately;Instruct patient to report any change in pain status;Assess pain/or related discomfort on admission, during daily assessment, before and after any intervention;Include patient and patient care companion in decisions related to pain management       Comments:   Adult Observation Progress Note      Shift Note: Patient received from outgoing RN. VSS and A&Ox4. Pt complaining of generalized pain and abdomen pain, give PRN morphine. Pt experiencing episodes of emesis, give PRN Zofran  IV in place, LR running 75 mL/hr. Tele in place with ST. Bed in lowest position, fall mats in place, and call light within reach. Will continue to assess.    BM during shift: n/a    Pending Orders: Endocrine consult, GI consult    Discharge Plan: TBD    Social/Family Visits: n/a      Vitals:    02/03/17 0720 02/03/17 1002 02/03/17 1313 02/03/17 1350   BP: 161/89  (!) 168/102 137/59   Pulse: (!) 107 (!) 107 (!) 116    Resp: 19  20 18    Temp: 97.3 F (36.3 C)  97.9 F (36.6 C)    TempSrc: Oral  Oral    SpO2: 99%  99% 99%   Weight:       Height:           Patient Lines/Drains/Airways Status    Active Lines, Drains and Airways     Name:   Placement date:   Placement time:   Site:   Days:    Peripheral IV 02/02/17 Right Antecubital  02/02/17    1354    Antecubital    1

## 2017-02-03 NOTE — Plan of Care (Addendum)
Problem: Discharge Barriers  Goal: Patient will be discharged home or other facility with appropriate resources  Outcome: Progressing   02/03/17 0433   Goal/Interventions addressed this shift   Discharge to home or other facility with appropriate resources Provide appropriate patient education;Provide information on available health resources;Initiate discharge planning       Problem: Psychosocial and Spiritual Needs  Goal: Demonstrates ability to cope with hospitalization/illness  Outcome: Progressing   02/03/17 0433   Goal/Interventions addressed this shift   Demonstrates ability to cope with hospitalizations/illness Encourage verbalization of feelings/concerns/expectations;Provide quiet environment;Assist patient to identify own strengths and abilities;Encourage patient to set small goals for self       Problem: Moderate/High Fall Risk Score >5  Goal: Patient will remain free of falls  Outcome: Progressing   02/03/17 0433   OTHER   High (Greater than 13) HIGH-([facility] Only) Keep door open for better visability;HIGH-([facility] Only) Consider Safety CNA       Problem: Chest Pain  Goal: Vital signs and cardiac rhythm stable  Outcome: Progressing   02/03/17 0433   Goal/Interventions addressed this shift   Vital signs and cardiac rhythm stable Monitor [name] [name] vital signs/cardiac rhythms;Monitor labs;Assess the need for oxygen therapy and administer as ordered     Goal: Cardiac pain management  Outcome: Progressing   02/03/17 0433   Goal/Interventions addressed this shift   Cardiac pain management  Assess/report chest pain/or related discomfort to LIP immediately;Instruct patient to report any change in pain status;Assess pain/or related discomfort on admission, during daily assessment, before and after any intervention;Include patient and patient care companion in decisions related to pain management     Goal: Anxiety management/effective coping  Outcome: Completed Date Met: 02/03/17   02/03/17 0433   Goal/Interventions addressed  this shift   Anxiety management/effective coping  Assess/report uncontrolled anxiety, depression or ineffective coping to LIP;Offer reassurance to decrease anxiety     Goal: Patient/Patient Care Companion demonstrates understanding of disease process, treatment plan, medications, and discharge plan  Outcome: Completed Date Met: 02/03/17   02/03/17 0433   Goal/Interventions addressed this shift   Patient/Patient Care Companion demonstrates understanding of disease process, treatment plan, medications and discharge plan  Educate patient to immediately  r activity level and to avoid Valsalva;Reinforce regarding cardiac diet and any fluid parameters     Patient came by stretcher form  ED to the floor around 1930 , upon arrival patient alert and oriented x 4, oriented to the room, bed positioned low, call light within reach, patient with unsteady gait, (at a time uses a cane or a walker at home)safety precaution in place, floor mat in place, bed alarm activated, patient complain pain, PRN IV Dilaudid given x 1 with good pain releif, no side effects reported, patient calls for help before getting out of bed, admission instructions given, questions answered, patient verbalized understanding. We will continue with the plan of care.

## 2017-02-03 NOTE — Progress Notes (Signed)
MEDICINE PROGRESS NOTE    Date Time: 02/03/17 1:16 PM  Patient Name: [name] [name], [name] [name]  ZOX:[id number]  [id number]  Attending Physician: Arizona Constable, MD    CC: abdominal pain, chest pain, leg cramps    Interval History/24 hour events: [name] [name] is a 51 y.o. female admitted 02/02/2017 to observation for chest, abdomen, and leg pain.  Serial troponins were negative.  EKG non-ischemic.  Tachycardic with activity.  IMG consulted and recommended d/cing diltiazem upon discharge and resuming lopressor 25mg  PO BID and norvasc 5mg  daily as well as starting chlorthalidone 25mg  daily for BP control. ECHO can't be done until late tonight, can be done as an outpatient. Endocrinology consulted for a1c of 8.9 and elevated BG.  Awaiting recommendations.  For now, continues 70/30 50units TID with meals and SSI.  Creatinine increased to 1.4 today, up from 1.3 yesterday.  Per Care everywhere notes, creatinine was 0.8 earlier this month.  Could be elevated d/t hyperglycemia, HTN, and possibly dehydration d/t n/v.  Repeat BMP in AM.  GI consulted, awaiting recommendations.  Pt c/o of epigastric and bilateral lower quadrant pain.  She has a hx of PUD and gastroparesis.  +vomiting and nausea yesterday, only nausea today.  Reglan 5mg  IV continues TID with PRN zofran. BLE cramps improved mildly after magnesium repletion.  Venous doppler US of BLE negative for DVT.  CK level pending.     Principal Problem:    Other chest pain  Active Problems:    Periumbilical abdominal pain    Uncontrolled type 2 diabetes mellitus with hyperglycemia    Fibromyalgia    History of CVA in adulthood    Diabetic gastroparesis  Resolved Problems:    * No resolved hospital problems. *      Active Hospital Problems    Diagnosis   . Other chest pain   . Periumbilical abdominal pain   . Uncontrolled type 2 diabetes mellitus with hyperglycemia   . Fibromyalgia   . History of CVA in adulthood   . Diabetic gastroparesis       Assessment/Plan:   1.  Chest  pain   IMG consulted, appreciate recommendations   ECHO OP   Stop diltiazem upon d/c   Increase lopressor to 25mg  PO BID on d/c   Continue amlodipine 5mg  PO daily    2.  Abdominal pain   CT a/p showed a 5.1 adnexal lesion, not simple cystic   Transvaginal US showed fibroid uterus   OP GYN follow up    GI to consult, awaiting recommendations   Continue NPO status with meds    3. Uncontrolled DMII with hyperglycemia   A1C 8.9   NPH 70/30 50 units TID with meals   accu check achs   SSI   Endocrinology consulted, appreciate recommendations--paged Dr. Doristine Mango at ~1320, awaiting call back    4. Hx ov CVA   noted    5. Nausea, Vomiting, hx of gastroparesis   Reglan 5mg  IV every 8 hours   Zofran 4mg  IV every 8 hours PRN    6.  Chronic pain d/t fibromyalgia   Oxycodone 5mg  PO every 6 hours PRN   Requests dilaudid IV, would refrain   Morphine 4mg  IV x 3 doses PRN, transition to oral once vomiting has ceased   OP Pain management initiation needed    7.  BLE pain, cramps   Improving some after IV magnesium   Venous Doppler US negative for DVT   Start Gabapentin 100mg  PO every 8 hours  Check CK level    8. AKI   Likely due to combination of HTN, and hyperglycemia   Continue IVF   BMP in AM   See #3 and #9    9.  HTN   Stop diltiazem   Metorprolol 25mg  PO BID   Resume amlodipine 5mg  daily   Start chlorthalidone 25mg  PO daily on discharge   Hydralazine 10mg  IV every 6 hours PRN for SBP >180    DVT PPX: lovenox, SCDs  Code: Full  Diet: NPO  Please see attending note that follows this mid-level encounter note.   Review of Systems:     Review of Systems   Constitutional: Positive for malaise/fatigue. Negative for chills, diaphoresis, fever and weight loss.   HENT: Negative.    Eyes: Negative.    Respiratory: Negative.    Cardiovascular: Positive for chest pain. Negative for palpitations, orthopnea, claudication, leg swelling and PND.   Gastrointestinal: Positive for abdominal pain, nausea and  vomiting. Negative for blood in stool, constipation, diarrhea, heartburn and melena.   Genitourinary: Negative.    Musculoskeletal: Negative.    Skin: Negative.    Neurological: Negative for dizziness, tingling, tremors, sensory change, speech change, focal weakness, seizures, loss of consciousness, weakness and headaches.   Psychiatric/Behavioral: Negative.      Physical Exam:   BP (!) 168/102   Pulse (!) 116   Temp 97.9 F (36.6 C) (Oral)   Resp 20   Ht 1.676 m (5\' 6" )   Wt 133.8 kg (295 lb)   SpO2 99%   BMI 47.61 kg/m   Body mass index is 47.61 kg/m.    Intake/Output Summary (Last 24 hours) at 02/03/17 1316  Last data filed at 02/02/17 1653   Gross per 24 hour   Intake             1000 ml   Output                0 ml   Net             1000 ml       Physical Exam   Constitutional: She is oriented to person, place, and time. She appears well-developed and well-nourished. She appears distressed.   HENT:   Head: Normocephalic and atraumatic.   Right Ear: External ear normal.   Left Ear: External ear normal.   Nose: Nose normal.   Mouth/Throat: Oropharynx is clear and moist. No oropharyngeal exudate.   Eyes: Pupils are equal, round, and reactive to light. Conjunctivae and EOM are normal. Right eye exhibits no discharge. Left eye exhibits no discharge. No scleral icterus.   Neck: Normal range of motion. Neck supple. No JVD present. No tracheal deviation present. No thyromegaly present.   Cardiovascular: Normal rate, regular rhythm, normal heart sounds and intact distal pulses.  Exam reveals no gallop and no friction rub.    No murmur heard.  Pulmonary/Chest: Effort normal and breath sounds normal. No stridor. No respiratory distress. She has no wheezes. She has no rales. She exhibits no tenderness.   Abdominal: Soft. Bowel sounds are normal. She exhibits no distension and no mass. There is tenderness (epigastric). There is no rebound and no guarding. No hernia.   Musculoskeletal: Normal range of motion. She  exhibits no edema, tenderness or deformity.   Lymphadenopathy:     She has no cervical adenopathy.   Neurological: She is alert and oriented to person, place, and time. She displays normal reflexes. No cranial nerve deficit  or sensory deficit. She exhibits normal muscle tone. Coordination normal.   Skin: Skin is warm and dry. Capillary refill takes 2 to 3 seconds. No rash noted. She is not diaphoretic. No erythema. No pallor.   Psychiatric: She has a normal mood and affect. Her behavior is normal. Judgment and thought content normal.   Nursing note and vitals reviewed.      Meds:     Current Facility-Administered Medications   Medication Dose Route Frequency   . dilTIAZem  240 mg Oral Daily   . DULoxetine  60 mg Oral Q12H SCH   . enoxaparin  40 mg Subcutaneous Daily   . gabapentin  100 mg Oral Q8H SCH   . influenza  0.5 mL Intramuscular Once   . insulin NPH-regular 70-30 MIXTURE  50 Units Subcutaneous TID AC   . metoclopramide  5 mg Intravenous TID MEALS   . pantoprazole  40 mg Oral QAM AC     Labs:     Recent Labs      02/02/17   1354   WBC  11.19*   Hgb  10.5*   Hematocrit  33.2*   Platelets  326   MCV  89.5     Recent Labs      02/03/17   0103  02/02/17   1354   Sodium  133*  136   Potassium  4.3  4.3   Chloride  104  106   CO2  18*  19*   BUN  22.0*  22.0*   Creatinine  1.4*  1.3*   Glucose  511*  241*   Calcium  9.3  10.0   Magnesium  1.8  1.4*     Recent Labs      02/02/17   1354   AST (SGOT)  17   ALT  14   Alkaline Phosphatase  105   Protein, Total  7.3   Albumin  3.3*     No results for input(s): PTT, PT, INR in the last 72 hours.  Imaging personally reviewed.    Safety Checklist  DVT prophylaxis:  CHEST guideline (See page [id number]) Chemical and Mechanical   Foley:   Rn Foley protocol Not present   IVs:  Peripheral IV   PT/OT: Not needed   Daily CBC & or Chem ordered:  SHM/ABIM guidelines (see #5) Yes, due to clinical and lab instability     Disposition:   Today's date: 02/03/2017  Admit Date:  02/02/2017 12:30 PM  Anticipated medical stability for discharge:Yellow - maybe tomorrow  Service status: Observation: Due to AKI, endocrinology consult, pain control, Gi consult, Pelvic US,   Reason for ongoing hospitalization: same as above  Anticipated discharge needs: TBD  I have discussed with Attending: Arizona Constable, MD  Signed by: Brock Bad, FNP-C  Adult Observation Unit Nurse Practitioner  [phone] or [phone] on NT6   I have Reviewed the interval history, images and pertinent test results and personally examined the patient and confirmed the major physical findings of the preceding mid-levels note. Agree with above

## 2017-02-04 ENCOUNTER — Other Ambulatory Visit: Payer: Self-pay

## 2017-02-04 LAB — GLUCOSE WHOLE BLOOD - POCT
Whole Blood Glucose POCT: 252 mg/dL — ABNORMAL HIGH (ref 70–100)
Whole Blood Glucose POCT: 144 mg/dL — ABNORMAL HIGH (ref 70–100)
Whole Blood Glucose POCT: 80 mg/dL (ref 70–100)

## 2017-02-04 MED ORDER — INSULIN NPH ISOPHANE & REGULAR (70-30) 100 UNIT/ML SC SUSP
40.00 [IU] | Freq: Two times a day (BID) | SUBCUTANEOUS | 0 refills | Status: DC
Start: 2017-02-04 — End: 2017-02-04
  Filled 2017-02-04: qty 30, 38d supply, fill #0

## 2017-02-04 MED ORDER — INSULIN NPH (HUMAN) (ISOPHANE) 100 UNIT/ML SC SUSP
20.00 [IU] | Freq: Two times a day (BID) | SUBCUTANEOUS | 3 refills | Status: DC
Start: 2017-02-04 — End: 2017-02-04

## 2017-02-04 MED ORDER — RELION CONFIRM GLUCOSE MONITOR W/DEVICE KIT
PACK | 0 refills | Status: AC
Start: 2017-02-04 — End: ?

## 2017-02-04 MED ORDER — LANCETS 30G MISC
0 refills | Status: AC
Start: 2017-02-04 — End: ?

## 2017-02-04 MED ORDER — INSULIN LISPRO 100 UNIT/ML SC SOLN
5.00 [IU] | Freq: Once | SUBCUTANEOUS | Status: AC
Start: 2017-02-04 — End: 2017-02-04
  Administered 2017-02-04: 09:00:00 5 [IU] via SUBCUTANEOUS
  Filled 2017-02-04: qty 3

## 2017-02-04 MED ORDER — LIDOCAINE VISCOUS 2 % MT SOLN
10.00 mL | Freq: Once | OROMUCOSAL | Status: AC
Start: 2017-02-04 — End: 2017-02-04
  Administered 2017-02-04: 10:00:00 10 mL via OROMUCOSAL
  Filled 2017-02-04: qty 15

## 2017-02-04 MED ORDER — METOPROLOL TARTRATE 25 MG PO TABS
25.00 mg | ORAL_TABLET | Freq: Two times a day (BID) | ORAL | 0 refills | Status: DC
Start: 2017-02-04 — End: 2017-03-04

## 2017-02-04 MED ORDER — PANTOPRAZOLE SODIUM 40 MG PO TBEC
40.00 mg | DELAYED_RELEASE_TABLET | Freq: Two times a day (BID) | ORAL | 0 refills | Status: DC
Start: 2017-02-04 — End: 2017-02-08

## 2017-02-04 MED ORDER — GABAPENTIN 300 MG PO CAPS
300.00 mg | ORAL_CAPSULE | Freq: Three times a day (TID) | ORAL | Status: DC
Start: 2017-02-04 — End: 2017-02-04
  Administered 2017-02-04: 14:00:00 300 mg via ORAL
  Filled 2017-02-04: qty 1

## 2017-02-04 MED ORDER — INSULIN LISPRO 100 UNIT/ML SC SOLN
14.00 [IU] | Freq: Three times a day (TID) | SUBCUTANEOUS | 3 refills | Status: DC
Start: 2017-02-04 — End: 2017-02-08

## 2017-02-04 MED ORDER — INSULIN NPH ISOPHANE & REGULAR (70-30) 100 UNIT/ML SC SUSP
40.00 [IU] | Freq: Two times a day (BID) | SUBCUTANEOUS | 0 refills | Status: DC
Start: 2017-02-04 — End: 2017-02-08

## 2017-02-04 MED ORDER — DICYCLOMINE HCL 10 MG PO CAPS
10.00 mg | ORAL_CAPSULE | Freq: Four times a day (QID) | ORAL | Status: DC
Start: 2017-02-04 — End: 2017-02-04
  Administered 2017-02-04 (×2): 10 mg via ORAL
  Filled 2017-02-04 (×4): qty 1

## 2017-02-04 MED ORDER — METOCLOPRAMIDE HCL 5 MG PO TABS
5.00 mg | ORAL_TABLET | Freq: Four times a day (QID) | ORAL | 0 refills | Status: DC
Start: 2017-02-04 — End: 2017-02-08

## 2017-02-04 MED ORDER — PANTOPRAZOLE SODIUM 40 MG PO TBEC
40.00 mg | DELAYED_RELEASE_TABLET | Freq: Two times a day (BID) | ORAL | Status: DC
Start: 2017-02-04 — End: 2017-02-04

## 2017-02-04 MED ORDER — INSULIN LISPRO 100 UNIT/ML SC SOLN
14.00 [IU] | Freq: Three times a day (TID) | SUBCUTANEOUS | Status: DC
Start: 2017-02-04 — End: 2017-02-04
  Administered 2017-02-04: 14:00:00 14 [IU] via SUBCUTANEOUS
  Filled 2017-02-04: qty 3

## 2017-02-04 MED ORDER — INSULIN NPH ISOPHANE & REGULAR (70-30) 100 UNIT/ML SC SUSP
40.00 [IU] | Freq: Two times a day (BID) | SUBCUTANEOUS | Status: DC
Start: 2017-02-04 — End: 2017-02-04
  Filled 2017-02-04: qty 3

## 2017-02-04 MED ORDER — INSULIN SYRINGE-NEEDLE U-100 27G X 1/2" 0.5 ML MISC
1.00 | Freq: Every day | 3 refills | Status: AC
Start: 2017-02-04 — End: ?

## 2017-02-04 MED ORDER — GABAPENTIN 300 MG PO CAPS
300.00 mg | ORAL_CAPSULE | Freq: Three times a day (TID) | ORAL | 0 refills | Status: DC
Start: 2017-02-04 — End: 2017-02-08

## 2017-02-04 MED ORDER — AMLODIPINE BESYLATE 5 MG PO TABS
5.00 mg | ORAL_TABLET | Freq: Every day | ORAL | 0 refills | Status: AC
Start: 2017-02-04 — End: ?

## 2017-02-04 MED ORDER — DICYCLOMINE HCL 10 MG PO CAPS
10.00 mg | ORAL_CAPSULE | Freq: Four times a day (QID) | ORAL | 0 refills | Status: DC
Start: 2017-02-04 — End: 2017-03-04

## 2017-02-04 MED ORDER — GLUCOSE BLOOD VI STRP
ORAL_STRIP | 0 refills | Status: AC
Start: 2017-02-04 — End: ?

## 2017-02-04 MED ORDER — ALUM & MAG HYDROXIDE-SIMETH 200-200-20 MG/5ML PO SUSP
30.00 mL | Freq: Once | ORAL | Status: AC
Start: 2017-02-04 — End: 2017-02-04
  Administered 2017-02-04: 10:00:00 30 mL via ORAL
  Filled 2017-02-04: qty 30

## 2017-02-04 NOTE — Plan of Care (Signed)
Problem: Safety  Goal: Patient will be free from injury during hospitalization  Outcome: Progressing   02/04/17 0800   Goal/Interventions addressed this shift   Patient will be free from injury during hospitalization  Assess patient's risk for falls and implement fall prevention plan of care per policy;Use appropriate transfer methods;Provide and maintain safe environment     Goal: Patient will be free from infection during hospitalization  Outcome: Progressing   02/04/17 0800   Goal/Interventions addressed this shift   Free from Infection during hospitalization Monitor lab/diagnostic results;Assess and monitor for signs and symptoms of infection       Problem: Pain  Goal: Pain at adequate level as identified by patient  Outcome: Progressing   02/04/17 0800   Goal/Interventions addressed this shift   Pain at adequate level as identified by patient Identify patient comfort function goal;Assess for risk of opioid induced respiratory depression, including snoring/sleep apnea. Alert healthcare team of risk factors identified.;Assess pain on admission, during daily assessment and/or before any "as needed" intervention(s)       Problem: Side Effects from Pain Analgesia  Goal: Patient will experience minimal side effects of analgesic therapy  Outcome: Progressing   02/04/17 0800   Goal/Interventions addressed this shift   Patient will experience minimal side effects of analgesic therapy Monitor/assess patient's respiratory status (RR depth, effort, breath sounds);Prevent/manage side effects per LIP orders (i.e. nausea, vomiting, pruritus, constipation, urinary retention, etc.);Assess for changes in cognitive function       Problem: Discharge Barriers  Goal: Patient will be discharged home or other facility with appropriate resources  Outcome: Progressing   02/04/17 0800   Goal/Interventions addressed this shift   Discharge to home or other facility with appropriate resources Provide appropriate patient education;Provide  information on available health resources       Problem: Psychosocial and Spiritual Needs  Goal: Demonstrates ability to cope with hospitalization/illness  Outcome: Progressing   02/04/17 0800   Goal/Interventions addressed this shift   Demonstrates ability to cope with hospitalizations/illness Encourage verbalization of feelings/concerns/expectations;Provide quiet environment;Assist patient to identify own strengths and abilities       Problem: Moderate/High Fall Risk Score >5  Goal: Patient will remain free of falls  Outcome: Progressing   02/03/17 2130   OTHER   Moderate Risk (6-13) MOD-Initiate Yellow "Fall Risk" magnet communication tool;MOD-Consider activation of bed alarm if appropriate;MOD-Apply bed exit alarm if patient is confused;MOD-Floor mat at bedside (where available) if appropriate;MOD-Use of chair-pad alarm when appropriate;MOD-Consider a move closer to Nurses Station;MOD-Remain with patient during toileting;MOD-Use of assistive devices-bedside commode if appropriate;MOD-Place bedside commode and assistive devices out of sight when not in use;MOD-Re-orient confused patients;MOD-Utilize diversion activities;MOD-Request PT/OT consult order for patients with gait/mobility impairment;MOD-Place Fall Risk level on whiteboard in room;MOD-include family in multidisciplinary POC discussions       Problem: Chest Pain  Goal: Vital signs and cardiac rhythm stable  Outcome: Progressing   02/04/17 0800   Goal/Interventions addressed this shift   Vital signs and cardiac rhythm stable Assess the need for oxygen therapy and administer as ordered;Monitor [name] [name] vital signs/cardiac rhythms     Goal: Cardiac pain management  Outcome: Progressing   02/04/17 0800   Goal/Interventions addressed this shift   Cardiac pain management  Assess/report chest pain/or related discomfort to LIP immediately;Instruct patient to report any change in pain status       Problem: Altered GI Function  Goal: Fluid and electrolyte balance  are achieved/maintained  Outcome: Progressing   02/04/17 0800  Goal/Interventions addressed this shift   Fluid and electrolyte balance are achieved/maintained Monitor intake and output every shift;Monitor/assess lab values and report abnormal values;Provide adequate hydration

## 2017-02-04 NOTE — Discharge Instr - AVS First Page (Addendum)
Reason for your Hospital Admission:  Abdominal Pain, Chest pain, and bilateral lower leg pain.      Instructions for after your discharge:  1.  Follow up with the Transitional care clinic on 02/15/17 at 8:15 AM.  They will help monitor and manage your health care, in particular your abdominal pain, leg pain, and diabetes. They will also help set you up with the endocrinologist to further assess and manage your diabetes.    2.  Follow up with cardiology.  Call to make the next available appointment within one week.   3.  Follow up with GI within one week.  They will help schedule for an upper endoscopy  4.  Check your blood sugars each morning and evening before meals and record your readings.  Bring the readings to your transitional Care clinic follow up.  5.  Follow a cardiac, low residue diet.  Eat 6 small meals daily instead of 3 large meals.  Stay well hydrated.  6.  Continue to take reglan three times a day to help manage gastroparesis  7.  Continue to take your insulin as directed.  8.  You can buy the Relion glucometer, test strips, and lancets at [facility] without a prescription.   9.  Follow up with a gynecologist regarding your uterine fibroids.   10.  Gabapentin 300mg  three times per day was ordered to assist with pain management for your abdomen and legs.  11.  Bentyl was ordered 4 times per day to assist with the abdominal cramping.   12.  Check your blood pressure each morning after you wake up, record the readings, and show your doctors at your follow up appointments.   Return to the nearest emergency department for chest pain, shortness of breath, fever >100.5 degrees, dizziness, syncope, profuse vomiting or diarrhea, or any other concerning symptoms.       Using a Blood Sugar Log    You have diabetes. This means your body has troubleregulating a sugar called glucose. To help manage your diabetes, you'll need to check your blood sugar level as directed by your healthcare provider. Keeping a log of  your blood sugar levels will help you track your blood sugar readings. It's a simple and easy way to see how well you are controlling your diabetes.  Checking your blood sugar level  You can check your blood sugar level with a blood glucose meter. You'll first prick the side of your finger with a tiny lancet to draw a tiny drop of blood onto the test strip. Some glucose meters let you use another place on your body to test. But these other places should not be used in some cases as they may be inaccurate. Follow the instructions for your glucose meter. And talk with your healthcare provider before doing the test on other places.  The strip goes into the meter first, then a drop of blood is placed on the tip of the strip. The meter then shows a reading that tells you the level of your blood sugar. Your readings should be in your target range as often as possible. This means not too high or too low. Staying in this range helps lower your risk for complications. Your healthcare provider will help you figure out the target range that is best for you.  Tracking your readings  Every time you check your blood sugar, use your log to keep track of your readings. Your meter will also probably have a memory feature that your healthcare  provider can check at your next visit. You may be advised by your healthcare provider to check your blood sugar in the morning, at bedtime, and before and after meals. Be sure to write down all of your numbers. Also use your log to record things that might have affected your blood sugar. Some examples include being sick, certain medicines, being physically active, feeling stressed, or skipping meals.  Lessons learned from your readings  Tracking your blood sugar readings helps you see patterns. These patterns tell you how your actions affect your blood sugar. For instance, you may have higher numbers after eating certain foods or lower numbers after exercise. They just help you understand how  to stay in your target range more often, so that your diabetes remains in good control.  Sharing your log with your healthcare team  Bring your blood sugar log and glucose meter with you to all of your healthcare appointments. This can help your healthcare team make changes to your treatment plan, if needed. This may involve making changes in what you eat, what medicines you take, or how much you exercise.  To learn more  The resources below can help you learn more:   American Diabetes Association800-342-2383www.diabetes.org   [employer]800-829-0500www.lighthouse.org   National [employer] [phone] [place]   Hormone Health [phone] www.hormone.org  Date Last Reviewed: 07/07/2014   2000-2018 [employer], [place]. [address] [place], [place] [address]. All rights reserved. This information is not intended as a substitute for professional medical care. Always follow your healthcare professional's instructions.      Exercise to Manage Your Blood Sugar    Being physically active every day can help you manage your blood sugar. That's because an active lifestyle can improve your body's ability to use insulin. Daily activity can also help delay or prevent complications of diabetes. And it's a great way to relieve stress. If you aren't normally active, be sure to consult your healthcare provider before getting started.  How much activity do you need?  If daily activity is new to you, start slow and steady. Begin with24minutes of activity each day. Then work up to at least 150 minutes a week of physical activity. Don't let more than 2 days go by without being active. When sitting for long periods of time, get up for short sessions of light activity every 30 minutes  Just move!  You don't have to join a gym or own pricey sports equipment. Just get out and walk. Walking is an aerobic exercise that makes your heart and lungs work hard. It helps your heart and blood  vessels. Walking needs only a sturdy pair of sneakers and your own two feet. The more you walk, the easier it gets:   Schedule time every day to move your feet.   Make it part of your daily routine.   Walk with a friend or a group to keep it interesting and fun.   Try taking several short walks during the day to meet your daily activity goal.  A pedometer makes every step count  A pedometer is a small device that keeps track of how many steps you take. You can clip it to your belt (or a strap on your arm or leg) and go about your daily routine. "Smartphones" now also have apps to record your walking.At the end of the day, the pedometer shows the total number of steps you took. Use a pedometer to set daily goals for yourself. For instance, if you walk4,000  steps a day, try adding 200 more steps each day. Aim for a goal of7,500. With every step, you're doing a little more to help your body use insulin.   Adding resistance exercise  Resistance exercise (also called strength training), makes muscles stronger. It also helps muscles use insulin better. Ask your healthcare provider whether this type of exercise is right for you. If it is, your healthcare provider can help you work it in to your activity plan.  Staying safe  Being active may cause blood sugar to drop faster than usual. This is especially true if you take medicine to manage your blood sugar. But there are things you can do to help reduce the risk of accidental lows. Keep these tips in mind:   Always carry identification when you exercise outside your home. Carry a cell phone to use in case of emergency.   If you can, include friends and family in your activities.   Wear a medical ID bracelet that says you have diabetes.   Use the right safety equipment for the activity you do (such as a bicycle helmet when you ride a bicycle outdoors). Wear closed-toed shoes that fit your feet well.   Drink plenty of water before and during activity.   Keep a  fast-acting sugar (such as glucose tablets) on hand in case of low blood sugar.   Dress properly for the weather. Wear a hat if it's sunny, or wait until evening if it's too hot.   Avoid being active for long periods in very hot or very cold weather.   Skip activity if you're sick.    Notice how activity affects blood sugar  Physical activity is important when you have diabetes. But you need to keep an eye on your blood sugar level. Check often if you have been active for longer than usual, or if the activity was unplanned. Make it a habit to check your blood sugar before being active. And check again a few hours later. Use your log book to write down how activity affects your numbers. If you take insulin, you may be able to adjust your dose before a planned activity. This can help prevent lows. You may also need to take a small carbohydrate snack before the exercise. Talk to your healthcare provider to learn more.   Date Last Reviewed: 08/07/2014   2000-2018 [employer], [place]. [address], [place], [place] [address]. All rights reserved. This information is not intended as a substitute for professional medical care. Always follow your healthcare professional's instructions.      Managing Diabetes: The A1C Test--your A1C was 8.9        Healthy red blood cells have some glucose stuck to them. A high A1C means that unhealthy amounts of glucose are stuck to the cells.   What is the A1C test?  Using your meter helps you track your blood sugar every day. But your glucose meter tells you the value at the time of testing only. You also need to know if your treatment plan is keeping you healthy over time.The hemoglobinA1C (or glycated hemoglobin) test can help. This test measures your average blood sugar level over a few months. A higher A1C result means that you have a higher risk of developing complications.  The A1C test  The A1C is a blood test done by your healthcare provider. You will likely have an  A1C test every3 to 6months.  Your blood glucose goal  A1C has been shown as  a percentage. But it can also be shown as a number representing the estimated Average Glucose (eAG). Unlike the A1C percentage, eAG is a number similar to the numbers listed on your daily glucose monitor. Both A1C and eAG measure the amount of glucose stuck to a protein called hemoglobin in red blood cells. Your healthcare provider will help you figure out what your ideal A1C or eAG should be. Your target number will depend on your age, general health, and other factors. If your current number is too high, your treatment plan may need changes, such as different medicines.  Sample results  Most people aim for an A1c lower than 7%. That's an eAG less than 154 mg/dL. Or, your healthcare provider may want you to aim for an A1C of 6%. That's an eAG of 126 mg/dL.    Glucose calculator  [email or web address].org/[email or web address]_calc for a chart that helps convert your A1C percentages into eAG numbers.   Date Last Reviewed: 08/07/2014   2000-2018 [employer], [place]. [address] [place], [place] [address]. All rights reserved. This information is not intended as a substitute for professional medical care. Always follow your healthcare professional's instructions.        Low-Fiber Diet     Eggs are high in protein and easy to digest.   Eating a low-fiber diet means eating foods that don't have much fiber. These foods are easy to digest.  Most of the fiber that you eat passes undigested through your bowel. This is what forms stool. Low-fiber foods can help to slow down your bowel movements. When you eat a low-fiber diet, you have fewer stools. This lets your intestine rest.  Your healthcare provider will tell you how long you need to be on this diet. It may only be for a short time. Low-fiber foods often don't give you all the nutrients you need tostayhealthy. Your healthcare provider may have you take certain vitamins while  you are on this diet.  Reasons to eat a low-fiber diet  The goal of alow-fiber diet is tolimit the size and number of your stools. It may be prescribed if you:   Are going through chemotherapy or radiation treatments   Have had intestinal surgery   Have a condition that affects your intestine, such as irritable bowel syndrome, Crohn's disease, ulcerative colitis, or diverticulitis  General guidelines for a low-fiber diet  In general, a low-fiber diet means having fewer than 13 grams of fiber a day. Your healthcare provider may give you a list of things you can and can't eat or drink. Read food labels. Choose foods and drinks that have as close to zero grams of fiber as possible. Here are general guidelines to follow:  Breads, pasta, cereal, rice, and other starches (6 to 11 servings daily)   What to choose: white bread, biscuits, muffins, and white rolls; plain crackers; waffles; white pasta; white rice; cream of wheat; grits; white pancakes; corn flakes; cooked potatoes without skin.Fiber content of these foods should be less than 0.5 (1/2) gram per serving.   What to avoid: whole-wheat or whole-grain breads, crackers, and pasta; breads with seeds or nuts; wheat germ; graham crackers;cornbread; wild or brown rice; cereals with whole-grain, bran, and granola; cereals with seeds, nuts, coconut, or dried fruit; potatoes with skin  Milk and dairy (2 servings daily)   What to choose: milk, buttermilk; yogurt or ice cream without seeds or nuts; custard or pudding; sour cream; cheese and cottage cheese   What to avoid: ice  cream and yogurt with seeds, nuts, or fruit chunks  Fruit (2 to 4 servings daily)   What to choose: ripe banana; ripe nectarine, peach, apricot, papaya, and plum; soft honeydew melon and cantaloupe; cooked or canned fruit without skin or seeds (not sweetened with sorbitol); applesauce; strained fruit juice (without pulp)   What to avoid: raw or dried fruit; all berries; raisins; canned and  raw pineapple; prunes and prune juice; fruit juice with pulp  Vegetables (3 to 5 servings daily)   What to choose: well-cooked or canned vegetables without seeds, such as spinach, eggplant, green and wax beans, carrots, yellow squash, pumpkin; lettuce on a sandwich   What to avoid: all raw or steamed vegetables; vegetables with seeds, such as unstrained tomato sauce; green peas; lima beans; broccoli; corn; parsnips  Meats and protein (4 to 6 ounces daily)   What to choose: tender, well-cooked meat, including ground meat, poultry, and fish; eggs; tofu; creamy peanut butter   What to avoid: tough, chewy meat with gristle; peas, including split, yellow, and black-eyed; beans, including navy, lima, black, garbanzo, soy, pinto, andlentil; peanuts and crunchy peanut butter  Fats, oils, sauces, condiments (fewer than 8 teaspoons daily)   What to choose: butter, margarine, oils, whipped cream, sour cream, mayonnaise, smooth dressings and sauces; plain gravy; smooth condiments   What to avoid: dressing with seeds or fruit chunks; pickles and relishes  Other foods and drinks   What to choose: water; plain gelatin; plain puddings; pretzels; plain cookies and cakes; honey, syrup; decaffeinated drinks, including tea and coffee   What to avoid: popcorn; potato chips;spicy foods; fried, greasy foods; alcohol (ask your healthcare provider); marmalade, jam, and preserves; desserts that have seeds, nuts, coconut, dried fruit, whole grains, or bran; candy that has seeds or nuts; drinks sweetened with sorbitol or other sugar substitutes; caffeinated drinks, including tea, coffee, soda, and energy drinks  Date Last Reviewed: 08/07/2015   2000-2018 [employer], [place]. [address] [place], [place] [address]. All rights reserved. This information is not intended as a substitute for professional medical care. Always follow your healthcare professional's instructions.      Step-by-Step  Checking Your Blood Sugar    Date  Last Reviewed: 07/07/2014   2000-2018 [employer], [place]. [address], [place], [place] [address]. All rights reserved. This information is not intended as a substitute for professional medical care. Always follow your healthcare professional's instructions.      Gabapentin capsules or tablets  Brand Names: Active-PAC with Gabapentin, Neurontin  What is this medicine?  GABAPENTIN (GA ba pen tin) is used to control partial seizures in adults with epilepsy. It is also used to treat certain types of nerve pain.  How should I use this medicine?  Take this medicine by mouth with a glass of water. Follow the directions on the prescription label. You can take it with or without food. If it upsets your stomach, take it with food.Take your medicine at regular intervals. Do not take it more often than directed. Do not stop taking except on your doctor's advice.  If you are directed to break the 600 or 800 mg tablets in half as part of your dose, the extra half tablet should be used for the next dose. If you have not used the extra half tablet within 28 days, it should be thrown away.  A special MedGuide will be given to you by the pharmacist with each prescription and refill. Be sure to read this  information carefully each time.  Talk to your pediatrician regarding the use of this medicine in children. Special care may be needed.  What side effects may I notice from receiving this medicine?  Side effects that you should report to your doctor or health care professional as soon as possible:   allergic reactions like skin rash, itching or hives, swelling of the face, lips, or tongue   worsening of mood, thoughts or actions of suicide or dying  Side effects that usually do not require medical attention (report to your doctor or health care professional if they continue or are bothersome):   constipation   difficulty walking or controlling muscle movements   dizziness   nausea   slurred speech   tiredness    tremors   weight gain  What may interact with this medicine?  Do not take this medicine with any of the following medications:   other gabapentin products  This medicine may also interact with the following medications:   alcohol   antacids   antihistamines for allergy, cough and cold   certain medicines for anxiety or sleep   certain medicines for depression or psychotic disturbances   homatropine; hydrocodone   naproxen   narcotic medicines (opiates) for pain   phenothiazines like chlorpromazine, mesoridazine, prochlorperazine, thioridazine  What if I miss a dose?  If you miss a dose, take it as soon as you can. If it is almost time for your next dose, take only that dose. Do not take double or extra doses.  Where should I keep my medicine?  Keep out of reach of children.  This medicine may cause accidental overdose and death if it taken by other adults, children, or pets. Mix any unused medicine with a substance like cat litter or coffee grounds. Then throw the medicine away in a sealed container like a sealed bag or a coffee can with a lid. Do not use the medicine after the expiration date.  Store at room temperature between 15 and 30 degrees C (59 and 86 degrees F).  What should I tell my health care provider before I take this medicine?  They need to know if you have any of these conditions:   kidney disease   suicidal thoughts, plans, or attempt; a previous suicide attempt by you or a family member   an unusual or allergic reaction to gabapentin, other medicines, foods, dyes, or preservatives   pregnant or trying to get pregnant   breast-feeding  What should I watch for while using this medicine?  Visit your doctor or health care professional for regular checks on your progress. You may want to keep a record at home of how you feel your condition is responding to treatment. You may want to share this information with your doctor or health care professional at each visit. You should contact  your doctor or health care professional if your seizures get worse or if you have any new types of seizures. Do not stop taking this medicine or any of your seizure medicines unless instructed by your doctor or health care professional. Stopping your medicine suddenly can increase your seizures or their severity.  Wear a medical identification bracelet or chain if you are taking this medicine for seizures, and carry a card that lists all your medications.  You may get drowsy, dizzy, or have blurred vision. Do not drive, use machinery, or do anything that needs mental alertness until you know how this medicine affects you. To reduce dizzy  or fainting spells, do not sit or stand up quickly, especially if you are an older patient. Alcohol can increase drowsiness and dizziness. Avoid alcoholic drinks.  Your mouth may get dry. Chewing sugarless gum or sucking hard candy, and drinking plenty of water will help.  The use of this medicine may increase the chance of suicidal thoughts or actions. Pay special attention to how you are responding while on this medicine. Any worsening of mood, or thoughts of suicide or dying should be reported to your health care professional right away.  Women who become pregnant while using this medicine may enroll in the [place] American Antiepileptic Drug Pregnancy Registry by calling [phone]. This registry collects information about the safety of antiepileptic drug use during pregnancy.  NOTE:This sheet is a summary. It may not cover all possible information. If you have questions about this medicine, talk to your doctor, pharmacist, or health care provider. Copyright 2018 [employer]      Taking Your Blood Pressure  Blood pressure is the force of blood against the artery wall as it moves from the heart through the blood vessels. You can take your own blood pressure reading using a digital monitor. Take your readings the same each time, using the same arm. Take readings as often as your  healthcare provider instructs.  About blood pressure monitors  Blood pressure monitors are designed for certain ages and cases. You can find monitors for older adults, for pregnant women, and for children. Make sure the one you choose is the right one for your age and situation.  The American Heart Association recommends an automatic cuff monitor that fits on your upper arm (bicep). The cuff should fit your arm size. A cuff that's too large or too small will not give an accurate reading. Measure around your upper arm to find your size.  Monitors that attach to your finger or wrist are not as accurate as monitors for your upper arm.  Ask your healthcare provider for help in choosing a monitor. Bring your monitor to your next provider visit if you need help in using it the correct way.  The steps below are general instructions for using an automatic digital monitor.  Step 1. Relax     Take your blood pressure at the same time every day, such as in the morning or evening, or at the time your healthcare provider recommends.   Wait at least a half-hour after smoking, eating, or exercising. Don't drink coffee, tea, soda, or other caffeinated beverages before checking your blood pressure.   Sit comfortably at a table with both feet on the floor. Do not cross your legs or feet. Place the monitor near you.   Rest for a few minutes before you begin.  Step 2. Wrap the cuff     Place your arm on the table, palm up. Your arm should be at the level of your heart. Wrap the cuff around your upper arm, just above your elbow. It's best done on bare skin, not over clothing. Most cuffs will indicate where the brachial artery (the blood vessel in the middle of the arm at the inner side of the elbow) should line up with the cuff. Look in your monitor's instruction booklet for an illustration. You can also bring your cuff to your healthcare provider and have them show you how to correctly place the cuff.  Step 3. Inflate the cuff      Push the button that starts the pump.   The cuff  will tighten, then loosen.   The numbers will change. When they stop changing, your blood pressure reading will appear.   Take 2 or 3 readings one minute apart.  Step 4. Write down the results of each reading     Write down your blood pressure numbers for each reading. Note the date and time. Keep your results in one place, such as a notebook. Even if your monitor has a built-in memory, keep a hard copy of the readings.   Remove the cuff from your arm. Turn off the machine.   Bring your blood pressure records with your healthcare providers at each visit.   If you start a new blood pressure medicine, note the day you started the new medicine. Also note the day if you change the dose of your medicine. This information goes on your blood pressure recording sheet. This will help your healthcare provider monitor how well the medicine changes are working.   Ask your healthcare provider what numbers should prompt you to call him or her. Also ask what numbers should prompt you to get help right away.  Date Last Reviewed: 01/07/2015   2000-2018 [employer], [place]. [address] [place], [place] [address]. All rights reserved. This information is not intended as a substitute for professional medical care. Always follow your healthcare professional's instructions.        Chlorthalidone tablets  Brand Name: Thomasene Mohair  What is this medicine?  CHLORTHALIDONE (klor THAL i done) is a diuretic. It increases the amount of urine passed, which causes the body to lose salt and water. This medicine is used to treat high blood pressure and edema or water retention.  How should I use this medicine?  Take this medicine by mouth with a glass of water. Follow the directions on the prescription label. It is best to take your dose in the morning with food. Take your medicine at regular intervals. Do not take your medicine more often than directed. Do not stop taking except on  your doctor's advice.  Talk to your pediatrician regarding the use of this medicine in children. Special care may be needed.  What side effects may I notice from receiving this medicine?  Side effects that you should report to your doctor or health care professional as soon as possible:   allergic reactions like skin rash, itching or hives, swelling of the face, lips, or tongue   dark urine   dry mouth   excess thirst   fast, irregular heart rate   fever, chills   muscle pain, cramps, or spasm   nausea, vomiting   redness, blistering, peeling or loosening of the skin, including inside the mouth   tingling, pain or numbness in the hands or feet   unusually weak or tired   yellowing of the eyes or skin  Side effects that usually do not require medical attention (report to your doctor or health care professional if they continue or are bothersome):   diarrhea or constipation   headache   impotence   loss of appetite   stomach upset  What may interact with this medicine?   barbiturate medicines for sleep or seizure control   digoxin   lithium   medicines for diabetes   norepinephrine   other medicines for high blood pressure   some pain medicines   steroid hormones like prednisone, cortisone, hydrocortisone, corticotropin   tubocurarine  What if I miss a dose?  If you miss a dose, take it as  soon as you can. If it is almost time for your next dose, take only that dose. Do not take double or extra doses.  Where should I keep my medicine?  Keep out of the reach of children.  Store at room temperature between 15 and 30 degrees C (59 and 86 degrees F). Keep container tightly closed. Throw away any unused medicine after the expiration date.  What should I tell my health care provider before I take this medicine?  They need to know if you have any of these conditions:   asthma   diabetes   gout   kidney disease   liver disease   parathyroid disease   systemic lupus erythematosus (SLE)    taking cortisone, digoxin, lithium carbonate, or drugs for diabetes   an unusual or allergic reaction to chlorthalidone, sulfa drugs, other medicines, foods, dyes, or preservatives   pregnant or trying to get pregnant   breast-feeding  What should I watch for while using this medicine?  Visit your doctor or health care professional for regular check ups. Check your blood pressure as directed. Ask your doctor or health care professional what your blood pressure should be and when you should contact him or her.  You may need to be on a special diet while taking this medicine. Ask your doctor.  You may get drowsy or dizzy. Do not drive, use machinery, or do anything that needs mental alertness until you know how this medicine affects you. Do not stand or sit up quickly, especially if you are an older patient. This reduces the risk of dizzy or fainting spells. Alcohol may interfere with the effect of this medicine. Avoid alcoholic drinks.  This medicine may affect your blood sugar level. If you have diabetes, check with your doctor or health care professional before changing the dose of your diabetic medicine.  This medicine can make you more sensitive to the sun. Keep out of the sun. If you cannot avoid being in the sun, wear protective clothing and use sunscreen. Do not use sun lamps or tanning beds/booths.  NOTE:This sheet is a summary. It may not cover all possible information. If you have questions about this medicine, talk to your doctor, pharmacist, or health care provider. Copyright 2018 [employer]        ===================================================================     ENDOCRINOLOGY HOSPITAL DISCHARGE RECOMMENDATIONS    Insulin Instructions Handout     Check your blood sugar Your target BG numbers     [x]      Fasting/before breakfast   80 - 130     [x]     Before meals   100 - 150     []     Bedtime          insulin Rx YOUR doctor has prescribed for you:        Time of Day Doses of insulin to take  (measured in units)      Pre-mixed   insulin as    [x]  70/30  []  75/25                                 Breakfast       40 units    Lunch        Dinner   40 units   Bedtime          SHORT ACTING INSULIN:     Watch for symptoms of low sugar (shakiness, weakness,  confusion, lightheadedness, or sweats) If it is low (<70), have a small snack such as 4 oz of orange juice, 1 cup of milk, 4-5 pieces of hard candy or 3-4 glucose tablets. Recheck sugar in 15 minutes. If not improved, can repeat as needed.    *Contact your Physician if blood glucose stays above 350.     Instructions for taking NPH insulin  . Start taking  70/30 NPH-REG insulin  ? _40_units with breakfast  ? _40_units with dinner    . Your fasting blood glucose is the first blood glucose measurement before breakfast.    . If your blood sugar level is above the target number for a time of day for 2 days in a row, make the following changes:    Fasting Blood Glucose greater than 130 Increase pre-dinner insulin 2 units   Before dinner greater than 180 mg/dL Increase  Pre-breakfast insulin dose by 2  units     . Continue to increase the NPH insulin every 2 days until the blood sugar at that time of day comes into the target range (see above).    . If your fasting blood glucose is low, without any explanation, e.g. you ate less or exercised more, than adjust your insulin as follows:    Fasting Blood Glucose less than 80 Decrease pre-dinner insulin dose by  4 units   Before dinner less than 100 Decrease Pre-breakfast insulin dose by 4 units     [place] [facility] Team    ==================================================================

## 2017-02-04 NOTE — Plan of Care (Signed)
Endocrinology made some insulin adjustments prior to discharge.  The patient has been prescribed NPH-regular 70/30 40 units BID instead of NPH 70/30.  She has also been provided prescriptions for a glucometer, strips, and lancets.     Sherol Dade, FNP-C

## 2017-02-04 NOTE — Progress Notes (Signed)
ENDOCRINE PROGRESS NOTE    Date Time: 02/04/17 8:37 AM  Patient Name: [name] [name],[name] [name]  Attending Physician: Roe Coombs  Consulting Physician: Clyde Canterbury, MD  Admit Date:  02/02/2017  Payor: /   Orders Placed This Encounter   Procedures   . Diet consistent carbohydrate      CC: DM    Assessment/Plan:   [name] [name] is a 51 y.o. AAW with uncontrolled T2DM (HgA1c of 8.9%), HTN, CVA in 2012, PUD, OSA, bulging lumbar disc, and obesity.  DM complicated by gastroparesis and neuropathy.  two days ago she received 125 mg of methylprednisolone.  Home regimen:  Novolog 70/30 50/60/70 (often missing the breakfast dose).        Recommend hospital discharge on 70/30 NPH-Reg 40 u BID   IGF1 pending.    Recommend she purchase her supplies and insulin from [facility] in order to save money.    Hospital follow up 02/25/17 in [facility] DM clinic    Clyde Canterbury, MD  [facility] Endocrinology  Spectralink: [phone] or Tiger Text or pager ID: [phone]   Hours Mon. - Friday 8- 5:00pm  On nights, holidays and weekends please page the endocrine consult pager [phone]) for emergencies.     Subjective:    [name] [name] feels better today without complaints at this time.       Review of Systems:   Review of Systems - Negative except per HPI      Physical Exam:   Temp:  [97.3 F (36.3 C)-98.1 F (36.7 C)] 97.3 F (36.3 C)  Heart Rate:  [79-116] 84  Resp Rate:  [15-20] 19  BP: (102-168)/(56-102) 127/71  Wt Readings from Last 1 Encounters:   02/02/17 133.8 kg (295 lb)      Body mass index is 47.61 kg/m.   No intake or output data in the 24 hours ending 02/04/17 0837    General: awake, alert, oriented x 3; no acute distress.  Lungs: no increase work of breathing.   Extremities: no clubbing, cyanosis, or edema     Meds:     Scheduled Meds:  Current Facility-Administered Medications   Medication Dose Route Frequency   . amLODIPine  5 mg Oral Daily   . DULoxetine  60 mg Oral Q12H SCH   . enoxaparin  40 mg  Subcutaneous Daily   . gabapentin  100 mg Oral Q8H SCH   . insulin lispro  8 Units Subcutaneous TID AC   . insulin NPH  20 Units Subcutaneous BID AC   . metoclopramide  5 mg Intravenous TID MEALS   . metoprolol tartrate  25 mg Oral Q12H SCH   . pantoprazole  40 mg Oral QAM AC     Continuous Infusions:  PRN Meds:.Nursing communication: Adult Hypoglycemia Treatment Algorithm **AND** dextrose **AND** dextrose **AND** glucagon (rDNA), hydrALAZINE, insulin lispro, insulin lispro, ondansetron, oxyCODONE    Labs:     Lab Results   Component Value Date    HGBA1C 8.9 (H) 02/03/2017       POCT - Glucose Whole blood   Date/Time Value Ref Range Status   02/04/2017 0729 252 (H) 70 - 100 mg/dL Final   29/56/2130 8657 279 (H) 70 - 100 mg/dL Final     Comment:     RN Notified   02/03/2017 1632 248 (H) 70 - 100 mg/dL Final   84/69/6295 2841 286 (H) 70 - 100 mg/dL Final   32/44/0102 7253 345 (H) 70 - 100 mg/dL Final  Recent Labs      02/02/17   1354   WBC  11.19*   Hgb  10.5*   Hematocrit  33.2*   Platelets  326   MCV  89.5       Recent Labs      02/03/17   1431  02/03/17   0103  02/02/17   1354   Sodium  136  133*  136   Potassium  3.7  4.3  4.3   Chloride  103  104  106   CO2  24  18*  19*   BUN  18.0  22.0*  22.0*   Creatinine  1.1*  1.4*  1.3*   Glucose  336*  511*  241*   Calcium  9.4  9.3  10.0   Magnesium   --   1.8  1.4*       Recent Labs      02/02/17   1354   AST (SGOT)  17   ALT  14   Alkaline Phosphatase  105   Protein, Total  7.3   Albumin  3.3*       No results for input(s): PTT, PT, INR in the last 72 hours.      Signed by: Clyde Canterbury, MD

## 2017-02-04 NOTE — Progress Notes (Signed)
Pt resting in bed at this time, complaining of abdominal cramping pain at this time. Pt's prn pain med brought to pt who stated that the oral oxycodone wasn't working for her and that they had started her on iv morphine for that reason. This rn informed pt that the physicians had changed the pain medications but that this rn would talk to medicine team to make a plan for pain management. Will continue to monitor.

## 2017-02-04 NOTE — Progress Notes (Signed)
02/04/17 1616   CM Review   CM Comments 11/30 [place] home w/self care. family to transprt. [place] checklist completed   Grace Blight MSW  [facility] Discharge Planner  [facility]  [phone]  [email or web address]

## 2017-02-04 NOTE — Discharge Summary -  Nursing (Signed)
Discharge instructions reviewed and all questions answered, patient verbalizes understanding, prescriptions and copy of instructions given to patient. Pt. Verbalizes understanding of follow-up MD appointments per discharge instructions. IV D/C'd. Patient discharged to home, family friend to be picking her up from lobby, taken down by wheelchair.

## 2017-02-04 NOTE — Discharge Summary (Signed)
FINAL PROGRESS NOTE/DISCHARGE SUMMARY      Date Time: 02/04/17 4:00 PM  Patient Name: [name] [name], [name] [name]  DOB: 07/10/1965  MRN: [id number]  Attending Physician: Roe Coombs  Primary Care Physician: Christa See, MD  Date of Admission:   02/02/2017  Date of Discharge:    02/04/17   Discharge Dx:   Principal Diagnosis (Diagnosis after study, that is chiefly responsible for admission to observation status): Other chest pain    Active Hospital Problems    Diagnosis POA   . Principal Problem: Other chest pain Yes   . Periumbilical abdominal pain Yes   . Uncontrolled type 2 diabetes mellitus with hyperglycemia Yes   . Fibromyalgia Yes   . History of CVA in adulthood Not Applicable   . Diabetic gastroparesis Yes      Resolved Hospital Problems    Diagnosis POA   No resolved problems to display.     Disposition:  home     Discharge MEDICATIONS        Medication List      START taking these medications    chlorthalidone 25 MG tablet  Commonly known as:  HYGROTEN  Take 1 tablet (25 mg total) by mouth daily.     dicyclomine 10 MG capsule  Commonly known as:  BENTYL  Take 1 capsule (10 mg total) by mouth 4 (four) times daily.     gabapentin 300 MG capsule  Commonly known as:  NEURONTIN  Take 1 capsule (300 mg total) by mouth every 8 (eight) hours.     insulin lispro 100 UNIT/ML injection  Commonly known as:  HumaLOG  Inject 14 Units into the skin 3 (three) times daily before meals.     insulin NPH 100 UNIT/ML injection  Commonly known as:  HumuLIN,NovoLIN  Inject 20 Units into the skin 2 (two) times daily before meals.     Insulin Syringe-Needle U-100 27G X 1/2" 0.5 ML Misc  1 each by Does not apply route 5 (five) times daily.     * pantoprazole 40 MG tablet  Commonly known as:  PROTONIX  Take 1 tablet (40 mg total) by mouth every morning before breakfast.     * pantoprazole 40 MG tablet  Commonly known as:  PROTONIX  Take 1 tablet (40 mg total) by mouth 2 (two) times daily.        * This list has 2  medication(s) that are the same as other medications prescribed for you. Read the directions carefully, and ask your doctor or other care provider to review them with you.            CHANGE how you take these medications    amLODIPine 5 MG tablet  Commonly known as:  NORVASC  Take 1 tablet (5 mg total) by mouth daily.  What changed:   medication strength   how much to take     metoclopramide 5 MG tablet  Commonly known as:  REGLAN  Take 1 tablet (5 mg total) by mouth 4 (four) times daily.  What changed:   medication strength   how much to take   when to take this     metoprolol tartrate 25 MG tablet  Commonly known as:  LOPRESSOR  Take 1 tablet (25 mg total) by mouth every 12 (twelve) hours.  What changed:  when to take this        CONTINUE taking these medications    ALLOPURINOL PO     ATORVASTATIN CALCIUM PO  CYMBALTA 60 MG capsule  Generic drug:  DULoxetine        STOP taking these medications    NOVOLOG MIX 70/30 SC     RANITIDINE HCL PO           Where to Get Your Medications      You can get these medications from any pharmacy    Bring a paper prescription for each of these medications   amLODIPine 5 MG tablet   chlorthalidone 25 MG tablet   dicyclomine 10 MG capsule   gabapentin 300 MG capsule   insulin lispro 100 UNIT/ML injection   insulin NPH 100 UNIT/ML injection   Insulin Syringe-Needle U-100 27G X 1/2" 0.5 ML Misc   metoclopramide 5 MG tablet   metoprolol tartrate 25 MG tablet   pantoprazole 40 MG tablet   pantoprazole 40 MG tablet       Pending Results, Recommendations & Instructions to providers after discharge:   1. Micro / Labs / Path pending:   Unresulted Labs     Procedure . . . Date/Time    Insulin-like growth factor [[id number]] Collected:  02/03/17 1427    Specimen:  Blood Updated:  02/03/17 1650        1.  Follow up with the Transitional care clinic on 02/15/17 at 8:15 AM.  They will help monitor and manage your health care, in particular your abdominal pain, leg pain, and  diabetes. They will also help set you up with the endocrinologist to further assess and manage your diabetes.    2.  Follow up with cardiology.  Call to make the next available appointment within one week.   3.  Follow up with GI within one week.  They will help schedule for an upper endoscopy  4.  Check your blood sugars three times daily before meals and record your readings.  Bring the readings to your transitional Care clinic follow up.  5.  Follow a cardiac, low residue diet.  Eat 6 small meals daily instead of 3 large meals.  Stay well hydrated.  6.  Continue to take reglan three times a day to help manage gastroparesis  7.  Continue to take your insulin as directed.  8.  You can buy the Relion glucometer, test strips, and lancets at [facility] without a prescription.   9.  Follow up with a gynecologist regarding your uterine fibroids.   10.  Gabapentin 300mg  three times per day was ordered to assist with pain management for your abdomen and legs.  11.  Bentyl was ordered 4 times per day to assist with the abdominal cramping.   12. Blood sugar and BP logs  Consultations:   Treatment Team: Attending Provider: Roe Coombs, MD; Consulting Physician: Lestine Mount, MD; Consulting Physician: Cheri Kearns, MD; Case Manager: Grace Blight; Consulting Physician: Concha Norway, MD; Registered Nurse: Frederik Pear, RN; Registered Nurse: Selena Lesser, RN   Recent Labs:     Recent Labs  Lab 02/02/17  1354   WBC 11.19*   Hgb 10.5*   Hematocrit 33.2*   Platelets 326       Recent Labs  Lab 02/03/17  1431   Sodium 136   Potassium 3.7   Chloride 103   CO2 24   BUN 18.0   Creatinine 1.1*   EGFR >60.0   Glucose 336*   Calcium 9.4             Invalid input(s): FREET4   No results found  for: T4.   Recent Labs  Lab 02/03/17  1431 02/03/17  0103 02/02/17  2115   Creatine Kinase (CK) 165  --   --    Troponin I  --  0.01 0.01         Recent Labs  Lab 02/03/17  1431   Cholesterol 128   Triglycerides 121   HDL  40   LDL Calculated 64             Microbiology Results     None        Procedures/Radiology performed:   Radiology: all results from this admission  Xr Chest 2 Views    Result Date: 02/02/2017    Normal chest film. Annamary Carolin, MD 02/02/2017 1:51 PM    US Venous Dopp Lower Etrem Bilat    Result Date: 02/02/2017  No evidence of deep venous thrombosis in the lower extremities. Genevieve Norlander, MD 02/02/2017 4:32 PM    Ct Abd/ Pelvis With Iv And Po Contrast    Result Date: 02/02/2017   Indeterminate 5.1 cm left adnexal lesion, not simple cystic. Recommend follow-up pelvic ultrasound. Rocky Crafts, MD 02/02/2017 4:56 PM    US Pelvic W Transvaginal    Result Date: 02/03/2017  1. Fibroid uterus. Pedunculated left uterine fibroid corresponds to the adnexal mass seen on CT scan. 2. Nonvisualized ovaries without adnexal mass identified. Colonel Bald, MD 02/03/2017 12:48 PM      Hospital Course:   Reason for admission/ HPI: [name] [name] is a 51 y.o. femalewas admitted under observation for chest, abdominal, and leg pain     Past medical history includes:  has a past medical history of Diabetes mellitus and Hypertension.    Hospital Course:   [name] [name] is a 51 y.o. female admitted 02/02/2017 to observation for chest, abdomen, and leg pain.  Serial troponins were negative.  EKG non-ischemic.  Tachycardic with activity.  IMG consulted and recommended d/cing diltiazem upon discharge and resuming lopressor 25mg  PO BID and norvasc 5mg  daily as well as starting chlorthalidone 25mg  daily for BP control. ECHO can't be done until late tonight, can be done as an outpatient.     #Uncontrolled DMII  Endocrinology consulted for a1c of 8.9 and elevated BG.  They recumended humalog 14 units TID AC, NPH 70/30 20 units BID.  ILGF in process for concern of acromegaly.  She will continue to follow up with TCM and endocrinology as an OP.    #AKI   Creatinine elevated to 1.4 and decreased to 1.1.  Per Care everywhere  notes, creatinine was 0.8 earlier this month.  Could be elevated d/t hyperglycemia, HTN, and possibly dehydration d/t n/v.  She will continue to be monitored by TCM.    #ABD Pain  GI consulted for abdominal pain that feels similar to your usual gastroparesis pain.  She has a hx of PUD and gastroparesis. Abodominal cramping persists, but she will go home on PO reglan 5mg  TID, bentyl QID, and gabapentin 300mg  TID for neuropathy symptoms.   She will continue to follow up with GI as an OP and they will consider an upper endoscopy.  A pelvic US revealed a fibroid uterus.  She will follow up with GYN as an OP.    #BLE Pain  Venous doppler US of BLE negative for DVT.  CK level WNL. Magnesium was low at 1.4 and repleted.  Leg pain has improved.  Gabapentin 300mg  PO TID prescribed for presumed neuropathy symptoms.  She will continue to follow up with TCM.      VSS.   Currently the patient is hemodynamically stable for discharge with outpatient follow up as outlined below.    Discharge Day Exam:  DISCHARGE DAY EXAM:  BP 127/71   Pulse 84   Temp 97.3 F (36.3 C) (Oral)   Resp 19   Ht 1.676 m (5\' 6" )   Wt 133.8 kg (295 lb)   SpO2 97%   BMI 47.61 kg/m   Body mass index is 47.61 kg/m.  General: awake, alert, oriented x 3; no acute distress.  Cardiovascular: regular rate and rhythm, no murmurs, rubs or gallops  Lungs: clear to auscultation bilaterally, without wheezing, rhonchi, or rales  Abdomen: soft, non-tender, non-distended; no palpable masses, no hepatosplenomegaly, normoactive bowel sounds, no rebound or guarding  Extremities: no clubbing, cyanosis, or edema    Discharge Instructions:   Diet:: diabetic, small frequent meals, low fat.  Activity: as tolerated  Follow-up Information     Concha Norway, MD Follow up.    Specialties:  Cardiology, Interventional Cardiology  Why:  Follow up within one month  Contact information:  [address]  [place] [address]  [phone]             [facility]. Go on 02/15/2017.    Why:  Follow up at 8:15AM. [employer] card (if insured) and photo ID, Medications in their original bottlesm Glucometer/blood sugar log (if diabetic), Proof of income (to enroll in medication assistance programs-first two pages of signed 1040 tax forms   Contact information:  [address]  [place] [address]  [phone]           Kenney Houseman, MD Follow up.    Specialties:  Gastroenterology, Internal Medicine  Why:  Call to make an appointment within one week.  They will help schedule you for an upper endoscopy    Contact information:  [address]  [address] [address]  [phone]             de Dinah Beers, Garth Schlatter, NP Follow up on 02/25/2017.    Specialty:  Nurse Practitioner  Why:  Diabetes hospital follow up appointment time 9:00 AM .  Present to [address] NOT [address].   Contact information:  [address]  [address]  [place] [address]  [phone]             Gynecology Follow up.    Why:  Follow up with the gynecologist regarding fibroids.                 Plan discussed with attending physician: Garth Bigness,  Signed by: Brock Bad, FNP-C  [facility]   ADULT OBSERVATION UNIT [phone] F: [phone]  CC: Pcp, Noneorunknown, MD  Please see attending note that follows this mid-level encounter note.

## 2017-02-04 NOTE — Plan of Care (Signed)
Problem: Safety  Goal: Patient will be free from injury during hospitalization  Outcome: Progressing   02/03/17 0430   Goal/Interventions addressed this shift   Patient will be free from injury during hospitalization  Assess patient's risk for falls and implement fall prevention plan of care per policy;Use appropriate transfer methods;Provide and maintain safe environment;Hourly rounding     Goal: Patient will be free from infection during hospitalization  Outcome: Progressing   02/03/17 0430   Goal/Interventions addressed this shift   Free from Infection during hospitalization Assess and monitor for signs and symptoms of infection;Monitor lab/diagnostic results;Monitor all insertion sites (i.e. indwelling lines, tubes, urinary catheters, and drains);Encourage patient and family to use good hand hygiene technique       Problem: Pain  Goal: Pain at adequate level as identified by patient  Outcome: Progressing   02/03/17 1451   Goal/Interventions addressed this shift   Pain at adequate level as identified by patient Identify patient comfort function goal;Assess for risk of opioid induced respiratory depression, including snoring/sleep apnea. Alert healthcare team of risk factors identified.;Assess pain on admission, during daily assessment and/or before any "as needed" intervention(s);Reassess pain within 30-60 minutes of any procedure/intervention, per Pain Assessment, Intervention, Reassessment (AIR) Cycle;Evaluate if patient comfort function goal is met;Evaluate patient's satisfaction with pain management progress;Offer non-pharmacological pain management interventions;Consult/collaborate with Pain Service;Consult/collaborate with Physical Therapy, Occupational Therapy, and/or Speech Therapy;Include patient/patient care companion in decisions related to pain management as needed       Problem: Side Effects from Pain Analgesia  Goal: Patient will experience minimal side effects of analgesic therapy  Outcome:  Progressing   02/03/17 0430   Goal/Interventions addressed this shift   Patient will experience minimal side effects of analgesic therapy Monitor/assess patient's respiratory status (RR depth, effort, breath sounds);Assess for changes in cognitive function;Prevent/manage side effects per LIP orders (i.e. nausea, vomiting, pruritus, constipation, urinary retention, etc.)       Problem: Discharge Barriers  Goal: Patient will be discharged home or other facility with appropriate resources  Outcome: Progressing   02/03/17 1451   Goal/Interventions addressed this shift   Discharge to home or other facility with appropriate resources Provide appropriate patient education;Provide information on available health resources;Initiate discharge planning       Problem: Psychosocial and Spiritual Needs  Goal: Demonstrates ability to cope with hospitalization/illness  Outcome: Progressing   02/03/17 1451   Goal/Interventions addressed this shift   Demonstrates ability to cope with hospitalizations/illness Encourage verbalization of feelings/concerns/expectations;Provide quiet environment;Assist patient to identify own strengths and abilities;Encourage patient to set small goals for self;Encourage participation in diversional activity;Reinforce positive adaptation of new coping behaviors;Include patient/ patient care companion in decisions;Communicate referral to spiritual care as appropriate       Problem: Moderate/High Fall Risk Score >5  Goal: Patient will remain free of falls  Outcome: Progressing   02/03/17 2130   OTHER   Moderate Risk (6-13) MOD-Initiate Yellow "Fall Risk" magnet communication tool;MOD-Consider activation of bed alarm if appropriate;MOD-Apply bed exit alarm if patient is confused;MOD-Floor mat at bedside (where available) if appropriate;MOD-Use of chair-pad alarm when appropriate;MOD-Consider a move closer to Nurses Station;MOD-Remain with patient during toileting;MOD-Use of assistive devices-bedside commode  if appropriate;MOD-Place bedside commode and assistive devices out of sight when not in use;MOD-Re-orient confused patients;MOD-Utilize diversion activities;MOD-Request PT/OT consult order for patients with gait/mobility impairment;MOD-Place Fall Risk level on whiteboard in room;MOD-include family in multidisciplinary POC discussions       Problem: Chest Pain  Goal: Vital signs and cardiac rhythm  stable  Outcome: Progressing   02/03/17 1451   Goal/Interventions addressed this shift   Vital signs and cardiac rhythm stable Monitor Earnest Bailey vital signs/cardiac rhythms;Monitor labs;Assess the need for oxygen therapy and administer as ordered     Goal: Cardiac pain management  Outcome: Progressing   02/03/17 1451   Goal/Interventions addressed this shift   Cardiac pain management  Assess/report chest pain/or related discomfort to LIP immediately;Instruct patient to report any change in pain status;Assess pain/or related discomfort on admission, during daily assessment, before and after any intervention;Include patient and patient care companion in decisions related to pain management       Problem: Altered GI Function  Goal: Fluid and electrolyte balance are achieved/maintained  Outcome: Progressing   02/04/17 0054   Goal/Interventions addressed this shift   Fluid and electrolyte balance are achieved/maintained Monitor intake and output every shift;Monitor/assess lab values and report abnormal values;Provide adequate hydration;Assess for confusion/personality changes;Assess and reassess fluid and electrolyte status;Monitor daily weight       Comments: Patient A&Ox4, vss, denies sob, dizziness. C/o n/v and 8/10 abdominal pain, given Prn morphine and Zofran with relief. NSR to sinus tachy when ambulating on tele. Pain management and glucose control goals. 1A when OOB. Patient update on plan of care, will continue to monitor and assess.

## 2017-02-06 LAB — INSULIN-LIKE GROWTH FACTOR
IGF-I, ECL: 127 ng/mL (ref 50–317)
Z-Score (Female): -0.2 (ref ?–2.0)

## 2017-02-07 ENCOUNTER — Observation Stay
Admission: EM | Admit: 2017-02-07 | Discharge: 2017-02-08 | Disposition: A | Discharge status: Home / Self Care | Payer: Self-pay | Attending: Family Medicine | Admitting: Family Medicine

## 2017-02-07 ENCOUNTER — Emergency Department: Payer: Charity

## 2017-02-07 DIAGNOSIS — E1143 Type 2 diabetes mellitus with diabetic autonomic (poly)neuropathy: Principal | ICD-10-CM | POA: Insufficient documentation

## 2017-02-07 DIAGNOSIS — R1111 Vomiting without nausea: Secondary | ICD-10-CM

## 2017-02-07 DIAGNOSIS — T730XXA Starvation, initial encounter: Secondary | ICD-10-CM

## 2017-02-07 DIAGNOSIS — E8729 Other acidosis: Secondary | ICD-10-CM

## 2017-02-07 DIAGNOSIS — N179 Acute kidney failure, unspecified: Secondary | ICD-10-CM | POA: Insufficient documentation

## 2017-02-07 DIAGNOSIS — E876 Hypokalemia: Secondary | ICD-10-CM | POA: Insufficient documentation

## 2017-02-07 DIAGNOSIS — I10 Essential (primary) hypertension: Secondary | ICD-10-CM | POA: Insufficient documentation

## 2017-02-07 DIAGNOSIS — K3184 Gastroparesis: Secondary | ICD-10-CM | POA: Insufficient documentation

## 2017-02-07 DIAGNOSIS — Z794 Long term (current) use of insulin: Secondary | ICD-10-CM | POA: Insufficient documentation

## 2017-02-07 DIAGNOSIS — E872 Acidosis: Secondary | ICD-10-CM

## 2017-02-07 DIAGNOSIS — K760 Fatty (change of) liver, not elsewhere classified: Secondary | ICD-10-CM | POA: Insufficient documentation

## 2017-02-07 DIAGNOSIS — E1165 Type 2 diabetes mellitus with hyperglycemia: Secondary | ICD-10-CM | POA: Insufficient documentation

## 2017-02-07 DIAGNOSIS — E1110 Type 2 diabetes mellitus with ketoacidosis without coma: Secondary | ICD-10-CM | POA: Insufficient documentation

## 2017-02-07 HISTORY — DX: Unspecified ovarian cyst, unspecified side: N83.209

## 2017-02-07 LAB — URINALYSIS, REFLEX TO MICROSCOPIC EXAM IF INDICATED
Bilirubin, UA: NEGATIVE
Glucose, UA: 150 — AB
Ketones UA: 5 — AB
Leukocyte Esterase, UA: NEGATIVE
Nitrite, UA: NEGATIVE
Protein, UR: 100 — AB
Specific Gravity UA: 1.023 (ref 1.001–1.035)
Urine pH: 6 (ref 5.0–8.0)
Urobilinogen, UA: NORMAL mg/dL

## 2017-02-07 LAB — CBC AND DIFFERENTIAL
Absolute NRBC: 0 10*3/uL
Basophils Absolute Automated: 0.06 10*3/uL (ref 0.00–0.20)
Basophils Automated: 0.6 %
Eosinophils Absolute Automated: 0.13 10*3/uL (ref 0.00–0.70)
Eosinophils Automated: 1.2 %
Hematocrit: 33 % — ABNORMAL LOW (ref 37.0–47.0)
Hgb: 10.6 g/dL — ABNORMAL LOW (ref 12.0–16.0)
Immature Granulocytes Absolute: 0.12 10*3/uL — ABNORMAL HIGH
Immature Granulocytes: 1.1 %
Lymphocytes Absolute Automated: 2.92 10*3/uL (ref 0.50–4.40)
Lymphocytes Automated: 27.8 %
MCH: 28.3 pg (ref 28.0–32.0)
MCHC: 32.1 g/dL (ref 32.0–36.0)
MCV: 88.2 fL (ref 80.0–100.0)
MPV: 11.1 fL (ref 9.4–12.3)
Monocytes Absolute Automated: 0.58 10*3/uL (ref 0.00–1.20)
Monocytes: 5.5 %
Neutrophils Absolute: 6.71 10*3/uL (ref 1.80–8.10)
Neutrophils: 63.8 %
Nucleated RBC: 0 /100 WBC (ref 0.0–1.0)
Platelets: 330 10*3/uL (ref 140–400)
RBC: 3.74 10*6/uL — ABNORMAL LOW (ref 4.20–5.40)
RDW: 14 % (ref 12–15)
WBC: 10.52 10*3/uL (ref 3.50–10.80)

## 2017-02-07 LAB — GLUCOSE WHOLE BLOOD - POCT
Whole Blood Glucose POCT: 319 mg/dL — ABNORMAL HIGH (ref 70–100)
Whole Blood Glucose POCT: 255 mg/dL — ABNORMAL HIGH (ref 70–100)
Whole Blood Glucose POCT: 314 mg/dL — ABNORMAL HIGH (ref 70–100)
Whole Blood Glucose POCT: 214 mg/dL — ABNORMAL HIGH (ref 70–100)
Whole Blood Glucose POCT: 178 mg/dL — ABNORMAL HIGH (ref 70–100)
Whole Blood Glucose POCT: 183 mg/dL — ABNORMAL HIGH (ref 70–100)
Whole Blood Glucose POCT: 206 mg/dL — ABNORMAL HIGH (ref 70–100)
Whole Blood Glucose POCT: 207 mg/dL — ABNORMAL HIGH (ref 70–100)

## 2017-02-07 LAB — ECG 12-LEAD
Atrial Rate: 113 {beats}/min
Atrial Rate: 113 {beats}/min
P Axis: 56 degrees
P Axis: 56 degrees
P-R Interval: 152 ms
P-R Interval: 152 ms
Q-T Interval: 352 ms
Q-T Interval: 352 ms
QRS Duration: 80 ms
QRS Duration: 80 ms
QTC Calculation (Bezet): 482 ms
QTC Calculation (Bezet): 482 ms
R Axis: -51 degrees
R Axis: -51 degrees
T Axis: 68 degrees
T Axis: 68 degrees
Ventricular Rate: 113 {beats}/min
Ventricular Rate: 113 {beats}/min

## 2017-02-07 LAB — I-STAT CG4 VENOUS CARTRIDGE
Lactic Acid I-Stat: 2.2 mmol/L — ABNORMAL HIGH (ref 0.2–2.0)
i-STAT Base Excess Venous: 4 mEq/L
i-STAT FIO2: 21
i-STAT HCO3 Bicarbonate Venous: 24.6 mEq/L
i-STAT O2 Saturation Venous: 58 %
i-STAT Patient Temperature: 98
i-STAT Total CO2 Venous: 25 mEq/L
i-STAT pCO2 Venous: 24.9
i-STAT pH Venous: 7.601
i-STAT pO2 Venous: 24

## 2017-02-07 LAB — BASIC METABOLIC PANEL
BUN: 19 mg/dL (ref 7.0–19.0)
BUN: 16 mg/dL (ref 7.0–19.0)
CO2: 22 mEq/L (ref 22–29)
CO2: 23 mEq/L (ref 22–29)
Calcium: 8.7 mg/dL (ref 8.5–10.5)
Calcium: 8.6 mg/dL (ref 8.5–10.5)
Chloride: 100 mEq/L (ref 100–111)
Chloride: 104 mEq/L (ref 100–111)
Creatinine: 1.1 mg/dL — ABNORMAL HIGH (ref 0.6–1.0)
Creatinine: 1 mg/dL (ref 0.6–1.0)
Glucose: 174 mg/dL — ABNORMAL HIGH (ref 70–100)
Glucose: 178 mg/dL — ABNORMAL HIGH (ref 70–100)
Potassium: 3.1 mEq/L — ABNORMAL LOW (ref 3.5–5.1)
Potassium: 3.7 mEq/L (ref 3.5–5.1)
Sodium: 136 mEq/L (ref 136–145)
Sodium: 138 mEq/L (ref 136–145)

## 2017-02-07 LAB — GFR
EGFR: 47.6
EGFR: >60
EGFR: >60

## 2017-02-07 LAB — COMPREHENSIVE METABOLIC PANEL
ALT: 14 U/L (ref 0–55)
AST (SGOT): 15 U/L (ref 5–34)
Albumin/Globulin Ratio: 0.9 (ref 0.9–2.2)
Albumin: 3.4 g/dL — ABNORMAL LOW (ref 3.5–5.0)
Alkaline Phosphatase: 107 U/L — ABNORMAL HIGH (ref 37–106)
BUN: 22 mg/dL — ABNORMAL HIGH (ref 7.0–19.0)
Bilirubin, Total: 0.5 mg/dL (ref 0.2–1.2)
CO2: 19 mEq/L — ABNORMAL LOW (ref 22–29)
Calcium: 9.3 mg/dL (ref 8.5–10.5)
Chloride: 99 mEq/L — ABNORMAL LOW (ref 100–111)
Creatinine: 1.4 mg/dL — ABNORMAL HIGH (ref 0.6–1.0)
Globulin: 3.6 g/dL (ref 2.0–3.6)
Glucose: 346 mg/dL — ABNORMAL HIGH (ref 70–100)
Potassium: 3.6 mEq/L (ref 3.5–5.1)
Protein, Total: 7 g/dL (ref 6.0–8.3)
Sodium: 135 mEq/L — ABNORMAL LOW (ref 136–145)

## 2017-02-07 LAB — LIPASE: Lipase: 29 U/L (ref 8–78)

## 2017-02-07 MED ORDER — INSULIN REGULAR HUMAN 100 UNIT/ML IJ SOLN
0.10 [IU]/kg/h | INTRAMUSCULAR | Status: DC
Start: 2017-02-07 — End: 2017-02-07
  Administered 2017-02-07: 16:00:00 13.3 [IU]/h via INTRAVENOUS
  Filled 2017-02-07: qty 1

## 2017-02-07 MED ORDER — METOPROLOL TARTRATE 25 MG PO TABS
25.00 mg | ORAL_TABLET | Freq: Two times a day (BID) | ORAL | Status: DC
Start: 2017-02-07 — End: 2017-02-08
  Administered 2017-02-08: 11:00:00 25 mg via ORAL
  Filled 2017-02-07: qty 1

## 2017-02-07 MED ORDER — INSULIN REGULAR HUMAN 100 UNIT/ML IJ SOLN
6.00 [IU] | Freq: Once | INTRAMUSCULAR | Status: AC
Start: 2017-02-07 — End: 2017-02-07
  Administered 2017-02-07: 16:00:00 6 [IU] via INTRAVENOUS
  Filled 2017-02-07: qty 3

## 2017-02-07 MED ORDER — METOCLOPRAMIDE HCL 5 MG/ML IJ SOLN
5.00 mg | Freq: Four times a day (QID) | INTRAMUSCULAR | Status: DC | PRN
Start: 2017-02-07 — End: 2017-02-08

## 2017-02-07 MED ORDER — HALOPERIDOL LACTATE 5 MG/ML IJ SOLN
5.00 mg | Freq: Once | INTRAMUSCULAR | Status: AC
Start: 2017-02-07 — End: 2017-02-07
  Administered 2017-02-07: 16:00:00 5 mg via INTRAVENOUS
  Filled 2017-02-07: qty 1

## 2017-02-07 MED ORDER — GABAPENTIN 300 MG PO CAPS
300.00 mg | ORAL_CAPSULE | Freq: Three times a day (TID) | ORAL | Status: DC
Start: 2017-02-07 — End: 2017-02-08
  Administered 2017-02-08 (×2): 300 mg via ORAL
  Filled 2017-02-07 (×2): qty 1

## 2017-02-07 MED ORDER — PANTOPRAZOLE SODIUM 40 MG PO TBEC
40.00 mg | DELAYED_RELEASE_TABLET | Freq: Every morning | ORAL | Status: DC
Start: 2017-02-08 — End: 2017-02-08
  Administered 2017-02-08: 09:00:00 40 mg via ORAL
  Filled 2017-02-07: qty 1

## 2017-02-07 MED ORDER — INSULIN LISPRO 100 UNIT/ML SC SOLN
1.00 [IU] | Freq: Every evening | SUBCUTANEOUS | Status: DC | PRN
Start: 2017-02-07 — End: 2017-02-08

## 2017-02-07 MED ORDER — DEXTROSE 50 % IV SOLN
12.50 g | INTRAVENOUS | Status: DC | PRN
Start: 2017-02-07 — End: 2017-02-08

## 2017-02-07 MED ORDER — ONDANSETRON HCL 4 MG/2ML IJ SOLN
4.00 mg | Freq: Once | INTRAMUSCULAR | Status: AC
Start: 2017-02-07 — End: 2017-02-07
  Administered 2017-02-07: 14:00:00 4 mg via INTRAVENOUS
  Filled 2017-02-07: qty 2

## 2017-02-07 MED ORDER — GLUCOSE 40 % PO GEL
15.00 g | ORAL | Status: DC | PRN
Start: 2017-02-07 — End: 2017-02-08

## 2017-02-07 MED ORDER — INSULIN LISPRO 100 UNIT/ML SC SOLN
1.00 [IU] | Freq: Three times a day (TID) | SUBCUTANEOUS | Status: DC | PRN
Start: 2017-02-07 — End: 2017-02-08
  Administered 2017-02-08: 13:00:00 5 [IU] via SUBCUTANEOUS
  Filled 2017-02-07: qty 15

## 2017-02-07 MED ORDER — ONDANSETRON 4 MG PO TBDP
4.00 mg | ORAL_TABLET | Freq: Four times a day (QID) | ORAL | Status: DC | PRN
Start: 2017-02-07 — End: 2017-02-08

## 2017-02-07 MED ORDER — POTASSIUM CHLORIDE IN NACL 20-0.9 MEQ/L-% IV SOLN
INTRAVENOUS | Status: DC
Start: 2017-02-07 — End: 2017-02-07

## 2017-02-07 MED ORDER — ONDANSETRON HCL 4 MG/2ML IJ SOLN
4.00 mg | Freq: Four times a day (QID) | INTRAMUSCULAR | Status: DC | PRN
Start: 2017-02-07 — End: 2017-02-08

## 2017-02-07 MED ORDER — MORPHINE SULFATE 2 MG/ML IJ/IV SOLN (WRAP)
2.0000 mg | Status: DC | PRN
Start: 2017-02-07 — End: 2017-02-08
  Administered 2017-02-08 (×2): 2 mg via INTRAVENOUS
  Filled 2017-02-07 (×2): qty 1

## 2017-02-07 MED ORDER — DULOXETINE HCL 60 MG PO CPEP
60.00 mg | ORAL_CAPSULE | Freq: Two times a day (BID) | ORAL | Status: DC
Start: 2017-02-07 — End: 2017-02-08
  Administered 2017-02-08: 11:00:00 60 mg via ORAL
  Filled 2017-02-07 (×3): qty 1

## 2017-02-07 MED ORDER — ENOXAPARIN SODIUM 40 MG/0.4ML SC SOLN
40.00 mg | Freq: Every day | SUBCUTANEOUS | Status: DC
Start: 2017-02-08 — End: 2017-02-08
  Administered 2017-02-08: 11:00:00 40 mg via SUBCUTANEOUS
  Filled 2017-02-07: qty 0.4

## 2017-02-07 MED ORDER — KCL IN DEXTROSE-NACL 20-5-0.9 MEQ/L-%-% IV SOLN
INTRAVENOUS | Status: DC
Start: 2017-02-07 — End: 2017-02-08

## 2017-02-07 MED ORDER — MORPHINE SULFATE 4 MG/ML IJ/IV SOLN (WRAP)
4.0000 mg | Freq: Once | Status: AC
Start: 2017-02-07 — End: 2017-02-07
  Administered 2017-02-07: 14:00:00 4 mg via INTRAVENOUS
  Filled 2017-02-07: qty 1

## 2017-02-07 MED ORDER — AMLODIPINE BESYLATE 5 MG PO TABS
5.00 mg | ORAL_TABLET | Freq: Every day | ORAL | Status: DC
Start: 2017-02-08 — End: 2017-02-08
  Administered 2017-02-08: 11:00:00 5 mg via ORAL
  Filled 2017-02-07: qty 1

## 2017-02-07 MED ORDER — IOHEXOL 350 MG/ML IV SOLN
100.00 mL | Freq: Once | INTRAVENOUS | Status: AC | PRN
Start: 2017-02-07 — End: 2017-02-07
  Administered 2017-02-07: 17:00:00 100 mL via INTRAVENOUS

## 2017-02-07 MED ORDER — SODIUM CHLORIDE 0.9 % IV BOLUS
1000.00 mL | Freq: Once | INTRAVENOUS | Status: AC
Start: 2017-02-07 — End: 2017-02-07
  Administered 2017-02-07: 15:00:00 1000 mL via INTRAVENOUS

## 2017-02-07 MED ORDER — NALOXONE HCL 0.4 MG/ML IJ SOLN (WRAP)
0.20 mg | INTRAMUSCULAR | Status: DC | PRN
Start: 2017-02-07 — End: 2017-02-08

## 2017-02-07 MED ORDER — GLUCAGON 1 MG IJ SOLR (WRAP)
1.00 mg | INTRAMUSCULAR | Status: DC | PRN
Start: 2017-02-07 — End: 2017-02-08

## 2017-02-07 NOTE — ED Notes (Signed)
Bed: N 28  Expected date:   Expected time:   Means of arrival:   Comments:  34 here

## 2017-02-07 NOTE — EDIE (Signed)
[name]?NOTIFICATION?02/07/2017 13:40?[name], [name]?MRN: [id number]    This patient has registered at the [facility] Emergency Department   For more information visit: [email or web address]   Criteria met      5 ED Visits in 12 Months    Security Events  No recent Security Events currently on file    ED Care Guidelines  There are currently no ED Care Guidelines in [name] for this patient. Please check your facility's medical records system.    Care Providers  Bexlee Bergdoll has no care providers on record at this time.   E.D. Visit Count (12 mo.)  Facility Visits   [facility] 7   [facility] 2   Total 9   Note: Visits indicate total known visits.      Recent Emergency Department Visit Summary  Admit Date Facility [facility] Type Major Type Diagnoses or Chief Complaint   Feb 07, 2017 [place] [facility] Emergency  Emergency      abd pain      Feb 05, 2017 [facility] [place] [facility]  Emergency     Feb 05, 2017 [facility] [place] Emergency  Emergency      Abdominal Pain      Feb 02, 2017 [place] [facility] [place] [facility] [place] [place] Emergency  Emergency      CP      Chest Pain      Jan 27, 2017 [facility] [place] Emergency  Emergency     Jan 27, 2017 [facility] [place] Emergency  Emergency      chest pain diff breathing      Chest Pain      Pneumonia, unspecified organism      Nov 26, 2016 [facility] [place] Emergency  Emergency     Nov 26, 2016 [facility] [place] Emergency  Emergency     Nov 26, 2016 [facility] [place] Emergency  Emergency      Chest pain      [name]      Chest pain, unspecified      [name]          Recent Inpatient Visit Summary  Admit Date Facility [facility] Type Major Type Diagnoses or Chief  Complaint   Feb 02, 2017 [place] [facility] [place] [place] [facility] [place] Inpatient  Inpatient      Chest pain, unspecified            Prescription Monitoring Program  000??- Narcotic Use Score  000??- Sedative Use Score  000??- Stimulant Use Score  - All Scores range from 000-999 with 75% of the population scoring < 200 and on 1% scoring above 650  - The last digit of the narcotic, sedative, and stimulant score indicates the number of active prescriptions of that type  - Higher Use scores correlate with increased prescribers, pharmacies, mg equiv, and overlapping prescriptions   Concerning or unexpectedly high scores should prompt a review of the [name] record; this does not constitute checking [name] for prescribing purposes.    The above information is provided for the sole purpose of patient treatment. Use of this information beyond the terms of Data Sharing Memorandum of Understanding and License Agreement is prohibited. In certain cases not all visits may be  represented. Consult the aforementioned facilities for additional information.   ? 2018 [place], [place] - [email or web address]

## 2017-02-07 NOTE — ED Provider Notes (Signed)
I have briefly evaluated this patient as triage physician in order to facilitate and initiate the ordering of laboratory and imaging studies as needed.    52 yo F w/ abdominal pain.  Patient states started acutely 2 hours PTA.  Indicates pain to suprapubic region    No peritoneal signs.  Bedside ultrasound no free fluid noted.  Unable to appreciate aorta given body habitus but no large AAA.      Etiology unclear, obtained immediate room for further evaluation.  1400.     Olevia Bowens, MD  02/07/17 [phone]

## 2017-02-07 NOTE — ED Provider Notes (Signed)
[facility] EMERGENCY DEPARTMENT H&P      Visit date: 02/07/2017      CLINICAL SUMMARY           Diagnosis:    .     Final diagnoses:   Diabetic ketoacidosis without coma associated with type 2 diabetes mellitus   Non-intractable vomiting without nausea, unspecified vomiting type         MDM Notes:    Patient with history of insulin-dependent diabetes and recent admission for gastroparesis/AKI/DKA presents with episode of vomiting and abdominal pain and CP.  BS = 319, AG = 17 and concern for DKA, given elevated blood sugar and anion gap  EKG with sinus tachycardia and troponin negative, low concern for ACS  Patient given IV fluid, insulin bolus + GTT, morphine for pain which did not improve symptoms, given IV Haldol with improvement as well as Zofran  Blood sugar is improved, but continues to have AG  CT with no acute process  Will admit for further management           Disposition:         Observation Admit      ED Disposition     ED Disposition Condition Date/Time Comment    Observation  Mon Feb 07, 2017  5:26 PM Admitting Physician: Candiss Norse [[id number]]   Diagnosis: DKA (diabetic ketoacidoses) [914782]   Estimated Length of Stay: < 2 midnights   Tentative Discharge Plan?: Home or Self Care [1]   Patient Class: Observation [104]                        CLINICAL INFORMATION        HPI:      Chief Complaint: Abdominal Pain  .    [name] [name] is a 51 y.o. female with a PMHx of uncontrolled diabetic gastroparesis, HTN; BIBA fro mhome who presents with worsening of her chronic abdominal pain and chest pain that started 1 hour ago.     Patient was recently admitted from 11/28 - 11/30 for abdominal pain, chest pain, and leg pain. same symptoms. a1c of 8.9 and elevated BG. US revealed uterine fibroids. She was overall doing well upon discharge but started having recurrent, severe abdominal pain for the past 1 hour. A/w nausea and vomiting. Pain is cramping/stabbing in nature,  radiating up to her chest.     Denies fevers, dizziness, SOB, syncope.     History obtained from: patient, review of prior chart        ROS:      Review of Systems   Constitutional: Negative for chills and fever.   Respiratory: Negative for chest tightness and shortness of breath.    Cardiovascular: Positive for chest pain.   Gastrointestinal: Positive for abdominal pain, nausea and vomiting.   Genitourinary: Negative for difficulty urinating and dysuria.   Musculoskeletal: Negative for arthralgias and myalgias.   Skin: Negative for color change and rash.   Neurological: Negative for dizziness, syncope, weakness and headaches.           Physical Exam:      Pulse (!) 117  BP 141/89  Resp 15  SpO2 100 %  Temp 97.8 F (36.6 C)    Physical Exam   Constitutional: She is oriented to person, place, and time. She appears well-developed and well-nourished.   HENT:   Head: Normocephalic and atraumatic.   Eyes: Pupils are equal, round, and reactive to light. Conjunctivae and EOM are  normal.   Neck: Normal range of motion. Neck supple.   Cardiovascular: Normal rate, regular rhythm and intact distal pulses.    Pulmonary/Chest: Effort normal and breath sounds normal.   Abdominal: Soft. Bowel sounds are normal.   Musculoskeletal: Normal range of motion.   Neurological: She is alert and oriented to person, place, and time.   Skin: Skin is warm and dry.   Psychiatric: She has a normal mood and affect.   Vitals reviewed.     Mod distress  Diffuse ab tend              PAST HISTORY        Primary Care Provider: Christa See, MD        PMH/PSH:    .     Past Medical History:   Diagnosis Date   . Diabetes mellitus    . Hypertension    . Ovarian cyst        She has no past surgical history on file.      Social/Family History:      She reports that she has never smoked. She has never used smokeless tobacco. Her alcohol and drug histories are not on file.    No family history on file.      Listed Medications on Arrival:    .      Home Medications     Med List Status:  In Progress Set By: Calton Dach, RN at 02/07/2017  5:35 PM                ALLOPURINOL PO     Take 1 tablet by mouth daily.         amLODIPine (NORVASC) 5 MG tablet     Take 1 tablet (5 mg total) by mouth daily.     ATORVASTATIN CALCIUM PO     Take 1 tablet by mouth daily.     Blood Glucose Monitoring Suppl (RELION CONFIRM GLUCOSE MONITOR) w/Device Kit     Check your blood glucose before each meal 2-3 times daily.     chlorthalidone (HYGROTEN) 25 MG tablet     Take 1 tablet (25 mg total) by mouth daily.     dicyclomine (BENTYL) 10 MG capsule     Take 1 capsule (10 mg total) by mouth 4 (four) times daily.     DULoxetine (CYMBALTA) 60 MG capsule     Take 60 mg by mouth 2 (two) times daily.     gabapentin (NEURONTIN) 300 MG capsule     Take 1 capsule (300 mg total) by mouth every 8 (eight) hours.     glucose blood test strip     Check your blood glucose before each meal 2-3 times daily.     insulin lispro (HUMALOG) 100 UNIT/ML injection     Inject 14 Units into the skin 3 (three) times daily before meals.     insulin NPH-regular 70-30 MIXTURE (HUMULIN 70/30,NOVOLIN 70/30) (70-30) 100 UNIT/ML injection     Inject 40 Units into the skin 2 (two) times daily before meals.     Insulin Syringe-Needle U-100 27G X 1/2" 0.5 ML Misc     1 each by Does not apply route 5 (five) times daily.     Lancets 30G Misc     Check your blood glucose before each meal 2-3 times daily.     metoclopramide (REGLAN) 5 MG tablet     Take 1 tablet (5 mg total) by mouth 4 (four) times  daily.     metoprolol tartrate (LOPRESSOR) 25 MG tablet     Take 1 tablet (25 mg total) by mouth every 12 (twelve) hours.     pantoprazole (PROTONIX) 40 MG tablet     Take 1 tablet (40 mg total) by mouth 2 (two) times daily.         Allergies: She is allergic to tramadol; lisinopril; penicillins; and valium [diazepam].            VISIT INFORMATION        Clinical Course in the ED:          ED Course as of Feb 07 1813    Mon Feb 07, 2017   1727 D/w hospitalist, Dr. Penni Homans. Accepts patient for admission.   [SF]      ED Course User Index  [SF] Jola Schmidt       Critical Care Time(not including procedures): 60 minutes.   Due to the high risk of critical illness or multi-organ failure at initial presentation and/or during ED course.    System(s) at risk for compromise:  circulatory and metabolic  Critical Diagnosis:   1. Diabetic ketoacidosis without coma associated with type 2 diabetes mellitus    2. Non-intractable vomiting without nausea, unspecified vomiting type         The patient was Hypotensive:   No     The patient was Hypoxic:   No     This does not including time spent performing other reported procedures or services.   Critical care time involved full attention to the patient's condition and included:   Review of nursing notes and/or old charts - Yes  Documentation time - Yes  Care, transfer of care, and discharge plans - Yes  Obtaining necessary history from family, EMS, nursing home staff and/or treating physicians - Yes  Review of medications, allergies, and vital signs - Yes   Consultant collaboration on findings and treatment options - Yes  Ordering, interpreting, and reviewing diagnostic studies/tab tests - Yes           Medications Given in the ED:    .     ED Medication Orders     Start Ordered     Status Ordering Provider    02/07/17 1605 02/07/17 1605  dextrose 5 % and 0.9 % NaCl with KCl 20 mEq infusion  Continuous     Route: Intravenous     Last MAR action:  New Bag ASLANI-AMOLI, [facility]    02/07/17 1545 02/07/17 1544  0.9 % NaCl with KCl 20 mEq infusion  Continuous     Route: Intravenous     Last MAR action:  Not Given Bunnie Philips [facility]    02/07/17 1538 02/07/17 1537  insulin regular (HumuLIN R,NovoLIN R) 100 Units in sodium chloride 0.9 % 100 mL IV infusion  Continuous     Route: Intravenous  Ordered Dose: 0.1 Units/kg/hr     Last MAR action:  No Dual Sign - Rate/Dose Change ASLANI-AMOLI,  [facility]    02/07/17 1509 02/07/17 1508  insulin regular (HumuLIN R,NovoLIN R) injection 6 Units  Once     Route: Intravenous  Ordered Dose: 6 Units     Last MAR action:  Given ASLANI-AMOLI, [facility]    02/07/17 1456 02/07/17 1455  sodium chloride 0.9 % bolus 1,000 mL  Once     Route: Intravenous  Ordered Dose: 1,000 mL     Last MAR action:  Stopped ASLANI-AMOLI, [facility]    02/07/17 1442 02/07/17  1441  haloperidol lactate (HALDOL) injection 5 mg  Once     Route: Intravenous  Ordered Dose: 5 mg     Last MAR action:  Given ASLANI-AMOLI, [facility]    02/07/17 1357 02/07/17 1356  ondansetron (ZOFRAN) injection 4 mg  Once     Route: Intravenous  Ordered Dose: 4 mg     Last MAR action:  Given MYERS, RANDALL TODD    02/07/17 1357 02/07/17 1356  morphine injection 4 mg  Once     Route: Intravenous  Ordered Dose: 4 mg     Last MAR action:  Given MYERS, RANDALL TODD            Procedures:      Procedures      Interpretations:      O2 sat saturation: 100 %; Oxygen use: room air; Interpretation: Normal      EKG interpreted by me: sinus tachycardia at 113. LAD.    Monitor  interpreted by me: sinus tachycardia in 100s.              RESULTS        Lab Results:      Results     Procedure Component Value Units Date/Time    UA, Reflex to Microscopic (pts  3 + yrs) [[id number]] Collected:  02/07/17 1804    Specimen:  Urine Updated:  02/07/17 1803    Basic Metabolic Panel [[id number]] Collected:  02/07/17 1804    Specimen:  Blood Updated:  02/07/17 1803    Glucose Whole Blood - POCT [[id number]]  (Abnormal) Collected:  02/07/17 1649     Updated:  02/07/17 1654     POCT - Glucose Whole blood 214 (H) mg/dL     i-Stat CG4 Venous CartrIDge [[id number]]  (Abnormal) Collected:  02/07/17 1537     Updated:  02/07/17 1621     i-STAT pH Venous 7.601     i-STAT pCO2 Venous 24.9     i-STAT pO2 Venous 24.0     i-STAT HCO3 Bicarbonate Venous 24.6 mEq/L      i-STAT Total CO2 Venous 25.0 mEq/L      i-STAT Base Excess Venous 4.0 mEq/L      i-STAT O2 Saturation  Venous 58.0 %      i-STAT Lactic acid 2.2 (H) mmol/L      i-STAT Patient Temperature 98.0     i-STAT FIO2 21     i-STAT Allen's Test NA     i-STAT Draw Site Venous    Glucose Whole Blood - POCT [[id number]]  (Abnormal) Collected:  02/07/17 1614     Updated:  02/07/17 1617     POCT - Glucose Whole blood 255 (H) mg/dL     Glucose Whole Blood - POCT [[id number]]  (Abnormal) Collected:  02/07/17 1537     Updated:  02/07/17 1548     POCT - Glucose Whole blood 314 (H) mg/dL     CBC with differential [[id number]]  (Abnormal) Collected:  02/07/17 1431    Specimen:  Blood from Blood Updated:  02/07/17 1528     WBC 10.52 x10 3/uL      Hgb 10.6 (L) g/dL      Hematocrit 95.1 (L) %      Platelets 330 x10 3/uL      RBC 3.74 (L) x10 6/uL      MCV 88.2 fL      MCH 28.3 pg      MCHC 32.1 g/dL      RDW  14 %      MPV 11.1 fL      Neutrophils 63.8 %      Lymphocytes Automated 27.8 %      Monocytes 5.5 %      Eosinophils Automated 1.2 %      Basophils Automated 0.6 %      Immature Granulocyte 1.1 %      Nucleated RBC 0.0 /100 WBC      Neutrophils Absolute 6.71 x10 3/uL      Abs Lymph Automated 2.92 x10 3/uL      Abs Mono Automated 0.58 x10 3/uL      Abs Eos Automated 0.13 x10 3/uL      Absolute Baso Automated 0.06 x10 3/uL      Absolute Immature Granulocyte 0.12 (H) x10 3/uL      Absolute NRBC 0.00 x10 3/uL     GFR [[id number]] Collected:  02/07/17 1431     Updated:  02/07/17 1525     EGFR 47.6    Comprehensive metabolic panel [[id number]]  (Abnormal) Collected:  02/07/17 1431    Specimen:  Blood Updated:  02/07/17 1525     Glucose 346 (H) mg/dL      BUN 78.2 (H) mg/dL      Creatinine 1.4 (H) mg/dL      Sodium 956 (L) mEq/L      Potassium 3.6 mEq/L      Chloride 99 (L) mEq/L      CO2 19 (L) mEq/L      Calcium 9.3 mg/dL      Protein, Total 7.0 g/dL      Albumin 3.4 (L) g/dL      AST (SGOT) 15 U/L      ALT 14 U/L      Alkaline Phosphatase 107 (H) U/L      Bilirubin, Total 0.5 mg/dL      Globulin 3.6 g/dL      Albumin/Globulin Ratio 0.9     Lipase [[id number]] Collected:  02/07/17 1431    Specimen:  Blood Updated:  02/07/17 1525     Lipase 29 U/L     Glucose Whole Blood - POCT [[id number]]  (Abnormal) Collected:  02/07/17 1437     Updated:  02/07/17 1452     POCT - Glucose Whole blood 319 (H) mg/dL               Radiology Results:      CT Angio  AAA  Chest/ Abdomen   Final Result      1. Normal thoracic and abdominal CTA. No aortic aneurysm, dissection, or   pulmonary embolism.   2. Fatty liver.      Nelta Numbers, MD    02/07/2017 5:49 PM                  Scribe Attestation:      I was acting as a Neurosurgeon for Allayne Butcher, MD on Pavia,Malyiah  Treatment Team: Scribe: Jola Schmidt     I am the first provider for this patient and I personally performed the services documented. Treatment Team: Scribe: Jola Schmidt is scribing for me on Shepperson,Ocie. This note and the patient instructions accurately reflect work and decisions made by me.  Allayne Butcher, MD            Allayne Butcher, MD  02/09/17 0700

## 2017-02-07 NOTE — Progress Notes (Signed)
02/07/17 1811   CM Review   Acknowledgment of Outpatient/Observation Observation letter given     Letitia Libra, CMA  [facility] - Case Management   [phone]  [phone]

## 2017-02-07 NOTE — H&P (Signed)
ADMISSION HISTORY AND PHYSICAL EXAM    Date Time: 02/07/17 10:12 PM  Patient Name: [name],[name]  Attending Physician: Candiss Norse, *    Assessment:   21 yro woman:    Plan:   Ketoacidosis.  DKA vs. Starvation.  I am thinking the latter since she is a Type 2 and most likely making some amount of insulin on her own, and she has been taking her insulin at home, so unlikely that she would go into DKA.  She has been vomiting a lot this past two weeks, especially today, and some of that could be gastroparesis.  This hx would fit with starvation ketosis.  Gap was only 17 to begin with, and was 14 on recent BMP.  Insulin gtt stopped and rechecking STAT BMP.  Watching lytes, repleting KCL as needed, and will restart her on her home insulin, with SSI.  Recheck BMP in AM.  Repeat Lactate as well.    DMT2, uncontrolled, on long-term insulin, with hyperglycemia.  Home insulin, SSI.    Gastroparesis, diabetic.  Possibly a contributor to her current state.  On zofran and reglan    Hypertension, essential. Home meds, monitor.    PPX. Lovenox.  FEN. NS, KCL, DM diet  Code Status. Full    History of Present Illness:   [name] [name] is a 51 y.o. female who presents to the hospital with vomiting all day, with chest and abdominal pain.  Says that those pains are better now, as is the nausea.  Has had poor appetite and n/v a lot over the last 2 weeks, and was admitted here at [facility] a couple weeks ago.  Was told that it was gastroparesis.  Today has had a lot of vomiting, and has not eaten much.  Feeling better now.    Past Medical History:     Past Medical History:   Diagnosis Date   . Diabetes mellitus    . Hypertension    . Ovarian cyst        Past Surgical History:   Cholecystectomy    Family History:   DM  CHF    Social History:     Social History     Social History   . Marital status: Single     Spouse name: N/A   . Number of children: N/A   . Years of education: N/A     Social History Main Topics   . Smoking  status: Never Smoker   . Smokeless tobacco: Never Used   . Alcohol use Not on file   . Drug use: Unknown   . Sexual activity: Not on file     Other Topics Concern   . Not on file     Social History Narrative   . No narrative on file       Allergies:     Allergies   Allergen Reactions   . Tramadol Palpitations   . Lisinopril Swelling   . Penicillins Nausea And Vomiting   . Valium [Diazepam] Other (See Comments)     dizziness       Medications:     Prescriptions Prior to Admission   Medication Sig   . ALLOPURINOL PO Take 1 tablet by mouth daily.       [name] amLODIPine (NORVASC) 5 MG tablet Take 1 tablet (5 mg total) by mouth daily.   . ATORVASTATIN CALCIUM PO Take 1 tablet by mouth daily.   . Blood Glucose Monitoring Suppl (RELION CONFIRM GLUCOSE MONITOR) w/Device Kit  Check your blood glucose before each meal 2-3 times daily.   . chlorthalidone (HYGROTEN) 25 MG tablet Take 1 tablet (25 mg total) by mouth daily.   [name] dicyclomine (BENTYL) 10 MG capsule Take 1 capsule (10 mg total) by mouth 4 (four) times daily.   . DULoxetine (CYMBALTA) 60 MG capsule Take 60 mg by mouth 2 (two) times daily.   [name] gabapentin (NEURONTIN) 300 MG capsule Take 1 capsule (300 mg total) by mouth every 8 (eight) hours.   [name] glucose blood test strip Check your blood glucose before each meal 2-3 times daily.   . insulin lispro (HUMALOG) 100 UNIT/ML injection Inject 14 Units into the skin 3 (three) times daily before meals.   . insulin NPH-regular 70-30 MIXTURE (HUMULIN 70/30,NOVOLIN 70/30) (70-30) 100 UNIT/ML injection Inject 40 Units into the skin 2 (two) times daily before meals.   . Insulin Syringe-Needle U-100 27G X 1/2" 0.5 ML Misc 1 each by Does not apply route 5 (five) times daily.   . Lancets 30G Misc Check your blood glucose before each meal 2-3 times daily.   . metoclopramide (REGLAN) 5 MG tablet Take 1 tablet (5 mg total) by mouth 4 (four) times daily.   . metoprolol tartrate (LOPRESSOR) 25 MG tablet Take 1 tablet (25 mg total) by mouth every  12 (twelve) hours.   . pantoprazole (PROTONIX) 40 MG tablet Take 1 tablet (40 mg total) by mouth 2 (two) times daily.       Review of Systems:   Chest pain, abdominal pain, n/v, decreased appetite.  All other systems reviewed and are negative    Physical Exam:     Vitals:    02/07/17 2100   BP: 139/90   Pulse: 92   Resp: 20   Temp:    SpO2: 98%       Intake and Output Summary (Last 24 hours) at Date Time  No intake or output data in the 24 hours ending 02/07/17 2212    General appearance - alert, well appearing, and in no distress  Eyes - no discharge  Mouth - MM dry  Neck - supple, no significant adenopathy  Chest - clear to auscultation, no wheezes, rales or rhonchi, symmetric air entry  Heart - normal rate, regular rhythm, normal S1, S2, no murmurs, rubs, clicks or gallops  Abdomen - tender at epigastrium  Neurological - normal speech  Skin - warm and dry    Labs:     Results     Procedure Component Value Units Date/Time    Glucose Whole Blood - POCT [[id number]]  (Abnormal) Collected:  02/07/17 2046     Updated:  02/07/17 2058     POCT - Glucose Whole blood 207 (H) mg/dL     Glucose Whole Blood - POCT [[id number]]  (Abnormal) Collected:  02/07/17 1950     Updated:  02/07/17 1952     POCT - Glucose Whole blood 206 (H) mg/dL     Glucose Whole Blood - POCT [[id number]]  (Abnormal) Collected:  02/07/17 1907     Updated:  02/07/17 1909     POCT - Glucose Whole blood 183 (H) mg/dL     Glucose Whole Blood - POCT [[id number]]  (Abnormal) Collected:  02/07/17 1802     Updated:  02/07/17 1851     POCT - Glucose Whole blood 178 (H) mg/dL     Basic Metabolic Panel [[id number]]  (Abnormal) Collected:  02/07/17 1804    Specimen:  Blood Updated:  02/07/17  1845     Glucose 174 (H) mg/dL      BUN 16.1 mg/dL      Creatinine 1.1 (H) mg/dL      Calcium 8.7 mg/dL      Sodium 096 mEq/L      Potassium 3.1 (L) mEq/L      Chloride 100 mEq/L      CO2 22 mEq/L     GFR [[id number]] Collected:  02/07/17 1804     Updated:  02/07/17 1845     EGFR  >60.0    UA, Reflex to Microscopic (pts  3 + yrs) [[id number]]  (Abnormal) Collected:  02/07/17 1804    Specimen:  Urine Updated:  02/07/17 1837     Urine Type Clean Catch     Color, UA Straw     Clarity, UA Clear     Specific Gravity UA 1.023     Urine pH 6.0     Leukocyte Esterase, UA Negative     Nitrite, UA Negative     Protein, UR 100 (A)     Glucose, UA 150 (A)     Ketones UA 5 (A)     Urobilinogen, UA Normal mg/dL      Bilirubin, UA Negative     Blood, UA Small (A)     RBC, UA 6 - 10 (A) /hpf      WBC, UA 0 - 5 /hpf      Squamous Epithelial Cells, Urine 0 - 5 /hpf     Glucose Whole Blood - POCT [[id number]]  (Abnormal) Collected:  02/07/17 1649     Updated:  02/07/17 1654     POCT - Glucose Whole blood 214 (H) mg/dL     i-Stat CG4 Venous CartrIDge [[id number]]  (Abnormal) Collected:  02/07/17 1537     Updated:  02/07/17 1621     i-STAT pH Venous 7.601     i-STAT pCO2 Venous 24.9     i-STAT pO2 Venous 24.0     i-STAT HCO3 Bicarbonate Venous 24.6 mEq/L      i-STAT Total CO2 Venous 25.0 mEq/L      i-STAT Base Excess Venous 4.0 mEq/L      i-STAT O2 Saturation Venous 58.0 %      i-STAT Lactic acid 2.2 (H) mmol/L      i-STAT Patient Temperature 98.0     i-STAT FIO2 21     i-STAT Allen's Test NA     i-STAT Draw Site Venous    Glucose Whole Blood - POCT [[id number]]  (Abnormal) Collected:  02/07/17 1614     Updated:  02/07/17 1617     POCT - Glucose Whole blood 255 (H) mg/dL     Glucose Whole Blood - POCT [[id number]]  (Abnormal) Collected:  02/07/17 1537     Updated:  02/07/17 1548     POCT - Glucose Whole blood 314 (H) mg/dL     CBC with differential [[id number]]  (Abnormal) Collected:  02/07/17 1431    Specimen:  Blood from Blood Updated:  02/07/17 1528     WBC 10.52 x10 3/uL      Hgb 10.6 (L) g/dL      Hematocrit 63.8 (L) %      Platelets 330 x10 3/uL      RBC 3.74 (L) x10 6/uL      MCV 88.2 fL      MCH 28.3 pg      MCHC 32.1 g/dL      RDW 14 %  MPV 11.1 fL      Neutrophils 63.8 %      Lymphocytes Automated 27.8 %       Monocytes 5.5 %      Eosinophils Automated 1.2 %      Basophils Automated 0.6 %      Immature Granulocyte 1.1 %      Nucleated RBC 0.0 /100 WBC      Neutrophils Absolute 6.71 x10 3/uL      Abs Lymph Automated 2.92 x10 3/uL      Abs Mono Automated 0.58 x10 3/uL      Abs Eos Automated 0.13 x10 3/uL      Absolute Baso Automated 0.06 x10 3/uL      Absolute Immature Granulocyte 0.12 (H) x10 3/uL      Absolute NRBC 0.00 x10 3/uL     GFR [[id number]] Collected:  02/07/17 1431     Updated:  02/07/17 1525     EGFR 47.6    Comprehensive metabolic panel [[id number]]  (Abnormal) Collected:  02/07/17 1431    Specimen:  Blood Updated:  02/07/17 1525     Glucose 346 (H) mg/dL      BUN 57.8 (H) mg/dL      Creatinine 1.4 (H) mg/dL      Sodium 469 (L) mEq/L      Potassium 3.6 mEq/L      Chloride 99 (L) mEq/L      CO2 19 (L) mEq/L      Calcium 9.3 mg/dL      Protein, Total 7.0 g/dL      Albumin 3.4 (L) g/dL      AST (SGOT) 15 U/L      ALT 14 U/L      Alkaline Phosphatase 107 (H) U/L      Bilirubin, Total 0.5 mg/dL      Globulin 3.6 g/dL      Albumin/Globulin Ratio 0.9    Lipase [[id number]] Collected:  02/07/17 1431    Specimen:  Blood Updated:  02/07/17 1525     Lipase 29 U/L     Glucose Whole Blood - POCT [[id number]]  (Abnormal) Collected:  02/07/17 1437     Updated:  02/07/17 1452     POCT - Glucose Whole blood 319 (H) mg/dL           Rads:   CTA chest:    IMPRESSION:     1. Normal thoracic and abdominal CTA. No aortic aneurysm, dissection, or  pulmonary embolism.  2. Fatty liver.    Signed by: Antonieta Pert Mercy Malena

## 2017-02-07 NOTE — Progress Notes (Signed)
Patient left the unit via stretcher. Insulin drip at 5 units/hr.   Report given to [facility].

## 2017-02-07 NOTE — ED Triage Notes (Signed)
Patient brought in by EMS after patient was at work and noticed an "erruption in abdomen" around 1pm. + nausea, zofran po given. Pain radiating up into chest. 18g in right AC. DEXI 412.

## 2017-02-08 DIAGNOSIS — K760 Fatty (change of) liver, not elsewhere classified: Secondary | ICD-10-CM

## 2017-02-08 HISTORY — DX: Fatty (change of) liver, not elsewhere classified: K76.0

## 2017-02-08 LAB — GLUCOSE WHOLE BLOOD - POCT
Whole Blood Glucose POCT: 217 mg/dL — ABNORMAL HIGH (ref 70–100)
Whole Blood Glucose POCT: 235 mg/dL — ABNORMAL HIGH (ref 70–100)
Whole Blood Glucose POCT: 259 mg/dL — ABNORMAL HIGH (ref 70–100)

## 2017-02-08 LAB — BASIC METABOLIC PANEL
BUN: 13 mg/dL (ref 7.0–19.0)
CO2: 24 mEq/L (ref 22–29)
Calcium: 8.4 mg/dL — ABNORMAL LOW (ref 8.5–10.5)
Chloride: 105 mEq/L (ref 100–111)
Creatinine: 1 mg/dL (ref 0.6–1.0)
Glucose: 232 mg/dL — ABNORMAL HIGH (ref 70–100)
Potassium: 3.3 mEq/L — ABNORMAL LOW (ref 3.5–5.1)
Sodium: 138 mEq/L (ref 136–145)

## 2017-02-08 LAB — GFR: EGFR: >60

## 2017-02-08 LAB — LACTIC ACID, PLASMA: Lactic Acid: 0.8 mmol/L (ref 0.2–2.0)

## 2017-02-08 LAB — MAGNESIUM: Magnesium: 1.2 mg/dL — ABNORMAL LOW (ref 1.6–2.6)

## 2017-02-08 MED ORDER — MAGNESIUM SULFATE IN D5W 1-5 GM/100ML-% IV SOLN
1.0000 g | INTRAVENOUS | Status: DC
Start: 2017-02-08 — End: 2017-02-08
  Administered 2017-02-08: 12:00:00 1 g via INTRAVENOUS
  Filled 2017-02-08: qty 200

## 2017-02-08 MED ORDER — PANTOPRAZOLE SODIUM 40 MG PO TBEC
40.00 mg | DELAYED_RELEASE_TABLET | Freq: Every morning | ORAL | 0 refills | Status: DC
Start: 2017-02-09 — End: 2017-03-04

## 2017-02-08 MED ORDER — GABAPENTIN 300 MG PO CAPS
300.00 mg | ORAL_CAPSULE | Freq: Three times a day (TID) | ORAL | 0 refills | Status: AC
Start: 2017-02-08 — End: ?

## 2017-02-08 MED ORDER — INSULIN NPH ISOPHANE & REGULAR (70-30) 100 UNIT/ML SC SUSP
50.00 [IU] | Freq: Three times a day (TID) | SUBCUTANEOUS | Status: DC
Start: 2017-02-08 — End: 2017-02-08

## 2017-02-08 MED ORDER — POTASSIUM CHLORIDE 10 MEQ/100ML IV SOLN (WRAP)
10.0000 meq | INTRAVENOUS | Status: AC
Start: 2017-02-08 — End: 2017-02-08
  Administered 2017-02-08: 09:00:00 10 meq via INTRAVENOUS
  Filled 2017-02-08: qty 100

## 2017-02-08 MED ORDER — INSULIN NPH ISOPHANE & REGULAR (70-30) 100 UNIT/ML SC SUSP
40.00 [IU] | Freq: Two times a day (BID) | SUBCUTANEOUS | 0 refills | Status: DC
Start: 2017-02-08 — End: 2017-03-04

## 2017-02-08 MED ORDER — INSULIN NPH ISOPHANE & REGULAR (70-30) 100 UNIT/ML SC SUSP
40.00 [IU] | Freq: Two times a day (BID) | SUBCUTANEOUS | Status: DC
Start: 2017-02-08 — End: 2017-02-08
  Administered 2017-02-08: 14:00:00 40 [IU] via SUBCUTANEOUS
  Filled 2017-02-08 (×2): qty 3

## 2017-02-08 MED ORDER — METOCLOPRAMIDE HCL 5 MG PO TABS
5.00 mg | ORAL_TABLET | Freq: Four times a day (QID) | ORAL | 0 refills | Status: DC
Start: 2017-02-08 — End: 2017-03-04

## 2017-02-08 MED ORDER — MAGNESIUM OXIDE 400 MG TABS (WRAP)
800.00 mg | ORAL_TABLET | Freq: Once | ORAL | Status: AC
Start: 2017-02-08 — End: 2017-02-08
  Administered 2017-02-08: 14:00:00 800 mg via ORAL
  Filled 2017-02-08: qty 2

## 2017-02-08 MED ORDER — SUCRALFATE 1 G PO TABS
1.00 g | ORAL_TABLET | Freq: Four times a day (QID) | ORAL | 0 refills | Status: DC
Start: 2017-02-08 — End: 2017-03-04

## 2017-02-08 NOTE — UM Notes (Signed)
Admission Orders His   CollapseHide   Ordered   Start   02/07/17 2028  Place (admit) on Observation Services (ADULT OBSERVATION ADMIT PANEL) Once    Status:    Question Answer Comment   Admitting Physician GRIBETZ, JULIE DANIELE    Diagnosis DKA (diabetic ketoacidoses)    Estimated Length of Stay < 2 midnights    Tentative Discharge Plan? Home or Self Care    Patient Class Observation        02/07/17 2028       PATIENT NAME: [name] [name],[name] [name]   DOB: 1965/08/19   PMH:  has a past medical history of Diabetes mellitus; Fatty liver (02/08/2017); Hypertension; and Ovarian cyst.   PSH:  has no past surgical history on file.     Observation ADMIT ORDER DATE: 02/07/17  ADMIT DATE/TIME TO HOSPITAL:02/07/2017  2:01 PM  TYPE OF REVIEW/STATUS: Initial Observation  LOC/UNIT: [facility]  PATIENT NAME:[name] [name]  DOB: 1965/12/27  MRN: [id number]  PAYOR: Payor: CHARITY / Plan: PENDING CHARITY / Product Type: CHARITY /   ATTENDING: Birdena Crandall, MD  DX:     ICD-10-CM    1. Diabetic ketoacidosis without coma associated with type 2 diabetes mellitus E11.10    2. Non-intractable vomiting without nausea, unspecified vomiting type R11.11        PRESENTING:  Glucose 346  CO2 19  Ketones 5    Assessment:   17 yro woman:    Plan:   Ketoacidosis.  DKA vs. Starvation.  I am thinking the latter since she is a Type 2 and most likely making some amount of insulin on her own, and she has been taking her insulin at home, so unlikely that she would go into DKA.  She has been vomiting a lot this past two weeks, especially today, and some of that could be gastroparesis.  This hx would fit with starvation ketosis.    Gap was only 17 to begin with, and was 14 on recent BMP.  Insulin gtt stopped and rechecking STAT BMP.    Watching lytes, repleting KCL as needed, and will restart her on her home insulin, with SSI.    Recheck BMP in AM.    Repeat Lactate as well.    DMT2, uncontrolled, on long-term insulin, with hyperglycemia.  Home insulin, SSI.     Gastroparesis, diabetic.  Possibly a contributor to her current state.  On zofran and reglan    Hypertension, essential. Home meds, monitor.    PPX. Lovenox.  FEN. NS, KCL, DM diet  Code Status. Full    History of Present Illness:   51 y.o. female who presents to the hospital with vomiting all day, with chest and abdominal pain.  Says that those pains are better now, as is the nausea.  Has had poor appetite and n/v a lot over the last 2 weeks, and was admitted here at [facility] a couple weeks ago.  Was told that it was gastroparesis.  Today has had a lot of vomiting, and has not eaten much.  Feeling better now.    Review of Systems:   Chest pain, abdominal pain, n/v, decreased appetite    Insulin gtt stopped ~2215.    ==========================================================.  Continued Stay Review: 02/08/17        Glucose 235  Lactic acid 2.2          Current meds  Scheduled Meds:  Current Facility-Administered Medications   Medication Dose Route Frequency   . amLODIPine  5 mg Oral Daily   .  DULoxetine  60 mg Oral Q12H SCH   . enoxaparin  40 mg Subcutaneous Daily   . gabapentin  300 mg Oral Q8H SCH   . insulin NPH-regular 70-30 MIXTURE  40 Units Subcutaneous BID AC   . metoprolol tartrate  25 mg Oral Q12H SCH   . pantoprazole  40 mg Oral QAM AC   . potassium chloride  10 mEq Intravenous Q1H     Continuous Infusions:  PRN Meds:.Nursing communication: Adult Hypoglycemia Treatment Algorithm **AND** dextrose **AND** dextrose **AND** glucagon (rDNA), insulin lispro, insulin lispro, metoclopramide, morphine, naloxone, ondansetron **OR** ondansetron    Temp:  [97.8 F (36.6 C)-98.5 F (36.9 C)]   Heart Rate:  [92-117]   Resp Rate:  [15-22]   BP: (125-175)/(72-90)   SpO2:  [88 %-100 %]   Height:  [167.6 cm (5\' 6" )]   Weight:  [133 kg (293 lb 3.4 oz)-133.3 kg (293 lb 14 oz)]   BMI (calculated):  [47.5]          Patient Vitals for the past 24 hrs:   BP Temp Temp src Pulse Resp SpO2 Height Weight   02/08/17 0600 125/72 -  - (!) 102 22 95 % - -   02/08/17 0400 126/73 98.5 F (36.9 C) Oral 96 20 95 % - -   02/08/17 0200 136/77 - - (!) 105 21 97 % - -   02/08/17 0000 128/72 98.5 F (36.9 C) Oral 96 21 97 % - -   02/07/17 2300 - - - - - - 1.676 m (5\' 6" ) 133.3 kg (293 lb 14 oz)   02/07/17 2100 139/90 - - 92 20 98 % - -   02/07/17 1730 155/87 - - (!) 101 - 100 % - -   02/07/17 1622 175/86 - - 98 16 (!) 88 % - -   02/07/17 1540 131/77 - - (!) 111 - 100 % - -   02/07/17 1537 - - - - - - - 133 kg (293 lb 3.4 oz)   02/07/17 1345 141/89 97.8 F (36.6 C) - (!) 117 15 100 % - -       -Labs  Results     Procedure Component Value Units Date/Time    MRSA culture [[id number]] Collected:  02/08/17 0428    Specimen:  Body Fluid from Nares and Throat Updated:  02/08/17 0902    Glucose Whole Blood - POCT [[id number]]  (Abnormal) Collected:  02/08/17 0749     Updated:  02/08/17 0755     POCT - Glucose Whole blood 235 (H) mg/dL     Magnesium [[id number]]  (Abnormal) Collected:  02/08/17 0417    Specimen:  Blood Updated:  02/08/17 0753     Magnesium 1.2 (L) mg/dL     GFR [[id number]] Collected:  02/08/17 0418     Updated:  02/08/17 0509     EGFR >60.0    Basic Metabolic Panel [[id number]]  (Abnormal) Collected:  02/08/17 0418    Specimen:  Blood Updated:  02/08/17 0509     Glucose 232 (H) mg/dL      BUN 24.4 mg/dL      Creatinine 1.0 mg/dL      Calcium 8.4 (L) mg/dL      Sodium 010 mEq/L      Potassium 3.3 (L) mEq/L      Chloride 105 mEq/L      CO2 24 mEq/L     Glucose Whole Blood - POCT [[id number]]  (Abnormal) Collected:  02/08/17 0423  Updated:  02/08/17 0432     POCT - Glucose Whole blood 217 (H) mg/dL     Lactic acid, plasma [[id number]] Collected:  02/08/17 0038    Specimen:  Blood Updated:  02/08/17 0047     Lactic acid 0.8 mmol/L     Basic Metabolic Panel [[id number]]  (Abnormal) Collected:  02/07/17 2241    Specimen:  Blood Updated:  02/07/17 2307     Glucose 178 (H) mg/dL      BUN 62.9 mg/dL      Creatinine 1.0 mg/dL      Calcium 8.6 mg/dL       Sodium 528 mEq/L      Potassium 3.7 mEq/L      Chloride 104 mEq/L      CO2 23 mEq/L     GFR [[id number]] Collected:  02/07/17 2241     Updated:  02/07/17 2307     EGFR >60.0    Glucose Whole Blood - POCT [[id number]]  (Abnormal) Collected:  02/07/17 2046     Updated:  02/07/17 2058     POCT - Glucose Whole blood 207 (H) mg/dL     Glucose Whole Blood - POCT [[id number]]  (Abnormal) Collected:  02/07/17 1950     Updated:  02/07/17 1952     POCT - Glucose Whole blood 206 (H) mg/dL     Glucose Whole Blood - POCT [[id number]]  (Abnormal) Collected:  02/07/17 1907     Updated:  02/07/17 1909     POCT - Glucose Whole blood 183 (H) mg/dL     Glucose Whole Blood - POCT [[id number]]  (Abnormal) Collected:  02/07/17 1802     Updated:  02/07/17 1851     POCT - Glucose Whole blood 178 (H) mg/dL     Basic Metabolic Panel [[id number]]  (Abnormal) Collected:  02/07/17 1804    Specimen:  Blood Updated:  02/07/17 1845     Glucose 174 (H) mg/dL      BUN 09.3 mg/dL      Creatinine 1.1 (H) mg/dL      Calcium 8.7 mg/dL      Sodium 235 mEq/L      Potassium 3.1 (L) mEq/L      Chloride 100 mEq/L      CO2 22 mEq/L     GFR [[id number]] Collected:  02/07/17 1804     Updated:  02/07/17 1845     EGFR >60.0    UA, Reflex to Microscopic (pts  3 + yrs) [[id number]]  (Abnormal) Collected:  02/07/17 1804    Specimen:  Urine Updated:  02/07/17 1837     Urine Type Clean Catch     Color, UA Straw     Clarity, UA Clear     Specific Gravity UA 1.023     Urine pH 6.0     Leukocyte Esterase, UA Negative     Nitrite, UA Negative     Protein, UR 100 (A)     Glucose, UA 150 (A)     Ketones UA 5 (A)     Urobilinogen, UA Normal mg/dL      Bilirubin, UA Negative     Blood, UA Small (A)     RBC, UA 6 - 10 (A) /hpf      WBC, UA 0 - 5 /hpf      Squamous Epithelial Cells, Urine 0 - 5 /hpf     Glucose Whole Blood - POCT [[id number]]  (Abnormal) Collected:  02/07/17 1649  Updated:  02/07/17 1654     POCT - Glucose Whole blood 214 (H) mg/dL     i-Stat CG4 Venous CartrIDge  [[id number]]  (Abnormal) Collected:  02/07/17 1537     Updated:  02/07/17 1621     i-STAT pH Venous 7.601     i-STAT pCO2 Venous 24.9     i-STAT pO2 Venous 24.0     i-STAT HCO3 Bicarbonate Venous 24.6 mEq/L      i-STAT Total CO2 Venous 25.0 mEq/L      i-STAT Base Excess Venous 4.0 mEq/L      i-STAT O2 Saturation Venous 58.0 %      i-STAT Lactic acid 2.2 (H) mmol/L      i-STAT Patient Temperature 98.0     i-STAT FIO2 21     i-STAT Allen's Test NA     i-STAT Draw Site Venous    Glucose Whole Blood - POCT [[id number]]  (Abnormal) Collected:  02/07/17 1614     Updated:  02/07/17 1617     POCT - Glucose Whole blood 255 (H) mg/dL     Glucose Whole Blood - POCT [[id number]]  (Abnormal) Collected:  02/07/17 1537     Updated:  02/07/17 1548     POCT - Glucose Whole blood 314 (H) mg/dL     CBC with differential [[id number]]  (Abnormal) Collected:  02/07/17 1431    Specimen:  Blood from Blood Updated:  02/07/17 1528     WBC 10.52 x10 3/uL      Hgb 10.6 (L) g/dL      Hematocrit 62.9 (L) %      Platelets 330 x10 3/uL      RBC 3.74 (L) x10 6/uL      MCV 88.2 fL      MCH 28.3 pg      MCHC 32.1 g/dL      RDW 14 %      MPV 11.1 fL      Neutrophils 63.8 %      Lymphocytes Automated 27.8 %      Monocytes 5.5 %      Eosinophils Automated 1.2 %      Basophils Automated 0.6 %      Immature Granulocyte 1.1 %      Nucleated RBC 0.0 /100 WBC      Neutrophils Absolute 6.71 x10 3/uL      Abs Lymph Automated 2.92 x10 3/uL      Abs Mono Automated 0.58 x10 3/uL      Abs Eos Automated 0.13 x10 3/uL      Absolute Baso Automated 0.06 x10 3/uL      Absolute Immature Granulocyte 0.12 (H) x10 3/uL      Absolute NRBC 0.00 x10 3/uL     GFR [[id number]] Collected:  02/07/17 1431     Updated:  02/07/17 1525     EGFR 47.6    Comprehensive metabolic panel [[id number]]  (Abnormal) Collected:  02/07/17 1431    Specimen:  Blood Updated:  02/07/17 1525     Glucose 346 (H) mg/dL      BUN 64.4 (H) mg/dL      Creatinine 1.4 (H) mg/dL      Sodium 034 (L) mEq/L       Potassium 3.6 mEq/L      Chloride 99 (L) mEq/L      CO2 19 (L) mEq/L      Calcium 9.3 mg/dL      Protein, Total 7.0 g/dL  Albumin 3.4 (L) g/dL      AST (SGOT) 15 U/L      ALT 14 U/L      Alkaline Phosphatase 107 (H) U/L      Bilirubin, Total 0.5 mg/dL      Globulin 3.6 g/dL      Albumin/Globulin Ratio 0.9    Lipase [[id number]] Collected:  02/07/17 1431    Specimen:  Blood Updated:  02/07/17 1525     Lipase 29 U/L     Glucose Whole Blood - POCT [[id number]]  (Abnormal) Collected:  02/07/17 1437     Updated:  02/07/17 1452     POCT - Glucose Whole blood 319 (H) mg/dL           Xr Chest 2 Views    Result Date: 02/02/2017    Normal chest film. Annamary Carolin, MD 02/02/2017 1:51 PM    US Venous Dopp Lower Etrem Bilat    Result Date: 02/02/2017  No evidence of deep venous thrombosis in the lower extremities. Genevieve Norlander, MD 02/02/2017 4:32 PM    Ct Angio  Aaa  Chest/ Abdomen    Result Date: 02/07/2017  1. Normal thoracic and abdominal CTA. No aortic aneurysm, dissection, or pulmonary embolism. 2. Fatty liver. Nelta Numbers, MD 02/07/2017 5:49 PM    Ct Abd/ Pelvis With Iv And Po Contrast    Result Date: 02/02/2017   Indeterminate 5.1 cm left adnexal lesion, not simple cystic. Recommend follow-up pelvic ultrasound. Rocky Crafts, MD 02/02/2017 4:56 PM    US Pelvic W Transvaginal    Result Date: 02/03/2017  1. Fibroid uterus. Pedunculated left uterine fibroid corresponds to the adnexal mass seen on CT scan. 2. Nonvisualized ovaries without adnexal mass identified. Colonel Bald, MD 02/03/2017 12:48 PM          Name: Patty Sermons RN BSN,Utilization Review  [facility], Address:  [address] [place], [place]  [address]  NPI:   [id number] ID:  [id number], Work: [phone] Fax: [phone]  Please use fax number [phone] to provide authorization for  hospital services or to request additional information.

## 2017-02-08 NOTE — Discharge Instr - AVS First Page (Addendum)
Reason for your Hospital Admission:  Nausea and vomiting from slow intestines (gastroparesis) due to high blood sugars      Instructions for after your discharge:  If possible, take blood sugar reading twice a day and when not feeling well and write down the results. The first when you wake up (about 8 hrs fasting) and the second about 1-2 hrs after your largest meal. Write down the results and bring them to your follow up visit.    Follow DASH diet attached    Take medications as directed    Call for appointment tomorrow if they have not called you earlier    For the [facility] appointment please arrive 15 minutes early to the appointment and bring the following materials:  Insurance card (if insured) and photo ID  Medications in their original bottles  Glucometer/blood sugar log (if diabetic)  Weight log (if heart failure)  Proof of income (to enroll in medication assistance programs-first two pages of signed 1040 tax forms or last 2 months of pay stubs)

## 2017-02-08 NOTE — Plan of Care (Signed)
Care Plan Notes    Discharge home, K+ and Mg++ replaced      [facility] Nursing Progress Note    [name] [name] is a 51 y.o. female  Admitted 02/07/2017  2:01 PM (Hospital day 0)    Indication for continued [facility] status:  Discharge    Mews Score: 1   Actions taken for MEWS if >4: NA        Major Shift Events:  NA    Review of Systems  Neuro:  AO x4, MAE, FC     Cardiac:  Afebrile, VSS    Respiratory:  RA    GI/GU:  Consist carb diet, AUOP,     BM this shift? Yes x1    Skin Assessment  intact      LDAs  Patient Lines/Drains/Airways Status    Active Lines, Drains and Airways     None                    Indication for Central Access and estimated target removal date?  NA      Indication for Foley and estimated target removal date?  NA      Psycho/Social:  WDL     Interpreter Services:  Does the patient require an Interpreter? No    If so, what type of interpreter was used?If the family was utilized, is the interpreter waiver signed and located in the chart?

## 2017-02-08 NOTE — Progress Notes (Signed)
E11.10 (ICD-10-CM) - Diabetic ketoacidosis without coma associated with type 2 diabetes mellitus, - R11.11 (ICD-10-CM) - Non-intractable vomiting without nausea, unspecified vomiting type,      02/08/17 1144   Patient Type   Within 30 Days of Previous Admission? Yes   Healthcare Decisions   Interviewed: Patient   [place] Information: [phone]   Orientation/Decision Making Abilities of Patient Alert and Oriented x3, able to make decisions   Advance Directive Patient does not have advance directive   Advance Directive not in Chart (na)   Healthcare Agent Appointed No   Additional Emergency Contacts? brother  VAN  [phone]   Prior to admission   Prior level of function Independent with ADLs   Type of Residence Private residence   Home Layout 1/2 bath on main level;Stairs to enter with rails (add number in comment)  (12-14)   Have running water, electricity, heat, etc? Yes   Living Arrangements Family members   How do you get to your MD appointments? family    How do you get your groceries? brother    Who fixes your meals? self or brother    Who does your laundry? self   Who picks up your prescriptions? self    Dressing Independent   Grooming Independent   Feeding Independent   Bathing Independent   Toileting Independent   DME Currently at Home (denies )   Name of Prior Assisted Living Facility na   Home Care/Community Services None   Prior SNF admission? (Detail) (na)   Prior Rehab admission? (Detail) na   Adult Protective Services (APS) involved? No   Discharge Planning   Support Systems Family members   Patient expects to be discharged to: home    Anticipated [place] plan discussed with: Family   [place] discussion contact information: [phone]   Potential barriers to discharge: Testing/procedure;No RX coverage;No insurance / under insured;Other  (her learning ability and understanding )   Mode of transportation: Private car (family member)   Consults/Providers   PT Evaluation Needed 1   OT Evalulation  Needed 1   SLP Evaluation Needed 2   Outcome Palliative Care Screen Screened but did not meet criteria for intervention   Correct PCP listed in Epic? No (comment)  (see all ED around town for cares  she states )   Important Message from [facility] Notice   Patient received 1st IMM Letter? n/a   Date of most recent IMM given: (na)       Camie Patience, RN CM  EXT [phone]

## 2017-02-08 NOTE — Plan of Care (Signed)
Care Plan Notes          [facility] Nursing Progress Note    [name] [name] is a 51 y.o. female  Admitted 02/07/2017  2:01 PM [facility] day 0)    Indication for continued [facility] status:  New admit from ED, assess for downgrade?    Mews Score: 1-2  Actions taken for MEWS if >4: n/a        Major Shift Events:  Admitted from ED, report received from ED, Alberteen Spindle. initially on insulin gtt, stopped shortly after arrival to unit ~2215. Rechecked BS at 0400, 217, no insulin order at night, paged NP on call for further orders    Review of Systems  Neuro:  AAOX4, FC, MAE, hx CVA, reports right sided residual     Cardiac:  SR/ST 90-100S  SBP 130s  Afebrile  BLE NPE    Respiratory:  Room air  No desats  Clear lung sounds     GI/GU:  Cons cho and heart healthy diet  Abdominal tenderness/pain, morphine given x1   No c/o nausea   Continent   No bm     BM this shift? no    Skin Assessment  Assessed with Trinna Post, RN  Grossly intact       LDAs  Patient Lines/Drains/Airways Status    Active Lines, Drains and Airways     Name:   Placement date:   Placement time:   Site:   Days:    Peripheral IV 02/07/17 Right Antecubital  02/07/17        Antecubital    1    Peripheral IV 02/07/17 Left Hand  02/07/17    1617    Hand    less than 1                    Indication for Central Access and estimated target removal date?  n/a      Indication for Foley and estimated target removal date?  n/a      Psycho/Social:  WNL    Interpreter Services:  Does the patient require an Interpreter? no

## 2017-02-08 NOTE — Discharge Instructions (Signed)
Understanding Carbohydrates, Fats, and Protein  Food is a source of fuel and nourishment for your body. It's also a source of pleasure. Having diabetes doesn't mean you have to eat special foods or give up desserts. Instead, your dietitian can show you how to plan meals to suit your body. To start, learn how different foods affect blood sugar.  Carbohydrates  Carbohydrates are the main source of fuel for the body. Carbohydrates raise blood sugar. Many people think carbohydrates are only found in pasta or bread. But carbohydrates are actually in many kinds of foods:   Sugars occur naturally in foods such as fruit, milk, honey, and molasses. Sugars can also be added to many foods, from cereals and yogurt to candy and desserts. Sugars raise blood sugar.   Starchesare found in bread, cereals, pasta, and dried beans. They're also found in corn, peas, potatoes, yam, acorn squash, and butternut squash. Starches also raise blood sugar.   Fiberis found in foods such as vegetables, fruits, beans, and whole grains. Unlike other carbs, fiber isn't digested or absorbed. So it doesn't raise blood sugar. In fact, fiber can help keep blood sugar from rising too fast. It also helps keep blood cholesterol at a healthy level.  Did you know?  Even though carbohydrates raise blood sugar, it's best to have some in every meal. They are an important part of a healthy diet.   Fat  Fat is an energy source that can be stored until needed. Fat does not raise blood sugar. However, it can raise blood cholesterol, increasing the risk of heart disease. Fat is also high in calories, which can cause weight gain. Not all types of fat are the same.  More Healthy:   Monounsaturated fatsare mostly found in vegetable oils, such as olive, canola, and peanut oils. They are also found in avocados and some nuts. Monounsaturated fats are healthy for your heart. That's because they lower LDL (unhealthy) cholesterol.   Polyunsaturated fats are mostly  found in vegetable oils, such as corn, safflower, and soybean oils. They are also found in some seeds, nuts, and fish. Polyunsaturated fats lower LDL (unhealthy) cholesterol. So, choosing them instead of saturated fats is healthy for your heart. Certain unsaturated fats can help lower triglycerides.  Less Healthy:   Saturated fats are found in animal products, such as meat, poultry, whole milk, lard, and butter.Saturated fats raise LDL cholesterol and are not healthy for your heart.   Hydrogenated oils and trans fats are formed when vegetable oils are processed into solid fats. They are found in many processed foods. Hydrogenated oils and trans fats raise LDL cholesterol and lower HDL (healthy) cholesterol. They are not healthy for your heart.  Protein  Protein helps the body build and repair muscle and other tissue. Protein has little or no effect on blood sugar. However, many foods that contain protein also contain saturated fat. By choosing low-fat protein sources, you can get the benefits of protein without the extra fat:   Plant protein is found in dry beans and peas, nuts, and soy products, such as tofu and soymilk. These sources tend to be cholesterol-free and low in saturated fat.   Animal protein is found in fish, poultry, meat, cheese, milk, and eggs. These contain cholesterol and can be high in saturated fat. Aim for lean, lower-fat choices.  Date Last Reviewed: 05/07/2014   2000-2018 [employer] [address], [place] [address]. All rights reserved. This information is not intended as a substitute   for professional medical care. Always follow your healthcare professional's instructions.      Eating Heart-Healthy Food: Using the DASH Plan    Eating for your heart doesn't have to be hard or boring. You just need to know how to make healthier choices. The DASH eating plan has been developed to help you do just that. DASH stands for Dietary Approaches to Stop Hypertension. It is a  plan that has been proven to be healthier for your heart and to lower your risk for high blood pressure. It can also help lower your risk for cancer, heart disease, osteoporosis, and diabetes.  Choosing from each food group  Choose foods from each of the food groups below each day. Try to get the recommended number of servings for each food group. The serving numbers are based on a diet of 2,000 calories a day.Talk to your doctor if you're unsure about your calorie needs. Along with getting the correct servings, the DASH plan also recommends a sodium intake less than 2,300 mg per day.      Grains  Servings: 6 to 8 a day  A serving is:   1 slice bread   1 ounce dry cereal   Half a cup cooked rice, pasta or cereal  Best choices: Whole grains and any grains high in fiber. Vegetables  Servings: 4 to 5 a day  A serving is:   1 cup raw leafy vegetable   Half a cup cut-up raw or cooked vegetable   Half a cup vegetable juice  Best choices: Fresh or frozen vegetables prepared without added salt or fat.   Fruits  Servings: 4 to 5 a day  A serving is:   1 medium fruit   One-quarter cup dried fruit   Half a cup fresh, frozen, or canned fruit   Half a cup of 100% fruit juices  Best choices: A variety of fresh fruits of different colors. Whole fruits are abetter choice than fruit juices. Low-fat or fat-free dairy  Servings: 2 to 3 a day  A serving is:   1 cup milk   1 cup yogurt   One and a half ounces cheese  Best choices: Skim or 1% milk, low-fat or fat-free yogurt or buttermilk, and low-fat cheeses.       Lean meats, poultry, fish  Servings:6 or fewer a day  A serving is:   1ounce cooked meats, poultry, or fish   1 egg  Best choices: Leanpoultry and fish. Trim away visible fat. Broil, grill, roast, or boil instead of frying. Remove skin from poultry before eating. Limit how much red meat you eat. Nuts, seeds, beans  Servings: 4 to 5 a week  A serving is:   One-third cup nuts (one and a half  ounces)   2 tablespoons nut butter or seeds   Half a cup cooked dry beans or legumes  Best choices: Dry roasted nuts with no salt added, lentils, kidney beans, garbanzo beans, and whole pinto beans.   Fats and oils  Servings: 2 to 3 a day  A serving is:   1 teaspoon vegetable oil   1 teaspoon soft margarine   1 tablespoonmayonnaise   2 tablespoons salad dressing  Best choices: Nut and vegetable oils(nontropical vegetable oils), such as olive and canola oil. Sweets  Servings: 5 a week or fewer  A serving is:   1 tablespoon sugar, maple syrup, or honey   1 tablespoon jam or jelly   1 half-ounce jelly beans (  jelly beans (about 15)   1 cup lemonade  Best choices: Dried fruit can be a satisfying sweet. Choose low-fat sweets. And watch your serving sizes!     For more on the DASH eating plan, visit:  WedMap.it   Date Last Reviewed: 08/07/2014   2000-2018 [employer] [address], [place], [place] [address]. All rights reserved. This information is not intended as a substitute for professional medical care. Always follow your healthcare professional's instructions.

## 2017-02-08 NOTE — Progress Notes (Signed)
02/08/2017    CM update      Female  Presents with DM 11, HPT    Per Brother and sister in law  At bedside      Has multiple  Admission though out  area per family      Glucometer and box of strips given      Brother states she is not  Taking care of self    No md   He is on HD and his pcp  Nephrologist wont take anyone not insured     Advised to go with the sister to [employer] office    Medicaid   Has worked  >40 quarters   Possible medicare     Will see them in next few days      Informed them on obtaining meds at [facility]  , [facility]

## 2017-02-08 NOTE — Discharge Summary (Signed)
MEDICINE DISCHARGE SUMMARY    Date Time: 02/08/17 3:10 PM  Patient Name: [name] [name],[name] [name]  Attending Physician: Birdena Crandall, MD  Primary Care Physician: Christa See, MD    Date of Admission: 02/07/2017  Date of Discharge:  02/08/2017    Discharge Diagnoses:   Insulin dependent DMT2  Gastroparesis   HTN    Disposition:      Home with family    Recent Labs - Last 2:         Recent Labs  Lab 02/07/17  1431 02/02/17  1354   WBC 10.52 11.19*   Hgb 10.6* 10.5*   Hematocrit 33.0* 33.2*   Platelets 330 326             Recent Labs  Lab 02/07/17  1431 02/02/17  1354   Alkaline Phosphatase 107* 105   Bilirubin, Total 0.5 0.5   Protein, Total 7.0 7.3   Albumin 3.4* 3.3*   ALT 14 14   AST (SGOT) 15 17        Recent Labs  Lab 02/08/17  0418 02/08/17  0417 02/07/17  2241  02/03/17  0103   Sodium 138  --  138 More results in Results Review 133*   Potassium 3.3*  --  3.7 More results in Results Review 4.3   Chloride 105  --  104 More results in Results Review 104   CO2 24  --  23 More results in Results Review 18*   BUN 13.0  --  16.0 More results in Results Review 22.0*   Glucose 232*  --  178* More results in Results Review 511*   Calcium 8.4*  --  8.6 More results in Results Review 9.3   Magnesium  --  1.2*  --   --  1.8   More results in Results Review = values in this interval not displayed.      Recent Labs  Lab 02/03/17  1431   Cholesterol 128   Triglycerides 121   HDL 40   LDL Calculated 64           Invalid input(s): FREET4         Procedures/Radiology performed:   Radiology: all results from this admission  Xr Chest 2 Views    Result Date: 02/02/2017    Normal chest film. Annamary Carolin, MD 02/02/2017 1:51 PM    US Venous Dopp Lower Etrem Bilat    Result Date: 02/02/2017  No evidence of deep venous thrombosis in the lower extremities. Genevieve Norlander, MD 02/02/2017 4:32 PM    Ct Angio  Aaa  Chest/ Abdomen    Result Date: 02/07/2017  1. Normal thoracic and abdominal CTA. No aortic aneurysm, dissection, or  pulmonary embolism. 2. Fatty liver. Nelta Numbers, MD 02/07/2017 5:49 PM    Ct Abd/ Pelvis With Iv And Po Contrast    Result Date: 02/02/2017   Indeterminate 5.1 cm left adnexal lesion, not simple cystic. Recommend follow-up pelvic ultrasound. Rocky Crafts, MD 02/02/2017 4:56 PM    US Pelvic W Transvaginal    Result Date: 02/03/2017  1. Fibroid uterus. Pedunculated left uterine fibroid corresponds to the adnexal mass seen on CT scan. 2. Nonvisualized ovaries without adnexal mass identified. Colonel Bald, MD 02/03/2017 12:48 PM       Hospital Course:     Reason for admission/ HPI: 47F with DM, HTN and recently dx gastroparesiswho presents with all day nausea vomiting, chest and abd pain. In ED labs suggestive  of DKA but appears to be more starvation ketosis from n/v gastroparesis flare, BS 346nl lipase. AP CT fatty liver     Hospital Course: started on IVF with improved nausea and no vomiting, normalized gap and improved blood sugars on NPH 40 u BID. Able to take clear fluids and some solids without problems. Will be discharged home with TCM follow up.     Note if any home meds were changed/discontinued and why  Chlorthalidone for low or normal BP    Discharge Day Exam:  Temp:  [97.4 F (36.3 C)-98.5 F (36.9 C)] 98 F (36.7 C)  Heart Rate:  [92-111] 92  Resp Rate:  [16-22] 19  BP: (125-175)/(67-90) 128/73  NAD appears well, comfortable  HRRR without murmur, gallop, rub  Lungs CTA without wheezing, rhonchi or rales  Abdomen normoactive, soft, nontender without rebound or guarding    Consultations:     Treatment Team:   Attending Provider: Birdena Crandall, MD  Consulting Physician: Birdena Crandall, MD    Discharge Condition:     Stable    Discharge Instructions & Follow Up Plan for Patient:     Diet:  Consistent carb    Activity/Weight Bearing Status: as tolerated    Patient was instructed to follow up with:     Follow-up Information     [facility] [address]. Call in 1 day(s).     Why:  for appt if they have not called you sooner.   Contact information:  [address]  [place] [address]  [phone]               Discharge Code Status: FULL    Extended Emergency Contact Information  Primary Emergency Contact: Adela [place]  Mobile Phone: [phone]  Relation: Brother    Complete instructions and follow up are in the patient's After Visit Summary    Discharge Medications:        Discharge Medication List      Taking    ALLOPURINOL PO  Dose:  1 tablet  Take 1 tablet by mouth daily.     amLODIPine 5 MG tablet  Dose:  5 mg  Commonly known as:  NORVASC  For:  High Blood Pressure Disorder  Take 1 tablet (5 mg total) by mouth daily.     ATORVASTATIN CALCIUM PO  Dose:  1 tablet  Take 1 tablet by mouth daily.     CYMBALTA 60 MG capsule  Dose:  60 mg  Generic drug:  DULoxetine  Take 60 mg by mouth 2 (two) times daily.     dicyclomine 10 MG capsule  Dose:  10 mg  Commonly known as:  BENTYL  Take 1 capsule (10 mg total) by mouth 4 (four) times daily.     gabapentin 300 MG capsule  Dose:  300 mg  Commonly known as:  NEURONTIN  For:  Diabetes with Nerve Disease  Take 1 capsule (300 mg total) by mouth every 8 (eight) hours.     glucose blood test strip  Check your blood glucose before each meal 2-3 times daily.     insulin NPH-regular 70-30 MIXTURE (70-30) 100 UNIT/ML injection  Dose:  40 Units  What changed:   how much to take   when to take this  Commonly known as:  HumuLIN 70/30,NovoLIN 70/30  Inject 40 Units into the skin 2 (two) times daily before meals.     Insulin Syringe-Needle U-100 27G X 1/2"  0.5 ML Misc  Dose:  1 each  1 each by Does not apply route 5 (five) times daily.     Lancets 30G Misc  Check your blood glucose before each meal 2-3 times daily.     metoclopramide 5 MG tablet  Dose:  5 mg  Commonly known as:  REGLAN  For:  Diabetic Gastroparesis  Take 1 tablet (5 mg total) by mouth 4 (four) times daily.     metoprolol tartrate 25 MG tablet   Dose:  25 mg  Commonly known as:  LOPRESSOR  Take 1 tablet (25 mg total) by mouth every 12 (twelve) hours.     pantoprazole 40 MG tablet  Dose:  40 mg  What changed:  when to take this  Commonly known as:  PROTONIX  Start taking on:  02/09/2017  Take 1 tablet (40 mg total) by mouth every morning before breakfast.     RELION CONFIRM GLUCOSE MONITOR w/Device Kit  Check your blood glucose before each meal 2-3 times daily.     sucralfate 1 g tablet  Dose:  1 g  Commonly known as:  CARAFATE  Take 1 tablet (1 g total) by mouth 4 (four) times daily.        STOP taking these medications    chlorthalidone 25 MG tablet  Commonly known as:  HYGROTEN     insulin lispro 100 UNIT/ML injection  Commonly known as:  HumaLOG            [facility]  Department of Medicine  P: [phone]  F: [phone]    Signed by: Rockwell Belle[place], NP    CC: Christa See, MD

## 2017-02-15 ENCOUNTER — Ambulatory Visit (INDEPENDENT_AMBULATORY_CARE_PROVIDER_SITE_OTHER): Payer: Self-pay | Admitting: Hospitalist

## 2017-02-25 ENCOUNTER — Ambulatory Visit: Payer: Charity | Admitting: Acute Care

## 2017-03-04 ENCOUNTER — Ambulatory Visit (INDEPENDENT_AMBULATORY_CARE_PROVIDER_SITE_OTHER): Payer: Self-pay | Admitting: Nurse Practitioner

## 2017-03-04 ENCOUNTER — Ambulatory Visit (INDEPENDENT_AMBULATORY_CARE_PROVIDER_SITE_OTHER): Payer: Charity

## 2017-03-04 VITALS — BP 146/82 | HR 100 | Temp 94.4°F | Resp 16 | Wt 310.0 lb

## 2017-03-04 DIAGNOSIS — I10 Essential (primary) hypertension: Secondary | ICD-10-CM

## 2017-03-04 DIAGNOSIS — E1165 Type 2 diabetes mellitus with hyperglycemia: Secondary | ICD-10-CM

## 2017-03-04 DIAGNOSIS — D219 Benign neoplasm of connective and other soft tissue, unspecified: Secondary | ICD-10-CM

## 2017-03-04 DIAGNOSIS — E7849 Other hyperlipidemia: Secondary | ICD-10-CM

## 2017-03-04 DIAGNOSIS — E876 Hypokalemia: Secondary | ICD-10-CM

## 2017-03-04 DIAGNOSIS — K3184 Gastroparesis: Secondary | ICD-10-CM

## 2017-03-04 LAB — POCT GLUCOSE: Whole Blood Glucose POCT: 94 mg/dL (ref 70–100)

## 2017-03-04 MED ORDER — SUCRALFATE 1 G PO TABS
1.00 g | ORAL_TABLET | Freq: Four times a day (QID) | ORAL | 1 refills | Status: AC
Start: 2017-03-04 — End: ?

## 2017-03-04 MED ORDER — METOCLOPRAMIDE HCL 5 MG PO TABS
5.00 mg | ORAL_TABLET | Freq: Four times a day (QID) | ORAL | 1 refills | Status: AC
Start: 2017-03-04 — End: ?

## 2017-03-04 MED ORDER — INSULIN NPH ISOPHANE & REGULAR (70-30) 100 UNIT/ML SC SUSP
40.00 [IU] | Freq: Two times a day (BID) | SUBCUTANEOUS | 2 refills | Status: AC
Start: 2017-03-04 — End: ?

## 2017-03-04 MED ORDER — PANTOPRAZOLE SODIUM 40 MG PO TBEC
40.00 mg | DELAYED_RELEASE_TABLET | Freq: Every morning | ORAL | 2 refills | Status: AC
Start: 2017-03-04 — End: ?

## 2017-03-04 MED ORDER — DULOXETINE HCL 60 MG PO CPEP
60.00 mg | ORAL_CAPSULE | Freq: Two times a day (BID) | ORAL | 2 refills | Status: AC
Start: 2017-03-04 — End: ?

## 2017-03-04 MED ORDER — ATORVASTATIN CALCIUM 10 MG PO TABS
10.00 mg | ORAL_TABLET | Freq: Every day | ORAL | 2 refills | Status: AC
Start: 2017-03-04 — End: ?

## 2017-03-04 MED ORDER — METOPROLOL TARTRATE 25 MG PO TABS
25.00 mg | ORAL_TABLET | Freq: Two times a day (BID) | ORAL | 2 refills | Status: AC
Start: 2017-03-04 — End: ?

## 2017-03-04 MED ORDER — HYDRALAZINE HCL 25 MG PO TABS
25.00 mg | ORAL_TABLET | Freq: Two times a day (BID) | ORAL | 2 refills | Status: AC
Start: 2017-03-04 — End: ?

## 2017-03-04 MED ORDER — DICYCLOMINE HCL 10 MG PO CAPS
10.00 mg | ORAL_CAPSULE | Freq: Four times a day (QID) | ORAL | 2 refills | Status: AC
Start: 2017-03-04 — End: ?

## 2017-03-04 NOTE — Progress Notes (Signed)
Provided patient with printed AVS and reviewed.    Patient has GoodRx card and uses [employer].    Provided with information to access walking cane free of charge.

## 2017-03-04 NOTE — Patient Instructions (Signed)
Understanding Carbohydrates, Fats, and Protein  Food is a source of fuel and nourishment for your body. It's also a source of pleasure. Having diabetes doesn't mean you have to eat special foods or give up desserts. Instead, your dietitian can show you how to plan meals to suit your body. To start, learn how different foods affect blood sugar.  Carbohydrates  Carbohydrates are the main source of fuel for the body. Carbohydrates raise blood sugar. Many people think carbohydrates are only found in pasta or bread. But carbohydrates are actually in many kinds of foods:   Sugars occur naturally in foods such as fruit, milk, honey, and molasses. Sugars can also be added to many foods, from cereals and yogurt to candy and desserts. Sugars raise blood sugar.   Starchesare found in bread, cereals, pasta, and dried beans. They're also found in corn, peas, potatoes, yam, acorn squash, and butternut squash. Starches also raise blood sugar.   Fiberis found in foods such as vegetables, fruits, beans, and whole grains. Unlike other carbs, fiber isn't digested or absorbed. So it doesn't raise blood sugar. In fact, fiber can help keep blood sugar from rising too fast. It also helps keep blood cholesterol at a healthy level.  Did you know?  Even though carbohydrates raise blood sugar, it's best to have some in every meal. They are an important part of a healthy diet.   Fat  Fat is an energy source that can be stored until needed. Fat does not raise blood sugar. However, it can raise blood cholesterol, increasing the risk of heart disease. Fat is also high in calories, which can cause weight gain. Not all types of fat are the same.  More Healthy:   Monounsaturated fatsare mostly found in vegetable oils, such as olive, canola, and peanut oils. They are also found in avocados and some nuts. Monounsaturated fats are healthy for your heart. That's because they lower LDL (unhealthy) cholesterol.   Polyunsaturated fats are mostly  found in vegetable oils, such as corn, safflower, and soybean oils. They are also found in some seeds, nuts, and fish. Polyunsaturated fats lower LDL (unhealthy) cholesterol. So, choosing them instead of saturated fats is healthy for your heart. Certain unsaturated fats can help lower triglycerides.  Less Healthy:   Saturated fats are found in animal products, such as meat, poultry, whole milk, lard, and butter.Saturated fats raise LDL cholesterol and are not healthy for your heart.   Hydrogenated oils and trans fats are formed when vegetable oils are processed into solid fats. They are found in many processed foods. Hydrogenated oils and trans fats raise LDL cholesterol and lower HDL (healthy) cholesterol. They are not healthy for your heart.  Protein  Protein helps the body build and repair muscle and other tissue. Protein has little or no effect on blood sugar. However, many foods that contain protein also contain saturated fat. By choosing low-fat protein sources, you can get the benefits of protein without the extra fat:   Plant protein is found in dry beans and peas, nuts, and soy products, such as tofu and soymilk. These sources tend to be cholesterol-free and low in saturated fat.   Animal protein is found in fish, poultry, meat, cheese, milk, and eggs. These contain cholesterol and can be high in saturated fat. Aim for lean, lower-fat choices.  Date Last Reviewed: 05/07/2014   2000-2018 [employer] [address], [place] [address]. All rights reserved. This information is not intended as a substitute   for professional medical care. Always follow your healthcare professional's instructions.

## 2017-03-05 LAB — BASIC METABOLIC PANEL
BUN / Creatinine Ratio: 14 (ref 9–23)
BUN: 14 mg/dL (ref 6–24)
CO2: 26 mmol/L (ref 20–29)
Calcium: 9.7 mg/dL (ref 8.7–10.2)
Chloride: 101 mmol/L (ref 96–106)
Creatinine: 0.99 mg/dL (ref 0.57–1.00)
EGFR: 66 mL/min/{1.73_m2} (ref >59)
EGFR: 76 mL/min/{1.73_m2} (ref >59)
Glucose: 75 mg/dL (ref 65–99)
Potassium: 3.6 mmol/L (ref 3.5–5.2)
Sodium: 143 mmol/L (ref 134–144)

## 2017-03-06 NOTE — Progress Notes (Signed)
History of Present Illness:     This patient is a 51 y.o. female admitted with:  - Chest pain cleared by cardiologist   - Pneumonia Resolved after 5 day course of Levaquin  - Uncontrolled DM Type II with hyperglycemia on Insulin NPH- regular 70/30 40 units daily  Hgb A1C 8.9  - Acute renal failure - improved  - Starvation ketoacidosis improved  - Diabetic ketoacidosis  - Morbid Obesity  - Hypertension on Amlodipine 5 mg daily and Metoprolol 25 mg BID  - Hypokalemia  - Chronic pain and Neuropathy on Cymbalta 60 mg BID and Gabapentin 300 mg TID  - Hyperlipidemia on Atorvastatin 10 mg daily  - Gastroparesis on Dicyclomine 10 mg QID and Metoclopramide 5 mg four times a day and Protonix 40 mg daily and Sucralfate 1 gram four times a day EGD six years ago indicated gastric ulcers  Patient complains of right sided weakness and lack of balance and history of fibroids.   Uterine fibroids corresponds to the adnexal mass seen on the CT. She needs referral to gynecologist.    Review of Systems:     Review of Systems   Constitutional: Negative.    Respiratory: Negative.    Cardiovascular: Negative.    Gastrointestinal: Positive for nausea.   Musculoskeletal: Positive for myalgias.   Neurological: Positive for tingling, sensory change and focal weakness.       All systems reviewed and negative except as described above.    Physical Exam:     Vitals:    03/04/17 1114   BP: 146/82   Pulse: 100   Resp: 16   Temp: (!) 94.4 F (34.7 C)   SpO2: 95%       Wt Readings from Last 3 Encounters:   03/04/17 140.6 kg (310 lb)   02/07/17 133.3 kg (293 lb 14 oz)   02/02/17 133.8 kg (295 lb)       General: awake, alert, oriented x 3  HEENT: perrla, eomi, sclera anicteric,oropharynx clear without lesions, mucous membranes moist  Neck: supple, no lymphadenopathy, no thyromegaly, no JVD, no carotid bruits  Cardiovascular: S1, S2, regular rate and rhythm, no murmurs, rubs, or gallops  Lungs: clear to auscultation bilaterally, without  wheezing, rhonchi, or rales  Abdomen: soft, non tender, normal bowel sounds  Extremities: no clubbing, cyanosis, or edema  Skin: no rashes or lesions noted    Medications:     Current Outpatient Prescriptions   Medication Sig   . ALLOPURINOL PO Take 1 tablet by mouth daily.       [name] amLODIPine (NORVASC) 5 MG tablet Take 1 tablet (5 mg total) by mouth daily.   [name] atorvastatin (LIPITOR) 10 MG tablet Take 1 tablet (10 mg total) by mouth daily.   . Blood Glucose Monitoring Suppl (RELION CONFIRM GLUCOSE MONITOR) w/Device Kit Check your blood glucose before each meal 2-3 times daily.   [name] dicyclomine (BENTYL) 10 MG capsule Take 1 capsule (10 mg total) by mouth 4 (four) times daily.   . DULoxetine (CYMBALTA) 60 MG capsule Take 1 capsule (60 mg total) by mouth 2 (two) times daily.   [name] gabapentin (NEURONTIN) 300 MG capsule Take 1 capsule (300 mg total) by mouth every 8 (eight) hours.   [name] glucose blood test strip Check your blood glucose before each meal 2-3 times daily.   . hydrALAZINE (APRESOLINE) 25 MG tablet Take 1 tablet (25 mg total) by mouth 2 (two) times daily.   . insulin NPH-regular 70-30 MIXTURE (HUMULIN 70/30,NOVOLIN  70/30) (70-30) 100 UNIT/ML injection Inject 40 Units into the skin 2 (two) times daily before meals.   . Insulin Syringe-Needle U-100 27G X 1/2" 0.5 ML Misc 1 each by Does not apply route 5 (five) times daily.   . Lancets 30G Misc Check your blood glucose before each meal 2-3 times daily.   . metoclopramide (REGLAN) 5 MG tablet Take 1 tablet (5 mg total) by mouth 4 (four) times daily.   . metoprolol tartrate (LOPRESSOR) 25 MG tablet Take 1 tablet (25 mg total) by mouth every 12 (twelve) hours.   . pantoprazole (PROTONIX) 40 MG tablet Take 1 tablet (40 mg total) by mouth every morning before breakfast.   . sucralfate (CARAFATE) 1 g tablet Take 1 tablet (1 g total) by mouth 4 (four) times daily.       Diagnostics:     Lab Results   Component Value Date    WBC 10.52 02/07/2017    HGB 10.6 (L) 02/07/2017     HCT 33.0 (L) 02/07/2017    PLT 330 02/07/2017    CHOL 128 02/03/2017    TRIG 121 02/03/2017    HDL 40 02/03/2017    LDL 64 02/03/2017    ALT 14 02/07/2017    AST 15 02/07/2017    NA 143 03/04/2017    K 3.6 03/04/2017    CL 101 03/04/2017    CREAT 0.99 03/04/2017    BUN 14 03/04/2017    CO2 26 03/04/2017    GLU 75 03/04/2017    HGBA1C 8.9 (H) 02/03/2017       No results found.    Assessment:     Patient Active Problem List   Diagnosis   . Chest pain   . Periumbilical abdominal pain   . Uncontrolled type 2 diabetes mellitus with hyperglycemia   . Fibromyalgia   . History of CVA in adulthood   . Diabetic gastroparesis   . DKA (diabetic ketoacidoses)   . Starvation ketoacidosis   . Type 2 diabetes mellitus with hyperglycemia   . Essential hypertension   . Fatty liver       Plan:     Diagnoses and all orders for this visit:    Fibroids  -     Referral to Gynecology    Uncontrolled type 2 diabetes mellitus with hyperglycemia  -     POCT Glucose; Standing  -     POCT Glucose  Continue  insulin NPH-regular 70-30 MIXTURE (HUMULIN 70/30,NOVOLIN 70/30) (70-30) 100 UNIT/ML injection; Inject 40 Units into the skin 2 (two) times daily before meals.  Continue DULoxetine (CYMBALTA) 60 MG capsule; Take 1 capsule (60 mg total) by mouth 2 (two) times daily.    Gastroparesis  Continue  sucralfate (CARAFATE) 1 g tablet; Take 1 tablet (1 g total) by mouth 4 (four) times daily.  Continue  pantoprazole (PROTONIX) 40 MG tablet; Take 1 tablet (40 mg total) by mouth every morning before breakfast.  Continue  dicyclomine (BENTYL) 10 MG capsule; Take 1 capsule (10 mg total) by mouth 4 (four) times daily.  Continue   metoclopramide (REGLAN) 5 MG tablet; Take 1 tablet (5 mg total) by mouth 4 (four) times daily.    Essential hypertension  Continue   metoprolol tartrate (LOPRESSOR) 25 MG tablet; Take 1 tablet (25 mg total) by mouth every 12 (twelve) hours.  Continue  hydrALAZINE (APRESOLINE) 25 MG tablet; Take 1 tablet (25 mg total) by mouth 2  (two) times daily.    Other hyperlipidemia  Continue atorvastatin (LIPITOR) 10 MG tablet; Take 1 tablet (10 mg total) by mouth daily.    Hypokalemia  -     Basic Metabolic Panel    Patient was advised to take OTC Vitamin D, Calcium, Vitamin B12 and Co-Enzyme Q10 and have a follow up in three weeks to evaluate her improvement.    Follow up: 3 weeks  Discharged from [facility]?: No.  Medical Home Referred to/Referred Back to: Per case management     Med Rec Complete: Yes.  Referred to [facility] for diabetes A1c > 8.5: Yes patient refused  Given commitment to care documents when appropriate: Yes.     Teach back for HF, COPD, DM, AMI, PNA: Yes

## 2017-03-08 DIAGNOSIS — R079 Chest pain, unspecified: Secondary | ICD-10-CM

## 2017-03-08 HISTORY — DX: Chest pain, unspecified: R07.9

## 2017-03-19 ENCOUNTER — Inpatient Hospital Stay (HOSPITAL_COMMUNITY)
Admission: EM | Admit: 2017-03-19 | Discharge: 2017-03-22 | DRG: 552 | Disposition: A | Discharge status: Home / Self Care | Payer: Medicaid Other | Attending: Internal Medicine | Admitting: Internal Medicine

## 2017-03-19 ENCOUNTER — Emergency Department (HOSPITAL_COMMUNITY): Payer: Medicaid Other

## 2017-03-19 ENCOUNTER — Ambulatory Visit (HOSPITAL_COMMUNITY)
Admit: 2017-03-19 | Discharge: 2017-03-19 | Disposition: A | Discharge status: Home / Self Care | Payer: Medicaid Other | Attending: Emergency Medicine | Admitting: Emergency Medicine

## 2017-03-19 ENCOUNTER — Other Ambulatory Visit: Payer: Self-pay

## 2017-03-19 ENCOUNTER — Encounter (HOSPITAL_COMMUNITY): Payer: Self-pay | Admitting: *Deleted

## 2017-03-19 DIAGNOSIS — K3184 Gastroparesis: Secondary | ICD-10-CM | POA: Diagnosis present

## 2017-03-19 DIAGNOSIS — IMO0002 Reserved for concepts with insufficient information to code with codable children: Secondary | ICD-10-CM

## 2017-03-19 DIAGNOSIS — M1A9XX0 Chronic gout, unspecified, without tophus (tophi): Secondary | ICD-10-CM | POA: Diagnosis present

## 2017-03-19 DIAGNOSIS — M5117 Intervertebral disc disorders with radiculopathy, lumbosacral region: Secondary | ICD-10-CM | POA: Diagnosis present

## 2017-03-19 DIAGNOSIS — E785 Hyperlipidemia, unspecified: Secondary | ICD-10-CM | POA: Diagnosis present

## 2017-03-19 DIAGNOSIS — Z8673 Personal history of transient ischemic attack (TIA), and cerebral infarction without residual deficits: Secondary | ICD-10-CM

## 2017-03-19 DIAGNOSIS — M4807 Spinal stenosis, lumbosacral region: Secondary | ICD-10-CM | POA: Diagnosis present

## 2017-03-19 DIAGNOSIS — E1122 Type 2 diabetes mellitus with diabetic chronic kidney disease: Secondary | ICD-10-CM | POA: Diagnosis present

## 2017-03-19 DIAGNOSIS — E669 Obesity, unspecified: Secondary | ICD-10-CM | POA: Insufficient documentation

## 2017-03-19 DIAGNOSIS — K219 Gastro-esophageal reflux disease without esophagitis: Secondary | ICD-10-CM | POA: Diagnosis present

## 2017-03-19 DIAGNOSIS — E1169 Type 2 diabetes mellitus with other specified complication: Secondary | ICD-10-CM

## 2017-03-19 DIAGNOSIS — F32A Depression, unspecified: Secondary | ICD-10-CM | POA: Diagnosis present

## 2017-03-19 DIAGNOSIS — I129 Hypertensive chronic kidney disease with stage 1 through stage 4 chronic kidney disease, or unspecified chronic kidney disease: Secondary | ICD-10-CM | POA: Diagnosis present

## 2017-03-19 DIAGNOSIS — F329 Major depressive disorder, single episode, unspecified: Secondary | ICD-10-CM | POA: Diagnosis present

## 2017-03-19 DIAGNOSIS — I639 Cerebral infarction, unspecified: Secondary | ICD-10-CM | POA: Diagnosis present

## 2017-03-19 DIAGNOSIS — E1140 Type 2 diabetes mellitus with diabetic neuropathy, unspecified: Secondary | ICD-10-CM

## 2017-03-19 DIAGNOSIS — N182 Chronic kidney disease, stage 2 (mild): Secondary | ICD-10-CM | POA: Diagnosis present

## 2017-03-19 DIAGNOSIS — Z6841 Body Mass Index (BMI) 40.0 and over, adult: Secondary | ICD-10-CM

## 2017-03-19 DIAGNOSIS — Z794 Long term (current) use of insulin: Secondary | ICD-10-CM | POA: Diagnosis present

## 2017-03-19 DIAGNOSIS — M48062 Spinal stenosis, lumbar region with neurogenic claudication: Principal | ICD-10-CM | POA: Diagnosis present

## 2017-03-19 DIAGNOSIS — M5441 Lumbago with sciatica, right side: Secondary | ICD-10-CM

## 2017-03-19 DIAGNOSIS — M109 Gout, unspecified: Secondary | ICD-10-CM | POA: Diagnosis present

## 2017-03-19 DIAGNOSIS — M549 Dorsalgia, unspecified: Secondary | ICD-10-CM | POA: Diagnosis present

## 2017-03-19 DIAGNOSIS — M797 Fibromyalgia: Secondary | ICD-10-CM | POA: Diagnosis present

## 2017-03-19 DIAGNOSIS — Z833 Family history of diabetes mellitus: Secondary | ICD-10-CM

## 2017-03-19 DIAGNOSIS — E119 Type 2 diabetes mellitus without complications: Secondary | ICD-10-CM | POA: Diagnosis present

## 2017-03-19 DIAGNOSIS — M5442 Lumbago with sciatica, left side: Secondary | ICD-10-CM

## 2017-03-19 DIAGNOSIS — Z886 Allergy status to analgesic agent status: Secondary | ICD-10-CM

## 2017-03-19 DIAGNOSIS — Z8249 Family history of ischemic heart disease and other diseases of the circulatory system: Secondary | ICD-10-CM

## 2017-03-19 DIAGNOSIS — N184 Chronic kidney disease, stage 4 (severe): Secondary | ICD-10-CM | POA: Diagnosis present

## 2017-03-19 DIAGNOSIS — E876 Hypokalemia: Secondary | ICD-10-CM | POA: Diagnosis present

## 2017-03-19 DIAGNOSIS — E1165 Type 2 diabetes mellitus with hyperglycemia: Secondary | ICD-10-CM | POA: Diagnosis present

## 2017-03-19 DIAGNOSIS — I10 Essential (primary) hypertension: Secondary | ICD-10-CM | POA: Diagnosis present

## 2017-03-19 DIAGNOSIS — E1143 Type 2 diabetes mellitus with diabetic autonomic (poly)neuropathy: Secondary | ICD-10-CM | POA: Diagnosis present

## 2017-03-19 DIAGNOSIS — N1832 Chronic kidney disease, stage 3b: Secondary | ICD-10-CM | POA: Diagnosis present

## 2017-03-19 LAB — COMPREHENSIVE METABOLIC PANEL
ALK PHOS: 80 U/L (ref 38–126)
ALT: 17 U/L (ref 14–54)
ANION GAP: 9 (ref 5–15)
AST: 26 U/L (ref 15–41)
Albumin: 3.3 g/dL — ABNORMAL LOW (ref 3.5–5.0)
BILIRUBIN TOTAL: 1 mg/dL (ref 0.3–1.2)
BUN: 15 mg/dL (ref 6–20)
CALCIUM: 9.1 mg/dL (ref 8.9–10.3)
CO2: 26 mmol/L (ref 22–32)
CREATININE: 1.04 mg/dL — AB (ref 0.44–1.00)
Chloride: 103 mmol/L (ref 101–111)
Glucose, Bld: 235 mg/dL — ABNORMAL HIGH (ref 65–99)
Potassium: 3.1 mmol/L — ABNORMAL LOW (ref 3.5–5.1)
SODIUM: 138 mmol/L (ref 135–145)
Total Protein: 7 g/dL (ref 6.5–8.1)

## 2017-03-19 LAB — CBC WITH DIFFERENTIAL/PLATELET
Basophils Absolute: 0.1 10*3/uL (ref 0.0–0.1)
Basophils Relative: 1 %
EOS ABS: 0.1 10*3/uL (ref 0.0–0.7)
Eosinophils Relative: 1 %
HEMATOCRIT: 30.9 % — AB (ref 36.0–46.0)
HEMOGLOBIN: 10.1 g/dL — AB (ref 12.0–15.0)
LYMPHS ABS: 3.9 10*3/uL (ref 0.7–4.0)
LYMPHS PCT: 38 %
MCH: 29.1 pg (ref 26.0–34.0)
MCHC: 32.7 g/dL (ref 30.0–36.0)
MCV: 89 fL (ref 78.0–100.0)
MONOS PCT: 5 %
Monocytes Absolute: 0.5 10*3/uL (ref 0.1–1.0)
NEUTROS PCT: 55 %
Neutro Abs: 5.8 10*3/uL (ref 1.7–7.7)
Platelets: 388 10*3/uL (ref 150–400)
RBC: 3.47 MIL/uL — AB (ref 3.87–5.11)
RDW: 14.3 % (ref 11.5–15.5)
WBC: 10.4 10*3/uL (ref 4.0–10.5)

## 2017-03-19 LAB — URINALYSIS, ROUTINE W REFLEX MICROSCOPIC
Bilirubin Urine: NEGATIVE
GLUCOSE, UA: 50 mg/dL — AB
KETONES UR: 5 mg/dL — AB
Leukocytes, UA: NEGATIVE
NITRITE: NEGATIVE
PH: 5 (ref 5.0–8.0)
Specific Gravity, Urine: 1.022 (ref 1.005–1.030)

## 2017-03-19 LAB — BRAIN NATRIURETIC PEPTIDE: B Natriuretic Peptide: 47.7 pg/mL (ref 0.0–100.0)

## 2017-03-19 MED ORDER — INSULIN ASPART 100 UNIT/ML ~~LOC~~ SOLN
0.0000 [IU] | Freq: Every day | SUBCUTANEOUS | Status: DC
  Administered 2017-03-20: 3 [IU] via SUBCUTANEOUS
  Administered 2017-03-21: 2 [IU] via SUBCUTANEOUS

## 2017-03-19 MED ORDER — POTASSIUM CHLORIDE 20 MEQ/15ML (10%) PO SOLN
40.0000 meq | Freq: Once | ORAL | Status: DC
  Filled 2017-03-19: qty 30

## 2017-03-19 MED ORDER — HYDRALAZINE HCL 20 MG/ML IJ SOLN: 5.0000 mg | INTRAMUSCULAR | Status: DC | PRN

## 2017-03-19 MED ORDER — ONDANSETRON HCL 4 MG/2ML IJ SOLN: 4.0000 mg | Freq: Three times a day (TID) | INTRAMUSCULAR | Status: DC | PRN

## 2017-03-19 MED ORDER — ALBUTEROL SULFATE (2.5 MG/3ML) 0.083% IN NEBU: 2.5000 mg | INHALATION_SOLUTION | Freq: Four times a day (QID) | RESPIRATORY_TRACT | Status: DC | PRN

## 2017-03-19 MED ORDER — SUCRALFATE 1 G PO TABS
1.0000 g | ORAL_TABLET | Freq: Three times a day (TID) | ORAL | Status: DC
  Administered 2017-03-19 – 2017-03-22 (×11): 1 g via ORAL
  Filled 2017-03-19 (×11): qty 1

## 2017-03-19 MED ORDER — METOPROLOL SUCCINATE ER 25 MG PO TB24
25.0000 mg | ORAL_TABLET | Freq: Every day | ORAL | Status: DC
  Administered 2017-03-20 – 2017-03-21 (×2): 25 mg via ORAL
  Filled 2017-03-19 (×3): qty 1

## 2017-03-19 MED ORDER — PREDNISOLONE 5 MG PO TABS
20.0000 mg | ORAL_TABLET | Freq: Every day | ORAL | Status: DC
  Administered 2017-03-19 – 2017-03-22 (×4): 20 mg via ORAL
  Filled 2017-03-19 (×4): qty 4

## 2017-03-19 MED ORDER — OXYCODONE HCL 5 MG PO TABS
5.0000 mg | ORAL_TABLET | ORAL | Status: DC | PRN
  Administered 2017-03-20 – 2017-03-22 (×4): 5 mg via ORAL
  Filled 2017-03-19 (×5): qty 1

## 2017-03-19 MED ORDER — METOCLOPRAMIDE HCL 10 MG PO TABS
10.0000 mg | ORAL_TABLET | Freq: Three times a day (TID) | ORAL | Status: DC
  Administered 2017-03-20 – 2017-03-22 (×8): 10 mg via ORAL
  Filled 2017-03-19 (×8): qty 1

## 2017-03-19 MED ORDER — INSULIN ASPART 100 UNIT/ML ~~LOC~~ SOLN
0.0000 [IU] | Freq: Three times a day (TID) | SUBCUTANEOUS | Status: DC
  Administered 2017-03-20: 2 [IU] via SUBCUTANEOUS
  Administered 2017-03-20: 7 [IU] via SUBCUTANEOUS
  Administered 2017-03-20: 5 [IU] via SUBCUTANEOUS
  Administered 2017-03-21: 3 [IU] via SUBCUTANEOUS
  Administered 2017-03-21 – 2017-03-22 (×2): 1 [IU] via SUBCUTANEOUS

## 2017-03-19 MED ORDER — AMLODIPINE BESYLATE 10 MG PO TABS
10.0000 mg | ORAL_TABLET | Freq: Every day | ORAL | Status: DC
  Administered 2017-03-20 – 2017-03-22 (×3): 10 mg via ORAL
  Filled 2017-03-19 (×3): qty 1

## 2017-03-19 MED ORDER — METHOCARBAMOL 500 MG PO TABS
500.0000 mg | ORAL_TABLET | Freq: Three times a day (TID) | ORAL | Status: DC | PRN
  Administered 2017-03-20: 500 mg via ORAL
  Filled 2017-03-19: qty 1

## 2017-03-19 MED ORDER — HYDROMORPHONE HCL 1 MG/ML IJ SOLN
1.0000 mg | Freq: Once | INTRAMUSCULAR | Status: AC
  Administered 2017-03-19: 1 mg via INTRAVENOUS
  Filled 2017-03-19: qty 1

## 2017-03-19 MED ORDER — ATORVASTATIN CALCIUM 20 MG PO TABS
20.0000 mg | ORAL_TABLET | Freq: Every day | ORAL | Status: DC
  Administered 2017-03-20 – 2017-03-21 (×2): 20 mg via ORAL
  Filled 2017-03-19 (×2): qty 1

## 2017-03-19 MED ORDER — INSULIN ASPART PROT & ASPART (70-30 MIX) 100 UNIT/ML ~~LOC~~ SUSP
35.0000 [IU] | Freq: Every day | SUBCUTANEOUS | Status: DC
  Administered 2017-03-20 – 2017-03-21 (×2): 35 [IU] via SUBCUTANEOUS
  Filled 2017-03-19: qty 10

## 2017-03-19 MED ORDER — INSULIN ASPART PROT & ASPART (70-30 MIX) 100 UNIT/ML ~~LOC~~ SUSP
45.0000 [IU] | Freq: Every day | SUBCUTANEOUS | Status: DC
  Administered 2017-03-20 – 2017-03-22 (×2): 45 [IU] via SUBCUTANEOUS
  Filled 2017-03-19: qty 10

## 2017-03-19 MED ORDER — ONDANSETRON HCL 4 MG/2ML IJ SOLN
4.0000 mg | Freq: Once | INTRAMUSCULAR | Status: AC
  Administered 2017-03-19: 4 mg via INTRAVENOUS
  Filled 2017-03-19: qty 2

## 2017-03-19 MED ORDER — INSULIN ASPART PROT & ASPART (70-30 MIX) 100 UNIT/ML ~~LOC~~ SUSP: 35.0000 [IU] | Freq: Three times a day (TID) | SUBCUTANEOUS | Status: DC

## 2017-03-19 MED ORDER — HYDROMORPHONE HCL 1 MG/ML IJ SOLN
1.0000 mg | INTRAMUSCULAR | Status: DC | PRN
  Administered 2017-03-19 – 2017-03-22 (×9): 1 mg via INTRAVENOUS
  Filled 2017-03-19 (×9): qty 1

## 2017-03-19 MED ORDER — HYDROMORPHONE HCL 1 MG/ML IJ SOLN
1.0000 mg | Freq: Once | INTRAMUSCULAR | Status: AC
Start: 2017-03-19 — End: 2017-03-19
  Administered 2017-03-19: 1 mg via INTRAVENOUS
  Filled 2017-03-19: qty 1

## 2017-03-19 MED ORDER — PANTOPRAZOLE SODIUM 40 MG PO TBEC
40.0000 mg | DELAYED_RELEASE_TABLET | Freq: Two times a day (BID) | ORAL | Status: DC
  Administered 2017-03-19 – 2017-03-22 (×6): 40 mg via ORAL
  Filled 2017-03-19 (×6): qty 1

## 2017-03-19 MED ORDER — NITROGLYCERIN 0.4 MG SL SUBL: 0.4000 mg | SUBLINGUAL_TABLET | SUBLINGUAL | Status: DC | PRN

## 2017-03-19 MED ORDER — ENOXAPARIN SODIUM 40 MG/0.4ML ~~LOC~~ SOLN
40.0000 mg | SUBCUTANEOUS | Status: DC
  Administered 2017-03-20 – 2017-03-22 (×3): 40 mg via SUBCUTANEOUS
  Filled 2017-03-19 (×3): qty 0.4

## 2017-03-19 MED ORDER — INSULIN ASPART PROT & ASPART (70-30 MIX) 100 UNIT/ML ~~LOC~~ SUSP
60.0000 [IU] | Freq: Every day | SUBCUTANEOUS | Status: DC
  Administered 2017-03-20 – 2017-03-22 (×3): 60 [IU] via SUBCUTANEOUS
  Filled 2017-03-19 (×2): qty 10

## 2017-03-19 MED ORDER — DULOXETINE HCL 30 MG PO CPEP
30.0000 mg | ORAL_CAPSULE | Freq: Two times a day (BID) | ORAL | Status: DC
  Administered 2017-03-19 – 2017-03-22 (×6): 30 mg via ORAL
  Filled 2017-03-19 (×6): qty 1

## 2017-03-19 MED ORDER — ZOLPIDEM TARTRATE 5 MG PO TABS
5.0000 mg | ORAL_TABLET | Freq: Every evening | ORAL | Status: DC | PRN
  Administered 2017-03-20 (×2): 5 mg via ORAL
  Filled 2017-03-19 (×2): qty 1

## 2017-03-19 MED ORDER — ALLOPURINOL 100 MG PO TABS
100.0000 mg | ORAL_TABLET | Freq: Every day | ORAL | Status: DC
  Administered 2017-03-21 – 2017-03-22 (×2): 100 mg via ORAL
  Filled 2017-03-19 (×2): qty 1

## 2017-03-19 MED ORDER — POLYETHYLENE GLYCOL 3350 17 G PO PACK: 17.0000 g | PACK | Freq: Every day | ORAL | Status: DC | PRN

## 2017-03-19 NOTE — ED Notes (Signed)
[place] charge notified of Patient coming to [facility] ED after MRI. Notified that Dr. Roderic Palau discussed with Dr. Billy Fischer.

## 2017-03-19 NOTE — ED Notes (Signed)
Pt given turkey sandwich and diet ginger ale.   

## 2017-03-19 NOTE — H&P (Signed)
History and Physical    Deborah [name] NIO:[id number] DOB: 26-Dec-1965 DOA: 03/19/2017  Referring MD/NP/PA:   PCP: Patient, No Pcp Per   Patient coming from:  The patient is coming from home.  At baseline, pt is independent for most of ADL.  Chief Complaint: back pain  HPI: Deborah [name] is a 52 y.o. female with medical history significant of morbid obesity, hypertension, hyperlipidemia, diabetes mellitus, stroke, GERD, gout, depression, gastric ulcer, gastroparesis, fibromyalgia, CK-2, who presents with back pain.  Patient states that she has been having lower back pain for more than one week, which has been progressively getting worse. It is located in the lower back, constant, sharp, 9 out of 10 in severity, radiating down to both legs. Patient cannot walk normally due to severe pain. He has leg weakness, but no numbness or tingliness. No urinary incontinence or loss of control for bowel movement. No fever or chills. Patient states that she has intermittent mild SOB, but no chest pain, cough. Patient states that she had one loose stool today, and I feel nauseous, but no vomiting or diarrhea. Denies symptoms of UTI.  ED Course: pt was found to have WBC 10.4, BNP 47.7, negative urinalysis, creatinine 1.04, potassium 3.1, temperature normal, tachycardia, oxygen saturation 100% on room air, negative chest x-ray. Negative CT head for acute intracranial abnormalities. Patient is placed on MedSurg bed for observation.  MRI of the lumbar spine showed: 1. Mild lumbar spine dextrocurvature and grade 1 L4-5 anterolisthesis. 2. Mild L4-5 facet edema, likely degenerative. 3. Edema within bilateral L5 and right L4 pedicles without appreciable fracture, probably stress reaction. 4. Lumbar spondylosis greatest at L4-5 and L5-S1 levels.  5. Moderate to severe bilateral L4-5 and severe right mild left L5-S1 foraminal stenosis. 6. Moderate to severe L4-5 canal stenosis.  Review of Systems:    General: no fevers, chills, no body weight gain, has fatigue HEENT: no blurry vision, hearing changes or sore throat Respiratory: has dyspnea, no coughing, wheezing CV: no chest pain, no palpitations GI: has nausea, no vomiting, abdominal pain, diarrhea, constipation GU: no dysuria, burning on urination, increased urinary frequency, hematuria  Ext: no leg edema Neuro: no unilateral weakness, numbness, or tingling, no vision change or hearing loss Skin: no rash, no skin tear. MSK: has back pain Heme: No easy bruising.  Travel history: No recent long distant travel.  Allergy:  Allergies  Allergen Reactions  . Lisinopril Anaphylaxis    Tongue and mouth swelling  . Penicillins Palpitations    Has patient had a PCN reaction causing immediate rash, facial/tongue/throat swelling, SOB or lightheadedness with hypotension: Yes, heart races Has patient had a PCN reaction causing severe rash involving mucus membranes or skin necrosis: No Has patient had a PCN reaction that required hospitalization: Yes  Has patient had a PCN reaction occurring within the last 10 years: No   . Valium [Diazepam] [name]  . Aspirin Other (See Comments)    Irritates stomach ulcer   . Food Hives and Swelling    Carrots, ketchup   . Nsaids Other (See Comments)    ULCER  . Tylenol [Acetaminophen] Other (See Comments)    Irritates stomach ulcer   . Tramadol Nausea And Vomiting    Past Medical History:  Diagnosis Date  . Chest pain 12/2015  . Diabetes mellitus   . Fibromyalgia   . Gastric ulcer   . Gastroparesis   . Gout   . Hyperlipidemia   . Hypertension   . Obesity   .  Pneumonia   . Stroke [facility]) 02/2011    Past Surgical History:  Procedure Laterality Date  . CATARACT EXTRACTION  01/2014  . CHOLECYSTECTOMY      Social History:  reports that  has never smoked. she has never used smokeless tobacco. She reports that she does not drink alcohol or use drugs.  Family History:   Family History  Problem Relation Age of Onset  . Diabetes Mother   . Diabetes Father   . Heart disease Father   . Diabetes Sister   . Congestive Heart Failure Sister 66  . Diabetes Brother      Prior to Admission medications   Medication Sig Start Date End Date Taking? Authorizing Provider  allopurinol (ZYLOPRIM) 100 MG tablet Take 1 tablet (100 mg total) by mouth daily. 09/23/15  Yes Debbe Odea, MD  amLODipine (NORVASC) 10 MG tablet Take 1 tablet (10 mg total) by mouth daily. 06/08/16  Yes Nita Sells, MD  atorvastatin (LIPITOR) 20 MG tablet Take 20 mg by mouth at bedtime.   Yes [provider]  DULoxetine (CYMBALTA) 30 MG capsule Take 1 capsule (30 mg total) by mouth 2 (two) times daily. 09/23/15  Yes Debbe Odea, MD  methylPREDNISolone (MEDROL DOSEPAK) 4 MG TBPK tablet Take 1 tablet by mouth See admin instructions. Use as directed on package 03/16/17  Yes [provider]  metoCLOPramide (REGLAN) 10 MG tablet Take 1 tablet (10 mg total) by mouth 3 (three) times daily before meals. 08/23/16  Yes Florencia Reasons, MD  metoprolol succinate (TOPROL-XL) 25 MG 24 hr tablet Take 1 tablet (25 mg total) by mouth daily. 06/08/16  Yes Nita Sells, MD  NOVOLOG MIX 70/30 (70-30) 100 UNIT/ML injection 75 units in am, 60 units  lunch, and 50 units pm Patient taking differently: Inject 50-75 Units into the skin See admin instructions. Inject 75 unit subcutaneously with breakfast, 60 units with lunch and 50 units with supper 06/08/16  Yes Nita Sells, MD  pantoprazole (PROTONIX) 40 MG tablet Take 1 tablet (40 mg total) by mouth 2 (two) times daily. 06/08/16  Yes Nita Sells, MD  sucralfate (CARAFATE) 1 g tablet Take 1 tablet (1 g total) by mouth 4 (four) times daily -  with meals and at bedtime. 08/23/16  Yes Florencia Reasons, MD  albuterol (PROVENTIL HFA;VENTOLIN HFA) 108 (90 Base) MCG/ACT inhaler Inhale 2 puffs into the lungs every 6 (six) hours as needed for wheezing or  shortness of breath. Patient not taking: Reported on 03/19/2017 09/23/15   Debbe Odea, MD  benzonatate (TESSALON) 100 MG capsule Take 1 capsule (100 mg total) by mouth 3 (three) times daily as needed for cough. Patient not taking: Reported on 03/19/2017 08/23/16   Florencia Reasons, MD  Magnesium Oxide 400 (240 Mg) MG TABS Take 1 tablet (400 mg total) by mouth daily at 2 PM. Patient not taking: Reported on 03/19/2017 08/23/16   Florencia Reasons, MD  nitroGLYCERIN (NITROSTAT) 0.4 MG SL tablet Place 1 tablet (0.4 mg total) under the tongue every 5 (five) minutes as needed for chest pain. 12/20/15   [name], [name], MD  potassium chloride (K-DUR) 10 MEQ tablet Take 2 tablets (20 mEq total) by mouth daily. Patient not taking: Reported on 03/19/2017 08/13/16 08/20/16  [name], MD    Physical Exam: Vitals:   03/19/17 1630 03/19/17 1646 03/19/17 1700 03/19/17 1717  BP: (!) 129/97 (!) 129/97 (!) 149/98 (!) 148/92  Pulse: (!) 108 (!) 52 (!) 101 (!) 109  Resp:  20  18  Temp:      TempSrc:      SpO2:  100%  100%  Weight:      Height:       General: Not in acute distress HEENT:       Eyes: PERRL, EOMI, no scleral icterus.       ENT: No discharge from the ears and nose, no pharynx injection, no tonsillar enlargement.        Neck: No JVD, no bruit, no mass felt. Heme: No neck lymph node enlargement. Cardiac: S1/S2, RRR, No murmurs, No gallops or rubs. Respiratory:  No rales, wheezing, rhonchi or rubs. GI: Soft, nondistended, nontender, no rebound pain, no organomegaly, BS present. GU: No hematuria Ext: No pitting leg edema bilaterally. 2+DP/PT pulse bilaterally. Musculoskeletal: has tenderness in the midline of lower back Skin: No rashes.  Neuro: Alert, oriented X3, cranial nerves II-XII grossly intact, moves all extremities normally. Psych: Patient is not psychotic, no suicidal or hemocidal ideation.  Labs on Admission: I have personally reviewed following labs and imaging  studies  CBC: Recent Labs  Lab 03/19/17 1056  WBC 10.4  NEUTROABS 5.8  HGB 10.1*  HCT 30.9*  MCV 89.0  PLT 161   Basic Metabolic Panel: Recent Labs  Lab 03/19/17 1056  NA 138  K 3.1*  CL 103  CO2 26  GLUCOSE 235*  BUN 15  CREATININE 1.04*  CALCIUM 9.1   GFR: Estimated Creatinine Clearance: 91.7 mL/min (A) (by C-G formula based on SCr of 1.04 mg/dL (H)). Liver Function Tests: Recent Labs  Lab 03/19/17 1056  AST 26  ALT 17  ALKPHOS 80  BILITOT 1.0  PROT 7.0  ALBUMIN 3.3*   No results for input(s): LIPASE, AMYLASE in the last 168 hours. No results for input(s): AMMONIA in the last 168 hours. Coagulation Profile: No results for input(s): INR, PROTIME in the last 168 hours. Cardiac Enzymes: No results for input(s): CKTOTAL, CKMB, CKMBINDEX, TROPONINI in the last 168 hours. BNP (last 3 results) No results for input(s): PROBNP in the last 8760 hours. HbA1C: No results for input(s): HGBA1C in the last 72 hours. CBG: No results for input(s): GLUCAP in the last 168 hours. Lipid Profile: No results for input(s): CHOL, HDL, LDLCALC, TRIG, CHOLHDL, LDLDIRECT in the last 72 hours. Thyroid Function Tests: No results for input(s): TSH, T4TOTAL, FREET4, T3FREE, THYROIDAB in the last 72 hours. Anemia Panel: No results for input(s): VITAMINB12, FOLATE, FERRITIN, TIBC, IRON, RETICCTPCT in the last 72 hours. Urine analysis:    Component Value Date/Time   COLORURINE YELLOW 03/19/2017 1510   APPEARANCEUR CLEAR 03/19/2017 1510   LABSPEC 1.022 03/19/2017 1510   PHURINE 5.0 03/19/2017 1510   GLUCOSEU 50 (A) 03/19/2017 1510   HGBUR MODERATE (A) 03/19/2017 1510   BILIRUBINUR NEGATIVE 03/19/2017 1510   KETONESUR 5 (A) 03/19/2017 1510   PROTEINUR >=300 (A) 03/19/2017 1510   UROBILINOGEN 0.2 10/02/2013 2108   NITRITE NEGATIVE 03/19/2017 1510   LEUKOCYTESUR NEGATIVE 03/19/2017 1510   Sepsis Labs: @LABRCNTIP (procalcitonin:4,lacticidven:4) )No results found for this or any  previous visit (from the past 240 hour(s)).   Radiological Exams on Admission: Dg Chest 2 View  Result Date: 03/19/2017 CLINICAL DATA:  Bilateral leg pain for 1 week. EXAM: CHEST  2 VIEW COMPARISON:  August 22, 2016 FINDINGS: The heart size and mediastinal contours are within normal limits. Both lungs are clear. The visualized skeletal structures are unremarkable. IMPRESSION: No active cardiopulmonary disease. Electronically Signed   By: Dorise Bullion III M.D  On: 03/19/2017 10:57   Dg Lumbar Spine Complete  Result Date: 03/19/2017 CLINICAL DATA:  Bilateral leg pain for 1 week. EXAM: LUMBAR SPINE - COMPLETE 4+ VIEW COMPARISON:  None. FINDINGS: Grade 1 anterolisthesis of L4 versus L5. No other malalignment. No fractures identified. Mild lower lumbar facet degenerative changes. No other acute abnormalities. IMPRESSION: Grade 1 anterolisthesis of L4 versus L5. Mild lower lumbar facet degenerative changes. Electronically Signed   By: Dorise Bullion III M.D   On: 03/19/2017 10:56   Ct Head Wo Contrast  Result Date: 03/19/2017 CLINICAL DATA:  Lower extremity weakness. EXAM: CT HEAD WITHOUT CONTRAST TECHNIQUE: Contiguous axial images were obtained from the base of the skull through the vertex without intravenous contrast. COMPARISON:  None. FINDINGS: Brain: No acute intracranial abnormality. Specifically, no hemorrhage, hydrocephalus, mass lesion, acute infarction, or significant intracranial injury. Vascular: No hyperdense vessel or unexpected calcification. Skull: No acute calvarial abnormality. Sinuses/Orbits: Visualized paranasal sinuses and mastoids clear. Orbital soft tissues unremarkable. Other: None IMPRESSION: No intracranial abnormality. Electronically Signed   By: Rolm Baptise M.D.   On: 03/19/2017 15:52   Mr Lumbar Spine Wo Contrast  Result Date: 03/19/2017 CLINICAL DATA:  52 y/o F; lower back pain and weakness down both legs for 1 week. EXAM: MRI LUMBAR SPINE WITHOUT CONTRAST TECHNIQUE:  Multiplanar, multisequence MR imaging of the lumbar spine was performed. No intravenous contrast was administered. COMPARISON:  07/04/2016 CT abdomen and pelvis FINDINGS: Segmentation:  Standard. Alignment: Mild dextrocurvature with apex at L3. Grade 1 L4-5 anterolisthesis. Vertebrae: Mild edema surrounding the L4-5 facets and trace facet effusions, likely degenerative. Edema within the bilateral L5 and right L4 pedicles without appreciable fracture, probable stress reaction. No evidence for discitis. No loss of vertebral body height. Conus medullaris and cauda equina: Conus extends to the L1 level. Conus and cauda equina appear normal. Paraspinal and other soft tissues: Negative. Disc levels: L1-2: No significant disc displacement, foraminal stenosis, or canal stenosis. L2-3: No significant disc displacement, foraminal stenosis, or canal stenosis. L3-4: Minimal disc bulge and mild facet hypertrophy. No significant foraminal or canal stenosis. L4-5: 1 anterolisthesis with small uncovered disc bulge and severe facet hypertrophy. Moderate to severe bilateral foraminal stenosis. Moderate to severe canal stenosis. Lateral recess effacement. L5-S1: Small disc bulge, endplate marginal osteophytes, small central disc protrusion, and mild facet hypertrophy. Severe right foraminal and mild left foraminal stenosis. No significant canal stenosis. IMPRESSION: 1. Mild lumbar spine dextrocurvature and grade 1 L4-5 anterolisthesis. 2. Mild L4-5 facet edema, likely degenerative. 3. Edema within bilateral L5 and right L4 pedicles without appreciable fracture, probably stress reaction. 4. Lumbar spondylosis greatest at L4-5 and L5-S1 levels. 5. Moderate to severe bilateral L4-5 and severe right mild left L5-S1 foraminal stenosis. 6. Moderate to severe L4-5 canal stenosis. Electronically Signed   By: Kristine Garbe M.D.   On: 03/19/2017 19:48     EKG: Independently reviewed.    Assessment/Plan Principal Problem:    Back pain Active Problems:   Essential hypertension, benign   Uncontrolled type 2 diabetes mellitus with diabetic neuropathy, with long-term current use of insulin (HCC)   Dyslipidemia associated with type 2 diabetes mellitus (HCC)   CKD (chronic kidney disease), stage II   Hypokalemia   Gout   Gastroparesis   Stroke (cerebrum) (HCC)   GERD (gastroesophageal reflux disease)   Depression   Back pain: this due to degenerative disc disease and spinal stenosis as evidenced by MRI. No spinal cord compression on MRI. -will place on med-surg bed for  obs -prn oxycodone, dilaudid and robaxin  -prednisone 20 mg daily -pt/ot  Essential hypertension, benign: -Continue home medications: Amlodipine, metoprolol, -IV hydralazine prn  Uncontrolled type 2 diabetes mellitus with diabetic neuropathy, with long-term current use of insulin ([place]): Last A1c 9.7 on 07/05/16, poorly controled. Patient is taking at home -will decrease dose of 70/30 insulin dose from 75/60/35 to 60/45/35 Unit tid -SSI  Dyslipidemia associated with type 2 diabetes mellitus ([place]): -Lipitor  CKD (chronic kidney disease), stage II: stable. Creatinine 1.04 -Follow-up renal function. BMP  Hypokalemia: K=3.0 on admission. - Repleted - Check Mg level  Gout: -continue home allopurinol   Gastroparesis: -prn zofran -continue home reglan  Hx of Stroke (cerebrum) ([place]): -allergic to asa -on lipitor  GERD: -Protonix  Depression: Stable, no suicidal or homicidal ideations. -Continue home medications: Cymbalta  SOB: Patient has intermittent mild shortness of breath. Currently no chest pain. No oxygen saturation. Chest x-rays negative. Endocrine etiology. Low suspicion for PE. -When necessary orbital nebulizers -if getting worse, develop CP or oxygen desaturation, will do workup to rule out PE   DVT ppx: SQ Lovenox Code Status: Full code Family Communication: None at bed side.    Disposition Plan:  Anticipate  discharge back to previous home environment Consults called:  none Admission status:   medical floor/med  Date of Service 03/19/2017    Ivor Costa [facility] Pager [phone]  If 7PM-7AM, please contact night-coverage [email or web address] Password [facility] 03/19/2017, 10:19 PM

## 2017-03-19 NOTE — ED Notes (Signed)
Pt states sudden onset of being unable to walk. Cane at bedside. Pt requesting something to eat/drink because "I am diabetic". Pt instructed she would need to wait until after the MRI results come back. This RN offered to check pt CBG and pt refused.

## 2017-03-19 NOTE — ED Notes (Signed)
Bed: NI77 Expected date:  Expected time:  Means of arrival:  Comments: Hold for [place] [facility]

## 2017-03-19 NOTE — ED Triage Notes (Addendum)
EMS reports pt has bil leg swelling for > 1 week, unable to walk, also reports pain in legs and dyspnea on exertion. CBG 284 [id number]

## 2017-03-19 NOTE — ED Notes (Signed)
Pt upset about being in the hallway. Pt requesting to talk to the Charge RN. Charge RN aware.

## 2017-03-19 NOTE — ED Provider Notes (Addendum)
Deborah [name] Provider Note   [name]: [id number] Arrival date & time: 03/19/17  0935     History   Chief Complaint Chief Complaint  Patient presents with  . Leg Swelling    HPI Deborah [name] is a 52 y.o. female.  Patient complains of weakness in her legs for a couple days now also pain in her lower back running down both her legs   The history is provided by the patient. No language interpreter was used.  Illness  This is a new problem. The current episode started 2 days ago. The problem occurs constantly. The problem has not changed since onset.Pertinent negatives include no chest pain, no abdominal pain and no headaches. Nothing aggravates the symptoms. Nothing relieves the symptoms. She has tried nothing for the symptoms. The treatment provided no relief.    Past Medical History:  Diagnosis Date  . Chest pain 12/2015  . Diabetes mellitus   . Fibromyalgia   . Gastric ulcer   . Gastroparesis   . Gout   . Hyperlipidemia   . Hypertension   . Obesity   . Pneumonia   . Stroke [name]) 02/2011    Patient Active Problem List   Diagnosis Date Noted  . Urinary tract infection 08/16/2016  . Anemia 08/16/2016  . Gastroparesis 08/16/2016  . Intractable nausea and vomiting 06/17/2016  . Diabetic gastroparesis ([place]) 06/05/2016  . Gout 06/05/2016  . AKI (acute kidney injury) ([name]) 12/06/2015  . Hypokalemia 09/26/2015  . Hypomagnesemia 09/26/2015  . Nausea and vomiting 08/20/2015  . Gout flare 05/27/2015  . Abdominal pain 05/26/2015  . DKA (diabetic ketoacidoses) ([name]) 05/25/2015  . Uncontrolled type 2 diabetes mellitus with diabetic neuropathy, with long-term current use of insulin ([name]) 05/25/2015  . Dyslipidemia associated with type 2 diabetes mellitus ([name]) 05/25/2015  . CKD (chronic kidney disease), stage II 05/25/2015  . Essential hypertension, benign 09/28/2013    Past Surgical History:  Procedure Laterality Date  . CATARACT  EXTRACTION  01/2014  . CHOLECYSTECTOMY      OB History    No data available       Home Medications    Prior to Admission medications   Medication Sig Start Date End Date Taking? Authorizing Provider  allopurinol (ZYLOPRIM) 100 MG tablet Take 1 tablet (100 mg total) by mouth daily. 09/23/15  Yes Debbe Odea, MD  amLODipine (NORVASC) 10 MG tablet Take 1 tablet (10 mg total) by mouth daily. 06/08/16  Yes Nita Sells, MD  atorvastatin (LIPITOR) 20 MG tablet Take 20 mg by mouth at bedtime.   Yes [provider]  DULoxetine (CYMBALTA) 30 MG capsule Take 1 capsule (30 mg total) by mouth 2 (two) times daily. 09/23/15  Yes Debbe Odea, MD  methylPREDNISolone (MEDROL DOSEPAK) 4 MG TBPK tablet Take 1 tablet by mouth See admin instructions. Use as directed on package 03/16/17  Yes [provider]  metoCLOPramide (REGLAN) 10 MG tablet Take 1 tablet (10 mg total) by mouth 3 (three) times daily before meals. 08/23/16  Yes Florencia Reasons, MD  metoprolol succinate (TOPROL-XL) 25 MG 24 hr tablet Take 1 tablet (25 mg total) by mouth daily. 06/08/16  Yes Nita Sells, MD  NOVOLOG MIX 70/30 (70-30) 100 UNIT/ML injection 75 units in am, 60 units  lunch, and 50 units pm Patient taking differently: Inject 50-75 Units into the skin See admin instructions. Inject 75 unit subcutaneously with breakfast, 60 units with lunch and 50 units with supper 06/08/16  Yes [name], [name],  MD  pantoprazole (PROTONIX) 40 MG tablet Take 1 tablet (40 mg total) by mouth 2 (two) times daily. 06/08/16  Yes Nita Sells, MD  sucralfate (CARAFATE) 1 g tablet Take 1 tablet (1 g total) by mouth 4 (four) times daily -  with meals and at bedtime. 08/23/16  Yes Florencia Reasons, MD  albuterol (PROVENTIL HFA;VENTOLIN HFA) 108 (90 Base) MCG/ACT inhaler Inhale 2 puffs into the lungs every 6 (six) hours as needed for wheezing or shortness of breath. Patient not taking: Reported on 03/19/2017 09/23/15   Debbe Odea, MD    benzonatate (TESSALON) 100 MG capsule Take 1 capsule (100 mg total) by mouth 3 (three) times daily as needed for cough. Patient not taking: Reported on 03/19/2017 08/23/16   Florencia Reasons, MD  Magnesium Oxide 400 (240 Mg) MG TABS Take 1 tablet (400 mg total) by mouth daily at 2 PM. Patient not taking: Reported on 03/19/2017 08/23/16   Florencia Reasons, MD  nitroGLYCERIN (NITROSTAT) 0.4 MG SL tablet Place 1 tablet (0.4 mg total) under the tongue every 5 (five) minutes as needed for chest pain. 12/20/15   [name], [name], MD  potassium chloride (K-DUR) 10 MEQ tablet Take 2 tablets (20 mEq total) by mouth daily. Patient not taking: Reported on 03/19/2017 08/13/16 08/20/16  [name], MD    Family History Family History  Problem Relation Age of Onset  . Diabetes Mother   . Diabetes Father   . Heart disease Father   . Diabetes Sister   . Congestive Heart Failure Sister 23  . Diabetes Brother     Social History Social History   Tobacco Use  . Smoking status: Never Smoker  . Smokeless tobacco: Never Used  Substance Use Topics  . Alcohol use: No  . Drug use: No     Allergies   Lisinopril; Penicillins; Valium [diazepam]; Aspirin; Food; Nsaids; Tylenol [acetaminophen]; and Tramadol   Review of Systems Review of Systems  Constitutional: Negative for appetite change and fatigue.  HENT: Negative for congestion, ear discharge and sinus pressure.   Eyes: Negative for discharge.  Respiratory: Negative for cough.   Cardiovascular: Negative for chest pain.  Gastrointestinal: Negative for abdominal pain and diarrhea.  Genitourinary: Negative for frequency and hematuria.  Musculoskeletal: Negative for back pain.       Pain in the lower back with weakness in both legs  Skin: Negative for rash.  Neurological: Negative for seizures and headaches.  Psychiatric/Behavioral: Negative for hallucinations.     Physical Exam Updated Vital Signs BP (!) 152/105 (BP Location: Right Arm)    Pulse (!) 104   Temp 97.7 F (36.5 C) (Oral)   Resp 20   Ht 5\' 6"  (1.676 m)   Wt (!) 140.6 kg (310 lb)   LMP 10/10/2012   SpO2 100%   BMI 50.04 kg/m   Physical Exam  Constitutional: She is oriented to person, place, and time. She appears well-developed.  HENT:  Head: Normocephalic.  Eyes: Conjunctivae and EOM are normal. No scleral icterus.  Neck: Neck supple. No thyromegaly present.  Cardiovascular: Normal rate and regular rhythm. Exam reveals no gallop and no friction rub.  No murmur heard. Pulmonary/Chest: No stridor. She has no wheezes. She has no rales. She exhibits no tenderness.  Abdominal: She exhibits no distension. There is no tenderness. There is no rebound.  Genitourinary:  Genitourinary Comments: Normal rectal tone  Musculoskeletal: Normal range of motion. She exhibits no edema.  Tenderness across lumbar spine  Lymphadenopathy:  She has no cervical adenopathy.  Neurological: She is oriented to person, place, and time. She exhibits normal muscle tone. Coordination normal.  Patient having moderate weakness in both her legs.  She is able to lift them off the bed slowly but not hold them up.  She also has weakness with abducting both her legs.  Patient can barely stand but cannot walk  Skin: No rash noted. No erythema.  Psychiatric: She has a normal mood and affect. Her behavior is normal.     ED Treatments / Results  Labs (all labs ordered are listed, but only abnormal results are displayed) Labs Reviewed  CBC WITH DIFFERENTIAL/PLATELET - Abnormal; Notable for the following components:      Result Value   RBC 3.47 (*)    Hemoglobin 10.1 (*)    HCT 30.9 (*)    All other components within normal limits  COMPREHENSIVE METABOLIC PANEL - Abnormal; Notable for the following components:   Potassium 3.1 (*)    Glucose, Bld 235 (*)    Creatinine, Ser 1.04 (*)    Albumin 3.3 (*)    All other components within normal limits  BRAIN NATRIURETIC PEPTIDE  URINALYSIS,  ROUTINE W REFLEX MICROSCOPIC    EKG  EKG Interpretation None       Radiology Dg Chest 2 View  Result Date: 03/19/2017 CLINICAL DATA:  Bilateral leg pain for 1 week. EXAM: CHEST  2 VIEW COMPARISON:  August 22, 2016 FINDINGS: The heart size and mediastinal contours are within normal limits. Both lungs are clear. The visualized skeletal structures are unremarkable. IMPRESSION: No active cardiopulmonary disease. Electronically Signed   By: Dorise Bullion III M.D   On: 03/19/2017 10:57   Dg Lumbar Spine Complete  Result Date: 03/19/2017 CLINICAL DATA:  Bilateral leg pain for 1 week. EXAM: LUMBAR SPINE - COMPLETE 4+ VIEW COMPARISON:  None. FINDINGS: Grade 1 anterolisthesis of L4 versus L5. No other malalignment. No fractures identified. Mild lower lumbar facet degenerative changes. No other acute abnormalities. IMPRESSION: Grade 1 anterolisthesis of L4 versus L5. Mild lower lumbar facet degenerative changes. Electronically Signed   By: Dorise Bullion III M.D   On: 03/19/2017 10:56    Procedures Procedures (including critical care time)  Medications Ordered in ED Medications  HYDROmorphone (DILAUDID) injection 1 mg (1 mg Intravenous Given 03/19/17 1318)  ondansetron (ZOFRAN) injection 4 mg (4 mg Intravenous Given 03/19/17 1316)     Initial Impression / Assessment and Plan / ED Course  I have reviewed the triage vital signs and the nursing notes.  Pertinent labs & imaging results that were available during my care of the patient were reviewed by me and considered in my medical decision making (see chart for details).   Patient with profound weakness in her lower extremities.  She is to get an MRI of her lumbar spine.  MRI must be done at [facility] because the patient is a large  Patient will be admitted over at [facility] after she has the MRI of her lumbar spine.  I spoke with Dr. Billy Fischer at [facility] Final Clinical Impressions(s) / ED Diagnoses   Final  diagnoses:  None    ED Discharge Orders    None       Milton Ferguson, MD 03/19/17 1449    Milton Ferguson, MD 03/19/17 1612    Milton Ferguson, MD 03/19/17 1617

## 2017-03-19 NOTE — ED Notes (Signed)
EDP aware that the patient will need to go to [facility] for MRI due to weight and size. ED secretary, Lattie Haw to call MRI at [facility].

## 2017-03-19 NOTE — ED Provider Notes (Signed)
  Physical Exam  BP (!) 148/92 (BP Location: Left Arm)   Pulse (!) 109   Temp 97.7 F (36.5 C) (Oral)   Resp 18   Ht 5\' 6"  (1.676 m)   Wt (!) 140.6 kg (310 lb)   LMP 10/10/2012   SpO2 100%   BMI 50.04 kg/m   Physical Exam  ED Course/Procedures     Procedures  MDM  Received patient in transfer from [facility].  MRI ordered due to back pain and weakness.  Patient states she cannot walk.  Severe pain.  Has been seen at outside hospital and started on steroids for this.  MRI has some findings were overall reassuring.  Will admit to hospital for pain control and further evaluation.       Davonna Belling, MD 03/19/17 2005

## 2017-03-19 NOTE — ED Notes (Signed)
Patient transported to CT 

## 2017-03-19 NOTE — ED Notes (Signed)
Unable to complete In and out cath due to anatomy of the patient. Patient was able to void/clean catch.

## 2017-03-19 NOTE — ED Notes (Signed)
Bed: [facility]ALB Expected date:  Expected time:  Means of arrival:  Comments: 

## 2017-03-20 ENCOUNTER — Other Ambulatory Visit: Payer: Self-pay

## 2017-03-20 DIAGNOSIS — F329 Major depressive disorder, single episode, unspecified: Secondary | ICD-10-CM | POA: Diagnosis present

## 2017-03-20 DIAGNOSIS — E876 Hypokalemia: Secondary | ICD-10-CM | POA: Diagnosis present

## 2017-03-20 DIAGNOSIS — K3184 Gastroparesis: Secondary | ICD-10-CM | POA: Diagnosis present

## 2017-03-20 DIAGNOSIS — M797 Fibromyalgia: Secondary | ICD-10-CM | POA: Diagnosis present

## 2017-03-20 DIAGNOSIS — I129 Hypertensive chronic kidney disease with stage 1 through stage 4 chronic kidney disease, or unspecified chronic kidney disease: Secondary | ICD-10-CM | POA: Diagnosis present

## 2017-03-20 DIAGNOSIS — M5442 Lumbago with sciatica, left side: Secondary | ICD-10-CM | POA: Diagnosis present

## 2017-03-20 DIAGNOSIS — M4807 Spinal stenosis, lumbosacral region: Secondary | ICD-10-CM | POA: Diagnosis present

## 2017-03-20 DIAGNOSIS — Z6841 Body Mass Index (BMI) 40.0 and over, adult: Secondary | ICD-10-CM | POA: Diagnosis not present

## 2017-03-20 DIAGNOSIS — E785 Hyperlipidemia, unspecified: Secondary | ICD-10-CM | POA: Diagnosis present

## 2017-03-20 DIAGNOSIS — Z886 Allergy status to analgesic agent status: Secondary | ICD-10-CM | POA: Diagnosis not present

## 2017-03-20 DIAGNOSIS — E1143 Type 2 diabetes mellitus with diabetic autonomic (poly)neuropathy: Secondary | ICD-10-CM | POA: Diagnosis present

## 2017-03-20 DIAGNOSIS — N182 Chronic kidney disease, stage 2 (mild): Secondary | ICD-10-CM | POA: Diagnosis present

## 2017-03-20 DIAGNOSIS — M5117 Intervertebral disc disorders with radiculopathy, lumbosacral region: Secondary | ICD-10-CM | POA: Diagnosis present

## 2017-03-20 DIAGNOSIS — Z8249 Family history of ischemic heart disease and other diseases of the circulatory system: Secondary | ICD-10-CM | POA: Diagnosis not present

## 2017-03-20 DIAGNOSIS — Z8673 Personal history of transient ischemic attack (TIA), and cerebral infarction without residual deficits: Secondary | ICD-10-CM | POA: Diagnosis not present

## 2017-03-20 DIAGNOSIS — K219 Gastro-esophageal reflux disease without esophagitis: Secondary | ICD-10-CM | POA: Diagnosis present

## 2017-03-20 DIAGNOSIS — Z794 Long term (current) use of insulin: Secondary | ICD-10-CM | POA: Diagnosis not present

## 2017-03-20 DIAGNOSIS — Z833 Family history of diabetes mellitus: Secondary | ICD-10-CM | POA: Diagnosis not present

## 2017-03-20 DIAGNOSIS — M48062 Spinal stenosis, lumbar region with neurogenic claudication: Secondary | ICD-10-CM | POA: Diagnosis present

## 2017-03-20 DIAGNOSIS — E1122 Type 2 diabetes mellitus with diabetic chronic kidney disease: Secondary | ICD-10-CM | POA: Diagnosis present

## 2017-03-20 DIAGNOSIS — M109 Gout, unspecified: Secondary | ICD-10-CM | POA: Diagnosis present

## 2017-03-20 LAB — GLUCOSE, CAPILLARY
GLUCOSE-CAPILLARY: 337 mg/dL — AB (ref 65–99)
GLUCOSE-CAPILLARY: 284 mg/dL — AB (ref 65–99)
GLUCOSE-CAPILLARY: 187 mg/dL — AB (ref 65–99)
Glucose-Capillary: 156 mg/dL — ABNORMAL HIGH (ref 65–99)

## 2017-03-20 LAB — CBC
HEMATOCRIT: 31 % — AB (ref 36.0–46.0)
Hemoglobin: 9.6 g/dL — ABNORMAL LOW (ref 12.0–15.0)
MCH: 27.7 pg (ref 26.0–34.0)
MCHC: 31 g/dL (ref 30.0–36.0)
MCV: 89.6 fL (ref 78.0–100.0)
PLATELETS: 387 10*3/uL (ref 150–400)
RBC: 3.46 MIL/uL — ABNORMAL LOW (ref 3.87–5.11)
RDW: 14.2 % (ref 11.5–15.5)
WBC: 8.7 10*3/uL (ref 4.0–10.5)

## 2017-03-20 LAB — BASIC METABOLIC PANEL
ANION GAP: 10 (ref 5–15)
BUN: 15 mg/dL (ref 6–20)
CHLORIDE: 102 mmol/L (ref 101–111)
CO2: 26 mmol/L (ref 22–32)
Calcium: 8.9 mg/dL (ref 8.9–10.3)
Creatinine, Ser: 1.03 mg/dL — ABNORMAL HIGH (ref 0.44–1.00)
GFR calc non Af Amer: >60 mL/min (ref >60)
GLUCOSE: 350 mg/dL — AB (ref 65–99)
POTASSIUM: 3.8 mmol/L (ref 3.5–5.1)
Sodium: 138 mmol/L (ref 135–145)

## 2017-03-20 LAB — MAGNESIUM: Magnesium: 1.5 mg/dL — ABNORMAL LOW (ref 1.7–2.4)

## 2017-03-20 MED ORDER — MAGNESIUM SULFATE 2 GM/50ML IV SOLN
2.0000 g | Freq: Once | INTRAVENOUS | Status: AC
Start: 2017-03-20 — End: 2017-03-20
  Administered 2017-03-20: 2 g via INTRAVENOUS
  Filled 2017-03-20: qty 50

## 2017-03-20 NOTE — Progress Notes (Signed)
Physical Therapy Treatment Patient Details Name: Deborah [name] MRN: [id number] DOB: [birth year] Today's Date: 03/20/2017    History of Present Illness Deborah [name] is a 52 y.o. female with medical history significant of morbid obesity, hypertension, hyperlipidemia, diabetes mellitus, stroke, GERD, gout, depression, gastric ulcer, gastroparesis, fibromyalgia, CK-2, who presents with back pain. Patient states that she has been having lower back pain for more than one week, which has been progressively getting worse. It is located in the lower back, constant, sharp, 9 out of 10 in severity, radiating down to both legs. Patient cannot walk normally due to severe pain. this due to degenerative disc disease and spinal stenosis as evidenced by MRI    PT Comments    Continuing work on functional mobility and activity tolerance;  Took time this session to problem-solve through ideas to get her tub bench (here, in [place]) to her home in [place], [place]; Encouraged her to check and see if family or friends can drive it (and maybe her as well) up to her home; she tells [place] she will talk to her brother   Follow Up Recommendations  Outpatient PT;Supervision - Intermittent     Equipment Recommendations  Rolling walker with 5" wheels;3in1 (PT);Other (comment)(Wide, to accomodate 310 lbs)    Recommendations for Other Services       Precautions / Restrictions Precautions Precautions: Back(for comfort) Precaution Booklet Issued: No Precaution Comments: Educated in Back Precautions for comfort, log rolling Restrictions Weight Bearing Restrictions: No    Mobility  Bed Mobility Overal bed mobility: Needs Assistance Bed Mobility: Sit to Supine       Sit to supine: Supervision   General bed mobility comments: Described sit to sidelie and rolling technqiue to pt, and she attempted, but was unable to get feet into bed without assist; Able to get feet into bed by not using log roll  technqiue  Transfers Overall transfer level: Needs assistance Equipment used: Rolling walker (2 wheeled) Transfers: Sit to/from Stand Sit to Stand: Supervision         General transfer comment: Supervision for safety, good hand placement.  Ambulation/Gait Ambulation/Gait assistance: Min guard Ambulation Distance (Feet): 20 Feet(total) Assistive device: Rolling walker (2 wheeled)       General Gait Details: Cues to self-monitor for activity tolerance; walked a short distance with the cane and signifcant hardship; recommend RW for amb   Stairs Stairs: Yes   Stair Management: Two rails;With cane;One rail Left;Alternating pattern Number of Stairs: 3 General stair comments: Cues for sequence and technique; tending to reach out for support from rails; used cane for a few steps  Wheelchair Mobility    Modified Rankin (Stroke Patients Only)       Balance Overall balance assessment: Needs assistance Sitting-balance support: Feet supported;No upper extremity supported Sitting balance-Leahy Scale: Good     Standing balance support: During functional activity;No upper extremity supported Standing balance-Leahy Scale: Fair                              Cognition Arousal/Alertness: Awake/alert Behavior During Therapy: WFL for tasks assessed/performed Overall Cognitive Status: Within Functional Limits for tasks assessed                                        Exercises      General Comments  Pertinent Vitals/Pain Pain Assessment: Faces Faces Pain Scale: Hurts little more Pain Location: back, R hip Pain Descriptors / Indicators: Discomfort;Grimacing Pain Intervention(s): Monitored during session    Home Living Family/patient expects to be discharged to:: Private residence Living Arrangements: Other relatives Available Help at Discharge: Family Type of Home: House Home Access: Stairs to enter Entrance Stairs-Rails: Right Home  Layout: Two level;Bed/bath upstairs;1/2 bath on main level Home Equipment: Cane - single point Additional Comments: THe above information is describing brother's place, where she stays in [place]; lives and works in [place]    Prior Function Level of Independence: Independent with assistive device(s)      Comments: Cane typically for amb   PT Goals (current goals can now be found in the care plan section) Acute Rehab PT Goals Patient Stated Goal: less pain PT Goal Formulation: With patient Time For Goal Achievement: 03/27/17 Potential to Achieve Goals: Good Progress towards PT goals: Progressing toward goals    Frequency    Min 5X/week      PT Plan Current plan remains appropriate    Co-evaluation              AM-PAC PT "6 Clicks" Daily Activity  Outcome Measure  Difficulty turning over in bed (including adjusting bedclothes, sheets and blankets)?: A Little Difficulty moving from lying on back to sitting on the side of the bed? : A Little Difficulty sitting down on and standing up from a chair with arms (e.g., wheelchair, bedside commode, etc,.)?: A Little Help needed moving to and from a bed to chair (including a wheelchair)?: A Little Help needed walking in hospital room?: A Little Help needed climbing 3-5 steps with a railing? : A Little 6 Click Score: 18    End of Session Equipment Utilized During Treatment: Gait belt Activity Tolerance: Patient limited by pain Patient left: in bed;with call bell/phone within reach Nurse Communication: Mobility status PT Visit Diagnosis: Pain;Muscle weakness (generalized) (M62.81);Difficulty in walking, not elsewhere classified (R26.2) Pain - Right/Left: Right Pain - part of body: Leg(and back)     Time: 5176-1607 PT Time Calculation (min) (ACUTE ONLY): 17 min  Charges:  $Gait Training: 8-22 mins                    G Codes:       Roney Marion, PT  Acute Rehabilitation Services Pager [phone] Office  [place] 03/20/2017, 4:13 PM

## 2017-03-20 NOTE — Consult Note (Signed)
Chief Complaint   Chief Complaint  Patient presents with  . Leg Swelling    HPI   HPI: Deborah [name] is a 52 y.o. female who presented to ER yesterday due to worsening back pain and leg weakness. Roughly 3 weeks ago developed acute onset lower back pain that radiated down legs both anteriorly and posteriorly. Associated with numbness/tingling along same distribution. Pain more severe with ambulation and when she sits to rest, pain improves significantly. She feels unsteady on feet - no falls/serious injury. Walking with rolling walker for the past 1-2 weeks due to gait instability. Denies bowel/bladder dysfunction. No history of lumbar surgery.   Of note, she is visiting her mother and resides in [place]. Wishes to have care up in [place].  Patient Active Problem List   Diagnosis Date Noted  . Back pain 03/19/2017  . Stroke (cerebrum) ([place]) 03/19/2017  . GERD (gastroesophageal reflux disease) 03/19/2017  . Depression 03/19/2017  . Obesity   . Urinary tract infection 08/16/2016  . Anemia 08/16/2016  . Gastroparesis 08/16/2016  . Intractable nausea and vomiting 06/17/2016  . Diabetic gastroparesis ([place]) 06/05/2016  . Gout 06/05/2016  . AKI (acute kidney injury) ([place]) 12/06/2015  . Hypokalemia 09/26/2015  . Hypomagnesemia 09/26/2015  . Nausea and vomiting 08/20/2015  . Gout flare 05/27/2015  . Abdominal pain 05/26/2015  . DKA (diabetic ketoacidoses) ([place]) 05/25/2015  . Uncontrolled type 2 diabetes mellitus with diabetic neuropathy, with long-term current use of insulin ([place]) 05/25/2015  . Dyslipidemia associated with type 2 diabetes mellitus ([place]) 05/25/2015  . CKD (chronic kidney disease), stage II 05/25/2015  . Essential hypertension, benign 09/28/2013    PMH: Past Medical History:  Diagnosis Date  . Chest pain 12/2015  . Diabetes mellitus   . Fibromyalgia   . Gastric ulcer   . Gastroparesis   . Gout   . Hyperlipidemia   . Hypertension   . Obesity   .  Pneumonia   . Stroke [facility]) 02/2011    PSH: Past Surgical History:  Procedure Laterality Date  . CATARACT EXTRACTION  01/2014  . CHOLECYSTECTOMY      Medications Prior to Admission  Medication Sig Dispense Refill Last Dose  . allopurinol (ZYLOPRIM) 100 MG tablet Take 1 tablet (100 mg total) by mouth daily. 30 tablet 0 Past Month at Unknown time  . amLODipine (NORVASC) 10 MG tablet Take 1 tablet (10 mg total) by mouth daily. 30 tablet 0 Past Week at Unknown time  . atorvastatin (LIPITOR) 20 MG tablet Take 20 mg by mouth at bedtime.   Past Week at Unknown time  . DULoxetine (CYMBALTA) 30 MG capsule Take 1 capsule (30 mg total) by mouth 2 (two) times daily. 60 capsule 3 Past Week at Unknown time  . methylPREDNISolone (MEDROL DOSEPAK) 4 MG TBPK tablet Take 1 tablet by mouth See admin instructions. Use as directed on package   Past Week at Unknown time  . metoCLOPramide (REGLAN) 10 MG tablet Take 1 tablet (10 mg total) by mouth 3 (three) times daily before meals. 90 tablet 0 Past Week at Unknown time  . metoprolol succinate (TOPROL-XL) 25 MG 24 hr tablet Take 1 tablet (25 mg total) by mouth daily. 30 tablet 0 Past Week at Unknown time  . NOVOLOG MIX 70/30 (70-30) 100 UNIT/ML injection 75 units in am, 60 units  lunch, and 50 units pm (Patient taking differently: Inject 50-75 Units into the skin See admin instructions. Inject 75 unit subcutaneously with breakfast, 60 units  with lunch and 50 units with supper) 10 mL 2 03/18/2017 at Unknown time  . pantoprazole (PROTONIX) 40 MG tablet Take 1 tablet (40 mg total) by mouth 2 (two) times daily. 60 tablet 0 Past Week at Unknown time  . sucralfate (CARAFATE) 1 g tablet Take 1 tablet (1 g total) by mouth 4 (four) times daily -  with meals and at bedtime. 60 tablet 0 Past Week at Unknown time  . albuterol (PROVENTIL HFA;VENTOLIN HFA) 108 (90 Base) MCG/ACT inhaler Inhale 2 puffs into the lungs every 6 (six) hours as needed for wheezing or shortness of breath.  (Patient not taking: Reported on 03/19/2017) 1 Inhaler 3 Not Taking at Unknown time  . benzonatate (TESSALON) 100 MG capsule Take 1 capsule (100 mg total) by mouth 3 (three) times daily as needed for cough. (Patient not taking: Reported on 03/19/2017) 20 capsule 0 Not Taking at Unknown time  . Magnesium Oxide 400 (240 Mg) MG TABS Take 1 tablet (400 mg total) by mouth daily at 2 PM. (Patient not taking: Reported on 03/19/2017) 30 tablet 0 Not Taking at Unknown time  . nitroGLYCERIN (NITROSTAT) 0.4 MG SL tablet Place 1 tablet (0.4 mg total) under the tongue every 5 (five) minutes as needed for chest pain. 20 tablet 0 unknown  . potassium chloride (K-DUR) 10 MEQ tablet Take 2 tablets (20 mEq total) by mouth daily. (Patient not taking: Reported on 03/19/2017) 14 tablet 0 Not Taking at Unknown time    SH: Social History   Tobacco Use  . Smoking status: Never Smoker  . Smokeless tobacco: Never Used  Substance Use Topics  . Alcohol use: No  . Drug use: No    MEDS: Prior to Admission medications   Medication Sig Start Date End Date Taking? Authorizing Provider  allopurinol (ZYLOPRIM) 100 MG tablet Take 1 tablet (100 mg total) by mouth daily. 09/23/15  Yes Debbe Odea, MD  amLODipine (NORVASC) 10 MG tablet Take 1 tablet (10 mg total) by mouth daily. 06/08/16  Yes Nita Sells, MD  atorvastatin (LIPITOR) 20 MG tablet Take 20 mg by mouth at bedtime.   Yes [provider]  DULoxetine (CYMBALTA) 30 MG capsule Take 1 capsule (30 mg total) by mouth 2 (two) times daily. 09/23/15  Yes Debbe Odea, MD  methylPREDNISolone (MEDROL DOSEPAK) 4 MG TBPK tablet Take 1 tablet by mouth See admin instructions. Use as directed on package 03/16/17  Yes [provider]  metoCLOPramide (REGLAN) 10 MG tablet Take 1 tablet (10 mg total) by mouth 3 (three) times daily before meals. 08/23/16  Yes Florencia Reasons, MD  metoprolol succinate (TOPROL-XL) 25 MG 24 hr tablet Take 1 tablet (25 mg total) by mouth daily.  06/08/16  Yes Nita Sells, MD  NOVOLOG MIX 70/30 (70-30) 100 UNIT/ML injection 75 units in am, 60 units  lunch, and 50 units pm Patient taking differently: Inject 50-75 Units into the skin See admin instructions. Inject 75 unit subcutaneously with breakfast, 60 units with lunch and 50 units with supper 06/08/16  Yes Nita Sells, MD  pantoprazole (PROTONIX) 40 MG tablet Take 1 tablet (40 mg total) by mouth 2 (two) times daily. 06/08/16  Yes Nita Sells, MD  sucralfate (CARAFATE) 1 g tablet Take 1 tablet (1 g total) by mouth 4 (four) times daily -  with meals and at bedtime. 08/23/16  Yes Florencia Reasons, MD  albuterol (PROVENTIL HFA;VENTOLIN HFA) 108 (90 Base) MCG/ACT inhaler Inhale 2 puffs into the lungs every 6 (six) hours as needed  for wheezing or shortness of breath. Patient not taking: Reported on 03/19/2017 09/23/15   Debbe Odea, MD  benzonatate (TESSALON) 100 MG capsule Take 1 capsule (100 mg total) by mouth 3 (three) times daily as needed for cough. Patient not taking: Reported on 03/19/2017 08/23/16   Florencia Reasons, MD  Magnesium Oxide 400 (240 Mg) MG TABS Take 1 tablet (400 mg total) by mouth daily at 2 PM. Patient not taking: Reported on 03/19/2017 08/23/16   Florencia Reasons, MD  nitroGLYCERIN (NITROSTAT) 0.4 MG SL tablet Place 1 tablet (0.4 mg total) under the tongue every 5 (five) minutes as needed for chest pain. 12/20/15   [name], [name], MD  potassium chloride (K-DUR) 10 MEQ tablet Take 2 tablets (20 mEq total) by mouth daily. Patient not taking: Reported on 03/19/2017 08/13/16 08/20/16  [name], MD    ALLERGY: Allergies  Allergen Reactions  . Lisinopril Anaphylaxis    Tongue and mouth swelling  . Penicillins Palpitations    Has patient had a PCN reaction causing immediate rash, facial/tongue/throat swelling, SOB or lightheadedness with hypotension: Yes, heart races Has patient had a PCN reaction causing severe rash involving mucus membranes or skin necrosis:  No Has patient had a PCN reaction that required hospitalization: Yes  Has patient had a PCN reaction occurring within the last 10 years: No   . Valium [Diazepam] [name]  . Aspirin Other (See Comments)    Irritates stomach ulcer   . Food Hives and Swelling    Carrots, ketchup   . Nsaids Other (See Comments)    ULCER  . Tylenol [Acetaminophen] Other (See Comments)    Irritates stomach ulcer   . Tramadol Nausea And Vomiting    Social History   Tobacco Use  . Smoking status: Never Smoker  . Smokeless tobacco: Never Used  Substance Use Topics  . Alcohol use: No     Family History  Problem Relation Age of Onset  . Diabetes Mother   . Diabetes Father   . Heart disease Father   . Diabetes Sister   . Congestive Heart Failure Sister 107  . Diabetes Brother      ROS   Review of Systems  Constitutional: Negative.   HENT: Negative.   Eyes: Negative.   Respiratory: Negative.   Cardiovascular: Negative.   Gastrointestinal: Negative.   Genitourinary: Negative.   Musculoskeletal: Positive for back pain and myalgias. Negative for falls, joint pain and neck pain.  Skin: Negative.   Neurological: Positive for tingling (BLE). Negative for dizziness, tremors, sensory change, speech change, focal weakness, seizures, loss of consciousness and headaches.    Exam   Vitals:   03/19/17 2358 03/20/17 0742  BP: (!) 158/92 122/83  Pulse: (!) 104 (!) 113  Resp: 18 16  Temp: 98.5 F (36.9 C) 98.6 F (37 C)  SpO2: 99% 98%   General appearance: WDWN, resting comfortably in recliner, NAD Eyes: PERRL, Fundoscopic: normal Cardiovascular: Regular rate and rhythm without murmurs, rubs, gallops. No edema or variciosities. Distal pulses normal. Pulmonary: Clear to auscultation Musculoskeletal:    Muscle tone upper extremities: Normal    Muscle tone lower extremities: Normal    Motor exam: Upper Extremities Deltoid Bicep Tricep Grip  Right 5/5 5/5 5/5 5/5  Left 5/5 5/5 5/5  5/5   Lower Extremity IP Quad PF DF EHL  Right 5/5 4/5 4/5 4-/5 4-/5  Left 5/5 4/5 4/5 4-/5 4-/5   Neurological Awake, alert, oriented Memory and concentration grossly intact  Speech fluent, appropriate CNII: Visual fields normal CNIII/IV/VI: EOMI CNV: Facial sensation normal CNVII: Symmetric, normal strength CNVIII: Grossly normal CNIX: Normal palate movement CNXI: Trap and SCM strength normal CN XII: Tongue protrusion normal Sensation grossly intact to LT DTR: Normal Coordination (finger/nose & heel/shin): Normal  Results - Imaging/Labs   Results for orders placed or performed during the hospital encounter of 03/19/17 (from the past 48 hour(s))  CBC with Differential/Platelet     Status: Abnormal   Collection Time: 03/19/17 10:56 AM  Result Value Ref Range   WBC 10.4 4.0 - 10.5 K/uL   RBC 3.47 (L) 3.87 - 5.11 MIL/uL   Hemoglobin 10.1 (L) 12.0 - 15.0 g/dL   HCT 30.9 (L) 36.0 - 46.0 %   MCV 89.0 78.0 - 100.0 fL   MCH 29.1 26.0 - 34.0 pg   MCHC 32.7 30.0 - 36.0 g/dL   RDW 14.3 11.5 - 15.5 %   Platelets 388 150 - 400 K/uL   Neutrophils Relative % 55 %   Neutro Abs 5.8 1.7 - 7.7 K/uL   Lymphocytes Relative 38 %   Lymphs Abs 3.9 0.7 - 4.0 K/uL   Monocytes Relative 5 %   Monocytes Absolute 0.5 0.1 - 1.0 K/uL   Eosinophils Relative 1 %   Eosinophils Absolute 0.1 0.0 - 0.7 K/uL   Basophils Relative 1 %   Basophils Absolute 0.1 0.0 - 0.1 K/uL  Comprehensive metabolic panel     Status: Abnormal   Collection Time: 03/19/17 10:56 AM  Result Value Ref Range   Sodium 138 135 - 145 mmol/L   Potassium 3.1 (L) 3.5 - 5.1 mmol/L   Chloride 103 101 - 111 mmol/L   CO2 26 22 - 32 mmol/L   Glucose, Bld 235 (H) 65 - 99 mg/dL   BUN 15 6 - 20 mg/dL   Creatinine, Ser 1.04 (H) 0.44 - 1.00 mg/dL   Calcium 9.1 8.9 - 10.3 mg/dL   Total Protein 7.0 6.5 - 8.1 g/dL   Albumin 3.3 (L) 3.5 - 5.0 g/dL   AST 26 15 - 41 U/L   ALT 17 14 - 54 U/L   Alkaline Phosphatase 80 38 - 126 U/L   Total  Bilirubin 1.0 0.3 - 1.2 mg/dL   GFR calc non Af Amer >60 >60 mL/min   GFR calc Af Amer >60 >60 mL/min    Comment: (NOTE) The eGFR has been calculated using the CKD EPI equation. This calculation has not been validated in all clinical situations. eGFR's persistently <60 mL/min signify possible Chronic Kidney Disease.    Anion gap 9 5 - 15  Brain natriuretic peptide     Status: None   Collection Time: 03/19/17 10:56 AM  Result Value Ref Range   B Natriuretic Peptide 47.7 0.0 - 100.0 pg/mL  Urinalysis, Routine w reflex microscopic     Status: Abnormal   Collection Time: 03/19/17  3:10 PM  Result Value Ref Range   Color, Urine YELLOW YELLOW   APPearance CLEAR CLEAR   Specific Gravity, Urine 1.022 1.005 - 1.030   pH 5.0 5.0 - 8.0   Glucose, UA 50 (A) NEGATIVE mg/dL   Hgb urine dipstick MODERATE (A) NEGATIVE   Bilirubin Urine NEGATIVE NEGATIVE   Ketones, ur 5 (A) NEGATIVE mg/dL   Protein, ur >=300 (A) NEGATIVE mg/dL   Nitrite NEGATIVE NEGATIVE   Leukocytes, UA NEGATIVE NEGATIVE   RBC / HPF 6-30 0 - 5 RBC/hpf   WBC, UA 0-5 0 - 5  WBC/hpf   Bacteria, UA FEW (A) NONE SEEN   Squamous Epithelial / LPF 0-5 (A) NONE SEEN   Mucus PRESENT    Hyaline Casts, UA PRESENT   Glucose, capillary     Status: Abnormal   Collection Time: 03/20/17  7:09 AM  Result Value Ref Range   Glucose-Capillary 337 (H) 65 - 99 mg/dL  Magnesium     Status: Abnormal   Collection Time: 03/20/17  7:14 AM  Result Value Ref Range   Magnesium 1.5 (L) 1.7 - 2.4 mg/dL  Basic metabolic panel     Status: Abnormal   Collection Time: 03/20/17  7:14 AM  Result Value Ref Range   Sodium 138 135 - 145 mmol/L   Potassium 3.8 3.5 - 5.1 mmol/L   Chloride 102 101 - 111 mmol/L   CO2 26 22 - 32 mmol/L   Glucose, Bld 350 (H) 65 - 99 mg/dL   BUN 15 6 - 20 mg/dL   Creatinine, Ser 1.03 (H) 0.44 - 1.00 mg/dL   Calcium 8.9 8.9 - 10.3 mg/dL   GFR calc non Af Amer >60 >60 mL/min   GFR calc Af Amer >60 >60 mL/min    Comment:  (NOTE) The eGFR has been calculated using the CKD EPI equation. This calculation has not been validated in all clinical situations. eGFR's persistently <60 mL/min signify possible Chronic Kidney Disease.    Anion gap 10 5 - 15  CBC     Status: Abnormal   Collection Time: 03/20/17  7:14 AM  Result Value Ref Range   WBC 8.7 4.0 - 10.5 K/uL   RBC 3.46 (L) 3.87 - 5.11 MIL/uL   Hemoglobin 9.6 (L) 12.0 - 15.0 g/dL   HCT 31.0 (L) 36.0 - 46.0 %   MCV 89.6 78.0 - 100.0 fL   MCH 27.7 26.0 - 34.0 pg   MCHC 31.0 30.0 - 36.0 g/dL   RDW 14.2 11.5 - 15.5 %   Platelets 387 150 - 400 K/uL  Glucose, capillary     Status: Abnormal   Collection Time: 03/20/17 11:30 AM  Result Value Ref Range   Glucose-Capillary 284 (H) 65 - 99 mg/dL    Dg Chest 2 View  Result Date: 03/19/2017 CLINICAL DATA:  Bilateral leg pain for 1 week. EXAM: CHEST  2 VIEW COMPARISON:  August 22, 2016 FINDINGS: The heart size and mediastinal contours are within normal limits. Both lungs are clear. The visualized skeletal structures are unremarkable. IMPRESSION: No active cardiopulmonary disease. Electronically Signed   By: Dorise Bullion III M.D   On: 03/19/2017 10:57   Dg Lumbar Spine Complete  Result Date: 03/19/2017 CLINICAL DATA:  Bilateral leg pain for 1 week. EXAM: LUMBAR SPINE - COMPLETE 4+ VIEW COMPARISON:  None. FINDINGS: Grade 1 anterolisthesis of L4 versus L5. No other malalignment. No fractures identified. Mild lower lumbar facet degenerative changes. No other acute abnormalities. IMPRESSION: Grade 1 anterolisthesis of L4 versus L5. Mild lower lumbar facet degenerative changes. Electronically Signed   By: Dorise Bullion III M.D   On: 03/19/2017 10:56   Ct Head Wo Contrast  Result Date: 03/19/2017 CLINICAL DATA:  Lower extremity weakness. EXAM: CT HEAD WITHOUT CONTRAST TECHNIQUE: Contiguous axial images were obtained from the base of the skull through the vertex without intravenous contrast. COMPARISON:  None. FINDINGS:  Brain: No acute intracranial abnormality. Specifically, no hemorrhage, hydrocephalus, mass lesion, acute infarction, or significant intracranial injury. Vascular: No hyperdense vessel or unexpected calcification. Skull: No acute calvarial abnormality. Sinuses/Orbits: Visualized  paranasal sinuses and mastoids clear. Orbital soft tissues unremarkable. Other: None IMPRESSION: No intracranial abnormality. Electronically Signed   By: Rolm Baptise M.D.   On: 03/19/2017 15:52   Mr Lumbar Spine Wo Contrast  Result Date: 03/19/2017 CLINICAL DATA:  52 y/o F; lower back pain and weakness down both legs for 1 week. EXAM: MRI LUMBAR SPINE WITHOUT CONTRAST TECHNIQUE: Multiplanar, multisequence MR imaging of the lumbar spine was performed. No intravenous contrast was administered. COMPARISON:  07/04/2016 CT abdomen and pelvis FINDINGS: Segmentation:  Standard. Alignment: Mild dextrocurvature with apex at L3. Grade 1 L4-5 anterolisthesis. Vertebrae: Mild edema surrounding the L4-5 facets and trace facet effusions, likely degenerative. Edema within the bilateral L5 and right L4 pedicles without appreciable fracture, probable stress reaction. No evidence for discitis. No loss of vertebral body height. Conus medullaris and cauda equina: Conus extends to the L1 level. Conus and cauda equina appear normal. Paraspinal and other soft tissues: Negative. Disc levels: L1-2: No significant disc displacement, foraminal stenosis, or canal stenosis. L2-3: No significant disc displacement, foraminal stenosis, or canal stenosis. L3-4: Minimal disc bulge and mild facet hypertrophy. No significant foraminal or canal stenosis. L4-5: 1 anterolisthesis with small uncovered disc bulge and severe facet hypertrophy. Moderate to severe bilateral foraminal stenosis. Moderate to severe canal stenosis. Lateral recess effacement. L5-S1: Small disc bulge, endplate marginal osteophytes, small central disc protrusion, and mild facet hypertrophy. Severe right  foraminal and mild left foraminal stenosis. No significant canal stenosis. IMPRESSION: 1. Mild lumbar spine dextrocurvature and grade 1 L4-5 anterolisthesis. 2. Mild L4-5 facet edema, likely degenerative. 3. Edema within bilateral L5 and right L4 pedicles without appreciable fracture, probably stress reaction. 4. Lumbar spondylosis greatest at L4-5 and L5-S1 levels. 5. Moderate to severe bilateral L4-5 and severe right mild left L5-S1 foraminal stenosis. 6. Moderate to severe L4-5 canal stenosis. Electronically Signed   By: Kristine Garbe M.D.   On: 03/19/2017 19:48    Impression/Plan   52 y.o. female with lumbar radiculopathy with symptoms consistent with neurogenic claudication secondary to moderate-severe bilateral L4-5 and L5-S1 foraminal stenosis, moderate :4-5 canal stenosis & grade 1 anterolisthesis with facet edema seen on MRI. Neuro deficits listed as above. Although she does not require emergent NS intervention, she will likely need intervention in the near future. She wishes to have care where she is from in [place]. Rec adequate pain control, working with therapy. She will f/u with [facility] in [place]. I have advised her to call our office if she needs a referral/paperwork faxed.  Please call for any concerns.

## 2017-03-20 NOTE — Evaluation (Signed)
Occupational Therapy Evaluation Patient Details Name: DUTCHESS CROSLAND MRN: [id number] DOB: 03-17-1965 Today's Date: 03/20/2017    History of Present Illness Jeannifer GARA KINCADE is a 52 y.o. female with medical history significant of morbid obesity, hypertension, hyperlipidemia, diabetes mellitus, stroke, GERD, gout, depression, gastric ulcer, gastroparesis, fibromyalgia, CK-2, who presents with back pain. Patient states that she has been having lower back pain for more than one week, which has been progressively getting worse. It is located in the lower back, constant, sharp, 9 out of 10 in severity, radiating down to both legs. Patient cannot walk normally due to severe pain. this due to degenerative disc disease and spinal stenosis as evidenced by MRI   Clinical Impression   Pt reports she has been managing ADL with mod I PTA. Currently pt overall min guard-supervision for ADL and functional mobility. Pt planning to d/c home with supervision from family. Pt would benefit from continued skilled OT to address established goals.    Follow Up Recommendations  No OT follow up;Supervision - Intermittent    Equipment Recommendations  None recommended by OT    Recommendations for Other Services       Precautions / Restrictions Precautions Precautions: Back(for comfort) Precaution Booklet Issued: No Restrictions Weight Bearing Restrictions: No      Mobility Bed Mobility               General bed mobility comments: Pt OOB upon arrival  Transfers Overall transfer level: Needs assistance Equipment used: Rolling walker (2 wheeled) Transfers: Sit to/from Stand Sit to Stand: Supervision         General transfer comment: Supervision for safety, good hand placement.    Balance Overall balance assessment: Needs assistance Sitting-balance support: Feet supported;No upper extremity supported Sitting balance-Leahy Scale: Good     Standing balance support: During functional  activity;No upper extremity supported Standing balance-Leahy Scale: Fair                             ADL either performed or assessed with clinical judgement   ADL Overall ADL's : Needs assistance/impaired Eating/Feeding: Set up;Sitting   Grooming: Supervision/safety;Standing;Wash/dry hands   Upper Body Bathing: Set up;Supervision/ safety;Sitting   Lower Body Bathing: Min guard;Sit to/from stand   Upper Body Dressing : Set up;Supervision/safety;Sitting   Lower Body Dressing: Min guard;Sit to/from stand   Toilet Transfer: Supervision/safety;Ambulation;Comfort height toilet;RW   Toileting- Clothing Manipulation and Hygiene: Supervision/safety;Sit to/from stand     Tub/Shower Transfer Details (indicate cue type and reason): Attempted tub transfer, pt unable to clear feet over side of tub. Pt has tub bench in storage, discussed need for tub bench for safety with tub transfers at home or sponge bathing. Functional mobility during ADLs: Supervision/safety;Min guard;Rolling walker(min guard with fatigue)       Vision         Perception     Praxis      Pertinent Vitals/Pain Pain Assessment: Faces Faces Pain Scale: Hurts little more Pain Location: back, R hip Pain Descriptors / Indicators: Discomfort;Grimacing Pain Intervention(s): Monitored during session;Repositioned     Hand Dominance     Extremity/Trunk Assessment Upper Extremity Assessment Upper Extremity Assessment: Overall WFL for tasks assessed   Lower Extremity Assessment Lower Extremity Assessment: Defer to PT evaluation   Cervical / Trunk Assessment Cervical / Trunk Assessment: Other exceptions Cervical / Trunk Exceptions: back pain   Communication Communication Communication: No difficulties   Cognition Arousal/Alertness: Awake/alert Behavior During  Therapy: WFL for tasks assessed/performed Overall Cognitive Status: Within Functional Limits for tasks assessed                                      General Comments       Exercises     Shoulder Instructions      Home Living Family/patient expects to be discharged to:: Private residence Living Arrangements: Other relatives Available Help at Discharge: Family Type of Home: House Home Access: Stairs to enter Technical brewer of Steps: 1 Entrance Stairs-Rails: Right Home Layout: Two level;Bed/bath upstairs;1/2 bath on main level Alternate Level Stairs-Number of Steps: flight Alternate Level Stairs-Rails: Left Bathroom Shower/Tub: Tub/shower unit;Curtain   Bathroom Toilet: Standard     Home Equipment: Cane - single point   Additional Comments: THe above information is describing brother's place, where she stays in [place]; lives and works in [place]      Prior Functioning/Environment Level of Independence: Independent with assistive device(s)        Comments: Cane typically for amb        OT Problem List: Decreased activity tolerance;Impaired balance (sitting and/or standing);Decreased knowledge of use of DME or AE;Decreased knowledge of precautions;Pain;Obesity      OT Treatment/Interventions: Self-care/ADL training;Energy conservation;DME and/or AE instruction;Therapeutic activities;Patient/family education;Balance training    OT Goals(Current goals can be found in the care plan section) Acute Rehab OT Goals Patient Stated Goal: less pain OT Goal Formulation: With patient Time For Goal Achievement: 04/03/17 Potential to Achieve Goals: Good ADL Goals Pt Will Perform Lower Body Bathing: with modified independence;sit to/from stand(with or without AE) Pt Will Perform Lower Body Dressing: with modified independence;sit to/from stand(with or without AE) Pt Will Perform Tub/Shower Transfer: Tub transfer;with modified independence;ambulating;tub bench;rolling walker Additional ADL Goal #1: Pt will independently verbally recall 3/3 back precautions and maintain thorughout ADL.  OT Frequency:  Min 2X/week   Barriers to D/C: Inaccessible home environment  flight of stairs to bed/bath       Co-evaluation              AM-PAC PT "6 Clicks" Daily Activity     Outcome Measure Help from another person eating meals?: None Help from another person taking care of personal grooming?: A Little Help from another person toileting, which includes using toliet, bedpan, or urinal?: A Little Help from another person bathing (including washing, rinsing, drying)?: A Little Help from another person to put on and taking off regular upper body clothing?: A Little Help from another person to put on and taking off regular lower body clothing?: A Little 6 Click Score: 19   End of Session Equipment Utilized During Treatment: Rolling walker  Activity Tolerance: Patient tolerated treatment well Patient left: Other (comment)(sitting EOB with PT)  OT Visit Diagnosis: Other abnormalities of gait and mobility (R26.89);Pain Pain - part of body: (back)                Time: 3016-0109 OT Time Calculation (min): 32 min Charges:  OT General Charges $OT Visit: 1 Visit OT Evaluation $OT Eval Moderate Complexity: 1 Mod OT Treatments $Self Care/Home Management : 8-22 mins G-Codes:     Angelis Gates A. Ulice Brilliant, M.S., OTR/L Pager: [place] 03/20/2017, 3:16 PM

## 2017-03-20 NOTE — Progress Notes (Signed)
PROGRESS NOTE    Deborah [name]  BSW:[id number] DOB: 04-Jan-1966 DOA: 03/19/2017 PCP: Patient, No Pcp Per   Chief Complaint  Patient presents with  . Leg Swelling    Brief Narrative:  HPI On 03/19/2017 by Dr. Ivor Costa [name] is a 52 y.o. female with medical history significant of morbid obesity, hypertension, hyperlipidemia, diabetes mellitus, stroke, GERD, gout, depression, gastric ulcer, gastroparesis, fibromyalgia, CK-2, who presents with back pain.  Patient states that she has been having lower back pain for more than one week, which has been progressively getting worse. It is located in the lower back, constant, sharp, 9 out of 10 in severity, radiating down to both legs. Patient cannot walk normally due to severe pain. He has leg weakness, but no numbness or tingliness. No urinary incontinence or loss of control for bowel movement. No fever or chills. Patient states that she has intermittent mild SOB, but no chest pain, cough. Patient states that she had one loose stool today, and I feel nauseous, but no vomiting or diarrhea. Denies symptoms of UTI.   Assessment & Plan   Back pain -Has Been Ongoing for Approximately One Half Months However over the Last Week Has Worsened. Patient States She Is Unable to Move Her Legs. -MRI obtained showing mild lumbar spine dextrocurvature and grade 1 L4-5 anterior listhesis. Mild L4-5 facet edema, likely degenerative. Edema within bilateral L5 and right L4 pedicles. Lumbar spondylosis greatest at L4-5, L5-S1 levels. A moderate to severe bilateral L4-5 and severe right mild left L5-S1 foraminal stenosis. Moderate to severe L4-5 canal stenosis. -Neurosurgery consulted and appreciated-does not require any emergent neurosurgery intervention, patient will follow neurosurgery in [place] where she is from. -Continue pain control -PT recommended outpatient physical therapy  Essential hypertension -continue amlodipine, metoprolol,  hydralazine as needed  Diabetes mellitus, type II -continue Insulin 70/30, sliding scale, and CBG monitor   Dyslipidemia -Continue statin  Hypokalemia -Resolved, continue to monitor BMP  Hypomagnesemia  -Magnesium 1.5, will replace and continue to monitor  Gout -stable, continue allopurinol  Gastroparesis -Continue antiemetics as needed, home Reglan  History of CVA -Patient with allergy to aspirin, continue statin  GERD -Continue PPI  Depression -Continue Cymbalta  [name] -Resolved, currently not having further shortness of breath -Chest x-ray unremarkable for infection -Continue nebulizers as needed  DVT Prophylaxis  Lovenox  Code Status: Full  Family Communication: None at bedside  Disposition Plan: Observation, likely home on 01/19/2018  Consultants Neurosurgery  Procedures  None  Antibiotics   Anti-infectives (From admission, onward)   None      Subjective:   [name] seen and examined today.  Continues complain of back pain and inability to move her legs. Denies current chest pain, shortness breath, bowel pain, nausea, diarrhea constipation.  Objective:   Vitals:   03/19/17 2248 03/19/17 2332 03/19/17 2358 03/20/17 0742  BP: 130/64 (!) 142/80 (!) 158/92 122/83  Pulse: (!) 54 100 (!) 104 (!) 113  Resp: (!) 24 16 18 16   Temp: 97.8 F (36.6 C)  98.5 F (36.9 C) 98.6 F (37 C)  TempSrc: Oral  Oral Oral  SpO2: 96% 99% 99% 98%  Weight:      Height:        Intake/Output Summary (Last 24 hours) at 03/20/2017 1315 Last data filed at 03/20/2017 0900 Gross per 24 hour  Intake 240 ml  Output -  Net 240 ml   Filed Weights   03/19/17 0951  Weight: (!) 140.6 kg (  310 lb)    Exam  General: Well developed, well nourished, NAD, appears stated age  HEENT: NCAT, mucous membranes moist.   Cardiovascular: S1 S2 auscultated, no rubs, murmurs or gallops. Regular rate and rhythm.  Respiratory: Clear to auscultation  bilaterally with equal chest rise  Abdomen: Soft,obese,  nontender, nondistended, + bowel sounds  Extremities: warm dry without cyanosis clubbing or edema  Neuro: AAOx3, cranial nerves grossly intact. Strength 5/5 in patient's upper and lower extremities bilaterally  Psych: Normal affect and demeanor with intact judgement and insight   Data Reviewed: I have personally reviewed following labs and imaging studies  CBC: Recent Labs  Lab 03/19/17 1056 03/20/17 0714  WBC 10.4 8.7  NEUTROABS 5.8  --   HGB 10.1* 9.6*  HCT 30.9* 31.0*  MCV 89.0 89.6  PLT 388 466   Basic Metabolic Panel: Recent Labs  Lab 03/19/17 1056 03/20/17 0714  NA 138 138  K 3.1* 3.8  CL 103 102  CO2 26 26  GLUCOSE 235* 350*  BUN 15 15  CREATININE 1.04* 1.03*  CALCIUM 9.1 8.9  MG  --  1.5*   GFR: Estimated Creatinine Clearance: 92.6 mL/min (A) (by C-G formula based on SCr of 1.03 mg/dL (H)). Liver Function Tests: Recent Labs  Lab 03/19/17 1056  AST 26  ALT 17  ALKPHOS 80  BILITOT 1.0  PROT 7.0  ALBUMIN 3.3*   No results for input(s): LIPASE, AMYLASE in the last 168 hours. No results for input(s): AMMONIA in the last 168 hours. Coagulation Profile: No results for input(s): INR, PROTIME in the last 168 hours. Cardiac Enzymes: No results for input(s): CKTOTAL, CKMB, CKMBINDEX, TROPONINI in the last 168 hours. BNP (last 3 results) No results for input(s): PROBNP in the last 8760 hours. HbA1C: No results for input(s): HGBA1C in the last 72 hours. CBG: Recent Labs  Lab 03/20/17 0709 03/20/17 1130  GLUCAP 337* 284*   Lipid Profile: No results for input(s): CHOL, HDL, LDLCALC, TRIG, CHOLHDL, LDLDIRECT in the last 72 hours. Thyroid Function Tests: No results for input(s): TSH, T4TOTAL, FREET4, T3FREE, THYROIDAB in the last 72 hours. Anemia Panel: No results for input(s): VITAMINB12, FOLATE, FERRITIN, TIBC, IRON, RETICCTPCT in the last 72 hours. Urine analysis:    Component Value  Date/Time   COLORURINE YELLOW 03/19/2017 1510   APPEARANCEUR CLEAR 03/19/2017 1510   LABSPEC 1.022 03/19/2017 1510   PHURINE 5.0 03/19/2017 1510   GLUCOSEU 50 (A) 03/19/2017 1510   HGBUR MODERATE (A) 03/19/2017 1510   BILIRUBINUR NEGATIVE 03/19/2017 1510   KETONESUR 5 (A) 03/19/2017 1510   PROTEINUR >=300 (A) 03/19/2017 1510   UROBILINOGEN 0.2 10/02/2013 2108   NITRITE NEGATIVE 03/19/2017 1510   LEUKOCYTESUR NEGATIVE 03/19/2017 1510   Sepsis Labs: @LABRCNTIP (procalcitonin:4,lacticidven:4)  )No results found for this or any previous visit (from the past 240 hour(s)).    Radiology Studies: Dg Chest 2 View  Result Date: 03/19/2017 CLINICAL DATA:  Bilateral leg pain for 1 week. EXAM: CHEST  2 VIEW COMPARISON:  August 22, 2016 FINDINGS: The heart size and mediastinal contours are within normal limits. Both lungs are clear. The visualized skeletal structures are unremarkable. IMPRESSION: No active cardiopulmonary disease. Electronically Signed   By: Dorise Bullion III M.D   On: 03/19/2017 10:57   Dg Lumbar Spine Complete  Result Date: 03/19/2017 CLINICAL DATA:  Bilateral leg pain for 1 week. EXAM: LUMBAR SPINE - COMPLETE 4+ VIEW COMPARISON:  None. FINDINGS: Grade 1 anterolisthesis of L4 versus L5. No other malalignment. No  fractures identified. Mild lower lumbar facet degenerative changes. No other acute abnormalities. IMPRESSION: Grade 1 anterolisthesis of L4 versus L5. Mild lower lumbar facet degenerative changes. Electronically Signed   By: Dorise Bullion III M.D   On: 03/19/2017 10:56   Ct Head Wo Contrast  Result Date: 03/19/2017 CLINICAL DATA:  Lower extremity weakness. EXAM: CT HEAD WITHOUT CONTRAST TECHNIQUE: Contiguous axial images were obtained from the base of the skull through the vertex without intravenous contrast. COMPARISON:  None. FINDINGS: Brain: No acute intracranial abnormality. Specifically, no hemorrhage, hydrocephalus, mass lesion, acute infarction, or significant  intracranial injury. Vascular: No hyperdense vessel or unexpected calcification. Skull: No acute calvarial abnormality. Sinuses/Orbits: Visualized paranasal sinuses and mastoids clear. Orbital soft tissues unremarkable. Other: None IMPRESSION: No intracranial abnormality. Electronically Signed   By: Rolm Baptise M.D.   On: 03/19/2017 15:52   Mr Lumbar Spine Wo Contrast  Result Date: 03/19/2017 CLINICAL DATA:  52 y/o F; lower back pain and weakness down both legs for 1 week. EXAM: MRI LUMBAR SPINE WITHOUT CONTRAST TECHNIQUE: Multiplanar, multisequence MR imaging of the lumbar spine was performed. No intravenous contrast was administered. COMPARISON:  07/04/2016 CT abdomen and pelvis FINDINGS: Segmentation:  Standard. Alignment: Mild dextrocurvature with apex at L3. Grade 1 L4-5 anterolisthesis. Vertebrae: Mild edema surrounding the L4-5 facets and trace facet effusions, likely degenerative. Edema within the bilateral L5 and right L4 pedicles without appreciable fracture, probable stress reaction. No evidence for discitis. No loss of vertebral body height. Conus medullaris and cauda equina: Conus extends to the L1 level. Conus and cauda equina appear normal. Paraspinal and other soft tissues: Negative. Disc levels: L1-2: No significant disc displacement, foraminal stenosis, or canal stenosis. L2-3: No significant disc displacement, foraminal stenosis, or canal stenosis. L3-4: Minimal disc bulge and mild facet hypertrophy. No significant foraminal or canal stenosis. L4-5: 1 anterolisthesis with small uncovered disc bulge and severe facet hypertrophy. Moderate to severe bilateral foraminal stenosis. Moderate to severe canal stenosis. Lateral recess effacement. L5-S1: Small disc bulge, endplate marginal osteophytes, small central disc protrusion, and mild facet hypertrophy. Severe right foraminal and mild left foraminal stenosis. No significant canal stenosis. IMPRESSION: 1. Mild lumbar spine dextrocurvature and  grade 1 L4-5 anterolisthesis. 2. Mild L4-5 facet edema, likely degenerative. 3. Edema within bilateral L5 and right L4 pedicles without appreciable fracture, probably stress reaction. 4. Lumbar spondylosis greatest at L4-5 and L5-S1 levels. 5. Moderate to severe bilateral L4-5 and severe right mild left L5-S1 foraminal stenosis. 6. Moderate to severe L4-5 canal stenosis. Electronically Signed   By: Kristine Garbe M.D.   On: 03/19/2017 19:48     Scheduled Meds: . [START ON 03/21/2017] allopurinol  100 mg Oral Daily  . amLODipine  10 mg Oral Daily  . atorvastatin  20 mg Oral QHS  . DULoxetine  30 mg Oral BID  . enoxaparin (LOVENOX) injection  40 mg Subcutaneous Q24H  . insulin aspart  0-5 Units Subcutaneous QHS  . insulin aspart  0-9 Units Subcutaneous TID WC  . insulin aspart protamine- aspart  60 Units Subcutaneous Q breakfast   And  . insulin aspart protamine- aspart  45 Units Subcutaneous Q lunch   And  . insulin aspart protamine- aspart  35 Units Subcutaneous Q supper  . metoCLOPramide  10 mg Oral TID AC  . metoprolol succinate  25 mg Oral Daily  . pantoprazole  40 mg Oral BID  . potassium chloride  40 mEq Oral Once  . prednisoLONE  20 mg Oral Daily  .  sucralfate  1 g Oral TID WC & HS   Continuous Infusions:   LOS: 0 days   Time Spent in minutes   30 minutes  Aslan Himes D.O. on 03/20/2017 at 1:15 PM  Between 7am to 7pm - Pager - [phone]  After 7pm go to [email or web address] - password [email or web address]  And look for the night coverage person covering for me after hours  [facility] Office  [phone]

## 2017-03-20 NOTE — Evaluation (Signed)
Physical Therapy Evaluation Patient Details Name: Deborah [name] MRN: [id number] DOB: 1965/10/21 Today's Date: 03/20/2017   History of Present Illness  Deborah [name] is a 52 y.o. female with medical history significant of morbid obesity, hypertension, hyperlipidemia, diabetes mellitus, stroke, GERD, gout, depression, gastric ulcer, gastroparesis, fibromyalgia, CK-2, who presents with back pain. Patient states that she has been having lower back pain for more than one week, which has been progressively getting worse. It is located in the lower back, constant, sharp, 9 out of 10 in severity, radiating down to both legs. Patient cannot walk normally due to severe pain. this due to degenerative disc disease and spinal stenosis as evidenced by MRI  Clinical Impression   Pt admitted with above diagnosis. Pt currently with functional limitations due to the deficits listed below (see PT Problem List). Presents with pain limiting mobility and ADLs; Noted the likley need for neurosurgical intervention in the near future -- she hopes to consult with someone closer to her home in [place];  Pt will benefit from skilled PT to increase their independence and safety with mobility to allow discharge to the venue listed below.    Initiated mobility training using back precautions and RW; RW is useful for support and pain control/activity tolerance with short distances; Took time to talk through follow-up options, HHPT  versus Outpt PT, and ultimately Outpt PT with a therapist who specializes in back pain is her best bet; Also advised her that with Obs status, dc is likely within 24 hours, and we discussed acute PT goals of mobility and stair training; I briefly described typical back extension exercises as well; I asked if she has options to sleep on first floor, and she tells me there aren't options; Noted she will likely be seeking Neurosurgery follow up in [place]    Follow Up Recommendations Outpatient  PT;Supervision - Intermittent    Equipment Recommendations  Rolling walker with 5" wheels;3in1 (PT);Other (comment)(Wide, to accomodate 310 lbs)    Recommendations for Other Services OT consult     Precautions / Restrictions Precautions Precautions: Back(for comfort) Precaution Booklet Issued: No Precaution Comments: Educated in Back Precautions for comfort, log rolling Restrictions Weight Bearing Restrictions: No      Mobility  Bed Mobility Overal bed mobility: Needs Assistance Bed Mobility: Rolling;Sidelying to Sit Rolling: Min guard Sidelying to sit: Min assist       General bed mobility comments: Cues for log roll technqiue for comfort  Transfers Overall transfer level: Needs assistance Equipment used: Rolling walker (2 wheeled) Transfers: Sit to/from Stand Sit to Stand: Supervision         General transfer comment: Cues for back precautions; Dependent on leaning trunk quite far forward, flexing at hips while extending knees to stand, then pushing up to upright posture with UEs on RW  Ambulation/Gait Ambulation/Gait assistance: Min guard Ambulation Distance (Feet): 30 Feet Assistive device: Rolling walker (2 wheeled)       General Gait Details: Difficulty advancing RLE with noted some circumduction; Cues to self-monitor for activity tolerance  Stairs            Wheelchair Mobility    Modified Rankin (Stroke Patients Only)       Balance Overall balance assessment: Needs assistance   Sitting balance-Leahy Scale: Good       Standing balance-Leahy Scale: Fair  Pertinent Vitals/Pain Pain Assessment: 0-10 Pain Score: 9  Pain Location: Low back; R side; shoots down back of RLE Pain Descriptors / Indicators: Aching;Shooting;Sharp;Discomfort;Grimacing Pain Intervention(s): Monitored during session;Repositioned;Heat applied    Home Living Family/patient expects to be discharged to:: Private  residence Living Arrangements: Other relatives Available Help at Discharge: Family Type of Home: House Home Access: Stairs to enter   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[employer]) Home Layout: Two level;Bed/bath upstairs;1/2 bath on main level Home Equipment: [place] - single point Additional Comments: THe above information is describing brother's place, where she stays in [place]; lives and works in [place]: Independent with assistive device(s)         Comments: Radio producer typically for [employer]        Extremity/Trunk Assessment   Upper Extremity Assessment Upper Extremity Assessment: Overall WFL for tasks assessed    Lower Extremity Assessment Lower Extremity Assessment: Generalized weakness;RLE deficits/detail RLE Deficits / Details: Shooting, sciatica-like pain down RLE, effecting gait RLE: Unable to fully assess due to pain       Communication   Communication: No difficulties  Cognition Arousal/Alertness: Awake/alert Behavior During Therapy: WFL for tasks assessed/performed Overall Cognitive Status: Within Functional Limits for tasks assessed                                        General Comments General comments (skin integrity, edema, etc.): Took time to talk through follow-up options, HHPT (not sure that her insurance status supports HHPT) versus Outpt PT, and ultimately Outpt PT with a therapist who specializes in back pain is her best bet; Alos advised her that with Obs status, dc is likely within 24 hours, and we discussed acute PT goals of mobility and stair training; I briefly described typical back extension exercises as well; I asked if she has options to sleep on first floor, and she tells me there aren't options    Exercises     Assessment/Plan    PT Assessment Patient needs continued PT services  PT Problem List Decreased strength;Decreased range of motion;Decreased  activity tolerance;Decreased balance;Decreased mobility;Decreased knowledge of use of DME;Decreased knowledge of precautions;Pain       PT Treatment Interventions DME instruction;Gait training;Stair training;Functional mobility training;Therapeutic activities;Therapeutic exercise;Patient/family education;Modalities    PT Goals (Current goals can be found in the Care Plan section)  Acute Rehab PT Goals Patient Stated Goal: less pain PT Goal Formulation: With patient Time For Goal Achievement: 03/27/17 Potential to Achieve Goals: Good    Frequency Min 5X/week   Barriers to discharge Inaccessible home environment Flight of steps to access bedroom and bathroom    Co-evaluation               AM-PAC PT "6 Clicks" Daily Activity  Outcome Measure Difficulty turning over in bed (including adjusting bedclothes, sheets and blankets)?: A Lot Difficulty moving from lying on back to sitting on the side of the bed? : A Lot Difficulty sitting down on and standing up from a chair with arms (e.g., wheelchair, bedside commode, etc,.)?: A Little Help needed moving to and from a bed to chair (including a wheelchair)?: A Little Help needed walking in hospital room?: A Little Help needed climbing 3-5 steps with a railing? : A Lot 6 Click Score: 15    End of Session Equipment Utilized During  Treatment: Gait belt Activity Tolerance: Patient limited by pain Patient left: in chair;with call bell/phone within reach Nurse Communication: Mobility status;Other (comment)(Afraid pt expectations of LOS are unrealistic) PT Visit Diagnosis: Pain;Muscle weakness (generalized) (M62.81);Difficulty in walking, not elsewhere classified (R26.2) Pain - Right/Left: Right Pain - part of body: Leg(and back)    Time: 7972-8206 PT Time Calculation (min) (ACUTE ONLY): 31 min   Charges:   PT Evaluation $PT Eval Moderate Complexity: 1 Mod PT Treatments $Gait Training: 8-22 mins   PT G Codes:        Roney Marion, PT  Acute Rehabilitation Services Pager [phone] Office [phone]   [place] 03/20/2017, 12:33 PM

## 2017-03-20 NOTE — Plan of Care (Signed)
  Progressing Education: Knowledge of General Education information will improve 03/20/2017 0953 - Progressing by Rance Muir, RN Health Behavior/Discharge Planning: Ability to manage health-related needs will improve 03/20/2017 0953 - Progressing by Rance Muir, RN Clinical Measurements: Ability to maintain clinical measurements within normal limits will improve 03/20/2017 0953 - Progressing by Rance Muir, RN Will remain free from infection 03/20/2017 0953 - Progressing by Rance Muir, RN Diagnostic test results will improve 03/20/2017 0953 - Progressing by Rance Muir, RN Respiratory complications will improve 03/20/2017 0953 - Progressing by Rance Muir, RN Cardiovascular complication will be avoided 03/20/2017 0953 - Progressing by Rance Muir, RN Activity: Risk for activity intolerance will decrease 03/20/2017 0953 - Progressing by Rance Muir, RN Nutrition: Adequate nutrition will be maintained 03/20/2017 0953 - Progressing by Rance Muir, RN Coping: Level of anxiety will decrease 03/20/2017 0953 - Progressing by Rance Muir, RN Elimination: Will not experience complications related to bowel motility 03/20/2017 0953 - Progressing by Rance Muir, RN Will not experience complications related to urinary retention 03/20/2017 0953 - Progressing by Rance Muir, RN Pain Managment: General experience of comfort will improve 03/20/2017 0953 - Progressing by Rance Muir, RN Safety: Ability to remain free from injury will improve 03/20/2017 0953 - Progressing by Rance Muir, RN Skin Integrity: Risk for impaired skin integrity will decrease 03/20/2017 0953 - Progressing by Rance Muir, RN

## 2017-03-21 LAB — BASIC METABOLIC PANEL
ANION GAP: 9 (ref 5–15)
BUN: 17 mg/dL (ref 6–20)
CALCIUM: 8.7 mg/dL — AB (ref 8.9–10.3)
CO2: 27 mmol/L (ref 22–32)
CREATININE: 0.97 mg/dL (ref 0.44–1.00)
Chloride: 104 mmol/L (ref 101–111)
Glucose, Bld: 155 mg/dL — ABNORMAL HIGH (ref 65–99)
Potassium: 2.9 mmol/L — ABNORMAL LOW (ref 3.5–5.1)
SODIUM: 140 mmol/L (ref 135–145)

## 2017-03-21 LAB — MAGNESIUM: Magnesium: 1.9 mg/dL (ref 1.7–2.4)

## 2017-03-21 LAB — GLUCOSE, CAPILLARY
GLUCOSE-CAPILLARY: 134 mg/dL — AB (ref 65–99)
GLUCOSE-CAPILLARY: 91 mg/dL (ref 65–99)
GLUCOSE-CAPILLARY: 217 mg/dL — AB (ref 65–99)
Glucose-Capillary: 262 mg/dL — ABNORMAL HIGH (ref 65–99)
Glucose-Capillary: 239 mg/dL — ABNORMAL HIGH (ref 65–99)

## 2017-03-21 MED ORDER — POTASSIUM CHLORIDE 10 MEQ/100ML IV SOLN
10.0000 meq | INTRAVENOUS | Status: AC
  Administered 2017-03-21 (×3): 10 meq via INTRAVENOUS
  Filled 2017-03-21 (×4): qty 100

## 2017-03-21 MED ORDER — POTASSIUM CHLORIDE CRYS ER 20 MEQ PO TBCR
40.0000 meq | EXTENDED_RELEASE_TABLET | Freq: Once | ORAL | Status: AC
  Administered 2017-03-21: 40 meq via ORAL
  Filled 2017-03-21: qty 2

## 2017-03-21 NOTE — Progress Notes (Signed)
PROGRESS NOTE    Deborah [name]  PQD:[id number] DOB: 12/03/1965 DOA: 03/19/2017 PCP: Patient, No Pcp Per   Chief Complaint  Patient presents with  . Leg Swelling    Brief Narrative:  HPI On 03/19/2017 by Dr. Ivor Costa Deborah [name] is a 52 y.o. female with medical history significant of morbid obesity, hypertension, hyperlipidemia, diabetes mellitus, stroke, GERD, gout, depression, gastric ulcer, gastroparesis, fibromyalgia, CK-2, who presents with back pain.  Patient states that she has been having lower back pain for more than one week, which has been progressively getting worse. It is located in the lower back, constant, sharp, 9 out of 10 in severity, radiating down to both legs. Patient cannot walk normally due to severe pain. He has leg weakness, but no numbness or tingliness. No urinary incontinence or loss of control for bowel movement. No fever or chills. Patient states that she has intermittent mild SOB, but no chest pain, cough. Patient states that she had one loose stool today, and I feel nauseous, but no vomiting or diarrhea. Denies symptoms of UTI.   Assessment & Plan   Back pain -Has Been Ongoing for Approximately One Half Months However over the Last Week Has Worsened. Patient States She Is Unable to Move Her Legs. -MRI obtained showing mild lumbar spine dextrocurvature and grade 1 L4-5 anterior listhesis. Mild L4-5 facet edema, likely degenerative. Edema within bilateral L5 and right L4 pedicles. Lumbar spondylosis greatest at L4-5, L5-S1 levels. A moderate to severe bilateral L4-5 and severe right mild left L5-S1 foraminal stenosis. Moderate to severe L4-5 canal stenosis. -Neurosurgery consulted and appreciated-does not require any emergent neurosurgery intervention, patient will follow neurosurgery in [place] where she is from. -Continue pain control- continues to receive IV dilaudid- 3 doses given on 03/20/2017. Advised patient to use more oral pain control as  IV will not be available to her on dischage -PT recommended outpatient physical therapy -Dicussed this with patient, she feels she cannot walk or stand on her own and would like to stay in the hospital for continued pain control and physical therapy. She fears she cannot ride the train back to [place].  -Case management consulted for D/C planning -will order kpad  Essential hypertension -continue amlodipine, metoprolol, hydralazine as needed  Diabetes mellitus, type II -continue Insulin 70/30, sliding scale, and CBG monitor   Dyslipidemia -Continue statin  Hypokalemia -potassium 2.9 today -will continue to replace with oral and IV supplementation -continue to monitor BMP  Hypomagnesemia  -Magnesium 1.9 today (after supplementation)  Gout -stable, continue allopurinol  Gastroparesis -Continue antiemetics as needed, home Reglan  History of CVA -Patient with allergy to aspirin, continue statin  GERD -Continue PPI  Depression -Continue Cymbalta  [name] -Resolved, currently not having further shortness of breath -Chest x-ray unremarkable for infection -Continue nebulizers as needed  DVT Prophylaxis  Lovenox  Code Status: Full  Family Communication: None at bedside  Disposition Plan: Admitted. Likely discharge within 1-2 days pending improvement in back pain  Consultants Neurosurgery  Procedures  None  Antibiotics   Anti-infectives (From admission, onward)   None      Subjective:   Deborah [name] seen and examined today.  Continues to complain of back pain, however able to move more than yesterday. Feels she needs an additional 2 days in the hospital to improve and get stronger prior to going home. Currently, she denies chest pain, shortness breath, bowel pain, nausea, diarrhea constipation.  Objective:   Vitals:   03/20/17 0742 03/20/17  1300 03/20/17 2147 03/21/17 0741  BP: 122/83 (!) 108/59 119/62 124/75  Pulse: (!) 113 (!) 101 98  87  Resp: 16 16 15 16   Temp: 98.6 F (37 C) 97.8 F (36.6 C) 97.6 F (36.4 C) (!) 97.5 F (36.4 C)  TempSrc: Oral Oral Oral Oral  SpO2: 98% 100% 98% 100%  Weight:      Height:        Intake/Output Summary (Last 24 hours) at 03/21/2017 1050 Last data filed at 03/21/2017 0930 Gross per 24 hour  Intake 840 ml  Output -  Net 840 ml   Filed Weights   03/19/17 0951  Weight: (!) 140.6 kg (310 lb)   Exam  General: Well developed, well nourished, NAD  HEENT: NCAT, mucous membranes moist.   Cardiovascular: S1 S2 auscultated, no rubs, murmurs or gallops. Regular rate and rhythm.  Respiratory: Clear to auscultation bilaterally with equal chest rise  Abdomen: Soft, obese, nontender, nondistended, + bowel sounds  Extremities: warm dry without cyanosis clubbing or edema  Neuro: AAOx3, nonfocal  Psych: appropriate  Data Reviewed: I have personally reviewed following labs and imaging studies  CBC: Recent Labs  Lab 03/19/17 1056 03/20/17 0714  WBC 10.4 8.7  NEUTROABS 5.8  --   HGB 10.1* 9.6*  HCT 30.9* 31.0*  MCV 89.0 89.6  PLT 388 177   Basic Metabolic Panel: Recent Labs  Lab 03/19/17 1056 03/20/17 0714 03/21/17 0742  NA 138 138 140  K 3.1* 3.8 2.9*  CL 103 102 104  CO2 26 26 27   GLUCOSE 235* 350* 155*  BUN 15 15 17   CREATININE 1.04* 1.03* 0.97  CALCIUM 9.1 8.9 8.7*  MG  --  1.5* 1.9   GFR: Estimated Creatinine Clearance: 97.2 mL/min (by C-G formula based on SCr of 0.97 mg/dL). Liver Function Tests: Recent Labs  Lab 03/19/17 1056  AST 26  ALT 17  ALKPHOS 80  BILITOT 1.0  PROT 7.0  ALBUMIN 3.3*   No results for input(s): LIPASE, AMYLASE in the last 168 hours. No results for input(s): AMMONIA in the last 168 hours. Coagulation Profile: No results for input(s): INR, PROTIME in the last 168 hours. Cardiac Enzymes: No results for input(s): CKTOTAL, CKMB, CKMBINDEX, TROPONINI in the last 168 hours. BNP (last 3 results) No results for input(s): PROBNP  in the last 8760 hours. HbA1C: No results for input(s): HGBA1C in the last 72 hours. CBG: Recent Labs  Lab 03/20/17 0709 03/20/17 1130 03/20/17 1639 03/20/17 2322 03/21/17 0741  GLUCAP 337* 284* 187* 156* 134*   Lipid Profile: No results for input(s): CHOL, HDL, LDLCALC, TRIG, CHOLHDL, LDLDIRECT in the last 72 hours. Thyroid Function Tests: No results for input(s): TSH, T4TOTAL, FREET4, T3FREE, THYROIDAB in the last 72 hours. Anemia Panel: No results for input(s): VITAMINB12, FOLATE, FERRITIN, TIBC, IRON, RETICCTPCT in the last 72 hours. Urine analysis:    Component Value Date/Time   COLORURINE YELLOW 03/19/2017 1510   APPEARANCEUR CLEAR 03/19/2017 1510   LABSPEC 1.022 03/19/2017 1510   PHURINE 5.0 03/19/2017 1510   GLUCOSEU 50 (A) 03/19/2017 1510   HGBUR MODERATE (A) 03/19/2017 1510   BILIRUBINUR NEGATIVE 03/19/2017 1510   KETONESUR 5 (A) 03/19/2017 1510   PROTEINUR >=300 (A) 03/19/2017 1510   UROBILINOGEN 0.2 10/02/2013 2108   NITRITE NEGATIVE 03/19/2017 1510   LEUKOCYTESUR NEGATIVE 03/19/2017 1510   Sepsis Labs: @LABRCNTIP (procalcitonin:4,lacticidven:4)  )No results found for this or any previous visit (from the past 240 hour(s)).    Radiology Studies: Ct Head  Wo Contrast  Result Date: 03/19/2017 CLINICAL DATA:  Lower extremity weakness. EXAM: CT HEAD WITHOUT CONTRAST TECHNIQUE: Contiguous axial images were obtained from the base of the skull through the vertex without intravenous contrast. COMPARISON:  None. FINDINGS: Brain: No acute intracranial abnormality. Specifically, no hemorrhage, hydrocephalus, mass lesion, acute infarction, or significant intracranial injury. Vascular: No hyperdense vessel or unexpected calcification. Skull: No acute calvarial abnormality. Sinuses/Orbits: Visualized paranasal sinuses and mastoids clear. Orbital soft tissues unremarkable. Other: None IMPRESSION: No intracranial abnormality. Electronically Signed   By: Rolm Baptise M.D.   On:  03/19/2017 15:52   Mr Lumbar Spine Wo Contrast  Result Date: 03/19/2017 CLINICAL DATA:  52 y/o F; lower back pain and weakness down both legs for 1 week. EXAM: MRI LUMBAR SPINE WITHOUT CONTRAST TECHNIQUE: Multiplanar, multisequence MR imaging of the lumbar spine was performed. No intravenous contrast was administered. COMPARISON:  07/04/2016 CT abdomen and pelvis FINDINGS: Segmentation:  Standard. Alignment: Mild dextrocurvature with apex at L3. Grade 1 L4-5 anterolisthesis. Vertebrae: Mild edema surrounding the L4-5 facets and trace facet effusions, likely degenerative. Edema within the bilateral L5 and right L4 pedicles without appreciable fracture, probable stress reaction. No evidence for discitis. No loss of vertebral body height. Conus medullaris and cauda equina: Conus extends to the L1 level. Conus and cauda equina appear normal. Paraspinal and other soft tissues: Negative. Disc levels: L1-2: No significant disc displacement, foraminal stenosis, or canal stenosis. L2-3: No significant disc displacement, foraminal stenosis, or canal stenosis. L3-4: Minimal disc bulge and mild facet hypertrophy. No significant foraminal or canal stenosis. L4-5: 1 anterolisthesis with small uncovered disc bulge and severe facet hypertrophy. Moderate to severe bilateral foraminal stenosis. Moderate to severe canal stenosis. Lateral recess effacement. L5-S1: Small disc bulge, endplate marginal osteophytes, small central disc protrusion, and mild facet hypertrophy. Severe right foraminal and mild left foraminal stenosis. No significant canal stenosis. IMPRESSION: 1. Mild lumbar spine dextrocurvature and grade 1 L4-5 anterolisthesis. 2. Mild L4-5 facet edema, likely degenerative. 3. Edema within bilateral L5 and right L4 pedicles without appreciable fracture, probably stress reaction. 4. Lumbar spondylosis greatest at L4-5 and L5-S1 levels. 5. Moderate to severe bilateral L4-5 and severe right mild left L5-S1 foraminal  stenosis. 6. Moderate to severe L4-5 canal stenosis. Electronically Signed   By: Kristine Garbe M.D.   On: 03/19/2017 19:48     Scheduled Meds: . allopurinol  100 mg Oral Daily  . amLODipine  10 mg Oral Daily  . atorvastatin  20 mg Oral QHS  . DULoxetine  30 mg Oral BID  . enoxaparin (LOVENOX) injection  40 mg Subcutaneous Q24H  . insulin aspart  0-5 Units Subcutaneous QHS  . insulin aspart  0-9 Units Subcutaneous TID WC  . insulin aspart protamine- aspart  60 Units Subcutaneous Q breakfast   And  . insulin aspart protamine- aspart  45 Units Subcutaneous Q lunch   And  . insulin aspart protamine- aspart  35 Units Subcutaneous Q supper  . metoCLOPramide  10 mg Oral TID AC  . metoprolol succinate  25 mg Oral Daily  . pantoprazole  40 mg Oral BID  . potassium chloride  40 mEq Oral Once  . prednisoLONE  20 mg Oral Daily  . sucralfate  1 g Oral TID WC & HS   Continuous Infusions:   LOS: 1 day   Time Spent in minutes   30 minutes  Kennesha Brewbaker D.O. on 03/21/2017 at 10:50 AM  Between 7am to 7pm - Pager - [phone]  After 7pm go to [email or web address] - password [email or web address]  And look for the night coverage person covering for me after hours  [facility] Office  [phone]

## 2017-03-21 NOTE — Progress Notes (Signed)
Occupational Therapy Treatment Patient Details Name: Deborah [name] MRN: [id number] DOB: Dec 24, 1965 Today's Date: 03/21/2017    History of present illness Deborah [name] is a 52 y.o. female with medical history significant of morbid obesity, hypertension, hyperlipidemia, diabetes mellitus, stroke, GERD, gout, depression, gastric ulcer, gastroparesis, fibromyalgia, CK-2, who presents with back pain. Patient states that she has been having lower back pain for more than one week, which has been progressively getting worse. It is located in the lower back, constant, sharp, 9 out of 10 in severity, radiating down to both legs. Patient cannot walk normally due to severe pain. this due to degenerative disc disease and spinal stenosis as evidenced by MRI   OT comments  Pt reports muscle relaxer has given her a "jello" feeling today and does not feel steady on her feet today. Pt states 'its better this afternoon. " pt encouraged to speak with RN and MD regarding dosage before choosing to just discontinue the medication if it did give relief. Pt able to complete bed mobility, sit<>stand and transfer to chair. Pt can complete small transfers safely but concern with patients ability to maintain household level throughout the entire home. Pt states "friend" picking her up to drive to train station is in good health and could push a wheelchair if needed.    Follow Up Recommendations  No OT follow up;Supervision - Intermittent    Equipment Recommendations  None recommended by OT    Recommendations for Other Services      Precautions / Restrictions Precautions Precautions: Back Precaution Comments: able to recall 2 out 3 back precautions       Mobility Bed Mobility Overal bed mobility: Modified Independent             General bed mobility comments: able to progress to EOB with incr time  Transfers                      Balance Overall balance assessment: Needs  assistance Sitting-balance support: Feet supported Sitting balance-Leahy Scale: Good     Standing balance support: Bilateral upper extremity supported;During functional activity Standing balance-Leahy Scale: Fair Standing balance comment: reports legs feel weak                           ADL either performed or assessed with clinical judgement   ADL Overall ADL's : Needs assistance/impaired Eating/Feeding: Independent;Sitting                   Lower Body Dressing: Min guard;Sit to/from stand;Sitting/lateral leans Lower Body Dressing Details (indicate cue type and reason): pt able to sit on the EOB and bend knee with side sitting to reach feet.  Toilet Transfer: Min guard;RW           Functional mobility during ADLs: Passenger transport manager     Praxis      Cognition Arousal/Alertness: Awake/alert Behavior During Therapy: WFL for tasks assessed/performed Overall Cognitive Status: Within Functional Limits for tasks assessed                                          Exercises     Shoulder Instructions       General Comments pt with edema in bil LE and rolling down  socks due to elastic cutting into leg    Pertinent Vitals/ Pain       Pain Assessment: Faces Faces Pain Scale: Hurts little more Pain Location: back, R hip Pain Descriptors / Indicators: Discomfort;Grimacing Pain Intervention(s): Monitored during session;Premedicated before session;Repositioned  Home Living                                          Prior Functioning/Environment              Frequency  Min 2X/week        Progress Toward Goals  OT Goals(current goals can now be found in the care plan section)  Progress towards OT goals: Progressing toward goals  Acute Rehab OT Goals Patient Stated Goal: less pain OT Goal Formulation: With patient Time For Goal Achievement: 04/03/17 Potential to  Achieve Goals: Good ADL Goals Pt Will Perform Lower Body Bathing: with modified independence;sit to/from stand Pt Will Perform Lower Body Dressing: with modified independence;sit to/from stand Pt Will Perform Tub/Shower Transfer: Tub transfer;with modified independence;ambulating;tub bench;rolling walker Additional ADL Goal #1: Pt will independently verbally recall 3/3 back precautions and maintain thorughout ADL.  Plan Discharge plan remains appropriate    Co-evaluation                 AM-PAC PT "6 Clicks" Daily Activity     Outcome Measure   Help from another person eating meals?: None Help from another person taking care of personal grooming?: A Little Help from another person toileting, which includes using toliet, bedpan, or urinal?: A Little Help from another person bathing (including washing, rinsing, drying)?: A Little Help from another person to put on and taking off regular upper body clothing?: A Little Help from another person to put on and taking off regular lower body clothing?: A Little 6 Click Score: 19    End of Session Equipment Utilized During Treatment: Rolling walker  OT Visit Diagnosis: Other abnormalities of gait and mobility (R26.89);Pain   Activity Tolerance Patient tolerated treatment well   Patient Left in chair;with call bell/phone within reach   Nurse Communication Mobility status        Time: 5830-9407 OT Time Calculation (min): 13 min  Charges: OT General Charges $OT Visit: 1 Visit OT Treatments $Self Care/Home Management : 8-22 mins   Jeri Modena   OTR/L Pager: [phone] Office: [phone] .    Parke Poisson B 03/21/2017, 2:44 PM

## 2017-03-21 NOTE — Progress Notes (Signed)
Physical Therapy Treatment Patient Details Name: Deborah [name] MRN: [id number] DOB: [birth year] Today's Date: 03/21/2017    History of Present Illness Deborah [name] is a 52 y.o. female with medical history significant of morbid obesity, hypertension, hyperlipidemia, diabetes mellitus, stroke, GERD, gout, depression, gastric ulcer, gastroparesis, fibromyalgia, CK-2, who presents with back pain. Patient states that she has been having lower back pain for more than one week, which has been progressively getting worse. It is located in the lower back, constant, sharp, 9 out of 10 in severity, radiating down to both legs. Patient cannot walk normally due to severe pain. this due to degenerative disc disease and spinal stenosis as evidenced by MRI    PT Comments    Continuing work on functional mobility and activity tolerance;  Reports feeling weak after a muscle relaxer today; Able to walk more distance total, with one seated rest break;   Pt did not progress as quickly as I had originally anticipated; she must be modified independent to dc home and manage a flight of steps; she also takes the train home to [place], [place];   Given this, a SNF stay for post-acute rehab to maximize independence and safety with mobility is not unreasonable; She tells me her insurance will kick in on Feb.1, which unfortunately will likely complicate dc planning  Follow Up Recommendations  SNF;Supervision/Assistance - 24 hour     Equipment Recommendations  Rolling walker with 5" wheels;3in1 (PT);Other (comment)(Wide, to accomodate 310 lbs)  We can consider wheelchair as well   Recommendations for Other Services       Precautions / Restrictions Precautions Precautions: Back(for comfort) Precaution Comments: able to recall 2 out 3 back precautions Restrictions Weight Bearing Restrictions: No    Mobility  Bed Mobility Overal bed mobility: Modified Independent             General bed mobility  comments: able to progress to EOB with incr time  Transfers Overall transfer level: Needs assistance Equipment used: Rolling walker (2 wheeled) Transfers: Sit to/from Stand Sit to Stand: Supervision         General transfer comment: Supervision for safety, good hand placement.  Ambulation/Gait Ambulation/Gait assistance: Min guard Ambulation Distance (Feet): 30 Feet(x2) Assistive device: Rolling walker (2 wheeled) Gait Pattern/deviations: Decreased step length - right;Decreased step length - left;Trunk flexed   Gait velocity interpretation: Below normal speed for age/gender General Gait Details: Noting difficulty advancing RLE with noted occasional toe drag, increasing risk of falls; cues to self-monitor for activity tolerance   Stairs            Wheelchair Mobility    Modified Rankin (Stroke Patients Only)       Balance Overall balance assessment: Needs assistance Sitting-balance support: Feet supported Sitting balance-Leahy Scale: Good     Standing balance support: Bilateral upper extremity supported;During functional activity Standing balance-Leahy Scale: Poor Standing balance comment: reports legs feel weak                            Cognition Arousal/Alertness: Awake/alert Behavior During Therapy: WFL for tasks assessed/performed Overall Cognitive Status: Within Functional Limits for tasks assessed                                        Exercises      General Comments General comments (skin integrity, edema, etc.): pt with  edema in bil LE and rolling down socks due to elastic cutting into leg      Pertinent Vitals/Pain Pain Assessment: Faces Pain Score: 9  Faces Pain Scale: Hurts little more Pain Location: back, R hip Pain Descriptors / Indicators: Discomfort;Grimacing Pain Intervention(s): Monitored during session    Home Living                      Prior Function            PT Goals (current goals  can now be found in the care plan section) Acute Rehab PT Goals Patient Stated Goal: less pain PT Goal Formulation: With patient Time For Goal Achievement: 03/27/17 Potential to Achieve Goals: Good Progress towards PT goals: Not progressing toward goals - comment(Feeling weaker this afternoon)    Frequency    Min 5X/week      PT Plan Discharge plan needs to be updated    Co-evaluation              AM-PAC PT "6 Clicks" Daily Activity  Outcome Measure  Difficulty turning over in bed (including adjusting bedclothes, sheets and blankets)?: A Little Difficulty moving from lying on back to sitting on the side of the bed? : A Little Difficulty sitting down on and standing up from a chair with arms (e.g., wheelchair, bedside commode, etc,.)?: A Little Help needed moving to and from a bed to chair (including a wheelchair)?: A Little Help needed walking in hospital room?: A Little Help needed climbing 3-5 steps with a railing? : A Lot 6 Click Score: 17    End of Session Equipment Utilized During Treatment: Gait belt Activity Tolerance: Patient limited by pain Patient left: in chair;with call bell/phone within reach Nurse Communication: Mobility status PT Visit Diagnosis: Pain;Muscle weakness (generalized) (M62.81);Difficulty in walking, not elsewhere classified (R26.2) Pain - Right/Left: Right Pain - part of body: Leg(and back)     Time: 9628-3662 PT Time Calculation (min) (ACUTE ONLY): 19 min  Charges:  $Gait Training: 8-22 mins                    G Codes:       Roney Marion, [place] Pager [phone] Office [place] 03/21/2017, 3:32 PM

## 2017-03-22 ENCOUNTER — Ambulatory Visit (INDEPENDENT_AMBULATORY_CARE_PROVIDER_SITE_OTHER): Payer: Charity | Admitting: Hospitalist

## 2017-03-22 DIAGNOSIS — K3184 Gastroparesis: Secondary | ICD-10-CM

## 2017-03-22 DIAGNOSIS — I10 Essential (primary) hypertension: Secondary | ICD-10-CM

## 2017-03-22 DIAGNOSIS — M5441 Lumbago with sciatica, right side: Secondary | ICD-10-CM

## 2017-03-22 DIAGNOSIS — E785 Hyperlipidemia, unspecified: Secondary | ICD-10-CM

## 2017-03-22 DIAGNOSIS — E876 Hypokalemia: Secondary | ICD-10-CM

## 2017-03-22 DIAGNOSIS — M5442 Lumbago with sciatica, left side: Secondary | ICD-10-CM

## 2017-03-22 DIAGNOSIS — Z794 Long term (current) use of insulin: Secondary | ICD-10-CM

## 2017-03-22 DIAGNOSIS — K219 Gastro-esophageal reflux disease without esophagitis: Secondary | ICD-10-CM

## 2017-03-22 DIAGNOSIS — N182 Chronic kidney disease, stage 2 (mild): Secondary | ICD-10-CM

## 2017-03-22 DIAGNOSIS — M1A9XX0 Chronic gout, unspecified, without tophus (tophi): Secondary | ICD-10-CM

## 2017-03-22 DIAGNOSIS — M549 Dorsalgia, unspecified: Secondary | ICD-10-CM

## 2017-03-22 DIAGNOSIS — F329 Major depressive disorder, single episode, unspecified: Secondary | ICD-10-CM

## 2017-03-22 DIAGNOSIS — E1165 Type 2 diabetes mellitus with hyperglycemia: Secondary | ICD-10-CM

## 2017-03-22 DIAGNOSIS — E1140 Type 2 diabetes mellitus with diabetic neuropathy, unspecified: Secondary | ICD-10-CM

## 2017-03-22 DIAGNOSIS — E1169 Type 2 diabetes mellitus with other specified complication: Secondary | ICD-10-CM

## 2017-03-22 LAB — MAGNESIUM
MAGNESIUM: 1.6 mg/dL — AB (ref 1.7–2.4)
Magnesium: 2 mg/dL (ref 1.7–2.4)

## 2017-03-22 LAB — GLUCOSE, CAPILLARY
GLUCOSE-CAPILLARY: 118 mg/dL — AB (ref 65–99)
Glucose-Capillary: 140 mg/dL — ABNORMAL HIGH (ref 65–99)

## 2017-03-22 LAB — POTASSIUM: Potassium: 3.4 mmol/L — ABNORMAL LOW (ref 3.5–5.1)

## 2017-03-22 LAB — BASIC METABOLIC PANEL
Anion gap: 10 (ref 5–15)
BUN: 19 mg/dL (ref 6–20)
CALCIUM: 8.8 mg/dL — AB (ref 8.9–10.3)
CO2: 26 mmol/L (ref 22–32)
CREATININE: 0.97 mg/dL (ref 0.44–1.00)
Chloride: 102 mmol/L (ref 101–111)
Glucose, Bld: 119 mg/dL — ABNORMAL HIGH (ref 65–99)
Potassium: 3.4 mmol/L — ABNORMAL LOW (ref 3.5–5.1)
SODIUM: 138 mmol/L (ref 135–145)

## 2017-03-22 MED ORDER — DICLOFENAC SODIUM 1 % TD GEL
2.0000 g | Freq: Four times a day (QID) | TRANSDERMAL | Status: DC
  Administered 2017-03-22 (×2): 2 g via TOPICAL
  Filled 2017-03-22: qty 100

## 2017-03-22 MED ORDER — POTASSIUM CHLORIDE CRYS ER 20 MEQ PO TBCR: 40.0000 meq | EXTENDED_RELEASE_TABLET | Freq: Once | ORAL | Status: DC

## 2017-03-22 MED ORDER — OXYCODONE HCL 5 MG PO TABS: 5.0000 mg | ORAL_TABLET | ORAL | 0 refills | Status: DC | PRN

## 2017-03-22 MED ORDER — POTASSIUM CHLORIDE ER 10 MEQ PO TBCR: 20.0000 meq | EXTENDED_RELEASE_TABLET | Freq: Every day | ORAL | 0 refills | Status: DC

## 2017-03-22 MED ORDER — MAGNESIUM SULFATE 4 GM/100ML IV SOLN
4.0000 g | Freq: Once | INTRAVENOUS | Status: AC
  Administered 2017-03-22: 4 g via INTRAVENOUS
  Filled 2017-03-22: qty 100

## 2017-03-22 MED ORDER — DICLOFENAC SODIUM 1 % TD GEL: 2.0000 g | Freq: Four times a day (QID) | TRANSDERMAL | 0 refills | Status: DC

## 2017-03-22 MED ORDER — HYDROMORPHONE HCL 1 MG/ML IJ SOLN
1.0000 mg | Freq: Four times a day (QID) | INTRAMUSCULAR | Status: AC | PRN
  Administered 2017-03-22: 1 mg via INTRAVENOUS
  Filled 2017-03-22: qty 1

## 2017-03-22 MED ORDER — POTASSIUM CHLORIDE CRYS ER 20 MEQ PO TBCR
40.0000 meq | EXTENDED_RELEASE_TABLET | Freq: Two times a day (BID) | ORAL | Status: DC
  Administered 2017-03-22: 40 meq via ORAL
  Filled 2017-03-22: qty 2

## 2017-03-22 NOTE — Clinical Social Work Note (Signed)
Clinical Social Work Assessment  Patient Details  Name: Deborah [name] MRN: [id number] Date of Birth: 19-Jul-1965  Date of referral:  03/22/17               Reason for consult:  Facility Placement                Permission sought to share information with:  Chartered certified accountant granted to share information::  Yes, Verbal Permission Granted  Name::        Agency::  [facility]NF-[facility]  Relationship::     Contact Information:     Housing/Transportation Living arrangements for the past 2 months:  Apartment Source of Information:  Patient Patient Interpreter Needed:  None Criminal Activity/Legal Involvement Pertinent to Current Situation/Hospitalization:  No - Comment as needed Significant Relationships:  Other Family Members, Parents, Spouse, Friend, Siblings Lives with:  Siblings Do you feel safe going back to the place where you live?  Yes Need for family participation in patient care:  No (Coment)  Care giving concerns:  Pt has longstanding back issues and used a cane for ambulation prior to hospitalization. She is unemployed and has been trying to get assistance. Pt receives food stamps. Pt resides with brother and sister-n-law in his townhouse. Pt able to independently feed herself, bath and get dressed. She struggles with ambulation and desires to get better. Pt hopes to get back to work at some point when she is better.   CSW explained the insurance barrier and following to see if we can get LOG. Pt understood and appreciated CSW efforts. CSW explained to patient that if we cannot get approved for short term rehab, then she will have to go home. Pt in agreement with same.  Social Worker assessment / plan:  CSW will f/u with supervisor on LOG.  Employment status:  Unemployed Forensic scientist:  Self Pay (Medicaid Pending) PT Recommendations:  [place] / Referral to community resources:  [place]  Patient/Family's  Response to care:  Pt voiced appreciation for CSW coming to meet and attempting to assist with SNF placement.  Patient/Family's Understanding of and Emotional Response to Diagnosis, Current Treatment, and Prognosis:  Pt has good understanding of physical limitations and barrier to insurance. CSW explained to patient needing approval for SNF placement as patient is uninsured.  CSw discussed with patient that if we cannot approval for SNF-[facility], then patient will have to go home. CSW will f/u.  No other issues or concerns identified.  Emotional Assessment Appearance:  Appears older than stated age Attitude/Demeanor/Rapport:  (Cooperative) Affect (typically observed):  Accepting, Appropriate Orientation:  Oriented to Situation, Oriented to  Time, Oriented to Place, Oriented to Self Alcohol / Substance use:  Not Applicable Psych involvement (Current and /or in the community):  No (Comment)  Discharge Needs  Concerns to be addressed:  Discharge Planning Concerns Readmission within the last 30 days:  Yes Current discharge risk:  Dependent with Mobility, Physical Impairment, Inadequate Financial Supports Barriers to Discharge:  No Barriers Identified   Deborah Baxter, LCSW 03/22/2017, 12:57 PM

## 2017-03-22 NOTE — Discharge Summary (Signed)
Physician Discharge Summary  Deborah [name] TFT:[id number] DOB: 12-Dec-1965 DOA: 03/19/2017  PCP: Patient, No Pcp Per  Admit date: 03/19/2017 Discharge date: 03/22/2017  Time spent: 45 minutes  Recommendations for Outpatient Follow-up:  Patient will be discharged to home.  Patient will need to follow up with primary care provider within one week of discharge, repeat BMP.  Follow up with neurosurgery. Patient should continue medications as prescribed.  Patient should follow a heart healthy/carb modified diet.   Discharge Diagnoses:  Back pain Essential hypertension Diabetes mellitus, type II Dyslipidemia Hypokalemia Hypomagnesemia  Gout Gastroparesis History of CVA GERD Depression [name] Morbidly obese  Discharge Condition: Stable  Diet recommendation: heart healthy/carb modified   Filed Weights   03/19/17 0951  Weight: (!) 140.6 kg (310 lb)    History of present illness:  On 03/19/2017 by Dr. Consuello Bossier [name] a 52 y.o.femalewith medical history significant ofmorbid obesity, hypertension, hyperlipidemia, diabetes mellitus, stroke, GERD, gout, depression, gastric ulcer, gastroparesis, fibromyalgia, CK-2, who presents with back pain.  Patient states that she has been having lower back pain for more than one week, which has been progressively getting worse. It is located in the lower back, constant, sharp, 9 out of 10 in severity, radiating down to both legs. Patient cannot walk normally due to severe pain. He has leg weakness, but no numbness or tingliness.No urinary incontinenceor loss ofcontrol for bowel movement. No fever or chills.Patient states that she has intermittent mildSOB, but no chest pain, cough. Patient states that she had one loose stool today, and I feel nauseous, but no vomiting or diarrhea. Denies symptoms of UTI.  Hospital Course:  Back pain -Has Been Ongoing for Approximately One Half Months However over the Last  Week Has Worsened. Patient States She Is Unable to Move Her Legs. -MRI obtained showing mild lumbar spine dextrocurvature and grade 1 L4-5 anterior listhesis. Mild L4-5 facet edema, likely degenerative. Edema within bilateral L5 and right L4 pedicles. Lumbar spondylosis greatest at L4-5, L5-S1 levels. A moderate to severe bilateral L4-5 and severe right mild left L5-S1 foraminal stenosis. Moderate to severe L4-5 canal stenosis. -Neurosurgery consulted and appreciated-does not require any emergent neurosurgery intervention, patient will follow neurosurgery in [place] where she is from. -Continue pain control- continues to receive IV dilaudid- 3 doses given on 03/21/2017. Advised patient to use more oral pain control as IV will not be available to her on discharge. Have discussed with patient again and have advised her to use oral more. Will discontinue IV dilaudid today.  -Added on voltaren gel and continue k-pad -Dicussed this with patient, she feels she cannot walk or stand on her own and would like to stay in the hospital for continued pain control and physical therapy. She fears she cannot ride the train back to [place].  -Case management consulted for D/C planning -Social work consulted -PT orginially recommended outpatient PT and now recommending SNF- however could not place patient. -PT recommended 3in1 and rolling walker -will discharge patient with limited number of oxycodone  Essential hypertension -continue amlodipine, metoprolol  Diabetes mellitus, type II -continue Insulin 70/30  Dyslipidemia -Continue statin  Hypokalemia -potassium 3.4 after supplementation -will discharge patient with potassium supplementation -repeat BMP in one week  Hypomagnesemia  -Magnesium 2 -continue oral supplementation  Gout -stable, continue allopurinol  Gastroparesis -Continue antiemetics as needed, home Reglan  History of CVA -Patient with allergy to aspirin, continue  statin  GERD -Continue PPI  Depression -Continue Cymbalta  [name] -Resolved,  currently not having further shortness of breath -Chest x-ray unremarkable for infection -Continue nebulizers as needed -lungs are clear on exam -incentive spirometry ordered  Morbidly obese -BMI >50 -Patient will need to follow-up with her PCP to discuss last modifications and/or other interventions.  Procedures: None  Consultations: Neurosurgery   Discharge Exam: Vitals:   03/22/17 0632 03/22/17 1300  BP: 122/63 111/71  Pulse: 91 100  Resp: 17 17  Temp: 98 F (36.7 C) 98.7 F (37.1 C)  SpO2: 93% 100%     General: Well developed, well nourished, NAD, appears stated age  HEENT: NCAT, mucous membranes moist.  Cardiovascular: S1 S2 auscultated, RRR, no murmurs  Respiratory: Clear to auscultation bilaterally with equal chest rise  Abdomen: Soft, obese, nontender, nondistended, + bowel sounds  Extremities: warm dry without cyanosis clubbing or edema  Neuro: AAOx3, nonfocal  Psych: Appropriate mood and affect  Discharge Instructions Discharge Instructions    Discharge instructions   Complete by:  As directed    Patient will be discharged to home.  Patient will need to follow up with primary care provider within one week of discharge, repeat BMP.  Follow up with neurosurgery. Patient should continue medications as prescribed.  Patient should follow a heart healthy/carb modified diet.     Allergies as of 03/22/2017      Reactions   Lisinopril Anaphylaxis   Tongue and mouth swelling   Penicillins Palpitations   Has patient had a PCN reaction causing immediate rash, facial/tongue/throat swelling, SOB or lightheadedness with hypotension: Yes, heart races Has patient had a PCN reaction causing severe rash involving mucus membranes or skin necrosis: No Has patient had a PCN reaction that required hospitalization: Yes  Has patient had a PCN reaction occurring within  the last 10 years: No   Valium [diazepam] [name]   Aspirin Other (See Comments)   Irritates stomach ulcer    Food Hives, Swelling   Carrots, ketchup    Nsaids Other (See Comments)   ULCER   Tylenol [acetaminophen] Other (See Comments)   Irritates stomach ulcer    Tramadol Nausea And Vomiting      Medication List    STOP taking these medications   benzonatate 100 MG capsule Commonly known as:  TESSALON   methylPREDNISolone 4 MG Tbpk tablet Commonly known as:  MEDROL DOSEPAK     TAKE these medications   albuterol 108 (90 Base) MCG/ACT inhaler Commonly known as:  PROVENTIL HFA;VENTOLIN HFA Inhale 2 puffs into the lungs every 6 (six) hours as needed for wheezing or shortness of breath.   allopurinol 100 MG tablet Commonly known as:  ZYLOPRIM Take 1 tablet (100 mg total) by mouth daily.   amLODipine 10 MG tablet Commonly known as:  NORVASC Take 1 tablet (10 mg total) by mouth daily.   atorvastatin 20 MG tablet Commonly known as:  LIPITOR Take 20 mg by mouth at bedtime.   diclofenac sodium 1 % Gel Commonly known as:  VOLTAREN Apply 2 g topically 4 (four) times daily.   DULoxetine 30 MG capsule Commonly known as:  CYMBALTA Take 1 capsule (30 mg total) by mouth 2 (two) times daily.   Magnesium Oxide 400 (240 Mg) MG Tabs Take 1 tablet (400 mg total) by mouth daily at 2 PM.   metoCLOPramide 10 MG tablet Commonly known as:  REGLAN Take 1 tablet (10 mg total) by mouth 3 (three) times daily before meals.   metoprolol succinate 25 MG 24 hr tablet Commonly known as:  TOPROL-XL Take 1 tablet (25 mg total) by mouth daily.   nitroGLYCERIN 0.4 MG SL tablet Commonly known as:  NITROSTAT Place 1 tablet (0.4 mg total) under the tongue every 5 (five) minutes as needed for chest pain.   NOVOLOG MIX 70/30 (70-30) 100 UNIT/ML injection Generic drug:  insulin aspart protamine- aspart 75 units in am, 60 units  lunch, and 50 units pm What changed:    how much to  take  how to take this  when to take this  additional instructions   oxyCODONE 5 MG immediate release tablet Commonly known as:  Oxy IR/ROXICODONE Take 1 tablet (5 mg total) by mouth every 4 (four) hours as needed for moderate pain.   pantoprazole 40 MG tablet Commonly known as:  PROTONIX Take 1 tablet (40 mg total) by mouth 2 (two) times daily.   potassium chloride 10 MEQ tablet Commonly known as:  K-DUR Take 2 tablets (20 mEq total) by mouth daily for 7 days.   sucralfate 1 g tablet Commonly known as:  CARAFATE Take 1 tablet (1 g total) by mouth 4 (four) times daily -  with meals and at bedtime.            Durable Medical Equipment  (From admission, onward)        Start     Ordered   03/22/17 1315  For home use only DME Walker rolling  Once    Question:  Patient needs a walker to treat with the following condition  Answer:  Pain   03/22/17 1315   03/22/17 1315  For home use only DME 3 n 1  Once     03/22/17 1315     Allergies  Allergen Reactions  . Lisinopril Anaphylaxis    Tongue and mouth swelling  . Penicillins Palpitations    Has patient had a PCN reaction causing immediate rash, facial/tongue/throat swelling, SOB or lightheadedness with hypotension: Yes, heart races Has patient had a PCN reaction causing severe rash involving mucus membranes or skin necrosis: No Has patient had a PCN reaction that required hospitalization: Yes  Has patient had a PCN reaction occurring within the last 10 years: No   . Valium [Diazepam] [name]  . Aspirin Other (See Comments)    Irritates stomach ulcer   . Food Hives and Swelling    Carrots, ketchup   . Nsaids Other (See Comments)    ULCER  . Tylenol [Acetaminophen] Other (See Comments)    Irritates stomach ulcer   . Tramadol Nausea And Vomiting      The results of significant diagnostics from this hospitalization (including imaging, microbiology, ancillary and laboratory) are listed below for  reference.    Significant Diagnostic Studies: Dg Chest 2 View  Result Date: 03/19/2017 CLINICAL DATA:  Bilateral leg pain for 1 week. EXAM: CHEST  2 VIEW COMPARISON:  August 22, 2016 FINDINGS: The heart size and mediastinal contours are within normal limits. Both lungs are clear. The visualized skeletal structures are unremarkable. IMPRESSION: No active cardiopulmonary disease. Electronically Signed   By: Dorise Bullion III M.D   On: 03/19/2017 10:57   Dg Lumbar Spine Complete  Result Date: 03/19/2017 CLINICAL DATA:  Bilateral leg pain for 1 week. EXAM: LUMBAR SPINE - COMPLETE 4+ VIEW COMPARISON:  None. FINDINGS: Grade 1 anterolisthesis of L4 versus L5. No other malalignment. No fractures identified. Mild lower lumbar facet degenerative changes. No other acute abnormalities. IMPRESSION: Grade 1 anterolisthesis of L4 versus L5. Mild lower lumbar facet  degenerative changes. Electronically Signed   By: Dorise Bullion III M.D   On: 03/19/2017 10:56   Ct Head Wo Contrast  Result Date: 03/19/2017 CLINICAL DATA:  Lower extremity weakness. EXAM: CT HEAD WITHOUT CONTRAST TECHNIQUE: Contiguous axial images were obtained from the base of the skull through the vertex without intravenous contrast. COMPARISON:  None. FINDINGS: Brain: No acute intracranial abnormality. Specifically, no hemorrhage, hydrocephalus, mass lesion, acute infarction, or significant intracranial injury. Vascular: No hyperdense vessel or unexpected calcification. Skull: No acute calvarial abnormality. Sinuses/Orbits: Visualized paranasal sinuses and mastoids clear. Orbital soft tissues unremarkable. Other: None IMPRESSION: No intracranial abnormality. Electronically Signed   By: Rolm Baptise M.D.   On: 03/19/2017 15:52   Mr Lumbar Spine Wo Contrast  Result Date: 03/19/2017 CLINICAL DATA:  52 y/o F; lower back pain and weakness down both legs for 1 week. EXAM: MRI LUMBAR SPINE WITHOUT CONTRAST TECHNIQUE: Multiplanar, multisequence MR imaging  of the lumbar spine was performed. No intravenous contrast was administered. COMPARISON:  07/04/2016 CT abdomen and pelvis FINDINGS: Segmentation:  Standard. Alignment: Mild dextrocurvature with apex at L3. Grade 1 L4-5 anterolisthesis. Vertebrae: Mild edema surrounding the L4-5 facets and trace facet effusions, likely degenerative. Edema within the bilateral L5 and right L4 pedicles without appreciable fracture, probable stress reaction. No evidence for discitis. No loss of vertebral body height. Conus medullaris and cauda equina: Conus extends to the L1 level. Conus and cauda equina appear normal. Paraspinal and other soft tissues: Negative. Disc levels: L1-2: No significant disc displacement, foraminal stenosis, or canal stenosis. L2-3: No significant disc displacement, foraminal stenosis, or canal stenosis. L3-4: Minimal disc bulge and mild facet hypertrophy. No significant foraminal or canal stenosis. L4-5: 1 anterolisthesis with small uncovered disc bulge and severe facet hypertrophy. Moderate to severe bilateral foraminal stenosis. Moderate to severe canal stenosis. Lateral recess effacement. L5-S1: Small disc bulge, endplate marginal osteophytes, small central disc protrusion, and mild facet hypertrophy. Severe right foraminal and mild left foraminal stenosis. No significant canal stenosis. IMPRESSION: 1. Mild lumbar spine dextrocurvature and grade 1 L4-5 anterolisthesis. 2. Mild L4-5 facet edema, likely degenerative. 3. Edema within bilateral L5 and right L4 pedicles without appreciable fracture, probably stress reaction. 4. Lumbar spondylosis greatest at L4-5 and L5-S1 levels. 5. Moderate to severe bilateral L4-5 and severe right mild left L5-S1 foraminal stenosis. 6. Moderate to severe L4-5 canal stenosis. Electronically Signed   By: Kristine Garbe M.D.   On: 03/19/2017 19:48    Microbiology: No results found for this or any previous visit (from the past 240 hour(s)).   Labs: Basic  Metabolic Panel: Recent Labs  Lab 03/19/17 1056 03/20/17 0714 03/21/17 0742 03/22/17 0644 03/22/17 1424  NA 138 138 140 138  --   K 3.1* 3.8 2.9* 3.4* 3.4*  CL 103 102 104 102  --   CO2 26 26 27 26   --   GLUCOSE 235* 350* 155* 119*  --   BUN 15 15 17 19   --   CREATININE 1.04* 1.03* 0.97 0.97  --   CALCIUM 9.1 8.9 8.7* 8.8*  --   MG  --  1.5* 1.9 1.6* 2.0   Liver Function Tests: Recent Labs  Lab 03/19/17 1056  AST 26  ALT 17  ALKPHOS 80  BILITOT 1.0  PROT 7.0  ALBUMIN 3.3*   No results for input(s): LIPASE, AMYLASE in the last 168 hours. No results for input(s): AMMONIA in the last 168 hours. CBC: Recent Labs  Lab 03/19/17 1056 03/20/17 0714  WBC 10.4 8.7  NEUTROABS 5.8  --   HGB 10.1* 9.6*  HCT 30.9* 31.0*  MCV 89.0 89.6  PLT 388 387   Cardiac Enzymes: No results for input(s): CKTOTAL, CKMB, CKMBINDEX, TROPONINI in the last 168 hours. BNP: BNP (last 3 results) Recent Labs    08/15/16 2229 03/19/17 1056  BNP 25.4 47.7    ProBNP (last 3 results) No results for input(s): PROBNP in the last 8760 hours.  CBG: Recent Labs  Lab 03/21/17 1133 03/21/17 1612 03/21/17 2143 03/22/17 0624 03/22/17 1134  GLUCAP 91 217* 239* 118* 140*       Signed:  Lue [place]  [facility] 03/22/2017, 3:38 PM

## 2017-03-22 NOTE — Plan of Care (Signed)
  Progressing Education: Knowledge of General Education information will improve 03/22/2017 1048 - Progressing by Harlin Heys, RN Health Behavior/Discharge Planning: Ability to manage health-related needs will improve 03/22/2017 1048 - Progressing by Harlin Heys, RN Clinical Measurements: Ability to maintain clinical measurements within normal limits will improve 03/22/2017 1048 - Progressing by Harlin Heys, RN Will remain free from infection 03/22/2017 1048 - Progressing by Harlin Heys, RN Diagnostic test results will improve 03/22/2017 1048 - Progressing by Harlin Heys, RN Respiratory complications will improve 03/22/2017 1048 - Progressing by Harlin Heys, RN Cardiovascular complication will be avoided 03/22/2017 1048 - Progressing by Harlin Heys, RN Activity: Risk for activity intolerance will decrease 03/22/2017 1048 - Progressing by Harlin Heys, RN Nutrition: Adequate nutrition will be maintained 03/22/2017 1048 - Progressing by Harlin Heys, RN Coping: Level of anxiety will decrease 03/22/2017 1048 - Progressing by Harlin Heys, RN Elimination: Will not experience complications related to bowel motility 03/22/2017 1048 - Progressing by Harlin Heys, RN Will not experience complications related to urinary retention 03/22/2017 1048 - Progressing by Harlin Heys, RN Pain Managment: General experience of comfort will improve 03/22/2017 1048 - Progressing by Harlin Heys, RN Safety: Ability to remain free from injury will improve 03/22/2017 1048 - Progressing by Harlin Heys, RN Skin Integrity: Risk for impaired skin integrity will decrease 03/22/2017 1048 - Progressing by Harlin Heys, RN

## 2017-03-22 NOTE — Social Work (Signed)
CSW discussed case with Surveyor, quantity of SW, Z.Brooks and d he declined to approved SNF-LOG bed.  CSW will advise Dr. Ree Kida and clinical staff as patient will need to transition home.  Elissa Hefty, LCSW Clinical Social Worker [phone]

## 2017-03-22 NOTE — Progress Notes (Signed)
Physical Therapy Treatment Patient Details Name: Deborah [name] MRN: [id number] DOB: May 14, 1965 Today's Date: 03/22/2017    History of Present Illness Deborah [name] is a 52 y.o. female with medical history significant of morbid obesity, hypertension, hyperlipidemia, diabetes mellitus, stroke, GERD, gout, depression, gastric ulcer, gastroparesis, fibromyalgia, CK-2, who presents with back pain. Patient states that she has been having lower back pain for more than one week, which has been progressively getting worse. It is located in the lower back, constant, sharp, 9 out of 10 in severity, radiating down to both legs. Patient cannot walk normally due to severe pain. this due to degenerative disc disease and spinal stenosis as evidenced by MRI    PT Comments    Continuing work on functional mobility and activity tolerance;  Session focus on progressive ambulation; Difficulty walking, decr heel strike R and L (R with less heel strike than L); 4-5 standing rest breaks with elbows propped on RW;   Discussed DC planning and possibilities; Deborah [name] is very interested in SNF for rehab to maximize independence and safety with mobility before going home; Given her functional status, this is not entirely unreasonable, but without insurance, it a long shot at best; I shared my concern with her that without insurance, it will be very difficult to place her in a SNF for rehab; Encouraged her to look closely into options for staying with someone (Brother and Deborah [name], Mother, perhaps a Friend here in [place]) until she feels strong enough to get home and look into Outpt PT and Neurosurgery follow up; Maybe pursue HHPT or Outpt PT here if she has a place to stay  Follow Up Recommendations  SNF;Supervision/Assistance - 24 hour;Other (comment)(See discussion above)     Equipment Recommendations  Rolling walker with 5" wheels;3in1 (PT);Other (comment)(Wide, to accomodate 310 lbs)     Recommendations for Other Services       Precautions / Restrictions Precautions Precautions: Back(for comfort) Precaution Comments: able to recall 2 out 3 back precautions Restrictions Weight Bearing Restrictions: No    Mobility  Bed Mobility               General bed mobility comments: In recliner upon arrival  Transfers Overall transfer level: Needs assistance Equipment used: Rolling walker (2 wheeled) Transfers: Sit to/from Stand Sit to Stand: Supervision         General transfer comment: Supervision for safety, good hand placement.  Ambulation/Gait Ambulation/Gait assistance: Min guard Ambulation Distance (Feet): 35 Feet(x2) Assistive device: Rolling walker (2 wheeled) Gait Pattern/deviations: Decreased step length - right;Decreased step length - left;Trunk flexed;Decreased dorsiflexion - right     General Gait Details: Noting difficulty advancing RLE with noted occasional toe drag and decr heel strike, increasing risk of falls; cues to self-monitor for activity tolerance; 4-5 standing rest breaks in which Deborah [name] leaned over and elbow propped on RW   Stairs            Wheelchair Mobility    Modified Rankin (Stroke Patients Only)       Balance     Sitting balance-Leahy Scale: Good       Standing balance-Leahy Scale: Poor Standing balance comment: reports legs feel weak                            Cognition Arousal/Alertness: Awake/alert Behavior During Therapy: WFL for tasks assessed/performed Overall Cognitive Status: Within Functional Limits for tasks assessed  Exercises      General Comments        Pertinent Vitals/Pain Pain Assessment: Faces Faces Pain Scale: Hurts worst Pain Location: back, R hip, shooting pain down RLE; crying Pain Descriptors / Indicators: Grimacing;Crying Pain Intervention(s): Monitored during session;Premedicated before session     Home Living                      Prior Function            PT Goals (current goals can now be found in the care plan section) Acute Rehab PT Goals Patient Stated Goal: less pain PT Goal Formulation: With patient Time For Goal Achievement: 03/27/17 Potential to Achieve Goals: Good Progress towards PT goals: Progressing toward goals(slowly)    Frequency    Min 5X/week      PT Plan Current plan remains appropriate    Co-evaluation              AM-PAC PT "6 Clicks" Daily Activity  Outcome Measure  Difficulty turning over in bed (including adjusting bedclothes, sheets and blankets)?: A Little Difficulty moving from lying on back to sitting on the side of the bed? : A Little Difficulty sitting down on and standing up from a chair with arms (e.g., wheelchair, bedside commode, etc,.)?: A Little Help needed moving to and from a bed to chair (including a wheelchair)?: A Little Help needed walking in hospital room?: A Little Help needed climbing 3-5 steps with a railing? : A Lot 6 Click Score: 17    End of Session   Activity Tolerance: Patient limited by pain Patient left: in chair;with call bell/phone within reach Nurse Communication: Mobility status;Other (comment)(brainstorming DC options) PT Visit Diagnosis: Pain;Muscle weakness (generalized) (M62.81);Difficulty in walking, not elsewhere classified (R26.2) Pain - Right/Left: Right Pain - part of body: Leg(and back)     Time: 7078-6754 PT Time Calculation (min) (ACUTE ONLY): 25 min  Charges:  $Gait Training: 8-22 mins $Therapeutic Activity: 8-22 mins                    G Codes:       Roney Marion, [place] Pager [phone] Office [place] 03/22/2017, 11:59 AM

## 2017-03-22 NOTE — Social Work (Signed)
CSW discussed with RN on floor and advised that patient will not be able to got SNF as it was not approved.    CSW advised Dr. Ree Kida of same.  CSW then spoke with patient and advised that she was not approved for SNF LOG placement and would have to go home at discharge.   Elissa Hefty, LCSW Clinical Social Worker [phone]

## 2017-03-22 NOTE — Social Work (Addendum)
Pt has made transportation arrangement home with family, therefore no taxi cab voucher needed at this time.  CSW will sign off for now as social work intervention is no longer needed. Please consult [place] again if new need arises.  Elissa Hefty, LCSW Clinical Social Worker [phone]

## 2017-03-22 NOTE — NC FL2 (Signed)
[place] MEDICAID FL2 LEVEL OF CARE SCREENING TOOL     IDENTIFICATION  Patient Name: Deborah [name] Birthdate: 11/28/1965 Sex: female Admission Date (Current Location): 03/19/2017  [facility] and [place] Number:  Herbalist and Address:  [facility], [place] [address], [place], [place] [address]      Provider Number: [id number]  Attending Physician Name and Address:  Cristal Ford, DO  Relative Name and Phone Number:  Haniyyah Sakuma, mom, [phone]    Current Level of Care: Hospital Recommended Level of Care: [place] Prior Approval Number:    Date Approved/Denied:   PASRR Number: [id number]  Discharge Plan: SNF    Current Diagnoses: Patient Active Problem List   Diagnosis Date Noted  . Back pain 03/19/2017  . Stroke (cerebrum) ([place]) 03/19/2017  . GERD (gastroesophageal reflux disease) 03/19/2017  . Depression 03/19/2017  . Obesity   . Urinary tract infection 08/16/2016  . Anemia 08/16/2016  . Gastroparesis 08/16/2016  . Intractable nausea and vomiting 06/17/2016  . Diabetic gastroparesis ([place]) 06/05/2016  . Gout 06/05/2016  . AKI (acute kidney injury) ([place]) 12/06/2015  . Hypokalemia 09/26/2015  . Hypomagnesemia 09/26/2015  . Nausea and vomiting 08/20/2015  . Gout flare 05/27/2015  . Abdominal pain 05/26/2015  . DKA (diabetic ketoacidoses) ([place]) 05/25/2015  . Uncontrolled type 2 diabetes mellitus with diabetic neuropathy, with long-term current use of insulin ([place]) 05/25/2015  . Dyslipidemia associated with type 2 diabetes mellitus ([place]) 05/25/2015  . CKD (chronic kidney disease), stage II 05/25/2015  . Essential hypertension, benign 09/28/2013    Orientation RESPIRATION BLADDER Height & Weight     Self, Time, Situation  Normal Continent Weight: (!) 310 lb (140.6 kg) Height:  5\' 6"  (167.6 cm)  BEHAVIORAL SYMPTOMS/MOOD NEUROLOGICAL BOWEL NUTRITION STATUS      Continent Diet(See DC Summary)   AMBULATORY STATUS COMMUNICATION OF NEEDS Skin   Limited Assist Verbally Normal(Back Pain)                       Personal Care Assistance Level of Assistance  Dressing, Bathing, Feeding Bathing Assistance: Limited assistance Feeding assistance: Independent Dressing Assistance: Limited assistance     Functional Limitations Info  Sight, Hearing, Speech Sight Info: Adequate Hearing Info: Adequate Speech Info: Adequate    SPECIAL CARE FACTORS FREQUENCY  PT (By licensed PT), OT (By licensed OT)     PT Frequency: 5x week OT Frequency: 5x week            Contractures      Additional Factors Info  Code Status, Allergies, Insulin Sliding Scale, Psychotropic Code Status Info: Full Allergies Info: LISINOPRIL, PENICILLINS, VALIUM DIAZEPAM, ASPIRIN, FOOD, NSAIDS, TYLENOL ACETAMINOPHEN, TRAMADOL  Psychotropic Info: Cymbalta Insulin Sliding Scale Info: Insulin Daily       Current Medications (03/22/2017):  This is the current hospital active medication list Current Facility-Administered Medications  Medication Dose Route Frequency Provider Last Rate Last Dose  . albuterol (PROVENTIL) (2.5 MG/3ML) 0.083% nebulizer solution 2.5 mg  2.5 mg Nebulization Q6H PRN Ivor Costa, MD      . allopurinol (ZYLOPRIM) tablet 100 mg  100 mg Oral Daily Ivor Costa, MD   100 mg at 03/22/17 0945  . amLODipine (NORVASC) tablet 10 mg  10 mg Oral Daily Ivor Costa, MD   10 mg at 03/22/17 0945  . atorvastatin (LIPITOR) tablet 20 mg  20 mg Oral QHS Ivor Costa, MD   20 mg at 03/21/17 2158  .  diclofenac sodium (VOLTAREN) 1 % transdermal gel 2 g  2 g Topical QID Cristal Ford, DO      . DULoxetine (CYMBALTA) DR capsule 30 mg  30 mg Oral BID Ivor Costa, MD   30 mg at 03/22/17 0945  . enoxaparin (LOVENOX) injection 40 mg  40 mg Subcutaneous Q24H Ivor Costa, MD   40 mg at 03/22/17 0945  . hydrALAZINE (APRESOLINE) injection 5 mg  5 mg Intravenous Q2H PRN Ivor Costa, MD      . HYDROmorphone (DILAUDID)  injection 1 mg  1 mg Intravenous Q6H PRN Mikhail, Velta Addison, DO      . insulin aspart (novoLOG) injection 0-5 Units  0-5 Units Subcutaneous QHS Ivor Costa, MD   2 Units at 03/21/17 2158  . insulin aspart (novoLOG) injection 0-9 Units  0-9 Units Subcutaneous TID WC Ivor Costa, MD   3 Units at 03/21/17 1623  . insulin aspart protamine- aspart (NOVOLOG MIX 70/30) injection 60 Units  60 Units Subcutaneous Q breakfast Ivor Costa, MD   60 Units at 03/22/17 0946   And  . insulin aspart protamine- aspart (NOVOLOG MIX 70/30) injection 45 Units  45 Units Subcutaneous Q lunch Ivor Costa, MD   45 Units at 03/20/17 1220   And  . insulin aspart protamine- aspart (NOVOLOG MIX 70/30) injection 35 Units  35 Units Subcutaneous Q supper Ivor Costa, MD   35 Units at 03/21/17 1623  . metoCLOPramide (REGLAN) tablet 10 mg  10 mg Oral TID Renella Cunas, MD   10 mg at 03/22/17 0945  . metoprolol succinate (TOPROL-XL) 24 hr tablet 25 mg  25 mg Oral Daily Ivor Costa, MD   25 mg at 03/21/17 2158  . nitroGLYCERIN (NITROSTAT) SL tablet 0.4 mg  0.4 mg Sublingual Q5 min PRN Ivor Costa, MD      . ondansetron [facility]) injection 4 mg  4 mg Intravenous Q8H PRN Ivor Costa, MD      . oxyCODONE (Oxy IR/ROXICODONE) immediate release tablet 5 mg  5 mg Oral Q4H PRN Ivor Costa, MD   5 mg at 03/22/17 0945  . pantoprazole (PROTONIX) EC tablet 40 mg  40 mg Oral BID Ivor Costa, MD   40 mg at 03/22/17 0945  . polyethylene glycol (MIRALAX / GLYCOLAX) packet 17 g  17 g Oral Daily PRN Ivor Costa, MD      . prednisoLONE tablet 20 mg  20 mg Oral Daily Ivor Costa, MD   20 mg at 03/22/17 0947  . sucralfate (CARAFATE) tablet 1 g  1 g Oral TID WC & HS Ivor Costa, MD   1 g at 03/22/17 0945  . zolpidem (AMBIEN) tablet 5 mg  5 mg Oral QHS PRN Ivor Costa, MD   5 mg at 03/20/17 2159     Discharge Medications: Please see discharge summary for a list of discharge medications.  Relevant Imaging Results:  Relevant Lab Results:   Additional  Information SS#: [id number]  Normajean Baxter, LCSW

## 2017-03-22 NOTE — Progress Notes (Signed)
PROGRESS NOTE    Deborah [name]  DGU:[id number] DOB: Apr 24, 1965 DOA: 03/19/2017 PCP: Patient, No Pcp Per   Chief Complaint  Patient presents with  . Leg Swelling    Brief Narrative:  HPI On 03/19/2017 by Dr. Ivor Costa Deborah [name] is a 52 y.o. female with medical history significant of morbid obesity, hypertension, hyperlipidemia, diabetes mellitus, stroke, GERD, gout, depression, gastric ulcer, gastroparesis, fibromyalgia, CK-2, who presents with back pain.  Patient states that she has been having lower back pain for more than one week, which has been progressively getting worse. It is located in the lower back, constant, sharp, 9 out of 10 in severity, radiating down to both legs. Patient cannot walk normally due to severe pain. He has leg weakness, but no numbness or tingliness. No urinary incontinence or loss of control for bowel movement. No fever or chills. Patient states that she has intermittent mild SOB, but no chest pain, cough. Patient states that she had one loose stool today, and I feel nauseous, but no vomiting or diarrhea. Denies symptoms of UTI.  Interim history Neurosurgery consulted and recommended outpatient follow up and PT. PT recommended SNF. Social work consulted, however patient has no insurance.  Assessment & Plan   Back pain -Has Been Ongoing for Approximately One Half Months However over the Last Week Has Worsened. Patient States She Is Unable to Move Her Legs. -MRI obtained showing mild lumbar spine dextrocurvature and grade 1 L4-5 anterior listhesis. Mild L4-5 facet edema, likely degenerative. Edema within bilateral L5 and right L4 pedicles. Lumbar spondylosis greatest at L4-5, L5-S1 levels. A moderate to severe bilateral L4-5 and severe right mild left L5-S1 foraminal stenosis. Moderate to severe L4-5 canal stenosis. -Neurosurgery consulted and appreciated-does not require any emergent neurosurgery intervention, patient will follow neurosurgery in  [place] where she is from. -Continue pain control- continues to receive IV dilaudid- 3 doses given on 03/21/2017. Advised patient to use more oral pain control as IV will not be available to her on discharge. Have discussed with patient again and have advised her to use oral more. Will discontinue IV dilaudid today.  -Added on voltaren gel and continue k-pad -Dicussed this with patient, she feels she cannot walk or stand on her own and would like to stay in the hospital for continued pain control and physical therapy. She fears she cannot ride the train back to [place].  -Case management consulted for D/C planning -Social work consulted -PT orginially recommended outpatient PT and now recommending SNF  Essential hypertension -continue amlodipine, metoprolol, hydralazine as needed  Diabetes mellitus, type II -continue Insulin 70/30, sliding scale, and CBG monitor   Dyslipidemia -Continue statin  Hypokalemia -potassium 3.4 today, continue to replace -continue to monitor BMP  Hypomagnesemia  -Magnesium 1.6, will replace and continue to monitor  Gout -stable, continue allopurinol  Gastroparesis -Continue antiemetics as needed, home Reglan  History of CVA -Patient with allergy to aspirin, continue statin  GERD -Continue PPI  Depression -Continue Cymbalta  [name] -Resolved, currently not having further shortness of breath -Chest x-ray unremarkable for infection -Continue nebulizers as needed -lungs are clear on exam -incentive spirometry ordered  Morbidly obese -BMI >50 -Patient will need to follow-up with her PCP to discuss last modifications and/or other interventions.  DVT Prophylaxis  Lovenox  Code Status: Full  Family Communication: None at bedside  Disposition Plan: Admitted. Likely discharge within 1-2 days pending improvement in back pain- dispo pending   Consultants Neurosurgery  Procedures  None  Antibiotics   Anti-infectives (From  admission, onward)   None      Subjective:   Deborah [name] seen and examined today.  Continues to complain of back pain and states she is doing worse today than she was yesterday. Currently sitting in the chair. Denies any chest pain, shortness of breath, abdominal pain, nausea or vomiting, diarrhea or constipation. Objective:   Vitals:   03/21/17 0741 03/21/17 1349 03/21/17 2139 03/22/17 0632  BP: 124/75 131/80 135/83 122/63  Pulse: 87 92 100 91  Resp: 16 17 19 17   Temp: (!) 97.5 F (36.4 C) 98.5 F (36.9 C) 97.7 F (36.5 C) 98 F (36.7 C)  TempSrc: Oral Oral Oral Oral  SpO2: 100% 97% 100% 93%  Weight:      Height:        Intake/Output Summary (Last 24 hours) at 03/22/2017 1148 Last data filed at 03/22/2017 0900 Gross per 24 hour  Intake 1140 ml  Output -  Net 1140 ml   Filed Weights   03/19/17 0951  Weight: (!) 140.6 kg (310 lb)   Exam (no change from exam on 03/21/2017)  General: Well developed, well nourished, NAD  HEENT: NCAT, mucous membranes moist.   Cardiovascular: S1 S2 auscultated, no rubs, murmurs or gallops. Regular rate and rhythm.  Respiratory: Clear to auscultation bilaterally with equal chest rise  Abdomen: Soft, obese, nontender, nondistended, + bowel sounds  Extremities: warm dry without cyanosis clubbing or edema  Neuro: AAOx3, nonfocal  Psych: appropriate  Data Reviewed: I have personally reviewed following labs and imaging studies  CBC: Recent Labs  Lab 03/19/17 1056 03/20/17 0714  WBC 10.4 8.7  NEUTROABS 5.8  --   HGB 10.1* 9.6*  HCT 30.9* 31.0*  MCV 89.0 89.6  PLT 388 258   Basic Metabolic Panel: Recent Labs  Lab 03/19/17 1056 03/20/17 0714 03/21/17 0742 03/22/17 0644  NA 138 138 140 138  K 3.1* 3.8 2.9* 3.4*  CL 103 102 104 102  CO2 26 26 27 26   GLUCOSE 235* 350* 155* 119*  BUN 15 15 17 19   CREATININE 1.04* 1.03* 0.97 0.97  CALCIUM 9.1 8.9 8.7* 8.8*  MG  --  1.5* 1.9 1.6*   GFR: Estimated Creatinine  Clearance: 97.2 mL/min (by C-G formula based on SCr of 0.97 mg/dL). Liver Function Tests: Recent Labs  Lab 03/19/17 1056  AST 26  ALT 17  ALKPHOS 80  BILITOT 1.0  PROT 7.0  ALBUMIN 3.3*   No results for input(s): LIPASE, AMYLASE in the last 168 hours. No results for input(s): AMMONIA in the last 168 hours. Coagulation Profile: No results for input(s): INR, PROTIME in the last 168 hours. Cardiac Enzymes: No results for input(s): CKTOTAL, CKMB, CKMBINDEX, TROPONINI in the last 168 hours. BNP (last 3 results) No results for input(s): PROBNP in the last 8760 hours. HbA1C: No results for input(s): HGBA1C in the last 72 hours. CBG: Recent Labs  Lab 03/21/17 0741 03/21/17 1133 03/21/17 1612 03/21/17 2143 03/22/17 0624  GLUCAP 134* 91 217* 239* 118*   Lipid Profile: No results for input(s): CHOL, HDL, LDLCALC, TRIG, CHOLHDL, LDLDIRECT in the last 72 hours. Thyroid Function Tests: No results for input(s): TSH, T4TOTAL, FREET4, T3FREE, THYROIDAB in the last 72 hours. Anemia Panel: No results for input(s): VITAMINB12, FOLATE, FERRITIN, TIBC, IRON, RETICCTPCT in the last 72 hours. Urine analysis:    Component Value Date/Time   COLORURINE YELLOW 03/19/2017 [place] 03/19/2017 1510   LABSPEC 1.022 03/19/2017 1510  PHURINE 5.0 03/19/2017 1510   GLUCOSEU 50 (A) 03/19/2017 1510   HGBUR MODERATE (A) 03/19/2017 1510   BILIRUBINUR NEGATIVE 03/19/2017 1510   KETONESUR 5 (A) 03/19/2017 1510   PROTEINUR >=300 (A) 03/19/2017 1510   UROBILINOGEN 0.2 10/02/2013 2108   NITRITE NEGATIVE 03/19/2017 1510   LEUKOCYTESUR NEGATIVE 03/19/2017 1510   Sepsis Labs: @LABRCNTIP (procalcitonin:4,lacticidven:4)  )No results found for this or any previous visit (from the past 240 hour(s)).    Radiology Studies: No results found.   Scheduled Meds: . allopurinol  100 mg Oral Daily  . amLODipine  10 mg Oral Daily  . atorvastatin  20 mg Oral QHS  . diclofenac sodium  2 g  Topical QID  . DULoxetine  30 mg Oral BID  . enoxaparin (LOVENOX) injection  40 mg Subcutaneous Q24H  . insulin aspart  0-5 Units Subcutaneous QHS  . insulin aspart  0-9 Units Subcutaneous TID WC  . insulin aspart protamine- aspart  60 Units Subcutaneous Q breakfast   And  . insulin aspart protamine- aspart  45 Units Subcutaneous Q lunch   And  . insulin aspart protamine- aspart  35 Units Subcutaneous Q supper  . metoCLOPramide  10 mg Oral TID AC  . metoprolol succinate  25 mg Oral Daily  . pantoprazole  40 mg Oral BID  . prednisoLONE  20 mg Oral Daily  . sucralfate  1 g Oral TID WC & HS   Continuous Infusions:   LOS: 2 days   Time Spent in minutes   30 minutes  Keonna Raether D.O. on 03/22/2017 at 11:48 AM  Between 7am to 7pm - Pager - [phone]  After 7pm go to [email or web address] - password [email or web address]  And look for the night coverage person covering for me after hours  [facility] Office  [phone]

## 2017-03-22 NOTE — Progress Notes (Signed)
Pt discharged with mom to her house. DME delivered to patient. Scripts called into pharmacy by MD. AVS and work note given to and reviewed with patient and mother. All questions answered. All belongings sent with patient. VSS. BP 111/71 (BP Location: Right Arm)   Pulse 100   Temp 98.7 F (37.1 C) (Oral)   Resp 17   Ht 5\' 6"  (1.676 m)   Wt (!) 140.6 kg (310 lb)   LMP 10/10/2012   SpO2 100%   BMI 50.04 kg/m

## 2017-03-22 NOTE — Care Management Note (Signed)
Case Management Note  Patient Details  Name: Deborah [name] MRN: [id number] Date of Birth: 16-Nov-1965  Subjective/Objective:   52 yr old female admitted with back pain. Patient doesn't qualify for [facility] or a log for SNF.                Action/Plan: Case manager has requested DME for patient. She will receive taxi voucher for transport home tomorrow, going to her brother and sister-in-laws home.   Expected Discharge Date:    03/23/17              Expected Discharge Plan:  Home/Self Care  In-House Referral:  NA  Discharge planning Services  CM Consult  Post Acute Care Choice:  Durable Medical Equipment Choice offered to:  NA  DME Arranged:  [place] wide, 3-N-1 DME Agency:  [place]:  NA [place] Agency:  NA  Status of Service:  Completed, signed off  If discussed at [place] of Stay Meetings, dates discussed:    Additional Comments:  Ninfa Meeker, RN 03/22/2017, 2:48 PM

## 2017-03-22 NOTE — Discharge Instructions (Signed)
Back Pain, Adult Back pain is very common. The pain often gets better over time. The cause of back pain is usually not dangerous. Most people can learn to manage their back pain on their own. Follow these instructions at home: Watch your back pain for any changes. The following actions may help to lessen any pain you are feeling:  Stay active. Start with short walks on flat ground if you can. Try to walk farther each day.  Exercise regularly as told by your doctor. Exercise helps your back heal faster. It also helps avoid future injury by keeping your muscles strong and flexible.  Do not sit, drive, or stand in one place for more than 30 minutes.  Do not stay in bed. Resting more than 1-2 days can slow down your recovery.  Be careful when you bend or lift an object. Use good form when lifting: ? Bend at your knees. ? Keep the object close to your body. ? Do not twist.  Sleep on a firm mattress. Lie on your side, and bend your knees. If you lie on your back, put a pillow under your knees.  Take medicines only as told by your doctor.  Put ice on the injured area. ? Put ice in a plastic bag. ? Place a towel between your skin and the bag. ? Leave the ice on for 20 minutes, 2-3 times a day for the first 2-3 days. After that, you can switch between ice and heat packs.  Avoid feeling anxious or stressed. Find good ways to deal with stress, such as exercise.  Maintain a healthy weight. Extra weight puts stress on your back.  Contact a doctor if:  You have pain that does not go away with rest or medicine.  You have worsening pain that goes down into your legs or buttocks.  You have pain that does not get better in one week.  You have pain at night.  You lose weight.  You have a fever or chills. Get help right away if:  You cannot control when you poop (bowel movement) or pee (urinate).  Your arms or legs feel weak.  Your arms or legs lose feeling (numbness).  You feel sick  to your stomach (nauseous) or throw up (vomit).  You have belly (abdominal) pain.  You feel like you may pass out (faint). This information is not intended to replace advice given to you by your health care provider. Make sure you discuss any questions you have with your health care provider. Document Released: 08/11/2007 Document Revised: 07/31/2015 Document Reviewed: 06/26/2013 Elsevier Interactive Patient Education  2018 Elsevier Inc.  

## 2017-03-23 ENCOUNTER — Emergency Department (HOSPITAL_COMMUNITY)
Admission: EM | Admit: 2017-03-23 | Discharge: 2017-03-23 | Disposition: A | Discharge status: Home / Self Care | Payer: Self-pay | Attending: Emergency Medicine | Admitting: Emergency Medicine

## 2017-03-23 ENCOUNTER — Encounter (HOSPITAL_COMMUNITY): Payer: Self-pay | Admitting: Emergency Medicine

## 2017-03-23 DIAGNOSIS — N182 Chronic kidney disease, stage 2 (mild): Secondary | ICD-10-CM | POA: Insufficient documentation

## 2017-03-23 DIAGNOSIS — M48062 Spinal stenosis, lumbar region with neurogenic claudication: Secondary | ICD-10-CM | POA: Insufficient documentation

## 2017-03-23 DIAGNOSIS — Z79899 Other long term (current) drug therapy: Secondary | ICD-10-CM | POA: Insufficient documentation

## 2017-03-23 DIAGNOSIS — I129 Hypertensive chronic kidney disease with stage 1 through stage 4 chronic kidney disease, or unspecified chronic kidney disease: Secondary | ICD-10-CM | POA: Insufficient documentation

## 2017-03-23 DIAGNOSIS — E1122 Type 2 diabetes mellitus with diabetic chronic kidney disease: Secondary | ICD-10-CM | POA: Insufficient documentation

## 2017-03-23 LAB — URINALYSIS, ROUTINE W REFLEX MICROSCOPIC
Bilirubin Urine: NEGATIVE
Glucose, UA: NEGATIVE mg/dL
Ketones, ur: NEGATIVE mg/dL
Leukocytes, UA: NEGATIVE
Nitrite: NEGATIVE
PH: 6 (ref 5.0–8.0)
Protein, ur: 100 mg/dL — AB
Specific Gravity, Urine: 1.012 (ref 1.005–1.030)

## 2017-03-23 LAB — CBC WITH DIFFERENTIAL/PLATELET
Basophils Absolute: 0 10*3/uL (ref 0.0–0.1)
Basophils Relative: 0 %
EOS PCT: 1 %
Eosinophils Absolute: 0.1 10*3/uL (ref 0.0–0.7)
HEMATOCRIT: 30.5 % — AB (ref 36.0–46.0)
Hemoglobin: 9.9 g/dL — ABNORMAL LOW (ref 12.0–15.0)
LYMPHS ABS: 5 10*3/uL — AB (ref 0.7–4.0)
LYMPHS PCT: 36 %
MCH: 28.8 pg (ref 26.0–34.0)
MCHC: 32.5 g/dL (ref 30.0–36.0)
MCV: 88.7 fL (ref 78.0–100.0)
MONO ABS: 0.8 10*3/uL (ref 0.1–1.0)
MONOS PCT: 5 %
NEUTROS ABS: 7.9 10*3/uL — AB (ref 1.7–7.7)
Neutrophils Relative %: 58 %
PLATELETS: 367 10*3/uL (ref 150–400)
RBC: 3.44 MIL/uL — ABNORMAL LOW (ref 3.87–5.11)
RDW: 14.1 % (ref 11.5–15.5)
WBC: 13.8 10*3/uL — ABNORMAL HIGH (ref 4.0–10.5)

## 2017-03-23 LAB — COMPREHENSIVE METABOLIC PANEL
ALT: 16 U/L (ref 14–54)
AST: 20 U/L (ref 15–41)
Albumin: 3.3 g/dL — ABNORMAL LOW (ref 3.5–5.0)
Alkaline Phosphatase: 80 U/L (ref 38–126)
Anion gap: 9 (ref 5–15)
BILIRUBIN TOTAL: 0.9 mg/dL (ref 0.3–1.2)
BUN: 19 mg/dL (ref 6–20)
CHLORIDE: 102 mmol/L (ref 101–111)
CO2: 26 mmol/L (ref 22–32)
CREATININE: 0.97 mg/dL (ref 0.44–1.00)
Calcium: 8.8 mg/dL — ABNORMAL LOW (ref 8.9–10.3)
GFR calc non Af Amer: >60 mL/min (ref >60)
Glucose, Bld: 125 mg/dL — ABNORMAL HIGH (ref 65–99)
POTASSIUM: 3 mmol/L — AB (ref 3.5–5.1)
Sodium: 137 mmol/L (ref 135–145)
TOTAL PROTEIN: 7 g/dL (ref 6.5–8.1)

## 2017-03-23 LAB — TSH: TSH: 2.065 u[IU]/mL (ref 0.350–4.500)

## 2017-03-23 LAB — VITAMIN B12: Vitamin B-12: 197 pg/mL (ref 180–914)

## 2017-03-23 LAB — MAGNESIUM: MAGNESIUM: 1.7 mg/dL (ref 1.7–2.4)

## 2017-03-23 MED ORDER — POTASSIUM CHLORIDE CRYS ER 20 MEQ PO TBCR
40.0000 meq | EXTENDED_RELEASE_TABLET | Freq: Once | ORAL | Status: AC
  Administered 2017-03-23: 40 meq via ORAL
  Filled 2017-03-23: qty 2

## 2017-03-23 NOTE — ED Provider Notes (Signed)
    Durable Medical Equipment  (From admission, onward)        Start     Ordered   03/23/17 1556  For home use only DME standard manual wheelchair with seat cushion  Once    Comments:  Patient suffers from spinal stenosis which impairs their ability to perform daily activities like toileting in the home.  A walker will not resolve  issue with performing activities of daily living. A wheelchair will allow patient to safely perform daily activities. Patient can safely propel the wheelchair in the home or has a caregiver who can provide assistance.  Accessories: elevating leg rests (ELRs), wheel locks, extensions and anti-tippers.   03/23/17 1555   03/23/17 1554  For home use only DME wheelchair cushion (seat and back)  Once     03/23/17 1554        Gareth Morgan, MD 03/23/17 1556

## 2017-03-23 NOTE — ED Notes (Addendum)
Patient refused to ambulate with an assisted device. Sch[place]man,MD made aware.

## 2017-03-23 NOTE — Progress Notes (Signed)
CSW received consult for patient needing SNF place. Per notes, patient was recently admitted at [facility] on 1/12- 1/15. PT did evaluated patient and recommend SNF placement however patient does not currently have insurance. Patient was walking 105ft(X2) at most recent PT eval on 1/15. During this visit CSW working with patient requested LOG to pay for SNF stay which was declined.   CSW contacted social work Surveyor, quantity to follow up with reasons for LOG denial/ update that patient was back in ED. CSW can offer SNF placement if patient/ family is agreeable to pay privately for SNF placement. Surveyor, quantity has still declined LOG due to no insurance and patient able to walk at most recent PT eval. Patient is able to have [facility] services provided if needed.   EDP, RN, and RN CM updated.   Kingsley Spittle, [facility] Emergency Room Clinical Social Worker [phone]

## 2017-03-23 NOTE — Care Management Note (Signed)
Case Management Note  CM contacted for assistance with pt and situation.  CM noted that CM and CSW services were exhausted during pt's recent admission and that pt was given all information needed for follow up.  CM was additionally advised that CSW was speaking to the pt as well as the CM/CSW AD.  CM will defer to CSW.  Updated Dr. Billy Fischer.  No further CM needs noted at this time.

## 2017-03-23 NOTE — ED Notes (Addendum)
Patient got discharged yesterday from [facility] approximately 2:45 pm. Patient states she was not able to walk that well at discharge but was able to use her legs to get into the wheel chair. Patient has a friend help her into the bed. Patient tried to use her walker to go to the bathroom and was unable too because her legs were going numb. Last time patient urinated was this morning in the bed and states she was incontinent. Patient c/o of a shooting pain her right hip/left hip/lower back.

## 2017-03-23 NOTE — ED Notes (Signed)
Spoke with PT. No one has picked the patient up yet but PT made a note that patient may be getting admitted and for PT to try to see them as soon as possible.

## 2017-03-23 NOTE — ED Provider Notes (Signed)
[facility] DEPT Provider Note   [name]: [id number] Arrival date & time: 03/23/17  9179     History   Chief Complaint Chief Complaint  Patient presents with  . Extremity Weakness    HPI Deborah [name] is a 52 y.o. female.  HPI   Presents with numbness, reports urinating on self Reports last Wednesday and legs hurting, thought it was radicular pain, Thursday received prednisone and flexeril, when got up felt like couldn't walk.  Friday tried to go to work but couldn't walk.  Was coming here from [place] to see mom, had trouble getting on train but passengers helped. When she arrived here she was seen at [facility] and then transferred and admitted to [facility]. Felt like back was slumping over and couldn't stand up straight or walk.  Right leg numbness started Wednesday, both legs numb on Friday.  Urinating on self started last night.  Urinating more often. No difficulty urinating but it just comes out.  Has had loss of control of bowels which started last night.  Weakness in legs started last Wednesday but feels like it is progressive.  Feels like can't do anything from waist down.  No fevers or other concerns today.  Patient acknowledges pain when asked, however primary concern is weakness.  Patient had just been discharged from the hospital yesterday after being admitted for difficulty walking.  Neurosurgery evaluated patient while she is in the hospital, and noted stenosis with neurogenic claudication, lumbar degenerative spondylotic disease.  They recommended follow-up with neurosurgery, patient reported she wanted to follow-up in [place] per their report.    Past Medical History:  Diagnosis Date  . Chest pain 12/2015  . Diabetes mellitus   . Fibromyalgia   . Gastric ulcer   . Gastroparesis   . Gout   . Hyperlipidemia   . Hypertension   . Obesity   . Pneumonia   . Stroke [facility]) 02/2011    Patient Active Problem List   Diagnosis Date Noted  .  Back pain 03/19/2017  . Stroke (cerebrum) ([place]) 03/19/2017  . GERD (gastroesophageal reflux disease) 03/19/2017  . Depression 03/19/2017  . Obesity   . Urinary tract infection 08/16/2016  . Anemia 08/16/2016  . Gastroparesis 08/16/2016  . Intractable nausea and vomiting 06/17/2016  . Diabetic gastroparesis ([place]) 06/05/2016  . Gout 06/05/2016  . AKI (acute kidney injury) ([place]) 12/06/2015  . Hypokalemia 09/26/2015  . Hypomagnesemia 09/26/2015  . Nausea and vomiting 08/20/2015  . Gout flare 05/27/2015  . Abdominal pain 05/26/2015  . DKA (diabetic ketoacidoses) ([place]) 05/25/2015  . Uncontrolled type 2 diabetes mellitus with diabetic neuropathy, with long-term current use of insulin ([place]) 05/25/2015  . Dyslipidemia associated with type 2 diabetes mellitus ([place]) 05/25/2015  . CKD (chronic kidney disease), stage II 05/25/2015  . Essential hypertension, benign 09/28/2013    Past Surgical History:  Procedure Laterality Date  . CATARACT EXTRACTION  01/2014  . CHOLECYSTECTOMY      OB History    No data available       Home Medications    Prior to Admission medications   Medication Sig Start Date End Date Taking? Authorizing Provider  allopurinol (ZYLOPRIM) 100 MG tablet Take 1 tablet (100 mg total) by mouth daily. 09/23/15  Yes Debbe Odea, MD  amLODipine (NORVASC) 10 MG tablet Take 1 tablet (10 mg total) by mouth daily. 06/08/16  Yes Nita Sells, MD  atorvastatin (LIPITOR) 20 MG tablet Take 20 mg by mouth at bedtime.  Yes [provider]  DULoxetine (CYMBALTA) 30 MG capsule Take 1 capsule (30 mg total) by mouth 2 (two) times daily. 09/23/15  Yes Debbe Odea, MD  Magnesium Oxide 400 (240 Mg) MG TABS Take 1 tablet (400 mg total) by mouth daily at 2 PM. 08/23/16  Yes Florencia Reasons, MD  metoCLOPramide (REGLAN) 10 MG tablet Take 1 tablet (10 mg total) by mouth 3 (three) times daily before meals. 08/23/16  Yes Florencia Reasons, MD  metoprolol succinate (TOPROL-XL) 25 MG 24 hr tablet  Take 1 tablet (25 mg total) by mouth daily. 06/08/16  Yes Nita Sells, MD  NOVOLOG MIX 70/30 (70-30) 100 UNIT/ML injection 75 units in am, 60 units  lunch, and 50 units pm 06/08/16  Yes Nita Sells, MD  pantoprazole (PROTONIX) 40 MG tablet Take 1 tablet (40 mg total) by mouth 2 (two) times daily. 06/08/16  Yes Nita Sells, MD  sucralfate (CARAFATE) 1 g tablet Take 1 tablet (1 g total) by mouth 4 (four) times daily -  with meals and at bedtime. 08/23/16  Yes Florencia Reasons, MD  albuterol (PROVENTIL HFA;VENTOLIN HFA) 108 (90 Base) MCG/ACT inhaler Inhale 2 puffs into the lungs every 6 (six) hours as needed for wheezing or shortness of breath. Patient not taking: Reported on 03/23/2017 09/23/15   Debbe Odea, MD  diclofenac sodium (VOLTAREN) 1 % GEL Apply 2 g topically 4 (four) times daily. Patient not taking: Reported on 03/23/2017 03/22/17   Cristal Ford, DO  nitroGLYCERIN (NITROSTAT) 0.4 MG SL tablet Place 1 tablet (0.4 mg total) under the tongue every 5 (five) minutes as needed for chest pain. Patient not taking: Reported on 03/23/2017 12/20/15   [name], [name], MD  oxyCODONE (OXY IR/ROXICODONE) 5 MG immediate release tablet Take 1 tablet (5 mg total) by mouth every 4 (four) hours as needed for moderate pain. Patient not taking: Reported on 03/23/2017 03/22/17   Cristal Ford, DO  potassium chloride (K-DUR) 10 MEQ tablet Take 2 tablets (20 mEq total) by mouth daily for 7 days. Patient not taking: Reported on 03/23/2017 03/22/17 03/29/17  Cristal Ford, DO    Family History Family History  Problem Relation Age of Onset  . Diabetes Mother   . Diabetes Father   . Heart disease Father   . Diabetes Sister   . Congestive Heart Failure Sister 24  . Diabetes Brother     Social History Social History   Tobacco Use  . Smoking status: Never Smoker  . Smokeless tobacco: Never Used  Substance Use Topics  . Alcohol use: No  . Drug use: No     Allergies   Diazepam;  Lisinopril; Penicillins; Acetaminophen; Tolmetin; Aspirin; Food; Nsaids; and Tramadol   Review of Systems Review of Systems  Constitutional: Positive for appetite change. Negative for fever.  HENT: Negative for sore throat.   Eyes: Negative for visual disturbance.  Respiratory: Negative for cough and shortness of breath.   Cardiovascular: Negative for chest pain.  Gastrointestinal: Negative for abdominal pain, nausea and vomiting.  Genitourinary: Positive for frequency. Negative for difficulty urinating (no difficulty urinating but urinating on self).  Musculoskeletal: Positive for back pain. Negative for neck pain.  Skin: Negative for rash.  Neurological: Positive for weakness and numbness. Negative for syncope and headaches.     Physical Exam Updated Vital Signs BP (!) 147/84   Pulse 100   Temp (!) 97.4 F (36.3 C) (Oral)   Resp 18   LMP 10/10/2012   SpO2 100%   Physical Exam  Constitutional: She is oriented to person, place, and time. She appears well-developed and well-nourished. No distress.  HENT:  Head: Normocephalic and atraumatic.  Eyes: Conjunctivae and EOM are normal.  Neck: Normal range of motion.  Cardiovascular: Normal rate, regular rhythm, normal heart sounds and intact distal pulses. Exam reveals no gallop and no friction rub.  No murmur heard. Pulmonary/Chest: Effort normal and breath sounds normal. No respiratory distress. She has no wheezes. She has no rales.  Abdominal: Soft. She exhibits no distension. There is no tenderness. There is no guarding.  Musculoskeletal: She exhibits no edema or tenderness.  Neurological: She is alert and oriented to person, place, and time. A sensory deficit (right lateral side of foot) is present. GCS eye subscore is 4. GCS verbal subscore is 5. GCS motor subscore is 6.  Reflex Scores:      Tricep reflexes are 1+ on the left side. 4/5 strength hip flexion, extension, abduction, knee flexion/extension,  plantar/dorsiflexion  Unable to obtain LE reflexes limited by body habitus, DM  Skin: Skin is warm and dry. No rash noted. She is not diaphoretic. No erythema.  Nursing note and vitals reviewed.    ED Treatments / Results  Labs (all labs ordered are listed, but only abnormal results are displayed) Labs Reviewed  CBC WITH DIFFERENTIAL/PLATELET - Abnormal; Notable for the following components:      Result Value   WBC 13.8 (*)    RBC 3.44 (*)    Hemoglobin 9.9 (*)    HCT 30.5 (*)    Neutro Abs 7.9 (*)    Lymphs Abs 5.0 (*)    All other components within normal limits  COMPREHENSIVE METABOLIC PANEL - Abnormal; Notable for the following components:   Potassium 3.0 (*)    Glucose, Bld 125 (*)    Calcium 8.8 (*)    Albumin 3.3 (*)    All other components within normal limits  URINALYSIS, ROUTINE W REFLEX MICROSCOPIC - Abnormal; Notable for the following components:   Hgb urine dipstick SMALL (*)    Protein, ur 100 (*)    Bacteria, UA RARE (*)    Squamous Epithelial / LPF 0-5 (*)    All other components within normal limits  VITAMIN B12  TSH  MAGNESIUM  RPR  HIV ANTIBODY (ROUTINE TESTING)    EKG  EKG Interpretation None       Radiology No results found.  Procedures Procedures (including critical care time)  Medications Ordered in ED Medications  potassium chloride SA (K-DUR,KLOR-CON) CR tablet 40 mEq (40 mEq Oral Given 03/23/17 1145)     Initial Impression / Assessment and Plan / ED Course  I have reviewed the triage vital signs and the nursing notes.  Pertinent labs & imaging results that were available during my care of the patient were reviewed by me and considered in my medical decision making (see chart for details).     52 year old female with a history of diabetes, hypertension, hyperlipidemia, CVA, gastroparesis, who was admitted and discharged yesterday with concern for lower extremity weakness and back pain, likely related to spinal stenosis and  neurogenic claudication, who presents with concern for difficulty walking.  Patient had also reported urinary incontinence.  Her postvoid residual is within normal limits, and have lower suspicion for urinary retention resulting in overflow incontinence.  She is also reported incontinence of stool, however on exam has normal rectal tone.  She is able to move her bilateral lower extremities, with approximately 4 out of 5 strength throughout.  Reflexes are difficult to obtain given patient's body habitus, and history of diabetes, however was able to get 1+ triceps, and given history, physical not consistent with GBS, and have other explanation for symptoms I doubt GBS.  Ordered other labs including RPR, B12, TSH. TSH and B12 WNL.  Do not feel further imaging of cervical or thoracic spine is indicated, given explanation for patient's symptoms seen on lumbar MRI.  Discussed patient's presentation with neurosurgery.  I do not feel she has cauda equina or acute neurosurgical emergency given history, exam, good rectal tone, and normal postvoid residual.  They do report that they would be happy to see her as an outpatient.  Discussed this with patient.    Patient was noted to have inconsistent behaviors regarding her weakness while she was in inpatient, for example, seen walking down the hallway with her walker, and went initially was not recommended sniff, however when she had this with physical therapy, she began to show some difficulty, and SNF was recommended.  Unfortunately, because patient does not have insurance, she is not able to be placed into a skilled nursing facility.  While patient refuses to walk in the emergency department, feel her physical exam regarding strength in her lower extremities is similar to what have been documented with neurosurgery at her inpatient evaluation.  She is noted to push herself up in bed with her legs.  That being said, given patient refusing to walk in the emergency department,  discussed with hospitalist possible admission for continued physical therapy, possible neurosurgical intervention.  Given there is no need for emergent surgery, and that we are unable to place patient via social work and case management, the hospitalist team recommends outpatient care.  Discussed this with the patient.    Of note, the patient was upset regarding care in the hospital.  She reports to me that she did not realize she had spoken with neurosurgery, and that she did not say she wanted to be seen in [place], and she would prefer to be seen in [place].  I discussed with Ferne Reus PA-C, who had seen patients in the hospital, who had reported he had a prolonged discussion with her, and specifically patient regarding her follow-up, and that they had even discussed calling her surgeon in [place] to facilitate follow up and transfer records.  Notified [employer] PA-C that she was interested in staying in [place] and will have patient follow up with their team.   Consulted case management and social work. While she was an inpatient they had even discussed making her a case to cover her SNF stay, however did not feel feel it was indicated. This time, working on charity home health and we organized for charity wheelchair. It is not clear to me whether she consistently would need wheelchair given strength on exam and inconsistencies, however given she and family reporting she is unable to use walker feel wheelchair to use given this situation is appropriate.  Placed home health order. PTAR to take home.  Encouraged patient to apply for Medicaid. Reports she will remain in [place] and her address is [place]. Discharged with home health, wheelchair to pick up.  Final Clinical Impressions(s) / ED Diagnoses   Final diagnoses:  Spinal stenosis of lumbar region with neurogenic claudication    ED Discharge Orders        [place]     03/23/17 1620    Face-to-face encounter (required for  Medicare/Medicaid patients)  Comments:  I Tenn[place] Must certify that this patient is under my care and that I, or a nurse practitioner or physician's assistant working with me, had a face-to-face encounter that meets the physician face-to-face encounter requirements with this patient on 03/23/2017. The encounter with the patient was in whole, or in part for the following medical condition(s) which is the primary reason for home health care (List medical condition): spinal stenosis, weakness   03/23/17 1620       Gareth Morgan, MD 03/23/17 1718

## 2017-03-23 NOTE — Clinical Social Work Note (Addendum)
Clinical Social Work Assessment  Patient Details  Name: Deborah [name] MRN: [id number] Date of Birth: 1965/12/30  Date of referral:  03/23/17               Reason for consult:  Facility Placement                Permission sought to share information with:    Permission granted to share information::     Name::        Agency::     Relationship::     Contact Information:     Housing/Transportation Living arrangements for the past 2 months:  Apartment Source of Information:  Patient Patient Interpreter Needed:  None Criminal Activity/Legal Involvement Pertinent to Current Situation/Hospitalization:    Significant Relationships:  Parents Lives with:  Self, Parents Do you feel safe going back to the place where you live?  No Need for family participation in patient care:  Yes (Comment)  Care giving concerns:  Patient recently discharged from [facility] 1/15. Arrived to [facility] ED after having worsening back pain due to degenerative disc disease and spinal stenosis as evidenced by MRI/ difficulty walking.   Social Worker assessment / plan:  CSW met with patient via bedside to discuss discharge plans/ barriers to placement. Per notes, patient has recent stay at [facility] 1/12-1/15 and was discharged home. Patient did receive PT during stay which recommended SNF placement however patient does not currently have insurance. Patient was walking 47f(X2) at most recent PT eval on 1/15. During this visit CSW working with patient requested LOG to pay for SNF stay which was declined.  [place] until able to return. Patient stated she has not yet applied to Medicaid [place]    Patient became tearful stating she is unable to walk and unable to discharge from ED. Stating "I should have never left the hospital yesterday". Patient does state she was able to walk out of her hospital room yesterday 1/15 but had  difficulty while doing so. CSW discussed with patient at length regarding barriers to placement at this time, including: no insurance, no medicaid active medicaid application, PT eval suggesting patient walking 35+ feet.   PLAN: EDP has consulted PT for evaluation however patient still has no payor source for SNF placement. At this time patient will have to DC [place] plan with social work aSurveyor, quantity    CSW signing off- no further needs.   Employment status:  Unemployed IForensic scientist  Self Pay (Medicaid Pending) PT Recommendations:  [place]/ Referral to community resources:     Patient/Family's Response to care:  Patient disagreeing with CSW plan at this time. Crying at bedside.   Patient/Family's Understanding of and Emotional Response to Diagnosis, Current Treatment, and Prognosis:  Currently waiting on PT consult.   Emotional Assessment Appearance:  Appears stated age Attitude/Demeanor/Rapport:  Crying, Angry Affect (typically observed):  Irritable Orientation:  Oriented to Self, Oriented to Place, Oriented to  Time, Oriented to Situation Alcohol / Substance use:    Psych involvement (Current and /or in the community):  No (Comment)  Discharge Needs  Concerns to be addressed:  Care Coordination, Discharge Planning Concerns Readmission within the last 30 days:  Yes Current discharge risk:  None Barriers to Discharge:  No Barriers Identified   EWeston Anna LCSW 03/23/2017, 12:32 PM

## 2017-03-23 NOTE — ED Triage Notes (Signed)
Per [facility] patient from home for weakness in legs and painful to stand. Patient was discharged yesterday from [facility] hospital yesterday where supposed to follow up with neurosurgery and prescriptions for pain medications. Patient reported that she hasnt had time to call for follow up appt yet.  CBG 175

## 2017-03-23 NOTE — ED Notes (Signed)
Attempted to call PT [phone]. No answer. Left a voicemail.

## 2017-03-23 NOTE — ED Notes (Signed)
Bed: WA20 Expected date: 03/23/17 Expected time: 8:05 AM Means of arrival: Ambulance Comments: Leg pain, recently hospitalized for same

## 2017-03-24 LAB — RPR: RPR Ser Ql: NONREACTIVE

## 2017-03-24 LAB — HIV ANTIBODY (ROUTINE TESTING W REFLEX): HIV SCREEN 4TH GENERATION: NONREACTIVE

## 2017-03-24 NOTE — Care Management Note (Signed)
Case Management Note  Patient Details  Name: Deborah [name] MRN: [id number] Date of Birth: [birth year]  CM contacted for possible HHS and DME wheelchair.  CM contacted [name] who came down to speak with the pt about possible charity HHS and wheelchair.  Pt was able to received the wheelchair and information was gathered for the charity application.  Per [facility] the application will not be processed until pt has a follow up appointment as per D/C instructions on 03/22/2017.  As of 03/24/2017 pt has not called to schedule an appointment for follow up and [facility] is holding the application until a PCP is available for orders.  Per [facility] pt and pt's mother knew that this was needed.  CM will update [place] CM Jane for possible assistance.  No further CM needs noted at this time.  Expected Discharge Date:   03/23/2017               Expected Discharge Plan:  Home/Self Care  In-House Referral:  Clinical Social Work  Discharge planning Services  CM Consult  Post Acute Care Choice:  Durable Medical Equipment, Home Health Choice offered to:  Patient, Parent  DME Arranged:  Programmer, multimedia DME Agency:   [facility]  Status of Service:  Completed, signed off  Rae Mar, RN 03/24/2017, 11:06 AM

## 2017-03-29 ENCOUNTER — Telehealth: Payer: Self-pay

## 2017-03-29 NOTE — Telephone Encounter (Signed)
Message received from Haze Justin, RN CM noting that the patient would benefit from follow up at the [facility]. The contact information for [facility] was placed on her ED AVS.  Attempted to contact the patient x3 today to discuss plans for follow up and also provide her with the contact #s for the Patient [place] and [place] as there are not any hospital follow up appointments available at [facility] at this time;  but the line is busy.   Update provided to Orpha Bur, RN CM

## 2017-03-31 IMAGING — CT CT ABD-PELV W/ CM
2 of 5 series · 16 of 46 positions shown, 18 images · IV contrast (OMNIPAQUE 300)
Comparison: Chest CT- 12/10/2012; 09/22/2011

CLINICAL DATA: Right lower quadrant abdominal pain.

EXAM:
CT ABDOMEN AND PELVIS WITH CONTRAST
TECHNIQUE: Multidetector CT imaging of the abdomen and pelvis was performed
using the standard protocol following bolus administration of
intravenous contrast.
CONTRAST:  100mL OMNIPAQUE IOHEXOL 300 MG/ML  SOLN

[Series 2: abd/pel with · axial · 0.86mm/px · z∈[+1119,+1544]mm · 13 of 96 slices shown, 15 images]
[im 6/96  soft-tissue]
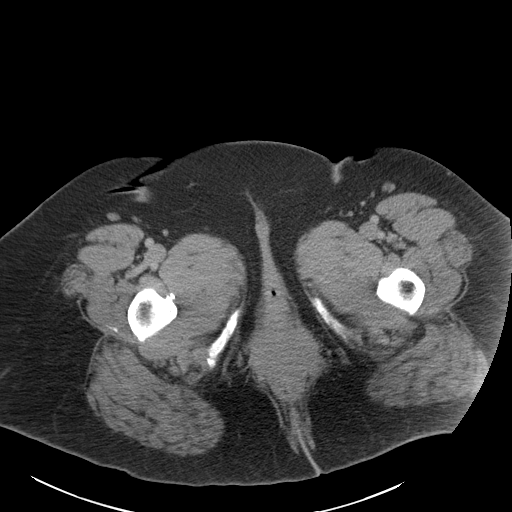
[im 6/96  bone]
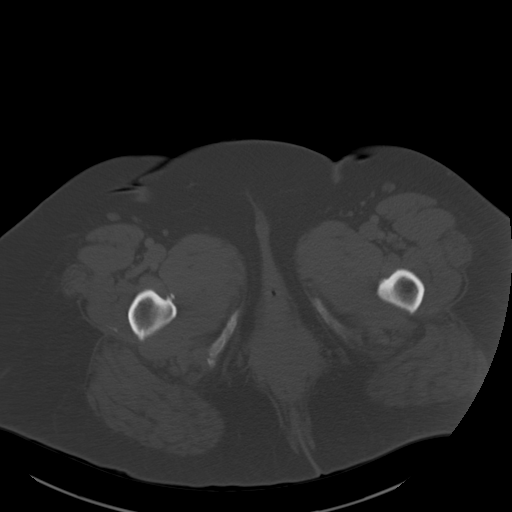
[im 16/96  soft-tissue]
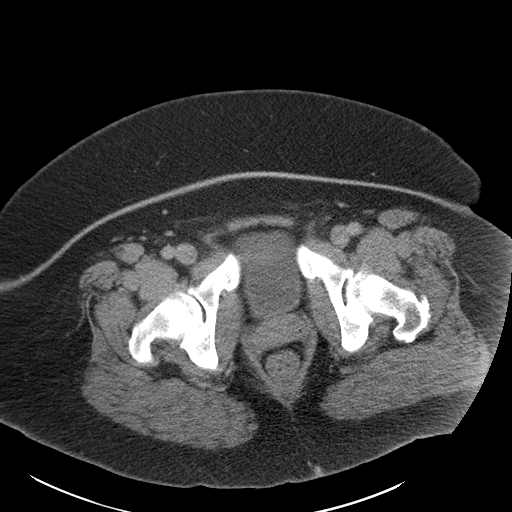
[im 21/96  soft-tissue]
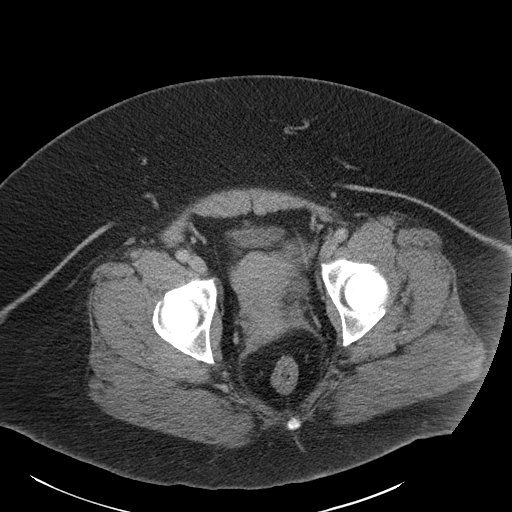
[im 26/96  soft-tissue]
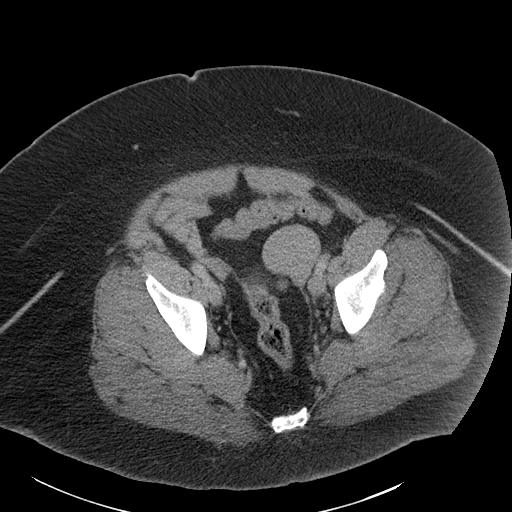
[im 36/96  soft-tissue]
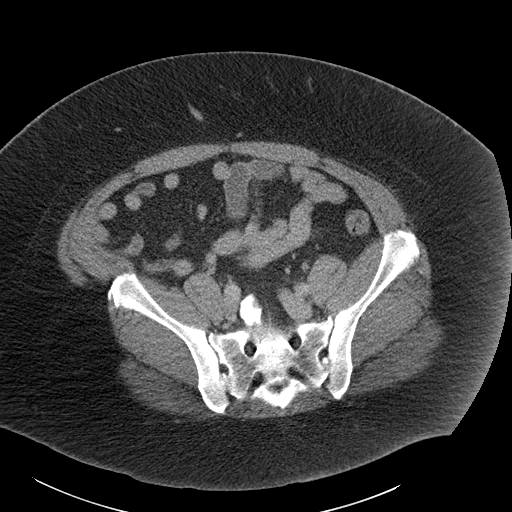
[im 41/96  soft-tissue]
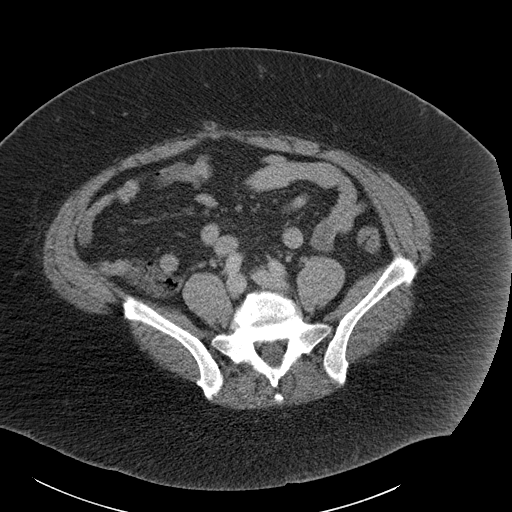
[im 51/96  soft-tissue]
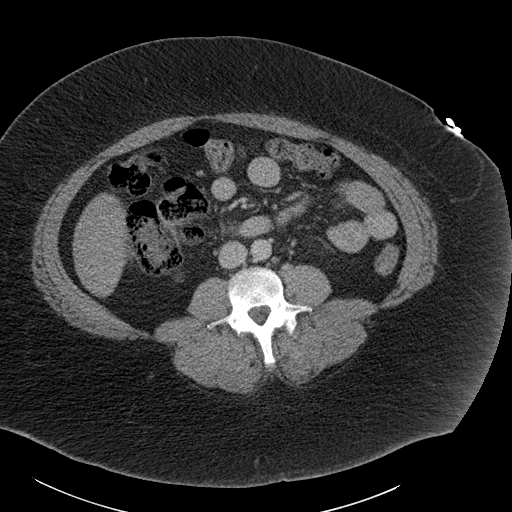
[im 56/96  soft-tissue]
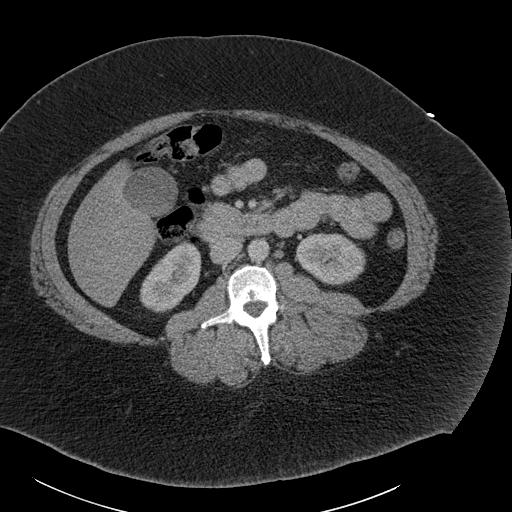
[im 61/96  soft-tissue]
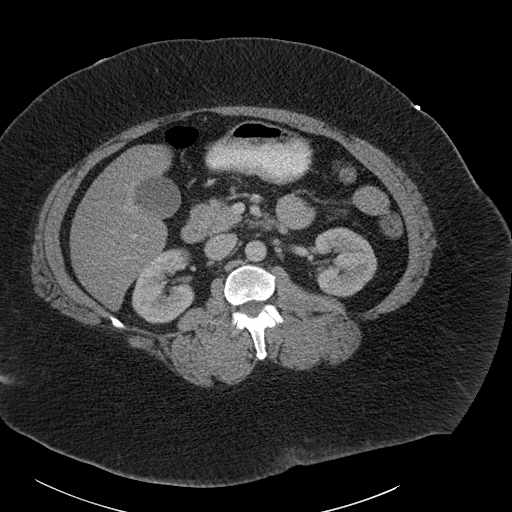
[im 61/96  bone]
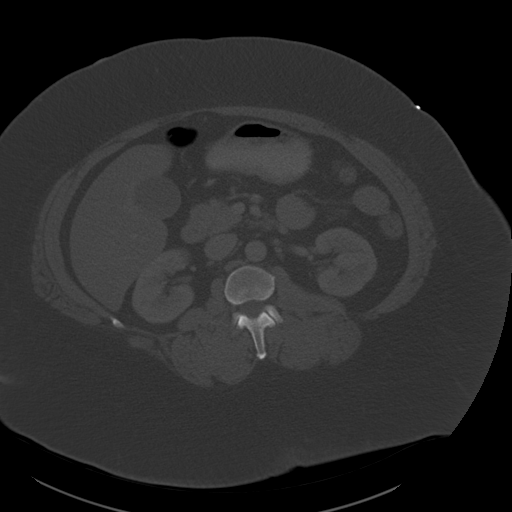
[im 71/96  soft-tissue]
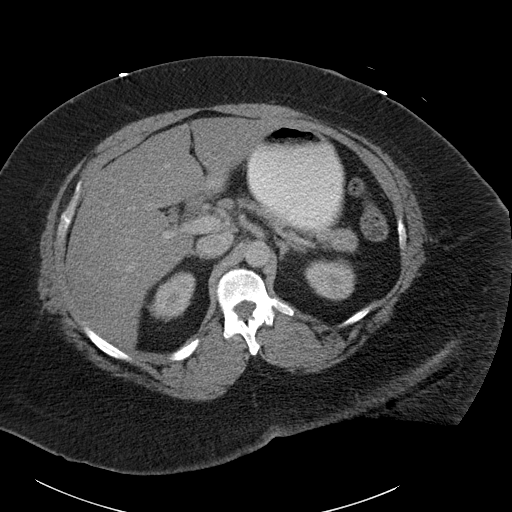
[im 76/96  soft-tissue]
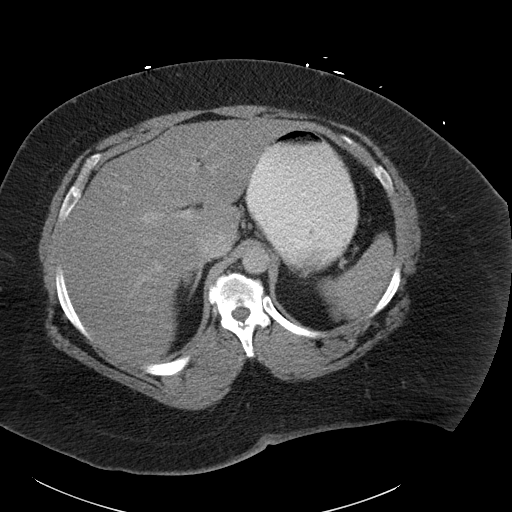
[im 81/96  soft-tissue]
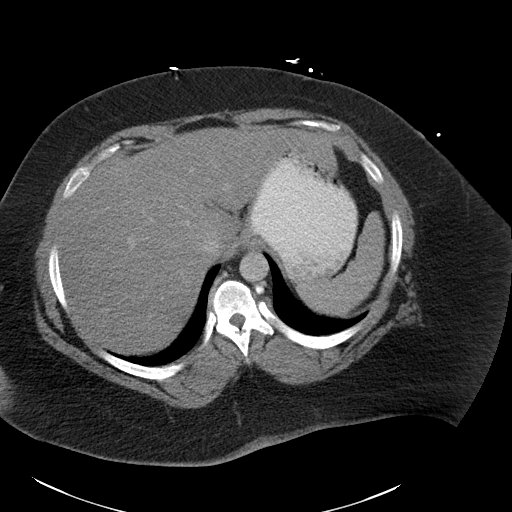
[im 91/96  soft-tissue]
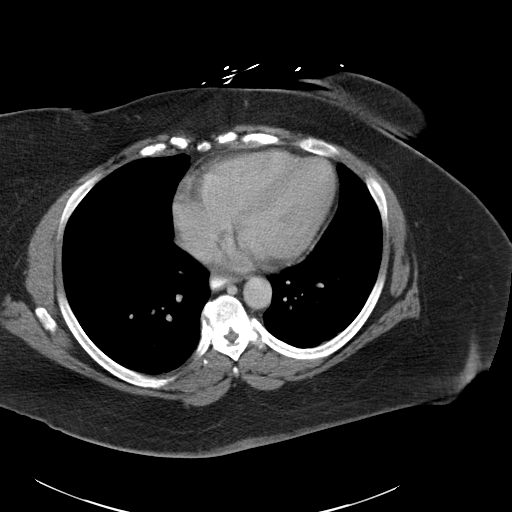

[Series 3: coronal a/|p · coronal · 0.80mm/px · 3 of 103 slices shown]
[im 35/103  soft-tissue]
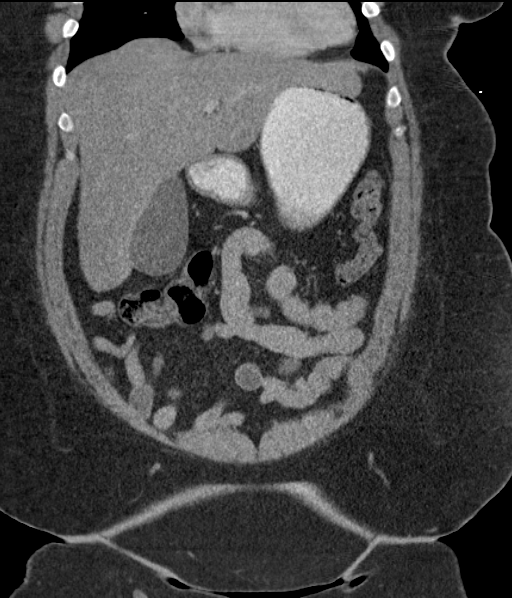
[im 46/103  soft-tissue]
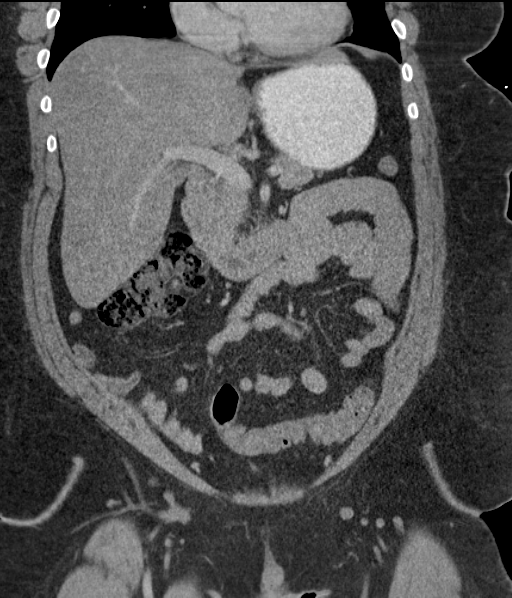
[im 57/103  soft-tissue]
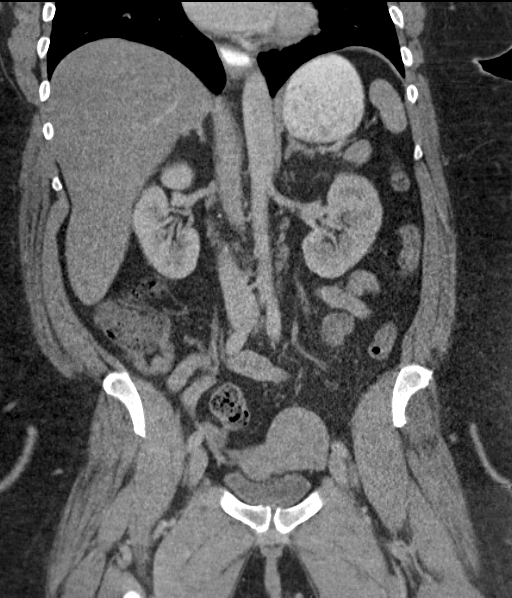

[16 of 46 positions shown; findings below may reference images not displayed]

FINDINGS: Normal hepatic contour. There is diffuse decreased attenuation of
the hepatic parenchyma suggestive hepatic steatosis. No discrete
hepatic lesions. Normal appearance of the gallbladder. No radiopaque
gallstones. No intra or extrahepatic biliary ductal dilatation. No
ascites.

There is symmetric enhancement and excretion of the bilateral
kidneys. No definite renal stones. No discrete renal lesions. No
urinary obstruction or perinephric stranding. Normal appearance of
the bilateral adrenal glands, pancreas and spleen.

Moderate colonic stool burden. No evidence of enteric obstruction.
Normal appearance of the terminal ileum and appendix. No
pneumoperitoneum, pneumatosis or portal venous gas.

Normal caliber the abdominal aorta. The major branch vessels of the
abdominal aorta appear patent on this non CTA examination.

No bulky retroperitoneal, mesenteric, pelvic or inguinal
lymphadenopathy.

There is an approximately 5.0 x 5.6 cm structure within the left
adnexa which is incompletely evaluated though favored to represent
either an exophytic fibroid versus a broad ligament fibroid.
Remaining pelvic organs appear normal. No free fluid in the pelvic
cul-de-sac. Normal appearance of the urinary bladder given degree of
distention.

Limited visualization of the lower thorax demonstrates minimal
dependent subpleural ground-glass atelectasis within image right
lower lobe.

No acute or aggressive osseous abnormalities. Mild-to-moderate
multilevel lumbar spine DDD, worse at L5-S1 with disc space height
loss, endplate irregularity and sclerosis. Moderate to severe
bilateral deficits facet degenerative change within the lower lumbar
spine.

Regional soft tissues appear normal.
IMPRESSION: 1. No definite explanation for patient's persistent right lower
quadrant abdominal pain. Specifically, no evidence of enteric or
urinary obstruction. Normal appearance of the appendix.
2. Approximately 5.6 cm structure within the left at adnexa,
incompletely evaluated though favored to represent either an
exophytic fibroid versus a broad ligament fibroid. Further
evaluation with pelvic ultrasound could be performed as clinically
indicated.

## 2017-03-31 IMAGING — CR DG CHEST 2V
2 series · 2 of 2 positions shown · non-contrast
Comparison: 07/19/2014

CLINICAL DATA: Central chest pain for 4 hours

EXAM:
CHEST - 2 VIEW

[w chest pa]
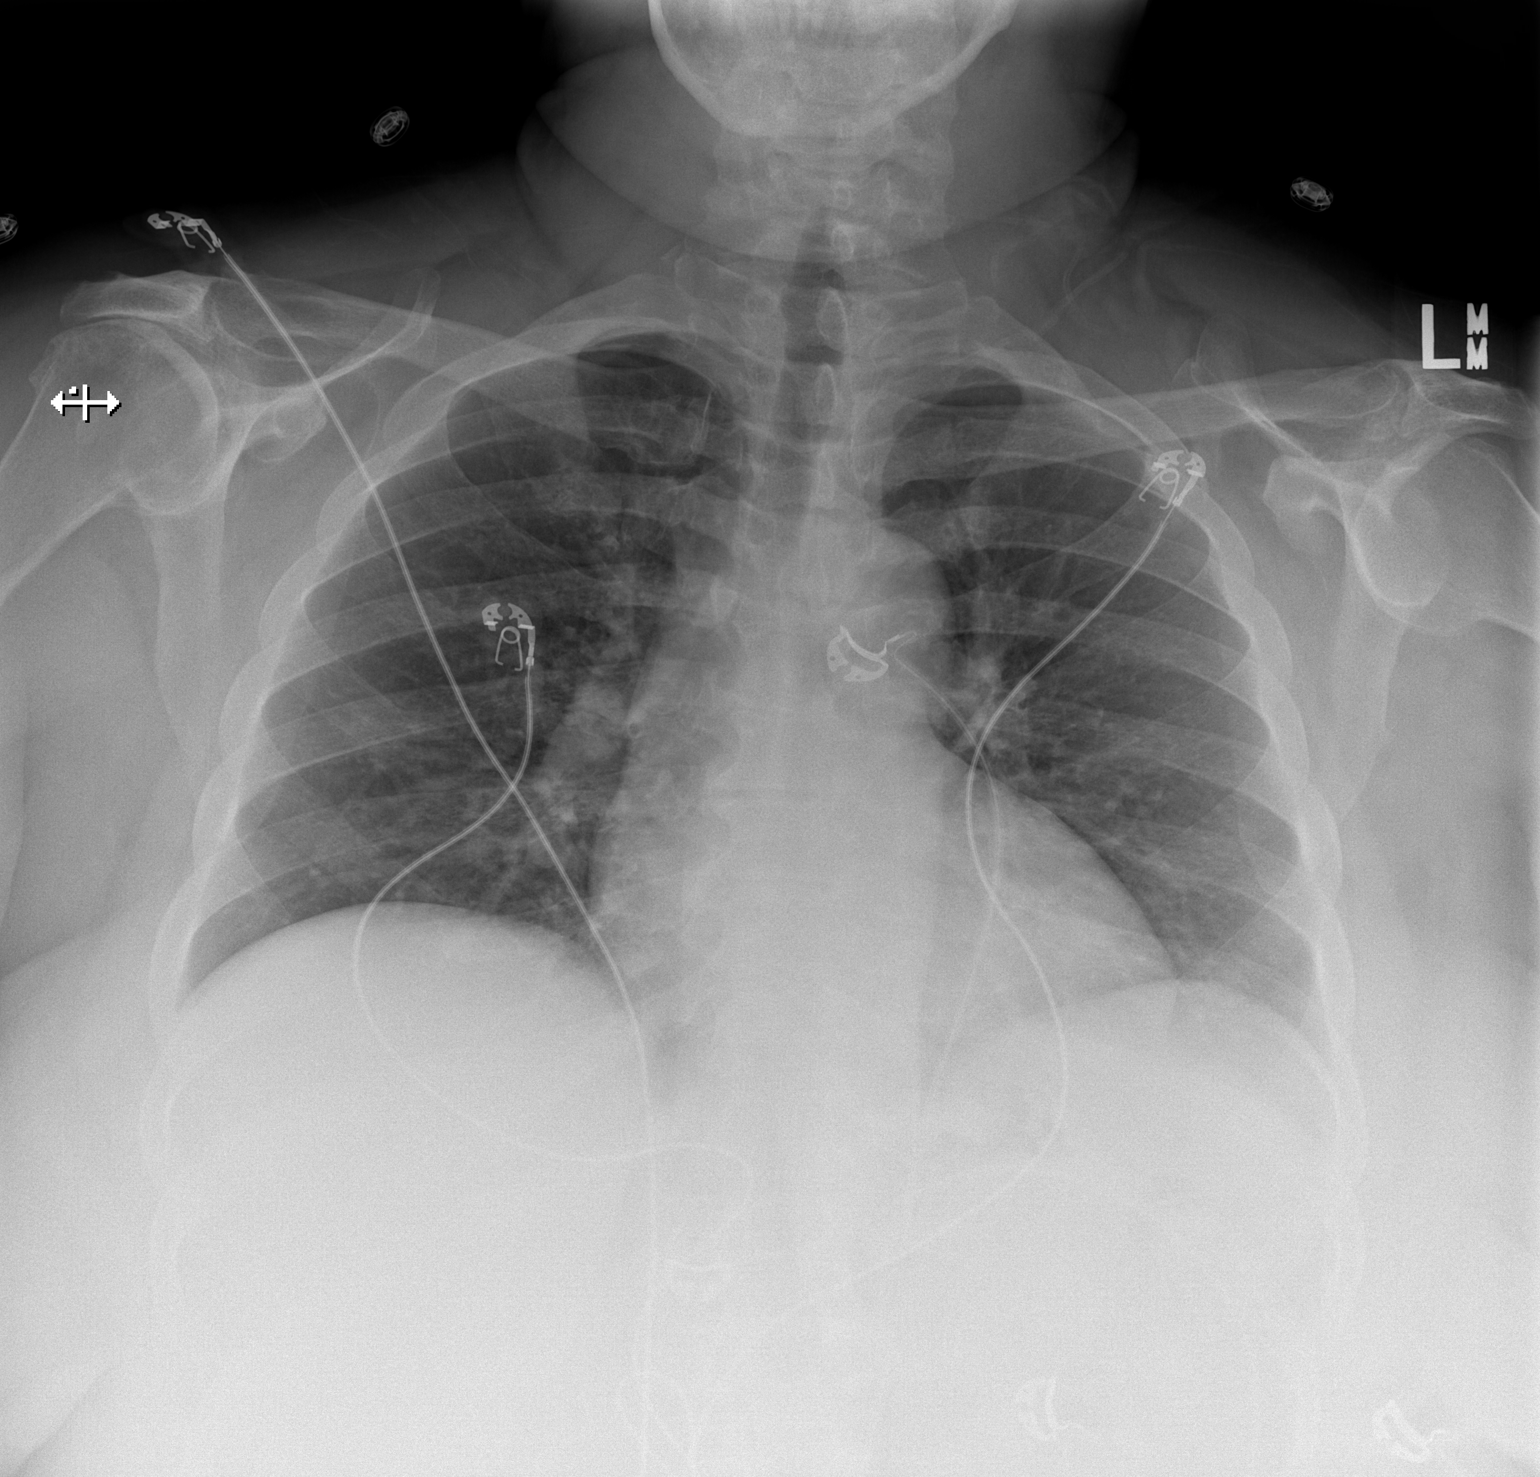

[w chest lat]
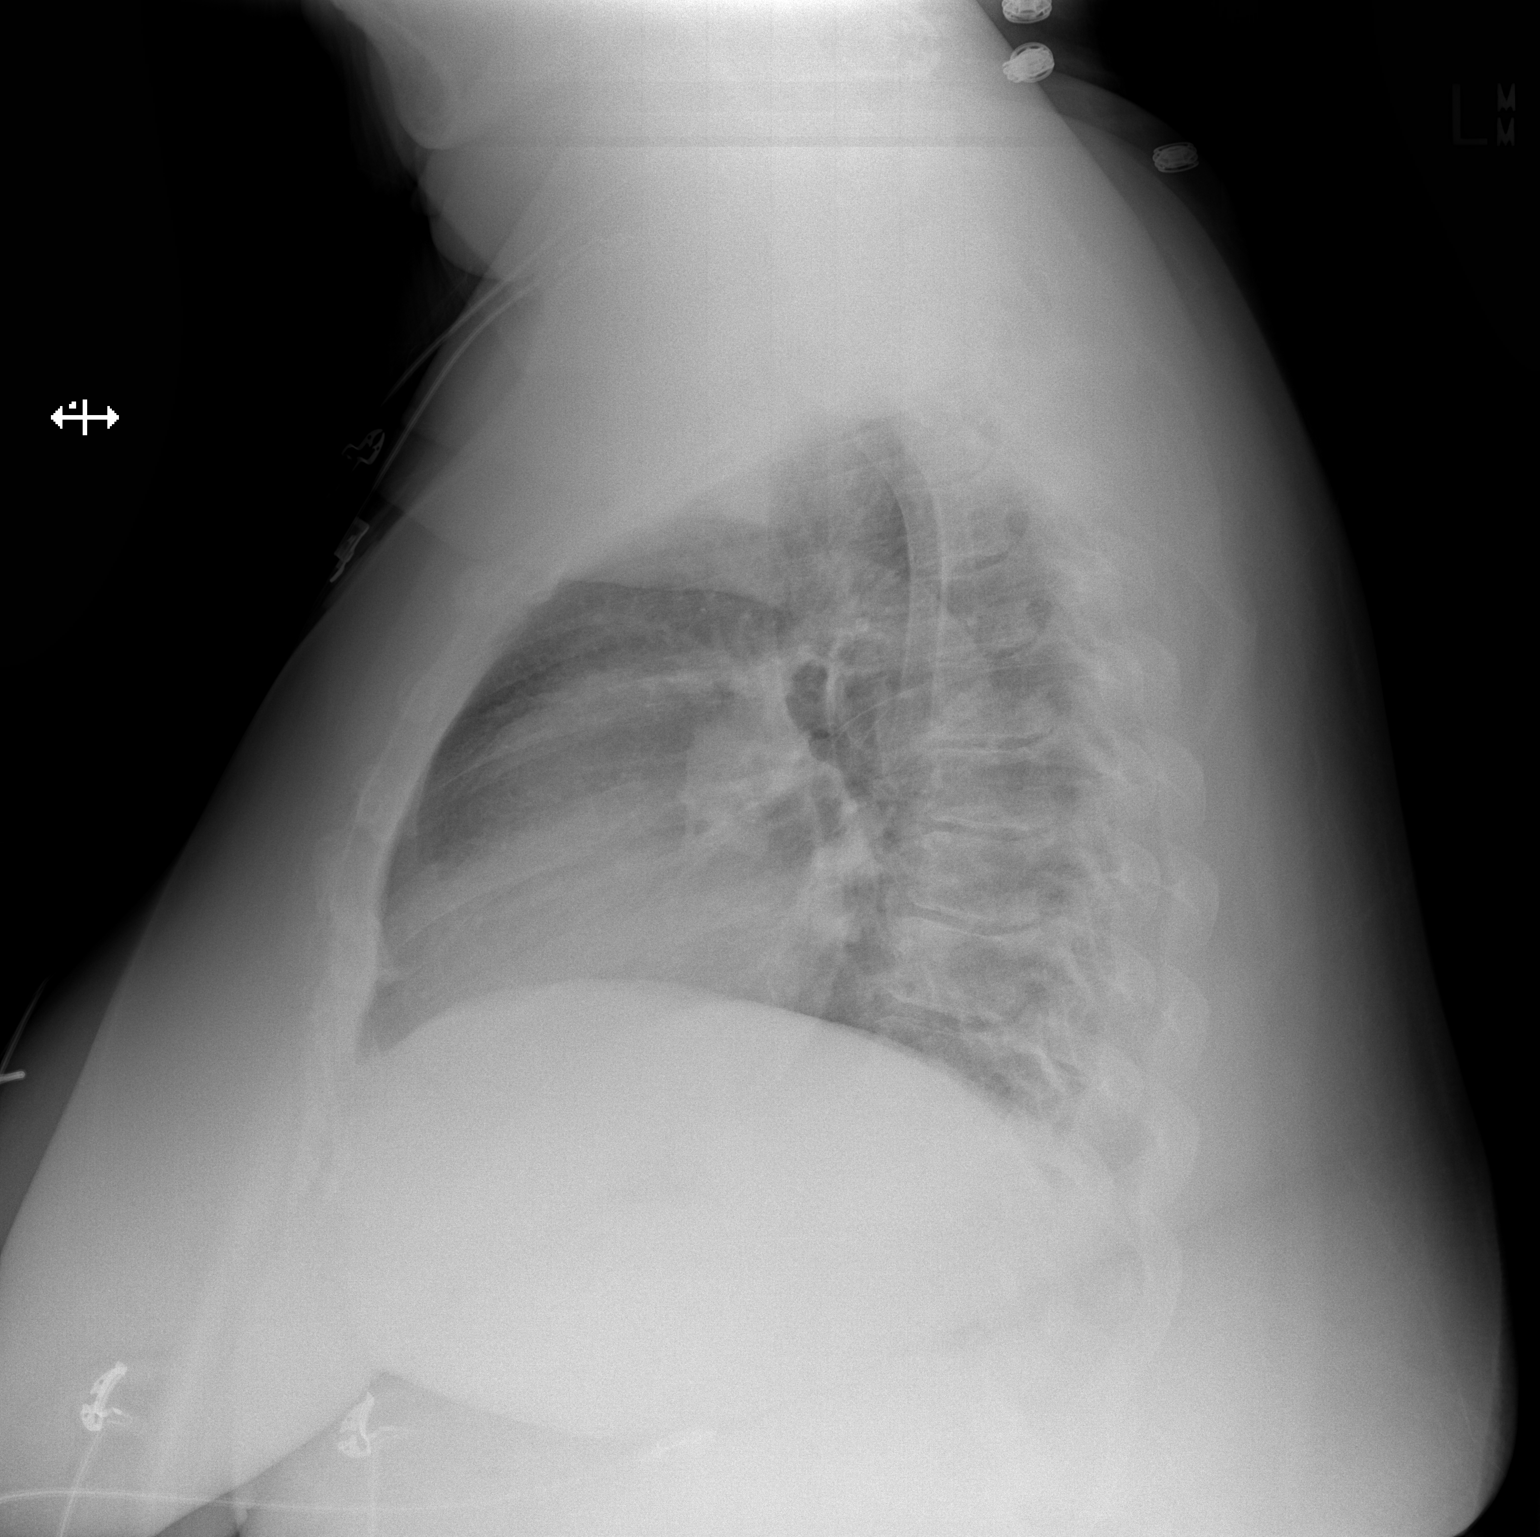

[2 of 2 positions shown; findings below may reference images not displayed]

FINDINGS: The heart size and mediastinal contours are within normal limits.
Both lungs are clear. The visualized skeletal structures are
unremarkable.
IMPRESSION: No active disease.

## 2017-04-06 ENCOUNTER — Telehealth: Payer: Self-pay

## 2017-04-06 NOTE — Telephone Encounter (Signed)
Message received from Haze Justin, RN CM noting that the patient would benefit from follow up at the [facility]. The contact information for [facility] was placed on her ED AVS.  Attempted to contact the patient again today to discuss plans for follow up and also provide her with the contact #s for the [facility] [place] and [place] as there are not any hospital follow up appointments available at [facility] at this time.  Call placed to her # [phone] and the line continue to ring busy. Call placed to her mother, Yissel Habermehl # [phone] and a message was left with her mother to have her return the call to # [phone] and ask for Opal Sidles or Venetia Night.

## 2017-04-18 ENCOUNTER — Telehealth: Payer: Self-pay

## 2017-04-18 NOTE — Telephone Encounter (Signed)
Letter sent to the patient requesting that she contact [facility] to schedule a follow up appointment as we have not been able to reach her.

## 2017-04-22 IMAGING — CR DG CHEST 2V
2 series · 2 of 2 positions shown · non-contrast
Comparison: Chest x-ray dated 02/20/2015.

CLINICAL DATA: Central chest tightness with pain radiating down
left arm, nausea/ vomiting/ diarrhea starting this morning. Recent
history of pancreatitis. Additional history of hypertension and
diabetes.

EXAM:
CHEST  2 VIEW

[w chest lat]
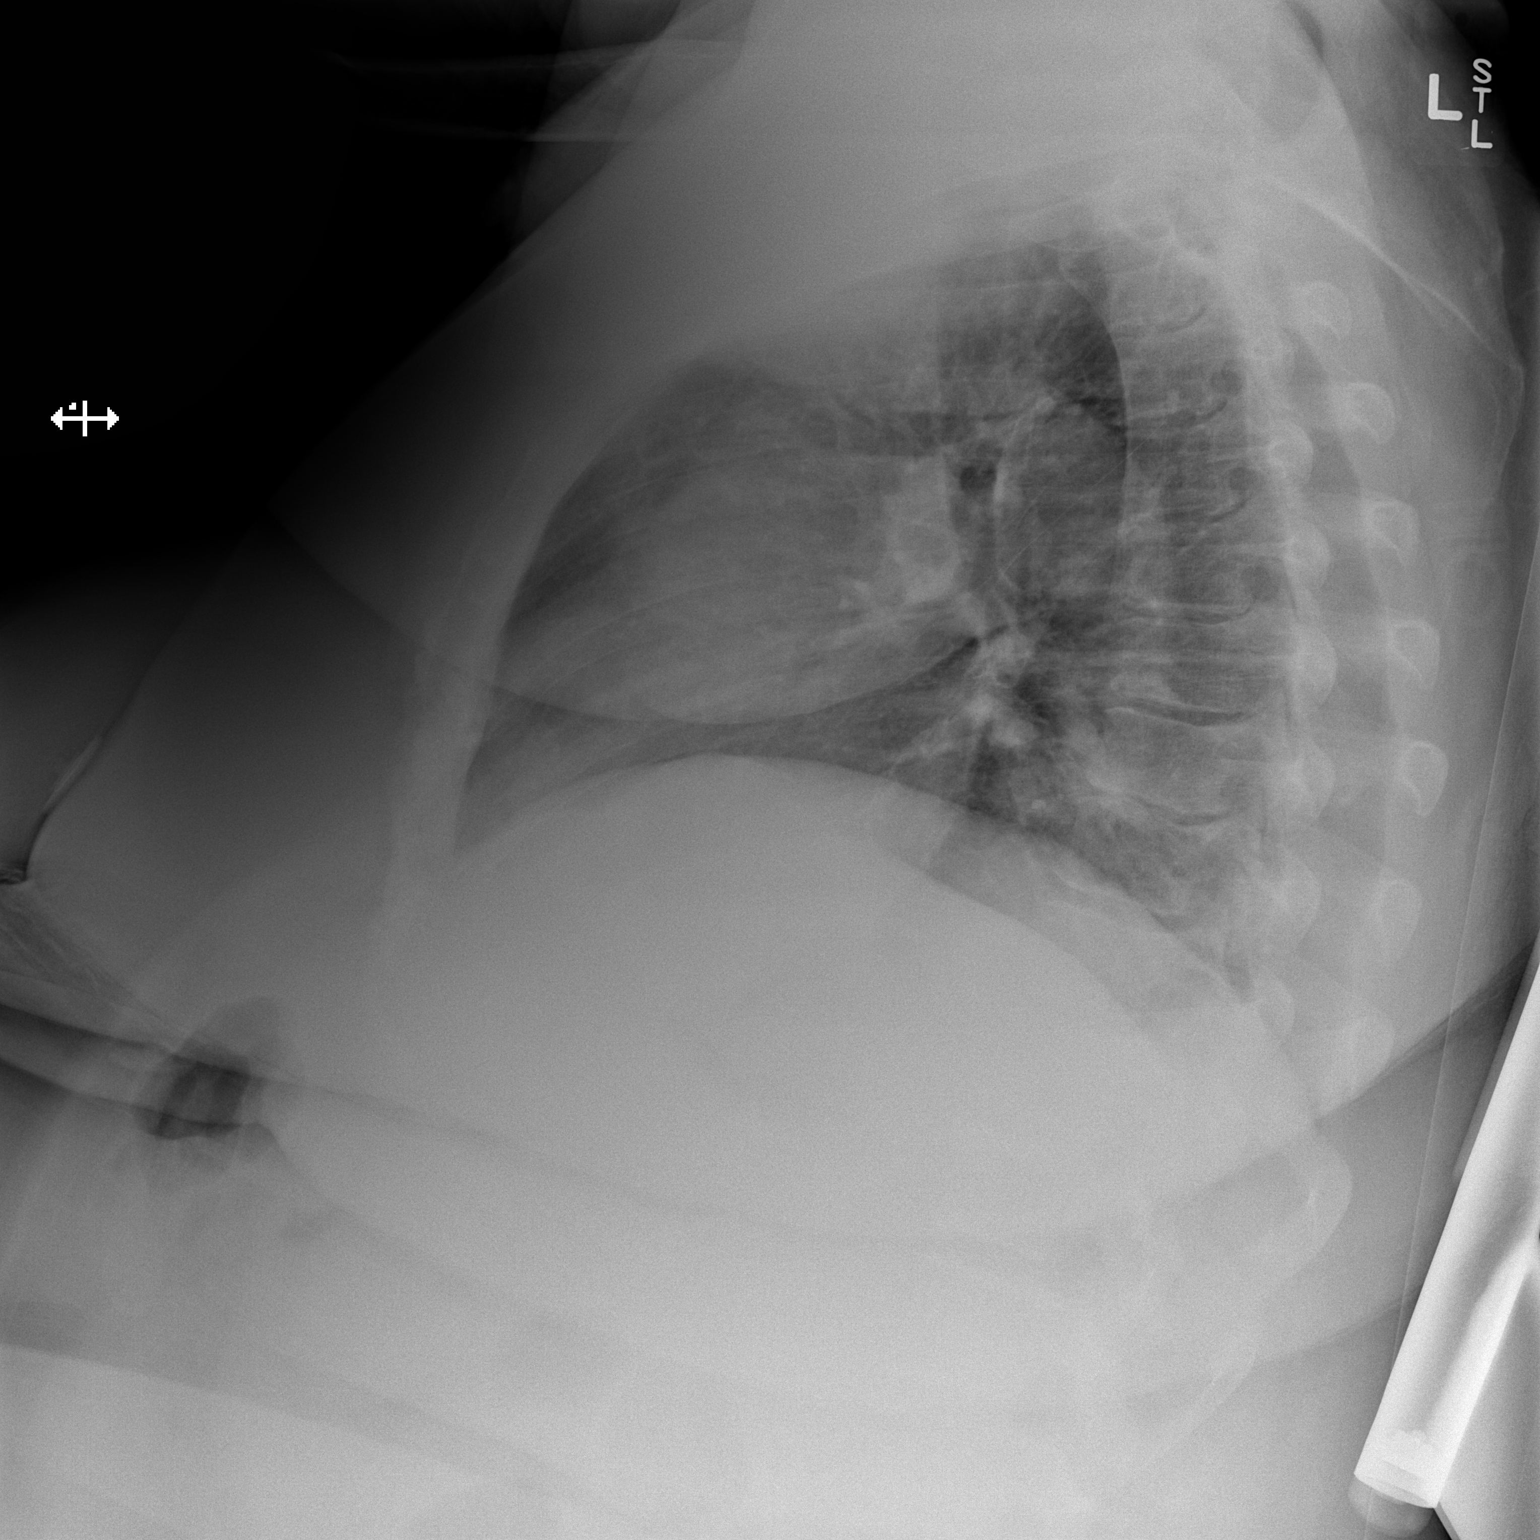

[x chest ap]
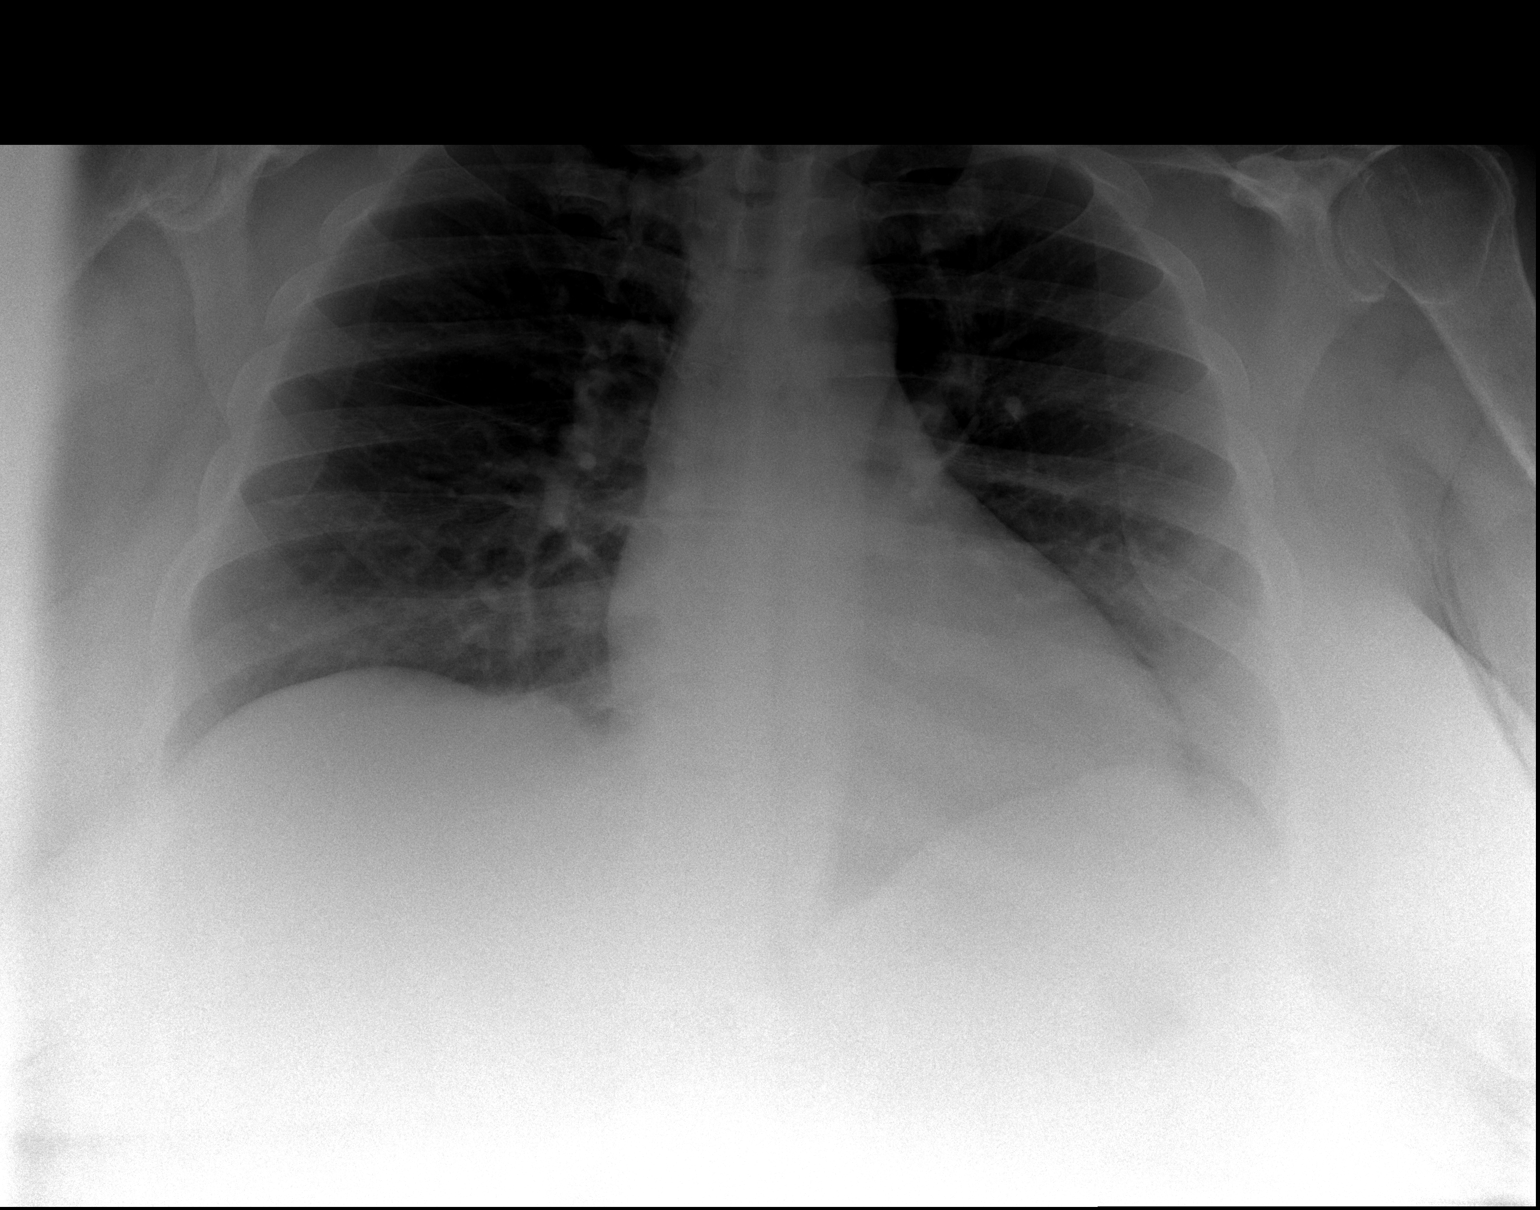

[2 of 2 positions shown; findings below may reference images not displayed]

FINDINGS: The heart size and mediastinal contours are within normal limits.
Both lungs are clear. The visualized skeletal structures are
unremarkable.
IMPRESSION: No active cardiopulmonary disease.

## 2017-07-03 IMAGING — CR DG CHEST 2V
2 series · 2 of 2 positions shown · non-contrast
Comparison: 03/14/2015

CLINICAL DATA: CP, SOB and vomiting x 2 days. Hx of DM and HTN.

EXAM:
CHEST  2 VIEW

[w chest pa]
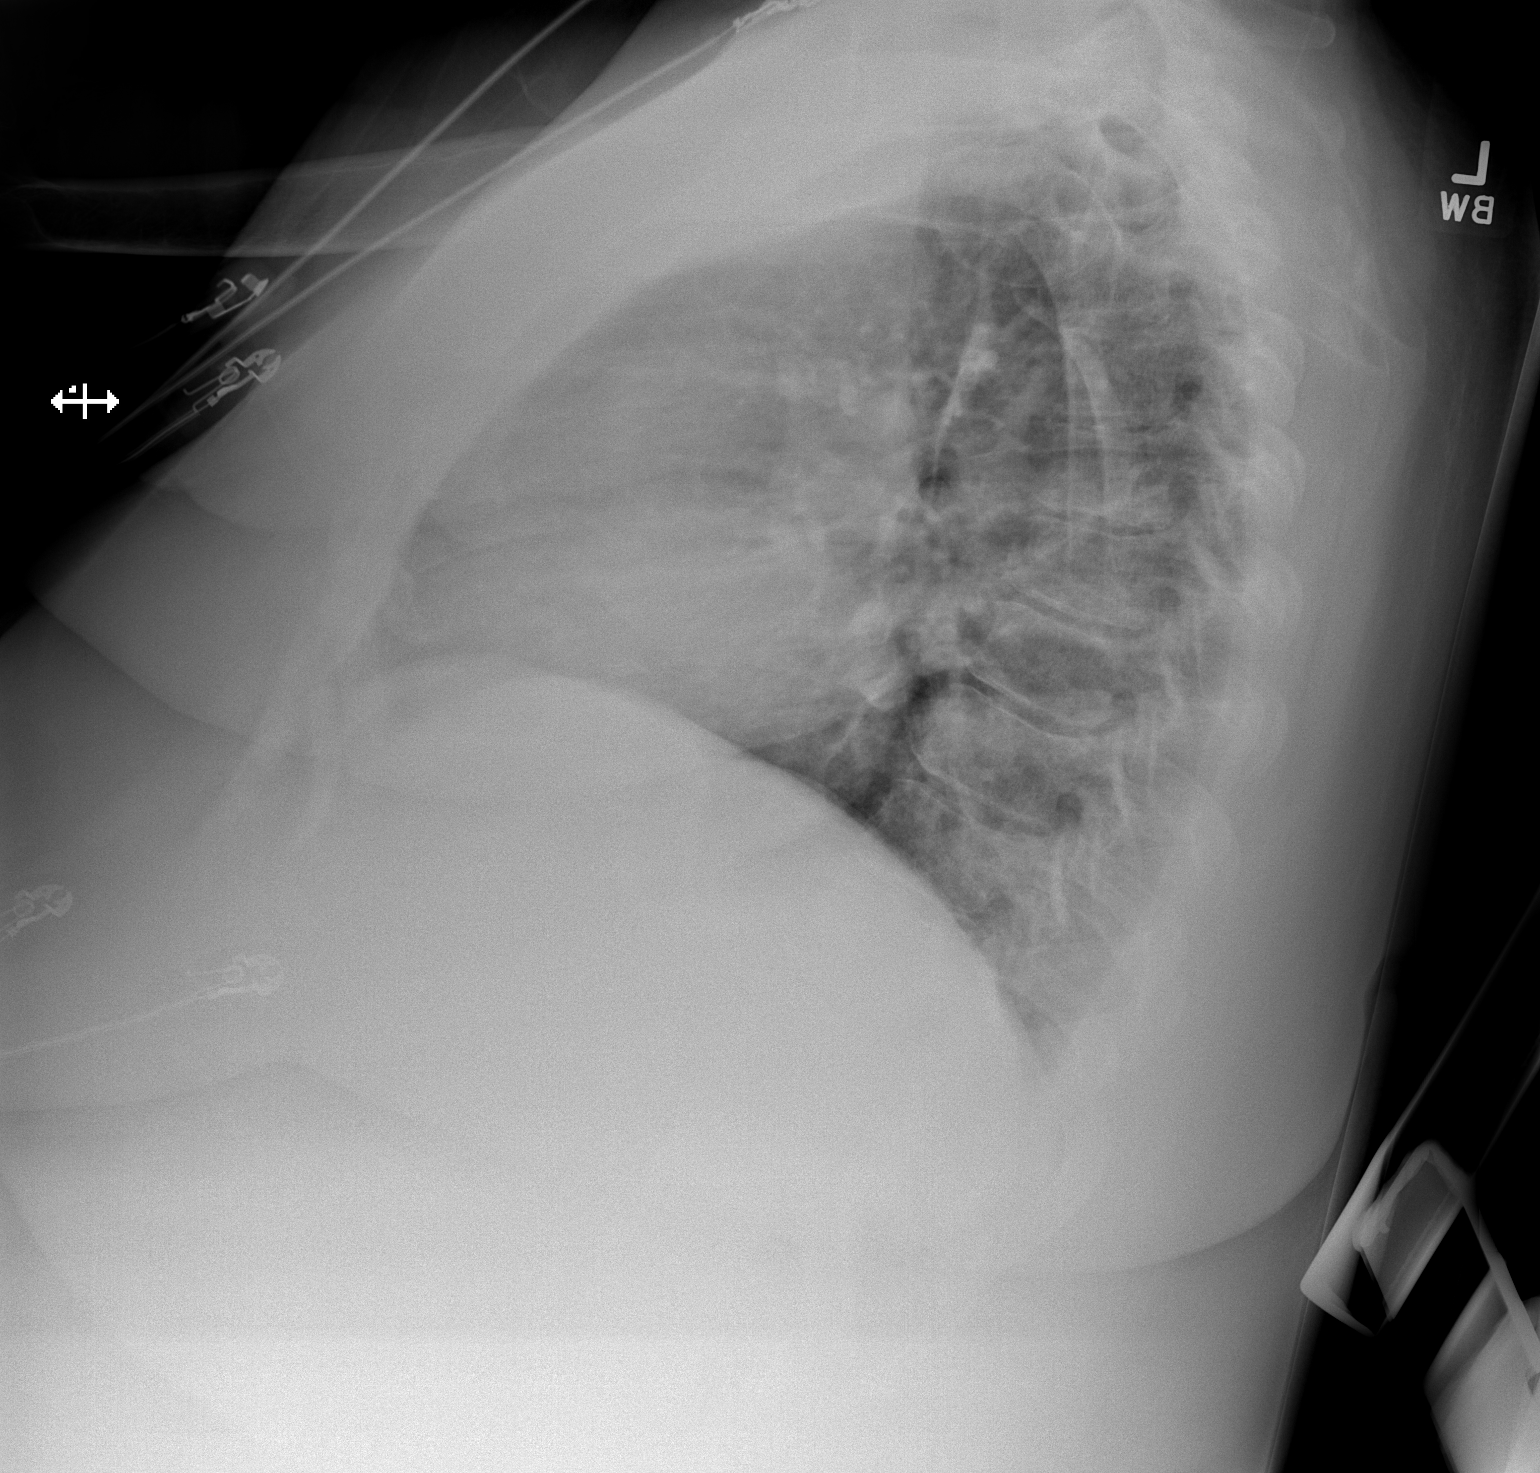

[x chest ap]
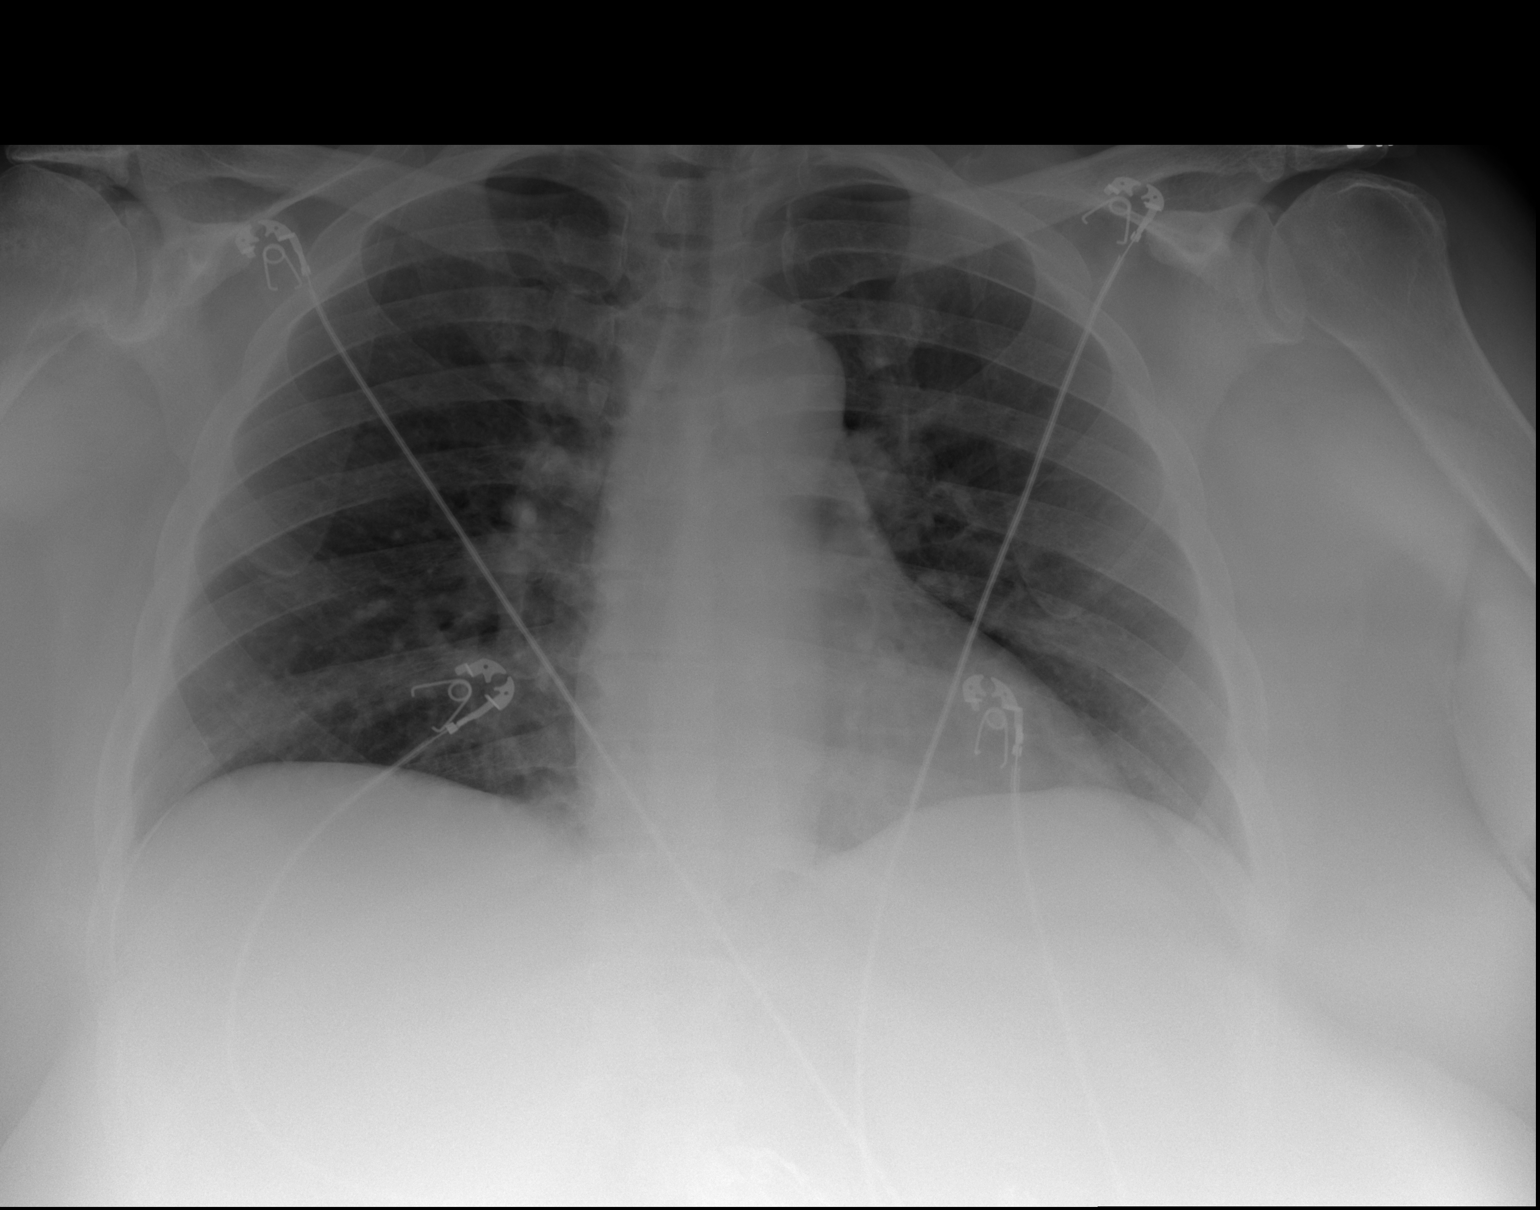

[2 of 2 positions shown; findings below may reference images not displayed]

FINDINGS: The heart size and mediastinal contours are within normal limits.
Both lungs are clear. The visualized skeletal structures are
unremarkable.
IMPRESSION: No active cardiopulmonary disease.

## 2017-07-04 IMAGING — CT CT CHEST W/ CM
2 of 5 series · 15 of 36 positions shown, 18 images · IV contrast (OMNIPAQUE)
Comparison: None.

CLINICAL DATA: Nausea and vomiting with chest pain

EXAM:
CT CHEST, ABDOMEN, AND PELVIS WITH CONTRAST
TECHNIQUE: Multidetector CT imaging of the chest, abdomen and pelvis was
performed following the standard protocol during bolus
administration of intravenous contrast.
CONTRAST:  100mL OMNIPAQUE IOHEXOL 300 MG/ML  SOLN

[Series 2: cap with st · axial · 0.84mm/px · z∈[-574,-54]mm · 12 of 122 slices shown, 15 images]
[im 9/122  mediastinal]
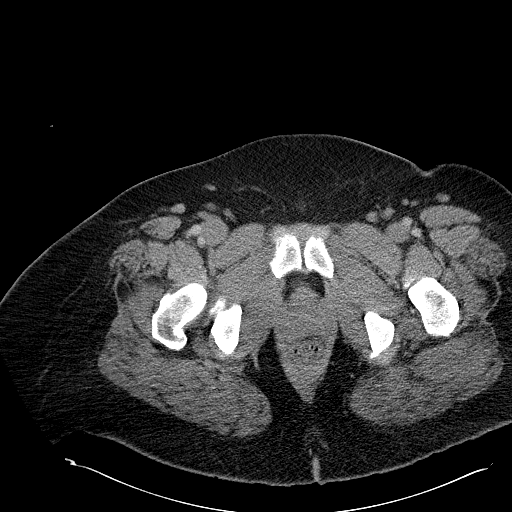
[im 9/122  lung]
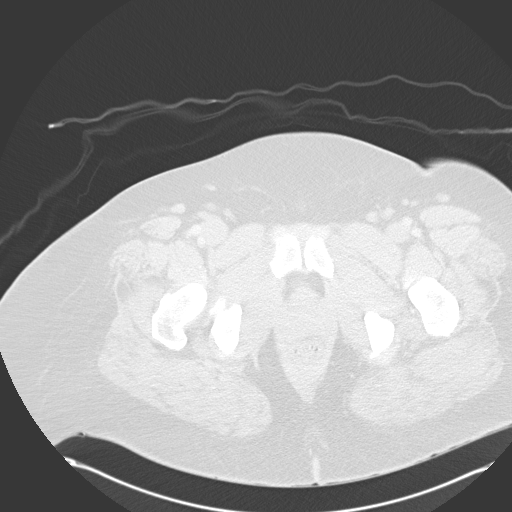
[im 17/122  lung]
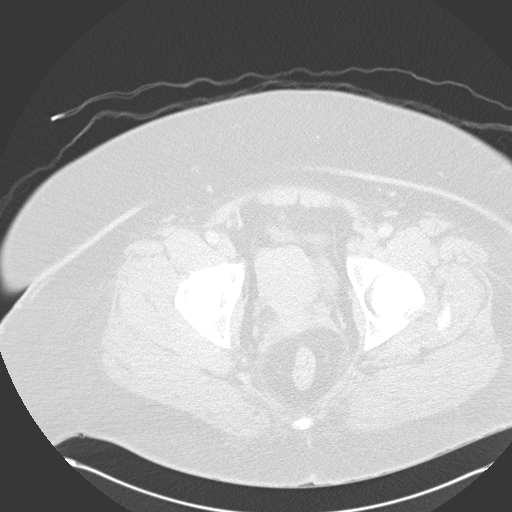
[im 25/122  lung]
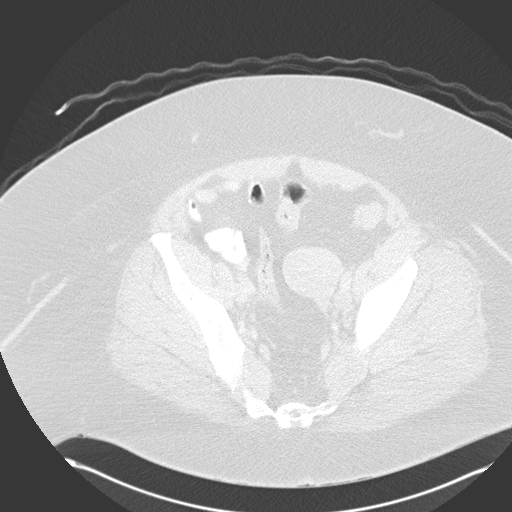
[im 41/122  lung]
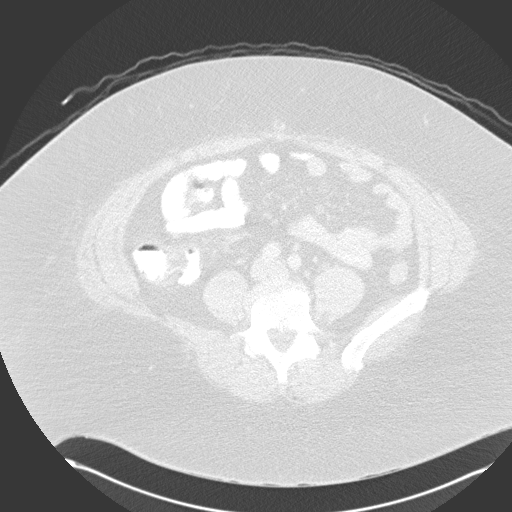
[im 49/122  mediastinal]
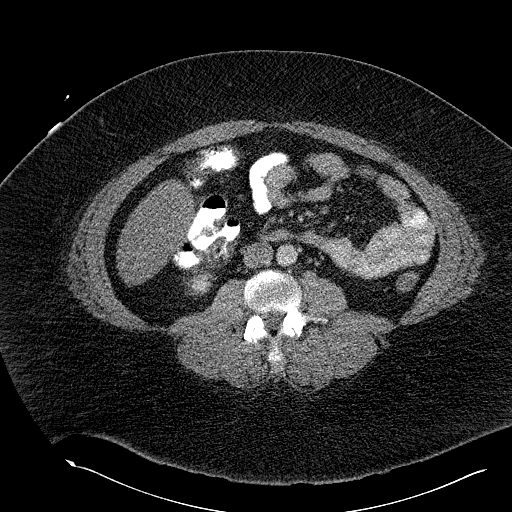
[im 49/122  lung]
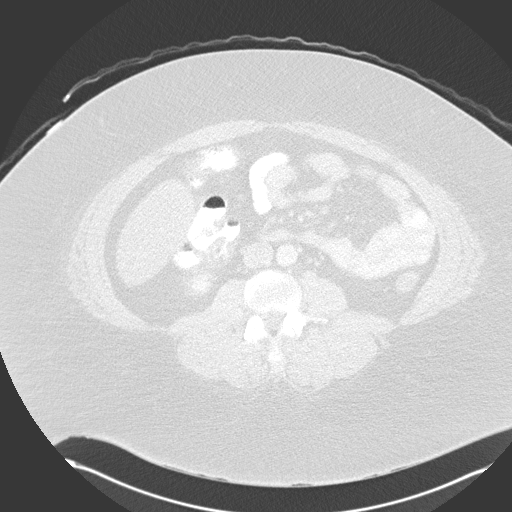
[im 57/122  lung]
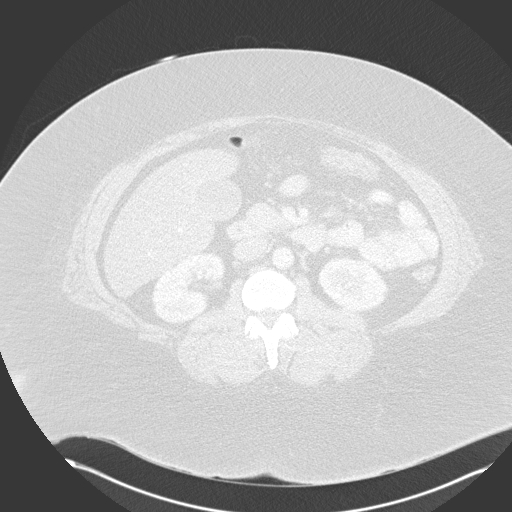
[im 65/122  lung]
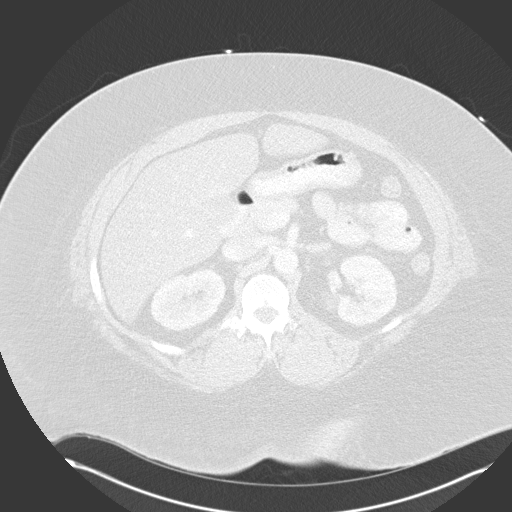
[im 73/122  lung]
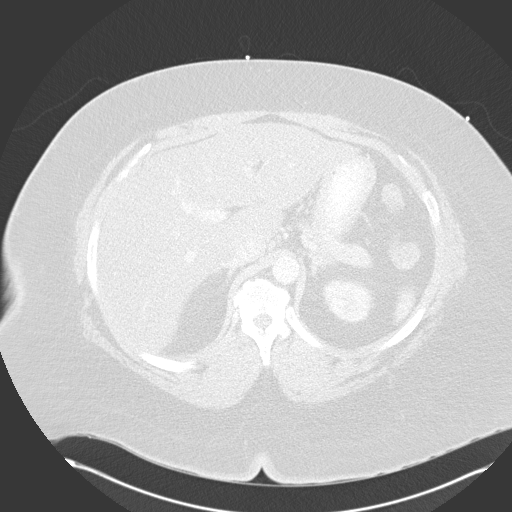
[im 81/122  mediastinal]
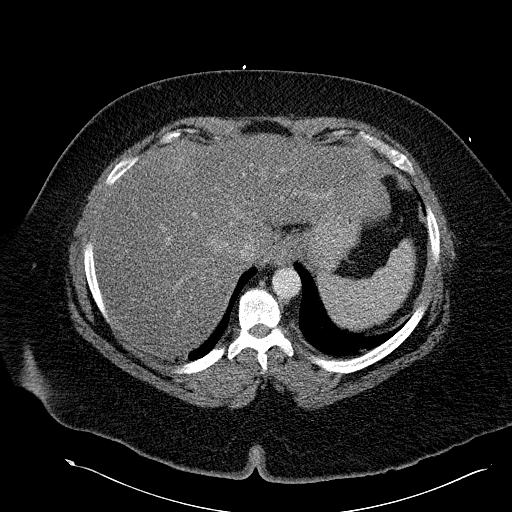
[im 81/122  lung]
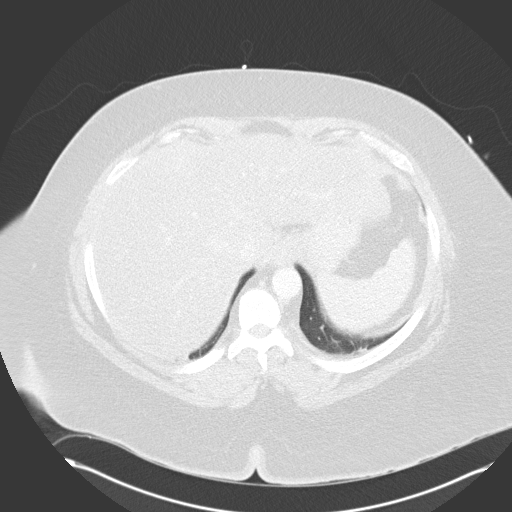
[im 97/122  lung]
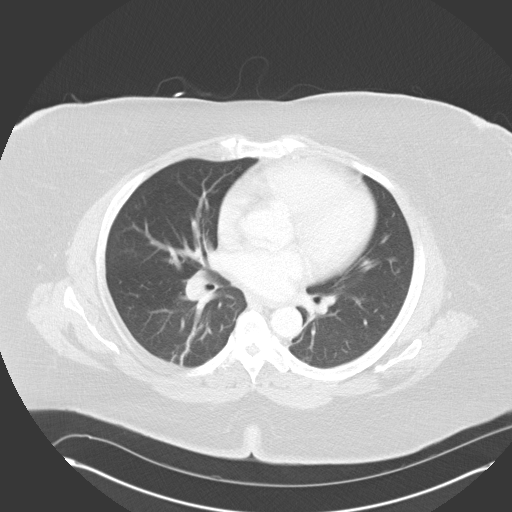
[im 105/122  lung]
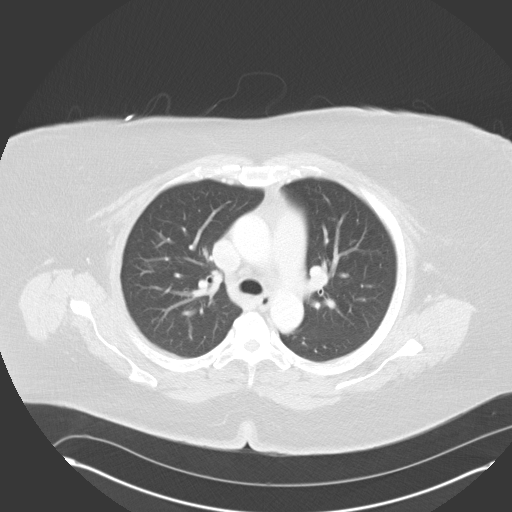
[im 113/122  lung]
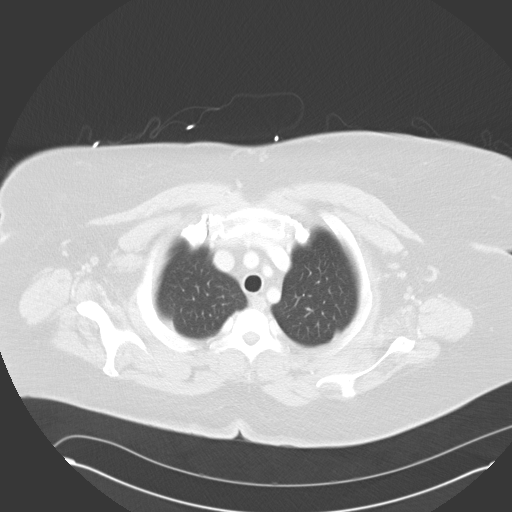

[Series 602: coronal · coronal · 1.19mm/px · 3 of 92 slices shown]
[im 19/92  lung]
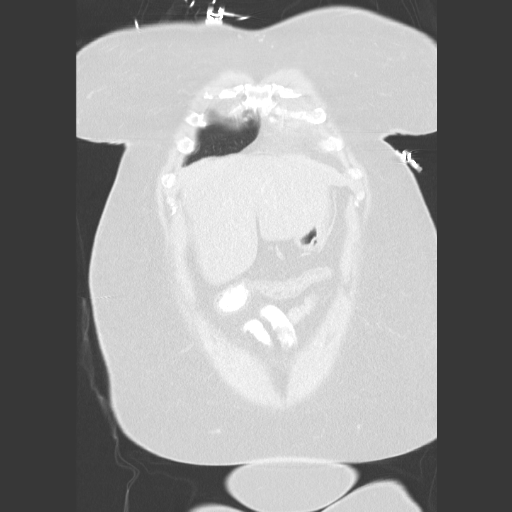
[im 37/92  lung]
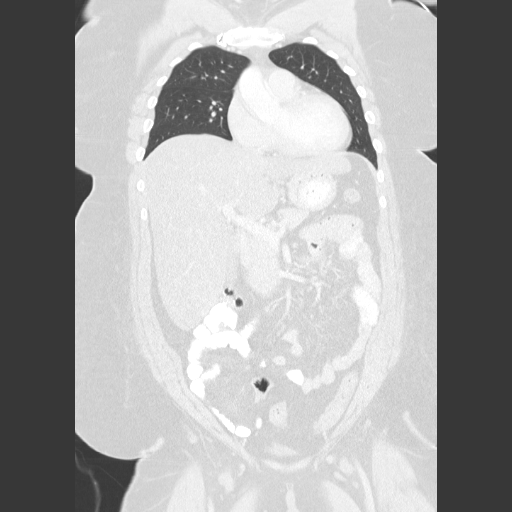
[im 55/92  lung]
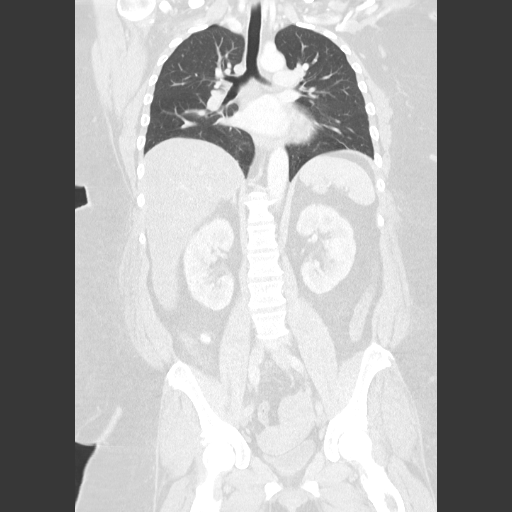

[15 of 36 positions shown; findings below may reference images not displayed]

FINDINGS: CT CHEST FINDINGS

Mediastinum/Lymph Nodes: No significant lymphadenopathy is noted.
The thoracic aorta and pulmonary artery as visualized are within
normal limits. The thoracic inlet is unremarkable.

Lungs/Pleura: The lungs are well aerated bilaterally without focal
infiltrate or sizable effusion. Mild dependent atelectatic changes
are seen.

Musculoskeletal: No chest wall mass or suspicious bone lesions
identified.

CT ABDOMEN PELVIS FINDINGS

Hepatobiliary: Fatty infiltration of the liver is noted. The
gallbladder is within normal limits.

Pancreas: No mass, inflammatory changes, or other significant
abnormality.

Spleen: Within normal limits in size and appearance.

Adrenals/Urinary Tract: No masses identified. No evidence of
hydronephrosis. Bladder is partially decompressed.

Stomach/Bowel: No evidence of obstruction, inflammatory process, or
abnormal fluid collections. The appendix is not well seen although
no inflammatory changes are noted.

Vascular/Lymphatic: No pathologically enlarged lymph nodes. No
evidence of abdominal aortic aneurysm.

Reproductive: No mass or other significant abnormality.

Other: None.

Musculoskeletal:  Degenerative changes of lumbar spine are noted.
IMPRESSION: Chronic changes as described above. No acute abnormality is noted in
the chest, abdomen and pelvis

## 2017-07-08 IMAGING — DX DG ANKLE COMPLETE 3+V*L*
3 series · 3 of 3 positions shown · non-contrast
Comparison: None.

CLINICAL DATA: Left ankle pain, no known injury, initial encounter

EXAM:
LEFT ANKLE COMPLETE - 3+ VIEW

[ankle ap]
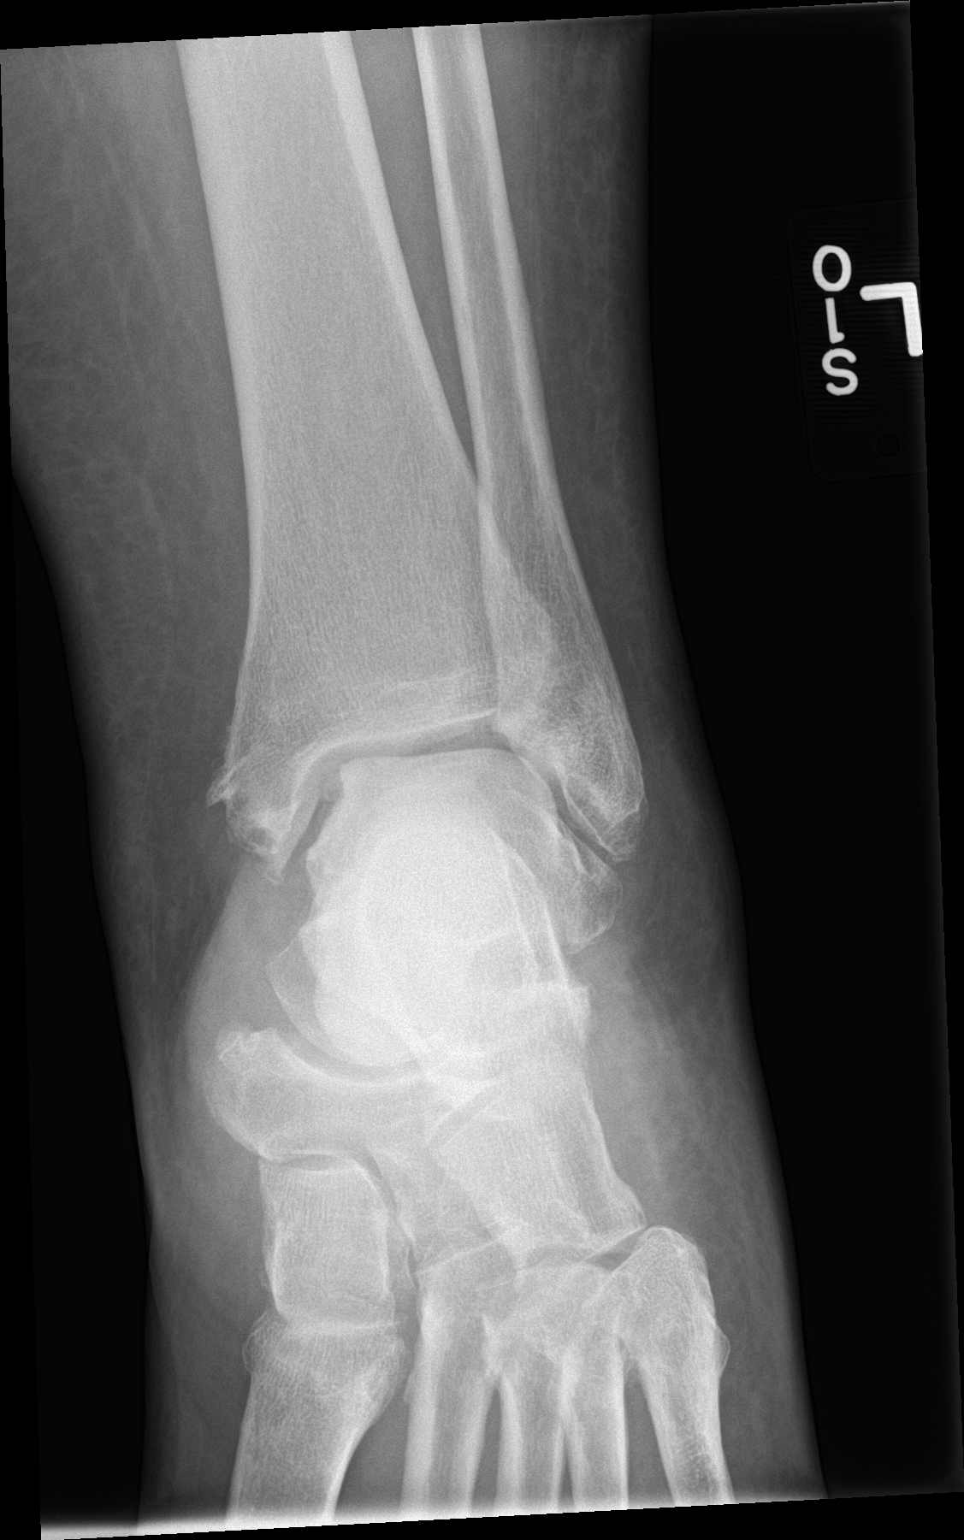

[ankle obl]
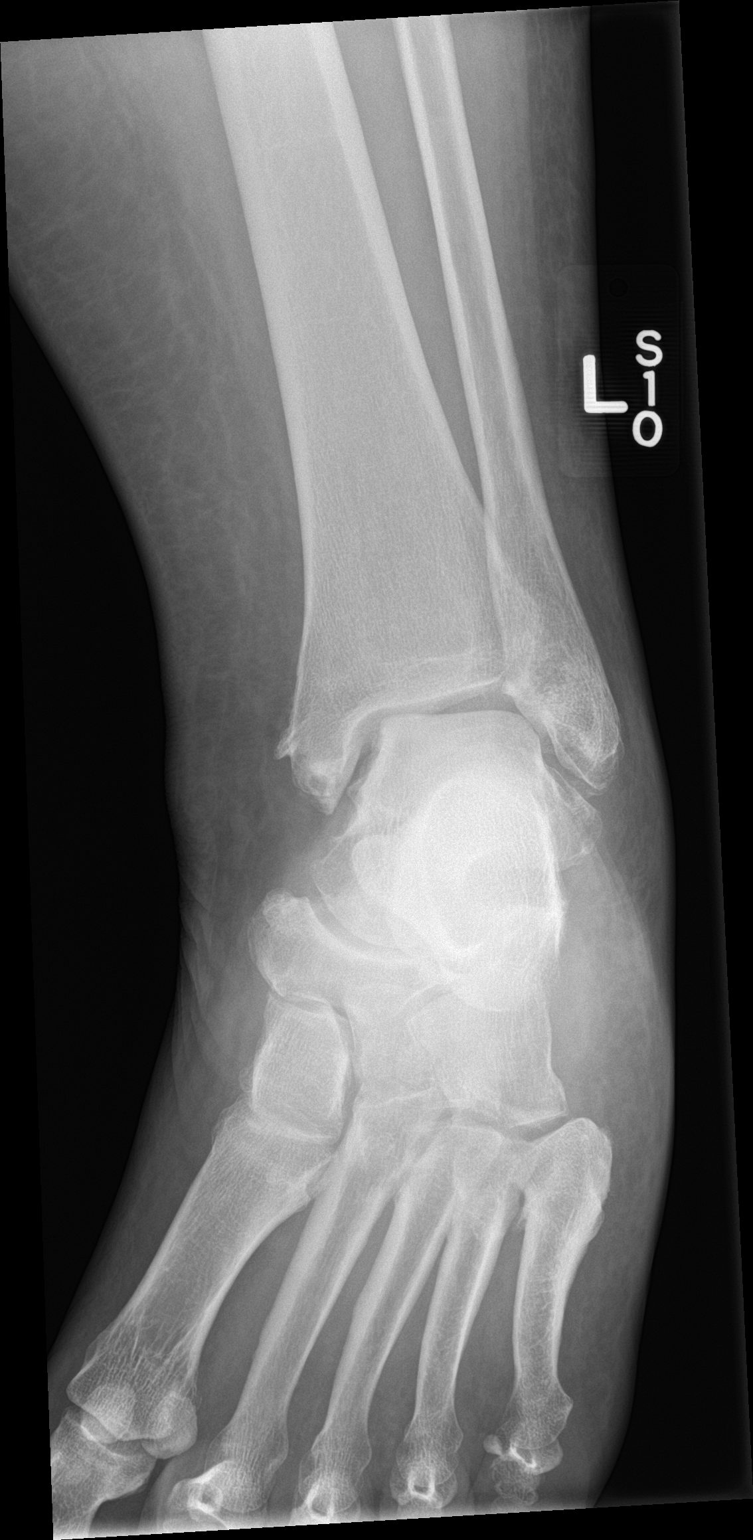

[ankle lat]
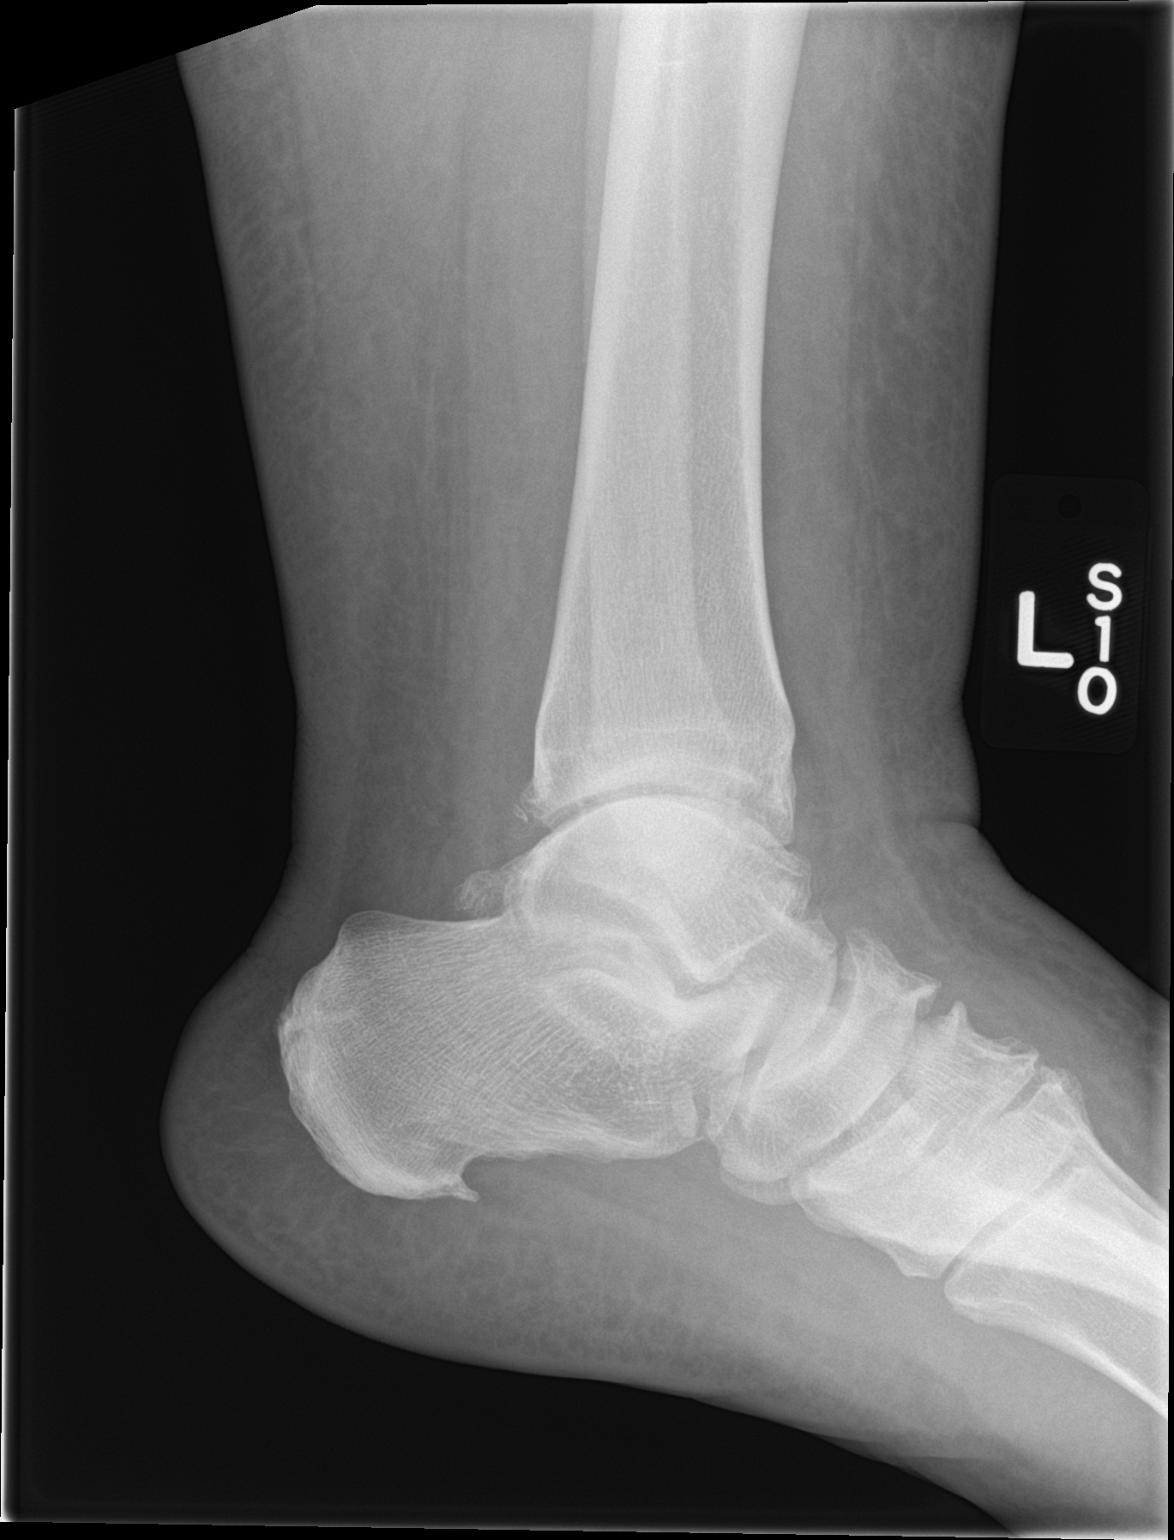

[3 of 3 positions shown; findings below may reference images not displayed]

FINDINGS: Mild degenerative changes of the tibiotalar joint are seen. No acute
fracture or dislocation is noted. Tarsal degenerative changes are
seen as well as a small calcaneal spur are.
IMPRESSION: Degenerative change without acute abnormality.

## 2017-07-08 IMAGING — DX DG FOOT COMPLETE 3+V*L*
3 series · 3 of 3 positions shown · non-contrast
Comparison: 12/11/2012

CLINICAL DATA: Left foot pain, no known injury, initial encounter

EXAM:
LEFT FOOT - COMPLETE 3+ VIEW

[foot ap]
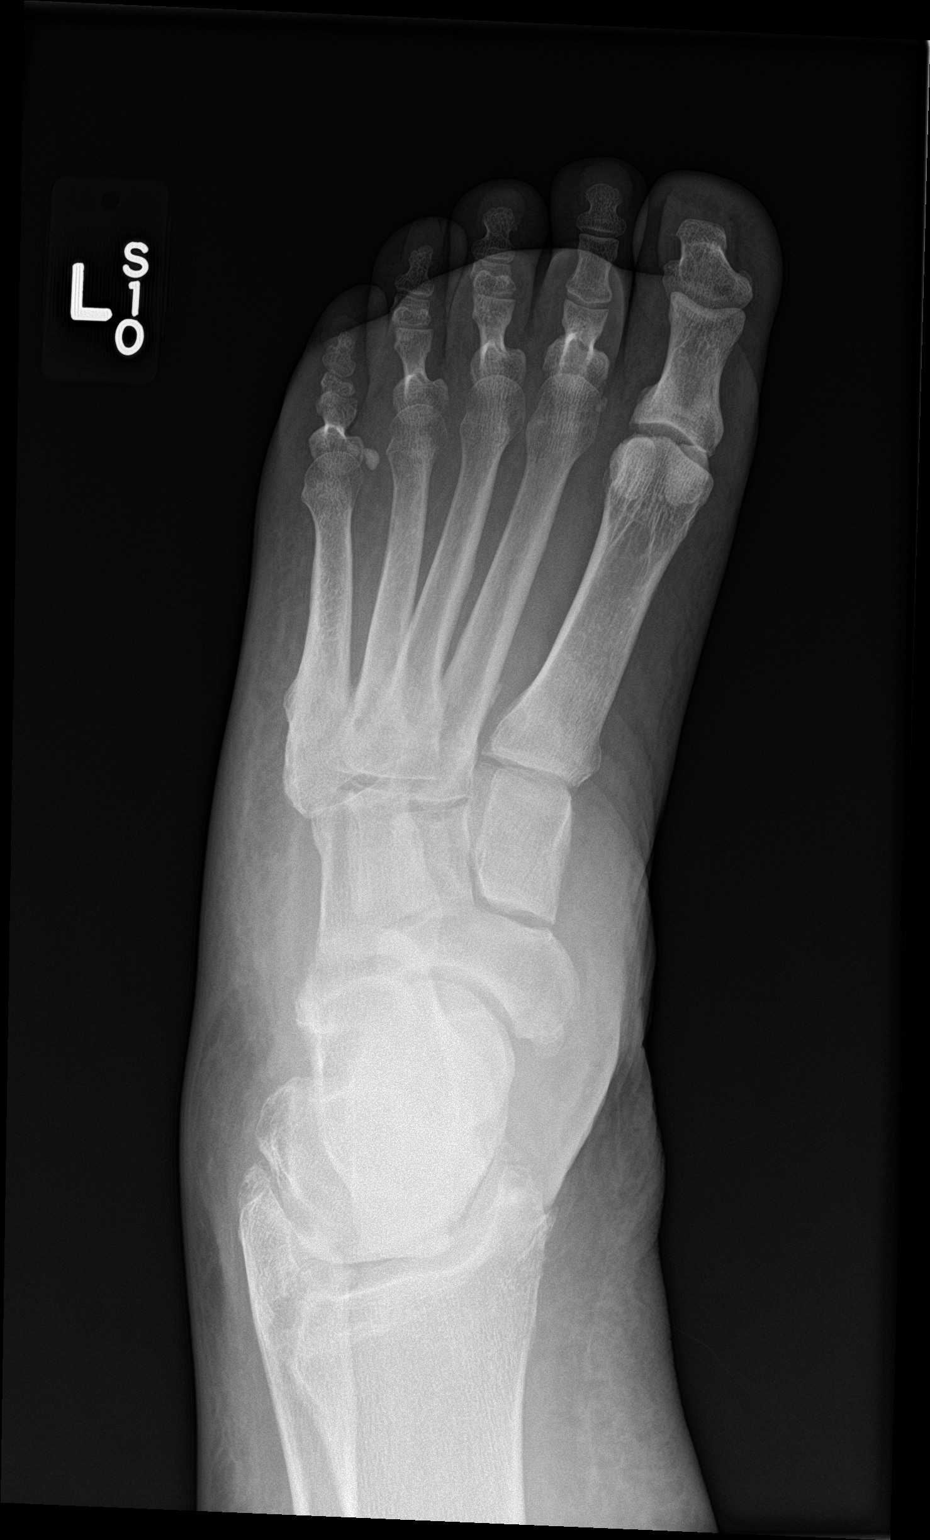

[foot obl]
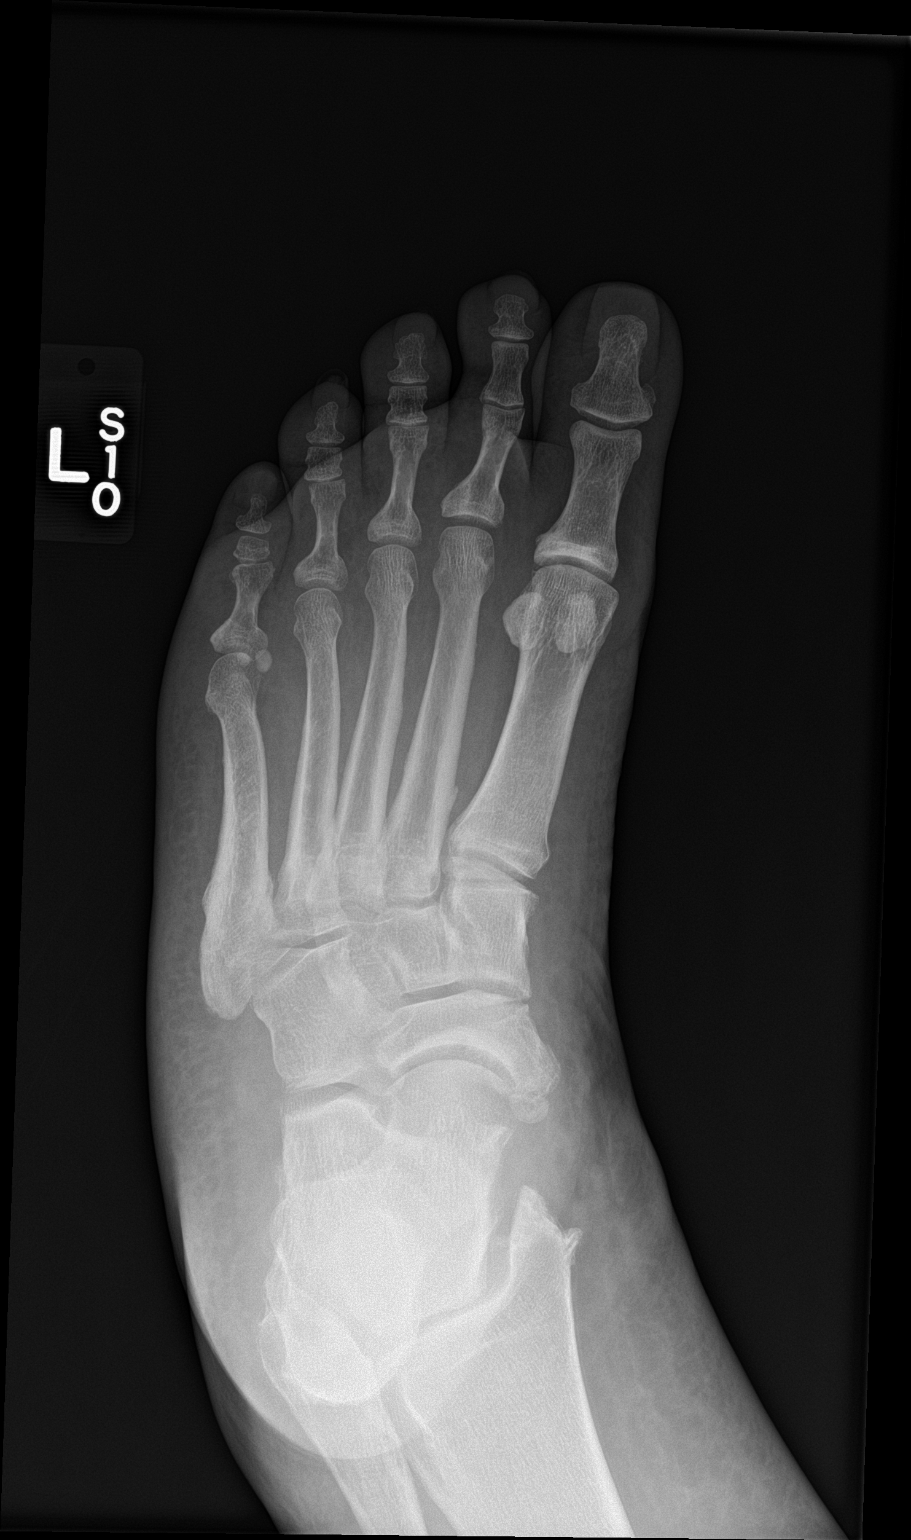

[foot lat]
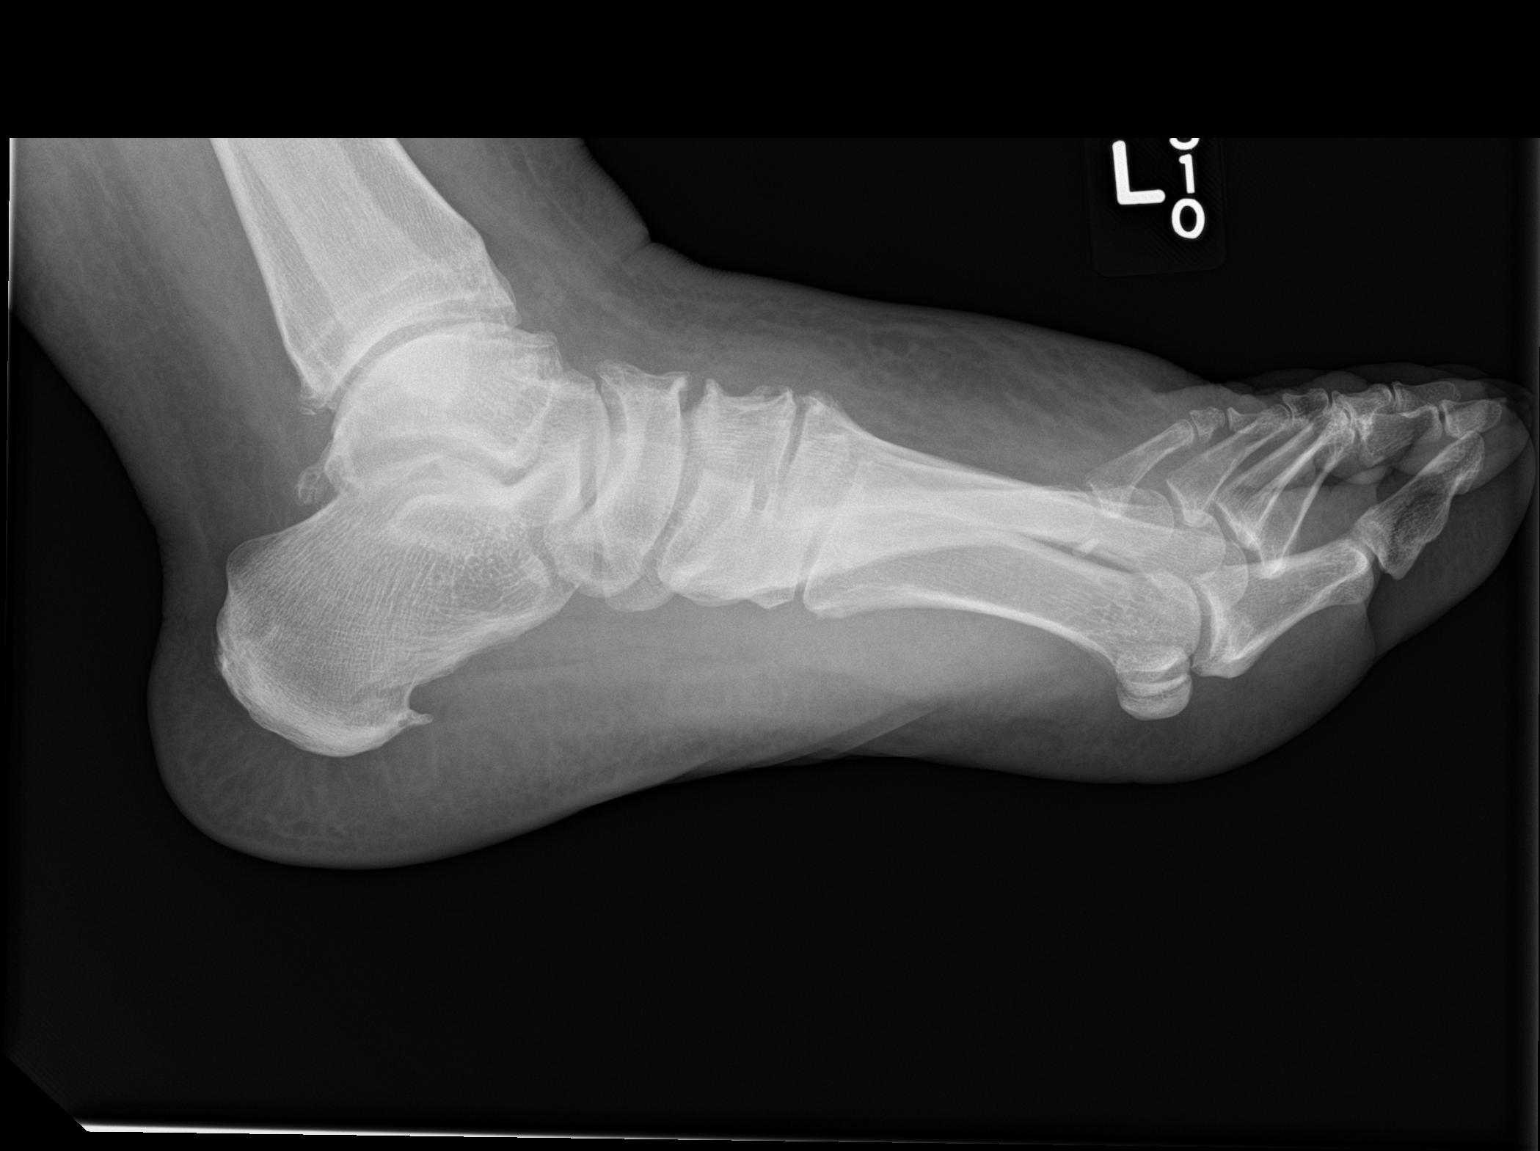

[3 of 3 positions shown; findings below may reference images not displayed]

FINDINGS: Tarsal degenerative changes are noted. No acute fracture or
dislocation is seen. Small calcaneal spur is noted.
IMPRESSION: No acute bony abnormality noted.

## 2017-07-27 ENCOUNTER — Encounter (HOSPITAL_COMMUNITY): Payer: Self-pay | Admitting: *Deleted

## 2017-07-27 ENCOUNTER — Emergency Department (HOSPITAL_COMMUNITY): Payer: Medicaid Other

## 2017-07-27 ENCOUNTER — Emergency Department (HOSPITAL_COMMUNITY)
Admission: EM | Admit: 2017-07-27 | Discharge: 2017-07-27 | Discharge status: Against Medical Advice | Payer: Medicaid Other | Attending: Emergency Medicine | Admitting: Emergency Medicine

## 2017-07-27 ENCOUNTER — Ambulatory Visit (HOSPITAL_COMMUNITY)
Admission: RE | Admit: 2017-07-27 | Discharge: 2017-07-27 | Disposition: A | Discharge status: Home / Self Care | Payer: Medicaid Other | Attending: Psychiatry | Admitting: Psychiatry

## 2017-07-27 DIAGNOSIS — Z9049 Acquired absence of other specified parts of digestive tract: Secondary | ICD-10-CM | POA: Diagnosis not present

## 2017-07-27 DIAGNOSIS — Z880 Allergy status to penicillin: Secondary | ICD-10-CM | POA: Insufficient documentation

## 2017-07-27 DIAGNOSIS — E669 Obesity, unspecified: Secondary | ICD-10-CM | POA: Insufficient documentation

## 2017-07-27 DIAGNOSIS — Z886 Allergy status to analgesic agent status: Secondary | ICD-10-CM | POA: Insufficient documentation

## 2017-07-27 DIAGNOSIS — Z833 Family history of diabetes mellitus: Secondary | ICD-10-CM | POA: Insufficient documentation

## 2017-07-27 DIAGNOSIS — I10 Essential (primary) hypertension: Secondary | ICD-10-CM | POA: Insufficient documentation

## 2017-07-27 DIAGNOSIS — F419 Anxiety disorder, unspecified: Secondary | ICD-10-CM | POA: Insufficient documentation

## 2017-07-27 DIAGNOSIS — Z8249 Family history of ischemic heart disease and other diseases of the circulatory system: Secondary | ICD-10-CM | POA: Insufficient documentation

## 2017-07-27 DIAGNOSIS — Z8711 Personal history of peptic ulcer disease: Secondary | ICD-10-CM | POA: Diagnosis not present

## 2017-07-27 DIAGNOSIS — M797 Fibromyalgia: Secondary | ICD-10-CM | POA: Diagnosis not present

## 2017-07-27 DIAGNOSIS — Z888 Allergy status to other drugs, medicaments and biological substances status: Secondary | ICD-10-CM | POA: Diagnosis not present

## 2017-07-27 DIAGNOSIS — K3184 Gastroparesis: Secondary | ICD-10-CM | POA: Insufficient documentation

## 2017-07-27 DIAGNOSIS — R0789 Other chest pain: Secondary | ICD-10-CM | POA: Diagnosis present

## 2017-07-27 DIAGNOSIS — E785 Hyperlipidemia, unspecified: Secondary | ICD-10-CM | POA: Insufficient documentation

## 2017-07-27 DIAGNOSIS — E1143 Type 2 diabetes mellitus with diabetic autonomic (poly)neuropathy: Secondary | ICD-10-CM | POA: Diagnosis not present

## 2017-07-27 DIAGNOSIS — Z91018 Allergy to other foods: Secondary | ICD-10-CM | POA: Diagnosis not present

## 2017-07-27 DIAGNOSIS — Z8673 Personal history of transient ischemic attack (TIA), and cerebral infarction without residual deficits: Secondary | ICD-10-CM | POA: Diagnosis not present

## 2017-07-27 DIAGNOSIS — Z5321 Procedure and treatment not carried out due to patient leaving prior to being seen by health care provider: Secondary | ICD-10-CM | POA: Insufficient documentation

## 2017-07-27 DIAGNOSIS — F331 Major depressive disorder, recurrent, moderate: Secondary | ICD-10-CM | POA: Diagnosis not present

## 2017-07-27 DIAGNOSIS — M109 Gout, unspecified: Secondary | ICD-10-CM | POA: Insufficient documentation

## 2017-07-27 LAB — COMPREHENSIVE METABOLIC PANEL
ALBUMIN: 3.6 g/dL (ref 3.5–5.0)
ALT: 13 U/L — ABNORMAL LOW (ref 14–54)
AST: 17 U/L (ref 15–41)
Alkaline Phosphatase: 94 U/L (ref 38–126)
Anion gap: 13 (ref 5–15)
BUN: 21 mg/dL — AB (ref 6–20)
CHLORIDE: 104 mmol/L (ref 101–111)
CO2: 22 mmol/L (ref 22–32)
Calcium: 9.1 mg/dL (ref 8.9–10.3)
Creatinine, Ser: 1.38 mg/dL — ABNORMAL HIGH (ref 0.44–1.00)
GFR calc Af Amer: 50 mL/min — ABNORMAL LOW (ref >60)
GFR calc non Af Amer: 43 mL/min — ABNORMAL LOW (ref >60)
GLUCOSE: 196 mg/dL — AB (ref 65–99)
POTASSIUM: 3 mmol/L — AB (ref 3.5–5.1)
SODIUM: 139 mmol/L (ref 135–145)
Total Bilirubin: 0.6 mg/dL (ref 0.3–1.2)
Total Protein: 8.1 g/dL (ref 6.5–8.1)

## 2017-07-27 LAB — CBC
HEMATOCRIT: 33.1 % — AB (ref 36.0–46.0)
Hemoglobin: 10.6 g/dL — ABNORMAL LOW (ref 12.0–15.0)
MCH: 27.9 pg (ref 26.0–34.0)
MCHC: 32 g/dL (ref 30.0–36.0)
MCV: 87.1 fL (ref 78.0–100.0)
Platelets: 439 10*3/uL — ABNORMAL HIGH (ref 150–400)
RBC: 3.8 MIL/uL — ABNORMAL LOW (ref 3.87–5.11)
RDW: 14.6 % (ref 11.5–15.5)
WBC: 11.8 10*3/uL — AB (ref 4.0–10.5)

## 2017-07-27 LAB — LIPASE, BLOOD: LIPASE: 28 U/L (ref 11–51)

## 2017-07-27 LAB — I-STAT TROPONIN, ED: Troponin i, poc: 0 ng/mL (ref 0.00–0.08)

## 2017-07-27 NOTE — ED Notes (Signed)
No answer when called for Xray

## 2017-07-27 NOTE — H&P (Signed)
Behavioral Health Medical Screening Exam  Deborah [name] is an 52 y.o. female patient presents to [facility] as walk in with complaints of depression and anxiety.  Patient states that she has had some passive suicidal thoughts "Don't care if don't wake up; I'm just tired."  Patient states biggest stressors is her health condition and not having a job.  Patient denies suicidal/self-harm/homicidal ideation, psychosis, and paranoia.  States that she is looking for resources for outpatient services  Total Time spent with patient: 30 minutes  Psychiatric Specialty Exam: Physical Exam  Constitutional: She is oriented to person, place, and time.  HENT:  Head: Normocephalic.  Neck: Normal range of motion. Neck supple.  Respiratory: Effort normal.  Musculoskeletal: Normal range of motion.  Using a wheel chair for ambulation (moving forward by using her feet instead of pushing)  Neurological: She is alert and oriented to person, place, and time.  Skin: Skin is warm and dry.    Review of Systems  Cardiovascular: Negative for chest pain, palpitations, orthopnea and claudication.  Gastrointestinal: Negative for abdominal pain, constipation and diarrhea.  Musculoskeletal: Positive for joint pain and myalgias.  Psychiatric/Behavioral: Depression: Reports some depression (Stable) Hallucinations: Denies. Memory loss: Denies. Substance abuse: Denies. Suicidal ideas: States she has had some passive suicidal thoughts but don't want to kill hereslf. Nervous/anxious: States that she does have anxiety and has been prescribed Xanax for it. Insomnia: Denies.     Blood pressure (!) 141/85, pulse 100, temperature 97.7 F (36.5 C), resp. rate 18, last menstrual period 10/10/2012, SpO2 100 %.There is no height or weight on file to calculate BMI.  General Appearance: Casual  Eye Contact:  Good  Speech:  Clear and Coherent and Normal Rate  Volume:  Normal  Mood:  Appropriate  Affect:  Appropriate and Congruent   Thought Process:  Coherent and Goal Directed  Orientation:  Full (Time, Place, and Person)  Thought Content:  Logical  Suicidal Thoughts:  No  Homicidal Thoughts:  No  Memory:  Immediate;   Good Recent;   Good Remote;   Good  Judgement:  Intact  Insight:  Present  Psychomotor Activity:  Normal  Concentration: Concentration: Good and Attention Span: Good  Recall:  Good  Fund of Knowledge:Fair  Language: Good  Akathisia:  No  Handed:  Right  AIMS (if indicated):     Assets:  Communication Skills Desire for Improvement Housing Social Support  Sleep:       Musculoskeletal: Strength & Muscle Tone: within normal limits Gait & Station: Did not see patient ambulate; using wheel chair Patient leans: N/A  Blood pressure (!) 141/85, pulse 100, temperature 97.7 F (36.5 C), resp. rate 18, last menstrual period 10/10/2012, SpO2 100 %.  Recommendations:  Outpatient psychiatric services.  Patient given referral/resources for outpatient services [facility] [place]) for walk in  Disposition: No evidence of imminent risk to self or others at present.   Patient does not meet criteria for psychiatric inpatient admission.   Based on my evaluation the patient does not appear to have an emergency medical condition.  Shuvon Rankin, NP 07/27/2017, 4:39 PM

## 2017-07-27 NOTE — BH Assessment (Signed)
Assessment Note  Deborah [name] is a separated 52 y.o. female who presented voluntarily to [facility] for a walk-in assessment accompanied by her mother. Pt is  reporting symptoms of depression and of passive suicidal ideation this morning. Pt has multiple medical problems which have interfered with her mobility and employment- both contributing to her feelings of depression. Pt reports she lived in a nursing home for 4 months doing rehab for her legs. She states she was talking xanax for anxiety while a resident there. Pt reports having problems with homelessness and now is living with her mother in section 8 housing.  Pt denies current or past suicidal plan. She denies HI, AVH. & history of violence.  Pt denies family hx of MH problems or suicide. Pt denies previous outpt or inpt MH tx. She denies hx substance abuse. Pt given information for outpt tx resources, including [facility] [place].    Diagnosis:   F33.1 Major depressive disorder, Recurrent episode, Moderate Disposition: Per Shuvon Rankin, NP, Pt to f/u with outpt MH tx   Past Medical History:  Past Medical History:  Diagnosis Date  . Chest pain 12/2015  . Diabetes mellitus   . Fibromyalgia   . Gastric ulcer   . Gastroparesis   . Gout   . Hyperlipidemia   . Hypertension   . Obesity   . Pneumonia   . Stroke [facility]) 02/2011    Past Surgical History:  Procedure Laterality Date  . CATARACT EXTRACTION  01/2014  . CHOLECYSTECTOMY      Family History:  Family History  Problem Relation Age of Onset  . Diabetes Mother   . Diabetes Father   . Heart disease Father   . Diabetes Sister   . Congestive Heart Failure Sister 36  . Diabetes Brother     Social History:  reports that she has never smoked. She has never used smokeless tobacco. She reports that she does not drink alcohol or use drugs.  Additional Social History:  Alcohol / Drug Use Pain Medications: see MAR Prescriptions: see MAR Over the Counter: see  MAR History of alcohol / drug use?: No history of alcohol / drug abuse  CIWA: CIWA-Ar BP: (!) 141/85 Pulse Rate: 100 COWS:    Allergies:  Allergies  Allergen Reactions  . Diazepam [name]  . Lisinopril Anaphylaxis    Tongue and mouth swelling  . Penicillins Palpitations    Has patient had a PCN reaction causing immediate rash, facial/tongue/throat swelling, SOB or lightheadedness with hypotension: Yes, heart races Has patient had a PCN reaction causing severe rash involving mucus membranes or skin necrosis: No Has patient had a PCN reaction that required hospitalization: Yes  Has patient had a PCN reaction occurring within the last 10 years: No   . Acetaminophen Other (See Comments) and Nausea Only    Irritates stomach ulcer  Irritates stomach ulcer  Abdominal pain  . Tolmetin Nausea Only    Other reaction(s): Other (See Comments) ULCER ULCER  . Aspirin Other (See Comments)    Irritates stomach ulcer   . Food Hives and Swelling    Carrots, ketchup   . Nsaids Other (See Comments)    ULCER  . Tramadol Nausea And Vomiting    Home Medications:  (Not in a hospital admission)  OB/GYN Status:  Patient's last menstrual period was 10/10/2012.  General Assessment Data Location of Assessment: [facility] Assessment Services TTS Assessment: In system Is this a Tele or Face-to-Face Assessment?: Face-to-Face Is this an  Initial Assessment or a Re-assessment for this encounter?: Initial Assessment Marital status: Separated Maiden name: Barrales Is patient pregnant?: Unknown Pregnancy Status: Unknown Living Arrangements: Parent Can pt return to current living arrangement?: Yes Admission Status: Voluntary Is patient capable of signing voluntary admission?: Yes Referral Source: Self/Family/Friend Insurance type: none  Medical Screening Exam ([place]) Medical Exam completed: Yes  Crisis Care Plan Living Arrangements: Parent Name of Psychiatrist: none Name of  Therapist: none  Education Status Is patient currently in school?: No Is the patient employed, unemployed or receiving disability?: Unemployed  Risk to self with the past 6 months Suicidal Ideation: No-Not Currently/Within Last 6 Months(passive, earlier today) Has patient been a risk to self within the past 6 months prior to admission? : No Suicidal Intent: No Has patient had any suicidal intent within the past 6 months prior to admission? : No Is patient at risk for suicide?: No Suicidal Plan?: No Has patient had any suicidal plan within the past 6 months prior to admission? : No Access to Means: No What has been your use of drugs/alcohol within the last 12 months?: denies Previous Attempts/Gestures: No Other Self Harm Risks: denies Intentional Self Injurious Behavior: None Family Suicide History: No Recent stressful life event(s): Loss (Comment)(loss of mobility & employment) Persecutory voices/beliefs?: No Depression Symptoms: Despondent, Tearfulness, Isolating, Fatigue, Guilt, Loss of interest in usual pleasures, Feeling worthless/self pity, Feeling angry/irritable Substance abuse history and/or treatment for substance abuse?: No Suicide prevention information given to non-admitted patients: Yes  Risk to Others within the past 6 months Homicidal Ideation: No Does patient have any lifetime risk of violence toward others beyond the six months prior to admission? : No Does patient have access to weapons?: No Criminal Charges Pending?: No Does patient have a court date: No Is patient on probation?: No  Psychosis Hallucinations: None noted Delusions: None noted  Mental Status Report Appearance/Hygiene: Disheveled Eye Contact: Fair Motor Activity: Freedom of movement Speech: Logical/coherent, Unremarkable Level of Consciousness: Alert Mood: Depressed Affect: Depressed Anxiety Level: Minimal Thought Processes: Coherent Judgement: Unimpaired Orientation: Person, Place,  Time, Situation, Appropriate for developmental age Obsessive Compulsive Thoughts/Behaviors: None  Cognitive Functioning Concentration: Normal Memory: Recent Intact, Remote Intact Is patient IDD: No Insight: Fair Impulse Control: Fair Appetite: Good Have you had any weight changes? : Gain Amount of the weight change? (lbs): 30 lbs Sleep: Increased Total Hours of Sleep: 12  ADLScreening [facility] Assessment Services) Patient's cognitive ability adequate to safely complete daily activities?: Yes Patient able to express need for assistance with ADLs?: Yes Independently performs ADLs?: Yes (appropriate for developmental age)  Prior Inpatient Therapy Prior Inpatient Therapy: No  Prior Outpatient Therapy Prior Outpatient Therapy: No Does patient have an ACCT team?: No Does patient have Intensive In-House Services?  : No Does patient have [facility] services? : No Does patient have P4CC services?: No  ADL Screening (condition at time of admission) Patient's cognitive ability adequate to safely complete daily activities?: Yes Is the patient deaf or have difficulty hearing?: No Does the patient have difficulty seeing, even when wearing glasses/contacts?: No Does the patient have difficulty concentrating, remembering, or making decisions?: No Patient able to express need for assistance with ADLs?: Yes Independently performs ADLs?: Yes (appropriate for developmental age) Does the patient have difficulty walking or climbing stairs?: Yes Weakness of Legs: Both Weakness of Arms/Hands: None  Home Assistive Devices/Equipment Home Assistive Devices/Equipment: Wheelchair    Abuse/Neglect Assessment (Assessment to be complete while patient is alone) Abuse/Neglect Assessment Can Be Completed:  Yes Physical Abuse: Denies Verbal Abuse: Denies Sexual Abuse: Denies Exploitation of patient/patient's resources: Denies Self-Neglect: Denies Values / Beliefs Cultural Requests During Hospitalization:  None Spiritual Requests During Hospitalization: None Consults Spiritual Care Consult Needed: No Social Work Consult Needed: Yes (Comment) Advance Directives (For Healthcare) Does Patient Have a Medical Advance Directive?: No Would patient like information on creating a medical advance directive?: No - Patient declined    Additional Information 1:1 In Past 12 Months?: No CIRT Risk: No Elopement Risk: No     Disposition:  Disposition Initial Assessment Completed for this Encounter: Yes Disposition of Patient: Discharge Patient refused recommended treatment: No Mode of transportation if patient is discharged?: Car  On Site Evaluation by:   Reviewed with Physician:    Richardean Chimera 07/27/2017 5:02 PM

## 2017-07-27 NOTE — ED Triage Notes (Signed)
Pt c/o abdominal pain (sts thinks it's a gastroparesis flare up), as well as chest pain and worsening anxiety. Denies SI. Reports hx of CHF

## 2017-07-27 NOTE — ED Notes (Signed)
I have just received a call from the [facility], Ria Comment to wit: pt. Is at [facility]. Seeking a psych. eval.

## 2017-07-29 ENCOUNTER — Emergency Department (HOSPITAL_COMMUNITY): Payer: Medicaid Other

## 2017-07-29 ENCOUNTER — Other Ambulatory Visit: Payer: Self-pay

## 2017-07-29 ENCOUNTER — Inpatient Hospital Stay (HOSPITAL_COMMUNITY)
Admission: EM | Admit: 2017-07-29 | Discharge: 2017-08-02 | DRG: 074 | Disposition: A | Discharge status: Home / Self Care | Payer: Medicaid Other | Attending: Internal Medicine | Admitting: Internal Medicine

## 2017-07-29 ENCOUNTER — Encounter (HOSPITAL_COMMUNITY): Payer: Self-pay

## 2017-07-29 DIAGNOSIS — K219 Gastro-esophageal reflux disease without esophagitis: Secondary | ICD-10-CM | POA: Diagnosis present

## 2017-07-29 DIAGNOSIS — Z91018 Allergy to other foods: Secondary | ICD-10-CM

## 2017-07-29 DIAGNOSIS — M109 Gout, unspecified: Secondary | ICD-10-CM | POA: Diagnosis present

## 2017-07-29 DIAGNOSIS — E1122 Type 2 diabetes mellitus with diabetic chronic kidney disease: Secondary | ICD-10-CM | POA: Diagnosis present

## 2017-07-29 DIAGNOSIS — F329 Major depressive disorder, single episode, unspecified: Secondary | ICD-10-CM | POA: Diagnosis present

## 2017-07-29 DIAGNOSIS — Z515 Encounter for palliative care: Secondary | ICD-10-CM

## 2017-07-29 DIAGNOSIS — E1169 Type 2 diabetes mellitus with other specified complication: Secondary | ICD-10-CM

## 2017-07-29 DIAGNOSIS — N1832 Chronic kidney disease, stage 3b: Secondary | ICD-10-CM | POA: Diagnosis present

## 2017-07-29 DIAGNOSIS — I129 Hypertensive chronic kidney disease with stage 1 through stage 4 chronic kidney disease, or unspecified chronic kidney disease: Secondary | ICD-10-CM | POA: Diagnosis present

## 2017-07-29 DIAGNOSIS — E1143 Type 2 diabetes mellitus with diabetic autonomic (poly)neuropathy: Secondary | ICD-10-CM | POA: Diagnosis present

## 2017-07-29 DIAGNOSIS — E860 Dehydration: Secondary | ICD-10-CM | POA: Diagnosis present

## 2017-07-29 DIAGNOSIS — IMO0002 Reserved for concepts with insufficient information to code with codable children: Secondary | ICD-10-CM

## 2017-07-29 DIAGNOSIS — R0789 Other chest pain: Secondary | ICD-10-CM

## 2017-07-29 DIAGNOSIS — I4581 Long QT syndrome: Secondary | ICD-10-CM | POA: Diagnosis present

## 2017-07-29 DIAGNOSIS — I10 Essential (primary) hypertension: Secondary | ICD-10-CM | POA: Diagnosis present

## 2017-07-29 DIAGNOSIS — K3184 Gastroparesis: Secondary | ICD-10-CM | POA: Diagnosis present

## 2017-07-29 DIAGNOSIS — M1A9XX0 Chronic gout, unspecified, without tophus (tophi): Secondary | ICD-10-CM | POA: Diagnosis present

## 2017-07-29 DIAGNOSIS — R197 Diarrhea, unspecified: Secondary | ICD-10-CM | POA: Diagnosis present

## 2017-07-29 DIAGNOSIS — N179 Acute kidney failure, unspecified: Secondary | ICD-10-CM | POA: Diagnosis present

## 2017-07-29 DIAGNOSIS — M797 Fibromyalgia: Secondary | ICD-10-CM | POA: Diagnosis present

## 2017-07-29 DIAGNOSIS — Z885 Allergy status to narcotic agent status: Secondary | ICD-10-CM

## 2017-07-29 DIAGNOSIS — E1165 Type 2 diabetes mellitus with hyperglycemia: Secondary | ICD-10-CM

## 2017-07-29 DIAGNOSIS — Z886 Allergy status to analgesic agent status: Secondary | ICD-10-CM

## 2017-07-29 DIAGNOSIS — I639 Cerebral infarction, unspecified: Secondary | ICD-10-CM | POA: Diagnosis present

## 2017-07-29 DIAGNOSIS — N182 Chronic kidney disease, stage 2 (mild): Secondary | ICD-10-CM

## 2017-07-29 DIAGNOSIS — G8929 Other chronic pain: Secondary | ICD-10-CM | POA: Diagnosis present

## 2017-07-29 DIAGNOSIS — E876 Hypokalemia: Secondary | ICD-10-CM | POA: Diagnosis present

## 2017-07-29 DIAGNOSIS — R1084 Generalized abdominal pain: Secondary | ICD-10-CM

## 2017-07-29 DIAGNOSIS — N184 Chronic kidney disease, stage 4 (severe): Secondary | ICD-10-CM | POA: Diagnosis present

## 2017-07-29 DIAGNOSIS — R112 Nausea with vomiting, unspecified: Secondary | ICD-10-CM | POA: Diagnosis present

## 2017-07-29 DIAGNOSIS — E785 Hyperlipidemia, unspecified: Secondary | ICD-10-CM | POA: Diagnosis present

## 2017-07-29 DIAGNOSIS — E119 Type 2 diabetes mellitus without complications: Secondary | ICD-10-CM | POA: Diagnosis present

## 2017-07-29 DIAGNOSIS — Z79899 Other long term (current) drug therapy: Secondary | ICD-10-CM

## 2017-07-29 DIAGNOSIS — Z8673 Personal history of transient ischemic attack (TIA), and cerebral infarction without residual deficits: Secondary | ICD-10-CM

## 2017-07-29 DIAGNOSIS — E1140 Type 2 diabetes mellitus with diabetic neuropathy, unspecified: Secondary | ICD-10-CM | POA: Diagnosis present

## 2017-07-29 DIAGNOSIS — Z6841 Body Mass Index (BMI) 40.0 and over, adult: Secondary | ICD-10-CM

## 2017-07-29 DIAGNOSIS — Z794 Long term (current) use of insulin: Secondary | ICD-10-CM

## 2017-07-29 DIAGNOSIS — D649 Anemia, unspecified: Secondary | ICD-10-CM | POA: Diagnosis present

## 2017-07-29 DIAGNOSIS — K5900 Constipation, unspecified: Secondary | ICD-10-CM | POA: Diagnosis present

## 2017-07-29 DIAGNOSIS — Z880 Allergy status to penicillin: Secondary | ICD-10-CM

## 2017-07-29 LAB — I-STAT BETA HCG BLOOD, ED (MC, WL, AP ONLY): I-stat hCG, quantitative: <5 m[IU]/mL (ref <5)

## 2017-07-29 LAB — GLUCOSE, CAPILLARY: Glucose-Capillary: 334 mg/dL — ABNORMAL HIGH (ref 65–99)

## 2017-07-29 LAB — CBC
HCT: 34.5 % — ABNORMAL LOW (ref 36.0–46.0)
Hemoglobin: 11 g/dL — ABNORMAL LOW (ref 12.0–15.0)
MCH: 27.6 pg (ref 26.0–34.0)
MCHC: 31.9 g/dL (ref 30.0–36.0)
MCV: 86.7 fL (ref 78.0–100.0)
Platelets: 466 10*3/uL — ABNORMAL HIGH (ref 150–400)
RBC: 3.98 MIL/uL (ref 3.87–5.11)
RDW: 14.5 % (ref 11.5–15.5)
WBC: 11 10*3/uL — ABNORMAL HIGH (ref 4.0–10.5)

## 2017-07-29 LAB — COMPREHENSIVE METABOLIC PANEL
ALT: 13 U/L — ABNORMAL LOW (ref 14–54)
AST: 16 U/L (ref 15–41)
Albumin: 3.5 g/dL (ref 3.5–5.0)
Alkaline Phosphatase: 98 U/L (ref 38–126)
Anion gap: 12 (ref 5–15)
BUN: 14 mg/dL (ref 6–20)
CHLORIDE: 103 mmol/L (ref 101–111)
CO2: 24 mmol/L (ref 22–32)
Calcium: 9.4 mg/dL (ref 8.9–10.3)
Creatinine, Ser: 1.17 mg/dL — ABNORMAL HIGH (ref 0.44–1.00)
GFR, EST NON AFRICAN AMERICAN: 52 mL/min — AB (ref >60)
Glucose, Bld: 202 mg/dL — ABNORMAL HIGH (ref 65–99)
POTASSIUM: 3 mmol/L — AB (ref 3.5–5.1)
Sodium: 139 mmol/L (ref 135–145)
Total Bilirubin: 1 mg/dL (ref 0.3–1.2)
Total Protein: 7.8 g/dL (ref 6.5–8.1)

## 2017-07-29 LAB — I-STAT TROPONIN, ED: Troponin i, poc: 0.01 ng/mL (ref 0.00–0.08)

## 2017-07-29 LAB — D-DIMER, QUANTITATIVE: D-Dimer, Quant: 0.66 ug/mL-FEU — ABNORMAL HIGH (ref 0.00–0.50)

## 2017-07-29 LAB — LIPASE, BLOOD: Lipase: 26 U/L (ref 11–51)

## 2017-07-29 MED ORDER — DULOXETINE HCL 30 MG PO CPEP
30.0000 mg | ORAL_CAPSULE | Freq: Two times a day (BID) | ORAL | Status: DC
  Administered 2017-07-29 – 2017-08-02 (×8): 30 mg via ORAL
  Filled 2017-07-29 (×8): qty 1

## 2017-07-29 MED ORDER — LORAZEPAM 2 MG/ML IJ SOLN
1.0000 mg | Freq: Once | INTRAMUSCULAR | Status: AC
  Administered 2017-07-29: 1 mg via INTRAVENOUS
  Filled 2017-07-29: qty 1

## 2017-07-29 MED ORDER — ONDANSETRON HCL 4 MG/2ML IJ SOLN
4.0000 mg | Freq: Three times a day (TID) | INTRAMUSCULAR | Status: DC
  Administered 2017-07-29 – 2017-07-31 (×5): 4 mg via INTRAVENOUS
  Filled 2017-07-29 (×5): qty 2

## 2017-07-29 MED ORDER — SODIUM CHLORIDE 0.9 % IV BOLUS
1000.0000 mL | Freq: Once | INTRAVENOUS | Status: AC
  Administered 2017-07-29: 1000 mL via INTRAVENOUS

## 2017-07-29 MED ORDER — INSULIN GLARGINE 100 UNIT/ML ~~LOC~~ SOLN
20.0000 [IU] | Freq: Every day | SUBCUTANEOUS | Status: DC
  Administered 2017-07-29 – 2017-07-30 (×2): 20 [IU] via SUBCUTANEOUS
  Filled 2017-07-29 (×2): qty 0.2

## 2017-07-29 MED ORDER — SODIUM CHLORIDE 0.9 % IV SOLN
INTRAVENOUS | Status: DC
  Administered 2017-07-31 (×2): via INTRAVENOUS

## 2017-07-29 MED ORDER — INSULIN ASPART 100 UNIT/ML ~~LOC~~ SOLN
0.0000 [IU] | Freq: Three times a day (TID) | SUBCUTANEOUS | Status: DC
  Administered 2017-07-30: 9 [IU] via SUBCUTANEOUS

## 2017-07-29 MED ORDER — METOCLOPRAMIDE HCL 5 MG/ML IJ SOLN
10.0000 mg | Freq: Four times a day (QID) | INTRAMUSCULAR | Status: DC
  Administered 2017-07-30 – 2017-07-31 (×5): 10 mg via INTRAVENOUS
  Filled 2017-07-29 (×7): qty 2

## 2017-07-29 MED ORDER — POTASSIUM CHLORIDE 10 MEQ/100ML IV SOLN
10.0000 meq | Freq: Once | INTRAVENOUS | Status: AC
  Administered 2017-07-29: 10 meq via INTRAVENOUS
  Filled 2017-07-29: qty 100

## 2017-07-29 MED ORDER — BISACODYL 10 MG RE SUPP
10.0000 mg | Freq: Every day | RECTAL | Status: DC | PRN
  Administered 2017-07-29: 10 mg via RECTAL
  Filled 2017-07-29 (×2): qty 1

## 2017-07-29 MED ORDER — ALLOPURINOL 100 MG PO TABS
100.0000 mg | ORAL_TABLET | Freq: Every day | ORAL | Status: DC
  Administered 2017-07-29 – 2017-08-02 (×5): 100 mg via ORAL
  Filled 2017-07-29 (×5): qty 1

## 2017-07-29 MED ORDER — FAMOTIDINE IN NACL 20-0.9 MG/50ML-% IV SOLN
20.0000 mg | Freq: Once | INTRAVENOUS | Status: AC
  Administered 2017-07-29: 20 mg via INTRAVENOUS
  Filled 2017-07-29: qty 50

## 2017-07-29 MED ORDER — HYOSCYAMINE SULFATE 0.125 MG/ML PO SOLN
0.2500 mg | Freq: Four times a day (QID) | ORAL | Status: DC | PRN
  Filled 2017-07-29 (×2): qty 2

## 2017-07-29 MED ORDER — ONDANSETRON HCL 4 MG/2ML IJ SOLN
4.0000 mg | Freq: Once | INTRAMUSCULAR | Status: AC
  Administered 2017-07-29: 4 mg via INTRAVENOUS
  Filled 2017-07-29: qty 2

## 2017-07-29 MED ORDER — SENNOSIDES-DOCUSATE SODIUM 8.6-50 MG PO TABS: 1.0000 | ORAL_TABLET | Freq: Every evening | ORAL | Status: DC | PRN

## 2017-07-29 MED ORDER — ONDANSETRON HCL 4 MG PO TABS: 4.0000 mg | ORAL_TABLET | Freq: Four times a day (QID) | ORAL | Status: DC | PRN

## 2017-07-29 MED ORDER — HEPARIN SODIUM (PORCINE) 5000 UNIT/ML IJ SOLN
5000.0000 [IU] | Freq: Three times a day (TID) | INTRAMUSCULAR | Status: DC
  Administered 2017-07-29 – 2017-08-02 (×10): 5000 [IU] via SUBCUTANEOUS
  Filled 2017-07-29 (×10): qty 1

## 2017-07-29 MED ORDER — IOPAMIDOL (ISOVUE-370) INJECTION 76%
INTRAVENOUS | Status: AC
  Administered 2017-07-29: 100 mL
  Filled 2017-07-29: qty 100

## 2017-07-29 MED ORDER — SUCRALFATE 1 G PO TABS
1.0000 g | ORAL_TABLET | Freq: Three times a day (TID) | ORAL | Status: DC
  Administered 2017-07-29 – 2017-08-02 (×15): 1 g via ORAL
  Filled 2017-07-29 (×15): qty 1

## 2017-07-29 MED ORDER — HYOSCYAMINE SULFATE 0.125 MG SL SUBL
0.2500 mg | SUBLINGUAL_TABLET | Freq: Four times a day (QID) | SUBLINGUAL | Status: DC | PRN
  Administered 2017-07-29 – 2017-07-30 (×2): 0.25 mg via SUBLINGUAL
  Filled 2017-07-29 (×4): qty 2

## 2017-07-29 MED ORDER — HALOPERIDOL LACTATE 5 MG/ML IJ SOLN: 5.0000 mg | Freq: Once | INTRAMUSCULAR | Status: DC

## 2017-07-29 MED ORDER — METOCLOPRAMIDE HCL 5 MG/ML IJ SOLN
10.0000 mg | Freq: Once | INTRAMUSCULAR | Status: AC
  Administered 2017-07-29: 10 mg via INTRAVENOUS
  Filled 2017-07-29: qty 2

## 2017-07-29 MED ORDER — METOCLOPRAMIDE HCL 5 MG/ML IJ SOLN
10.0000 mg | Freq: Once | INTRAMUSCULAR | Status: AC
  Administered 2017-07-29: 10 mg via INTRAVENOUS
  Filled 2017-07-29 (×2): qty 2

## 2017-07-29 MED ORDER — ONDANSETRON HCL 4 MG/2ML IJ SOLN: 4.0000 mg | Freq: Four times a day (QID) | INTRAMUSCULAR | Status: DC | PRN

## 2017-07-29 MED ORDER — ONDANSETRON HCL 4 MG/2ML IJ SOLN
4.0000 mg | Freq: Once | INTRAMUSCULAR | Status: DC
  Filled 2017-07-29: qty 2

## 2017-07-29 MED ORDER — HALOPERIDOL LACTATE 5 MG/ML IJ SOLN
4.0000 mg | Freq: Once | INTRAMUSCULAR | Status: AC
  Administered 2017-07-29: 4 mg via INTRAVENOUS
  Filled 2017-07-29: qty 1

## 2017-07-29 NOTE — H&P (Signed)
History and Physical    Deborah [name] XFG:[id number] DOB: [birth year] DOA: 07/29/2017   PCP: Patient, No Pcp Per   Patient coming from:  Home    Chief Complaint: Abdominal pain, intractable nausea and vomiting   HPI: Deborah [name] is a 53 y.o. female with medical history significant for diabetes, history of diabetes gastroparesis, status post cholecystectomy, gout, fibromyalgia, morbid obesity, GERD, hypertension, hyperlipidemia, history of stroke in 2012 without residual, depression presenting with diffuse acute on chronic abdominal pain, for the last 2 days, described as gastroparesis flareup.  She also has intractable nausea and known bile those vomiting, which have not been controlled at home with antiemetics or Reglan.  The patient also reported mild shortness of breath, without productive cough.  Apparently she has some chest wall pain, she denies any cardiac complaints or radiation of the pain to the jaw or to the arm.  She denies any fever or chills.  She denies any worsening lower extremity swelling.  She denies any dysuria or gross hematuria.  She denies any worsening diarrhea.  She has an intermittent diarrhea and constipation depending on the amount of laxative she takes.  ED Course:  BP 113/81   Pulse (!) 102   Temp (!) 97.4 F (36.3 C) (Axillary)   Resp (!) 38   LMP 10/10/2012   SpO2 100%   At the ER, the patient received IV Reglan, IV Zofran as needed, as well as well liter of IV fluids, with some improvement of her symptoms, but not resolved.   Potassium was 3.0, bicarb normal at 24, BUN 14, creatinine 1.17, lipase 26  White count 11, hemoglobin 11, platelets 466, troponin 0 0.01 D-dimer 0.66, with negative CT Angio of the chest for PE EKG sinus tachycardia without acute changes  review of Systems:  As per HPI otherwise all other systems reviewed and are negative  Past Medical History:  Diagnosis Date  . Chest pain 12/2015  . Diabetes mellitus   .  Fibromyalgia   . Gastric ulcer   . Gastroparesis   . Gout   . Hyperlipidemia   . Hypertension   . Obesity   . Pneumonia   . Stroke [facility]) 02/2011    Past Surgical History:  Procedure Laterality Date  . CATARACT EXTRACTION  01/2014  . CHOLECYSTECTOMY      Social History Social History   Socioeconomic History  . Marital status: Single    Spouse name: Not on file  . Number of children: Not on file  . Years of education: Not on file  . Highest education level: Not on file  Occupational History  . Not on file  Social Needs  . Financial resource strain: Not on file  . Food insecurity:    Worry: Not on file    Inability: Not on file  . Transportation needs:    Medical: Not on file    Non-medical: Not on file  Tobacco Use  . Smoking status: Never Smoker  . Smokeless tobacco: Never Used  Substance and Sexual Activity  . Alcohol use: No  . Drug use: No  . Sexual activity: Not Currently    Birth control/protection: None  Lifestyle  . Physical activity:    Days per week: Not on file    Minutes per session: Not on file  . Stress: Not on file  Relationships  . Social connections:    Talks on phone: Not on file    Gets together: Not on file  Attends religious service: Not on file    Active member of club or organization: Not on file    Attends meetings of clubs or organizations: Not on file    Relationship status: Not on file  . Intimate partner violence:    Fear of current or ex partner: Not on file    Emotionally abused: Not on file    Physically abused: Not on file    Forced sexual activity: Not on file  Other Topics Concern  . Not on file  Social History Narrative   ** Merged History Encounter **         Allergies  Allergen Reactions  . Diazepam [name]  . Lisinopril Anaphylaxis    Tongue and mouth swelling  . Penicillins Palpitations    Has patient had a PCN reaction causing immediate rash, facial/tongue/throat swelling, SOB or  lightheadedness with hypotension: Yes, heart races Has patient had a PCN reaction causing severe rash involving mucus membranes or skin necrosis: No Has patient had a PCN reaction that required hospitalization: Yes  Has patient had a PCN reaction occurring within the last 10 years: No   . Acetaminophen Nausea Only and Other (See Comments)    Irritates stomach ulcer  Abdominal pain  . Tolmetin Nausea Only    Other reaction(s): Other (See Comments) ULCER  . Aspirin Other (See Comments)    Irritates stomach ulcer   . Food Hives and Swelling    Carrots, ketchup   . Nsaids Other (See Comments)    ULCER  . Tramadol Nausea And Vomiting    Family History  Problem Relation Age of Onset  . Diabetes Mother   . Diabetes Father   . Heart disease Father   . Diabetes Sister   . Congestive Heart Failure Sister 74  . Diabetes Brother       Prior to Admission medications   Medication Sig Start Date End Date Taking? Authorizing Provider  allopurinol (ZYLOPRIM) 100 MG tablet Take 1 tablet (100 mg total) by mouth daily. 09/23/15  Yes Debbe Odea, MD  amLODipine (NORVASC) 10 MG tablet Take 1 tablet (10 mg total) by mouth daily. 06/08/16  Yes Nita Sells, MD  DULoxetine (CYMBALTA) 30 MG capsule Take 1 capsule (30 mg total) by mouth 2 (two) times daily. 09/23/15  Yes Debbe Odea, MD  insulin glargine (LANTUS) 100 UNIT/ML injection Inject 40 Units into the skin at bedtime. 04/01/17  Yes [provider]  insulin lispro (HUMALOG) 100 UNIT/ML injection Inject 10 Units into the skin 3 (three) times daily. 04/01/17  Yes [provider]  metoCLOPramide (REGLAN) 10 MG tablet Take 1 tablet (10 mg total) by mouth 3 (three) times daily before meals. 08/23/16  Yes Florencia Reasons, MD  metoprolol succinate (TOPROL-XL) 25 MG 24 hr tablet Take 1 tablet (25 mg total) by mouth daily. 06/08/16  Yes Nita Sells, MD  pantoprazole (PROTONIX) 40 MG tablet Take 1 tablet (40 mg total) by mouth 2  (two) times daily. 06/08/16  Yes Nita Sells, MD  sucralfate (CARAFATE) 1 g tablet Take 1 tablet (1 g total) by mouth 4 (four) times daily -  with meals and at bedtime. 08/23/16  Yes Florencia Reasons, MD  Magnesium Oxide 400 (240 Mg) MG TABS Take 1 tablet (400 mg total) by mouth daily at 2 PM. Patient not taking: Reported on 07/29/2017 08/23/16   Florencia Reasons, MD  NOVOLOG MIX 70/30 (70-30) 100 UNIT/ML injection 75 units in am, 60 units  lunch, and 50  units pm Patient not taking: Reported on 07/29/2017 06/08/16   Nita Sells, MD    Physical Exam:  Vitals:   07/29/17 1315 07/29/17 1419 07/29/17 1530 07/29/17 1618  BP: (!) 155/106 (!) 151/72 (!) 105/44 113/81  Pulse: 100 (!) 102    Resp: 17 15 (!) 38   Temp:      TempSrc:      SpO2: 100% 100%     Constitutional: NAD, calm, appears comfortable at the time of my evaluation Eyes: PERRL, lids and conjunctivae normal ENMT: Mucous membranes are moist, without exudate or lesions  Neck: normal, supple, no masses, no thyromegaly Respiratory: Essentially clear to auscultation bilaterally, no wheezing, no crackles. Normal respiratory effort there is sounds may be asked due to her body habitus Cardiovascular: Regular rate and rhythm,  murmur, rubs or gallops. No extremity edema. 2+ pedal pulses. No carotid bruits.  Abdomen: Soft, diffusely tender to light palpation morbidly obese no hepatosplenomegaly. Bowel sounds positive.  Musculoskeletal: no clubbing / cyanosis. Moves all extremities, mild chest wall pain Skin: no jaundice, No lesions.  Neurologic: Sensation intact  Strength equal in all extremities Psychiatric:   Alert and oriented x 3.  Affect    Labs on Admission: I have personally reviewed following labs and imaging studies  CBC: Recent Labs  Lab 07/27/17 1331 07/29/17 1026  WBC 11.8* 11.0*  HGB 10.6* 11.0*  HCT 33.1* 34.5*  MCV 87.1 86.7  PLT 439* 466*    Basic Metabolic Panel: Recent Labs  Lab 07/27/17 1331 07/29/17 1026    NA 139 139  K 3.0* 3.0*  CL 104 103  CO2 22 24  GLUCOSE 196* 202*  BUN 21* 14  CREATININE 1.38* 1.17*  CALCIUM 9.1 9.4    GFR: CrCl cannot be calculated (Unknown ideal weight.).  Liver Function Tests: Recent Labs  Lab 07/27/17 1331 07/29/17 1026  AST 17 16  ALT 13* 13*  ALKPHOS 94 98  BILITOT 0.6 1.0  PROT 8.1 7.8  ALBUMIN 3.6 3.5   Recent Labs  Lab 07/27/17 1331 07/29/17 1026  LIPASE 28 26   No results for input(s): AMMONIA in the last 168 hours.  Coagulation Profile: No results for input(s): INR, PROTIME in the last 168 hours.  Cardiac Enzymes: No results for input(s): CKTOTAL, CKMB, CKMBINDEX, TROPONINI in the last 168 hours.  BNP (last 3 results) No results for input(s): PROBNP in the last 8760 hours.  HbA1C: No results for input(s): HGBA1C in the last 72 hours.  CBG: No results for input(s): GLUCAP in the last 168 hours.  Lipid Profile: No results for input(s): CHOL, HDL, LDLCALC, TRIG, CHOLHDL, LDLDIRECT in the last 72 hours.  Thyroid Function Tests: No results for input(s): TSH, T4TOTAL, FREET4, T3FREE, THYROIDAB in the last 72 hours.  Anemia Panel: No results for input(s): VITAMINB12, FOLATE, FERRITIN, TIBC, IRON, RETICCTPCT in the last 72 hours.  Urine analysis:    Component Value Date/Time   COLORURINE YELLOW 03/23/2017 [place] 03/23/2017 0948   LABSPEC 1.012 03/23/2017 0948   PHURINE 6.0 03/23/2017 0948   GLUCOSEU NEGATIVE 03/23/2017 0948   HGBUR SMALL (A) 03/23/2017 0948   BILIRUBINUR NEGATIVE 03/23/2017 0948   KETONESUR NEGATIVE 03/23/2017 0948   PROTEINUR 100 (A) 03/23/2017 0948   UROBILINOGEN 0.2 10/02/2013 2108   NITRITE NEGATIVE 03/23/2017 0948   LEUKOCYTESUR NEGATIVE 03/23/2017 0948    Sepsis Labs: @LABRCNTIP (procalcitonin:4,lacticidven:4) )No results found for this or any previous visit (from the past 240 hour(s)).   Radiological Exams  on Admission: Dg Chest 2 View  Result Date: 07/29/2017 CLINICAL  DATA:  Chest pain and shortness of breath for the past week. EXAM: CHEST - 2 VIEW COMPARISON:  07/04/2016; 05/25/2015; chest CT-05/26/2015 FINDINGS: Grossly unchanged cardiac silhouette and mediastinal contours. No focal parenchymal opacities. No pleural effusion or pneumothorax. No evidence of edema. No acute osseus abnormalities. Suspected degenerative change of the bilateral glenohumeral and acromioclavicular joints, incompletely evaluated. Post cholecystectomy. IMPRESSION: No acute cardiopulmonary disease. Electronically Signed   By: [name] M.D.   On: 07/29/2017 10:40   Ct Angio Chest Pe W/cm &/or Wo Cm  Result Date: 07/29/2017 CLINICAL DATA:  Chest pain and shortness of breath. Elevated D-dimer. EXAM: CT ANGIOGRAPHY CHEST WITH CONTRAST TECHNIQUE: Multidetector CT imaging of the chest was performed using the standard protocol during bolus administration of intravenous contrast. Multiplanar CT image reconstructions and MIPs were obtained to evaluate the vascular anatomy. CONTRAST:  100 mL ISOVUE-370 IOPAMIDOL (ISOVUE-370) INJECTION 76% COMPARISON:  Chest radiograph earlier today.  CT chest 05/26/2015. FINDINGS: Cardiovascular: Satisfactory opacification of the pulmonary arteries to the segmental level. No evidence of pulmonary embolism. Mild cardiac enlargement. No pericardial effusion. Mild calcification of the transverse arch, not involving great vessels. Mediastinum/Nodes: No enlarged mediastinal, hilar, or axillary lymph nodes. Thyroid gland, trachea, and esophagus demonstrate no significant findings. Lungs/Pleura: Lungs are clear. No pleural effusion or pneumothorax. Upper Abdomen: Fatty infiltration of the liver. Musculoskeletal: No worrisome osseous lesion.  Thoracic spondylosis. Review of the MIP images confirms the above findings. IMPRESSION: No evidence for pulmonary emboli. Mild cardiac enlargement. No visible lung opacity. Aortic Atherosclerosis (ICD10-I70.0). Electronically Signed   By: Staci Righter M.D.   On: 07/29/2017 15:35    EKG: Independently reviewed.   Assessment/Plan Principal Problem:   Intractable nausea and vomiting Active Problems:   Abdominal pain   Gastroparesis   Essential hypertension, benign   DKA (diabetic ketoacidoses) ([place])   Uncontrolled type 2 diabetes mellitus with diabetic neuropathy, with long-term current use of insulin ([place])   Dyslipidemia associated with type 2 diabetes mellitus ([place])   CKD (chronic kidney disease), stage II   Gout   Anemia   Stroke (cerebrum) ([facility])   GERD (gastroesophageal reflux disease)   Intractable nausea and vomiting with diffuse abdominal pain, unlikely infectious, likely due to to diabetic gastroparesis.  The patient is afebrile, nontoxic appearing.  Mild leukocytosis, likely reactive.  Patient received Reglan, antiemetics, some Ativan, with some relief of her symptoms. Admit to telemetry opts Bowel rest Scheduled Zofran every 8 hours, with breakthrough Reglan, Phenergan, Ativan as needed IV Pepcid  IV fluids Advance diet as tolerated  Chronic kidney disease stage 2 baseline creatinine is 1, currently at 1.17 Lab Results  Component Value Date   CREATININE 1.17 (H) 07/29/2017   CREATININE 1.38 (H) 07/27/2017   CREATININE 0.97 03/23/2017   IVF Repeat BMET in am  Hold NSAIDS  GERD, no acute symptoms Continue Carafate, Pepcid Consider GI cocktail    Hypokalemia   Repeat BMET in am  [place].    Type II Diabetes Current blood sugar level is  Lab Results  Component Value Date   HGBA1C 9.7 (H) 07/05/2016   Hgb A1C Hold home oral diabetic medications.  Lantus , SSI   Depression Continue home  Cymbalta   Gout, no acute flare Continue Allopurinol   DVT prophylaxis:   Heparin sq  Code Status:    Full code  Family Communication:  Discussed with patient Disposition Plan: Expect patient to be  discharged to her current SNF  home after condition improves Consults called:     None Admission status:  Tele Obs    Sharene Butters, PA-C [facility]   Amion text  [phone]   07/29/2017, 4:45 PM

## 2017-07-29 NOTE — ED Notes (Signed)
Attempted report 

## 2017-07-29 NOTE — Progress Notes (Signed)
Pt. assisted to [facility] having c/o abdominal discomfort requesting pain medication, MD notified.  Received order and awaiting pharmacy verification.  Pt. becoming impatient with staff wanting to know why medication isn't available.  Education provided to patient about the process it takes to get orders and pharmacy process of verification. Apologized for the wait and reassured patient that as soon as possible medication will be given.  Offered warm compress was refused at this time.

## 2017-07-29 NOTE — ED Triage Notes (Signed)
Pt has multiple symptoms she reports chest pain and shortness of breath. She also states severe abdominal pain with n/v. Pt appears to be short of breath in triage. Seen recently for similar symptoms.

## 2017-07-29 NOTE — Progress Notes (Signed)
Patient given medication for discomfort as prescribed by doctor.  At this time patient requested enema for constipation was given dulcolax suppository at this time will await results.

## 2017-07-29 NOTE — ED Provider Notes (Signed)
[facility] EMERGENCY DEPARTMENT Provider Note   [name]: [id number] Arrival date & time: 07/29/17  4742     History   Chief Complaint Chief Complaint  Patient presents with  . Chest Pain    HPI Deborah [name] is a 52 y.o. female who presents with chest pain, SOB, abdominal pain. PMH significant for DM, gastroparesis, HLD, HTN, hx of CVA, hx of recurrent chest pain. She states that she's been at a rehab facility for her legs for the past 4 months in [place]. She got home last week and over the past week she has had gradually worsening diffuse chest pain, diffuse abdominal pain, shortness of breath and recurrent nausea and vomiting.  She reports the pain feeling like she is being stabbed and punched.  She denies fever, cough, wheezing, leg swelling. She is also been having diarrhea.  She denies urinary symptoms.  On review of EMR she had multiple presentations for similar complaints however is been approximately 1 year.  She states she lives at home with her mother.  She went to [facility] 2 days ago for this problem however left without being seen due to wait time and went to behavioral health with complaints of depression and was received outpatient resources.  HPI  Past Medical History:  Diagnosis Date  . Chest pain 12/2015  . Diabetes mellitus   . Fibromyalgia   . Gastric ulcer   . Gastroparesis   . Gout   . Hyperlipidemia   . Hypertension   . Obesity   . Pneumonia   . Stroke [facility]) 02/2011    Patient Active Problem List   Diagnosis Date Noted  . Back pain 03/19/2017  . Stroke (cerebrum) ([place]) 03/19/2017  . GERD (gastroesophageal reflux disease) 03/19/2017  . Depression 03/19/2017  . Obesity   . Urinary tract infection 08/16/2016  . Anemia 08/16/2016  . Gastroparesis 08/16/2016  . Intractable nausea and vomiting 06/17/2016  . Diabetic gastroparesis ([place]) 06/05/2016  . Gout 06/05/2016  . AKI (acute kidney injury) ([place]) 12/06/2015  . Hypokalemia  09/26/2015  . Hypomagnesemia 09/26/2015  . Nausea and vomiting 08/20/2015  . Gout flare 05/27/2015  . Abdominal pain 05/26/2015  . DKA (diabetic ketoacidoses) ([place]) 05/25/2015  . Uncontrolled type 2 diabetes mellitus with diabetic neuropathy, with long-term current use of insulin ([place]) 05/25/2015  . Dyslipidemia associated with type 2 diabetes mellitus ([place]) 05/25/2015  . CKD (chronic kidney disease), stage II 05/25/2015  . Essential hypertension, benign 09/28/2013    Past Surgical History:  Procedure Laterality Date  . CATARACT EXTRACTION  01/2014  . CHOLECYSTECTOMY       OB History   None      Home Medications    Prior to Admission medications   Medication Sig Start Date End Date Taking? Authorizing Provider  allopurinol (ZYLOPRIM) 100 MG tablet Take 1 tablet (100 mg total) by mouth daily. 09/23/15   Debbe Odea, MD  amLODipine (NORVASC) 10 MG tablet Take 1 tablet (10 mg total) by mouth daily. 06/08/16   Nita Sells, MD  atorvastatin (LIPITOR) 20 MG tablet Take 20 mg by mouth at bedtime.    [provider]  DULoxetine (CYMBALTA) 30 MG capsule Take 1 capsule (30 mg total) by mouth 2 (two) times daily. 09/23/15   Debbe Odea, MD  Magnesium Oxide 400 (240 Mg) MG TABS Take 1 tablet (400 mg total) by mouth daily at 2 PM. 08/23/16   Florencia Reasons, MD  metoCLOPramide (REGLAN) 10 MG tablet Take 1  tablet (10 mg total) by mouth 3 (three) times daily before meals. 08/23/16   Florencia Reasons, MD  metoprolol succinate (TOPROL-XL) 25 MG 24 hr tablet Take 1 tablet (25 mg total) by mouth daily. 06/08/16   Nita Sells, MD  NOVOLOG MIX 70/30 (70-30) 100 UNIT/ML injection 75 units in am, 60 units  lunch, and 50 units pm 06/08/16   Nita Sells, MD  pantoprazole (PROTONIX) 40 MG tablet Take 1 tablet (40 mg total) by mouth 2 (two) times daily. 06/08/16   Nita Sells, MD  sucralfate (CARAFATE) 1 g tablet Take 1 tablet (1 g total) by mouth 4 (four) times daily -  with meals  and at bedtime. 08/23/16   Florencia Reasons, MD    Family History Family History  Problem Relation Age of Onset  . Diabetes Mother   . Diabetes Father   . Heart disease Father   . Diabetes Sister   . Congestive Heart Failure Sister 39  . Diabetes Brother     Social History Social History   Tobacco Use  . Smoking status: Never Smoker  . Smokeless tobacco: Never Used  Substance Use Topics  . Alcohol use: No  . Drug use: No     Allergies   Diazepam; Lisinopril; Penicillins; Acetaminophen; Tolmetin; Aspirin; Food; Nsaids; and Tramadol   Review of Systems Review of Systems  Constitutional: Negative for fever.  Respiratory: Positive for shortness of breath. Negative for cough and wheezing.   Cardiovascular: Positive for chest pain.  Gastrointestinal: Positive for abdominal pain, diarrhea, nausea and vomiting.  Genitourinary: Negative for dysuria and frequency.  All other systems reviewed and are negative.    Physical Exam Updated Vital Signs BP 119/90   Pulse (!) 115   Temp (!) 97.4 F (36.3 C) (Axillary)   Resp (!) 22   LMP 10/10/2012   SpO2 100%   Physical Exam  Constitutional: She is oriented to person, place, and time. She appears well-developed and well-nourished. She appears distressed.  Obese, distressed, lying over the bedrail with yellow-tinged liquid vomit on the floor  HENT:  Head: Normocephalic and atraumatic.  Eyes: Pupils are equal, round, and reactive to light. Conjunctivae are normal. Right eye exhibits no discharge. Left eye exhibits no discharge. No scleral icterus.  Neck: Normal range of motion.  Cardiovascular: Tachycardia present.  Pulmonary/Chest: Effort normal and breath sounds normal. Tachypnea noted. No respiratory distress.  Abdominal: Soft. Bowel sounds are normal. She exhibits no distension. There is tenderness (diffuse).  Neurological: She is alert and oriented to person, place, and time.  Skin: Skin is warm and dry.  Psychiatric: She has a  normal mood and affect. Her behavior is normal.  Nursing note and vitals reviewed.    ED Treatments / Results  Labs (all labs ordered are listed, but only abnormal results are displayed) Labs Reviewed  CBC - Abnormal; Notable for the following components:      Result Value   WBC 11.0 (*)    Hemoglobin 11.0 (*)    HCT 34.5 (*)    Platelets 466 (*)    All other components within normal limits  COMPREHENSIVE METABOLIC PANEL - Abnormal; Notable for the following components:   Potassium 3.0 (*)    Glucose, Bld 202 (*)    Creatinine, Ser 1.17 (*)    ALT 13 (*)    GFR calc non Af Amer 52 (*)    All other components within normal limits  D-DIMER, QUANTITATIVE (NOT AT [facility]) - Abnormal; Notable for the  following components:   D-Dimer, Quant 0.66 (*)    All other components within normal limits  LIPASE, BLOOD  URINALYSIS, ROUTINE W REFLEX MICROSCOPIC  I-STAT TROPONIN, ED  I-STAT BETA HCG BLOOD, ED ([facility], [facility], [facility] ONLY)    EKG EKG Interpretation  Date/Time:  Friday Jul 29 2017 10:04:04 EDT Ventricular Rate:  117 PR Interval:  160 QRS Duration: 76 QT Interval:  342 QTC Calculation: 477 R Axis:   -48 Text Interpretation:  Sinus tachycardia Left anterior fascicular block Nonspecific ST abnormality Abnormal ECG no significant change since 2 days ago Confirmed by Sherwood Gambler [phone]) on 07/29/2017 11:50:01 AM   Radiology Dg Chest 2 View  Result Date: 07/29/2017 CLINICAL DATA:  Chest pain and shortness of breath for the past week. EXAM: CHEST - 2 VIEW COMPARISON:  07/04/2016; 05/25/2015; chest CT-05/26/2015 FINDINGS: Grossly unchanged cardiac silhouette and mediastinal contours. No focal parenchymal opacities. No pleural effusion or pneumothorax. No evidence of edema. No acute osseus abnormalities. Suspected degenerative change of the bilateral glenohumeral and acromioclavicular joints, incompletely evaluated. Post cholecystectomy. IMPRESSION: No acute cardiopulmonary disease.  Electronically Signed   By: [name] M.D.   On: 07/29/2017 10:40   Ct Angio Chest Pe W/cm &/or Wo Cm  Result Date: 07/29/2017 CLINICAL DATA:  Chest pain and shortness of breath. Elevated D-dimer. EXAM: CT ANGIOGRAPHY CHEST WITH CONTRAST TECHNIQUE: Multidetector CT imaging of the chest was performed using the standard protocol during bolus administration of intravenous contrast. Multiplanar CT image reconstructions and MIPs were obtained to evaluate the vascular anatomy. CONTRAST:  100 mL ISOVUE-370 IOPAMIDOL (ISOVUE-370) INJECTION 76% COMPARISON:  Chest radiograph earlier today.  CT chest 05/26/2015. FINDINGS: Cardiovascular: Satisfactory opacification of the pulmonary arteries to the segmental level. No evidence of pulmonary embolism. Mild cardiac enlargement. No pericardial effusion. Mild calcification of the transverse arch, not involving great vessels. Mediastinum/Nodes: No enlarged mediastinal, hilar, or axillary lymph nodes. Thyroid gland, trachea, and esophagus demonstrate no significant findings. Lungs/Pleura: Lungs are clear. No pleural effusion or pneumothorax. Upper Abdomen: Fatty infiltration of the liver. Musculoskeletal: No worrisome osseous lesion.  Thoracic spondylosis. Review of the MIP images confirms the above findings. IMPRESSION: No evidence for pulmonary emboli. Mild cardiac enlargement. No visible lung opacity. Aortic Atherosclerosis (ICD10-I70.0). Electronically Signed   By: Staci Righter M.D.   On: 07/29/2017 15:35    Procedures Procedures (including critical care time)  Medications Ordered in ED Medications  famotidine (PEPCID) IVPB 20 mg premix (has no administration in time range)  potassium chloride 10 mEq in 100 mL IVPB (has no administration in time range)  LORazepam (ATIVAN) injection 1 mg (1 mg Intravenous Given 07/29/17 1214)  metoCLOPramide (REGLAN) injection 10 mg (10 mg Intravenous Given 07/29/17 1216)  sodium chloride 0.9 % bolus 1,000 mL (1,000 mLs Intravenous  New Bag/Given 07/29/17 1218)  haloperidol lactate (HALDOL) injection 4 mg (4 mg Intravenous Given 07/29/17 1420)  ondansetron (ZOFRAN) injection 4 mg (4 mg Intravenous Given 07/29/17 1419)  iopamidol (ISOVUE-370) 76 % injection (100 mLs  Contrast Given 07/29/17 1454)     Initial Impression / Assessment and Plan / ED Course  I have reviewed the triage vital signs and the nursing notes.  Pertinent labs & imaging results that were available during my care of the patient were reviewed by me and considered in my medical decision making (see chart for details).  52 year old female presents with chest pain, SOB, abdominal pain. She reports this is consistent with her "gastroparesis" flares. She is tachycardic which appears  to be chronic. She is also hypertensive and tachypneic. Exam is difficult due to the patient's distress. She is unable to lie on her back for an abdominal exam. Will give Reglan, Ativan, fluids.  2:24 PM On recheck, the patient is still reporting significant pain. She is crying and distressed. I did exam her abdomen which is soft and she is generally tender. She is requesting Morphine stating "why can't you give me what they always give me". I explained to her that narcotics are not indicated for gastroparesis and would be a last resort. She was given Haldol, Zofran, Pepcid. Labs are overall reassuring. CBC is remarkable for mild leukocytosis of 11 and is mildly anemic. CMP is remarkable for hypokalemia (3), hyperglycemia (202), mildly elevated SCr (1.17). It appears very similar to labs done 2 days ago. Chest pain is likely not cardiac. Her EKG is sinus tachycardia, CXR is negative, troponin is normal.  3:29 PM Re-evaluated the patient. She is hypotensive with blood pressures ~51 systolic. Possibly due to Haldol administration. She is very sleepy but responds to questions. CTA is pending.   3:42 PM CT is negative for PE. BP is slightly better. She still reports persistent pain and nausea.  Shared visit with Dr. Regenia Skeeter. Will admit for further management. Spoke with Providence Crosby with [facility] who will admit.   Final Clinical Impressions(s) / ED Diagnoses   Final diagnoses:  Atypical chest pain  Generalized abdominal pain    ED Discharge Orders    None       Recardo Evangelist, PA-C 07/29/17 1616    Sherwood Gambler, MD 08/01/17 1311

## 2017-07-29 NOTE — Progress Notes (Signed)
Pt arrived to floor, pt settled, VSS at this time. Pt incontinent of bowel and bladder, requesting medications, awaiting new orders. Will continue to monitor.

## 2017-07-29 NOTE — Progress Notes (Addendum)
Patient observed resting without pain medication was effective.  Had requested that patient give medication a little more time to allow it to work, and offered warm compress again and patient refused.  Refused scheduled Reglan.  Assisted back to [facility] no results yet from the suppository given earlier just gas at this time back to bed.  Will continue to monitor.

## 2017-07-29 NOTE — ED Notes (Signed)
Patient transported to CT 

## 2017-07-30 ENCOUNTER — Observation Stay (HOSPITAL_COMMUNITY): Payer: Medicaid Other

## 2017-07-30 ENCOUNTER — Other Ambulatory Visit: Payer: Self-pay

## 2017-07-30 ENCOUNTER — Encounter (HOSPITAL_COMMUNITY): Payer: Self-pay | Admitting: Radiology

## 2017-07-30 DIAGNOSIS — E785 Hyperlipidemia, unspecified: Secondary | ICD-10-CM

## 2017-07-30 DIAGNOSIS — E1169 Type 2 diabetes mellitus with other specified complication: Secondary | ICD-10-CM

## 2017-07-30 DIAGNOSIS — R0789 Other chest pain: Secondary | ICD-10-CM

## 2017-07-30 LAB — URINALYSIS, ROUTINE W REFLEX MICROSCOPIC
Bacteria, UA: NONE SEEN
Bilirubin Urine: NEGATIVE
Ketones, ur: 20 mg/dL — AB
LEUKOCYTES UA: NEGATIVE
Nitrite: NEGATIVE
PH: 6 (ref 5.0–8.0)
PROTEIN: 100 mg/dL — AB
Specific Gravity, Urine: 1.021 (ref 1.005–1.030)

## 2017-07-30 LAB — BASIC METABOLIC PANEL
ANION GAP: 13 (ref 5–15)
BUN: 13 mg/dL (ref 6–20)
CHLORIDE: 103 mmol/L (ref 101–111)
CO2: 23 mmol/L (ref 22–32)
Calcium: 8.9 mg/dL (ref 8.9–10.3)
Creatinine, Ser: 1.24 mg/dL — ABNORMAL HIGH (ref 0.44–1.00)
GFR calc Af Amer: 56 mL/min — ABNORMAL LOW (ref >60)
GFR calc non Af Amer: 49 mL/min — ABNORMAL LOW (ref >60)
GLUCOSE: 377 mg/dL — AB (ref 65–99)
POTASSIUM: 3.5 mmol/L (ref 3.5–5.1)
SODIUM: 139 mmol/L (ref 135–145)

## 2017-07-30 LAB — CBC
HEMATOCRIT: 33.5 % — AB (ref 36.0–46.0)
HEMOGLOBIN: 10.5 g/dL — AB (ref 12.0–15.0)
MCH: 27.8 pg (ref 26.0–34.0)
MCHC: 31.3 g/dL (ref 30.0–36.0)
MCV: 88.6 fL (ref 78.0–100.0)
Platelets: 412 10*3/uL — ABNORMAL HIGH (ref 150–400)
RBC: 3.78 MIL/uL — ABNORMAL LOW (ref 3.87–5.11)
RDW: 14.6 % (ref 11.5–15.5)
WBC: 17.5 10*3/uL — AB (ref 4.0–10.5)

## 2017-07-30 LAB — GLUCOSE, CAPILLARY
GLUCOSE-CAPILLARY: 399 mg/dL — AB (ref 65–99)
Glucose-Capillary: 313 mg/dL — ABNORMAL HIGH (ref 65–99)
Glucose-Capillary: 304 mg/dL — ABNORMAL HIGH (ref 65–99)
Glucose-Capillary: 235 mg/dL — ABNORMAL HIGH (ref 65–99)

## 2017-07-30 LAB — PROCALCITONIN: Procalcitonin: 0.14 ng/mL

## 2017-07-30 LAB — HEMOGLOBIN A1C
HEMOGLOBIN A1C: 8.5 % — AB (ref 4.8–5.6)
Mean Plasma Glucose: 197.25 mg/dL

## 2017-07-30 MED ORDER — INSULIN ASPART 100 UNIT/ML ~~LOC~~ SOLN
0.0000 [IU] | SUBCUTANEOUS | Status: DC
  Administered 2017-07-30: 5 [IU] via SUBCUTANEOUS
  Administered 2017-07-30 (×2): 11 [IU] via SUBCUTANEOUS
  Administered 2017-07-31 (×2): 5 [IU] via SUBCUTANEOUS

## 2017-07-30 MED ORDER — GI COCKTAIL ~~LOC~~
30.0000 mL | Freq: Three times a day (TID) | ORAL | Status: DC | PRN
  Administered 2017-07-30: 30 mL via ORAL
  Filled 2017-07-30 (×2): qty 30

## 2017-07-30 MED ORDER — IOHEXOL 300 MG/ML  SOLN
100.0000 mL | Freq: Once | INTRAMUSCULAR | Status: AC | PRN
  Administered 2017-07-30: 100 mL via INTRAVENOUS

## 2017-07-30 MED ORDER — METOPROLOL SUCCINATE ER 25 MG PO TB24
25.0000 mg | ORAL_TABLET | Freq: Every day | ORAL | Status: DC
  Administered 2017-07-30 – 2017-08-02 (×4): 25 mg via ORAL
  Filled 2017-07-30 (×4): qty 1

## 2017-07-30 MED ORDER — MORPHINE SULFATE (PF) 2 MG/ML IV SOLN
1.0000 mg | INTRAVENOUS | Status: DC | PRN
  Administered 2017-07-30 – 2017-07-31 (×7): 1 mg via INTRAVENOUS
  Filled 2017-07-30 (×8): qty 1

## 2017-07-30 MED ORDER — FAMOTIDINE IN NACL 20-0.9 MG/50ML-% IV SOLN
20.0000 mg | Freq: Two times a day (BID) | INTRAVENOUS | Status: DC
  Administered 2017-07-30 – 2017-07-31 (×4): 20 mg via INTRAVENOUS
  Filled 2017-07-30 (×4): qty 50

## 2017-07-30 NOTE — Progress Notes (Signed)
Patient had no c/o at this time resting easy to awaken during rounds.

## 2017-07-30 NOTE — Progress Notes (Signed)
Patient requested a GI cocktail for heartburn discomfort.   Notified doctor received order and patient given medication.  Will monitor for effectiveness.

## 2017-07-30 NOTE — Progress Notes (Signed)
[facility]                                                                              Patient Demographics  Deborah [name], is a 52 y.o. female, DOB - April 12, 1965, KZL:[id number]  Admit date - 07/29/2017   Admitting Physician Roney Jaffe, MD  Outpatient Primary MD for the patient is Patient, No Pcp Per  Outpatient specialists:   LOS - 0  days   Medical records reviewed and are as summarized below:    Chief Complaint  Patient presents with  . Chest Pain       Brief summary   Patient is a 52 year old female with diabetes, history of gastroparesis, cholecystectomy, gout, fibromyalgia, morbid obesity, GERD, hypertension, hyperlipidemia, stroke in 2012 presented with acute on chronic abdominal pain for the last 2 weeks, worse in the last 2 days with intractable nausea and vomiting.  No hematemesis or hematochezia or melena.  No fevers or chills.  Patient also reports that she has been having diarrhea with multiple BMs.   Assessment & Plan    Principal Problem:   Intractable nausea and vomiting with diffuse abdominal pain, ?  Diarrhea -Has a history of diabetic gastroparesis, rule out any diverticulitis, colitis.  CT angiogram of the chest negative for PE.  LFTs normal, lipase 26 - obtain CT abdomen pelvis with contrast - NPO, IV fluid hydration, IV pain control, Zofran, Reglan -Placed on IV PPI -Leukocytosis but no fevers, obtain blood cultures, procalcitonin.  Hold off on antibiotics unless colitis.   Active Problems:   Essential hypertension, benign -BP slightly elevated, anxious, restart metoprolol    Uncontrolled type 2 diabetes mellitus with diabetic neuropathy, with long-term current use of insulin (HCC) -Obtain hemoglobin A1c, placed on sliding scale insulin q hours, continue Lantus  Acute on chronic CKD (chronic kidney disease), stage II -Baseline creatinine 0.9, admitted with creatinine of 1.3 likely due to dehydration, continue IV  fluids      Anemia, normocytic H&H currently at baseline, continue to follow     GERD (gastroesophageal reflux disease) Continue Pepcid 20 mg IV every 12 hours  Morbidly obese BMI 49, counseled on diet and weight control  Code Status: Full CODE STATUS  DVT Prophylaxis: Heparin subcu Family Communication: Discussed in detail with the patient, all imaging results, lab results explained to the patient   Disposition Plan: When medically ready  Time Spent in minutes  35 minutes  Procedures:  CT angiogram chest negative for PE  Consultants:   None  Antimicrobials:      Medications  Scheduled Meds: . allopurinol  100 mg Oral Daily  . DULoxetine  30 mg Oral BID  . heparin  5,000 Units Subcutaneous Q8H  . insulin aspart  0-9 Units Subcutaneous TID WC  . insulin glargine  20 Units Subcutaneous QHS  . metoCLOPramide (REGLAN) injection  10 mg Intravenous Q6H  . ondansetron (ZOFRAN) IV  4 mg Intravenous Q8H  . ondansetron (ZOFRAN) IV  4 mg Intravenous Once  . sucralfate  1 g Oral TID WC & HS   Continuous Infusions: . sodium chloride 100 mL/hr at 07/29/17 2001  . famotidine (  PEPCID) IV     PRN Meds:.bisacodyl, gi cocktail, hyoscyamine, morphine injection, senna-docusate   Antibiotics   Anti-infectives (From admission, onward)   None        Subjective:   [name] was seen and examined today.  Complaining of nausea, diffuse abdominal pain, demanding for pain medications.  No fevers or chills.  No vomiting or diarrhea this morning  Objective:   Vitals:   07/29/17 2346 07/30/17 0325 07/30/17 0500 07/30/17 0837  BP: (!) 152/72 (!) 149/68  (!) 145/86  Pulse: (!) 107 (!) 109  (!) 114  Resp: (!) 34 19  (!) 22  Temp: 97.9 F (36.6 C) 98.5 F (36.9 C)  98.5 F (36.9 C)  TempSrc: Oral Oral  Oral  SpO2: 99% 98%  96%  Weight:   (!) 137.8 kg (303 lb 12.7 oz)   Height:        Intake/Output Summary (Last 24 hours) at 07/30/2017 1018 Last data filed at  07/30/2017 0400 Gross per 24 hour  Intake 240 ml  Output 350 ml  Net -110 ml     Wt Readings from Last 3 Encounters:  07/30/17 (!) 137.8 kg (303 lb 12.7 oz)  03/19/17 (!) 140.6 kg (310 lb)  08/16/16 133.6 kg (294 lb 8.6 oz)     Exam  General: Alert and oriented x 3, NAD, very anxious  Eyes:   HEENT:   Cardiovascular: S1 S2 auscultated,. Regular rate and rhythm.  Respiratory: Clear to auscultation bilaterally, no wheezing, rales or rhonchi  Gastrointestinal: Obese, soft, diffusely tender to deep palpation no rebound or guarding, nondistended, + bowel sounds  Ext: no pedal edema bilaterally  Neuro: no new deficits  Musculoskeletal: No digital cyanosis, clubbing  Skin: No rashes  Psych: anxious, alert and oriented x3    Data Reviewed:  I have personally reviewed following labs and imaging studies  Micro Results No results found for this or any previous visit (from the past 240 hour(s)).  Radiology Reports Dg Chest 2 View  Result Date: 07/29/2017 CLINICAL DATA:  Chest pain and shortness of breath for the past week. EXAM: CHEST - 2 VIEW COMPARISON:  07/04/2016; 05/25/2015; chest CT-05/26/2015 FINDINGS: Grossly unchanged cardiac silhouette and mediastinal contours. No focal parenchymal opacities. No pleural effusion or pneumothorax. No evidence of edema. No acute osseus abnormalities. Suspected degenerative change of the bilateral glenohumeral and acromioclavicular joints, incompletely evaluated. Post cholecystectomy. IMPRESSION: No acute cardiopulmonary disease. Electronically Signed   By: [name] M.D.   On: 07/29/2017 10:40   Ct Angio Chest Pe W/cm &/or Wo Cm  Result Date: 07/29/2017 CLINICAL DATA:  Chest pain and shortness of breath. Elevated D-dimer. EXAM: CT ANGIOGRAPHY CHEST WITH CONTRAST TECHNIQUE: Multidetector CT imaging of the chest was performed using the standard protocol during bolus administration of intravenous contrast. Multiplanar CT image  reconstructions and MIPs were obtained to evaluate the vascular anatomy. CONTRAST:  100 mL ISOVUE-370 IOPAMIDOL (ISOVUE-370) INJECTION 76% COMPARISON:  Chest radiograph earlier today.  CT chest 05/26/2015. FINDINGS: Cardiovascular: Satisfactory opacification of the pulmonary arteries to the segmental level. No evidence of pulmonary embolism. Mild cardiac enlargement. No pericardial effusion. Mild calcification of the transverse arch, not involving great vessels. Mediastinum/Nodes: No enlarged mediastinal, hilar, or axillary lymph nodes. Thyroid gland, trachea, and esophagus demonstrate no significant findings. Lungs/Pleura: Lungs are clear. No pleural effusion or pneumothorax. Upper Abdomen: Fatty infiltration of the liver. Musculoskeletal: No worrisome osseous lesion.  Thoracic spondylosis. Review of the MIP images confirms the above findings. IMPRESSION: No  evidence for pulmonary emboli. Mild cardiac enlargement. No visible lung opacity. Aortic Atherosclerosis (ICD10-I70.0). Electronically Signed   By: Staci Righter M.D.   On: 07/29/2017 15:35    Lab Data:  CBC: Recent Labs  Lab 07/27/17 1331 07/29/17 1026 07/30/17 0515  WBC 11.8* 11.0* 17.5*  HGB 10.6* 11.0* 10.5*  HCT 33.1* 34.5* 33.5*  MCV 87.1 86.7 88.6  PLT 439* 466* 035*   Basic Metabolic Panel: Recent Labs  Lab 07/27/17 1331 07/29/17 1026 07/30/17 0515  NA 139 139 139  K 3.0* 3.0* 3.5  CL 104 103 103  CO2 22 24 23   GLUCOSE 196* 202* 377*  BUN 21* 14 13  CREATININE 1.38* 1.17* 1.24*  CALCIUM 9.1 9.4 8.9   GFR: Estimated Creatinine Clearance: 75.1 mL/min (A) (by C-G formula based on SCr of 1.24 mg/dL (H)). Liver Function Tests: Recent Labs  Lab 07/27/17 1331 07/29/17 1026  AST 17 16  ALT 13* 13*  ALKPHOS 94 98  BILITOT 0.6 1.0  PROT 8.1 7.8  ALBUMIN 3.6 3.5   Recent Labs  Lab 07/27/17 1331 07/29/17 1026  LIPASE 28 26   No results for input(s): AMMONIA in the last 168 hours. Coagulation Profile: No results  for input(s): INR, PROTIME in the last 168 hours. Cardiac Enzymes: No results for input(s): CKTOTAL, CKMB, CKMBINDEX, TROPONINI in the last 168 hours. BNP (last 3 results) No results for input(s): PROBNP in the last 8760 hours. HbA1C: No results for input(s): HGBA1C in the last 72 hours. CBG: Recent Labs  Lab 07/29/17 2128 07/30/17 0835  GLUCAP 334* 399*   Lipid Profile: No results for input(s): CHOL, HDL, LDLCALC, TRIG, CHOLHDL, LDLDIRECT in the last 72 hours. Thyroid Function Tests: No results for input(s): TSH, T4TOTAL, FREET4, T3FREE, THYROIDAB in the last 72 hours. Anemia Panel: No results for input(s): VITAMINB12, FOLATE, FERRITIN, TIBC, IRON, RETICCTPCT in the last 72 hours. Urine analysis:    Component Value Date/Time   COLORURINE YELLOW 03/23/2017 [place] 03/23/2017 0948   LABSPEC 1.012 03/23/2017 0948   PHURINE 6.0 03/23/2017 0948   GLUCOSEU NEGATIVE 03/23/2017 0948   HGBUR SMALL (A) 03/23/2017 0948   BILIRUBINUR NEGATIVE 03/23/2017 [place] 03/23/2017 0948   PROTEINUR 100 (A) 03/23/2017 0948   UROBILINOGEN 0.2 10/02/2013 2108   NITRITE NEGATIVE 03/23/2017 0948   LEUKOCYTESUR NEGATIVE 03/23/2017 0948     Breigh Annett M.D. [facility] 07/30/2017, 10:18 AM  Pager: [phone] Between 7am to 7pm - call Pager - [phone]  After 7pm go to [email or web address] - password [email or web address]  Call night coverage person covering after 7pm

## 2017-07-30 NOTE — Progress Notes (Signed)
Patient had labs done and called for tech for assistance was incontinent of urine.  Peri care provided and linens changed during this time patient requested pain medication.  Educated patient on the orders as it pertained to amount and time medication are given.  Inform patient that doctors will be making rounds and will address for pain for management.

## 2017-07-31 DIAGNOSIS — Z515 Encounter for palliative care: Secondary | ICD-10-CM

## 2017-07-31 DIAGNOSIS — Z79899 Other long term (current) drug therapy: Secondary | ICD-10-CM | POA: Diagnosis not present

## 2017-07-31 DIAGNOSIS — Z6841 Body Mass Index (BMI) 40.0 and over, adult: Secondary | ICD-10-CM | POA: Diagnosis not present

## 2017-07-31 DIAGNOSIS — E1143 Type 2 diabetes mellitus with diabetic autonomic (poly)neuropathy: Secondary | ICD-10-CM | POA: Diagnosis present

## 2017-07-31 DIAGNOSIS — K219 Gastro-esophageal reflux disease without esophagitis: Secondary | ICD-10-CM | POA: Diagnosis present

## 2017-07-31 DIAGNOSIS — E1122 Type 2 diabetes mellitus with diabetic chronic kidney disease: Secondary | ICD-10-CM | POA: Diagnosis present

## 2017-07-31 DIAGNOSIS — N179 Acute kidney failure, unspecified: Secondary | ICD-10-CM | POA: Diagnosis present

## 2017-07-31 DIAGNOSIS — I129 Hypertensive chronic kidney disease with stage 1 through stage 4 chronic kidney disease, or unspecified chronic kidney disease: Secondary | ICD-10-CM | POA: Diagnosis present

## 2017-07-31 DIAGNOSIS — G8929 Other chronic pain: Secondary | ICD-10-CM | POA: Diagnosis present

## 2017-07-31 DIAGNOSIS — R197 Diarrhea, unspecified: Secondary | ICD-10-CM | POA: Diagnosis present

## 2017-07-31 DIAGNOSIS — D649 Anemia, unspecified: Secondary | ICD-10-CM | POA: Diagnosis present

## 2017-07-31 DIAGNOSIS — F329 Major depressive disorder, single episode, unspecified: Secondary | ICD-10-CM | POA: Diagnosis present

## 2017-07-31 DIAGNOSIS — R1084 Generalized abdominal pain: Secondary | ICD-10-CM | POA: Diagnosis present

## 2017-07-31 DIAGNOSIS — M109 Gout, unspecified: Secondary | ICD-10-CM | POA: Diagnosis present

## 2017-07-31 DIAGNOSIS — N182 Chronic kidney disease, stage 2 (mild): Secondary | ICD-10-CM | POA: Diagnosis present

## 2017-07-31 DIAGNOSIS — E860 Dehydration: Secondary | ICD-10-CM | POA: Diagnosis present

## 2017-07-31 DIAGNOSIS — E876 Hypokalemia: Secondary | ICD-10-CM | POA: Diagnosis present

## 2017-07-31 DIAGNOSIS — K5900 Constipation, unspecified: Secondary | ICD-10-CM | POA: Diagnosis present

## 2017-07-31 DIAGNOSIS — I4581 Long QT syndrome: Secondary | ICD-10-CM | POA: Diagnosis present

## 2017-07-31 DIAGNOSIS — Z8673 Personal history of transient ischemic attack (TIA), and cerebral infarction without residual deficits: Secondary | ICD-10-CM | POA: Diagnosis not present

## 2017-07-31 DIAGNOSIS — E1140 Type 2 diabetes mellitus with diabetic neuropathy, unspecified: Secondary | ICD-10-CM | POA: Diagnosis present

## 2017-07-31 DIAGNOSIS — E785 Hyperlipidemia, unspecified: Secondary | ICD-10-CM | POA: Diagnosis present

## 2017-07-31 DIAGNOSIS — M797 Fibromyalgia: Secondary | ICD-10-CM | POA: Diagnosis present

## 2017-07-31 DIAGNOSIS — K3184 Gastroparesis: Secondary | ICD-10-CM | POA: Diagnosis present

## 2017-07-31 LAB — BASIC METABOLIC PANEL
ANION GAP: 9 (ref 5–15)
BUN: 12 mg/dL (ref 6–20)
CHLORIDE: 107 mmol/L (ref 101–111)
CO2: 25 mmol/L (ref 22–32)
Calcium: 8.8 mg/dL — ABNORMAL LOW (ref 8.9–10.3)
Creatinine, Ser: 1.2 mg/dL — ABNORMAL HIGH (ref 0.44–1.00)
GFR calc Af Amer: 59 mL/min — ABNORMAL LOW (ref >60)
GFR calc non Af Amer: 51 mL/min — ABNORMAL LOW (ref >60)
Glucose, Bld: 237 mg/dL — ABNORMAL HIGH (ref 65–99)
Potassium: 3.2 mmol/L — ABNORMAL LOW (ref 3.5–5.1)
Sodium: 141 mmol/L (ref 135–145)

## 2017-07-31 LAB — GLUCOSE, CAPILLARY
GLUCOSE-CAPILLARY: 222 mg/dL — AB (ref 65–99)
GLUCOSE-CAPILLARY: 219 mg/dL — AB (ref 65–99)
GLUCOSE-CAPILLARY: 176 mg/dL — AB (ref 65–99)
Glucose-Capillary: 235 mg/dL — ABNORMAL HIGH (ref 65–99)
Glucose-Capillary: 289 mg/dL — ABNORMAL HIGH (ref 65–99)
Glucose-Capillary: 273 mg/dL — ABNORMAL HIGH (ref 65–99)

## 2017-07-31 LAB — CBC
HCT: 32.7 % — ABNORMAL LOW (ref 36.0–46.0)
Hemoglobin: 10.2 g/dL — ABNORMAL LOW (ref 12.0–15.0)
MCH: 27.5 pg (ref 26.0–34.0)
MCHC: 31.2 g/dL (ref 30.0–36.0)
MCV: 88.1 fL (ref 78.0–100.0)
PLATELETS: 420 10*3/uL — AB (ref 150–400)
RBC: 3.71 MIL/uL — ABNORMAL LOW (ref 3.87–5.11)
RDW: 14.7 % (ref 11.5–15.5)
WBC: 16.7 10*3/uL — AB (ref 4.0–10.5)

## 2017-07-31 MED ORDER — POTASSIUM CHLORIDE CRYS ER 20 MEQ PO TBCR
40.0000 meq | EXTENDED_RELEASE_TABLET | Freq: Once | ORAL | Status: AC
  Administered 2017-07-31: 40 meq via ORAL
  Filled 2017-07-31: qty 2

## 2017-07-31 MED ORDER — INSULIN ASPART 100 UNIT/ML ~~LOC~~ SOLN
5.0000 [IU] | Freq: Three times a day (TID) | SUBCUTANEOUS | Status: DC
  Administered 2017-07-31 – 2017-08-01 (×3): 5 [IU] via SUBCUTANEOUS

## 2017-07-31 MED ORDER — METOCLOPRAMIDE HCL 10 MG PO TABS
10.0000 mg | ORAL_TABLET | Freq: Four times a day (QID) | ORAL | Status: DC
  Administered 2017-07-31 – 2017-08-01 (×3): 10 mg via ORAL
  Filled 2017-07-31 (×3): qty 1

## 2017-07-31 MED ORDER — INSULIN ASPART 100 UNIT/ML ~~LOC~~ SOLN: 5.0000 [IU] | Freq: Three times a day (TID) | SUBCUTANEOUS | Status: DC

## 2017-07-31 MED ORDER — MORPHINE SULFATE (PF) 2 MG/ML IV SOLN
2.0000 mg | INTRAVENOUS | Status: AC | PRN
  Administered 2017-07-31 – 2017-08-01 (×2): 2 mg via INTRAVENOUS
  Filled 2017-07-31 (×2): qty 1

## 2017-07-31 MED ORDER — INSULIN ASPART 100 UNIT/ML ~~LOC~~ SOLN: 0.0000 [IU] | Freq: Every day | SUBCUTANEOUS | Status: DC

## 2017-07-31 MED ORDER — INSULIN ASPART 100 UNIT/ML ~~LOC~~ SOLN: 0.0000 [IU] | Freq: Three times a day (TID) | SUBCUTANEOUS | Status: DC

## 2017-07-31 MED ORDER — OXYCODONE HCL 5 MG PO TABS
5.0000 mg | ORAL_TABLET | Freq: Four times a day (QID) | ORAL | Status: DC | PRN
  Administered 2017-07-31 – 2017-08-01 (×2): 5 mg via ORAL
  Filled 2017-07-31 (×2): qty 1

## 2017-07-31 MED ORDER — AMLODIPINE BESYLATE 10 MG PO TABS
10.0000 mg | ORAL_TABLET | Freq: Every day | ORAL | Status: DC
  Administered 2017-07-31 – 2017-08-02 (×3): 10 mg via ORAL
  Filled 2017-07-31 (×3): qty 1

## 2017-07-31 MED ORDER — INSULIN ASPART 100 UNIT/ML ~~LOC~~ SOLN
0.0000 [IU] | Freq: Three times a day (TID) | SUBCUTANEOUS | Status: DC
  Administered 2017-07-31: 8 [IU] via SUBCUTANEOUS
  Administered 2017-07-31: 5 [IU] via SUBCUTANEOUS
  Administered 2017-07-31: 8 [IU] via SUBCUTANEOUS
  Administered 2017-08-01: 11 [IU] via SUBCUTANEOUS
  Administered 2017-08-01: 5 [IU] via SUBCUTANEOUS
  Administered 2017-08-01: 8 [IU] via SUBCUTANEOUS
  Administered 2017-08-02: 2 [IU] via SUBCUTANEOUS
  Administered 2017-08-02: 3 [IU] via SUBCUTANEOUS
  Administered 2017-08-02: 5 [IU] via SUBCUTANEOUS

## 2017-07-31 MED ORDER — INSULIN GLARGINE 100 UNIT/ML ~~LOC~~ SOLN
24.0000 [IU] | Freq: Every day | SUBCUTANEOUS | Status: DC
  Administered 2017-07-31: 24 [IU] via SUBCUTANEOUS
  Filled 2017-07-31: qty 0.24

## 2017-07-31 MED ORDER — ONDANSETRON 4 MG PO TBDP
4.0000 mg | ORAL_TABLET | Freq: Three times a day (TID) | ORAL | Status: DC
  Administered 2017-07-31 – 2017-08-01 (×3): 4 mg via ORAL
  Filled 2017-07-31 (×4): qty 1

## 2017-07-31 MED ORDER — INSULIN ASPART 100 UNIT/ML ~~LOC~~ SOLN: 3.0000 [IU] | Freq: Three times a day (TID) | SUBCUTANEOUS | Status: DC

## 2017-07-31 NOTE — Progress Notes (Signed)
Patient has been assessed by two [facility] members. One attempt by each, with ultrasound, without success. Informed patient's nurse that PCP should be contacted to determine need for vascular access and type of access used.(PICC vs. CVC)

## 2017-07-31 NOTE — Progress Notes (Signed)
IV occluded.  This RN attempted x 2 and IV team to bedside with ultrasound unable to obtain IV access.  Dr. Tana Coast notified.  Orders received to change IV medications to PO.  Pt c/o pain.  Order received for Oxy IR 5mg  q6prn.  Noted cross allergy.  Pt states she has taken oxycodone in the past and has not had any problems.

## 2017-07-31 NOTE — Progress Notes (Signed)
Spoke to Dr. Tana Coast re: consult for [place]. Consult canceled. Will sign off Thank you,  Romona Curls, ANP

## 2017-07-31 NOTE — Progress Notes (Signed)
[facility]                                                                              Patient Demographics  Deborah [name], is a 52 y.o. female, DOB - 1965/06/07, VVO:[id number]  Admit date - 07/29/2017   Admitting Physician Roney Jaffe, MD  Outpatient Primary MD for the patient is Patient, No Pcp Per  Outpatient specialists:   LOS - 0  days   Medical records reviewed and are as summarized below:    Chief Complaint  Patient presents with  . Chest Pain       Brief summary   Patient is a 52 year old female with diabetes, history of gastroparesis, cholecystectomy, gout, fibromyalgia, morbid obesity, GERD, hypertension, hyperlipidemia, stroke in 2012 presented with acute on chronic abdominal pain for the last 2 weeks, worse in the last 2 days with intractable nausea and vomiting.  No hematemesis or hematochezia or melena.  No fevers or chills.  Patient also reports that she has been having diarrhea with multiple BMs.   Assessment & Plan    Principal Problem:   Intractable nausea and vomiting with diffuse abdominal pain,  -Has a history of diabetic gastroparesis.  CT angiogram of the chest negative for PE.  LFTs normal, lipase 26 -CT abdomen pelvis negative for any colitis or acute pathology, likely gastroparesis flare. -At the time of my encounter, patient appeared to be comfortably resting.  Start on clear liquid diet, continue IV fluids, pain control, PPI.  -Leukocytosis improving, no fevers, no diarrhea  Active Problems:   Essential hypertension, benign -BP somewhat elevated, continue metoprolol, restart amlodipine    Uncontrolled type 2 diabetes mellitus with diabetic neuropathy, with long-term current use of insulin (HCC) -A1c 8.5, uncontrolled CBGs  -Increase Lantus to units, added meal coverage 5 units 3 times daily AC, sliding scale insulin   Acute on chronic CKD (chronic kidney disease), stage II -Baseline creatinine 0.9, admitted with  creatinine of 1.3 likely due to dehydration, continue IV fluids -Creatinine improving      Anemia, normocytic H&H currently at baseline    GERD (gastroesophageal reflux disease) Continue Pepcid 20 mg IV every 12 hours  Morbidly obese BMI 49, counseled on diet and weight control  Hypokalemia Replaced-   Code Status: Full CODE STATUS  DVT Prophylaxis: Heparin subcu Family Communication: Discussed in detail with the patient, all imaging results, lab results explained to the patient   Disposition Plan: When medically ready  Time Spent in minutes 25 minutes  Procedures:  CT angiogram chest negative for PE  Consultants:   None  Antimicrobials:      Medications  Scheduled Meds: . allopurinol  100 mg Oral Daily  . DULoxetine  30 mg Oral BID  . heparin  5,000 Units Subcutaneous Q8H  . insulin aspart  0-15 Units Subcutaneous TID WC  . insulin aspart  0-5 Units Subcutaneous QHS  . insulin aspart  3 Units Subcutaneous TID WC  . insulin glargine  24 Units Subcutaneous QHS  . metoCLOPramide (REGLAN) injection  10 mg Intravenous Q6H  . metoprolol succinate  25 mg Oral Daily  . ondansetron (ZOFRAN) IV  4 mg Intravenous Q8H  . ondansetron (ZOFRAN) IV  4 mg Intravenous Once  . sucralfate  1 g Oral TID WC & HS   Continuous Infusions: . sodium chloride 125 mL/hr at 07/31/17 0852  . famotidine (PEPCID) IV Stopped (07/31/17 0928)   PRN Meds:.bisacodyl, gi cocktail, hyoscyamine, morphine injection, senna-docusate   Antibiotics   Anti-infectives (From admission, onward)   None        Subjective:   Deborah [name] was seen and examined today.  Comfortably resting at the time of my encounter, no vomiting, no diarrhea.  Still complaining of abdominal pain.  No fevers or chills.  No chest pain or shortness of breath, dizziness. Objective:   Vitals:   07/31/17 0400 07/31/17 0500 07/31/17 0805 07/31/17 1137  BP: (!) 159/77  (!) 162/101 (!) 151/91  Pulse:   (!) 109     Resp: (!) 23  (!) 21 10  Temp:   98.6 F (37 C) 98.9 F (37.2 C)  TempSrc:   Oral Oral  SpO2: 100%  99% 99%  Weight:  (!) 138.6 kg (305 lb 8.9 oz)    Height:        Intake/Output Summary (Last 24 hours) at 07/31/2017 1204 Last data filed at 07/30/2017 2315 Gross per 24 hour  Intake 0 ml  Output 450 ml  Net -450 ml     Wt Readings from Last 3 Encounters:  07/31/17 (!) 138.6 kg (305 lb 8.9 oz)  03/19/17 (!) 140.6 kg (310 lb)  08/16/16 133.6 kg (294 lb 8.6 oz)     Exam   General: Alert and oriented x 3, NAD  Eyes:   HEENT:  Atraumatic, normocephalic  Cardiovascular: S1 S2 auscultated,  Regular rate and rhythm. No pedal edema b/l  Respiratory: Clear to auscultation bilaterally, no wheezing, rales or rhonchi  Gastrointestinal: Soft, mild diffuse tenderness to deep palpation nondistended, + bowel sounds  Ext: no pedal edema bilaterally  Neuro: no new deficit  Musculoskeletal: No digital cyanosis, clubbing  Skin: No rashes  Psych: Normal affect and demeanor, alert and oriented x3    Data Reviewed:  I have personally reviewed following labs and imaging studies  Micro Results Recent Results (from the past 240 hour(s))  Culture, blood (routine x 2)     Status: None (Preliminary result)   Collection Time: 07/30/17  9:09 AM  Result Value Ref Range Status   Specimen Description BLOOD LEFT ANTECUBITAL  Final   Special Requests   Final    BOTTLES DRAWN AEROBIC ONLY Blood Culture adequate volume   Culture   Final    NO GROWTH < 24 HOURS Performed at [facility], [address], [place], [place] [address]    Report Status PENDING  Incomplete  Culture, blood (routine x 2)     Status: None (Preliminary result)   Collection Time: 07/30/17  9:09 AM  Result Value Ref Range Status   Specimen Description BLOOD RIGHT ANTECUBITAL  Final   Special Requests   Final    BOTTLES DRAWN AEROBIC ONLY Blood Culture adequate volume   Culture   Final    NO GROWTH < 24  HOURS Performed at [facility], [place] [address], [place], [place] [address]    Report Status PENDING  Incomplete    Radiology Reports Dg Chest 2 View  Result Date: 07/29/2017 CLINICAL DATA:  Chest pain and shortness of breath for the past week. EXAM: CHEST - 2 VIEW COMPARISON:  07/04/2016; 05/25/2015; chest CT-05/26/2015  FINDINGS: Grossly unchanged cardiac silhouette and mediastinal contours. No focal parenchymal opacities. No pleural effusion or pneumothorax. No evidence of edema. No acute osseus abnormalities. Suspected degenerative change of the bilateral glenohumeral and acromioclavicular joints, incompletely evaluated. Post cholecystectomy. IMPRESSION: No acute cardiopulmonary disease. Electronically Signed   By: [name] M.D.   On: 07/29/2017 10:40   Ct Angio Chest Pe W/cm &/or Wo Cm  Result Date: 07/29/2017 CLINICAL DATA:  Chest pain and shortness of breath. Elevated D-dimer. EXAM: CT ANGIOGRAPHY CHEST WITH CONTRAST TECHNIQUE: Multidetector CT imaging of the chest was performed using the standard protocol during bolus administration of intravenous contrast. Multiplanar CT image reconstructions and MIPs were obtained to evaluate the vascular anatomy. CONTRAST:  100 mL ISOVUE-370 IOPAMIDOL (ISOVUE-370) INJECTION 76% COMPARISON:  Chest radiograph earlier today.  CT chest 05/26/2015. FINDINGS: Cardiovascular: Satisfactory opacification of the pulmonary arteries to the segmental level. No evidence of pulmonary embolism. Mild cardiac enlargement. No pericardial effusion. Mild calcification of the transverse arch, not involving great vessels. Mediastinum/Nodes: No enlarged mediastinal, hilar, or axillary lymph nodes. Thyroid gland, trachea, and esophagus demonstrate no significant findings. Lungs/Pleura: Lungs are clear. No pleural effusion or pneumothorax. Upper Abdomen: Fatty infiltration of the liver. Musculoskeletal: No worrisome osseous lesion.  Thoracic spondylosis. Review of the MIP  images confirms the above findings. IMPRESSION: No evidence for pulmonary emboli. Mild cardiac enlargement. No visible lung opacity. Aortic Atherosclerosis (ICD10-I70.0). Electronically Signed   By: Staci Righter M.D.   On: 07/29/2017 15:35   Ct Abdomen Pelvis W Contrast  Result Date: 07/30/2017 CLINICAL DATA:  Generalized abdominal pain. EXAM: CT ABDOMEN AND PELVIS WITH CONTRAST TECHNIQUE: Multidetector CT imaging of the abdomen and pelvis was performed using the standard protocol following bolus administration of intravenous contrast. CONTRAST:  172mL OMNIPAQUE IOHEXOL 300 MG/ML  SOLN COMPARISON:  Body CT 07/04/2016 FINDINGS: Lower chest: No acute abnormality. Hepatobiliary: Hepatic steatosis. Postcholecystectomy. No evidence of biliary ductal dilation. Pancreas: Unremarkable. No pancreatic ductal dilatation or surrounding inflammatory changes. Spleen: Normal in size without focal abnormality. Adrenals/Urinary Tract: Adrenal glands are unremarkable. Kidneys are normal, without renal calculi, focal lesion, or hydronephrosis. Bladder is unremarkable. Stomach/Bowel: Stomach is within normal limits. Appendix appears normal. No evidence of bowel wall thickening, distention, or inflammatory changes. Vascular/Lymphatic: No significant vascular findings are present. No enlarged abdominal or pelvic lymph nodes. Reproductive: Leiomyomatous uterus.  No evidence of adnexal masses. Other: No abdominal wall hernia or abnormality. No abdominopelvic ascites. Musculoskeletal: L4-L5 and L5-S1 spondylosis. IMPRESSION: Hepatic steatosis. No evidence of acute abnormalities within the solid abdominal organs. Leiomyomatous uterus. L4-L5 and L5-S1 spondylosis. Electronically Signed   By: Fidela Salisbury M.D.   On: 07/30/2017 14:44    Lab Data:  CBC: Recent Labs  Lab 07/27/17 1331 07/29/17 1026 07/30/17 0515 07/31/17 0214  WBC 11.8* 11.0* 17.5* 16.7*  HGB 10.6* 11.0* 10.5* 10.2*  HCT 33.1* 34.5* 33.5* 32.7*  MCV  87.1 86.7 88.6 88.1  PLT 439* 466* 412* 952*   Basic Metabolic Panel: Recent Labs  Lab 07/27/17 1331 07/29/17 1026 07/30/17 0515 07/31/17 0214  NA 139 139 139 141  K 3.0* 3.0* 3.5 3.2*  CL 104 103 103 107  CO2 22 24 23 25   GLUCOSE 196* 202* 377* 237*  BUN 21* 14 13 12   CREATININE 1.38* 1.17* 1.24* 1.20*  CALCIUM 9.1 9.4 8.9 8.8*   GFR: Estimated Creatinine Clearance: 77.9 mL/min (A) (by C-G formula based on SCr of 1.2 mg/dL (H)). Liver Function Tests: Recent Labs  Lab 07/27/17 1331 07/29/17  1026  AST 17 16  ALT 13* 13*  ALKPHOS 94 98  BILITOT 0.6 1.0  PROT 8.1 7.8  ALBUMIN 3.6 3.5   Recent Labs  Lab 07/27/17 1331 07/29/17 1026  LIPASE 28 26   No results for input(s): AMMONIA in the last 168 hours. Coagulation Profile: No results for input(s): INR, PROTIME in the last 168 hours. Cardiac Enzymes: No results for input(s): CKTOTAL, CKMB, CKMBINDEX, TROPONINI in the last 168 hours. BNP (last 3 results) No results for input(s): PROBNP in the last 8760 hours. HbA1C: Recent Labs    07/30/17 0515  HGBA1C 8.5*   CBG: Recent Labs  Lab 07/30/17 1731 07/30/17 2141 07/31/17 0107 07/31/17 0431 07/31/17 0803  GLUCAP 304* 235* 235* 222* 219*   Lipid Profile: No results for input(s): CHOL, HDL, LDLCALC, TRIG, CHOLHDL, LDLDIRECT in the last 72 hours. Thyroid Function Tests: No results for input(s): TSH, T4TOTAL, FREET4, T3FREE, THYROIDAB in the last 72 hours. Anemia Panel: No results for input(s): VITAMINB12, FOLATE, FERRITIN, TIBC, IRON, RETICCTPCT in the last 72 hours. Urine analysis:    Component Value Date/Time   COLORURINE YELLOW 07/30/2017 [place] 07/30/2017 1232   LABSPEC 1.021 07/30/2017 1232   PHURINE 6.0 07/30/2017 1232   GLUCOSEU >=500 (A) 07/30/2017 1232   HGBUR MODERATE (A) 07/30/2017 1232   BILIRUBINUR NEGATIVE 07/30/2017 1232   KETONESUR 20 (A) 07/30/2017 1232   PROTEINUR 100 (A) 07/30/2017 1232   UROBILINOGEN 0.2  10/02/2013 2108   NITRITE NEGATIVE 07/30/2017 1232   LEUKOCYTESUR NEGATIVE 07/30/2017 1232     Alma Muegge M.D. [facility] 07/31/2017, 12:04 PM  Pager: [phone] Between 7am to 7pm - call Pager - [phone]  After 7pm go to [email or web address] - password [email or web address]  Call night coverage person covering after 7pm

## 2017-08-01 LAB — GLUCOSE, CAPILLARY
GLUCOSE-CAPILLARY: 213 mg/dL — AB (ref 65–99)
GLUCOSE-CAPILLARY: 227 mg/dL — AB (ref 65–99)
GLUCOSE-CAPILLARY: 327 mg/dL — AB (ref 65–99)
Glucose-Capillary: 252 mg/dL — ABNORMAL HIGH (ref 65–99)
Glucose-Capillary: 254 mg/dL — ABNORMAL HIGH (ref 65–99)
Glucose-Capillary: 175 mg/dL — ABNORMAL HIGH (ref 65–99)

## 2017-08-01 LAB — BASIC METABOLIC PANEL
Anion gap: 10 (ref 5–15)
BUN: 9 mg/dL (ref 6–20)
CHLORIDE: 104 mmol/L (ref 101–111)
CO2: 24 mmol/L (ref 22–32)
Calcium: 8.3 mg/dL — ABNORMAL LOW (ref 8.9–10.3)
Creatinine, Ser: 1.01 mg/dL — ABNORMAL HIGH (ref 0.44–1.00)
GFR calc Af Amer: >60 mL/min (ref >60)
GFR calc non Af Amer: >60 mL/min (ref >60)
GLUCOSE: 247 mg/dL — AB (ref 65–99)
POTASSIUM: 3 mmol/L — AB (ref 3.5–5.1)
SODIUM: 138 mmol/L (ref 135–145)

## 2017-08-01 LAB — CBC
HCT: 32.6 % — ABNORMAL LOW (ref 36.0–46.0)
HEMOGLOBIN: 10.3 g/dL — AB (ref 12.0–15.0)
MCH: 27.5 pg (ref 26.0–34.0)
MCHC: 31.6 g/dL (ref 30.0–36.0)
MCV: 86.9 fL (ref 78.0–100.0)
Platelets: 391 10*3/uL (ref 150–400)
RBC: 3.75 MIL/uL — AB (ref 3.87–5.11)
RDW: 14.5 % (ref 11.5–15.5)
WBC: 14.3 10*3/uL — ABNORMAL HIGH (ref 4.0–10.5)

## 2017-08-01 MED ORDER — INSULIN GLARGINE 100 UNIT/ML ~~LOC~~ SOLN
35.0000 [IU] | Freq: Every day | SUBCUTANEOUS | Status: DC
  Administered 2017-08-01: 35 [IU] via SUBCUTANEOUS
  Filled 2017-08-01: qty 0.35

## 2017-08-01 MED ORDER — POTASSIUM CHLORIDE CRYS ER 20 MEQ PO TBCR
40.0000 meq | EXTENDED_RELEASE_TABLET | Freq: Once | ORAL | Status: AC
  Administered 2017-08-01: 40 meq via ORAL
  Filled 2017-08-01: qty 2

## 2017-08-01 MED ORDER — OXYCODONE HCL 5 MG PO TABS
5.0000 mg | ORAL_TABLET | ORAL | Status: DC | PRN
  Administered 2017-08-01 – 2017-08-02 (×4): 5 mg via ORAL
  Filled 2017-08-01 (×4): qty 1

## 2017-08-01 MED ORDER — METOCLOPRAMIDE HCL 5 MG/ML IJ SOLN
10.0000 mg | Freq: Three times a day (TID) | INTRAMUSCULAR | Status: DC
  Administered 2017-08-01 – 2017-08-02 (×6): 10 mg via INTRAVENOUS
  Filled 2017-08-01 (×6): qty 2

## 2017-08-01 MED ORDER — FAMOTIDINE 20 MG PO TABS
20.0000 mg | ORAL_TABLET | Freq: Two times a day (BID) | ORAL | Status: DC
  Administered 2017-08-01 – 2017-08-02 (×3): 20 mg via ORAL
  Filled 2017-08-01 (×3): qty 1

## 2017-08-01 MED ORDER — POLYETHYLENE GLYCOL 3350 17 G PO PACK
17.0000 g | PACK | Freq: Once | ORAL | Status: AC
  Administered 2017-08-01: 17 g via ORAL
  Filled 2017-08-01: qty 1

## 2017-08-01 MED ORDER — ONDANSETRON HCL 4 MG/2ML IJ SOLN: 4.0000 mg | INTRAMUSCULAR | Status: DC | PRN

## 2017-08-01 NOTE — Progress Notes (Signed)
[facility]                                                                              Patient Demographics  Deborah [name], is a 52 y.o. female, DOB - Jun 14, 1965, MOQ:[id number]  Admit date - 07/29/2017   Admitting Physician Roney Jaffe, MD  Outpatient Primary MD for the patient is Patient, No Pcp Per  Outpatient specialists:   LOS - 1  days   Medical records reviewed and are as summarized below:    Chief Complaint  Patient presents with  . Chest Pain       Brief summary   Patient is a 52 year old female with diabetes, history of gastroparesis, cholecystectomy, gout, fibromyalgia, morbid obesity, GERD, hypertension, hyperlipidemia, stroke in 2012 presented with acute on chronic abdominal pain for the last 2 weeks, worse in the last 2 days with intractable nausea and vomiting.  No hematemesis or hematochezia or melena.  No fevers or chills.  Patient also reports that she has been having diarrhea with multiple BMs.   Assessment & Plan    Principal Problem:   Intractable nausea and vomiting with diffuse abdominal pain,  -Has a history of diabetic gastroparesis.  CT angiogram of the chest negative for PE.  LFTs normal, lipase 26 -CT abdomen pelvis negative for any colitis or acute pathology, likely gastroparesis flare. -Patient requesting for pain medications, explained to the patient that narcotics will further cause delayed emptying and worsening of gastroparesis.  -Patient refused full liquid diet, wants to try solids, advance diet to solids, DC IV fluids. -Continue IV Reglan, Zofran, encourage oral diet, discontinued IV narcotics    Active Problems:   Essential hypertension, benign -Continue Toprol, amlodipine     Uncontrolled type 2 diabetes mellitus with diabetic neuropathy, with long-term current use of insulin (HCC) -A1c 8.5, -CBG still elevated, increase Lantus to 35 units at bedtime ( takes 40 units at home) -Continue meal coverage 5  units 3 times daily AC, sliding scale insulin   Acute on chronic CKD (chronic kidney disease), stage II -Baseline creatinine 0.9, admitted with creatinine of 1.3 likely due to dehydration -Creatinine stable, 1.0, discontinue IV fluids, encourage p.o. diet      Anemia, normocytic H&H currently at baseline    GERD (gastroesophageal reflux disease) Continue Pepcid 20 mg IV every 12 hours  Morbidly obese BMI 49, counseled on diet and weight control  Hypokalemia Replaced  Code Status: Full CODE STATUS  DVT Prophylaxis: Heparin subcu Family Communication: Discussed in detail with the patient, all imaging results, lab results explained to the patient   Disposition Plan: Hopefully DC home in a.m. if tolerating solid diet  Time Spent in minutes 25 minutes  Procedures:  CT angiogram chest negative for PE  Consultants:   None  Antimicrobials:      Medications  Scheduled Meds: . allopurinol  100 mg Oral Daily  . amLODipine  10 mg Oral Daily  . DULoxetine  30 mg Oral BID  . famotidine  20 mg Oral BID  . heparin  5,000 Units Subcutaneous Q8H  . insulin aspart  0-15 Units Subcutaneous TID WC  . insulin aspart  0-5 Units Subcutaneous QHS  . insulin aspart  5 Units Subcutaneous TID WC  . insulin glargine  24 Units Subcutaneous QHS  . metoCLOPramide (REGLAN) injection  10 mg Intravenous TID AC & HS  . metoprolol succinate  25 mg Oral Daily  . sucralfate  1 g Oral TID WC & HS   Continuous Infusions:  PRN Meds:.bisacodyl, gi cocktail, hyoscyamine, ondansetron (ZOFRAN) IV, oxyCODONE, senna-docusate   Antibiotics   Anti-infectives (From admission, onward)   None        Subjective:   Deborah [name] was seen and examined today.  Asking for IV pain medications.  No vomiting or diarrhea.  Still complaining of abdominal pain, nausea.  Wants to try solid food.  No fevers or chills.  No chest pain or shortness of breath, dizziness.  Objective:   Vitals:   07/31/17  2338 08/01/17 0432 08/01/17 0500 08/01/17 0745  BP: (!) 145/79 (!) 137/93  (!) 142/93  Pulse:    (!) 103  Resp: 15 (!) 21  19  Temp: 98.6 F (37 C) 98.6 F (37 C)  98.2 F (36.8 C)  TempSrc: Oral Oral  Oral  SpO2:    98%  Weight:   (!) 140.3 kg (309 lb 4.9 oz)   Height:        Intake/Output Summary (Last 24 hours) at 08/01/2017 1106 Last data filed at 08/01/2017 0900 Gross per 24 hour  Intake 5465.83 ml  Output 2125 ml  Net 3340.83 ml     Wt Readings from Last 3 Encounters:  08/01/17 (!) 140.3 kg (309 lb 4.9 oz)  03/19/17 (!) 140.6 kg (310 lb)  08/16/16 133.6 kg (294 lb 8.6 oz)     Exam   General: Alert and oriented x 3, NAD  Eyes:   HEENT:  Atraumatic, normocephalic  Cardiovascular: S1 S2 auscultated, Regular rate and rhythm. No pedal edema b/l  Respiratory: Clear to auscultation bilaterally, no wheezing, rales or rhonchi  Gastrointestinal: Soft, mild diffuse tenderness, ND, NBS, obese  Ext: no pedal edema bilaterally  Neuro: no new deficit  Musculoskeletal: No digital cyanosis, clubbing  Skin: No rashes  Psych: Normal affect and demeanor, alert and oriented x3    Data Reviewed:  I have personally reviewed following labs and imaging studies  Micro Results Recent Results (from the past 240 hour(s))  Culture, blood (routine x 2)     Status: None (Preliminary result)   Collection Time: 07/30/17  9:09 AM  Result Value Ref Range Status   Specimen Description BLOOD LEFT ANTECUBITAL  Final   Special Requests   Final    BOTTLES DRAWN AEROBIC ONLY Blood Culture adequate volume   Culture   Final    NO GROWTH < 24 HOURS Performed at [facility], [address] [place], [place] [address]    Report Status PENDING  Incomplete  Culture, blood (routine x 2)     Status: None (Preliminary result)   Collection Time: 07/30/17  9:09 AM  Result Value Ref Range Status   Specimen Description BLOOD RIGHT ANTECUBITAL  Final   Special Requests   Final     BOTTLES DRAWN AEROBIC ONLY Blood Culture adequate volume   Culture   Final    NO GROWTH < 24 HOURS Performed at [facility], [place] [address] [place], [place] [address]    Report Status PENDING  Incomplete    Radiology Reports Dg Chest 2 View  Result Date: 07/29/2017 CLINICAL DATA:  Chest pain and  shortness of breath for the past week. EXAM: CHEST - 2 VIEW COMPARISON:  07/04/2016; 05/25/2015; chest CT-05/26/2015 FINDINGS: Grossly unchanged cardiac silhouette and mediastinal contours. No focal parenchymal opacities. No pleural effusion or pneumothorax. No evidence of edema. No acute osseus abnormalities. Suspected degenerative change of the bilateral glenohumeral and acromioclavicular joints, incompletely evaluated. Post cholecystectomy. IMPRESSION: No acute cardiopulmonary disease. Electronically Signed   By: [name] M.D.   On: 07/29/2017 10:40   Ct Angio Chest Pe W/cm &/or Wo Cm  Result Date: 07/29/2017 CLINICAL DATA:  Chest pain and shortness of breath. Elevated D-dimer. EXAM: CT ANGIOGRAPHY CHEST WITH CONTRAST TECHNIQUE: Multidetector CT imaging of the chest was performed using the standard protocol during bolus administration of intravenous contrast. Multiplanar CT image reconstructions and MIPs were obtained to evaluate the vascular anatomy. CONTRAST:  100 mL ISOVUE-370 IOPAMIDOL (ISOVUE-370) INJECTION 76% COMPARISON:  Chest radiograph earlier today.  CT chest 05/26/2015. FINDINGS: Cardiovascular: Satisfactory opacification of the pulmonary arteries to the segmental level. No evidence of pulmonary embolism. Mild cardiac enlargement. No pericardial effusion. Mild calcification of the transverse arch, not involving great vessels. Mediastinum/Nodes: No enlarged mediastinal, hilar, or axillary lymph nodes. Thyroid gland, trachea, and esophagus demonstrate no significant findings. Lungs/Pleura: Lungs are clear. No pleural effusion or pneumothorax. Upper Abdomen: Fatty infiltration of the  liver. Musculoskeletal: No worrisome osseous lesion.  Thoracic spondylosis. Review of the MIP images confirms the above findings. IMPRESSION: No evidence for pulmonary emboli. Mild cardiac enlargement. No visible lung opacity. Aortic Atherosclerosis (ICD10-I70.0). Electronically Signed   By: Staci Righter M.D.   On: 07/29/2017 15:35   Ct Abdomen Pelvis W Contrast  Result Date: 07/30/2017 CLINICAL DATA:  Generalized abdominal pain. EXAM: CT ABDOMEN AND PELVIS WITH CONTRAST TECHNIQUE: Multidetector CT imaging of the abdomen and pelvis was performed using the standard protocol following bolus administration of intravenous contrast. CONTRAST:  137mL OMNIPAQUE IOHEXOL 300 MG/ML  SOLN COMPARISON:  Body CT 07/04/2016 FINDINGS: Lower chest: No acute abnormality. Hepatobiliary: Hepatic steatosis. Postcholecystectomy. No evidence of biliary ductal dilation. Pancreas: Unremarkable. No pancreatic ductal dilatation or surrounding inflammatory changes. Spleen: Normal in size without focal abnormality. Adrenals/Urinary Tract: Adrenal glands are unremarkable. Kidneys are normal, without renal calculi, focal lesion, or hydronephrosis. Bladder is unremarkable. Stomach/Bowel: Stomach is within normal limits. Appendix appears normal. No evidence of bowel wall thickening, distention, or inflammatory changes. Vascular/Lymphatic: No significant vascular findings are present. No enlarged abdominal or pelvic lymph nodes. Reproductive: Leiomyomatous uterus.  No evidence of adnexal masses. Other: No abdominal wall hernia or abnormality. No abdominopelvic ascites. Musculoskeletal: L4-L5 and L5-S1 spondylosis. IMPRESSION: Hepatic steatosis. No evidence of acute abnormalities within the solid abdominal organs. Leiomyomatous uterus. L4-L5 and L5-S1 spondylosis. Electronically Signed   By: Fidela Salisbury M.D.   On: 07/30/2017 14:44    Lab Data:  CBC: Recent Labs  Lab 07/27/17 1331 07/29/17 1026 07/30/17 0515 07/31/17 0214  08/01/17 0240  WBC 11.8* 11.0* 17.5* 16.7* 14.3*  HGB 10.6* 11.0* 10.5* 10.2* 10.3*  HCT 33.1* 34.5* 33.5* 32.7* 32.6*  MCV 87.1 86.7 88.6 88.1 86.9  PLT 439* 466* 412* 420* 034   Basic Metabolic Panel: Recent Labs  Lab 07/27/17 1331 07/29/17 1026 07/30/17 0515 07/31/17 0214 08/01/17 0240  NA 139 139 139 141 138  K 3.0* 3.0* 3.5 3.2* 3.0*  CL 104 103 103 107 104  CO2 22 24 23 25 24   GLUCOSE 196* 202* 377* 237* 247*  BUN 21* 14 13 12 9   CREATININE 1.38* 1.17* 1.24* 1.20* 1.01*  CALCIUM 9.1 9.4 8.9 8.8* 8.3*   GFR: Estimated Creatinine Clearance: 93.3 mL/min (A) (by C-G formula based on SCr of 1.01 mg/dL (H)). Liver Function Tests: Recent Labs  Lab 07/27/17 1331 07/29/17 1026  AST 17 16  ALT 13* 13*  ALKPHOS 94 98  BILITOT 0.6 1.0  PROT 8.1 7.8  ALBUMIN 3.6 3.5   Recent Labs  Lab 07/27/17 1331 07/29/17 1026  LIPASE 28 26   No results for input(s): AMMONIA in the last 168 hours. Coagulation Profile: No results for input(s): INR, PROTIME in the last 168 hours. Cardiac Enzymes: No results for input(s): CKTOTAL, CKMB, CKMBINDEX, TROPONINI in the last 168 hours. BNP (last 3 results) No results for input(s): PROBNP in the last 8760 hours. HbA1C: Recent Labs    07/30/17 0515  HGBA1C 8.5*   CBG: Recent Labs  Lab 07/31/17 1724 07/31/17 2111 08/01/17 0034 08/01/17 0432 08/01/17 0834  GLUCAP 273* 176* 213* 252* 254*   Lipid Profile: No results for input(s): CHOL, HDL, LDLCALC, TRIG, CHOLHDL, LDLDIRECT in the last 72 hours. Thyroid Function Tests: No results for input(s): TSH, T4TOTAL, FREET4, T3FREE, THYROIDAB in the last 72 hours. Anemia Panel: No results for input(s): VITAMINB12, FOLATE, FERRITIN, TIBC, IRON, RETICCTPCT in the last 72 hours. Urine analysis:    Component Value Date/Time   COLORURINE YELLOW 07/30/2017 [place] 07/30/2017 1232   LABSPEC 1.021 07/30/2017 1232   PHURINE 6.0 07/30/2017 1232   GLUCOSEU >=500 (A)  07/30/2017 1232   HGBUR MODERATE (A) 07/30/2017 1232   BILIRUBINUR NEGATIVE 07/30/2017 1232   KETONESUR 20 (A) 07/30/2017 1232   PROTEINUR 100 (A) 07/30/2017 1232   UROBILINOGEN 0.2 10/02/2013 2108   NITRITE NEGATIVE 07/30/2017 1232   LEUKOCYTESUR NEGATIVE 07/30/2017 1232     Ken Bonn M.D. [facility] 08/01/2017, 11:06 AM  Pager: [phone] Between 7am to 7pm - call Pager - [phone]  After 7pm go to [email or web address] - password [email or web address]  Call night coverage person covering after 7pm

## 2017-08-02 ENCOUNTER — Telehealth: Payer: Self-pay

## 2017-08-02 LAB — CBC
HCT: 30.9 % — ABNORMAL LOW (ref 36.0–46.0)
Hemoglobin: 9.7 g/dL — ABNORMAL LOW (ref 12.0–15.0)
MCH: 27.6 pg (ref 26.0–34.0)
MCHC: 31.4 g/dL (ref 30.0–36.0)
MCV: 87.8 fL (ref 78.0–100.0)
PLATELETS: 365 10*3/uL (ref 150–400)
RBC: 3.52 MIL/uL — ABNORMAL LOW (ref 3.87–5.11)
RDW: 14.4 % (ref 11.5–15.5)
WBC: 14.4 10*3/uL — AB (ref 4.0–10.5)

## 2017-08-02 LAB — BASIC METABOLIC PANEL
ANION GAP: 10 (ref 5–15)
BUN: 10 mg/dL (ref 6–20)
CALCIUM: 8.3 mg/dL — AB (ref 8.9–10.3)
CO2: 28 mmol/L (ref 22–32)
Chloride: 101 mmol/L (ref 101–111)
Creatinine, Ser: 1.18 mg/dL — ABNORMAL HIGH (ref 0.44–1.00)
GFR, EST AFRICAN AMERICAN: 60 mL/min — AB (ref >60)
GFR, EST NON AFRICAN AMERICAN: 52 mL/min — AB (ref >60)
GLUCOSE: 165 mg/dL — AB (ref 65–99)
Potassium: 2.8 mmol/L — ABNORMAL LOW (ref 3.5–5.1)
SODIUM: 139 mmol/L (ref 135–145)

## 2017-08-02 LAB — MAGNESIUM: Magnesium: 1.3 mg/dL — ABNORMAL LOW (ref 1.7–2.4)

## 2017-08-02 LAB — GLUCOSE, CAPILLARY
GLUCOSE-CAPILLARY: 238 mg/dL — AB (ref 65–99)
Glucose-Capillary: 279 mg/dL — ABNORMAL HIGH (ref 65–99)
Glucose-Capillary: 173 mg/dL — ABNORMAL HIGH (ref 65–99)
Glucose-Capillary: 135 mg/dL — ABNORMAL HIGH (ref 65–99)

## 2017-08-02 LAB — POTASSIUM
POTASSIUM: 3.3 mmol/L — AB (ref 3.5–5.1)
Potassium: 3.5 mmol/L (ref 3.5–5.1)

## 2017-08-02 MED ORDER — INSULIN ASPART 100 UNIT/ML ~~LOC~~ SOLN
8.0000 [IU] | Freq: Three times a day (TID) | SUBCUTANEOUS | Status: DC
  Administered 2017-08-02 (×3): 8 [IU] via SUBCUTANEOUS

## 2017-08-02 MED ORDER — AMLODIPINE BESYLATE 10 MG PO TABS: 10.0000 mg | ORAL_TABLET | Freq: Every day | ORAL | 2 refills | Status: DC

## 2017-08-02 MED ORDER — SUCRALFATE 1 G PO TABS: 1.0000 g | ORAL_TABLET | Freq: Three times a day (TID) | ORAL | 1 refills | Status: DC

## 2017-08-02 MED ORDER — METOCLOPRAMIDE HCL 10 MG PO TABS: 10.0000 mg | ORAL_TABLET | Freq: Three times a day (TID) | ORAL | 2 refills | Status: DC

## 2017-08-02 MED ORDER — POTASSIUM CHLORIDE 10 MEQ/100ML IV SOLN
10.0000 meq | INTRAVENOUS | Status: AC
  Administered 2017-08-02 (×2): 10 meq via INTRAVENOUS

## 2017-08-02 MED ORDER — DICYCLOMINE HCL 10 MG PO CAPS: 10.0000 mg | ORAL_CAPSULE | Freq: Three times a day (TID) | ORAL | Status: DC

## 2017-08-02 MED ORDER — POTASSIUM CHLORIDE 10 MEQ/100ML IV SOLN
10.0000 meq | INTRAVENOUS | Status: DC
  Filled 2017-08-02 (×2): qty 100

## 2017-08-02 MED ORDER — INSULIN GLARGINE 100 UNIT/ML ~~LOC~~ SOLN
40.0000 [IU] | Freq: Every day | SUBCUTANEOUS | Status: DC
  Filled 2017-08-02: qty 0.4

## 2017-08-02 MED ORDER — HYOSCYAMINE SULFATE 0.125 MG SL SUBL: 0.2500 mg | SUBLINGUAL_TABLET | Freq: Four times a day (QID) | SUBLINGUAL | 0 refills | Status: DC | PRN

## 2017-08-02 MED ORDER — PANTOPRAZOLE SODIUM 40 MG PO TBEC: 40.0000 mg | DELAYED_RELEASE_TABLET | Freq: Two times a day (BID) | ORAL | 1 refills | Status: DC

## 2017-08-02 MED ORDER — DICYCLOMINE HCL 10 MG PO CAPS: 10.0000 mg | ORAL_CAPSULE | Freq: Four times a day (QID) | ORAL | 2 refills | Status: DC

## 2017-08-02 MED ORDER — POTASSIUM CHLORIDE ER 10 MEQ PO TBCR: 20.0000 meq | EXTENDED_RELEASE_TABLET | Freq: Every day | ORAL | 0 refills | Status: DC

## 2017-08-02 MED ORDER — POTASSIUM CHLORIDE CRYS ER 20 MEQ PO TBCR
40.0000 meq | EXTENDED_RELEASE_TABLET | ORAL | Status: AC
  Administered 2017-08-02 (×2): 40 meq via ORAL
  Filled 2017-08-02 (×2): qty 2

## 2017-08-02 MED ORDER — MAGNESIUM OXIDE -MG SUPPLEMENT 400 (240 MG) MG PO TABS: 400.0000 mg | ORAL_TABLET | Freq: Every day | ORAL | 0 refills | Status: DC

## 2017-08-02 MED ORDER — DICYCLOMINE HCL 10 MG PO CAPS
10.0000 mg | ORAL_CAPSULE | Freq: Four times a day (QID) | ORAL | Status: DC
  Administered 2017-08-02 (×3): 10 mg via ORAL
  Filled 2017-08-02 (×4): qty 1

## 2017-08-02 MED ORDER — METOPROLOL SUCCINATE ER 25 MG PO TB24: 25.0000 mg | ORAL_TABLET | Freq: Every day | ORAL | 2 refills | Status: DC

## 2017-08-02 MED ORDER — MAGNESIUM SULFATE 50 % IJ SOLN
4.0000 g | Freq: Once | INTRAVENOUS | Status: AC
  Administered 2017-08-02: 4 g via INTRAVENOUS
  Filled 2017-08-02: qty 8

## 2017-08-02 NOTE — Telephone Encounter (Signed)
Call received from Elenor Quinones, RN CM requesting a hospital follow up appointment for the patient. An appointment was scheduled with the [facility] for 08/08/2017 @0950  and the information was placed on the AVS.

## 2017-08-02 NOTE — Discharge Summary (Signed)
Physician Discharge Summary   Patient ID: Deborah [name] MRN: [id number] DOB/AGE: 1965-10-17 52 y.o.  Admit date: 07/29/2017 Discharge date: 08/02/2017  Primary Care Physician:  Patient, No Pcp Per   Recommendations for Outpatient Follow-up:  1. Follow up with PCP in 1-2 weeks, outpatient appointment scheduled with [facility] on August 08, 2017.  Patient will benefit from GI referral for gastroparesis.  Home Health: None  Equipment/Devices: None  Discharge Condition: stable  CODE STATUS: FULL Diet recommendation: Soft diet   Discharge Diagnoses:    Intractable nausea and vomiting, abdominal pain secondary to gastroparesis . Essential hypertension, benign . Dyslipidemia associated with type 2 diabetes mellitus ([place]) . CKD (chronic kidney disease), stage II . Uncontrolled diabetes mellitus type 2 with hyperglycemia  . Gout . GERD (gastroesophageal reflux disease) . Stroke (cerebrum) ([place]) . Acute on chronic CKD stage II . Anemia, normocytic, chronic Morbid obesity Hypokalemia with hypomagnesemia   Consults: None    Allergies:   Allergies  Allergen Reactions  . Diazepam [name]  . Lisinopril Anaphylaxis    Tongue and mouth swelling  . Penicillins Palpitations    Has patient had a PCN reaction causing immediate rash, facial/tongue/throat swelling, SOB or lightheadedness with hypotension: Yes, heart races Has patient had a PCN reaction causing severe rash involving mucus membranes or skin necrosis: No Has patient had a PCN reaction that required hospitalization: Yes  Has patient had a PCN reaction occurring within the last 10 years: No   . Acetaminophen Nausea Only and Other (See Comments)    Irritates stomach ulcer  Abdominal pain  . Tolmetin Nausea Only    Other reaction(s): Other (See Comments) ULCER  . Aspirin Other (See Comments)    Irritates stomach ulcer   . Food Hives and Swelling    Carrots, ketchup   . Nsaids Other (See  Comments)    ULCER  . Tramadol Nausea And Vomiting     DISCHARGE MEDICATIONS: Allergies as of 08/02/2017      Reactions   Diazepam [name]   Lisinopril Anaphylaxis   Tongue and mouth swelling   Penicillins Palpitations   Has patient had a PCN reaction causing immediate rash, facial/tongue/throat swelling, SOB or lightheadedness with hypotension: Yes, heart races Has patient had a PCN reaction causing severe rash involving mucus membranes or skin necrosis: No Has patient had a PCN reaction that required hospitalization: Yes  Has patient had a PCN reaction occurring within the last 10 years: No   Acetaminophen Nausea Only, Other (See Comments)   Irritates stomach ulcer  Abdominal pain   Tolmetin Nausea Only   Other reaction(s): Other (See Comments) ULCER   Aspirin Other (See Comments)   Irritates stomach ulcer    Food Hives, Swelling   Carrots, ketchup    Nsaids Other (See Comments)   ULCER   Tramadol Nausea And Vomiting      Medication List    STOP taking these medications   albuterol 108 (90 Base) MCG/ACT inhaler Commonly known as:  PROVENTIL HFA;VENTOLIN HFA   diclofenac sodium 1 % Gel Commonly known as:  VOLTAREN   nitroGLYCERIN 0.4 MG SL tablet Commonly known as:  NITROSTAT   NOVOLOG MIX 70/30 (70-30) 100 UNIT/ML injection Generic drug:  insulin aspart protamine- aspart   oxyCODONE 5 MG immediate release tablet Commonly known as:  Oxy IR/ROXICODONE     TAKE these medications   allopurinol 100 MG tablet Commonly known as:  ZYLOPRIM Take 1 tablet (100 mg total)  by mouth daily.   amLODipine 10 MG tablet Commonly known as:  NORVASC Take 1 tablet (10 mg total) by mouth daily.   dicyclomine 10 MG capsule Commonly known as:  BENTYL Take 1 capsule (10 mg total) by mouth 4 (four) times daily.   DULoxetine 30 MG capsule Commonly known as:  CYMBALTA Take 1 capsule (30 mg total) by mouth 2 (two) times daily.   hyoscyamine 0.125 MG SL  tablet Commonly known as:  LEVSIN SL Place 2 tablets (0.25 mg total) under the tongue every 6 (six) hours as needed for cramping (abdominal pain.).   insulin lispro 100 UNIT/ML injection Commonly known as:  HUMALOG Inject 10 Units into the skin 3 (three) times daily.   LANTUS 100 UNIT/ML injection Generic drug:  insulin glargine Inject 40 Units into the skin at bedtime.   Magnesium Oxide 400 (240 Mg) MG Tabs Take 1 tablet (400 mg total) by mouth daily. What changed:  when to take this   metoCLOPramide 10 MG tablet Commonly known as:  REGLAN Take 1 tablet (10 mg total) by mouth 3 (three) times daily before meals.   metoprolol succinate 25 MG 24 hr tablet Commonly known as:  TOPROL-XL Take 1 tablet (25 mg total) by mouth daily.   pantoprazole 40 MG tablet Commonly known as:  PROTONIX Take 1 tablet (40 mg total) by mouth 2 (two) times daily.   potassium chloride 10 MEQ tablet Commonly known as:  K-DUR Take 2 tablets (20 mEq total) by mouth daily for 7 days.   sucralfate 1 g tablet Commonly known as:  CARAFATE Take 1 tablet (1 g total) by mouth 4 (four) times daily -  with meals and at bedtime.        Brief H and P: For complete details please refer to admission H and P, but in brief Patient is a 52 year old female with diabetes, history of gastroparesis, cholecystectomy, gout, fibromyalgia, morbid obesity, GERD, hypertension, hyperlipidemia, stroke in 2012 presented with acute on chronic abdominal pain for the last 2 weeks, worse in the last 2 days with intractable nausea and vomiting.  No hematemesis or hematochezia or melena.  No fevers or chills.  Patient also reports that she has been having diarrhea with multiple BMs.  Hospital Course:   Intractable nausea and vomiting with diffuse abdominal pain,  -Has a history of diabetic gastroparesis.  CT angiogram of the chest negative for PE.  LFTs normal, lipase 26 -CT abdomen pelvis negative for any colitis or acute  pathology, likely gastroparesis flare. -Patient had requested for narcotics during this admission and was explained that narcotics will further cause delayed emptying and worsening of gastroparesis.  -Patient was placed on Reglan, Zofran, Bentyl.  She is tolerating soft solids -She will benefit from outpatient GI work-up.     Essential hypertension, benign -Continue Toprol, amlodipine     Uncontrolled type 2 diabetes mellitus with diabetic neuropathy, with long-term current use of insulin (H[facility]) -A1c 8.5, -Continue insulin regimen at home, Lantus 40 units at bedtime, Humalog 10 units 3 times daily AC.  Follow outpatient with PCP.   Acute on chronic CKD (chronic kidney disease), stage II -Baseline creatinine 0.9, admitted with creatinine of 1.3 likely due to dehydration -Creatinine stable 1.1.  IV fluids discontinued       Anemia, normocytic H&H currently at baseline    GERD (gastroesophageal reflux disease) Continue Protonix  Morbidly obese BMI 49, counseled on diet and weight control  Hypokalemia, hypomagnesemia Replaced IV and PO  History of stroke with no residual deficit Patient has reported allergy to aspirin and statins in the past.  Plavix was discontinued in January 2019 when patient was admitted at [facility].   Day of Discharge S: Tolerating solid diet, no fevers or chills, no nausea vomiting  BP 119/83 (BP Location: Right Arm)   Pulse 90   Temp (!) 97.5 F (36.4 C) (Oral)   Resp 19   Ht 5\' 6"  (1.676 m)   Wt (!) 139.6 kg (307 lb 12.2 oz)   LMP 10/10/2012   SpO2 97%   BMI 49.67 kg/m   Physical Exam: General: Alert and awake oriented x3 not in any acute distress. HEENT: anicteric sclera, pupils reactive to light and accommodation CVS: S1-S2 clear no murmur rubs or gallops Chest: clear to auscultation bilaterally, no wheezing rales or rhonchi Abdomen: soft mild upper abdominal tenderness, no rebound or guarding, nondistended, normal bowel  sounds Extremities: no cyanosis, clubbing or edema noted bilaterally Neuro: no new deficit  The results of significant diagnostics from this hospitalization (including imaging, microbiology, ancillary and laboratory) are listed below for reference.      Procedures/Studies:  Dg Chest 2 View  Result Date: 07/29/2017 CLINICAL DATA:  Chest pain and shortness of breath for the past week. EXAM: CHEST - 2 VIEW COMPARISON:  07/04/2016; 05/25/2015; chest CT-05/26/2015 FINDINGS: Grossly unchanged cardiac silhouette and mediastinal contours. No focal parenchymal opacities. No pleural effusion or pneumothorax. No evidence of edema. No acute osseus abnormalities. Suspected degenerative change of the bilateral glenohumeral and acromioclavicular joints, incompletely evaluated. Post cholecystectomy. IMPRESSION: No acute cardiopulmonary disease. Electronically Signed   By: [name] M.D.   On: 07/29/2017 10:40   Ct Angio Chest Pe W/cm &/or Wo Cm  Result Date: 07/29/2017 CLINICAL DATA:  Chest pain and shortness of breath. Elevated D-dimer. EXAM: CT ANGIOGRAPHY CHEST WITH CONTRAST TECHNIQUE: Multidetector CT imaging of the chest was performed using the standard protocol during bolus administration of intravenous contrast. Multiplanar CT image reconstructions and MIPs were obtained to evaluate the vascular anatomy. CONTRAST:  100 mL ISOVUE-370 IOPAMIDOL (ISOVUE-370) INJECTION 76% COMPARISON:  Chest radiograph earlier today.  CT chest 05/26/2015. FINDINGS: Cardiovascular: Satisfactory opacification of the pulmonary arteries to the segmental level. No evidence of pulmonary embolism. Mild cardiac enlargement. No pericardial effusion. Mild calcification of the transverse arch, not involving great vessels. Mediastinum/Nodes: No enlarged mediastinal, hilar, or axillary lymph nodes. Thyroid gland, trachea, and esophagus demonstrate no significant findings. Lungs/Pleura: Lungs are clear. No pleural effusion or pneumothorax.  Upper Abdomen: Fatty infiltration of the liver. Musculoskeletal: No worrisome osseous lesion.  Thoracic spondylosis. Review of the MIP images confirms the above findings. IMPRESSION: No evidence for pulmonary emboli. Mild cardiac enlargement. No visible lung opacity. Aortic Atherosclerosis (ICD10-I70.0). Electronically Signed   By: Staci Righter M.D.   On: 07/29/2017 15:35   Ct Abdomen Pelvis W Contrast  Result Date: 07/30/2017 CLINICAL DATA:  Generalized abdominal pain. EXAM: CT ABDOMEN AND PELVIS WITH CONTRAST TECHNIQUE: Multidetector CT imaging of the abdomen and pelvis was performed using the standard protocol following bolus administration of intravenous contrast. CONTRAST:  140mL OMNIPAQUE IOHEXOL 300 MG/ML  SOLN COMPARISON:  Body CT 07/04/2016 FINDINGS: Lower chest: No acute abnormality. Hepatobiliary: Hepatic steatosis. Postcholecystectomy. No evidence of biliary ductal dilation. Pancreas: Unremarkable. No pancreatic ductal dilatation or surrounding inflammatory changes. Spleen: Normal in size without focal abnormality. Adrenals/Urinary Tract: Adrenal glands are unremarkable. Kidneys are normal, without renal calculi, focal lesion, or hydronephrosis. Bladder is unremarkable. Stomach/Bowel: Stomach is  within normal limits. Appendix appears normal. No evidence of bowel wall thickening, distention, or inflammatory changes. Vascular/Lymphatic: No significant vascular findings are present. No enlarged abdominal or pelvic lymph nodes. Reproductive: Leiomyomatous uterus.  No evidence of adnexal masses. Other: No abdominal wall hernia or abnormality. No abdominopelvic ascites. Musculoskeletal: L4-L5 and L5-S1 spondylosis. IMPRESSION: Hepatic steatosis. No evidence of acute abnormalities within the solid abdominal organs. Leiomyomatous uterus. L4-L5 and L5-S1 spondylosis. Electronically Signed   By: Fidela Salisbury M.D.   On: 07/30/2017 14:44      LAB RESULTS: Basic Metabolic Panel: Recent Labs  Lab  08/01/17 0240 08/02/17 0356 08/02/17 0737 08/02/17 1508  NA 138 139  --   --   K 3.0* 2.8*  --  3.3*  CL 104 101  --   --   CO2 24 28  --   --   GLUCOSE 247* 165*  --   --   BUN 9 10  --   --   CREATININE 1.01* 1.18*  --   --   CALCIUM 8.3* 8.3*  --   --   MG  --   --  1.3*  --    Liver Function Tests: Recent Labs  Lab 07/27/17 1331 07/29/17 1026  AST 17 16  ALT 13* 13*  ALKPHOS 94 98  BILITOT 0.6 1.0  PROT 8.1 7.8  ALBUMIN 3.6 3.5   Recent Labs  Lab 07/27/17 1331 07/29/17 1026  LIPASE 28 26   No results for input(s): AMMONIA in the last 168 hours. CBC: Recent Labs  Lab 08/01/17 0240 08/02/17 0356  WBC 14.3* 14.4*  HGB 10.3* 9.7*  HCT 32.6* 30.9*  MCV 86.9 87.8  PLT 391 365   Cardiac Enzymes: No results for input(s): CKTOTAL, CKMB, CKMBINDEX, TROPONINI in the last 168 hours. BNP: Invalid input(s): POCBNP CBG: Recent Labs  Lab 08/02/17 1134 08/02/17 1635  GLUCAP 135* 238*      Disposition and Follow-up: Discharge Instructions    Diet Carb Modified   Complete by:  As directed    Increase activity slowly   Complete by:  As directed        DISPOSITION: *Home   DISCHARGE FOLLOW-UP Follow-up Information    [place]. Go on 08/08/2017.   Why:  at 9:50am for an appointment with Dr Margarita Rana. Please bring all of your medications with you to the appointment.  Contact information: [address] [place] [address] [phone]           Time coordinating discharge:  25 mins  Signed:   Estill Cotta M.D. [facility] 08/02/2017, 5:29 PM Pager: [phone]

## 2017-08-02 NOTE — Progress Notes (Signed)
Pt being discharged home via wheelchair with family. Pt alert and oriented x4. VSS. Pt c/o no pain at this time. No signs of respiratory distress. Education complete and care plans resolved. PIVx2 removed with catheter intact and pt tolerated well. No further issues at this time. Pt to follow up with PCP. Leanne Chang, RN

## 2017-08-03 ENCOUNTER — Telehealth: Payer: Self-pay

## 2017-08-03 DIAGNOSIS — M48061 Spinal stenosis, lumbar region without neurogenic claudication: Secondary | ICD-10-CM | POA: Insufficient documentation

## 2017-08-03 DIAGNOSIS — M48062 Spinal stenosis, lumbar region with neurogenic claudication: Secondary | ICD-10-CM | POA: Insufficient documentation

## 2017-08-03 NOTE — Telephone Encounter (Addendum)
[facility] Post-discharge Follow-Up Phone Call:  Date of Discharge: 08/02/2017 Principal Discharge Diagnosis(es): gastroparesis, uncontrolled DM Post-discharge Communication: (Clearly document all attempts clearly and date contact made) call placed to the patient Call Completed: Yes                    With Whom: patient Interpreter Needed: No     Please check all that apply:  X  Patient is knowledgeable of his/her condition(s) and/or treatment. ? Patient is caring for self at home.  X  Patient is receiving assist at home from family and/or caregiver. Family and/or caregiver is knowledgeable of patient's condition(s) and/or treatment. - she said that she has help at home but did not expand on that statement.  ? Patient is receiving home health services. If so, name of agency.     Medication Reconciliation:  X  Medication list reviewed with patient. - She said that she has the discharge instructions but did not have the list with her at the time of this  call  Instructed her to review the list when she is able to get to it and especially notice the changes from her prior medication regime.  - medications to stop, to start and those that have changed, including the insulin orders,  from the order prior to her hospitalization. Instructed her to call this [place]  if she has any questions after review.  Provided her with the phone # for [facility] as well as this CM cell # [phone].  She stated that she understood. Also instructed her to bring all of her medications with her to her appointment next week.    X  Patient obtained all discharge medications. If not, why? - she said she has not filled any new prescriptions but has medications that she was taking prior to her hospitalization. She has not made any changes from that regime.  She said that she has no insurance and no way to get her medication.  Explained to her how the [facility] works and that she will need to apply for the [employer]  with the financial counselor at the [facility] and this will allow her to obtain her medications from [facility] and charge most of the medications to an account to pay off as she is able.  She can also speak to the Financial counselor about the [employer] and [employer].    Activities of Daily Living:  X  Independent - she said that she has a glucometer and testing strips/ lancets but has not been testing her blood sugar. Instructed her about the importance of testing her blood sugars as ordered and keeping a log for the provider to review.  ? Needs assist (describe; ? home DME used) ? [facility] (describe, ? home DME used)   Community resources in place for patient:  X  None  ? Home Health/Home DME ? Assisted Living ? Support Group               Questions/Concerns discussed:  She requested that her appointment at [facility] on 08/08/17 be changed as she could not make it.  The appointment was re-scheduled for 08/09/17 @ 1410 and she said that would work for her and she would have transportation to the clinic.  She was provided with the clinic address.   No other questions/concerns at this time.  She was very appreciative of the call.

## 2017-08-04 LAB — CULTURE, BLOOD (ROUTINE X 2)
CULTURE: NO GROWTH
Culture: NO GROWTH
SPECIAL REQUESTS: ADEQUATE
SPECIAL REQUESTS: ADEQUATE

## 2017-08-08 ENCOUNTER — Ambulatory Visit: Payer: Self-pay | Admitting: Family Medicine

## 2017-08-09 ENCOUNTER — Ambulatory Visit: Payer: Self-pay | Admitting: Family Medicine

## 2017-08-15 ENCOUNTER — Emergency Department (HOSPITAL_COMMUNITY): Payer: Medicaid Other

## 2017-08-15 ENCOUNTER — Emergency Department (HOSPITAL_COMMUNITY)
Admission: EM | Admit: 2017-08-15 | Discharge: 2017-08-15 | Disposition: A | Discharge status: Home / Self Care | Payer: Medicaid Other | Attending: Emergency Medicine | Admitting: Emergency Medicine

## 2017-08-15 ENCOUNTER — Other Ambulatory Visit: Payer: Self-pay

## 2017-08-15 DIAGNOSIS — E1122 Type 2 diabetes mellitus with diabetic chronic kidney disease: Secondary | ICD-10-CM | POA: Insufficient documentation

## 2017-08-15 DIAGNOSIS — I129 Hypertensive chronic kidney disease with stage 1 through stage 4 chronic kidney disease, or unspecified chronic kidney disease: Secondary | ICD-10-CM | POA: Insufficient documentation

## 2017-08-15 DIAGNOSIS — R1084 Generalized abdominal pain: Secondary | ICD-10-CM | POA: Diagnosis not present

## 2017-08-15 DIAGNOSIS — Z794 Long term (current) use of insulin: Secondary | ICD-10-CM | POA: Diagnosis not present

## 2017-08-15 DIAGNOSIS — E860 Dehydration: Secondary | ICD-10-CM | POA: Diagnosis not present

## 2017-08-15 DIAGNOSIS — K2970 Gastritis, unspecified, without bleeding: Secondary | ICD-10-CM | POA: Diagnosis not present

## 2017-08-15 DIAGNOSIS — R112 Nausea with vomiting, unspecified: Secondary | ICD-10-CM | POA: Insufficient documentation

## 2017-08-15 DIAGNOSIS — R1110 Vomiting, unspecified: Secondary | ICD-10-CM

## 2017-08-15 DIAGNOSIS — Z8673 Personal history of transient ischemic attack (TIA), and cerebral infarction without residual deficits: Secondary | ICD-10-CM | POA: Insufficient documentation

## 2017-08-15 DIAGNOSIS — N182 Chronic kidney disease, stage 2 (mild): Secondary | ICD-10-CM | POA: Insufficient documentation

## 2017-08-15 LAB — BASIC METABOLIC PANEL
ANION GAP: 12 (ref 5–15)
Anion gap: 14 (ref 5–15)
BUN: 15 mg/dL (ref 6–20)
BUN: 11 mg/dL (ref 6–20)
CALCIUM: 9.1 mg/dL (ref 8.9–10.3)
CALCIUM: 8.6 mg/dL — AB (ref 8.9–10.3)
CHLORIDE: 102 mmol/L (ref 101–111)
CHLORIDE: 105 mmol/L (ref 101–111)
CO2: 20 mmol/L — AB (ref 22–32)
CO2: 22 mmol/L (ref 22–32)
CREATININE: 1.33 mg/dL — AB (ref 0.44–1.00)
Creatinine, Ser: 1 mg/dL (ref 0.44–1.00)
GFR calc non Af Amer: 45 mL/min — ABNORMAL LOW (ref >60)
GFR calc non Af Amer: >60 mL/min (ref >60)
GFR, EST AFRICAN AMERICAN: 52 mL/min — AB (ref >60)
GLUCOSE: 334 mg/dL — AB (ref 65–99)
Glucose, Bld: 355 mg/dL — ABNORMAL HIGH (ref 65–99)
POTASSIUM: 4.3 mmol/L (ref 3.5–5.1)
Potassium: 4.1 mmol/L (ref 3.5–5.1)
SODIUM: 136 mmol/L (ref 135–145)
Sodium: 139 mmol/L (ref 135–145)

## 2017-08-15 LAB — I-STAT VENOUS BLOOD GAS, ED
Acid-base deficit: 5 mmol/L — ABNORMAL HIGH (ref 0.0–2.0)
Bicarbonate: 18.6 mmol/L — ABNORMAL LOW (ref 20.0–28.0)
O2 Saturation: 78 %
PCO2 VEN: 30 mmHg — AB (ref 44.0–60.0)
PH VEN: 7.4 (ref 7.250–7.430)
TCO2: 19 mmol/L — AB (ref 22–32)
pO2, Ven: 42 mmHg (ref 32.0–45.0)

## 2017-08-15 LAB — URINALYSIS, ROUTINE W REFLEX MICROSCOPIC
BILIRUBIN URINE: NEGATIVE
Bacteria, UA: NONE SEEN
KETONES UR: 5 mg/dL — AB
LEUKOCYTES UA: NEGATIVE
NITRITE: NEGATIVE
PH: 5 (ref 5.0–8.0)
PROTEIN: 100 mg/dL — AB
Specific Gravity, Urine: 1.02 (ref 1.005–1.030)

## 2017-08-15 LAB — I-STAT TROPONIN, ED: TROPONIN I, POC: 0.01 ng/mL (ref 0.00–0.08)

## 2017-08-15 LAB — CBC
HCT: 34.5 % — ABNORMAL LOW (ref 36.0–46.0)
HEMOGLOBIN: 11 g/dL — AB (ref 12.0–15.0)
MCH: 27.5 pg (ref 26.0–34.0)
MCHC: 31.9 g/dL (ref 30.0–36.0)
MCV: 86.3 fL (ref 78.0–100.0)
PLATELETS: 353 10*3/uL (ref 150–400)
RBC: 4 MIL/uL (ref 3.87–5.11)
RDW: 14.3 % (ref 11.5–15.5)
WBC: 11.8 10*3/uL — AB (ref 4.0–10.5)

## 2017-08-15 LAB — HEPATIC FUNCTION PANEL
ALBUMIN: 3.2 g/dL — AB (ref 3.5–5.0)
ALK PHOS: 83 U/L (ref 38–126)
ALT: 15 U/L (ref 14–54)
AST: 18 U/L (ref 15–41)
BILIRUBIN TOTAL: 1 mg/dL (ref 0.3–1.2)
Bilirubin, Direct: 0.2 mg/dL (ref 0.1–0.5)
Indirect Bilirubin: 0.8 mg/dL (ref 0.3–0.9)
TOTAL PROTEIN: 7 g/dL (ref 6.5–8.1)

## 2017-08-15 LAB — I-STAT CG4 LACTIC ACID, ED
Lactic Acid, Venous: 4.26 mmol/L (ref 0.5–1.9)
Lactic Acid, Venous: 2.22 mmol/L (ref 0.5–1.9)
Lactic Acid, Venous: 1.67 mmol/L (ref 0.5–1.9)

## 2017-08-15 LAB — I-STAT BETA HCG BLOOD, ED (MC, WL, AP ONLY): HCG, QUANTITATIVE: 5.5 m[IU]/mL — AB (ref <5)

## 2017-08-15 LAB — LIPASE, BLOOD: Lipase: 24 U/L (ref 11–51)

## 2017-08-15 MED ORDER — HYDROMORPHONE HCL 2 MG/ML IJ SOLN
1.0000 mg | Freq: Once | INTRAMUSCULAR | Status: AC
  Administered 2017-08-15: 1 mg via INTRAVENOUS
  Filled 2017-08-15: qty 1

## 2017-08-15 MED ORDER — FAMOTIDINE IN NACL 20-0.9 MG/50ML-% IV SOLN
20.0000 mg | Freq: Once | INTRAVENOUS | Status: AC
  Administered 2017-08-15: 20 mg via INTRAVENOUS
  Filled 2017-08-15: qty 50

## 2017-08-15 MED ORDER — SODIUM CHLORIDE 0.9 % IV BOLUS
1000.0000 mL | Freq: Once | INTRAVENOUS | Status: AC
Start: 2017-08-15 — End: 2017-08-15
  Administered 2017-08-15: 1000 mL via INTRAVENOUS

## 2017-08-15 MED ORDER — HALOPERIDOL LACTATE 5 MG/ML IJ SOLN
5.0000 mg | Freq: Once | INTRAMUSCULAR | Status: AC
  Administered 2017-08-15: 5 mg via INTRAVENOUS
  Filled 2017-08-15: qty 1

## 2017-08-15 MED ORDER — SODIUM CHLORIDE 0.9 % IV BOLUS
1000.0000 mL | Freq: Once | INTRAVENOUS | Status: AC
  Administered 2017-08-15: 1000 mL via INTRAVENOUS

## 2017-08-15 MED ORDER — ONDANSETRON HCL 4 MG/2ML IJ SOLN
4.0000 mg | Freq: Once | INTRAMUSCULAR | Status: AC
  Administered 2017-08-15: 4 mg via INTRAVENOUS
  Filled 2017-08-15: qty 2

## 2017-08-15 NOTE — ED Triage Notes (Signed)
Pt in via [facility], per report pt c/o generalized abd pain onset yesterday with n/v, emesis at home unknown how many times, pt now c/o mid non radiating CP today, pt c/o SOB, pt tachypneic, pt A&O x4

## 2017-08-15 NOTE — ED Notes (Signed)
Pt states, "Help me " then yells out. Pt then states she hurts and request pain medication.

## 2017-08-15 NOTE — ED Provider Notes (Signed)
[facility] EMERGENCY DEPARTMENT Provider Note   [name]: [id number] Arrival date & time: 08/15/17  1408   History   Chief Complaint Chief Complaint  Patient presents with  . Abdominal Pain    HPI Deborah [name] is a 52 y.o. female.  H/o DM, gastroparesis, cholecystectomy, recent d/c from hospital for abd pain, intractable N/V who presents with lower abd pain and vomiting since last night. Also with complaint of pressure like chest pain.   The history is provided by the patient and a parent.  Abdominal Pain   Chronicity: patient states pain is new, mother believes patient has had similar pain in the past. The current episode started 12 to 24 hours ago. The problem occurs constantly. The problem has not changed since onset.The pain is associated with an unknown factor. The pain is located in the RLQ, LUQ and suprapubic region. The quality of the pain is sharp. The pain is severe. Associated symptoms include nausea and vomiting. Pertinent negatives include fever, diarrhea, dysuria, hematuria and arthralgias. Nothing aggravates the symptoms. Nothing (attempted reglan and carafate with no relief) relieves the symptoms. Past workup includes CT scan.    Past Medical History:  Diagnosis Date  . Chest pain 12/2015  . Diabetes mellitus   . Fibromyalgia   . Gastric ulcer   . Gastroparesis   . Gout   . Hyperlipidemia   . Hypertension   . Obesity   . Pneumonia   . Stroke [facility]) 02/2011    Patient Active Problem List   Diagnosis Date Noted  . Palliative care encounter   . Back pain 03/19/2017  . Stroke (cerebrum) ([place]) 03/19/2017  . GERD (gastroesophageal reflux disease) 03/19/2017  . Depression 03/19/2017  . Obesity   . Urinary tract infection 08/16/2016  . Anemia 08/16/2016  . Gastroparesis 08/16/2016  . Intractable nausea and vomiting 06/17/2016  . Diabetic gastroparesis ([place]) 06/05/2016  . Gout 06/05/2016  . AKI (acute kidney injury) ([place]) 12/06/2015  .  Hypokalemia 09/26/2015  . Hypomagnesemia 09/26/2015  . Nausea and vomiting 08/20/2015  . Gout flare 05/27/2015  . Abdominal pain 05/26/2015  . DKA (diabetic ketoacidoses) ([place]) 05/25/2015  . Uncontrolled type 2 diabetes mellitus with diabetic neuropathy, with long-term current use of insulin ([place]) 05/25/2015  . Dyslipidemia associated with type 2 diabetes mellitus ([place]) 05/25/2015  . CKD (chronic kidney disease), stage II 05/25/2015  . Essential hypertension, benign 09/28/2013    Past Surgical History:  Procedure Laterality Date  . CATARACT EXTRACTION  01/2014  . CHOLECYSTECTOMY       OB History   None      Home Medications    Prior to Admission medications   Medication Sig Start Date End Date Taking? Authorizing Provider  allopurinol (ZYLOPRIM) 100 MG tablet Take 1 tablet (100 mg total) by mouth daily. 09/23/15  Yes Debbe Odea, MD  ALPRAZolam Duanne Moron) 0.25 MG tablet Take 0.25 mg by mouth 2 (two) times daily.   Yes [provider]  DULoxetine (CYMBALTA) 30 MG capsule Take 1 capsule (30 mg total) by mouth 2 (two) times daily. 09/23/15  Yes Debbe Odea, MD  furosemide (LASIX) 20 MG tablet Take 60 mg by mouth 2 (two) times daily.   Yes [provider]  insulin glargine (LANTUS) 100 UNIT/ML injection Inject 40 Units into the skin at bedtime. 04/01/17  Yes [provider]  insulin lispro (HUMALOG) 100 UNIT/ML injection Inject 10 Units into the skin 3 (three) times daily. 04/01/17  Yes [provider]  methocarbamol (ROBAXIN) 500 MG tablet Take 500 mg by mouth 2 (two) times daily.   Yes [provider]  metoCLOPramide (REGLAN) 10 MG tablet Take 1 tablet (10 mg total) by mouth 3 (three) times daily before meals. Patient taking differently: Take 10 mg by mouth 4 (four) times daily -  before meals and at bedtime.  08/02/17  Yes Rai, Ripudeep K, MD  oxyCODONE (OXY IR/ROXICODONE) 5 MG immediate release tablet Take 5 mg by mouth every 4 (four)  hours as needed for severe pain.   Yes [provider]  pantoprazole (PROTONIX) 40 MG tablet Take 1 tablet (40 mg total) by mouth 2 (two) times daily. Patient taking differently: Take 40 mg by mouth daily.  08/02/17  Yes Rai, Ripudeep K, MD  potassium chloride SA (K-DUR,KLOR-CON) 20 MEQ tablet Take 20 mEq by mouth daily.   Yes [provider]  sucralfate (CARAFATE) 1 g tablet Take 1 tablet (1 g total) by mouth 4 (four) times daily -  with meals and at bedtime. 08/02/17  Yes Rai, Ripudeep K, MD  amLODipine (NORVASC) 10 MG tablet Take 1 tablet (10 mg total) by mouth daily. Patient not taking: Reported on 08/15/2017 08/02/17   Rai, Vernelle Emerald, MD  dicyclomine (BENTYL) 10 MG capsule Take 1 capsule (10 mg total) by mouth 4 (four) times daily. Patient not taking: Reported on 08/15/2017 08/02/17   Rai, Vernelle Emerald, MD  hyoscyamine (LEVSIN SL) 0.125 MG SL tablet Place 2 tablets (0.25 mg total) under the tongue every 6 (six) hours as needed for cramping (abdominal pain.). Patient not taking: Reported on 08/15/2017 08/02/17   Rai, Vernelle Emerald, MD  Magnesium Oxide 400 (240 Mg) MG TABS Take 1 tablet (400 mg total) by mouth daily. Patient not taking: Reported on 08/15/2017 08/02/17   Rai, Vernelle Emerald, MD  metoprolol succinate (TOPROL-XL) 25 MG 24 hr tablet Take 1 tablet (25 mg total) by mouth daily. Patient not taking: Reported on 08/15/2017 08/02/17   Rai, Vernelle Emerald, MD  potassium chloride (K-DUR) 10 MEQ tablet Take 2 tablets (20 mEq total) by mouth daily for 7 days. 08/02/17 08/09/17  [name], MD    Family History Family History  Problem Relation Age of Onset  . Diabetes Mother   . Diabetes Father   . Heart disease Father   . Diabetes Sister   . Congestive Heart Failure Sister 98  . Diabetes Brother     Social History Social History   Tobacco Use  . Smoking status: Never Smoker  . Smokeless tobacco: Never Used  Substance Use Topics  . Alcohol use: No  . Drug use: No      Allergies   Diazepam; Lisinopril; Penicillins; Acetaminophen; Tolmetin; Aspirin; Food; Nsaids; and Tramadol   Review of Systems Review of Systems  Constitutional: Negative for chills and fever.  HENT: Negative for ear pain and sore throat.   Eyes: Negative for pain and visual disturbance.  Respiratory: Positive for shortness of breath. Negative for cough.   Cardiovascular: Positive for chest pain. Negative for palpitations.  Gastrointestinal: Positive for abdominal pain, nausea and vomiting. Negative for diarrhea.  Genitourinary: Negative for dysuria and hematuria.  Musculoskeletal: Negative for arthralgias and back pain.  Skin: Negative for color change and rash.  Neurological: Negative for seizures and syncope.  All other systems reviewed and are negative.    Physical Exam Updated Vital Signs BP (!) 144/75   Pulse (!) 111   Temp 97.7 F (36.5 C) (Oral) Comment: pt  would NOT close mouth   Resp (!) 22   Ht 5\' 6"  (1.676 m)   Wt (!) 138.8 kg (306 lb)   LMP 10/10/2012   SpO2 96%   BMI 49.39 kg/m   Physical Exam  Constitutional: She appears well-developed and well-nourished. No distress.  HENT:  Head: Normocephalic and atraumatic.  Mouth/Throat: Oropharynx is clear and moist.  Eyes: Conjunctivae and EOM are normal.  Neck: Neck supple.  Cardiovascular: Regular rhythm and intact distal pulses. Tachycardia present.  No murmur heard. Pulmonary/Chest: Effort normal and breath sounds normal. No stridor. Tachypnea noted. No respiratory distress. She exhibits no tenderness.  Abdominal: Soft. She exhibits no distension. There is tenderness. There is no rebound and no guarding.  Morbidly obese, diffuse tenderness to palpation though distractable  Musculoskeletal: She exhibits no edema.  Neurological: She is alert.  Skin: Skin is warm and dry.  Psychiatric: Her mood appears anxious.  Nursing note and vitals reviewed.    ED Treatments / Results  Labs (all labs ordered  are listed, but only abnormal results are displayed) Labs Reviewed  BASIC METABOLIC PANEL - Abnormal; Notable for the following components:      Result Value   CO2 20 (*)    Glucose, Bld 355 (*)    Creatinine, Ser 1.33 (*)    GFR calc non Af Amer 45 (*)    GFR calc Af Amer 52 (*)    All other components within normal limits  CBC - Abnormal; Notable for the following components:   WBC 11.8 (*)    Hemoglobin 11.0 (*)    HCT 34.5 (*)    All other components within normal limits  URINALYSIS, ROUTINE W REFLEX MICROSCOPIC - Abnormal; Notable for the following components:   Glucose, UA >=500 (*)    Hgb urine dipstick MODERATE (*)    Ketones, ur 5 (*)    Protein, ur 100 (*)    All other components within normal limits  BASIC METABOLIC PANEL - Abnormal; Notable for the following components:   Glucose, Bld 334 (*)    Calcium 8.6 (*)    All other components within normal limits  HEPATIC FUNCTION PANEL - Abnormal; Notable for the following components:   Albumin 3.2 (*)    All other components within normal limits  I-STAT BETA HCG BLOOD, ED ([facility], [facility], [facility] ONLY) - Abnormal; Notable for the following components:   I-stat hCG, quantitative 5.5 (*)    All other components within normal limits  I-STAT VENOUS BLOOD GAS, ED - Abnormal; Notable for the following components:   pCO2, Ven 30.0 (*)    Bicarbonate 18.6 (*)    TCO2 19 (*)    Acid-base deficit 5.0 (*)    All other components within normal limits  I-STAT CG4 LACTIC ACID, ED - Abnormal; Notable for the following components:   Lactic Acid, Venous 4.26 (*)    All other components within normal limits  I-STAT CG4 LACTIC ACID, ED - Abnormal; Notable for the following components:   Lactic Acid, Venous 2.22 (*)    All other components within normal limits  LIPASE, BLOOD  I-STAT TROPONIN, ED  I-STAT CG4 LACTIC ACID, ED    EKG EKG Interpretation  Date/Time:  Monday August 15 2017 15:50:39 EDT Ventricular Rate:  100 PR Interval:    QRS  Duration: 81 QT Interval:  359 QTC Calculation: 463 R Axis:   -55 Text Interpretation:  Sinus tachycardia Left anterior fascicular block No acute changes Nonspecific ST and T  wave abnormality Confirmed by Varney Biles [phone]) on 08/15/2017 6:24:56 PM   Radiology Dg Abdomen Acute W/chest  Result Date: 08/15/2017 CLINICAL DATA:  Generalized abdominal pain with nausea and vomiting since yesterday. Nonradiating mid chest pain and shortness of breath today. EXAM: DG ABDOMEN ACUTE W/ 1V CHEST COMPARISON:  CT 07/30/2017.  Radiographs 07/29/2017. FINDINGS: Frontal chest radiograph demonstrates lower lung volumes with resulting mild bibasilar atelectasis. The heart size and mediastinal contours are stable. There is no pleural effusion or pneumothorax. There are chromic clavicular degenerative changes with narrowing of the subacromial space the right shoulder, consistent with a rotator cuff tear. The abdomen is nearly gasless on the abdominal radiographs. There is no evidence of free intraperitoneal air or bowel wall thickening. No suspicious abdominal calcifications. Lower lumbar spondylosis noted. IMPRESSION: 1. The abdomen is nearly gasless without obvious distended bowel or free air. 2. Low lung volumes with mild bibasilar atelectasis. Electronically Signed   By: Richardean Sale M.D.   On: 08/15/2017 16:32    Procedures Procedures (including critical care time)  Medications Ordered in ED Medications  sodium chloride 0.9 % bolus 1,000 mL (0 mLs Intravenous Stopped 08/15/17 1710)  ondansetron (ZOFRAN) injection 4 mg (4 mg Intravenous Given 08/15/17 1446)  haloperidol lactate (HALDOL) injection 5 mg (5 mg Intravenous Given 08/15/17 1556)  HYDROmorphone (DILAUDID) injection 1 mg (1 mg Intravenous Given 08/15/17 1758)  sodium chloride 0.9 % bolus 1,000 mL (0 mLs Intravenous Stopped 08/15/17 1954)  famotidine (PEPCID) IVPB 20 mg premix (0 mg Intravenous Stopped 08/15/17 1954)     Initial Impression /  Assessment and Plan / ED Course  I have reviewed the triage vital signs and the nursing notes.  Pertinent labs & imaging results that were available during my care of the patient were reviewed by me and considered in my medical decision making (see chart for details).     Deborah [name] is a 52 y.o. female with PMHx of DM, gastroparesis, cholecystectomy, recent d/c from hospital for abd pain, intractable N/V who presents with lower abd pain and vomiting since last night, also with chest pain. Reviewed and confirmed nursing documentation for past medical history, family history, social history. VS afebrile, HR 114, SpO2 100% on RA. Exam remarkable for diffuse abd tenderness, though distractable. Nl cardiopulmonary exam other than tachycardia. Ddx includes gastroparesis, DKA, UTI. Unlikely acute surgical process given very recent admission for similar and unrevealing imaging. In regards to chest pain, unclear if pain is continuous from abd pain or separate- pt with variable answers. Recent CTA PE study negative. Will obtain delta trops to r/o ACS though unlikely. Possible gastritis, pancreatitis.   Lipase wnl. UA with >500 glucose, otherwise wnl. BMP with hyperglycemia 355, Cr 1.33, nl AG. CBC with mild leukocytosis 11.8 (appears chronic), normocytic anemia with hgb 11 (baseline). Acute abd series wnl.  Trop negative. EKG unremarkable.   Lactic acid initially 4.26, improved to 2.22, then 1.67 after 2L NS. BMP improved with nl Cr. Given IV haldol, pepcid, dilaudid with improvement in symptoms pain and resolution of vomiting. Chest pain resolved. On multiple re-evals, pt remained with benign abd, no peritoneal signs. Improved tachycardia with fluids, though did not fully resolve- on chart review, pt persistently tachycardic into 110s. Clinically improved, believe pt okay for discharge home with continued symptomatic treatment at home.   Old records reviewed. Labs reviewed by me and used in the medical  decision making.  Imaging viewed and interpreted by me and used in the medical decision making (  formal interpretation from radiologist). EKG reviewed by me and used in the medical decision making. D/c home in stable condition, return precautions discussed. Patient agreeable with plan for d/c home.   Final Clinical Impressions(s) / ED Diagnoses   Final diagnoses:  Non-intractable vomiting, presence of nausea not specified, unspecified vomiting type  Generalized abdominal pain  Dehydration  Gastritis without bleeding, unspecified chronicity, unspecified gastritis type    ED Discharge Orders    None       Norm Salt, MD 08/15/17 1245    Varney Biles, MD 08/17/17 [phone]

## 2017-08-15 NOTE — ED Notes (Signed)
Pt continuously yelling. Pt has removed gown, pulse O2 and electrodes. Pt stated that she is hot and that her chest is hurting. Another EKG was taken and given to Dr. Berline Lopes (working with Dr. Kathrynn Humble).

## 2017-08-15 NOTE — ED Notes (Addendum)
Pt insist she must go to bedside commode. Pt up to bsc with minimal assist but then voids and stool on floor after bedpan falls.Pt vomits to emnesis bag while upright.

## 2017-08-17 ENCOUNTER — Emergency Department (HOSPITAL_COMMUNITY): Payer: Medicaid Other

## 2017-08-17 ENCOUNTER — Encounter (HOSPITAL_COMMUNITY): Payer: Self-pay | Admitting: Emergency Medicine

## 2017-08-17 ENCOUNTER — Emergency Department (HOSPITAL_COMMUNITY)
Admission: EM | Admit: 2017-08-17 | Discharge: 2017-08-17 | Disposition: A | Discharge status: Home / Self Care | Payer: Medicaid Other | Attending: Emergency Medicine | Admitting: Emergency Medicine

## 2017-08-17 DIAGNOSIS — E1143 Type 2 diabetes mellitus with diabetic autonomic (poly)neuropathy: Secondary | ICD-10-CM | POA: Insufficient documentation

## 2017-08-17 DIAGNOSIS — Z79899 Other long term (current) drug therapy: Secondary | ICD-10-CM | POA: Diagnosis not present

## 2017-08-17 DIAGNOSIS — E1110 Type 2 diabetes mellitus with ketoacidosis without coma: Secondary | ICD-10-CM | POA: Insufficient documentation

## 2017-08-17 DIAGNOSIS — Z794 Long term (current) use of insulin: Secondary | ICD-10-CM | POA: Insufficient documentation

## 2017-08-17 DIAGNOSIS — I129 Hypertensive chronic kidney disease with stage 1 through stage 4 chronic kidney disease, or unspecified chronic kidney disease: Secondary | ICD-10-CM | POA: Insufficient documentation

## 2017-08-17 DIAGNOSIS — R0789 Other chest pain: Secondary | ICD-10-CM | POA: Diagnosis present

## 2017-08-17 DIAGNOSIS — E1165 Type 2 diabetes mellitus with hyperglycemia: Secondary | ICD-10-CM | POA: Diagnosis not present

## 2017-08-17 DIAGNOSIS — R739 Hyperglycemia, unspecified: Secondary | ICD-10-CM

## 2017-08-17 DIAGNOSIS — K3184 Gastroparesis: Secondary | ICD-10-CM

## 2017-08-17 DIAGNOSIS — E1122 Type 2 diabetes mellitus with diabetic chronic kidney disease: Secondary | ICD-10-CM | POA: Insufficient documentation

## 2017-08-17 DIAGNOSIS — N182 Chronic kidney disease, stage 2 (mild): Secondary | ICD-10-CM | POA: Insufficient documentation

## 2017-08-17 LAB — BASIC METABOLIC PANEL
Anion gap: 15 (ref 5–15)
BUN: 16 mg/dL (ref 6–20)
CALCIUM: 9 mg/dL (ref 8.9–10.3)
CO2: 23 mmol/L (ref 22–32)
Chloride: 98 mmol/L — ABNORMAL LOW (ref 101–111)
Creatinine, Ser: 1.47 mg/dL — ABNORMAL HIGH (ref 0.44–1.00)
GFR calc Af Amer: 46 mL/min — ABNORMAL LOW (ref >60)
GFR, EST NON AFRICAN AMERICAN: 40 mL/min — AB (ref >60)
GLUCOSE: 401 mg/dL — AB (ref 65–99)
Potassium: 3.3 mmol/L — ABNORMAL LOW (ref 3.5–5.1)
Sodium: 136 mmol/L (ref 135–145)

## 2017-08-17 LAB — CBC
HCT: 35.5 % — ABNORMAL LOW (ref 36.0–46.0)
Hemoglobin: 11.7 g/dL — ABNORMAL LOW (ref 12.0–15.0)
MCH: 28.9 pg (ref 26.0–34.0)
MCHC: 33 g/dL (ref 30.0–36.0)
MCV: 87.7 fL (ref 78.0–100.0)
Platelets: 454 10*3/uL — ABNORMAL HIGH (ref 150–400)
RBC: 4.05 MIL/uL (ref 3.87–5.11)
RDW: 14.8 % (ref 11.5–15.5)
WBC: 15.7 10*3/uL — ABNORMAL HIGH (ref 4.0–10.5)

## 2017-08-17 LAB — CBG MONITORING, ED: GLUCOSE-CAPILLARY: 326 mg/dL — AB (ref 65–99)

## 2017-08-17 LAB — I-STAT TROPONIN, ED: TROPONIN I, POC: 0.01 ng/mL (ref 0.00–0.08)

## 2017-08-17 LAB — I-STAT BETA HCG BLOOD, ED (MC, WL, AP ONLY): I-stat hCG, quantitative: <5 m[IU]/mL (ref <5)

## 2017-08-17 MED ORDER — SODIUM CHLORIDE 0.9 % IV SOLN
INTRAVENOUS | Status: DC
  Administered 2017-08-17: 20:00:00 via INTRAVENOUS

## 2017-08-17 MED ORDER — INSULIN ASPART 100 UNIT/ML ~~LOC~~ SOLN
10.0000 [IU] | Freq: Once | SUBCUTANEOUS | Status: AC
  Administered 2017-08-17: 10 [IU] via INTRAVENOUS
  Filled 2017-08-17: qty 1

## 2017-08-17 MED ORDER — HALOPERIDOL LACTATE 5 MG/ML IJ SOLN
5.0000 mg | Freq: Once | INTRAMUSCULAR | Status: AC
  Administered 2017-08-17: 5 mg via INTRAVENOUS
  Filled 2017-08-17: qty 1

## 2017-08-17 MED ORDER — FAMOTIDINE IN NACL 20-0.9 MG/50ML-% IV SOLN
20.0000 mg | Freq: Once | INTRAVENOUS | Status: AC
  Administered 2017-08-17: 20 mg via INTRAVENOUS
  Filled 2017-08-17: qty 50

## 2017-08-17 MED ORDER — HYDROMORPHONE HCL 1 MG/ML IJ SOLN
1.0000 mg | Freq: Once | INTRAMUSCULAR | Status: AC
  Administered 2017-08-17: 1 mg via INTRAVENOUS
  Filled 2017-08-17: qty 1

## 2017-08-17 MED ORDER — ONDANSETRON HCL 4 MG/2ML IJ SOLN
4.0000 mg | Freq: Once | INTRAMUSCULAR | Status: AC
  Administered 2017-08-17: 4 mg via INTRAVENOUS
  Filled 2017-08-17: qty 2

## 2017-08-17 MED ORDER — SODIUM CHLORIDE 0.9 % IV BOLUS
1000.0000 mL | Freq: Once | INTRAVENOUS | Status: AC
  Administered 2017-08-17: 1000 mL via INTRAVENOUS

## 2017-08-17 NOTE — ED Notes (Signed)
ED Provider at bedside. 

## 2017-08-17 NOTE — ED Provider Notes (Signed)
[facility] DEPT Provider Note   [name]: [id number] Arrival date & time: 08/17/17  [place]     History   Chief Complaint Chief Complaint  Patient presents with  . Chest Pain  . Arm Pain  . Abdominal Pain  . Emesis  . [name]    HPI Deborah [name] is a 52 y.o. female.  Patient presenting here today with complaint of chest pain abdominal pain.  Patient's also had nausea and vomiting several times today.  Patient was admitted May 24 at [facility] for similar problem also seen June 10 for similar problem.  Patient review of those encounters it was felt that it was related to her gastroparesis.  During her last visit on June 10 she responded well to Haldol Dilaudid Pepcid and Zofran.  Patient when she was discharged the end of May had follow-up at the wellness clinic on June 3 but she was not aware that she had this appointment so therefore she missed it.  Patient does have a history of diabetes.  She is on insulin.  She does have Reglan and insulin at home.  The chest pain is substernal the abdominal pain is generalized.     Past Medical History:  Diagnosis Date  . Chest pain 12/2015  . Diabetes mellitus   . Fibromyalgia   . Gastric ulcer   . Gastroparesis   . Gout   . Hyperlipidemia   . Hypertension   . Obesity   . Pneumonia   . Stroke [facility]) 02/2011    Patient Active Problem List   Diagnosis Date Noted  . Palliative care encounter   . Back pain 03/19/2017  . Stroke (cerebrum) ([place]) 03/19/2017  . GERD (gastroesophageal reflux disease) 03/19/2017  . Depression 03/19/2017  . Obesity   . Urinary tract infection 08/16/2016  . Anemia 08/16/2016  . Gastroparesis 08/16/2016  . Intractable nausea and vomiting 06/17/2016  . Diabetic gastroparesis ([place]) 06/05/2016  . Gout 06/05/2016  . AKI (acute kidney injury) ([place]) 12/06/2015  . Hypokalemia 09/26/2015  . Hypomagnesemia 09/26/2015  . Nausea and vomiting 08/20/2015  . Gout flare  05/27/2015  . Abdominal pain 05/26/2015  . DKA (diabetic ketoacidoses) ([place]) 05/25/2015  . Uncontrolled type 2 diabetes mellitus with diabetic neuropathy, with long-term current use of insulin ([place]) 05/25/2015  . Dyslipidemia associated with type 2 diabetes mellitus ([place]) 05/25/2015  . CKD (chronic kidney disease), stage II 05/25/2015  . Essential hypertension, benign 09/28/2013    Past Surgical History:  Procedure Laterality Date  . CATARACT EXTRACTION  01/2014  . CHOLECYSTECTOMY       OB History   None      Home Medications    Prior to Admission medications   Medication Sig Start Date End Date Taking? Authorizing Provider  allopurinol (ZYLOPRIM) 100 MG tablet Take 1 tablet (100 mg total) by mouth daily. 09/23/15  Yes Debbe Odea, MD  ALPRAZolam Duanne Moron) 0.25 MG tablet Take 0.25 mg by mouth 2 (two) times daily.   Yes [provider]  DULoxetine (CYMBALTA) 30 MG capsule Take 1 capsule (30 mg total) by mouth 2 (two) times daily. 09/23/15  Yes Debbe Odea, MD  furosemide (LASIX) 20 MG tablet Take 60 mg by mouth 2 (two) times daily.   Yes [provider]  insulin glargine (LANTUS) 100 UNIT/ML injection Inject 40 Units into the skin at bedtime. 04/01/17  Yes [provider]  insulin lispro (HUMALOG) 100 UNIT/ML injection Inject 10 Units into the skin 3 (three)  times daily. 04/01/17  Yes [provider]  methocarbamol (ROBAXIN) 500 MG tablet Take 500 mg by mouth 2 (two) times daily.   Yes [provider]  metoCLOPramide (REGLAN) 10 MG tablet Take 1 tablet (10 mg total) by mouth 3 (three) times daily before meals. Patient taking differently: Take 10 mg by mouth 4 (four) times daily -  before meals and at bedtime.  08/02/17  Yes Rai, Ripudeep K, MD  oxyCODONE (OXY IR/ROXICODONE) 5 MG immediate release tablet Take 5 mg by mouth every 4 (four) hours as needed for severe pain.   Yes [provider]  pantoprazole (PROTONIX) 40 MG tablet Take  1 tablet (40 mg total) by mouth 2 (two) times daily. Patient taking differently: Take 40 mg by mouth daily.  08/02/17  Yes Rai, Ripudeep K, MD  potassium chloride SA (K-DUR,KLOR-CON) 20 MEQ tablet Take 20 mEq by mouth daily.   Yes [provider]  sucralfate (CARAFATE) 1 g tablet Take 1 tablet (1 g total) by mouth 4 (four) times daily -  with meals and at bedtime. 08/02/17  Yes Rai, Ripudeep K, MD  amLODipine (NORVASC) 10 MG tablet Take 1 tablet (10 mg total) by mouth daily. Patient not taking: Reported on 08/15/2017 08/02/17   Rai, Vernelle Emerald, MD  dicyclomine (BENTYL) 10 MG capsule Take 1 capsule (10 mg total) by mouth 4 (four) times daily. Patient not taking: Reported on 08/15/2017 08/02/17   Rai, Vernelle Emerald, MD  hyoscyamine (LEVSIN SL) 0.125 MG SL tablet Place 2 tablets (0.25 mg total) under the tongue every 6 (six) hours as needed for cramping (abdominal pain.). Patient not taking: Reported on 08/15/2017 08/02/17   Rai, Vernelle Emerald, MD  Magnesium Oxide 400 (240 Mg) MG TABS Take 1 tablet (400 mg total) by mouth daily. Patient not taking: Reported on 08/15/2017 08/02/17   Rai, Vernelle Emerald, MD  metoprolol succinate (TOPROL-XL) 25 MG 24 hr tablet Take 1 tablet (25 mg total) by mouth daily. Patient not taking: Reported on 08/15/2017 08/02/17   Rai, Vernelle Emerald, MD  potassium chloride (K-DUR) 10 MEQ tablet Take 2 tablets (20 mEq total) by mouth daily for 7 days. 08/02/17 08/09/17  [name], MD    Family History Family History  Problem Relation Age of Onset  . Diabetes Mother   . Diabetes Father   . Heart disease Father   . Diabetes Sister   . Congestive Heart Failure Sister 69  . Diabetes Brother     Social History Social History   Tobacco Use  . Smoking status: Never Smoker  . Smokeless tobacco: Never Used  Substance Use Topics  . Alcohol use: No  . Drug use: No     Allergies   Diazepam; Lisinopril; Penicillins; Acetaminophen; Tolmetin; Aspirin; Food; Nsaids; and  Tramadol   Review of Systems Review of Systems  Constitutional: Negative for fever.  HENT: Negative for congestion.   Eyes: Negative for visual disturbance.  Respiratory: Negative for shortness of breath.   Cardiovascular: Positive for chest pain.  Gastrointestinal: Positive for abdominal pain, nausea and vomiting.  Genitourinary: Negative for dysuria.  Musculoskeletal: Negative for back pain.  Skin: Negative for rash.  Neurological: Negative for headaches.  Hematological: Does not bruise/bleed easily.  Psychiatric/Behavioral: Negative for confusion.     Physical Exam Updated Vital Signs BP 112/76 (BP Location: Right Arm)   Pulse (!) 108   Temp 97.9 F (36.6 C) (Oral)   Resp 20   Ht 1.676 m (5\' 6" )  Wt (!) 140.6 kg (310 lb)   LMP 10/10/2012   SpO2 95%   BMI 50.04 kg/m   Physical Exam  Constitutional: She is oriented to person, place, and time. She appears well-developed and well-nourished. No distress.  HENT:  Head: Normocephalic and atraumatic.  Mucous membranes dry  Eyes: Pupils are equal, round, and reactive to light. Conjunctivae and EOM are normal.  Neck: Normal range of motion. Neck supple.  Cardiovascular: Regular rhythm and normal heart sounds.  Tachycardia  Pulmonary/Chest: Effort normal and breath sounds normal.  Abdominal: Soft. Bowel sounds are normal. There is tenderness. There is no guarding.  Generalized tenderness no guarding  Musculoskeletal: Normal range of motion. She exhibits no edema.  Neurological: She is alert and oriented to person, place, and time. No cranial nerve deficit or sensory deficit. She exhibits normal muscle tone. Coordination normal.  Skin: Skin is warm. Capillary refill takes less than 2 seconds.  Nursing note and vitals reviewed.    ED Treatments / Results  Labs (all labs ordered are listed, but only abnormal results are displayed) Labs Reviewed  BASIC METABOLIC PANEL - Abnormal; Notable for the following components:       Result Value   Potassium 3.3 (*)    Chloride 98 (*)    Glucose, Bld 401 (*)    Creatinine, Ser 1.47 (*)    GFR calc non Af Amer 40 (*)    GFR calc Af Amer 46 (*)    All other components within normal limits  CBC - Abnormal; Notable for the following components:   WBC 15.7 (*)    Hemoglobin 11.7 (*)    HCT 35.5 (*)    Platelets 454 (*)    All other components within normal limits  CBG MONITORING, ED - Abnormal; Notable for the following components:   Glucose-Capillary 326 (*)    All other components within normal limits  I-STAT TROPONIN, ED  I-STAT BETA HCG BLOOD, ED ([facility], [facility], [facility] ONLY)  CBG MONITORING, ED    EKG EKG Interpretation  Date/Time:  Wednesday August 17 2017 18:47:29 EDT Ventricular Rate:  129 PR Interval:    QRS Duration: 76 QT Interval:  326 QTC Calculation: 478 R Axis:   -23 Text Interpretation:  Sinus tachycardia Borderline left axis deviation Confirmed by Fredia Sorrow [phone]) on 08/17/2017 7:34:32 PM   Radiology Dg Abd Acute W/chest  Result Date: 08/17/2017 CLINICAL DATA:  LEFT chest pain radiating to LEFT arm today. Abdominal pain for few days. History of diabetes and cholecystectomy. EXAM: DG ABDOMEN ACUTE W/ 1V CHEST COMPARISON:  Chest radiograph March 19, 2017 FINDINGS: Habitus limited examination. Cardiomediastinal silhouette is normal. Lungs are clear, no pleural effusions. No pneumothorax. Soft tissue planes and included osseous structures are unremarkable. Paucity of bowel gas. Surgical clips in the included right abdomen compatible with cholecystectomy. No intra-abdominal mass effect, pathologic calcifications or free air. Soft tissue planes and included osseous structures are non-suspicious. IMPRESSION: Negative chest radiograph. Non-specific bowel gas pattern. Electronically Signed   By: Elon Alas M.D.   On: 08/17/2017 20:21    Procedures Procedures (including critical care time)  Medications Ordered in ED Medications  0.9 %  sodium  chloride infusion ( Intravenous New Bag/Given 08/17/17 2018)  sodium chloride 0.9 % bolus 1,000 mL (1,000 mLs Intravenous New Bag/Given 08/17/17 2017)  ondansetron (ZOFRAN) injection 4 mg (4 mg Intravenous Given 08/17/17 2017)  HYDROmorphone (DILAUDID) injection 1 mg (1 mg Intravenous Given 08/17/17 2016)  haloperidol lactate (HALDOL) injection 5 mg (  5 mg Intravenous Given 08/17/17 2017)  famotidine (PEPCID) IVPB 20 mg premix (0 mg Intravenous Stopped 08/17/17 2047)  insulin aspart (novoLOG) injection 10 Units (10 Units Intravenous Given 08/17/17 2125)     Initial Impression / Assessment and Plan / ED Course  I have reviewed the triage vital signs and the nursing notes.  Pertinent labs & imaging results that were available during my care of the patient were reviewed by me and considered in my medical decision making (see chart for details).    Work-up here suggestive of reason for the end of May's admission and the recent evaluation on June 10.  Seems to be related to gastroparesis.  Hyperglycemia here improved some with IV insulin.  Otherwise labs without significant abnormalities.  Acute abdominal series without any acute changes.  Patient had in May and negative CT of the abdomen.  Patient improved here with Haldol hydromorphone and Pepcid and Zofran.  Symptoms resolved.  Patient missed an appointment that she had scheduled following her recent admission the end of May to follow-up with the wellness clinic on June 3.  It appears the patient was not aware she had this appointment.  Patient given information to reschedule that.  Patient troponin negative EKG without acute changes.  Other than tachycardic.  Patient still with some tachycardia but improved.  Closer to the low 100s.  Patient much more comfortable.  No evidence of any acute abdominal process.  Patient has Reglan at home.  Patient was unable to get the Protonix filled.  Patient has insulin at home.   Final Clinical Impressions(s) / ED  Diagnoses   Final diagnoses:  Gastroparesis  Hyperglycemia    ED Discharge Orders    None       Fredia Sorrow, MD 08/17/17 2256

## 2017-08-17 NOTE — ED Triage Notes (Signed)
Pt c/o left chest pain that radiates to left arm, with n/v that started today and SOB. Chest pais and abd pains have been ongoing of couple days. Reports that she was seen at other hospital for same symptoms couple days ago.

## 2017-08-17 NOTE — Discharge Instructions (Addendum)
Work-up for the chest pain abdominal pain seems to be secondary to gastroparesis.  He did have an elevated blood sugar here tonight improved some with some IV insulin followed carefully at home.  Reschedule your appointment at the wellness clinic that was scheduled following her discharge the end of May to be June 3.

## 2017-08-23 IMAGING — CR DG CHEST 2V
2 series · 2 of 2 positions shown · non-contrast
Comparison: 05/25/2015

CLINICAL DATA: [name] and chest pain

EXAM:
CHEST  2 VIEW

[w chest lat]
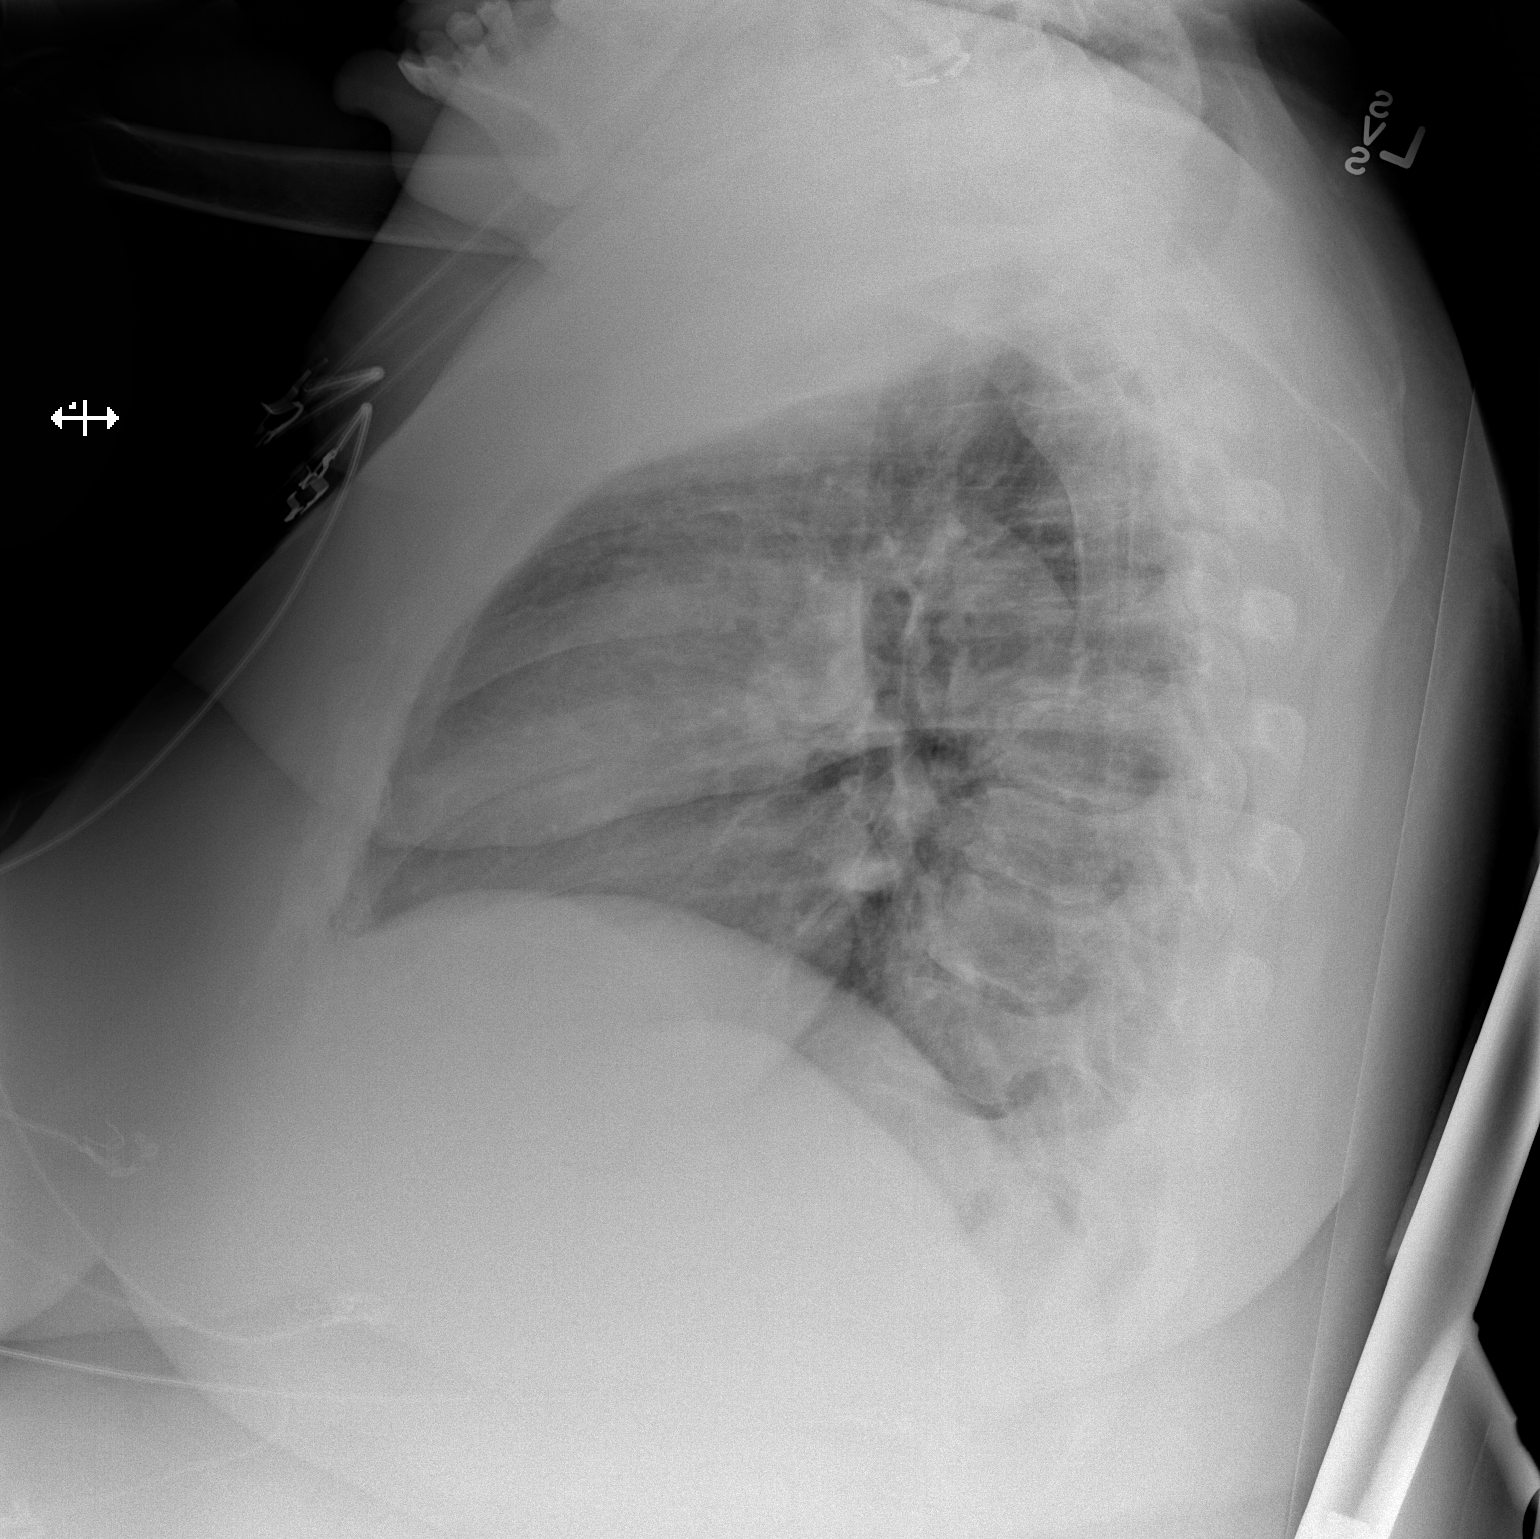

[x chest ap]
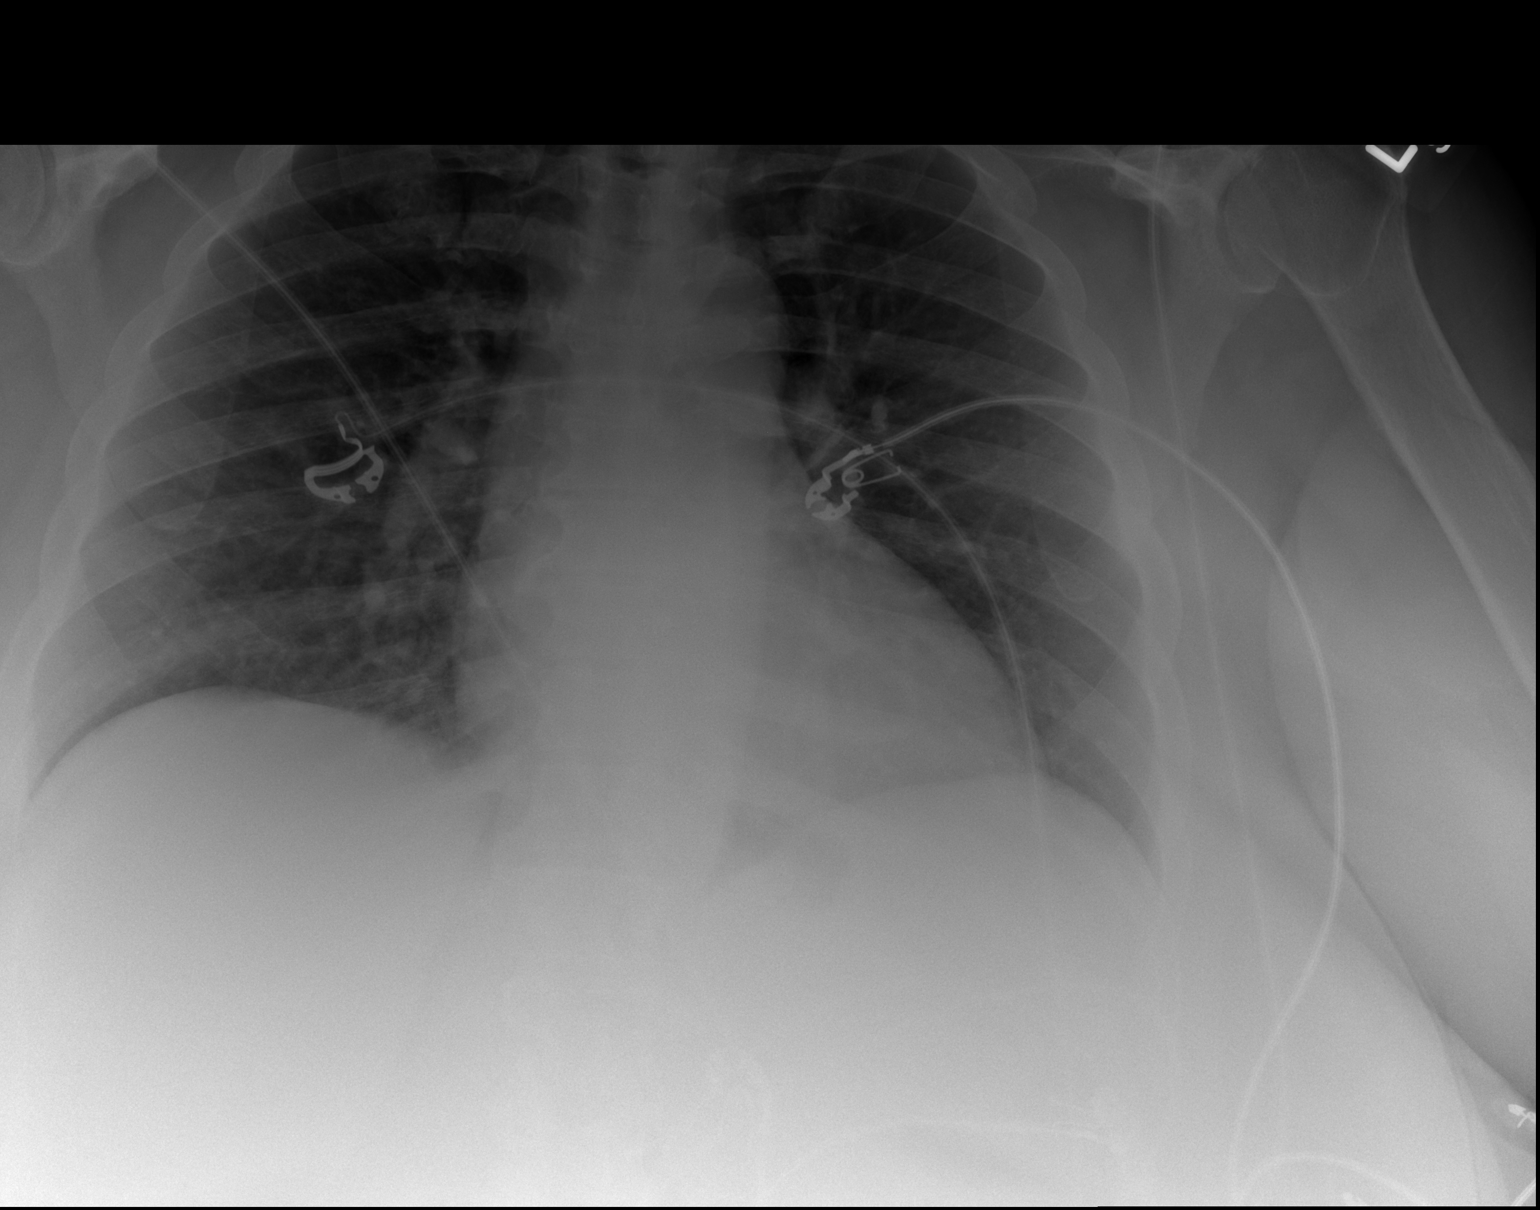

[2 of 2 positions shown; findings below may reference images not displayed]

FINDINGS: The heart size and mediastinal contours are within normal limits.
Both lungs are clear. The visualized skeletal structures are
unremarkable.
IMPRESSION: No active cardiopulmonary disease.

## 2017-09-16 IMAGING — CT CT ABD-PELV W/O CM
2 of 4 series · 17 of 46 positions shown, 19 images · non-contrast
Comparison: 05/26/2015

CLINICAL DATA: Upper abdominal pain after cholecystectomy performed
08/07/2015, elevated white blood count

EXAM:
CT ABDOMEN AND PELVIS WITHOUT CONTRAST
TECHNIQUE: Multidetector CT imaging of the abdomen and pelvis was performed
following the standard protocol without IV contrast.

[Series 2: abd/pel w/o · axial · non-contrast · 0.79mm/px · z∈[+1382,+1782]mm · 14 of 89 slices shown, 16 images]
[im 5/89  soft-tissue]
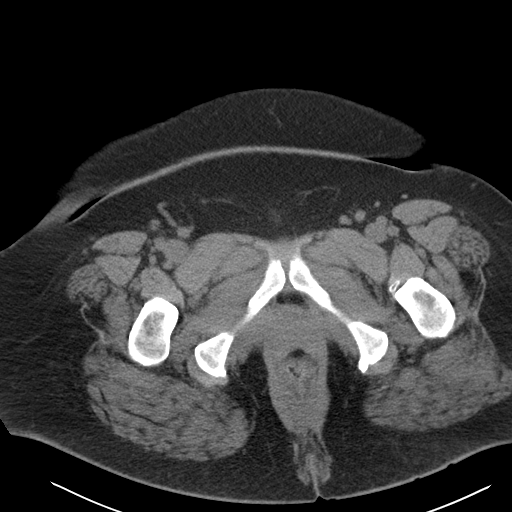
[im 5/89  bone]
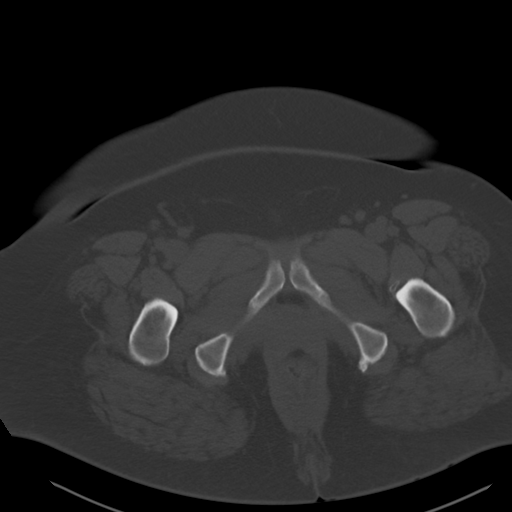
[im 13/89  soft-tissue]
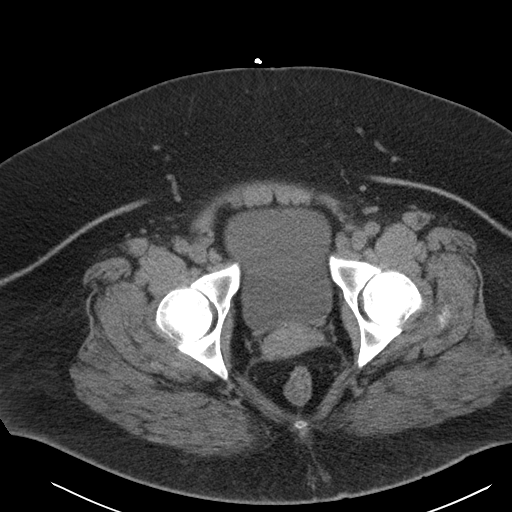
[im 17/89  soft-tissue]
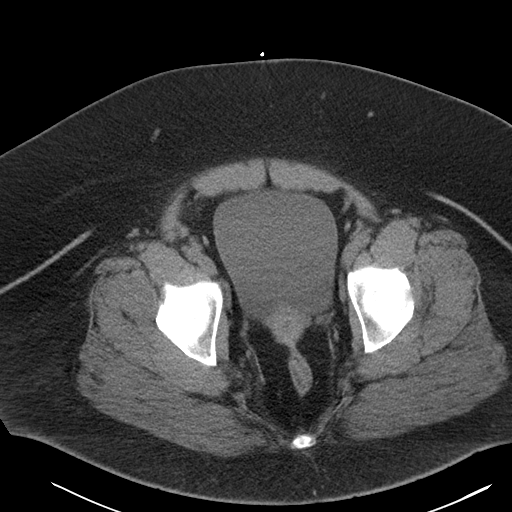
[im 25/89  soft-tissue]
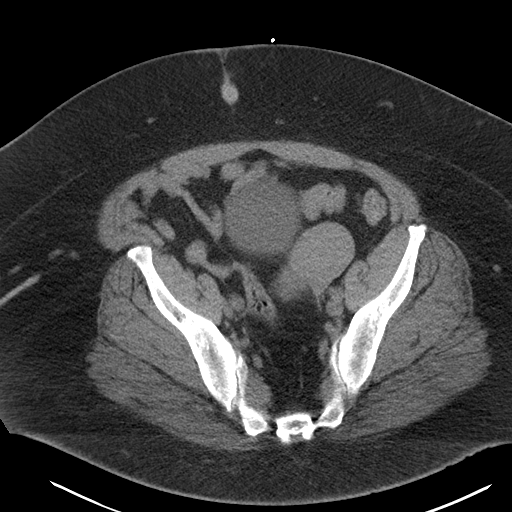
[im 29/89  soft-tissue]
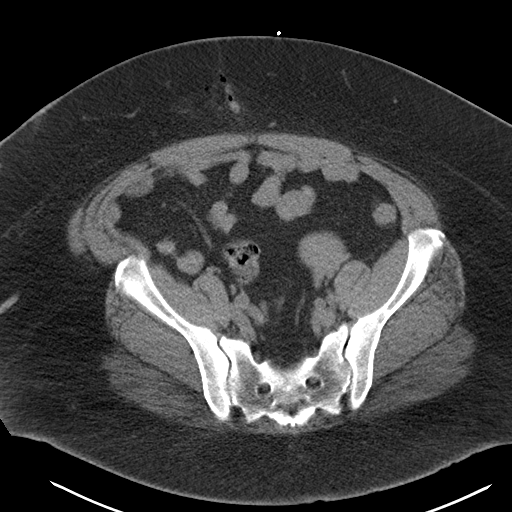
[im 37/89  soft-tissue]
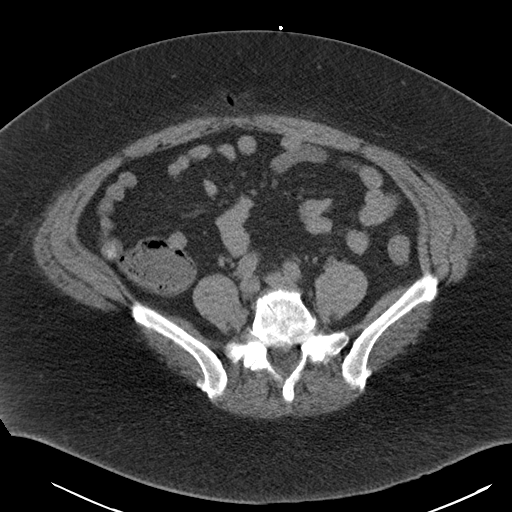
[im 41/89  soft-tissue]
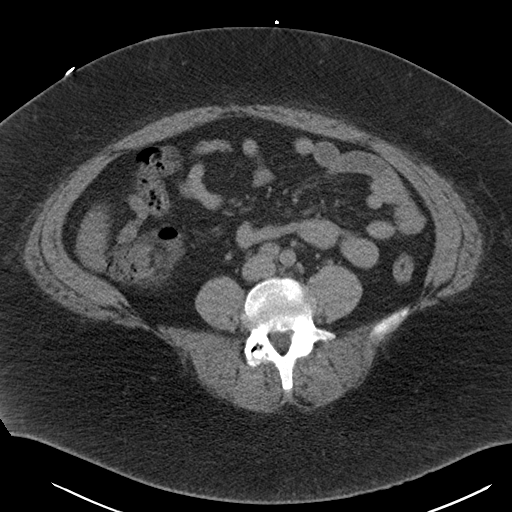
[im 49/89  soft-tissue]
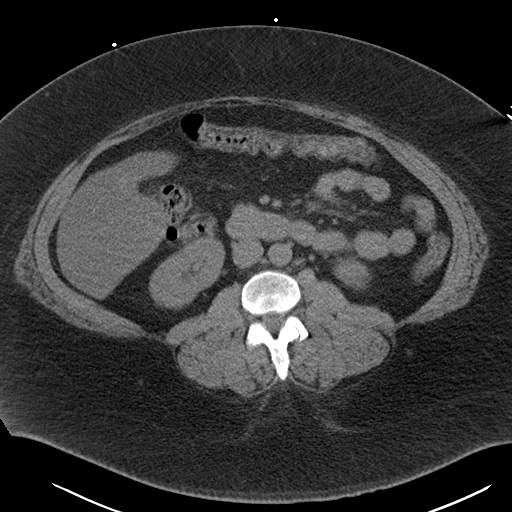
[im 53/89  soft-tissue]
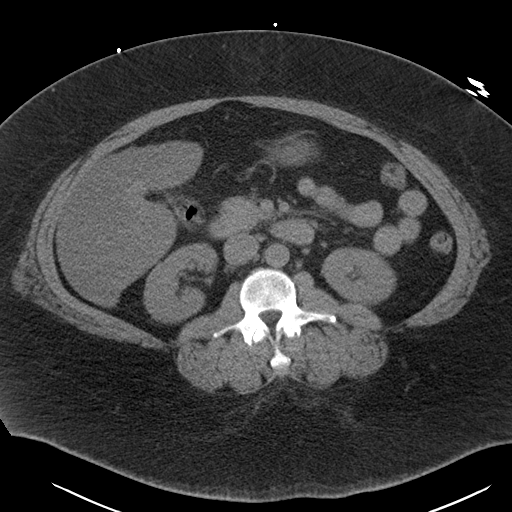
[im 53/89  bone]
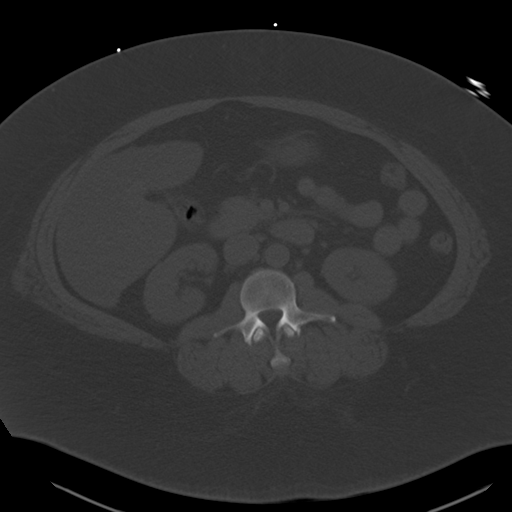
[im 61/89  soft-tissue]
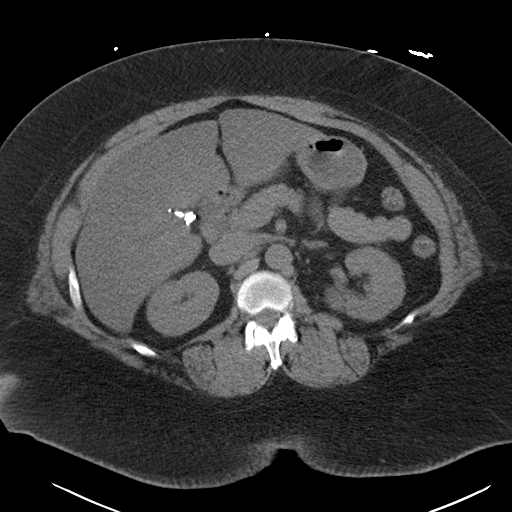
[im 65/89  soft-tissue]
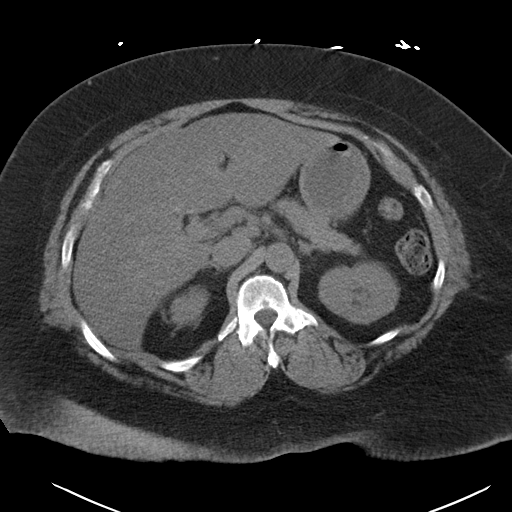
[im 73/89  soft-tissue]
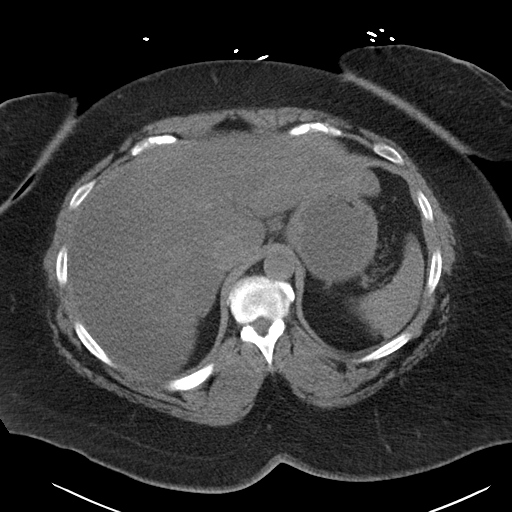
[im 77/89  soft-tissue]
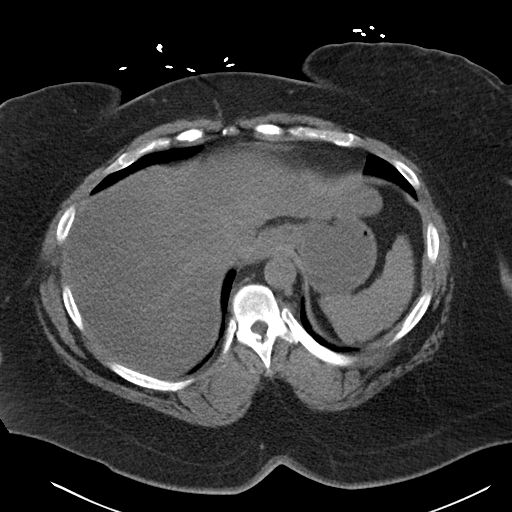
[im 85/89  soft-tissue]
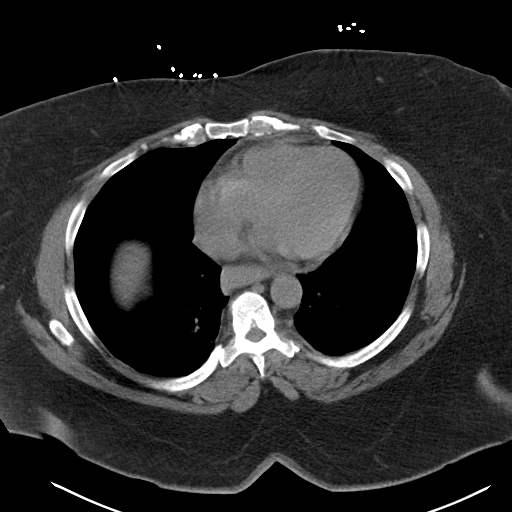

[Series 4: coronal · coronal · 0.74mm/px · 3 of 176 slices shown]
[im 59/176  soft-tissue]
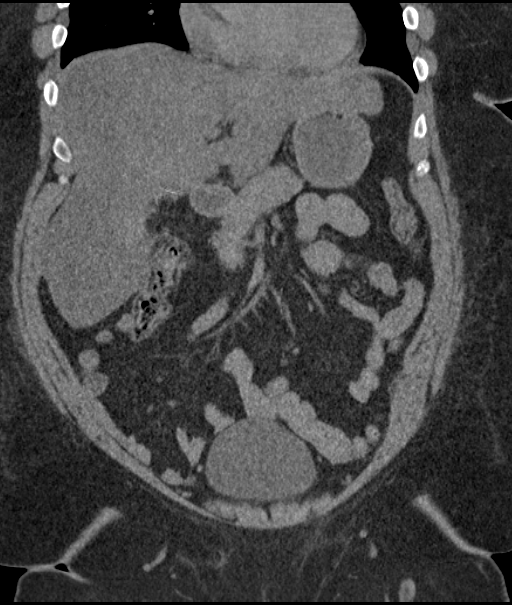
[im 78/176  soft-tissue]
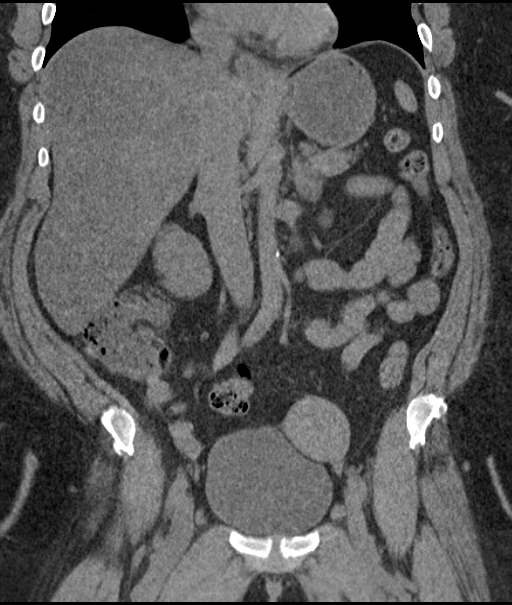
[im 98/176  soft-tissue]
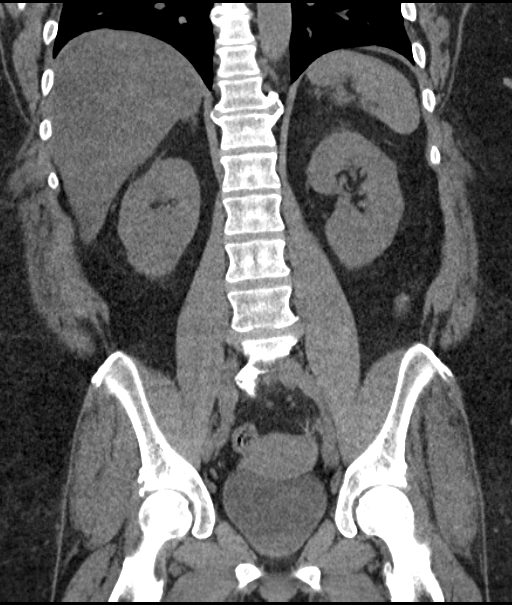

[17 of 46 positions shown; findings below may reference images not displayed]

FINDINGS: Lower chest:  Mild bilateral lower lobe atelectasis

Hepatobiliary: Status post cholecystectomy. Minimal postoperative
change in the gallbladder fossa. No fluid collection in the region.
Liver appears normal in its non contrasted state.

Pancreas: Negative

Spleen: Negative

Adrenals/Urinary Tract: Negative

Stomach/Bowel: Small bowel stomach and large bowel are normal.

Vascular/Lymphatic: No significant abnormalities

Reproductive: 5 cm I so attenuating mass arising off the left
uterine fundus likely a fibroid. It is unchanged.

Other: No free fluid in the abdomen or pelvis. Anticipated tract of
air in the periumbilical region across the abdominal wall from
recent laparoscopy with air down to the abdominal wall musculature.
These constitute anticipated postoperative findings.

Musculoskeletal: No acute findings
IMPRESSION: Anticipated postoperative appearance with no acute abnormalities

## 2017-09-29 IMAGING — CT CT ABD-PELV W/ CM
2 of 5 series · 17 of 46 positions shown, 19 images · IV contrast (iopamidol)
Comparison: 08/08/2015

CLINICAL DATA: Abdominal pain, history of recent cholecystectomy

EXAM:
CT ABDOMEN AND PELVIS WITH CONTRAST
TECHNIQUE: Multidetector CT imaging of the abdomen and pelvis was performed
using the standard protocol following bolus administration of
intravenous contrast.
CONTRAST:  100mL EY0H1I-4MM IOPAMIDOL (EY0H1I-4MM) INJECTION 61%

[Series 2: rtn a/p with · axial · 0.93mm/px · z∈[-464,-60]mm · 14 of 93 slices shown, 16 images]
[im 6/93  soft-tissue]
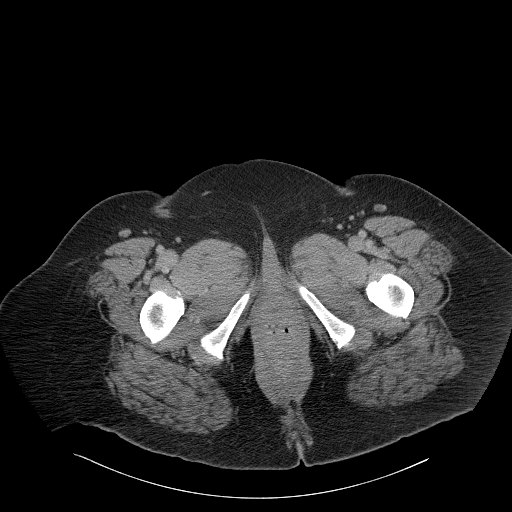
[im 6/93  bone]
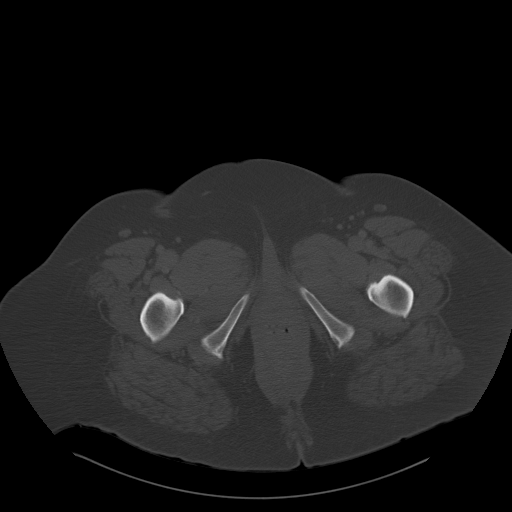
[im 12/93  soft-tissue]
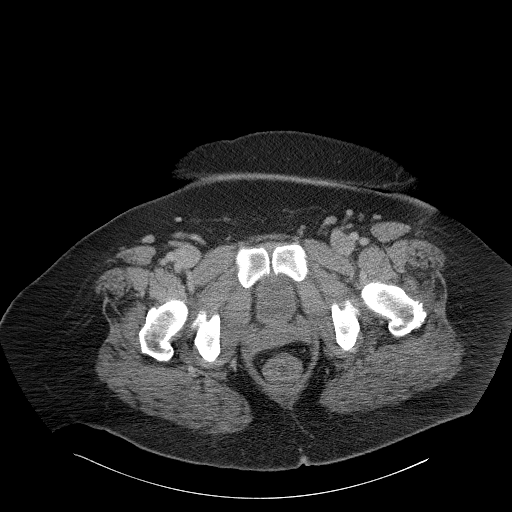
[im 18/93  soft-tissue]
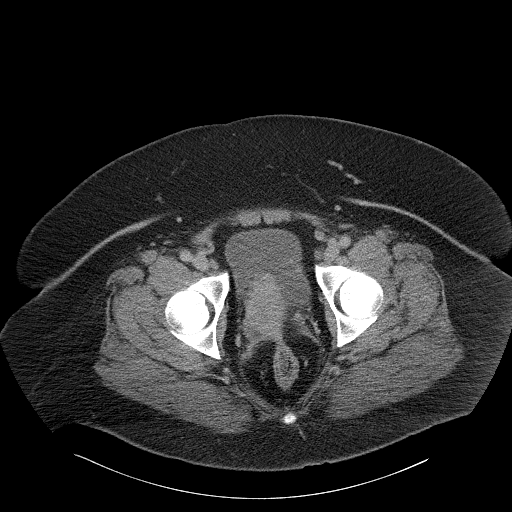
[im 24/93  soft-tissue]
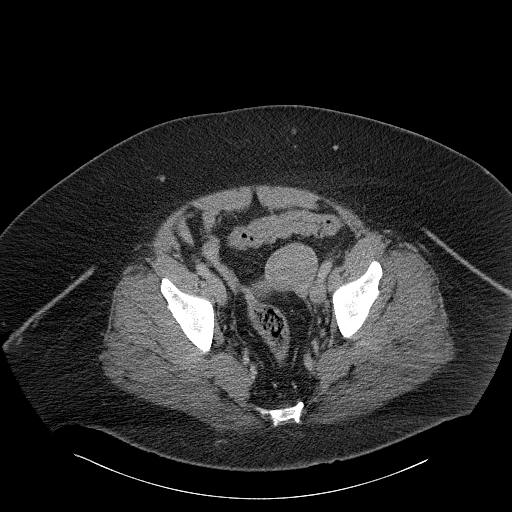
[im 29/93  soft-tissue]
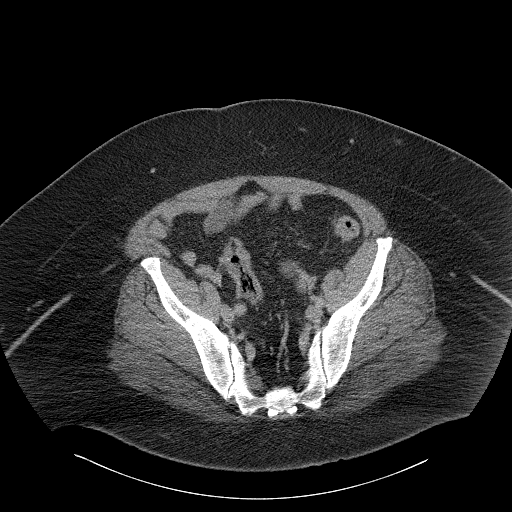
[im 35/93  soft-tissue]
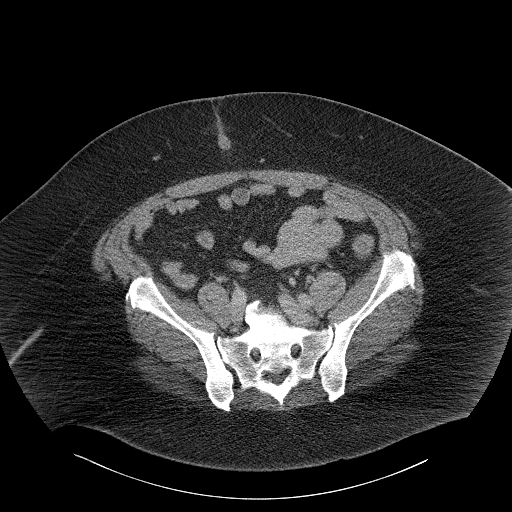
[im 41/93  soft-tissue]
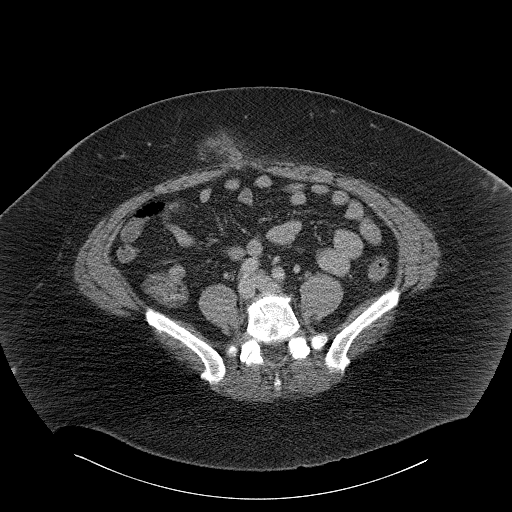
[im 52/93  soft-tissue]
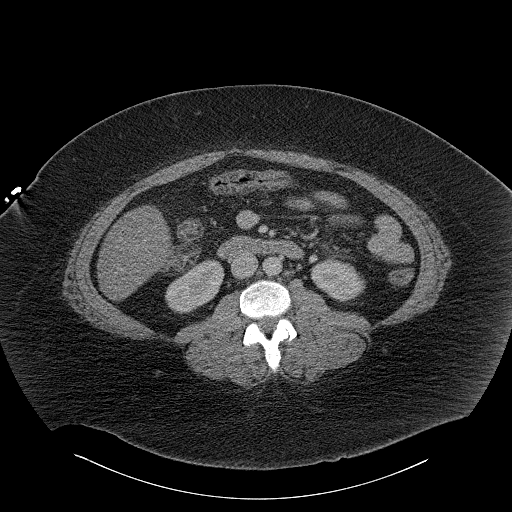
[im 58/93  soft-tissue]
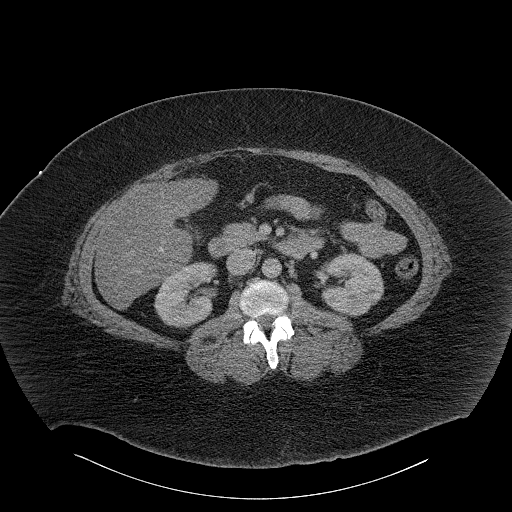
[im 58/93  bone]
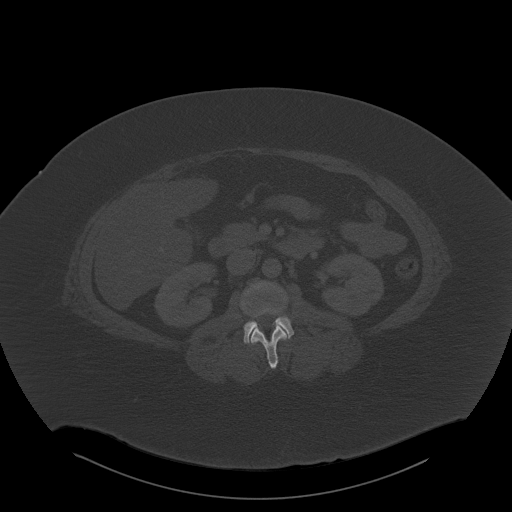
[im 64/93  soft-tissue]
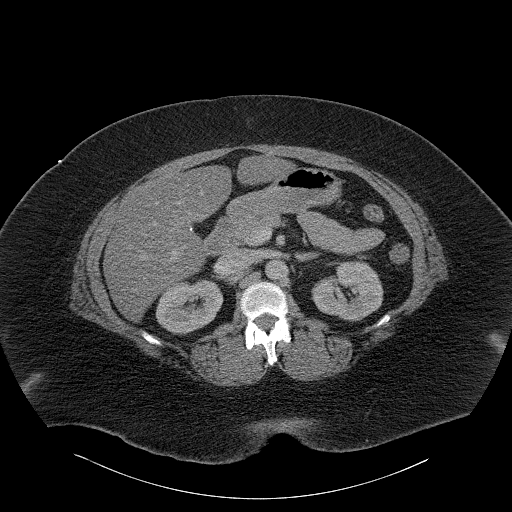
[im 70/93  soft-tissue]
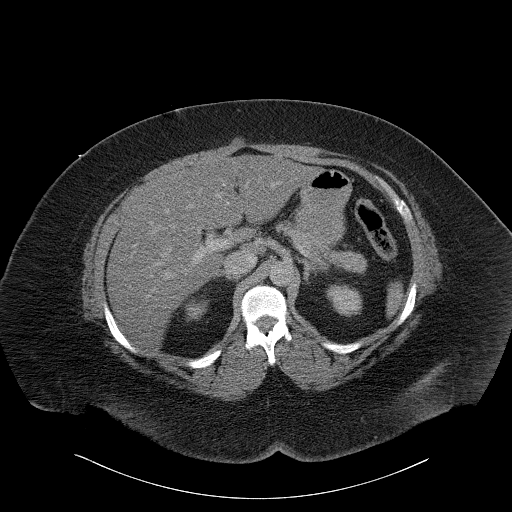
[im 75/93  soft-tissue]
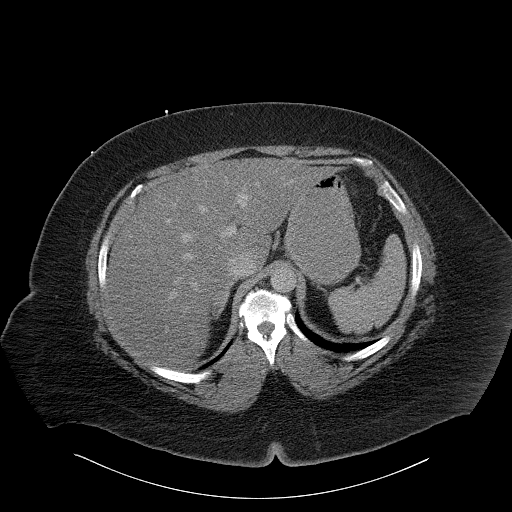
[im 81/93  soft-tissue]
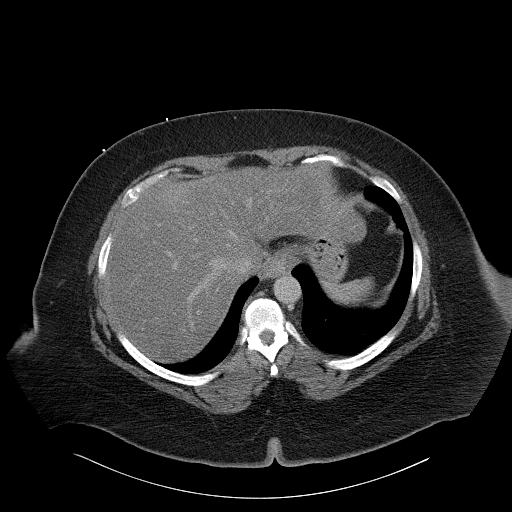
[im 87/93  soft-tissue]
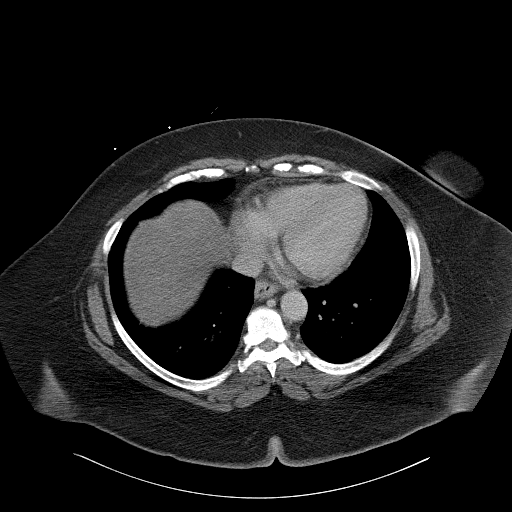

[Series 602: <mpr thick range> · coronal · 0.93mm/px · 3 of 175 slices shown]
[im 59/175  soft-tissue]
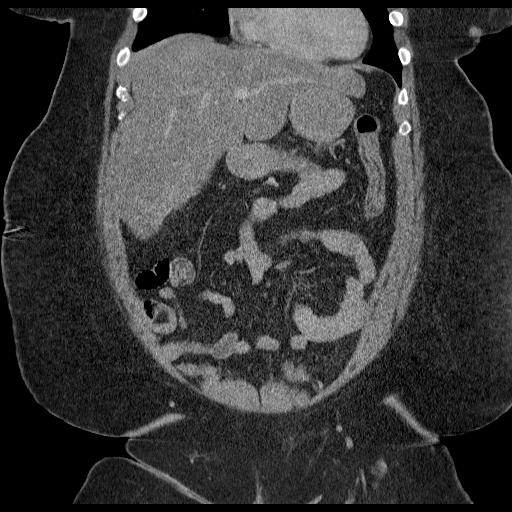
[im 78/175  soft-tissue]
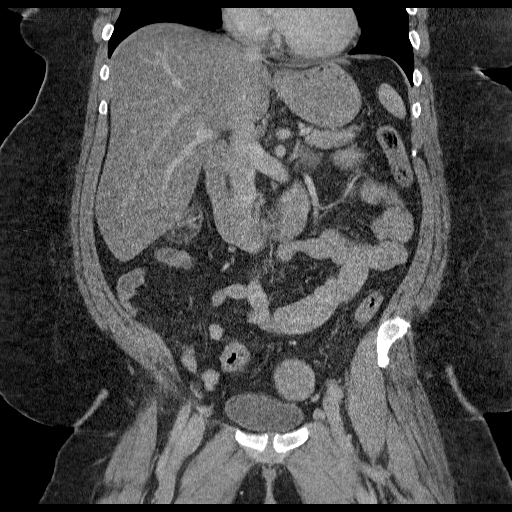
[im 97/175  soft-tissue]
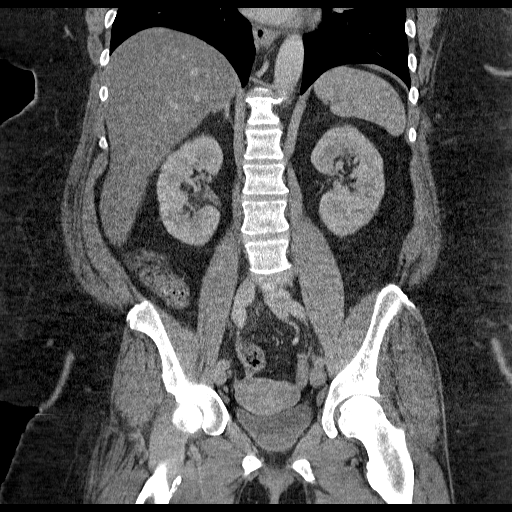

[17 of 46 positions shown; findings below may reference images not displayed]

FINDINGS: Lung bases are free of acute infiltrate or sizable effusion.

The gallbladder has been surgically removed. Fatty infiltration of
the liver is seen. The spleen, adrenal glands and pancreas are
within normal limits. The kidneys are well visualized bilaterally
within normal enhancement and excretion pattern. No renal calculi or
obstructive changes are seen.

Some inflammatory changes are noted near the umbilicus likely
related to the prior surgery although they are new from the recent
exam. No focal fluid collection is identified.

The appendix is not well visualized although no inflammatory changes
are seen to suggest appendicitis. The bladder is partially
distended. The uterus is well visualized. Likely uterine fibroid is
again noted on the left. It is stable in appearance. No free pelvic
fluid is noted. No bony abnormality is seen.
IMPRESSION: New inflammatory changes adjacent to the umbilicus which may be
related to localized infection of previous laparoscopic port.
Clinical correlation with port location is recommended.

The remainder of the study is stable from the prior exam. No other
acute abnormality is seen.

## 2017-09-29 IMAGING — DX DG ABD PORTABLE 1V
3 series · 3 of 3 positions shown · non-contrast
Comparison: CT, 08/08/2015

CLINICAL DATA: Lower abdominal pain with nausea and vomiting.

EXAM:
PORTABLE ABDOMEN - 1 VIEW

[abdomen kub (1 of 3)]
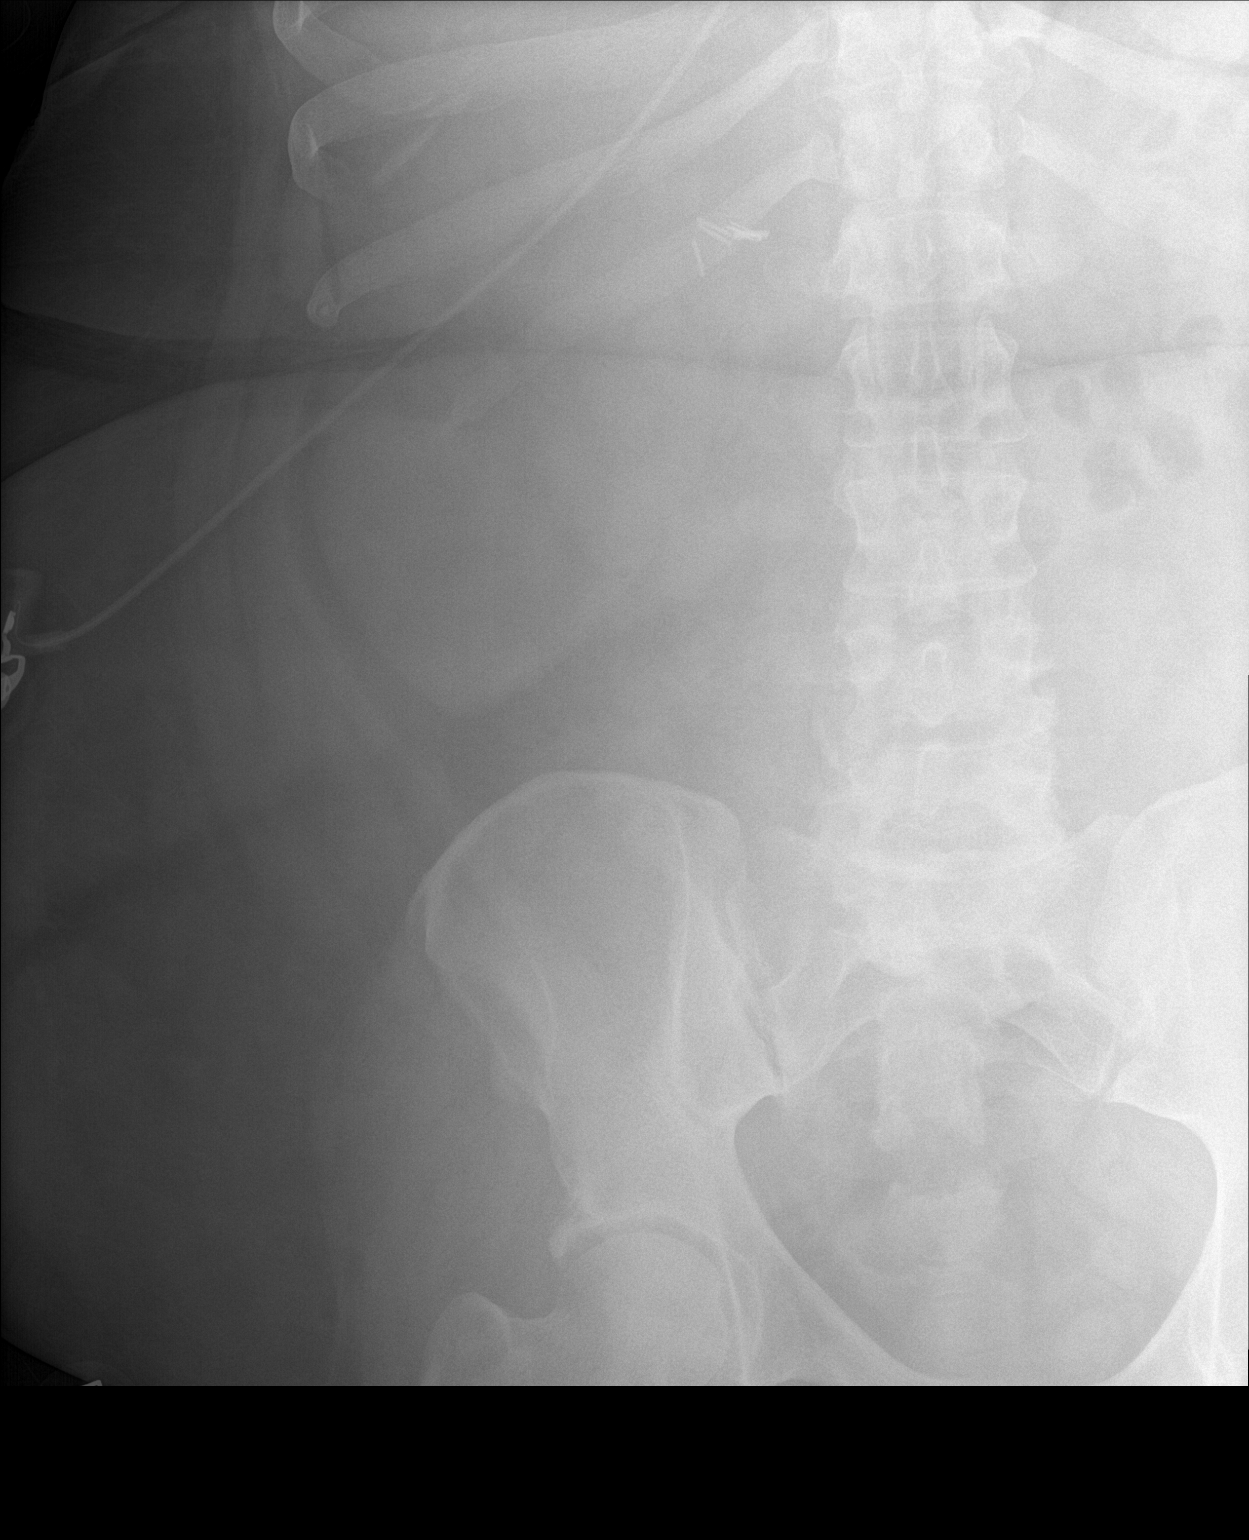

[abdomen kub (2 of 3)]
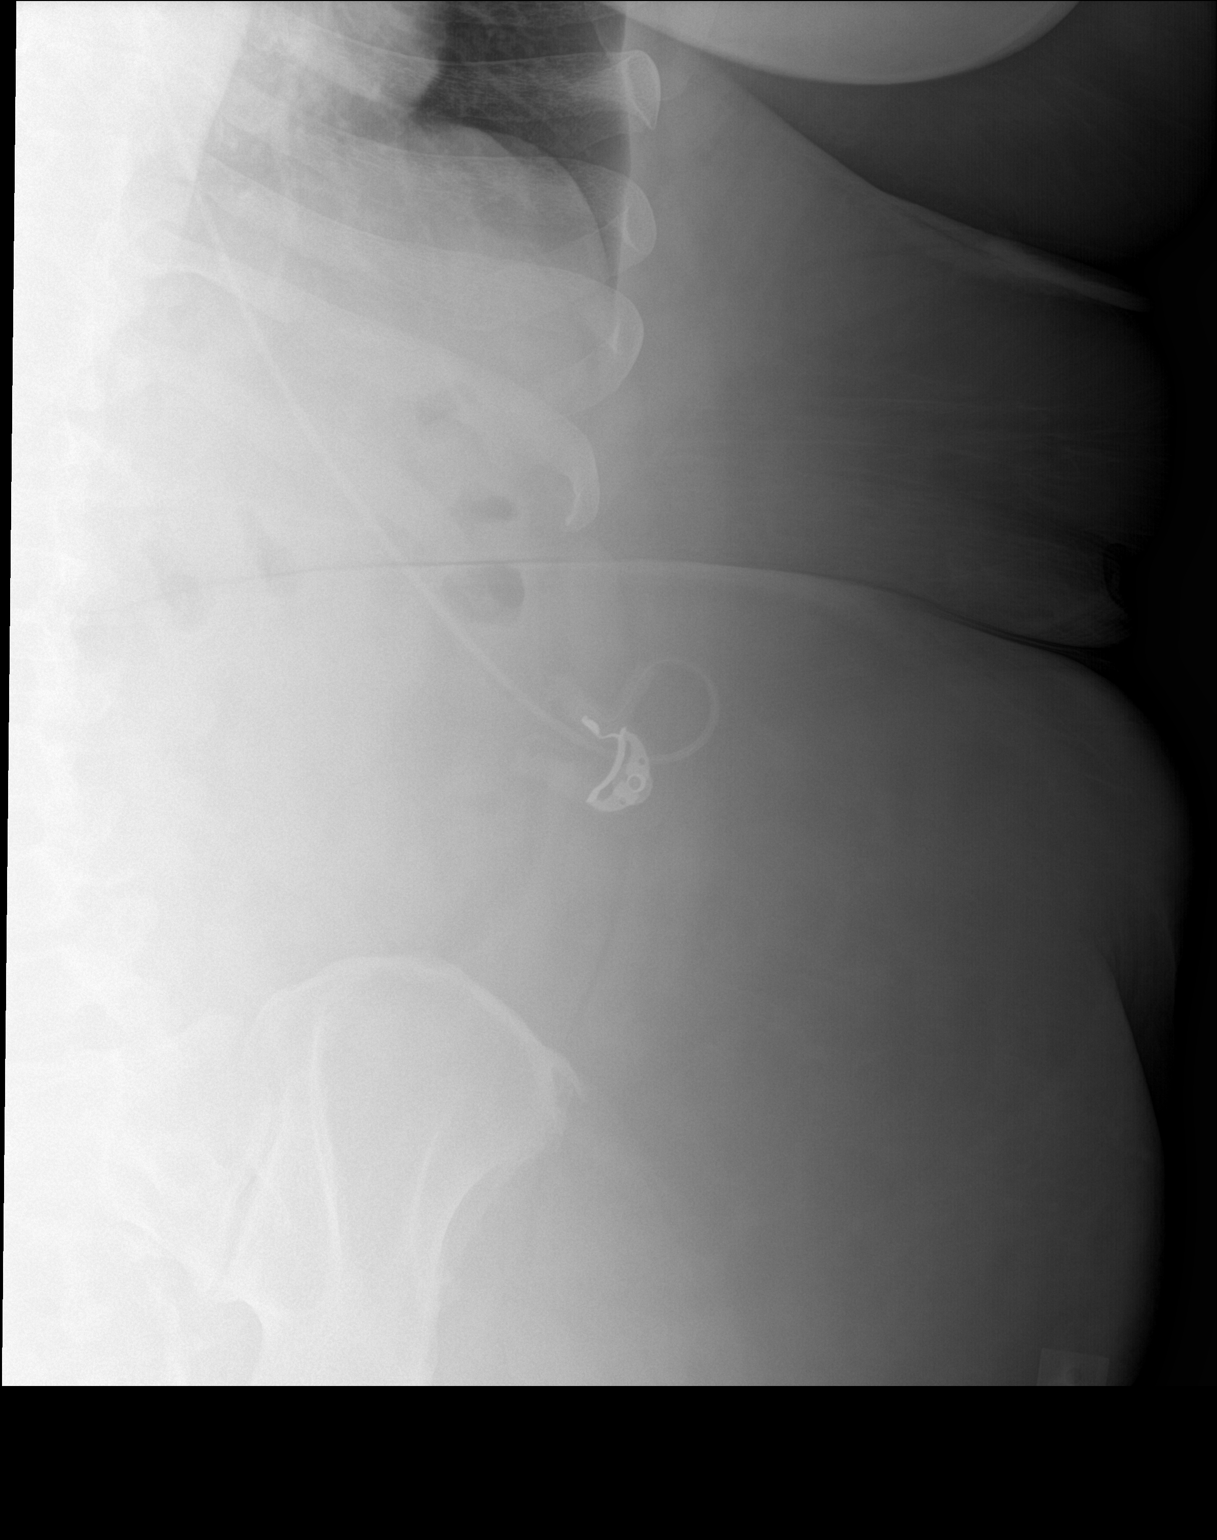

[abdomen kub (3 of 3)]
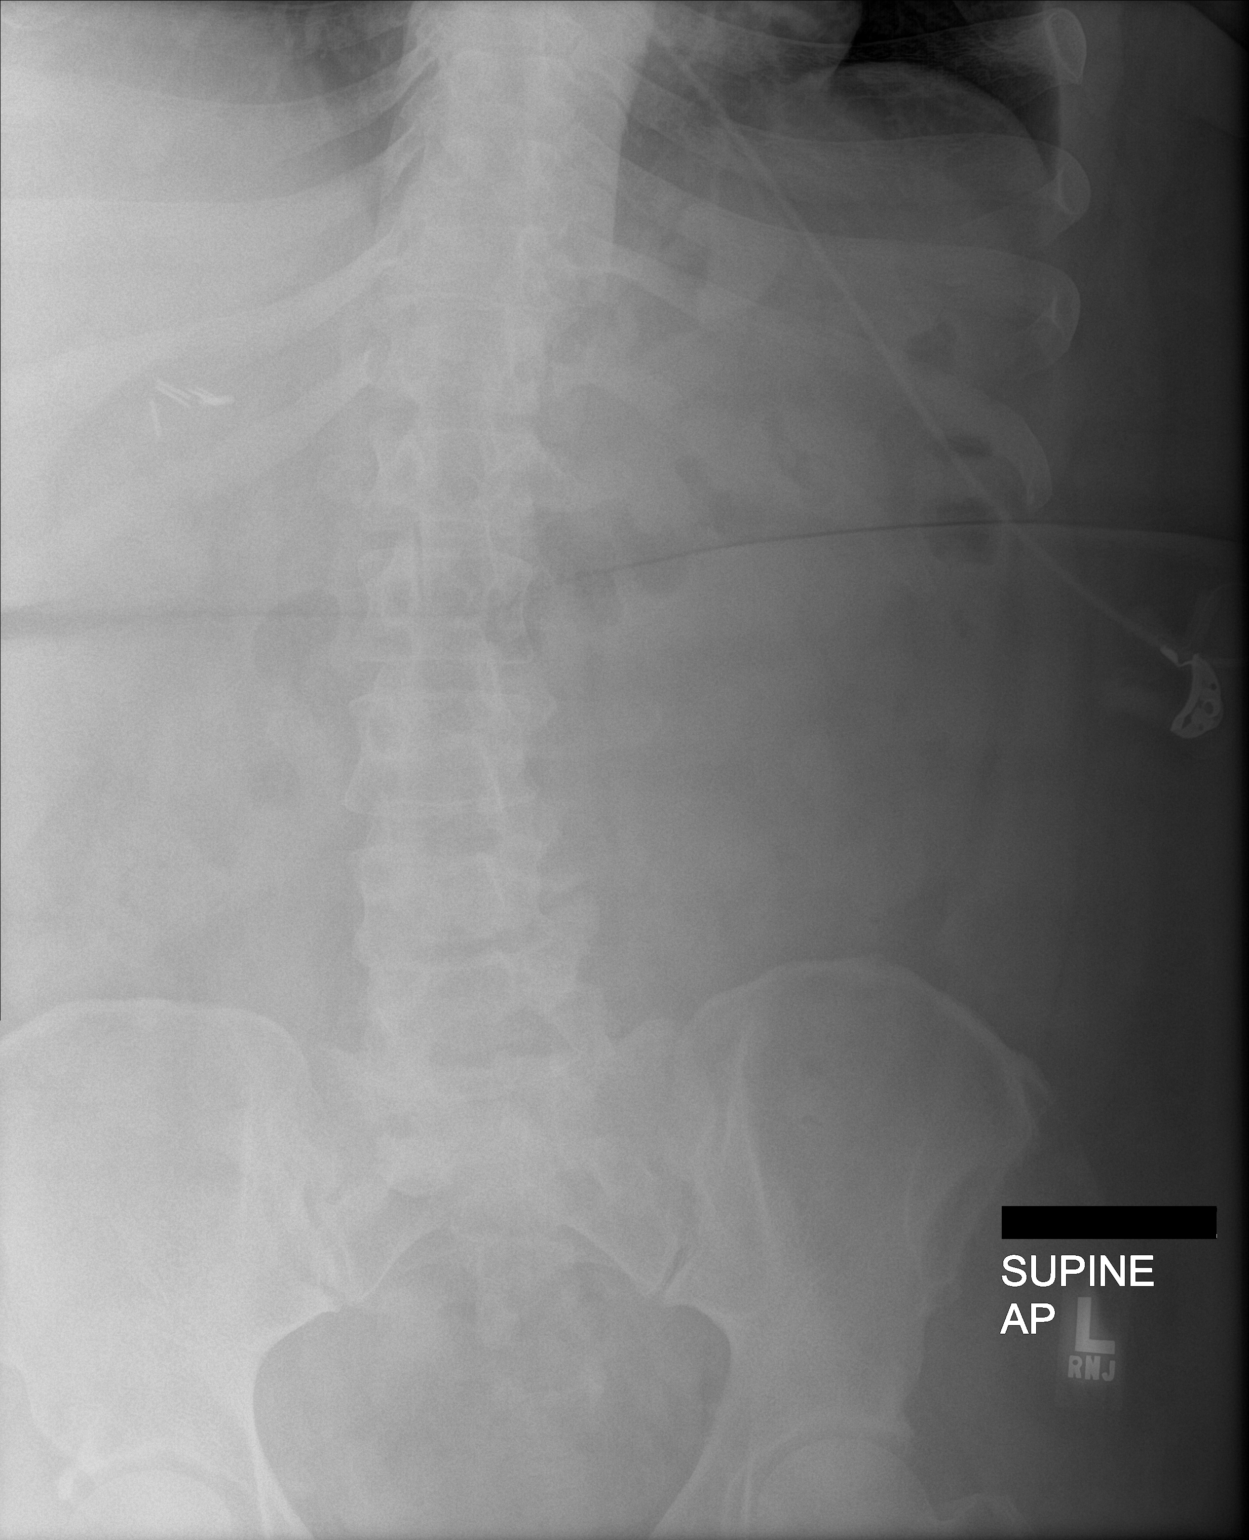

[3 of 3 positions shown; findings below may reference images not displayed]

FINDINGS: Normal bowel gas pattern.

Status post cholecystectomy. No evidence of renal or ureteral
stones. Soft tissues otherwise unremarkable.
IMPRESSION: 1. No acute finding.  No evidence of bowel obstruction.

## 2017-10-30 IMAGING — CR DG ABDOMEN 1V
2 series · 2 of 2 positions shown · non-contrast
Comparison: 08/21/2015

CLINICAL DATA: Abdominal pain, nausea and vomiting

EXAM:
ABDOMEN - 1 VIEW

[t abdomen supine (1 of 2)]
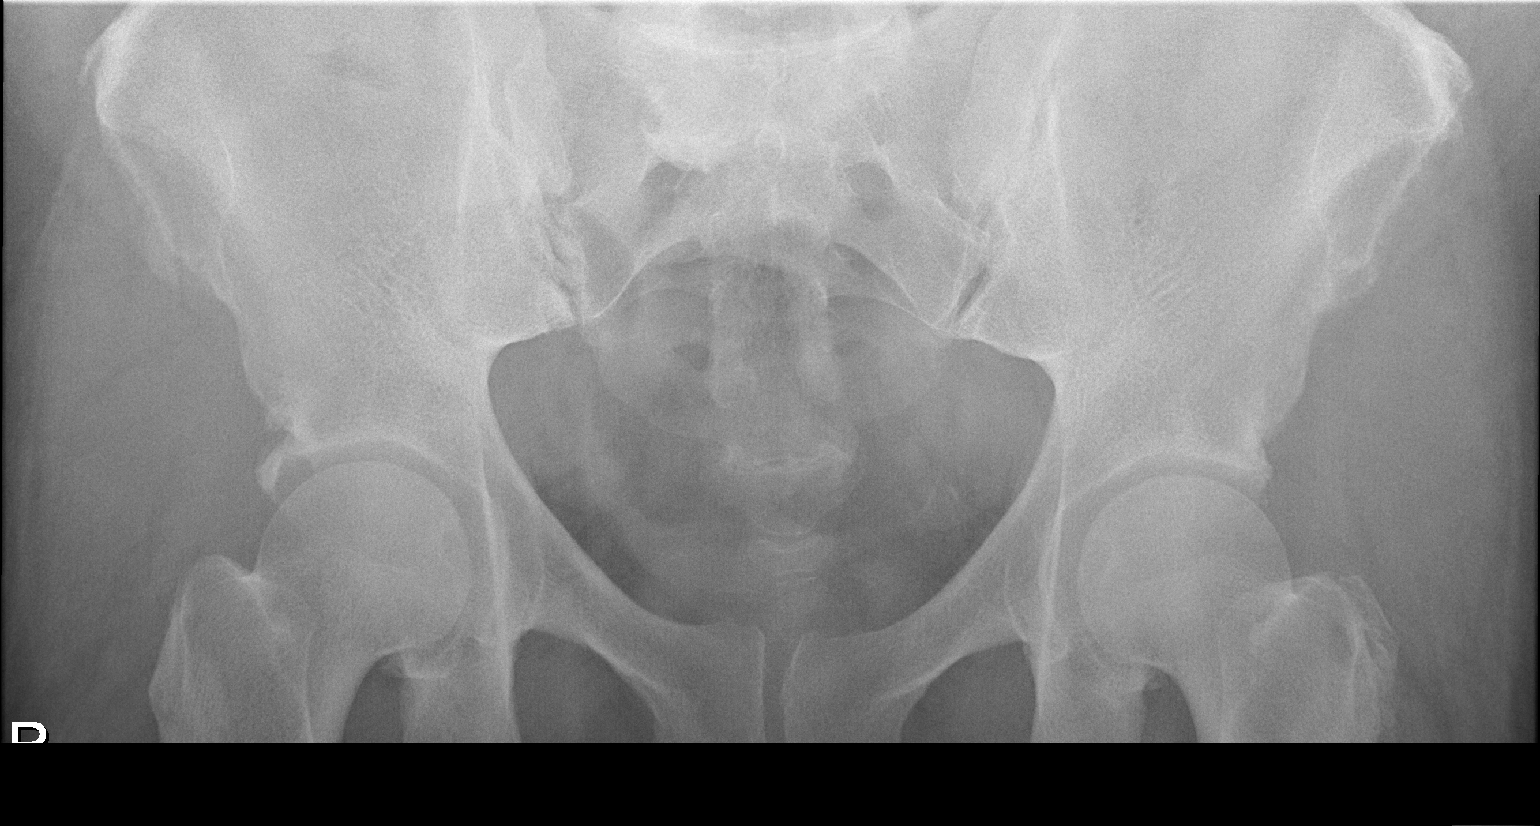

[t abdomen supine (2 of 2)]
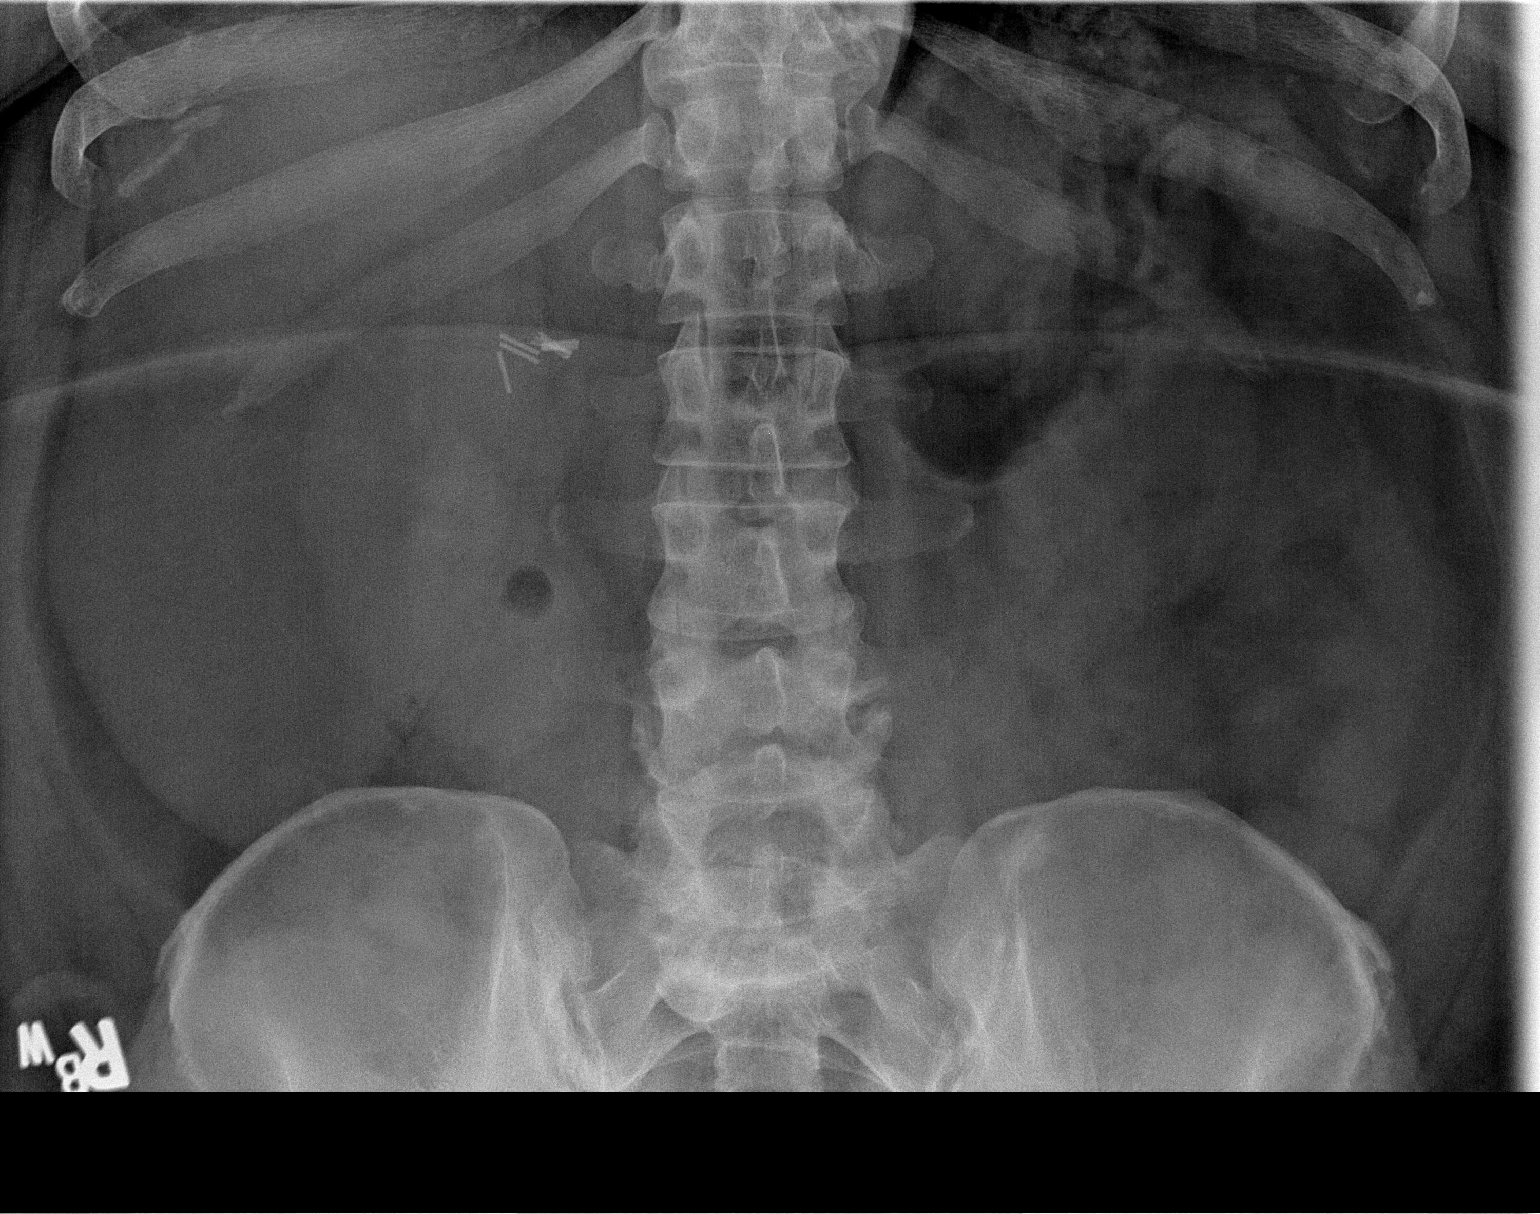

[2 of 2 positions shown; findings below may reference images not displayed]

FINDINGS: Cholecystectomy clips noted The bowel gas pattern is normal. No
radio-opaque calculi or other significant radiographic abnormality
are seen.
IMPRESSION: Negative.

## 2017-11-04 IMAGING — CR DG CHEST 2V
2 series · 2 of 2 positions shown · non-contrast
Comparison: None.

CLINICAL DATA: Chest and left arm pain, nausea/vomiting

EXAM:
CHEST  2 VIEW

[x chest ap]
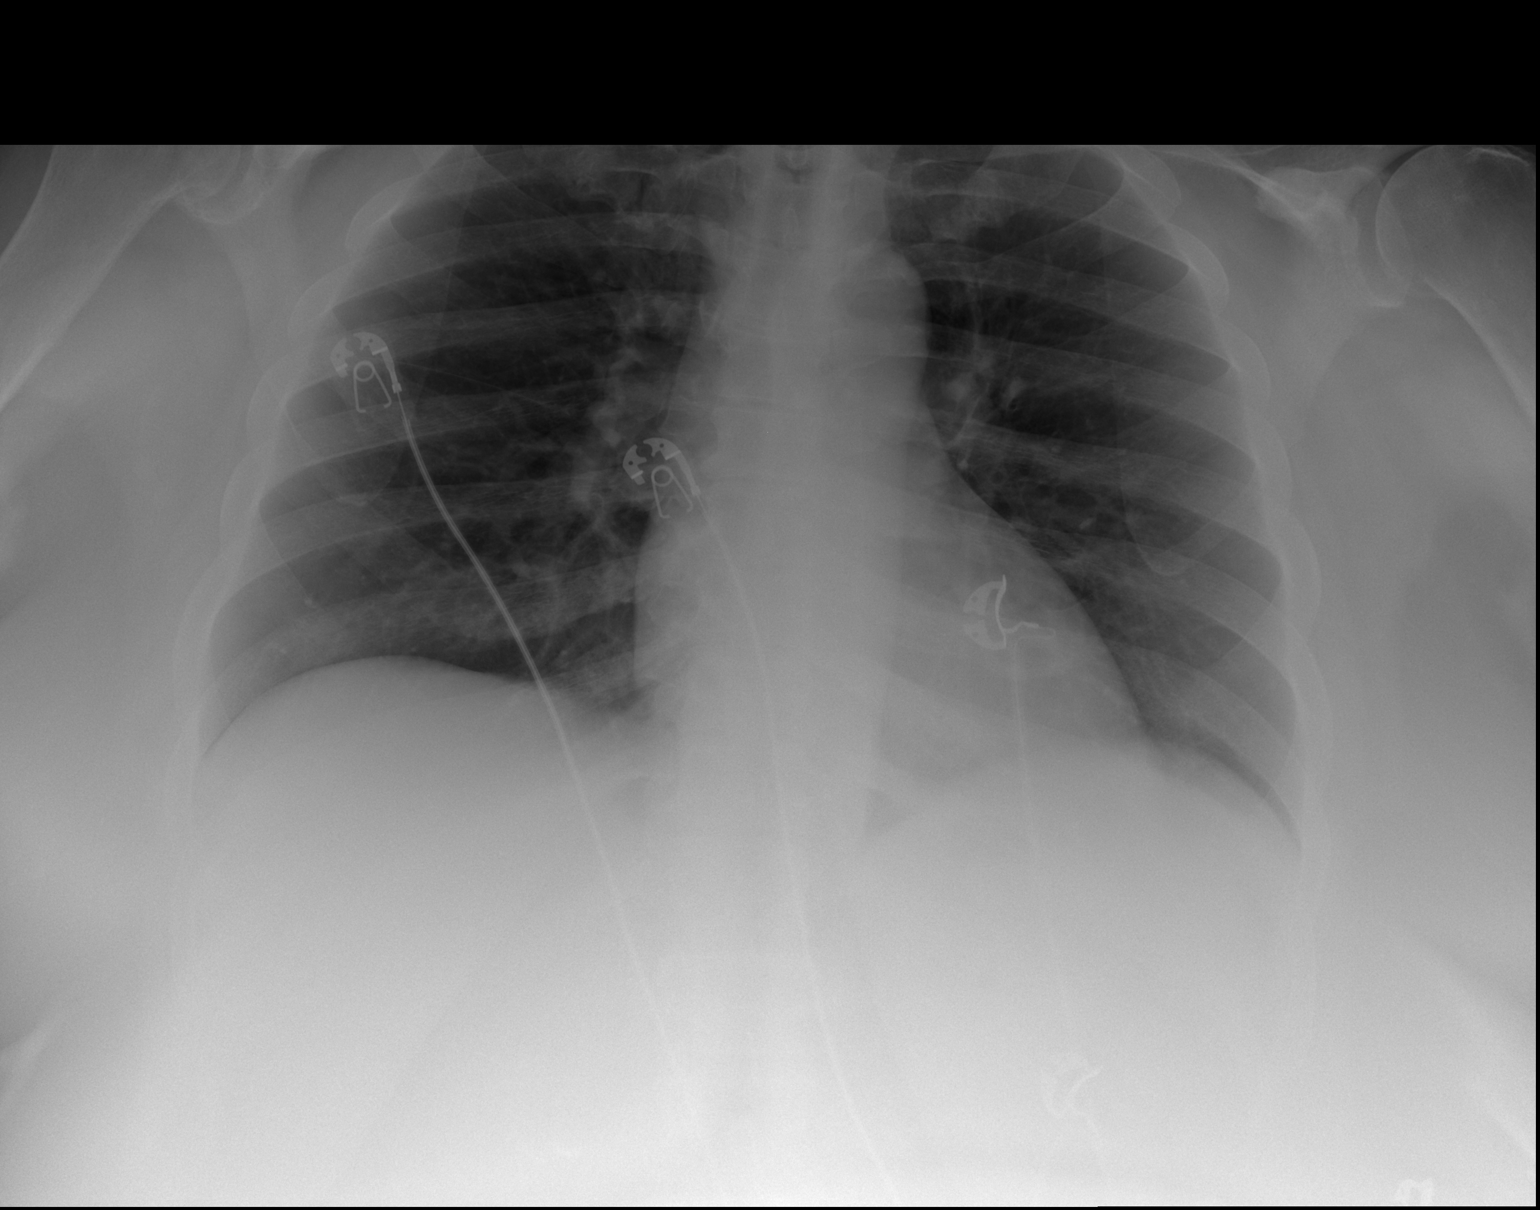

[w chest lat]
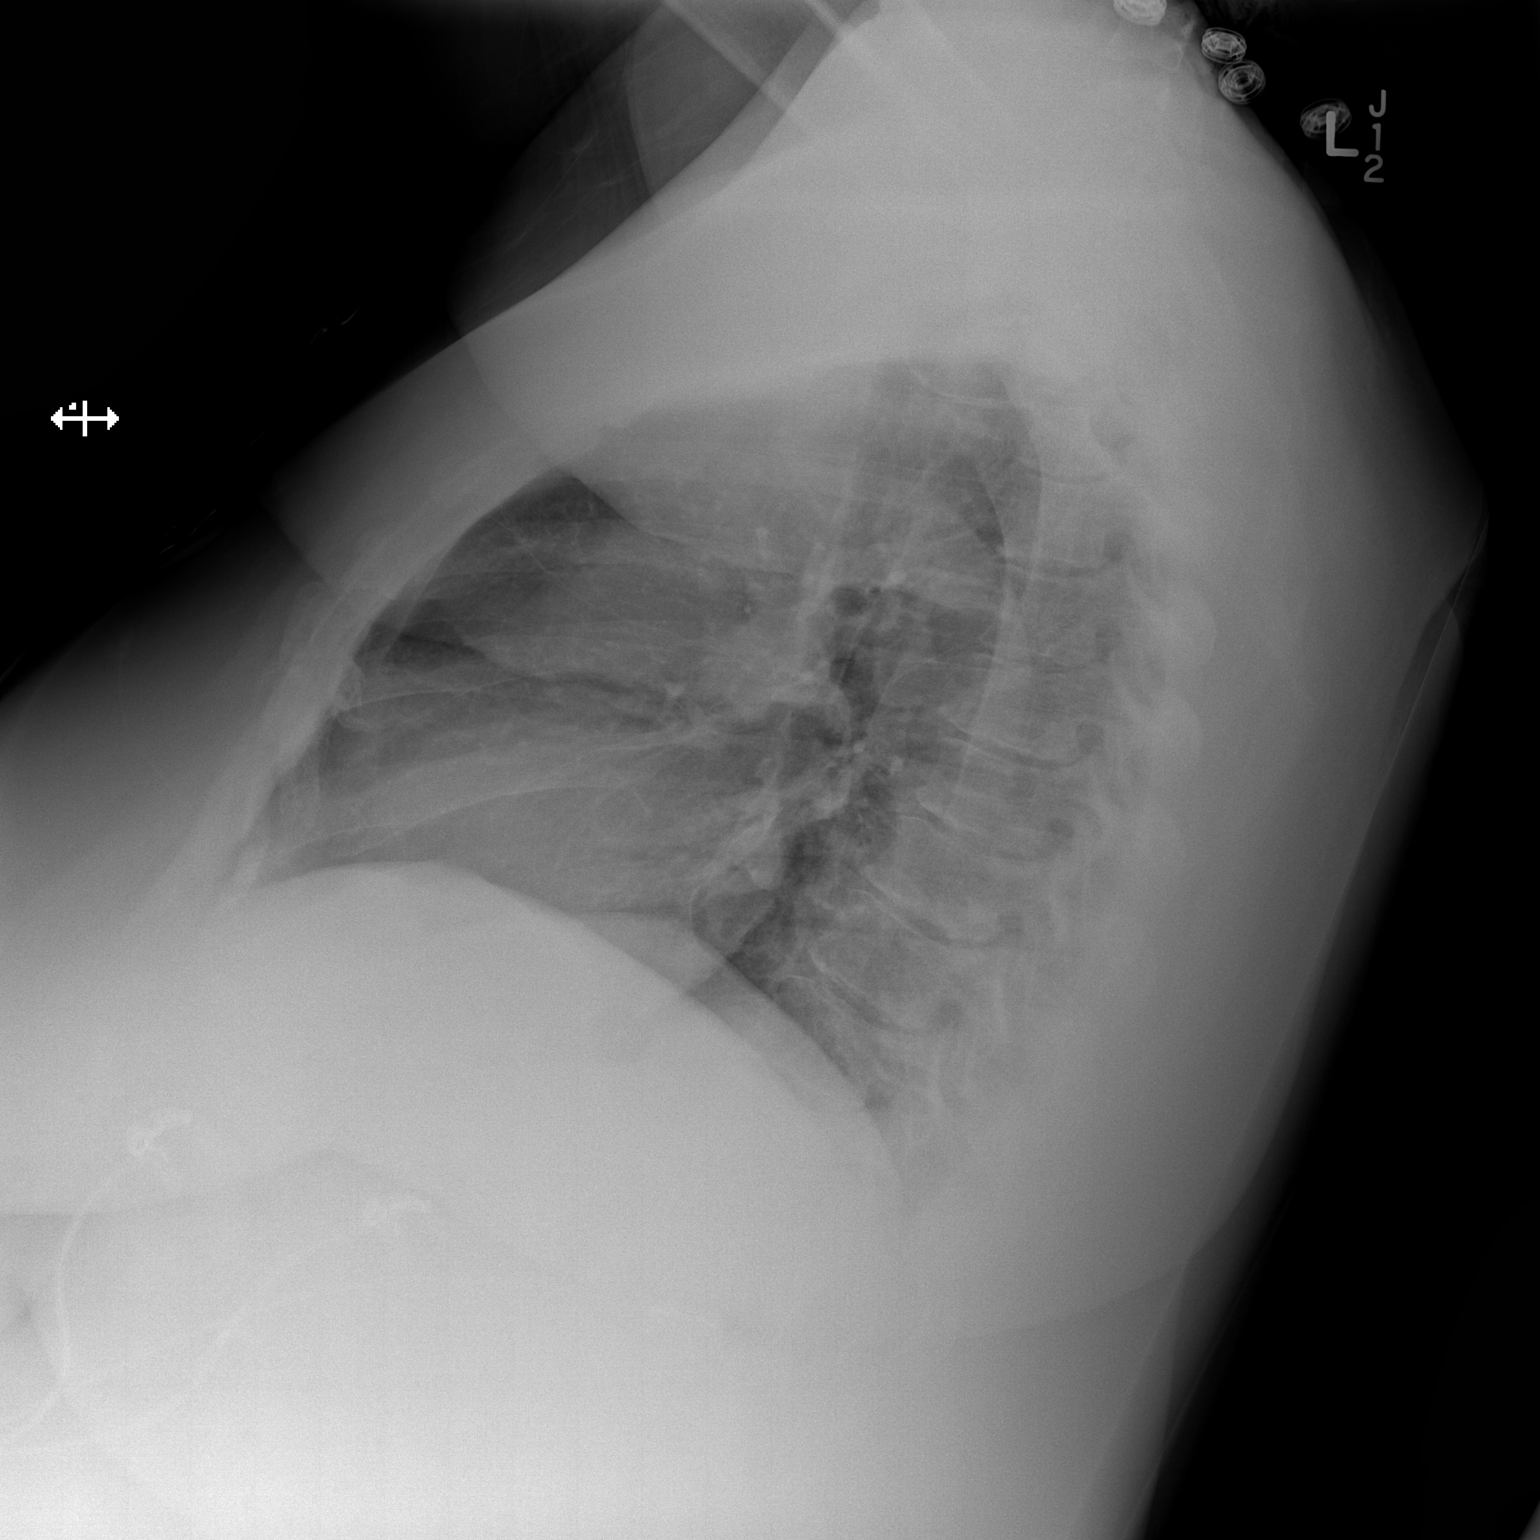

[2 of 2 positions shown; findings below may reference images not displayed]

FINDINGS: Lungs are clear.  No pleural effusion or pneumothorax.

The heart is normal in size.

Visualized osseous structures are within normal limits.
IMPRESSION: No evidence of acute cardiopulmonary disease.

## 2017-11-28 ENCOUNTER — Emergency Department (HOSPITAL_COMMUNITY): Payer: Medicaid Other

## 2017-11-28 ENCOUNTER — Inpatient Hospital Stay (HOSPITAL_COMMUNITY)
Admission: EM | Admit: 2017-11-28 | Discharge: 2017-12-02 | DRG: 296 | Disposition: A | Discharge status: Skilled Nursing Facility | Payer: Medicaid Other | Source: Skilled Nursing Facility | Attending: Internal Medicine | Admitting: Internal Medicine

## 2017-11-28 ENCOUNTER — Encounter (HOSPITAL_COMMUNITY): Payer: Self-pay | Admitting: Family Medicine

## 2017-11-28 DIAGNOSIS — E118 Type 2 diabetes mellitus with unspecified complications: Secondary | ICD-10-CM

## 2017-11-28 DIAGNOSIS — Z794 Long term (current) use of insulin: Secondary | ICD-10-CM

## 2017-11-28 DIAGNOSIS — T508X5A Adverse effect of diagnostic agents, initial encounter: Secondary | ICD-10-CM | POA: Diagnosis present

## 2017-11-28 DIAGNOSIS — I10 Essential (primary) hypertension: Secondary | ICD-10-CM | POA: Diagnosis present

## 2017-11-28 DIAGNOSIS — D638 Anemia in other chronic diseases classified elsewhere: Secondary | ICD-10-CM | POA: Diagnosis present

## 2017-11-28 DIAGNOSIS — R1900 Intra-abdominal and pelvic swelling, mass and lump, unspecified site: Secondary | ICD-10-CM

## 2017-11-28 DIAGNOSIS — Z6841 Body Mass Index (BMI) 40.0 and over, adult: Secondary | ICD-10-CM

## 2017-11-28 DIAGNOSIS — Z9849 Cataract extraction status, unspecified eye: Secondary | ICD-10-CM

## 2017-11-28 DIAGNOSIS — Z87892 Personal history of anaphylaxis: Secondary | ICD-10-CM

## 2017-11-28 DIAGNOSIS — M797 Fibromyalgia: Secondary | ICD-10-CM | POA: Diagnosis present

## 2017-11-28 DIAGNOSIS — D649 Anemia, unspecified: Secondary | ICD-10-CM | POA: Diagnosis present

## 2017-11-28 DIAGNOSIS — Z91018 Allergy to other foods: Secondary | ICD-10-CM

## 2017-11-28 DIAGNOSIS — D72829 Elevated white blood cell count, unspecified: Secondary | ICD-10-CM | POA: Diagnosis present

## 2017-11-28 DIAGNOSIS — Z86711 Personal history of pulmonary embolism: Secondary | ICD-10-CM

## 2017-11-28 DIAGNOSIS — K3184 Gastroparesis: Secondary | ICD-10-CM | POA: Diagnosis present

## 2017-11-28 DIAGNOSIS — I469 Cardiac arrest, cause unspecified: Secondary | ICD-10-CM | POA: Diagnosis present

## 2017-11-28 DIAGNOSIS — Z885 Allergy status to narcotic agent status: Secondary | ICD-10-CM

## 2017-11-28 DIAGNOSIS — R1030 Lower abdominal pain, unspecified: Secondary | ICD-10-CM | POA: Diagnosis not present

## 2017-11-28 DIAGNOSIS — Z8249 Family history of ischemic heart disease and other diseases of the circulatory system: Secondary | ICD-10-CM

## 2017-11-28 DIAGNOSIS — E1143 Type 2 diabetes mellitus with diabetic autonomic (poly)neuropathy: Secondary | ICD-10-CM

## 2017-11-28 DIAGNOSIS — Z8701 Personal history of pneumonia (recurrent): Secondary | ICD-10-CM

## 2017-11-28 DIAGNOSIS — Y92238 Other place in hospital as the place of occurrence of the external cause: Secondary | ICD-10-CM | POA: Diagnosis present

## 2017-11-28 DIAGNOSIS — Z880 Allergy status to penicillin: Secondary | ICD-10-CM

## 2017-11-28 DIAGNOSIS — R55 Syncope and collapse: Secondary | ICD-10-CM | POA: Diagnosis present

## 2017-11-28 DIAGNOSIS — R05 Cough: Secondary | ICD-10-CM

## 2017-11-28 DIAGNOSIS — M109 Gout, unspecified: Secondary | ICD-10-CM | POA: Diagnosis present

## 2017-11-28 DIAGNOSIS — Y92230 Patient room in hospital as the place of occurrence of the external cause: Secondary | ICD-10-CM | POA: Diagnosis present

## 2017-11-28 DIAGNOSIS — Z833 Family history of diabetes mellitus: Secondary | ICD-10-CM

## 2017-11-28 DIAGNOSIS — E785 Hyperlipidemia, unspecified: Secondary | ICD-10-CM | POA: Diagnosis present

## 2017-11-28 DIAGNOSIS — Z8711 Personal history of peptic ulcer disease: Secondary | ICD-10-CM

## 2017-11-28 DIAGNOSIS — Z8673 Personal history of transient ischemic attack (TIA), and cerebral infarction without residual deficits: Secondary | ICD-10-CM

## 2017-11-28 DIAGNOSIS — Z79899 Other long term (current) drug therapy: Secondary | ICD-10-CM

## 2017-11-28 DIAGNOSIS — I468 Cardiac arrest due to other underlying condition: Principal | ICD-10-CM | POA: Diagnosis present

## 2017-11-28 DIAGNOSIS — F419 Anxiety disorder, unspecified: Secondary | ICD-10-CM | POA: Diagnosis not present

## 2017-11-28 DIAGNOSIS — R059 Cough, unspecified: Secondary | ICD-10-CM

## 2017-11-28 DIAGNOSIS — N179 Acute kidney failure, unspecified: Secondary | ICD-10-CM | POA: Diagnosis present

## 2017-11-28 DIAGNOSIS — K219 Gastro-esophageal reflux disease without esophagitis: Secondary | ICD-10-CM | POA: Diagnosis present

## 2017-11-28 DIAGNOSIS — J189 Pneumonia, unspecified organism: Secondary | ICD-10-CM | POA: Diagnosis not present

## 2017-11-28 DIAGNOSIS — Z9049 Acquired absence of other specified parts of digestive tract: Secondary | ICD-10-CM

## 2017-11-28 DIAGNOSIS — Z888 Allergy status to other drugs, medicaments and biological substances status: Secondary | ICD-10-CM

## 2017-11-28 HISTORY — DX: Other chronic pain: G89.29

## 2017-11-28 HISTORY — DX: Vitamin B12 deficiency anemia, unspecified: D51.9

## 2017-11-28 HISTORY — DX: Lumbago with sciatica, right side: M54.41

## 2017-11-28 HISTORY — DX: Lumbago with sciatica, left side: M54.42

## 2017-11-28 HISTORY — DX: Spinal stenosis, lumbosacral region: M48.07

## 2017-11-28 HISTORY — DX: Anemia, unspecified: D64.9

## 2017-11-28 HISTORY — DX: Gastro-esophageal reflux disease without esophagitis: K21.9

## 2017-11-28 HISTORY — DX: Shortness of breath: R06.02

## 2017-11-28 LAB — CBC
HCT: 33.9 % — ABNORMAL LOW (ref 36.0–46.0)
Hemoglobin: 10.9 g/dL — ABNORMAL LOW (ref 12.0–15.0)
MCH: 28.6 pg (ref 26.0–34.0)
MCHC: 32.2 g/dL (ref 30.0–36.0)
MCV: 89 fL (ref 78.0–100.0)
PLATELETS: 444 10*3/uL — AB (ref 150–400)
RBC: 3.81 MIL/uL — AB (ref 3.87–5.11)
RDW: 13.3 % (ref 11.5–15.5)
WBC: 10.8 10*3/uL — AB (ref 4.0–10.5)

## 2017-11-28 LAB — COMPREHENSIVE METABOLIC PANEL
ALT: 20 U/L (ref 0–44)
AST: 20 U/L (ref 15–41)
Albumin: 3.6 g/dL (ref 3.5–5.0)
Alkaline Phosphatase: 91 U/L (ref 38–126)
Anion gap: 11 (ref 5–15)
BUN: 24 mg/dL — ABNORMAL HIGH (ref 6–20)
CHLORIDE: 106 mmol/L (ref 98–111)
CO2: 23 mmol/L (ref 22–32)
Calcium: 9.8 mg/dL (ref 8.9–10.3)
Creatinine, Ser: 1.24 mg/dL — ABNORMAL HIGH (ref 0.44–1.00)
GFR calc non Af Amer: 49 mL/min — ABNORMAL LOW (ref >60)
GFR, EST AFRICAN AMERICAN: 56 mL/min — AB (ref >60)
Glucose, Bld: 245 mg/dL — ABNORMAL HIGH (ref 70–99)
POTASSIUM: 4.1 mmol/L (ref 3.5–5.1)
Sodium: 140 mmol/L (ref 135–145)
Total Bilirubin: 0.7 mg/dL (ref 0.3–1.2)
Total Protein: 8.2 g/dL — ABNORMAL HIGH (ref 6.5–8.1)

## 2017-11-28 LAB — CBG MONITORING, ED: Glucose-Capillary: 244 mg/dL — ABNORMAL HIGH (ref 70–99)

## 2017-11-28 LAB — URINALYSIS, ROUTINE W REFLEX MICROSCOPIC
Bilirubin Urine: NEGATIVE
GLUCOSE, UA: 50 mg/dL — AB
KETONES UR: NEGATIVE mg/dL
Leukocytes, UA: NEGATIVE
Nitrite: NEGATIVE
PH: 5 (ref 5.0–8.0)
Protein, ur: 100 mg/dL — AB
SPECIFIC GRAVITY, URINE: 1.016 (ref 1.005–1.030)

## 2017-11-28 LAB — LIPASE, BLOOD: LIPASE: 35 U/L (ref 11–51)

## 2017-11-28 LAB — I-STAT TROPONIN, ED: Troponin i, poc: 0 ng/mL (ref 0.00–0.08)

## 2017-11-28 MED ORDER — ENOXAPARIN SODIUM 60 MG/0.6ML ~~LOC~~ SOLN
60.0000 mg | Freq: Every day | SUBCUTANEOUS | Status: DC
  Administered 2017-11-29 – 2017-12-01 (×4): 60 mg via SUBCUTANEOUS
  Filled 2017-11-28 (×4): qty 0.6

## 2017-11-28 MED ORDER — FAMOTIDINE IN NACL 20-0.9 MG/50ML-% IV SOLN
20.0000 mg | Freq: Once | INTRAVENOUS | Status: AC
  Administered 2017-11-28: 20 mg via INTRAVENOUS
  Filled 2017-11-28: qty 50

## 2017-11-28 MED ORDER — MORPHINE SULFATE (PF) 4 MG/ML IV SOLN
4.0000 mg | Freq: Once | INTRAVENOUS | Status: AC
  Administered 2017-11-28: 4 mg via INTRAVENOUS
  Filled 2017-11-28: qty 1

## 2017-11-28 MED ORDER — METHYLPREDNISOLONE SODIUM SUCC 125 MG IJ SOLR
125.0000 mg | Freq: Once | INTRAMUSCULAR | Status: AC
  Administered 2017-11-28: 125 mg via INTRAVENOUS
  Filled 2017-11-28: qty 2

## 2017-11-28 MED ORDER — IOPAMIDOL (ISOVUE-300) INJECTION 61%
INTRAVENOUS | Status: AC
  Filled 2017-11-28: qty 100

## 2017-11-28 MED ORDER — IOPAMIDOL (ISOVUE-300) INJECTION 61%
100.0000 mL | Freq: Once | INTRAVENOUS | Status: AC | PRN
  Administered 2017-11-28: 100 mL via INTRAVENOUS

## 2017-11-28 MED ORDER — ATORVASTATIN CALCIUM 10 MG PO TABS
10.0000 mg | ORAL_TABLET | Freq: Every day | ORAL | Status: DC
  Administered 2017-11-29 – 2017-12-01 (×3): 10 mg via ORAL
  Filled 2017-11-28 (×3): qty 1

## 2017-11-28 MED ORDER — MELATONIN 3 MG PO TABS
6.0000 mg | ORAL_TABLET | Freq: Every day | ORAL | Status: DC
  Administered 2017-11-29 – 2017-12-01 (×4): 6 mg via ORAL
  Filled 2017-11-28 (×4): qty 2

## 2017-11-28 MED ORDER — EPINEPHRINE PF 1 MG/10ML IJ SOSY
PREFILLED_SYRINGE | INTRAMUSCULAR | Status: AC
  Filled 2017-11-28: qty 10

## 2017-11-28 MED ORDER — METOCLOPRAMIDE HCL 5 MG/ML IJ SOLN
10.0000 mg | Freq: Once | INTRAMUSCULAR | Status: AC
  Administered 2017-11-28: 10 mg via INTRAVENOUS

## 2017-11-28 MED ORDER — LORATADINE 10 MG PO TABS
10.0000 mg | ORAL_TABLET | Freq: Every day | ORAL | Status: DC
  Administered 2017-11-29 – 2017-12-02 (×4): 10 mg via ORAL
  Filled 2017-11-28 (×4): qty 1

## 2017-11-28 MED ORDER — MAGNESIUM SULFATE 2 GM/50ML IV SOLN
2.0000 g | Freq: Once | INTRAVENOUS | Status: AC
  Administered 2017-11-29: 2 g via INTRAVENOUS
  Filled 2017-11-28: qty 50

## 2017-11-28 MED ORDER — DIPHENHYDRAMINE HCL 50 MG/ML IJ SOLN
25.0000 mg | Freq: Once | INTRAMUSCULAR | Status: AC
  Administered 2017-11-28: 25 mg via INTRAVENOUS
  Filled 2017-11-28: qty 1

## 2017-11-28 MED ORDER — METOCLOPRAMIDE HCL 5 MG/ML IJ SOLN
10.0000 mg | Freq: Once | INTRAMUSCULAR | Status: DC
  Filled 2017-11-28: qty 2

## 2017-11-28 MED ORDER — AMLODIPINE BESYLATE 10 MG PO TABS
10.0000 mg | ORAL_TABLET | Freq: Every day | ORAL | Status: DC
  Administered 2017-11-30 – 2017-12-02 (×3): 10 mg via ORAL
  Filled 2017-11-28: qty 1
  Filled 2017-11-28: qty 2
  Filled 2017-11-28 (×2): qty 1

## 2017-11-28 MED ORDER — INSULIN GLARGINE 100 UNIT/ML ~~LOC~~ SOLN
40.0000 [IU] | Freq: Every day | SUBCUTANEOUS | Status: DC
  Administered 2017-11-29 – 2017-12-01 (×4): 40 [IU] via SUBCUTANEOUS
  Filled 2017-11-28 (×5): qty 0.4

## 2017-11-28 MED ORDER — ALPRAZOLAM 0.25 MG PO TABS
0.2500 mg | ORAL_TABLET | Freq: Two times a day (BID) | ORAL | Status: DC | PRN
  Administered 2017-11-29: 0.25 mg via ORAL
  Filled 2017-11-28: qty 1

## 2017-11-28 MED ORDER — METOCLOPRAMIDE HCL 10 MG PO TABS
10.0000 mg | ORAL_TABLET | Freq: Three times a day (TID) | ORAL | Status: DC
  Administered 2017-11-29 – 2017-12-02 (×14): 10 mg via ORAL
  Filled 2017-11-28 (×15): qty 1

## 2017-11-28 MED ORDER — METOPROLOL SUCCINATE ER 25 MG PO TB24
25.0000 mg | ORAL_TABLET | Freq: Every day | ORAL | Status: DC
  Administered 2017-11-29 – 2017-12-02 (×4): 25 mg via ORAL
  Filled 2017-11-28 (×4): qty 1

## 2017-11-28 MED ORDER — PANTOPRAZOLE SODIUM 40 MG PO TBEC
40.0000 mg | DELAYED_RELEASE_TABLET | Freq: Every day | ORAL | Status: DC
  Administered 2017-11-29 – 2017-12-02 (×4): 40 mg via ORAL
  Filled 2017-11-28 (×4): qty 1

## 2017-11-28 MED ORDER — INSULIN ASPART 100 UNIT/ML ~~LOC~~ SOLN
0.0000 [IU] | Freq: Three times a day (TID) | SUBCUTANEOUS | Status: DC
  Administered 2017-11-29: 11 [IU] via SUBCUTANEOUS
  Administered 2017-11-29: 15 [IU] via SUBCUTANEOUS
  Administered 2017-11-30 (×3): 3 [IU] via SUBCUTANEOUS
  Administered 2017-12-01 (×2): 2 [IU] via SUBCUTANEOUS
  Administered 2017-12-01: 8 [IU] via SUBCUTANEOUS
  Administered 2017-12-02: 5 [IU] via SUBCUTANEOUS
  Administered 2017-12-02: 2 [IU] via SUBCUTANEOUS

## 2017-11-28 MED ORDER — SODIUM CHLORIDE 0.9 % IV SOLN
INTRAVENOUS | Status: DC
  Administered 2017-11-29 – 2017-12-01 (×7): via INTRAVENOUS

## 2017-11-28 MED ORDER — DULOXETINE HCL 30 MG PO CPEP
30.0000 mg | ORAL_CAPSULE | Freq: Two times a day (BID) | ORAL | Status: DC
  Administered 2017-11-29 – 2017-12-02 (×8): 30 mg via ORAL
  Filled 2017-11-28 (×8): qty 1

## 2017-11-28 MED ORDER — SODIUM CHLORIDE 0.9 % IV BOLUS
500.0000 mL | Freq: Once | INTRAVENOUS | Status: AC
  Administered 2017-11-28: 500 mL via INTRAVENOUS

## 2017-11-28 NOTE — ED Provider Notes (Signed)
[facility] DEPT Provider Note   [name]: [id number] Arrival date & time: 11/28/17  1323     History   Chief Complaint Chief Complaint  Patient presents with  . Abdominal Pain    HPI Deborah [name] is a 52 y.o. female with a past medical history of stroke, DM complicated by gastroparesis, hypertension, obesity, PE, who presents today for evaluation of lower abdominal pain and vomiting.  She reports that this feels exactly like her normal gastroparesis.  She denies any different symptoms or concerns than her usual.  She Reports the pain is worse in her lower abdomen, radiating up to her chest.  She denies diarrhea.  She reports she is not sure if she is passing gas, however notes no bowel movement since this started yesterday.  She says the facility gave her PO reglan but that did not help as she vomited it back up.  No Shob.  No fevers.   HPI  Past Medical History:  Diagnosis Date  . Acute back pain with sciatica, left   . Acute back pain with sciatica, right   . Anemia, unspecified   . Chest pain 12/2015  . Chronic pain   . Diabetes mellitus   . Esophageal reflux   . Fibromyalgia   . Gastric ulcer   . Gastroparesis   . Gout   . Hyperlipidemia   . Hypertension   . Lumbosacral stenosis   . Obesity   . Pneumonia   . [name]   . Stroke ([place]) 02/2011  . Vitamin B12 deficiency anemia     Patient Active Problem List   Diagnosis Date Noted  . Palliative care encounter   . Back pain 03/19/2017  . Stroke (cerebrum) ([place]) 03/19/2017  . GERD (gastroesophageal reflux disease) 03/19/2017  . Depression 03/19/2017  . Obesity   . Urinary tract infection 08/16/2016  . Anemia 08/16/2016  . Gastroparesis 08/16/2016  . Intractable nausea and vomiting 06/17/2016  . Diabetic gastroparesis ([place]) 06/05/2016  . Gout 06/05/2016  . AKI (acute kidney injury) ([place]) 12/06/2015  . Hypokalemia 09/26/2015  . Hypomagnesemia 09/26/2015  . Nausea  and vomiting 08/20/2015  . Gout flare 05/27/2015  . Abdominal pain 05/26/2015  . DKA (diabetic ketoacidoses) ([place]) 05/25/2015  . Uncontrolled type 2 diabetes mellitus with diabetic neuropathy, with long-term current use of insulin ([place]) 05/25/2015  . Dyslipidemia associated with type 2 diabetes mellitus ([place]) 05/25/2015  . CKD (chronic kidney disease), stage II 05/25/2015  . Essential hypertension, benign 09/28/2013    Past Surgical History:  Procedure Laterality Date  . CATARACT EXTRACTION  01/2014  . CHOLECYSTECTOMY       OB History   None      Home Medications    Prior to Admission medications   Medication Sig Start Date End Date Taking? Authorizing Provider  allopurinol (ZYLOPRIM) 100 MG tablet Take 1 tablet (100 mg total) by mouth daily. 09/23/15   Debbe Odea, MD  ALPRAZolam Duanne Moron) 0.25 MG tablet Take 0.25 mg by mouth 2 (two) times daily.    [provider]  amLODipine (NORVASC) 10 MG tablet Take 1 tablet (10 mg total) by mouth daily. Patient not taking: Reported on 08/15/2017 08/02/17   Rai, Vernelle Emerald, MD  dicyclomine (BENTYL) 10 MG capsule Take 1 capsule (10 mg total) by mouth 4 (four) times daily. Patient not taking: Reported on 08/15/2017 08/02/17   Rai, Vernelle Emerald, MD  DULoxetine (CYMBALTA) 30 MG capsule Take 1 capsule (30 mg total) by  mouth 2 (two) times daily. 09/23/15   Debbe Odea, MD  furosemide (LASIX) 20 MG tablet Take 60 mg by mouth 2 (two) times daily.    [provider]  hyoscyamine (LEVSIN SL) 0.125 MG SL tablet Place 2 tablets (0.25 mg total) under the tongue every 6 (six) hours as needed for cramping (abdominal pain.). Patient not taking: Reported on 08/15/2017 08/02/17   Rai, Vernelle Emerald, MD  insulin glargine (LANTUS) 100 UNIT/ML injection Inject 40 Units into the skin at bedtime. 04/01/17   [provider]  insulin lispro (HUMALOG) 100 UNIT/ML injection Inject 10 Units into the skin 3 (three) times daily. 04/01/17   [provider]  Magnesium Oxide 400 (240 Mg) MG TABS Take 1 tablet (400 mg total) by mouth daily. Patient not taking: Reported on 08/15/2017 08/02/17   Rai, Vernelle Emerald, MD  methocarbamol (ROBAXIN) 500 MG tablet Take 500 mg by mouth 2 (two) times daily.    [provider]  metoCLOPramide (REGLAN) 10 MG tablet Take 1 tablet (10 mg total) by mouth 3 (three) times daily before meals. Patient taking differently: Take 10 mg by mouth 4 (four) times daily -  before meals and at bedtime.  08/02/17   Rai, Vernelle Emerald, MD  metoprolol succinate (TOPROL-XL) 25 MG 24 hr tablet Take 1 tablet (25 mg total) by mouth daily. Patient not taking: Reported on 08/15/2017 08/02/17   Rai, Vernelle Emerald, MD  oxyCODONE (OXY IR/ROXICODONE) 5 MG immediate release tablet Take 5 mg by mouth every 4 (four) hours as needed for severe pain.    [provider]  pantoprazole (PROTONIX) 40 MG tablet Take 1 tablet (40 mg total) by mouth 2 (two) times daily. Patient taking differently: Take 40 mg by mouth daily.  08/02/17   Rai, Ripudeep K, MD  potassium chloride (K-DUR) 10 MEQ tablet Take 2 tablets (20 mEq total) by mouth daily for 7 days. 08/02/17 08/09/17  Rai, Ripudeep K, MD  potassium chloride SA (K-DUR,KLOR-CON) 20 MEQ tablet Take 20 mEq by mouth daily.    [provider]  sucralfate (CARAFATE) 1 g tablet Take 1 tablet (1 g total) by mouth 4 (four) times daily -  with meals and at bedtime. 08/02/17   [name], MD    Family History Family History  Problem Relation Age of Onset  . Diabetes Mother   . Diabetes Father   . Heart disease Father   . Diabetes Sister   . Congestive Heart Failure Sister 20  . Diabetes Brother     Social History Social History   Tobacco Use  . Smoking status: Never Smoker  . Smokeless tobacco: Never Used  Substance Use Topics  . Alcohol use: No  . Drug use: No     Allergies   Diazepam; Lisinopril; Penicillins; Acetaminophen; Tolmetin; Aspirin; Food; Nsaids; and  Tramadol   Review of Systems Review of Systems  Constitutional: Negative for chills and fever.  HENT: Negative for congestion.   Eyes: Negative for visual disturbance.  Respiratory: Negative for shortness of breath.   Cardiovascular: Positive for chest pain. Negative for palpitations.  Gastrointestinal: Positive for abdominal pain, nausea and vomiting. Negative for abdominal distention, constipation and diarrhea.  Genitourinary: Negative for dysuria, hematuria and urgency.  Neurological: Negative for weakness.     Physical Exam Updated Vital Signs BP (!) 158/97 (BP Location: Left Arm)   Pulse (!) 101   Temp (!) 97.5 F (36.4 C) (Oral)   Resp (!) 23   Ht  5\' 6"  (1.676 m)   Wt (!) 142.2 kg   LMP 10/10/2012   SpO2 100%   BMI 50.60 kg/m   Physical Exam  Constitutional: She appears well-developed and well-nourished.  Non-toxic appearance. No distress.  HENT:  Head: Normocephalic and atraumatic.  Mouth/Throat: Oropharynx is clear and moist.  Eyes: Conjunctivae are normal.  Neck: Neck supple.  Cardiovascular: Normal rate, regular rhythm, normal heart sounds and intact distal pulses.  No murmur heard. Pulmonary/Chest: Effort normal and breath sounds normal. No respiratory distress.  Abdominal: Soft. Normal appearance and bowel sounds are normal. There is generalized tenderness.  Musculoskeletal: She exhibits no edema.  Neurological: She is alert.  Skin: Skin is warm and dry.  Psychiatric: She has a normal mood and affect.  Nursing note and vitals reviewed.    ED Treatments / Results  Labs (all labs ordered are listed, but only abnormal results are displayed) Labs Reviewed  COMPREHENSIVE METABOLIC PANEL - Abnormal; Notable for the following components:      Result Value   Glucose, Bld 245 (*)    BUN 24 (*)    Creatinine, Ser 1.24 (*)    Total Protein 8.2 (*)    GFR calc non Af Amer 49 (*)    GFR calc Af Amer 56 (*)    All other components within normal limits  CBC  - Abnormal; Notable for the following components:   WBC 10.8 (*)    RBC 3.81 (*)    Hemoglobin 10.9 (*)    HCT 33.9 (*)    Platelets 444 (*)    All other components within normal limits  URINALYSIS, ROUTINE W REFLEX MICROSCOPIC - Abnormal; Notable for the following components:   Glucose, UA 50 (*)    Hgb urine dipstick MODERATE (*)    Protein, ur 100 (*)    Bacteria, UA RARE (*)    All other components within normal limits  LIPASE, BLOOD  I-STAT TROPONIN, ED    EKG EKG Interpretation  Date/Time:  Monday November 28 2017 15:48:35 EDT Ventricular Rate:  94 PR Interval:    QRS Duration: 101 QT Interval:  393 QTC Calculation: 492 R Axis:   -36 Text Interpretation:  Sinus rhythm Left axis deviation Low voltage, precordial leads Borderline prolonged QT interval Confirmed by Gerlene Fee [phone]) on 11/28/2017 4:00:49 PM   Radiology Ct Abdomen Pelvis W Contrast  Result Date: 11/28/2017 CLINICAL DATA:  52 year old with abdominal pain. EXAM: CT ABDOMEN AND PELVIS WITH CONTRAST TECHNIQUE: Multidetector CT imaging of the abdomen and pelvis was performed using the standard protocol following bolus administration of intravenous contrast. CONTRAST:  143mL ISOVUE-300 IOPAMIDOL (ISOVUE-300) INJECTION 61% COMPARISON:  Chest CT 04/20/2016 FINDINGS: Lower chest: Lung bases are clear. Hepatobiliary: Cholecystectomy. Normal appearance of the liver. Portal venous system is patent. No suspicious liver lesions. No biliary dilatation. Pancreas: Unremarkable. No pancreatic ductal dilatation or surrounding inflammatory changes. Spleen: Normal in size without focal abnormality. Adrenals/Urinary Tract: Normal adrenals. Urinary bladder is unremarkable. No hydronephrosis and no suspicious renal lesions. However, there is no evidence for contrast excretion on the 2 minute delayed renal images. Stomach/Bowel: Normal appearance of the stomach and duodenum. Normal appearance of small bowel without obstruction. No  acute abnormality to the colon. There is a normal appendix. Vascular/Lymphatic: Aortic calcifications without aneurysm. No significant lymph node enlargement in the abdomen or pelvis. Reproductive: There is a rounded solid structure in the left adnexal region that measures 4.3 x 4.6 x 5.2 cm. Structure has similar density to the  adjacent uterus on this post contrast examination. Normal appearance of the right ovary/adnexa region. Other: No free fluid.  Negative for free air. Musculoskeletal: Degenerative facet disease in lower lumbar spine. Disc space narrowing and large anterior bridging osteophytes at L5-S1. IMPRESSION: 1. Indeterminate 5.2 cm solid structure in the left adnexa region. Differential diagnosis includes a left ovarian/adnexal mass versus a pedunculated uterine fibroid. Recommend further characterization with a pelvic ultrasound. 2. No excretion of iodinated contrast on the 2 minute delayed renal images. Findings can be associated with renal dysfunction and recommend follow-up metabolic panel to assess kidney function. Electronically Signed   By: Markus Daft M.D.   On: 11/28/2017 22:22   Dg Chest Port 1 View  Result Date: 11/28/2017 CLINICAL DATA:  CPR. EXAM: PORTABLE CHEST 1 VIEW COMPARISON:  None. FINDINGS: The heart size and mediastinal contours are within normal limits. Both lungs are clear. The visualized skeletal structures are unremarkable. IMPRESSION: Clear lungs. Electronically Signed   By: Ulyses Jarred M.D.   On: 11/28/2017 22:46    Procedures Procedures (including critical care time) CRITICAL CARE Performed by: Wyn Quaker Total critical care time: 45 minutes Critical care time was exclusive of separately billable procedures and treating other patients. Critical care was necessary to treat or prevent imminent or life-threatening deterioration. Critical care was time spent personally by me on the following activities: development of treatment plan with patient and/or  surrogate as well as nursing, discussions with consultants, evaluation of patient's response to treatment, examination of patient, obtaining history from patient or surrogate, ordering and performing treatments and interventions, ordering and review of laboratory studies, ordering and review of radiographic studies, pulse oximetry and re-evaluation of patient's condition.   Medications Ordered in ED Medications  morphine 4 MG/ML injection 4 mg (has no administration in time range)  sodium chloride 0.9 % bolus 500 mL (0 mLs Intravenous Stopped 11/28/17 1713)  morphine 4 MG/ML injection 4 mg (4 mg Intravenous Given 11/28/17 1534)  metoCLOPramide (REGLAN) injection 10 mg (10 mg Intravenous Given 11/28/17 1532)     Initial Impression / Assessment and Plan / ED Course  I have reviewed the triage vital signs and the nursing notes.  Pertinent labs & imaging results that were available during my care of the patient were reviewed by me and considered in my medical decision making (see chart for details).  Clinical Course as of Nov 29 200  Mon Nov 28, 2017  1529 OLD EKG reviewed x2.  Pt Qtc appears to run on the longer side, however she gets reglan often and does not have complications from it.    [EH]  1640 Patient stated not ready for PO challenge.  Still feels consistent with her gastroparesis.  Chest pain has resolved.  She wants to give the reglan more time to work.    [EH]  1824 Re-evaluated patient, she is now saying that this no longer feels like her gastroparesis pain and is feeling different, that it usually gets resolved with medications and her pain location has changed.  Will order CT scan.    [EH]  2208 Code blue called in CT scan.  Patient got contrast, sat up took a big breath and then went apenic.  Pulses were not able to be palpated.  Epi given, patient moved out of CT scanner and started to sit up.  Wake up.    [EH]  2226 Spoke with Dr. Oletta Darter from Critical care who states that if  she deteriorates PCCM will come see patient,  otherwise recommends step down hospitliast admit.    [EH]  2252 Added IV contrast to her allergy list.  Was not on list prior to this.    [EH]  2301  Spoke with charge Rn.  Informed her that my concern patient is getting diaphoretic, cool and clamy.  Ordered CBG, Remainder of meds for allergy 45 minutes ago.  Charge RN is aware.    [EH]    Clinical Course User Index [EH] Ollen Gross   [name] presents today for evaluation of abdominal pain.  Initially this was reportedly with hyperglycemia, Urine with protein and blood which appears consistent with her usual labs.  White count mildly elevated at 10.8.  After Reglan (her home dose was given IV), morphine, fluids, patient's pain shifted from being generalized TTP, to being in lower quadrants, L>R and patient felt that this was not consistent with her usual gastroparesis any more.  Given abdominal pain is not consistent with her normal any more with L>R concern for diverticulitis, CT scan ordered.  Patient was in the CT scan when code blue was called after she got contrast, sat up took a breath and lay down with twitching and pulses were not able to be palpated.  CPR was started.  Patient was down for under one minute, got one amp of epi, and ROSC was achieved, and she returned to baseline mental function.  Due to concern for contrast reaction IV benadryl, histamine 2 blocker, and steroids were ordered.  Critical care was consulted who was concerned for vagal reaction from the contrast rather than true arrest.  Patient was monitored in the ED.  CT showed concern for a mass in the LLQ, recommended pelvic ultrasound.  Hospitalist was consulted who agreed to admit patient for step down.    This patient was seen by Dr. Sedonia Small who was present during resuscitation.    Final Clinical Impressions(s) / ED Diagnoses   Final diagnoses:  Lower abdominal pain  Cardiac arrest [facility])  Pelvic mass  in female  Diabetic gastroparesis ([place])  Allergic reaction to contrast media, initial encounter    ED Discharge Orders    None       Ollen Gross 11/29/17 1736    Maudie Flakes, MD 12/01/17 [phone]

## 2017-11-28 NOTE — ED Notes (Signed)
Pt up to bedside with stand by assist

## 2017-11-28 NOTE — ED Notes (Signed)
Pt provided with Water per PA. Pa at bedside

## 2017-11-28 NOTE — H&P (Signed)
History and Physical    Deborah [name] VPC:[id number] DOB: 1965/10/07 DOA: 11/28/2017  Referring MD/NP/PA: Wyn Quaker, PA-C PCP: Patient, No Pcp Per  Patient coming from: [facility] rehabilitation via EMS  Chief Complaint: Abdominal pain with vomiting  I have personally briefly reviewed patient's old medical records in [place]   HPI: Deborah [name] is a 52 y.o. female with medical history significant of HTN, HLD, and IDDM with complications of gastroparesis; who presents with complaints of lower abdominal pain with nausea and vomiting since yesterday afternoon.  The pain is constant, sharp, and stabbing in nature.  Patient admits that she had similar symptoms like this in the past related to gastroparesis.  Patient had been given p.o. Reglan without any relief of symptoms.  Denies having any fever, dysuria, shortness of breath, or diarrhea symptoms.  ED Course: Upon admission to the emergency department patient was noted to be afebrile, pulse 94-104, respirations 16-23, and all other vital signs maintained.  Labs revealed WBC 10.8, hemoglobin 10.9, platelet 444, BUN 24, creatinine 1.24, glucose 245, lipase 35, and troponin 0.    Patient was initially treated with 500 mL of normal saline IV fluids, morphine, and 10 mg of Reglan.  However, abdominal pain symptoms persisted and a CT of the abdomen and pelvis with IV contrast was ordered.  CT scan showed a 5.2 cm solid structure in the left axilla region.  Sometime after IV contrast administration the patient reportedly lost a pulse, and received about 1 minute of CPR with epinephrine given prior to return of spontaneous circulation.  Patient reports feels like she was in a dream, and does not recall what happened.  PCCM was consulted, but suspected possible vagal response.  Patient was given 25 mg of Benadryl, 20 mg of Pepcid, 125 mg of Solu-Medrol.  TRH called to admit.  Review of Systems  Constitutional: Positive for  malaise/fatigue. Negative for chills and fever.  HENT: Negative for ear discharge and nosebleeds.   Eyes: Negative for photophobia and pain.  Respiratory: Negative for cough and shortness of breath.   Cardiovascular: Negative for leg swelling.  Gastrointestinal: Positive for abdominal pain, nausea and vomiting. Negative for diarrhea.  Genitourinary: Negative for dysuria and hematuria.  Musculoskeletal: Negative for falls and neck pain.  Neurological: Positive for weakness. Negative for focal weakness and loss of consciousness.  Psychiatric/Behavioral: Negative for memory loss and substance abuse.    Past Medical History:  Diagnosis Date  . Acute back pain with sciatica, left   . Acute back pain with sciatica, right   . Anemia, unspecified   . Chest pain 12/2015  . Chronic pain   . Diabetes mellitus   . Esophageal reflux   . Fibromyalgia   . Gastric ulcer   . Gastroparesis   . Gout   . Hyperlipidemia   . Hypertension   . Lumbosacral stenosis   . Obesity   . Pneumonia   . [name]   . Stroke ([place]) 02/2011  . Vitamin B12 deficiency anemia     Past Surgical History:  Procedure Laterality Date  . CATARACT EXTRACTION  01/2014  . CHOLECYSTECTOMY       reports that she has never smoked. She has never used smokeless tobacco. She reports that she does not drink alcohol or use drugs.  Allergies  Allergen Reactions  . Contrast Media [Iodinated Diagnostic Agents] Anaphylaxis    Cardiac arrest  . Diazepam [name]  . Isovue [Iopamidol] Anaphylaxis  Patient had seizure like activity and then code post 100 cc of isovue 300  . Lisinopril Anaphylaxis    Tongue and mouth swelling  . Penicillins Palpitations    Has patient had a PCN reaction causing immediate rash, facial/tongue/throat swelling, SOB or lightheadedness with hypotension: Yes, heart races Has patient had a PCN reaction causing severe rash involving mucus membranes or skin necrosis: No Has  patient had a PCN reaction that required hospitalization: Yes  Has patient had a PCN reaction occurring within the last 10 years: No   . Acetaminophen Nausea Only and Other (See Comments)    Irritates stomach ulcer  Abdominal pain  . Tolmetin Nausea Only    Other reaction(s): Other (See Comments) ULCER  . Aspirin Other (See Comments)    Irritates stomach ulcer   . Food Hives and Swelling    Carrots, ketchup   . Nsaids Other (See Comments)    ULCER  . Tramadol Nausea And Vomiting    Family History  Problem Relation Age of Onset  . Diabetes Mother   . Diabetes Father   . Heart disease Father   . Diabetes Sister   . Congestive Heart Failure Sister 25  . Diabetes Brother     Prior to Admission medications   Medication Sig Start Date End Date Taking? Authorizing Provider  allopurinol (ZYLOPRIM) 100 MG tablet Take 1 tablet (100 mg total) by mouth daily. 09/23/15  Yes Debbe Odea, MD  amLODipine (NORVASC) 10 MG tablet Take 1 tablet (10 mg total) by mouth daily. 08/02/17  Yes Rai, Ripudeep K, MD  aspirin (ECOTRIN LOW STRENGTH) 81 MG EC tablet Take 81 mg by mouth daily. Swallow whole.   Yes [provider]  atorvastatin (LIPITOR) 10 MG tablet Take 10 mg by mouth daily.   Yes [provider]  cetirizine (ZYRTEC) 10 MG tablet Take 10 mg by mouth daily.   Yes [provider]  Cyanocobalamin 1000 MCG/ML KIT Inject 1,000 mcg as directed every 30 (thirty) days.   Yes [provider]  DULoxetine (CYMBALTA) 30 MG capsule Take 1 capsule (30 mg total) by mouth 2 (two) times daily. 09/23/15  Yes Debbe Odea, MD  insulin glargine (LANTUS) 100 UNIT/ML injection Inject 40 Units into the skin at bedtime. 04/01/17  Yes [provider]  insulin lispro (HUMALOG) 100 UNIT/ML injection Inject 10 Units into the skin 3 (three) times daily. 04/01/17  Yes [provider]  Melatonin 3 MG TABS Take 6 mg by mouth at bedtime.   Yes [provider]    methocarbamol (ROBAXIN) 500 MG tablet Take 500 mg by mouth 2 (two) times daily.   Yes [provider]  metoCLOPramide (REGLAN) 10 MG tablet Take 1 tablet (10 mg total) by mouth 3 (three) times daily before meals. Patient taking differently: Take 10 mg by mouth 4 (four) times daily -  before meals and at bedtime.  08/02/17  Yes Rai, Ripudeep K, MD  metoprolol succinate (TOPROL-XL) 25 MG 24 hr tablet Take 1 tablet (25 mg total) by mouth daily. 08/02/17  Yes Rai, Ripudeep K, MD  Multiple Vitamin (MULTIVITAMIN WITH MINERALS) TABS tablet Take 1 tablet by mouth daily.   Yes [provider]  nitroGLYCERIN (NITROSTAT) 0.4 MG SL tablet Place 0.4 mg under the tongue every 5 (five) minutes as needed for chest pain.   Yes [provider]  ondansetron (ZOFRAN-ODT) 4 MG disintegrating tablet Take 4 mg by mouth every 6 (six) hours as needed for nausea.  Yes [provider]  pantoprazole (PROTONIX) 40 MG tablet Take 1 tablet (40 mg total) by mouth 2 (two) times daily. Patient taking differently: Take 40 mg by mouth daily.  08/02/17  Yes Rai, Ripudeep K, MD  ALPRAZolam (XANAX) 0.25 MG tablet Take 0.25 mg by mouth 2 (two) times daily as needed for anxiety.     [provider]  dicyclomine (BENTYL) 10 MG capsule Take 1 capsule (10 mg total) by mouth 4 (four) times daily. Patient not taking: Reported on 11/28/2017 08/02/17   Rai, Vernelle Emerald, MD  hyoscyamine (LEVSIN SL) 0.125 MG SL tablet Place 2 tablets (0.25 mg total) under the tongue every 6 (six) hours as needed for cramping (abdominal pain.). Patient not taking: Reported on 11/28/2017 08/02/17   [name], MD  Magnesium Oxide 400 (240 Mg) MG TABS Take 1 tablet (400 mg total) by mouth daily. Patient not taking: Reported on 11/28/2017 08/02/17   Rai, Vernelle Emerald, MD  potassium chloride (K-DUR) 10 MEQ tablet Take 2 tablets (20 mEq total) by mouth daily for 7 days. Patient not taking: Reported on 11/28/2017 08/02/17 08/09/17   Rai, Vernelle Emerald, MD  sucralfate (CARAFATE) 1 g tablet Take 1 tablet (1 g total) by mouth 4 (four) times daily -  with meals and at bedtime. Patient not taking: Reported on 11/28/2017 08/02/17   [name], MD    Physical Exam:  Constitutional: Obese female currently in NAD, calm, comfortable Vitals:   11/28/17 1615 11/28/17 1645 11/28/17 1700 11/28/17 1900  BP:   (!) 158/97 137/76  Pulse: 95 94 (!) 101 94  Resp: 19 18 (!) 23 18  Temp:      TempSrc:      SpO2: 97% 98% 100% 100%  Weight:      Height:       Eyes: PERRL, lids and conjunctivae normal ENMT: Mucous membranes are dry. Posterior pharynx clear of any exudate or lesions.   Neck: normal, supple, no masses, no thyromegaly Respiratory: clear to auscultation bilaterally, no wheezing, no crackles. Normal respiratory effort. No accessory muscle use.  Cardiovascular: Regular rate and rhythm, no murmurs / rubs / gallops. No extremity edema. 2+ pedal pulses. No carotid bruits.  Tenderness palpation of the chest. Abdomen: Lower abdominal tenderness to palpation, no masses palpated. No hepatosplenomegaly. Bowel sounds positive.  Musculoskeletal: no clubbing / cyanosis. No joint deformity upper and lower extremities. Good ROM, no contractures. Normal muscle tone.  Skin: no rashes, lesions, ulcers. No induration Neurologic: CN 2-12 grossly intact. Sensation intact, DTR normal. Strength 5/5 in all 4.  Psychiatric: Normal judgment and insight. Alert and oriented x 3. Normal mood.     Labs on Admission: I have personally reviewed following labs and imaging studies  CBC: Recent Labs  Lab 11/28/17 1530  WBC 10.8*  HGB 10.9*  HCT 33.9*  MCV 89.0  PLT 159*   Basic Metabolic Panel: Recent Labs  Lab 11/28/17 1530  NA 140  K 4.1  CL 106  CO2 23  GLUCOSE 245*  BUN 24*  CREATININE 1.24*  CALCIUM 9.8   GFR: Estimated Creatinine Clearance: 76.6 mL/min (A) (by C-G formula based on SCr of 1.24 mg/dL (H)). Liver Function  Tests: Recent Labs  Lab 11/28/17 1530  AST 20  ALT 20  ALKPHOS 91  BILITOT 0.7  PROT 8.2*  ALBUMIN 3.6   Recent Labs  Lab 11/28/17 1530  LIPASE 35   No results for input(s): AMMONIA in the last 168 hours. Coagulation  Profile: No results for input(s): INR, PROTIME in the last 168 hours. Cardiac Enzymes: No results for input(s): CKTOTAL, CKMB, CKMBINDEX, TROPONINI in the last 168 hours. BNP (last 3 results) No results for input(s): PROBNP in the last 8760 hours. HbA1C: No results for input(s): HGBA1C in the last 72 hours. CBG: No results for input(s): GLUCAP in the last 168 hours. Lipid Profile: No results for input(s): CHOL, HDL, LDLCALC, TRIG, CHOLHDL, LDLDIRECT in the last 72 hours. Thyroid Function Tests: No results for input(s): TSH, T4TOTAL, FREET4, T3FREE, THYROIDAB in the last 72 hours. Anemia Panel: No results for input(s): VITAMINB12, FOLATE, FERRITIN, TIBC, IRON, RETICCTPCT in the last 72 hours. Urine analysis:    Component Value Date/Time   COLORURINE YELLOW 11/28/2017 1530   APPEARANCEUR CLEAR 11/28/2017 1530   LABSPEC 1.016 11/28/2017 1530   PHURINE 5.0 11/28/2017 1530   GLUCOSEU 50 (A) 11/28/2017 1530   HGBUR MODERATE (A) 11/28/2017 1530   BILIRUBINUR NEGATIVE 11/28/2017 1530   KETONESUR NEGATIVE 11/28/2017 1530   PROTEINUR 100 (A) 11/28/2017 1530   UROBILINOGEN 0.2 10/02/2013 2108   NITRITE NEGATIVE 11/28/2017 1530   LEUKOCYTESUR NEGATIVE 11/28/2017 1530   Sepsis Labs: No results found for this or any previous visit (from the past 240 hour(s)).   Radiological Exams on Admission: Ct Abdomen Pelvis W Contrast  Result Date: 11/28/2017 CLINICAL DATA:  52 year old with abdominal pain. EXAM: CT ABDOMEN AND PELVIS WITH CONTRAST TECHNIQUE: Multidetector CT imaging of the abdomen and pelvis was performed using the standard protocol following bolus administration of intravenous contrast. CONTRAST:  158m ISOVUE-300 IOPAMIDOL (ISOVUE-300) INJECTION 61%  COMPARISON:  Chest CT 04/20/2016 FINDINGS: Lower chest: Lung bases are clear. Hepatobiliary: Cholecystectomy. Normal appearance of the liver. Portal venous system is patent. No suspicious liver lesions. No biliary dilatation. Pancreas: Unremarkable. No pancreatic ductal dilatation or surrounding inflammatory changes. Spleen: Normal in size without focal abnormality. Adrenals/Urinary Tract: Normal adrenals. Urinary bladder is unremarkable. No hydronephrosis and no suspicious renal lesions. However, there is no evidence for contrast excretion on the 2 minute delayed renal images. Stomach/Bowel: Normal appearance of the stomach and duodenum. Normal appearance of small bowel without obstruction. No acute abnormality to the colon. There is a normal appendix. Vascular/Lymphatic: Aortic calcifications without aneurysm. No significant lymph node enlargement in the abdomen or pelvis. Reproductive: There is a rounded solid structure in the left adnexal region that measures 4.3 x 4.6 x 5.2 cm. Structure has similar density to the adjacent uterus on this post contrast examination. Normal appearance of the right ovary/adnexa region. Other: No free fluid.  Negative for free air. Musculoskeletal: Degenerative facet disease in lower lumbar spine. Disc space narrowing and large anterior bridging osteophytes at L5-S1. IMPRESSION: 1. Indeterminate 5.2 cm solid structure in the left adnexa region. Differential diagnosis includes a left ovarian/adnexal mass versus a pedunculated uterine fibroid. Recommend further characterization with a pelvic ultrasound. 2. No excretion of iodinated contrast on the 2 minute delayed renal images. Findings can be associated with renal dysfunction and recommend follow-up metabolic panel to assess kidney function. Electronically Signed   By: AMarkus DaftM.D.   On: 11/28/2017 22:22   Dg Chest Port 1 View  Result Date: 11/28/2017 CLINICAL DATA:  CPR. EXAM: PORTABLE CHEST 1 VIEW COMPARISON:  None.  FINDINGS: The heart size and mediastinal contours are within normal limits. Both lungs are clear. The visualized skeletal structures are unremarkable. IMPRESSION: Clear lungs. Electronically Signed   By: KUlyses JarredM.D.   On: 11/28/2017 22:46    EKG:  Independently reviewed.  Sinus tachycardia 121 bpm with QTc 496  Assessment/Plan Allergic reaction to contrast media, suspected Cardiac arrest s/p CPR: Acute.  Patient reportedly had allergic reaction to IV contrast given for CT scan of the abdomen and pelvis with loss of pulse.  Received about 1 minute of CPR with epinephrine given.  Patient had been given Solu-Medrol, Benadryl, and Pepcid in the ED. - Admit to a stepdown unit - Continuous pulse oximetry overnight with nasal cannula oxygen as needed - Trend troponins overnight - Monitor for recurrence of allergic reaction  Lower abdominal pain with nausea vomiting, history of gastroparesis: Acute.  Patient presented with lower abdominal pain with nausea and vomiting.  Symptoms initially thought to be related with gastroparesis. - Continue Reglan as tolerated   Renal insufficiency: Acute.  Initial creatinine noted to be 1.24 with BUN 24.  With recent nausea and vomiting suspect some aspect of dehydration. - Normal saline IV fluids at 100 mL/h overnight - Recheck creatinine in a.m.  Pelvic mass: Acute.  Initial CT scan revealed a 5.2 solid structure of the left adnexal region concerning for left ovarian/adnexal mass versus pedunculated uterine fibroid.  Pelvic mass be PE because of lower abdominal pain symptoms. - Check pelvic ultrasound in a.m.  Leukocytosis: Chronic.  WBC elevated 10.8 on admission.  Review of records shows persistent elevation of patient's white blood cell counts. - May warrant further investigation  Normocytic normochromic anemia: Chronic.  Hemoglobin 10.9 on admission, but appears to be around patient's baseline. - Continue to monitor  Diabetes mellitus type 2: Patient  presents with initial blood glucose of 245 admission.  Last hemoglobin A1c noted to be 8.5 in 07/2017. - Hypoglycemia protocol - Continue home regimen of Lantus - CBGs for meals with moderate SSI  Essential hypertension - Continue metoprolol and amlodipine  Anxiety - Continue Cymbalta and Xanax prn sliding  Hyperlipidemia - Continue atorvastatin   DVT prophylaxis: Lovenox Code Status: Full Family Communication: No family present at bedside Disposition Plan: Likely discharge back to Consults called: None Admission status: Observation  Norval Morton MD [facility] Pager [phone]   If 7PM-7AM, please contact night-coverage [email or web address] Password [email or web address]  11/28/2017, 11:13 PM

## 2017-11-28 NOTE — ED Notes (Signed)
Pt assisted up to bedside.

## 2017-11-28 NOTE — Discharge Instructions (Signed)
UROLOGY

## 2017-11-28 NOTE — ED Triage Notes (Signed)
Patient is from [facility] and transport by [facility]. Patient is complaining of lower abd pain and vomiting. Symptoms started yesterday around 18:30 but got worse today. Patient last vomited today at 9:30.

## 2017-11-28 NOTE — ED Notes (Signed)
Patient used bedside commode.  

## 2017-11-28 NOTE — ED Notes (Signed)
Pt states she is unable to void at this time. PT requesting bedside toilet.

## 2017-11-28 NOTE — ED Notes (Signed)
Pt made aware urine needed. Pt unable to void at this time.

## 2017-11-29 ENCOUNTER — Inpatient Hospital Stay (HOSPITAL_COMMUNITY): Payer: Medicaid Other

## 2017-11-29 ENCOUNTER — Observation Stay (HOSPITAL_COMMUNITY): Payer: Medicaid Other

## 2017-11-29 ENCOUNTER — Other Ambulatory Visit: Payer: Self-pay

## 2017-11-29 DIAGNOSIS — D638 Anemia in other chronic diseases classified elsewhere: Secondary | ICD-10-CM | POA: Diagnosis present

## 2017-11-29 DIAGNOSIS — Y92230 Patient room in hospital as the place of occurrence of the external cause: Secondary | ICD-10-CM | POA: Diagnosis present

## 2017-11-29 DIAGNOSIS — Z794 Long term (current) use of insulin: Secondary | ICD-10-CM | POA: Diagnosis not present

## 2017-11-29 DIAGNOSIS — R1900 Intra-abdominal and pelvic swelling, mass and lump, unspecified site: Secondary | ICD-10-CM | POA: Diagnosis present

## 2017-11-29 DIAGNOSIS — N179 Acute kidney failure, unspecified: Secondary | ICD-10-CM | POA: Diagnosis present

## 2017-11-29 DIAGNOSIS — Z8701 Personal history of pneumonia (recurrent): Secondary | ICD-10-CM | POA: Diagnosis not present

## 2017-11-29 DIAGNOSIS — I468 Cardiac arrest due to other underlying condition: Secondary | ICD-10-CM | POA: Diagnosis present

## 2017-11-29 DIAGNOSIS — M797 Fibromyalgia: Secondary | ICD-10-CM | POA: Diagnosis present

## 2017-11-29 DIAGNOSIS — Y92238 Other place in hospital as the place of occurrence of the external cause: Secondary | ICD-10-CM | POA: Diagnosis present

## 2017-11-29 DIAGNOSIS — K219 Gastro-esophageal reflux disease without esophagitis: Secondary | ICD-10-CM | POA: Diagnosis present

## 2017-11-29 DIAGNOSIS — T508X5A Adverse effect of diagnostic agents, initial encounter: Secondary | ICD-10-CM | POA: Diagnosis present

## 2017-11-29 DIAGNOSIS — I469 Cardiac arrest, cause unspecified: Secondary | ICD-10-CM | POA: Diagnosis present

## 2017-11-29 DIAGNOSIS — Z6841 Body Mass Index (BMI) 40.0 and over, adult: Secondary | ICD-10-CM | POA: Diagnosis not present

## 2017-11-29 DIAGNOSIS — K3184 Gastroparesis: Secondary | ICD-10-CM | POA: Diagnosis present

## 2017-11-29 DIAGNOSIS — Z8711 Personal history of peptic ulcer disease: Secondary | ICD-10-CM | POA: Diagnosis not present

## 2017-11-29 DIAGNOSIS — M109 Gout, unspecified: Secondary | ICD-10-CM | POA: Diagnosis present

## 2017-11-29 DIAGNOSIS — R55 Syncope and collapse: Secondary | ICD-10-CM | POA: Diagnosis present

## 2017-11-29 DIAGNOSIS — E785 Hyperlipidemia, unspecified: Secondary | ICD-10-CM | POA: Diagnosis present

## 2017-11-29 DIAGNOSIS — Z8673 Personal history of transient ischemic attack (TIA), and cerebral infarction without residual deficits: Secondary | ICD-10-CM | POA: Diagnosis not present

## 2017-11-29 DIAGNOSIS — I361 Nonrheumatic tricuspid (valve) insufficiency: Secondary | ICD-10-CM | POA: Diagnosis not present

## 2017-11-29 DIAGNOSIS — D72829 Elevated white blood cell count, unspecified: Secondary | ICD-10-CM | POA: Diagnosis present

## 2017-11-29 DIAGNOSIS — J189 Pneumonia, unspecified organism: Secondary | ICD-10-CM | POA: Diagnosis not present

## 2017-11-29 DIAGNOSIS — Z79899 Other long term (current) drug therapy: Secondary | ICD-10-CM | POA: Diagnosis not present

## 2017-11-29 DIAGNOSIS — Z9849 Cataract extraction status, unspecified eye: Secondary | ICD-10-CM | POA: Diagnosis not present

## 2017-11-29 DIAGNOSIS — Z86711 Personal history of pulmonary embolism: Secondary | ICD-10-CM | POA: Diagnosis not present

## 2017-11-29 DIAGNOSIS — E1143 Type 2 diabetes mellitus with diabetic autonomic (poly)neuropathy: Secondary | ICD-10-CM | POA: Diagnosis present

## 2017-11-29 DIAGNOSIS — F419 Anxiety disorder, unspecified: Secondary | ICD-10-CM | POA: Diagnosis present

## 2017-11-29 DIAGNOSIS — I10 Essential (primary) hypertension: Secondary | ICD-10-CM | POA: Diagnosis present

## 2017-11-29 LAB — BASIC METABOLIC PANEL
Anion gap: 14 (ref 5–15)
BUN: 27 mg/dL — AB (ref 6–20)
CO2: 19 mmol/L — ABNORMAL LOW (ref 22–32)
Calcium: 8.9 mg/dL (ref 8.9–10.3)
Chloride: 103 mmol/L (ref 98–111)
Creatinine, Ser: 1.46 mg/dL — ABNORMAL HIGH (ref 0.44–1.00)
GFR calc Af Amer: 46 mL/min — ABNORMAL LOW (ref >60)
GFR, EST NON AFRICAN AMERICAN: 40 mL/min — AB (ref >60)
GLUCOSE: 435 mg/dL — AB (ref 70–99)
Potassium: 5.3 mmol/L — ABNORMAL HIGH (ref 3.5–5.1)
Sodium: 136 mmol/L (ref 135–145)

## 2017-11-29 LAB — CBC
HCT: 33.8 % — ABNORMAL LOW (ref 36.0–46.0)
Hemoglobin: 11 g/dL — ABNORMAL LOW (ref 12.0–15.0)
MCH: 29.3 pg (ref 26.0–34.0)
MCHC: 32.5 g/dL (ref 30.0–36.0)
MCV: 89.9 fL (ref 78.0–100.0)
Platelets: 402 10*3/uL — ABNORMAL HIGH (ref 150–400)
RBC: 3.76 MIL/uL — AB (ref 3.87–5.11)
RDW: 13.7 % (ref 11.5–15.5)
WBC: 26.5 10*3/uL — ABNORMAL HIGH (ref 4.0–10.5)

## 2017-11-29 LAB — GLUCOSE, CAPILLARY
GLUCOSE-CAPILLARY: 389 mg/dL — AB (ref 70–99)
GLUCOSE-CAPILLARY: 336 mg/dL — AB (ref 70–99)
Glucose-Capillary: 434 mg/dL — ABNORMAL HIGH (ref 70–99)
Glucose-Capillary: 277 mg/dL — ABNORMAL HIGH (ref 70–99)

## 2017-11-29 LAB — MRSA PCR SCREENING: MRSA BY PCR: NEGATIVE

## 2017-11-29 LAB — MAGNESIUM: Magnesium: 1.9 mg/dL (ref 1.7–2.4)

## 2017-11-29 LAB — TROPONIN I
Troponin I: 0.06 ng/mL (ref <0.03)
Troponin I: 0.07 ng/mL (ref <0.03)

## 2017-11-29 LAB — GLUCOSE, RANDOM: Glucose, Bld: 470 mg/dL — ABNORMAL HIGH (ref 70–99)

## 2017-11-29 MED ORDER — ONDANSETRON HCL 4 MG/2ML IJ SOLN
4.0000 mg | Freq: Four times a day (QID) | INTRAMUSCULAR | Status: DC | PRN
  Administered 2017-11-29: 4 mg via INTRAVENOUS
  Filled 2017-11-29: qty 2

## 2017-11-29 MED ORDER — HYDROCODONE-ACETAMINOPHEN 5-325 MG PO TABS
2.0000 | ORAL_TABLET | Freq: Once | ORAL | Status: AC
  Administered 2017-11-29: 2 via ORAL
  Filled 2017-11-29: qty 2

## 2017-11-29 MED ORDER — MORPHINE SULFATE (PF) 2 MG/ML IV SOLN
1.0000 mg | Freq: Once | INTRAVENOUS | Status: AC
  Administered 2017-11-29: 1 mg via INTRAVENOUS
  Filled 2017-11-29: qty 1

## 2017-11-29 MED ORDER — ORAL CARE MOUTH RINSE
15.0000 mL | Freq: Two times a day (BID) | OROMUCOSAL | Status: DC
  Administered 2017-11-29 – 2017-12-02 (×5): 15 mL via OROMUCOSAL

## 2017-11-29 MED ORDER — INSULIN ASPART 100 UNIT/ML ~~LOC~~ SOLN
18.0000 [IU] | Freq: Once | SUBCUTANEOUS | Status: AC
  Administered 2017-11-29: 18 [IU] via SUBCUTANEOUS

## 2017-11-29 NOTE — Progress Notes (Signed)
Inpatient Diabetes Program Recommendations  AACE/ADA: New Consensus Statement on Inpatient Glycemic Control (2015)  Target Ranges:  Prepandial:   less than 140 mg/dL      Peak postprandial:   less than 180 mg/dL (1-2 hours)      Critically ill patients:  140 - 180 mg/dL   Lab Results  Component Value Date   GLUCAP 434 (H) 11/29/2017   HGBA1C 8.5 (H) 07/30/2017   Review of Glycemic Control  Diabetes history: DM 2 Outpatient Diabetes medications: Lantus 40 units, Humalog 10 units tid with meals Current orders for Inpatient glycemic control: Lantus 40 units qhs, Novolog 0-15 units tid  Inpatient Diabetes Program Recommendations:    Glucose 434 this am after 125 IV Solumedrol and probably epinephrine. Could consider ordering half of her meal coverage when eating at least 50% of meals, Novolog 5 units tid.  Will watch for now as there are no more steroids ordered.  Thanks,  Tama Headings RN, MSN, BC-ADM Inpatient Diabetes Coordinator Team Pager [phone] (8a-5p)

## 2017-11-29 NOTE — Progress Notes (Signed)
Pt refused MRI contrast for MRI Pelvis. Exam done without contrast.  Lynelle Doctor Rt,R,MR

## 2017-11-29 NOTE — Plan of Care (Signed)
Pt A&O X4.  Pt comfortable and resting now after being given antiemetic and pain meds.

## 2017-11-29 NOTE — ED Notes (Signed)
ED TO INPATIENT HANDOFF REPORT  Name/Age/Gender Deborah [name] 52 y.o. female  Code Status    Code Status Orders  (From admission, onward)         Start     Ordered   11/28/17 2338  Full code  Continuous     11/28/17 2341        Code Status History    Date Active Date Inactive Code Status Order ID Comments User Context   07/29/2017 1655 08/02/2017 2221 Full Code [id number]  Elease Hashimoto ED   03/19/2017 2138 03/22/2017 1951 Full Code [id number]  Ivor Costa, MD ED   08/16/2016 0504 08/23/2016 1512 Full Code [id number]  Reubin Milan, MD ED   07/04/2016 2225 07/06/2016 1839 Full Code [id number]  Danford, Suann Larry, MD Inpatient   06/17/2016 2245 06/19/2016 1815 Full Code [id number]  Vianne Bulls, MD ED   06/05/2016 1725 06/08/2016 1845 Full Code [id number]  Reubin Milan, MD Inpatient   06/05/2016 1710 06/05/2016 1725 Full Code [id number]  Reubin Milan, MD Inpatient   04/23/2016 2315 04/29/2016 2026 Full Code [id number]  Edwin Dada, MD Inpatient   02/29/2016 0311 03/02/2016 2005 Full Code [id number]  Toy Baker, MD ED   12/19/2015 0630 12/20/2015 2203 Full Code [id number]  Reubin Milan, MD ED   12/19/2015 0630 12/19/2015 0630 Full Code [id number]  Reubin Milan, MD ED   12/06/2015 2132 12/10/2015 1556 Full Code [id number]  Lily Kocher, MD Inpatient   09/26/2015 1639 09/28/2015 1502 Full Code [id number]  Verlee Monte, MD Inpatient   09/21/2015 1444 09/23/2015 2132 Full Code [id number]  Florencia Reasons, MD Inpatient   08/20/2015 1556 08/24/2015 2108 Full Code [id number]  Bonnell Public, MD ED   05/25/2015 2141 05/31/2015 1501 Full Code [id number]  Robbie Lis, MD Inpatient      Home/SNF/Other Nursing Home  Chief Complaint abdominal pain  Level of Care/Admitting Diagnosis ED Disposition    ED Disposition Condition [place]: [facility] [[id number]]  Level of Care: Stepdown [14]  Admit to SDU based  on following criteria: Respiratory Distress:  Frequent assessment and/or intervention to maintain adequate ventilation/respiration, pulmonary toilet, and respiratory treatment.  Admit to SDU based on following criteria: Cardiac Instability:  Patients experiencing chest pain, unconfirmed MI and stable, arrhythmias and CHF requiring medical management and potentially compromising patient's stability  Diagnosis: Allergic reaction to contrast media, initial encounter [811572]  Admitting Physician: Norval Morton [[id number]]  Attending Physician: Norval Morton [[id number]]  PT Class (Do Not Modify): Observation [104]  PT Acc Code (Do Not Modify): Observation [10022]       Medical History Past Medical History:  Diagnosis Date  . Acute back pain with sciatica, left   . Acute back pain with sciatica, right   . Anemia, unspecified   . Chest pain 12/2015  . Chronic pain   . Diabetes mellitus   . Esophageal reflux   . Fibromyalgia   . Gastric ulcer   . Gastroparesis   . Gout   . Hyperlipidemia   . Hypertension   . Lumbosacral stenosis   . Obesity   . Pneumonia   . [name]   . Stroke ([place]) 02/2011  . Vitamin B12 deficiency anemia     Allergies Allergies  Allergen Reactions  . Contrast Media [Iodinated Diagnostic Agents] Anaphylaxis    Cardiac arrest  . Diazepam [name]  . Isovue [Iopamidol] Anaphylaxis  Patient had seizure like activity and then code post 100 cc of isovue 300  . Lisinopril Anaphylaxis    Tongue and mouth swelling  . Penicillins Palpitations    Has patient had a PCN reaction causing immediate rash, facial/tongue/throat swelling, SOB or lightheadedness with hypotension: Yes, heart races Has patient had a PCN reaction causing severe rash involving mucus membranes or skin necrosis: No Has patient had a PCN reaction that required hospitalization: Yes  Has patient had a PCN reaction occurring within the last 10 years: No   . Acetaminophen  Nausea Only and Other (See Comments)    Irritates stomach ulcer  Abdominal pain  . Tolmetin Nausea Only    Other reaction(s): Other (See Comments) ULCER  . Aspirin Other (See Comments)    Irritates stomach ulcer   . Food Hives and Swelling    Carrots, ketchup   . Nsaids Other (See Comments)    ULCER  . Tramadol Nausea And Vomiting    IV Location/Drains/Wounds Patient Lines/Drains/Airways Status   Active Line/Drains/Airways    Name:   Placement date:   Placement time:   Site:   Days:   Peripheral IV 11/28/17 Right Antecubital   11/28/17    1530    Antecubital   1   Incision (Closed) 06/18/16 Leg Right;Upper   06/18/16    0022     529          Labs/Imaging Results for orders placed or performed during the hospital encounter of 11/28/17 (from the past 48 hour(s))  Lipase, blood     Status: None   Collection Time: 11/28/17  3:30 PM  Result Value Ref Range   Lipase 35 11 - 51 U/L    Comment: Performed at [facility], [place] [address], [place], [place] [address]  Comprehensive metabolic panel     Status: Abnormal   Collection Time: 11/28/17  3:30 PM  Result Value Ref Range   Sodium 140 135 - 145 mmol/L   Potassium 4.1 3.5 - 5.1 mmol/L   Chloride 106 98 - 111 mmol/L   CO2 23 22 - 32 mmol/L   Glucose, Bld 245 (H) 70 - 99 mg/dL   BUN 24 (H) 6 - 20 mg/dL   Creatinine, Ser 1.24 (H) 0.44 - 1.00 mg/dL   Calcium 9.8 8.9 - 10.3 mg/dL   Total Protein 8.2 (H) 6.5 - 8.1 g/dL   Albumin 3.6 3.5 - 5.0 g/dL   AST 20 15 - 41 U/L   ALT 20 0 - 44 U/L   Alkaline Phosphatase 91 38 - 126 U/L   Total Bilirubin 0.7 0.3 - 1.2 mg/dL   GFR calc non Af Amer 49 (L) >60 mL/min   GFR calc Af Amer 56 (L) >60 mL/min    Comment: (NOTE) The eGFR has been calculated using the CKD EPI equation. This calculation has not been validated in all clinical situations. eGFR's persistently <60 mL/min signify possible Chronic Kidney Disease.    Anion gap 11 5 - 15    Comment: Performed at  [facility], [place] [address], [place], [place] [address]  CBC     Status: Abnormal   Collection Time: 11/28/17  3:30 PM  Result Value Ref Range   WBC 10.8 (H) 4.0 - 10.5 K/uL   RBC 3.81 (L) 3.87 - 5.11 MIL/uL   Hemoglobin 10.9 (L) 12.0 - 15.0 g/dL   HCT 33.9 (L) 36.0 - 46.0 %   MCV 89.0 78.0 - 100.0  fL   MCH 28.6 26.0 - 34.0 pg   MCHC 32.2 30.0 - 36.0 g/dL   RDW 13.3 11.5 - 15.5 %   Platelets 444 (H) 150 - 400 K/uL    Comment: Performed at [facility], [place] [address], [place], [place] [address]  Urinalysis, Routine w reflex microscopic     Status: Abnormal   Collection Time: 11/28/17  3:30 PM  Result Value Ref Range   Color, Urine YELLOW YELLOW   APPearance CLEAR CLEAR   Specific Gravity, Urine 1.016 1.005 - 1.030   pH 5.0 5.0 - 8.0   Glucose, UA 50 (A) NEGATIVE mg/dL   Hgb urine dipstick MODERATE (A) NEGATIVE   Bilirubin Urine NEGATIVE NEGATIVE   Ketones, ur NEGATIVE NEGATIVE mg/dL   Protein, ur 100 (A) NEGATIVE mg/dL   Nitrite NEGATIVE NEGATIVE   Leukocytes, UA NEGATIVE NEGATIVE   RBC / HPF 6-10 0 - 5 RBC/hpf   WBC, UA 0-5 0 - 5 WBC/hpf   Bacteria, UA RARE (A) NONE SEEN   Squamous Epithelial / LPF 0-5 0 - 5   Mucus PRESENT     Comment: Performed at [facility], [place] [address] [place], [place] [address]  I-stat troponin, ED     Status: None   Collection Time: 11/28/17  3:31 PM  Result Value Ref Range   Troponin i, poc 0.00 0.00 - 0.08 ng/mL   Comment 3            Comment: Due to the release kinetics of cTnI, a negative result within the first hours of the onset of symptoms does not rule out myocardial infarction with certainty. If myocardial infarction is still suspected, repeat the test at appropriate intervals.   CBG monitoring, ED     Status: Abnormal   Collection Time: 11/28/17 11:11 PM  Result Value Ref Range   Glucose-Capillary 244 (H) 70 - 99 mg/dL   Ct Abdomen Pelvis W Contrast  Result Date:  11/28/2017 CLINICAL DATA:  52 year old with abdominal pain. EXAM: CT ABDOMEN AND PELVIS WITH CONTRAST TECHNIQUE: Multidetector CT imaging of the abdomen and pelvis was performed using the standard protocol following bolus administration of intravenous contrast. CONTRAST:  111m ISOVUE-300 IOPAMIDOL (ISOVUE-300) INJECTION 61% COMPARISON:  Chest CT 04/20/2016 FINDINGS: Lower chest: Lung bases are clear. Hepatobiliary: Cholecystectomy. Normal appearance of the liver. Portal venous system is patent. No suspicious liver lesions. No biliary dilatation. Pancreas: Unremarkable. No pancreatic ductal dilatation or surrounding inflammatory changes. Spleen: Normal in size without focal abnormality. Adrenals/Urinary Tract: Normal adrenals. Urinary bladder is unremarkable. No hydronephrosis and no suspicious renal lesions. However, there is no evidence for contrast excretion on the 2 minute delayed renal images. Stomach/Bowel: Normal appearance of the stomach and duodenum. Normal appearance of small bowel without obstruction. No acute abnormality to the colon. There is a normal appendix. Vascular/Lymphatic: Aortic calcifications without aneurysm. No significant lymph node enlargement in the abdomen or pelvis. Reproductive: There is a rounded solid structure in the left adnexal region that measures 4.3 x 4.6 x 5.2 cm. Structure has similar density to the adjacent uterus on this post contrast examination. Normal appearance of the right ovary/adnexa region. Other: No free fluid.  Negative for free air. Musculoskeletal: Degenerative facet disease in lower lumbar spine. Disc space narrowing and large anterior bridging osteophytes at L5-S1. IMPRESSION: 1. Indeterminate 5.2 cm solid structure in the left adnexa region. Differential diagnosis includes a left ovarian/adnexal mass versus a pedunculated uterine fibroid. Recommend further characterization with a  pelvic ultrasound. 2. No excretion of iodinated contrast on the 2 minute delayed  renal images. Findings can be associated with renal dysfunction and recommend follow-up metabolic panel to assess kidney function. Electronically Signed   By: Markus Daft M.D.   On: 11/28/2017 22:22   Dg Chest Port 1 View  Result Date: 11/28/2017 CLINICAL DATA:  CPR. EXAM: PORTABLE CHEST 1 VIEW COMPARISON:  None. FINDINGS: The heart size and mediastinal contours are within normal limits. Both lungs are clear. The visualized skeletal structures are unremarkable. IMPRESSION: Clear lungs. Electronically Signed   By: Ulyses Jarred M.D.   On: 11/28/2017 22:46    Pending Labs Unresulted Labs (From admission, onward)    Start     Ordered   11/29/17 0500  CBC  Tomorrow morning,   R     11/28/17 2341   11/29/17 6962  Basic metabolic panel  Tomorrow morning,   R     11/28/17 2341   11/29/17 0500  Magnesium  Tomorrow morning,   R     11/28/17 2341   11/28/17 2341  Troponin I  Now then every 6 hours,   R     11/28/17 2341          Vitals/Pain Today's Vitals   11/28/17 1700 11/28/17 1833 11/28/17 1900 11/28/17 2332  BP: (!) 158/97  137/76 114/64  Pulse: (!) 101  94 95  Resp: (!) 23  18 (!) 24  Temp:      TempSrc:      SpO2: 100%  100% 92%  Weight:      Height:      PainSc: 6  7       Isolation Precautions No active isolations  Medications Medications  EPINEPHrine (ADRENALIN) 1 MG/10ML injection (has no administration in time range)  insulin glargine (LANTUS) injection 40 Units (has no administration in time range)  metoCLOPramide (REGLAN) tablet 10 mg (has no administration in time range)  pantoprazole (PROTONIX) EC tablet 40 mg (has no administration in time range)  Melatonin TABS 6 mg (has no administration in time range)  loratadine (CLARITIN) tablet 10 mg (has no administration in time range)  ALPRAZolam (XANAX) tablet 0.25 mg (has no administration in time range)  DULoxetine (CYMBALTA) DR capsule 30 mg (has no administration in time range)  metoprolol succinate (TOPROL-XL) 24  hr tablet 25 mg (has no administration in time range)  atorvastatin (LIPITOR) tablet 10 mg (has no administration in time range)  amLODipine (NORVASC) tablet 10 mg (has no administration in time range)  enoxaparin (LOVENOX) injection 40 mg (has no administration in time range)  0.9 %  sodium chloride infusion (has no administration in time range)  insulin aspart (novoLOG) injection 0-15 Units (has no administration in time range)  magnesium sulfate IVPB 2 g 50 mL (has no administration in time range)  sodium chloride 0.9 % bolus 500 mL (0 mLs Intravenous Stopped 11/28/17 1713)  morphine 4 MG/ML injection 4 mg (4 mg Intravenous Given 11/28/17 1534)  metoCLOPramide (REGLAN) injection 10 mg (10 mg Intravenous Given 11/28/17 1532)  morphine 4 MG/ML injection 4 mg (4 mg Intravenous Given 11/28/17 1832)  iopamidol (ISOVUE-300) 61 % injection 100 mL (100 mLs Intravenous Contrast Given 11/28/17 2139)  diphenhydrAMINE (BENADRYL) injection 25 mg (25 mg Intravenous Given 11/28/17 2301)  methylPREDNISolone sodium succinate (SOLU-MEDROL) 125 mg/2 mL injection 125 mg (125 mg Intravenous Given 11/28/17 2300)  famotidine (PEPCID) IVPB 20 mg premix (0 mg Intravenous Stopped 11/28/17 2351)    Mobility  walks

## 2017-11-29 NOTE — ED Notes (Addendum)
[place] at bedside, unable to transport at this time

## 2017-11-29 NOTE — Progress Notes (Signed)
PROGRESS NOTE    Deborah [name]  NLG:[id number] DOB: 04/18/1965 DOA: 11/28/2017 PCP: Patient, No Pcp Per   Brief Narrative:  52 year old with history of essential hypertension, hyperlipidemia, insulin-dependent diabetes mellitus type 2 with gastroparesis came to the hospital with complaints of abdominal pain nausea and vomiting.  She has been on p.o. Reglan without any relief.  Her initial labs are unremarkable however CT of the abdomen pelvis were ordered which showed 5.2 cm left adnexal solid structure.  But sometime after IV contrast administration patient reportedly lost her pulse and ended up getting CPR along with epinephrine for 1 minute.  Subsequently return of circulation.   Assessment & Plan:   Principal Problem:   Allergic reaction to contrast media, initial encounter Active Problems:   Lower abdominal pain   Normocytic normochromic anemia   Cardiac arrest (HCC)   Pelvic mass   Leukocytosis   Anxiety  Cardiac arrest with concerns of possible allergic reaction versus vasovagal episode - Unsure if patient lost pulse due to IV contrast.  Patient was given Solu-Medrol, Benadryl and Pepcid.  At this time she has no complaints and hemodynamically stable.  Vital signs are stable as well. -Previously she has had evaluation by allergy/immunology and was told is allergic to multiple things.  At this time contrast allergy has been added to her list.  Would recommend discharging her on epinephrine pen and following up outpatient with allergy/immunology. - We will check echocardiogram, TSH. -Provide supportive care. - Hemodynamically stable for 12 hours therefore will transfer out of the stepdown unit and monitored on telemetry for another 24 hours.  Lower abdominal pain with nausea History of gastroparesis -CT of the abdomen pelvis is negative for any acute pathology but shows adnexal mass.  Lipase and LFTs are normal.  Left-sided adnexal mass -Pelvic ultrasound concerns for  dermoid versus endometrioma, recommend MRI.  Ordered MRI pelvis.  Depending over the MRI finding is, will need outpatient follow-up with primary care physician and GYN.  Diabetes mellitus type 2, insulin-dependent Gastroparesis due to diabetes mellitus type 2 -Continue home medications.  Insulin sliding and Lantus  Essential hypertension - Holding off on Norvasc this morning due to borderline low blood pressure but we will slowly resume metoprolol.  Anxiety -Continue Cymbalta and Xanax as necessary  Hyperlipidemia -Continue statin  DVT prophylaxis: Lovenox Code Status: Full code Family Communication: Mother at bedside Disposition Plan: Maintain hospital stay for at least 24 hours again to observe on telemetry.  Consultants:   None  Procedures:   None  Antimicrobials:  None  Subjective: No complaints, feels better.  Patient states she has been allergic to multiple things and has been evaluated by allergy immunology in the past.  She has received contrast in the past but never had similar episodes.  Review of Systems Otherwise negative except as per HPI, including: General: Denies fever, chills, night sweats or unintended weight loss. Resp: Denies cough, wheezing, shortness of breath. Cardiac: Denies chest pain, palpitations, orthopnea, paroxysmal nocturnal dyspnea. GI: Denies abdominal pain, nausea, vomiting, diarrhea or constipation GU: Denies dysuria, frequency, hesitancy or incontinence MS: Denies muscle aches, joint pain or swelling Neuro: Denies headache, neurologic deficits (focal weakness, numbness, tingling), abnormal gait Psych: Denies anxiety, depression, SI/HI/AVH Skin: Denies new rashes or lesions ID: Denies sick contacts, exotic exposures, travel  Objective: Vitals:   11/29/17 0759 11/29/17 0800 11/29/17 0909 11/29/17 1000  BP:  94/70 (!) 109/55 (!) 106/56  Pulse:  99 (!) 105 (!) 105  Resp:  14 (!)  37 (!) 21  Temp: (!) 97.3 F (36.3 C)     TempSrc:  Oral     SpO2:  98% 98% 95%  Weight:      Height:        Intake/Output Summary (Last 24 hours) at 11/29/2017 1453 Last data filed at 11/29/2017 1030 Gross per 24 hour  Intake 535.92 ml  Output 200 ml  Net 335.92 ml   Filed Weights   11/28/17 1343  Weight: (!) 142.2 kg    Examination:  General exam: Appears calm and comfortable  Respiratory system: Clear to auscultation. Respiratory effort normal. Cardiovascular system: S1 & S2 heard, RRR. No JVD, murmurs, rubs, gallops or clicks. No pedal edema. Gastrointestinal system: Abdomen is nondistended, soft and nontender. No organomegaly or masses felt. Normal bowel sounds heard. Central nervous system: Alert and oriented. No focal neurological deficits. Extremities: Symmetric 5 x 5 power. Skin: No rashes, lesions or ulcers Psychiatry: Judgement and insight appear normal. Mood & affect appropriate.     Data Reviewed:   CBC: Recent Labs  Lab 11/28/17 1530 11/29/17 0522  WBC 10.8* 26.5*  HGB 10.9* 11.0*  HCT 33.9* 33.8*  MCV 89.0 89.9  PLT 444* 948*   Basic Metabolic Panel: Recent Labs  Lab 11/28/17 1530 11/29/17 0522 11/29/17 0811  NA 140 136  --   K 4.1 5.3*  --   CL 106 103  --   CO2 23 19*  --   GLUCOSE 245* 435* 470*  BUN 24* 27*  --   CREATININE 1.24* 1.46*  --   CALCIUM 9.8 8.9  --   MG  --  1.9  --    GFR: Estimated Creatinine Clearance: 65.1 mL/min (A) (by C-G formula based on SCr of 1.46 mg/dL (H)). Liver Function Tests: Recent Labs  Lab 11/28/17 1530  AST 20  ALT 20  ALKPHOS 91  BILITOT 0.7  PROT 8.2*  ALBUMIN 3.6   Recent Labs  Lab 11/28/17 1530  LIPASE 35   No results for input(s): AMMONIA in the last 168 hours. Coagulation Profile: No results for input(s): INR, PROTIME in the last 168 hours. Cardiac Enzymes: Recent Labs  Lab 11/29/17 0204 11/29/17 0751  TROPONINI 0.06* 0.07*   BNP (last 3 results) No results for input(s): PROBNP in the last 8760 hours. HbA1C: No results for  input(s): HGBA1C in the last 72 hours. CBG: Recent Labs  Lab 11/28/17 2311 11/29/17 0757 11/29/17 1154  GLUCAP 244* 434* 389*   Lipid Profile: No results for input(s): CHOL, HDL, LDLCALC, TRIG, CHOLHDL, LDLDIRECT in the last 72 hours. Thyroid Function Tests: No results for input(s): TSH, T4TOTAL, FREET4, T3FREE, THYROIDAB in the last 72 hours. Anemia Panel: No results for input(s): VITAMINB12, FOLATE, FERRITIN, TIBC, IRON, RETICCTPCT in the last 72 hours. Sepsis Labs: No results for input(s): PROCALCITON, LATICACIDVEN in the last 168 hours.  Recent Results (from the past 240 hour(s))  MRSA PCR Screening     Status: None   Collection Time: 11/29/17  1:27 AM  Result Value Ref Range Status   MRSA by PCR NEGATIVE NEGATIVE Final    Comment:        The GeneXpert MRSA Assay (FDA approved for NASAL specimens only), is one component of a comprehensive MRSA colonization surveillance program. It is not intended to diagnose MRSA infection nor to guide or monitor treatment for MRSA infections. Performed at [facility], [place] [address], [place], [place] [address]  Radiology Studies: US Pelvis Transvanginal Non-ob (tv Only)  Result Date: 11/29/2017 CLINICAL DATA:  52 year old female with left adnexal mass on CT. EXAM: TRANSABDOMINAL AND TRANSVAGINAL ULTRASOUND OF PELVIS TECHNIQUE: Both transabdominal and transvaginal ultrasound examinations of the pelvis were performed. Transabdominal technique was performed for global imaging of the pelvis including uterus, ovaries, adnexal regions, and pelvic cul-de-sac. It was necessary to proceed with endovaginal exam following the transabdominal exam to visualize the endometrium and ovaries. COMPARISON:  CT of the abdomen pelvis dated 11/28/2017 FINDINGS: Evaluation is very limited due to patient's body habitus. Uterus Measurements: 7.5 x 3.9 x 3.3 cm. The uterus is anteverted. The uterus is suboptimally visualized and  with limited evaluation. Endometrium Thickness: 3 mm. The endometrium is poorly visualized and suboptimally evaluated. Right ovary Not visualized Left ovary The left ovary is not visualized. There is a 5.8 x 4.5 x 4.6 cm echogenic structure in the region of the left adnexa likely corresponding to the lesion seen on the earlier CT. This is not characterized but may represent a dermoid or less likely an endometrioma. Other etiologies are not excluded. Further characterization with MRI without and with contrast is recommended. Other findings No abnormal free fluid. IMPRESSION: Echogenic lesion in the left adnexa may represent a dermoid, or endometrioma. Other etiologies are not excluded. Further evaluation with MRI is recommended. Electronically Signed   By: Anner Crete M.D.   On: 11/29/2017 02:05   Ct Abdomen Pelvis W Contrast  Result Date: 11/28/2017 CLINICAL DATA:  52 year old with abdominal pain. EXAM: CT ABDOMEN AND PELVIS WITH CONTRAST TECHNIQUE: Multidetector CT imaging of the abdomen and pelvis was performed using the standard protocol following bolus administration of intravenous contrast. CONTRAST:  146mL ISOVUE-300 IOPAMIDOL (ISOVUE-300) INJECTION 61% COMPARISON:  Chest CT 04/20/2016 FINDINGS: Lower chest: Lung bases are clear. Hepatobiliary: Cholecystectomy. Normal appearance of the liver. Portal venous system is patent. No suspicious liver lesions. No biliary dilatation. Pancreas: Unremarkable. No pancreatic ductal dilatation or surrounding inflammatory changes. Spleen: Normal in size without focal abnormality. Adrenals/Urinary Tract: Normal adrenals. Urinary bladder is unremarkable. No hydronephrosis and no suspicious renal lesions. However, there is no evidence for contrast excretion on the 2 minute delayed renal images. Stomach/Bowel: Normal appearance of the stomach and duodenum. Normal appearance of small bowel without obstruction. No acute abnormality to the colon. There is a normal  appendix. Vascular/Lymphatic: Aortic calcifications without aneurysm. No significant lymph node enlargement in the abdomen or pelvis. Reproductive: There is a rounded solid structure in the left adnexal region that measures 4.3 x 4.6 x 5.2 cm. Structure has similar density to the adjacent uterus on this post contrast examination. Normal appearance of the right ovary/adnexa region. Other: No free fluid.  Negative for free air. Musculoskeletal: Degenerative facet disease in lower lumbar spine. Disc space narrowing and large anterior bridging osteophytes at L5-S1. IMPRESSION: 1. Indeterminate 5.2 cm solid structure in the left adnexa region. Differential diagnosis includes a left ovarian/adnexal mass versus a pedunculated uterine fibroid. Recommend further characterization with a pelvic ultrasound. 2. No excretion of iodinated contrast on the 2 minute delayed renal images. Findings can be associated with renal dysfunction and recommend follow-up metabolic panel to assess kidney function. Electronically Signed   By: Markus Daft M.D.   On: 11/28/2017 22:22   Dg Chest Port 1 View  Result Date: 11/28/2017 CLINICAL DATA:  CPR. EXAM: PORTABLE CHEST 1 VIEW COMPARISON:  None. FINDINGS: The heart size and mediastinal contours are within normal limits. Both lungs are clear. The visualized skeletal  structures are unremarkable. IMPRESSION: Clear lungs. Electronically Signed   By: Ulyses Jarred M.D.   On: 11/28/2017 22:46        Scheduled Meds: . amLODipine  10 mg Oral Daily  . atorvastatin  10 mg Oral q1800  . DULoxetine  30 mg Oral BID  . enoxaparin (LOVENOX) injection  60 mg Subcutaneous QHS  . insulin aspart  0-15 Units Subcutaneous TID WC  . insulin glargine  40 Units Subcutaneous QHS  . loratadine  10 mg Oral Daily  . mouth rinse  15 mL Mouth Rinse BID  . Melatonin  6 mg Oral QHS  . metoCLOPramide  10 mg Oral TID AC & HS  . metoprolol succinate  25 mg Oral Daily  . pantoprazole  40 mg Oral Daily    Continuous Infusions: . sodium chloride 100 mL/hr at 11/29/17 1230     LOS: 0 days   Time spent= 40 mins    Homer Pfeifer Arsenio Loader, MD [facility] Pager [phone]   If 7PM-7AM, please contact night-coverage [email or web address] Password [facility] 11/29/2017, 2:53 PM

## 2017-11-29 NOTE — Progress Notes (Signed)
CRITICAL VALUE ALERT  Critical Value:  Troponin 0.06  Date & Time Notied:  11/29/17 0247  Provider Notified: Raliegh Ip Schorr  Orders Received/Actions taken: No new orders

## 2017-11-29 NOTE — Progress Notes (Signed)
Lovenox per Pharmacy for DVT Prophylaxis    Pharmacy has been consulted from dosing enoxaparin (lovenox) in this patient for DVT prophylaxis.  The pharmacist has reviewed pertinent labs (Hgb _10.9__; PLT_444_), patient weight (_142__kg) and renal function (CrCl_76__mL/min) and decided that enoxaparin _60_mg SQ Q24Hrs is appropriate for this patient.  The pharmacy department will sign off at this time.  Please reconsult pharmacy if status changes or for further issues.  Thank you  Cyndia Diver PharmD, BCPS  11/29/2017, 1:40 AM

## 2017-11-30 ENCOUNTER — Inpatient Hospital Stay (HOSPITAL_COMMUNITY): Payer: Medicaid Other

## 2017-11-30 DIAGNOSIS — I469 Cardiac arrest, cause unspecified: Secondary | ICD-10-CM

## 2017-11-30 DIAGNOSIS — R1900 Intra-abdominal and pelvic swelling, mass and lump, unspecified site: Secondary | ICD-10-CM

## 2017-11-30 DIAGNOSIS — K3184 Gastroparesis: Secondary | ICD-10-CM

## 2017-11-30 DIAGNOSIS — I361 Nonrheumatic tricuspid (valve) insufficiency: Secondary | ICD-10-CM

## 2017-11-30 DIAGNOSIS — E1143 Type 2 diabetes mellitus with diabetic autonomic (poly)neuropathy: Secondary | ICD-10-CM

## 2017-11-30 DIAGNOSIS — T508X5A Adverse effect of diagnostic agents, initial encounter: Secondary | ICD-10-CM

## 2017-11-30 LAB — COMPREHENSIVE METABOLIC PANEL
ALK PHOS: 63 U/L (ref 38–126)
ALT: 16 U/L (ref 0–44)
AST: 16 U/L (ref 15–41)
Albumin: 2.9 g/dL — ABNORMAL LOW (ref 3.5–5.0)
Anion gap: 8 (ref 5–15)
BUN: 35 mg/dL — ABNORMAL HIGH (ref 6–20)
CALCIUM: 8.7 mg/dL — AB (ref 8.9–10.3)
CO2: 21 mmol/L — ABNORMAL LOW (ref 22–32)
CREATININE: 1.75 mg/dL — AB (ref 0.44–1.00)
Chloride: 109 mmol/L (ref 98–111)
GFR, EST AFRICAN AMERICAN: 37 mL/min — AB (ref >60)
GFR, EST NON AFRICAN AMERICAN: 32 mL/min — AB (ref >60)
Glucose, Bld: 212 mg/dL — ABNORMAL HIGH (ref 70–99)
Potassium: 4 mmol/L (ref 3.5–5.1)
Sodium: 138 mmol/L (ref 135–145)
TOTAL PROTEIN: 6.4 g/dL — AB (ref 6.5–8.1)
Total Bilirubin: 0.3 mg/dL (ref 0.3–1.2)

## 2017-11-30 LAB — GLUCOSE, CAPILLARY
GLUCOSE-CAPILLARY: 183 mg/dL — AB (ref 70–99)
GLUCOSE-CAPILLARY: 177 mg/dL — AB (ref 70–99)
Glucose-Capillary: 190 mg/dL — ABNORMAL HIGH (ref 70–99)
Glucose-Capillary: 198 mg/dL — ABNORMAL HIGH (ref 70–99)

## 2017-11-30 LAB — CBC
HCT: 26.7 % — ABNORMAL LOW (ref 36.0–46.0)
HEMOGLOBIN: 8.5 g/dL — AB (ref 12.0–15.0)
MCH: 28.6 pg (ref 26.0–34.0)
MCHC: 31.8 g/dL (ref 30.0–36.0)
MCV: 89.9 fL (ref 78.0–100.0)
PLATELETS: 376 10*3/uL (ref 150–400)
RBC: 2.97 MIL/uL — ABNORMAL LOW (ref 3.87–5.11)
RDW: 13.9 % (ref 11.5–15.5)
WBC: 23 10*3/uL — ABNORMAL HIGH (ref 4.0–10.5)

## 2017-11-30 LAB — TSH: TSH: 2.465 u[IU]/mL (ref 0.350–4.500)

## 2017-11-30 LAB — ECHOCARDIOGRAM COMPLETE
HEIGHTINCHES: 66 in
Weight: 5016 oz

## 2017-11-30 LAB — MAGNESIUM: MAGNESIUM: 1.9 mg/dL (ref 1.7–2.4)

## 2017-11-30 NOTE — Progress Notes (Signed)
  Echocardiogram 2D Echocardiogram has been performed.  Deborah [name] 11/30/2017, 9:30 AM

## 2017-11-30 NOTE — Progress Notes (Signed)
PROGRESS NOTE    Deborah [name]  ERX:[id number] DOB: [birth year] DOA: 11/28/2017 PCP: Patient, No Pcp Per    Brief Narrative: 51 year old with history of essential hypertension, hyperlipidemia, insulin-dependent diabetes mellitus type 2 with gastroparesis came to the hospital with complaints of abdominal pain nausea and vomiting.  She has been on p.o. Reglan without any relief.  Her initial labs are unremarkable however CT of the abdomen pelvis were ordered which showed 5.2 cm left adnexal solid structure.  But sometime after IV contrast administration patient reportedly lost her pulse and ended up getting CPR along with epinephrine for 1 minute.  Assessment & Plan:   Principal Problem:   Allergic reaction to contrast media, initial encounter Active Problems:   Lower abdominal pain   Normocytic normochromic anemia   Cardiac arrest (HCC)   Pelvic mass   Leukocytosis   Anxiety   Cardiac arrest possibly secondary to an allergic reaction to contrast versus vasovagal episode Patient had epinephrine injection and circulation returned.  Echocardiogram done results reviewed.  TSH within normal limits.    Lower abdominal pain with nausea and vomiting possibly secondary to gastroparesis.  CT abdomen and pelvis negative for acute pathology.  Lipase and liver function test are normal.   Acute kidney injury possibly secondary to contrast.  Creatinine worsening.  Would recommend continue IV fluids for another 24 hours and repeat BMP in the a.m. to check creatinine.    Type 2 diabetes mellitus CBG (last 3)  Recent Labs    11/29/17 2117 11/30/17 0758 11/30/17 1130  GLUCAP 277* 183* 177*   Resume SSI for now no change in medications.    Hypertension Well controlled.   ANXIETY:  resume Cymbalta and Xanax.     HYPERLIipidemia continue statin.  Left Side adnexal mass MRI showed its probably a fibroid.  Commend outpatient follow-up with GYN.   Leukocytosis:  Suspect  reactive. No signs of infection.    Anemia: ? Anemia of chronic disease.  Baseline hemoglobin between 9 to 10.  Dropped to 8.5 today, suspect a component of dilution.  Anemia panel ordered tomorrow.    DVT prophylaxis: lovenox.  Code Status:  Full code.  Family Communication:  None at bedside.  Disposition Plan: pending further eval.    Consultants:   None.    Procedures: none.   Antimicrobials: none.   Subjective: Reports feeling tired.   Objective: Vitals:   11/29/17 1000 11/29/17 2119 11/30/17 0539 11/30/17 1306  BP: (!) 106/56 131/75 127/81 (!) 127/49  Pulse: (!) 105 (!) 102 99 96  Resp: (!) 21 18 18    Temp:  98.2 F (36.8 C) 97.9 F (36.6 C) (!) 97.3 F (36.3 C)  TempSrc:  Oral Oral Oral  SpO2: 95% 97% 97% 99%  Weight:      Height:        Intake/Output Summary (Last 24 hours) at 11/30/2017 1640 Last data filed at 11/30/2017 1321 Gross per 24 hour  Intake 520 ml  Output 150 ml  Net 370 ml   Filed Weights   11/28/17 1343  Weight: (!) 142.2 kg    Examination:  General exam: Appears calm and comfortable  Respiratory system: Clear to auscultation. Respiratory effort normal. Cardiovascular system: S1 & S2 heard, RRR. No JVD, murmurs, rubs, gallops or clicks. No pedal edema. Gastrointestinal system: Abdomen is nondistended, soft and nontender. No organomegaly or masses felt. Normal bowel sounds heard. Central nervous system: Alert and oriented. No focal neurological deficits. Extremities: Symmetric 5 x 5 power.  Skin: No rashes, lesions or ulcers Psychiatry:  Mood & affect appropriate.     Data Reviewed: I have personally reviewed following labs and imaging studies  CBC: Recent Labs  Lab 11/28/17 1530 11/29/17 0522 11/30/17 0434  WBC 10.8* 26.5* 23.0*  HGB 10.9* 11.0* 8.5*  HCT 33.9* 33.8* 26.7*  MCV 89.0 89.9 89.9  PLT 444* 402* 742   Basic Metabolic Panel: Recent Labs  Lab 11/28/17 1530 11/29/17 0522 11/29/17 0811 11/30/17 0434    NA 140 136  --  138  K 4.1 5.3*  --  4.0  CL 106 103  --  109  CO2 23 19*  --  21*  GLUCOSE 245* 435* 470* 212*  BUN 24* 27*  --  35*  CREATININE 1.24* 1.46*  --  1.75*  CALCIUM 9.8 8.9  --  8.7*  MG  --  1.9  --  1.9   GFR: Estimated Creatinine Clearance: 54.3 mL/min (A) (by C-G formula based on SCr of 1.75 mg/dL (H)). Liver Function Tests: Recent Labs  Lab 11/28/17 1530 11/30/17 0434  AST 20 16  ALT 20 16  ALKPHOS 91 63  BILITOT 0.7 0.3  PROT 8.2* 6.4*  ALBUMIN 3.6 2.9*   Recent Labs  Lab 11/28/17 1530  LIPASE 35   No results for input(s): AMMONIA in the last 168 hours. Coagulation Profile: No results for input(s): INR, PROTIME in the last 168 hours. Cardiac Enzymes: Recent Labs  Lab 11/29/17 0204 11/29/17 0751  TROPONINI 0.06* 0.07*   BNP (last 3 results) No results for input(s): PROBNP in the last 8760 hours. HbA1C: No results for input(s): HGBA1C in the last 72 hours. CBG: Recent Labs  Lab 11/29/17 1154 11/29/17 1653 11/29/17 2117 11/30/17 0758 11/30/17 1130  GLUCAP 389* 336* 277* 183* 177*   Lipid Profile: No results for input(s): CHOL, HDL, LDLCALC, TRIG, CHOLHDL, LDLDIRECT in the last 72 hours. Thyroid Function Tests: Recent Labs    11/30/17 0434  TSH 2.465   Anemia Panel: No results for input(s): VITAMINB12, FOLATE, FERRITIN, TIBC, IRON, RETICCTPCT in the last 72 hours. Sepsis Labs: No results for input(s): PROCALCITON, LATICACIDVEN in the last 168 hours.  Recent Results (from the past 240 hour(s))  MRSA PCR Screening     Status: None   Collection Time: 11/29/17  1:27 AM  Result Value Ref Range Status   MRSA by PCR NEGATIVE NEGATIVE Final    Comment:        The GeneXpert MRSA Assay (FDA approved for NASAL specimens only), is one component of a comprehensive MRSA colonization surveillance program. It is not intended to diagnose MRSA infection nor to guide or monitor treatment for MRSA infections. Performed at [facility], [place] [address], [place], [place] [address]          Radiology Studies: Mr Pelvis Wo Contrast  Result Date: 11/29/2017 CLINICAL DATA:  Uterine mass. Renal transplant dysfunction. History of diabetes and iodinated contrast allergy. EXAM: MRI PELVIS WITHOUT CONTRAST TECHNIQUE: Multiplanar multisequence MR imaging of the pelvis was performed. No intravenous contrast was administered. COMPARISON:  Pelvic CT 11/28/2017.  Pelvic ultrasound 11/29/2017. FINDINGS: Despite efforts by the technologist and patient, mild motion artifact is present on today's exam and could not be eliminated. This reduces exam sensitivity and specificity. Urinary Tract: The visualized distal ureters and bladder appear unremarkable. There is no evidence of renal transplant on this study or yesterday CT, and the provided history appears to be in error. Bowel: No bowel wall  thickening, distention or surrounding inflammation identified within the pelvis. Vascular/Lymphatic: No enlarged pelvic lymph nodes identified. No significant vascular findings. Reproductive: Uterus: Measures 8.0 x 3.3 x 5.3 cm. There is an intramural fundal fibroid measuring 15 mm on coronal image 26/4. The lesion of concern in the left adnexa is contiguous with the uterus and measures 4.9 x 4.2 x 5.0 cm. This demonstrates low T2 signal and is most consistent with an exophytic fibroid. There is another adjacent smaller fibroid measuring 17 mm on coronal image 22. Endometrium:  Unremarkable. Cervix/Vagina: Small cervical nabothian cysts. The vagina appears unremarkable. Right ovary:  Appears normal.  No mass identified. Left ovary: Abuts the left adnexal lesion, although appears separate from it. Other: No ascites. Musculoskeletal: No acute or worrisome osseous findings. IMPRESSION: 1. The lesion of concern in the left adnexa abuts the uterus, has a broad based connection and low T2 signal, most consistent with an exophytic fibroid. There are  additional smaller intramural uterine fibroids. 2. No suspicious adnexal findings. Electronically Signed   By: Richardean Sale M.D.   On: 11/29/2017 15:19   US Pelvis Transvanginal Non-ob (tv Only)  Result Date: 11/29/2017 CLINICAL DATA:  52 year old female with left adnexal mass on CT. EXAM: TRANSABDOMINAL AND TRANSVAGINAL ULTRASOUND OF PELVIS TECHNIQUE: Both transabdominal and transvaginal ultrasound examinations of the pelvis were performed. Transabdominal technique was performed for global imaging of the pelvis including uterus, ovaries, adnexal regions, and pelvic cul-de-sac. It was necessary to proceed with endovaginal exam following the transabdominal exam to visualize the endometrium and ovaries. COMPARISON:  CT of the abdomen pelvis dated 11/28/2017 FINDINGS: Evaluation is very limited due to patient's body habitus. Uterus Measurements: 7.5 x 3.9 x 3.3 cm. The uterus is anteverted. The uterus is suboptimally visualized and with limited evaluation. Endometrium Thickness: 3 mm. The endometrium is poorly visualized and suboptimally evaluated. Right ovary Not visualized Left ovary The left ovary is not visualized. There is a 5.8 x 4.5 x 4.6 cm echogenic structure in the region of the left adnexa likely corresponding to the lesion seen on the earlier CT. This is not characterized but may represent a dermoid or less likely an endometrioma. Other etiologies are not excluded. Further characterization with MRI without and with contrast is recommended. Other findings No abnormal free fluid. IMPRESSION: Echogenic lesion in the left adnexa may represent a dermoid, or endometrioma. Other etiologies are not excluded. Further evaluation with MRI is recommended. Electronically Signed   By: Anner Crete M.D.   On: 11/29/2017 02:05   Ct Abdomen Pelvis W Contrast  Result Date: 11/28/2017 CLINICAL DATA:  52 year old with abdominal pain. EXAM: CT ABDOMEN AND PELVIS WITH CONTRAST TECHNIQUE: Multidetector CT imaging  of the abdomen and pelvis was performed using the standard protocol following bolus administration of intravenous contrast. CONTRAST:  161mL ISOVUE-300 IOPAMIDOL (ISOVUE-300) INJECTION 61% COMPARISON:  Chest CT 04/20/2016 FINDINGS: Lower chest: Lung bases are clear. Hepatobiliary: Cholecystectomy. Normal appearance of the liver. Portal venous system is patent. No suspicious liver lesions. No biliary dilatation. Pancreas: Unremarkable. No pancreatic ductal dilatation or surrounding inflammatory changes. Spleen: Normal in size without focal abnormality. Adrenals/Urinary Tract: Normal adrenals. Urinary bladder is unremarkable. No hydronephrosis and no suspicious renal lesions. However, there is no evidence for contrast excretion on the 2 minute delayed renal images. Stomach/Bowel: Normal appearance of the stomach and duodenum. Normal appearance of small bowel without obstruction. No acute abnormality to the colon. There is a normal appendix. Vascular/Lymphatic: Aortic calcifications without aneurysm. No significant lymph node enlargement in  the abdomen or pelvis. Reproductive: There is a rounded solid structure in the left adnexal region that measures 4.3 x 4.6 x 5.2 cm. Structure has similar density to the adjacent uterus on this post contrast examination. Normal appearance of the right ovary/adnexa region. Other: No free fluid.  Negative for free air. Musculoskeletal: Degenerative facet disease in lower lumbar spine. Disc space narrowing and large anterior bridging osteophytes at L5-S1. IMPRESSION: 1. Indeterminate 5.2 cm solid structure in the left adnexa region. Differential diagnosis includes a left ovarian/adnexal mass versus a pedunculated uterine fibroid. Recommend further characterization with a pelvic ultrasound. 2. No excretion of iodinated contrast on the 2 minute delayed renal images. Findings can be associated with renal dysfunction and recommend follow-up metabolic panel to assess kidney function.  Electronically Signed   By: Markus Daft M.D.   On: 11/28/2017 22:22   Dg Chest Port 1 View  Result Date: 11/28/2017 CLINICAL DATA:  CPR. EXAM: PORTABLE CHEST 1 VIEW COMPARISON:  None. FINDINGS: The heart size and mediastinal contours are within normal limits. Both lungs are clear. The visualized skeletal structures are unremarkable. IMPRESSION: Clear lungs. Electronically Signed   By: Ulyses Jarred M.D.   On: 11/28/2017 22:46        Scheduled Meds: . amLODipine  10 mg Oral Daily  . atorvastatin  10 mg Oral q1800  . DULoxetine  30 mg Oral BID  . enoxaparin (LOVENOX) injection  60 mg Subcutaneous QHS  . insulin aspart  0-15 Units Subcutaneous TID WC  . insulin glargine  40 Units Subcutaneous QHS  . loratadine  10 mg Oral Daily  . mouth rinse  15 mL Mouth Rinse BID  . Melatonin  6 mg Oral QHS  . metoCLOPramide  10 mg Oral TID AC & HS  . metoprolol succinate  25 mg Oral Daily  . pantoprazole  40 mg Oral Daily   Continuous Infusions: . sodium chloride 100 mL/hr at 11/30/17 1045     LOS: 1 day    Time spent: 35 minutes.     Hosie Poisson, MD [facility] Pager [phone]   If 7PM-7AM, please contact night-coverage [email or web address] Password [facility] 11/30/2017, 4:40 PM

## 2017-11-30 NOTE — Progress Notes (Signed)
PROGRESS NOTE    Deborah [name]  KGY:[id number] DOB: 10-26-1965 DOA: 11/28/2017 PCP: Patient, No Pcp Per    Brief Narrative: 52 year old with history of essential hypertension, hyperlipidemia, insulin-dependent diabetes mellitus type 2 with gastroparesis came to the hospital with complaints of abdominal pain nausea and vomiting.  She has been on p.o. Reglan without any relief.  Her initial labs are unremarkable however CT of the abdomen pelvis were ordered which showed 5.2 cm left adnexal solid structure.  But sometime after IV contrast administration patient reportedly lost her pulse and ended up getting CPR along with epinephrine for 1 minute.  Assessment & Plan:   Principal Problem:   Allergic reaction to contrast media, initial encounter Active Problems:   Lower abdominal pain   Normocytic normochromic anemia   Cardiac arrest (HCC)   Pelvic mass   Leukocytosis   Anxiety   Cardiac arrest possibly secondary to an allergic reaction to contrast versus vasovagal episode Patient had epinephrine injection and circulation returned.  Echocardiogram done results reviewed.  TSH within normal limits.    Lower abdominal pain with nausea and vomiting possibly secondary to gastroparesis.  CT abdomen and pelvis negative for acute pathology.  Lipase and liver function test are normal.   Acute kidney injury possibly secondary to contrast.  Creatinine worsening.  Would recommend continue IV fluids for another 24 hours and repeat BMP in the a.m. to check creatinine.   Type 2 diabetes mellitus CBG (last 3)  Recent Labs    11/29/17 2117 11/30/17 0758 11/30/17 1130  GLUCAP 277* 183* 177*   Resume SSI for now no change in medications.    Hypertension Well controlled.   Diet he resume Cymbalta and Xanax.   Lipidemia continue statin.  Left Seidel adnexal mass MRI showed its probably a fibroid.  Commend outpatient follow-up with GYN.   DVT prophylaxis: lovenox.  Code  Status:  Full code.  Family Communication:  None at bedside.  Disposition Plan: pending further eval.    Consultants:   None.    Procedures: none.   Antimicrobials: none.   Subjective: Reports feeling tired.   Objective: Vitals:   11/29/17 1000 11/29/17 2119 11/30/17 0539 11/30/17 1306  BP: (!) 106/56 131/75 127/81 (!) 127/49  Pulse: (!) 105 (!) 102 99 96  Resp: (!) 21 18 18    Temp:  98.2 F (36.8 C) 97.9 F (36.6 C) (!) 97.3 F (36.3 C)  TempSrc:  Oral Oral Oral  SpO2: 95% 97% 97% 99%  Weight:      Height:        Intake/Output Summary (Last 24 hours) at 11/30/2017 1538 Last data filed at 11/30/2017 1321 Gross per 24 hour  Intake 520 ml  Output 150 ml  Net 370 ml   Filed Weights   11/28/17 1343  Weight: (!) 142.2 kg    Examination:  General exam: Appears calm and comfortable  Respiratory system: Clear to auscultation. Respiratory effort normal. Cardiovascular system: S1 & S2 heard, RRR. No JVD, murmurs, rubs, gallops or clicks. No pedal edema. Gastrointestinal system: Abdomen is nondistended, soft and nontender. No organomegaly or masses felt. Normal bowel sounds heard. Central nervous system: Alert and oriented. No focal neurological deficits. Extremities: Symmetric 5 x 5 power. Skin: No rashes, lesions or ulcers Psychiatry:  Mood & affect appropriate.     Data Reviewed: I have personally reviewed following labs and imaging studies  CBC: Recent Labs  Lab 11/28/17 1530 11/29/17 0522 11/30/17 0434  WBC 10.8* 26.5* 23.0*  HGB 10.9* 11.0* 8.5*  HCT 33.9* 33.8* 26.7*  MCV 89.0 89.9 89.9  PLT 444* 402* 759   Basic Metabolic Panel: Recent Labs  Lab 11/28/17 1530 11/29/17 0522 11/29/17 0811 11/30/17 0434  NA 140 136  --  138  K 4.1 5.3*  --  4.0  CL 106 103  --  109  CO2 23 19*  --  21*  GLUCOSE 245* 435* 470* 212*  BUN 24* 27*  --  35*  CREATININE 1.24* 1.46*  --  1.75*  CALCIUM 9.8 8.9  --  8.7*  MG  --  1.9  --  1.9    GFR: Estimated Creatinine Clearance: 54.3 mL/min (A) (by C-G formula based on SCr of 1.75 mg/dL (H)). Liver Function Tests: Recent Labs  Lab 11/28/17 1530 11/30/17 0434  AST 20 16  ALT 20 16  ALKPHOS 91 63  BILITOT 0.7 0.3  PROT 8.2* 6.4*  ALBUMIN 3.6 2.9*   Recent Labs  Lab 11/28/17 1530  LIPASE 35   No results for input(s): AMMONIA in the last 168 hours. Coagulation Profile: No results for input(s): INR, PROTIME in the last 168 hours. Cardiac Enzymes: Recent Labs  Lab 11/29/17 0204 11/29/17 0751  TROPONINI 0.06* 0.07*   BNP (last 3 results) No results for input(s): PROBNP in the last 8760 hours. HbA1C: No results for input(s): HGBA1C in the last 72 hours. CBG: Recent Labs  Lab 11/29/17 1154 11/29/17 1653 11/29/17 2117 11/30/17 0758 11/30/17 1130  GLUCAP 389* 336* 277* 183* 177*   Lipid Profile: No results for input(s): CHOL, HDL, LDLCALC, TRIG, CHOLHDL, LDLDIRECT in the last 72 hours. Thyroid Function Tests: Recent Labs    11/30/17 0434  TSH 2.465   Anemia Panel: No results for input(s): VITAMINB12, FOLATE, FERRITIN, TIBC, IRON, RETICCTPCT in the last 72 hours. Sepsis Labs: No results for input(s): PROCALCITON, LATICACIDVEN in the last 168 hours.  Recent Results (from the past 240 hour(s))  MRSA PCR Screening     Status: None   Collection Time: 11/29/17  1:27 AM  Result Value Ref Range Status   MRSA by PCR NEGATIVE NEGATIVE Final    Comment:        The GeneXpert MRSA Assay (FDA approved for NASAL specimens only), is one component of a comprehensive MRSA colonization surveillance program. It is not intended to diagnose MRSA infection nor to guide or monitor treatment for MRSA infections. Performed at [facility], [place] [address] [place], [place] [address]          Radiology Studies: Mr Pelvis Wo Contrast  Result Date: 11/29/2017 CLINICAL DATA:  Uterine mass. Renal transplant dysfunction. History of diabetes and  iodinated contrast allergy. EXAM: MRI PELVIS WITHOUT CONTRAST TECHNIQUE: Multiplanar multisequence MR imaging of the pelvis was performed. No intravenous contrast was administered. COMPARISON:  Pelvic CT 11/28/2017.  Pelvic ultrasound 11/29/2017. FINDINGS: Despite efforts by the technologist and patient, mild motion artifact is present on today's exam and could not be eliminated. This reduces exam sensitivity and specificity. Urinary Tract: The visualized distal ureters and bladder appear unremarkable. There is no evidence of renal transplant on this study or yesterday CT, and the provided history appears to be in error. Bowel: No bowel wall thickening, distention or surrounding inflammation identified within the pelvis. Vascular/Lymphatic: No enlarged pelvic lymph nodes identified. No significant vascular findings. Reproductive: Uterus: Measures 8.0 x 3.3 x 5.3 cm. There is an intramural fundal fibroid measuring 15 mm on coronal image 26/4. The lesion of concern  in the left adnexa is contiguous with the uterus and measures 4.9 x 4.2 x 5.0 cm. This demonstrates low T2 signal and is most consistent with an exophytic fibroid. There is another adjacent smaller fibroid measuring 17 mm on coronal image 22. Endometrium:  Unremarkable. Cervix/Vagina: Small cervical nabothian cysts. The vagina appears unremarkable. Right ovary:  Appears normal.  No mass identified. Left ovary: Abuts the left adnexal lesion, although appears separate from it. Other: No ascites. Musculoskeletal: No acute or worrisome osseous findings. IMPRESSION: 1. The lesion of concern in the left adnexa abuts the uterus, has a broad based connection and low T2 signal, most consistent with an exophytic fibroid. There are additional smaller intramural uterine fibroids. 2. No suspicious adnexal findings. Electronically Signed   By: Richardean Sale M.D.   On: 11/29/2017 15:19   US Pelvis Transvanginal Non-ob (tv Only)  Result Date: 11/29/2017 CLINICAL  DATA:  52 year old female with left adnexal mass on CT. EXAM: TRANSABDOMINAL AND TRANSVAGINAL ULTRASOUND OF PELVIS TECHNIQUE: Both transabdominal and transvaginal ultrasound examinations of the pelvis were performed. Transabdominal technique was performed for global imaging of the pelvis including uterus, ovaries, adnexal regions, and pelvic cul-de-sac. It was necessary to proceed with endovaginal exam following the transabdominal exam to visualize the endometrium and ovaries. COMPARISON:  CT of the abdomen pelvis dated 11/28/2017 FINDINGS: Evaluation is very limited due to patient's body habitus. Uterus Measurements: 7.5 x 3.9 x 3.3 cm. The uterus is anteverted. The uterus is suboptimally visualized and with limited evaluation. Endometrium Thickness: 3 mm. The endometrium is poorly visualized and suboptimally evaluated. Right ovary Not visualized Left ovary The left ovary is not visualized. There is a 5.8 x 4.5 x 4.6 cm echogenic structure in the region of the left adnexa likely corresponding to the lesion seen on the earlier CT. This is not characterized but may represent a dermoid or less likely an endometrioma. Other etiologies are not excluded. Further characterization with MRI without and with contrast is recommended. Other findings No abnormal free fluid. IMPRESSION: Echogenic lesion in the left adnexa may represent a dermoid, or endometrioma. Other etiologies are not excluded. Further evaluation with MRI is recommended. Electronically Signed   By: Anner Crete M.D.   On: 11/29/2017 02:05   Ct Abdomen Pelvis W Contrast  Result Date: 11/28/2017 CLINICAL DATA:  52 year old with abdominal pain. EXAM: CT ABDOMEN AND PELVIS WITH CONTRAST TECHNIQUE: Multidetector CT imaging of the abdomen and pelvis was performed using the standard protocol following bolus administration of intravenous contrast. CONTRAST:  127mL ISOVUE-300 IOPAMIDOL (ISOVUE-300) INJECTION 61% COMPARISON:  Chest CT 04/20/2016 FINDINGS: Lower  chest: Lung bases are clear. Hepatobiliary: Cholecystectomy. Normal appearance of the liver. Portal venous system is patent. No suspicious liver lesions. No biliary dilatation. Pancreas: Unremarkable. No pancreatic ductal dilatation or surrounding inflammatory changes. Spleen: Normal in size without focal abnormality. Adrenals/Urinary Tract: Normal adrenals. Urinary bladder is unremarkable. No hydronephrosis and no suspicious renal lesions. However, there is no evidence for contrast excretion on the 2 minute delayed renal images. Stomach/Bowel: Normal appearance of the stomach and duodenum. Normal appearance of small bowel without obstruction. No acute abnormality to the colon. There is a normal appendix. Vascular/Lymphatic: Aortic calcifications without aneurysm. No significant lymph node enlargement in the abdomen or pelvis. Reproductive: There is a rounded solid structure in the left adnexal region that measures 4.3 x 4.6 x 5.2 cm. Structure has similar density to the adjacent uterus on this post contrast examination. Normal appearance of the right ovary/adnexa region. Other: No  free fluid.  Negative for free air. Musculoskeletal: Degenerative facet disease in lower lumbar spine. Disc space narrowing and large anterior bridging osteophytes at L5-S1. IMPRESSION: 1. Indeterminate 5.2 cm solid structure in the left adnexa region. Differential diagnosis includes a left ovarian/adnexal mass versus a pedunculated uterine fibroid. Recommend further characterization with a pelvic ultrasound. 2. No excretion of iodinated contrast on the 2 minute delayed renal images. Findings can be associated with renal dysfunction and recommend follow-up metabolic panel to assess kidney function. Electronically Signed   By: Markus Daft M.D.   On: 11/28/2017 22:22   Dg Chest Port 1 View  Result Date: 11/28/2017 CLINICAL DATA:  CPR. EXAM: PORTABLE CHEST 1 VIEW COMPARISON:  None. FINDINGS: The heart size and mediastinal contours are  within normal limits. Both lungs are clear. The visualized skeletal structures are unremarkable. IMPRESSION: Clear lungs. Electronically Signed   By: Ulyses Jarred M.D.   On: 11/28/2017 22:46        Scheduled Meds: . amLODipine  10 mg Oral Daily  . atorvastatin  10 mg Oral q1800  . DULoxetine  30 mg Oral BID  . enoxaparin (LOVENOX) injection  60 mg Subcutaneous QHS  . insulin aspart  0-15 Units Subcutaneous TID WC  . insulin glargine  40 Units Subcutaneous QHS  . loratadine  10 mg Oral Daily  . mouth rinse  15 mL Mouth Rinse BID  . Melatonin  6 mg Oral QHS  . metoCLOPramide  10 mg Oral TID AC & HS  . metoprolol succinate  25 mg Oral Daily  . pantoprazole  40 mg Oral Daily   Continuous Infusions: . sodium chloride 100 mL/hr at 11/30/17 1045     LOS: 1 day    Time spent: 35 minutes.     Hosie Poisson, MD [facility] Pager [phone]   If 7PM-7AM, please contact night-coverage [email or web address] Password [facility] 11/30/2017, 3:38 PM

## 2017-11-30 NOTE — Progress Notes (Signed)
  Echocardiogram 2D Echocardiogram has been performed.  Deborah [name] 11/30/2017, 9:33 AM

## 2017-12-01 ENCOUNTER — Inpatient Hospital Stay (HOSPITAL_COMMUNITY): Payer: Medicaid Other

## 2017-12-01 LAB — BASIC METABOLIC PANEL
Anion gap: 7 (ref 5–15)
BUN: 23 mg/dL — AB (ref 6–20)
CHLORIDE: 109 mmol/L (ref 98–111)
CO2: 23 mmol/L (ref 22–32)
Calcium: 9 mg/dL (ref 8.9–10.3)
Creatinine, Ser: 1.29 mg/dL — ABNORMAL HIGH (ref 0.44–1.00)
GFR calc Af Amer: 54 mL/min — ABNORMAL LOW (ref >60)
GFR calc non Af Amer: 46 mL/min — ABNORMAL LOW (ref >60)
Glucose, Bld: 123 mg/dL — ABNORMAL HIGH (ref 70–99)
POTASSIUM: 3.6 mmol/L (ref 3.5–5.1)
SODIUM: 139 mmol/L (ref 135–145)

## 2017-12-01 LAB — CBC
HCT: 28 % — ABNORMAL LOW (ref 36.0–46.0)
HEMOGLOBIN: 8.9 g/dL — AB (ref 12.0–15.0)
MCH: 28.6 pg (ref 26.0–34.0)
MCHC: 31.8 g/dL (ref 30.0–36.0)
MCV: 90 fL (ref 78.0–100.0)
Platelets: 387 10*3/uL (ref 150–400)
RBC: 3.11 MIL/uL — AB (ref 3.87–5.11)
RDW: 13.8 % (ref 11.5–15.5)
WBC: 18.2 10*3/uL — ABNORMAL HIGH (ref 4.0–10.5)

## 2017-12-01 LAB — IRON AND TIBC
Iron: 30 ug/dL (ref 28–170)
SATURATION RATIOS: 9 % — AB (ref 10.4–31.8)
TIBC: 317 ug/dL (ref 250–450)
UIBC: 287 ug/dL

## 2017-12-01 LAB — RETICULOCYTES
RBC.: 3.11 MIL/uL — ABNORMAL LOW (ref 3.87–5.11)
Retic Count, Absolute: 115.1 10*3/uL (ref 19.0–186.0)
Retic Ct Pct: 3.7 % — ABNORMAL HIGH (ref 0.4–3.1)

## 2017-12-01 LAB — GLUCOSE, CAPILLARY
GLUCOSE-CAPILLARY: 123 mg/dL — AB (ref 70–99)
GLUCOSE-CAPILLARY: 247 mg/dL — AB (ref 70–99)
Glucose-Capillary: 140 mg/dL — ABNORMAL HIGH (ref 70–99)
Glucose-Capillary: 263 mg/dL — ABNORMAL HIGH (ref 70–99)

## 2017-12-01 LAB — FOLATE: FOLATE: 12.9 ng/mL (ref >5.9)

## 2017-12-01 LAB — FERRITIN: Ferritin: 78 ng/mL (ref 11–307)

## 2017-12-01 LAB — VITAMIN B12: VITAMIN B 12: 454 pg/mL (ref 180–914)

## 2017-12-01 MED ORDER — SODIUM CHLORIDE 0.9 % IV SOLN
2.0000 g | Freq: Three times a day (TID) | INTRAVENOUS | Status: DC
  Administered 2017-12-01 – 2017-12-02 (×3): 2 g via INTRAVENOUS
  Filled 2017-12-01 (×4): qty 2

## 2017-12-01 MED ORDER — GUAIFENESIN 100 MG/5ML PO SOLN
5.0000 mL | ORAL | Status: DC | PRN
  Administered 2017-12-01: 100 mg via ORAL
  Filled 2017-12-01: qty 10

## 2017-12-01 MED ORDER — OXYCODONE HCL 5 MG PO TABS
5.0000 mg | ORAL_TABLET | Freq: Once | ORAL | Status: AC
  Administered 2017-12-01: 5 mg via ORAL
  Filled 2017-12-01: qty 1

## 2017-12-01 MED ORDER — VANCOMYCIN HCL IN DEXTROSE 1-5 GM/200ML-% IV SOLN
1000.0000 mg | Freq: Two times a day (BID) | INTRAVENOUS | Status: DC
  Administered 2017-12-02: 1000 mg via INTRAVENOUS
  Filled 2017-12-01: qty 200

## 2017-12-01 MED ORDER — VANCOMYCIN HCL 10 G IV SOLR
2500.0000 mg | Freq: Once | INTRAVENOUS | Status: AC
  Administered 2017-12-01: 2500 mg via INTRAVENOUS
  Filled 2017-12-01: qty 2000

## 2017-12-01 MED ORDER — ACETAMINOPHEN 325 MG PO TABS: 650.0000 mg | ORAL_TABLET | ORAL | Status: DC | PRN

## 2017-12-01 NOTE — Progress Notes (Signed)
CSW was informed pt admitted from [facility] where she is long term care resident. Plans to return at [place] will facilitate.  Sharren Bridge, MSW, LCSW Clinical Social Work 12/01/2017 [phone]

## 2017-12-01 NOTE — Progress Notes (Signed)
Pharmacy Antibiotic Note  Deborah [name] is a 52 y.o. female admitted on 11/28/2017 with abdominal pain, n/v. CXR today revealed new left lower lobe infiltrate. Pharmacy has been consulted for Vancomycin and Cefepime dosing. Noted penicillin allergy, but patient tolerated Ceftriaxone as well as Cephalexin in the past upon chart review.   Plan: Vancomycin 2500mg  IV x 1, then 1g IV q12h. Vancomycin peak/trough levels at steady state, as indicated. Cefepime 2g IV q8h. Monitor renal function, cultures, clinical course.   Height: 5\' 6"  (167.6 cm) Weight: (!) 313 lb 8 oz (142.2 kg) IBW/kg (Calculated) : 59.3  Temp (24hrs), Avg:97.9 F (36.6 C), Min:97.7 F (36.5 C), Max:98.2 F (36.8 C)  Recent Labs  Lab 11/28/17 1530 11/29/17 0522 11/30/17 0434 12/01/17 0352  WBC 10.8* 26.5* 23.0* 18.2*  CREATININE 1.24* 1.46* 1.75* 1.29*    Estimated Creatinine Clearance: 73.6 mL/min (A) (by C-G formula based on SCr of 1.29 mg/dL (H)).    Allergies  Allergen Reactions  . Contrast Media [Iodinated Diagnostic Agents] Anaphylaxis    Cardiac arrest  . Diazepam [name]  . Isovue [Iopamidol] Anaphylaxis    Patient had seizure like activity and then code post 100 cc of isovue 300  . Lisinopril Anaphylaxis    Tongue and mouth swelling  . Penicillins Palpitations    Has patient had a PCN reaction causing immediate rash, facial/tongue/throat swelling, SOB or lightheadedness with hypotension: Yes, heart races Has patient had a PCN reaction causing severe rash involving mucus membranes or skin necrosis: No Has patient had a PCN reaction that required hospitalization: Yes  Has patient had a PCN reaction occurring within the last 10 years: No   . Acetaminophen Nausea Only and Other (See Comments)    Irritates stomach ulcer  Abdominal pain  . Tolmetin Nausea Only    Other reaction(s): Other (See Comments) ULCER  . Aspirin Other (See Comments)    Irritates stomach ulcer   . Food  Hives and Swelling    Carrots, ketchup   . Nsaids Other (See Comments)    ULCER  . Tramadol Nausea And Vomiting    Antimicrobials this admission: 9/26 Vancomycin >>  9/26 Cefepime >>  Dose adjustments this admission: --  Microbiology results: 9/24 MRSA PCR: negative  Thank you for allowing pharmacy to be a part of this patient's care.    Lindell Spar, PharmD, BCPS Pager: [phone] 12/01/2017 2:13 PM

## 2017-12-01 NOTE — Progress Notes (Signed)
PROGRESS NOTE    Deborah [name]  XIH:[id number] DOB: Aug 24, 1965 DOA: 11/28/2017 PCP: Patient, No Pcp Per    Brief Narrative: 52 year old with history of essential hypertension, hyperlipidemia, insulin-dependent diabetes mellitus type 2 with gastroparesis came to the hospital with complaints of abdominal pain nausea and vomiting.  She has been on p.o. Reglan without any relief.  Her initial labs are unremarkable however CT of the abdomen pelvis were ordered which showed 5.2 cm left adnexal solid structure.  But sometime after IV contrast administration patient reportedly lost her pulse and ended up getting CPR along with epinephrine for 1 minute.  Assessment & Plan:   Principal Problem:   Allergic reaction to contrast media, initial encounter Active Problems:   Lower abdominal pain   Normocytic normochromic anemia   Cardiac arrest (HCC)   Pelvic mass   Leukocytosis   Anxiety   Cardiac arrest possibly secondary to an allergic reaction to contrast versus vasovagal episode Patient had epinephrine injection and circulation returned.  Echocardiogram done results reviewed.  TSH within normal limits.    Lower abdominal pain with nausea and vomiting possibly secondary to gastroparesis.  CT abdomen and pelvis negative for acute pathology.  Lipase and liver function test are normal. reglan 4 times daily.    Acute kidney injury possibly secondary to contrast.  Creatinine improved with IV fluids.  Recommend to continue with IV fluids for another 24 hours.    Type 2 diabetes mellitus CBG (last 3)  Recent Labs    11/30/17 2102 12/01/17 0727 12/01/17 1142  GLUCAP 198* 123* 140*   Resume SSI for now no change in medications.    Hypertension Well controlled.   ANXIETY:  resume Cymbalta and Xanax.     HYPERLIipidemia continue statin.  Left Side adnexal mass MRI showed its probably a fibroid.  Commend outpatient follow-up with GYN.   Leukocytosis:  Suspect  reactive.   Anemia: ? Anemia of chronic disease.  Baseline hemoglobin between 9 to 10.  Anemia panel reviewed. Drop in iron levels when compared to 2017.  Start iron supplementation on discharge.  Hemoglobin at 8.9 today.    Cough and congestion and sob, with chest heaviness. CXR showing left lower infiltrate.  Start treating as health care associated pneumonia.  Start her on vancomycin and cefepime.    Chest tenderness at the place of chest compressions: Pain control.     DVT prophylaxis: lovenox.  Code Status:  Full code.  Family Communication:  None at bedside.  Disposition Plan: pending clinical improvement.   Consultants:   None.    Procedures: none.   Antimicrobials: vancomycin and cefepime.    Subjective: Cough, congestion, sob and chest pain at the site of chest compressions.   Objective: Vitals:   11/30/17 2027 12/01/17 0625 12/01/17 0900 12/01/17 1342  BP: 128/72 135/62 (!) 141/84 (!) 148/84  Pulse: 97 93 96 93  Resp: 18 18 18 18   Temp: 97.8 F (36.6 C) 98 F (36.7 C) 97.7 F (36.5 C) 98.2 F (36.8 C)  TempSrc: Oral Oral Oral Oral  SpO2: 92% 97% 97% 99%  Weight:      Height:        Intake/Output Summary (Last 24 hours) at 12/01/2017 1407 Last data filed at 12/01/2017 1251 Gross per 24 hour  Intake 1340 ml  Output -  Net 1340 ml   Filed Weights   11/28/17 1343  Weight: (!) 142.2 kg    Examination:  General exam: in mild distress from chest pain.  Respiratory system: diminished at bases, no wheezing .  Cardiovascular system: S1 & S2 heard, RRR. No JVD, Gastrointestinal system: Abdomen is soft non tender non distended bowel sounds good.  Central nervous system: Alert and oriented. No focal neurological deficits. Extremities: Symmetric 5 x 5 power. pedal edema.  Skin: No rashes, lesions or ulcers Psychiatry:  Mood & affect appropriate.     Data Reviewed: I have personally reviewed following labs and imaging studies  CBC: Recent  Labs  Lab 11/28/17 1530 11/29/17 0522 11/30/17 0434 12/01/17 0352  WBC 10.8* 26.5* 23.0* 18.2*  HGB 10.9* 11.0* 8.5* 8.9*  HCT 33.9* 33.8* 26.7* 28.0*  MCV 89.0 89.9 89.9 90.0  PLT 444* 402* 376 032   Basic Metabolic Panel: Recent Labs  Lab 11/28/17 1530 11/29/17 0522 11/29/17 0811 11/30/17 0434 12/01/17 0352  NA 140 136  --  138 139  K 4.1 5.3*  --  4.0 3.6  CL 106 103  --  109 109  CO2 23 19*  --  21* 23  GLUCOSE 245* 435* 470* 212* 123*  BUN 24* 27*  --  35* 23*  CREATININE 1.24* 1.46*  --  1.75* 1.29*  CALCIUM 9.8 8.9  --  8.7* 9.0  MG  --  1.9  --  1.9  --    GFR: Estimated Creatinine Clearance: 73.6 mL/min (A) (by C-G formula based on SCr of 1.29 mg/dL (H)). Liver Function Tests: Recent Labs  Lab 11/28/17 1530 11/30/17 0434  AST 20 16  ALT 20 16  ALKPHOS 91 63  BILITOT 0.7 0.3  PROT 8.2* 6.4*  ALBUMIN 3.6 2.9*   Recent Labs  Lab 11/28/17 1530  LIPASE 35   No results for input(s): AMMONIA in the last 168 hours. Coagulation Profile: No results for input(s): INR, PROTIME in the last 168 hours. Cardiac Enzymes: Recent Labs  Lab 11/29/17 0204 11/29/17 0751  TROPONINI 0.06* 0.07*   BNP (last 3 results) No results for input(s): PROBNP in the last 8760 hours. HbA1C: No results for input(s): HGBA1C in the last 72 hours. CBG: Recent Labs  Lab 11/30/17 1130 11/30/17 1640 11/30/17 2102 12/01/17 0727 12/01/17 1142  GLUCAP 177* 190* 198* 123* 140*   Lipid Profile: No results for input(s): CHOL, HDL, LDLCALC, TRIG, CHOLHDL, LDLDIRECT in the last 72 hours. Thyroid Function Tests: Recent Labs    11/30/17 0434  TSH 2.465   Anemia Panel: Recent Labs    12/01/17 0352  VITAMINB12 454  FOLATE 12.9  FERRITIN 78  TIBC 317  IRON 30  RETICCTPCT 3.7*   Sepsis Labs: No results for input(s): PROCALCITON, LATICACIDVEN in the last 168 hours.  Recent Results (from the past 240 hour(s))  MRSA PCR Screening     Status: None   Collection Time:  11/29/17  1:27 AM  Result Value Ref Range Status   MRSA by PCR NEGATIVE NEGATIVE Final    Comment:        The GeneXpert MRSA Assay (FDA approved for NASAL specimens only), is one component of a comprehensive MRSA colonization surveillance program. It is not intended to diagnose MRSA infection nor to guide or monitor treatment for MRSA infections. Performed at [facility], [place] [address], [place], [place] [address]          Radiology Studies: Dg Chest 2 View  Result Date: 12/01/2017 CLINICAL DATA:  Cough and weakness EXAM: CHEST - 2 VIEW COMPARISON:  11/28/2017 FINDINGS: Cardiac shadow is mildly enlarged. The lungs are well aerated bilaterally.  Patchy infiltrate is noted projecting in the left lower lobe on the lateral film. No sizable effusion is seen. No bony abnormality is noted. IMPRESSION: Left lower lobe infiltrate new from the prior exam. Electronically Signed   By: Inez Catalina M.D.   On: 12/01/2017 12:15   Mr Pelvis Wo Contrast  Result Date: 11/29/2017 CLINICAL DATA:  Uterine mass. Renal transplant dysfunction. History of diabetes and iodinated contrast allergy. EXAM: MRI PELVIS WITHOUT CONTRAST TECHNIQUE: Multiplanar multisequence MR imaging of the pelvis was performed. No intravenous contrast was administered. COMPARISON:  Pelvic CT 11/28/2017.  Pelvic ultrasound 11/29/2017. FINDINGS: Despite efforts by the technologist and patient, mild motion artifact is present on today's exam and could not be eliminated. This reduces exam sensitivity and specificity. Urinary Tract: The visualized distal ureters and bladder appear unremarkable. There is no evidence of renal transplant on this study or yesterday CT, and the provided history appears to be in error. Bowel: No bowel wall thickening, distention or surrounding inflammation identified within the pelvis. Vascular/Lymphatic: No enlarged pelvic lymph nodes identified. No significant vascular findings. Reproductive:  Uterus: Measures 8.0 x 3.3 x 5.3 cm. There is an intramural fundal fibroid measuring 15 mm on coronal image 26/4. The lesion of concern in the left adnexa is contiguous with the uterus and measures 4.9 x 4.2 x 5.0 cm. This demonstrates low T2 signal and is most consistent with an exophytic fibroid. There is another adjacent smaller fibroid measuring 17 mm on coronal image 22. Endometrium:  Unremarkable. Cervix/Vagina: Small cervical nabothian cysts. The vagina appears unremarkable. Right ovary:  Appears normal.  No mass identified. Left ovary: Abuts the left adnexal lesion, although appears separate from it. Other: No ascites. Musculoskeletal: No acute or worrisome osseous findings. IMPRESSION: 1. The lesion of concern in the left adnexa abuts the uterus, has a broad based connection and low T2 signal, most consistent with an exophytic fibroid. There are additional smaller intramural uterine fibroids. 2. No suspicious adnexal findings. Electronically Signed   By: Richardean Sale M.D.   On: 11/29/2017 15:19        Scheduled Meds: . amLODipine  10 mg Oral Daily  . atorvastatin  10 mg Oral q1800  . DULoxetine  30 mg Oral BID  . enoxaparin (LOVENOX) injection  60 mg Subcutaneous QHS  . insulin aspart  0-15 Units Subcutaneous TID WC  . insulin glargine  40 Units Subcutaneous QHS  . loratadine  10 mg Oral Daily  . mouth rinse  15 mL Mouth Rinse BID  . Melatonin  6 mg Oral QHS  . metoCLOPramide  10 mg Oral TID AC & HS  . metoprolol succinate  25 mg Oral Daily  . pantoprazole  40 mg Oral Daily   Continuous Infusions: . sodium chloride 100 mL/hr at 12/01/17 0600  . ceFEPime (MAXIPIME) IV    . vancomycin       LOS: 2 days    Time spent: 35 minutes.     Hosie Poisson, MD [facility] Pager [phone]   If 7PM-7AM, please contact night-coverage [email or web address] Password [email or web address] 12/01/2017, 2:07 PM

## 2017-12-01 NOTE — NC FL2 (Signed)
[place] MEDICAID FL2 LEVEL OF CARE SCREENING TOOL     IDENTIFICATION  Patient Name: Deborah [name] Birthdate: 07/27/1965 Sex: female Admission Date (Current Location): 11/28/2017  [facility] and [place] Number:  Herbalist and Address:  [facility],  [place] [address], [place]      Provider Number: [id number]  Attending Physician Name and Address:  Hosie Poisson, MD  Relative Name and Phone Number:       Current Level of Care: Hospital Recommended Level of Care: [place] Prior Approval Number:    Date Approved/Denied:   PASRR Number: [id number]  Discharge Plan: SNF    Current Diagnoses: Patient Active Problem List   Diagnosis Date Noted  . Cardiac arrest ([place]) 11/29/2017  . Pelvic mass 11/29/2017  . Leukocytosis 11/29/2017  . Anxiety 11/29/2017  . Allergic reaction to contrast media, initial encounter 11/28/2017  . Palliative care encounter   . Back pain 03/19/2017  . Stroke (cerebrum) ([place]) 03/19/2017  . GERD (gastroesophageal reflux disease) 03/19/2017  . Depression 03/19/2017  . Obesity   . Urinary tract infection 08/16/2016  . Normocytic normochromic anemia 08/16/2016  . Gastroparesis 08/16/2016  . Intractable nausea and vomiting 06/17/2016  . Diabetic gastroparesis ([place]) 06/05/2016  . Gout 06/05/2016  . AKI (acute kidney injury) ([place]) 12/06/2015  . Hypokalemia 09/26/2015  . Hypomagnesemia 09/26/2015  . Nausea and vomiting 08/20/2015  . Gout flare 05/27/2015  . Lower abdominal pain 05/26/2015  . DKA (diabetic ketoacidoses) ([place]) 05/25/2015  . Uncontrolled type 2 diabetes mellitus with diabetic neuropathy, with long-term current use of insulin ([place]) 05/25/2015  . Dyslipidemia associated with type 2 diabetes mellitus ([place]) 05/25/2015  . CKD (chronic kidney disease), stage II 05/25/2015  . Essential hypertension, benign 09/28/2013    Orientation RESPIRATION BLADDER Height & Weight     Self, Time,  Situation, Place  Normal Incontinent Weight: (!) 313 lb 8 oz (142.2 kg) Height:  5\' 6"  (167.6 cm)  BEHAVIORAL SYMPTOMS/MOOD NEUROLOGICAL BOWEL NUTRITION STATUS      Incontinent Diet(heart healthy carb modified diet)  AMBULATORY STATUS COMMUNICATION OF NEEDS Skin   Extensive Assist Verbally Normal                       Personal Care Assistance Level of Assistance  Bathing, Feeding, Dressing Bathing Assistance: Maximum assistance Feeding assistance: Independent Dressing Assistance: Limited assistance     Functional Limitations Info  Sight, Hearing, Speech Sight Info: Adequate Hearing Info: Adequate Speech Info: Adequate    SPECIAL CARE FACTORS FREQUENCY  PT (By licensed PT)     PT Frequency: 2x              Contractures Contractures Info: Not present    Additional Factors Info  Code Status, Allergies Code Status Info: full code Allergies Info: Contrast Media Iodinated Diagnostic Agents, Diazepam, Isovue Iopamidol, Lisinopril, Penicillins, Acetaminophen, Tolmetin, Aspirin, Food, Nsaids, Tramadol           Current Medications (12/01/2017):  This is the current hospital active medication list Current Facility-Administered Medications  Medication Dose Route Frequency Provider Last Rate Last Dose  . 0.9 %  sodium chloride infusion   Intravenous Continuous Tamala Julian, Rondell A, MD 100 mL/hr at 12/01/17 1513    . acetaminophen (TYLENOL) tablet 650 mg  650 mg Oral Q4H PRN Hosie Poisson, MD      . ALPRAZolam Duanne Moron) tablet 0.25 mg  0.25 mg Oral BID PRN Norval Morton, MD   0.25  mg at 11/29/17 1319  . amLODipine (NORVASC) tablet 10 mg  10 mg Oral Daily Fuller Plan A, MD   10 mg at 12/01/17 0913  . atorvastatin (LIPITOR) tablet 10 mg  10 mg Oral q1800 Fuller Plan A, MD   10 mg at 11/30/17 1714  . ceFEPIme (MAXIPIME) 2 g in sodium chloride 0.9 % 100 mL IVPB  2 g Intravenous Q8H Luiz Ochoa, RPH 200 mL/hr at 12/01/17 1516 2 g at 12/01/17 1516  . DULoxetine (CYMBALTA)  DR capsule 30 mg  30 mg Oral BID Fuller Plan A, MD   30 mg at 12/01/17 0912  . enoxaparin (LOVENOX) injection 60 mg  60 mg Subcutaneous QHS Fuller Plan A, MD   60 mg at 11/30/17 2156  . guaiFENesin (ROBITUSSIN) 100 MG/5ML solution 100 mg  5 mL Oral Q4H PRN Hosie Poisson, MD   100 mg at 12/01/17 0908  . insulin aspart (novoLOG) injection 0-15 Units  0-15 Units Subcutaneous TID WC Norval Morton, MD   2 Units at 12/01/17 1302  . insulin glargine (LANTUS) injection 40 Units  40 Units Subcutaneous QHS Norval Morton, MD   40 Units at 11/30/17 2156  . loratadine (CLARITIN) tablet 10 mg  10 mg Oral Daily Fuller Plan A, MD   10 mg at 12/01/17 0913  . MEDLINE mouth rinse  15 mL Mouth Rinse BID Fuller Plan A, MD   15 mL at 11/30/17 2157  . Melatonin TABS 6 mg  6 mg Oral QHS Fuller Plan A, MD   6 mg at 11/30/17 2157  . metoCLOPramide (REGLAN) tablet 10 mg  10 mg Oral TID AC & HS Smith, Rondell A, MD   10 mg at 12/01/17 1302  . metoprolol succinate (TOPROL-XL) 24 hr tablet 25 mg  25 mg Oral Daily Tamala Julian, Rondell A, MD   25 mg at 12/01/17 0913  . ondansetron (ZOFRAN) injection 4 mg  4 mg Intravenous Q6H PRN Schorr, Rhetta Mura, NP   4 mg at 11/29/17 0248  . pantoprazole (PROTONIX) EC tablet 40 mg  40 mg Oral Daily Fuller Plan A, MD   40 mg at 12/01/17 0913  . vancomycin (VANCOCIN) 2,500 mg in sodium chloride 0.9 % 500 mL IVPB  2,500 mg Intravenous Once Luiz Ochoa, [place] [facility]      . [START ON 12/02/2017] vancomycin (VANCOCIN) IVPB 1000 mg/200 mL premix  1,000 mg Intravenous Q12H Luiz Ochoa, [facility]         Discharge Medications: Please see discharge summary for a list of discharge medications.  Relevant Imaging Results:  Relevant Lab Results:   Additional Information SS#: [id number]  Colquitt Layton, [place]

## 2017-12-01 NOTE — Progress Notes (Addendum)
Pt will need to call [facility] for an appointment on Monday.  Pt made aware.

## 2017-12-02 DIAGNOSIS — D649 Anemia, unspecified: Secondary | ICD-10-CM

## 2017-12-02 DIAGNOSIS — D72829 Elevated white blood cell count, unspecified: Secondary | ICD-10-CM

## 2017-12-02 LAB — CBC WITH DIFFERENTIAL/PLATELET
BASOS ABS: 0 10*3/uL (ref 0.0–0.1)
BASOS PCT: 0 %
EOS ABS: 0.3 10*3/uL (ref 0.0–0.7)
EOS PCT: 3 %
HCT: 27.1 % — ABNORMAL LOW (ref 36.0–46.0)
Hemoglobin: 8.8 g/dL — ABNORMAL LOW (ref 12.0–15.0)
Lymphocytes Relative: 28 %
Lymphs Abs: 2.9 10*3/uL (ref 0.7–4.0)
MCH: 28.7 pg (ref 26.0–34.0)
MCHC: 32.5 g/dL (ref 30.0–36.0)
MCV: 88.3 fL (ref 78.0–100.0)
MONO ABS: 0.4 10*3/uL (ref 0.1–1.0)
MONOS PCT: 4 %
NEUTROS ABS: 6.8 10*3/uL (ref 1.7–7.7)
Neutrophils Relative %: 65 %
PLATELETS: 310 10*3/uL (ref 150–400)
RBC: 3.07 MIL/uL — ABNORMAL LOW (ref 3.87–5.11)
RDW: 13.6 % (ref 11.5–15.5)
WBC: 10.4 10*3/uL (ref 4.0–10.5)

## 2017-12-02 LAB — GLUCOSE, CAPILLARY
GLUCOSE-CAPILLARY: 143 mg/dL — AB (ref 70–99)
Glucose-Capillary: 236 mg/dL — ABNORMAL HIGH (ref 70–99)

## 2017-12-02 LAB — CREATININE, SERUM
Creatinine, Ser: 1.28 mg/dL — ABNORMAL HIGH (ref 0.44–1.00)
GFR, EST AFRICAN AMERICAN: 54 mL/min — AB (ref >60)
GFR, EST NON AFRICAN AMERICAN: 47 mL/min — AB (ref >60)

## 2017-12-02 MED ORDER — METRONIDAZOLE 500 MG PO TABS
500.0000 mg | ORAL_TABLET | Freq: Three times a day (TID) | ORAL | Status: DC
  Administered 2017-12-02: 500 mg via ORAL
  Filled 2017-12-02: qty 1

## 2017-12-02 MED ORDER — CEFDINIR 300 MG PO CAPS: 300.0000 mg | ORAL_CAPSULE | Freq: Two times a day (BID) | ORAL | 0 refills | Status: DC

## 2017-12-02 MED ORDER — CEFDINIR 300 MG PO CAPS
300.0000 mg | ORAL_CAPSULE | Freq: Two times a day (BID) | ORAL | Status: DC
  Administered 2017-12-02: 300 mg via ORAL
  Filled 2017-12-02: qty 1

## 2017-12-02 MED ORDER — METRONIDAZOLE 500 MG PO TABS: 500.0000 mg | ORAL_TABLET | Freq: Three times a day (TID) | ORAL | 0 refills | Status: DC

## 2017-12-02 MED ORDER — GUAIFENESIN 100 MG/5ML PO SOLN: 5.0000 mL | ORAL | 0 refills | Status: DC | PRN

## 2017-12-02 MED ORDER — ACETAMINOPHEN 325 MG PO TABS: 650.0000 mg | ORAL_TABLET | ORAL | Status: DC | PRN

## 2017-12-02 NOTE — Progress Notes (Signed)
Pt returning to [facility] SNF report #[id number] room # 239-South. Arranged PTAR transportation and provided DC information to facility. Pt informed her mother of DC.  Sharren Bridge, MSW, LCSW Clinical Social Work 12/02/2017 [phone]

## 2017-12-02 NOTE — Progress Notes (Signed)
PT Cancellation Note / Screen  Patient Details Name: Deborah [name] MRN: [id number] DOB: [birth year]   Cancelled Treatment:    Reason Eval/Treat Not Completed: PT screened, no needs identified, will sign off Pt sleeping this morning and reports she is going home this afternoon.  CSW states no PT note needed.  Pt to d/c back to SNF today.   Rolando Hessling,KATHrine E 12/02/2017, 2:07 PM Carmelia Bake, PT, DPT Acute Rehabilitation Services Office: [phone] Pager: [phone]

## 2017-12-02 NOTE — Discharge Summary (Signed)
Physician Discharge Summary  AMARIZ FLAMENCO BJS:[id number] DOB: 08-13-1965 DOA: 11/28/2017  PCP: Patient, No Pcp Per  Admit date: 11/28/2017 Discharge date: 12/02/2017  Admitted From: SNF Disposition: SNF   Recommendations for Outpatient Follow-up:  1. Follow up with PCP in 1-2 weeks 2. Please obtain BMP/CBC in one week Please follow up WITH A repeat CXR in 4 weeks.   Discharge Condition:stable CODE STATUS: full code.  Diet recommendation: Heart Healthy   Brief/Interim Summary: 52 year old with history of essential hypertension, hyperlipidemia, insulin-dependent diabetes mellitus type 2 with gastroparesis came to the hospital with complaints of abdominal pain nausea and vomiting. She has been on p.o. Reglan without any relief. Her initial labs are unremarkable however CT of the abdomen pelvis were ordered which showed 5.2 cm left adnexal solid structure. But sometime after IV contrast administration patient reportedly lost her pulse and ended up getting CPR along with epinephrine for 1 minute.  Discharge Diagnoses:  Principal Problem:   Allergic reaction to contrast media, initial encounter Active Problems:   Lower abdominal pain   Normocytic normochromic anemia   Cardiac arrest (HCC)   Pelvic mass   Leukocytosis   Anxiety  Cardiac arrest possibly secondary to an allergic reaction to contrast versus vasovagal episode Patient had epinephrine injection and circulation returned.  Echocardiogram done results reviewed.  TSH within normal limits.    Lower abdominal pain with nausea and vomiting possibly secondary to gastroparesis.  CT abdomen and pelvis negative for acute pathology.  Lipase and liver function test are normal. reglan 4 times daily.    Acute kidney injury possibly secondary to contrast.  Creatinine improved with IV fluids.     Type 2 diabetes mellitus CBG (last 3)  Recent Labs    12/01/17 2123 12/02/17 0718 12/02/17 1158  GLUCAP 247* 236*  143*   Resume home meds.     Hypertension Well controlled.   ANXIETY:  resume Cymbalta and Xanax.   HYPERLIipidemia continue statin.  Left Side adnexal mass MRI showed its probably a fibroid.  Commend outpatient follow-up with GYN.   Anemia: ? Anemia of chronic disease.  Baseline hemoglobin between 9 to 10.  Anemia panel reviewed. Drop in iron levels when compared to 2017.  Start iron supplementation on discharge.  Hemoglobin at 8.9 today.    Cough and congestion and sob, with chest heaviness. CXR showing left lower infiltrate.  Started her on vancomycin and cefepime. Fever resolved, wbc count normalized, and she is on RA with good sats. She will be discharged on oral cefdinir and flagyl.    Chest tenderness at the place of chest compressions: Pain control.      Discharge Instructions  Discharge Instructions    Diet - low sodium heart healthy   Complete by:  As directed    Discharge instructions   Complete by:  As directed    FOLLOW UP WITH PCP in one week.     Allergies as of 12/02/2017      Reactions   Contrast Media [iodinated Diagnostic Agents] Anaphylaxis   Cardiac arrest   Diazepam [name]   Isovue [iopamidol] Anaphylaxis   Patient had seizure like activity and then code post 100 cc of isovue 300   Lisinopril Anaphylaxis   Tongue and mouth swelling   Penicillins Palpitations   Has patient had a PCN reaction causing immediate rash, facial/tongue/throat swelling, SOB or lightheadedness with hypotension: Yes, heart races Has patient had a PCN reaction causing severe rash involving mucus membranes or skin necrosis:  No Has patient had a PCN reaction that required hospitalization: Yes  Has patient had a PCN reaction occurring within the last 10 years: No   Acetaminophen Nausea Only, Other (See Comments)   Irritates stomach ulcer  Abdominal pain   Tolmetin Nausea Only   Other reaction(s): Other (See Comments) ULCER   Aspirin  Other (See Comments)   Irritates stomach ulcer    Food Hives, Swelling   Carrots, ketchup    Nsaids Other (See Comments)   ULCER   Tramadol Nausea And Vomiting      Medication List    STOP taking these medications   dicyclomine 10 MG capsule Commonly known as:  BENTYL   hyoscyamine 0.125 MG SL tablet Commonly known as:  LEVSIN SL   Magnesium Oxide 400 (240 Mg) MG Tabs   sucralfate 1 g tablet Commonly known as:  CARAFATE     TAKE these medications   acetaminophen 325 MG tablet Commonly known as:  TYLENOL Take 2 tablets (650 mg total) by mouth every 4 (four) hours as needed for fever, headache or mild pain.   allopurinol 100 MG tablet Commonly known as:  ZYLOPRIM Take 1 tablet (100 mg total) by mouth daily.   ALPRAZolam 0.25 MG tablet Commonly known as:  XANAX Take 0.25 mg by mouth 2 (two) times daily as needed for anxiety.   amLODipine 10 MG tablet Commonly known as:  NORVASC Take 1 tablet (10 mg total) by mouth daily.   atorvastatin 10 MG tablet Commonly known as:  LIPITOR Take 10 mg by mouth daily.   cefdinir 300 MG capsule Commonly known as:  OMNICEF Take 1 capsule (300 mg total) by mouth every 12 (twelve) hours.   cetirizine 10 MG tablet Commonly known as:  ZYRTEC Take 10 mg by mouth daily.   Cyanocobalamin 1000 MCG/ML Kit Inject 1,000 mcg as directed every 30 (thirty) days.   DULoxetine 30 MG capsule Commonly known as:  CYMBALTA Take 1 capsule (30 mg total) by mouth 2 (two) times daily.   ECOTRIN LOW STRENGTH 81 MG EC tablet Generic drug:  aspirin Take 81 mg by mouth daily. Swallow whole.   guaiFENesin 100 MG/5ML Soln Commonly known as:  ROBITUSSIN Take 5 mLs (100 mg total) by mouth every 4 (four) hours as needed for cough or to loosen phlegm.   insulin lispro 100 UNIT/ML injection Commonly known as:  HUMALOG Inject 10 Units into the skin 3 (three) times daily.   LANTUS 100 UNIT/ML injection Generic drug:  insulin glargine Inject 40 Units  into the skin at bedtime.   Melatonin 3 MG Tabs Take 6 mg by mouth at bedtime.   methocarbamol 500 MG tablet Commonly known as:  ROBAXIN Take 500 mg by mouth 2 (two) times daily.   metoCLOPramide 10 MG tablet Commonly known as:  REGLAN Take 1 tablet (10 mg total) by mouth 3 (three) times daily before meals. What changed:  when to take this   metoprolol succinate 25 MG 24 hr tablet Commonly known as:  TOPROL-XL Take 1 tablet (25 mg total) by mouth daily.   metroNIDAZOLE 500 MG tablet Commonly known as:  FLAGYL Take 1 tablet (500 mg total) by mouth every 8 (eight) hours.   multivitamin with minerals Tabs tablet Take 1 tablet by mouth daily.   nitroGLYCERIN 0.4 MG SL tablet Commonly known as:  NITROSTAT Place 0.4 mg under the tongue every 5 (five) minutes as needed for chest pain.   ondansetron 4 MG disintegrating tablet Commonly  known as:  ZOFRAN-ODT Take 4 mg by mouth every 6 (six) hours as needed for nausea.   pantoprazole 40 MG tablet Commonly known as:  PROTONIX Take 1 tablet (40 mg total) by mouth 2 (two) times daily. What changed:  when to take this   potassium chloride 10 MEQ tablet Commonly known as:  K-DUR Take 2 tablets (20 mEq total) by mouth daily for 7 days.       Contact information for follow-up providers    [facility].   Specialty:  Internal Medicine Why:  Call Monday for an appointment. [phone] Contact information: [facility].   Why:  Please call for a hospital follow up or call patient care center on Monday. Contact information: [address] [place] [address] [phone]           Contact information for after-discharge care    [facility] .   Service:  Skilled Nursing Contact information: [place]  [phone]                 Allergies  Allergen Reactions  . Contrast Media [Iodinated Diagnostic Agents] Anaphylaxis    Cardiac arrest  . Diazepam [name]  . Isovue [Iopamidol] Anaphylaxis    Patient had seizure like activity and then code post 100 cc of isovue 300  . Lisinopril Anaphylaxis    Tongue and mouth swelling  . Penicillins Palpitations    Has patient had a PCN reaction causing immediate rash, facial/tongue/throat swelling, SOB or lightheadedness with hypotension: Yes, heart races Has patient had a PCN reaction causing severe rash involving mucus membranes or skin necrosis: No Has patient had a PCN reaction that required hospitalization: Yes  Has patient had a PCN reaction occurring within the last 10 years: No   . Acetaminophen Nausea Only and Other (See Comments)    Irritates stomach ulcer  Abdominal pain  . Tolmetin Nausea Only    Other reaction(s): Other (See Comments) ULCER  . Aspirin Other (See Comments)    Irritates stomach ulcer   . Food Hives and Swelling    Carrots, ketchup   . Nsaids Other (See Comments)    ULCER  . Tramadol Nausea And Vomiting    Consultations:  NONE.    Procedures/Studies: Dg Chest 2 View  Result Date: 12/01/2017 CLINICAL DATA:  Cough and weakness EXAM: CHEST - 2 VIEW COMPARISON:  11/28/2017 FINDINGS: Cardiac shadow is mildly enlarged. The lungs are well aerated bilaterally. Patchy infiltrate is noted projecting in the left lower lobe on the lateral film. No sizable effusion is seen. No bony abnormality is noted. IMPRESSION: Left lower lobe infiltrate new from the prior exam. Electronically Signed   By: Inez Catalina M.D.   On: 12/01/2017 12:15   Mr Pelvis Wo Contrast  Result Date: 11/29/2017 CLINICAL DATA:  Uterine mass. Renal transplant dysfunction. History of diabetes and iodinated contrast allergy. EXAM: MRI PELVIS WITHOUT CONTRAST TECHNIQUE: Multiplanar multisequence MR imaging of the pelvis was  performed. No intravenous contrast was administered. COMPARISON:  Pelvic CT 11/28/2017.  Pelvic ultrasound 11/29/2017. FINDINGS: Despite efforts by the technologist and patient, mild motion artifact is present on today's exam and could not be eliminated. This reduces exam sensitivity and specificity. Urinary Tract: The visualized distal ureters and bladder appear  unremarkable. There is no evidence of renal transplant on this study or yesterday CT, and the provided history appears to be in error. Bowel: No bowel wall thickening, distention or surrounding inflammation identified within the pelvis. Vascular/Lymphatic: No enlarged pelvic lymph nodes identified. No significant vascular findings. Reproductive: Uterus: Measures 8.0 x 3.3 x 5.3 cm. There is an intramural fundal fibroid measuring 15 mm on coronal image 26/4. The lesion of concern in the left adnexa is contiguous with the uterus and measures 4.9 x 4.2 x 5.0 cm. This demonstrates low T2 signal and is most consistent with an exophytic fibroid. There is another adjacent smaller fibroid measuring 17 mm on coronal image 22. Endometrium:  Unremarkable. Cervix/Vagina: Small cervical nabothian cysts. The vagina appears unremarkable. Right ovary:  Appears normal.  No mass identified. Left ovary: Abuts the left adnexal lesion, although appears separate from it. Other: No ascites. Musculoskeletal: No acute or worrisome osseous findings. IMPRESSION: 1. The lesion of concern in the left adnexa abuts the uterus, has a broad based connection and low T2 signal, most consistent with an exophytic fibroid. There are additional smaller intramural uterine fibroids. 2. No suspicious adnexal findings. Electronically Signed   By: Richardean Sale M.D.   On: 11/29/2017 15:19   US Pelvis Transvanginal Non-ob (tv Only)  Result Date: 11/29/2017 CLINICAL DATA:  52 year old female with left adnexal mass on CT. EXAM: TRANSABDOMINAL AND TRANSVAGINAL ULTRASOUND OF PELVIS TECHNIQUE: Both  transabdominal and transvaginal ultrasound examinations of the pelvis were performed. Transabdominal technique was performed for global imaging of the pelvis including uterus, ovaries, adnexal regions, and pelvic cul-de-sac. It was necessary to proceed with endovaginal exam following the transabdominal exam to visualize the endometrium and ovaries. COMPARISON:  CT of the abdomen pelvis dated 11/28/2017 FINDINGS: Evaluation is very limited due to patient's body habitus. Uterus Measurements: 7.5 x 3.9 x 3.3 cm. The uterus is anteverted. The uterus is suboptimally visualized and with limited evaluation. Endometrium Thickness: 3 mm. The endometrium is poorly visualized and suboptimally evaluated. Right ovary Not visualized Left ovary The left ovary is not visualized. There is a 5.8 x 4.5 x 4.6 cm echogenic structure in the region of the left adnexa likely corresponding to the lesion seen on the earlier CT. This is not characterized but may represent a dermoid or less likely an endometrioma. Other etiologies are not excluded. Further characterization with MRI without and with contrast is recommended. Other findings No abnormal free fluid. IMPRESSION: Echogenic lesion in the left adnexa may represent a dermoid, or endometrioma. Other etiologies are not excluded. Further evaluation with MRI is recommended. Electronically Signed   By: Anner Crete M.D.   On: 11/29/2017 02:05   Ct Abdomen Pelvis W Contrast  Result Date: 11/28/2017 CLINICAL DATA:  52 year old with abdominal pain. EXAM: CT ABDOMEN AND PELVIS WITH CONTRAST TECHNIQUE: Multidetector CT imaging of the abdomen and pelvis was performed using the standard protocol following bolus administration of intravenous contrast. CONTRAST:  161m ISOVUE-300 IOPAMIDOL (ISOVUE-300) INJECTION 61% COMPARISON:  Chest CT 04/20/2016 FINDINGS: Lower chest: Lung bases are clear. Hepatobiliary: Cholecystectomy. Normal appearance of the liver. Portal venous system is patent. No  suspicious liver lesions. No biliary dilatation. Pancreas: Unremarkable. No pancreatic ductal dilatation or surrounding inflammatory changes. Spleen: Normal in size without focal abnormality. Adrenals/Urinary Tract: Normal adrenals. Urinary bladder is unremarkable. No hydronephrosis and no suspicious renal lesions. However, there is no evidence for contrast excretion on the 2 minute delayed renal images. Stomach/Bowel: Normal appearance of the stomach and duodenum. Normal appearance  of small bowel without obstruction. No acute abnormality to the colon. There is a normal appendix. Vascular/Lymphatic: Aortic calcifications without aneurysm. No significant lymph node enlargement in the abdomen or pelvis. Reproductive: There is a rounded solid structure in the left adnexal region that measures 4.3 x 4.6 x 5.2 cm. Structure has similar density to the adjacent uterus on this post contrast examination. Normal appearance of the right ovary/adnexa region. Other: No free fluid.  Negative for free air. Musculoskeletal: Degenerative facet disease in lower lumbar spine. Disc space narrowing and large anterior bridging osteophytes at L5-S1. IMPRESSION: 1. Indeterminate 5.2 cm solid structure in the left adnexa region. Differential diagnosis includes a left ovarian/adnexal mass versus a pedunculated uterine fibroid. Recommend further characterization with a pelvic ultrasound. 2. No excretion of iodinated contrast on the 2 minute delayed renal images. Findings can be associated with renal dysfunction and recommend follow-up metabolic panel to assess kidney function. Electronically Signed   By: Markus Daft M.D.   On: 11/28/2017 22:22   Dg Chest Port 1 View  Result Date: 11/28/2017 CLINICAL DATA:  CPR. EXAM: PORTABLE CHEST 1 VIEW COMPARISON:  None. FINDINGS: The heart size and mediastinal contours are within normal limits. Both lungs are clear. The visualized skeletal structures are unremarkable. IMPRESSION: Clear lungs.  Electronically Signed   By: Ulyses Jarred M.D.   On: 11/28/2017 22:46       Subjective: Chest pain improved. Good sats on RA.   Discharge Exam: Vitals:   12/02/17 0432 12/02/17 1328  BP: 129/65 127/73  Pulse: 92 96  Resp: 18 19  Temp: 98.3 F (36.8 C) 98.7 F (37.1 C)  SpO2: 95% 96%   Vitals:   12/01/17 1342 12/01/17 2122 12/02/17 0432 12/02/17 1328  BP: (!) 148/84 (!) 161/83 129/65 127/73  Pulse: 93 94 92 96  Resp: '18 18 18 19  ' Temp: 98.2 F (36.8 C) (!) 97.4 F (36.3 C) 98.3 F (36.8 C) 98.7 F (37.1 C)  TempSrc: Oral Oral Oral Oral  SpO2: 99% 97% 95% 96%  Weight:      Height:        General: Pt is alert, awake, not in acute distress Cardiovascular: RRR, S1/S2 +, no rubs, no gallops Respiratory: CTA bilaterally, no wheezing, no rhonchi Abdominal: Soft, NT, ND, bowel sounds + Extremities: no edema, no cyanosis    The results of significant diagnostics from this hospitalization (including imaging, microbiology, ancillary and laboratory) are listed below for reference.     Microbiology: Recent Results (from the past 240 hour(s))  MRSA PCR Screening     Status: None   Collection Time: 11/29/17  1:27 AM  Result Value Ref Range Status   MRSA by PCR NEGATIVE NEGATIVE Final    Comment:        The GeneXpert MRSA Assay (FDA approved for NASAL specimens only), is one component of a comprehensive MRSA colonization surveillance program. It is not intended to diagnose MRSA infection nor to guide or monitor treatment for MRSA infections. Performed at [facility], [place] [address] [place], [place] [address]      Labs: BNP (last 3 results) Recent Labs    03/19/17 1056  BNP 56.3   Basic Metabolic Panel: Recent Labs  Lab 11/28/17 1530 11/29/17 0522 11/29/17 0811 11/30/17 0434 12/01/17 0352 12/02/17 0428  NA 140 136  --  138 139  --   K 4.1 5.3*  --  4.0 3.6  --   CL 106 103  --  109  109  --   CO2 23 19*  --  21* 23  --   GLUCOSE  245* 435* 470* 212* 123*  --   BUN 24* 27*  --  35* 23*  --   CREATININE 1.24* 1.46*  --  1.75* 1.29* 1.28*  CALCIUM 9.8 8.9  --  8.7* 9.0  --   MG  --  1.9  --  1.9  --   --    Liver Function Tests: Recent Labs  Lab 11/28/17 1530 11/30/17 0434  AST 20 16  ALT 20 16  ALKPHOS 91 63  BILITOT 0.7 0.3  PROT 8.2* 6.4*  ALBUMIN 3.6 2.9*   Recent Labs  Lab 11/28/17 1530  LIPASE 35   No results for input(s): AMMONIA in the last 168 hours. CBC: Recent Labs  Lab 11/28/17 1530 11/29/17 0522 11/30/17 0434 12/01/17 0352 12/02/17 0840  WBC 10.8* 26.5* 23.0* 18.2* 10.4  NEUTROABS  --   --   --   --  6.8  HGB 10.9* 11.0* 8.5* 8.9* 8.8*  HCT 33.9* 33.8* 26.7* 28.0* 27.1*  MCV 89.0 89.9 89.9 90.0 88.3  PLT 444* 402* 376 387 310   Cardiac Enzymes: Recent Labs  Lab 11/29/17 0204 11/29/17 0751  TROPONINI 0.06* 0.07*   BNP: Invalid input(s): POCBNP CBG: Recent Labs  Lab 12/01/17 1142 12/01/17 1642 12/01/17 2123 12/02/17 0718 12/02/17 1158  GLUCAP 140* 263* 247* 236* 143*   D-Dimer No results for input(s): DDIMER in the last 72 hours. Hgb A1c No results for input(s): HGBA1C in the last 72 hours. Lipid Profile No results for input(s): CHOL, HDL, LDLCALC, TRIG, CHOLHDL, LDLDIRECT in the last 72 hours. Thyroid function studies Recent Labs    11/30/17 0434  TSH 2.465   Anemia work up Recent Labs    12/01/17 0352  VITAMINB12 454  FOLATE 12.9  FERRITIN 78  TIBC 317  IRON 30  RETICCTPCT 3.7*   Urinalysis    Component Value Date/Time   COLORURINE YELLOW 11/28/2017 [place] 11/28/2017 1530   LABSPEC 1.016 11/28/2017 1530   PHURINE 5.0 11/28/2017 1530   GLUCOSEU 50 (A) 11/28/2017 1530   HGBUR MODERATE (A) 11/28/2017 1530   BILIRUBINUR NEGATIVE 11/28/2017 1530   KETONESUR NEGATIVE 11/28/2017 1530   PROTEINUR 100 (A) 11/28/2017 1530   UROBILINOGEN 0.2 10/02/2013 2108   NITRITE NEGATIVE 11/28/2017 1530   LEUKOCYTESUR NEGATIVE 11/28/2017 1530    Sepsis Labs Invalid input(s): PROCALCITONIN,  WBC,  LACTICIDVEN Microbiology Recent Results (from the past 240 hour(s))  MRSA PCR Screening     Status: None   Collection Time: 11/29/17  1:27 AM  Result Value Ref Range Status   MRSA by PCR NEGATIVE NEGATIVE Final    Comment:        The GeneXpert MRSA Assay (FDA approved for NASAL specimens only), is one component of a comprehensive MRSA colonization surveillance program. It is not intended to diagnose MRSA infection nor to guide or monitor treatment for MRSA infections. Performed at [facility], [place] [address], [place], [place] [address]      Time coordinating discharge: 35 minutes  SIGNED:   Hosie Poisson, MD  [facility] 12/02/2017, 1:43 PM Pager   If 7PM-7AM, please contact night-coverage [email or web address] Password [email or web address]

## 2017-12-12 IMAGING — DX DG CHEST 2V
2 series · 2 of 2 positions shown · non-contrast
Comparison: None.

CLINICAL DATA: Chest pain since yesterday.

EXAM:
CHEST  2 VIEW

[chest lat]
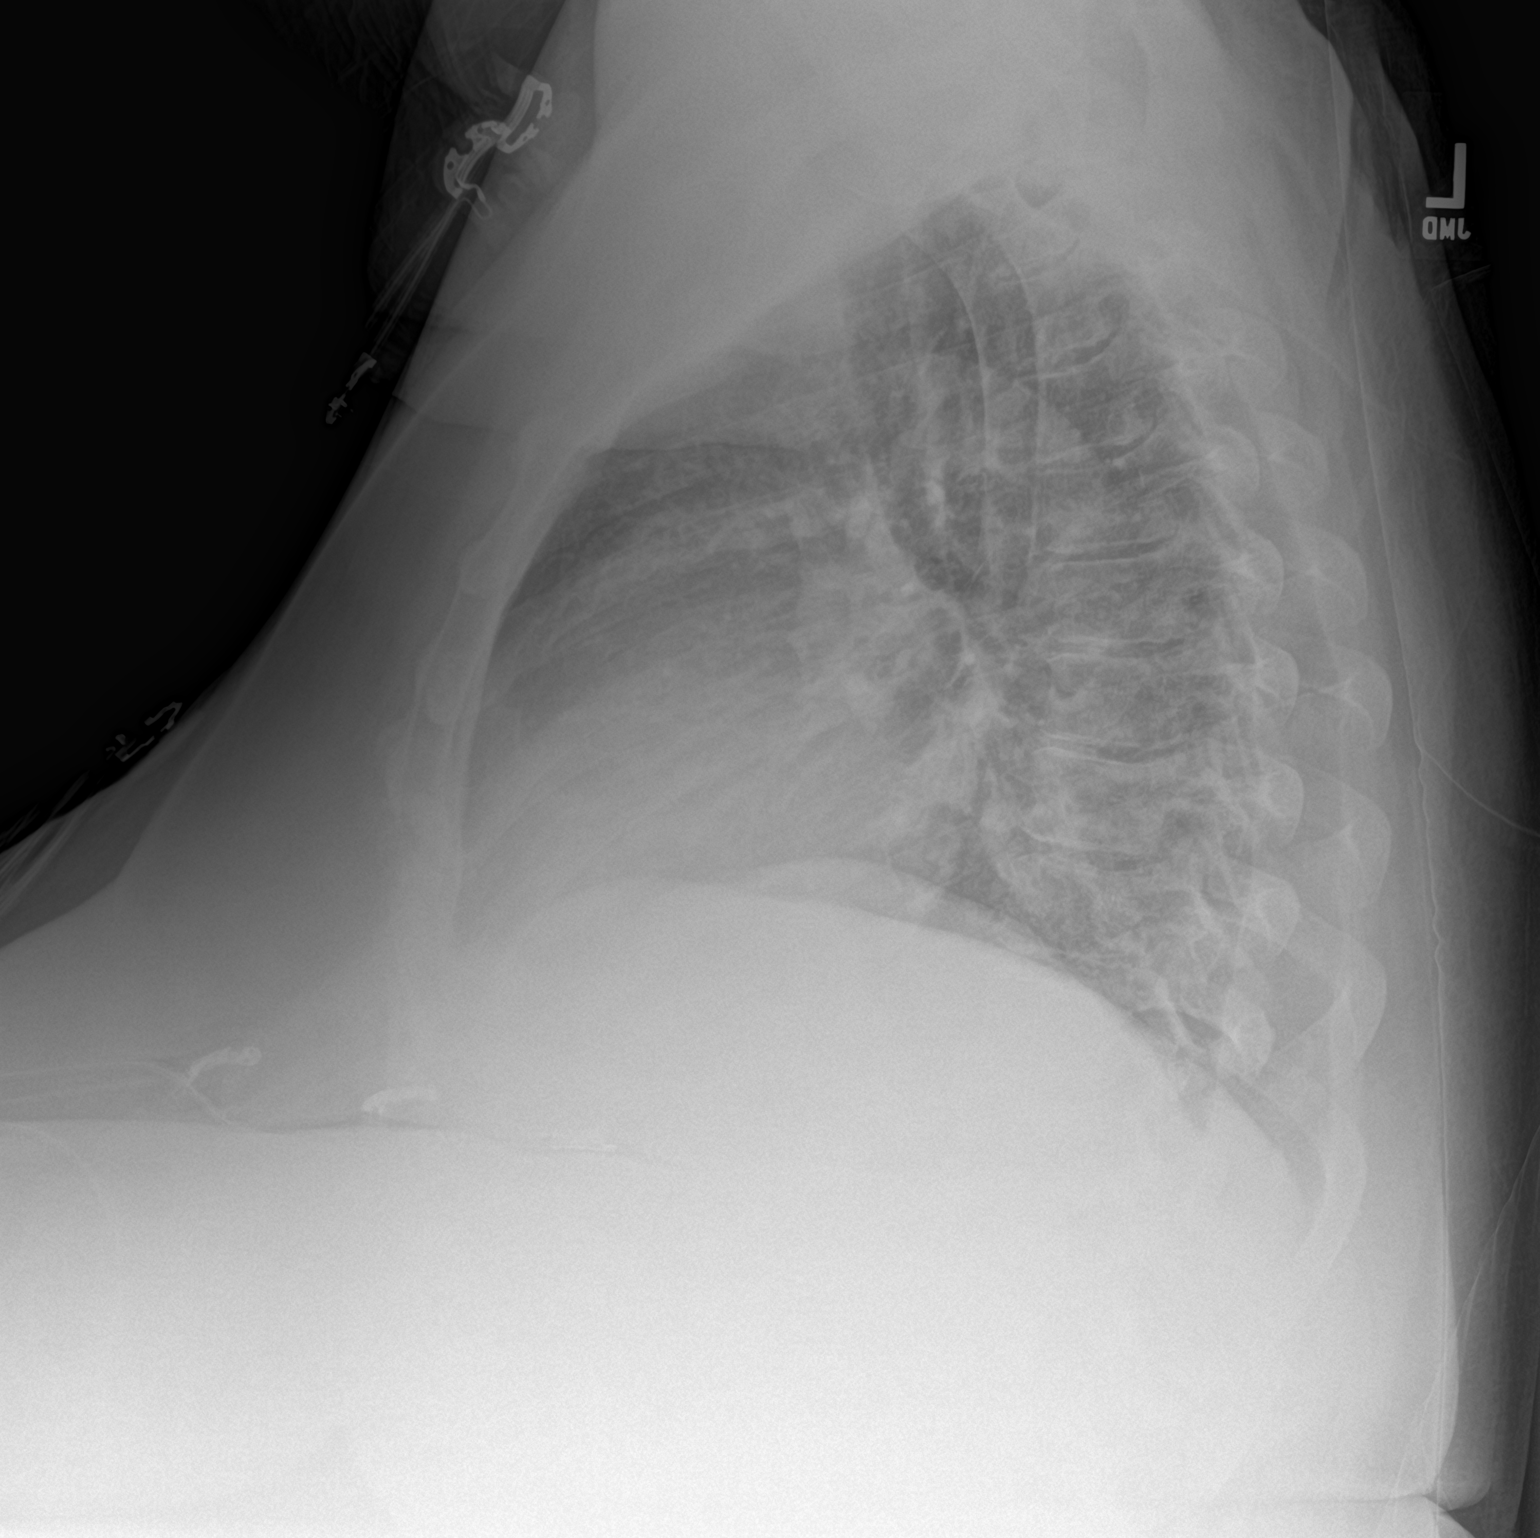

[chest ap]
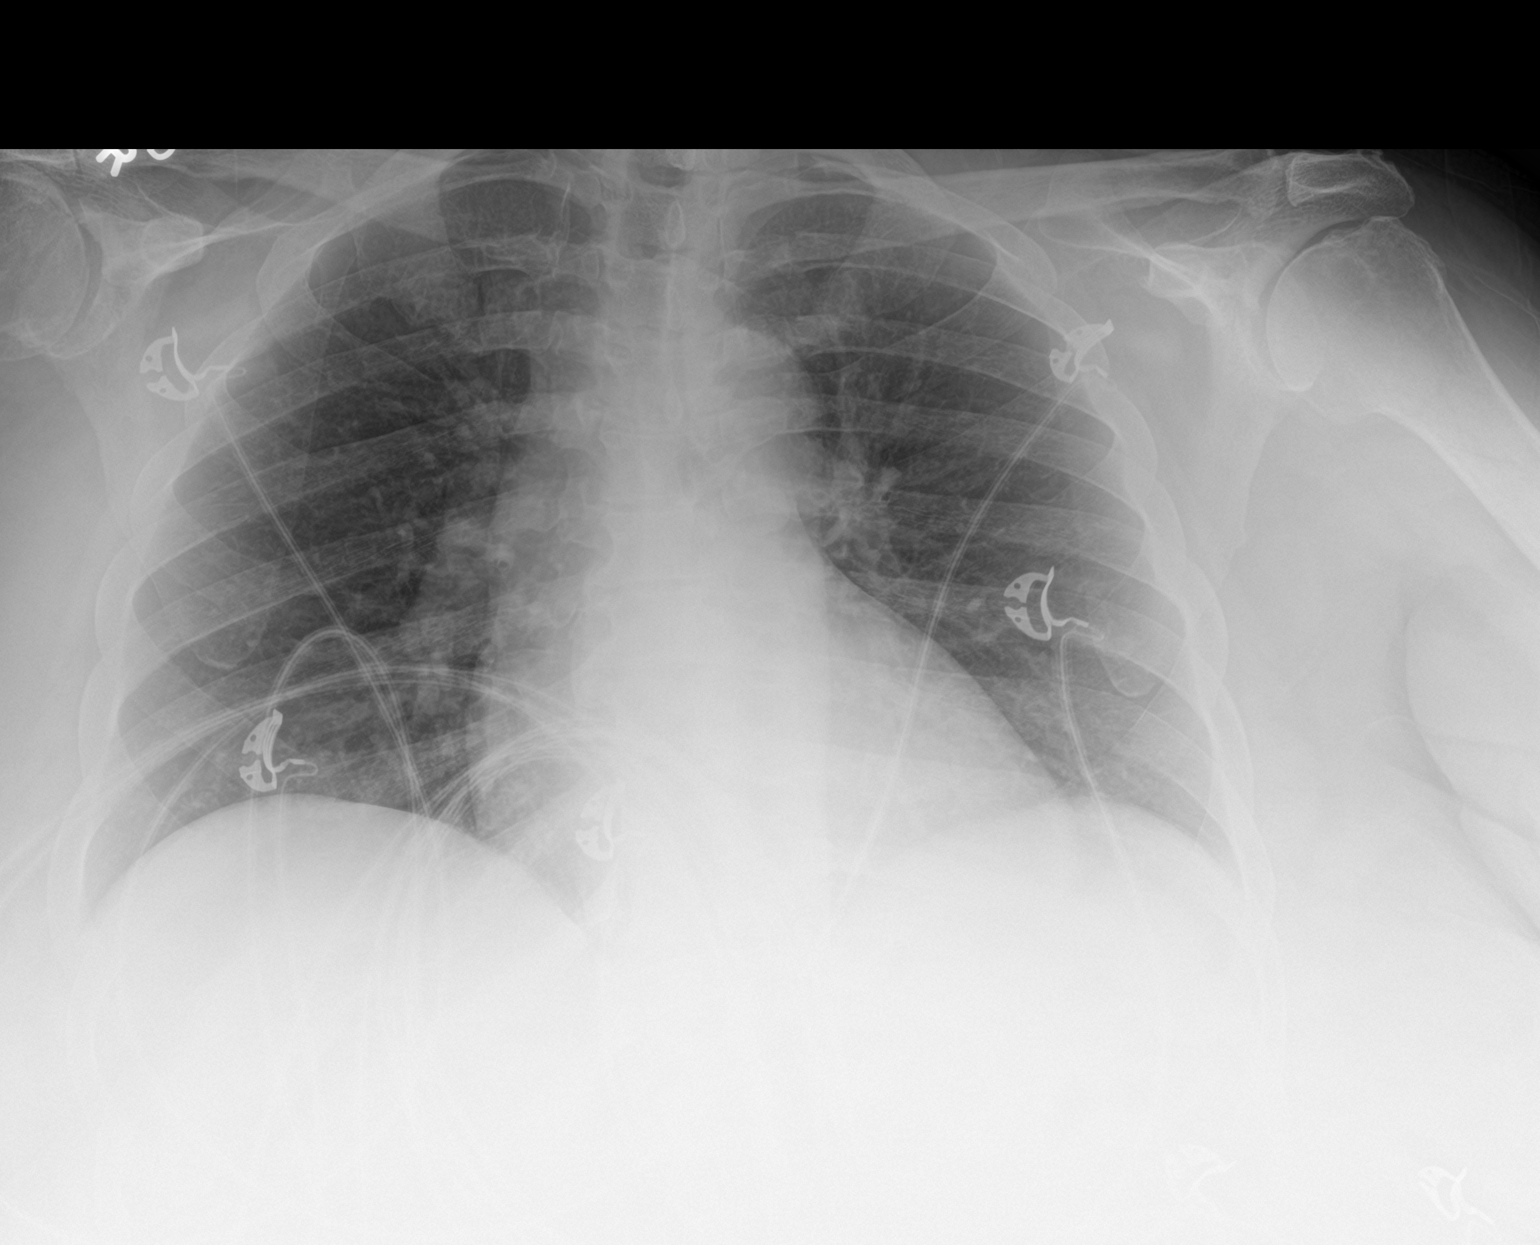

[2 of 2 positions shown; findings below may reference images not displayed]

FINDINGS: The cardiac silhouette, mediastinal and hilar contours are within
normal limits given the AP projection and low lung volumes. No
infiltrates or effusions. The bony thorax is intact. Mild
degenerative changes involving both shoulders and both AC joints.
IMPRESSION: No acute cardiopulmonary findings.

## 2018-01-13 IMAGING — CR DG CHEST 2V
2 series · 2 of 2 positions shown · non-contrast
Comparison: 11/03/2015.

CLINICAL DATA: Chest pain with nausea and vomiting today. History
of diabetes and hypertension.

EXAM:
CHEST  2 VIEW

[w chest pa]
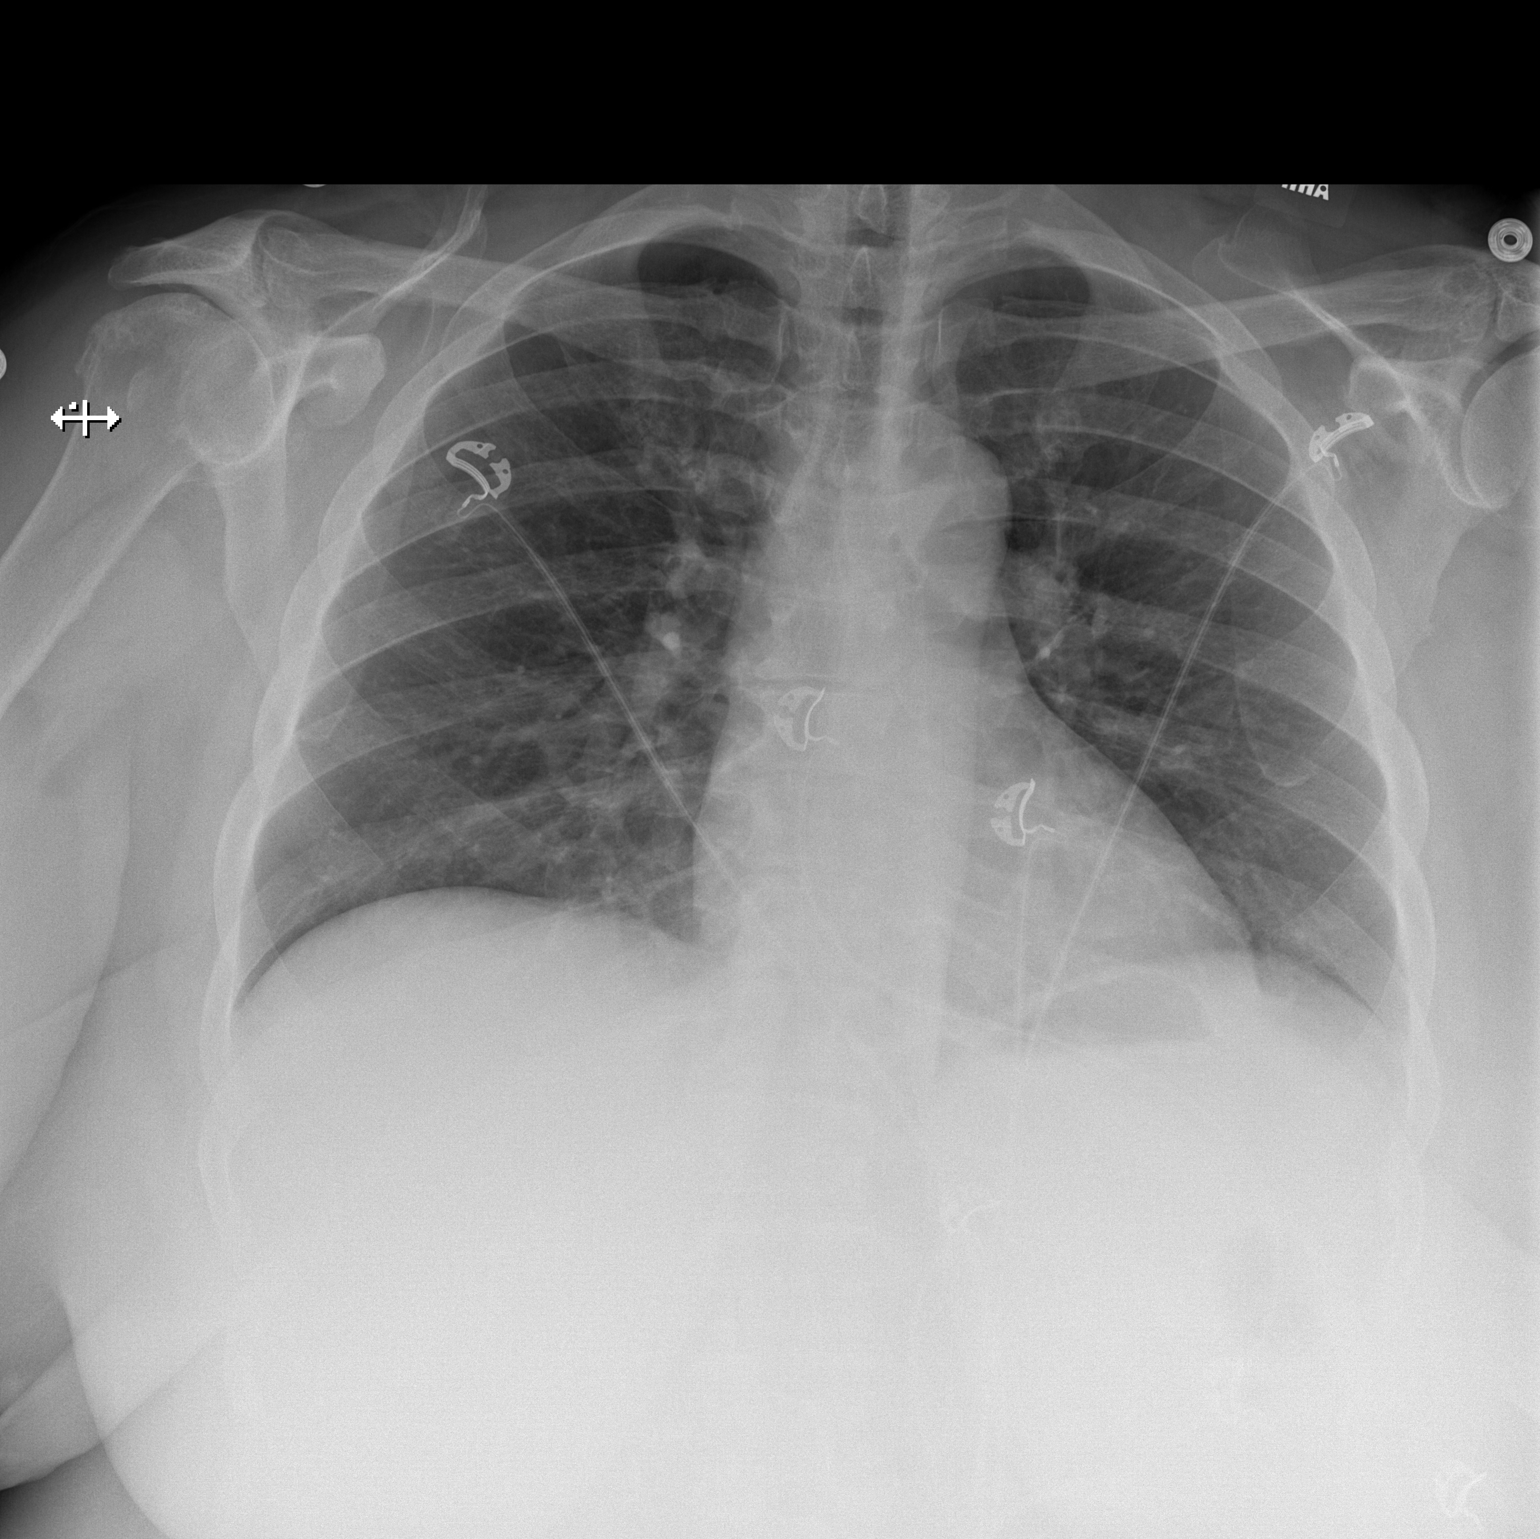

[w chest lat]
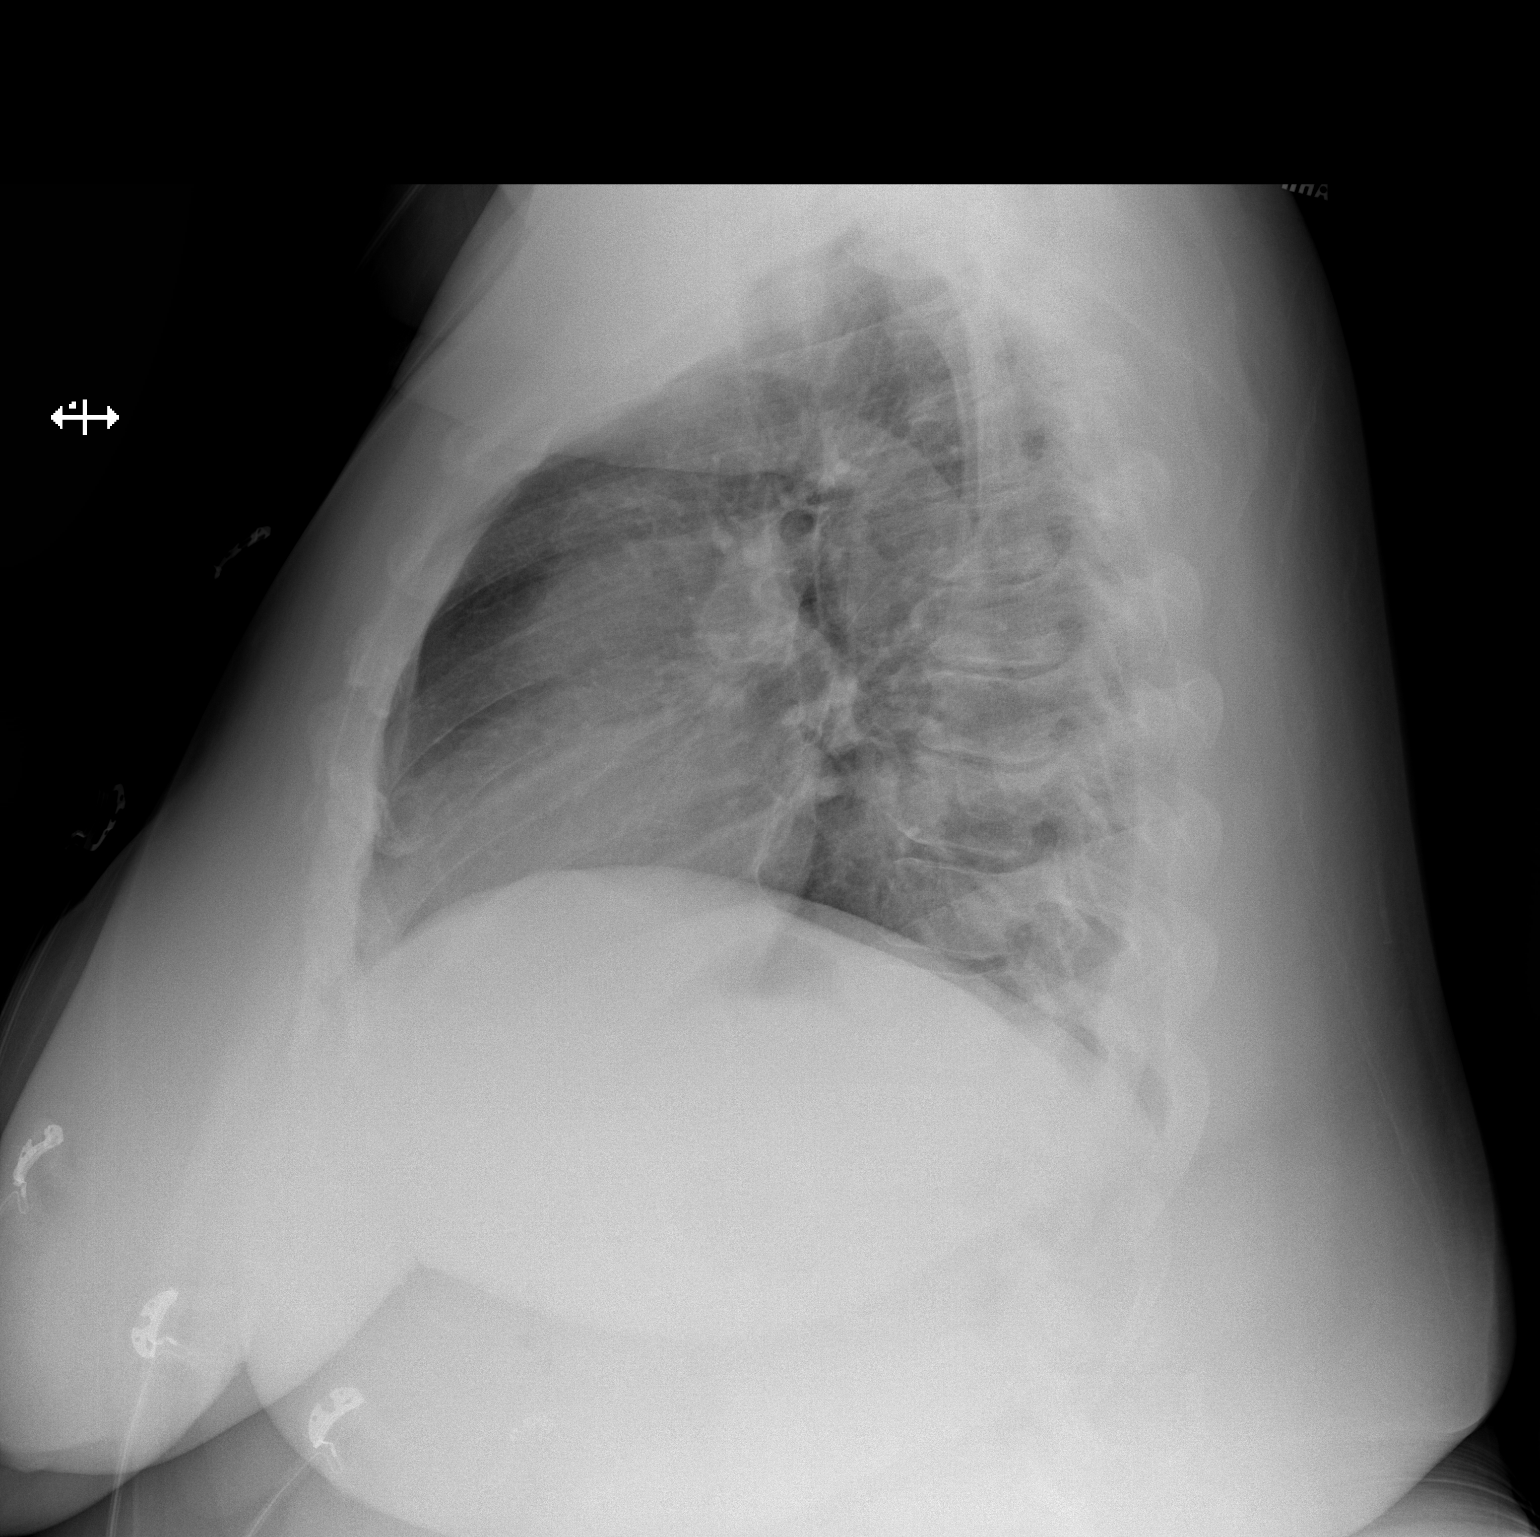

[2 of 2 positions shown; findings below may reference images not displayed]

FINDINGS: The heart size and mediastinal contours are normal. The lungs are
clear. There is no pleural effusion or pneumothorax. No acute
osseous findings are identified. Mild degenerative changes are
present within thoracic spine. There are probable asymmetric
glenohumeral degenerative changes on the right.
IMPRESSION: Stable chest.  No active cardiopulmonary process.

## 2018-01-26 IMAGING — DX DG CHEST 2V
2 series · 2 of 2 positions shown · non-contrast
Comparison: 09/26/2015

CLINICAL DATA: Left-sided chest pain with shortness of breath
beginning this morning. Hypertension and diabetes.

EXAM:
CHEST  2 VIEW

[chest pa]
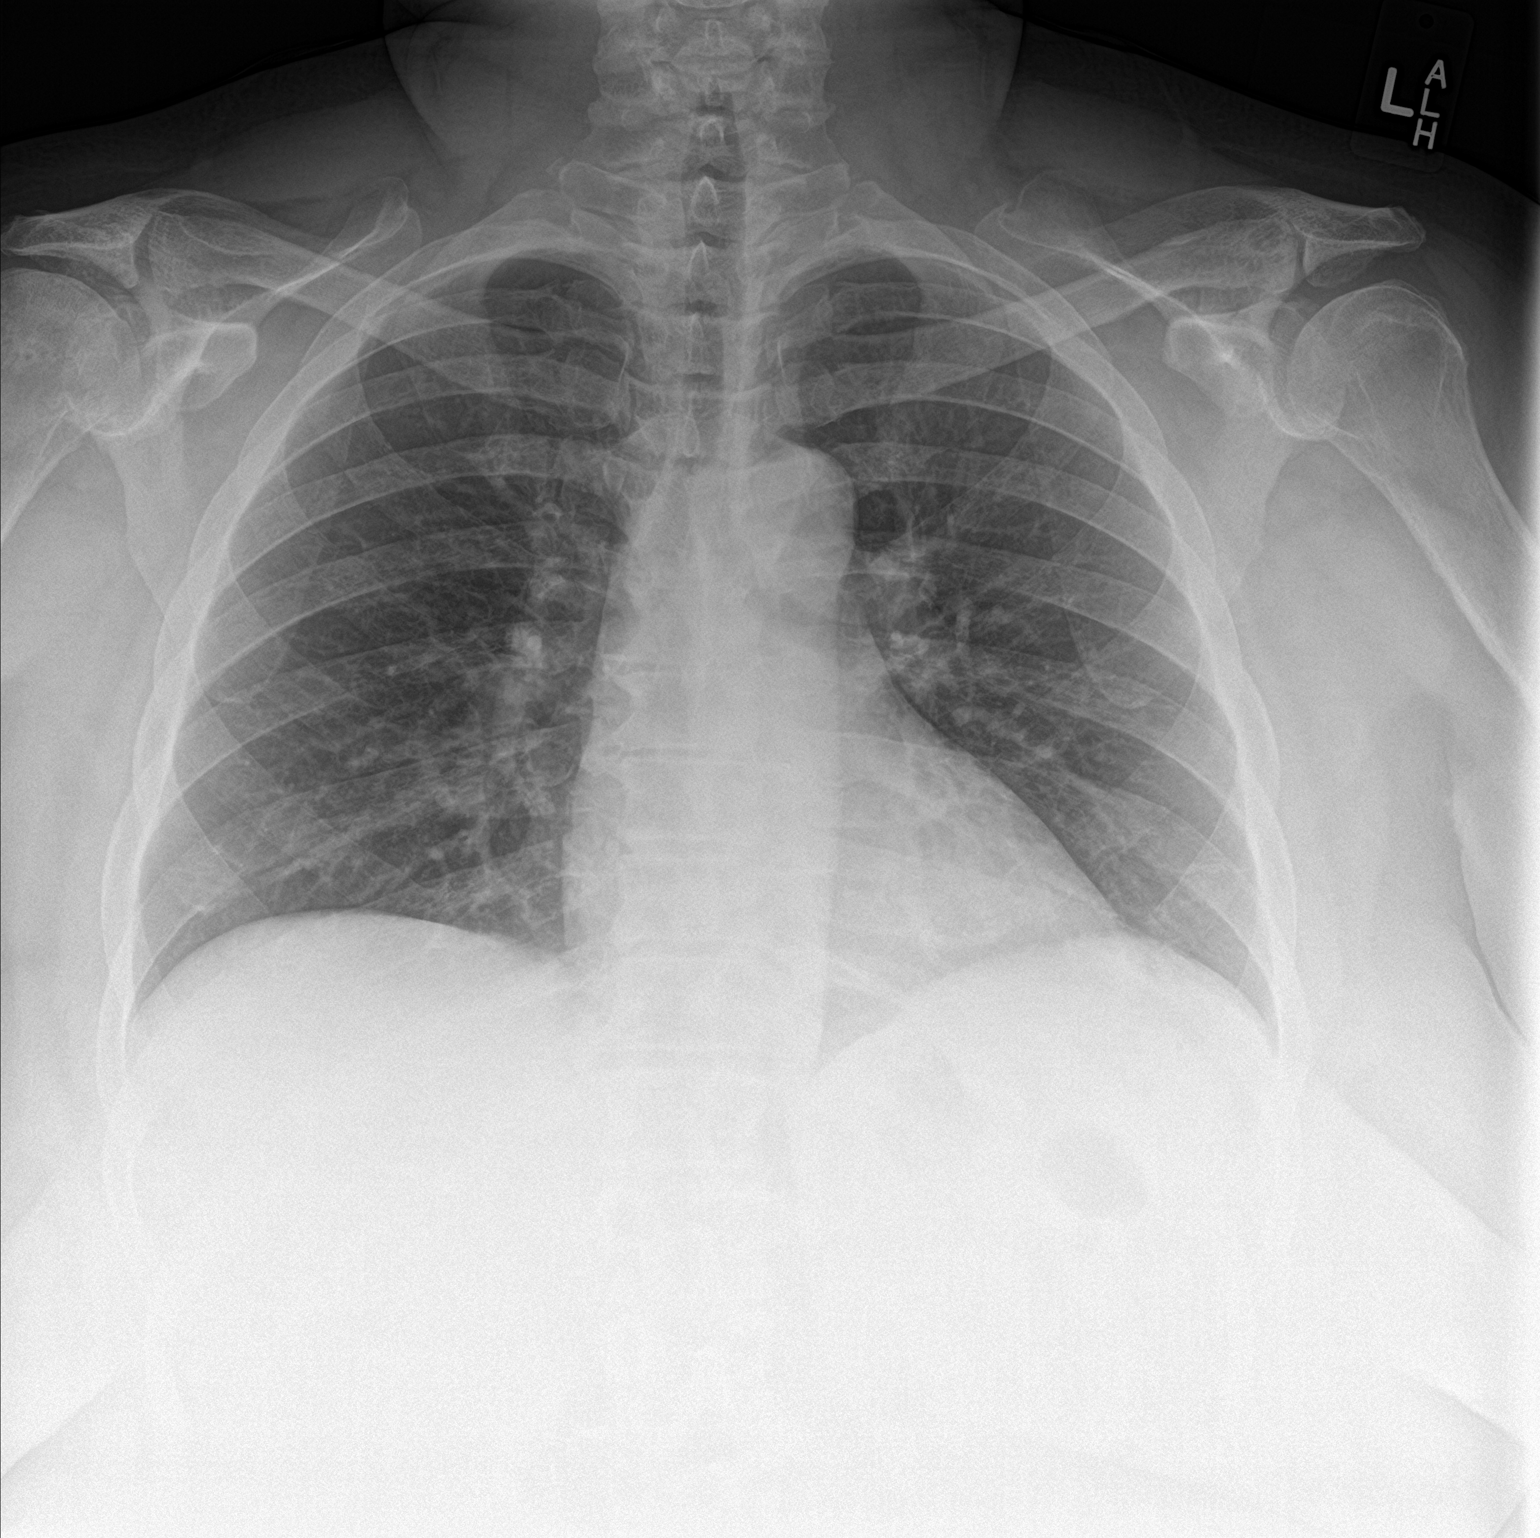

[chest lat]
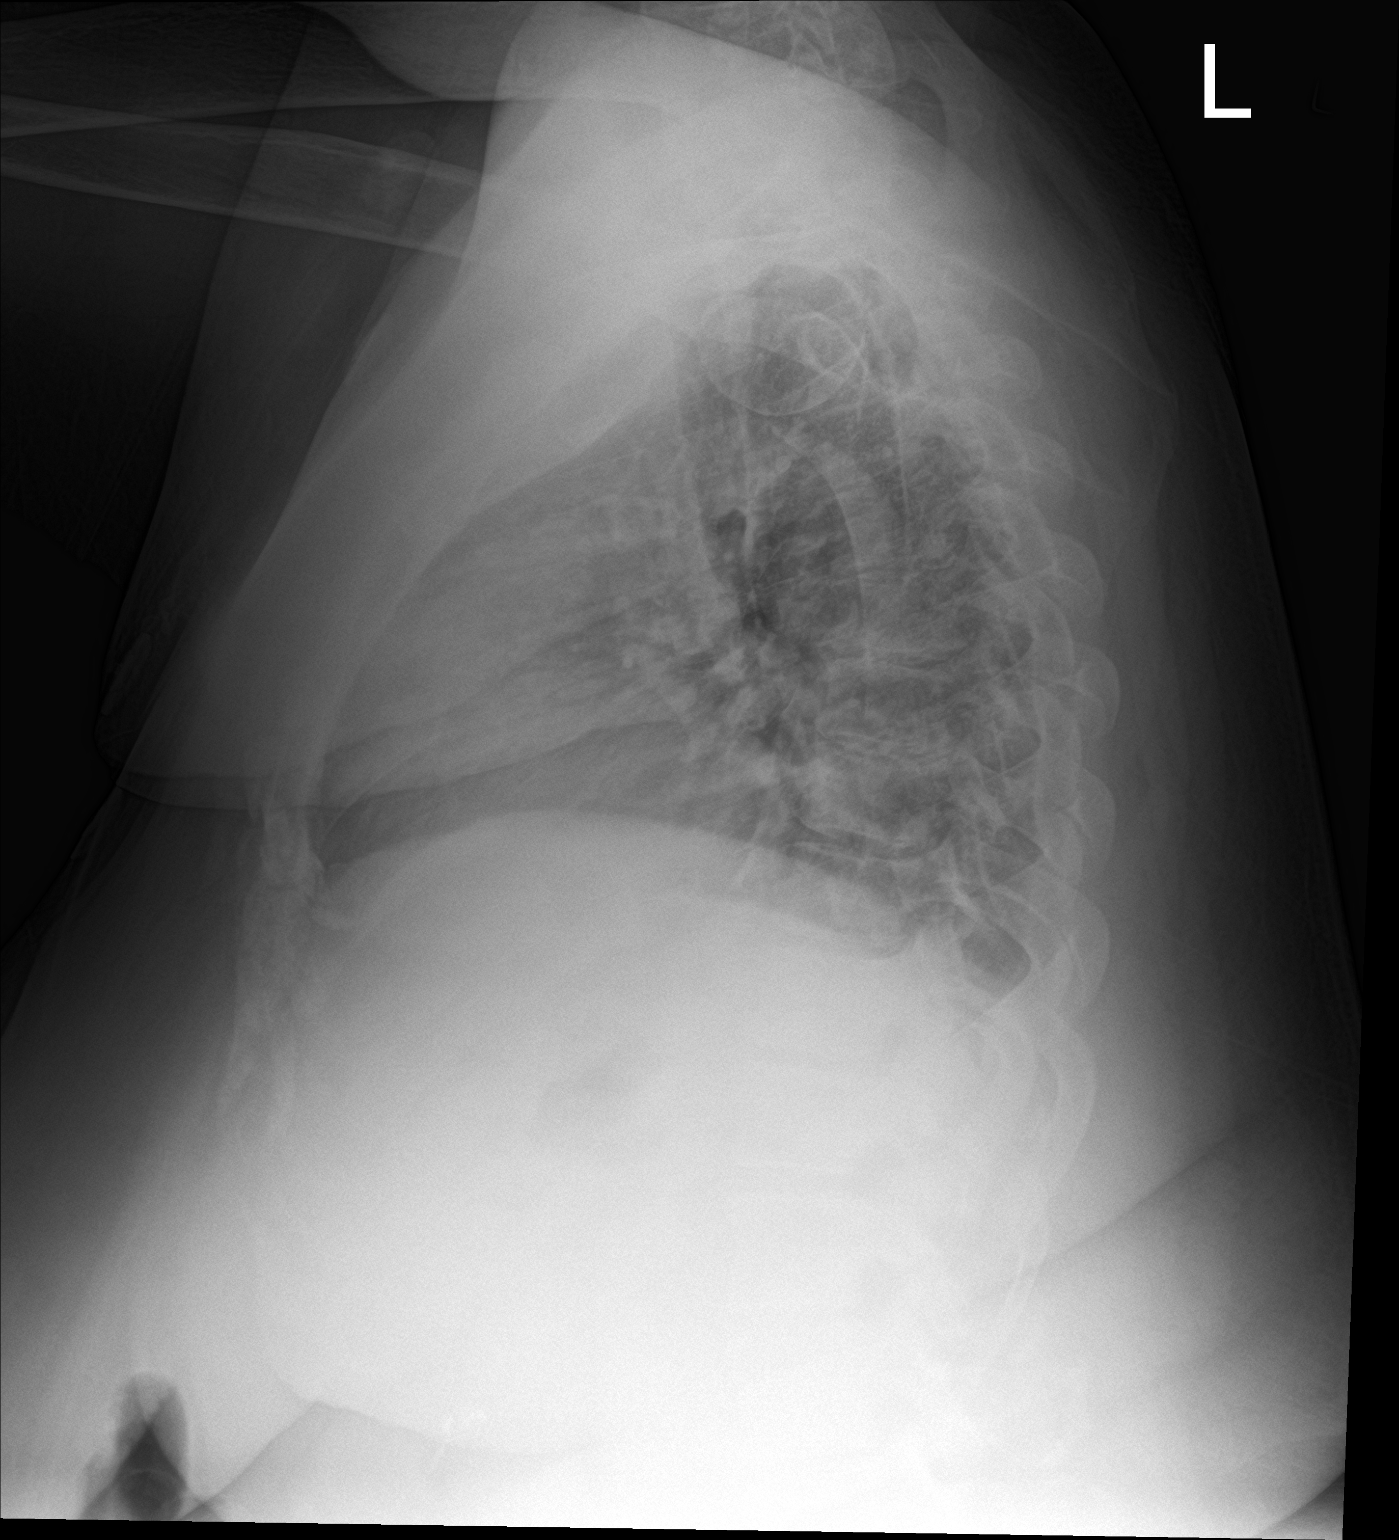

[2 of 2 positions shown; findings below may reference images not displayed]

FINDINGS: The heart size and mediastinal contours are within normal limits.
Both lungs are clear. The visualized skeletal structures are
unremarkable.
IMPRESSION: No active cardiopulmonary disease.

## 2018-02-22 IMAGING — CR DG CHEST 2V
2 series · 2 of 2 positions shown · non-contrast
Comparison: 12/05/2015

CLINICAL DATA: Chest pain, shortness of breath

EXAM:
CHEST  2 VIEW

[x chest ap]
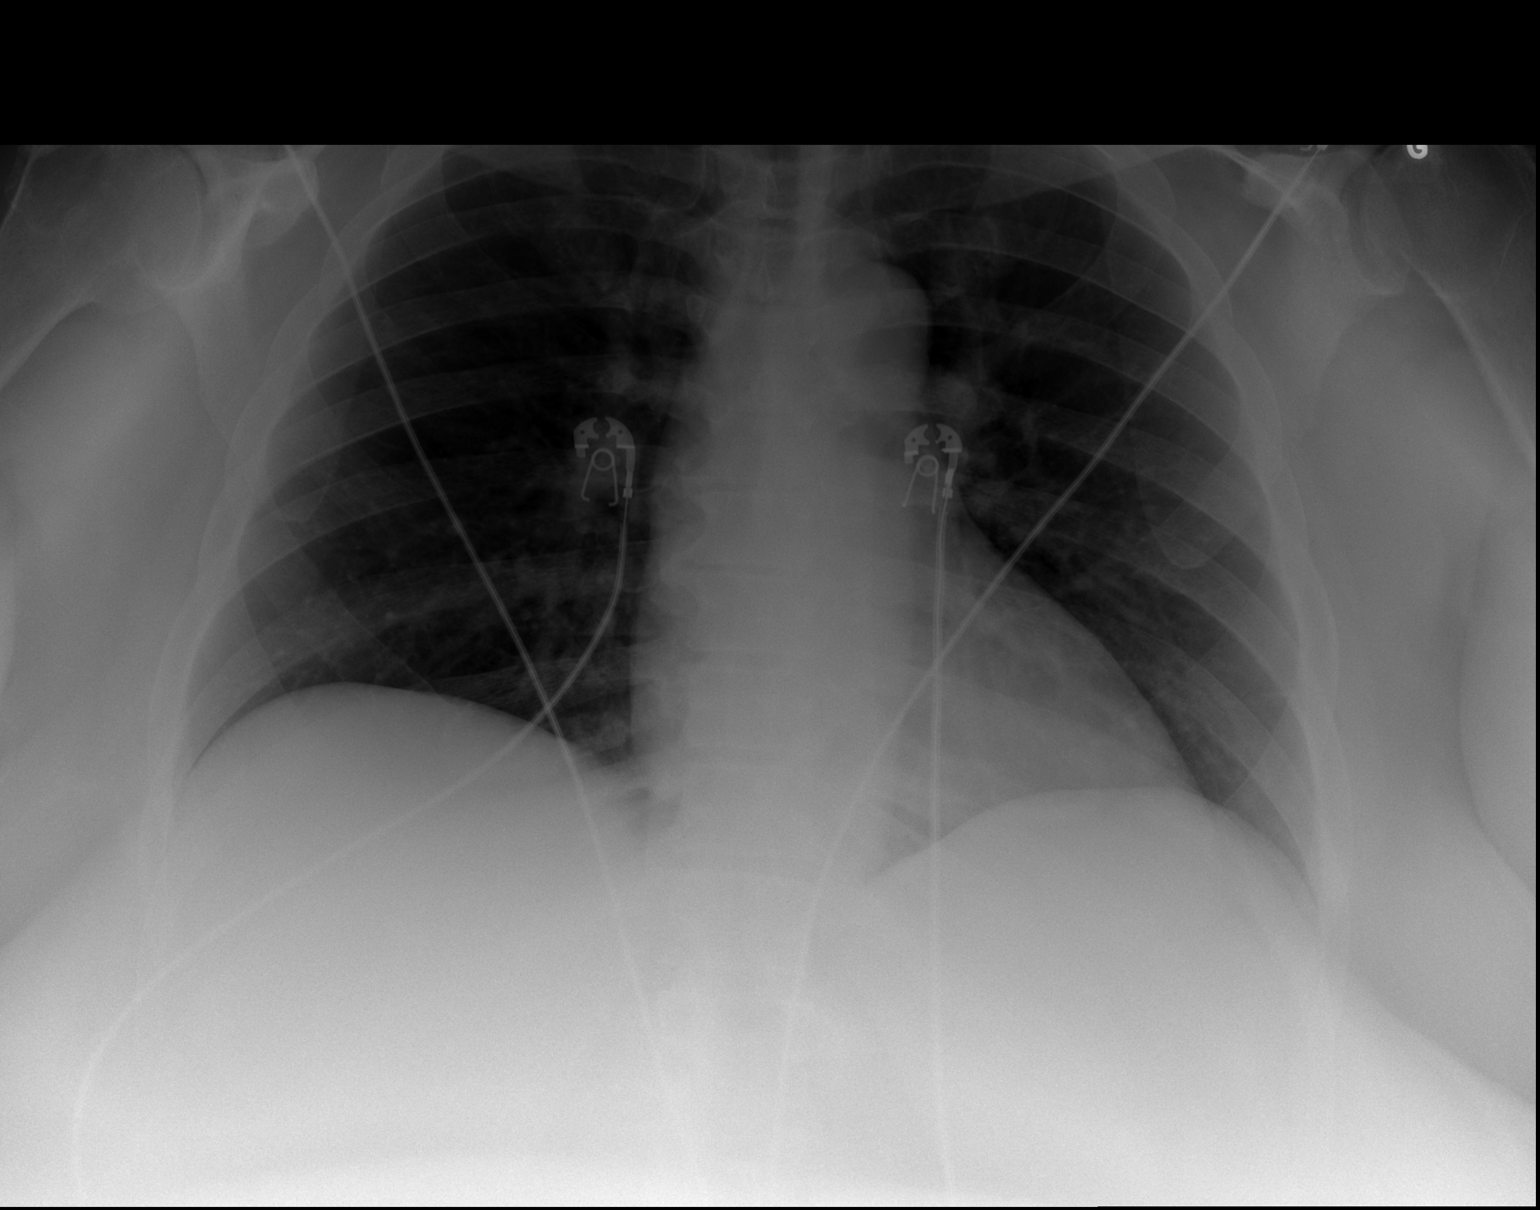

[w chest lat]
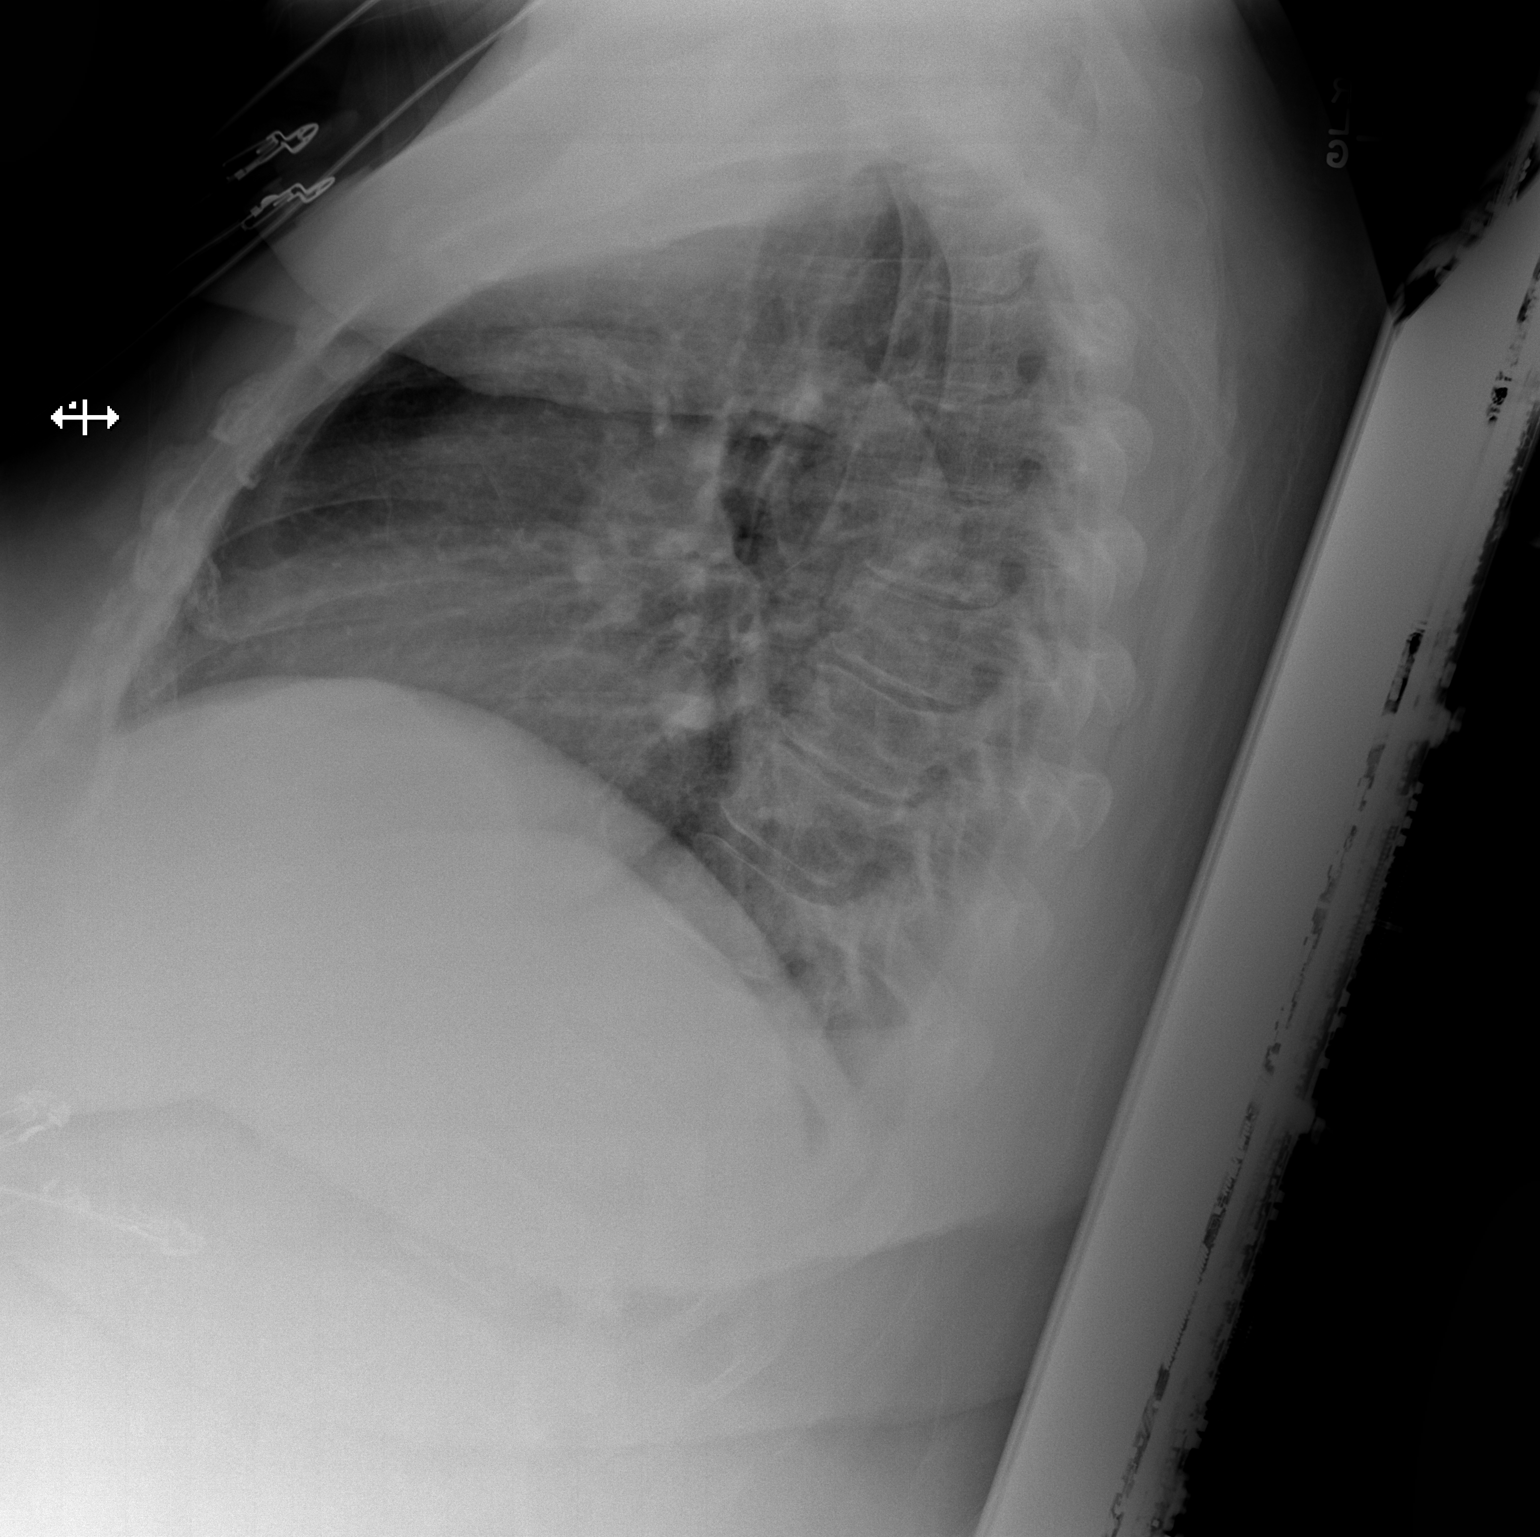

[2 of 2 positions shown; findings below may reference images not displayed]

FINDINGS: The heart size and mediastinal contours are within normal limits.
Both lungs are clear. The visualized skeletal structures are
unremarkable.
IMPRESSION: No active cardiopulmonary disease.

## 2018-02-22 IMAGING — CT CT ANGIO CHEST
3 of 7 series · 19 of 36 positions shown · IV contrast (ISOVUE 370)
Comparison: None.

CLINICAL DATA: Chest pain

EXAM:
CT ANGIOGRAPHY CHEST WITH CONTRAST
TECHNIQUE: Multidetector CT imaging of the chest was performed using the
standard protocol during bolus administration of intravenous
contrast. Multiplanar CT image reconstructions and MIPs were
obtained to evaluate the vascular anatomy.
CONTRAST:  100 cc of Isovue 370

[Series 6: thins for pacs · axial · 0.74mm/px · z∈[+1546,+1740]mm · 15 of 222 slices shown]
[im 14/222  lung]
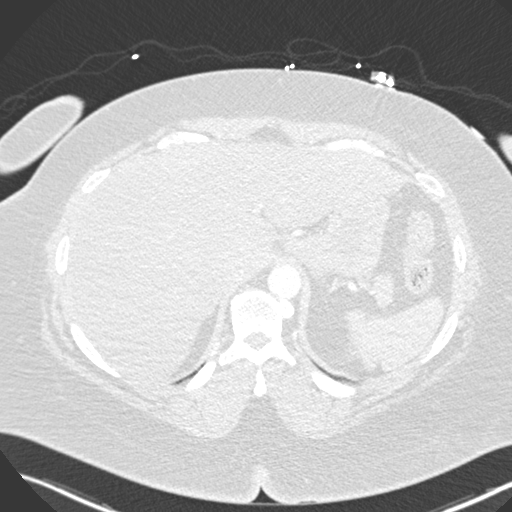
[im 28/222  mediastinal]
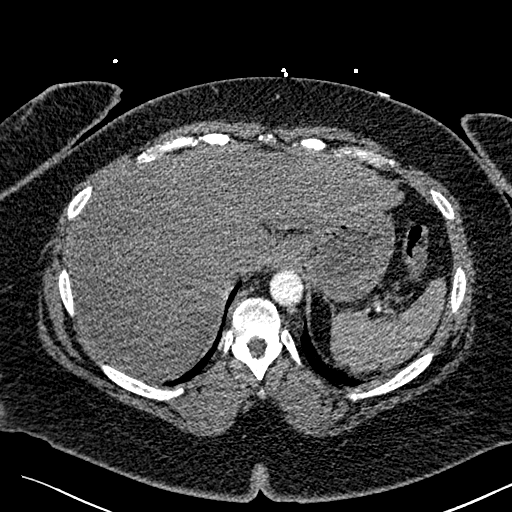
[im 42/222  lung]
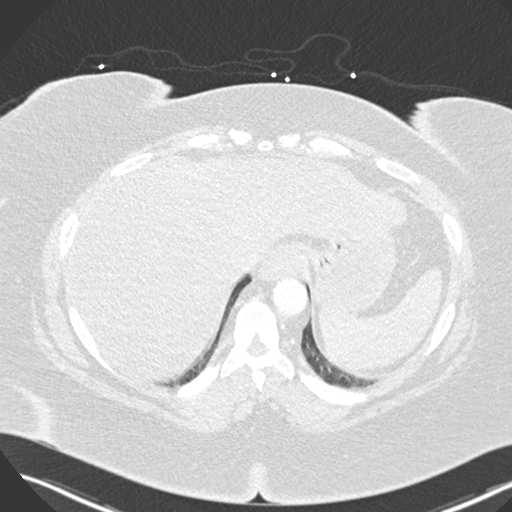
[im 56/222  mediastinal]
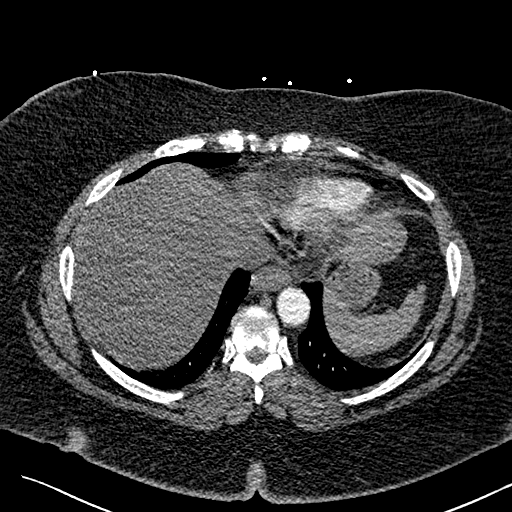
[im 70/222  lung]
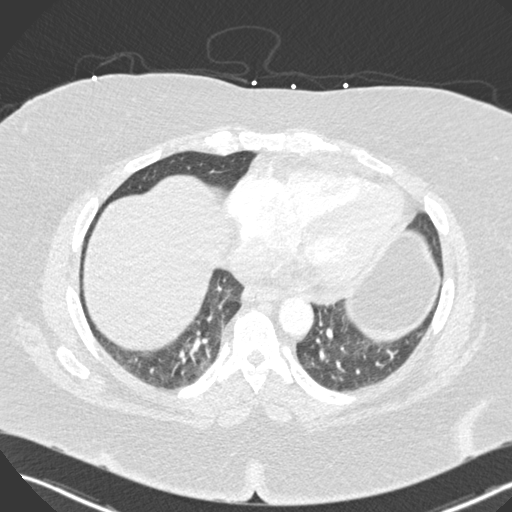
[im 83/222  mediastinal]
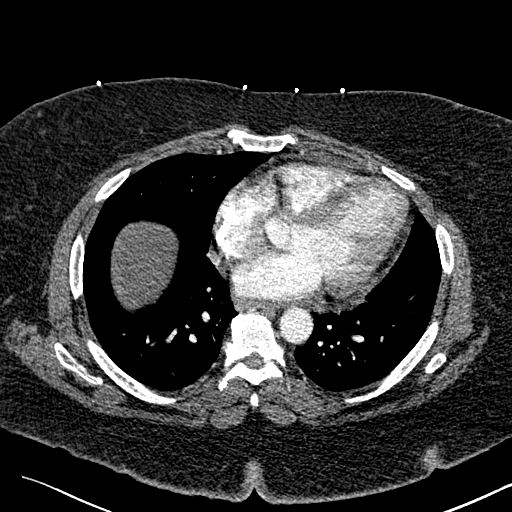
[im 97/222  lung]
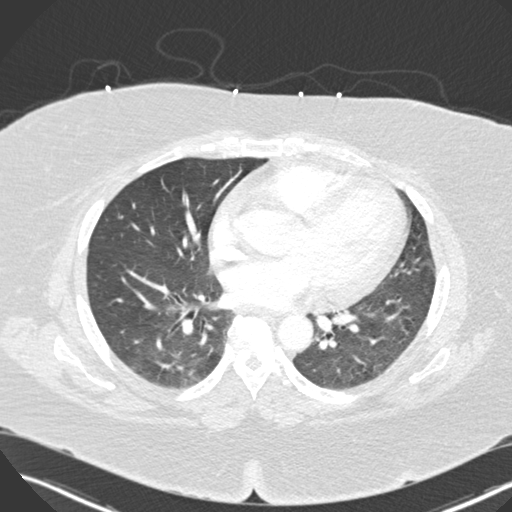
[im 111/222  mediastinal]
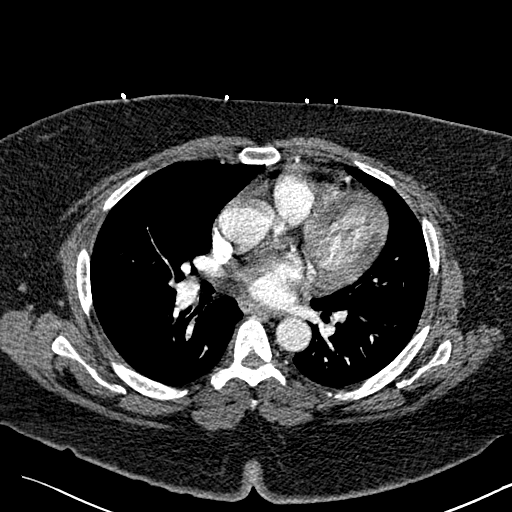
[im 125/222  lung]
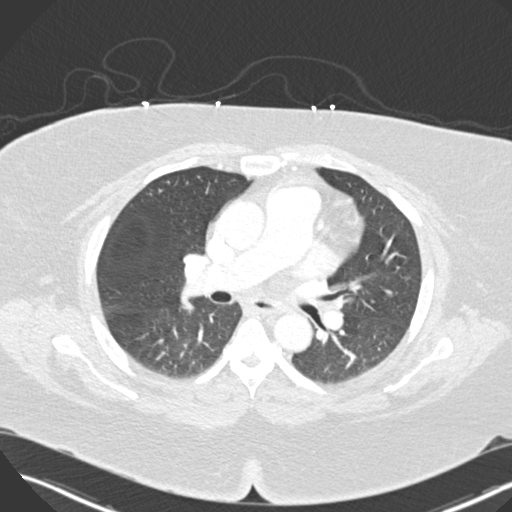
[im 139/222  mediastinal]
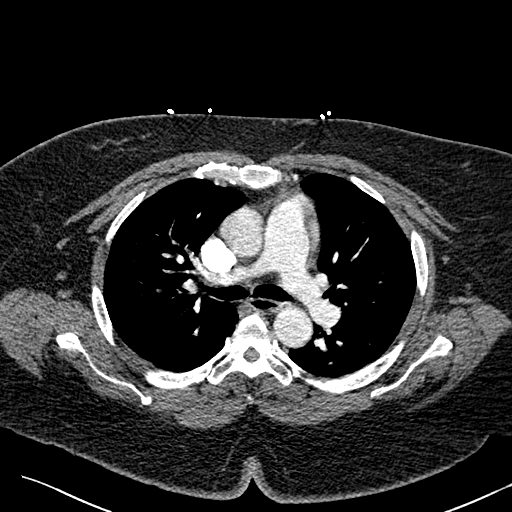
[im 152/222  lung]
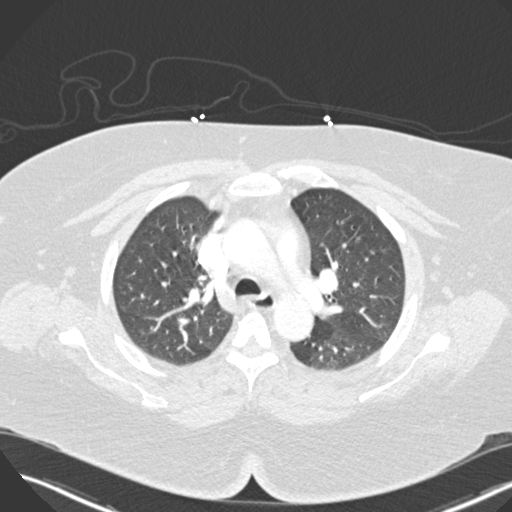
[im 166/222  mediastinal]
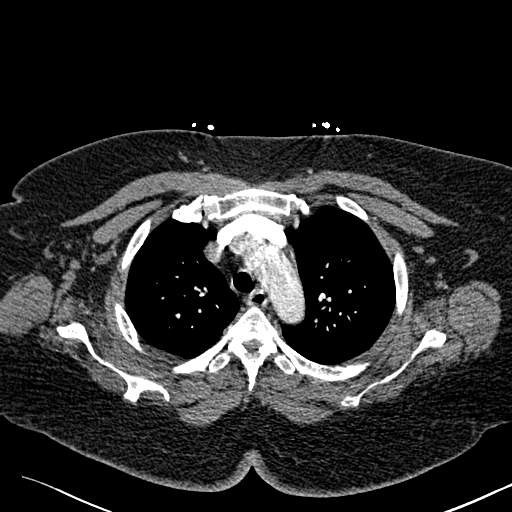
[im 180/222  lung]
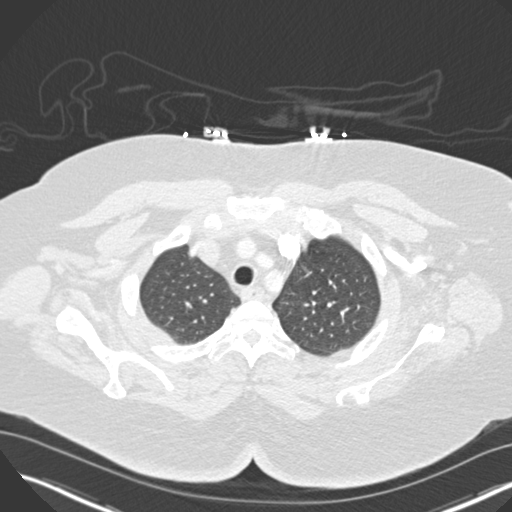
[im 194/222  mediastinal]
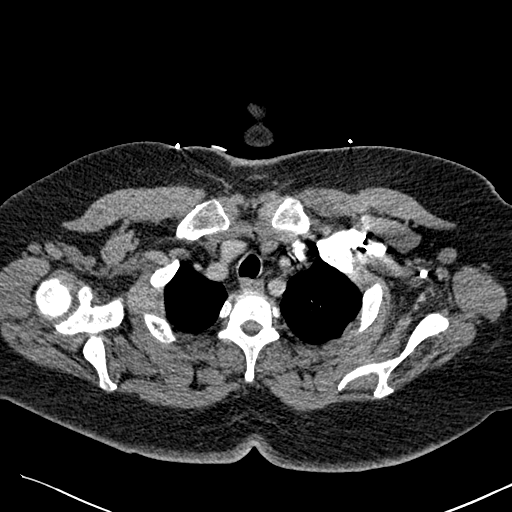
[im 208/222  lung]
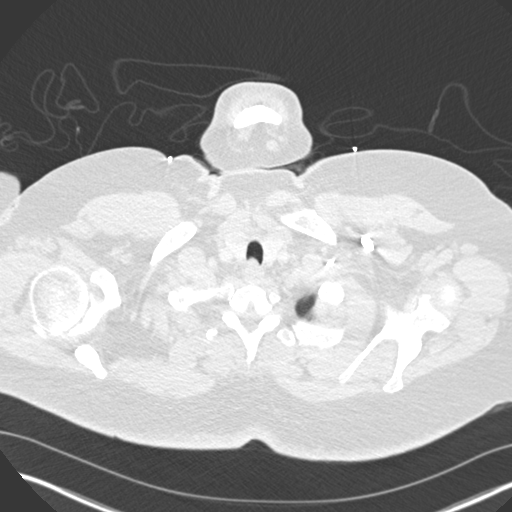

[Series 7: lung windows · axial · 0.74mm/px · z∈[+1592,+1700]mm · 3 of 72 slices shown]
[im 18/72  mediastinal]
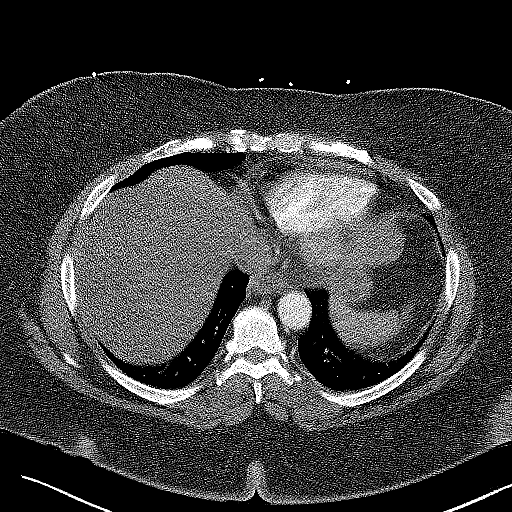
[im 36/72  mediastinal]
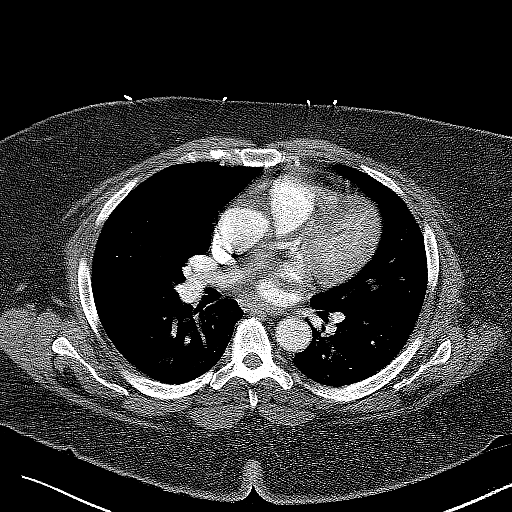
[im 54/72  mediastinal]
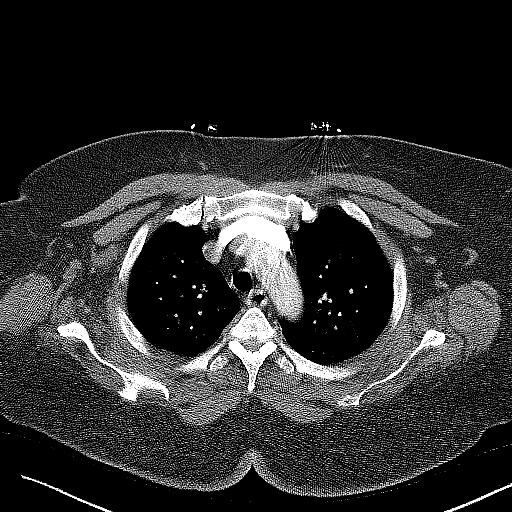

[Series 8: coronal mpr · coronal · 0.43mm/px · 1 of 135 slices shown]
[im 68/135  mediastinal]
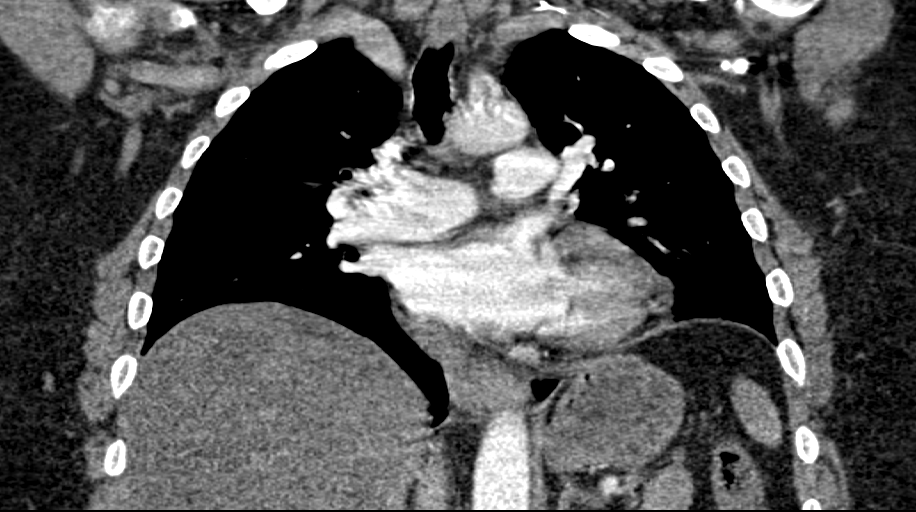

[19 of 36 positions shown; findings below may reference images not displayed]

FINDINGS: Cardiovascular: Satisfactory opacification of the pulmonary arteries
to the segmental level. No evidence of pulmonary embolism. Normal
heart size. No pericardial effusion.

Mediastinum/Nodes: No enlarged mediastinal, hilar, or axillary lymph
nodes. Thyroid gland, trachea, and esophagus demonstrate no
significant findings.

Lungs/Pleura: No airspace opacities or atelectasis.

Upper Abdomen: Hepatic steatosis noted.  No acute abnormality noted.

Musculoskeletal: Spondylosis noted. No aggressive lytic or sclerotic
bone lesions.

Review of the MIP images confirms the above findings.
IMPRESSION: 1. No evidence for acute pulmonary embolus.

## 2018-04-08 IMAGING — CR DG CHEST 2V
2 series · 2 of 2 positions shown · non-contrast
Comparison: 01/14/2016

CLINICAL DATA: Right anterior chest pain

EXAM:
CHEST  2 VIEW

[w chest pa]
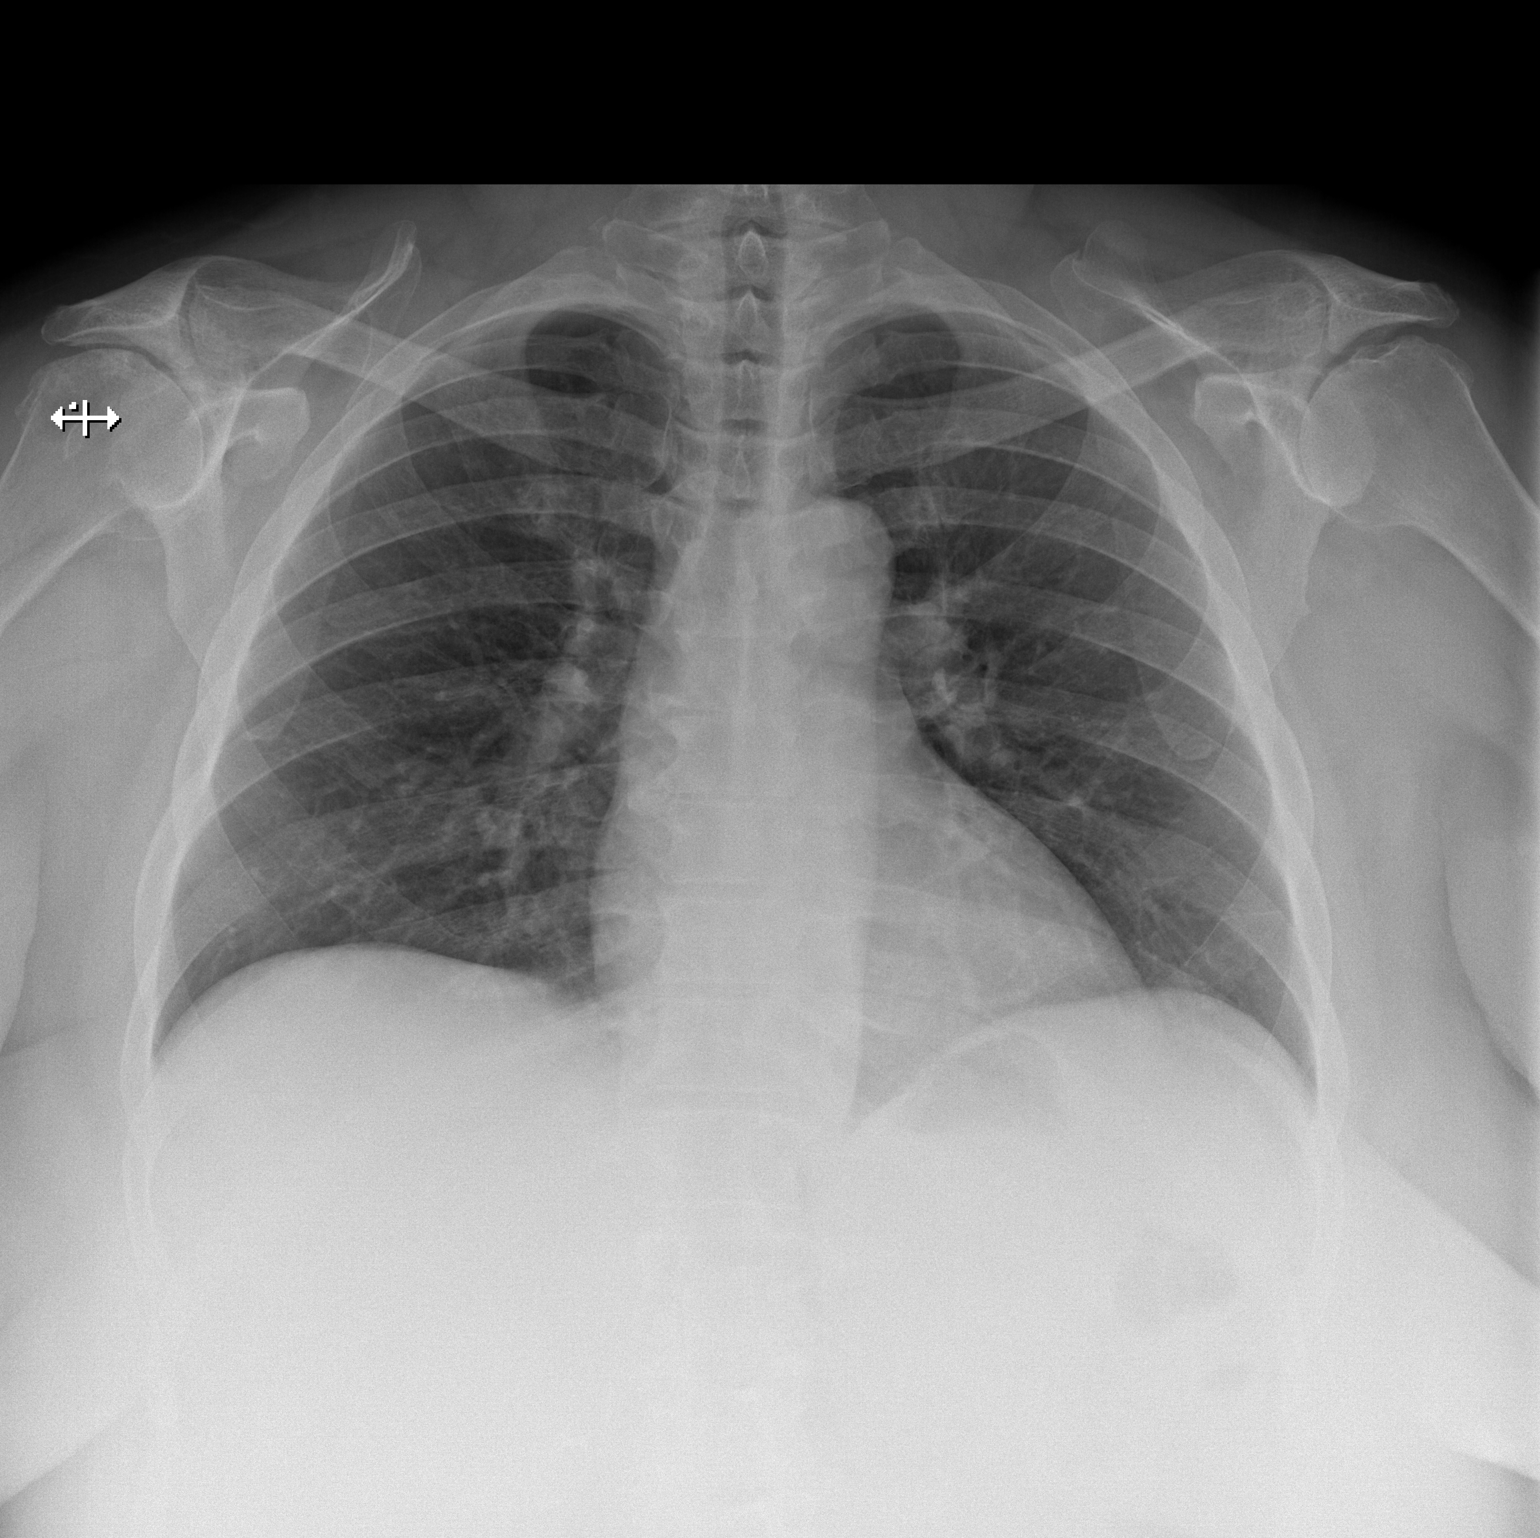

[w chest lat]
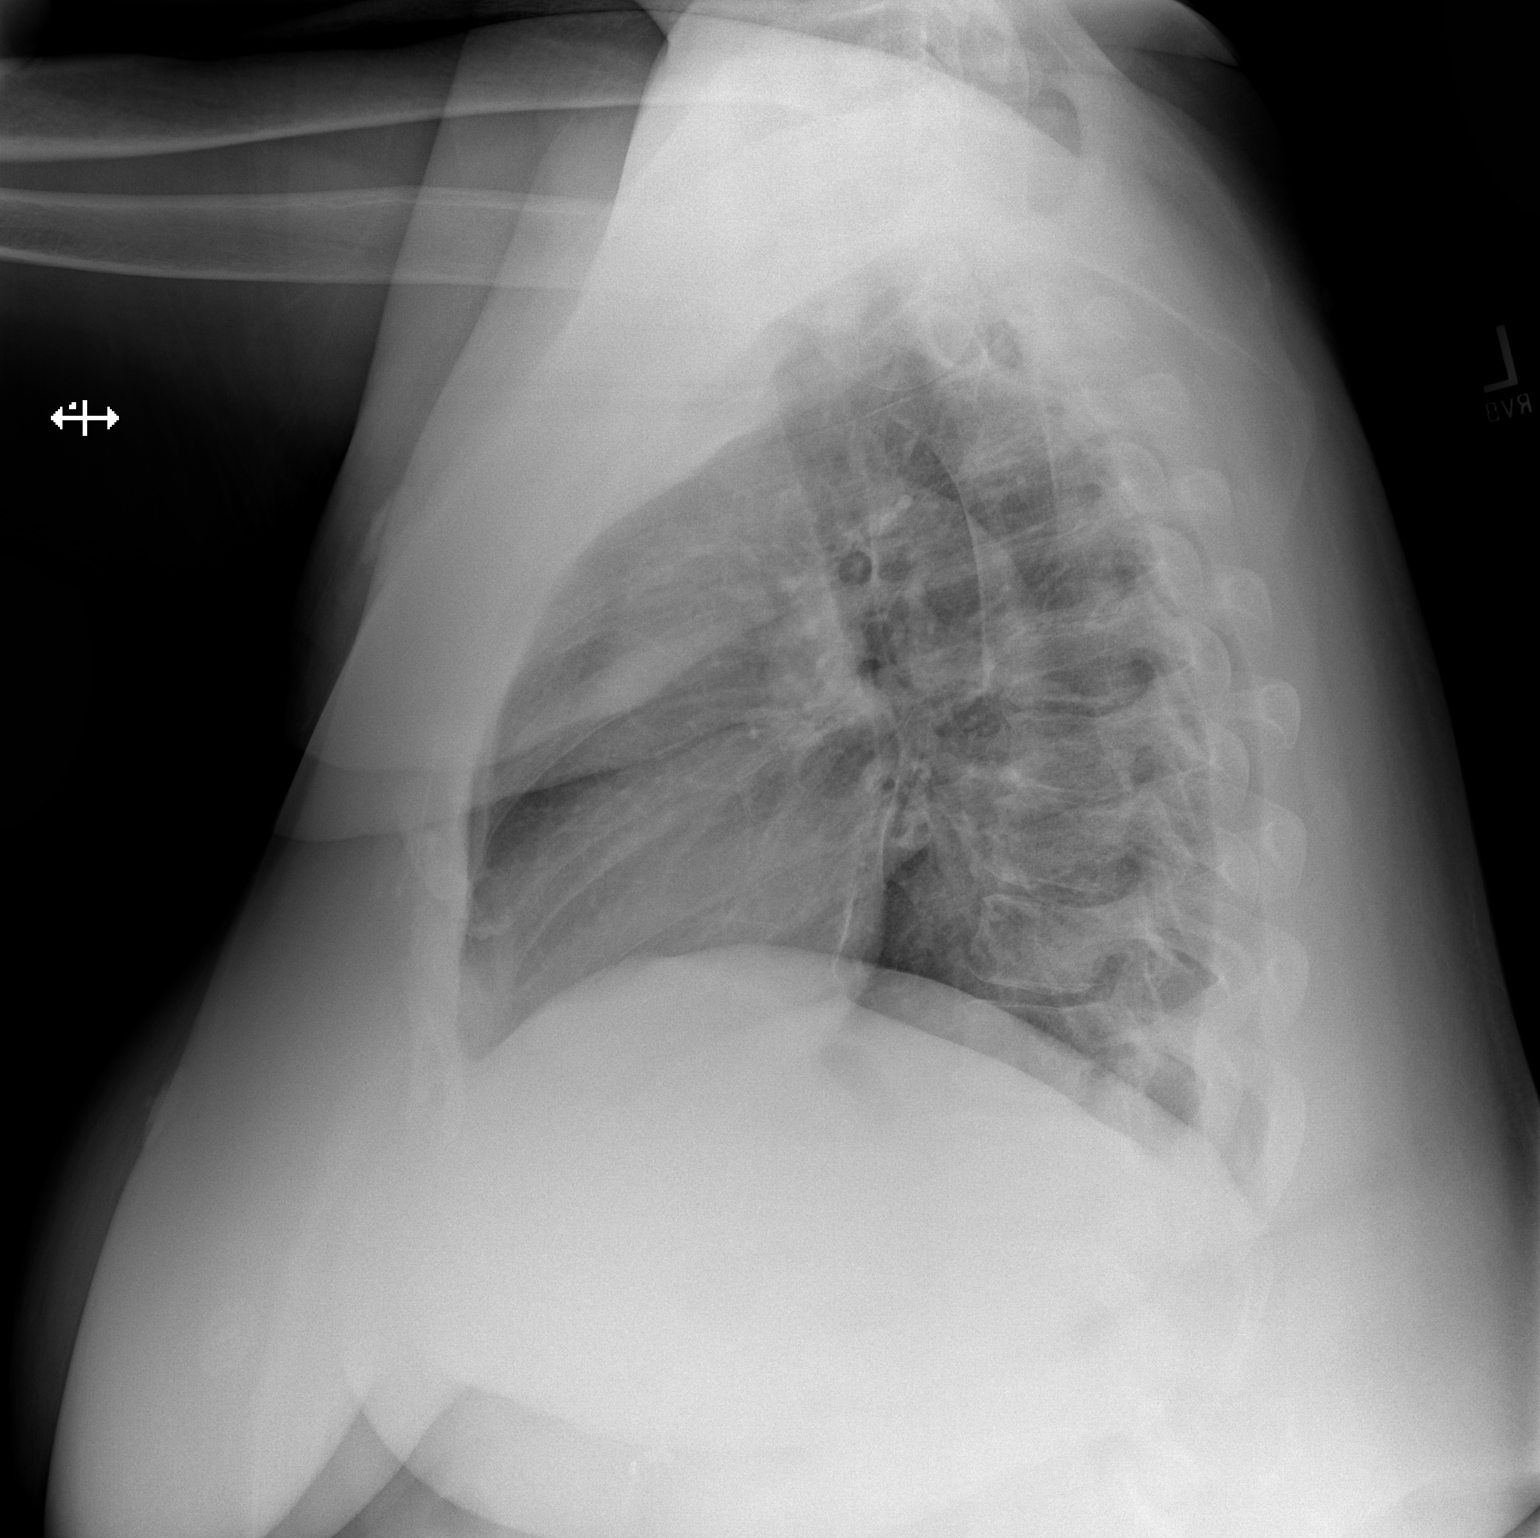

[2 of 2 positions shown; findings below may reference images not displayed]

FINDINGS: The heart size and mediastinal contours are within normal limits.
Both lungs are clear. The visualized skeletal structures are
unremarkable.
IMPRESSION: No active cardiopulmonary disease.

## 2018-05-30 IMAGING — CT CT ANGIO CHEST
2 of 6 series · 18 of 36 positions shown · IV contrast (ISOVUE 370)
Comparison: Radiographs of same day.  CT scan January 14, 2016.

CLINICAL DATA: Chest pain.  [name].

EXAM:
CT ANGIOGRAPHY CHEST WITH CONTRAST
TECHNIQUE: Multidetector CT imaging of the chest was performed using the
standard protocol during bolus administration of intravenous
contrast. Multiplanar CT image reconstructions and MIPs were
obtained to evaluate the vascular anatomy.
CONTRAST:  100 mL of Isovue 370 intravenously.

[Series 6: thins for pacs · axial · 0.80mm/px · z∈[+2096,+2294]mm · 17 of 220 slices shown]
[im 11/220  lung]
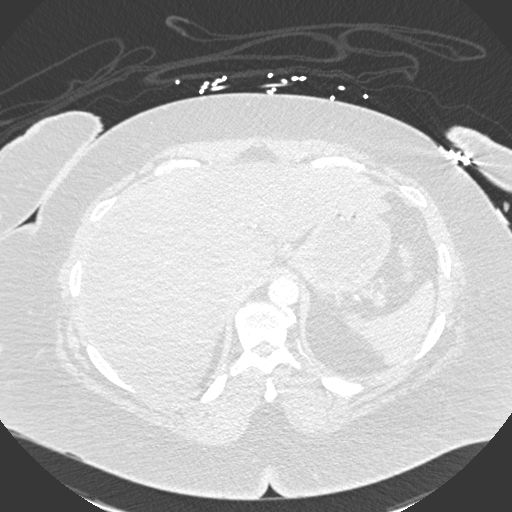
[im 22/220  mediastinal]
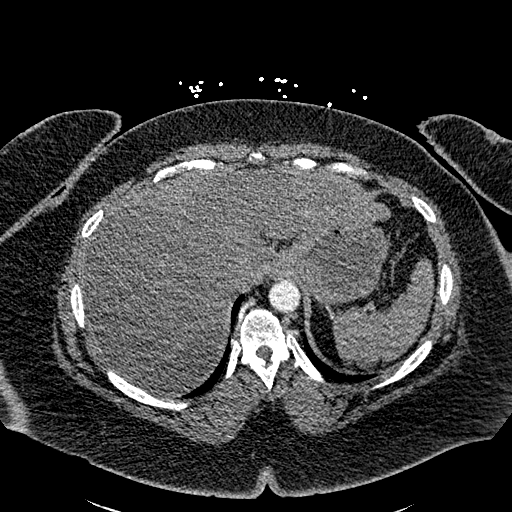
[im 33/220  lung]
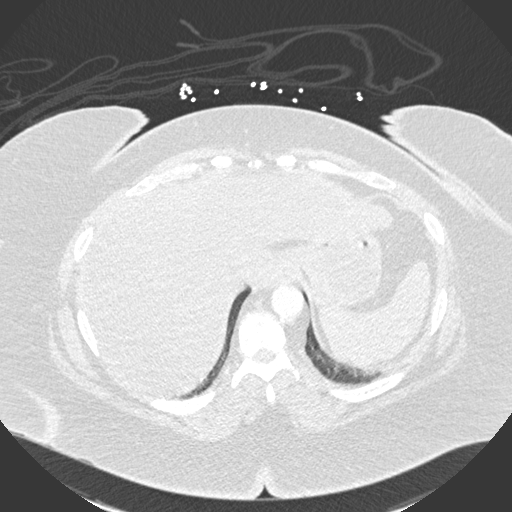
[im 44/220  mediastinal]
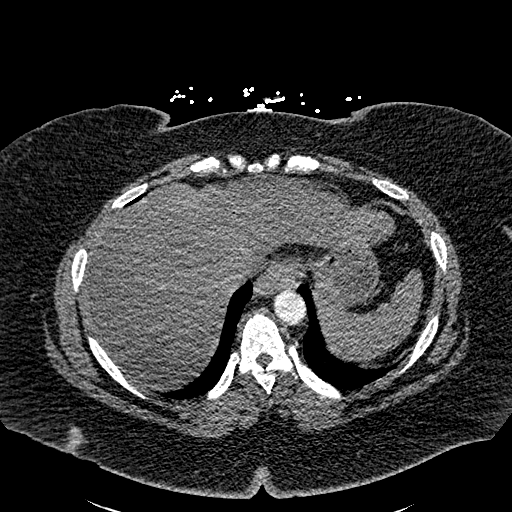
[im 66/220  lung]
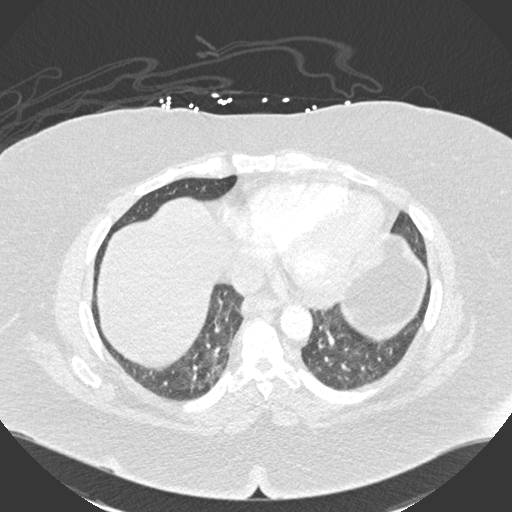
[im 77/220  mediastinal]
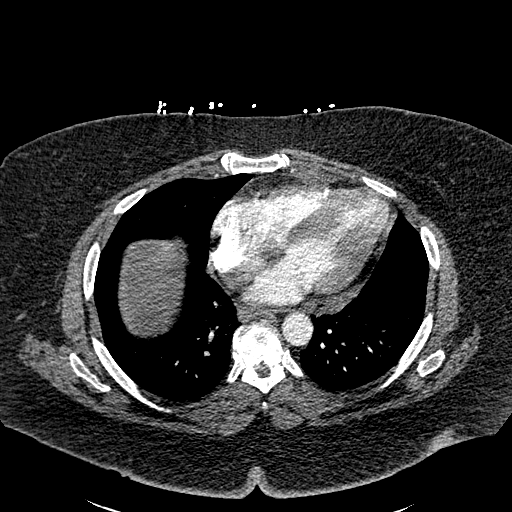
[im 88/220  lung]
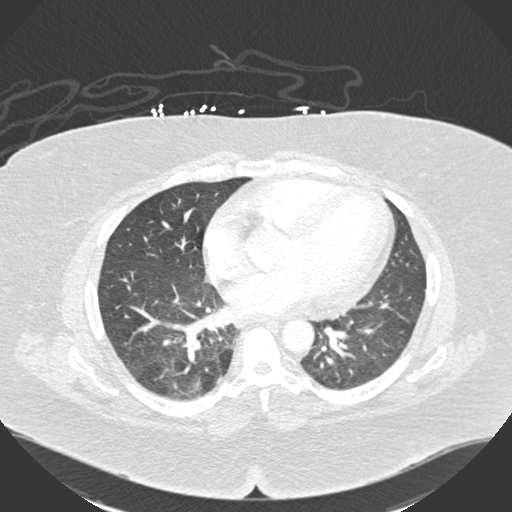
[im 99/220  mediastinal]
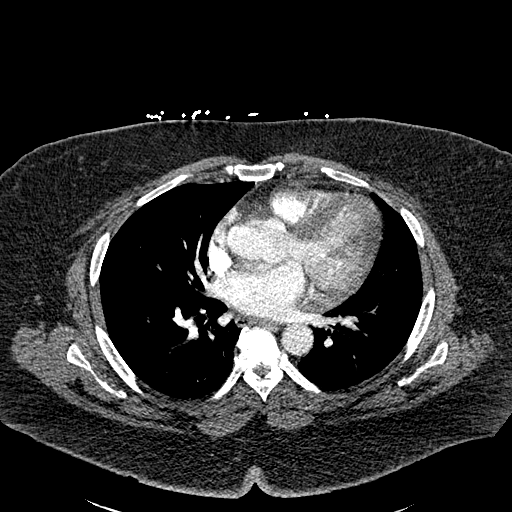
[im 110/220  lung]
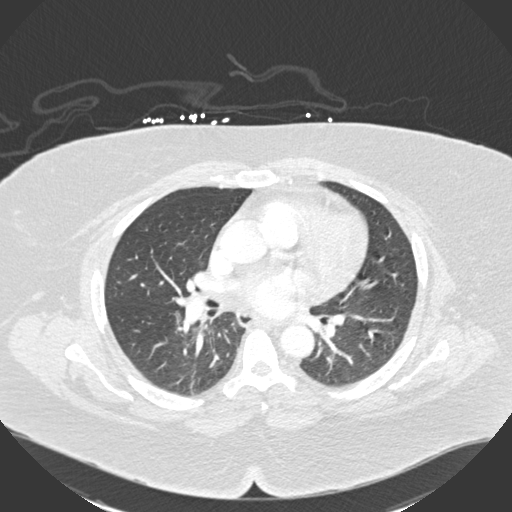
[im 121/220  mediastinal]
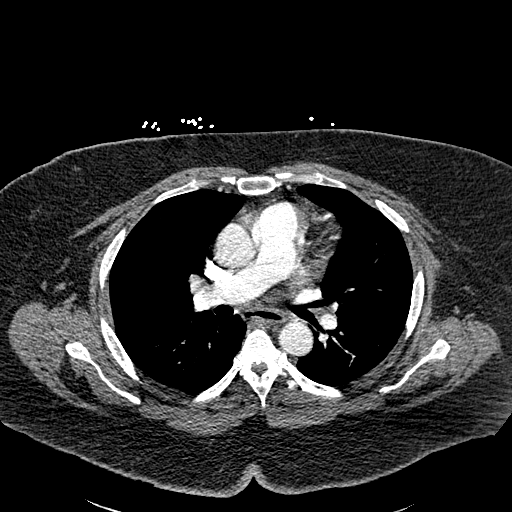
[im 132/220  lung]
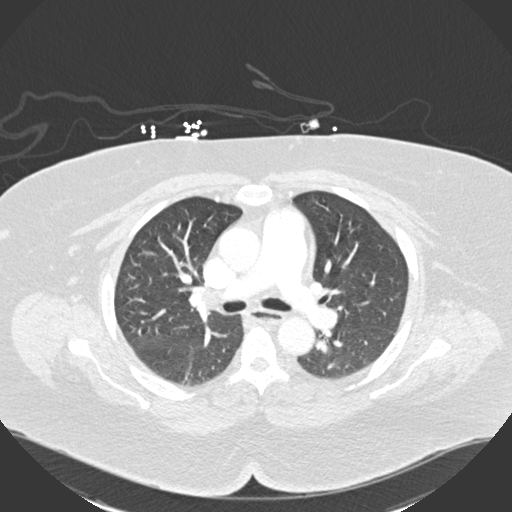
[im 143/220  mediastinal]
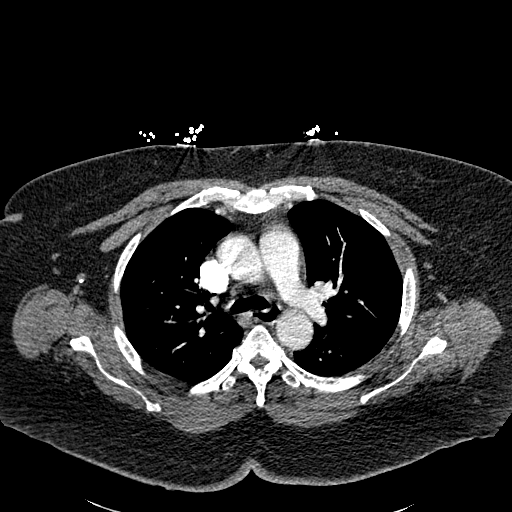
[im 154/220  lung]
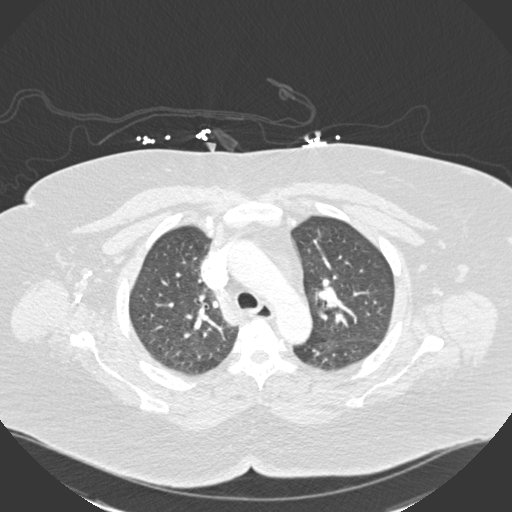
[im 176/220  mediastinal]
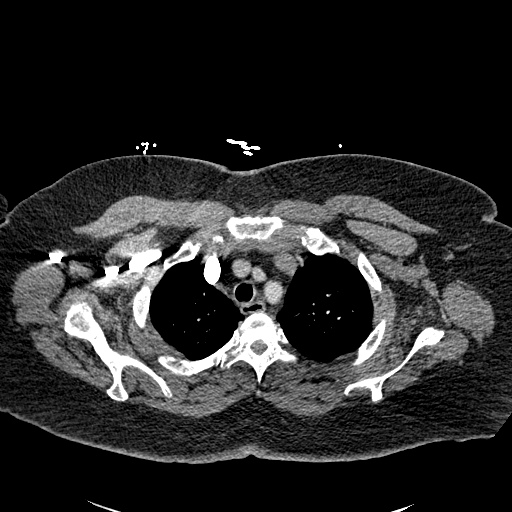
[im 187/220  lung]
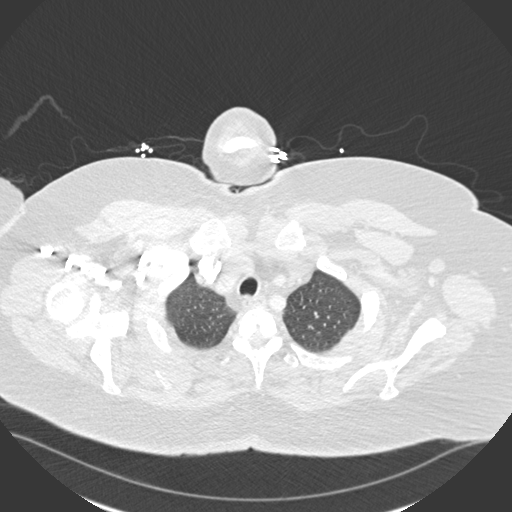
[im 198/220  mediastinal]
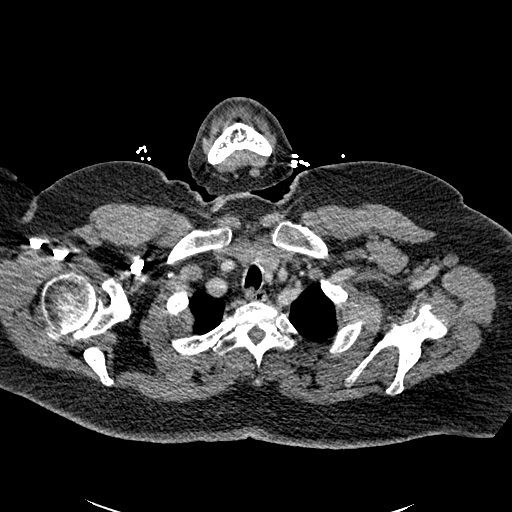
[im 209/220  lung]
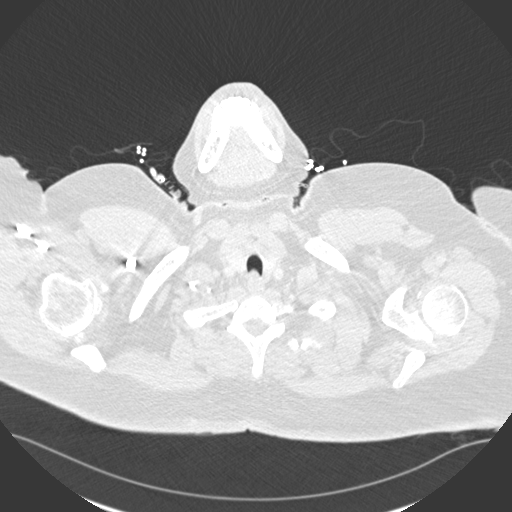

[Series 8: coronal mpr · coronal · 0.43mm/px · 1 of 165 slices shown]
[im 83/165  mediastinal]
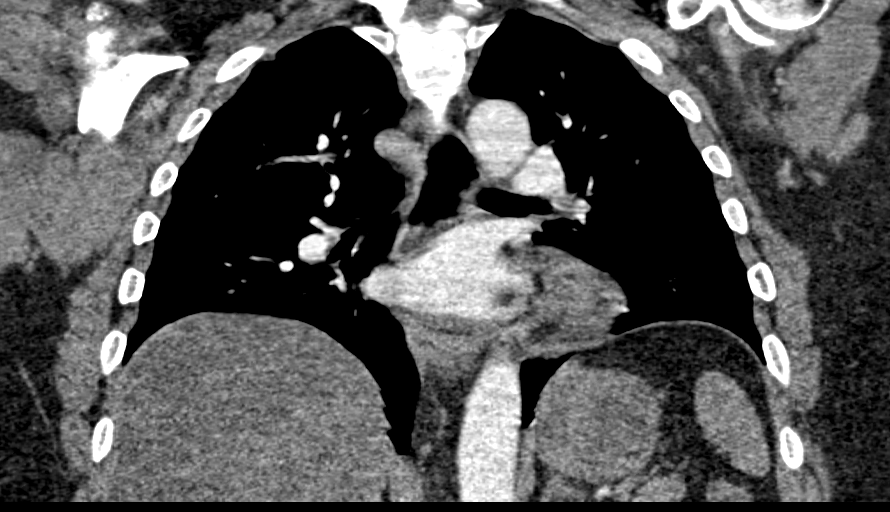

[18 of 36 positions shown; findings below may reference images not displayed]

FINDINGS: Cardiovascular: Satisfactory opacification of the pulmonary arteries
to the segmental level. No evidence of pulmonary embolism. Normal
heart size. No pericardial effusion.

Mediastinum/Nodes: No enlarged mediastinal, hilar, or axillary lymph
nodes. Thyroid gland, trachea, and esophagus demonstrate no
significant findings.

Lungs/Pleura: Lungs are clear. No pleural effusion or pneumothorax.

Upper Abdomen: Fatty infiltration of the liver is noted.

Musculoskeletal: No chest wall abnormality. No acute or significant
osseous findings.

Review of the MIP images confirms the above findings.
IMPRESSION: No evidence of pulmonary embolus. Fatty infiltration of the liver.
No other abnormality seen in the chest.

## 2018-05-30 IMAGING — CR DG CHEST 2V
2 series · 2 of 2 positions shown · non-contrast
Comparison: PA and lateral chest X ray dated February 28, 2016

CLINICAL DATA: Seven onset of mid and left chest pain last night
radiating into the left arm and jaw all associated with shortness of
breath. History of diabetes, dyslipidemia, nonsmoker.

EXAM:
CHEST  2 VIEW

[w chest pa]
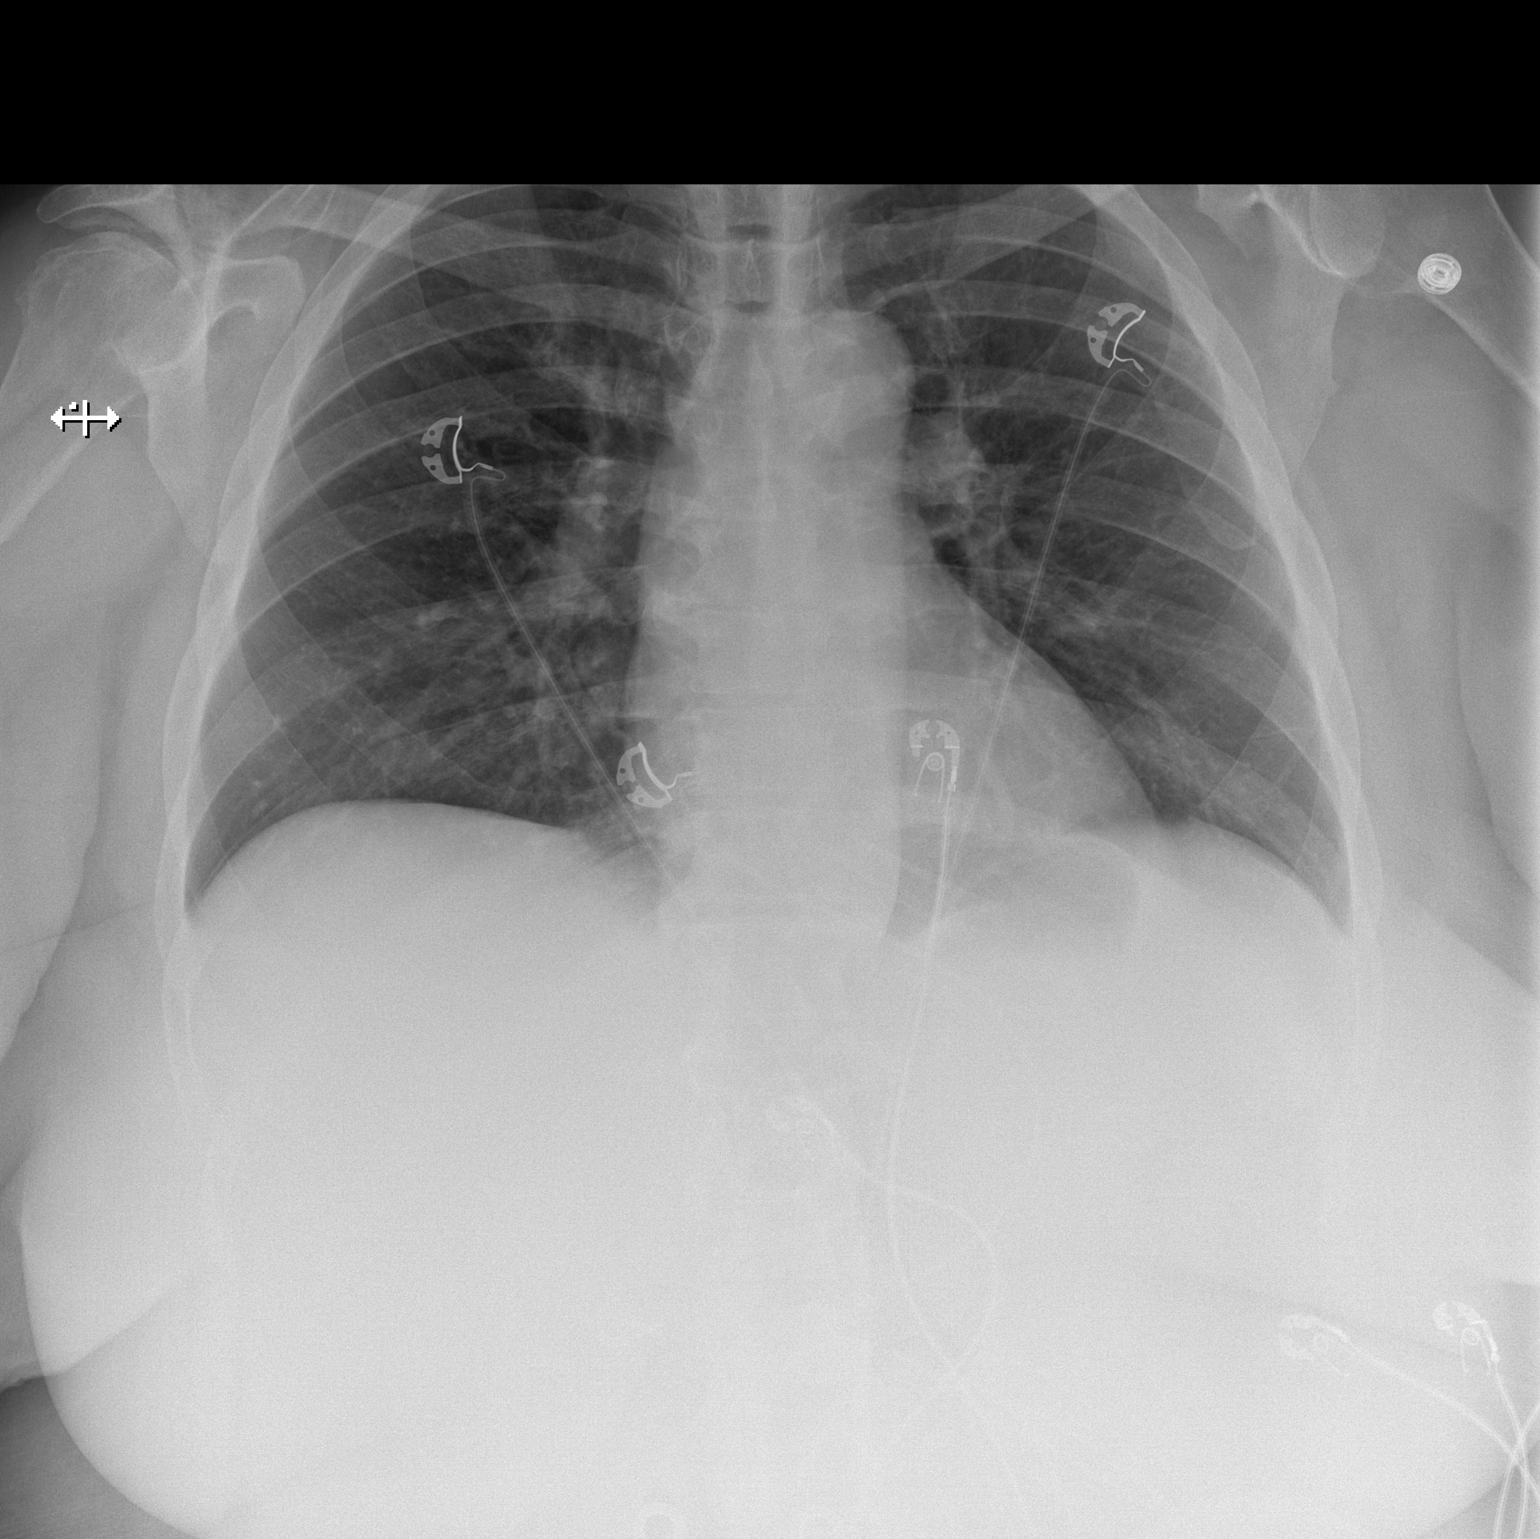

[w chest lat]
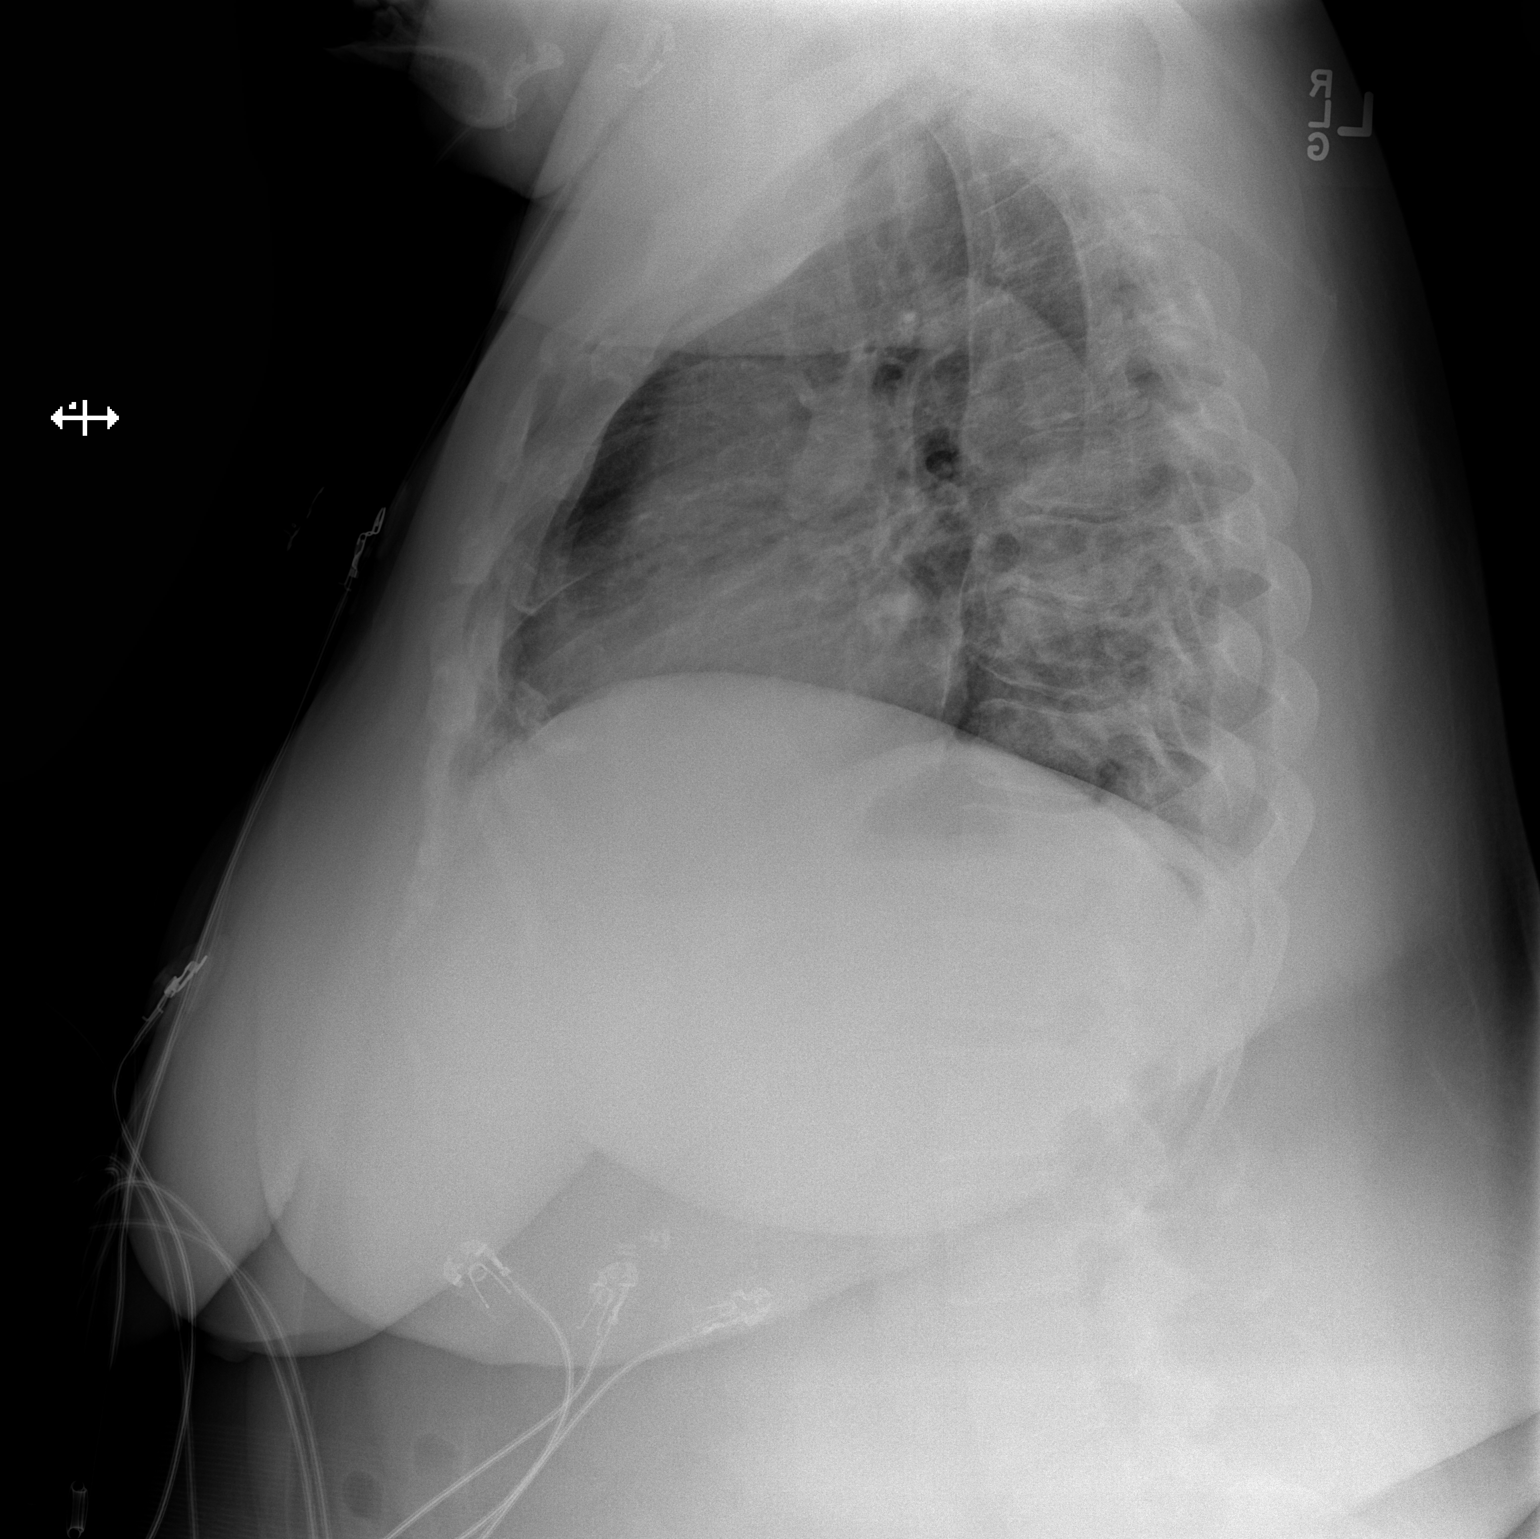

[2 of 2 positions shown; findings below may reference images not displayed]

FINDINGS: The lungs are adequately inflated. There is no focal infiltrate.
There is no pleural effusion or pneumothorax. The heart and
pulmonary vascularity are normal. The mediastinum is normal in
width. The trachea is midline. The bony thorax exhibits no acute
abnormality.
IMPRESSION: There is no pneumonia nor other acute cardiopulmonary abnormality.

## 2018-06-02 IMAGING — CR DG ABDOMEN 2V
3 series · 3 of 3 positions shown · non-contrast
Comparison: None.

CLINICAL DATA: Initial evaluation for acute mid abdominal pain.
History of diabetes, gastric ulcer, gastroparesis, prior
cholecystectomy. The

EXAM:
ABDOMEN - 2 VIEW

[w abdomen upright]
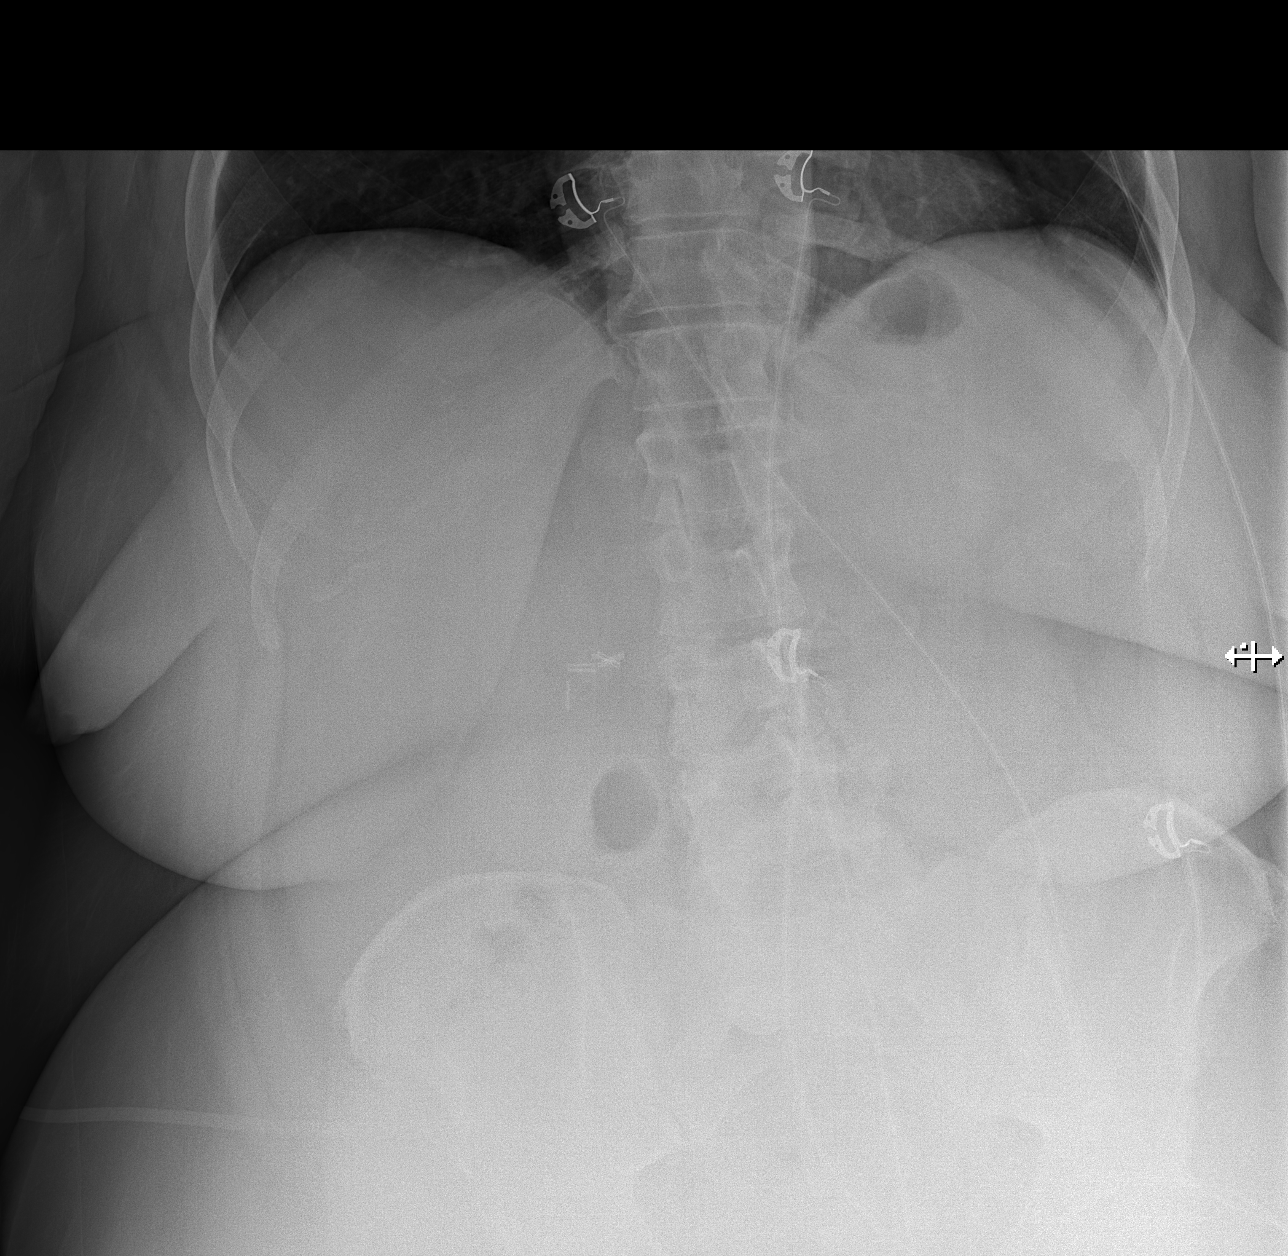

[t abdomen supine (1 of 2)]
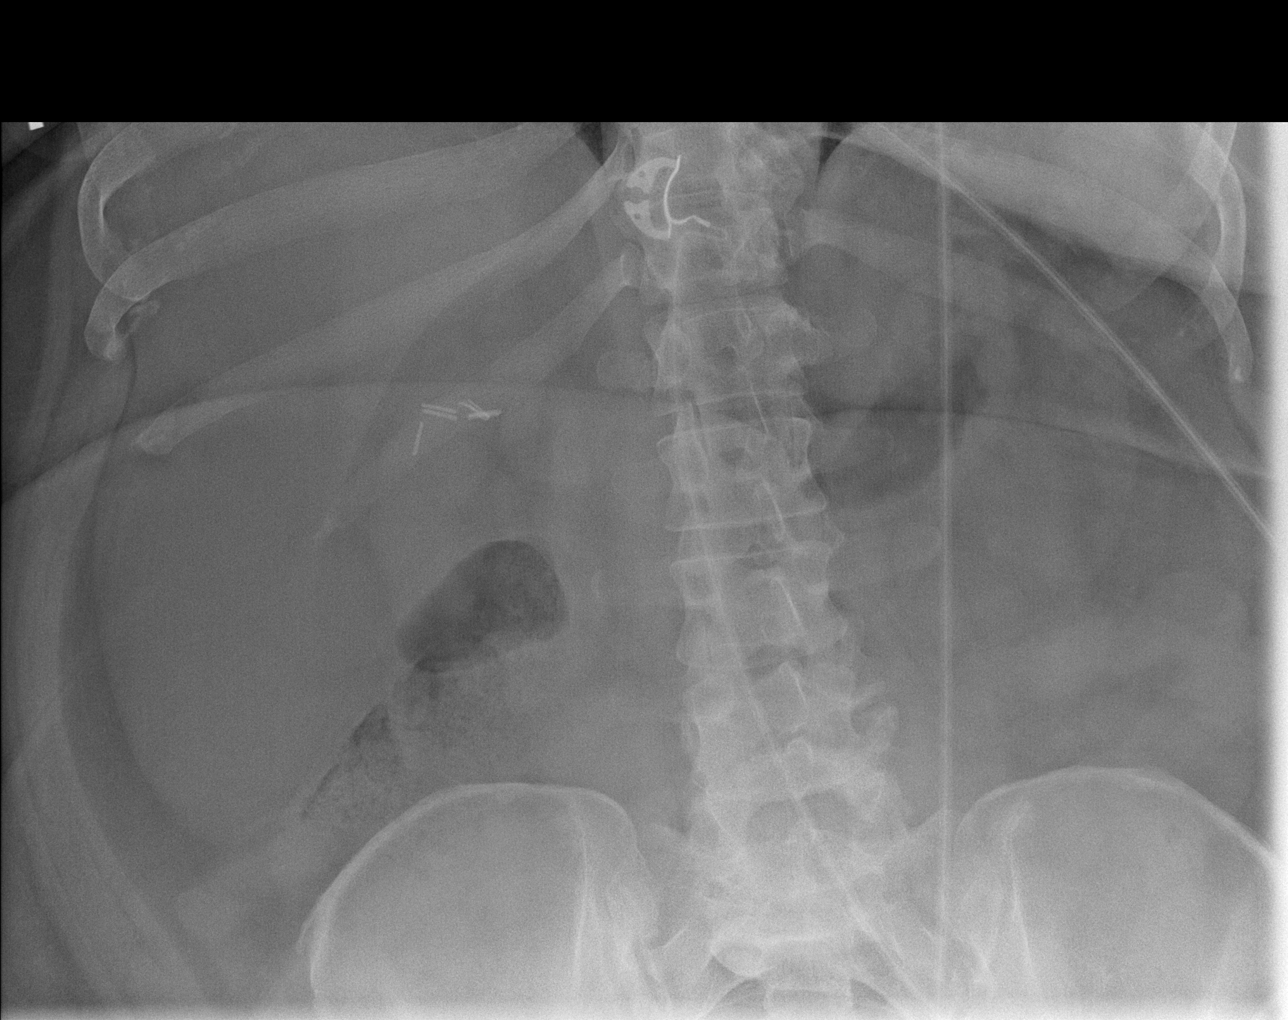

[t abdomen supine (2 of 2)]
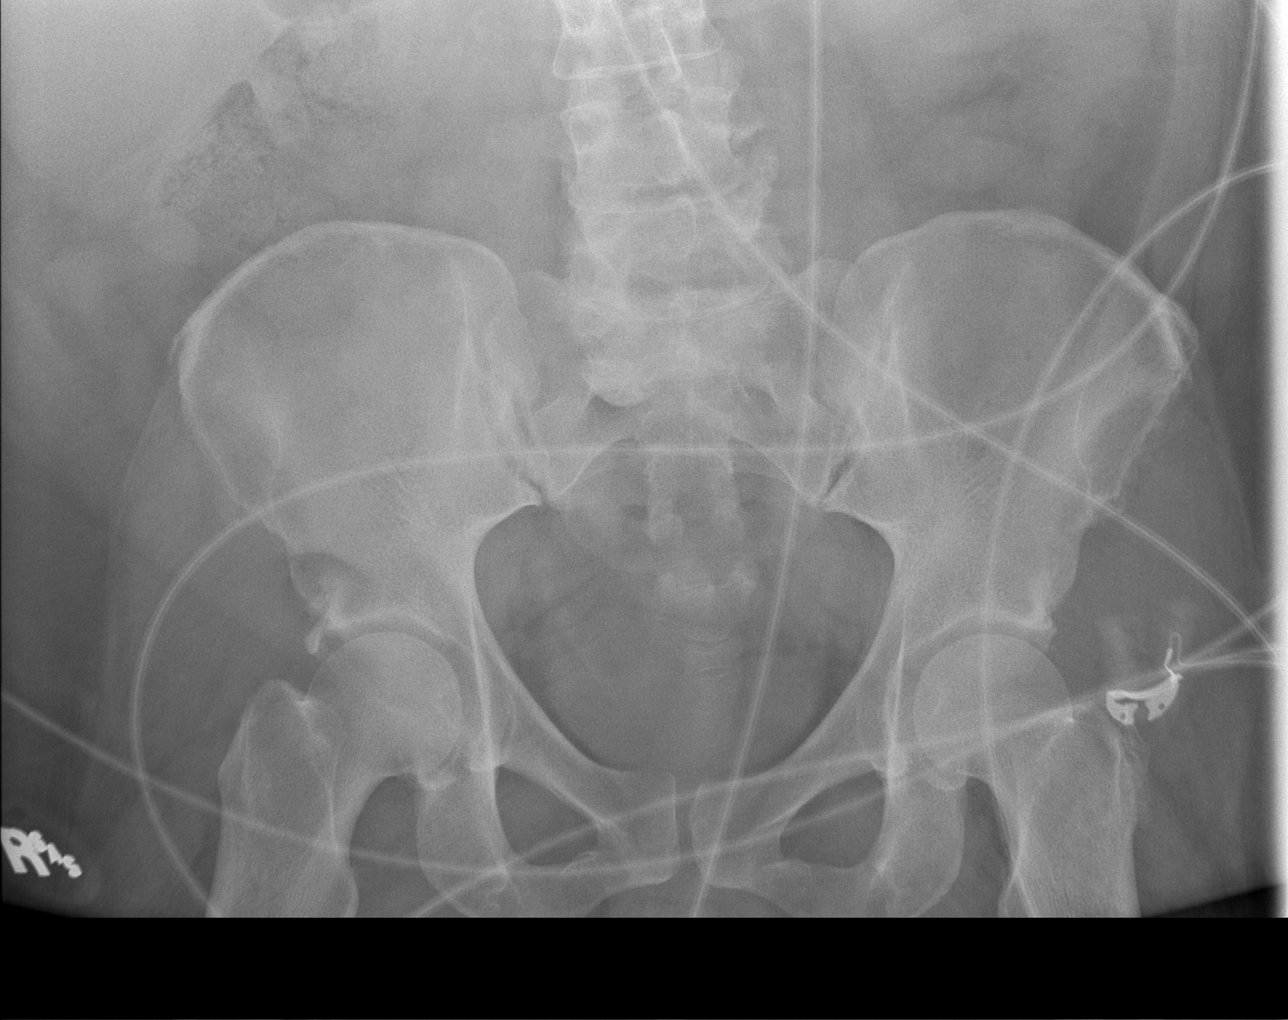

[3 of 3 positions shown; findings below may reference images not displayed]

FINDINGS: Paucity of gas limits evaluation of the bowels. No evidence for
obstruction or ileus. No free air. No soft tissue mass or abnormal
calcification. Cholecystectomy clips noted.

Visualized lung bases are clear. No acute osseus abnormality.
Degenerative changes noted about the hips bilaterally.
IMPRESSION: Nonobstructive bowel gas pattern with no radiographic evidence for
acute intra-abdominal process.

## 2018-06-02 IMAGING — DX DG CHEST 2V
2 series · 2 of 2 positions shown · non-contrast
Comparison: 04/20/2016

CLINICAL DATA: Central chest pains and all over abd pain for 3-4
days. SOB. Weakness. Fever. N/V for past 3 days. Pt takes HTN meds.
IDDM. Nonsmoker.

EXAM:
CHEST  2 VIEW

[chest pa]
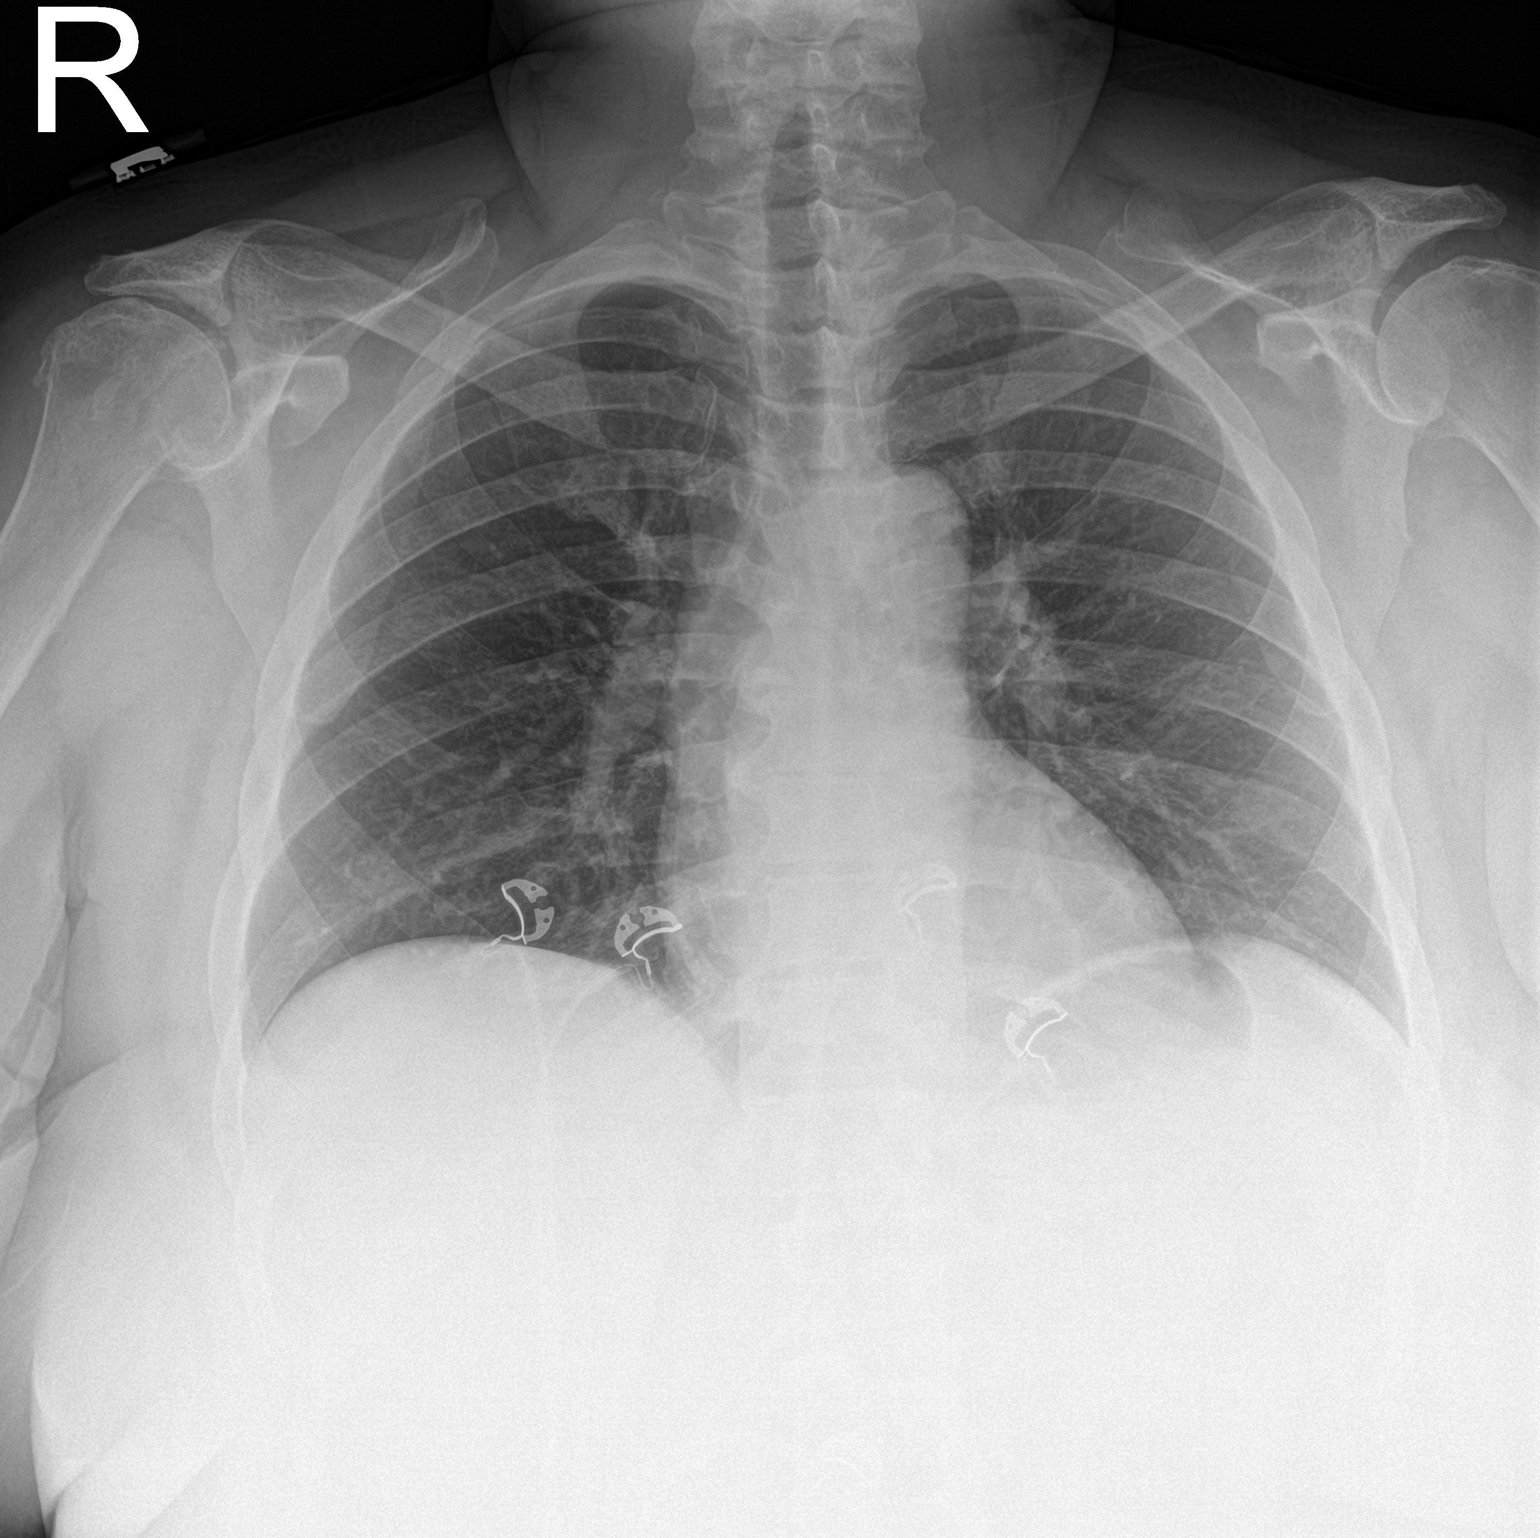

[chest lat]
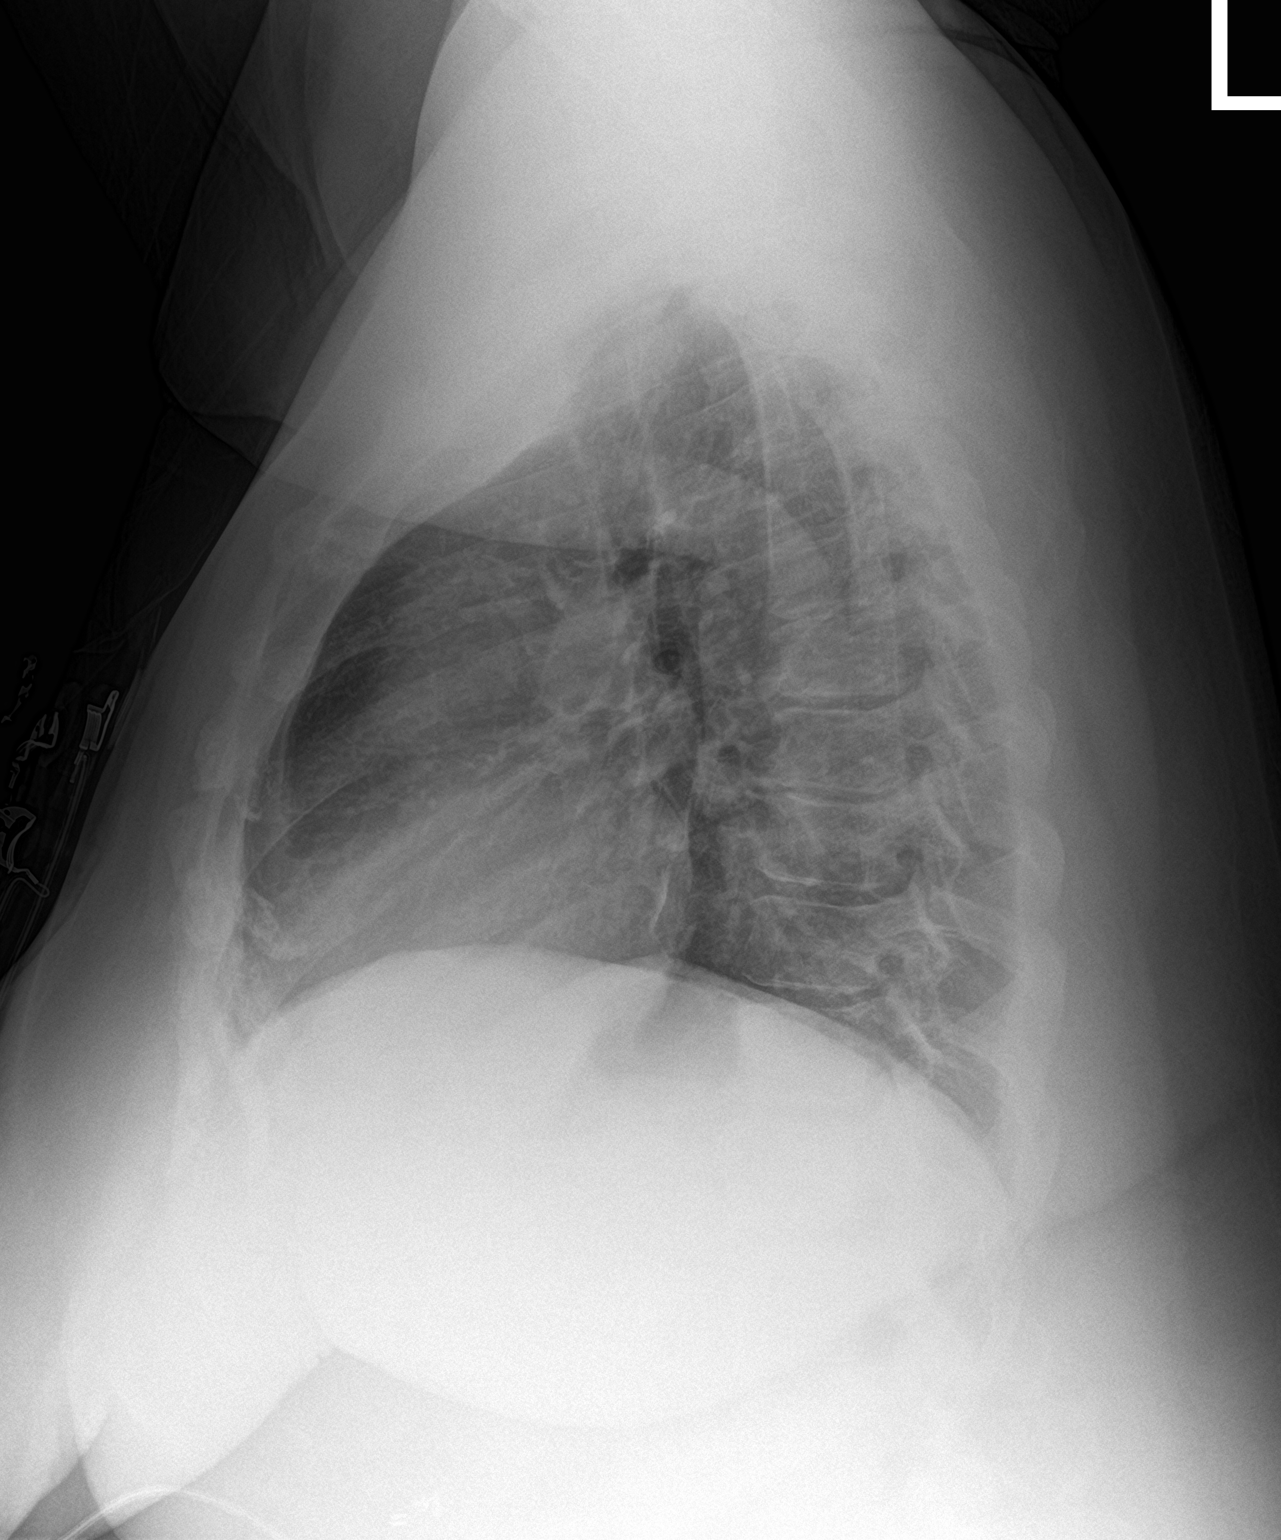

[2 of 2 positions shown; findings below may reference images not displayed]

FINDINGS: Lateral view degraded by patient arm position. Midline trachea.
Normal heart size and mediastinal contours. No pleural effusion or
pneumothorax. Clear lungs.
IMPRESSION: No acute cardiopulmonary disease.

## 2018-06-04 IMAGING — DX DG CHEST 2V
2 series · 2 of 2 positions shown · non-contrast
Comparison: Chest radiograph performed 04/23/2016

CLINICAL DATA: Acute onset of central chest pain. Initial
encounter.

EXAM:
CHEST  2 VIEW

[chest pa]
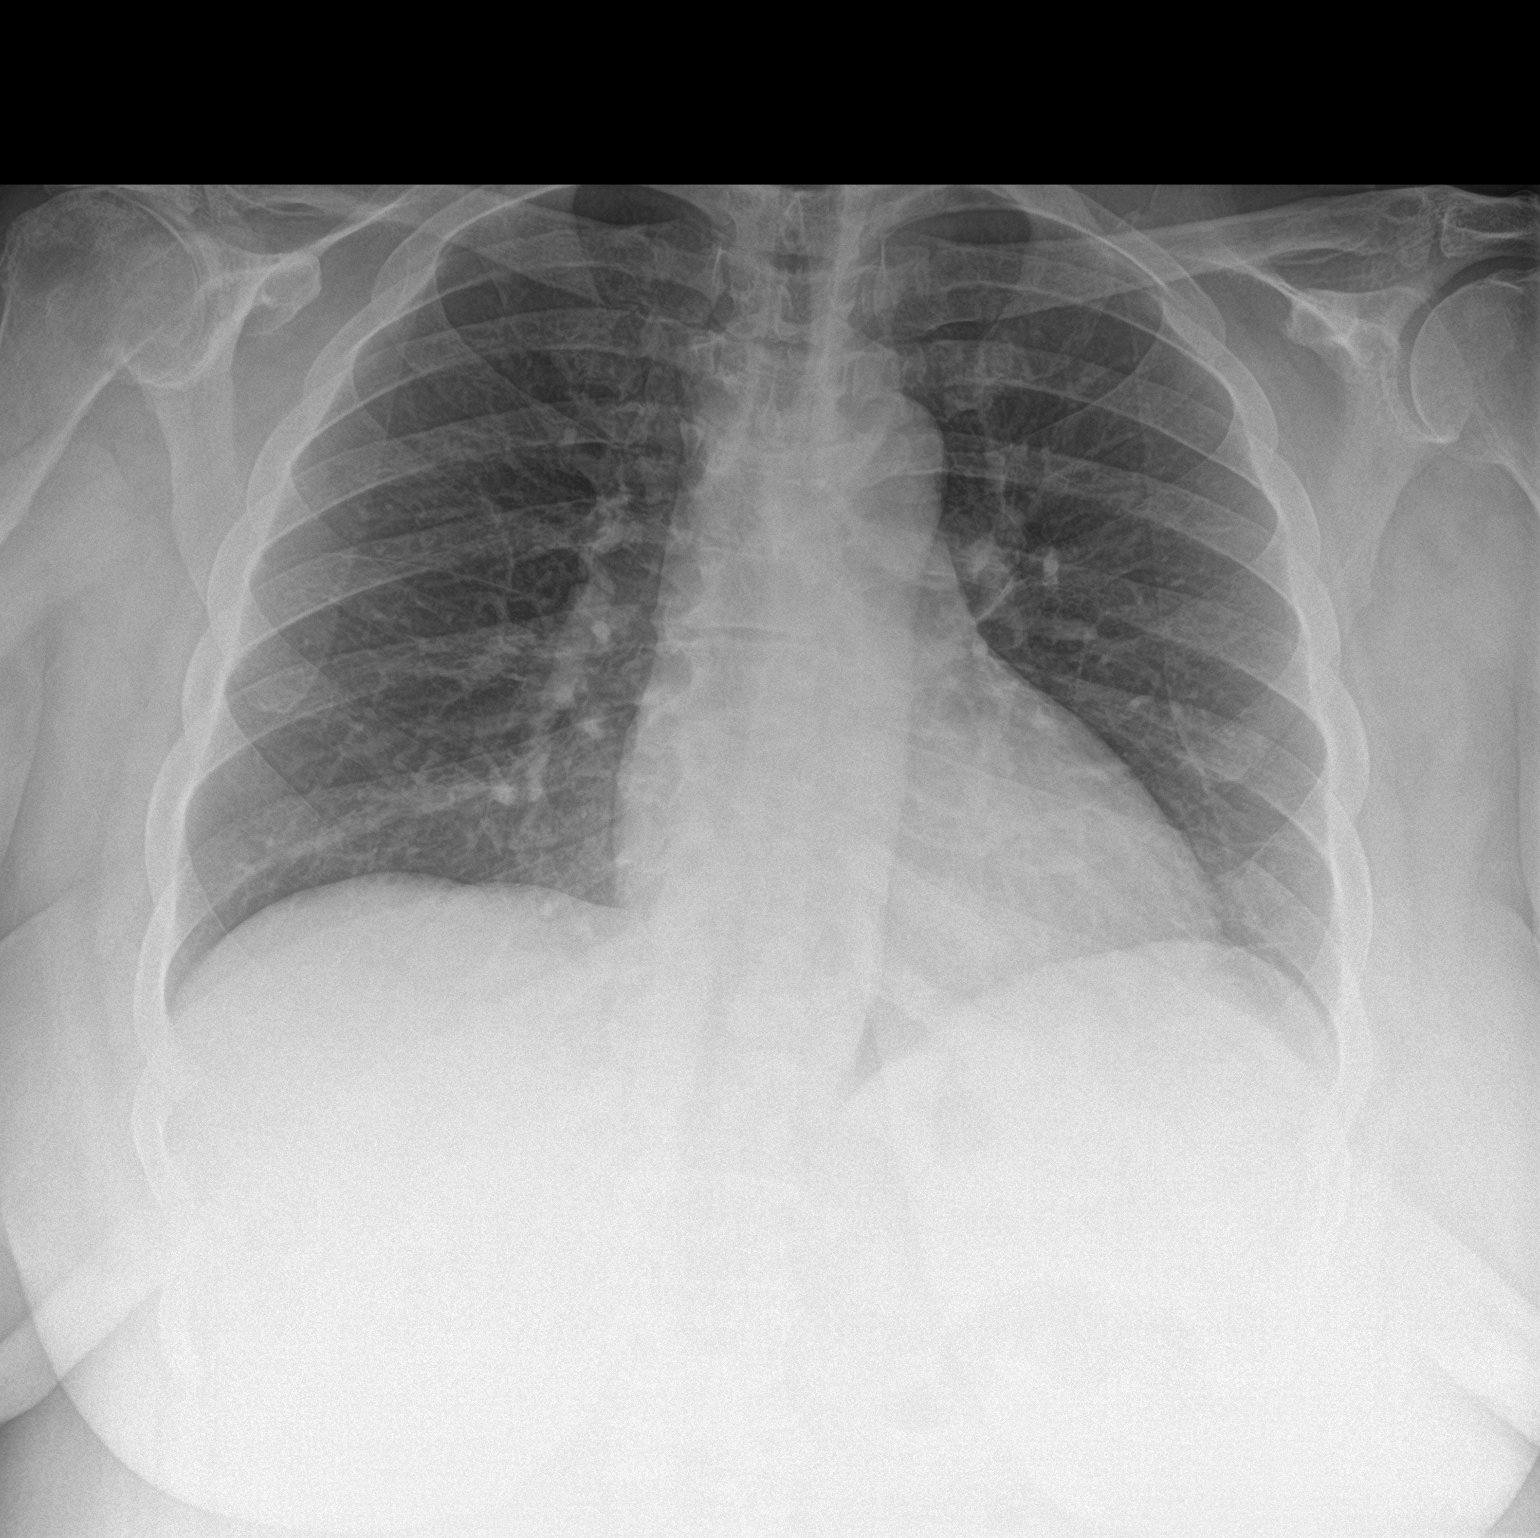

[chest lat]
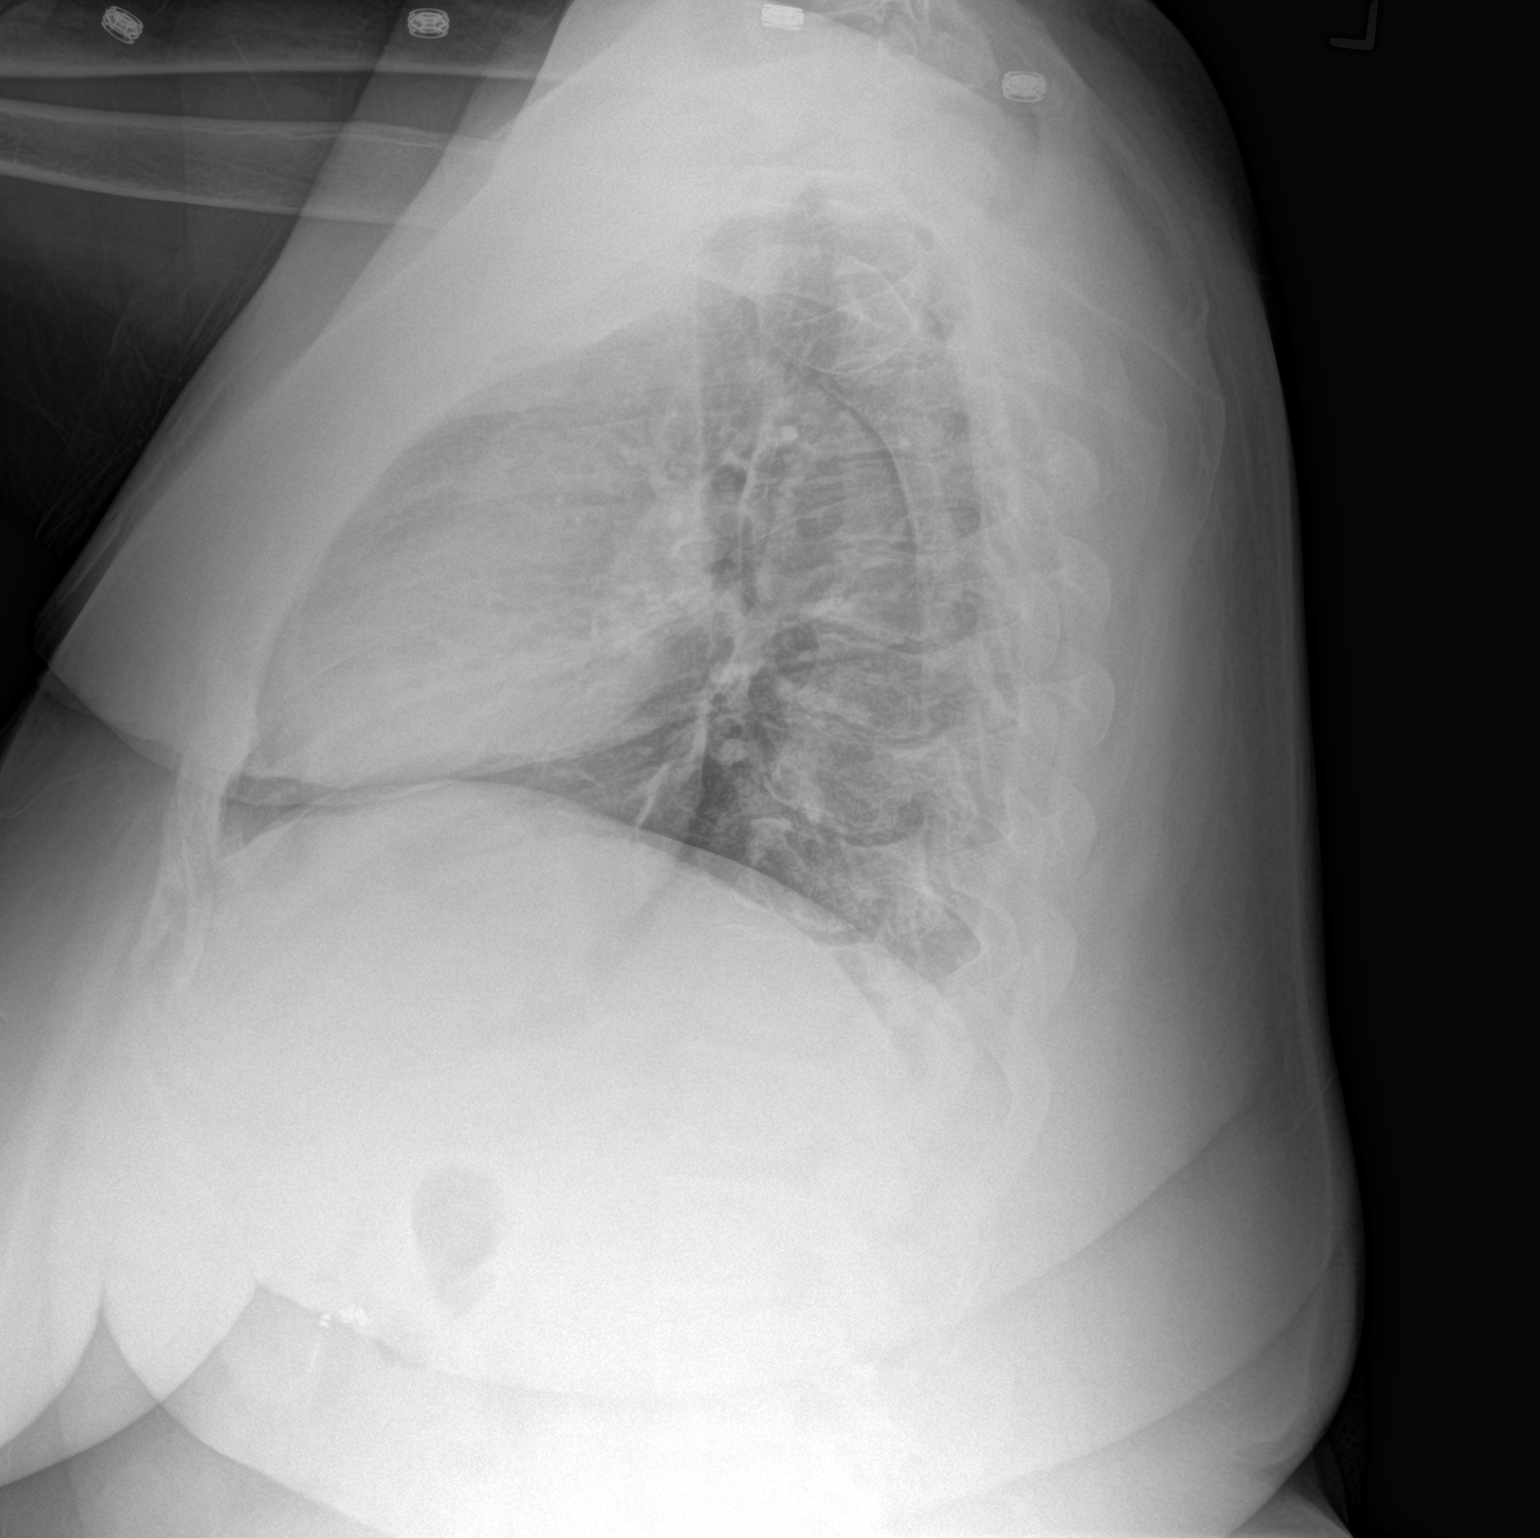

[2 of 2 positions shown; findings below may reference images not displayed]

FINDINGS: The lungs are well-aerated. Pulmonary vascularity is at the upper
limits of normal. Mild peribronchial thickening is noted. There is
no evidence of focal opacification, pleural effusion or
pneumothorax.

The heart is normal in size; the mediastinal contour is within
normal limits. No acute osseous abnormalities are seen.
IMPRESSION: Mild peribronchial thickening noted.  Lungs otherwise grossly clear.

## 2018-07-15 IMAGING — CR DG CHEST 2V
2 series · 2 of 2 positions shown · non-contrast
Comparison: 04/25/2016

CLINICAL DATA: She c/o SOB and chest pain and upper abd pain since
yesterday evening. Pt has no other chest complaints. PT Hx:
diabetic, HTN, pneumonia

EXAM:
CHEST - 2 VIEW

[w chest pa]
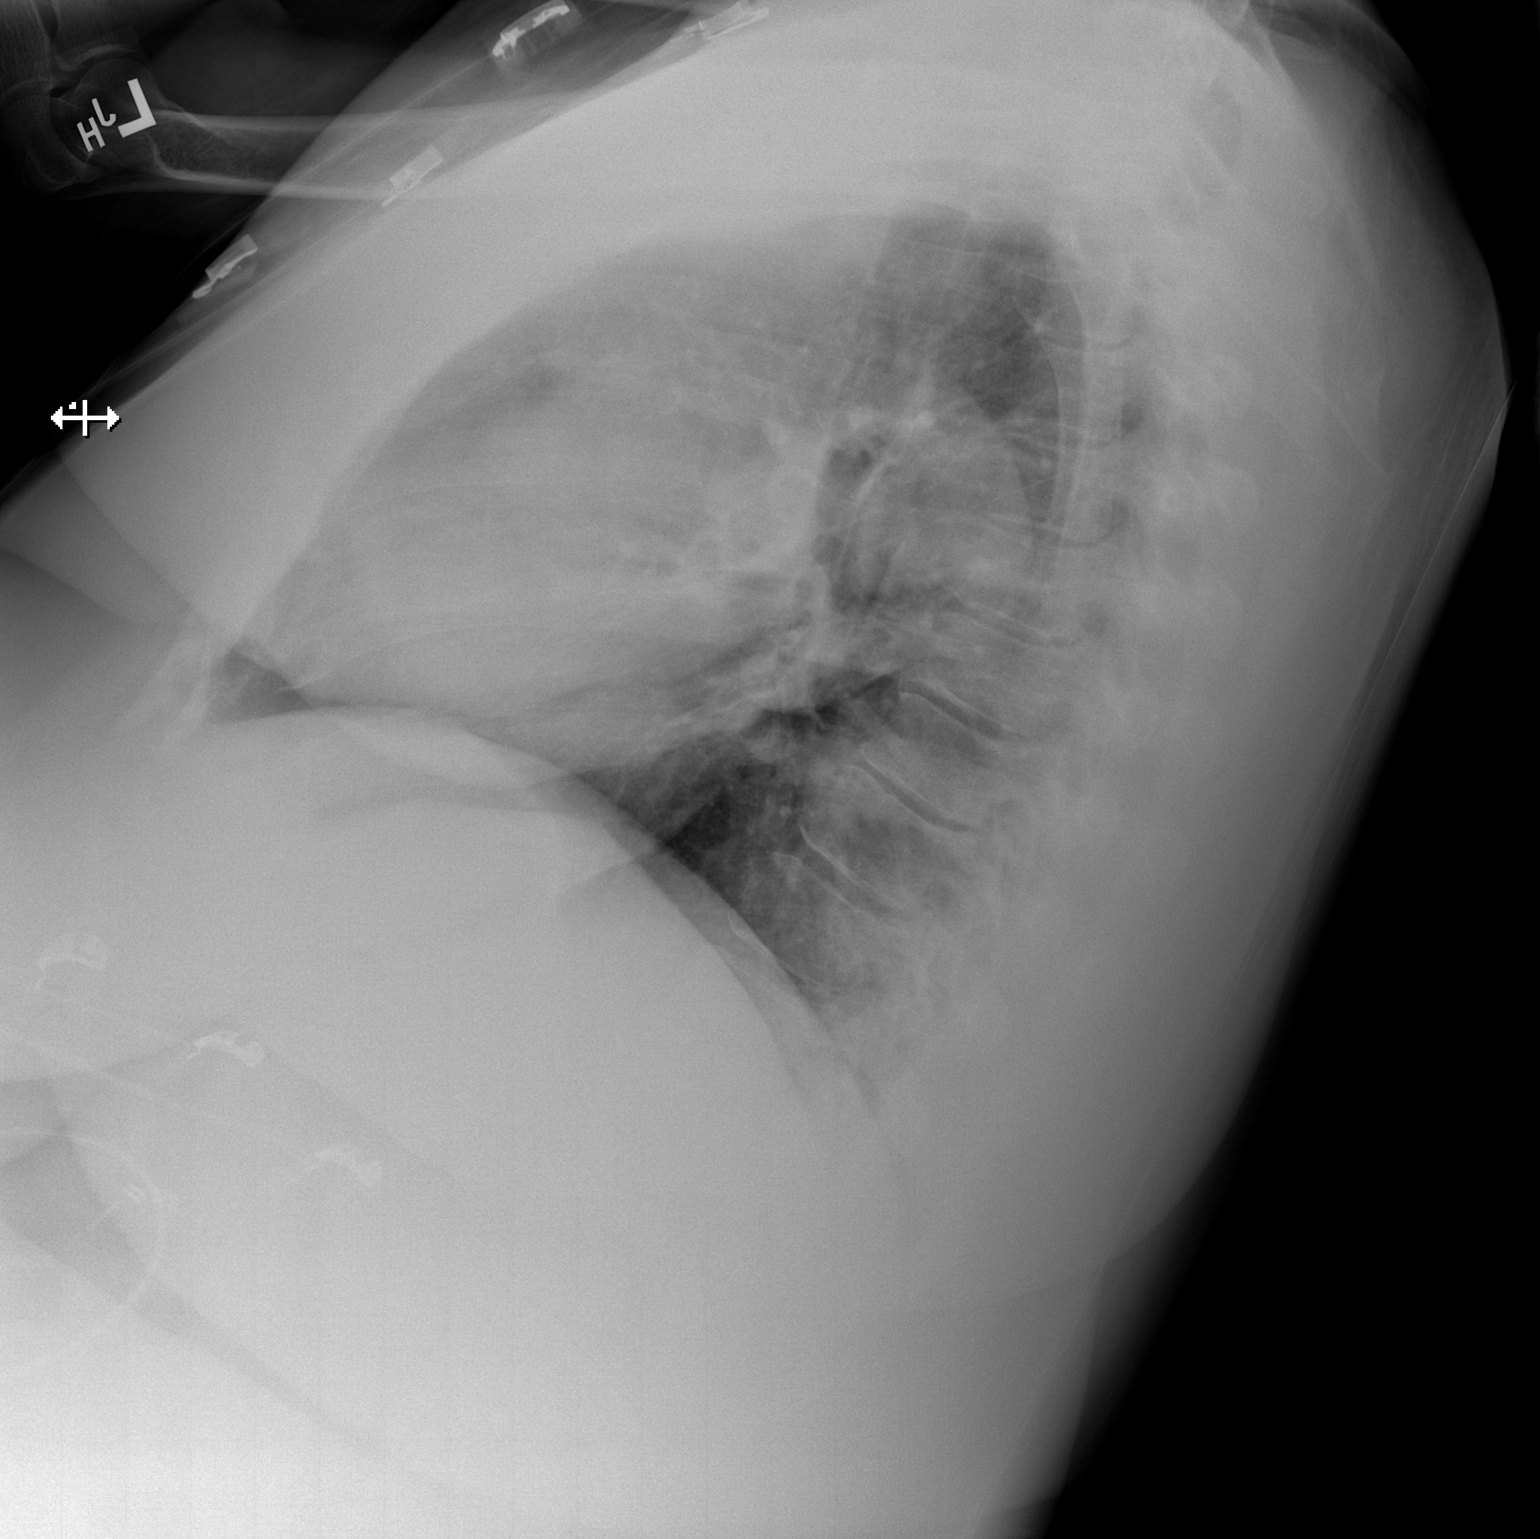

[x chest ap]
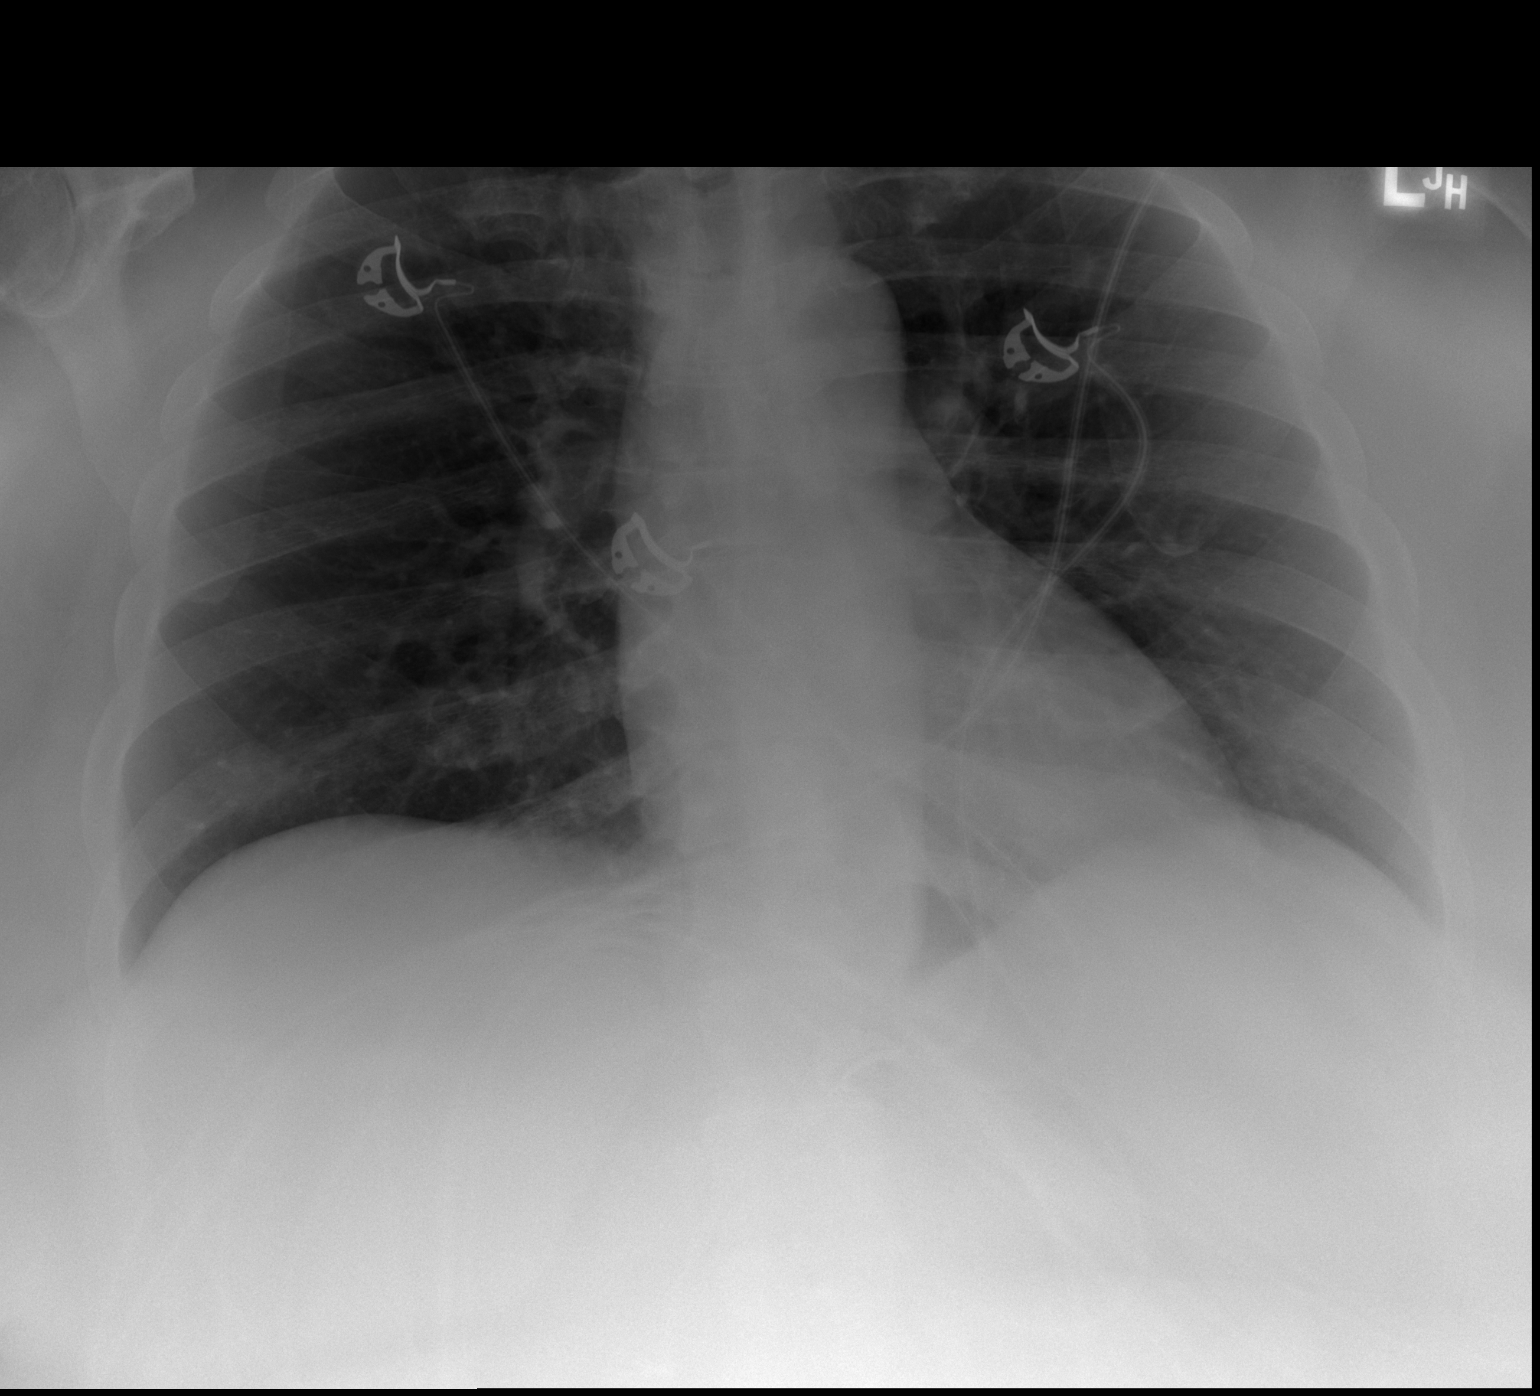

[2 of 2 positions shown; findings below may reference images not displayed]

FINDINGS: Lungs are clear.

Heart size and mediastinal contours are within normal limits.

No effusion.

Visualized bones unremarkable.
IMPRESSION: No acute cardiopulmonary disease.

## 2018-07-17 ENCOUNTER — Observation Stay (HOSPITAL_COMMUNITY): Payer: Medicaid Other

## 2018-07-17 ENCOUNTER — Encounter (HOSPITAL_COMMUNITY): Payer: Self-pay | Admitting: Emergency Medicine

## 2018-07-17 ENCOUNTER — Inpatient Hospital Stay (HOSPITAL_COMMUNITY)
Admission: EM | Admit: 2018-07-17 | Discharge: 2018-07-20 | DRG: 074 | Disposition: A | Discharge status: Skilled Nursing Facility | Payer: Medicaid Other | Attending: Student | Admitting: Student

## 2018-07-17 ENCOUNTER — Emergency Department (HOSPITAL_COMMUNITY): Payer: Medicaid Other

## 2018-07-17 ENCOUNTER — Other Ambulatory Visit: Payer: Self-pay

## 2018-07-17 DIAGNOSIS — E875 Hyperkalemia: Secondary | ICD-10-CM | POA: Diagnosis present

## 2018-07-17 DIAGNOSIS — K219 Gastro-esophageal reflux disease without esophagitis: Secondary | ICD-10-CM | POA: Diagnosis present

## 2018-07-17 DIAGNOSIS — N184 Chronic kidney disease, stage 4 (severe): Secondary | ICD-10-CM | POA: Diagnosis present

## 2018-07-17 DIAGNOSIS — Z20828 Contact with and (suspected) exposure to other viral communicable diseases: Secondary | ICD-10-CM | POA: Diagnosis present

## 2018-07-17 DIAGNOSIS — E1165 Type 2 diabetes mellitus with hyperglycemia: Secondary | ICD-10-CM | POA: Diagnosis present

## 2018-07-17 DIAGNOSIS — R0600 Dyspnea, unspecified: Secondary | ICD-10-CM

## 2018-07-17 DIAGNOSIS — E1122 Type 2 diabetes mellitus with diabetic chronic kidney disease: Secondary | ICD-10-CM | POA: Diagnosis present

## 2018-07-17 DIAGNOSIS — R739 Hyperglycemia, unspecified: Secondary | ICD-10-CM

## 2018-07-17 DIAGNOSIS — Z8673 Personal history of transient ischemic attack (TIA), and cerebral infarction without residual deficits: Secondary | ICD-10-CM

## 2018-07-17 DIAGNOSIS — N1832 Chronic kidney disease, stage 3b: Secondary | ICD-10-CM | POA: Diagnosis present

## 2018-07-17 DIAGNOSIS — E669 Obesity, unspecified: Secondary | ICD-10-CM | POA: Diagnosis present

## 2018-07-17 DIAGNOSIS — E785 Hyperlipidemia, unspecified: Secondary | ICD-10-CM | POA: Diagnosis present

## 2018-07-17 DIAGNOSIS — Z880 Allergy status to penicillin: Secondary | ICD-10-CM

## 2018-07-17 DIAGNOSIS — N183 Chronic kidney disease, stage 3 (moderate): Secondary | ICD-10-CM | POA: Diagnosis present

## 2018-07-17 DIAGNOSIS — E860 Dehydration: Secondary | ICD-10-CM | POA: Diagnosis present

## 2018-07-17 DIAGNOSIS — G8929 Other chronic pain: Secondary | ICD-10-CM | POA: Diagnosis present

## 2018-07-17 DIAGNOSIS — N182 Chronic kidney disease, stage 2 (mild): Secondary | ICD-10-CM | POA: Diagnosis present

## 2018-07-17 DIAGNOSIS — Z91041 Radiographic dye allergy status: Secondary | ICD-10-CM

## 2018-07-17 DIAGNOSIS — N179 Acute kidney failure, unspecified: Secondary | ICD-10-CM | POA: Diagnosis present

## 2018-07-17 DIAGNOSIS — Z888 Allergy status to other drugs, medicaments and biological substances status: Secondary | ICD-10-CM

## 2018-07-17 DIAGNOSIS — R079 Chest pain, unspecified: Secondary | ICD-10-CM | POA: Diagnosis present

## 2018-07-17 DIAGNOSIS — M797 Fibromyalgia: Secondary | ICD-10-CM | POA: Diagnosis present

## 2018-07-17 DIAGNOSIS — K760 Fatty (change of) liver, not elsewhere classified: Secondary | ICD-10-CM | POA: Diagnosis present

## 2018-07-17 DIAGNOSIS — R112 Nausea with vomiting, unspecified: Secondary | ICD-10-CM | POA: Diagnosis not present

## 2018-07-17 DIAGNOSIS — Z7982 Long term (current) use of aspirin: Secondary | ICD-10-CM

## 2018-07-17 DIAGNOSIS — D72829 Elevated white blood cell count, unspecified: Secondary | ICD-10-CM | POA: Diagnosis present

## 2018-07-17 DIAGNOSIS — R109 Unspecified abdominal pain: Secondary | ICD-10-CM | POA: Diagnosis present

## 2018-07-17 DIAGNOSIS — I10 Essential (primary) hypertension: Secondary | ICD-10-CM | POA: Diagnosis present

## 2018-07-17 DIAGNOSIS — Z91018 Allergy to other foods: Secondary | ICD-10-CM

## 2018-07-17 DIAGNOSIS — K3184 Gastroparesis: Secondary | ICD-10-CM | POA: Diagnosis present

## 2018-07-17 DIAGNOSIS — E1143 Type 2 diabetes mellitus with diabetic autonomic (poly)neuropathy: Principal | ICD-10-CM | POA: Diagnosis present

## 2018-07-17 DIAGNOSIS — Z794 Long term (current) use of insulin: Secondary | ICD-10-CM

## 2018-07-17 DIAGNOSIS — Z6841 Body Mass Index (BMI) 40.0 and over, adult: Secondary | ICD-10-CM

## 2018-07-17 DIAGNOSIS — R1084 Generalized abdominal pain: Secondary | ICD-10-CM

## 2018-07-17 DIAGNOSIS — Z886 Allergy status to analgesic agent status: Secondary | ICD-10-CM

## 2018-07-17 DIAGNOSIS — Z833 Family history of diabetes mellitus: Secondary | ICD-10-CM

## 2018-07-17 DIAGNOSIS — I129 Hypertensive chronic kidney disease with stage 1 through stage 4 chronic kidney disease, or unspecified chronic kidney disease: Secondary | ICD-10-CM | POA: Diagnosis present

## 2018-07-17 DIAGNOSIS — R1110 Vomiting, unspecified: Secondary | ICD-10-CM | POA: Diagnosis present

## 2018-07-17 DIAGNOSIS — Z8249 Family history of ischemic heart disease and other diseases of the circulatory system: Secondary | ICD-10-CM

## 2018-07-17 DIAGNOSIS — E1140 Type 2 diabetes mellitus with diabetic neuropathy, unspecified: Secondary | ICD-10-CM

## 2018-07-17 DIAGNOSIS — Z79899 Other long term (current) drug therapy: Secondary | ICD-10-CM

## 2018-07-17 DIAGNOSIS — IMO0002 Reserved for concepts with insufficient information to code with codable children: Secondary | ICD-10-CM

## 2018-07-17 DIAGNOSIS — M109 Gout, unspecified: Secondary | ICD-10-CM | POA: Diagnosis present

## 2018-07-17 LAB — CBC WITH DIFFERENTIAL/PLATELET
Abs Immature Granulocytes: 0.11 10*3/uL — ABNORMAL HIGH (ref 0.00–0.07)
Basophils Absolute: 0 10*3/uL (ref 0.0–0.1)
Basophils Relative: 0 %
Eosinophils Absolute: 0 10*3/uL (ref 0.0–0.5)
Eosinophils Relative: 0 %
HCT: 31.5 % — ABNORMAL LOW (ref 36.0–46.0)
Hemoglobin: 10 g/dL — ABNORMAL LOW (ref 12.0–15.0)
Immature Granulocytes: 1 %
Lymphocytes Relative: 11 %
Lymphs Abs: 1.7 10*3/uL (ref 0.7–4.0)
MCH: 28.5 pg (ref 26.0–34.0)
MCHC: 31.7 g/dL (ref 30.0–36.0)
MCV: 89.7 fL (ref 80.0–100.0)
Monocytes Absolute: 0.5 10*3/uL (ref 0.1–1.0)
Monocytes Relative: 3 %
Neutro Abs: 13.6 10*3/uL — ABNORMAL HIGH (ref 1.7–7.7)
Neutrophils Relative %: 85 %
Platelets: 343 10*3/uL (ref 150–400)
RBC: 3.51 MIL/uL — ABNORMAL LOW (ref 3.87–5.11)
RDW: 14.4 % (ref 11.5–15.5)
WBC: 15.9 10*3/uL — ABNORMAL HIGH (ref 4.0–10.5)
nRBC: 0 % (ref 0.0–0.2)

## 2018-07-17 LAB — BASIC METABOLIC PANEL
Anion gap: 11 (ref 5–15)
BUN: 30 mg/dL — ABNORMAL HIGH (ref 6–20)
CO2: 22 mmol/L (ref 22–32)
Calcium: 9 mg/dL (ref 8.9–10.3)
Chloride: 103 mmol/L (ref 98–111)
Creatinine, Ser: 1.82 mg/dL — ABNORMAL HIGH (ref 0.44–1.00)
GFR calc Af Amer: 36 mL/min — ABNORMAL LOW (ref >60)
GFR calc non Af Amer: 31 mL/min — ABNORMAL LOW (ref >60)
Glucose, Bld: 317 mg/dL — ABNORMAL HIGH (ref 70–99)
Potassium: 4.5 mmol/L (ref 3.5–5.1)
Sodium: 136 mmol/L (ref 135–145)

## 2018-07-17 LAB — COMPREHENSIVE METABOLIC PANEL
ALT: 29 U/L (ref 0–44)
AST: 34 U/L (ref 15–41)
Albumin: 3.6 g/dL (ref 3.5–5.0)
Alkaline Phosphatase: 99 U/L (ref 38–126)
Anion gap: 14 (ref 5–15)
BUN: 25 mg/dL — ABNORMAL HIGH (ref 6–20)
CO2: 21 mmol/L — ABNORMAL LOW (ref 22–32)
Calcium: 9.8 mg/dL (ref 8.9–10.3)
Chloride: 100 mmol/L (ref 98–111)
Creatinine, Ser: 1.58 mg/dL — ABNORMAL HIGH (ref 0.44–1.00)
GFR calc Af Amer: 43 mL/min — ABNORMAL LOW (ref >60)
GFR calc non Af Amer: 37 mL/min — ABNORMAL LOW (ref >60)
Glucose, Bld: 385 mg/dL — ABNORMAL HIGH (ref 70–99)
Potassium: 5.4 mmol/L — ABNORMAL HIGH (ref 3.5–5.1)
Sodium: 135 mmol/L (ref 135–145)
Total Bilirubin: 0.7 mg/dL (ref 0.3–1.2)
Total Protein: 7.9 g/dL (ref 6.5–8.1)

## 2018-07-17 LAB — URINALYSIS, ROUTINE W REFLEX MICROSCOPIC
Bacteria, UA: NONE SEEN
Bilirubin Urine: NEGATIVE
Glucose, UA: >=500 mg/dL — AB
Ketones, ur: NEGATIVE mg/dL
Leukocytes,Ua: NEGATIVE
Nitrite: NEGATIVE
Protein, ur: >=300 mg/dL — AB
Specific Gravity, Urine: 1.021 (ref 1.005–1.030)
pH: 5 (ref 5.0–8.0)

## 2018-07-17 LAB — HEMOGLOBIN A1C
Hgb A1c MFr Bld: 9.1 % — ABNORMAL HIGH (ref 4.8–5.6)
Mean Plasma Glucose: 214.47 mg/dL

## 2018-07-17 LAB — GLUCOSE, CAPILLARY
Glucose-Capillary: 229 mg/dL — ABNORMAL HIGH (ref 70–99)
Glucose-Capillary: 210 mg/dL — ABNORMAL HIGH (ref 70–99)

## 2018-07-17 LAB — LIPASE, BLOOD: Lipase: 27 U/L (ref 11–51)

## 2018-07-17 LAB — HIV ANTIBODY (ROUTINE TESTING W REFLEX): HIV Screen 4th Generation wRfx: NONREACTIVE

## 2018-07-17 LAB — TROPONIN I
Troponin I: <0.03 ng/mL (ref <0.03)
Troponin I: <0.03 ng/mL (ref <0.03)
Troponin I: <0.03 ng/mL (ref <0.03)

## 2018-07-17 LAB — MRSA PCR SCREENING: MRSA by PCR: NEGATIVE

## 2018-07-17 LAB — SARS CORONAVIRUS 2 BY RT PCR (HOSPITAL ORDER, PERFORMED IN ~~LOC~~ HOSPITAL LAB): SARS Coronavirus 2: NEGATIVE

## 2018-07-17 LAB — PROCALCITONIN: Procalcitonin: <0.1 ng/mL

## 2018-07-17 LAB — CBG MONITORING, ED: Glucose-Capillary: 347 mg/dL — ABNORMAL HIGH (ref 70–99)

## 2018-07-17 MED ORDER — MORPHINE SULFATE (PF) 2 MG/ML IV SOLN
1.0000 mg | INTRAVENOUS | Status: DC | PRN
  Administered 2018-07-17 – 2018-07-18 (×3): 1 mg via INTRAVENOUS
  Filled 2018-07-17 (×3): qty 1

## 2018-07-17 MED ORDER — MAGNESIUM SULFATE IN D5W 1-5 GM/100ML-% IV SOLN
1.0000 g | Freq: Once | INTRAVENOUS | Status: AC
  Administered 2018-07-17: 07:00:00 1 g via INTRAVENOUS
  Filled 2018-07-17: qty 100

## 2018-07-17 MED ORDER — METHOCARBAMOL 500 MG PO TABS
500.0000 mg | ORAL_TABLET | Freq: Two times a day (BID) | ORAL | Status: DC
  Administered 2018-07-17 – 2018-07-20 (×7): 500 mg via ORAL
  Filled 2018-07-17 (×7): qty 1

## 2018-07-17 MED ORDER — ONDANSETRON HCL 4 MG/2ML IJ SOLN: 4.0000 mg | Freq: Four times a day (QID) | INTRAMUSCULAR | Status: DC | PRN

## 2018-07-17 MED ORDER — INSULIN ASPART 100 UNIT/ML ~~LOC~~ SOLN: 10.0000 [IU] | Freq: Once | SUBCUTANEOUS | Status: DC

## 2018-07-17 MED ORDER — ALLOPURINOL 100 MG PO TABS
100.0000 mg | ORAL_TABLET | Freq: Every day | ORAL | Status: DC
  Administered 2018-07-18 – 2018-07-20 (×3): 100 mg via ORAL
  Filled 2018-07-17 (×4): qty 1

## 2018-07-17 MED ORDER — INSULIN GLARGINE 100 UNIT/ML ~~LOC~~ SOLN
40.0000 [IU] | Freq: Every day | SUBCUTANEOUS | Status: DC
  Administered 2018-07-17: 21:00:00 40 [IU] via SUBCUTANEOUS
  Filled 2018-07-17 (×2): qty 0.4

## 2018-07-17 MED ORDER — PANTOPRAZOLE SODIUM 40 MG PO TBEC
40.0000 mg | DELAYED_RELEASE_TABLET | Freq: Every day | ORAL | Status: DC
  Administered 2018-07-17 – 2018-07-20 (×4): 40 mg via ORAL
  Filled 2018-07-17 (×4): qty 1

## 2018-07-17 MED ORDER — MELATONIN 3 MG PO TABS
6.0000 mg | ORAL_TABLET | Freq: Every day | ORAL | Status: DC
  Administered 2018-07-17 – 2018-07-19 (×3): 6 mg via ORAL
  Filled 2018-07-17 (×5): qty 2

## 2018-07-17 MED ORDER — INSULIN ASPART 100 UNIT/ML ~~LOC~~ SOLN
0.0000 [IU] | Freq: Three times a day (TID) | SUBCUTANEOUS | Status: DC
  Administered 2018-07-17: 11 [IU] via SUBCUTANEOUS
  Administered 2018-07-17: 5 [IU] via SUBCUTANEOUS
  Administered 2018-07-18: 16:00:00 8 [IU] via SUBCUTANEOUS
  Administered 2018-07-18 (×2): 5 [IU] via SUBCUTANEOUS
  Administered 2018-07-19: 3 [IU] via SUBCUTANEOUS
  Administered 2018-07-19 (×2): 8 [IU] via SUBCUTANEOUS
  Administered 2018-07-20: 08:00:00 5 [IU] via SUBCUTANEOUS

## 2018-07-17 MED ORDER — PANTOPRAZOLE SODIUM 40 MG PO TBEC: 40.0000 mg | DELAYED_RELEASE_TABLET | Freq: Every day | ORAL | Status: DC

## 2018-07-17 MED ORDER — SODIUM CHLORIDE 0.9 % IV BOLUS (SEPSIS)
1000.0000 mL | Freq: Once | INTRAVENOUS | Status: AC
  Administered 2018-07-17: 05:00:00 1000 mL via INTRAVENOUS

## 2018-07-17 MED ORDER — MORPHINE SULFATE (PF) 2 MG/ML IV SOLN
1.0000 mg | INTRAVENOUS | Status: DC | PRN
  Administered 2018-07-17: 1 mg via INTRAVENOUS
  Filled 2018-07-17: qty 1

## 2018-07-17 MED ORDER — ONDANSETRON HCL 4 MG/2ML IJ SOLN
4.0000 mg | INTRAMUSCULAR | Status: AC
  Filled 2018-07-17 (×2): qty 2

## 2018-07-17 MED ORDER — ASPIRIN EC 81 MG PO TBEC
81.0000 mg | DELAYED_RELEASE_TABLET | Freq: Every day | ORAL | Status: DC
  Administered 2018-07-17 – 2018-07-20 (×4): 81 mg via ORAL
  Filled 2018-07-17 (×4): qty 1

## 2018-07-17 MED ORDER — METOCLOPRAMIDE HCL 5 MG/ML IJ SOLN
10.0000 mg | Freq: Four times a day (QID) | INTRAMUSCULAR | Status: DC
  Administered 2018-07-17 – 2018-07-20 (×12): 10 mg via INTRAVENOUS
  Filled 2018-07-17 (×12): qty 2

## 2018-07-17 MED ORDER — INSULIN ASPART 100 UNIT/ML ~~LOC~~ SOLN: 10.0000 [IU] | Freq: Three times a day (TID) | SUBCUTANEOUS | Status: DC

## 2018-07-17 MED ORDER — METOCLOPRAMIDE HCL 5 MG/ML IJ SOLN
10.0000 mg | Freq: Once | INTRAMUSCULAR | Status: AC
  Administered 2018-07-17: 04:00:00 10 mg via INTRAVENOUS
  Filled 2018-07-17: qty 2

## 2018-07-17 MED ORDER — ONDANSETRON HCL 4 MG/2ML IJ SOLN
4.0000 mg | Freq: Once | INTRAMUSCULAR | Status: AC
  Administered 2018-07-17: 05:00:00 4 mg via INTRAVENOUS
  Filled 2018-07-17: qty 2

## 2018-07-17 MED ORDER — SODIUM CHLORIDE 0.9 % IV SOLN
INTRAVENOUS | Status: AC
  Administered 2018-07-17: 17:00:00 via INTRAVENOUS

## 2018-07-17 MED ORDER — METOPROLOL SUCCINATE ER 25 MG PO TB24
25.0000 mg | ORAL_TABLET | Freq: Every day | ORAL | Status: DC
  Administered 2018-07-18 – 2018-07-20 (×3): 25 mg via ORAL
  Filled 2018-07-17 (×4): qty 1

## 2018-07-17 MED ORDER — FENTANYL CITRATE (PF) 100 MCG/2ML IJ SOLN
100.0000 ug | Freq: Once | INTRAMUSCULAR | Status: AC
  Administered 2018-07-17: 05:00:00 100 ug via INTRAVENOUS
  Filled 2018-07-17: qty 2

## 2018-07-17 MED ORDER — SODIUM CHLORIDE 0.9 % IV SOLN: INTRAVENOUS | Status: DC

## 2018-07-17 MED ORDER — METOCLOPRAMIDE HCL 5 MG/ML IJ SOLN: 10.0000 mg | Freq: Four times a day (QID) | INTRAMUSCULAR | Status: DC

## 2018-07-17 MED ORDER — DIPHENHYDRAMINE HCL 50 MG/ML IJ SOLN
25.0000 mg | Freq: Once | INTRAMUSCULAR | Status: AC
  Administered 2018-07-17: 04:00:00 25 mg via INTRAVENOUS
  Filled 2018-07-17: qty 1

## 2018-07-17 MED ORDER — ENOXAPARIN SODIUM 40 MG/0.4ML ~~LOC~~ SOLN
40.0000 mg | SUBCUTANEOUS | Status: DC
  Administered 2018-07-17 – 2018-07-18 (×2): 40 mg via SUBCUTANEOUS
  Filled 2018-07-17 (×2): qty 0.4

## 2018-07-17 MED ORDER — DULOXETINE HCL 30 MG PO CPEP
30.0000 mg | ORAL_CAPSULE | Freq: Two times a day (BID) | ORAL | Status: DC
  Administered 2018-07-17 – 2018-07-20 (×6): 30 mg via ORAL
  Filled 2018-07-17 (×6): qty 1

## 2018-07-17 MED ORDER — ATORVASTATIN CALCIUM 10 MG PO TABS
10.0000 mg | ORAL_TABLET | Freq: Every day | ORAL | Status: DC
  Administered 2018-07-17 – 2018-07-20 (×4): 10 mg via ORAL
  Filled 2018-07-17 (×4): qty 1

## 2018-07-17 MED ORDER — ALPRAZOLAM 0.25 MG PO TABS
0.2500 mg | ORAL_TABLET | Freq: Two times a day (BID) | ORAL | Status: DC | PRN
  Administered 2018-07-19: 22:00:00 0.25 mg via ORAL
  Filled 2018-07-17 (×2): qty 1

## 2018-07-17 NOTE — H&P (Addendum)
History and Physical    Deborah [name] ZOX:[id number] DOB: 20-May-1965 DOA: 07/17/2018  PCP: Patient, No Pcp Per Patient coming from: [name]  Chief Complaint: Vomiting, chest pain  HPI: Deborah [name] is a 53 y.o. female with medical history significant of type 2 diabetes, gastroparesis, GERD, chronic pain, gout, hypertension, hyperlipidemia, CVA, and conditions listed below presenting to the hospital via EMS from her nursing home complaining of vomiting and chest pain. Patient reports 1 day history of nausea and vomiting.  States she has not been able to tolerate p.o. intake.  She is also having diffuse 7 out of 10 intensity abdominal pain which she describes as someone punching her in her abdomen.  States her roommate was sick a week ago and vomiting.  Denies any diarrhea.  She has a history of diabetic gastroparesis and reports compliance with home Reglan.  States yesterday evening soon after she vomited she experienced left-sided pressure-like chest pain which radiated to her left arm.  She checked her blood sugar at that time and it was in the 300 range.  The chest pain lasted for several hours and was relieved after she received fentanyl in the ED.  She has also been feeling a little short of breath for the past 1 day.  Denies any cough, fevers, or chills.  States she is a nursing home resident and has not gone anywhere outside.  She does not think she has been exposed to any individual with COVID-19.  Denies dysuria but does report urinary frequency and urgency for the past 1 day.  States she takes Lantus 40 units at bedtime and Humalog 10 units 3 times a day with meals along with sliding scale insulin.  ED Course: Afebrile.  Tachycardic.  Not tachypneic or hypoxic.  White count 15.9 with left shift.  Hemoglobin 10.0, above baseline.  Platelet count 343.  Sodium 135, potassium 5.4.  Blood glucose 385.  BUN 25.  Creatinine 1.5, baseline 1.2.  Lipase and LFTs normal.  Troponin  negative and EKG not suggestive of ACS.  COVID-19 rapid test negative.  UA not suggestive of infection.  Chest x-ray showing asymmetric mild opacity at the right lung base, nonspecific.  Received Benadryl, fentanyl, Reglan, Zofran, and 1 L IV fluid bolus in the ED.  Review of Systems: As per HPI otherwise 10 point review of systems negative.  Past Medical History:  Diagnosis Date   Acute back pain with sciatica, left    Acute back pain with sciatica, right    Anemia, unspecified    Chest pain 12/2015   Chronic pain    Diabetes mellitus    Esophageal reflux    Fibromyalgia    Gastric ulcer    Gastroparesis    Gout    Hyperlipidemia    Hypertension    Lumbosacral stenosis    Obesity    Pneumonia    [name]    Stroke [facility]) 02/2011   Vitamin B12 deficiency anemia     Past Surgical History:  Procedure Laterality Date   CATARACT EXTRACTION  01/2014   CHOLECYSTECTOMY       reports that she has never smoked. She has never used smokeless tobacco. She reports that she does not drink alcohol or use drugs.  Allergies  Allergen Reactions   Contrast Media [Iodinated Diagnostic Agents] Anaphylaxis    Cardiac arrest   Diazepam [name]   Isovue [Iopamidol] Anaphylaxis    Patient had seizure like activity and then code  post 100 cc of isovue 300   Lisinopril Anaphylaxis    Tongue and mouth swelling   Penicillins Palpitations    Has patient had a PCN reaction causing immediate rash, facial/tongue/throat swelling, SOB or lightheadedness with hypotension: Yes, heart races Has patient had a PCN reaction causing severe rash involving mucus membranes or skin necrosis: No Has patient had a PCN reaction that required hospitalization: Yes  Has patient had a PCN reaction occurring within the last 10 years: No    Acetaminophen Nausea Only and Other (See Comments)    Irritates stomach ulcer  Abdominal pain   Tolmetin Nausea Only    Other  reaction(s): Other (See Comments) ULCER   Aspirin Other (See Comments)    Irritates stomach ulcer    Food Hives and Swelling    Carrots, ketchup    Nsaids Other (See Comments)    ULCER   Tramadol Nausea And Vomiting    Family History  Problem Relation Age of Onset   Diabetes Mother    Diabetes Father    Heart disease Father    Diabetes Sister    Congestive Heart Failure Sister 9   Diabetes Brother     Prior to Admission medications   Medication Sig Start Date End Date Taking? Authorizing Provider  acetaminophen (TYLENOL) 325 MG tablet Take 2 tablets (650 mg total) by mouth every 4 (four) hours as needed for fever, headache or mild pain. 12/02/17   Hosie Poisson, MD  allopurinol (ZYLOPRIM) 100 MG tablet Take 1 tablet (100 mg total) by mouth daily. 09/23/15   Debbe Odea, MD  ALPRAZolam Duanne Moron) 0.25 MG tablet Take 0.25 mg by mouth 2 (two) times daily as needed for anxiety.     [provider]  amLODipine (NORVASC) 10 MG tablet Take 1 tablet (10 mg total) by mouth daily. 08/02/17   Rai, Vernelle Emerald, MD  aspirin (ECOTRIN LOW STRENGTH) 81 MG EC tablet Take 81 mg by mouth daily. Swallow whole.    [provider]  atorvastatin (LIPITOR) 10 MG tablet Take 10 mg by mouth daily.    [provider]  cefdinir (OMNICEF) 300 MG capsule Take 1 capsule (300 mg total) by mouth every 12 (twelve) hours. 12/02/17   Hosie Poisson, MD  cetirizine (ZYRTEC) 10 MG tablet Take 10 mg by mouth daily.    [provider]  Cyanocobalamin 1000 MCG/ML KIT Inject 1,000 mcg as directed every 30 (thirty) days.    [provider]  DULoxetine (CYMBALTA) 30 MG capsule Take 1 capsule (30 mg total) by mouth 2 (two) times daily. 09/23/15   Debbe Odea, MD  guaiFENesin (ROBITUSSIN) 100 MG/5ML SOLN Take 5 mLs (100 mg total) by mouth every 4 (four) hours as needed for cough or to loosen phlegm. 12/02/17   Hosie Poisson, MD  insulin glargine (LANTUS) 100 UNIT/ML injection  Inject 40 Units into the skin at bedtime. 04/01/17   [provider]  insulin lispro (HUMALOG) 100 UNIT/ML injection Inject 10 Units into the skin 3 (three) times daily. 04/01/17   [provider]  Melatonin 3 MG TABS Take 6 mg by mouth at bedtime.    [provider]  methocarbamol (ROBAXIN) 500 MG tablet Take 500 mg by mouth 2 (two) times daily.    [provider]  metoCLOPramide (REGLAN) 10 MG tablet Take 1 tablet (10 mg total) by mouth 3 (three) times daily before meals. Patient taking differently: Take 10 mg by mouth 4 (four) times daily -  before meals  and at bedtime.  08/02/17   Rai, Vernelle Emerald, MD  metoprolol succinate (TOPROL-XL) 25 MG 24 hr tablet Take 1 tablet (25 mg total) by mouth daily. 08/02/17   Rai, Vernelle Emerald, MD  metroNIDAZOLE (FLAGYL) 500 MG tablet Take 1 tablet (500 mg total) by mouth every 8 (eight) hours. 12/02/17   Hosie Poisson, MD  Multiple Vitamin (MULTIVITAMIN WITH MINERALS) TABS tablet Take 1 tablet by mouth daily.    [provider]  nitroGLYCERIN (NITROSTAT) 0.4 MG SL tablet Place 0.4 mg under the tongue every 5 (five) minutes as needed for chest pain.    [provider]  ondansetron (ZOFRAN-ODT) 4 MG disintegrating tablet Take 4 mg by mouth every 6 (six) hours as needed for nausea.    [provider]  pantoprazole (PROTONIX) 40 MG tablet Take 1 tablet (40 mg total) by mouth 2 (two) times daily. Patient taking differently: Take 40 mg by mouth daily.  08/02/17   Rai, Ripudeep K, MD  potassium chloride (K-DUR) 10 MEQ tablet Take 2 tablets (20 mEq total) by mouth daily for 7 days. Patient not taking: Reported on 11/28/2017 08/02/17 08/09/17  [name], MD    Physical Exam: Vitals:   07/17/18 0600 07/17/18 0615 07/17/18 0630 07/17/18 0645  BP: (!) 123/99 (!) 125/34 (!) 115/45 132/80  Pulse: (!) 108 (!) 108 (!) 105 (!) 107  Resp: 18 20 (!) 26 (!) 25  Temp:      TempSrc:      SpO2: 96% 96% 95% 95%  Weight:       Height:        Physical Exam  Constitutional: She is oriented to person, place, and time. She appears well-developed and well-nourished. No distress.  Morbidly obese  HENT:  Head: Normocephalic.  Dry mucous membranes  Eyes: Right eye exhibits no discharge. Left eye exhibits no discharge.  Neck: Neck supple.  Cardiovascular: Regular rhythm and intact distal pulses.  Slightly tachycardic  Pulmonary/Chest: Effort normal and breath sounds normal. No respiratory distress. She has no wheezes. She has no rales.  Abdominal: Soft. Bowel sounds are normal. She exhibits no distension. There is abdominal tenderness. There is no rebound and no guarding.  Abdominal exam limited secondary to large body habitus Generalized tenderness to palpation  Musculoskeletal:        General: No edema.  Neurological: She is alert and oriented to person, place, and time.  Skin: Skin is warm and dry. She is not diaphoretic.     Labs on Admission: I have personally reviewed following labs and imaging studies  CBC: Recent Labs  Lab 07/17/18 0320  WBC 15.9*  NEUTROABS 13.6*  HGB 10.0*  HCT 31.5*  MCV 89.7  PLT 035   Basic Metabolic Panel: Recent Labs  Lab 07/17/18 0320  NA 135  K 5.4*  CL 100  CO2 21*  GLUCOSE 385*  BUN 25*  CREATININE 1.58*  CALCIUM 9.8   GFR: Estimated Creatinine Clearance: 63.4 mL/min (A) (by C-G formula based on SCr of 1.58 mg/dL (H)). Liver Function Tests: Recent Labs  Lab 07/17/18 0320  AST 34  ALT 29  ALKPHOS 99  BILITOT 0.7  PROT 7.9  ALBUMIN 3.6   Recent Labs  Lab 07/17/18 0320  LIPASE 27   No results for input(s): AMMONIA in the last 168 hours. Coagulation Profile: No results for input(s): INR, PROTIME in the last 168 hours. Cardiac Enzymes: Recent Labs  Lab 07/17/18 0320  TROPONINI <0.03   BNP (last 3  results) No results for input(s): PROBNP in the last 8760 hours. HbA1C: No results for input(s): HGBA1C in the last 72 hours. CBG: Recent  Labs  Lab 07/17/18 0308  GLUCAP 347*   Lipid Profile: No results for input(s): CHOL, HDL, LDLCALC, TRIG, CHOLHDL, LDLDIRECT in the last 72 hours. Thyroid Function Tests: No results for input(s): TSH, T4TOTAL, FREET4, T3FREE, THYROIDAB in the last 72 hours. Anemia Panel: No results for input(s): VITAMINB12, FOLATE, FERRITIN, TIBC, IRON, RETICCTPCT in the last 72 hours. Urine analysis:    Component Value Date/Time   COLORURINE YELLOW 07/17/2018 0537   APPEARANCEUR HAZY (A) 07/17/2018 0537   LABSPEC 1.021 07/17/2018 0537   PHURINE 5.0 07/17/2018 0537   GLUCOSEU >=500 (A) 07/17/2018 0537   HGBUR LARGE (A) 07/17/2018 0537   BILIRUBINUR NEGATIVE 07/17/2018 0537   KETONESUR NEGATIVE 07/17/2018 0537   PROTEINUR >=300 (A) 07/17/2018 0537   UROBILINOGEN 0.2 10/02/2013 2108   NITRITE NEGATIVE 07/17/2018 0537   LEUKOCYTESUR NEGATIVE 07/17/2018 0537    Radiological Exams on Admission: Dg Chest Port 1 View  Result Date: 07/17/2018 CLINICAL DATA:  53 year old female with chest pain and shortness of breath. EXAM: PORTABLE CHEST 1 VIEW COMPARISON:  Chest CTA and radiographs 07/29/2017 and earlier. FINDINGS: Portable AP semi upright view at 0330 hours. Mildly lower lung volumes. Mediastinal contours remain normal. Visualized tracheal air column is within normal limits. No pneumothorax, pulmonary edema or pleural effusion. Mild asymmetric increased patchy right lung base opacity. Elsewhere the lungs appear clear. Stable visualized osseous structures. IMPRESSION: Asymmetric mild opacity at the right lung base opacity is nonspecific, but might simply reflect atelectasis. No pleural effusion or other acute cardiopulmonary abnormality identified. Electronically Signed   By: Genevie Ann M.D.   On: 07/17/2018 04:20    EKG: Independently reviewed.  Sinus tachycardia (heart rate 115).  Assessment/Plan Principal Problem:   Nausea and vomiting Active Problems:   Chest pain   Leukocytosis   Abdominal pain    Hyperkalemia   Nausea, vomiting, abdominal pain Differentials include diabetic gastroparesis and viral gastroenteritis.  Lipase and LFTs normal.  Abdomen diffusely tender, exam limited due to patient's large body habitus.  White count 15.9 with left shift. -CT abdomen pelvis without contrast to check for possible intra-abdominal source of infection -IV fluid hydration -IV Reglan 10 mg every 6 hours -IV Zofran PRN -IV morphine 1 mg every 3 hours as needed for abdominal pain -Continue to monitor electrolytes  Leukocytosis White count 15.9 with left shift.  UA not suggestive of infection. Chest x-ray showing asymmetric mild opacity at the right lung base, nonspecific.  Pneumonia less likely given no cough, tachypnea, or hypoxia.  No signs of respiratory distress.  Does appear dry on exam and hemoconcentration could also be playing a role. -CT abdomen pelvis as mentioned above to rule out intra-abdominal source of infection given nausea, vomiting, and abdominal pain. -Check procalcitonin level -Continue to monitor CBC  Chest pain Patient reported left-sided pressure-like chest pain which radiated to her left arm.  Chest pain started after she vomited.  It lasted several hours and resolved after she received fentanyl in the ED.  Currently chest pain-free.  Troponin negative and EKG not suggestive of ACS.  Cardiac cath done at [facility] in July 2019 revealed normal coronary arteries. -Cardiac monitoring -Continue to trend troponin  Mild hyperkalemia Potassium 5.4.  EKG without acute changes.  Potassium supplement listed in home medications was prescribed in September 2019, unclear whether patient is still taking it. -Cardiac monitoring -Calcium  gluconate 1 g -NovoLog 10 units  Hyperglycemia, type 2 diabetes Blood glucose 385 on admission.  No signs of DKA. -NovoLog 10 units once at this time -Continue Lantus 40 units at bedtime -Continue Humalog 10 units 3 times a day with meals -Sliding  scale insulin moderate and CBG checks -Check A1c  Mild AKI on CKD 3 BUN 25.  Creatinine 1.5, baseline 1.2.  Likely prerenal from dehydration. -IV fluid hydration -Continue to monitor BMP -Monitor urine output  GERD -IV Protonix  Unable to safely order remainder of home medications at this time as pharmacy medication reconciliation is pending.  DVT prophylaxis: Lovenox Code Status: Full code Family Communication: No family available. Disposition Plan: Anticipate discharge in 1 to 2 days. Consults called: None Admission status: Observation, telemetry  This chart was dictated using voice recognition software.  Despite best efforts to proofread, errors can occur which can change the documentation meaning.  Shela Leff MD [facility] Pager [phone]  If 7PM-7AM, please contact night-coverage [email or web address] Password [email or web address]  07/17/2018, 7:03 AM

## 2018-07-17 NOTE — ED Notes (Signed)
ED TO INPATIENT HANDOFF REPORT  ED Nurse Name and Phone #:  [phone]  S Name/Age/Gender Deborah [name] 53 y.o. female Room/Bed: 020C/020C  Code Status   Code Status: Prior  Home/SNF/Other Skilled nursing facility Patient oriented to: self, place, time and situation Is this baseline? Yes   Triage Complete: Triage complete  Chief Complaint chest pain  Triage Note Pt BIB EMS from [facility] c/o chest pain. Pt. States "this feels like a flare up of gastroparesis". Pt. States pain 10 on scale 0-10 described punching, onset yesterday, nausea emesis "all day" and radiation to the left arm. pt. States taking Reglan without improvement.   EMS VS BP 143/100 RR 18 SpO2 98 RA HR 110-115   Allergies Allergies  Allergen Reactions  . Contrast Media [Iodinated Diagnostic Agents] Anaphylaxis    Cardiac arrest  . Diazepam [name]  . Isovue [Iopamidol] Anaphylaxis    Patient had seizure like activity and then code post 100 cc of isovue 300  . Lisinopril Anaphylaxis    Tongue and mouth swelling  . Penicillins Palpitations    Has patient had a PCN reaction causing immediate rash, facial/tongue/throat swelling, SOB or lightheadedness with hypotension: Yes, heart races Has patient had a PCN reaction causing severe rash involving mucus membranes or skin necrosis: No Has patient had a PCN reaction that required hospitalization: Yes  Has patient had a PCN reaction occurring within the last 10 years: No   . Acetaminophen Nausea Only and Other (See Comments)    Irritates stomach ulcer  Abdominal pain  . Tolmetin Nausea Only    Other reaction(s): Other (See Comments) ULCER  . Aspirin Other (See Comments)    Irritates stomach ulcer   . Food Hives and Swelling    Carrots, ketchup   . Nsaids Other (See Comments)    ULCER  . Tramadol Nausea And Vomiting    Level of Care/Admitting Diagnosis ED Disposition    ED Disposition Condition Comment   Admit  Hospital Area:  [place] [[id number]]  Level of Care: Telemetry Cardiac [103]  I expect the patient will be discharged within 24 hours: Yes  LOW acuity---Tx typically complete <24 hrs---ACUTE conditions typically can be evaluated <24 hours---LABS likely to return to acceptable levels <24 hours---IS near functional baseline---EXPECTED to return to current living arrangement---NOT newly hypoxic: Meets criteria for 5C-Observation unit  Covid Evaluation: Person Under Investigation (PUI)  Isolation Risk Level: Low Risk/Droplet (Less than 4L [place] supplementation)  Diagnosis: Vomiting [[id number]]  Admitting Physician: Shela Leff [[id number]]  Attending Physician: Shela Leff [[id number]]  PT Class (Do Not Modify): Observation [104]  PT Acc Code (Do Not Modify): Observation [10022]       B Medical/Surgery History Past Medical History:  Diagnosis Date  . Acute back pain with sciatica, left   . Acute back pain with sciatica, right   . Anemia, unspecified   . Chest pain 12/2015  . Chronic pain   . Diabetes mellitus   . Esophageal reflux   . Fibromyalgia   . Gastric ulcer   . Gastroparesis   . Gout   . Hyperlipidemia   . Hypertension   . Lumbosacral stenosis   . Obesity   . Pneumonia   . [name]   . Stroke ([place]) 02/2011  . Vitamin B12 deficiency anemia    Past Surgical History:  Procedure Laterality Date  . CATARACT EXTRACTION  01/2014  . CHOLECYSTECTOMY       A IV Location/Drains/Wounds  Patient Lines/Drains/Airways Status   Active Line/Drains/Airways    Name:   Placement date:   Placement time:   Site:   Days:   Peripheral IV 07/17/18 Right Antecubital   07/17/18    0342    Antecubital   less than 1   Incision (Closed) 06/18/16 Leg Right;Upper   06/18/16    0022     759          Intake/Output Last 24 hours No intake or output data in the 24 hours ending 07/17/18 6389  Labs/Imaging Results for orders placed or performed during the hospital encounter of  07/17/18 (from the past 48 hour(s))  CBG monitoring, ED     Status: Abnormal   Collection Time: 07/17/18  3:08 AM  Result Value Ref Range   Glucose-Capillary 347 (H) 70 - 99 mg/dL  CBC with Differential/Platelet     Status: Abnormal   Collection Time: 07/17/18  3:20 AM  Result Value Ref Range   WBC 15.9 (H) 4.0 - 10.5 K/uL   RBC 3.51 (L) 3.87 - 5.11 MIL/uL   Hemoglobin 10.0 (L) 12.0 - 15.0 g/dL   HCT 31.5 (L) 36.0 - 46.0 %   MCV 89.7 80.0 - 100.0 fL   MCH 28.5 26.0 - 34.0 pg   MCHC 31.7 30.0 - 36.0 g/dL   RDW 14.4 11.5 - 15.5 %   Platelets 343 150 - 400 K/uL   nRBC 0.0 0.0 - 0.2 %   Neutrophils Relative % 85 %   Neutro Abs 13.6 (H) 1.7 - 7.7 K/uL   Lymphocytes Relative 11 %   Lymphs Abs 1.7 0.7 - 4.0 K/uL   Monocytes Relative 3 %   Monocytes Absolute 0.5 0.1 - 1.0 K/uL   Eosinophils Relative 0 %   Eosinophils Absolute 0.0 0.0 - 0.5 K/uL   Basophils Relative 0 %   Basophils Absolute 0.0 0.0 - 0.1 K/uL   Immature Granulocytes 1 %   Abs Immature Granulocytes 0.11 (H) 0.00 - 0.07 K/uL    Comment: Performed at [facility], [address] [place], [place] [address]  Comprehensive metabolic panel     Status: Abnormal   Collection Time: 07/17/18  3:20 AM  Result Value Ref Range   Sodium 135 135 - 145 mmol/L   Potassium 5.4 (H) 3.5 - 5.1 mmol/L   Chloride 100 98 - 111 mmol/L   CO2 21 (L) 22 - 32 mmol/L   Glucose, Bld 385 (H) 70 - 99 mg/dL   BUN 25 (H) 6 - 20 mg/dL   Creatinine, Ser 1.58 (H) 0.44 - 1.00 mg/dL   Calcium 9.8 8.9 - 10.3 mg/dL   Total Protein 7.9 6.5 - 8.1 g/dL   Albumin 3.6 3.5 - 5.0 g/dL   AST 34 15 - 41 U/L   ALT 29 0 - 44 U/L   Alkaline Phosphatase 99 38 - 126 U/L   Total Bilirubin 0.7 0.3 - 1.2 mg/dL   GFR calc non Af Amer 37 (L) >60 mL/min   GFR calc Af Amer 43 (L) >60 mL/min   Anion gap 14 5 - 15    Comment: Performed at [facility] [address], [place], [place] [address]  Lipase, blood     Status: None   Collection Time: 07/17/18   3:20 AM  Result Value Ref Range   Lipase 27 11 - 51 U/L    Comment: Performed at [facility] [address], [place], [place]  [address]  Troponin I - ONCE - STAT     Status: None   Collection Time: 07/17/18  3:20 AM  Result Value Ref Range   Troponin I <0.03 <0.03 ng/mL    Comment: Performed at [facility], [place] [address] [place], [place] [address]  SARS Coronavirus 2 (CEPHEID - Performed in [place] hospital lab), Hosp Order     Status: None   Collection Time: 07/17/18  5:15 AM  Result Value Ref Range   SARS Coronavirus 2 NEGATIVE NEGATIVE    Comment: (NOTE) If result is NEGATIVE SARS-CoV-2 target nucleic acids are NOT DETECTED. The SARS-CoV-2 RNA is generally detectable in upper and lower  respiratory specimens during the acute phase of infection. The lowest  concentration of SARS-CoV-2 viral copies this assay can detect is 250  copies / mL. A negative result does not preclude SARS-CoV-2 infection  and should not be used as the sole basis for treatment or other  patient management decisions.  A negative result may occur with  improper specimen collection / handling, submission of specimen other  than nasopharyngeal swab, presence of viral mutation(s) within the  areas targeted by this assay, and inadequate number of viral copies  (<250 copies / mL). A negative result must be combined with clinical  observations, patient history, and epidemiological information. If result is POSITIVE SARS-CoV-2 target nucleic acids are DETECTED. The SARS-CoV-2 RNA is generally detectable in upper and lower  respiratory specimens dur ing the acute phase of infection.  Positive  results are indicative of active infection with SARS-CoV-2.  Clinical  correlation with patient history and other diagnostic information is  necessary to determine patient infection status.  Positive results do  not rule out bacterial infection or co-infection with other viruses. If result is PRESUMPTIVE  POSTIVE SARS-CoV-2 nucleic acids MAY BE PRESENT.   A presumptive positive result was obtained on the submitted specimen  and confirmed on repeat testing.  While 2019 novel coronavirus  (SARS-CoV-2) nucleic acids may be present in the submitted sample  additional confirmatory testing may be necessary for epidemiological  and / or clinical management purposes  to differentiate between  SARS-CoV-2 and other Sarbecovirus currently known to infect humans.  If clinically indicated additional testing with an alternate test  methodology [phone]) is advised. The SARS-CoV-2 RNA is generally  detectable in upper and lower respiratory sp ecimens during the acute  phase of infection. The expected result is Negative. Fact Sheet for Patients:  StrictlyIdeas.no Fact Sheet for Healthcare Providers: BankingDealers.co.za This test is not yet approved or cleared by the [place] FDA and has been authorized for detection and/or diagnosis of SARS-CoV-2 by FDA under an Emergency Use Authorization (EUA).  This EUA will remain in effect (meaning this test can be used) for the duration of the COVID-19 declaration under Section 564(b)(1) of the Act, 21 U.S.C. section 360bbb-3(b)(1), unless the authorization is terminated or revoked sooner. Performed at [facility],  [address] [place], [place] [address]   Urinalysis, Routine w reflex microscopic     Status: Abnormal   Collection Time: 07/17/18  5:37 AM  Result Value Ref Range   Color, Urine YELLOW YELLOW   APPearance HAZY (A) CLEAR   Specific Gravity, Urine 1.021 1.005 - 1.030   pH 5.0 5.0 - 8.0   Glucose, UA >=500 (A) NEGATIVE mg/dL   Hgb urine dipstick LARGE (A) NEGATIVE   Bilirubin Urine NEGATIVE NEGATIVE   Ketones, ur NEGATIVE NEGATIVE mg/dL  Protein, ur >=300 (A) NEGATIVE mg/dL   Nitrite NEGATIVE NEGATIVE   Leukocytes,Ua NEGATIVE NEGATIVE   RBC / HPF 11-20 0 - 5 RBC/hpf   WBC, UA 0-5  0 - 5 WBC/hpf   Bacteria, UA NONE SEEN NONE SEEN   Squamous Epithelial / LPF 6-10 0 - 5    Comment: Performed at [facility], [place] [address], [place], [place] [address]   Dg Chest Port 1 View  Result Date: 07/17/2018 CLINICAL DATA:  53 year old female with chest pain and shortness of breath. EXAM: PORTABLE CHEST 1 VIEW COMPARISON:  Chest CTA and radiographs 07/29/2017 and earlier. FINDINGS: Portable AP semi upright view at 0330 hours. Mildly lower lung volumes. Mediastinal contours remain normal. Visualized tracheal air column is within normal limits. No pneumothorax, pulmonary edema or pleural effusion. Mild asymmetric increased patchy right lung base opacity. Elsewhere the lungs appear clear. Stable visualized osseous structures. IMPRESSION: Asymmetric mild opacity at the right lung base opacity is nonspecific, but might simply reflect atelectasis. No pleural effusion or other acute cardiopulmonary abnormality identified. Electronically Signed   By: Genevie Ann M.D.   On: 07/17/2018 04:20    Pending Labs Unresulted Labs (From admission, onward)   None      Vitals/Pain Today's Vitals   07/17/18 0515 07/17/18 0530 07/17/18 0557 07/17/18 0600  BP: 139/78 118/83  (!) 123/99  Pulse: (!) 110 (!) 118  (!) 108  Resp: (!) 23 17  18   Temp:      TempSrc:      SpO2: 93% 96%  96%  Weight:      Height:      PainSc:   8      Isolation Precautions No active isolations  Medications Medications  metoCLOPramide (REGLAN) injection 10 mg (10 mg Intravenous Given 07/17/18 0345)  diphenhydrAMINE (BENADRYL) injection 25 mg (25 mg Intravenous Given 07/17/18 0345)  fentaNYL (SUBLIMAZE) injection 100 mcg (100 mcg Intravenous Given 07/17/18 0437)  ondansetron (ZOFRAN) injection 4 mg (4 mg Intravenous Given 07/17/18 0437)  sodium chloride 0.9 % bolus 1,000 mL (0 mLs Intravenous Stopped 07/17/18 0532)    Mobility non-ambulatory Low fall risk   Focused Assessments Cardiac Assessment  Handoff:  Cardiac Rhythm: Sinus tachycardia Lab Results  Component Value Date   CKTOTAL 177 03/24/2010   CKMB 3.0 03/24/2010   TROPONINI <0.03 07/17/2018   Lab Results  Component Value Date   DDIMER 0.66 (H) 07/29/2017   Does the Patient currently have chest pain? No     R Recommendations: See Admitting Provider Note  Report given to:   Additional Notes:

## 2018-07-17 NOTE — ED Triage Notes (Signed)
Pt BIB EMS from [facility] c/o chest pain. Pt. States "this feels like a flare up of gastroparesis". Pt. States pain 10 on scale 0-10 described punching, onset yesterday, nausea emesis "all day" and radiation to the left arm. pt. States taking Reglan without improvement.   EMS VS BP 143/100 RR 18 SpO2 98 RA HR 110-115

## 2018-07-17 NOTE — ED Notes (Signed)
Patient transported to CT 

## 2018-07-17 NOTE — Progress Notes (Signed)
Inpatient Diabetes Program Recommendations  AACE/ADA: New Consensus Statement on Inpatient Glycemic Control (2015)  Target Ranges:  Prepandial:   less than 140 mg/dL      Peak postprandial:   less than 180 mg/dL (1-2 hours)      Critically ill patients:  140 - 180 mg/dL   Lab Results  Component Value Date   GLUCAP 347 (H) 07/17/2018   HGBA1C 8.5 (H) 07/30/2017    Review of Glycemic Control Results for MANESSA, BULEY (MRN [id number]) as of 07/17/2018 09:08  Ref. Range 07/17/2018 03:08  Glucose-Capillary Latest Ref Range: 70 - 99 mg/dL 347 (H)   Diabetes history: DM 2, Gastroparesis Outpatient Diabetes medications: Lantus 40 units, Humalog 10 units tid with meals Current orders for Inpatient glycemic control: Lantus 40 units , Novolog 0-15 units tid  Patient from [facility] Patient ordered Clear liquid diet  Admitting glucose in the 300's  Renal function elevated consistent with patients medical history.  Inpatient Diabetes Program Recommendations:    Previous admissions patient was on similar regimen that is ordered currently and glucose trends came down into the 100 range.   Agree with insulin regimen. Will watch glucose trends for today.   Thanks,  Tama Headings RN, MSN, BC-ADM Inpatient Diabetes Coordinator Team Pager [phone] (8a-5p)

## 2018-07-17 NOTE — ED Provider Notes (Signed)
[facility] EMERGENCY DEPARTMENT Provider Note   [name]: [id number] Arrival date & time: 07/17/18  0257    History   Chief Complaint Chief Complaint - chest and abdominal pain  HPI Deborah [name] is a 53 y.o. female.     The history is provided by the patient.  Abdominal Pain  Pain location:  Generalized Pain severity:  Severe Onset quality:  Gradual Duration:  1 day Timing:  Constant Progression:  Worsening Chronicity:  New Relieved by:  Nothing Worsened by:  Movement and palpation Ineffective treatments: Reglan. Associated symptoms: chest pain, nausea and vomiting   Associated symptoms: no fever and no hematemesis   Patient with history of obesity, gastroparesis, hypertension presents for chest/abdominal pain and vomiting.  Patient reports this feels like prior episodes of gastroparesis.  No fevers. She reports home medications have not improved her symptoms  Past Medical History:  Diagnosis Date  . Acute back pain with sciatica, left   . Acute back pain with sciatica, right   . Anemia, unspecified   . Chest pain 12/2015  . Chronic pain   . Diabetes mellitus   . Esophageal reflux   . Fibromyalgia   . Gastric ulcer   . Gastroparesis   . Gout   . Hyperlipidemia   . Hypertension   . Lumbosacral stenosis   . Obesity   . Pneumonia   . [name]   . Stroke ([place]) 02/2011  . Vitamin B12 deficiency anemia     Patient Active Problem List   Diagnosis Date Noted  . Cardiac arrest ([place]) 11/29/2017  . Pelvic mass 11/29/2017  . Leukocytosis 11/29/2017  . Anxiety 11/29/2017  . Allergic reaction to contrast media, initial encounter 11/28/2017  . Palliative care encounter   . Back pain 03/19/2017  . Stroke (cerebrum) ([place]) 03/19/2017  . GERD (gastroesophageal reflux disease) 03/19/2017  . Depression 03/19/2017  . Obesity   . Urinary tract infection 08/16/2016  . Normocytic normochromic anemia 08/16/2016  . Gastroparesis 08/16/2016   . Intractable nausea and vomiting 06/17/2016  . Diabetic gastroparesis ([place]) 06/05/2016  . Gout 06/05/2016  . AKI (acute kidney injury) ([place]) 12/06/2015  . Hypokalemia 09/26/2015  . Hypomagnesemia 09/26/2015  . Nausea and vomiting 08/20/2015  . Gout flare 05/27/2015  . Lower abdominal pain 05/26/2015  . DKA (diabetic ketoacidoses) ([place]) 05/25/2015  . Uncontrolled type 2 diabetes mellitus with diabetic neuropathy, with long-term current use of insulin ([place]) 05/25/2015  . Dyslipidemia associated with type 2 diabetes mellitus ([place]) 05/25/2015  . CKD (chronic kidney disease), stage II 05/25/2015  . Essential hypertension, benign 09/28/2013    Past Surgical History:  Procedure Laterality Date  . CATARACT EXTRACTION  01/2014  . CHOLECYSTECTOMY       OB History   No obstetric history on file.      Home Medications    Prior to Admission medications   Medication Sig Start Date End Date Taking? Authorizing Provider  acetaminophen (TYLENOL) 325 MG tablet Take 2 tablets (650 mg total) by mouth every 4 (four) hours as needed for fever, headache or mild pain. 12/02/17   Hosie Poisson, MD  allopurinol (ZYLOPRIM) 100 MG tablet Take 1 tablet (100 mg total) by mouth daily. 09/23/15   Debbe Odea, MD  ALPRAZolam Duanne Moron) 0.25 MG tablet Take 0.25 mg by mouth 2 (two) times daily as needed for anxiety.     [provider]  amLODipine (NORVASC) 10 MG tablet Take 1 tablet (10 mg total) by mouth daily.  08/02/17   Rai, Vernelle Emerald, MD  aspirin (ECOTRIN LOW STRENGTH) 81 MG EC tablet Take 81 mg by mouth daily. Swallow whole.    [provider]  atorvastatin (LIPITOR) 10 MG tablet Take 10 mg by mouth daily.    [provider]  cefdinir (OMNICEF) 300 MG capsule Take 1 capsule (300 mg total) by mouth every 12 (twelve) hours. 12/02/17   Hosie Poisson, MD  cetirizine (ZYRTEC) 10 MG tablet Take 10 mg by mouth daily.    [provider]  Cyanocobalamin 1000 MCG/ML KIT Inject  1,000 mcg as directed every 30 (thirty) days.    [provider]  DULoxetine (CYMBALTA) 30 MG capsule Take 1 capsule (30 mg total) by mouth 2 (two) times daily. 09/23/15   Debbe Odea, MD  guaiFENesin (ROBITUSSIN) 100 MG/5ML SOLN Take 5 mLs (100 mg total) by mouth every 4 (four) hours as needed for cough or to loosen phlegm. 12/02/17   Hosie Poisson, MD  insulin glargine (LANTUS) 100 UNIT/ML injection Inject 40 Units into the skin at bedtime. 04/01/17   [provider]  insulin lispro (HUMALOG) 100 UNIT/ML injection Inject 10 Units into the skin 3 (three) times daily. 04/01/17   [provider]  Melatonin 3 MG TABS Take 6 mg by mouth at bedtime.    [provider]  methocarbamol (ROBAXIN) 500 MG tablet Take 500 mg by mouth 2 (two) times daily.    [provider]  metoCLOPramide (REGLAN) 10 MG tablet Take 1 tablet (10 mg total) by mouth 3 (three) times daily before meals. Patient taking differently: Take 10 mg by mouth 4 (four) times daily -  before meals and at bedtime.  08/02/17   Rai, Vernelle Emerald, MD  metoprolol succinate (TOPROL-XL) 25 MG 24 hr tablet Take 1 tablet (25 mg total) by mouth daily. 08/02/17   Rai, Vernelle Emerald, MD  metroNIDAZOLE (FLAGYL) 500 MG tablet Take 1 tablet (500 mg total) by mouth every 8 (eight) hours. 12/02/17   Hosie Poisson, MD  Multiple Vitamin (MULTIVITAMIN WITH MINERALS) TABS tablet Take 1 tablet by mouth daily.    [provider]  nitroGLYCERIN (NITROSTAT) 0.4 MG SL tablet Place 0.4 mg under the tongue every 5 (five) minutes as needed for chest pain.    [provider]  ondansetron (ZOFRAN-ODT) 4 MG disintegrating tablet Take 4 mg by mouth every 6 (six) hours as needed for nausea.    [provider]  pantoprazole (PROTONIX) 40 MG tablet Take 1 tablet (40 mg total) by mouth 2 (two) times daily. Patient taking differently: Take 40 mg by mouth daily.  08/02/17   Rai, Ripudeep K, MD  potassium chloride (K-DUR)  10 MEQ tablet Take 2 tablets (20 mEq total) by mouth daily for 7 days. Patient not taking: Reported on 11/28/2017 08/02/17 08/09/17  [name], MD    Family History Family History  Problem Relation Age of Onset  . Diabetes Mother   . Diabetes Father   . Heart disease Father   . Diabetes Sister   . Congestive Heart Failure Sister 34  . Diabetes Brother     Social History Social History   Tobacco Use  . Smoking status: Never Smoker  . Smokeless tobacco: Never Used  Substance Use Topics  . Alcohol use: No  . Drug use: No     Allergies   Contrast media [iodinated diagnostic agents]; Diazepam; Isovue [iopamidol]; Lisinopril; Penicillins; Acetaminophen; Tolmetin; Aspirin; Food; Nsaids; and Tramadol   Review of Systems  Review of Systems  Constitutional: Negative for fever.  Cardiovascular: Positive for chest pain.  Gastrointestinal: Positive for abdominal pain, nausea and vomiting. Negative for blood in stool and hematemesis.  All other systems reviewed and are negative.    Physical Exam Updated Vital Signs BP (!) 143/82   Pulse (!) 114   Temp 98 F (36.7 C) (Oral)   Resp 15   Ht 1.676 m (_0 )   Wt (!) 157.9 kg   LMP 10/10/2012   SpO2 98%   BMI 56.17 kg/m   Physical Exam CONSTITUTIONAL: ill appearing, uncomfortable HEAD: Normocephalic/atraumatic EYES: EOMI/PERRL, no scleral icterus ENMT: Mucous membranes moist NECK: supple no meningeal signs SPINE/BACK:entire spine nontender CV: S1/S2 noted, no murmurs/rubs/gallops noted, tachycardic LUNGS: Lungs are clear to auscultation bilaterally, no apparent distress ABDOMEN: soft, moderate diffuse tenderness, no rebound or guarding, bowel sounds noted throughout abdomen GU:no cva tenderness NEURO: Pt is awake/alert/appropriate, moves all extremitiesx4.  No facial droop.   EXTREMITIES: pulses normal/equal, full ROM SKIN: warm, color normal PSYCH: anxious  ED Treatments / Results  Labs (all labs ordered are  listed, but only abnormal results are displayed) Labs Reviewed  CBC WITH DIFFERENTIAL/PLATELET - Abnormal; Notable for the following components:      Result Value   WBC 15.9 (*)    RBC 3.51 (*)    Hemoglobin 10.0 (*)    HCT 31.5 (*)    Neutro Abs 13.6 (*)    Abs Immature Granulocytes 0.11 (*)    All other components within normal limits  COMPREHENSIVE METABOLIC PANEL - Abnormal; Notable for the following components:   Potassium 5.4 (*)    CO2 21 (*)    Glucose, Bld 385 (*)    BUN 25 (*)    Creatinine, Ser 1.58 (*)    GFR calc non Af Amer 37 (*)    GFR calc Af Amer 43 (*)    All other components within normal limits  CBG MONITORING, ED - Abnormal; Notable for the following components:   Glucose-Capillary 347 (*)    All other components within normal limits  SARS CORONAVIRUS 2 (HOSPITAL ORDER, [place] [facility])  LIPASE, BLOOD  TROPONIN I  URINALYSIS, ROUTINE W REFLEX MICROSCOPIC    EKG EKG Interpretation  Date/Time:  Monday Jul 17 2018 03:10:38 EDT Ventricular Rate:  115 PR Interval:    QRS Duration: 85 QT Interval:  323 QTC Calculation: 447 R Axis:   -31 Text Interpretation:  Sinus tachycardia Left axis deviation RSR' in V1 or V2, probably normal variant No significant change since last tracing Confirmed by Ripley Fraise [phone]) on 07/17/2018 3:44:10 AM   Radiology Dg Chest Port 1 View  Result Date: 07/17/2018 CLINICAL DATA:  53 year old female with chest pain and shortness of breath. EXAM: PORTABLE CHEST 1 VIEW COMPARISON:  Chest CTA and radiographs 07/29/2017 and earlier. FINDINGS: Portable AP semi upright view at 0330 hours. Mildly lower lung volumes. Mediastinal contours remain normal. Visualized tracheal air column is within normal limits. No pneumothorax, pulmonary edema or pleural effusion. Mild asymmetric increased patchy right lung base opacity. Elsewhere the lungs appear clear. Stable visualized osseous structures. IMPRESSION: Asymmetric  mild opacity at the right lung base opacity is nonspecific, but might simply reflect atelectasis. No pleural effusion or other acute cardiopulmonary abnormality identified. Electronically Signed   By: Genevie Ann M.D.   On: 07/17/2018 04:20    Procedures Procedures  Medications Ordered in ED Medications  metoCLOPramide (REGLAN) injection 10 mg (10 mg Intravenous  Given 07/17/18 0345)  diphenhydrAMINE (BENADRYL) injection 25 mg (25 mg Intravenous Given 07/17/18 0345)  fentaNYL (SUBLIMAZE) injection 100 mcg (100 mcg Intravenous Given 07/17/18 0437)  ondansetron (ZOFRAN) injection 4 mg (4 mg Intravenous Given 07/17/18 0437)  sodium chloride 0.9 % bolus 1,000 mL (0 mLs Intravenous Stopped 07/17/18 0532)     Initial Impression / Assessment and Plan / ED Course  I have reviewed the triage vital signs and the nursing notes.  Pertinent labs & imaging results that were available during my care of the patient were reviewed by me and considered in my medical decision making (see chart for details).        4:00 AM Discussed the case with her nurse at [facility].  She confirms patient reported vomiting and felt like she had gastroparesis.  This started over the past day. Labs/imaging pending at this time 4:31 AM At patient request I updated her mother via phone.   Patient reports continued pain.  Work-up is pending at this time. Of note patient had a cardiac cath in 2019 at [facility].  That cardiac cath revealed normal coronaries 5:42 AM Patient reports pain is improving, but still feels nausea.  She reports this feels similar to prior episodes of gastroparesis.  However she reports it takes several days for her to improve and she would be unable to take any p.o. fluids at this time.  She will require admission for IV hydration and symptom management.  At this point we will defer any CT imaging as she reports similar symptoms previously.  If her pain accelerates she may require imaging.  She does  have IV contrast allergy. 5:50 AM Discussed with Dr. Marlowe Sax for admission.  Final Clinical Impressions(s) / ED Diagnoses   Final diagnoses:  Hyperglycemia  Gastroparesis  Generalized abdominal pain  AKI (acute kidney injury) [facility])    ED Discharge Orders    None       Ripley Fraise, MD 07/17/18 [phone]

## 2018-07-17 NOTE — Progress Notes (Signed)
Patient admitted after midnight with persistent abdominal pain and nausea/vomiting in setting of uncontrolled diabetes concerning for gastroparesis.   Nausea, vomiting, abdominal pain. Continues with nausea and abdominal pain. No emesis. Wants food. Lipase and LFTs normal.   White count 15.9 with left shift. Afebrile and non-toxic appearing.  -CT abdomen pelvis without contrast to check for possible intra-abdominal source of infection pending -IV fluid hydration -clear liquid diet -IV Reglan 10 mg every 6 hours -IV Zofran scheduled -IV morphine 1 mg every 4 hours as needed for abdominal pain  Leukocytosis.  UA not suggestive of infection. Chest x-ray showing asymmetric mild opacity at the right lung base, nonspecific.  Pneumonia less likely given no cough, tachypnea, or hypoxia.  No signs of respiratory distress. Procalcitonin within limits of normal.  Does appear dry on exam and hemoconcentration could also be playing a role. -CT abdomen pelvis as mentioned above to rule out intra-abdominal source of infection given nausea, vomiting, and abdominal pain. -Check procalcitonin level -cbc in am  Abdominal pain/Chest pain.Patient reported left-sided pressure-like chest pain which radiated to her left arm.  Chest pain started after she vomited.  It lasted several hours and resolved after she received fentanyl in the ED. Chest Pain free now. Troponin negative and EKG not suggestive of ACS.  Cardiac cath done at [facility] in July 2019 revealed normal coronary arteries. Complains lower abdominal pain -Cardiac monitoring -follow CT -bowel rest as noted  Mild hyperkalemia. Potassium 5.4.  EKG without acute changes.  Potassium supplement listed in home medications was prescribed in September 2019.. -Cardiac monitoring -Calcium gluconate 1 g -NovoLog 10 units -recheck 2pm -recheck in am  Hyperglycemia, type 2 diabetes. Blood glucose 385 on admission.  No signs of DKA. NovoLog 10 units once given on  admission. HgA1c 9.1. -Continue Lantus 40 units at bedtime -Sliding scale insulin moderate and CBG checks -monitor  Mild AKI on CKD 3 BUN 25.  Creatinine 1.5, baseline 1.2.  Likely prerenal from dehydration. -IV fluid hydration -Continue to monitor BMP -Monitor urine output  GERD -IV Protonix  Karen black, np

## 2018-07-18 ENCOUNTER — Encounter (HOSPITAL_COMMUNITY): Payer: Self-pay | Admitting: Internal Medicine

## 2018-07-18 DIAGNOSIS — D72829 Elevated white blood cell count, unspecified: Secondary | ICD-10-CM | POA: Diagnosis not present

## 2018-07-18 DIAGNOSIS — I10 Essential (primary) hypertension: Secondary | ICD-10-CM

## 2018-07-18 DIAGNOSIS — K3184 Gastroparesis: Secondary | ICD-10-CM | POA: Diagnosis not present

## 2018-07-18 DIAGNOSIS — Z6841 Body Mass Index (BMI) 40.0 and over, adult: Secondary | ICD-10-CM | POA: Diagnosis not present

## 2018-07-18 DIAGNOSIS — M109 Gout, unspecified: Secondary | ICD-10-CM | POA: Diagnosis present

## 2018-07-18 DIAGNOSIS — N182 Chronic kidney disease, stage 2 (mild): Secondary | ICD-10-CM | POA: Diagnosis not present

## 2018-07-18 DIAGNOSIS — M797 Fibromyalgia: Secondary | ICD-10-CM | POA: Diagnosis present

## 2018-07-18 DIAGNOSIS — R109 Unspecified abdominal pain: Secondary | ICD-10-CM

## 2018-07-18 DIAGNOSIS — E875 Hyperkalemia: Secondary | ICD-10-CM | POA: Diagnosis present

## 2018-07-18 DIAGNOSIS — K760 Fatty (change of) liver, not elsewhere classified: Secondary | ICD-10-CM | POA: Diagnosis present

## 2018-07-18 DIAGNOSIS — E1122 Type 2 diabetes mellitus with diabetic chronic kidney disease: Secondary | ICD-10-CM | POA: Diagnosis present

## 2018-07-18 DIAGNOSIS — N183 Chronic kidney disease, stage 3 (moderate): Secondary | ICD-10-CM | POA: Diagnosis present

## 2018-07-18 DIAGNOSIS — E785 Hyperlipidemia, unspecified: Secondary | ICD-10-CM | POA: Diagnosis present

## 2018-07-18 DIAGNOSIS — Z8673 Personal history of transient ischemic attack (TIA), and cerebral infarction without residual deficits: Secondary | ICD-10-CM | POA: Diagnosis not present

## 2018-07-18 DIAGNOSIS — I129 Hypertensive chronic kidney disease with stage 1 through stage 4 chronic kidney disease, or unspecified chronic kidney disease: Secondary | ICD-10-CM | POA: Diagnosis present

## 2018-07-18 DIAGNOSIS — Z794 Long term (current) use of insulin: Secondary | ICD-10-CM | POA: Diagnosis not present

## 2018-07-18 DIAGNOSIS — R0789 Other chest pain: Secondary | ICD-10-CM | POA: Diagnosis not present

## 2018-07-18 DIAGNOSIS — Z833 Family history of diabetes mellitus: Secondary | ICD-10-CM | POA: Diagnosis not present

## 2018-07-18 DIAGNOSIS — Z20828 Contact with and (suspected) exposure to other viral communicable diseases: Secondary | ICD-10-CM | POA: Diagnosis present

## 2018-07-18 DIAGNOSIS — Z79899 Other long term (current) drug therapy: Secondary | ICD-10-CM | POA: Diagnosis not present

## 2018-07-18 DIAGNOSIS — E860 Dehydration: Secondary | ICD-10-CM | POA: Diagnosis present

## 2018-07-18 DIAGNOSIS — Z8249 Family history of ischemic heart disease and other diseases of the circulatory system: Secondary | ICD-10-CM | POA: Diagnosis not present

## 2018-07-18 DIAGNOSIS — K219 Gastro-esophageal reflux disease without esophagitis: Secondary | ICD-10-CM | POA: Diagnosis present

## 2018-07-18 DIAGNOSIS — R1110 Vomiting, unspecified: Secondary | ICD-10-CM | POA: Diagnosis present

## 2018-07-18 DIAGNOSIS — N179 Acute kidney failure, unspecified: Secondary | ICD-10-CM | POA: Diagnosis present

## 2018-07-18 DIAGNOSIS — E1165 Type 2 diabetes mellitus with hyperglycemia: Secondary | ICD-10-CM | POA: Diagnosis present

## 2018-07-18 DIAGNOSIS — E1143 Type 2 diabetes mellitus with diabetic autonomic (poly)neuropathy: Secondary | ICD-10-CM | POA: Diagnosis not present

## 2018-07-18 DIAGNOSIS — Z7982 Long term (current) use of aspirin: Secondary | ICD-10-CM | POA: Diagnosis not present

## 2018-07-18 DIAGNOSIS — R112 Nausea with vomiting, unspecified: Secondary | ICD-10-CM | POA: Diagnosis not present

## 2018-07-18 DIAGNOSIS — G8929 Other chronic pain: Secondary | ICD-10-CM | POA: Diagnosis present

## 2018-07-18 LAB — BASIC METABOLIC PANEL
Anion gap: 15 (ref 5–15)
BUN: 24 mg/dL — ABNORMAL HIGH (ref 6–20)
CO2: 24 mmol/L (ref 22–32)
Calcium: 9 mg/dL (ref 8.9–10.3)
Chloride: 99 mmol/L (ref 98–111)
Creatinine, Ser: 1.54 mg/dL — ABNORMAL HIGH (ref 0.44–1.00)
GFR calc Af Amer: 44 mL/min — ABNORMAL LOW (ref >60)
GFR calc non Af Amer: 38 mL/min — ABNORMAL LOW (ref >60)
Glucose, Bld: 245 mg/dL — ABNORMAL HIGH (ref 70–99)
Potassium: 3.9 mmol/L (ref 3.5–5.1)
Sodium: 138 mmol/L (ref 135–145)

## 2018-07-18 LAB — CBC
HCT: 31 % — ABNORMAL LOW (ref 36.0–46.0)
Hemoglobin: 9.4 g/dL — ABNORMAL LOW (ref 12.0–15.0)
MCH: 27.9 pg (ref 26.0–34.0)
MCHC: 30.3 g/dL (ref 30.0–36.0)
MCV: 92 fL (ref 80.0–100.0)
Platelets: 334 10*3/uL (ref 150–400)
RBC: 3.37 MIL/uL — ABNORMAL LOW (ref 3.87–5.11)
RDW: 14.7 % (ref 11.5–15.5)
WBC: 14.2 10*3/uL — ABNORMAL HIGH (ref 4.0–10.5)
nRBC: 0 % (ref 0.0–0.2)

## 2018-07-18 LAB — GLUCOSE, CAPILLARY
Glucose-Capillary: 226 mg/dL — ABNORMAL HIGH (ref 70–99)
Glucose-Capillary: 226 mg/dL — ABNORMAL HIGH (ref 70–99)
Glucose-Capillary: 298 mg/dL — ABNORMAL HIGH (ref 70–99)

## 2018-07-18 LAB — TSH: TSH: 2.381 u[IU]/mL (ref 0.350–4.500)

## 2018-07-18 MED ORDER — CAPSAICIN 0.025 % EX CREA
TOPICAL_CREAM | Freq: Two times a day (BID) | CUTANEOUS | Status: DC
  Administered 2018-07-18 (×2): via TOPICAL
  Filled 2018-07-18: qty 60

## 2018-07-18 MED ORDER — ENOXAPARIN SODIUM 80 MG/0.8ML ~~LOC~~ SOLN
75.0000 mg | Freq: Every day | SUBCUTANEOUS | Status: DC
  Administered 2018-07-18 – 2018-07-19 (×2): 75 mg via SUBCUTANEOUS
  Filled 2018-07-18 (×2): qty 0.8

## 2018-07-18 MED ORDER — SODIUM CHLORIDE 0.9 % IV BOLUS
500.0000 mL | Freq: Once | INTRAVENOUS | Status: AC
  Administered 2018-07-18: 500 mL via INTRAVENOUS

## 2018-07-18 MED ORDER — ONDANSETRON HCL 4 MG/2ML IJ SOLN: 4.0000 mg | Freq: Four times a day (QID) | INTRAMUSCULAR | Status: DC | PRN

## 2018-07-18 MED ORDER — MORPHINE SULFATE (PF) 2 MG/ML IV SOLN
1.0000 mg | Freq: Four times a day (QID) | INTRAVENOUS | Status: DC | PRN
  Administered 2018-07-18 – 2018-07-19 (×3): 1 mg via INTRAVENOUS
  Filled 2018-07-18 (×3): qty 1

## 2018-07-18 MED ORDER — INSULIN GLARGINE 100 UNIT/ML ~~LOC~~ SOLN
48.0000 [IU] | Freq: Every day | SUBCUTANEOUS | Status: DC
  Administered 2018-07-18 – 2018-07-19 (×2): 48 [IU] via SUBCUTANEOUS
  Filled 2018-07-18 (×3): qty 0.48

## 2018-07-18 NOTE — Progress Notes (Addendum)
[facility] PROGRESS NOTE  Deborah [name] OFB:[id number] DOB: 10/16/1965 DOA: 07/17/2018 PCP: Patient, No Pcp Per  Assessment/Plan:  Nausea, vomiting, abdominal pain. Able to tolerate fluids and small meals this am. Continues to complain abdominal pain but improved. Lipase and LFTs normal.White count 15.9 with left shift. Afebrile and non-toxic appearing.  -CT abdomen pelvis without contrast to check for possible intra-abdominal source of infection pending -IV fluid hydration -advance diet - continue IV Reglan 10 mg every 6 hours -IV Zofran prn -IV morphine 1 mg every 4 hours as needed for abdominal pain  Leukocytosis. Wbc trending down. She is afebrile and non-toxic appearing. UA not suggestive of infection. Chest x-ray showing asymmetric mild opacity at the right lung base, nonspecific.Pneumonia less likely given no cough, tachypnea, or hypoxia. Procalcitonin within limits of normal. CT abdomen pelvis Mild bibasilar atelectasis/infiltrate. Hepatic steatosis without biliary dilatation. Postop cholecystectomy, Small hiatal hernia, Normal appendix, 5 cm left uterine mass compatible with fibroid, unchanged. procalcitonin within limits of normal.  -supportive therapy -cbc in am  Abdominal pain/Chest pain.Patient reported left-sided pressure-like chest pain which radiated to her left arm. Chest pain started after she vomited. It lasted several hours and resolved after she received fentanyl in the ED. Chest Pain free ever since.  Troponin negative and EKG not suggestive of ACS. Cardiac cath done at [facility] in July 2019 revealed normal coronary arteries. -see above  Mild hyperkalemia. Resolved.  Potassium 3.9. EKG without acute changes. Potassium supplement listed in home medications was prescribed in September 2019. No events on tele. Provided with Calcium gluconate 1 g and -NovoLog 10 units at admission. -monitor  Hyperglycemia, type 2 diabetes. Serum glucose 245 this am.  No signs of DKA. HgA1c 9.1. -increase Lantus 48 units at bedtime per diabetes coordinator recommendation -Sliding scale insulin moderate and CBG checks -monitor  Mild AKI on CKD 3 BUN 24. Creatinine 1.5, baseline 1.2. Likely prerenal from dehydration. -IV fluid hydration -Continue to monitor BMP -Monitor urine output  GERD -IV Protonix Code Status: full Family Communication: plan discussed with patient Disposition Plan: back to facility hopefully tomorrow   Consultants:    Procedures:    Antibiotics:    HPI/Subjective: Patient admitted 07/17/18 with persistent abdominal pain and nausea/vomiting in setting of uncontrolled diabetes concerning for gastroparesis.   Objective: Vitals:   07/18/18 1041 07/18/18 1300  BP: 119/60 118/65  Pulse: (!) 111 (!) 106  Resp:    Temp:  98.7 F (37.1 C)  SpO2:  92%    Intake/Output Summary (Last 24 hours) at 07/18/2018 1338 Last data filed at 07/18/2018 1119 Gross per 24 hour  Intake 1823.5 ml  Output 1250 ml  Net 573.5 ml   Filed Weights   07/17/18 0317 07/17/18 0839 07/18/18 0059  Weight: (!) 157.9 kg (!) 155.6 kg (!) 155.4 kg    Exam:   General:  Awake alert obese sitting on side of bed  Cardiovascular: tachycardia but regular, no mgr trace LE edema  Respiratory: normal effort BS distant but clear no wheeze  Abdomen: obese +BS soft non-tender to palpation no guarding  Musculoskeletal: joints without swelling/erythema   Data Reviewed: Basic Metabolic Panel: Recent Labs  Lab 07/17/18 0320 07/17/18 1246 07/18/18 0520  NA 135 136 138  K 5.4* 4.5 3.9  CL 100 103 99  CO2 21* 22 24  GLUCOSE 385* 317* 245*  BUN 25* 30* 24*  CREATININE 1.58* 1.82* 1.54*  CALCIUM 9.8 9.0 9.0   Liver Function Tests: Recent Labs  Lab 07/17/18  0320  AST 34  ALT 29  ALKPHOS 99  BILITOT 0.7  PROT 7.9  ALBUMIN 3.6   Recent Labs  Lab 07/17/18 0320  LIPASE 27   No results for input(s): AMMONIA in the last 168  hours. CBC: Recent Labs  Lab 07/17/18 0320 07/18/18 0520  WBC 15.9* 14.2*  NEUTROABS 13.6*  --   HGB 10.0* 9.4*  HCT 31.5* 31.0*  MCV 89.7 92.0  PLT 343 334   Cardiac Enzymes: Recent Labs  Lab 07/17/18 0320 07/17/18 0648 07/17/18 1246  TROPONINI <0.03 <0.03 <0.03   BNP (last 3 results) No results for input(s): BNP in the last 8760 hours.  ProBNP (last 3 results) No results for input(s): PROBNP in the last 8760 hours.  CBG: Recent Labs  Lab 07/17/18 0308 07/17/18 1748 07/17/18 2103 07/18/18 0631 07/18/18 1116  GLUCAP 347* 229* 210* 226* 226*    Recent Results (from the past 240 hour(s))  SARS Coronavirus 2 (CEPHEID - Performed in [place] hospital lab), Hosp Order     Status: None   Collection Time: 07/17/18  5:15 AM  Result Value Ref Range Status   SARS Coronavirus 2 NEGATIVE NEGATIVE Final    Comment: (NOTE) If result is NEGATIVE SARS-CoV-2 target nucleic acids are NOT DETECTED. The SARS-CoV-2 RNA is generally detectable in upper and lower  respiratory specimens during the acute phase of infection. The lowest  concentration of SARS-CoV-2 viral copies this assay can detect is 250  copies / mL. A negative result does not preclude SARS-CoV-2 infection  and should not be used as the sole basis for treatment or other  patient management decisions.  A negative result may occur with  improper specimen collection / handling, submission of specimen other  than nasopharyngeal swab, presence of viral mutation(s) within the  areas targeted by this assay, and inadequate number of viral copies  (<250 copies / mL). A negative result must be combined with clinical  observations, patient history, and epidemiological information. If result is POSITIVE SARS-CoV-2 target nucleic acids are DETECTED. The SARS-CoV-2 RNA is generally detectable in upper and lower  respiratory specimens dur ing the acute phase of infection.  Positive  results are indicative of active  infection with SARS-CoV-2.  Clinical  correlation with patient history and other diagnostic information is  necessary to determine patient infection status.  Positive results do  not rule out bacterial infection or co-infection with other viruses. If result is PRESUMPTIVE POSTIVE SARS-CoV-2 nucleic acids MAY BE PRESENT.   A presumptive positive result was obtained on the submitted specimen  and confirmed on repeat testing.  While 2019 novel coronavirus  (SARS-CoV-2) nucleic acids may be present in the submitted sample  additional confirmatory testing may be necessary for epidemiological  and / or clinical management purposes  to differentiate between  SARS-CoV-2 and other Sarbecovirus currently known to infect humans.  If clinically indicated additional testing with an alternate test  methodology [phone]) is advised. The SARS-CoV-2 RNA is generally  detectable in upper and lower respiratory sp ecimens during the acute  phase of infection. The expected result is Negative. Fact Sheet for Patients:  StrictlyIdeas.no Fact Sheet for Healthcare Providers: BankingDealers.co.za This test is not yet approved or cleared by the [place] FDA and has been authorized for detection and/or diagnosis of SARS-CoV-2 by FDA under an Emergency Use Authorization (EUA).  This EUA will remain in effect (meaning this test can be used) for the duration of the COVID-19 declaration under Section 564(b)(1) of  the Act, 21 U.S.C. section 360bbb-3(b)(1), unless the authorization is terminated or revoked sooner. Performed at [facility], [place] [address] [place], [place] [address]   MRSA PCR Screening     Status: None   Collection Time: 07/17/18  9:12 PM  Result Value Ref Range Status   MRSA by PCR NEGATIVE NEGATIVE Final    Comment:        The GeneXpert MRSA Assay (FDA approved for NASAL specimens only), is one component of a comprehensive MRSA  colonization surveillance program. It is not intended to diagnose MRSA infection nor to guide or monitor treatment for MRSA infections. Performed at [facility], [place] [address] [place],  [address]      Studies: Ct Abdomen Pelvis Wo Contrast  Result Date: 07/17/2018 CLINICAL DATA:  Nausea vomiting left lower quadrant pain EXAM: CT ABDOMEN AND PELVIS WITHOUT CONTRAST TECHNIQUE: Multidetector CT imaging of the abdomen and pelvis was performed following the standard protocol without IV contrast. COMPARISON:  CT abdomen pelvis 07/30/2017 FINDINGS: Lower chest: Mild bibasilar airspace disease posteriorly may represent atelectasis or infection. No effusion. Heart size within normal limits. Hepatobiliary: Hepatic steatosis as noted previously. No focal liver lesion. Postop cholecystectomy. No biliary dilatation. Pancreas: Negative Spleen: Negative Adrenals/Urinary Tract: Adrenal glands are unremarkable. Kidneys are normal, without renal calculi, focal lesion, or hydronephrosis. Bladder is unremarkable. Stomach/Bowel: Normal stomach. Small hiatal hernia. Negative for bowel obstruction. Negative for bowel thickening or edema. Negative for diverticulitis. Normal appendix. Vascular/Lymphatic: No significant vascular findings are present. No enlarged abdominal or pelvic lymph nodes. Reproductive: 5 cm solid mass left uterine fundus unchanged from prior studies and most compatible with uterine fibroid. No other pelvic mass Other: No free fluid Musculoskeletal: Disc degeneration and spurring at L5-S1. Facet degeneration L4-5 and L5-S1. No acute skeletal abnormality. IMPRESSION: 1. Mild bibasilar atelectasis/infiltrate. 2. Hepatic steatosis without biliary dilatation. Postop cholecystectomy 3. Small hiatal hernia 4. Normal appendix 5. 5 cm left uterine mass compatible with fibroid, unchanged. Electronically Signed   By: Franchot Gallo M.D.   On: 07/17/2018 07:59   Dg Chest Port 1 View  Result Date:  07/17/2018 CLINICAL DATA:  53 year old female with chest pain and shortness of breath. EXAM: PORTABLE CHEST 1 VIEW COMPARISON:  Chest CTA and radiographs 07/29/2017 and earlier. FINDINGS: Portable AP semi upright view at 0330 hours. Mildly lower lung volumes. Mediastinal contours remain normal. Visualized tracheal air column is within normal limits. No pneumothorax, pulmonary edema or pleural effusion. Mild asymmetric increased patchy right lung base opacity. Elsewhere the lungs appear clear. Stable visualized osseous structures. IMPRESSION: Asymmetric mild opacity at the right lung base opacity is nonspecific, but might simply reflect atelectasis. No pleural effusion or other acute cardiopulmonary abnormality identified. Electronically Signed   By: Genevie Ann M.D.   On: 07/17/2018 04:20    Scheduled Meds: . allopurinol  100 mg Oral Daily  . aspirin EC  81 mg Oral Daily  . atorvastatin  10 mg Oral Daily  . DULoxetine  30 mg Oral BID  . enoxaparin (LOVENOX) injection  40 mg Subcutaneous Q24H  . insulin aspart  0-15 Units Subcutaneous TID WC  . insulin aspart  10 Units Subcutaneous Once  . insulin glargine  48 Units Subcutaneous QHS  . Melatonin  6 mg Oral QHS  . methocarbamol  500 mg Oral BID  . metoCLOPramide (REGLAN) injection  10 mg Intravenous Q6H  . metoprolol succinate  25 mg Oral Daily  . pantoprazole  40 mg Oral  Daily   Continuous Infusions:  Principal Problem:   Nausea and vomiting Active Problems:   Abdominal pain   Hyperkalemia   Essential hypertension, benign   Uncontrolled type 2 diabetes mellitus with diabetic neuropathy, with long-term current use of insulin (HCC)   CKD (chronic kidney disease), stage II   Chest pain   GERD (gastroesophageal reflux disease)   Obesity   Leukocytosis   Vomiting    Time spent: 63 minutes    [place] NP [facility]  If 7PM-7AM, please contact night-coverage at [email or web address], password [facility] 07/18/2018, 1:38 PM  LOS: 0 days

## 2018-07-18 NOTE — Progress Notes (Signed)
Patient's heart rate elevates after standing

## 2018-07-18 NOTE — Progress Notes (Signed)
Inpatient Diabetes Program Recommendations  AACE/ADA: New Consensus Statement on Inpatient Glycemic Control (2015)  Target Ranges:  Prepandial:   less than 140 mg/dL      Peak postprandial:   less than 180 mg/dL (1-2 hours)      Critically ill patients:  140 - 180 mg/dL   Lab Results  Component Value Date   GLUCAP 226 (H) 07/18/2018   HGBA1C 9.1 (H) 07/17/2018    Review of Glycemic Control Results for IRELAND, VIRRUETA (MRN [id number]) as of 07/18/2018 09:47  Ref. Range 07/17/2018 03:08 07/17/2018 17:48 07/17/2018 21:03 07/18/2018 06:31  Glucose-Capillary Latest Ref Range: 70 - 99 mg/dL 347 (H) 229 (H) 210 (H) 226 (H)   Diabetes history: DM 2, Gastroparesis Outpatient Diabetes medications: Lantus 40 units, Humalog 10 units tid with meals Current orders for Inpatient glycemic control: Lantus 40 units , Novolog 0-15 units tid  Inpatient Diabetes Program Recommendations:    Noted consult for recs. Patient has not have follow up care for DM since 07/2017.  Given current glucose trends and FSBG was 226 mg/dL, consider increasing Lantus to 48 units QHS.   Will continue to follow.   Thanks, Bronson Curb, MSN, RNC-OB Diabetes Coordinator [phone] (8a-5p)

## 2018-07-18 NOTE — Progress Notes (Signed)
Patient c/o having a hard time breathing. States it started after she used the [facility]. States it is the worst when she tries to take a deep breath because it causes her chest to hurt. When palpating her sternum, pain was able to be reproduced. Lungs were clear to auscultation. Oxygen saturation was 97% on room air. States she has had this type of pain in the past whenever she has gastroparesis. Made statement that it "feels like pneumonia." MD contacted and made aware. New orders placed.

## 2018-07-19 ENCOUNTER — Inpatient Hospital Stay (HOSPITAL_COMMUNITY): Payer: Medicaid Other

## 2018-07-19 DIAGNOSIS — Z794 Long term (current) use of insulin: Secondary | ICD-10-CM

## 2018-07-19 DIAGNOSIS — E1140 Type 2 diabetes mellitus with diabetic neuropathy, unspecified: Secondary | ICD-10-CM

## 2018-07-19 DIAGNOSIS — Z6841 Body Mass Index (BMI) 40.0 and over, adult: Secondary | ICD-10-CM

## 2018-07-19 DIAGNOSIS — J181 Lobar pneumonia, unspecified organism: Secondary | ICD-10-CM

## 2018-07-19 DIAGNOSIS — E1165 Type 2 diabetes mellitus with hyperglycemia: Secondary | ICD-10-CM

## 2018-07-19 DIAGNOSIS — R0789 Other chest pain: Secondary | ICD-10-CM

## 2018-07-19 DIAGNOSIS — D72829 Elevated white blood cell count, unspecified: Secondary | ICD-10-CM

## 2018-07-19 DIAGNOSIS — N182 Chronic kidney disease, stage 2 (mild): Secondary | ICD-10-CM

## 2018-07-19 LAB — GLUCOSE, CAPILLARY
Glucose-Capillary: 258 mg/dL — ABNORMAL HIGH (ref 70–99)
Glucose-Capillary: 181 mg/dL — ABNORMAL HIGH (ref 70–99)
Glucose-Capillary: 238 mg/dL — ABNORMAL HIGH (ref 70–99)
Glucose-Capillary: 223 mg/dL — ABNORMAL HIGH (ref 70–99)
Glucose-Capillary: 302 mg/dL — ABNORMAL HIGH (ref 70–99)
Glucose-Capillary: 271 mg/dL — ABNORMAL HIGH (ref 70–99)
Glucose-Capillary: 202 mg/dL — ABNORMAL HIGH (ref 70–99)

## 2018-07-19 LAB — BASIC METABOLIC PANEL
Anion gap: 10 (ref 5–15)
BUN: 20 mg/dL (ref 6–20)
CO2: 25 mmol/L (ref 22–32)
Calcium: 8.6 mg/dL — ABNORMAL LOW (ref 8.9–10.3)
Chloride: 104 mmol/L (ref 98–111)
Creatinine, Ser: 1.59 mg/dL — ABNORMAL HIGH (ref 0.44–1.00)
GFR calc Af Amer: 42 mL/min — ABNORMAL LOW (ref >60)
GFR calc non Af Amer: 36 mL/min — ABNORMAL LOW (ref >60)
Glucose, Bld: 197 mg/dL — ABNORMAL HIGH (ref 70–99)
Potassium: 4 mmol/L (ref 3.5–5.1)
Sodium: 139 mmol/L (ref 135–145)

## 2018-07-19 MED ORDER — INSULIN GLARGINE 100 UNIT/ML ~~LOC~~ SOLN
48.0000 [IU] | Freq: Every day | SUBCUTANEOUS | Status: DC
  Administered 2018-07-20: 11:00:00 48 [IU] via SUBCUTANEOUS
  Filled 2018-07-19: qty 0.48

## 2018-07-19 MED ORDER — LEVOFLOXACIN 750 MG PO TABS
750.0000 mg | ORAL_TABLET | Freq: Every day | ORAL | Status: DC
  Administered 2018-07-19 – 2018-07-20 (×2): 750 mg via ORAL
  Filled 2018-07-19 (×2): qty 1

## 2018-07-19 MED ORDER — SODIUM CHLORIDE 0.9 % IV BOLUS
250.0000 mL | Freq: Once | INTRAVENOUS | Status: AC
  Administered 2018-07-19: 07:00:00 250 mL via INTRAVENOUS

## 2018-07-19 NOTE — Progress Notes (Addendum)
Patient received a extra dose of lantus  in error, baseline blood sugar 181 current blood sugar 238, Dr Cyndia Skeeters aware not concerned with blood sugar dropped due to high  Blood sugar  and plan to check blood sugar as scheduled per MD.

## 2018-07-19 NOTE — Progress Notes (Signed)
Inpatient Diabetes Program Recommendations  AACE/ADA: New Consensus Statement on Inpatient Glycemic Control (2015)  Target Ranges:  Prepandial:   less than 140 mg/dL      Peak postprandial:   less than 180 mg/dL (1-2 hours)      Critically ill patients:  140 - 180 mg/dL   Lab Results  Component Value Date   GLUCAP 181 (H) 07/19/2018   HGBA1C 9.1 (H) 07/17/2018    Review of Glycemic Control Results for CHIQUETTA, LANGNER (MRN [id number]) as of 07/19/2018 10:21  Ref. Range 07/18/2018 11:16 07/18/2018 16:00 07/18/2018 21:14 07/19/2018 06:03  Glucose-Capillary Latest Ref Range: 70 - 99 mg/dL 226 (H) 298 (H) 258 (H) 181 (H)   Diabetes history:DM 2, Gastroparesis Outpatient Diabetes medications: Lantus 40 units, Humalog 10 units tid with meals Current orders for Inpatient glycemic control: Lantus 48 units QHS, Novolog 0-15 units tid  Inpatient Diabetes Program Recommendations:    Noted administration of Lantus 48 units this AM, scheduled for QHS. Anticipate that trends will decrease today and patient may experience hypoglycemia.   Reached out to RN to verified administration instead of error. It was confirmed the medication was given. Encouraged RN to reach out to MD, check a CBG and complete SZP. Encouraged to utilize hypoglycemia protocol in the event patient experiences low blood sugar. Secure chat sent to Dr Cyndia Skeeters regarding frequency of [place]. May want to consider Q4H CBGs for the next 4 occurrences.  Will continue to follow.   Thanks, Bronson Curb, MSN, RNC-OB Diabetes Coordinator [phone] (8a-5p)

## 2018-07-19 NOTE — Progress Notes (Signed)
PROGRESS NOTE  Deborah [name] OIN:[id number] DOB: March 19, 1965 DOA: 07/17/2018 PCP: Patient, No Pcp Per   LOS: 1 day   Patient is from: Home  Brief Narrative / Interim history: 53 year old female with history of poorly controlled DM-2, gastroparesis, GERD, chronic pain, gout, hypertension, hyperlipidemia, CVA and morbid obesity presenting with nausea and vomiting for 4 days prior to admission likely due to gastroparesis.  In ED, febrile but tachycardic.  Leukocytosis to 15.9 with left shift.  Hemoglobin 10.0 (greater than baseline).  Mild hyperkalemia to 5.4.  Blood glucose 185.  Creatinine 1.5 (baseline 1.2).  Lipase and LFTs normal.  Troponin and EKG not impressive.  COVID-19 rapid test negative.  Urinalysis not impressive.  CXR with a symmetric mild opacity on the right lung base.  Subjective: No major events overnight of this morning.  Received extra dose of Lantus 38 units by mistake this morning.  Complains about shortness of breath, nasal and chest congestion and chest tightness.  Nausea and emesis improved.  Tolerated diet this morning.   Assessment & Plan: Principal Problem:   Nausea and vomiting Active Problems:   Essential hypertension, benign   Uncontrolled type 2 diabetes mellitus with diabetic neuropathy, with long-term current use of insulin (HCC)   CKD (chronic kidney disease), stage II   Chest pain   GERD (gastroesophageal reflux disease)   Obesity   Leukocytosis   Abdominal pain   Hyperkalemia   Vomiting  Dyspnea/chest tightness/aspiration pneumonia: CT abdomen, initial and repeat CXR demonstrated airspace opacity at right lung base.  Patient had leukocytosis which could also be stress-induced.  She also had a mild temp to 100.3 on admission.  Serial troponin and EKG reassuring.  Reportedly have normal LHC at [facility] in 09/2017. -Start Levaquin.  Severe allergy to penicillin.  Nausea/vomiting/abdominal pain: suspect this to be due to gastroparesis in the  setting of poorly controlled diabetes.  Improving. -Continue Reglan as needed  Poorly controlled IDDM-2 with hyperglycemia and renal complication: B0J 6.2%. -Received 48 units this morning accidentally. -CBG monitoring -We will skip Lantus tonight and continue in the morning. -Continue statin.  CKD 3: Stable  GERD -Protonix  Morbid obesity: BMI 54.55. -Encourage lifestyle change to lose weight  Chronic pain/mood disorder: Stable -Continue home Cymbalta, Robaxin and Xanax -Discontinue IV morphine  Scheduled Meds: . allopurinol  100 mg Oral Daily  . aspirin EC  81 mg Oral Daily  . atorvastatin  10 mg Oral Daily  . capsaicin   Topical BID  . DULoxetine  30 mg Oral BID  . enoxaparin (LOVENOX) injection  75 mg Subcutaneous QHS  . insulin aspart  0-15 Units Subcutaneous TID WC  . insulin aspart  10 Units Subcutaneous Once  . insulin glargine  48 Units Subcutaneous QHS  . Melatonin  6 mg Oral QHS  . methocarbamol  500 mg Oral BID  . metoCLOPramide (REGLAN) injection  10 mg Intravenous Q6H  . metoprolol succinate  25 mg Oral Daily  . pantoprazole  40 mg Oral Daily   Continuous Infusions: PRN Meds:.ALPRAZolam, morphine injection, ondansetron (ZOFRAN) IV   DVT prophylaxis: Subcu Lovenox Code Status: Full code Family Communication: None at bedside Disposition Plan: Remains inpatient pending improvement in nausea and vomiting and p.o. intake.  Also started on Levaquin for pneumonia.  Consultants:   None  Procedures:   None  Microbiology: . COVID-19 and MRSA PCR negative.  Antimicrobials: Anti-infectives (From admission, onward)   None       Objective: Vitals:   07/18/18 1911  07/19/18 0537 07/19/18 0815 07/19/18 1206  BP: (!) 117/47 129/69 (!) 113/46 (!) 116/58  Pulse: (!) 112 (!) 113 (!) 114 (!) 105  Resp: 18 18  18   Temp: 98.5 F (36.9 C) 100 F (37.8 C) 99.2 F (37.3 C) 98.8 F (37.1 C)  TempSrc: Oral Oral Oral Oral  SpO2: 91% (!) 88% 97% 91%   Weight:  (!) 153.3 kg    Height:        Intake/Output Summary (Last 24 hours) at 07/19/2018 1647 Last data filed at 07/19/2018 1300 Gross per 24 hour  Intake 420 ml  Output -  Net 420 ml   Filed Weights   07/17/18 0839 07/18/18 0059 07/19/18 0537  Weight: (!) 155.6 kg (!) 155.4 kg (!) 153.3 kg    Examination:  GENERAL: No acute distress.  Morbidly obese. HEENT: MMM.  Vision and hearing grossly intact.  NECK: Supple.  Difficult to assess JVD. LUNGS:  No IWOB.  Crackles over right lower lung field HEART:  RRR . Heart sounds normal.  ABD: Bowel sounds present. Soft.  Mild diffuse tenderness MSK/EXT:  Moves all extremities. No apparent deformity. No edema bilaterally.  SKIN: no apparent skin lesion or wound NEURO: Awake, alert and oriented appropriately.  No gross deficit.  PSYCH: Flat affect   Data Reviewed: I have independently reviewed following labs and imaging studies   CBC: Recent Labs  Lab 07/17/18 0320 07/18/18 0520  WBC 15.9* 14.2*  NEUTROABS 13.6*  --   HGB 10.0* 9.4*  HCT 31.5* 31.0*  MCV 89.7 92.0  PLT 343 956   Basic Metabolic Panel: Recent Labs  Lab 07/17/18 0320 07/17/18 1246 07/18/18 0520 07/19/18 0423  NA 135 136 138 139  K 5.4* 4.5 3.9 4.0  CL 100 103 99 104  CO2 21* 22 24 25   GLUCOSE 385* 317* 245* 197*  BUN 25* 30* 24* 20  CREATININE 1.58* 1.82* 1.54* 1.59*  CALCIUM 9.8 9.0 9.0 8.6*   GFR: Estimated Creatinine Clearance: 61.9 mL/min (A) (by C-G formula based on SCr of 1.59 mg/dL (H)). Liver Function Tests: Recent Labs  Lab 07/17/18 0320  AST 34  ALT 29  ALKPHOS 99  BILITOT 0.7  PROT 7.9  ALBUMIN 3.6   Recent Labs  Lab 07/17/18 0320  LIPASE 27   No results for input(s): [place]IA in the last 168 hours. Coagulation Profile: No results for input(s): INR, PROTIME in the last 168 hours. Cardiac Enzymes: Recent Labs  Lab 07/17/18 0320 07/17/18 0648 07/17/18 1246  TROPONINI <0.03 <0.03 <0.03   BNP (last 3 results) No  results for input(s): PROBNP in the last 8760 hours. HbA1C: Recent Labs    07/17/18 0320  HGBA1C 9.1*   CBG: Recent Labs  Lab 07/19/18 0603 07/19/18 1040 07/19/18 1209 07/19/18 1432 07/19/18 1608  GLUCAP 181* 238* 223* 302* 271*   Lipid Profile: No results for input(s): CHOL, HDL, LDLCALC, TRIG, CHOLHDL, LDLDIRECT in the last 72 hours. Thyroid Function Tests: Recent Labs    07/18/18 1558  TSH 2.381   Anemia Panel: No results for input(s): VITAMINB12, FOLATE, FERRITIN, TIBC, IRON, RETICCTPCT in the last 72 hours. Urine analysis:    Component Value Date/Time   COLORURINE YELLOW 07/17/2018 0537   APPEARANCEUR HAZY (A) 07/17/2018 0537   LABSPEC 1.021 07/17/2018 0537   PHURINE 5.0 07/17/2018 0537   GLUCOSEU >=500 (A) 07/17/2018 0537   HGBUR LARGE (A) 07/17/2018 0537   BILIRUBINUR NEGATIVE 07/17/2018 0537   KETONESUR NEGATIVE 07/17/2018 0537   PROTEINUR >=  300 (A) 07/17/2018 0537   UROBILINOGEN 0.2 10/02/2013 2108   NITRITE NEGATIVE 07/17/2018 [place] 07/17/2018 0537   Sepsis Labs: Invalid input(s): PROCALCITONIN, LACTICIDVEN  Recent Results (from the past 240 hour(s))  SARS Coronavirus 2 (CEPHEID - Performed in [place] hospital lab), Hosp Order     Status: None   Collection Time: 07/17/18  5:15 AM  Result Value Ref Range Status   SARS Coronavirus 2 NEGATIVE NEGATIVE Final    Comment: (NOTE) If result is NEGATIVE SARS-CoV-2 target nucleic acids are NOT DETECTED. The SARS-CoV-2 RNA is generally detectable in upper and lower  respiratory specimens during the acute phase of infection. The lowest  concentration of SARS-CoV-2 viral copies this assay can detect is 250  copies / mL. A negative result does not preclude SARS-CoV-2 infection  and should not be used as the sole basis for treatment or other  patient management decisions.  A negative result may occur with  improper specimen collection / handling, submission of specimen other  than  nasopharyngeal swab, presence of viral mutation(s) within the  areas targeted by this assay, and inadequate number of viral copies  (<250 copies / mL). A negative result must be combined with clinical  observations, patient history, and epidemiological information. If result is POSITIVE SARS-CoV-2 target nucleic acids are DETECTED. The SARS-CoV-2 RNA is generally detectable in upper and lower  respiratory specimens dur ing the acute phase of infection.  Positive  results are indicative of active infection with SARS-CoV-2.  Clinical  correlation with patient history and other diagnostic information is  necessary to determine patient infection status.  Positive results do  not rule out bacterial infection or co-infection with other viruses. If result is PRESUMPTIVE POSTIVE SARS-CoV-2 nucleic acids MAY BE PRESENT.   A presumptive positive result was obtained on the submitted specimen  and confirmed on repeat testing.  While 2019 novel coronavirus  (SARS-CoV-2) nucleic acids may be present in the submitted sample  additional confirmatory testing may be necessary for epidemiological  and / or clinical management purposes  to differentiate between  SARS-CoV-2 and other Sarbecovirus currently known to infect humans.  If clinically indicated additional testing with an alternate test  methodology [phone]) is advised. The SARS-CoV-2 RNA is generally  detectable in upper and lower respiratory sp ecimens during the acute  phase of infection. The expected result is Negative. Fact Sheet for Patients:  StrictlyIdeas.no Fact Sheet for Healthcare Providers: BankingDealers.co.za This test is not yet approved or cleared by the [place] FDA and has been authorized for detection and/or diagnosis of SARS-CoV-2 by FDA under an Emergency Use Authorization (EUA).  This EUA will remain in effect (meaning this test can be used) for the duration of the  COVID-19 declaration under Section 564(b)(1) of the Act, 21 U.S.C. section 360bbb-3(b)(1), unless the authorization is terminated or revoked sooner. Performed at [facility], [place] [address], [place], [place] [address]   MRSA PCR Screening     Status: None   Collection Time: 07/17/18  9:12 PM  Result Value Ref Range Status   MRSA by PCR NEGATIVE NEGATIVE Final    Comment:        The GeneXpert MRSA Assay (FDA approved for NASAL specimens only), is one component of a comprehensive MRSA colonization surveillance program. It is not intended to diagnose MRSA infection nor to guide or monitor treatment for MRSA infections. Performed at [facility], [place] [address], [place], [place] [address]  Radiology Studies: Dg Chest 2 View  Result Date: 07/19/2018 CLINICAL DATA:  53 year old female with diabetes EXAM: CHEST - 2 VIEW COMPARISON:  07/17/2018, 07/09/2017 FINDINGS: Cardiomediastinal silhouette unchanged in size and contour. Low lung volumes persist with ill-defined airspace opacity at the right lung base. Patchy opacities at the posterior lung base on the lateral view. Uncertain if this represents overlying soft tissues and atelectasis or consolidation. No large pleural effusion.  No pneumothorax. No displaced fracture. IMPRESSION: Low lung volumes, with redemonstration of airspace opacity at the right lung base, potentially atelectasis or consolidation. Electronically Signed   By: Corrie Mckusick D.O.   On: 07/19/2018 13:19    Evynn Boutelle T. [facility] Pager [phone]  If 7PM-7AM, please contact night-coverage [email or web address] Password [facility] 07/19/2018, 4:47 PM

## 2018-07-20 DIAGNOSIS — K219 Gastro-esophageal reflux disease without esophagitis: Secondary | ICD-10-CM

## 2018-07-20 LAB — BASIC METABOLIC PANEL
Anion gap: 12 (ref 5–15)
BUN: 15 mg/dL (ref 6–20)
CO2: 25 mmol/L (ref 22–32)
Calcium: 9.1 mg/dL (ref 8.9–10.3)
Chloride: 101 mmol/L (ref 98–111)
Creatinine, Ser: 1.4 mg/dL — ABNORMAL HIGH (ref 0.44–1.00)
GFR calc Af Amer: 49 mL/min — ABNORMAL LOW (ref >60)
GFR calc non Af Amer: 42 mL/min — ABNORMAL LOW (ref >60)
Glucose, Bld: 223 mg/dL — ABNORMAL HIGH (ref 70–99)
Potassium: 3.8 mmol/L (ref 3.5–5.1)
Sodium: 138 mmol/L (ref 135–145)

## 2018-07-20 LAB — CBC
HCT: 27.6 % — ABNORMAL LOW (ref 36.0–46.0)
Hemoglobin: 8.6 g/dL — ABNORMAL LOW (ref 12.0–15.0)
MCH: 28.2 pg (ref 26.0–34.0)
MCHC: 31.2 g/dL (ref 30.0–36.0)
MCV: 90.5 fL (ref 80.0–100.0)
Platelets: 304 10*3/uL (ref 150–400)
RBC: 3.05 MIL/uL — ABNORMAL LOW (ref 3.87–5.11)
RDW: 14.4 % (ref 11.5–15.5)
WBC: 11.7 10*3/uL — ABNORMAL HIGH (ref 4.0–10.5)
nRBC: 0 % (ref 0.0–0.2)

## 2018-07-20 LAB — GLUCOSE, CAPILLARY
Glucose-Capillary: 227 mg/dL — ABNORMAL HIGH (ref 70–99)
Glucose-Capillary: 265 mg/dL — ABNORMAL HIGH (ref 70–99)

## 2018-07-20 LAB — MAGNESIUM: Magnesium: 1.6 mg/dL — ABNORMAL LOW (ref 1.7–2.4)

## 2018-07-20 MED ORDER — INSULIN GLARGINE 100 UNIT/ML ~~LOC~~ SOLN: 45.0000 [IU] | Freq: Every day | SUBCUTANEOUS | 11 refills | Status: DC

## 2018-07-20 MED ORDER — MAGNESIUM SULFATE 2 GM/50ML IV SOLN
2.0000 g | Freq: Once | INTRAVENOUS | Status: AC
  Administered 2018-07-20: 11:00:00 2 g via INTRAVENOUS
  Filled 2018-07-20: qty 50

## 2018-07-20 MED ORDER — LEVOFLOXACIN 750 MG PO TABS: 750.0000 mg | ORAL_TABLET | Freq: Every day | ORAL | 0 refills | Status: DC

## 2018-07-20 NOTE — TOC Transition Note (Signed)
Transition of Care [facility]) - CM/SW Discharge Note   Patient Details  Name: [name] MRN: [id number] Date of Birth: 11/22/1965  [facility]) CM/SW Contact:  Alberteen Sam, LCSW Phone Number: 07/20/2018, 2:23 PM   Clinical Narrative:     Patient will DC to: [place] Anticipated DC date: 07/20/2018 Family notified: None identified by patient to contact Transport AN:VBTY  Per MD patient ready for DC to [place], patient, patient's family, and facility notified of DC. Discharge Summary sent to facility. RN given number for report [phone]. DC packet on chart. Ambulance transport requested for patient.  CSW signing off.  [place], [place]   Final next level of care: [place] Barriers to Discharge: No Barriers Identified   Patient Goals and CMS Choice Patient states their goals for this hospitalization and ongoing recovery are:: to go back to [facility].gov Compare Post Acute Care list provided to:: Patient Choice offered to / list presented to : Patient  Discharge Placement PASRR number recieved: 07/20/18            Patient chooses bed at: [facility] Patient to be transferred to facility by: [facility] Name of family member notified: N/A    Discharge Plan and Services     Post Acute Care Choice: [place]                               Social Determinants of Health (SDOH) Interventions     Readmission Risk Interventions No flowsheet data found.

## 2018-07-20 NOTE — Evaluation (Signed)
Occupational Therapy Evaluation Patient Details Name: Deborah [name] MRN: [id number] DOB: [birth year] Today's Date: 07/20/2018    History of Present Illness Patient is a 53 y/o female presenting to the ED with primary complaints of vomiting and chest pain (from [facility]). Past medical history significant of type 2 diabetes, gastroparesis, GERD, chronic pain, gout, hypertension, hyperlipidemia, CVA.    Clinical Impression   Pt PTA: resident at [facility] and receiving therapy per pt. Pt mostly using w/c for mobility. Pt currently performing transfers and ADl functional mobility with Rw only up to 10' at a time. Pt performing light grooming tasks at sink with supervisionA, chair placed behind pt for safety and RLE with pain and weakness. Supervsion level for ADL. Pt reports wearing slip on shoes and gets pants on in bed. Pt would benefit from continued OT skilled services for ADL, mobility and safety in SNF setting. OT signing off.    Follow Up Recommendations  SNF;Supervision/Assistance - 24 hour    Equipment Recommendations  None recommended by OT    Recommendations for Other Services       Precautions / Restrictions Precautions Precautions: Fall Restrictions Weight Bearing Restrictions: No      Mobility Bed Mobility               General bed mobility comments: sitting EOB upon PT arrival  Transfers Overall transfer level: Needs assistance Equipment used: Rolling walker (2 wheeled) Transfers: Sit to/from [facility] Sit to Stand: Min assist Stand pivot transfers: Min assist       General transfer comment: Min A to power up at bedside; cueing for safety    Balance Overall balance assessment: Needs assistance Sitting-balance support: No upper extremity supported;Feet supported Sitting balance-Leahy Scale: Good     Standing balance support: Bilateral upper extremity supported;During functional activity Standing balance-Leahy Scale: Poor Standing  balance comment: reliant on UE support                           ADL either performed or assessed with clinical judgement   ADL Overall ADL's : Needs assistance/impaired;At baseline                                     Functional mobility during ADLs: Min guard;Rolling walker General ADL Comments: Pt currently set-upA for ADL and LB ADL with increased time and wears slip on shoes     Vision Baseline Vision/History: No visual deficits Vision Assessment?: No apparent visual deficits     Perception     Praxis      Pertinent Vitals/Pain Pain Assessment: Faces Faces Pain Scale: Hurts even more Pain Location: knees with weight bearing Pain Descriptors / Indicators: Aching;Discomfort;Grimacing Pain Intervention(s): Limited activity within patient's tolerance     Hand Dominance     Extremity/Trunk Assessment Upper Extremity Assessment Upper Extremity Assessment: Generalized weakness   Lower Extremity Assessment Lower Extremity Assessment: Generalized weakness   Cervical / Trunk Assessment Cervical / Trunk Assessment: Normal   Communication Communication Communication: No difficulties   Cognition Arousal/Alertness: Awake/alert Behavior During Therapy: WFL for tasks assessed/performed Overall Cognitive Status: Within Functional Limits for tasks assessed                                     General Comments  Exercises     Shoulder Instructions      Home Living Family/patient expects to be discharged to:: Skilled nursing facility                                 Additional Comments: [name]; was participating in rehab services      Prior Functioning/Environment Level of Independence: Needs assistance  Gait / Transfers Assistance Needed: short distances with RW              OT Problem List: Decreased strength;Decreased activity tolerance;Impaired balance (sitting and/or standing);Decreased  coordination;Pain      OT Treatment/Interventions:      OT Goals(Current goals can be found in the care plan section) Acute Rehab OT Goals Patient Stated Goal: return to rehab OT Goal Formulation: With patient Time For Goal Achievement: 08/03/18 Potential to Achieve Goals: Good  OT Frequency:     Barriers to D/C:            Co-evaluation              AM-PAC OT "6 Clicks" Daily Activity     Outcome Measure Help from another person eating meals?: None Help from another person taking care of personal grooming?: None Help from another person toileting, which includes using toliet, bedpan, or urinal?: None Help from another person bathing (including washing, rinsing, drying)?: A Little Help from another person to put on and taking off regular upper body clothing?: None Help from another person to put on and taking off regular lower body clothing?: A Little 6 Click Score: 22   End of Session Equipment Utilized During Treatment: Gait belt;Rolling walker Nurse Communication: Mobility status  Activity Tolerance: Patient tolerated treatment well Patient left: in bed;with call bell/phone within reach  OT Visit Diagnosis: Unsteadiness on feet (R26.81);Muscle weakness (generalized) (M62.81)                Time: 2563-8937 OT Time Calculation (min): 16 min Charges:  OT General Charges $OT Visit: 1 Visit OT Evaluation $OT Eval Moderate Complexity: 1 Mod  Darryl Nestle) Marsa Aris OTR/L Acute Rehabilitation Services Pager: [phone] Office: [place] 07/20/2018, 1:13 PM

## 2018-07-20 NOTE — Discharge Summary (Signed)
Physician Discharge Summary  SHALANA JARDIN QJJ:[id number] DOB: 01-13-1966 DOA: 07/17/2018  PCP: Patient, No Pcp Per  Admit date: 07/17/2018 Discharge date: 07/20/2018  Admitted From: [place] home Disposition: Back to nursing home  Recommendations for Outpatient Follow-up:  1. Follow up with PCP in 1-2 weeks 2. Please obtain CBC/BMP/Mag at follow up 3. Please follow up on the following pending results: None  Home Health: None Equipment/Devices: None.  Patient has a wheelchair at the nursing home.  Discharge Condition: Stable CODE STATUS: Full code  Hospital Course: 53 year old female with history of poorly controlled DM-2, gastroparesis, GERD, chronic pain, gout, hypertension, hyperlipidemia, CVA and morbid obesity presenting with nausea and vomiting for 4 days prior to admission likely due to gastroparesis.  In ED, febrile but tachycardic.  Leukocytosis to 15.9 with left shift.  Hemoglobin 10.0 (greater than baseline).  Mild hyperkalemia to 5.4.  Blood glucose 185.  Creatinine 1.5 (baseline 1.2).  Lipase and LFTs normal.  Troponin and EKG not impressive.  COVID-19 rapid test negative.  Urinalysis not impressive.  CXR with a symmetric mild opacity on the right lung base.  CT abdomen and pelvis and repeat chest x-ray demonstrated airspace opacity at right lung bases with no intra-abdominal finding.  Patient was admitted for N/V/abdominal pain likely due to gastroparesis.  She was also started on Levaquin for right lung pneumonia as noted on CXR and CT abdomen.  With that, patient symptoms resolved, and discharged back to nursing home.  See individual problem list below for more.  Discharge Diagnoses:  Dyspnea/chest tightness/aspiration pneumonia: CT abdomen, initial and repeat CXR demonstrated airspace opacity at right lung base.  Patient had leukocytosis which could also be stress-induced.  She also had a mild temp to 100.3 on admission.  Serial troponin and EKG  reassuring.  Reportedly have normal LHC at [facility] in 09/2017.  Started on Levaquin on 07/19/2018.  She has severe penicillin allergies.  Symptoms improved.  Discharged on Levaquin to complete 6 days course.  Nausea/vomiting/abdominal pain: suspect this to be due to gastroparesis in the setting of poorly controlled diabetes.  Resolved.  Discharged on Reglan as needed.   Poorly controlled IDDM-2 with hyperglycemia and renal complication: G8J 8.5%.  CBG improved.  Changed her Lantus schedule to morning to reduce risk of nocturnal hypoglycemia.  Continue mealtime insulin and a statin.  CKD 3: Stable  GERD -Protonix  Morbid obesity: BMI 54.55. -Encourage lifestyle change to lose weight  Chronic pain/mood disorder: Stable -Continue home Cymbalta, Robaxin and Xanax  Discharge Instructions  Discharge Instructions    Call MD for:  difficulty breathing, headache or visual disturbances   Complete by:  As directed    Call MD for:  extreme fatigue   Complete by:  As directed    Call MD for:  persistant dizziness or light-headedness   Complete by:  As directed    Call MD for:  persistant nausea and vomiting   Complete by:  As directed    Call MD for:  severe uncontrolled pain   Complete by:  As directed    Call MD for:  temperature >100.4   Complete by:  As directed    Diet - low sodium heart healthy   Complete by:  As directed    Diet Carb Modified   Complete by:  As directed    Discharge instructions   Complete by:  As directed    Please schedule follow-up with PCP in 1 to 2 weeks Ensure she completes  antibiotics course as prescribed Encourage lifestyle change to lose weight Adjust insulin as appropriate based on CBG.  May consider newer diabetic medications with cardiovascular benefits.   Increase activity slowly   Complete by:  As directed      Allergies as of 07/20/2018      Reactions   Contrast Media [iodinated Diagnostic Agents] Anaphylaxis   Cardiac arrest   Diazepam  [name]   Isovue [iopamidol] Anaphylaxis   Patient had seizure like activity and then code post 100 cc of isovue 300   Lisinopril Anaphylaxis   Tongue and mouth swelling   Penicillins Palpitations   Has patient had a PCN reaction causing immediate rash, facial/tongue/throat swelling, SOB or lightheadedness with hypotension: Yes, heart races Has patient had a PCN reaction causing severe rash involving mucus membranes or skin necrosis: No Has patient had a PCN reaction that required hospitalization: Yes  Has patient had a PCN reaction occurring within the last 10 years: No   Acetaminophen Nausea Only, Other (See Comments)   Irritates stomach ulcer  Abdominal pain   Tolmetin Nausea Only   Other reaction(s): Other (See Comments) ULCER   Aspirin Other (See Comments)   Irritates stomach ulcer    Food Hives, Swelling   Carrots, ketchup    Nsaids Other (See Comments)   ULCER   Tramadol Nausea And Vomiting      Medication List    STOP taking these medications   metroNIDAZOLE 500 MG tablet Commonly known as:  FLAGYL     TAKE these medications   allopurinol 100 MG tablet Commonly known as:  ZYLOPRIM Take 1 tablet (100 mg total) by mouth daily.   ALPRAZolam 0.25 MG tablet Commonly known as:  XANAX Take 0.25 mg by mouth 2 (two) times daily as needed for anxiety.   amLODipine 10 MG tablet Commonly known as:  NORVASC Take 1 tablet (10 mg total) by mouth daily.   atorvastatin 10 MG tablet Commonly known as:  LIPITOR Take 10 mg by mouth daily.   cetirizine 10 MG tablet Commonly known as:  ZYRTEC Take 10 mg by mouth daily.   Cyanocobalamin 1000 MCG/ML Kit Inject 1,000 mcg as directed every 30 (thirty) days.   DULoxetine 30 MG capsule Commonly known as:  Cymbalta Take 1 capsule (30 mg total) by mouth 2 (two) times daily.   Ecotrin Low Strength 81 MG EC tablet Generic drug:  aspirin Take 81 mg by mouth daily. Swallow whole.   guaiFENesin 100 MG/5ML  Soln Commonly known as:  ROBITUSSIN Take 5 mLs (100 mg total) by mouth every 4 (four) hours as needed for cough or to loosen phlegm.   insulin glargine 100 UNIT/ML injection Commonly known as:  Lantus Inject 0.45 mLs (45 Units total) into the skin at bedtime. What changed:  how much to take   insulin lispro 100 UNIT/ML injection Commonly known as:  HUMALOG Inject 10 Units into the skin 3 (three) times daily.   levofloxacin 750 MG tablet Commonly known as:  LEVAQUIN Take 1 tablet (750 mg total) by mouth daily. Start on 07/21/2018   Melatonin 3 MG Tabs Take 6 mg by mouth at bedtime.   methocarbamol 500 MG tablet Commonly known as:  ROBAXIN Take 500 mg by mouth 2 (two) times a day.   metoCLOPramide 10 MG tablet Commonly known as:  REGLAN Take 1 tablet (10 mg total) by mouth 3 (three) times daily before meals. What changed:  when to take this   metoprolol succinate  25 MG 24 hr tablet Commonly known as:  TOPROL-XL Take 1 tablet (25 mg total) by mouth daily.   multivitamin with minerals Tabs tablet Take 1 tablet by mouth daily.   nitroGLYCERIN 0.4 MG SL tablet Commonly known as:  NITROSTAT Place 0.4 mg under the tongue every 5 (five) minutes as needed for chest pain.   ondansetron 4 MG disintegrating tablet Commonly known as:  ZOFRAN-ODT Take 4 mg by mouth every 6 (six) hours as needed for nausea.   pantoprazole 40 MG tablet Commonly known as:  PROTONIX Take 1 tablet (40 mg total) by mouth 2 (two) times daily. What changed:  when to take this   potassium chloride 10 MEQ tablet Commonly known as:  K-DUR Take 2 tablets (20 mEq total) by mouth daily for 7 days.      Contact information for after-discharge care    [facility] .   Service:  Skilled Nursing Contact information: [place] [phone]             Consultations:  None  Procedures/Studies:  2D Echo: Not obtained this  admission  Ct Abdomen Pelvis Wo Contrast  Result Date: 07/17/2018 CLINICAL DATA:  Nausea vomiting left lower quadrant pain EXAM: CT ABDOMEN AND PELVIS WITHOUT CONTRAST TECHNIQUE: Multidetector CT imaging of the abdomen and pelvis was performed following the standard protocol without IV contrast. COMPARISON:  CT abdomen pelvis 07/30/2017 FINDINGS: Lower chest: Mild bibasilar airspace disease posteriorly may represent atelectasis or infection. No effusion. Heart size within normal limits. Hepatobiliary: Hepatic steatosis as noted previously. No focal liver lesion. Postop cholecystectomy. No biliary dilatation. Pancreas: Negative Spleen: Negative Adrenals/Urinary Tract: Adrenal glands are unremarkable. Kidneys are normal, without renal calculi, focal lesion, or hydronephrosis. Bladder is unremarkable. Stomach/Bowel: Normal stomach. Small hiatal hernia. Negative for bowel obstruction. Negative for bowel thickening or edema. Negative for diverticulitis. Normal appendix. Vascular/Lymphatic: No significant vascular findings are present. No enlarged abdominal or pelvic lymph nodes. Reproductive: 5 cm solid mass left uterine fundus unchanged from prior studies and most compatible with uterine fibroid. No other pelvic mass Other: No free fluid Musculoskeletal: Disc degeneration and spurring at L5-S1. Facet degeneration L4-5 and L5-S1. No acute skeletal abnormality. IMPRESSION: 1. Mild bibasilar atelectasis/infiltrate. 2. Hepatic steatosis without biliary dilatation. Postop cholecystectomy 3. Small hiatal hernia 4. Normal appendix 5. 5 cm left uterine mass compatible with fibroid, unchanged. Electronically Signed   By: Franchot Gallo M.D.   On: 07/17/2018 07:59   Dg Chest 2 View  Result Date: 07/19/2018 CLINICAL DATA:  53 year old female with diabetes EXAM: CHEST - 2 VIEW COMPARISON:  07/17/2018, 07/09/2017 FINDINGS: Cardiomediastinal silhouette unchanged in size and contour. Low lung volumes persist with ill-defined  airspace opacity at the right lung base. Patchy opacities at the posterior lung base on the lateral view. Uncertain if this represents overlying soft tissues and atelectasis or consolidation. No large pleural effusion.  No pneumothorax. No displaced fracture. IMPRESSION: Low lung volumes, with redemonstration of airspace opacity at the right lung base, potentially atelectasis or consolidation. Electronically Signed   By: Corrie Mckusick D.O.   On: 07/19/2018 13:19   Dg Chest Port 1 View  Result Date: 07/17/2018 CLINICAL DATA:  53 year old female with chest pain and shortness of breath. EXAM: PORTABLE CHEST 1 VIEW COMPARISON:  Chest CTA and radiographs 07/29/2017 and earlier. FINDINGS: Portable AP semi upright view at 0330 hours. Mildly lower lung volumes. Mediastinal contours remain normal. Visualized tracheal air column  is within normal limits. No pneumothorax, pulmonary edema or pleural effusion. Mild asymmetric increased patchy right lung base opacity. Elsewhere the lungs appear clear. Stable visualized osseous structures. IMPRESSION: Asymmetric mild opacity at the right lung base opacity is nonspecific, but might simply reflect atelectasis. No pleural effusion or other acute cardiopulmonary abnormality identified. Electronically Signed   By: Genevie Ann M.D.   On: 07/17/2018 04:20     Subjective: No major events overnight other than lack of sleep.  No further nausea, vomiting or abdominal pain.  Dyspnea improved.  No chest pain.  Discharge Exam: Vitals:   07/19/18 1941 07/20/18 0608  BP: 133/66 127/69  Pulse: (!) 111 (!) 103  Resp: 18 18  Temp:  98.5 F (36.9 C)  SpO2: 95% 98%    GENERAL: No acute distress.  Appears morbidly obese. HEENT: MMM.  Vision and hearing grossly intact.  NECK: Supple.  No JVD.  LUNGS:  No IWOB.  Fair air movement bilaterally. HEART:  RRR. Heart sounds normal.  ABD: Bowel sounds present. Soft. Non tender.  MSK/EXT:  Moves all extremities. No apparent deformity. No  edema bilaterally. SKIN: no apparent skin lesion or wound NEURO: Awake, alert and oriented appropriately.  No gross deficit.  PSYCH: Calm. Normal affect.   The results of significant diagnostics from this hospitalization (including imaging, microbiology, ancillary and laboratory) are listed below for reference.     Microbiology: Recent Results (from the past 240 hour(s))  SARS Coronavirus 2 (CEPHEID - Performed in [place] hospital lab), Hosp Order     Status: None   Collection Time: 07/17/18  5:15 AM  Result Value Ref Range Status   SARS Coronavirus 2 NEGATIVE NEGATIVE Final    Comment: (NOTE) If result is NEGATIVE SARS-CoV-2 target nucleic acids are NOT DETECTED. The SARS-CoV-2 RNA is generally detectable in upper and lower  respiratory specimens during the acute phase of infection. The lowest  concentration of SARS-CoV-2 viral copies this assay can detect is 250  copies / mL. A negative result does not preclude SARS-CoV-2 infection  and should not be used as the sole basis for treatment or other  patient management decisions.  A negative result may occur with  improper specimen collection / handling, submission of specimen other  than nasopharyngeal swab, presence of viral mutation(s) within the  areas targeted by this assay, and inadequate number of viral copies  (<250 copies / mL). A negative result must be combined with clinical  observations, patient history, and epidemiological information. If result is POSITIVE SARS-CoV-2 target nucleic acids are DETECTED. The SARS-CoV-2 RNA is generally detectable in upper and lower  respiratory specimens dur ing the acute phase of infection.  Positive  results are indicative of active infection with SARS-CoV-2.  Clinical  correlation with patient history and other diagnostic information is  necessary to determine patient infection status.  Positive results do  not rule out bacterial infection or co-infection with other viruses. If  result is PRESUMPTIVE POSTIVE SARS-CoV-2 nucleic acids MAY BE PRESENT.   A presumptive positive result was obtained on the submitted specimen  and confirmed on repeat testing.  While 2019 novel coronavirus  (SARS-CoV-2) nucleic acids may be present in the submitted sample  additional confirmatory testing may be necessary for epidemiological  and / or clinical management purposes  to differentiate between  SARS-CoV-2 and other Sarbecovirus currently known to infect humans.  If clinically indicated additional testing with an alternate test  methodology [phone]) is advised. The SARS-CoV-2 RNA is generally  detectable in upper and lower respiratory sp ecimens during the acute  phase of infection. The expected result is Negative. Fact Sheet for Patients:  StrictlyIdeas.no Fact Sheet for Healthcare Providers: BankingDealers.co.za This test is not yet approved or cleared by the [place] FDA and has been authorized for detection and/or diagnosis of SARS-CoV-2 by FDA under an Emergency Use Authorization (EUA).  This EUA will remain in effect (meaning this test can be used) for the duration of the COVID-19 declaration under Section 564(b)(1) of the Act, 21 U.S.C. section 360bbb-3(b)(1), unless the authorization is terminated or revoked sooner. Performed at [facility], [place] [address], [place], [place] [address]   MRSA PCR Screening     Status: None   Collection Time: 07/17/18  9:12 PM  Result Value Ref Range Status   MRSA by PCR NEGATIVE NEGATIVE Final    Comment:        The GeneXpert MRSA Assay (FDA approved for NASAL specimens only), is one component of a comprehensive MRSA colonization surveillance program. It is not intended to diagnose MRSA infection nor to guide or monitor treatment for MRSA infections. Performed at [facility], [place] [address] [place], [place] [address]      Labs: BNP (last 3 results) No  results for input(s): BNP in the last 8760 hours. Basic Metabolic Panel: Recent Labs  Lab 07/17/18 0320 07/17/18 1246 07/18/18 0520 07/19/18 0423 07/20/18 0524  NA 135 136 138 139 138  K 5.4* 4.5 3.9 4.0 3.8  CL 100 103 99 104 101  CO2 21* '22 24 25 25  ' GLUCOSE 385* 317* 245* 197* 223*  BUN 25* 30* 24* 20 15  CREATININE 1.58* 1.82* 1.54* 1.59* 1.40*  CALCIUM 9.8 9.0 9.0 8.6* 9.1  MG  --   --   --   --  1.6*   Liver Function Tests: Recent Labs  Lab 07/17/18 0320  AST 34  ALT 29  ALKPHOS 99  BILITOT 0.7  PROT 7.9  ALBUMIN 3.6   Recent Labs  Lab 07/17/18 0320  LIPASE 27   No results for input(s): AMMONIA in the last 168 hours. CBC: Recent Labs  Lab 07/17/18 0320 07/18/18 0520 07/20/18 0524  WBC 15.9* 14.2* 11.7*  NEUTROABS 13.6*  --   --   HGB 10.0* 9.4* 8.6*  HCT 31.5* 31.0* 27.6*  MCV 89.7 92.0 90.5  PLT 343 334 304   Cardiac Enzymes: Recent Labs  Lab 07/17/18 0320 07/17/18 0648 07/17/18 1246  TROPONINI <0.03 <0.03 <0.03   BNP: Invalid input(s): POCBNP CBG: Recent Labs  Lab 07/19/18 1432 07/19/18 1608 07/19/18 2112 07/20/18 0639 07/20/18 1145  GLUCAP 302* 271* 202* 227* 265*   D-Dimer No results for input(s): DDIMER in the last 72 hours. Hgb A1c No results for input(s): HGBA1C in the last 72 hours. Lipid Profile No results for input(s): CHOL, HDL, LDLCALC, TRIG, CHOLHDL, LDLDIRECT in the last 72 hours. Thyroid function studies Recent Labs    07/18/18 1558  TSH 2.381   Anemia work up No results for input(s): VITAMINB12, FOLATE, FERRITIN, TIBC, IRON, RETICCTPCT in the last 72 hours. Urinalysis    Component Value Date/Time   COLORURINE YELLOW 07/17/2018 0537   APPEARANCEUR HAZY (A) 07/17/2018 0537   LABSPEC 1.021 07/17/2018 0537   PHURINE 5.0 07/17/2018 0537   GLUCOSEU >=500 (A) 07/17/2018 0537   HGBUR LARGE (A) 07/17/2018 0537   BILIRUBINUR NEGATIVE 07/17/2018 0537   KETONESUR NEGATIVE 07/17/2018 0537   PROTEINUR >=300 (A)  07/17/2018 0537   UROBILINOGEN 0.2 10/02/2013 2108   NITRITE NEGATIVE 07/17/2018 0537   LEUKOCYTESUR NEGATIVE 07/17/2018 0537   Sepsis Labs Invalid input(s): PROCALCITONIN,  WBC,  LACTICIDVEN   Time coordinating discharge: 35 minutes  SIGNED:  Mercy Riding, MD  [facility] 07/20/2018, 2:15 PM Pager [phone]  If 7PM-7AM, please contact night-coverage [email or web address] Password [email or web address]

## 2018-07-20 NOTE — TOC Initial Note (Signed)
Transition of Care [facility]) - Initial/Assessment Note    Patient Details  Name: Deborah [name] MRN: [id number] Date of Birth: [birth year]  Transition of [facility]) CM/SW Contact:    Alberteen Sam, [place] Phone Number: [phone] 07/20/2018, 1:52 PM  Clinical Narrative:                  CSW consulted with patient regarding discharge planning. She reports she has been staying at [facility] for the past 10 months and is looking forward to returning. Patient requesting to be discharged as soon as possible, CSW notified of process and will keep patient updated on when this will occur. Patient reports no questions or concerns at this time as she is eager to return to [facility].   Expected Discharge Plan: Skilled Nursing Facility Barriers to Discharge: No Barriers Identified   Patient Goals and CMS Choice Patient states their goals for this hospitalization and ongoing recovery are:: to return to [place] CMS Medicare.gov Compare Post Acute Care list provided to:: Patient Choice offered to / list presented to : Patient  Expected Discharge Plan and Services Expected Discharge Plan: [place] Choice: [place] Living arrangements for the past 2 months: [place] Expected Discharge Date: 07/20/18                                    Prior Living Arrangements/Services Living arrangements for the past 2 months: [place] Lives with:: Self Patient language and need for interpreter reviewed:: Yes Do you feel safe going back to the place where you live?: Yes      Need for Family Participation in Patient Care: Yes (Comment) Care giver support system in place?: Yes (comment)   Criminal Activity/Legal Involvement Pertinent to Current Situation/Hospitalization: No - Comment as needed  Activities of Daily Living Home Assistive Devices/Equipment: Wheelchair ADL Screening (condition at time of  admission) Patient's cognitive ability adequate to safely complete daily activities?: Yes Is the patient deaf or have difficulty hearing?: No Does the patient have difficulty seeing, even when wearing glasses/contacts?: No Does the patient have difficulty concentrating, remembering, or making decisions?: No Patient able to express need for assistance with ADLs?: Yes Does the patient have difficulty dressing or bathing?: Yes Independently performs ADLs?: No Does the patient have difficulty walking or climbing stairs?: Yes Weakness of Legs: Both Weakness of Arms/Hands: None  Permission Sought/Granted Permission sought to share information with : Case Manager, Customer service manager Permission granted to share information with : Yes, Verbal Permission Granted     Permission granted to share info w AGENCY: SNFs        Emotional Assessment Appearance:: Appears stated age Attitude/Demeanor/Rapport: Gracious Affect (typically observed): Accepting Orientation: : Oriented to Self, Oriented to  Time, Oriented to Place, Oriented to Situation Alcohol / Substance Use: Not Applicable Psych Involvement: No (comment)  Admission diagnosis:  Gastroparesis [K31.84] Generalized abdominal pain [R10.84] Hyperglycemia [R73.9] AKI (acute kidney injury) ([place]) [N17.9] Patient Active Problem List   Diagnosis Date Noted  . Vomiting 07/18/2018  . Abdominal pain 07/17/2018  . Hyperkalemia 07/17/2018  . Cardiac arrest ([place]) 11/29/2017  . Pelvic mass 11/29/2017  . Leukocytosis 11/29/2017  . Anxiety 11/29/2017  . Allergic reaction to contrast media, initial encounter 11/28/2017  . Palliative care encounter   . Back pain 03/19/2017  . Stroke (cerebrum) ([place]) 03/19/2017  .  GERD (gastroesophageal reflux disease) 03/19/2017  . Depression 03/19/2017  . Obesity   . Urinary tract infection 08/16/2016  . Normocytic normochromic anemia 08/16/2016  . Gastroparesis 08/16/2016  . Intractable nausea  and vomiting 06/17/2016  . Diabetic gastroparesis ([place]) 06/05/2016  . Gout 06/05/2016  . AKI (acute kidney injury) ([place]) 12/06/2015  . Chest pain 09/26/2015  . Hypokalemia 09/26/2015  . Hypomagnesemia 09/26/2015  . Nausea and vomiting 08/20/2015  . Gout flare 05/27/2015  . Lower abdominal pain 05/26/2015  . DKA (diabetic ketoacidoses) ([place]) 05/25/2015  . Uncontrolled type 2 diabetes mellitus with diabetic neuropathy, with long-term current use of insulin ([place]) 05/25/2015  . Dyslipidemia associated with type 2 diabetes mellitus ([place]) 05/25/2015  . CKD (chronic kidney disease), stage II 05/25/2015  . Essential hypertension, benign 09/28/2013   PCP:  Patient, No Pcp Per Pharmacy:  No Pharmacies Listed    Social Determinants of Health (SDOH) Interventions    Readmission Risk Interventions No flowsheet data found.

## 2018-07-20 NOTE — Progress Notes (Signed)
Inpatient Diabetes Program Recommendations  AACE/ADA: New Consensus Statement on Inpatient Glycemic Control (2015)  Target Ranges:  Prepandial:   less than 140 mg/dL      Peak postprandial:   less than 180 mg/dL (1-2 hours)      Critically ill patients:  140 - 180 mg/dL   Results for Deborah [name], Deborah [name] (MRN [id number]) as of 07/20/2018 09:20  Ref. Range 07/18/2018 06:31 07/18/2018 11:16 07/18/2018 16:00 07/18/2018 21:14  Glucose-Capillary Latest Ref Range: 70 - 99 mg/dL 226 (H)  5 units NOVOLOG  226 (H)  5 units NOVOLOG  298 (H)  8 units NOVOLOG  258 (H)    48 units LANTUS   Results for Deborah [name], Deborah [name] (MRN [id number]) as of 07/20/2018 09:20  Ref. Range 07/19/2018 06:03 07/19/2018 10:40 07/19/2018 12:09 07/19/2018 14:32 07/19/2018 16:08 07/19/2018 21:12  Glucose-Capillary Latest Ref Range: 70 - 99 mg/dL 181 (H)  3 units NOVOLOG +  48 units LANTUS given at 8:33am  238 (H) 223 (H)  8 units NOVOLOG  302 (H) 271 (H)  8 units NOVOLOG  202 (H)   Results for Deborah [name], Deborah [name] (MRN [id number]) as of 07/20/2018 09:20  Ref. Range 07/20/2018 06:39  Glucose-Capillary Latest Ref Range: 70 - 99 mg/dL 227 (H)  5 units NOVOLOG      Home DM Meds: Lantus 40 units QHS       Humalog 10 units TID  Current Orders: Lantus 48 units Daily      Novolog Moderate Correction Scale/ SSI (0-15 units) TID AC      Note 48 units Lantus given in error yesterday AM (pt had received 48 units the evening prior at bedtime on 05/12).  Timing of Lantus changed to Daily at 10am--Will get next dose at 10am today.  CBGs still running >200 mg/dl consistently.    MD- Please consider the following in-hospital insulin adjustments:  1. Increase Lantus slightly to 50 units Daily   2. Start Novolog Meal Coverage: Novolog 5 units TID with meals (50% home dose)  (Please add the following Hold Parameters: Hold if pt eats <50% of meal, Hold if pt NPO)      --Will follow patient during  hospitalization--  Wyn Quaker RN, MSN, CDE Diabetes Coordinator Inpatient Glycemic Control Team Team Pager: [phone] (8a-5p)

## 2018-07-20 NOTE — Progress Notes (Signed)
Pt asked to wait for medication wants to sleep for now

## 2018-07-20 NOTE — NC FL2 (Signed)
[place] MEDICAID FL2 LEVEL OF CARE SCREENING TOOL     IDENTIFICATION  Patient Name: Deborah [name] Birthdate: 04/23/1965 Sex: female Admission Date (Current Location): 07/17/2018  [facility] and [place] Number:  Herbalist and Address:  [facility], [place] [address], [place], [place] [address]      Provider Number: [id number]  Attending Physician Name and Address:  Mercy Riding, MD  Relative Name and Phone Number:  Vicente Males (mother) [phone]    Current Level of Care: Hospital Recommended Level of Care: [place] Prior Approval Number:    Date Approved/Denied: 03/22/17 PASRR Number: [id number]  Discharge Plan: SNF    Current Diagnoses: Patient Active Problem List   Diagnosis Date Noted  . Vomiting 07/18/2018  . Abdominal pain 07/17/2018  . Hyperkalemia 07/17/2018  . Cardiac arrest ([place]) 11/29/2017  . Pelvic mass 11/29/2017  . Leukocytosis 11/29/2017  . Anxiety 11/29/2017  . Allergic reaction to contrast media, initial encounter 11/28/2017  . Palliative care encounter   . Back pain 03/19/2017  . Stroke (cerebrum) ([place]) 03/19/2017  . GERD (gastroesophageal reflux disease) 03/19/2017  . Depression 03/19/2017  . Obesity   . Urinary tract infection 08/16/2016  . Normocytic normochromic anemia 08/16/2016  . Gastroparesis 08/16/2016  . Intractable nausea and vomiting 06/17/2016  . Diabetic gastroparesis ([place]) 06/05/2016  . Gout 06/05/2016  . AKI (acute kidney injury) ([place]) 12/06/2015  . Chest pain 09/26/2015  . Hypokalemia 09/26/2015  . Hypomagnesemia 09/26/2015  . Nausea and vomiting 08/20/2015  . Gout flare 05/27/2015  . Lower abdominal pain 05/26/2015  . DKA (diabetic ketoacidoses) ([place]) 05/25/2015  . Uncontrolled type 2 diabetes mellitus with diabetic neuropathy, with long-term current use of insulin ([place]) 05/25/2015  . Dyslipidemia associated with type 2 diabetes mellitus ([place]) 05/25/2015  . CKD (chronic  kidney disease), stage II 05/25/2015  . Essential hypertension, benign 09/28/2013    Orientation RESPIRATION BLADDER Height & Weight     Self, Time, Situation, Place  Normal Continent Weight: (!) 343 lb 14.4 oz (156 kg) Height:  5\' 6"  (167.6 cm)  BEHAVIORAL SYMPTOMS/MOOD NEUROLOGICAL BOWEL NUTRITION STATUS      Continent Diet(see discharge summary)  AMBULATORY STATUS COMMUNICATION OF NEEDS Skin   Limited Assist Verbally Normal                       Personal Care Assistance Level of Assistance  Bathing, Feeding, Dressing, Total care Bathing Assistance: Limited assistance Feeding assistance: Independent Dressing Assistance: Limited assistance Total Care Assistance: Limited assistance   Functional Limitations Info  Sight, Hearing, Speech Sight Info: Adequate Hearing Info: Adequate Speech Info: Adequate    SPECIAL CARE FACTORS FREQUENCY  PT (By licensed PT), OT (By licensed OT)     PT Frequency: min 5x weekly OT Frequency: min 5x weekly            Contractures Contractures Info: Not present    Additional Factors Info  Code Status, Allergies Code Status Info: full Allergies Info: Contrast Media (iodinated diagnostic agents), diazepam, isovue (iopamidol), Lisinopril, penicillins, acetaminophen, tolmetin, aspirin, carrots, ketchup, nsaids, tramadol           Current Medications (07/20/2018):  This is the current hospital active medication list Current Facility-Administered Medications  Medication Dose Route Frequency Provider Last Rate Last Dose  . allopurinol (ZYLOPRIM) tablet 100 mg  100 mg Oral Daily Vann, Jessica U, DO   100 mg at 07/20/18 1044  . ALPRAZolam (XANAX) tablet 0.25  mg  0.25 mg Oral BID PRN Eulogio Bear U, DO   0.25 mg at 07/19/18 2209  . aspirin EC tablet 81 mg  81 mg Oral Daily Eulogio Bear U, DO   81 mg at 07/20/18 1044  . atorvastatin (LIPITOR) tablet 10 mg  10 mg Oral Daily Vann, Jessica U, DO   10 mg at 07/20/18 1045  . capsaicin (ZOSTRIX)  0.025 % cream   Topical BID Eulogio Bear U, DO      . DULoxetine (CYMBALTA) DR capsule 30 mg  30 mg Oral BID Eulogio Bear U, DO   30 mg at 07/20/18 1044  . enoxaparin (LOVENOX) injection 75 mg  75 mg Subcutaneous QHS Eulogio Bear U, DO   75 mg at 07/19/18 2207  . insulin aspart (novoLOG) injection 0-15 Units  0-15 Units Subcutaneous TID WC Shela Leff, MD   5 Units at 07/20/18 0730  . insulin aspart (novoLOG) injection 10 Units  10 Units Subcutaneous Once Shela Leff, MD      . insulin glargine (LANTUS) injection 48 Units  48 Units Subcutaneous Daily Wendee Beavers T, MD   48 Units at 07/20/18 1051  . levofloxacin (LEVAQUIN) tablet 750 mg  750 mg Oral Daily Wendee Beavers T, MD   750 mg at 07/20/18 1044  . Melatonin TABS 6 mg  6 mg Oral QHS Vann, Jessica U, DO   6 mg at 07/19/18 2208  . methocarbamol (ROBAXIN) tablet 500 mg  500 mg Oral BID Eulogio Bear U, DO   500 mg at 07/20/18 1044  . metoCLOPramide (REGLAN) injection 10 mg  10 mg Intravenous Q6H Shela Leff, MD   10 mg at 07/20/18 0647  . metoprolol succinate (TOPROL-XL) 24 hr tablet 25 mg  25 mg Oral Daily Vann, Jessica U, DO   25 mg at 07/20/18 1044  . pantoprazole (PROTONIX) EC tablet 40 mg  40 mg Oral Daily Shela Leff, MD   40 mg at 07/20/18 1044     Discharge Medications: Please see discharge summary for a list of discharge medications.  Relevant Imaging Results:  Relevant Lab Results:   Additional Information SSN: [id number]  Alberteen Sam, LCSW

## 2018-07-20 NOTE — Evaluation (Signed)
Physical Therapy Evaluation Patient Details Name: Deborah [name] MRN: [id number] DOB: 07-Mar-1966 Today's Date: 07/20/2018   History of Present Illness  Patient is a 53 y/o female presenting to the ED with primary complaints of vomiting and chest pain (from [facility]). Past medical history significant of type 2 diabetes, gastroparesis, GERD, chronic pain, gout, hypertension, hyperlipidemia, CVA.     Clinical Impression  Patient admitted with the above listed diagnosis. Patient reports she is a prior resident of SNF [facility] where she was receiving therapy services. Patient today requiring Min A for transfers and short distance ambulation in room, with patient reporting primary limiting factor of knee pain. Patient to benefit from return to SNF at discharge for continued therapy. PT to follow acutely.     Follow Up Recommendations SNF;Supervision/Assistance - 24 hour    Equipment Recommendations  None recommended by PT    Recommendations for Other Services       Precautions / Restrictions Precautions Precautions: Fall Restrictions Weight Bearing Restrictions: No      Mobility  Bed Mobility               General bed mobility comments: sitting EOB upon PT arrival  Transfers Overall transfer level: Needs assistance Equipment used: Rolling walker (2 wheeled) Transfers: Sit to/from [facility] Sit to Stand: Min assist Stand pivot transfers: Min assist       General transfer comment: Min A to power up at bedside; cueing for safety  Ambulation/Gait Ambulation/Gait assistance: Min assist Gait Distance (Feet): 10 Feet Assistive device: Rolling walker (2 wheeled) Gait Pattern/deviations: Step-to pattern;Step-through pattern;Decreased stride length Gait velocity: decreased   General Gait Details: reports pain at knees with weight bearing; cueing for safety and sequencing throughout  Stairs            Wheelchair Mobility    Modified  Rankin (Stroke Patients Only)       Balance Overall balance assessment: Needs assistance Sitting-balance support: No upper extremity supported;Feet supported Sitting balance-Leahy Scale: Good     Standing balance support: Bilateral upper extremity supported;During functional activity Standing balance-Leahy Scale: Poor Standing balance comment: reliant on UE support                             Pertinent Vitals/Pain Pain Assessment: Faces Faces Pain Scale: Hurts even more Pain Location: knees with weight bearing Pain Descriptors / Indicators: Aching;Discomfort;Grimacing Pain Intervention(s): Limited activity within patient's tolerance;Monitored during session;Repositioned    Home Living Family/patient expects to be discharged to:: Skilled nursing facility                 Additional Comments: Deborah [name]; was participating in rehab services    Prior Function Level of Independence: Needs assistance   Gait / Transfers Assistance Needed: short distances with RW           Hand Dominance        Extremity/Trunk Assessment   Upper Extremity Assessment Upper Extremity Assessment: Defer to OT evaluation    Lower Extremity Assessment Lower Extremity Assessment: Generalized weakness    Cervical / Trunk Assessment Cervical / Trunk Assessment: Normal  Communication   Communication: No difficulties  Cognition Arousal/Alertness: Awake/alert Behavior During Therapy: WFL for tasks assessed/performed Overall Cognitive Status: Within Functional Limits for tasks assessed  General Comments      Exercises     Assessment/Plan    PT Assessment Patient needs continued PT services  PT Problem List Decreased strength;Decreased activity tolerance;Decreased balance;Decreased mobility;Decreased safety awareness;Decreased knowledge of use of DME       PT Treatment Interventions DME instruction;Gait  training;Stair training;Functional mobility training;Therapeutic activities;Therapeutic exercise;Balance training;Patient/family education    PT Goals (Current goals can be found in the Care Plan section)  Acute Rehab PT Goals Patient Stated Goal: return to rehab PT Goal Formulation: With patient Time For Goal Achievement: 08/03/18 Potential to Achieve Goals: Good    Frequency Min 2X/week   Barriers to discharge        Co-evaluation               AM-PAC PT "6 Clicks" Mobility  Outcome Measure Help needed turning from your back to your side while in a flat bed without using bedrails?: A Little Help needed moving from lying on your back to sitting on the side of a flat bed without using bedrails?: A Little Help needed moving to and from a bed to a chair (including a wheelchair)?: A Little Help needed standing up from a chair using your arms (e.g., wheelchair or bedside chair)?: A Little Help needed to walk in hospital room?: A Little Help needed climbing 3-5 steps with a railing? : Total 6 Click Score: 16    End of Session   Activity Tolerance: Patient tolerated treatment well Patient left: in chair;with call bell/phone within reach;with nursing/sitter in room(at sink for bath) Nurse Communication: Mobility status PT Visit Diagnosis: Unsteadiness on feet (R26.81);Other abnormalities of gait and mobility (R26.89);Muscle weakness (generalized) (M62.81)    Time: 1051-1101 PT Time Calculation (min) (ACUTE ONLY): 10 min   Charges:   PT Evaluation $PT Eval Moderate Complexity: 1 Mod          Lanney Gins, PT, DPT Supplemental Physical Therapist 07/20/18 11:09 AM Pager: [phone] Office: [phone]

## 2018-07-27 IMAGING — CR DG CHEST 2V
2 series · 2 of 2 positions shown · non-contrast
Comparison: Radiographs June 05, 2016.

CLINICAL DATA: Chest pain.

EXAM:
CHEST  2 VIEW

[w chest pa]
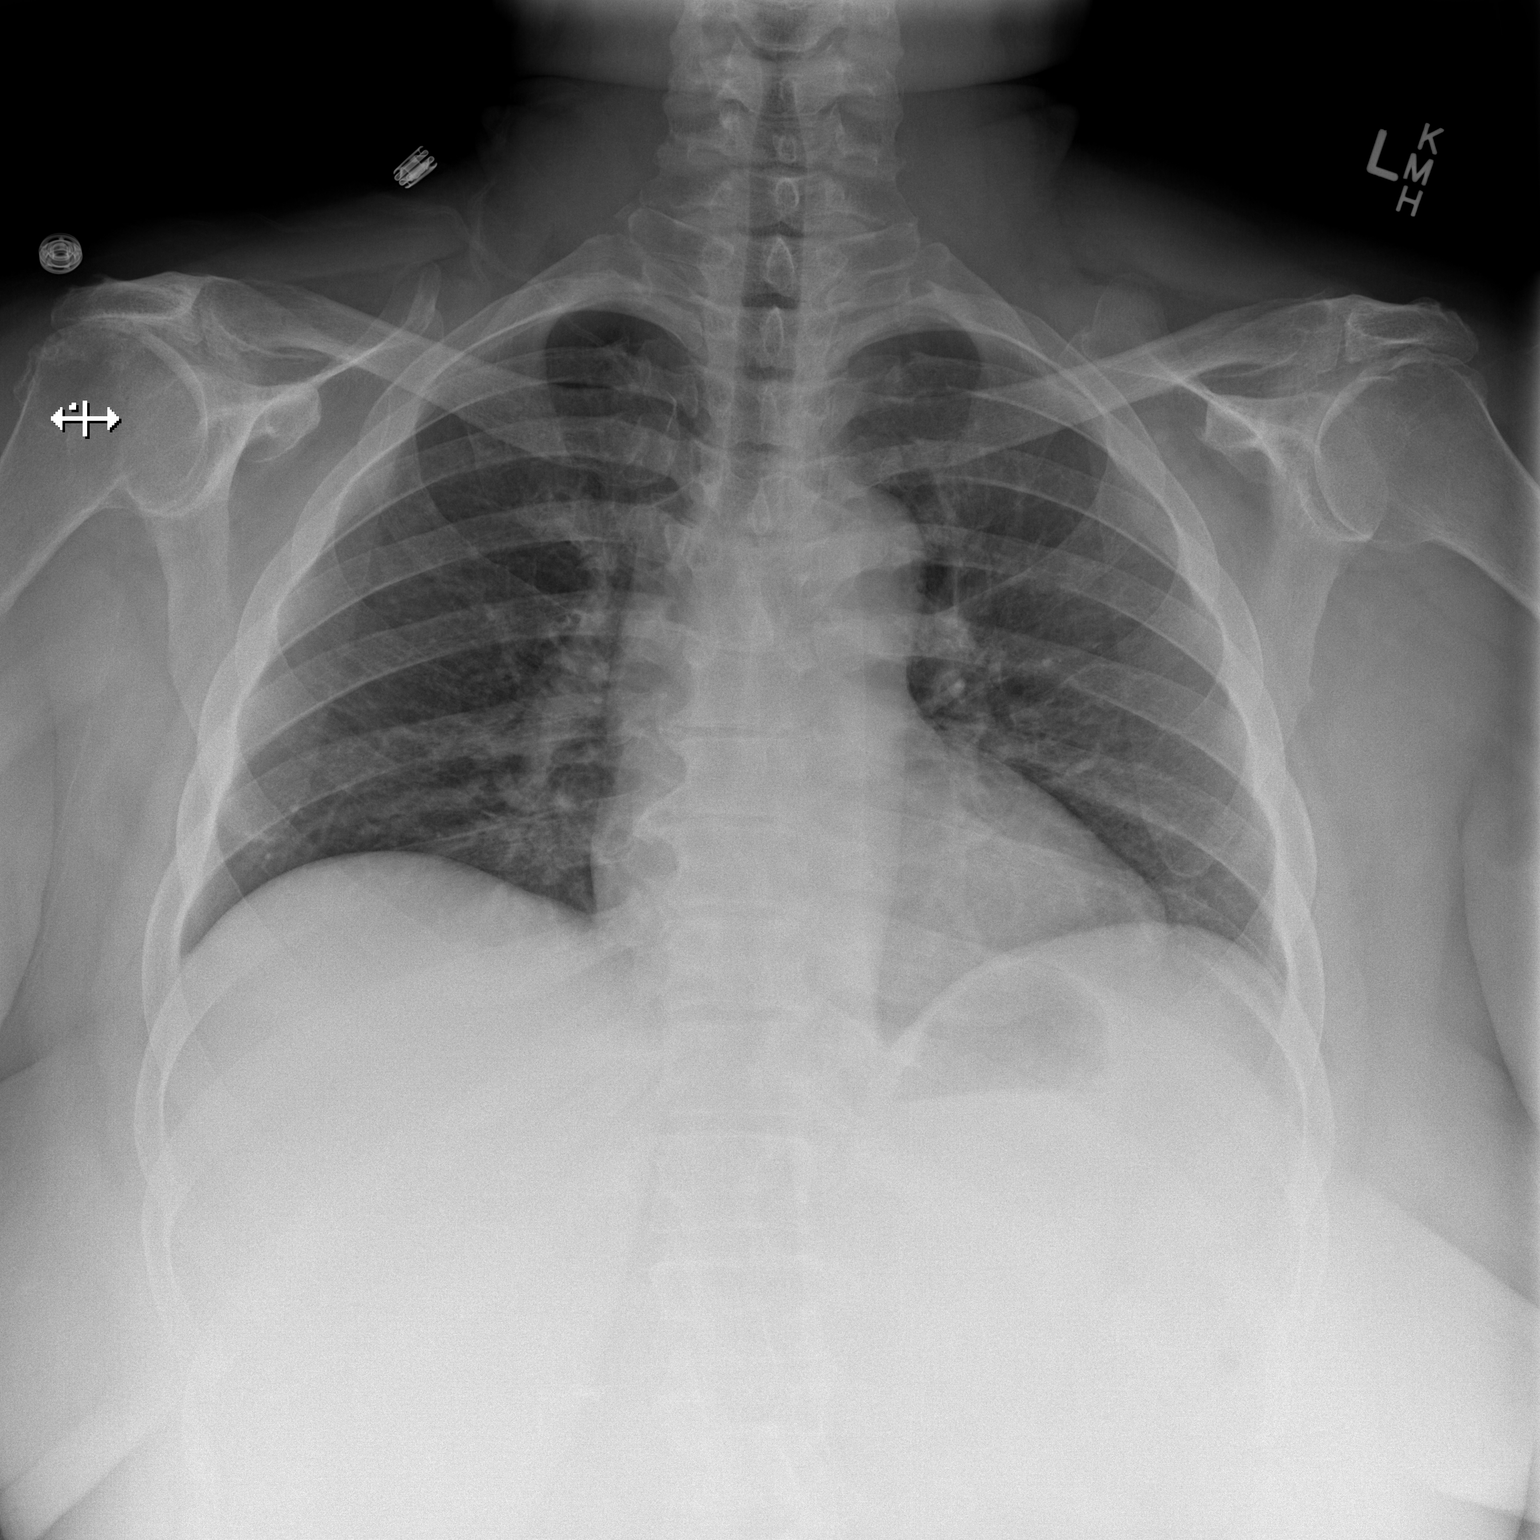

[w chest lat]
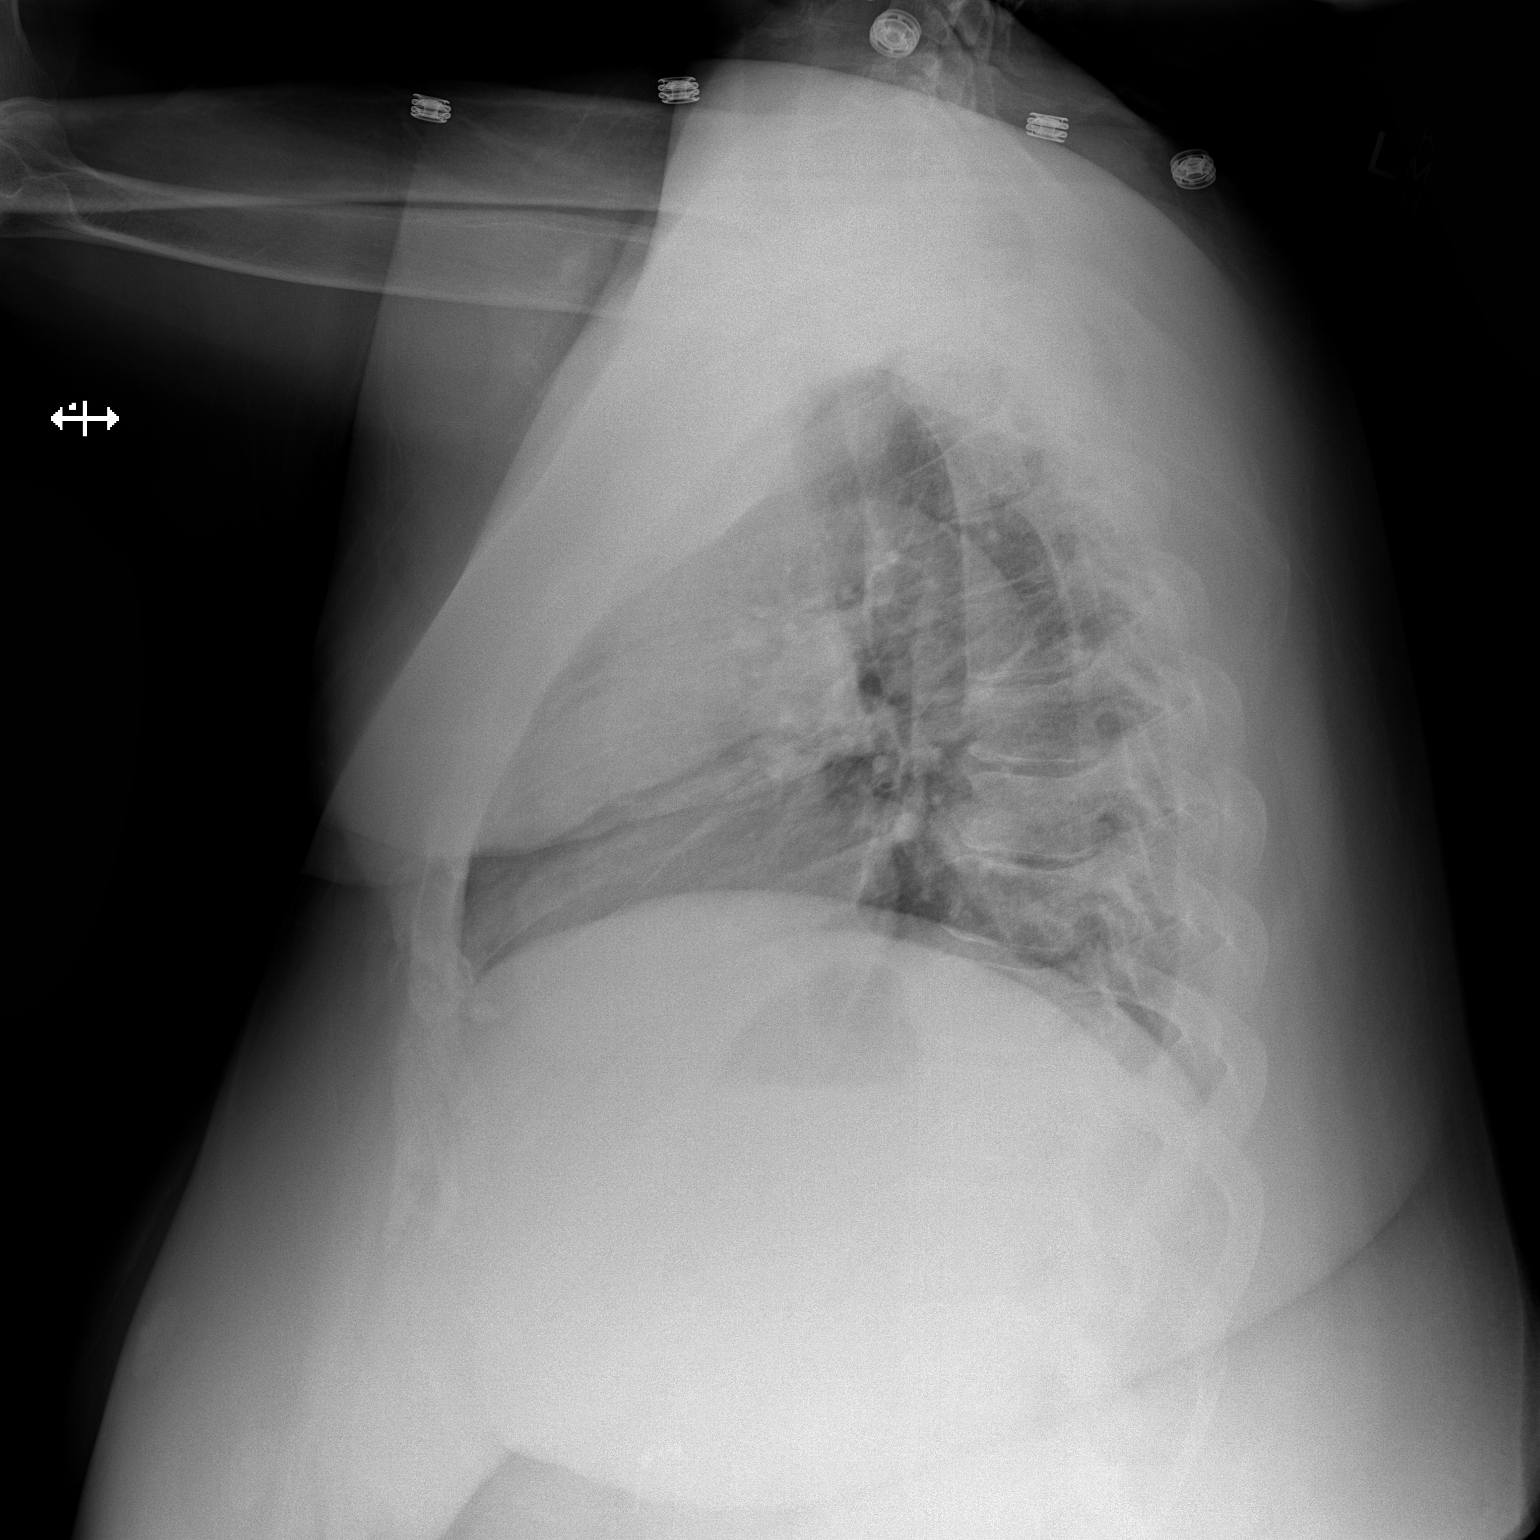

[2 of 2 positions shown; findings below may reference images not displayed]

FINDINGS: The heart size and mediastinal contours are within normal limits.
Both lungs are clear. No pneumothorax or pleural effusion is noted.
The visualized skeletal structures are unremarkable.
IMPRESSION: No active cardiopulmonary disease.

## 2018-07-28 IMAGING — DX DG ABD PORTABLE 1V
2 series · 2 of 2 positions shown · non-contrast
Comparison: 04/23/2016

CLINICAL DATA: Nausea, vomiting

EXAM:
PORTABLE ABDOMEN - 1 VIEW

[abdomen kub (1 of 2)]
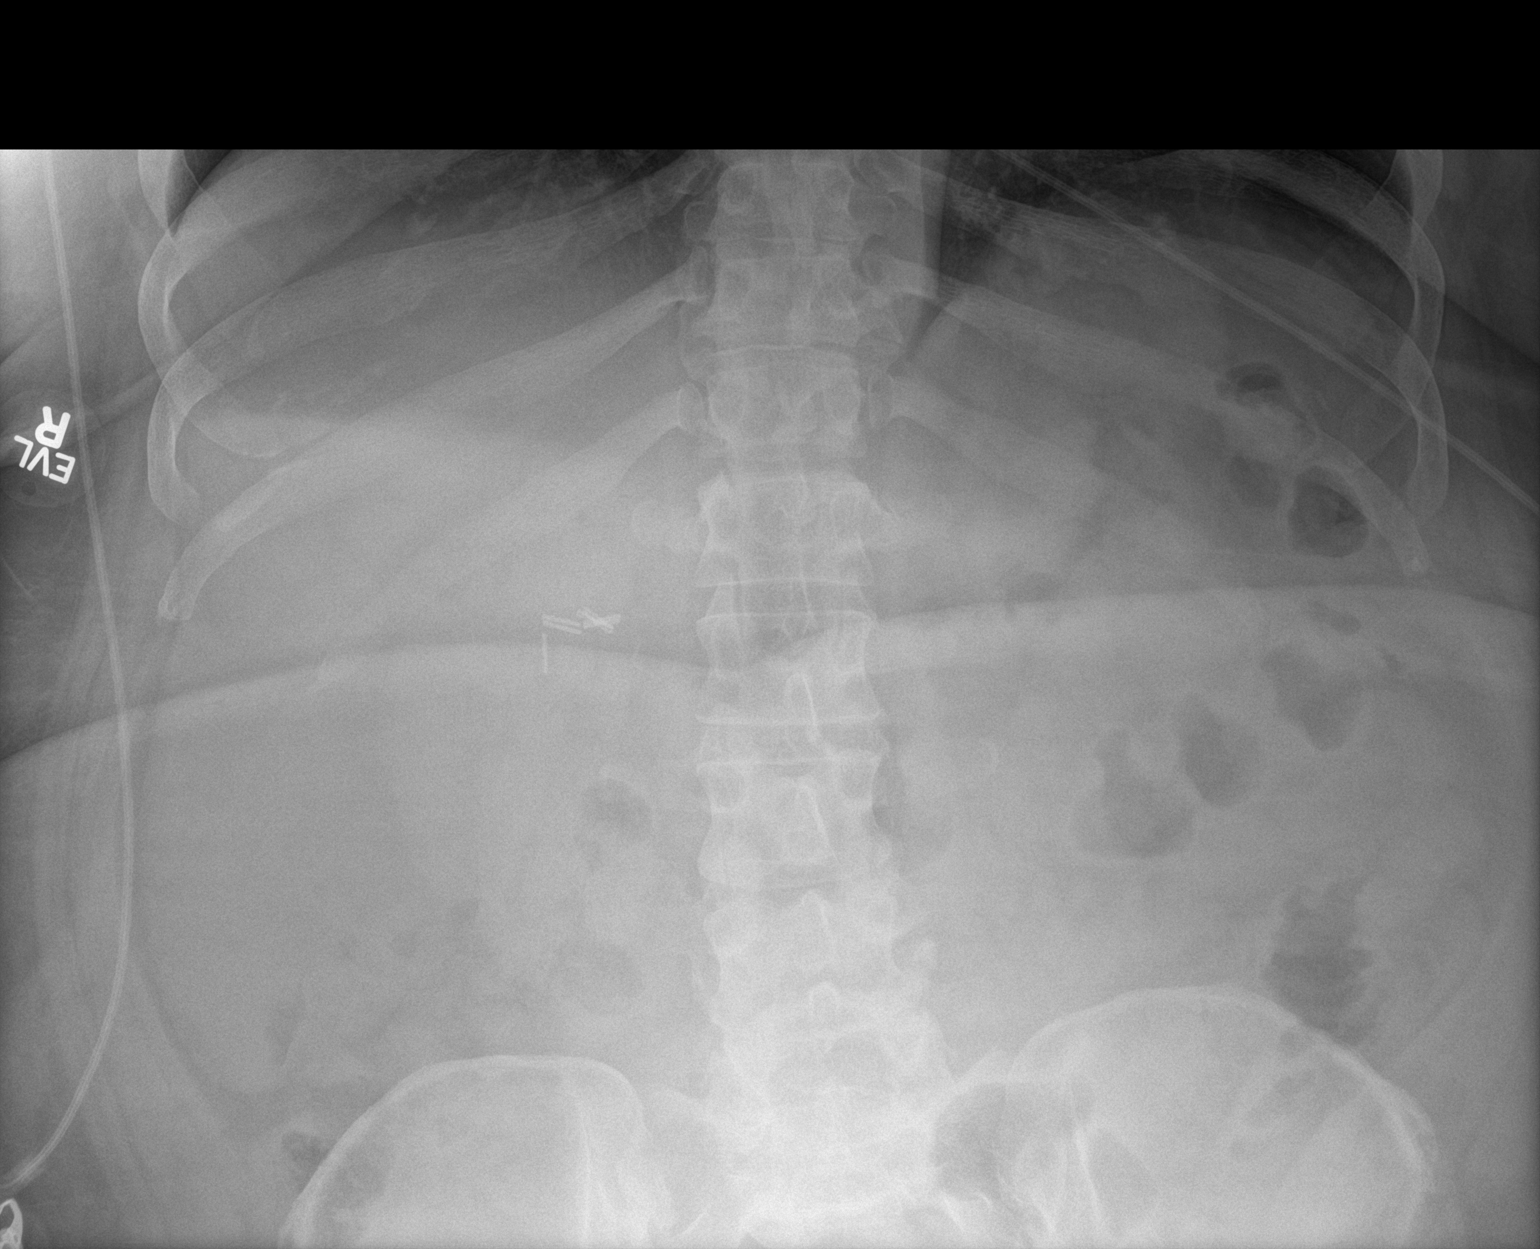

[abdomen kub (2 of 2)]
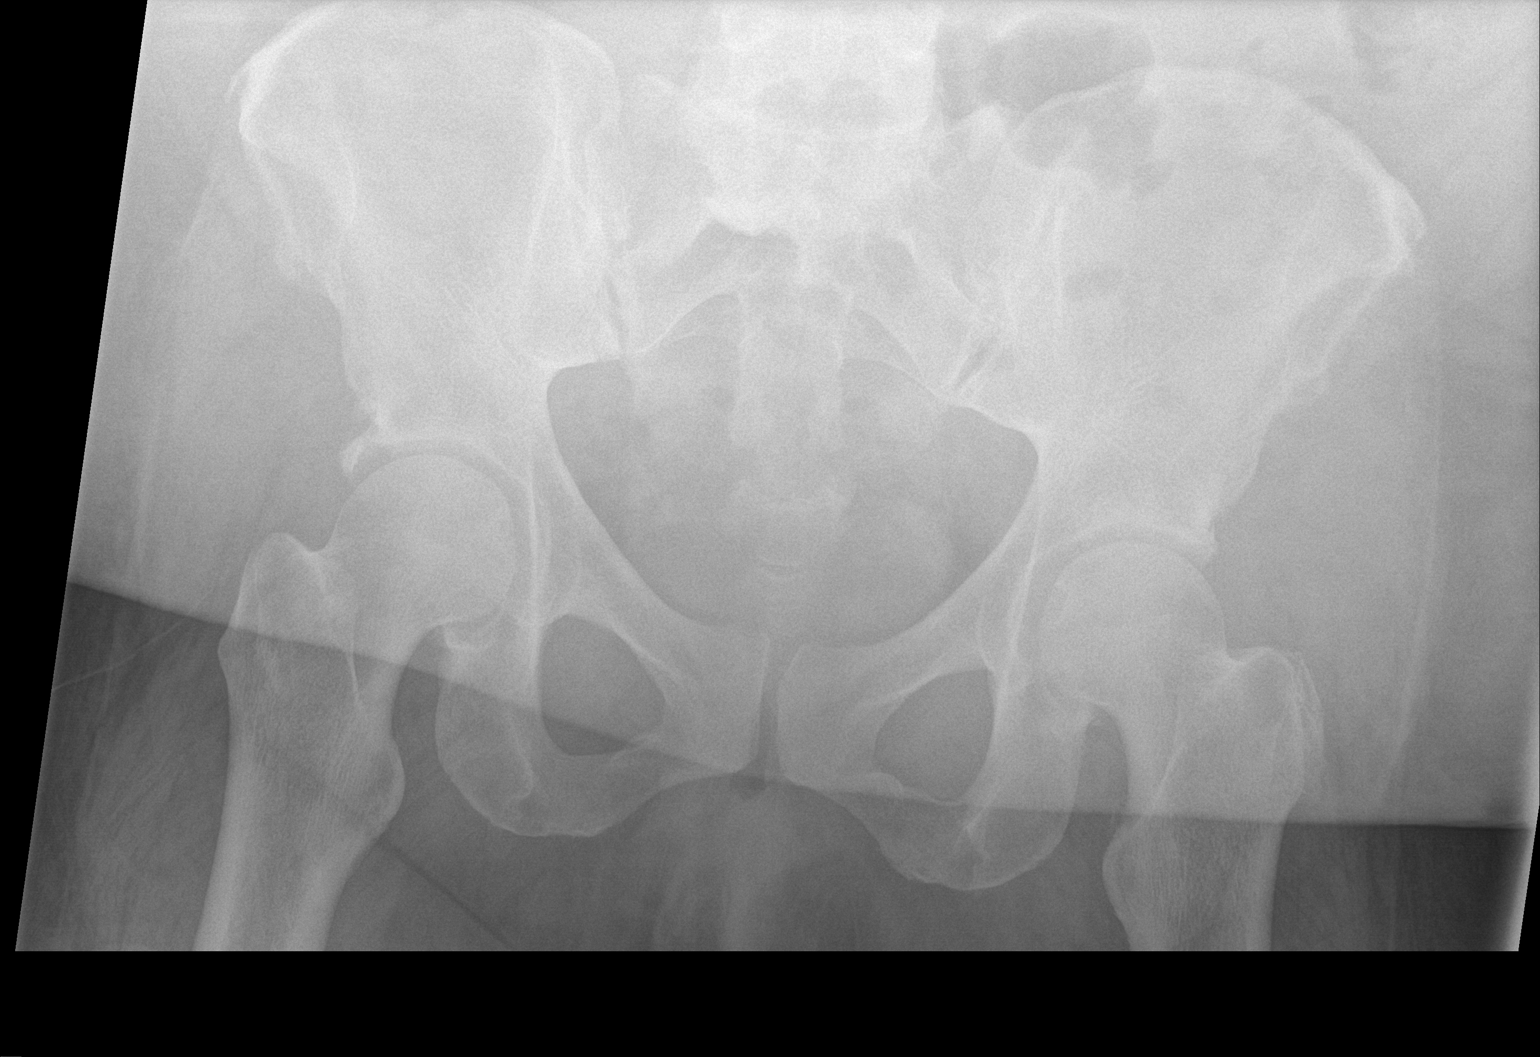

[2 of 2 positions shown; findings below may reference images not displayed]

FINDINGS: Prior cholecystectomy. There is a non obstructive bowel gas pattern.
No supine evidence of free air. No organomegaly or suspicious
calcification. No acute bony abnormality.
IMPRESSION: No acute findings.

## 2018-08-13 IMAGING — CT CT ABD-PELV W/ CM
2 of 5 series · 17 of 46 positions shown, 19 images · IV contrast (APPLIED)
Comparison: Multiple prior CT examinations dating back to
02/20/2015

CLINICAL DATA: Abdominal pain tonight.

EXAM:
CT ABDOMEN AND PELVIS WITH CONTRAST
TECHNIQUE: Multidetector CT imaging of the abdomen and pelvis was performed
using the standard protocol following bolus administration of
intravenous contrast.
CONTRAST:  100mL [id number] IOPAMIDOL ([id number]) INJECTION 61%

[Series 3: abd/ pelvis 5.0 i30f 2 · axial · 0.79mm/px · z∈[+740,+1195]mm · 14 of 103 slices shown, 16 images]
[im 6/103  soft-tissue]
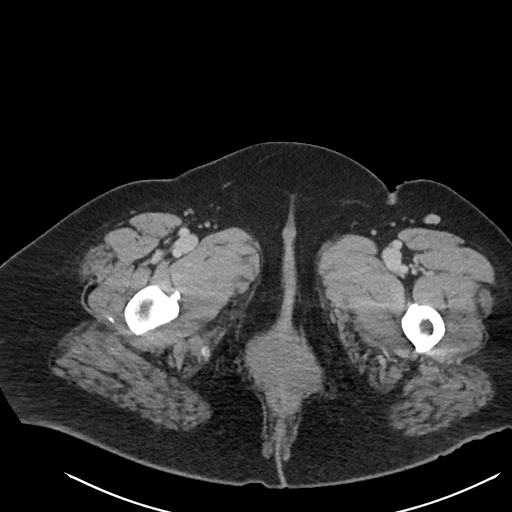
[im 6/103  bone]
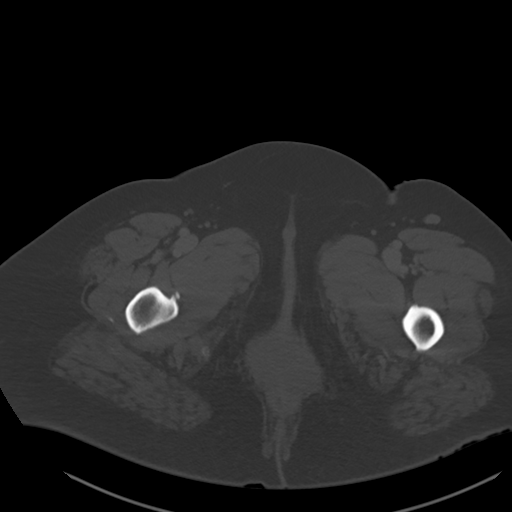
[im 16/103  soft-tissue]
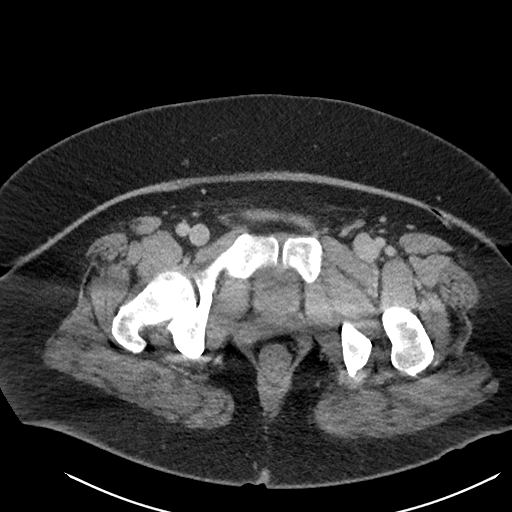
[im 21/103  soft-tissue]
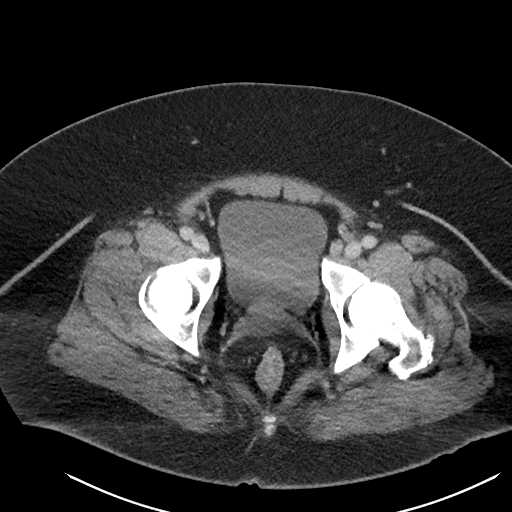
[im 26/103  soft-tissue]
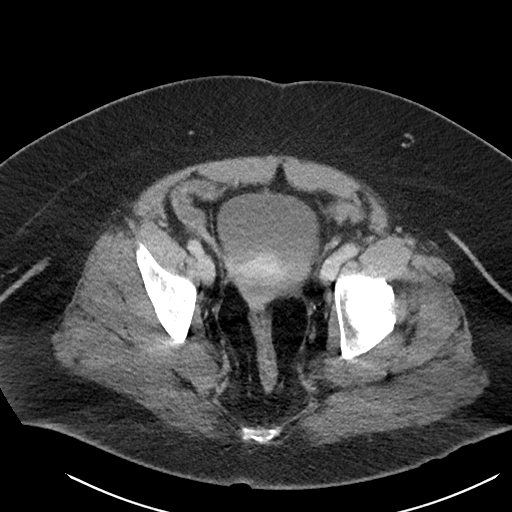
[im 36/103  soft-tissue]
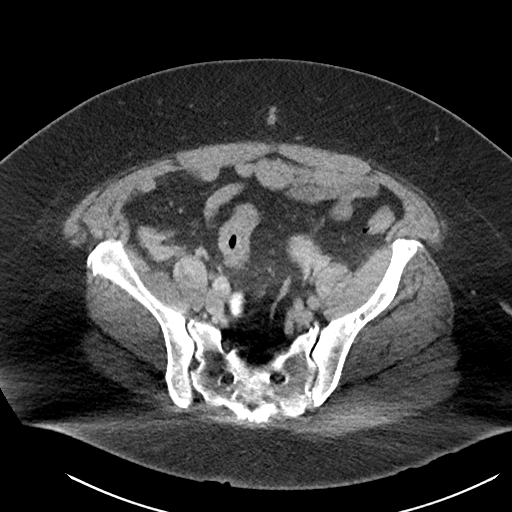
[im 41/103  soft-tissue]
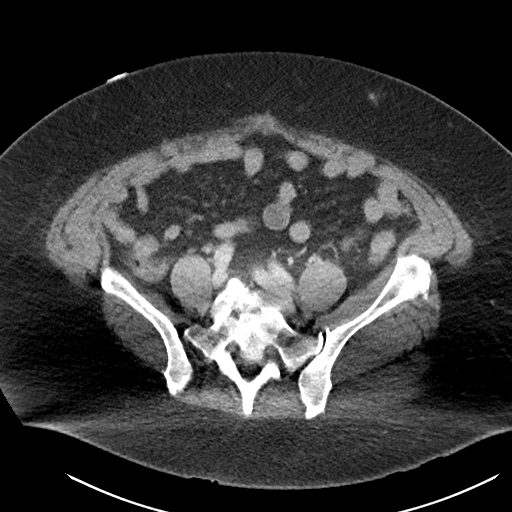
[im 46/103  soft-tissue]
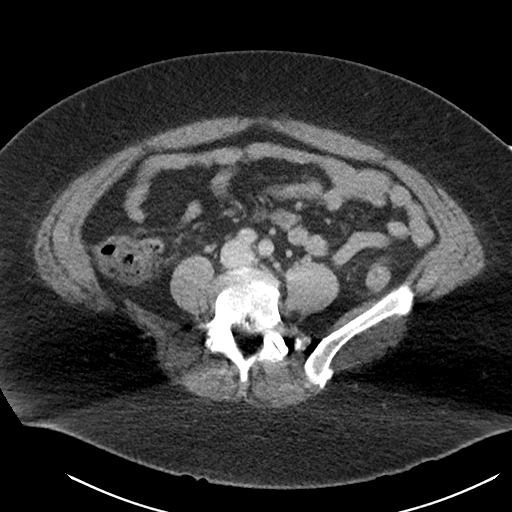
[im 57/103  soft-tissue]
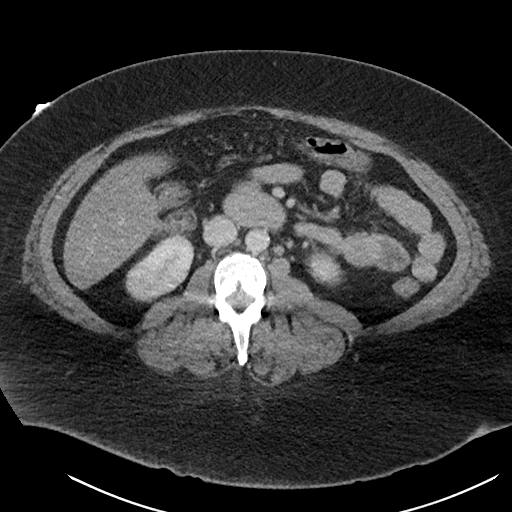
[im 62/103  soft-tissue]
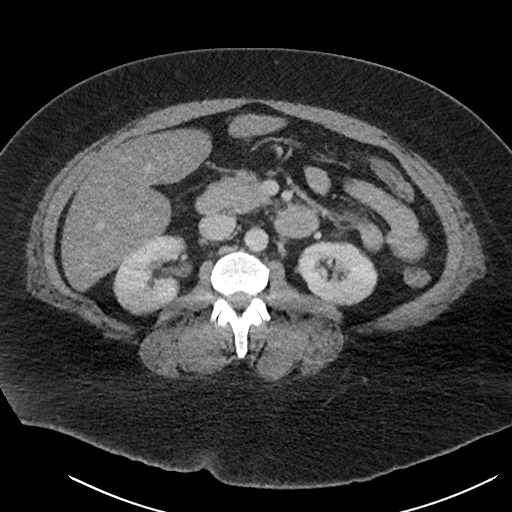
[im 62/103  bone]
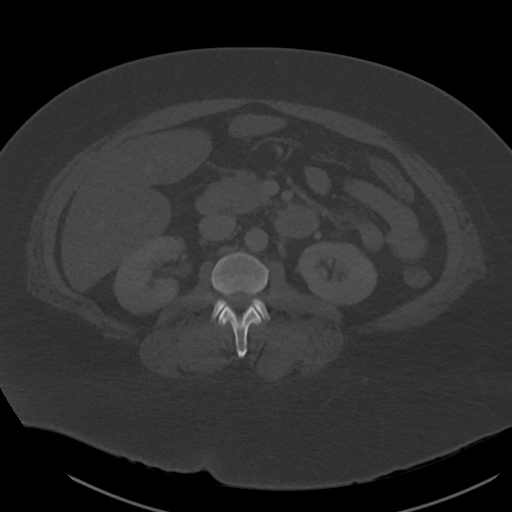
[im 67/103  soft-tissue]
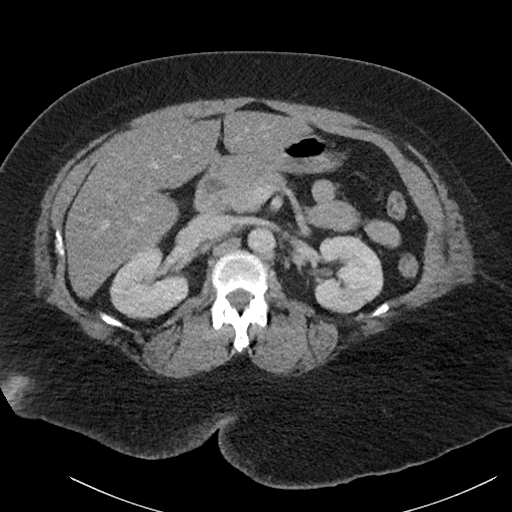
[im 77/103  soft-tissue]
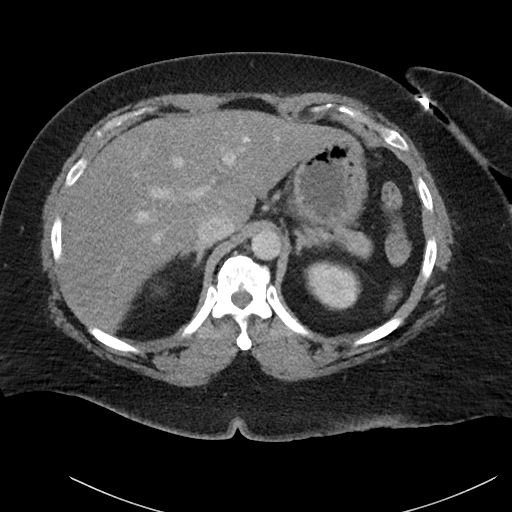
[im 82/103  soft-tissue]
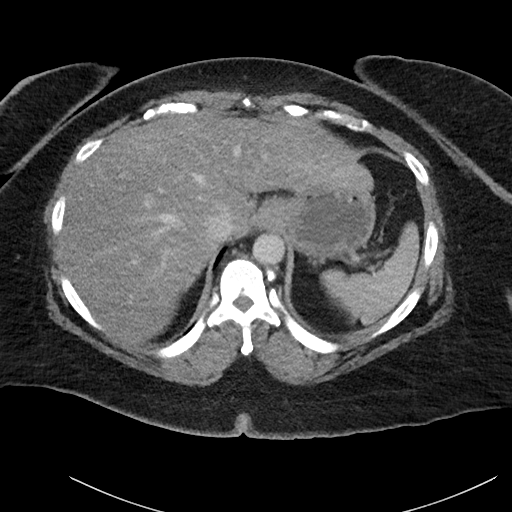
[im 87/103  soft-tissue]
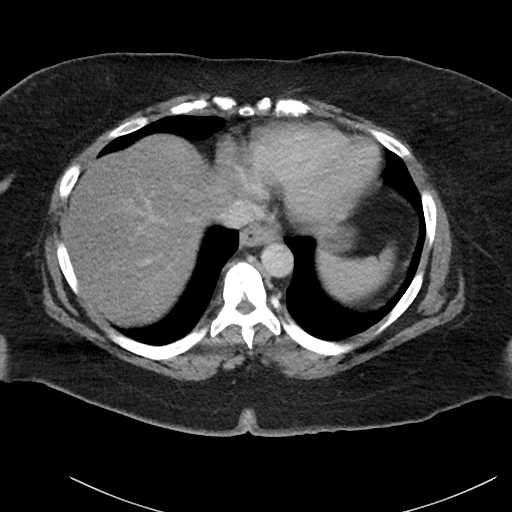
[im 97/103  soft-tissue]
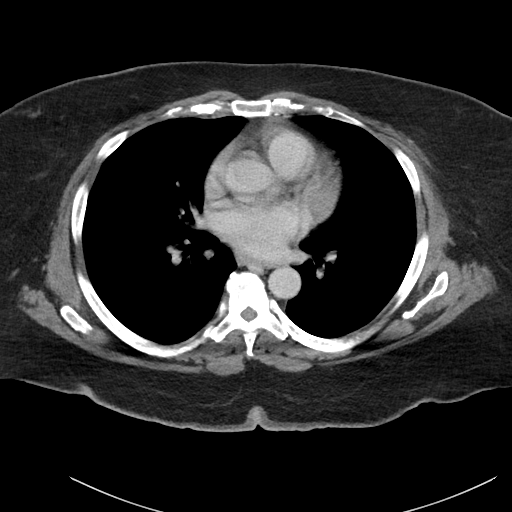

[Series 6: coronal soft tissue · coronal · 0.92mm/px · 3 of 97 slices shown]
[im 33/97  soft-tissue]
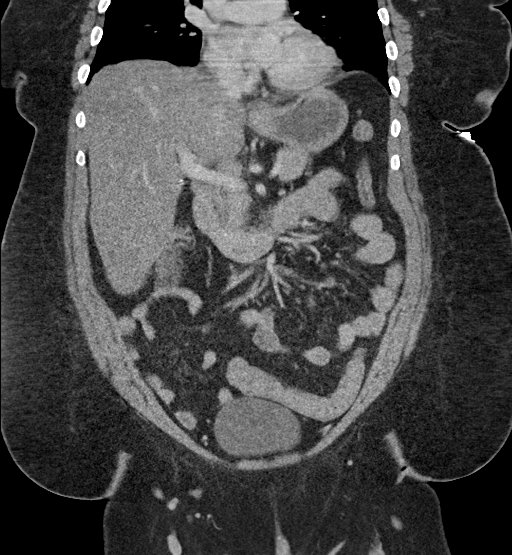
[im 43/97  soft-tissue]
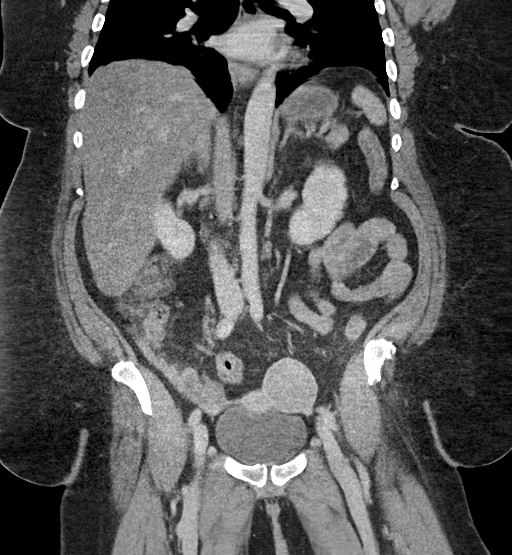
[im 54/97  soft-tissue]
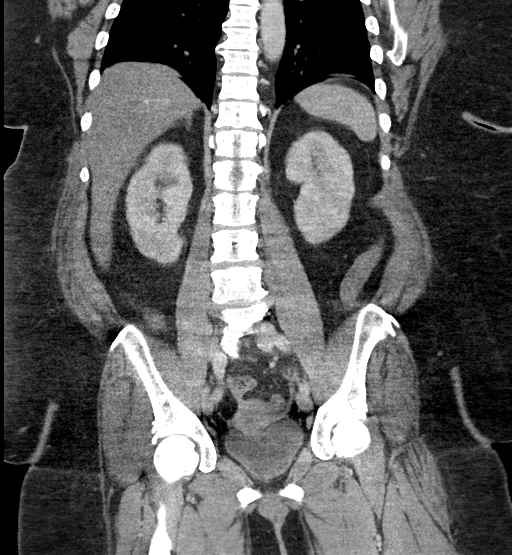

[17 of 46 positions shown; findings below may reference images not displayed]

FINDINGS: Lower chest: No acute abnormality.

Hepatobiliary: There is mild fatty infiltration of the liver without
focal lesion. There is cholecystectomy. Bile ducts are normal.

Pancreas: Unremarkable. No pancreatic ductal dilatation or
surrounding inflammatory changes.

Spleen: Normal in size without focal abnormality.

Adrenals/Urinary Tract: Adrenal glands are unremarkable. Kidneys are
normal, without renal calculi, focal lesion, or hydronephrosis.
Bladder is unremarkable.

Stomach/Bowel: Stomach is within normal limits. Appendix appears
normal. No evidence of bowel wall thickening, distention, or
inflammatory changes.

Vascular/Lymphatic: No significant vascular findings are present. No
enlarged abdominal or pelvic lymph nodes.

Reproductive: Unchanged 5 cm mass at the left lateral aspect of the
uterus, probably an exophytic fibroid.

Other: No focal inflammation.  No ascites.

Musculoskeletal: No significant skeletal lesion.
IMPRESSION: Mild hepatic steatosis. Unchanged 5 cm mass of the left lateral
aspect of the uterus, most likely an exophytic fibroid. No focal
inflammation. No ascites.

## 2018-08-13 IMAGING — CR DG CHEST 2V
2 series · 2 of 2 positions shown · non-contrast
Comparison: None.

CLINICAL DATA: Chest pain

EXAM:
CHEST  2 VIEW

[chest lat]
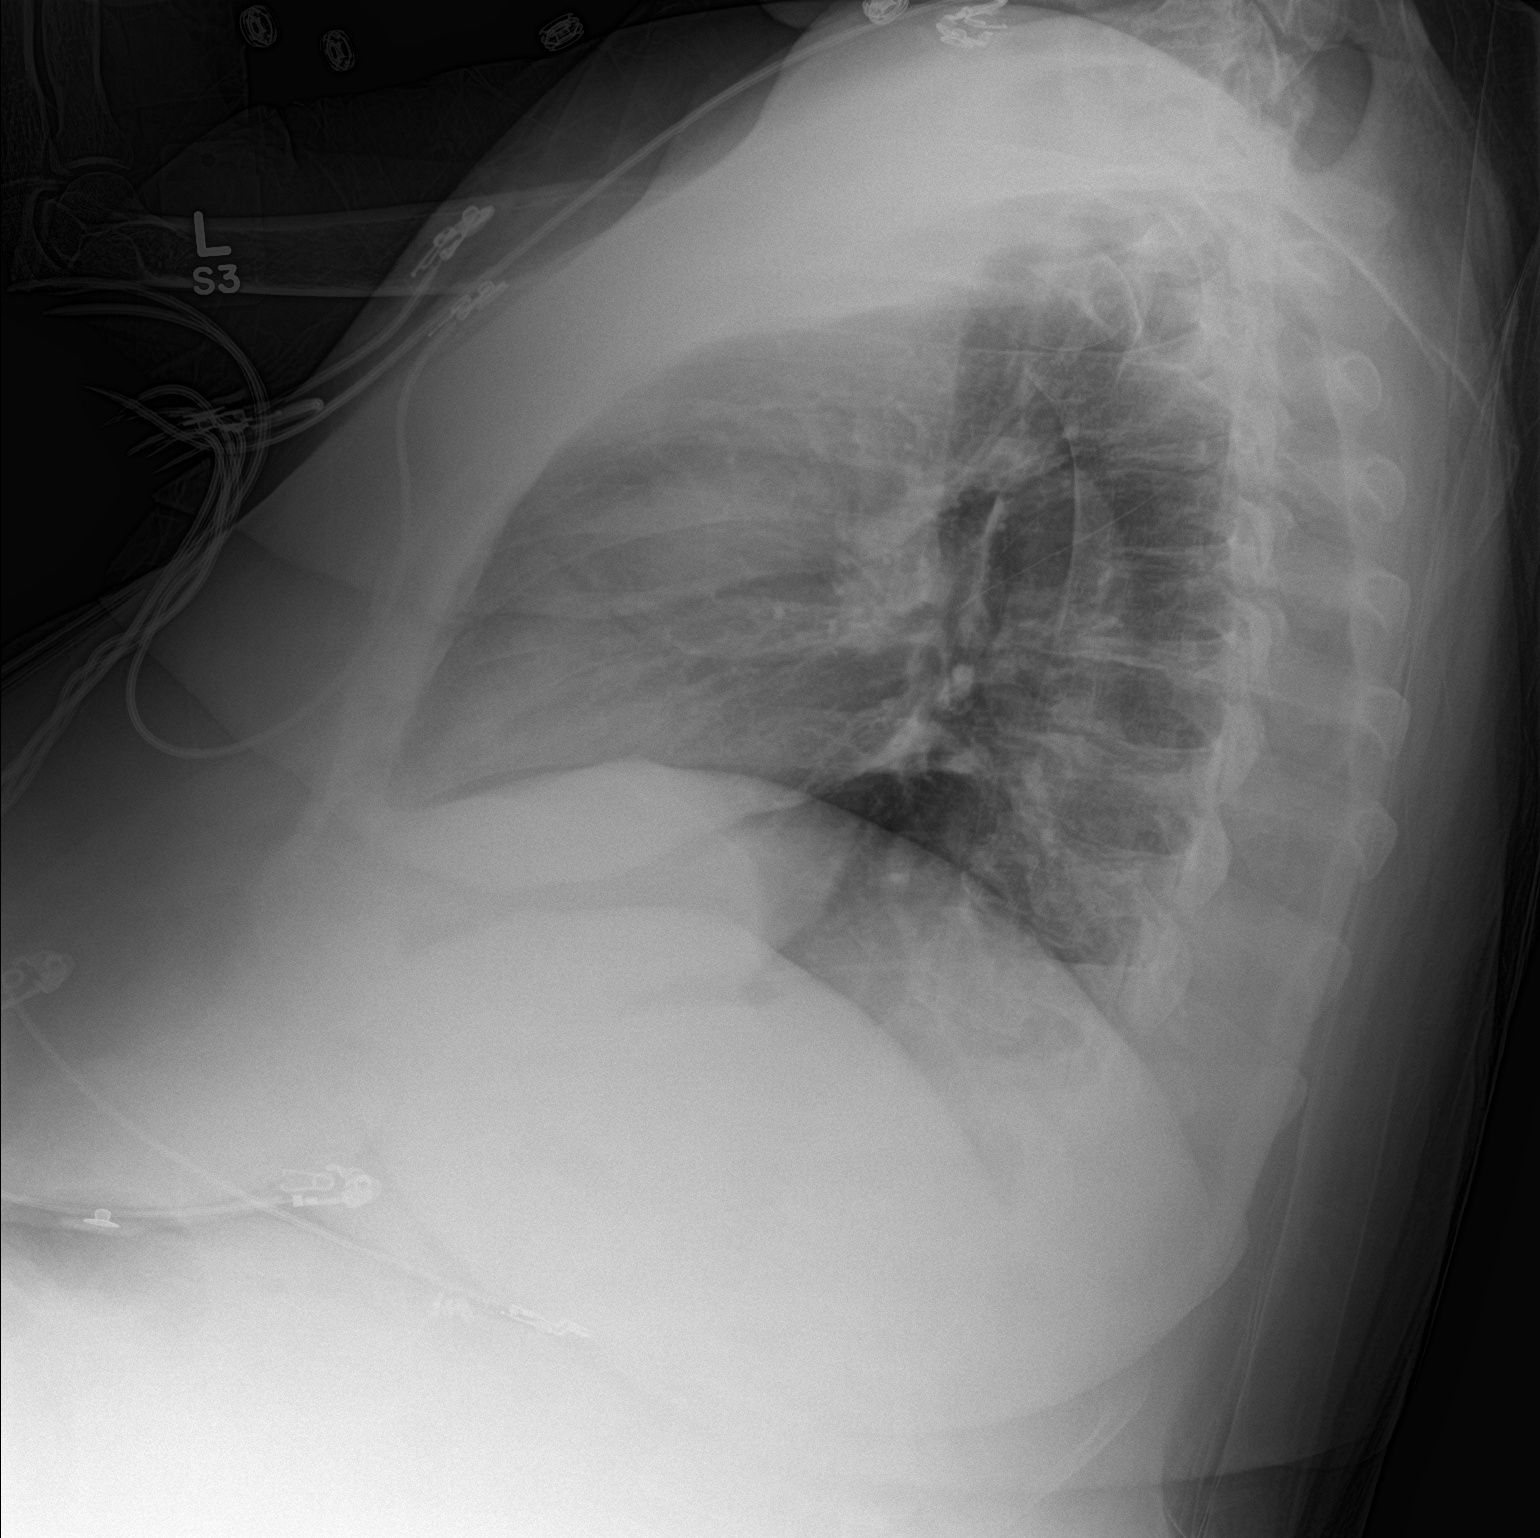

[chest ap]
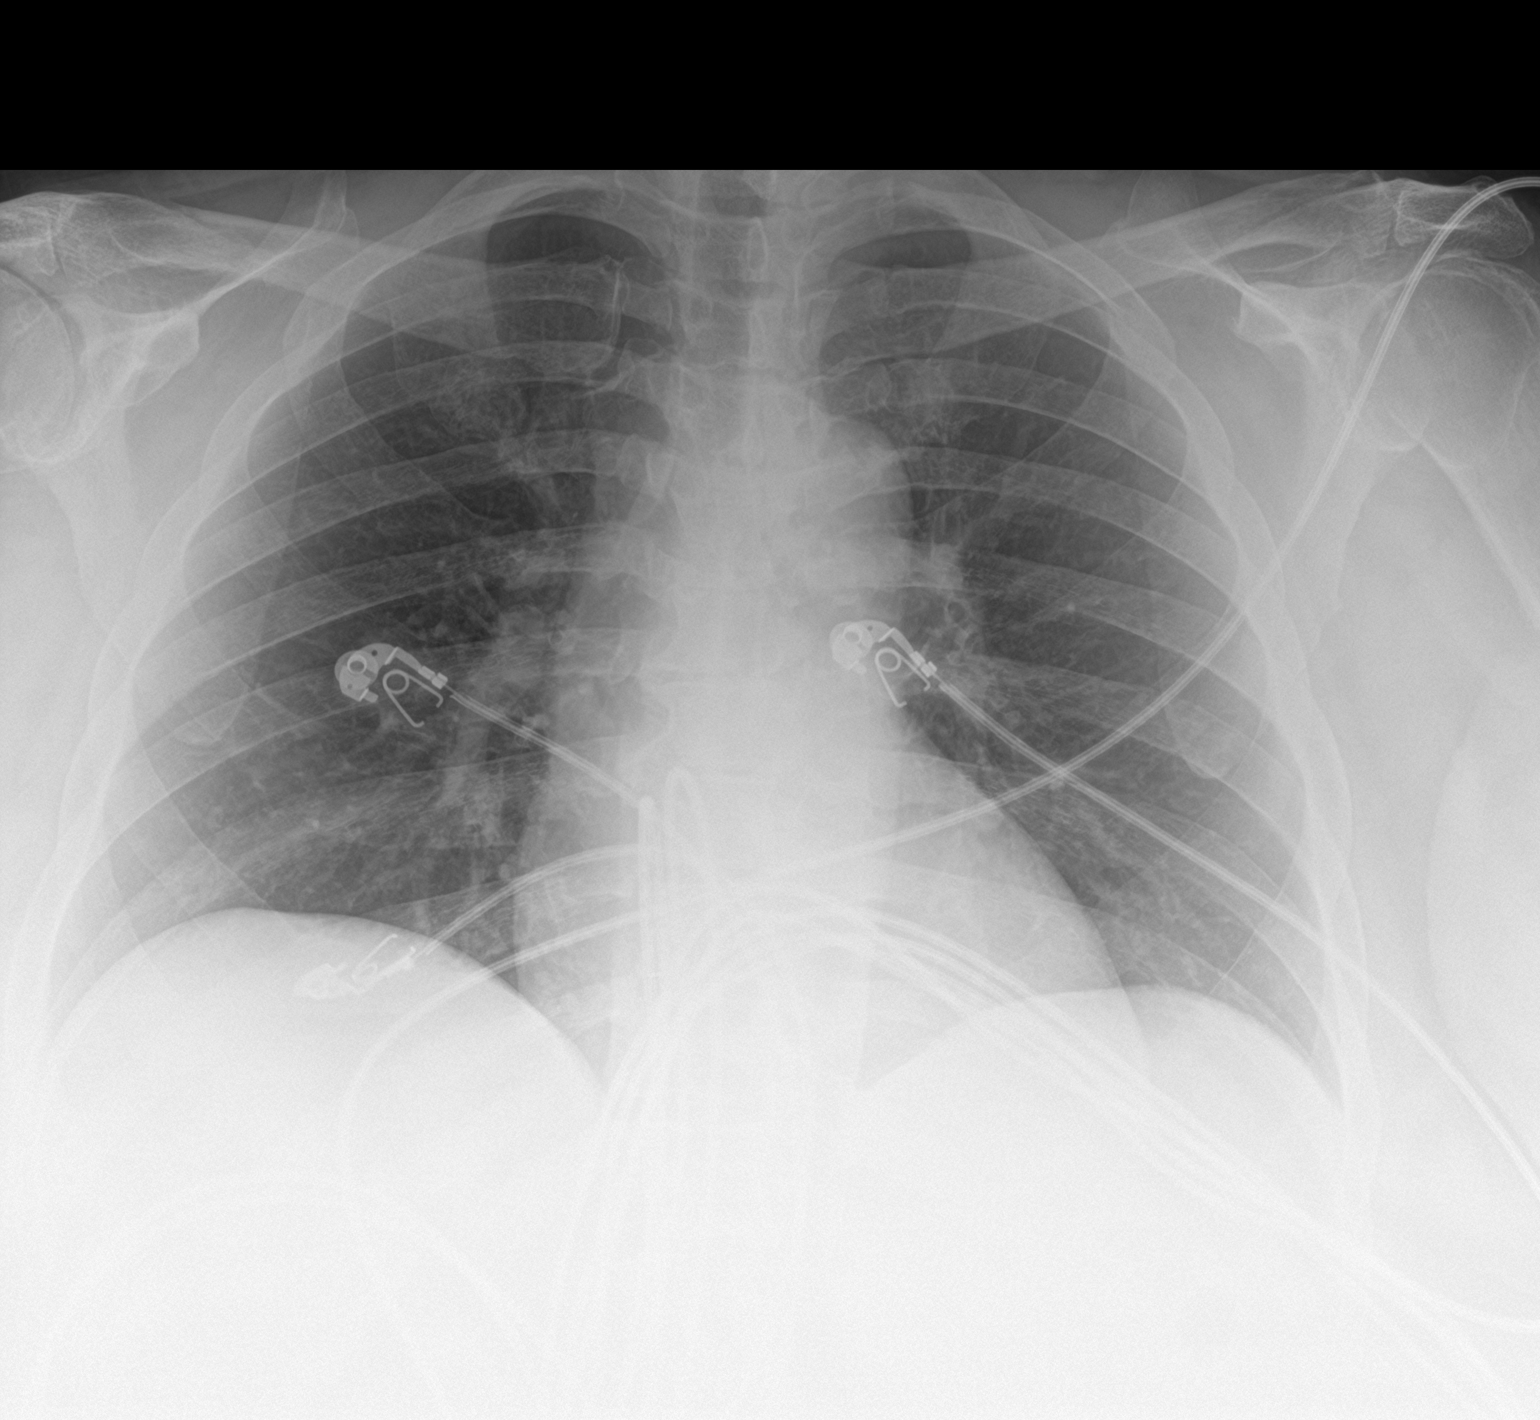

[2 of 2 positions shown; findings below may reference images not displayed]

FINDINGS: The heart size and mediastinal contours are within normal limits.
Both lungs are clear. The visualized skeletal structures are
unremarkable.
IMPRESSION: No active cardiopulmonary disease.

## 2018-09-28 IMAGING — DX DG CHEST 1V PORT
1 series · 1 of 1 positions shown · non-contrast
Comparison: Radiographs August 15, 2016.

CLINICAL DATA: Cough.

EXAM:
PORTABLE CHEST 1 VIEW

[chest ap]
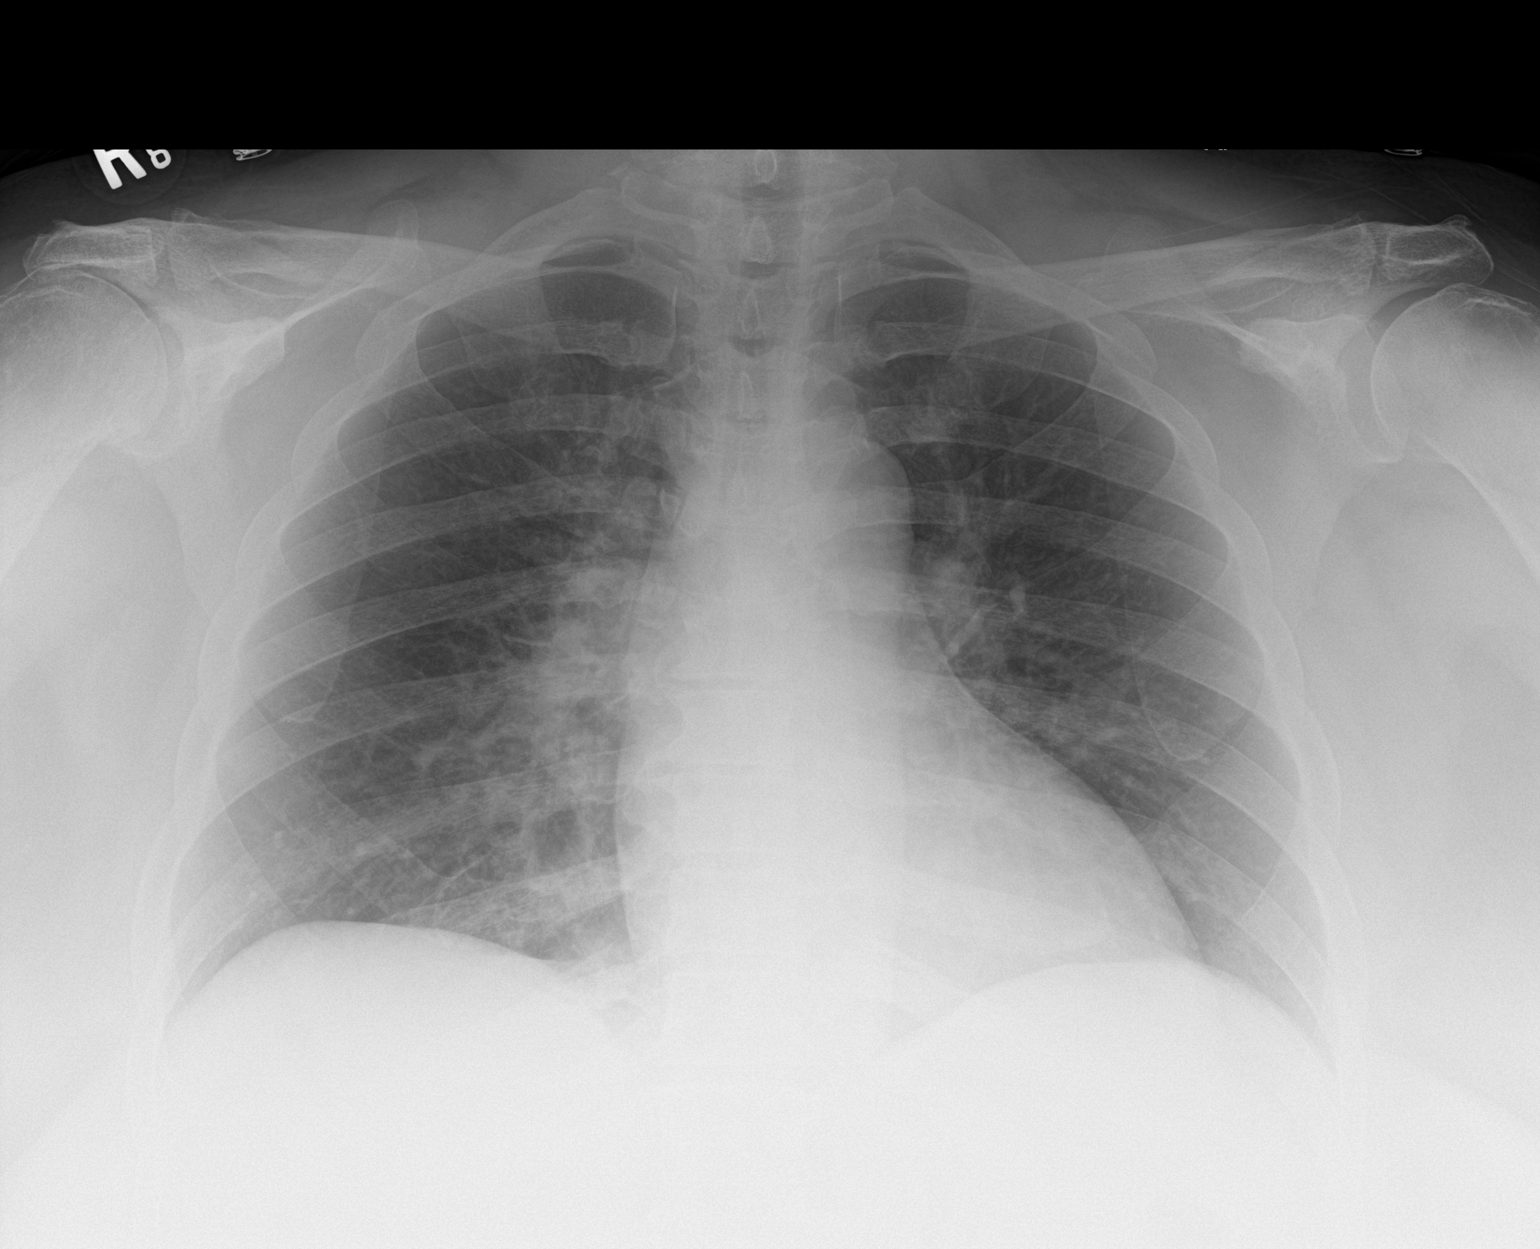

[1 of 1 positions shown; findings below may reference images not displayed]

FINDINGS: Normal cardiac silhouette is noted. No pneumothorax or pleural
effusion is noted. Right perihilar and infrahilar opacity is noted
which is increased compared to prior exam concerning for pneumonia
or atelectasis. Left lung is clear. The visualized skeletal
structures are unremarkable.
IMPRESSION: Right perihilar and infrahilar opacity is noted concerning for
pneumonia or atelectasis. Continued radiographic follow-up is
recommended.

## 2018-10-01 IMAGING — DX DG CHEST 2V
2 series · 2 of 2 positions shown · non-contrast
Comparison: 08/19/2016

CLINICAL DATA: Recent admitted to the medical floor with
intractable nausea and vomiting due to gastroparesis complicated by
urinary tract infection. Developed and continued cough - diabetic -
hx hypertension - never a smoker

EXAM:
CHEST  2 VIEW

[chest pa]
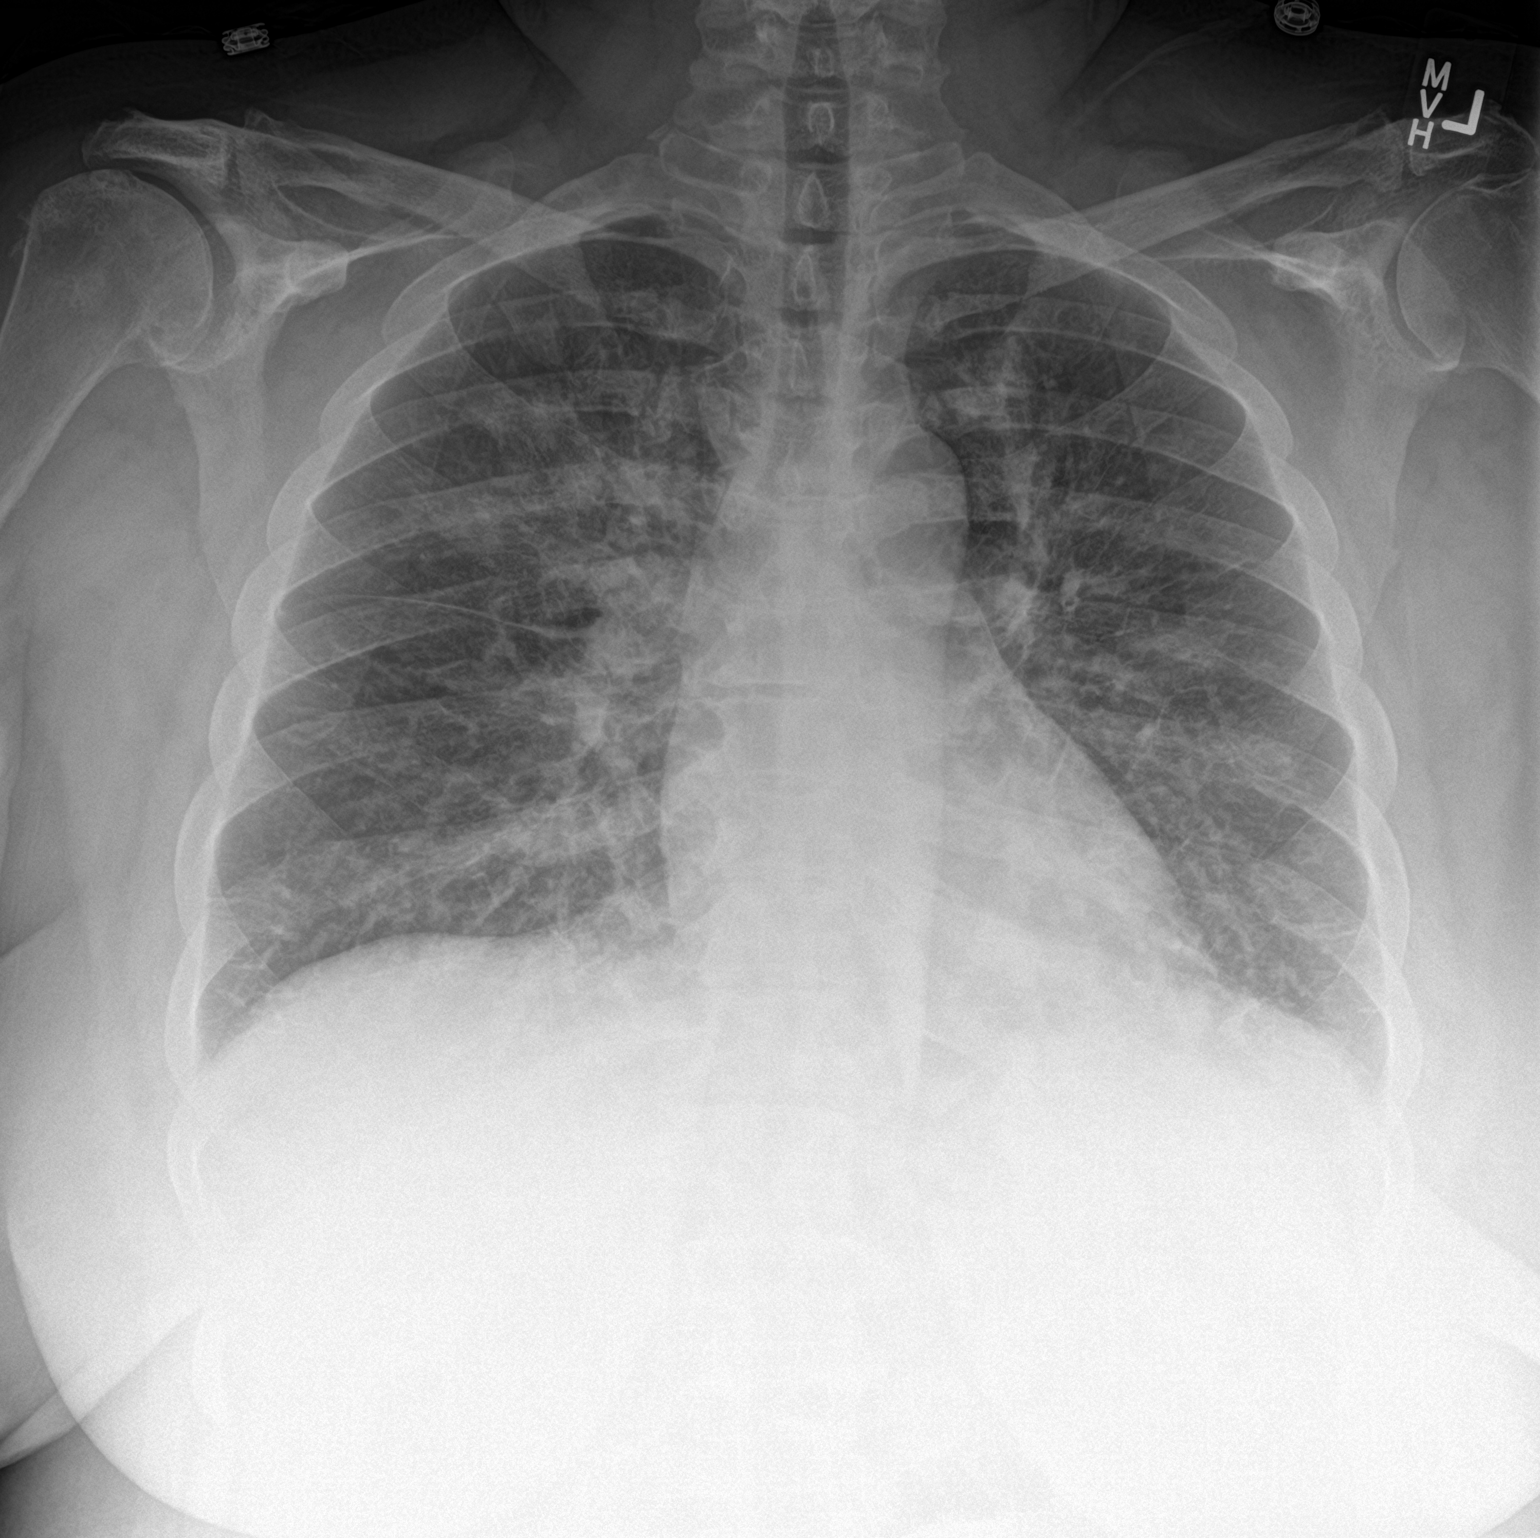

[chest lat]
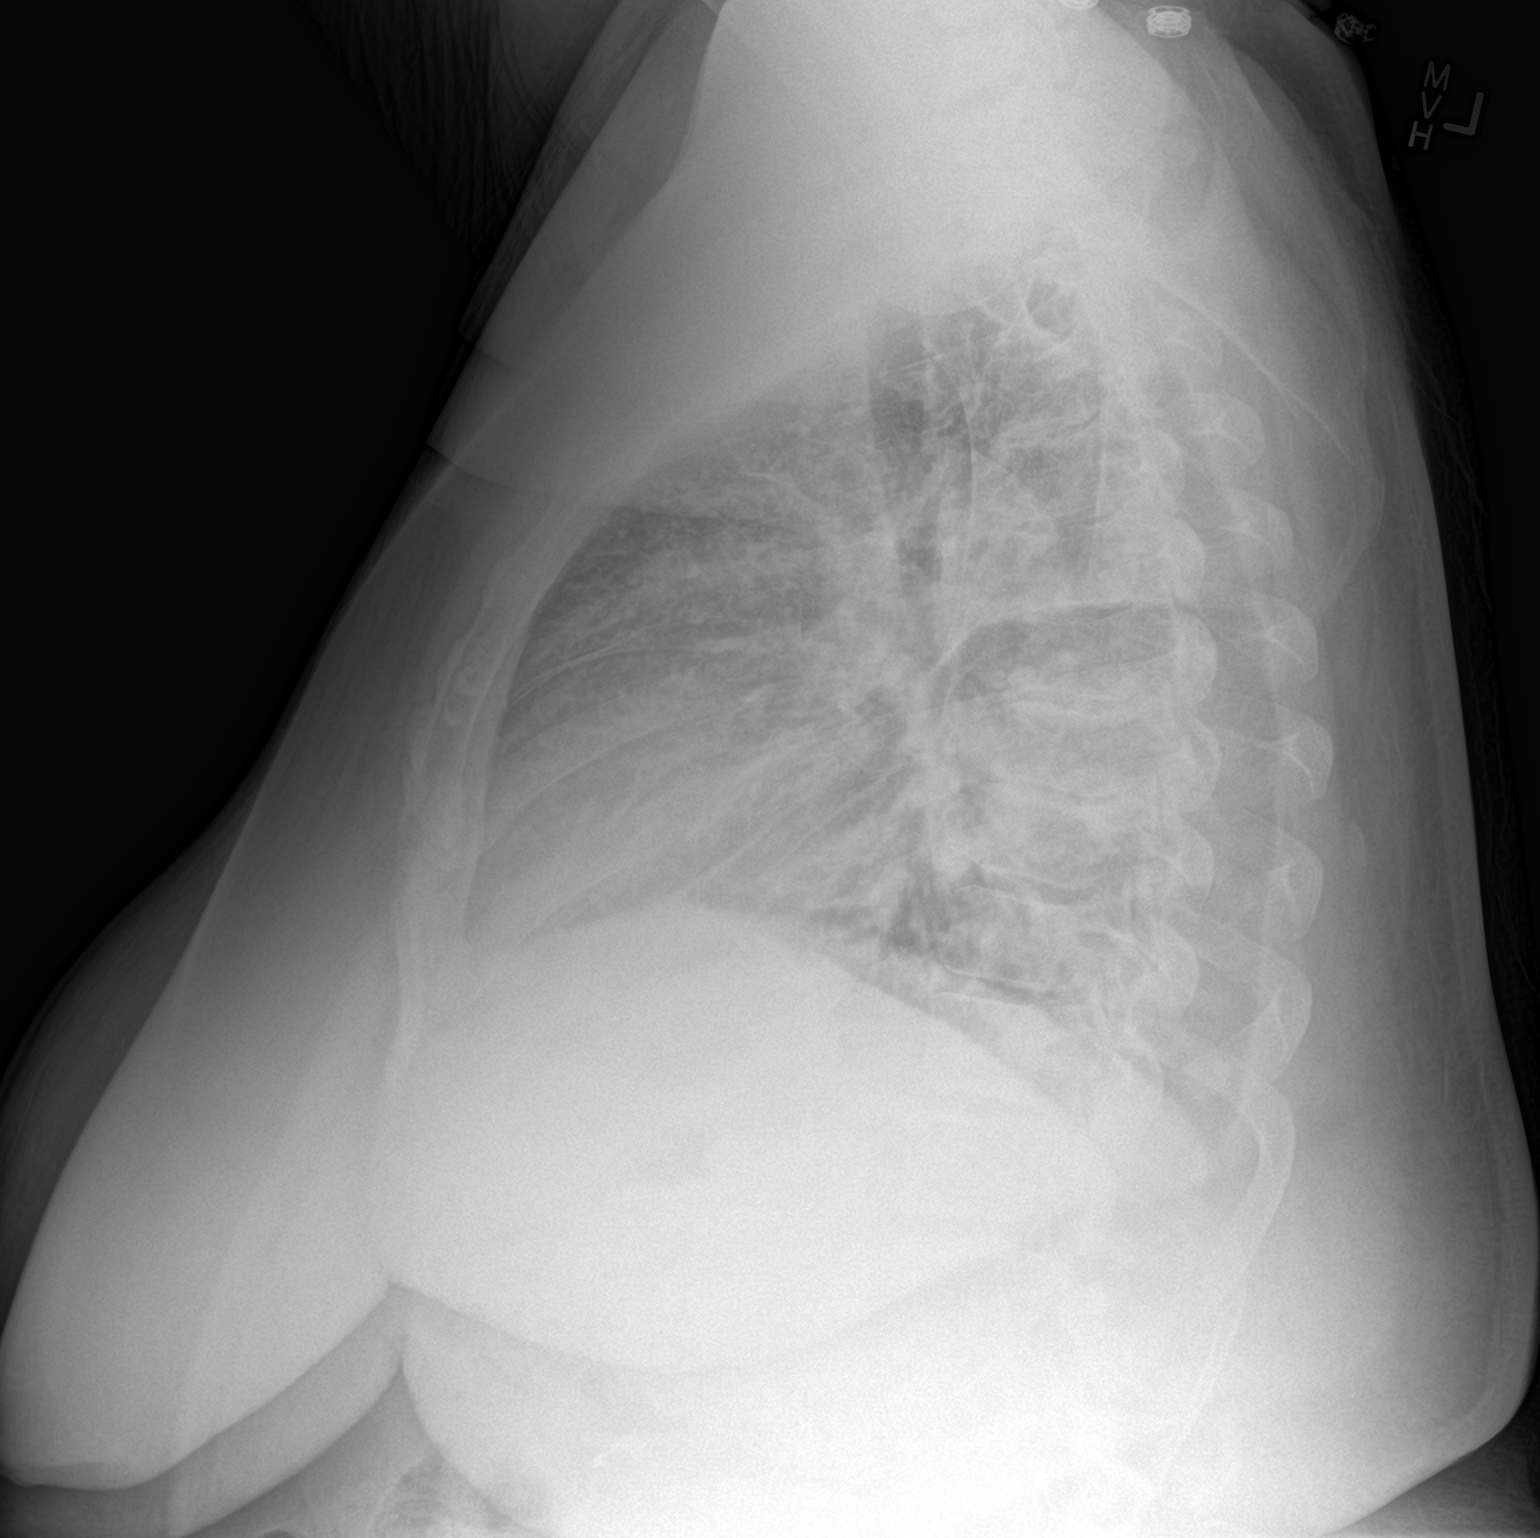

[2 of 2 positions shown; findings below may reference images not displayed]

FINDINGS: Heart is normal in size. There is interval increase in diffuse
interstitial markings, consistent with pulmonary edema. More
confluent opacity in the medial left lower lobe may represent early
alveolar edema. Infectious infiltrate could have a similar
appearance.
IMPRESSION: Increased interstitial edema.

Left lower lobe edema and/or infectious process.

## 2018-11-15 ENCOUNTER — Ambulatory Visit: Payer: Medicaid Other | Admitting: Family Medicine

## 2018-12-13 ENCOUNTER — Encounter: Payer: Self-pay | Admitting: Family Medicine

## 2018-12-13 ENCOUNTER — Other Ambulatory Visit: Payer: Self-pay

## 2018-12-13 ENCOUNTER — Ambulatory Visit (INDEPENDENT_AMBULATORY_CARE_PROVIDER_SITE_OTHER): Payer: Medicare Other | Admitting: Family Medicine

## 2018-12-13 ENCOUNTER — Other Ambulatory Visit: Payer: Self-pay | Admitting: Family Medicine

## 2018-12-13 VITALS — BP 140/80 | HR 104 | Temp 99.0°F | Wt 332.2 lb

## 2018-12-13 DIAGNOSIS — E538 Deficiency of other specified B group vitamins: Secondary | ICD-10-CM

## 2018-12-13 DIAGNOSIS — Z794 Long term (current) use of insulin: Secondary | ICD-10-CM | POA: Diagnosis not present

## 2018-12-13 DIAGNOSIS — Z23 Encounter for immunization: Secondary | ICD-10-CM | POA: Diagnosis not present

## 2018-12-13 DIAGNOSIS — E1165 Type 2 diabetes mellitus with hyperglycemia: Secondary | ICD-10-CM | POA: Diagnosis not present

## 2018-12-13 DIAGNOSIS — I509 Heart failure, unspecified: Secondary | ICD-10-CM

## 2018-12-13 DIAGNOSIS — E785 Hyperlipidemia, unspecified: Secondary | ICD-10-CM | POA: Diagnosis not present

## 2018-12-13 DIAGNOSIS — I10 Essential (primary) hypertension: Secondary | ICD-10-CM | POA: Diagnosis present

## 2018-12-13 DIAGNOSIS — I5032 Chronic diastolic (congestive) heart failure: Secondary | ICD-10-CM

## 2018-12-13 DIAGNOSIS — E1140 Type 2 diabetes mellitus with diabetic neuropathy, unspecified: Secondary | ICD-10-CM | POA: Diagnosis not present

## 2018-12-13 DIAGNOSIS — IMO0002 Reserved for concepts with insufficient information to code with codable children: Secondary | ICD-10-CM

## 2018-12-13 MED ORDER — HYDROXYZINE HCL 10 MG PO TABS: 10.0000 mg | ORAL_TABLET | Freq: Three times a day (TID) | ORAL | 0 refills | Status: DC | PRN

## 2018-12-13 MED ORDER — MELATONIN 3 MG PO TABS: 6.0000 mg | ORAL_TABLET | Freq: Every day | ORAL | 6 refills | Status: DC

## 2018-12-13 MED ORDER — BLOOD GLUCOSE MONITOR KIT: PACK | 0 refills | Status: DC

## 2018-12-13 MED ORDER — CETIRIZINE HCL 10 MG PO TABS: 10.0000 mg | ORAL_TABLET | Freq: Every day | ORAL | 11 refills | Status: DC

## 2018-12-13 MED ORDER — AMLODIPINE BESYLATE 10 MG PO TABS: 10.0000 mg | ORAL_TABLET | Freq: Every day | ORAL | 2 refills | Status: DC

## 2018-12-13 MED ORDER — METOPROLOL SUCCINATE ER 25 MG PO TB24: 25.0000 mg | ORAL_TABLET | Freq: Every day | ORAL | 2 refills | Status: DC

## 2018-12-13 MED ORDER — ATORVASTATIN CALCIUM 10 MG PO TABS: 10.0000 mg | ORAL_TABLET | Freq: Every day | ORAL | 11 refills | Status: DC

## 2018-12-13 MED ORDER — INSULIN LISPRO 100 UNIT/ML ~~LOC~~ SOLN: 10.0000 [IU] | Freq: Three times a day (TID) | SUBCUTANEOUS | 6 refills | Status: DC

## 2018-12-13 MED ORDER — INSULIN GLARGINE 100 UNIT/ML ~~LOC~~ SOLN: 45.0000 [IU] | Freq: Every day | SUBCUTANEOUS | 11 refills | Status: DC

## 2018-12-13 MED ORDER — ASPIRIN 81 MG PO TBEC: 81.0000 mg | DELAYED_RELEASE_TABLET | Freq: Every day | ORAL | 11 refills | Status: DC

## 2018-12-13 MED ORDER — NITROGLYCERIN 0.4 MG SL SUBL: 0.4000 mg | SUBLINGUAL_TABLET | SUBLINGUAL | 1 refills | Status: DC | PRN

## 2018-12-13 MED ORDER — METOCLOPRAMIDE HCL 10 MG PO TABS: 10.0000 mg | ORAL_TABLET | Freq: Three times a day (TID) | ORAL | 2 refills | Status: DC

## 2018-12-13 MED ORDER — FUROSEMIDE 20 MG PO TABS: 20.0000 mg | ORAL_TABLET | Freq: Every day | ORAL | 3 refills | Status: DC

## 2018-12-13 MED ORDER — PANTOPRAZOLE SODIUM 40 MG PO TBEC: 40.0000 mg | DELAYED_RELEASE_TABLET | Freq: Two times a day (BID) | ORAL | 1 refills | Status: DC

## 2018-12-13 NOTE — Patient Instructions (Addendum)
It was great to meet you today! Thank you for letting me participate in your care!  Today, we discussed several things. First, for your high blood pressure I did give you some lasix to take as needed when you are fluid overloaded. Please weigh yourself in the mornings every day and take one pill if you have 3lbs or more of weight gain. If you have 5lbs or more with symptoms of shortness of breath call our office.   Please also start checking your blood sugar in the mornings daily. I have refilled your Lantus and Novolog and sent a referral to an endocrinologist.  For your heart failure I have sent you a referral to a Cardiologist so you can establish care with them.    Be well, Harolyn Rutherford, DO PGY-3, [facility]

## 2018-12-13 NOTE — Progress Notes (Signed)
Subjective: Chief Complaint  Patient presents with  . Establish Care  . Medication Refill   HPI: Deborah [name] is a 53 y.o. presenting to clinic today to discuss the following:  HTN Patient with HTN and just at systolic goal today with BP of 140/80. She cannot take an ACE or ARB as she had an allergic reaction of anaphylaxis in the past. She is currently taking amlodipine and lasix at the rehab center where she was living before finding a home.   T2DM Patient states she was taking 45U of Lantus at night time daily and 10U of Novolog with each meal but would increase it based on her blood glucose taken before each meal. She cannot tolerate metformin. She requests a referral to an endocrinologist as she has seen one in the past. A1c today is above goal at 9.5%  CHF History of HFpEF with LVEF of 60% via Echocardiogram performed on 11/30/2017. Does not have a cardiologist in [place] and would like one. No current SOB or difficulty breathing. She does endorse some lower extremity leg swelling but no pain. No chest pressure or chest pain today or recently.  PMH: Hx of stroke with deficients, HTN, DM, CHF, GERD, Sleep Apnea, Arthritis, Chronic Pain, Anxiety PSH: Cholecystectomy FH: early death, HTN SH: denies tobacco, EtOH, or drug use Allergies: Ace inhibitors  ROS noted in HPI.    Social History   Tobacco Use  Smoking Status Never Smoker  Smokeless Tobacco Never Used    Objective: BP 140/80   Pulse (!) 104   Temp 99 F (37.2 C) (Axillary)   Wt (!) 332 lb 3.2 oz (150.7 kg)   LMP 10/10/2012   SpO2 97%   BMI 53.62 kg/m  Vitals and nursing notes reviewed  Physical Exam Gen: Alert and Oriented x 3, NAD HEENT: Normocephalic, atraumatic, PERRLA, EOMI Neck: no thyroidmegaly, no LAD CV: RRR, no murmurs, normal S1, S2 split Resp: CTAB, no wheezing, rales, or rhonchi, comfortable work of breathing Ext: no clubbing, cyanosis, +1 bilateral pitting edema Skin: warm,  dry, intact, no rashes  Assessment/Plan:  Uncontrolled type 2 diabetes mellitus with diabetic neuropathy, with long-term current use of insulin (HCC) Uncontrolled. Cannot tolerate metformin but will try to maximize orals since she is already on a large dose of insulin. She does state she has been out of her insulin recently as the rehab center would not "give her any" so will refill today - At next visit start either GLP-1 or SGLT-2 to help lower A1c - Referral to endocrinologist as she reports her blood sugar has been difficult to control in the past - Refilled Lantus, Novolog, and glucose monitoring kit  Essential hypertension, benign Borderline controlled. Patient is not checking at home. She cannot tolerate ACE/ARB. If continues to be poorly controlled will consider adding hydralazine. - Refilled amlodipine 28m daily - Refilled Lasix 211mdaily - Started metoprolol today - Patient to start checking BP at home in am - f/u with me in one month.  Chronic diastolic heart failure (HCC) No active symptoms of chest pain, pressure, difficulty breathing today. No sounds of crackles in the lungs and does not appear to be fluid overloaded. Mild edema in both legs but unclear if this is baseline for her as this is my first time seeing her. - Referral to cardiology to manage her diastolic heart failure - Cont metoprolol - Cont Lasix - Elevate legs at night - wear compression stockings   PATIENT EDUCATION PROVIDED: See  AVS    Diagnosis and plan along with any newly prescribed medication(s) were discussed in detail with this patient today. The patient verbalized understanding and agreed with the plan. Patient advised if symptoms worsen return to clinic or ER.   Orders Placed This Encounter  Procedures  . Flu Vaccine QUAD 36+ mos IM  . Lipid panel    Order Specific Question:   Has the patient fasted?    Answer:   No  . Hemoglobin A1c  . CBC  . BASIC METABOLIC PANEL WITH GFR  . Magnesium   . Vitamin B12  . Ambulatory referral to Cardiology    Referral Priority:   Routine    Referral Type:   Consultation    Referral Reason:   Specialty Services Required    Requested Specialty:   Cardiology    Number of Visits Requested:   1  . Ambulatory referral to Endocrinology    Referral Priority:   Routine    Referral Type:   Consultation    Referral Reason:   Specialty Services Required    Number of Visits Requested:   1    Meds ordered this encounter  Medications  . DISCONTD: hydrOXYzine (ATARAX/VISTARIL) 10 MG tablet    Sig: Take 1 tablet (10 mg total) by mouth 3 (three) times daily as needed.    Dispense:  30 tablet    Refill:  0  . DISCONTD: insulin glargine (LANTUS) 100 UNIT/ML injection    Sig: Inject 0.45 mLs (45 Units total) into the skin at bedtime.    Dispense:  10 mL    Refill:  11  . amLODipine (NORVASC) 10 MG tablet    Sig: Take 1 tablet (10 mg total) by mouth daily.    Dispense:  30 tablet    Refill:  2  . aspirin (ECOTRIN LOW STRENGTH) 81 MG EC tablet    Sig: Take 1 tablet (81 mg total) by mouth daily. Swallow whole.    Dispense:  30 tablet    Refill:  11  . atorvastatin (LIPITOR) 10 MG tablet    Sig: Take 1 tablet (10 mg total) by mouth daily.    Dispense:  30 tablet    Refill:  11  . cetirizine (ZYRTEC) 10 MG tablet    Sig: Take 1 tablet (10 mg total) by mouth daily.    Dispense:  30 tablet    Refill:  11  . hydrOXYzine (ATARAX/VISTARIL) 10 MG tablet    Sig: Take 1 tablet (10 mg total) by mouth 3 (three) times daily as needed.    Dispense:  30 tablet    Refill:  0  . insulin lispro (HUMALOG) 100 UNIT/ML injection    Sig: Inject 0.1 mLs (10 Units total) into the skin 3 (three) times daily.    Dispense:  10 mL    Refill:  6  . Melatonin 3 MG TABS    Sig: Take 2 tablets (6 mg total) by mouth at bedtime.    Dispense:  30 tablet    Refill:  6  . nitroGLYCERIN (NITROSTAT) 0.4 MG SL tablet    Sig: Place 1 tablet (0.4 mg total) under the tongue every  5 (five) minutes as needed for chest pain.    Dispense:  30 tablet    Refill:  1  . DISCONTD: metoCLOPramide (REGLAN) 10 MG tablet    Sig: Take 1 tablet (10 mg total) by mouth 3 (three) times daily before meals.    Dispense:  90  tablet    Refill:  2  . metoprolol succinate (TOPROL-XL) 25 MG 24 hr tablet    Sig: Take 1 tablet (25 mg total) by mouth daily.    Dispense:  30 tablet    Refill:  2  . pantoprazole (PROTONIX) 40 MG tablet    Sig: Take 1 tablet (40 mg total) by mouth 2 (two) times daily.    Dispense:  60 tablet    Refill:  1  . DISCONTD: furosemide (LASIX) 20 MG tablet    Sig: Take 1 tablet (20 mg total) by mouth daily.    Dispense:  30 tablet    Refill:  3  . blood glucose meter kit and supplies KIT    Sig: Dispense based on patient and insurance preference. Use up to four times daily as directed. (FOR ICD-9 250.00, 250.01).    Dispense:  1 each    Refill:  0    Order Specific Question:   Number of strips    Answer:   605    Order Specific Question:   Number of lancets    Answer:   [place], DO 12/13/2018, 10:10 AM PGY-3 [place]

## 2018-12-14 LAB — SPECIMEN STATUS REPORT

## 2018-12-15 ENCOUNTER — Other Ambulatory Visit: Payer: Self-pay

## 2018-12-15 ENCOUNTER — Emergency Department (HOSPITAL_COMMUNITY): Payer: Medicare Other

## 2018-12-15 ENCOUNTER — Inpatient Hospital Stay (HOSPITAL_COMMUNITY)
Admission: EM | Admit: 2018-12-15 | Discharge: 2018-12-16 | DRG: 074 | Discharge status: Against Medical Advice | Payer: Medicare Other | Attending: Family Medicine | Admitting: Family Medicine

## 2018-12-15 DIAGNOSIS — E119 Type 2 diabetes mellitus without complications: Secondary | ICD-10-CM

## 2018-12-15 DIAGNOSIS — N179 Acute kidney failure, unspecified: Secondary | ICD-10-CM | POA: Diagnosis present

## 2018-12-15 DIAGNOSIS — Z886 Allergy status to analgesic agent status: Secondary | ICD-10-CM | POA: Diagnosis not present

## 2018-12-15 DIAGNOSIS — Z9114 Patient's other noncompliance with medication regimen: Secondary | ICD-10-CM | POA: Diagnosis not present

## 2018-12-15 DIAGNOSIS — Z794 Long term (current) use of insulin: Secondary | ICD-10-CM | POA: Diagnosis not present

## 2018-12-15 DIAGNOSIS — K219 Gastro-esophageal reflux disease without esophagitis: Secondary | ICD-10-CM | POA: Diagnosis present

## 2018-12-15 DIAGNOSIS — K3184 Gastroparesis: Secondary | ICD-10-CM | POA: Diagnosis present

## 2018-12-15 DIAGNOSIS — I5020 Unspecified systolic (congestive) heart failure: Secondary | ICD-10-CM | POA: Diagnosis present

## 2018-12-15 DIAGNOSIS — Z20828 Contact with and (suspected) exposure to other viral communicable diseases: Secondary | ICD-10-CM | POA: Diagnosis present

## 2018-12-15 DIAGNOSIS — Z833 Family history of diabetes mellitus: Secondary | ICD-10-CM | POA: Diagnosis not present

## 2018-12-15 DIAGNOSIS — Z91041 Radiographic dye allergy status: Secondary | ICD-10-CM

## 2018-12-15 DIAGNOSIS — I130 Hypertensive heart and chronic kidney disease with heart failure and stage 1 through stage 4 chronic kidney disease, or unspecified chronic kidney disease: Secondary | ICD-10-CM | POA: Diagnosis present

## 2018-12-15 DIAGNOSIS — Z79899 Other long term (current) drug therapy: Secondary | ICD-10-CM

## 2018-12-15 DIAGNOSIS — M797 Fibromyalgia: Secondary | ICD-10-CM | POA: Diagnosis present

## 2018-12-15 DIAGNOSIS — R778 Other specified abnormalities of plasma proteins: Secondary | ICD-10-CM | POA: Diagnosis not present

## 2018-12-15 DIAGNOSIS — F419 Anxiety disorder, unspecified: Secondary | ICD-10-CM | POA: Diagnosis present

## 2018-12-15 DIAGNOSIS — G4733 Obstructive sleep apnea (adult) (pediatric): Secondary | ICD-10-CM | POA: Diagnosis present

## 2018-12-15 DIAGNOSIS — E785 Hyperlipidemia, unspecified: Secondary | ICD-10-CM | POA: Diagnosis present

## 2018-12-15 DIAGNOSIS — Z6841 Body Mass Index (BMI) 40.0 and over, adult: Secondary | ICD-10-CM | POA: Diagnosis not present

## 2018-12-15 DIAGNOSIS — Z7982 Long term (current) use of aspirin: Secondary | ICD-10-CM

## 2018-12-15 DIAGNOSIS — R112 Nausea with vomiting, unspecified: Secondary | ICD-10-CM

## 2018-12-15 DIAGNOSIS — Z888 Allergy status to other drugs, medicaments and biological substances status: Secondary | ICD-10-CM

## 2018-12-15 DIAGNOSIS — R7989 Other specified abnormal findings of blood chemistry: Secondary | ICD-10-CM

## 2018-12-15 DIAGNOSIS — E781 Pure hyperglyceridemia: Secondary | ICD-10-CM | POA: Diagnosis present

## 2018-12-15 DIAGNOSIS — E860 Dehydration: Secondary | ICD-10-CM | POA: Diagnosis present

## 2018-12-15 DIAGNOSIS — E1165 Type 2 diabetes mellitus with hyperglycemia: Secondary | ICD-10-CM | POA: Diagnosis present

## 2018-12-15 DIAGNOSIS — E1122 Type 2 diabetes mellitus with diabetic chronic kidney disease: Secondary | ICD-10-CM | POA: Diagnosis present

## 2018-12-15 DIAGNOSIS — M109 Gout, unspecified: Secondary | ICD-10-CM | POA: Diagnosis present

## 2018-12-15 DIAGNOSIS — Z880 Allergy status to penicillin: Secondary | ICD-10-CM | POA: Diagnosis not present

## 2018-12-15 DIAGNOSIS — E861 Hypovolemia: Secondary | ICD-10-CM | POA: Diagnosis present

## 2018-12-15 DIAGNOSIS — E1143 Type 2 diabetes mellitus with diabetic autonomic (poly)neuropathy: Principal | ICD-10-CM | POA: Diagnosis present

## 2018-12-15 DIAGNOSIS — E1110 Type 2 diabetes mellitus with ketoacidosis without coma: Secondary | ICD-10-CM

## 2018-12-15 DIAGNOSIS — Z8673 Personal history of transient ischemic attack (TIA), and cerebral infarction without residual deficits: Secondary | ICD-10-CM

## 2018-12-15 DIAGNOSIS — N183 Chronic kidney disease, stage 3 unspecified: Secondary | ICD-10-CM | POA: Diagnosis present

## 2018-12-15 LAB — COMPREHENSIVE METABOLIC PANEL
ALT: 24 U/L (ref 0–44)
AST: 23 U/L (ref 15–41)
Albumin: 3.3 g/dL — ABNORMAL LOW (ref 3.5–5.0)
Alkaline Phosphatase: 102 U/L (ref 38–126)
Anion gap: 18 — ABNORMAL HIGH (ref 5–15)
BUN: 15 mg/dL (ref 6–20)
CO2: 20 mmol/L — ABNORMAL LOW (ref 22–32)
Calcium: 9.1 mg/dL (ref 8.9–10.3)
Chloride: 99 mmol/L (ref 98–111)
Creatinine, Ser: 1.58 mg/dL — ABNORMAL HIGH (ref 0.44–1.00)
GFR calc Af Amer: 43 mL/min — ABNORMAL LOW (ref >60)
GFR calc non Af Amer: 37 mL/min — ABNORMAL LOW (ref >60)
Glucose, Bld: 609 mg/dL (ref 70–99)
Potassium: 4 mmol/L (ref 3.5–5.1)
Sodium: 137 mmol/L (ref 135–145)
Total Bilirubin: 0.8 mg/dL (ref 0.3–1.2)
Total Protein: 7.4 g/dL (ref 6.5–8.1)

## 2018-12-15 LAB — CBC
HCT: 34.6 % — ABNORMAL LOW (ref 36.0–46.0)
Hematocrit: 31.9 % — ABNORMAL LOW (ref 34.0–46.6)
Hemoglobin: 10.6 g/dL — ABNORMAL LOW (ref 12.0–15.0)
Hemoglobin: 10 g/dL — ABNORMAL LOW (ref 11.1–15.9)
MCH: 27.7 pg (ref 26.0–34.0)
MCH: 27.2 pg (ref 26.6–33.0)
MCHC: 30.6 g/dL (ref 30.0–36.0)
MCHC: 31.3 g/dL — ABNORMAL LOW (ref 31.5–35.7)
MCV: 90.3 fL (ref 80.0–100.0)
MCV: 87 fL (ref 79–97)
Platelets: 376 10*3/uL (ref 150–400)
Platelets: 372 10*3/uL (ref 150–450)
RBC: 3.83 MIL/uL — ABNORMAL LOW (ref 3.87–5.11)
RBC: 3.68 x10E6/uL — ABNORMAL LOW (ref 3.77–5.28)
RDW: 14.6 % (ref 11.5–15.5)
RDW: 14 % (ref 11.7–15.4)
WBC: 12.2 10*3/uL — ABNORMAL HIGH (ref 4.0–10.5)
WBC: 8.3 10*3/uL (ref 3.4–10.8)
nRBC: 0 % (ref 0.0–0.2)

## 2018-12-15 LAB — CBG MONITORING, ED
Glucose-Capillary: >600 mg/dL (ref 70–99)
Glucose-Capillary: 538 mg/dL (ref 70–99)
Glucose-Capillary: 485 mg/dL — ABNORMAL HIGH (ref 70–99)
Glucose-Capillary: 438 mg/dL — ABNORMAL HIGH (ref 70–99)
Glucose-Capillary: 333 mg/dL — ABNORMAL HIGH (ref 70–99)

## 2018-12-15 LAB — POCT I-STAT 7, (LYTES, BLD GAS, ICA,H+H)
Acid-base deficit: 3 mmol/L — ABNORMAL HIGH (ref 0.0–2.0)
Bicarbonate: 22.7 mmol/L (ref 20.0–28.0)
Calcium, Ion: 1.24 mmol/L (ref 1.15–1.40)
HCT: 33 % — ABNORMAL LOW (ref 36.0–46.0)
Hemoglobin: 11.2 g/dL — ABNORMAL LOW (ref 12.0–15.0)
O2 Saturation: 93 %
Patient temperature: 98
Potassium: 3.6 mmol/L (ref 3.5–5.1)
Sodium: 141 mmol/L (ref 135–145)
TCO2: 24 mmol/L (ref 22–32)
pCO2 arterial: 39.8 mmHg (ref 32.0–48.0)
pH, Arterial: 7.362 (ref 7.350–7.450)
pO2, Arterial: 68 mmHg — ABNORMAL LOW (ref 83.0–108.0)

## 2018-12-15 LAB — BASIC METABOLIC PANEL
Anion gap: 14 (ref 5–15)
Anion gap: 12 (ref 5–15)
BUN: 16 mg/dL (ref 6–20)
BUN: 17 mg/dL (ref 6–20)
CO2: 23 mmol/L (ref 22–32)
CO2: 24 mmol/L (ref 22–32)
Calcium: 9 mg/dL (ref 8.9–10.3)
Calcium: 8.8 mg/dL — ABNORMAL LOW (ref 8.9–10.3)
Chloride: 105 mmol/L (ref 98–111)
Chloride: 107 mmol/L (ref 98–111)
Creatinine, Ser: 1.79 mg/dL — ABNORMAL HIGH (ref 0.44–1.00)
Creatinine, Ser: 1.62 mg/dL — ABNORMAL HIGH (ref 0.44–1.00)
GFR calc Af Amer: 37 mL/min — ABNORMAL LOW (ref >60)
GFR calc Af Amer: 41 mL/min — ABNORMAL LOW (ref >60)
GFR calc non Af Amer: 32 mL/min — ABNORMAL LOW (ref >60)
GFR calc non Af Amer: 36 mL/min — ABNORMAL LOW (ref >60)
Glucose, Bld: 277 mg/dL — ABNORMAL HIGH (ref 70–99)
Glucose, Bld: 196 mg/dL — ABNORMAL HIGH (ref 70–99)
Potassium: 3.3 mmol/L — ABNORMAL LOW (ref 3.5–5.1)
Potassium: 3.3 mmol/L — ABNORMAL LOW (ref 3.5–5.1)
Sodium: 142 mmol/L (ref 135–145)
Sodium: 143 mmol/L (ref 135–145)

## 2018-12-15 LAB — TROPONIN I (HIGH SENSITIVITY)
Troponin I (High Sensitivity): 21 ng/L — ABNORMAL HIGH (ref <18)
Troponin I (High Sensitivity): 36 ng/L — ABNORMAL HIGH (ref <18)
Troponin I (High Sensitivity): 47 ng/L — ABNORMAL HIGH (ref <18)

## 2018-12-15 LAB — SARS CORONAVIRUS 2 (TAT 6-24 HRS): SARS Coronavirus 2: NEGATIVE

## 2018-12-15 LAB — URINALYSIS, ROUTINE W REFLEX MICROSCOPIC
Bacteria, UA: NONE SEEN
Bilirubin Urine: NEGATIVE
Glucose, UA: >=500 mg/dL — AB
Ketones, ur: 5 mg/dL — AB
Leukocytes,Ua: NEGATIVE
Nitrite: NEGATIVE
Protein, ur: >=300 mg/dL — AB
Specific Gravity, Urine: 1.026 (ref 1.005–1.030)
pH: 6 (ref 5.0–8.0)

## 2018-12-15 LAB — LIPID PANEL
Chol/HDL Ratio: 2.9 ratio (ref 0.0–4.4)
Cholesterol, Total: 106 mg/dL (ref 100–199)
HDL: 36 mg/dL — ABNORMAL LOW (ref >39)
LDL Chol Calc (NIH): 38 mg/dL (ref 0–99)
Triglycerides: 200 mg/dL — ABNORMAL HIGH (ref 0–149)
VLDL Cholesterol Cal: 32 mg/dL (ref 5–40)

## 2018-12-15 LAB — I-STAT BETA HCG BLOOD, ED (MC, WL, AP ONLY): I-stat hCG, quantitative: <5 m[IU]/mL (ref <5)

## 2018-12-15 LAB — HEMOGLOBIN A1C
Est. average glucose Bld gHb Est-mCnc: 226 mg/dL
Hgb A1c MFr Bld: 9.5 % — ABNORMAL HIGH (ref 4.8–5.6)

## 2018-12-15 LAB — BETA-HYDROXYBUTYRIC ACID: Beta-Hydroxybutyric Acid: 0.11 mmol/L (ref 0.05–0.27)

## 2018-12-15 LAB — GLUCOSE, CAPILLARY
Glucose-Capillary: 264 mg/dL — ABNORMAL HIGH (ref 70–99)
Glucose-Capillary: 206 mg/dL — ABNORMAL HIGH (ref 70–99)
Glucose-Capillary: 171 mg/dL — ABNORMAL HIGH (ref 70–99)

## 2018-12-15 LAB — PROTIME-INR
INR: 1.2 (ref 0.8–1.2)
Prothrombin Time: 14.7 seconds (ref 11.4–15.2)

## 2018-12-15 LAB — MAGNESIUM
Magnesium: 1.3 mg/dL — ABNORMAL LOW (ref 1.6–2.3)
Magnesium: 1.6 mg/dL — ABNORMAL LOW (ref 1.7–2.4)

## 2018-12-15 LAB — LIPASE, BLOOD: Lipase: 28 U/L (ref 11–51)

## 2018-12-15 LAB — BILIRUBIN, DIRECT: Bilirubin, Direct: <0.1 mg/dL (ref 0.0–0.2)

## 2018-12-15 LAB — VITAMIN B12: Vitamin B-12: 866 pg/mL (ref 232–1245)

## 2018-12-15 MED ORDER — AMLODIPINE BESYLATE 5 MG PO TABS: 10.0000 mg | ORAL_TABLET | Freq: Every day | ORAL | Status: DC

## 2018-12-15 MED ORDER — HALOPERIDOL LACTATE 5 MG/ML IJ SOLN
5.0000 mg | Freq: Once | INTRAMUSCULAR | Status: AC
  Administered 2018-12-15: 5 mg via INTRAVENOUS
  Filled 2018-12-15: qty 1

## 2018-12-15 MED ORDER — LORATADINE 10 MG PO TABS: 10.0000 mg | ORAL_TABLET | Freq: Every day | ORAL | Status: DC

## 2018-12-15 MED ORDER — PANTOPRAZOLE SODIUM 40 MG PO TBEC: 40.0000 mg | DELAYED_RELEASE_TABLET | Freq: Two times a day (BID) | ORAL | Status: DC

## 2018-12-15 MED ORDER — ALLOPURINOL 100 MG PO TABS: 100.0000 mg | ORAL_TABLET | Freq: Every day | ORAL | Status: DC

## 2018-12-15 MED ORDER — MELATONIN 3 MG PO TABS: 6.0000 mg | ORAL_TABLET | Freq: Every day | ORAL | Status: DC

## 2018-12-15 MED ORDER — METOPROLOL SUCCINATE ER 25 MG PO TB24: 25.0000 mg | ORAL_TABLET | Freq: Every day | ORAL | Status: DC

## 2018-12-15 MED ORDER — INSULIN REGULAR(HUMAN) IN NACL 100-0.9 UT/100ML-% IV SOLN
INTRAVENOUS | Status: DC
  Administered 2018-12-15: 5.4 [IU]/h via INTRAVENOUS
  Administered 2018-12-15: 6.7 [IU]/h via INTRAVENOUS
  Filled 2018-12-15 (×2): qty 100

## 2018-12-15 MED ORDER — LIDOCAINE VISCOUS HCL 2 % MT SOLN
15.0000 mL | Freq: Once | OROMUCOSAL | Status: AC
  Administered 2018-12-15: 15 mL via ORAL
  Filled 2018-12-15: qty 15

## 2018-12-15 MED ORDER — SODIUM CHLORIDE 0.9 % IV SOLN
INTRAVENOUS | Status: DC
  Administered 2018-12-15: 20:00:00 via INTRAVENOUS

## 2018-12-15 MED ORDER — MORPHINE SULFATE (PF) 2 MG/ML IV SOLN
2.0000 mg | INTRAVENOUS | Status: DC | PRN
  Administered 2018-12-15: 2 mg via INTRAVENOUS
  Filled 2018-12-15: qty 1

## 2018-12-15 MED ORDER — POTASSIUM CHLORIDE CRYS ER 20 MEQ PO TBCR: 20.0000 meq | EXTENDED_RELEASE_TABLET | Freq: Once | ORAL | Status: DC

## 2018-12-15 MED ORDER — SODIUM CHLORIDE 0.9 % IV BOLUS
1000.0000 mL | Freq: Once | INTRAVENOUS | Status: AC
  Administered 2018-12-15: 1000 mL via INTRAVENOUS

## 2018-12-15 MED ORDER — MORPHINE SULFATE (PF) 4 MG/ML IV SOLN
4.0000 mg | Freq: Once | INTRAVENOUS | Status: AC
  Administered 2018-12-15: 4 mg via INTRAVENOUS
  Filled 2018-12-15: qty 1

## 2018-12-15 MED ORDER — POTASSIUM CHLORIDE 10 MEQ/100ML IV SOLN
10.0000 meq | INTRAVENOUS | Status: AC
  Administered 2018-12-15: 10 meq via INTRAVENOUS
  Filled 2018-12-15: qty 100

## 2018-12-15 MED ORDER — SODIUM CHLORIDE 0.9% FLUSH
3.0000 mL | Freq: Once | INTRAVENOUS | Status: AC
  Administered 2018-12-15: 3 mL via INTRAVENOUS

## 2018-12-15 MED ORDER — NITROGLYCERIN 0.4 MG SL SUBL: 0.4000 mg | SUBLINGUAL_TABLET | SUBLINGUAL | Status: DC | PRN

## 2018-12-15 MED ORDER — ONDANSETRON HCL 4 MG/2ML IJ SOLN
4.0000 mg | Freq: Once | INTRAMUSCULAR | Status: AC
  Administered 2018-12-15: 4 mg via INTRAVENOUS
  Filled 2018-12-15: qty 2

## 2018-12-15 MED ORDER — POTASSIUM CHLORIDE 10 MEQ/100ML IV SOLN: 10.0000 meq | Freq: Once | INTRAVENOUS | Status: DC

## 2018-12-15 MED ORDER — ASPIRIN 81 MG PO TBEC: 81.0000 mg | DELAYED_RELEASE_TABLET | Freq: Every day | ORAL | Status: DC

## 2018-12-15 MED ORDER — DULOXETINE HCL 30 MG PO CPEP: 30.0000 mg | ORAL_CAPSULE | Freq: Two times a day (BID) | ORAL | Status: DC

## 2018-12-15 MED ORDER — FUROSEMIDE 20 MG PO TABS: 20.0000 mg | ORAL_TABLET | Freq: Every day | ORAL | Status: DC

## 2018-12-15 MED ORDER — DIPHENHYDRAMINE HCL 25 MG PO CAPS: 25.0000 mg | ORAL_CAPSULE | Freq: Four times a day (QID) | ORAL | Status: DC | PRN

## 2018-12-15 MED ORDER — SODIUM CHLORIDE 0.9 % IV SOLN
INTRAVENOUS | Status: DC
  Administered 2018-12-15: 16:00:00 via INTRAVENOUS

## 2018-12-15 MED ORDER — HYDROXYZINE HCL 10 MG PO TABS: 10.0000 mg | ORAL_TABLET | Freq: Three times a day (TID) | ORAL | Status: DC | PRN

## 2018-12-15 MED ORDER — ATORVASTATIN CALCIUM 10 MG PO TABS: 10.0000 mg | ORAL_TABLET | Freq: Every day | ORAL | Status: DC

## 2018-12-15 MED ORDER — DEXTROSE-NACL 5-0.45 % IV SOLN: INTRAVENOUS | Status: DC

## 2018-12-15 MED ORDER — POTASSIUM CHLORIDE CRYS ER 20 MEQ PO TBCR
40.0000 meq | EXTENDED_RELEASE_TABLET | Freq: Once | ORAL | Status: AC
  Administered 2018-12-16: 40 meq via ORAL
  Filled 2018-12-15: qty 2

## 2018-12-15 MED ORDER — ALUM & MAG HYDROXIDE-SIMETH 200-200-20 MG/5ML PO SUSP
30.0000 mL | Freq: Once | ORAL | Status: AC
  Administered 2018-12-15: 30 mL via ORAL
  Filled 2018-12-15: qty 30

## 2018-12-15 MED ORDER — MORPHINE SULFATE (PF) 4 MG/ML IV SOLN
6.0000 mg | Freq: Once | INTRAVENOUS | Status: AC
  Administered 2018-12-15: 6 mg via INTRAVENOUS
  Filled 2018-12-15: qty 2

## 2018-12-15 MED ORDER — ENOXAPARIN SODIUM 40 MG/0.4ML ~~LOC~~ SOLN
40.0000 mg | SUBCUTANEOUS | Status: DC
  Administered 2018-12-15 – 2018-12-16 (×2): 40 mg via SUBCUTANEOUS
  Filled 2018-12-15 (×2): qty 0.4

## 2018-12-15 MED ORDER — ONDANSETRON HCL 4 MG/2ML IJ SOLN: 4.0000 mg | Freq: Four times a day (QID) | INTRAMUSCULAR | Status: DC | PRN

## 2018-12-15 MED ORDER — INSULIN GLARGINE 100 UNIT/ML ~~LOC~~ SOLN
30.0000 [IU] | SUBCUTANEOUS | Status: DC
  Administered 2018-12-16: 30 [IU] via SUBCUTANEOUS
  Filled 2018-12-15 (×2): qty 0.3

## 2018-12-15 MED ORDER — METOCLOPRAMIDE HCL 10 MG PO TABS: 10.0000 mg | ORAL_TABLET | Freq: Three times a day (TID) | ORAL | Status: DC

## 2018-12-15 MED ORDER — INSULIN ASPART 100 UNIT/ML ~~LOC~~ SOLN
0.0000 [IU] | Freq: Three times a day (TID) | SUBCUTANEOUS | Status: DC
  Administered 2018-12-16 (×2): 5 [IU] via SUBCUTANEOUS

## 2018-12-15 NOTE — Progress Notes (Signed)
FPTS Interim Progress Note  Will transition patient to phase 2 protocol. Patient unable to get IVF due to losing IV. Lab error where blood was not initially processed at time of order so BMP is still pending. Given concern of possible hypoglycemia while on insulin gtt will transition to phase 2.   Lantus 30 units ordered as patient is on home 45U and has poor glycemic control (A1C 9.5) so will go slightly above 1/2 her home dose (confirmed this with pharmacy).  Called RN to inform her of plan and lab error. Stressed importance of patient eating to ensure she doesn't become hypoglycemic. CBGs q1hr.   Caroline More, DO 12/15/2018, 11:03 PM PGY-3, [facility] pager [phone]

## 2018-12-15 NOTE — ED Notes (Signed)
Pt was reminded of the need for a urine specimen.  When staff offered an in and out catheter pt refused stated that we would have to wait for that urine specimen a little while longer.

## 2018-12-15 NOTE — ED Notes (Signed)
Attempted blood draw without success

## 2018-12-15 NOTE — ED Provider Notes (Signed)
[facility] EMERGENCY DEPARTMENT Provider Note   [name]: [id number] Arrival date & time: 12/15/18  9163     History   Chief Complaint Chief Complaint  Patient presents with  . Chest Pain    HPI Deborah [name] is a 53 y.o. female.     The history is provided by the patient and medical records. No language interpreter was used.  Chest Pain    53 year old female with history of chronic pain, diabetes, fibromyalgia, hypertension, GERD brought here via EMS from home for evaluation of chest pain.  Patient report throughout the night last night she endorsed abdominal pain that radiates towards her chest.  She described the pain as a sharp cramping sensation starting in her mid to lower abdomen and spread towards her chest and her shoulder.  Pain is moderate in severity with persistent nausea and nonbloody nonbilious vomit.  Pain felt similar to prior gastroparesis that she had in the past.  She does not complain of any fever or chills no lightheadedness or dizziness no pleuritic chest pain no productive cough no dysuria.  She denies alcohol or tobacco use he denies marijuana use.  She denies any recent sick contact.  She was brought here via EMS.  Patient was given 2 sublingual nitro prior to arrival without any relief.  Initial CBG was 599.  Past Medical History:  Diagnosis Date  . Acute back pain with sciatica, left   . Acute back pain with sciatica, right   . Anemia, unspecified   . Chest pain 12/2015  . Chronic pain   . Diabetes mellitus   . Esophageal reflux   . Fibromyalgia   . Gastric ulcer   . Gastroparesis   . Gout   . Hyperlipidemia   . Hypertension   . Lumbosacral stenosis   . Obesity   . Pneumonia   . [name]   . Stroke ([place]) 02/2011  . Vitamin B12 deficiency anemia     Patient Active Problem List   Diagnosis Date Noted  . Vomiting 07/18/2018  . Abdominal pain 07/17/2018  . Hyperkalemia 07/17/2018  . Cardiac arrest ([place])  11/29/2017  . Pelvic mass 11/29/2017  . Leukocytosis 11/29/2017  . Anxiety 11/29/2017  . Allergic reaction to contrast media, initial encounter 11/28/2017  . Palliative care encounter   . Back pain 03/19/2017  . Stroke (cerebrum) ([place]) 03/19/2017  . GERD (gastroesophageal reflux disease) 03/19/2017  . Depression 03/19/2017  . Obesity   . Urinary tract infection 08/16/2016  . Normocytic normochromic anemia 08/16/2016  . Gastroparesis 08/16/2016  . Intractable nausea and vomiting 06/17/2016  . Diabetic gastroparesis ([place]) 06/05/2016  . Gout 06/05/2016  . AKI (acute kidney injury) ([place]) 12/06/2015  . Chest pain 09/26/2015  . Hypokalemia 09/26/2015  . Hypomagnesemia 09/26/2015  . Nausea and vomiting 08/20/2015  . Gout flare 05/27/2015  . Lower abdominal pain 05/26/2015  . DKA (diabetic ketoacidoses) ([place]) 05/25/2015  . Uncontrolled type 2 diabetes mellitus with diabetic neuropathy, with long-term current use of insulin ([place]) 05/25/2015  . Dyslipidemia associated with type 2 diabetes mellitus ([place]) 05/25/2015  . CKD (chronic kidney disease), stage II 05/25/2015  . Essential hypertension, benign 09/28/2013    Past Surgical History:  Procedure Laterality Date  . CATARACT EXTRACTION  01/2014  . CHOLECYSTECTOMY       OB History   No obstetric history on file.      Home Medications    Prior to Admission medications   Medication Sig Start Date  End Date Taking? Authorizing Provider  allopurinol (ZYLOPRIM) 100 MG tablet Take 1 tablet (100 mg total) by mouth daily. 09/23/15   Debbe Odea, MD  amLODipine (NORVASC) 10 MG tablet Take 1 tablet (10 mg total) by mouth daily. 12/13/18   [name], DO  aspirin (ECOTRIN LOW STRENGTH) 81 MG EC tablet Take 1 tablet (81 mg total) by mouth daily. Swallow whole. 12/13/18   [name], DO  atorvastatin (LIPITOR) 10 MG tablet Take 1 tablet (10 mg total) by mouth daily. 12/13/18   [name], DO  blood glucose meter kit and  supplies KIT Dispense based on patient and insurance preference. Use up to four times daily as directed. (FOR ICD-9 250.00, 250.01). 12/13/18   [name], DO  cetirizine (ZYRTEC) 10 MG tablet Take 1 tablet (10 mg total) by mouth daily. 12/13/18   [name], DO  DULoxetine (CYMBALTA) 30 MG capsule Take 1 capsule (30 mg total) by mouth 2 (two) times daily. 09/23/15   Debbe Odea, MD  furosemide (LASIX) 20 MG tablet Take 1 tablet (20 mg total) by mouth daily. 12/13/18   [name], DO  hydrOXYzine (ATARAX/VISTARIL) 10 MG tablet Take 1 tablet (10 mg total) by mouth 3 (three) times daily as needed. 12/13/18   Lockamy, Christia Reading, DO  insulin glargine (LANTUS) 100 UNIT/ML injection Inject 0.45 mLs (45 Units total) into the skin at bedtime. 12/13/18   Lockamy, Christia Reading, DO  insulin lispro (HUMALOG) 100 UNIT/ML injection Inject 0.1 mLs (10 Units total) into the skin 3 (three) times daily. 12/13/18   [name], DO  Melatonin 3 MG TABS Take 2 tablets (6 mg total) by mouth at bedtime. 12/13/18   [name], DO  methocarbamol (ROBAXIN) 500 MG tablet Take 500 mg by mouth 2 (two) times a day.    [provider]  metoCLOPramide (REGLAN) 10 MG tablet Take 1 tablet (10 mg total) by mouth 3 (three) times daily before meals. 12/13/18   [name], DO  metoprolol succinate (TOPROL-XL) 25 MG 24 hr tablet Take 1 tablet (25 mg total) by mouth daily. 12/13/18   [name], DO  Multiple Vitamin (MULTIVITAMIN WITH MINERALS) TABS tablet Take 1 tablet by mouth daily.    [provider]  nitroGLYCERIN (NITROSTAT) 0.4 MG SL tablet Place 1 tablet (0.4 mg total) under the tongue every 5 (five) minutes as needed for chest pain. 12/13/18   Lockamy, Christia Reading, DO  pantoprazole (PROTONIX) 40 MG tablet Take 1 tablet (40 mg total) by mouth 2 (two) times daily. 12/13/18   [name], DO  potassium chloride (K-DUR) 10 MEQ tablet Take 2 tablets (20 mEq total) by mouth daily for 7 days.  Patient not taking: Reported on 11/28/2017 08/02/17 08/09/17  [name], MD    Family History Family History  Problem Relation Age of Onset  . Diabetes Mother   . Diabetes Father   . Heart disease Father   . Diabetes Sister   . Congestive Heart Failure Sister 64  . Diabetes Brother     Social History Social History   Tobacco Use  . Smoking status: Never Smoker  . Smokeless tobacco: Never Used  Substance Use Topics  . Alcohol use: No  . Drug use: No     Allergies   Contrast media [iodinated diagnostic agents], Diazepam, Isovue [iopamidol], Lisinopril, Penicillins, Acetaminophen, Tolmetin, Aspirin, Food, Nsaids, and Tramadol   Review of Systems Review of Systems  Cardiovascular: Positive for chest pain.  All other systems reviewed and are negative.  Physical Exam Updated Vital Signs BP (!) 153/61   Pulse (!) 107   Temp 97.9 F (36.6 C) (Oral)   Resp (!) 27   Ht _0  (1.676 m)   Wt (!) 150 kg   LMP 10/10/2012   SpO2 96%   BMI 53.37 kg/m   Physical Exam Vitals signs and nursing note reviewed.  Constitutional:      General: She is not in acute distress.    Appearance: She is well-developed. She is obese.     Comments: Obese female appears to be uncomfortable.  HENT:     Head: Atraumatic.  Eyes:     Conjunctiva/sclera: Conjunctivae normal.  Neck:     Musculoskeletal: Neck supple.  Cardiovascular:     Rate and Rhythm: Tachycardia present.     Pulses: Normal pulses.     Heart sounds: Normal heart sounds.  Pulmonary:     Effort: Pulmonary effort is normal.     Breath sounds: Normal breath sounds.  Abdominal:     Palpations: Abdomen is soft.     Tenderness: There is abdominal tenderness (Abdomen is diffusely tender without focal point tenderness.).  Skin:    Findings: No rash.  Neurological:     Mental Status: She is alert.  Psychiatric:        Mood and Affect: Mood normal.      ED Treatments / Results  Labs (all labs ordered are  listed, but only abnormal results are displayed) Labs Reviewed  CBC - Abnormal; Notable for the following components:      Result Value   WBC 12.2 (*)    RBC 3.83 (*)    Hemoglobin 10.6 (*)    HCT 34.6 (*)    All other components within normal limits  URINALYSIS, ROUTINE W REFLEX MICROSCOPIC - Abnormal; Notable for the following components:   Color, Urine STRAW (*)    Glucose, UA >=500 (*)    Hgb urine dipstick MODERATE (*)    Ketones, ur 5 (*)    Protein, ur >=300 (*)    All other components within normal limits  COMPREHENSIVE METABOLIC PANEL - Abnormal; Notable for the following components:   CO2 20 (*)    Glucose, Bld 609 (*)    Creatinine, Ser 1.58 (*)    Albumin 3.3 (*)    GFR calc non Af Amer 37 (*)    GFR calc Af Amer 43 (*)    Anion gap 18 (*)    All other components within normal limits  CBG MONITORING, ED - Abnormal; Notable for the following components:   Glucose-Capillary >600 (*)    All other components within normal limits  CBG MONITORING, ED - Abnormal; Notable for the following components:   Glucose-Capillary 538 (*)    All other components within normal limits  TROPONIN I (HIGH SENSITIVITY) - Abnormal; Notable for the following components:   Troponin I (High Sensitivity) 21 (*)    All other components within normal limits  SARS CORONAVIRUS 2 (TAT 6-24 HRS)  PROTIME-INR  BILIRUBIN, DIRECT  LIPASE, BLOOD  BETA-HYDROXYBUTYRIC ACID  BLOOD GAS, ARTERIAL  I-STAT BETA HCG BLOOD, ED ([facility], [facility], [facility] ONLY)  TROPONIN I (HIGH SENSITIVITY)    EKG EKG Interpretation  Date/Time:  Friday December 15 2018 09:34:48 EDT Ventricular Rate:  108 PR Interval:    QRS Duration: 79 QT Interval:  360 QTC Calculation: 483 R Axis:   -35 Text Interpretation:  Sinus tachycardia Left axis deviation Anteroseptal infarct, age indeterminate Confirmed by  Virgel Manifold ([id number]) on 12/15/2018 9:40:08 AM   Radiology Dg Chest Portable 1 View  Result Date: 12/15/2018 CLINICAL DATA:   Chest pain radiating to the right side today. [name]. History of hypertension and diabetes. EXAM: PORTABLE CHEST 1 VIEW COMPARISON:  Radiographs 07/19/2018 07/17/2018. FINDINGS: 0949 hours. The heart size is stable at the upper limits of normal. The lungs are clear. There is no pleural effusion or pneumothorax. Degenerative changes are present throughout the spine. There is evidence of chronic rotator cuff tears bilaterally with asymmetric glenohumeral arthropathy on the right. Telemetry leads overlie the chest. IMPRESSION: Stable borderline heart size.  No active cardiopulmonary process. Electronically Signed   By: Richardean Sale M.D.   On: 12/15/2018 10:16    Procedures .Critical Care Performed by: Domenic Moras, PA-C Authorized by: Domenic Moras, PA-C   Critical care provider statement:    Critical care time (minutes):  45   Critical care was time spent personally by me on the following activities:  Discussions with consultants, evaluation of patient's response to treatment, examination of patient, ordering and performing treatments and interventions, ordering and review of laboratory studies, ordering and review of radiographic studies, pulse oximetry, re-evaluation of patient's condition, obtaining history from patient or surrogate and review of old charts   (including critical care time)  Medications Ordered in ED Medications  dextrose 5 %-0.45 % sodium chloride infusion (has no administration in time range)  insulin regular, human (MYXREDLIN) 100 units/ 100 mL infusion (5.4 Units/hr Intravenous New Bag/Given 12/15/18 1245)  sodium chloride 0.9 % bolus 1,000 mL (0 mLs Intravenous Stopped 12/15/18 1352)    And  sodium chloride 0.9 % bolus 1,000 mL (1,000 mLs Intravenous New Bag/Given 12/15/18 1353)    And  0.9 %  sodium chloride infusion (has no administration in time range)  sodium chloride flush (NS) 0.9 % injection 3 mL (3 mLs Intravenous Given 12/15/18 0926)  alum & mag  hydroxide-simeth (MAALOX/MYLANTA) 200-200-20 MG/5ML suspension 30 mL (30 mLs Oral Given 12/15/18 0949)    And  lidocaine (XYLOCAINE) 2 % viscous mouth solution 15 mL (15 mLs Oral Given 12/15/18 0950)  ondansetron (ZOFRAN) injection 4 mg (4 mg Intravenous Given 12/15/18 0944)  morphine 4 MG/ML injection 4 mg (4 mg Intravenous Given 12/15/18 0945)  haloperidol lactate (HALDOL) injection 5 mg (5 mg Intravenous Given 12/15/18 1245)  ondansetron (ZOFRAN) injection 4 mg (4 mg Intravenous Given 12/15/18 1247)  morphine 4 MG/ML injection 6 mg (6 mg Intravenous Given 12/15/18 1430)     Initial Impression / Assessment and Plan / ED Course  I have reviewed the triage vital signs and the nursing notes.  Pertinent labs & imaging results that were available during my care of the patient were reviewed by me and considered in my medical decision making (see chart for details).        BP (!) 153/61   Pulse (!) 107   Temp 97.9 F (36.6 C) (Oral)   Resp (!) 27   Ht _0  (1.676 m)   Wt (!) 150 kg   LMP 10/10/2012   SpO2 96%   BMI 53.37 kg/m    Final Clinical Impressions(s) / ED Diagnoses   Final diagnoses:  Intractable vomiting with nausea  Diabetic ketoacidosis without coma associated with type 2 diabetes mellitus (HCC)  Elevated troponin I level    ED Discharge Orders    None     9:45 AM Patient with history of diabetic gastroparesis, and fibromyalgia is here with  complaints of abdominal pain and chest pain that started last night.  She appears uncomfortable but nontoxic.  She does have intact pulses, low suspicion for dissection causing her symptoms.  She reports having similar symptoms in the past and related to gastroparesis.  Initial CBG was 599 per EMS.  Symptoms atypical of ACS.  Work-up initiated.  Will provide symptomatic treatment.  11:09 AM CBG is 609, patient has an anion gap of 18, UA is currently pending however additional glucose stabilizer to her close.  Evidence of mild AKI  with creatinine of 1.58.  Early white count of 12.2 and mildly elevated troponin of 21.  EKG without acute changes. Will obtain delta troponin, additional pain medication provide for patient. Care discussed with Dr. Wilson Singer.   2:46 PM Appreciate consultation from [facility] Medicine residents who agrees to see and admit pt for intractable nausea/vomiting, mild AKI, and mildly elevated troponin likely 2/2 demand ischemia.  Pt is aware and agrees with plan.     Domenic Moras, PA-C 12/15/18 1448    Virgel Manifold, MD 12/16/18 [phone]

## 2018-12-15 NOTE — Progress Notes (Signed)
Patient evaluated by me before 5 PM today. I have discussed management plan with the resident. I will cosign their H&P once it is completed. Please page [phone] for any question.

## 2018-12-15 NOTE — ED Triage Notes (Signed)
Patient was brought to the ER via EMS for chest pain that started around midnight. The patient stated her pain was 10/10 and has an allergy to aspirin, so two 0.4 SL nitroglycerins were administered and the pain decreased minimally. The patient has an 18 gauge IV in the left AC. The CBG taken by EMS was 599 and the patient presented with tachycardia on EMS monitor.

## 2018-12-15 NOTE — ED Notes (Signed)
ED TO INPATIENT HANDOFF REPORT  ED Nurse Name and Phone #: Harriette Bouillon [phone]  S Name/Age/Gender Deborah [name] 52 y.o. female Room/Bed: 018C/018C  Code Status   Code Status: Full Code  Home/SNF/Other Home Patient oriented to: self, place, time and situation Is this baseline? Yes   Triage Complete: Triage complete  Chief Complaint CP  Triage Note Patient was brought to the ER via EMS for chest pain that started around midnight. The patient stated her pain was 10/10 and has an allergy to aspirin, so two 0.4 SL nitroglycerins were administered and the pain decreased minimally. The patient has an 18 gauge IV in the left AC. The CBG taken by EMS was 599 and the patient presented with tachycardia on EMS monitor.    Allergies Allergies  Allergen Reactions  . Contrast Media [Iodinated Diagnostic Agents] Anaphylaxis    Cardiac arrest  . Diazepam [name]  . Isovue [Iopamidol] Anaphylaxis    Patient had seizure like activity and then code post 100 cc of isovue 300  . Lisinopril Anaphylaxis    Tongue and mouth swelling  . Penicillins Palpitations    Has patient had a PCN reaction causing immediate rash, facial/tongue/throat swelling, SOB or lightheadedness with hypotension: Yes, heart races Has patient had a PCN reaction causing severe rash involving mucus membranes or skin necrosis: No Has patient had a PCN reaction that required hospitalization: Yes  Has patient had a PCN reaction occurring within the last 10 years: No   . Acetaminophen Nausea Only and Other (See Comments)    Irritates stomach ulcer  Abdominal pain  . Tolmetin Nausea Only    Other reaction(s): Other (See Comments) ULCER  . Aspirin Other (See Comments)    Irritates stomach ulcer   . Food Hives and Swelling    Carrots, ketchup   . Nsaids Other (See Comments)    ULCER  . Tramadol Nausea And Vomiting    Level of Care/Admitting Diagnosis ED Disposition    ED Disposition Condition [facility]: [place] [[id number]]  Level of Care: Progressive [102]  Covid Evaluation: Asymptomatic Screening Protocol (No Symptoms)  Diagnosis: Gastroparesis [536.3.ICD-9-CM]  Admitting Physician: Marla Roe  Attending Physician: Andrena Mews T [[id number]]  Estimated length of stay: past midnight tomorrow  Certification:: I certify this patient will need inpatient services for at least 2 midnights  PT Class (Do Not Modify): Inpatient [101]  PT Acc Code (Do Not Modify): Private [1]       B Medical/Surgery History Past Medical History:  Diagnosis Date  . Acute back pain with sciatica, left   . Acute back pain with sciatica, right   . Anemia, unspecified   . Chest pain 12/2015  . Chronic pain   . Diabetes mellitus   . Esophageal reflux   . Fibromyalgia   . Gastric ulcer   . Gastroparesis   . Gout   . Hyperlipidemia   . Hypertension   . Lumbosacral stenosis   . Obesity   . Pneumonia   . [name]   . Stroke ([place]) 02/2011  . Vitamin B12 deficiency anemia    Past Surgical History:  Procedure Laterality Date  . CATARACT EXTRACTION  01/2014  . CHOLECYSTECTOMY       A IV Location/Drains/Wounds Patient Lines/Drains/Airways Status   Active Line/Drains/Airways    Name:   Placement date:   Placement time:   Site:   Days:   Peripheral IV 12/15/18  Left Antecubital   12/15/18    0926    Antecubital   less than 1   Incision (Closed) 06/18/16 Leg Right;Upper   06/18/16    0022     910          Intake/Output Last 24 hours  Intake/Output Summary (Last 24 hours) at 12/15/2018 1726 Last data filed at 12/15/2018 1616 Gross per 24 hour  Intake 2000 ml  Output -  Net 2000 ml    Labs/Imaging Results for orders placed or performed during the hospital encounter of 12/15/18 (from the past 48 hour(s))  CBC     Status: Abnormal   Collection Time: 12/15/18  9:45 AM  Result Value Ref Range   WBC 12.2 (H) 4.0 - 10.5 K/uL   RBC 3.83  (L) 3.87 - 5.11 MIL/uL   Hemoglobin 10.6 (L) 12.0 - 15.0 g/dL   HCT 34.6 (L) 36.0 - 46.0 %   MCV 90.3 80.0 - 100.0 fL   MCH 27.7 26.0 - 34.0 pg   MCHC 30.6 30.0 - 36.0 g/dL   RDW 14.6 11.5 - 15.5 %   Platelets 376 150 - 400 K/uL   nRBC 0.0 0.0 - 0.2 %    Comment: Performed at [facility], [place] [address], [place], [place] [address]  Protime-INR (order if Patient is taking Coumadin / Warfarin)     Status: None   Collection Time: 12/15/18  9:45 AM  Result Value Ref Range   Prothrombin Time 14.7 11.4 - 15.2 seconds   INR 1.2 0.8 - 1.2    Comment: (NOTE) INR goal varies based on device and disease states. Performed at [facility], [place] [address] [place], [place] [address]   Comprehensive metabolic panel     Status: Abnormal   Collection Time: 12/15/18  9:45 AM  Result Value Ref Range   Sodium 137 135 - 145 mmol/L   Potassium 4.0 3.5 - 5.1 mmol/L   Chloride 99 98 - 111 mmol/L   CO2 20 (L) 22 - 32 mmol/L   Glucose, Bld 609 (HH) 70 - 99 mg/dL    Comment: CRITICAL RESULT CALLED TO, READ BACK BY AND VERIFIED WITH: Kathaleen Maser [phone] [id number] WILDERK    BUN 15 6 - 20 mg/dL   Creatinine, Ser 1.58 (H) 0.44 - 1.00 mg/dL   Calcium 9.1 8.9 - 10.3 mg/dL   Total Protein 7.4 6.5 - 8.1 g/dL   Albumin 3.3 (L) 3.5 - 5.0 g/dL   AST 23 15 - 41 U/L   ALT 24 0 - 44 U/L   Alkaline Phosphatase 102 38 - 126 U/L   Total Bilirubin 0.8 0.3 - 1.2 mg/dL   GFR calc non Af Amer 37 (L) >60 mL/min   GFR calc Af Amer 43 (L) >60 mL/min   Anion gap 18 (H) 5 - 15    Comment: Performed at [facility], [place] [address] [place], [place] [address]  Bilirubin, direct     Status: None   Collection Time: 12/15/18  9:45 AM  Result Value Ref Range   Bilirubin, Direct <0.1 0.0 - 0.2 mg/dL    Comment: Performed at [facility] [address] [place], [place] [address]  Lipase, blood     Status: None   Collection Time: 12/15/18  9:45 AM  Result Value Ref Range   Lipase 28 11 - 51 U/L     Comment: Performed at [facility], [place]  [address], [place], [place] [address]  Troponin I (High Sensitivity)     Status: Abnormal   Collection Time: 12/15/18  9:45 AM  Result Value Ref Range   Troponin I (High Sensitivity) 21 (H) <18 ng/L    Comment: (NOTE) Elevated high sensitivity troponin I (hsTnI) values and significant  changes across serial measurements may suggest ACS but many other  chronic and acute conditions are known to elevate hsTnI results.  Refer to the Links section for chest pain algorithms and additional  guidance. Performed at [facility], [place] [address] [place] [address] [place], [place] [address]   I-Stat beta hCG blood, ED     Status: None   Collection Time: 12/15/18 10:21 AM  Result Value Ref Range   I-stat hCG, quantitative <5.0 <5 mIU/mL   Comment 3            Comment:   GEST. AGE      CONC.  (mIU/mL)   <=1 WEEK        5 - 50     2 WEEKS       50 - 500     3 WEEKS       100 - 10,000     4 WEEKS     1,000 - 30,000        FEMALE AND NON-PREGNANT FEMALE:     LESS THAN 5 mIU/mL   CBG monitoring, ED     Status: Abnormal   Collection Time: 12/15/18 12:38 PM  Result Value Ref Range   Glucose-Capillary >600 (HH) 70 - 99 mg/dL  Urinalysis, Routine w reflex microscopic     Status: Abnormal   Collection Time: 12/15/18  1:30 PM  Result Value Ref Range   Color, Urine STRAW (A) YELLOW   APPearance CLEAR CLEAR   Specific Gravity, Urine 1.026 1.005 - 1.030   pH 6.0 5.0 - 8.0   Glucose, UA >=500 (A) NEGATIVE mg/dL   Hgb urine dipstick MODERATE (A) NEGATIVE   Bilirubin Urine NEGATIVE NEGATIVE   Ketones, ur 5 (A) NEGATIVE mg/dL   Protein, ur >=300 (A) NEGATIVE mg/dL   Nitrite NEGATIVE NEGATIVE   Leukocytes,Ua NEGATIVE NEGATIVE   RBC / HPF 0-5 0 - 5 RBC/hpf   WBC, UA 0-5 0 - 5 WBC/hpf   Bacteria, UA NONE SEEN NONE SEEN   Squamous Epithelial / LPF 0-5 0 - 5    Comment: Performed at [facility], [address], [place], [place] [address]  CBG monitoring, ED      Status: Abnormal   Collection Time: 12/15/18  1:38 PM  Result Value Ref Range   Glucose-Capillary 538 (HH) 70 - 99 mg/dL   Comment 1 QC Due    Comment 2 Notify RN   CBG monitoring, ED     Status: Abnormal   Collection Time: 12/15/18  2:53 PM  Result Value Ref Range   Glucose-Capillary 485 (H) 70 - 99 mg/dL   Comment 1 Notify RN   I-STAT 7, (LYTES, BLD GAS, ICA, H+H)     Status: Abnormal   Collection Time: 12/15/18  3:46 PM  Result Value Ref Range   pH, Arterial 7.362 7.350 - 7.450   pCO2 arterial 39.8 32.0 - 48.0 mmHg   pO2, Arterial 68.0 (L) 83.0 - 108.0 mmHg   Bicarbonate 22.7 20.0 - 28.0 mmol/L   TCO2 24 22 - 32 mmol/L   O2 Saturation 93.0 %   Acid-base deficit 3.0 (H) 0.0 - 2.0 mmol/L   Sodium  141 135 - 145 mmol/L   Potassium 3.6 3.5 - 5.1 mmol/L   Calcium, Ion 1.24 1.15 - 1.40 mmol/L   HCT 33.0 (L) 36.0 - 46.0 %   Hemoglobin 11.2 (L) 12.0 - 15.0 g/dL   Patient temperature 98.0 F    Sample type ARTERIAL   CBG monitoring, ED     Status: Abnormal   Collection Time: 12/15/18  4:07 PM  Result Value Ref Range   Glucose-Capillary 438 (H) 70 - 99 mg/dL   Dg Chest Portable 1 View  Result Date: 12/15/2018 CLINICAL DATA:  Chest pain radiating to the right side today. [name]. History of hypertension and diabetes. EXAM: PORTABLE CHEST 1 VIEW COMPARISON:  Radiographs 07/19/2018 07/17/2018. FINDINGS: 0949 hours. The heart size is stable at the upper limits of normal. The lungs are clear. There is no pleural effusion or pneumothorax. Degenerative changes are present throughout the spine. There is evidence of chronic rotator cuff tears bilaterally with asymmetric glenohumeral arthropathy on the right. Telemetry leads overlie the chest. IMPRESSION: Stable borderline heart size.  No active cardiopulmonary process. Electronically Signed   By: Richardean Sale M.D.   On: 12/15/2018 10:16    Pending Labs Unresulted Labs (From admission, onward)    Start     Ordered   12/15/18 3716   Basic metabolic panel  STAT Now then every 4 hours ,   STAT     12/15/18 1607   12/15/18 1446  Beta-hydroxybutyric acid  Add-on,   AD     12/15/18 1445   12/15/18 1446  Blood gas, arterial  Once,   R     12/15/18 1445   12/15/18 1123  SARS CORONAVIRUS 2 (TAT 6-24 HRS) Nasopharyngeal Nasopharyngeal Swab  (Asymptomatic/Tier 2 Patients Labs)  Once,   STAT    Question Answer Comment  Is this test for diagnosis or screening Screening   Symptomatic for COVID-19 as defined by CDC No   Hospitalized for COVID-19 No   Admitted to ICU for COVID-19 No   Previously tested for COVID-19 No   Resident in a congregate (group) care setting No   Employed in healthcare setting No   Pregnant No      12/15/18 1122          Vitals/Pain Today's Vitals   12/15/18 1323 12/15/18 1330 12/15/18 1355 12/15/18 1443  BP:  (!) 153/61    Pulse:      Resp:  (!) 27    Temp:    97.9 F (36.6 C)  TempSrc:    Oral  SpO2:      Weight:      Height:      PainSc: Asleep  7      Isolation Precautions No active isolations  Medications Medications  dextrose 5 %-0.45 % sodium chloride infusion (has no administration in time range)  insulin regular, human (MYXREDLIN) 100 units/ 100 mL infusion (5.4 Units/hr Intravenous New Bag/Given 12/15/18 1245)  sodium chloride 0.9 % bolus 1,000 mL (0 mLs Intravenous Stopped 12/15/18 1352)    And  sodium chloride 0.9 % bolus 1,000 mL (0 mLs Intravenous Stopped 12/15/18 1616)    And  0.9 %  sodium chloride infusion ( Intravenous New Bag/Given 12/15/18 1616)  0.9 %  sodium chloride infusion (has no administration in time range)  dextrose 5 %-0.45 % sodium chloride infusion (has no administration in time range)  enoxaparin (LOVENOX) injection 40 mg (has no administration in time range)  potassium chloride 10 mEq in  100 mL IVPB (has no administration in time range)  sodium chloride flush (NS) 0.9 % injection 3 mL (3 mLs Intravenous Given 12/15/18 0926)  alum & mag hydroxide-simeth  (MAALOX/MYLANTA) 200-200-20 MG/5ML suspension 30 mL (30 mLs Oral Given 12/15/18 0949)    And  lidocaine (XYLOCAINE) 2 % viscous mouth solution 15 mL (15 mLs Oral Given 12/15/18 0950)  ondansetron (ZOFRAN) injection 4 mg (4 mg Intravenous Given 12/15/18 0944)  morphine 4 MG/ML injection 4 mg (4 mg Intravenous Given 12/15/18 0945)  haloperidol lactate (HALDOL) injection 5 mg (5 mg Intravenous Given 12/15/18 1245)  ondansetron (ZOFRAN) injection 4 mg (4 mg Intravenous Given 12/15/18 1247)  morphine 4 MG/ML injection 6 mg (6 mg Intravenous Given 12/15/18 1430)    Mobility walks with person assist Low fall risk   Focused Assessments Cardiac Assessment Handoff:  Cardiac Rhythm: Normal sinus rhythm Lab Results  Component Value Date   CKTOTAL 177 03/24/2010   CKMB 3.0 03/24/2010   TROPONINI <0.03 07/17/2018   Lab Results  Component Value Date   DDIMER 0.66 (H) 07/29/2017   Does the Patient currently have chest pain? Yes     R Recommendations: See Admitting Provider Note  Report given to:   Additional Notes:

## 2018-12-15 NOTE — Progress Notes (Signed)
JAHMIYA GUIDOTTI [id number]  Code Status: FULL  Admission Data: 12/15/2018 8:01 PM  Attending Provider: Gwendlyn Deutscher  PJK:[id number], Christia Reading, DO  Consults/ Treatment Team:   JAZZLYN HUIZENGA is a 53 y.o. female patient admitted from ED awake, alert - oriented X 4 - no acute distress noted. VSS - Blood pressure 123/82, pulse (!) 110, temperature 98.4 F (36.9 C), temperature source Oral, resp. rate (!) 26, height 5\' 6"  (1.676 m), weight (!) 150 kg, last menstrual period 10/10/2012, SpO2 93 %. no c/o shortness of breath, no c/o chest pain. Orientation to room, and floor completed with information packet given to patient/family.  Admission INP armband ID verified with patient/family, and in place.  SR up x 2, fall assessment complete, with patient and family able to verbalize understanding of risk associated with falls, and verbalized understanding to call nsg before up out of bed. Call light within reach, patient able to voice, and demonstrate understanding. Skin, clean-dry- intact without evidence of bruising, or skin tears. MASD noted to groin and under breasts. No evidence of skin break down noted on exam.  ?  Will cont to eval and treat per MD orders.  Melonie Florida, RN  12/15/2018 8:01 PM

## 2018-12-15 NOTE — Progress Notes (Signed)
FPTS Interim Progress Note  Received page from RN that patient had lost her IV and IV team had tried twice with ultrasound guided and was unable to get access.  Patient is requiring insulin drip and D5 overnight.  Last BMP showing anion gap close to 14.  As recorded CBG 206.  Patient is alert.  Will plan to get stat BMP to ensure anion gap is closed x2.  If close x2 will plan to transition to long-acting insulin, Lantus and then allow patient to eat in 2 hours. Will continue to monitor CBGs q1hr while on long acting insulin.   Paged Dr. Gwendlyn Deutscher to inform her of this update.  Caroline More, DO 12/15/2018, 9:56 PM PGY-3, [place] [facility] pager [phone]

## 2018-12-15 NOTE — H&P (Addendum)
[name] Admission History and Physical Service Pager: [phone]  Patient name: Deborah [name] Medical record number: [id number] Date of birth: [birth year] Age: 53 y.o. Gender: female  Primary Care Provider: [name], DO Consultants: None Code Status: Full Preferred Emergency Contact: Mom  Chief Complaint: vomiting  Assessment and Plan: Deborah [name] is a 53 y.o. female presenting with nausea and abdominal pain. PMH is significant for DM type II, gastroparesis, hypertension, CKD esophageal reflux.  Intractable vomiting likely secondary to gastroparesis Patient reports having NBNB emesis over the past 2 days with poor p.o. intake.  Vital signs significant for respiration rate 27, heart rate 114, blood pressure 153/61, oxygen saturation 96% on room air and afebrile.   On exam mucous membranes dry.  Diffuse abdominal tenderness right greater than left. Abdominal pain initiates mid lower abdomen that radiates across to both sides.  Labs significant for mild metabolic acidosis anion gap of 18, creatinine 1.58 increased from 1.40 from previous admission May 2020.  Serum glucose 609.  Considered gastroenteritis but unlikely given no diarrhea, no bloody stool and patient reports similar symptoms with gastroparesis flare.  Most likely cause vomiting is flareup of gastroparesis and elevated serum glucose.  Gastroparesis may have led to poor medication compliance and exacerbated this hyperglycemic episode.  Alternatively, hyperglycemia may have started the process and exacerbated her abdominal symptoms and likely gastroparesis episode.  Will admit and treat her hyperglycemia as below while we continue to assess her GI symptoms. -Admit to progressive, today Dr. Gwendlyn Deutscher -Treat hyperglycemia as below -NPO -IV hydration normal saline -Consider erythromycin or Reglan once she is taking p.o. -Morphine 2 mg IV every 4 as needed  Hyperglycemia with mild  increased anion gap Patient reports no basal insulin per week.  Glucose 609 on admission,  elevation anion gap 18.  pH 7.36, beta hydroxybutyrate 0.11 (within normal limits) urine ketones of 5.  On exam mucous membranes dry.  This appears to be an episode of hyperglycemia secondary to medication noncompliance.  Lab findings do not support DKA.  We will keep Deborah [name] on the insulin drip overnight and n.p.o. improve control of her blood glucose will likely improve GI symptoms noted above.  Low concern for HHS due to intact cognition and normal mental status. -Insulin drip started in the ED, continue to monitor -BMP every 4 hours -Normal saline to transition to D5 half-normal for CBG under 250 -Plan to transition to p.o. the morning of 10/10 -Monitor CBGs -N.P.O -Monitor electrolytes replace as needed  AKI likely secondary to dehydration Creatinine 1.58.  Baseline approximately 1.00- 1.82 -Monitor I's and O's -IV hydration as above -Avoid nephrotoxic medications   Chest pain secondary to vomiting Patient reports chest pain starting last night around 1230 while lying down, no association with exertion.  EMS treated with nitroglycerin sublingual x2 with no significant relief.  Vital signs significant for BP 153/61, heart rate 114.  Labs significant for troponin 21.  EKG shows sinus tach heart rate no apparent ST elevation.  Last echo 09/10/2017 at [facility] showing left ventricular systolic function mildly reduced with an EF 45 to 50%.  LV and regional wall motion abnormalities and basal to mid anterior and anterolateral wall hypokinesis which was suggestive of LAD stenosis.  Denies any current chest pain.  On exam pain producible on palpation.  Considering chest pain was not relieved with nitro x2 CAD unlikely and chest pain reproducible. -Cardiac monitor -EKG in a.m. -Trend troponins  Heart failure with  mid range ejection fraction, stable Last echo noted above.  Hypovolemic on exam this  admission.  Home medication includes metoprolol succinate, Lasix. -Consider restarting home medications once taking p.o.  Hypertension Blood pressure on admission elevated to 177/95.  Now decreasing with antiemetics and fluid resuscitation.  Home medication includes amlodipine, metoprolol.  Holding oral medications while on the trip. -Consider restarting home medications once taking p.o.  Anxiety Mildly anxious appearing on exam today.  Home medication includes duloxetine 30 mg daily, hydroxyzine 10 mg 3 times daily as needed as needed she reports she was recently switched to hydroxyzine from a benzodiazepine. -Restart anxiety medication once taking p.o.  Hyperlipidemia Last lipid panel 12/2018 shows total cholesterol 106, LDL 38. -Restart atorvastatin once taking p.o.  Gout Home medication includes allopurinol 100 mg daily.  No complaints of flares at this time. -Hold home meds  Sleep aid Reports taking melatonin 3 mg nightly for sleep. -Add melatonin once taking p.o.  FEN/GI:  -NPO  Prophylaxis:  -Lovenox  Disposition: Home when medically stable  History of Present Illness:  Deborah [name] is a 53 y.o. female presenting with nausea and vomiting.  Patient reports feeling unwell for the past couple of days.  Denies any fever, or sick contacts.  She began vomiting around 12:30 AM with associated chest pain that radiates to her right shoulder and right hip.  She was concerned about this chest pain so she called EMS.she was given nitroglycerin x2 lingual with some relief.  She states that this is the same pain she has when her gastroparesis starts to act up.  Has not been able to eat or drink much in the last couple of days.  Last took her medications 2 days ago.  Last took insulin today 20 units.  Has not had Lantus in 1 week.  She denies any increase in thirst or urination.  She endorses having some dizziness, but denies any headaches or visual changes.  Denies any shortness of  breath, but reports having a cough productive for green sputum.   In the ED she was tachycardic, heart rate range 105-114, blood pressure range 97-177/58- 95, tachypneic, RR 17-31.  Oxygen saturations 92 to 97% on room air.  She was given Haldol 5 mg IV x1, morphine 4 mg and 6 mg IV for a total of 10 mg, Zofran 4 mg IV x1.  Chest x-ray negative for active cardiopulmonary process. Labs significant for a glucose 609, anion gap 18, WBCs 12.2.  She was started on insulin IV and given saline 1 L bolus x2 and continued on normal saline at 125 mls/hr  No alcohol, no cigs, no MJ  Review Of Systems: Per HPI with the following additions:   Review of Systems  Constitutional: Negative for chills, fever and weight loss.  HENT: Positive for congestion. Negative for sore throat.   Eyes: Negative for blurred vision.  Respiratory: Positive for sputum production and shortness of breath. Negative for wheezing.   Cardiovascular: Positive for chest pain, palpitations and orthopnea.  Gastrointestinal: Positive for abdominal pain, heartburn, nausea and vomiting. Negative for constipation and diarrhea.  Genitourinary: Negative for dysuria.  Musculoskeletal: Negative for myalgias.  Neurological: Positive for dizziness. Negative for tremors and headaches.    Patient Active Problem List   Diagnosis Date Noted  . Vomiting 07/18/2018  . Abdominal pain 07/17/2018  . Hyperkalemia 07/17/2018  . Cardiac arrest ([place]) 11/29/2017  . Pelvic mass 11/29/2017  . Leukocytosis 11/29/2017  . Anxiety 11/29/2017  . Allergic reaction  to contrast media, initial encounter 11/28/2017  . Palliative care encounter   . Back pain 03/19/2017  . Stroke (cerebrum) ([place]) 03/19/2017  . GERD (gastroesophageal reflux disease) 03/19/2017  . Depression 03/19/2017  . Obesity   . Urinary tract infection 08/16/2016  . Normocytic normochromic anemia 08/16/2016  . Gastroparesis 08/16/2016  . Intractable nausea and vomiting 06/17/2016  .  Diabetic gastroparesis ([place]) 06/05/2016  . Gout 06/05/2016  . AKI (acute kidney injury) ([place]) 12/06/2015  . Chest pain 09/26/2015  . Hypokalemia 09/26/2015  . Hypomagnesemia 09/26/2015  . Nausea and vomiting 08/20/2015  . Gout flare 05/27/2015  . Lower abdominal pain 05/26/2015  . DKA (diabetic ketoacidoses) ([place]) 05/25/2015  . Uncontrolled type 2 diabetes mellitus with diabetic neuropathy, with long-term current use of insulin ([place]) 05/25/2015  . Dyslipidemia associated with type 2 diabetes mellitus ([place]) 05/25/2015  . CKD (chronic kidney disease), stage II 05/25/2015  . Essential hypertension, benign 09/28/2013    Past Medical History: Past Medical History:  Diagnosis Date  . Acute back pain with sciatica, left   . Acute back pain with sciatica, right   . Anemia, unspecified   . Chest pain 12/2015  . Chronic pain   . Diabetes mellitus   . Esophageal reflux   . Fibromyalgia   . Gastric ulcer   . Gastroparesis   . Gout   . Hyperlipidemia   . Hypertension   . Lumbosacral stenosis   . Obesity   . Pneumonia   . [name]   . Stroke ([place]) 02/2011  . Vitamin B12 deficiency anemia     Past Surgical History: Past Surgical History:  Procedure Laterality Date  . CATARACT EXTRACTION  01/2014  . CHOLECYSTECTOMY      Social History: Social History   Tobacco Use  . Smoking status: Never Smoker  . Smokeless tobacco: Never Used  Substance Use Topics  . Alcohol use: No  . Drug use: No   Additional social history: Lives alone Please also refer to relevant sections of EMR.  Family History: Family History  Problem Relation Age of Onset  . Diabetes Mother   . Diabetes Father   . Heart disease Father   . Diabetes Sister   . Congestive Heart Failure Sister 33  . Diabetes Brother      Allergies and Medications: Allergies  Allergen Reactions  . Contrast Media [Iodinated Diagnostic Agents] Anaphylaxis    Cardiac arrest  . Diazepam [name]  .  Isovue [Iopamidol] Anaphylaxis    Patient had seizure like activity and then code post 100 cc of isovue 300  . Lisinopril Anaphylaxis    Tongue and mouth swelling  . Penicillins Palpitations    Has patient had a PCN reaction causing immediate rash, facial/tongue/throat swelling, SOB or lightheadedness with hypotension: Yes, heart races Has patient had a PCN reaction causing severe rash involving mucus membranes or skin necrosis: No Has patient had a PCN reaction that required hospitalization: Yes  Has patient had a PCN reaction occurring within the last 10 years: No   . Acetaminophen Nausea Only and Other (See Comments)    Irritates stomach ulcer  Abdominal pain  . Tolmetin Nausea Only    Other reaction(s): Other (See Comments) ULCER  . Aspirin Other (See Comments)    Irritates stomach ulcer   . Food Hives and Swelling    Carrots, ketchup   . Nsaids Other (See Comments)    ULCER  . Tramadol Nausea And Vomiting   No current  facility-administered medications on file prior to encounter.    Current Outpatient Medications on File Prior to Encounter  Medication Sig Dispense Refill  . allopurinol (ZYLOPRIM) 100 MG tablet Take 1 tablet (100 mg total) by mouth daily. 30 tablet 0  . amLODipine (NORVASC) 10 MG tablet Take 1 tablet (10 mg total) by mouth daily. 30 tablet 2  . aspirin (ECOTRIN LOW STRENGTH) 81 MG EC tablet Take 1 tablet (81 mg total) by mouth daily. Swallow whole. 30 tablet 11  . atorvastatin (LIPITOR) 10 MG tablet Take 1 tablet (10 mg total) by mouth daily. 30 tablet 11  . blood glucose meter kit and supplies KIT Dispense based on patient and insurance preference. Use up to four times daily as directed. (FOR ICD-9 250.00, 250.01). 1 each 0  . cetirizine (ZYRTEC) 10 MG tablet Take 1 tablet (10 mg total) by mouth daily. 30 tablet 11  . DULoxetine (CYMBALTA) 30 MG capsule Take 1 capsule (30 mg total) by mouth 2 (two) times daily. 60 capsule 3  . furosemide (LASIX) 20 MG tablet  Take 1 tablet (20 mg total) by mouth daily. 30 tablet 3  . hydrOXYzine (ATARAX/VISTARIL) 10 MG tablet Take 1 tablet (10 mg total) by mouth 3 (three) times daily as needed. 30 tablet 0  . insulin glargine (LANTUS) 100 UNIT/ML injection Inject 0.45 mLs (45 Units total) into the skin at bedtime. 10 mL 11  . insulin lispro (HUMALOG) 100 UNIT/ML injection Inject 0.1 mLs (10 Units total) into the skin 3 (three) times daily. 10 mL 6  . Melatonin 3 MG TABS Take 2 tablets (6 mg total) by mouth at bedtime. 30 tablet 6  . methocarbamol (ROBAXIN) 500 MG tablet Take 500 mg by mouth 2 (two) times a day.    . metoCLOPramide (REGLAN) 10 MG tablet Take 1 tablet (10 mg total) by mouth 3 (three) times daily before meals. 90 tablet 2  . metoprolol succinate (TOPROL-XL) 25 MG 24 hr tablet Take 1 tablet (25 mg total) by mouth daily. 30 tablet 2  . Multiple Vitamin (MULTIVITAMIN WITH MINERALS) TABS tablet Take 1 tablet by mouth daily.    . nitroGLYCERIN (NITROSTAT) 0.4 MG SL tablet Place 1 tablet (0.4 mg total) under the tongue every 5 (five) minutes as needed for chest pain. 30 tablet 1  . pantoprazole (PROTONIX) 40 MG tablet Take 1 tablet (40 mg total) by mouth 2 (two) times daily. 60 tablet 1  . potassium chloride (K-DUR) 10 MEQ tablet Take 2 tablets (20 mEq total) by mouth daily for 7 days. (Patient not taking: Reported on 11/28/2017) 14 tablet 0    Objective: BP (!) 153/61   Pulse (!) 107   Temp 97.9 F (36.6 C) (Oral)   Resp (!) 27   Ht '5\' 6"'  (1.676 m)   Wt (!) 150 kg   LMP 10/10/2012   SpO2 96%   BMI 53.37 kg/m  Exam: General: Alert and oriented, morbidly obese female.  Slouching in bed uncomfortably.  Emesis bag in lab.  Eyes: PEERLA ENTM: No pharyengeal erythema, mucous membranes dry Neck: nontender Cardiovascular: RRR with no murmurs noted.  No rubs or gallops.  Weak radial pulse. Respiratory: CTA bilaterally, no crackles, no rhonchi .  Breathing comfortably on room air. Gastrointestinal: Obese,  soft, nontender bowel sounds present.  Diffuse abdominal pain MSK: Moves all extremities, trace lower extremity edema, warm, well-perfused Derm: Acanthosis nigricans Neuro: Alert and orientated x4 Psych: Behavior and speech appropriate to situation   Labs and Imaging:  CBC BMET  Recent Labs  Lab 12/15/18 0945  WBC 12.2*  HGB 10.6*  HCT 34.6*  PLT 376   Recent Labs  Lab 12/15/18 0945  NA 137  K 4.0  CL 99  CO2 20*  BUN 15  CREATININE 1.58*  GLUCOSE 609*  CALCIUM 9.1     EKG: My impression Sinus tachycardia no significant ST elevation  Dg Chest Portable 1 View  Result Date: 12/15/2018 CLINICAL DATA:  Chest pain radiating to the right side today. [name]. History of hypertension and diabetes. EXAM: PORTABLE CHEST 1 VIEW COMPARISON:  Radiographs 07/19/2018 07/17/2018. FINDINGS: 0949 hours. The heart size is stable at the upper limits of normal. The lungs are clear. There is no pleural effusion or pneumothorax. Degenerative changes are present throughout the spine. There is evidence of chronic rotator cuff tears bilaterally with asymmetric glenohumeral arthropathy on the right. Telemetry leads overlie the chest. IMPRESSION: Stable borderline heart size.  No active cardiopulmonary process. Electronically Signed   By: Richardean Sale M.D.   On: 12/15/2018 10:16    Carollee Leitz, MD 12/15/2018, 2:59 PM PGY-1, [place] Intern pager: [phone], text pages welcome  FPTS Upper-Level Resident Addendum   I have independently interviewed and examined the patient. I have discussed the above with the original author and agree with their documentation. My edits for correction/addition/clarification are in blue. Please see also any attending notes.    Matilde Haymaker MD PGY-2, [facility] 12/15/2018 9:19 PM  [place] Service pager: [phone] (text pages welcome through [place])

## 2018-12-15 NOTE — Progress Notes (Signed)
Patient's has one IV line and she's supposed to get D5% plus 0.45 NS due to her CBG level per protocol. Talked to the pharmacist to check compatibility on both medications  ( ie insulin drip and D 5%plus  0.45 NS) and she indicated both have not been tested yet, and to be on a safer side  I  did not run both together.  [employer] employees tried the IV with a doppler and still couldn't get 2nd IV site. Notified Dr. Rosita Fire and she  gave new orders as indicated in epic. Will continue to monitor.

## 2018-12-16 DIAGNOSIS — E1165 Type 2 diabetes mellitus with hyperglycemia: Secondary | ICD-10-CM

## 2018-12-16 DIAGNOSIS — Z794 Long term (current) use of insulin: Secondary | ICD-10-CM

## 2018-12-16 DIAGNOSIS — R778 Other specified abnormalities of plasma proteins: Secondary | ICD-10-CM

## 2018-12-16 DIAGNOSIS — R112 Nausea with vomiting, unspecified: Secondary | ICD-10-CM

## 2018-12-16 LAB — BASIC METABOLIC PANEL
Anion gap: 10 (ref 5–15)
Anion gap: 10 (ref 5–15)
Anion gap: 9 (ref 5–15)
Anion gap: 9 (ref 5–15)
BUN/Creatinine Ratio: 13 (ref 9–23)
BUN: 19 mg/dL (ref 6–20)
BUN: 18 mg/dL (ref 6–20)
BUN: 18 mg/dL (ref 6–20)
BUN: 21 mg/dL (ref 6–24)
BUN: 18 mg/dL (ref 6–20)
CO2: 24 mmol/L (ref 22–32)
CO2: 26 mmol/L (ref 22–32)
CO2: 20 mmol/L (ref 20–29)
CO2: 26 mmol/L (ref 22–32)
CO2: 26 mmol/L (ref 22–32)
Calcium: 8.7 mg/dL — ABNORMAL LOW (ref 8.9–10.3)
Calcium: 8.8 mg/dL — ABNORMAL LOW (ref 8.9–10.3)
Calcium: 8.6 mg/dL — ABNORMAL LOW (ref 8.9–10.3)
Calcium: 9.2 mg/dL (ref 8.7–10.2)
Calcium: 8.9 mg/dL (ref 8.9–10.3)
Chloride: 109 mmol/L (ref 98–111)
Chloride: 106 mmol/L (ref 98–111)
Chloride: 100 mmol/L (ref 96–106)
Chloride: 107 mmol/L (ref 98–111)
Chloride: 106 mmol/L (ref 98–111)
Creatinine, Ser: 1.81 mg/dL — ABNORMAL HIGH (ref 0.44–1.00)
Creatinine, Ser: 1.71 mg/dL — ABNORMAL HIGH (ref 0.44–1.00)
Creatinine, Ser: 1.64 mg/dL — ABNORMAL HIGH (ref 0.44–1.00)
Creatinine, Ser: 1.67 mg/dL — ABNORMAL HIGH (ref 0.57–1.00)
Creatinine, Ser: 1.6 mg/dL — ABNORMAL HIGH (ref 0.44–1.00)
GFR calc Af Amer: 36 mL/min — ABNORMAL LOW (ref >60)
GFR calc Af Amer: 39 mL/min — ABNORMAL LOW (ref >60)
GFR calc Af Amer: 40 mL/min/{1.73_m2} — ABNORMAL LOW (ref >59)
GFR calc Af Amer: 41 mL/min — ABNORMAL LOW (ref >60)
GFR calc Af Amer: 42 mL/min — ABNORMAL LOW (ref >60)
GFR calc non Af Amer: 31 mL/min — ABNORMAL LOW (ref >60)
GFR calc non Af Amer: 33 mL/min — ABNORMAL LOW (ref >60)
GFR calc non Af Amer: 34 mL/min/{1.73_m2} — ABNORMAL LOW (ref >59)
GFR calc non Af Amer: 35 mL/min — ABNORMAL LOW (ref >60)
GFR calc non Af Amer: 36 mL/min — ABNORMAL LOW (ref >60)
Glucose, Bld: 137 mg/dL — ABNORMAL HIGH (ref 70–99)
Glucose, Bld: 197 mg/dL — ABNORMAL HIGH (ref 70–99)
Glucose, Bld: 239 mg/dL — ABNORMAL HIGH (ref 70–99)
Glucose, Bld: 319 mg/dL — ABNORMAL HIGH (ref 70–99)
Glucose: 457 mg/dL — ABNORMAL HIGH (ref 65–99)
Potassium: 3.4 mmol/L — ABNORMAL LOW (ref 3.5–5.1)
Potassium: 3.2 mmol/L — ABNORMAL LOW (ref 3.5–5.1)
Potassium: 3.5 mmol/L (ref 3.5–5.1)
Potassium: 3.8 mmol/L (ref 3.5–5.2)
Potassium: 3.6 mmol/L (ref 3.5–5.1)
Sodium: 143 mmol/L (ref 135–145)
Sodium: 142 mmol/L (ref 135–145)
Sodium: 140 mmol/L (ref 134–144)
Sodium: 142 mmol/L (ref 135–145)
Sodium: 141 mmol/L (ref 135–145)

## 2018-12-16 LAB — GLUCOSE, CAPILLARY
Glucose-Capillary: 138 mg/dL — ABNORMAL HIGH (ref 70–99)
Glucose-Capillary: 147 mg/dL — ABNORMAL HIGH (ref 70–99)
Glucose-Capillary: 161 mg/dL — ABNORMAL HIGH (ref 70–99)
Glucose-Capillary: 171 mg/dL — ABNORMAL HIGH (ref 70–99)
Glucose-Capillary: 186 mg/dL — ABNORMAL HIGH (ref 70–99)
Glucose-Capillary: 260 mg/dL — ABNORMAL HIGH (ref 70–99)
Glucose-Capillary: 270 mg/dL — ABNORMAL HIGH (ref 70–99)
Glucose-Capillary: 278 mg/dL — ABNORMAL HIGH (ref 70–99)

## 2018-12-16 LAB — MRSA PCR SCREENING: MRSA by PCR: NEGATIVE

## 2018-12-16 LAB — SPECIMEN STATUS REPORT

## 2018-12-16 MED ORDER — ACETAMINOPHEN 325 MG PO TABS: 650.0000 mg | ORAL_TABLET | Freq: Four times a day (QID) | ORAL | Status: DC | PRN

## 2018-12-16 MED ORDER — PANTOPRAZOLE SODIUM 40 MG PO TBEC
40.0000 mg | DELAYED_RELEASE_TABLET | Freq: Two times a day (BID) | ORAL | Status: DC
  Administered 2018-12-16: 40 mg via ORAL
  Filled 2018-12-16: qty 1

## 2018-12-16 MED ORDER — METOPROLOL SUCCINATE ER 25 MG PO TB24
25.0000 mg | ORAL_TABLET | Freq: Every day | ORAL | Status: DC
  Administered 2018-12-16: 25 mg via ORAL
  Filled 2018-12-16: qty 1

## 2018-12-16 MED ORDER — MORPHINE SULFATE (PF) 2 MG/ML IV SOLN: 2.0000 mg | Freq: Once | INTRAVENOUS | Status: DC

## 2018-12-16 MED ORDER — INSULIN GLARGINE 100 UNIT/ML ~~LOC~~ SOLN
35.0000 [IU] | SUBCUTANEOUS | Status: DC
  Filled 2018-12-16 (×2): qty 0.35

## 2018-12-16 MED ORDER — INSULIN ASPART 100 UNIT/ML ~~LOC~~ SOLN
0.0000 [IU] | Freq: Three times a day (TID) | SUBCUTANEOUS | Status: DC
  Administered 2018-12-16: 8 [IU] via SUBCUTANEOUS

## 2018-12-16 MED ORDER — METOCLOPRAMIDE HCL 10 MG PO TABS
10.0000 mg | ORAL_TABLET | Freq: Three times a day (TID) | ORAL | Status: DC
  Administered 2018-12-16: 10 mg via ORAL
  Filled 2018-12-16: qty 1

## 2018-12-16 MED ORDER — DULOXETINE HCL 30 MG PO CPEP
30.0000 mg | ORAL_CAPSULE | Freq: Two times a day (BID) | ORAL | Status: DC
  Administered 2018-12-16: 30 mg via ORAL
  Filled 2018-12-16: qty 1

## 2018-12-16 MED ORDER — ATORVASTATIN CALCIUM 10 MG PO TABS
10.0000 mg | ORAL_TABLET | Freq: Every day | ORAL | Status: DC
  Administered 2018-12-16: 10 mg via ORAL
  Filled 2018-12-16: qty 1

## 2018-12-16 MED ORDER — DICLOFENAC SODIUM 1 % TD GEL
4.0000 g | Freq: Four times a day (QID) | TRANSDERMAL | Status: DC | PRN
  Filled 2018-12-16: qty 100

## 2018-12-16 MED ORDER — HYDROXYZINE HCL 10 MG PO TABS
10.0000 mg | ORAL_TABLET | Freq: Three times a day (TID) | ORAL | Status: DC | PRN
  Filled 2018-12-16: qty 1

## 2018-12-16 NOTE — Progress Notes (Signed)
Patient agreed to take the Lantus after Dr. Rosita Fire had a conversation with her. Will continue to monitor.Insulin still running and it will be stopped in 2 hrs.

## 2018-12-16 NOTE — Progress Notes (Signed)
FPTS Interim Progress Note  Received page from nurse that patient is complaining of 10/10 abdominal pain.  Discussed pain with patient.  She states it is the same pain she had in the ED.  She states that it is lower abdominal pain.  She denies any new vomiting.  Does report that she is having some nausea but has not received any medication as it is not bad.  Patient states that she has slept all day and has not received any pain medication.  She says that she has an allergy to Tylenol that I offered her earlier and so she cannot take this.  When I asked her what other allergies she had she was able to tell me Tylenol, lisinopril and aspirin.  Then she told me to check her chart as she is allergic to "a lot of pain medications".  She states the only thing that worked for her was morphine that she was given in the ED.  She said that Dilaudid also helps her with her pain.    When asked if patient could tolerate any p.o. medications similar to the IV morphine patient became verbally abusive and started yelling at me.  She stated that I did not understand her pain and that because I was not the ED provider that is why did not want to give her any pain relief.  Patient stated that she could not take the p.o. medications because they do not work as fast and that she cannot stand the pain anymore.  Instructed patient that it was not appropriate for her to speak to me in this manner.  I agreed to give her morphine 2mg  x1, at which point patient continued to yell at me. If she refuses any further p.o. pain medications, she will not receive any further IV.    Deborah [name], [place], DO 12/16/2018, 8:35 PM PGY-3, [place] [facility] Service pager [phone]

## 2018-12-16 NOTE — Progress Notes (Signed)
Inpatient Diabetes Program Recommendations  AACE/ADA: New Consensus Statement on Inpatient Glycemic Control (2015)  Target Ranges:  Prepandial:   less than 140 mg/dL      Peak postprandial:   less than 180 mg/dL (1-2 hours)      Critically ill patients:  140 - 180 mg/dL   Lab Results  Component Value Date   GLUCAP 278 (H) 12/16/2018   HGBA1C 9.5 (H) 12/13/2018    Review of Glycemic Control  Diabetes history: DM2 Outpatient Diabetes medications: Lantus 45 units + Humalog 10 units tid meal coverage Current orders for Inpatient glycemic control: Lantus 30 units + Novolog sensitive tid + hs 0-5 units  Inpatient Diabetes Program Recommendations:   Spoke with patient via phone (DM coordinator working remotely). Patient states when she was discharged home from the nursing home did not include Lantus in the discharge medications. Patient states she doesn't need additional information regarding diabetes or insulin but needs - prescriptions -Lantus pen [phone] -Humalog pen #[id number] -Pen [place] #[phone] -glucose meter and strips #[id number]  Thank you, Nani Gasser. Shaquille Janes, RN, MSN, CDE  Diabetes Coordinator Inpatient Glycemic Control Team Team Pager [phone] (8am-5pm) 12/16/2018 10:58 AM

## 2018-12-16 NOTE — Progress Notes (Signed)
MEWS 2/yellow r/t HR & RR. Not an acute change. RR is in normal limits but fluctuates making MEWS 1 or 2. No acute distress noted. Will continue to monitor.     12/16/18 1210  Vitals  BP (!) 135/101  MAP (mmHg) 111  Pulse Rate (!) 109  ECG Heart Rate (!) 109  Resp 13  Oxygen Therapy  SpO2 97 %  O2 Device Room Air  MEWS Score  MEWS RR 1  MEWS Pulse 1  MEWS Systolic 0  MEWS LOC 0  MEWS Temp 0  MEWS Score 2  MEWS Score Color Yellow  MEWS Assessment  Is this an acute change? No

## 2018-12-16 NOTE — Progress Notes (Signed)
Family Medicine Teaching Service Daily Progress Note Intern Pager: [phone]  Patient name: Deborah [name] Medical record number: [id number] Date of birth: 03-Mar-1966 Age: 53 y.o. Gender: female  Primary Care Provider: [name], DO Consultants: none Code Status: full  Pt Overview and Major Events to Date:  10/9 - admitted for hyperglycemia  Assessment and Plan: Deborah [name] is a 53 y.o. female presenting with nausea and abdominal pain. PMH is significant for DM type II, gastroparesis, hypertension, CKD esophageal reflux.  Intractable vomiting likely secondary to gastroparesis Improved since CBGs more improved. Still with some nausea overnight but only with one episode of vomiting.  Vitals stable overnight and able to transition off of insulin drip early this morning. Appears euvolemic on exam. Has not yet eaten this am. -Treat hyperglycemia as below - carb modified diet -IV hydration normal saline  Hyperglycemia with mild increased anion gap Improved. Able to transition to subcutaneous insulin early this am with AG closed x2.  -am BMP -Monitor CBGs -Monitor electrolytes replace as needed  AKI likely secondary to dehydration Creatinine 1.58>1.81>1.60 this am.  Unknown baseline, appears to be ~1.00. Will continue IVF until more fully taking PO. -Monitor I's and O's - NS mIVF -Avoid nephrotoxic medications   Chest pain secondary to vomiting, resolved Likely secondary to vomiting. EKG without ischemic changes. Troponin leak observed due to demand, will continue to hydrate. -Cardiac monitor  Heart failure with mid range ejection fraction, stable Home medication includes metoprolol succinate, Lasix.  -will restart home metoprolol today.  Hypertension Normotensive.  Home medication includes amlodipine, metoprolol.   - will restart home metoprolol  Anxiety Home medication includes duloxetine 30 mg daily, hydroxyzine 10 mg 3 times daily as needed as  needed she reports she was recently switched to hydroxyzine from a benzodiazepine. -will restart home meds  Hyperlipidemia Last lipid panel 12/2018 shows total cholesterol 106, LDL 38. - will restart home statin  Gout Home medication includes allopurinol 100 mg daily.  No complaints of flares at this time. -Hold home meds  Sleep aid Reports taking melatonin 3 mg nightly for sleep.  FEN/GI: carb modified Prophylaxis: Lovenox  Disposition: continue inpt management of intractable vomiting  Subjective:  Feels improved this am, still a bit nauseous.  Objective: Temp:  [97.9 F (36.6 C)-98.4 F (36.9 C)] 98.2 F (36.8 C) (10/10 0756) Pulse Rate:  [104-116] 104 (10/10 0325) Resp:  [12-34] 18 (10/10 0850) BP: (97-177)/(58-103) 123/61 (10/10 0756) SpO2:  [90 %-98 %] 92 % (10/10 0756) Physical Exam: General: morbidly obese female, lying in bed, in NAD Cardiovascular: RRR Respiratory: CTAB Abdomen: obese abdomen, TTP diffusely, +BS Extremities: warm and well perfused, no edema  Laboratory: Recent Labs  Lab 12/13/18 0000 12/15/18 0945 12/15/18 1546  WBC 8.3 12.2*  --   HGB 10.0* 10.6* 11.2*  HCT 31.9* 34.6* 33.0*  PLT 372 376  --    Recent Labs  Lab 12/15/18 0945  12/16/18 0014 12/16/18 0410 12/16/18 0702  NA 137   < > 143 142 142  K 4.0   < > 3.4* 3.2* 3.5  CL 99   < > 109 106 107  CO2 20*   < > 24 26 26   BUN 15   < > 19 18 18   CREATININE 1.58*   < > 1.81* 1.71* 1.64*  CALCIUM 9.1   < > 8.7* 8.8* 8.6*  PROT 7.4  --   --   --   --   BILITOT 0.8  --   --   --   --  ALKPHOS 102  --   --   --   --   ALT 24  --   --   --   --   AST 23  --   --   --   --   GLUCOSE 609*   < > 137* 197* 239*   < > = values in this interval not displayed.    Imaging/Diagnostic Tests: No results found.  Rory Percy, DO 12/16/2018, 9:29 AM PGY-3, [place] Intern pager: [phone], text pages welcome

## 2018-12-16 NOTE — Progress Notes (Signed)
Patient refused the Morphine the provider ordered and she's threatening to leave AMA.

## 2018-12-16 NOTE — Discharge Summary (Addendum)
[facility] Discharge Summary  Patient name: Deborah [name] Medical record number: [id number] Date of birth: 1965-08-07 Age: 53 y.o. Gender: female Date of Admission: 12/15/2018  Date of Discharge: 3 Admitting Physician: Kinnie Feil, MD  Primary Care Provider: [name], DO Consultants: none  Indication for Hospitalization: nausea and abdominal pain  Discharge Diagnoses/Problem List:  Intractable vomiting likely secondary to gastroparesis Poorly controlled T2DM AKI (resolved) on CKD stage III HFmrEF Hypertension H/o CVA Anxiety Hyperlipidemia Gout OSA Morbid Obesity  Disposition: left AMA  Discharge Condition: stable  Discharge Exam:  Per Dr. Ky Barban on day of discharge General: morbidly obese female, lying in bed, in NAD Cardiovascular: RRR Respiratory: CTAB Abdomen: obese abdomen, TTP diffusely, +BS Extremities: warm and well perfused, no edema  Brief Hospital Course:  Deborah [name] is a 53 y.o. female who presented with nausea/vomiting and abdominal pain.  Patient was found to have hyperglycemia with glucose of 609 on admission.  Patient not in DKA or HHS, but was started on insulin gtt. believed that patient's nausea and abdominal pain were second to her longstanding gastroparesis due to poorly controlled diabetes.  Patient also had a mild AKI on CKD stage III on arrival that was attributed to dehydration. She received fluids and her AKI that she had on admission improved with Cr of 1.81>1.6. The following day patient was transitioned to subcutaneous insulin with Lantus 35 units along with sliding scale. Patient received 16 mg of morphine prior to being mated to the floor.  On arrival to the floor patient requested more IV morphine and refused all p.o. medications.  She then left AMA refusing the IV morphine that was ordered when patient was offered p.o. medications first. Patient left AGAINST MEDICAL ADVICE without  education on amount of insulin that she should be taking.   Issues for Follow Up:  1. Patient needs CPAP. 2. Please follow-up with patient's diabetes as she is poorly controlled.   3. Patient may benefit from Reglan for gastroparesis or GI outpatient if symptoms have not improved.  Significant Procedures: none  Significant Labs and Imaging:  Recent Labs  Lab 12/13/18 0000 12/15/18 0945 12/15/18 1546  WBC 8.3 12.2*  --   HGB 10.0* 10.6* 11.2*  HCT 31.9* 34.6* 33.0*  PLT 372 376  --    Recent Labs  Lab 12/13/18 0000 12/15/18 0945  12/15/18 2159 12/16/18 0014 12/16/18 0410 12/16/18 0702 12/16/18 1150  NA  --  137   < > 143 143 142 142 141  K  --  4.0   < > 3.3* 3.4* 3.2* 3.5 3.6  CL  --  99   < > 107 109 106 107 106  CO2  --  20*   < > 24 24 26 26 26   GLUCOSE  --  609*   < > 196* 137* 197* 239* 319*  BUN  --  15   < > 17 19 18 18 18   CREATININE  --  1.58*   < > 1.62* 1.81* 1.71* 1.64* 1.60*  CALCIUM  --  9.1   < > 8.8* 8.7* 8.8* 8.6* 8.9  MG 1.3*  --   --  1.6*  --   --   --   --   ALKPHOS  --  102  --   --   --   --   --   --   AST  --  23  --   --   --   --   --   --  ALT  --  24  --   --   --   --   --   --   ALBUMIN  --  3.3*  --   --   --   --   --   --    < > = values in this interval not displayed.     Results/Tests Pending at Time of Discharge: none  Discharge Medications:   Patient left AGAINST MEDICAL ADVICE  Discharge Instructions: Please refer to Patient Instructions section of EMR for full details.  Patient was counseled important signs and symptoms that should prompt return to medical care, changes in medications, dietary instructions, activity restrictions, and follow up appointments.   Follow-Up Appointments:   Bowman Higbie, [place], DO 12/16/2018, 9:02 PM PGY-3, [facility]

## 2018-12-16 NOTE — Progress Notes (Signed)
Patient complaining of abdominal pain of 7/10 on a pain scale. Notified Shirley [place] and received an order for Tylenol  PRN. Notified patient of the  new order for Tylenol and she told this writer she wants. Morphine IV and that  she's allergic to Tylenol. Called the provider and informed her of the patient's prefernce for Morphine. The provider stated she was  going to look into it. Will continue to monitor.

## 2018-12-16 NOTE — Progress Notes (Signed)
The patient is very furious and decided to go AMA. She has already signed the [facility] form. Dr. [place] is aware.

## 2018-12-16 NOTE — Progress Notes (Signed)
MEWS score 2/yellow r/t RR & HR. This not an acute change. RR is normal limit. Will continue to monitor.     12/16/18 0756  Vitals  Temp 98.2 F (36.8 C)  Temp Source Oral  BP 123/61  MAP (mmHg) 77  BP Method Automatic  Patient Position (if appropriate) Lying  ECG Heart Rate (!) 109  Resp 12  Oxygen Therapy  SpO2 92 %  MEWS Score  MEWS RR 1  MEWS Pulse 1  MEWS Systolic 0  MEWS LOC 0  MEWS Temp 0  MEWS Score 2  MEWS Score Color Yellow  MEWS Assessment  Is this an acute change? No

## 2018-12-16 NOTE — Progress Notes (Signed)
FPTS Interim Progress Note  Receieved page that patient was refusing subcutaneous insulin due to concern that it would cause hypoglycemia. Reported to the bedside in order to discuss this with patient after speaking with pharmacy to confirm safe dosing.  Upon arrival to the patient's room, patient is lying in bed and tired appearing.  Patient went on to report that she did not want to take the long-acting insulin as she was concerned that it would "bottom me out".  I assured patient that I had confirmed the safety of administering 30 units of insulin after stopping her insulin drip and how it would not take effect for 6 hours adding that her no longer being on the insulin drip would mean that she would have several hours of no insulin coverage to control her blood sugars.  Also reassured patient that we would be regularly monitoring her CBG levels.  Patient verbalized understanding of this and decided that she would take the subcutaneous doses of insulin as long as it did not bottom her blood glucose levels.  I again told the patient that her blood glucose levels would be monitored her blood sugar regularly overnight.  Patient seemed reassured by this and summarized her understanding of the treatment plan before agreeing to take the insulin units. I relayed this information to patient's assigned nurse.    Stark Klein, MD 12/16/2018, 1:15 AM PGY-1, [facility] pager [phone]

## 2018-12-16 NOTE — Progress Notes (Signed)
New order received from Dr. Rosita Fire to proceed to give patient food to eat since she cannot get Dextosed indusion and aslo to give lantus 30 units. Patient has refused the Lantus, stated it will bottom her blood sugar down, Notified Dr. Rosita Fire and she indicated she is coming to the bedside to talk to the patient. Patient is complaining on basically everything ie lab draws CBG checks and other prescribed regimen. She wants to be left alone to sleep.

## 2018-12-17 ENCOUNTER — Other Ambulatory Visit: Payer: Self-pay

## 2018-12-17 ENCOUNTER — Emergency Department (HOSPITAL_COMMUNITY): Payer: Medicare Other

## 2018-12-17 ENCOUNTER — Encounter (HOSPITAL_COMMUNITY): Payer: Self-pay

## 2018-12-17 ENCOUNTER — Inpatient Hospital Stay (HOSPITAL_COMMUNITY)
Admission: EM | Admit: 2018-12-17 | Discharge: 2018-12-19 | DRG: 074 | Disposition: A | Discharge status: Home / Self Care | Payer: Medicare Other | Attending: Internal Medicine | Admitting: Internal Medicine

## 2018-12-17 DIAGNOSIS — I129 Hypertensive chronic kidney disease with stage 1 through stage 4 chronic kidney disease, or unspecified chronic kidney disease: Secondary | ICD-10-CM | POA: Diagnosis present

## 2018-12-17 DIAGNOSIS — K219 Gastro-esophageal reflux disease without esophagitis: Secondary | ICD-10-CM | POA: Diagnosis present

## 2018-12-17 DIAGNOSIS — Z880 Allergy status to penicillin: Secondary | ICD-10-CM

## 2018-12-17 DIAGNOSIS — Z8711 Personal history of peptic ulcer disease: Secondary | ICD-10-CM

## 2018-12-17 DIAGNOSIS — E1122 Type 2 diabetes mellitus with diabetic chronic kidney disease: Secondary | ICD-10-CM | POA: Diagnosis present

## 2018-12-17 DIAGNOSIS — K760 Fatty (change of) liver, not elsewhere classified: Secondary | ICD-10-CM | POA: Diagnosis present

## 2018-12-17 DIAGNOSIS — Z8673 Personal history of transient ischemic attack (TIA), and cerebral infarction without residual deficits: Secondary | ICD-10-CM

## 2018-12-17 DIAGNOSIS — E785 Hyperlipidemia, unspecified: Secondary | ICD-10-CM | POA: Diagnosis present

## 2018-12-17 DIAGNOSIS — M797 Fibromyalgia: Secondary | ICD-10-CM | POA: Diagnosis present

## 2018-12-17 DIAGNOSIS — N1832 Chronic kidney disease, stage 3b: Secondary | ICD-10-CM | POA: Diagnosis present

## 2018-12-17 DIAGNOSIS — N184 Chronic kidney disease, stage 4 (severe): Secondary | ICD-10-CM | POA: Diagnosis present

## 2018-12-17 DIAGNOSIS — R112 Nausea with vomiting, unspecified: Secondary | ICD-10-CM | POA: Diagnosis not present

## 2018-12-17 DIAGNOSIS — I493 Ventricular premature depolarization: Secondary | ICD-10-CM | POA: Diagnosis present

## 2018-12-17 DIAGNOSIS — N182 Chronic kidney disease, stage 2 (mild): Secondary | ICD-10-CM | POA: Diagnosis present

## 2018-12-17 DIAGNOSIS — K3184 Gastroparesis: Secondary | ICD-10-CM | POA: Diagnosis not present

## 2018-12-17 DIAGNOSIS — Z794 Long term (current) use of insulin: Secondary | ICD-10-CM

## 2018-12-17 DIAGNOSIS — Z8249 Family history of ischemic heart disease and other diseases of the circulatory system: Secondary | ICD-10-CM

## 2018-12-17 DIAGNOSIS — Z833 Family history of diabetes mellitus: Secondary | ICD-10-CM

## 2018-12-17 DIAGNOSIS — Z79899 Other long term (current) drug therapy: Secondary | ICD-10-CM

## 2018-12-17 DIAGNOSIS — E1143 Type 2 diabetes mellitus with diabetic autonomic (poly)neuropathy: Principal | ICD-10-CM | POA: Diagnosis present

## 2018-12-17 DIAGNOSIS — Z9049 Acquired absence of other specified parts of digestive tract: Secondary | ICD-10-CM

## 2018-12-17 DIAGNOSIS — N179 Acute kidney failure, unspecified: Secondary | ICD-10-CM | POA: Diagnosis present

## 2018-12-17 DIAGNOSIS — Z886 Allergy status to analgesic agent status: Secondary | ICD-10-CM

## 2018-12-17 DIAGNOSIS — E119 Type 2 diabetes mellitus without complications: Secondary | ICD-10-CM

## 2018-12-17 DIAGNOSIS — E876 Hypokalemia: Secondary | ICD-10-CM | POA: Diagnosis present

## 2018-12-17 DIAGNOSIS — Z91018 Allergy to other foods: Secondary | ICD-10-CM

## 2018-12-17 DIAGNOSIS — Z20828 Contact with and (suspected) exposure to other viral communicable diseases: Secondary | ICD-10-CM | POA: Diagnosis present

## 2018-12-17 DIAGNOSIS — R739 Hyperglycemia, unspecified: Secondary | ICD-10-CM | POA: Insufficient documentation

## 2018-12-17 DIAGNOSIS — I10 Essential (primary) hypertension: Secondary | ICD-10-CM | POA: Diagnosis present

## 2018-12-17 DIAGNOSIS — R0902 Hypoxemia: Secondary | ICD-10-CM | POA: Diagnosis present

## 2018-12-17 DIAGNOSIS — Z888 Allergy status to other drugs, medicaments and biological substances status: Secondary | ICD-10-CM

## 2018-12-17 DIAGNOSIS — Z6841 Body Mass Index (BMI) 40.0 and over, adult: Secondary | ICD-10-CM

## 2018-12-17 DIAGNOSIS — E1165 Type 2 diabetes mellitus with hyperglycemia: Secondary | ICD-10-CM | POA: Diagnosis present

## 2018-12-17 LAB — CBC WITH DIFFERENTIAL/PLATELET
Abs Immature Granulocytes: 0.04 10*3/uL (ref 0.00–0.07)
Basophils Absolute: 0.1 10*3/uL (ref 0.0–0.1)
Basophils Relative: 1 %
Eosinophils Absolute: 0.1 10*3/uL (ref 0.0–0.5)
Eosinophils Relative: 1 %
HCT: 37 % (ref 36.0–46.0)
Hemoglobin: 10.9 g/dL — ABNORMAL LOW (ref 12.0–15.0)
Immature Granulocytes: 0 %
Lymphocytes Relative: 20 %
Lymphs Abs: 2.2 10*3/uL (ref 0.7–4.0)
MCH: 27 pg (ref 26.0–34.0)
MCHC: 29.5 g/dL — ABNORMAL LOW (ref 30.0–36.0)
MCV: 91.8 fL (ref 80.0–100.0)
Monocytes Absolute: 0.5 10*3/uL (ref 0.1–1.0)
Monocytes Relative: 5 %
Neutro Abs: 8.4 10*3/uL — ABNORMAL HIGH (ref 1.7–7.7)
Neutrophils Relative %: 73 %
Platelets: 425 10*3/uL — ABNORMAL HIGH (ref 150–400)
RBC: 4.03 MIL/uL (ref 3.87–5.11)
RDW: 15.1 % (ref 11.5–15.5)
WBC: 11.4 10*3/uL — ABNORMAL HIGH (ref 4.0–10.5)
nRBC: 0 % (ref 0.0–0.2)

## 2018-12-17 LAB — COMPREHENSIVE METABOLIC PANEL
ALT: 24 U/L (ref 0–44)
AST: 26 U/L (ref 15–41)
Albumin: 3.6 g/dL (ref 3.5–5.0)
Alkaline Phosphatase: 103 U/L (ref 38–126)
Anion gap: 12 (ref 5–15)
BUN: 19 mg/dL (ref 6–20)
CO2: 23 mmol/L (ref 22–32)
Calcium: 9.1 mg/dL (ref 8.9–10.3)
Chloride: 103 mmol/L (ref 98–111)
Creatinine, Ser: 1.6 mg/dL — ABNORMAL HIGH (ref 0.44–1.00)
GFR calc Af Amer: 42 mL/min — ABNORMAL LOW (ref >60)
GFR calc non Af Amer: 36 mL/min — ABNORMAL LOW (ref >60)
Glucose, Bld: 319 mg/dL — ABNORMAL HIGH (ref 70–99)
Potassium: 3.3 mmol/L — ABNORMAL LOW (ref 3.5–5.1)
Sodium: 138 mmol/L (ref 135–145)
Total Bilirubin: 0.5 mg/dL (ref 0.3–1.2)
Total Protein: 8 g/dL (ref 6.5–8.1)

## 2018-12-17 LAB — URINALYSIS, ROUTINE W REFLEX MICROSCOPIC
Bilirubin Urine: NEGATIVE
Glucose, UA: >=500 mg/dL — AB
Ketones, ur: NEGATIVE mg/dL
Leukocytes,Ua: NEGATIVE
Nitrite: NEGATIVE
Protein, ur: >=300 mg/dL — AB
Specific Gravity, Urine: 1.022 (ref 1.005–1.030)
pH: 5 (ref 5.0–8.0)

## 2018-12-17 LAB — CBG MONITORING, ED
Glucose-Capillary: 302 mg/dL — ABNORMAL HIGH (ref 70–99)
Glucose-Capillary: 282 mg/dL — ABNORMAL HIGH (ref 70–99)

## 2018-12-17 LAB — LIPASE, BLOOD: Lipase: 23 U/L (ref 11–51)

## 2018-12-17 LAB — TROPONIN I (HIGH SENSITIVITY)
Troponin I (High Sensitivity): 22 ng/L — ABNORMAL HIGH (ref <18)
Troponin I (High Sensitivity): 25 ng/L — ABNORMAL HIGH (ref <18)

## 2018-12-17 MED ORDER — INSULIN GLARGINE 100 UNIT/ML ~~LOC~~ SOLN
30.0000 [IU] | Freq: Every day | SUBCUTANEOUS | Status: DC
  Administered 2018-12-17 – 2018-12-18 (×2): 30 [IU] via SUBCUTANEOUS
  Filled 2018-12-17 (×3): qty 0.3

## 2018-12-17 MED ORDER — MORPHINE SULFATE (PF) 2 MG/ML IV SOLN
1.0000 mg | INTRAVENOUS | Status: DC | PRN
  Administered 2018-12-17 – 2018-12-18 (×4): 1 mg via INTRAVENOUS
  Filled 2018-12-17 (×5): qty 1

## 2018-12-17 MED ORDER — MORPHINE SULFATE (PF) 4 MG/ML IV SOLN
4.0000 mg | Freq: Once | INTRAVENOUS | Status: AC
  Administered 2018-12-17: 16:00:00 4 mg via INTRAVENOUS
  Filled 2018-12-17: qty 1

## 2018-12-17 MED ORDER — METOCLOPRAMIDE HCL 5 MG/ML IJ SOLN
10.0000 mg | Freq: Four times a day (QID) | INTRAMUSCULAR | Status: DC
  Administered 2018-12-17 – 2018-12-19 (×7): 10 mg via INTRAVENOUS
  Filled 2018-12-17 (×7): qty 2

## 2018-12-17 MED ORDER — SODIUM CHLORIDE 0.9 % IV SOLN
INTRAVENOUS | Status: AC
  Administered 2018-12-17: 23:00:00 via INTRAVENOUS

## 2018-12-17 MED ORDER — HYDRALAZINE HCL 20 MG/ML IJ SOLN
5.0000 mg | INTRAMUSCULAR | Status: DC | PRN
  Filled 2018-12-17: qty 1

## 2018-12-17 MED ORDER — HYDROMORPHONE HCL 1 MG/ML IJ SOLN
1.0000 mg | Freq: Once | INTRAMUSCULAR | Status: AC
  Administered 2018-12-17: 18:00:00 1 mg via INTRAVENOUS
  Filled 2018-12-17: qty 1

## 2018-12-17 MED ORDER — SODIUM CHLORIDE 0.9 % IV BOLUS
1000.0000 mL | Freq: Once | INTRAVENOUS | Status: AC
  Administered 2018-12-17: 14:00:00 1000 mL via INTRAVENOUS

## 2018-12-17 MED ORDER — INSULIN ASPART 100 UNIT/ML ~~LOC~~ SOLN
0.0000 [IU] | Freq: Every day | SUBCUTANEOUS | Status: DC
  Administered 2018-12-18: 04:00:00 2 [IU] via SUBCUTANEOUS
  Filled 2018-12-17: qty 0.05

## 2018-12-17 MED ORDER — METOCLOPRAMIDE HCL 5 MG/ML IJ SOLN
10.0000 mg | Freq: Once | INTRAMUSCULAR | Status: AC
  Administered 2018-12-17: 14:00:00 10 mg via INTRAVENOUS
  Filled 2018-12-17: qty 2

## 2018-12-17 MED ORDER — MORPHINE SULFATE (PF) 4 MG/ML IV SOLN
4.0000 mg | Freq: Once | INTRAVENOUS | Status: AC
  Administered 2018-12-17: 4 mg via INTRAVENOUS
  Filled 2018-12-17: qty 1

## 2018-12-17 MED ORDER — HALOPERIDOL LACTATE 5 MG/ML IJ SOLN
5.0000 mg | Freq: Once | INTRAMUSCULAR | Status: AC
  Administered 2018-12-17: 16:00:00 5 mg via INTRAVENOUS
  Filled 2018-12-17: qty 1

## 2018-12-17 MED ORDER — POTASSIUM CHLORIDE 10 MEQ/100ML IV SOLN
10.0000 meq | INTRAVENOUS | Status: AC
  Administered 2018-12-17 – 2018-12-18 (×3): 10 meq via INTRAVENOUS
  Filled 2018-12-17 (×3): qty 100

## 2018-12-17 MED ORDER — INSULIN ASPART 100 UNIT/ML ~~LOC~~ SOLN
0.0000 [IU] | Freq: Three times a day (TID) | SUBCUTANEOUS | Status: DC
  Administered 2018-12-18: 18:00:00 5 [IU] via SUBCUTANEOUS
  Administered 2018-12-18: 12:00:00 7 [IU] via SUBCUTANEOUS
  Administered 2018-12-18: 09:00:00 5 [IU] via SUBCUTANEOUS
  Administered 2018-12-19 (×2): 3 [IU] via SUBCUTANEOUS
  Filled 2018-12-17: qty 0.09

## 2018-12-17 MED ORDER — ENOXAPARIN SODIUM 60 MG/0.6ML ~~LOC~~ SOLN
60.0000 mg | Freq: Every day | SUBCUTANEOUS | Status: DC
  Administered 2018-12-18 (×2): 60 mg via SUBCUTANEOUS
  Filled 2018-12-17 (×2): qty 0.6

## 2018-12-17 MED ORDER — PANTOPRAZOLE SODIUM 40 MG IV SOLR
40.0000 mg | Freq: Two times a day (BID) | INTRAVENOUS | Status: DC
  Administered 2018-12-17 – 2018-12-18 (×2): 40 mg via INTRAVENOUS
  Filled 2018-12-17 (×2): qty 40

## 2018-12-17 MED ORDER — ONDANSETRON HCL 4 MG/2ML IJ SOLN
4.0000 mg | Freq: Four times a day (QID) | INTRAMUSCULAR | Status: DC | PRN
  Administered 2018-12-17 – 2018-12-18 (×4): 4 mg via INTRAVENOUS
  Filled 2018-12-17 (×4): qty 2

## 2018-12-17 NOTE — ED Provider Notes (Signed)
Deborah [name] is a 53 y.o. female, presenting to the ED with abdominal pain accompanied by nausea and vomiting for the past several days.  She states her symptoms are consistent with previous instances of diabetic gastroparesis.  Though she complained of pain radiating into the chest with the previous provider, she had no such complaint during my time with her. Denies fever/chills, shortness of breath, cough, hematochezia/melena, diarrhea, syncope, or any other complaints.   HPI from [employer], PA-C: "Patient is a 53 year old female with a history of diabetes, fibromyalgia, gastroparesis, hyperlipidemia, hypertension, who presents the emergency department today for evaluation of nausea and vomiting.  States that last night she developed nausea, vomiting and lower abdominal pain.  Abdominal pain radiates to the upper abdomen and chest.  States abdominal pain is severe and consistent with her history of gastroparesis.  She denies any diarrhea, constipation or urinary symptoms.  She denies fevers.  States that morphine usually helps when she has these symptoms.  Of note, patient was recently admitted for intractable nausea/vomiting and DKA.  She was discharged 2 days ago."  Past Medical History:  Diagnosis Date  . Acute back pain with sciatica, left   . Acute back pain with sciatica, right   . Anemia, unspecified   . Chest pain 12/2015  . Chronic pain   . Diabetes mellitus   . Esophageal reflux   . Fibromyalgia   . Gastric ulcer   . Gastroparesis   . Gout   . Hyperlipidemia   . Hypertension   . Lumbosacral stenosis   . Obesity   . Pneumonia   . [name]   . Stroke ([place]) 02/2011  . Vitamin B12 deficiency anemia      Physical Exam  BP (!) 170/93 (BP Location: Right Arm)   Pulse (!) 108   Temp 98.2 F (36.8 C) (Oral)   Resp 20   LMP 10/10/2012   SpO2 99%   Physical Exam Vitals signs and nursing note reviewed.  Constitutional:      General: She is not in  acute distress.    Appearance: She is well-developed. She is obese. She is not diaphoretic.  HENT:     Head: Normocephalic and atraumatic.     Mouth/Throat:     Mouth: Mucous membranes are moist.     Pharynx: Oropharynx is clear.  Eyes:     Conjunctiva/sclera: Conjunctivae normal.  Neck:     Musculoskeletal: Neck supple.  Cardiovascular:     Rate and Rhythm: Normal rate and regular rhythm.     Pulses: Normal pulses.          Radial pulses are 2+ on the right side and 2+ on the left side.       Posterior tibial pulses are 2+ on the right side and 2+ on the left side.     Heart sounds: Normal heart sounds.     Comments: Tactile temperature in the extremities appropriate and equal bilaterally. Pulmonary:     Effort: Pulmonary effort is normal. No respiratory distress.     Breath sounds: Normal breath sounds.     Comments: No increased work of breathing noted.  Speaks in full sentences without noted difficulty.  No noted orthopnea. Abdominal:     Palpations: Abdomen is soft.     Tenderness: There is generalized abdominal tenderness. There is no guarding.  Musculoskeletal:     Right lower leg: No edema.     Left lower leg: No edema.  Lymphadenopathy:  Cervical: No cervical adenopathy.  Skin:    General: Skin is warm and dry.  Neurological:     Mental Status: She is alert.     Comments: Sensation grossly intact to light touch in the extremities.  Grip strengths equal bilaterally.  Strength 5/5 in all extremities. Coordination intact. Cranial nerves III-XII grossly intact. No facial droop.   Psychiatric:        Mood and Affect: Mood and affect normal.        Speech: Speech normal.        Behavior: Behavior normal.     ED Course/Procedures    Procedures   Abnormal Labs Reviewed  CBC WITH DIFFERENTIAL/PLATELET - Abnormal; Notable for the following components:      Result Value   WBC 11.4 (*)    Hemoglobin 10.9 (*)    MCHC 29.5 (*)    Platelets 425 (*)    Neutro Abs 8.4  (*)    All other components within normal limits  URINALYSIS, ROUTINE W REFLEX MICROSCOPIC - Abnormal; Notable for the following components:   APPearance HAZY (*)    Glucose, UA >=500 (*)    Hgb urine dipstick MODERATE (*)    Protein, ur >=300 (*)    Bacteria, UA RARE (*)    All other components within normal limits  CBG MONITORING, ED - Abnormal; Notable for the following components:   Glucose-Capillary 302 (*)    All other components within normal limits   BUN  Date Value Ref Range Status  12/17/2018 19 6 - 20 mg/dL Final  12/16/2018 18 6 - 20 mg/dL Final  12/16/2018 18 6 - 20 mg/dL Final  12/16/2018 18 6 - 20 mg/dL Final   Creatinine, Ser  Date Value Ref Range Status  12/17/2018 1.60 (H) 0.44 - 1.00 mg/dL Final  12/16/2018 1.60 (H) 0.44 - 1.00 mg/dL Final  12/16/2018 1.64 (H) 0.44 - 1.00 mg/dL Final  12/16/2018 1.71 (H) 0.44 - 1.00 mg/dL Final   Hemoglobin  Date Value Ref Range Status  12/17/2018 10.9 (L) 12.0 - 15.0 g/dL Final  12/15/2018 11.2 (L) 12.0 - 15.0 g/dL Final  12/15/2018 10.6 (L) 12.0 - 15.0 g/dL Final  12/13/2018 10.0 (L) 11.1 - 15.9 g/dL Final  07/20/2018 8.6 (L) 12.0 - 15.0 g/dL Final     Dg Chest Portable 1 View  Result Date: 12/15/2018 CLINICAL DATA:  Chest pain radiating to the right side today. [name]. History of hypertension and diabetes. EXAM: PORTABLE CHEST 1 VIEW COMPARISON:  Radiographs 07/19/2018 07/17/2018. FINDINGS: 0949 hours. The heart size is stable at the upper limits of normal. The lungs are clear. There is no pleural effusion or pneumothorax. Degenerative changes are present throughout the spine. There is evidence of chronic rotator cuff tears bilaterally with asymmetric glenohumeral arthropathy on the right. Telemetry leads overlie the chest. IMPRESSION: Stable borderline heart size.  No active cardiopulmonary process. Electronically Signed   By: Richardean Sale M.D.   On: 12/15/2018 10:16    EKG  Interpretation  Date/Time:  Sunday December 17 2018 14:10:25 EDT Ventricular Rate:  108 PR Interval:    QRS Duration: 87 QT Interval:  338 QTC Calculation: 453 R Axis:   -34 Text Interpretation:  Sinus tachycardia Ventricular premature complex Left axis deviation Abnormal R-wave progression, late transition Confirmed by Virgel Manifold [phone]) on 12/17/2018 2:13:34 PM         MDM   Clinical Course as of Dec 17 2154  Sun Dec 17, 2018  [place] patient.  States, "I feel much better than I did."  Pain is only in the abdomen currently, rated 7/10, nonradiating.  She has not vomited since arrival. We discussed goals of care, to include transitioning to oral medications for symptom management.  Patient states, "I know my body.  I cannot and will not go home like this.  I am having a flare of my gastroparesis and for that I need the IV medications." I asked the patient about her leaving AMA from the hospital yesterday. She states, "They wanted to give me oral medications. The IV ones work faster. When I am in one of these flares, I need the IV medications. They didn't want to listen to me."   [SJ]  4270 Patient noted to have these dips in her SPO2. Sometimes they are associated with poor waveform. I also noted they are sometimes associated with her moments of rest and sleep. It has been mentioned in previous notes patient could benefit from CPAP while sleeping. I suspect her obesity certainly plays a role in this. She shows no increased work of breathing, no distress, and denies any shortness of breath.  SpO2(!): 88 % [SJ]  1920 Patient states her pain is still 7/10.  I watched her SPO2 while I was in the room with her.  When she relaxes and closes her eyes, her SPO2 was noted to drop to around 90%, however, when I engage her in conversation, she opens her eyes, and her SPO2 rises to 96%. She continues to deny any chest pain or shortness of breath.   [SJ]  2118 Spoke with Dr. Marlowe Sax,  hospitalist. Agrees to admit the patient for observation.  They will obtain psych consult in the morning.   [SJ]    Clinical Course User Index [SJ] Lorayne Bender, PA-C   Patient care handoff report received from [employer], [place]. Plan: CT abdomen/pelvis, chest x-ray, CMP, lipase, and troponin are all pending.   Patient returns with abdominal pain, nausea, and vomiting that she states is consistent with her diabetic gastroparesis. She states she has followed with a GI out of [facility] and had a EGD last year. CT without acute abnormality.    Though patient has a mildly elevated troponin, my suspicion for current ACS is low based on the patient's pattern and description of symptoms, their duration, and her normal cardiac cath performed at [facility] July 2019.  She does have hyperglycemia, but no elevated anion gap.  Suspicion for DKA is low.  Patient's creatinine consistent with previous values.  Patient would accept IV medications only and refused to entertain oral medications.  Furthermore, during my time with her, she would not drink fluids so I was unable to verify her ability to keep fluids down.  This presented a much more difficult situation in the treatment of this patient. She was admitted for further management.  Findings and plan of care discussed with Gara Kroner, MD. Dr. Laverta Baltimore personally evaluated and examined this patient.  Vitals:   12/17/18 1315 12/17/18 1331  BP:  (!) 170/93  Pulse:  (!) 108  Resp:  20  Temp:  98.2 F (36.8 C)  TempSrc:  Oral  SpO2: 97% 99%   Vitals:   12/17/18 1830 12/17/18 1845 12/17/18 1900 12/17/18 1925  BP: (!) 149/88  132/82   Pulse: (!) 105 (!) 105 (!) 101 (!) 101  Resp: 20 (!) 24 (!) 23 (!) 30  Temp:      TempSrc:  SpO2: (!) 82% (!) 86% (!) 85% (!) 89%      Lorayne Bender, PA-C 12/18/18 0010    Margette Fast, MD 12/18/18 [phone]

## 2018-12-17 NOTE — ED Triage Notes (Signed)
Patient presented to the ed via EMS with c/o nausea and vomiting since last night. Patient state she vomited 5 times since last night.

## 2018-12-17 NOTE — ED Notes (Signed)
Gave pt 8oz water

## 2018-12-17 NOTE — ED Notes (Signed)
Pt drank 10 oz and said it made her feel nauseous.

## 2018-12-17 NOTE — H&P (Signed)
History and Physical    Deborah [name] WUJ:[id number] DOB: 04-01-1965 DOA: 12/17/2018  PCP: [name], DO Patient coming from: Home  Chief Complaint: Nausea and vomiting  HPI: Deborah [name] is a 53 y.o. female with medical history significant of chronic pain, type 2 diabetes, GERD, gastric ulcer, gastroparesis, history of cholecystectomy, gout, hypertension, hyperlipidemia, obesity, CVA presenting to the ED for evaluation of nausea and vomiting.  Patient was recently admitted by family medicine teaching service on 10/9 for intractable nausea and vomiting thought to be secondary to longstanding gastroparesis from poorly controlled diabetes.  She was hyperglycemic but not in DKA or HHS and was treated with insulin drip.  She also had a mild AKI and creatinine improved with IV fluid hydration.  Patient refused all p.o. medications during this hospitalization and requested IV pain medications.  She left AMA on 10/10, refusing p.o. medications.  Patient states since last night she is having vomiting and severe generalized abdominal pain.  She has not been able to tolerate any p.o. intake.  States yesterday she also had a brief episode of substernal burning chest pain at rest lasted a few seconds.  No chest pain at present.  Denies fevers, chills, shortness of breath, or cough.  Denies dysuria, urinary frequency, or urgency.   ED Course: Tachycardic and tachypneic initially, now resolved.  Oxygen saturation in the upper 80s to low 90s on room only when sleeping and placed on 2 L supplemental oxygen via nasal cannula.  White blood cell count 11.4.  Hemoglobin 10.9, no significant change since recent labs.  Potassium 3.3.  Blood glucose 319.  Bicarb 23, anion gap 12.  BUN 16, creatinine 1.6 (stable since recent hospitalization).  Lipase and LFTs normal.  UA (not clean catch).  With evidence of glucosuria and proteinuria. Also showing negative nitrite, negative leukocytes, 11-20 RBCs, 6-10  WBCs, and rare bacteria on microscopic examination.  Urine culture pending.  High-sensitivity troponin 22 >25, previously mildly elevated as well.  EKG without acute ischemic changes.  Chest x-ray showing no active disease.  CT abdomen pelvis showing hepatic steatosis.  No evidence of acute abnormalities within the solid abdominal organs.  5.1 x 4.5 cm left pelvic mass corresponding to an exophytic fibroid, in correlation to MRI dated November 29, 2017. Patient received Haldol, Dilaudid, Reglan, morphine, and 1 L fluid bolus in the ED.  Review of Systems:  All systems reviewed and apart from history of presenting illness, are negative.  Past Medical History:  Diagnosis Date  . Acute back pain with sciatica, left   . Acute back pain with sciatica, right   . Anemia, unspecified   . Chest pain 12/2015  . Chronic pain   . Diabetes mellitus   . Esophageal reflux   . Fibromyalgia   . Gastric ulcer   . Gastroparesis   . Gout   . Hyperlipidemia   . Hypertension   . Lumbosacral stenosis   . Obesity   . Pneumonia   . [name]   . Stroke ([place]) 02/2011  . Vitamin B12 deficiency anemia     Past Surgical History:  Procedure Laterality Date  . CATARACT EXTRACTION  01/2014  . CHOLECYSTECTOMY       reports that she has never smoked. She has never used smokeless tobacco. She reports that she does not drink alcohol or use drugs.  Allergies  Allergen Reactions  . Contrast Media [Iodinated Diagnostic Agents] Anaphylaxis    Cardiac arrest  . Diazepam Shortness Of  Breath  . Isovue [Iopamidol] Anaphylaxis    Patient had seizure like activity and then code post 100 cc of isovue 300  . Lisinopril Anaphylaxis    Tongue and mouth swelling  . Penicillins Palpitations    Has patient had a PCN reaction causing immediate rash, facial/tongue/throat swelling, SOB or lightheadedness with hypotension: Yes, heart races Has patient had a PCN reaction causing severe rash involving mucus membranes  or skin necrosis: No Has patient had a PCN reaction that required hospitalization: Yes  Has patient had a PCN reaction occurring within the last 10 years: No   . Acetaminophen Nausea Only and Other (See Comments)    Irritates stomach ulcer  Abdominal pain  . Tolmetin Nausea Only    Other reaction(s): Other (See Comments) ULCER  . Aspirin Other (See Comments)    Irritates stomach ulcer   . Food Hives and Swelling    Carrots, ketchup   . Nsaids Other (See Comments)    ULCER  . Tramadol Nausea And Vomiting    Family History  Problem Relation Age of Onset  . Diabetes Mother   . Diabetes Father   . Heart disease Father   . Diabetes Sister   . Congestive Heart Failure Sister 64  . Diabetes Brother     Prior to Admission medications   Medication Sig Start Date End Date Taking? Authorizing Provider  allopurinol (ZYLOPRIM) 100 MG tablet Take 1 tablet (100 mg total) by mouth daily. 09/23/15  Yes Debbe Odea, MD  amLODipine (NORVASC) 10 MG tablet Take 1 tablet (10 mg total) by mouth daily. 12/13/18  Yes Lockamy, Timothy, DO  atorvastatin (LIPITOR) 10 MG tablet Take 1 tablet (10 mg total) by mouth daily. 12/13/18  Yes Lockamy, Christia [place], DO  cetirizine (ZYRTEC) 10 MG tablet Take 1 tablet (10 mg total) by mouth daily. 12/13/18  Yes Lockamy, Timothy, DO  DULoxetine (CYMBALTA) 30 MG capsule Take 1 capsule (30 mg total) by mouth 2 (two) times daily. 09/23/15  Yes Debbe Odea, MD  furosemide (LASIX) 20 MG tablet Take 1 tablet (20 mg total) by mouth daily. 12/13/18  Yes Lockamy, Timothy, DO  hydrOXYzine (ATARAX/VISTARIL) 10 MG tablet Take 1 tablet (10 mg total) by mouth 3 (three) times daily as needed. Patient taking differently: Take 10 mg by mouth 3 (three) times daily as needed for itching.  12/13/18  Yes Lockamy, Timothy, DO  insulin glargine (LANTUS) 100 UNIT/ML injection Inject 0.45 mLs (45 Units total) into the skin at bedtime. 12/13/18  Yes Lockamy, Timothy, DO  insulin lispro (HUMALOG) 100  UNIT/ML injection Inject 0.1 mLs (10 Units total) into the skin 3 (three) times daily. Patient taking differently: Inject 10 Units into the skin See admin instructions. 10 units + Sliding scale three times daily with meals 12/13/18  Yes Lockamy, Timothy, DO  Melatonin 3 MG TABS Take 2 tablets (6 mg total) by mouth at bedtime. 12/13/18  Yes Lockamy, Timothy, DO  methocarbamol (ROBAXIN) 500 MG tablet Take 500 mg by mouth 2 (two) times a day.   Yes [provider]  metoCLOPramide (REGLAN) 10 MG tablet Take 1 tablet (10 mg total) by mouth 3 (three) times daily before meals. 12/13/18  Yes Lockamy, Timothy, DO  metoprolol succinate (TOPROL-XL) 25 MG 24 hr tablet Take 1 tablet (25 mg total) by mouth daily. 12/13/18  Yes [name], DO  Multiple Vitamin (MULTIVITAMIN WITH MINERALS) TABS tablet Take 1 tablet by mouth daily.   Yes [provider]  pantoprazole (PROTONIX) 40 MG  tablet Take 1 tablet (40 mg total) by mouth 2 (two) times daily. 12/13/18  Yes [name], DO  aspirin (ECOTRIN LOW STRENGTH) 81 MG EC tablet Take 1 tablet (81 mg total) by mouth daily. Swallow whole. 12/13/18   [name], DO  blood glucose meter kit and supplies KIT Dispense based on patient and insurance preference. Use up to four times daily as directed. (FOR ICD-9 250.00, 250.01). 12/13/18   [name], DO  nitroGLYCERIN (NITROSTAT) 0.4 MG SL tablet Place 1 tablet (0.4 mg total) under the tongue every 5 (five) minutes as needed for chest pain. 12/13/18   [name], DO  potassium chloride (K-DUR) 10 MEQ tablet Take 2 tablets (20 mEq total) by mouth daily for 7 days. Patient not taking: Reported on 11/28/2017 08/02/17 08/09/17  [name], MD    Physical Exam: Vitals:   12/17/18 2030 12/17/18 2100 12/17/18 2135 12/17/18 2139  BP: 133/90 (!) 145/99 (!) 145/81   Pulse: 94 95 93 93  Resp: _0 Temp:      TempSrc:      SpO2: 97% 96% 95% 96%    Physical Exam  Constitutional: She  is oriented to person, place, and time. She appears well-developed and well-nourished. No distress.  Resting comfortably watching television and drinking water  HENT:  Head: Normocephalic.  Eyes: Right eye exhibits no discharge. Left eye exhibits no discharge.  Neck: Neck supple.  Cardiovascular: Normal rate, regular rhythm and intact distal pulses.  Pulmonary/Chest: Effort normal and breath sounds normal. No respiratory distress. She has no wheezes. She has no rales.  Abdominal: Soft. Bowel sounds are normal. She exhibits no distension. There is abdominal tenderness. There is no rebound and no guarding.  Mild generalized tenderness to palpation  Musculoskeletal:        General: No edema.  Neurological: She is alert and oriented to person, place, and time.  Skin: Skin is warm and dry. She is not diaphoretic.     Labs on Admission: I have personally reviewed following labs and imaging studies  CBC: Recent Labs  Lab 12/13/18 0000 12/15/18 0945 12/15/18 1546 12/17/18 1412  WBC 8.3 12.2*  --  11.4*  NEUTROABS  --   --   --  8.4*  HGB 10.0* 10.6* 11.2* 10.9*  HCT 31.9* 34.6* 33.0* 37.0  MCV 87 90.3  --  91.8  PLT 372 376  --  327*   Basic Metabolic Panel: Recent Labs  Lab 12/13/18 0000  12/15/18 2159 12/16/18 0014 12/16/18 0410 12/16/18 0702 12/16/18 1150 12/17/18 1412  NA  --    < > 143 143 142 142 141 138  K  --    < > 3.3* 3.4* 3.2* 3.5 3.6 3.3*  CL  --    < > 107 109 106 107 106 103  CO2  --    < > _1 GLUCOSE  --    < > 196* 137* 197* 239* 319* 319*  BUN  --    < > _2 CREATININE  --    < > 1.62* 1.81* 1.71* 1.64* 1.60* 1.60*  CALCIUM  --    < > 8.8* 8.7* 8.8* 8.6* 8.9 9.1  MG 1.3*  --  1.6*  --   --   --   --   --    < > = values in this interval not displayed.   GFR: Estimated Creatinine Clearance:  60.7 mL/min (A) (by C-G formula based on SCr of 1.6 mg/dL (H)). Liver Function Tests: Recent Labs  Lab 12/15/18 0945 12/17/18  1412  AST 23 26  ALT 24 24  ALKPHOS 102 103  BILITOT 0.8 0.5  PROT 7.4 8.0  ALBUMIN 3.3* 3.6   Recent Labs  Lab 12/15/18 0945 12/17/18 1412  LIPASE 28 23   No results for input(s): AMMONIA in the last 168 hours. Coagulation Profile: Recent Labs  Lab 12/15/18 0945  INR 1.2   Cardiac Enzymes: No results for input(s): CKTOTAL, CKMB, CKMBINDEX, TROPONINI in the last 168 hours. BNP (last 3 results) No results for input(s): PROBNP in the last 8760 hours. HbA1C: No results for input(s): HGBA1C in the last 72 hours. CBG: Recent Labs  Lab 12/16/18 0827 12/16/18 1210 12/16/18 1702 12/17/18 1339 12/17/18 2210  GLUCAP 278* 270* 260* 302* 282*   Lipid Profile: No results for input(s): CHOL, HDL, LDLCALC, TRIG, CHOLHDL, LDLDIRECT in the last 72 hours. Thyroid Function Tests: No results for input(s): TSH, T4TOTAL, FREET4, T3FREE, THYROIDAB in the last 72 hours. Anemia Panel: No results for input(s): VITAMINB12, FOLATE, FERRITIN, TIBC, IRON, RETICCTPCT in the last 72 hours. Urine analysis:    Component Value Date/Time   COLORURINE YELLOW 12/17/2018 1326   APPEARANCEUR HAZY (A) 12/17/2018 1326   LABSPEC 1.022 12/17/2018 1326   PHURINE 5.0 12/17/2018 1326   GLUCOSEU >=500 (A) 12/17/2018 1326   HGBUR MODERATE (A) 12/17/2018 1326   BILIRUBINUR NEGATIVE 12/17/2018 [place] 12/17/2018 1326   PROTEINUR >=300 (A) 12/17/2018 1326   UROBILINOGEN 0.2 10/02/2013 2108   NITRITE NEGATIVE 12/17/2018 1326   LEUKOCYTESUR NEGATIVE 12/17/2018 1326    Radiological Exams on Admission: Ct Abdomen Pelvis Wo Contrast  Result Date: 12/17/2018 CLINICAL DATA:  Diffuse abdominal pain. Nausea vomiting. EXAM: CT ABDOMEN AND PELVIS WITHOUT CONTRAST TECHNIQUE: Multidetector CT imaging of the abdomen and pelvis was performed following the standard protocol without IV contrast. COMPARISON:  Jul 17, 2018 FINDINGS: Lower chest: No acute abnormality. Hepatobiliary: Hepatic steatosis.  Post cholecystectomy. Pancreas: Unremarkable. No pancreatic ductal dilatation or surrounding inflammatory changes. Spleen: Normal in size without focal abnormality. Adrenals/Urinary Tract: Adrenal glands are unremarkable. Kidneys are normal, without renal calculi, focal lesion, or hydronephrosis. Bladder is unremarkable. Stomach/Bowel: Stomach is within normal limits. No evidence of appendicitis. No evidence of bowel wall thickening, distention, or inflammatory changes. Vascular/Lymphatic: No significant vascular findings are present. No enlarged abdominal or pelvic lymph nodes. Reproductive: 5.1 x 4.5 cm left adnexal mass. Other: No abdominal wall hernia or abnormality. No abdominopelvic ascites. Musculoskeletal: L5-S1 spondylosis. IMPRESSION: 1. Hepatic steatosis. 2. No evidence of acute abnormalities within the solid abdominal organs. 3. 5.1 x 4.5 cm left pelvic mass corresponds to a exophytic fibroid, in correlation to MRI dated November 29, 2017. Electronically Signed   By: Fidela Salisbury M.D.   On: 12/17/2018 15:23   Dg Chest Portable 1 View  Result Date: 12/17/2018 CLINICAL DATA:  Nausea and vomiting. EXAM: PORTABLE CHEST 1 VIEW COMPARISON:  Chest x-ray dated December 15, 2018. FINDINGS: The heart remains at the upper limits of normal in size. Normal pulmonary vascularity. No focal consolidation, pleural effusion, or pneumothorax. No acute osseous abnormality. IMPRESSION: No active disease. Electronically Signed   By: Titus Dubin M.D.   On: 12/17/2018 15:21    EKG: Independently reviewed.  Sinus tachycardia, heart rate 108.  PVCs.  No acute ischemic changes.  Assessment/Plan Principal Problem:   Intractable nausea and vomiting Active Problems:  Hypokalemia   Gastroparesis   Hypoxia   Hyperglycemia   Intractable nausea and vomiting, suspect secondary to diabetic gastroparesis and GERD/ dyspepsia Afebrile and no significant leukocytosis.  Lipase and LFTs normal.  CT abdomen pelvis  showing hepatic steatosis and uterine fibroid.  CT without acute abdominal pathology to explain the patient's symptoms.  Has a prior history of cholecystectomy.  Patient is refusing p.o. medications and repeatedly requesting IV medications only.  Complaining of abdominal pain and requesting IV Dilaudid.  Appears comfortable on exam and no signs of distress.  No active emesis. -IV fluid hydration -IV Reglan -IV morphine PRN pain -IV Zofran PRN -IV PPI -Consider ordering gastric emptying study in a.m.  Abnormal urinalysis UA (not clean catch) showing negative nitrite, negative leukocytes, 6-10 WBCs, and rare bacteria.  Afebrile and no significant leukocytosis.  Patient is not endorsing UTI symptoms. -Urine culture pending  Hypoxia Oxygen saturation in the upper 80s to low 90s on room only when sleeping and placed on 2 L supplemental oxygen via nasal cannula.  Not hypoxic when awake.  No complaints of shortness of breath and no increased work of breathing.  Chest x-ray showing no active disease.  Patient is morbidly obese.  Suspect this could be related to OSA or OHS. -Continuous pulse ox, supplemental oxygen as needed to keep oxygen saturation above 94% -We will need outpatient sleep study.  Mild hypokalemia Likely related to GI loss from vomiting. Potassium 3.3.   -Replete potassium.  Check magnesium level and replete if low.  Continue to monitor electrolytes.  Hyperglycemia, uncontrolled insulin-dependent diabetes mellitus Blood glucose 319.  No signs of DKA.  Last A1c 9.5 on 10/7. -Sliding scale insulin and CBG checks. -Lantus 30 units at bedtime  Mildly elevated troponin High-sensitivity troponin 22 >25, previously mildly elevated as well.  EKG without acute ischemic changes. She had a brief episode of burning substernal chest pain yesterday at rest which lasted only a few seconds, suspect related to GERD.  No chest pain at present and appears comfortable on exam. -Cardiac monitoring  -Continue to monitor -IV PPI for uncontrolled GERD  Hypertension -Systolic currently in 572I to 140s.  Hydralazine PRN.  DVT prophylaxis: Lovenox Code Status: Full code Family Communication: No family available. Disposition Plan: Anticipate discharge after clinical improvement. Consults called: None Admission status: It is my clinical opinion that referral for OBSERVATION is reasonable and necessary in this patient based on the above information provided. The aforementioned taken together are felt to place the patient at high risk for further clinical deterioration. However it is anticipated that the patient may be medically stable for discharge from the hospital within 24 to 48 hours.  The medical decision making on this patient was of high complexity and the patient is at high risk for clinical deterioration, therefore this is a level 3 visit.  Shela Leff MD [facility] Pager [phone]  If 7PM-7AM, please contact night-coverage [email or web address] Password [email or web address]  12/17/2018, 10:25 PM

## 2018-12-17 NOTE — ED Provider Notes (Signed)
[facility] DEPT Provider Note   [name]: [id number] Arrival date & time: 12/17/18  1306     History   Chief Complaint No chief complaint on file.   HPI Deborah [name] is a 53 y.o. female.     HPI   Patient is a 53 year old female with a history of diabetes, fibromyalgia, gastroparesis, hyperlipidemia, hypertension, who presents the emergency department today for evaluation of nausea and vomiting.  States that last night she developed nausea, vomiting and lower abdominal pain.  Abdominal pain radiates to the upper abdomen and chest.  States abdominal pain is severe and consistent with her history of gastroparesis.  She denies any diarrhea, constipation or urinary symptoms.  She denies fevers.  States that morphine usually helps when she has these symptoms.  Of note, patient was recently admitted for intractable nausea/vomiting and DKA.  She was discharged 2 days ago.  Past Medical History:  Diagnosis Date  . Acute back pain with sciatica, left   . Acute back pain with sciatica, right   . Anemia, unspecified   . Chest pain 12/2015  . Chronic pain   . Diabetes mellitus   . Esophageal reflux   . Fibromyalgia   . Gastric ulcer   . Gastroparesis   . Gout   . Hyperlipidemia   . Hypertension   . Lumbosacral stenosis   . Obesity   . Pneumonia   . [name]   . Stroke ([place]) 02/2011  . Vitamin B12 deficiency anemia     Patient Active Problem List   Diagnosis Date Noted  . Elevated troponin I level   . Vomiting 07/18/2018  . Abdominal pain 07/17/2018  . Hyperkalemia 07/17/2018  . Cardiac arrest ([place]) 11/29/2017  . Pelvic mass 11/29/2017  . Leukocytosis 11/29/2017  . Anxiety 11/29/2017  . Allergic reaction to contrast media, initial encounter 11/28/2017  . Palliative care encounter   . Back pain 03/19/2017  . Stroke (cerebrum) ([place]) 03/19/2017  . GERD (gastroesophageal reflux disease) 03/19/2017  . Depression 03/19/2017  .  Obesity   . Urinary tract infection 08/16/2016  . Normocytic normochromic anemia 08/16/2016  . Gastroparesis 08/16/2016  . Intractable vomiting with nausea 06/17/2016  . Diabetic gastroparesis ([place]) 06/05/2016  . Gout 06/05/2016  . AKI (acute kidney injury) ([place]) 12/06/2015  . Chest pain 09/26/2015  . Hypokalemia 09/26/2015  . Hypomagnesemia 09/26/2015  . Nausea and vomiting 08/20/2015  . Gout flare 05/27/2015  . Lower abdominal pain 05/26/2015  . DKA (diabetic ketoacidoses) ([place]) 05/25/2015  . Uncontrolled type 2 diabetes mellitus with diabetic neuropathy, with long-term current use of insulin ([place]) 05/25/2015  . Type 2 diabetes mellitus with hyperglycemia, with long-term current use of insulin ([place]) 05/25/2015  . CKD (chronic kidney disease), stage II 05/25/2015  . Essential hypertension, benign 09/28/2013    Past Surgical History:  Procedure Laterality Date  . CATARACT EXTRACTION  01/2014  . CHOLECYSTECTOMY       OB History   No obstetric history on file.      Home Medications    Prior to Admission medications   Medication Sig Start Date End Date Taking? Authorizing Provider  allopurinol (ZYLOPRIM) 100 MG tablet Take 1 tablet (100 mg total) by mouth daily. 09/23/15   Debbe Odea, MD  amLODipine (NORVASC) 10 MG tablet Take 1 tablet (10 mg total) by mouth daily. 12/13/18   [name], DO  aspirin (ECOTRIN LOW STRENGTH) 81 MG EC tablet Take 1 tablet (81 mg total) by mouth  daily. Swallow whole. 12/13/18   [name], DO  atorvastatin (LIPITOR) 10 MG tablet Take 1 tablet (10 mg total) by mouth daily. 12/13/18   [name], DO  blood glucose meter kit and supplies KIT Dispense based on patient and insurance preference. Use up to four times daily as directed. (FOR ICD-9 250.00, 250.01). 12/13/18   [name], DO  cetirizine (ZYRTEC) 10 MG tablet Take 1 tablet (10 mg total) by mouth daily. 12/13/18   [name], DO  DULoxetine (CYMBALTA) 30 MG capsule  Take 1 capsule (30 mg total) by mouth 2 (two) times daily. 09/23/15   Debbe Odea, MD  furosemide (LASIX) 20 MG tablet Take 1 tablet (20 mg total) by mouth daily. 12/13/18   [name], DO  hydrOXYzine (ATARAX/VISTARIL) 10 MG tablet Take 1 tablet (10 mg total) by mouth 3 (three) times daily as needed. Patient taking differently: Take 10 mg by mouth 3 (three) times daily as needed for itching.  12/13/18   Lockamy, Christia Reading, DO  insulin glargine (LANTUS) 100 UNIT/ML injection Inject 0.45 mLs (45 Units total) into the skin at bedtime. 12/13/18   Lockamy, Christia Reading, DO  insulin lispro (HUMALOG) 100 UNIT/ML injection Inject 0.1 mLs (10 Units total) into the skin 3 (three) times daily. Patient taking differently: Inject 10 Units into the skin See admin instructions. 10 units + Sliding scale three times daily with meals 12/13/18   [name], DO  Melatonin 3 MG TABS Take 2 tablets (6 mg total) by mouth at bedtime. 12/13/18   [name], DO  methocarbamol (ROBAXIN) 500 MG tablet Take 500 mg by mouth 2 (two) times a day.    [provider]  metoCLOPramide (REGLAN) 10 MG tablet Take 1 tablet (10 mg total) by mouth 3 (three) times daily before meals. 12/13/18   [name], DO  metoprolol succinate (TOPROL-XL) 25 MG 24 hr tablet Take 1 tablet (25 mg total) by mouth daily. 12/13/18   [name], DO  Multiple Vitamin (MULTIVITAMIN WITH MINERALS) TABS tablet Take 1 tablet by mouth daily.    [provider]  nitroGLYCERIN (NITROSTAT) 0.4 MG SL tablet Place 1 tablet (0.4 mg total) under the tongue every 5 (five) minutes as needed for chest pain. 12/13/18   Lockamy, Christia Reading, DO  pantoprazole (PROTONIX) 40 MG tablet Take 1 tablet (40 mg total) by mouth 2 (two) times daily. 12/13/18   [name], DO  potassium chloride (K-DUR) 10 MEQ tablet Take 2 tablets (20 mEq total) by mouth daily for 7 days. Patient not taking: Reported on 11/28/2017 08/02/17 08/09/17  [name], MD     Family History Family History  Problem Relation Age of Onset  . Diabetes Mother   . Diabetes Father   . Heart disease Father   . Diabetes Sister   . Congestive Heart Failure Sister 77  . Diabetes Brother     Social History Social History   Tobacco Use  . Smoking status: Never Smoker  . Smokeless tobacco: Never Used  Substance Use Topics  . Alcohol use: No  . Drug use: No     Allergies   Contrast media [iodinated diagnostic agents], Diazepam, Isovue [iopamidol], Lisinopril, Penicillins, Acetaminophen, Tolmetin, Aspirin, Food, Nsaids, and Tramadol   Review of Systems Review of Systems  Constitutional: Negative for fever.  HENT: Negative for ear pain and sore throat.   Eyes: Negative for visual disturbance.  Respiratory: Negative for cough and shortness of breath.   Cardiovascular: Positive for chest pain (radiating from  abdomen).  Gastrointestinal: Positive for abdominal pain, nausea and vomiting. Negative for constipation and diarrhea.  Genitourinary: Negative for dysuria and hematuria.  Musculoskeletal: Negative for back pain.  Skin: Negative for rash.  Neurological: Negative for headaches.  All other systems reviewed and are negative.    Physical Exam Updated Vital Signs BP (!) 170/93 (BP Location: Right Arm)   Pulse (!) 108   Temp 98.2 F (36.8 C) (Oral)   Resp 20   LMP 10/10/2012   SpO2 99%   Physical Exam Vitals signs and nursing note reviewed.  Constitutional:      General: She is not in acute distress.    Appearance: She is well-developed.  HENT:     Head: Normocephalic and atraumatic.  Eyes:     Conjunctiva/sclera: Conjunctivae normal.  Neck:     Musculoskeletal: Neck supple.  Cardiovascular:     Rate and Rhythm: Regular rhythm. Tachycardia present.     Pulses: Normal pulses.     Heart sounds: Normal heart sounds. No murmur.  Pulmonary:     Effort: Pulmonary effort is normal. No respiratory distress.     Breath sounds: Normal breath  sounds. No wheezing, rhonchi or rales.  Abdominal:     General: Bowel sounds are normal.     Palpations: Abdomen is soft.     Comments: Diffuse abd ttp, no focal ttp.   Skin:    General: Skin is warm and dry.  Neurological:     Mental Status: She is alert.      ED Treatments / Results  Labs (all labs ordered are listed, but only abnormal results are displayed) Labs Reviewed  CBC WITH DIFFERENTIAL/PLATELET - Abnormal; Notable for the following components:      Result Value   WBC 11.4 (*)    Hemoglobin 10.9 (*)    MCHC 29.5 (*)    Platelets 425 (*)    Neutro Abs 8.4 (*)    All other components within normal limits  URINALYSIS, ROUTINE W REFLEX MICROSCOPIC - Abnormal; Notable for the following components:   APPearance HAZY (*)    Glucose, UA >=500 (*)    Hgb urine dipstick MODERATE (*)    Protein, ur >=300 (*)    Bacteria, UA RARE (*)    All other components within normal limits  CBG MONITORING, ED - Abnormal; Notable for the following components:   Glucose-Capillary 302 (*)    All other components within normal limits  URINE CULTURE  COMPREHENSIVE METABOLIC PANEL  LIPASE, BLOOD  TROPONIN I (HIGH SENSITIVITY)  TROPONIN I (HIGH SENSITIVITY)    EKG EKG Interpretation  Date/Time:  Sunday December 17 2018 14:10:25 EDT Ventricular Rate:  108 PR Interval:    QRS Duration: 87 QT Interval:  338 QTC Calculation: 453 R Axis:   -34 Text Interpretation:  Sinus tachycardia Ventricular premature complex Left axis deviation Abnormal R-wave progression, late transition Confirmed by Virgel Manifold [phone]) on 12/17/2018 2:13:34 PM   Radiology Ct Abdomen Pelvis Wo Contrast  Result Date: 12/17/2018 CLINICAL DATA:  Diffuse abdominal pain. Nausea vomiting. EXAM: CT ABDOMEN AND PELVIS WITHOUT CONTRAST TECHNIQUE: Multidetector CT imaging of the abdomen and pelvis was performed following the standard protocol without IV contrast. COMPARISON:  Jul 17, 2018 FINDINGS: Lower chest: No acute  abnormality. Hepatobiliary: Hepatic steatosis. Post cholecystectomy. Pancreas: Unremarkable. No pancreatic ductal dilatation or surrounding inflammatory changes. Spleen: Normal in size without focal abnormality. Adrenals/Urinary Tract: Adrenal glands are unremarkable. Kidneys are normal, without renal calculi, focal lesion, or hydronephrosis. Bladder  is unremarkable. Stomach/Bowel: Stomach is within normal limits. No evidence of appendicitis. No evidence of bowel wall thickening, distention, or inflammatory changes. Vascular/Lymphatic: No significant vascular findings are present. No enlarged abdominal or pelvic lymph nodes. Reproductive: 5.1 x 4.5 cm left adnexal mass. Other: No abdominal wall hernia or abnormality. No abdominopelvic ascites. Musculoskeletal: L5-S1 spondylosis. IMPRESSION: 1. Hepatic steatosis. 2. No evidence of acute abnormalities within the solid abdominal organs. 3. 5.1 x 4.5 cm left pelvic mass corresponds to a exophytic fibroid, in correlation to MRI dated November 29, 2017. Electronically Signed   By: Fidela Salisbury M.D.   On: 12/17/2018 15:23   Dg Chest Portable 1 View  Result Date: 12/17/2018 CLINICAL DATA:  Nausea and vomiting. EXAM: PORTABLE CHEST 1 VIEW COMPARISON:  Chest x-ray dated December 15, 2018. FINDINGS: The heart remains at the upper limits of normal in size. Normal pulmonary vascularity. No focal consolidation, pleural effusion, or pneumothorax. No acute osseous abnormality. IMPRESSION: No active disease. Electronically Signed   By: Titus Dubin M.D.   On: 12/17/2018 15:21    Procedures Procedures (including critical care time)  Medications Ordered in ED Medications  morphine 4 MG/ML injection 4 mg (has no administration in time range)  haloperidol lactate (HALDOL) injection 5 mg (has no administration in time range)  sodium chloride 0.9 % bolus 1,000 mL (1,000 mLs Intravenous New Bag/Given 12/17/18 1425)  metoCLOPramide (REGLAN) injection 10 mg (10 mg  Intravenous Given 12/17/18 1425)  morphine 4 MG/ML injection 4 mg (4 mg Intravenous Given 12/17/18 1425)     Initial Impression / Assessment and Plan / ED Course  I have reviewed the triage vital signs and the nursing notes.  Pertinent labs & imaging results that were available during my care of the patient were reviewed by me and considered in my medical decision making (see chart for details).   Final Clinical Impressions(s) / ED Diagnoses   Final diagnoses:  Gastroparesis   Patient is a 53 year old female who presents for eval of abd pain, NV that is consistent with past h/o gastroparesis. Recent admission for DKA and intractable NV.   Initially marginally tachycardic with hypertension as well. Afebrile with otherwise normal VS.   Initial CBG elevated UA w/o ketones. Equivocal for UTI. Culture sent.  EKG with sinus tachycardia, PVC, Left axis deviation, Abnormal R-wave progression, late transition   At shift change, pt pending labs, cxr, and CT abd. Care transitioned to [facility], [place]. If imaging and labs are reassuring and patient is able to tolerate PO. She is likely safe for d/c home with symptomatic tx. If pt has intractable vomiting, then would recommend admission.   ED Discharge Orders    None       Bishop Dublin 12/17/18 1530    Lacretia Leigh, MD 12/21/18 1125

## 2018-12-17 NOTE — Progress Notes (Signed)
Lovenox per Pharmacy for DVT Prophylaxis    Pharmacy has been consulted from dosing enoxaparin (lovenox) in this patient for DVT prophylaxis.  The pharmacist has reviewed pertinent labs (Hgb __10.9_; PLT_425__), patient weight (_150__kg) and renal function (CrCl_60__mL/min) and decided that enoxaparin _60_mg SQ Q24Hrs is appropriate for this patient.  The pharmacy department will sign off at this time.  Please reconsult pharmacy if status changes or for further issues.  Thank you  Cyndia Diver PharmD, BCPS  12/17/2018, 11:44 PM

## 2018-12-18 ENCOUNTER — Other Ambulatory Visit: Payer: Self-pay

## 2018-12-18 DIAGNOSIS — E876 Hypokalemia: Secondary | ICD-10-CM | POA: Diagnosis present

## 2018-12-18 DIAGNOSIS — K760 Fatty (change of) liver, not elsewhere classified: Secondary | ICD-10-CM | POA: Diagnosis present

## 2018-12-18 DIAGNOSIS — N179 Acute kidney failure, unspecified: Secondary | ICD-10-CM | POA: Diagnosis present

## 2018-12-18 DIAGNOSIS — E1122 Type 2 diabetes mellitus with diabetic chronic kidney disease: Secondary | ICD-10-CM | POA: Diagnosis present

## 2018-12-18 DIAGNOSIS — N182 Chronic kidney disease, stage 2 (mild): Secondary | ICD-10-CM | POA: Diagnosis present

## 2018-12-18 DIAGNOSIS — K219 Gastro-esophageal reflux disease without esophagitis: Secondary | ICD-10-CM | POA: Diagnosis present

## 2018-12-18 DIAGNOSIS — Z8249 Family history of ischemic heart disease and other diseases of the circulatory system: Secondary | ICD-10-CM | POA: Diagnosis not present

## 2018-12-18 DIAGNOSIS — I10 Essential (primary) hypertension: Secondary | ICD-10-CM

## 2018-12-18 DIAGNOSIS — Z8673 Personal history of transient ischemic attack (TIA), and cerebral infarction without residual deficits: Secondary | ICD-10-CM | POA: Diagnosis not present

## 2018-12-18 DIAGNOSIS — I493 Ventricular premature depolarization: Secondary | ICD-10-CM | POA: Diagnosis present

## 2018-12-18 DIAGNOSIS — Z794 Long term (current) use of insulin: Secondary | ICD-10-CM | POA: Diagnosis not present

## 2018-12-18 DIAGNOSIS — Z20828 Contact with and (suspected) exposure to other viral communicable diseases: Secondary | ICD-10-CM | POA: Diagnosis present

## 2018-12-18 DIAGNOSIS — Z6841 Body Mass Index (BMI) 40.0 and over, adult: Secondary | ICD-10-CM | POA: Diagnosis not present

## 2018-12-18 DIAGNOSIS — K3184 Gastroparesis: Secondary | ICD-10-CM

## 2018-12-18 DIAGNOSIS — E1143 Type 2 diabetes mellitus with diabetic autonomic (poly)neuropathy: Principal | ICD-10-CM

## 2018-12-18 DIAGNOSIS — R0902 Hypoxemia: Secondary | ICD-10-CM | POA: Diagnosis present

## 2018-12-18 DIAGNOSIS — Z9049 Acquired absence of other specified parts of digestive tract: Secondary | ICD-10-CM | POA: Diagnosis not present

## 2018-12-18 DIAGNOSIS — M797 Fibromyalgia: Secondary | ICD-10-CM | POA: Diagnosis present

## 2018-12-18 DIAGNOSIS — Z886 Allergy status to analgesic agent status: Secondary | ICD-10-CM | POA: Diagnosis not present

## 2018-12-18 DIAGNOSIS — E785 Hyperlipidemia, unspecified: Secondary | ICD-10-CM | POA: Diagnosis present

## 2018-12-18 DIAGNOSIS — I129 Hypertensive chronic kidney disease with stage 1 through stage 4 chronic kidney disease, or unspecified chronic kidney disease: Secondary | ICD-10-CM | POA: Diagnosis present

## 2018-12-18 DIAGNOSIS — R112 Nausea with vomiting, unspecified: Secondary | ICD-10-CM | POA: Diagnosis not present

## 2018-12-18 DIAGNOSIS — Z833 Family history of diabetes mellitus: Secondary | ICD-10-CM | POA: Diagnosis not present

## 2018-12-18 DIAGNOSIS — Z8711 Personal history of peptic ulcer disease: Secondary | ICD-10-CM | POA: Diagnosis not present

## 2018-12-18 DIAGNOSIS — E1165 Type 2 diabetes mellitus with hyperglycemia: Secondary | ICD-10-CM | POA: Diagnosis present

## 2018-12-18 LAB — BASIC METABOLIC PANEL
Anion gap: 7 (ref 5–15)
BUN: 18 mg/dL (ref 6–20)
CO2: 27 mmol/L (ref 22–32)
Calcium: 8.3 mg/dL — ABNORMAL LOW (ref 8.9–10.3)
Chloride: 107 mmol/L (ref 98–111)
Creatinine, Ser: 1.5 mg/dL — ABNORMAL HIGH (ref 0.44–1.00)
GFR calc Af Amer: 45 mL/min — ABNORMAL LOW (ref >60)
GFR calc non Af Amer: 39 mL/min — ABNORMAL LOW (ref >60)
Glucose, Bld: 253 mg/dL — ABNORMAL HIGH (ref 70–99)
Potassium: 3.6 mmol/L (ref 3.5–5.1)
Sodium: 141 mmol/L (ref 135–145)

## 2018-12-18 LAB — CBC
HCT: 29.9 % — ABNORMAL LOW (ref 36.0–46.0)
Hemoglobin: 8.9 g/dL — ABNORMAL LOW (ref 12.0–15.0)
MCH: 27.6 pg (ref 26.0–34.0)
MCHC: 29.8 g/dL — ABNORMAL LOW (ref 30.0–36.0)
MCV: 92.9 fL (ref 80.0–100.0)
Platelets: 320 10*3/uL (ref 150–400)
RBC: 3.22 MIL/uL — ABNORMAL LOW (ref 3.87–5.11)
RDW: 14.7 % (ref 11.5–15.5)
WBC: 10.1 10*3/uL (ref 4.0–10.5)
nRBC: 0 % (ref 0.0–0.2)

## 2018-12-18 LAB — URINE CULTURE

## 2018-12-18 LAB — MAGNESIUM: Magnesium: 1.7 mg/dL (ref 1.7–2.4)

## 2018-12-18 LAB — SARS CORONAVIRUS 2 (TAT 6-24 HRS): SARS Coronavirus 2: NEGATIVE

## 2018-12-18 LAB — GLUCOSE, CAPILLARY
Glucose-Capillary: 295 mg/dL — ABNORMAL HIGH (ref 70–99)
Glucose-Capillary: 312 mg/dL — ABNORMAL HIGH (ref 70–99)
Glucose-Capillary: 296 mg/dL — ABNORMAL HIGH (ref 70–99)
Glucose-Capillary: 200 mg/dL — ABNORMAL HIGH (ref 70–99)

## 2018-12-18 LAB — CBG MONITORING, ED: Glucose-Capillary: 240 mg/dL — ABNORMAL HIGH (ref 70–99)

## 2018-12-18 MED ORDER — METOPROLOL SUCCINATE ER 25 MG PO TB24
25.0000 mg | ORAL_TABLET | Freq: Every day | ORAL | Status: DC
  Administered 2018-12-18 – 2018-12-19 (×2): 25 mg via ORAL
  Filled 2018-12-18 (×2): qty 1

## 2018-12-18 MED ORDER — MELATONIN 3 MG PO TABS
6.0000 mg | ORAL_TABLET | Freq: Every day | ORAL | Status: DC
  Administered 2018-12-18: 21:00:00 6 mg via ORAL
  Filled 2018-12-18 (×2): qty 2

## 2018-12-18 MED ORDER — FLUTICASONE PROPIONATE 50 MCG/ACT NA SUSP
2.0000 | Freq: Every day | NASAL | Status: DC
  Administered 2018-12-18 – 2018-12-19 (×2): 2 via NASAL
  Filled 2018-12-18: qty 16

## 2018-12-18 MED ORDER — INSULIN ASPART 100 UNIT/ML ~~LOC~~ SOLN
5.0000 [IU] | Freq: Three times a day (TID) | SUBCUTANEOUS | Status: DC
  Administered 2018-12-18 (×2): 5 [IU] via SUBCUTANEOUS

## 2018-12-18 MED ORDER — AMLODIPINE BESYLATE 10 MG PO TABS: 10.0000 mg | ORAL_TABLET | Freq: Every day | ORAL | Status: DC

## 2018-12-18 MED ORDER — ATORVASTATIN CALCIUM 10 MG PO TABS
10.0000 mg | ORAL_TABLET | Freq: Every day | ORAL | Status: DC
  Administered 2018-12-18 – 2018-12-19 (×2): 10 mg via ORAL
  Filled 2018-12-18 (×2): qty 1

## 2018-12-18 MED ORDER — DULOXETINE HCL 30 MG PO CPEP
30.0000 mg | ORAL_CAPSULE | Freq: Two times a day (BID) | ORAL | Status: DC
  Administered 2018-12-18 – 2018-12-19 (×2): 30 mg via ORAL
  Filled 2018-12-18 (×2): qty 1

## 2018-12-18 MED ORDER — SODIUM CHLORIDE 0.9 % IV SOLN
INTRAVENOUS | Status: DC
  Administered 2018-12-18 – 2018-12-19 (×2): via INTRAVENOUS

## 2018-12-18 MED ORDER — PANTOPRAZOLE SODIUM 40 MG PO TBEC
40.0000 mg | DELAYED_RELEASE_TABLET | Freq: Two times a day (BID) | ORAL | Status: DC
  Administered 2018-12-18 – 2018-12-19 (×2): 40 mg via ORAL
  Filled 2018-12-18 (×2): qty 1

## 2018-12-18 NOTE — Progress Notes (Signed)
MD- Pt stated she neds Rxs for the following at time of d/c: Lantus Insulin pen- Order # [id number] CBG meter and supplies- Order # [id number]   Thanks!  Wyn Quaker RN, MSN, CDE Diabetes Coordinator Inpatient Glycemic Control Team Team Pager: [phone] (8a-5p)

## 2018-12-18 NOTE — ED Notes (Signed)
Moved pt to room 22

## 2018-12-18 NOTE — Progress Notes (Addendum)
Inpatient Diabetes Program Recommendations  AACE/ADA: New Consensus Statement on Inpatient Glycemic Control (2015)  Target Ranges:  Prepandial:   less than 140 mg/dL      Peak postprandial:   less than 180 mg/dL (1-2 hours)      Critically ill patients:  140 - 180 mg/dL   Results for KEILYN, HAGGARD (MRN [id number]) as of 12/18/2018 08:49  Ref. Range 12/15/2018 09:45  Glucose Latest Ref Range: 70 - 99 mg/dL [facility])   Results for AUBRIEGH, MINCH (MRN [id number]) as of 12/18/2018 08:49  Ref. Range 07/17/2018 03:20 12/13/2018 00:00  Hemoglobin A1C Latest Ref Range: 4.8 - 5.6 % 9.1 (H) 9.5 (H)  (226 mg/dl)   Results for DENNISSE, SWADER (MRN [id number]) as of 12/18/2018 08:49  Ref. Range 12/17/2018 13:39 12/17/2018 22:10 12/18/2018 03:09  Glucose-Capillary Latest Ref Range: 70 - 99 mg/dL 302 (H) 282 (H)    30 units LANTUS given at 11:30pm 240 (H)  2 units NOVOLOG     Admit with: N&V, Hyperglycemia  History: DM, Gastroparesis, CVA  Home DM Meds: Lantus 45 units QHS       Humalog 10 units TID       Humalog SSI  Current Orders: Lantus 30 units QHS      Novolog Sensitive Correction Scale/ SSI (0-9 units) TID AC + HS    Admit 10/09 with N&V, Gastroparesis issues, CP--Left AMA 10/10--Back to ED on 10/11 with the Same Issues.  PCP: Dr. [facility]      MD- Patient told the Diabetes Coordinator on 12/16/2018 that she needs Rxs for Lantus pens, Humalog pens, Insulin pen needles, and a new Glucometer.  May also consider increasing the Novolog SSi regimen to the Moderate scale (0-15 units)   MD- Pt stated she neds Rxs for the following at tim eof d/c: Lantus Insulin pen- Order # [id number] CBG meter and supplies- Order # [id number]     Addendum 11:50am- Met w/ pt today to discuss ED/Admission course, home DM care regimen, current A1c, gastroparesis issues, etc.  Pt clarified home Insulin regimen (see above).  Reviewed current A1c of 9.5% and  reviewed importance of CBG control at home.  Encouraged close follow up with PCP Dr. Garlan Fillers after d/c.  Pt stated she has Humalog and Insulin pen needles at home but needs Lantus insulin pen and CBG meter for home.  Will request MD provide these two Rxs at time of d/c.    --Will follow patient during hospitalization--  Wyn Quaker RN, MSN, CDE Diabetes Coordinator Inpatient Glycemic Control Team Team Pager: [phone] (8a-5p)

## 2018-12-18 NOTE — Progress Notes (Signed)
[facility]                                                                              Patient Demographics  Deborah [name], is a 53 y.o. female, DOB - 13-Feb-1966, NID:[id number]  Admit date - 12/17/2018   Admitting Physician Shela Leff, MD  Outpatient Primary MD for the patient is [name], DO  Outpatient specialists:   LOS - 0  days   Medical records reviewed and are as summarized below:    No chief complaint on file.      Brief summary   EVEA SHEEK is a 53 y.o. female with medical history significant of chronic pain, type 2 diabetes, GERD, gastric ulcer, gastroparesis, history of cholecystectomy, gout, hypertension, hyperlipidemia, obesity, CVA presented to ED with nausea and vomiting.  Patient was recently admitted by family medicine teaching service on 10/9 for intractable nausea and vomiting thought to be secondary to longstanding gastroparesis from poorly controlled diabetes.  Patient was hyperglycemic but not in DKA or HHS.  Patient reported severe generalized abdominal pain with vomiting and not able to tolerate anything p.o., brief episode of substernal chest pain lasting few seconds, symptoms started night before the admission.   CT abdomen pelvis showed hepatic steatosis, no acute abnormalities.  5.1x 4.5 cm left pelvic mass corresponding to an exophytic fibroid. Patient was admitted for further work-up.  COVID-19 test negative  Assessment & Plan    Principal Problem:   Intractable nausea and vomiting likely due to acute flare of diabetic gastroparesis, GERD/dyspepsia -Lipase, LFTs normal, CT abdomen pelvis with no acute intra-abdominal pathology -Continue IV fluid hydration, IV Reglan, Zofran as needed, PPI -Better glycemic control.  Clear liquid diet for now, advance as tolerated  Active Problems:   Essential hypertension, benign -BP currently stable, restart beta-blocker.  Continue to hold Norvasc for now.   Type 2 diabetes mellitus with hyperglycemia, with long-term current use of insulin (HCC) with CKD, hyperglycemia -Uncontrolled, CBGs in 300s -Hemoglobin A1c 9.5 on 12/13/2018 -Restarted Lantus 30 units at bedtime, and NovoLog meal coverage 5 units 3 times daily AC, sliding scale insulin     CKD (chronic kidney disease), stage II -Baseline creatinine 1.6-1.7 -Currently at baseline, continue gentle hydration -Hold Lasix  Mildly elevated high-sensitivity troponin -Currently no chest pain, EKG with no acute ST-T wave changes suggestive of ischemia -Prior echo 11/2017 had shown EF of 60 to 65%, mild pulmonary hypertension. -Second troponin trending down, follow closely if elevated, will obtain 2D echo for further work-up    Hypokalemia -Resolved   History of prior CVA -Continue statin, allergic to aspirin   Morbid obesity -BMI 53.4, counseled on diet and weight control  Hyperlipidemia -Continue statin   Code Status: Full CODE STATUS DVT Prophylaxis: Lovenox Family Communication: Discussed all imaging results, lab results, explained to the patient   Disposition Plan: Still nauseous, has not tolerated diet yet.  Remains inpatient today.  If nausea and vomiting improved, tolerating solid diet, possible DC home in next 24 to 48 hours  Time Spent in minutes   35 minutes  Procedures:  None  Consultants:   None  Antimicrobials:  Anti-infectives (From admission, onward)   None         Medications  Scheduled Meds: . atorvastatin  10 mg Oral Daily  . DULoxetine  30 mg Oral BID  . enoxaparin (LOVENOX) injection  60 mg Subcutaneous QHS  . insulin aspart  0-5 Units Subcutaneous QHS  . insulin aspart  0-9 Units Subcutaneous TID WC  . insulin aspart  5 Units Subcutaneous TID WC  . insulin glargine  30 Units Subcutaneous QHS  . Melatonin  6 mg Oral QHS  . metoCLOPramide (REGLAN) injection  10 mg Intravenous Q6H  . metoprolol succinate  25 mg Oral Daily  . pantoprazole   40 mg Oral BID   Continuous Infusions: PRN Meds:.hydrALAZINE, morphine injection, ondansetron (ZOFRAN) IV      Subjective:   Shacola Schussler was seen and examined today.  Still feeling nauseous, no active vomiting during my encounter.  States still has mild epigastric pain, improving.  No shortness of breath or chest pain currently.  Patient denies dizziness, new weakness, numbess, tingling. No acute events overnight.    Objective:   Vitals:   12/18/18 0630 12/18/18 0700 12/18/18 0852 12/18/18 0926  BP: (!) 158/93 130/78 (!) 132/92 (!) 132/92  Pulse: 99 92 95 95  Resp: (!) 26 16 18 18   Temp:   97.7 F (36.5 C) 97.7 F (36.5 C)  TempSrc:   Oral Oral  SpO2: 92% 96% 98%   Weight:    (!) 150 kg  Height:    5\' 6"  (1.676 m)    Intake/Output Summary (Last 24 hours) at 12/18/2018 1138 Last data filed at 12/18/2018 0357 Gross per 24 hour  Intake 287.63 ml  Output -  Net 287.63 ml     Wt Readings from Last 3 Encounters:  12/18/18 (!) 150 kg  12/15/18 (!) 150 kg  12/13/18 (!) 150.7 kg     Exam  General: Alert and oriented x 3, NAD  Eyes:   HEENT:  Atraumatic, normocephalic  Cardiovascular: S1 S2 auscultated, no murmurs, RRR  Respiratory: Clear to auscultation bilaterally, no wheezing, rales or rhonchi  Gastrointestinal: Soft, nontender, nondistended, + bowel sounds  Ext: no pedal edema bilaterally  Neuro: No new deficits  Musculoskeletal: No digital cyanosis, clubbing  Skin: No rashes  Psych: Normal affect and demeanor, alert and oriented x3    Data Reviewed:  I have personally reviewed following labs and imaging studies  Micro Results Recent Results (from the past 240 hour(s))  SARS CORONAVIRUS 2 (TAT 6-24 HRS) Nasopharyngeal Nasopharyngeal Swab     Status: None   Collection Time: 12/15/18 11:23 AM   Specimen: Nasopharyngeal Swab  Result Value Ref Range Status   SARS Coronavirus 2 NEGATIVE NEGATIVE Final    Comment: (NOTE) SARS-CoV-2 target  nucleic acids are NOT DETECTED. The SARS-CoV-2 RNA is generally detectable in upper and lower respiratory specimens during the acute phase of infection. Negative results do not preclude SARS-CoV-2 infection, do not rule out co-infections with other pathogens, and should not be used as the sole basis for treatment or other patient management decisions. Negative results must be combined with clinical observations, patient history, and epidemiological information. The expected result is Negative. Fact Sheet for Patients: SugarRoll.be Fact Sheet for Healthcare Providers: https://www.woods-mathews.com/ This test is not yet approved or cleared by the [place] FDA and  has been authorized for detection and/or diagnosis of SARS-CoV-2 by FDA under an Emergency Use Authorization (EUA). This EUA will remain  in effect (meaning this test can  be used) for the duration of the COVID-19 declaration under Section 56 4(b)(1) of the Act, 21 U.S.C. section 360bbb-3(b)(1), unless the authorization is terminated or revoked sooner. Performed at [facility], [place] [address] [place], [place] [address]   MRSA PCR Screening     Status: None   Collection Time: 12/16/18  1:39 AM   Specimen: Nasal Mucosa; Nasopharyngeal  Result Value Ref Range Status   MRSA by PCR NEGATIVE NEGATIVE Final    Comment:        The GeneXpert MRSA Assay (FDA approved for NASAL specimens only), is one component of a comprehensive MRSA colonization surveillance program. It is not intended to diagnose MRSA infection nor to guide or monitor treatment for MRSA infections. Performed at [facility], [place] [address] [place], [place] [address]     Radiology Reports Ct Abdomen Pelvis Wo Contrast  Result Date: 12/17/2018 CLINICAL DATA:  Diffuse abdominal pain. Nausea vomiting. EXAM: CT ABDOMEN AND PELVIS WITHOUT CONTRAST TECHNIQUE: Multidetector CT imaging of the abdomen and  pelvis was performed following the standard protocol without IV contrast. COMPARISON:  Jul 17, 2018 FINDINGS: Lower chest: No acute abnormality. Hepatobiliary: Hepatic steatosis. Post cholecystectomy. Pancreas: Unremarkable. No pancreatic ductal dilatation or surrounding inflammatory changes. Spleen: Normal in size without focal abnormality. Adrenals/Urinary Tract: Adrenal glands are unremarkable. Kidneys are normal, without renal calculi, focal lesion, or hydronephrosis. Bladder is unremarkable. Stomach/Bowel: Stomach is within normal limits. No evidence of appendicitis. No evidence of bowel wall thickening, distention, or inflammatory changes. Vascular/Lymphatic: No significant vascular findings are present. No enlarged abdominal or pelvic lymph nodes. Reproductive: 5.1 x 4.5 cm left adnexal mass. Other: No abdominal wall hernia or abnormality. No abdominopelvic ascites. Musculoskeletal: L5-S1 spondylosis. IMPRESSION: 1. Hepatic steatosis. 2. No evidence of acute abnormalities within the solid abdominal organs. 3. 5.1 x 4.5 cm left pelvic mass corresponds to a exophytic fibroid, in correlation to MRI dated November 29, 2017. Electronically Signed   By: Fidela Salisbury M.D.   On: 12/17/2018 15:23   Dg Chest Portable 1 View  Result Date: 12/17/2018 CLINICAL DATA:  Nausea and vomiting. EXAM: PORTABLE CHEST 1 VIEW COMPARISON:  Chest x-ray dated December 15, 2018. FINDINGS: The heart remains at the upper limits of normal in size. Normal pulmonary vascularity. No focal consolidation, pleural effusion, or pneumothorax. No acute osseous abnormality. IMPRESSION: No active disease. Electronically Signed   By: Titus Dubin M.D.   On: 12/17/2018 15:21   Dg Chest Portable 1 View  Result Date: 12/15/2018 CLINICAL DATA:  Chest pain radiating to the right side today. [name]. History of hypertension and diabetes. EXAM: PORTABLE CHEST 1 VIEW COMPARISON:  Radiographs 07/19/2018 07/17/2018. FINDINGS: 0949  hours. The heart size is stable at the upper limits of normal. The lungs are clear. There is no pleural effusion or pneumothorax. Degenerative changes are present throughout the spine. There is evidence of chronic rotator cuff tears bilaterally with asymmetric glenohumeral arthropathy on the right. Telemetry leads overlie the chest. IMPRESSION: Stable borderline heart size.  No active cardiopulmonary process. Electronically Signed   By: Richardean Sale M.D.   On: 12/15/2018 10:16    Lab Data:  CBC: Recent Labs  Lab 12/13/18 0000 12/15/18 0945 12/15/18 1546 12/17/18 1412 12/18/18 0446  WBC 8.3 12.2*  --  11.4* 10.1  NEUTROABS  --   --   --  8.4*  --   HGB 10.0* 10.6* 11.2* 10.9* 8.9*  HCT 31.9* 34.6* 33.0* 37.0 29.9*  MCV  87 90.3  --  91.8 92.9  PLT 372 376  --  425* 993   Basic Metabolic Panel: Recent Labs  Lab 12/13/18 0000  12/15/18 2159  12/16/18 0410 12/16/18 0702 12/16/18 1150 12/17/18 1412 12/17/18 2151 12/18/18 0446  NA  --    < > 143   < > 142 142 141 138  --  141  K  --    < > 3.3*   < > 3.2* 3.5 3.6 3.3*  --  3.6  CL  --    < > 107   < > 106 107 106 103  --  107  CO2  --    < > 24   < > 26 26 26 23   --  27  GLUCOSE  --    < > 196*   < > 197* 239* 319* 319*  --  253*  BUN  --    < > 17   < > 18 18 18 19   --  18  CREATININE  --    < > 1.62*   < > 1.71* 1.64* 1.60* 1.60*  --  1.50*  CALCIUM  --    < > 8.8*   < > 8.8* 8.6* 8.9 9.1  --  8.3*  MG 1.3*  --  1.6*  --   --   --   --   --  1.7  --    < > = values in this interval not displayed.   GFR: Estimated Creatinine Clearance: 64.7 mL/min (A) (by C-G formula based on SCr of 1.5 mg/dL (H)). Liver Function Tests: Recent Labs  Lab 12/15/18 0945 12/17/18 1412  AST 23 26  ALT 24 24  ALKPHOS 102 103  BILITOT 0.8 0.5  PROT 7.4 8.0  ALBUMIN 3.3* 3.6   Recent Labs  Lab 12/15/18 0945 12/17/18 1412  LIPASE 28 23   No results for input(s): AMMONIA in the last 168 hours. Coagulation Profile: Recent Labs  Lab  12/15/18 0945  INR 1.2   Cardiac Enzymes: No results for input(s): CKTOTAL, CKMB, CKMBINDEX, TROPONINI in the last 168 hours. BNP (last 3 results) No results for input(s): PROBNP in the last 8760 hours. HbA1C: No results for input(s): HGBA1C in the last 72 hours. CBG: Recent Labs  Lab 12/16/18 1702 12/17/18 1339 12/17/18 2210 12/18/18 0309 12/18/18 0856  GLUCAP 260* 302* 282* 240* 295*   Lipid Profile: No results for input(s): CHOL, HDL, LDLCALC, TRIG, CHOLHDL, LDLDIRECT in the last 72 hours. Thyroid Function Tests: No results for input(s): TSH, T4TOTAL, FREET4, T3FREE, THYROIDAB in the last 72 hours. Anemia Panel: No results for input(s): VITAMINB12, FOLATE, FERRITIN, TIBC, IRON, RETICCTPCT in the last 72 hours. Urine analysis:    Component Value Date/Time   COLORURINE YELLOW 12/17/2018 1326   APPEARANCEUR HAZY (A) 12/17/2018 1326   LABSPEC 1.022 12/17/2018 1326   PHURINE 5.0 12/17/2018 1326   GLUCOSEU >=500 (A) 12/17/2018 1326   HGBUR MODERATE (A) 12/17/2018 1326   BILIRUBINUR NEGATIVE 12/17/2018 1326   [place] 12/17/2018 1326   PROTEINUR >=300 (A) 12/17/2018 1326   UROBILINOGEN 0.2 10/02/2013 2108   NITRITE NEGATIVE 12/17/2018 1326   LEUKOCYTESUR NEGATIVE 12/17/2018 1326     Rowena Moilanen M.D. [facility] 12/18/2018, 11:38 AM  Pager: [phone] Between 7am to 7pm - call Pager - [phone]  After 7pm go to [email or web address] - password [email or web address]  Call night coverage person covering after 7pm

## 2018-12-19 DIAGNOSIS — I5032 Chronic diastolic (congestive) heart failure: Secondary | ICD-10-CM | POA: Insufficient documentation

## 2018-12-19 DIAGNOSIS — I5022 Chronic systolic (congestive) heart failure: Secondary | ICD-10-CM | POA: Insufficient documentation

## 2018-12-19 LAB — BASIC METABOLIC PANEL
Anion gap: 9 (ref 5–15)
BUN: 12 mg/dL (ref 6–20)
CO2: 24 mmol/L (ref 22–32)
Calcium: 8.5 mg/dL — ABNORMAL LOW (ref 8.9–10.3)
Chloride: 106 mmol/L (ref 98–111)
Creatinine, Ser: 1.4 mg/dL — ABNORMAL HIGH (ref 0.44–1.00)
GFR calc Af Amer: 49 mL/min — ABNORMAL LOW (ref >60)
GFR calc non Af Amer: 42 mL/min — ABNORMAL LOW (ref >60)
Glucose, Bld: 175 mg/dL — ABNORMAL HIGH (ref 70–99)
Potassium: 3.4 mmol/L — ABNORMAL LOW (ref 3.5–5.1)
Sodium: 139 mmol/L (ref 135–145)

## 2018-12-19 LAB — CBC
HCT: 31.6 % — ABNORMAL LOW (ref 36.0–46.0)
Hemoglobin: 9.6 g/dL — ABNORMAL LOW (ref 12.0–15.0)
MCH: 27.4 pg (ref 26.0–34.0)
MCHC: 30.4 g/dL (ref 30.0–36.0)
MCV: 90 fL (ref 80.0–100.0)
Platelets: 332 10*3/uL (ref 150–400)
RBC: 3.51 MIL/uL — ABNORMAL LOW (ref 3.87–5.11)
RDW: 14.9 % (ref 11.5–15.5)
WBC: 9.1 10*3/uL (ref 4.0–10.5)
nRBC: 0 % (ref 0.0–0.2)

## 2018-12-19 LAB — GLUCOSE, CAPILLARY
Glucose-Capillary: 230 mg/dL — ABNORMAL HIGH (ref 70–99)
Glucose-Capillary: 203 mg/dL — ABNORMAL HIGH (ref 70–99)

## 2018-12-19 LAB — MAGNESIUM: Magnesium: 1.4 mg/dL — ABNORMAL LOW (ref 1.7–2.4)

## 2018-12-19 MED ORDER — ALPRAZOLAM 0.5 MG PO TABS
0.5000 mg | ORAL_TABLET | Freq: Once | ORAL | Status: AC
  Administered 2018-12-19: 0.5 mg via ORAL
  Filled 2018-12-19: qty 1

## 2018-12-19 MED ORDER — LANTUS SOLOSTAR 100 UNIT/ML ~~LOC~~ SOPN: 45.0000 [IU] | PEN_INJECTOR | Freq: Every day | SUBCUTANEOUS | 5 refills | Status: DC

## 2018-12-19 MED ORDER — ONDANSETRON 4 MG PO TBDP: 4.0000 mg | ORAL_TABLET | Freq: Three times a day (TID) | ORAL | 0 refills | Status: DC | PRN

## 2018-12-19 MED ORDER — BLOOD GLUCOSE METER KIT: PACK | 0 refills | Status: DC

## 2018-12-19 MED ORDER — FUROSEMIDE 20 MG PO TABS: 20.0000 mg | ORAL_TABLET | Freq: Every day | ORAL | 3 refills | Status: DC

## 2018-12-19 MED ORDER — FLUTICASONE PROPIONATE 50 MCG/ACT NA SUSP: 2.0000 | Freq: Every day | NASAL | 2 refills | Status: AC | PRN

## 2018-12-19 MED ORDER — METOCLOPRAMIDE HCL 10 MG PO TABS: 10.0000 mg | ORAL_TABLET | Freq: Three times a day (TID) | ORAL | 2 refills | Status: DC

## 2018-12-19 MED ORDER — POTASSIUM CHLORIDE CRYS ER 20 MEQ PO TBCR
40.0000 meq | EXTENDED_RELEASE_TABLET | Freq: Once | ORAL | Status: DC
  Filled 2018-12-19: qty 2

## 2018-12-19 NOTE — TOC Initial Note (Signed)
Transition of Care [facility]) - Initial/Assessment Note    Patient Details  Name: Deborah [name] MRN: [id number] Date of Birth: 11/29/1965  Transition of [facility]) CM/SW Contact:    Trish Mage, LCSW Phone Number: 12/19/2018, 2:51 PM  Clinical Narrative:  Patient seen due to high risk for readmission.  Ha had her own apartment for about a week after living in ALF for over a year.  "I'm still getting used to doing everything for myself."  States she does not like to cook, that her meds are all affordable as she has both MCR and MCD, and that she remembers to take all her meds.  Has family support of mother and brothers.. Mother gives her rides to appointments.  On fixed income, also gets food stamps.  Her only concern was getting a script for her Lantus.  Assured that she would have that to take with her. She has a ride home.  TOC sign off.                 Expected Discharge Plan: Home/Self Care Barriers to Discharge: No Barriers Identified   Patient Goals and CMS Choice Patient states their goals for this hospitalization and ongoing recovery are:: "I'm ready to go home."      Expected Discharge Plan and Services Expected Discharge Plan: Home/Self Care   Discharge Planning Services: CM Consult   Living arrangements for the past 2 months: Apartment Expected Discharge Date: 12/19/18                                    Prior Living Arrangements/Services Living arrangements for the past 2 months: Apartment Lives with:: Self Patient language and need for interpreter reviewed:: Yes Do you feel safe going back to the place where you live?: Yes      Need for Family Participation in Patient Care: Yes (Comment) Care giver support system in place?: Yes (comment)   Criminal Activity/Legal Involvement Pertinent to Current Situation/Hospitalization: No - Comment as needed  Activities of Daily Living Home Assistive Devices/Equipment: None ADL Screening (condition at time of  admission) Patient's cognitive ability adequate to safely complete daily activities?: Yes Is the patient deaf or have difficulty hearing?: No Does the patient have difficulty seeing, even when wearing glasses/contacts?: No Does the patient have difficulty concentrating, remembering, or making decisions?: No Patient able to express need for assistance with ADLs?: Yes Does the patient have difficulty dressing or bathing?: No Independently performs ADLs?: Yes (appropriate for developmental age) Does the patient have difficulty walking or climbing stairs?: No Weakness of Legs: Both Weakness of Arms/Hands: None  Permission Sought/Granted                  Emotional Assessment Appearance:: Appears stated age Attitude/Demeanor/Rapport: Engaged Affect (typically observed): Guarded Orientation: : Oriented to Self, Oriented to Place, Oriented to  Time, Oriented to Situation Alcohol / Substance Use: Not Applicable Psych Involvement: No (comment)  Admission diagnosis:  Gastroparesis [K31.84] Patient Active Problem List   Diagnosis Date Noted  . Intractable nausea and vomiting 12/17/2018  . Hypoxia 12/17/2018  . Hyperglycemia 12/17/2018  . Elevated troponin I level   . Vomiting 07/18/2018  . Abdominal pain 07/17/2018  . Hyperkalemia 07/17/2018  . Cardiac arrest ([place]) 11/29/2017  . Pelvic mass 11/29/2017  . Leukocytosis 11/29/2017  . Anxiety 11/29/2017  . Allergic reaction to contrast media, initial encounter 11/28/2017  . Palliative care  encounter   . Back pain 03/19/2017  . Stroke (cerebrum) ([place]) 03/19/2017  . GERD (gastroesophageal reflux disease) 03/19/2017  . Depression 03/19/2017  . Obesity   . Urinary tract infection 08/16/2016  . Normocytic normochromic anemia 08/16/2016  . Gastroparesis 08/16/2016  . Intractable vomiting with nausea 06/17/2016  . Diabetic gastroparesis ([place]) 06/05/2016  . Gout 06/05/2016  . AKI (acute kidney injury) ([place]) 12/06/2015  . Chest pain  09/26/2015  . Hypokalemia 09/26/2015  . Hypomagnesemia 09/26/2015  . Nausea and vomiting 08/20/2015  . Gout flare 05/27/2015  . Lower abdominal pain 05/26/2015  . DKA (diabetic ketoacidoses) ([place]) 05/25/2015  . Uncontrolled type 2 diabetes mellitus with diabetic neuropathy, with long-term current use of insulin ([place]) 05/25/2015  . Type 2 diabetes mellitus with hyperglycemia, with long-term current use of insulin ([place]) 05/25/2015  . CKD (chronic kidney disease), stage II 05/25/2015  . Essential hypertension, benign 09/28/2013   PCP:  [name], DO Pharmacy:   [place], [place] - [address] [place] [address] Phone: [phone] Fax: [phone]     Social Determinants of Health (SDOH) Interventions    Readmission Risk Interventions No flowsheet data found.

## 2018-12-19 NOTE — Progress Notes (Addendum)
Inpatient Diabetes Program Recommendations  AACE/ADA: New Consensus Statement on Inpatient Glycemic Control (2015)  Target Ranges:  Prepandial:   less than 140 mg/dL      Peak postprandial:   less than 180 mg/dL (1-2 hours)      Critically ill patients:  140 - 180 mg/dL   Results for Deborah, [name] (MRN [id number]) as of 12/19/2018 09:48  Ref. Range 12/18/2018 08:56 12/18/2018 11:38 12/18/2018 17:19 12/18/2018 20:35  Glucose-Capillary Latest Ref Range: 70 - 99 mg/dL 295 (H)  5 units NOVOLOG  312 (H)  12 units NOVOLOG  296 (H)  10 units NOVOLOG  200 (H)    30 units LANTUS   Results for Deborah, [name] (MRN [id number]) as of 12/19/2018 09:48  Ref. Range 12/19/2018 07:41  Glucose-Capillary Latest Ref Range: 70 - 99 mg/dL 230 (H)  3 units NOVOLOG     Home DM Meds: Lantus 45 units QHS                             Humalog 10 units TID                             Humalog SSI  Current Orders: Lantus 30 units QHS                            Novolog Sensitive Correction Scale/ SSI (0-9 units) TID AC + HS      Novolog 5 units TID with meals    Admit 10/09 with N&V, Gastroparesis issues, CP--Left AMA 10/10--Back to ED on 10/11 with the Same Issues.  PCP: Dr. [facility]    MD- Pt stated she neds Rxs for the following at time of d/c: Lantus Insulin pen- Order # [id number] CBG meter and supplies- Order # [id number]  Also, please consider the following:  1. Increase Lantus to 40 units QHS  2. Increase Novolog Meal Coverage to: Novolog 8 units TID with meals  (Please add the following Hold Parameters: Hold if pt eats <50% of meal, Hold if pt NPO)      --Will follow patient during hospitalization--  Wyn Quaker RN, MSN, CDE Diabetes Coordinator Inpatient Glycemic Control Team Team Pager: [phone] (8a-5p)

## 2018-12-19 NOTE — Discharge Summary (Signed)
Physician Discharge Summary   Patient ID: Deborah [name] MRN: [id number] DOB/AGE: 54-Jun-1967 54 y.o.  Admit date: 12/17/2018 Discharge date: 12/19/2018  Primary Care Physician:  [name], DO   Recommendations for Outpatient Follow-up:  1. Follow up with PCP in 1-2 weeks 2. Patient recommended full liquid or soft solid diet as tolerated and progressed slowly to regular diet  Home Health: None Equipment/Devices:   Discharge Condition: stable CODE STATUS: FULL  Diet recommendation: Full liquid or soft solids   Discharge Diagnoses:    . Intractable nausea and vomiting secondary to diabetic gastroparesis  . Hypokalemia . CKD (chronic kidney disease), stage II . Diabetes mellitus type 2, IDDM, uncontrolled with gastroparesis, CKD, hyperglycemia . Essential hypertension, benign Morbid obesity Prior history of CVA Hypokalemia Mildly elevated high-sensitivity troponin  Consults: None    Allergies:   Allergies  Allergen Reactions  . Contrast Media [Iodinated Diagnostic Agents] Anaphylaxis    Cardiac arrest  . Diazepam [name]  . Isovue [Iopamidol] Anaphylaxis    Patient had seizure like activity and then code post 100 cc of isovue 300  . Lisinopril Anaphylaxis    Tongue and mouth swelling  . Penicillins Palpitations    Has patient had a PCN reaction causing immediate rash, facial/tongue/throat swelling, SOB or lightheadedness with hypotension: Yes, heart races Has patient had a PCN reaction causing severe rash involving mucus membranes or skin necrosis: No Has patient had a PCN reaction that required hospitalization: Yes  Has patient had a PCN reaction occurring within the last 10 years: No   . Acetaminophen Nausea Only and Other (See Comments)    Irritates stomach ulcer  Abdominal pain  . Tolmetin Nausea Only    Other reaction(s): Other (See Comments) ULCER  . Aspirin Other (See Comments)    Irritates stomach ulcer   . Food Hives and  Swelling    Carrots, ketchup   . Nsaids Other (See Comments)    ULCER  . Tramadol Nausea And Vomiting     DISCHARGE MEDICATIONS: Allergies as of 12/19/2018      Reactions   Contrast Media [iodinated Diagnostic Agents] Anaphylaxis   Cardiac arrest   Diazepam [name]   Isovue [iopamidol] Anaphylaxis   Patient had seizure like activity and then code post 100 cc of isovue 300   Lisinopril Anaphylaxis   Tongue and mouth swelling   Penicillins Palpitations   Has patient had a PCN reaction causing immediate rash, facial/tongue/throat swelling, SOB or lightheadedness with hypotension: Yes, heart races Has patient had a PCN reaction causing severe rash involving mucus membranes or skin necrosis: No Has patient had a PCN reaction that required hospitalization: Yes  Has patient had a PCN reaction occurring within the last 10 years: No   Acetaminophen Nausea Only, Other (See Comments)   Irritates stomach ulcer  Abdominal pain   Tolmetin Nausea Only   Other reaction(s): Other (See Comments) ULCER   Aspirin Other (See Comments)   Irritates stomach ulcer    Food Hives, Swelling   Carrots, ketchup    Nsaids Other (See Comments)   ULCER   Tramadol Nausea And Vomiting      Medication List    STOP taking these medications   insulin glargine 100 UNIT/ML injection Commonly known as: Lantus Replaced by: Lantus SoloStar 100 UNIT/ML Solostar Pen     TAKE these medications   allopurinol 100 MG tablet Commonly known as: ZYLOPRIM Take 1 tablet (100 mg total) by mouth daily.   amLODipine  10 MG tablet Commonly known as: NORVASC Take 1 tablet (10 mg total) by mouth daily.   aspirin 81 MG EC tablet Commonly known as: Ecotrin Low Strength Take 1 tablet (81 mg total) by mouth daily. Swallow whole.   atorvastatin 10 MG tablet Commonly known as: LIPITOR Take 1 tablet (10 mg total) by mouth daily.   blood glucose meter kit and supplies Dispense based on patient and insurance  preference. Use up to four times daily as directed. (FOR ICD-10 E10.9, E11.9).   blood glucose meter kit and supplies Kit Dispense based on patient and insurance preference. Use up to four times daily as directed. (FOR ICD-9 250.00, 250.01).   cetirizine 10 MG tablet Commonly known as: ZYRTEC Take 1 tablet (10 mg total) by mouth daily.   DULoxetine 30 MG capsule Commonly known as: Cymbalta Take 1 capsule (30 mg total) by mouth 2 (two) times daily.   fluticasone 50 MCG/ACT nasal spray Commonly known as: FLONASE Place 2 sprays into both nostrils daily as needed for allergies or rhinitis.   furosemide 20 MG tablet Commonly known as: LASIX Take 1 tablet (20 mg total) by mouth daily. Hold for 2 days Start taking on: December 21, 2018 What changed:   additional instructions  These instructions start on December 21, 2018. If you are unsure what to do until then, ask your doctor or other care provider.   hydrOXYzine 10 MG tablet Commonly known as: ATARAX/VISTARIL Take 1 tablet (10 mg total) by mouth 3 (three) times daily as needed. What changed: reasons to take this   insulin lispro 100 UNIT/ML injection Commonly known as: HUMALOG Inject 0.1 mLs (10 Units total) into the skin 3 (three) times daily. What changed:   when to take this  additional instructions   Lantus SoloStar 100 UNIT/ML Solostar Pen Generic drug: Insulin Glargine Inject 45 Units into the skin at bedtime. Replaces: insulin glargine 100 UNIT/ML injection   Melatonin 3 MG Tabs Take 2 tablets (6 mg total) by mouth at bedtime.   methocarbamol 500 MG tablet Commonly known as: ROBAXIN Take 500 mg by mouth 2 (two) times a day.   metoCLOPramide 10 MG tablet Commonly known as: REGLAN Take 1 tablet (10 mg total) by mouth 3 (three) times daily before meals.   metoprolol succinate 25 MG 24 hr tablet Commonly known as: TOPROL-XL Take 1 tablet (25 mg total) by mouth daily.   multivitamin with minerals Tabs  tablet Take 1 tablet by mouth daily.   nitroGLYCERIN 0.4 MG SL tablet Commonly known as: NITROSTAT Place 1 tablet (0.4 mg total) under the tongue every 5 (five) minutes as needed for chest pain.   ondansetron 4 MG disintegrating tablet Commonly known as: Zofran ODT Take 1 tablet (4 mg total) by mouth every 8 (eight) hours as needed for nausea or vomiting.   pantoprazole 40 MG tablet Commonly known as: PROTONIX Take 1 tablet (40 mg total) by mouth 2 (two) times daily.        Brief H and P: For complete details please refer to admission H and P, but in brief Deborah [name] a 53 y.o.femalewith medical history significant ofchronic pain, type 2 diabetes, GERD, gastric ulcer, gastroparesis, history of cholecystectomy, gout, hypertension, hyperlipidemia, obesity, CVA presented to ED with nausea and vomiting.  Patient was recently admitted by family medicine teaching service on 10/9 for intractable nausea and vomiting thought to be secondary to longstanding gastroparesis from poorly controlled diabetes.  Patient was hyperglycemic but not in DKA  or HHS.  Patient reported severe generalized abdominal pain with vomiting and not able to tolerate anything p.o., brief episode of substernal chest pain lasting few seconds, symptoms started night before the admission.   CT abdomen pelvis showed hepatic steatosis, no acute abnormalities.  5.1x 4.5 cm left pelvic mass corresponding to an exophytic fibroid. Patient was admitted for further work-up.  COVID-19 test negative.  Hospital Course:  Intractable nausea and vomiting likely due to acute flare of diabetic gastroparesis, GERD/dyspepsia -Lipase, LFTs normal, CT abdomen pelvis with no acute intra-abdominal pathology -Patient on IV fluid hydration, IV Reglan and Zofran.  Patient was placed on PPI -She feels a lot better today, was transitioned to solid diet and tolerated well.      Essential hypertension, benign -BP currently stable,  continue beta-blocker, Norvasc.  Patient recommended to hold off on Lasix for 2 days.    Type 2 diabetes mellitus with hyperglycemia, with long-term current use of insulin (HCC) with CKD, hyperglycemia -Uncontrolled, CBGs in 300s -Hemoglobin A1c 9.5 on 12/13/2018 -Continue Lantus 45 units at bedtime, meal coverage insulin 10 units 3 times daily AC, recommended strongly to follow-up with PCP and further adjustment of insulin regimen.     CKD (chronic kidney disease), stage II -Baseline creatinine 1.6-1.7 -Patient was placed on gentle hydration.  She was recommended to hold off on Lasix for another 2 days.  Mildly elevated high-sensitivity troponin  EKG with no acute ST-T wave changes suggestive of ischemia -Prior echo 11/2017 had shown EF of 60 to 65%, mild pulmonary hypertension. No complaints of chest pain or shortness of breath.    Hypokalemia -Replaced   History of prior CVA -Continue statin, allergic to aspirin   Morbid obesity -BMI 53.4, counseled on diet and weight control  Hyperlipidemia -Continue statin  Day of Discharge S: Feels a lot better today, diet was advanced earlier this morning.  Tolerating solids now, hoping to go home today.  BP 137/71 (BP Location: Right Arm)   Pulse 94   Temp 97.8 F (36.6 C) (Oral)   Resp 18   Ht '5\' 6"'  (1.676 m)   Wt (!) 150 kg   LMP 10/10/2012   SpO2 98%   BMI 53.37 kg/m   Physical Exam: General: Alert and awake oriented x3 not in any acute distress. HEENT: anicteric sclera, pupils reactive to light and accommodation CVS: S1-S2 clear no murmur rubs or gallops Chest: clear to auscultation bilaterally, no wheezing rales or rhonchi Abdomen: soft nontender, nondistended, normal bowel sounds Extremities: no cyanosis, clubbing or edema noted bilaterally Neuro: No new deficits   The results of significant diagnostics from this hospitalization (including imaging, microbiology, ancillary and laboratory) are listed below  for reference.      Procedures/Studies:  Ct Abdomen Pelvis Wo Contrast  Result Date: 12/17/2018 CLINICAL DATA:  Diffuse abdominal pain. Nausea vomiting. EXAM: CT ABDOMEN AND PELVIS WITHOUT CONTRAST TECHNIQUE: Multidetector CT imaging of the abdomen and pelvis was performed following the standard protocol without IV contrast. COMPARISON:  Jul 17, 2018 FINDINGS: Lower chest: No acute abnormality. Hepatobiliary: Hepatic steatosis. Post cholecystectomy. Pancreas: Unremarkable. No pancreatic ductal dilatation or surrounding inflammatory changes. Spleen: Normal in size without focal abnormality. Adrenals/Urinary Tract: Adrenal glands are unremarkable. Kidneys are normal, without renal calculi, focal lesion, or hydronephrosis. Bladder is unremarkable. Stomach/Bowel: Stomach is within normal limits. No evidence of appendicitis. No evidence of bowel wall thickening, distention, or inflammatory changes. Vascular/Lymphatic: No significant vascular findings are present. No enlarged abdominal or pelvic lymph nodes. Reproductive: 5.1  x 4.5 cm left adnexal mass. Other: No abdominal wall hernia or abnormality. No abdominopelvic ascites. Musculoskeletal: L5-S1 spondylosis. IMPRESSION: 1. Hepatic steatosis. 2. No evidence of acute abnormalities within the solid abdominal organs. 3. 5.1 x 4.5 cm left pelvic mass corresponds to a exophytic fibroid, in correlation to MRI dated November 29, 2017. Electronically Signed   By: Fidela Salisbury M.D.   On: 12/17/2018 15:23   Dg Chest Portable 1 View  Result Date: 12/17/2018 CLINICAL DATA:  Nausea and vomiting. EXAM: PORTABLE CHEST 1 VIEW COMPARISON:  Chest x-ray dated December 15, 2018. FINDINGS: The heart remains at the upper limits of normal in size. Normal pulmonary vascularity. No focal consolidation, pleural effusion, or pneumothorax. No acute osseous abnormality. IMPRESSION: No active disease. Electronically Signed   By: Titus Dubin M.D.   On: 12/17/2018 15:21   Dg  Chest Portable 1 View  Result Date: 12/15/2018 CLINICAL DATA:  Chest pain radiating to the right side today. [name]. History of hypertension and diabetes. EXAM: PORTABLE CHEST 1 VIEW COMPARISON:  Radiographs 07/19/2018 07/17/2018. FINDINGS: 0949 hours. The heart size is stable at the upper limits of normal. The lungs are clear. There is no pleural effusion or pneumothorax. Degenerative changes are present throughout the spine. There is evidence of chronic rotator cuff tears bilaterally with asymmetric glenohumeral arthropathy on the right. Telemetry leads overlie the chest. IMPRESSION: Stable borderline heart size.  No active cardiopulmonary process. Electronically Signed   By: Richardean Sale M.D.   On: 12/15/2018 10:16       LAB RESULTS: Basic Metabolic Panel: Recent Labs  Lab 12/18/18 0446 12/19/18 0538  NA 141 139  K 3.6 3.4*  CL 107 106  CO2 27 24  GLUCOSE 253* 175*  BUN 18 12  CREATININE 1.50* 1.40*  CALCIUM 8.3* 8.5*  MG  --  1.4*   Liver Function Tests: Recent Labs  Lab 12/15/18 0945 12/17/18 1412  AST 23 26  ALT 24 24  ALKPHOS 102 103  BILITOT 0.8 0.5  PROT 7.4 8.0  ALBUMIN 3.3* 3.6   Recent Labs  Lab 12/15/18 0945 12/17/18 1412  LIPASE 28 23   No results for input(s): AMMONIA in the last 168 hours. CBC: Recent Labs  Lab 12/17/18 1412 12/18/18 0446 12/19/18 0538  WBC 11.4* 10.1 9.1  NEUTROABS 8.4*  --   --   HGB 10.9* 8.9* 9.6*  HCT 37.0 29.9* 31.6*  MCV 91.8 92.9 90.0  PLT 425* 320 332   Cardiac Enzymes: No results for input(s): CKTOTAL, CKMB, CKMBINDEX, TROPONINI in the last 168 hours. BNP: Invalid input(s): POCBNP CBG: Recent Labs  Lab 12/19/18 0741 12/19/18 1203  GLUCAP 230* 203*      Disposition and Follow-up: Discharge Instructions    Diet Carb Modified   Complete by: As directed    Discharge instructions   Complete by: As directed    Soft diet or full liquid as tolerated   Increase activity slowly   Complete by:  As directed        DISPOSITION: Home   DISCHARGE FOLLOW-UP Follow-up Information    [name], DO. Schedule an appointment as soon as possible for a visit in 2 week(s).   Specialty: Family Medicine Contact information: [address] [place] [address] [phone]            Time coordinating discharge:  25 minutes  Signed:   Estill Cotta M.D. [facility] 12/19/2018, 2:35 PM

## 2018-12-19 NOTE — Assessment & Plan Note (Signed)
Uncontrolled. Cannot tolerate metformin but will try to maximize orals since she is already on a large dose of insulin. She does state she has been out of her insulin recently as the rehab center would not "give her any" so will refill today - At next visit start either GLP-1 or SGLT-2 to help lower A1c - Referral to endocrinologist as she reports her blood sugar has been difficult to control in the past - Refilled Lantus, Novolog, and glucose monitoring kit

## 2018-12-19 NOTE — Assessment & Plan Note (Signed)
No active symptoms of chest pain, pressure, difficulty breathing today. No sounds of crackles in the lungs and does not appear to be fluid overloaded. Mild edema in both legs but unclear if this is baseline for her as this is my first time seeing her. - Referral to cardiology to manage her diastolic heart failure - Cont metoprolol - Cont Lasix - Elevate legs at night - wear compression stockings

## 2018-12-19 NOTE — Assessment & Plan Note (Signed)
Borderline controlled. Patient is not checking at home. She cannot tolerate ACE/ARB. If continues to be poorly controlled will consider adding hydralazine. - Refilled amlodipine 10mg  daily - Refilled Lasix 20mg  daily - Started metoprolol today - Patient to start checking BP at home in am - f/u with me in one month.

## 2018-12-22 ENCOUNTER — Other Ambulatory Visit: Payer: Self-pay

## 2018-12-22 ENCOUNTER — Telehealth: Payer: Self-pay | Admitting: *Deleted

## 2018-12-22 NOTE — Telephone Encounter (Signed)
Will from [facility] calling for OT verbal orders as follows:  2 time(s) weekly for 3 week(s)  You can leave verbal orders on confidential voicemail.   Also for a tub transfer bench. ( PCP can place and then message RN team and we will route to [facility]) Christen Bame, CMA

## 2018-12-26 ENCOUNTER — Other Ambulatory Visit: Payer: Self-pay

## 2018-12-26 ENCOUNTER — Ambulatory Visit (INDEPENDENT_AMBULATORY_CARE_PROVIDER_SITE_OTHER): Payer: Medicare Other | Admitting: Endocrinology

## 2018-12-26 ENCOUNTER — Encounter: Payer: Self-pay | Admitting: Endocrinology

## 2018-12-26 VITALS — BP 124/72 | HR 111 | Ht 66.0 in

## 2018-12-26 DIAGNOSIS — Z794 Long term (current) use of insulin: Secondary | ICD-10-CM | POA: Diagnosis not present

## 2018-12-26 DIAGNOSIS — E1165 Type 2 diabetes mellitus with hyperglycemia: Secondary | ICD-10-CM | POA: Diagnosis not present

## 2018-12-26 DIAGNOSIS — I639 Cerebral infarction, unspecified: Secondary | ICD-10-CM | POA: Diagnosis not present

## 2018-12-26 MED ORDER — TOUJEO MAX SOLOSTAR 300 UNIT/ML ~~LOC~~ SOPN: 130.0000 [IU] | PEN_INJECTOR | SUBCUTANEOUS | 11 refills | Status: DC

## 2018-12-26 NOTE — Telephone Encounter (Signed)
Did you place one for the tub transfer bench?  If so I cant find it, let me know.  Thanks  Christen Bame, CMA

## 2018-12-26 NOTE — Progress Notes (Signed)
Subjective:    Patient ID: Deborah [name], female    DOB: [birth year], 53 y.o.   MRN: [id number]  HPI pt is referred by for diabetes.  Pt states DM was dx'ed in 2010; she has mild if any neuropathy of the lower extremities; however, she has associated CVA, renal insuff, and gastroparesis; she has been on insulin since 2013; pt says her diet is good, but exercise is limited by back pain; she has never had GDM, pancreatitis, pancreatic surgery, or severe hypoglycemia.  She had DKA in 2017.  She takes Lantus, 45 units QHS, and humalog (30 units 3 times a day (just before each meal).  Pt says she seldom misses the insulin.  she brings a record of her cbg's which I have reviewed today.  cbg varies from 191-459.  She recently came back home from [facility].     Past Medical History:  Diagnosis Date  . Acute back pain with sciatica, left   . Acute back pain with sciatica, right   . Anemia, unspecified   . Chest pain 12/2015  . Chronic pain   . Diabetes mellitus   . Esophageal reflux   . Fibromyalgia   . Gastric ulcer   . Gastroparesis   . Gout   . Hyperlipidemia   . Hypertension   . Lumbosacral stenosis   . Obesity   . Pneumonia   . [name]   . Stroke ([place]) 02/2011  . Vitamin B12 deficiency anemia     Past Surgical History:  Procedure Laterality Date  . CATARACT EXTRACTION  01/2014  . CHOLECYSTECTOMY      Social History   Socioeconomic History  . Marital status: Single    Spouse name: Not on file  . Number of children: Not on file  . Years of education: Not on file  . Highest education level: Not on file  Occupational History  . Not on file  Social Needs  . Financial resource strain: Not on file  . Food insecurity    Worry: Not on file    Inability: Not on file  . Transportation needs    Medical: Not on file    Non-medical: Not on file  Tobacco Use  . Smoking status: Never Smoker  . Smokeless tobacco: Never Used  Substance and Sexual Activity  . Alcohol  use: No  . Drug use: No  . Sexual activity: Not Currently    Birth control/protection: None  Lifestyle  . Physical activity    Days per week: Not on file    Minutes per session: Not on file  . Stress: Not on file  Relationships  . Social Herbalist on phone: Not on file    Gets together: Not on file    Attends religious service: Not on file    Active member of club or organization: Not on file    Attends meetings of clubs or organizations: Not on file    Relationship status: Not on file  . Intimate partner violence    Fear of current or ex partner: Not on file    Emotionally abused: Not on file    Physically abused: Not on file    Forced sexual activity: Not on file  Other Topics Concern  . Not on file  Social History Narrative   ** Merged History Encounter **        Current Outpatient Medications on File Prior to Visit  Medication Sig Dispense Refill  . allopurinol (  ZYLOPRIM) 100 MG tablet Take 1 tablet (100 mg total) by mouth daily. 30 tablet 0  . amLODipine (NORVASC) 10 MG tablet Take 1 tablet (10 mg total) by mouth daily. 30 tablet 2  . aspirin (ECOTRIN LOW STRENGTH) 81 MG EC tablet Take 1 tablet (81 mg total) by mouth daily. Swallow whole. (Patient taking differently: Take 81 mg by mouth as needed. Swallow whole. ) 30 tablet 11  . atorvastatin (LIPITOR) 10 MG tablet Take 1 tablet (10 mg total) by mouth daily. 30 tablet 11  . blood glucose meter kit and supplies KIT Dispense based on patient and insurance preference. Use up to four times daily as directed. (FOR ICD-9 250.00, 250.01). 1 each 0  . cetirizine (ZYRTEC) 10 MG tablet Take 1 tablet (10 mg total) by mouth daily. 30 tablet 11  . DULoxetine (CYMBALTA) 30 MG capsule Take 1 capsule (30 mg total) by mouth 2 (two) times daily. 60 capsule 3  . fluticasone (FLONASE) 50 MCG/ACT nasal spray Place 2 sprays into both nostrils daily as needed for allergies or rhinitis. 16 g 2  . furosemide (LASIX) 20 MG tablet Take 1  tablet (20 mg total) by mouth daily. Hold for 2 days (Patient taking differently: Take 20 mg by mouth daily. ) 30 tablet 3  . hydrOXYzine (ATARAX/VISTARIL) 10 MG tablet Take 1 tablet (10 mg total) by mouth 3 (three) times daily as needed. (Patient taking differently: Take 10 mg by mouth 3 (three) times daily as needed for itching. ) 30 tablet 0  . Melatonin 3 MG TABS Take 2 tablets (6 mg total) by mouth at bedtime. 30 tablet 6  . methocarbamol (ROBAXIN) 500 MG tablet Take 500 mg by mouth 2 (two) times a day.    . metoCLOPramide (REGLAN) 10 MG tablet Take 1 tablet (10 mg total) by mouth 3 (three) times daily before meals. 90 tablet 2  . metoprolol succinate (TOPROL-XL) 25 MG 24 hr tablet Take 1 tablet (25 mg total) by mouth daily. 30 tablet 2  . Multiple Vitamin (MULTIVITAMIN WITH MINERALS) TABS tablet Take 1 tablet by mouth daily.    . nitroGLYCERIN (NITROSTAT) 0.4 MG SL tablet Place 1 tablet (0.4 mg total) under the tongue every 5 (five) minutes as needed for chest pain. 30 tablet 1  . ondansetron (ZOFRAN ODT) 4 MG disintegrating tablet Take 1 tablet (4 mg total) by mouth every 8 (eight) hours as needed for nausea or vomiting. 20 tablet 0  . pantoprazole (PROTONIX) 40 MG tablet Take 1 tablet (40 mg total) by mouth 2 (two) times daily. 60 tablet 1   No current facility-administered medications on file prior to visit.     Allergies  Allergen Reactions  . Contrast Media [Iodinated Diagnostic Agents] Anaphylaxis    Cardiac arrest  . Diazepam [name]  . Isovue [Iopamidol] Anaphylaxis    Patient had seizure like activity and then code post 100 cc of isovue 300  . Lisinopril Anaphylaxis    Tongue and mouth swelling  . Penicillins Palpitations    Has patient had a PCN reaction causing immediate rash, facial/tongue/throat swelling, SOB or lightheadedness with hypotension: Yes, heart races Has patient had a PCN reaction causing severe rash involving mucus membranes or skin necrosis: No  Has patient had a PCN reaction that required hospitalization: Yes  Has patient had a PCN reaction occurring within the last 10 years: No   . Acetaminophen Nausea Only and Other (See Comments)    Irritates stomach ulcer  Abdominal  pain  . Tolmetin Nausea Only    Other reaction(s): Other (See Comments) ULCER  . Aspirin Other (See Comments)    Irritates stomach ulcer   . Food Hives and Swelling    Carrots, ketchup   . Nsaids Other (See Comments)    ULCER  . Tramadol Nausea And Vomiting    Family History  Problem Relation Age of Onset  . Diabetes Mother   . Diabetes Father   . Heart disease Father   . Diabetes Sister   . Congestive Heart Failure Sister 60  . Diabetes Brother    BP 124/72 (BP Location: Left Wrist, Patient Position: Sitting, Cuff Size: Large)   Pulse (!) 111   Ht _0  (1.676 m)   LMP 10/10/2012   SpO2 97%   BMI 53.37 kg/m   Review of Systems denies weight loss, blurry vision, headache, chest pain, sob, urinary frequency, muscle cramps, hypoglycemia, excessive diaphoresis, memory loss, cold intolerance, rhinorrhea, and easy bruising.  N/v is better over the past few days.  No change in chronic depression.     Objective:   Physical Exam VS: see vs page GEN: no distress HEAD: head: no deformity eyes: no periorbital swelling, no proptosis external nose and ears are normal NECK: supple, thyroid is not enlarged CHEST WALL: no deformity LUNGS: clear to auscultation CV: reg rate and rhythm, no murmur ABD: abdomen is soft, nontender.  no hepatosplenomegaly.  not distended.  no hernia MUSCULOSKELETAL: muscle bulk and strength are grossly normal.  no obvious joint swelling.  gait is steady, with a cane EXTEMITIES: no deformity.  no ulcer on the feet, but the skin is dry.  feet are of normal color and temp.  1+ bilat leg edema.  there is bilateral onychomycosis of the toenails.   PULSES: dorsalis pedis intact bilat.  no carotid bruit NEURO:  cn 2-12 grossly  intact.   readily moves all 4's.  sensation is intact to touch on the feet.   SKIN:  Normal texture and temperature.  No rash or suspicious lesion is visible.   NODES:  None palpable at the neck PSYCH: alert, well-oriented.  Does not appear anxious nor depressed.    Lab Results  Component Value Date   HGBA1C 9.5 (H) 12/13/2018   Lab Results  Component Value Date   CREATININE 1.40 (H) 12/19/2018   BUN 12 12/19/2018   NA 139 12/19/2018   K 3.4 (L) 12/19/2018   CL 106 12/19/2018   CO2 24 12/19/2018   I have reviewed outside records, and summarized: Pt was noted to have elevated a1c, and referred here.  She has had numerous admissions and ER visits due to gastroparesis.    Lab Results  Component Value Date   TSH 2.381 07/18/2018   T4TOTAL 8.7 10/01/2013   Lab Results  Component Value Date   HGBA1C 9.5 (H) 12/13/2018   I personally reviewed electrocardiogram tracing (12/17/18): Indication: vomiting.  Impression: ST with 1 PVC.  No MI.  No hypertrophy.   Compared to 12/15/18: no significant change.      Assessment & Plan:  Type 1 DM, with gastroparesis, new to me: severe exacerbation DKA: this is evidence of noncompliance, so she should be on QD insulin.   Patient Instructions  good diet and exercise significantly improve the control of your diabetes.  please let me know if you wish to be referred to a dietician.  high blood sugar is very risky to your health.  you should see an eye  doctor and dentist every year.  It is very important to get all recommended vaccinations.  Controlling your blood pressure and cholesterol drastically reduces the damage diabetes does to your body.  Those who smoke should quit.  Please discuss these with your doctor.  check your blood sugar 3 times a day a day.  vary the time of day when you check, between before the 3 meals, and at bedtime.  also check if you have symptoms of your blood sugar being too high or too low.  please keep a record of the  readings and bring it to your next appointment here (or you can bring the meter itself).  You can write it on any piece of paper.  please call us sooner if your blood sugar goes below 70, or if you have a lot of readings over 200.   For now, please:  change the Lantus to "Toujeo." 130 units each morning, and:  Stop taking the humalog. Please come back for a follow-up appointment in 2 weeks.

## 2018-12-26 NOTE — Patient Instructions (Addendum)
good diet and exercise significantly improve the control of your diabetes.  please let me know if you wish to be referred to a dietician.  high blood sugar is very risky to your health.  you should see an eye doctor and dentist every year.  It is very important to get all recommended vaccinations.  Controlling your blood pressure and cholesterol drastically reduces the damage diabetes does to your body.  Those who smoke should quit.  Please discuss these with your doctor.  check your blood sugar 3 times a day a day.  vary the time of day when you check, between before the 3 meals, and at bedtime.  also check if you have symptoms of your blood sugar being too high or too low.  please keep a record of the readings and bring it to your next appointment here (or you can bring the meter itself).  You can write it on any piece of paper.  please call us sooner if your blood sugar goes below 70, or if you have a lot of readings over 200.   For now, please:  change the Lantus to "Toujeo." 130 units each morning, and:  Stop taking the humalog. Please come back for a follow-up appointment in 2 weeks.

## 2018-12-27 ENCOUNTER — Other Ambulatory Visit: Payer: Self-pay | Admitting: Family Medicine

## 2018-12-27 DIAGNOSIS — I639 Cerebral infarction, unspecified: Secondary | ICD-10-CM

## 2018-12-27 NOTE — Progress Notes (Signed)
DME order for tub transfer bench

## 2018-12-27 NOTE — Telephone Encounter (Signed)
Received by [facility] and will be processed now. Christen Bame, CMA

## 2018-12-27 NOTE — Telephone Encounter (Signed)
Community message sent to [employer] and Darlina Guys @ [facility] to process DME order for Tub transfer bench.Christen Bame, CMA

## 2018-12-28 ENCOUNTER — Telehealth: Payer: Self-pay | Admitting: *Deleted

## 2018-12-28 NOTE — Telephone Encounter (Signed)
[facility] health nurse calls to report that pt is having pain 8/10 in her legs.  She reports being able to elevate her feet (still with 1+ pitting per Olivia Mackie).  Pt states that the "compression stockings have not worked for me in the past" therefore, she has not picked any up.  Pt is requesting something for the pain in her legs.   To PCP.  Christen Bame, CMA

## 2018-12-28 NOTE — Telephone Encounter (Signed)
Pt was informed and appt made for 01/23/19 (does not want to see diff provider)  Also she does not want to go back to see Dr. Loanne Drilling.  She would like a different endocrinologist.  She states that he changed her insulin to a dose that she is not comfortable with.  Dr.Lockamy, Are you ok with Kennyth Lose sending her to a different endocrinologist?

## 2019-01-01 ENCOUNTER — Telehealth: Payer: Self-pay | Admitting: Family Medicine

## 2019-01-01 NOTE — Telephone Encounter (Signed)
To [place]. Christen Bame, CMA

## 2019-01-01 NOTE — Telephone Encounter (Signed)
Reviewed form and placed in PCP's box for completion.  .Ignatius Kloos R Tyheim Vanalstyne, CMA  

## 2019-01-01 NOTE — Telephone Encounter (Signed)
Patients mom came into office to drop off [place] [facility] for patient's PCP to fill out. Pt's last appointment was on 12-13-2018, and forms were placed in red team folder.

## 2019-01-04 ENCOUNTER — Ambulatory Visit: Payer: Medicaid Other | Admitting: Family Medicine

## 2019-01-11 ENCOUNTER — Ambulatory Visit: Payer: Medicare Other | Admitting: Endocrinology

## 2019-01-15 NOTE — Telephone Encounter (Signed)
Called patient as I was not aware that form needed to be faxed to [facility]; patient became irate and started yelling when informed that she could pick up form as it had been completed by Dr. Garlan Fillers.  Located form in the drawer up front and faxed to [employer] at [phone].  Will call and inform patient.  Deborah [name], [place]

## 2019-01-23 ENCOUNTER — Other Ambulatory Visit: Payer: Self-pay

## 2019-01-23 ENCOUNTER — Telehealth (INDEPENDENT_AMBULATORY_CARE_PROVIDER_SITE_OTHER): Payer: Medicare Other | Admitting: Family Medicine

## 2019-01-23 DIAGNOSIS — M79606 Pain in leg, unspecified: Secondary | ICD-10-CM

## 2019-01-23 DIAGNOSIS — R0981 Nasal congestion: Secondary | ICD-10-CM

## 2019-01-23 DIAGNOSIS — I639 Cerebral infarction, unspecified: Secondary | ICD-10-CM | POA: Diagnosis not present

## 2019-01-23 MED ORDER — AZELASTINE HCL 0.1 % NA SOLN: 2.0000 | Freq: Two times a day (BID) | NASAL | 12 refills | Status: DC

## 2019-01-23 MED ORDER — DULOXETINE HCL 30 MG PO CPEP: 30.0000 mg | ORAL_CAPSULE | Freq: Two times a day (BID) | ORAL | 3 refills | Status: DC

## 2019-01-23 MED ORDER — INSULIN ASPART 100 UNIT/ML ~~LOC~~ SOLN: 30.0000 [IU] | Freq: Three times a day (TID) | SUBCUTANEOUS | 11 refills | Status: DC

## 2019-01-23 NOTE — Progress Notes (Signed)
[facility] Visit  Patient consented to have virtual visit. Method of visit: Video was attempted, but technology challenges prevented patient from using video, so visit was conducted via telephone.  Encounter participants: Patient: Deborah [name] - located at home in [facility] Provider: [name] - located at [facility] Others (if applicable): none  Chief Complaint: congestion and medication adjustment  HPI:  Patient is medically complex 53y/o female with multiple chronic disease presenting today for nasal congestion x 1 week with productive cough, thick yellow discharge. SOB but no difficulty breathing. She is also having difficult anxiety and requests to be restarted on her ativan. I politely reminded her this is a dangerous medication and does not fix anxiety so we are restarting her Duloxetine.   ROS: per HPI  Pertinent PMHx: HTN, Stroke, CHF, GERD, T2DM, CKD, Anxiety  Exam:  Respiratory: speaks in full sentences, not stopping or needing to stop to catch a breath  Assessment/Plan:  Congestion of nasal sinus Allergies most likely vs acute viral illness but no other viral symptoms. - Patient has flonase and instructed her to take it daily for several weeks - Added astelin 0.1% nasal spray to help with congestion.    Time spent during visit with patient: >15 minutes  Harolyn Rutherford, [place], PGY-3

## 2019-01-24 DIAGNOSIS — R0981 Nasal congestion: Secondary | ICD-10-CM | POA: Insufficient documentation

## 2019-01-24 NOTE — Assessment & Plan Note (Signed)
Allergies most likely vs acute viral illness but no other viral symptoms. - Patient has flonase and instructed her to take it daily for several weeks - Added astelin 0.1% nasal spray to help with congestion.

## 2019-01-26 ENCOUNTER — Observation Stay (HOSPITAL_BASED_OUTPATIENT_CLINIC_OR_DEPARTMENT_OTHER): Payer: Medicare Other

## 2019-01-26 ENCOUNTER — Emergency Department (HOSPITAL_COMMUNITY): Payer: Medicare Other

## 2019-01-26 ENCOUNTER — Other Ambulatory Visit: Payer: Self-pay

## 2019-01-26 ENCOUNTER — Encounter (HOSPITAL_COMMUNITY): Payer: Self-pay | Admitting: Internal Medicine

## 2019-01-26 ENCOUNTER — Inpatient Hospital Stay (HOSPITAL_COMMUNITY)
Admission: EM | Admit: 2019-01-26 | Discharge: 2019-01-29 | DRG: 206 | Disposition: A | Discharge status: Home / Self Care | Payer: Medicare Other | Attending: Family Medicine | Admitting: Family Medicine

## 2019-01-26 DIAGNOSIS — IMO0002 Reserved for concepts with insufficient information to code with codable children: Secondary | ICD-10-CM

## 2019-01-26 DIAGNOSIS — K219 Gastro-esophageal reflux disease without esophagitis: Secondary | ICD-10-CM | POA: Diagnosis present

## 2019-01-26 DIAGNOSIS — I469 Cardiac arrest, cause unspecified: Secondary | ICD-10-CM | POA: Diagnosis present

## 2019-01-26 DIAGNOSIS — Z7982 Long term (current) use of aspirin: Secondary | ICD-10-CM

## 2019-01-26 DIAGNOSIS — M940 Chondrocostal junction syndrome [Tietze]: Secondary | ICD-10-CM | POA: Diagnosis not present

## 2019-01-26 DIAGNOSIS — R079 Chest pain, unspecified: Secondary | ICD-10-CM

## 2019-01-26 DIAGNOSIS — E876 Hypokalemia: Secondary | ICD-10-CM | POA: Diagnosis present

## 2019-01-26 DIAGNOSIS — E1122 Type 2 diabetes mellitus with diabetic chronic kidney disease: Secondary | ICD-10-CM | POA: Diagnosis present

## 2019-01-26 DIAGNOSIS — I10 Essential (primary) hypertension: Secondary | ICD-10-CM | POA: Diagnosis present

## 2019-01-26 DIAGNOSIS — Z8673 Personal history of transient ischemic attack (TIA), and cerebral infarction without residual deficits: Secondary | ICD-10-CM

## 2019-01-26 DIAGNOSIS — Z794 Long term (current) use of insulin: Secondary | ICD-10-CM

## 2019-01-26 DIAGNOSIS — M797 Fibromyalgia: Secondary | ICD-10-CM | POA: Diagnosis present

## 2019-01-26 DIAGNOSIS — G8929 Other chronic pain: Secondary | ICD-10-CM | POA: Diagnosis present

## 2019-01-26 DIAGNOSIS — Z8674 Personal history of sudden cardiac arrest: Secondary | ICD-10-CM

## 2019-01-26 DIAGNOSIS — E785 Hyperlipidemia, unspecified: Secondary | ICD-10-CM | POA: Diagnosis present

## 2019-01-26 DIAGNOSIS — Z833 Family history of diabetes mellitus: Secondary | ICD-10-CM

## 2019-01-26 DIAGNOSIS — E1165 Type 2 diabetes mellitus with hyperglycemia: Secondary | ICD-10-CM | POA: Diagnosis present

## 2019-01-26 DIAGNOSIS — Z79899 Other long term (current) drug therapy: Secondary | ICD-10-CM

## 2019-01-26 DIAGNOSIS — K3184 Gastroparesis: Secondary | ICD-10-CM | POA: Diagnosis present

## 2019-01-26 DIAGNOSIS — Z87892 Personal history of anaphylaxis: Secondary | ICD-10-CM

## 2019-01-26 DIAGNOSIS — Z20828 Contact with and (suspected) exposure to other viral communicable diseases: Secondary | ICD-10-CM | POA: Diagnosis present

## 2019-01-26 DIAGNOSIS — E1140 Type 2 diabetes mellitus with diabetic neuropathy, unspecified: Secondary | ICD-10-CM

## 2019-01-26 DIAGNOSIS — R197 Diarrhea, unspecified: Secondary | ICD-10-CM | POA: Diagnosis present

## 2019-01-26 DIAGNOSIS — M109 Gout, unspecified: Secondary | ICD-10-CM | POA: Diagnosis present

## 2019-01-26 DIAGNOSIS — Z8249 Family history of ischemic heart disease and other diseases of the circulatory system: Secondary | ICD-10-CM

## 2019-01-26 DIAGNOSIS — E1143 Type 2 diabetes mellitus with diabetic autonomic (poly)neuropathy: Secondary | ICD-10-CM | POA: Diagnosis present

## 2019-01-26 DIAGNOSIS — N1832 Chronic kidney disease, stage 3b: Secondary | ICD-10-CM | POA: Diagnosis present

## 2019-01-26 DIAGNOSIS — E669 Obesity, unspecified: Secondary | ICD-10-CM | POA: Diagnosis present

## 2019-01-26 DIAGNOSIS — F419 Anxiety disorder, unspecified: Secondary | ICD-10-CM | POA: Diagnosis present

## 2019-01-26 DIAGNOSIS — Z886 Allergy status to analgesic agent status: Secondary | ICD-10-CM

## 2019-01-26 DIAGNOSIS — I1310 Hypertensive heart and chronic kidney disease without heart failure, with stage 1 through stage 4 chronic kidney disease, or unspecified chronic kidney disease: Secondary | ICD-10-CM | POA: Diagnosis present

## 2019-01-26 LAB — HEPATIC FUNCTION PANEL
ALT: 14 U/L (ref 0–44)
AST: 14 U/L — ABNORMAL LOW (ref 15–41)
Albumin: 2.9 g/dL — ABNORMAL LOW (ref 3.5–5.0)
Alkaline Phosphatase: 85 U/L (ref 38–126)
Bilirubin, Direct: <0.1 mg/dL (ref 0.0–0.2)
Total Bilirubin: 0.9 mg/dL (ref 0.3–1.2)
Total Protein: 6.4 g/dL — ABNORMAL LOW (ref 6.5–8.1)

## 2019-01-26 LAB — BASIC METABOLIC PANEL
Anion gap: 13 (ref 5–15)
BUN: 14 mg/dL (ref 6–20)
CO2: 25 mmol/L (ref 22–32)
Calcium: 8.4 mg/dL — ABNORMAL LOW (ref 8.9–10.3)
Chloride: 96 mmol/L — ABNORMAL LOW (ref 98–111)
Creatinine, Ser: 1.55 mg/dL — ABNORMAL HIGH (ref 0.44–1.00)
GFR calc Af Amer: 44 mL/min — ABNORMAL LOW (ref >60)
GFR calc non Af Amer: 38 mL/min — ABNORMAL LOW (ref >60)
Glucose, Bld: 369 mg/dL — ABNORMAL HIGH (ref 70–99)
Potassium: 2.9 mmol/L — ABNORMAL LOW (ref 3.5–5.1)
Sodium: 134 mmol/L — ABNORMAL LOW (ref 135–145)

## 2019-01-26 LAB — CBC
HCT: 32 % — ABNORMAL LOW (ref 36.0–46.0)
Hemoglobin: 10.1 g/dL — ABNORMAL LOW (ref 12.0–15.0)
MCH: 27.9 pg (ref 26.0–34.0)
MCHC: 31.6 g/dL (ref 30.0–36.0)
MCV: 88.4 fL (ref 80.0–100.0)
Platelets: 374 10*3/uL (ref 150–400)
RBC: 3.62 MIL/uL — ABNORMAL LOW (ref 3.87–5.11)
RDW: 14.1 % (ref 11.5–15.5)
WBC: 13.2 10*3/uL — ABNORMAL HIGH (ref 4.0–10.5)
nRBC: 0 % (ref 0.0–0.2)

## 2019-01-26 LAB — GLUCOSE, CAPILLARY: Glucose-Capillary: 300 mg/dL — ABNORMAL HIGH (ref 70–99)

## 2019-01-26 LAB — SARS CORONAVIRUS 2 (TAT 6-24 HRS): SARS Coronavirus 2: NEGATIVE

## 2019-01-26 LAB — LIPID PANEL
Cholesterol: 100 mg/dL (ref 0–200)
HDL: 36 mg/dL — ABNORMAL LOW (ref >40)
LDL Cholesterol: 35 mg/dL (ref 0–99)
Total CHOL/HDL Ratio: 2.8 RATIO
Triglycerides: 147 mg/dL (ref <150)
VLDL: 29 mg/dL (ref 0–40)

## 2019-01-26 LAB — I-STAT BETA HCG BLOOD, ED (MC, WL, AP ONLY): I-stat hCG, quantitative: <5 m[IU]/mL (ref <5)

## 2019-01-26 LAB — TROPONIN I (HIGH SENSITIVITY)
Troponin I (High Sensitivity): 55 ng/L — ABNORMAL HIGH (ref <18)
Troponin I (High Sensitivity): 50 ng/L — ABNORMAL HIGH (ref <18)
Troponin I (High Sensitivity): 50 ng/L — ABNORMAL HIGH (ref <18)

## 2019-01-26 LAB — D-DIMER, QUANTITATIVE: D-Dimer, Quant: 0.52 ug/mL-FEU — ABNORMAL HIGH (ref 0.00–0.50)

## 2019-01-26 LAB — ECHOCARDIOGRAM COMPLETE

## 2019-01-26 MED ORDER — METOPROLOL SUCCINATE ER 25 MG PO TB24
25.0000 mg | ORAL_TABLET | Freq: Every day | ORAL | Status: DC
  Administered 2019-01-26 – 2019-01-29 (×4): 25 mg via ORAL
  Filled 2019-01-26 (×4): qty 1

## 2019-01-26 MED ORDER — METOCLOPRAMIDE HCL 10 MG PO TABS
10.0000 mg | ORAL_TABLET | Freq: Once | ORAL | Status: AC
  Administered 2019-01-26: 10 mg via ORAL
  Filled 2019-01-26: qty 1

## 2019-01-26 MED ORDER — SODIUM CHLORIDE 0.9 % IV BOLUS
500.0000 mL | Freq: Once | INTRAVENOUS | Status: AC
  Administered 2019-01-26: 500 mL via INTRAVENOUS

## 2019-01-26 MED ORDER — ONDANSETRON HCL 4 MG/2ML IJ SOLN: 4.0000 mg | Freq: Four times a day (QID) | INTRAMUSCULAR | Status: DC | PRN

## 2019-01-26 MED ORDER — AMLODIPINE BESYLATE 10 MG PO TABS
10.0000 mg | ORAL_TABLET | Freq: Every day | ORAL | Status: DC
  Administered 2019-01-26 – 2019-01-28 (×3): 10 mg via ORAL
  Filled 2019-01-26 (×3): qty 1

## 2019-01-26 MED ORDER — METHOCARBAMOL 500 MG PO TABS
500.0000 mg | ORAL_TABLET | Freq: Three times a day (TID) | ORAL | Status: DC
  Administered 2019-01-26 – 2019-01-27 (×3): 500 mg via ORAL
  Filled 2019-01-26 (×3): qty 1

## 2019-01-26 MED ORDER — FLUTICASONE PROPIONATE 50 MCG/ACT NA SUSP
2.0000 | Freq: Every day | NASAL | Status: DC | PRN
  Filled 2019-01-26: qty 16

## 2019-01-26 MED ORDER — HYDROXYZINE HCL 10 MG PO TABS: 10.0000 mg | ORAL_TABLET | Freq: Three times a day (TID) | ORAL | Status: DC | PRN

## 2019-01-26 MED ORDER — POTASSIUM CHLORIDE 10 MEQ/100ML IV SOLN
10.0000 meq | Freq: Once | INTRAVENOUS | Status: AC
  Administered 2019-01-26: 10 meq via INTRAVENOUS
  Filled 2019-01-26: qty 100

## 2019-01-26 MED ORDER — ALUM & MAG HYDROXIDE-SIMETH 200-200-20 MG/5ML PO SUSP: 30.0000 mL | ORAL | Status: DC | PRN

## 2019-01-26 MED ORDER — SODIUM CHLORIDE 0.9% FLUSH: 3.0000 mL | Freq: Once | INTRAVENOUS | Status: DC

## 2019-01-26 MED ORDER — FUROSEMIDE 20 MG PO TABS
20.0000 mg | ORAL_TABLET | Freq: Every day | ORAL | Status: DC
  Administered 2019-01-26 – 2019-01-29 (×4): 20 mg via ORAL
  Filled 2019-01-26 (×4): qty 1

## 2019-01-26 MED ORDER — DULOXETINE HCL 30 MG PO CPEP
30.0000 mg | ORAL_CAPSULE | Freq: Two times a day (BID) | ORAL | Status: DC
  Administered 2019-01-26 – 2019-01-29 (×6): 30 mg via ORAL
  Filled 2019-01-26 (×6): qty 1

## 2019-01-26 MED ORDER — MELATONIN 3 MG PO TABS
6.0000 mg | ORAL_TABLET | Freq: Every day | ORAL | Status: DC
  Administered 2019-01-26 – 2019-01-28 (×3): 6 mg via ORAL
  Filled 2019-01-26 (×4): qty 2

## 2019-01-26 MED ORDER — OXYCODONE HCL 5 MG PO TABS
10.0000 mg | ORAL_TABLET | Freq: Four times a day (QID) | ORAL | Status: DC | PRN
  Filled 2019-01-26: qty 2

## 2019-01-26 MED ORDER — INSULIN GLARGINE 100 UNIT/ML ~~LOC~~ SOLN
45.0000 [IU] | Freq: Every day | SUBCUTANEOUS | Status: DC
  Administered 2019-01-26 – 2019-01-28 (×3): 45 [IU] via SUBCUTANEOUS
  Filled 2019-01-26 (×4): qty 0.45

## 2019-01-26 MED ORDER — METOCLOPRAMIDE HCL 5 MG/ML IJ SOLN
10.0000 mg | Freq: Once | INTRAMUSCULAR | Status: AC
  Administered 2019-01-26: 10 mg via INTRAVENOUS
  Filled 2019-01-26: qty 2

## 2019-01-26 MED ORDER — ZOLPIDEM TARTRATE 5 MG PO TABS: 5.0000 mg | ORAL_TABLET | Freq: Every evening | ORAL | Status: DC | PRN

## 2019-01-26 MED ORDER — NITROGLYCERIN 0.4 MG SL SUBL
0.4000 mg | SUBLINGUAL_TABLET | SUBLINGUAL | Status: DC | PRN
  Administered 2019-01-28: 0.4 mg via SUBLINGUAL
  Filled 2019-01-26: qty 1

## 2019-01-26 MED ORDER — LIDOCAINE VISCOUS HCL 2 % MT SOLN: 15.0000 mL | OROMUCOSAL | Status: DC | PRN

## 2019-01-26 MED ORDER — INSULIN ASPART 100 UNIT/ML ~~LOC~~ SOLN
0.0000 [IU] | Freq: Three times a day (TID) | SUBCUTANEOUS | Status: DC
  Administered 2019-01-27: 3 [IU] via SUBCUTANEOUS
  Administered 2019-01-27: 2 [IU] via SUBCUTANEOUS
  Administered 2019-01-27: 5 [IU] via SUBCUTANEOUS
  Administered 2019-01-28: 2 [IU] via SUBCUTANEOUS
  Administered 2019-01-28: 5 [IU] via SUBCUTANEOUS

## 2019-01-26 MED ORDER — ATORVASTATIN CALCIUM 10 MG PO TABS
10.0000 mg | ORAL_TABLET | Freq: Every day | ORAL | Status: DC
  Administered 2019-01-26 – 2019-01-29 (×4): 10 mg via ORAL
  Filled 2019-01-26 (×4): qty 1

## 2019-01-26 MED ORDER — PANTOPRAZOLE SODIUM 40 MG PO TBEC
40.0000 mg | DELAYED_RELEASE_TABLET | Freq: Two times a day (BID) | ORAL | Status: DC
  Administered 2019-01-26 – 2019-01-29 (×6): 40 mg via ORAL
  Filled 2019-01-26 (×6): qty 1

## 2019-01-26 MED ORDER — INSULIN ASPART 100 UNIT/ML ~~LOC~~ SOLN
30.0000 [IU] | Freq: Three times a day (TID) | SUBCUTANEOUS | Status: DC
  Administered 2019-01-26 – 2019-01-29 (×7): 30 [IU] via SUBCUTANEOUS

## 2019-01-26 NOTE — Progress Notes (Signed)
  Echocardiogram 2D Echocardiogram has been performed.  Deborah [name] 01/26/2019, 2:55 PM

## 2019-01-26 NOTE — H&P (Signed)
History and Physical    Deborah [name]:[id number] DOB: 1965/03/26 DOA: 01/26/2019  PCP: [name], DO  Patient coming from: Home via EMS  I have personally briefly reviewed patient's old medical records in [place]  Chief Complaint: Chest pain with a history of multiple medical problems  HPI: Deborah FLEWELLEN is a 53 y.o. female with medical history significant of diabetes type 2, thrombotic stroke, chronic pain, esophageal reflux, fibromyalgia, gastroparesis, obesity, and hypertension who presents the emergency department complaining of chest pain that radiates to her right arm and shoulder.  Patient is status post cholecystectomy.  States pain began about 2 AM and is central radiating to her right shoulder.  Since center on 1117 and she has had nasal congestion for about 1 week with a productive cough and thick yellow discharge.  She is short of breath but have no difficulty breathing.  She has chronic shortness of breath.  He is having anxiety and wants to be restarted on her Ativan.  That appointment she went home and this morning at 2 AM started having chest pain.  It woke her up from sleep.  Pain is in the center of her chest radiates to her right shoulder.  Pains been constant.  It is a throbbing sensation.  It is worse with exertion.  Pain is 7 out of 10 in severity.  She has had nausea and emesis as well she estimates over 5 episodes of nonbloody nonbilious emesis.  She endorses generalized abdominal pain.  She states that her stomach feels sore after multiple episodes of vomiting.  She was given nitroglycerin by EMS with minimal symptom improvement.  She has an allergy to aspirin. Cardiac risk factors include hypertension, hyperlipidemia, and diabetes.  She denies a family history of cardiac disease.  She has no alcohol or drug use.  She has no fevers, chills, cough, shortness of breath, leg swelling, diarrhea, diaphoresis, dyspnea on exertion, loss of taste, loss  of smell, or other constitutional symptoms.  Echocardiogram on 11/30/2017 showed an LVEF of 60 to 65%.   ED Course: EKG is unchanged from prior, chest x-ray shows no infiltrate, white blood cell count of 13.2, will kalemia 2.9 repleted IV, normal anion gap with elevated blood glucose, initial troponin 55 with repeat of 50.  Troponins ranged from 25-47.  LFTs and D-dimer are pending.  Review of Systems: As per HPI otherwise all other systems reviewed and  negative.   Past Medical History:  Diagnosis Date  . Acute back pain with sciatica, left   . Acute back pain with sciatica, right   . Anemia, unspecified   . Chest pain 12/2015  . Chronic pain   . Diabetes mellitus   . Esophageal reflux   . Fibromyalgia   . Gastric ulcer   . Gastroparesis   . Gout   . Hyperlipidemia   . Hypertension   . Lumbosacral stenosis   . Obesity   . Pneumonia   . [name]   . Stroke ([place]) 02/2011  . Vitamin B12 deficiency anemia     Past Surgical History:  Procedure Laterality Date  . CATARACT EXTRACTION  01/2014  . CHOLECYSTECTOMY      Social History   Social History Narrative   ** Merged History Encounter **         reports that she has never smoked. She has never used smokeless tobacco. She reports that she does not drink alcohol or use drugs.  Allergies  Allergen Reactions  .  Contrast Media [Iodinated Diagnostic Agents] Anaphylaxis    Cardiac arrest  . Diazepam [name]  . Isovue [Iopamidol] Anaphylaxis    Patient had seizure like activity and then code post 100 cc of isovue 300  . Lisinopril Anaphylaxis    Tongue and mouth swelling  . Penicillins Palpitations    Has patient had a PCN reaction causing immediate rash, facial/tongue/throat swelling, SOB or lightheadedness with hypotension: Yes, heart races Has patient had a PCN reaction causing severe rash involving mucus membranes or skin necrosis: No Has patient had a PCN reaction that required  hospitalization: Yes  Has patient had a PCN reaction occurring within the last 10 years: No   . Acetaminophen Nausea Only and Other (See Comments)    Irritates stomach ulcer  Abdominal pain  . Tolmetin Nausea Only    Other reaction(s): Other (See Comments) ULCER  . Aspirin Other (See Comments)    Irritates stomach ulcer   . Food Hives and Swelling    Carrots, ketchup   . Nsaids Other (See Comments)    ULCER  . Tramadol Nausea And Vomiting    Family History  Problem Relation Age of Onset  . Diabetes Mother   . Diabetes Father   . Heart disease Father   . Diabetes Sister   . Congestive Heart Failure Sister 57  . Diabetes Brother     Prior to Admission medications   Medication Sig Start Date End Date Taking? Authorizing Provider  allopurinol (ZYLOPRIM) 100 MG tablet Take 1 tablet (100 mg total) by mouth daily. 09/23/15   Debbe Odea, MD  amLODipine (NORVASC) 10 MG tablet Take 1 tablet (10 mg total) by mouth daily. 12/13/18   [name], DO  aspirin (ECOTRIN LOW STRENGTH) 81 MG EC tablet Take 1 tablet (81 mg total) by mouth daily. Swallow whole. Patient taking differently: Take 81 mg by mouth as needed. Swallow whole.  12/13/18   [name], DO  atorvastatin (LIPITOR) 10 MG tablet Take 1 tablet (10 mg total) by mouth daily. 12/13/18   [name], DO  azelastine (ASTELIN) 0.1 % nasal spray Place 2 sprays into both nostrils 2 (two) times daily. Use in each nostril as directed 01/23/19   [name], DO  blood glucose meter kit and supplies KIT Dispense based on patient and insurance preference. Use up to four times daily as directed. (FOR ICD-9 250.00, 250.01). 12/13/18   [name], DO  cetirizine (ZYRTEC) 10 MG tablet Take 1 tablet (10 mg total) by mouth daily. 12/13/18   [name], DO  DULoxetine (CYMBALTA) 30 MG capsule Take 1 capsule (30 mg total) by mouth 2 (two) times daily. 01/23/19   Lockamy, Christia Reading, DO  fluticasone (FLONASE) 50 MCG/ACT nasal  spray Place 2 sprays into both nostrils daily as needed for allergies or rhinitis. 12/19/18   Rai, Vernelle Emerald, MD  furosemide (LASIX) 20 MG tablet Take 1 tablet (20 mg total) by mouth daily. Hold for 2 days Patient taking differently: Take 20 mg by mouth daily.  12/21/18   Rai, Vernelle Emerald, MD  hydrOXYzine (ATARAX/VISTARIL) 10 MG tablet Take 1 tablet (10 mg total) by mouth 3 (three) times daily as needed. Patient taking differently: Take 10 mg by mouth 3 (three) times daily as needed for itching.  12/13/18   Lockamy, Christia Reading, DO  insulin aspart (NOVOLOG) 100 UNIT/ML injection Inject 30 Units into the skin 4 (four) times daily -  with meals and at bedtime. 01/23/19   [name], DO  Insulin Glargine, 2 Unit Dial, (TOUJEO MAX SOLOSTAR) 300 UNIT/ML SOPN Inject 130 Units into the skin every morning. And pen needles 1/day 12/26/18   Renato Shin, MD  LANTUS SOLOSTAR 100 UNIT/ML Solostar Pen Inject 45 Units into the skin at bedtime. 01/18/19   [provider]  Melatonin 3 MG TABS Take 2 tablets (6 mg total) by mouth at bedtime. 12/13/18   [name], DO  methocarbamol (ROBAXIN) 500 MG tablet Take 500 mg by mouth 2 (two) times a day.    [provider]  metoCLOPramide (REGLAN) 10 MG tablet Take 1 tablet (10 mg total) by mouth 3 (three) times daily before meals. 12/19/18   Rai, Vernelle Emerald, MD  metoprolol succinate (TOPROL-XL) 25 MG 24 hr tablet Take 1 tablet (25 mg total) by mouth daily. 12/13/18   [name], DO  Multiple Vitamin (MULTIVITAMIN WITH MINERALS) TABS tablet Take 1 tablet by mouth daily.    [provider]  nitroGLYCERIN (NITROSTAT) 0.4 MG SL tablet Place 1 tablet (0.4 mg total) under the tongue every 5 (five) minutes as needed for chest pain. 12/13/18   Lockamy, Timothy, DO  ondansetron (ZOFRAN ODT) 4 MG disintegrating tablet Take 1 tablet (4 mg total) by mouth every 8 (eight) hours as needed for nausea or vomiting. 12/19/18   Rai, Ripudeep K, MD   pantoprazole (PROTONIX) 40 MG tablet Take 1 tablet (40 mg total) by mouth 2 (two) times daily. 12/13/18   [name], DO    Physical Exam:  Constitutional: NAD, calm, comfortable Vitals:   01/26/19 1012 01/26/19 1016 01/26/19 1147  BP:  107/67 (!) 105/54  Pulse:  (!) 108 (!) 101  Resp:  16 14  Temp:  (!) 97.3 F (36.3 C) 97.8 F (36.6 C)  TempSrc:  Oral Oral  SpO2: (!) 78% 94% 95%   Eyes: PERRL, lids and conjunctivae normal ENMT: Mucous membranes are moist. Posterior pharynx clear of any exudate or lesions.Normal dentition.  Neck: normal, supple, no masses, no thyromegaly Respiratory: clear to auscultation bilaterally, no wheezing, no crackles. Normal respiratory effort. No accessory muscle use.  Cardiovascular: Regular rate and rhythm, no murmurs / rubs / gallops. No extremity edema. 2+ pedal pulses. No carotid bruits.  Abdomen: Diffuse tenderness greater over lower abdomen, no masses palpated. No hepatosplenomegaly. Bowel sounds positive.  Musculoskeletal: no clubbing / cyanosis. No joint deformity upper and lower extremities. Good ROM, no contractures. Normal muscle tone.  Skin: no rashes, lesions, ulcers. No induration Neurologic: CN 2-12 grossly intact. Sensation intact, DTR normal. Strength 5/5 in all 4.  Psychiatric: Normal judgment and insight. Alert and oriented x 3. Normal mood.    Labs on Admission: I have personally reviewed following labs and imaging studies  CBC: Recent Labs  Lab 01/26/19 1021  WBC 13.2*  HGB 10.1*  HCT 32.0*  MCV 88.4  PLT 771   Basic Metabolic Panel: Recent Labs  Lab 01/26/19 1021  NA 134*  K 2.9*  CL 96*  CO2 25  GLUCOSE 369*  BUN 14  CREATININE 1.55*  CALCIUM 8.4*   Urine analysis:    Component Value Date/Time   COLORURINE YELLOW 12/17/2018 1326   APPEARANCEUR HAZY (A) 12/17/2018 1326   LABSPEC 1.022 12/17/2018 1326   PHURINE 5.0 12/17/2018 1326   GLUCOSEU >=500 (A) 12/17/2018 1326   HGBUR MODERATE (A) 12/17/2018  [place] 12/17/2018 [place] 12/17/2018 1326   PROTEINUR >=300 (A) 12/17/2018 1326   UROBILINOGEN 0.2 10/02/2013 2108  NITRITE NEGATIVE 12/17/2018 [name] 12/17/2018 1326    Radiological Exams on Admission: Dg Chest 2 View  Result Date: 01/26/2019 CLINICAL DATA:  Chest pain. EXAM: CHEST - 2 VIEW COMPARISON:  12/17/2018 and 07/19/2018 FINDINGS: The heart size and mediastinal contours are within normal limits. Both lungs are clear. The visualized skeletal structures are unremarkable. IMPRESSION: No active cardiopulmonary disease. Electronically Signed   By: Lorriane Shire M.D.   On: 01/26/2019 11:18    EKG: Independently reviewed by me shows a sinus tachycardia with premature atrial complexes left axis deviation and minimal voltage criteria for LVH which is unchanged from prior EKG.  Assessment/Plan Principal Problem:   Chest pain Active Problems:   Essential hypertension, benign   Uncontrolled type 2 diabetes mellitus with diabetic neuropathy, with long-term current use of insulin (HCC)   GERD (gastroesophageal reflux disease)   Obesity   Cardiac arrest ([place])   1.  Chest pain: Differential diagnosis in this patient with a myriad of concerns and complaints includes pulmonary embolism, cardiac chest pain, and possible GI related pain related to biliary issues.  Will check LFTs if LFTs are elevated will consider ultrasound of the right upper quadrant.  If D-dimer is elevated will consider VQ scan.  Please note patient had a cardiac arrest after IV dye in 2019.  Is not a candidate for CTA of the chest.  We will also place her in overnight observation rule her out for myocardial infarction with serial enzymes and EKGs.  I have sent a note to Birdie Sons from cardiology to see the patient in the morning and consider whether or not she is a candidate for stress testing.  Please note patient reports that she has an appointment with the  cardiologist in December.  Essential hypertension: Blood pressures currently are trolled we will continue home blood pressure medication.  3.  Uncontrolled type 2 diabetes mellitus with diabetic neuropathy and long-term current use of insulin: We will start insulin sliding scale therapy.  Will order home medications once they have been reconciled.  We will hold metformin if she is taking it.  4.  Gastroesophageal reflux disease: Continue home medication management once meds reconciled.  5.  Obesity: Certainly weight loss would help her with her reflux as well as with control of her blood glucoses.  This is encouraged.  6.  History of cardiac arrest associated with IV dye: Noted no IV dye for this patient.  DVT prophylaxis: Lovenox Code Status: Full code Family Communication: No family present at the time of admission patient did not request communication Disposition Plan: Likely home in a.m. after stress testing if normal Consults called: Cardiology to see patient in a.m. Admission status: It is my clinical opinion that referral for OBSERVATION is reasonable and necessary in this patient based on the above information provided. The aforementioned taken together are felt to place the patient at high risk for further clinical deterioration. However it is anticipated that the patient may be medically stable for discharge from the hospital within 24 to 48 hours.    Lady Deutscher MD FACP [facility] Pager [phone]  How to contact the [facility] Attending or Consulting provider [place] or covering provider during after hours [place], for this patient?  1. Check the care team in [facility] and look for a) attending/consulting [facility] provider listed and b) the [facility] team listed 2. Log into [email or web address] and use [place]'s universal password to access. If you do not have  the password, please contact the hospital operator. 3. Locate the [facility] provider you are looking for under [facility] and  page to a number that you can be directly reached. 4. If you still have difficulty reaching the provider, please page the [facility] (Director on Call) for the Hospitalists listed on amion for assistance.  If 7PM-7AM, please contact night-coverage [email or web address] Password [email or web address]  01/26/2019, 1:29 PM

## 2019-01-26 NOTE — ED Provider Notes (Signed)
[facility] DEPARTMENT Provider Note   [name]: [id number] Arrival date & time: 01/26/19  1008     History   Chief Complaint Chief Complaint  Patient presents with   Chest Pain    HPI Deborah [name] is a 53 y.o. female with past medical history significant for chronic pain, CHF, type 2 diabetes insulin-dependent, fibromyalgia, gastroparesis, hypertension, CVA presents to emergency department today with chief complaint of chest pain.  Onset was acute starting at 2 AM this morning.  She states the pain woke her up from her sleep.  Pain is located in the center of her chest and radiates to her right shoulder.  She states pain has been constant.  She describes as a throbbing sensation.  She states pain is worse with exertion.  She rates pain 7 out of 10 in severity.  She is also reporting associated nausea and emesis.  She estimates over 5 episodes of nonbloody nonbilious emesis.  She also endorses generalized abdominal pain.  She states her stomach feels sore after multiple episodes of vomiting. She was given nitro by EMS.  She states minimal symptom improvement with nitro. Patient is allergic to aspirin.  Cardiac risk factors include hypertension, hyperlipidemia, diabetes.  Patient denies family history of cardiac disease. Denies alcohol and drug use. Also denies fever, chills, cough, shortness of breath, leg swelling, diarrhea, diaphoresis, dyspnea on exertion.  Chart review shows she had an echo 11/30/2017.  LVEF 60 to 65%. Abdominal surgical history includes cholecystectomy.   Past Medical History:  Diagnosis Date   Acute back pain with sciatica, left    Acute back pain with sciatica, right    Anemia, unspecified    Chest pain 12/2015   Chronic pain    Diabetes mellitus    Esophageal reflux    Fibromyalgia    Gastric ulcer    Gastroparesis    Gout    Hyperlipidemia    Hypertension    Lumbosacral stenosis    Obesity    Pneumonia      [name]    Stroke ([place]) 02/2011   Vitamin B12 deficiency anemia     Patient Active Problem List   Diagnosis Date Noted   Congestion of nasal sinus 01/24/2019   Chronic diastolic heart failure ([place]) 12/19/2018   Intractable nausea and vomiting 12/17/2018   Hypoxia 12/17/2018   Hyperglycemia 12/17/2018   Elevated troponin I level    Vomiting 07/18/2018   Abdominal pain 07/17/2018   Hyperkalemia 07/17/2018   Cardiac arrest ([place]) 11/29/2017   Pelvic mass 11/29/2017   Leukocytosis 11/29/2017   Anxiety 11/29/2017   Allergic reaction to contrast media, initial encounter 11/28/2017   Palliative care encounter    Back pain 03/19/2017   Stroke (cerebrum) ([place]) 03/19/2017   GERD (gastroesophageal reflux disease) 03/19/2017   Depression 03/19/2017   Obesity    Urinary tract infection 08/16/2016   Normocytic normochromic anemia 08/16/2016   Gastroparesis 08/16/2016   Intractable vomiting with nausea 06/17/2016   Diabetic gastroparesis ([place]) 06/05/2016   Gout 06/05/2016   AKI (acute kidney injury) ([place]) 12/06/2015   Chest pain 09/26/2015   Hypokalemia 09/26/2015   Hypomagnesemia 09/26/2015   Nausea and vomiting 08/20/2015   Gout flare 05/27/2015   Lower abdominal pain 05/26/2015   DKA (diabetic ketoacidoses) ([place]) 05/25/2015   Uncontrolled type 2 diabetes mellitus with diabetic neuropathy, with long-term current use of insulin ([place]) 05/25/2015   Type 2 diabetes mellitus with hyperglycemia, with long-term current use of  insulin ([place]) 05/25/2015   CKD (chronic kidney disease), stage II 05/25/2015   Essential hypertension, benign 09/28/2013    Past Surgical History:  Procedure Laterality Date   CATARACT EXTRACTION  01/2014   CHOLECYSTECTOMY       OB History   No obstetric history on file.      Home Medications    Prior to Admission medications   Medication Sig Start Date End Date Taking? Authorizing Provider   allopurinol (ZYLOPRIM) 100 MG tablet Take 1 tablet (100 mg total) by mouth daily. 09/23/15   Debbe Odea, MD  amLODipine (NORVASC) 10 MG tablet Take 1 tablet (10 mg total) by mouth daily. 12/13/18   [name], DO  aspirin (ECOTRIN LOW STRENGTH) 81 MG EC tablet Take 1 tablet (81 mg total) by mouth daily. Swallow whole. Patient taking differently: Take 81 mg by mouth as needed. Swallow whole.  12/13/18   [name], DO  atorvastatin (LIPITOR) 10 MG tablet Take 1 tablet (10 mg total) by mouth daily. 12/13/18   [name], DO  azelastine (ASTELIN) 0.1 % nasal spray Place 2 sprays into both nostrils 2 (two) times daily. Use in each nostril as directed 01/23/19   [name], DO  blood glucose meter kit and supplies KIT Dispense based on patient and insurance preference. Use up to four times daily as directed. (FOR ICD-9 250.00, 250.01). 12/13/18   [name], DO  cetirizine (ZYRTEC) 10 MG tablet Take 1 tablet (10 mg total) by mouth daily. 12/13/18   [name], DO  DULoxetine (CYMBALTA) 30 MG capsule Take 1 capsule (30 mg total) by mouth 2 (two) times daily. 01/23/19   Lockamy, Christia Reading, DO  fluticasone (FLONASE) 50 MCG/ACT nasal spray Place 2 sprays into both nostrils daily as needed for allergies or rhinitis. 12/19/18   Rai, Vernelle Emerald, MD  furosemide (LASIX) 20 MG tablet Take 1 tablet (20 mg total) by mouth daily. Hold for 2 days Patient taking differently: Take 20 mg by mouth daily.  12/21/18   Rai, Vernelle Emerald, MD  hydrOXYzine (ATARAX/VISTARIL) 10 MG tablet Take 1 tablet (10 mg total) by mouth 3 (three) times daily as needed. Patient taking differently: Take 10 mg by mouth 3 (three) times daily as needed for itching.  12/13/18   Lockamy, Christia Reading, DO  insulin aspart (NOVOLOG) 100 UNIT/ML injection Inject 30 Units into the skin 4 (four) times daily -  with meals and at bedtime. 01/23/19   [name], DO  Insulin Glargine, 2 Unit Dial, (TOUJEO MAX SOLOSTAR) 300  UNIT/ML SOPN Inject 130 Units into the skin every morning. And pen needles 1/day 12/26/18   Renato Shin, MD  Melatonin 3 MG TABS Take 2 tablets (6 mg total) by mouth at bedtime. 12/13/18   [name], DO  methocarbamol (ROBAXIN) 500 MG tablet Take 500 mg by mouth 2 (two) times a day.    [provider]  metoCLOPramide (REGLAN) 10 MG tablet Take 1 tablet (10 mg total) by mouth 3 (three) times daily before meals. 12/19/18   Rai, Vernelle Emerald, MD  metoprolol succinate (TOPROL-XL) 25 MG 24 hr tablet Take 1 tablet (25 mg total) by mouth daily. 12/13/18   [name], DO  Multiple Vitamin (MULTIVITAMIN WITH MINERALS) TABS tablet Take 1 tablet by mouth daily.    [provider]  nitroGLYCERIN (NITROSTAT) 0.4 MG SL tablet Place 1 tablet (0.4 mg total) under the tongue every 5 (five) minutes as needed for chest pain. 12/13/18   [name], DO  ondansetron (ZOFRAN ODT) 4 MG disintegrating tablet Take 1 tablet (4 mg total) by mouth every 8 (eight) hours as needed for nausea or vomiting. 12/19/18   Rai, Ripudeep K, MD  pantoprazole (PROTONIX) 40 MG tablet Take 1 tablet (40 mg total) by mouth 2 (two) times daily. 12/13/18   [name], DO    Family History Family History  Problem Relation Age of Onset   Diabetes Mother    Diabetes Father    Heart disease Father    Diabetes Sister    Congestive Heart Failure Sister 65   Diabetes Brother     Social History Social History   Tobacco Use   Smoking status: Never Smoker   Smokeless tobacco: Never Used  Substance Use Topics   Alcohol use: No   Drug use: No     Allergies   Contrast media [iodinated diagnostic agents], Diazepam, Isovue [iopamidol], Lisinopril, Penicillins, Acetaminophen, Tolmetin, Aspirin, Food, Nsaids, and Tramadol   Review of Systems Review of Systems  Constitutional: Negative for chills and fever.  HENT: Negative for congestion, ear discharge, ear pain, sinus pressure, sinus pain  and sore throat.   Eyes: Negative for pain and redness.  Respiratory: Negative for cough and shortness of breath.   Cardiovascular: Positive for chest pain. Negative for leg swelling.  Gastrointestinal: Positive for abdominal pain, nausea and vomiting. Negative for constipation and diarrhea.  Genitourinary: Negative for dysuria and hematuria.  Musculoskeletal: Negative for back pain and neck pain.  Skin: Negative for wound.  Neurological: Negative for weakness, numbness and headaches.     Physical Exam Updated Vital Signs BP 107/67    Pulse (!) 108    Temp (!) 97.3 F (36.3 C) (Oral)    Resp 16    LMP 10/10/2012    SpO2 94%   Physical Exam Vitals signs and nursing note reviewed.  Constitutional:      General: She is not in acute distress.    Appearance: She is obese. She is not toxic-appearing or diaphoretic.     Comments: Chronically ill-appearing  HENT:     Head: Normocephalic and atraumatic.     Right Ear: Tympanic membrane and external ear normal.     Left Ear: Tympanic membrane and external ear normal.     Nose: Nose normal.     Mouth/Throat:     Mouth: Mucous membranes are moist.     Pharynx: Oropharynx is clear.  Eyes:     General: No scleral icterus.       Right eye: No discharge.        Left eye: No discharge.     Extraocular Movements: Extraocular movements intact.     Conjunctiva/sclera: Conjunctivae normal.     Pupils: Pupils are equal, round, and reactive to light.  Neck:     Musculoskeletal: Normal range of motion.     Vascular: No JVD.  Cardiovascular:     Rate and Rhythm: Regular rhythm. Tachycardia present.     Pulses: Normal pulses.          Radial pulses are 2+ on the right side and 2+ on the left side.     Heart sounds: Normal heart sounds.     Comments: Tachycardic to 101 on my exam Pulmonary:     Comments: Lungs clear to auscultation in all fields. Symmetric chest rise. No wheezing, rales, or rhonchi. Chest:       Comments: Tenderness to  palpation as depicted in image above Abdominal:     Tenderness:  There is no right CVA tenderness or left CVA tenderness.     Comments: Abdomen is soft, non-distended. + Generalized abdominal tenderness. No rigidity, no guarding. No peritoneal signs.  Musculoskeletal: Normal range of motion.     Right lower leg: 1+ Pitting Edema present.     Left lower leg: 1+ Pitting Edema present.  Skin:    General: Skin is warm and dry.     Capillary Refill: Capillary refill takes less than 2 seconds.  Neurological:     Mental Status: She is oriented to person, place, and time.     GCS: GCS eye subscore is 4. GCS verbal subscore is 5. GCS motor subscore is 6.     Comments: Fluent speech, no facial droop.  Psychiatric:        Behavior: Behavior normal.      ED Treatments / Results  Labs (all labs ordered are listed, but only abnormal results are displayed) Labs Reviewed  BASIC METABOLIC PANEL - Abnormal; Notable for the following components:      Result Value   Sodium 134 (*)    Potassium 2.9 (*)    Chloride 96 (*)    Glucose, Bld 369 (*)    Creatinine, Ser 1.55 (*)    Calcium 8.4 (*)    GFR calc non Af Amer 38 (*)    GFR calc Af Amer 44 (*)    All other components within normal limits  CBC - Abnormal; Notable for the following components:   WBC 13.2 (*)    RBC 3.62 (*)    Hemoglobin 10.1 (*)    HCT 32.0 (*)    All other components within normal limits  TROPONIN I (HIGH SENSITIVITY) - Abnormal; Notable for the following components:   Troponin I (High Sensitivity) 55 (*)    All other components within normal limits  I-STAT BETA HCG BLOOD, ED ([facility], [facility], [facility] ONLY)    EKG EKG Interpretation  Date/Time:  Friday January 26 2019 10:20:49 EST Ventricular Rate:  107 PR Interval:  156 QRS Duration: 80 QT Interval:  360 QTC Calculation: 480 R Axis:   -43 Text Interpretation: Sinus tachycardia with Premature atrial complexes Left axis deviation Minimal voltage criteria for LVH, may be  normal variant ( R in aVL ) Cannot rule out Anteroseptal infarct , age undetermined Abnormal ECG No significant change since last tracing Confirmed by Deno Etienne [id number]) on 01/26/2019 11:17:12 AM   Radiology Dg Chest 2 View  Result Date: 01/26/2019 CLINICAL DATA:  Chest pain. EXAM: CHEST - 2 VIEW COMPARISON:  12/17/2018 and 07/19/2018 FINDINGS: The heart size and mediastinal contours are within normal limits. Both lungs are clear. The visualized skeletal structures are unremarkable. IMPRESSION: No active cardiopulmonary disease. Electronically Signed   By: Lorriane Shire M.D.   On: 01/26/2019 11:18    Procedures Procedures (including critical care time)  Medications Ordered in ED Medications  sodium chloride flush (NS) 0.9 % injection 3 mL (has no administration in time range)  ondansetron (ZOFRAN) injection 4 mg (has no administration in time range)  alum & mag hydroxide-simeth (MAALOX/MYLANTA) 200-200-20 MG/5ML suspension 30 mL (has no administration in time range)    And  lidocaine (XYLOCAINE) 2 % viscous mouth solution 15 mL (has no administration in time range)  zolpidem (AMBIEN) tablet 5 mg (has no administration in time range)  metoCLOPramide (REGLAN) injection 10 mg (10 mg Intravenous Given 01/26/19 1214)  potassium chloride 10 mEq in 100 mL IVPB (10 mEq Intravenous New Bag/Given  01/26/19 1214)  sodium chloride 0.9 % bolus 500 mL (500 mLs Intravenous New Bag/Given 01/26/19 1213)     Initial Impression / Assessment and Plan / ED Course  I have reviewed the triage vital signs and the nursing notes.  Pertinent labs & imaging results that were available during my care of the patient were reviewed by me and considered in my medical decision making (see chart for details).  Patient seen and examined.  On arrival she was noted to be tachycardic to 108, normotensive.  She has documented O2 sat of 78% in triage.  I feel this is entered in error, and 4 minutes later she had a documented  O2 sat of 94% on room air.  During my exam O2 is 95% on room air.  Lungs are clear to auscultation all fields.  She has generalized abdominal tenderness, no peritoneal signs.  Chest pain is reproducible on exam.  Patient had lab work ordered in triage followed by the results which show leukocytosis of 13.2, hemoglobin of 10.1.  Patient's baseline hemoglobin appears to be ranging from 9-10.  BMP also notable for hypokalemia of 2.9-will replete with IV potassium, hyperglycemia of 369, creatinine 1.55.  Normal anion gap.  First troponin is 55.  Looking through patient's chart 1 month ago troponins ranged from 25-47. Small fluid bolus given. Chest x-ray viewed by me without infiltrate, effusion, pneumothorax, fracture or dislocation.  Patient has heart score of 6.  EKG is unchanged from prior. Discussed with ED attending Dr. Tyrone Nine who agrees patient will need admission for chest pain rule out.  Looking through patient's chart in September 2019 she had a CT scan and after being given IV contrast had cardiac arrest, needing CPR.  Unsure if this was a contrast dye allergy.  Given this questionable allergy will discuss with hospitalist and regards of VQ scan given her tachycardia and chest pain. LFTs and d-dimer pending.   Spoke with Dr. Evangeline Gula with hospitalist service who agrees to assume care of patient and bring into the hospital for further evaluation and management. Dr. Evangeline Gula will follow up on pending labs and determine if further imaging is necessary, appreciate help.    Portions of this note were generated with [employer]. Dictation errors may occur despite best attempts at proofreading.  Final Clinical Impressions(s) / ED Diagnoses   Final diagnoses:  Chest pain, unspecified type    ED Discharge Orders    None       Cherre Robins, PA-C 01/26/19 [place], [place], DO 01/26/19 1357

## 2019-01-26 NOTE — ED Triage Notes (Signed)
Pt here for substernal chest pain onset 0200 this morning with radiation to R arm, worse with movement. Endorsed n/v. 1 nitro given by EMS, allergic to ASA.

## 2019-01-26 NOTE — Progress Notes (Signed)
Message from Cardiology: Pt with neg cath last year.  No need for cardiology to see.

## 2019-01-27 ENCOUNTER — Observation Stay (HOSPITAL_COMMUNITY): Payer: Medicare Other

## 2019-01-27 DIAGNOSIS — K3184 Gastroparesis: Secondary | ICD-10-CM | POA: Diagnosis present

## 2019-01-27 DIAGNOSIS — Z8249 Family history of ischemic heart disease and other diseases of the circulatory system: Secondary | ICD-10-CM | POA: Diagnosis not present

## 2019-01-27 DIAGNOSIS — I249 Acute ischemic heart disease, unspecified: Secondary | ICD-10-CM

## 2019-01-27 DIAGNOSIS — M797 Fibromyalgia: Secondary | ICD-10-CM | POA: Diagnosis present

## 2019-01-27 DIAGNOSIS — K219 Gastro-esophageal reflux disease without esophagitis: Secondary | ICD-10-CM | POA: Diagnosis present

## 2019-01-27 DIAGNOSIS — R079 Chest pain, unspecified: Secondary | ICD-10-CM

## 2019-01-27 DIAGNOSIS — Z20828 Contact with and (suspected) exposure to other viral communicable diseases: Secondary | ICD-10-CM | POA: Diagnosis not present

## 2019-01-27 DIAGNOSIS — M109 Gout, unspecified: Secondary | ICD-10-CM | POA: Diagnosis present

## 2019-01-27 DIAGNOSIS — N1832 Chronic kidney disease, stage 3b: Secondary | ICD-10-CM | POA: Diagnosis not present

## 2019-01-27 DIAGNOSIS — R197 Diarrhea, unspecified: Secondary | ICD-10-CM | POA: Diagnosis present

## 2019-01-27 DIAGNOSIS — Z7982 Long term (current) use of aspirin: Secondary | ICD-10-CM | POA: Diagnosis not present

## 2019-01-27 DIAGNOSIS — E1143 Type 2 diabetes mellitus with diabetic autonomic (poly)neuropathy: Secondary | ICD-10-CM | POA: Diagnosis not present

## 2019-01-27 DIAGNOSIS — E669 Obesity, unspecified: Secondary | ICD-10-CM | POA: Diagnosis not present

## 2019-01-27 DIAGNOSIS — F419 Anxiety disorder, unspecified: Secondary | ICD-10-CM | POA: Diagnosis present

## 2019-01-27 DIAGNOSIS — Z8674 Personal history of sudden cardiac arrest: Secondary | ICD-10-CM | POA: Diagnosis not present

## 2019-01-27 DIAGNOSIS — Z8673 Personal history of transient ischemic attack (TIA), and cerebral infarction without residual deficits: Secondary | ICD-10-CM | POA: Diagnosis not present

## 2019-01-27 DIAGNOSIS — E876 Hypokalemia: Secondary | ICD-10-CM | POA: Diagnosis not present

## 2019-01-27 DIAGNOSIS — I1310 Hypertensive heart and chronic kidney disease without heart failure, with stage 1 through stage 4 chronic kidney disease, or unspecified chronic kidney disease: Secondary | ICD-10-CM | POA: Diagnosis not present

## 2019-01-27 DIAGNOSIS — M940 Chondrocostal junction syndrome [Tietze]: Secondary | ICD-10-CM | POA: Diagnosis not present

## 2019-01-27 DIAGNOSIS — Z87892 Personal history of anaphylaxis: Secondary | ICD-10-CM | POA: Diagnosis not present

## 2019-01-27 DIAGNOSIS — E1165 Type 2 diabetes mellitus with hyperglycemia: Secondary | ICD-10-CM | POA: Diagnosis not present

## 2019-01-27 DIAGNOSIS — E785 Hyperlipidemia, unspecified: Secondary | ICD-10-CM | POA: Diagnosis not present

## 2019-01-27 DIAGNOSIS — Z794 Long term (current) use of insulin: Secondary | ICD-10-CM | POA: Diagnosis not present

## 2019-01-27 DIAGNOSIS — Z833 Family history of diabetes mellitus: Secondary | ICD-10-CM | POA: Diagnosis not present

## 2019-01-27 DIAGNOSIS — E1122 Type 2 diabetes mellitus with diabetic chronic kidney disease: Secondary | ICD-10-CM | POA: Diagnosis not present

## 2019-01-27 LAB — C DIFFICILE QUICK SCREEN W PCR REFLEX
C Diff antigen: NEGATIVE
C Diff interpretation: NOT DETECTED
C Diff toxin: NEGATIVE

## 2019-01-27 LAB — BASIC METABOLIC PANEL
Anion gap: 10 (ref 5–15)
Anion gap: 10 (ref 5–15)
BUN: 19 mg/dL (ref 6–20)
BUN: 23 mg/dL — ABNORMAL HIGH (ref 6–20)
CO2: 26 mmol/L (ref 22–32)
CO2: 20 mmol/L — ABNORMAL LOW (ref 22–32)
Calcium: 8 mg/dL — ABNORMAL LOW (ref 8.9–10.3)
Calcium: 7.9 mg/dL — ABNORMAL LOW (ref 8.9–10.3)
Chloride: 101 mmol/L (ref 98–111)
Chloride: 104 mmol/L (ref 98–111)
Creatinine, Ser: 1.68 mg/dL — ABNORMAL HIGH (ref 0.44–1.00)
Creatinine, Ser: 1.83 mg/dL — ABNORMAL HIGH (ref 0.44–1.00)
GFR calc Af Amer: 40 mL/min — ABNORMAL LOW (ref >60)
GFR calc Af Amer: 36 mL/min — ABNORMAL LOW (ref >60)
GFR calc non Af Amer: 34 mL/min — ABNORMAL LOW (ref >60)
GFR calc non Af Amer: 31 mL/min — ABNORMAL LOW (ref >60)
Glucose, Bld: 157 mg/dL — ABNORMAL HIGH (ref 70–99)
Glucose, Bld: 194 mg/dL — ABNORMAL HIGH (ref 70–99)
Potassium: 2.6 mmol/L — CL (ref 3.5–5.1)
Potassium: 3.1 mmol/L — ABNORMAL LOW (ref 3.5–5.1)
Sodium: 137 mmol/L (ref 135–145)
Sodium: 134 mmol/L — ABNORMAL LOW (ref 135–145)

## 2019-01-27 LAB — CBC
HCT: 28.8 % — ABNORMAL LOW (ref 36.0–46.0)
Hemoglobin: 9.3 g/dL — ABNORMAL LOW (ref 12.0–15.0)
MCH: 28.4 pg (ref 26.0–34.0)
MCHC: 32.3 g/dL (ref 30.0–36.0)
MCV: 88.1 fL (ref 80.0–100.0)
Platelets: 312 10*3/uL (ref 150–400)
RBC: 3.27 MIL/uL — ABNORMAL LOW (ref 3.87–5.11)
RDW: 14.1 % (ref 11.5–15.5)
WBC: 12.2 10*3/uL — ABNORMAL HIGH (ref 4.0–10.5)
nRBC: 0 % (ref 0.0–0.2)

## 2019-01-27 LAB — TROPONIN I (HIGH SENSITIVITY): Troponin I (High Sensitivity): 47 ng/L — ABNORMAL HIGH (ref <18)

## 2019-01-27 LAB — GLUCOSE, CAPILLARY
Glucose-Capillary: 169 mg/dL — ABNORMAL HIGH (ref 70–99)
Glucose-Capillary: 168 mg/dL — ABNORMAL HIGH (ref 70–99)
Glucose-Capillary: 228 mg/dL — ABNORMAL HIGH (ref 70–99)
Glucose-Capillary: 149 mg/dL — ABNORMAL HIGH (ref 70–99)
Glucose-Capillary: 154 mg/dL — ABNORMAL HIGH (ref 70–99)

## 2019-01-27 LAB — MAGNESIUM
Magnesium: 1.3 mg/dL — ABNORMAL LOW (ref 1.7–2.4)
Magnesium: 2.2 mg/dL (ref 1.7–2.4)

## 2019-01-27 MED ORDER — POTASSIUM CHLORIDE CRYS ER 20 MEQ PO TBCR
40.0000 meq | EXTENDED_RELEASE_TABLET | Freq: Two times a day (BID) | ORAL | Status: DC
  Administered 2019-01-27 – 2019-01-29 (×5): 40 meq via ORAL
  Filled 2019-01-27 (×4): qty 2

## 2019-01-27 MED ORDER — MAGNESIUM SULFATE 4 GM/100ML IV SOLN
4.0000 g | Freq: Once | INTRAVENOUS | Status: AC
  Administered 2019-01-27: 4 g via INTRAVENOUS
  Filled 2019-01-27: qty 100

## 2019-01-27 MED ORDER — LOPERAMIDE HCL 2 MG PO CAPS
4.0000 mg | ORAL_CAPSULE | ORAL | Status: DC | PRN
  Administered 2019-01-27: 4 mg via ORAL
  Filled 2019-01-27: qty 2

## 2019-01-27 MED ORDER — SODIUM CHLORIDE 0.9 % IV SOLN: 4.0000 g | Freq: Once | INTRAVENOUS | Status: DC

## 2019-01-27 MED ORDER — REGADENOSON 0.4 MG/5ML IV SOLN
0.4000 mg | Freq: Once | INTRAVENOUS | Status: AC
  Administered 2019-01-27: 09:00:00 0.4 mg via INTRAVENOUS
  Filled 2019-01-27: qty 5

## 2019-01-27 MED ORDER — LOPERAMIDE HCL 2 MG PO CAPS
2.0000 mg | ORAL_CAPSULE | Freq: Once | ORAL | Status: AC
  Administered 2019-01-27: 2 mg via ORAL
  Filled 2019-01-27: qty 1

## 2019-01-27 MED ORDER — TECHNETIUM TC 99M TETROFOSMIN IV KIT
30.0000 | PACK | Freq: Once | INTRAVENOUS | Status: AC | PRN
  Administered 2019-01-27: 30 via INTRAVENOUS

## 2019-01-27 MED ORDER — METHOCARBAMOL 500 MG PO TABS: 500.0000 mg | ORAL_TABLET | Freq: Two times a day (BID) | ORAL | Status: DC | PRN

## 2019-01-27 MED ORDER — POTASSIUM CHLORIDE 10 MEQ/100ML IV SOLN
10.0000 meq | INTRAVENOUS | Status: AC
  Administered 2019-01-27 (×4): 10 meq via INTRAVENOUS
  Filled 2019-01-27 (×4): qty 100

## 2019-01-27 MED ORDER — REGADENOSON 0.4 MG/5ML IV SOLN
INTRAVENOUS | Status: AC
  Administered 2019-01-27: 0.4 mg via INTRAVENOUS
  Filled 2019-01-27: qty 5

## 2019-01-27 MED ORDER — METOCLOPRAMIDE HCL 10 MG PO TABS
10.0000 mg | ORAL_TABLET | Freq: Three times a day (TID) | ORAL | Status: DC
  Administered 2019-01-27 – 2019-01-29 (×5): 10 mg via ORAL
  Filled 2019-01-27 (×6): qty 1

## 2019-01-27 MED ORDER — POTASSIUM CHLORIDE CRYS ER 20 MEQ PO TBCR
40.0000 meq | EXTENDED_RELEASE_TABLET | Freq: Two times a day (BID) | ORAL | Status: DC
  Filled 2019-01-27: qty 2

## 2019-01-27 MED ORDER — ENOXAPARIN SODIUM 40 MG/0.4ML ~~LOC~~ SOLN
40.0000 mg | SUBCUTANEOUS | Status: DC
  Administered 2019-01-27 – 2019-01-28 (×2): 40 mg via SUBCUTANEOUS
  Filled 2019-01-27 (×2): qty 0.4

## 2019-01-27 NOTE — Progress Notes (Signed)
Pt refused to take potassium pill , stated will take later, discussed with pt verbalized understaning but want to take later, give report to trixie rn.day shift

## 2019-01-27 NOTE — Plan of Care (Signed)

## 2019-01-27 NOTE — Progress Notes (Signed)
Potassium from lab this am 2.6, notify provider, follow up new order

## 2019-01-27 NOTE — Progress Notes (Addendum)
Family Medicine Transition of care progress note  Patient initially admitted to [facility] hospitalists. Family Medicine teaching service to pick up on 11/21, although I do not see this conversation documented anywhere. Further the attending was erroneously switched to Dr. Dareen Piano of IM. Have switched to appropriate attending, Dr. Ardelia Mems of [facility].  The patient is to undergo cardiac stress testing, the rationale is presumably due to the elevated troponins and decreased EF although there is a note from cardiology that they do not need to see at this admission and no follow up notes. If abnormal will plan on consulting cardiology formally. If negative will plan to likely dc home with follow up in December.  Mg and k quite low. Repletion underway. Will plan to recheck afternoon bmp.  Formal daily progress note to follow later.  Guadalupe Dawn MD PGY-3 Family Medicine Resident

## 2019-01-27 NOTE — Progress Notes (Signed)
Family Medicine Teaching Service Daily Progress Note Intern Pager: [phone]  Patient name: Deborah [name] Medical record number: [id number] Date of birth: 24-May-1965 Age: 53 y.o. Gender: female  Primary Care Provider: [name], DO Consultants: none Code Status: full  Pt Overview and Major Events to Date:  11/20 admitted to triad hospitalists 11/21 transferred to fpts, cardiac stress test  Assessment and Plan: 53 year old female who presents for chest pain.  Troponins found to be mildly elevated, with subsequent drop in EF on repeat echo.  Chest pain Patient with troponin stable at 50, 50, 47.  Repeat echo with EF of 40 to 45%, acutely worsened as EF was 60 to 65% on 11/2017.  Patient to undergo stress test this a.m.  Recertification looks good, with no abnormality seen on cholesterol panel.  Last A1c 9.5 on 10/7.  7% 10-year risk ASCVD risk calculated.  Heart score 3. Will follow up stress test 2 day and consult cardiology if abnormality appreciated.  Patient's major risk factors are uncontrolled diabetes, discussed below. -Vital signs per floor routine -Follow-up stress test -Plan to consult cardiology if stress test abnormal -Continue Lipitor 10 mg -Continue Norvasc 10 mg, Lasix 20 mg, metoprolol 25 mg -Nitro SL 0.4 mg as needed  Hypokalemia K2.6 this a.m. we will replete with KCl IV 10 mEq x4. Will likely give kdur 40, once when not NPO. Will recheck k this pm.  Hypomagnesemia Mg 1.3 this am. Will give 4g mag sulfate, recheck mg level this pm.  Hyperlipidemia Very well controlled on current Lipitor 10 mg.  Cholesterol 100, LDL 35, HDL 35.  Continue Lipitor 10 mg.  Type 2 diabetes Last A1c 9.5.  Takes Lantus 45 units, NovoLog 30 units 4 times daily with meals and at bedtime.  Patient would likely benefit from an SGLT2 or GLP-1 as an outpatient.  Patient with somewhat tenuous kidney function, making metformin benefit less certain.  CBG 369 on admission, most recent  169.  Receiving glargine 45 units, sensitive/scale insulin.  May consider reducing Lantus dosing pending sugars this a.m. -Follow-up with PCP as an outpatient for further management -Sensitive sliding scale insulin -Lantus 45 units for now, will consider reducing pending patient sugars this a.m. -Carb modified diet  Hypertension BP 112/71, has been in this range for last 12 hours.  Receiving amlodipine 10 mg, Lasix 20 mg -Continue amlodipine 10 mg -Lasix 20 mg -Could consider reducing dose of amlodipine if hypotensive  GERD Continue Protonix 40 mg twice daily  History of fibromyalgia Started on oxycodone 10 mg while in the hospital for unclear reasons.  Has not received any doses of this medication.  Last filled on 03/2017 for a 5-day supply.  Also receiving methocarbamol 500 mg twice daily. -Discontinue oxycodone -Continue Robaxin 500 mg twice daily   CKD stage IIIb GFR 40.  Creatinine 1.7 this a.m.  This is in line with her baseline which appears to be around 1.6.  Avoid nephrotoxic medications.  Could consider holding Lasix if creatinine bumps.  FEN/GI: Carb modified PPx: Lovenox  Disposition: Likely home pending clinical work-up  Subjective:  Doing well this a.m.  States that chest pain has resolved.  Objective: Temp:  [97.8 F (36.6 C)-99 F (37.2 C)] 97.9 F (36.6 C) (11/21 1018) Pulse Rate:  [85-102] 93 (11/21 1018) Resp:  [13-26] 20 (11/21 1018) BP: (97-130)/(36-91) 112/71 (11/21 1018) SpO2:  [91 %-100 %] 96 % (11/21 1018) Weight:  [148.7 kg] 148.7 kg (11/20 1647) Physical Exam: General: 53 year old African-American female, no  acute distress Cardiovascular: Regular rate rhythm, no M/R/G, skin warm and dry Respiratory: Lungs clear to auscultation bilaterally, no sensory muscle use Abdomen: Soft, nontender, nondistended Extremities: Able to move all extremities  Laboratory: Recent Labs  Lab 01/26/19 1021 01/27/19 0505  WBC 13.2* 12.2*  HGB 10.1* 9.3*  HCT  32.0* 28.8*  PLT 374 312   Recent Labs  Lab 01/26/19 1021 01/26/19 1313 01/27/19 0505  NA 134*  --  137  K 2.9*  --  2.6*  CL 96*  --  101  CO2 25  --  26  BUN 14  --  19  CREATININE 1.55*  --  1.68*  CALCIUM 8.4*  --  8.0*  PROT  --  6.4*  --   BILITOT  --  0.9  --   ALKPHOS  --  85  --   ALT  --  14  --   AST  --  14*  --   GLUCOSE 369*  --  157*    Imaging/Diagnostic Tests: Stress test pending  Guadalupe Dawn, MD 01/27/2019, 10:46 AM PGY-3, [place] Intern pager: [phone], text pages welcome

## 2019-01-27 NOTE — Progress Notes (Signed)
Notify provider, pt still have diarhea 3-4 times after last dose of imodium, pt request more imodium and reglan, v/s stale, refused any pain.follow up new order.

## 2019-01-27 NOTE — Progress Notes (Signed)
   Deborah [name] presented for a nuclear stress test today.  No immediate complications.  Stress imaging is pending at this time.  Preliminary EKG findings may be listed in the chart, but the stress test result will not be finalized until perfusion imaging is complete.   Kathyrn Drown, NP-C 01/27/2019, 9:30 AM

## 2019-01-27 NOTE — Progress Notes (Signed)
Pt request want to take reglan po tonight, notify provider, new order noted

## 2019-01-27 NOTE — Progress Notes (Signed)
Npo after midnight for stress test, pt verbalized understanding

## 2019-01-28 ENCOUNTER — Other Ambulatory Visit: Payer: Self-pay

## 2019-01-28 ENCOUNTER — Inpatient Hospital Stay (HOSPITAL_COMMUNITY): Payer: Medicare Other

## 2019-01-28 LAB — GLUCOSE, CAPILLARY
Glucose-Capillary: 90 mg/dL (ref 70–99)
Glucose-Capillary: 217 mg/dL — ABNORMAL HIGH (ref 70–99)
Glucose-Capillary: 173 mg/dL — ABNORMAL HIGH (ref 70–99)
Glucose-Capillary: 133 mg/dL — ABNORMAL HIGH (ref 70–99)
Glucose-Capillary: 108 mg/dL — ABNORMAL HIGH (ref 70–99)

## 2019-01-28 LAB — BASIC METABOLIC PANEL
Anion gap: 9 (ref 5–15)
BUN: 23 mg/dL — ABNORMAL HIGH (ref 6–20)
CO2: 25 mmol/L (ref 22–32)
Calcium: 8.2 mg/dL — ABNORMAL LOW (ref 8.9–10.3)
Chloride: 106 mmol/L (ref 98–111)
Creatinine, Ser: 1.57 mg/dL — ABNORMAL HIGH (ref 0.44–1.00)
GFR calc Af Amer: 43 mL/min — ABNORMAL LOW (ref >60)
GFR calc non Af Amer: 37 mL/min — ABNORMAL LOW (ref >60)
Glucose, Bld: 88 mg/dL (ref 70–99)
Potassium: 3.2 mmol/L — ABNORMAL LOW (ref 3.5–5.1)
Sodium: 140 mmol/L (ref 135–145)

## 2019-01-28 LAB — NM MYOCAR MULTI W/SPECT W/WALL MOTION / EF
Estimated workload: 1 METS
Exercise duration (min): 5 min
Exercise duration (sec): 13 s
MPHR: 166 {beats}/min
Peak HR: 96 {beats}/min
Percent HR: 57 %
Rest HR: 86 {beats}/min

## 2019-01-28 LAB — MAGNESIUM: Magnesium: 2 mg/dL (ref 1.7–2.4)

## 2019-01-28 MED ORDER — TECHNETIUM TC 99M TETROFOSMIN IV KIT
30.0000 | PACK | Freq: Once | INTRAVENOUS | Status: AC | PRN
  Administered 2019-01-28: 30 via INTRAVENOUS

## 2019-01-28 MED ORDER — HYDRALAZINE HCL 25 MG PO TABS
25.0000 mg | ORAL_TABLET | Freq: Three times a day (TID) | ORAL | Status: DC
  Administered 2019-01-29: 25 mg via ORAL
  Filled 2019-01-28: qty 1

## 2019-01-28 NOTE — Plan of Care (Signed)

## 2019-01-28 NOTE — Plan of Care (Signed)
  Problem: Education: Goal: Knowledge of General Education information will improve Description Including pain rating scale, medication(s)/side effects and non-pharmacologic comfort measures Outcome: Progressing   Problem: Health Behavior/Discharge Planning: Goal: Ability to manage health-related needs will improve Outcome: Progressing   

## 2019-01-28 NOTE — Progress Notes (Signed)
Patient reports chest pain rated at 8 out of 10, denies shortness of breath or troubles breathing. Chest pain protocol started. One sublingual nitroglycerin tab given. Patient's blood pressure was 94/63 after nitroglycerin given. Patient stated pain began to improve and rated chest pain at 7 out of 10. Nurse held second nitroglycerin tab due to drop in blood pressure. Ardelia Mems, MD paged. Patient currently lying in bed, refuses to rate chest pain, but states chest pain "is getting better". Current vitals are as follows:   01/28/19 1035  Vitals  BP 114/65  MAP (mmHg) 79  BP Method Automatic  Pulse Rate 92  Oxygen Therapy  SpO2 97 %  MEWS Score  MEWS RR 0  MEWS Pulse 0  MEWS Systolic 0  MEWS LOC 0  MEWS Temp 0  MEWS Score 0  MEWS Score Color Green   Bed lowest position, call bell within reach.

## 2019-01-28 NOTE — Discharge Summary (Addendum)
[facility] Discharge Summary  Patient name: Deborah [name] Medical record number: [id number] Date of birth: December 24, 1965 Age: 53 y.o. Gender: female Date of Admission: 01/26/2019  Date of Discharge: 01/29/2019 Admitting Physician: Lady Deutscher, MD  Primary Care Provider: [name], DO Consultants: none  Indication for Hospitalization: Chest pain  Discharge Diagnoses/Problem List:  Chest pain with newly diagnosed HFrEF Hypertension T2 DM Hyperlipidemia GERD CKD stage IIIb Chronic pain, fibromyalgia Gout Anxiety  Disposition: Home   Discharge Condition: Stable  Discharge Exam:  General: No apparent distress, nontoxic-appearing, lying down comfortably in bed Cardiovascular: s1 S2 appreciated, no murmurs Respiratory: CTA bilaterally, normal work of breathing, satting 99% on room air Abdomen: Obese abdomen, normal bowel sounds appreciated, soft, nontender  Brief Hospital Course:  This 53 year old female was admitted with chest pain radiating from her right arm to her shoulder.  In the ED her EKG was unchanged from prior, CXR with no infiltrate, troponin was 55 and patient was noted to be hypokalemic to 2.9.  Patient was admitted for ACS rule out and a cardiac stress test was ordered. The Myoview showed intermediate risk and cardiology was curb sided and recommended medical management as below.  Issues for Follow Up:   1. Per Cardiology: Manage decreased ejection fraction medically. Stop amlodipine, start hydralazine 25 mg 3 times daily, continue metoprolol 25, Lasix 10m daily.  Patient with history of anaphylaxis to ACE inhibitor.  2. If blood pressure can tolerate can consider adding spironolactone as an outpatient.  3. Patient would benefit from SGLT2 or GLP-1 therapy for better control of her type 2 diabetes 4. Monitor diarrhea and GI symptoms   Significant Procedures: none  Significant Labs and Imaging:  Recent Labs  Lab  01/26/19 1021 01/27/19 0505  WBC 13.2* 12.2*  HGB 10.1* 9.3*  HCT 32.0* 28.8*  PLT 374 312   Recent Labs  Lab 01/26/19 1021 01/26/19 1313 01/27/19 0505 01/27/19 1838 01/28/19 0341 01/29/19 0450  NA 134*  --  137 134* 140 140  K 2.9*  --  2.6* 3.1* 3.2* 3.4*  CL 96*  --  101 104 106 107  CO2 25  --  26 20* 25 27  GLUCOSE 369*  --  157* 194* 88 113*  BUN 14  --  19 23* 23* 18  CREATININE 1.55*  --  1.68* 1.83* 1.57* 1.39*  CALCIUM 8.4*  --  8.0* 7.9* 8.2* 8.4*  MG  --   --  1.3* 2.2 2.0  --   ALKPHOS  --  85  --   --   --   --   AST  --  14*  --   --   --   --   ALT  --  14  --   --   --   --   ALBUMIN  --  2.9*  --   --   --   --    No results found.  Dg Chest 2 View  Result Date: 01/26/2019 CLINICAL DATA:  Chest pain. EXAM: CHEST - 2 VIEW COMPARISON:  12/17/2018 and 07/19/2018 FINDINGS: The heart size and mediastinal contours are within normal limits. Both lungs are clear. The visualized skeletal structures are unremarkable. IMPRESSION: No active cardiopulmonary disease. Electronically Signed   By: JLorriane ShireM.D.   On: 01/26/2019 11:18   Nm Myocar Multi W/spect W/wall Motion / Ef  Result Date: 01/28/2019  There was no ST segment deviation noted during stress.  No T  wave inversion was noted during stress.  Defect 1: There is a medium defect of moderate severity present in the basal anterolateral, mid anterior, mid anterolateral and apical anterior location. This appears to be most c/w breast / chest wall artifact.  This is an intermediate risk study.  Nuclear stress EF: 36%. The left ventricular ejection fraction is moderately decreased (30-44%).    Results/Tests Pending at Time of Discharge: none  Discharge Medications:  Allergies as of 01/29/2019      Reactions   Contrast Media [iodinated Diagnostic Agents] Anaphylaxis   Cardiac arrest   Diazepam [name]   Isovue [iopamidol] Anaphylaxis   Patient had seizure like activity and then code post  100 cc of isovue 300   Lisinopril Anaphylaxis   Tongue and mouth swelling   Penicillins Palpitations   Has patient had a PCN reaction causing immediate rash, facial/tongue/throat swelling, SOB or lightheadedness with hypotension: Yes, heart races Has patient had a PCN reaction causing severe rash involving mucus membranes or skin necrosis: No Has patient had a PCN reaction that required hospitalization: Yes  Has patient had a PCN reaction occurring within the last 10 years: No   Acetaminophen Nausea Only, Other (See Comments)   Irritates stomach ulcer  Abdominal pain   Tolmetin Nausea Only   Other reaction(s): Other (See Comments) ULCER   Aspirin Other (See Comments)   Irritates stomach ulcer    Food Hives, Swelling   Carrots, ketchup    Nsaids Other (See Comments)   ULCER   Tramadol Nausea And Vomiting      Medication List    STOP taking these medications   amLODipine 10 MG tablet Commonly known as: NORVASC     TAKE these medications   allopurinol 100 MG tablet Commonly known as: ZYLOPRIM Take 1 tablet (100 mg total) by mouth daily.   atorvastatin 10 MG tablet Commonly known as: LIPITOR Take 1 tablet (10 mg total) by mouth daily.   azelastine 0.1 % nasal spray Commonly known as: ASTELIN Place 2 sprays into both nostrils 2 (two) times daily. Use in each nostril as directed   blood glucose meter kit and supplies Kit Dispense based on patient and insurance preference. Use up to four times daily as directed. (FOR ICD-9 250.00, 250.01).   cetirizine 10 MG tablet Commonly known as: ZYRTEC Take 1 tablet (10 mg total) by mouth daily.   DULoxetine 30 MG capsule Commonly known as: Cymbalta Take 1 capsule (30 mg total) by mouth 2 (two) times daily.   fluticasone 50 MCG/ACT nasal spray Commonly known as: FLONASE Place 2 sprays into both nostrils daily as needed for allergies or rhinitis.   furosemide 20 MG tablet Commonly known as: LASIX Take 1 tablet (20 mg total) by  mouth daily. Hold for 2 days What changed: additional instructions   hydrALAZINE 25 MG tablet Commonly known as: APRESOLINE Take 1 tablet (25 mg total) by mouth 3 (three) times daily. For blood pressure   hydrOXYzine 10 MG tablet Commonly known as: ATARAX/VISTARIL Take 1 tablet (10 mg total) by mouth 3 (three) times daily as needed. What changed: reasons to take this   insulin aspart 100 UNIT/ML injection Commonly known as: NovoLOG Inject 30 Units into the skin 4 (four) times daily -  with meals and at bedtime.   Lantus SoloStar 100 UNIT/ML Solostar Pen Generic drug: Insulin Glargine Inject 45 Units into the skin at bedtime.   loperamide 2 MG capsule Commonly known as: IMODIUM Take 2 capsules (  4 mg total) by mouth as needed for diarrhea or loose stools.   Melatonin 3 MG Tabs Take 2 tablets (6 mg total) by mouth at bedtime.   methocarbamol 500 MG tablet Commonly known as: ROBAXIN Take 500 mg by mouth 2 (two) times a day.   metoCLOPramide 10 MG tablet Commonly known as: REGLAN Take 1 tablet (10 mg total) by mouth 3 (three) times daily before meals.   metoprolol succinate 25 MG 24 hr tablet Commonly known as: TOPROL-XL Take 1 tablet (25 mg total) by mouth daily.   nitroGLYCERIN 0.4 MG SL tablet Commonly known as: NITROSTAT Place 1 tablet (0.4 mg total) under the tongue every 5 (five) minutes as needed for chest pain.   ondansetron 4 MG disintegrating tablet Commonly known as: Zofran ODT Take 1 tablet (4 mg total) by mouth every 8 (eight) hours as needed for nausea or vomiting.   pantoprazole 40 MG tablet Commonly known as: PROTONIX Take 1 tablet (40 mg total) by mouth 2 (two) times daily.       Discharge Instructions: Please refer to Patient Instructions section of EMR for full details.  Patient was counseled important signs and symptoms that should prompt return to medical care, changes in medications, dietary instructions, activity restrictions, and follow up  appointments.   Follow-Up Appointments: Follow-up Information    [name], DO. Go on 02/06/2019.   Specialty: Family Medicine Why: _0 :30pm Contact information: [address] [place] [address] [phone]           Gifford Shave, MD 01/30/2019, 12:12 PM PGY-1, [place] Upper-Level Resident Addendum I have independently interviewed and examined the patient. I have discussed the above with the original author and agree with their documentation. Please see also any attending notes.    Milus Banister, DO PGY-2, [facility] 02/05/2019 8:50 AM  FPTS Service pager: [phone] (text pages welcome through [facility])

## 2019-01-28 NOTE — Progress Notes (Signed)
Family Medicine Teaching Service Daily Progress Note Intern Pager: [phone]  Patient name: Deborah [name] Medical record number: [id number] Date of birth: 08/01/1965 Age: 53 y.o. Gender: female  Primary Care Provider: [name], DO Consultants: Cards (s/o) Code Status: Full  Pt Overview and Major Events to Date:  01/26/2019 - Admitted with Chest pain radiating to R arm and shoulder   Assessment and Plan: Deborah [name] is a 53 year old female with PMH significant for type 2 diabetes, thrombotic stroke, vaginal reflux, fibromyalgia gastroparesis, obesity, and HTN, admitted for chest pain radiating to right arm and shoulder.   Chest pain, improved: Patient had another episode of chest pain this morning that was reproducible to palpation on physical exam.  Repeat echo shows EF 40-45% acutely worsened (EF 60-65% in September 2019).  Myoview Stress test 01/27/2019 showing "intermediate risk" with EF at 36%. Risk stratification looks good, no abnormality appreciated on lipid panel. ASCVD risk calculated at 7%, heart score 3.  Troponins 55 on admission at baseline of 25-47. Patient with negative cath last year, therefore cardiology will not see (per 11/20 note). -Vital signs per floor routine -Per Cardiology Recs: Manage decreased ejection fraction medically.  Stop amlodipine, start hydralazine 25 mg 3 times daily, continue metoprolol 25, Lasix 20mg  daily.  If blood pressure not better can consider changing metoprolol to carvedilol and/or adding on Aldactone in a couple of days outpatient. PATIENT WOULD BENEFIT FROM WEIGHT LOSS. -Continue Lipitor 10 mg -Nitro 0.4 mg as needed  Hypertension: BP 111/59 this am 11/22.  -Stop Amlodipine 10 mg -Start hydralazine 25 mg 3 times daily continue metoprolol 25, Lasix 10 mg daily  Type 2 diabetes mellitus: CBG on admission 369, today 11/22 is 90. Last A1c 9.5% on 12/13/2018. At home uses NovoLog 30 units 4xdaily qACHS, Lantus 45 units daily  at bedtime.  Patient would benefit from SGLT2 or GLP-1 outpatient. -sSSI while hospitalized. -Lantus 45 units daily -Plan to follow-up with PCP outpatient for further management -Carb modified diet  Diarrhea, improved: Since yesterday patient is having frequent bouts of diarrhea, reported 8-10 episodes daily. She was also given 4g Mag yesterday. Diarrhea is better after imodium. -C. difficile testing negative -Imodium  Hypokalemia: on admission 2.9, today 3.2 . Magnesium has been repleted - Repleted with KDur 40 BID  Hypomagnesemia, resolved: Mag 1.3 on 11/21. Was repleted with 4 g mag sulfate, repeat shows 2.0.  Hyperlipidemia: Well-controlled on current atorvastatin 10 mg daily. -Continue atorvastatin 10 mg daily  GERD: Pantoprazole 40 mg twice daily  [name], improved: patient with chronic shortness of breath. Was having some nasal congestion that has resolved. Is satting 99% on RA.  -Monitor  Chronic pain  fibromyalgia: Robaxin 500 mg twice daily -Avoid opioids -Continue Robaxin 500 mg twice daily  CKD 3B: GFR 43, creatinine 1.83 > 1.57 today 11/22.  Baseline creatinine about 1.6. -Avoid nephrotoxic medications -Could consider holding Lasix if creatinine increases  Gout: -Allopurinol 100 mg  History of anxiety: wants to be restarted on Ativan -Cymbalta 30 mg twice daily -Hydroxyzine 10 mg 3 times daily as needed   FEN/GI: Carb modified diet PPx: Lovenox  Disposition: Likely home pending results of stress testing  Subjective:  Patient seen resting in bed, chest pain that is reproducible to touch on physical exam.  Diarrhea has significantly decreased.  Objective: Temp:  [97.6 F (36.4 C)-98.3 F (36.8 C)] 98 F (36.7 C) (11/22 0808) Pulse Rate:  [86-94] 87 (11/22 0808) Resp:  [17-20] 17 (11/22 0808) BP: (  105-130)/(50-89) 115/61 (11/22 4132) SpO2:  [96 %-98 %] 97 % (11/22 0808) Weight:  [151.9 kg] 151.9 kg (11/22 0418) Physical Exam: General:  No apparent distress, nontoxic-appearing Cardiovascular: s1 S2 appreciated, no murmurs Respiratory: CTA bilaterally, normal work of breathing, satting 99% on room air Abdomen: Obese abdomen, normal bowel sounds appreciated, soft, nontender  Laboratory: Recent Labs  Lab 01/26/19 1021 01/27/19 0505  WBC 13.2* 12.2*  HGB 10.1* 9.3*  HCT 32.0* 28.8*  PLT 374 312   Recent Labs  Lab 01/26/19 1313 01/27/19 0505 01/27/19 1838 01/28/19 0341  NA  --  137 134* 140  K  --  2.6* 3.1* 3.2*  CL  --  101 104 106  CO2  --  26 20* 25  BUN  --  19 23* 23*  CREATININE  --  1.68* 1.83* 1.57*  CALCIUM  --  8.0* 7.9* 8.2*  PROT 6.4*  --   --   --   BILITOT 0.9  --   --   --   ALKPHOS 85  --   --   --   ALT 14  --   --   --   AST 14*  --   --   --   GLUCOSE  --  157* 194* 88    Magnesium: 1.3> 2.2> 2.0 on 11/22   Imaging/Diagnostic Tests: Dg Chest 2 View  Result Date: 01/26/2019 CLINICAL DATA:  Chest pain. EXAM: CHEST - 2 VIEW COMPARISON:  12/17/2018 and 07/19/2018 FINDINGS: The heart size and mediastinal contours are within normal limits. Both lungs are clear. The visualized skeletal structures are unremarkable. IMPRESSION: No active cardiopulmonary disease. Electronically Signed   By: Lorriane Shire M.D.   On: 01/26/2019 11:18    Daisy Floro, DO 01/28/2019, 8:27 AM PGY-2, [place] Intern pager: [phone], text pages welcome

## 2019-01-28 NOTE — Progress Notes (Signed)
Pt refused to take am pills at this time, states will take med with food, hands med to rn day shift

## 2019-01-29 LAB — BASIC METABOLIC PANEL
Anion gap: 6 (ref 5–15)
BUN: 18 mg/dL (ref 6–20)
CO2: 27 mmol/L (ref 22–32)
Calcium: 8.4 mg/dL — ABNORMAL LOW (ref 8.9–10.3)
Chloride: 107 mmol/L (ref 98–111)
Creatinine, Ser: 1.39 mg/dL — ABNORMAL HIGH (ref 0.44–1.00)
GFR calc Af Amer: 50 mL/min — ABNORMAL LOW (ref >60)
GFR calc non Af Amer: 43 mL/min — ABNORMAL LOW (ref >60)
Glucose, Bld: 113 mg/dL — ABNORMAL HIGH (ref 70–99)
Potassium: 3.4 mmol/L — ABNORMAL LOW (ref 3.5–5.1)
Sodium: 140 mmol/L (ref 135–145)

## 2019-01-29 LAB — GLUCOSE, CAPILLARY
Glucose-Capillary: 115 mg/dL — ABNORMAL HIGH (ref 70–99)
Glucose-Capillary: 107 mg/dL — ABNORMAL HIGH (ref 70–99)

## 2019-01-29 MED ORDER — FUROSEMIDE 20 MG PO TABS: 20.0000 mg | ORAL_TABLET | Freq: Every day | ORAL | Status: DC

## 2019-01-29 MED ORDER — HYDRALAZINE HCL 25 MG PO TABS: 25.0000 mg | ORAL_TABLET | Freq: Three times a day (TID) | ORAL | 0 refills | Status: DC

## 2019-01-29 MED ORDER — LOPERAMIDE HCL 2 MG PO CAPS: 4.0000 mg | ORAL_CAPSULE | ORAL | 0 refills | Status: DC | PRN

## 2019-01-29 NOTE — Progress Notes (Signed)
Patient refusing morning medicines, says she will take them with her breakfast. Also, patient refuses to get weighed this morning, says she will get up for a weight after breakfast.

## 2019-01-29 NOTE — Progress Notes (Signed)
Patient as been sleeping well throughout the night, very flat affect, but she stares that she is feeling fine. No chest pain since her episode earlier today.

## 2019-01-29 NOTE — Progress Notes (Addendum)
Discharge education and medication education given to patient  with teach back. Education on low sodium diet and increasing activity slowly given. All questions and concerns answered.  Peripheral IV and telemetry leads removed. All patient belongings given to patient, these include: cell phone and charger, clothing, shoes, and jewelry. Patient transported to main entrance by nurse tech.

## 2019-01-29 NOTE — Progress Notes (Signed)
Patient took telemetry leads off while getting dressed, states she will be discharged today and refuses to wear telemetry leads. Nurse encouraged patient multiple times to reattach leads.  Education given by nurse on importance of wearing telemetry box. Ardelia Mems MD paged.

## 2019-01-29 NOTE — TOC Initial Note (Signed)
Transition of Care [facility]) - Initial/Assessment Note    Patient Details  Name: Deborah [name] MRN: [id number] Date of Birth: 20-Nov-1965  Transition of Care [facility]) CM/SW Contact:    Zenon Mayo, RN Phone Number: 01/29/2019, 10:21 AM  Clinical Narrative:                 From home alone, she states she has no needs. She has transportation at [employer] and has no issues with getting medications.  She is for dc today.  Expected Discharge Plan: Home/Self Care Barriers to Discharge: No Barriers Identified   Patient Goals and CMS Choice Patient states their goals for this hospitalization and ongoing recovery are:: do better   Choice offered to / list presented to : NA  Expected Discharge Plan and Services Expected Discharge Plan: Home/Self Care In-house Referral: NA Discharge Planning Services: CM Consult Post Acute Care Choice: NA Living arrangements for the past 2 months: Apartment                 DME Arranged: (NA)         HH Arranged: NA          Prior Living Arrangements/Services Living arrangements for the past 2 months: Apartment Lives with:: Self Patient language and need for interpreter reviewed:: Yes Do you feel safe going back to the place where you live?: Yes      Need for Family Participation in Patient Care: No (Comment) Care giver support system in place?: No (comment)   Criminal Activity/Legal Involvement Pertinent to Current Situation/Hospitalization: No - Comment as needed  Activities of Daily Living Home Assistive Devices/Equipment: Bedside commode/3-in-1, Cane (specify quad or straight), Wheelchair, Environmental consultant (specify type) ADL Screening (condition at time of admission) Patient's cognitive ability adequate to safely complete daily activities?: Yes Is the patient deaf or have difficulty hearing?: No Does the patient have difficulty seeing, even when wearing glasses/contacts?: No Does the patient have difficulty concentrating, remembering, or  making decisions?: No Patient able to express need for assistance with ADLs?: Yes Does the patient have difficulty dressing or bathing?: No Independently performs ADLs?: Yes (appropriate for developmental age) Does the patient have difficulty walking or climbing stairs?: Yes Weakness of Legs: Both Weakness of Arms/Hands: None  Permission Sought/Granted                  Emotional Assessment   Attitude/Demeanor/Rapport: Engaged Affect (typically observed): Appropriate Orientation: : Oriented to Self, Oriented to Place, Oriented to  Time, Oriented to Situation Alcohol / Substance Use: Not Applicable Psych Involvement: No (comment)  Admission diagnosis:  Chest pain, unspecified type [R07.9] Patient Active Problem List   Diagnosis Date Noted  . Congestion of nasal sinus 01/24/2019  . Chronic diastolic heart failure ([place]) 12/19/2018  . Intractable nausea and vomiting 12/17/2018  . Hypoxia 12/17/2018  . Hyperglycemia 12/17/2018  . Elevated troponin I level   . Vomiting 07/18/2018  . Abdominal pain 07/17/2018  . Hyperkalemia 07/17/2018  . Cardiac arrest ([place]) 11/29/2017  . Pelvic mass 11/29/2017  . Leukocytosis 11/29/2017  . Anxiety 11/29/2017  . Allergic reaction to contrast media, initial encounter 11/28/2017  . Palliative care encounter   . Back pain 03/19/2017  . Stroke (cerebrum) ([place]) 03/19/2017  . GERD (gastroesophageal reflux disease) 03/19/2017  . Depression 03/19/2017  . Obesity   . Urinary tract infection 08/16/2016  . Normocytic normochromic anemia 08/16/2016  . Gastroparesis 08/16/2016  . Intractable vomiting with nausea 06/17/2016  . Diabetic gastroparesis ([place]) 06/05/2016  .  Gout 06/05/2016  . AKI (acute kidney injury) ([place]) 12/06/2015  . Chest pain 09/26/2015  . Hypokalemia 09/26/2015  . Hypomagnesemia 09/26/2015  . Nausea and vomiting 08/20/2015  . Gout flare 05/27/2015  . Lower abdominal pain 05/26/2015  . DKA (diabetic ketoacidoses) ([place])  05/25/2015  . Uncontrolled type 2 diabetes mellitus with diabetic neuropathy, with long-term current use of insulin ([place]) 05/25/2015  . Type 2 diabetes mellitus with hyperglycemia, with long-term current use of insulin ([place]) 05/25/2015  . CKD (chronic kidney disease), stage II 05/25/2015  . Essential hypertension, benign 09/28/2013   PCP:  [name], DO Pharmacy:   [place], [place] - [address] [place] [address] Phone: [phone] Fax: [place], [place] [address] Phone: [phone] Fax: [phone]     Social Determinants of Health (SDOH) Interventions    Readmission Risk Interventions Readmission Risk Prevention Plan 01/29/2019 12/19/2018  Transportation Screening Complete Complete  PCP or Specialist Appt within 3-5 Days Complete Not Complete  Not Complete comments - Left a message asking for appointment within 5-7 days  [place] or [place] Complete Not Complete  Social Work Consult for [place] Planning/Counseling Complete Not Complete  Palliative Care Screening Not Applicable Not Applicable  Medication Review (RN Care Manager) Complete Referral to Pharmacy  Some recent data might be hidden

## 2019-01-29 NOTE — TOC Transition Note (Signed)
Transition of Care [facility]) - CM/SW Discharge Note   Patient Details  Name: Deborah [name] MRN: [id number] Date of Birth: 1966-02-14  Transition of Care [facility]) CM/SW Contact:  Zenon Mayo, RN Phone Number: 01/29/2019, 10:22 AM   Clinical Narrative:    From home alone, she states she has no needs. She has transportation at [employer] and has no issues with getting medications.  She is for dc today.   Final next level of care: Home/Self Care Barriers to Discharge: No Barriers Identified   Patient Goals and CMS Choice Patient states their goals for this hospitalization and ongoing recovery are:: do better   Choice offered to / list presented to : NA  Discharge Placement                       Discharge Plan and Services In-house Referral: NA Discharge Planning Services: CM Consult Post Acute Care Choice: NA          DME Arranged: (NA)         HH Arranged: NA          Social Determinants of Health (SDOH) Interventions     Readmission Risk Interventions Readmission Risk Prevention Plan 01/29/2019 12/19/2018  Transportation Screening Complete Complete  PCP or Specialist Appt within 3-5 Days Complete Not Complete  Not Complete comments - Left a message asking for appointment within 5-7 days  [place] or [place] Complete Not Complete  Social Work Consult for [place] Planning/Counseling Complete Not Complete  Palliative Care Screening Not Applicable Not Applicable  Medication Review Press photographer) Complete Referral to Pharmacy  Some recent data might be hidden

## 2019-01-29 NOTE — Progress Notes (Signed)
Family Medicine Teaching Service Daily Progress Note Intern Pager: [phone]  Patient name: Deborah [name] Medical record number: [id number] Date of birth: 05-25-1965 Age: 53 y.o. Gender: female  Primary Care Provider: [name], DO Consultants: Cards (s/o) Code Status: Full  Pt Overview and Major Events to Date:  01/26/2019 - Admitted with Chest pain radiating to R arm and shoulder   Assessment and Plan: ELIZEBATH WEVER is a 53 year old female with PMH significant for type 2 diabetes, thrombotic stroke, vaginal reflux, fibromyalgia gastroparesis, obesity, and HTN, admitted for chest pain radiating to right arm and shoulder.   Chest pain, improved: Patient had another episode of chest pain this morning that was reproducible to palpation on physical exam.  Repeat echo shows EF 40-45% acutely worsened (EF 60-65% in September 2019).  Myoview Stress test 01/27/2019 showing "intermediate risk" with EF at 36%. Risk stratification looks good, no abnormality appreciated on lipid panel. ASCVD risk calculated at 7%, heart score 3.  Troponins 55 on admission at baseline of 25-47. Patient with negative cath last year, therefore cardiology will not see (per 11/20 note).  Yesterday 11/22 patient called out with substernal chest pain and lower abdominal pain after returning from her stress test.  She was evaluated and the pain was reproducible upon palpation.  We suspect most likely chest wall pain/costochondritis. -Vital signs per floor routine -Per Cardiology Recs: Manage decreased ejection fraction medically.  Stop amlodipine, start hydralazine 25 mg 3 times daily, continue metoprolol 25, Lasix 20mg  daily.  If blood pressure not better, consider changing metoprolol to carvedilol and/or adding on Aldactone in a couple of days outpatient. PATIENT WOULD BENEFIT FROM WEIGHT LOSS. -Continue Lipitor 10 mg -Nitro 0.4 mg as needed -Patient is stable and ready for discharge at this  time  Hypertension: BP 120/58 this morning 11/23 -Stop Amlodipine 10 mg -Start hydralazine 25 mg 3 times daily continue metoprolol 25, Lasix 10 mg daily  Type 2 diabetes mellitus: CBG on admission 369, today 11/23 it is 115.  Last A1c 9.5% on 12/13/2018. At home uses NovoLog 30 units 4xdaily qACHS, Lantus 45 units daily at bedtime.  Patient would benefit from SGLT2 or GLP-1 outpatient. -sSSI while hospitalized. -Lantus 45 units daily -Plan to follow-up with PCP outpatient for further management -Carb modified diet  Diarrhea, improved: Since yesterday patient is having frequent bouts of diarrhea, reported 8-10 episodes daily. She was also given 4g Mag yesterday. Diarrhea is better after imodium. -C. difficile testing negative -Imodium  Hypokalemia: on admission 2.9, today 3.4 . Magnesium has been repleted - Repleted with KDur 40 BID  Hypomagnesemia, resolved: Mag 1.3 on 11/21. Was repleted with 4 g mag sulfate, repeat shows 2.0 111/22.  Hyperlipidemia: Well-controlled on current atorvastatin 10 mg daily. -Continue atorvastatin 10 mg daily  GERD: Pantoprazole 40 mg twice daily  [name], improved: patient with chronic shortness of breath. Was having some nasal congestion that has resolved. Is satting 99% on RA.  -Monitor  Chronic pain  fibromyalgia: Robaxin 500 mg twice daily -Avoid opioids -Continue Robaxin 500 mg twice daily  CKD 3B: GFR 43, creatinine 1.83 > 1.57> 1.39 today 11/23.  Baseline creatinine about 1.6. -Avoid nephrotoxic medications -Could consider holding Lasix if creatinine increases  Gout: -Allopurinol 100 mg  History of anxiety: wants to be restarted on Ativan -Cymbalta 30 mg twice daily -Hydroxyzine 10 mg 3 times daily as needed   FEN/GI: Carb modified diet PPx: Lovenox  Disposition: Likely home today   Subjective:  Patient is doing  well this morning.  She reports her diarrhea has gotten better.  Denies any chest pain, shortness of breath  overnight.  Objective: Temp:  [97.5 F (36.4 C)-98.2 F (36.8 C)] 98.2 F (36.8 C) (11/23 0358) Pulse Rate:  [80-92] 86 (11/23 0358) Resp:  [16-20] 18 (11/23 0358) BP: (94-129)/(58-81) 120/58 (11/23 0358) SpO2:  [92 %-100 %] 92 % (11/23 0358) Physical Exam: General: No apparent distress, nontoxic-appearing, lying down comfortably in bed Cardiovascular: s1 S2 appreciated, no murmurs Respiratory: CTA bilaterally, normal work of breathing, satting 99% on room air Abdomen: Obese abdomen, normal bowel sounds appreciated, soft, nontender  Laboratory: Recent Labs  Lab 01/26/19 1021 01/27/19 0505  WBC 13.2* 12.2*  HGB 10.1* 9.3*  HCT 32.0* 28.8*  PLT 374 312   Recent Labs  Lab 01/26/19 1313 01/27/19 0505 01/27/19 1838 01/28/19 0341  NA  --  137 134* 140  K  --  2.6* 3.1* 3.2*  CL  --  101 104 106  CO2  --  26 20* 25  BUN  --  19 23* 23*  CREATININE  --  1.68* 1.83* 1.57*  CALCIUM  --  8.0* 7.9* 8.2*  PROT 6.4*  --   --   --   BILITOT 0.9  --   --   --   ALKPHOS 85  --   --   --   ALT 14  --   --   --   AST 14*  --   --   --   GLUCOSE  --  157* 194* 88    Magnesium: 1.3> 2.2> 2.0 on 11/22   Imaging/Diagnostic Tests: Dg Chest 2 View  Result Date: 01/26/2019 CLINICAL DATA:  Chest pain. EXAM: CHEST - 2 VIEW COMPARISON:  12/17/2018 and 07/19/2018 FINDINGS: The heart size and mediastinal contours are within normal limits. Both lungs are clear. The visualized skeletal structures are unremarkable. IMPRESSION: No active cardiopulmonary disease. Electronically Signed   By: Lorriane Shire M.D.   On: 01/26/2019 11:18   Nm Myocar Multi W/spect W/wall Motion / Ef  Result Date: 01/28/2019  There was no ST segment deviation noted during stress.  No T wave inversion was noted during stress.  Defect 1: There is a medium defect of moderate severity present in the basal anterolateral, mid anterior, mid anterolateral and apical anterior location. This appears to be most c/w breast /  chest wall artifact.  This is an intermediate risk study.  Nuclear stress EF: 36%. The left ventricular ejection fraction is moderately decreased (30-44%).     Gifford Shave, MD 01/29/2019, 6:22 AM PGY-1, [place] Intern pager: [phone], text pages welcome

## 2019-01-29 NOTE — Discharge Instructions (Signed)
Thank you for allowing [place] to participate in your care!    You were admitted for chest pain and worked up by cardiology.  You had a 2 part stress test which showed you do have heart failure that will require medical management using medications rather than procedures.  Cardiology provided recommendations on what medications they wanted to start you on and which ones you were to discontinue.  These changes have been made to your discharge medications.  I would like you to have close follow-up with cardiology after your discharge.  Please call your cardiologist to schedule your appointment in the next few weeks.  With regards to your bowel issues I am prescribing Lomotil for you at discharge.  Please take this if you are having issues with diarrhea.  Do not take it otherwise.  If you experience worsening of your admission symptoms, develop shortness of breath, life threatening emergency, suicidal or homicidal thoughts you must seek medical attention immediately by calling 911 or calling your MD immediately  if symptoms less severe.

## 2019-01-30 ENCOUNTER — Ambulatory Visit: Payer: Medicare Other | Admitting: Cardiology

## 2019-02-05 NOTE — Progress Notes (Signed)
   Subjective:    Patient ID: Deborah [name], female    DOB: 09-Jul-1965, 53 y.o.   MRN: [id number]  [place] Telemedicine Visit  Patient consented to have virtual visit. Method of visit: Video was attempted, but technology challenges prevented patient from using video, so visit was conducted via telephone.  Encounter participants: Patient: Deborah [name] - located at Home Provider: Caroline More - located at [facility] Others (if applicable): None  Chief Complaint: Chest pain  HPI: Chest pain Admitted from 11/20-11/23 for chest pain. EKG was unchanged from prior and initial troponin was 55. Myoview showed intermediate risk and cardiology recommended medical management only. Advised to stop amlodipine, start hydralazine 25 mg 3 times daily, continue metoprolol 25, Lasix 20mg  daily. Recommended for spironolactone if BP can tolerate.    Patient reports she feels well. States she still has some cough. Denies any chest pain. Did report some nausea today. No vomiting. Does report a stomach ache. No new foods. Has not been really eating, decreased appetite. No fevers. No sick contacts. No new medication changes.   Does not have a BP cuff at home so unsure what her BP has been. No edema, no dizziness, no CP, no SOB.   Reports that her sugars are looking good. Reports her fasting is ~220.   ROS: per HPI  Pertinent PMHx: cardia arrest, diabetic gastroparesis, GERD, T2DM  Exam:  Respiratory: speaking full sentences, no increased WOB  Assessment/Plan:  Chest pain Chest pain has now resolved.  Dates that she feels improved since being discharged from the hospital.  Is continuing her blood pressure medicines as prescribed.  Has stopped amlodipine.  Has started hydralazine as well as metoprolol and Lasix.  Does not have a blood pressure cuff at home.  I advised that she please follow-up in clinic to have a blood pressure follow-up.  Per discharge summary if blood  pressure can tolerate can consider adding spironolactone as an outpatient.  Type 2 diabetes mellitus with hyperglycemia, with long-term current use of insulin ([place]) Per discharge summary patient would benefit from SGLT2 or GLP-1 therapy for better control of her type 2 diabetes.  Patient states her sugars are looking good.  When patient follows up for blood pressure control she should have CBG versus A1c checked as well.  If sugars can tolerate can consider adding SGLT2 versus GLP-1 such as discharge summary recommended.  Abdominal pain Patient with abdominal pain.  Thinks it might be related to her not eating it.  I advised that if she continues abdominal pain after eating lunch then she should please let us know to follow-up.  No vomiting or diarrhea.  Follow-up if no improvement.    Time spent during visit with patient: 15 minutes

## 2019-02-06 ENCOUNTER — Other Ambulatory Visit: Payer: Self-pay

## 2019-02-06 ENCOUNTER — Telehealth (INDEPENDENT_AMBULATORY_CARE_PROVIDER_SITE_OTHER): Payer: Medicare Other | Admitting: Family Medicine

## 2019-02-06 DIAGNOSIS — Z794 Long term (current) use of insulin: Secondary | ICD-10-CM

## 2019-02-06 DIAGNOSIS — I639 Cerebral infarction, unspecified: Secondary | ICD-10-CM

## 2019-02-06 DIAGNOSIS — R079 Chest pain, unspecified: Secondary | ICD-10-CM

## 2019-02-06 DIAGNOSIS — E1165 Type 2 diabetes mellitus with hyperglycemia: Secondary | ICD-10-CM

## 2019-02-06 DIAGNOSIS — R109 Unspecified abdominal pain: Secondary | ICD-10-CM | POA: Diagnosis not present

## 2019-02-06 DIAGNOSIS — Z09 Encounter for follow-up examination after completed treatment for conditions other than malignant neoplasm: Secondary | ICD-10-CM

## 2019-02-06 MED ORDER — METHOCARBAMOL 500 MG PO TABS: 500.0000 mg | ORAL_TABLET | Freq: Two times a day (BID) | ORAL | 0 refills | Status: DC | PRN

## 2019-02-08 NOTE — Assessment & Plan Note (Signed)
Patient with abdominal pain.  Thinks it might be related to her not eating it.  I advised that if she continues abdominal pain after eating lunch then she should please let us know to follow-up.  No vomiting or diarrhea.  Follow-up if no improvement.

## 2019-02-08 NOTE — Assessment & Plan Note (Signed)
Chest pain has now resolved.  Dates that she feels improved since being discharged from the hospital.  Is continuing her blood pressure medicines as prescribed.  Has stopped amlodipine.  Has started hydralazine as well as metoprolol and Lasix.  Does not have a blood pressure cuff at home.  I advised that she please follow-up in clinic to have a blood pressure follow-up.  Per discharge summary if blood pressure can tolerate can consider adding spironolactone as an outpatient.

## 2019-02-08 NOTE — Assessment & Plan Note (Signed)
Per discharge summary patient would benefit from SGLT2 or GLP-1 therapy for better control of her type 2 diabetes.  Patient states her sugars are looking good.  When patient follows up for blood pressure control she should have CBG versus A1c checked as well.  If sugars can tolerate can consider adding SGLT2 versus GLP-1 such as discharge summary recommended.

## 2019-02-16 ENCOUNTER — Encounter: Payer: Self-pay | Admitting: Family Medicine

## 2019-02-16 ENCOUNTER — Ambulatory Visit (INDEPENDENT_AMBULATORY_CARE_PROVIDER_SITE_OTHER): Payer: Medicare Other | Admitting: Family Medicine

## 2019-02-16 ENCOUNTER — Other Ambulatory Visit: Payer: Self-pay

## 2019-02-16 VITALS — BP 120/70 | HR 102

## 2019-02-16 DIAGNOSIS — F329 Major depressive disorder, single episode, unspecified: Secondary | ICD-10-CM | POA: Diagnosis not present

## 2019-02-16 DIAGNOSIS — I639 Cerebral infarction, unspecified: Secondary | ICD-10-CM

## 2019-02-16 DIAGNOSIS — I10 Essential (primary) hypertension: Secondary | ICD-10-CM | POA: Diagnosis present

## 2019-02-16 DIAGNOSIS — Z794 Long term (current) use of insulin: Secondary | ICD-10-CM | POA: Diagnosis not present

## 2019-02-16 DIAGNOSIS — F32A Depression, unspecified: Secondary | ICD-10-CM

## 2019-02-16 DIAGNOSIS — E1165 Type 2 diabetes mellitus with hyperglycemia: Secondary | ICD-10-CM

## 2019-02-16 DIAGNOSIS — I5032 Chronic diastolic (congestive) heart failure: Secondary | ICD-10-CM | POA: Diagnosis not present

## 2019-02-16 MED ORDER — DULAGLUTIDE 0.75 MG/0.5ML ~~LOC~~ SOAJ: 0.7500 mg | SUBCUTANEOUS | 3 refills | Status: DC

## 2019-02-16 MED ORDER — DULOXETINE HCL 40 MG PO CPEP: 40.0000 mg | ORAL_CAPSULE | Freq: Two times a day (BID) | ORAL | 2 refills | Status: DC

## 2019-02-16 MED ORDER — DULOXETINE HCL 40 MG PO CPEP: 30.0000 mg | ORAL_CAPSULE | Freq: Two times a day (BID) | ORAL | 2 refills | Status: DC

## 2019-02-16 NOTE — Assessment & Plan Note (Addendum)
BP well controlled at today's visit at 120/70. Patient is not checking BP at home - Start checking BP at home and keep diary - Cont Metoprolol XL 25mg  daily - Cont Hydralazine 25mg  TID - Cont Lasix 20mg  daily - Given good control will not start spironolactone at this time - Check BMP

## 2019-02-16 NOTE — Assessment & Plan Note (Signed)
Poorly controlled.  - Patient will cut out all sodas and sugary drinks. We will work together to make one positive lifestyle change at a time - Increase Lantus from 40U to 50U at night time - Adding Dulaglutide starting at 0.75mg  per week for the first week, then increase to 1.5mg  per week - F/u in 3 months

## 2019-02-16 NOTE — Assessment & Plan Note (Signed)
PHQ-9 of 24. Patient endorsing poorly controlled depression but would like to avoid adding medications which given how many she is taking is reasonable. - Increase Duloxetine to 40mg  BID and follow up; if no improvement can increase up to 120mg  per day

## 2019-02-16 NOTE — Patient Instructions (Signed)
It was great to see you today! Thank you for letting me participate in your care!  Today, we discussed your diabetes. Please start taking the new medication Dulaglutide I have prescribed for you. Please also increase the amount of Lantus you are taking at night to 50U per night.   I will see you in 3 months to see how you are doing.  If you don't hear from an Endocrinologist or a Cardiologist in the next 2 weeks please call our office.  Be well, Harolyn Rutherford, DO PGY-3, [facility]

## 2019-02-16 NOTE — Progress Notes (Signed)
Subjective: Chief Complaint  Patient presents with  . Diabetes  . Hypertension    HPI: Deborah [name] is a 53 y.o. presenting to clinic today to discuss the following:  T2DM Poorly controlled with last A1c at 9.5%. Patient is open to increasing Lantus at night, starting a GLP-1 agonist, and cutting out all sodas.  HTN Well controlled at today's visit. Patient is currently on Metoprolol, Lasix, and Hydralazine. She had anaphylaxis with an ACEi so not a candidate for those medications. No headaches or vision changes.  Depression Patient endorses depression and is wanting to have better control but understandably wants to limit the number of medications she is taking. PHQ-9 score of 27 but no SI or HI. She is taking a low dose of Cymbalta for chronic pain and firbomyalgia. Discussed increasing that today and following up.      ROS noted in HPI.    Social History   Tobacco Use  Smoking Status Never Smoker  Smokeless Tobacco Never Used    Objective: BP 120/70   Pulse (!) 102   LMP 10/10/2012   SpO2 97%  Vitals and nursing notes reviewed  Physical Exam Gen: Alert and Oriented x 3, NAD CV: RRR, no murmurs, normal S1, S2 split Resp: CTAB, no wheezing, rales, or rhonchi, comfortable work of breathing Skin: warm, dry, intact, no rashes  Assessment/Plan:  Essential hypertension, benign BP well controlled at today's visit at 120/70. Patient is not checking BP at home - Start checking BP at home and keep diary - Cont Metoprolol XL 25mg  daily - Cont Hydralazine 25mg  TID - Cont Lasix 20mg  daily - Given good control will not start spironolactone at this time - Check BMP  Depression PHQ-9 of 24. Patient endorsing poorly controlled depression but would like to avoid adding medications which given how many she is taking is reasonable. - Increase Duloxetine to 40mg  BID and follow up; if no improvement can increase up to 120mg  per day  Type 2 diabetes mellitus with  hyperglycemia, with long-term current use of insulin ([facility]) Poorly controlled.  - Patient will cut out all sodas and sugary drinks. We will work together to make one positive lifestyle change at a time - Increase Lantus from 40U to 50U at night time - Adding Dulaglutide starting at 0.75mg  per week for the first week, then increase to 1.5mg  per week - F/u in 3 months   PATIENT EDUCATION PROVIDED: See AVS    Diagnosis and plan along with any newly prescribed medication(s) were discussed in detail with this patient today. The patient verbalized understanding and agreed with the plan. Patient advised if symptoms worsen return to clinic or ER.    Orders Placed This Encounter  Procedures  . Basic Metabolic Panel    Standing Status:   Future    Standing Expiration Date:   02/16/2020  . Magnesium    Standing Status:   Future    Standing Expiration Date:   02/16/2020    Meds ordered this encounter  Medications  . DISCONTD: DULoxetine 40 MG CPEP    Sig: Take 30 mg by mouth 2 (two) times daily.    Dispense:  60 capsule    Refill:  2  . Dulaglutide 0.75 MG/0.5ML SOPN    Sig: Inject 0.75 mg into the skin once a week. Then inject 1.5mg  into the skin once a week starting with the second week.    Dispense:  4 pen    Refill:  3  .  DULoxetine HCl 40 MG CPEP    Sig: Take 40 mg by mouth 2 (two) times daily.    Dispense:  60 capsule    Refill:  [place], DO 02/16/2019, 4:26 PM PGY-3 [place]

## 2019-02-19 ENCOUNTER — Other Ambulatory Visit: Payer: Self-pay

## 2019-02-19 ENCOUNTER — Emergency Department (HOSPITAL_COMMUNITY): Payer: Medicare Other

## 2019-02-19 ENCOUNTER — Emergency Department (HOSPITAL_COMMUNITY)
Admission: EM | Admit: 2019-02-19 | Discharge: 2019-02-20 | Disposition: A | Discharge status: Home / Self Care | Payer: Medicare Other | Source: Home / Self Care

## 2019-02-19 ENCOUNTER — Encounter (HOSPITAL_COMMUNITY): Payer: Self-pay | Admitting: Emergency Medicine

## 2019-02-19 DIAGNOSIS — Z5321 Procedure and treatment not carried out due to patient leaving prior to being seen by health care provider: Secondary | ICD-10-CM | POA: Insufficient documentation

## 2019-02-19 DIAGNOSIS — R079 Chest pain, unspecified: Secondary | ICD-10-CM | POA: Insufficient documentation

## 2019-02-19 DIAGNOSIS — E1165 Type 2 diabetes mellitus with hyperglycemia: Secondary | ICD-10-CM | POA: Diagnosis not present

## 2019-02-19 DIAGNOSIS — R739 Hyperglycemia, unspecified: Secondary | ICD-10-CM | POA: Diagnosis not present

## 2019-02-19 LAB — CBC
HCT: 38.4 % (ref 36.0–46.0)
Hemoglobin: 11.9 g/dL — ABNORMAL LOW (ref 12.0–15.0)
MCH: 28.3 pg (ref 26.0–34.0)
MCHC: 31 g/dL (ref 30.0–36.0)
MCV: 91.4 fL (ref 80.0–100.0)
Platelets: 403 10*3/uL — ABNORMAL HIGH (ref 150–400)
RBC: 4.2 MIL/uL (ref 3.87–5.11)
RDW: 13.9 % (ref 11.5–15.5)
WBC: 13 10*3/uL — ABNORMAL HIGH (ref 4.0–10.5)
nRBC: 0 % (ref 0.0–0.2)

## 2019-02-19 LAB — BASIC METABOLIC PANEL
Anion gap: 16 — ABNORMAL HIGH (ref 5–15)
BUN: 20 mg/dL (ref 6–20)
CO2: 19 mmol/L — ABNORMAL LOW (ref 22–32)
Calcium: 8.9 mg/dL (ref 8.9–10.3)
Chloride: 100 mmol/L (ref 98–111)
Creatinine, Ser: 1.33 mg/dL — ABNORMAL HIGH (ref 0.44–1.00)
GFR calc Af Amer: 52 mL/min — ABNORMAL LOW (ref >60)
GFR calc non Af Amer: 45 mL/min — ABNORMAL LOW (ref >60)
Glucose, Bld: 395 mg/dL — ABNORMAL HIGH (ref 70–99)
Potassium: 3.7 mmol/L (ref 3.5–5.1)
Sodium: 135 mmol/L (ref 135–145)

## 2019-02-19 LAB — I-STAT BETA HCG BLOOD, ED (NOT ORDERABLE): I-stat hCG, quantitative: <5 m[IU]/mL (ref <5)

## 2019-02-19 LAB — TROPONIN I (HIGH SENSITIVITY): Troponin I (High Sensitivity): 25 ng/L — ABNORMAL HIGH (ref <18)

## 2019-02-19 LAB — LIPASE, BLOOD: Lipase: 25 U/L (ref 11–51)

## 2019-02-19 MED ORDER — ONDANSETRON 4 MG PO TBDP
4.0000 mg | ORAL_TABLET | Freq: Once | ORAL | Status: AC | PRN
  Administered 2019-02-19: 20:00:00 4 mg via ORAL
  Filled 2019-02-19: qty 1

## 2019-02-19 MED ORDER — SODIUM CHLORIDE 0.9% FLUSH: 3.0000 mL | Freq: Once | INTRAVENOUS | Status: DC

## 2019-02-19 NOTE — ED Notes (Signed)
Patient told registration her pain is increasing.

## 2019-02-19 NOTE — ED Triage Notes (Signed)
Patient is complaining of mid chest pain and abdominal pain. Patient states it started around 5 pm tonight. Patient states that she has been vomiting.

## 2019-02-20 ENCOUNTER — Other Ambulatory Visit: Payer: Self-pay

## 2019-02-20 ENCOUNTER — Other Ambulatory Visit (HOSPITAL_COMMUNITY): Payer: Self-pay

## 2019-02-20 ENCOUNTER — Emergency Department (HOSPITAL_COMMUNITY): Payer: Medicare Other

## 2019-02-20 ENCOUNTER — Inpatient Hospital Stay (HOSPITAL_COMMUNITY)
Admission: EM | Admit: 2019-02-20 | Discharge: 2019-02-23 | DRG: 638 | Disposition: A | Discharge status: Home / Self Care | Payer: Medicare Other | Attending: Family Medicine | Admitting: Family Medicine

## 2019-02-20 DIAGNOSIS — Z8673 Personal history of transient ischemic attack (TIA), and cerebral infarction without residual deficits: Secondary | ICD-10-CM

## 2019-02-20 DIAGNOSIS — R739 Hyperglycemia, unspecified: Secondary | ICD-10-CM | POA: Diagnosis present

## 2019-02-20 DIAGNOSIS — Z885 Allergy status to narcotic agent status: Secondary | ICD-10-CM

## 2019-02-20 DIAGNOSIS — K219 Gastro-esophageal reflux disease without esophagitis: Secondary | ICD-10-CM | POA: Diagnosis present

## 2019-02-20 DIAGNOSIS — E860 Dehydration: Secondary | ICD-10-CM | POA: Diagnosis present

## 2019-02-20 DIAGNOSIS — M797 Fibromyalgia: Secondary | ICD-10-CM | POA: Diagnosis present

## 2019-02-20 DIAGNOSIS — R079 Chest pain, unspecified: Secondary | ICD-10-CM

## 2019-02-20 DIAGNOSIS — Z880 Allergy status to penicillin: Secondary | ICD-10-CM

## 2019-02-20 DIAGNOSIS — R0902 Hypoxemia: Secondary | ICD-10-CM

## 2019-02-20 DIAGNOSIS — D631 Anemia in chronic kidney disease: Secondary | ICD-10-CM | POA: Diagnosis present

## 2019-02-20 DIAGNOSIS — F419 Anxiety disorder, unspecified: Secondary | ICD-10-CM | POA: Diagnosis present

## 2019-02-20 DIAGNOSIS — N179 Acute kidney failure, unspecified: Secondary | ICD-10-CM | POA: Diagnosis present

## 2019-02-20 DIAGNOSIS — Z886 Allergy status to analgesic agent status: Secondary | ICD-10-CM

## 2019-02-20 DIAGNOSIS — G8929 Other chronic pain: Secondary | ICD-10-CM | POA: Diagnosis present

## 2019-02-20 DIAGNOSIS — D259 Leiomyoma of uterus, unspecified: Secondary | ICD-10-CM

## 2019-02-20 DIAGNOSIS — G4700 Insomnia, unspecified: Secondary | ICD-10-CM | POA: Diagnosis present

## 2019-02-20 DIAGNOSIS — Z888 Allergy status to other drugs, medicaments and biological substances status: Secondary | ICD-10-CM

## 2019-02-20 DIAGNOSIS — M109 Gout, unspecified: Secondary | ICD-10-CM | POA: Diagnosis present

## 2019-02-20 DIAGNOSIS — E785 Hyperlipidemia, unspecified: Secondary | ICD-10-CM | POA: Diagnosis present

## 2019-02-20 DIAGNOSIS — Z79899 Other long term (current) drug therapy: Secondary | ICD-10-CM

## 2019-02-20 DIAGNOSIS — Z20828 Contact with and (suspected) exposure to other viral communicable diseases: Secondary | ICD-10-CM | POA: Diagnosis present

## 2019-02-20 DIAGNOSIS — N182 Chronic kidney disease, stage 2 (mild): Secondary | ICD-10-CM | POA: Diagnosis present

## 2019-02-20 DIAGNOSIS — I5032 Chronic diastolic (congestive) heart failure: Secondary | ICD-10-CM

## 2019-02-20 DIAGNOSIS — E1122 Type 2 diabetes mellitus with diabetic chronic kidney disease: Secondary | ICD-10-CM | POA: Diagnosis present

## 2019-02-20 DIAGNOSIS — Z6841 Body Mass Index (BMI) 40.0 and over, adult: Secondary | ICD-10-CM

## 2019-02-20 DIAGNOSIS — E876 Hypokalemia: Secondary | ICD-10-CM | POA: Diagnosis present

## 2019-02-20 DIAGNOSIS — E1165 Type 2 diabetes mellitus with hyperglycemia: Principal | ICD-10-CM | POA: Diagnosis present

## 2019-02-20 DIAGNOSIS — R109 Unspecified abdominal pain: Secondary | ICD-10-CM

## 2019-02-20 DIAGNOSIS — I130 Hypertensive heart and chronic kidney disease with heart failure and stage 1 through stage 4 chronic kidney disease, or unspecified chronic kidney disease: Secondary | ICD-10-CM | POA: Diagnosis present

## 2019-02-20 LAB — URINALYSIS, ROUTINE W REFLEX MICROSCOPIC

## 2019-02-20 LAB — BASIC METABOLIC PANEL
Anion gap: 19 — ABNORMAL HIGH (ref 5–15)
Anion gap: 7 (ref 5–15)
BUN: 20 mg/dL (ref 6–20)
BUN: 14 mg/dL (ref 6–20)
CO2: 17 mmol/L — ABNORMAL LOW (ref 22–32)
CO2: 13 mmol/L — ABNORMAL LOW (ref 22–32)
Calcium: 8.8 mg/dL — ABNORMAL LOW (ref 8.9–10.3)
Calcium: 5.2 mg/dL — CL (ref 8.9–10.3)
Chloride: 100 mmol/L (ref 98–111)
Chloride: 123 mmol/L — ABNORMAL HIGH (ref 98–111)
Creatinine, Ser: 1.67 mg/dL — ABNORMAL HIGH (ref 0.44–1.00)
Creatinine, Ser: 0.86 mg/dL (ref 0.44–1.00)
GFR calc Af Amer: 40 mL/min — ABNORMAL LOW (ref >60)
GFR calc Af Amer: >60 mL/min (ref >60)
GFR calc non Af Amer: 34 mL/min — ABNORMAL LOW (ref >60)
GFR calc non Af Amer: >60 mL/min (ref >60)
Glucose, Bld: 537 mg/dL (ref 70–99)
Glucose, Bld: 153 mg/dL — ABNORMAL HIGH (ref 70–99)
Potassium: 3.9 mmol/L (ref 3.5–5.1)
Potassium: 2.6 mmol/L — CL (ref 3.5–5.1)
Sodium: 136 mmol/L (ref 135–145)
Sodium: 143 mmol/L (ref 135–145)

## 2019-02-20 LAB — HEPATIC FUNCTION PANEL
ALT: 18 U/L (ref 0–44)
AST: 16 U/L (ref 15–41)
Albumin: 3.2 g/dL — ABNORMAL LOW (ref 3.5–5.0)
Alkaline Phosphatase: 99 U/L (ref 38–126)
Bilirubin, Direct: 0.1 mg/dL (ref 0.0–0.2)
Indirect Bilirubin: 0.5 mg/dL (ref 0.3–0.9)
Total Bilirubin: 0.6 mg/dL (ref 0.3–1.2)
Total Protein: 7.8 g/dL (ref 6.5–8.1)

## 2019-02-20 LAB — CBG MONITORING, ED
Glucose-Capillary: 441 mg/dL — ABNORMAL HIGH (ref 70–99)
Glucose-Capillary: 469 mg/dL — ABNORMAL HIGH (ref 70–99)
Glucose-Capillary: 329 mg/dL — ABNORMAL HIGH (ref 70–99)
Glucose-Capillary: 225 mg/dL — ABNORMAL HIGH (ref 70–99)
Glucose-Capillary: 229 mg/dL — ABNORMAL HIGH (ref 70–99)
Glucose-Capillary: 205 mg/dL — ABNORMAL HIGH (ref 70–99)
Glucose-Capillary: 208 mg/dL — ABNORMAL HIGH (ref 70–99)

## 2019-02-20 LAB — CBC
HCT: 35.5 % — ABNORMAL LOW (ref 36.0–46.0)
Hemoglobin: 10.9 g/dL — ABNORMAL LOW (ref 12.0–15.0)
MCH: 27.9 pg (ref 26.0–34.0)
MCHC: 30.7 g/dL (ref 30.0–36.0)
MCV: 91 fL (ref 80.0–100.0)
Platelets: 419 10*3/uL — ABNORMAL HIGH (ref 150–400)
RBC: 3.9 MIL/uL (ref 3.87–5.11)
RDW: 14.1 % (ref 11.5–15.5)
WBC: 14.5 10*3/uL — ABNORMAL HIGH (ref 4.0–10.5)
nRBC: 0 % (ref 0.0–0.2)

## 2019-02-20 LAB — I-STAT BETA HCG BLOOD, ED (MC, WL, AP ONLY): I-stat hCG, quantitative: <5 m[IU]/mL (ref <5)

## 2019-02-20 LAB — URINALYSIS, MICROSCOPIC (REFLEX)
Bacteria, UA: NONE SEEN
RBC / HPF: >50 RBC/hpf (ref 0–5)
Squamous Epithelial / HPF: NONE SEEN (ref 0–5)

## 2019-02-20 LAB — POCT I-STAT 7, (LYTES, BLD GAS, ICA,H+H)
Acid-base deficit: 4 mmol/L — ABNORMAL HIGH (ref 0.0–2.0)
Bicarbonate: 20.3 mmol/L (ref 20.0–28.0)
Calcium, Ion: 1.21 mmol/L (ref 1.15–1.40)
HCT: 33 % — ABNORMAL LOW (ref 36.0–46.0)
Hemoglobin: 11.2 g/dL — ABNORMAL LOW (ref 12.0–15.0)
O2 Saturation: 90 %
Patient temperature: 98
Potassium: 3.5 mmol/L (ref 3.5–5.1)
Sodium: 140 mmol/L (ref 135–145)
TCO2: 21 mmol/L — ABNORMAL LOW (ref 22–32)
pCO2 arterial: 35.1 mmHg (ref 32.0–48.0)
pH, Arterial: 7.369 (ref 7.350–7.450)
pO2, Arterial: 59 mmHg — ABNORMAL LOW (ref 83.0–108.0)

## 2019-02-20 LAB — TROPONIN I (HIGH SENSITIVITY)
Troponin I (High Sensitivity): 29 ng/L — ABNORMAL HIGH (ref <18)
Troponin I (High Sensitivity): 37 ng/L — ABNORMAL HIGH (ref <18)
Troponin I (High Sensitivity): 71 ng/L — ABNORMAL HIGH (ref <18)

## 2019-02-20 LAB — BETA-HYDROXYBUTYRIC ACID: Beta-Hydroxybutyric Acid: 0.8 mmol/L — ABNORMAL HIGH (ref 0.05–0.27)

## 2019-02-20 LAB — HEMOGLOBIN A1C
Hgb A1c MFr Bld: 9.6 % — ABNORMAL HIGH (ref 4.8–5.6)
Mean Plasma Glucose: 228.82 mg/dL

## 2019-02-20 LAB — SARS CORONAVIRUS 2 (TAT 6-24 HRS): SARS Coronavirus 2: NEGATIVE

## 2019-02-20 LAB — LIPASE, BLOOD: Lipase: 24 U/L (ref 11–51)

## 2019-02-20 MED ORDER — ONDANSETRON HCL 4 MG/2ML IJ SOLN
4.0000 mg | Freq: Once | INTRAMUSCULAR | Status: AC
  Administered 2019-02-20: 4 mg via INTRAVENOUS
  Filled 2019-02-20: qty 2

## 2019-02-20 MED ORDER — POTASSIUM CHLORIDE CRYS ER 20 MEQ PO TBCR
40.0000 meq | EXTENDED_RELEASE_TABLET | Freq: Two times a day (BID) | ORAL | Status: DC
  Administered 2019-02-20: 40 meq via ORAL
  Filled 2019-02-20: qty 2

## 2019-02-20 MED ORDER — SODIUM CHLORIDE 0.9 % IV BOLUS
1000.0000 mL | Freq: Once | INTRAVENOUS | Status: AC
  Administered 2019-02-20: 1000 mL via INTRAVENOUS

## 2019-02-20 MED ORDER — HYDRALAZINE HCL 20 MG/ML IJ SOLN
10.0000 mg | Freq: Once | INTRAMUSCULAR | Status: AC
  Administered 2019-02-20: 10 mg via INTRAVENOUS
  Filled 2019-02-20: qty 1

## 2019-02-20 MED ORDER — FENTANYL CITRATE (PF) 100 MCG/2ML IJ SOLN
50.0000 ug | Freq: Once | INTRAMUSCULAR | Status: AC
  Administered 2019-02-20: 50 ug via INTRAVENOUS
  Filled 2019-02-20: qty 2

## 2019-02-20 MED ORDER — INSULIN GLARGINE 100 UNIT/ML ~~LOC~~ SOLN
50.0000 [IU] | Freq: Every day | SUBCUTANEOUS | Status: DC
  Administered 2019-02-20 – 2019-02-22 (×3): 50 [IU] via SUBCUTANEOUS
  Filled 2019-02-20 (×4): qty 0.5

## 2019-02-20 MED ORDER — SODIUM CHLORIDE 0.9% FLUSH: 10.0000 mL | INTRAVENOUS | Status: DC | PRN

## 2019-02-20 MED ORDER — SODIUM CHLORIDE 0.9% FLUSH
10.0000 mL | Freq: Two times a day (BID) | INTRAVENOUS | Status: DC
  Administered 2019-02-21 – 2019-02-23 (×4): 10 mL

## 2019-02-20 MED ORDER — ENOXAPARIN SODIUM 60 MG/0.6ML ~~LOC~~ SOLN
60.0000 mg | SUBCUTANEOUS | Status: DC
  Filled 2019-02-20: qty 0.6

## 2019-02-20 MED ORDER — INSULIN GLARGINE 100 UNIT/ML ~~LOC~~ SOLN
30.0000 [IU] | Freq: Every day | SUBCUTANEOUS | Status: DC
  Filled 2019-02-20: qty 0.3

## 2019-02-20 MED ORDER — DEXTROSE 50 % IV SOLN: 0.0000 mL | INTRAVENOUS | Status: DC | PRN

## 2019-02-20 MED ORDER — INSULIN DETEMIR 100 UNIT/ML ~~LOC~~ SOLN
31.0000 [IU] | Freq: Two times a day (BID) | SUBCUTANEOUS | Status: DC
  Filled 2019-02-20: qty 0.31

## 2019-02-20 MED ORDER — ONDANSETRON 4 MG PO TBDP
4.0000 mg | ORAL_TABLET | Freq: Once | ORAL | Status: AC | PRN
  Administered 2019-02-20: 4 mg via ORAL
  Filled 2019-02-20: qty 1

## 2019-02-20 MED ORDER — SODIUM CHLORIDE 0.9 % IV SOLN: INTRAVENOUS | Status: DC

## 2019-02-20 MED ORDER — INSULIN ASPART 100 UNIT/ML ~~LOC~~ SOLN
5.0000 [IU] | Freq: Once | SUBCUTANEOUS | Status: AC
  Administered 2019-02-20: 5 [IU] via SUBCUTANEOUS

## 2019-02-20 MED ORDER — NITROGLYCERIN 0.4 MG SL SUBL: 0.4000 mg | SUBLINGUAL_TABLET | SUBLINGUAL | Status: DC | PRN

## 2019-02-20 MED ORDER — INSULIN REGULAR(HUMAN) IN NACL 100-0.9 UT/100ML-% IV SOLN
INTRAVENOUS | Status: AC
  Administered 2019-02-20: 13 [IU]/h via INTRAVENOUS
  Filled 2019-02-20: qty 100

## 2019-02-20 MED ORDER — POTASSIUM CHLORIDE 10 MEQ/100ML IV SOLN
10.0000 meq | INTRAVENOUS | Status: AC
  Administered 2019-02-20 (×2): 10 meq via INTRAVENOUS
  Filled 2019-02-20 (×2): qty 100

## 2019-02-20 MED ORDER — INSULIN ASPART 100 UNIT/ML ~~LOC~~ SOLN: 2.0000 [IU] | SUBCUTANEOUS | Status: DC

## 2019-02-20 MED ORDER — DEXTROSE-NACL 5-0.45 % IV SOLN: INTRAVENOUS | Status: DC

## 2019-02-20 MED ORDER — ENOXAPARIN SODIUM 40 MG/0.4ML ~~LOC~~ SOLN
40.0000 mg | SUBCUTANEOUS | Status: DC
  Administered 2019-02-21 – 2019-02-22 (×2): 40 mg via SUBCUTANEOUS
  Filled 2019-02-20 (×2): qty 0.4

## 2019-02-20 NOTE — Progress Notes (Addendum)
Inpatient Diabetes Program Recommendations  AACE/ADA: New Consensus Statement on Inpatient Glycemic Control (2015)  Target Ranges:  Prepandial:   less than 140 mg/dL      Peak postprandial:   less than 180 mg/dL (1-2 hours)      Critically ill patients:  140 - 180 mg/dL   Lab Results  Component Value Date   GLUCAP 441 (H) 02/20/2019   HGBA1C 9.5 (H) 12/13/2018     Diabetes history: DM2 Outpatient Diabetes medications: Novolog 30 units 4 x a day AC and HS; Lantus 50 units HS Current orders for Inpatient glycemic control: None  Inpatient Diabetes Program Recommendations:     -Please consider starting IV insulin-hyperglycemia crisis-DKA  Attempted to call patient about A1c of 9.5%; no answer; will try again tomorrow.  Thank you, Geoffry Paradise, RN, BSN Diabetes Coordinator Inpatient Diabetes Program [phone] (team pager from 8a-5p)

## 2019-02-20 NOTE — Progress Notes (Signed)
RN notified of mixed venous ABG.

## 2019-02-20 NOTE — ED Notes (Signed)
Pt requested something for nausea 

## 2019-02-20 NOTE — ED Notes (Signed)
Called pt from lobby x1 No answer

## 2019-02-20 NOTE — ED Provider Notes (Addendum)
[facility] EMERGENCY DEPARTMENT Provider Note   [name]: [id number] Arrival date & time: 02/20/19  8315     History Chief Complaint  Patient presents with  . Chest Pain    Deborah [name] is a 53 y.o. female.  Level 5 caveat for acuity of condition.  Chief complaint chest pain and abdominal pain with associated vomiting starting yesterday at 1700.  Patient allegedly initially went to [facility], got tired of waiting, came to [facility].  Patient has multiple chronic health problems including obesity, diabetes, fibromyalgia, hypertension, stroke, several others.  Symptoms are not associate with activity.  Severity is moderate.        Past Medical History:  Diagnosis Date  . Acute back pain with sciatica, left   . Acute back pain with sciatica, right   . Anemia, unspecified   . Chest pain 12/2015  . Chronic pain   . Diabetes mellitus   . Esophageal reflux   . Fibromyalgia   . Gastric ulcer   . Gastroparesis   . Gout   . Hyperlipidemia   . Hypertension   . Lumbosacral stenosis   . Obesity   . Pneumonia   . [name]   . Stroke ([place]) 02/2011  . Vitamin B12 deficiency anemia     Patient Active Problem List   Diagnosis Date Noted  . Congestion of nasal sinus 01/24/2019  . Chronic diastolic heart failure ([place]) 12/19/2018  . Intractable nausea and vomiting 12/17/2018  . Hypoxia 12/17/2018  . Hyperglycemia 12/17/2018  . Elevated troponin I level   . Vomiting 07/18/2018  . Abdominal pain 07/17/2018  . Hyperkalemia 07/17/2018  . Cardiac arrest ([place]) 11/29/2017  . Pelvic mass 11/29/2017  . Leukocytosis 11/29/2017  . Anxiety 11/29/2017  . Allergic reaction to contrast media, initial encounter 11/28/2017  . Palliative care encounter   . Back pain 03/19/2017  . Stroke (cerebrum) ([place]) 03/19/2017  . GERD (gastroesophageal reflux disease) 03/19/2017  . Depression 03/19/2017  . Obesity   . Urinary tract infection 08/16/2016  . Normocytic  normochromic anemia 08/16/2016  . Gastroparesis 08/16/2016  . Intractable vomiting with nausea 06/17/2016  . Diabetic gastroparesis ([place]) 06/05/2016  . Gout 06/05/2016  . AKI (acute kidney injury) ([place]) 12/06/2015  . Chest pain 09/26/2015  . Hypokalemia 09/26/2015  . Hypomagnesemia 09/26/2015  . Nausea and vomiting 08/20/2015  . Gout flare 05/27/2015  . Lower abdominal pain 05/26/2015  . DKA (diabetic ketoacidoses) ([place]) 05/25/2015  . Uncontrolled type 2 diabetes mellitus with diabetic neuropathy, with long-term current use of insulin ([place]) 05/25/2015  . Type 2 diabetes mellitus with hyperglycemia, with long-term current use of insulin ([place]) 05/25/2015  . CKD (chronic kidney disease), stage II 05/25/2015  . Essential hypertension, benign 09/28/2013    Past Surgical History:  Procedure Laterality Date  . CATARACT EXTRACTION  01/2014  . CHOLECYSTECTOMY       OB History   No obstetric history on file.     Family History  Problem Relation Age of Onset  . Diabetes Mother   . Diabetes Father   . Heart disease Father   . Diabetes Sister   . Congestive Heart Failure Sister 82  . Diabetes Brother     Social History   Tobacco Use  . Smoking status: Never Smoker  . Smokeless tobacco: Never Used  Substance Use Topics  . Alcohol use: No  . Drug use: No    Home Medications Prior to Admission medications   Medication Sig Start  Date End Date Taking? Authorizing Provider  allopurinol (ZYLOPRIM) 100 MG tablet Take 1 tablet (100 mg total) by mouth daily. 09/23/15   Debbe Odea, MD  atorvastatin (LIPITOR) 10 MG tablet Take 1 tablet (10 mg total) by mouth daily. 12/13/18   [name], DO  azelastine (ASTELIN) 0.1 % nasal spray Place 2 sprays into both nostrils 2 (two) times daily. Use in each nostril as directed 01/23/19   [name], DO  blood glucose meter kit and supplies KIT Dispense based on patient and insurance preference. Use up to four times daily as directed.  (FOR ICD-9 250.00, 250.01). 12/13/18   [name], DO  cetirizine (ZYRTEC) 10 MG tablet Take 1 tablet (10 mg total) by mouth daily. 12/13/18   Lockamy, Christia Reading, DO  Dulaglutide 0.75 MG/0.5ML SOPN Inject 0.75 mg into the skin once a week. Then inject 1.86m into the skin once a week starting with the second week. 02/16/19   LNuala Alpha DO  DULoxetine HCl 40 MG CPEP Take 40 mg by mouth 2 (two) times daily. 02/16/19 03/18/19  LNuala Alpha DO  fluticasone (FLONASE) 50 MCG/ACT nasal spray Place 2 sprays into both nostrils daily as needed for allergies or rhinitis. 12/19/18   Rai, RVernelle Emerald MD  furosemide (LASIX) 20 MG tablet Take 1 tablet (20 mg total) by mouth daily. Hold for 2 days 01/29/19   AMilus BanisterC, DO  hydrALAZINE (APRESOLINE) 25 MG tablet Take 1 tablet (25 mg total) by mouth 3 (three) times daily. For blood pressure 01/29/19   AMilus BanisterC, DO  hydrOXYzine (ATARAX/VISTARIL) 10 MG tablet Take 1 tablet (10 mg total) by mouth 3 (three) times daily as needed. Patient taking differently: Take 10 mg by mouth 3 (three) times daily as needed for itching.  12/13/18   Lockamy, TChristia Reading DO  insulin aspart (NOVOLOG) 100 UNIT/ML injection Inject 30 Units into the skin 4 (four) times daily -  with meals and at bedtime. 01/23/19   Lockamy, TChristia Reading DO  LANTUS SOLOSTAR 100 UNIT/ML Solostar Pen Inject 50 Units into the skin at bedtime. 01/18/19   [provider]  loperamide (IMODIUM) 2 MG capsule Take 2 capsules (4 mg total) by mouth as needed for diarrhea or loose stools. 01/29/19   ADaisy Floro DO  Melatonin 3 MG TABS Take 2 tablets (6 mg total) by mouth at bedtime. 12/13/18   LNuala Alpha DO  methocarbamol (ROBAXIN) 500 MG tablet Take 1 tablet (500 mg total) by mouth 2 (two) times daily as needed for muscle spasms. 02/06/19   ACaroline More DO  metoCLOPramide (REGLAN) 10 MG tablet Take 1 tablet (10 mg total) by mouth 3 (three) times daily before meals. 12/19/18    Rai, RVernelle Emerald MD  metoprolol succinate (TOPROL-XL) 25 MG 24 hr tablet Take 1 tablet (25 mg total) by mouth daily. 12/13/18   LNuala Alpha DO  nitroGLYCERIN (NITROSTAT) 0.4 MG SL tablet Place 1 tablet (0.4 mg total) under the tongue every 5 (five) minutes as needed for chest pain. 12/13/18   Lockamy, Timothy, DO  ondansetron (ZOFRAN ODT) 4 MG disintegrating tablet Take 1 tablet (4 mg total) by mouth every 8 (eight) hours as needed for nausea or vomiting. 12/19/18   Rai, Ripudeep K, MD  pantoprazole (PROTONIX) 40 MG tablet Take 1 tablet (40 mg total) by mouth 2 (two) times daily. 12/13/18   LNuala Alpha DO    Allergies    Contrast media [iodinated diagnostic agents], Diazepam, Isovue [iopamidol], Lisinopril, Penicillins, Acetaminophen, Tolmetin, Aspirin,  Food, Nsaids, and Tramadol  Review of Systems   Review of Systems  Unable to perform ROS: Acuity of condition    Physical Exam Updated Vital Signs BP (!) 166/94   Pulse (!) 127   Temp 97.9 F (36.6 C) (Oral)   Resp 18   LMP 10/10/2012   SpO2 97%   Physical Exam Vitals and nursing note reviewed.  Constitutional:      Comments: Elevated bmi  HENT:     Head: Normocephalic and atraumatic.  Eyes:     Conjunctiva/sclera: Conjunctivae normal.  Cardiovascular:     Rate and Rhythm: Normal rate and regular rhythm.  Pulmonary:     Effort: Pulmonary effort is normal.     Breath sounds: Normal breath sounds.  Abdominal:     General: Bowel sounds are normal.     Palpations: Abdomen is soft.  Musculoskeletal:        General: Normal range of motion.     Cervical back: Neck supple.  Skin:    General: Skin is warm and dry.  Neurological:     General: No focal deficit present.     Mental Status: She is alert and oriented to person, place, and time.  Psychiatric:        Behavior: Behavior normal.     ED Results / Procedures / Treatments   Labs (all labs ordered are listed, but only abnormal results are displayed) Labs  Reviewed  BASIC METABOLIC PANEL - Abnormal; Notable for the following components:      Result Value   CO2 17 (*)    Glucose, Bld 537 (*)    Creatinine, Ser 1.67 (*)    Calcium 8.8 (*)    GFR calc non Af Amer 34 (*)    GFR calc Af Amer 40 (*)    Anion gap 19 (*)    All other components within normal limits  CBC - Abnormal; Notable for the following components:   WBC 14.5 (*)    Hemoglobin 10.9 (*)    HCT 35.5 (*)    Platelets 419 (*)    All other components within normal limits  TROPONIN I (HIGH SENSITIVITY) - Abnormal; Notable for the following components:   Troponin I (High Sensitivity) 29 (*)    All other components within normal limits  HEPATIC FUNCTION PANEL  LIPASE, BLOOD  I-STAT BETA HCG BLOOD, ED ([facility], [facility], [facility] ONLY)  TROPONIN I (HIGH SENSITIVITY)    EKG None  Radiology DG Chest 2 View  Result Date: 02/20/2019 CLINICAL DATA:  Chest pain EXAM: CHEST - 2 VIEW COMPARISON:  Yesterday FINDINGS: Mild cardiomegaly accentuated by low volumes. There is no edema, consolidation, effusion, or pneumothorax. No acute osseous finding IMPRESSION: Stable low volume chest.  No evidence of active disease. Mild cardiomegaly. Electronically Signed   By: Monte Fantasia M.D.   On: 02/20/2019 06:37   DG Chest 2 View  Result Date: 02/19/2019 CLINICAL DATA:  Chest pain EXAM: CHEST - 2 VIEW COMPARISON:  January 26, 2019 FINDINGS: Lungs are clear. Heart is borderline enlarged with pulmonary vascularity normal. No adenopathy. No pneumothorax. No bone lesions. IMPRESSION: Borderline cardiac enlargement.  No edema or consolidation. Electronically Signed   By: Lowella Grip III M.D.   On: 02/19/2019 20:01    Procedures Procedures (including critical care time)  Medications Ordered in ED Medications  ondansetron (ZOFRAN-ODT) disintegrating tablet 4 mg (4 mg Oral Given 02/20/19 0626)  sodium chloride 0.9 % bolus 1,000 mL (1,000 mLs Intravenous New Bag/Given  02/20/19 1300)  ondansetron  (ZOFRAN) injection 4 mg (4 mg Intravenous Given 02/20/19 1259)  fentaNYL (SUBLIMAZE) injection 50 mcg (50 mcg Intravenous Given 02/20/19 1259)    ED Course  I have reviewed the triage vital signs and the nursing notes.  Pertinent labs & imaging results that were available during my care of the patient were reviewed by me and considered in my medical decision making (see chart for details).    MDM Rules/Calculators/A&P                      Uncertain etiology of patient's chest pain and abdominal pain.  Glucose is elevated.  Will hydrate, check basic labs.   1345: Recheck.  Still tachycardic, but feeling better.  Complains of left lower quadrant pain.  Glucose elevated.  We will continue to hydrate.  1400: IV insulin ordered via order set.  1445:  labs and vital signs reviewed.  Patient still tachycardic.  Suspect secondary to dehydration.  If pulses do not improve with hydration, will consider pulmonary embolism.  Discussed with family practice resident.  CRITICAL CARE Performed by: Nat Christen Total critical care time: 35 minutes Critical care time was exclusive of separately billable procedures and treating other patients. Critical care was necessary to treat or prevent imminent or life-threatening deterioration. Critical care was time spent personally by me on the following activities: development of treatment plan with patient and/or surrogate as well as nursing, discussions with consultants, evaluation of patient's response to treatment, examination of patient, obtaining history from patient or surrogate, ordering and performing treatments and interventions, ordering and review of laboratory studies, ordering and review of radiographic studies, pulse oximetry and re-evaluation of patient's condition. Final Clinical Impression(s) / ED Diagnoses Final diagnoses:  Chest pain, unspecified type  Abdominal pain, unspecified abdominal location    Rx / DC Orders ED Discharge Orders     None       Nat Christen, MD 02/20/19 1354    Nat Christen, MD 02/20/19 1404    Nat Christen, MD 02/20/19 1451

## 2019-02-20 NOTE — ED Notes (Signed)
Pt is sinus tach on monitor 

## 2019-02-20 NOTE — ED Triage Notes (Signed)
Pt c/o CP. Pt was seen at [facility] for same but got tired of waiting and left.

## 2019-02-20 NOTE — ED Notes (Addendum)
Pt refused to be stuck for blood. Pt asked if blood could be drawn from the IVs.

## 2019-02-20 NOTE — Progress Notes (Addendum)
Family medicine progress note  Reviewed 7 PM BMP.  K2.6. Calcium 5.2.   Reviewed potassium repletion throughout the day patient appears to have only gotten 10 mEq KCl x2 since admit.  Likely got behind due to insulin drip.  Most recent BMP with anion gap of 7.  Will give K-Dur 40 mEq, recheck BMP at midnight and replete potassium as necessary.  If still low at midnight check will check regularly scheduled 5 AM BMP and hopefully catch back up at that point.  Will add on magnesium check as well.  Unclear why calcium is so low. Last check was 8.8. Will add on ionized calcium to midnight labs, replete as necessary.  Patient receiving 6.5U/hour at time of DC.  Per Endo tool order set patient to be started on Levemir 31 units twice daily.  Patient on home Lantus 50 units daily, so will opt to restart this dose of lantus instead.  Will adjust as needed. Put in for sensitive sliding scale as well.  Patient to have drip discontinued 2 hours after receiving Lantus.  Given the patient is now off the insulin drip, will advance diet to carb modified.  We will plan to stop IV fluids with insulin drip.  Guadalupe Dawn MD PGY-3 Family Medicine Resident

## 2019-02-20 NOTE — H&P (Addendum)
[facility] Admission History and Physical Service Pager: [phone]  Patient name: Deborah [name] Medical record number: [id number] Date of birth: 14-Aug-1965 Age: 53 y.o. Gender: female  Primary Care Provider: [name], DO Consultants: None Code Status: FULL  Preferred Emergency Contact: Mother, Meera Vasco [phone]  Chief Complaint: Abdominal pain and chest pain, vomiting  Assessment and Plan: Deborah [name] is a 53 y.o. female presenting with chest pain and abdominal pain. PMH is significant for Type 2 Diabetes, Gastroparesis, Hyperlipidemia, Hypertension, Gout, Anxiety, Insomnia, Fibromyalgia and Chronic pain  Intractable nausea and vomiting Pt presents with 1 day hx of lower abdominal pain, chest pain, NBNB vomiting and diarrhea.  On exam, patient tachycardic to 120s, afebrile, stable O2 sats, RR 18, BP elevated to 200/160s.  Abdomen diffusely mildly tender to palpation, specifically in lower abdominal region bilaterally.  Also reports epigastric tenderness and tenderness palpation of her chest.  Pt feels her symptoms are secondary to gastroparesis which she has frequently and presented on 12/16/18 with similar symptoms.  Differential could include hyperglycemia as cause of nausea and vomiting, although the patient reports compliance with her insulin when she is feeling well.  Conversely, hyperglycemia could also be caused by non-compliance 2/2 nausea and vomiting.  In addition to hyperglycemia considered viral/bacterial gastroenteritis as pt admits to eating chinese takeout the night before her symptoms occurred and pt has diarrhea. WBC 14.2 but denies fevers.  Pancreatitis THOUGH considered given patient's pain, but lipase within normal limits at 24.  No antibiotics indicated at this time.  Given that patient frequently presents with the symptoms, will treat as usual for hyperglycemia, abdominal pain with as needed medications, and as  needed antiemetic.  Will monitor for fever, if no improvement with treatment of hyperglycemia and fluid hydration, would consider CT abdomen and pelvis to rule out intra-abdominal process, but would not proceed with this yet at this time.  She did receive Zofran 4 mg IV in the ED, would be careful of administering further given her QTC of 470 on admission.  S/p 2 L NS boluses in ED and was started on insulin gtt in ED. patient also received fentanyl 50 mcg x 2 in the ED.  Previously patient has been on morphine 2 mg IV every 4 hours as needed, will wait for patient to call for pain meds before starting this.  Will treat her hyperglycemia as below. -Admit to FPTS progressive, attending Dr. Andria Frames -Vitals per floor protocol, pulse ox checks with vitals -N.p.o. while on insulin drip -Continue insulin drip started in ED -Normal saline transition at 75 cc/hr to D5 0.45% saline when CBGs under 250 -CBGs every hour - cardiac telemetry -BMP every 4 hourly while on insulin gtt -Electrolytes replace as needed - Lovenox for DVT PPx -Can add morphine 2 mg IV every 4 hours as needed for pain if patient requires  Hyperglycemia with mildly increased anion gap  Type 2 diabetes Patient reports usual compliance, but has frequent admissions with hyperglycemia, usually in the setting of intractable nausea and vomiting.  It seems that she does not take her insulin when she is not feeling well, likely contributing to her hyperglycemia today.  Likely differential is hyperglycemia with mildly elevated anion gap. Initial glucose 537 on BMP. Anion gap is 19, Beta hydroxybutyrate 0.8. pH on ABG is 7.36 indicating pt is not in DKA. UA could not be done given red urine.  Pt does not meet criteria for HHS as glucose not > 600, she  is also mentating appropriately on her examination and does not have any evidence of Kussmaul breathing.  Diabetes poorly controlled, last A1c 9.6 today.  Home meds: Lantus 50 units at night, recently  started dulaglutide on 12/11 0.75 mg for the first week, then increase to 1.5 mg -Hold home diabetic medications -Continue Insulin drip, plan per above  Diabetic gastroparesis Reports ongoing nausea and vomiting yesterday, also had recent diarrhea.  Patient has had many recent admissions for intractable nausea and vomiting that have been attributed to gastroparesis.  Per chart review, patient was seen by GI in 2017 at [facility], but was lost to follow up, did have EGD at that time that was unremarkable.  This seems to be very longstanding issue, could consider another GI referral as outpatient. Home meds: Ondansetron 4 mg Q8PRN, metoclopramide 10 mg TID, loperamide 4 mg PRN -Hold oral medications, converted to IV equivalent as necessary -would recommend use of zofran sparingly given QTc 470, monitor on tele  Chest pain, ACS rule out  Tachycardia  Currently has sharp, constant, central 10/10 chest pain. Non radiating. Pt feels it is related to her "gastroparesis flare up". She reports it usually responds to morphine.  High-sensitivity troponin 29>37.  Very low suspicion for ACS given chronicity of symptoms, reproduction to palpation, EKG without acute changes, and character of pain, that is very atypical in presentation.  Tachycardiac to 120s on exam, does appear that this is a chronic issue for the patient when she comes in for this particular complaint.  Could also be contributed to dehydration.  We will continue to monitor while patient is rehydrated and other problems are treated.  If she continues to be tachycardic, could consider problem such as PE. Home med: Nitroglycerin PRN -Continuous telemetry  -Trend troponins until flat -Monitor chest pain -Repeat EKG in AM -Sublingual nitroglycerin PRN   AKI on CKD Stage II likely 2/2 dehydration Cr 1.67 today, Baseline ~ 1.4-1.5.  In the setting of inability to tolerate PO with tachycardia, likely 2/2 dehydration. -Avoid nephrotoxic agents -IV  fluids per above -Am BMP   Hypertension BP today 852 systolic, no headaches or visual disturbances. No evidence of end organ damage Home meds: Metoprolol 25 mg once a day, Lasix 20 mg once a day, hydralazine 25 mg 3 times daily (patient has had anaphylaxis with ACE inhibitor so contraindicated) -Hold antihypertensives as n.p.o., restart as able -IV hydralazine 10 mg x1 -Continue to monitor BPs  Hyperlipidemia  Takes 63m Atorvastatin  -Hold statin as n.p.o  Anxiety Home meds: Duloxetine 30 mg, hydroxyzine 10 mg 3 times daily -Hold antianxiety medications as n.p.o.  Gout Denies any acute gout flares Home med: Allopurinol 100 mg daily, no current flares -Hold allopurinol as n.p.o.  Chronic pain, fibromyalgia Home meds: Duloxetine 474mBID  -Hold as n.p.o  Insomnia Melatonin in 3 mg nightly for sleep -Hold melatonin as n.p.o.  GERD Home med: Pantoprazole 4043mnce daily -Hold as n.p.o  Seasonal allergies Home meds: Cetirizine, Astelin spray, Flonase spray -Hold as n.p.o.  FEN/GI: N.p.o. while on insulin gtt Prophylaxis: Lovenox  Disposition: Admit to progressive, Home when medically stable  History of Present Illness:  Deborah [name] a 53 64o. female presenting with vomiting, abdominal pain, and hyperglycemia.  She presented to [facility] yesteday, but states that it took a long time to be seen, so she left. Yesterday started vomiting a lot but only able to quantify "a good bit."  Hasn't been able to eat her usual  diet but was able to eat some soup yesterday. Two days ago was her last real meal where she ate chinese takeout. Also has had some associated diarrhea 3-4 times since yesterday, denies melena or hematochezia. Has also had some chest pain that radiates from her abdomen. Morphine is usually very helpful. Rates pain 10/10 severity.  Pain is constant.  Denies radiation to shoulders or back. States it is her usual gastroperesis pain and "flare up". Also has some  pain is in the lower abdomen and describes the pain as sharp.   Denies fevers.Of note, has had multiple recent admissions with similar presentations.   Reports that she last took all of her medications yesterday.  States that when she is feeling well she never misses a dose of insulin. Pt denies COVID-19 contacts, change in taste or smell, fevers.  In the [facility] ED today, patient found to be hyperglycemic to 537.  She was placed on an insulin drip and D51/2NS.  She also received 2L total of NS boluses. She was given IV fentanyl 50 mcg X 2, IV ondansetron.   Review Of Systems: Per HPI with the following additions:   Review of Systems  Constitutional: Negative for fever and weight loss.  Eyes: Negative for blurred vision.  Respiratory: Positive for shortness of breath.   Cardiovascular: Positive for chest pain.  Gastrointestinal: Positive for abdominal pain, diarrhea and vomiting. Negative for blood in stool and melena.  Genitourinary: Negative for dysuria, frequency, hematuria and urgency.  Musculoskeletal: Negative for back pain.  Neurological: Negative for headaches.  Psychiatric/Behavioral: Negative for substance abuse.    Patient Active Problem List   Diagnosis Date Noted  . Congestion of nasal sinus 01/24/2019  . Chronic diastolic heart failure ([place]) 12/19/2018  . Intractable nausea and vomiting 12/17/2018  . Hypoxia 12/17/2018  . Hyperglycemia 12/17/2018  . Elevated troponin I level   . Vomiting 07/18/2018  . Abdominal pain 07/17/2018  . Hyperkalemia 07/17/2018  . Cardiac arrest ([place]) 11/29/2017  . Pelvic mass 11/29/2017  . Leukocytosis 11/29/2017  . Anxiety 11/29/2017  . Allergic reaction to contrast media, initial encounter 11/28/2017  . Palliative care encounter   . Back pain 03/19/2017  . Stroke (cerebrum) ([place]) 03/19/2017  . GERD (gastroesophageal reflux disease) 03/19/2017  . Depression 03/19/2017  . Obesity   . Urinary tract infection 08/16/2016  .  Normocytic normochromic anemia 08/16/2016  . Gastroparesis 08/16/2016  . Intractable vomiting with nausea 06/17/2016  . Diabetic gastroparesis ([place]) 06/05/2016  . Gout 06/05/2016  . AKI (acute kidney injury) ([place]) 12/06/2015  . Chest pain 09/26/2015  . Hypokalemia 09/26/2015  . Hypomagnesemia 09/26/2015  . Nausea and vomiting 08/20/2015  . Gout flare 05/27/2015  . Lower abdominal pain 05/26/2015  . DKA (diabetic ketoacidoses) ([place]) 05/25/2015  . Uncontrolled type 2 diabetes mellitus with diabetic neuropathy, with long-term current use of insulin ([place]) 05/25/2015  . Type 2 diabetes mellitus with hyperglycemia, with long-term current use of insulin ([place]) 05/25/2015  . CKD (chronic kidney disease), stage II 05/25/2015  . Essential hypertension, benign 09/28/2013    Past Medical History: Past Medical History:  Diagnosis Date  . Acute back pain with sciatica, left   . Acute back pain with sciatica, right   . Anemia, unspecified   . Chest pain 12/2015  . Chronic pain   . Diabetes mellitus   . Esophageal reflux   . Fibromyalgia   . Gastric ulcer   . Gastroparesis   . Gout   . Hyperlipidemia   .  Hypertension   . Lumbosacral stenosis   . Obesity   . Pneumonia   . [name]   . Stroke ([place]) 02/2011  . Vitamin B12 deficiency anemia     Past Surgical History: Past Surgical History:  Procedure Laterality Date  . CATARACT EXTRACTION  01/2014  . CHOLECYSTECTOMY      Social History: Lives at home by herself in [place] in an apartment.  No smoking, no drugs.  Uses a wheelchair at baseline.  Mother is nearby. Social History   Tobacco Use  . Smoking status: Never Smoker  . Smokeless tobacco: Never Used  Substance Use Topics  . Alcohol use: No  . Drug use: No   Additional social history: Lives alone, uses a wheelchair  Please also refer to relevant sections of EMR.  Family History: Family History  Problem Relation Age of Onset  . Diabetes Mother   .  Diabetes Father   . Heart disease Father   . Diabetes Sister   . Congestive Heart Failure Sister 56  . Diabetes Brother      Allergies and Medications: Allergies  Allergen Reactions  . Contrast Media [Iodinated Diagnostic Agents] Anaphylaxis    Cardiac arrest  . Diazepam [name]  . Isovue [Iopamidol] Anaphylaxis    Patient had seizure like activity and then code post 100 cc of isovue 300  . Lisinopril Anaphylaxis    Tongue and mouth swelling  . Penicillins Palpitations    Has patient had a PCN reaction causing immediate rash, facial/tongue/throat swelling, SOB or lightheadedness with hypotension: Yes, heart races Has patient had a PCN reaction causing severe rash involving mucus membranes or skin necrosis: No Has patient had a PCN reaction that required hospitalization: Yes  Has patient had a PCN reaction occurring within the last 10 years: No   . Acetaminophen Nausea Only and Other (See Comments)    Irritates stomach ulcer  Abdominal pain  . Tolmetin Nausea Only    Other reaction(s): Other (See Comments) ULCER  . Aspirin Other (See Comments)    Irritates stomach ulcer   . Food Hives and Swelling    Carrots, ketchup   . Nsaids Other (See Comments)    ULCER  . Tramadol Nausea And Vomiting   No current facility-administered medications on file prior to encounter.   Current Outpatient Medications on File Prior to Encounter  Medication Sig Dispense Refill  . allopurinol (ZYLOPRIM) 100 MG tablet Take 1 tablet (100 mg total) by mouth daily. 30 tablet 0  . atorvastatin (LIPITOR) 10 MG tablet Take 1 tablet (10 mg total) by mouth daily. 30 tablet 11  . azelastine (ASTELIN) 0.1 % nasal spray Place 2 sprays into both nostrils 2 (two) times daily. Use in each nostril as directed 30 mL 12  . blood glucose meter kit and supplies KIT Dispense based on patient and insurance preference. Use up to four times daily as directed. (FOR ICD-9 250.00, 250.01). 1 each 0  . cetirizine  (ZYRTEC) 10 MG tablet Take 1 tablet (10 mg total) by mouth daily. 30 tablet 11  . Dulaglutide 0.75 MG/0.5ML SOPN Inject 0.75 mg into the skin once a week. Then inject 1.2m into the skin once a week starting with the second week. 4 pen 3  . DULoxetine HCl 40 MG CPEP Take 40 mg by mouth 2 (two) times daily. 60 capsule 2  . fluticasone (FLONASE) 50 MCG/ACT nasal spray Place 2 sprays into both nostrils daily as needed for allergies or rhinitis.  16 g 2  . furosemide (LASIX) 20 MG tablet Take 1 tablet (20 mg total) by mouth daily. Hold for 2 days    . hydrALAZINE (APRESOLINE) 25 MG tablet Take 1 tablet (25 mg total) by mouth 3 (three) times daily. For blood pressure 90 tablet 0  . hydrOXYzine (ATARAX/VISTARIL) 10 MG tablet Take 1 tablet (10 mg total) by mouth 3 (three) times daily as needed. (Patient taking differently: Take 10 mg by mouth 3 (three) times daily as needed for itching. ) 30 tablet 0  . insulin aspart (NOVOLOG) 100 UNIT/ML injection Inject 30 Units into the skin 4 (four) times daily -  with meals and at bedtime. 10 mL 11  . LANTUS SOLOSTAR 100 UNIT/ML Solostar Pen Inject 50 Units into the skin at bedtime.    [name] loperamide (IMODIUM) 2 MG capsule Take 2 capsules (4 mg total) by mouth as needed for diarrhea or loose stools. 30 capsule 0  . Melatonin 3 MG TABS Take 2 tablets (6 mg total) by mouth at bedtime. 30 tablet 6  . methocarbamol (ROBAXIN) 500 MG tablet Take 1 tablet (500 mg total) by mouth 2 (two) times daily as needed for muscle spasms. 30 tablet 0  . metoCLOPramide (REGLAN) 10 MG tablet Take 1 tablet (10 mg total) by mouth 3 (three) times daily before meals. 90 tablet 2  . metoprolol succinate (TOPROL-XL) 25 MG 24 hr tablet Take 1 tablet (25 mg total) by mouth daily. 30 tablet 2  . nitroGLYCERIN (NITROSTAT) 0.4 MG SL tablet Place 1 tablet (0.4 mg total) under the tongue every 5 (five) minutes as needed for chest pain. 30 tablet 1  . ondansetron (ZOFRAN ODT) 4 MG disintegrating tablet  Take 1 tablet (4 mg total) by mouth every 8 (eight) hours as needed for nausea or vomiting. 20 tablet 0  . pantoprazole (PROTONIX) 40 MG tablet Take 1 tablet (40 mg total) by mouth 2 (two) times daily. 60 tablet 1    Objective: BP (!) 172/90   Pulse (!) 130   Temp 97.9 F (36.6 C) (Oral)   Resp (!) 29   LMP 10/10/2012   SpO2 95%    Exam: General: Unwell and tired appearing, cooperative, pleasant Eyes: Normal extraocular eye movements, no scleral icterus ENTM: Nasal discharge, no pharyngeal edema or exudates, poor dentition Neck: Supple, normal ROM, no masses, thyromegaly, no lymphadenopathy Cardiovascular: S1-S2 present, RRR, tachycardia, normal cap refill Respiratory: CTAB, no crackles or wheeze, normal work of breathing Gastrointestinal: Abdomen nondistended, generalized tenderness, no guarding or rebound tenderness, bowel sounds present MSK: Moving all 4 limbs equally Derm: Skin warm and dry Neuro: Nerves grossly intact, A&Ox3 Psych: Normal mood, normal affect  Labs and Imaging: CBC BMET  Recent Labs  Lab 02/20/19 0620 02/20/19 1604  WBC 14.5*  --   HGB 10.9* 11.2*  HCT 35.5* 33.0*  PLT 419*  --    Recent Labs  Lab 02/20/19 0620 02/20/19 1604  NA 136 140  K 3.9 3.5  CL 100  --   CO2 17*  --   BUN 20  --   CREATININE 1.67*  --   GLUCOSE 537*  --   CALCIUM 8.8*  --      EKG:  Sinus tachycardia, no ischemic changes   DG Chest 2 View  Result Date: 02/20/2019 CLINICAL DATA:  Chest pain EXAM: CHEST - 2 VIEW COMPARISON:  Yesterday FINDINGS: Mild cardiomegaly accentuated by low volumes. There is no edema, consolidation, effusion, or pneumothorax. No acute osseous  finding IMPRESSION: Stable low volume chest.  No evidence of active disease. Mild cardiomegaly. Electronically Signed   By: Monte Fantasia M.D.   On: 02/20/2019 06:37   DG Chest 2 View  Result Date: 02/19/2019 CLINICAL DATA:  Chest pain EXAM: CHEST - 2 VIEW COMPARISON:  January 26, 2019 FINDINGS:  Lungs are clear. Heart is borderline enlarged with pulmonary vascularity normal. No adenopathy. No pneumothorax. No bone lesions. IMPRESSION: Borderline cardiac enlargement.  No edema or consolidation. Electronically Signed   By: Lowella Grip III M.D.   On: 02/19/2019 20:01    Lattie Haw, MD 02/20/2019, 4:26 PM PGY-1, [place] Intern pager: [phone], text pages welcome  FPTS Upper-Level Resident Addendum   I have independently interviewed and examined the patient. I have discussed the above with the original author and agree with their documentation. My edits for correction/addition/clarification are in green. Please see also any attending notes.   [place] Constable, D.O. PGY-2, [facility] 02/20/2019 6:53 PM  [place] Service pager: [phone] (text pages welcome through [place])

## 2019-02-21 ENCOUNTER — Other Ambulatory Visit: Payer: Self-pay

## 2019-02-21 DIAGNOSIS — I5032 Chronic diastolic (congestive) heart failure: Secondary | ICD-10-CM | POA: Diagnosis present

## 2019-02-21 DIAGNOSIS — E1165 Type 2 diabetes mellitus with hyperglycemia: Secondary | ICD-10-CM | POA: Diagnosis present

## 2019-02-21 DIAGNOSIS — K3184 Gastroparesis: Secondary | ICD-10-CM | POA: Diagnosis not present

## 2019-02-21 DIAGNOSIS — E876 Hypokalemia: Secondary | ICD-10-CM | POA: Diagnosis present

## 2019-02-21 DIAGNOSIS — Z6841 Body Mass Index (BMI) 40.0 and over, adult: Secondary | ICD-10-CM | POA: Diagnosis not present

## 2019-02-21 DIAGNOSIS — E1122 Type 2 diabetes mellitus with diabetic chronic kidney disease: Secondary | ICD-10-CM | POA: Diagnosis present

## 2019-02-21 DIAGNOSIS — D259 Leiomyoma of uterus, unspecified: Secondary | ICD-10-CM | POA: Diagnosis present

## 2019-02-21 DIAGNOSIS — N179 Acute kidney failure, unspecified: Secondary | ICD-10-CM | POA: Diagnosis present

## 2019-02-21 DIAGNOSIS — F419 Anxiety disorder, unspecified: Secondary | ICD-10-CM | POA: Diagnosis present

## 2019-02-21 DIAGNOSIS — E860 Dehydration: Secondary | ICD-10-CM | POA: Diagnosis present

## 2019-02-21 DIAGNOSIS — G8929 Other chronic pain: Secondary | ICD-10-CM | POA: Diagnosis present

## 2019-02-21 DIAGNOSIS — M797 Fibromyalgia: Secondary | ICD-10-CM | POA: Diagnosis present

## 2019-02-21 DIAGNOSIS — Z880 Allergy status to penicillin: Secondary | ICD-10-CM | POA: Diagnosis not present

## 2019-02-21 DIAGNOSIS — K219 Gastro-esophageal reflux disease without esophagitis: Secondary | ICD-10-CM | POA: Diagnosis present

## 2019-02-21 DIAGNOSIS — M109 Gout, unspecified: Secondary | ICD-10-CM | POA: Diagnosis present

## 2019-02-21 DIAGNOSIS — G4700 Insomnia, unspecified: Secondary | ICD-10-CM | POA: Diagnosis present

## 2019-02-21 DIAGNOSIS — Z885 Allergy status to narcotic agent status: Secondary | ICD-10-CM | POA: Diagnosis not present

## 2019-02-21 DIAGNOSIS — R112 Nausea with vomiting, unspecified: Secondary | ICD-10-CM | POA: Diagnosis not present

## 2019-02-21 DIAGNOSIS — I130 Hypertensive heart and chronic kidney disease with heart failure and stage 1 through stage 4 chronic kidney disease, or unspecified chronic kidney disease: Secondary | ICD-10-CM | POA: Diagnosis present

## 2019-02-21 DIAGNOSIS — R739 Hyperglycemia, unspecified: Secondary | ICD-10-CM | POA: Diagnosis present

## 2019-02-21 DIAGNOSIS — N182 Chronic kidney disease, stage 2 (mild): Secondary | ICD-10-CM | POA: Diagnosis present

## 2019-02-21 DIAGNOSIS — E785 Hyperlipidemia, unspecified: Secondary | ICD-10-CM | POA: Diagnosis present

## 2019-02-21 DIAGNOSIS — Z886 Allergy status to analgesic agent status: Secondary | ICD-10-CM | POA: Diagnosis not present

## 2019-02-21 DIAGNOSIS — Z888 Allergy status to other drugs, medicaments and biological substances status: Secondary | ICD-10-CM | POA: Diagnosis not present

## 2019-02-21 DIAGNOSIS — Z20828 Contact with and (suspected) exposure to other viral communicable diseases: Secondary | ICD-10-CM | POA: Diagnosis present

## 2019-02-21 LAB — BASIC METABOLIC PANEL
Anion gap: 9 (ref 5–15)
Anion gap: 12 (ref 5–15)
BUN: 18 mg/dL (ref 6–20)
BUN: 17 mg/dL (ref 6–20)
CO2: 23 mmol/L (ref 22–32)
CO2: 22 mmol/L (ref 22–32)
Calcium: 8.3 mg/dL — ABNORMAL LOW (ref 8.9–10.3)
Calcium: 8.2 mg/dL — ABNORMAL LOW (ref 8.9–10.3)
Chloride: 109 mmol/L (ref 98–111)
Chloride: 111 mmol/L (ref 98–111)
Creatinine, Ser: 1.47 mg/dL — ABNORMAL HIGH (ref 0.44–1.00)
Creatinine, Ser: 1.36 mg/dL — ABNORMAL HIGH (ref 0.44–1.00)
GFR calc Af Amer: 46 mL/min — ABNORMAL LOW (ref >60)
GFR calc Af Amer: 51 mL/min — ABNORMAL LOW (ref >60)
GFR calc non Af Amer: 40 mL/min — ABNORMAL LOW (ref >60)
GFR calc non Af Amer: 44 mL/min — ABNORMAL LOW (ref >60)
Glucose, Bld: 179 mg/dL — ABNORMAL HIGH (ref 70–99)
Glucose, Bld: 167 mg/dL — ABNORMAL HIGH (ref 70–99)
Potassium: 3.1 mmol/L — ABNORMAL LOW (ref 3.5–5.1)
Potassium: 3.5 mmol/L (ref 3.5–5.1)
Sodium: 141 mmol/L (ref 135–145)
Sodium: 145 mmol/L (ref 135–145)

## 2019-02-21 LAB — GLUCOSE, CAPILLARY
Glucose-Capillary: 152 mg/dL — ABNORMAL HIGH (ref 70–99)
Glucose-Capillary: 157 mg/dL — ABNORMAL HIGH (ref 70–99)
Glucose-Capillary: 194 mg/dL — ABNORMAL HIGH (ref 70–99)
Glucose-Capillary: 205 mg/dL — ABNORMAL HIGH (ref 70–99)
Glucose-Capillary: 298 mg/dL — ABNORMAL HIGH (ref 70–99)
Glucose-Capillary: 401 mg/dL — ABNORMAL HIGH (ref 70–99)

## 2019-02-21 LAB — MAGNESIUM
Magnesium: 1.2 mg/dL — ABNORMAL LOW (ref 1.7–2.4)
Magnesium: 1.8 mg/dL (ref 1.7–2.4)

## 2019-02-21 LAB — CBC
HCT: 28.9 % — ABNORMAL LOW (ref 36.0–46.0)
Hemoglobin: 8.9 g/dL — ABNORMAL LOW (ref 12.0–15.0)
MCH: 27.8 pg (ref 26.0–34.0)
MCHC: 30.8 g/dL (ref 30.0–36.0)
MCV: 90.3 fL (ref 80.0–100.0)
Platelets: 351 10*3/uL (ref 150–400)
RBC: 3.2 MIL/uL — ABNORMAL LOW (ref 3.87–5.11)
RDW: 14.3 % (ref 11.5–15.5)
WBC: 13.5 10*3/uL — ABNORMAL HIGH (ref 4.0–10.5)
nRBC: 0 % (ref 0.0–0.2)

## 2019-02-21 LAB — GLUCOSE, RANDOM: Glucose, Bld: 387 mg/dL — ABNORMAL HIGH (ref 70–99)

## 2019-02-21 LAB — TROPONIN I (HIGH SENSITIVITY)
Troponin I (High Sensitivity): 76 ng/L — ABNORMAL HIGH (ref <18)
Troponin I (High Sensitivity): 80 ng/L — ABNORMAL HIGH (ref <18)

## 2019-02-21 MED ORDER — POTASSIUM CHLORIDE CRYS ER 20 MEQ PO TBCR
40.0000 meq | EXTENDED_RELEASE_TABLET | Freq: Once | ORAL | Status: AC
  Administered 2019-02-21: 40 meq via ORAL
  Filled 2019-02-21: qty 2

## 2019-02-21 MED ORDER — PANTOPRAZOLE SODIUM 40 MG PO TBEC
40.0000 mg | DELAYED_RELEASE_TABLET | Freq: Two times a day (BID) | ORAL | Status: DC
  Administered 2019-02-21 – 2019-02-23 (×5): 40 mg via ORAL
  Filled 2019-02-21 (×2): qty 2
  Filled 2019-02-21: qty 1
  Filled 2019-02-21: qty 2
  Filled 2019-02-21: qty 1

## 2019-02-21 MED ORDER — ATORVASTATIN CALCIUM 10 MG PO TABS
10.0000 mg | ORAL_TABLET | Freq: Every day | ORAL | Status: DC
  Administered 2019-02-21 – 2019-02-22 (×2): 10 mg via ORAL
  Filled 2019-02-21 (×2): qty 1

## 2019-02-21 MED ORDER — INSULIN ASPART 100 UNIT/ML ~~LOC~~ SOLN
0.0000 [IU] | Freq: Three times a day (TID) | SUBCUTANEOUS | Status: DC
  Administered 2019-02-21: 11 [IU] via SUBCUTANEOUS
  Administered 2019-02-21: 20 [IU] via SUBCUTANEOUS
  Administered 2019-02-22: 11 [IU] via SUBCUTANEOUS
  Administered 2019-02-22: 4 [IU] via SUBCUTANEOUS
  Administered 2019-02-22: 3 [IU] via SUBCUTANEOUS
  Administered 2019-02-23 (×2): 4 [IU] via SUBCUTANEOUS

## 2019-02-21 MED ORDER — MAGNESIUM SULFATE 2 GM/50ML IV SOLN
2.0000 g | Freq: Once | INTRAVENOUS | Status: AC
  Administered 2019-02-21: 2 g via INTRAVENOUS
  Filled 2019-02-21: qty 50

## 2019-02-21 MED ORDER — METOCLOPRAMIDE HCL 5 MG/ML IJ SOLN
5.0000 mg | Freq: Once | INTRAMUSCULAR | Status: AC
  Administered 2019-02-21: 5 mg via INTRAVENOUS
  Filled 2019-02-21: qty 2

## 2019-02-21 MED ORDER — ONDANSETRON HCL 4 MG/2ML IJ SOLN
4.0000 mg | Freq: Once | INTRAMUSCULAR | Status: AC
  Administered 2019-02-21: 4 mg via INTRAVENOUS
  Filled 2019-02-21: qty 2

## 2019-02-21 MED ORDER — MORPHINE SULFATE (PF) 2 MG/ML IV SOLN
2.0000 mg | INTRAVENOUS | Status: DC | PRN
  Administered 2019-02-21 – 2019-02-22 (×6): 2 mg via INTRAVENOUS
  Filled 2019-02-21 (×6): qty 1

## 2019-02-21 MED ORDER — METOPROLOL TARTRATE 12.5 MG HALF TABLET
12.5000 mg | ORAL_TABLET | Freq: Two times a day (BID) | ORAL | Status: DC
  Administered 2019-02-21 – 2019-02-23 (×4): 12.5 mg via ORAL
  Filled 2019-02-21 (×4): qty 1

## 2019-02-21 MED ORDER — METOPROLOL SUCCINATE ER 25 MG PO TB24: 25.0000 mg | ORAL_TABLET | Freq: Every day | ORAL | Status: DC

## 2019-02-21 MED ORDER — METOPROLOL TARTRATE 12.5 MG HALF TABLET
12.5000 mg | ORAL_TABLET | Freq: Two times a day (BID) | ORAL | Status: DC
  Administered 2019-02-21: 12.5 mg via ORAL
  Filled 2019-02-21: qty 1

## 2019-02-21 NOTE — Progress Notes (Addendum)
FPTS Interim Progress Note  S: Asked to see patient as pt reports that she would like to speak to doctor regarding her abdominal pain. When I entered patient's room she was resting peacefully in her bed in the dark. After I said hello to her she started reporting severe abdominal pain 10/10 all over but worse in lower abdomen. She is requesting more morphine and would like more IV insulin as her sugars have got high >400 and she feels this will help her symptoms. I explained that she does not require an insulin drip and we are managing her blood glucose with high doses of insulin.  Also reports vomiting recurrently at least 3 times since I have saw her this morning and would like an antisickness medication.   O: BP 133/77   Pulse (!) 119   Temp 98.2 F (36.8 C) (Oral)   Resp 18   Wt (!) 151.5 kg   LMP 10/10/2012   SpO2 95%   BMI 53.93 kg/m     General: Alert and cooperative and appears to be in no acute distress Cardio: Warm and well perfused.  Pulm: Normal respiratory effort Abdomen: hypoactive bowel sounds, abdomen soft, generalized tenderness. No guarding or rebound tenderness. Neuro: Cranial nerves grossly intact  A/P: Overall no change in symptoms from this morning and likely 2/2 gastroparesis 1) Continue morphine 2mg  Q4PRN 2) Metoclopramide 5mg  IV once 3) Monitor pain and nausea, if worsening please page MD.  Lattie Haw, MD 02/21/2019, 3:05 PM PGY-1, [place] [facility] Service pager [phone]

## 2019-02-21 NOTE — Progress Notes (Signed)
Pt admitted from [facility] to 3E20. A&0 x4. No complaints of pain. ST on telemetry. No skin breakdown. Gen plan of care initiated. Moderate fall risk. Patient oriented to room and call system.

## 2019-02-21 NOTE — Plan of Care (Signed)

## 2019-02-21 NOTE — Progress Notes (Signed)
OT Cancellation Note  Patient Details Name: Deborah [name] MRN: [id number] DOB: 04-11-1965   Cancelled Treatment:    Reason Eval/Treat Not Completed: Pain limiting ability to participate. Attempted this morning at 10:20 to see pt. Pt in 10/10 Faces abdominal pain grimacing, softly crying at rest in bed. Pt reports recently receiving IV pain meds to little effect. Nursing notified.Plan to reattempt at a later time/date.   Tyrone Schimke, OT Acute Rehabilitation Services Pager: [phone] Office: [phone]  02/21/2019, 12:08 PM

## 2019-02-21 NOTE — Progress Notes (Addendum)
Pt has MEWS score of 2 for heart rate. It is not an acute change. Will continue to monitor.

## 2019-02-21 NOTE — Discharge Summary (Signed)
[facility] Discharge Summary  Patient name: Deborah [name] Medical record number: [id number] Date of birth: Jun 12, 1965 Age: 53 y.o. Gender: female Date of Admission: 02/20/2019  Date of Discharge: 12/18 Admitting Physician: Deborah Haw, MD  Primary Care Provider: [name], DO Consultants: GI   Indication for Hospitalization: Diabetic gastroparesis  Discharge Diagnoses/Problem List:  Type 2 Diabetes Morbid obesity Gastroparesis Hyperlipidemia Hypertension Gout Anxiety Insomnia Fibromyalgia Chronic pain  Disposition: home   Discharge Condition: Medically stable for discharge   Discharge Exam:  General: Alert, appears well, cooperative no acute distress Cardio: Normal S1 and S2, RRR. No murmurs or rubs.   Pulm: CTAB, no crackles, wheezing, or diminished breath sounds. Normal respiratory effort Abdomen: Bowel sounds normal. Abdomen soft and non-tender.  Extremities: No peripheral edema. Warm/ well perfused.  Strong radial pulse Neuro: Cranial nerves grossly intact  Brief Hospital Course:   Hyperglycemia with mildly increased anion gap, Type 2 Diabetes Deborah [name] is a 53 yr old female who was admitted on 12/15 for intractable vomiting vomiting, chest pain and lower abdominal pain and significant hyperglycemia. She has had many frequent admissions like this before Glucose was over 537 on admission mildly elevated anion gap.  Patient was not acidotic.  Patient was started on insulin drip and subsequently weaned to resistant sliding scale after gap closed. Glucose on discharge was 187.  Intractable nausea and vomiting QTc 470. Treated with ondansetron once and  metoclopramide TID with meals. Also Bentyl 85m Q12PRN for spasmodic abdominal pain.  AKI Cr 1.67 on admission likely 2/2 dehydration. Pt given IV fluids. Cr on discharge 1.40, at baseline  Chest pain Associated with diabetic gastroparesis. High-sensitivity troponin 29>37.  Very low suspicion for ACS given chronicity of symptoms and reproducible on palpation.  Tachycardia Likely 2/2 dehydration. Improved with IV fluids. HR 97 on discharge.   HTN Bps initially elevated during admission to 2578Isystolic. Pt was given metoprolol in hospital. Lasix initially held due to AKI. PRN IV Hydralazine given.   Hypokalemia, hypomagnesemia K 3.3 on discharge (repleted with 418m Kdur), Mg 1.8 on discharge. Will need repeat labs as outpatient to monitor.  Issues for Follow Up:  1. Refer to CCM for care coordination  2. Order BMP and Mag for follow up.  K was 3.3 at discharge, but had been repleted with K-Dur.  Mag was repleted during admission but was 1.8 on discharge. 3. Referred to GYN for fibroid (5.1 x 4.5 cm) in uterus on CT imaging 4. Referred to GI for recurrent gastroparesis episodes requiring hospitalization 5. Referred to cardiology for history of left ventricle hypokinesis 6. Discontinued dulaglutide, hydralazine on discharge.  Please monitor CBGs and blood pressure.   Significant Procedures:  Significant Labs and Imaging:  Recent Labs  Lab 02/21/19 0445 02/22/19 0500 02/23/19 0832  WBC 13.5* 14.5* 10.1  HGB 8.9* 9.0* 8.6*  HCT 28.9* 29.4* 28.5*  PLT 351 353 317   Recent Labs  Lab 02/20/19 1234 02/20/19 1952 02/21/19 0111 02/21/19 0445 02/21/19 1522 02/22/19 0500 02/23/19 0832 02/23/19 1140  NA  --  143 141 145  --  139 139  --   K  --  2.6* 3.1* 3.5  --  3.3* 3.3*  --   CL  --  123* 109 111  --  107 106  --   CO2  --  13* 23 22  --  23 25  --   GLUCOSE  --  153* 179* 167* 387* 152* 187*  --  BUN  --  '14 18 17  ' --  12 13  --   CREATININE  --  0.86 1.47* 1.36*  --  1.37* 1.40*  --   CALCIUM  --  5.2* 8.3* 8.2*  --  8.1* 8.3*  --   MG  --   --  1.2* 1.8  --  1.5*  --  1.8  ALKPHOS 99  --   --   --   --   --  79  --   AST 16  --   --   --   --   --  12*  --   ALT 18  --   --   --   --   --  13  --   ALBUMIN 3.2*  --   --   --   --   --   2.5*  --       Results/Tests Pending at Time of Discharge:   Discharge Medications:  Allergies as of 02/23/2019      Reactions   Contrast Media [iodinated Diagnostic Agents] Anaphylaxis   Cardiac arrest   Diazepam [name]   Isovue [iopamidol] Anaphylaxis   Patient had seizure like activity and then code post 100 cc of isovue 300   Lisinopril Anaphylaxis   Tongue and mouth swelling   Penicillins Palpitations   Has patient had a PCN reaction causing immediate rash, facial/tongue/throat swelling, SOB or lightheadedness with hypotension: Yes, heart races Has patient had a PCN reaction causing severe rash involving mucus membranes or skin necrosis: No Has patient had a PCN reaction that required hospitalization: Yes  Has patient had a PCN reaction occurring within the last 10 years: No   Acetaminophen Nausea Only, Other (See Comments)   Irritates stomach ulcer  Abdominal pain   Tolmetin Nausea Only   Other reaction(s): Other (See Comments) ULCER   Aspirin Other (See Comments)   Irritates stomach ulcer    Food Hives, Swelling   Carrots, ketchup    Nsaids Other (See Comments)   ULCER   Tramadol Nausea And Vomiting      Medication List    STOP taking these medications   Dulaglutide 0.75 MG/0.5ML Sopn   hydrALAZINE 25 MG tablet Commonly known as: APRESOLINE   methocarbamol 500 MG tablet Commonly known as: ROBAXIN     TAKE these medications   allopurinol 100 MG tablet Commonly known as: ZYLOPRIM Take 1 tablet (100 mg total) by mouth daily.   atorvastatin 10 MG tablet Commonly known as: LIPITOR Take 1 tablet (10 mg total) by mouth daily.   azelastine 0.1 % nasal spray Commonly known as: ASTELIN Place 2 sprays into both nostrils 2 (two) times daily. Use in each nostril as directed   blood glucose meter kit and supplies Kit Dispense based on patient and insurance preference. Use up to four times daily as directed. (FOR ICD-9 250.00, 250.01).    cetirizine 10 MG tablet Commonly known as: ZYRTEC Take 1 tablet (10 mg total) by mouth daily.   DULoxetine HCl 40 MG Cpep Take 40 mg by mouth 2 (two) times daily.   fluticasone 50 MCG/ACT nasal spray Commonly known as: FLONASE Place 2 sprays into both nostrils daily as needed for allergies or rhinitis.   furosemide 20 MG tablet Commonly known as: LASIX Take 1 tablet (20 mg total) by mouth daily. Hold for 2 days   insulin aspart 100 UNIT/ML injection Commonly known as: NovoLOG Inject 30 Units into the skin  4 (four) times daily -  with meals and at bedtime.   Lantus SoloStar 100 UNIT/ML Solostar Pen Generic drug: Insulin Glargine Inject 50 Units into the skin at bedtime.   loperamide 2 MG capsule Commonly known as: IMODIUM Take 2 capsules (4 mg total) by mouth as needed for diarrhea or loose stools.   Melatonin 3 MG Tabs Take 2 tablets (6 mg total) by mouth at bedtime.   metoCLOPramide 10 MG tablet Commonly known as: REGLAN Take 1 tablet (10 mg total) by mouth 3 (three) times daily before meals.   metoprolol succinate 25 MG 24 hr tablet Commonly known as: TOPROL-XL Take 1 tablet (25 mg total) by mouth daily.   nitroGLYCERIN 0.4 MG SL tablet Commonly known as: NITROSTAT Place 1 tablet (0.4 mg total) under the tongue every 5 (five) minutes as needed for chest pain.   ondansetron 4 MG disintegrating tablet Commonly known as: Zofran ODT Take 1 tablet (4 mg total) by mouth every 8 (eight) hours as needed for nausea or vomiting.   pantoprazole 40 MG tablet Commonly known as: PROTONIX Take 1 tablet (40 mg total) by mouth 2 (two) times daily.       Discharge Instructions: Please refer to Patient Instructions section of EMR for full details.  Patient was counseled important signs and symptoms that should prompt return to medical care, changes in medications, dietary instructions, activity restrictions, and follow up appointments.   Follow-Up Appointments: Follow-up  Information    [place]. Go on 02/27/2019.   Why: at 2:45 pm.  Please arrive 15 minutes before your appointment.  If you have symptoms of COVID including fever, cough, shortness of breath, loss of taste or smell, DO NOT come to the clinic.  Call first to see if you are eligible for an in-person appt. Contact information: [place] [phone]          Deborah Haw, MD 02/25/2019, 7:13 PM PGY-1, [place]

## 2019-02-21 NOTE — Progress Notes (Signed)
PT Cancellation Note  Patient Details Name: Deborah [name] MRN: [id number] DOB: 1966/03/01   Cancelled Treatment:    Reason Eval/Treat Not Completed: Pain limiting ability to participate. Checked on pt this afternoon to see if she was able to participate, but pt reported 10/10 pain in low abdominal area. She declined sitting up on EOB or getting up to chair. Will check back as schedule permits.   Galen Manila 02/21/2019, 2:00 PM

## 2019-02-21 NOTE — Progress Notes (Signed)
PT Cancellation Note  Patient Details Name: Deborah [name] MRN: [id number] DOB: 23-Sep-1965   Cancelled Treatment:    Reason Eval/Treat Not Completed: Pain limiting ability to participate. Pt recently had IV pain meds, but reports it was not helping. Pt in obvious pain at time of entry. Nursing informed. Pt unable to participate at this time.  Will check back as schedule permits.   Galen Manila 02/21/2019, 10:21 AM

## 2019-02-21 NOTE — Progress Notes (Signed)
HR consistent with previous values.

## 2019-02-21 NOTE — Progress Notes (Signed)
Patient's pulse is not an acute change. Patient has been in [place] rhythm since admission.

## 2019-02-21 NOTE — Progress Notes (Signed)
Family Medicine progress note  Reviewed most recent bmp, resulting around 0200 (drawn at around 0100). K 3.1, mg 1.2, ca 8.3. Will give another 61meq of kdur. Will give mag sulfate 2g. Recheck BMP w/ mag at 0500, will replete as needed. Patient now tolerating PO. Will restart home metoprolol, protonix, and atorvastatin. Will give metoprolol now. Split 25mg  XR form into 12.5mg  bid immediate release form. Restarting metoprolol will likely improve tachycardia and mildly hypertensive Bps she has had since admission.  Guadalupe Dawn MD PGY-3 Family Medicine Resident

## 2019-02-21 NOTE — Progress Notes (Signed)
Family Medicine Teaching Service Daily Progress Note Intern Pager: [phone]  Patient name: Deborah [name] Medical record number: [id number] Date of birth: [birth year] Age: 53 y.o. Gender: female  Primary Care Provider: [name], DO Consultants: None  Code Status: FULL   Pt Overview and Major Events to Date:  12/15-Admitted  Assessment and Plan: Deborah [name] is a 53 y.o. female presenting with chest pain and abdominal pain. PMH is significant for Type 2 Diabetes, Gastroparesis, Hyperlipidemia, Hypertension, Gout, Anxiety, Insomnia, Fibromyalgia and Chronic pain  Hyperglycemia with mildly increased anion gap  Type 2 diabetes CBGS: 157, 194, 152 overnight Overnight K 2.6 repleted with Kdur 57meq, K 3.5 today, Mg 1.8, repleted with 2g Mg, Anion gap closed, switched to home Lantus dose 50 units and sensitive sliding scale Home meds: Lantus 50 units at night, recently started dulaglutide at [facility] -Continue Lantus 50 units with sensitive sliding scale -Hold Dulaglutide -Carb modified diet  -Continue vitals per floor protocol -Continuous telemetry, pulse ox -CBGs with meals and QHS -Daily BMP  -Electrolytes replace as needed -PT OT  Intractable nausea and vomiting  Diabetic gastroparesis Pt writhing in pain this morning when I went to see her. Reports sharp, lower abdominal pain is radiating into her chest, 10/10 severity and says she usually has this pain in her "gastroparesis flare".  Denies pain overnight. Nausea started this morning and is requesting antiemetic Home meds: Ondansetron 4 mg Q8PRN, metoclopramide 10 mg TID, loperamide 4 mg PRN -Given IV Zofran once this am  -Hold oral medications, converted to IV equivalent as necessary -Zofran sparingly given QTc 470, monitor on telemetry  Chest pain, ACS rule out  Tachycardia  Currently has sharp, constant, central 10/10 chest pain. Non radiating. Pt feels it is related to her "gastroparesis flare up". She  reports it usually responds to morphine.  High-sensitivity troponin 29>37>71>76>80. Very low suspicion for ACS given chronicity of symptoms, reproduction to palpation, EKG without acute    Home med: Nitroglycerin PRN -Continuous telemetry  -Trend troponins until flat -Monitor chest pain -Sublingual nitroglycerin PRN   AKI on CKD Stage II likely 2/2 dehydration Cr 1.36 (1.67 on 12/15) Baseline ~ 1.4-1.5 -Encourage PO fluids  -Avoid nephrotoxic agents -Daily BMP   Tachycardia likely 2/2 dehydration  HR 111 Home 12.5mg  Metoprolol given overnight after gap closed, expect to have 12 hrs onset of action  -Continue to monitor HR  Hypertension BP 117/71, improved from blood pressures of 413 systolic yesterday  Home meds: Metoprolol 25 mg once a day, Lasix 20 mg once a day, hydralazine 25 mg 3 times daily (patient has had anaphylaxis with ACE inhibitor so contraindicated) -Restart antihypertensives  Hyperlipidemia  Takes 10mg  Atorvastatin  -Continue statin  Anxiety Home meds: Duloxetine 30 mg, hydroxyzine 10 mg 3 times daily -Continue duloxetine and hydroxyzine  Gout Denies any acute gout flares Home med: Allopurinol 100 mg daily, no current flares -Continue allopurinol  Chronic pain, fibromyalgia Home meds: Duloxetine 40mg  BID  -Continue duloxetine  Insomnia Melatonin in 3 mg nightly for sleep -Continue melatonin  GERD Home med: Pantoprazole 40mg  once daily -Continue pantoprazole  Seasonal allergies Home meds: Cetirizine, Astelin spray, Flonase spray -Continue home meds  FEN/GI: normal diet  Prophylaxis: Lovenox  Disposition: home after resolution of hyperglycemia   Subjective:  Pt writhing in pain this morning when I went to see her. Reports sharp, lower abdominal pain is radiating into her chest, 10/10 severity and says she usually has this pain in her "gastroparesis flare".  Denies pain  overnight. Nausea started this morning and is requesting  antiemetic  Objective: Temp:  [97.9 F (36.6 C)-98.5 F (36.9 C)] 98.5 F (36.9 C) (12/16 0342) Pulse Rate:  [117-130] 117 (12/16 0342) Resp:  [15-30] 20 (12/16 0342) BP: (117-200)/(71-167) 117/71 (12/16 0342) SpO2:  [92 %-100 %] 98 % (12/16 0342) Weight:  [151.5 kg] 151.5 kg (12/15 2221)   General: Alert, cooperative, in considerable distress due to pain Cardio: Normal S1 and S2, RRR. No murmurs or rubs.   Pulm: CTAB, no crackles, wheezing, or diminished breath sounds. Normal respiratory effort Abdomen: Proactive bowel sounds abdomen soft, generalized tenderness especially in the lower abdomen, no guarding or rebound tenderness. Extremities: No peripheral edema. Warm/ well perfused.  Strong radial pulse Neuro: Cranial nerves grossly intact  Laboratory: Recent Labs  Lab 02/19/19 1950 02/20/19 0620 02/20/19 1604 02/21/19 0445  WBC 13.0* 14.5*  --  13.5*  HGB 11.9* 10.9* 11.2* 8.9*  HCT 38.4 35.5* 33.0* 28.9*  PLT 403* 419*  --  351   Recent Labs  Lab 02/20/19 1234 02/20/19 1952 02/21/19 0111 02/21/19 0445  NA  --  143 141 145  K  --  2.6* 3.1* 3.5  CL  --  123* 109 111  CO2  --  13* 23 22  BUN  --  14 18 17   CREATININE  --  0.86 1.47* 1.36*  CALCIUM  --  5.2* 8.3* 8.2*  PROT 7.8  --   --   --   BILITOT 0.6  --   --   --   ALKPHOS 99  --   --   --   ALT 18  --   --   --   AST 16  --   --   --   GLUCOSE  --  153* 179* 167*      Imaging/Diagnostic Tests:   Lattie Haw, MD 02/21/2019, 7:35 AM PGY-1, [place] Intern pager: [phone], text pages welcome

## 2019-02-21 NOTE — Progress Notes (Addendum)
Inpatient Diabetes Program Recommendations  AACE/ADA: New Consensus Statement on Inpatient Glycemic Control (2015)  Target Ranges:  Prepandial:   less than 140 mg/dL      Peak postprandial:   less than 180 mg/dL (1-2 hours)      Critically ill patients:  140 - 180 mg/dL   Lab Results  Component Value Date   GLUCAP 401 (H) 02/21/2019   HGBA1C 9.6 (H) 02/20/2019    Review of Glycemic Control Results for Deborah [name], Deborah [name] (MRN [id number]) as of 02/21/2019 14:09  Ref. Range 02/21/2019 00:01 02/21/2019 00:55 02/21/2019 12:24  Glucose-Capillary Latest Ref Range: 70 - 99 mg/dL 194 (H) 152 (H) 401 (H)     Diabetes history: DM2 Outpatient Diabetes medications: Novolog 30 units 4 x a day AC and HS; Lantus 50 units HS Current orders for Inpatient glycemic control: Novolog 0-20 units TID, Lantus 50 units QD  Inpatient Diabetes Program Recommendations:    Noted correction was just added, lunch time was >400 mg/dL.   Addendum@1739 : Spoke with patient regarding outpatient diabetes management. Patient continues to express pain and is breathing heavy during conversation. Denies missing home dosages, however, reports drinking large amounts of sugary beverages at home and does not cover with insulin.  Reviewed patient's current A1c of 9.6%. Explained what a A1c is and what it measures. Also reviewed goal A1c with patient, importance of good glucose control @ home, and blood sugar goals. Briefly reviewed importance of establishing improved glycemic control, impact of blood sugars and gastroparesis, vascular changes and comorbidities associated.  Patient has supplies and meter.  Discussed sugary beverages and how this impacts blood sugars. Reviewed alternatives and encouraged limited amounts to help blood sugars.  Of note, Patient has not eaten well today. Instead has been drinking cranberry juice. Thus, explaining increased insulin requirement. In agreement with current plan.   Thanks, Bronson Curb, MSN, RNC-OB Diabetes Coordinator [phone] (8a-5p)

## 2019-02-22 ENCOUNTER — Other Ambulatory Visit: Payer: Medicare Other

## 2019-02-22 DIAGNOSIS — E876 Hypokalemia: Secondary | ICD-10-CM

## 2019-02-22 DIAGNOSIS — Z794 Long term (current) use of insulin: Secondary | ICD-10-CM

## 2019-02-22 DIAGNOSIS — R112 Nausea with vomiting, unspecified: Secondary | ICD-10-CM

## 2019-02-22 DIAGNOSIS — E1165 Type 2 diabetes mellitus with hyperglycemia: Principal | ICD-10-CM

## 2019-02-22 LAB — GLUCOSE, CAPILLARY
Glucose-Capillary: 130 mg/dL — ABNORMAL HIGH (ref 70–99)
Glucose-Capillary: 167 mg/dL — ABNORMAL HIGH (ref 70–99)
Glucose-Capillary: 272 mg/dL — ABNORMAL HIGH (ref 70–99)
Glucose-Capillary: 218 mg/dL — ABNORMAL HIGH (ref 70–99)

## 2019-02-22 LAB — BASIC METABOLIC PANEL
Anion gap: 9 (ref 5–15)
BUN: 12 mg/dL (ref 6–20)
CO2: 23 mmol/L (ref 22–32)
Calcium: 8.1 mg/dL — ABNORMAL LOW (ref 8.9–10.3)
Chloride: 107 mmol/L (ref 98–111)
Creatinine, Ser: 1.37 mg/dL — ABNORMAL HIGH (ref 0.44–1.00)
GFR calc Af Amer: 51 mL/min — ABNORMAL LOW (ref >60)
GFR calc non Af Amer: 44 mL/min — ABNORMAL LOW (ref >60)
Glucose, Bld: 152 mg/dL — ABNORMAL HIGH (ref 70–99)
Potassium: 3.3 mmol/L — ABNORMAL LOW (ref 3.5–5.1)
Sodium: 139 mmol/L (ref 135–145)

## 2019-02-22 LAB — CBC
HCT: 29.4 % — ABNORMAL LOW (ref 36.0–46.0)
Hemoglobin: 9 g/dL — ABNORMAL LOW (ref 12.0–15.0)
MCH: 28.4 pg (ref 26.0–34.0)
MCHC: 30.6 g/dL (ref 30.0–36.0)
MCV: 92.7 fL (ref 80.0–100.0)
Platelets: 353 10*3/uL (ref 150–400)
RBC: 3.17 MIL/uL — ABNORMAL LOW (ref 3.87–5.11)
RDW: 14.4 % (ref 11.5–15.5)
WBC: 14.5 10*3/uL — ABNORMAL HIGH (ref 4.0–10.5)
nRBC: 0 % (ref 0.0–0.2)

## 2019-02-22 LAB — MAGNESIUM: Magnesium: 1.5 mg/dL — ABNORMAL LOW (ref 1.7–2.4)

## 2019-02-22 MED ORDER — MAGNESIUM SULFATE 2 GM/50ML IV SOLN
2.0000 g | Freq: Once | INTRAVENOUS | Status: AC
  Administered 2019-02-22: 2 g via INTRAVENOUS
  Filled 2019-02-22: qty 50

## 2019-02-22 MED ORDER — METOCLOPRAMIDE HCL 5 MG/ML IJ SOLN
5.0000 mg | Freq: Three times a day (TID) | INTRAMUSCULAR | Status: DC
  Administered 2019-02-22 – 2019-02-23 (×4): 5 mg via INTRAVENOUS
  Filled 2019-02-22 (×4): qty 2

## 2019-02-22 MED ORDER — DICYCLOMINE HCL 10 MG PO CAPS
10.0000 mg | ORAL_CAPSULE | Freq: Two times a day (BID) | ORAL | Status: DC | PRN
  Administered 2019-02-22 – 2019-02-23 (×3): 10 mg via ORAL
  Filled 2019-02-22 (×3): qty 1

## 2019-02-22 MED ORDER — INSULIN ASPART 100 UNIT/ML ~~LOC~~ SOLN
5.0000 [IU] | Freq: Three times a day (TID) | SUBCUTANEOUS | Status: DC
  Administered 2019-02-22 – 2019-02-23 (×3): 5 [IU] via SUBCUTANEOUS

## 2019-02-22 MED ORDER — POTASSIUM CHLORIDE CRYS ER 20 MEQ PO TBCR
40.0000 meq | EXTENDED_RELEASE_TABLET | Freq: Once | ORAL | Status: AC
  Administered 2019-02-22: 40 meq via ORAL
  Filled 2019-02-22: qty 4

## 2019-02-22 NOTE — Progress Notes (Signed)
Inpatient Diabetes Program Recommendations  AACE/ADA: New Consensus Statement on Inpatient Glycemic Control   Target Ranges:  Prepandial:   less than 140 mg/dL      Peak postprandial:   less than 180 mg/dL (1-2 hours)      Critically ill patients:  140 - 180 mg/dL   Results for Deborah [name], Deborah [name] (MRN [id number]) as of 02/22/2019 08:23  Ref. Range 02/21/2019 00:55 02/21/2019 12:24 02/21/2019 16:54 02/21/2019 21:23 02/22/2019 05:56  Glucose-Capillary Latest Ref Range: 70 - 99 mg/dL 152 (H) 401 (H) 298 (H) 205 (H) 130 (H)   Review of Glycemic Control  Diabetes history: DM2 Outpatient Diabetes medications: Lantus 50 units QHS, Novolog 30 units TID with meals and at bedtime Current orders for Inpatient glycemic control: Lantus 50 units QHS, Novolog 0-20 units TID with meals  Inpatient Diabetes Program Recommendations:    Insulin-Meal Coverage: Please consider ordering Novolog 5 units TID with meals for meal coverage if patient eats at least 50% of meals.  Thanks, Barnie Alderman, RN, MSN, CDE Diabetes Coordinator Inpatient Diabetes Program [phone] (Team Pager from 8am to 5pm)

## 2019-02-22 NOTE — Progress Notes (Signed)
Family Medicine Teaching Service Daily Progress Note Intern Pager: [phone]  Patient name: Deborah [name] Medical record number: [id number] Date of birth: [birth year] Age: 53 y.o. Gender: female  Primary Care Provider: [name], DO Consultants: None  Code Status: FULL   Pt Overview and Major Events to Date:  12/15-Admitted  Assessment and Plan: Deborah [name] is a 53 y.o. female presenting with chest pain and abdominal pain. PMH is significant for Type 2 Diabetes, Gastroparesis, Hyperlipidemia, Hypertension, Gout, Anxiety, Insomnia, Fibromyalgia and Chronic pain  Hyperglycemia with mildly increased anion gap  Type 2 diabetes, treating CBGS: 130, 205, 298 Home meds: Lantus 50 units at night, Novolog 30U TID with meals and at bedtime -Continue Lantus 50 units with resistant sliding scale -Carb modified diet  -Continue vitals per floor protocol -Continuous telemetry, pulse ox -CBGs with meals and QHS -Daily BMP  -Electrolytes replace as needed -PT OT  Intractable nausea and vomiting  Diabetic gastroparesis, treating Reports ongoing pain abdominal pain 7/10 severity.  Would like more morphine.  Home meds: Ondansetron 4 mg Q8PRN, metoclopramide 10 mg TID, loperamide 4 mg PRN -Zofran sparingly given QTc 470, monitor on telemetry -IV metoclopramide 5 mg before meals -Oral Bentyl 10 mg Q12PRN for spasmodic abdominal pain -Morphine discontinued today as will further exacerbate her symptoms, patient is not to have narcotics  Chest pain, ACS rule out, stable Patient complains of ongoing chest pain, is likely related to diabetic gastroparesis High-sensitivity troponin 29>37>71>76>80. Very low suspicion for ACS. Home med: Nitroglycerin PRN -Continuous telemetry  -Monitor chest pain -Sublingual nitroglycerin PRN   AKI on CKD Stage II likely 2/2 dehydration, stable Cr 1.37 (1.36, on 12/17 improved from 1.67 on admission ) Baseline ~ 1.4-1.5 -Encourage PO fluids   -Avoid nephrotoxic agents -Daily BMP   Tachycardia likely 2/2 dehydration, stable HR 105 Home 12.5mg  Metoprolol given overnight after gap closed, expect to have 12 hrs onset of action  -Continue to monitor HR  Hypertension BP 136/83 Home meds: Metoprolol 25 mg once a day, Lasix 20 mg once a day, hydralazine 25 mg 3 times daily (patient has had anaphylaxis with ACE inhibitor so contraindicated) -Continue metoprolol  Hyperlipidemia  Takes 10mg  Atorvastatin  -Continue statin  Anxiety Home meds: Duloxetine 30 mg, hydroxyzine 10 mg 3 times daily -Continue duloxetine and hydroxyzine  Gout Denies any acute gout flares Home med: Allopurinol 100 mg daily, no current flares -Continue allopurinol  Chronic pain, fibromyalgia Home meds: Duloxetine 40mg  BID  -Continue duloxetine  Insomnia Melatonin in 3 mg nightly for sleep -Continue melatonin  GERD Home med: Pantoprazole 40mg  once daily -Continue pantoprazole  Seasonal allergies Home meds: Cetirizine, Astelin spray, Flonase spray -Continue home meds  FEN/GI: normal diet  Prophylaxis: Lovenox  Disposition: home after resolution of hyperglycemia   Subjective:  Reports ongoing pain abdominal pain 7/10 severity and emesis X 3 episodes overnight. Would like more morphine.  I recommended that patient sits up following her meals.  Dr. Owens Shark discussed with patient this morning that she will not be receiving any more narcotics for her abdominal pain as it would likely exacerbate her symptoms.  Objective: Temp:  [97.5 F (36.4 C)-98.3 F (36.8 C)] 98.3 F (36.8 C) (12/17 0553) Pulse Rate:  [105-123] 105 (12/17 0553) Resp:  [18-20] 20 (12/17 0553) BP: (133-150)/(77-101) 136/83 (12/17 0553) SpO2:  [93 %-98 %] 93 % (12/17 0553) Weight:  [150.8 kg] 150.8 kg (12/17 0210)   General: Alert and cooperative and appears to be in no acute distress Cardio: Normal  S1 and S2, RRR, still tachycardic. No murmurs or rubs.   Pulm:  Clear to auscultation bilaterally, no crackles, wheezing, or diminished breath sounds. Normal respiratory effort Abdomen: Bowel sounds normal. Abdomen soft and non-tender.  Extremities: No peripheral edema. Warm/ well perfused.  Strong radial pulse. Neuro: Cranial nerves grossly intact  Laboratory: Recent Labs  Lab 02/20/19 0620 02/20/19 1604 02/21/19 0445 02/22/19 0500  WBC 14.5*  --  13.5* 14.5*  HGB 10.9* 11.2* 8.9* 9.0*  HCT 35.5* 33.0* 28.9* 29.4*  PLT 419*  --  351 353   Recent Labs  Lab 02/20/19 1234 02/20/19 1604 02/21/19 0111 02/21/19 0445 02/21/19 1522 02/22/19 0500  NA  --   --  141 145  --  139  K  --   --  3.1* 3.5  --  3.3*  CL  --    < > 109 111  --  107  CO2  --    < > 23 22  --  23  BUN  --    < > 18 17  --  12  CREATININE  --    < > 1.47* 1.36*  --  1.37*  CALCIUM  --    < > 8.3* 8.2*  --  8.1*  PROT 7.8  --   --   --   --   --   BILITOT 0.6  --   --   --   --   --   ALKPHOS 99  --   --   --   --   --   ALT 18  --   --   --   --   --   AST 16  --   --   --   --   --   GLUCOSE  --    < > 179* 167* 387* 152*   < > = values in this interval not displayed.    Imaging/Diagnostic Tests:   Lattie Haw, MD 02/22/2019, 7:31 AM PGY-1, [place] Intern pager: [phone], text pages welcome

## 2019-02-22 NOTE — Progress Notes (Signed)
OT Cancellation Note  Patient Details Name: Deborah [name] MRN: [id number] DOB: 1966/02/26   Cancelled Treatment:    Reason Eval/Treat Not Completed: Patient declined, no reason specified;Pain limiting ability to participate; pt declined participation in OT at this time due to pain. Will follow and see as able.  Jolaine Artist, OT Acute Rehabilitation Services Pager [phone] Office [phone]   Delight Stare 02/22/2019, 8:03 AM

## 2019-02-22 NOTE — Progress Notes (Signed)
PT Cancellation Note  Patient Details Name: Deborah [name] MRN: [id number] DOB: 28-Sep-1965   Cancelled Treatment:    Reason Eval/Treat Not Completed: Patient declined, no reason specified. Pt refusing mobility at this time due to pain and weakness. PT attempts to educate patient on the need for mobility to improve strength and abdominal pain (despite recently having morphine) however patient continues to refuse. PT will attempt to follow up at a later time when pt is more agreeable.   Zenaida Niece 02/22/2019, 3:59 PM

## 2019-02-23 DIAGNOSIS — D259 Leiomyoma of uterus, unspecified: Secondary | ICD-10-CM

## 2019-02-23 DIAGNOSIS — K3184 Gastroparesis: Secondary | ICD-10-CM

## 2019-02-23 LAB — COMPREHENSIVE METABOLIC PANEL
ALT: 13 U/L (ref 0–44)
AST: 12 U/L — ABNORMAL LOW (ref 15–41)
Albumin: 2.5 g/dL — ABNORMAL LOW (ref 3.5–5.0)
Alkaline Phosphatase: 79 U/L (ref 38–126)
Anion gap: 8 (ref 5–15)
BUN: 13 mg/dL (ref 6–20)
CO2: 25 mmol/L (ref 22–32)
Calcium: 8.3 mg/dL — ABNORMAL LOW (ref 8.9–10.3)
Chloride: 106 mmol/L (ref 98–111)
Creatinine, Ser: 1.4 mg/dL — ABNORMAL HIGH (ref 0.44–1.00)
GFR calc Af Amer: 49 mL/min — ABNORMAL LOW (ref >60)
GFR calc non Af Amer: 42 mL/min — ABNORMAL LOW (ref >60)
Glucose, Bld: 187 mg/dL — ABNORMAL HIGH (ref 70–99)
Potassium: 3.3 mmol/L — ABNORMAL LOW (ref 3.5–5.1)
Sodium: 139 mmol/L (ref 135–145)
Total Bilirubin: 0.4 mg/dL (ref 0.3–1.2)
Total Protein: 5.7 g/dL — ABNORMAL LOW (ref 6.5–8.1)

## 2019-02-23 LAB — CBC
HCT: 28.5 % — ABNORMAL LOW (ref 36.0–46.0)
Hemoglobin: 8.6 g/dL — ABNORMAL LOW (ref 12.0–15.0)
MCH: 28 pg (ref 26.0–34.0)
MCHC: 30.2 g/dL (ref 30.0–36.0)
MCV: 92.8 fL (ref 80.0–100.0)
Platelets: 317 10*3/uL (ref 150–400)
RBC: 3.07 MIL/uL — ABNORMAL LOW (ref 3.87–5.11)
RDW: 14.3 % (ref 11.5–15.5)
WBC: 10.1 10*3/uL (ref 4.0–10.5)
nRBC: 0 % (ref 0.0–0.2)

## 2019-02-23 LAB — MAGNESIUM: Magnesium: 1.8 mg/dL (ref 1.7–2.4)

## 2019-02-23 LAB — GLUCOSE, CAPILLARY
Glucose-Capillary: 183 mg/dL — ABNORMAL HIGH (ref 70–99)
Glucose-Capillary: 171 mg/dL — ABNORMAL HIGH (ref 70–99)

## 2019-02-23 MED ORDER — POTASSIUM CHLORIDE CRYS ER 20 MEQ PO TBCR
40.0000 meq | EXTENDED_RELEASE_TABLET | Freq: Four times a day (QID) | ORAL | Status: DC
  Administered 2019-02-23: 40 meq via ORAL
  Filled 2019-02-23: qty 2

## 2019-02-23 NOTE — Progress Notes (Deleted)
[facility] called and spoke with pt's nurse regarding flushing of midline. Asked Aldona Bar, RN if she has been checked off and is comfortable with flushing ML or if she needs assistance and check off; she stated she is comfortable and checked off.

## 2019-02-23 NOTE — Progress Notes (Signed)
Discharge teaching provided. Meds, diet, activity, follow up appointments reviewed and all questions answered. Copy of instructions given to patient. Waiting on brother to come pick her up.

## 2019-02-23 NOTE — Evaluation (Signed)
Physical Therapy Evaluation Patient Details Name: Deborah [name] MRN: [id number] DOB: 01/21/1966 Today's Date: 02/23/2019   History of Present Illness  53 y.o. female presenting with chest pain and abdominal pain. PMH is significant for Type 2 Diabetes, Gastroparesis, Hyperlipidemia, Hypertension, Gout, Anxiety, Insomnia, Fibromyalgia and Chronic pain. Concern for diabetic gastroparesis.  Clinical Impression  Pt presents to PT at or near her functional baseline, performing transfers modI. Pt has been performing bed mobility without assistance requirements per nursing staff although unwilling to attempt during session. Pt also declining wheelchair mobility assessment at this time. Pt is limited by LE and abdominal pain. Pt reports the need for a lightweight wheelchair as her current wheelchair is too heavy for her or her mother to lift into and out of vehicle. Pt refusing possibility of PT follow-up in the home setting and declining further PT needs at this time. Acute PT will sign off at this time, please re-consult if further mobility concerns arise.    Follow Up Recommendations No PT follow up(pt refusing possibility of HHPT)    Equipment Recommendations  Wheelchair (measurements PT)(pt reports need for lightweight bariatric wheelchair)    Recommendations for Other Services       Precautions / Restrictions Precautions Precautions: Fall Restrictions Weight Bearing Restrictions: No      Mobility  Bed Mobility Overal bed mobility: (pt received and left sitting at edge of bed)                Transfers Overall transfer level: Modified independent Equipment used: None             General transfer comment: Pt performs Stand step turn transfer to and from bedside commode  Ambulation/Gait                Hotel manager mobility: (pt declines wheelchair mobility at this time)  Modified Rankin  (Stroke Patients Only)       Balance Overall balance assessment: Modified Independent(pt standing with unilateral UE support briefly during transf)                                           Pertinent Vitals/Pain Pain Assessment: 0-10 Pain Score: 8  Pain Location: BLEs, abdomen Pain Descriptors / Indicators: Aching Pain Intervention(s): Limited activity within patient's tolerance    Home Living Family/patient expects to be discharged to:: Private residence Living Arrangements: Alone Available Help at Discharge: Family;Available PRN/intermittently Type of Home: House Home Access: Level entry     Home Layout: One level Home Equipment: Bedside commode;Shower seat;Wheelchair - [employer] - 2 wheels      Prior Function Level of Independence: Needs assistance   Gait / Transfers Assistance Needed: uses WC mainly for moblity pushing with feet. performs bed mobility and stand pivot transfers independently. Does ambulate minimally with use of RW  ADL's / Homemaking Assistance Needed: independent ADLs, limited IADLs         Hand Dominance        Extremity/Trunk Assessment   Upper Extremity Assessment Upper Extremity Assessment: Defer to OT evaluation    Lower Extremity Assessment Lower Extremity Assessment: Overall WFL for tasks assessed    Cervical / Trunk Assessment Cervical / Trunk Assessment: Other exceptions(body habitus)  Communication   Communication: No difficulties  Cognition Arousal/Alertness: Awake/alert Behavior During Therapy: [facility]  for tasks assessed/performed Overall Cognitive Status: Within Functional Limits for tasks assessed                                        General Comments General comments (skin integrity, edema, etc.): VSS    Exercises     Assessment/Plan    PT Assessment Patent does not need any further PT services  PT Problem List         PT Treatment Interventions      PT Goals (Current  goals can be found in the Care Plan section)       Frequency     Barriers to discharge        Co-evaluation PT/OT/SLP Co-Evaluation/Treatment: Yes Reason for Co-Treatment: Necessary to address cognition/behavior during functional activity PT goals addressed during session: Mobility/safety with mobility;Balance         AM-PAC PT "6 Clicks" Mobility  Outcome Measure Help needed turning from your back to your side while in a flat bed without using bedrails?: A Little Help needed moving from lying on your back to sitting on the side of a flat bed without using bedrails?: A Little Help needed moving to and from a bed to a chair (including a wheelchair)?: None Help needed standing up from a chair using your arms (e.g., wheelchair or bedside chair)?: None Help needed to walk in hospital room?: A Little Help needed climbing 3-5 steps with a railing? : A Little 6 Click Score: 20    End of Session Equipment Utilized During Treatment: (none) Activity Tolerance: Patient tolerated treatment well Patient left: in bed;with call bell/phone within reach Nurse Communication: Mobility status PT Visit Diagnosis: Pain Pain - part of body: Leg(abdomen)    Time: 6568-1275 PT Time Calculation (min) (ACUTE ONLY): 12 min   Charges:   PT Evaluation $PT Eval Low Complexity: 1 Low          Zenaida Niece, PT, DPT Acute Rehabilitation Pager: [phone]   Zenaida Niece 02/23/2019, 12:35 PM

## 2019-02-23 NOTE — Progress Notes (Signed)
Family Medicine Teaching Service Daily Progress Note Intern Pager: [phone]  Patient name: Deborah [name] Medical record number: [id number] Date of birth: 13-May-1965 Age: 53 y.o. Gender: female  Primary Care Provider: [name], DO Consultants: None  Code Status: FULL   Pt Overview and Major Events to Date:  12/15-Admitted  Assessment and Plan: Deborah [name] is a 53 y.o. female presenting with chest pain and abdominal pain. PMH is significant for Type 2 Diabetes, Gastroparesis, Hyperlipidemia, Hypertension, Gout, Anxiety, Insomnia, Fibromyalgia and Chronic pain  Hyperglycemia with mildly increased anion gap  Type 2 diabetes, treating CBGS: 183, 218  Home meds: Lantus 50 units at night, Novolog 30U TID with meals and at bedtime -Continue Lantus 50 units, Novolog 5 units TID with resistant sliding scale -Carb modified diet  -Continue vitals per floor protocol -Continuous telemetry, pulse ox -CBGs with meals and QHS -Daily BMP  -Electrolytes replace as needed -Discharge home today  Intractable nausea and vomiting  Diabetic gastroparesis, treating Vomited once overnight, abdominal pain 7/10 severity Home meds: Ondansetron 4 mg Q8PRN, metoclopramide 10 mg TID, loperamide 4 mg PRN -Zofran sparingly given QTc 470, monitor on telemetry -IV metoclopramide 5 mg before meals -Oral Bentyl 10 mg Q12PRN for spasmodic abdominal pain -Morphine discontinued today as will further exacerbate her symptoms, patient is not to have narcotics  Chest pain, ACS rule out, stable Patient complains of ongoing chest pain, is likely related to diabetic gastroparesis High-sensitivity troponin 29>37>71>76>80. Very low suspicion for ACS. Home med: Nitroglycerin PRN -Continuous telemetry  -Monitor chest pain -Sublingual nitroglycerin PRN   Hypokalemia, hypomagnesemia K 3.3 today, Mg 1.8 -Repleted with KDur 43meq  -Continue to monitor electrolytes  AKI on CKD Stage II likely 2/2  dehydration, stable Cr 1.4 ( Cr 1.37 on 12/18) Baseline ~ 1.4-1.5 -Encourage PO fluids  -Avoid nephrotoxic agents -Daily BMP   Tachycardia likely 2/2 dehydration, stable HR 97 Home 12.5mg  Metoprolol given overnight after gap closed, expect to have 12 hrs onset of action  -Continue to monitor HR  Hypertension BP 121/70 Home meds: Metoprolol 25 mg once a day, Lasix 20 mg once a day, hydralazine 25 mg 3 times daily (patient has had anaphylaxis with ACE inhibitor so contraindicated) -Continue metoprolol  Hyperlipidemia  Takes 10mg  Atorvastatin  -Continue statin  Anxiety Home meds: Duloxetine 30 mg, hydroxyzine 10 mg 3 times daily -Continue duloxetine and hydroxyzine  Gout Denies any acute gout flares Home med: Allopurinol 100 mg daily, no current flares -Continue allopurinol  Chronic pain, fibromyalgia Home meds: Duloxetine 40mg  BID  -Continue duloxetine  Insomnia Melatonin in 3 mg nightly for sleep -Continue melatonin  GERD Home med: Pantoprazole 40mg  once daily -Continue pantoprazole  Seasonal allergies Home meds: Cetirizine, Astelin spray, Flonase spray -Continue home meds  FEN/GI: normal diet  Prophylaxis: Lovenox  Disposition: home after resolution of hyperglycemia   Subjective:  Vomited once overnight and abdominal pain 7/10 severity.  Objective: Temp:  [97.2 F (36.2 C)-98.9 F (37.2 C)] 98.2 F (36.8 C) (12/18 0508) Pulse Rate:  [97-109] 97 (12/18 0508) Resp:  [18-20] 18 (12/18 0508) BP: (109-135)/(57-83) 121/70 (12/18 0508) SpO2:  [92 %-99 %] 97 % (12/18 0508) Weight:  [151 kg] 151 kg (12/18 0508)   General: Alert, appears well, cooperative no acute distress Cardio: Normal S1 and S2, RRR. No murmurs or rubs.   Pulm: CTAB, no crackles, wheezing, or diminished breath sounds. Normal respiratory effort Abdomen: Bowel sounds normal. Abdomen soft and non-tender.  Extremities: No peripheral edema. Warm/ well perfused.  Strong radial  pulse Neuro: Cranial nerves grossly intact  Laboratory: Recent Labs  Lab 02/20/19 0620 02/20/19 1604 02/21/19 0445 02/22/19 0500  WBC 14.5*  --  13.5* 14.5*  HGB 10.9* 11.2* 8.9* 9.0*  HCT 35.5* 33.0* 28.9* 29.4*  PLT 419*  --  351 353   Recent Labs  Lab 02/20/19 1234 02/20/19 1604 02/21/19 0111 02/21/19 0445 02/21/19 1522 02/22/19 0500  NA  --   --  141 145  --  139  K  --   --  3.1* 3.5  --  3.3*  CL  --    < > 109 111  --  107  CO2  --    < > 23 22  --  23  BUN  --    < > 18 17  --  12  CREATININE  --    < > 1.47* 1.36*  --  1.37*  CALCIUM  --    < > 8.3* 8.2*  --  8.1*  PROT 7.8  --   --   --   --   --   BILITOT 0.6  --   --   --   --   --   ALKPHOS 99  --   --   --   --   --   ALT 18  --   --   --   --   --   AST 16  --   --   --   --   --   GLUCOSE  --    < > 179* 167* 387* 152*   < > = values in this interval not displayed.    Imaging/Diagnostic Tests:   Lattie Haw, MD 02/23/2019, 6:45 AM PGY-1, [place] Intern pager: [phone], text pages welcome

## 2019-02-23 NOTE — Discharge Instructions (Signed)
Deborah [name],  You were admitted with a flareup of your diabetic gastroparesis and your very high sugar levels.  Your sugar levels are stable now discharge and your stomach pains have improved since you have been in hospital.  We are continuing your Reglan 3 times a day before meals, this should help with your stomach symptoms.  You will be followed up as an outpatient by the GI doctors as this has been a recurrent issue for many years.  You also have a fibroid in her uterus and we think this may be contributing to your symptoms so you will be followed up by a GYN doctor.  You will also be followed up for by a cardiologist to check the condition of your heart.  Please follow-up with your PCP: Dr. Tarry Kos on 12/22 at 2:45 PM at [facility].  In the meantime if your symptoms worsen or you develop chest pain, fevers, become drowsy please go to the ER urgently.  Take care and best wishes,  Dr. Posey Pronto

## 2019-02-23 NOTE — Evaluation (Addendum)
Occupational Therapy Evaluation and Discharge Patient Details Name: Deborah [name] MRN: [id number] DOB: December 03, 1965 Today's Date: 02/23/2019    History of Present Illness 53 y.o. female presenting with chest pain and abdominal pain. PMH is significant for Type 2 Diabetes, Gastroparesis, Hyperlipidemia, Hypertension, Gout, Anxiety, Insomnia, Fibromyalgia and Chronic pain. Concern for diabetic gastroparesis.   Clinical Impression   PTA patient reports independent with ADLs, using wc for mobility and transfer with modified independence.  She was admitted for above and presents near baseline modified independent level with transfers to [facility] and simulated dressing tasks. Limited by B LE and abdominal pain, decreased activity tolerance and endurance; but is fairly sedentary at home as well.  At this time, recommend follow up [facility] services at discharge in order to optimize independence and safety with ADLs, IADLs and mobility at home.  No acute services needed at this time.     Follow Up Recommendations  Home health OT(will likely refuse HHOT)    Equipment Recommendations  None recommended by OT    Recommendations for Other Services       Precautions / Restrictions Precautions Precautions: Fall Restrictions Weight Bearing Restrictions: No      Mobility Bed Mobility Overal bed mobility: (pt received and left sitting at edge of bed)             General bed mobility comments: EOB upon entry   Transfers Overall transfer level: Modified independent Equipment used: None             General transfer comment: stand pivot transfer to/from [facility] without assist    Balance Overall balance assessment: Mild deficits observed, not formally tested                                         ADL either performed or assessed with clinical judgement   ADL Overall ADL's : At baseline;Modified independent                                       General  ADL Comments: patient demostrating ability to complete toilet transfer to [facility], simulated EOB dressing tasks with independence     Vision   Vision Assessment?: No apparent visual deficits     Perception     Praxis      Pertinent Vitals/Pain Pain Assessment: 0-10 Pain Score: 8  Pain Location: BLEs, abdomen Pain Descriptors / Indicators: Aching Pain Intervention(s): Monitored during session;Limited activity within patient's tolerance     Hand Dominance Right   Extremity/Trunk Assessment Upper Extremity Assessment Upper Extremity Assessment: Overall WFL for tasks assessed   Lower Extremity Assessment Lower Extremity Assessment: Defer to PT evaluation   Cervical / Trunk Assessment Cervical / Trunk Assessment: Other exceptions Cervical / Trunk Exceptions: body habitus   Communication Communication Communication: No difficulties   Cognition Arousal/Alertness: Awake/alert Behavior During Therapy: WFL for tasks assessed/performed Overall Cognitive Status: Within Functional Limits for tasks assessed                                     General Comments  VSS    Exercises     Shoulder Instructions      Home Living Family/patient expects to be discharged to:: Private residence Living Arrangements:  Alone Available Help at Discharge: Family;Available PRN/intermittently Type of Home: House Home Access: Level entry     Home Layout: One level     Bathroom Shower/Tub: Teacher, early years/pre: Standard     Home Equipment: Bedside commode;Shower seat;Wheelchair - [employer] - 2 wheels          Prior Functioning/Environment Level of Independence: Needs assistance  Gait / Transfers Assistance Needed: uses WC mainly for moblity pushing with feet. performs bed mobility and stand pivot transfers independently. Does ambulate minimally with use of RW ADL's / Homemaking Assistance Needed: independent ADLs, limited IADLs             OT  Problem List: Decreased activity tolerance;Pain;Decreased knowledge of precautions;Decreased knowledge of use of DME or AE      OT Treatment/Interventions:      OT Goals(Current goals can be found in the care plan section) Acute Rehab OT Goals Patient Stated Goal: to get back home OT Goal Formulation: With patient  OT Frequency:     Barriers to D/C:            Co-evaluation PT/OT/SLP Co-Evaluation/Treatment: Yes Reason for Co-Treatment: Necessary to address cognition/behavior during functional activity PT goals addressed during session: Mobility/safety with mobility;Balance OT goals addressed during session: ADL's and self-care      AM-PAC OT "6 Clicks" Daily Activity     Outcome Measure Help from another person eating meals?: None Help from another person taking care of personal grooming?: None Help from another person toileting, which includes using toliet, bedpan, or urinal?: None Help from another person bathing (including washing, rinsing, drying)?: None Help from another person to put on and taking off regular upper body clothing?: None Help from another person to put on and taking off regular lower body clothing?: None 6 Click Score: 24   End of Session Nurse Communication: Mobility status  Activity Tolerance: Patient tolerated treatment well Patient left: with call bell/phone within reach;Other (comment)(seated EOB )  OT Visit Diagnosis: Other abnormalities of gait and mobility (R26.89);Pain Pain - Right/Left: (bil) Pain - part of body: Leg(abdomen)                Time: 8242-3536 OT Time Calculation (min): 12 min Charges:  OT General Charges $OT Visit: 1 Visit OT Evaluation $OT Eval Low Complexity: 1 Low  Jolaine Artist, OT Acute Rehabilitation Services Pager [phone] Office [phone]   Delight Stare 02/23/2019, 1:18 PM

## 2019-02-23 NOTE — Progress Notes (Signed)
[facility] called and spoke with pt's nurse regarding flushing of midline. Asked Aldona Bar, RN if she has been checked off and is comfortable with flushing ML or if she needs assistance and check off; she stated she is comfortable and checked off.

## 2019-02-27 ENCOUNTER — Inpatient Hospital Stay: Payer: Medicare Other | Admitting: Family Medicine

## 2019-03-05 ENCOUNTER — Other Ambulatory Visit: Payer: Self-pay | Admitting: Family Medicine

## 2019-03-10 ENCOUNTER — Emergency Department (HOSPITAL_COMMUNITY)
Admission: EM | Admit: 2019-03-10 | Discharge: 2019-03-10 | Disposition: A | Discharge status: Home / Self Care | Payer: Medicare Other | Source: Home / Self Care | Attending: Emergency Medicine | Admitting: Emergency Medicine

## 2019-03-10 ENCOUNTER — Encounter (HOSPITAL_COMMUNITY): Payer: Self-pay | Admitting: Emergency Medicine

## 2019-03-10 ENCOUNTER — Other Ambulatory Visit: Payer: Self-pay

## 2019-03-10 ENCOUNTER — Emergency Department (HOSPITAL_COMMUNITY): Payer: Medicare Other

## 2019-03-10 DIAGNOSIS — E1140 Type 2 diabetes mellitus with diabetic neuropathy, unspecified: Secondary | ICD-10-CM | POA: Insufficient documentation

## 2019-03-10 DIAGNOSIS — R1084 Generalized abdominal pain: Secondary | ICD-10-CM | POA: Insufficient documentation

## 2019-03-10 DIAGNOSIS — R112 Nausea with vomiting, unspecified: Secondary | ICD-10-CM

## 2019-03-10 DIAGNOSIS — E1122 Type 2 diabetes mellitus with diabetic chronic kidney disease: Secondary | ICD-10-CM | POA: Insufficient documentation

## 2019-03-10 DIAGNOSIS — Z794 Long term (current) use of insulin: Secondary | ICD-10-CM | POA: Insufficient documentation

## 2019-03-10 DIAGNOSIS — K3184 Gastroparesis: Secondary | ICD-10-CM | POA: Diagnosis not present

## 2019-03-10 DIAGNOSIS — N182 Chronic kidney disease, stage 2 (mild): Secondary | ICD-10-CM | POA: Insufficient documentation

## 2019-03-10 DIAGNOSIS — E1143 Type 2 diabetes mellitus with diabetic autonomic (poly)neuropathy: Secondary | ICD-10-CM | POA: Insufficient documentation

## 2019-03-10 DIAGNOSIS — I5032 Chronic diastolic (congestive) heart failure: Secondary | ICD-10-CM | POA: Insufficient documentation

## 2019-03-10 DIAGNOSIS — R072 Precordial pain: Secondary | ICD-10-CM | POA: Insufficient documentation

## 2019-03-10 DIAGNOSIS — I130 Hypertensive heart and chronic kidney disease with heart failure and stage 1 through stage 4 chronic kidney disease, or unspecified chronic kidney disease: Secondary | ICD-10-CM | POA: Insufficient documentation

## 2019-03-10 LAB — LIPASE, BLOOD: Lipase: 22 U/L (ref 11–51)

## 2019-03-10 LAB — HEPATIC FUNCTION PANEL
ALT: 20 U/L (ref 0–44)
AST: 19 U/L (ref 15–41)
Albumin: 3.5 g/dL (ref 3.5–5.0)
Alkaline Phosphatase: 94 U/L (ref 38–126)
Bilirubin, Direct: <0.1 mg/dL (ref 0.0–0.2)
Total Bilirubin: 0.9 mg/dL (ref 0.3–1.2)
Total Protein: 7.5 g/dL (ref 6.5–8.1)

## 2019-03-10 LAB — TROPONIN I (HIGH SENSITIVITY)
Troponin I (High Sensitivity): 28 ng/L — ABNORMAL HIGH (ref <18)
Troponin I (High Sensitivity): 30 ng/L — ABNORMAL HIGH (ref <18)

## 2019-03-10 LAB — BASIC METABOLIC PANEL
Anion gap: 15 (ref 5–15)
BUN: 21 mg/dL — ABNORMAL HIGH (ref 6–20)
CO2: 22 mmol/L (ref 22–32)
Calcium: 8.8 mg/dL — ABNORMAL LOW (ref 8.9–10.3)
Chloride: 99 mmol/L (ref 98–111)
Creatinine, Ser: 1.42 mg/dL — ABNORMAL HIGH (ref 0.44–1.00)
GFR calc Af Amer: 48 mL/min — ABNORMAL LOW (ref >60)
GFR calc non Af Amer: 42 mL/min — ABNORMAL LOW (ref >60)
Glucose, Bld: 437 mg/dL — ABNORMAL HIGH (ref 70–99)
Potassium: 4.1 mmol/L (ref 3.5–5.1)
Sodium: 136 mmol/L (ref 135–145)

## 2019-03-10 LAB — CBC
HCT: 35.3 % — ABNORMAL LOW (ref 36.0–46.0)
Hemoglobin: 10.8 g/dL — ABNORMAL LOW (ref 12.0–15.0)
MCH: 28.5 pg (ref 26.0–34.0)
MCHC: 30.6 g/dL (ref 30.0–36.0)
MCV: 93.1 fL (ref 80.0–100.0)
Platelets: 381 10*3/uL (ref 150–400)
RBC: 3.79 MIL/uL — ABNORMAL LOW (ref 3.87–5.11)
RDW: 14.3 % (ref 11.5–15.5)
WBC: 14.7 10*3/uL — ABNORMAL HIGH (ref 4.0–10.5)
nRBC: 0 % (ref 0.0–0.2)

## 2019-03-10 MED ORDER — ONDANSETRON 4 MG PO TBDP
4.0000 mg | ORAL_TABLET | Freq: Once | ORAL | Status: AC
  Administered 2019-03-10: 4 mg via ORAL
  Filled 2019-03-10: qty 1

## 2019-03-10 MED ORDER — PANTOPRAZOLE SODIUM 40 MG PO TBEC: 40.0000 mg | DELAYED_RELEASE_TABLET | Freq: Every day | ORAL | 0 refills | Status: DC

## 2019-03-10 MED ORDER — SODIUM CHLORIDE 0.9 % IV BOLUS: 1000.0000 mL | Freq: Once | INTRAVENOUS | Status: DC

## 2019-03-10 MED ORDER — ONDANSETRON 4 MG PO TBDP: 4.0000 mg | ORAL_TABLET | Freq: Three times a day (TID) | ORAL | 0 refills | Status: DC | PRN

## 2019-03-10 MED ORDER — SODIUM CHLORIDE 0.9% FLUSH: 3.0000 mL | Freq: Once | INTRAVENOUS | Status: DC

## 2019-03-10 MED ORDER — ONDANSETRON HCL 4 MG/2ML IJ SOLN: 4.0000 mg | Freq: Once | INTRAMUSCULAR | Status: DC

## 2019-03-10 MED ORDER — DICYCLOMINE HCL 20 MG PO TABS: 20.0000 mg | ORAL_TABLET | Freq: Three times a day (TID) | ORAL | 0 refills | Status: DC | PRN

## 2019-03-10 MED ORDER — MORPHINE SULFATE (PF) 4 MG/ML IV SOLN
4.0000 mg | Freq: Once | INTRAVENOUS | Status: AC
  Administered 2019-03-10: 10:00:00 4 mg via INTRAVENOUS
  Filled 2019-03-10: qty 1

## 2019-03-10 NOTE — Discharge Instructions (Signed)
You have been seen in the Emergency Department (ED) today for nausea and vomiting.  Your work up today has not shown a clear cause for your symptoms. °You have been prescribed Zofran; please use as prescribed as needed for your nausea. ° °Follow up with your doctor as soon as possible regarding today?s emergent visit and your symptoms of nausea.  ° °Return to the ED if you develop abdominal, bloody vomiting, bloody diarrhea, if you are unable to tolerate fluids due to vomiting, or if you develop other symptoms that concern you. ° °

## 2019-03-10 NOTE — ED Notes (Signed)
Pt stand and pivoted self to bedside commode w/o assistance.

## 2019-03-10 NOTE — ED Provider Notes (Signed)
Emergency Department Provider Note   I have reviewed the triage vital signs and the nursing notes.   HISTORY  Chief Complaint Chest Pain   HPI Deborah [name] is a 54 y.o. female with past medical history reviewed below including fibromyalgia and reported gastroparesis (negative gastric emptying study per last discharge summary) presents to the emergency department for evaluation of abdominal pain with nausea/vomiting, chest pain.  Patient states that she has a history of cyclical vomiting and this feels similar.  She has developed some associated central chest pain without radiation.  No shortness of breath symptoms.  No fevers or chills.  She tells me that she typically develops chest pain with the GI symptoms in the past.  She was admitted recently  with similar presentation to the family medicine service and discharged on 12/16.  Symptoms have been present for the past 24 hours without radiation or other modifying features.    Past Medical History:  Diagnosis Date  . Acute back pain with sciatica, left   . Acute back pain with sciatica, right   . Anemia, unspecified   . Chest pain 12/2015  . Chronic pain   . Diabetes mellitus   . Esophageal reflux   . Fibromyalgia   . Gastric ulcer   . Gastroparesis   . Gout   . Hyperlipidemia   . Hypertension   . Lumbosacral stenosis   . Obesity   . Pneumonia   . [name]   . Stroke ([place]) 02/2011  . Vitamin B12 deficiency anemia     Patient Active Problem List   Diagnosis Date Noted  . Fibroid uterus 02/23/2019  . Congestion of nasal sinus 01/24/2019  . Chronic diastolic heart failure ([name]) 12/19/2018  . Intractable nausea and vomiting 12/17/2018  . Hypoxia 12/17/2018  . Hyperglycemia 12/17/2018  . Elevated troponin I level   . Vomiting 07/18/2018  . Abdominal pain 07/17/2018  . Hyperkalemia 07/17/2018  . Cardiac arrest ([place]) 11/29/2017  . Pelvic mass 11/29/2017  . Leukocytosis 11/29/2017  . Anxiety  11/29/2017  . Allergic reaction to contrast media, initial encounter 11/28/2017  . Palliative care encounter   . Back pain 03/19/2017  . Stroke (cerebrum) ([place]) 03/19/2017  . GERD (gastroesophageal reflux disease) 03/19/2017  . Depression 03/19/2017  . Obesity   . Urinary tract infection 08/16/2016  . Normocytic normochromic anemia 08/16/2016  . Gastroparesis 08/16/2016  . Intractable vomiting with nausea 06/17/2016  . Diabetic gastroparesis ([place]) 06/05/2016  . Gout 06/05/2016  . AKI (acute kidney injury) ([place]) 12/06/2015  . Chest pain 09/26/2015  . Hypokalemia 09/26/2015  . Hypomagnesemia 09/26/2015  . Nausea and vomiting 08/20/2015  . Gout flare 05/27/2015  . Lower abdominal pain 05/26/2015  . DKA (diabetic ketoacidoses) ([place]) 05/25/2015  . Uncontrolled type 2 diabetes mellitus with diabetic neuropathy, with Jesscia Imm-term current use of insulin ([place]) 05/25/2015  . Type 2 diabetes mellitus with hyperglycemia, with Shayleen Eppinger-term current use of insulin ([place]) 05/25/2015  . CKD (chronic kidney disease), stage II 05/25/2015  . Essential hypertension, benign 09/28/2013    Past Surgical History:  Procedure Laterality Date  . CATARACT EXTRACTION  01/2014  . CHOLECYSTECTOMY      Allergies Contrast media [iodinated diagnostic agents], Diazepam, Isovue [iopamidol], Lisinopril, Penicillins, Acetaminophen, Tolmetin, Aspirin, Food, Nsaids, and Tramadol  Family History  Problem Relation Age of Onset  . Diabetes Mother   . Diabetes Father   . Heart disease Father   . Diabetes Sister   . Congestive Heart Failure Sister  81  . Diabetes Brother     Social History Social History   Tobacco Use  . Smoking status: Never Smoker  . Smokeless tobacco: Never Used  Substance Use Topics  . Alcohol use: No  . Drug use: No    Review of Systems  Constitutional: No fever/chills Eyes: No visual changes. ENT: No sore throat. Cardiovascular: Positive central chest pain. Respiratory: Denies  shortness of breath. Gastrointestinal: Positive diffuse abdominal pain. Positive nausea and vomiting.  No diarrhea.  No constipation. Genitourinary: Negative for dysuria. Musculoskeletal: Negative for back pain. Skin: Negative for rash. Neurological: Negative for headaches, focal weakness or numbness.  10-point ROS otherwise negative.  ____________________________________________   PHYSICAL EXAM:  VITAL SIGNS: ED Triage Vitals  Enc Vitals Group     BP 03/10/19 0337 (!) 172/103     Pulse Rate 03/10/19 0335 (!) 117     Resp 03/10/19 0335 (!) 21     Temp 03/10/19 0335 97.9 F (36.6 C)     Temp Source 03/10/19 0335 Oral     SpO2 03/10/19 0335 100 %     Weight 03/10/19 0335 (!) 334 lb (151.5 kg)     Height 03/10/19 0335 5\' 6"  (1.676 m)   Constitutional: Alert and oriented. Well appearing and in no acute distress. Eyes: Conjunctivae are normal. Head: Atraumatic. Nose: No congestion/rhinnorhea. Mouth/Throat: Mucous membranes are moist.   Neck: No stridor.  Cardiovascular: Tachycardia. Good peripheral circulation. Grossly normal heart sounds.   Respiratory: Normal respiratory effort.  No retractions. Lungs CTAB. Gastrointestinal: Soft and nontender. No distention.  Musculoskeletal: No lower extremity tenderness nor edema. No gross deformities of extremities. Neurologic:  Normal speech and language. No gross focal neurologic deficits are appreciated.  Skin:  Skin is warm, dry and intact. No rash noted.   ____________________________________________   LABS (all labs ordered are listed, but only abnormal results are displayed)  Labs Reviewed  BASIC METABOLIC PANEL - Abnormal; Notable for the following components:      Result Value   Glucose, Bld 437 (*)    BUN 21 (*)    Creatinine, Ser 1.42 (*)    Calcium 8.8 (*)    GFR calc non Af Amer 42 (*)    GFR calc Af Amer 48 (*)    All other components within normal limits  CBC - Abnormal; Notable for the following components:    WBC 14.7 (*)    RBC 3.79 (*)    Hemoglobin 10.8 (*)    HCT 35.3 (*)    All other components within normal limits  TROPONIN I (HIGH SENSITIVITY) - Abnormal; Notable for the following components:   Troponin I (High Sensitivity) 28 (*)    All other components within normal limits  TROPONIN I (HIGH SENSITIVITY) - Abnormal; Notable for the following components:   Troponin I (High Sensitivity) 30 (*)    All other components within normal limits  HEPATIC FUNCTION PANEL  LIPASE, BLOOD   ____________________________________________  EKG   EKG Interpretation  Date/Time:  Saturday March 10 2019 03:36:36 EST Ventricular Rate:  117 PR Interval:    QRS Duration: 81 QT Interval:  346 QTC Calculation: 483 R Axis:   -28 Text Interpretation: Sinus tachycardia Left ventricular hypertrophy No significant change was found Confirmed by Molpus, John [phone]) on 03/10/2019 3:44:40 AM       ____________________________________________  RADIOLOGY  DG Chest 2 View  Result Date: 03/10/2019 CLINICAL DATA:  Chest pain EXAM: CHEST - 2 VIEW COMPARISON:  02/20/2019 FINDINGS: The  heart size and mediastinal contours are within normal limits. Both lungs are clear. The visualized skeletal structures are unremarkable. IMPRESSION: No active cardiopulmonary disease. Electronically Signed   By: Donavan Foil M.D.   On: 03/10/2019 03:58    ____________________________________________   PROCEDURES  Procedure(s) performed:   Procedures  None ____________________________________________   INITIAL IMPRESSION / ASSESSMENT AND PLAN / ED COURSE  Pertinent labs & imaging results that were available during my care of the patient were reviewed by me and considered in my medical decision making (see chart for details).   Patient presents to the emergency department with nausea/vomiting.  She has history reported in the chart of gastroparesis but according to her last discharge summary she has had a negative  gastric emptying study bringing this diagnosis into question.  Patient is here today with similar pain symptoms and vomiting consistent with prior vomiting syndrome.   Labs and imaging reviewed. Troponin slightly elevated as with prior encounters. Troponin trended with no significant increase. Patient feeling improved on reassessment. Will discharge home with supportive care meds.  ____________________________________________  FINAL CLINICAL IMPRESSION(S) / ED DIAGNOSES  Final diagnoses:  Non-intractable vomiting with nausea, unspecified vomiting type  Generalized abdominal pain  Precordial chest pain     MEDICATIONS GIVEN DURING THIS VISIT:  Medications  morphine 4 MG/ML injection 4 mg (4 mg Intravenous Given 03/10/19 0930)  ondansetron (ZOFRAN-ODT) disintegrating tablet 4 mg (4 mg Oral Given 03/10/19 0930)     NEW OUTPATIENT MEDICATIONS STARTED DURING THIS VISIT:  Discharge Medication List as of 03/10/2019 11:48 AM    START taking these medications   Details  dicyclomine (BENTYL) 20 MG tablet Take 1 tablet (20 mg total) by mouth 3 (three) times daily as needed for spasms (abdominal cramping)., Starting Sat 03/10/2019, Print        Note:  This document was prepared using Dragon voice recognition software and may include unintentional dictation errors.  Nanda Quinton, MD, [facility]    Kippy Gohman, [facility], MD 03/10/19 2005

## 2019-03-10 NOTE — ED Notes (Signed)
Pt car keys given to husband.

## 2019-03-10 NOTE — ED Triage Notes (Signed)
Pt reports chest pain along with abdominal pain for the last day.

## 2019-03-10 NOTE — ED Notes (Signed)
Attempt once to collect labs unsuccessful  

## 2019-03-10 NOTE — ED Notes (Signed)
An After Visit Summary was printed and given to the patient. Discharge instructions and prescriptions reviewed with patient and patient has no further questions at this time.

## 2019-03-11 ENCOUNTER — Encounter (HOSPITAL_COMMUNITY): Payer: Self-pay | Admitting: Emergency Medicine

## 2019-03-11 ENCOUNTER — Inpatient Hospital Stay (HOSPITAL_COMMUNITY)
Admission: EM | Admit: 2019-03-11 | Discharge: 2019-03-17 | DRG: 073 | Disposition: A | Discharge status: Home / Self Care | Payer: Medicare Other | Attending: Family Medicine | Admitting: Family Medicine

## 2019-03-11 ENCOUNTER — Emergency Department (HOSPITAL_COMMUNITY): Payer: Medicare Other

## 2019-03-11 ENCOUNTER — Other Ambulatory Visit: Payer: Self-pay

## 2019-03-11 DIAGNOSIS — R05 Cough: Secondary | ICD-10-CM

## 2019-03-11 DIAGNOSIS — Z6841 Body Mass Index (BMI) 40.0 and over, adult: Secondary | ICD-10-CM

## 2019-03-11 DIAGNOSIS — Z888 Allergy status to other drugs, medicaments and biological substances status: Secondary | ICD-10-CM

## 2019-03-11 DIAGNOSIS — E876 Hypokalemia: Secondary | ICD-10-CM | POA: Diagnosis not present

## 2019-03-11 DIAGNOSIS — Z880 Allergy status to penicillin: Secondary | ICD-10-CM

## 2019-03-11 DIAGNOSIS — R059 Cough, unspecified: Secondary | ICD-10-CM

## 2019-03-11 DIAGNOSIS — Z794 Long term (current) use of insulin: Secondary | ICD-10-CM

## 2019-03-11 DIAGNOSIS — I480 Paroxysmal atrial fibrillation: Secondary | ICD-10-CM | POA: Diagnosis present

## 2019-03-11 DIAGNOSIS — Z8711 Personal history of peptic ulcer disease: Secondary | ICD-10-CM

## 2019-03-11 DIAGNOSIS — N182 Chronic kidney disease, stage 2 (mild): Secondary | ICD-10-CM | POA: Diagnosis present

## 2019-03-11 DIAGNOSIS — IMO0002 Reserved for concepts with insufficient information to code with codable children: Secondary | ICD-10-CM

## 2019-03-11 DIAGNOSIS — N179 Acute kidney failure, unspecified: Secondary | ICD-10-CM

## 2019-03-11 DIAGNOSIS — B9620 Unspecified Escherichia coli [E. coli] as the cause of diseases classified elsewhere: Secondary | ICD-10-CM | POA: Diagnosis present

## 2019-03-11 DIAGNOSIS — Z91041 Radiographic dye allergy status: Secondary | ICD-10-CM

## 2019-03-11 DIAGNOSIS — M109 Gout, unspecified: Secondary | ICD-10-CM | POA: Diagnosis present

## 2019-03-11 DIAGNOSIS — Z20822 Contact with and (suspected) exposure to covid-19: Secondary | ICD-10-CM | POA: Diagnosis present

## 2019-03-11 DIAGNOSIS — I130 Hypertensive heart and chronic kidney disease with heart failure and stage 1 through stage 4 chronic kidney disease, or unspecified chronic kidney disease: Secondary | ICD-10-CM | POA: Diagnosis present

## 2019-03-11 DIAGNOSIS — E785 Hyperlipidemia, unspecified: Secondary | ICD-10-CM | POA: Diagnosis present

## 2019-03-11 DIAGNOSIS — J302 Other seasonal allergic rhinitis: Secondary | ICD-10-CM | POA: Diagnosis present

## 2019-03-11 DIAGNOSIS — E1122 Type 2 diabetes mellitus with diabetic chronic kidney disease: Secondary | ICD-10-CM | POA: Diagnosis present

## 2019-03-11 DIAGNOSIS — E860 Dehydration: Secondary | ICD-10-CM | POA: Diagnosis present

## 2019-03-11 DIAGNOSIS — D251 Intramural leiomyoma of uterus: Secondary | ICD-10-CM | POA: Diagnosis present

## 2019-03-11 DIAGNOSIS — E1140 Type 2 diabetes mellitus with diabetic neuropathy, unspecified: Secondary | ICD-10-CM | POA: Diagnosis present

## 2019-03-11 DIAGNOSIS — Z8719 Personal history of other diseases of the digestive system: Secondary | ICD-10-CM

## 2019-03-11 DIAGNOSIS — E1165 Type 2 diabetes mellitus with hyperglycemia: Secondary | ICD-10-CM | POA: Diagnosis present

## 2019-03-11 DIAGNOSIS — K219 Gastro-esophageal reflux disease without esophagitis: Secondary | ICD-10-CM | POA: Diagnosis present

## 2019-03-11 DIAGNOSIS — E1143 Type 2 diabetes mellitus with diabetic autonomic (poly)neuropathy: Secondary | ICD-10-CM | POA: Diagnosis present

## 2019-03-11 DIAGNOSIS — I10 Essential (primary) hypertension: Secondary | ICD-10-CM | POA: Diagnosis present

## 2019-03-11 DIAGNOSIS — Z886 Allergy status to analgesic agent status: Secondary | ICD-10-CM

## 2019-03-11 DIAGNOSIS — I509 Heart failure, unspecified: Secondary | ICD-10-CM

## 2019-03-11 DIAGNOSIS — N390 Urinary tract infection, site not specified: Secondary | ICD-10-CM | POA: Diagnosis present

## 2019-03-11 DIAGNOSIS — N184 Chronic kidney disease, stage 4 (severe): Secondary | ICD-10-CM | POA: Diagnosis present

## 2019-03-11 DIAGNOSIS — R112 Nausea with vomiting, unspecified: Secondary | ICD-10-CM

## 2019-03-11 DIAGNOSIS — Z8674 Personal history of sudden cardiac arrest: Secondary | ICD-10-CM

## 2019-03-11 DIAGNOSIS — N1832 Chronic kidney disease, stage 3b: Secondary | ICD-10-CM | POA: Diagnosis present

## 2019-03-11 DIAGNOSIS — F419 Anxiety disorder, unspecified: Secondary | ICD-10-CM | POA: Diagnosis present

## 2019-03-11 DIAGNOSIS — I69351 Hemiplegia and hemiparesis following cerebral infarction affecting right dominant side: Secondary | ICD-10-CM

## 2019-03-11 DIAGNOSIS — Z79899 Other long term (current) drug therapy: Secondary | ICD-10-CM

## 2019-03-11 DIAGNOSIS — K3184 Gastroparesis: Secondary | ICD-10-CM | POA: Diagnosis present

## 2019-03-11 DIAGNOSIS — J9601 Acute respiratory failure with hypoxia: Secondary | ICD-10-CM | POA: Diagnosis not present

## 2019-03-11 DIAGNOSIS — Z885 Allergy status to narcotic agent status: Secondary | ICD-10-CM

## 2019-03-11 DIAGNOSIS — I5023 Acute on chronic systolic (congestive) heart failure: Secondary | ICD-10-CM | POA: Diagnosis not present

## 2019-03-11 LAB — CBC
HCT: 33.2 % — ABNORMAL LOW (ref 36.0–46.0)
Hemoglobin: 10.2 g/dL — ABNORMAL LOW (ref 12.0–15.0)
MCH: 28.7 pg (ref 26.0–34.0)
MCHC: 30.7 g/dL (ref 30.0–36.0)
MCV: 93.5 fL (ref 80.0–100.0)
Platelets: 390 10*3/uL (ref 150–400)
RBC: 3.55 MIL/uL — ABNORMAL LOW (ref 3.87–5.11)
RDW: 14.3 % (ref 11.5–15.5)
WBC: 13.2 10*3/uL — ABNORMAL HIGH (ref 4.0–10.5)
nRBC: 0 % (ref 0.0–0.2)

## 2019-03-11 LAB — I-STAT BETA HCG BLOOD, ED (MC, WL, AP ONLY): I-stat hCG, quantitative: <5 m[IU]/mL (ref <5)

## 2019-03-11 LAB — COMPREHENSIVE METABOLIC PANEL
ALT: 16 U/L (ref 0–44)
AST: 16 U/L (ref 15–41)
Albumin: 3.1 g/dL — ABNORMAL LOW (ref 3.5–5.0)
Alkaline Phosphatase: 89 U/L (ref 38–126)
Anion gap: 15 (ref 5–15)
BUN: 18 mg/dL (ref 6–20)
CO2: 21 mmol/L — ABNORMAL LOW (ref 22–32)
Calcium: 8.5 mg/dL — ABNORMAL LOW (ref 8.9–10.3)
Chloride: 99 mmol/L (ref 98–111)
Creatinine, Ser: 1.34 mg/dL — ABNORMAL HIGH (ref 0.44–1.00)
GFR calc Af Amer: 52 mL/min — ABNORMAL LOW (ref >60)
GFR calc non Af Amer: 45 mL/min — ABNORMAL LOW (ref >60)
Glucose, Bld: 451 mg/dL — ABNORMAL HIGH (ref 70–99)
Potassium: 3.5 mmol/L (ref 3.5–5.1)
Sodium: 135 mmol/L (ref 135–145)
Total Bilirubin: 0.6 mg/dL (ref 0.3–1.2)
Total Protein: 7 g/dL (ref 6.5–8.1)

## 2019-03-11 LAB — LIPASE, BLOOD: Lipase: 23 U/L (ref 11–51)

## 2019-03-11 LAB — TROPONIN I (HIGH SENSITIVITY): Troponin I (High Sensitivity): 50 ng/L — ABNORMAL HIGH (ref <18)

## 2019-03-11 MED ORDER — SODIUM CHLORIDE 0.9% FLUSH
3.0000 mL | Freq: Once | INTRAVENOUS | Status: AC
  Administered 2019-03-16: 3 mL via INTRAVENOUS

## 2019-03-11 NOTE — ED Triage Notes (Signed)
C/o pain to center of chest, mid abd pain, SOB, nausea, and vomiting since yesterday.

## 2019-03-11 NOTE — ED Notes (Signed)
Pt stated she cannot provide a urine sample at this time.

## 2019-03-12 DIAGNOSIS — E1165 Type 2 diabetes mellitus with hyperglycemia: Secondary | ICD-10-CM | POA: Diagnosis not present

## 2019-03-12 DIAGNOSIS — N179 Acute kidney failure, unspecified: Secondary | ICD-10-CM | POA: Diagnosis not present

## 2019-03-12 DIAGNOSIS — R112 Nausea with vomiting, unspecified: Secondary | ICD-10-CM | POA: Diagnosis not present

## 2019-03-12 DIAGNOSIS — K3184 Gastroparesis: Secondary | ICD-10-CM | POA: Diagnosis not present

## 2019-03-12 LAB — CBC WITH DIFFERENTIAL/PLATELET
Abs Immature Granulocytes: 0.1 10*3/uL — ABNORMAL HIGH (ref 0.00–0.07)
Basophils Absolute: 0.1 10*3/uL (ref 0.0–0.1)
Basophils Relative: 1 %
Eosinophils Absolute: 0 10*3/uL (ref 0.0–0.5)
Eosinophils Relative: 0 %
HCT: 34.3 % — ABNORMAL LOW (ref 36.0–46.0)
Hemoglobin: 10.7 g/dL — ABNORMAL LOW (ref 12.0–15.0)
Immature Granulocytes: 1 %
Lymphocytes Relative: 15 %
Lymphs Abs: 2.2 10*3/uL (ref 0.7–4.0)
MCH: 28.3 pg (ref 26.0–34.0)
MCHC: 31.2 g/dL (ref 30.0–36.0)
MCV: 90.7 fL (ref 80.0–100.0)
Monocytes Absolute: 0.8 10*3/uL (ref 0.1–1.0)
Monocytes Relative: 5 %
Neutro Abs: 11.5 10*3/uL — ABNORMAL HIGH (ref 1.7–7.7)
Neutrophils Relative %: 78 %
Platelets: 374 10*3/uL (ref 150–400)
RBC: 3.78 MIL/uL — ABNORMAL LOW (ref 3.87–5.11)
RDW: 14.2 % (ref 11.5–15.5)
WBC: 14.7 10*3/uL — ABNORMAL HIGH (ref 4.0–10.5)
nRBC: 0 % (ref 0.0–0.2)

## 2019-03-12 LAB — COMPREHENSIVE METABOLIC PANEL
ALT: 16 U/L (ref 0–44)
AST: 13 U/L — ABNORMAL LOW (ref 15–41)
Albumin: 3.3 g/dL — ABNORMAL LOW (ref 3.5–5.0)
Alkaline Phosphatase: 94 U/L (ref 38–126)
Anion gap: 16 — ABNORMAL HIGH (ref 5–15)
BUN: 24 mg/dL — ABNORMAL HIGH (ref 6–20)
CO2: 21 mmol/L — ABNORMAL LOW (ref 22–32)
Calcium: 8.6 mg/dL — ABNORMAL LOW (ref 8.9–10.3)
Chloride: 95 mmol/L — ABNORMAL LOW (ref 98–111)
Creatinine, Ser: 1.82 mg/dL — ABNORMAL HIGH (ref 0.44–1.00)
GFR calc Af Amer: 36 mL/min — ABNORMAL LOW (ref >60)
GFR calc non Af Amer: 31 mL/min — ABNORMAL LOW (ref >60)
Glucose, Bld: 430 mg/dL — ABNORMAL HIGH (ref 70–99)
Potassium: 3.7 mmol/L (ref 3.5–5.1)
Sodium: 132 mmol/L — ABNORMAL LOW (ref 135–145)
Total Bilirubin: 0.8 mg/dL (ref 0.3–1.2)
Total Protein: 7.3 g/dL (ref 6.5–8.1)

## 2019-03-12 LAB — CBG MONITORING, ED: Glucose-Capillary: 353 mg/dL — ABNORMAL HIGH (ref 70–99)

## 2019-03-12 LAB — URINALYSIS, ROUTINE W REFLEX MICROSCOPIC
Bilirubin Urine: NEGATIVE
Glucose, UA: >=500 mg/dL — AB
Ketones, ur: NEGATIVE mg/dL
Leukocytes,Ua: NEGATIVE
Nitrite: NEGATIVE
Protein, ur: >=300 mg/dL — AB
Specific Gravity, Urine: 1.017 (ref 1.005–1.030)
pH: 5 (ref 5.0–8.0)

## 2019-03-12 LAB — GLUCOSE, CAPILLARY
Glucose-Capillary: 258 mg/dL — ABNORMAL HIGH (ref 70–99)
Glucose-Capillary: 360 mg/dL — ABNORMAL HIGH (ref 70–99)
Glucose-Capillary: 84 mg/dL (ref 70–99)

## 2019-03-12 LAB — TROPONIN I (HIGH SENSITIVITY)
Troponin I (High Sensitivity): 56 ng/L — ABNORMAL HIGH (ref <18)
Troponin I (High Sensitivity): 52 ng/L — ABNORMAL HIGH (ref <18)

## 2019-03-12 LAB — BRAIN NATRIURETIC PEPTIDE: B Natriuretic Peptide: 76.8 pg/mL (ref 0.0–100.0)

## 2019-03-12 LAB — SARS CORONAVIRUS 2 (TAT 6-24 HRS): SARS Coronavirus 2: NEGATIVE

## 2019-03-12 MED ORDER — LORATADINE 10 MG PO TABS
10.0000 mg | ORAL_TABLET | Freq: Every day | ORAL | Status: DC
  Administered 2019-03-13 – 2019-03-17 (×5): 10 mg via ORAL
  Filled 2019-03-12 (×5): qty 1

## 2019-03-12 MED ORDER — HYDRALAZINE HCL 25 MG PO TABS: 25.0000 mg | ORAL_TABLET | Freq: Three times a day (TID) | ORAL | Status: DC

## 2019-03-12 MED ORDER — MORPHINE SULFATE (PF) 2 MG/ML IV SOLN
2.0000 mg | INTRAVENOUS | Status: DC | PRN
  Administered 2019-03-12 – 2019-03-13 (×3): 2 mg via INTRAVENOUS
  Filled 2019-03-12 (×3): qty 1

## 2019-03-12 MED ORDER — ONDANSETRON HCL 4 MG/2ML IJ SOLN
4.0000 mg | Freq: Once | INTRAMUSCULAR | Status: AC
  Administered 2019-03-12: 4 mg via INTRAVENOUS
  Filled 2019-03-12: qty 2

## 2019-03-12 MED ORDER — MORPHINE SULFATE (PF) 4 MG/ML IV SOLN
4.0000 mg | Freq: Once | INTRAVENOUS | Status: AC
  Administered 2019-03-12: 10:00:00 4 mg via INTRAVENOUS
  Filled 2019-03-12: qty 1

## 2019-03-12 MED ORDER — METOPROLOL TARTRATE 5 MG/5ML IV SOLN: 5.0000 mg | Freq: Once | INTRAVENOUS | Status: DC

## 2019-03-12 MED ORDER — METOPROLOL SUCCINATE ER 25 MG PO TB24: 25.0000 mg | ORAL_TABLET | Freq: Every day | ORAL | Status: DC

## 2019-03-12 MED ORDER — INSULIN ASPART 100 UNIT/ML ~~LOC~~ SOLN
0.0000 [IU] | SUBCUTANEOUS | Status: DC
  Administered 2019-03-12: 20 [IU] via SUBCUTANEOUS
  Administered 2019-03-12: 11 [IU] via SUBCUTANEOUS
  Administered 2019-03-13: 3 [IU] via SUBCUTANEOUS

## 2019-03-12 MED ORDER — SODIUM CHLORIDE 0.9 % IV BOLUS
1000.0000 mL | Freq: Once | INTRAVENOUS | Status: AC
  Administered 2019-03-12: 10:00:00 1000 mL via INTRAVENOUS

## 2019-03-12 MED ORDER — METOCLOPRAMIDE HCL 5 MG/ML IJ SOLN
10.0000 mg | Freq: Four times a day (QID) | INTRAMUSCULAR | Status: DC | PRN
  Administered 2019-03-14: 10 mg via INTRAVENOUS
  Filled 2019-03-12 (×2): qty 2

## 2019-03-12 MED ORDER — SODIUM CHLORIDE 0.9 % IV BOLUS
1000.0000 mL | Freq: Once | INTRAVENOUS | Status: AC
  Administered 2019-03-12: 13:00:00 1000 mL via INTRAVENOUS

## 2019-03-12 MED ORDER — INSULIN GLARGINE 100 UNIT/ML ~~LOC~~ SOLN
35.0000 [IU] | Freq: Every day | SUBCUTANEOUS | Status: DC
  Administered 2019-03-12: 35 [IU] via SUBCUTANEOUS
  Filled 2019-03-12 (×2): qty 0.35

## 2019-03-12 MED ORDER — SODIUM CHLORIDE 0.9% FLUSH: 10.0000 mL | INTRAVENOUS | Status: DC | PRN

## 2019-03-12 MED ORDER — ATORVASTATIN CALCIUM 10 MG PO TABS
10.0000 mg | ORAL_TABLET | Freq: Every day | ORAL | Status: DC
  Administered 2019-03-13 – 2019-03-17 (×5): 10 mg via ORAL
  Filled 2019-03-12 (×5): qty 1

## 2019-03-12 MED ORDER — INSULIN ASPART 100 UNIT/ML ~~LOC~~ SOLN: 0.0000 [IU] | Freq: Three times a day (TID) | SUBCUTANEOUS | Status: DC

## 2019-03-12 MED ORDER — MORPHINE SULFATE (PF) 4 MG/ML IV SOLN
4.0000 mg | Freq: Once | INTRAVENOUS | Status: AC
  Administered 2019-03-12: 4 mg via INTRAVENOUS
  Filled 2019-03-12: qty 1

## 2019-03-12 MED ORDER — DULOXETINE HCL 20 MG PO CPEP
40.0000 mg | ORAL_CAPSULE | Freq: Two times a day (BID) | ORAL | Status: DC
  Administered 2019-03-12 – 2019-03-17 (×10): 40 mg via ORAL
  Filled 2019-03-12 (×11): qty 2

## 2019-03-12 MED ORDER — FLUTICASONE PROPIONATE 50 MCG/ACT NA SUSP
2.0000 | Freq: Every day | NASAL | Status: DC | PRN
  Filled 2019-03-12: qty 16

## 2019-03-12 MED ORDER — NITROGLYCERIN 0.4 MG SL SUBL: 0.4000 mg | SUBLINGUAL_TABLET | SUBLINGUAL | Status: DC | PRN

## 2019-03-12 MED ORDER — SODIUM CHLORIDE 0.9 % IV SOLN: INTRAVENOUS | Status: DC

## 2019-03-12 MED ORDER — ONDANSETRON HCL 4 MG/2ML IJ SOLN
4.0000 mg | Freq: Once | INTRAMUSCULAR | Status: AC
  Administered 2019-03-12: 10:00:00 4 mg via INTRAVENOUS
  Filled 2019-03-12: qty 2

## 2019-03-12 MED ORDER — ALLOPURINOL 100 MG PO TABS
50.0000 mg | ORAL_TABLET | Freq: Every day | ORAL | Status: DC
  Administered 2019-03-13 – 2019-03-17 (×5): 50 mg via ORAL
  Filled 2019-03-12 (×5): qty 1

## 2019-03-12 MED ORDER — SODIUM CHLORIDE 0.9% FLUSH: 10.0000 mL | Freq: Two times a day (BID) | INTRAVENOUS | Status: DC

## 2019-03-12 MED ORDER — PANTOPRAZOLE SODIUM 40 MG PO TBEC
40.0000 mg | DELAYED_RELEASE_TABLET | Freq: Every day | ORAL | Status: DC
  Administered 2019-03-13 – 2019-03-14 (×2): 40 mg via ORAL
  Filled 2019-03-12 (×2): qty 1

## 2019-03-12 MED ORDER — ENOXAPARIN SODIUM 40 MG/0.4ML ~~LOC~~ SOLN
40.0000 mg | SUBCUTANEOUS | Status: DC
  Administered 2019-03-12: 40 mg via SUBCUTANEOUS
  Filled 2019-03-12: qty 0.4

## 2019-03-12 MED ORDER — METOPROLOL TARTRATE 5 MG/5ML IV SOLN
5.0000 mg | Freq: Three times a day (TID) | INTRAVENOUS | Status: DC
  Administered 2019-03-12 – 2019-03-13 (×2): 5 mg via INTRAVENOUS
  Filled 2019-03-12 (×2): qty 5

## 2019-03-12 NOTE — ED Notes (Signed)
Lab to add on BNP to previous sent down specimen.

## 2019-03-12 NOTE — ED Notes (Signed)
Pt advised that she needed assistance to the restroom. Pt wheeled to restroom. Pt also informed of need for urine sample. Pt could not salvage the urine in container as the urine spilled on the floor. This tech cleaned up the urine and called for EVS to mop floor.

## 2019-03-12 NOTE — H&P (Addendum)
[facility] Admission History and Physical Service Pager: [phone]  Patient name: Deborah [name] Medical record number: [id number] Date of birth: Sep 21, 1965 Age: 54 y.o. Gender: female  Primary Care Provider: [name], DO Consultants: None Code Status: Full  Chief Complaint: Vomiting/abdominal pain  Assessment and Plan: Deborah [name] is a 54 y.o. female presenting with nausea and vomiting. PMH is significant for gastroparesis, essential hypertension, type 2 diabetes, chronic kidney disease, gout, history of stroke,  Nausea/vomiting/abdominal pain likely secondary to gastroparesis Patient with 2 days of nausea, vomiting, abdominal and chest pain as well as some diarrhea.  Patient is tachycardic on exam with heart rates in the low 100s to 120s.  She is also afebrile with stable O2 sats and normotensive blood pressure.  Patient's abdomen is mildly tender to palpation and she states she feels very similar to her previous admissions for gastroparesis.  In the emergency department patient's labs showed elevated glucose of 451, creatinine 1.34.  Troponins trended flat at 50, 56, 52.  White blood cell count was slightly elevated at 13.2 with a hemoglobin of 10.2.  Urinalysis showed cloudy urine with greater than 500 glucose, moderate hemoglobin, negative leukocyte esterase, negative nitrite.  Chest x-ray was completed which showed mild cardiomegaly and low lung volumes without acute disease. S/p 2L NS bolus in ED.  Etiology: With patient's history this certainly seems consistent with prior admissions for gastroparesis.  Less likely ACS with stable troponins and no ischemic T wave changes. Patient w/ h/o HFrEF, no evidence of hypervolemia on exam or CXR. BNP pending  Patient does not have fevers, dysuria making UTI less likely for cause of her abdominal pain, nausea, vomiting.  Patient does not endorse other infectious symptoms such as congestion, cough.  She  does endorse some diarrhea but has only had 3 bouts in the past day.  Other causes for her abdominal pain can include gallbladder etiology, pancreatitis, renal stones, however these are inconsistent with her current presentation.  She denies pain in the right upper quadrant does not have pain consistent with renal stones.  Her lipase is within normal limits. -Admit to cardiac telemetry, attending Dr. Andria Frames - Reglan IV for patient's gastroparesis -Can change patient's current medications to IV if is unable to tolerate p.o. -Continuous telemetry and pulse ox -Vitals per floor protocol -Can change meds to IV if unable to take due to vomiting -Normal saline at 125 mL/h -Monitor CBGs -Daily BMP -We will check urine culture to rule out UTI -Can consider GI consult as patient has had several recent admissions for diabetic gastroparesis  Chest pain Patient with sharp chest pain, reproducible on exam.  States she feels this is similar to previous bouts of gastroparesis she has had.  Troponins stable.  No ischemic T wave changes on EKG.  Less likely ACS due to stable troponins and lack of ischemic T wave changes.  The fact that this is reproducible also supports this. -Continuous telemetry -Monitor chest pain - home nitro prn pain  Type 2 diabetes, hyperglycemia Most recent blood glucose of 430.  Patient with very mild anion gap 16.  Most recent A1c 9.6.  Home medications include 50units/day of Lantus as well as NovoLog 4 times per day.   -35 units Lantus -Resistant sliding scale every 4 hours -Monitor CBGs every 4 hours  AKI on CKD stage II, likely due to dehydration  Current creatinine 1.82 estimated GFR 36.  Creatinine since early 2019 seems to range from 1.0-1.5 per chart  review.  AKI likely secondary to dehydration. -Avoid nephrotoxic agents -Hold Lasix -Daily BMP -Normal saline at maintenance  Hypertension Blood pressure is normotensive with most recent 150/95.  Home medications include  metoprolol 25 mg, Lasix 20 mg, hydralazine 25 mg 3 times per day. -We will give patient IV metoprolol 5 mg every 8 hours for today -We will transition back to p.o. metoprolol if patient is able tomorrow -Can add hydralazine if needed for further blood pressure control  HFrEF Most recent echo shows 40-45% left ventricular ejection fraction with global hypokinesis.  Patient has a history of anaphylaxis to lisinopril.  Home medications include metoprolol succinate, Lasix 20 mg, hydralazine. -Holding lasix and hydralazine - Consider addition of Imdur or SGLT2 as outpatient.  Patient has had anaphylaxis to ACE inhibitor per chart review. -Daily weights -Monitor fluid status  Hyperlipidemia Takes 10 mg of atorvastatin -Continue home statin as patient is able  Pelvic Mass Noted in CT abdomen pelvis and October 2020.  5.1 x 4.5 cm left pelvic mass corresponding to an exophytic fibroid. Repeat imaging appears stable including CT ab/pelvis in 07/2018 and MRI in 2019 -Recommend outpatient follow-up  Gout Medications include allopurinol 100 mg -Have reduced allopurinol 50 mg due to current AKI/renal function  Anxiety Home medications include duloxetine 30 mg,  -Continue home duloxetine as patient is able  GERD Home medications include pantoprazole 40 mg daily -Continue pantoprazole  Seasonal allergy Home medications include cetirizine, Flonase -Continue home medications  FEN/GI: Full liquid, can advance as patient tolerates Prophylaxis: Lovenox  Disposition: Pending medical work-up, admit to cardiac telemetry  History of Present Illness:  Deborah [name] is a 54 y.o. female presenting with 2 days of chest paiin, diarrhea, vomiting, CP and ab pain.   Patient states that she has had approximately 3 watery stools since yesterday and has vomited approximately 15 times yesterday.  She states this feels very similar to previous admissions for gastroparesis.  She also endorses some chest  pain which she states is sharp, located in the middle of the chest, and is reproducible.  Patient also complains of some abdominal pain that she states is worse than her chest pain and seems to be localized all across her abdomen with no single area hurting worse than anywhere else.  States this pain is sharp in consistency and does not seem to improve or worsen with anything that she is noted.   Patient states she does not smoke or use tobacco products, she does not drink alcohol, and she does not use any illicit drugs.  In the emergency department patient was started on fluids and provided antiemetics.  Patient was in the emergency department for almost a full day and provided symptomatic treatment at this time.  Upon recheck of patient's BMP was noted that she had an AKI and was therefore admitted continued management of her vomiting, abdominal pain as well as her AKI.  Review Of Systems: Per HPI with the following additions:   Review of Systems  Constitutional: Negative for fever.  HENT: Negative for congestion and sore throat.   Eyes: Negative for blurred vision.  Respiratory: Positive for shortness of breath. Negative for cough.   Cardiovascular: Positive for chest pain and leg swelling.  Gastrointestinal: Positive for abdominal pain, diarrhea, nausea and vomiting.  Genitourinary: Negative for dysuria and frequency.  Musculoskeletal: Negative for myalgias.  Neurological: Negative for headaches.    Patient Active Problem List   Diagnosis Date Noted  . Fibroid uterus 02/23/2019  . Congestion  of nasal sinus 01/24/2019  . Chronic diastolic heart failure ([place]) 12/19/2018  . Intractable nausea and vomiting 12/17/2018  . Hypoxia 12/17/2018  . Hyperglycemia 12/17/2018  . Elevated troponin I level   . Vomiting 07/18/2018  . Abdominal pain 07/17/2018  . Hyperkalemia 07/17/2018  . Cardiac arrest ([place]) 11/29/2017  . Pelvic mass 11/29/2017  . Leukocytosis 11/29/2017  . Anxiety 11/29/2017   . Allergic reaction to contrast media, initial encounter 11/28/2017  . Palliative care encounter   . Back pain 03/19/2017  . Stroke (cerebrum) ([place]) 03/19/2017  . GERD (gastroesophageal reflux disease) 03/19/2017  . Depression 03/19/2017  . Obesity   . Urinary tract infection 08/16/2016  . Normocytic normochromic anemia 08/16/2016  . Gastroparesis 08/16/2016  . Intractable vomiting with nausea 06/17/2016  . Diabetic gastroparesis ([place]) 06/05/2016  . Gout 06/05/2016  . AKI (acute kidney injury) ([place]) 12/06/2015  . Chest pain 09/26/2015  . Hypokalemia 09/26/2015  . Hypomagnesemia 09/26/2015  . Nausea and vomiting 08/20/2015  . Gout flare 05/27/2015  . Lower abdominal pain 05/26/2015  . DKA (diabetic ketoacidoses) ([place]) 05/25/2015  . Uncontrolled type 2 diabetes mellitus with diabetic neuropathy, with long-term current use of insulin ([place]) 05/25/2015  . Type 2 diabetes mellitus with hyperglycemia, with long-term current use of insulin ([place]) 05/25/2015  . CKD (chronic kidney disease), stage II 05/25/2015  . Essential hypertension, benign 09/28/2013    Past Medical History: Past Medical History:  Diagnosis Date  . Acute back pain with sciatica, left   . Acute back pain with sciatica, right   . Anemia, unspecified   . Chest pain 12/2015  . Chronic pain   . Diabetes mellitus   . Esophageal reflux   . Fibromyalgia   . Gastric ulcer   . Gastroparesis   . Gout   . Hyperlipidemia   . Hypertension   . Lumbosacral stenosis   . Obesity   . Pneumonia   . [name]   . Stroke ([place]) 02/2011  . Vitamin B12 deficiency anemia     Past Surgical History: Past Surgical History:  Procedure Laterality Date  . CATARACT EXTRACTION  01/2014  . CHOLECYSTECTOMY      Social History: Social History   Tobacco Use  . Smoking status: Never Smoker  . Smokeless tobacco: Never Used  Substance Use Topics  . Alcohol use: No  . Drug use: No   Additional social history:    Please also refer to relevant sections of EMR.  Family History: Family History  Problem Relation Age of Onset  . Diabetes Mother   . Diabetes Father   . Heart disease Father   . Diabetes Sister   . Congestive Heart Failure Sister 50  . Diabetes Brother      Allergies and Medications: Allergies  Allergen Reactions  . Contrast Media [Iodinated Diagnostic Agents] Anaphylaxis    Cardiac arrest  . Diazepam [name]  . Isovue [Iopamidol] Anaphylaxis    Patient had seizure like activity and then code post 100 cc of isovue 300  . Lisinopril Anaphylaxis    Tongue and mouth swelling  . Penicillins Palpitations    Has patient had a PCN reaction causing immediate rash, facial/tongue/throat swelling, SOB or lightheadedness with hypotension: Yes, heart races Has patient had a PCN reaction causing severe rash involving mucus membranes or skin necrosis: No Has patient had a PCN reaction that required hospitalization: Yes  Has patient had a PCN reaction occurring within the last 10 years: No   .  Acetaminophen Nausea Only and Other (See Comments)    Irritates stomach ulcer  Abdominal pain  . Tolmetin Nausea Only    Other reaction(s): Other (See Comments) ULCER  . Aspirin Other (See Comments)    Irritates stomach ulcer   . Food Hives and Swelling    Carrots, ketchup   . Nsaids Other (See Comments)    ULCER  . Tramadol Nausea And Vomiting   No current facility-administered medications on file prior to encounter.   Current Outpatient Medications on File Prior to Encounter  Medication Sig Dispense Refill  . allopurinol (ZYLOPRIM) 100 MG tablet Take 1 tablet (100 mg total) by mouth daily. 30 tablet 0  . amLODipine (NORVASC) 10 MG tablet TAKE ONE TABLET BY MOUTH ONCE DAILY (AM) (Patient taking differently: Take 10 mg by mouth daily. ) 30 tablet 2  . atorvastatin (LIPITOR) 10 MG tablet Take 1 tablet (10 mg total) by mouth daily. 30 tablet 11  . cetirizine (ZYRTEC) 10 MG tablet  Take 1 tablet (10 mg total) by mouth daily. 30 tablet 11  . DULoxetine HCl 40 MG CPEP Take 40 mg by mouth 2 (two) times daily. 60 capsule 2  . fluticasone (FLONASE) 50 MCG/ACT nasal spray Place 2 sprays into both nostrils daily as needed for allergies or rhinitis. 16 g 2  . furosemide (LASIX) 20 MG tablet Take 1 tablet (20 mg total) by mouth daily. Hold for 2 days    . hydrALAZINE (APRESOLINE) 25 MG tablet TAKE 1 TABLET (25 MG TOTAL) BY MOUTH 3 (THREE) TIMES DAILY. FOR BLOOD PRESSURE 90 tablet 0  . insulin aspart (NOVOLOG) 100 UNIT/ML injection Inject 30 Units into the skin 4 (four) times daily -  with meals and at bedtime. 10 mL 11  . LANTUS SOLOSTAR 100 UNIT/ML Solostar Pen Inject 50 Units into the skin at bedtime.    [name] loperamide (IMODIUM) 2 MG capsule Take 2 capsules (4 mg total) by mouth as needed for diarrhea or loose stools. 30 capsule 0  . Melatonin 3 MG TABS Take 2 tablets (6 mg total) by mouth at bedtime. 30 tablet 6  . metoCLOPramide (REGLAN) 10 MG tablet Take 1 tablet (10 mg total) by mouth 3 (three) times daily before meals. 90 tablet 2  . metoprolol succinate (TOPROL-XL) 25 MG 24 hr tablet TAKE ONE TABLET BY MOUTH ONCE DAILY (Patient taking differently: Take 25 mg by mouth daily. ) 30 tablet 2  . nitroGLYCERIN (NITROSTAT) 0.4 MG SL tablet Place 1 tablet (0.4 mg total) under the tongue every 5 (five) minutes as needed for chest pain. 30 tablet 1  . pantoprazole (PROTONIX) 40 MG tablet Take 1 tablet (40 mg total) by mouth daily. 30 tablet 0  . azelastine (ASTELIN) 0.1 % nasal spray Place 2 sprays into both nostrils 2 (two) times daily. Use in each nostril as directed (Patient not taking: Reported on 03/12/2019) 30 mL 12  . blood glucose meter kit and supplies KIT Dispense based on patient and insurance preference. Use up to four times daily as directed. (FOR ICD-9 250.00, 250.01). 1 each 0  . dicyclomine (BENTYL) 20 MG tablet Take 1 tablet (20 mg total) by mouth 3 (three) times daily as  needed for spasms (abdominal cramping). (Patient not taking: Reported on 03/12/2019) 20 tablet 0  . ondansetron (ZOFRAN ODT) 4 MG disintegrating tablet Take 1 tablet (4 mg total) by mouth every 8 (eight) hours as needed for nausea or vomiting. (Patient not taking: Reported on 03/12/2019) 20 tablet 0  Objective: BP (!) 165/95   Pulse (!) 116   Temp 98.4 F (36.9 C) (Oral)   Resp (!) 25   LMP 10/10/2012   SpO2 99%  Exam: General: Alert and oriented, no apparent distress  Eyes: PERRLA ENTM: No pharyengeal erythema Cardiovascular: RRR with no murmurs noted Respiratory: CTA bilaterally  Gastrointestinal: Bowel sounds present.  Some diffuse abdominal discomfort to palpation, appears to be slightly more prevalent in the suprapubic area. MSK: Moves limbs appropriately Extremities: Some lower limb edema without pitting. Derm: No rashes noted Psych: Behavior and speech appropriate to situation   Labs and Imaging: CBC BMET  Recent Labs  Lab 03/12/19 0756  WBC 14.7*  HGB 10.7*  HCT 34.3*  PLT 374   Recent Labs  Lab 03/12/19 0756  NA 132*  K 3.7  CL 95*  CO2 21*  BUN 24*  CREATININE 1.82*  GLUCOSE 430*  CALCIUM 8.6*     No results found.  Bonnita Hollow, MD 03/12/2019, 1:38 PM PGY-3, [place] [facility] pager: [phone], text pages welcome

## 2019-03-12 NOTE — ED Provider Notes (Signed)
[facility] EMERGENCY DEPARTMENT Provider Note   [name]: [id number] Arrival date & time: 03/11/19  1406     History Chief Complaint  Patient presents with  . Chest Pain  . Abdominal Pain    Deborah [name] is a 54 y.o. female.  The history is provided by the patient and medical records. No language interpreter was used.  Emesis Severity:  Severe Duration:  4 days Timing:  Constant Quality:  Stomach contents Progression:  Worsening Chronicity:  Recurrent Recent urination:  Normal Relieved by:  Nothing Associated symptoms: abdominal pain and diarrhea   Associated symptoms: no chills, no cough, no fever, no headaches and no sore throat   Risk factors: diabetes        Past Medical History:  Diagnosis Date  . Acute back pain with sciatica, left   . Acute back pain with sciatica, right   . Anemia, unspecified   . Chest pain 12/2015  . Chronic pain   . Diabetes mellitus   . Esophageal reflux   . Fibromyalgia   . Gastric ulcer   . Gastroparesis   . Gout   . Hyperlipidemia   . Hypertension   . Lumbosacral stenosis   . Obesity   . Pneumonia   . [name]   . Stroke ([place]) 02/2011  . Vitamin B12 deficiency anemia     Patient Active Problem List   Diagnosis Date Noted  . Fibroid uterus 02/23/2019  . Congestion of nasal sinus 01/24/2019  . Chronic diastolic heart failure ([place]) 12/19/2018  . Intractable nausea and vomiting 12/17/2018  . Hypoxia 12/17/2018  . Hyperglycemia 12/17/2018  . Elevated troponin I level   . Vomiting 07/18/2018  . Abdominal pain 07/17/2018  . Hyperkalemia 07/17/2018  . Cardiac arrest ([place]) 11/29/2017  . Pelvic mass 11/29/2017  . Leukocytosis 11/29/2017  . Anxiety 11/29/2017  . Allergic reaction to contrast media, initial encounter 11/28/2017  . Palliative care encounter   . Back pain 03/19/2017  . Stroke (cerebrum) ([place]) 03/19/2017  . GERD (gastroesophageal reflux disease) 03/19/2017  . Depression  03/19/2017  . Obesity   . Urinary tract infection 08/16/2016  . Normocytic normochromic anemia 08/16/2016  . Gastroparesis 08/16/2016  . Intractable vomiting with nausea 06/17/2016  . Diabetic gastroparesis ([place]) 06/05/2016  . Gout 06/05/2016  . AKI (acute kidney injury) ([place]) 12/06/2015  . Chest pain 09/26/2015  . Hypokalemia 09/26/2015  . Hypomagnesemia 09/26/2015  . Nausea and vomiting 08/20/2015  . Gout flare 05/27/2015  . Lower abdominal pain 05/26/2015  . DKA (diabetic ketoacidoses) ([place]) 05/25/2015  . Uncontrolled type 2 diabetes mellitus with diabetic neuropathy, with long-term current use of insulin ([place]) 05/25/2015  . Type 2 diabetes mellitus with hyperglycemia, with long-term current use of insulin ([place]) 05/25/2015  . CKD (chronic kidney disease), stage II 05/25/2015  . Essential hypertension, benign 09/28/2013    Past Surgical History:  Procedure Laterality Date  . CATARACT EXTRACTION  01/2014  . CHOLECYSTECTOMY       OB History   No obstetric history on file.     Family History  Problem Relation Age of Onset  . Diabetes Mother   . Diabetes Father   . Heart disease Father   . Diabetes Sister   . Congestive Heart Failure Sister 38  . Diabetes Brother     Social History   Tobacco Use  . Smoking status: Never Smoker  . Smokeless tobacco: Never Used  Substance Use Topics  . Alcohol use: No  .  Drug use: No    Home Medications Prior to Admission medications   Medication Sig Start Date End Date Taking? Authorizing Provider  allopurinol (ZYLOPRIM) 100 MG tablet Take 1 tablet (100 mg total) by mouth daily. 09/23/15   Debbe Odea, MD  amLODipine (NORVASC) 10 MG tablet TAKE ONE TABLET BY MOUTH ONCE DAILY (AM) Patient taking differently: Take 10 mg by mouth daily.  03/05/19   [name], DO  atorvastatin (LIPITOR) 10 MG tablet Take 1 tablet (10 mg total) by mouth daily. 12/13/18   [name], DO  azelastine (ASTELIN) 0.1 % nasal spray Place 2  sprays into both nostrils 2 (two) times daily. Use in each nostril as directed 01/23/19   [name], DO  blood glucose meter kit and supplies KIT Dispense based on patient and insurance preference. Use up to four times daily as directed. (FOR ICD-9 250.00, 250.01). 12/13/18   [name], DO  cetirizine (ZYRTEC) 10 MG tablet Take 1 tablet (10 mg total) by mouth daily. 12/13/18   [name], DO  dicyclomine (BENTYL) 20 MG tablet Take 1 tablet (20 mg total) by mouth 3 (three) times daily as needed for spasms (abdominal cramping). 03/10/19   Long, Wonda Olds, MD  DULoxetine HCl 40 MG CPEP Take 40 mg by mouth 2 (two) times daily. 02/16/19 03/18/19  [name], DO  fluticasone (FLONASE) 50 MCG/ACT nasal spray Place 2 sprays into both nostrils daily as needed for allergies or rhinitis. 12/19/18   Rai, Vernelle Emerald, MD  furosemide (LASIX) 20 MG tablet Take 1 tablet (20 mg total) by mouth daily. Hold for 2 days 01/29/19   Milus Banister C, DO  hydrALAZINE (APRESOLINE) 25 MG tablet TAKE 1 TABLET (25 MG TOTAL) BY MOUTH 3 (THREE) TIMES DAILY. FOR BLOOD PRESSURE 03/05/19   [name], DO  insulin aspart (NOVOLOG) 100 UNIT/ML injection Inject 30 Units into the skin 4 (four) times daily -  with meals and at bedtime. Patient not taking: Reported on 03/10/2019 01/23/19   [name], DO  LANTUS SOLOSTAR 100 UNIT/ML Solostar Pen Inject 50 Units into the skin at bedtime. 01/18/19   [provider]  loperamide (IMODIUM) 2 MG capsule Take 2 capsules (4 mg total) by mouth as needed for diarrhea or loose stools. 01/29/19   Daisy Floro, DO  Melatonin 3 MG TABS Take 2 tablets (6 mg total) by mouth at bedtime. 12/13/18   [name], DO  metoCLOPramide (REGLAN) 10 MG tablet Take 1 tablet (10 mg total) by mouth 3 (three) times daily before meals. 12/19/18   Rai, Vernelle Emerald, MD  metoprolol succinate (TOPROL-XL) 25 MG 24 hr tablet TAKE ONE TABLET BY MOUTH ONCE DAILY 03/05/19    [name], DO  nitroGLYCERIN (NITROSTAT) 0.4 MG SL tablet Place 1 tablet (0.4 mg total) under the tongue every 5 (five) minutes as needed for chest pain. 12/13/18   Lockamy, Timothy, DO  NOVOLOG FLEXPEN 100 UNIT/ML FlexPen Inject 30 Units into the skin 4 (four) times daily.  03/05/19   [provider]  ondansetron (ZOFRAN ODT) 4 MG disintegrating tablet Take 1 tablet (4 mg total) by mouth every 8 (eight) hours as needed for nausea or vomiting. 03/10/19   Long, Wonda Olds, MD  pantoprazole (PROTONIX) 40 MG tablet Take 1 tablet (40 mg total) by mouth daily. 03/10/19 04/09/19  Margette Fast, MD    Allergies    Contrast media [iodinated diagnostic agents], Diazepam, Isovue [iopamidol], Lisinopril, Penicillins, Acetaminophen, Tolmetin, Aspirin, Food, Nsaids, and Tramadol  Review of Systems   Review of Systems  Constitutional: Negative for chills, diaphoresis, fatigue and fever.  HENT: Negative for congestion and sore throat.   Respiratory: Negative for cough, chest tightness, shortness of breath and wheezing.   Cardiovascular: Positive for chest pain. Negative for palpitations and leg swelling.  Gastrointestinal: Positive for abdominal pain, diarrhea, nausea and vomiting. Negative for constipation.  Genitourinary: Negative for dysuria and flank pain.  Musculoskeletal: Negative for back pain, neck pain and neck stiffness.  Skin: Negative for rash and wound.  Neurological: Negative for weakness, light-headedness and headaches.  Psychiatric/Behavioral: Negative for agitation.  All other systems reviewed and are negative.   Physical Exam Updated Vital Signs BP (!) 133/93 (BP Location: Left Arm)   Pulse (!) 122   Temp 98.4 F (36.9 C) (Oral)   Resp 18   LMP 10/10/2012   SpO2 100%   Physical Exam Vitals and nursing note reviewed.  Constitutional:      General: She is not in acute distress.    Appearance: She is well-developed. She is ill-appearing. She is not toxic-appearing or  diaphoretic.  HENT:     Head: Normocephalic and atraumatic.     Right Ear: External ear normal.     Left Ear: External ear normal.     Nose: Nose normal.     Mouth/Throat:     Pharynx: No oropharyngeal exudate.  Eyes:     Conjunctiva/sclera: Conjunctivae normal.     Pupils: Pupils are equal, round, and reactive to light.  Cardiovascular:     Rate and Rhythm: Regular rhythm. Tachycardia present.     Heart sounds: Normal heart sounds.  Pulmonary:     Effort: Pulmonary effort is normal. No tachypnea or respiratory distress.     Breath sounds: No stridor. No decreased breath sounds, wheezing, rhonchi or rales.  Chest:     Chest wall: Tenderness present.  Abdominal:     General: There is no distension.     Tenderness: There is no abdominal tenderness. There is no rebound.  Musculoskeletal:     Cervical back: Normal range of motion and neck supple.     Right lower leg: No tenderness. Edema present.     Left lower leg: No tenderness. Edema present.  Skin:    General: Skin is warm.     Capillary Refill: Capillary refill takes less than 2 seconds.     Findings: No erythema or rash.  Neurological:     General: No focal deficit present.     Mental Status: She is alert and oriented to person, place, and time.     Motor: No abnormal muscle tone.     Coordination: Coordination normal.     Deep Tendon Reflexes: Reflexes are normal and symmetric.     ED Results / Procedures / Treatments   Labs (all labs ordered are listed, but only abnormal results are displayed) Labs Reviewed  CBC - Abnormal; Notable for the following components:      Result Value   WBC 13.2 (*)    RBC 3.55 (*)    Hemoglobin 10.2 (*)    HCT 33.2 (*)    All other components within normal limits  COMPREHENSIVE METABOLIC PANEL - Abnormal; Notable for the following components:   CO2 21 (*)    Glucose, Bld 451 (*)    Creatinine, Ser 1.34 (*)    Calcium 8.5 (*)    Albumin 3.1 (*)    GFR calc non Af Amer 45 (*)  GFR calc Af Amer 52 (*)    All other components within normal limits  URINALYSIS, ROUTINE W REFLEX MICROSCOPIC - Abnormal; Notable for the following components:   APPearance CLOUDY (*)    Glucose, UA >=500 (*)    Hgb urine dipstick MODERATE (*)    Protein, ur >=300 (*)    Bacteria, UA FEW (*)    All other components within normal limits  CBC WITH DIFFERENTIAL/PLATELET - Abnormal; Notable for the following components:   WBC 14.7 (*)    RBC 3.78 (*)    Hemoglobin 10.7 (*)    HCT 34.3 (*)    Neutro Abs 11.5 (*)    Abs Immature Granulocytes 0.10 (*)    All other components within normal limits  COMPREHENSIVE METABOLIC PANEL - Abnormal; Notable for the following components:   Sodium 132 (*)    Chloride 95 (*)    CO2 21 (*)    Glucose, Bld 430 (*)    BUN 24 (*)    Creatinine, Ser 1.82 (*)    Calcium 8.6 (*)    Albumin 3.3 (*)    AST 13 (*)    GFR calc non Af Amer 31 (*)    GFR calc Af Amer 36 (*)    Anion gap 16 (*)    All other components within normal limits  TROPONIN I (HIGH SENSITIVITY) - Abnormal; Notable for the following components:   Troponin I (High Sensitivity) 50 (*)    All other components within normal limits  TROPONIN I (HIGH SENSITIVITY) - Abnormal; Notable for the following components:   Troponin I (High Sensitivity) 56 (*)    All other components within normal limits  TROPONIN I (HIGH SENSITIVITY) - Abnormal; Notable for the following components:   Troponin I (High Sensitivity) 52 (*)    All other components within normal limits  SARS CORONAVIRUS 2 (TAT 6-24 HRS)  URINE CULTURE  LIPASE, BLOOD  BRAIN NATRIURETIC PEPTIDE  I-STAT BETA HCG BLOOD, ED ([facility], [facility], [facility] ONLY)    EKG None  Radiology DG Chest 2 View  Result Date: 03/11/2019 CLINICAL DATA:  Chest pain.  [name].  Nausea. EXAM: CHEST - 2 VIEW COMPARISON:  03/10/2019 FINDINGS: Both views are degraded by patient body habitus. Midline trachea. Borderline cardiomegaly. Mediastinal contours  otherwise within normal limits. No pleural effusion or pneumothorax. Clear lungs. No congestive failure. Low lung volumes. IMPRESSION: Mild cardiomegaly and low lung volumes, without acute disease. Electronically Signed   By: Abigail Miyamoto M.D.   On: 03/11/2019 15:02    Procedures Procedures (including critical care time)  Medications Ordered in ED Medications  sodium chloride flush (NS) 0.9 % injection 3 mL (has no administration in time range)  allopurinol (ZYLOPRIM) tablet 50 mg (has no administration in time range)  atorvastatin (LIPITOR) tablet 10 mg (has no administration in time range)  nitroGLYCERIN (NITROSTAT) SL tablet 0.4 mg (has no administration in time range)  Insulin Glargine (LANTUS) Solostar Pen 35 Units (has no administration in time range)  DULoxetine HCl CPEP 40 mg (has no administration in time range)  pantoprazole (PROTONIX) EC tablet 40 mg (has no administration in time range)  metoCLOPramide (REGLAN) injection 10 mg (has no administration in time range)  loratadine (CLARITIN) tablet 10 mg (has no administration in time range)  fluticasone (FLONASE) 50 MCG/ACT nasal spray 2 spray (has no administration in time range)  enoxaparin (LOVENOX) injection 40 mg (has no administration in time range)  metoprolol succinate (TOPROL-XL) 24 hr tablet  25 mg (has no administration in time range)  0.9 %  sodium chloride infusion (has no administration in time range)  morphine 2 MG/ML injection 2 mg (has no administration in time range)  metoprolol tartrate (LOPRESSOR) injection 5 mg (has no administration in time range)  insulin aspart (novoLOG) injection 0-20 Units (has no administration in time range)  sodium chloride 0.9 % bolus 1,000 mL (1,000 mLs Intravenous New Bag/Given 03/12/19 0954)  morphine 4 MG/ML injection 4 mg (4 mg Intravenous Given 03/12/19 0953)  ondansetron (ZOFRAN) injection 4 mg (4 mg Intravenous Given 03/12/19 0953)  sodium chloride 0.9 % bolus 1,000 mL (0 mLs  Intravenous Stopped 03/12/19 1433)  morphine 4 MG/ML injection 4 mg (4 mg Intravenous Given 03/12/19 1236)  ondansetron (ZOFRAN) injection 4 mg (4 mg Intravenous Given 03/12/19 1236)    ED Course  I have reviewed the triage vital signs and the nursing notes.  Pertinent labs & imaging results that were available during my care of the patient were reviewed by me and considered in my medical decision making (see chart for details).    MDM Rules/Calculators/A&P                      Deborah [name] is a 54 y.o. female with a past medical history significant for diabetes, DKA history, CKD, gastroparesis, cardiac arrest, CHF, stroke, and GERD who presents with intractable nausea, vomiting, dehydration, abdominal pain, and chest pain.  She reports that she has had these symptoms ongoing for the last few days and came to the emergency department 2 days ago with similar symptoms.  During that visit, she was able to get her pain and nausea under control with pain and nausea medicine.  She reports no history of DVT or PE and reports has been worked up for this as well and was negative.  She suspects this is all a gastroparesis flare causing all of her pain and symptoms.  She reports that although she is able to go home 2 days ago, she does not think she will be able to go home this time.  She says that since discharge days ago, she has not been able to eat or drink anything and is constantly vomiting with the severe pain.  She has been in the waiting room for approximately 17-1/2 hours before I evaluated her this morning.  She denies any fevers, chills, cough, or urinary symptoms.  She does report she has had some diarrhea contributing to her feeling dehydrated.  She is having dry heaving in the exam room.  She reports her chest pain is worsened with her vomiting as is the abdominal pain.  On exam, chest is tender to palpation as is abdomen.  Bowel sounds were appreciated.  Patient has some edema in her legs  which she reports is chronic.  She denies any new trauma.  Good pulses in all extremities.  Patient is tachycardic around 130 on my evaluation and chart review shows that she has been over 120 for the last 17 hours in the waiting room.  Clinical history patient is dehydrated from the nausea vomiting and likely gastritis flare.  She had some lab work performed in triage yesterday which showed some electrolyte imbalance similar to prior.  Importantly, her creatinine is not more elevated and it does not appear that she is in DKA although she is hyperglycemic.  This blood work was from yesterday, will repeat now to see what her electrolyte and fluid derangements are.  We will also give her fluids, pain medicine, and nausea medicine.  Anticipate reassessing the patient however as she has now been 18 hours without eating or drinking with persistent tachycardia and symptoms, it would not give patient feeling better, anticipate admission for rehydration and treatment of intractable nausea vomiting and pain.  9:30 AM Since the patient has been in the waiting room, she has now developed acute kidney injury with her nausea vomiting and dehydration.  We will continue to monitor how she is doing with her fluids however I suspect this will lead to her requiring admission.  12:03 PM Patient was reassessed and she is still tachycardic having nausea vomiting and intolerance of p.o.  Given the now kidney injury and her continued intolerance of p.o., do not feel she is safe for discharge home as she is still miserable.  Will give more nausea medicine, pain medicine, and fluids, and will call for admission.   Final Clinical Impression(s) / ED Diagnoses Final diagnoses:  AKI (acute kidney injury) ([place])  Non-intractable vomiting with nausea, unspecified vomiting type    Rx / DC Orders ED Discharge Orders    None     Clinical Impression: 1. AKI (acute kidney injury) ([place])   2. Non-intractable vomiting with  nausea, unspecified vomiting type     Disposition: Admit  This note was prepared with assistance of Dragon voice recognition software. Occasional wrong-word or sound-a-like substitutions may have occurred due to the inherent limitations of voice recognition software.     Katai Marsico, Gwenyth Allegra, MD 03/12/19 [phone]

## 2019-03-12 NOTE — Progress Notes (Signed)
New Admission Note:   Arrival Method:Bed  Mental Orientation: Alert and oriented  Telemetry:  Assessment: Completed Skin: Intact Dry  IV: midline Right  Pain: 6/10  Tubes: none  Safety Measures: Safety Fall Prevention Plan has been given, discussed and signed Admission: Completed [facility] Orientation: Patient has been orientated to the room, unit and staff.  Family: none   Orders have been reviewed and implemented. Will continue to monitor the patient. Call light has been placed within reach and bed alarm has been activated.   Jozelynn Danielson RN [facility] Phone: [phone]

## 2019-03-12 NOTE — ED Notes (Signed)
Unable to obtain IV access. 2 RN attempts. IV team consult ordered. Dr. Sherry Ruffing made aware.

## 2019-03-13 DIAGNOSIS — E1122 Type 2 diabetes mellitus with diabetic chronic kidney disease: Secondary | ICD-10-CM | POA: Diagnosis present

## 2019-03-13 DIAGNOSIS — N390 Urinary tract infection, site not specified: Secondary | ICD-10-CM | POA: Diagnosis present

## 2019-03-13 DIAGNOSIS — E1165 Type 2 diabetes mellitus with hyperglycemia: Secondary | ICD-10-CM

## 2019-03-13 DIAGNOSIS — R079 Chest pain, unspecified: Secondary | ICD-10-CM

## 2019-03-13 DIAGNOSIS — I480 Paroxysmal atrial fibrillation: Secondary | ICD-10-CM | POA: Diagnosis present

## 2019-03-13 DIAGNOSIS — I130 Hypertensive heart and chronic kidney disease with heart failure and stage 1 through stage 4 chronic kidney disease, or unspecified chronic kidney disease: Secondary | ICD-10-CM | POA: Diagnosis present

## 2019-03-13 DIAGNOSIS — I10 Essential (primary) hypertension: Secondary | ICD-10-CM | POA: Diagnosis not present

## 2019-03-13 DIAGNOSIS — K219 Gastro-esophageal reflux disease without esophagitis: Secondary | ICD-10-CM | POA: Diagnosis present

## 2019-03-13 DIAGNOSIS — R112 Nausea with vomiting, unspecified: Secondary | ICD-10-CM

## 2019-03-13 DIAGNOSIS — Z20822 Contact with and (suspected) exposure to covid-19: Secondary | ICD-10-CM | POA: Diagnosis present

## 2019-03-13 DIAGNOSIS — I509 Heart failure, unspecified: Secondary | ICD-10-CM | POA: Diagnosis not present

## 2019-03-13 DIAGNOSIS — E1140 Type 2 diabetes mellitus with diabetic neuropathy, unspecified: Secondary | ICD-10-CM | POA: Diagnosis present

## 2019-03-13 DIAGNOSIS — Z8711 Personal history of peptic ulcer disease: Secondary | ICD-10-CM

## 2019-03-13 DIAGNOSIS — Z8719 Personal history of other diseases of the digestive system: Secondary | ICD-10-CM

## 2019-03-13 DIAGNOSIS — N179 Acute kidney failure, unspecified: Secondary | ICD-10-CM | POA: Diagnosis present

## 2019-03-13 DIAGNOSIS — K3184 Gastroparesis: Secondary | ICD-10-CM

## 2019-03-13 DIAGNOSIS — I429 Cardiomyopathy, unspecified: Secondary | ICD-10-CM

## 2019-03-13 DIAGNOSIS — N182 Chronic kidney disease, stage 2 (mild): Secondary | ICD-10-CM | POA: Diagnosis present

## 2019-03-13 DIAGNOSIS — B9620 Unspecified Escherichia coli [E. coli] as the cause of diseases classified elsewhere: Secondary | ICD-10-CM | POA: Diagnosis present

## 2019-03-13 DIAGNOSIS — F419 Anxiety disorder, unspecified: Secondary | ICD-10-CM | POA: Diagnosis present

## 2019-03-13 DIAGNOSIS — R05 Cough: Secondary | ICD-10-CM | POA: Diagnosis not present

## 2019-03-13 DIAGNOSIS — Z6841 Body Mass Index (BMI) 40.0 and over, adult: Secondary | ICD-10-CM | POA: Diagnosis not present

## 2019-03-13 DIAGNOSIS — E860 Dehydration: Secondary | ICD-10-CM | POA: Diagnosis present

## 2019-03-13 DIAGNOSIS — I69351 Hemiplegia and hemiparesis following cerebral infarction affecting right dominant side: Secondary | ICD-10-CM | POA: Diagnosis not present

## 2019-03-13 DIAGNOSIS — Z794 Long term (current) use of insulin: Secondary | ICD-10-CM

## 2019-03-13 DIAGNOSIS — I5023 Acute on chronic systolic (congestive) heart failure: Secondary | ICD-10-CM | POA: Diagnosis not present

## 2019-03-13 DIAGNOSIS — M109 Gout, unspecified: Secondary | ICD-10-CM | POA: Diagnosis present

## 2019-03-13 DIAGNOSIS — E876 Hypokalemia: Secondary | ICD-10-CM | POA: Diagnosis not present

## 2019-03-13 DIAGNOSIS — E1143 Type 2 diabetes mellitus with diabetic autonomic (poly)neuropathy: Secondary | ICD-10-CM | POA: Diagnosis present

## 2019-03-13 DIAGNOSIS — E785 Hyperlipidemia, unspecified: Secondary | ICD-10-CM | POA: Diagnosis present

## 2019-03-13 DIAGNOSIS — J9601 Acute respiratory failure with hypoxia: Secondary | ICD-10-CM | POA: Diagnosis not present

## 2019-03-13 LAB — BASIC METABOLIC PANEL
Anion gap: 10 (ref 5–15)
Anion gap: 11 (ref 5–15)
BUN: 24 mg/dL — ABNORMAL HIGH (ref 6–20)
BUN: 25 mg/dL — ABNORMAL HIGH (ref 6–20)
CO2: 24 mmol/L (ref 22–32)
CO2: 21 mmol/L — ABNORMAL LOW (ref 22–32)
Calcium: 7.8 mg/dL — ABNORMAL LOW (ref 8.9–10.3)
Calcium: 7.4 mg/dL — ABNORMAL LOW (ref 8.9–10.3)
Chloride: 105 mmol/L (ref 98–111)
Chloride: 103 mmol/L (ref 98–111)
Creatinine, Ser: 1.92 mg/dL — ABNORMAL HIGH (ref 0.44–1.00)
Creatinine, Ser: 2.17 mg/dL — ABNORMAL HIGH (ref 0.44–1.00)
GFR calc Af Amer: 34 mL/min — ABNORMAL LOW (ref >60)
GFR calc Af Amer: 29 mL/min — ABNORMAL LOW (ref >60)
GFR calc non Af Amer: 29 mL/min — ABNORMAL LOW (ref >60)
GFR calc non Af Amer: 25 mL/min — ABNORMAL LOW (ref >60)
Glucose, Bld: 131 mg/dL — ABNORMAL HIGH (ref 70–99)
Glucose, Bld: 286 mg/dL — ABNORMAL HIGH (ref 70–99)
Potassium: 2.9 mmol/L — ABNORMAL LOW (ref 3.5–5.1)
Potassium: 4 mmol/L (ref 3.5–5.1)
Sodium: 139 mmol/L (ref 135–145)
Sodium: 135 mmol/L (ref 135–145)

## 2019-03-13 LAB — MAGNESIUM: Magnesium: 1.3 mg/dL — ABNORMAL LOW (ref 1.7–2.4)

## 2019-03-13 LAB — GLUCOSE, CAPILLARY
Glucose-Capillary: 135 mg/dL — ABNORMAL HIGH (ref 70–99)
Glucose-Capillary: 124 mg/dL — ABNORMAL HIGH (ref 70–99)
Glucose-Capillary: 122 mg/dL — ABNORMAL HIGH (ref 70–99)
Glucose-Capillary: 250 mg/dL — ABNORMAL HIGH (ref 70–99)
Glucose-Capillary: 230 mg/dL — ABNORMAL HIGH (ref 70–99)

## 2019-03-13 LAB — CBC
HCT: 29.1 % — ABNORMAL LOW (ref 36.0–46.0)
Hemoglobin: 8.9 g/dL — ABNORMAL LOW (ref 12.0–15.0)
MCH: 28.5 pg (ref 26.0–34.0)
MCHC: 30.6 g/dL (ref 30.0–36.0)
MCV: 93.3 fL (ref 80.0–100.0)
Platelets: 322 10*3/uL (ref 150–400)
RBC: 3.12 MIL/uL — ABNORMAL LOW (ref 3.87–5.11)
RDW: 14.1 % (ref 11.5–15.5)
WBC: 11.7 10*3/uL — ABNORMAL HIGH (ref 4.0–10.5)
nRBC: 0 % (ref 0.0–0.2)

## 2019-03-13 LAB — TSH: TSH: 2.085 u[IU]/mL (ref 0.350–4.500)

## 2019-03-13 MED ORDER — ENOXAPARIN SODIUM 150 MG/ML ~~LOC~~ SOLN
150.0000 mg | Freq: Two times a day (BID) | SUBCUTANEOUS | Status: DC
  Administered 2019-03-13: 150 mg via SUBCUTANEOUS
  Filled 2019-03-13 (×2): qty 1

## 2019-03-13 MED ORDER — METOPROLOL TARTRATE 50 MG PO TABS
50.0000 mg | ORAL_TABLET | Freq: Two times a day (BID) | ORAL | Status: DC
  Administered 2019-03-13 – 2019-03-16 (×6): 50 mg via ORAL
  Filled 2019-03-13 (×6): qty 1

## 2019-03-13 MED ORDER — APIXABAN 5 MG PO TABS
5.0000 mg | ORAL_TABLET | Freq: Two times a day (BID) | ORAL | Status: DC
  Administered 2019-03-13 – 2019-03-17 (×8): 5 mg via ORAL
  Filled 2019-03-13 (×8): qty 1

## 2019-03-13 MED ORDER — ISOSORBIDE MONONITRATE ER 30 MG PO TB24
15.0000 mg | ORAL_TABLET | Freq: Every day | ORAL | Status: DC
  Administered 2019-03-13 – 2019-03-17 (×5): 15 mg via ORAL
  Filled 2019-03-13 (×5): qty 1

## 2019-03-13 MED ORDER — HYDRALAZINE HCL 25 MG PO TABS
25.0000 mg | ORAL_TABLET | Freq: Three times a day (TID) | ORAL | Status: DC
  Administered 2019-03-13 – 2019-03-17 (×12): 25 mg via ORAL
  Filled 2019-03-13 (×12): qty 1

## 2019-03-13 MED ORDER — DICYCLOMINE HCL 20 MG PO TABS
20.0000 mg | ORAL_TABLET | Freq: Four times a day (QID) | ORAL | Status: DC | PRN
  Administered 2019-03-13 – 2019-03-17 (×7): 20 mg via ORAL
  Filled 2019-03-13 (×8): qty 1

## 2019-03-13 MED ORDER — POTASSIUM CHLORIDE 10 MEQ/100ML IV SOLN: 10.0000 meq | INTRAVENOUS | Status: DC

## 2019-03-13 MED ORDER — METOPROLOL TARTRATE 25 MG PO TABS
25.0000 mg | ORAL_TABLET | Freq: Two times a day (BID) | ORAL | Status: DC
  Administered 2019-03-13: 25 mg via ORAL
  Filled 2019-03-13: qty 1

## 2019-03-13 MED ORDER — POTASSIUM CHLORIDE 10 MEQ/100ML IV SOLN
10.0000 meq | INTRAVENOUS | Status: AC
  Administered 2019-03-13 (×6): 10 meq via INTRAVENOUS
  Filled 2019-03-13 (×6): qty 100

## 2019-03-13 MED ORDER — POTASSIUM CHLORIDE CRYS ER 20 MEQ PO TBCR
40.0000 meq | EXTENDED_RELEASE_TABLET | Freq: Two times a day (BID) | ORAL | Status: DC
  Filled 2019-03-13: qty 2

## 2019-03-13 MED ORDER — MAGNESIUM SULFATE 2 GM/50ML IV SOLN
2.0000 g | Freq: Once | INTRAVENOUS | Status: AC
  Administered 2019-03-13: 2 g via INTRAVENOUS
  Filled 2019-03-13: qty 50

## 2019-03-13 MED ORDER — INSULIN GLARGINE 100 UNIT/ML ~~LOC~~ SOLN
25.0000 [IU] | Freq: Every day | SUBCUTANEOUS | Status: DC
  Administered 2019-03-13 – 2019-03-14 (×2): 25 [IU] via SUBCUTANEOUS
  Filled 2019-03-13 (×3): qty 0.25

## 2019-03-13 MED ORDER — INSULIN ASPART 100 UNIT/ML ~~LOC~~ SOLN
0.0000 [IU] | SUBCUTANEOUS | Status: DC
  Administered 2019-03-13: 3 [IU] via SUBCUTANEOUS
  Administered 2019-03-13: 1 [IU] via SUBCUTANEOUS
  Administered 2019-03-13: 3 [IU] via SUBCUTANEOUS
  Administered 2019-03-14: 5 [IU] via SUBCUTANEOUS
  Administered 2019-03-14 – 2019-03-15 (×7): 3 [IU] via SUBCUTANEOUS
  Administered 2019-03-15 (×2): 2 [IU] via SUBCUTANEOUS
  Administered 2019-03-15 (×2): 3 [IU] via SUBCUTANEOUS
  Administered 2019-03-16 (×2): 1 [IU] via SUBCUTANEOUS
  Administered 2019-03-16: 3 [IU] via SUBCUTANEOUS
  Administered 2019-03-16: 2 [IU] via SUBCUTANEOUS
  Administered 2019-03-16: 1 [IU] via SUBCUTANEOUS
  Administered 2019-03-16: 5 [IU] via SUBCUTANEOUS
  Administered 2019-03-17: 2 [IU] via SUBCUTANEOUS
  Administered 2019-03-17 (×2): 3 [IU] via SUBCUTANEOUS
  Administered 2019-03-17: 2 [IU] via SUBCUTANEOUS

## 2019-03-13 NOTE — Discharge Summary (Addendum)
[facility] Discharge Summary  Patient name: Deborah [name] Medical record number: [id number] Date of birth: Jan 28, 1966 Age: 54 y.o. Gender: female Date of Admission: 03/11/2019  Date of Discharge: 03/17/2019 Admitting Physician: Lurline Del, DO  Primary Care Provider: [name], DO Consultants: Cardiology, GI  Indication for Hospitalization: Nausea/vomiting likely secondary to gastroparesis  Discharge Diagnoses/Problem List:  Active Problems:   Essential hypertension, benign   Uncontrolled type 2 diabetes mellitus with diabetic neuropathy, with long-term current use of insulin (HCC)   CKD (chronic kidney disease), stage II   Gastroparesis   History of gastric ulcer   Cough   Acute on chronic congestive heart failure ([place])  Disposition: Home  Discharge Condition: Stable  Discharge Exam:  General: Alert and oriented in no apparent distress Heart: Regular rate and rhythm with no murmurs appreciated Lungs: CTA bilaterally, no wheezing, breathing well on room air Abdomen: Bowel sounds present, mild abdominal discomfort to palpation, no peritoneal signs. Skin: Warm and dry Extremities: Trace lower limb edema, improved from yesterday.  Brief Hospital Course:  Patient was admitted to the emergency department after having nausea, vomiting, abdominal pain, and some reproducible chest pain similar to how she felt during prior admissions for gastroparesis.  In the emergency department patient was started on Reglan, was given pain control, and fluid resuscitation.  On day 2 of her hospitalization her symptoms had somewhat improved with her only vomiting about 3 times over the night prior.  Her abdominal pain was still present but had improved and she was able to advance her diet.  Gastroenterology was consulted as the patient has had numerous admissions for diabetic gastroparesis over the past few months.  Their recommendations were to continue to treat  her diabetes, PPI BID and consider scheduled antiemetics during hospitalization.  A. fib with RVR On day 2 of her hospitalization patient had a period of tachycardia prompting an EKG which showed A. fib with RVR.  Cardiology was consulted.  They recommended transition to Toprol time of discharge and at that time begin Eliquis 5 mg twice daily long-term.  As the patient was asymptomatic they recommended her to need rate control and anticoagulation long-term.  They also recommended initiating deal as the patient is not a candidate for ACE due to history of facial swelling.  Stated patient can add back amlodipine in the future if blood pressure control is not met with the above regimen.  Fluid overload: On 1/7 patient endorsed new cough and some shortness of breath with a desat and was placed on 2L O2 via nasal cannula. [id number] and flu were rechecked and were negative. A CXR showed signs of fluid overload. Patient also had 1+ pitting edema. Patient's lasix was switched to IV at twice her home dose and her breathing improved.  Prior to discharge patient had an ambulatory pulse ox which was measured with her in a wheelchair she states she normally moves around her home in a wheelchair and she did not require oxygen at this time.  Patient was discharged on 53m PO lasix.  Hypokalemia After the increase in the patient's Lasix the patient did receive some K-Dur due to some lower potassium levels. Last Serum potassium was 3.0 on 1/8. Patient was repleated with oral Kdur. Repeat labs were ordered for 1/9, but patient declined labs. Patient discharged on 20 of K-Dur with close follow-up with PCP schedule.  Type 2 diabetes, hyperglycemia Admission patient had elevated glucose in the 400s.  Patient was started on 2/3 dose (  35 units) of her outpatient 50 unit/day at Lantus.  She was also started on the resistant sliding scale.  Patient's Lantus was adjusted throughout her hospitalization and settled on 30 units with  sliding scale every 4 hours.  Upon discharge it was noted that the patient was not using much insulin around her meals compared to the much larger amount of insulin she had been using prior to admission.  Because of this patient was discharged with her mealtime insulin held and on 30 units of Lantus rather than her normal home dose of 50 units of Lantus with close PCP follow-up on Monday to determine the dosing that she would need at this point.  UTI Early in the hospitalization patient endorsed some suprapubic tenderness and urine culture grew greater than 100,000 colonies of E. coli that were pansensitive.  Patient was started on Keflex.  This was initially renally dosed at 250 twice daily and increased to 500 every 8 hours after the patient's creatinine improved.  Patient was discharged with a total of 7 days antibiotics for her UTI.  Issues for Follow Up:  1. Consider SGLT2 inhibitor as outpatient if patient is candidate 2. Ensure patient continues on anticoagulation as recommended by cardiology 3. Allopurinol reduced during hospitalization, PCP consider restarting previous dose 4. Patient discharged on 40 mg p.o. Lasix compared to her normal home dose of 20 mg p.o. Lasix.  Recommend PCP follow-up on fluid status and recheck a BMP. 5. Follow up with GI outpatient. 6. Per cardiology recommendations we stopped amlodipine while initiating Imdur, can readd amlodipine if needed for additional blood pressure control in the future. 7. PCP-patient discharged on 7 total days of Keflex for pansensitive E. coli UTI.  Significant Procedures: None  Significant Labs and Imaging:  Recent Labs  Lab 03/15/19 0439 03/16/19 1215 03/17/19 1220  WBC 13.7* 10.8* 8.8  HGB 8.7* 8.7* 8.2*  HCT 28.5* 28.2* 26.7*  PLT 328 322 348   Recent Labs  Lab 03/11/19 1436 03/12/19 0756 03/13/19 0430 03/13/19 0854 03/13/19 1612 03/14/19 0450 03/14/19 1442 03/15/19 0439 03/16/19 1215  NA 135 132* 139  --  135  --   135 137 136  K 3.5 3.7 2.9*  --  4.0  --  3.6 3.7 3.0*  CL 99 95* 105  --  103  --  108 108 106  CO2 21* 21* 24  --  21*  --  18* 22 22  GLUCOSE 451* 430* 131*  --  286*  --  258* 209* 179*  BUN 18 24* 24*  --  25*  --  25* 21* 15  CREATININE 1.34* 1.82* 1.92*  --  2.17*  --  1.70* 1.48* 1.43*  CALCIUM 8.5* 8.6* 7.8*  --  7.4*  --  7.6* 8.0* 8.5*  MG  --   --   --  1.3*  --  1.7  --  1.9  --   ALKPHOS 89 94  --   --   --   --   --   --   --   AST 16 13*  --   --   --   --   --   --   --   ALT 16 16  --   --   --   --   --   --   --   ALBUMIN 3.1* 3.3*  --   --   --   --   --   --   --  Results/Tests Pending at Time of Discharge: None  Discharge Medications:  Allergies as of 03/17/2019       Reactions   Contrast Media [iodinated Diagnostic Agents] Anaphylaxis   Cardiac arrest   Diazepam [name]   Isovue [iopamidol] Anaphylaxis   Patient had seizure like activity and then code post 100 cc of isovue 300   Lisinopril Anaphylaxis   Tongue and mouth swelling   Penicillins Palpitations   Has patient had a PCN reaction causing immediate rash, facial/tongue/throat swelling, SOB or lightheadedness with hypotension: Yes, heart races Has patient had a PCN reaction causing severe rash involving mucus membranes or skin necrosis: No Has patient had a PCN reaction that required hospitalization: Yes  Has patient had a PCN reaction occurring within the last 10 years: No   Acetaminophen Nausea Only, Other (See Comments)   Irritates stomach ulcer  Abdominal pain   Tolmetin Nausea Only   Other reaction(s): Other (See Comments) ULCER   Aspirin Other (See Comments)   Irritates stomach ulcer    Food Hives, Swelling   Carrots, ketchup    Nsaids Other (See Comments)   ULCER   Tramadol Nausea And Vomiting        Medication List     STOP taking these medications    amLODipine 10 MG tablet Commonly known as: NORVASC   insulin aspart 100 UNIT/ML injection Commonly known  as: NovoLOG   loperamide 2 MG capsule Commonly known as: IMODIUM       TAKE these medications    allopurinol 100 MG tablet Commonly known as: ZYLOPRIM Take 0.5 tablets (50 mg total) by mouth daily. Start taking on: March 18, 2019 What changed: how much to take   apixaban 5 MG Tabs tablet Commonly known as: ELIQUIS Take 1 tablet (5 mg total) by mouth 2 (two) times daily.   atorvastatin 10 MG tablet Commonly known as: LIPITOR Take 1 tablet (10 mg total) by mouth daily.   blood glucose meter kit and supplies Kit Dispense based on patient and insurance preference. Use up to four times daily as directed. (FOR ICD-9 250.00, 250.01).   cephALEXin 500 MG capsule Commonly known as: KEFLEX Take 1 capsule (500 mg total) by mouth 3 (three) times daily for 6 days.   cetirizine 10 MG tablet Commonly known as: ZYRTEC Take 1 tablet (10 mg total) by mouth daily.   dicyclomine 20 MG tablet Commonly known as: BENTYL Take 1 tablet (20 mg total) by mouth 3 (three) times daily as needed for spasms (abdominal cramping).   DULoxetine HCl 40 MG Cpep Take 40 mg by mouth 2 (two) times daily.   fluticasone 50 MCG/ACT nasal spray Commonly known as: FLONASE Place 2 sprays into both nostrils daily as needed for allergies or rhinitis.   furosemide 40 MG tablet Commonly known as: LASIX Take 1 tablet (40 mg total) by mouth daily. Start taking on: March 18, 2019 What changed:   medication strength  how much to take  additional instructions   hydrALAZINE 25 MG tablet Commonly known as: APRESOLINE TAKE 1 TABLET (25 MG TOTAL) BY MOUTH 3 (THREE) TIMES DAILY. FOR BLOOD PRESSURE   isosorbide mononitrate 30 MG 24 hr tablet Commonly known as: IMDUR Take 0.5 tablets (15 mg total) by mouth daily. Start taking on: March 18, 2019   Lantus SoloStar 100 UNIT/ML Solostar Pen Generic drug: Insulin Glargine Inject 30 Units into the skin at bedtime. What changed: how much to take   Melatonin  3 MG Tabs  Take 2 tablets (6 mg total) by mouth at bedtime.   metoCLOPramide 10 MG tablet Commonly known as: REGLAN Take 1 tablet (10 mg total) by mouth 3 (three) times daily before meals.   metoprolol succinate 50 MG 24 hr tablet Commonly known as: TOPROL-XL Take 1 tablet (50 mg total) by mouth daily. Take with or immediately following a meal. What changed:   medication strength  how much to take  additional instructions   nitroGLYCERIN 0.4 MG SL tablet Commonly known as: NITROSTAT Place 1 tablet (0.4 mg total) under the tongue every 5 (five) minutes as needed for chest pain.   ondansetron 4 MG disintegrating tablet Commonly known as: Zofran ODT Take 1 tablet (4 mg total) by mouth every 8 (eight) hours as needed for nausea or vomiting.   pantoprazole 40 MG tablet Commonly known as: PROTONIX Take 1 tablet (40 mg total) by mouth 2 (two) times daily. What changed: when to take this   Potassium Chloride ER 20 MEQ Tbcr Take 20 mEq by mouth daily.        Discharge Instructions: Please refer to Patient Instructions section of EMR for full details.  Patient was counseled important signs and symptoms that should prompt return to medical care, changes in medications, dietary instructions, activity restrictions, and follow up appointments.   Follow-Up Appointments: Follow-up Information     Charlie Pitter, PA-C Follow up.   Specialties: Cardiology, Radiology Why: [place] - as below, we have scheduled you for an appointment Thursday Mar 29, 2019 at 11:30 AM. Please arrive 15 minutes prior to appointment to check in. Lisbeth Renshaw is one of the PAs that works with Dr. Radford Pax and the cardiology team. Contact information: [address] [place] [address] Remy, [place], DO. Go on 03/19/2019.   Specialty: Family Medicine Why: 1/11 at 1150 with Dr. Garlan Fillers. Contact information: [address] [place]  [address] [phone]         Sueanne Margarita, MD .   Specialty: Cardiology Contact information: [phone] [address] [place] [address] [phone]            Lurline Del, DO 03/17/2019, 1:12 PM PGY-1, [place], MD, [place] - PGY3 03/17/2019 4:36 PM

## 2019-03-13 NOTE — Progress Notes (Addendum)
ANTICOAGULATION CONSULT NOTE - Initial Consult  Pharmacy Consult for Apixaban Indication: atrial fibrillation  Allergies  Allergen Reactions  . Contrast Media [Iodinated Diagnostic Agents] Anaphylaxis    Cardiac arrest  . Diazepam [name]  . Isovue [Iopamidol] Anaphylaxis    Patient had seizure like activity and then code post 100 cc of isovue 300  . Lisinopril Anaphylaxis    Tongue and mouth swelling  . Penicillins Palpitations    Has patient had a PCN reaction causing immediate rash, facial/tongue/throat swelling, SOB or lightheadedness with hypotension: Yes, heart races Has patient had a PCN reaction causing severe rash involving mucus membranes or skin necrosis: No Has patient had a PCN reaction that required hospitalization: Yes  Has patient had a PCN reaction occurring within the last 10 years: No   . Acetaminophen Nausea Only and Other (See Comments)    Irritates stomach ulcer  Abdominal pain  . Tolmetin Nausea Only    Other reaction(s): Other (See Comments) ULCER  . Aspirin Other (See Comments)    Irritates stomach ulcer   . Food Hives and Swelling    Carrots, ketchup   . Nsaids Other (See Comments)    ULCER  . Tramadol Nausea And Vomiting    Patient Measurements: Weight: (!) 337 lb 4.9 oz (153 kg)  Vital Signs: Temp: 97.4 F (36.3 C) (01/05 0916) Temp Source: Oral (01/05 0916) BP: 122/67 (01/05 0916) Pulse Rate: 80 (01/05 0916)  Labs: Recent Labs    03/11/19 1436 03/12/19 0405 03/12/19 0756 03/13/19 0430  HGB 10.2*  --  10.7* 8.9*  HCT 33.2*  --  34.3* 29.1*  PLT 390  --  374 322  CREATININE 1.34*  --  1.82* 1.92*  TROPONINIHS 50* 56* 52*  --     Estimated Creatinine Clearance: 51.2 mL/min (A) (by C-G formula based on SCr of 1.92 mg/dL (H)).   Medical History: Past Medical History:  Diagnosis Date  . Acute back pain with sciatica, left   . Acute back pain with sciatica, right   . Anemia, unspecified   . Chest pain 12/2015  .  Chronic pain   . Diabetes mellitus   . Esophageal reflux   . Fibromyalgia   . Gastric ulcer   . Gastroparesis   . Gout   . Hyperlipidemia   . Hypertension   . Lumbosacral stenosis   . Obesity   . Pneumonia   . [name]   . Stroke ([place]) 02/2011  . Vitamin B12 deficiency anemia     Medications:  Scheduled:  . allopurinol  50 mg Oral Daily  . apixaban  5 mg Oral BID  . atorvastatin  10 mg Oral Daily  . DULoxetine  40 mg Oral BID  . hydrALAZINE  25 mg Oral Q8H  . insulin aspart  0-9 Units Subcutaneous Q4H  . insulin glargine  25 Units Subcutaneous QHS  . isosorbide mononitrate  15 mg Oral Daily  . loratadine  10 mg Oral Daily  . metoprolol tartrate  50 mg Oral BID  . pantoprazole  40 mg Oral Daily  . sodium chloride flush  10-40 mL Intracatheter Q12H  . sodium chloride flush  3 mL Intravenous Once    Assessment: CJ is a 54 y.o. female presenting with N/V/D secondary to diabetic gastroparesis and newly diagnosed with Afib w/RVR. PMH includes HTN, gastroparesis, CHF, DM, obesity, HLD, hx of stroke in 2012, CKD stage II, fibromyalgia, chronic pain, and anxiety. No AC PTA. CHA2DS2VASC: 6. Last  dose of enoxaparin 150 mg at 0854 this morning. Scr 1.92, wt > 60 kg. Plts wnl, Hgb 10.7>8.9 - pt reports intractable nausea, vomiting and dehydration. Pharmacy consulted for apixaban dosing.  Goal of Therapy:  Monitor platelets by anticoagulation protocol: Yes   Plan:  Start Apixaban 5 mg BID @ 2200 tonight Check CBC in AM Education and AVS required Monitor s/sx of bleeding   Lorel Monaco, PharmD PGY1 Ambulatory Care Resident [employer] # [phone]

## 2019-03-13 NOTE — Consult Note (Addendum)
[facility] Consult: 1:55 PM 03/13/2019  LOS: 0 days    Referring Provider: Dr Vanessa Fillmore of  [facility]  Primary Care Physician:  [name], DO Primary Gastroenterologist:  Althia Forts.  Seen GI in [place], [place], Dr. Teena Irani as inpatient 04/2016   Reason for Consultation: Diabetic gastroparesis.   HPI: Deborah [name] is a 54 y.o. female.  Type 2, IDDM.  Diabetic gastroparesis.  Fibromyalgia.  Chronic pain.  S/p cholecystectomy.  Fatty liver.  Morbid obesity.  CVA.  Panic disorder.  Left heart catheterization 09/2017 negative.  ND left lower extremity abscess 06/2016.  CHF.  Latest echo 01/26/2019 with LVEF 40 to 45%.  Left ventricular global hypokinesis.  Uterine fibroids.  Anemia.    03/2015 EGD.   Rita Ohara MD ([facility]).  Pyloric channel ulcer.  Twice daily PPI added. 07/2015 EGD, unremarkable.  MD recommended outpatient surgical follow-up for cholecystectomy. 08/2015 laparoscopic cholecystectomy at [facility] in [place].  Preop HIDA scan showed gallbladder EF 4%, possible biliary dyskinesia. 04/2016 gastric emptying study normal. 53% emptied at 1 hr ( normal >= 10%).  85% emptied at 2 hr ( normal >= 40%).  95% emptied at 3 hr ( normal >= 70%)  Several years of recurrent nausea and vomiting requiring hospitalizations, 5 times this year as well as in past years.   Never followed up with her previous GI doctors. C. difficile negative on 01/27/2019 At discharge from 02/21/2019 admission she was treated with Zofran, Reglan, Bentyl, Protonix.  LFTs then as well as during previous admissions unremarkable.  Hgb A!c 9.6.   11/2017 MR pelvis: Exophytic uterine fibroid and additional smaller intramural uterine fibroids. 07/2018, 12/17/2018 CTAP without contrast: Hepatic steatosis.  5.1 x 4.5 cm  left pelvic mass corresponding with exophytic fibroid described in MRI of 11/2017.  Once again readmitted w nausea, vomiting, abdominal pain.  The pain is located in her lower abdomen.  It does not seem to be relieved by the nausea or vomiting.  Not relieved by having a bowel movement.  The emesis is clear, brown at times.  No blood or coffee-ground material.  Compliant with all of her outpatient medications.  This includes PPI twice daily, Reglan AC.  For the most part oral antiemetics are ineffective and she ends up coming to the hospital in order to receive IV antiemetics.  Has not had any bowel movements for a few days because of the nausea and vomiting.  Does not use NSAIDs or Tylenol.  Cymbalta is her pain management medication. Currently has hypokalemia at 2.9.  AKI at 24/1.9. Anemic at 8.9, range within the last 4 weeks 8.6-10.7  Medical teaching service is wondering if there are any other strategies for management of diabetic gastroparesis.  Past Medical History:  Diagnosis Date  . Acute back pain with sciatica, left   . Acute back pain with sciatica, right   . Anemia, unspecified   . Chest pain 12/2015  . Chronic pain   . Diabetes mellitus   . Esophageal reflux   . Fibromyalgia   . Gastric ulcer   .  Gastroparesis   . Gout   . Hyperlipidemia   . Hypertension   . Lumbosacral stenosis   . Obesity   . Pneumonia   . [name]   . Stroke ([place]) 02/2011  . Vitamin B12 deficiency anemia     Past Surgical History:  Procedure Laterality Date  . CATARACT EXTRACTION  01/2014  . CHOLECYSTECTOMY      Prior to Admission medications   Medication Sig Start Date End Date Taking? Authorizing Provider  allopurinol (ZYLOPRIM) 100 MG tablet Take 1 tablet (100 mg total) by mouth daily. 09/23/15  Yes Rizwan, Eunice Blase, MD  amLODipine (NORVASC) 10 MG tablet TAKE ONE TABLET BY MOUTH ONCE DAILY (AM) Patient taking differently: Take 10 mg by mouth daily.  03/05/19  Yes Lockamy, Timothy, DO    atorvastatin (LIPITOR) 10 MG tablet Take 1 tablet (10 mg total) by mouth daily. 12/13/18  Yes Lockamy, Christia Reading, DO  cetirizine (ZYRTEC) 10 MG tablet Take 1 tablet (10 mg total) by mouth daily. 12/13/18  Yes Lockamy, Timothy, DO  DULoxetine HCl 40 MG CPEP Take 40 mg by mouth 2 (two) times daily. 02/16/19 03/18/19 Yes Lockamy, Timothy, DO  fluticasone (FLONASE) 50 MCG/ACT nasal spray Place 2 sprays into both nostrils daily as needed for allergies or rhinitis. 12/19/18  Yes Rai, Ripudeep K, MD  furosemide (LASIX) 20 MG tablet Take 1 tablet (20 mg total) by mouth daily. Hold for 2 days 01/29/19  Yes Milus Banister C, DO  hydrALAZINE (APRESOLINE) 25 MG tablet TAKE 1 TABLET (25 MG TOTAL) BY MOUTH 3 (THREE) TIMES DAILY. FOR BLOOD PRESSURE 03/05/19  Yes Lockamy, Timothy, DO  insulin aspart (NOVOLOG) 100 UNIT/ML injection Inject 30 Units into the skin 4 (four) times daily -  with meals and at bedtime. 01/23/19  Yes Lockamy, Timothy, DO  LANTUS SOLOSTAR 100 UNIT/ML Solostar Pen Inject 50 Units into the skin at bedtime. 01/18/19  Yes [provider]  loperamide (IMODIUM) 2 MG capsule Take 2 capsules (4 mg total) by mouth as needed for diarrhea or loose stools. 01/29/19  Yes Daisy Floro, DO  Melatonin 3 MG TABS Take 2 tablets (6 mg total) by mouth at bedtime. 12/13/18  Yes Lockamy, Timothy, DO  metoCLOPramide (REGLAN) 10 MG tablet Take 1 tablet (10 mg total) by mouth 3 (three) times daily before meals. 12/19/18  Yes Rai, Ripudeep K, MD  metoprolol succinate (TOPROL-XL) 25 MG 24 hr tablet TAKE ONE TABLET BY MOUTH ONCE DAILY Patient taking differently: Take 25 mg by mouth daily.  03/05/19  Yes Lockamy, Timothy, DO  nitroGLYCERIN (NITROSTAT) 0.4 MG SL tablet Place 1 tablet (0.4 mg total) under the tongue every 5 (five) minutes as needed for chest pain. 12/13/18  Yes Lockamy, Timothy, DO  pantoprazole (PROTONIX) 40 MG tablet Take 1 tablet (40 mg total) by mouth daily. 03/10/19 04/09/19 Yes Long, Wonda Olds,  MD  azelastine (ASTELIN) 0.1 % nasal spray Place 2 sprays into both nostrils 2 (two) times daily. Use in each nostril as directed Patient not taking: Reported on 03/12/2019 01/23/19   [name], DO  blood glucose meter kit and supplies KIT Dispense based on patient and insurance preference. Use up to four times daily as directed. (FOR ICD-9 250.00, 250.01). 12/13/18   [name], DO  dicyclomine (BENTYL) 20 MG tablet Take 1 tablet (20 mg total) by mouth 3 (three) times daily as needed for spasms (abdominal cramping). Patient not taking: Reported on 03/12/2019 03/10/19   Long, Wonda Olds, MD  ondansetron (  ZOFRAN ODT) 4 MG disintegrating tablet Take 1 tablet (4 mg total) by mouth every 8 (eight) hours as needed for nausea or vomiting. Patient not taking: Reported on 03/12/2019 03/10/19   Margette Fast, MD    Scheduled Meds: . allopurinol  50 mg Oral Daily  . apixaban  5 mg Oral BID  . atorvastatin  10 mg Oral Daily  . DULoxetine  40 mg Oral BID  . hydrALAZINE  25 mg Oral Q8H  . insulin aspart  0-9 Units Subcutaneous Q4H  . insulin glargine  25 Units Subcutaneous QHS  . isosorbide mononitrate  15 mg Oral Daily  . loratadine  10 mg Oral Daily  . metoprolol tartrate  50 mg Oral BID  . pantoprazole  40 mg Oral Daily  . sodium chloride flush  10-40 mL Intracatheter Q12H  . sodium chloride flush  3 mL Intravenous Once   Infusions: . sodium chloride 125 mL/hr at 03/12/19 2138  . potassium chloride 10 mEq (03/13/19 1246)   PRN Meds: fluticasone, metoCLOPramide (REGLAN) injection, nitroGLYCERIN, sodium chloride flush   Allergies as of 03/11/2019 - Review Complete 03/10/2019  Allergen Reaction Noted  . Contrast media [iodinated diagnostic agents] Anaphylaxis 11/28/2017  . Diazepam [name] 09/21/2011  . Isovue [iopamidol] Anaphylaxis 11/28/2017  . Lisinopril Anaphylaxis 09/21/2011  . Penicillins Palpitations 02/04/2011  . Acetaminophen Nausea Only and Other (See Comments)  08/28/2015  . Tolmetin Nausea Only 09/21/2015  . Aspirin Other (See Comments) 09/26/2015  . Food Hives and Swelling 12/12/2013  . Nsaids Other (See Comments) 09/21/2015  . Tramadol Nausea And Vomiting 12/22/2015    Family History  Problem Relation Age of Onset  . Diabetes Mother   . Diabetes Father   . Heart disease Father   . Diabetes Sister   . Congestive Heart Failure Sister 44  . Diabetes Brother     Social History   Socioeconomic History  . Marital status: Single    Spouse name: Not on file  . Number of children: Not on file  . Years of education: Not on file  . Highest education level: Not on file  Occupational History  . Not on file  Tobacco Use  . Smoking status: Never Smoker  . Smokeless tobacco: Never Used  Substance and Sexual Activity  . Alcohol use: No  . Drug use: No  . Sexual activity: Not Currently    Birth control/protection: None  Other Topics Concern  . Not on file  Social History Narrative   ** Merged History Encounter **       Social Determinants of Health   Financial Resource Strain:   . Difficulty of Paying Living Expenses: Not on file  Food Insecurity:   . Worried About Charity fundraiser in the Last Year: Not on file  . Ran Out of Food in the Last Year: Not on file  Transportation Needs:   . Lack of Transportation (Medical): Not on file  . Lack of Transportation (Non-Medical): Not on file  Physical Activity:   . Days of Exercise per Week: Not on file  . Minutes of Exercise per Session: Not on file  Stress:   . Feeling of Stress : Not on file  Social Connections:   . Frequency of Communication with Friends and Family: Not on file  . Frequency of Social Gatherings with Friends and Family: Not on file  . Attends Religious Services: Not on file  . Active Member of Clubs or Organizations: Not on file  .  Attends Archivist Meetings: Not on file  . Marital Status: Not on file  Intimate Partner Violence:   . Fear of Current  or Ex-Partner: Not on file  . Emotionally Abused: Not on file  . Physically Abused: Not on file  . Sexually Abused: Not on file    REVIEW OF SYSTEMS: Constitutional: Some fatigue, some weakness, nothing profound. ENT:  No nose bleeds Pulm: Some shortness of breath, nothing profound. CV:  No palpitations, no LE edema.  No angina GU:  No hematuria, no frequency GI: See HPI.  No weight loss. Heme: Denies unusual bleeding or bruising. Transfusions: [place] previous transfusions with blood products. Neuro:  No headaches, no peripheral tingling or numbness.  Seizures.  No syncope. Derm:  No itching, no rash or sores.  Endocrine: At home blood sugars run in the 200s.  She is waiting to be established with a endocrinologist. Immunization: Reviewed Travel:  None beyond local counties in last few months.    PHYSICAL EXAM: Vital signs in last 24 hours: Vitals:   03/13/19 0504 03/13/19 0916  BP: (!) 109/54 122/67  Pulse: (!) 113 80  Resp: 16 18  Temp: 98.6 F (37 C) (!) 97.4 F (36.3 C)  SpO2: 91% 93%   Wt Readings from Last 3 Encounters:  03/12/19 (!) 153 kg  03/10/19 (!) 151.5 kg  02/23/19 (!) 151 kg    General: Obese, nonacutely ill-appearing Head: No facial asymmetry or swelling.  No signs of head trauma. Eyes: Scleral icterus.  No conjunctival pallor.  EOMI Ears: Not hard of hearing Nose: No congestion, no discharge Mouth: Moist, pink, clear mucosa.  Tongue midline.  Good dentition. Neck: No JVD, no masses, no thyromegaly. Lungs: Clear bilaterally.  No labored breathing, no cough. Heart: RRR.  No MRG.  S1, S2 present. Abdomen: Obese.  Soft without masses.  Active bowel sounds.  No hernias.  Minor tenderness in the lower abdomen.  No guarding or rebound..   Rectal: Deferred Musc/Skeltl: No gross joint swelling or deformities. Extremities: Some nonpitting swelling in the feet, question is this swelling versus adipose tissue. Neurologic: Oriented x3.  Moves all 4 limbs,  strength not tested.  No involuntary movements or tremors. Skin: No rash, no sores Nodes: No cervical adenopathy Psych: Flat affect.  Seems worn out.  Somewhat disengaged.  Fluid speech.  Intake/Output from previous day: 01/04 0701 - 01/05 0700 In: 1989.6 [I.V.:989.6; IV Piggyback:1000] Out: 0  Intake/Output this shift: Total I/O In: 120 [P.O.:120] Out: 0   LAB RESULTS: Recent Labs    03/11/19 1436 03/12/19 0756 03/13/19 0430  WBC 13.2* 14.7* 11.7*  HGB 10.2* 10.7* 8.9*  HCT 33.2* 34.3* 29.1*  PLT 390 374 322   BMET Lab Results  Component Value Date   NA 139 03/13/2019   NA 132 (L) 03/12/2019   NA 135 03/11/2019   K 2.9 (L) 03/13/2019   K 3.7 03/12/2019   K 3.5 03/11/2019   CL 105 03/13/2019   CL 95 (L) 03/12/2019   CL 99 03/11/2019   CO2 24 03/13/2019   CO2 21 (L) 03/12/2019   CO2 21 (L) 03/11/2019   GLUCOSE 131 (H) 03/13/2019   GLUCOSE 430 (H) 03/12/2019   GLUCOSE 451 (H) 03/11/2019   BUN 24 (H) 03/13/2019   BUN 24 (H) 03/12/2019   BUN 18 03/11/2019   CREATININE 1.92 (H) 03/13/2019   CREATININE 1.82 (H) 03/12/2019   CREATININE 1.34 (H) 03/11/2019   CALCIUM 7.8 (L) 03/13/2019   CALCIUM  8.6 (L) 03/12/2019   CALCIUM 8.5 (L) 03/11/2019   LFT Recent Labs    03/11/19 1436 03/12/19 0756  PROT 7.0 7.3  ALBUMIN 3.1* 3.3*  AST 16 13*  ALT 16 16  ALKPHOS 89 94  BILITOT 0.6 0.8   PT/INR Lab Results  Component Value Date   INR 1.2 12/15/2018   Hepatitis Panel No results for input(s): HEPBSAG, HCVAB, HEPAIGM, HEPBIGM in the last 72 hours. C-Diff No components found for: CDIFF Lipase     Component Value Date/Time   LIPASE 23 03/11/2019 1436    Drugs of Abuse     Component Value Date/Time   LABOPIA NONE DETECTED 07/06/2016 0746   COCAINSCRNUR NONE DETECTED 07/06/2016 0746   LABBENZ NONE DETECTED 07/06/2016 0746   AMPHETMU NONE DETECTED 07/06/2016 0746   THCU NONE DETECTED 07/06/2016 0746   LABBARB NONE DETECTED 07/06/2016 0746     RADIOLOGY  STUDIES: DG Chest 2 View  Result Date: 03/11/2019 CLINICAL DATA:  Chest pain.  [name].  Nausea. EXAM: CHEST - 2 VIEW COMPARISON:  03/10/2019 FINDINGS: Both views are degraded by patient body habitus. Midline trachea. Borderline cardiomegaly. Mediastinal contours otherwise within normal limits. No pleural effusion or pneumothorax. Clear lungs. No congestive failure. Low lung volumes. IMPRESSION: Mild cardiomegaly and low lung volumes, without acute disease. Electronically Signed   By: Abigail Miyamoto M.D.   On: 03/11/2019 15:02      IMPRESSION:   *    Episodic nausea, vomiting, abdominal pain.   Carries diagnosis of diabetic gastroparesis though gastric emptying study of 2018 was normal. Pyloric channel ulcer 2017, this had healed by the time of follow-up 4 months later.  *   Type 2 diabetes.  Insulin requiring.  Not well controlled.  *    Normocytic anemia.  *     Fatty liver per imaging.  LFTs, lipase normal.   PLAN:     *   Per Dr Henrene Pastor.  Not clear repeat EGD will add anything to care of pt.        Azucena Freed  03/13/2019, 1:55 PM Phone [phone]  GI ATTENDING  History, laboratories, x-rays, prior endoscopy reports reviewed.  Agree with comprehensive consultation note as outlined above.  Patient with poorly controlled diabetes has recurrent problems with nausea and vomiting.  The principal concern would be some degree of gastric dysfunction in the face of poorly controlled diabetes.  Last gastric emptying scan was within normal limits, though this was greater than 2 years ago.  As well, I see no recent hemoglobin A1c.  In early 2018 her value was 8.9.  This should be repeated.  Also, confirm no regular cannabis use.  Last drug screen 2-1/2 years ago was negative.  She did have prior documented pyloric channel ulcer.  No bleeding.  She is status post cholecystectomy.  At this point I recommend twice daily PPI to address any acid peptic disorders.  I recommend running  antiemetics on a regular basis such as Zofran every 4-6 hours to address nausea.  I recommend aggressive management of her diabetes as this ultimately will enhance gastric performance.  Finally, start with clear liquids and advance diet as tolerated.  It appears that she was dehydrated on admission.  Continue adequate IV fluid replacement.  No plans for endoscopy.  Please contact us for any questions you might have.  We are available as needed.  Docia Chuck. Geri Seminole., M.D. [facility] Division of Gastroenterology

## 2019-03-13 NOTE — Plan of Care (Signed)
  Problem: Education: Goal: Knowledge of General Education information will improve Description: Including pain rating scale, medication(s)/side effects and non-pharmacologic comfort measures Outcome: Progressing   Problem: Pain Managment: Goal: General experience of comfort will improve Outcome: Progressing   

## 2019-03-13 NOTE — Progress Notes (Signed)
Inpatient Diabetes Program Recommendations  AACE/ADA: New Consensus Statement on Inpatient Glycemic Control (2015)  Target Ranges:  Prepandial:   less than 140 mg/dL      Peak postprandial:   less than 180 mg/dL (1-2 hours)      Critically ill patients:  140 - 180 mg/dL   Lab Results  Component Value Date   GLUCAP 124 (H) 03/13/2019   HGBA1C 9.6 (H) 02/20/2019    Review of Glycemic Control  Diabetes history: DM 2 Outpatient Diabetes medications: Novolog 30 units 4x day, Lantus 50 units qhs Current orders for Inpatient glycemic control:  Lantus 25 units  Novolog 0-9 units Q4 hours  A1c 9.6% on 12/15  Spoke with pt regarding glucose control at home. Pt goes to Dr. Garlan Fillers for DM management and sees him Q3 months. Pt last saw him in December and her A1c was 9.6%. Pt reports since then her glucose trends are still usually in the 200 range.   Pt reports when she got sick she did not take her insulin Saturday or Sunday. Discussed sick day guidelines and encouraged her to for a sick day plan with her PCP. Also discussed to call her PCP for glucose trends > 200 for advisement in insulin titration.   Discussed glucose and A1c goals. Pt has plenty of DM supplies and insulin at home. Pt to see her PCP in 2 months.   Thanks,  Tama Headings RN, MSN, BC-ADM Inpatient Diabetes Coordinator Team Pager [phone] (8a-5p)

## 2019-03-13 NOTE — Consult Note (Addendum)
Cardiology Consultation:   Patient ID: [name] MRN: [id number]; DOB: 08-Mar-1966  Admit date: 03/11/2019 Date of Consult: 03/13/2019  Primary Care Provider: [name], DO Primary Cardiologist: Fransico Him, MD (2017) Primary Electrophysiologist:  None    Patient Profile:   Deborah [name] is a 54 y.o. female with a hx of HTN, gastroparesis, DM, obesity, hyperlipidemia, hx of stroke in 2012, CKD stage II, fibromyalgia, chronic pain, and anxiety who is being seen today for the evaluation of new onset atrial fibrillation with RVR at the request of Dr. Andria Frames.  History of Present Illness:   Ms. Siravo was seen in 2017 by our service during a hospitalization for atypical chest pain. Lexiscan myoview showed small questionable complete infarct/scar vs artifact in the anterior lateral wall, no reversible ischemia. It was thought her CP was due to gastroparesis and anxiety. She was not seen by cardiology in the interim. Echo 2018 with normal EF. She was seen at [facility] 09/2017 and underwent cardiac catheterization on 09/14/17 which revealed normal coronaries. Echo at that time revealed EF of 45-50%. She has a history of lower extremity weakness for which she has been extensively evaluated by neurology and felt to not be GBS. In 2019, plavix for hx of stroke with residual right sided weakness was D/C'ed (reason unclear, notes mention continue plavix). Chronic lower extremity swelling due to venous insufficiency, although she had a low-normal EF.   She has had recent ER visits for chest pain and hypertension, generally in the setting of N/V/gastroparesis. [name] She was seen on 01/26/19 for chest pain. Lexiscan myoview on 01/27/19 showed medium defect of moderate severity in the anterolateral and apical anterior location felt to be consistent with breast attenuation vs chest wall artifact. Stress test read as intermediate risk due to EF of 36%. Follow up echocardiogram  revealed EF of 40-45% with global hypokinesis without focal coronary distribution. Normal left and right atrial size. HS troponin at that time was 55-47. She was seen in the ER on 02/20/19 with chest pain and intractable vomiting with abdominal pain. She had epigastric tenderness and tenderness to palpation of her chest. She was treated for hyperglycemia and gastroparesis. Given her atypical pain reproducible with palpation and mildly elevated but flat troponin, no further cardiac workup was completed at that time.   She reported back to the ER on 03/10/19 with intractable nausea, vomiting, dehydration, abdominal pain, and chest pain. Symptoms were treated and she was discharged from the ER.  Unfortunately, she reported back to the ER on 03/12/19 and waiting 17.5 hr in the waiting room to be seen. She reported near absence of PO intake since ER discharge with continued nausea and vomiting. She also reports worsening chest pain. Initial labs were negative for DKA, but showed AKI with sCr 1.92 and K 2.9. EKG with sinus tachycardia. She was initially treated with IV fluids, pain and nausea medications, but later found in Afib RVR.   HS troponin 28 --> 30 --> 50 --> 56 --> 52 EKG 03/09/18 with sinus tachycardia with HR 117 EKG 03/11/18 with Afib ventricular rate 132 AKI secondary to dehydration Sinus tachycardia in the 120s, converted to Afib RVR. Telemetry with Afib RVR at 0100, converted to sinus rhythm at 0730 today following 25 mg PO lopressor.   Cardiology was consulted for further management of her Afib RVR.  On my interview, she denies history of arrhythmia. She is unaware of her tachycardia and arrhythmia. She is unable to take ASA due  to hx of gastric ulcer. She is unsure if she is taking plavix or not (doesn't think so). She continues to have tenderness to palpation in the epigastric, lower central chest region. She reports orthopnea for the past 2 months. She has chronic lower extremity edema that is  often worse in the morning and improves with activity. She is generally inactive and must use a wheelchair, she does not walk due to bilateral lower extremity weakness.    Heart Pathway Score:  HEAR Score: 4  Past Medical History:  Diagnosis Date  . Acute back pain with sciatica, left   . Acute back pain with sciatica, right   . Anemia, unspecified   . Chest pain 12/2015  . Chronic pain   . Diabetes mellitus   . Esophageal reflux   . Fibromyalgia   . Gastric ulcer   . Gastroparesis   . Gout   . Hyperlipidemia   . Hypertension   . Lumbosacral stenosis   . Obesity   . Pneumonia   . [name]   . Stroke ([place]) 02/2011  . Vitamin B12 deficiency anemia     Past Surgical History:  Procedure Laterality Date  . CATARACT EXTRACTION  01/2014  . CHOLECYSTECTOMY       Home Medications:  Prior to Admission medications   Medication Sig Start Date End Date Taking? Authorizing Provider  allopurinol (ZYLOPRIM) 100 MG tablet Take 1 tablet (100 mg total) by mouth daily. 09/23/15  Yes Rizwan, Eunice Blase, MD  amLODipine (NORVASC) 10 MG tablet TAKE ONE TABLET BY MOUTH ONCE DAILY (AM) Patient taking differently: Take 10 mg by mouth daily.  03/05/19  Yes Lockamy, Timothy, DO  atorvastatin (LIPITOR) 10 MG tablet Take 1 tablet (10 mg total) by mouth daily. 12/13/18  Yes Lockamy, Christia Reading, DO  cetirizine (ZYRTEC) 10 MG tablet Take 1 tablet (10 mg total) by mouth daily. 12/13/18  Yes Lockamy, Timothy, DO  DULoxetine HCl 40 MG CPEP Take 40 mg by mouth 2 (two) times daily. 02/16/19 03/18/19 Yes Lockamy, Timothy, DO  fluticasone (FLONASE) 50 MCG/ACT nasal spray Place 2 sprays into both nostrils daily as needed for allergies or rhinitis. 12/19/18  Yes Rai, Ripudeep K, MD  furosemide (LASIX) 20 MG tablet Take 1 tablet (20 mg total) by mouth daily. Hold for 2 days 01/29/19  Yes Milus Banister C, DO  hydrALAZINE (APRESOLINE) 25 MG tablet TAKE 1 TABLET (25 MG TOTAL) BY MOUTH 3 (THREE) TIMES DAILY. FOR BLOOD  PRESSURE 03/05/19  Yes Lockamy, Timothy, DO  insulin aspart (NOVOLOG) 100 UNIT/ML injection Inject 30 Units into the skin 4 (four) times daily -  with meals and at bedtime. 01/23/19  Yes Lockamy, Timothy, DO  LANTUS SOLOSTAR 100 UNIT/ML Solostar Pen Inject 50 Units into the skin at bedtime. 01/18/19  Yes [provider]  loperamide (IMODIUM) 2 MG capsule Take 2 capsules (4 mg total) by mouth as needed for diarrhea or loose stools. 01/29/19  Yes Daisy Floro, DO  Melatonin 3 MG TABS Take 2 tablets (6 mg total) by mouth at bedtime. 12/13/18  Yes Lockamy, Timothy, DO  metoCLOPramide (REGLAN) 10 MG tablet Take 1 tablet (10 mg total) by mouth 3 (three) times daily before meals. 12/19/18  Yes Rai, Ripudeep K, MD  metoprolol succinate (TOPROL-XL) 25 MG 24 hr tablet TAKE ONE TABLET BY MOUTH ONCE DAILY Patient taking differently: Take 25 mg by mouth daily.  03/05/19  Yes Lockamy, Timothy, DO  nitroGLYCERIN (NITROSTAT) 0.4 MG SL tablet Place 1 tablet (  0.4 mg total) under the tongue every 5 (five) minutes as needed for chest pain. 12/13/18  Yes Lockamy, Timothy, DO  pantoprazole (PROTONIX) 40 MG tablet Take 1 tablet (40 mg total) by mouth daily. 03/10/19 04/09/19 Yes Long, Wonda Olds, MD  azelastine (ASTELIN) 0.1 % nasal spray Place 2 sprays into both nostrils 2 (two) times daily. Use in each nostril as directed Patient not taking: Reported on 03/12/2019 01/23/19   [name], DO  blood glucose meter kit and supplies KIT Dispense based on patient and insurance preference. Use up to four times daily as directed. (FOR ICD-9 250.00, 250.01). 12/13/18   [name], DO  dicyclomine (BENTYL) 20 MG tablet Take 1 tablet (20 mg total) by mouth 3 (three) times daily as needed for spasms (abdominal cramping). Patient not taking: Reported on 03/12/2019 03/10/19   Long, Wonda Olds, MD  ondansetron (ZOFRAN ODT) 4 MG disintegrating tablet Take 1 tablet (4 mg total) by mouth every 8 (eight) hours as needed for nausea  or vomiting. Patient not taking: Reported on 03/12/2019 03/10/19   Margette Fast, MD    Inpatient Medications: Scheduled Meds: . allopurinol  50 mg Oral Daily  . atorvastatin  10 mg Oral Daily  . DULoxetine  40 mg Oral BID  . enoxaparin (LOVENOX) injection  150 mg Subcutaneous Q12H  . insulin aspart  0-9 Units Subcutaneous Q4H  . insulin glargine  25 Units Subcutaneous QHS  . loratadine  10 mg Oral Daily  . metoprolol tartrate  25 mg Oral BID  . pantoprazole  40 mg Oral Daily  . sodium chloride flush  10-40 mL Intracatheter Q12H  . sodium chloride flush  3 mL Intravenous Once   Continuous Infusions: . sodium chloride 125 mL/hr at 03/12/19 2138  . potassium chloride     PRN Meds: fluticasone, metoCLOPramide (REGLAN) injection, morphine injection, nitroGLYCERIN, sodium chloride flush  Allergies:    Allergies  Allergen Reactions  . Contrast Media [Iodinated Diagnostic Agents] Anaphylaxis    Cardiac arrest  . Diazepam [name]  . Isovue [Iopamidol] Anaphylaxis    Patient had seizure like activity and then code post 100 cc of isovue 300  . Lisinopril Anaphylaxis    Tongue and mouth swelling  . Penicillins Palpitations    Has patient had a PCN reaction causing immediate rash, facial/tongue/throat swelling, SOB or lightheadedness with hypotension: Yes, heart races Has patient had a PCN reaction causing severe rash involving mucus membranes or skin necrosis: No Has patient had a PCN reaction that required hospitalization: Yes  Has patient had a PCN reaction occurring within the last 10 years: No   . Acetaminophen Nausea Only and Other (See Comments)    Irritates stomach ulcer  Abdominal pain  . Tolmetin Nausea Only    Other reaction(s): Other (See Comments) ULCER  . Aspirin Other (See Comments)    Irritates stomach ulcer   . Food Hives and Swelling    Carrots, ketchup   . Nsaids Other (See Comments)    ULCER  . Tramadol Nausea And Vomiting    Social History:     Social History   Socioeconomic History  . Marital status: Single    Spouse name: Not on file  . Number of children: Not on file  . Years of education: Not on file  . Highest education level: Not on file  Occupational History  . Not on file  Tobacco Use  . Smoking status: Never Smoker  . Smokeless tobacco: Never Used  Substance and Sexual Activity  . Alcohol use: No  . Drug use: No  . Sexual activity: Not Currently    Birth control/protection: None  Other Topics Concern  . Not on file  Social History Narrative   ** Merged History Encounter **       Social Determinants of Health   Financial Resource Strain:   . Difficulty of Paying Living Expenses: Not on file  Food Insecurity:   . Worried About Charity fundraiser in the Last Year: Not on file  . Ran Out of Food in the Last Year: Not on file  Transportation Needs:   . Lack of Transportation (Medical): Not on file  . Lack of Transportation (Non-Medical): Not on file  Physical Activity:   . Days of Exercise per Week: Not on file  . Minutes of Exercise per Session: Not on file  Stress:   . Feeling of Stress : Not on file  Social Connections:   . Frequency of Communication with Friends and Family: Not on file  . Frequency of Social Gatherings with Friends and Family: Not on file  . Attends Religious Services: Not on file  . Active Member of Clubs or Organizations: Not on file  . Attends Archivist Meetings: Not on file  . Marital Status: Not on file  Intimate Partner Violence:   . Fear of Current or Ex-Partner: Not on file  . Emotionally Abused: Not on file  . Physically Abused: Not on file  . Sexually Abused: Not on file    Family History:    Family History  Problem Relation Age of Onset  . Diabetes Mother   . Diabetes Father   . Heart disease Father   . Diabetes Sister   . Congestive Heart Failure Sister 15  . Diabetes Brother      ROS:  Please see the history of present illness.   All other  ROS reviewed and negative.     Physical Exam/Data:   Vitals:   03/12/19 2030 03/13/19 0112 03/13/19 0504 03/13/19 0916  BP: (!) 132/92 130/87 (!) 109/54 122/67  Pulse: 99  (!) 113 80  Resp: '12 14 16 18  ' Temp: 97.7 F (36.5 C)  98.6 F (37 C) (!) 97.4 F (36.3 C)  TempSrc: Oral  Oral Oral  SpO2: 98%  91% 93%  Weight: (!) 153 kg       Intake/Output Summary (Last 24 hours) at 03/13/2019 0945 Last data filed at 03/13/2019 0800 Gross per 24 hour  Intake 2109.6 ml  Output 0 ml  Net 2109.6 ml   Last 3 Weights 03/12/2019 03/10/2019 02/23/2019  Weight (lbs) 337 lb 4.9 oz 334 lb 332 lb 14.3 oz  Weight (kg) 153 kg 151.501 kg 151 kg     Body mass index is 54.44 kg/m.  General:  Obese female in NAD HEENT: normal Neck: no JVD Vascular: No carotid bruits  Cardiac:  Abnormal rhythm, regular rate, no murmur Lungs:  Diminished lug sounds on left, respirations unlabored, no wheezing  Abd: soft, nontender, no hepatomegaly  Ext: mild B LE edema Musculoskeletal:  No deformities, weakness in legs Skin: warm and dry  Neuro:  CNs 2-12 intact, no focal abnormalities noted Psych:  Normal affect   EKG:  The EKG was personally reviewed and demonstrates:  Sinus tachycardia HR 117, Left axis deviation Telemetry:  Telemetry was personally reviewed and demonstrates:  Afib RVR 0100-0730, now sinus rhythm in the 80-90s  Relevant CV Studies:  Echo 01/26/19:  1. Left ventricular ejection fraction, by visual estimation, is 40 to 45%. The left ventricle has moderately decreased function. There is no left ventricular hypertrophy.  2. The left ventricle demonstrates global hypokinesis.  3. Global right ventricle has normal systolic function.The right ventricular size is normal. No increase in right ventricular wall thickness.  4. EF moderately reduced, new compared to prior. Global hypokinesis, not in a focal coronary distribution.  5. Left atrial size was normal.  6. Right atrial size was normal.  7. Small  pericardial effusion.  8. The mitral valve is normal in structure. Trace mitral valve regurgitation.  9. The tricuspid valve is normal in structure. Tricuspid valve regurgitation is trivial. 10. The aortic valve is tricuspid. Aortic valve regurgitation is not visualized. Mild aortic valve sclerosis without stenosis. 11. The pulmonic valve was grossly normal. Pulmonic valve regurgitation is not visualized. 12. TR signal is inadequate for assessing pulmonary artery systolic pressure. 13. The inferior vena cava is dilated in size with >50% respiratory variability, suggesting right atrial pressure of 8 mmHg.   Lexiscan myoview 01/27/19:  There was no ST segment deviation noted during stress.  No T wave inversion was noted during stress.  Defect 1: There is a medium defect of moderate severity present in the basal anterolateral, mid anterior, mid anterolateral and apical anterior location. This appears to be most c/w breast / chest wall artifact.  This is an intermediate risk study.  Nuclear stress EF: 36%. The left ventricular ejection fraction is moderately decreased (30-44%).   Cardiac catheterization 09/2017 - normal coronaries    Echo 09/2017 - EF 45-50%  Laboratory Data:  High Sensitivity Troponin:   Recent Labs  Lab 03/10/19 0700 03/10/19 1042 03/11/19 1436 03/12/19 0405 03/12/19 0756  TROPONINIHS 28* 30* 50* 56* 52*     Chemistry Recent Labs  Lab 03/11/19 1436 03/12/19 0756 03/13/19 0430  NA 135 132* 139  K 3.5 3.7 2.9*  CL 99 95* 105  CO2 21* 21* 24  GLUCOSE 451* 430* 131*  BUN 18 24* 24*  CREATININE 1.34* 1.82* 1.92*  CALCIUM 8.5* 8.6* 7.8*  GFRNONAA 45* 31* 29*  GFRAA 52* 36* 34*  ANIONGAP 15 16* 10    Recent Labs  Lab 03/10/19 0716 03/11/19 1436 03/12/19 0756  PROT 7.5 7.0 7.3  ALBUMIN 3.5 3.1* 3.3*  AST 19 16 13*  ALT '20 16 16  ' ALKPHOS 94 89 94  BILITOT 0.9 0.6 0.8   Hematology Recent Labs  Lab 03/11/19 1436 03/12/19 0756 03/13/19 0430   WBC 13.2* 14.7* 11.7*  RBC 3.55* 3.78* 3.12*  HGB 10.2* 10.7* 8.9*  HCT 33.2* 34.3* 29.1*  MCV 93.5 90.7 93.3  MCH 28.7 28.3 28.5  MCHC 30.7 31.2 30.6  RDW 14.3 14.2 14.1  PLT 390 374 322   BNP Recent Labs  Lab 03/12/19 0756  BNP 76.8    DDimer No results for input(s): DDIMER in the last 168 hours.   Radiology/Studies:  DG Chest 2 View  Result Date: 03/11/2019 CLINICAL DATA:  Chest pain.  [name].  Nausea. EXAM: CHEST - 2 VIEW COMPARISON:  03/10/2019 FINDINGS: Both views are degraded by patient body habitus. Midline trachea. Borderline cardiomegaly. Mediastinal contours otherwise within normal limits. No pleural effusion or pneumothorax. Clear lungs. No congestive failure. Low lung volumes. IMPRESSION: Mild cardiomegaly and low lung volumes, without acute disease. Electronically Signed   By: Abigail Miyamoto M.D.   On: 03/11/2019 15:02   DG Chest 2 View  Result Date:  03/10/2019 CLINICAL DATA:  Chest pain EXAM: CHEST - 2 VIEW COMPARISON:  02/20/2019 FINDINGS: The heart size and mediastinal contours are within normal limits. Both lungs are clear. The visualized skeletal structures are unremarkable. IMPRESSION: No active cardiopulmonary disease. Electronically Signed   By: Donavan Foil M.D.   On: 03/10/2019 03:58       HEAR Score (for undifferentiated chest pain):  HEAR Score: 4    Assessment and Plan:   1. Atrial fibrillation with RVR - appears to be a new diagnosis, she is unaware of her rhythm - suspect this was precipitated by her gastroparesis, hyperglycemia, and dehydration from intractable N/V - now in sinus rhythm following 25 mg lopressor BID - continue BB, transition to succinate prior to discharge for heart failure - increase to 50 mg toprol XL - if no procedures planned, recommend eliquis 5 mg BID for a CHA2DS2-VASc Score and unadjusted Ischemic Stroke Rate (% per year)  equal to 9.7 % stroke rate/year from a score of 6 (female, HTN, CHF, DM, 2stroke) - will  need to monitor Hb given history of gastric ulcer - TSH pending - hypokalemia - K 2.9 - would keep K near 4.0 with gentle replacement given AKI - Magnesium pending   2. Chest pain 3. Hx of mild chronic troponin elevation - heart cath with normal coronaries in 2019, nonischemic lexiscan myoview 01/2019 - reassuring - hs troponin 28 --> 30 --> 50 --> 56 --> 52 - her risk factors include HTN, HLD, DM, obesity, and heart disease history in her father - suspect this troponin elevation is due to demand ischemia in the setting of N/V/dehydration, chest pain is atypical and reproducible with palpation   4. Chronic systolic heart failure - reports symptoms consistent with orthopnea, although I do not appreciate significant lower extremity edema or JVD - CXR with cardiomegaly, no congestion - BNP normal - she experienced lip and tongue swelling on lisinopril, no ARB with AKI - recommend continuing hydralazine for pressure control - she takes 20 mg lasix at home, unclear if she is taking this daily - add 15 mg imdur as pressure tolerates   5. Hypertension - home regimen includes amlodipine, hydralazine 25 mg TID, metoprolol succinate 25 mg daily - would favor D/C amlodipine and increasing toprol to 50 mg daily - favor increasing hydralazine for optimal pressure support +/-  15 mg imdur   6. Hyperlipidemia - continue 10 mg lipitor - 01/26/2019: Cholesterol 100; HDL 36; LDL Cholesterol 35; Triglycerides 147; VLDL 29   7. AKI 8. Hypokalemia - sCr 1.92 with k 2.9, was 1.42 on 03/09/18 - baseline seems to be 1.3-1.6 - replace potassium with goal of 4.0 and Mg > 2.0    For questions or updates, please contact [facility] Please consult [email or web address] for contact info under     Signed, Ledora Bottcher, PA  03/13/2019 9:45 AM As above, patient seen and examined.  Briefly she is a 54 year old female with past medical history of diabetes mellitus, hypertension, prior CVA,  hyperlipidemia, chronic stage III kidney disease, fibromyalgia, gastroparesis for evaluation of paroxysmal atrial fibrillation.  Patient had an echocardiogram at [facility] in July 2019 that showed ejection fraction of 45 to 50%.  Cardiac catheterization at that time showed normal coronary arteries.  Nuclear study November 2020 showed ejection fraction 36%, probable breast attenuation but no ischemia.  Echocardiogram November 2020 showed ejection fraction 40 to 45%, small pericardial effusion.  Patient has been admitted with chest and abdominal pain as  well as gastroparesis.  She was noted to have atrial fibrillation and cardiology asked to evaluate.  Patient denies recent palpitations.  No syncope or history of bleeding. Troponin I-28, 30, 50, 56 and 52.  Electrocardiogram shows atrial fibrillation with rapid ventricular response, left ventricular hypertrophy.  TSH is pending.  Creatinine 1.92.  Hemoglobin 8.9.  1 paroxysmal atrial fibrillation-patient has documented atrial fibrillation.  She is in sinus rhythm this morning.  TSH is pending.  Most recent echocardiogram showed ejection fraction 40 to 45%.  Increase metoprolol to 50 mg twice daily.  Transition to Toprol at time of discharge.  Multiple embolic risk factors including female sex, diabetes mellitus, hypertension, prior CVA, congestive heart failure-CHADSvasc 6.  Once all procedures complete would begin Eliquis 5 mg twice daily long-term.  Note she is asymptomatic and will likely need rate control and anticoagulation long-term.  2 cardiomyopathy-LV function mild to moderately reduced on most recent echocardiogram.  Previous catheterization showed no coronary disease.  Possibly hypertensive mediated.  She apparently had tongue swelling/mouth swelling with ACE inhibitor previously and is therefore not a candidate for ACE inhibitor, ARB or Entresto.  Will treat with hydralazine/nitrates.  She will need follow-up echoes once medications fully  titrated.  3 chest pain-appears to be musculoskeletal.  Troponin minimally elevated but no clear trend.  Previous catheterization showed no coronary disease.  No plans for further ischemia evaluation.  4 hypertension-we will treat with beta-blockade, hydralazine and nitrates if possible.  Can add back amlodipine in the future if blood pressure not controlled with above regimen.  Kirk Ruths, MD

## 2019-03-13 NOTE — Progress Notes (Addendum)
Family Medicine Teaching Service Daily Progress Note Intern Pager: [phone]  Patient name: Deborah [name] Medical record number: [id number] Date of birth: 1965/10/21 Age: 54 y.o. Gender: female  Primary Care Provider: [name], DO Consultants: None Code Status: Full  Pt Overview and Major Events to Date:  1/4-admitted  Assessment and Plan:  Nausea/vomiting/abdominal pain secondary to gastroparesis Patient states her vomiting has improved with only 3 bouts of vomiting overnight.  Continues to have some very mild abdominal discomfort but states this is also improving.  Patient states she has been able to take some of her medications p.o. and would like to try to advance her diet this morning.  She does continue to have some tachycardia as well as some slightly worsening renal function which we are continuing to monitor. -Continue Reglan for patient's gastroparesis -Can change patient's current medications IV if she is unable to tolerate p.o. -Continuous telemetry and pulse ox -Vitals per floor protocol -Normal saline at 125 mL/h -Monitor CBGs -Daily BMP -Follow-up on urine culture to rule out UTI -Can consider GI consult as patient has had several recent admissions for diabetic gastroparesis.  Atrial fibrillation with rapid ventricular response Patient with elevated heart rates since admission, however apparently last night patient's heart rates elevated into the 130s prompting an EKG which showed atrial fibrillation rapid ventricular response.  Per chart review patient does not appear to have had any history of atrial fibrillation.   -Metoprolol succinate changed to tartrate -We will check TSH and magnesium  -Will do full dose Lovenox per pharmacy to provide additional anticoagulation during work-up. -Will consult cardiology, appreciate recommendations  Hypokalemia Patient with potassium of 2.9 this morning.  Patient does not believe she would be able to take a  potassium tablets, or the liquid solution.  Will replace with 6 runs of K+. -6 runs of IV potassium -We will recheck BMP at 4 PM  Type 2 diabetes, hyperglycemia Most recent blood glucose of 135.  Patient did have a blood sugar of 84 overnight.  Most recent A1c 9.6.  Home medications include 50units/day of Lantus as well as NovoLog 4 times per day.   -Will change to 25 units Lantus -We will change to sensitive sliding scale every 4 hours -Monitor CBGs every 4 hours  AKI on CKD stage II, likely due to dehydration  Heart rate continues to be tachycardic in the 100s-110s.  Current creatinine elevated at 1.92.  Estimated GFR 34.  Creatinine since early 2019 seems to range from 1.0-1.5 per chart review.  -Avoid nephrotoxic agents -Hold Lasix -Daily BMP -Normal saline at maintenance  Hypertension Most recent blood pressure decreased at 109/54.  Home medications include metoprolol 25 mg, Lasix 20 mg, hydralazine 25 mg 3 times per day. -Patient status post IV metoprolol 5 mg every 8 hours for 3 doses. -We will transition back to p.o. metoprolol today if patient is able to tolerate -Can add hydralazine if needed for further blood pressure control  HFrEF BNP within normal limits at 76. Most recent echo shows 40-45% left ventricular ejection fraction with global hypokinesis.  Patient has a history of anaphylaxis to lisinopril.  Home medications include metoprolol succinate, Lasix 20 mg, hydralazine. -Holding lasix and hydralazine - Consider addition of Imdur or SGLT2 as outpatient.  Patient has had anaphylaxis to ACE inhibitor per chart review. -Daily weights -Monitor fluid status  Hyperlipidemia Takes 10 mg of atorvastatin -Continue home statin as patient is able  Pelvic Mass Noted in CT abdomen pelvis and October 2020.  5.1 x 4.5 cm left pelvic mass corresponding to an exophytic fibroid. Repeat imaging appears stable including CT ab/pelvis in 07/2018 and MRI in 2019 -Recommend  outpatient follow-up  Gout Medications include allopurinol 100 mg -Have reduced allopurinol 50 mg due to current AKI/renal function  Anxiety Home medications include duloxetine 30 mg,  -Continue home duloxetine as patient is able  GERD Home medications include pantoprazole 40 mg daily -Continue pantoprazole  Seasonal allergy Home medications include cetirizine, Flonase -Continue home medications   FEN/GI: IV fluid at 125 mL/h, normal saline.  Heart healthy/diabetic diet.   PPx: Lovenox   Disposition: Pending improvement of patient's symptoms  Subjective:  Patient with tachycardia overnight, noted to have what appears to be atrial fibrillation with rapid ventricular response on EKG.  Patient denies chest pains this morning.  She states she only vomited 3 times overnight and feels she is improving from that standpoint.  She still admits to some mild abdominal discomfort but states she has not had any more diarrhea.  Objective: Temp:  [97.7 F (36.5 C)-98.6 F (37 C)] 98.6 F (37 C) (01/05 0504) Pulse Rate:  [93-128] 113 (01/05 0504) Resp:  [10-28] 16 (01/05 0504) BP: (103-176)/(54-112) 109/54 (01/05 0504) SpO2:  [90 %-100 %] 91 % (01/05 0504) Weight:  [121 kg] 153 kg (01/04 2030) Physical Exam: General: Alert and oriented in no apparent distress Heart: S1, S2 present without murmurs appreciated Lungs: CTA bilaterally, no wheezing Abdomen: Bowel sounds present, mild abdominal discomfort to palpation  Laboratory: Recent Labs  Lab 03/10/19 0700 03/11/19 1436 03/12/19 0756  WBC 14.7* 13.2* 14.7*  HGB 10.8* 10.2* 10.7*  HCT 35.3* 33.2* 34.3*  PLT 381 390 374   Recent Labs  Lab 03/10/19 0700 03/10/19 0716 03/11/19 1436 03/12/19 0756  NA 136  --  135 132*  K 4.1  --  3.5 3.7  CL 99  --  99 95*  CO2 22  --  21* 21*  BUN 21*  --  18 24*  CREATININE 1.42*  --  1.34* 1.82*  CALCIUM 8.8*  --  8.5* 8.6*  PROT  --  7.5 7.0 7.3  BILITOT  --  0.9 0.6 0.8   ALKPHOS  --  94 89 94  ALT  --  20 16 16   AST  --  19 16 13*  GLUCOSE 437*  --  451* 430*      Imaging/Diagnostic Tests:   Lurline Del, DO 03/13/2019, 6:03 AM PGY-1, [place] Intern pager: [phone], text pages welcome

## 2019-03-13 NOTE — Plan of Care (Signed)
  Problem: Education: Goal: Knowledge of General Education information will improve Description Including pain rating scale, medication(s)/side effects and non-pharmacologic comfort measures Outcome: Progressing   

## 2019-03-14 ENCOUNTER — Inpatient Hospital Stay (HOSPITAL_COMMUNITY): Payer: Medicare Other

## 2019-03-14 DIAGNOSIS — I5032 Chronic diastolic (congestive) heart failure: Secondary | ICD-10-CM

## 2019-03-14 DIAGNOSIS — N182 Chronic kidney disease, stage 2 (mild): Secondary | ICD-10-CM

## 2019-03-14 LAB — BASIC METABOLIC PANEL
Anion gap: 9 (ref 5–15)
BUN: 25 mg/dL — ABNORMAL HIGH (ref 6–20)
CO2: 18 mmol/L — ABNORMAL LOW (ref 22–32)
Calcium: 7.6 mg/dL — ABNORMAL LOW (ref 8.9–10.3)
Chloride: 108 mmol/L (ref 98–111)
Creatinine, Ser: 1.7 mg/dL — ABNORMAL HIGH (ref 0.44–1.00)
GFR calc Af Amer: 39 mL/min — ABNORMAL LOW (ref >60)
GFR calc non Af Amer: 34 mL/min — ABNORMAL LOW (ref >60)
Glucose, Bld: 258 mg/dL — ABNORMAL HIGH (ref 70–99)
Potassium: 3.6 mmol/L (ref 3.5–5.1)
Sodium: 135 mmol/L (ref 135–145)

## 2019-03-14 LAB — URINE CULTURE: Culture: >=100000 — AB

## 2019-03-14 LAB — CBC
HCT: 28.7 % — ABNORMAL LOW (ref 36.0–46.0)
Hemoglobin: 8.7 g/dL — ABNORMAL LOW (ref 12.0–15.0)
MCH: 28.6 pg (ref 26.0–34.0)
MCHC: 30.3 g/dL (ref 30.0–36.0)
MCV: 94.4 fL (ref 80.0–100.0)
Platelets: 321 10*3/uL (ref 150–400)
RBC: 3.04 MIL/uL — ABNORMAL LOW (ref 3.87–5.11)
RDW: 14 % (ref 11.5–15.5)
WBC: 11.2 10*3/uL — ABNORMAL HIGH (ref 4.0–10.5)
nRBC: 0 % (ref 0.0–0.2)

## 2019-03-14 LAB — GLUCOSE, CAPILLARY
Glucose-Capillary: 206 mg/dL — ABNORMAL HIGH (ref 70–99)
Glucose-Capillary: 214 mg/dL — ABNORMAL HIGH (ref 70–99)
Glucose-Capillary: 220 mg/dL — ABNORMAL HIGH (ref 70–99)
Glucose-Capillary: 244 mg/dL — ABNORMAL HIGH (ref 70–99)
Glucose-Capillary: 249 mg/dL — ABNORMAL HIGH (ref 70–99)
Glucose-Capillary: 262 mg/dL — ABNORMAL HIGH (ref 70–99)

## 2019-03-14 LAB — MAGNESIUM: Magnesium: 1.7 mg/dL (ref 1.7–2.4)

## 2019-03-14 MED ORDER — PANTOPRAZOLE SODIUM 40 MG PO TBEC
40.0000 mg | DELAYED_RELEASE_TABLET | Freq: Two times a day (BID) | ORAL | Status: DC
  Administered 2019-03-14 – 2019-03-17 (×6): 40 mg via ORAL
  Filled 2019-03-14 (×6): qty 1

## 2019-03-14 MED ORDER — CEPHALEXIN 250 MG PO CAPS
250.0000 mg | ORAL_CAPSULE | Freq: Four times a day (QID) | ORAL | Status: DC
  Administered 2019-03-14 – 2019-03-15 (×4): 250 mg via ORAL
  Filled 2019-03-14 (×5): qty 1

## 2019-03-14 MED ORDER — MAGNESIUM SULFATE 2 GM/50ML IV SOLN
2.0000 g | Freq: Once | INTRAVENOUS | Status: AC
  Administered 2019-03-14: 2 g via INTRAVENOUS
  Filled 2019-03-14: qty 50

## 2019-03-14 NOTE — Progress Notes (Signed)
Per Dr. Francesca Oman request, have scheduled 2 week f/u in our office, 03/29/19. Appt info on AVS.

## 2019-03-14 NOTE — Plan of Care (Signed)
  Problem: Health Behavior/Discharge Planning: Goal: Ability to manage health-related needs will improve Outcome: Progressing   

## 2019-03-14 NOTE — Progress Notes (Addendum)
Family Medicine Teaching Service Daily Progress Note Intern Pager: [phone]  Patient name: Deborah [name] Medical record number: [id number] Date of birth: 1966-01-16 Age: 54 y.o. Gender: female  Primary Care Provider: [name], DO Consultants: None Code Status: Full  Pt Overview and Major Events to Date:  1/4-admitted  Assessment and Plan:  Nausea/vomiting/abdominal pain secondary to gastroparesis Patient states her vomiting has improved with only 3 bouts of vomiting overnight.  Continues to have some very mild abdominal discomfort but states this is also improving.  Patient states she has been able to take some of her medications p.o. and would like to try to advance her diet this morning.  She does continue to have some tachycardia as well as some slightly worsening renal function which we are continuing to monitor. -Continue Reglan for patient's gastroparesis -Can change patient's current medications IV if she is unable to tolerate p.o. -Continuous telemetry and pulse ox -Vitals per floor protocol -Normal saline at 125 mL/h -Monitor CBGs -Daily BMP -Follow-up on urine culture to rule out UTI -GI consult, appreciate recommendations as follows:   -Confirm no regular cannabis use.    -Twice daily PPI to address peptic acid disorders.    -Using antiemetics on a regular basis such as Zofran every 4-6 hours to address nausea.    -Continue adequate IV fluid replacement  UTI Patient with complaint of abdominal pain that seems to be located primarily in suprapubic area.  She denies dysuria but does state she has had increasing urine frequency though this may be related to the increase in IV fluids she has been provided here.  Urine culture does grow greater than 100,000 because of E. coli which were pansensitive. - We will provide Keflex for treatment of UTI, will start at 250mg  4 times per day as we await her BMP. -Renal ultrasound for signs of obstruction. -Still awaiting  patient's creatinine repeat via BMP today.  Atrial fibrillation with rapid ventricular response Patient with elevated heart rates since admission, however apparently last night patient's heart rates elevated into the 130s prompting an EKG which showed atrial fibrillation rapid ventricular response.  Per chart review patient does not appear to have had any history of atrial fibrillation.  Patient's magnesium was reduced at 1.3, this has been replaced.  TSH was within normal limits. -Metoprolol succinate changed to tartrate -Will do full dose Lovenox per pharmacy to provide additional anticoagulation during work-up. -Cardiology consult, appreciate recommendations:  -Increase metoprolol to 50 mg twice daily, transition to Toprol at discharge.  -Once all procedures are complete begin Eliquis 5 mg twice daily  -Start Imdur  -Patient should have follow-up echo once all medications are titrated  -Can add back amlodipine in the future blood pressures not controlled.   Hypokalemia Morning potassium currently pending.  Patient received 6 rounds of potassium IV yesterday. -Continue to monitor  Type 2 diabetes, hyperglycemia Most recent blood glucose of 244.  Overnight range 122-244. Most recent A1c 9.6 last month.  Home medications include 50units/day of Lantus as well as NovoLog 4 times per day.    Patient with use of 15 units insulin aspart over the last 24 hours. -Continue 25 units Lantus -Continue sensitive sliding scale every 4 hours -Monitor CBGs every 4 hours  AKI on CKD stage II, likely due to dehydration  Tachycardia has improved with heart rates within normal limits at this time.  Morning creatinine currently pending.  Yesterday's creatinine had increased to  2.17.  Creatinine since early 2019 seems to range  from 1.0-1.5 per chart review.  -Avoid nephrotoxic agents -Hold Lasix -Daily BMP -Normal saline at maintenance  Hypertension Most recent blood pressure decreased at 110/66.  Home  medications include metoprolol 25 mg, Lasix 20 mg, hydralazine 25 mg 3 times per day. -Patient status post IV metoprolol 5 mg every 8 hours for 3 doses. -See cardiology recommendations above  HFrEF BNP within normal limits at 76. Most recent echo shows 40-45% left ventricular ejection fraction with global hypokinesis.  Patient has a history of anaphylaxis to lisinopril.  Home medications include metoprolol succinate, Lasix 20 mg, hydralazine. -Holding lasix and hydralazine - Consider addition SGLT2 as outpatient.  Patient has had anaphylaxis to ACE inhibitor per chart review. -Daily weights -Monitor fluid status  Hyperlipidemia Takes 10 mg of atorvastatin -Continue home statin as patient is able  Pelvic Mass Noted in CT abdomen pelvis and October 2020.  5.1 x 4.5 cm left pelvic mass corresponding to an exophytic fibroid. Repeat imaging appears stable including CT ab/pelvis in 07/2018 and MRI in 2019 -Recommend outpatient follow-up  Gout Medications include allopurinol 100 mg -Have reduced allopurinol 50 mg due to current AKI/renal function  Anxiety Home medications include duloxetine 30 mg,  -Continue home duloxetine as patient is able  GERD Home medications include pantoprazole 40 mg daily -Continue pantoprazole  Seasonal allergy Home medications include cetirizine, Flonase -Continue home medications  FEN/GI: IV fluid at 125 mL/h, normal saline.  Heart healthy/diabetic diet.   PPx: Lovenox   Disposition: Pending improvement of patient's symptoms  Subjective:  Patient states she only had 1 bout of vomiting since yesterday but feels that her nausea is a bit worse.  She also complains of sharp chest pain that is reproducible on exam.  She states she does have some abdominal pain and on exam this does seem to be located primarily in the suprapubic area.  She denies dysuria at this time but does state that she has had an increased urine frequency, though this may be  related to the increase in fluids we have been giving her.  Objective: Temp:  [97.4 F (36.3 C)-98 F (36.7 C)] 97.8 F (36.6 C) (01/06 0422) Pulse Rate:  [80-96] 85 (01/06 0422) Resp:  [18-21] 18 (01/06 0422) BP: (108-139)/(55-90) 108/55 (01/06 0422) SpO2:  [93 %-97 %] 97 % (01/06 0422) Physical Exam: General: Alert and oriented in no apparent distress Heart: Regular rate and rhythm with no murmurs appreciated Lungs: Lung sounds faint but clear Chest/abdomen: Bowel sounds present, mild diffuse abdominal discomfort to palpation with more pronounced abdominal discomfort in the suprapubic region.  Patient does have reproducible chest pain with palpation on exam. Skin: Warm and dry   Laboratory: Recent Labs  Lab 03/12/19 0756 03/13/19 0430 03/14/19 0450  WBC 14.7* 11.7* 11.2*  HGB 10.7* 8.9* 8.7*  HCT 34.3* 29.1* 28.7*  PLT 374 322 321   Recent Labs  Lab 03/10/19 0716 03/11/19 1436 03/12/19 0756 03/13/19 0430 03/13/19 1612  NA  --  135 132* 139 135  K  --  3.5 3.7 2.9* 4.0  CL  --  99 95* 105 103  CO2  --  21* 21* 24 21*  BUN  --  18 24* 24* 25*  CREATININE  --  1.34* 1.82* 1.92* 2.17*  CALCIUM  --  8.5* 8.6* 7.8* 7.4*  PROT 7.5 7.0 7.3  --   --   BILITOT 0.9 0.6 0.8  --   --   ALKPHOS 94 89 94  --   --  ALT 20 16 16   --   --   AST 19 16 13*  --   --   GLUCOSE  --  451* 430* 131* 286*      Imaging/Diagnostic Tests:   Lurline Del, DO 03/14/2019, 6:18 AM PGY-1, [place] Intern pager: [phone], text pages welcome

## 2019-03-14 NOTE — Progress Notes (Signed)
Transitions of Care Pharmacist Note  Deborah [name] is a 54 y.o. female that has been diagnosed with A Fib and will be prescribed Eliquis (apixaban) at discharge.   Patient Education: I provided the following education on 03/14/2019 to the patient: How to take the medication Described what the medication is Signs of bleeding Signs/symptoms of VTE and stroke  Answered their questions  Discharge Medications Plan: The patient wants to have their discharge medications filled by the Transitions of Care pharmacy rather than their usual pharmacy.  The discharge orders pharmacy has been changed to the Transitions of Care pharmacy, the patient will receive a phone call regarding co-pay, and their medications will be delivered by the Transitions of Care pharmacy.    Thank you,   Lorel Monaco, PharmD PGY1 Ambulatory Care Resident [facility] # [phone] March 14, 2019

## 2019-03-14 NOTE — Plan of Care (Signed)
  Problem: Education: Goal: Knowledge of General Education information will improve Description: Including pain rating scale, medication(s)/side effects and non-pharmacologic comfort measures Outcome: Progressing   Problem: Nutrition: Goal: Adequate nutrition will be maintained Outcome: Progressing   

## 2019-03-14 NOTE — Progress Notes (Signed)
Inpatient Diabetes Program Recommendations  AACE/ADA: New Consensus Statement on Inpatient Glycemic Control (2015)  Target Ranges:  Prepandial:   less than 140 mg/dL      Peak postprandial:   less than 180 mg/dL (1-2 hours)      Critically ill patients:  140 - 180 mg/dL   Lab Results  Component Value Date   GLUCAP 214 (H) 03/14/2019   HGBA1C 9.6 (H) 02/20/2019    Review of Glycemic Control  Diabetes history: DM 2 Outpatient Diabetes medications: Novolog 30 units 4x day, Lantus 50 units qhs Current orders for Inpatient glycemic control:  Lantus 25 units  Novolog 0-9 units Q4 hours  A1c 9.6% on 12/15  Spoke with pt regarding glucose control at home. Pt goes to Dr. Garlan Fillers for DM management and sees him Q3 months. Pt last saw him in December and her A1c was 9.6%. Pt reports since then her glucose trends are still usually in the 200 range.   Glucose trends consistently 200 range. Consider increasing Lantus to 32 units.  Thanks,  Tama Headings RN, MSN, BC-ADM Inpatient Diabetes Coordinator Team Pager [phone] (8a-5p)

## 2019-03-14 NOTE — Progress Notes (Signed)
Progress Note  Patient Name: Deborah [name] Date of Encounter: 03/14/2019  Primary Cardiologist: Fransico Him, MD   Subjective   Patient denies any shortness of breath she has stable chronic chest pain.  Inpatient Medications    Scheduled Meds: . allopurinol  50 mg Oral Daily  . apixaban  5 mg Oral BID  . atorvastatin  10 mg Oral Daily  . cephALEXin  250 mg Oral Q6H  . DULoxetine  40 mg Oral BID  . hydrALAZINE  25 mg Oral Q8H  . insulin aspart  0-9 Units Subcutaneous Q4H  . insulin glargine  25 Units Subcutaneous QHS  . isosorbide mononitrate  15 mg Oral Daily  . loratadine  10 mg Oral Daily  . metoprolol tartrate  50 mg Oral BID  . pantoprazole  40 mg Oral BID  . sodium chloride flush  10-40 mL Intracatheter Q12H  . sodium chloride flush  3 mL Intravenous Once   Continuous Infusions: . sodium chloride 125 mL/hr at 03/14/19 0635   PRN Meds: dicyclomine, fluticasone, metoCLOPramide (REGLAN) injection, nitroGLYCERIN, sodium chloride flush   Vital Signs    Vitals:   03/13/19 2006 03/14/19 0422 03/14/19 0600 03/14/19 0949  BP: 139/90 (!) 108/55 110/66 113/62  Pulse: 96 85  85  Resp: 18 18    Temp: 97.6 F (36.4 C) 97.8 F (36.6 C)  (!) 97.1 F (36.2 C)  TempSrc: Oral   Axillary  SpO2: 97% 97%  97%  Weight:        Intake/Output Summary (Last 24 hours) at 03/14/2019 1309 Last data filed at 03/14/2019 0845 Gross per 24 hour  Intake 3149.25 ml  Output 0 ml  Net 3149.25 ml   Last 3 Weights 03/12/2019 03/10/2019 02/23/2019  Weight (lbs) 337 lb 4.9 oz 334 lb 332 lb 14.3 oz  Weight (kg) 153 kg 151.501 kg 151 kg     Telemetry    Sinus rhythm- Personally Reviewed  ECG    No new tracing today- Personally Reviewed  Physical Exam  Obese GEN: No acute distress.   Neck: No JVD Cardiac: RRR, 2 out of 6 systolic murmurs, rubs, or gallops.  Respiratory: Clear to auscultation bilaterally. GI: Soft, nontender, non-distended  MS: No edema; No deformity. Neuro:   Nonfocal  Psych: Normal affect   Labs    High Sensitivity Troponin:   Recent Labs  Lab 03/10/19 0700 03/10/19 1042 03/11/19 1436 03/12/19 0405 03/12/19 0756  TROPONINIHS 28* 30* 50* 56* 52*     Chemistry Recent Labs  Lab 03/10/19 0716 03/11/19 1436 03/12/19 0756 03/13/19 0430 03/13/19 1612  NA  --  135 132* 139 135  K  --  3.5 3.7 2.9* 4.0  CL  --  99 95* 105 103  CO2  --  21* 21* 24 21*  GLUCOSE  --  451* 430* 131* 286*  BUN  --  18 24* 24* 25*  CREATININE  --  1.34* 1.82* 1.92* 2.17*  CALCIUM  --  8.5* 8.6* 7.8* 7.4*  PROT 7.5 7.0 7.3  --   --   ALBUMIN 3.5 3.1* 3.3*  --   --   AST 19 16 13*  --   --   ALT 20 16 16   --   --   ALKPHOS 94 89 94  --   --   BILITOT 0.9 0.6 0.8  --   --   GFRNONAA  --  45* 31* 29* 25*  GFRAA  --  52* 36* 34*  29*  ANIONGAP  --  15 16* 10 11    Hematology Recent Labs  Lab 03/12/19 0756 03/13/19 0430 03/14/19 0450  WBC 14.7* 11.7* 11.2*  RBC 3.78* 3.12* 3.04*  HGB 10.7* 8.9* 8.7*  HCT 34.3* 29.1* 28.7*  MCV 90.7 93.3 94.4  MCH 28.3 28.5 28.6  MCHC 31.2 30.6 30.3  RDW 14.2 14.1 14.0  PLT 374 322 321   BNP Recent Labs  Lab 03/12/19 0756  BNP 76.8    DDimer No results for input(s): DDIMER in the last 168 hours.   Radiology    No results found.  Cardiac Studies     Patient Profile     54 y.o. female   [place]    1. Atrial fibrillation with RVR - appears to be a new diagnosis, she is unaware of her rhythm - suspect this was precipitated by her gastroparesis, hyperglycemia, and dehydration from intractable N/V -Metoprolol was increased to 50 mg p.o. twice daily, we will transition to Toprol-XL 50 mg daily, she was previously on 25 mg daily. - continue BB, transition to succinate prior to discharge for heart failure - increase to 50 mg toprol XL - if no procedures planned, recommend eliquis 5 mg BID for a CHA2DS2-VASc Score and unadjusted Ischemic Stroke Rate (% per year)  equal to 9.7 % stroke  rate/year from a score of 6 (female, HTN, CHF, DM, 2stroke) - will need to monitor Hb given history of gastric ulcer - TSH is normal 0.08 - hypokalemia - K 2.9 -was corrected currently 4.0  2. Chest pain 3. Hx of mild chronic troponin elevation - heart cath with normal coronaries in 2019, nonischemic lexiscan myoview 01/2019 - reassuring - hs troponin 28 --> 30 --> 50 --> 56 --> 52 - her risk factors include HTN, HLD, DM, obesity, and heart disease history in her father - suspect this troponin elevation is due to demand ischemia in the setting of N/V/dehydration, chest pain is atypical and reproducible with palpation  4. Chronic systolic heart failure - reports symptoms consistent with orthopnea, although I do not appreciate significant lower extremity edema or JVD - CXR with cardiomegaly, no congestion - BNP normal - she experienced lip and tongue swelling on lisinopril, no ARB with AKI - recommend continuing hydralazine for pressure control - she takes 20 mg lasix at home, unclear if she is taking this daily - add 15 mg imdur as pressure tolerates -Delene Loll has been held because of acute on chronic kidney failure She will need follow-up echoes once medications fully titrated.   5. Hypertension - home regimen includes amlodipine, hydralazine 25 mg TID, metoprolol succinate 25 mg daily - would favor D/C amlodipine and increasing toprol to 50 mg daily - favor increasing hydralazine for optimal pressure support +/-  15 mg imdur  6. Hyperlipidemia - continue 10 mg lipitor - 01/26/2019: Cholesterol 100; HDL 36; LDL Cholesterol 35; Triglycerides 147; VLDL 29  7. AKI  8. Hypokalemia -Resolved  9.  Hypomagnesemia -We will replace  [facility] will sign off.   Medication Recommendations: As above Other recommendations (labs, testing, etc): No further testing Follow up as an outpatient: We will arrange.  For questions or updates, please contact [place] Please  consult [email or web address] for contact info under        Signed, Ena Dawley, MD  03/14/2019, 1:09 PM

## 2019-03-15 ENCOUNTER — Inpatient Hospital Stay (HOSPITAL_COMMUNITY): Payer: Medicare Other

## 2019-03-15 DIAGNOSIS — R05 Cough: Secondary | ICD-10-CM

## 2019-03-15 DIAGNOSIS — R059 Cough, unspecified: Secondary | ICD-10-CM

## 2019-03-15 DIAGNOSIS — I509 Heart failure, unspecified: Secondary | ICD-10-CM

## 2019-03-15 LAB — GLUCOSE, CAPILLARY
Glucose-Capillary: 157 mg/dL — ABNORMAL HIGH (ref 70–99)
Glucose-Capillary: 166 mg/dL — ABNORMAL HIGH (ref 70–99)
Glucose-Capillary: 203 mg/dL — ABNORMAL HIGH (ref 70–99)
Glucose-Capillary: 233 mg/dL — ABNORMAL HIGH (ref 70–99)
Glucose-Capillary: 236 mg/dL — ABNORMAL HIGH (ref 70–99)
Glucose-Capillary: 238 mg/dL — ABNORMAL HIGH (ref 70–99)

## 2019-03-15 LAB — CBC
HCT: 28.5 % — ABNORMAL LOW (ref 36.0–46.0)
Hemoglobin: 8.7 g/dL — ABNORMAL LOW (ref 12.0–15.0)
MCH: 28.5 pg (ref 26.0–34.0)
MCHC: 30.5 g/dL (ref 30.0–36.0)
MCV: 93.4 fL (ref 80.0–100.0)
Platelets: 328 10*3/uL (ref 150–400)
RBC: 3.05 MIL/uL — ABNORMAL LOW (ref 3.87–5.11)
RDW: 14 % (ref 11.5–15.5)
WBC: 13.7 10*3/uL — ABNORMAL HIGH (ref 4.0–10.5)
nRBC: 0 % (ref 0.0–0.2)

## 2019-03-15 LAB — BASIC METABOLIC PANEL
Anion gap: 7 (ref 5–15)
BUN: 21 mg/dL — ABNORMAL HIGH (ref 6–20)
CO2: 22 mmol/L (ref 22–32)
Calcium: 8 mg/dL — ABNORMAL LOW (ref 8.9–10.3)
Chloride: 108 mmol/L (ref 98–111)
Creatinine, Ser: 1.48 mg/dL — ABNORMAL HIGH (ref 0.44–1.00)
GFR calc Af Amer: 46 mL/min — ABNORMAL LOW (ref >60)
GFR calc non Af Amer: 40 mL/min — ABNORMAL LOW (ref >60)
Glucose, Bld: 209 mg/dL — ABNORMAL HIGH (ref 70–99)
Potassium: 3.7 mmol/L (ref 3.5–5.1)
Sodium: 137 mmol/L (ref 135–145)

## 2019-03-15 LAB — RESPIRATORY PANEL BY RT PCR (FLU A&B, COVID)
Influenza A by PCR: NEGATIVE
Influenza B by PCR: NEGATIVE
SARS Coronavirus 2 by RT PCR: NEGATIVE

## 2019-03-15 LAB — MAGNESIUM: Magnesium: 1.9 mg/dL (ref 1.7–2.4)

## 2019-03-15 MED ORDER — FUROSEMIDE 10 MG/ML IJ SOLN
20.0000 mg | Freq: Once | INTRAMUSCULAR | Status: AC
  Administered 2019-03-15: 20 mg via INTRAVENOUS
  Filled 2019-03-15: qty 2

## 2019-03-15 MED ORDER — INSULIN GLARGINE 100 UNIT/ML ~~LOC~~ SOLN
30.0000 [IU] | Freq: Every day | SUBCUTANEOUS | Status: DC
  Administered 2019-03-15 – 2019-03-16 (×2): 30 [IU] via SUBCUTANEOUS
  Filled 2019-03-15 (×3): qty 0.3

## 2019-03-15 MED ORDER — CEPHALEXIN 500 MG PO CAPS: 500.0000 mg | ORAL_CAPSULE | Freq: Two times a day (BID) | ORAL | Status: DC

## 2019-03-15 MED ORDER — CEPHALEXIN 500 MG PO CAPS
500.0000 mg | ORAL_CAPSULE | Freq: Two times a day (BID) | ORAL | Status: DC
  Administered 2019-03-15 (×2): 500 mg via ORAL
  Filled 2019-03-15: qty 1

## 2019-03-15 MED ORDER — METOCLOPRAMIDE HCL 10 MG PO TABS
10.0000 mg | ORAL_TABLET | Freq: Four times a day (QID) | ORAL | Status: DC | PRN
  Administered 2019-03-17: 10 mg via ORAL
  Filled 2019-03-15: qty 1

## 2019-03-15 NOTE — Progress Notes (Signed)
Went to evaluate patient after noting vitals showed patient had a desat and was on nasal cannula.  Patient states that in addition to her cough which started yesterday she has noticed an increase in shortness of breath.  She states that normally she can get up and walk to her bathroom without trouble but since her cough she has felt a little bit more winded when doing so.  When asked if she felt her legs were more swollen than usual she was unsure though she does have 1+ pitting edema on exam.  Patient denied other complaints.  Physical exam: General: Alert and oriented, some increased work of breathing noted.  Heart: RRR, no murmurs noted Lungs: Breath sounds faint but no wheezing noted, similar to earlier exam Skin: Warm and dry Extremities: 1+ pitting edema of lower limbs bilaterally  Assessment/plan: For new shortness of breath with cough or someone in the hospital setting I certainly have concern for COVID-19 infection.  Other causes for her shortness of breath could include an exacerbation of her CHF as she has been on fluids during her hospitalization due to her vomiting prior to admission and elevated creatinine.  Cardiac etiology is also on the differential though she is not endorsing chest pain at this time. Plan: -We will get COVID-19 test -We will get chest x-ray to try to better evaluate if fluid overload versus viral infection versus other etiology seems more likely -Low threshold for cardiac work-up if patient begins endorsing chest pain or worsening shortness of breath, if this were to occur we would check an EKG and troponins  I discussed with patient the importance of letting nursing staff and myself know if she notes any increase in work of breathing/shortness of breath, chest pain, other symptoms.  I will keep a close eye on this patient.

## 2019-03-15 NOTE — Progress Notes (Signed)
Family Medicine Teaching Service Daily Progress Note Intern Pager: [phone]  Patient name: Deborah [name] Medical record number: [id number] Date of birth: 1965-07-12 Age: 54 y.o. Gender: female  Primary Care Provider: [name], DO Consultants: None Code Status: Full  Pt Overview and Major Events to Date:  1/4-admitted  Assessment and Plan:  Nausea/vomiting/abdominal pain secondary to gastroparesis Patient states her vomiting continued to improve.  She admits to 3 bouts of vomiting yesterday.  She states that her nausea and abdominal pain also seem to be improving. -Continue Reglan for patient's gastroparesis -Continuous telemetry and pulse ox -Vitals per floor protocol -Discontinued IV fluids -Monitor CBGs -Daily BMP -GI consult, appreciate recommendations as follows:   -Confirm no regular cannabis use.    -Twice daily PPI to address peptic acid disorders.    -Using antiemetics on a regular basis such as Zofran every 4-6 hours to address nausea.    -Continue adequate IV fluid replacement  Cough/shortness of breath See my earlier progress note for further details.  Patient this morning endorsing new cough, breathing well on room air when I initially saw her this morning.  However, later in the day noted that her vital signs showed she had a desat to 86% was placed on nasal cannula.   -Chest x-ray -We will repeat Covid test/RVP -Low threshold for cardiac work-up if patient develops chest pain or has worsening shortness of breath -We will continue to monitor closely  UTI Patient dorsals that her abdominal pain/suprapubic pain is improved.  Urine culture does grow greater than 100,000 because of E. coli which were pansensitive. Renal ultrasound without signs of hydronephrosis -Keflex 500 twice daily.  Atrial fibrillation with rapid ventricular response Currently normal sinus rhythm.  During patient's hospitalization she was noted to have elevated heart rates into the  130s prompting an EKG which showed atrial fibrillation rapid ventricular response.  TSH was within normal limits. -Metoprolol succinate changed to tartrate -Cardiology signed off, appreciate recommendations as follows:  -Increase metoprolol to 50 mg twice daily, transition to Toprol at discharge.  -Once all procedures are complete begin Eliquis 5 mg twice daily  -Start Imdur  -Patient should have follow-up echo once all medications are titrated  -Can add back amlodipine in the future blood pressures not controlled.   Hypokalemia Potassium 3.7 this morning -Continue to monitor  Type 2 diabetes, hyperglycemia Most recent blood glucose of 203.  Overnight range 203-249.   Patient required 18units fast acting insulin over past 24 hours.  Most recent A1c 9.6 last month.  Home medications include 50units/day of Lantus as well as NovoLog 4 times per day.    Patient with use of 15 units insulin aspart over the last 24 hours. -We will increase Lantus to 30 units. -Continue sensitive sliding scale every 4 hours -Monitor CBGs every 4 hours  AKI on CKD stage II, likely due to dehydration  Improving.  Current creatinine 1.48, improved from 2.17.  Creatinine since early 2019 seems to range from 1.0-1.5 per chart review.  -Avoid nephrotoxic agents -Hold Lasix -Daily BMP -Discontinue fluids as above  Hypertension Most recent blood pressure decreased at 144/65.  Home medications include metoprolol 25 mg, Lasix 20 mg, hydralazine 25 mg 3 times per day. -See cardiology recommendations above  HFrEF BNP within normal limits at 76. Most recent echo shows 40-45% left ventricular ejection fraction with global hypokinesis.  Patient has a history of anaphylaxis to lisinopril.  Home medications include metoprolol succinate, Lasix 20 mg, hydralazine. -See cardiology recommendations as  noted above - Consider addition SGLT2 as outpatient.  Patient has had anaphylaxis to ACE inhibitor per chart review. -Daily  weights -Monitor fluid status  Hyperlipidemia Takes 10 mg of atorvastatin -Continue home statin as patient is able  Pelvic Mass Noted in CT abdomen pelvis and October 2020.  5.1 x 4.5 cm left pelvic mass corresponding to an exophytic fibroid. Repeat imaging appears stable including CT ab/pelvis in 07/2018 and MRI in 2019 -Recommend outpatient follow-up  Gout Medications include allopurinol 100 mg -Have reduced allopurinol 50 mg due to current AKI/renal function  Anxiety Home medications include duloxetine 30 mg,  -Continue home duloxetine as patient is able  GERD Home medications include pantoprazole 40 mg daily -Continue pantoprazole  Seasonal allergy Home medications include cetirizine, Flonase -Continue home medications  FEN/GI: IV fluid at 125 mL/h, normal saline.  Heart healthy/diabetic diet.   PPx: Eliquis 5 mg twice daily  Disposition: Pending improvement of patient's symptoms  Subjective:  Patient overall continues to feel better.  She states that her abdominal pain is improving after the initiation of antibiotics yesterday.  She states that she had 3 bouts of vomiting yesterday but that she overall feels improved.  She does endorse a new cough that started last night.    We initially planned to do a COVID-19 send out as patient was expected to be discharged today, however due to patient's desat with new cough we will proceed with plan as documented above.  Objective: Temp:  [97.1 F (36.2 C)-98 F (36.7 C)] 97.7 F (36.5 C) (01/07 0504) Pulse Rate:  [81-86] 86 (01/07 0504) Resp:  [18] 18 (01/07 0504) BP: (113-144)/(58-65) 144/65 (01/07 0504) SpO2:  [93 %-97 %] 95 % (01/07 0504) Weight:  [157.9 kg] 157.9 kg (01/06 2024) Physical Exam: General: Alert and oriented in no apparent distress Heart: Regular rate and rhythm with no murmurs appreciated Lungs: Lung sounds faint due to body habitus, mild expiratory wheezing  Abdomen: Bowel sounds present, mild  abdominal discomfort to palpation, primarily located in the suprapubic area. Skin: Warm and dry  Laboratory: Recent Labs  Lab 03/13/19 0430 03/14/19 0450 03/15/19 0439  WBC 11.7* 11.2* 13.7*  HGB 8.9* 8.7* 8.7*  HCT 29.1* 28.7* 28.5*  PLT 322 321 328   Recent Labs  Lab 03/10/19 0716 03/11/19 1436 03/12/19 0756 03/13/19 1612 03/14/19 1442 03/15/19 0439  NA  --  135 132* 135 135 137  K  --  3.5 3.7 4.0 3.6 3.7  CL  --  99 95* 103 108 108  CO2  --  21* 21* 21* 18* 22  BUN  --  18 24* 25* 25* 21*  CREATININE  --  1.34* 1.82* 2.17* 1.70* 1.48*  CALCIUM  --  8.5* 8.6* 7.4* 7.6* 8.0*  PROT 7.5 7.0 7.3  --   --   --   BILITOT 0.9 0.6 0.8  --   --   --   ALKPHOS 94 89 94  --   --   --   ALT 20 16 16   --   --   --   AST 19 16 13*  --   --   --   GLUCOSE  --  451* 430* 286* 258* 209*      Imaging/Diagnostic Tests:   Lurline Del, DO 03/15/2019, 6:33 AM PGY-1, [place] Intern pager: [phone], text pages welcome

## 2019-03-15 NOTE — Care Management (Signed)
Per [place] amount for Eliquis 5mg .Bid Zero co-pay. for a 30 day supply.  No PA required Tier 3 medication Retail Pharmacy : [facility] mail order  Pt.has LIS. and medicaid.

## 2019-03-16 LAB — BASIC METABOLIC PANEL
Anion gap: 8 (ref 5–15)
BUN: 15 mg/dL (ref 6–20)
CO2: 22 mmol/L (ref 22–32)
Calcium: 8.5 mg/dL — ABNORMAL LOW (ref 8.9–10.3)
Chloride: 106 mmol/L (ref 98–111)
Creatinine, Ser: 1.43 mg/dL — ABNORMAL HIGH (ref 0.44–1.00)
GFR calc Af Amer: 48 mL/min — ABNORMAL LOW (ref >60)
GFR calc non Af Amer: 41 mL/min — ABNORMAL LOW (ref >60)
Glucose, Bld: 179 mg/dL — ABNORMAL HIGH (ref 70–99)
Potassium: 3 mmol/L — ABNORMAL LOW (ref 3.5–5.1)
Sodium: 136 mmol/L (ref 135–145)

## 2019-03-16 LAB — GLUCOSE, CAPILLARY
Glucose-Capillary: 124 mg/dL — ABNORMAL HIGH (ref 70–99)
Glucose-Capillary: 141 mg/dL — ABNORMAL HIGH (ref 70–99)
Glucose-Capillary: 141 mg/dL — ABNORMAL HIGH (ref 70–99)
Glucose-Capillary: 168 mg/dL — ABNORMAL HIGH (ref 70–99)
Glucose-Capillary: 240 mg/dL — ABNORMAL HIGH (ref 70–99)
Glucose-Capillary: 252 mg/dL — ABNORMAL HIGH (ref 70–99)

## 2019-03-16 LAB — NOVEL CORONAVIRUS, NAA (HOSP ORDER, SEND-OUT TO REF LAB; TAT 18-24 HRS): SARS-CoV-2, NAA: NOT DETECTED

## 2019-03-16 LAB — CBC
HCT: 28.2 % — ABNORMAL LOW (ref 36.0–46.0)
Hemoglobin: 8.7 g/dL — ABNORMAL LOW (ref 12.0–15.0)
MCH: 28.3 pg (ref 26.0–34.0)
MCHC: 30.9 g/dL (ref 30.0–36.0)
MCV: 91.9 fL (ref 80.0–100.0)
Platelets: 322 10*3/uL (ref 150–400)
RBC: 3.07 MIL/uL — ABNORMAL LOW (ref 3.87–5.11)
RDW: 14 % (ref 11.5–15.5)
WBC: 10.8 10*3/uL — ABNORMAL HIGH (ref 4.0–10.5)
nRBC: 0 % (ref 0.0–0.2)

## 2019-03-16 MED ORDER — CEPHALEXIN 500 MG PO CAPS
500.0000 mg | ORAL_CAPSULE | Freq: Three times a day (TID) | ORAL | Status: DC
  Administered 2019-03-16 – 2019-03-17 (×5): 500 mg via ORAL
  Filled 2019-03-16 (×5): qty 1

## 2019-03-16 MED ORDER — FUROSEMIDE 10 MG/ML IJ SOLN
20.0000 mg | Freq: Once | INTRAMUSCULAR | Status: AC
  Administered 2019-03-16: 20 mg via INTRAVENOUS
  Filled 2019-03-16: qty 2

## 2019-03-16 MED ORDER — METOPROLOL SUCCINATE ER 50 MG PO TB24
50.0000 mg | ORAL_TABLET | Freq: Every day | ORAL | Status: DC
  Administered 2019-03-16: 50 mg via ORAL
  Filled 2019-03-16: qty 1

## 2019-03-16 MED ORDER — POTASSIUM CHLORIDE CRYS ER 20 MEQ PO TBCR
40.0000 meq | EXTENDED_RELEASE_TABLET | Freq: Two times a day (BID) | ORAL | Status: AC
  Administered 2019-03-16 (×2): 40 meq via ORAL
  Filled 2019-03-16 (×2): qty 2

## 2019-03-16 NOTE — Progress Notes (Addendum)
At 1227 pt had O2 off and checked O2 saturation and it was 94%, no respiratory distress noted. Pt was left on room air. Pt refused to perform ambulatory oxygen qualification stating that she "can't walk" even though pt ambulates to the restroom independently   1340 Pt c/o shortness of breath while on RA. Pt assessed with labored breathing. O2 saturation 89% placed 1L of O2 via  and O2 saturation came up to 95%. Educated pt on the importance of the ambulatory oxygen qualification and she stated that she agrees to perform it once she feels better.

## 2019-03-16 NOTE — Care Management Important Message (Signed)
Important Message  Patient Details  Name: Deborah [name] MRN: [id number] Date of Birth: November 08, 1965   Medicare Important Message Given:  Yes     Orbie Pyo 03/16/2019, 3:27 PM

## 2019-03-16 NOTE — Progress Notes (Signed)
Family Medicine Teaching Service Daily Progress Note Intern Pager: [phone]  Patient name: Deborah [name] Medical record number: [id number] Date of birth: 02/14/1966 Age: 54 y.o. Gender: female  Primary Care Provider: [name], DO Consultants: None Code Status: Full  Pt Overview and Major Events to Date:  1/4-admitted  Assessment and Plan: Deborah [name] is a 54 y.o. female initially presenting with nausea and vomiting. PMH is significant for gastroparesis, essential hypertension, type 2 diabetes, chronic kidney disease, gout, history of stroke  Nausea/vomiting/abdominal pain secondary to gastroparesis Continue to improve.  Patient's been compliant with regard to her diabetic gastroparesis is some mild abdominal discomfort she says is improving each day. -Continue Reglan for patient's gastroparesis -Continuous telemetry and pulse ox -Vitals per floor protocol -Discontinued IV fluids -Monitor CBGs -Daily BMP -GI consult, appreciate recommendations as follows:   -Confirm no regular cannabis use.    -Twice daily PPI to address peptic acid disorders.    -Using antiemetics on a regular basis such as Zofran every 4-6 hours to address nausea.    -Continue adequate IV fluid replacement  Cough/shortness of breath secondary to fluid overload. Currently satting well on 2 L nasal cannula.  Chest x-ray from yesterday show signs of fluid overload.  Think it is likely that upon treating her dehydration and AKI we overcorrected.  Patient received IV Lasix yesterday and endorsed feeling better after this.  Reassuringly, patient's repeat Covid test is negative.  Patient's current weight is 346, patient believes her normal at home weight is around 310-315 pounds.  Per chart review it does appear that her weight in 2019 has been around 310-315 pounds. -We will provide more diuresis and continue to monitor shortness of breath - we will repeat 20 mg IV Lasix this morning and continue to  monitor patient's breathing.  If patient does well and is off oxygen can consider discharge this evening on 40 mg oral Lasix. -We will have patient ambulate with pulse ox later today. -Still waiting patient's BMP to monitor her current kidney function prior to her being discharged.  UTI Patient dorsals that her abdominal pain/suprapubic pain is improved.  Urine culture does grow greater than 100,000 because of E. coli which were pansensitive. Renal ultrasound without signs of hydronephrosis -Keflex 500 twice daily.  Atrial fibrillation with rapid ventricular response Current heart rate 90 in normal sinus rhythm.  During patient's hospitalization she was noted to have elevated heart rates into the 130s prompting an EKG which showed atrial fibrillation rapid ventricular response.  TSH was within normal limits. -Metoprolol succinate changed to tartrate -Cardiology signed off, appreciate recommendations as follows:  -Increase metoprolol to 50 mg twice daily, transition to Toprol at discharge.  -Once all procedures are complete begin Eliquis 5 mg twice daily  -Start Imdur  -Patient should have follow-up echo once all medications are titrated  -Can add back amlodipine in the future blood pressures not controlled.   Hypokalemia Potassium this morning currently pending. -Continue to monitor  Type 2 diabetes, hyperglycemia Most recent blood glucose of 141.  Overnight range 141-233. Patient required 14 units fast acting insulin over past 24 hours.  Most recent A1c 9.6 last month.  Home medications include 50units/day of Lantus as well as NovoLog 4 times per day.  Patient with use of 15 units insulin aspart over the last 24 hours. -Continue Lantus 30 units -Continue sensitive sliding scale every 4 hours, this can change to per meal as patient begins eating normally again. -Monitor CBGs every 4 hours  AKI on CKD stage II, likely due to dehydration  Improving.  Current creatinine still pending.  Was  improving from 2.17-1.48.  Creatinine since early 2019 seems to range from 1.0-1.5 per chart review.  -Daily BMP  Hypertension Most recent blood pressure normotensive at 132/73.  Home medications include metoprolol 25 mg, Lasix 20 mg, hydralazine 25 mg 3 times per day. -See cardiology recommendations above  HFrEF BNP within normal limits at 76. Most recent echo shows 40-45% left ventricular ejection fraction with global hypokinesis.  Patient has a history of anaphylaxis to lisinopril.  Home medications include metoprolol succinate, Lasix 20 mg, hydralazine. -See cardiology recommendations as noted above - Consider addition SGLT2 as outpatient.  Patient has had anaphylaxis to ACE inhibitor per chart review. -Daily weights -Monitor fluid status  Hyperlipidemia Takes 10 mg of atorvastatin -Continue home statin as patient is able  Pelvic Mass Noted in CT abdomen pelvis and October 2020.  5.1 x 4.5 cm left pelvic mass corresponding to an exophytic fibroid. Repeat imaging appears stable including CT ab/pelvis in 07/2018 and MRI in 2019 -Recommend outpatient follow-up  Gout Medications include allopurinol 100 mg -Have reduced allopurinol 50 mg due to current AKI/renal function  Anxiety Home medications include duloxetine 30 mg,  -Continue home duloxetine as patient is able  GERD Home medications include pantoprazole 40 mg daily -Continue pantoprazole  Seasonal allergy Home medications include cetirizine, Flonase -Continue home medications  FEN/GI: Heart healthy/diabetic diet.   PPx: Eliquis 5 mg twice daily  Disposition: Pending improvement of patient's symptoms  Subjective:  Patient states that she feels she is improving overall.  She states her shortness of breath is still present but is much improved from yesterday.  She does believe that she still feels a little bit fluid overloaded compared to her norm.  She is pretty sure that her normal baseline weight is around  310-315 pounds, this looks consistent from chart review through 2019.  Patient currently 346 pounds.  Objective: Temp:  [97.7 F (36.5 C)-98.2 F (36.8 C)] 97.9 F (36.6 C) (01/08 0832) Pulse Rate:  [84-90] 86 (01/08 0832) Resp:  [18] 18 (01/08 0832) BP: (132-153)/(68-75) 132/73 (01/08 0832) SpO2:  [93 %-96 %] 95 % (01/08 0832) Weight:  [157.2 kg-157.8 kg] 157.2 kg (01/08 0518) Physical Exam: General: Alert and oriented in no apparent distress, nasal cannula placed on 2 L. Heart: Regular rate and rhythm with no murmurs appreciated Lungs: CTA bilaterally, no wheezing Abdomen: Bowel sounds present, only mild abdominal discomfort to palpation today, seems improved from yesterday. Skin: Warm and dry Extremities: 1+ lower extremity pitting edema bilaterally   Laboratory: Recent Labs  Lab 03/13/19 0430 03/14/19 0450 03/15/19 0439  WBC 11.7* 11.2* 13.7*  HGB 8.9* 8.7* 8.7*  HCT 29.1* 28.7* 28.5*  PLT 322 321 328   Recent Labs  Lab 03/10/19 0716 03/11/19 1436 03/12/19 0756 03/13/19 1612 03/14/19 1442 03/15/19 0439  NA  --  135 132* 135 135 137  K  --  3.5 3.7 4.0 3.6 3.7  CL  --  99 95* 103 108 108  CO2  --  21* 21* 21* 18* 22  BUN  --  18 24* 25* 25* 21*  CREATININE  --  1.34* 1.82* 2.17* 1.70* 1.48*  CALCIUM  --  8.5* 8.6* 7.4* 7.6* 8.0*  PROT 7.5 7.0 7.3  --   --   --   BILITOT 0.9 0.6 0.8  --   --   --   ALKPHOS 94 89 94  --   --   --  ALT 20 16 16   --   --   --   AST 19 16 13*  --   --   --   GLUCOSE  --  451* 430* 286* 258* 209*    Imaging/Diagnostic Tests:   Lurline Del, DO 03/16/2019, 11:53 AM PGY-1, [place] Intern pager: [phone], text pages welcome

## 2019-03-16 NOTE — Care Management Important Message (Signed)
Important Message  Patient Details  Name: Deborah [name] MRN: [id number] Date of Birth: [birth year]   Medicare Important Message Given:  Yes     Orbie Pyo 03/16/2019, 3:28 PM

## 2019-03-17 LAB — BASIC METABOLIC PANEL
Anion gap: 11 (ref 5–15)
BUN: 13 mg/dL (ref 6–20)
CO2: 22 mmol/L (ref 22–32)
Calcium: 8.5 mg/dL — ABNORMAL LOW (ref 8.9–10.3)
Chloride: 104 mmol/L (ref 98–111)
Creatinine, Ser: 1.39 mg/dL — ABNORMAL HIGH (ref 0.44–1.00)
GFR calc Af Amer: 50 mL/min — ABNORMAL LOW (ref >60)
GFR calc non Af Amer: 43 mL/min — ABNORMAL LOW (ref >60)
Glucose, Bld: 206 mg/dL — ABNORMAL HIGH (ref 70–99)
Potassium: 3.4 mmol/L — ABNORMAL LOW (ref 3.5–5.1)
Sodium: 137 mmol/L (ref 135–145)

## 2019-03-17 LAB — GLUCOSE, CAPILLARY
Glucose-Capillary: 190 mg/dL — ABNORMAL HIGH (ref 70–99)
Glucose-Capillary: 198 mg/dL — ABNORMAL HIGH (ref 70–99)
Glucose-Capillary: 205 mg/dL — ABNORMAL HIGH (ref 70–99)
Glucose-Capillary: 219 mg/dL — ABNORMAL HIGH (ref 70–99)

## 2019-03-17 LAB — CBC
HCT: 26.7 % — ABNORMAL LOW (ref 36.0–46.0)
Hemoglobin: 8.2 g/dL — ABNORMAL LOW (ref 12.0–15.0)
MCH: 28.1 pg (ref 26.0–34.0)
MCHC: 30.7 g/dL (ref 30.0–36.0)
MCV: 91.4 fL (ref 80.0–100.0)
Platelets: 348 10*3/uL (ref 150–400)
RBC: 2.92 MIL/uL — ABNORMAL LOW (ref 3.87–5.11)
RDW: 14 % (ref 11.5–15.5)
WBC: 8.8 10*3/uL (ref 4.0–10.5)
nRBC: 0 % (ref 0.0–0.2)

## 2019-03-17 MED ORDER — PANTOPRAZOLE SODIUM 40 MG PO TBEC: 40.0000 mg | DELAYED_RELEASE_TABLET | Freq: Two times a day (BID) | ORAL | 0 refills | Status: DC

## 2019-03-17 MED ORDER — METOPROLOL SUCCINATE ER 50 MG PO TB24: 50.0000 mg | ORAL_TABLET | Freq: Every day | ORAL | 0 refills | Status: DC

## 2019-03-17 MED ORDER — POTASSIUM CHLORIDE ER 20 MEQ PO TBCR: 20.0000 meq | EXTENDED_RELEASE_TABLET | Freq: Every day | ORAL | 0 refills | Status: DC

## 2019-03-17 MED ORDER — FUROSEMIDE 40 MG PO TABS
40.0000 mg | ORAL_TABLET | Freq: Every day | ORAL | Status: DC
  Administered 2019-03-17: 40 mg via ORAL
  Filled 2019-03-17: qty 1

## 2019-03-17 MED ORDER — FUROSEMIDE 40 MG PO TABS: 40.0000 mg | ORAL_TABLET | Freq: Every day | ORAL | 0 refills | Status: DC

## 2019-03-17 MED ORDER — ALLOPURINOL 100 MG PO TABS: 50.0000 mg | ORAL_TABLET | Freq: Every day | ORAL | 0 refills | Status: DC

## 2019-03-17 MED ORDER — APIXABAN 5 MG PO TABS: 5.0000 mg | ORAL_TABLET | Freq: Two times a day (BID) | ORAL | 0 refills | Status: DC

## 2019-03-17 MED ORDER — LANTUS SOLOSTAR 100 UNIT/ML ~~LOC~~ SOPN: 30.0000 [IU] | PEN_INJECTOR | Freq: Every day | SUBCUTANEOUS | 11 refills | Status: DC

## 2019-03-17 MED ORDER — CEPHALEXIN 500 MG PO CAPS: 500.0000 mg | ORAL_CAPSULE | Freq: Three times a day (TID) | ORAL | 0 refills | Status: AC

## 2019-03-17 MED ORDER — ISOSORBIDE MONONITRATE ER 30 MG PO TB24: 15.0000 mg | ORAL_TABLET | Freq: Every day | ORAL | 0 refills | Status: DC

## 2019-03-17 NOTE — Plan of Care (Signed)
See discharged note.

## 2019-03-17 NOTE — Progress Notes (Signed)
DISCHARGE NOTE HOME K[facility] to be discharged to  per MD order. Discussed prescriptions and follow -up.Prescriptions given to patient; medication list explained in detail. Patient verbalized understanding.  Skin clean, dry and intact without evidence of skin break down, no evidence of skin tears noted. IV catheter discontinued intact. Site without signs and symptoms of complications. Dressing and pressure applied. Pt denies pain at the site currently. No complaints noted.  Patient free of lines, drains, and wounds.   An After Visit Summary (AVS) was printed and given to the patient. Patient escorted via wheelchair, and discharged home via private auto.  [place], Zenon Mayo, RN

## 2019-03-17 NOTE — Plan of Care (Signed)
  Problem: Education: Goal: Knowledge of General Education information will improve Description: Including pain rating scale, medication(s)/side effects and non-pharmacologic comfort measures Outcome: Progressing   Problem: Elimination: Goal: Will not experience complications related to bowel motility Outcome: Progressing   

## 2019-03-17 NOTE — Progress Notes (Signed)
SATURATION QUALIFICATIONS: (This note is used to comply with regulatory documentation for home oxygen)  Patient Saturations on Room Air at Rest = 98%  Patient Saturations on Room Air while Ambulating =   96%                                                                                           Patient was propelling herself while sitting in the wheelchair as instructed from m.d.and was able to do so back and fort on the hallway with some sob and heart rate of 11/min but otherwise patient was asymptomatic.  Patient Saturations on   N/A Liters of oxygen while Ambulating = N/A %  Please briefly explain why patient needs home oxygen:

## 2019-03-17 NOTE — Progress Notes (Signed)
Family Medicine Teaching Service Daily Progress Note Intern Pager: [phone]  Patient name: LORRAYNE ISMAEL Medical record number: [id number] Date of birth: 01-14-1966 Age: 54 y.o. Gender: female  Primary Care Provider: [name], DO Consultants: None Code Status: Full  Pt Overview and Major Events to Date:  1/4-admitted  Assessment and Plan: RIVKAH WOLZ is a 54 y.o. female initially presenting with nausea and vomiting. PMH is significant for gastroparesis, essential hypertension, type 2 diabetes, chronic kidney disease, gout, history of stroke  Nausea/vomiting/abdominal pain secondary to gastroparesis Continue to improve.  Patient's been compliant with regard to her diabetic gastroparesis is some mild abdominal discomfort she says is improving each day. -Continue Reglan for patient's gastroparesis -Continuous telemetry and pulse ox -Vitals per floor protocol -Discontinued IV fluids -Monitor CBGs -Daily BMP -GI consult, appreciate recommendations as follows:   -Confirm no regular cannabis use.    -Twice daily PPI to address peptic acid disorders.    -Using antiemetics on a regular basis such as Zofran every 4-6 hours to address nausea.    -Continue adequate IV fluid replacement  Cough/shortness of breath secondary to fluid overload. Currently satting well room air.  Chest x-ray from 1/7 showed signs of fluid overload.  Think it is likely that upon treating her dehydration and AKI we overcorrected.  Patient received IV Lasix yesterday and endorsed feeling better after this.  Reassuringly, patient's repeat Covid test is negative.  Patient's current weight is 346, patient believes her normal at home weight is around 310-315 pounds.  Per chart review it does appear that her weight in 2019 has been around 310-315 pounds. -We will provide more diuresis and continue to monitor shortness of breath -Patient continues to do well with her direct diuresis.  We will start 40 mg  p.o. Lasix today, patient's normal home dose is 20 mg oral Lasix.  Patient can likely discharge on 40 mg p.o. Lasix if continues to do well with close outpatient follow-up. -Patient refused ambulation with pulse ox yesterday. -Creatinine continues to improve.  Most recent 1.43.  UTI Patient dorsals that her abdominal pain/suprapubic pain is improved.  Urine culture does grow greater than 100,000 because of E. coli which were pansensitive. Renal ultrasound without signs of hydronephrosis -Keflex 500 every 8 hours.  Atrial fibrillation with rapid ventricular response, resolved Current heart rate 93 normal sinus rhythm.  During patient's hospitalization she was noted to have elevated heart rates into the 130s prompting an EKG which showed atrial fibrillation rapid ventricular response.  TSH was within normal limits. -Metoprolol succinate changed to tartrate -Cardiology signed off, appreciate recommendations as follows:  -Increase metoprolol to 50 mg twice daily, transition to Toprol at discharge.  -Once all procedures are complete begin Eliquis 5 mg twice daily  -Start Imdur  -Patient should have follow-up echo once all medications are titrated  -Can add back amlodipine in the future blood pressures not controlled.   Hypokalemia Potassium this morning currently pending.  Yesterday's was 3.0 was replaced with oral potassium 58meq. -Continue to monitor  Type 2 diabetes, hyperglycemia Most recent blood glucose of 205.  Overnight range 124-252. Patient required 15 units fast acting insulin over past 24 hours.  Most recent A1c 9.6 last month.  Home medications include 50units/day of Lantus as well as NovoLog 4 times per day.  Patient with use of 15 units insulin aspart over the last 24 hours. -Continue Lantus 30 units -Continue sensitive sliding scale every 4 hours, this can change to per meal as patient  begins eating normally again. -Monitor CBGs every 4 hours  AKI on CKD stage II, likely due  to dehydration  Improving.  Current creatinine 1.43.  Creatinine since early 2019 seems to range from 1.0-1.5 per chart review.  -Daily BMP  Hypertension Most recent blood pressure normotensive at 123/79.  Home medications include metoprolol 25 mg, Lasix 20 mg, hydralazine 25 mg 3 times per day. -See cardiology recommendations above  HFrEF BNP within normal limits at 76. Most recent echo shows 40-45% left ventricular ejection fraction with global hypokinesis.  Patient has a history of anaphylaxis to lisinopril.  Home medications include metoprolol succinate, Lasix 20 mg, hydralazine. -See cardiology recommendations as noted above - Consider addition SGLT2 as outpatient.  Patient has had anaphylaxis to ACE inhibitor per chart review. -Daily weights -Monitor fluid status  Hyperlipidemia Takes 10 mg of atorvastatin -Continue home statin as patient is able  Pelvic Mass Noted in CT abdomen pelvis and October 2020.  5.1 x 4.5 cm left pelvic mass corresponding to an exophytic fibroid. Repeat imaging appears stable including CT ab/pelvis in 07/2018 and MRI in 2019 -Recommend outpatient follow-up  Gout Medications include allopurinol 100 mg -Have reduced allopurinol 50 mg due to current AKI/renal function  Anxiety Home medications include duloxetine 30 mg,  -Continue home duloxetine as patient is able  GERD Home medications include pantoprazole 40 mg daily -Continue pantoprazole  Seasonal allergy Home medications include cetirizine, Flonase -Continue home medications  FEN/GI: Heart healthy/diabetic diet.   PPx: Eliquis 5 mg twice daily  Disposition: Likely discharge home within the next 24 hours  Subjective:  Patient states that she does feel a bit short of breath this morning while breathing on room air.  She endorses that she has not vomited in in the past 24 hours and though she still has some nausea and abdominal pains it seems to be improving.  She states her  suprapubic discomfort has dramatically improved.  I discussed with patient that I feel she is still slightly fluid overloaded and that is likely the cause of her shortness of breath.  I expressed to the patient the importance of an ambulatory pulse ox test so we can see how she does while moving around.  I expressed to the patient that if she does well with this I believe she could be discharged home today with close outpatient follow-up as she continues her diuresis.  Objective: Temp:  [97.5 F (36.4 C)-98.2 F (36.8 C)] 97.6 F (36.4 C) (01/09 0520) Pulse Rate:  [76-114] 93 (01/09 0440) Resp:  [16-20] 20 (01/09 0440) BP: (109-133)/(61-79) 123/79 (01/09 0440) SpO2:  [89 %-95 %] 94 % (01/09 0440) Weight:  [160.6 kg] 160.6 kg (01/08 2018) Physical Exam: General: Alert and oriented in no apparent distress Heart: Regular rate and rhythm with no murmurs appreciated Lungs: CTA bilaterally, no wheezing, breathing well on room air Abdomen: Bowel sounds present, mild abdominal discomfort to palpation, no peritoneal signs. Skin: Warm and dry Extremities: Trace lower limb edema, improved from yesterday.  Laboratory: Recent Labs  Lab 03/14/19 0450 03/15/19 0439 03/16/19 1215  WBC 11.2* 13.7* 10.8*  HGB 8.7* 8.7* 8.7*  HCT 28.7* 28.5* 28.2*  PLT 321 328 322   Recent Labs  Lab 03/10/19 0716 03/11/19 1436 03/12/19 0756 03/14/19 1442 03/15/19 0439 03/16/19 1215  NA  --  135 132* 135 137 136  K  --  3.5 3.7 3.6 3.7 3.0*  CL  --  99 95* 108 108 106  CO2  --  21* 21* 18* 22 22  BUN  --  18 24* 25* 21* 15  CREATININE  --  1.34* 1.82* 1.70* 1.48* 1.43*  CALCIUM  --  8.5* 8.6* 7.6* 8.0* 8.5*  PROT 7.5 7.0 7.3  --   --   --   BILITOT 0.9 0.6 0.8  --   --   --   ALKPHOS 94 89 94  --   --   --   ALT 20 16 16   --   --   --   AST 19 16 13*  --   --   --   GLUCOSE  --  451* 430* 258* 209* 179*    Imaging/Diagnostic Tests:   Lurline Del, DO 03/17/2019, 6:20 AM PGY-1, [place] Intern pager: [phone], text pages welcome

## 2019-03-17 NOTE — Discharge Instructions (Signed)
Dear [name],  Thank you for letting us participate in your care.   POST-HOSPITAL & CARE INSTRUCTIONS 1.  We are discharging you on an increased dose of Lasix, 40 mg/day to help with continue to pull additional fluid off.  I would like for you to follow-up with your primary care doctor or someone at our clinic within the next few days to determine if we should increase this dose, keep it the same, or reduce it back to your normal home dose. 2. We also decreased your allopurinol to half a tablet, and removed your amlodipine as we initiated a new medication called Imdur for your blood pressure. 3.  We also reduced your insulin to 30 units of Lantus because your blood sugars were better in the hospital than normal.  We are removing your insulin around meals for the time being because you only needed a small amount of this while in the hospital and we do not want your blood sugars to go too low.  Your PCP will discuss this with you and determine how much insulin you should use after discharge. 4. We have you scheduled for an appointment with Dr. Garlan Fillers for follow-up on 1/11 at 1145.  Please arrive 15 minutes early.  Take care and be well!   Information on my medicine - ELIQUIS (apixaban)  This medication education was reviewed with me or my healthcare representative as part of my discharge preparation.  The pharmacist that spoke with me during my hospital stay was:    Why was Eliquis prescribed for you? Eliquis was prescribed for you to reduce the risk of a blood clot forming that can cause a stroke if you have a medical condition called atrial fibrillation (a type of irregular heartbeat).  What do You need to know about Eliquis ? Take your Eliquis TWICE DAILY - one tablet in the morning and one tablet in the evening with or without food. If you have difficulty swallowing the tablet whole please discuss with your pharmacist how to take the medication safely.  Take Eliquis exactly as  prescribed by your doctor and DO NOT stop taking Eliquis without talking to the doctor who prescribed the medication.  Stopping may increase your risk of developing a stroke.  Refill your prescription before you run out.  After discharge, you should have regular check-up appointments with your healthcare provider that is prescribing your Eliquis.  In the future your dose may need to be changed if your kidney function or weight changes by a significant amount or as you get older.  What do you do if you miss a dose? If you miss a dose, take it as soon as you remember on the same day and resume taking twice daily.  Do not take more than one dose of ELIQUIS at the same time to make up a missed dose.  Important Safety Information A possible side effect of Eliquis is bleeding. You should call your healthcare provider right away if you experience any of the following: ? Bleeding from an injury or your nose that does not stop. ? Unusual colored urine (red or dark brown) or unusual colored stools (red or black). ? Unusual bruising for unknown reasons. ? A serious fall or if you hit your head (even if there is no bleeding).  Some medicines may interact with Eliquis and might increase your risk of bleeding or clotting while on Eliquis. To help avoid this, consult your healthcare provider or pharmacist prior to using any new prescription  or non-prescription medications, including herbals, vitamins, non-steroidal anti-inflammatory drugs (NSAIDs) and supplements.  This website has more information on Eliquis (apixaban): http://www.eliquis.com/eliquis/home

## 2019-03-19 ENCOUNTER — Inpatient Hospital Stay: Payer: Medicare Other | Admitting: Family Medicine

## 2019-03-27 ENCOUNTER — Encounter: Payer: Self-pay | Admitting: Physician Assistant

## 2019-03-27 NOTE — Progress Notes (Deleted)
Cardiology Office Note    Date:  03/27/2019   ID:  AUBRIONNA ISTRE, DOB 1966-02-15, MRN [id number]  PCP:  [name], DO  Cardiologist:  Fransico Him, MD  Electrophysiologist:  None   Chief Complaint: f/u CHF, atrial fib  History of Present Illness:   SEMIAH KONCZAL is a 54 y.o. female with history of HTN, gastroparesis, DM, obesity, hyperlipidemia, stroke 2012, CKD stage III, fibromyalgia, chronic pain, chronic edema, anxiety, and recently diagnosed atrial fibrillation, chronic appearing anemia who is here for post-hospital follow-up.  Ms. Zeringue has been seen by cardiology several times since 2017 for chest pain with multiple prior cardiac tests, occasionally revealing decreased ejection fraction. She underwent cardiac cath at [facility] 09/14/17 which revealed normal coronaries. Echo at that time revealed EF of 45-50%. She has had recent ER visits for chest pain and hypertension, generally in the setting of N/V/gastroparesis with borderline elevated hsTroponins at times. Lexiscan myoview on 01/27/19 showed medium defect of moderate severity in the anterolateral and apical anterior location felt to be consistent with breast attenuation vs chest wall artifact. Stress test read as intermediate risk due to EF of 36%. Follow up echocardiogram revealed EF of 40-45% with global hypokinesis without focal coronary distribution. Normal left and right atrial size. She was seen in the ER on 02/20/19 with chest pain and intractable vomiting with abdominal pain. She had epigastric tenderness and tenderness to palpation of her chest. She was treated for hyperglycemia and gastroparesis. Given her atypical pain reproducible with palpation and mildly elevated but flat troponin, no further cardiac workup was completed at that time. She reported back to the ER on 03/10/19 with intractable nausea, vomiting, dehydration, abdominal pain, and chest pain. Symptoms were treated and she was discharged from  the ER. Unfortunately, she reported back to the ER on 03/12/19 and waiting 17.5 hr in the waiting room to be seen. She reported near absence of PO intake since ER discharge with continued nausea and vomiting. She also reported worsening chest pain. Initial labs were negative for DKA, but showed AKI with sCr 1.92 and K 2.9. She presented in sinus tachy but later had episode of transient atrial fib with HR of 132. She spontaneously converted. Metoprolol was adjusted and she was placed on Eliquis. Her electrolytes were repleted. Amlodipine was discontinued in lieu of HF optimization. She also required brief diuresis by IM team. Last labs personally reviewed include 03/17/19 Hgb 8.2, K 3.4, Cr 1.39, Covid negative, Mg 1.9 (initially 1.3), TSH wnl, troponins low/flat.  recheck BMEt, MG, CBC lipids - 35 ldl 01/2019  Paroxysmal atrial fibrillation Chronic systolic CHF/NICM Anemia Essential HTN Hyperlipidemia    Past Medical History:  Diagnosis Date  . Acute back pain with sciatica, left   . Acute back pain with sciatica, right   . AKI (acute kidney injury) ([place])   . Anemia, unspecified   . Chest pain 12/2015  . Chronic pain   . Chronic systolic CHF (congestive heart failure) ([place])   . Diabetes mellitus   . Esophageal reflux   . Fibromyalgia   . Gastric ulcer   . Gastroparesis   . Gout   . Hyperlipidemia   . Hypertension   . Hypokalemia   . Hypomagnesemia   . Lumbosacral stenosis   . NICM (nonischemic cardiomyopathy) ([place])   . Obesity   . PAF (paroxysmal atrial fibrillation) ([place])   . Pneumonia   . Stroke ([place]) 02/2011  . Vitamin B12 deficiency anemia     Past  Surgical History:  Procedure Laterality Date  . CATARACT EXTRACTION  01/2014  . CHOLECYSTECTOMY      Current Medications: No outpatient medications have been marked as taking for the 03/29/19 encounter (Appointment) with Charlie Pitter, PA-C.   ***   Allergies:   Contrast media [iodinated diagnostic agents], Diazepam,  Isovue [iopamidol], Lisinopril, Penicillins, Acetaminophen, Tolmetin, Aspirin, Food, Nsaids, and Tramadol   Social History   Socioeconomic History  . Marital status: Single    Spouse name: Not on file  . Number of children: Not on file  . Years of education: Not on file  . Highest education level: Not on file  Occupational History  . Not on file  Tobacco Use  . Smoking status: Never Smoker  . Smokeless tobacco: Never Used  Substance and Sexual Activity  . Alcohol use: No  . Drug use: No  . Sexual activity: Not Currently    Birth control/protection: None  Other Topics Concern  . Not on file  Social History Narrative   ** Merged History Encounter **       Social Determinants of Health   Financial Resource Strain:   . Difficulty of Paying Living Expenses: Not on file  Food Insecurity:   . Worried About Charity fundraiser in the Last Year: Not on file  . Ran Out of Food in the Last Year: Not on file  Transportation Needs:   . Lack of Transportation (Medical): Not on file  . Lack of Transportation (Non-Medical): Not on file  Physical Activity:   . Days of Exercise per Week: Not on file  . Minutes of Exercise per Session: Not on file  Stress:   . Feeling of Stress : Not on file  Social Connections:   . Frequency of Communication with Friends and Family: Not on file  . Frequency of Social Gatherings with Friends and Family: Not on file  . Attends Religious Services: Not on file  . Active Member of Clubs or Organizations: Not on file  . Attends Archivist Meetings: Not on file  . Marital Status: Not on file     Family History:  The patient's ***family history includes Congestive Heart Failure (age of onset: 29) in her sister; Diabetes in her brother, father, mother, and sister; Heart disease in her father.  ROS:   Please see the history of present illness. Otherwise, review of systems is positive for ***.  All other systems are reviewed and otherwise  negative.    EKGs/Labs/Other Studies Reviewed:    Studies reviewed are outlined and summarized above.  Cath [facility] 09/2017   Discussed with patient (but may still be drowsy).  Angiographically normal coronary arteries.  No significant aortic valve gradient on catheter pullback.  Cath showed angiographically normal arteries. LVEDP 60mmHg. EF is 65%.  Wall motion is normal. There is no procedure complication. Loss of blood  is <5 ml. No tissue sample is removed.   2D echo 01/2019  1. Left ventricular ejection fraction, by visual estimation, is 40 to 45%. The left ventricle has moderately decreased function. There is no left ventricular hypertrophy.  2. The left ventricle demonstrates global hypokinesis.  3. Global right ventricle has normal systolic function.The right ventricular size is normal. No increase in right ventricular wall thickness.  4. EF moderately reduced, new compared to prior. Global hypokinesis, not in a focal coronary distribution.  5. Left atrial size was normal.  6. Right atrial size was normal.  7. Small pericardial effusion.  8. The mitral valve is normal in structure. Trace mitral valve regurgitation.  9. The tricuspid valve is normal in structure. Tricuspid valve regurgitation is trivial. 10. The aortic valve is tricuspid. Aortic valve regurgitation is not visualized. Mild aortic valve sclerosis without stenosis. 11. The pulmonic valve was grossly normal. Pulmonic valve regurgitation is not visualized. 12. TR signal is inadequate for assessing pulmonary artery systolic pressure. 13. The inferior vena cava is dilated in size with >50% respiratory variability, suggesting right atrial pressure of 8 mmHg.    EKG:  EKG is ordered today, personally reviewed, demonstrating ***  Recent Labs: 03/12/2019: ALT 16; B Natriuretic Peptide 76.8 03/13/2019: TSH 2.085 03/15/2019: Magnesium 1.9 03/17/2019: BUN 13; Creatinine, Ser 1.39; Hemoglobin 8.2; Platelets 348; Potassium 3.4;  Sodium 137  Recent Lipid Panel    Component Value Date/Time   CHOL 100 01/26/2019 1313   CHOL 106 12/13/2018 0000   TRIG 147 01/26/2019 1313   HDL 36 (L) 01/26/2019 1313   HDL 36 (L) 12/13/2018 0000   CHOLHDL 2.8 01/26/2019 1313   VLDL 29 01/26/2019 1313   LDLCALC 35 01/26/2019 1313   LDLCALC 38 12/13/2018 0000    PHYSICAL EXAM:    VS:  LMP 10/10/2012   BMI: There is no height or weight on file to calculate BMI.  GEN: Well nourished, well developed, in no acute distress HEENT: normocephalic, atraumatic Neck: no JVD, carotid bruits, or masses Cardiac: ***RRR; no murmurs, rubs, or gallops, no edema  Respiratory:  clear to auscultation bilaterally, normal work of breathing GI: soft, nontender, nondistended, + BS MS: no deformity or atrophy Skin: warm and dry, no rash Neuro:  Alert and Oriented x 3, Strength and sensation are intact, follows commands Psych: euthymic mood, full affect  Wt Readings from Last 3 Encounters:  03/16/19 (!) 354 lb 0.9 oz (160.6 kg)  03/10/19 (!) 334 lb (151.5 kg)  02/23/19 (!) 332 lb 14.3 oz (151 kg)     ASSESSMENT & PLAN:   1. ***  Disposition: F/u with ***   Medication Adjustments/Labs and Tests Ordered: Current medicines are reviewed at length with the patient today.  Concerns regarding medicines are outlined above. Medication changes, Labs and Tests ordered today are summarized above and listed in the Patient Instructions accessible in Encounters.   Signed, Charlie Pitter, PA-C  03/27/2019 5:39 PM    [facility] [address], [place], [place]  [address] Phone: [phone]; Fax: [phone]

## 2019-03-29 ENCOUNTER — Ambulatory Visit: Payer: Medicare Other | Admitting: Physician Assistant

## 2019-03-30 ENCOUNTER — Other Ambulatory Visit: Payer: Self-pay | Admitting: Family Medicine

## 2019-03-30 ENCOUNTER — Telehealth: Payer: Self-pay

## 2019-03-30 MED ORDER — FLUCONAZOLE 150 MG PO TABS: 150.0000 mg | ORAL_TABLET | Freq: Once | ORAL | 0 refills | Status: AC

## 2019-03-30 NOTE — Telephone Encounter (Signed)
Patient calls nurse line stating she was recently in hospitalized and given Keflex for a UTI. Patient stated now she has developed a yeast infection. Patient endorses thick vaginal discharge, itchiness, and tenderness. Patient stated 1 tablet of diflucan usually dose the trick. Please advise.

## 2019-03-30 NOTE — Progress Notes (Signed)
Diflucan ordered for patient as she called stating she has yeast infection with symptoms similar in the past. She is on Keflex for UTI.

## 2019-04-05 ENCOUNTER — Other Ambulatory Visit: Payer: Self-pay

## 2019-04-05 ENCOUNTER — Telehealth (INDEPENDENT_AMBULATORY_CARE_PROVIDER_SITE_OTHER): Payer: Medicare Other | Admitting: Family Medicine

## 2019-04-05 DIAGNOSIS — E1143 Type 2 diabetes mellitus with diabetic autonomic (poly)neuropathy: Secondary | ICD-10-CM

## 2019-04-05 DIAGNOSIS — R6889 Other general symptoms and signs: Secondary | ICD-10-CM | POA: Insufficient documentation

## 2019-04-05 DIAGNOSIS — K3184 Gastroparesis: Secondary | ICD-10-CM | POA: Diagnosis not present

## 2019-04-05 MED ORDER — DICYCLOMINE HCL 20 MG PO TABS: 20.0000 mg | ORAL_TABLET | Freq: Three times a day (TID) | ORAL | 0 refills | Status: DC | PRN

## 2019-04-05 MED ORDER — LOPERAMIDE HCL 2 MG PO TABS: 2.0000 mg | ORAL_TABLET | Freq: Four times a day (QID) | ORAL | 0 refills | Status: DC | PRN

## 2019-04-05 NOTE — Assessment & Plan Note (Signed)
Beyond nausea, patient has no current issues.  Major concern is to make sure that her refills are done on time so that she does not "get behind it all "  Refilled Bentyl and Imodium at chronic doses.

## 2019-04-05 NOTE — Assessment & Plan Note (Signed)
Patient advised that she was likely passed the isolation.  At this point she has been having the symptoms for 3 weeks but that she could get a Covid test if she wanted to for contract tracing purposes.  We discussed emergency precautions and gave her information to go to the Covid testing center

## 2019-04-05 NOTE — Telephone Encounter (Signed)
Pt calls nurse line requesting refills on medications that she was prescribed in the hospital.   Refills requested for bentyl and imodium 2mg  tablets. Imodium is not on current meds list.   Please advise  To PCP  Talbot Grumbling, RN

## 2019-04-05 NOTE — Progress Notes (Signed)
Attempted to connect with video which did not work, called on phone and patient did not pick up.  Left VM saying to call back when she would like to schedule time with [place] and to go to the ED if she feels her condition is an emergency  -Dr. Joyc[facility] Telemedicine Visit  Patient consented to have virtual visit. Method of visit: Video  Encounter participants: Patient: Deborah [name] - located at home Provider: Sherene Sires - located at home telemedicine Others (if applicable):   Chief Complaint: Flu symptoms  HPI: Patient said for the last 3-week she has had flu symptoms including some nasal and sinus congestion as well as cough and muscle aches.  She said intermittently she has had issues with fever but that has resolved.  She says she is not in respiratory distress and firmly denies any indication that she would need to go to the emergency department and feels she is safe at home.  Major concern is to find out if she should get a Covid test and refill of her medicines because she has been having a little bit of nausea lately and with a significant history of gastroparesis she wants to make sure that she has her Bentyl and Imodium.  She has had no vomiting and does not feel that she is in a gastroparesis crisis at this point.  Says she has felt tired but is able to get around the house and do her daily activities  ROS: per HPI  Pertinent PMHx: Gastroparesis  Exam:  Respiratory: Patient did have a mild cough a few times during call but was otherwise speaking in full sentences and was not processing information appropriately.  Does not appear to be in respiratory distress  Assessment/Plan:  Flu-like symptoms Patient advised that she was likely passed the isolation.  At this point she has been having the symptoms for 3 weeks but that she could get a Covid test if she wanted to for contract tracing purposes.  We discussed emergency precautions and gave  her information to go to the Covid testing center  Diabetic gastroparesis ([place]) Beyond nausea, patient has no current issues.  Major concern is to make sure that her refills are done on time so that she does not "get behind it all "  Refilled Bentyl and Imodium at chronic doses.    Time spent during visit with patient: 12 minutes        Recommended patient schedule an appointment to be tested in one of the following ways:  Text "COVID" to [phone] OR log onto [email or web address]. Patient can also call [phone].   There are three locations for testing, hours vary.   [facility] ([facility] in [place]) [address] [place], [place] [address] or [place] ([place]) [place], [place] [address] or [address] (near [place] in [place]) [place], [place] [address]  Patient advised of hours 8am-3:30pm and that they should be in line by 3 PM. -ED precautions discussed and patient expressed good understanding -Patient counseled on wearing a mask and frequently washing hands -Patient instructed to avoid others until they meet criteria for ending isolation after any suspected COVID, which are:  -24 hours with no fever (without medications) and  -Respiratory symptoms have resolved (e.g. cough, shortness of breath) and -10 days since symptoms first appeared

## 2019-04-06 ENCOUNTER — Other Ambulatory Visit: Payer: Self-pay | Admitting: Family Medicine

## 2019-04-06 ENCOUNTER — Telehealth: Payer: Self-pay

## 2019-04-06 ENCOUNTER — Ambulatory Visit (INDEPENDENT_AMBULATORY_CARE_PROVIDER_SITE_OTHER): Payer: Medicare Other | Admitting: Family Medicine

## 2019-04-06 VITALS — BP 140/82 | HR 100 | Temp 97.7°F | Ht 66.0 in | Wt 356.0 lb

## 2019-04-06 DIAGNOSIS — D638 Anemia in other chronic diseases classified elsewhere: Secondary | ICD-10-CM | POA: Diagnosis not present

## 2019-04-06 DIAGNOSIS — Z1152 Encounter for screening for COVID-19: Secondary | ICD-10-CM

## 2019-04-06 DIAGNOSIS — I509 Heart failure, unspecified: Secondary | ICD-10-CM | POA: Diagnosis not present

## 2019-04-06 DIAGNOSIS — J40 Bronchitis, not specified as acute or chronic: Secondary | ICD-10-CM

## 2019-04-06 DIAGNOSIS — R05 Cough: Secondary | ICD-10-CM

## 2019-04-06 DIAGNOSIS — E1143 Type 2 diabetes mellitus with diabetic autonomic (poly)neuropathy: Secondary | ICD-10-CM

## 2019-04-06 DIAGNOSIS — E1165 Type 2 diabetes mellitus with hyperglycemia: Secondary | ICD-10-CM

## 2019-04-06 DIAGNOSIS — R059 Cough, unspecified: Secondary | ICD-10-CM

## 2019-04-06 DIAGNOSIS — Z794 Long term (current) use of insulin: Secondary | ICD-10-CM

## 2019-04-06 DIAGNOSIS — K3184 Gastroparesis: Secondary | ICD-10-CM

## 2019-04-06 MED ORDER — AZITHROMYCIN 250 MG PO TABS: ORAL_TABLET | ORAL | 0 refills | Status: DC

## 2019-04-06 MED ORDER — ALBUTEROL SULFATE (2.5 MG/3ML) 0.083% IN NEBU: 2.5000 mg | INHALATION_SOLUTION | Freq: Four times a day (QID) | RESPIRATORY_TRACT | 1 refills | Status: AC | PRN

## 2019-04-06 MED ORDER — LOPERAMIDE HCL 2 MG PO TABS: 2.0000 mg | ORAL_TABLET | Freq: Four times a day (QID) | ORAL | 0 refills | Status: AC | PRN

## 2019-04-06 MED ORDER — IPRATROPIUM BROMIDE 0.03 % NA SOLN: 2.0000 | Freq: Two times a day (BID) | NASAL | 0 refills | Status: DC

## 2019-04-06 MED ORDER — DICYCLOMINE HCL 20 MG PO TABS: 20.0000 mg | ORAL_TABLET | Freq: Three times a day (TID) | ORAL | 0 refills | Status: DC | PRN

## 2019-04-06 MED ORDER — BENZONATATE 100 MG PO CAPS: 100.0000 mg | ORAL_CAPSULE | Freq: Three times a day (TID) | ORAL | 0 refills | Status: DC | PRN

## 2019-04-06 NOTE — Telephone Encounter (Signed)
Patient calls nurse line with request for rx for albuterol  nebulizer solution for breathing treatments.  Patient states that she is having difficulty breathing and that in the past the breathing treatments worked for her.   Advised patient to go to the ED for difficulty breathing. Pt declined at this time due to 15 hour wait times.   Pt scheduled at pulmonary clinic per Dr. Andria Frames.   Talbot Grumbling, RN

## 2019-04-06 NOTE — Progress Notes (Signed)
Prescription refilled per patient request. She was given these in the hospital.

## 2019-04-06 NOTE — Patient Instructions (Addendum)
Your COVID 19 results will be available in 48-72 hours. Negative results are immediately resulted to Mychart. All positive results are communicated with a phone call from our office.  Follow-up here in the clinic 04/12/19 for evaluation of your symptoms.  You will be notified during clinic hours of your labs results.   If any of your symptoms worsen go immediately to the ER for evaluation.    COVID-19 Frequently Asked Questions COVID-19 (coronavirus disease) is an infection that is caused by a large family of viruses. Some viruses cause illness in people and others cause illness in animals like camels, cats, and bats. In some cases, the viruses that cause illness in animals can spread to humans. Where did the coronavirus come from? In December 2019, [place] told the [facility]) of several cases of lung disease (human respiratory illness). These cases were linked to an open seafood and livestock market in the city of [place]. The link to the seafood and livestock market suggests that the virus may have spread from animals to humans. However, since that first outbreak in December, the virus has also been shown to spread from person to person. What is the name of the disease and the virus? Disease name Early on, this disease was called novel coronavirus. This is because scientists determined that the disease was caused by a new (novel) respiratory virus. The World Health Organization [facility]) has now named the disease COVID-19, or coronavirus disease. Virus name The virus that causes the disease is called severe acute respiratory syndrome coronavirus 2 (SARS-CoV-2). More information on disease and virus naming World Health Organization [facility]): www.who.int/emergencies/diseases/novel-coronavirus-2019/technical-guidance/naming-the-coronavirus-disease-(covid-2019)-and-the-virus-that-causes-it Who is at risk for complications from coronavirus disease? Some people may be at higher risk for  complications from coronavirus disease. This includes older adults and people who have chronic diseases, such as heart disease, diabetes, and lung disease. If you are at higher risk for complications, take these extra precautions:  Stay home as much as possible.  Avoid social gatherings and travel.  Avoid close contact with others. Stay at least 6 ft (2 m) away from others, if possible.  Wash your hands often with soap and water for at least 20 seconds.  Avoid touching your face, mouth, nose, or eyes.  Keep supplies on hand at home, such as food, medicine, and cleaning supplies.  If you must go out in public, wear a cloth face covering or face mask. Make sure your mask covers your nose and mouth. How does coronavirus disease spread? The virus that causes coronavirus disease spreads easily from person to person (is contagious). You may catch the virus by:  Breathing in droplets from an infected person. Droplets can be spread by a person breathing, speaking, singing, coughing, or sneezing.  Touching something, like a table or a doorknob, that was exposed to the virus (contaminated) and then touching your mouth, nose, or eyes. Can I get the virus from touching surfaces or objects? There is still a lot that we do not know about the virus that causes coronavirus disease. Scientists are basing a lot of information on what they know about similar viruses, such as:  Viruses cannot generally survive on surfaces for long. They need a human body (host) to survive.  It is more likely that the virus is spread by close contact with people who are sick (direct contact), such as through: ? Shaking hands or hugging. ? Breathing in respiratory droplets that travel through the air. Droplets can be spread by a person breathing,  speaking, singing, coughing, or sneezing.  It is less likely that the virus is spread when a person touches a surface or object that has the virus on it (indirect contact). The  virus may be able to enter the body if the person touches a surface or object and then touches his or her face, eyes, nose, or mouth. Can a person spread the virus without having symptoms of the disease? It may be possible for the virus to spread before a person has symptoms of the disease, but this is most likely not the main way the virus is spreading. It is more likely for the virus to spread by being in close contact with people who are sick and breathing in the respiratory droplets spread by a person breathing, speaking, singing, coughing, or sneezing. What are the symptoms of coronavirus disease? Symptoms vary from person to person and can range from mild to severe. Symptoms may include:  Fever or chills.  Cough.  Difficulty breathing or feeling short of breath.  Headaches, body aches, or muscle aches.  Runny or stuffy (congested) nose.  Sore throat.  New loss of taste or smell.  Nausea, vomiting, or diarrhea. These symptoms can appear anywhere from 2 to 14 days after you have been exposed to the virus. Some people may not have any symptoms. If you develop symptoms, call your health care provider. People with severe symptoms may need hospital care. Should I be tested for this virus? Your health care provider will decide whether to test you based on your symptoms, history of exposure, and your risk factors. How does a health care provider test for this virus? Health care providers will collect samples to send for testing. Samples may include:  Taking a swab of fluid from the back of your nose and throat, your nose, or your throat.  Taking fluid from the lungs by having you cough up mucus (sputum) into a sterile cup.  Taking a blood sample. Is there a treatment or vaccine for this virus? Currently, there is no vaccine to prevent coronavirus disease. Also, there are no medicines like antibiotics or antivirals to treat the virus. A person who becomes sick is given supportive care,  which means rest and fluids. A person may also relieve his or her symptoms by using over-the-counter medicines that treat sneezing, coughing, and runny nose. These are the same medicines that a person takes for the common cold. If you develop symptoms, call your health care provider. People with severe symptoms may need hospital care. What can I do to protect myself and my family from this virus?     You can protect yourself and your family by taking the same actions that you would take to prevent the spread of other viruses. Take the following actions:  Wash your hands often with soap and water for at least 20 seconds. If soap and water are not available, use alcohol-based hand sanitizer.  Avoid touching your face, mouth, nose, or eyes.  Cough or sneeze into a tissue, sleeve, or elbow. Do not cough or sneeze into your hand or the air. ? If you cough or sneeze into a tissue, throw it away immediately and wash your hands.  Disinfect objects and surfaces that you frequently touch every day.  Stay away from people who are sick.  Avoid going out in public, follow guidance from your state and local health authorities.  Avoid crowded indoor spaces. Stay at least 6 ft (2 m) away from others.  If you  must go out in public, wear a cloth face covering or face mask. Make sure your mask covers your nose and mouth.  Stay home if you are sick, except to get medical care. Call your health care provider before you get medical care. Your health care provider will tell you how long to stay home.  Make sure your vaccines are up to date. Ask your health care provider what vaccines you need. What should I do if I need to travel? Follow travel recommendations from your local health authority, the CDC, and WHO. Travel information and advice  Centers for Disease Control and Prevention (CDC): BodyEditor.hu  World Health Organization [facility]):  ThirdIncome.ca Know the risks and take action to protect your health  You are at higher risk of getting coronavirus disease if you are traveling to areas with an outbreak or if you are exposed to travelers from areas with an outbreak.  Wash your hands often and practice good hygiene to lower the risk of catching or spreading the virus. What should I do if I am sick? General instructions to stop the spread of infection  Wash your hands often with soap and water for at least 20 seconds. If soap and water are not available, use alcohol-based hand sanitizer.  Cough or sneeze into a tissue, sleeve, or elbow. Do not cough or sneeze into your hand or the air.  If you cough or sneeze into a tissue, throw it away immediately and wash your hands.  Stay home unless you must get medical care. Call your health care provider or local health authority before you get medical care.  Avoid public areas. Do not take public transportation, if possible.  If you can, wear a mask if you must go out of the house or if you are in close contact with someone who is not sick. Make sure your mask covers your nose and mouth. Keep your home clean  Disinfect objects and surfaces that are frequently touched every day. This may include: ? Counters and tables. ? Doorknobs and light switches. ? Sinks and faucets. ? Electronics such as phones, remote controls, keyboards, computers, and tablets.  Wash dishes in hot, soapy water or use a dishwasher. Air-dry your dishes.  Wash laundry in hot water. Prevent infecting other household members  Let healthy household members care for children and pets, if possible. If you have to care for children or pets, wash your hands often and wear a mask.  Sleep in a different bedroom or bed, if possible.  Do not share personal items, such as razors, toothbrushes, deodorant, combs, brushes, towels, and washcloths. Where to find  more information Centers for Disease Control and Prevention (CDC)  Information and news updates: https://www.butler-gonzalez.com/ World Health Organization [facility])  Information and news updates: MissExecutive.com.ee  Coronavirus health topic: https://www.castaneda.info/  Questions and answers on COVID-19: OpportunityDebt.at  Global tracker: who.sprinklr.com American Academy of Pediatrics (AAP)  Information for families: www.healthychildren.org/English/health-issues/conditions/chest-lungs/Pages/2019-Novel-Coronavirus.aspx The coronavirus situation is changing rapidly. Check your local health authority website or the CDC and [facility] websites for updates and news. When should I contact a health care provider?  Contact your health care provider if you have symptoms of an infection, such as fever or cough, and you: ? Have been near anyone who is known to have coronavirus disease. ? Have come into contact with a person who is suspected to have coronavirus disease. ? Have traveled to an area where there is an outbreak of COVID-19. When should I get emergency medical care?  Get help right away by calling your local emergency services (911 in the [place] if you have: ? Trouble breathing. ? Pain or pressure in your chest. ? Confusion. ? Blue-tinged lips and fingernails. ? Difficulty waking from sleep. ? Symptoms that get worse. Let the emergency medical personnel know if you think you have coronavirus disease. Summary  A new respiratory virus is spreading from person to person and causing COVID-19 (coronavirus disease).  The virus that causes COVID-19 appears to spread easily. It spreads from one person to another through droplets from breathing, speaking, singing, coughing, or sneezing.  Older adults and those with chronic diseases are at higher risk of disease. If you are at higher risk for complications, take extra  precautions.  There is currently no vaccine to prevent coronavirus disease. There are no medicines, such as antibiotics or antivirals, to treat the virus.  You can protect yourself and your family by washing your hands often, avoiding touching your face, and covering your coughs and sneezes. This information is not intended to replace advice given to you by your health care provider. Make sure you discuss any questions you have with your health care provider. Document Revised: 12/22/2018 Document Reviewed: 06/20/2018 Elsevier Patient Education  [place] of Breath, Adult [name] means you have trouble breathing. [name] could be a sign of a medical problem. Follow these instructions at home:   Watch for any changes in your symptoms.  Do not use any products that contain nicotine or tobacco, such as cigarettes, e-cigarettes, and chewing tobacco.  Do not smoke. Smoking can cause shortness of breath. If you need help to quit smoking, ask your doctor.  Avoid things that can make it harder to breathe, such as: ? Mold. ? Dust. ? Air pollution. ? Chemical smells. ? Things that can cause allergy symptoms (allergens), if you have allergies.  Keep your living space clean. Use products that help remove mold and dust.  Rest as needed. Slowly return to your normal activities.  Take over-the-counter and prescription medicines only as told by your doctor. This includes oxygen therapy and inhaled medicines.  Keep all follow-up visits as told by your doctor. This is important. Contact a doctor if:  Your condition does not get better as soon as expected.  You have a hard time doing your normal activities, even after you rest.  You have new symptoms. Get help right away if:  Your shortness of breath gets worse.  You have trouble breathing when you are resting.  You feel light-headed or you pass out (faint).  You have a cough that is not  helped by medicines.  You cough up blood.  You have pain with breathing.  You have pain in your chest, arms, shoulders, or belly (abdomen).  You have a fever.  You cannot walk up stairs.  You cannot exercise the way you normally do. These symptoms may represent a serious problem that is an emergency. Do not wait to see if the symptoms will go away. Get medical help right away. Call your local emergency services (911 in the [place] Do not drive yourself to the hospital. Summary  [name] is when you have trouble breathing enough air. It can be a sign of a medical problem.  Avoid things that make it hard for you to breathe, such as smoking, pollution, mold, and dust.  Watch for any changes in your symptoms. Contact your doctor if you do not get better or  you get worse. This information is not intended to replace advice given to you by your health care provider. Make sure you discuss any questions you have with your health care provider. Document Revised: 07/25/2017 Document Reviewed: 07/25/2017 Elsevier Patient Education  [place].

## 2019-04-06 NOTE — Progress Notes (Signed)
.  Patient ID: Deborah [name], female    DOB: [birth year], 54 y.o.   MRN: [id number]  PCP: [name], DO  Chief Complaint  Patient presents with  . COVID 19  TEST    Subjective:  HPI Deborah [name] is a 54 y.o. female presents to [facility] clinic for evaluation of symptoms related of shortness of breath, productive cough, sinus congestion.  Patient was admitted to the hospital on 03/17/19 for CHF, Uncontrolled Diabetes, Gastroparesis causing N&V. She had a telemedicine visit with PCP and was concern for possible COVID-19 infection as she has experiencing worsening SOB, cough, and sinus congestion. Cough is occasionally productive, present x 3 3 weeks. [name] has remained intermittent for 1 week, however, has worsened within the last 24 hours. Endorses facial pressure from sinus congestions. No fever, chills, nausea, or vomiting.  Reports no unintentional weight gain or worsening edema. Report checking blood sugars at home and they've been "good" similar to readings at discharge from patient.  She denies any abdominal pain and loose stools.  Review of Systems Pertinent negatives listed in HPI  Patient Active Problem List   Diagnosis Date Noted  . Flu-like symptoms 04/05/2019  . Cough   . Acute on chronic congestive heart failure ([place])   . History of gastric ulcer   . Uncontrolled type 2 diabetes mellitus with hyperglycemia ([place])   . Fibroid uterus 02/23/2019  . Congestion of nasal sinus 01/24/2019  . Chronic diastolic heart failure ([place]) 12/19/2018  . Intractable nausea and vomiting 12/17/2018  . Hypoxia 12/17/2018  . Hyperglycemia 12/17/2018  . Elevated troponin I level   . Vomiting 07/18/2018  . Abdominal pain 07/17/2018  . Hyperkalemia 07/17/2018  . Cardiac arrest ([place]) 11/29/2017  . Pelvic mass 11/29/2017  . Leukocytosis 11/29/2017  . Anxiety 11/29/2017  . Allergic reaction to contrast media, initial encounter 11/28/2017  . Palliative  care encounter   . Back pain 03/19/2017  . Stroke (cerebrum) ([place]) 03/19/2017  . GERD (gastroesophageal reflux disease) 03/19/2017  . Depression 03/19/2017  . Obesity   . Urinary tract infection 08/16/2016  . Normocytic normochromic anemia 08/16/2016  . Gastroparesis 08/16/2016  . Intractable vomiting with nausea 06/17/2016  . Diabetic gastroparesis ([place]) 06/05/2016  . Gout 06/05/2016  . AKI (acute kidney injury) ([place]) 12/06/2015  . Chest pain 09/26/2015  . Hypokalemia 09/26/2015  . Hypomagnesemia 09/26/2015  . Nausea and vomiting 08/20/2015  . Gout flare 05/27/2015  . Lower abdominal pain 05/26/2015  . DKA (diabetic ketoacidoses) ([place]) 05/25/2015  . Uncontrolled type 2 diabetes mellitus with diabetic neuropathy, with long-term current use of insulin ([place]) 05/25/2015  . Type 2 diabetes mellitus with hyperglycemia, with long-term current use of insulin ([place]) 05/25/2015  . CKD (chronic kidney disease), stage II 05/25/2015  . Essential hypertension, benign 09/28/2013      Prior to Admission medications   Medication Sig Start Date End Date Taking? Authorizing Provider  allopurinol (ZYLOPRIM) 100 MG tablet Take 0.5 tablets (50 mg total) by mouth daily. 03/18/19   Lurline Del, DO  apixaban (ELIQUIS) 5 MG TABS tablet Take 1 tablet (5 mg total) by mouth 2 (two) times daily. 03/17/19   Lurline Del, DO  atorvastatin (LIPITOR) 10 MG tablet Take 1 tablet (10 mg total) by mouth daily. 12/13/18   [name], DO  blood glucose meter kit and supplies KIT Dispense based on patient and insurance preference. Use up to four times daily as directed. (FOR ICD-9 250.00, 250.01). 12/13/18   Lockamy,  Timothy, DO  cetirizine (ZYRTEC) 10 MG tablet Take 1 tablet (10 mg total) by mouth daily. 12/13/18   [name], DO  dicyclomine (BENTYL) 20 MG tablet Take 1 tablet (20 mg total) by mouth 3 (three) times daily as needed for spasms (abdominal cramping). 04/06/19 05/06/19  [name], DO   DULoxetine HCl 40 MG CPEP Take 40 mg by mouth 2 (two) times daily. 02/16/19 03/18/19  [name], DO  fluticasone (FLONASE) 50 MCG/ACT nasal spray Place 2 sprays into both nostrils daily as needed for allergies or rhinitis. 12/19/18   Rai, Vernelle Emerald, MD  furosemide (LASIX) 40 MG tablet Take 1 tablet (40 mg total) by mouth daily. 03/18/19   Lurline Del, DO  hydrALAZINE (APRESOLINE) 25 MG tablet TAKE 1 TABLET (25 MG TOTAL) BY MOUTH 3 (THREE) TIMES DAILY. FOR BLOOD PRESSURE 03/05/19   [name], DO  isosorbide mononitrate (IMDUR) 30 MG 24 hr tablet Take 0.5 tablets (15 mg total) by mouth daily. 03/18/19   Welborn, Ryan, DO  LANTUS SOLOSTAR 100 UNIT/ML Solostar Pen Inject 30 Units into the skin at bedtime. 03/17/19   Lurline Del, DO  loperamide (IMODIUM A-D) 2 MG tablet Take 1 tablet (2 mg total) by mouth 4 (four) times daily as needed for diarrhea or loose stools. 04/06/19 05/06/19  [name], DO  Melatonin 3 MG TABS Take 2 tablets (6 mg total) by mouth at bedtime. 12/13/18   [name], DO  metoCLOPramide (REGLAN) 10 MG tablet Take 1 tablet (10 mg total) by mouth 3 (three) times daily before meals. 12/19/18   Rai, Vernelle Emerald, MD  metoprolol succinate (TOPROL-XL) 50 MG 24 hr tablet Take 1 tablet (50 mg total) by mouth daily. Take with or immediately following a meal. 03/17/19   Welborn, Ryan, DO  nitroGLYCERIN (NITROSTAT) 0.4 MG SL tablet Place 1 tablet (0.4 mg total) under the tongue every 5 (five) minutes as needed for chest pain. 12/13/18   Lockamy, Timothy, DO  ondansetron (ZOFRAN ODT) 4 MG disintegrating tablet Take 1 tablet (4 mg total) by mouth every 8 (eight) hours as needed for nausea or vomiting. Patient not taking: Reported on 03/12/2019 03/10/19   Long, Wonda Olds, MD  pantoprazole (PROTONIX) 40 MG tablet Take 1 tablet (40 mg total) by mouth 2 (two) times daily. 03/17/19 04/16/19  Lurline Del, DO  potassium chloride 20 MEQ TBCR Take 20 mEq by mouth daily. 03/17/19   Lurline Del,  DO    Past Medical, Surgical Family and Social History reviewed and updated.    Objective:  There were no vitals filed for this visit.  Wt Readings from Last 3 Encounters:  03/16/19 (!) 354 lb 0.9 oz (160.6 kg)  03/10/19 (!) 334 lb (151.5 kg)  02/23/19 (!) 332 lb 14.3 oz (151 kg)    Physical Exam Vitals reviewed.  Constitutional:      Appearance: She is obese. She is ill-appearing. She is not toxic-appearing.  HENT:     Head: Normocephalic.     Nose: Congestion and rhinorrhea present.     Mouth/Throat:     Mouth: Mucous membranes are moist.  Eyes:     Extraocular Movements: Extraocular movements intact.     Conjunctiva/sclera: Conjunctivae normal.     Pupils: Pupils are equal, round, and reactive to light.  Cardiovascular:     Rate and Rhythm: Normal rate and regular rhythm.     Pulses: Normal pulses.     Heart sounds: Normal heart sounds.  Pulmonary:  Effort: Pulmonary effort is normal.  Musculoskeletal:     Cervical back: Normal range of motion.  Lymphadenopathy:     Cervical: No cervical adenopathy.  Skin:    General: Skin is warm and dry.  Neurological:     General: No focal deficit present.  Psychiatric:        Mood and Affect: Mood normal.       Assessment & Plan:  1. Encounter for screening for COVID-19 -COVID-19 test pending.  2. Anemia of chronic disease -Repeat CBC   3. Cough -Benzonatate 100-200 mg 3 times daily  PRN as needed.  4. Congestive heart failure, unspecified HF chronicity, unspecified heart failure type ([place]) -Continue to weigh daily. If your weight increases > 2.5 lbs within 24 hours, call your cardiologist and or PCP for further instruction.  5. Bronchitis - Start Azithromycin Take 2 tabs x 1 dose, then 1 tab every day for x 4 days  6. Type 2 diabetes mellitus with hyperglycemia, with long-term current use of insulin ([facility]) -Continue current therapy.  -Monitor blood sugar . I blood sugar readings > 200 consistently, follow-up  with PCP   Meds ordered this encounter  Medications  . azithromycin (ZITHROMAX) 250 MG tablet    Sig: Take 2 tabs x 1 dose, then 1 tab every day for x 4 days    Dispense:  6 tablet    Refill:  0    Multiple drug allergies  . ipratropium (ATROVENT) 0.03 % nasal spray    Sig: Place 2 sprays into both nostrils 2 (two) times daily.    Dispense:  30 mL    Refill:  0  . albuterol (PROVENTIL) (2.5 MG/3ML) 0.083% nebulizer solution    Sig: Take 3 mLs (2.5 mg total) by nebulization every 6 (six) hours as needed for wheezing or shortness of breath.    Dispense:  150 mL    Refill:  1  . benzonatate (TESSALON) 100 MG capsule    Sig: Take 1-2 capsules (100-200 mg total) by mouth 3 (three) times daily as needed for cough.    Dispense:  40 capsule    Refill:  0      -The patient was given clear instructions to go to ER or return to medical center if symptoms do not improve, worsen or new problems develop. The patient verbalized understanding.     Molli Barrows, FNP-C [facility], PRN Provider  [facility]. [place], [place] [facility] Phone: [phone] Clinic Fax: [phone] Clinic Hours: 5:30 pm -7:30 pm (Monday-Friday)

## 2019-04-07 LAB — CBC WITH DIFFERENTIAL/PLATELET
Basophils Absolute: 0.1 10*3/uL (ref 0.0–0.2)
Basos: 1 %
EOS (ABSOLUTE): 0.6 10*3/uL — ABNORMAL HIGH (ref 0.0–0.4)
Eos: 6 %
Hematocrit: 31.8 % — ABNORMAL LOW (ref 34.0–46.6)
Hemoglobin: 10.3 g/dL — ABNORMAL LOW (ref 11.1–15.9)
Immature Grans (Abs): 0 10*3/uL (ref 0.0–0.1)
Immature Granulocytes: 0 %
Lymphocytes Absolute: 2.9 10*3/uL (ref 0.7–3.1)
Lymphs: 27 %
MCH: 28.8 pg (ref 26.6–33.0)
MCHC: 32.4 g/dL (ref 31.5–35.7)
MCV: 89 fL (ref 79–97)
Monocytes Absolute: 0.6 10*3/uL (ref 0.1–0.9)
Monocytes: 5 %
Neutrophils Absolute: 6.9 10*3/uL (ref 1.4–7.0)
Neutrophils: 61 %
Platelets: 462 10*3/uL — ABNORMAL HIGH (ref 150–450)
RBC: 3.58 x10E6/uL — ABNORMAL LOW (ref 3.77–5.28)
RDW: 13.2 % (ref 11.7–15.4)
WBC: 11.1 10*3/uL — ABNORMAL HIGH (ref 3.4–10.8)

## 2019-04-07 LAB — BASIC METABOLIC PANEL
BUN/Creatinine Ratio: 13 (ref 9–23)
BUN: 23 mg/dL (ref 6–24)
CO2: 16 mmol/L — ABNORMAL LOW (ref 20–29)
Calcium: 8.9 mg/dL (ref 8.7–10.2)
Chloride: 105 mmol/L (ref 96–106)
Creatinine, Ser: 1.71 mg/dL — ABNORMAL HIGH (ref 0.57–1.00)
GFR calc Af Amer: 38 mL/min/{1.73_m2} — ABNORMAL LOW (ref >59)
GFR calc non Af Amer: 33 mL/min/{1.73_m2} — ABNORMAL LOW (ref >59)
Glucose: 400 mg/dL — ABNORMAL HIGH (ref 65–99)
Potassium: 3.4 mmol/L — ABNORMAL LOW (ref 3.5–5.2)
Sodium: 135 mmol/L (ref 134–144)

## 2019-04-07 LAB — NOVEL CORONAVIRUS, NAA: SARS-CoV-2, NAA: NOT DETECTED

## 2019-04-09 ENCOUNTER — Telehealth: Payer: Self-pay | Admitting: Family Medicine

## 2019-04-09 ENCOUNTER — Other Ambulatory Visit: Payer: Medicare Other

## 2019-04-09 ENCOUNTER — Ambulatory Visit: Payer: Medicare Other

## 2019-04-09 NOTE — Telephone Encounter (Signed)
Left a voicemail for patient to return my call (working at [facility] today) regarding her labs collect during her visit at [facility] respiratory clinic on 04/06/19. Patient needs to go to the ER concern that she is likely SOB related to be DKA. Mildly elevated WBC. Negative for COVID. Renal functioning has worsened.    Molli Barrows, FNP-C [facility], PRN Provider  [facility]. [place], [place] [facility] Phone: [phone] Clinic Fax: [phone] Clinic Hours: 5:30 pm -7:30 pm (Monday, Wednesday, and Friday)

## 2019-04-09 NOTE — Progress Notes (Signed)
Left a voicemail for patient to return my call (working at [facility] today) regarding her labs collected during her visit at [facility] respiratory clinic on 04/06/19. Patient needs to go to the ER concern that she is likely SOB related to be DKA. Mildly elevated WBC. Negative for COVID. Renal functioning has worsened.

## 2019-04-09 NOTE — Telephone Encounter (Signed)
Spoke with patient to advise of abnormal lab results and recommendation for evaluation at the emergency department. Patient verbalized understanding will go to either [facility] or [facility] depending on wait times.  Molli Barrows, FNP-C [facility], PRN Provider  [facility]. [place], [place] [facility] Phone: [phone] Clinic Fax: [phone] Clinic Hours: 5:30 pm -7:30 pm (Monday, Wednesday, and Friday)

## 2019-04-12 ENCOUNTER — Ambulatory Visit: Payer: Medicare Other

## 2019-04-19 ENCOUNTER — Other Ambulatory Visit: Payer: Self-pay | Admitting: Family Medicine

## 2019-04-28 IMAGING — MR MR LUMBAR SPINE W/O CM
4 of 5 series · 21 of 48 positions shown · non-contrast
Comparison: 07/04/2016 CT abdomen and pelvis

CLINICAL DATA: 51 y/o F; lower back pain and weakness down both
legs for 1 week.

EXAM:
MRI LUMBAR SPINE WITHOUT CONTRAST
TECHNIQUE: Multiplanar, multisequence MR imaging of the lumbar spine was
performed. No intravenous contrast was administered.

[Series 3: T2 · sagittal · 4.5mm · 0.55mm/px · 6 of 17 slices shown (1 of 2)]
[im 1/17]
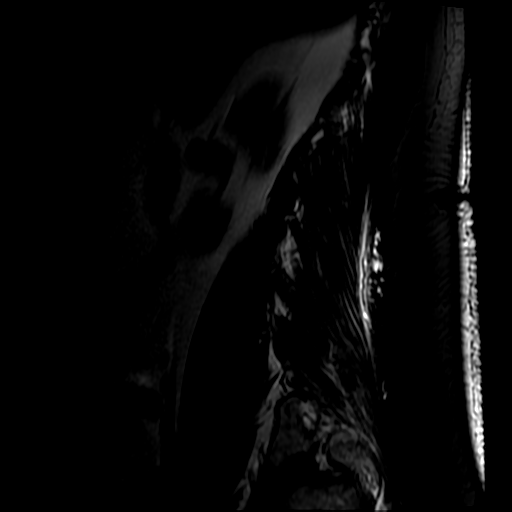
[im 4/17]
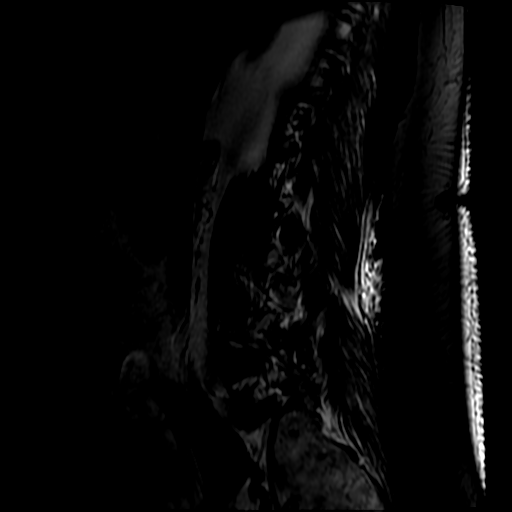
[im 7/17]
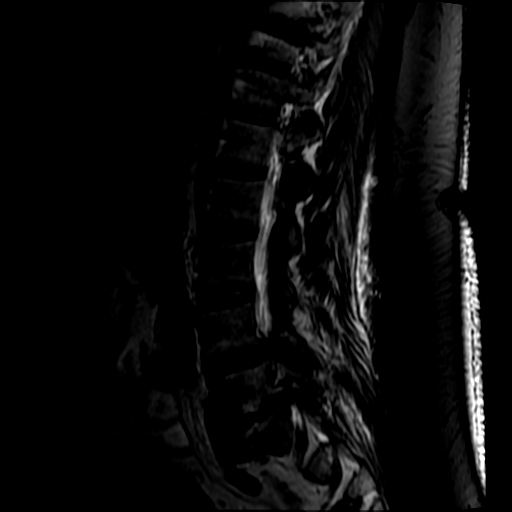
[im 10/17]
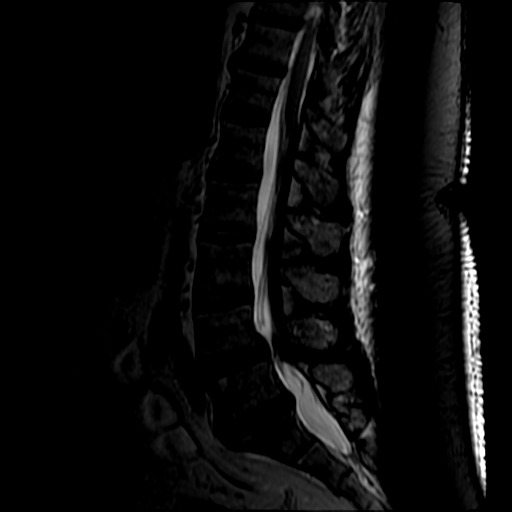
[im 13/17]
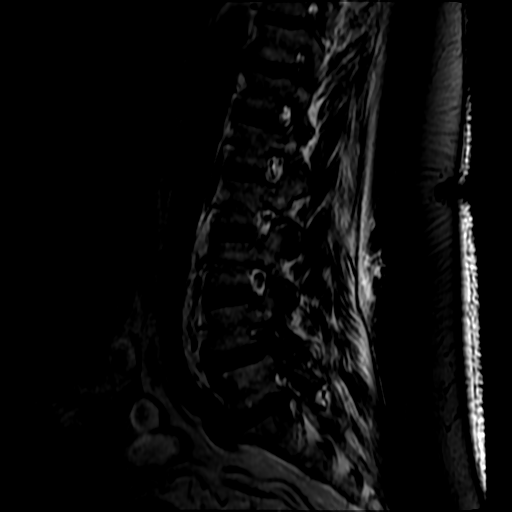
[im 17/17]
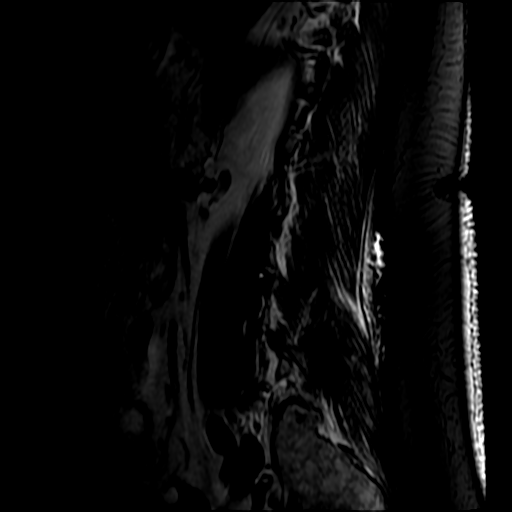

[Series 5: T1 · sagittal · 4.5mm · 0.55mm/px · 4 of 17 slices shown (1 of 2)]
[im 1/17]
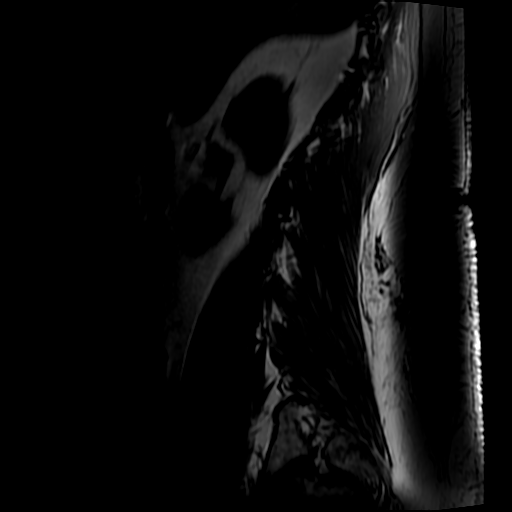
[im 3/17]
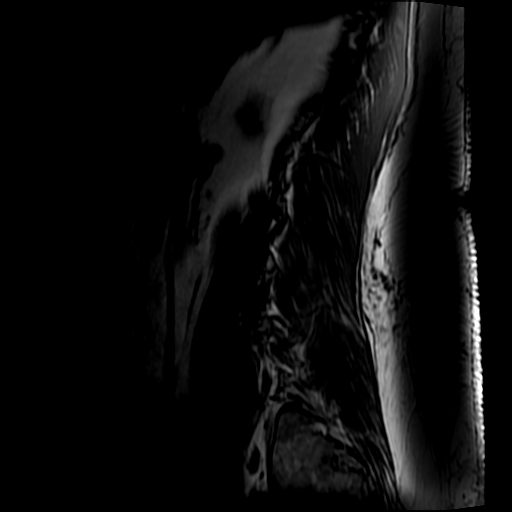
[im 9/17]
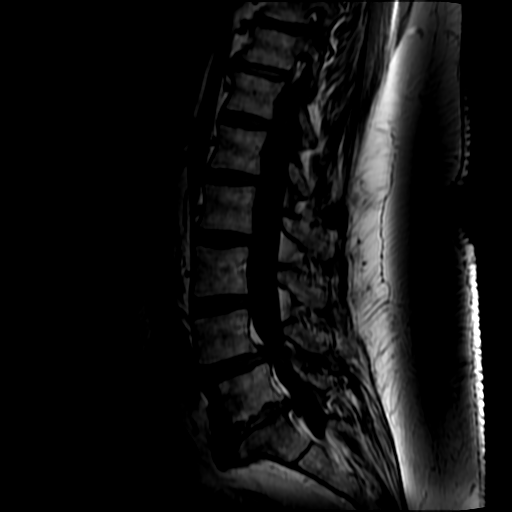
[im 14/17]
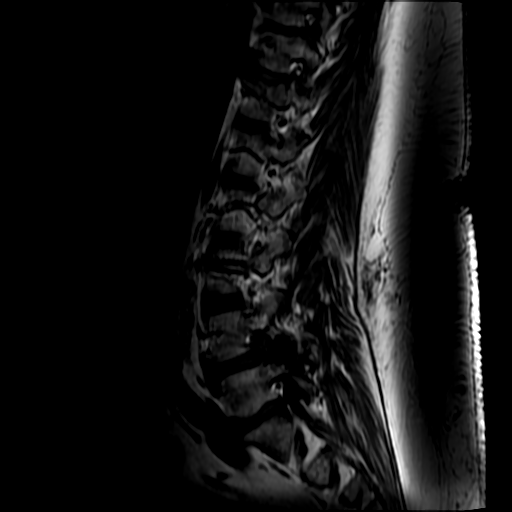

[Series 6: T2 · axial · 4.0mm · 0.43mm/px · z∈[+49,+244]mm · 8 of 35 slices shown (2 of 2)]
[im 1/35]
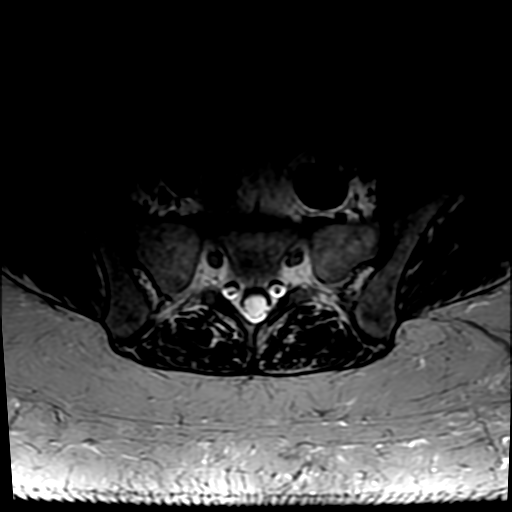
[im 6/35]
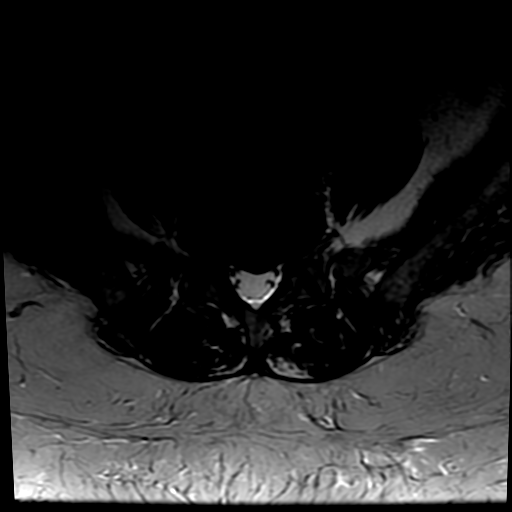
[im 11/35]
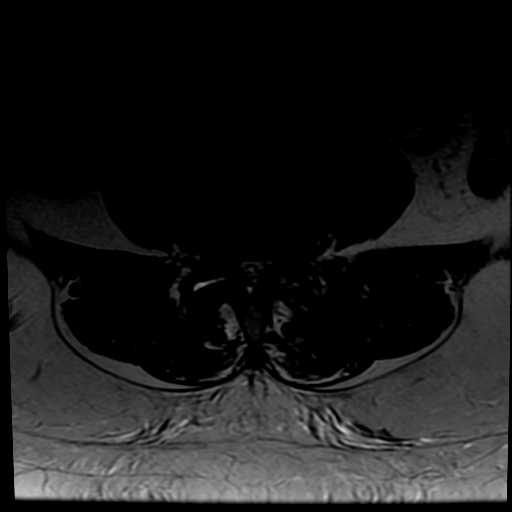
[im 16/35]
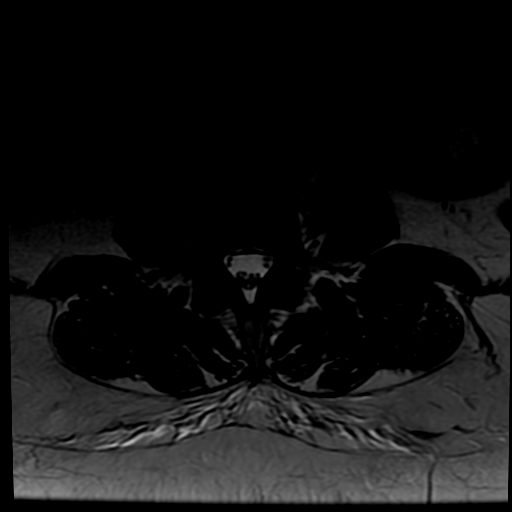
[im 19/35]
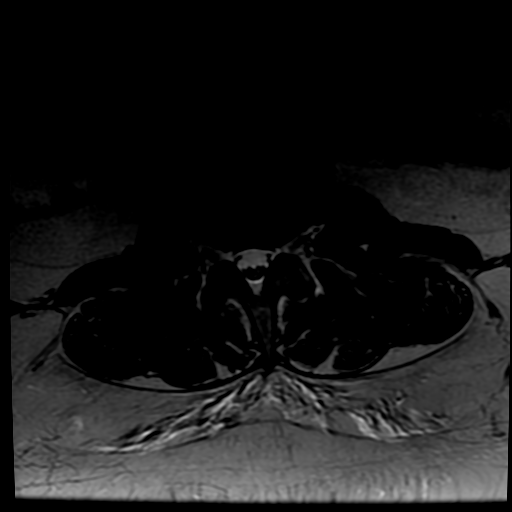
[im 24/35]
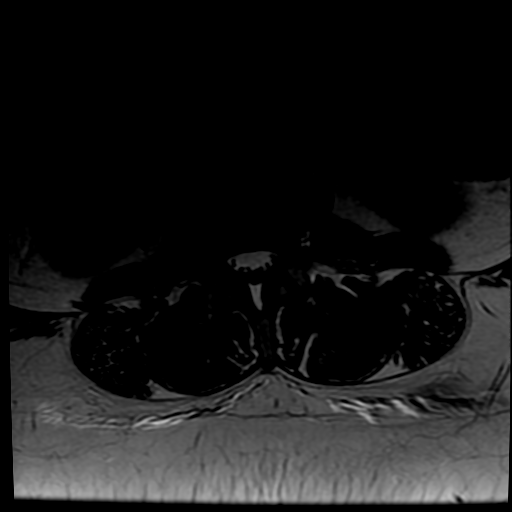
[im 29/35]
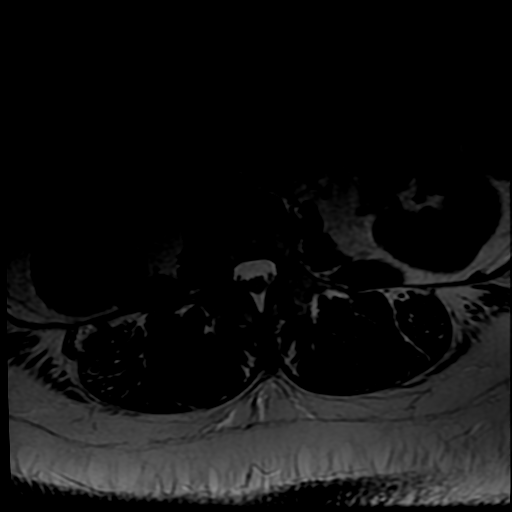
[im 35/35]
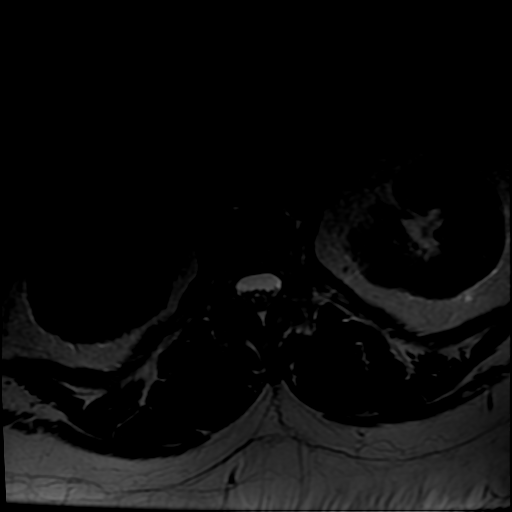

[Series 7: T1 · axial · 4.0mm · 0.86mm/px · z∈[+73,+214]mm · 3 of 35 slices shown (2 of 2)]
[im 6/35]
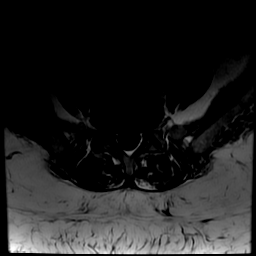
[im 19/35]
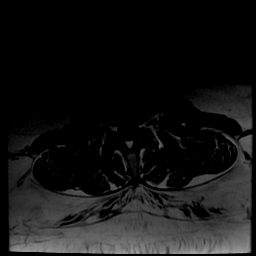
[im 29/35]
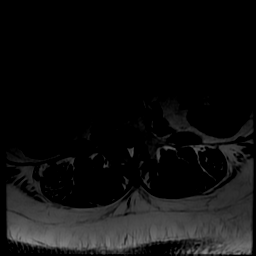

[21 of 48 positions shown; findings below may reference images not displayed]

FINDINGS: Segmentation:  Standard.

Alignment: Mild dextrocurvature with apex at L3. Grade 1 L4-5
anterolisthesis.

Vertebrae: Mild edema surrounding the L4-5 facets and trace facet
effusions, likely degenerative. Edema within the bilateral L5 and
right L4 pedicles without appreciable fracture, probable stress
reaction. No evidence for discitis. No loss of vertebral body
height.

Conus medullaris and cauda equina: Conus extends to the L1 level.
Conus and cauda equina appear normal.

Paraspinal and other soft tissues: Negative.

Disc levels:

L1-2: No significant disc displacement, foraminal stenosis, or canal
stenosis.

L2-3: No significant disc displacement, foraminal stenosis, or canal
stenosis.

L3-4: Minimal disc bulge and mild facet hypertrophy. No significant
foraminal or canal stenosis.

L4-5: 1 anterolisthesis with small uncovered disc bulge and severe
facet hypertrophy. Moderate to severe bilateral foraminal stenosis.
Moderate to severe canal stenosis. Lateral recess effacement.

L5-S1: Small disc bulge, endplate marginal osteophytes, small
central disc protrusion, and mild facet hypertrophy. Severe right
foraminal and mild left foraminal stenosis. No significant canal
stenosis.
IMPRESSION: 1. Mild lumbar spine dextrocurvature and grade 1 L4-5
anterolisthesis.
2. Mild L4-5 facet edema, likely degenerative.
3. Edema within bilateral L5 and right L4 pedicles without
appreciable fracture, probably stress reaction.
4. Lumbar spondylosis greatest at L4-5 and L5-S1 levels.
5. Moderate to severe bilateral L4-5 and severe right mild left
L5-S1 foraminal stenosis.
6. Moderate to severe L4-5 canal stenosis.

By: Angel Hernandez Yoongi M.D.

## 2019-04-28 IMAGING — DX DG LUMBAR SPINE COMPLETE 4+V
5 series · 5 of 5 positions shown · non-contrast
Comparison: None.

CLINICAL DATA: Bilateral leg pain for 1 week.

EXAM:
LUMBAR SPINE - COMPLETE 4+ VIEW

[l-spine ap]
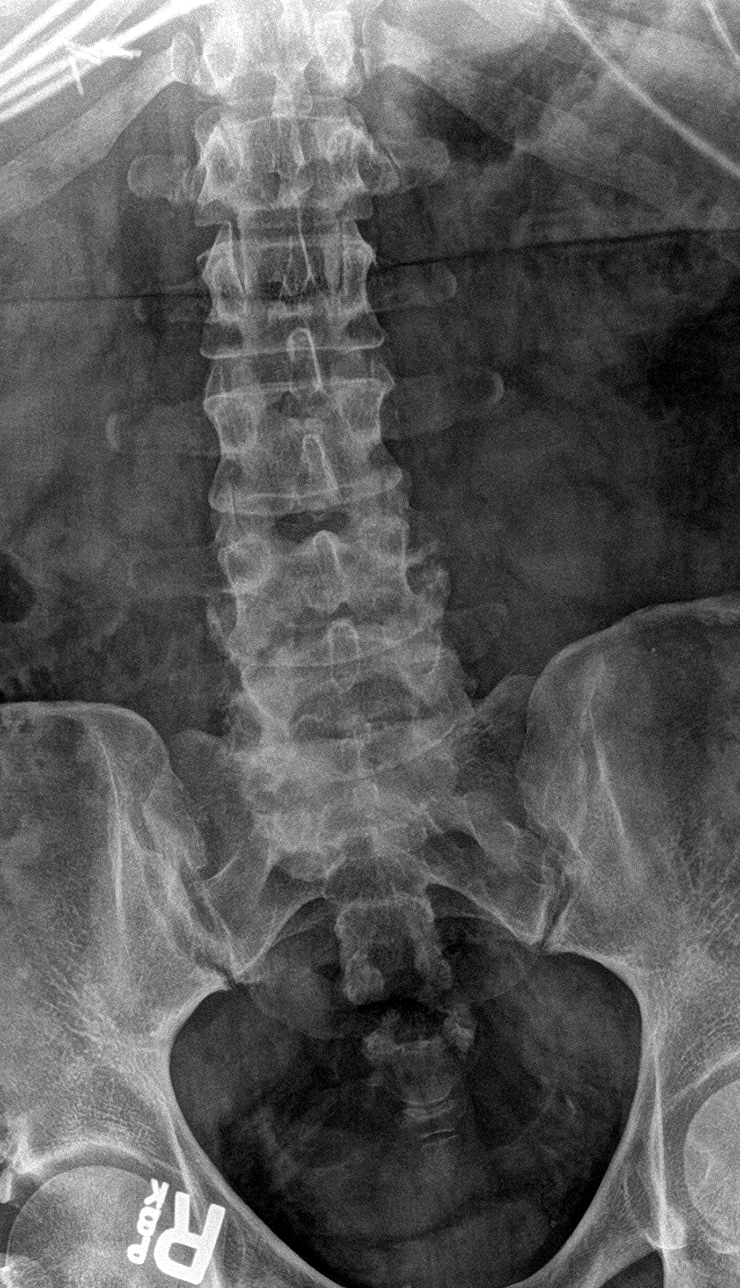

[l-spine obl (1 of 2)]
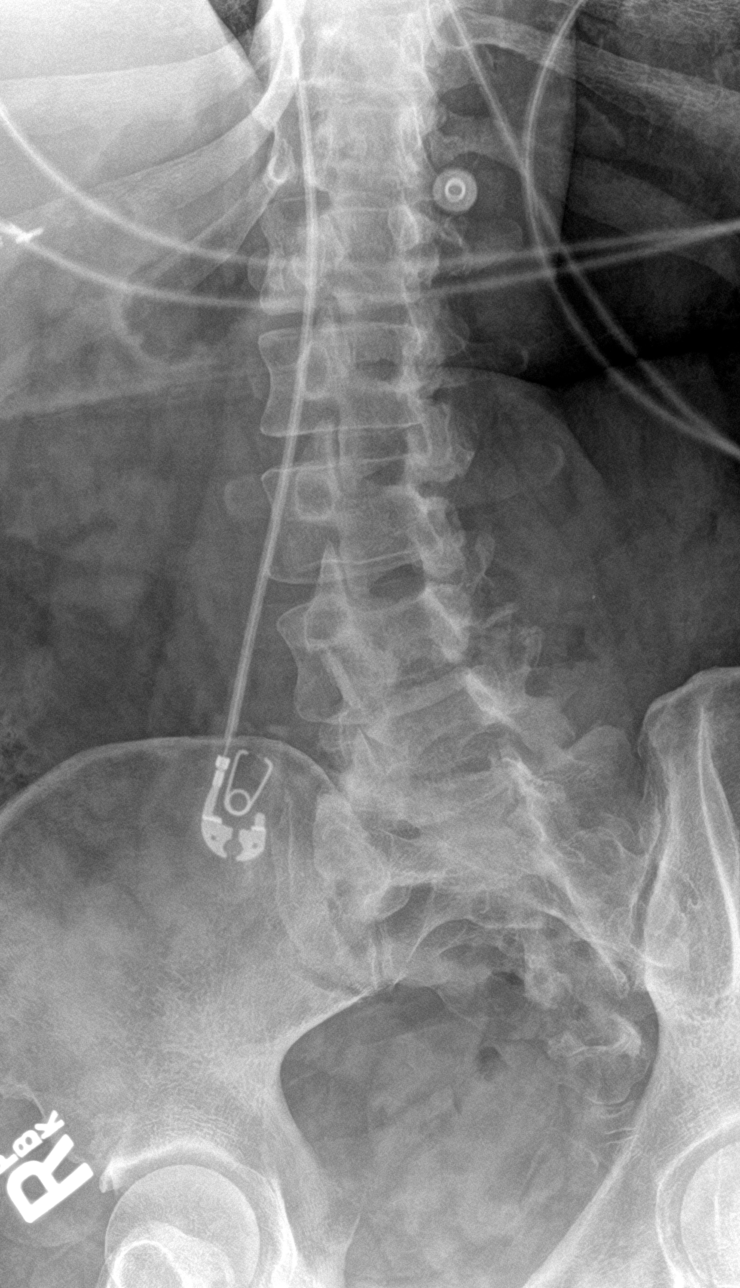

[l-spine obl (2 of 2)]
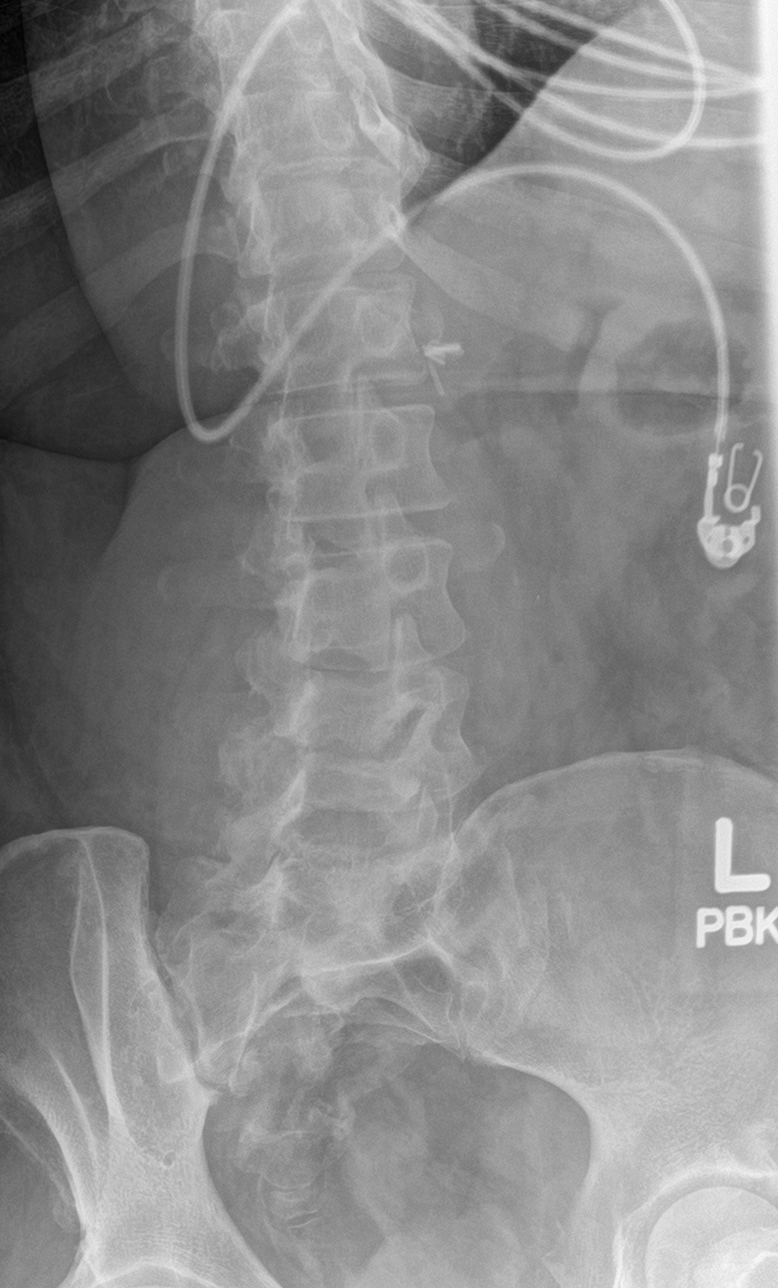

[l-spine lat]
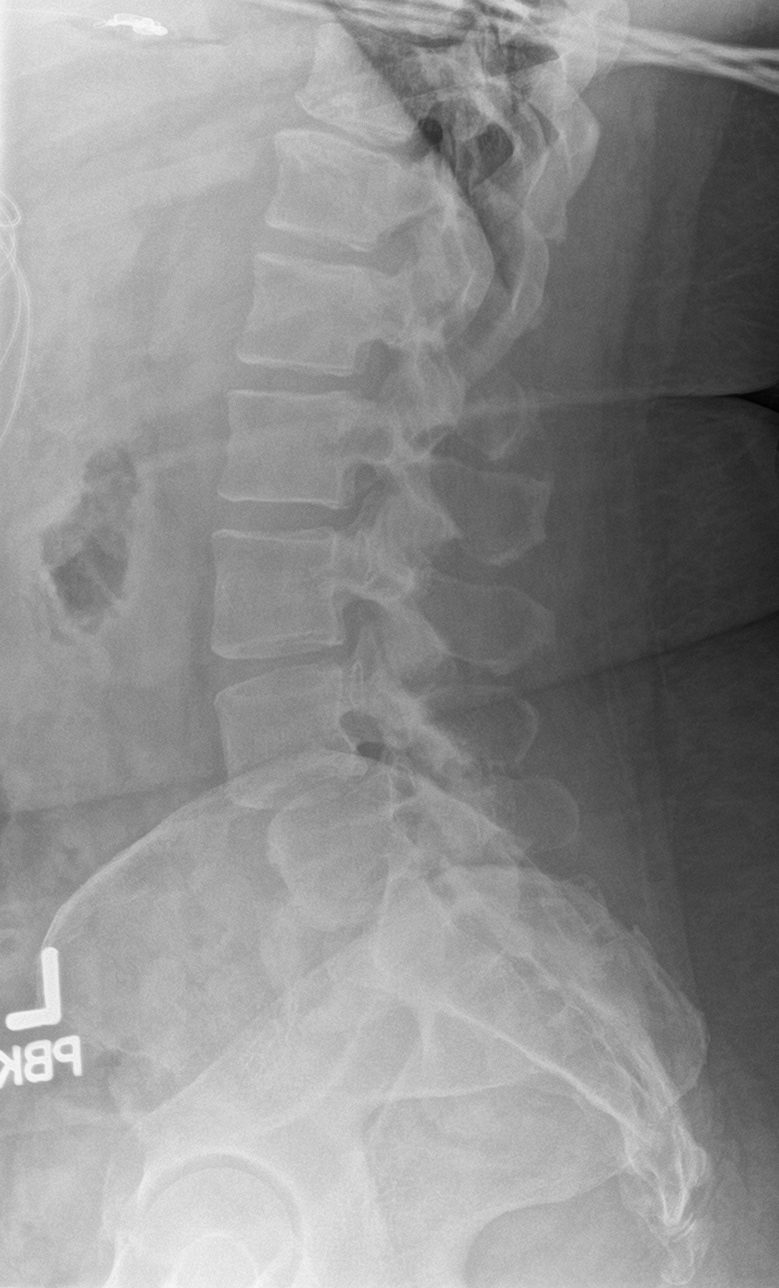

[l-spine spot]
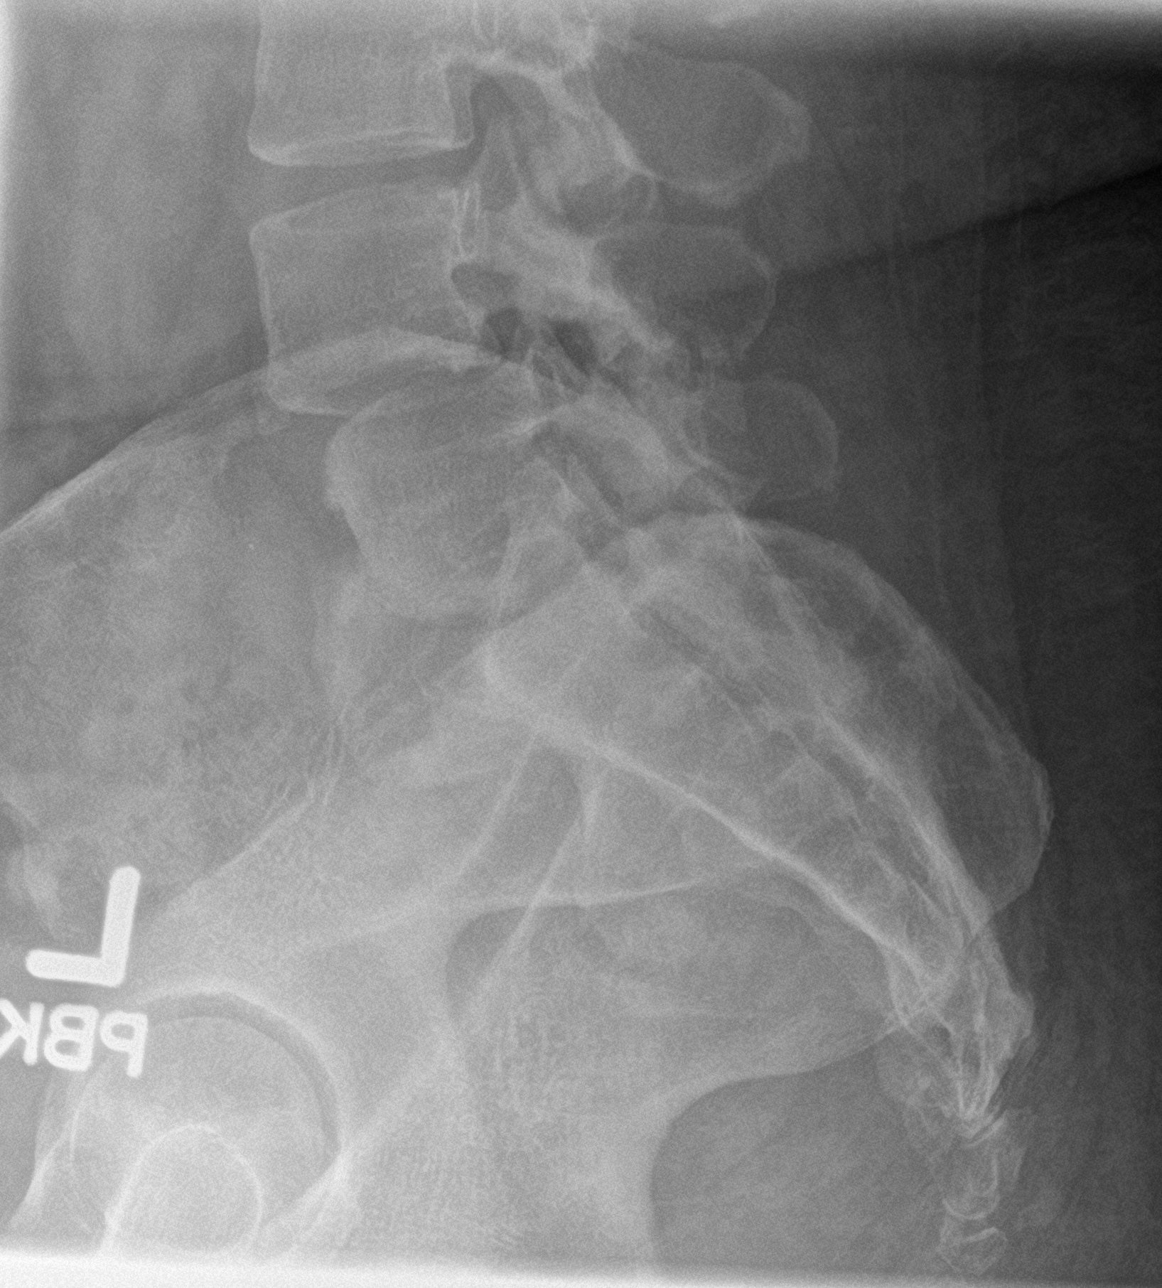

[5 of 5 positions shown; findings below may reference images not displayed]

FINDINGS: Grade 1 anterolisthesis of L4 versus L5. No other malalignment. No
fractures identified. Mild lower lumbar facet degenerative changes.
No other acute abnormalities.
IMPRESSION: Grade 1 anterolisthesis of L4 versus L5. Mild lower lumbar facet
degenerative changes.

## 2019-04-28 IMAGING — CT CT HEAD W/O CM
3 series · 15 of 47 positions shown, 18 images · non-contrast
Comparison: None.

CLINICAL DATA: Lower extremity weakness.

EXAM:
CT HEAD WITHOUT CONTRAST
TECHNIQUE: Contiguous axial images were obtained from the base of the skull
through the vertex without intravenous contrast.

[Series 2: head wo · axial · 0.43mm/px · z∈[-134,-9]mm · 9 of 30 slices shown, 12 images]
[im 3/30  brain]
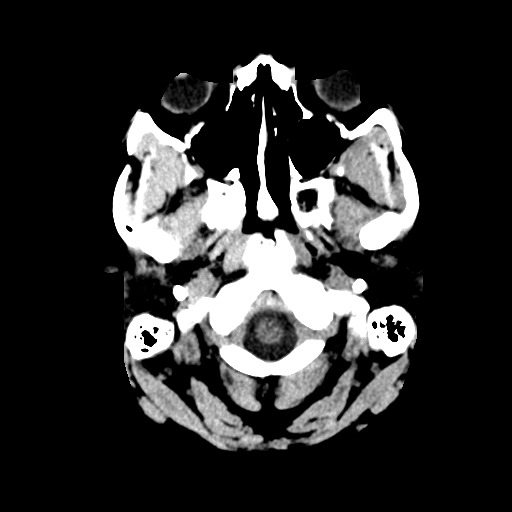
[im 3/30  bone]
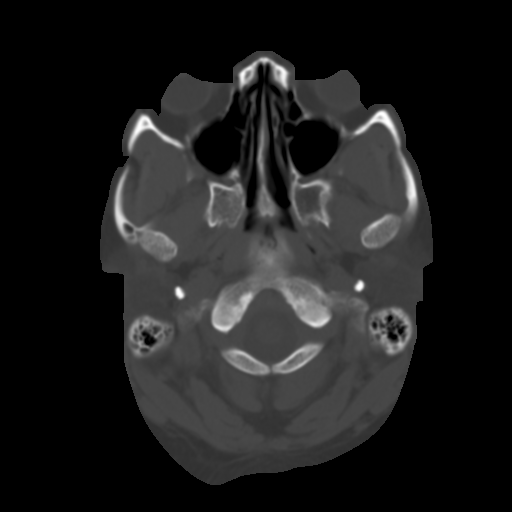
[im 6/30  brain]
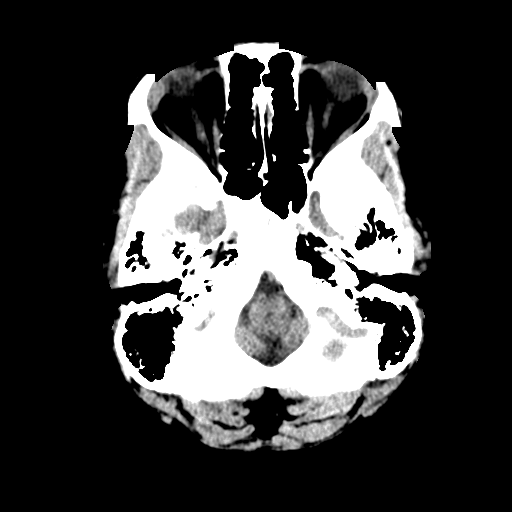
[im 9/30  brain]
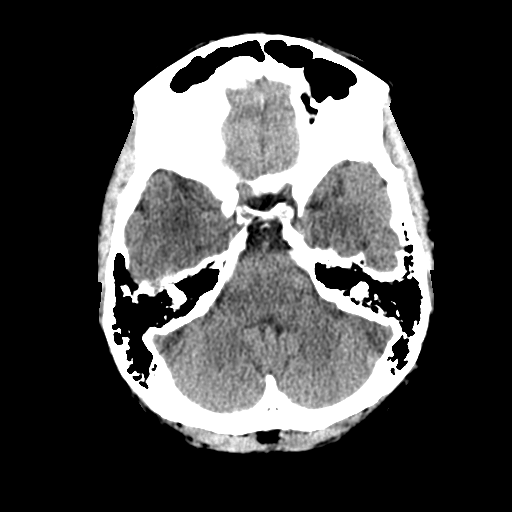
[im 12/30  brain]
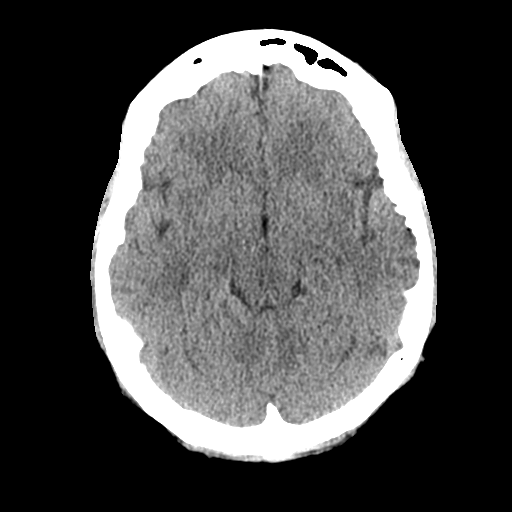
[im 16/30  brain]
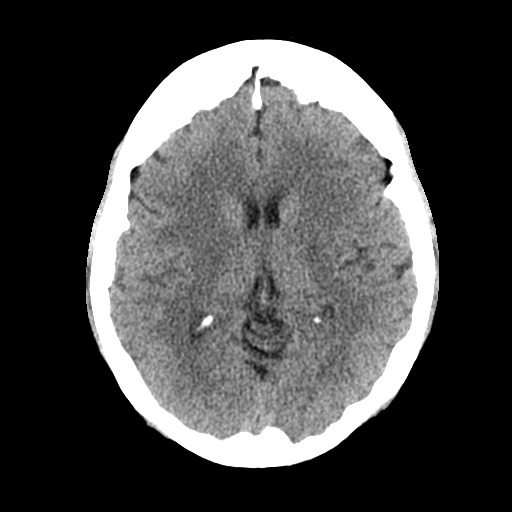
[im 16/30  bone]
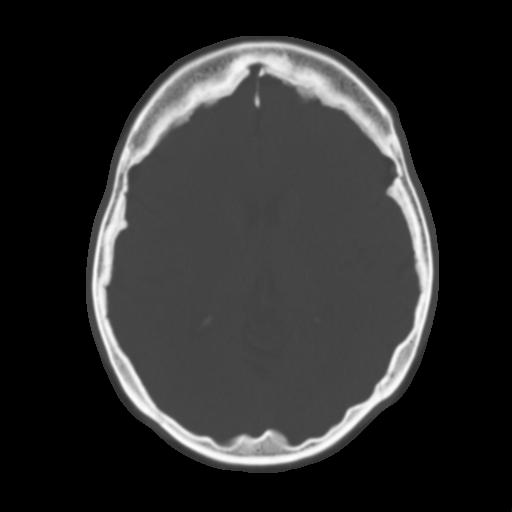
[im 19/30  brain]
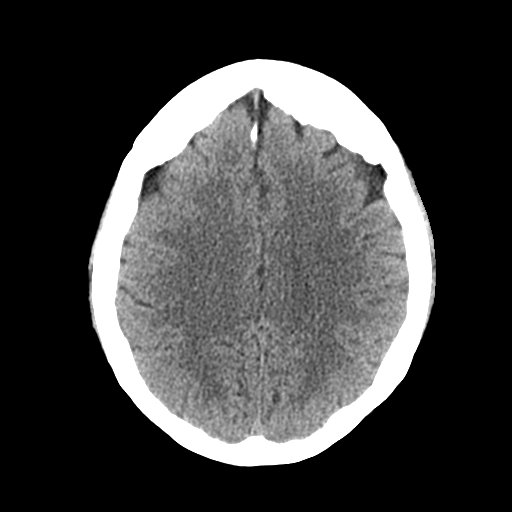
[im 22/30  brain]
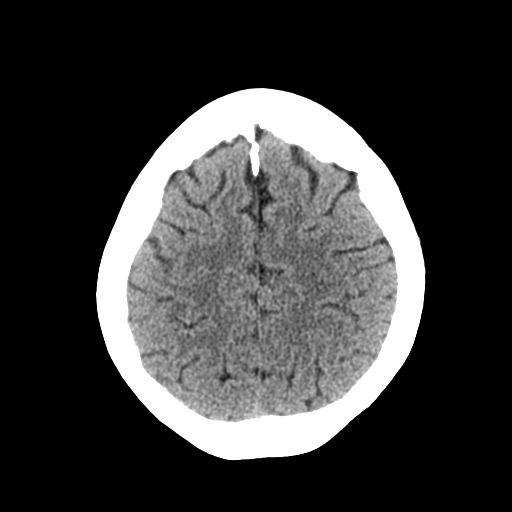
[im 25/30  brain]
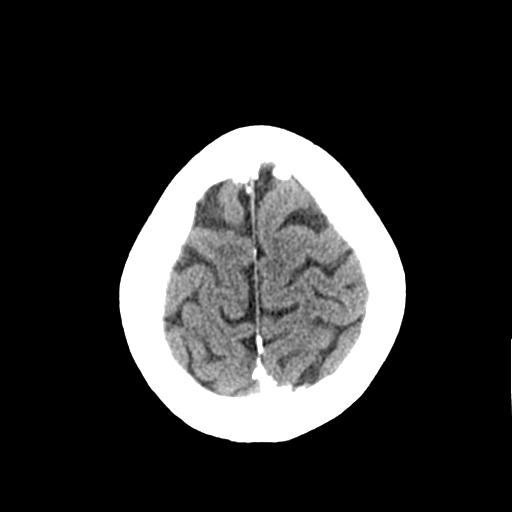
[im 28/30  brain]
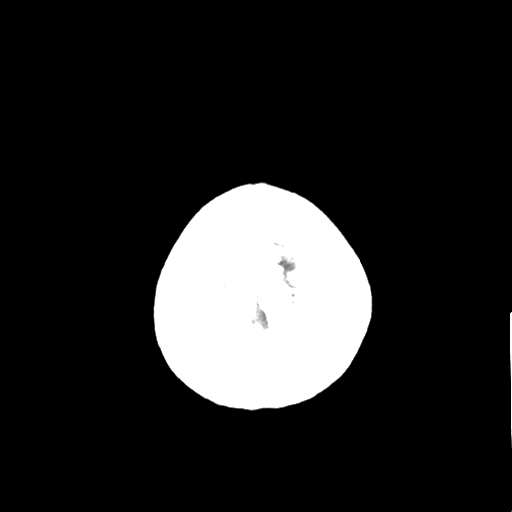
[im 28/30  bone]
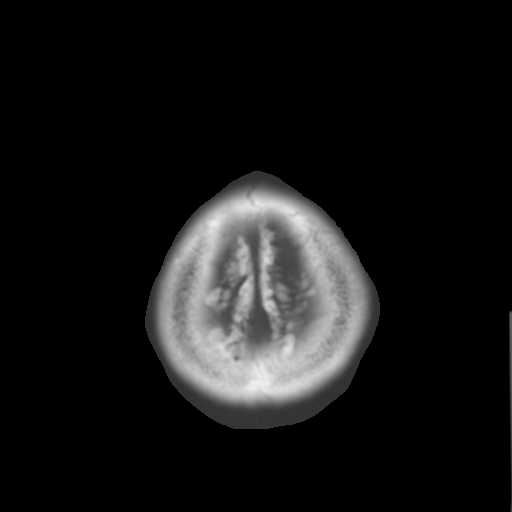

[Series 5: coronal soft tissue · coronal · 0.30mm/px · 3 of 68 slices shown]
[im 23/68  brain]
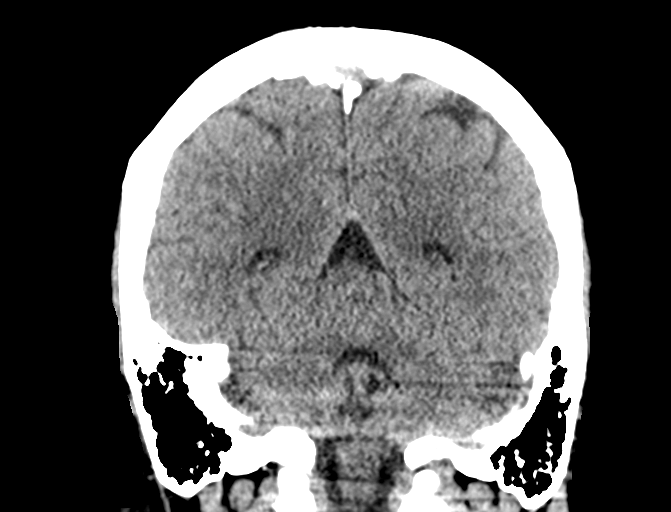
[im 30/68  brain]
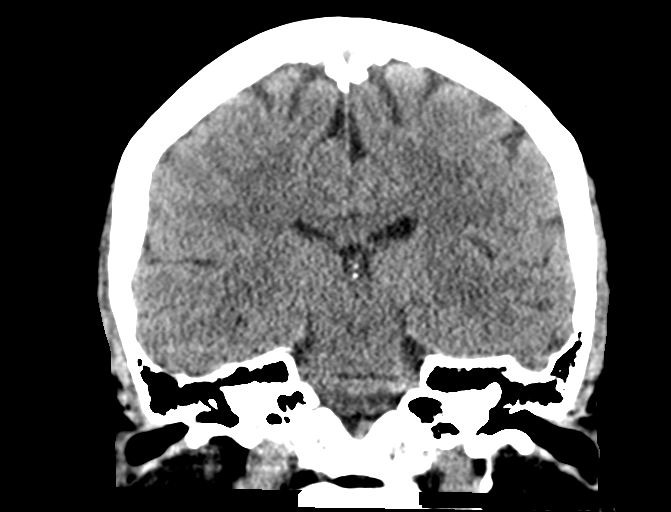
[im 38/68  brain]
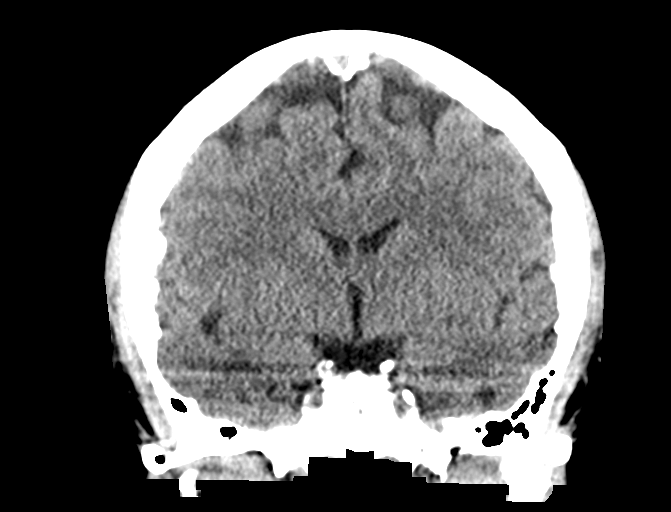

[Series 6: sagittal soft tissue · sagittal · 0.32mm/px · 3 of 61 slices shown]
[im 21/61  brain]
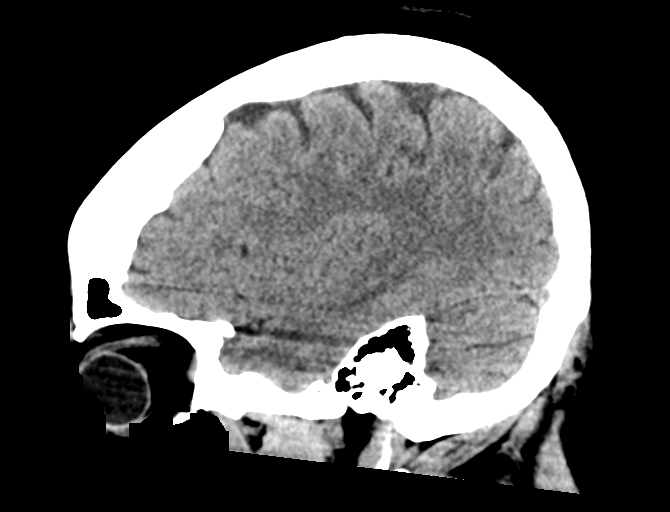
[im 31/61  brain]
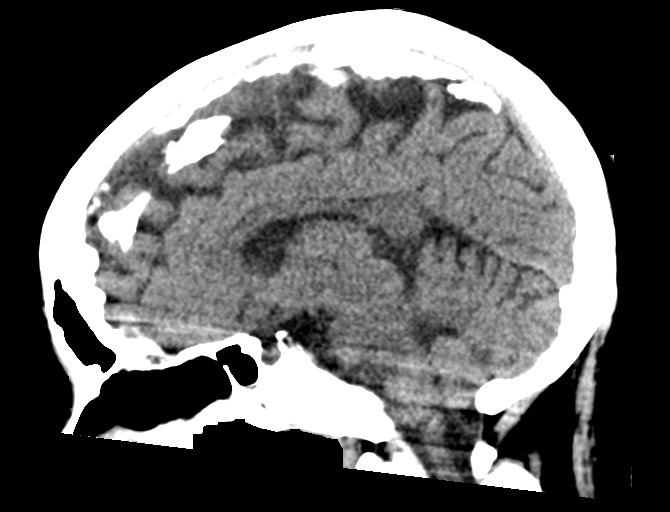
[im 41/61  brain]
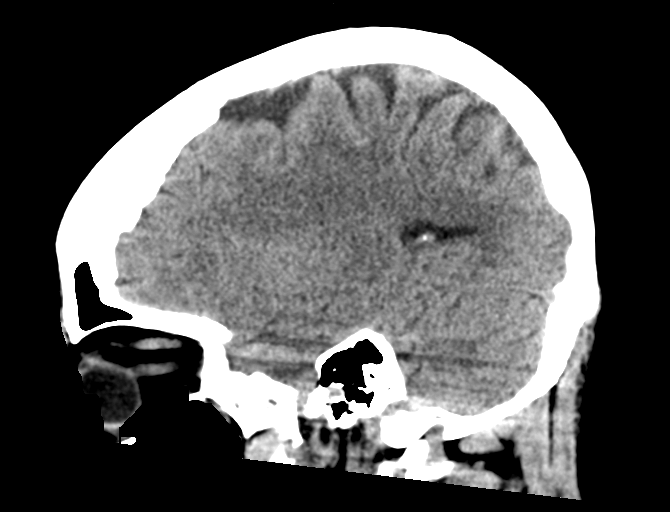

[15 of 47 positions shown; findings below may reference images not displayed]

FINDINGS: Brain: No acute intracranial abnormality. Specifically, no
hemorrhage, hydrocephalus, mass lesion, acute infarction, or
significant intracranial injury.

Vascular: No hyperdense vessel or unexpected calcification.

Skull: No acute calvarial abnormality.

Sinuses/Orbits: Visualized paranasal sinuses and mastoids clear.
Orbital soft tissues unremarkable.

Other: None
IMPRESSION: No intracranial abnormality.

## 2019-04-28 IMAGING — DX DG CHEST 2V
2 series · 2 of 2 positions shown · non-contrast
Comparison: August 22, 2016

CLINICAL DATA: Bilateral leg pain for 1 week.

EXAM:
CHEST  2 VIEW

[chest lat]
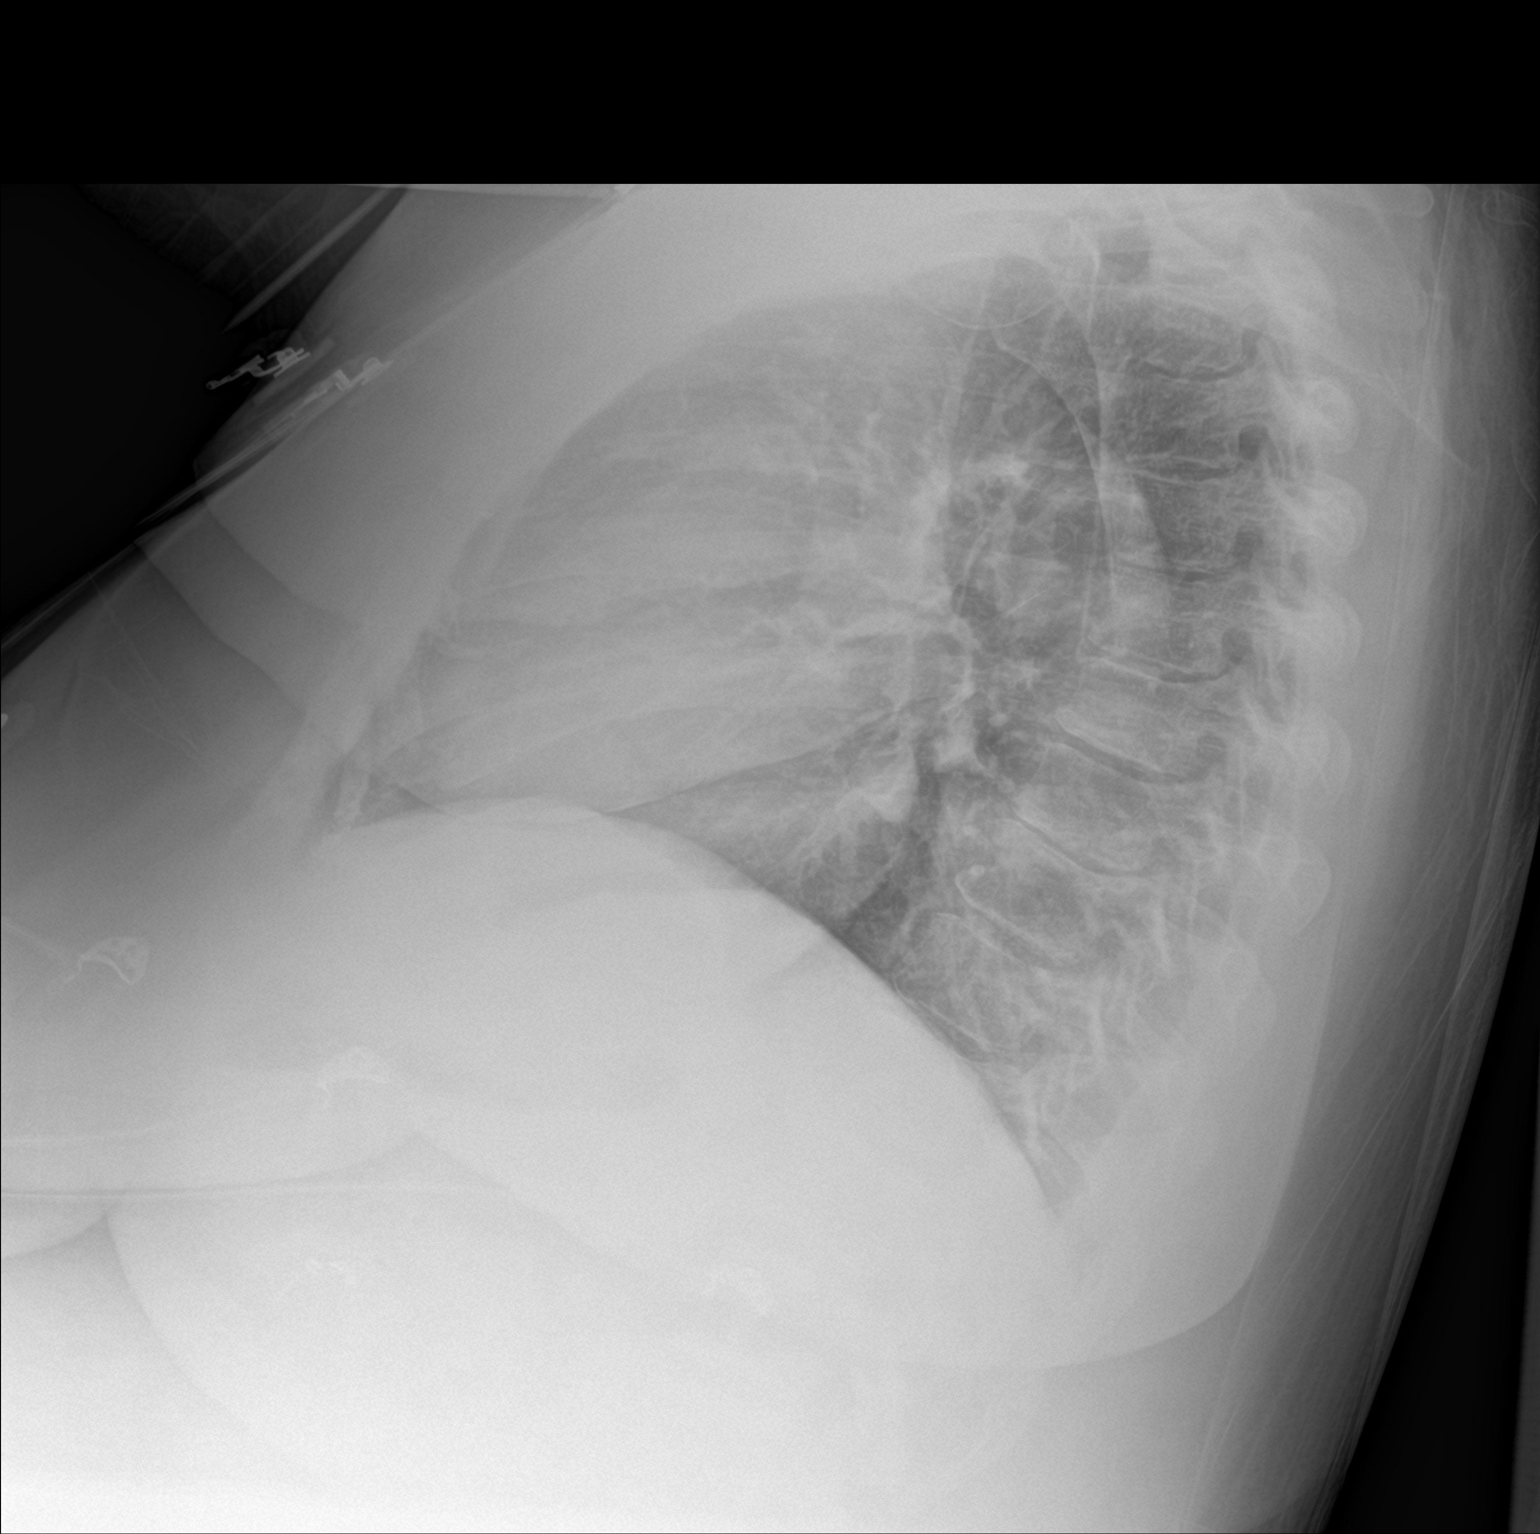

[chest ap]
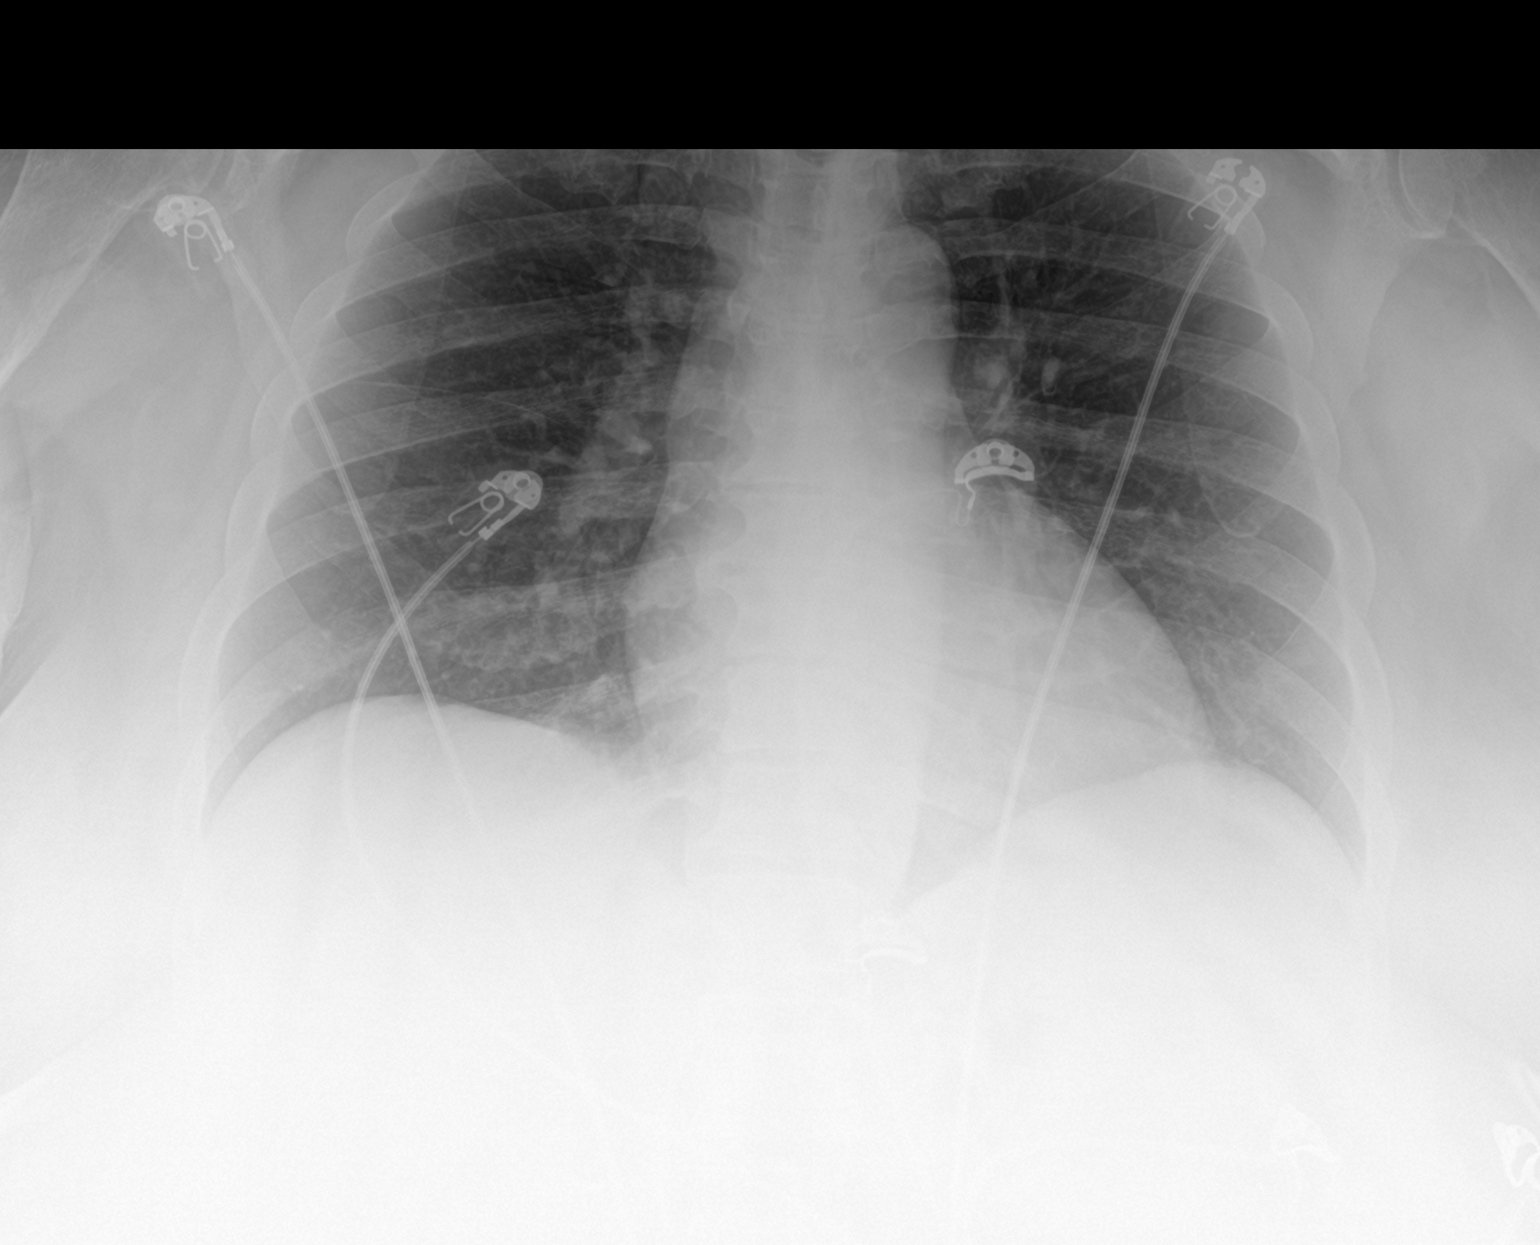

[2 of 2 positions shown; findings below may reference images not displayed]

FINDINGS: The heart size and mediastinal contours are within normal limits.
Both lungs are clear. The visualized skeletal structures are
unremarkable.
IMPRESSION: No active cardiopulmonary disease.

## 2019-05-07 ENCOUNTER — Ambulatory Visit: Payer: Medicare Other | Admitting: Student in an Organized Health Care Education/Training Program

## 2019-05-07 ENCOUNTER — Encounter (HOSPITAL_COMMUNITY): Payer: Self-pay | Admitting: Emergency Medicine

## 2019-05-07 ENCOUNTER — Inpatient Hospital Stay (HOSPITAL_COMMUNITY)
Admission: EM | Admit: 2019-05-07 | Discharge: 2019-05-09 | DRG: 291 | Disposition: A | Discharge status: Home Health | Payer: Medicare Other | Attending: Family Medicine | Admitting: Family Medicine

## 2019-05-07 ENCOUNTER — Emergency Department (HOSPITAL_COMMUNITY): Payer: Medicare Other

## 2019-05-07 DIAGNOSIS — Z7982 Long term (current) use of aspirin: Secondary | ICD-10-CM

## 2019-05-07 DIAGNOSIS — E1122 Type 2 diabetes mellitus with diabetic chronic kidney disease: Secondary | ICD-10-CM | POA: Diagnosis present

## 2019-05-07 DIAGNOSIS — N182 Chronic kidney disease, stage 2 (mild): Secondary | ICD-10-CM | POA: Diagnosis present

## 2019-05-07 DIAGNOSIS — Z20822 Contact with and (suspected) exposure to covid-19: Secondary | ICD-10-CM | POA: Diagnosis present

## 2019-05-07 DIAGNOSIS — I130 Hypertensive heart and chronic kidney disease with heart failure and stage 1 through stage 4 chronic kidney disease, or unspecified chronic kidney disease: Principal | ICD-10-CM | POA: Diagnosis present

## 2019-05-07 DIAGNOSIS — E785 Hyperlipidemia, unspecified: Secondary | ICD-10-CM | POA: Diagnosis present

## 2019-05-07 DIAGNOSIS — Z794 Long term (current) use of insulin: Secondary | ICD-10-CM

## 2019-05-07 DIAGNOSIS — K529 Noninfective gastroenteritis and colitis, unspecified: Secondary | ICD-10-CM

## 2019-05-07 DIAGNOSIS — Z6841 Body Mass Index (BMI) 40.0 and over, adult: Secondary | ICD-10-CM

## 2019-05-07 DIAGNOSIS — Z8249 Family history of ischemic heart disease and other diseases of the circulatory system: Secondary | ICD-10-CM

## 2019-05-07 DIAGNOSIS — J302 Other seasonal allergic rhinitis: Secondary | ICD-10-CM | POA: Diagnosis present

## 2019-05-07 DIAGNOSIS — Z833 Family history of diabetes mellitus: Secondary | ICD-10-CM

## 2019-05-07 DIAGNOSIS — R5381 Other malaise: Secondary | ICD-10-CM

## 2019-05-07 DIAGNOSIS — Z8673 Personal history of transient ischemic attack (TIA), and cerebral infarction without residual deficits: Secondary | ICD-10-CM

## 2019-05-07 DIAGNOSIS — E1165 Type 2 diabetes mellitus with hyperglycemia: Secondary | ICD-10-CM | POA: Diagnosis present

## 2019-05-07 DIAGNOSIS — R0602 Shortness of breath: Secondary | ICD-10-CM | POA: Diagnosis not present

## 2019-05-07 DIAGNOSIS — E669 Obesity, unspecified: Secondary | ICD-10-CM

## 2019-05-07 DIAGNOSIS — G8929 Other chronic pain: Secondary | ICD-10-CM | POA: Diagnosis present

## 2019-05-07 DIAGNOSIS — J441 Chronic obstructive pulmonary disease with (acute) exacerbation: Secondary | ICD-10-CM | POA: Diagnosis present

## 2019-05-07 DIAGNOSIS — G4700 Insomnia, unspecified: Secondary | ICD-10-CM | POA: Diagnosis present

## 2019-05-07 DIAGNOSIS — Z609 Problem related to social environment, unspecified: Secondary | ICD-10-CM

## 2019-05-07 DIAGNOSIS — I5023 Acute on chronic systolic (congestive) heart failure: Secondary | ICD-10-CM

## 2019-05-07 DIAGNOSIS — K3184 Gastroparesis: Secondary | ICD-10-CM | POA: Diagnosis present

## 2019-05-07 DIAGNOSIS — J984 Other disorders of lung: Secondary | ICD-10-CM

## 2019-05-07 DIAGNOSIS — N183 Chronic kidney disease, stage 3 unspecified: Secondary | ICD-10-CM | POA: Diagnosis present

## 2019-05-07 DIAGNOSIS — Z7901 Long term (current) use of anticoagulants: Secondary | ICD-10-CM

## 2019-05-07 DIAGNOSIS — I480 Paroxysmal atrial fibrillation: Secondary | ICD-10-CM | POA: Diagnosis present

## 2019-05-07 DIAGNOSIS — M109 Gout, unspecified: Secondary | ICD-10-CM | POA: Diagnosis present

## 2019-05-07 DIAGNOSIS — F419 Anxiety disorder, unspecified: Secondary | ICD-10-CM | POA: Diagnosis present

## 2019-05-07 DIAGNOSIS — E1143 Type 2 diabetes mellitus with diabetic autonomic (poly)neuropathy: Secondary | ICD-10-CM | POA: Diagnosis present

## 2019-05-07 DIAGNOSIS — K219 Gastro-esophageal reflux disease without esophagitis: Secondary | ICD-10-CM | POA: Diagnosis present

## 2019-05-07 DIAGNOSIS — Z79899 Other long term (current) drug therapy: Secondary | ICD-10-CM

## 2019-05-07 DIAGNOSIS — E876 Hypokalemia: Secondary | ICD-10-CM | POA: Diagnosis present

## 2019-05-07 LAB — URINALYSIS, ROUTINE W REFLEX MICROSCOPIC
Bacteria, UA: NONE SEEN
Bilirubin Urine: NEGATIVE
Glucose, UA: 50 mg/dL — AB
Ketones, ur: NEGATIVE mg/dL
Leukocytes,Ua: NEGATIVE
Nitrite: NEGATIVE
Protein, ur: >=300 mg/dL — AB
Specific Gravity, Urine: 1.018 (ref 1.005–1.030)
pH: 5 (ref 5.0–8.0)

## 2019-05-07 LAB — CBC
HCT: 29.3 % — ABNORMAL LOW (ref 36.0–46.0)
Hemoglobin: 8.6 g/dL — ABNORMAL LOW (ref 12.0–15.0)
MCH: 27.6 pg (ref 26.0–34.0)
MCHC: 29.4 g/dL — ABNORMAL LOW (ref 30.0–36.0)
MCV: 93.9 fL (ref 80.0–100.0)
Platelets: 375 10*3/uL (ref 150–400)
RBC: 3.12 MIL/uL — ABNORMAL LOW (ref 3.87–5.11)
RDW: 15 % (ref 11.5–15.5)
WBC: 9.7 10*3/uL (ref 4.0–10.5)
nRBC: 0 % (ref 0.0–0.2)

## 2019-05-07 LAB — BASIC METABOLIC PANEL
Anion gap: 10 (ref 5–15)
BUN: 23 mg/dL — ABNORMAL HIGH (ref 6–20)
CO2: 22 mmol/L (ref 22–32)
Calcium: 8.9 mg/dL (ref 8.9–10.3)
Chloride: 108 mmol/L (ref 98–111)
Creatinine, Ser: 1.41 mg/dL — ABNORMAL HIGH (ref 0.44–1.00)
GFR calc Af Amer: 48 mL/min — ABNORMAL LOW (ref >60)
GFR calc non Af Amer: 42 mL/min — ABNORMAL LOW (ref >60)
Glucose, Bld: 154 mg/dL — ABNORMAL HIGH (ref 70–99)
Potassium: 3.2 mmol/L — ABNORMAL LOW (ref 3.5–5.1)
Sodium: 140 mmol/L (ref 135–145)

## 2019-05-07 LAB — I-STAT BETA HCG BLOOD, ED (MC, WL, AP ONLY): I-stat hCG, quantitative: <5 m[IU]/mL (ref <5)

## 2019-05-07 LAB — POC SARS CORONAVIRUS 2 AG -  ED: SARS Coronavirus 2 Ag: NEGATIVE

## 2019-05-07 LAB — BRAIN NATRIURETIC PEPTIDE: B Natriuretic Peptide: 127.1 pg/mL — ABNORMAL HIGH (ref 0.0–100.0)

## 2019-05-07 LAB — TROPONIN I (HIGH SENSITIVITY)
Troponin I (High Sensitivity): 40 ng/L — ABNORMAL HIGH (ref <18)
Troponin I (High Sensitivity): 36 ng/L — ABNORMAL HIGH (ref <18)

## 2019-05-07 MED ORDER — ENOXAPARIN SODIUM 40 MG/0.4ML ~~LOC~~ SOLN
40.0000 mg | SUBCUTANEOUS | Status: DC
  Administered 2019-05-08: 40 mg via SUBCUTANEOUS
  Filled 2019-05-07: qty 0.4

## 2019-05-07 MED ORDER — ONDANSETRON HCL 4 MG PO TABS: 4.0000 mg | ORAL_TABLET | Freq: Four times a day (QID) | ORAL | Status: DC | PRN

## 2019-05-07 MED ORDER — POLYETHYLENE GLYCOL 3350 17 G PO PACK: 17.0000 g | PACK | Freq: Every day | ORAL | Status: DC | PRN

## 2019-05-07 MED ORDER — FUROSEMIDE 10 MG/ML IJ SOLN: 40.0000 mg | Freq: Once | INTRAMUSCULAR | Status: DC

## 2019-05-07 MED ORDER — METOPROLOL SUCCINATE ER 50 MG PO TB24
50.0000 mg | ORAL_TABLET | Freq: Every day | ORAL | Status: DC
  Administered 2019-05-08 – 2019-05-09 (×2): 50 mg via ORAL
  Filled 2019-05-07 (×3): qty 1

## 2019-05-07 MED ORDER — ACETAMINOPHEN 325 MG PO TABS
650.0000 mg | ORAL_TABLET | Freq: Four times a day (QID) | ORAL | Status: DC | PRN
  Filled 2019-05-07: qty 2

## 2019-05-07 MED ORDER — POTASSIUM CHLORIDE 10 MEQ/100ML IV SOLN
10.0000 meq | Freq: Once | INTRAVENOUS | Status: DC
  Filled 2019-05-07: qty 100

## 2019-05-07 MED ORDER — INSULIN ASPART 100 UNIT/ML ~~LOC~~ SOLN
0.0000 [IU] | Freq: Three times a day (TID) | SUBCUTANEOUS | Status: DC
  Administered 2019-05-08: 11 [IU] via SUBCUTANEOUS
  Administered 2019-05-08: 8 [IU] via SUBCUTANEOUS
  Administered 2019-05-08: 5 [IU] via SUBCUTANEOUS
  Administered 2019-05-09 (×2): 8 [IU] via SUBCUTANEOUS

## 2019-05-07 MED ORDER — FLUTICASONE PROPIONATE 50 MCG/ACT NA SUSP
2.0000 | Freq: Every day | NASAL | Status: DC | PRN
  Filled 2019-05-07: qty 16

## 2019-05-07 MED ORDER — SODIUM CHLORIDE 0.9% FLUSH
3.0000 mL | Freq: Once | INTRAVENOUS | Status: AC
  Administered 2019-05-08: 3 mL via INTRAVENOUS

## 2019-05-07 MED ORDER — ONDANSETRON HCL 4 MG/2ML IJ SOLN
4.0000 mg | Freq: Four times a day (QID) | INTRAMUSCULAR | Status: DC | PRN
  Administered 2019-05-08 – 2019-05-09 (×2): 4 mg via INTRAVENOUS
  Filled 2019-05-07 (×2): qty 2

## 2019-05-07 MED ORDER — AMLODIPINE BESYLATE 10 MG PO TABS
10.0000 mg | ORAL_TABLET | Freq: Every morning | ORAL | Status: DC
  Administered 2019-05-08: 10 mg via ORAL
  Filled 2019-05-07 (×2): qty 1

## 2019-05-07 MED ORDER — ALBUTEROL SULFATE (2.5 MG/3ML) 0.083% IN NEBU: 2.5000 mg | INHALATION_SOLUTION | Freq: Four times a day (QID) | RESPIRATORY_TRACT | Status: DC | PRN

## 2019-05-07 MED ORDER — MELATONIN 3 MG PO TABS
6.0000 mg | ORAL_TABLET | Freq: Every day | ORAL | Status: DC
  Administered 2019-05-08: 6 mg via ORAL
  Filled 2019-05-07 (×4): qty 2

## 2019-05-07 MED ORDER — FUROSEMIDE 10 MG/ML IJ SOLN
40.0000 mg | Freq: Once | INTRAMUSCULAR | Status: DC
  Filled 2019-05-07: qty 4

## 2019-05-07 MED ORDER — LOPERAMIDE HCL 2 MG PO CAPS
2.0000 mg | ORAL_CAPSULE | Freq: Four times a day (QID) | ORAL | Status: DC | PRN
  Administered 2019-05-08 – 2019-05-09 (×3): 2 mg via ORAL
  Filled 2019-05-07 (×3): qty 1

## 2019-05-07 MED ORDER — ALLOPURINOL 100 MG PO TABS: 50.0000 mg | ORAL_TABLET | Freq: Every day | ORAL | Status: DC

## 2019-05-07 MED ORDER — DICYCLOMINE HCL 20 MG PO TABS
20.0000 mg | ORAL_TABLET | Freq: Three times a day (TID) | ORAL | Status: DC | PRN
  Administered 2019-05-08 – 2019-05-09 (×4): 20 mg via ORAL
  Filled 2019-05-07 (×4): qty 1

## 2019-05-07 MED ORDER — HYDRALAZINE HCL 25 MG PO TABS
25.0000 mg | ORAL_TABLET | Freq: Three times a day (TID) | ORAL | Status: DC
  Administered 2019-05-08 – 2019-05-09 (×4): 25 mg via ORAL
  Filled 2019-05-07 (×5): qty 1

## 2019-05-07 MED ORDER — FUROSEMIDE 10 MG/ML IJ SOLN: 80.0000 mg | Freq: Once | INTRAMUSCULAR | Status: DC

## 2019-05-07 MED ORDER — POTASSIUM CHLORIDE CRYS ER 20 MEQ PO TBCR
20.0000 meq | EXTENDED_RELEASE_TABLET | Freq: Two times a day (BID) | ORAL | Status: AC
  Administered 2019-05-08: 20 meq via ORAL
  Filled 2019-05-07 (×2): qty 1

## 2019-05-07 MED ORDER — INSULIN GLARGINE 100 UNIT/ML ~~LOC~~ SOLN
40.0000 [IU] | Freq: Every day | SUBCUTANEOUS | Status: DC
  Administered 2019-05-08: 40 [IU] via SUBCUTANEOUS
  Filled 2019-05-07 (×2): qty 0.4

## 2019-05-07 MED ORDER — FUROSEMIDE 80 MG PO TABS
80.0000 mg | ORAL_TABLET | Freq: Once | ORAL | Status: DC
  Filled 2019-05-07: qty 4

## 2019-05-07 MED ORDER — POTASSIUM CHLORIDE CRYS ER 20 MEQ PO TBCR
40.0000 meq | EXTENDED_RELEASE_TABLET | Freq: Once | ORAL | Status: AC
  Administered 2019-05-07: 40 meq via ORAL
  Filled 2019-05-07: qty 2

## 2019-05-07 MED ORDER — ASPIRIN EC 81 MG PO TBEC
81.0000 mg | DELAYED_RELEASE_TABLET | Freq: Every day | ORAL | Status: DC
  Administered 2019-05-08: 81 mg via ORAL
  Filled 2019-05-07 (×2): qty 1

## 2019-05-07 MED ORDER — PANTOPRAZOLE SODIUM 40 MG PO TBEC
40.0000 mg | DELAYED_RELEASE_TABLET | Freq: Two times a day (BID) | ORAL | Status: DC
  Administered 2019-05-08 – 2019-05-09 (×4): 40 mg via ORAL
  Filled 2019-05-07 (×4): qty 1

## 2019-05-07 MED ORDER — DULOXETINE HCL 20 MG PO CPEP
40.0000 mg | ORAL_CAPSULE | Freq: Two times a day (BID) | ORAL | Status: DC
  Administered 2019-05-08 – 2019-05-09 (×3): 40 mg via ORAL
  Filled 2019-05-07 (×6): qty 2

## 2019-05-07 MED ORDER — ATORVASTATIN CALCIUM 10 MG PO TABS
10.0000 mg | ORAL_TABLET | Freq: Every day | ORAL | Status: DC
  Administered 2019-05-08 – 2019-05-09 (×2): 10 mg via ORAL
  Filled 2019-05-07 (×2): qty 1

## 2019-05-07 MED ORDER — ACETAMINOPHEN 650 MG RE SUPP: 650.0000 mg | Freq: Four times a day (QID) | RECTAL | Status: DC | PRN

## 2019-05-07 MED ORDER — LORATADINE 10 MG PO TABS
10.0000 mg | ORAL_TABLET | Freq: Every day | ORAL | Status: DC
  Administered 2019-05-08 – 2019-05-09 (×2): 10 mg via ORAL
  Filled 2019-05-07 (×2): qty 1

## 2019-05-07 NOTE — ED Provider Notes (Signed)
[facility] EMERGENCY DEPARTMENT Provider Note   [name]: [id number] Arrival date & time: 05/07/19  1036     History Chief Complaint  Patient presents with  . [name]    Deborah [name] is a 54 y.o. female with a past medical history of CHF, chronic pain, fibromyalgia, previous stroke, paroxysmal atrial fibrillation, nonischemic cardiomyopathy, obesity who presents to the emergency department with shortness of breath.  Patient states that she began feeling more short of breath last night, worsening this morning.  She states "it is my fluid.  I have got too much of it."  He does not know how much Lasix she takes but states that she has been taking it and "it is not working."  She states that she feels more short of breath when "having to talk."  She denies fever, chills, chest pain.  She denies hemoptysis.  She has associated peripheral edema past her knees.  kohut         Past Medical History:  Diagnosis Date  . Acute back pain with sciatica, left   . Acute back pain with sciatica, right   . AKI (acute kidney injury) ([place])   . Anemia, unspecified   . Chest pain 12/2015  . Chronic pain   . Chronic systolic CHF (congestive heart failure) ([place])   . Diabetes mellitus   . Esophageal reflux   . Fibromyalgia   . Gastric ulcer   . Gastroparesis   . Gout   . Hyperlipidemia   . Hypertension   . Hypokalemia   . Hypomagnesemia   . Lumbosacral stenosis   . NICM (nonischemic cardiomyopathy) ([place])   . Obesity   . PAF (paroxysmal atrial fibrillation) ([place])   . Pneumonia   . Stroke ([place]) 02/2011  . Vitamin B12 deficiency anemia     Patient Active Problem List   Diagnosis Date Noted  . Flu-like symptoms 04/05/2019  . Cough   . Acute on chronic congestive heart failure ([place])   . History of gastric ulcer   . Uncontrolled type 2 diabetes mellitus with hyperglycemia ([place])   . Fibroid uterus 02/23/2019  . Congestion of nasal sinus 01/24/2019  .  Chronic diastolic heart failure ([place]) 12/19/2018  . Intractable nausea and vomiting 12/17/2018  . Hypoxia 12/17/2018  . Hyperglycemia 12/17/2018  . Elevated troponin I level   . Vomiting 07/18/2018  . Abdominal pain 07/17/2018  . Hyperkalemia 07/17/2018  . Cardiac arrest ([name]) 11/29/2017  . Pelvic mass 11/29/2017  . Leukocytosis 11/29/2017  . Anxiety 11/29/2017  . Allergic reaction to contrast media, initial encounter 11/28/2017  . Palliative care encounter   . Back pain 03/19/2017  . Stroke (cerebrum) ([place]) 03/19/2017  . GERD (gastroesophageal reflux disease) 03/19/2017  . Depression 03/19/2017  . Obesity   . Urinary tract infection 08/16/2016  . Normocytic normochromic anemia 08/16/2016  . Gastroparesis 08/16/2016  . Intractable vomiting with nausea 06/17/2016  . Diabetic gastroparesis ([place]) 06/05/2016  . Gout 06/05/2016  . AKI (acute kidney injury) ([place]) 12/06/2015  . Chest pain 09/26/2015  . Hypokalemia 09/26/2015  . Hypomagnesemia 09/26/2015  . Nausea and vomiting 08/20/2015  . Gout flare 05/27/2015  . Lower abdominal pain 05/26/2015  . DKA (diabetic ketoacidoses) ([place]) 05/25/2015  . Uncontrolled type 2 diabetes mellitus with diabetic neuropathy, with long-term current use of insulin ([place]) 05/25/2015  . Type 2 diabetes mellitus with hyperglycemia, with long-term current use of insulin ([place]) 05/25/2015  . CKD (chronic kidney disease), stage II 05/25/2015  .  Essential hypertension, benign 09/28/2013    Past Surgical History:  Procedure Laterality Date  . CATARACT EXTRACTION  01/2014  . CHOLECYSTECTOMY       OB History   No obstetric history on file.     Family History  Problem Relation Age of Onset  . Diabetes Mother   . Diabetes Father   . Heart disease Father   . Diabetes Sister   . Congestive Heart Failure Sister 57  . Diabetes Brother     Social History   Tobacco Use  . Smoking status: Never Smoker  . Smokeless tobacco: Never Used  Substance Use  Topics  . Alcohol use: No  . Drug use: No    Home Medications Prior to Admission medications   Medication Sig Start Date End Date Taking? Authorizing Provider  albuterol (PROVENTIL) (2.5 MG/3ML) 0.083% nebulizer solution Take 3 mLs (2.5 mg total) by nebulization every 6 (six) hours as needed for wheezing or shortness of breath. 04/06/19   Scot Jun, FNP  allopurinol (ZYLOPRIM) 100 MG tablet Take 0.5 tablets (50 mg total) by mouth daily. 03/18/19   Lurline Del, DO  amLODipine (NORVASC) 10 MG tablet Take 10 mg by mouth every morning. 04/19/19   [provider]  apixaban (ELIQUIS) 5 MG TABS tablet Take 1 tablet (5 mg total) by mouth 2 (two) times daily. 03/17/19   Lurline Del, DO  aspirin EC 81 MG tablet Take 81 mg by mouth daily.    [provider]  atorvastatin (LIPITOR) 10 MG tablet Take 1 tablet (10 mg total) by mouth daily. 12/13/18   [name], DO  benzonatate (TESSALON) 100 MG capsule Take 1-2 capsules (100-200 mg total) by mouth 3 (three) times daily as needed for cough. 04/06/19   Scot Jun, FNP  blood glucose meter kit and supplies KIT Dispense based on patient and insurance preference. Use up to four times daily as directed. (FOR ICD-9 250.00, 250.01). 12/13/18   [name], DO  cetirizine (ZYRTEC) 10 MG tablet Take 1 tablet (10 mg total) by mouth daily. 12/13/18   [name], DO  dicyclomine (BENTYL) 20 MG tablet Take 1 tablet (20 mg total) by mouth 3 (three) times daily as needed for spasms (abdominal cramping). 04/06/19 05/18/19  [name], DO  DULoxetine HCl 40 MG CPEP Take 40 mg by mouth 2 (two) times daily. 02/16/19 05/18/19  [name], DO  fluticasone (FLONASE) 50 MCG/ACT nasal spray Place 2 sprays into both nostrils daily as needed for allergies or rhinitis. 12/19/18   Rai, Vernelle Emerald, MD  furosemide (LASIX) 40 MG tablet Take 1 tablet (40 mg total) by mouth daily. 03/18/19   Welborn, Ryan, DO  hydrALAZINE (APRESOLINE) 25  MG tablet TAKE ONE TABLET BY MOUTH THREE TIMES A DAY. FOR BLOOD PRESSURE Patient taking differently: Take 25 mg by mouth 3 (three) times daily. For blood pressure 04/19/19   Lockamy, Timothy, DO  insulin aspart (NOVOLOG FLEXPEN) 100 UNIT/ML FlexPen Inject 30 Units into the skin 4 (four) times daily.    [provider]  ipratropium (ATROVENT) 0.03 % nasal spray Place 2 sprays into both nostrils 2 (two) times daily. 04/06/19   Scot Jun, FNP  isosorbide mononitrate (IMDUR) 30 MG 24 hr tablet Take 0.5 tablets (15 mg total) by mouth daily. 03/18/19   Welborn, Ryan, DO  LANTUS SOLOSTAR 100 UNIT/ML Solostar Pen Inject 30 Units into the skin at bedtime. Patient taking differently: Inject 45 Units into the skin at bedtime.  03/17/19  Lurline Del, DO  loperamide (IMODIUM) 2 MG capsule Take 2 mg by mouth 4 (four) times daily as needed for diarrhea or loose stools.    [provider]  Melatonin 3 MG TABS Take 2 tablets (6 mg total) by mouth at bedtime. 12/13/18   [name], DO  metoCLOPramide (REGLAN) 10 MG tablet Take 1 tablet (10 mg total) by mouth 3 (three) times daily before meals. 12/19/18   Rai, Vernelle Emerald, MD  metoprolol succinate (TOPROL-XL) 50 MG 24 hr tablet Take 1 tablet (50 mg total) by mouth daily. Take with or immediately following a meal. 03/17/19   Welborn, Ryan, DO  nitroGLYCERIN (NITROSTAT) 0.4 MG SL tablet Place 1 tablet (0.4 mg total) under the tongue every 5 (five) minutes as needed for chest pain. 12/13/18   Lockamy, Timothy, DO  ondansetron (ZOFRAN ODT) 4 MG disintegrating tablet Take 1 tablet (4 mg total) by mouth every 8 (eight) hours as needed for nausea or vomiting. Patient not taking: Reported on 03/12/2019 03/10/19   Long, Wonda Olds, MD  pantoprazole (PROTONIX) 40 MG tablet Take 1 tablet (40 mg total) by mouth 2 (two) times daily. 03/17/19 05/18/19  Lurline Del, DO  potassium chloride 20 MEQ TBCR Take 20 mEq by mouth daily. 03/17/19   Lurline Del, DO     Allergies    Contrast media [iodinated diagnostic agents], Diazepam, Isovue [iopamidol], Lisinopril, Penicillins, Acetaminophen, Tolmetin, Aspirin, Food, Nsaids, and Tramadol  Review of Systems   Review of Systems Ten systems reviewed and are negative for acute change, except as noted in the HPI.   Physical Exam Updated Vital Signs BP 136/69 (BP Location: Right Wrist)   Pulse 96   Temp 98.8 F (37.1 C) (Oral)   Resp 20   LMP 10/10/2012   SpO2 99% Comment: Patient reports she is having diff breathing. placed on [place]  Physical Exam Vitals and nursing note reviewed.  Constitutional:      General: She is not in acute distress.    Appearance: She is well-developed. She is not diaphoretic.  HENT:     Head: Normocephalic and atraumatic.  Eyes:     General: No scleral icterus.    Conjunctiva/sclera: Conjunctivae normal.  Cardiovascular:     Rate and Rhythm: Normal rate and regular rhythm.     Heart sounds: Normal heart sounds. No murmur. No friction rub. No gallop.   Pulmonary:     Effort: Pulmonary effort is normal. No respiratory distress.     Breath sounds: Examination of the right-lower field reveals rales. Examination of the left-lower field reveals rales. Rales present.  Abdominal:     General: Bowel sounds are normal. There is no distension.     Palpations: Abdomen is soft. There is no mass.     Tenderness: There is no abdominal tenderness. There is no guarding.  Musculoskeletal:     Cervical back: Normal range of motion.     Right lower leg: Edema present.     Left lower leg: Edema present.  Skin:    General: Skin is warm and dry.  Neurological:     Mental Status: She is alert and oriented to person, place, and time.  Psychiatric:        Behavior: Behavior normal.     ED Results / Procedures / Treatments   Labs (all labs ordered are listed, but only abnormal results are displayed) Labs Reviewed  BASIC METABOLIC PANEL - Abnormal; Notable for the following  components:  Result Value   Potassium 3.2 (*)    Glucose, Bld 154 (*)    BUN 23 (*)    Creatinine, Ser 1.41 (*)    GFR calc non Af Amer 42 (*)    GFR calc Af Amer 48 (*)    All other components within normal limits  CBC - Abnormal; Notable for the following components:   RBC 3.12 (*)    Hemoglobin 8.6 (*)    HCT 29.3 (*)    MCHC 29.4 (*)    All other components within normal limits  BRAIN NATRIURETIC PEPTIDE - Abnormal; Notable for the following components:   B Natriuretic Peptide 127.1 (*)    All other components within normal limits  URINALYSIS, ROUTINE W REFLEX MICROSCOPIC - Abnormal; Notable for the following components:   APPearance HAZY (*)    Glucose, UA 50 (*)    Hgb urine dipstick SMALL (*)    Protein, ur >=300 (*)    All other components within normal limits  TROPONIN I (HIGH SENSITIVITY) - Abnormal; Notable for the following components:   Troponin I (High Sensitivity) 40 (*)    All other components within normal limits  I-STAT BETA HCG BLOOD, ED ([facility], [facility], [facility] ONLY)  POC SARS CORONAVIRUS 2 AG -  ED  TROPONIN I (HIGH SENSITIVITY)    EKG EKG Interpretation  Date/Time:  Monday May 07 2019 10:38:10 EST Ventricular Rate:  101 PR Interval:  166 QRS Duration: 88 QT Interval:  372 QTC Calculation: 482 R Axis:   -45 Text Interpretation: Sinus tachycardia Left axis deviation Abnormal ECG No STEMI Confirmed by Octaviano Glow [phone]) on 05/07/2019 4:50:08 PM   Radiology DG Chest Port 1 View  Result Date: 05/07/2019 CLINICAL DATA:  Onset respiratory distress and chest pain this morning. EXAM: PORTABLE CHEST 1 VIEW COMPARISON:  Single-view of the chest 03/15/2019 and PA and lateral chest 03/11/2019. FINDINGS: Heart size is upper normal in there is pulmonary vascular congestion. No consolidative process, pneumothorax or effusion. No acute or focal bony abnormality. IMPRESSION: Pulmonary vascular congestion. Electronically Signed   By: Inge Rise M.D.   On:  05/07/2019 15:29    Procedures Procedures (including critical care time)  Medications Ordered in ED Medications  sodium chloride flush (NS) 0.9 % injection 3 mL (has no administration in time range)  furosemide (LASIX) injection 40 mg (has no administration in time range)  potassium chloride 10 mEq in 100 mL IVPB (has no administration in time range)  potassium chloride SA (KLOR-CON) CR tablet 40 mEq (has no administration in time range)    ED Course  I have reviewed the triage vital signs and the nursing notes.  Pertinent labs & imaging results that were available during my care of the patient were reviewed by me and considered in my medical decision making (see chart for details).  Clinical Course as of May 06 1648  Mon May 07, 2019  1648 B Natriuretic Peptide(!): 127.1 [AH]    Clinical Course User Index [AH] Margarita Mail, PA-C   MDM Rules/Calculators/A&P                      54 year old female here with complaint of shortness of breath.  Patient demanding oxygen, she is on nasal cannula however there is no actual oxygen running.  She is speaking in full sentences without hypoxia.  CBC shows anemia without recent to compare.  Patient's BMP shows mild hypokalemia, elevated blood glucose, chronic renal insufficiency at baseline for  patient, negative pregnancy test, point-of-care Covid is negative, BNP is elevated today at 127.1.  Patient has had a normal BNP previously over the last 2 years.  She likely has volume overload.  Personally viewed the patient's 1 view chest x-ray which shows no evidence of edema however patient does complain that this feels like volume overload she does have some pulmonary vascular congestion.  EKG shows sinus tachycardia at a rate of 101 with left axis deviation.  Patient given to Dr. Yong Channel in signout at shift change.  He will assume care for appropriate disposition. Final Clinical Impression(s) / ED Diagnoses Final diagnoses:  None    Rx / DC  Orders ED Discharge Orders    None       Margarita Mail, PA-C 05/07/19 1650    Virgel Manifold, MD 05/08/19 [phone]

## 2019-05-07 NOTE — ED Notes (Signed)
Patient called and updated her family

## 2019-05-07 NOTE — ED Notes (Signed)
Port CXR being done at this time

## 2019-05-07 NOTE — ED Notes (Signed)
This RN explained to pt that we were going to give her PO Lasix.  Pt st's "that doesn't work for me".    MD made aware.

## 2019-05-07 NOTE — ED Notes (Signed)
Admitting MD in to assess pt for admission 

## 2019-05-07 NOTE — Progress Notes (Signed)
Received report from [place]. Room ready for patient. Jae Bruck, Wonda Cheng, Therapist, sports

## 2019-05-07 NOTE — ED Provider Notes (Signed)
Medical Decision Making: Care of patient assumed from [facility] PA-C at 1500.  Agree with history, physical exam and plan.  See their note for further details.  Briefly, 54 y.o. female with PMH/PSH as below.  Past Medical History:  Diagnosis Date  . Acute back pain with sciatica, left   . Acute back pain with sciatica, right   . AKI (acute kidney injury) ([place])   . Anemia, unspecified   . Chest pain 12/2015  . Chronic pain   . Chronic systolic CHF (congestive heart failure) ([place])   . Diabetes mellitus   . Esophageal reflux   . Fibromyalgia   . Gastric ulcer   . Gastroparesis   . Gout   . Hyperlipidemia   . Hypertension   . Hypokalemia   . Hypomagnesemia   . Lumbosacral stenosis   . NICM (nonischemic cardiomyopathy) ([place])   . Obesity   . PAF (paroxysmal atrial fibrillation) ([place])   . Pneumonia   . Stroke ([place]) 02/2011  . Vitamin B12 deficiency anemia    Past Surgical History:  Procedure Laterality Date  . CATARACT EXTRACTION  01/2014  . CHOLECYSTECTOMY       Patient HDS at handoff.    Plan at time of handoff: Here for SOB, states needs diuresis because she feels fluid overloaded. Normal O2 saturation on room air but requesting O2 for comfort.    Plan to follow up tests, likely diurese and reassess.    ED Course Pt BNP elevated above baseline, CXR with pulmonary vascular congestion.  Pt reports significant dyspnea with moving from bed to bedside commode.  No chest pain, troponin downtrending and lower than her typical levels, less likely ACS.  No pleuritic pain, no unilateral edema, not hypoxic, less likely PE at this time.   Attempted IV lasix for diuresis, US guided IV placed by IV team infiltrated prior to use.  I placed an US guided IV and catheter was visualized in the vein with easy blood drawback and easily flushable, however the pt reports pain with flushing of saline and requested the IV not be used.  Offered oral lasix and double her normal dose but pt refused.     Family medicine service consulted for admission for dyspnea on exertion, likely CHF exacerbation.  Admitted and awaiting bed assignment.   Clinical Impression 1. [name]         Tsion Inghram, [place], MD 05/08/19 [id number]    Virgel Manifold, MD 05/08/19 [phone]

## 2019-05-07 NOTE — ED Notes (Signed)
  IV team at bedside at this time to attempt IV access

## 2019-05-07 NOTE — ED Triage Notes (Signed)
Pt arrives to ED in respiratory distress and CP that started last night worse this morning. Pt has increased respirations but still able to speak in compete sentences. Pt is alert and ox4.

## 2019-05-07 NOTE — H&P (Addendum)
[facility] Admission History and Physical Service Pager: [phone]  Patient name: Deborah [name] Medical record number: [id number] Date of birth: 29-Aug-1965 Age: 54 y.o. Gender: female  Primary Care Provider: [name], DO Consultants: PT,  Code Status: Full Code  Preferred Emergency Contact: Leean Amezcua, Mother, [phone]  Chief Complaint: SOB and leg swelling   Assessment and Plan: [name] is a 54 y.o. female presenting with subjective worsening SOB and leg swelling. PMH is significant for cHFrEF, stroke, paroxysmal atrial fibrillation, HTN, HLD, gout, DM and GERD.   SOB likely 2/2 to volume overload from HFrEF 40-45% Patient reports being diagnosed with acute sinusitis recently and has not felt that her symptoms have improved. She reports that her symptoms began as a "head cold" and she has now progressed to having worsened SOB and increased leg swelling. Patient denies fevers at home and is afebrile upon presentation to the ED. WBC 9.7 and on exam is saturating 98-99% on RA. Patient has not had desaturations while in ED. Lung exam notable for wheezing in mid lung fields bilaterally. Patient speaking in full sentences and demonstrates labored breathing when leaning forward but not when sitting up with head of bed elevated. BNP is elevated at 127.1, troponin initially 40 and decreased to 36. EKG showed no ischemic changes and sinus tachycardia. Patient has significant bilateral LE edema but is difficult to assess how different this is from patient's baseline. Edema has mild if any pitting. Given patient's concern for worsening SOB and swelling, will administer IV lasix to help with pulmonary vascular congestion and monitor UOP. Leading diagnosis is mild heart failure exacerbation as cause of SOB. Left leg pain and decreased mobility can consider DVT but less likely as patient has stable vital signs without tachypnea or desaturations.  Suspicion for bacterial or viral PNA is low given patient has been afebrile without a cough or CXR findings consistent with infection, also has no leukocytosis to support this either. I highly suspect their are other causes leading to Ms Rouser's admission to the hospital as she did not meet any objective criteria for admission however she was adamant that she needed to be admitted. She did seem overly anxious and had multiple complaints and referenced several things like "heart attack" and "blot clot" as potential causes of her swelling and chest pain. I attempted to reassure her however, due to her claim that oral lasix was not working we will give IV Lasix and keep overnight. -admit to FPTS, attending Dr. Andria Frames -cardiac monitoring -lasix 80 once, daily lasix 54m - Consider switching to Torsemide oral if poor urine output -BMP, CBC, PT/INR -Tylenol 650 q 6 PRN  -Albuterol nebulizer  -Every 6 hours PRN  -continue ASA 868m -daily weights  -strict I/Os -vitals per floor protocol  -PT/OT  Type 2 diabetes, hyperglycemia Most recent blood glucose of 250. Most recent A1c 8.3.  Home medications include 45 units/day of Lantus as well as 30 units NovoLog 4 times per day.   -40 units Lantus -moderate sliding scale -Monitor CBGs with meals and at bedtime  CKD stage II Current creatinine 1.37.  Creatinine since early 2019 seems to range from 1.0-1.5 per chart review.  Cr within patient's baseline range.  -Daily BMP -monitor UOP on lasix   Hypertension Blood pressure is slightly elevated 141/79.  Home medications include metoprolol 25 mg, Lasix 40 mg, hydralazine 25 mg 3 times per day and amlodipine 10 mg.  -continue home metoprolol, hydralazine, and amlodipine  -  monitor BP with vitals   Hyperlipidemia Home medication includes 10 mg of atorvastatin.  -Continue home statin  Chronic Abdominal Pain  Home medications include bentyl 26m TID PRN.  -continue home bentyl    Hypokalemia K 3.2 on admission. Replenished K with 433m on admission once.  -2020mBID   Insomnia  Melatonin 6mg55mS  Gout Home allopurinol listed but patient reports not taking this medication currently.   Anxiety Home medications include duloxetine 30 mg  -Continue home duloxetine   GERD Home medications include pantoprazole 40 mg daily -Continue pantoprazole  Seasonal allergy Home medications include cetirizine, Flonase -Continue home medications  FEN/GI: DM diet     Bowel regimen with Miralax PRN  Prophylaxis: Lovenox   Disposition:  Admit to med-surg  History of Present Illness:  Deborah [name] 54 y62. female presenting with subjective SOB and leg swelling in the setting of recently being diagnosed with sinusitis.   Deborah [name] being diagnosed with acute sinusitis recently and has not felt that her symptoms have only worsened. She reports that her symptoms began as a "head cold" and she has now progressed to having worsened SOB which is making it difficult for her to walk short distances when she is not using her wheelchair. She also adds that she noticed that her legs have become swollen as well as her arms and she attributes that to her heart failure. She states that she has not had any fevers but reports some chills but further describes this as being due to the environment more so than rigors. She also reports lower abdominal pain but denies dysuria. She reports falling a few days ago and sustaining a dark mark on her leg that she is concerned may be a blood clot causing her to have worsening SOB.   In the ED, patient was difficult to gain peripheral access and declined oral lasix. Patient was placed on supplemental oxygen at her request for comfort and received 40mE72m K. CXR showed pulmonary vascular congestion. Patient's vitals showed oxygen saturations between 98-100% on RA and subsequently on requested supplemental oxygen.   Review Of  Systems: Per HPI with the following additions:   Review of Systems  Constitutional: Positive for chills. Negative for fever.  HENT: Positive for congestion and sinus pain. Negative for sore throat.   Respiratory: Positive for shortness of breath. Negative for cough.   Cardiovascular: Positive for chest pain and leg swelling.  Gastrointestinal: Positive for abdominal pain. Negative for nausea and vomiting.  Genitourinary: Negative for dysuria.  Musculoskeletal: Positive for falls.  Neurological: Negative for headaches.    Patient Active Problem List   Diagnosis Date Noted  . SOB (shortness of breath) 05/07/2019  . Flu-like symptoms 04/05/2019  . Cough   . Acute on chronic congestive heart failure (HCC) [place] History of gastric ulcer   . Uncontrolled type 2 diabetes mellitus with hyperglycemia (HCC) [place] Fibroid uterus 02/23/2019  . Congestion of nasal sinus 01/24/2019  . Chronic diastolic heart failure (HCC) [place]13/2020  . Intractable nausea and vomiting 12/17/2018  . Hypoxia 12/17/2018  . Hyperglycemia 12/17/2018  . Elevated troponin I level   . Vomiting 07/18/2018  . Abdominal pain 07/17/2018  . Hyperkalemia 07/17/2018  . Cardiac arrest (HCC) [place]24/2019  . Pelvic mass 11/29/2017  . Leukocytosis 11/29/2017  . Anxiety 11/29/2017  . Allergic reaction to contrast media, initial encounter 11/28/2017  . Palliative care encounter   . Back pain 03/19/2017  . Stroke (  cerebrum) ([place]) 03/19/2017  . GERD (gastroesophageal reflux disease) 03/19/2017  . Depression 03/19/2017  . Obesity   . Urinary tract infection 08/16/2016  . Normocytic normochromic anemia 08/16/2016  . Gastroparesis 08/16/2016  . Intractable vomiting with nausea 06/17/2016  . Diabetic gastroparesis ([place]) 06/05/2016  . Gout 06/05/2016  . AKI (acute kidney injury) ([place]) 12/06/2015  . Chest pain 09/26/2015  . Hypokalemia 09/26/2015  . Hypomagnesemia 09/26/2015  . Nausea and vomiting 08/20/2015  . Gout flare  05/27/2015  . Lower abdominal pain 05/26/2015  . DKA (diabetic ketoacidoses) ([place]) 05/25/2015  . Uncontrolled type 2 diabetes mellitus with diabetic neuropathy, with long-term current use of insulin ([place]) 05/25/2015  . Type 2 diabetes mellitus with hyperglycemia, with long-term current use of insulin ([place]) 05/25/2015  . CKD (chronic kidney disease), stage II 05/25/2015  . Essential hypertension, benign 09/28/2013    Past Medical History: Past Medical History:  Diagnosis Date  . Acute back pain with sciatica, left   . Acute back pain with sciatica, right   . AKI (acute kidney injury) ([place])   . Anemia, unspecified   . Chest pain 12/2015  . Chronic pain   . Chronic systolic CHF (congestive heart failure) ([place])   . Diabetes mellitus   . Esophageal reflux   . Fibromyalgia   . Gastric ulcer   . Gastroparesis   . Gout   . Hyperlipidemia   . Hypertension   . Hypokalemia   . Hypomagnesemia   . Lumbosacral stenosis   . NICM (nonischemic cardiomyopathy) ([place])   . Obesity   . PAF (paroxysmal atrial fibrillation) ([place])   . Pneumonia   . Stroke ([place]) 02/2011  . Vitamin B12 deficiency anemia     Past Surgical History: Past Surgical History:  Procedure Laterality Date  . CATARACT EXTRACTION  01/2014  . CHOLECYSTECTOMY      Social History: Social History   Tobacco Use  . Smoking status: Never Smoker  . Smokeless tobacco: Never Used  Substance Use Topics  . Alcohol use: No  . Drug use: No    Family History: Family History  Problem Relation Age of Onset  . Diabetes Mother   . Diabetes Father   . Heart disease Father   . Diabetes Sister   . Congestive Heart Failure Sister 35  . Diabetes Brother     Allergies and Medications: Allergies  Allergen Reactions  . Contrast Media [Iodinated Diagnostic Agents] Anaphylaxis    Cardiac arrest  . Diazepam [name]  . Isovue [Iopamidol] Anaphylaxis    Patient had seizure like activity and then code post 100 cc of  isovue 300  . Lisinopril Anaphylaxis    Tongue and mouth swelling  . Penicillins Palpitations    Has patient had a PCN reaction causing immediate rash, facial/tongue/throat swelling, SOB or lightheadedness with hypotension: Yes, heart races Has patient had a PCN reaction causing severe rash involving mucus membranes or skin necrosis: No Has patient had a PCN reaction that required hospitalization: Yes  Has patient had a PCN reaction occurring within the last 10 years: No   . Acetaminophen Nausea Only and Other (See Comments)    Irritates stomach ulcer  Abdominal pain  . Tolmetin Nausea Only and Other (See Comments)    ULCER  . Aspirin Other (See Comments)    Irritates stomach ulcer   . Food Hives and Swelling    Reaction to Carrots, ketchup   . Nsaids Other (See Comments)    ULCER  . Tramadol  Nausea And Vomiting   No current facility-administered medications on file prior to encounter.   Current Outpatient Medications on File Prior to Encounter  Medication Sig Dispense Refill  . albuterol (PROVENTIL) (2.5 MG/3ML) 0.083% nebulizer solution Take 3 mLs (2.5 mg total) by nebulization every 6 (six) hours as needed for wheezing or shortness of breath. 150 mL 1  . amLODipine (NORVASC) 10 MG tablet Take 10 mg by mouth every morning.    [name] aspirin EC 81 MG tablet Take 81 mg by mouth daily.    [name] atorvastatin (LIPITOR) 10 MG tablet Take 1 tablet (10 mg total) by mouth daily. 30 tablet 11  . cetirizine (ZYRTEC) 10 MG tablet Take 1 tablet (10 mg total) by mouth daily. 30 tablet 11  . dicyclomine (BENTYL) 20 MG tablet Take 1 tablet (20 mg total) by mouth 3 (three) times daily as needed for spasms (abdominal cramping). 90 tablet 0  . DULoxetine HCl 40 MG CPEP Take 40 mg by mouth 2 (two) times daily. 60 capsule 2  . fluticasone (FLONASE) 50 MCG/ACT nasal spray Place 2 sprays into both nostrils daily as needed for allergies or rhinitis. 16 g 2  . furosemide (LASIX) 40 MG tablet Take 1 tablet (40 mg  total) by mouth daily. 30 tablet 0  . hydrALAZINE (APRESOLINE) 25 MG tablet TAKE ONE TABLET BY MOUTH THREE TIMES A DAY. FOR BLOOD PRESSURE (Patient taking differently: Take 25 mg by mouth 3 (three) times daily. For blood pressure) 90 tablet 0  . insulin aspart (NOVOLOG FLEXPEN) 100 UNIT/ML FlexPen Inject 30 Units into the skin 4 (four) times daily.    [name] ipratropium (ATROVENT) 0.03 % nasal spray Place 2 sprays into both nostrils 2 (two) times daily. 30 mL 0  . LANTUS SOLOSTAR 100 UNIT/ML Solostar Pen Inject 30 Units into the skin at bedtime. (Patient taking differently: Inject 45 Units into the skin at bedtime. ) 15 mL 11  . loperamide (IMODIUM) 2 MG capsule Take 2 mg by mouth 4 (four) times daily as needed for diarrhea or loose stools.    . Melatonin 3 MG TABS Take 2 tablets (6 mg total) by mouth at bedtime. 30 tablet 6  . metoCLOPramide (REGLAN) 10 MG tablet Take 1 tablet (10 mg total) by mouth 3 (three) times daily before meals. 90 tablet 2  . metoprolol succinate (TOPROL-XL) 50 MG 24 hr tablet Take 1 tablet (50 mg total) by mouth daily. Take with or immediately following a meal. 30 tablet 0  . nitroGLYCERIN (NITROSTAT) 0.4 MG SL tablet Place 1 tablet (0.4 mg total) under the tongue every 5 (five) minutes as needed for chest pain. 30 tablet 1  . pantoprazole (PROTONIX) 40 MG tablet Take 1 tablet (40 mg total) by mouth 2 (two) times daily. 60 tablet 0  . allopurinol (ZYLOPRIM) 100 MG tablet Take 0.5 tablets (50 mg total) by mouth daily. (Patient not taking: Reported on 05/07/2019) 30 tablet 0  . apixaban (ELIQUIS) 5 MG TABS tablet Take 1 tablet (5 mg total) by mouth 2 (two) times daily. (Patient not taking: Reported on 05/07/2019) 60 tablet 0  . benzonatate (TESSALON) 100 MG capsule Take 1-2 capsules (100-200 mg total) by mouth 3 (three) times daily as needed for cough. (Patient not taking: Reported on 05/07/2019) 40 capsule 0  . blood glucose meter kit and supplies KIT Dispense based on patient and  insurance preference. Use up to four times daily as directed. (FOR ICD-9 250.00, 250.01). 1 each 0  . isosorbide  mononitrate (IMDUR) 30 MG 24 hr tablet Take 0.5 tablets (15 mg total) by mouth daily. (Patient not taking: Reported on 05/07/2019) 30 tablet 0  . ondansetron (ZOFRAN ODT) 4 MG disintegrating tablet Take 1 tablet (4 mg total) by mouth every 8 (eight) hours as needed for nausea or vomiting. (Patient not taking: Reported on 03/12/2019) 20 tablet 0  . potassium chloride 20 MEQ TBCR Take 20 mEq by mouth daily. (Patient not taking: Reported on 05/07/2019) 14 tablet 0    Objective: BP (!) 141/79 (BP Location: Right Arm)   Pulse (!) 101   Temp 97.7 F (36.5 C) (Oral)   Resp 20   Ht 5' 6" (1.676 m)   Wt (!) 161.5 kg   LMP 10/10/2012   SpO2 98%   BMI 57.47 kg/m  Exam: General: obese female, sitting up in bed in NAD  Eyes: EOMI bilaterally, no scleral icterus or conjunctival injection  Neck: supple with normal ROM  Cardiovascular: RRR without murmurs, gallops or friction rubs Respiratory: Upper to mid lung field wheezing, decreased breath sounds at bilateral bases, increased WOB when sitting up but normal WOB while lying down Gastrointestinal: tenderness in lower abdomen, bowel sounds present throughout  Derm: ~4cm area of hyperpigmentation on lateral aspect of LLE with mild tenderness to palpation  Neuro: alert and oriented x 4, follow commands, moves extremities with normal ROM  Psych: appropriate responses to examiner, somewhat anxious about health related issues   Labs and Imaging: CBC BMET  Recent Labs  Lab 05/07/19 2357  WBC 10.1  HGB 8.8*  HCT 29.8*  PLT 383   Recent Labs  Lab 05/07/19 1100 05/07/19 1100 05/07/19 2357  NA 140  --   --   K 3.2*  --   --   CL 108  --   --   CO2 22  --   --   BUN 23*  --   --   CREATININE 1.41*   < > 1.37*  GLUCOSE 154*  --   --   CALCIUM 8.9  --   --    < > = values in this interval not displayed.     EKG: sinus tachycardia   DG  Chest Port 1 View  Result Date: 05/07/2019 CLINICAL DATA:  Onset respiratory distress and chest pain this morning. EXAM: PORTABLE CHEST 1 VIEW COMPARISON:  Single-view of the chest 03/15/2019 and PA and lateral chest 03/11/2019. FINDINGS: Heart size is upper normal in there is pulmonary vascular congestion. No consolidative process, pneumothorax or effusion. No acute or focal bony abnormality. IMPRESSION: Pulmonary vascular congestion. Electronically Signed   By: Inge Rise M.D.   On: 05/07/2019 15:29     Stark Klein, MD 05/08/2019, 2:06 AM PGY-1, [place] [facility] pager: [phone], text pages welcome

## 2019-05-07 NOTE — ED Notes (Signed)
Patient states she feels swollen and sob onset yest. States she is taking her "fluid Pill " unsure of what and mg of what she takes. States she woke up this am with midsternal chest pain worse with inspiration and cough.  States she had a appointment with her PCP today however couldn't wait.

## 2019-05-07 NOTE — ED Notes (Signed)
IV in left upper arm placed by IV team does not flush.  Pt refuses to let this RN look for another access.

## 2019-05-08 ENCOUNTER — Other Ambulatory Visit: Payer: Self-pay

## 2019-05-08 DIAGNOSIS — K529 Noninfective gastroenteritis and colitis, unspecified: Secondary | ICD-10-CM | POA: Diagnosis not present

## 2019-05-08 DIAGNOSIS — K219 Gastro-esophageal reflux disease without esophagitis: Secondary | ICD-10-CM | POA: Diagnosis present

## 2019-05-08 DIAGNOSIS — E1122 Type 2 diabetes mellitus with diabetic chronic kidney disease: Secondary | ICD-10-CM | POA: Diagnosis present

## 2019-05-08 DIAGNOSIS — E785 Hyperlipidemia, unspecified: Secondary | ICD-10-CM | POA: Diagnosis present

## 2019-05-08 DIAGNOSIS — F419 Anxiety disorder, unspecified: Secondary | ICD-10-CM | POA: Diagnosis present

## 2019-05-08 DIAGNOSIS — I130 Hypertensive heart and chronic kidney disease with heart failure and stage 1 through stage 4 chronic kidney disease, or unspecified chronic kidney disease: Secondary | ICD-10-CM | POA: Diagnosis present

## 2019-05-08 DIAGNOSIS — G8929 Other chronic pain: Secondary | ICD-10-CM | POA: Diagnosis present

## 2019-05-08 DIAGNOSIS — E1165 Type 2 diabetes mellitus with hyperglycemia: Secondary | ICD-10-CM | POA: Diagnosis present

## 2019-05-08 DIAGNOSIS — M109 Gout, unspecified: Secondary | ICD-10-CM | POA: Diagnosis present

## 2019-05-08 DIAGNOSIS — R5381 Other malaise: Secondary | ICD-10-CM

## 2019-05-08 DIAGNOSIS — N182 Chronic kidney disease, stage 2 (mild): Secondary | ICD-10-CM | POA: Diagnosis present

## 2019-05-08 DIAGNOSIS — E1143 Type 2 diabetes mellitus with diabetic autonomic (poly)neuropathy: Secondary | ICD-10-CM | POA: Diagnosis present

## 2019-05-08 DIAGNOSIS — Z7901 Long term (current) use of anticoagulants: Secondary | ICD-10-CM | POA: Diagnosis not present

## 2019-05-08 DIAGNOSIS — Z6841 Body Mass Index (BMI) 40.0 and over, adult: Secondary | ICD-10-CM | POA: Diagnosis not present

## 2019-05-08 DIAGNOSIS — E876 Hypokalemia: Secondary | ICD-10-CM | POA: Diagnosis present

## 2019-05-08 DIAGNOSIS — Z8249 Family history of ischemic heart disease and other diseases of the circulatory system: Secondary | ICD-10-CM | POA: Diagnosis not present

## 2019-05-08 DIAGNOSIS — E669 Obesity, unspecified: Secondary | ICD-10-CM

## 2019-05-08 DIAGNOSIS — Z609 Problem related to social environment, unspecified: Secondary | ICD-10-CM

## 2019-05-08 DIAGNOSIS — I5023 Acute on chronic systolic (congestive) heart failure: Secondary | ICD-10-CM

## 2019-05-08 DIAGNOSIS — I480 Paroxysmal atrial fibrillation: Secondary | ICD-10-CM | POA: Diagnosis present

## 2019-05-08 DIAGNOSIS — J984 Other disorders of lung: Secondary | ICD-10-CM

## 2019-05-08 DIAGNOSIS — Z20822 Contact with and (suspected) exposure to covid-19: Secondary | ICD-10-CM | POA: Diagnosis present

## 2019-05-08 DIAGNOSIS — R0602 Shortness of breath: Secondary | ICD-10-CM | POA: Diagnosis present

## 2019-05-08 DIAGNOSIS — K3184 Gastroparesis: Secondary | ICD-10-CM | POA: Diagnosis present

## 2019-05-08 DIAGNOSIS — G4700 Insomnia, unspecified: Secondary | ICD-10-CM | POA: Diagnosis present

## 2019-05-08 DIAGNOSIS — J441 Chronic obstructive pulmonary disease with (acute) exacerbation: Secondary | ICD-10-CM | POA: Diagnosis present

## 2019-05-08 DIAGNOSIS — N183 Chronic kidney disease, stage 3 unspecified: Secondary | ICD-10-CM | POA: Diagnosis present

## 2019-05-08 DIAGNOSIS — Z833 Family history of diabetes mellitus: Secondary | ICD-10-CM | POA: Diagnosis not present

## 2019-05-08 DIAGNOSIS — J302 Other seasonal allergic rhinitis: Secondary | ICD-10-CM | POA: Diagnosis present

## 2019-05-08 LAB — BASIC METABOLIC PANEL
Anion gap: 9 (ref 5–15)
BUN: 25 mg/dL — ABNORMAL HIGH (ref 6–20)
CO2: 21 mmol/L — ABNORMAL LOW (ref 22–32)
Calcium: 8.8 mg/dL — ABNORMAL LOW (ref 8.9–10.3)
Chloride: 109 mmol/L (ref 98–111)
Creatinine, Ser: 1.29 mg/dL — ABNORMAL HIGH (ref 0.44–1.00)
GFR calc Af Amer: 54 mL/min — ABNORMAL LOW (ref >60)
GFR calc non Af Amer: 47 mL/min — ABNORMAL LOW (ref >60)
Glucose, Bld: 278 mg/dL — ABNORMAL HIGH (ref 70–99)
Potassium: 3.2 mmol/L — ABNORMAL LOW (ref 3.5–5.1)
Sodium: 139 mmol/L (ref 135–145)

## 2019-05-08 LAB — CBC
HCT: 26.6 % — ABNORMAL LOW (ref 36.0–46.0)
HCT: 29.8 % — ABNORMAL LOW (ref 36.0–46.0)
Hemoglobin: 8.2 g/dL — ABNORMAL LOW (ref 12.0–15.0)
Hemoglobin: 8.8 g/dL — ABNORMAL LOW (ref 12.0–15.0)
MCH: 27.8 pg (ref 26.0–34.0)
MCH: 27.9 pg (ref 26.0–34.0)
MCHC: 29.5 g/dL — ABNORMAL LOW (ref 30.0–36.0)
MCHC: 30.8 g/dL (ref 30.0–36.0)
MCV: 90.5 fL (ref 80.0–100.0)
MCV: 94.3 fL (ref 80.0–100.0)
Platelets: 349 10*3/uL (ref 150–400)
Platelets: 383 10*3/uL (ref 150–400)
RBC: 2.94 MIL/uL — ABNORMAL LOW (ref 3.87–5.11)
RBC: 3.16 MIL/uL — ABNORMAL LOW (ref 3.87–5.11)
RDW: 15.1 % (ref 11.5–15.5)
RDW: 15.2 % (ref 11.5–15.5)
WBC: 10.1 10*3/uL (ref 4.0–10.5)
WBC: 9.6 10*3/uL (ref 4.0–10.5)
nRBC: 0 % (ref 0.0–0.2)
nRBC: 0 % (ref 0.0–0.2)

## 2019-05-08 LAB — CREATININE, SERUM
Creatinine, Ser: 1.37 mg/dL — ABNORMAL HIGH (ref 0.44–1.00)
GFR calc Af Amer: 50 mL/min — ABNORMAL LOW (ref >60)
GFR calc non Af Amer: 43 mL/min — ABNORMAL LOW (ref >60)

## 2019-05-08 LAB — SARS CORONAVIRUS 2 (TAT 6-24 HRS): SARS Coronavirus 2: NEGATIVE

## 2019-05-08 LAB — GLUCOSE, CAPILLARY
Glucose-Capillary: 231 mg/dL — ABNORMAL HIGH (ref 70–99)
Glucose-Capillary: 250 mg/dL — ABNORMAL HIGH (ref 70–99)
Glucose-Capillary: 260 mg/dL — ABNORMAL HIGH (ref 70–99)
Glucose-Capillary: 268 mg/dL — ABNORMAL HIGH (ref 70–99)
Glucose-Capillary: 309 mg/dL — ABNORMAL HIGH (ref 70–99)

## 2019-05-08 LAB — PROTIME-INR
INR: 1.2 (ref 0.8–1.2)
Prothrombin Time: 15 seconds (ref 11.4–15.2)

## 2019-05-08 LAB — HEMOGLOBIN A1C
Hgb A1c MFr Bld: 8.3 % — ABNORMAL HIGH (ref 4.8–5.6)
Mean Plasma Glucose: 191.51 mg/dL

## 2019-05-08 LAB — TSH: TSH: 2.179 u[IU]/mL (ref 0.350–4.500)

## 2019-05-08 MED ORDER — GABAPENTIN 300 MG PO CAPS
300.0000 mg | ORAL_CAPSULE | Freq: Once | ORAL | Status: DC
  Filled 2019-05-08: qty 1

## 2019-05-08 MED ORDER — FUROSEMIDE 10 MG/ML IJ SOLN
80.0000 mg | Freq: Once | INTRAMUSCULAR | Status: AC
  Administered 2019-05-08: 80 mg via INTRAVENOUS
  Filled 2019-05-08: qty 8

## 2019-05-08 MED ORDER — TORSEMIDE 20 MG PO TABS
40.0000 mg | ORAL_TABLET | Freq: Every day | ORAL | Status: DC
  Administered 2019-05-08 – 2019-05-09 (×2): 40 mg via ORAL
  Filled 2019-05-08 (×2): qty 2

## 2019-05-08 MED ORDER — ENOXAPARIN SODIUM 80 MG/0.8ML ~~LOC~~ SOLN
80.0000 mg | Freq: Every day | SUBCUTANEOUS | Status: DC
  Administered 2019-05-08: 80 mg via SUBCUTANEOUS
  Filled 2019-05-08: qty 0.8

## 2019-05-08 MED ORDER — TORSEMIDE 20 MG PO TABS: 40.0000 mg | ORAL_TABLET | Freq: Every day | ORAL | Status: DC

## 2019-05-08 MED ORDER — HYDROXYZINE HCL 10 MG PO TABS
10.0000 mg | ORAL_TABLET | Freq: Three times a day (TID) | ORAL | Status: DC | PRN
  Administered 2019-05-08: 10 mg via ORAL
  Filled 2019-05-08: qty 1

## 2019-05-08 MED ORDER — DICLOFENAC SODIUM 1 % EX GEL
2.0000 g | Freq: Four times a day (QID) | CUTANEOUS | Status: DC
  Filled 2019-05-08: qty 100

## 2019-05-08 NOTE — Progress Notes (Signed)
Patient instructed to ambulate with no oxygen, in room air with 100 O2 Sat, refuse to ambulate at this time.

## 2019-05-08 NOTE — Plan of Care (Signed)
  Problem: Education: Goal: Knowledge of General Education information will improve Description Including pain rating scale, medication(s)/side effects and non-pharmacologic comfort measures Outcome: Progressing   Problem: Clinical Measurements: Goal: Diagnostic test results will improve Outcome: Progressing Goal: Respiratory complications will improve Outcome: Progressing   

## 2019-05-08 NOTE — Progress Notes (Signed)
Patient requesting to get IV lasix as she did not take the oral lasix that was ordered earlier in the ED. MD on call text paged. Order received and carried out. Morenike Cuff, Wonda Cheng, Therapist, sports

## 2019-05-08 NOTE — Progress Notes (Addendum)
FPTS Interim Progress Note  S: Received page that Ms. Juran was experiencing 10/10 diffuse abdominal pain. Patient reports nausea but no emesis at the time of my exam. Patient states that she has chronic diarrhea and has noticed no change to appearance of her stool or frequency since being admitted but does report 10 episodes of liquid diarrhea today (2 stool occurrences documented). Patient denies subjective fever and continues to report SOB (chief complaint on admission). Temp measured while at the bedside and was afebrile with temp of 97. Patient localizes the pain to the lower part of her abdomen. She initially described the pain as a cramp but later reported it as a "pain" that is worsening. Patient adds that she normally receives morphine for abdominal pain this severe. I informed the patient of my concern that opioids would likely not help her abdominal pain. She then emphasized that she needs something stronger than Bentyl and could not take NSAIDS of Tylenol due to allergies.        O: BP (!) 142/82 (BP Location: Right Arm)   Pulse 90   Temp 97.8 F (36.6 C) (Oral)   Resp 18   Ht 5\' 6"  (1.676 m)   Wt (!) 161.5 kg   LMP 10/10/2012   SpO2 100%   BMI 57.47 kg/m General: obese female sitting up in chair, in NAD Abdomen: obese abdomen, diffuse tenderness to palpation, distant bowel sounds appreciated throughout, no rebound tenderness  A/P:  Abdominal Pain, likely chronic or Gastroparesis   -kpad to apply to abdomen  -Gabapentin 300mg  one time dose   Stark Klein, MD 05/08/2019, 11:31 PM PGY-, [place] [facility] pager [phone]

## 2019-05-08 NOTE — Evaluation (Signed)
Physical Therapy Evaluation Patient Details Name: Deborah [name] MRN: [id number] DOB: Oct 06, 1965 Today's Date: 05/08/2019   History of Present Illness  Pt is 54 yo female with PMH significant for CHF, CVA, afib, HTN, HLC, gout, DM, and GERD. She presented with [facility] and leg swelling; admitted with volume overload from HF.  Clinical Impression   Pt admitted with above diagnosis. Pt with low activity tolerance at baseline and minimal interest in PT today.  She was agreeable to eval and transfer to chair.  Initially, reported no ambulation at baseline but then stated she does walk from car, up 3 steps, then to her w/c in home.  Pt reports that she is moving to level entry home in 2 weeks.  Pt was able to stand pivot with min guard.  She was at EOB upon arrival (just finished OT).  Pt currently with functional limitations due to the deficits listed below (see PT Problem List). Pt will benefit from skilled PT to increase their independence and safety with mobility to allow discharge to the venue listed below.       Follow Up Recommendations Home health PT    Equipment Recommendations  None recommended by PT    Recommendations for Other Services       Precautions / Restrictions Precautions Precautions: Fall Restrictions Weight Bearing Restrictions: No      Mobility  Bed Mobility Overal bed mobility: Needs Assistance Bed Mobility: Supine to Sit     Supine to sit: Supervision     General bed mobility comments: At EOB upon arrival  Transfers Overall transfer level: Needs assistance Equipment used: None Transfers: Sit to/from [employer] Transfers Sit to Stand: Min guard Stand pivot transfers: Min guard       General transfer comment: increased time and effort  Ambulation/Gait Ambulation/Gait assistance: Min guard Gait Distance (Feet): 2 Feet Assistive device: None Gait Pattern/deviations: Decreased stride length;Wide base of support;Trunk flexed     General  Gait Details: steps to chair only  Stairs            Wheelchair Mobility    Modified Rankin (Stroke Patients Only)       Balance Overall balance assessment: Needs assistance Sitting-balance support: No upper extremity supported;Feet supported Sitting balance-Leahy Scale: Good     Standing balance support: During functional activity Standing balance-Leahy Scale: Fair                               Pertinent Vitals/Pain Pain Assessment: Faces Faces Pain Scale: Hurts even more Pain Location: bil legs Pain Descriptors / Indicators: Grimacing;Guarding;Aching Pain Intervention(s): Limited activity within patient's tolerance;Monitored during session    Home Living Family/patient expects to be discharged to:: Private residence Living Arrangements: Alone Available Help at Discharge: Family;Available PRN/intermittently Type of Home: Apartment Home Access: Stairs to enter Entrance Stairs-Rails: Right;Left;Can reach both Entrance Stairs-Number of Steps: 3 Home Layout: One level Home Equipment: Bedside commode;Shower seat;Wheelchair - [employer] - 2 wheels;Tub bench;Walker - 4 wheels      Prior Function Level of Independence: Needs assistance   Gait / Transfers Assistance Needed: uses WC mainly for moblity pushing with feet. performs bed mobility and stand pivot transfers independently - initially reported that she did not walk at all, but then reported she has 3 steps to get in her home and could walk from car and up the steps with supervision; reports she is moving to apt with level entry in a couple  weeks  ADL's / Homemaking Assistance Needed: independent ADLs, limited IADLs         Hand Dominance   Dominant Hand: Right    Extremity/Trunk Assessment   Upper Extremity Assessment Upper Extremity Assessment: Defer to OT evaluation    Lower Extremity Assessment Lower Extremity Assessment: (Demonstrating at least 3/5 and functional strength but  declined MMT due to leg pain)    Cervical / Trunk Assessment Cervical / Trunk Assessment: Kyphotic  Communication   Communication: No difficulties  Cognition Arousal/Alertness: Awake/alert Behavior During Therapy: WFL for tasks assessed/performed Overall Cognitive Status: Within Functional Limits for tasks assessed                                 General Comments: pt not overy interested in working with therapy but did agree      General Comments General comments (skin integrity, edema, etc.): On RA with O2 sats 99% rest and 96% with transfers.  When PT expressed concern over steps , pt stated "I can do those, with help" reports family close by to assist    Exercises     Assessment/Plan    PT Assessment Patient needs continued PT services  PT Problem List Decreased strength;Decreased mobility;Decreased safety awareness;Decreased activity tolerance;Cardiopulmonary status limiting activity;Decreased balance;Decreased knowledge of use of DME       PT Treatment Interventions DME instruction;Therapeutic activities;Gait training;Therapeutic exercise;Patient/family education;Stair training;Balance training;Functional mobility training    PT Goals (Current goals can be found in the Care Plan section)  Acute Rehab PT Goals Patient Stated Goal: go home tomorrow PT Goal Formulation: With patient Time For Goal Achievement: 05/22/19 Potential to Achieve Goals: Good    Frequency Min 3X/week   Barriers to discharge Inaccessible home environment;Decreased caregiver support      Co-evaluation               AM-PAC PT "6 Clicks" Mobility  Outcome Measure Help needed turning from your back to your side while in a flat bed without using bedrails?: A Little Help needed moving from lying on your back to sitting on the side of a flat bed without using bedrails?: A Little Help needed moving to and from a bed to a chair (including a wheelchair)?: None Help needed standing  up from a chair using your arms (e.g., wheelchair or bedside chair)?: None Help needed to walk in hospital room?: A Little Help needed climbing 3-5 steps with a railing? : A Little 6 Click Score: 20    End of Session   Activity Tolerance: Patient tolerated treatment well Patient left: in chair;with call bell/phone within reach Nurse Communication: Mobility status PT Visit Diagnosis: Other abnormalities of gait and mobility (R26.89)    Time: 1412-1430 PT Time Calculation (min) (ACUTE ONLY): 18 min   Charges:   PT Evaluation $PT Eval Low Complexity: 1 Low          Maggie Font, PT Acute Rehab Services Pager [phone] [facility] [phone] [facility] [place] 05/08/2019, 2:39 PM

## 2019-05-08 NOTE — Progress Notes (Signed)
Inpatient Diabetes Program Recommendations  AACE/ADA: New Consensus Statement on Inpatient Glycemic Control   Target Ranges:  Prepandial:   less than 140 mg/dL      Peak postprandial:   less than 180 mg/dL (1-2 hours)      Critically ill patients:  140 - 180 mg/dL   Results for MAYOLA, MCBAIN (MRN [id number]) as of 05/08/2019 09:45  Ref. Range 05/08/2019 00:44 05/08/2019 07:02  Glucose-Capillary Latest Ref Range: 70 - 99 mg/dL 250 (H) 260 (H)  Results for LACIE, LANDRY (MRN [id number]) as of 05/08/2019 09:45  Ref. Range 02/20/2019 06:20 05/07/2019 23:57  Hemoglobin A1C Latest Ref Range: 4.8 - 5.6 % 9.6 (H) 8.3 (H)   Review of Glycemic Control  Diabetes history: DM2 Outpatient Diabetes medications: Lantus 45 units QHS, Novolog 30 units QID Current orders for Inpatient glycemic control: Lantus 40 units QHS, Novolog 0-15 units TID with meals  Inpatient Diabetes Program Recommendations:   Insulin-Basal: Noted Lantus 40 units QHS was ordered last night but to start tonight. Fasting glucose 260 mg/dl. If patient to remain inpatient, may want to consider changing frequency to daily so patient can receive it this morning.   NOTE: Patient was recently inpatient 03/12/19 to 03/17/19 and was seen by inpatient diabetes coordinator on 03/14/19 during that admission. In reviewing chart, noted patient has Lantus 40 units QHS ordered but to start tonight. Patient did not receive any Lantus last night and fasting glucose 260 mg/dl today.   Thanks, Barnie Alderman, RN, MSN, CDE Diabetes Coordinator Inpatient Diabetes Program [phone] (Team Pager from 8am to 5pm)

## 2019-05-08 NOTE — Progress Notes (Signed)
Patient refused bed alarm. Patient aware to call for help before getting out of bed. Call bell within reach. Tinsley Lomas, Wonda Cheng, Therapist, sports

## 2019-05-08 NOTE — Evaluation (Signed)
Occupational Therapy Evaluation Patient Details Name: Deborah [name] MRN: [id number] DOB: 09-16-1965 Today's Date: 05/08/2019    History of Present Illness Deborah [name] is a 54 y.o. female with a past medical history of CHF, chronic pain, fibromyalgia, previous stroke, paroxysmal atrial fibrillation, nonischemic cardiomyopathy, obesity who presents to the emergency department with shortness of breath   Clinical Impression   Pt with decline in function and safety with ADLs and ADL mobility with impaired strength, balance and endurance. Pt limited by pain in B knees. Pt lives at home alone in an apartment and was independent with ADLs.selfcare, home mgt and use a w/c from mobility. Pt currently requires sup with bed mobility to sit EOB, mod A with LB bathing, min guard A for sit- stand and SPT to BSC, min guard A with toileting/hygiene. Pt would benefit from acute OT services to address impairments to maximize level of function and safety    Follow Up Recommendations  Home health OT    Equipment Recommendations   none   Recommendations for Other Services       Precautions / Restrictions Precautions Precautions: Fall Restrictions Weight Bearing Restrictions: No      Mobility Bed Mobility Overal bed mobility: Needs Assistance Bed Mobility: Supine to Sit     Supine to sit: Supervision        Transfers Overall transfer level: Needs assistance   Transfers: Sit to/from Stand;Stand Pivot Transfers Sit to Stand: Min guard Stand pivot transfers: Min guard       General transfer comment: increased time and effort    Balance Overall balance assessment: Needs assistance Sitting-balance support: No upper extremity supported;Feet unsupported Sitting balance-Leahy Scale: Good     Standing balance support: During functional activity Standing balance-Leahy Scale: Poor                             ADL either performed or assessed with clinical judgement    ADL Overall ADL's : Needs assistance/impaired Eating/Feeding: Independent;Sitting   Grooming: Wash/dry hands;Wash/dry face;Set up;Sitting   Upper Body Bathing: Set up;Sitting   Lower Body Bathing: Moderate assistance;Sitting/lateral leans;Sit to/from stand   Upper Body Dressing : Set up;Sitting     Lower Body Dressing Details (indicate cue type and reason): pt states that she wears gowns at home Toilet Transfer: Min guard;Stand-pivot   [place] and Hygiene: Min guard;Sit to/from stand       Functional mobility during ADLs: Min guard       Vision Baseline Vision/History: Wears glasses Wears Glasses: Reading only Patient Visual Report: No change from baseline       Perception     Praxis      Pertinent Vitals/Pain Pain Assessment: Faces Faces Pain Scale: Hurts little more Pain Location: knees Pain Descriptors / Indicators: Grimacing;Guarding;Aching Pain Intervention(s): Limited activity within patient's tolerance;Monitored during session;Premedicated before session;Repositioned     Hand Dominance Right   Extremity/Trunk Assessment Upper Extremity Assessment Upper Extremity Assessment: Overall WFL for tasks assessed   Lower Extremity Assessment Lower Extremity Assessment: Defer to PT evaluation       Communication Communication Communication: No difficulties   Cognition Arousal/Alertness: Awake/alert Behavior During Therapy: WFL for tasks assessed/performed Overall Cognitive Status: Within Functional Limits for tasks assessed                                 General Comments: pt  annoyed to work with therapy   General Comments       Exercises     Shoulder Instructions      Home Living Family/patient expects to be discharged to:: Private residence(apartment) Living Arrangements: Alone Available Help at Discharge: Family;Available PRN/intermittently Type of Home: Apartment Home Access: Level entry     Home  Layout: One level     Bathroom Shower/Tub: Teacher, early years/pre: Standard     Home Equipment: Bedside commode;Shower seat;Wheelchair - [employer] - 2 wheels;Tub bench          Prior Functioning/Environment Level of Independence: Needs assistance  Gait / Transfers Assistance Needed: uses WC mainly for moblity pushing with feet. performs bed mobility and stand pivot transfers independently ADL's / Homemaking Assistance Needed: independent ADLs, limited IADLs             OT Problem List: Impaired balance (sitting and/or standing);Pain;Decreased activity tolerance      OT Treatment/Interventions: Self-care/ADL training;DME and/or AE instruction;Therapeutic activities;Patient/family education    OT Goals(Current goals can be found in the care plan section) Acute Rehab OT Goals Patient Stated Goal: go home tomorrow OT Goal Formulation: With patient Time For Goal Achievement: 05/22/19 Potential to Achieve Goals: Good ADL Goals Pt Will Perform Lower Body Bathing: with min assist;with min guard assist;sit to/from stand;sitting/lateral leans Pt Will Transfer to Toilet: with supervision;with modified independence;bedside commode Pt Will Perform Toileting - Clothing Manipulation and hygiene: with supervision;with modified independence;sit to/from stand  OT Frequency: Min 2X/week   Barriers to D/C:            Co-evaluation              AM-PAC OT "6 Clicks" Daily Activity     Outcome Measure Help from another person eating meals?: None Help from another person taking care of personal grooming?: None Help from another person toileting, which includes using toliet, bedpan, or urinal?: A Little Help from another person bathing (including washing, rinsing, drying)?: A Lot Help from another person to put on and taking off regular upper body clothing?: None Help from another person to put on and taking off regular lower body clothing?: A Lot 6 Click Score: 19    End of Session Equipment Utilized During Treatment: Other (comment)(BSC)  Activity Tolerance: Patient limited by fatigue Patient left: in bed;Other (comment)(sitting EOB)  OT Visit Diagnosis: Other abnormalities of gait and mobility (R26.89);Pain Pain - Right/Left: (bilaterally) Pain - part of body: Knee                Time: 1350-1403 OT Time Calculation (min): 13 min Charges:  OT General Charges $OT Visit: 1 Visit OT Evaluation $OT Eval Moderate Complexity: 1 Mod    Britt Bottom 05/08/2019, 2:17 PM

## 2019-05-08 NOTE — Progress Notes (Signed)
New Admission Note:   Arrival Method: Arrived from [facility] ED via wheelchair Mental Orientation: Alert and oriented x4 Telemetry: Box #3 Assessment: Completed Skin: See docflowsheet IV: NSL-LUA Pain: 0/10 Tubes: N/A Safety Measures: Safety Fall Prevention Plan has been discussed.  Admission: Completed 5MW Orientation: Patient has been orientated to the room, unit and staff.  Family: None at bedside  Orders have been reviewed and implemented. Will continue to monitor the patient. Call light has been placed within reach and bed alarm has been activated.   Omolara Carol [employer], RN-BC Phone number: [phone]

## 2019-05-08 NOTE — Hospital Course (Addendum)
Deborah [name] is a 54 y.o. female presenting with subjective worsening SOB and leg swelling. PMH is significant for cHFrEF, stroke, paroxysmal atrial fibrillation, HTN, HLD, gout, DM and GERD.   Worsening SOB likely 2/2 to Recent Sinus Infection   CHFrEF ***

## 2019-05-08 NOTE — Progress Notes (Signed)
Patient refused bed alarm. Advised to call for assistance before getting out of bed,patient verbalized understanding. Call bell within reach. Athen Riel, Wonda Cheng, Therapist, sports

## 2019-05-08 NOTE — Plan of Care (Signed)
  Problem: Activity: Goal: Risk for activity intolerance will decrease Outcome: Progressing   

## 2019-05-08 NOTE — Progress Notes (Signed)
Family Medicine Teaching Service Daily Progress Note Intern Pager: [phone]  Patient name: Deborah [name] Medical record number: [id number] Date of birth: 05/19/1965 Age: 54 y.o. Gender: female  Primary Care Provider: [name], DO Consultants: None Code Status: Full code  Pt Overview and Major Events to Date:  Admitted 05/07/2019 for shortness of breath  Assessment and Plan: Deborah [name] is a 54 y.o. female presenting with subjective worsening SOB and leg swelling. PMH is significant for cHFrEF, stroke, paroxysmal atrial fibrillation, HTN, HLD, gout, DM and GERD.   Possible CHF exacerbation with history of HFrEF 40-45% On admission patient reports she has had a head cold and has subjective shortness of breath.  She was satting 100% on room air but felt like she was having difficulty catching her breath.  WBC-9.7.  BNP was mildly elevated at 127.1, troponin initially 40 and trended to 36.  EKG showed no ischemic changes and sinus tachycardia.  Patient had bilateral lower extremity edema on admission and it was noted that she had a 21 pound weight gain.  Patient complains of issues with leg pain that is chronic when she has edema. -Continuous cardiac monitoring -Lasix 80 mg IV given once -Torsemide 40 mg daily -Daily BMP, CBC -Albuterol nebulizer -Continue ASA 81 mg -Daily weights -Strict I's and O's -Vitals per floor protocol -PT recommended outpatient physical therapy -OT recommended home health OT -Voltaren gel for lower extremity pain  Type 2 diabetes, hyperglycemia Most recent blood glucose of 250. Most recent A1c 8.3. Home medications include 45 units/day of Lantus as well as 30 units NovoLog 4 times per day.  -40 units Lantus -moderate sliding scale -Monitor CBGs with meals and at bedtime  CKD stage II Current creatinine 1.37. Creatinine since early 2019 seems to range from 1.0-1.5 per chart review. Cr within patient's baseline range.  -Daily  BMP -monitor UOP on lasix   Hypertension Blood pressure is slightly elevated 141/79. Home medications include metoprolol 25 mg, Lasix 40 mg, hydralazine 25 mg 3 times per day and amlodipine 10 mg.  -continue home metoprolol, hydralazine, and amlodipine  -Patient was given 80 mg IV Lasix on admission -Transition to 40 mg p.o. torsemide today -Holding hydralazine at this time -monitor BP with vitals   Hyperlipidemia Home medication includes 10 mg of atorvastatin.  -Continue home statin  Chronic Abdominal Pain  Home medications include bentyl 20mg  TID PRN.  -continue home bentyl   Hypokalemia K 3.2 on admission. Replenished K with 40mEq on admission once.  -61mEq BID   Insomnia  Melatonin 6mg  qHS  Gout Home allopurinol listed but patient reports not taking this medication currently.   Anxiety Home medications include duloxetine 30 mg  -Continue home duloxetine   GERD Home medications include pantoprazole 40 mg daily -Continue pantoprazole  Seasonal allergy Home medications include cetirizine, Flonase -Continue home medications  FEN/GI: Diabetes diet, bowel regimen with MiraLAX as needed PPx: Lovenox  Disposition: Tomorrow  Subjective:  Patient reports that she is short of breath even though she is satting 100% on room air.  She reports that she feels like she just cannot catch her breath and attributes it to her "sinus infection" and her excess fluid.  Patient reports she has gained over 20 pounds since her last admission.  She also is complaining of leg pain in bilateral lower extremities.  This occurs when she has edema.   Objective: Temp:  [97.7 F (36.5 C)-98.5 F (36.9 C)] 97.9 F (36.6 C) (03/02 1300) Pulse Rate:  [  79-105] 91 (03/02 1300) Resp:  [18-20] 20 (03/02 1300) BP: (117-162)/(65-87) 117/74 (03/02 1300) SpO2:  [97 %-100 %] 99 % (03/02 1300) Weight:  [161.5 kg] 161.5 kg (03/02 0113) Physical Exam: General: Sitting on the side of the bed  when I first evaluated her.  She is breathing rapid breaths and says that she is having a panic attack.  Oxygen saturation is 100% on room air Cardiovascular: Mild tachycardia, no murmur appreciated Respiratory: Mild increased work of breathing.  Decreased breath sounds at bilateral bases no wheeze appreciated Abdomen: Bowel sounds positive, soft Extremities: Patient has free of hyperpigmentation lateral aspect of left lower extremity with mild tenderness to palpation   Laboratory: Recent Labs  Lab 05/07/19 1100 05/07/19 2357 05/08/19 0520  WBC 9.7 10.1 9.6  HGB 8.6* 8.8* 8.2*  HCT 29.3* 29.8* 26.6*  PLT 375 383 349   Recent Labs  Lab 05/07/19 1100 05/07/19 2357 05/08/19 0520  NA 140  --  139  K 3.2*  --  3.2*  CL 108  --  109  CO2 22  --  21*  BUN 23*  --  25*  CREATININE 1.41* 1.37* 1.29*  CALCIUM 8.9  --  8.8*  GLUCOSE 154*  --  278*    Imaging/Diagnostic Tests: DG Chest Port 1 View  Result Date: 05/07/2019 CLINICAL DATA:  Onset respiratory distress and chest pain this morning. EXAM: PORTABLE CHEST 1 VIEW COMPARISON:  Single-view of the chest 03/15/2019 and PA and lateral chest 03/11/2019. FINDINGS: Heart size is upper normal in there is pulmonary vascular congestion. No consolidative process, pneumothorax or effusion. No acute or focal bony abnormality. IMPRESSION: Pulmonary vascular congestion. Electronically Signed   By: Inge Rise M.D.   On: 05/07/2019 15:29   Gifford Shave, MD 05/08/2019, 3:13 PM PGY-1, [place] Intern pager: [phone], text pages welcome

## 2019-05-09 DIAGNOSIS — K529 Noninfective gastroenteritis and colitis, unspecified: Secondary | ICD-10-CM

## 2019-05-09 LAB — BASIC METABOLIC PANEL
Anion gap: 12 (ref 5–15)
BUN: 22 mg/dL — ABNORMAL HIGH (ref 6–20)
CO2: 24 mmol/L (ref 22–32)
Calcium: 8.8 mg/dL — ABNORMAL LOW (ref 8.9–10.3)
Chloride: 102 mmol/L (ref 98–111)
Creatinine, Ser: 1.34 mg/dL — ABNORMAL HIGH (ref 0.44–1.00)
GFR calc Af Amer: 52 mL/min — ABNORMAL LOW (ref >60)
GFR calc non Af Amer: 44 mL/min — ABNORMAL LOW (ref >60)
Glucose, Bld: 323 mg/dL — ABNORMAL HIGH (ref 70–99)
Potassium: 3.4 mmol/L — ABNORMAL LOW (ref 3.5–5.1)
Sodium: 138 mmol/L (ref 135–145)

## 2019-05-09 LAB — GLUCOSE, CAPILLARY
Glucose-Capillary: 287 mg/dL — ABNORMAL HIGH (ref 70–99)
Glucose-Capillary: 275 mg/dL — ABNORMAL HIGH (ref 70–99)

## 2019-05-09 MED ORDER — POTASSIUM CHLORIDE CRYS ER 20 MEQ PO TBCR
40.0000 meq | EXTENDED_RELEASE_TABLET | Freq: Once | ORAL | Status: AC
  Administered 2019-05-09: 40 meq via ORAL
  Filled 2019-05-09: qty 2

## 2019-05-09 MED ORDER — TORSEMIDE 20 MG PO TABS: 40.0000 mg | ORAL_TABLET | Freq: Every day | ORAL | 0 refills | Status: DC

## 2019-05-09 MED ORDER — APIXABAN 5 MG PO TABS: 5.0000 mg | ORAL_TABLET | Freq: Two times a day (BID) | ORAL | 0 refills | Status: DC

## 2019-05-09 NOTE — Discharge Summary (Addendum)
[facility] Discharge Summary  Patient name: Deborah [name] Medical record number: [id number] Date of birth: 02-01-1966 Age: 54 y.o. Gender: female Date of Admission: 05/07/2019  Date of Discharge: 05/09/2019 Admitting Physician: Zenia Resides, MD  Primary Care Provider: [name], DO Consultants: None  Indication for Hospitalization: [name]  Discharge Diagnoses/Problem List:  Possible CHF exacerbation with history of HFrEF 40 to 45% Type 2 diabetes, hyperglycemia CKD stage III Hypertension Hyperlipidemia Chronic abdominal pain Hypokalemia Insomnia Gout Anxiety GERD Seasonal allergies  Disposition: Home  Discharge Condition: Stable  Discharge Exam:  Temp:  [97.7 F (36.5 C)-98.5 F (36.9 C)] 97.9 F (36.6 C) (03/02 1300) Pulse Rate:  [79-105] 91 (03/02 1300) Resp:  [18-20] 20 (03/02 1300) BP: (117-162)/(65-87) 117/74 (03/02 1300) SpO2:  [97 %-100 %] 99 % (03/02 1300) Weight:  [161.5 kg] 161.5 kg (03/02 0113) Physical Exam: General:  Sleeping in bed, no acute distress.    Laying on right side Cardiovascular: Mild tachycardia, no murmur appreciated Respiratory: Mild increased work of breathing.  Decreased breath sounds at bilateral bases no wheeze appreciated Abdomen: Bowel sounds positive, soft Extremities: Patient has free of hyperpigmentation lateral aspect of left lower extremity with mild tenderness to palpation  Brief Hospital Course:  Patient reported to the ED with subjective shortness of breath and leg swelling in the setting of being diagnosed with sinusitis.  She she reported that since her symptoms began she is felt worse and worse.  In the ED I had difficulty getting peripheral access and patient declined oral Lasix.  She was given supplemental oxygen chest x-ray was performed showing vascular congestion.  O2 sats remained 98 to 100% on room air but the patient continuously requested supplemental  oxygen.  CHF exacerbation Patient satted approximately 100% on room air but subjectively short of breath on admission.  BNP was 127.1, troponin was 40>36.  EKG showed no ischemic changes and sinus tachycardia.  Patient did have bilateral lower extremity edema and had 21 pound weight gain since her last evaluation.  Patient was complaining of the leg pain.  Patient was given 80 mg IV Lasix in the ED and responded well with large urine output.  On 3/2 patient was transitioned to torsemide 40 mg daily.  Patient continued to have adequate urine output and her shortness of breath and lower extremity edema decreased.  Patient also reported decrease and lower extremity pain.  Patient was evaluated by PT and OT who recommended outpatient physical therapy and home health OT.  Patient repeatedly asked for pain medication for her lower extremity pain but reported allergies to everything but opiates.  Patient declined Tylenol, Neurontin, topical gels or lidocaine patches.  On 3/3 patient felt like her shortness of breath had decreased in her lower extremity edema had also decreased.  She felt comfortable going home at that time.  At time of discharge vitals were stable and she was breathing comfortably on RA.   Issues for Follow Up:  1. Imodium as needed for diarrhea 2. Reglan for gastroparesis, do not prescribe Zofran 3. Transitioned to 40 mg torsemide 4. Repeat BMP at f/u appointment on 3/5   Significant Procedures: None  Significant Labs and Imaging:  Recent Labs  Lab 05/07/19 1100 05/07/19 2357 05/08/19 0520  WBC 9.7 10.1 9.6  HGB 8.6* 8.8* 8.2*  HCT 29.3* 29.8* 26.6*  PLT 375 383 349   Recent Labs  Lab 05/07/19 1100 05/07/19 1100 05/07/19 2357 05/08/19 0520 05/09/19 0957  NA 140  --   --  139 138  K 3.2*   < >  --  3.2* 3.4*  CL 108  --   --  109 102  CO2 22  --   --  21* 24  GLUCOSE 154*  --   --  278* 323*  BUN 23*  --   --  25* 22*  CREATININE 1.41*  --  1.37* 1.29* 1.34*  CALCIUM  8.9  --   --  8.8* 8.8*   < > = values in this interval not displayed.   BNP-127  Results/Tests Pending at Time of Discharge: None  Discharge Medications:  Allergies as of 05/09/2019      Reactions   Contrast Media [iodinated Diagnostic Agents] Anaphylaxis   Cardiac arrest   Diazepam [name]   Isovue [iopamidol] Anaphylaxis   Patient had seizure like activity and then code post 100 cc of isovue 300   Lisinopril Anaphylaxis   Tongue and mouth swelling   Penicillins Palpitations   Has patient had a PCN reaction causing immediate rash, facial/tongue/throat swelling, SOB or lightheadedness with hypotension: Yes, heart races Has patient had a PCN reaction causing severe rash involving mucus membranes or skin necrosis: No Has patient had a PCN reaction that required hospitalization: Yes  Has patient had a PCN reaction occurring within the last 10 years: No   Acetaminophen Nausea Only, Other (See Comments)   Irritates stomach ulcer  Abdominal pain   Tolmetin Nausea Only, Other (See Comments)   ULCER   Aspirin Other (See Comments)   Irritates stomach ulcer    Food Hives, Swelling   Reaction to Carrots, ketchup    Nsaids Other (See Comments)   ULCER   Tramadol Nausea And Vomiting      Medication List    STOP taking these medications   allopurinol 100 MG tablet Commonly known as: ZYLOPRIM   benzonatate 100 MG capsule Commonly known as: TESSALON   furosemide 40 MG tablet Commonly known as: LASIX   ondansetron 4 MG disintegrating tablet Commonly known as: Zofran ODT   Potassium Chloride ER 20 MEQ Tbcr     TAKE these medications   albuterol (2.5 MG/3ML) 0.083% nebulizer solution Commonly known as: PROVENTIL Take 3 mLs (2.5 mg total) by nebulization every 6 (six) hours as needed for wheezing or shortness of breath.   amLODipine 10 MG tablet Commonly known as: NORVASC Take 10 mg by mouth every morning.   apixaban 5 MG Tabs tablet Commonly known as:  ELIQUIS Take 1 tablet (5 mg total) by mouth 2 (two) times daily.   aspirin EC 81 MG tablet Take 81 mg by mouth daily.   atorvastatin 10 MG tablet Commonly known as: LIPITOR Take 1 tablet (10 mg total) by mouth daily.   blood glucose meter kit and supplies Kit Dispense based on patient and insurance preference. Use up to four times daily as directed. (FOR ICD-9 250.00, 250.01).   cetirizine 10 MG tablet Commonly known as: ZYRTEC Take 1 tablet (10 mg total) by mouth daily.   dicyclomine 20 MG tablet Commonly known as: BENTYL Take 1 tablet (20 mg total) by mouth 3 (three) times daily as needed for spasms (abdominal cramping).   DULoxetine HCl 40 MG Cpep Take 40 mg by mouth 2 (two) times daily.   fluticasone 50 MCG/ACT nasal spray Commonly known as: FLONASE Place 2 sprays into both nostrils daily as needed for allergies or rhinitis.   hydrALAZINE 25 MG tablet Commonly known as: APRESOLINE TAKE ONE TABLET BY  MOUTH THREE TIMES A DAY. FOR BLOOD PRESSURE What changed: See the new instructions.   ipratropium 0.03 % nasal spray Commonly known as: ATROVENT Place 2 sprays into both nostrils 2 (two) times daily.   isosorbide mononitrate 30 MG 24 hr tablet Commonly known as: IMDUR Take 0.5 tablets (15 mg total) by mouth daily.   Lantus SoloStar 100 UNIT/ML Solostar Pen Generic drug: insulin glargine Inject 30 Units into the skin at bedtime. What changed: how much to take   loperamide 2 MG capsule Commonly known as: IMODIUM Take 2 mg by mouth 4 (four) times daily as needed for diarrhea or loose stools.   Melatonin 3 MG Tabs Take 2 tablets (6 mg total) by mouth at bedtime.   metoCLOPramide 10 MG tablet Commonly known as: REGLAN Take 1 tablet (10 mg total) by mouth 3 (three) times daily before meals.   metoprolol succinate 50 MG 24 hr tablet Commonly known as: TOPROL-XL Take 1 tablet (50 mg total) by mouth daily. Take with or immediately following a meal.   nitroGLYCERIN  0.4 MG SL tablet Commonly known as: NITROSTAT Place 1 tablet (0.4 mg total) under the tongue every 5 (five) minutes as needed for chest pain.   NovoLOG FlexPen 100 UNIT/ML FlexPen Generic drug: insulin aspart Inject 30 Units into the skin 4 (four) times daily.   pantoprazole 40 MG tablet Commonly known as: PROTONIX Take 1 tablet (40 mg total) by mouth 2 (two) times daily.   torsemide 20 MG tablet Commonly known as: DEMADEX Take 2 tablets (40 mg total) by mouth daily.       Discharge Instructions: Please refer to Patient Instructions section of EMR for full details.  Patient was counseled important signs and symptoms that should prompt return to medical care, changes in medications, dietary instructions, activity restrictions, and follow up appointments.   Follow-Up Appointments: Follow-up Information    [place]. Go on 05/11/2019.   Why: at 1:50 PM Contact information: [place] Follow up.           Gifford Shave, MD 05/10/2019, 4:28 PM PGY-1, [facility] Upper-Level Resident Addendum    I have discussed the above with the original author and agree with their documentation. My edits for correction/addition/clarification have been made. Please see also any attending notes.   [place] Constable, D.O. PGY-2, [facility] 05/11/2019 3:59 PM

## 2019-05-09 NOTE — Progress Notes (Addendum)
Brief note. Will cosign resident's note once it is done   The patient was sleeping when I went in to see her this morning. However, as soon as she woke, she informed me that she had stomach pain with diarrhea all night. Her pain improved to 8/10 this morning, she was given Bentyl, and Gabapentin overnight, which she stated did not help a lot. She endorsed four episodes of vomitus, which she stated she reported to the nursing staff, although none of them saw the vomitus. She denies SOB today.  Exam: She flinched her eye when I pressed on her stomach. Otherwise, normal exam. Her physical exam was benign.   A/P:  Mild Systolic CHF exacerbation resolved S/P IV Lasix. Now on oral Torsemide. Monitor weight and kidney function closely while on meds. Outpatient f/u with PCP and Cards recommended.   DM2: Poorly controlled.  Need meds adjustment. She has PCP f/u in 2 days. She is currently on Lantus 40 units. Consider adding SGLT2 to her regimen if there is no contraindication to it.  Chronic diarrhea and abdominal pain. Resident went to evaluate her after I did and according to him, her symptoms are all resolved. She had work up done in the past for her chronic diarrhea and vomiting. ?? DM Gastroparesis. Continue home Imodium prn as well as Reglan for N/V. F/U with GI as planned.  PCP F/U for her other chronic problems.

## 2019-05-09 NOTE — Progress Notes (Signed)
PT Cancellation Note  Patient Details Name: Deborah [name] MRN: [id number] DOB: 01-17-1966   Cancelled Treatment:    Reason Eval/Treat Not Completed: Patient declined, no reason specified patient very polite but clear that she does not want to do therapy today as she is discharging this afternoon. Declines up to bathroom or OOB/EOB with PT.    Ann Lions PT, DPT, PN1   Supplemental Physical Therapist [place]    Pager [phone] Acute Rehab Office [phone]

## 2019-05-09 NOTE — Plan of Care (Signed)
  Problem: Education: Goal: Knowledge of General Education information will improve Description Including pain rating scale, medication(s)/side effects and non-pharmacologic comfort measures Outcome: Progressing   

## 2019-05-09 NOTE — Progress Notes (Signed)
Inpatient Diabetes Program Recommendations  AACE/ADA: New Consensus Statement on Inpatient Glycemic Control (2015)  Target Ranges:  Prepandial:   less than 140 mg/dL      Peak postprandial:   less than 180 mg/dL (1-2 hours)      Critically ill patients:  140 - 180 mg/dL   Lab Results  Component Value Date   GLUCAP 287 (H) 05/09/2019   HGBA1C 8.3 (H) 05/07/2019    Review of Glycemic Control Results for Deborah [name], Deborah [name] (MRN [id number]) as of 05/09/2019 08:34  Ref. Range 05/08/2019 07:02 05/08/2019 11:14 05/08/2019 16:13 05/08/2019 21:31 05/09/2019 07:06  Glucose-Capillary Latest Ref Range: 70 - 99 mg/dL 260 (H) 231 (H) 309 (H) 268 (H) 287 (H)   Diabetes history: DM 2 Outpatient Diabetes medications: Lantus 45 units qhs, Novolog 30 units 4x/day Current orders for Inpatient glycemic control: Lantus 40 units Novolog 0-15 units tid  Inpatient Diabetes Program Recommendations:    First dose of Lantus given last night. Fasting glucose in the 280's.   -  Consider increasing Lantus to home dose 45 units.  -  Add Novolog hs bedtime coverage scale.   Thanks,  Tama Headings RN, MSN, BC-ADM Inpatient Diabetes Coordinator Team Pager [phone] (8a-5p)

## 2019-05-09 NOTE — TOC Initial Note (Addendum)
Transition of Care [facility]) - Initial/Assessment Note    Patient Details  Name: Deborah [name] MRN: [id number] Date of Birth: 1965/05/17  [facility] CM/SW Contact:    Marilu Favre, RN Phone Number: 05/09/2019, 2:15 PM  Clinical Narrative:                 Spoke to patient at bedside. She is from home alone, however she is going to stay with her brother at discharge. His address is [address], [address] [place], [place] [address]   Offered choice for home health, patient prefers [place] called Butch Penny with [facility] and left message awaiting call back.   Patient has a wheel chair at home, does not want 3 in 1. Patient is requesting a Corporate investment banker, paged family medicine awaiting call back.   Received the order for walker, messaged [place] with [place]   Expected Discharge Plan: [place] Barriers to Discharge: No Barriers Identified   Patient Goals and CMS Choice Patient states their goals for this hospitalization and ongoing recovery are:: to go home CMS Medicare.gov Compare Post Acute Care list provided to:: Patient    Expected Discharge Plan and Services Expected Discharge Plan: [place]   Discharge Planning Services: CM Consult Post Acute Care Choice: Home Health, Durable Medical Equipment Living arrangements for the past 2 months: Apartment Expected Discharge Date: 05/09/19                         HH Arranged: OT, PT [place] Agency: [place] ([place]) Date [place]: 05/09/19 Time [place]: 1414 Representative spoke with at [place]: Left message for Butch Penny awaiting call back  Prior Living Arrangements/Services Living arrangements for the past 2 months: Apartment Lives with:: Siblings Patient language and need for interpreter reviewed:: Yes Do you feel safe going back to the place where you live?: Yes      Need for Family Participation in Patient Care: Yes  (Comment) Care giver support system in place?: Yes (comment) Current home services: DME Criminal Activity/Legal Involvement Pertinent to Current Situation/Hospitalization: No - Comment as needed  Activities of Daily Living Home Assistive Devices/Equipment: Wheelchair, CBG Meter ADL Screening (condition at time of admission) Patient's cognitive ability adequate to safely complete daily activities?: Yes Is the patient deaf or have difficulty hearing?: No Does the patient have difficulty seeing, even when wearing glasses/contacts?: No Does the patient have difficulty concentrating, remembering, or making decisions?: No Patient able to express need for assistance with ADLs?: Yes Does the patient have difficulty dressing or bathing?: Yes Independently performs ADLs?: Yes (appropriate for developmental age) Does the patient have difficulty walking or climbing stairs?: Yes Weakness of Legs: Both Weakness of Arms/Hands: None  Permission Sought/Granted   Permission granted to share information with : No              Emotional Assessment Appearance:: Appears stated age Attitude/Demeanor/Rapport: Engaged Affect (typically observed): Accepting Orientation: : Oriented to Self, Oriented to Place, Oriented to  Time, Oriented to Situation Alcohol / Substance Use: Not Applicable Psych Involvement: No (comment)  Admission diagnosis:  [name] [R06.02] SOB (shortness of breath) [R06.02] COPD exacerbation (HCC) [J44.1] Patient Active Problem List   Diagnosis Date Noted  . Chronic diarrhea   . COPD exacerbation ([place]) 05/08/2019  . Acute on chronic HFrEF (heart failure with reduced ejection fraction) ([place])   . High risk social  situation   . Restrictive lung disease secondary to obesity   . Physical deconditioning   . [name] 05/07/2019  . Flu-like symptoms 04/05/2019  . Cough   . Acute on chronic congestive heart failure ([place])   . History of gastric ulcer   .  Uncontrolled type 2 diabetes mellitus with hyperglycemia ([place])   . Fibroid uterus 02/23/2019  . Congestion of nasal sinus 01/24/2019  . Chronic diastolic heart failure ([place]) 12/19/2018  . Intractable nausea and vomiting 12/17/2018  . Hypoxia 12/17/2018  . Hyperglycemia 12/17/2018  . Elevated troponin I level   . Vomiting 07/18/2018  . Abdominal pain 07/17/2018  . Hyperkalemia 07/17/2018  . Cardiac arrest ([place]) 11/29/2017  . Pelvic mass 11/29/2017  . Leukocytosis 11/29/2017  . Anxiety 11/29/2017  . Allergic reaction to contrast media, initial encounter 11/28/2017  . Palliative care encounter   . Back pain 03/19/2017  . Stroke (cerebrum) ([place]) 03/19/2017  . GERD (gastroesophageal reflux disease) 03/19/2017  . Depression 03/19/2017  . Morbid obesity ([place])   . Urinary tract infection 08/16/2016  . Normocytic normochromic anemia 08/16/2016  . Gastroparesis 08/16/2016  . Intractable vomiting with nausea 06/17/2016  . Diabetic gastroparesis ([place]) 06/05/2016  . Gout 06/05/2016  . AKI (acute kidney injury) ([place]) 12/06/2015  . Chest pain 09/26/2015  . Hypokalemia 09/26/2015  . Hypomagnesemia 09/26/2015  . Nausea and vomiting 08/20/2015  . Gout flare 05/27/2015  . Lower abdominal pain 05/26/2015  . DKA (diabetic ketoacidoses) ([place]) 05/25/2015  . Uncontrolled type 2 diabetes mellitus with diabetic neuropathy, with long-term current use of insulin ([place]) 05/25/2015  . Type 2 diabetes mellitus with hyperglycemia, with long-term current use of insulin ([place]) 05/25/2015  . CKD (chronic kidney disease), stage II 05/25/2015  . Essential hypertension, benign 09/28/2013   PCP:  [name], DO Pharmacy:   [place], [place] - [address] [place] [address] Phone: [phone] Fax: [phone]  [facility] [place], [place] - [address] [place] [address] Phone:  [phone] Fax: [phone]     Social Determinants of Health (SDOH) Interventions    Readmission Risk Interventions Readmission Risk Prevention Plan 01/29/2019 12/19/2018  Transportation Screening Complete Complete  PCP or Specialist Appt within 3-5 Days Complete Not Complete  Not Complete comments - Left a message asking for appointment within 5-7 days  [place] or [place] Complete Not Complete  Social Work Consult for [place] Planning/Counseling Complete Not Complete  Palliative Care Screening Not Applicable Not Applicable  Medication Review (RN Care Manager) Complete Referral to Pharmacy  Some recent data might be hidden

## 2019-05-09 NOTE — Progress Notes (Signed)
Deborah [name] to be discharged Home per MD order. Discussed prescriptions and follow up appointments with the patient. Prescriptions given to patient; medication list explained in detail. Patient verbalized understanding.  Skin clean, dry and intact without evidence of skin break down, no evidence of skin tears noted. IV catheter discontinued intact. Site without signs and symptoms of complications. Dressing and pressure applied. Pt denies pain at the site currently. No complaints noted.  Patient free of lines, drains, and wounds.   An After Visit Summary (AVS) was printed and given to the patient. Patient escorted via wheelchair, and discharged home via private auto.  Shela Commons, RN

## 2019-05-09 NOTE — Discharge Instructions (Signed)
You were admitted for possible CHF exacerbation.  We would like for you to stop taking your Lasix because we have switched you to torsemide 40 mg daily.  Regarding your other issues we would like you to continue to use Imodium as needed for diarrhea.  We also noticed that you are prescribed Reglan for gastroparesis. You can continue this but do not take the Zofran that you are prescribed.  With regards to your blood thinner, you were prescribed Eliquis in the past but you discontinued it because you were concerned about clotting.  We would like for you to continue taking that medication.  We have sent a new prescription to your pharmacy.  I would like free to follow-up with your primary provider.  I have scheduled you appointment for Friday at 1:50 PM with Dr. Higinio Plan.   Heart Failure, Self Care Heart failure is a serious condition. This sheet explains things you need to do to take care of yourself at home. To help you stay as healthy as possible, you may be asked to change your diet, take certain medicines, and make other changes in your life. Your doctor may also give you more specific instructions. If you have problems or questions, call your doctor. What are the risks? Having heart failure makes it more likely for you to have some problems. These problems can get worse if you do not take good care of yourself. Problems may include:  Blood clotting problems. This may cause a stroke.  Damage to the kidneys, liver, or lungs.  Abnormal heart rhythms. Supplies needed:  Scale for weighing yourself.  Blood pressure monitor.  Notebook.  Medicines. How to care for yourself when you have heart failure Medicines Take over-the-counter and prescription medicines only as told by your doctor. Take your medicines every day.  Do not stop taking your medicine unless your doctor tells you to do so.  Do not skip any medicines.  Get your prescriptions refilled before you run out of medicine. This is  important. Eating and drinking   Eat heart-healthy foods. Talk with a diet specialist (dietitian) to create an eating plan.  Choose foods that: ? Have no trans fat. ? Are low in saturated fat and cholesterol.  Choose healthy foods, such as: ? Fresh or frozen fruits and vegetables. ? Fish. ? Low-fat (lean) meats. ? Legumes, such as beans, peas, and lentils. ? Fat-free or low-fat dairy products. ? Whole-grain foods. ? High-fiber foods.  Limit salt (sodium) if told by your doctor. Ask your diet specialist to tell you which seasonings are healthy for your heart.  Cook in healthy ways instead of frying. Healthy ways of cooking include roasting, grilling, broiling, baking, poaching, steaming, and stir-frying.  Limit how much fluid you drink, if told by your doctor. Alcohol use  Do not drink alcohol if: ? Your doctor tells you not to drink. ? Your heart was damaged by alcohol, or you have very bad heart failure. ? You are pregnant, may be pregnant, or are planning to become pregnant.  If you drink alcohol: ? Limit how much you use to:  0-1 drink a day for women.  0-2 drinks a day for men. ? Be aware of how much alcohol is in your drink. In the [place] one drink equals one 12 oz bottle of beer (355 mL), one 5 oz glass of wine (148 mL), or one 1 oz glass of hard liquor (44 mL). Lifestyle   Do not use any products that contain nicotine or  tobacco, such as cigarettes, e-cigarettes, and chewing tobacco. If you need help quitting, ask your doctor. ? Do not use nicotine gum or patches before talking to your doctor.  Do not use illegal drugs.  Lose weight if told by your doctor.  Do physical activity if told by your doctor. Talk to your doctor before you begin an exercise if: ? You are an older adult. ? You have very bad heart failure.  Learn to manage stress. If you need help, ask your doctor.  Get rehab (rehabilitation) to help you stay independent and to help with your  quality of life.  Plan time to rest when you get tired. Check weight and blood pressure   Weigh yourself every day. This will help you to know if fluid is building up in your body. ? Weigh yourself every morning after you pee (urinate) and before you eat breakfast. ? Wear the same amount of clothing each time. ? Write down your daily weight. Give your record to your doctor.  Check and write down your blood pressure as told by your doctor.  Check your pulse as told by your doctor. Dealing with very hot and very cold weather  If it is very hot: ? Avoid activities that take a lot of energy. ? Use air conditioning or fans, or find a cooler place. ? Avoid caffeine and alcohol. ? Wear clothing that is loose-fitting, lightweight, and light-colored.  If it is very cold: ? Avoid activities that take a lot of energy. ? Layer your clothes. ? Wear mittens or gloves, a hat, and a scarf when you go outside. ? Avoid alcohol. Follow these instructions at home:  Stay up to date with shots (vaccines). Get pneumococcal and flu (influenza) shots.  Keep all follow-up visits as told by your doctor. This is important. Contact a doctor if:  You gain weight quickly.  You have increasing shortness of breath.  You cannot do your normal activities.  You get tired easily.  You cough a lot.  You don't feel like eating or feel like you may vomit (nauseous).  You become puffy (swell) in your hands, feet, ankles, or belly (abdomen).  You cannot sleep well because it is hard to breathe.  You feel like your heart is beating fast (palpitations).  You get dizzy when you stand up. Get help right away if:  You have trouble breathing.  You or someone else notices a change in your behavior, such as having trouble staying awake.  You have chest pain or discomfort.  You pass out (faint). These symptoms may be an emergency. Do not wait to see if the symptoms will go away. Get medical help right  away. Call your local emergency services (911 in the [place] Do not drive yourself to the hospital. Summary  Heart failure is a serious condition. To care for yourself, you may have to change your diet, take medicines, and make other lifestyle changes.  Take your medicines every day. Do not stop taking them unless your doctor tells you to do so.  Eat heart-healthy foods, such as fresh or frozen fruits and vegetables, fish, lean meats, legumes, fat-free or low-fat dairy products, and whole-grain or high-fiber foods.  Ask your doctor if you can drink alcohol. You may have to stop alcohol use if you have very bad heart failure.  Contact your doctor if you gain weight quickly or feel that your heart is beating too fast. Get help right away if you pass out, or have  chest pain or trouble breathing. This information is not intended to replace advice given to you by your health care provider. Make sure you discuss any questions you have with your health care provider. Document Revised: 06/06/2018 Document Reviewed: 06/07/2018 Elsevier Patient Education  [place].

## 2019-05-11 ENCOUNTER — Telehealth: Payer: Self-pay

## 2019-05-11 ENCOUNTER — Inpatient Hospital Stay: Payer: Medicare Other | Admitting: Family Medicine

## 2019-05-11 NOTE — Telephone Encounter (Signed)
Deborah [name], home health PT, LVM on nurse line requesting VO to change start of care for patient. Start of care will now be 03/08, instead of today.   Verbal given.

## 2019-05-17 ENCOUNTER — Telehealth: Payer: Self-pay

## 2019-05-17 DIAGNOSIS — I5023 Acute on chronic systolic (congestive) heart failure: Secondary | ICD-10-CM

## 2019-05-17 NOTE — Telephone Encounter (Signed)
It is OK with me to cancel the OT evaluation for now.  I did put in a new referral to chronic care management with the hope of helping her with better outpatient follow up adherence.

## 2019-05-17 NOTE — Telephone Encounter (Signed)
Jerilyn form [place] called. Pt had an appt with OT on Monday. Pt called 1 hour before and cancelled because she said she was in the hospital. There is no record of her being at the hospital. Rachel Moulds has called the pt several times and LVM. Pt has not returned her call. Should they cancel the evaluation appt?  Please advise on what you would like them to do. Rachel Moulds can be reached at [phone]. Ottis Stain, CMA

## 2019-05-18 NOTE — Telephone Encounter (Signed)
Deborah [name] has been informed of the information below. OT will be cancelled. Ottis Stain, CMA

## 2019-05-22 ENCOUNTER — Ambulatory Visit: Payer: Self-pay | Admitting: Licensed Clinical Social Worker

## 2019-05-22 NOTE — Chronic Care Management (AMB) (Signed)
    Clinical Social Work  Care Management Outreach   05/22/2019 Name: Deborah [name] MRN: [id number] DOB: [birth year]  Deborah [name] is a 54 y.o. year old female who is a primary care patient of [name], DO .  The Care Management team was consulted for assistance with managing chronic care and assessment of barriers to care.   LCSW reached out to [facility] today by phone to introduce self, assess needs. Discussed importance of having care managed by PCP to avoid ER visits.  Patient understands and states if she can't breath she will go to ER.  Patient denies any transportation barriers or concerns to medical appointments.   Unable to provide patient with information about Care Management services today including: However will discuss after patient has appointment with PCP.  Intervention: Patient is willing to schedule appointment with PCP.  LCSW collaborated with front office to contact patient and schedule appointment and with RN care Freight forwarder ref interventions for managing chronic conditions   Review of patient status, including review of consultants reports, relevant laboratory and other test results, and collaboration with appropriate care team members and the patient's provider was performed as part of comprehensive patient evaluation and provision of care management services.    Follow Up Plan:  CCM care team member will reach out to patient after she has appointment with PCP and he discussed CCM services with patient.   [name], LCSW Clinical Social Worker [place] / [place]   [phone] 2:38 PM

## 2019-05-31 MED ORDER — METOPROLOL SUCCINATE ER 25 MG PO TB24
25.00 | ORAL_TABLET | ORAL | Status: DC
Start: 2019-06-01 — End: 2019-05-31

## 2019-05-31 MED ORDER — GLUCOSE 40 % PO GEL
15.00 | ORAL | Status: DC
Start: ? — End: 2019-05-31

## 2019-05-31 MED ORDER — INSULIN LISPRO 100 UNIT/ML ~~LOC~~ SOLN
2.00 | SUBCUTANEOUS | Status: DC
Start: 2019-05-31 — End: 2019-05-31

## 2019-05-31 MED ORDER — APIXABAN 5 MG PO TABS
5.00 | ORAL_TABLET | ORAL | Status: DC
Start: 2019-05-31 — End: 2019-05-31

## 2019-05-31 MED ORDER — NITROGLYCERIN 0.4 MG SL SUBL
0.40 | SUBLINGUAL_TABLET | SUBLINGUAL | Status: DC
Start: ? — End: 2019-05-31

## 2019-05-31 MED ORDER — MAGNESIUM OXIDE 400 MG PO TABS
400.00 | ORAL_TABLET | ORAL | Status: DC
Start: 2019-05-31 — End: 2019-05-31

## 2019-05-31 MED ORDER — ATORVASTATIN CALCIUM 10 MG PO TABS
10.00 | ORAL_TABLET | ORAL | Status: DC
Start: 2019-05-31 — End: 2019-05-31

## 2019-05-31 MED ORDER — TORSEMIDE 10 MG PO TABS
40.00 | ORAL_TABLET | ORAL | Status: DC
Start: 2019-06-01 — End: 2019-05-31

## 2019-05-31 MED ORDER — PANTOPRAZOLE SODIUM 40 MG PO TBEC
40.00 | DELAYED_RELEASE_TABLET | ORAL | Status: DC
Start: 2019-05-31 — End: 2019-05-31

## 2019-05-31 MED ORDER — DEXTROSE 10 % IV SOLN
125.00 | INTRAVENOUS | Status: DC
Start: ? — End: 2019-05-31

## 2019-05-31 MED ORDER — GLUCAGON (RDNA) 1 MG IJ KIT
1.00 | PACK | INTRAMUSCULAR | Status: DC
Start: ? — End: 2019-05-31

## 2019-05-31 MED ORDER — DICYCLOMINE HCL 20 MG PO TABS
20.00 | ORAL_TABLET | ORAL | Status: DC
Start: 2019-05-31 — End: 2019-05-31

## 2019-05-31 MED ORDER — AMLODIPINE BESYLATE 5 MG PO TABS
10.00 | ORAL_TABLET | ORAL | Status: DC
Start: 2019-06-01 — End: 2019-05-31

## 2019-05-31 MED ORDER — TEMAZEPAM 15 MG PO CAPS
15.00 | ORAL_CAPSULE | ORAL | Status: DC
Start: ? — End: 2019-05-31

## 2019-05-31 MED ORDER — ALLOPURINOL 100 MG PO TABS
100.00 | ORAL_TABLET | ORAL | Status: DC
Start: 2019-06-01 — End: 2019-05-31

## 2019-05-31 MED ORDER — METHOCARBAMOL 500 MG PO TABS
500.00 | ORAL_TABLET | ORAL | Status: DC
Start: 2019-05-31 — End: 2019-05-31

## 2019-05-31 MED ORDER — DULOXETINE HCL 30 MG PO CPEP
60.00 | ORAL_CAPSULE | ORAL | Status: DC
Start: 2019-06-01 — End: 2019-05-31

## 2019-05-31 MED ORDER — ACETAMINOPHEN 325 MG PO TABS
650.00 | ORAL_TABLET | ORAL | Status: DC
Start: ? — End: 2019-05-31

## 2019-05-31 MED ORDER — ONDANSETRON 4 MG PO TBDP
4.00 | ORAL_TABLET | ORAL | Status: DC
Start: 2019-05-31 — End: 2019-05-31

## 2019-05-31 MED ORDER — INSULIN GLARGINE 100 UNIT/ML ~~LOC~~ SOLN
40.00 | SUBCUTANEOUS | Status: DC
Start: 2019-06-01 — End: 2019-05-31

## 2019-05-31 MED ORDER — ALPRAZOLAM 0.25 MG PO TABS
0.25 | ORAL_TABLET | ORAL | Status: DC
Start: ? — End: 2019-05-31

## 2019-06-01 ENCOUNTER — Other Ambulatory Visit: Payer: Self-pay | Admitting: Family Medicine

## 2019-06-04 ENCOUNTER — Other Ambulatory Visit: Payer: Self-pay

## 2019-06-04 ENCOUNTER — Ambulatory Visit (INDEPENDENT_AMBULATORY_CARE_PROVIDER_SITE_OTHER): Payer: Medicare Other | Admitting: Family Medicine

## 2019-06-04 VITALS — BP 142/80 | HR 110 | Ht 66.0 in | Wt 332.0 lb

## 2019-06-04 DIAGNOSIS — R112 Nausea with vomiting, unspecified: Secondary | ICD-10-CM

## 2019-06-04 DIAGNOSIS — K3184 Gastroparesis: Secondary | ICD-10-CM | POA: Diagnosis not present

## 2019-06-04 DIAGNOSIS — I10 Essential (primary) hypertension: Secondary | ICD-10-CM

## 2019-06-04 DIAGNOSIS — I5032 Chronic diastolic (congestive) heart failure: Secondary | ICD-10-CM | POA: Diagnosis not present

## 2019-06-04 DIAGNOSIS — E1165 Type 2 diabetes mellitus with hyperglycemia: Secondary | ICD-10-CM

## 2019-06-04 DIAGNOSIS — K760 Fatty (change of) liver, not elsewhere classified: Secondary | ICD-10-CM

## 2019-06-04 MED ORDER — TORSEMIDE 20 MG PO TABS: 40.0000 mg | ORAL_TABLET | Freq: Every day | ORAL | 2 refills | Status: DC

## 2019-06-04 NOTE — Progress Notes (Signed)
SUBJECTIVE:   CHIEF COMPLAINT / HPI:   Hospital Follow Up for N/A thought to be due to Gastroparesis Patient was recently hospitalized at [facility] due to extensive N/V. She was admitted after being found to have an AKI with electrolyte abnormalities thought secondary to dehydration. It was thought that her gastroparesis was the cause of her N/V. It is better today and she is on a mostly liquid diet. She did try some baked chicken yesterday and tolerated it well. Her only other complaint is that lately she has felt SOB only on going outside. When inside and moving she does not have SOB. She is taking Lantus 50U at bedtime and Novolog 30U with meals TID. It was recommended she has a GI outpatient gastric emptying study.  T2DM She is taking Lantus 50U at bedtime and Novolog 30U with meals TID. Her A1c is not to goal but improved from previous readings. She is trying to get better control with more regular insulin use and diet modification.  Concern for MAFLD While in the hospital concern was raised about her liver. I am unable to find images from this hospitalization but reading through the discharge summary in care everywhere the hospitalist recommended she have further workup with GI for metabolic associated liver fatty liver disease.   HTN Slightly above goal today with SBP of 142/80 however she has been having N/V lately which is just now improving. Suspect she hasn't been able to take her medications regularly and now is just able to tolerate oral meds again.   Of note her PHQ-2 was marked as 3 x 2 however patient was calm, normal behavior, and in no distress.  PERTINENT  PMH / PSH: HTN, T2DM, Obesity, Anxiety, Depression  OBJECTIVE:   BP (!) 142/80   Pulse (!) 110   Ht 5\' 6"  (1.676 m)   Wt (!) 332 lb (150.6 kg)   LMP 10/10/2012   SpO2 96%   BMI 53.59 kg/m   Gen: Alert and Oriented x 3, NAD CV: RRR, no murmurs, normal S1, S2 split Resp: CTAB, no wheezing, rales, or rhonchi,  comfortable work of breathing Ext: no clubbing, cyanosis, +1 bilateral edema Neuro: No gross deficits Skin: warm, dry, intact, no rashes  ASSESSMENT/PLAN:   Nonalcoholic fatty liver disease Unable to see labs and images from her most recent hospitalization due to the fact that it was at [facility] however after reading her discharge summary in care everywhere concern was raised about MAFLD.  - Referral to GI already made for gastroparesis and for possible gastric emptying study.   Nausea and vomiting Acute, improved. Thought to be due to gastroparesis. Advance diet as tolerated - Educated patient on diet that will help minimize her symptoms (gastroparesis diet) and to eat smaller portions and take breaks between meals but can eat more frequently. - Considered starting erythromycin but given her extensive medication list I would like to avoid adding additional medication. If she fails dietary changes then I would consider starting on erythromycin. - Referral to GI for gastric emptying study  Chronic diastolic heart failure [facility]) Patient states she has not heard from Cardiology - Replaced referral - Recently changed from Lasix to Torsemide; Cont Metoprolol  Essential hypertension, benign Slightly above goal today but due to recent illness will wait to change management. Was well controlled on previous regimen. - Cont Metoprolol  - Cont Hydralazine - Cont torsemide - Rechecking BMP today that did show elevated creatnine. Will call patient and let her  know to return for lab appointment in 1 week     [name], [place]

## 2019-06-04 NOTE — Patient Instructions (Signed)
Gastroparesis  Gastroparesis is a condition in which food takes longer than normal to empty from the stomach. The condition is usually long-lasting (chronic). It may also be called delayed gastric emptying. There is no cure, but there are treatments and things that you can do at home to help relieve symptoms. Treating the underlying condition that causes gastroparesis can also help relieve symptoms. What are the causes? In many cases, the cause of this condition is not known. Possible causes include:  A hormone (endocrine) disorder, such as hypothyroidism or diabetes.  A nervous system disease, such as Parkinson's disease or multiple sclerosis.  Cancer, infection, or surgery that affects the stomach or vagus nerve. The vagus nerve runs from your chest, through your neck, to the lower part of your brain.  A connective tissue disorder, such as scleroderma.  Certain medicines. What increases the risk? You are more likely to develop this condition if you:  Have certain disorders or diseases, including: ? An endocrine disorder. ? An eating disorder. ? Amyloidosis. ? Scleroderma. ? Parkinson's disease. ? Multiple sclerosis. ? Cancer or infection of the stomach or the vagus nerve.  Have had surgery on the stomach or vagus nerve.  Take certain medicines.  Are female. What are the signs or symptoms? Symptoms of this condition include:  Feeling full after eating very little.  Nausea.  Vomiting.  Heartburn.  Abdominal bloating.  Inconsistent blood sugar (glucose) levels on blood tests.  Lack of appetite.  Weight loss.  Acid from the stomach coming up into the esophagus (gastroesophageal reflux).  Sudden tightening (spasm) of the stomach, which can be painful. Symptoms may come and go. Some people may not notice any symptoms. How is this diagnosed? This condition is diagnosed with tests, such as:  Tests that check how long it takes food to move through the stomach and  intestines. These tests include: ? Upper gastrointestinal (GI) series. For this test, you drink a liquid that shows up well on X-rays, and then X-rays will be taken of your intestines. ? Gastric emptying scintigraphy. For this test, you eat food that contains a small amount of radioactive material, and then scans are taken. ? Wireless capsule GI monitoring system. For this test, you swallow a pill (capsule) that records information about how foods and fluid move through your stomach.  Gastric manometry. For this test, a tube is passed down your throat and into your stomach to measure electrical and muscular activity.  Endoscopy. For this test, a long, thin tube is passed down your throat and into your stomach to check for problems in your stomach lining.  Ultrasound. This test uses sound waves to create images of inside the body. This can help rule out gallbladder disease or pancreatitis as a cause of your symptoms. How is this treated? There is no cure for gastroparesis. Treatment may include:  Treating the underlying cause.  Managing your symptoms by making changes to your diet and exercise habits.  Taking medicines to control nausea and vomiting and to stimulate stomach muscles.  Getting food through a feeding tube in the hospital. This may be done in severe cases.  Having surgery to insert a device into your body that helps improve stomach emptying and control nausea and vomiting (gastric neurostimulator). Follow these instructions at home:  Take over-the-counter and prescription medicines only as told by your health care provider.  Follow instructions from your health care provider about eating or drinking restrictions. Your health care provider may recommend that you: ? Eat   smaller meals more often. ? Eat low-fat foods. ? Eat low-fiber forms of high-fiber foods. For example, eat cooked vegetables instead of raw vegetables. ? Have only liquid foods instead of solid foods. Liquid  foods are easier to digest.  Drink enough fluid to keep your urine pale yellow.  Exercise as often as told by your health care provider.  Keep all follow-up visits as told by your health care provider. This is important. Contact a health care provider if you:  Notice that your symptoms do not improve with treatment.  Have new symptoms. Get help right away if you:  Have severe abdominal pain that does not improve with treatment.  Have nausea that is severe or does not go away.  Cannot drink fluids without vomiting. Summary  Gastroparesis is a chronic condition in which food takes longer than normal to empty from the stomach.  Symptoms include nausea, vomiting, heartburn, abdominal bloating, and loss of appetite.  Eating smaller portions, and low-fat, low-fiber foods may help you manage your symptoms.  Get help right away if you have severe abdominal pain. This information is not intended to replace advice given to you by your health care provider. Make sure you discuss any questions you have with your health care provider. Document Revised: 05/23/2017 Document Reviewed: 12/28/2016 Elsevier Patient Education  2020 Elsevier Inc.  

## 2019-06-05 ENCOUNTER — Telehealth: Payer: Self-pay | Admitting: Family Medicine

## 2019-06-05 DIAGNOSIS — K760 Fatty (change of) liver, not elsewhere classified: Secondary | ICD-10-CM | POA: Insufficient documentation

## 2019-06-05 DIAGNOSIS — K7581 Nonalcoholic steatohepatitis (NASH): Secondary | ICD-10-CM | POA: Insufficient documentation

## 2019-06-05 LAB — BASIC METABOLIC PANEL
BUN/Creatinine Ratio: 9 (ref 9–23)
BUN: 18 mg/dL (ref 6–24)
CO2: 22 mmol/L (ref 20–29)
Calcium: 8.8 mg/dL (ref 8.7–10.2)
Chloride: 93 mmol/L — ABNORMAL LOW (ref 96–106)
Creatinine, Ser: 1.93 mg/dL — ABNORMAL HIGH (ref 0.57–1.00)
GFR calc Af Amer: 33 mL/min/{1.73_m2} — ABNORMAL LOW (ref >59)
GFR calc non Af Amer: 29 mL/min/{1.73_m2} — ABNORMAL LOW (ref >59)
Glucose: 324 mg/dL — ABNORMAL HIGH (ref 65–99)
Potassium: 2.9 mmol/L — ABNORMAL LOW (ref 3.5–5.2)
Sodium: 136 mmol/L (ref 134–144)

## 2019-06-05 LAB — MAGNESIUM: Magnesium: 1.2 mg/dL — ABNORMAL LOW (ref 1.6–2.3)

## 2019-06-05 NOTE — Assessment & Plan Note (Signed)
Unable to see labs and images from her most recent hospitalization due to the fact that it was at [facility] however after reading her discharge summary in care everywhere concern was raised about MAFLD.  - Referral to GI already made for gastroparesis and for possible gastric emptying study.

## 2019-06-05 NOTE — Chronic Care Management (AMB) (Signed)
  Chronic Care Management   Outreach Note  06/05/2019 Name: Deborah [name] MRN: [id number] DOB: 1965-04-23  Deborah [name] is a 54 y.o. year old female who is a primary care patient of [name], DO. I reached out to Deborah [name] by phone today in response to a referral sent by Ms. Beatrix M Stemen's PCP, [name] DO     An unsuccessful telephone outreach was attempted today. The patient was referred to the case management team for assistance with care management and care coordination.   Follow Up Plan: A HIPPA compliant phone message was left for the patient providing contact information and requesting a return call.  The care management team will reach out to the patient again over the next 7 days.  If patient returns call to provider office, please advise to call Embedded Care Management Care Guide Glenna Durand LPN at [phone]  Graci Hulce, LPN Health Advisor, [place] [facility] ??[email or web address] ??[phone]

## 2019-06-05 NOTE — Assessment & Plan Note (Signed)
Acute, improved. Thought to be due to gastroparesis. Advance diet as tolerated - Educated patient on diet that will help minimize her symptoms (gastroparesis diet) and to eat smaller portions and take breaks between meals but can eat more frequently. - Considered starting erythromycin but given her extensive medication list I would like to avoid adding additional medication. If she fails dietary changes then I would consider starting on erythromycin. - Referral to GI for gastric emptying study

## 2019-06-05 NOTE — Assessment & Plan Note (Signed)
Slightly above goal today but due to recent illness will wait to change management. Was well controlled on previous regimen. - Cont Metoprolol  - Cont Hydralazine - Cont torsemide - Rechecking BMP today that did show elevated creatnine. Will call patient and let her know to return for lab appointment in 1 week

## 2019-06-05 NOTE — Assessment & Plan Note (Signed)
Patient states she has not heard from Cardiology - Replaced referral - Recently changed from Lasix to Torsemide; Cont Metoprolol

## 2019-06-07 ENCOUNTER — Other Ambulatory Visit: Payer: Self-pay | Admitting: Family Medicine

## 2019-06-07 DIAGNOSIS — I10 Essential (primary) hypertension: Secondary | ICD-10-CM

## 2019-06-07 MED ORDER — ONDANSETRON HCL 4 MG/2ML IJ SOLN
4.00 | INTRAMUSCULAR | Status: DC
Start: ? — End: 2019-06-07

## 2019-06-07 MED ORDER — INSULIN LISPRO 100 UNIT/ML ~~LOC~~ SOLN
2.00 | SUBCUTANEOUS | Status: DC
Start: 2019-06-07 — End: 2019-06-07

## 2019-06-07 MED ORDER — MAGNESIUM OXIDE 400 MG PO TABS
400.00 | ORAL_TABLET | ORAL | Status: DC
Start: 2019-06-07 — End: 2019-06-07

## 2019-06-07 MED ORDER — ATORVASTATIN CALCIUM 10 MG PO TABS
10.00 | ORAL_TABLET | ORAL | Status: DC
Start: 2019-06-07 — End: 2019-06-07

## 2019-06-07 MED ORDER — HYDRALAZINE HCL 10 MG PO TABS
10.00 | ORAL_TABLET | ORAL | Status: DC
Start: ? — End: 2019-06-07

## 2019-06-07 MED ORDER — ALPRAZOLAM 0.25 MG PO TABS
0.25 | ORAL_TABLET | ORAL | Status: DC
Start: ? — End: 2019-06-07

## 2019-06-07 MED ORDER — METHOCARBAMOL 500 MG PO TABS
500.00 | ORAL_TABLET | ORAL | Status: DC
Start: 2019-06-07 — End: 2019-06-07

## 2019-06-07 MED ORDER — LOPERAMIDE HCL 2 MG PO CAPS
2.00 | ORAL_CAPSULE | ORAL | Status: DC
Start: ? — End: 2019-06-07

## 2019-06-07 MED ORDER — MORPHINE SULFATE 2 MG/ML IJ SOLN
1.00 | INTRAMUSCULAR | Status: DC
Start: ? — End: 2019-06-07

## 2019-06-07 MED ORDER — POTASSIUM CHLORIDE IN NACL 20-0.9 MEQ/L-% IV SOLN
INTRAVENOUS | Status: DC
Start: ? — End: 2019-06-07

## 2019-06-07 MED ORDER — AMLODIPINE BESYLATE 5 MG PO TABS
10.00 | ORAL_TABLET | ORAL | Status: DC
Start: 2019-06-08 — End: 2019-06-07

## 2019-06-07 MED ORDER — TRAMADOL HCL 50 MG PO TABS
50.00 | ORAL_TABLET | ORAL | Status: DC
Start: ? — End: 2019-06-07

## 2019-06-07 MED ORDER — METOPROLOL SUCCINATE ER 50 MG PO TB24
50.00 | ORAL_TABLET | ORAL | Status: DC
Start: 2019-06-08 — End: 2019-06-07

## 2019-06-07 MED ORDER — ALBUTEROL SULFATE (2.5 MG/3ML) 0.083% IN NEBU
2.50 | INHALATION_SOLUTION | RESPIRATORY_TRACT | Status: DC
Start: ? — End: 2019-06-07

## 2019-06-07 MED ORDER — CETIRIZINE HCL 10 MG PO TABS
10.00 | ORAL_TABLET | ORAL | Status: DC
Start: 2019-06-08 — End: 2019-06-07

## 2019-06-07 MED ORDER — DULOXETINE HCL 30 MG PO CPEP
60.00 | ORAL_CAPSULE | ORAL | Status: DC
Start: 2019-06-08 — End: 2019-06-07

## 2019-06-07 MED ORDER — PANTOPRAZOLE SODIUM 40 MG PO TBEC
40.00 | DELAYED_RELEASE_TABLET | ORAL | Status: DC
Start: 2019-06-07 — End: 2019-06-07

## 2019-06-07 MED ORDER — GLUCAGON (RDNA) 1 MG IJ KIT
1.00 | PACK | INTRAMUSCULAR | Status: DC
Start: ? — End: 2019-06-07

## 2019-06-07 MED ORDER — DICYCLOMINE HCL 20 MG PO TABS
20.00 | ORAL_TABLET | ORAL | Status: DC
Start: ? — End: 2019-06-07

## 2019-06-07 MED ORDER — INSULIN GLARGINE 100 UNIT/ML ~~LOC~~ SOLN
35.00 | SUBCUTANEOUS | Status: DC
Start: 2019-06-07 — End: 2019-06-07

## 2019-06-07 MED ORDER — ALLOPURINOL 100 MG PO TABS
100.00 | ORAL_TABLET | ORAL | Status: DC
Start: 2019-06-08 — End: 2019-06-07

## 2019-06-07 MED ORDER — DEXTROSE 10 % IV SOLN
125.00 | INTRAVENOUS | Status: DC
Start: ? — End: 2019-06-07

## 2019-06-07 MED ORDER — HYDRALAZINE HCL 25 MG PO TABS
25.00 | ORAL_TABLET | ORAL | Status: DC
Start: 2019-06-07 — End: 2019-06-07

## 2019-06-07 MED ORDER — APIXABAN 5 MG PO TABS
5.00 | ORAL_TABLET | ORAL | Status: DC
Start: 2019-06-07 — End: 2019-06-07

## 2019-06-07 MED ORDER — GLUCOSE 40 % PO GEL
15.00 | ORAL | Status: DC
Start: ? — End: 2019-06-07

## 2019-06-07 MED ORDER — MELATONIN 3 MG PO TABS
6.00 | ORAL_TABLET | ORAL | Status: DC
Start: 2019-06-07 — End: 2019-06-07

## 2019-06-07 MED ORDER — POTASSIUM CHLORIDE CRYS ER 20 MEQ PO TBCR
40.00 | EXTENDED_RELEASE_TABLET | ORAL | Status: DC
Start: 2019-06-07 — End: 2019-06-07

## 2019-06-07 NOTE — Progress Notes (Signed)
Needs recheck of BMP on Torsemide. Hoping it will go back to baseline as her nausea/vomiting had improved since hospital discharge but need to follow closely

## 2019-06-09 ENCOUNTER — Other Ambulatory Visit: Payer: Self-pay

## 2019-06-09 ENCOUNTER — Inpatient Hospital Stay (HOSPITAL_COMMUNITY)
Admission: EM | Admit: 2019-06-09 | Discharge: 2019-06-14 | DRG: 638 | Disposition: A | Discharge status: Home / Self Care | Payer: Medicare Other | Attending: Internal Medicine | Admitting: Internal Medicine

## 2019-06-09 DIAGNOSIS — Z79890 Hormone replacement therapy (postmenopausal): Secondary | ICD-10-CM

## 2019-06-09 DIAGNOSIS — E1122 Type 2 diabetes mellitus with diabetic chronic kidney disease: Secondary | ICD-10-CM | POA: Diagnosis present

## 2019-06-09 DIAGNOSIS — G4700 Insomnia, unspecified: Secondary | ICD-10-CM | POA: Diagnosis present

## 2019-06-09 DIAGNOSIS — R112 Nausea with vomiting, unspecified: Secondary | ICD-10-CM | POA: Diagnosis present

## 2019-06-09 DIAGNOSIS — F329 Major depressive disorder, single episode, unspecified: Secondary | ICD-10-CM | POA: Diagnosis present

## 2019-06-09 DIAGNOSIS — Z20822 Contact with and (suspected) exposure to covid-19: Secondary | ICD-10-CM | POA: Diagnosis present

## 2019-06-09 DIAGNOSIS — N182 Chronic kidney disease, stage 2 (mild): Secondary | ICD-10-CM | POA: Diagnosis present

## 2019-06-09 DIAGNOSIS — E860 Dehydration: Secondary | ICD-10-CM | POA: Diagnosis present

## 2019-06-09 DIAGNOSIS — R0602 Shortness of breath: Secondary | ICD-10-CM

## 2019-06-09 DIAGNOSIS — K219 Gastro-esophageal reflux disease without esophagitis: Secondary | ICD-10-CM | POA: Diagnosis present

## 2019-06-09 DIAGNOSIS — Z888 Allergy status to other drugs, medicaments and biological substances status: Secondary | ICD-10-CM

## 2019-06-09 DIAGNOSIS — K7581 Nonalcoholic steatohepatitis (NASH): Secondary | ICD-10-CM | POA: Diagnosis present

## 2019-06-09 DIAGNOSIS — M797 Fibromyalgia: Secondary | ICD-10-CM | POA: Diagnosis present

## 2019-06-09 DIAGNOSIS — Z7901 Long term (current) use of anticoagulants: Secondary | ICD-10-CM

## 2019-06-09 DIAGNOSIS — R0789 Other chest pain: Secondary | ICD-10-CM | POA: Diagnosis present

## 2019-06-09 DIAGNOSIS — D631 Anemia in chronic kidney disease: Secondary | ICD-10-CM | POA: Diagnosis present

## 2019-06-09 DIAGNOSIS — E861 Hypovolemia: Secondary | ICD-10-CM | POA: Diagnosis present

## 2019-06-09 DIAGNOSIS — I130 Hypertensive heart and chronic kidney disease with heart failure and stage 1 through stage 4 chronic kidney disease, or unspecified chronic kidney disease: Secondary | ICD-10-CM | POA: Diagnosis present

## 2019-06-09 DIAGNOSIS — Z885 Allergy status to narcotic agent status: Secondary | ICD-10-CM

## 2019-06-09 DIAGNOSIS — G8929 Other chronic pain: Secondary | ICD-10-CM | POA: Diagnosis present

## 2019-06-09 DIAGNOSIS — I5022 Chronic systolic (congestive) heart failure: Secondary | ICD-10-CM | POA: Diagnosis present

## 2019-06-09 DIAGNOSIS — Z8711 Personal history of peptic ulcer disease: Secondary | ICD-10-CM

## 2019-06-09 DIAGNOSIS — I10 Essential (primary) hypertension: Secondary | ICD-10-CM | POA: Diagnosis present

## 2019-06-09 DIAGNOSIS — E1110 Type 2 diabetes mellitus with ketoacidosis without coma: Secondary | ICD-10-CM | POA: Diagnosis not present

## 2019-06-09 DIAGNOSIS — Z9049 Acquired absence of other specified parts of digestive tract: Secondary | ICD-10-CM

## 2019-06-09 DIAGNOSIS — J449 Chronic obstructive pulmonary disease, unspecified: Secondary | ICD-10-CM | POA: Diagnosis present

## 2019-06-09 DIAGNOSIS — N179 Acute kidney failure, unspecified: Secondary | ICD-10-CM | POA: Diagnosis present

## 2019-06-09 DIAGNOSIS — Z8673 Personal history of transient ischemic attack (TIA), and cerebral infarction without residual deficits: Secondary | ICD-10-CM

## 2019-06-09 DIAGNOSIS — K760 Fatty (change of) liver, not elsewhere classified: Secondary | ICD-10-CM | POA: Diagnosis present

## 2019-06-09 DIAGNOSIS — E785 Hyperlipidemia, unspecified: Secondary | ICD-10-CM | POA: Diagnosis present

## 2019-06-09 DIAGNOSIS — E1140 Type 2 diabetes mellitus with diabetic neuropathy, unspecified: Secondary | ICD-10-CM

## 2019-06-09 DIAGNOSIS — Z7982 Long term (current) use of aspirin: Secondary | ICD-10-CM

## 2019-06-09 DIAGNOSIS — I480 Paroxysmal atrial fibrillation: Secondary | ICD-10-CM | POA: Diagnosis present

## 2019-06-09 DIAGNOSIS — E1165 Type 2 diabetes mellitus with hyperglycemia: Secondary | ICD-10-CM

## 2019-06-09 DIAGNOSIS — R809 Proteinuria, unspecified: Secondary | ICD-10-CM | POA: Diagnosis present

## 2019-06-09 DIAGNOSIS — N1832 Chronic kidney disease, stage 3b: Secondary | ICD-10-CM | POA: Diagnosis present

## 2019-06-09 DIAGNOSIS — Z886 Allergy status to analgesic agent status: Secondary | ICD-10-CM

## 2019-06-09 DIAGNOSIS — Z79899 Other long term (current) drug therapy: Secondary | ICD-10-CM

## 2019-06-09 DIAGNOSIS — Z833 Family history of diabetes mellitus: Secondary | ICD-10-CM

## 2019-06-09 DIAGNOSIS — Z91041 Radiographic dye allergy status: Secondary | ICD-10-CM

## 2019-06-09 DIAGNOSIS — Z8249 Family history of ischemic heart disease and other diseases of the circulatory system: Secondary | ICD-10-CM

## 2019-06-09 DIAGNOSIS — I428 Other cardiomyopathies: Secondary | ICD-10-CM | POA: Diagnosis present

## 2019-06-09 DIAGNOSIS — E1143 Type 2 diabetes mellitus with diabetic autonomic (poly)neuropathy: Secondary | ICD-10-CM | POA: Diagnosis present

## 2019-06-09 DIAGNOSIS — Z794 Long term (current) use of insulin: Secondary | ICD-10-CM

## 2019-06-09 DIAGNOSIS — F419 Anxiety disorder, unspecified: Secondary | ICD-10-CM | POA: Diagnosis present

## 2019-06-09 DIAGNOSIS — Z6841 Body Mass Index (BMI) 40.0 and over, adult: Secondary | ICD-10-CM

## 2019-06-09 DIAGNOSIS — IMO0002 Reserved for concepts with insufficient information to code with codable children: Secondary | ICD-10-CM

## 2019-06-09 DIAGNOSIS — N184 Chronic kidney disease, stage 4 (severe): Secondary | ICD-10-CM | POA: Diagnosis present

## 2019-06-09 DIAGNOSIS — E876 Hypokalemia: Secondary | ICD-10-CM | POA: Diagnosis present

## 2019-06-09 DIAGNOSIS — Z609 Problem related to social environment, unspecified: Secondary | ICD-10-CM

## 2019-06-09 LAB — CBG MONITORING, ED: Glucose-Capillary: 372 mg/dL — ABNORMAL HIGH (ref 70–99)

## 2019-06-10 ENCOUNTER — Other Ambulatory Visit: Payer: Self-pay

## 2019-06-10 ENCOUNTER — Encounter (HOSPITAL_COMMUNITY): Payer: Self-pay | Admitting: Family Medicine

## 2019-06-10 ENCOUNTER — Inpatient Hospital Stay (HOSPITAL_COMMUNITY): Payer: Medicare Other

## 2019-06-10 ENCOUNTER — Emergency Department (HOSPITAL_COMMUNITY): Payer: Medicare Other

## 2019-06-10 DIAGNOSIS — E1110 Type 2 diabetes mellitus with ketoacidosis without coma: Principal | ICD-10-CM

## 2019-06-10 DIAGNOSIS — D631 Anemia in chronic kidney disease: Secondary | ICD-10-CM | POA: Diagnosis present

## 2019-06-10 DIAGNOSIS — E785 Hyperlipidemia, unspecified: Secondary | ICD-10-CM | POA: Diagnosis present

## 2019-06-10 DIAGNOSIS — I480 Paroxysmal atrial fibrillation: Secondary | ICD-10-CM | POA: Diagnosis present

## 2019-06-10 DIAGNOSIS — N182 Chronic kidney disease, stage 2 (mild): Secondary | ICD-10-CM | POA: Diagnosis not present

## 2019-06-10 DIAGNOSIS — I10 Essential (primary) hypertension: Secondary | ICD-10-CM | POA: Diagnosis not present

## 2019-06-10 DIAGNOSIS — F329 Major depressive disorder, single episode, unspecified: Secondary | ICD-10-CM | POA: Diagnosis present

## 2019-06-10 DIAGNOSIS — I130 Hypertensive heart and chronic kidney disease with heart failure and stage 1 through stage 4 chronic kidney disease, or unspecified chronic kidney disease: Secondary | ICD-10-CM | POA: Diagnosis present

## 2019-06-10 DIAGNOSIS — Z794 Long term (current) use of insulin: Secondary | ICD-10-CM | POA: Diagnosis not present

## 2019-06-10 DIAGNOSIS — Z609 Problem related to social environment, unspecified: Secondary | ICD-10-CM | POA: Diagnosis not present

## 2019-06-10 DIAGNOSIS — K219 Gastro-esophageal reflux disease without esophagitis: Secondary | ICD-10-CM | POA: Diagnosis present

## 2019-06-10 DIAGNOSIS — G4700 Insomnia, unspecified: Secondary | ICD-10-CM | POA: Diagnosis present

## 2019-06-10 DIAGNOSIS — N1832 Chronic kidney disease, stage 3b: Secondary | ICD-10-CM | POA: Diagnosis present

## 2019-06-10 DIAGNOSIS — E876 Hypokalemia: Secondary | ICD-10-CM | POA: Diagnosis present

## 2019-06-10 DIAGNOSIS — Z20822 Contact with and (suspected) exposure to covid-19: Secondary | ICD-10-CM | POA: Diagnosis present

## 2019-06-10 DIAGNOSIS — E1143 Type 2 diabetes mellitus with diabetic autonomic (poly)neuropathy: Secondary | ICD-10-CM | POA: Diagnosis present

## 2019-06-10 DIAGNOSIS — Z6841 Body Mass Index (BMI) 40.0 and over, adult: Secondary | ICD-10-CM | POA: Diagnosis not present

## 2019-06-10 DIAGNOSIS — F419 Anxiety disorder, unspecified: Secondary | ICD-10-CM | POA: Diagnosis present

## 2019-06-10 DIAGNOSIS — K760 Fatty (change of) liver, not elsewhere classified: Secondary | ICD-10-CM | POA: Diagnosis present

## 2019-06-10 DIAGNOSIS — N179 Acute kidney failure, unspecified: Secondary | ICD-10-CM | POA: Diagnosis present

## 2019-06-10 DIAGNOSIS — Z7901 Long term (current) use of anticoagulants: Secondary | ICD-10-CM | POA: Diagnosis not present

## 2019-06-10 DIAGNOSIS — I5022 Chronic systolic (congestive) heart failure: Secondary | ICD-10-CM | POA: Diagnosis present

## 2019-06-10 DIAGNOSIS — E1122 Type 2 diabetes mellitus with diabetic chronic kidney disease: Secondary | ICD-10-CM | POA: Diagnosis present

## 2019-06-10 DIAGNOSIS — I428 Other cardiomyopathies: Secondary | ICD-10-CM | POA: Diagnosis present

## 2019-06-10 DIAGNOSIS — E860 Dehydration: Secondary | ICD-10-CM | POA: Diagnosis present

## 2019-06-10 LAB — BASIC METABOLIC PANEL
Anion gap: 13 (ref 5–15)
Anion gap: 12 (ref 5–15)
Anion gap: 10 (ref 5–15)
Anion gap: 13 (ref 5–15)
BUN: 14 mg/dL (ref 6–20)
BUN: 14 mg/dL (ref 6–20)
BUN: 15 mg/dL (ref 6–20)
BUN: 14 mg/dL (ref 6–20)
CO2: 28 mmol/L (ref 22–32)
CO2: 29 mmol/L (ref 22–32)
CO2: 30 mmol/L (ref 22–32)
CO2: 27 mmol/L (ref 22–32)
Calcium: 8.6 mg/dL — ABNORMAL LOW (ref 8.9–10.3)
Calcium: 8.4 mg/dL — ABNORMAL LOW (ref 8.9–10.3)
Calcium: 8.2 mg/dL — ABNORMAL LOW (ref 8.9–10.3)
Calcium: 8 mg/dL — ABNORMAL LOW (ref 8.9–10.3)
Chloride: 99 mmol/L (ref 98–111)
Chloride: 100 mmol/L (ref 98–111)
Chloride: 99 mmol/L (ref 98–111)
Chloride: 99 mmol/L (ref 98–111)
Creatinine, Ser: 2.05 mg/dL — ABNORMAL HIGH (ref 0.44–1.00)
Creatinine, Ser: 2.35 mg/dL — ABNORMAL HIGH (ref 0.44–1.00)
Creatinine, Ser: 2.7 mg/dL — ABNORMAL HIGH (ref 0.44–1.00)
Creatinine, Ser: 2.47 mg/dL — ABNORMAL HIGH (ref 0.44–1.00)
GFR calc Af Amer: 31 mL/min — ABNORMAL LOW (ref >60)
GFR calc Af Amer: 26 mL/min — ABNORMAL LOW (ref >60)
GFR calc Af Amer: 22 mL/min — ABNORMAL LOW (ref >60)
GFR calc Af Amer: 25 mL/min — ABNORMAL LOW (ref >60)
GFR calc non Af Amer: 27 mL/min — ABNORMAL LOW (ref >60)
GFR calc non Af Amer: 23 mL/min — ABNORMAL LOW (ref >60)
GFR calc non Af Amer: 19 mL/min — ABNORMAL LOW (ref >60)
GFR calc non Af Amer: 21 mL/min — ABNORMAL LOW (ref >60)
Glucose, Bld: 202 mg/dL — ABNORMAL HIGH (ref 70–99)
Glucose, Bld: 184 mg/dL — ABNORMAL HIGH (ref 70–99)
Glucose, Bld: 157 mg/dL — ABNORMAL HIGH (ref 70–99)
Glucose, Bld: 202 mg/dL — ABNORMAL HIGH (ref 70–99)
Potassium: 3.2 mmol/L — ABNORMAL LOW (ref 3.5–5.1)
Potassium: 3.7 mmol/L (ref 3.5–5.1)
Potassium: 3 mmol/L — ABNORMAL LOW (ref 3.5–5.1)
Potassium: 3.1 mmol/L — ABNORMAL LOW (ref 3.5–5.1)
Sodium: 140 mmol/L (ref 135–145)
Sodium: 141 mmol/L (ref 135–145)
Sodium: 139 mmol/L (ref 135–145)
Sodium: 139 mmol/L (ref 135–145)

## 2019-06-10 LAB — BLOOD GAS, VENOUS
Acid-Base Excess: 0.4 mmol/L (ref 0.0–2.0)
Bicarbonate: 20.3 mmol/L (ref 20.0–28.0)
O2 Saturation: 71 %
Patient temperature: 98.6
pCO2, Ven: 20.7 mmHg — ABNORMAL LOW (ref 44.0–60.0)
pH, Ven: 7.597 — ABNORMAL HIGH (ref 7.250–7.430)
pO2, Ven: 34.9 mmHg (ref 32.0–45.0)

## 2019-06-10 LAB — URINALYSIS, ROUTINE W REFLEX MICROSCOPIC
Bilirubin Urine: NEGATIVE
Glucose, UA: >=500 mg/dL — AB
Ketones, ur: 20 mg/dL — AB
Nitrite: NEGATIVE
Protein, ur: >=300 mg/dL — AB
Specific Gravity, Urine: 1.016 (ref 1.005–1.030)
pH: 5 (ref 5.0–8.0)

## 2019-06-10 LAB — COMPREHENSIVE METABOLIC PANEL
ALT: 16 U/L (ref 0–44)
AST: 25 U/L (ref 15–41)
Albumin: 3.7 g/dL (ref 3.5–5.0)
Alkaline Phosphatase: 101 U/L (ref 38–126)
Anion gap: 25 — ABNORMAL HIGH (ref 5–15)
BUN: 12 mg/dL (ref 6–20)
CO2: 18 mmol/L — ABNORMAL LOW (ref 22–32)
Calcium: 9.2 mg/dL (ref 8.9–10.3)
Chloride: 93 mmol/L — ABNORMAL LOW (ref 98–111)
Creatinine, Ser: 1.65 mg/dL — ABNORMAL HIGH (ref 0.44–1.00)
GFR calc Af Amer: 40 mL/min — ABNORMAL LOW (ref >60)
GFR calc non Af Amer: 35 mL/min — ABNORMAL LOW (ref >60)
Glucose, Bld: 392 mg/dL — ABNORMAL HIGH (ref 70–99)
Potassium: 3.6 mmol/L (ref 3.5–5.1)
Sodium: 136 mmol/L (ref 135–145)
Total Bilirubin: 1.6 mg/dL — ABNORMAL HIGH (ref 0.3–1.2)
Total Protein: 7.8 g/dL (ref 6.5–8.1)

## 2019-06-10 LAB — CBC WITH DIFFERENTIAL/PLATELET
Abs Immature Granulocytes: 0.04 10*3/uL (ref 0.00–0.07)
Basophils Absolute: 0 10*3/uL (ref 0.0–0.1)
Basophils Relative: 0 %
Eosinophils Absolute: 0 10*3/uL (ref 0.0–0.5)
Eosinophils Relative: 0 %
HCT: 34.6 % — ABNORMAL LOW (ref 36.0–46.0)
Hemoglobin: 10.6 g/dL — ABNORMAL LOW (ref 12.0–15.0)
Immature Granulocytes: 0 %
Lymphocytes Relative: 12 %
Lymphs Abs: 1.3 10*3/uL (ref 0.7–4.0)
MCH: 26.6 pg (ref 26.0–34.0)
MCHC: 30.6 g/dL (ref 30.0–36.0)
MCV: 86.9 fL (ref 80.0–100.0)
Monocytes Absolute: 0.3 10*3/uL (ref 0.1–1.0)
Monocytes Relative: 3 %
Neutro Abs: 8.5 10*3/uL — ABNORMAL HIGH (ref 1.7–7.7)
Neutrophils Relative %: 85 %
Platelets: 220 10*3/uL (ref 150–400)
RBC: 3.98 MIL/uL (ref 3.87–5.11)
RDW: 14.6 % (ref 11.5–15.5)
WBC: 10.2 10*3/uL (ref 4.0–10.5)
nRBC: 0 % (ref 0.0–0.2)

## 2019-06-10 LAB — GLUCOSE, CAPILLARY
Glucose-Capillary: 189 mg/dL — ABNORMAL HIGH (ref 70–99)
Glucose-Capillary: 170 mg/dL — ABNORMAL HIGH (ref 70–99)
Glucose-Capillary: 167 mg/dL — ABNORMAL HIGH (ref 70–99)
Glucose-Capillary: 162 mg/dL — ABNORMAL HIGH (ref 70–99)
Glucose-Capillary: 160 mg/dL — ABNORMAL HIGH (ref 70–99)
Glucose-Capillary: 145 mg/dL — ABNORMAL HIGH (ref 70–99)
Glucose-Capillary: 158 mg/dL — ABNORMAL HIGH (ref 70–99)
Glucose-Capillary: 190 mg/dL — ABNORMAL HIGH (ref 70–99)
Glucose-Capillary: 135 mg/dL — ABNORMAL HIGH (ref 70–99)

## 2019-06-10 LAB — TROPONIN I (HIGH SENSITIVITY)
Troponin I (High Sensitivity): 16 ng/L (ref <18)
Troponin I (High Sensitivity): 21 ng/L — ABNORMAL HIGH (ref <18)
Troponin I (High Sensitivity): 34 ng/L — ABNORMAL HIGH (ref <18)
Troponin I (High Sensitivity): 26 ng/L — ABNORMAL HIGH (ref <18)

## 2019-06-10 LAB — MRSA PCR SCREENING: MRSA by PCR: NEGATIVE

## 2019-06-10 LAB — BETA-HYDROXYBUTYRIC ACID
Beta-Hydroxybutyric Acid: 6.06 mmol/L — ABNORMAL HIGH (ref 0.05–0.27)
Beta-Hydroxybutyric Acid: 0.05 mmol/L (ref 0.05–0.27)

## 2019-06-10 LAB — LACTIC ACID, PLASMA
Lactic Acid, Venous: 5.1 mmol/L (ref 0.5–1.9)
Lactic Acid, Venous: 2.1 mmol/L (ref 0.5–1.9)
Lactic Acid, Venous: 2.1 mmol/L (ref 0.5–1.9)

## 2019-06-10 LAB — OSMOLALITY: Osmolality: 295 mOsm/kg (ref 275–295)

## 2019-06-10 LAB — CBG MONITORING, ED
Glucose-Capillary: 342 mg/dL — ABNORMAL HIGH (ref 70–99)
Glucose-Capillary: 296 mg/dL — ABNORMAL HIGH (ref 70–99)
Glucose-Capillary: 183 mg/dL — ABNORMAL HIGH (ref 70–99)
Glucose-Capillary: 176 mg/dL — ABNORMAL HIGH (ref 70–99)

## 2019-06-10 LAB — SODIUM, URINE, RANDOM: Sodium, Ur: 57 mmol/L

## 2019-06-10 LAB — OSMOLALITY, URINE: Osmolality, Ur: 359 mOsm/kg (ref 300–900)

## 2019-06-10 LAB — SARS CORONAVIRUS 2 (TAT 6-24 HRS): SARS Coronavirus 2: NEGATIVE

## 2019-06-10 LAB — MAGNESIUM: Magnesium: 1.1 mg/dL — ABNORMAL LOW (ref 1.7–2.4)

## 2019-06-10 LAB — LIPASE, BLOOD: Lipase: 23 U/L (ref 11–51)

## 2019-06-10 MED ORDER — ATORVASTATIN CALCIUM 10 MG PO TABS
10.0000 mg | ORAL_TABLET | Freq: Every day | ORAL | Status: DC
  Administered 2019-06-10 – 2019-06-13 (×4): 10 mg via ORAL
  Filled 2019-06-10 (×4): qty 1

## 2019-06-10 MED ORDER — HYDROMORPHONE HCL 1 MG/ML IJ SOLN
1.0000 mg | INTRAMUSCULAR | Status: DC | PRN
  Administered 2019-06-10 – 2019-06-12 (×6): 1 mg via INTRAVENOUS
  Filled 2019-06-10 (×7): qty 1

## 2019-06-10 MED ORDER — POTASSIUM CHLORIDE 10 MEQ/100ML IV SOLN
10.0000 meq | INTRAVENOUS | Status: AC
  Administered 2019-06-10 (×2): 10 meq via INTRAVENOUS
  Filled 2019-06-10 (×2): qty 100

## 2019-06-10 MED ORDER — POTASSIUM CHLORIDE 10 MEQ/100ML IV SOLN
10.0000 meq | INTRAVENOUS | Status: AC
  Administered 2019-06-10 (×2): 10 meq via INTRAVENOUS
  Filled 2019-06-10: qty 100

## 2019-06-10 MED ORDER — HYDROMORPHONE HCL 1 MG/ML IJ SOLN
1.0000 mg | Freq: Once | INTRAMUSCULAR | Status: AC
  Administered 2019-06-10: 1 mg via INTRAVENOUS
  Filled 2019-06-10: qty 1

## 2019-06-10 MED ORDER — ONDANSETRON HCL 4 MG/2ML IJ SOLN: 4.0000 mg | Freq: Four times a day (QID) | INTRAMUSCULAR | Status: DC | PRN

## 2019-06-10 MED ORDER — ASPIRIN EC 81 MG PO TBEC: 81.0000 mg | DELAYED_RELEASE_TABLET | Freq: Every day | ORAL | Status: DC

## 2019-06-10 MED ORDER — NITROGLYCERIN 0.4 MG SL SUBL: 0.4000 mg | SUBLINGUAL_TABLET | SUBLINGUAL | Status: DC | PRN

## 2019-06-10 MED ORDER — APIXABAN 5 MG PO TABS
5.0000 mg | ORAL_TABLET | Freq: Two times a day (BID) | ORAL | Status: DC
  Administered 2019-06-10 – 2019-06-14 (×9): 5 mg via ORAL
  Filled 2019-06-10 (×6): qty 1
  Filled 2019-06-10: qty 2
  Filled 2019-06-10 (×2): qty 1

## 2019-06-10 MED ORDER — INSULIN ASPART 100 UNIT/ML ~~LOC~~ SOLN
0.0000 [IU] | Freq: Every day | SUBCUTANEOUS | Status: DC
  Administered 2019-06-13: 2 [IU] via SUBCUTANEOUS

## 2019-06-10 MED ORDER — DEXTROSE 50 % IV SOLN: 0.0000 mL | INTRAVENOUS | Status: DC | PRN

## 2019-06-10 MED ORDER — POTASSIUM CHLORIDE CRYS ER 20 MEQ PO TBCR
40.0000 meq | EXTENDED_RELEASE_TABLET | Freq: Once | ORAL | Status: AC
  Administered 2019-06-10: 40 meq via ORAL
  Filled 2019-06-10: qty 2

## 2019-06-10 MED ORDER — SODIUM CHLORIDE 0.9 % IV SOLN: INTRAVENOUS | Status: DC

## 2019-06-10 MED ORDER — MELATONIN 3 MG PO TABS
6.0000 mg | ORAL_TABLET | Freq: Every day | ORAL | Status: DC
  Administered 2019-06-10 – 2019-06-13 (×4): 6 mg via ORAL
  Filled 2019-06-10 (×4): qty 2

## 2019-06-10 MED ORDER — DICYCLOMINE HCL 20 MG PO TABS
20.0000 mg | ORAL_TABLET | Freq: Three times a day (TID) | ORAL | Status: DC | PRN
  Administered 2019-06-10: 20 mg via ORAL
  Filled 2019-06-10 (×2): qty 1

## 2019-06-10 MED ORDER — HALOPERIDOL LACTATE 5 MG/ML IJ SOLN
5.0000 mg | Freq: Once | INTRAMUSCULAR | Status: AC
  Administered 2019-06-10: 5 mg via INTRAVENOUS
  Filled 2019-06-10: qty 1

## 2019-06-10 MED ORDER — INSULIN REGULAR(HUMAN) IN NACL 100-0.9 UT/100ML-% IV SOLN
INTRAVENOUS | Status: DC
  Administered 2019-06-10: 13 [IU]/h via INTRAVENOUS
  Filled 2019-06-10: qty 100

## 2019-06-10 MED ORDER — LACTATED RINGERS IV SOLN: INTRAVENOUS | Status: AC

## 2019-06-10 MED ORDER — DULOXETINE HCL 20 MG PO CPEP
40.0000 mg | ORAL_CAPSULE | Freq: Every day | ORAL | Status: DC
  Administered 2019-06-11 – 2019-06-14 (×4): 40 mg via ORAL
  Filled 2019-06-10 (×4): qty 2

## 2019-06-10 MED ORDER — PANTOPRAZOLE SODIUM 40 MG PO TBEC
40.0000 mg | DELAYED_RELEASE_TABLET | Freq: Two times a day (BID) | ORAL | Status: DC
  Administered 2019-06-10 – 2019-06-14 (×9): 40 mg via ORAL
  Filled 2019-06-10 (×9): qty 1

## 2019-06-10 MED ORDER — INSULIN GLARGINE 100 UNIT/ML ~~LOC~~ SOLN
30.0000 [IU] | Freq: Every day | SUBCUTANEOUS | Status: DC
  Administered 2019-06-10 – 2019-06-13 (×3): 30 [IU] via SUBCUTANEOUS
  Filled 2019-06-10 (×5): qty 0.3

## 2019-06-10 MED ORDER — POTASSIUM CHLORIDE 10 MEQ/100ML IV SOLN: 10.0000 meq | INTRAVENOUS | Status: DC

## 2019-06-10 MED ORDER — DULOXETINE HCL 20 MG PO CPEP
40.0000 mg | ORAL_CAPSULE | Freq: Two times a day (BID) | ORAL | Status: DC
  Administered 2019-06-10: 40 mg via ORAL
  Filled 2019-06-10: qty 2

## 2019-06-10 MED ORDER — CHLORHEXIDINE GLUCONATE CLOTH 2 % EX PADS
6.0000 | MEDICATED_PAD | Freq: Every day | CUTANEOUS | Status: DC
  Administered 2019-06-10: 6 via TOPICAL

## 2019-06-10 MED ORDER — DEXTROSE-NACL 5-0.45 % IV SOLN: INTRAVENOUS | Status: DC

## 2019-06-10 MED ORDER — AMLODIPINE BESYLATE 10 MG PO TABS
10.0000 mg | ORAL_TABLET | Freq: Every morning | ORAL | Status: DC
  Administered 2019-06-10 – 2019-06-11 (×2): 10 mg via ORAL
  Filled 2019-06-10 (×4): qty 1

## 2019-06-10 MED ORDER — INSULIN ASPART 100 UNIT/ML ~~LOC~~ SOLN
0.0000 [IU] | Freq: Three times a day (TID) | SUBCUTANEOUS | Status: DC
  Administered 2019-06-11: 3 [IU] via SUBCUTANEOUS
  Administered 2019-06-11: 7 [IU] via SUBCUTANEOUS
  Administered 2019-06-11: 2 [IU] via SUBCUTANEOUS
  Administered 2019-06-12: 3 [IU] via SUBCUTANEOUS
  Administered 2019-06-12: 5 [IU] via SUBCUTANEOUS
  Administered 2019-06-12: 3 [IU] via SUBCUTANEOUS
  Administered 2019-06-13: 2 [IU] via SUBCUTANEOUS
  Administered 2019-06-13 (×2): 3 [IU] via SUBCUTANEOUS
  Administered 2019-06-14: 2 [IU] via SUBCUTANEOUS
  Administered 2019-06-14: 3 [IU] via SUBCUTANEOUS

## 2019-06-10 MED ORDER — METOCLOPRAMIDE HCL 5 MG/ML IJ SOLN
10.0000 mg | Freq: Once | INTRAMUSCULAR | Status: AC
  Administered 2019-06-10: 10 mg via INTRAVENOUS
  Filled 2019-06-10: qty 2

## 2019-06-10 MED ORDER — METOPROLOL SUCCINATE ER 25 MG PO TB24
25.0000 mg | ORAL_TABLET | Freq: Every day | ORAL | Status: DC
  Administered 2019-06-10 – 2019-06-14 (×5): 25 mg via ORAL
  Filled 2019-06-10 (×4): qty 1

## 2019-06-10 MED ORDER — SODIUM CHLORIDE 0.9 % IV BOLUS
500.0000 mL | Freq: Once | INTRAVENOUS | Status: AC
  Administered 2019-06-10: 500 mL via INTRAVENOUS

## 2019-06-10 MED ORDER — LACTATED RINGERS IV BOLUS
1000.0000 mL | Freq: Once | INTRAVENOUS | Status: AC
  Administered 2019-06-10: 1000 mL via INTRAVENOUS

## 2019-06-10 MED ORDER — ALBUTEROL SULFATE (2.5 MG/3ML) 0.083% IN NEBU: 2.5000 mg | INHALATION_SOLUTION | Freq: Four times a day (QID) | RESPIRATORY_TRACT | Status: DC | PRN

## 2019-06-10 MED ORDER — POTASSIUM CHLORIDE 10 MEQ/100ML IV SOLN
10.0000 meq | INTRAVENOUS | Status: DC
  Administered 2019-06-10: 10 meq via INTRAVENOUS
  Filled 2019-06-10: qty 100

## 2019-06-10 MED ORDER — METOCLOPRAMIDE HCL 5 MG/ML IJ SOLN
10.0000 mg | Freq: Three times a day (TID) | INTRAMUSCULAR | Status: DC
  Administered 2019-06-10 – 2019-06-13 (×9): 10 mg via INTRAVENOUS
  Filled 2019-06-10 (×10): qty 2

## 2019-06-10 MED ORDER — HYDRALAZINE HCL 25 MG PO TABS
25.0000 mg | ORAL_TABLET | Freq: Three times a day (TID) | ORAL | Status: DC
  Administered 2019-06-10 – 2019-06-14 (×11): 25 mg via ORAL
  Filled 2019-06-10 (×13): qty 1

## 2019-06-10 NOTE — ED Notes (Signed)
Patient transported to CT 

## 2019-06-10 NOTE — Consult Note (Addendum)
Renal Service Consult Note [place] 06/10/2019 Sol Blazing Requesting Physician: Dr Neysa Bonito  Reason for Consult:  Renal failure HPI: The patient is a 54 y.o. year-old with hx of CVA, PAF, NICM, obesity, HTN, HL, gout, DM2, gastroparesis, FM, anemia and CKD 3 who presented w/ N/V and abd pain early this am to ED.  BS 390 and +beta hydroxy w/ AG of 25.  Pt admitted for DKA and started on IV insulin.  Pt's creat was 1.6 on admit, up to 2.0 this am and 2.47 this afternoon.  Asked to see for AoCKD 3.    Pt did not receive contrast w/ CT this am.  UOP not much recorded today.  BP's were soft but have improved up to 120/ 80 range.  94% on RA. CXR negative.   Pt seen in room.  Had been vomiting " a lot" for 2 days prior to presentation. Feeling better w/ insulin and IVF's. Voided 1st time about 1 hr ago and 2 L bolus.  No SOB or cough.    ROS  denies CP  no joint pain   no HA  no blurry vision  no rash  no dysuria  no difficulty voiding  no change in urine color    Past Medical History  Past Medical History:  Diagnosis Date  . Acute back pain with sciatica, left   . Acute back pain with sciatica, right   . Acute on chronic congestive heart failure ([place])   . AKI (acute kidney injury) ([place])   . Anemia, unspecified   . Chest pain 12/2015  . Chronic pain   . Chronic systolic CHF (congestive heart failure) ([place])   . Cough   . Diabetes mellitus   . Esophageal reflux   . Fibromyalgia   . Flu-like symptoms 04/05/2019  . Gastric ulcer   . Gastroparesis   . Gout   . Hyperlipidemia   . Hypertension   . Hypokalemia   . Hypomagnesemia   . Lumbosacral stenosis   . NICM (nonischemic cardiomyopathy) ([place])   . Obesity   . PAF (paroxysmal atrial fibrillation) ([place])   . Pneumonia   . Stroke ([place]) 02/2011  . Vitamin B12 deficiency anemia    Past Surgical History  Past Surgical History:  Procedure Laterality Date  . CATARACT EXTRACTION  01/2014   . CHOLECYSTECTOMY     Family History  Family History  Problem Relation Age of Onset  . Diabetes Mother   . Diabetes Father   . Heart disease Father   . Diabetes Sister   . Congestive Heart Failure Sister 62  . Diabetes Brother    Social History  reports that she has never smoked. She has never used smokeless tobacco. She reports that she does not drink alcohol or use drugs. Allergies  Allergies  Allergen Reactions  . Contrast Media [Iodinated Diagnostic Agents] Anaphylaxis    Cardiac arrest  . Diazepam [name]  . Isovue [Iopamidol] Anaphylaxis    Patient had seizure like activity and then code post 100 cc of isovue 300  . Lisinopril Anaphylaxis    Tongue and mouth swelling  . Penicillins Palpitations    Has patient had a PCN reaction causing immediate rash, facial/tongue/throat swelling, SOB or lightheadedness with hypotension: Yes, heart races Has patient had a PCN reaction causing severe rash involving mucus membranes or skin necrosis: No Has patient had a PCN reaction that required hospitalization: Yes  Has patient had a PCN reaction  occurring within the last 10 years: No   . Acetaminophen Nausea Only and Other (See Comments)    Irritates stomach ulcer  Abdominal pain  . Tolmetin Nausea Only and Other (See Comments)    ULCER  . Aspirin Other (See Comments)    Irritates stomach ulcer   . Food Hives and Swelling    Reaction to Carrots, ketchup   . Nsaids Other (See Comments)    ULCER  . Tramadol Nausea And Vomiting   Home medications Prior to Admission medications   Medication Sig Start Date End Date Taking? Authorizing Provider  albuterol (PROVENTIL) (2.5 MG/3ML) 0.083% nebulizer solution Take 3 mLs (2.5 mg total) by nebulization every 6 (six) hours as needed for wheezing or shortness of breath. 04/06/19  Yes Scot Jun, FNP  amLODipine (NORVASC) 10 MG tablet Take 10 mg by mouth every morning. 04/19/19  Yes [provider]  apixaban  (ELIQUIS) 5 MG TABS tablet Take 1 tablet (5 mg total) by mouth 2 (two) times daily. 05/09/19  Yes Gifford Shave, MD  aspirin EC 81 MG tablet Take 81 mg by mouth daily.   Yes [provider]  atorvastatin (LIPITOR) 10 MG tablet Take 1 tablet (10 mg total) by mouth daily. 12/13/18  Yes Lockamy, Timothy, DO  blood glucose meter kit and supplies KIT Dispense based on patient and insurance preference. Use up to four times daily as directed. (FOR ICD-9 250.00, 250.01). 12/13/18  Yes Lockamy, Christia [place], DO  cetirizine (ZYRTEC) 10 MG tablet Take 1 tablet (10 mg total) by mouth daily. 12/13/18  Yes Lockamy, Timothy, DO  DULoxetine HCl 40 MG CPEP TAKE ONE CAPSULE BY MOUTH TWICE DAILY 06/04/19  Yes Lockamy, Timothy, DO  fluticasone (FLONASE) 50 MCG/ACT nasal spray Place 2 sprays into both nostrils daily as needed for allergies or rhinitis. 12/19/18  Yes Rai, Ripudeep K, MD  hydrALAZINE (APRESOLINE) 25 MG tablet TAKE ONE TABLET BY MOUTH THREE TIMES A DAY. FOR BLOOD PRESSURE Patient taking differently: Take 25 mg by mouth 3 (three) times daily. For blood pressure 04/19/19  Yes Lockamy, Timothy, DO  insulin aspart (NOVOLOG FLEXPEN) 100 UNIT/ML FlexPen Inject 30 Units into the skin 4 (four) times daily.   Yes [provider]  LANTUS SOLOSTAR 100 UNIT/ML Solostar Pen Inject 30 Units into the skin at bedtime. Patient taking differently: Inject 45 Units into the skin at bedtime.  03/17/19  Yes Welborn, Ryan, DO  loperamide (IMODIUM) 2 MG capsule Take 2 mg by mouth 4 (four) times daily as needed for diarrhea or loose stools.   Yes [provider]  Melatonin 3 MG TABS Take 2 tablets (6 mg total) by mouth at bedtime. 12/13/18  Yes Lockamy, Timothy, DO  metoCLOPramide (REGLAN) 10 MG tablet Take 1 tablet (10 mg total) by mouth 3 (three) times daily before meals. 12/19/18  Yes Rai, Ripudeep K, MD  metoprolol succinate (TOPROL-XL) 25 MG 24 hr tablet Take 25 mg by mouth daily. 06/01/19  Yes [provider]  nitroGLYCERIN (NITROSTAT) 0.4 MG SL tablet Place 1 tablet (0.4 mg total) under the tongue every 5 (five) minutes as needed for chest pain. 12/13/18  Yes Lockamy, Timothy, DO  ondansetron (ZOFRAN-ODT) 4 MG disintegrating tablet Take 4 mg by mouth every 8 (eight) hours as needed. 06/07/19  Yes [provider]  pantoprazole (PROTONIX) 40 MG tablet Take 1 tablet (40 mg total) by mouth 2 (two) times daily. 03/17/19 06/10/19 Yes Welborn, Ryan, DO  torsemide (DEMADEX) 20 MG tablet Take  2 tablets (40 mg total) by mouth daily. 06/04/19  Yes Lockamy, Timothy, DO  dicyclomine (BENTYL) 20 MG tablet Take 1 tablet (20 mg total) by mouth 3 (three) times daily as needed for spasms (abdominal cramping). 04/06/19 05/18/19  [name], DO     Vitals:   06/10/19 1600 06/10/19 1700 06/10/19 1900 06/10/19 1952  BP: (!) 114/38 120/70 121/67   Pulse: 98 91 96   Resp: 19 (!) 22 20   Temp:    98.4 F (36.9 C)  TempSrc:    Oral  SpO2: 95% 94% 95%   Weight:      Height:       Exam Gen obese AAF, no distress, lying flat No rash, cyanosis or gangrene Sclera anicteric, throat clear and slightly dry  No jvd or bruits Chest clear bilat to bases no rales, wheezing or bronchial BS RRR no MRG Abd soft ntnd no mass or ascites +bs obese GU defer MS no joint effusions or deformity Ext no pretib or hip edema, no wounds or ulcers Neuro is alert, Ox 3 , nf, no asterixis    Home meds:  - norvasc 10/ torsemide 40 qd/ hydralazine 25 tid/ metoprolol xl 25 qd  - sl ntg prn/ aspirin 81/ lipitor 10  - lantus 30u hs  - eliquis 5 bid  - duloxetine 40 bid  - protonoix 40 bid/ reglan 10 tid  - prn's/ vitamins/ supplements    Date  Creat  eGFR   2011- 16 0.80- 1.00   2017- 19 1.00- 1.41   2020  1.30- 1.70   Jan 2021 1.34- 2.17 25-45   Mar 2021 1.29- 1.41 42- 47   06/09/19 1.65  35, CKD III   06/10/19 2.0 > 2.4     UA 4/4 -- many bact, 21-50 wbc/ 11-20 rbc, >300  prot    UNa - 57    CT abd / pelv  06/10/19 > normal appearing kidneys w/o hydro or stones    Renal US  12/12 cm kidneys, no hydro, normal echo   Assessment/ Plan: 1. AoCKD 3 - baseline creat 1.4- 1.7.  Creat 1.6 on admit last night, up to 2.7 this afternoon, had not voided.  Voiding now after additional 2L bolus and creat down 2.4.  Pt was quite dry and I believe this is prob just sig vol depletion from all the emesis she had.  Would continue IVF at 125 /hr and pt can drink as well as tolerated. Proteinuria most c/w diab neph underlying CKD.  2. DKA - per primary team 3. CM/ syst CHF - no signs of vol overload 4. HTN - cont home meds as needed 5. Abd pain - per primary, CT abd negative      Rob Anabel Lykins  MD 06/10/2019, 8:04 PM  Recent Labs  Lab 06/09/19 2350  WBC 10.2  HGB 10.6*   Recent Labs  Lab 06/04/19 0958 06/04/19 0958 06/09/19 2350 06/09/19 2350 06/10/19 0625 06/10/19 0625 06/10/19 0932 06/10/19 0932 06/10/19 1412 06/10/19 1842  K 2.9*   < > 3.6   < > 3.2*   < > 3.7   < > 3.0* 3.1*  BUN 18  --  12   < > 14   < > 14   < > 15 14  CREATININE 1.93*   < > 1.65*   < > 2.05*   < > 2.35*   < > 2.70* 2.47*  CALCIUM 8.8  --  9.2  --  8.6*  --  8.4*  --  8.2* 8.0*   < > = values in this interval not displayed.

## 2019-06-10 NOTE — ED Notes (Signed)
Pt. Documented in error see above note in chart. 

## 2019-06-10 NOTE — Progress Notes (Signed)
Deborah [name] is a 54 y.o. female with a history of morbid obesity, poorly controlled type 2 diabetes, hypertension, CHF EF 40 to 45% in 2020 who was admitted early this morning by Dr. Marice Potter for DKA.  Currently, in SDU with intermittent abdominal pain requiring Dilaudid.  Having improved blood pressure Distant symptoms similar to presentation  PE: General: arousable and conversant, no acute distress, looks ill Heart: S1 and S2 auscultated, no murmurs  Lungs: Clear to auscultation bilaterally, no wheeze    A/P  1. DKA with poorly controlled type 2 diabetes a. HA1C 8.3 b. Still on insulin drip, now on D5 half-normal salinea c. Continue DKA protocol d. will increase rate of IV fluids but need to monitor for volume overload with history of CHF  2. AKI on CKD 2 a. Secondary to DKA b. Renally dose medications, will decrease Cymbalta for this reason c. will increase IV fluids and continue to trend labs per DKA protocol  3. Compensated HFrEF a. Echo 01/26/2019: EF 40 to 45%, decreased from 2019 with global hypokinesis and small pericardial effusion.  Need to monitor volume status closely she has been getting high volumes of fluid b. Daily weights and I/O  4. Acute on chronic abdominal pain secondary to DKA with possible component of gastroparesis a. Continue current pain regimen and treat underlying issue  5. Hypertension on home metoprolol, hydralazine and amlodipine 6. Hyperlipidemia on home statin 7. Insomnia on melatonin nightly 8. Anxiety/depression with changes to home Cymbalta as above 9. GERD on [place], DO [facility] Pager [phone]

## 2019-06-10 NOTE — ED Provider Notes (Signed)
[facility] DEPT Provider Note   [name]: [id number] Arrival date & time: 06/09/19  2320     History Chief Complaint  Patient presents with  . [name]    Deborah [name] is a 54 y.o. female presenting for evaluation of nausea, vomiting, abdominal pain, shortness of breath.  Patient states she developed nausea and vomiting today.  She has associated abdominal pain and chest pain.  Due to this, she is feeling short of breath.  Patient states this feels similar to when she has gastroparesis.  It does not feel similar to when she has DKA.  She has not taken any medicine for this.  She states that her gastroparesis is normally treated with morphine and Dilaudid in the hospital, she does not have any home medications for this.  She does not check her sugars regularly, she did take her insulin today.  She denies fevers, chills, cough, urinary symptoms, abnormal bowel movements.  Additional history obtained from chart review.  Patient with a history of CHF, chronic pain, diabetes, fibromyalgia, gastroparesis, hypertension, hyperlipidemia, paroxysmal A. fib on anticoagulation, previous stroke, COPD  HPI     Past Medical History:  Diagnosis Date  . Acute back pain with sciatica, left   . Acute back pain with sciatica, right   . Acute on chronic congestive heart failure ([place])   . AKI (acute kidney injury) ([place])   . Anemia, unspecified   . Chest pain 12/2015  . Chronic pain   . Chronic systolic CHF (congestive heart failure) ([place])   . Cough   . Diabetes mellitus   . Esophageal reflux   . Fibromyalgia   . Flu-like symptoms 04/05/2019  . Gastric ulcer   . Gastroparesis   . Gout   . Hyperlipidemia   . Hypertension   . Hypokalemia   . Hypomagnesemia   . Lumbosacral stenosis   . NICM (nonischemic cardiomyopathy) ([place])   . Obesity   . PAF (paroxysmal atrial fibrillation) ([place])   . Pneumonia   . Stroke ([place]) 02/2011  . Vitamin B12 deficiency  anemia     Patient Active Problem List   Diagnosis Date Noted  . Nonalcoholic fatty liver disease 06/05/2019  . Chronic diarrhea   . COPD exacerbation ([place]) 05/08/2019  . Acute on chronic HFrEF (heart failure with reduced ejection fraction) ([place])   . High risk social situation   . Restrictive lung disease secondary to obesity   . Physical deconditioning   . [name] 05/07/2019  . History of gastric ulcer   . Uncontrolled type 2 diabetes mellitus with hyperglycemia ([place])   . Fibroid uterus 02/23/2019  . Congestion of nasal sinus 01/24/2019  . Chronic systolic CHF (congestive heart failure) ([place]) 12/19/2018  . Intractable nausea and vomiting 12/17/2018  . Hypoxia 12/17/2018  . Hyperglycemia 12/17/2018  . Elevated troponin I level   . Vomiting 07/18/2018  . Abdominal pain 07/17/2018  . Hyperkalemia 07/17/2018  . Cardiac arrest ([place]) 11/29/2017  . Pelvic mass 11/29/2017  . Leukocytosis 11/29/2017  . Anxiety 11/29/2017  . Allergic reaction to contrast media, initial encounter 11/28/2017  . Palliative care encounter   . Back pain 03/19/2017  . Stroke (cerebrum) ([place]) 03/19/2017  . GERD (gastroesophageal reflux disease) 03/19/2017  . Depression 03/19/2017  . Morbid obesity ([place])   . Urinary tract infection 08/16/2016  . Normocytic normochromic anemia 08/16/2016  . Gastroparesis 08/16/2016  . Intractable vomiting with nausea 06/17/2016  . Diabetic gastroparesis ([place]) 06/05/2016  .  Gout 06/05/2016  . AKI (acute kidney injury) ([place]) 12/06/2015  . Chest pain 09/26/2015  . Hypokalemia 09/26/2015  . Hypomagnesemia 09/26/2015  . Nausea and vomiting 08/20/2015  . Gout flare 05/27/2015  . Lower abdominal pain 05/26/2015  . DKA (diabetic ketoacidoses) ([place]) 05/25/2015  . Uncontrolled type 2 diabetes mellitus with diabetic neuropathy, with long-term current use of insulin ([place]) 05/25/2015  . Type 2 diabetes mellitus with hyperglycemia, with long-term current use of insulin  ([place]) 05/25/2015  . CKD (chronic kidney disease), stage II 05/25/2015  . Essential hypertension, benign 09/28/2013    Past Surgical History:  Procedure Laterality Date  . CATARACT EXTRACTION  01/2014  . CHOLECYSTECTOMY       OB History   No obstetric history on file.     Family History  Problem Relation Age of Onset  . Diabetes Mother   . Diabetes Father   . Heart disease Father   . Diabetes Sister   . Congestive Heart Failure Sister 11  . Diabetes Brother     Social History   Tobacco Use  . Smoking status: Never Smoker  . Smokeless tobacco: Never Used  Substance Use Topics  . Alcohol use: No  . Drug use: No    Home Medications Prior to Admission medications   Medication Sig Start Date End Date Taking? Authorizing Provider  albuterol (PROVENTIL) (2.5 MG/3ML) 0.083% nebulizer solution Take 3 mLs (2.5 mg total) by nebulization every 6 (six) hours as needed for wheezing or shortness of breath. 04/06/19  Yes Scot Jun, FNP  amLODipine (NORVASC) 10 MG tablet Take 10 mg by mouth every morning. 04/19/19  Yes [provider]  apixaban (ELIQUIS) 5 MG TABS tablet Take 1 tablet (5 mg total) by mouth 2 (two) times daily. 05/09/19  Yes Gifford Shave, MD  aspirin EC 81 MG tablet Take 81 mg by mouth daily.   Yes [provider]  atorvastatin (LIPITOR) 10 MG tablet Take 1 tablet (10 mg total) by mouth daily. 12/13/18  Yes Lockamy, Timothy, DO  blood glucose meter kit and supplies KIT Dispense based on patient and insurance preference. Use up to four times daily as directed. (FOR ICD-9 250.00, 250.01). 12/13/18  Yes Lockamy, Christia [place], DO  cetirizine (ZYRTEC) 10 MG tablet Take 1 tablet (10 mg total) by mouth daily. 12/13/18  Yes Lockamy, Timothy, DO  DULoxetine HCl 40 MG CPEP TAKE ONE CAPSULE BY MOUTH TWICE DAILY 06/04/19  Yes Lockamy, Timothy, DO  fluticasone (FLONASE) 50 MCG/ACT nasal spray Place 2 sprays into both nostrils daily as needed for allergies or  rhinitis. 12/19/18  Yes Rai, Ripudeep K, MD  hydrALAZINE (APRESOLINE) 25 MG tablet TAKE ONE TABLET BY MOUTH THREE TIMES A DAY. FOR BLOOD PRESSURE Patient taking differently: Take 25 mg by mouth 3 (three) times daily. For blood pressure 04/19/19  Yes Lockamy, Timothy, DO  insulin aspart (NOVOLOG FLEXPEN) 100 UNIT/ML FlexPen Inject 30 Units into the skin 4 (four) times daily.   Yes [provider]  LANTUS SOLOSTAR 100 UNIT/ML Solostar Pen Inject 30 Units into the skin at bedtime. Patient taking differently: Inject 45 Units into the skin at bedtime.  03/17/19  Yes Welborn, Ryan, DO  loperamide (IMODIUM) 2 MG capsule Take 2 mg by mouth 4 (four) times daily as needed for diarrhea or loose stools.   Yes [provider]  Melatonin 3 MG TABS Take 2 tablets (6 mg total) by mouth at bedtime. 12/13/18  Yes Lockamy, Timothy, DO  metoCLOPramide (REGLAN) 10 MG  tablet Take 1 tablet (10 mg total) by mouth 3 (three) times daily before meals. 12/19/18  Yes Rai, Ripudeep K, MD  metoprolol succinate (TOPROL-XL) 25 MG 24 hr tablet Take 25 mg by mouth daily. 06/01/19  Yes [provider]  nitroGLYCERIN (NITROSTAT) 0.4 MG SL tablet Place 1 tablet (0.4 mg total) under the tongue every 5 (five) minutes as needed for chest pain. 12/13/18  Yes Lockamy, Timothy, DO  ondansetron (ZOFRAN-ODT) 4 MG disintegrating tablet Take 4 mg by mouth every 8 (eight) hours as needed. 06/07/19  Yes [provider]  pantoprazole (PROTONIX) 40 MG tablet Take 1 tablet (40 mg total) by mouth 2 (two) times daily. 03/17/19 06/10/19 Yes Welborn, Ryan, DO  torsemide (DEMADEX) 20 MG tablet Take 2 tablets (40 mg total) by mouth daily. 06/04/19  Yes Lockamy, Timothy, DO  dicyclomine (BENTYL) 20 MG tablet Take 1 tablet (20 mg total) by mouth 3 (three) times daily as needed for spasms (abdominal cramping). 04/06/19 05/18/19  [name], DO    Allergies    Contrast media [iodinated diagnostic agents], Diazepam, Isovue  [iopamidol], Lisinopril, Penicillins, Acetaminophen, Tolmetin, Aspirin, Food, Nsaids, and Tramadol  Review of Systems   Review of Systems  Respiratory: Positive for shortness of breath.   Cardiovascular: Positive for chest pain.  Gastrointestinal: Positive for abdominal pain, nausea and vomiting.  All other systems reviewed and are negative.   Physical Exam Updated Vital Signs BP 109/84   Pulse (!) 112   Temp 98.8 F (37.1 C)   Resp (!) 22   LMP 10/10/2012   SpO2 100%   Physical Exam Vitals and nursing note reviewed.  Constitutional:      Appearance: She is well-developed. She is obese. She is ill-appearing.     Comments: Obese female who appears uncomfortable due to pain.  Appears chronically ill  HENT:     Head: Normocephalic and atraumatic.     Mouth/Throat:     Mouth: Mucous membranes are dry.  Eyes:     Conjunctiva/sclera: Conjunctivae normal.     Pupils: Pupils are equal, round, and reactive to light.  Cardiovascular:     Rate and Rhythm: Regular rhythm. Tachycardia present.     Pulses: Normal pulses.     Comments: Tachycardic around 120 Pulmonary:     Effort: Pulmonary effort is normal. Tachypnea present. No respiratory distress.     Breath sounds: Normal breath sounds. No wheezing.     Comments: Tachypneic.  Speaking in short sentences.  Clear lung sounds in all fields Abdominal:     General: There is no distension.     Palpations: Abdomen is soft. There is no mass.     Tenderness: There is abdominal tenderness. There is no guarding or rebound.     Comments: Generalized tenderness palpation the abdomen.  No rigidity, no distention.  Negative rebound.  No peritonitis.  Exam limited due to body habitus.  Musculoskeletal:        General: Normal range of motion.     Cervical back: Normal range of motion and neck supple.  Skin:    General: Skin is warm and dry.     Capillary Refill: Capillary refill takes less than 2 seconds.  Neurological:     Mental Status:  She is alert and oriented to person, place, and time.     ED Results / Procedures / Treatments   Labs (all labs ordered are listed, but only abnormal results are displayed) Labs Reviewed  CBC WITH DIFFERENTIAL/PLATELET - Abnormal;  Notable for the following components:      Result Value   Hemoglobin 10.6 (*)    HCT 34.6 (*)    Neutro Abs 8.5 (*)    All other components within normal limits  COMPREHENSIVE METABOLIC PANEL - Abnormal; Notable for the following components:   Chloride 93 (*)    CO2 18 (*)    Glucose, Bld 392 (*)    Creatinine, Ser 1.65 (*)    Total Bilirubin 1.6 (*)    GFR calc non Af Amer 35 (*)    GFR calc Af Amer 40 (*)    Anion gap 25 (*)    All other components within normal limits  LACTIC ACID, PLASMA - Abnormal; Notable for the following components:   Lactic Acid, Venous 5.1 (*)    All other components within normal limits  LACTIC ACID, PLASMA - Abnormal; Notable for the following components:   Lactic Acid, Venous 2.1 (*)    All other components within normal limits  BLOOD GAS, VENOUS - Abnormal; Notable for the following components:   pH, Ven 7.597 (*)    pCO2, Ven 20.7 (*)    All other components within normal limits  BETA-HYDROXYBUTYRIC ACID - Abnormal; Notable for the following components:   Beta-Hydroxybutyric Acid 6.06 (*)    All other components within normal limits  CBG MONITORING, ED - Abnormal; Notable for the following components:   Glucose-Capillary 372 (*)    All other components within normal limits  CBG MONITORING, ED - Abnormal; Notable for the following components:   Glucose-Capillary 342 (*)    All other components within normal limits  CBG MONITORING, ED - Abnormal; Notable for the following components:   Glucose-Capillary 296 (*)    All other components within normal limits  CBG MONITORING, ED - Abnormal; Notable for the following components:   Glucose-Capillary 183 (*)    All other components within normal limits  TROPONIN I  (HIGH SENSITIVITY) - Abnormal; Notable for the following components:   Troponin I (High Sensitivity) 21 (*)    All other components within normal limits  SARS CORONAVIRUS 2 (TAT 6-24 HRS)  LIPASE, BLOOD  URINALYSIS, ROUTINE W REFLEX MICROSCOPIC  TROPONIN I (HIGH SENSITIVITY)    EKG  EKG Interpretation  Date/Time:  Saturday June 09 2019 23:38:33 EDT Ventricular Rate:  133 PR Interval:    QRS Duration: 74 QT Interval:  306 QTC Calculation: 456 R Axis:   -39 Text Interpretation: Sinus tach. Nonspecific t-wave abnormalities       Radiology CT ABDOMEN PELVIS WO CONTRAST  Result Date: 06/10/2019 CLINICAL DATA:  Nausea and vomiting. EXAM: CT ABDOMEN AND PELVIS WITHOUT CONTRAST TECHNIQUE: Multidetector CT imaging of the abdomen and pelvis was performed following the standard protocol without IV contrast. COMPARISON:  May 27, 2019 FINDINGS: Lower chest: The lung bases are clear. The heart size is normal. Hepatobiliary: There is decreased hepatic attenuation suggestive of hepatic steatosis. Status post cholecystectomy.There is no biliary ductal dilation. Pancreas: Normal contours without ductal dilatation. No peripancreatic fluid collection. Spleen: Unremarkable. Adrenals/Urinary Tract: --Adrenal glands: Unremarkable. --Right kidney/ureter: No hydronephrosis or radiopaque kidney stones. --Left kidney/ureter: No hydronephrosis or radiopaque kidney stones. --Urinary bladder: Unremarkable. Stomach/Bowel: --Stomach/Duodenum: No hiatal hernia or other gastric abnormality. Normal duodenal course and caliber. --Small bowel: Unremarkable. --Colon: Unremarkable. --Appendix: Normal. Vascular/Lymphatic: Atherosclerotic calcification is present within the non-aneurysmal abdominal aorta, without hemodynamically significant stenosis. --No retroperitoneal lymphadenopathy. --No mesenteric lymphadenopathy. --No pelvic or inguinal lymphadenopathy. Reproductive: A dominant uterine fibroid is again noted  measuring  approximately 5.1 cm. Other: No ascites or free air. The abdominal wall is normal. Musculoskeletal. No acute displaced fractures. IMPRESSION: 1. No CT findings to explain the patient's nausea and vomiting. 2. Hepatic steatosis. 3. Aortic Atherosclerosis (ICD10-I70.0). Electronically Signed   By: Constance Holster M.D.   On: 06/10/2019 02:50   DG Chest Port 1 View  Result Date: 06/10/2019 CLINICAL DATA:  [name] EXAM: PORTABLE CHEST 1 VIEW COMPARISON:  06/05/2019 FINDINGS: Cardiac shadow is enlarged but stable. Lungs are well aerated bilaterally. No focal infiltrate or sizable effusion is seen. Previously seen vascular congestion has improved. No bony abnormality is noted. IMPRESSION: No acute abnormality noted. Electronically Signed   By: Inez Catalina M.D.   On: 06/10/2019 00:51    Procedures .Critical Care Performed by: Franchot Heidelberg, PA-C Authorized by: Franchot Heidelberg, PA-C   Critical care provider statement:    Critical care time (minutes):  50   Critical care time was exclusive of:  Separately billable procedures and treating other patients and teaching time   Critical care was necessary to treat or prevent imminent or life-threatening deterioration of the following conditions:  Endocrine crisis   Critical care was time spent personally by me on the following activities:  Blood draw for specimens, development of treatment plan with patient or surrogate, evaluation of patient's response to treatment, examination of patient, obtaining history from patient or surrogate, ordering and performing treatments and interventions, ordering and review of laboratory studies, ordering and review of radiographic studies, pulse oximetry, re-evaluation of patient's condition and review of old charts   I assumed direction of critical care for this patient from another provider in my specialty: no   Comments:     Patient presenting with DKA.  Requiring insulin drip and admission to the  hospital.   (including critical care time)  Medications Ordered in ED Medications  insulin regular, human (MYXREDLIN) 100 units/ 100 mL infusion (2 Units/hr Intravenous Rate/Dose Change 06/10/19 0422)  0.9 %  sodium chloride infusion ( Intravenous Stopped 06/10/19 0423)  dextrose 5 %-0.45 % sodium chloride infusion ( Intravenous New Bag/Given 06/10/19 0425)  dextrose 50 % solution 0-50 mL (has no administration in time range)  potassium chloride 10 mEq in 100 mL IVPB (has no administration in time range)  metoCLOPramide (REGLAN) injection 10 mg (10 mg Intravenous Given 06/10/19 0020)  haloperidol lactate (HALDOL) injection 5 mg (5 mg Intravenous Given 06/10/19 0040)  potassium chloride 10 mEq in 100 mL IVPB (0 mEq Intravenous Stopped 06/10/19 0447)  sodium chloride 0.9 % bolus 500 mL (0 mLs Intravenous Stopped 06/10/19 0327)  HYDROmorphone (DILAUDID) injection 1 mg (1 mg Intravenous Given 06/10/19 0151)    ED Course  I have reviewed the triage vital signs and the nursing notes.  Pertinent labs & imaging results that were available during my care of the patient were reviewed by me and considered in my medical decision making (see chart for details).    MDM Rules/Calculators/A&P                      Patient presenting for evaluation of nausea, vomiting, domino pain, chest pain.  On exam, patient appears chronically ill.  She is tachypneic, speaking in short sentences.  She is generalized abdominal tenderness, multiple episodes of emesis.  Concern for DKA.  Also consider gastroparesis.  Less likely infection, as patient is afebrile.  Consider messenteric/bowel ischemia. Will obtain labs and urine.  Will treat with Reglan.  Will  give a small, 500 cc, fluid bolus, however will be cautious due to patient's history of CHF.  Informed by tech that symptoms are not being controlled with Reglan, will give Haldol. EKG shows sinus tach with nonspecific t wave abnormalities.   Chest x-ray viewed interpreted by me,  no pneumonia pneumothorax or effusion.  Cardiomegaly present/stable.   Labs interpreted by me, concerning for DKA.  Glucose elevated at close to 400.  Bicarb of 18 and gap of 25.  Lactic elevated at five, likely due to DKA.  Doubt sepsis or infection.  VBG has a pH of 7.59.  Will add on beta hydroxybutyric acid to confirm dka.  Will start insulin.  While I have low suspicion for intra-abdominal infection, will obtain CT to ensure no underlying infection causing inferior symptoms.  Troponin mildly elevated, but is close to patient's baseline.  Doubt ACS.  Beta-hydroxybutyric acid elevated at 6, consistent with DKA.  Will call for admission.  Discussed with Dr. Marice Potter from [facility] hospitalist service, patient to be admitted.  Final Clinical Impression(s) / ED Diagnoses Final diagnoses:  Diabetic ketoacidosis without coma associated with type 2 diabetes mellitus [facility])    Rx / DC Orders ED Discharge Orders    None       Franchot Heidelberg, PA-C 06/10/19 0515    Molpus, Jenny Reichmann, MD 06/10/19 [phone]

## 2019-06-10 NOTE — Progress Notes (Signed)
Patient educated on reasoning behind bladder scan, renal US and foley placement. Patient states she does not want a foley and she will get up to void shortly. Patient hesitant to bladder scan but did allow, scan shows 113. Will continue to monitor and bladder scan as needed if no spontaneous voiding. Nephrology to round.

## 2019-06-10 NOTE — Progress Notes (Signed)
Repeat labs showing increased Cr (2.7) as well as hypokalemia. Gap has closed and acidosis and ketosis corrected.   Bladder scanned for 100+ cc and has not made much urine throughout the day despite increased IV fluids.  At bedside patient appears dry  - Switch to SQ insulin with sliding scale - UA - Renal US - Add on Mg  - K repletion - Discussed with Dr. Jonnie Finner, Nephro, recommended inserting foley catheter and giving LR bolus. Nephro will evaluate later today. Will discontinue D5 1/2 NS and change to LR.   Marva Panda, DO

## 2019-06-10 NOTE — H&P (Signed)
[facility] History and Physical  Deborah [name] TGG:[id number] DOB: 1965/12/16 DOA: 06/09/2019  Referring EDP: Junious Silk, PA PCP: [name], DO   Chief Complaint: Abdominal pain with nausea/vomiting  HPI: Deborah [name] is a 54 y.o. female with PMH of morbid obesity, uncontrolled T2DM, HTN, CHF who presented to ED with abdominal pain/nausea/vomiting and admitted for DKA.   Patient reports two days of nausea with vomiting and abdominal pain worsening in the last 24 hours. Reports previous episodes of DKA that were similar. She has been unable to keep much food down for the past 1-2 days. Reports compliance with home medications. Denies urinary frequency or dysuria. Does report feeling SOB. Denies constipation or diarrhea. Abdominal pain is located over entire abdomen, possibly worse in LLQ. She reports that her pain is worse with eating and minimally improved with rest. Denies headache, dizziness, fever, chills, cough, chest pain, diarrhea, constipation, dysuria, hematuria, hematochezia, melena, difficulty moving arms/legs, speech difficulty, confusion or any other complaints.  In the ED: Tachycardic and tachypneic otherwise vitals WNL. Placed on 2L O2 when sleeping. CBC WNL. Na 136, K 3.6, Cl 93, CO2 18, glucose 391, Cr 1.62, TSB 1.6. Lipase WNL. Trop neg x2.  Lactate 5.1>2.1. VBG with pH 7.597. Beta-hydroxy: 6.06 CXR without acute cardiopulmonary abnormality.  CT Abd/Pelv:  IMPRESSION: 1. No CT findings to explain the patient's nausea and vomiting. 2. Hepatic steatosis. 3. Aortic Atherosclerosis (ICD10-I70.0). Patient was started on DKA protocol and admission requested.   Review of Systems:  All other systems negative unless noted above in HPI.   Past Medical History:  Diagnosis Date  . Acute back pain with sciatica, left   . Acute back pain with sciatica, right   . Acute on chronic congestive heart failure ([place])   . AKI (acute kidney injury) ([place])   .  Anemia, unspecified   . Chest pain 12/2015  . Chronic pain   . Chronic systolic CHF (congestive heart failure) ([place])   . Cough   . Diabetes mellitus   . Esophageal reflux   . Fibromyalgia   . Flu-like symptoms 04/05/2019  . Gastric ulcer   . Gastroparesis   . Gout   . Hyperlipidemia   . Hypertension   . Hypokalemia   . Hypomagnesemia   . Lumbosacral stenosis   . NICM (nonischemic cardiomyopathy) ([place])   . Obesity   . PAF (paroxysmal atrial fibrillation) ([place])   . Pneumonia   . Stroke ([place]) 02/2011  . Vitamin B12 deficiency anemia    Past Surgical History:  Procedure Laterality Date  . CATARACT EXTRACTION  01/2014  . CHOLECYSTECTOMY     Social History:  reports that she has never smoked. She has never used smokeless tobacco. She reports that she does not drink alcohol or use drugs.  Allergies  Allergen Reactions  . Contrast Media [Iodinated Diagnostic Agents] Anaphylaxis    Cardiac arrest  . Diazepam [name]  . Isovue [Iopamidol] Anaphylaxis    Patient had seizure like activity and then code post 100 cc of isovue 300  . Lisinopril Anaphylaxis    Tongue and mouth swelling  . Penicillins Palpitations    Has patient had a PCN reaction causing immediate rash, facial/tongue/throat swelling, SOB or lightheadedness with hypotension: Yes, heart races Has patient had a PCN reaction causing severe rash involving mucus membranes or skin necrosis: No Has patient had a PCN reaction that required hospitalization: Yes  Has patient had a PCN reaction occurring within the last 10 years: No   .  Acetaminophen Nausea Only and Other (See Comments)    Irritates stomach ulcer  Abdominal pain  . Tolmetin Nausea Only and Other (See Comments)    ULCER  . Aspirin Other (See Comments)    Irritates stomach ulcer   . Food Hives and Swelling    Reaction to Carrots, ketchup   . Nsaids Other (See Comments)    ULCER  . Tramadol Nausea And Vomiting    Family History  Problem  Relation Age of Onset  . Diabetes Mother   . Diabetes Father   . Heart disease Father   . Diabetes Sister   . Congestive Heart Failure Sister 35  . Diabetes Brother     Prior to Admission medications   Medication Sig Start Date End Date Taking? Authorizing Provider  albuterol (PROVENTIL) (2.5 MG/3ML) 0.083% nebulizer solution Take 3 mLs (2.5 mg total) by nebulization every 6 (six) hours as needed for wheezing or shortness of breath. 04/06/19  Yes Scot Jun, FNP  amLODipine (NORVASC) 10 MG tablet Take 10 mg by mouth every morning. 04/19/19  Yes [provider]  apixaban (ELIQUIS) 5 MG TABS tablet Take 1 tablet (5 mg total) by mouth 2 (two) times daily. 05/09/19  Yes Gifford Shave, MD  aspirin EC 81 MG tablet Take 81 mg by mouth daily.   Yes [provider]  atorvastatin (LIPITOR) 10 MG tablet Take 1 tablet (10 mg total) by mouth daily. 12/13/18  Yes Lockamy, Timothy, DO  blood glucose meter kit and supplies KIT Dispense based on patient and insurance preference. Use up to four times daily as directed. (FOR ICD-9 250.00, 250.01). 12/13/18  Yes Lockamy, Christia Reading, DO  cetirizine (ZYRTEC) 10 MG tablet Take 1 tablet (10 mg total) by mouth daily. 12/13/18  Yes Lockamy, Timothy, DO  DULoxetine HCl 40 MG CPEP TAKE ONE CAPSULE BY MOUTH TWICE DAILY 06/04/19  Yes Lockamy, Timothy, DO  fluticasone (FLONASE) 50 MCG/ACT nasal spray Place 2 sprays into both nostrils daily as needed for allergies or rhinitis. 12/19/18  Yes Rai, Ripudeep K, MD  hydrALAZINE (APRESOLINE) 25 MG tablet TAKE ONE TABLET BY MOUTH THREE TIMES A DAY. FOR BLOOD PRESSURE Patient taking differently: Take 25 mg by mouth 3 (three) times daily. For blood pressure 04/19/19  Yes Lockamy, Timothy, DO  insulin aspart (NOVOLOG FLEXPEN) 100 UNIT/ML FlexPen Inject 30 Units into the skin 4 (four) times daily.   Yes [provider]  LANTUS SOLOSTAR 100 UNIT/ML Solostar Pen Inject 30 Units into the skin at bedtime. Patient  taking differently: Inject 45 Units into the skin at bedtime.  03/17/19  Yes Welborn, Ryan, DO  loperamide (IMODIUM) 2 MG capsule Take 2 mg by mouth 4 (four) times daily as needed for diarrhea or loose stools.   Yes [provider]  Melatonin 3 MG TABS Take 2 tablets (6 mg total) by mouth at bedtime. 12/13/18  Yes Lockamy, Timothy, DO  metoCLOPramide (REGLAN) 10 MG tablet Take 1 tablet (10 mg total) by mouth 3 (three) times daily before meals. 12/19/18  Yes Rai, Ripudeep K, MD  metoprolol succinate (TOPROL-XL) 25 MG 24 hr tablet Take 25 mg by mouth daily. 06/01/19  Yes [provider]  nitroGLYCERIN (NITROSTAT) 0.4 MG SL tablet Place 1 tablet (0.4 mg total) under the tongue every 5 (five) minutes as needed for chest pain. 12/13/18  Yes Lockamy, Timothy, DO  ondansetron (ZOFRAN-ODT) 4 MG disintegrating tablet Take 4 mg by mouth every 8 (eight) hours as needed. 06/07/19  Yes [provider]  pantoprazole (PROTONIX) 40 MG tablet Take 1 tablet (40 mg total) by mouth 2 (two) times daily. 03/17/19 06/10/19 Yes Welborn, Ryan, DO  torsemide (DEMADEX) 20 MG tablet Take 2 tablets (40 mg total) by mouth daily. 06/04/19  Yes Lockamy, Timothy, DO  dicyclomine (BENTYL) 20 MG tablet Take 1 tablet (20 mg total) by mouth 3 (three) times daily as needed for spasms (abdominal cramping). 04/06/19 05/18/19  [name], DO   Physical Exam: Vitals:   06/10/19 0000 06/10/19 0223 06/10/19 0300 06/10/19 0417  BP: (!) 140/111 100/60 101/78 109/84  Pulse: (!) 124 (!) 112 (!) 119 (!) 112  Resp: (!) 26 (!) 25 (!) 23 (!) 22  Temp:      SpO2: 100% 100% 96% 100%    Wt Readings from Last 3 Encounters:  06/04/19 (!) 150.6 kg  05/08/19 (!) 161.5 kg  04/06/19 (!) 161.5 kg    . General:  Sleeping and continually falling asleep during interview. AAOx4. Obese.  . Eyes: EOMI, normal lids, irises & conjunctiva . ENT: grossly normal hearing, lips & tongue . Neck: normal ROM . Cardiovascular: Tachycardic with  regular rhythm, no m/r/g. No LE edema. [name] Respiratory: CTA bilaterally, no w/r/r. Tachypnea present  . Abdomen: Mild generalized tenderness non-localized  . Skin: no rash or induration seen on limited exam . Musculoskeletal: grossly normal tone BUE/BLE . Psychiatric: grossly normal mood and affect, speech fluent and appropriate . Neurologic: grossly non-focal.          Labs on Admission:  Basic Metabolic Panel: Recent Labs  Lab 06/04/19 0958 06/09/19 2350  NA 136 136  K 2.9* 3.6  CL 93* 93*  CO2 22 18*  GLUCOSE 324* 392*  BUN 18 12  CREATININE 1.93* 1.65*  CALCIUM 8.8 9.2  MG 1.2*  --    Liver Function Tests: Recent Labs  Lab 06/09/19 2350  AST 25  ALT 16  ALKPHOS 101  BILITOT 1.6*  PROT 7.8  ALBUMIN 3.7   Recent Labs  Lab 06/09/19 2350  LIPASE 23   No results for input(s): AMMONIA in the last 168 hours. CBC: Recent Labs  Lab 06/09/19 2350  WBC 10.2  NEUTROABS 8.5*  HGB 10.6*  HCT 34.6*  MCV 86.9  PLT 220   Cardiac Enzymes: No results for input(s): CKTOTAL, CKMB, CKMBINDEX, TROPONINI in the last 168 hours.  BNP (last 3 results) Recent Labs    03/12/19 0756 05/07/19 1451  BNP 76.8 127.1*    ProBNP (last 3 results) No results for input(s): PROBNP in the last 8760 hours.  CBG: Recent Labs  Lab 06/09/19 2356 06/10/19 0213 06/10/19 0318 06/10/19 0420  GLUCAP 372* 342* 296* 183*    Radiological Exams on Admission: CT ABDOMEN PELVIS WO CONTRAST  Result Date: 06/10/2019 CLINICAL DATA:  Nausea and vomiting. EXAM: CT ABDOMEN AND PELVIS WITHOUT CONTRAST TECHNIQUE: Multidetector CT imaging of the abdomen and pelvis was performed following the standard protocol without IV contrast. COMPARISON:  May 27, 2019 FINDINGS: Lower chest: The lung bases are clear. The heart size is normal. Hepatobiliary: There is decreased hepatic attenuation suggestive of hepatic steatosis. Status post cholecystectomy.There is no biliary ductal dilation. Pancreas: Normal  contours without ductal dilatation. No peripancreatic fluid collection. Spleen: Unremarkable. Adrenals/Urinary Tract: --Adrenal glands: Unremarkable. --Right kidney/ureter: No hydronephrosis or radiopaque kidney stones. --Left kidney/ureter: No hydronephrosis or radiopaque kidney stones. --Urinary bladder: Unremarkable. Stomach/Bowel: --Stomach/Duodenum: No hiatal hernia or other gastric abnormality. Normal duodenal course and caliber. --Small bowel: Unremarkable. --Colon: Unremarkable. --  Appendix: Normal. Vascular/Lymphatic: Atherosclerotic calcification is present within the non-aneurysmal abdominal aorta, without hemodynamically significant stenosis. --No retroperitoneal lymphadenopathy. --No mesenteric lymphadenopathy. --No pelvic or inguinal lymphadenopathy. Reproductive: A dominant uterine fibroid is again noted measuring approximately 5.1 cm. Other: No ascites or free air. The abdominal wall is normal. Musculoskeletal. No acute displaced fractures. IMPRESSION: 1. No CT findings to explain the patient's nausea and vomiting. 2. Hepatic steatosis. 3. Aortic Atherosclerosis (ICD10-I70.0). Electronically Signed   By: Constance Holster M.D.   On: 06/10/2019 02:50   DG Chest Port 1 View  Result Date: 06/10/2019 CLINICAL DATA:  [name] EXAM: PORTABLE CHEST 1 VIEW COMPARISON:  06/05/2019 FINDINGS: Cardiac shadow is enlarged but stable. Lungs are well aerated bilaterally. No focal infiltrate or sizable effusion is seen. Previously seen vascular congestion has improved. No bony abnormality is noted. IMPRESSION: No acute abnormality noted. Electronically Signed   By: Inez Catalina M.D.   On: 06/10/2019 00:51    EKG: Independently reviewed. HR 130. Sinus tachycardia. QTc 456. No STEMI.   Assessment/Plan Principal Problem:   DKA (diabetic ketoacidoses) (HCC) Active Problems:   Essential hypertension, benign   Uncontrolled type 2 diabetes mellitus with diabetic neuropathy, with long-term current use  of insulin (HCC)   CKD (chronic kidney disease), stage II   Nausea and vomiting   Morbid obesity (HCC)   High risk social situation   Nonalcoholic fatty liver disease  54 y.o. female with PMH of morbid obesity, uncontrolled T2DM, HTN, CHF who presented to ED with abdominal pain/nausea/vomiting and admitted for DKA.   DKA Type 2 diabetes Gastroparesis? - Labs indicative of DKA, CBC hemoconcentrated indicative of dehydration as well  - possibly exacerbated by gastroparesis which is reportedly being worked up outpatient; gastric emptying study pending: Cont Reglan TID, 6 small meals   - Cont DKA protocol and then wean to home meds - Monitor electrolytes closely; BMP q4h - Later this morning can plan to give [place] insulin and then wean off IV insulin in next 2-4 hours and then resume diet - NPO currently  - Last A1c 8.3 in Mar 2021 - Home medications include 45 units/day of Lantus as well as 30 units NovoLog 4 times per day.  - Diabetic education  CKD stage II - Cr 1.65 on admission; baseline ~ 1.3-1.4 - Monitor UOP - Likely small bump due to dehydration - Hold Lasix as fluid rehydrating   CHF - last Echo Nov 2020 with EF 40-45% - Appears hypovolemic currently - Monitor fluid status closely - Patient unsure of weight gain - Restart lasix PRN - I's and O's - Daily weights Wt Readings from Last 3 Encounters:  06/04/19 (!) 150.6 kg  05/08/19 (!) 161.5 kg  04/06/19 (!) 161.5 kg   Hypertension - continue home metoprolol, hydralazine, and amlodipine   Hyperlipidemia - Continue home statin  Chronic Abdominal Pain  - continue home bentyl   Insomnia  - Melatonin 5m qHS  Anxiety/Depression - Continue home duloxetine   GERD - Continue pantoprazole  Code Status: Full  DVT Prophylaxis: Lovenox Family Communication: None Disposition Plan: Admit to inpatient. Patient is at high risk for further decompensation due to age and co-morbidities. Patient requiring IV  medications, IVF and monitoring of labs.  Time spent: 70 minutes  CChauncey Mann MD [facility] Pager [phone]

## 2019-06-11 DIAGNOSIS — N179 Acute kidney failure, unspecified: Secondary | ICD-10-CM

## 2019-06-11 LAB — BASIC METABOLIC PANEL
Anion gap: 11 (ref 5–15)
BUN: 14 mg/dL (ref 6–20)
CO2: 27 mmol/L (ref 22–32)
Calcium: 8 mg/dL — ABNORMAL LOW (ref 8.9–10.3)
Chloride: 99 mmol/L (ref 98–111)
Creatinine, Ser: 2.28 mg/dL — ABNORMAL HIGH (ref 0.44–1.00)
GFR calc Af Amer: 27 mL/min — ABNORMAL LOW (ref >60)
GFR calc non Af Amer: 23 mL/min — ABNORMAL LOW (ref >60)
Glucose, Bld: 194 mg/dL — ABNORMAL HIGH (ref 70–99)
Potassium: 3.3 mmol/L — ABNORMAL LOW (ref 3.5–5.1)
Sodium: 137 mmol/L (ref 135–145)

## 2019-06-11 LAB — CBC
HCT: 32.7 % — ABNORMAL LOW (ref 36.0–46.0)
Hemoglobin: 9.6 g/dL — ABNORMAL LOW (ref 12.0–15.0)
MCH: 26.7 pg (ref 26.0–34.0)
MCHC: 29.4 g/dL — ABNORMAL LOW (ref 30.0–36.0)
MCV: 90.8 fL (ref 80.0–100.0)
Platelets: 397 10*3/uL (ref 150–400)
RBC: 3.6 MIL/uL — ABNORMAL LOW (ref 3.87–5.11)
RDW: 14.9 % (ref 11.5–15.5)
WBC: 10.3 10*3/uL (ref 4.0–10.5)
nRBC: 0 % (ref 0.0–0.2)

## 2019-06-11 LAB — GLUCOSE, CAPILLARY
Glucose-Capillary: 190 mg/dL — ABNORMAL HIGH (ref 70–99)
Glucose-Capillary: 202 mg/dL — ABNORMAL HIGH (ref 70–99)
Glucose-Capillary: 236 mg/dL — ABNORMAL HIGH (ref 70–99)
Glucose-Capillary: 318 mg/dL — ABNORMAL HIGH (ref 70–99)
Glucose-Capillary: 167 mg/dL — ABNORMAL HIGH (ref 70–99)

## 2019-06-11 LAB — MAGNESIUM: Magnesium: 1.1 mg/dL — ABNORMAL LOW (ref 1.7–2.4)

## 2019-06-11 MED ORDER — SODIUM CHLORIDE 0.9 % IV SOLN: INTRAVENOUS | Status: AC

## 2019-06-11 MED ORDER — SODIUM CHLORIDE 0.9 % IV BOLUS
1000.0000 mL | Freq: Once | INTRAVENOUS | Status: AC
  Administered 2019-06-11: 1000 mL via INTRAVENOUS

## 2019-06-11 MED ORDER — CHLORHEXIDINE GLUCONATE CLOTH 2 % EX PADS
6.0000 | MEDICATED_PAD | Freq: Every day | CUTANEOUS | Status: DC
  Administered 2019-06-11 – 2019-06-14 (×2): 6 via TOPICAL

## 2019-06-11 MED ORDER — INSULIN ASPART 100 UNIT/ML ~~LOC~~ SOLN
6.0000 [IU] | Freq: Three times a day (TID) | SUBCUTANEOUS | Status: DC
  Administered 2019-06-11 – 2019-06-13 (×5): 6 [IU] via SUBCUTANEOUS

## 2019-06-11 MED ORDER — MAGNESIUM SULFATE 4 GM/100ML IV SOLN
4.0000 g | Freq: Once | INTRAVENOUS | Status: AC
  Administered 2019-06-11: 4 g via INTRAVENOUS
  Filled 2019-06-11: qty 100

## 2019-06-11 MED ORDER — POTASSIUM CHLORIDE CRYS ER 20 MEQ PO TBCR
40.0000 meq | EXTENDED_RELEASE_TABLET | Freq: Once | ORAL | Status: AC
  Administered 2019-06-11: 40 meq via ORAL
  Filled 2019-06-11: qty 2

## 2019-06-11 NOTE — Telephone Encounter (Signed)
Called pt and she is actually in the hospital at [facility]. Please advise. Deborah [name] Kennon Holter, CMA

## 2019-06-11 NOTE — Progress Notes (Signed)
[facility] Progress Note  Subjective: creat down 2.2 today, no UOP recorded. Pt voided " a couple times" last night. Eating and drinking, feeling better, BS's better.   Vitals:   06/11/19 1000 06/11/19 1100 06/11/19 1200 06/11/19 1227  BP:   121/68 129/84  Pulse: 95 88 89 86  Resp: 19 18 (!) 22   Temp:   98.5 F (36.9 C) 97.7 F (36.5 C)  TempSrc:   Oral Oral  SpO2: 98% 97% 100% 100%  Weight:    (!) 148.1 kg  Height:    '5\' 6"'  (1.676 m)    Exam: Gen obese AAF, up in the room No rash, cyanosis or gangrene Sclera anicteric, throat slightly dry No jvd or bruits Chest clear bilat  RRR no MRG Abd soft ntnd no mass or ascites MS no joint effusions or deformity Ext no pretib or hip edema Neuro is alert, Ox 3 , nf    Home meds:  - norvasc 10/ torsemide 40 qd/ hydralazine 25 tid/ metoprolol xl 25 qd  - sl ntg prn/ aspirin 81/ lipitor 10  - lantus 30u hs  - eliquis 5 bid  - duloxetine 40 bid  - protonoix 40 bid/ reglan 10 tid  - prn's/ vitamins/ supplements    Date               Creat               eGFR   2011- 16        0.80- 1.00   2017- 19        1.00- 1.41   2020              1.30- 1.70   Jan 2021       1.34- 2.17        25-45   Mar 2021       1.29- 1.41        42- 47   06/09/19            1.65                 35, CKD III   06/10/19            2.0 > 2.4              UA 4/4 -- many bact, 21-50 wbc/ 11-20 rbc, >300  prot    UNa - 57    CT abd / pelv 06/10/19 > normal appearing kidneys w/o hydro or stones    Renal US  12/12 cm kidneys, no hydro, normal echo   Assessment/ Plan: 1. AoCKD 3 - baseline creat 1.4- 1.7.  Creat 1.6 on admit last night, up to 2.7 on 4/4. Seems to be responding to volume expansion w/ creat down 2 2 today. Suspect vol depletion the main issue.  No contrast/ nsaid/ acei/ ARB given. Proteinuria most c/w diab neph underlying CKD.  Resume IVF's for another 24 hrs or so.  2. DKA - per primary team 3. CM/ syst CHF - no signs of vol  overload 4. HTN - BP's wnl, cont home meds  5. Abd pain - per primary, CT abd negative      Rob Mattie Nordell 06/11/2019, 3:51 PM   Recent Labs  Lab 06/09/19 2350 06/10/19 0625 06/10/19 1842 06/11/19 0740  K 3.6   < > 3.1* 3.3*  BUN 12   < > 14 14  CREATININE 1.65*   < > 2.47* 2.28*  CALCIUM 9.2   < > 8.0* 8.0*  HGB 10.6*  --   --  9.6*   < > = values in this interval not displayed.   Inpatient medications: . amLODipine  10 mg Oral q morning - 10a  . apixaban  5 mg Oral BID  . atorvastatin  10 mg Oral q1800  . Chlorhexidine Gluconate Cloth  6 each Topical Daily  . DULoxetine  40 mg Oral Daily  . hydrALAZINE  25 mg Oral TID  . insulin aspart  0-5 Units Subcutaneous QHS  . insulin aspart  0-9 Units Subcutaneous TID WC  . insulin aspart  6 Units Subcutaneous TID WC  . insulin glargine  30 Units Subcutaneous QHS  . melatonin  6 mg Oral QHS  . metoCLOPramide (REGLAN) injection  10 mg Intravenous Q8H  . metoprolol succinate  25 mg Oral Daily  . pantoprazole  40 mg Oral BID   . insulin Stopped (06/10/19 2022)   albuterol, dextrose, dicyclomine, HYDROmorphone (DILAUDID) injection, nitroGLYCERIN

## 2019-06-11 NOTE — Telephone Encounter (Signed)
-----   Message from [name], DO sent at 06/07/2019  4:26 PM EDT ----- Regarding: Lab Appt Can we call her and let her know to come in next week to repeat some lab work to see how her kidney function is doing? Thanks!

## 2019-06-11 NOTE — Progress Notes (Signed)
Inpatient Diabetes Program Recommendations  AACE/ADA: New Consensus Statement on Inpatient Glycemic Control (2015)  Target Ranges:  Prepandial:   less than 140 mg/dL      Peak postprandial:   less than 180 mg/dL (1-2 hours)      Critically ill patients:  140 - 180 mg/dL   Lab Results  Component Value Date   GLUCAP 190 (H) 06/11/2019   HGBA1C 8.3 (H) 05/07/2019    Review of Glycemic Control  Diabetes history: DM2 Outpatient Diabetes medications:  Current orders for Inpatient glycemic control: Lantus 30 units QHS, Novolog 0-9 units tidwc and 0-5 units QHS.  HgbA1C - 8.3% - sub-par glycemic control.  Inpatient Diabetes Program Recommendations:     Consider adding meal coverage insulin - Novolog 6 units tidwc, if pt eats > 50% meal.   Will speak with pt regarding her HgbA1C results and improving diabetes control at home.   Follow closely.  Thank you. Lorenda Peck, RD, LDN, CDE Inpatient Diabetes Coordinator [phone]

## 2019-06-11 NOTE — Progress Notes (Addendum)
PROGRESS NOTE    Deborah [name]    Code Status: Full Code  PXT:[id number] DOB: 09-05-1965 DOA: 06/09/2019 LOS: 1 days  PCP: [name], DO CC:  Chief Complaint  Patient presents with  . [name]       Hospital Summary   This is a 54 year old female with history of morbid obesity, poorly controlled type 2 diabetes, hypertension, HFrEF EF 40 to 45% in 2020 admitted for DKA.  Nephrology consulted for AKI and patient was believed to be severely volume depleted with improved creatinine with LR.  Transferred to med telemetry on 4/5 after anion gap and acidosis corrected.  And off insulin drip  A & P   Principal Problem:   DKA (diabetic ketoacidoses) (HCC) Active Problems:   Essential hypertension, benign   Uncontrolled type 2 diabetes mellitus with diabetic neuropathy, with long-term current use of insulin (HCC)   CKD (chronic kidney disease), stage II   Nausea and vomiting   Morbid obesity (HCC)   High risk social situation   Nonalcoholic fatty liver disease    1. DKA with poorly controlled type 2 diabetes a. HA1C 8.3 b. DKA resolved c. Continue Lantus 30 units at bedtime, NovoLog 6 units 3 times daily with meals, sensitive sliding scale 3 times daily with meals and night sliding scale d. Diabetic coordinator on board  2. AKI on CKD 3 Secondary to severe volume depletion a. Improving but not resolved with IV fluids b. Renal ultrasound unremarkable c. Renally dose medications, will decrease Cymbalta for this reason d. Continue IV fluids for the next 24 hours or so per nephrology  3. Hypokalemia/hypomagnesemia a. Replete as necessary  4. Compensated HFrEF a. Echo 01/26/2019: EF 40 to 45%, decreased from 2019 with global hypokinesis and small pericardial effusion.   b. Need to monitor volume status closely she has been getting high volumes of fluid c. Daily weights and I/O  5. Noncardiac chest pain, likely musculoskeletal from vomiting with component  of GERD a. Minimally elevated troponin in setting of CKD and elevated at baseline b. Protonix  6. Acute on chronic abdominal pain likely secondary to DKA and musculoskeletal from vomiting with possible component of gastroparesis a. CT abdomen pelvis unremarkable b. Continue current pain regimen and treat underlying issue c. Being treated for gastroparesis at resident clinic  7. Hypertension on home metoprolol, hydralazine and amlodipine 8. Hyperlipidemia on home statin 9. Insomnia on melatonin nightly 10. Anemia of CKD stable 11. Atrial fibrillation on Eliquis 12. Anxiety/depression with changes to home Cymbalta as above 13. GERD on Protonix  DVT prophylaxis: eliquis Family Communication: No family at bedside Disposition Plan:   Patient came from:   Home                                                                                          Anticipated d/c place: Home  Barriers to d/c: Needs improved renal function, hopeful discharge in 24 to 48 hours  Pressure injury documentation    None  Consultants  Nephrology   Procedures  None  Antibiotics   Anti-infectives (From admission, onward)   None  Subjective   Seen and examined at bedside in stepdown unit in no acute distress and resting comfortably.  States she does have some abdominal pain and persistent chest pain which has improved.  She is feeling better today but not at baseline.  No other complaints.  Objective   Vitals:   06/11/19 1000 06/11/19 1100 06/11/19 1200 06/11/19 1227  BP:   121/68 129/84  Pulse: 95 88 89 86  Resp: 19 18 (!) 22   Temp:   98.5 F (36.9 C) 97.7 F (36.5 C)  TempSrc:   Oral Oral  SpO2: 98% 97% 100% 100%  Weight:    (!) 148.1 kg  Height:    5\' 6"  (1.676 m)    Intake/Output Summary (Last 24 hours) at 06/11/2019 1813 Last data filed at 06/11/2019 1800 Gross per 24 hour  Intake 3243.32 ml  Output --  Net 3243.32 ml   Filed Weights   06/10/19 0500 06/10/19 0856  06/11/19 1227  Weight: (!) 139.4 kg (!) 139.4 kg (!) 148.1 kg    Examination:  Physical Exam Vitals and nursing note reviewed.  Constitutional:      Appearance: Normal appearance. She is obese. She is not diaphoretic.  HENT:     Head: Normocephalic and atraumatic.  Eyes:     Conjunctiva/sclera: Conjunctivae normal.  Cardiovascular:     Rate and Rhythm: Normal rate and regular rhythm.     Comments: Reproducible chest pain to palpation Pulmonary:     Effort: Pulmonary effort is normal.     Breath sounds: Normal breath sounds.  Abdominal:     General: Abdomen is flat.     Palpations: Abdomen is soft.     Tenderness: There is generalized abdominal tenderness.  Musculoskeletal:        General: No swelling or tenderness.  Skin:    Coloration: Skin is not jaundiced or pale.  Neurological:     Mental Status: She is alert. Mental status is at baseline.  Psychiatric:        Mood and Affect: Mood normal.        Behavior: Behavior normal.     Data Reviewed: I have personally reviewed following labs and imaging studies  CBC: Recent Labs  Lab 06/09/19 2350 06/11/19 0740  WBC 10.2 10.3  NEUTROABS 8.5*  --   HGB 10.6* 9.6*  HCT 34.6* 32.7*  MCV 86.9 90.8  PLT 220 110   Basic Metabolic Panel: Recent Labs  Lab 06/10/19 0625 06/10/19 0932 06/10/19 1412 06/10/19 1842 06/11/19 0740  NA 140 141 139 139 137  K 3.2* 3.7 3.0* 3.1* 3.3*  CL 99 100 99 99 99  CO2 28 29 30 27 27   GLUCOSE 202* 184* 157* 202* 194*  BUN 14 14 15 14 14   CREATININE 2.05* 2.35* 2.70* 2.47* 2.28*  CALCIUM 8.6* 8.4* 8.2* 8.0* 8.0*  MG  --   --   --  1.1* 1.1*   GFR: Estimated Creatinine Clearance: 41.7 mL/min (A) (by C-G formula based on SCr of 2.28 mg/dL (H)). Liver Function Tests: Recent Labs  Lab 06/09/19 2350  AST 25  ALT 16  ALKPHOS 101  BILITOT 1.6*  PROT 7.8  ALBUMIN 3.7   Recent Labs  Lab 06/09/19 2350  LIPASE 23   No results for input(s): AMMONIA in the last 168  hours. Coagulation Profile: No results for input(s): INR, PROTIME in the last 168 hours. Cardiac Enzymes: No results for input(s): CKTOTAL, CKMB, CKMBINDEX, TROPONINI in the last 168 hours.  BNP (last 3 results) No results for input(s): PROBNP in the last 8760 hours. HbA1C: No results for input(s): HGBA1C in the last 72 hours. CBG: Recent Labs  Lab 06/10/19 2108 06/11/19 0803 06/11/19 1145 06/11/19 1236 06/11/19 1707  GLUCAP 135* 190* 202* 236* 318*   Lipid Profile: No results for input(s): CHOL, HDL, LDLCALC, TRIG, CHOLHDL, LDLDIRECT in the last 72 hours. Thyroid Function Tests: No results for input(s): TSH, T4TOTAL, FREET4, T3FREE, THYROIDAB in the last 72 hours. Anemia Panel: No results for input(s): VITAMINB12, FOLATE, FERRITIN, TIBC, IRON, RETICCTPCT in the last 72 hours. Sepsis Labs: Recent Labs  Lab 06/09/19 2350 06/10/19 0147 06/10/19 0625  LATICACIDVEN 5.1* 2.1* 2.1*    Recent Results (from the past 240 hour(s))  SARS CORONAVIRUS 2 (TAT 6-24 HRS) Nasopharyngeal Nasopharyngeal Swab     Status: None   Collection Time: 06/10/19  1:47 AM   Specimen: Nasopharyngeal Swab  Result Value Ref Range Status   SARS Coronavirus 2 NEGATIVE NEGATIVE Final    Comment: (NOTE) SARS-CoV-2 target nucleic acids are NOT DETECTED. The SARS-CoV-2 RNA is generally detectable in upper and lower respiratory specimens during the acute phase of infection. Negative results do not preclude SARS-CoV-2 infection, do not rule out co-infections with other pathogens, and should not be used as the sole basis for treatment or other patient management decisions. Negative results must be combined with clinical observations, patient history, and epidemiological information. The expected result is Negative. Fact Sheet for Patients: SugarRoll.be Fact Sheet for Healthcare Providers: https://www.woods-mathews.com/ This test is not yet approved or cleared by the  [place] FDA and  has been authorized for detection and/or diagnosis of SARS-CoV-2 by FDA under an Emergency Use Authorization (EUA). This EUA will remain  in effect (meaning this test can be used) for the duration of the COVID-19 declaration under Section 56 4(b)(1) of the Act, 21 U.S.C. section 360bbb-3(b)(1), unless the authorization is terminated or revoked sooner. Performed at [facility], [place] [address], [place], [place] [address]   MRSA PCR Screening     Status: None   Collection Time: 06/10/19  5:46 AM   Specimen: Nasal Mucosa; Nasopharyngeal  Result Value Ref Range Status   MRSA by PCR NEGATIVE NEGATIVE Final    Comment:        The GeneXpert MRSA Assay (FDA approved for NASAL specimens only), is one component of a comprehensive MRSA colonization surveillance program. It is not intended to diagnose MRSA infection nor to guide or monitor treatment for MRSA infections. Performed at [facility], [place] [address], [place], [place] [address]          Radiology Studies: CT ABDOMEN PELVIS WO CONTRAST  Result Date: 06/10/2019 CLINICAL DATA:  Nausea and vomiting. EXAM: CT ABDOMEN AND PELVIS WITHOUT CONTRAST TECHNIQUE: Multidetector CT imaging of the abdomen and pelvis was performed following the standard protocol without IV contrast. COMPARISON:  May 27, 2019 FINDINGS: Lower chest: The lung bases are clear. The heart size is normal. Hepatobiliary: There is decreased hepatic attenuation suggestive of hepatic steatosis. Status post cholecystectomy.There is no biliary ductal dilation. Pancreas: Normal contours without ductal dilatation. No peripancreatic fluid collection. Spleen: Unremarkable. Adrenals/Urinary Tract: --Adrenal glands: Unremarkable. --Right kidney/ureter: No hydronephrosis or radiopaque kidney stones. --Left kidney/ureter: No hydronephrosis or radiopaque kidney stones. --Urinary bladder: Unremarkable. Stomach/Bowel: --Stomach/Duodenum: No  hiatal hernia or other gastric abnormality. Normal duodenal course and caliber. --Small bowel: Unremarkable. --Colon: Unremarkable. --Appendix: Normal. Vascular/Lymphatic: Atherosclerotic calcification is present within the non-aneurysmal abdominal aorta,  without hemodynamically significant stenosis. --No retroperitoneal lymphadenopathy. --No mesenteric lymphadenopathy. --No pelvic or inguinal lymphadenopathy. Reproductive: A dominant uterine fibroid is again noted measuring approximately 5.1 cm. Other: No ascites or free air. The abdominal wall is normal. Musculoskeletal. No acute displaced fractures. IMPRESSION: 1. No CT findings to explain the patient's nausea and vomiting. 2. Hepatic steatosis. 3. Aortic Atherosclerosis (ICD10-I70.0). Electronically Signed   By: Constance Holster M.D.   On: 06/10/2019 02:50   US RENAL  Result Date: 06/10/2019 CLINICAL DATA:  AKI EXAM: RENAL / URINARY TRACT ULTRASOUND COMPLETE COMPARISON:  None. FINDINGS: Right Kidney: Renal measurements: 12 x 4.8 x 5.3 cm = volume: 160.6 mL . Echogenicity within normal limits. No mass or hydronephrosis visualized. Left Kidney: Renal measurements: 12 x 5.9 x 4.9 cm = volume: 22 mL. Echogenicity within normal limits. No mass or hydronephrosis visualized. Bladder: Appears normal for degree of bladder distention. Other: Increased liver echogenicity likely reflecting steatosis. IMPRESSION: No hydronephrosis. Electronically Signed   By: Macy Mis M.D.   On: 06/10/2019 16:28   DG Chest Port 1 View  Result Date: 06/10/2019 CLINICAL DATA:  [name] EXAM: PORTABLE CHEST 1 VIEW COMPARISON:  06/05/2019 FINDINGS: Cardiac shadow is enlarged but stable. Lungs are well aerated bilaterally. No focal infiltrate or sizable effusion is seen. Previously seen vascular congestion has improved. No bony abnormality is noted. IMPRESSION: No acute abnormality noted. Electronically Signed   By: Inez Catalina M.D.   On: 06/10/2019 00:51         Scheduled Meds: . amLODipine  10 mg Oral q morning - 10a  . apixaban  5 mg Oral BID  . atorvastatin  10 mg Oral q1800  . Chlorhexidine Gluconate Cloth  6 each Topical Daily  . DULoxetine  40 mg Oral Daily  . hydrALAZINE  25 mg Oral TID  . insulin aspart  0-5 Units Subcutaneous QHS  . insulin aspart  0-9 Units Subcutaneous TID WC  . insulin aspart  6 Units Subcutaneous TID WC  . insulin glargine  30 Units Subcutaneous QHS  . melatonin  6 mg Oral QHS  . metoCLOPramide (REGLAN) injection  10 mg Intravenous Q8H  . metoprolol succinate  25 mg Oral Daily  . pantoprazole  40 mg Oral BID   Continuous Infusions: . sodium chloride 100 mL/hr at 06/11/19 1735  . insulin Stopped (06/10/19 2022)     Time spent: 28 minutes with over 50% of the time coordinating the patient's care    Harold Hedge, DO [facility] Pager [phone]  Call night coverage person covering after 7pm j

## 2019-06-12 LAB — CBC
HCT: 28.3 % — ABNORMAL LOW (ref 36.0–46.0)
Hemoglobin: 8.3 g/dL — ABNORMAL LOW (ref 12.0–15.0)
MCH: 26.9 pg (ref 26.0–34.0)
MCHC: 29.3 g/dL — ABNORMAL LOW (ref 30.0–36.0)
MCV: 91.9 fL (ref 80.0–100.0)
Platelets: 343 10*3/uL (ref 150–400)
RBC: 3.08 MIL/uL — ABNORMAL LOW (ref 3.87–5.11)
RDW: 14.8 % (ref 11.5–15.5)
WBC: 9.7 10*3/uL (ref 4.0–10.5)
nRBC: 0 % (ref 0.0–0.2)

## 2019-06-12 LAB — BASIC METABOLIC PANEL
Anion gap: 11 (ref 5–15)
BUN: 15 mg/dL (ref 6–20)
CO2: 24 mmol/L (ref 22–32)
Calcium: 7.8 mg/dL — ABNORMAL LOW (ref 8.9–10.3)
Chloride: 102 mmol/L (ref 98–111)
Creatinine, Ser: 1.85 mg/dL — ABNORMAL HIGH (ref 0.44–1.00)
GFR calc Af Amer: 35 mL/min — ABNORMAL LOW (ref >60)
GFR calc non Af Amer: 30 mL/min — ABNORMAL LOW (ref >60)
Glucose, Bld: 210 mg/dL — ABNORMAL HIGH (ref 70–99)
Potassium: 3.4 mmol/L — ABNORMAL LOW (ref 3.5–5.1)
Sodium: 137 mmol/L (ref 135–145)

## 2019-06-12 LAB — GLUCOSE, CAPILLARY
Glucose-Capillary: 201 mg/dL — ABNORMAL HIGH (ref 70–99)
Glucose-Capillary: 280 mg/dL — ABNORMAL HIGH (ref 70–99)
Glucose-Capillary: 242 mg/dL — ABNORMAL HIGH (ref 70–99)
Glucose-Capillary: 211 mg/dL — ABNORMAL HIGH (ref 70–99)

## 2019-06-12 LAB — MAGNESIUM: Magnesium: 1.8 mg/dL (ref 1.7–2.4)

## 2019-06-12 MED ORDER — LIDOCAINE VISCOUS HCL 2 % MT SOLN
15.0000 mL | Freq: Once | OROMUCOSAL | Status: DC
  Filled 2019-06-12: qty 15

## 2019-06-12 MED ORDER — TORSEMIDE 20 MG PO TABS
40.0000 mg | ORAL_TABLET | Freq: Every day | ORAL | Status: DC
  Administered 2019-06-13 – 2019-06-14 (×2): 40 mg via ORAL
  Filled 2019-06-12 (×3): qty 2

## 2019-06-12 MED ORDER — HYDROMORPHONE HCL 1 MG/ML IJ SOLN
1.0000 mg | INTRAMUSCULAR | Status: DC | PRN
  Administered 2019-06-13 – 2019-06-14 (×9): 1 mg via INTRAVENOUS
  Filled 2019-06-12 (×9): qty 1

## 2019-06-12 MED ORDER — POTASSIUM CHLORIDE CRYS ER 20 MEQ PO TBCR
20.0000 meq | EXTENDED_RELEASE_TABLET | Freq: Once | ORAL | Status: AC
  Administered 2019-06-12: 20 meq via ORAL
  Filled 2019-06-12: qty 1

## 2019-06-12 MED ORDER — MAGNESIUM SULFATE 2 GM/50ML IV SOLN
2.0000 g | Freq: Once | INTRAVENOUS | Status: AC
  Administered 2019-06-12: 2 g via INTRAVENOUS
  Filled 2019-06-12: qty 50

## 2019-06-12 MED ORDER — ALUM & MAG HYDROXIDE-SIMETH 200-200-20 MG/5ML PO SUSP
30.0000 mL | Freq: Once | ORAL | Status: DC
  Filled 2019-06-12: qty 30

## 2019-06-12 NOTE — Progress Notes (Signed)
[facility] Progress Note  Subjective: creat down 1.8 today. C/o swelling in legs > arms since admitted.  Vitals:   06/11/19 2138 06/12/19 0212 06/12/19 0621 06/12/19 1153  BP: 126/71 (!) 112/52 (!) 144/87 135/81  Pulse: 89 90 91 81  Resp: _0 Temp: 98.4 F (36.9 C) 97.8 F (36.6 C) (!) 97.5 F (36.4 C) 97.8 F (36.6 C)  TempSrc: Oral   Oral  SpO2: 96% 98% 100% 99%  Weight:      Height:        Exam: Gen obese AAF, up in the room No rash, cyanosis or gangrene Sclera anicteric, throat slightly dry No jvd or bruits Chest clear bilat  RRR no MRG Abd soft ntnd no mass or ascites MS no joint effusions or deformity Ext some nonpitting edema of bilatLE > UE's Neuro is alert, Ox 3 , nf    Home meds:  - norvasc 10/ torsemide 40 qd/ hydralazine 25 tid/ metoprolol xl 25 qd  - sl ntg prn/ aspirin 81/ lipitor 10  - lantus 30u hs  - eliquis 5 bid  - duloxetine 40 bid  - protonoix 40 bid/ reglan 10 tid  - prn's/ vitamins/ supplements    Date               Creat               eGFR   2011- 16        0.80- 1.00   2017- 19        1.00- 1.41   2020              1.30- 1.70   Jan 2021       1.34- 2.17        25-45   Mar 2021       1.29- 1.41        42- 47   06/09/19            1.65                 35, CKD III   06/10/19            2.0 > 2.4              UA 4/4 -- many bact, 21-50 wbc/ 11-20 rbc, >300  prot    UNa - 57    CT abd / pelv 06/10/19 > normal appearing kidneys w/o hydro or stones    Renal US  12/12 cm kidneys, no hydro, normal echo   Assessment/ Plan: 1. AoCKD 3 - baseline creat 1.4- 1.7.  Creat 1.6 on admit, peaked at 2.7 and now down to 1.8 after IVF's.  No contrast/ nsaid/ acei/ ARB given. Proteinuria most c/w diab neph underlying CKD.  Will contact pt to sched renal hospital f/u appt in 3-4 wks.  Will sign off.  2. Volume - can give her torsemide 40 / d as needed for edema , as she does at home 3. DKA - per primary team 4. CM/ syst CHF - no  signs of vol overload 5. HTN - BP's wnl, cont home meds  6. Abd pain - per primary, CT abd negative      Rob Paislea Hatton 06/12/2019, 1:11 PM   Recent Labs  Lab 06/11/19 0740 06/12/19 0420  K 3.3* 3.4*  BUN 14 15  CREATININE 2.28* 1.85*  CALCIUM 8.0* 7.8*  HGB 9.6* 8.3*   Inpatient medications: . amLODipine  10 mg Oral q morning - 10a  . apixaban  5 mg Oral BID  . atorvastatin  10 mg Oral q1800  . Chlorhexidine Gluconate Cloth  6 each Topical Daily  . DULoxetine  40 mg Oral Daily  . hydrALAZINE  25 mg Oral TID  . insulin aspart  0-5 Units Subcutaneous QHS  . insulin aspart  0-9 Units Subcutaneous TID WC  . insulin aspart  6 Units Subcutaneous TID WC  . insulin glargine  30 Units Subcutaneous QHS  . melatonin  6 mg Oral QHS  . metoCLOPramide (REGLAN) injection  10 mg Intravenous Q8H  . metoprolol succinate  25 mg Oral Daily  . pantoprazole  40 mg Oral BID   . insulin Stopped (06/10/19 2022)   albuterol, dextrose, dicyclomine, HYDROmorphone (DILAUDID) injection, nitroGLYCERIN

## 2019-06-12 NOTE — Progress Notes (Signed)
PROGRESS NOTE    Deborah [name]    Code Status: Full Code  AJG:[id number] DOB: [birth year] DOA: 06/09/2019 LOS: 2 days  PCP: [name], DO CC:  Chief Complaint  Patient presents with  . [name]       Hospital Summary   This is a 54 year old female with history of morbid obesity, poorly controlled type 2 diabetes, hypertension, HFrEF EF 40 to 45% in 2020 admitted for DKA.  Nephrology consulted for AKI and patient was believed to be severely volume depleted with improved creatinine with LR.  Transferred to med telemetry on 4/5 after anion gap and acidosis corrected.  And off insulin drip.  Creatinine continued to improve. Restarted on torsemide by nephro and is to follow up outpatient.   A & P   Principal Problem:   DKA (diabetic ketoacidoses) (HCC) Active Problems:   Essential hypertension, benign   Uncontrolled type 2 diabetes mellitus with diabetic neuropathy, with long-term current use of insulin (HCC)   CKD (chronic kidney disease), stage II   Nausea and vomiting   Morbid obesity (HCC)   High risk social situation   Nonalcoholic fatty liver disease    1. DKA with poorly controlled type 2 diabetes a. HA1C 8.3 b. DKA resolved c. Increase Lantus 36 units at bedtime, NovoLog 6 units 3 times daily with meals, sensitive sliding scale 3 times daily with meals and night sliding scale.  If postprandials> 180 then increase meal coverage to 8 units 3 times daily d. Diabetic coordinator on board e. Will need close follow-up outpatient  2. AKI on CKD 3 Secondary to severe volume depletion a. Continues to improve after receiving IV fluids b. Renal ultrasound unremarkable c. Renally dose medications, will decrease Cymbalta for this reason d. Per nephrology can restart torsemide e. Follow-up with nephro outpatient  3. Hypokalemia/hypomagnesemia a. Replete as necessary  4. Compensated HFrEF a. Echo 01/26/2019: EF 40 to 45%, decreased from 2019 with global  hypokinesis and small pericardial effusion.   b. Need to monitor volume status closely she has been getting high volumes of fluid c. Daily weights and I/O d. Restart torsemide today  5. Noncardiac chest pain, likely musculoskeletal from vomiting with component of GERD a. Minimally elevated troponin in setting of CKD and elevated at baseline b. Protonix  6. Acute on chronic abdominal pain likely secondary to DKA and musculoskeletal from vomiting with possible component of gastroparesis a. Improved now resolved.  States Bentyl does not help -discontinue b. CT abdomen pelvis unremarkable c. Currently on Dilaudid for moderate pain, will change to severe pain d. Add on GI cocktail e. Continue current pain regimen and treat underlying issues as above f. Being treated for gastroparesis at resident clinic  7. Hypertension on home metoprolol, hydralazine and amlodipine 8. Hyperlipidemia on home statin 9. Insomnia on melatonin nightly 10. Anemia of CKD stable 11. Atrial fibrillation on Eliquis 12. Anxiety/depression with changes to home Cymbalta as above 13. GERD on Protonix  DVT prophylaxis: eliquis Family Communication: No family at bedside Disposition Plan:   Patient came from:   Home  Anticipated d/c place: Home  Barriers to d/c: Needs improved renal function and abdominal pain off IV pain meds.  Likely can discharge tomorrow or the next day  Pressure injury documentation    None  Consultants  Nephrology   Procedures  None  Antibiotics   Anti-infectives (From admission, onward)   None        Subjective   States she still has some abdominal pain but it is improving.  Bentyl does not help.  No nausea or vomiting.  States that she feels she is getting volume overloaded now and takes torsemide at home.  No other issues.  Objective   Vitals:   06/11/19 2138 06/12/19 0212 06/12/19  0621 06/12/19 1153  BP: 126/71 (!) 112/52 (!) 144/87 135/81  Pulse: 89 90 91 81  Resp: 18 20 20 16   Temp: 98.4 F (36.9 C) 97.8 F (36.6 C) (!) 97.5 F (36.4 C) 97.8 F (36.6 C)  TempSrc: Oral   Oral  SpO2: 96% 98% 100% 99%  Weight:      Height:        Intake/Output Summary (Last 24 hours) at 06/12/2019 1836 Last data filed at 06/12/2019 1639 Gross per 24 hour  Intake 240 ml  Output 200 ml  Net 40 ml   Filed Weights   06/10/19 0500 06/10/19 0856 06/11/19 1227  Weight: (!) 139.4 kg (!) 139.4 kg (!) 148.1 kg    Examination:  Physical Exam Vitals and nursing note reviewed.  Constitutional:      Appearance: Normal appearance. She is obese. She is not diaphoretic.  HENT:     Head: Normocephalic and atraumatic.  Eyes:     Conjunctiva/sclera: Conjunctivae normal.  Cardiovascular:     Rate and Rhythm: Normal rate and regular rhythm.     Comments: Reproducible chest pain to palpation Pulmonary:     Effort: Pulmonary effort is normal.     Breath sounds: Normal breath sounds.  Abdominal:     General: Abdomen is flat.     Palpations: Abdomen is soft.     Tenderness: There is generalized abdominal tenderness.  Musculoskeletal:        General: No swelling or tenderness.  Skin:    Coloration: Skin is not jaundiced or pale.  Neurological:     Mental Status: She is alert. Mental status is at baseline.  Psychiatric:        Mood and Affect: Mood normal.        Behavior: Behavior normal.     Data Reviewed: I have personally reviewed following labs and imaging studies  CBC: Recent Labs  Lab 06/09/19 2350 06/11/19 0740 06/12/19 0420  WBC 10.2 10.3 9.7  NEUTROABS 8.5*  --   --   HGB 10.6* 9.6* 8.3*  HCT 34.6* 32.7* 28.3*  MCV 86.9 90.8 91.9  PLT 220 397 542   Basic Metabolic Panel: Recent Labs  Lab 06/10/19 0932 06/10/19 1412 06/10/19 1842 06/11/19 0740 06/12/19 0420  NA 141 139 139 137 137  K 3.7 3.0* 3.1* 3.3* 3.4*  CL 100 99 99 99 102  CO2 29 30 27 27 24     GLUCOSE 184* 157* 202* 194* 210*  BUN 14 15 14 14 15   CREATININE 2.35* 2.70* 2.47* 2.28* 1.85*  CALCIUM 8.4* 8.2* 8.0* 8.0* 7.8*  MG  --   --  1.1* 1.1* 1.8   GFR: Estimated Creatinine Clearance: 51.4 mL/min (A) (by C-G formula based on SCr of 1.85 mg/dL (H)). Liver Function Tests: Recent Labs  Lab 06/09/19 2350  AST 25  ALT 16  ALKPHOS 101  BILITOT 1.6*  PROT 7.8  ALBUMIN 3.7   Recent Labs  Lab 06/09/19 2350  LIPASE 23   No results for input(s): AMMONIA in the last 168 hours. Coagulation Profile: No results for input(s): INR, PROTIME in the last 168 hours. Cardiac Enzymes: No results for input(s): CKTOTAL, CKMB, CKMBINDEX, TROPONINI in the last 168 hours. BNP (last 3 results) No results for input(s): PROBNP in the last 8760 hours. HbA1C: No results for input(s): HGBA1C in the last 72 hours. CBG: Recent Labs  Lab 06/11/19 1707 06/11/19 2039 06/12/19 0735 06/12/19 1150 06/12/19 1637  GLUCAP 318* 167* 201* 280* 242*   Lipid Profile: No results for input(s): CHOL, HDL, LDLCALC, TRIG, CHOLHDL, LDLDIRECT in the last 72 hours. Thyroid Function Tests: No results for input(s): TSH, T4TOTAL, FREET4, T3FREE, THYROIDAB in the last 72 hours. Anemia Panel: No results for input(s): VITAMINB12, FOLATE, FERRITIN, TIBC, IRON, RETICCTPCT in the last 72 hours. Sepsis Labs: Recent Labs  Lab 06/09/19 2350 06/10/19 0147 06/10/19 0625  LATICACIDVEN 5.1* 2.1* 2.1*    Recent Results (from the past 240 hour(s))  SARS CORONAVIRUS 2 (TAT 6-24 HRS) Nasopharyngeal Nasopharyngeal Swab     Status: None   Collection Time: 06/10/19  1:47 AM   Specimen: Nasopharyngeal Swab  Result Value Ref Range Status   SARS Coronavirus 2 NEGATIVE NEGATIVE Final    Comment: (NOTE) SARS-CoV-2 target nucleic acids are NOT DETECTED. The SARS-CoV-2 RNA is generally detectable in upper and lower respiratory specimens during the acute phase of infection. Negative results do not preclude SARS-CoV-2  infection, do not rule out co-infections with other pathogens, and should not be used as the sole basis for treatment or other patient management decisions. Negative results must be combined with clinical observations, patient history, and epidemiological information. The expected result is Negative. Fact Sheet for Patients: SugarRoll.be Fact Sheet for Healthcare Providers: https://www.woods-mathews.com/ This test is not yet approved or cleared by the [place] FDA and  has been authorized for detection and/or diagnosis of SARS-CoV-2 by FDA under an Emergency Use Authorization (EUA). This EUA will remain  in effect (meaning this test can be used) for the duration of the COVID-19 declaration under Section 56 4(b)(1) of the Act, 21 U.S.C. section 360bbb-3(b)(1), unless the authorization is terminated or revoked sooner. Performed at [name], [place] [address] [place], [place] [address]   MRSA PCR Screening     Status: None   Collection Time: 06/10/19  5:46 AM   Specimen: Nasal Mucosa; Nasopharyngeal  Result Value Ref Range Status   MRSA by PCR NEGATIVE NEGATIVE Final    Comment:        The GeneXpert MRSA Assay (FDA approved for NASAL specimens only), is one component of a comprehensive MRSA colonization surveillance program. It is not intended to diagnose MRSA infection nor to guide or monitor treatment for MRSA infections. Performed at [facility], [place] [address], [place], [place] [address]          Radiology Studies: No results found.      Scheduled Meds: . amLODipine  10 mg Oral q morning - 10a  . apixaban  5 mg Oral BID  . atorvastatin  10 mg Oral q1800  . Chlorhexidine Gluconate Cloth  6 each Topical Daily  . DULoxetine  40 mg Oral Daily  . hydrALAZINE  25 mg Oral TID  . insulin aspart  0-5 Units Subcutaneous QHS  .  insulin aspart  0-9 Units Subcutaneous TID WC  . insulin aspart  6  Units Subcutaneous TID WC  . insulin glargine  30 Units Subcutaneous QHS  . melatonin  6 mg Oral QHS  . metoCLOPramide (REGLAN) injection  10 mg Intravenous Q8H  . metoprolol succinate  25 mg Oral Daily  . pantoprazole  40 mg Oral BID   Continuous Infusions: . insulin Stopped (06/10/19 2022)     Time spent: 25 minutes with over 50% of the time coordinating the patient's care    Harold Hedge, DO [facility] Pager [phone]  Call night coverage person covering after 7pm j

## 2019-06-12 NOTE — Progress Notes (Signed)
Inpatient Diabetes Program Recommendations  AACE/ADA: New Consensus Statement on Inpatient Glycemic Control (2015)  Target Ranges:  Prepandial:   less than 140 mg/dL      Peak postprandial:   less than 180 mg/dL (1-2 hours)      Critically ill patients:  140 - 180 mg/dL   Lab Results  Component Value Date   GLUCAP 201 (H) 06/12/2019   HGBA1C 8.3 (H) 05/07/2019    Review of Glycemic Control  FBS elevated this am. Continue with basal insulin titration.  Inpatient Diabetes Program Recommendations:     Increase Lantus to 36 units QHS. If post-prandials > 180 mg/dL today, increase meal coverage insulin to 8 units tidwc.  Will check lunchtime CBG. Continue to follow.  Thank you. Lorenda Peck, RD, LDN, CDE Inpatient Diabetes Coordinator [phone]

## 2019-06-13 ENCOUNTER — Encounter: Payer: Self-pay | Admitting: General Practice

## 2019-06-13 DIAGNOSIS — I10 Essential (primary) hypertension: Secondary | ICD-10-CM

## 2019-06-13 DIAGNOSIS — E1140 Type 2 diabetes mellitus with diabetic neuropathy, unspecified: Secondary | ICD-10-CM

## 2019-06-13 DIAGNOSIS — N182 Chronic kidney disease, stage 2 (mild): Secondary | ICD-10-CM

## 2019-06-13 DIAGNOSIS — R112 Nausea with vomiting, unspecified: Secondary | ICD-10-CM

## 2019-06-13 DIAGNOSIS — Z794 Long term (current) use of insulin: Secondary | ICD-10-CM

## 2019-06-13 DIAGNOSIS — E1165 Type 2 diabetes mellitus with hyperglycemia: Secondary | ICD-10-CM

## 2019-06-13 DIAGNOSIS — K760 Fatty (change of) liver, not elsewhere classified: Secondary | ICD-10-CM

## 2019-06-13 DIAGNOSIS — Z609 Problem related to social environment, unspecified: Secondary | ICD-10-CM

## 2019-06-13 LAB — BASIC METABOLIC PANEL
Anion gap: 8 (ref 5–15)
BUN: 16 mg/dL (ref 6–20)
CO2: 26 mmol/L (ref 22–32)
Calcium: 8.4 mg/dL — ABNORMAL LOW (ref 8.9–10.3)
Chloride: 104 mmol/L (ref 98–111)
Creatinine, Ser: 1.27 mg/dL — ABNORMAL HIGH (ref 0.44–1.00)
GFR calc Af Amer: 55 mL/min — ABNORMAL LOW (ref >60)
GFR calc non Af Amer: 47 mL/min — ABNORMAL LOW (ref >60)
Glucose, Bld: 214 mg/dL — ABNORMAL HIGH (ref 70–99)
Potassium: 3.5 mmol/L (ref 3.5–5.1)
Sodium: 138 mmol/L (ref 135–145)

## 2019-06-13 LAB — CBC
HCT: 27.8 % — ABNORMAL LOW (ref 36.0–46.0)
Hemoglobin: 8.2 g/dL — ABNORMAL LOW (ref 12.0–15.0)
MCH: 27.2 pg (ref 26.0–34.0)
MCHC: 29.5 g/dL — ABNORMAL LOW (ref 30.0–36.0)
MCV: 92.1 fL (ref 80.0–100.0)
Platelets: 349 10*3/uL (ref 150–400)
RBC: 3.02 MIL/uL — ABNORMAL LOW (ref 3.87–5.11)
RDW: 14.7 % (ref 11.5–15.5)
WBC: 9.1 10*3/uL (ref 4.0–10.5)
nRBC: 0 % (ref 0.0–0.2)

## 2019-06-13 LAB — GLUCOSE, CAPILLARY
Glucose-Capillary: 178 mg/dL — ABNORMAL HIGH (ref 70–99)
Glucose-Capillary: 213 mg/dL — ABNORMAL HIGH (ref 70–99)
Glucose-Capillary: 237 mg/dL — ABNORMAL HIGH (ref 70–99)
Glucose-Capillary: 174 mg/dL — ABNORMAL HIGH (ref 70–99)

## 2019-06-13 LAB — MAGNESIUM: Magnesium: 1.9 mg/dL (ref 1.7–2.4)

## 2019-06-13 MED ORDER — INSULIN GLARGINE 100 UNIT/ML ~~LOC~~ SOLN
36.0000 [IU] | Freq: Every day | SUBCUTANEOUS | Status: DC
  Administered 2019-06-13: 36 [IU] via SUBCUTANEOUS
  Filled 2019-06-13 (×2): qty 0.36

## 2019-06-13 MED ORDER — METOCLOPRAMIDE HCL 5 MG PO TABS
5.0000 mg | ORAL_TABLET | Freq: Three times a day (TID) | ORAL | Status: DC
  Administered 2019-06-13 – 2019-06-14 (×3): 5 mg via ORAL
  Filled 2019-06-13 (×3): qty 1

## 2019-06-13 MED ORDER — INSULIN ASPART 100 UNIT/ML ~~LOC~~ SOLN
8.0000 [IU] | Freq: Three times a day (TID) | SUBCUTANEOUS | Status: DC
  Administered 2019-06-13 – 2019-06-14 (×3): 8 [IU] via SUBCUTANEOUS

## 2019-06-13 NOTE — Progress Notes (Addendum)
PROGRESS NOTE  Deborah [name] WUJ:[id number] DOB: 1965-12-29 DOA: 06/09/2019 PCP: [name], DO   LOS: 3 days   Brief narrative: As per HPI,  This is a 54 year old female with history of morbid obesity, poorly controlled type 2 diabetes, hypertension, HFrEF EF 40 to 45% in 2020 was admitted to hospital this time for diabetic ketoacidosis, acute kidney injury, volume depletion. Nephrology consulted for AKI and patient was believed to be severely volume depleted with improved creatinine with LR.  Transferred to med telemetry on 4/5 after anion gap and acidosis corrected. Creatinine continued to improve. Restarted on torsemide by nephro and is to follow up outpatient.   Assessment/Plan:  Principal Problem:   DKA (diabetic ketoacidoses) (HCC) Active Problems:   Essential hypertension, benign   Uncontrolled type 2 diabetes mellitus with diabetic neuropathy, with long-term current use of insulin (HCC)   CKD (chronic kidney disease), stage II   Nausea and vomiting   Morbid obesity (HCC)   High risk social situation   Nonalcoholic fatty liver disease  Diabetic ketoacidosis with poorly controlled type 2 diabetes.  Hemoglobin A1c of 8.3.  At this time DKA has resolved. Will increase  Lantus 36 units at bedtime, NovoLog 8 units 3 times daily with meals, sensitive sliding scale 3 times daily with meals and night sliding scale.Diabetic coordinator on board. Will need close follow-up outpatient  AKI on CKD 3b Secondary to severe volume depletion.  Improved after IV fluid hydration.  Could restart torsemide as needed for edema as per nephrology.  Currently on renal dose of Cymbalta.  Patient will need follow-up with nephrology as outpatient.  Hypokalemia/hypomagnesemia Improved.  Will replenish as necessary.  Compensated HFrEF 2D Echo 01/26/2019: EF 40 to 45%, decreased from 2019 with global hypokinesis and small pericardial effusion.    Will consider torsemide for now.  Check BMP in  a.m.  Daily weights strict intake and output charting.  Noncardiac chest pain, likely musculoskeletal from vomiting with component of GERD Minimally elevated troponin in setting of CKD and elevated at baseline.  Continue Protonix  Acute on chronic abdominal pain likely secondary to DKA and musculoskeletal from vomiting with possible component of gastroparesis Patient still complains of abdominal pain.  States that Bentyl does not help.  CT scan of the abdomen and pelvis was unremarkable.  On Dilaudid for moderate pain counseled about it.  Continue GI cocktail.  Patient has been treated for gastroparesis at the resident clinic.  Continue Reglan.  Essential hypertension on home metoprolol, hydralazine and amlodipine.  Blood pressure seems to be stable.  Hyperlipidemia continue statin  Insomnia on melatonin   Anemia of CKD stable.  Hemoglobin of 8.2.  Atrial fibrillation on Eliquis.  No evidence of bleeding.  Controlled at this time.  Anxiety/depression  continue Cymbalta  GERD on Protonix   VTE Prophylaxis: Eliquis  Code Status: Full code  Family Communication: None  Disposition Plan:  . Patient is from home . Likely disposition to home by tomorrow if her renal function remained stable, improved abdominal pain. . Barriers to discharge: Nausea and abdominal pain, adjusting insulin doses   Consultants:  Nephrology  Procedures:  None  Antibiotics:  . None  Anti-infectives (From admission, onward)   None     Subjective: Today, patient was seen and examined at bedside.  Still complains of diffuse abdominal pain and states that she vomited yesterday.  Still does not feel like she is at her baseline.  Objective: Vitals:   06/13/19 0543 06/13/19 0711  BP:  129/64   Pulse: 84   Resp: 18 (!) 22  Temp: 98.2 F (36.8 C)   SpO2: 94%     Intake/Output Summary (Last 24 hours) at 06/13/2019 1407 Last data filed at 06/12/2019 1639 Gross per 24 hour  Intake 240 ml    Output 200 ml  Net 40 ml   Filed Weights   06/10/19 0856 06/11/19 1227 06/13/19 0541  Weight: (!) 139.4 kg (!) 148.1 kg (!) 152.5 kg   Body mass index is 54.26 kg/m.   Physical Exam: GENERAL: Patient is alert awake and oriented. Not in obvious distress. HENT: No scleral pallor or icterus. Pupils equally reactive to light. Oral mucosa is moist NECK: is supple, no gross swelling noted. CHEST: Clear to auscultation. No crackles or wheezes.  Diminished breath sounds bilaterally. CVS: S1 and S2 heard, no murmur. Regular rate and rhythm.  ABDOMEN: Soft, non-tender, bowel sounds are present. EXTREMITIES: No edema. CNS: Cranial nerves are intact. No focal motor deficits. SKIN: warm and dry without rashes.  Data Review: I have personally reviewed the following laboratory data and studies,  CBC: Recent Labs  Lab 06/09/19 2350 06/11/19 0740 06/12/19 0420 06/13/19 0415  WBC 10.2 10.3 9.7 9.1  NEUTROABS 8.5*  --   --   --   HGB 10.6* 9.6* 8.3* 8.2*  HCT 34.6* 32.7* 28.3* 27.8*  MCV 86.9 90.8 91.9 92.1  PLT 220 397 343 941   Basic Metabolic Panel: Recent Labs  Lab 06/10/19 1412 06/10/19 1842 06/11/19 0740 06/12/19 0420 06/13/19 0415  NA 139 139 137 137 138  K 3.0* 3.1* 3.3* 3.4* 3.5  CL 99 99 99 102 104  CO2 30 27 27 24 26   GLUCOSE 157* 202* 194* 210* 214*  BUN 15 14 14 15 16   CREATININE 2.70* 2.47* 2.28* 1.85* 1.27*  CALCIUM 8.2* 8.0* 8.0* 7.8* 8.4*  MG  --  1.1* 1.1* 1.8 1.9   Liver Function Tests: Recent Labs  Lab 06/09/19 2350  AST 25  ALT 16  ALKPHOS 101  BILITOT 1.6*  PROT 7.8  ALBUMIN 3.7   Recent Labs  Lab 06/09/19 2350  LIPASE 23   No results for input(s): AMMONIA in the last 168 hours. Cardiac Enzymes: No results for input(s): CKTOTAL, CKMB, CKMBINDEX, TROPONINI in the last 168 hours. BNP (last 3 results) Recent Labs    03/12/19 0756 05/07/19 1451  BNP 76.8 127.1*    ProBNP (last 3 results) No results for input(s): PROBNP in the last  8760 hours.  CBG: Recent Labs  Lab 06/12/19 1150 06/12/19 1637 06/12/19 2038 06/13/19 0736 06/13/19 1156  GLUCAP 280* 242* 211* 178* 213*   Recent Results (from the past 240 hour(s))  SARS CORONAVIRUS 2 (TAT 6-24 HRS) Nasopharyngeal Nasopharyngeal Swab     Status: None   Collection Time: 06/10/19  1:47 AM   Specimen: Nasopharyngeal Swab  Result Value Ref Range Status   SARS Coronavirus 2 NEGATIVE NEGATIVE Final    Comment: (NOTE) SARS-CoV-2 target nucleic acids are NOT DETECTED. The SARS-CoV-2 RNA is generally detectable in upper and lower respiratory specimens during the acute phase of infection. Negative results do not preclude SARS-CoV-2 infection, do not rule out co-infections with other pathogens, and should not be used as the sole basis for treatment or other patient management decisions. Negative results must be combined with clinical observations, patient history, and epidemiological information. The expected result is Negative. Fact Sheet for Patients: SugarRoll.be Fact Sheet for Healthcare Providers: https://www.woods-mathews.com/ This test is not yet  approved or cleared by the [place] and  has been authorized for detection and/or diagnosis of SARS-CoV-2 by FDA under an Emergency Use Authorization (EUA). This EUA will remain  in effect (meaning this test can be used) for the duration of the COVID-19 declaration under Section 56 4(b)(1) of the Act, 21 U.S.C. section 360bbb-3(b)(1), unless the authorization is terminated or revoked sooner. Performed at [facility], [place] [address] [place], [place] [address]   MRSA PCR Screening     Status: None   Collection Time: 06/10/19  5:46 AM   Specimen: Nasal Mucosa; Nasopharyngeal  Result Value Ref Range Status   MRSA by PCR NEGATIVE NEGATIVE Final    Comment:        The GeneXpert MRSA Assay (FDA approved for NASAL specimens only), is one component of  a comprehensive MRSA colonization surveillance program. It is not intended to diagnose MRSA infection nor to guide or monitor treatment for MRSA infections. Performed at [facility], [place] [address] [place], [place] [address]      Studies: No results found.    Flora Lipps, MD  [facility] 06/13/2019

## 2019-06-13 NOTE — Progress Notes (Signed)
Patient finally agreed to take meds  after multiple attempt. Refuses bp check.

## 2019-06-13 NOTE — Progress Notes (Signed)
Inpatient Diabetes Program Recommendations  AACE/ADA: New Consensus Statement on Inpatient Glycemic Control (2015)  Target Ranges:  Prepandial:   less than 140 mg/dL      Peak postprandial:   less than 180 mg/dL (1-2 hours)      Critically ill patients:  140 - 180 mg/dL   Lab Results  Component Value Date   GLUCAP 213 (H) 06/13/2019   HGBA1C 8.3 (H) 05/07/2019    Review of Glycemic Control  Spoke with pt this afternoon regarding her diabetes control. Pt states she checks her blood sugars infrequently and they usually run in the 200s. Discussed HgbA1C of 8.3% and improvement from 9.6% in 12/20. Pt states her home meds are the following:  Lantus 50 units QHS Novolog 30 units tidwc.  Denies any hypoglycemia. States she is not interested in OP Diabetes Education classes at this time. Encouraged her to check blood sugars more frequently and f/u with her MD every 3 months.  Continue to follow inpatient glycemic trends.  Thank you. Lorenda Peck, RD, LDN, CDE Inpatient Diabetes Coordinator [phone]

## 2019-06-13 NOTE — Plan of Care (Signed)
Patient sitting up in chair, BSC near chair so patient can use. Patient ambulates independently with no assistance needed. Will continue to monitor.  Problem: Health Behavior/Discharge Planning: Goal: Ability to manage health-related needs will improve Outcome: Progressing   Problem: Clinical Measurements: Goal: Ability to maintain clinical measurements within normal limits will improve Outcome: Progressing Goal: Will remain free from infection Outcome: Progressing Goal: Diagnostic test results will improve Outcome: Progressing Goal: Respiratory complications will improve Outcome: Progressing Goal: Cardiovascular complication will be avoided Outcome: Progressing   Problem: Activity: Goal: Risk for activity intolerance will decrease Outcome: Progressing   Problem: Nutrition: Goal: Adequate nutrition will be maintained Outcome: Progressing   Problem: Coping: Goal: Level of anxiety will decrease Outcome: Progressing   Problem: Elimination: Goal: Will not experience complications related to bowel motility Outcome: Progressing Goal: Will not experience complications related to urinary retention Outcome: Progressing   Problem: Pain Managment: Goal: General experience of comfort will improve Outcome: Progressing   Problem: Safety: Goal: Ability to remain free from injury will improve Outcome: Progressing   Problem: Skin Integrity: Goal: Risk for impaired skin integrity will decrease Outcome: Progressing   Problem: Education: Goal: Ability to describe self-care measures that may prevent or decrease complications (Diabetes Survival Skills Education) will improve Outcome: Progressing Goal: Individualized Educational Video(s) Outcome: Progressing   Problem: Coping: Goal: Ability to adjust to condition or change in health will improve Outcome: Progressing   Problem: Fluid Volume: Goal: Ability to maintain a balanced intake and output will improve Outcome: Progressing    Problem: Health Behavior/Discharge Planning: Goal: Ability to identify and utilize available resources and services will improve Outcome: Progressing Goal: Ability to manage health-related needs will improve Outcome: Progressing   Problem: Metabolic: Goal: Ability to maintain appropriate glucose levels will improve Outcome: Progressing   Problem: Nutritional: Goal: Maintenance of adequate nutrition will improve Outcome: Progressing Goal: Progress toward achieving an optimal weight will improve Outcome: Progressing   Problem: Skin Integrity: Goal: Risk for impaired skin integrity will decrease Outcome: Progressing   Problem: Tissue Perfusion: Goal: Adequacy of tissue perfusion will improve Outcome: Progressing

## 2019-06-13 NOTE — Care Management Important Message (Signed)
Important Message  Patient Details IM Letter given to Gabriel Earing RN Case Manager to present to the Patient Name: Deborah [name] MRN: [id number] Date of Birth: 02-Dec-1965   Medicare Important Message Given:  Yes     Kerin Salen 06/13/2019, 11:01 AM

## 2019-06-14 LAB — BASIC METABOLIC PANEL
Anion gap: 7 (ref 5–15)
BUN: 18 mg/dL (ref 6–20)
CO2: 30 mmol/L (ref 22–32)
Calcium: 9 mg/dL (ref 8.9–10.3)
Chloride: 100 mmol/L (ref 98–111)
Creatinine, Ser: 1.32 mg/dL — ABNORMAL HIGH (ref 0.44–1.00)
GFR calc Af Amer: 53 mL/min — ABNORMAL LOW (ref >60)
GFR calc non Af Amer: 45 mL/min — ABNORMAL LOW (ref >60)
Glucose, Bld: 185 mg/dL — ABNORMAL HIGH (ref 70–99)
Potassium: 3.7 mmol/L (ref 3.5–5.1)
Sodium: 137 mmol/L (ref 135–145)

## 2019-06-14 LAB — GLUCOSE, CAPILLARY
Glucose-Capillary: 178 mg/dL — ABNORMAL HIGH (ref 70–99)
Glucose-Capillary: 223 mg/dL — ABNORMAL HIGH (ref 70–99)

## 2019-06-14 LAB — MAGNESIUM: Magnesium: 1.3 mg/dL — ABNORMAL LOW (ref 1.7–2.4)

## 2019-06-14 LAB — CBC
HCT: 29.6 % — ABNORMAL LOW (ref 36.0–46.0)
Hemoglobin: 8.8 g/dL — ABNORMAL LOW (ref 12.0–15.0)
MCH: 26.8 pg (ref 26.0–34.0)
MCHC: 29.7 g/dL — ABNORMAL LOW (ref 30.0–36.0)
MCV: 90.2 fL (ref 80.0–100.0)
Platelets: 344 10*3/uL (ref 150–400)
RBC: 3.28 MIL/uL — ABNORMAL LOW (ref 3.87–5.11)
RDW: 14.6 % (ref 11.5–15.5)
WBC: 8.7 10*3/uL (ref 4.0–10.5)
nRBC: 0 % (ref 0.0–0.2)

## 2019-06-14 MED ORDER — LANTUS SOLOSTAR 100 UNIT/ML ~~LOC~~ SOPN: 45.0000 [IU] | PEN_INJECTOR | Freq: Every day | SUBCUTANEOUS | Status: DC

## 2019-06-14 MED ORDER — MAGNESIUM OXIDE 400 (241.3 MG) MG PO TABS
400.0000 mg | ORAL_TABLET | Freq: Two times a day (BID) | ORAL | Status: DC
  Administered 2019-06-14: 400 mg via ORAL
  Filled 2019-06-14: qty 1

## 2019-06-14 MED ORDER — MAGNESIUM OXIDE 400 (241.3 MG) MG PO TABS: 400.0000 mg | ORAL_TABLET | Freq: Two times a day (BID) | ORAL | 0 refills | Status: DC

## 2019-06-14 NOTE — Progress Notes (Signed)
Helped patient dress, reviewed d/c information and patient is waiting on her ride which should be here in the next 15 mins and will transport patient to front entrance via wheelchair when ride arrives.

## 2019-06-14 NOTE — Discharge Summary (Signed)
.   Physician Discharge Summary  Deborah SCHMELZER TGY:[id number] DOB: Sep 20, 1965 DOA: 06/09/2019  PCP: [name], DO  Admit date: 06/09/2019 Discharge date: 06/14/2019  Admitted From: Home  Discharge disposition: home   Recommendations for Outpatient Follow-Up:   . Follow up with your primary care provider in one week.  . Check CBC, BMP in the next visit   Discharge Diagnosis:   Principal Problem:   DKA (diabetic ketoacidoses) (Deborah [name]) Active Problems:   Essential hypertension, benign   Uncontrolled type 2 diabetes mellitus with diabetic neuropathy, with long-term current use of insulin (HCC)   CKD (chronic kidney disease), stage II   Nausea and vomiting   Morbid obesity (HCC)   High risk social situation   Nonalcoholic fatty liver disease   Discharge Condition: Improved.  Diet recommendation: Low sodium, heart healthy.  Carbohydrate-modified.    Wound care: None.  Code status: Full.   History of Present Illness:   This is a 54 year old female with history of morbid obesity, poorly controlled type 2 diabetes, hypertension, HFrEF EF 40 to 45% in 2020 was admitted to hospital this time for diabetic ketoacidosis, acute kidney injury, volume depletion.Nephrology consulted for AKI and patient was believed to be severely volume depleted with improved creatinine with LR. Transferred to med telemetry on 4/5 after anion gap and acidosis corrected. Creatinine continued to improve. Restarted on torsemide by nephro and is to follow up outpatient.   Hospital Course:   Following conditions were addressed during hospitalization as listed below,  Diabetic ketoacidosis with poorly controlled type 2 diabetes.  Hemoglobin A1c of 8.3.  At this time DKA has resolve.Diabetic coordinator saw the patient during hospitalization.  Patient is not interested in outpatient diabetic education classes.  Will need close follow-up outpatient for further details in the future.  AKI on CKD  3b Secondary to severe volume depletion.  Improved after IV fluid hydration.    Torsemide has been restarted.  Creatinine prior to discharge was 1.3.  Hypokalemia/hypomagnesemia Improved.    Compensated HFrEF 2D Echo 01/26/2019: EF 40 to 45%, decreased from 2019 with global hypokinesis and small pericardial effusion.    Will restart torsemide on discharge.  Noncardiac chest pain, likely musculoskeletal from vomiting with component of GERD Minimally elevated troponin in setting of CKD and elevated at baseline.  Continue Protonix  Acute on chronic abdominal pain likely secondary to DKA and musculoskeletal from vomiting with possible component of gastroparesis It appears that patient does have narcotic seeking tendencies.  Explained that gastroparesis will get worse with narcotics and discouraged use of narcotics.  Patient is supposed to follow-up with GI as outpatient.  Continue Reglan. CT scan of the abdomen and pelvis was unremarkable.  Patient has been treated for gastroparesis at the resident clinic.   Essential hypertension on home metoprolol, hydralazine and amlodipine.  Blood pressure seems to be stable.  Hyperlipidemia continue statin  Insomnia on melatonin   Anemia of CKDstable.  Hemoglobin of 8.8.  Atrial fibrillationon Eliquis.  No evidence of bleeding.    Rate controlled  at this time.  Anxiety/depression continue Cymbalta  GERD on Protonix  Disposition.  At this time, patient is stable for disposition home.  Medical Consultants:    Nephrology  Procedures:    None Subjective:   Today, patient complains of mild nausea, has mild abdominal pain which is chronic.  Discharge Exam:   Vitals:   06/13/19 2150 06/14/19 0631  BP: 129/75 (!) 142/87  Pulse: 81 87  Resp: 17 20  Temp: 98.4 F (36.9 C) (!) 97.5 F (36.4 C)  SpO2: 98% 97%   Vitals:   06/13/19 1304 06/13/19 1505 06/13/19 2150 06/14/19 0631  BP: 126/74  129/75 (!) 142/87  Pulse: 92  81  87  Resp: _0 Temp: 98.6 F (37 C)  98.4 F (36.9 C) (!) 97.5 F (36.4 C)  TempSrc: Oral   Oral  SpO2: 100%  98% 97%  Weight:    (!) 152.7 kg  Height:        General: Alert awake, not in obvious distress, obese HENT: pupils equally reacting to light,  No scleral pallor or icterus noted. Oral mucosa is moist.  Chest:  Clear breath sounds.  Diminished breath sounds bilaterally. No crackles or wheezes.  CVS: S1 &S2 heard. No murmur.  Regular rate and rhythm. Abdomen: Soft, nontender, nondistended.  Bowel sounds are heard.   Extremities: No cyanosis, clubbing or edema.  Peripheral pulses are palpable. Psych: Alert, awake and oriented, normal mood CNS:  No cranial nerve deficits.  Power equal in all extremities.   Skin: Warm and dry.  No rashes noted.  The results of significant diagnostics from this hospitalization (including imaging, microbiology, ancillary and laboratory) are listed below for reference.     Diagnostic Studies:   CT ABDOMEN PELVIS WO CONTRAST  Result Date: 06/10/2019 CLINICAL DATA:  Nausea and vomiting. EXAM: CT ABDOMEN AND PELVIS WITHOUT CONTRAST TECHNIQUE: Multidetector CT imaging of the abdomen and pelvis was performed following the standard protocol without IV contrast. COMPARISON:  May 27, 2019 FINDINGS: Lower chest: The lung bases are clear. The heart size is normal. Hepatobiliary: There is decreased hepatic attenuation suggestive of hepatic steatosis. Status post cholecystectomy.There is no biliary ductal dilation. Pancreas: Normal contours without ductal dilatation. No peripancreatic fluid collection. Spleen: Unremarkable. Adrenals/Urinary Tract: --Adrenal glands: Unremarkable. --Right kidney/ureter: No hydronephrosis or radiopaque kidney stones. --Left kidney/ureter: No hydronephrosis or radiopaque kidney stones. --Urinary bladder: Unremarkable. Stomach/Bowel: --Stomach/Duodenum: No hiatal hernia or other gastric abnormality. Normal duodenal course and  caliber. --Small bowel: Unremarkable. --Colon: Unremarkable. --Appendix: Normal. Vascular/Lymphatic: Atherosclerotic calcification is present within the non-aneurysmal abdominal aorta, without hemodynamically significant stenosis. --No retroperitoneal lymphadenopathy. --No mesenteric lymphadenopathy. --No pelvic or inguinal lymphadenopathy. Reproductive: A dominant uterine fibroid is again noted measuring approximately 5.1 cm. Other: No ascites or free air. The abdominal wall is normal. Musculoskeletal. No acute displaced fractures. IMPRESSION: 1. No CT findings to explain the patient's nausea and vomiting. 2. Hepatic steatosis. 3. Aortic Atherosclerosis (ICD10-I70.0). Electronically Signed   By: Constance Holster M.D.   On: 06/10/2019 02:50   US RENAL  Result Date: 06/10/2019 CLINICAL DATA:  AKI EXAM: RENAL / URINARY TRACT ULTRASOUND COMPLETE COMPARISON:  None. FINDINGS: Right Kidney: Renal measurements: 12 x 4.8 x 5.3 cm = volume: 160.6 mL . Echogenicity within normal limits. No mass or hydronephrosis visualized. Left Kidney: Renal measurements: 12 x 5.9 x 4.9 cm = volume: 22 mL. Echogenicity within normal limits. No mass or hydronephrosis visualized. Bladder: Appears normal for degree of bladder distention. Other: Increased liver echogenicity likely reflecting steatosis. IMPRESSION: No hydronephrosis. Electronically Signed   By: Macy Mis M.D.   On: 06/10/2019 16:28   DG Chest Port 1 View  Result Date: 06/10/2019 CLINICAL DATA:  [name] EXAM: PORTABLE CHEST 1 VIEW COMPARISON:  06/05/2019 FINDINGS: Cardiac shadow is enlarged but stable. Lungs are well aerated bilaterally. No focal infiltrate or sizable effusion is seen. Previously seen vascular congestion has improved. No bony abnormality is  noted. IMPRESSION: No acute abnormality noted. Electronically Signed   By: Inez Catalina M.D.   On: 06/10/2019 00:51     Labs:   Basic Metabolic Panel: Recent Labs  Lab 06/10/19 1842  06/10/19 1842 06/11/19 0740 06/11/19 0740 06/12/19 0420 06/12/19 0420 06/13/19 0415 06/14/19 0433  NA 139  --  137  --  137  --  138 137  K 3.1*   < > 3.3*   < > 3.4*   < > 3.5 3.7  CL 99  --  99  --  102  --  104 100  CO2 27  --  27  --  24  --  26 30  GLUCOSE 202*  --  194*  --  210*  --  214* 185*  BUN 14  --  14  --  15  --  16 18  CREATININE 2.47*  --  2.28*  --  1.85*  --  1.27* 1.32*  CALCIUM 8.0*  --  8.0*  --  7.8*  --  8.4* 9.0  MG 1.1*  --  1.1*  --  1.8  --  1.9 1.3*   < > = values in this interval not displayed.   GFR Estimated Creatinine Clearance: 73.5 mL/min (A) (by C-G formula based on SCr of 1.32 mg/dL (H)). Liver Function Tests: Recent Labs  Lab 06/09/19 2350  AST 25  ALT 16  ALKPHOS 101  BILITOT 1.6*  PROT 7.8  ALBUMIN 3.7   Recent Labs  Lab 06/09/19 2350  LIPASE 23   No results for input(s): AMMONIA in the last 168 hours. Coagulation profile No results for input(s): INR, PROTIME in the last 168 hours.  CBC: Recent Labs  Lab 06/09/19 2350 06/11/19 0740 06/12/19 0420 06/13/19 0415 06/14/19 0433  WBC 10.2 10.3 9.7 9.1 8.7  NEUTROABS 8.5*  --   --   --   --   HGB 10.6* 9.6* 8.3* 8.2* 8.8*  HCT 34.6* 32.7* 28.3* 27.8* 29.6*  MCV 86.9 90.8 91.9 92.1 90.2  PLT 220 397 343 349 344   Cardiac Enzymes: No results for input(s): CKTOTAL, CKMB, CKMBINDEX, TROPONINI in the last 168 hours. BNP: Invalid input(s): POCBNP CBG: Recent Labs  Lab 06/12/19 2038 06/13/19 0736 06/13/19 1156 06/13/19 1711 06/13/19 2148  GLUCAP 211* 178* 213* 237* 174*   D-Dimer No results for input(s): DDIMER in the last 72 hours. Hgb A1c No results for input(s): HGBA1C in the last 72 hours. Lipid Profile No results for input(s): CHOL, HDL, LDLCALC, TRIG, CHOLHDL, LDLDIRECT in the last 72 hours. Thyroid function studies No results for input(s): TSH, T4TOTAL, T3FREE, THYROIDAB in the last 72 hours.  Invalid input(s): FREET3 Anemia work up No results for  input(s): VITAMINB12, FOLATE, FERRITIN, TIBC, IRON, RETICCTPCT in the last 72 hours. Microbiology Recent Results (from the past 240 hour(s))  SARS CORONAVIRUS 2 (TAT 6-24 HRS) Nasopharyngeal Nasopharyngeal Swab     Status: None   Collection Time: 06/10/19  1:47 AM   Specimen: Nasopharyngeal Swab  Result Value Ref Range Status   SARS Coronavirus 2 NEGATIVE NEGATIVE Final    Comment: (NOTE) SARS-CoV-2 target nucleic acids are NOT DETECTED. The SARS-CoV-2 RNA is generally detectable in upper and lower respiratory specimens during the acute phase of infection. Negative results do not preclude SARS-CoV-2 infection, do not rule out co-infections with other pathogens, and should not be used as the sole basis for treatment or other patient management decisions. Negative results must be combined with clinical  observations, patient history, and epidemiological information. The expected result is Negative. Fact Sheet for Patients: SugarRoll.be Fact Sheet for Healthcare Providers: https://www.woods-mathews.com/ This test is not yet approved or cleared by the [place] FDA and  has been authorized for detection and/or diagnosis of SARS-CoV-2 by FDA under an Emergency Use Authorization (EUA). This EUA will remain  in effect (meaning this test can be used) for the duration of the COVID-19 declaration under Section 56 4(b)(1) of the Act, 21 U.S.C. section 360bbb-3(b)(1), unless the authorization is terminated or revoked sooner. Performed at [facility], [place] [address], [place], [place] [address]   MRSA PCR Screening     Status: None   Collection Time: 06/10/19  5:46 AM   Specimen: Nasal Mucosa; Nasopharyngeal  Result Value Ref Range Status   MRSA by PCR NEGATIVE NEGATIVE Final    Comment:        The GeneXpert MRSA Assay (FDA approved for NASAL specimens only), is one component of a comprehensive MRSA colonization surveillance program. It is  not intended to diagnose MRSA infection nor to guide or monitor treatment for MRSA infections. Performed at [facility], [place] [address], [place], [place] [address]      Discharge Instructions:   Discharge Instructions    Diet - low sodium heart healthy   Complete by: As directed    Diet Carb Modified   Complete by: As directed    Discharge instructions   Complete by: As directed    Please take medications as prescribed.  Follow-up with your primary care physician in 1 week.   Increase activity slowly   Complete by: As directed      Allergies as of 06/14/2019      Reactions   Contrast Media [iodinated Diagnostic Agents] Anaphylaxis   Cardiac arrest   Diazepam [name]   Isovue [iopamidol] Anaphylaxis   Patient had seizure like activity and then code post 100 cc of isovue 300   Lisinopril Anaphylaxis   Tongue and mouth swelling   Penicillins Palpitations   Has patient had a PCN reaction causing immediate rash, facial/tongue/throat swelling, SOB or lightheadedness with hypotension: Yes, heart races Has patient had a PCN reaction causing severe rash involving mucus membranes or skin necrosis: No Has patient had a PCN reaction that required hospitalization: Yes  Has patient had a PCN reaction occurring within the last 10 years: No   Acetaminophen Nausea Only, Other (See Comments)   Irritates stomach ulcer  Abdominal pain   Tolmetin Nausea Only, Other (See Comments)   ULCER   Aspirin Other (See Comments)   Irritates stomach ulcer    Food Hives, Swelling   Reaction to Carrots, ketchup    Nsaids Other (See Comments)   ULCER   Tramadol Nausea And Vomiting      Medication List    TAKE these medications   albuterol (2.5 MG/3ML) 0.083% nebulizer solution Commonly known as: PROVENTIL Take 3 mLs (2.5 mg total) by nebulization every 6 (six) hours as needed for wheezing or shortness of breath.   amLODipine 10 MG tablet Commonly known as:  NORVASC Take 10 mg by mouth every morning.   apixaban 5 MG Tabs tablet Commonly known as: ELIQUIS Take 1 tablet (5 mg total) by mouth 2 (two) times daily.   aspirin EC 81 MG tablet Take 81 mg by mouth daily.   atorvastatin 10 MG tablet Commonly known as: LIPITOR Take 1 tablet (10 mg total) by mouth daily.  blood glucose meter kit and supplies Kit Dispense based on patient and insurance preference. Use up to four times daily as directed. (FOR ICD-9 250.00, 250.01).   cetirizine 10 MG tablet Commonly known as: ZYRTEC Take 1 tablet (10 mg total) by mouth daily.   dicyclomine 20 MG tablet Commonly known as: BENTYL Take 1 tablet (20 mg total) by mouth 3 (three) times daily as needed for spasms (abdominal cramping).   DULoxetine HCl 40 MG Cpep TAKE ONE CAPSULE BY MOUTH TWICE DAILY   fluticasone 50 MCG/ACT nasal spray Commonly known as: FLONASE Place 2 sprays into both nostrils daily as needed for allergies or rhinitis.   hydrALAZINE 25 MG tablet Commonly known as: APRESOLINE TAKE ONE TABLET BY MOUTH THREE TIMES A DAY. FOR BLOOD PRESSURE What changed: See the new instructions.   Lantus SoloStar 100 UNIT/ML Solostar Pen Generic drug: insulin glargine Inject 45 Units into the skin at bedtime.   loperamide 2 MG capsule Commonly known as: IMODIUM Take 2 mg by mouth 4 (four) times daily as needed for diarrhea or loose stools.   magnesium oxide 400 (241.3 Mg) MG tablet Commonly known as: MAG-OX Take 1 tablet (400 mg total) by mouth 2 (two) times daily.   melatonin 3 MG Tabs tablet Take 2 tablets (6 mg total) by mouth at bedtime.   metoCLOPramide 10 MG tablet Commonly known as: REGLAN Take 1 tablet (10 mg total) by mouth 3 (three) times daily before meals.   metoprolol succinate 25 MG 24 hr tablet Commonly known as: TOPROL-XL Take 25 mg by mouth daily.   nitroGLYCERIN 0.4 MG SL tablet Commonly known as: NITROSTAT Place 1 tablet (0.4 mg total) under the tongue every 5  (five) minutes as needed for chest pain.   NovoLOG FlexPen 100 UNIT/ML FlexPen Generic drug: insulin aspart Inject 30 Units into the skin 4 (four) times daily.   ondansetron 4 MG disintegrating tablet Commonly known as: ZOFRAN-ODT Take 4 mg by mouth every 8 (eight) hours as needed.   pantoprazole 40 MG tablet Commonly known as: PROTONIX Take 1 tablet (40 mg total) by mouth 2 (two) times daily.   torsemide 20 MG tablet Commonly known as: DEMADEX Take 2 tablets (40 mg total) by mouth daily.       Time coordinating discharge: 39 minutes  Signed:     [facility] 06/14/2019, 11:20 AM

## 2019-06-14 NOTE — Plan of Care (Signed)
Problem: Health Behavior/Discharge Planning: Goal: Ability to manage health-related needs will improve 06/14/2019 1357 by Jeanell Sparrow, RN Outcome: Adequate for Discharge 06/14/2019 1037 by Jeanell Sparrow, RN Outcome: Progressing   Problem: Clinical Measurements: Goal: Ability to maintain clinical measurements within normal limits will improve 06/14/2019 1357 by Jeanell Sparrow, RN Outcome: Adequate for Discharge 06/14/2019 1037 by Jeanell Sparrow, RN Outcome: Progressing Goal: Will remain free from infection 06/14/2019 1357 by Jeanell Sparrow, RN Outcome: Adequate for Discharge 06/14/2019 1037 by Jeanell Sparrow, RN Outcome: Progressing Goal: Diagnostic test results will improve 06/14/2019 1357 by Jeanell Sparrow, RN Outcome: Adequate for Discharge 06/14/2019 1037 by Jeanell Sparrow, RN Outcome: Progressing Goal: Respiratory complications will improve 06/14/2019 1357 by Jeanell Sparrow, RN Outcome: Adequate for Discharge 06/14/2019 1037 by Jeanell Sparrow, RN Outcome: Progressing Goal: Cardiovascular complication will be avoided 06/14/2019 1357 by Jeanell Sparrow, RN Outcome: Adequate for Discharge 06/14/2019 1037 by Jeanell Sparrow, RN Outcome: Progressing   Problem: Activity: Goal: Risk for activity intolerance will decrease 06/14/2019 1357 by Jeanell Sparrow, RN Outcome: Adequate for Discharge 06/14/2019 1037 by Jeanell Sparrow, RN Outcome: Progressing   Problem: Nutrition: Goal: Adequate nutrition will be maintained 06/14/2019 1357 by Jeanell Sparrow, RN Outcome: Adequate for Discharge 06/14/2019 1037 by Jeanell Sparrow, RN Outcome: Progressing   Problem: Coping: Goal: Level of anxiety will decrease 06/14/2019 1357 by Jeanell Sparrow, RN Outcome: Adequate for Discharge 06/14/2019 1037 by Jeanell Sparrow, RN Outcome: Progressing   Problem: Elimination: Goal: Will not experience complications related to bowel motility 06/14/2019 1357 by Jeanell Sparrow, RN Outcome: Adequate for  Discharge 06/14/2019 1037 by Jeanell Sparrow, RN Outcome: Progressing Goal: Will not experience complications related to urinary retention 06/14/2019 1357 by Jeanell Sparrow, RN Outcome: Adequate for Discharge 06/14/2019 1037 by Jeanell Sparrow, RN Outcome: Progressing   Problem: Pain Managment: Goal: General experience of comfort will improve 06/14/2019 1357 by Jeanell Sparrow, RN Outcome: Adequate for Discharge 06/14/2019 1037 by Jeanell Sparrow, RN Outcome: Progressing   Problem: Safety: Goal: Ability to remain free from injury will improve 06/14/2019 1357 by Jeanell Sparrow, RN Outcome: Adequate for Discharge 06/14/2019 1037 by Jeanell Sparrow, RN Outcome: Progressing   Problem: Skin Integrity: Goal: Risk for impaired skin integrity will decrease 06/14/2019 1357 by Jeanell Sparrow, RN Outcome: Adequate for Discharge 06/14/2019 1037 by Jeanell Sparrow, RN Outcome: Progressing   Problem: Education: Goal: Ability to describe self-care measures that may prevent or decrease complications (Diabetes Survival Skills Education) will improve 06/14/2019 1357 by Jeanell Sparrow, RN Outcome: Adequate for Discharge 06/14/2019 1037 by Jeanell Sparrow, RN Outcome: Progressing Goal: Individualized Educational Video(s) 06/14/2019 1357 by Jeanell Sparrow, RN Outcome: Adequate for Discharge 06/14/2019 1037 by Jeanell Sparrow, RN Outcome: Progressing   Problem: Coping: Goal: Ability to adjust to condition or change in health will improve 06/14/2019 1357 by Jeanell Sparrow, RN Outcome: Adequate for Discharge 06/14/2019 1037 by Jeanell Sparrow, RN Outcome: Progressing   Problem: Fluid Volume: Goal: Ability to maintain a balanced intake and output will improve 06/14/2019 1357 by Jeanell Sparrow, RN Outcome: Adequate for Discharge 06/14/2019 1037 by Jeanell Sparrow, RN Outcome: Progressing   Problem: Health Behavior/Discharge Planning: Goal: Ability to identify and utilize available resources and services will  improve 06/14/2019 1357 by Jeanell Sparrow, RN Outcome: Adequate for Discharge 06/14/2019 1037 by Jeanell Sparrow, RN Outcome: Progressing  Goal: Ability to manage health-related needs will improve 06/14/2019 1357 by Jeanell Sparrow, RN Outcome: Adequate for Discharge 06/14/2019 1037 by Jeanell Sparrow, RN Outcome: Progressing   Problem: Metabolic: Goal: Ability to maintain appropriate glucose levels will improve 06/14/2019 1357 by Jeanell Sparrow, RN Outcome: Adequate for Discharge 06/14/2019 1037 by Jeanell Sparrow, RN Outcome: Progressing   Problem: Nutritional: Goal: Maintenance of adequate nutrition will improve 06/14/2019 1357 by Jeanell Sparrow, RN Outcome: Adequate for Discharge 06/14/2019 1037 by Jeanell Sparrow, RN Outcome: Progressing Goal: Progress toward achieving an optimal weight will improve 06/14/2019 1357 by Jeanell Sparrow, RN Outcome: Adequate for Discharge 06/14/2019 1037 by Jeanell Sparrow, RN Outcome: Progressing   Problem: Skin Integrity: Goal: Risk for impaired skin integrity will decrease 06/14/2019 1357 by Jeanell Sparrow, RN Outcome: Adequate for Discharge 06/14/2019 1037 by Jeanell Sparrow, RN Outcome: Progressing   Problem: Tissue Perfusion: Goal: Adequacy of tissue perfusion will improve 06/14/2019 1357 by Jeanell Sparrow, RN Outcome: Adequate for Discharge 06/14/2019 1037 by Jeanell Sparrow, RN Outcome: Progressing

## 2019-06-14 NOTE — Plan of Care (Signed)
Patient sitting up to chair, ambulates from chair to [facility] with out difficulties and steady gait. Will continue to monitor and encouraged patient to call if they need help. Patient states her pain and nausea is some better but still pretty bad. PRN's given as ordered.  Problem: Health Behavior/Discharge Planning: Goal: Ability to manage health-related needs will improve Outcome: Progressing   Problem: Clinical Measurements: Goal: Ability to maintain clinical measurements within normal limits will improve Outcome: Progressing Goal: Will remain free from infection Outcome: Progressing Goal: Diagnostic test results will improve Outcome: Progressing Goal: Respiratory complications will improve Outcome: Progressing Goal: Cardiovascular complication will be avoided Outcome: Progressing   Problem: Activity: Goal: Risk for activity intolerance will decrease Outcome: Progressing   Problem: Nutrition: Goal: Adequate nutrition will be maintained Outcome: Progressing   Problem: Coping: Goal: Level of anxiety will decrease Outcome: Progressing   Problem: Elimination: Goal: Will not experience complications related to bowel motility Outcome: Progressing Goal: Will not experience complications related to urinary retention Outcome: Progressing   Problem: Pain Managment: Goal: General experience of comfort will improve Outcome: Progressing   Problem: Safety: Goal: Ability to remain free from injury will improve Outcome: Progressing   Problem: Skin Integrity: Goal: Risk for impaired skin integrity will decrease Outcome: Progressing   Problem: Education: Goal: Ability to describe self-care measures that may prevent or decrease complications (Diabetes Survival Skills Education) will improve Outcome: Progressing Goal: Individualized Educational Video(s) Outcome: Progressing   Problem: Coping: Goal: Ability to adjust to condition or change in health will improve Outcome: Progressing    Problem: Fluid Volume: Goal: Ability to maintain a balanced intake and output will improve Outcome: Progressing   Problem: Health Behavior/Discharge Planning: Goal: Ability to identify and utilize available resources and services will improve Outcome: Progressing Goal: Ability to manage health-related needs will improve Outcome: Progressing   Problem: Metabolic: Goal: Ability to maintain appropriate glucose levels will improve Outcome: Progressing   Problem: Nutritional: Goal: Maintenance of adequate nutrition will improve Outcome: Progressing Goal: Progress toward achieving an optimal weight will improve Outcome: Progressing   Problem: Skin Integrity: Goal: Risk for impaired skin integrity will decrease Outcome: Progressing   Problem: Tissue Perfusion: Goal: Adequacy of tissue perfusion will improve Outcome: Progressing

## 2019-06-18 ENCOUNTER — Telehealth: Payer: Self-pay

## 2019-06-18 NOTE — Chronic Care Management (AMB) (Signed)
  Chronic Care Management   Note  06/18/2019 Name: [name] MRN: [id number] DOB: 26-Aug-1965  Deborah [name] is a 54 y.o. year old female who is a primary care patient of [name], DO. I reached out to Deborah [name] by phone today in response to a referral sent by [name]'s PCP, [name] DO     [name] was given information about Chronic Care Management services today including:  1. CCM service includes personalized support from designated clinical staff supervised by her physician, including individualized plan of care and coordination with other care providers 2. 24/7 contact phone numbers for assistance for urgent and routine care needs. 3. Service will only be billed when office clinical staff spend 20 minutes or more in a month to coordinate care. 4. Only one practitioner may furnish and bill the service in a calendar month. 5. The patient may stop CCM services at any time (effective at the end of the month) by phone call to the office staff. 6. The patient will be responsible for cost sharing (co-pay) of up to 20% of the service fee (after annual deductible is met).  Patient agreed to services and verbal consent obtained.   Follow up plan: Telephone appointment with care management team member scheduled for:06/19/2019  Glenna Durand, [place] Management ??[email or web address] ??[phone]

## 2019-06-18 NOTE — Telephone Encounter (Signed)
Patient calls nurse line regarding message she received while she was in the hospital for lab work. Patient was admitted to [facility] from 4/3-4/8. Patient calling to see if she is still needing to come into office for lab work or when should she schedule follow up appointment.   To PCP. Please advise  Talbot Grumbling, RN

## 2019-06-18 NOTE — Telephone Encounter (Signed)
When would you like for patient to schedule follow up?  To PCP  Talbot Grumbling, RN

## 2019-06-19 ENCOUNTER — Encounter: Payer: Self-pay | Admitting: Nurse Practitioner

## 2019-06-19 ENCOUNTER — Ambulatory Visit: Payer: Medicare Other

## 2019-06-19 ENCOUNTER — Other Ambulatory Visit: Payer: Self-pay

## 2019-06-19 NOTE — Chronic Care Management (AMB) (Signed)
Care Management   Initial Visit Note  06/19/2019 Name: Deborah [name] MRN: [id number] DOB: 12-May-1965  Subjective:   Objective:  Assessment: Deborah [name] is a 54 y.o. year old female who sees [place], [place], DO for primary care. The care management team was consulted for assistance with care management and care coordination needs related to Disease Management Educational Needs DM II/ Cirrhosis .   Review of patient status, including review of consultants reports, relevant laboratory and other test results, and collaboration with appropriate care team members and the patient's provider was performed as part of comprehensive patient evaluation and provision of care management services.    SDOH (Social Determinants of Health) assessments performed: No See Care Plan activities for detailed interventions related to [facility])     Outpatient Encounter Medications as of 06/19/2019  Medication Sig Note  . albuterol (PROVENTIL) (2.5 MG/3ML) 0.083% nebulizer solution Take 3 mLs (2.5 mg total) by nebulization every 6 (six) hours as needed for wheezing or shortness of breath. 05/07/2019: 75 ml filled 04/23/2019 [facility]  . amLODipine (NORVASC) 10 MG tablet Take 10 mg by mouth every morning. 05/07/2019: #30 filled 04/19/2019 [facility]  . apixaban (ELIQUIS) 5 MG TABS tablet Take 1 tablet (5 mg total) by mouth 2 (two) times daily.   [name] aspirin EC 81 MG tablet Take 81 mg by mouth daily. 05/07/2019: #30 filled 04/19/2019 [facility]  . atorvastatin (LIPITOR) 10 MG tablet Take 1 tablet (10 mg total) by mouth daily. 05/07/2019: #30 filled 04/19/2019 [facility]  . cetirizine (ZYRTEC) 10 MG tablet Take 1 tablet (10 mg total) by mouth daily. 05/07/2019: #30 filled 04/19/2019 [facility]  . DULoxetine HCl 40 MG CPEP TAKE ONE CAPSULE BY MOUTH TWICE DAILY   . fluticasone (FLONASE) 50 MCG/ACT nasal spray Place 2 sprays into both nostrils daily as needed for allergies or rhinitis. 05/07/2019: LF  04/19/2019 [facility]  . hydrALAZINE (APRESOLINE) 25 MG tablet TAKE ONE TABLET BY MOUTH THREE TIMES A DAY. FOR BLOOD PRESSURE (Patient taking differently: Take 25 mg by mouth 3 (three) times daily. For blood pressure) 05/07/2019: #90 filled 04/19/2019 [facility]  . insulin aspart (NOVOLOG FLEXPEN) 100 UNIT/ML FlexPen Inject 30 Units into the skin 4 (four) times daily. 05/07/2019: 30 ml filled 04/19/2019 [facility]  . LANTUS SOLOSTAR 100 UNIT/ML Solostar Pen Inject 45 Units into the skin at bedtime.   [name] loperamide (IMODIUM) 2 MG capsule Take 2 mg by mouth 4 (four) times daily as needed for diarrhea or loose stools. 05/07/2019: #120 filled 04/19/2019 [facility]  . magnesium oxide (MAG-OX) 400 (241.3 Mg) MG tablet Take 1 tablet (400 mg total) by mouth 2 (two) times daily.   . Melatonin 3 MG TABS Take 2 tablets (6 mg total) by mouth at bedtime. 05/07/2019: #60 filled 04/19/2019 [facility]  . metoCLOPramide (REGLAN) 10 MG tablet Take 1 tablet (10 mg total) by mouth 3 (three) times daily before meals. 05/07/2019: #90 filled 04/30/2019 [facility]  . metoprolol succinate (TOPROL-XL) 25 MG 24 hr tablet Take 25 mg by mouth daily.   . nitroGLYCERIN (NITROSTAT) 0.4 MG SL tablet Place 1 tablet (0.4 mg total) under the tongue every 5 (five) minutes as needed for chest pain. 05/07/2019: #25 filled 12/13/2018 [facility]  . ondansetron (ZOFRAN-ODT) 4 MG disintegrating tablet Take 4 mg by mouth every 8 (eight) hours as needed.   . torsemide (DEMADEX) 20 MG tablet Take 2 tablets (40 mg total) by mouth daily.   . blood glucose  meter kit and supplies KIT Dispense based on patient and insurance preference. Use up to four times daily as directed. (FOR ICD-9 250.00, 250.01).   [name] dicyclomine (BENTYL) 20 MG tablet Take 1 tablet (20 mg total) by mouth 3 (three) times daily as needed for spasms (abdominal cramping). 05/07/2019: #90 filled 04/19/2019 [facility]  . pantoprazole (PROTONIX) 40 MG tablet Take 1  tablet (40 mg total) by mouth 2 (two) times daily. 05/07/2019: #60 filled 04/19/2019 [facility]   No facility-administered encounter medications on file as of 06/19/2019.    Goals Addressed            This Visit's Progress   . I been in the hospital 2-3 times for my Blood sugar (pt-stated)       CARE PLAN ENTRY (see longtitudinal plan of care for additional care plan information)  Current Barriers:  [name] Knowledge Deficits related to basic Diabetes pathophysiology and self care/management  Case Manager Clinical Goal(s):  Over the next 30 days, patient will demonstrate improved adherence to prescribed treatment plan for diabetes self care/management as evidenced by: remaining out of the hospital   Interventions:  . Provided education to patient about basic DM disease process . Reviewed medications with patient and discussed importance of medication adherence . Discussed plans with patient for ongoing care management follow up and provided patient with direct contact information for care management team . Provided patient with written educational materials related to hypo and hyperglycemia and importance of correct treatment . Advised patient, providing education and rationale, to check cbg and record, calling the office for findings outside established parameters.   . The patient states that she never has lows.  She only had a low 1 time and then she was clammy and disoriented. . When her blood sugar is high she will start to vomit.  The highest she stated was in the 400's. . I would like to send the patient some educational material on diabetes Living well with Diabetes, Cirrhosis, Calendar  Patient Self Care Activities:  . Self administers oral medications as prescribed . Attends all scheduled provider appointments  Initial goal documentation        Lab Results  Component Value Date   HGBA1C 8.3 (H) 05/07/2019    Follow up plan:  The care management team will reach out  to the patient again over the next 14 days.  The patient has been provided with contact information for the care management team and has been advised to call with any health related questions or concerns.   Ms. Lanpher was given information about Care Management services today including:  1. Care Management services include personalized support from designated clinical staff supervised by a physician, including individualized plan of care and coordination with other care providers 2. 24/7 contact phone numbers for assistance for urgent and routine care needs. 3. The patient may stop Care Management services at any time (effective at the end of the month) by phone call to the office staff.  Patient agreed to services and verbal consent obtained.  Lazaro Arms RN, BSN, [facility] Care Management Coordinator [place] Phone: [phone] Fax: [phone]

## 2019-06-19 NOTE — Patient Instructions (Signed)
Visit Information  Goals Addressed            This Visit's Progress   . I been in the hospital 2-3 times for my Blood sugar (pt-stated)       CARE PLAN ENTRY (see longtitudinal plan of care for additional care plan information)  Current Barriers:  [name] Knowledge Deficits related to basic Diabetes pathophysiology and self care/management  Case Manager Clinical Goal(s):  Over the next 30 days, patient will demonstrate improved adherence to prescribed treatment plan for diabetes self care/management as evidenced by: remaining out of the hospital   Interventions:  . Provided education to patient about basic DM disease process . Reviewed medications with patient and discussed importance of medication adherence . Discussed plans with patient for ongoing care management follow up and provided patient with direct contact information for care management team . Provided patient with written educational materials related to hypo and hyperglycemia and importance of correct treatment . Advised patient, providing education and rationale, to check cbg and record, calling the office for findings outside established parameters.   . The patient states that she never has lows.  She only had a low 1 time and then she was clammy and disoriented. . When her blood sugar is high she will start to vomit.  The highest she stated was in the 400's. . I would like to send the patient some educational material on diabetes Living well with Diabetes, Cirrhosis, Calendar  Patient Self Care Activities:  . Self administers oral medications as prescribed . Attends all scheduled provider appointments  Initial goal documentation        Deborah [name] was given information about Care Management services today including:  1. Care Management services include personalized support from designated clinical staff supervised by her physician, including individualized plan of care and coordination with other care  providers 2. 24/7 contact phone numbers for assistance for urgent and routine care needs. 3. The patient may stop CCM services at any time (effective at the end of the month) by phone call to the office staff.  Patient agreed to services and verbal consent obtained.   The patient verbalized understanding of instructions provided today and declined a print copy of patient instruction materials.   The care management team will reach out to the patient again over the next 14 days.  The patient has been provided with contact information for the care management team and has been advised to call with any health related questions or concerns.   Lazaro Arms RN, BSN, [facility] Care Management Coordinator [place] Phone: [phone] Fax: [phone]

## 2019-06-19 NOTE — Telephone Encounter (Signed)
Patient states that she is not available to come into office next week. Patient scheduled for 4/28 at 2:30pm.   Talbot Grumbling, RN

## 2019-06-22 ENCOUNTER — Other Ambulatory Visit: Payer: Self-pay

## 2019-06-22 ENCOUNTER — Other Ambulatory Visit: Payer: Self-pay | Admitting: Family Medicine

## 2019-06-22 ENCOUNTER — Emergency Department (HOSPITAL_COMMUNITY): Payer: Medicare Other

## 2019-06-22 ENCOUNTER — Inpatient Hospital Stay (HOSPITAL_COMMUNITY)
Admission: EM | Admit: 2019-06-22 | Discharge: 2019-06-26 | DRG: 638 | Disposition: A | Discharge status: Home / Self Care | Payer: Medicare Other | Attending: Family Medicine | Admitting: Family Medicine

## 2019-06-22 ENCOUNTER — Other Ambulatory Visit (HOSPITAL_COMMUNITY): Payer: Self-pay

## 2019-06-22 ENCOUNTER — Encounter (HOSPITAL_COMMUNITY): Payer: Self-pay

## 2019-06-22 DIAGNOSIS — E785 Hyperlipidemia, unspecified: Secondary | ICD-10-CM | POA: Diagnosis present

## 2019-06-22 DIAGNOSIS — K3184 Gastroparesis: Secondary | ICD-10-CM

## 2019-06-22 DIAGNOSIS — J441 Chronic obstructive pulmonary disease with (acute) exacerbation: Secondary | ICD-10-CM | POA: Diagnosis present

## 2019-06-22 DIAGNOSIS — M109 Gout, unspecified: Secondary | ICD-10-CM | POA: Diagnosis present

## 2019-06-22 DIAGNOSIS — Z79899 Other long term (current) drug therapy: Secondary | ICD-10-CM | POA: Diagnosis not present

## 2019-06-22 DIAGNOSIS — Z880 Allergy status to penicillin: Secondary | ICD-10-CM

## 2019-06-22 DIAGNOSIS — E669 Obesity, unspecified: Secondary | ICD-10-CM | POA: Diagnosis present

## 2019-06-22 DIAGNOSIS — E1165 Type 2 diabetes mellitus with hyperglycemia: Secondary | ICD-10-CM | POA: Diagnosis not present

## 2019-06-22 DIAGNOSIS — Z6841 Body Mass Index (BMI) 40.0 and over, adult: Secondary | ICD-10-CM | POA: Diagnosis not present

## 2019-06-22 DIAGNOSIS — E1122 Type 2 diabetes mellitus with diabetic chronic kidney disease: Secondary | ICD-10-CM | POA: Diagnosis present

## 2019-06-22 DIAGNOSIS — E1143 Type 2 diabetes mellitus with diabetic autonomic (poly)neuropathy: Secondary | ICD-10-CM

## 2019-06-22 DIAGNOSIS — I5022 Chronic systolic (congestive) heart failure: Secondary | ICD-10-CM | POA: Diagnosis present

## 2019-06-22 DIAGNOSIS — N1832 Chronic kidney disease, stage 3b: Secondary | ICD-10-CM | POA: Diagnosis present

## 2019-06-22 DIAGNOSIS — Z833 Family history of diabetes mellitus: Secondary | ICD-10-CM

## 2019-06-22 DIAGNOSIS — Z8249 Family history of ischemic heart disease and other diseases of the circulatory system: Secondary | ICD-10-CM | POA: Diagnosis not present

## 2019-06-22 DIAGNOSIS — I428 Other cardiomyopathies: Secondary | ICD-10-CM | POA: Diagnosis present

## 2019-06-22 DIAGNOSIS — Z885 Allergy status to narcotic agent status: Secondary | ICD-10-CM

## 2019-06-22 DIAGNOSIS — K219 Gastro-esophageal reflux disease without esophagitis: Secondary | ICD-10-CM | POA: Diagnosis present

## 2019-06-22 DIAGNOSIS — Z20822 Contact with and (suspected) exposure to covid-19: Secondary | ICD-10-CM | POA: Diagnosis present

## 2019-06-22 DIAGNOSIS — R0789 Other chest pain: Secondary | ICD-10-CM | POA: Diagnosis present

## 2019-06-22 DIAGNOSIS — Z794 Long term (current) use of insulin: Secondary | ICD-10-CM | POA: Diagnosis not present

## 2019-06-22 DIAGNOSIS — Z7982 Long term (current) use of aspirin: Secondary | ICD-10-CM

## 2019-06-22 DIAGNOSIS — E1140 Type 2 diabetes mellitus with diabetic neuropathy, unspecified: Secondary | ICD-10-CM

## 2019-06-22 DIAGNOSIS — M797 Fibromyalgia: Secondary | ICD-10-CM | POA: Diagnosis present

## 2019-06-22 DIAGNOSIS — I130 Hypertensive heart and chronic kidney disease with heart failure and stage 1 through stage 4 chronic kidney disease, or unspecified chronic kidney disease: Secondary | ICD-10-CM | POA: Diagnosis present

## 2019-06-22 DIAGNOSIS — N179 Acute kidney failure, unspecified: Secondary | ICD-10-CM | POA: Diagnosis not present

## 2019-06-22 DIAGNOSIS — Z8673 Personal history of transient ischemic attack (TIA), and cerebral infarction without residual deficits: Secondary | ICD-10-CM

## 2019-06-22 DIAGNOSIS — I480 Paroxysmal atrial fibrillation: Secondary | ICD-10-CM | POA: Diagnosis present

## 2019-06-22 DIAGNOSIS — E1110 Type 2 diabetes mellitus with ketoacidosis without coma: Principal | ICD-10-CM | POA: Diagnosis present

## 2019-06-22 DIAGNOSIS — Z7901 Long term (current) use of anticoagulants: Secondary | ICD-10-CM

## 2019-06-22 DIAGNOSIS — IMO0002 Reserved for concepts with insufficient information to code with codable children: Secondary | ICD-10-CM

## 2019-06-22 DIAGNOSIS — Z91041 Radiographic dye allergy status: Secondary | ICD-10-CM

## 2019-06-22 DIAGNOSIS — J302 Other seasonal allergic rhinitis: Secondary | ICD-10-CM | POA: Diagnosis present

## 2019-06-22 DIAGNOSIS — Z886 Allergy status to analgesic agent status: Secondary | ICD-10-CM

## 2019-06-22 DIAGNOSIS — I10 Essential (primary) hypertension: Secondary | ICD-10-CM | POA: Diagnosis present

## 2019-06-22 DIAGNOSIS — Z888 Allergy status to other drugs, medicaments and biological substances status: Secondary | ICD-10-CM

## 2019-06-22 LAB — BASIC METABOLIC PANEL
Anion gap: 16 — ABNORMAL HIGH (ref 5–15)
Anion gap: 11 (ref 5–15)
BUN: 27 mg/dL — ABNORMAL HIGH (ref 6–20)
BUN: 28 mg/dL — ABNORMAL HIGH (ref 6–20)
CO2: 16 mmol/L — ABNORMAL LOW (ref 22–32)
CO2: 21 mmol/L — ABNORMAL LOW (ref 22–32)
Calcium: 8.8 mg/dL — ABNORMAL LOW (ref 8.9–10.3)
Calcium: 8.4 mg/dL — ABNORMAL LOW (ref 8.9–10.3)
Chloride: 104 mmol/L (ref 98–111)
Chloride: 106 mmol/L (ref 98–111)
Creatinine, Ser: 1.38 mg/dL — ABNORMAL HIGH (ref 0.44–1.00)
Creatinine, Ser: 1.36 mg/dL — ABNORMAL HIGH (ref 0.44–1.00)
GFR calc Af Amer: 50 mL/min — ABNORMAL LOW (ref >60)
GFR calc Af Amer: 51 mL/min — ABNORMAL LOW (ref >60)
GFR calc non Af Amer: 43 mL/min — ABNORMAL LOW (ref >60)
GFR calc non Af Amer: 44 mL/min — ABNORMAL LOW (ref >60)
Glucose, Bld: 306 mg/dL — ABNORMAL HIGH (ref 70–99)
Glucose, Bld: 276 mg/dL — ABNORMAL HIGH (ref 70–99)
Potassium: 3.2 mmol/L — ABNORMAL LOW (ref 3.5–5.1)
Potassium: 3.3 mmol/L — ABNORMAL LOW (ref 3.5–5.1)
Sodium: 136 mmol/L (ref 135–145)
Sodium: 138 mmol/L (ref 135–145)

## 2019-06-22 LAB — CBG MONITORING, ED
Glucose-Capillary: 216 mg/dL — ABNORMAL HIGH (ref 70–99)
Glucose-Capillary: 258 mg/dL — ABNORMAL HIGH (ref 70–99)

## 2019-06-22 LAB — CBC
HCT: 34 % — ABNORMAL LOW (ref 36.0–46.0)
Hemoglobin: 10.7 g/dL — ABNORMAL LOW (ref 12.0–15.0)
MCH: 27 pg (ref 26.0–34.0)
MCHC: 31.5 g/dL (ref 30.0–36.0)
MCV: 85.9 fL (ref 80.0–100.0)
Platelets: 394 10*3/uL (ref 150–400)
RBC: 3.96 MIL/uL (ref 3.87–5.11)
RDW: 14.5 % (ref 11.5–15.5)
WBC: 14.5 10*3/uL — ABNORMAL HIGH (ref 4.0–10.5)
nRBC: 0 % (ref 0.0–0.2)

## 2019-06-22 LAB — HEPATIC FUNCTION PANEL
ALT: 19 U/L (ref 0–44)
AST: 21 U/L (ref 15–41)
Albumin: 3.4 g/dL — ABNORMAL LOW (ref 3.5–5.0)
Alkaline Phosphatase: 101 U/L (ref 38–126)
Bilirubin, Direct: 0.1 mg/dL (ref 0.0–0.2)
Indirect Bilirubin: 0.8 mg/dL (ref 0.3–0.9)
Total Bilirubin: 0.9 mg/dL (ref 0.3–1.2)
Total Protein: 7.6 g/dL (ref 6.5–8.1)

## 2019-06-22 LAB — I-STAT BETA HCG BLOOD, ED (MC, WL, AP ONLY): I-stat hCG, quantitative: <5 m[IU]/mL (ref <5)

## 2019-06-22 LAB — LIPASE, BLOOD: Lipase: 22 U/L (ref 11–51)

## 2019-06-22 LAB — BETA-HYDROXYBUTYRIC ACID: Beta-Hydroxybutyric Acid: 1.34 mmol/L — ABNORMAL HIGH (ref 0.05–0.27)

## 2019-06-22 LAB — TROPONIN I (HIGH SENSITIVITY)
Troponin I (High Sensitivity): 19 ng/L — ABNORMAL HIGH (ref <18)
Troponin I (High Sensitivity): 20 ng/L — ABNORMAL HIGH (ref <18)

## 2019-06-22 LAB — LACTIC ACID, PLASMA
Lactic Acid, Venous: 4.6 mmol/L (ref 0.5–1.9)
Lactic Acid, Venous: 3.5 mmol/L (ref 0.5–1.9)

## 2019-06-22 LAB — BRAIN NATRIURETIC PEPTIDE: B Natriuretic Peptide: 70.3 pg/mL (ref 0.0–100.0)

## 2019-06-22 MED ORDER — APIXABAN 5 MG PO TABS
5.0000 mg | ORAL_TABLET | Freq: Two times a day (BID) | ORAL | Status: DC
  Administered 2019-06-23 – 2019-06-26 (×7): 5 mg via ORAL
  Filled 2019-06-22 (×7): qty 1
  Filled 2019-06-22: qty 2

## 2019-06-22 MED ORDER — METOCLOPRAMIDE HCL 10 MG PO TABS
10.0000 mg | ORAL_TABLET | Freq: Three times a day (TID) | ORAL | Status: DC
  Administered 2019-06-23 – 2019-06-25 (×5): 10 mg via ORAL
  Filled 2019-06-22 (×6): qty 1

## 2019-06-22 MED ORDER — ONDANSETRON HCL 4 MG/2ML IJ SOLN
4.0000 mg | Freq: Four times a day (QID) | INTRAMUSCULAR | Status: DC | PRN
  Administered 2019-06-23 – 2019-06-24 (×3): 4 mg via INTRAVENOUS
  Filled 2019-06-22 (×3): qty 2

## 2019-06-22 MED ORDER — HYDRALAZINE HCL 25 MG PO TABS
25.0000 mg | ORAL_TABLET | Freq: Three times a day (TID) | ORAL | Status: DC
  Administered 2019-06-23 – 2019-06-26 (×11): 25 mg via ORAL
  Filled 2019-06-22 (×11): qty 1

## 2019-06-22 MED ORDER — LORATADINE 10 MG PO TABS
10.0000 mg | ORAL_TABLET | Freq: Every day | ORAL | Status: DC
  Administered 2019-06-23 – 2019-06-26 (×4): 10 mg via ORAL
  Filled 2019-06-22 (×4): qty 1

## 2019-06-22 MED ORDER — MELATONIN 3 MG PO TABS
6.0000 mg | ORAL_TABLET | Freq: Every day | ORAL | Status: DC
  Administered 2019-06-23 – 2019-06-25 (×4): 6 mg via ORAL
  Filled 2019-06-22 (×4): qty 2

## 2019-06-22 MED ORDER — DEXTROSE-NACL 5-0.45 % IV SOLN: INTRAVENOUS | Status: DC

## 2019-06-22 MED ORDER — AMLODIPINE BESYLATE 10 MG PO TABS
10.0000 mg | ORAL_TABLET | Freq: Every morning | ORAL | Status: DC
  Administered 2019-06-24 – 2019-06-25 (×2): 10 mg via ORAL
  Filled 2019-06-22 (×3): qty 1

## 2019-06-22 MED ORDER — INSULIN REGULAR(HUMAN) IN NACL 100-0.9 UT/100ML-% IV SOLN
INTRAVENOUS | Status: DC
  Administered 2019-06-22: 10 [IU]/h via INTRAVENOUS

## 2019-06-22 MED ORDER — INSULIN REGULAR(HUMAN) IN NACL 100-0.9 UT/100ML-% IV SOLN
INTRAVENOUS | Status: DC
  Filled 2019-06-22: qty 100

## 2019-06-22 MED ORDER — ASPIRIN EC 81 MG PO TBEC
81.0000 mg | DELAYED_RELEASE_TABLET | Freq: Every day | ORAL | Status: DC
  Administered 2019-06-24: 81 mg via ORAL
  Filled 2019-06-22 (×2): qty 1

## 2019-06-22 MED ORDER — DULOXETINE HCL 20 MG PO CPEP
40.0000 mg | ORAL_CAPSULE | Freq: Two times a day (BID) | ORAL | Status: DC
  Administered 2019-06-23 – 2019-06-26 (×7): 40 mg via ORAL
  Filled 2019-06-22 (×9): qty 2

## 2019-06-22 MED ORDER — ALBUTEROL SULFATE (2.5 MG/3ML) 0.083% IN NEBU: 2.5000 mg | INHALATION_SOLUTION | Freq: Four times a day (QID) | RESPIRATORY_TRACT | Status: DC | PRN

## 2019-06-22 MED ORDER — SODIUM CHLORIDE 0.9 % IV BOLUS
1000.0000 mL | Freq: Once | INTRAVENOUS | Status: AC
  Administered 2019-06-22: 1000 mL via INTRAVENOUS

## 2019-06-22 MED ORDER — METOPROLOL SUCCINATE ER 25 MG PO TB24
25.0000 mg | ORAL_TABLET | Freq: Every day | ORAL | Status: DC
  Administered 2019-06-23 – 2019-06-26 (×4): 25 mg via ORAL
  Filled 2019-06-22 (×4): qty 1

## 2019-06-22 MED ORDER — SODIUM CHLORIDE 0.9 % IV SOLN: INTRAVENOUS | Status: DC

## 2019-06-22 MED ORDER — DEXTROSE 50 % IV SOLN: 0.0000 mL | INTRAVENOUS | Status: DC | PRN

## 2019-06-22 MED ORDER — ONDANSETRON HCL 4 MG/2ML IJ SOLN
4.0000 mg | Freq: Once | INTRAMUSCULAR | Status: AC
  Administered 2019-06-22: 4 mg via INTRAVENOUS
  Filled 2019-06-22: qty 2

## 2019-06-22 MED ORDER — DICYCLOMINE HCL 20 MG PO TABS
20.0000 mg | ORAL_TABLET | Freq: Three times a day (TID) | ORAL | Status: DC | PRN
  Filled 2019-06-22: qty 1

## 2019-06-22 MED ORDER — HYDROMORPHONE HCL 1 MG/ML IJ SOLN
1.0000 mg | Freq: Once | INTRAMUSCULAR | Status: AC
  Administered 2019-06-22: 1 mg via INTRAMUSCULAR
  Filled 2019-06-22: qty 1

## 2019-06-22 MED ORDER — FLUTICASONE PROPIONATE 50 MCG/ACT NA SUSP: 2.0000 | Freq: Every day | NASAL | Status: DC | PRN

## 2019-06-22 MED ORDER — POTASSIUM CHLORIDE 10 MEQ/100ML IV SOLN: 10.0000 meq | INTRAVENOUS | Status: AC

## 2019-06-22 MED ORDER — ATORVASTATIN CALCIUM 10 MG PO TABS
10.0000 mg | ORAL_TABLET | Freq: Every day | ORAL | Status: DC
  Administered 2019-06-23 – 2019-06-25 (×3): 10 mg via ORAL
  Filled 2019-06-22 (×3): qty 1

## 2019-06-22 MED ORDER — PANTOPRAZOLE SODIUM 40 MG PO TBEC
40.0000 mg | DELAYED_RELEASE_TABLET | Freq: Two times a day (BID) | ORAL | Status: DC
  Administered 2019-06-23 – 2019-06-26 (×8): 40 mg via ORAL
  Filled 2019-06-22 (×8): qty 1

## 2019-06-22 MED ORDER — SODIUM CHLORIDE 0.9% FLUSH: 10.0000 mL | INTRAVENOUS | Status: DC | PRN

## 2019-06-22 MED ORDER — HYDROMORPHONE HCL 1 MG/ML IJ SOLN: 1.0000 mg | Freq: Once | INTRAMUSCULAR | Status: DC

## 2019-06-22 NOTE — H&P (Signed)
History and Physical    Deborah [name] JYN:[id number] DOB: Jul 26, 1965 DOA: 06/22/2019  PCP: [name], DO  Patient coming from: Home  I have personally briefly reviewed patient's old medical records in [place]  Chief Complaint: DKA  HPI: Deborah [name] is a 54 y.o. female with medical history significant of DM2, gastroparesis, HFrEF.  Pt just admitted for DKA earlier this month.  Pt returns to ED with c/o N/V abd pain, chest discomfort, fatigue.  Symptoms similar to prior DKA.  Taking meds as directed.  No dysuria, no diarrhea.   ED Course: Pt with DKA, also lactic acidosis (suspect dehydration which patient endorses feeling as well).  On DKA pathway.   Review of Systems: As per HPI, otherwise all review of systems negative.  Past Medical History:  Diagnosis Date  . Acute back pain with sciatica, left   . Acute back pain with sciatica, right   . Acute on chronic congestive heart failure ([place])   . AKI (acute kidney injury) ([place])   . Anemia, unspecified   . Chest pain 12/2015  . Chronic pain   . Chronic systolic CHF (congestive heart failure) ([place])   . Cough   . Diabetes mellitus   . Esophageal reflux   . Fibromyalgia   . Flu-like symptoms 04/05/2019  . Gastric ulcer   . Gastroparesis   . Gout   . Hyperlipidemia   . Hypertension   . Hypokalemia   . Hypomagnesemia   . Lumbosacral stenosis   . NICM (nonischemic cardiomyopathy) ([place])   . Obesity   . PAF (paroxysmal atrial fibrillation) ([place])   . Pneumonia   . Stroke ([place]) 02/2011  . Vitamin B12 deficiency anemia     Past Surgical History:  Procedure Laterality Date  . CATARACT EXTRACTION  01/2014  . CHOLECYSTECTOMY       reports that she has never smoked. She has never used smokeless tobacco. She reports that she does not drink alcohol or use drugs.  Allergies  Allergen Reactions  . Contrast Media [Iodinated Diagnostic Agents] Anaphylaxis    Cardiac arrest  . Diazepam Shortness  Of Breath  . Isovue [Iopamidol] Anaphylaxis    Patient had seizure like activity and then code post 100 cc of isovue 300  . Lisinopril Anaphylaxis    Tongue and mouth swelling  . Penicillins Palpitations    Has patient had a PCN reaction causing immediate rash, facial/tongue/throat swelling, SOB or lightheadedness with hypotension: Yes, heart races Has patient had a PCN reaction causing severe rash involving mucus membranes or skin necrosis: No Has patient had a PCN reaction that required hospitalization: Yes  Has patient had a PCN reaction occurring within the last 10 years: No   . Acetaminophen Nausea Only and Other (See Comments)    Irritates stomach ulcer  Abdominal pain  . Tolmetin Nausea Only and Other (See Comments)    ULCER  . Aspirin Other (See Comments)    Irritates stomach ulcer   . Food Hives and Swelling    Reaction to Carrots, ketchup   . Nsaids Other (See Comments)    ULCER  . Tramadol Nausea And Vomiting    Family History  Problem Relation Age of Onset  . Diabetes Mother   . Diabetes Father   . Heart disease Father   . Diabetes Sister   . Congestive Heart Failure Sister 22  . Diabetes Brother      Prior to Admission medications   Medication Sig Start Date End  Date Taking? Authorizing Provider  albuterol (PROVENTIL) (2.5 MG/3ML) 0.083% nebulizer solution Take 3 mLs (2.5 mg total) by nebulization every 6 (six) hours as needed for wheezing or shortness of breath. 04/06/19  Yes Scot Jun, FNP  amLODipine (NORVASC) 10 MG tablet Take 10 mg by mouth every morning. 04/19/19  Yes [provider]  apixaban (ELIQUIS) 5 MG TABS tablet Take 1 tablet (5 mg total) by mouth 2 (two) times daily. 05/09/19  Yes Gifford Shave, MD  aspirin EC 81 MG tablet Take 81 mg by mouth daily.   Yes [provider]  atorvastatin (LIPITOR) 10 MG tablet Take 1 tablet (10 mg total) by mouth daily. 12/13/18  Yes Lockamy, Christia [place], DO  cetirizine (ZYRTEC) 10 MG tablet  Take 1 tablet (10 mg total) by mouth daily. 12/13/18  Yes Lockamy, Timothy, DO  dicyclomine (BENTYL) 20 MG tablet TAKE 1 TABLET (20 MG TOTAL) BY MOUTH 3 (THREE) TIMES DAILY AS NEEDED FOR SPASMS (ABDOMINAL CRAMPING). 06/22/19 07/22/19 Yes Lockamy, Timothy, DO  DULoxetine HCl 40 MG CPEP TAKE ONE CAPSULE BY MOUTH TWICE DAILY 06/04/19  Yes Lockamy, Timothy, DO  fluticasone (FLONASE) 50 MCG/ACT nasal spray Place 2 sprays into both nostrils daily as needed for allergies or rhinitis. 12/19/18  Yes Rai, Ripudeep K, MD  hydrALAZINE (APRESOLINE) 25 MG tablet TAKE ONE TABLET BY MOUTH THREE TIMES A DAY. FOR BLOOD PRESSURE Patient taking differently: Take 25 mg by mouth 3 (three) times daily. For blood pressure 04/19/19  Yes Lockamy, Timothy, DO  insulin aspart (NOVOLOG FLEXPEN) 100 UNIT/ML FlexPen Inject 30 Units into the skin 4 (four) times daily.   Yes [provider]  LANTUS SOLOSTAR 100 UNIT/ML Solostar Pen Inject 45 Units into the skin at bedtime. 06/14/19  Yes Pokhrel, Laxman, MD  loperamide (IMODIUM) 2 MG capsule Take 2 mg by mouth 4 (four) times daily as needed for diarrhea or loose stools.   Yes [provider]  magnesium oxide (MAG-OX) 400 (241.3 Mg) MG tablet Take 1 tablet (400 mg total) by mouth 2 (two) times daily. 06/14/19  Yes Pokhrel, Laxman, MD  Melatonin 3 MG TABS Take 2 tablets (6 mg total) by mouth at bedtime. 12/13/18  Yes Lockamy, Timothy, DO  metoCLOPramide (REGLAN) 10 MG tablet Take 1 tablet (10 mg total) by mouth 3 (three) times daily before meals. 12/19/18  Yes Rai, Ripudeep K, MD  metoprolol succinate (TOPROL-XL) 25 MG 24 hr tablet Take 25 mg by mouth daily. 06/01/19  Yes [provider]  nitroGLYCERIN (NITROSTAT) 0.4 MG SL tablet Place 1 tablet (0.4 mg total) under the tongue every 5 (five) minutes as needed for chest pain. 12/13/18  Yes Lockamy, Timothy, DO  ondansetron (ZOFRAN-ODT) 4 MG disintegrating tablet Take 4 mg by mouth every 8 (eight) hours as needed. 06/07/19   Yes [provider]  pantoprazole (PROTONIX) 40 MG tablet Take 1 tablet (40 mg total) by mouth 2 (two) times daily. 03/17/19 06/22/19 Yes Welborn, Ryan, DO  torsemide (DEMADEX) 20 MG tablet Take 2 tablets (40 mg total) by mouth daily. 06/04/19  Yes Lockamy, Timothy, DO  blood glucose meter kit and supplies KIT Dispense based on patient and insurance preference. Use up to four times daily as directed. (FOR ICD-9 250.00, 250.01). 12/13/18   [name], DO    Physical Exam: Vitals:   06/22/19 2134 06/22/19 2200 06/22/19 2230 06/22/19 2300  BP: (!) 160/92 (!) 137/52 129/60 129/82  Pulse: (!) 116 (!) 110 (!) 109 (!) 110  Resp: 17 (!)  25 (!) 23 (!) 24  Temp:      TempSrc:      SpO2: 100% 93% 93% 95%  Weight:      Height:        Constitutional: NAD, calm, comfortable Eyes: PERRL, lids and conjunctivae normal ENMT: Mucous membranes are dry. Posterior pharynx clear of any exudate or lesions.Normal dentition.  Neck: normal, supple, no masses, no thyromegaly Respiratory: clear to auscultation bilaterally, no wheezing, no crackles. Normal respiratory effort. No accessory muscle use.  Cardiovascular: Regular rate and rhythm, no murmurs / rubs / gallops. No extremity edema. 2+ pedal pulses. No carotid bruits.  Abdomen: no tenderness, no masses palpated. No hepatosplenomegaly. Bowel sounds positive.  Musculoskeletal: no clubbing / cyanosis. No joint deformity upper and lower extremities. Good ROM, no contractures. Normal muscle tone.  Skin: no rashes, lesions, ulcers. No induration Neurologic: CN 2-12 grossly intact. Sensation intact, DTR normal. Strength 5/5 in all 4.  Psychiatric: Normal judgment and insight. Alert and oriented x 3. Normal mood.    Labs on Admission: I have personally reviewed following labs and imaging studies  CBC: Recent Labs  Lab 06/22/19 2123  WBC 14.5*  HGB 10.7*  HCT 34.0*  MCV 85.9  PLT 638   Basic Metabolic Panel: Recent Labs  Lab 06/22/19 2123    NA 136  K 3.2*  CL 104  CO2 16*  GLUCOSE 306*  BUN 27*  CREATININE 1.38*  CALCIUM 8.8*   GFR: Estimated Creatinine Clearance: 70.1 mL/min (A) (by C-G formula based on SCr of 1.38 mg/dL (H)). Liver Function Tests: Recent Labs  Lab 06/22/19 2123  AST 21  ALT 19  ALKPHOS 101  BILITOT 0.9  PROT 7.6  ALBUMIN 3.4*   Recent Labs  Lab 06/22/19 2123  LIPASE 22   No results for input(s): AMMONIA in the last 168 hours. Coagulation Profile: No results for input(s): INR, PROTIME in the last 168 hours. Cardiac Enzymes: No results for input(s): CKTOTAL, CKMB, CKMBINDEX, TROPONINI in the last 168 hours. BNP (last 3 results) No results for input(s): PROBNP in the last 8760 hours. HbA1C: No results for input(s): HGBA1C in the last 72 hours. CBG: Recent Labs  Lab 06/22/19 1911 06/22/19 2315  GLUCAP 216* 258*   Lipid Profile: No results for input(s): CHOL, HDL, LDLCALC, TRIG, CHOLHDL, LDLDIRECT in the last 72 hours. Thyroid Function Tests: No results for input(s): TSH, T4TOTAL, FREET4, T3FREE, THYROIDAB in the last 72 hours. Anemia Panel: No results for input(s): VITAMINB12, FOLATE, FERRITIN, TIBC, IRON, RETICCTPCT in the last 72 hours. Urine analysis:    Component Value Date/Time   COLORURINE AMBER (A) 06/10/2019 1602   APPEARANCEUR CLOUDY (A) 06/10/2019 1602   LABSPEC 1.016 06/10/2019 1602   PHURINE 5.0 06/10/2019 1602   GLUCOSEU >=500 (A) 06/10/2019 1602   HGBUR LARGE (A) 06/10/2019 1602   BILIRUBINUR NEGATIVE 06/10/2019 1602   KETONESUR 20 (A) 06/10/2019 1602   PROTEINUR >=300 (A) 06/10/2019 1602   UROBILINOGEN 0.2 10/02/2013 2108   NITRITE NEGATIVE 06/10/2019 1602   LEUKOCYTESUR TRACE (A) 06/10/2019 1602    Radiological Exams on Admission: DG Chest 2 View  Result Date: 06/22/2019 CLINICAL DATA:  Vomiting, central chest and abdominal pain, history of diabetic ketoacidosis EXAM: CHEST - 2 VIEW COMPARISON:  06/10/2019 FINDINGS: The heart size and mediastinal  contours are within normal limits. Both lungs are clear. The visualized skeletal structures are unremarkable. IMPRESSION: No active cardiopulmonary disease. Electronically Signed   By: Randa Ngo M.D.   On: 06/22/2019  19:24    EKG: Independently reviewed.  Assessment/Plan Principal Problem:   DKA (diabetic ketoacidoses) (HCC) Active Problems:   Essential hypertension, benign   Uncontrolled type 2 diabetes mellitus with diabetic neuropathy, with long-term current use of insulin (HCC)   Diabetic gastroparesis (HCC)   Chronic systolic CHF (congestive heart failure) ([place])    1. DKA - 1. DKA pathway 2. 2L NS bolus + 125 cc/hr NS + 75 cc/hr D5 half 3. Insulin gtt per pathway 4. 4 runs IV K 5. BMP Q4H 2. Chronic systolic CHF - 1. Hold home torsemide 2. Watch for fluid overload with treatment of DKA, think she is grossly dehydrated at the moment though with lactic acidosis, etc. 3. HTN - 1. Cont home BP meds 4. Diabetic gastroparesis - 1. Cont reglan 2. Unclear if in exacerbation or if N/V abd pain just due to DKA 3. Clear DKA up first and then see 5. H/O A.Fib? - 1. Looks like this is why shes on eliquis according to DC summary from last admit, though not in problem list. 2. S.Tach at the moment 3. Tele monitor 4. Cont eliquis  5. Cont toprol  DVT prophylaxis: Eliquis Code Status: Full Family Communication: No family in room Disposition Plan: Home after DKA resolved and taking POs Consults called: None Admission status: Admit to inpatient  Severity of Illness: The appropriate patient status for this patient is INPATIENT. Inpatient status is judged to be reasonable and necessary in order to provide the required intensity of service to ensure the patient's safety. The patient's presenting symptoms, physical exam findings, and initial radiographic and laboratory data in the context of their chronic comorbidities is felt to place them at high risk for further clinical  deterioration. Furthermore, it is not anticipated that the patient will be medically stable for discharge from the hospital within 2 midnights of admission. The following factors support the patient status of inpatient.   IP status for treatment of DKA.   * I certify that at the point of admission it is my clinical judgment that the patient will require inpatient hospital care spanning beyond 2 midnights from the point of admission due to high intensity of service, high risk for further deterioration and high frequency of surveillance required.*    Corra Kaine M. DO [facility]  How to contact the [facility] Attending or Consulting provider [place] or covering provider during after hours [place], for this patient?  1. Check the care team in [facility] and look for a) attending/consulting [facility] provider listed and b) the [facility] team listed 2. Log into [email or web address]  Amion Physician Scheduling and messaging for groups and whole hospitals  On call and physician scheduling software for group practices, residents, hospitalists and other medical providers for call, clinic, rotation and shift schedules. On[employer] is a hospital-wide system for scheduling doctors and paging doctors on call. EasyPlot is for scientific plotting and data analysis.  [email or web address]  and use [place]'s universal password to access. If you do not have the password, please contact the hospital operator.  3. Locate the [facility] provider you are looking for under [facility] and page to a number that you can be directly reached. 4. If you still have difficulty reaching the provider, please page the [facility] (Director on Call) for the Hospitalists listed on amion for assistance.  06/22/2019, 11:35 PM

## 2019-06-22 NOTE — ED Notes (Signed)
IV Team at Bedside. 

## 2019-06-22 NOTE — ED Provider Notes (Signed)
[facility] DEPT Provider Note   [name]: [id number] Arrival date & time: 06/22/19  1853     History Chief Complaint  Patient presents with  . Chest Pain    Deborah [name] is a 54 y.o. female.  The history is provided by the patient and medical records. No language interpreter was used.  Emesis Severity:  Severe Duration:  1 day Timing:  Constant Quality:  Stomach contents Progression:  Unchanged Chronicity:  Recurrent Recent urination:  Normal Relieved by:  Nothing Worsened by:  Nothing Ineffective treatments:  None tried Associated symptoms: abdominal pain and cough   Associated symptoms: no chills, no diarrhea, no fever, no headaches, no myalgias and no URI   Risk factors: diabetes        Past Medical History:  Diagnosis Date  . Acute back pain with sciatica, left   . Acute back pain with sciatica, right   . Acute on chronic congestive heart failure ([place])   . AKI (acute kidney injury) ([place])   . Anemia, unspecified   . Chest pain 12/2015  . Chronic pain   . Chronic systolic CHF (congestive heart failure) ([place])   . Cough   . Diabetes mellitus   . Esophageal reflux   . Fibromyalgia   . Flu-like symptoms 04/05/2019  . Gastric ulcer   . Gastroparesis   . Gout   . Hyperlipidemia   . Hypertension   . Hypokalemia   . Hypomagnesemia   . Lumbosacral stenosis   . NICM (nonischemic cardiomyopathy) ([place])   . Obesity   . PAF (paroxysmal atrial fibrillation) ([place])   . Pneumonia   . Stroke ([place]) 02/2011  . Vitamin B12 deficiency anemia     Patient Active Problem List   Diagnosis Date Noted  . Nonalcoholic fatty liver disease 06/05/2019  . Chronic diarrhea   . COPD exacerbation ([place]) 05/08/2019  . Acute on chronic HFrEF (heart failure with reduced ejection fraction) ([place])   . High risk social situation   . Restrictive lung disease secondary to obesity   . Physical deconditioning   . [name] 05/07/2019  . History  of gastric ulcer   . Uncontrolled type 2 diabetes mellitus with hyperglycemia ([place])   . Fibroid uterus 02/23/2019  . Congestion of nasal sinus 01/24/2019  . Chronic systolic CHF (congestive heart failure) ([place]) 12/19/2018  . Intractable nausea and vomiting 12/17/2018  . Hypoxia 12/17/2018  . Hyperglycemia 12/17/2018  . Elevated troponin I level   . Vomiting 07/18/2018  . Abdominal pain 07/17/2018  . Hyperkalemia 07/17/2018  . Cardiac arrest ([place]) 11/29/2017  . Pelvic mass 11/29/2017  . Leukocytosis 11/29/2017  . Anxiety 11/29/2017  . Allergic reaction to contrast media, initial encounter 11/28/2017  . Palliative care encounter   . Back pain 03/19/2017  . Stroke (cerebrum) ([place]) 03/19/2017  . GERD (gastroesophageal reflux disease) 03/19/2017  . Depression 03/19/2017  . Morbid obesity ([place])   . Urinary tract infection 08/16/2016  . Normocytic normochromic anemia 08/16/2016  . Gastroparesis 08/16/2016  . Intractable vomiting with nausea 06/17/2016  . Diabetic gastroparesis ([place]) 06/05/2016  . Gout 06/05/2016  . AKI (acute kidney injury) ([place]) 12/06/2015  . Chest pain 09/26/2015  . Hypokalemia 09/26/2015  . Hypomagnesemia 09/26/2015  . Nausea and vomiting 08/20/2015  . Gout flare 05/27/2015  . Lower abdominal pain 05/26/2015  . DKA (diabetic ketoacidoses) ([place]) 05/25/2015  . Uncontrolled type 2 diabetes mellitus with diabetic neuropathy, with long-term current use of insulin ([place]) 05/25/2015  .  Type 2 diabetes mellitus with hyperglycemia, with long-term current use of insulin ([place]) 05/25/2015  . CKD (chronic kidney disease), stage II 05/25/2015  . Essential hypertension, benign 09/28/2013    Past Surgical History:  Procedure Laterality Date  . CATARACT EXTRACTION  01/2014  . CHOLECYSTECTOMY       OB History   No obstetric history on file.     Family History  Problem Relation Age of Onset  . Diabetes Mother   . Diabetes Father   . Heart disease Father   .  Diabetes Sister   . Congestive Heart Failure Sister 15  . Diabetes Brother     Social History   Tobacco Use  . Smoking status: Never Smoker  . Smokeless tobacco: Never Used  Substance Use Topics  . Alcohol use: No  . Drug use: No    Home Medications Prior to Admission medications   Medication Sig Start Date End Date Taking? Authorizing Provider  albuterol (PROVENTIL) (2.5 MG/3ML) 0.083% nebulizer solution Take 3 mLs (2.5 mg total) by nebulization every 6 (six) hours as needed for wheezing or shortness of breath. 04/06/19   Scot Jun, FNP  amLODipine (NORVASC) 10 MG tablet Take 10 mg by mouth every morning. 04/19/19   [provider]  apixaban (ELIQUIS) 5 MG TABS tablet Take 1 tablet (5 mg total) by mouth 2 (two) times daily. 05/09/19   Gifford Shave, MD  aspirin EC 81 MG tablet Take 81 mg by mouth daily.    [provider]  atorvastatin (LIPITOR) 10 MG tablet Take 1 tablet (10 mg total) by mouth daily. 12/13/18   [name], DO  blood glucose meter kit and supplies KIT Dispense based on patient and insurance preference. Use up to four times daily as directed. (FOR ICD-9 250.00, 250.01). 12/13/18   [name], DO  cetirizine (ZYRTEC) 10 MG tablet Take 1 tablet (10 mg total) by mouth daily. 12/13/18   [name], DO  dicyclomine (BENTYL) 20 MG tablet TAKE 1 TABLET (20 MG TOTAL) BY MOUTH 3 (THREE) TIMES DAILY AS NEEDED FOR SPASMS (ABDOMINAL CRAMPING). 06/22/19 07/22/19  [name], DO  DULoxetine HCl 40 MG CPEP TAKE ONE CAPSULE BY MOUTH TWICE DAILY 06/04/19   Lockamy, Timothy, DO  fluticasone (FLONASE) 50 MCG/ACT nasal spray Place 2 sprays into both nostrils daily as needed for allergies or rhinitis. 12/19/18   Rai, Ripudeep K, MD  hydrALAZINE (APRESOLINE) 25 MG tablet TAKE ONE TABLET BY MOUTH THREE TIMES A DAY. FOR BLOOD PRESSURE Patient taking differently: Take 25 mg by mouth 3 (three) times daily. For blood pressure 04/19/19   Lockamy, Timothy,  DO  insulin aspart (NOVOLOG FLEXPEN) 100 UNIT/ML FlexPen Inject 30 Units into the skin 4 (four) times daily.    [provider]  LANTUS SOLOSTAR 100 UNIT/ML Solostar Pen Inject 45 Units into the skin at bedtime. 06/14/19   Pokhrel, Corrie Mckusick, MD  loperamide (IMODIUM) 2 MG capsule Take 2 mg by mouth 4 (four) times daily as needed for diarrhea or loose stools.    [provider]  magnesium oxide (MAG-OX) 400 (241.3 Mg) MG tablet Take 1 tablet (400 mg total) by mouth 2 (two) times daily. 06/14/19   Pokhrel, Corrie Mckusick, MD  Melatonin 3 MG TABS Take 2 tablets (6 mg total) by mouth at bedtime. 12/13/18   [name], DO  metoCLOPramide (REGLAN) 10 MG tablet Take 1 tablet (10 mg total) by mouth 3 (three) times daily before meals. 12/19/18   [name], MD  metoprolol succinate (TOPROL-XL) 25 MG 24 hr tablet Take 25 mg by mouth daily. 06/01/19   [provider]  nitroGLYCERIN (NITROSTAT) 0.4 MG SL tablet Place 1 tablet (0.4 mg total) under the tongue every 5 (five) minutes as needed for chest pain. 12/13/18   Lockamy, Christia [place], DO  ondansetron (ZOFRAN-ODT) 4 MG disintegrating tablet Take 4 mg by mouth every 8 (eight) hours as needed. 06/07/19   [provider]  pantoprazole (PROTONIX) 40 MG tablet Take 1 tablet (40 mg total) by mouth 2 (two) times daily. 03/17/19 06/10/19  Lurline Del, DO  torsemide (DEMADEX) 20 MG tablet Take 2 tablets (40 mg total) by mouth daily. 06/04/19   [name], DO    Allergies    Contrast media [iodinated diagnostic agents], Diazepam, Isovue [iopamidol], Lisinopril, Penicillins, Acetaminophen, Tolmetin, Aspirin, Food, Nsaids, and Tramadol  Review of Systems   Review of Systems  Constitutional: Positive for fatigue. Negative for chills, diaphoresis and fever.  HENT: Negative for congestion.   Eyes: Negative for visual disturbance.  Respiratory: Positive for cough, chest tightness and shortness of breath. Negative for wheezing.     Cardiovascular: Positive for chest pain and leg swelling (mild). Negative for palpitations.  Gastrointestinal: Positive for abdominal pain, nausea and vomiting. Negative for constipation and diarrhea.  Genitourinary: Negative for dysuria and flank pain.  Musculoskeletal: Negative for back pain, myalgias, neck pain and neck stiffness.  Neurological: Negative for dizziness, light-headedness and headaches.  Psychiatric/Behavioral: Negative for agitation.  All other systems reviewed and are negative.   Physical Exam Updated Vital Signs BP (!) 153/100 (BP Location: Right Arm)   Pulse (!) 122   Temp (!) 97.5 F (36.4 C) (Oral)   Resp 18   Ht _0  (1.676 m)   Wt (!) 152 kg   LMP 10/10/2012   SpO2 100%   BMI 54.07 kg/m   Physical Exam Vitals and nursing note reviewed.  Constitutional:      General: She is not in acute distress.    Appearance: She is well-developed. She is ill-appearing. She is not toxic-appearing or diaphoretic.  HENT:     Head: Normocephalic and atraumatic.     Right Ear: External ear normal.     Left Ear: External ear normal.     Nose: Nose normal.     Mouth/Throat:     Mouth: Mucous membranes are dry.     Pharynx: No oropharyngeal exudate or posterior oropharyngeal erythema.  Eyes:     Extraocular Movements: Extraocular movements intact.     Conjunctiva/sclera: Conjunctivae normal.     Pupils: Pupils are equal, round, and reactive to light.  Cardiovascular:     Rate and Rhythm: Regular rhythm. Tachycardia present.     Heart sounds: Normal heart sounds.  Pulmonary:     Effort: Tachypnea present. No respiratory distress.     Breath sounds: No stridor. No wheezing or rhonchi.  Chest:     Chest wall: Tenderness present.  Abdominal:     General: Abdomen is flat. There is no distension.     Tenderness: There is abdominal tenderness. There is no right CVA tenderness, left CVA tenderness or rebound.  Musculoskeletal:     Cervical back: Normal range of motion  and neck supple. No tenderness.     Right lower leg: No tenderness. Edema present.     Left lower leg: No tenderness. Edema present.  Skin:    General: Skin is warm.     Coloration: Skin is not pale.  Findings: No bruising, erythema or rash.  Neurological:     General: No focal deficit present.     Mental Status: She is alert and oriented to person, place, and time.     Motor: No abnormal muscle tone.     Coordination: Coordination normal.     Deep Tendon Reflexes: Reflexes are normal and symmetric.  Psychiatric:        Mood and Affect: Mood normal.     ED Results / Procedures / Treatments   Labs (all labs ordered are listed, but only abnormal results are displayed) Labs Reviewed  BASIC METABOLIC PANEL - Abnormal; Notable for the following components:      Result Value   Potassium 3.2 (*)    CO2 16 (*)    Glucose, Bld 306 (*)    BUN 27 (*)    Creatinine, Ser 1.38 (*)    Calcium 8.8 (*)    GFR calc non Af Amer 43 (*)    GFR calc Af Amer 50 (*)    Anion gap 16 (*)    All other components within normal limits  CBC - Abnormal; Notable for the following components:   WBC 14.5 (*)    Hemoglobin 10.7 (*)    HCT 34.0 (*)    All other components within normal limits  HEPATIC FUNCTION PANEL - Abnormal; Notable for the following components:   Albumin 3.4 (*)    All other components within normal limits  BETA-HYDROXYBUTYRIC ACID - Abnormal; Notable for the following components:   Beta-Hydroxybutyric Acid 1.34 (*)    All other components within normal limits  LACTIC ACID, PLASMA - Abnormal; Notable for the following components:   Lactic Acid, Venous 4.6 (*)    All other components within normal limits  LACTIC ACID, PLASMA - Abnormal; Notable for the following components:   Lactic Acid, Venous 3.5 (*)    All other components within normal limits  BASIC METABOLIC PANEL - Abnormal; Notable for the following components:   Potassium 3.3 (*)    CO2 21 (*)    Glucose, Bld 276 (*)     BUN 28 (*)    Creatinine, Ser 1.36 (*)    Calcium 8.4 (*)    GFR calc non Af Amer 44 (*)    GFR calc Af Amer 51 (*)    All other components within normal limits  BLOOD GAS, VENOUS - Abnormal; Notable for the following components:   pCO2, Ven 42.1 (*)    pO2, Ven 52.3 (*)    Acid-base deficit 4.5 (*)    All other components within normal limits  CBG MONITORING, ED - Abnormal; Notable for the following components:   Glucose-Capillary 216 (*)    All other components within normal limits  CBG MONITORING, ED - Abnormal; Notable for the following components:   Glucose-Capillary 258 (*)    All other components within normal limits  CBG MONITORING, ED - Abnormal; Notable for the following components:   Glucose-Capillary 244 (*)    All other components within normal limits  TROPONIN I (HIGH SENSITIVITY) - Abnormal; Notable for the following components:   Troponin I (High Sensitivity) 19 (*)    All other components within normal limits  TROPONIN I (HIGH SENSITIVITY) - Abnormal; Notable for the following components:   Troponin I (High Sensitivity) 20 (*)    All other components within normal limits  URINE CULTURE  SARS CORONAVIRUS 2 (TAT 6-24 HRS)  LIPASE, BLOOD  BRAIN NATRIURETIC PEPTIDE  URINALYSIS, ROUTINE  W REFLEX MICROSCOPIC  BASIC METABOLIC PANEL  BASIC METABOLIC PANEL  BASIC METABOLIC PANEL  BASIC METABOLIC PANEL  I-STAT BETA HCG BLOOD, ED ([facility], [facility], [facility] ONLY)    EKG None  Radiology DG Chest 2 View  Result Date: 06/22/2019 CLINICAL DATA:  Vomiting, central chest and abdominal pain, history of diabetic ketoacidosis EXAM: CHEST - 2 VIEW COMPARISON:  06/10/2019 FINDINGS: The heart size and mediastinal contours are within normal limits. Both lungs are clear. The visualized skeletal structures are unremarkable. IMPRESSION: No active cardiopulmonary disease. Electronically Signed   By: Randa Ngo M.D.   On: 06/22/2019 19:24    Procedures Procedures (including critical care  time)  CRITICAL CARE Performed by: Deborah [name] Total critical care time: 35  minutes Critical care time was exclusive of separately billable procedures and treating other patients. Critical care was necessary to treat or prevent imminent or life-threatening deterioration. Critical care was time spent personally by me on the following activities: development of treatment plan with patient and/or surrogate as well as nursing, discussions with consultants, evaluation of patient's response to treatment, examination of patient, obtaining history from patient or surrogate, ordering and performing treatments and interventions, ordering and review of laboratory studies, ordering and review of radiographic studies, pulse oximetry and re-evaluation of patient's condition.  Medications Ordered in ED Medications  sodium chloride flush (NS) 0.9 % injection 10-40 mL (has no administration in time range)  potassium chloride 10 mEq in 100 mL IVPB (has no administration in time range)  insulin regular, human (MYXREDLIN) 100 units/ 100 mL infusion (8.5 Units/hr Intravenous Rate/Dose Change 06/23/19 0048)  0.9 %  sodium chloride infusion (has no administration in time range)  dextrose 5 %-0.45 % sodium chloride infusion ( Intravenous New Bag/Given 06/23/19 0045)  dextrose 50 % solution 0-50 mL (has no administration in time range)  metoprolol succinate (TOPROL-XL) 24 hr tablet 25 mg (has no administration in time range)  pantoprazole (PROTONIX) EC tablet 40 mg (has no administration in time range)  apixaban (ELIQUIS) tablet 5 mg (has no administration in time range)  amLODipine (NORVASC) tablet 10 mg (has no administration in time range)  hydrALAZINE (APRESOLINE) tablet 25 mg (has no administration in time range)  DULoxetine HCl CPEP 1 capsule (has no administration in time range)  fluticasone (FLONASE) 50 MCG/ACT nasal spray 2 spray (has no administration in time range)  dicyclomine (BENTYL) tablet 20  mg (has no administration in time range)  metoCLOPramide (REGLAN) tablet 10 mg (has no administration in time range)  aspirin EC tablet 81 mg (has no administration in time range)  atorvastatin (LIPITOR) tablet 10 mg (has no administration in time range)  albuterol (PROVENTIL) (2.5 MG/3ML) 0.083% nebulizer solution 2.5 mg (has no administration in time range)  loratadine (CLARITIN) tablet 10 mg (has no administration in time range)  melatonin tablet 6 mg (has no administration in time range)  ondansetron (ZOFRAN) injection 4 mg (has no administration in time range)  sodium chloride 0.9 % bolus 1,000 mL (0 mLs Intravenous Stopped 06/22/19 2259)  ondansetron (ZOFRAN) injection 4 mg (4 mg Intravenous Given 06/22/19 2136)  HYDROmorphone (DILAUDID) injection 1 mg (1 mg Intramuscular Given 06/22/19 2113)  sodium chloride 0.9 % bolus 1,000 mL (1,000 mLs Intravenous New Bag/Given 06/22/19 2357)    ED Course  I have reviewed the triage vital signs and the nursing notes.  Pertinent labs & imaging results that were available during my care of the patient were reviewed by me and considered in my  medical decision making (see chart for details).    MDM Rules/Calculators/A&P                      CHERICE GLENNIE is a 54 y.o. female with a past medical history significant for diabetes with recent admission for DKA, CKD, hypertension, prior stroke, COPD, CHF, gastroparesis, and nonalcoholic fatty liver disease who presents with nausea, vomiting, abdominal pain, chest discomfort, and fatigue.  Patient reports that her symptoms feel consistent with DKA.  She reports even taking her medicines as directed but says that today, her symptoms returned.  She reported nonstop nausea and vomiting but no blood in her emesis.  She reports whenever she vomit she has chest pain that feels consistent with a chest discomfort with vomiting.  She denies it being constant and is only when she is vomiting.  She reports abdomen is  aching all over and feels the same as her recent DKA.  She denies new trauma.  She denies any urinary changes constipation or diarrhea.  No rectal bleeding reported.  She thinks that her legs may be slightly more swollen than her baseline but she is feeling very dry dehydrated with a dry mouth.  She is looking her lips all time.  She denies any significant back pain or flank pain.  On exam, patient is tachypneic and tachycardic.  Patient has dry mucous membranes and has very faint swelling in her legs.  Abdomen is tender diffusely and chest is tender to palpation reproducing her chest discomfort.  Her lungs were clear on my exam.  Clinically as per patient is again in DKA based on her similar presentation to prior.  We will give some fluids to start with but will also work-up for other abnormalities.  With her chest discomfort, I suspect it is similar to her musculoskeletal pain with all the nausea and vomiting similar to prior but we will get chest x-ray, EKG, troponin, and BNP given her history of CHF.  If she is back in DKA, anticipate readmission.  She will be given pain medicine nausea medicine and fluids per her previous episode.  She reports Dilaudid and Zofran has worked for symptomatic relief in the past.  Anticipate reassessment after work-up.  Work-up reveals early DKA.  Patient still having nausea and abdominal pain.  More fluids were given and DKA order set was started with insulin drip.  Patient admitted for further management.  Final Clinical Impression(s) / ED Diagnoses Final diagnoses:  Diabetic ketoacidosis without coma associated with type 2 diabetes mellitus (HCC)    Clinical Impression: 1. Diabetic ketoacidosis without coma associated with type 2 diabetes mellitus ([place])     Disposition: Admit  This note was prepared with assistance of Dragon voice recognition software. Occasional wrong-word or sound-a-like substitutions may have occurred due to the inherent limitations of  voice recognition software.       Adianna Darwin, Deborah Allegra, MD 06/23/19 [phone]

## 2019-06-22 NOTE — ED Triage Notes (Addendum)
Central chest pain and abdominal pain since this morning. Sts vomiting 6 times. DKA 2 weeks ago.

## 2019-06-22 NOTE — ED Notes (Signed)
Attempted IV x2, unsuccessful.  

## 2019-06-22 NOTE — ED Notes (Signed)
IV team at bedside attempting to get IV. Pt requesting IM pain medication. Dr. Sherry Ruffing made aware.

## 2019-06-22 NOTE — ED Notes (Signed)
Date and time results received: 06/21/2108:40 PM  Test: Lactic Acid  Critical Value: 4.6  Name of Provider Notified: Tegeler, MD  Orders Received? Or Actions Taken?:

## 2019-06-22 NOTE — ED Notes (Signed)
Pt had BM that mixed with urine while using BSC.

## 2019-06-23 DIAGNOSIS — I10 Essential (primary) hypertension: Secondary | ICD-10-CM | POA: Diagnosis not present

## 2019-06-23 DIAGNOSIS — E1143 Type 2 diabetes mellitus with diabetic autonomic (poly)neuropathy: Secondary | ICD-10-CM | POA: Diagnosis not present

## 2019-06-23 DIAGNOSIS — I5022 Chronic systolic (congestive) heart failure: Secondary | ICD-10-CM | POA: Diagnosis not present

## 2019-06-23 DIAGNOSIS — E1110 Type 2 diabetes mellitus with ketoacidosis without coma: Secondary | ICD-10-CM | POA: Diagnosis not present

## 2019-06-23 LAB — URINALYSIS, ROUTINE W REFLEX MICROSCOPIC
Bilirubin Urine: NEGATIVE
Glucose, UA: NEGATIVE mg/dL
Hgb urine dipstick: NEGATIVE
Ketones, ur: NEGATIVE mg/dL
Leukocytes,Ua: NEGATIVE
Nitrite: NEGATIVE
Protein, ur: >=300 mg/dL — AB
Specific Gravity, Urine: 1.012 (ref 1.005–1.030)
pH: 6 (ref 5.0–8.0)

## 2019-06-23 LAB — BASIC METABOLIC PANEL
Anion gap: 9 (ref 5–15)
Anion gap: 6 (ref 5–15)
BUN: 28 mg/dL — ABNORMAL HIGH (ref 6–20)
BUN: 28 mg/dL — ABNORMAL HIGH (ref 6–20)
CO2: 22 mmol/L (ref 22–32)
CO2: 24 mmol/L (ref 22–32)
Calcium: 8.4 mg/dL — ABNORMAL LOW (ref 8.9–10.3)
Calcium: 8.2 mg/dL — ABNORMAL LOW (ref 8.9–10.3)
Chloride: 109 mmol/L (ref 98–111)
Chloride: 112 mmol/L — ABNORMAL HIGH (ref 98–111)
Creatinine, Ser: 1.3 mg/dL — ABNORMAL HIGH (ref 0.44–1.00)
Creatinine, Ser: 1.26 mg/dL — ABNORMAL HIGH (ref 0.44–1.00)
GFR calc Af Amer: 53 mL/min — ABNORMAL LOW (ref >60)
GFR calc Af Amer: 56 mL/min — ABNORMAL LOW (ref >60)
GFR calc non Af Amer: 46 mL/min — ABNORMAL LOW (ref >60)
GFR calc non Af Amer: 48 mL/min — ABNORMAL LOW (ref >60)
Glucose, Bld: 201 mg/dL — ABNORMAL HIGH (ref 70–99)
Glucose, Bld: 176 mg/dL — ABNORMAL HIGH (ref 70–99)
Potassium: 3.2 mmol/L — ABNORMAL LOW (ref 3.5–5.1)
Potassium: 3.3 mmol/L — ABNORMAL LOW (ref 3.5–5.1)
Sodium: 140 mmol/L (ref 135–145)
Sodium: 142 mmol/L (ref 135–145)

## 2019-06-23 LAB — SARS CORONAVIRUS 2 (TAT 6-24 HRS): SARS Coronavirus 2: NEGATIVE

## 2019-06-23 LAB — BLOOD GAS, VENOUS
Acid-base deficit: 4.5 mmol/L — ABNORMAL HIGH (ref 0.0–2.0)
Bicarbonate: 20.9 mmol/L (ref 20.0–28.0)
O2 Saturation: 81 %
Patient temperature: 98.6
pCO2, Ven: 42.1 mmHg — ABNORMAL LOW (ref 44.0–60.0)
pH, Ven: 7.315 (ref 7.250–7.430)
pO2, Ven: 52.3 mmHg — ABNORMAL HIGH (ref 32.0–45.0)

## 2019-06-23 LAB — GLUCOSE, CAPILLARY
Glucose-Capillary: 266 mg/dL — ABNORMAL HIGH (ref 70–99)
Glucose-Capillary: 240 mg/dL — ABNORMAL HIGH (ref 70–99)

## 2019-06-23 LAB — CBG MONITORING, ED
Glucose-Capillary: 129 mg/dL — ABNORMAL HIGH (ref 70–99)
Glucose-Capillary: 147 mg/dL — ABNORMAL HIGH (ref 70–99)
Glucose-Capillary: 171 mg/dL — ABNORMAL HIGH (ref 70–99)
Glucose-Capillary: 195 mg/dL — ABNORMAL HIGH (ref 70–99)
Glucose-Capillary: 244 mg/dL — ABNORMAL HIGH (ref 70–99)

## 2019-06-23 LAB — LACTIC ACID, PLASMA: Lactic Acid, Venous: 1.8 mmol/L (ref 0.5–1.9)

## 2019-06-23 MED ORDER — INSULIN ASPART 100 UNIT/ML ~~LOC~~ SOLN
0.0000 [IU] | Freq: Three times a day (TID) | SUBCUTANEOUS | Status: DC
  Administered 2019-06-23: 8 [IU] via SUBCUTANEOUS
  Administered 2019-06-24 (×2): 3 [IU] via SUBCUTANEOUS
  Administered 2019-06-24 – 2019-06-25 (×2): 5 [IU] via SUBCUTANEOUS
  Administered 2019-06-25: 3 [IU] via SUBCUTANEOUS
  Administered 2019-06-25 – 2019-06-26 (×2): 5 [IU] via SUBCUTANEOUS
  Administered 2019-06-26: 3 [IU] via SUBCUTANEOUS
  Filled 2019-06-23: qty 0.15

## 2019-06-23 MED ORDER — POTASSIUM CHLORIDE CRYS ER 20 MEQ PO TBCR
40.0000 meq | EXTENDED_RELEASE_TABLET | Freq: Once | ORAL | Status: AC
  Administered 2019-06-23: 40 meq via ORAL
  Filled 2019-06-23: qty 2

## 2019-06-23 MED ORDER — POTASSIUM CHLORIDE CRYS ER 20 MEQ PO TBCR
40.0000 meq | EXTENDED_RELEASE_TABLET | Freq: Once | ORAL | Status: AC
  Administered 2019-06-23: 03:00:00 40 meq via ORAL
  Filled 2019-06-23: qty 2

## 2019-06-23 MED ORDER — ALUM & MAG HYDROXIDE-SIMETH 200-200-20 MG/5ML PO SUSP
30.0000 mL | Freq: Once | ORAL | Status: DC
  Filled 2019-06-23: qty 30

## 2019-06-23 MED ORDER — MORPHINE SULFATE (PF) 2 MG/ML IV SOLN
2.0000 mg | INTRAVENOUS | Status: DC | PRN
  Administered 2019-06-23 (×2): 4 mg via INTRAVENOUS
  Administered 2019-06-23 (×2): 2 mg via INTRAVENOUS
  Administered 2019-06-24 (×3): 4 mg via INTRAVENOUS
  Administered 2019-06-25 (×3): 2 mg via INTRAVENOUS
  Administered 2019-06-25: 4 mg via INTRAVENOUS
  Administered 2019-06-26 (×3): 2 mg via INTRAVENOUS
  Filled 2019-06-23: qty 1
  Filled 2019-06-23: qty 2
  Filled 2019-06-23 (×3): qty 1
  Filled 2019-06-23: qty 2
  Filled 2019-06-23: qty 1
  Filled 2019-06-23 (×3): qty 2
  Filled 2019-06-23: qty 1
  Filled 2019-06-23: qty 2
  Filled 2019-06-23 (×2): qty 1

## 2019-06-23 MED ORDER — LIDOCAINE VISCOUS HCL 2 % MT SOLN
15.0000 mL | Freq: Once | OROMUCOSAL | Status: DC
  Filled 2019-06-23: qty 15

## 2019-06-23 MED ORDER — INSULIN GLARGINE 100 UNIT/ML ~~LOC~~ SOLN
45.0000 [IU] | Freq: Every day | SUBCUTANEOUS | Status: DC
  Administered 2019-06-23 – 2019-06-26 (×4): 45 [IU] via SUBCUTANEOUS
  Filled 2019-06-23 (×4): qty 0.45

## 2019-06-23 MED ORDER — INSULIN GLARGINE 100 UNIT/ML ~~LOC~~ SOLN: 45.0000 [IU] | Freq: Every day | SUBCUTANEOUS | Status: DC

## 2019-06-23 MED ORDER — INSULIN ASPART 100 UNIT/ML ~~LOC~~ SOLN
0.0000 [IU] | Freq: Every day | SUBCUTANEOUS | Status: DC
  Administered 2019-06-23: 21:00:00 2 [IU] via SUBCUTANEOUS
  Administered 2019-06-24: 21:00:00 3 [IU] via SUBCUTANEOUS
  Administered 2019-06-25: 2 [IU] via SUBCUTANEOUS
  Filled 2019-06-23: qty 0.05

## 2019-06-23 NOTE — Progress Notes (Signed)
Dr Lonny Prude aware pt c/o abd pain as well as mid-chest pain. No distress. ED RN reported chest discomfort before pt come up. See new orders.

## 2019-06-23 NOTE — ED Notes (Signed)
BLOOD SUGAR 147

## 2019-06-23 NOTE — Progress Notes (Signed)
Inpatient Diabetes Program Recommendations  AACE/ADA: New Consensus Statement on Inpatient Glycemic Control (2015)  Target Ranges:  Prepandial:   less than 140 mg/dL      Peak postprandial:   less than 180 mg/dL (1-2 hours)      Critically ill patients:  140 - 180 mg/dL   Lab Results  Component Value Date   GLUCAP 147 (H) 06/23/2019   HGBA1C 8.3 (H) 05/07/2019    Review of Glycemic Control  Diabetes history: DM Outpatient Diabetes medications: Lantus 45 units + Novolog 30 units tid meal coverage Current orders for Inpatient glycemic control: Lantus 45 units + Novolog moderate tid + hs 0-5  Inpatient Diabetes Program Recommendations:   Noted patient was discharged 06/14/19 and DM coordinator spoke with patient on 06/13/19. Consider if eating: -Add Novolog 2 units tid meal coverage if eats 50% -Decrease Novolog correction to sensitive tid + hs  Thank you, Nani Gasser. Makyia Erxleben, RN, MSN, CDE  Diabetes Coordinator Inpatient Glycemic Control Team Team Pager [phone] (8am-5pm) 06/23/2019 12:46 PM

## 2019-06-23 NOTE — Progress Notes (Signed)
PROGRESS NOTE    Deborah [name]  RWE:[id number] DOB: December 04, 1965 DOA: 06/22/2019 PCP: [name], DO   Brief Narrative: Deborah [name] is a 54 y.o. female with a history of heart failure, diabetes mellitus, gastroparesis, paroxysmal atrial fibrillation. Patient presented secondary to nausea/vomiting/abdominal pain in setting of DKA. Patient started on an insulin drip on admission and DKA now resolved.   Assessment & Plan:   Principal Problem:   DKA (diabetic ketoacidoses) ([place]) Active Problems:   Essential hypertension, benign   Uncontrolled type 2 diabetes mellitus with diabetic neuropathy, with long-term current use of insulin (HCC)   Diabetic gastroparesis (HCC)   Chronic systolic CHF (congestive heart failure) ([place])   DKA Unknown etiology although patient states she has been feeling a little "sick." Made worse by nausea/vomiting. Patient started on an insulin drip on admission with quick resolution of acidosis and anion gap. -Transition to Lantus 45 units daily; transition off drip 2 hours after Lantus -SSI qAC and HS -If comes off of drip successfully, transfer patient to medical foor rather than stepdown  Diabetes mellitus, type 2 Patient is on Lantus 45 units daily. Hemoglobin A1C of 8.3%. -Insulin as mentioned above  Essential hypertension -Continue amlodipine  Diabetic gastroparesis Patient is on Reglan as an outpatient. Possibly contributed to DKA episode. -Continue Reglan  Chronic systolic heart failure EF of 40-45%. Stable.  Paroxysmal atrial fibrillation -Continue Eliquis  Seasonal allergies -Continue Claritin  GERD -Continue Protonix  Hyperlipidemia -Continue Lipitor    DVT prophylaxis: Eliquis Code Status:   Code Status: Full Code Family Communication: None at bedside Disposition Plan: Discharge tomorrow if blood sugar stable and pending PT/OT   Consultants:   None  Procedures:   Insulin  drip  Antimicrobials:  None    Subjective: No issues this morning. No nausea/vomiting.  Objective: Vitals:   06/23/19 1030 06/23/19 1100 06/23/19 1115 06/23/19 1130  BP: 129/81 137/86  108/64  Pulse: 99 (!) 101 (!) 103 100  Resp: (!) 21 (!) 21 20 12   Temp:      TempSrc:      SpO2: 95% 94% 98% 95%  Weight:      Height:        Intake/Output Summary (Last 24 hours) at 06/23/2019 1153 Last data filed at 06/23/2019 0143 Gross per 24 hour  Intake 2000 ml  Output --  Net 2000 ml   Filed Weights   06/22/19 1906 06/23/19 0855  Weight: (!) 152 kg (!) 152 kg    Examination:  General exam: Appears calm and comfortable Respiratory system: Clear to auscultation. Respiratory effort normal. Cardiovascular system: S1 & S2 heard, RRR. No murmurs, rubs, gallops or clicks. Gastrointestinal system: Abdomen is nondistended, soft and nontender. No organomegaly or masses felt. Normal bowel sounds heard. Central nervous system: Alert and oriented. No focal neurological deficits. Extremities: No edema. No calf tenderness Skin: No cyanosis. No rashes Psychiatry: Judgement and insight appear normal. Mood & affect appropriate.     Data Reviewed: I have personally reviewed following labs and imaging studies  CBC: Recent Labs  Lab 06/22/19 2123  WBC 14.5*  HGB 10.7*  HCT 34.0*  MCV 85.9  PLT 619   Basic Metabolic Panel: Recent Labs  Lab 06/22/19 2123 06/22/19 2321 06/23/19 0145 06/23/19 0309  NA 136 138 140 142  K 3.2* 3.3* 3.2* 3.3*  CL 104 106 109 112*  CO2 16* 21* 22 24  GLUCOSE 306* 276* 201* 176*  BUN 27* 28* 28* 28*  CREATININE 1.38* 1.36*  1.30* 1.26*  CALCIUM 8.8* 8.4* 8.4* 8.2*   GFR: Estimated Creatinine Clearance: 76.8 mL/min (A) (by C-G formula based on SCr of 1.26 mg/dL (H)). Liver Function Tests: Recent Labs  Lab 06/22/19 2123  AST 21  ALT 19  ALKPHOS 101  BILITOT 0.9  PROT 7.6  ALBUMIN 3.4*   Recent Labs  Lab 06/22/19 2123  LIPASE 22   No  results for input(s): AMMONIA in the last 168 hours. Coagulation Profile: No results for input(s): INR, PROTIME in the last 168 hours. Cardiac Enzymes: No results for input(s): CKTOTAL, CKMB, CKMBINDEX, TROPONINI in the last 168 hours. BNP (last 3 results) No results for input(s): PROBNP in the last 8760 hours. HbA1C: No results for input(s): HGBA1C in the last 72 hours. CBG: Recent Labs  Lab 06/23/19 0034 06/23/19 0140 06/23/19 0231 06/23/19 0606 06/23/19 0734  GLUCAP 244* 195* 171* 129* 147*   Lipid Profile: No results for input(s): CHOL, HDL, LDLCALC, TRIG, CHOLHDL, LDLDIRECT in the last 72 hours. Thyroid Function Tests: No results for input(s): TSH, T4TOTAL, FREET4, T3FREE, THYROIDAB in the last 72 hours. Anemia Panel: No results for input(s): VITAMINB12, FOLATE, FERRITIN, TIBC, IRON, RETICCTPCT in the last 72 hours. Sepsis Labs: Recent Labs  Lab 06/22/19 2123 06/22/19 2321 06/23/19 0807  LATICACIDVEN 4.6* 3.5* 1.8    Recent Results (from the past 240 hour(s))  SARS CORONAVIRUS 2 (TAT 6-24 HRS) Nasopharyngeal Nasopharyngeal Swab     Status: None   Collection Time: 06/23/19 12:46 AM   Specimen: Nasopharyngeal Swab  Result Value Ref Range Status   SARS Coronavirus 2 NEGATIVE NEGATIVE Final    Comment: (NOTE) SARS-CoV-2 target nucleic acids are NOT DETECTED. The SARS-CoV-2 RNA is generally detectable in upper and lower respiratory specimens during the acute phase of infection. Negative results do not preclude SARS-CoV-2 infection, do not rule out co-infections with other pathogens, and should not be used as the sole basis for treatment or other patient management decisions. Negative results must be combined with clinical observations, patient history, and epidemiological information. The expected result is Negative. Fact Sheet for Patients: SugarRoll.be Fact Sheet for Healthcare  Providers: https://www.woods-mathews.com/ This test is not yet approved or cleared by the [place] FDA and  has been authorized for detection and/or diagnosis of SARS-CoV-2 by FDA under an Emergency Use Authorization (EUA). This EUA will remain  in effect (meaning this test can be used) for the duration of the COVID-19 declaration under Section 56 4(b)(1) of the Act, 21 U.S.C. section 360bbb-3(b)(1), unless the authorization is terminated or revoked sooner. Performed at [facility], [place] [address], [place], [place] [address]          Radiology Studies: DG Chest 2 View  Result Date: 06/22/2019 CLINICAL DATA:  Vomiting, central chest and abdominal pain, history of diabetic ketoacidosis EXAM: CHEST - 2 VIEW COMPARISON:  06/10/2019 FINDINGS: The heart size and mediastinal contours are within normal limits. Both lungs are clear. The visualized skeletal structures are unremarkable. IMPRESSION: No active cardiopulmonary disease. Electronically Signed   By: Randa Ngo M.D.   On: 06/22/2019 19:24        Scheduled Meds: . amLODipine  10 mg Oral q morning - 10a  . apixaban  5 mg Oral BID  . aspirin EC  81 mg Oral Daily  . atorvastatin  10 mg Oral q1800  . DULoxetine  40 mg Oral BID  . hydrALAZINE  25 mg Oral TID  . insulin glargine  45 Units Subcutaneous Daily  .  loratadine  10 mg Oral Daily  . melatonin  6 mg Oral QHS  . metoCLOPramide  10 mg Oral TID AC  . metoprolol succinate  25 mg Oral Daily  . pantoprazole  40 mg Oral BID  . potassium chloride  40 mEq Oral Once   Continuous Infusions: . sodium chloride    . dextrose 5 % and 0.45% NaCl 75 mL/hr at 06/23/19 0045  . insulin 3 Units/hr (06/23/19 0754)     LOS: 1 day     Cordelia Poche, MD [facility] 06/23/2019, 11:53 AM  If 7PM-7AM, please contact night-coverage [email or web address]

## 2019-06-23 NOTE — ED Notes (Signed)
Pt eating lunch

## 2019-06-24 DIAGNOSIS — I5022 Chronic systolic (congestive) heart failure: Secondary | ICD-10-CM | POA: Diagnosis not present

## 2019-06-24 DIAGNOSIS — E1143 Type 2 diabetes mellitus with diabetic autonomic (poly)neuropathy: Secondary | ICD-10-CM | POA: Diagnosis not present

## 2019-06-24 DIAGNOSIS — E1110 Type 2 diabetes mellitus with ketoacidosis without coma: Secondary | ICD-10-CM | POA: Diagnosis not present

## 2019-06-24 DIAGNOSIS — I10 Essential (primary) hypertension: Secondary | ICD-10-CM | POA: Diagnosis not present

## 2019-06-24 LAB — URINE CULTURE

## 2019-06-24 LAB — BASIC METABOLIC PANEL
Anion gap: 8 (ref 5–15)
Anion gap: 11 (ref 5–15)
BUN: 33 mg/dL — ABNORMAL HIGH (ref 6–20)
BUN: 34 mg/dL — ABNORMAL HIGH (ref 6–20)
CO2: 22 mmol/L (ref 22–32)
CO2: 21 mmol/L — ABNORMAL LOW (ref 22–32)
Calcium: 8.2 mg/dL — ABNORMAL LOW (ref 8.9–10.3)
Calcium: 8.2 mg/dL — ABNORMAL LOW (ref 8.9–10.3)
Chloride: 106 mmol/L (ref 98–111)
Chloride: 104 mmol/L (ref 98–111)
Creatinine, Ser: 1.6 mg/dL — ABNORMAL HIGH (ref 0.44–1.00)
Creatinine, Ser: 1.83 mg/dL — ABNORMAL HIGH (ref 0.44–1.00)
GFR calc Af Amer: 42 mL/min — ABNORMAL LOW (ref >60)
GFR calc Af Amer: 35 mL/min — ABNORMAL LOW (ref >60)
GFR calc non Af Amer: 36 mL/min — ABNORMAL LOW (ref >60)
GFR calc non Af Amer: 31 mL/min — ABNORMAL LOW (ref >60)
Glucose, Bld: 218 mg/dL — ABNORMAL HIGH (ref 70–99)
Glucose, Bld: 316 mg/dL — ABNORMAL HIGH (ref 70–99)
Potassium: 3.6 mmol/L (ref 3.5–5.1)
Potassium: 3.9 mmol/L (ref 3.5–5.1)
Sodium: 136 mmol/L (ref 135–145)
Sodium: 136 mmol/L (ref 135–145)

## 2019-06-24 LAB — GLUCOSE, CAPILLARY
Glucose-Capillary: 197 mg/dL — ABNORMAL HIGH (ref 70–99)
Glucose-Capillary: 207 mg/dL — ABNORMAL HIGH (ref 70–99)
Glucose-Capillary: 175 mg/dL — ABNORMAL HIGH (ref 70–99)
Glucose-Capillary: 262 mg/dL — ABNORMAL HIGH (ref 70–99)

## 2019-06-24 MED ORDER — CALCIUM CARBONATE ANTACID 500 MG PO CHEW: 1.0000 | CHEWABLE_TABLET | Freq: Three times a day (TID) | ORAL | Status: DC | PRN

## 2019-06-24 MED ORDER — SODIUM CHLORIDE 0.9 % IV SOLN: INTRAVENOUS | Status: AC

## 2019-06-24 NOTE — Progress Notes (Signed)
PROGRESS NOTE    Deborah [name]  [id number]:[id number] DOB: 1965/07/19 DOA: 06/22/2019 PCP: [name], DO   Brief Narrative: Deborah [name] is a 54 y.o. female with a history of heart failure, diabetes mellitus, gastroparesis, paroxysmal atrial fibrillation. Patient presented secondary to nausea/vomiting/abdominal pain in setting of DKA. Patient started on an insulin drip on admission and DKA now resolved.   Assessment & Plan:   Principal Problem:   DKA (diabetic ketoacidoses) ([place]) Active Problems:   Essential hypertension, benign   Uncontrolled type 2 diabetes mellitus with diabetic neuropathy, with long-term current use of insulin (HCC)   Diabetic gastroparesis (HCC)   Chronic systolic CHF (congestive heart failure) ([place])   DKA Unknown etiology although patient states she has been feeling a little "sick." Made worse by nausea/vomiting. Patient started on an insulin drip on admission with quick resolution of acidosis and anion gap. -Continue Lantus 45 units daily -SSI qAC and HS  Diabetes mellitus, type 2 Patient is on Lantus 45 units daily. Hemoglobin A1C of 8.3%. -Insulin as mentioned above  Essential hypertension -Continue amlodipine  Diabetic gastroparesis Patient is on Reglan as an outpatient. Possibly contributed to DKA episode. -Continue Reglan  Chronic systolic heart failure EF of 40-45%. Stable.  Paroxysmal atrial fibrillation -Continue Eliquis  Seasonal allergies -Continue Claritin  GERD -Continue Protonix  Hyperlipidemia -Continue Lipitor  CKD stage IIIb Baseline creatinine of 1.3. Worsened overnight. Concern possibly secondary to dehydration from continued vomiting. -Normal saline -BMP in AM   DVT prophylaxis: Eliquis Code Status:   Code Status: Full Code Family Communication: None at bedside Disposition Plan: Discharge tomorrow if blood sugar stable, improvement of creatinine and pending PT/OT   Consultants:    None  Procedures:   Insulin drip  Antimicrobials:  None    Subjective: Vomiting overnight.  Objective: Vitals:   06/23/19 1322 06/23/19 1707 06/23/19 2046 06/24/19 0450  BP: (!) 133/92 123/76 124/84 120/75  Pulse: 99 (!) 102 92 89  Resp: 18  18 16   Temp: (!) 97.5 F (36.4 C) 98.2 F (36.8 C) (!) 97.4 F (36.3 C) (!) 97.4 F (36.3 C)  TempSrc: Axillary Oral Oral Oral  SpO2: 100% 99% 100% 98%  Weight:      Height:        Intake/Output Summary (Last 24 hours) at 06/24/2019 1255 Last data filed at 06/24/2019 0944 Gross per 24 hour  Intake 48.66 ml  Output 400 ml  Net -351.34 ml   Filed Weights   06/22/19 1906 06/23/19 0855  Weight: (!) 152 kg (!) 152 kg    Examination:  General exam: Appears calm and comfortable Respiratory system: Clear to auscultation. Respiratory effort normal. Cardiovascular system: S1 & S2 heard, RRR. No murmurs, rubs, gallops or clicks. Gastrointestinal system: Abdomen is nondistended, soft and nontender. No organomegaly or masses felt. Normal bowel sounds heard. Central nervous system: Alert and oriented. No focal neurological deficits. Extremities: LE edema. No calf tenderness Skin: No cyanosis. No rashes Psychiatry: Judgement and insight appear normal. Mood & affect appropriate.     Data Reviewed: I have personally reviewed following labs and imaging studies  CBC: Recent Labs  Lab 06/22/19 2123  WBC 14.5*  HGB 10.7*  HCT 34.0*  MCV 85.9  PLT 196   Basic Metabolic Panel: Recent Labs  Lab 06/22/19 2123 06/22/19 2321 06/23/19 0145 06/23/19 0309 06/24/19 0916  NA 136 138 140 142 136  K 3.2* 3.3* 3.2* 3.3* 3.6  CL 104 106 109 112* 106  CO2 16* 21*  22 24 22   GLUCOSE 306* 276* 201* 176* 218*  BUN 27* 28* 28* 28* 33*  CREATININE 1.38* 1.36* 1.30* 1.26* 1.60*  CALCIUM 8.8* 8.4* 8.4* 8.2* 8.2*   GFR: Estimated Creatinine Clearance: 60.5 mL/min (A) (by C-G formula based on SCr of 1.6 mg/dL (H)). Liver Function  Tests: Recent Labs  Lab 06/22/19 2123  AST 21  ALT 19  ALKPHOS 101  BILITOT 0.9  PROT 7.6  ALBUMIN 3.4*   Recent Labs  Lab 06/22/19 2123  LIPASE 22   No results for input(s): AMMONIA in the last 168 hours. Coagulation Profile: No results for input(s): INR, PROTIME in the last 168 hours. Cardiac Enzymes: No results for input(s): CKTOTAL, CKMB, CKMBINDEX, TROPONINI in the last 168 hours. BNP (last 3 results) No results for input(s): PROBNP in the last 8760 hours. HbA1C: No results for input(s): HGBA1C in the last 72 hours. CBG: Recent Labs  Lab 06/23/19 0734 06/23/19 1615 06/23/19 2044 06/24/19 0723 06/24/19 1215  GLUCAP 147* 266* 240* 197* 207*   Lipid Profile: No results for input(s): CHOL, HDL, LDLCALC, TRIG, CHOLHDL, LDLDIRECT in the last 72 hours. Thyroid Function Tests: No results for input(s): TSH, T4TOTAL, FREET4, T3FREE, THYROIDAB in the last 72 hours. Anemia Panel: No results for input(s): VITAMINB12, FOLATE, FERRITIN, TIBC, IRON, RETICCTPCT in the last 72 hours. Sepsis Labs: Recent Labs  Lab 06/22/19 2123 06/22/19 2321 06/23/19 0807  LATICACIDVEN 4.6* 3.5* 1.8    Recent Results (from the past 240 hour(s))  Urine culture     Status: Abnormal   Collection Time: 06/22/19  7:52 PM   Specimen: Urine, Clean Catch  Result Value Ref Range Status   Specimen Description   Final    URINE, CLEAN CATCH Performed at [facility], [place] [address], [place], [place] [address]    Special Requests   Final    NONE Performed at [facility], [place] [address], [place], [place] [address]    Culture MULTIPLE SPECIES PRESENT, SUGGEST RECOLLECTION (A)  Final   Report Status 06/24/2019 FINAL  Final  SARS CORONAVIRUS 2 (TAT 6-24 HRS) Nasopharyngeal Nasopharyngeal Swab     Status: None   Collection Time: 06/23/19 12:46 AM   Specimen: Nasopharyngeal Swab  Result Value Ref Range Status   SARS Coronavirus 2 NEGATIVE NEGATIVE Final     Comment: (NOTE) SARS-CoV-2 target nucleic acids are NOT DETECTED. The SARS-CoV-2 RNA is generally detectable in upper and lower respiratory specimens during the acute phase of infection. Negative results do not preclude SARS-CoV-2 infection, do not rule out co-infections with other pathogens, and should not be used as the sole basis for treatment or other patient management decisions. Negative results must be combined with clinical observations, patient history, and epidemiological information. The expected result is Negative. Fact Sheet for Patients: SugarRoll.be Fact Sheet for Healthcare Providers: https://www.woods-mathews.com/ This test is not yet approved or cleared by the [place] FDA and  has been authorized for detection and/or diagnosis of SARS-CoV-2 by FDA under an Emergency Use Authorization (EUA). This EUA will remain  in effect (meaning this test can be used) for the duration of the COVID-19 declaration under Section 56 4(b)(1) of the Act, 21 U.S.C. section 360bbb-3(b)(1), unless the authorization is terminated or revoked sooner. Performed at [facility], [place] [address], [place], [place] [address]          Radiology Studies: DG Chest 2 View  Result Date: 06/22/2019 CLINICAL DATA:  Vomiting, central chest and abdominal  pain, history of diabetic ketoacidosis EXAM: CHEST - 2 VIEW COMPARISON:  06/10/2019 FINDINGS: The heart size and mediastinal contours are within normal limits. Both lungs are clear. The visualized skeletal structures are unremarkable. IMPRESSION: No active cardiopulmonary disease. Electronically Signed   By: Randa Ngo M.D.   On: 06/22/2019 19:24        Scheduled Meds: . alum & mag hydroxide-simeth  30 mL Oral Once   And  . lidocaine  15 mL Oral Once  . amLODipine  10 mg Oral q morning - 10a  . apixaban  5 mg Oral BID  . aspirin EC  81 mg Oral Daily  . atorvastatin  10 mg Oral q1800  .  DULoxetine  40 mg Oral BID  . hydrALAZINE  25 mg Oral TID  . insulin aspart  0-15 Units Subcutaneous TID WC  . insulin aspart  0-5 Units Subcutaneous QHS  . insulin glargine  45 Units Subcutaneous Daily  . loratadine  10 mg Oral Daily  . melatonin  6 mg Oral QHS  . metoCLOPramide  10 mg Oral TID AC  . metoprolol succinate  25 mg Oral Daily  . pantoprazole  40 mg Oral BID   Continuous Infusions: . sodium chloride       LOS: 2 days     Cordelia Poche, MD [facility] 06/24/2019, 12:55 PM  If 7PM-7AM, please contact night-coverage [email or web address]

## 2019-06-25 DIAGNOSIS — I5022 Chronic systolic (congestive) heart failure: Secondary | ICD-10-CM | POA: Diagnosis not present

## 2019-06-25 DIAGNOSIS — E1110 Type 2 diabetes mellitus with ketoacidosis without coma: Secondary | ICD-10-CM | POA: Diagnosis not present

## 2019-06-25 DIAGNOSIS — I10 Essential (primary) hypertension: Secondary | ICD-10-CM | POA: Diagnosis not present

## 2019-06-25 DIAGNOSIS — E1143 Type 2 diabetes mellitus with diabetic autonomic (poly)neuropathy: Secondary | ICD-10-CM | POA: Diagnosis not present

## 2019-06-25 LAB — BASIC METABOLIC PANEL
Anion gap: 8 (ref 5–15)
BUN: 39 mg/dL — ABNORMAL HIGH (ref 6–20)
CO2: 21 mmol/L — ABNORMAL LOW (ref 22–32)
Calcium: 8.3 mg/dL — ABNORMAL LOW (ref 8.9–10.3)
Chloride: 106 mmol/L (ref 98–111)
Creatinine, Ser: 1.63 mg/dL — ABNORMAL HIGH (ref 0.44–1.00)
GFR calc Af Amer: 41 mL/min — ABNORMAL LOW (ref >60)
GFR calc non Af Amer: 35 mL/min — ABNORMAL LOW (ref >60)
Glucose, Bld: 218 mg/dL — ABNORMAL HIGH (ref 70–99)
Potassium: 3.6 mmol/L (ref 3.5–5.1)
Sodium: 135 mmol/L (ref 135–145)

## 2019-06-25 LAB — GLUCOSE, CAPILLARY
Glucose-Capillary: 189 mg/dL — ABNORMAL HIGH (ref 70–99)
Glucose-Capillary: 239 mg/dL — ABNORMAL HIGH (ref 70–99)
Glucose-Capillary: 247 mg/dL — ABNORMAL HIGH (ref 70–99)
Glucose-Capillary: 245 mg/dL — ABNORMAL HIGH (ref 70–99)

## 2019-06-25 MED ORDER — SODIUM CHLORIDE 0.9 % IV SOLN: INTRAVENOUS | Status: AC

## 2019-06-25 MED ORDER — METOCLOPRAMIDE HCL 5 MG/ML IJ SOLN
10.0000 mg | Freq: Three times a day (TID) | INTRAMUSCULAR | Status: DC
  Administered 2019-06-25 – 2019-06-26 (×2): 10 mg via INTRAVENOUS
  Filled 2019-06-25 (×3): qty 2

## 2019-06-25 MED ORDER — INSULIN ASPART 100 UNIT/ML ~~LOC~~ SOLN
5.0000 [IU] | Freq: Three times a day (TID) | SUBCUTANEOUS | Status: DC
  Administered 2019-06-25 – 2019-06-26 (×3): 5 [IU] via SUBCUTANEOUS

## 2019-06-25 NOTE — TOC Initial Note (Signed)
Transition of Care [facility]) - Initial/Assessment Note    Patient Details  Name: Deborah [name] MRN: [id number] Date of Birth: Aug 24, 1965  [facility] CM/SW Contact:    Lennart Pall, LCSW Phone Number: 06/25/2019, 3:17 PM  Clinical Narrative:      Met with pt to review for potential d/c needs.  Pt reports that she lives with her mother and has good support.  She denies any needs for assistance at home, however, awaiting therapy evals.  She hopes to d/c by tomorrow.  She confirms that she is followed by Case Manager based at [facility] for community resources and support.  No needs identified at this time.  Will monitor.             Expected Discharge Plan: Home/Self Care Barriers to Discharge: Continued Medical Work up   Patient Goals and CMS Choice Patient states their goals for this hospitalization and ongoing recovery are:: go home      Expected Discharge Plan and Services Expected Discharge Plan: Home/Self Care In-house Referral: Clinical Social Work     Living arrangements for the past 2 months: Single Family Home                                      Prior Living Arrangements/Services Living arrangements for the past 2 months: Single Family Home Lives with:: Parents Patient language and need for interpreter reviewed:: Yes Do you feel safe going back to the place where you live?: Yes      Need for Family Participation in Patient Care: No (Comment) Care giver support system in place?: Yes (comment)   Criminal Activity/Legal Involvement Pertinent to Current Situation/Hospitalization: No - Comment as needed  Activities of Daily Living Home Assistive Devices/Equipment: CBG Meter, Wheelchair ADL Screening (condition at time of admission) Patient's cognitive ability adequate to safely complete daily activities?: Yes Is the patient deaf or have difficulty hearing?: No Does the patient have difficulty seeing, even when wearing  glasses/contacts?: No Does the patient have difficulty concentrating, remembering, or making decisions?: No Patient able to express need for assistance with ADLs?: Yes Does the patient have difficulty dressing or bathing?: No Independently performs ADLs?: Yes (appropriate for developmental age) Does the patient have difficulty walking or climbing stairs?: No Weakness of Legs: None Weakness of Arms/Hands: None  Permission Sought/Granted Permission sought to share information with : Family Supports    Share Information with NAME: Deborah [name]     Permission granted to share info w Relationship: mother  Permission granted to share info w Contact Information: [phone]  Emotional Assessment Appearance:: Appears stated age Attitude/Demeanor/Rapport: Guarded Affect (typically observed): Guarded, Flat Orientation: : Oriented to Self, Oriented to Place, Oriented to  Time, Oriented to Situation Alcohol / Substance Use: Not Applicable Psych Involvement: No (comment)  Admission diagnosis:  DKA (diabetic ketoacidoses) ([place]) [E11.10] Diabetic ketoacidosis without coma associated with type 2 diabetes mellitus ([place]) [E11.10] Patient Active Problem List   Diagnosis Date Noted  . Nonalcoholic fatty liver disease 06/05/2019  . Chronic diarrhea   . COPD exacerbation ([place]) 05/08/2019  . Acute on chronic HFrEF (heart failure with reduced ejection fraction) ([place])   . High risk social situation   . Restrictive lung disease secondary to obesity   . Physical deconditioning   . [name] 05/07/2019  . History of gastric ulcer   . Uncontrolled type 2 diabetes mellitus with  hyperglycemia ([place])   . Fibroid uterus 02/23/2019  . Congestion of nasal sinus 01/24/2019  . Chronic systolic CHF (congestive heart failure) ([place]) 12/19/2018  . Intractable nausea and vomiting 12/17/2018  . Hypoxia 12/17/2018  . Hyperglycemia 12/17/2018  . Elevated troponin I level   . Vomiting 07/18/2018  .  Abdominal pain 07/17/2018  . Hyperkalemia 07/17/2018  . Cardiac arrest ([place]) 11/29/2017  . Pelvic mass 11/29/2017  . Leukocytosis 11/29/2017  . Anxiety 11/29/2017  . Allergic reaction to contrast media, initial encounter 11/28/2017  . Palliative care encounter   . Back pain 03/19/2017  . Stroke (cerebrum) ([place]) 03/19/2017  . GERD (gastroesophageal reflux disease) 03/19/2017  . Depression 03/19/2017  . Morbid obesity ([place])   . Urinary tract infection 08/16/2016  . Normocytic normochromic anemia 08/16/2016  . Gastroparesis 08/16/2016  . Intractable vomiting with nausea 06/17/2016  . Diabetic gastroparesis ([place]) 06/05/2016  . Gout 06/05/2016  . AKI (acute kidney injury) ([place]) 12/06/2015  . Chest pain 09/26/2015  . Hypokalemia 09/26/2015  . Hypomagnesemia 09/26/2015  . Nausea and vomiting 08/20/2015  . Gout flare 05/27/2015  . Lower abdominal pain 05/26/2015  . DKA (diabetic ketoacidoses) ([place]) 05/25/2015  . Uncontrolled type 2 diabetes mellitus with diabetic neuropathy, with long-term current use of insulin ([place]) 05/25/2015  . Type 2 diabetes mellitus with hyperglycemia, with long-term current use of insulin ([place]) 05/25/2015  . CKD (chronic kidney disease), stage II 05/25/2015  . Essential hypertension, benign 09/28/2013   PCP:  [name], DO Pharmacy:   [place], [place] - [address] [place] [address] Phone: [phone] Fax: [phone]     Social Determinants of Health (SDOH) Interventions    Readmission Risk Interventions Readmission Risk Prevention Plan 06/25/2019 01/29/2019 12/19/2018  Transportation Screening Complete Complete Complete  PCP or Specialist Appt within 3-5 Days - Complete Not Complete  Not Complete comments - - Left a message asking for appointment within 5-7 days  [place] or [place] - Complete Not Complete  Social Work Consult for [place] Planning/Counseling - Complete  Not Complete  Palliative Care Screening - Not Applicable Not Applicable  Medication Review (RN Care Manager) Complete Complete Referral to Pharmacy  PCP or Specialist appointment within 3-5 days of discharge Complete - -  SW Recovery Care/Counseling Consult Complete - -  Palliative Care Screening Not Applicable - -  [place] Not Applicable - -  Some recent data might be hidden

## 2019-06-25 NOTE — Progress Notes (Signed)
PROGRESS NOTE    Deborah [name]  IOE:[id number] DOB: 1966-01-08 DOA: 06/22/2019 PCP: [name], DO   Brief Narrative: Deborah [name] is a 54 y.o. female with a history of heart failure, diabetes mellitus, gastroparesis, paroxysmal atrial fibrillation. Patient presented secondary to nausea/vomiting/abdominal pain in setting of DKA. Patient started on an insulin drip on admission and DKA now resolved.   Assessment & Plan:   Principal Problem:   DKA (diabetic ketoacidoses) ([place]) Active Problems:   Essential hypertension, benign   Uncontrolled type 2 diabetes mellitus with diabetic neuropathy, with long-term current use of insulin (HCC)   Diabetic gastroparesis ([facility])   Chronic systolic CHF (congestive heart failure) ([place])   DKA Unknown etiology although patient states she has been feeling a little "sick." Made worse by nausea/vomiting. Patient started on an insulin drip on admission with quick resolution of acidosis and anion gap. -Continue Lantus 45 units daily -SSI qAC and HS -Add Novolog 5 units TID  Diabetes mellitus, type 2 Patient is on Lantus 45 units daily. Hemoglobin A1C of 8.3%. -Insulin as mentioned above  Essential hypertension -Continue amlodipine  Diabetic gastroparesis Patient is on Reglan as an outpatient. Possibly contributed to DKA episode. -Continue Reglan  Chronic systolic heart failure EF of 40-45%. Stable.  Paroxysmal atrial fibrillation -Continue Eliquis  Seasonal allergies -Continue Claritin  GERD -Continue Protonix  Hyperlipidemia -Continue Lipitor  AKI on CKD stage IIIb Baseline creatinine of 1.3. Worsened overnight. Concern possibly secondary to dehydration from continued vomiting. Peak creatinine of 1.83. Starting to improve with IV fluids. -Normal saline -BMP in AM   DVT prophylaxis: Eliquis Code Status:   Code Status: Full Code Family Communication: None at bedside Disposition Plan: Discharge pending  improvement of emesis and AKI   Consultants:   None  Procedures:   Insulin drip  Antimicrobials:  None    Subjective: Continued emesis overnight.  Objective: Vitals:   06/24/19 1406 06/24/19 2120 06/25/19 0602 06/25/19 1309  BP: 127/79 123/75 100/78 126/76  Pulse: 87 93 91 83  Resp: 16 18 16 18   Temp: (!) 97.5 F (36.4 C) 97.9 F (36.6 C) 98 F (36.7 C) (!) 97.4 F (36.3 C)  TempSrc: Oral Oral Oral Oral  SpO2: 100% 100% 97% 100%  Weight:      Height:        Intake/Output Summary (Last 24 hours) at 06/25/2019 1331 Last data filed at 06/25/2019 1310 Gross per 24 hour  Intake 1293.03 ml  Output 1200 ml  Net 93.03 ml   Filed Weights   06/22/19 1906 06/23/19 0855  Weight: (!) 152 kg (!) 152 kg    Examination:  General exam: Appears calm and comfortable Respiratory system: Clear to auscultation. Respiratory effort normal. Cardiovascular system: S1 & S2 heard, RRR. No murmurs, rubs, gallops or clicks. Gastrointestinal system: Abdomen is nondistended, soft and nontender. No organomegaly or masses felt. Normal bowel sounds heard. Central nervous system: Alert and oriented. No focal neurological deficits. Extremities: No edema. No calf tenderness Skin: No cyanosis. No rashes Psychiatry: Judgement and insight appear normal. Mood & affect appropriate.     Data Reviewed: I have personally reviewed following labs and imaging studies  CBC: Recent Labs  Lab 06/22/19 2123  WBC 14.5*  HGB 10.7*  HCT 34.0*  MCV 85.9  PLT 182   Basic Metabolic Panel: Recent Labs  Lab 06/23/19 0145 06/23/19 0309 06/24/19 0916 06/24/19 1600 06/25/19 0136  NA 140 142 136 136 135  K 3.2* 3.3* 3.6 3.9 3.6  CL 109 112* 106 104 106  CO2 22 24 22  21* 21*  GLUCOSE 201* 176* 218* 316* 218*  BUN 28* 28* 33* 34* 39*  CREATININE 1.30* 1.26* 1.60* 1.83* 1.63*  CALCIUM 8.4* 8.2* 8.2* 8.2* 8.3*   GFR: Estimated Creatinine Clearance: 59.3 mL/min (A) (by C-G formula based on SCr of  1.63 mg/dL (H)). Liver Function Tests: Recent Labs  Lab 06/22/19 2123  AST 21  ALT 19  ALKPHOS 101  BILITOT 0.9  PROT 7.6  ALBUMIN 3.4*   Recent Labs  Lab 06/22/19 2123  LIPASE 22   No results for input(s): AMMONIA in the last 168 hours. Coagulation Profile: No results for input(s): INR, PROTIME in the last 168 hours. Cardiac Enzymes: No results for input(s): CKTOTAL, CKMB, CKMBINDEX, TROPONINI in the last 168 hours. BNP (last 3 results) No results for input(s): PROBNP in the last 8760 hours. HbA1C: No results for input(s): HGBA1C in the last 72 hours. CBG: Recent Labs  Lab 06/24/19 1215 06/24/19 1637 06/24/19 2115 06/25/19 0750 06/25/19 1150  GLUCAP 207* 175* 262* 189* 239*   Lipid Profile: No results for input(s): CHOL, HDL, LDLCALC, TRIG, CHOLHDL, LDLDIRECT in the last 72 hours. Thyroid Function Tests: No results for input(s): TSH, T4TOTAL, FREET4, T3FREE, THYROIDAB in the last 72 hours. Anemia Panel: No results for input(s): VITAMINB12, FOLATE, FERRITIN, TIBC, IRON, RETICCTPCT in the last 72 hours. Sepsis Labs: Recent Labs  Lab 06/22/19 2123 06/22/19 2321 06/23/19 0807  LATICACIDVEN 4.6* 3.5* 1.8    Recent Results (from the past 240 hour(s))  Urine culture     Status: Abnormal   Collection Time: 06/22/19  7:52 PM   Specimen: Urine, Clean Catch  Result Value Ref Range Status   Specimen Description   Final    URINE, CLEAN CATCH Performed at [facility], [place] [address] [place], [place] [address]    Special Requests   Final    NONE Performed at [facility], [place] [address], [place], [place] [address]    Culture MULTIPLE SPECIES PRESENT, SUGGEST RECOLLECTION (A)  Final   Report Status 06/24/2019 FINAL  Final  SARS CORONAVIRUS 2 (TAT 6-24 HRS) Nasopharyngeal Nasopharyngeal Swab     Status: None   Collection Time: 06/23/19 12:46 AM   Specimen: Nasopharyngeal Swab  Result Value Ref Range Status   SARS  Coronavirus 2 NEGATIVE NEGATIVE Final    Comment: (NOTE) SARS-CoV-2 target nucleic acids are NOT DETECTED. The SARS-CoV-2 RNA is generally detectable in upper and lower respiratory specimens during the acute phase of infection. Negative results do not preclude SARS-CoV-2 infection, do not rule out co-infections with other pathogens, and should not be used as the sole basis for treatment or other patient management decisions. Negative results must be combined with clinical observations, patient history, and epidemiological information. The expected result is Negative. Fact Sheet for Patients: SugarRoll.be Fact Sheet for Healthcare Providers: https://www.woods-mathews.com/ This test is not yet approved or cleared by the [place] FDA and  has been authorized for detection and/or diagnosis of SARS-CoV-2 by FDA under an Emergency Use Authorization (EUA). This EUA will remain  in effect (meaning this test can be used) for the duration of the COVID-19 declaration under Section 56 4(b)(1) of the Act, 21 U.S.C. section 360bbb-3(b)(1), unless the authorization is terminated or revoked sooner. Performed at [facility], [place] [address] [place],  [address]          Radiology Studies: No results found.  Scheduled Meds: . alum & mag hydroxide-simeth  30 mL Oral Once   And  . lidocaine  15 mL Oral Once  . amLODipine  10 mg Oral q morning - 10a  . apixaban  5 mg Oral BID  . aspirin EC  81 mg Oral Daily  . atorvastatin  10 mg Oral q1800  . DULoxetine  40 mg Oral BID  . hydrALAZINE  25 mg Oral TID  . insulin aspart  0-15 Units Subcutaneous TID WC  . insulin aspart  0-5 Units Subcutaneous QHS  . insulin glargine  45 Units Subcutaneous Daily  . loratadine  10 mg Oral Daily  . melatonin  6 mg Oral QHS  . metoCLOPramide (REGLAN) injection  10 mg Intravenous Q8H  . metoprolol succinate  25 mg Oral Daily  . pantoprazole  40  mg Oral BID   Continuous Infusions: . sodium chloride 75 mL/hr at 06/25/19 1011     LOS: 3 days     Cordelia Poche, MD [facility] 06/25/2019, 1:31 PM  If 7PM-7AM, please contact night-coverage [email or web address]

## 2019-06-25 NOTE — Progress Notes (Signed)
PT Cancellation Note  Patient Details Name: Deborah [name] MRN: [id number] DOB: 04/13/1965   Cancelled Treatment:    Reason Eval/Treat Not Completed: Patient declined, stated that she does not need/want PT. States that she is in a WC all day long at home. PT will sign off. Please reorder if pt. Agreeable.  [name] 06/25/2019, 4:31 PM  [place] Pager [phone] Office [phone]

## 2019-06-25 NOTE — Care Management Important Message (Signed)
Important Message  Patient Details IM Letter given to Lennart Pall SW Case Manager to present to the Patient Name: Deborah [name] MRN: [id number] Date of Birth: April 14, 1965   Medicare Important Message Given:  Yes     Kerin Salen 06/25/2019, 11:07 AM

## 2019-06-26 DIAGNOSIS — I5022 Chronic systolic (congestive) heart failure: Secondary | ICD-10-CM | POA: Diagnosis not present

## 2019-06-26 DIAGNOSIS — E1143 Type 2 diabetes mellitus with diabetic autonomic (poly)neuropathy: Secondary | ICD-10-CM | POA: Diagnosis not present

## 2019-06-26 DIAGNOSIS — I10 Essential (primary) hypertension: Secondary | ICD-10-CM | POA: Diagnosis not present

## 2019-06-26 DIAGNOSIS — E1110 Type 2 diabetes mellitus with ketoacidosis without coma: Secondary | ICD-10-CM | POA: Diagnosis not present

## 2019-06-26 LAB — GLUCOSE, CAPILLARY
Glucose-Capillary: 186 mg/dL — ABNORMAL HIGH (ref 70–99)
Glucose-Capillary: 232 mg/dL — ABNORMAL HIGH (ref 70–99)

## 2019-06-26 LAB — BASIC METABOLIC PANEL
Anion gap: 9 (ref 5–15)
BUN: 31 mg/dL — ABNORMAL HIGH (ref 6–20)
CO2: 21 mmol/L — ABNORMAL LOW (ref 22–32)
Calcium: 8.8 mg/dL — ABNORMAL LOW (ref 8.9–10.3)
Chloride: 108 mmol/L (ref 98–111)
Creatinine, Ser: 1.29 mg/dL — ABNORMAL HIGH (ref 0.44–1.00)
GFR calc Af Amer: 54 mL/min — ABNORMAL LOW (ref >60)
GFR calc non Af Amer: 47 mL/min — ABNORMAL LOW (ref >60)
Glucose, Bld: 259 mg/dL — ABNORMAL HIGH (ref 70–99)
Potassium: 3.7 mmol/L (ref 3.5–5.1)
Sodium: 138 mmol/L (ref 135–145)

## 2019-06-26 MED ORDER — TORSEMIDE 20 MG PO TABS: 40.0000 mg | ORAL_TABLET | Freq: Every day | ORAL | Status: DC

## 2019-06-26 MED ORDER — NOVOLOG FLEXPEN 100 UNIT/ML ~~LOC~~ SOPN: 15.0000 [IU] | PEN_INJECTOR | Freq: Three times a day (TID) | SUBCUTANEOUS | 0 refills | Status: DC

## 2019-06-26 NOTE — Plan of Care (Signed)
Patient was given discharge instructions and all questions were answered. Patient taken to the main entrance via wheelchair.

## 2019-06-26 NOTE — Discharge Summary (Signed)
Physician Discharge Summary  Deborah [name] NXG:[id number] DOB: Feb 15, 1966 DOA: 06/22/2019  PCP: Deborah Alpha, DO  Admit date: 06/22/2019 Discharge date: 06/26/2019  Admitted From: Home Disposition: Home  Recommendations for Outpatient Follow-up:  1. Follow up with PCP in 1 week 2. Please follow up on the following pending results: None  Home Health: None Equipment/Devices: None  Discharge Condition: Stable CODE STATUS: Full code Diet recommendation: Heart healthy/carb modified   Brief/Interim Summary:  Admission HPI written by Deborah Aloe, MD   Chief Complaint: DKA  HPI: Deborah [name] is a 54 y.o. female with medical history significant of DM2, gastroparesis, HFrEF.  Pt just admitted for DKA earlier this month.  Pt returns to ED with c/o N/V abd pain, chest discomfort, fatigue.  Symptoms similar to prior DKA.  Taking meds as directed.  No dysuria, no diarrhea.    Hospital course:  DKA Unknown etiology although patient states she has been feeling a little "sick." Made worse by nausea/vomiting. Patient started on an insulin drip on admission with quick resolution of acidosis and anion gap. She was transitioned to Lantus 45 units in addition to sliding scale insulin.  Diabetes mellitus, type 2 Patient is on Lantus 45 units daily and Novolog. Hemoglobin A1C of 8.3%. Continue Lantus 45 units and decrease to Novolog 14 units TID with meals.  Essential hypertension Continue amlodipine  Diabetic gastroparesis Patient is on Reglan as an outpatient. Possibly contributed to DKA episode. Continue Reglan.  Chronic systolic heart failure EF of 40-45%. Stable.  Paroxysmal atrial fibrillation Continue Eliquis  Seasonal allergies Continue antihistamines  GERD Continue Protonix  Hyperlipidemia Continue Lipitor  AKI on CKD stage IIIb Baseline creatinine of 1.3. Worsened overnight. Concern possibly secondary to dehydration from  continued vomiting. Peak creatinine of 1.83. Given IV fluids with resolution of AKI,  Discharge Diagnoses:  Principal Problem:   DKA (diabetic ketoacidoses) ([place]) Active Problems:   Essential hypertension, benign   Uncontrolled type 2 diabetes mellitus with diabetic neuropathy, with long-term current use of insulin (HCC)   Diabetic gastroparesis (HCC)   Chronic systolic CHF (congestive heart failure) [facility])    Discharge Instructions  Discharge Instructions    Increase activity slowly   Complete by: As directed      Allergies as of 06/26/2019      Reactions   Contrast Media [iodinated Diagnostic Agents] Anaphylaxis   Cardiac arrest   Diazepam [name]   Isovue [iopamidol] Anaphylaxis   Patient had seizure like activity and then code post 100 cc of isovue 300   Lisinopril Anaphylaxis   Tongue and mouth swelling   Penicillins Palpitations   Has patient had a PCN reaction causing immediate rash, facial/tongue/throat swelling, SOB or lightheadedness with hypotension: Yes, heart races Has patient had a PCN reaction causing severe rash involving mucus membranes or skin necrosis: No Has patient had a PCN reaction that required hospitalization: Yes  Has patient had a PCN reaction occurring within the last 10 years: No   Acetaminophen Nausea Only, Other (See Comments)   Irritates stomach ulcer  Abdominal pain   Tolmetin Nausea Only, Other (See Comments)   ULCER   Aspirin Other (See Comments)   Irritates stomach ulcer    Food Hives, Swelling   Reaction to Carrots, ketchup    Nsaids Other (See Comments)   ULCER   Tramadol Nausea And Vomiting      Medication List    TAKE these medications   albuterol (2.5 MG/3ML) 0.083% nebulizer solution  Commonly known as: PROVENTIL Take 3 mLs (2.5 mg total) by nebulization every 6 (six) hours as needed for wheezing or shortness of breath.   amLODipine 10 MG tablet Commonly known as: NORVASC Take 10 mg by mouth every morning.     apixaban 5 MG Tabs tablet Commonly known as: ELIQUIS Take 1 tablet (5 mg total) by mouth 2 (two) times daily.   aspirin EC 81 MG tablet Take 81 mg by mouth daily.   atorvastatin 10 MG tablet Commonly known as: LIPITOR Take 1 tablet (10 mg total) by mouth daily.   blood glucose meter kit and supplies Kit Dispense based on patient and insurance preference. Use up to four times daily as directed. (FOR ICD-9 250.00, 250.01).   cetirizine 10 MG tablet Commonly known as: ZYRTEC Take 1 tablet (10 mg total) by mouth daily.   dicyclomine 20 MG tablet Commonly known as: BENTYL TAKE 1 TABLET (20 MG TOTAL) BY MOUTH 3 (THREE) TIMES DAILY AS NEEDED FOR SPASMS (ABDOMINAL CRAMPING).   DULoxetine HCl 40 MG Cpep TAKE ONE CAPSULE BY MOUTH TWICE DAILY   fluticasone 50 MCG/ACT nasal spray Commonly known as: FLONASE Place 2 sprays into both nostrils daily as needed for allergies or rhinitis.   hydrALAZINE 25 MG tablet Commonly known as: APRESOLINE TAKE ONE TABLET BY MOUTH THREE TIMES A DAY. FOR BLOOD PRESSURE What changed: See the new instructions.   Lantus SoloStar 100 UNIT/ML Solostar Pen Generic drug: insulin glargine Inject 45 Units into the skin at bedtime.   loperamide 2 MG capsule Commonly known as: IMODIUM Take 2 mg by mouth 4 (four) times daily as needed for diarrhea or loose stools.   magnesium oxide 400 (241.3 Mg) MG tablet Commonly known as: MAG-OX Take 1 tablet (400 mg total) by mouth 2 (two) times daily.   melatonin 3 MG Tabs tablet Take 2 tablets (6 mg total) by mouth at bedtime.   metoCLOPramide 10 MG tablet Commonly known as: REGLAN Take 1 tablet (10 mg total) by mouth 3 (three) times daily before meals.   metoprolol succinate 25 MG 24 hr tablet Commonly known as: TOPROL-XL Take 25 mg by mouth daily.   nitroGLYCERIN 0.4 MG SL tablet Commonly known as: NITROSTAT Place 1 tablet (0.4 mg total) under the tongue every 5 (five) minutes as needed for chest pain.    NovoLOG FlexPen 100 UNIT/ML FlexPen Generic drug: insulin aspart Inject 15 Units into the skin 3 (three) times daily with meals. What changed:   how much to take  when to take this   ondansetron 4 MG disintegrating tablet Commonly known as: ZOFRAN-ODT Take 4 mg by mouth every 8 (eight) hours as needed.   pantoprazole 40 MG tablet Commonly known as: PROTONIX Take 1 tablet (40 mg total) by mouth 2 (two) times daily.   torsemide 20 MG tablet Commonly known as: DEMADEX Take 2 tablets (40 mg total) by mouth daily. Hold if having significant vomiting What changed: additional instructions      Follow-up Information    Deborah Alpha, DO. Schedule an appointment as soon as possible for a visit in 1 week(s).   Specialty: Family Medicine Contact information: [address] [address] [phone]          Allergies  Allergen Reactions  . Contrast Media [Iodinated Diagnostic Agents] Anaphylaxis    Cardiac arrest  . Diazepam [name]  . Isovue [Iopamidol] Anaphylaxis    Patient had seizure like activity and then code post 100 cc  of isovue 300  . Lisinopril Anaphylaxis    Tongue and mouth swelling  . Penicillins Palpitations    Has patient had a PCN reaction causing immediate rash, facial/tongue/throat swelling, SOB or lightheadedness with hypotension: Yes, heart races Has patient had a PCN reaction causing severe rash involving mucus membranes or skin necrosis: No Has patient had a PCN reaction that required hospitalization: Yes  Has patient had a PCN reaction occurring within the last 10 years: No   . Acetaminophen Nausea Only and Other (See Comments)    Irritates stomach ulcer  Abdominal pain  . Tolmetin Nausea Only and Other (See Comments)    ULCER  . Aspirin Other (See Comments)    Irritates stomach ulcer   . Food Hives and Swelling    Reaction to Carrots, ketchup   . Nsaids Other (See Comments)    ULCER  . Tramadol Nausea And  Vomiting    Consultations:  None   Procedures/Studies: CT ABDOMEN PELVIS WO CONTRAST  Result Date: 06/10/2019 CLINICAL DATA:  Nausea and vomiting. EXAM: CT ABDOMEN AND PELVIS WITHOUT CONTRAST TECHNIQUE: Multidetector CT imaging of the abdomen and pelvis was performed following the standard protocol without IV contrast. COMPARISON:  May 27, 2019 FINDINGS: Lower chest: The lung bases are clear. The heart size is normal. Hepatobiliary: There is decreased hepatic attenuation suggestive of hepatic steatosis. Status post cholecystectomy.There is no biliary ductal dilation. Pancreas: Normal contours without ductal dilatation. No peripancreatic fluid collection. Spleen: Unremarkable. Adrenals/Urinary Tract: --Adrenal glands: Unremarkable. --Right kidney/ureter: No hydronephrosis or radiopaque kidney stones. --Left kidney/ureter: No hydronephrosis or radiopaque kidney stones. --Urinary bladder: Unremarkable. Stomach/Bowel: --Stomach/Duodenum: No hiatal hernia or other gastric abnormality. Normal duodenal course and caliber. --Small bowel: Unremarkable. --Colon: Unremarkable. --Appendix: Normal. Vascular/Lymphatic: Atherosclerotic calcification is present within the non-aneurysmal abdominal aorta, without hemodynamically significant stenosis. --No retroperitoneal lymphadenopathy. --No mesenteric lymphadenopathy. --No pelvic or inguinal lymphadenopathy. Reproductive: A dominant uterine fibroid is again noted measuring approximately 5.1 cm. Other: No ascites or free air. The abdominal wall is normal. Musculoskeletal. No acute displaced fractures. IMPRESSION: 1. No CT findings to explain the patient's nausea and vomiting. 2. Hepatic steatosis. 3. Aortic Atherosclerosis (ICD10-I70.0). Electronically Signed   By: Constance Holster M.D.   On: 06/10/2019 02:50   DG Chest 2 View  Result Date: 06/22/2019 CLINICAL DATA:  Vomiting, central chest and abdominal pain, history of diabetic ketoacidosis EXAM: CHEST - 2 VIEW  COMPARISON:  06/10/2019 FINDINGS: The heart size and mediastinal contours are within normal limits. Both lungs are clear. The visualized skeletal structures are unremarkable. IMPRESSION: No active cardiopulmonary disease. Electronically Signed   By: Randa Ngo M.D.   On: 06/22/2019 19:24   US RENAL  Result Date: 06/10/2019 CLINICAL DATA:  AKI EXAM: RENAL / URINARY TRACT ULTRASOUND COMPLETE COMPARISON:  None. FINDINGS: Right Kidney: Renal measurements: 12 x 4.8 x 5.3 cm = volume: 160.6 mL . Echogenicity within normal limits. No mass or hydronephrosis visualized. Left Kidney: Renal measurements: 12 x 5.9 x 4.9 cm = volume: 22 mL. Echogenicity within normal limits. No mass or hydronephrosis visualized. Bladder: Appears normal for degree of bladder distention. Other: Increased liver echogenicity likely reflecting steatosis. IMPRESSION: No hydronephrosis. Electronically Signed   By: Macy Mis M.D.   On: 06/10/2019 16:28   DG Chest Port 1 View  Result Date: 06/10/2019 CLINICAL DATA:  [name] EXAM: PORTABLE CHEST 1 VIEW COMPARISON:  06/05/2019 FINDINGS: Cardiac shadow is enlarged but stable. Lungs are well aerated bilaterally. No focal infiltrate or sizable  effusion is seen. Previously seen vascular congestion has improved. No bony abnormality is noted. IMPRESSION: No acute abnormality noted. Electronically Signed   By: Inez Catalina M.D.   On: 06/10/2019 00:51      Subjective: Two episodes of emesis overnight but otherwise tolerating diet.  Discharge Exam: Vitals:   06/25/19 2151 06/26/19 0447  BP: 134/73 (!) 118/56  Pulse: 88 92  Resp: 20 20  Temp: (!) 97.5 F (36.4 C) (!) 97.5 F (36.4 C)  SpO2: 100% 95%   Vitals:   06/25/19 0602 06/25/19 1309 06/25/19 2151 06/26/19 0447  BP: 100/78 126/76 134/73 (!) 118/56  Pulse: 91 83 88 92  Resp: '16 18 20 20  ' Temp: 98 F (36.7 C) (!) 97.4 F (36.3 C) (!) 97.5 F (36.4 C) (!) 97.5 F (36.4 C)  TempSrc: Oral Oral Oral Oral  SpO2:  97% 100% 100% 95%  Weight:      Height:        General: Pt is alert, awake, not in acute distress Cardiovascular: RRR, S1/S2 +, no rubs, no gallops Respiratory: CTA bilaterally, no wheezing, no rhonchi Abdominal: Soft, NT, ND, bowel sounds + Extremities: no edema, no cyanosis    The results of significant diagnostics from this hospitalization (including imaging, microbiology, ancillary and laboratory) are listed below for reference.     Microbiology: Recent Results (from the past 240 hour(s))  Urine culture     Status: Abnormal   Collection Time: 06/22/19  7:52 PM   Specimen: Urine, Clean Catch  Result Value Ref Range Status   Specimen Description   Final    URINE, CLEAN CATCH Performed at [facility], [place] [address], [place], [place] [address]    Special Requests   Final    NONE Performed at [facility], [place] [address] [place], [place] [address]    Culture MULTIPLE SPECIES PRESENT, SUGGEST RECOLLECTION (A)  Final   Report Status 06/24/2019 FINAL  Final  SARS CORONAVIRUS 2 (TAT 6-24 HRS) Nasopharyngeal Nasopharyngeal Swab     Status: None   Collection Time: 06/23/19 12:46 AM   Specimen: Nasopharyngeal Swab  Result Value Ref Range Status   SARS Coronavirus 2 NEGATIVE NEGATIVE Final    Comment: (NOTE) SARS-CoV-2 target nucleic acids are NOT DETECTED. The SARS-CoV-2 RNA is generally detectable in upper and lower respiratory specimens during the acute phase of infection. Negative results do not preclude SARS-CoV-2 infection, do not rule out co-infections with other pathogens, and should not be used as the sole basis for treatment or other patient management decisions. Negative results must be combined with clinical observations, patient history, and epidemiological information. The expected result is Negative. Fact Sheet for Patients: SugarRoll.be Fact Sheet for Healthcare  Providers: https://www.woods-mathews.com/ This test is not yet approved or cleared by the [place] FDA and  has been authorized for detection and/or diagnosis of SARS-CoV-2 by FDA under an Emergency Use Authorization (EUA). This EUA will remain  in effect (meaning this test can be used) for the duration of the COVID-19 declaration under Section 56 4(b)(1) of the Act, 21 U.S.C. section 360bbb-3(b)(1), unless the authorization is terminated or revoked sooner. Performed at [facility], [place] [address] [place], [place] [address]      Labs: BNP (last 3 results) Recent Labs    03/12/19 0756 05/07/19 1451 06/22/19 2123  BNP 76.8 127.1* 67.3   Basic Metabolic Panel: Recent Labs  Lab 06/23/19 0309 06/24/19 0916 06/24/19 1600 06/25/19 0136 06/26/19 1046  NA 142 136 136 135 138  K 3.3* 3.6 3.9 3.6 3.7  CL 112* 106 104 106 108  CO2 24 22 21* 21* 21*  GLUCOSE 176* 218* 316* 218* 259*  BUN 28* 33* 34* 39* 31*  CREATININE 1.26* 1.60* 1.83* 1.63* 1.29*  CALCIUM 8.2* 8.2* 8.2* 8.3* 8.8*   Liver Function Tests: Recent Labs  Lab 06/22/19 2123  AST 21  ALT 19  ALKPHOS 101  BILITOT 0.9  PROT 7.6  ALBUMIN 3.4*   Recent Labs  Lab 06/22/19 2123  LIPASE 22   No results for input(s): AMMONIA in the last 168 hours. CBC: Recent Labs  Lab 06/22/19 2123  WBC 14.5*  HGB 10.7*  HCT 34.0*  MCV 85.9  PLT 394   Cardiac Enzymes: No results for input(s): CKTOTAL, CKMB, CKMBINDEX, TROPONINI in the last 168 hours. BNP: Invalid input(s): POCBNP CBG: Recent Labs  Lab 06/25/19 1150 06/25/19 1624 06/25/19 2129 06/26/19 0714 06/26/19 1147  GLUCAP 239* 247* 245* 186* 232*   D-Dimer No results for input(s): DDIMER in the last 72 hours. Hgb A1c No results for input(s): HGBA1C in the last 72 hours. Lipid Profile No results for input(s): CHOL, HDL, LDLCALC, TRIG, CHOLHDL, LDLDIRECT in the last 72 hours. Thyroid function studies No results for input(s):  TSH, T4TOTAL, T3FREE, THYROIDAB in the last 72 hours.  Invalid input(s): FREET3 Anemia work up No results for input(s): VITAMINB12, FOLATE, FERRITIN, TIBC, IRON, RETICCTPCT in the last 72 hours. Urinalysis    Component Value Date/Time   COLORURINE YELLOW 06/22/2019 1952   APPEARANCEUR CLEAR 06/22/2019 1952   LABSPEC 1.012 06/22/2019 1952   PHURINE 6.0 06/22/2019 1952   GLUCOSEU NEGATIVE 06/22/2019 [place] NEGATIVE 06/22/2019 [place] NEGATIVE 06/22/2019 [place] NEGATIVE 06/22/2019 1952   PROTEINUR >=300 (A) 06/22/2019 1952   UROBILINOGEN 0.2 10/02/2013 2108   NITRITE NEGATIVE 06/22/2019 1952   LEUKOCYTESUR NEGATIVE 06/22/2019 1952   Sepsis Labs Invalid input(s): PROCALCITONIN,  WBC,  LACTICIDVEN Microbiology Recent Results (from the past 240 hour(s))  Urine culture     Status: Abnormal   Collection Time: 06/22/19  7:52 PM   Specimen: Urine, Clean Catch  Result Value Ref Range Status   Specimen Description   Final    URINE, CLEAN CATCH Performed at [facility], [place] [address] [place], [place] [address]    Special Requests   Final    NONE Performed at [facility], [place] [address] [place], [place] [address]    Culture MULTIPLE SPECIES PRESENT, SUGGEST RECOLLECTION (A)  Final   Report Status 06/24/2019 FINAL  Final  SARS CORONAVIRUS 2 (TAT 6-24 HRS) Nasopharyngeal Nasopharyngeal Swab     Status: None   Collection Time: 06/23/19 12:46 AM   Specimen: Nasopharyngeal Swab  Result Value Ref Range Status   SARS Coronavirus 2 NEGATIVE NEGATIVE Final    Comment: (NOTE) SARS-CoV-2 target nucleic acids are NOT DETECTED. The SARS-CoV-2 RNA is generally detectable in upper and lower respiratory specimens during the acute phase of infection. Negative results do not preclude SARS-CoV-2 infection, do not rule out co-infections with other pathogens, and should not be used as the sole basis for treatment or other patient  management decisions. Negative results must be combined with clinical observations, patient history, and epidemiological information. The expected result is Negative. Fact Sheet for Patients: SugarRoll.be Fact Sheet for Healthcare Providers: https://www.woods-mathews.com/ This test is not yet approved or cleared by the [place] FDA and  has been authorized for detection and/or diagnosis of SARS-CoV-2 by FDA under an Emergency Use Authorization (EUA). This EUA will remain  in effect (meaning this test can be used) for the duration of the COVID-19 declaration under Section 56 4(b)(1) of the Act, 21 U.S.C. section 360bbb-3(b)(1), unless the authorization is terminated or revoked sooner. Performed at [facility], [place] [address], [place], [place] [address]      Time coordinating discharge: 35 minutes  SIGNED:   Cordelia Poche, MD [facility] 06/26/2019, 12:19 PM

## 2019-06-26 NOTE — Progress Notes (Signed)
OT Cancellation Note  Patient Details Name: TAMARA MONTEITH MRN: [id number] DOB: February 26, 1966   Cancelled Treatment:    Reason Eval/Treat Not Completed: Other (comment) Pt states 'I don't need it". OT signing off.   Ramond Dial, OT/L   Acute OT Clinical Specialist Acute Rehabilitation Services Pager [phone] Office [phone]

## 2019-06-26 NOTE — Discharge Instructions (Signed)
Deborah [name],  You were in the hospital because of DKA and vomiting. This may have been because of your gastroparesis. Your symptoms have improved. Please follow-up with your PCP.

## 2019-06-29 ENCOUNTER — Encounter (HOSPITAL_COMMUNITY): Payer: Self-pay

## 2019-06-29 ENCOUNTER — Inpatient Hospital Stay (HOSPITAL_COMMUNITY)
Admission: EM | Admit: 2019-06-29 | Discharge: 2019-07-04 | DRG: 638 | Disposition: A | Discharge status: Home / Self Care | Payer: Medicare Other | Attending: Internal Medicine | Admitting: Internal Medicine

## 2019-06-29 ENCOUNTER — Emergency Department (HOSPITAL_COMMUNITY): Payer: Medicare Other

## 2019-06-29 ENCOUNTER — Other Ambulatory Visit: Payer: Self-pay

## 2019-06-29 DIAGNOSIS — I10 Essential (primary) hypertension: Secondary | ICD-10-CM | POA: Diagnosis present

## 2019-06-29 DIAGNOSIS — E1143 Type 2 diabetes mellitus with diabetic autonomic (poly)neuropathy: Secondary | ICD-10-CM | POA: Diagnosis not present

## 2019-06-29 DIAGNOSIS — I5022 Chronic systolic (congestive) heart failure: Secondary | ICD-10-CM | POA: Diagnosis not present

## 2019-06-29 DIAGNOSIS — Z79899 Other long term (current) drug therapy: Secondary | ICD-10-CM

## 2019-06-29 DIAGNOSIS — G8929 Other chronic pain: Secondary | ICD-10-CM | POA: Diagnosis present

## 2019-06-29 DIAGNOSIS — Z8701 Personal history of pneumonia (recurrent): Secondary | ICD-10-CM

## 2019-06-29 DIAGNOSIS — E1110 Type 2 diabetes mellitus with ketoacidosis without coma: Secondary | ICD-10-CM | POA: Diagnosis not present

## 2019-06-29 DIAGNOSIS — Z794 Long term (current) use of insulin: Secondary | ICD-10-CM

## 2019-06-29 DIAGNOSIS — M109 Gout, unspecified: Secondary | ICD-10-CM | POA: Diagnosis present

## 2019-06-29 DIAGNOSIS — E876 Hypokalemia: Secondary | ICD-10-CM | POA: Diagnosis present

## 2019-06-29 DIAGNOSIS — Z20822 Contact with and (suspected) exposure to covid-19: Secondary | ICD-10-CM | POA: Diagnosis present

## 2019-06-29 DIAGNOSIS — N1832 Chronic kidney disease, stage 3b: Secondary | ICD-10-CM | POA: Diagnosis present

## 2019-06-29 DIAGNOSIS — I428 Other cardiomyopathies: Secondary | ICD-10-CM | POA: Diagnosis present

## 2019-06-29 DIAGNOSIS — Z7982 Long term (current) use of aspirin: Secondary | ICD-10-CM

## 2019-06-29 DIAGNOSIS — N182 Chronic kidney disease, stage 2 (mild): Secondary | ICD-10-CM | POA: Diagnosis present

## 2019-06-29 DIAGNOSIS — Z91041 Radiographic dye allergy status: Secondary | ICD-10-CM

## 2019-06-29 DIAGNOSIS — F32A Depression, unspecified: Secondary | ICD-10-CM | POA: Diagnosis present

## 2019-06-29 DIAGNOSIS — F419 Anxiety disorder, unspecified: Secondary | ICD-10-CM | POA: Diagnosis present

## 2019-06-29 DIAGNOSIS — E785 Hyperlipidemia, unspecified: Secondary | ICD-10-CM | POA: Diagnosis present

## 2019-06-29 DIAGNOSIS — Z833 Family history of diabetes mellitus: Secondary | ICD-10-CM

## 2019-06-29 DIAGNOSIS — E1165 Type 2 diabetes mellitus with hyperglycemia: Secondary | ICD-10-CM

## 2019-06-29 DIAGNOSIS — E872 Acidosis, unspecified: Secondary | ICD-10-CM

## 2019-06-29 DIAGNOSIS — I480 Paroxysmal atrial fibrillation: Secondary | ICD-10-CM | POA: Diagnosis present

## 2019-06-29 DIAGNOSIS — Z8249 Family history of ischemic heart disease and other diseases of the circulatory system: Secondary | ICD-10-CM

## 2019-06-29 DIAGNOSIS — I4581 Long QT syndrome: Secondary | ICD-10-CM | POA: Diagnosis present

## 2019-06-29 DIAGNOSIS — Z8711 Personal history of peptic ulcer disease: Secondary | ICD-10-CM

## 2019-06-29 DIAGNOSIS — N179 Acute kidney failure, unspecified: Secondary | ICD-10-CM | POA: Diagnosis present

## 2019-06-29 DIAGNOSIS — Z8674 Personal history of sudden cardiac arrest: Secondary | ICD-10-CM

## 2019-06-29 DIAGNOSIS — K219 Gastro-esophageal reflux disease without esophagitis: Secondary | ICD-10-CM | POA: Diagnosis present

## 2019-06-29 DIAGNOSIS — I130 Hypertensive heart and chronic kidney disease with heart failure and stage 1 through stage 4 chronic kidney disease, or unspecified chronic kidney disease: Secondary | ICD-10-CM | POA: Diagnosis present

## 2019-06-29 DIAGNOSIS — R0600 Dyspnea, unspecified: Secondary | ICD-10-CM

## 2019-06-29 DIAGNOSIS — E1140 Type 2 diabetes mellitus with diabetic neuropathy, unspecified: Secondary | ICD-10-CM

## 2019-06-29 DIAGNOSIS — Z888 Allergy status to other drugs, medicaments and biological substances status: Secondary | ICD-10-CM

## 2019-06-29 DIAGNOSIS — R000 Tachycardia, unspecified: Secondary | ICD-10-CM | POA: Diagnosis present

## 2019-06-29 DIAGNOSIS — Z7901 Long term (current) use of anticoagulants: Secondary | ICD-10-CM

## 2019-06-29 DIAGNOSIS — Z880 Allergy status to penicillin: Secondary | ICD-10-CM

## 2019-06-29 DIAGNOSIS — K3184 Gastroparesis: Secondary | ICD-10-CM

## 2019-06-29 DIAGNOSIS — F329 Major depressive disorder, single episode, unspecified: Secondary | ICD-10-CM | POA: Diagnosis present

## 2019-06-29 DIAGNOSIS — N184 Chronic kidney disease, stage 4 (severe): Secondary | ICD-10-CM | POA: Diagnosis present

## 2019-06-29 DIAGNOSIS — IMO0002 Reserved for concepts with insufficient information to code with codable children: Secondary | ICD-10-CM

## 2019-06-29 DIAGNOSIS — Z8673 Personal history of transient ischemic attack (TIA), and cerebral infarction without residual deficits: Secondary | ICD-10-CM

## 2019-06-29 DIAGNOSIS — M797 Fibromyalgia: Secondary | ICD-10-CM | POA: Diagnosis present

## 2019-06-29 DIAGNOSIS — D72829 Elevated white blood cell count, unspecified: Secondary | ICD-10-CM | POA: Diagnosis present

## 2019-06-29 DIAGNOSIS — Z886 Allergy status to analgesic agent status: Secondary | ICD-10-CM

## 2019-06-29 DIAGNOSIS — E1122 Type 2 diabetes mellitus with diabetic chronic kidney disease: Secondary | ICD-10-CM | POA: Diagnosis present

## 2019-06-29 DIAGNOSIS — D631 Anemia in chronic kidney disease: Secondary | ICD-10-CM | POA: Diagnosis present

## 2019-06-29 DIAGNOSIS — R0682 Tachypnea, not elsewhere classified: Secondary | ICD-10-CM | POA: Diagnosis present

## 2019-06-29 DIAGNOSIS — Z6841 Body Mass Index (BMI) 40.0 and over, adult: Secondary | ICD-10-CM

## 2019-06-29 LAB — BASIC METABOLIC PANEL
Anion gap: 19 — ABNORMAL HIGH (ref 5–15)
Anion gap: 14 (ref 5–15)
Anion gap: 9 (ref 5–15)
BUN: 26 mg/dL — ABNORMAL HIGH (ref 6–20)
BUN: 26 mg/dL — ABNORMAL HIGH (ref 6–20)
BUN: 23 mg/dL — ABNORMAL HIGH (ref 6–20)
CO2: 22 mmol/L (ref 22–32)
CO2: 24 mmol/L (ref 22–32)
CO2: 26 mmol/L (ref 22–32)
Calcium: 9.2 mg/dL (ref 8.9–10.3)
Calcium: 8.6 mg/dL — ABNORMAL LOW (ref 8.9–10.3)
Calcium: 7.8 mg/dL — ABNORMAL LOW (ref 8.9–10.3)
Chloride: 97 mmol/L — ABNORMAL LOW (ref 98–111)
Chloride: 99 mmol/L (ref 98–111)
Chloride: 105 mmol/L (ref 98–111)
Creatinine, Ser: 1.47 mg/dL — ABNORMAL HIGH (ref 0.44–1.00)
Creatinine, Ser: 1.5 mg/dL — ABNORMAL HIGH (ref 0.44–1.00)
Creatinine, Ser: 1.27 mg/dL — ABNORMAL HIGH (ref 0.44–1.00)
GFR calc Af Amer: 46 mL/min — ABNORMAL LOW (ref >60)
GFR calc Af Amer: 45 mL/min — ABNORMAL LOW (ref >60)
GFR calc Af Amer: 55 mL/min — ABNORMAL LOW (ref >60)
GFR calc non Af Amer: 40 mL/min — ABNORMAL LOW (ref >60)
GFR calc non Af Amer: 39 mL/min — ABNORMAL LOW (ref >60)
GFR calc non Af Amer: 47 mL/min — ABNORMAL LOW (ref >60)
Glucose, Bld: 433 mg/dL — ABNORMAL HIGH (ref 70–99)
Glucose, Bld: 390 mg/dL — ABNORMAL HIGH (ref 70–99)
Glucose, Bld: 175 mg/dL — ABNORMAL HIGH (ref 70–99)
Potassium: 3 mmol/L — ABNORMAL LOW (ref 3.5–5.1)
Potassium: 3.3 mmol/L — ABNORMAL LOW (ref 3.5–5.1)
Potassium: 2.8 mmol/L — ABNORMAL LOW (ref 3.5–5.1)
Sodium: 138 mmol/L (ref 135–145)
Sodium: 137 mmol/L (ref 135–145)
Sodium: 140 mmol/L (ref 135–145)

## 2019-06-29 LAB — HEPATIC FUNCTION PANEL
ALT: 16 U/L (ref 0–44)
AST: 19 U/L (ref 15–41)
Albumin: 3.5 g/dL (ref 3.5–5.0)
Alkaline Phosphatase: 122 U/L (ref 38–126)
Bilirubin, Direct: 0.1 mg/dL (ref 0.0–0.2)
Indirect Bilirubin: 0.7 mg/dL (ref 0.3–0.9)
Total Bilirubin: 0.8 mg/dL (ref 0.3–1.2)
Total Protein: 8 g/dL (ref 6.5–8.1)

## 2019-06-29 LAB — CBC WITH DIFFERENTIAL/PLATELET
Abs Immature Granulocytes: 0.08 10*3/uL — ABNORMAL HIGH (ref 0.00–0.07)
Basophils Absolute: 0.1 10*3/uL (ref 0.0–0.1)
Basophils Relative: 0 %
Eosinophils Absolute: 0.2 10*3/uL (ref 0.0–0.5)
Eosinophils Relative: 1 %
HCT: 33.5 % — ABNORMAL LOW (ref 36.0–46.0)
Hemoglobin: 10.6 g/dL — ABNORMAL LOW (ref 12.0–15.0)
Immature Granulocytes: 1 %
Lymphocytes Relative: 13 %
Lymphs Abs: 1.8 10*3/uL (ref 0.7–4.0)
MCH: 26.7 pg (ref 26.0–34.0)
MCHC: 31.6 g/dL (ref 30.0–36.0)
MCV: 84.4 fL (ref 80.0–100.0)
Monocytes Absolute: 0.6 10*3/uL (ref 0.1–1.0)
Monocytes Relative: 5 %
Neutro Abs: 11.2 10*3/uL — ABNORMAL HIGH (ref 1.7–7.7)
Neutrophils Relative %: 80 %
Platelets: 427 10*3/uL — ABNORMAL HIGH (ref 150–400)
RBC: 3.97 MIL/uL (ref 3.87–5.11)
RDW: 14.8 % (ref 11.5–15.5)
WBC: 13.9 10*3/uL — ABNORMAL HIGH (ref 4.0–10.5)
nRBC: 0 % (ref 0.0–0.2)

## 2019-06-29 LAB — LACTIC ACID, PLASMA
Lactic Acid, Venous: 4.5 mmol/L (ref 0.5–1.9)
Lactic Acid, Venous: 4.2 mmol/L (ref 0.5–1.9)

## 2019-06-29 LAB — CBG MONITORING, ED
Glucose-Capillary: 383 mg/dL — ABNORMAL HIGH (ref 70–99)
Glucose-Capillary: 393 mg/dL — ABNORMAL HIGH (ref 70–99)
Glucose-Capillary: 345 mg/dL — ABNORMAL HIGH (ref 70–99)
Glucose-Capillary: 259 mg/dL — ABNORMAL HIGH (ref 70–99)
Glucose-Capillary: 209 mg/dL — ABNORMAL HIGH (ref 70–99)
Glucose-Capillary: 170 mg/dL — ABNORMAL HIGH (ref 70–99)
Glucose-Capillary: 144 mg/dL — ABNORMAL HIGH (ref 70–99)
Glucose-Capillary: 145 mg/dL — ABNORMAL HIGH (ref 70–99)
Glucose-Capillary: 201 mg/dL — ABNORMAL HIGH (ref 70–99)
Glucose-Capillary: 165 mg/dL — ABNORMAL HIGH (ref 70–99)

## 2019-06-29 LAB — URINALYSIS, ROUTINE W REFLEX MICROSCOPIC
Bacteria, UA: NONE SEEN
Bilirubin Urine: NEGATIVE
Glucose, UA: >=500 mg/dL — AB
Ketones, ur: NEGATIVE mg/dL
Leukocytes,Ua: NEGATIVE
Nitrite: NEGATIVE
Protein, ur: 100 mg/dL — AB
Specific Gravity, Urine: 1.007 (ref 1.005–1.030)
pH: 5 (ref 5.0–8.0)

## 2019-06-29 LAB — BLOOD GAS, VENOUS
Acid-Base Excess: 1.3 mmol/L (ref 0.0–2.0)
Bicarbonate: 22.6 mmol/L (ref 20.0–28.0)
FIO2: 21
O2 Saturation: 69.1 %
Patient temperature: 98.6
pCO2, Ven: 25.4 mmHg — ABNORMAL LOW (ref 44.0–60.0)
pH, Ven: 7.557 — ABNORMAL HIGH (ref 7.250–7.430)
pO2, Ven: 35.6 mmHg (ref 32.0–45.0)

## 2019-06-29 LAB — BETA-HYDROXYBUTYRIC ACID
Beta-Hydroxybutyric Acid: 0.64 mmol/L — ABNORMAL HIGH (ref 0.05–0.27)
Beta-Hydroxybutyric Acid: 0.07 mmol/L (ref 0.05–0.27)

## 2019-06-29 LAB — LIPASE, BLOOD: Lipase: 39 U/L (ref 11–51)

## 2019-06-29 LAB — RESPIRATORY PANEL BY RT PCR (FLU A&B, COVID)
Influenza A by PCR: NEGATIVE
Influenza B by PCR: NEGATIVE
SARS Coronavirus 2 by RT PCR: NEGATIVE

## 2019-06-29 LAB — MAGNESIUM: Magnesium: 0.9 mg/dL — CL (ref 1.7–2.4)

## 2019-06-29 LAB — TROPONIN I (HIGH SENSITIVITY)
Troponin I (High Sensitivity): 28 ng/L — ABNORMAL HIGH (ref <18)
Troponin I (High Sensitivity): 24 ng/L — ABNORMAL HIGH (ref <18)

## 2019-06-29 MED ORDER — DULOXETINE HCL 20 MG PO CPEP
40.0000 mg | ORAL_CAPSULE | Freq: Two times a day (BID) | ORAL | Status: DC
  Administered 2019-06-29 – 2019-07-04 (×10): 40 mg via ORAL
  Filled 2019-06-29 (×13): qty 2

## 2019-06-29 MED ORDER — ACETAMINOPHEN 325 MG PO TABS
650.0000 mg | ORAL_TABLET | Freq: Four times a day (QID) | ORAL | Status: DC | PRN
  Filled 2019-06-29: qty 2

## 2019-06-29 MED ORDER — INSULIN ASPART 100 UNIT/ML ~~LOC~~ SOLN
0.0000 [IU] | Freq: Every day | SUBCUTANEOUS | Status: DC
  Administered 2019-06-30 – 2019-07-02 (×3): 2 [IU] via SUBCUTANEOUS
  Administered 2019-07-03: 22:00:00 3 [IU] via SUBCUTANEOUS
  Filled 2019-06-29: qty 0.05

## 2019-06-29 MED ORDER — ONDANSETRON HCL 4 MG/2ML IJ SOLN
4.0000 mg | Freq: Four times a day (QID) | INTRAMUSCULAR | Status: DC | PRN
  Administered 2019-06-29 – 2019-07-03 (×5): 4 mg via INTRAVENOUS
  Filled 2019-06-29 (×5): qty 2

## 2019-06-29 MED ORDER — LACTATED RINGERS IV BOLUS
1000.0000 mL | Freq: Once | INTRAVENOUS | Status: AC
  Administered 2019-06-29: 1000 mL via INTRAVENOUS

## 2019-06-29 MED ORDER — ALBUTEROL SULFATE (2.5 MG/3ML) 0.083% IN NEBU: 2.5000 mg | INHALATION_SOLUTION | Freq: Four times a day (QID) | RESPIRATORY_TRACT | Status: DC | PRN

## 2019-06-29 MED ORDER — POTASSIUM CHLORIDE 10 MEQ/100ML IV SOLN
10.0000 meq | Freq: Once | INTRAVENOUS | Status: AC
  Administered 2019-06-29: 11:00:00 10 meq via INTRAVENOUS
  Filled 2019-06-29: qty 100

## 2019-06-29 MED ORDER — HYDROMORPHONE HCL 1 MG/ML IJ SOLN
1.0000 mg | Freq: Once | INTRAMUSCULAR | Status: AC
  Administered 2019-06-29: 11:00:00 1 mg via INTRAVENOUS
  Filled 2019-06-29: qty 1

## 2019-06-29 MED ORDER — ONDANSETRON HCL 4 MG PO TABS: 4.0000 mg | ORAL_TABLET | Freq: Four times a day (QID) | ORAL | Status: DC | PRN

## 2019-06-29 MED ORDER — ONDANSETRON HCL 4 MG/2ML IJ SOLN
4.0000 mg | Freq: Once | INTRAMUSCULAR | Status: AC
  Administered 2019-06-29: 4 mg via INTRAVENOUS
  Filled 2019-06-29: qty 2

## 2019-06-29 MED ORDER — FLUTICASONE PROPIONATE 50 MCG/ACT NA SUSP
2.0000 | Freq: Every day | NASAL | Status: DC | PRN
  Filled 2019-06-29: qty 16

## 2019-06-29 MED ORDER — POTASSIUM CHLORIDE 10 MEQ/100ML IV SOLN
10.0000 meq | INTRAVENOUS | Status: AC
  Administered 2019-06-29 (×4): 10 meq via INTRAVENOUS
  Filled 2019-06-29 (×3): qty 100

## 2019-06-29 MED ORDER — HYDRALAZINE HCL 25 MG PO TABS
25.0000 mg | ORAL_TABLET | Freq: Three times a day (TID) | ORAL | Status: DC
  Administered 2019-06-30 – 2019-07-04 (×13): 25 mg via ORAL
  Filled 2019-06-29 (×13): qty 1

## 2019-06-29 MED ORDER — MORPHINE SULFATE (PF) 2 MG/ML IV SOLN
2.0000 mg | INTRAVENOUS | Status: DC | PRN
  Administered 2019-06-29 (×2): 2 mg via INTRAVENOUS
  Filled 2019-06-29 (×2): qty 1

## 2019-06-29 MED ORDER — METOCLOPRAMIDE HCL 5 MG/ML IJ SOLN
10.0000 mg | Freq: Once | INTRAMUSCULAR | Status: AC
  Administered 2019-06-29: 10:00:00 10 mg via INTRAVENOUS
  Filled 2019-06-29: qty 2

## 2019-06-29 MED ORDER — INSULIN REGULAR(HUMAN) IN NACL 100-0.9 UT/100ML-% IV SOLN
INTRAVENOUS | Status: DC
  Administered 2019-06-29: 13 [IU]/h via INTRAVENOUS
  Filled 2019-06-29: qty 100

## 2019-06-29 MED ORDER — DICYCLOMINE HCL 20 MG PO TABS: 20.0000 mg | ORAL_TABLET | Freq: Three times a day (TID) | ORAL | Status: DC | PRN

## 2019-06-29 MED ORDER — INSULIN ASPART 100 UNIT/ML ~~LOC~~ SOLN
0.0000 [IU] | Freq: Three times a day (TID) | SUBCUTANEOUS | Status: DC
  Administered 2019-06-30: 8 [IU] via SUBCUTANEOUS
  Administered 2019-06-30 (×2): 3 [IU] via SUBCUTANEOUS
  Administered 2019-07-01: 5 [IU] via SUBCUTANEOUS
  Administered 2019-07-01: 8 [IU] via SUBCUTANEOUS
  Administered 2019-07-01: 5 [IU] via SUBCUTANEOUS
  Administered 2019-07-02: 2 [IU] via SUBCUTANEOUS
  Administered 2019-07-02: 5 [IU] via SUBCUTANEOUS
  Administered 2019-07-02: 8 [IU] via SUBCUTANEOUS
  Administered 2019-07-03: 5 [IU] via SUBCUTANEOUS
  Administered 2019-07-03 – 2019-07-04 (×2): 3 [IU] via SUBCUTANEOUS
  Filled 2019-06-29: qty 0.15

## 2019-06-29 MED ORDER — APIXABAN 5 MG PO TABS
5.0000 mg | ORAL_TABLET | Freq: Two times a day (BID) | ORAL | Status: DC
  Administered 2019-06-29 – 2019-07-04 (×10): 5 mg via ORAL
  Filled 2019-06-29 (×12): qty 1

## 2019-06-29 MED ORDER — NITROGLYCERIN 0.4 MG SL SUBL
0.4000 mg | SUBLINGUAL_TABLET | SUBLINGUAL | Status: DC | PRN
  Administered 2019-07-02 (×2): 0.4 mg via SUBLINGUAL
  Filled 2019-06-29 (×2): qty 1

## 2019-06-29 MED ORDER — FENTANYL CITRATE (PF) 100 MCG/2ML IJ SOLN
100.0000 ug | Freq: Once | INTRAMUSCULAR | Status: AC
  Administered 2019-06-29: 10:00:00 100 ug via INTRAVENOUS
  Filled 2019-06-29: qty 2

## 2019-06-29 MED ORDER — DEXTROSE-NACL 5-0.45 % IV SOLN: INTRAVENOUS | Status: DC

## 2019-06-29 MED ORDER — POTASSIUM CHLORIDE CRYS ER 20 MEQ PO TBCR
40.0000 meq | EXTENDED_RELEASE_TABLET | ORAL | Status: AC
  Administered 2019-06-29: 18:00:00 40 meq via ORAL
  Filled 2019-06-29 (×2): qty 2

## 2019-06-29 MED ORDER — INSULIN GLARGINE 100 UNIT/ML ~~LOC~~ SOLN
40.0000 [IU] | SUBCUTANEOUS | Status: DC
  Administered 2019-06-29 – 2019-06-30 (×2): 40 [IU] via SUBCUTANEOUS
  Filled 2019-06-29 (×3): qty 0.4

## 2019-06-29 MED ORDER — METOCLOPRAMIDE HCL 5 MG/ML IJ SOLN
10.0000 mg | Freq: Three times a day (TID) | INTRAMUSCULAR | Status: DC
  Administered 2019-06-29 – 2019-06-30 (×3): 10 mg via INTRAVENOUS
  Filled 2019-06-29 (×3): qty 2

## 2019-06-29 MED ORDER — SODIUM CHLORIDE 0.9 % IV SOLN: INTRAVENOUS | Status: DC

## 2019-06-29 MED ORDER — SODIUM CHLORIDE 0.9% FLUSH
3.0000 mL | Freq: Once | INTRAVENOUS | Status: AC
  Administered 2019-06-29: 09:00:00 3 mL via INTRAVENOUS

## 2019-06-29 MED ORDER — DEXTROSE 50 % IV SOLN: 0.0000 mL | INTRAVENOUS | Status: DC | PRN

## 2019-06-29 MED ORDER — PANTOPRAZOLE SODIUM 40 MG IV SOLR
40.0000 mg | Freq: Two times a day (BID) | INTRAVENOUS | Status: DC
  Administered 2019-06-29 – 2019-07-04 (×11): 40 mg via INTRAVENOUS
  Filled 2019-06-29 (×11): qty 40

## 2019-06-29 MED ORDER — AMLODIPINE BESYLATE 10 MG PO TABS
10.0000 mg | ORAL_TABLET | Freq: Every morning | ORAL | Status: DC
  Administered 2019-06-30 – 2019-07-02 (×2): 10 mg via ORAL
  Filled 2019-06-29 (×4): qty 1
  Filled 2019-06-29: qty 2
  Filled 2019-06-29: qty 1

## 2019-06-29 MED ORDER — METOPROLOL SUCCINATE ER 25 MG PO TB24
25.0000 mg | ORAL_TABLET | Freq: Every day | ORAL | Status: DC
  Administered 2019-06-30 – 2019-07-04 (×5): 25 mg via ORAL
  Filled 2019-06-29 (×5): qty 1

## 2019-06-29 MED ORDER — MAGNESIUM SULFATE 2 GM/50ML IV SOLN
2.0000 g | Freq: Once | INTRAVENOUS | Status: AC
  Administered 2019-06-29: 12:00:00 2 g via INTRAVENOUS
  Filled 2019-06-29: qty 50

## 2019-06-29 MED ORDER — ACETAMINOPHEN 650 MG RE SUPP: 650.0000 mg | Freq: Four times a day (QID) | RECTAL | Status: DC | PRN

## 2019-06-29 NOTE — ED Notes (Signed)
Pt transported to xray 

## 2019-06-29 NOTE — ED Notes (Signed)
Date and time results received: 06/29/19 1118  Test: Lactic Acid Critical Value: 4.5  Name of Provider Notified: Regenia Skeeter MD

## 2019-06-29 NOTE — ED Triage Notes (Signed)
Patient c/o chest pain, SOB, diaphoresis, and weeping of her lower extremities x 1 hour ago.

## 2019-06-29 NOTE — ED Notes (Signed)
Patient made aware we need UA. Patient will call out when ready to void. Patient refusing pure wick at this time.

## 2019-06-29 NOTE — ED Notes (Addendum)
Lactated Ringers bolus in Left AC.   Potassium and Normal Saline in Right Upper Arm.   Insulin Regular in Right AC. (compatible with potasium per micromedex)

## 2019-06-29 NOTE — H&P (Addendum)
History and Physical    Deborah [name]:[id number] DOB: November 01, 1965 DOA: 06/29/2019  PCP: [name], DO   Patient coming from: Home  I have personally briefly reviewed patient's old medical records in [place]  Chief Complaint: Abdominal pain and vomiting  HPI: Deborah [name] is a 54 y.o. female with medical history significant of diabetes mellitus type II, gastroparesis, recent recurrent admissions for DKA with last admission on 06/22/2019 and discharged on 8/76/8115, chronic systolic heart failure, paroxysmal A. fib on Eliquis, chronic kidney disease stage IIIb presented with sudden onset of nausea, vomiting and abdominal pain that started an hour prior to presentation to the ED.  Patient states that she has vomited 6 times already, nonbilious nonbloody associated with upper abdominal pain, sharp in nature, 6-8 out of 10 in intensity, with no radiation.  No fever, worsening shortness of breath.  Also complains of some lower chest pain with abdominal pain.  She states that her legs are more swollen than normal.  She is on torsemide at home and took her last dose last night.  She also took her long-acting insulin last night.  She states that she is compliant with her insulin.  No loss of consciousness, seizures, blurring of vision.  ED Course: She was found to have hyperglycemia with anion gap of 19.  She was treated with IV fluids and insulin drip.  Hospital service was called to evaluate the patient.  Review of Systems: As per HPI otherwise all other  systems were reviewed and are negative.   Past Medical History:  Diagnosis Date  . Acute back pain with sciatica, left   . Acute back pain with sciatica, right   . Acute on chronic congestive heart failure ([place])   . AKI (acute kidney injury) ([place])   . Anemia, unspecified   . Chest pain 12/2015  . Chronic pain   . Chronic systolic CHF (congestive heart failure) ([place])   . Cough   . Diabetes mellitus   .  Esophageal reflux   . Fibromyalgia   . Flu-like symptoms 04/05/2019  . Gastric ulcer   . Gastroparesis   . Gout   . Hyperlipidemia   . Hypertension   . Hypokalemia   . Hypomagnesemia   . Lumbosacral stenosis   . NICM (nonischemic cardiomyopathy) ([employer])   . Obesity   . PAF (paroxysmal atrial fibrillation) ([place])   . Pneumonia   . Stroke ([place]) 02/2011  . Vitamin B12 deficiency anemia     Past Surgical History:  Procedure Laterality Date  . CATARACT EXTRACTION  01/2014  . CHOLECYSTECTOMY     Social history  reports that she has never smoked. She has never used smokeless tobacco. She reports that she does not drink alcohol or use drugs.  Allergies  Allergen Reactions  . Contrast Media [Iodinated Diagnostic Agents] Anaphylaxis    Cardiac arrest  . Diazepam [name]  . Isovue [Iopamidol] Anaphylaxis    Patient had seizure like activity and then code post 100 cc of isovue 300  . Lisinopril Anaphylaxis    Tongue and mouth swelling  . Penicillins Palpitations    Has patient had a PCN reaction causing immediate rash, facial/tongue/throat swelling, SOB or lightheadedness with hypotension: Yes, heart races Has patient had a PCN reaction causing severe rash involving mucus membranes or skin necrosis: No Has patient had a PCN reaction that required hospitalization: Yes  Has patient had a PCN reaction occurring within the last 10 years: No   .  Acetaminophen Nausea Only and Other (See Comments)    Irritates stomach ulcer  Abdominal pain  . Tolmetin Nausea Only and Other (See Comments)    ULCER  . Aspirin Other (See Comments)    Irritates stomach ulcer   . Food Hives and Swelling    Reaction to Carrots, ketchup   . Nsaids Other (See Comments)    ULCER  . Tramadol Nausea And Vomiting    Family History  Problem Relation Age of Onset  . Diabetes Mother   . Diabetes Father   . Heart disease Father   . Diabetes Sister   . Congestive Heart Failure Sister 68  . Diabetes  Brother     Prior to Admission medications   Medication Sig Start Date End Date Taking? Authorizing Provider  albuterol (PROVENTIL) (2.5 MG/3ML) 0.083% nebulizer solution Take 3 mLs (2.5 mg total) by nebulization every 6 (six) hours as needed for wheezing or shortness of breath. 04/06/19  Yes Scot Jun, FNP  amLODipine (NORVASC) 10 MG tablet Take 10 mg by mouth every morning. 04/19/19  Yes [provider]  apixaban (ELIQUIS) 5 MG TABS tablet Take 1 tablet (5 mg total) by mouth 2 (two) times daily. 05/09/19  Yes Gifford Shave, MD  atorvastatin (LIPITOR) 10 MG tablet Take 1 tablet (10 mg total) by mouth daily. 12/13/18  Yes Lockamy, Christia [place], DO  cetirizine (ZYRTEC) 10 MG tablet Take 1 tablet (10 mg total) by mouth daily. 12/13/18  Yes Lockamy, Timothy, DO  DULoxetine HCl 40 MG CPEP TAKE ONE CAPSULE BY MOUTH TWICE DAILY Patient taking differently: Take 40 mg by mouth in the morning and at bedtime.  06/04/19  Yes Lockamy, Timothy, DO  fluticasone (FLONASE) 50 MCG/ACT nasal spray Place 2 sprays into both nostrils daily as needed for allergies or rhinitis. 12/19/18  Yes Rai, Ripudeep K, MD  hydrALAZINE (APRESOLINE) 25 MG tablet TAKE ONE TABLET BY MOUTH THREE TIMES A DAY. FOR BLOOD PRESSURE Patient taking differently: Take 25 mg by mouth 3 (three) times daily. For blood pressure 04/19/19  Yes Lockamy, Timothy, DO  insulin aspart (NOVOLOG FLEXPEN) 100 UNIT/ML FlexPen Inject 15 Units into the skin 3 (three) times daily with meals. Patient taking differently: Inject 30 Units into the skin 3 (three) times daily with meals.  06/26/19  Yes Mariel Aloe, MD  LANTUS SOLOSTAR 100 UNIT/ML Solostar Pen Inject 45 Units into the skin at bedtime. 06/14/19  Yes Pokhrel, Laxman, MD  loperamide (IMODIUM) 2 MG capsule Take 2 mg by mouth 4 (four) times daily as needed for diarrhea or loose stools.   Yes [provider]  magnesium oxide (MAG-OX) 400 (241.3 Mg) MG tablet Take 1 tablet (400 mg total) by  mouth 2 (two) times daily. 06/14/19  Yes Pokhrel, Laxman, MD  Melatonin 3 MG TABS Take 2 tablets (6 mg total) by mouth at bedtime. 12/13/18  Yes Lockamy, Timothy, DO  metoCLOPramide (REGLAN) 10 MG tablet Take 1 tablet (10 mg total) by mouth 3 (three) times daily before meals. 12/19/18  Yes Rai, Ripudeep K, MD  metoprolol succinate (TOPROL-XL) 25 MG 24 hr tablet Take 25 mg by mouth daily. 06/01/19  Yes [provider]  nitroGLYCERIN (NITROSTAT) 0.4 MG SL tablet Place 1 tablet (0.4 mg total) under the tongue every 5 (five) minutes as needed for chest pain. 12/13/18  Yes Lockamy, Timothy, DO  ondansetron (ZOFRAN-ODT) 4 MG disintegrating tablet Take 4 mg by mouth every 8 (eight) hours as needed for nausea or vomiting.  06/07/19  Yes [provider]  pantoprazole (PROTONIX) 40 MG tablet Take 1 tablet (40 mg total) by mouth 2 (two) times daily. 03/17/19 06/29/19 Yes Welborn, Ryan, DO  torsemide (DEMADEX) 20 MG tablet Take 2 tablets (40 mg total) by mouth daily. Hold if having significant vomiting 06/26/19  Yes Mariel Aloe, MD  blood glucose meter kit and supplies KIT Dispense based on patient and insurance preference. Use up to four times daily as directed. (FOR ICD-9 250.00, 250.01). 12/13/18   [name], DO  dicyclomine (BENTYL) 20 MG tablet TAKE 1 TABLET (20 MG TOTAL) BY MOUTH 3 (THREE) TIMES DAILY AS NEEDED FOR SPASMS (ABDOMINAL CRAMPING). Patient not taking: Reported on 06/29/2019 06/22/19 07/22/19  [name], DO    Physical Exam: Vitals:   06/29/19 1100 06/29/19 1130 06/29/19 1200 06/29/19 1230  BP: (!) 137/91 122/67 122/67 (!) 130/53  Pulse: (!) 113 (!) 109 (!) 108 (!) 107  Resp: 20 (!) 25 (!) 26 (!) 27  Temp:      TempSrc:      SpO2: 99% 97% 96% 93%  Weight:      Height:        Constitutional: NAD.  Looks chronically ill.  Poor historian.  Looks older than stated age. Vitals:   06/29/19 1100 06/29/19 1130 06/29/19 1200 06/29/19 1230  BP: (!) 137/91 122/67 122/67  (!) 130/53  Pulse: (!) 113 (!) 109 (!) 108 (!) 107  Resp: 20 (!) 25 (!) 26 (!) 27  Temp:      TempSrc:      SpO2: 99% 97% 96% 93%  Weight:      Height:       Eyes: PERRL, lids and conjunctivae normal ENMT: Mucous membranes are dry. Posterior pharynx clear of any exudate or lesions. Neck: normal, supple, no masses, no thyromegaly Respiratory: bilateral decreased breath sounds at bases, no wheezing, no crackles.  Tachypneic. No accessory muscle use.  Cardiovascular: S1 S2 positive, tachycardic.  Trace bilateral lower extremity edema present. 2+ pedal pulses.  Abdomen: Morbidly obese, mild upper abdominal tenderness present, no masses palpated. No hepatosplenomegaly. Bowel sounds positive.  Musculoskeletal: no clubbing / cyanosis. No joint deformity upper and lower extremities.  Skin: no rashes, lesions, ulcers. No induration Neurologic: CN 2-12 grossly intact. Moving extremities. No focal neurologic deficits.  Psychiatric: Alert and awake.  Flat affect.    Labs on Admission: I have personally reviewed following labs and imaging studies  CBC: Recent Labs  Lab 06/22/19 2123 06/29/19 0928  WBC 14.5* 13.9*  NEUTROABS  --  11.2*  HGB 10.7* 10.6*  HCT 34.0* 33.5*  MCV 85.9 84.4  PLT 394 902*   Basic Metabolic Panel: Recent Labs  Lab 06/24/19 0916 06/24/19 1600 06/25/19 0136 06/26/19 1046 06/29/19 0928  NA 136 136 135 138 138  K 3.6 3.9 3.6 3.7 3.0*  CL 106 104 106 108 97*  CO2 22 21* 21* 21* 22  GLUCOSE 218* 316* 218* 259* 433*  BUN 33* 34* 39* 31* 26*  CREATININE 1.60* 1.83* 1.63* 1.29* 1.47*  CALCIUM 8.2* 8.2* 8.3* 8.8* 9.2  MG  --   --   --   --  0.9*   GFR: Estimated Creatinine Clearance: 65.8 mL/min (A) (by C-G formula based on SCr of 1.47 mg/dL (H)). Liver Function Tests: Recent Labs  Lab 06/22/19 2123 06/29/19 0928  AST 21 19  ALT 19 16  ALKPHOS 101 122  BILITOT 0.9 0.8  PROT 7.6 8.0  ALBUMIN 3.4* 3.5   Recent  Labs  Lab 06/22/19 2123 06/29/19 0928    LIPASE 22 39   No results for input(s): AMMONIA in the last 168 hours. Coagulation Profile: No results for input(s): INR, PROTIME in the last 168 hours. Cardiac Enzymes: No results for input(s): CKTOTAL, CKMB, CKMBINDEX, TROPONINI in the last 168 hours. BNP (last 3 results) No results for input(s): PROBNP in the last 8760 hours. HbA1C: No results for input(s): HGBA1C in the last 72 hours. CBG: Recent Labs  Lab 06/25/19 2129 06/26/19 0714 06/26/19 1147 06/29/19 0917 06/29/19 1232  GLUCAP 245* 186* 232* 383* 393*   Lipid Profile: No results for input(s): CHOL, HDL, LDLCALC, TRIG, CHOLHDL, LDLDIRECT in the last 72 hours. Thyroid Function Tests: No results for input(s): TSH, T4TOTAL, FREET4, T3FREE, THYROIDAB in the last 72 hours. Anemia Panel: No results for input(s): VITAMINB12, FOLATE, FERRITIN, TIBC, IRON, RETICCTPCT in the last 72 hours. Urine analysis:    Component Value Date/Time   COLORURINE YELLOW 06/22/2019 1952   APPEARANCEUR CLEAR 06/22/2019 1952   LABSPEC 1.012 06/22/2019 1952   PHURINE 6.0 06/22/2019 [place] 06/22/2019 [place] NEGATIVE 06/22/2019 [place] NEGATIVE 06/22/2019 [place] NEGATIVE 06/22/2019 1952   PROTEINUR >=300 (A) 06/22/2019 1952   UROBILINOGEN 0.2 10/02/2013 2108   NITRITE NEGATIVE 06/22/2019 1952   LEUKOCYTESUR NEGATIVE 06/22/2019 1952    Radiological Exams on Admission: DG Chest 2 View  Result Date: 06/29/2019 CLINICAL DATA:  Chest pain and shortness of breath. Diaphoresis. EXAM: CHEST - 2 VIEW COMPARISON:  06/22/2019 FINDINGS: Borderline enlarged cardiac silhouette. Clear lungs with normal vascularity. Thoracic spine degenerative changes. IMPRESSION: Borderline cardiomegaly. No acute abnormality. Electronically Signed   By: Claudie Revering M.D.   On: 06/29/2019 10:00    EKG: Independently reviewed.  Sinus tachycardia.  No ST elevations or depressions.  Assessment/Plan  DKA in a patient with  uncontrolled diabetes mellitus type 2 Diabetic gastroparesis -Patient presented with abdominal pain with nausea and vomiting with hyperglycemia and anion gap of 19 -Has received IV fluids in the ED and is being started on insulin drip.  Continue insulin drip as per protocol.  Continue monitoring CBGs and switch to D5 half-normal saline once blood sugars are less than 250 -Continue frequent BMP monitoring. -N.p.o. for now -Diabetes coronary consult -Continue Reglan and as needed Zofran. -Might need outpatient GI evaluation for gastroparesis  Leukocytosis -Most likely reactive from above.  Monitor  Hypokalemia -Replace.  Monitor frequent BMP  Chronic systolic CHF -No signs of volume overload at this point.  Strict input and output.  Daily weights.  Torsemide on hold for now.  Outpatient follow-up with cardiology.  Continue Toprol and hydralazine  Paroxysmal A. Fib -Continue Eliquis.  Continue Toprol once able to take orally.  Essential hypertension -Monitor blood pressure.  Resume oral antihypertensive once able to take orally  GERD - will use IV Protonix for now  Hyperlipidemia -Continue Lipitor once able to take orally   DVT prophylaxis: Eliquis Code Status: Full Family Communication: Spoke to patient at bedside Disposition Plan: Home in 1 to 2 days once clinically improves and DKA resolved and able to take orally Consults called: None Admission status: Stepdown/observation  Severity of Illness: The appropriate patient status for this patient is OBSERVATION. Observation status is judged to be reasonable and necessary in order to provide the required intensity of service to ensure the patient's safety. The patient's presenting symptoms, physical exam findings, and initial radiographic and laboratory data in the context  of their medical condition is felt to place them at decreased risk for further clinical deterioration. Furthermore, it is anticipated that the patient will be  medically stable for discharge from the hospital within 2 midnights of admission. The following factors support the patient status of observation.   " The patient's presenting symptoms include nausea/vomiting/abdominal pain. " The physical exam findings include dry oral mucous membrane/tachycardia/tachypnea " The initial radiographic and laboratory data are DKA.      Aline August MD [facility]  06/29/2019, 12:42 PM

## 2019-06-29 NOTE — ED Notes (Signed)
Liquid meal tray placed at bedside. Patient reports she is not hungry but she is nauseous. Patient given PRN nausea medication.

## 2019-06-29 NOTE — ED Notes (Signed)
Patient called out to have BM. Patient placed on bedside commode. Pt requested RN step out for privacy. Toilet paper and call bell at bedside. Patient advised to use call bell when finished so this RN can assist patient back to bed.

## 2019-06-29 NOTE — ED Notes (Addendum)
Starla Link, MD paged and made aware of CBG and gap.   Also patient would like dilaudid instead of morphine.   Per MD will not switch PRN pain medication due to gastroparesis.

## 2019-06-29 NOTE — ED Notes (Signed)
Starla Link, MD made aware of new BMP and low potassium.

## 2019-06-29 NOTE — Progress Notes (Signed)
Inpatient Diabetes Program Recommendations  AACE/ADA: New Consensus Statement on Inpatient Glycemic Control (2015)  Target Ranges:  Prepandial:   less than 140 mg/dL      Peak postprandial:   less than 180 mg/dL (1-2 hours)      Critically ill patients:  140 - 180 mg/dL   Lab Results  Component Value Date   GLUCAP 345 (H) 06/29/2019   HGBA1C 8.3 (H) 05/07/2019    Review of Glycemic Control  Diabetes history: DM Outpatient Diabetes medications: Lantus 45 units at HS, Novolog 30 units TID Current orders for Inpatient glycemic control: IV insulin  Inpatient Diabetes Program Recommendations:   Received diabetes coordinator consult. Inpatient diabetes team very familiar with this patient. Has been seen by our team on frequent admissions. TOC met with patient on 4/19 about home situation.   Spoke with ED staff RN about patient's condition. Patient is being transferred to stepdown unit and remains on IV insulin. Recommend transitioning to Lantus 40 units daily, Novolog MODERATE correction scale every 4 hours if NPO and then TID & HS. Titrate dosages as needed.   Will continue to monitor blood sugars while in the hospital.  Harvel Ricks RN BSN CDE Diabetes Coordinator Pager: [phone]  8am-5pm

## 2019-06-29 NOTE — ED Notes (Signed)
Bedside commode placed in room for patient. Patient made aware we need urine sample.

## 2019-06-29 NOTE — ED Provider Notes (Signed)
Deborah [name] DEPT Provider Note   [name]: [id number] Arrival date & time: 06/29/19  3354     History Chief Complaint  Patient presents with  . [name]  . Chest Pain  . Hyperglycemia    Deborah [name] is a 54 y.o. female.  HPI 54 year old female presents with chest pain, abdominal pain, vomiting.  She states this started 1 hour ago.  Tells me it feels like her prior gastroparesis.  She has vomited multiple times.  She also endorses that her legs seem more swollen than typical.  Patient has been admitted twice this month for DKA with type 2 diabetes. Pain is severe.   Past Medical History:  Diagnosis Date  . Acute back pain with sciatica, left   . Acute back pain with sciatica, right   . Acute on chronic congestive heart failure ([place])   . AKI (acute kidney injury) ([place])   . Anemia, unspecified   . Chest pain 12/2015  . Chronic pain   . Chronic systolic CHF (congestive heart failure) ([place])   . Cough   . Diabetes mellitus   . Esophageal reflux   . Fibromyalgia   . Flu-like symptoms 04/05/2019  . Gastric ulcer   . Gastroparesis   . Gout   . Hyperlipidemia   . Hypertension   . Hypokalemia   . Hypomagnesemia   . Lumbosacral stenosis   . NICM (nonischemic cardiomyopathy) ([place])   . Obesity   . PAF (paroxysmal atrial fibrillation) ([place])   . Pneumonia   . Stroke ([place]) 02/2011  . Vitamin B12 deficiency anemia     Patient Active Problem List   Diagnosis Date Noted  . Nonalcoholic fatty liver disease 06/05/2019  . Chronic diarrhea   . COPD exacerbation ([place]) 05/08/2019  . Acute on chronic HFrEF (heart failure with reduced ejection fraction) ([place])   . High risk social situation   . Restrictive lung disease secondary to obesity   . Physical deconditioning   . [name] 05/07/2019  . History of gastric ulcer   . Uncontrolled type 2 diabetes mellitus with hyperglycemia ([place])   . Fibroid uterus 02/23/2019  .  Congestion of nasal sinus 01/24/2019  . Chronic systolic CHF (congestive heart failure) ([place]) 12/19/2018  . Intractable nausea and vomiting 12/17/2018  . Hypoxia 12/17/2018  . Hyperglycemia 12/17/2018  . Elevated troponin I level   . Vomiting 07/18/2018  . Abdominal pain 07/17/2018  . Hyperkalemia 07/17/2018  . Cardiac arrest ([place]) 11/29/2017  . Pelvic mass 11/29/2017  . Leukocytosis 11/29/2017  . Anxiety 11/29/2017  . Allergic reaction to contrast media, initial encounter 11/28/2017  . Palliative care encounter   . Back pain 03/19/2017  . Stroke (cerebrum) ([place]) 03/19/2017  . GERD (gastroesophageal reflux disease) 03/19/2017  . Depression 03/19/2017  . Morbid obesity ([place])   . Urinary tract infection 08/16/2016  . Normocytic normochromic anemia 08/16/2016  . Gastroparesis 08/16/2016  . Intractable vomiting with nausea 06/17/2016  . Diabetic gastroparesis ([place]) 06/05/2016  . Gout 06/05/2016  . AKI (acute kidney injury) ([place]) 12/06/2015  . Chest pain 09/26/2015  . Hypokalemia 09/26/2015  . Hypomagnesemia 09/26/2015  . Nausea and vomiting 08/20/2015  . Gout flare 05/27/2015  . Lower abdominal pain 05/26/2015  . DKA (diabetic ketoacidoses) ([place]) 05/25/2015  . Uncontrolled type 2 diabetes mellitus with diabetic neuropathy, with long-term current use of insulin ([place]) 05/25/2015  . Type 2 diabetes mellitus with hyperglycemia, with long-term current use of insulin ([place]) 05/25/2015  .  CKD (chronic kidney disease), stage II 05/25/2015  . Essential hypertension, benign 09/28/2013    Past Surgical History:  Procedure Laterality Date  . CATARACT EXTRACTION  01/2014  . CHOLECYSTECTOMY       OB History   No obstetric history on file.     Family History  Problem Relation Age of Onset  . Diabetes Mother   . Diabetes Father   . Heart disease Father   . Diabetes Sister   . Congestive Heart Failure Sister 51  . Diabetes Brother     Social History   Tobacco Use  . Smoking  status: Never Smoker  . Smokeless tobacco: Never Used  Substance Use Topics  . Alcohol use: No  . Drug use: No    Home Medications Prior to Admission medications   Medication Sig Start Date End Date Taking? Authorizing Provider  albuterol (PROVENTIL) (2.5 MG/3ML) 0.083% nebulizer solution Take 3 mLs (2.5 mg total) by nebulization every 6 (six) hours as needed for wheezing or shortness of breath. 04/06/19   Scot Jun, FNP  amLODipine (NORVASC) 10 MG tablet Take 10 mg by mouth every morning. 04/19/19   [provider]  apixaban (ELIQUIS) 5 MG TABS tablet Take 1 tablet (5 mg total) by mouth 2 (two) times daily. 05/09/19   Gifford Shave, MD  aspirin EC 81 MG tablet Take 81 mg by mouth daily.    [provider]  atorvastatin (LIPITOR) 10 MG tablet Take 1 tablet (10 mg total) by mouth daily. 12/13/18   [name], DO  blood glucose meter kit and supplies KIT Dispense based on patient and insurance preference. Use up to four times daily as directed. (FOR ICD-9 250.00, 250.01). 12/13/18   [name], DO  cetirizine (ZYRTEC) 10 MG tablet Take 1 tablet (10 mg total) by mouth daily. 12/13/18   [name], DO  dicyclomine (BENTYL) 20 MG tablet TAKE 1 TABLET (20 MG TOTAL) BY MOUTH 3 (THREE) TIMES DAILY AS NEEDED FOR SPASMS (ABDOMINAL CRAMPING). 06/22/19 07/22/19  [name], DO  DULoxetine HCl 40 MG CPEP TAKE ONE CAPSULE BY MOUTH TWICE DAILY 06/04/19   Lockamy, Timothy, DO  fluticasone (FLONASE) 50 MCG/ACT nasal spray Place 2 sprays into both nostrils daily as needed for allergies or rhinitis. 12/19/18   Rai, Ripudeep K, MD  hydrALAZINE (APRESOLINE) 25 MG tablet TAKE ONE TABLET BY MOUTH THREE TIMES A DAY. FOR BLOOD PRESSURE Patient taking differently: Take 25 mg by mouth 3 (three) times daily. For blood pressure 04/19/19   Lockamy, Timothy, DO  insulin aspart (NOVOLOG FLEXPEN) 100 UNIT/ML FlexPen Inject 15 Units into the skin 3 (three) times daily with meals.  06/26/19   Mariel Aloe, MD  LANTUS SOLOSTAR 100 UNIT/ML Solostar Pen Inject 45 Units into the skin at bedtime. 06/14/19   Pokhrel, Corrie Mckusick, MD  loperamide (IMODIUM) 2 MG capsule Take 2 mg by mouth 4 (four) times daily as needed for diarrhea or loose stools.    [provider]  magnesium oxide (MAG-OX) 400 (241.3 Mg) MG tablet Take 1 tablet (400 mg total) by mouth 2 (two) times daily. 06/14/19   Pokhrel, Corrie Mckusick, MD  Melatonin 3 MG TABS Take 2 tablets (6 mg total) by mouth at bedtime. 12/13/18   [name], DO  metoCLOPramide (REGLAN) 10 MG tablet Take 1 tablet (10 mg total) by mouth 3 (three) times daily before meals. 12/19/18   Rai, Vernelle Emerald, MD  metoprolol succinate (TOPROL-XL) 25 MG 24 hr tablet Take 25 mg by  mouth daily. 06/01/19   [provider]  nitroGLYCERIN (NITROSTAT) 0.4 MG SL tablet Place 1 tablet (0.4 mg total) under the tongue every 5 (five) minutes as needed for chest pain. 12/13/18   Lockamy, Christia Reading, DO  ondansetron (ZOFRAN-ODT) 4 MG disintegrating tablet Take 4 mg by mouth every 8 (eight) hours as needed. 06/07/19   [provider]  pantoprazole (PROTONIX) 40 MG tablet Take 1 tablet (40 mg total) by mouth 2 (two) times daily. 03/17/19 06/22/19  Lurline Del, DO  torsemide (DEMADEX) 20 MG tablet Take 2 tablets (40 mg total) by mouth daily. Hold if having significant vomiting 06/26/19   Mariel Aloe, MD    Allergies    Contrast media [iodinated diagnostic agents], Diazepam, Isovue [iopamidol], Lisinopril, Penicillins, Acetaminophen, Tolmetin, Aspirin, Food, Nsaids, and Tramadol  Review of Systems   Review of Systems  Constitutional: Negative for fever.  Respiratory: Positive for shortness of breath.   Cardiovascular: Positive for chest pain and leg swelling.  Gastrointestinal: Positive for abdominal pain and vomiting. Negative for diarrhea.  All other systems reviewed and are negative.   Physical Exam Updated Vital Signs BP (!) 153/93   Pulse (!)  117   Temp 97.7 F (36.5 C) (Oral)   Resp (!) 25   Ht _0  (1.676 m)   Wt (!) 152 kg   LMP 10/10/2012   SpO2 100%   BMI 54.07 kg/m   Physical Exam Vitals and nursing note reviewed.  Constitutional:      General: She is in acute distress.     Appearance: She is well-developed. She is obese. She is not diaphoretic.  HENT:     Head: Normocephalic and atraumatic.     Right Ear: External ear normal.     Left Ear: External ear normal.     Nose: Nose normal.  Eyes:     General:        Right eye: No discharge.        Left eye: No discharge.  Cardiovascular:     Rate and Rhythm: Regular rhythm. Tachycardia present.     Heart sounds: Normal heart sounds.  Pulmonary:     Effort: Pulmonary effort is normal.     Breath sounds: Normal breath sounds.  Abdominal:     Palpations: Abdomen is soft.     Tenderness: There is generalized abdominal tenderness.  Musculoskeletal:     Right lower leg: No edema.     Left lower leg: No edema.  Skin:    General: Skin is warm and dry.  Neurological:     Mental Status: She is alert.  Psychiatric:        Mood and Affect: Mood is not anxious.     ED Results / Procedures / Treatments   Labs (all labs ordered are listed, but only abnormal results are displayed) Labs Reviewed  BASIC METABOLIC PANEL - Abnormal; Notable for the following components:      Result Value   Potassium 3.0 (*)    Chloride 97 (*)    Glucose, Bld 433 (*)    BUN 26 (*)    Creatinine, Ser 1.47 (*)    GFR calc non Af Amer 40 (*)    GFR calc Af Amer 46 (*)    Anion gap 19 (*)    All other components within normal limits  BLOOD GAS, VENOUS - Abnormal; Notable for the following components:   pH, Ven 7.557 (*)    pCO2, Ven 25.4 (*)  All other components within normal limits  URINALYSIS, ROUTINE W REFLEX MICROSCOPIC - Abnormal; Notable for the following components:   Color, Urine STRAW (*)    Glucose, UA >=500 (*)    Hgb urine dipstick SMALL (*)    Protein, ur 100  (*)    All other components within normal limits  CBC WITH DIFFERENTIAL/PLATELET - Abnormal; Notable for the following components:   WBC 13.9 (*)    Hemoglobin 10.6 (*)    HCT 33.5 (*)    Platelets 427 (*)    Neutro Abs 11.2 (*)    Abs Immature Granulocytes 0.08 (*)    All other components within normal limits  BETA-HYDROXYBUTYRIC ACID - Abnormal; Notable for the following components:   Beta-Hydroxybutyric Acid 0.64 (*)    All other components within normal limits  LACTIC ACID, PLASMA - Abnormal; Notable for the following components:   Lactic Acid, Venous 4.5 (*)    All other components within normal limits  MAGNESIUM - Abnormal; Notable for the following components:   Magnesium 0.9 (*)    All other components within normal limits  BASIC METABOLIC PANEL - Abnormal; Notable for the following components:   Potassium 3.3 (*)    Glucose, Bld 390 (*)    BUN 26 (*)    Creatinine, Ser 1.50 (*)    Calcium 8.6 (*)    GFR calc non Af Amer 39 (*)    GFR calc Af Amer 45 (*)    All other components within normal limits  CBG MONITORING, ED - Abnormal; Notable for the following components:   Glucose-Capillary 383 (*)    All other components within normal limits  CBG MONITORING, ED - Abnormal; Notable for the following components:   Glucose-Capillary 393 (*)    All other components within normal limits  CBG MONITORING, ED - Abnormal; Notable for the following components:   Glucose-Capillary 345 (*)    All other components within normal limits  TROPONIN I (HIGH SENSITIVITY) - Abnormal; Notable for the following components:   Troponin I (High Sensitivity) 28 (*)    All other components within normal limits  TROPONIN I (HIGH SENSITIVITY) - Abnormal; Notable for the following components:   Troponin I (High Sensitivity) 24 (*)    All other components within normal limits  RESPIRATORY PANEL BY RT PCR (FLU A&B, COVID)  HEPATIC FUNCTION PANEL  LIPASE, BLOOD  LACTIC ACID, PLASMA  BASIC  METABOLIC PANEL  BASIC METABOLIC PANEL  BETA-HYDROXYBUTYRIC ACID  BETA-HYDROXYBUTYRIC ACID  BASIC METABOLIC PANEL    EKG EKG Interpretation  Date/Time:  Friday June 29 2019 09:44:11 EDT Ventricular Rate:  122 PR Interval:    QRS Duration: 89 QT Interval:  342 QTC Calculation: 488 R Axis:   -25 Text Interpretation: Sinus tachycardia Ventricular premature complex Left ventricular hypertrophy Borderline prolonged QT interval similar to earlier in the day and June 22 2019 Confirmed by Sherwood Gambler [phone]) on 06/29/2019 9:56:21 AM   Radiology DG Chest 2 View  Result Date: 06/29/2019 CLINICAL DATA:  Chest pain and shortness of breath. Diaphoresis. EXAM: CHEST - 2 VIEW COMPARISON:  06/22/2019 FINDINGS: Borderline enlarged cardiac silhouette. Clear lungs with normal vascularity. Thoracic spine degenerative changes. IMPRESSION: Borderline cardiomegaly. No acute abnormality. Electronically Signed   By: Claudie Revering M.D.   On: 06/29/2019 10:00    Procedures .Critical Care Performed by: Sherwood Gambler, MD Authorized by: Sherwood Gambler, MD   Critical care provider statement:    Critical care time (minutes):  45  Critical care time was exclusive of:  Separately billable procedures and treating other patients   Critical care was necessary to treat or prevent imminent or life-threatening deterioration of the following conditions:  Metabolic crisis and endocrine crisis   Critical care was time spent personally by me on the following activities:  Discussions with consultants, evaluation of patient's response to treatment, examination of patient, ordering and performing treatments and interventions, ordering and review of laboratory studies, ordering and review of radiographic studies, pulse oximetry, re-evaluation of patient's condition, obtaining history from patient or surrogate and review of old charts   (including critical care time)  Medications Ordered in ED Medications  insulin  regular, human (MYXREDLIN) 100 units/ 100 mL infusion (13 Units/hr Intravenous Rate/Dose Change 06/29/19 1342)  0.9 %  sodium chloride infusion ( Intravenous New Bag/Given 06/29/19 1202)  dextrose 5 %-0.45 % sodium chloride infusion ( Intravenous Hold 06/29/19 1156)  dextrose 50 % solution 0-50 mL (has no administration in time range)  potassium chloride 10 mEq in 100 mL IVPB (10 mEq Intravenous New Bag/Given 06/29/19 1329)  amLODipine (NORVASC) tablet 10 mg (has no administration in time range)  hydrALAZINE (APRESOLINE) tablet 25 mg (has no administration in time range)  metoprolol succinate (TOPROL-XL) 24 hr tablet 25 mg (has no administration in time range)  nitroGLYCERIN (NITROSTAT) SL tablet 0.4 mg (has no administration in time range)  DULoxetine (CYMBALTA) DR capsule 40 mg (has no administration in time range)  apixaban (ELIQUIS) tablet 5 mg (has no administration in time range)  albuterol (PROVENTIL) (2.5 MG/3ML) 0.083% nebulizer solution 2.5 mg (has no administration in time range)  fluticasone (FLONASE) 50 MCG/ACT nasal spray 2 spray (has no administration in time range)  acetaminophen (TYLENOL) tablet 650 mg (has no administration in time range)    Or  acetaminophen (TYLENOL) suppository 650 mg (has no administration in time range)  ondansetron (ZOFRAN) tablet 4 mg (has no administration in time range)    Or  ondansetron (ZOFRAN) injection 4 mg (has no administration in time range)  metoCLOPramide (REGLAN) injection 10 mg (10 mg Intravenous Given 06/29/19 1321)  morphine 2 MG/ML injection 2 mg (has no administration in time range)  pantoprazole (PROTONIX) injection 40 mg (40 mg Intravenous Given 06/29/19 1322)  sodium chloride flush (NS) 0.9 % injection 3 mL (3 mLs Intravenous Given 06/29/19 0927)  fentaNYL (SUBLIMAZE) injection 100 mcg (100 mcg Intravenous Given 06/29/19 0945)  metoCLOPramide (REGLAN) injection 10 mg (10 mg Intravenous Given 06/29/19 0942)  lactated ringers bolus 1,000 mL  (0 mLs Intravenous Stopped 06/29/19 1255)  potassium chloride 10 mEq in 100 mL IVPB (0 mEq Intravenous Stopped 06/29/19 1154)  HYDROmorphone (DILAUDID) injection 1 mg (1 mg Intravenous Given 06/29/19 1053)  ondansetron (ZOFRAN) injection 4 mg (4 mg Intravenous Given 06/29/19 1053)  magnesium sulfate IVPB 2 g 50 mL (0 g Intravenous Stopped 06/29/19 1233)  lactated ringers bolus 1,000 mL (1,000 mLs Intravenous New Bag/Given 06/29/19 1255)    ED Course  I have reviewed the triage vital signs and the nursing notes.  Pertinent labs & imaging results that were available during my care of the patient were reviewed by me and considered in my medical decision making (see chart for details).    MDM Rules/Calculators/A&P                      Patient appears to have recurrent gastroparesis.  Vomiting continues despite Reglan and Zofran.  She was also given IV pain control.  Given IV fluids.  Labs were personally reviewed and show anion gap metabolic acidosis.  Seems to be compensating with tachypnea.  Chest x-ray personally reviewed and is unremarkable.  My suspicion is she has a lactic acidosis from vomiting rather than sepsis.  Could be DKA as well.  Given IV fluids and supportive care.  Discussed with hospitalist for admission.  He asked for insulin drip.  Deborah [name] was evaluated in Emergency Department on 06/29/2019 for the symptoms described in the history of present illness. She was evaluated in the context of the global COVID-19 pandemic, which necessitated consideration that the patient might be at risk for infection with the SARS-CoV-2 virus that causes COVID-19. Institutional protocols and algorithms that pertain to the evaluation of patients at risk for COVID-19 are in a state of rapid change based on information released by regulatory bodies including the CDC and federal and state organizations. These policies and algorithms were followed during the patient's care in the ED.  Final Clinical  Impression(s) / ED Diagnoses Final diagnoses:  Diabetic ketoacidosis without coma associated with type 2 diabetes mellitus (HCC)  Lactic acidosis  Hypomagnesemia  Hypokalemia  Gastroparesis    Rx / DC Orders ED Discharge Orders    None       Sherwood Gambler, MD 06/29/19 1457

## 2019-06-29 NOTE — ED Notes (Signed)
Critical Value:Magnesium-0.9.

## 2019-06-30 DIAGNOSIS — Z888 Allergy status to other drugs, medicaments and biological substances status: Secondary | ICD-10-CM | POA: Diagnosis not present

## 2019-06-30 DIAGNOSIS — Z880 Allergy status to penicillin: Secondary | ICD-10-CM | POA: Diagnosis not present

## 2019-06-30 DIAGNOSIS — Z20822 Contact with and (suspected) exposure to covid-19: Secondary | ICD-10-CM | POA: Diagnosis present

## 2019-06-30 DIAGNOSIS — N1832 Chronic kidney disease, stage 3b: Secondary | ICD-10-CM | POA: Diagnosis present

## 2019-06-30 DIAGNOSIS — Z6841 Body Mass Index (BMI) 40.0 and over, adult: Secondary | ICD-10-CM | POA: Diagnosis not present

## 2019-06-30 DIAGNOSIS — Z7901 Long term (current) use of anticoagulants: Secondary | ICD-10-CM | POA: Diagnosis not present

## 2019-06-30 DIAGNOSIS — E1110 Type 2 diabetes mellitus with ketoacidosis without coma: Secondary | ICD-10-CM | POA: Diagnosis present

## 2019-06-30 DIAGNOSIS — N179 Acute kidney failure, unspecified: Secondary | ICD-10-CM | POA: Diagnosis present

## 2019-06-30 DIAGNOSIS — N182 Chronic kidney disease, stage 2 (mild): Secondary | ICD-10-CM | POA: Diagnosis not present

## 2019-06-30 DIAGNOSIS — F329 Major depressive disorder, single episode, unspecified: Secondary | ICD-10-CM | POA: Diagnosis present

## 2019-06-30 DIAGNOSIS — K3184 Gastroparesis: Secondary | ICD-10-CM | POA: Diagnosis present

## 2019-06-30 DIAGNOSIS — Z886 Allergy status to analgesic agent status: Secondary | ICD-10-CM | POA: Diagnosis not present

## 2019-06-30 DIAGNOSIS — I480 Paroxysmal atrial fibrillation: Secondary | ICD-10-CM | POA: Diagnosis present

## 2019-06-30 DIAGNOSIS — K219 Gastro-esophageal reflux disease without esophagitis: Secondary | ICD-10-CM | POA: Diagnosis present

## 2019-06-30 DIAGNOSIS — Z794 Long term (current) use of insulin: Secondary | ICD-10-CM | POA: Diagnosis not present

## 2019-06-30 DIAGNOSIS — D631 Anemia in chronic kidney disease: Secondary | ICD-10-CM | POA: Diagnosis present

## 2019-06-30 DIAGNOSIS — I5022 Chronic systolic (congestive) heart failure: Secondary | ICD-10-CM | POA: Diagnosis present

## 2019-06-30 DIAGNOSIS — E876 Hypokalemia: Secondary | ICD-10-CM | POA: Diagnosis present

## 2019-06-30 DIAGNOSIS — E1122 Type 2 diabetes mellitus with diabetic chronic kidney disease: Secondary | ICD-10-CM | POA: Diagnosis present

## 2019-06-30 DIAGNOSIS — I428 Other cardiomyopathies: Secondary | ICD-10-CM | POA: Diagnosis present

## 2019-06-30 DIAGNOSIS — R072 Precordial pain: Secondary | ICD-10-CM | POA: Diagnosis not present

## 2019-06-30 DIAGNOSIS — E1143 Type 2 diabetes mellitus with diabetic autonomic (poly)neuropathy: Secondary | ICD-10-CM | POA: Diagnosis present

## 2019-06-30 DIAGNOSIS — I130 Hypertensive heart and chronic kidney disease with heart failure and stage 1 through stage 4 chronic kidney disease, or unspecified chronic kidney disease: Secondary | ICD-10-CM | POA: Diagnosis present

## 2019-06-30 DIAGNOSIS — E872 Acidosis: Secondary | ICD-10-CM | POA: Diagnosis present

## 2019-06-30 LAB — BETA-HYDROXYBUTYRIC ACID: Beta-Hydroxybutyric Acid: 0.12 mmol/L (ref 0.05–0.27)

## 2019-06-30 LAB — CBC
HCT: 27.4 % — ABNORMAL LOW (ref 36.0–46.0)
Hemoglobin: 8.4 g/dL — ABNORMAL LOW (ref 12.0–15.0)
MCH: 26.8 pg (ref 26.0–34.0)
MCHC: 30.7 g/dL (ref 30.0–36.0)
MCV: 87.3 fL (ref 80.0–100.0)
Platelets: 334 10*3/uL (ref 150–400)
RBC: 3.14 MIL/uL — ABNORMAL LOW (ref 3.87–5.11)
RDW: 14.9 % (ref 11.5–15.5)
WBC: 12.4 10*3/uL — ABNORMAL HIGH (ref 4.0–10.5)
nRBC: 0 % (ref 0.0–0.2)

## 2019-06-30 LAB — GLUCOSE, CAPILLARY
Glucose-Capillary: 252 mg/dL — ABNORMAL HIGH (ref 70–99)
Glucose-Capillary: 212 mg/dL — ABNORMAL HIGH (ref 70–99)

## 2019-06-30 LAB — BASIC METABOLIC PANEL
Anion gap: 9 (ref 5–15)
Anion gap: 14 (ref 5–15)
BUN: 20 mg/dL (ref 6–20)
BUN: 18 mg/dL (ref 6–20)
CO2: 26 mmol/L (ref 22–32)
CO2: 22 mmol/L (ref 22–32)
Calcium: 8 mg/dL — ABNORMAL LOW (ref 8.9–10.3)
Calcium: 8.3 mg/dL — ABNORMAL LOW (ref 8.9–10.3)
Chloride: 103 mmol/L (ref 98–111)
Chloride: 101 mmol/L (ref 98–111)
Creatinine, Ser: 1.3 mg/dL — ABNORMAL HIGH (ref 0.44–1.00)
Creatinine, Ser: 1.45 mg/dL — ABNORMAL HIGH (ref 0.44–1.00)
GFR calc Af Amer: 53 mL/min — ABNORMAL LOW (ref >60)
GFR calc Af Amer: 47 mL/min — ABNORMAL LOW (ref >60)
GFR calc non Af Amer: 46 mL/min — ABNORMAL LOW (ref >60)
GFR calc non Af Amer: 40 mL/min — ABNORMAL LOW (ref >60)
Glucose, Bld: 173 mg/dL — ABNORMAL HIGH (ref 70–99)
Glucose, Bld: 266 mg/dL — ABNORMAL HIGH (ref 70–99)
Potassium: 2.8 mmol/L — ABNORMAL LOW (ref 3.5–5.1)
Potassium: 3.2 mmol/L — ABNORMAL LOW (ref 3.5–5.1)
Sodium: 138 mmol/L (ref 135–145)
Sodium: 137 mmol/L (ref 135–145)

## 2019-06-30 LAB — MAGNESIUM: Magnesium: 1.3 mg/dL — ABNORMAL LOW (ref 1.7–2.4)

## 2019-06-30 LAB — CBG MONITORING, ED
Glucose-Capillary: 165 mg/dL — ABNORMAL HIGH (ref 70–99)
Glucose-Capillary: 169 mg/dL — ABNORMAL HIGH (ref 70–99)
Glucose-Capillary: 194 mg/dL — ABNORMAL HIGH (ref 70–99)

## 2019-06-30 MED ORDER — MELATONIN 3 MG PO TABS
3.0000 mg | ORAL_TABLET | Freq: Every day | ORAL | Status: DC
  Administered 2019-06-30 – 2019-07-03 (×4): 3 mg via ORAL
  Filled 2019-06-30 (×4): qty 1

## 2019-06-30 MED ORDER — FENTANYL CITRATE (PF) 100 MCG/2ML IJ SOLN
25.0000 ug | INTRAMUSCULAR | Status: DC | PRN
  Administered 2019-06-30: 14:00:00 25 ug via INTRAVENOUS
  Filled 2019-06-30: qty 2

## 2019-06-30 MED ORDER — POTASSIUM CHLORIDE 10 MEQ/100ML IV SOLN
10.0000 meq | INTRAVENOUS | Status: AC
  Administered 2019-06-30 (×2): 10 meq via INTRAVENOUS
  Filled 2019-06-30 (×2): qty 100

## 2019-06-30 MED ORDER — POTASSIUM CHLORIDE CRYS ER 20 MEQ PO TBCR
40.0000 meq | EXTENDED_RELEASE_TABLET | ORAL | Status: AC
  Filled 2019-06-30: qty 2

## 2019-06-30 MED ORDER — POTASSIUM CHLORIDE 10 MEQ/100ML IV SOLN
INTRAVENOUS | Status: AC
  Administered 2019-06-30: 10 meq
  Filled 2019-06-30: qty 100

## 2019-06-30 MED ORDER — POTASSIUM CHLORIDE 10 MEQ/100ML IV SOLN
10.0000 meq | INTRAVENOUS | Status: AC
  Administered 2019-06-30 (×2): 10 meq via INTRAVENOUS
  Filled 2019-06-30 (×2): qty 100

## 2019-06-30 MED ORDER — HYDROMORPHONE HCL 2 MG/ML IJ SOLN
0.5000 mg | INTRAMUSCULAR | Status: DC | PRN
  Administered 2019-06-30 – 2019-07-01 (×5): 0.5 mg via INTRAVENOUS
  Filled 2019-06-30 (×5): qty 1

## 2019-06-30 MED ORDER — MAGNESIUM SULFATE 2 GM/50ML IV SOLN
2.0000 g | Freq: Once | INTRAVENOUS | Status: AC
  Administered 2019-06-30: 2 g via INTRAVENOUS
  Filled 2019-06-30: qty 50

## 2019-06-30 NOTE — Progress Notes (Signed)
Pt arrived to room 1519 from ED. VS are stable. Will continue to monitor.

## 2019-06-30 NOTE — Progress Notes (Signed)
PROGRESS NOTE    Deborah [name]  XNA:[id number] DOB: 01-Mar-1966 DOA: 06/29/2019 PCP: [name], DO   Brief Narrative: 54 year old with past medical history significant for diabetes type 2, gastroparesis, recent and recurrent admissions for DKA, last [id number], chronic systolic heart failure, paroxysmal A. fib on Eliquis, CKD stage IIIb who presented with sudden onset nausea, vomiting and abdominal pain.  Patient was found to be in DKA.   Assessment & Plan:   Principal Problem:   DKA (diabetic ketoacidoses) ([place]) Active Problems:   Essential hypertension, benign   Uncontrolled type 2 diabetes mellitus with diabetic neuropathy, with long-term current use of insulin ([facility])   Hypokalemia   Diabetic gastroparesis ([facility])   Morbid obesity ([place])   Chronic systolic CHF (congestive heart failure) ([place])   1-DKA, uncontrolled diabetes: Type II Patient was admitted started on IV fluids, insulin drip. Patient presented with hyperglycemia anion gap at 19. Gap closed.  Back on long-acting, continue with sliding scale insulin.  2-Diabetic gastroparesis, abdominal pain: She continues to have abdominal pain and nausea and vomiting. She is intolerant to Reglan, she develop adverse effect from it, tremors Continue with supportive care. Low dose dilaudid for 24 hours, explained to patient importance to try to limit opioid to prevent worsening of gastroparesis.  3-leukocytosis: Reactive  4-Hypokalemia: She vomited oral potassium supplementation.  Will provide IV KCl.  5-Chronic systolic heart failure: To metoprolol and hydralazine.  Continue to hold torsemide.  6-paroxysmal A. fib: Continue with Eliquis and Toprol 7-GERD;  Continue with PPI 8-hypomagnesemia: Replete IV      Estimated body mass index is 54.07 kg/m as calculated from the following:   Height as of this encounter: 5\' 6"  (1.676 m).   Weight as of this encounter: 152 kg.   DVT prophylaxis: Eliquis Code  Status: Full code Family Communication: Care discussed with patient Disposition Plan:  Patient is from: Home Anticipated d/c date: Home in 1 or 2 days after improvement of diabetes gastroparesis Barriers to d/c or necessity for inpatient status: Remain in the hospital, patient is still vomiting, complaining of abdominal pain and required IV pain medication.  Consultants:   None  Procedures:   None  Antimicrobials:   None Subjective: She is complaining of abdominal pain, she vomited this morning potassium.   Objective: Vitals:   06/30/19 1100 06/30/19 1130 06/30/19 1225 06/30/19 1310  BP: (!) 128/46 129/84  140/86  Pulse: 95 95  72  Resp: (!) 23 18  19   Temp:   98.1 F (36.7 C) 97.6 F (36.4 C)  TempSrc:    Oral  SpO2: 96% 93%  95%  Weight:      Height:        Intake/Output Summary (Last 24 hours) at 06/30/2019 1546 Last data filed at 06/29/2019 1611 Gross per 24 hour  Intake 462.92 ml  Output --  Net 462.92 ml   Filed Weights   06/29/19 0915  Weight: (!) 152 kg    Examination:  General exam: Appears calm and comfortable  Respiratory system: Clear to auscultation. Respiratory effort normal. Cardiovascular system: S1 & S2 heard, RRR. No JVD, murmurs, rubs, gallops or clicks. No pedal edema. Gastrointestinal system: Abdomen is nondistended, soft and nontender. No organomegaly or masses felt. Normal bowel sounds heard. Central nervous system: Alert and oriented.  Extremities: Symmetric 5 x 5 power.     Data Reviewed: I have personally reviewed following labs and imaging studies  CBC: Recent Labs  Lab 06/29/19 0928 06/30/19 0539  WBC 13.9*  12.4*  NEUTROABS 11.2*  --   HGB 10.6* 8.4*  HCT 33.5* 27.4*  MCV 84.4 87.3  PLT 427* 914   Basic Metabolic Panel: Recent Labs  Lab 06/26/19 1046 06/29/19 0928 06/29/19 1313 06/29/19 1722 06/30/19 0410 06/30/19 0539  NA 138 138 137 140 138  --   K 3.7 3.0* 3.3* 2.8* 2.8*  --   CL 108 97* 99 105 103  --    CO2 21* 22 24 26 26   --   GLUCOSE 259* 433* 390* 175* 173*  --   BUN 31* 26* 26* 23* 20  --   CREATININE 1.29* 1.47* 1.50* 1.27* 1.30*  --   CALCIUM 8.8* 9.2 8.6* 7.8* 8.0*  --   MG  --  0.9*  --   --   --  1.3*   GFR: Estimated Creatinine Clearance: 74.4 mL/min (A) (by C-G formula based on SCr of 1.3 mg/dL (H)). Liver Function Tests: Recent Labs  Lab 06/29/19 0928  AST 19  ALT 16  ALKPHOS 122  BILITOT 0.8  PROT 8.0  ALBUMIN 3.5   Recent Labs  Lab 06/29/19 0928  LIPASE 39   No results for input(s): AMMONIA in the last 168 hours. Coagulation Profile: No results for input(s): INR, PROTIME in the last 168 hours. Cardiac Enzymes: No results for input(s): CKTOTAL, CKMB, CKMBINDEX, TROPONINI in the last 168 hours. BNP (last 3 results) No results for input(s): PROBNP in the last 8760 hours. HbA1C: No results for input(s): HGBA1C in the last 72 hours. CBG: Recent Labs  Lab 06/29/19 2026 06/29/19 2146 06/30/19 0023 06/30/19 0750 06/30/19 1213  GLUCAP 201* 165* 169* 165* 194*   Lipid Profile: No results for input(s): CHOL, HDL, LDLCALC, TRIG, CHOLHDL, LDLDIRECT in the last 72 hours. Thyroid Function Tests: No results for input(s): TSH, T4TOTAL, FREET4, T3FREE, THYROIDAB in the last 72 hours. Anemia Panel: No results for input(s): VITAMINB12, FOLATE, FERRITIN, TIBC, IRON, RETICCTPCT in the last 72 hours. Sepsis Labs: Recent Labs  Lab 06/29/19 1048 06/29/19 1313  LATICACIDVEN 4.5* 4.2*    Recent Results (from the past 240 hour(s))  Urine culture     Status: Abnormal   Collection Time: 06/22/19  7:52 PM   Specimen: Urine, Clean Catch  Result Value Ref Range Status   Specimen Description   Final    URINE, CLEAN CATCH Performed at [facility], [place] [address] [place], [place] [address]    Special Requests   Final    NONE Performed at [facility], [place] [address], [place], [place] [address]    Culture MULTIPLE SPECIES  PRESENT, SUGGEST RECOLLECTION (A)  Final   Report Status 06/24/2019 FINAL  Final  SARS CORONAVIRUS 2 (TAT 6-24 HRS) Nasopharyngeal Nasopharyngeal Swab     Status: None   Collection Time: 06/23/19 12:46 AM   Specimen: Nasopharyngeal Swab  Result Value Ref Range Status   SARS Coronavirus 2 NEGATIVE NEGATIVE Final    Comment: (NOTE) SARS-CoV-2 target nucleic acids are NOT DETECTED. The SARS-CoV-2 RNA is generally detectable in upper and lower respiratory specimens during the acute phase of infection. Negative results do not preclude SARS-CoV-2 infection, do not rule out co-infections with other pathogens, and should not be used as the sole basis for treatment or other patient management decisions. Negative results must be combined with clinical observations, patient history, and epidemiological information. The expected result is Negative. Fact Sheet for Patients: SugarRoll.be Fact Sheet for Healthcare Providers: https://www.woods-mathews.com/ This test is not yet  approved or cleared by the [place] and  has been authorized for detection and/or diagnosis of SARS-CoV-2 by FDA under an Emergency Use Authorization (EUA). This EUA will remain  in effect (meaning this test can be used) for the duration of the COVID-19 declaration under Section 56 4(b)(1) of the Act, 21 U.S.C. section 360bbb-3(b)(1), unless the authorization is terminated or revoked sooner. Performed at [facility], [place] [address], [place], [place] [address]   Respiratory Panel by RT PCR (Flu A&B, Covid) - Nasopharyngeal Swab     Status: None   Collection Time: 06/29/19 11:28 AM   Specimen: Nasopharyngeal Swab  Result Value Ref Range Status   SARS Coronavirus 2 by RT PCR NEGATIVE NEGATIVE Final    Comment: (NOTE) SARS-CoV-2 target nucleic acids are NOT DETECTED. The SARS-CoV-2 RNA is generally detectable in upper respiratoy specimens during the acute phase of  infection. The lowest concentration of SARS-CoV-2 viral copies this assay can detect is 131 copies/mL. A negative result does not preclude SARS-Cov-2 infection and should not be used as the sole basis for treatment or other patient management decisions. A negative result may occur with  improper specimen collection/handling, submission of specimen other than nasopharyngeal swab, presence of viral mutation(s) within the areas targeted by this assay, and inadequate number of viral copies (<131 copies/mL). A negative result must be combined with clinical observations, patient history, and epidemiological information. The expected result is Negative. Fact Sheet for Patients:  PinkCheek.be Fact Sheet for Healthcare Providers:  GravelBags.it This test is not yet ap proved or cleared by the [place] FDA and  has been authorized for detection and/or diagnosis of SARS-CoV-2 by FDA under an Emergency Use Authorization (EUA). This EUA will remain  in effect (meaning this test can be used) for the duration of the COVID-19 declaration under Section 564(b)(1) of the Act, 21 U.S.C. section 360bbb-3(b)(1), unless the authorization is terminated or revoked sooner.    Influenza A by PCR NEGATIVE NEGATIVE Final   Influenza B by PCR NEGATIVE NEGATIVE Final    Comment: (NOTE) The Xpert Xpress SARS-CoV-2/FLU/RSV assay is intended as an aid in  the diagnosis of influenza from Nasopharyngeal swab specimens and  should not be used as a sole basis for treatment. Nasal washings and  aspirates are unacceptable for Xpert Xpress SARS-CoV-2/FLU/RSV  testing. Fact Sheet for Patients: PinkCheek.be Fact Sheet for Healthcare Providers: GravelBags.it This test is not yet approved or cleared by the [place] FDA and  has been authorized for detection and/or diagnosis of SARS-CoV-2 by  FDA under  an Emergency Use Authorization (EUA). This EUA will remain  in effect (meaning this test can be used) for the duration of the  Covid-19 declaration under Section 564(b)(1) of the Act, 21  U.S.C. section 360bbb-3(b)(1), unless the authorization is  terminated or revoked. Performed at [facility], [place] [address], [place], [place] [address]          Radiology Studies: DG Chest 2 View  Result Date: 06/29/2019 CLINICAL DATA:  Chest pain and shortness of breath. Diaphoresis. EXAM: CHEST - 2 VIEW COMPARISON:  06/22/2019 FINDINGS: Borderline enlarged cardiac silhouette. Clear lungs with normal vascularity. Thoracic spine degenerative changes. IMPRESSION: Borderline cardiomegaly. No acute abnormality. Electronically Signed   By: Claudie Revering M.D.   On: 06/29/2019 10:00        Scheduled Meds: . amLODipine  10 mg Oral q morning - 10a  . apixaban  5 mg Oral BID  .  DULoxetine  40 mg Oral BID  . hydrALAZINE  25 mg Oral TID  . insulin aspart  0-15 Units Subcutaneous TID WC  . insulin aspart  0-5 Units Subcutaneous QHS  . insulin glargine  40 Units Subcutaneous Q24H  . metoprolol succinate  25 mg Oral Daily  . pantoprazole (PROTONIX) IV  40 mg Intravenous Q12H   Continuous Infusions:   LOS: 0 days    Time spent: 35 minutes.     Elmarie Shiley, MD [facility]   If 7PM-7AM, please contact night-coverage [email or web address]  06/30/2019, 3:46 PM

## 2019-06-30 NOTE — ED Notes (Signed)
ED TO INPATIENT HANDOFF REPORT  ED Nurse Name and Phone #: [phone]  S Name/Age/Gender Deborah [name] 53 y.o. female Room/Bed: WA15/WA15  Code Status   Code Status: Full Code  Home/SNF/Other Home Patient oriented to: self, place, time and situation Is this baseline? Yes   Triage Complete: Triage complete  Chief Complaint DKA (diabetic ketoacidoses) ([place]) [E11.10]  Triage Note Patient c/o chest pain, SOB, diaphoresis, and weeping of her lower extremities x 1 hour ago.    Allergies Allergies  Allergen Reactions  . Contrast Media [Iodinated Diagnostic Agents] Anaphylaxis    Cardiac arrest  . Diazepam [name]  . Isovue [Iopamidol] Anaphylaxis    Patient had seizure like activity and then code post 100 cc of isovue 300  . Lisinopril Anaphylaxis    Tongue and mouth swelling  . Penicillins Palpitations    Has patient had a PCN reaction causing immediate rash, facial/tongue/throat swelling, SOB or lightheadedness with hypotension: Yes, heart races Has patient had a PCN reaction causing severe rash involving mucus membranes or skin necrosis: No Has patient had a PCN reaction that required hospitalization: Yes  Has patient had a PCN reaction occurring within the last 10 years: No   . Acetaminophen Nausea Only and Other (See Comments)    Irritates stomach ulcer  Abdominal pain  . Tolmetin Nausea Only and Other (See Comments)    ULCER  . Aspirin Other (See Comments)    Irritates stomach ulcer   . Food Hives and Swelling    Reaction to Carrots, ketchup   . Nsaids Other (See Comments)    ULCER  . Tramadol Nausea And Vomiting    Level of Care/Admitting Diagnosis ED Disposition    ED Disposition Condition Comment   Admit  Hospital Area: [facility] [[id number]]  Level of Care: Telemetry [5]  Admit to tele based on following criteria: Complex arrhythmia (Bradycardia/Tachycardia)  Covid Evaluation: Asymptomatic Screening Protocol (No  Symptoms)  Diagnosis: DKA (diabetic ketoacidoses) [facility]) [[id number]]  Admitting Physician: Elmarie Shiley [phone]  Attending Physician: Elmarie Shiley [phone]       B Medical/Surgery History Past Medical History:  Diagnosis Date  . Acute back pain with sciatica, left   . Acute back pain with sciatica, right   . Acute on chronic congestive heart failure ([place])   . AKI (acute kidney injury) ([place])   . Anemia, unspecified   . Chest pain 12/2015  . Chronic pain   . Chronic systolic CHF (congestive heart failure) ([place])   . Cough   . Diabetes mellitus   . Esophageal reflux   . Fibromyalgia   . Flu-like symptoms 04/05/2019  . Gastric ulcer   . Gastroparesis   . Gout   . Hyperlipidemia   . Hypertension   . Hypokalemia   . Hypomagnesemia   . Lumbosacral stenosis   . NICM (nonischemic cardiomyopathy) ([place])   . Obesity   . PAF (paroxysmal atrial fibrillation) ([place])   . Pneumonia   . Stroke ([place]) 02/2011  . Vitamin B12 deficiency anemia    Past Surgical History:  Procedure Laterality Date  . CATARACT EXTRACTION  01/2014  . CHOLECYSTECTOMY       A IV Location/Drains/Wounds Patient Lines/Drains/Airways Status   Active Line/Drains/Airways    Name:   Placement date:   Placement time:   Site:   Days:   Peripheral IV 06/29/19 Right Antecubital   06/29/19    0926    Antecubital   1   Peripheral  IV 06/29/19 Left Antecubital   06/29/19    0939    Antecubital   1   Peripheral IV 06/29/19 Right;Upper Arm   06/29/19    1221    Arm   1          Intake/Output Last 24 hours  Intake/Output Summary (Last 24 hours) at 06/30/2019 1211 Last data filed at 06/29/2019 1611 Gross per 24 hour  Intake 2713.32 ml  Output --  Net 2713.32 ml    Labs/Imaging Results for orders placed or performed during the hospital encounter of 06/29/19 (from the past 48 hour(s))  CBG monitoring, ED     Status: Abnormal   Collection Time: 06/29/19  9:17 AM  Result Value Ref Range   Glucose-Capillary 383  (H) 70 - 99 mg/dL    Comment: Glucose reference range applies only to samples taken after fasting for at least 8 hours.  Basic metabolic panel     Status: Abnormal   Collection Time: 06/29/19  9:28 AM  Result Value Ref Range   Sodium 138 135 - 145 mmol/L   Potassium 3.0 (L) 3.5 - 5.1 mmol/L   Chloride 97 (L) 98 - 111 mmol/L   CO2 22 22 - 32 mmol/L   Glucose, Bld 433 (H) 70 - 99 mg/dL    Comment: Glucose reference range applies only to samples taken after fasting for at least 8 hours.   BUN 26 (H) 6 - 20 mg/dL   Creatinine, Ser 1.47 (H) 0.44 - 1.00 mg/dL   Calcium 9.2 8.9 - 10.3 mg/dL   GFR calc non Af Amer 40 (L) >60 mL/min   GFR calc Af Amer 46 (L) >60 mL/min   Anion gap 19 (H) 5 - 15    Comment: Performed at [facility], [place] [address] [place], [place] [address]  Troponin I (High Sensitivity)     Status: Abnormal   Collection Time: 06/29/19  9:28 AM  Result Value Ref Range   Troponin I (High Sensitivity) 28 (H) <18 ng/L    Comment: (NOTE) Elevated high sensitivity troponin I (hsTnI) values and significant  changes across serial measurements may suggest ACS but many other  chronic and acute conditions are known to elevate hsTnI results.  Refer to the "Links" section for chest pain algorithms and additional  guidance. Performed at [facility], [place] [address], [place], [place] [address]   CBC with Differential     Status: Abnormal   Collection Time: 06/29/19  9:28 AM  Result Value Ref Range   WBC 13.9 (H) 4.0 - 10.5 K/uL   RBC 3.97 3.87 - 5.11 MIL/uL   Hemoglobin 10.6 (L) 12.0 - 15.0 g/dL   HCT 33.5 (L) 36.0 - 46.0 %   MCV 84.4 80.0 - 100.0 fL   MCH 26.7 26.0 - 34.0 pg   MCHC 31.6 30.0 - 36.0 g/dL   RDW 14.8 11.5 - 15.5 %   Platelets 427 (H) 150 - 400 K/uL   nRBC 0.0 0.0 - 0.2 %   Neutrophils Relative % 80 %   Neutro Abs 11.2 (H) 1.7 - 7.7 K/uL   Lymphocytes Relative 13 %   Lymphs Abs 1.8 0.7 - 4.0 K/uL   Monocytes Relative 5 %    Monocytes Absolute 0.6 0.1 - 1.0 K/uL   Eosinophils Relative 1 %   Eosinophils Absolute 0.2 0.0 - 0.5 K/uL   Basophils Relative 0 %   Basophils Absolute 0.1 0.0 - 0.1 K/uL  Immature Granulocytes 1 %   Abs Immature Granulocytes 0.08 (H) 0.00 - 0.07 K/uL    Comment: Performed at [facility], [place] [address] [place], [place] [address]  Beta-hydroxybutyric acid     Status: Abnormal   Collection Time: 06/29/19  9:28 AM  Result Value Ref Range   Beta-Hydroxybutyric Acid 0.64 (H) 0.05 - 0.27 mmol/L    Comment: Performed at [facility], [place] [address] [place], [place] [address]  Hepatic function panel     Status: None   Collection Time: 06/29/19  9:28 AM  Result Value Ref Range   Total Protein 8.0 6.5 - 8.1 g/dL   Albumin 3.5 3.5 - 5.0 g/dL   AST 19 15 - 41 U/L   ALT 16 0 - 44 U/L   Alkaline Phosphatase 122 38 - 126 U/L   Total Bilirubin 0.8 0.3 - 1.2 mg/dL   Bilirubin, Direct 0.1 0.0 - 0.2 mg/dL   Indirect Bilirubin 0.7 0.3 - 0.9 mg/dL    Comment: Performed at [facility], [place] [address] [place], [place] [address]  Lipase, blood     Status: None   Collection Time: 06/29/19  9:28 AM  Result Value Ref Range   Lipase 39 11 - 51 U/L    Comment: Performed at [facility], [place] [address], [place], [place] [address]  Magnesium     Status: Abnormal   Collection Time: 06/29/19  9:28 AM  Result Value Ref Range   Magnesium 0.9 (LL) 1.7 - 2.4 mg/dL    Comment: CRITICAL RESULT CALLED TO, READ BACK BY AND VERIFIED WITHLinden Dolin RN AT 0932 06/29/19 MULLINS,T Performed at [facility], [place] [address] [place], [place] [address]   Blood gas, venous     Status: Abnormal   Collection Time: 06/29/19  9:40 AM  Result Value Ref Range   FIO2 21.00    pH, Ven 7.557 (H) 7.250 - 7.430   pCO2, Ven 25.4 (L) 44.0 - 60.0 mmHg   pO2, Ven 35.6 32.0 - 45.0 mmHg   Bicarbonate 22.6 20.0 - 28.0 mmol/L    Acid-Base Excess 1.3 0.0 - 2.0 mmol/L   O2 Saturation 69.1 %   Patient temperature 98.6     Comment: Performed at [facility], [place] [address] [place], [place] [address]  Lactic acid, plasma     Status: Abnormal   Collection Time: 06/29/19 10:48 AM  Result Value Ref Range   Lactic Acid, Venous 4.5 (HH) 0.5 - 1.9 mmol/L    Comment: CRITICAL RESULT CALLED TO, READ BACK BY AND VERIFIED WITHKathryne Sharper RN AT 1118 06/29/19 MULLINS,T Performed at [facility], [place] [address] [place], [place] [address]   Respiratory Panel by RT PCR (Flu A&B, Covid) - Nasopharyngeal Swab     Status: None   Collection Time: 06/29/19 11:28 AM   Specimen: Nasopharyngeal Swab  Result Value Ref Range   SARS Coronavirus 2 by RT PCR NEGATIVE NEGATIVE    Comment: (NOTE) SARS-CoV-2 target nucleic acids are NOT DETECTED. The SARS-CoV-2 RNA is generally detectable in upper respiratoy specimens during the acute phase of infection. The lowest concentration of SARS-CoV-2 viral copies this assay can detect is 131 copies/mL. A negative result does not preclude SARS-Cov-2 infection and should not be used as the sole basis for treatment or other patient management decisions. A negative result may occur with  improper specimen collection/handling, submission of specimen other than nasopharyngeal swab, presence  of viral mutation(s) within the areas targeted by this assay, and inadequate number of viral copies (<131 copies/mL). A negative result must be combined with clinical observations, patient history, and epidemiological information. The expected result is Negative. Fact Sheet for Patients:  PinkCheek.be Fact Sheet for Healthcare Providers:  GravelBags.it This test is not yet ap proved or cleared by the [place] FDA and  has been authorized for detection and/or diagnosis of SARS-CoV-2 by FDA under an Emergency Use  Authorization (EUA). This EUA will remain  in effect (meaning this test can be used) for the duration of the COVID-19 declaration under Section 564(b)(1) of the Act, 21 U.S.C. section 360bbb-3(b)(1), unless the authorization is terminated or revoked sooner.    Influenza A by PCR NEGATIVE NEGATIVE   Influenza B by PCR NEGATIVE NEGATIVE    Comment: (NOTE) The Xpert Xpress SARS-CoV-2/FLU/RSV assay is intended as an aid in  the diagnosis of influenza from Nasopharyngeal swab specimens and  should not be used as a sole basis for treatment. Nasal washings and  aspirates are unacceptable for Xpert Xpress SARS-CoV-2/FLU/RSV  testing. Fact Sheet for Patients: PinkCheek.be Fact Sheet for Healthcare Providers: GravelBags.it This test is not yet approved or cleared by the [place] FDA and  has been authorized for detection and/or diagnosis of SARS-CoV-2 by  FDA under an Emergency Use Authorization (EUA). This EUA will remain  in effect (meaning this test can be used) for the duration of the  Covid-19 declaration under Section 564(b)(1) of the Act, 21  U.S.C. section 360bbb-3(b)(1), unless the authorization is  terminated or revoked. Performed at [facility], [place] [address], [place], [place] [address]   Troponin I (High Sensitivity)     Status: Abnormal   Collection Time: 06/29/19 11:28 AM  Result Value Ref Range   Troponin I (High Sensitivity) 24 (H) <18 ng/L    Comment: (NOTE) Elevated high sensitivity troponin I (hsTnI) values and significant  changes across serial measurements may suggest ACS but many other  chronic and acute conditions are known to elevate hsTnI results.  Refer to the "Links" section for chest pain algorithms and additional  guidance. Performed at [facility], [place] [address] [place], [place] [address]   CBG monitoring, ED     Status: Abnormal   Collection Time:  06/29/19 12:32 PM  Result Value Ref Range   Glucose-Capillary 393 (H) 70 - 99 mg/dL    Comment: Glucose reference range applies only to samples taken after fasting for at least 8 hours.  Urinalysis, Routine w reflex microscopic     Status: Abnormal   Collection Time: 06/29/19  1:13 PM  Result Value Ref Range   Color, Urine STRAW (A) YELLOW   APPearance CLEAR CLEAR   Specific Gravity, Urine 1.007 1.005 - 1.030   pH 5.0 5.0 - 8.0   Glucose, UA >=500 (A) NEGATIVE mg/dL   Hgb urine dipstick SMALL (A) NEGATIVE   Bilirubin Urine NEGATIVE NEGATIVE   Ketones, ur NEGATIVE NEGATIVE mg/dL   Protein, ur 100 (A) NEGATIVE mg/dL   Nitrite NEGATIVE NEGATIVE   Leukocytes,Ua NEGATIVE NEGATIVE   RBC / HPF 0-5 0 - 5 RBC/hpf   WBC, UA 0-5 0 - 5 WBC/hpf   Bacteria, UA NONE SEEN NONE SEEN   Squamous Epithelial / LPF 0-5 0 - 5   Mucus PRESENT     Comment: Performed at [facility], [place] [address], [place], [place] [address]  Lactic acid, plasma  Status: Abnormal   Collection Time: 06/29/19  1:13 PM  Result Value Ref Range   Lactic Acid, Venous 4.2 (HH) 0.5 - 1.9 mmol/L    Comment: CRITICAL VALUE NOTED.  VALUE IS CONSISTENT WITH PREVIOUSLY REPORTED AND CALLED VALUE. Performed at [facility], [place] [address] [place], [place] [address]   Basic metabolic panel     Status: Abnormal   Collection Time: 06/29/19  1:13 PM  Result Value Ref Range   Sodium 137 135 - 145 mmol/L   Potassium 3.3 (L) 3.5 - 5.1 mmol/L   Chloride 99 98 - 111 mmol/L   CO2 24 22 - 32 mmol/L   Glucose, Bld 390 (H) 70 - 99 mg/dL    Comment: Glucose reference range applies only to samples taken after fasting for at least 8 hours.   BUN 26 (H) 6 - 20 mg/dL   Creatinine, Ser 1.50 (H) 0.44 - 1.00 mg/dL   Calcium 8.6 (L) 8.9 - 10.3 mg/dL   GFR calc non Af Amer 39 (L) >60 mL/min   GFR calc Af Amer 45 (L) >60 mL/min   Anion gap 14 5 - 15    Comment: Performed at [facility],  [place] [address], [place], [place] [address]  CBG monitoring, ED     Status: Abnormal   Collection Time: 06/29/19  1:40 PM  Result Value Ref Range   Glucose-Capillary 345 (H) 70 - 99 mg/dL    Comment: Glucose reference range applies only to samples taken after fasting for at least 8 hours.  CBG monitoring, ED     Status: Abnormal   Collection Time: 06/29/19  3:03 PM  Result Value Ref Range   Glucose-Capillary 259 (H) 70 - 99 mg/dL    Comment: Glucose reference range applies only to samples taken after fasting for at least 8 hours.  CBG monitoring, ED     Status: Abnormal   Collection Time: 06/29/19  4:02 PM  Result Value Ref Range   Glucose-Capillary 209 (H) 70 - 99 mg/dL    Comment: Glucose reference range applies only to samples taken after fasting for at least 8 hours.  Beta-hydroxybutyric acid     Status: None   Collection Time: 06/29/19  4:13 PM  Result Value Ref Range   Beta-Hydroxybutyric Acid 0.07 0.05 - 0.27 mmol/L    Comment: Performed at [facility], [place] [address] [place], [place] [address]  CBG monitoring, ED     Status: Abnormal   Collection Time: 06/29/19  5:10 PM  Result Value Ref Range   Glucose-Capillary 170 (H) 70 - 99 mg/dL    Comment: Glucose reference range applies only to samples taken after fasting for at least 8 hours.   Comment 1 Notify RN    Comment 2 Document in Chart   Basic metabolic panel     Status: Abnormal   Collection Time: 06/29/19  5:22 PM  Result Value Ref Range   Sodium 140 135 - 145 mmol/L   Potassium 2.8 (L) 3.5 - 5.1 mmol/L   Chloride 105 98 - 111 mmol/L   CO2 26 22 - 32 mmol/L   Glucose, Bld 175 (H) 70 - 99 mg/dL    Comment: Glucose reference range applies only to samples taken after fasting for at least 8 hours.   BUN 23 (H) 6 - 20 mg/dL   Creatinine, Ser 1.27 (H) 0.44 - 1.00 mg/dL   Calcium 7.8 (L) 8.9 - 10.3 mg/dL   GFR  calc non Af Amer 47 (L) >60 mL/min   GFR calc Af Amer 55 (L) >60 mL/min   Anion gap 9 5 -  15    Comment: Performed at [facility], [place] [address] [place], [place] [address]  CBG monitoring, ED     Status: Abnormal   Collection Time: 06/29/19  6:11 PM  Result Value Ref Range   Glucose-Capillary 144 (H) 70 - 99 mg/dL    Comment: Glucose reference range applies only to samples taken after fasting for at least 8 hours.  CBG monitoring, ED     Status: Abnormal   Collection Time: 06/29/19  7:23 PM  Result Value Ref Range   Glucose-Capillary 145 (H) 70 - 99 mg/dL    Comment: Glucose reference range applies only to samples taken after fasting for at least 8 hours.  CBG monitoring, ED     Status: Abnormal   Collection Time: 06/29/19  8:26 PM  Result Value Ref Range   Glucose-Capillary 201 (H) 70 - 99 mg/dL    Comment: Glucose reference range applies only to samples taken after fasting for at least 8 hours.  CBG monitoring, ED     Status: Abnormal   Collection Time: 06/29/19  9:46 PM  Result Value Ref Range   Glucose-Capillary 165 (H) 70 - 99 mg/dL    Comment: Glucose reference range applies only to samples taken after fasting for at least 8 hours.   Comment 1 Notify RN   CBG monitoring, ED     Status: Abnormal   Collection Time: 06/30/19 12:23 AM  Result Value Ref Range   Glucose-Capillary 169 (H) 70 - 99 mg/dL    Comment: Glucose reference range applies only to samples taken after fasting for at least 8 hours.  Basic metabolic panel     Status: Abnormal   Collection Time: 06/30/19  4:10 AM  Result Value Ref Range   Sodium 138 135 - 145 mmol/L   Potassium 2.8 (L) 3.5 - 5.1 mmol/L   Chloride 103 98 - 111 mmol/L   CO2 26 22 - 32 mmol/L   Glucose, Bld 173 (H) 70 - 99 mg/dL    Comment: Glucose reference range applies only to samples taken after fasting for at least 8 hours.   BUN 20 6 - 20 mg/dL   Creatinine, Ser 1.30 (H) 0.44 - 1.00 mg/dL   Calcium 8.0 (L) 8.9 - 10.3 mg/dL   GFR calc non Af Amer 46 (L) >60 mL/min   GFR calc Af Amer 53 (L) >60 mL/min    Anion gap 9 5 - 15    Comment: Performed at [facility], [place] [address], [place], [place] [address]  CBC     Status: Abnormal   Collection Time: 06/30/19  5:39 AM  Result Value Ref Range   WBC 12.4 (H) 4.0 - 10.5 K/uL   RBC 3.14 (L) 3.87 - 5.11 MIL/uL   Hemoglobin 8.4 (L) 12.0 - 15.0 g/dL   HCT 27.4 (L) 36.0 - 46.0 %   MCV 87.3 80.0 - 100.0 fL   MCH 26.8 26.0 - 34.0 pg   MCHC 30.7 30.0 - 36.0 g/dL   RDW 14.9 11.5 - 15.5 %   Platelets 334 150 - 400 K/uL   nRBC 0.0 0.0 - 0.2 %    Comment: Performed at [facility], [place] [address] [place], [place] [address]  Magnesium     Status: Abnormal   Collection Time: 06/30/19  5:39 AM  Result Value Ref Range   Magnesium 1.3 (L) 1.7 - 2.4 mg/dL    Comment: Performed at [facility], [place] [address] [place], [place] [address]  CBG monitoring, ED     Status: Abnormal   Collection Time: 06/30/19  7:50 AM  Result Value Ref Range   Glucose-Capillary 165 (H) 70 - 99 mg/dL    Comment: Glucose reference range applies only to samples taken after fasting for at least 8 hours.  Beta-hydroxybutyric acid     Status: None   Collection Time: 06/30/19  8:41 AM  Result Value Ref Range   Beta-Hydroxybutyric Acid 0.12 0.05 - 0.27 mmol/L    Comment: Performed at [facility], [place] [address], [place], [place] [address]   DG Chest 2 View  Result Date: 06/29/2019 CLINICAL DATA:  Chest pain and shortness of breath. Diaphoresis. EXAM: CHEST - 2 VIEW COMPARISON:  06/22/2019 FINDINGS: Borderline enlarged cardiac silhouette. Clear lungs with normal vascularity. Thoracic spine degenerative changes. IMPRESSION: Borderline cardiomegaly. No acute abnormality. Electronically Signed   By: Claudie Revering M.D.   On: 06/29/2019 10:00    Pending Labs Unresulted Labs (From admission, onward)    Start     Ordered   06/30/19 3846  Basic metabolic panel  Daily,   R     06/29/19 1808           Vitals/Pain Today's Vitals   06/30/19 1030 06/30/19 1045 06/30/19 1100 06/30/19 1130  BP: (!) 133/96  (!) 128/46 129/84  Pulse: 98 98 95 95  Resp: (!) 21 (!) 29 (!) 23 18  Temp:      TempSrc:      SpO2: 98% (!) 88% 96% 93%  Weight:      Height:      PainSc:        Isolation Precautions No active isolations  Medications Medications  dextrose 50 % solution 0-50 mL (has no administration in time range)  amLODipine (NORVASC) tablet 10 mg (10 mg Oral Given 06/30/19 1130)  hydrALAZINE (APRESOLINE) tablet 25 mg (25 mg Oral Given 06/30/19 1130)  metoprolol succinate (TOPROL-XL) 24 hr tablet 25 mg (25 mg Oral Given 06/30/19 1131)  nitroGLYCERIN (NITROSTAT) SL tablet 0.4 mg (has no administration in time range)  DULoxetine (CYMBALTA) DR capsule 40 mg (40 mg Oral Given 06/30/19 1131)  apixaban (ELIQUIS) tablet 5 mg (5 mg Oral Given 06/30/19 1131)  albuterol (PROVENTIL) (2.5 MG/3ML) 0.083% nebulizer solution 2.5 mg (has no administration in time range)  fluticasone (FLONASE) 50 MCG/ACT nasal spray 2 spray (has no administration in time range)  acetaminophen (TYLENOL) tablet 650 mg (has no administration in time range)    Or  acetaminophen (TYLENOL) suppository 650 mg (has no administration in time range)  ondansetron (ZOFRAN) tablet 4 mg ( Oral See Alternative 06/29/19 1852)    Or  ondansetron (ZOFRAN) injection 4 mg (4 mg Intravenous Given 06/29/19 1852)  metoCLOPramide (REGLAN) injection 10 mg (10 mg Intravenous Given 06/30/19 0539)  morphine 2 MG/ML injection 2 mg (2 mg Intravenous Given 06/29/19 2028)  pantoprazole (PROTONIX) injection 40 mg (40 mg Intravenous Given 06/30/19 1128)  insulin glargine (LANTUS) injection 40 Units (40 Units Subcutaneous Given 06/29/19 1934)  insulin aspart (novoLOG) injection 0-15 Units (3 Units Subcutaneous Given 06/30/19 0842)  insulin aspart (novoLOG) injection 0-5 Units (0 Units Subcutaneous Hold 06/29/19 2152)  potassium chloride SA (KLOR-CON) CR tablet 40  mEq (40 mEq Oral Refused 06/29/19 2200)  potassium chloride 10 mEq  in 100 mL IVPB (0 mEq Intravenous Stopped 06/30/19 1129)  potassium chloride SA (KLOR-CON) CR tablet 40 mEq (40 mEq Oral Refused 06/30/19 0842)  sodium chloride flush (NS) 0.9 % injection 3 mL (3 mLs Intravenous Given 06/29/19 0927)  fentaNYL (SUBLIMAZE) injection 100 mcg (100 mcg Intravenous Given 06/29/19 0945)  metoCLOPramide (REGLAN) injection 10 mg (10 mg Intravenous Given 06/29/19 0942)  lactated ringers bolus 1,000 mL (0 mLs Intravenous Stopped 06/29/19 1255)  potassium chloride 10 mEq in 100 mL IVPB (0 mEq Intravenous Stopped 06/29/19 1154)  HYDROmorphone (DILAUDID) injection 1 mg (1 mg Intravenous Given 06/29/19 1053)  ondansetron (ZOFRAN) injection 4 mg (4 mg Intravenous Given 06/29/19 1053)  magnesium sulfate IVPB 2 g 50 mL (0 g Intravenous Stopped 06/29/19 1233)  lactated ringers bolus 1,000 mL (0 mLs Intravenous Stopped 06/29/19 1428)  potassium chloride 10 mEq in 100 mL IVPB (0 mEq Intravenous Stopped 06/29/19 1604)  magnesium sulfate IVPB 2 g 50 mL (0 g Intravenous Stopped 06/30/19 1001)    Mobility walks Moderate fall risk   Focused Assessments .   R Recommendations: See Admitting Provider Note  Report given to:   Additional Notes: n/a

## 2019-07-01 LAB — GLUCOSE, CAPILLARY
Glucose-Capillary: 222 mg/dL — ABNORMAL HIGH (ref 70–99)
Glucose-Capillary: 234 mg/dL — ABNORMAL HIGH (ref 70–99)
Glucose-Capillary: 270 mg/dL — ABNORMAL HIGH (ref 70–99)
Glucose-Capillary: 240 mg/dL — ABNORMAL HIGH (ref 70–99)

## 2019-07-01 LAB — BASIC METABOLIC PANEL
Anion gap: 8 (ref 5–15)
BUN: 21 mg/dL — ABNORMAL HIGH (ref 6–20)
CO2: 26 mmol/L (ref 22–32)
Calcium: 7.8 mg/dL — ABNORMAL LOW (ref 8.9–10.3)
Chloride: 102 mmol/L (ref 98–111)
Creatinine, Ser: 1.85 mg/dL — ABNORMAL HIGH (ref 0.44–1.00)
GFR calc Af Amer: 35 mL/min — ABNORMAL LOW (ref >60)
GFR calc non Af Amer: 30 mL/min — ABNORMAL LOW (ref >60)
Glucose, Bld: 254 mg/dL — ABNORMAL HIGH (ref 70–99)
Potassium: 3.6 mmol/L (ref 3.5–5.1)
Sodium: 136 mmol/L (ref 135–145)

## 2019-07-01 LAB — MAGNESIUM: Magnesium: 1.7 mg/dL (ref 1.7–2.4)

## 2019-07-01 MED ORDER — ERYTHROMYCIN BASE 250 MG PO TABS
250.0000 mg | ORAL_TABLET | Freq: Three times a day (TID) | ORAL | Status: DC
  Administered 2019-07-01 – 2019-07-04 (×13): 250 mg via ORAL
  Filled 2019-07-01 (×14): qty 1

## 2019-07-01 MED ORDER — SODIUM CHLORIDE 0.9 % IV SOLN: INTRAVENOUS | Status: DC

## 2019-07-01 MED ORDER — HYDROMORPHONE HCL 2 MG/ML IJ SOLN
0.5000 mg | Freq: Four times a day (QID) | INTRAMUSCULAR | Status: DC | PRN
  Administered 2019-07-01 – 2019-07-03 (×6): 0.5 mg via INTRAVENOUS
  Filled 2019-07-01 (×6): qty 1

## 2019-07-01 MED ORDER — INSULIN ASPART 100 UNIT/ML ~~LOC~~ SOLN
3.0000 [IU] | Freq: Three times a day (TID) | SUBCUTANEOUS | Status: DC
  Administered 2019-07-01 – 2019-07-02 (×4): 3 [IU] via SUBCUTANEOUS

## 2019-07-01 MED ORDER — INSULIN GLARGINE 100 UNIT/ML ~~LOC~~ SOLN
45.0000 [IU] | SUBCUTANEOUS | Status: DC
  Administered 2019-07-01 – 2019-07-03 (×3): 45 [IU] via SUBCUTANEOUS
  Filled 2019-07-01 (×4): qty 0.45

## 2019-07-01 NOTE — Progress Notes (Signed)
Inpatient Diabetes Program Recommendations  AACE/ADA: New Consensus Statement on Inpatient Glycemic Control (2015)  Target Ranges:  Prepandial:   less than 140 mg/dL      Peak postprandial:   less than 180 mg/dL (1-2 hours)      Critically ill patients:  140 - 180 mg/dL   Results for KC, SEDLAK (MRN [id number]) as of 07/01/2019 10:15  Ref. Range 06/30/2019 07:50 06/30/2019 12:13 06/30/2019 16:52 06/30/2019 21:32  Glucose-Capillary Latest Ref Range: 70 - 99 mg/dL 165 (H)  3 units NOVOLOG  194 (H)  3 units NOVOLOG  252 (H)  8 units NOVOLOG +  40 units LANTUS given at 6:35pm  212 (H)  2 units NOVOLOG    Results for RETAJ, HILBUN (MRN [id number]) as of 07/01/2019 10:15  Ref. Range 07/01/2019 07:47  Glucose-Capillary Latest Ref Range: 70 - 99 mg/dL 222 (H)  5 units NOVOLOG     Home DM Meds: Lantus 45 units QHS       Novolog 30 units TID   Current Orders: Lantus 40 units Q24H      Novolog Moderate Correction Scale/ SSI (0-15 units) TID AC + HS    MD- Please consider the following in-hospital insulin adjustments:  1. Increase Lantus to 45 units Q24 hours  2. Start Novolog Meal Coverage: Novolog 5 units TID with meals  (Please add the following Hold Parameters: Hold if pt eats <50% of meal, Hold if pt NPO)      --Will follow patient during hospitalization--  Wyn Quaker RN, MSN, CDE Diabetes Coordinator Inpatient Glycemic Control Team Team Pager: [phone] (8a-5p)

## 2019-07-01 NOTE — Progress Notes (Signed)
The pt's bp is 169/102, she has a hx of htn, hydralazine 25 mg given as schedule . I will continue to monitor.

## 2019-07-01 NOTE — Progress Notes (Signed)
The pt's bp is now 140/76 after bp meds given.

## 2019-07-01 NOTE — Progress Notes (Signed)
PROGRESS NOTE    Deborah [name]  VVO:[id number] DOB: 1966/02/01 DOA: 06/29/2019 PCP: [name], DO   Brief Narrative: 54 year old with past medical history significant for diabetes type 2, gastroparesis, recent and recurrent admissions for DKA, last [id number]/3500, chronic systolic heart failure, paroxysmal A. fib on Eliquis, CKD stage IIIb who presented with sudden onset nausea, vomiting and abdominal pain.  Patient was found to be in DKA.   Assessment & Plan:   Principal Problem:   DKA (diabetic ketoacidoses) ([place]) Active Problems:   Essential hypertension, benign   Uncontrolled type 2 diabetes mellitus with diabetic neuropathy, with long-term current use of insulin (HCC)   Hypokalemia   Diabetic gastroparesis (HCC)   Morbid obesity ([place])   Chronic systolic CHF (congestive heart failure) ([place])   1-DKA, uncontrolled diabetes: Type II Patient was admitted started on IV fluids, insulin drip. Patient presented with hyperglycemia anion gap at 19. Gap closed.  Back on long-acting, continue with sliding scale insulin. Add meals coverage. Increase lantus to 45 units.   2-Diabetic gastroparesis, abdominal pain: She continues to have abdominal pain and nausea and vomiting. She is intolerant to Reglan, she develop adverse effect from it.  Continue with supportive care. Continue to taper off dilaudid.  Start erythromycin.   3-leukocytosis: Reactive  4-Hypokalemia: resolved.   5-Chronic systolic heart failure: To metoprolol and hydralazine.  Continue to hold torsemide.  6-paroxysmal A. fib: Continue with Eliquis and Toprol 7-GERD;  Continue with PPI 8-hypomagnesemia: Replete IV AKI on CKD stage II; start IV fluids.      Estimated body mass index is 52.28 kg/m as calculated from the following:   Height as of this encounter: 5\' 6"  (1.676 m).   Weight as of this encounter: 146.9 kg.   DVT prophylaxis: Eliquis Code Status: Full code Family Communication:  Care discussed with patient Disposition Plan:  Patient is from: Home Anticipated d/c date: Home in 1 or 2 days after improvement of diabetes gastroparesis Barriers to d/c or necessity for inpatient status: Remain in the hospital, patient is still vomiting, complaining of abdominal pain and required IV pain medication.  Consultants:   None  Procedures:   None  Antimicrobials:   None Subjective: Pain better, still vomiting.   Objective: Vitals:   07/01/19 0439 07/01/19 0500 07/01/19 0810 07/01/19 1429  BP: 118/76  95/63 111/67  Pulse: 84  89 84  Resp: 17  18 18   Temp: 97.6 F (36.4 C)  97.7 F (36.5 C) 97.9 F (36.6 C)  TempSrc: Oral  Oral Oral  SpO2: 98%  95% 98%  Weight:  (!) 146.9 kg    Height:        Intake/Output Summary (Last 24 hours) at 07/01/2019 1521 Last data filed at 07/01/2019 1300 Gross per 24 hour  Intake 255 ml  Output --  Net 255 ml   Filed Weights   06/29/19 0915 07/01/19 0500  Weight: (!) 152 kg (!) 146.9 kg    Examination:  General exam: NAD Respiratory system: CTA Cardiovascular system: S 1, S 2 RRR Gastrointestinal system: BS present, soft, nt Central nervous system: Alert.  Extremities: Symmetric power.      Data Reviewed: I have personally reviewed following labs and imaging studies  CBC: Recent Labs  Lab 06/29/19 0928 06/30/19 0539  WBC 13.9* 12.4*  NEUTROABS 11.2*  --   HGB 10.6* 8.4*  HCT 33.5* 27.4*  MCV 84.4 87.3  PLT 427* 938   Basic Metabolic Panel: Recent Labs  Lab 06/29/19 0928 06/29/19  9417 06/29/19 1313 06/29/19 1722 06/30/19 0410 06/30/19 0539 06/30/19 1531 07/01/19 1244  NA 138   < > 137 140 138  --  137 136  K 3.0*   < > 3.3* 2.8* 2.8*  --  3.2* 3.6  CL 97*   < > 99 105 103  --  101 102  CO2 22   < > 24 26 26   --  22 26  GLUCOSE 433*   < > 390* 175* 173*  --  266* 254*  BUN 26*   < > 26* 23* 20  --  18 21*  CREATININE 1.47*   < > 1.50* 1.27* 1.30*  --  1.45* 1.85*  CALCIUM 9.2   < > 8.6* 7.8*  8.0*  --  8.3* 7.8*  MG 0.9*  --   --   --   --  1.3*  --  1.7   < > = values in this interval not displayed.   GFR: Estimated Creatinine Clearance: 51.1 mL/min (A) (by C-G formula based on SCr of 1.85 mg/dL (H)). Liver Function Tests: Recent Labs  Lab 06/29/19 0928  AST 19  ALT 16  ALKPHOS 122  BILITOT 0.8  PROT 8.0  ALBUMIN 3.5   Recent Labs  Lab 06/29/19 0928  LIPASE 39   No results for input(s): AMMONIA in the last 168 hours. Coagulation Profile: No results for input(s): INR, PROTIME in the last 168 hours. Cardiac Enzymes: No results for input(s): CKTOTAL, CKMB, CKMBINDEX, TROPONINI in the last 168 hours. BNP (last 3 results) No results for input(s): PROBNP in the last 8760 hours. HbA1C: No results for input(s): HGBA1C in the last 72 hours. CBG: Recent Labs  Lab 06/30/19 1213 06/30/19 1652 06/30/19 2132 07/01/19 0747 07/01/19 1157  GLUCAP 194* 252* 212* 222* 234*   Lipid Profile: No results for input(s): CHOL, HDL, LDLCALC, TRIG, CHOLHDL, LDLDIRECT in the last 72 hours. Thyroid Function Tests: No results for input(s): TSH, T4TOTAL, FREET4, T3FREE, THYROIDAB in the last 72 hours. Anemia Panel: No results for input(s): VITAMINB12, FOLATE, FERRITIN, TIBC, IRON, RETICCTPCT in the last 72 hours. Sepsis Labs: Recent Labs  Lab 06/29/19 1048 06/29/19 1313  LATICACIDVEN 4.5* 4.2*    Recent Results (from the past 240 hour(s))  Urine culture     Status: Abnormal   Collection Time: 06/22/19  7:52 PM   Specimen: Urine, Clean Catch  Result Value Ref Range Status   Specimen Description   Final    URINE, CLEAN CATCH Performed at [facility], [place] [address] [place], [place] [address]    Special Requests   Final    NONE Performed at [facility], [place] [address] [place], [place] [address]    Culture MULTIPLE SPECIES PRESENT, SUGGEST RECOLLECTION (A)  Final   Report Status 06/24/2019 FINAL  Final  SARS CORONAVIRUS 2 (TAT  6-24 HRS) Nasopharyngeal Nasopharyngeal Swab     Status: None   Collection Time: 06/23/19 12:46 AM   Specimen: Nasopharyngeal Swab  Result Value Ref Range Status   SARS Coronavirus 2 NEGATIVE NEGATIVE Final    Comment: (NOTE) SARS-CoV-2 target nucleic acids are NOT DETECTED. The SARS-CoV-2 RNA is generally detectable in upper and lower respiratory specimens during the acute phase of infection. Negative results do not preclude SARS-CoV-2 infection, do not rule out co-infections with other pathogens, and should not be used as the sole basis for treatment or other patient management decisions. Negative results must be combined with clinical observations, patient history,  and epidemiological information. The expected result is Negative. Fact Sheet for Patients: SugarRoll.be Fact Sheet for Healthcare Providers: https://www.woods-mathews.com/ This test is not yet approved or cleared by the [place] FDA and  has been authorized for detection and/or diagnosis of SARS-CoV-2 by FDA under an Emergency Use Authorization (EUA). This EUA will remain  in effect (meaning this test can be used) for the duration of the COVID-19 declaration under Section 56 4(b)(1) of the Act, 21 U.S.C. section 360bbb-3(b)(1), unless the authorization is terminated or revoked sooner. Performed at [facility], [place] [address], [place], [place] [address]   Respiratory Panel by RT PCR (Flu A&B, Covid) - Nasopharyngeal Swab     Status: None   Collection Time: 06/29/19 11:28 AM   Specimen: Nasopharyngeal Swab  Result Value Ref Range Status   SARS Coronavirus 2 by RT PCR NEGATIVE NEGATIVE Final    Comment: (NOTE) SARS-CoV-2 target nucleic acids are NOT DETECTED. The SARS-CoV-2 RNA is generally detectable in upper respiratoy specimens during the acute phase of infection. The lowest concentration of SARS-CoV-2 viral copies this assay can detect is 131 copies/mL. A  negative result does not preclude SARS-Cov-2 infection and should not be used as the sole basis for treatment or other patient management decisions. A negative result may occur with  improper specimen collection/handling, submission of specimen other than nasopharyngeal swab, presence of viral mutation(s) within the areas targeted by this assay, and inadequate number of viral copies (<131 copies/mL). A negative result must be combined with clinical observations, patient history, and epidemiological information. The expected result is Negative. Fact Sheet for Patients:  PinkCheek.be Fact Sheet for Healthcare Providers:  GravelBags.it This test is not yet ap proved or cleared by the [place] FDA and  has been authorized for detection and/or diagnosis of SARS-CoV-2 by FDA under an Emergency Use Authorization (EUA). This EUA will remain  in effect (meaning this test can be used) for the duration of the COVID-19 declaration under Section 564(b)(1) of the Act, 21 U.S.C. section 360bbb-3(b)(1), unless the authorization is terminated or revoked sooner.    Influenza A by PCR NEGATIVE NEGATIVE Final   Influenza B by PCR NEGATIVE NEGATIVE Final    Comment: (NOTE) The Xpert Xpress SARS-CoV-2/FLU/RSV assay is intended as an aid in  the diagnosis of influenza from Nasopharyngeal swab specimens and  should not be used as a sole basis for treatment. Nasal washings and  aspirates are unacceptable for Xpert Xpress SARS-CoV-2/FLU/RSV  testing. Fact Sheet for Patients: PinkCheek.be Fact Sheet for Healthcare Providers: GravelBags.it This test is not yet approved or cleared by the [place] FDA and  has been authorized for detection and/or diagnosis of SARS-CoV-2 by  FDA under an Emergency Use Authorization (EUA). This EUA will remain  in effect (meaning this test can be used) for  the duration of the  Covid-19 declaration under Section 564(b)(1) of the Act, 21  U.S.C. section 360bbb-3(b)(1), unless the authorization is  terminated or revoked. Performed at [facility], [place] [address] [place], [place] [address]          Radiology Studies: No results found.      Scheduled Meds: . amLODipine  10 mg Oral q morning - 10a  . apixaban  5 mg Oral BID  . DULoxetine  40 mg Oral BID  . erythromycin  250 mg Oral TID WC & HS  . hydrALAZINE  25 mg Oral TID  . insulin aspart  0-15 Units Subcutaneous TID WC  .  insulin aspart  0-5 Units Subcutaneous QHS  . insulin glargine  40 Units Subcutaneous Q24H  . melatonin  3 mg Oral QHS  . metoprolol succinate  25 mg Oral Daily  . pantoprazole (PROTONIX) IV  40 mg Intravenous Q12H   Continuous Infusions:   LOS: 1 day    Time spent: 35 minutes.     Elmarie Shiley, MD [facility]   If 7PM-7AM, please contact night-coverage [email or web address]  07/01/2019, 3:21 PM

## 2019-07-02 ENCOUNTER — Ambulatory Visit: Payer: Medicare Other | Admitting: Nurse Practitioner

## 2019-07-02 ENCOUNTER — Inpatient Hospital Stay (HOSPITAL_COMMUNITY): Payer: Medicare Other

## 2019-07-02 ENCOUNTER — Encounter (HOSPITAL_COMMUNITY): Payer: Self-pay | Admitting: Internal Medicine

## 2019-07-02 DIAGNOSIS — R072 Precordial pain: Secondary | ICD-10-CM

## 2019-07-02 LAB — GLUCOSE, CAPILLARY
Glucose-Capillary: 136 mg/dL — ABNORMAL HIGH (ref 70–99)
Glucose-Capillary: 209 mg/dL — ABNORMAL HIGH (ref 70–99)
Glucose-Capillary: 277 mg/dL — ABNORMAL HIGH (ref 70–99)
Glucose-Capillary: 247 mg/dL — ABNORMAL HIGH (ref 70–99)

## 2019-07-02 LAB — CBC
HCT: 26.7 % — ABNORMAL LOW (ref 36.0–46.0)
Hemoglobin: 8.1 g/dL — ABNORMAL LOW (ref 12.0–15.0)
MCH: 27.6 pg (ref 26.0–34.0)
MCHC: 30.3 g/dL (ref 30.0–36.0)
MCV: 90.8 fL (ref 80.0–100.0)
Platelets: 311 10*3/uL (ref 150–400)
RBC: 2.94 MIL/uL — ABNORMAL LOW (ref 3.87–5.11)
RDW: 14.8 % (ref 11.5–15.5)
WBC: 10.2 10*3/uL (ref 4.0–10.5)
nRBC: 0 % (ref 0.0–0.2)

## 2019-07-02 LAB — BASIC METABOLIC PANEL
Anion gap: 9 (ref 5–15)
BUN: 25 mg/dL — ABNORMAL HIGH (ref 6–20)
CO2: 24 mmol/L (ref 22–32)
Calcium: 8.2 mg/dL — ABNORMAL LOW (ref 8.9–10.3)
Chloride: 103 mmol/L (ref 98–111)
Creatinine, Ser: 1.85 mg/dL — ABNORMAL HIGH (ref 0.44–1.00)
GFR calc Af Amer: 35 mL/min — ABNORMAL LOW (ref >60)
GFR calc non Af Amer: 30 mL/min — ABNORMAL LOW (ref >60)
Glucose, Bld: 144 mg/dL — ABNORMAL HIGH (ref 70–99)
Potassium: 3.3 mmol/L — ABNORMAL LOW (ref 3.5–5.1)
Sodium: 136 mmol/L (ref 135–145)

## 2019-07-02 LAB — BRAIN NATRIURETIC PEPTIDE: B Natriuretic Peptide: 61.4 pg/mL (ref 0.0–100.0)

## 2019-07-02 LAB — TROPONIN I (HIGH SENSITIVITY)
Troponin I (High Sensitivity): 19 ng/L — ABNORMAL HIGH (ref <18)
Troponin I (High Sensitivity): 17 ng/L (ref <18)

## 2019-07-02 MED ORDER — ISOSORBIDE MONONITRATE ER 60 MG PO TB24
60.0000 mg | ORAL_TABLET | Freq: Every day | ORAL | Status: DC
  Administered 2019-07-02 – 2019-07-04 (×3): 60 mg via ORAL
  Filled 2019-07-02 (×3): qty 1

## 2019-07-02 MED ORDER — SODIUM CHLORIDE 0.9 % IV SOLN: INTRAVENOUS | Status: DC

## 2019-07-02 NOTE — Progress Notes (Signed)
Pt requested to stop IVF. Pt was educated , approached a few times, still refusing, getting upset and agitated.

## 2019-07-02 NOTE — Progress Notes (Signed)
PROGRESS NOTE    Deborah [name]  OXB:[id number] DOB: 1965/10/07 DOA: 06/29/2019 PCP: [name], DO   Brief Narrative: 54 year old with past medical history significant for diabetes type 2, gastroparesis, recent and recurrent admissions for DKA, last [id number]/7989, chronic systolic heart failure, paroxysmal A. fib on Eliquis, CKD stage IIIb who presented with sudden onset nausea, vomiting and abdominal pain.  Patient was found to be in DKA.   Assessment & Plan:   Principal Problem:   DKA (diabetic ketoacidoses) ([place]) Active Problems:   Essential hypertension, benign   Uncontrolled type 2 diabetes mellitus with diabetic neuropathy, with long-term current use of insulin (HCC)   Hypokalemia   Diabetic gastroparesis (HCC)   Morbid obesity ([place])   Chronic systolic CHF (congestive heart failure) ([place])   1-DKA, uncontrolled diabetes: Type II Patient was admitted started on IV fluids, insulin drip. Patient presented with hyperglycemia anion gap at 19. Gap closed.  Back on long-acting, continue with sliding scale insulin. Added meals coverage. Increase lantus to 45 units.   2-Diabetic gastroparesis, abdominal pain: She continues to have abdominal pain and nausea and vomiting. She is intolerant to Reglan, she develop adverse effect from it.  Continue with supportive care. Continue to taper off dilaudid.  Started  erythromycin. No further vomiting, still nauseous.   Chest pressure, pain;  Check EKG has new T wave inversion V 2.  Cycle troponin.  Nitroglycerin.  Cardiology consult.  Chest x ray negative for pulmonary edema.   3-AKI on CKD stage II; came off fluids last night, per patient request.  Resume IV fluids.  Strict I and o  4-Hypokalemia: resolved.   5-Chronic systolic heart failure: To metoprolol and hydralazine.  Continue to hold torsemide.  6-paroxysmal A. fib: Continue with Eliquis and Toprol 7-GERD;  Continue with PPI 8-hypomagnesemia: Replete  IV  leukocytosis: Reactive   Estimated body mass index is 52.28 kg/m as calculated from the following:   Height as of this encounter: 5\' 6"  (1.676 m).   Weight as of this encounter: 146.9 kg.   DVT prophylaxis: Eliquis Code Status: Full code Family Communication: Care discussed with patient Disposition Plan:  Patient is from: Home Anticipated d/c date: Home in 1 or 2 days after improvement of diabetes gastroparesis and chest pain  Barriers to d/c or necessity for inpatient status: Requiring IV fluids for AKI, and work up for chest pain   Consultants:   None  Procedures:   None  Antimicrobials:   None Subjective: Report no vomiting, still very nauseous.  Complaining of chest pain, pressure.    Objective: Vitals:   07/01/19 2230 07/02/19 0516 07/02/19 1019 07/02/19 1216  BP: 119/81 120/68 128/79 126/71  Pulse: 87 86 91   Resp: 19 18    Temp: (!) 97.4 F (36.3 C) 98.1 F (36.7 C)    TempSrc: Oral Oral    SpO2: 99% 99% 100% 99%  Weight:      Height:        Intake/Output Summary (Last 24 hours) at 07/02/2019 1404 Last data filed at 07/02/2019 0600 Gross per 24 hour  Intake 622.5 ml  Output --  Net 622.5 ml   Filed Weights   06/29/19 0915 07/01/19 0500  Weight: (!) 152 kg (!) 146.9 kg    Examination:  General exam: NAD Respiratory system: CTA Cardiovascular system: S 1, S 2 RRR Gastrointestinal system: BS present, soft, nt Central nervous system: Alert.  Extremities: Symmetric power.      Data Reviewed: I have personally reviewed  following labs and imaging studies  CBC: Recent Labs  Lab 06/29/19 0928 06/30/19 0539 07/02/19 0937  WBC 13.9* 12.4* 10.2  NEUTROABS 11.2*  --   --   HGB 10.6* 8.4* 8.1*  HCT 33.5* 27.4* 26.7*  MCV 84.4 87.3 90.8  PLT 427* 334 081   Basic Metabolic Panel: Recent Labs  Lab 06/29/19 0928 06/29/19 1313 06/29/19 1722 06/30/19 0410 06/30/19 0539 06/30/19 1531 07/01/19 1244 07/02/19 0937  NA 138   < > 140  138  --  137 136 136  K 3.0*   < > 2.8* 2.8*  --  3.2* 3.6 3.3*  CL 97*   < > 105 103  --  101 102 103  CO2 22   < > 26 26  --  22 26 24   GLUCOSE 433*   < > 175* 173*  --  266* 254* 144*  BUN 26*   < > 23* 20  --  18 21* 25*  CREATININE 1.47*   < > 1.27* 1.30*  --  1.45* 1.85* 1.85*  CALCIUM 9.2   < > 7.8* 8.0*  --  8.3* 7.8* 8.2*  MG 0.9*  --   --   --  1.3*  --  1.7  --    < > = values in this interval not displayed.   GFR: Estimated Creatinine Clearance: 51.1 mL/min (A) (by C-G formula based on SCr of 1.85 mg/dL (H)). Liver Function Tests: Recent Labs  Lab 06/29/19 0928  AST 19  ALT 16  ALKPHOS 122  BILITOT 0.8  PROT 8.0  ALBUMIN 3.5   Recent Labs  Lab 06/29/19 0928  LIPASE 39   No results for input(s): AMMONIA in the last 168 hours. Coagulation Profile: No results for input(s): INR, PROTIME in the last 168 hours. Cardiac Enzymes: No results for input(s): CKTOTAL, CKMB, CKMBINDEX, TROPONINI in the last 168 hours. BNP (last 3 results) No results for input(s): PROBNP in the last 8760 hours. HbA1C: No results for input(s): HGBA1C in the last 72 hours. CBG: Recent Labs  Lab 07/01/19 1157 07/01/19 1800 07/01/19 2226 07/02/19 0807 07/02/19 1248  GLUCAP 234* 270* 240* 136* 209*   Lipid Profile: No results for input(s): CHOL, HDL, LDLCALC, TRIG, CHOLHDL, LDLDIRECT in the last 72 hours. Thyroid Function Tests: No results for input(s): TSH, T4TOTAL, FREET4, T3FREE, THYROIDAB in the last 72 hours. Anemia Panel: No results for input(s): VITAMINB12, FOLATE, FERRITIN, TIBC, IRON, RETICCTPCT in the last 72 hours. Sepsis Labs: Recent Labs  Lab 06/29/19 1048 06/29/19 1313  LATICACIDVEN 4.5* 4.2*    Recent Results (from the past 240 hour(s))  Urine culture     Status: Abnormal   Collection Time: 06/22/19  7:52 PM   Specimen: Urine, Clean Catch  Result Value Ref Range Status   Specimen Description   Final    URINE, CLEAN CATCH Performed at [facility], [place] [address] [place], [place] [address]    Special Requests   Final    NONE Performed at [facility], [place] [address] [place], [place] [address]    Culture MULTIPLE SPECIES PRESENT, SUGGEST RECOLLECTION (A)  Final   Report Status 06/24/2019 FINAL  Final  SARS CORONAVIRUS 2 (TAT 6-24 HRS) Nasopharyngeal Nasopharyngeal Swab     Status: None   Collection Time: 06/23/19 12:46 AM   Specimen: Nasopharyngeal Swab  Result Value Ref Range Status   SARS Coronavirus 2 NEGATIVE NEGATIVE Final    Comment: (NOTE) SARS-CoV-2 target nucleic  acids are NOT DETECTED. The SARS-CoV-2 RNA is generally detectable in upper and lower respiratory specimens during the acute phase of infection. Negative results do not preclude SARS-CoV-2 infection, do not rule out co-infections with other pathogens, and should not be used as the sole basis for treatment or other patient management decisions. Negative results must be combined with clinical observations, patient history, and epidemiological information. The expected result is Negative. Fact Sheet for Patients: SugarRoll.be Fact Sheet for Healthcare Providers: https://www.woods-mathews.com/ This test is not yet approved or cleared by the [place] FDA and  has been authorized for detection and/or diagnosis of SARS-CoV-2 by FDA under an Emergency Use Authorization (EUA). This EUA will remain  in effect (meaning this test can be used) for the duration of the COVID-19 declaration under Section 56 4(b)(1) of the Act, 21 U.S.C. section 360bbb-3(b)(1), unless the authorization is terminated or revoked sooner. Performed at [facility], [place] [address] [place], [place] [address]   Respiratory Panel by RT PCR (Flu A&B, Covid) - Nasopharyngeal Swab     Status: None   Collection Time: 06/29/19 11:28 AM   Specimen: Nasopharyngeal Swab  Result Value Ref Range Status   SARS Coronavirus  2 by RT PCR NEGATIVE NEGATIVE Final    Comment: (NOTE) SARS-CoV-2 target nucleic acids are NOT DETECTED. The SARS-CoV-2 RNA is generally detectable in upper respiratoy specimens during the acute phase of infection. The lowest concentration of SARS-CoV-2 viral copies this assay can detect is 131 copies/mL. A negative result does not preclude SARS-Cov-2 infection and should not be used as the sole basis for treatment or other patient management decisions. A negative result may occur with  improper specimen collection/handling, submission of specimen other than nasopharyngeal swab, presence of viral mutation(s) within the areas targeted by this assay, and inadequate number of viral copies (<131 copies/mL). A negative result must be combined with clinical observations, patient history, and epidemiological information. The expected result is Negative. Fact Sheet for Patients:  PinkCheek.be Fact Sheet for Healthcare Providers:  GravelBags.it This test is not yet ap proved or cleared by the [place] FDA and  has been authorized for detection and/or diagnosis of SARS-CoV-2 by FDA under an Emergency Use Authorization (EUA). This EUA will remain  in effect (meaning this test can be used) for the duration of the COVID-19 declaration under Section 564(b)(1) of the Act, 21 U.S.C. section 360bbb-3(b)(1), unless the authorization is terminated or revoked sooner.    Influenza A by PCR NEGATIVE NEGATIVE Final   Influenza B by PCR NEGATIVE NEGATIVE Final    Comment: (NOTE) The Xpert Xpress SARS-CoV-2/FLU/RSV assay is intended as an aid in  the diagnosis of influenza from Nasopharyngeal swab specimens and  should not be used as a sole basis for treatment. Nasal washings and  aspirates are unacceptable for Xpert Xpress SARS-CoV-2/FLU/RSV  testing. Fact Sheet for Patients: PinkCheek.be Fact Sheet for Healthcare  Providers: GravelBags.it This test is not yet approved or cleared by the [place] FDA and  has been authorized for detection and/or diagnosis of SARS-CoV-2 by  FDA under an Emergency Use Authorization (EUA). This EUA will remain  in effect (meaning this test can be used) for the duration of the  Covid-19 declaration under Section 564(b)(1) of the Act, 21  U.S.C. section 360bbb-3(b)(1), unless the authorization is  terminated or revoked. Performed at [facility], [place] [address] [place], [place] [address]          Radiology Studies: DG CHEST  PORT 1 VIEW  Result Date: 07/02/2019 CLINICAL DATA:  Dyspnea, history acute on chronic heart failure, hypertension, paroxysmal atrial fibrillation EXAM: PORTABLE CHEST 1 VIEW COMPARISON:  Portable exam 1316 hours compared to 06/29/2019 FINDINGS: Upper normal heart size. Mediastinal contours and pulmonary vascularity normal. Lungs clear. No infiltrate, pleural effusion or pneumothorax. Bones demineralized with RIGHT glenohumeral degenerative changes noted. IMPRESSION: No acute abnormalities. Electronically Signed   By: Lavonia Dana M.D.   On: 07/02/2019 13:55        Scheduled Meds:  amLODipine  10 mg Oral q morning - 10a   apixaban  5 mg Oral BID   DULoxetine  40 mg Oral BID   erythromycin  250 mg Oral TID WC & HS   hydrALAZINE  25 mg Oral TID   insulin aspart  0-15 Units Subcutaneous TID WC   insulin aspart  0-5 Units Subcutaneous QHS   insulin aspart  3 Units Subcutaneous TID WC   insulin glargine  45 Units Subcutaneous Q24H   melatonin  3 mg Oral QHS   metoprolol succinate  25 mg Oral Daily   pantoprazole (PROTONIX) IV  40 mg Intravenous Q12H   Continuous Infusions:  sodium chloride       LOS: 2 days    Time spent: 35 minutes.     Elmarie Shiley, MD [facility]   If 7PM-7AM, please contact night-coverage [email or web address]  07/02/2019, 2:04 PM

## 2019-07-02 NOTE — TOC Initial Note (Signed)
Transition of Care [facility]) - Initial/Assessment Note    Patient Details  Name: Deborah [name] MRN: [id number] Date of Birth: May 08, 1965  Transition of [facility]) CM/SW Contact:    Joaquin Courts, RN Phone Number: 07/02/2019, 11:02 AM  Clinical Narrative:   Patient admitted from home with DKA.  Anticipated discharge home once medically stable with no CM needs identified.                  Expected Discharge Plan: Home/Self Care Barriers to Discharge: Continued Medical Work up   Patient Goals and CMS Choice Patient states their goals for this hospitalization and ongoing recovery are:: to get better      Expected Discharge Plan and Services Expected Discharge Plan: Home/Self Care       Living arrangements for the past 2 months: Single Family Home Expected Discharge Date: (unknown)                                    Prior Living Arrangements/Services Living arrangements for the past 2 months: Single Family Home Lives with:: Parents Patient language and need for interpreter reviewed:: Yes Do you feel safe going back to the place where you live?: Yes      Need for Family Participation in Patient Care: No (Comment) Care giver support system in place?: Yes (comment)   Criminal Activity/Legal Involvement Pertinent to Current Situation/Hospitalization: No - Comment as needed  Activities of Daily Living Home Assistive Devices/Equipment: CBG Meter, Nebulizer, Wheelchair ADL Screening (condition at time of admission) Patient's cognitive ability adequate to safely complete daily activities?: Yes Is the patient deaf or have difficulty hearing?: No Does the patient have difficulty seeing, even when wearing glasses/contacts?: No Does the patient have difficulty concentrating, remembering, or making decisions?: No Patient able to express need for assistance with ADLs?: Yes Does the patient have difficulty dressing or bathing?: No Independently performs ADLs?: Yes  (appropriate for developmental age) Does the patient have difficulty walking or climbing stairs?: Yes(secondary to shortness of breath) Weakness of Legs: Both Weakness of Arms/Hands: None  Permission Sought/Granted                  Emotional Assessment       Orientation: : Oriented to Place, Oriented to Self, Oriented to  Time, Oriented to Situation   Psych Involvement: No (comment)  Admission diagnosis:  Gastroparesis [K31.84] Hypokalemia [E87.6] Lactic acidosis [E87.2] Hypomagnesemia [E83.42] DKA (diabetic ketoacidoses) ([place]) [E11.10] Diabetic ketoacidosis without coma associated with type 2 diabetes mellitus ([place]) [E11.10] Patient Active Problem List   Diagnosis Date Noted  . Nonalcoholic fatty liver disease 06/05/2019  . Chronic diarrhea   . COPD exacerbation ([place]) 05/08/2019  . Acute on chronic HFrEF (heart failure with reduced ejection fraction) ([place])   . High risk social situation   . Restrictive lung disease secondary to obesity   . Physical deconditioning   . [name] 05/07/2019  . History of gastric ulcer   . Uncontrolled type 2 diabetes mellitus with hyperglycemia ([place])   . Fibroid uterus 02/23/2019  . Congestion of nasal sinus 01/24/2019  . Chronic systolic CHF (congestive heart failure) ([place]) 12/19/2018  . Intractable nausea and vomiting 12/17/2018  . Hypoxia 12/17/2018  . Hyperglycemia 12/17/2018  . Elevated troponin I level   . Vomiting 07/18/2018  . Abdominal pain 07/17/2018  . Hyperkalemia 07/17/2018  . Cardiac arrest ([place]) 11/29/2017  . Pelvic mass  11/29/2017  . Leukocytosis 11/29/2017  . Anxiety 11/29/2017  . Allergic reaction to contrast media, initial encounter 11/28/2017  . Palliative care encounter   . Back pain 03/19/2017  . Stroke (cerebrum) ([place]) 03/19/2017  . GERD (gastroesophageal reflux disease) 03/19/2017  . Depression 03/19/2017  . Morbid obesity ([place])   . Urinary tract infection 08/16/2016  . Normocytic  normochromic anemia 08/16/2016  . Gastroparesis 08/16/2016  . Intractable vomiting with nausea 06/17/2016  . Diabetic gastroparesis ([place]) 06/05/2016  . Gout 06/05/2016  . AKI (acute kidney injury) ([place]) 12/06/2015  . Chest pain 09/26/2015  . Hypokalemia 09/26/2015  . Hypomagnesemia 09/26/2015  . Nausea and vomiting 08/20/2015  . Gout flare 05/27/2015  . Lower abdominal pain 05/26/2015  . DKA (diabetic ketoacidoses) ([place]) 05/25/2015  . Uncontrolled type 2 diabetes mellitus with diabetic neuropathy, with long-term current use of insulin ([place]) 05/25/2015  . Type 2 diabetes mellitus with hyperglycemia, with long-term current use of insulin ([place]) 05/25/2015  . CKD (chronic kidney disease), stage II 05/25/2015  . Essential hypertension, benign 09/28/2013   PCP:  [name], DO Pharmacy:   [place], [place] - [address] [place] [address] Phone: [phone] Fax: [phone]     Social Determinants of Health (SDOH) Interventions    Readmission Risk Interventions Readmission Risk Prevention Plan 07/02/2019 06/25/2019 01/29/2019  Transportation Screening Complete Complete Complete  PCP or Specialist Appt within 3-5 Days - - Complete  Not Complete comments - - -  [place] or [place] - - Complete  Social Work Consult for [place] Planning/Counseling - - Complete  Palliative Care Screening - - Not Applicable  Medication Review Press photographer) Complete Complete Complete  PCP or Specialist appointment within 3-5 days of discharge - Complete -  [place] or [place] Complete - -  SW Recovery Care/Counseling Consult Complete Complete -  Palliative Care Screening Not Applicable Not Applicable -  [place] Not Applicable Not Applicable -  Some recent data might be hidden

## 2019-07-02 NOTE — Consult Note (Addendum)
Cardiology Consultation:   Patient ID: CHEMIKA NIGHTENGALE MRN: [id number]; DOB: 04-27-1965  Admit date: 06/29/2019 Date of Consult: 07/02/2019  Primary Care Provider: [name], DO Primary Cardiologist: Fransico Him, MD  Primary Electrophysiologist:  None    Patient Profile:   Deborah [name] is a 54 y.o. female with a hx of HTN, gastroparesis, DM, obesity hx of CVA-2012, CKD-2, fibromyalgia, chronic pain and PAF who is being seen today for the evaluation of Chest pain at the request of Dr. Tyrell Antonio.  History of Present Illness:   Deborah [name] with above hx and in 2017  lexiscan small questionable complete infarct/scar vs artifact in the anterior lateral wall, no reversible ischemia. It was thought her CP was due to gastroparesis and anxiety.  In 2019 she had cath at [facility] and found normal coronary arteries. Echo at that time revealed EF of 45-50%.     01/2019  Lexiscan myoview on 01/27/19 showed medium defect of moderate severity in the anterolateral and apical anterior location felt to be consistent with breast attenuation vs chest wall artifact. Stress test read as intermediate risk due to EF of 36%. Follow up echocardiogram revealed EF of 40-45% with global hypokinesis without focal coronary distribution  03/2018 she had a fib and converted to SR after po lopressor. 03/13/19 a fib with RVR eliquis added.  Chronic systolic HF  She never followed up in office.      Has had several admits for gastroparesis and or DKA.  Most recent 06/22/19 to 06/26/19 -she has remained on eliquis for PAF   Presented 4/23/21with chest pain SOB and diaphoresis and weeping of her lower ext.  She is admitted with DKA, and gastroparesis.  Eliquis continued and toprol.  At home on torsemide and no diuretic currently with DKA Today she complained of chest pain she had 2 SL NTG and it improved.     no I&O done   EKG:  The EKG was personally reviewed and demonstrates:  SR and review of  several EKGS with the least artifact no acute ST changes.   Telemetry:  Telemetry was personally reviewed and demonstrates:  SR     Troponin 19 and 17, BNP 61  Na 136, K+ 3.3  Up from 2.8, BUN 25, Cr 1.85  Hgb 8.1 WBC 10.2 plts 311  CXR no acute abnormalities.    Past Medical History:  Diagnosis Date  . Acute back pain with sciatica, left   . Acute back pain with sciatica, right   . Acute on chronic congestive heart failure ([place])   . AKI (acute kidney injury) ([place])   . Anemia, unspecified   . Chest pain 12/2015  . Chronic pain   . Chronic systolic CHF (congestive heart failure) ([place])   . Cough   . Diabetes mellitus   . Esophageal reflux   . Fibromyalgia   . Flu-like symptoms 04/05/2019  . Gastric ulcer   . Gastroparesis   . Gout   . Hyperlipidemia   . Hypertension   . Hypokalemia   . Hypomagnesemia   . Lumbosacral stenosis   . NICM (nonischemic cardiomyopathy) ([place])   . Obesity   . PAF (paroxysmal atrial fibrillation) ([place])   . Pneumonia   . Stroke ([place]) 02/2011  . Vitamin B12 deficiency anemia     Past Surgical History:  Procedure Laterality Date  . CATARACT EXTRACTION  01/2014  . CHOLECYSTECTOMY       Home Medications:  Prior to Admission medications   Medication  Sig Start Date End Date Taking? Authorizing Provider  albuterol (PROVENTIL) (2.5 MG/3ML) 0.083% nebulizer solution Take 3 mLs (2.5 mg total) by nebulization every 6 (six) hours as needed for wheezing or shortness of breath. 04/06/19  Yes Scot Jun, FNP  amLODipine (NORVASC) 10 MG tablet Take 10 mg by mouth every morning. 04/19/19  Yes [provider]  apixaban (ELIQUIS) 5 MG TABS tablet Take 1 tablet (5 mg total) by mouth 2 (two) times daily. 05/09/19  Yes Gifford Shave, MD  atorvastatin (LIPITOR) 10 MG tablet Take 1 tablet (10 mg total) by mouth daily. 12/13/18  Yes Lockamy, Christia Reading, DO  cetirizine (ZYRTEC) 10 MG tablet Take 1 tablet (10 mg total) by mouth daily. 12/13/18  Yes Lockamy,  Timothy, DO  DULoxetine HCl 40 MG CPEP TAKE ONE CAPSULE BY MOUTH TWICE DAILY Patient taking differently: Take 40 mg by mouth in the morning and at bedtime.  06/04/19  Yes Lockamy, Timothy, DO  fluticasone (FLONASE) 50 MCG/ACT nasal spray Place 2 sprays into both nostrils daily as needed for allergies or rhinitis. 12/19/18  Yes Rai, Ripudeep K, MD  hydrALAZINE (APRESOLINE) 25 MG tablet TAKE ONE TABLET BY MOUTH THREE TIMES A DAY. FOR BLOOD PRESSURE Patient taking differently: Take 25 mg by mouth 3 (three) times daily. For blood pressure 04/19/19  Yes Lockamy, Timothy, DO  insulin aspart (NOVOLOG FLEXPEN) 100 UNIT/ML FlexPen Inject 15 Units into the skin 3 (three) times daily with meals. Patient taking differently: Inject 30 Units into the skin 3 (three) times daily with meals.  06/26/19  Yes Mariel Aloe, MD  LANTUS SOLOSTAR 100 UNIT/ML Solostar Pen Inject 45 Units into the skin at bedtime. 06/14/19  Yes Pokhrel, Laxman, MD  loperamide (IMODIUM) 2 MG capsule Take 2 mg by mouth 4 (four) times daily as needed for diarrhea or loose stools.   Yes [provider]  magnesium oxide (MAG-OX) 400 (241.3 Mg) MG tablet Take 1 tablet (400 mg total) by mouth 2 (two) times daily. 06/14/19  Yes Pokhrel, Laxman, MD  Melatonin 3 MG TABS Take 2 tablets (6 mg total) by mouth at bedtime. 12/13/18  Yes Lockamy, Timothy, DO  metoCLOPramide (REGLAN) 10 MG tablet Take 1 tablet (10 mg total) by mouth 3 (three) times daily before meals. 12/19/18  Yes Rai, Ripudeep K, MD  metoprolol succinate (TOPROL-XL) 25 MG 24 hr tablet Take 25 mg by mouth daily. 06/01/19  Yes [provider]  nitroGLYCERIN (NITROSTAT) 0.4 MG SL tablet Place 1 tablet (0.4 mg total) under the tongue every 5 (five) minutes as needed for chest pain. 12/13/18  Yes Lockamy, Timothy, DO  ondansetron (ZOFRAN-ODT) 4 MG disintegrating tablet Take 4 mg by mouth every 8 (eight) hours as needed for nausea or vomiting.  06/07/19  Yes [provider]    pantoprazole (PROTONIX) 40 MG tablet Take 1 tablet (40 mg total) by mouth 2 (two) times daily. 03/17/19 06/29/19 Yes Welborn, Ryan, DO  torsemide (DEMADEX) 20 MG tablet Take 2 tablets (40 mg total) by mouth daily. Hold if having significant vomiting 06/26/19  Yes Mariel Aloe, MD  blood glucose meter kit and supplies KIT Dispense based on patient and insurance preference. Use up to four times daily as directed. (FOR ICD-9 250.00, 250.01). 12/13/18   [name], DO  dicyclomine (BENTYL) 20 MG tablet TAKE 1 TABLET (20 MG TOTAL) BY MOUTH 3 (THREE) TIMES DAILY AS NEEDED FOR SPASMS (ABDOMINAL CRAMPING). Patient not taking: Reported on 06/29/2019 06/22/19 07/22/19  [name], DO  Inpatient Medications: Scheduled Meds: . amLODipine  10 mg Oral q morning - 10a  . apixaban  5 mg Oral BID  . DULoxetine  40 mg Oral BID  . erythromycin  250 mg Oral TID WC & HS  . hydrALAZINE  25 mg Oral TID  . insulin aspart  0-15 Units Subcutaneous TID WC  . insulin aspart  0-5 Units Subcutaneous QHS  . insulin aspart  3 Units Subcutaneous TID WC  . insulin glargine  45 Units Subcutaneous Q24H  . melatonin  3 mg Oral QHS  . metoprolol succinate  25 mg Oral Daily  . pantoprazole (PROTONIX) IV  40 mg Intravenous Q12H   Continuous Infusions:  PRN Meds: acetaminophen **OR** acetaminophen, albuterol, dextrose, fluticasone, HYDROmorphone, nitroGLYCERIN, ondansetron **OR** ondansetron (ZOFRAN) IV  Allergies:    Allergies  Allergen Reactions  . Contrast Media [Iodinated Diagnostic Agents] Anaphylaxis    Cardiac arrest  . Diazepam [name]  . Isovue [Iopamidol] Anaphylaxis    Patient had seizure like activity and then code post 100 cc of isovue 300  . Lisinopril Anaphylaxis    Tongue and mouth swelling  . Penicillins Palpitations    Has patient had a PCN reaction causing immediate rash, facial/tongue/throat swelling, SOB or lightheadedness with hypotension: Yes, heart races Has patient had a  PCN reaction causing severe rash involving mucus membranes or skin necrosis: No Has patient had a PCN reaction that required hospitalization: Yes  Has patient had a PCN reaction occurring within the last 10 years: No   . Acetaminophen Nausea Only and Other (See Comments)    Irritates stomach ulcer  Abdominal pain  . Tolmetin Nausea Only and Other (See Comments)    ULCER  . Aspirin Other (See Comments)    Irritates stomach ulcer   . Food Hives and Swelling    Reaction to Carrots, ketchup   . Nsaids Other (See Comments)    ULCER  . Tramadol Nausea And Vomiting    Social History:   Social History   Socioeconomic History  . Marital status: Single    Spouse name: Not on file  . Number of children: Not on file  . Years of education: Not on file  . Highest education level: Not on file  Occupational History  . Not on file  Tobacco Use  . Smoking status: Never Smoker  . Smokeless tobacco: Never Used  Substance and Sexual Activity  . Alcohol use: No  . Drug use: No  . Sexual activity: Not Currently    Birth control/protection: None  Other Topics Concern  . Not on file  Social History Narrative   ** Merged History Encounter **       Social Determinants of Health   Financial Resource Strain:   . Difficulty of Paying Living Expenses:   Food Insecurity:   . Worried About Charity fundraiser in the Last Year:   . Arboriculturist in the Last Year:   Transportation Needs:   . Film/video editor (Medical):   [name] Lack of Transportation (Non-Medical):   Physical Activity:   . Days of Exercise per Week:   . Minutes of Exercise per Session:   Stress:   . Feeling of Stress :   Social Connections:   . Frequency of Communication with Friends and Family:   . Frequency of Social Gatherings with Friends and Family:   . Attends Religious Services:   . Active Member of Clubs or Organizations:   . Attends  Club or Organization Meetings:   [name] Marital Status:   Intimate Partner  Violence:   . Fear of Current or Ex-Partner:   . Emotionally Abused:   [name] Physically Abused:   . Sexually Abused:     Family History:    Family History  Problem Relation Age of Onset  . Diabetes Mother   . Diabetes Father   . Heart disease Father   . Diabetes Sister   . Congestive Heart Failure Sister 74  . Diabetes Brother      ROS:  Please see the history of present illness.  General:no colds or fevers, no weight changes Skin:no rashes or ulcers HEENT:no blurred vision, no congestion CV:see HPI PUL:see HPI GI:no diarrhea constipation or melena, no indigestion, + gastroparesis  GU:no hematuria, no dysuria MS:no joint pain, no claudication Neuro:no syncope, no lightheadedness Endo:+ diabetes uncontrolled, no thyroid disease  All other ROS reviewed and negative.     Physical Exam/Data:   Vitals:   07/01/19 2230 07/02/19 0516 07/02/19 1019 07/02/19 1216  BP: 119/81 120/68 128/79 126/71  Pulse: 87 86 91   Resp: 19 18    Temp: (!) 97.4 F (36.3 C) 98.1 F (36.7 C)    TempSrc: Oral Oral    SpO2: 99% 99% 100% 99%  Weight:      Height:        Intake/Output Summary (Last 24 hours) at 07/02/2019 1402 Last data filed at 07/02/2019 0600 Gross per 24 hour  Intake 622.5 ml  Output --  Net 622.5 ml   Last 3 Weights 07/01/2019 06/29/2019 06/23/2019  Weight (lbs) 323 lb 14.4 oz 335 lb 335 lb  Weight (kg) 146.92 kg 151.955 kg 151.955 kg     Body mass index is 52.28 kg/m.  General:  Well nourished, well developed, in no acute distress HEENT: normal Lymph: no adenopathy Neck: no JVD Endocrine:  No thryomegaly Vascular: No carotid bruits; pedal pulses 2+ bilaterally   Cardiac:  normal S1, S2; RRR; no murmur gallup rub or click Lungs:  Clear to diminished to auscultation bilaterally, no wheezing, rhonchi or rales  Abd: soft, nontender, no hepatomegaly  Ext: no pitting edema Musculoskeletal:  No deformities, BUE and BLE strength normal and equal Skin: warm and dry   Neuro:  Alert and oriented X 3 MAE, follows commands, no focal abnormalities noted Psych:  Normal affect    Relevant CV Studies: Echo 01/26/19: 1. Left ventricular ejection fraction, by visual estimation, is 40 to 45%. The left ventricle has moderately decreased function. There is no left ventricular hypertrophy. 2. The left ventricle demonstrates global hypokinesis. 3. Global right ventricle has normal systolic function.The right ventricular size is normal. No increase in right ventricular wall thickness. 4. EF moderately reduced, new compared to prior. Global hypokinesis, not in a focal coronary distribution. 5. Left atrial size was normal. 6. Right atrial size was normal. 7. Small pericardial effusion. 8. The mitral valve is normal in structure. Trace mitral valve regurgitation. 9. The tricuspid valve is normal in structure. Tricuspid valve regurgitation is trivial. 10. The aortic valve is tricuspid. Aortic valve regurgitation is not visualized. Mild aortic valve sclerosis without stenosis. 11. The pulmonic valve was grossly normal. Pulmonic valve regurgitation is not visualized. 12. TR signal is inadequate for assessing pulmonary artery systolic pressure. 13. The inferior vena cava is dilated in size with >50% respiratory variability, suggesting right atrial pressure of 8 mmHg.   Lexiscan myoview 01/27/19:  There was no ST segment deviation noted during stress.  No T wave inversion was noted during stress.  Defect 1: There is a medium defect of moderate severity present in the basal anterolateral, mid anterior, mid anterolateral and apical anterior location. This appears to be most c/w breast / chest wall artifact.  This is an intermediate risk study.  Nuclear stress EF: 36%. The left ventricular ejection fraction is moderately decreased (30-44%).   Cardiac catheterization 09/2017 - normal coronaries   Laboratory Data:  High Sensitivity Troponin:   Recent Labs   Lab 06/22/19 2123 06/22/19 2321 06/29/19 0928 06/29/19 1128 07/02/19 0937  TROPONINIHS 19* 20* 28* 24* 19*     Chemistry Recent Labs  Lab 06/30/19 1531 07/01/19 1244 07/02/19 0937  NA 137 136 136  K 3.2* 3.6 3.3*  CL 101 102 103  CO2 '22 26 24  ' GLUCOSE 266* 254* 144*  BUN 18 21* 25*  CREATININE 1.45* 1.85* 1.85*  CALCIUM 8.3* 7.8* 8.2*  GFRNONAA 40* 30* 30*  GFRAA 47* 35* 35*  ANIONGAP '14 8 9    ' Recent Labs  Lab 06/29/19 0928  PROT 8.0  ALBUMIN 3.5  AST 19  ALT 16  ALKPHOS 122  BILITOT 0.8   Hematology Recent Labs  Lab 06/29/19 0928 06/30/19 0539 07/02/19 0937  WBC 13.9* 12.4* 10.2  RBC 3.97 3.14* 2.94*  HGB 10.6* 8.4* 8.1*  HCT 33.5* 27.4* 26.7*  MCV 84.4 87.3 90.8  MCH 26.7 26.8 27.6  MCHC 31.6 30.7 30.3  RDW 14.8 14.9 14.8  PLT 427* 334 311   BNPNo results for input(s): BNP, PROBNP in the last 168 hours.  DDimer No results for input(s): DDIMER in the last 168 hours.   Radiology/Studies:  DG Chest 2 View  Result Date: 06/29/2019 CLINICAL DATA:  Chest pain and shortness of breath. Diaphoresis. EXAM: CHEST - 2 VIEW COMPARISON:  06/22/2019 FINDINGS: Borderline enlarged cardiac silhouette. Clear lungs with normal vascularity. Thoracic spine degenerative changes. IMPRESSION: Borderline cardiomegaly. No acute abnormality. Electronically Signed   By: Claudie Revering M.D.   On: 06/29/2019 10:00   DG CHEST PORT 1 VIEW  Result Date: 07/02/2019 CLINICAL DATA:  Dyspnea, history acute on chronic heart failure, hypertension, paroxysmal atrial fibrillation EXAM: PORTABLE CHEST 1 VIEW COMPARISON:  Portable exam 1316 hours compared to 06/29/2019 FINDINGS: Upper normal heart size. Mediastinal contours and pulmonary vascularity normal. Lungs clear. No infiltrate, pleural effusion or pneumothorax. Bones demineralized with RIGHT glenohumeral degenerative changes noted. IMPRESSION: No acute abnormalities. Electronically Signed   By: Lavonia Dana M.D.   On: 07/02/2019 13:55        HEAR Score (for undifferentiated chest pain):     4  Assessment and Plan:   1. Chest pain with hx of chest pain with her DKA and gastroparesis, normal coronary arteries on cath 2019 and neg nuc 01/2019. Troponin 19 and 17 Dr. Percival Spanish to see, possible small vessel disease consider long acting NTG   2. Systolic HF with most recent EF 40-45% with global hypokinesis. RV normal. Seems euvolemic now 3. paf on BB and eliquis maintaining SR  4. HTN continue home meds BP 128/79  5. DKA/gastropareisis per IM  6. Morbid obesity - difficult to lose on insulin, but wt loss would be beneficial.       For questions or updates, please contact [place] Please consult [email or web address] for contact info under     Signed, Cecilie Kicks, NP  07/02/2019 2:02 PM   History and all data above reviewed.  Patient examined.  I agree with the findings  as above.  The patient has a history as described above.  She says she has had some chest pain off and on since Friday.  However, that was not the predominant complaint.  She gets around in a wheelchair because her legs give out because she has back problems.  She did develop chest discomfort today.  She required 2 sublingual nitroglycerin.  She said it was somewhat sharp.  She points to the mid and right chest.  It was 6 out of 10 in intensity.  It went away a few minutes after the nitroglycerin.  She had some nausea.  She did not describe associated shortness of breath.  There is no radiation to the neck into the arms.  There were no acute EKG findings.  There were no arrhythmias.  The patient exam reveals COR: Regular rate and rhythm, no murmurs,  Lungs: Clear to auscultation bilaterally,  Abd: Positive bowel sounds normal in frequency in pitch Ext 2+ pulses, no edema.  All available labs, radiology testing, previous records reviewed. Agree with documented assessment and plan.  CHEST PAIN: At this point I would not suggest a repeat cardiac cath.  Enzymes are  nondiagnostic.  EKG demonstrated no acute changes.  She had a work-up extensively as above.  She does have a nonischemic cardiomyopathy. I will add Imdur 60 mg daily.  If she has no further pain then I would not suggest further work up.  CM:  She seems to be euvolemic.  No change in therapy.    Jeneen Rinks Edwyn Inclan  3:15 PM  07/02/2019

## 2019-07-03 ENCOUNTER — Ambulatory Visit: Payer: Medicare Other

## 2019-07-03 ENCOUNTER — Telehealth: Payer: Medicare Other

## 2019-07-03 ENCOUNTER — Encounter (HOSPITAL_COMMUNITY): Payer: Self-pay | Admitting: Internal Medicine

## 2019-07-03 LAB — GLUCOSE, CAPILLARY
Glucose-Capillary: 109 mg/dL — ABNORMAL HIGH (ref 70–99)
Glucose-Capillary: 174 mg/dL — ABNORMAL HIGH (ref 70–99)
Glucose-Capillary: 248 mg/dL — ABNORMAL HIGH (ref 70–99)
Glucose-Capillary: 257 mg/dL — ABNORMAL HIGH (ref 70–99)

## 2019-07-03 LAB — CBC
HCT: 27.1 % — ABNORMAL LOW (ref 36.0–46.0)
Hemoglobin: 7.9 g/dL — ABNORMAL LOW (ref 12.0–15.0)
MCH: 26.2 pg (ref 26.0–34.0)
MCHC: 29.2 g/dL — ABNORMAL LOW (ref 30.0–36.0)
MCV: 90 fL (ref 80.0–100.0)
Platelets: 316 10*3/uL (ref 150–400)
RBC: 3.01 MIL/uL — ABNORMAL LOW (ref 3.87–5.11)
RDW: 14.7 % (ref 11.5–15.5)
WBC: 11.1 10*3/uL — ABNORMAL HIGH (ref 4.0–10.5)
nRBC: 0 % (ref 0.0–0.2)

## 2019-07-03 LAB — BASIC METABOLIC PANEL WITH GFR
Anion gap: 11 (ref 5–15)
BUN: 22 mg/dL — ABNORMAL HIGH (ref 6–20)
CO2: 22 mmol/L (ref 22–32)
Calcium: 8.4 mg/dL — ABNORMAL LOW (ref 8.9–10.3)
Chloride: 102 mmol/L (ref 98–111)
Creatinine, Ser: 1.41 mg/dL — ABNORMAL HIGH (ref 0.44–1.00)
GFR calc Af Amer: 48 mL/min — ABNORMAL LOW
GFR calc non Af Amer: 42 mL/min — ABNORMAL LOW
Glucose, Bld: 158 mg/dL — ABNORMAL HIGH (ref 70–99)
Potassium: 3.7 mmol/L (ref 3.5–5.1)
Sodium: 135 mmol/L (ref 135–145)

## 2019-07-03 MED ORDER — INSULIN ASPART 100 UNIT/ML ~~LOC~~ SOLN
5.0000 [IU] | Freq: Three times a day (TID) | SUBCUTANEOUS | Status: DC
  Administered 2019-07-03 (×2): 5 [IU] via SUBCUTANEOUS

## 2019-07-03 MED ORDER — HYDROMORPHONE HCL 2 MG/ML IJ SOLN: 0.5000 mg | Freq: Once | INTRAMUSCULAR | Status: AC

## 2019-07-03 MED ORDER — HYDROMORPHONE HCL 1 MG/ML IJ SOLN
0.5000 mg | Freq: Every day | INTRAMUSCULAR | Status: DC | PRN
  Administered 2019-07-03 – 2019-07-04 (×2): 0.5 mg via INTRAVENOUS
  Filled 2019-07-03 (×2): qty 0.5

## 2019-07-03 MED ORDER — HYDROMORPHONE HCL 1 MG/ML IJ SOLN
0.5000 mg | Freq: Four times a day (QID) | INTRAMUSCULAR | Status: DC | PRN
  Administered 2019-07-03: 08:00:00 0.5 mg via INTRAVENOUS
  Filled 2019-07-03: qty 0.5

## 2019-07-03 NOTE — TOC Progression Note (Signed)
Transition of Care [facility]) - Progression Note    Patient Details  Name: Deborah [name] MRN: [id number] Date of Birth: [birth year]  Transition of [facility]) CM/SW [place], [place] Phone Number: 07/03/2019, 3:00 PM  Clinical Narrative:   Spoke with patient today when I brought by IM letter.  States that her family is good support, that she has transportation to appointments and all her meds are affordable, but also stated she could use more help at home "from an aide."  Confirmed with patient that she has MCD.  Asked for permission to reach out to PCP SW/CM to let them know of her needs, which she granted.  TOC will continue to follow during the course of hospitalization.     Expected Discharge Plan: Home/Self Care Barriers to Discharge: Continued Medical Work up  Expected Discharge Plan and Services Expected Discharge Plan: Home/Self Care       Living arrangements for the past 2 months: Single Family Home Expected Discharge Date: (unknown)                                     Social Determinants of Health (SDOH) Interventions    Readmission Risk Interventions Readmission Risk Prevention Plan 07/02/2019 06/25/2019 01/29/2019  Transportation Screening Complete Complete Complete  PCP or Specialist Appt within 3-5 Days - - Complete  Not Complete comments - - -  HRI or [place] - - Complete  Social Work Consult for [place] Planning/Counseling - - Complete  Palliative Care Screening - - Not Applicable  Medication Review Press photographer) Complete Complete Complete  PCP or Specialist appointment within 3-5 days of discharge - Complete -  [place] or Home Care Consult Complete - -  SW Recovery Care/Counseling Consult Complete Complete -  Palliative Care Screening Not Applicable Not Applicable -  [place] Not Applicable Not Applicable -  Some recent data might be hidden

## 2019-07-03 NOTE — Consult Note (Signed)
Reason for Consult: Abdominal pain Referring Physician: Hospital team  Deborah [name] is an 54 y.o. female.  HPI: Patient has episodic GI complaints and we are consulted for abdominal pain across her abdomen currently she is doing fine and eating fine moving her bowel movements fine without any nausea or vomiting and supposedly she had endoscopy and colonoscopy in [place] and I can see the endoscopy in 2017 but not the report and her family history is negative for GI standpoint and she was having some chest pain but no longer in her family history is negative from a GI standpoint  Past Medical History:  Diagnosis Date  . Acute back pain with sciatica, left   . Acute back pain with sciatica, right   . AKI (acute kidney injury) ([place])   . Anemia, unspecified   . Chronic pain   . Chronic systolic CHF (congestive heart failure) ([place])   . Diabetes mellitus   . Esophageal reflux   . Fibromyalgia   . Gastric ulcer   . Gastroparesis   . Gout   . Hyperlipidemia   . Hypertension   . Lumbosacral stenosis   . NICM (nonischemic cardiomyopathy) ([place])   . Obesity   . PAF (paroxysmal atrial fibrillation) ([place])   . Stroke ([place]) 02/2011  . Vitamin B12 deficiency anemia     Past Surgical History:  Procedure Laterality Date  . CATARACT EXTRACTION  01/2014  . CHOLECYSTECTOMY      Family History  Problem Relation Age of Onset  . Diabetes Mother   . Diabetes Father   . Heart disease Father   . Diabetes Sister   . Congestive Heart Failure Sister 20  . Diabetes Brother     Social History:  reports that she has never smoked. She has never used smokeless tobacco. She reports that she does not drink alcohol or use drugs.  Allergies:  Allergies  Allergen Reactions  . Contrast Media [Iodinated Diagnostic Agents] Anaphylaxis    Cardiac arrest  . Diazepam [name]  . Isovue [Iopamidol] Anaphylaxis    Patient had seizure like activity and then code post 100 cc of isovue  300  . Lisinopril Anaphylaxis    Tongue and mouth swelling  . Penicillins Palpitations    Has patient had a PCN reaction causing immediate rash, facial/tongue/throat swelling, SOB or lightheadedness with hypotension: Yes, heart races Has patient had a PCN reaction causing severe rash involving mucus membranes or skin necrosis: No Has patient had a PCN reaction that required hospitalization: Yes  Has patient had a PCN reaction occurring within the last 10 years: No   . Acetaminophen Nausea Only and Other (See Comments)    Irritates stomach ulcer  Abdominal pain  . Tolmetin Nausea Only and Other (See Comments)    ULCER  . Aspirin Other (See Comments)    Irritates stomach ulcer   . Food Hives and Swelling    Reaction to Carrots, ketchup   . Nsaids Other (See Comments)    ULCER  . Tramadol Nausea And Vomiting    Medications: I have reviewed the patient's current medications.  Results for orders placed or performed during the hospital encounter of 06/29/19 (from the past 48 hour(s))  Glucose, capillary     Status: Abnormal   Collection Time: 07/01/19  6:00 PM  Result Value Ref Range   Glucose-Capillary 270 (H) 70 - 99 mg/dL    Comment: Glucose reference range applies only to samples taken after fasting for at least 8  hours.  Glucose, capillary     Status: Abnormal   Collection Time: 07/01/19 10:26 PM  Result Value Ref Range   Glucose-Capillary 240 (H) 70 - 99 mg/dL    Comment: Glucose reference range applies only to samples taken after fasting for at least 8 hours.  Glucose, capillary     Status: Abnormal   Collection Time: 07/02/19  8:07 AM  Result Value Ref Range   Glucose-Capillary 136 (H) 70 - 99 mg/dL    Comment: Glucose reference range applies only to samples taken after fasting for at least 8 hours.  Basic metabolic panel     Status: Abnormal   Collection Time: 07/02/19  9:37 AM  Result Value Ref Range   Sodium 136 135 - 145 mmol/L   Potassium 3.3 (L) 3.5 - 5.1 mmol/L    Chloride 103 98 - 111 mmol/L   CO2 24 22 - 32 mmol/L   Glucose, Bld 144 (H) 70 - 99 mg/dL    Comment: Glucose reference range applies only to samples taken after fasting for at least 8 hours.   BUN 25 (H) 6 - 20 mg/dL   Creatinine, Ser 1.85 (H) 0.44 - 1.00 mg/dL   Calcium 8.2 (L) 8.9 - 10.3 mg/dL   GFR calc non Af Amer 30 (L) >60 mL/min   GFR calc Af Amer 35 (L) >60 mL/min   Anion gap 9 5 - 15    Comment: Performed at [facility], [place] [address] [place], [place] [address]  CBC     Status: Abnormal   Collection Time: 07/02/19  9:37 AM  Result Value Ref Range   WBC 10.2 4.0 - 10.5 K/uL   RBC 2.94 (L) 3.87 - 5.11 MIL/uL   Hemoglobin 8.1 (L) 12.0 - 15.0 g/dL   HCT 26.7 (L) 36.0 - 46.0 %   MCV 90.8 80.0 - 100.0 fL   MCH 27.6 26.0 - 34.0 pg   MCHC 30.3 30.0 - 36.0 g/dL   RDW 14.8 11.5 - 15.5 %   Platelets 311 150 - 400 K/uL   nRBC 0.0 0.0 - 0.2 %    Comment: Performed at [facility], [place] [address], [place], [place] [address]  Troponin I (High Sensitivity)     Status: Abnormal   Collection Time: 07/02/19  9:37 AM  Result Value Ref Range   Troponin I (High Sensitivity) 19 (H) <18 ng/L    Comment: (NOTE) Elevated high sensitivity troponin I (hsTnI) values and significant  changes across serial measurements may suggest ACS but many other  chronic and acute conditions are known to elevate hsTnI results.  Refer to the "Links" section for chest pain algorithms and additional  guidance. Performed at [facility], [place] [address], [place], [place] [address]   Glucose, capillary     Status: Abnormal   Collection Time: 07/02/19 12:48 PM  Result Value Ref Range   Glucose-Capillary 209 (H) 70 - 99 mg/dL    Comment: Glucose reference range applies only to samples taken after fasting for at least 8 hours.  Brain natriuretic peptide     Status: None   Collection Time: 07/02/19  1:27 PM  Result Value Ref Range   B Natriuretic  Peptide 61.4 0.0 - 100.0 pg/mL    Comment: Performed at [facility], [place] [address], [place], [place] [address]  Troponin I (High Sensitivity)     Status: None   Collection Time: 07/02/19  1:27 PM  Result  Value Ref Range   Troponin I (High Sensitivity) 17 <18 ng/L    Comment: (NOTE) Elevated high sensitivity troponin I (hsTnI) values and significant  changes across serial measurements may suggest ACS but many other  chronic and acute conditions are known to elevate hsTnI results.  Refer to the "Links" section for chest pain algorithms and additional  guidance. Performed at [facility], [place] [address] [place], [place] [address]   Glucose, capillary     Status: Abnormal   Collection Time: 07/02/19  4:38 PM  Result Value Ref Range   Glucose-Capillary 277 (H) 70 - 99 mg/dL    Comment: Glucose reference range applies only to samples taken after fasting for at least 8 hours.  Glucose, capillary     Status: Abnormal   Collection Time: 07/02/19  9:17 PM  Result Value Ref Range   Glucose-Capillary 247 (H) 70 - 99 mg/dL    Comment: Glucose reference range applies only to samples taken after fasting for at least 8 hours.  Glucose, capillary     Status: Abnormal   Collection Time: 07/03/19  8:11 AM  Result Value Ref Range   Glucose-Capillary 109 (H) 70 - 99 mg/dL    Comment: Glucose reference range applies only to samples taken after fasting for at least 8 hours.  Basic metabolic panel     Status: Abnormal   Collection Time: 07/03/19  9:30 AM  Result Value Ref Range   Sodium 135 135 - 145 mmol/L   Potassium 3.7 3.5 - 5.1 mmol/L   Chloride 102 98 - 111 mmol/L   CO2 22 22 - 32 mmol/L   Glucose, Bld 158 (H) 70 - 99 mg/dL    Comment: Glucose reference range applies only to samples taken after fasting for at least 8 hours.   BUN 22 (H) 6 - 20 mg/dL   Creatinine, Ser 1.41 (H) 0.44 - 1.00 mg/dL   Calcium 8.4 (L) 8.9 - 10.3 mg/dL   GFR calc non Af Amer  42 (L) >60 mL/min   GFR calc Af Amer 48 (L) >60 mL/min   Anion gap 11 5 - 15    Comment: Performed at [facility], [place] [address], [place], [place] [address]  CBC     Status: Abnormal   Collection Time: 07/03/19 10:57 AM  Result Value Ref Range   WBC 11.1 (H) 4.0 - 10.5 K/uL   RBC 3.01 (L) 3.87 - 5.11 MIL/uL   Hemoglobin 7.9 (L) 12.0 - 15.0 g/dL   HCT 27.1 (L) 36.0 - 46.0 %   MCV 90.0 80.0 - 100.0 fL   MCH 26.2 26.0 - 34.0 pg   MCHC 29.2 (L) 30.0 - 36.0 g/dL   RDW 14.7 11.5 - 15.5 %   Platelets 316 150 - 400 K/uL   nRBC 0.0 0.0 - 0.2 %    Comment: Performed at [facility], [place] [address], [place], [place] [address]  Glucose, capillary     Status: Abnormal   Collection Time: 07/03/19 12:09 PM  Result Value Ref Range   Glucose-Capillary 174 (H) 70 - 99 mg/dL    Comment: Glucose reference range applies only to samples taken after fasting for at least 8 hours.    DG CHEST PORT 1 VIEW  Result Date: 07/02/2019 CLINICAL DATA:  Dyspnea, history acute on chronic heart failure, hypertension, paroxysmal atrial fibrillation EXAM: PORTABLE CHEST 1 VIEW COMPARISON:  Portable exam 1316 hours compared to 06/29/2019 FINDINGS: Upper normal heart  size. Mediastinal contours and pulmonary vascularity normal. Lungs clear. No infiltrate, pleural effusion or pneumothorax. Bones demineralized with RIGHT glenohumeral degenerative changes noted. IMPRESSION: No acute abnormalities. Electronically Signed   By: Lavonia Dana M.D.   On: 07/02/2019 13:55    Review of Systems negative except above Blood pressure 128/82, pulse 91, temperature 98.5 F (36.9 C), temperature source Oral, resp. rate 14, height 5\' 6"  (1.676 m), weight (!) 146.9 kg, last menstrual period 10/10/2012, SpO2 99 %. Physical Exam vital signs stable afebrile no acute distress sitting comfortably in the chair lungs are clear exam significant for abdomen is soft nontender cannot duplicate the pain labs and CT  reviewed  Assessment/Plan: Multiple medical problems including episodic GI issues and patient with normal CT scan Plan: Happy to see back as an outpatient consider iron studies to see if her anemia is iron deficient or of chronic disease more likely and please call us if we could be of any further assistance with this hospital stay and we will try to get her [facility] records if we see her as an outpatient  [facility] E 07/03/2019, 1:23 PM

## 2019-07-03 NOTE — Progress Notes (Addendum)
Progress Note  Patient Name: Deborah [name] Date of Encounter: 07/03/2019  Primary Cardiologist:   Fransico Him, MD   Subjective   Mild chest pain.  Breathing is better.  Some nausea but no vomiting today.  Inpatient Medications    Scheduled Meds: . amLODipine  10 mg Oral q morning - 10a  . apixaban  5 mg Oral BID  . DULoxetine  40 mg Oral BID  . erythromycin  250 mg Oral TID WC & HS  . hydrALAZINE  25 mg Oral TID  . insulin aspart  0-15 Units Subcutaneous TID WC  . insulin aspart  0-5 Units Subcutaneous QHS  . insulin aspart  5 Units Subcutaneous TID WC  . insulin glargine  45 Units Subcutaneous Q24H  . isosorbide mononitrate  60 mg Oral Daily  . melatonin  3 mg Oral QHS  . metoprolol succinate  25 mg Oral Daily  . pantoprazole (PROTONIX) IV  40 mg Intravenous Q12H   Continuous Infusions: . sodium chloride 75 mL/hr at 07/03/19 0814   PRN Meds: acetaminophen **OR** acetaminophen, albuterol, dextrose, fluticasone, HYDROmorphone (DILAUDID) injection, nitroGLYCERIN, ondansetron **OR** ondansetron (ZOFRAN) IV   Vital Signs    Vitals:   07/02/19 1446 07/02/19 2112 07/03/19 0551 07/03/19 0755  BP: 140/82 123/77 (!) 109/58 128/82  Pulse: 84 99 89 91  Resp: 18 20 20 14   Temp: (!) 97.4 F (36.3 C) 97.7 F (36.5 C) 98.5 F (36.9 C)   TempSrc: Oral Oral Oral   SpO2: 98% 99% 99% 99%  Weight:      Height:        Intake/Output Summary (Last 24 hours) at 07/03/2019 0824 Last data filed at 07/03/2019 0300 Gross per 24 hour  Intake 410.74 ml  Output --  Net 410.74 ml   Filed Weights   06/29/19 0915 07/01/19 0500  Weight: (!) 152 kg (!) 146.9 kg    Telemetry    NSR - Personally Reviewed  ECG    NA - Personally Reviewed  Physical Exam   GEN: No acute distress.   Neck: No  JVD Cardiac: RRR, no murmurs, rubs, or gallops.  Respiratory: Clear  to auscultation bilaterally. GI: Soft, nontender, non-distended  MS: No  edema; No deformity. Neuro:  Nonfocal   Psych: Normal affect   Labs    Chemistry Recent Labs  Lab 06/29/19 0928 06/29/19 1313 06/30/19 1531 07/01/19 1244 07/02/19 0937  NA 138   < > 137 136 136  K 3.0*   < > 3.2* 3.6 3.3*  CL 97*   < > 101 102 103  CO2 22   < > 22 26 24   GLUCOSE 433*   < > 266* 254* 144*  BUN 26*   < > 18 21* 25*  CREATININE 1.47*   < > 1.45* 1.85* 1.85*  CALCIUM 9.2   < > 8.3* 7.8* 8.2*  PROT 8.0  --   --   --   --   ALBUMIN 3.5  --   --   --   --   AST 19  --   --   --   --   ALT 16  --   --   --   --   ALKPHOS 122  --   --   --   --   BILITOT 0.8  --   --   --   --   GFRNONAA 40*   < > 40* 30* 30*  GFRAA 46*   < >  47* 35* 35*  ANIONGAP 19*   < > 14 8 9    < > = values in this interval not displayed.     Hematology Recent Labs  Lab 06/29/19 0928 06/30/19 0539 07/02/19 0937  WBC 13.9* 12.4* 10.2  RBC 3.97 3.14* 2.94*  HGB 10.6* 8.4* 8.1*  HCT 33.5* 27.4* 26.7*  MCV 84.4 87.3 90.8  MCH 26.7 26.8 27.6  MCHC 31.6 30.7 30.3  RDW 14.8 14.9 14.8  PLT 427* 334 311    Cardiac EnzymesNo results for input(s): TROPONINI in the last 168 hours. No results for input(s): TROPIPOC in the last 168 hours.   BNP Recent Labs  Lab 07/02/19 1327  BNP 61.4     DDimer No results for input(s): DDIMER in the last 168 hours.   Radiology    DG CHEST PORT 1 VIEW  Result Date: 07/02/2019 CLINICAL DATA:  Dyspnea, history acute on chronic heart failure, hypertension, paroxysmal atrial fibrillation EXAM: PORTABLE CHEST 1 VIEW COMPARISON:  Portable exam 1316 hours compared to 06/29/2019 FINDINGS: Upper normal heart size. Mediastinal contours and pulmonary vascularity normal. Lungs clear. No infiltrate, pleural effusion or pneumothorax. Bones demineralized with RIGHT glenohumeral degenerative changes noted. IMPRESSION: No acute abnormalities. Electronically Signed   By: Lavonia Dana M.D.   On: 07/02/2019 13:55    Cardiac Studies   NA  Patient Profile     54 y.o. female with a hx of HTN,  gastroparesis, DM, obesity hx of CVA-2012, CKD-2, fibromyalgia, chronic pain and PAF who is being seen for the evaluation of chest pain at the request of Dr. Tyrell Antonio.  Assessment & Plan    CHEST PAIN:   Imdur added yesterday.  No further work up is planned.  Enzymes non diagnostic.  Previous extensive work up and as noted in consult.    CHRONIC SYSTOLIC HF:  Torsemide has been held.  If she is not sent home on Torsemide she is going to need close follow up of her volume status as an in patient.  We will schedule follow up with [place] before discharge but she needs the close involvement of a PCP.    PROLONGED QTC:  This was markedly long but improved on EKG today.  Avoid QT prolonging meds.   [facility] will sign off.   Medication Recommendations:  Meds as on MAR.  Primary team to decide on sending home on low dose Torsemide.  Needs close TOC follow up with her primary team to follow diabetes and creat.   Other recommendations (labs, testing, etc):  Follow up creat with primary team. Follow up as an outpatient:  We will schedule follow up in our office in about one month.    For questions or updates, please contact [place] Please consult [email or web address] for contact info under Cardiology/STEMI.   Signed, Minus Breeding, MD  07/03/2019, 8:24 AM

## 2019-07-03 NOTE — Chronic Care Management (AMB) (Signed)
  Care Management   Outreach Note  07/03/2019 Name: Deborah [name] MRN: [id number] DOB: [birth year]  Referred by: [name], DO Reason for referral : Care Coordination (Care Management 2 Week F/U)   An unsuccessful telephone outreach was attempted today. The patient was referred to the case management team for assistance with care management and care coordination.   Follow Up Plan: A HIPPA compliant phone message was left for the patient providing contact information and requesting a return call.  The care management team will reach out to the patient again over the next 5-7 days.   Lazaro Arms RN, BSN, [facility] Care Management Coordinator [place] Phone: [phone] Fax: [phone]

## 2019-07-03 NOTE — Care Management Important Message (Signed)
Important Message  Patient Details IM Letter given to Roque Lias SW Case Manager to present to the Patient Name: Deborah [name] MRN: [id number] Date of Birth: 07-Jun-1965   Medicare Important Message Given:  Yes     Kerin Salen 07/03/2019, 10:08 AM

## 2019-07-03 NOTE — Progress Notes (Signed)
PROGRESS NOTE    Deborah [name]  BHA:[id number] DOB: 08/14/1965 DOA: 06/29/2019 PCP: [name], DO   Brief Narrative: 54 year old with past medical history significant for diabetes type 2, gastroparesis, recent and recurrent admissions for DKA, last [id number], chronic systolic heart failure, paroxysmal A. fib on Eliquis, CKD stage IIIb who presented with sudden onset nausea, vomiting and abdominal pain.  Patient was found to be in DKA. Develops chest pressure. Cardiology consulted recommend Imdur. Patient continue to have abdominal pain and vomiting. GI consulted, see recommendations from GI.    Assessment & Plan:   Principal Problem:   DKA (diabetic ketoacidoses) ([place]) Active Problems:   Essential hypertension, benign   Uncontrolled type 2 diabetes mellitus with diabetic neuropathy, with long-term current use of insulin ([facility])   Hypokalemia   Diabetic gastroparesis ([facility])   Morbid obesity ([place])   Chronic systolic CHF (congestive heart failure) ([place])   1-DKA, uncontrolled diabetes: Type II Patient was admitted started on IV fluids, insulin drip. Patient presented with hyperglycemia anion gap at 19. Gap closed.  Back on long-acting, continue with sliding scale insulin. Added meals coverage. Increase lantus to 45 units.  Increase meals coverage to 5 units.   2-Diabetic gastroparesis, abdominal pain: She continues to have abdominal pain and nausea and vomiting. She is intolerant to Reglan, she develop adverse effect from it.  Continue with supportive care. Continue to taper off dilaudid. Change dilaudid to daily.  Started  erythromycin.  Still complaining of abdominal pain, and vomiting. GI consulted.   Chest pressure, pain;  Check EKG has new T wave inversion V 2.  Troponin not significantly elevated.   Nitroglycerin.  Cardiology consult. Recommend Imdur.  Chest x ray negative for pulmonary edema.   3-AKI on CKD stage II;  Hold IV fluids. Cr down to 1.  4 Strict I and o  4-Hypokalemia: resolved.   5-Chronic systolic heart failure: To metoprolol and hydralazine.  Continue to hold torsemide.  6-paroxysmal A. fib: Continue with Eliquis and Toprol 7-GERD;  Continue with PPI 8-hypomagnesemia: Replete IV Anemia; follow trend. Lower today suspect component hemodilution.   leukocytosis: Reactive   Estimated body mass index is 52.28 kg/m as calculated from the following:   Height as of this encounter: 5\' 6"  (1.676 m).   Weight as of this encounter: 146.9 kg.   DVT prophylaxis: Eliquis Code Status: Full code Family Communication: Care discussed with patient Disposition Plan:  Patient is from: Home Anticipated d/c date: Home in 1 or 2 days after improvement of diabetes gastroparesis.  Barriers to d/c or necessity for inpatient status: Requiring IV fluids for AKI, and work up for chest pain   Consultants:   None  Procedures:   None  Antimicrobials:   None Subjective: Report vomiting today, complaints of abdominal pain. I explain to her that we need to wean her off dilaudid.    Objective: Vitals:   07/02/19 2112 07/03/19 0551 07/03/19 0755 07/03/19 1500  BP: 123/77 (!) 109/58 128/82 113/73  Pulse: 99 89 91 90  Resp: 20 20 14 17   Temp: 97.7 F (36.5 C) 98.5 F (36.9 C)  (!) 97.3 F (36.3 C)  TempSrc: Oral Oral  Oral  SpO2: 99% 99% 99% 98%  Weight:      Height:        Intake/Output Summary (Last 24 hours) at 07/03/2019 1640 Last data filed at 07/03/2019 0300 Gross per 24 hour  Intake 410.74 ml  Output --  Net 410.74 ml   [employer]  06/29/19 0915 07/01/19 0500  Weight: (!) 152 kg (!) 146.9 kg    Examination:  General exam: NAD Respiratory system: CTA Cardiovascular system: S 1, S 2 RRR Gastrointestinal system: BS present, soft, nt Central nervous system: Alert Extremities: Symmetric power.      Data Reviewed: I have personally reviewed following labs and imaging studies  CBC: Recent Labs   Lab 06/29/19 0928 06/30/19 0539 07/02/19 0937 07/03/19 1057  WBC 13.9* 12.4* 10.2 11.1*  NEUTROABS 11.2*  --   --   --   HGB 10.6* 8.4* 8.1* 7.9*  HCT 33.5* 27.4* 26.7* 27.1*  MCV 84.4 87.3 90.8 90.0  PLT 427* 334 311 297   Basic Metabolic Panel: Recent Labs  Lab 06/29/19 0928 06/29/19 1313 06/30/19 0410 06/30/19 0539 06/30/19 1531 07/01/19 1244 07/02/19 0937 07/03/19 0930  NA 138   < > 138  --  137 136 136 135  K 3.0*   < > 2.8*  --  3.2* 3.6 3.3* 3.7  CL 97*   < > 103  --  101 102 103 102  CO2 22   < > 26  --  22 26 24 22   GLUCOSE 433*   < > 173*  --  266* 254* 144* 158*  BUN 26*   < > 20  --  18 21* 25* 22*  CREATININE 1.47*   < > 1.30*  --  1.45* 1.85* 1.85* 1.41*  CALCIUM 9.2   < > 8.0*  --  8.3* 7.8* 8.2* 8.4*  MG 0.9*  --   --  1.3*  --  1.7  --   --    < > = values in this interval not displayed.   GFR: Estimated Creatinine Clearance: 67.1 mL/min (A) (by C-G formula based on SCr of 1.41 mg/dL (H)). Liver Function Tests: Recent Labs  Lab 06/29/19 0928  AST 19  ALT 16  ALKPHOS 122  BILITOT 0.8  PROT 8.0  ALBUMIN 3.5   Recent Labs  Lab 06/29/19 0928  LIPASE 39   No results for input(s): AMMONIA in the last 168 hours. Coagulation Profile: No results for input(s): INR, PROTIME in the last 168 hours. Cardiac Enzymes: No results for input(s): CKTOTAL, CKMB, CKMBINDEX, TROPONINI in the last 168 hours. BNP (last 3 results) No results for input(s): PROBNP in the last 8760 hours. HbA1C: No results for input(s): HGBA1C in the last 72 hours. CBG: Recent Labs  Lab 07/02/19 1248 07/02/19 1638 07/02/19 2117 07/03/19 0811 07/03/19 1209  GLUCAP 209* 277* 247* 109* 174*   Lipid Profile: No results for input(s): CHOL, HDL, LDLCALC, TRIG, CHOLHDL, LDLDIRECT in the last 72 hours. Thyroid Function Tests: No results for input(s): TSH, T4TOTAL, FREET4, T3FREE, THYROIDAB in the last 72 hours. Anemia Panel: No results for input(s): VITAMINB12, FOLATE,  FERRITIN, TIBC, IRON, RETICCTPCT in the last 72 hours. Sepsis Labs: Recent Labs  Lab 06/29/19 1048 06/29/19 1313  LATICACIDVEN 4.5* 4.2*    Recent Results (from the past 240 hour(s))  Respiratory Panel by RT PCR (Flu A&B, Covid) - Nasopharyngeal Swab     Status: None   Collection Time: 06/29/19 11:28 AM   Specimen: Nasopharyngeal Swab  Result Value Ref Range Status   SARS Coronavirus 2 by RT PCR NEGATIVE NEGATIVE Final    Comment: (NOTE) SARS-CoV-2 target nucleic acids are NOT DETECTED. The SARS-CoV-2 RNA is generally detectable in upper respiratoy specimens during the acute phase of infection. The lowest concentration of SARS-CoV-2 viral copies this assay can detect  is 131 copies/mL. A negative result does not preclude SARS-Cov-2 infection and should not be used as the sole basis for treatment or other patient management decisions. A negative result may occur with  improper specimen collection/handling, submission of specimen other than nasopharyngeal swab, presence of viral mutation(s) within the areas targeted by this assay, and inadequate number of viral copies (<131 copies/mL). A negative result must be combined with clinical observations, patient history, and epidemiological information. The expected result is Negative. Fact Sheet for Patients:  PinkCheek.be Fact Sheet for Healthcare Providers:  GravelBags.it This test is not yet ap proved or cleared by the [place] FDA and  has been authorized for detection and/or diagnosis of SARS-CoV-2 by FDA under an Emergency Use Authorization (EUA). This EUA will remain  in effect (meaning this test can be used) for the duration of the COVID-19 declaration under Section 564(b)(1) of the Act, 21 U.S.C. section 360bbb-3(b)(1), unless the authorization is terminated or revoked sooner.    Influenza A by PCR NEGATIVE NEGATIVE Final   Influenza B by PCR NEGATIVE NEGATIVE  Final    Comment: (NOTE) The Xpert Xpress SARS-CoV-2/FLU/RSV assay is intended as an aid in  the diagnosis of influenza from Nasopharyngeal swab specimens and  should not be used as a sole basis for treatment. Nasal washings and  aspirates are unacceptable for Xpert Xpress SARS-CoV-2/FLU/RSV  testing. Fact Sheet for Patients: PinkCheek.be Fact Sheet for Healthcare Providers: GravelBags.it This test is not yet approved or cleared by the [place] FDA and  has been authorized for detection and/or diagnosis of SARS-CoV-2 by  FDA under an Emergency Use Authorization (EUA). This EUA will remain  in effect (meaning this test can be used) for the duration of the  Covid-19 declaration under Section 564(b)(1) of the Act, 21  U.S.C. section 360bbb-3(b)(1), unless the authorization is  terminated or revoked. Performed at [facility], [place] [address], [place], [place] [phone]          Radiology Studies: DG CHEST PORT 1 VIEW  Result Date: 07/02/2019 CLINICAL DATA:  Dyspnea, history acute on chronic heart failure, hypertension, paroxysmal atrial fibrillation EXAM: PORTABLE CHEST 1 VIEW COMPARISON:  Portable exam 1316 hours compared to 06/29/2019 FINDINGS: Upper normal heart size. Mediastinal contours and pulmonary vascularity normal. Lungs clear. No infiltrate, pleural effusion or pneumothorax. Bones demineralized with RIGHT glenohumeral degenerative changes noted. IMPRESSION: No acute abnormalities. Electronically Signed   By: Lavonia Dana M.D.   On: 07/02/2019 13:55        Scheduled Meds: . amLODipine  10 mg Oral q morning - 10a  . apixaban  5 mg Oral BID  . DULoxetine  40 mg Oral BID  . erythromycin  250 mg Oral TID WC & HS  . hydrALAZINE  25 mg Oral TID  . HYDROmorphone  0.5 mg Intravenous Once  . insulin aspart  0-15 Units Subcutaneous TID WC  . insulin aspart  0-5 Units Subcutaneous QHS  . insulin  aspart  5 Units Subcutaneous TID WC  . insulin glargine  45 Units Subcutaneous Q24H  . isosorbide mononitrate  60 mg Oral Daily  . melatonin  3 mg Oral QHS  . metoprolol succinate  25 mg Oral Daily  . pantoprazole (PROTONIX) IV  40 mg Intravenous Q12H   Continuous Infusions:    LOS: 3 days    Time spent: 35 minutes.     Elmarie Shiley, MD [facility]   If 7PM-7AM, please contact night-coverage [email or web address]  07/03/2019,  4:40 PM

## 2019-07-04 ENCOUNTER — Ambulatory Visit: Payer: Medicare Other | Admitting: Family Medicine

## 2019-07-04 DIAGNOSIS — F329 Major depressive disorder, single episode, unspecified: Secondary | ICD-10-CM

## 2019-07-04 DIAGNOSIS — Z794 Long term (current) use of insulin: Secondary | ICD-10-CM

## 2019-07-04 DIAGNOSIS — E1165 Type 2 diabetes mellitus with hyperglycemia: Secondary | ICD-10-CM

## 2019-07-04 DIAGNOSIS — E1140 Type 2 diabetes mellitus with diabetic neuropathy, unspecified: Secondary | ICD-10-CM

## 2019-07-04 DIAGNOSIS — N182 Chronic kidney disease, stage 2 (mild): Secondary | ICD-10-CM

## 2019-07-04 LAB — CBC
HCT: 25.4 % — ABNORMAL LOW (ref 36.0–46.0)
Hemoglobin: 7.7 g/dL — ABNORMAL LOW (ref 12.0–15.0)
MCH: 26.9 pg (ref 26.0–34.0)
MCHC: 30.3 g/dL (ref 30.0–36.0)
MCV: 88.8 fL (ref 80.0–100.0)
Platelets: 330 10*3/uL (ref 150–400)
RBC: 2.86 MIL/uL — ABNORMAL LOW (ref 3.87–5.11)
RDW: 14.8 % (ref 11.5–15.5)
WBC: 11.6 10*3/uL — ABNORMAL HIGH (ref 4.0–10.5)
nRBC: 0 % (ref 0.0–0.2)

## 2019-07-04 LAB — BASIC METABOLIC PANEL
Anion gap: 7 (ref 5–15)
BUN: 18 mg/dL (ref 6–20)
CO2: 26 mmol/L (ref 22–32)
Calcium: 8.7 mg/dL — ABNORMAL LOW (ref 8.9–10.3)
Chloride: 103 mmol/L (ref 98–111)
Creatinine, Ser: 1.11 mg/dL — ABNORMAL HIGH (ref 0.44–1.00)
GFR calc Af Amer: >60 mL/min (ref >60)
GFR calc non Af Amer: 56 mL/min — ABNORMAL LOW (ref >60)
Glucose, Bld: 152 mg/dL — ABNORMAL HIGH (ref 70–99)
Potassium: 3.4 mmol/L — ABNORMAL LOW (ref 3.5–5.1)
Sodium: 136 mmol/L (ref 135–145)

## 2019-07-04 LAB — IRON AND TIBC
Iron: 18 ug/dL — ABNORMAL LOW (ref 28–170)
Saturation Ratios: 5 % — ABNORMAL LOW (ref 10.4–31.8)
TIBC: 345 ug/dL (ref 250–450)
UIBC: 327 ug/dL

## 2019-07-04 LAB — RETICULOCYTES
Immature Retic Fract: 27.1 % — ABNORMAL HIGH (ref 2.3–15.9)
RBC.: 2.84 MIL/uL — ABNORMAL LOW (ref 3.87–5.11)
Retic Count, Absolute: 75.3 10*3/uL (ref 19.0–186.0)
Retic Ct Pct: 2.7 % (ref 0.4–3.1)

## 2019-07-04 LAB — FOLATE: Folate: 6.6 ng/mL (ref >5.9)

## 2019-07-04 LAB — FERRITIN: Ferritin: 48 ng/mL (ref 11–307)

## 2019-07-04 LAB — MAGNESIUM: Magnesium: 1.6 mg/dL — ABNORMAL LOW (ref 1.7–2.4)

## 2019-07-04 LAB — GLUCOSE, CAPILLARY
Glucose-Capillary: 153 mg/dL — ABNORMAL HIGH (ref 70–99)
Glucose-Capillary: 114 mg/dL — ABNORMAL HIGH (ref 70–99)

## 2019-07-04 LAB — VITAMIN B12: Vitamin B-12: 251 pg/mL (ref 180–914)

## 2019-07-04 MED ORDER — SODIUM CHLORIDE 0.9% FLUSH
3.0000 mL | Freq: Two times a day (BID) | INTRAVENOUS | Status: DC
  Administered 2019-07-04: 10:00:00 3 mL via INTRAVENOUS

## 2019-07-04 MED ORDER — ERYTHROMYCIN BASE 250 MG PO TABS: 250.0000 mg | ORAL_TABLET | Freq: Three times a day (TID) | ORAL | 0 refills | Status: DC

## 2019-07-04 MED ORDER — SODIUM CHLORIDE 0.9 % IV SOLN
INTRAVENOUS | Status: DC | PRN
  Administered 2019-07-04: 13:00:00 1000 mL via INTRAVENOUS

## 2019-07-04 MED ORDER — MAGNESIUM SULFATE 4 GM/100ML IV SOLN
4.0000 g | Freq: Once | INTRAVENOUS | Status: AC
  Administered 2019-07-04: 13:00:00 4 g via INTRAVENOUS
  Filled 2019-07-04: qty 100

## 2019-07-04 MED ORDER — POTASSIUM CHLORIDE CRYS ER 20 MEQ PO TBCR
40.0000 meq | EXTENDED_RELEASE_TABLET | Freq: Once | ORAL | Status: AC
  Administered 2019-07-04: 13:00:00 40 meq via ORAL
  Filled 2019-07-04: qty 2

## 2019-07-04 MED ORDER — TORSEMIDE 20 MG PO TABS: 40.0000 mg | ORAL_TABLET | Freq: Every day | ORAL | Status: DC

## 2019-07-04 MED ORDER — ISOSORBIDE MONONITRATE ER 60 MG PO TB24: 60.0000 mg | ORAL_TABLET | Freq: Every day | ORAL | 1 refills | Status: DC

## 2019-07-04 MED ORDER — PANTOPRAZOLE SODIUM 40 MG PO TBEC: 40.0000 mg | DELAYED_RELEASE_TABLET | Freq: Two times a day (BID) | ORAL | 0 refills | Status: DC

## 2019-07-04 MED ORDER — MAGNESIUM OXIDE 400 (241.3 MG) MG PO TABS: 400.0000 mg | ORAL_TABLET | Freq: Two times a day (BID) | ORAL | Status: DC

## 2019-07-04 NOTE — Discharge Summary (Addendum)
Physician Discharge Summary  Deborah [name] FMB:[id number] DOB: Aug 12, 1965 DOA: 06/29/2019  PCP: [name], DO  Admit date: 06/29/2019 Discharge date: 07/04/2019  Time spent: 55 minutes  Recommendations for Outpatient Follow-up:  1. Follow-up with [name], DO as scheduled on 07/10/2019.  On follow-up patient's diabetes/diabetic gastroparesis will need to be followed up upon.  Patient will need a CBC done to follow-up on H&H.  Patient will need a basic metabolic profile and a magnesium level done to follow-up on electrolytes and renal function. 2. Follow-up with Dr. Fransico Him cardiology 07/31/2019.   Discharge Diagnoses:  Principal Problem:   DKA (diabetic ketoacidoses) ([place]) Active Problems:   Essential hypertension, benign   Uncontrolled type 2 diabetes mellitus with diabetic neuropathy, with long-term current use of insulin (H[facility])   CKD (chronic kidney disease), stage II   Hypokalemia   Hypomagnesemia   Diabetic gastroparesis ([facility])   Morbid obesity ([facility])   Depression   Chronic systolic CHF (congestive heart failure) ([place])   Discharge Condition: Stable and improved  Diet recommendation: Carb modified diet  Filed Weights   06/29/19 0915 07/01/19 0500 07/04/19 0529  Weight: (!) 152 kg (!) 146.9 kg (!) 156.1 kg    History of present illness:  HPI per Dr. Quillian Quince Deborah [name] is a 54 y.o. female with medical history significant of diabetes mellitus type II, gastroparesis, recent recurrent admissions for DKA with last admission on 06/22/2019 and discharged on 09/06/7791, chronic systolic heart failure, paroxysmal A. fib on Eliquis, chronic kidney disease stage IIIb presented with sudden onset of nausea, vomiting and abdominal pain that started an hour prior to presentation to the ED.  Patient stated that she had vomited 6 times already, nonbilious nonbloody associated with upper abdominal pain, sharp in nature, 6-8 out of 10 in intensity, with no radiation.   No fever, worsening shortness of breath.  Also complains of some lower chest pain with abdominal pain.  She stated that her legs are more swollen than normal.  She is on torsemide at home and took her last dose last night.  She also took her long-acting insulin last night.  She stated that she is compliant with her insulin.  No loss of consciousness, seizures, blurring of vision.  ED Course: She was found to have hyperglycemia with anion gap of 19.  She was treated with IV fluids and insulin drip.  Hospital service was called to evaluate the patient.  Hospital Course:  1 diabetic ketoacidosis/uncontrolled type 2 diabetes mellitus (hemoglobin A1c 8.331 2021) Patient admitted in DKA presented with nausea vomiting abdominal pain placed on IV fluids as well as the glucose stabilizer.  Patient noted on admission to have an anion gap of 19.  Patient's diuretics were held patient hydrated with IV fluids.  Anion gap closed and patient transitioned to long-acting Lantus and meal coverage insulin and long-acting Lantus dose adjusted back to home regimen of 45 units daily.  Blood glucose levels improved.  Patient did have some complaints of chest pressure cardiology was subsequently consulted who saw the patient.  Patient noted to have an echo done 01/26/2019 with a EF of 40 to 45%, no LVH, global hypokinesis.  Patient underwent a Lexiscan Myoview on 01/27/2019 with no ST segment deviation noted and no T wave inversion noted during stress.  It was felt that was an intermediate risk study.  EKG done with no acute EKG findings.  No arrhythmias were noted.  Cardiology recommended Imdur and no further cardiac work-up with outpatient follow-up.  Patient improved clinically.  Blood glucose levels were controlled.  Patient is to follow-up with PCP as scheduled.  Outpatient follow-up.  2.  Diabetic gastroparesis/abdominal pain Patient had presented with abdominal pain, nausea vomiting.  Patient noted to have a history of  diabetic gastroparesis intolerant to Reglan and developed an adverse effect from it and subsequently discontinued by her PCP per patient.  Patient was subsequently started on erythromycin with improvement with abdominal pain nausea and vomiting.  Patient was tolerating diet by day of discharge.  GI was consulted and as patient had improved clinically recommended no further inpatient work-up with outpatient follow-up for follow-up on her anemia.  Patient be discharged in stable and improved condition.  3.  Chest pressure Patient on presentation had some complaints of chest pressure.  Cardiology was consulted who followed the patient during the hospitalization.Patient noted to have an echo done 01/26/2019 with a EF of 40 to 45%, no LVH, global hypokinesis.  Patient underwent a Lexiscan Myoview on 01/27/2019 with no ST segment deviation noted and no T wave inversion noted during stress.  It was felt that was an intermediate risk study.  EKG done with no acute EKG findings.  No arrhythmias were noted.  Cardiology recommended Imdur and no further cardiac work-up with outpatient follow-up.  4.  Acute kidney injury on chronic kidney disease stage II Patient diuretics held.  Patient hydrated with IV fluids secondary to problem #1.  Renal function improved and was back to baseline by day of discharge.  Patient's diuretics will be resumed 2 to 3 days post discharge.  5.  Hypokalemia./Hypomagnesemia Repleted.  6.  Chronic systolic heart failure Remained stable.  Patient maintained on home regimen of metoprolol and hydralazine.  Patient's torsemide was held during the hospitalization will be resumed 2 to 3 days post discharge.  Patient was seen by cardiology no further work-up needed at this time.  Outpatient follow-up.  7.  Paroxysmal atrial fibrillation Patient maintained on Toprol for rate control.  Eliquis for anticoagulation.  8.  Gastroesophageal reflux disease Patient maintained on a PPI.  9.  Anemia  of chronic disease Felt likely dilutional component.  Patient with no overt bleeding.  Hemoglobin stabilized at 7.7 by day of discharge.  Anemia panel consistent with anemia of chronic disease.  Vitamin B12 levels of 251.  Folate levels of 6.6.  TIBC at 345 and iron level of 18.  Outpatient follow-up.  10.  Depression Remained stable.  Patient maintained on home regimen of antidepressant medications.  Procedures:  Chest x-ray 07/02/2019, 06/29/2019    Consultations:  Cardiology: Dr. Percival Spanish 07/02/2019  Gastroenterology: Dr. Watt Climes 07/03/2019  Discharge Exam: Vitals:   07/04/19 0529 07/04/19 1300  BP: 134/74 126/79  Pulse: (!) 103 95  Resp: 17 18  Temp: (!) 97.3 F (36.3 C) 97.9 F (36.6 C)  SpO2: 93% 97%    General: NAD Cardiovascular: RRR Respiratory: CTAB  Discharge Instructions   Discharge Instructions    Diet Carb Modified   Complete by: As directed    Increase activity slowly   Complete by: As directed      Allergies as of 07/04/2019      Reactions   Contrast Media [iodinated Diagnostic Agents] Anaphylaxis   Cardiac arrest   Diazepam [name]   Isovue [iopamidol] Anaphylaxis   Patient had seizure like activity and then code post 100 cc of isovue 300   Lisinopril Anaphylaxis   Tongue and mouth swelling   Penicillins Palpitations   Has patient  had a PCN reaction causing immediate rash, facial/tongue/throat swelling, SOB or lightheadedness with hypotension: Yes, heart races Has patient had a PCN reaction causing severe rash involving mucus membranes or skin necrosis: No Has patient had a PCN reaction that required hospitalization: Yes  Has patient had a PCN reaction occurring within the last 10 years: No   Acetaminophen Nausea Only, Other (See Comments)   Irritates stomach ulcer  Abdominal pain   Tolmetin Nausea Only, Other (See Comments)   ULCER   Aspirin Other (See Comments)   Irritates stomach ulcer    Food Hives, Swelling   Reaction to  Carrots, ketchup    Nsaids Other (See Comments)   ULCER   Tramadol Nausea And Vomiting      Medication List    STOP taking these medications   amLODipine 10 MG tablet Commonly known as: NORVASC   metoCLOPramide 10 MG tablet Commonly known as: REGLAN     TAKE these medications   albuterol (2.5 MG/3ML) 0.083% nebulizer solution Commonly known as: PROVENTIL Take 3 mLs (2.5 mg total) by nebulization every 6 (six) hours as needed for wheezing or shortness of breath.   apixaban 5 MG Tabs tablet Commonly known as: ELIQUIS Take 1 tablet (5 mg total) by mouth 2 (two) times daily.   atorvastatin 10 MG tablet Commonly known as: LIPITOR Take 1 tablet (10 mg total) by mouth daily.   blood glucose meter kit and supplies Kit Dispense based on patient and insurance preference. Use up to four times daily as directed. (FOR ICD-9 250.00, 250.01).   cetirizine 10 MG tablet Commonly known as: ZYRTEC Take 1 tablet (10 mg total) by mouth daily.   dicyclomine 20 MG tablet Commonly known as: BENTYL TAKE 1 TABLET (20 MG TOTAL) BY MOUTH 3 (THREE) TIMES DAILY AS NEEDED FOR SPASMS (ABDOMINAL CRAMPING).   DULoxetine HCl 40 MG Cpep TAKE ONE CAPSULE BY MOUTH TWICE DAILY What changed:   how much to take  when to take this   erythromycin 250 MG tablet Commonly known as: E-MYCIN Take 1 tablet (250 mg total) by mouth 4 (four) times daily -  with meals and at bedtime.   fluticasone 50 MCG/ACT nasal spray Commonly known as: FLONASE Place 2 sprays into both nostrils daily as needed for allergies or rhinitis.   hydrALAZINE 25 MG tablet Commonly known as: APRESOLINE TAKE ONE TABLET BY MOUTH THREE TIMES A DAY. FOR BLOOD PRESSURE What changed: See the new instructions.   isosorbide mononitrate 60 MG 24 hr tablet Commonly known as: IMDUR Take 1 tablet (60 mg total) by mouth daily. Start taking on: July 05, 2019   Lantus SoloStar 100 UNIT/ML Solostar Pen Generic drug: insulin glargine Inject  45 Units into the skin at bedtime.   loperamide 2 MG capsule Commonly known as: IMODIUM Take 2 mg by mouth 4 (four) times daily as needed for diarrhea or loose stools.   magnesium oxide 400 (241.3 Mg) MG tablet Commonly known as: MAG-OX Take 1 tablet (400 mg total) by mouth 2 (two) times daily.   melatonin 3 MG Tabs tablet Take 2 tablets (6 mg total) by mouth at bedtime.   metoprolol succinate 25 MG 24 hr tablet Commonly known as: TOPROL-XL Take 25 mg by mouth daily.   nitroGLYCERIN 0.4 MG SL tablet Commonly known as: NITROSTAT Place 1 tablet (0.4 mg total) under the tongue every 5 (five) minutes as needed for chest pain.   NovoLOG FlexPen 100 UNIT/ML FlexPen Generic drug: insulin aspart Inject 15  Units into the skin 3 (three) times daily with meals. What changed: how much to take   ondansetron 4 MG disintegrating tablet Commonly known as: ZOFRAN-ODT Take 4 mg by mouth every 8 (eight) hours as needed for nausea or vomiting.   pantoprazole 40 MG tablet Commonly known as: PROTONIX Take 1 tablet (40 mg total) by mouth 2 (two) times daily.   torsemide 20 MG tablet Commonly known as: DEMADEX Take 2 tablets (40 mg total) by mouth daily. Hold if having significant vomiting Start taking on: July 06, 2019 What changed: These instructions start on July 06, 2019. If you are unsure what to do until then, ask your doctor or other care provider.      Allergies  Allergen Reactions  . Contrast Media [Iodinated Diagnostic Agents] Anaphylaxis    Cardiac arrest  . Diazepam [name]  . Isovue [Iopamidol] Anaphylaxis    Patient had seizure like activity and then code post 100 cc of isovue 300  . Lisinopril Anaphylaxis    Tongue and mouth swelling  . Penicillins Palpitations    Has patient had a PCN reaction causing immediate rash, facial/tongue/throat swelling, SOB or lightheadedness with hypotension: Yes, heart races Has patient had a PCN reaction causing severe rash  involving mucus membranes or skin necrosis: No Has patient had a PCN reaction that required hospitalization: Yes  Has patient had a PCN reaction occurring within the last 10 years: No   . Acetaminophen Nausea Only and Other (See Comments)    Irritates stomach ulcer  Abdominal pain  . Tolmetin Nausea Only and Other (See Comments)    ULCER  . Aspirin Other (See Comments)    Irritates stomach ulcer   . Food Hives and Swelling    Reaction to Carrots, ketchup   . Nsaids Other (See Comments)    ULCER  . Tramadol Nausea And Vomiting   Follow-up Information    [name], DO Follow up on 07/10/2019.   Specialty: Family Medicine Why: Tuesday at 1:30 for your hospital follow up appointment.  Make sure to ask the social worker there about a referral for persona care services in the home.  I reached out to here but did not hear back Contact information: [address] [address] [phone]        Sueanne Margarita, MD Follow up on 07/31/2019.   Specialty: Cardiology Why: at 9:20 AM Contact information: [address] [place] [address] [phone]            The results of significant diagnostics from this hospitalization (including imaging, microbiology, ancillary and laboratory) are listed below for reference.    Significant Diagnostic Studies: CT ABDOMEN PELVIS WO CONTRAST  Result Date: 06/10/2019 CLINICAL DATA:  Nausea and vomiting. EXAM: CT ABDOMEN AND PELVIS WITHOUT CONTRAST TECHNIQUE: Multidetector CT imaging of the abdomen and pelvis was performed following the standard protocol without IV contrast. COMPARISON:  May 27, 2019 FINDINGS: Lower chest: The lung bases are clear. The heart size is normal. Hepatobiliary: There is decreased hepatic attenuation suggestive of hepatic steatosis. Status post cholecystectomy.There is no biliary ductal dilation. Pancreas: Normal contours without ductal dilatation. No peripancreatic fluid collection.  Spleen: Unremarkable. Adrenals/Urinary Tract: --Adrenal glands: Unremarkable. --Right kidney/ureter: No hydronephrosis or radiopaque kidney stones. --Left kidney/ureter: No hydronephrosis or radiopaque kidney stones. --Urinary bladder: Unremarkable. Stomach/Bowel: --Stomach/Duodenum: No hiatal hernia or other gastric abnormality. Normal duodenal course and caliber. --Small bowel: Unremarkable. --Colon: Unremarkable. --Appendix: Normal. Vascular/Lymphatic: Atherosclerotic calcification is  present within the non-aneurysmal abdominal aorta, without hemodynamically significant stenosis. --No retroperitoneal lymphadenopathy. --No mesenteric lymphadenopathy. --No pelvic or inguinal lymphadenopathy. Reproductive: A dominant uterine fibroid is again noted measuring approximately 5.1 cm. Other: No ascites or free air. The abdominal wall is normal. Musculoskeletal. No acute displaced fractures. IMPRESSION: 1. No CT findings to explain the patient's nausea and vomiting. 2. Hepatic steatosis. 3. Aortic Atherosclerosis (ICD10-I70.0). Electronically Signed   By: Constance Holster M.D.   On: 06/10/2019 02:50   DG Chest 2 View  Result Date: 06/29/2019 CLINICAL DATA:  Chest pain and shortness of breath. Diaphoresis. EXAM: CHEST - 2 VIEW COMPARISON:  06/22/2019 FINDINGS: Borderline enlarged cardiac silhouette. Clear lungs with normal vascularity. Thoracic spine degenerative changes. IMPRESSION: Borderline cardiomegaly. No acute abnormality. Electronically Signed   By: Claudie Revering M.D.   On: 06/29/2019 10:00   DG Chest 2 View  Result Date: 06/22/2019 CLINICAL DATA:  Vomiting, central chest and abdominal pain, history of diabetic ketoacidosis EXAM: CHEST - 2 VIEW COMPARISON:  06/10/2019 FINDINGS: The heart size and mediastinal contours are within normal limits. Both lungs are clear. The visualized skeletal structures are unremarkable. IMPRESSION: No active cardiopulmonary disease. Electronically Signed   By: Randa Ngo  M.D.   On: 06/22/2019 19:24   US RENAL  Result Date: 06/10/2019 CLINICAL DATA:  AKI EXAM: RENAL / URINARY TRACT ULTRASOUND COMPLETE COMPARISON:  None. FINDINGS: Right Kidney: Renal measurements: 12 x 4.8 x 5.3 cm = volume: 160.6 mL . Echogenicity within normal limits. No mass or hydronephrosis visualized. Left Kidney: Renal measurements: 12 x 5.9 x 4.9 cm = volume: 22 mL. Echogenicity within normal limits. No mass or hydronephrosis visualized. Bladder: Appears normal for degree of bladder distention. Other: Increased liver echogenicity likely reflecting steatosis. IMPRESSION: No hydronephrosis. Electronically Signed   By: Macy Mis M.D.   On: 06/10/2019 16:28   DG CHEST PORT 1 VIEW  Result Date: 07/02/2019 CLINICAL DATA:  Dyspnea, history acute on chronic heart failure, hypertension, paroxysmal atrial fibrillation EXAM: PORTABLE CHEST 1 VIEW COMPARISON:  Portable exam 1316 hours compared to 06/29/2019 FINDINGS: Upper normal heart size. Mediastinal contours and pulmonary vascularity normal. Lungs clear. No infiltrate, pleural effusion or pneumothorax. Bones demineralized with RIGHT glenohumeral degenerative changes noted. IMPRESSION: No acute abnormalities. Electronically Signed   By: Lavonia Dana M.D.   On: 07/02/2019 13:55   DG Chest Port 1 View  Result Date: 06/10/2019 CLINICAL DATA:  [name] EXAM: PORTABLE CHEST 1 VIEW COMPARISON:  06/05/2019 FINDINGS: Cardiac shadow is enlarged but stable. Lungs are well aerated bilaterally. No focal infiltrate or sizable effusion is seen. Previously seen vascular congestion has improved. No bony abnormality is noted. IMPRESSION: No acute abnormality noted. Electronically Signed   By: Inez Catalina M.D.   On: 06/10/2019 00:51    Microbiology: Recent Results (from the past 240 hour(s))  Respiratory Panel by RT PCR (Flu A&B, Covid) - Nasopharyngeal Swab     Status: None   Collection Time: 06/29/19 11:28 AM   Specimen: Nasopharyngeal Swab  Result  Value Ref Range Status   SARS Coronavirus 2 by RT PCR NEGATIVE NEGATIVE Final    Comment: (NOTE) SARS-CoV-2 target nucleic acids are NOT DETECTED. The SARS-CoV-2 RNA is generally detectable in upper respiratoy specimens during the acute phase of infection. The lowest concentration of SARS-CoV-2 viral copies this assay can detect is 131 copies/mL. A negative result does not preclude SARS-Cov-2 infection and should not be used as the sole basis for treatment or other patient  management decisions. A negative result may occur with  improper specimen collection/handling, submission of specimen other than nasopharyngeal swab, presence of viral mutation(s) within the areas targeted by this assay, and inadequate number of viral copies (<131 copies/mL). A negative result must be combined with clinical observations, patient history, and epidemiological information. The expected result is Negative. Fact Sheet for Patients:  PinkCheek.be Fact Sheet for Healthcare Providers:  GravelBags.it This test is not yet ap proved or cleared by the [place] FDA and  has been authorized for detection and/or diagnosis of SARS-CoV-2 by FDA under an Emergency Use Authorization (EUA). This EUA will remain  in effect (meaning this test can be used) for the duration of the COVID-19 declaration under Section 564(b)(1) of the Act, 21 U.S.C. section 360bbb-3(b)(1), unless the authorization is terminated or revoked sooner.    Influenza A by PCR NEGATIVE NEGATIVE Final   Influenza B by PCR NEGATIVE NEGATIVE Final    Comment: (NOTE) The Xpert Xpress SARS-CoV-2/FLU/RSV assay is intended as an aid in  the diagnosis of influenza from Nasopharyngeal swab specimens and  should not be used as a sole basis for treatment. Nasal washings and  aspirates are unacceptable for Xpert Xpress SARS-CoV-2/FLU/RSV  testing. Fact Sheet for  Patients: PinkCheek.be Fact Sheet for Healthcare Providers: GravelBags.it This test is not yet approved or cleared by the [place] FDA and  has been authorized for detection and/or diagnosis of SARS-CoV-2 by  FDA under an Emergency Use Authorization (EUA). This EUA will remain  in effect (meaning this test can be used) for the duration of the  Covid-19 declaration under Section 564(b)(1) of the Act, 21  U.S.C. section 360bbb-3(b)(1), unless the authorization is  terminated or revoked. Performed at [facility], [place] [address], [place], [place] [address]      Labs: Basic Metabolic Panel: Recent Labs  Lab 06/29/19 [phone] 06/29/19 1313 06/30/19 0410 06/30/19 0539 06/30/19 1531 07/01/19 1244 07/02/19 0937 07/03/19 0930 07/04/19 0857  NA 138   < >   < >  --  137 136 136 135 136  K 3.0*   < >   < >  --  3.2* 3.6 3.3* 3.7 3.4*  CL 97*   < >   < >  --  101 102 103 102 103  CO2 22   < >   < >  --  _0 GLUCOSE 433*   < >   < >  --  266* 254* 144* 158* 152*  BUN 26*   < >   < >  --  18 21* 25* 22* 18  CREATININE 1.47*   < >   < >  --  1.45* 1.85* 1.85* 1.41* 1.11*  CALCIUM 9.2   < >   < >  --  8.3* 7.8* 8.2* 8.4* 8.7*  MG 0.9*  --   --  1.3*  --  1.7  --   --  1.6*   < > = values in this interval not displayed.   Liver Function Tests: Recent Labs  Lab 06/29/19 0928  AST 19  ALT 16  ALKPHOS 122  BILITOT 0.8  PROT 8.0  ALBUMIN 3.5   Recent Labs  Lab 06/29/19 0928  LIPASE 39   No results for input(s): AMMONIA in the last 168 hours. CBC: Recent Labs  Lab 06/29/19 0928 06/30/19 0539 07/02/19 0937 07/03/19 1057 07/04/19 0857  WBC 13.9* 12.4* 10.2 11.1* 11.6*  NEUTROABS 11.2*  --   --   --   --  HGB 10.6* 8.4* 8.1* 7.9* 7.7*  HCT 33.5* 27.4* 26.7* 27.1* 25.4*  MCV 84.4 87.3 90.8 90.0 88.8  PLT 427* 334 311 316 330   Cardiac Enzymes: No results for input(s): CKTOTAL, CKMB,  CKMBINDEX, TROPONINI in the last 168 hours. BNP: BNP (last 3 results) Recent Labs    05/07/19 1451 06/22/19 2123 07/02/19 1327  BNP 127.1* 70.3 61.4    ProBNP (last 3 results) No results for input(s): PROBNP in the last 8760 hours.  CBG: Recent Labs  Lab 07/03/19 1209 07/03/19 1711 07/03/19 2205 07/04/19 0747 07/04/19 1152  GLUCAP 174* 248* 257* 153* 114*       Signed:  Irine Seal MD.  [facility] 07/04/2019, 2:17 PM

## 2019-07-04 NOTE — Progress Notes (Signed)
Discussed with patient discharge instructions, she verbalized agreement and understanding.  Patient to leave in private vehicle with all belongings.

## 2019-07-05 ENCOUNTER — Ambulatory Visit: Payer: Self-pay | Admitting: Licensed Clinical Social Worker

## 2019-07-05 NOTE — Chronic Care Management (AMB) (Signed)
  Social Work Care Management  Unsuccessful Phone Outreach   07/05/2019 Name: PATTE WINKEL MRN: [id number] DOB: 11-04-1965  Referred by: [name], DO, Reason for referral : Care Coordination ([place])   ALDEEN RIGA is a 54 y.o. year old female who sees [name], DO for primary care.  LCSW received referral from inpatient social worker to assist patient with setting up [place] .  LCSW called patient to assess needs reference the above referral. Telephone outreach was unsuccessful. A HIPPA compliant phone message was left for the patient providing contact information and requesting a return call.  Plan:  If no return call is received. LCSW will call again in 3 to 5 days.  [name], [place] / [place]   [phone] 9:46 AM

## 2019-07-09 ENCOUNTER — Other Ambulatory Visit: Payer: Self-pay

## 2019-07-09 ENCOUNTER — Inpatient Hospital Stay (HOSPITAL_COMMUNITY)
Admission: EM | Admit: 2019-07-09 | Discharge: 2019-07-19 | DRG: 074 | Disposition: A | Discharge status: Home / Self Care | Payer: Medicare Other | Attending: Internal Medicine | Admitting: Internal Medicine

## 2019-07-09 ENCOUNTER — Encounter (HOSPITAL_COMMUNITY): Payer: Self-pay | Admitting: Emergency Medicine

## 2019-07-09 DIAGNOSIS — I5022 Chronic systolic (congestive) heart failure: Secondary | ICD-10-CM | POA: Diagnosis present

## 2019-07-09 DIAGNOSIS — F329 Major depressive disorder, single episode, unspecified: Secondary | ICD-10-CM | POA: Diagnosis present

## 2019-07-09 DIAGNOSIS — Z79899 Other long term (current) drug therapy: Secondary | ICD-10-CM

## 2019-07-09 DIAGNOSIS — D631 Anemia in chronic kidney disease: Secondary | ICD-10-CM | POA: Diagnosis present

## 2019-07-09 DIAGNOSIS — I2510 Atherosclerotic heart disease of native coronary artery without angina pectoris: Secondary | ICD-10-CM | POA: Diagnosis present

## 2019-07-09 DIAGNOSIS — E1165 Type 2 diabetes mellitus with hyperglycemia: Secondary | ICD-10-CM | POA: Diagnosis present

## 2019-07-09 DIAGNOSIS — E1122 Type 2 diabetes mellitus with diabetic chronic kidney disease: Secondary | ICD-10-CM | POA: Diagnosis present

## 2019-07-09 DIAGNOSIS — Z20822 Contact with and (suspected) exposure to covid-19: Secondary | ICD-10-CM | POA: Diagnosis present

## 2019-07-09 DIAGNOSIS — G2401 Drug induced subacute dyskinesia: Secondary | ICD-10-CM | POA: Diagnosis not present

## 2019-07-09 DIAGNOSIS — M797 Fibromyalgia: Secondary | ICD-10-CM | POA: Diagnosis present

## 2019-07-09 DIAGNOSIS — D72829 Elevated white blood cell count, unspecified: Secondary | ICD-10-CM | POA: Diagnosis present

## 2019-07-09 DIAGNOSIS — E872 Acidosis, unspecified: Secondary | ICD-10-CM

## 2019-07-09 DIAGNOSIS — R0789 Other chest pain: Secondary | ICD-10-CM | POA: Diagnosis present

## 2019-07-09 DIAGNOSIS — I480 Paroxysmal atrial fibrillation: Secondary | ICD-10-CM | POA: Diagnosis present

## 2019-07-09 DIAGNOSIS — I428 Other cardiomyopathies: Secondary | ICD-10-CM | POA: Diagnosis present

## 2019-07-09 DIAGNOSIS — E876 Hypokalemia: Secondary | ICD-10-CM | POA: Diagnosis present

## 2019-07-09 DIAGNOSIS — Z7901 Long term (current) use of anticoagulants: Secondary | ICD-10-CM

## 2019-07-09 DIAGNOSIS — E785 Hyperlipidemia, unspecified: Secondary | ICD-10-CM | POA: Diagnosis present

## 2019-07-09 DIAGNOSIS — D638 Anemia in other chronic diseases classified elsewhere: Secondary | ICD-10-CM | POA: Diagnosis present

## 2019-07-09 DIAGNOSIS — Z833 Family history of diabetes mellitus: Secondary | ICD-10-CM

## 2019-07-09 DIAGNOSIS — K3184 Gastroparesis: Secondary | ICD-10-CM | POA: Diagnosis present

## 2019-07-09 DIAGNOSIS — N183 Chronic kidney disease, stage 3 unspecified: Secondary | ICD-10-CM | POA: Diagnosis present

## 2019-07-09 DIAGNOSIS — K219 Gastro-esophageal reflux disease without esophagitis: Secondary | ICD-10-CM | POA: Diagnosis present

## 2019-07-09 DIAGNOSIS — G8929 Other chronic pain: Secondary | ICD-10-CM | POA: Diagnosis present

## 2019-07-09 DIAGNOSIS — M109 Gout, unspecified: Secondary | ICD-10-CM | POA: Diagnosis present

## 2019-07-09 DIAGNOSIS — N179 Acute kidney failure, unspecified: Secondary | ICD-10-CM | POA: Diagnosis present

## 2019-07-09 DIAGNOSIS — E1143 Type 2 diabetes mellitus with diabetic autonomic (poly)neuropathy: Secondary | ICD-10-CM

## 2019-07-09 DIAGNOSIS — E860 Dehydration: Secondary | ICD-10-CM | POA: Diagnosis present

## 2019-07-09 DIAGNOSIS — Z794 Long term (current) use of insulin: Secondary | ICD-10-CM

## 2019-07-09 DIAGNOSIS — Z8673 Personal history of transient ischemic attack (TIA), and cerebral infarction without residual deficits: Secondary | ICD-10-CM

## 2019-07-09 DIAGNOSIS — Z6841 Body Mass Index (BMI) 40.0 and over, adult: Secondary | ICD-10-CM

## 2019-07-09 DIAGNOSIS — Z8249 Family history of ischemic heart disease and other diseases of the circulatory system: Secondary | ICD-10-CM

## 2019-07-09 DIAGNOSIS — I130 Hypertensive heart and chronic kidney disease with heart failure and stage 1 through stage 4 chronic kidney disease, or unspecified chronic kidney disease: Secondary | ICD-10-CM | POA: Diagnosis present

## 2019-07-09 DIAGNOSIS — Z8711 Personal history of peptic ulcer disease: Secondary | ICD-10-CM

## 2019-07-09 DIAGNOSIS — Z9119 Patient's noncompliance with other medical treatment and regimen: Secondary | ICD-10-CM

## 2019-07-09 DIAGNOSIS — Z9111 Patient's noncompliance with dietary regimen: Secondary | ICD-10-CM

## 2019-07-09 DIAGNOSIS — R109 Unspecified abdominal pain: Secondary | ICD-10-CM | POA: Diagnosis present

## 2019-07-09 DIAGNOSIS — R112 Nausea with vomiting, unspecified: Secondary | ICD-10-CM | POA: Diagnosis present

## 2019-07-09 MED ORDER — ONDANSETRON HCL 4 MG/2ML IJ SOLN
4.0000 mg | Freq: Once | INTRAMUSCULAR | Status: AC | PRN
  Administered 2019-07-10: 01:00:00 4 mg via INTRAVENOUS
  Filled 2019-07-09: qty 2

## 2019-07-09 MED ORDER — SODIUM CHLORIDE 0.9% FLUSH
3.0000 mL | Freq: Once | INTRAVENOUS | Status: AC
  Administered 2019-07-10: 3 mL via INTRAVENOUS

## 2019-07-09 NOTE — ED Notes (Signed)
Pt O2 is 100% and when I removed the nasal canula that EMS had on her that was not connected to O2 the pt began to breathe harder and asked if I was going to put it back on because it was helping.  I told her it was never connected while in room and that her O2 was 100% and did not need additional O2.

## 2019-07-09 NOTE — ED Notes (Signed)
IV access attempted x2 via [place] and both unsuccessful. Pt advised that midline had been placed on previous admission due to difficulty in obtaining access.

## 2019-07-09 NOTE — ED Triage Notes (Signed)
54 yo female BIB [facility] from home. C/o nausea and vomiting x 2 hours. Pt states she had 8 episodes of emesis in 2 hours. Pt states she was seen here at the [facility] in the past week but is unable to provide information as to why she was here per EMS.   Vitals bp 150 palp Hr 120  rr 32 spo2 99 on room air cbg 109 Temp 97.3 tympanic

## 2019-07-10 ENCOUNTER — Inpatient Hospital Stay (HOSPITAL_COMMUNITY): Payer: Medicare Other

## 2019-07-10 ENCOUNTER — Encounter (HOSPITAL_COMMUNITY): Payer: Self-pay | Admitting: Internal Medicine

## 2019-07-10 ENCOUNTER — Ambulatory Visit: Payer: Medicare Other | Admitting: Family Medicine

## 2019-07-10 DIAGNOSIS — R251 Tremor, unspecified: Secondary | ICD-10-CM | POA: Diagnosis not present

## 2019-07-10 DIAGNOSIS — E785 Hyperlipidemia, unspecified: Secondary | ICD-10-CM | POA: Diagnosis present

## 2019-07-10 DIAGNOSIS — N179 Acute kidney failure, unspecified: Secondary | ICD-10-CM | POA: Diagnosis not present

## 2019-07-10 DIAGNOSIS — I480 Paroxysmal atrial fibrillation: Secondary | ICD-10-CM | POA: Diagnosis present

## 2019-07-10 DIAGNOSIS — G8929 Other chronic pain: Secondary | ICD-10-CM | POA: Diagnosis not present

## 2019-07-10 DIAGNOSIS — E860 Dehydration: Secondary | ICD-10-CM | POA: Diagnosis present

## 2019-07-10 DIAGNOSIS — M797 Fibromyalgia: Secondary | ICD-10-CM | POA: Diagnosis present

## 2019-07-10 DIAGNOSIS — I5022 Chronic systolic (congestive) heart failure: Secondary | ICD-10-CM | POA: Diagnosis not present

## 2019-07-10 DIAGNOSIS — M109 Gout, unspecified: Secondary | ICD-10-CM | POA: Diagnosis present

## 2019-07-10 DIAGNOSIS — E1122 Type 2 diabetes mellitus with diabetic chronic kidney disease: Secondary | ICD-10-CM | POA: Diagnosis not present

## 2019-07-10 DIAGNOSIS — E872 Acidosis, unspecified: Secondary | ICD-10-CM

## 2019-07-10 DIAGNOSIS — K219 Gastro-esophageal reflux disease without esophagitis: Secondary | ICD-10-CM | POA: Diagnosis present

## 2019-07-10 DIAGNOSIS — R109 Unspecified abdominal pain: Secondary | ICD-10-CM | POA: Diagnosis not present

## 2019-07-10 DIAGNOSIS — I428 Other cardiomyopathies: Secondary | ICD-10-CM | POA: Diagnosis not present

## 2019-07-10 DIAGNOSIS — E1143 Type 2 diabetes mellitus with diabetic autonomic (poly)neuropathy: Secondary | ICD-10-CM | POA: Diagnosis not present

## 2019-07-10 DIAGNOSIS — R112 Nausea with vomiting, unspecified: Secondary | ICD-10-CM

## 2019-07-10 DIAGNOSIS — K3184 Gastroparesis: Secondary | ICD-10-CM | POA: Diagnosis not present

## 2019-07-10 DIAGNOSIS — I130 Hypertensive heart and chronic kidney disease with heart failure and stage 1 through stage 4 chronic kidney disease, or unspecified chronic kidney disease: Secondary | ICD-10-CM | POA: Diagnosis not present

## 2019-07-10 DIAGNOSIS — E1165 Type 2 diabetes mellitus with hyperglycemia: Secondary | ICD-10-CM | POA: Diagnosis not present

## 2019-07-10 DIAGNOSIS — Z20822 Contact with and (suspected) exposure to covid-19: Secondary | ICD-10-CM | POA: Diagnosis not present

## 2019-07-10 DIAGNOSIS — Z6841 Body Mass Index (BMI) 40.0 and over, adult: Secondary | ICD-10-CM | POA: Diagnosis not present

## 2019-07-10 DIAGNOSIS — E876 Hypokalemia: Secondary | ICD-10-CM | POA: Diagnosis present

## 2019-07-10 DIAGNOSIS — D72829 Elevated white blood cell count, unspecified: Secondary | ICD-10-CM | POA: Diagnosis present

## 2019-07-10 DIAGNOSIS — D638 Anemia in other chronic diseases classified elsewhere: Secondary | ICD-10-CM | POA: Diagnosis present

## 2019-07-10 DIAGNOSIS — N183 Chronic kidney disease, stage 3 unspecified: Secondary | ICD-10-CM | POA: Diagnosis present

## 2019-07-10 DIAGNOSIS — R0789 Other chest pain: Secondary | ICD-10-CM | POA: Diagnosis present

## 2019-07-10 LAB — MAGNESIUM: Magnesium: 0.7 mg/dL — CL (ref 1.7–2.4)

## 2019-07-10 LAB — COMPREHENSIVE METABOLIC PANEL
ALT: 14 U/L (ref 0–44)
AST: 21 U/L (ref 15–41)
Albumin: 3.6 g/dL (ref 3.5–5.0)
Alkaline Phosphatase: 101 U/L (ref 38–126)
Anion gap: 17 — ABNORMAL HIGH (ref 5–15)
BUN: 23 mg/dL — ABNORMAL HIGH (ref 6–20)
CO2: 25 mmol/L (ref 22–32)
Calcium: 8.9 mg/dL (ref 8.9–10.3)
Chloride: 99 mmol/L (ref 98–111)
Creatinine, Ser: 1.52 mg/dL — ABNORMAL HIGH (ref 0.44–1.00)
GFR calc Af Amer: 44 mL/min — ABNORMAL LOW (ref >60)
GFR calc non Af Amer: 38 mL/min — ABNORMAL LOW (ref >60)
Glucose, Bld: 119 mg/dL — ABNORMAL HIGH (ref 70–99)
Potassium: 2.6 mmol/L — CL (ref 3.5–5.1)
Sodium: 141 mmol/L (ref 135–145)
Total Bilirubin: 1 mg/dL (ref 0.3–1.2)
Total Protein: 8.3 g/dL — ABNORMAL HIGH (ref 6.5–8.1)

## 2019-07-10 LAB — BASIC METABOLIC PANEL WITH GFR
Anion gap: 14 (ref 5–15)
BUN: 20 mg/dL (ref 6–20)
CO2: 24 mmol/L (ref 22–32)
Calcium: 8 mg/dL — ABNORMAL LOW (ref 8.9–10.3)
Chloride: 101 mmol/L (ref 98–111)
Creatinine, Ser: 1.11 mg/dL — ABNORMAL HIGH (ref 0.44–1.00)
GFR calc Af Amer: >60 mL/min
GFR calc non Af Amer: 56 mL/min — ABNORMAL LOW
Glucose, Bld: 244 mg/dL — ABNORMAL HIGH (ref 70–99)
Potassium: 3.4 mmol/L — ABNORMAL LOW (ref 3.5–5.1)
Sodium: 139 mmol/L (ref 135–145)

## 2019-07-10 LAB — RESPIRATORY PANEL BY RT PCR (FLU A&B, COVID)
Influenza A by PCR: NEGATIVE
Influenza B by PCR: NEGATIVE
SARS Coronavirus 2 by RT PCR: NEGATIVE

## 2019-07-10 LAB — BLOOD GAS, VENOUS
Acid-Base Excess: 6.1 mmol/L — ABNORMAL HIGH (ref 0.0–2.0)
Bicarbonate: 28.1 mmol/L — ABNORMAL HIGH (ref 20.0–28.0)
O2 Saturation: 64.3 %
Patient temperature: 98.6
pCO2, Ven: 30.3 mmHg — ABNORMAL LOW (ref 44.0–60.0)
pH, Ven: 7.574 — ABNORMAL HIGH (ref 7.250–7.430)
pO2, Ven: 35 mmHg (ref 32.0–45.0)

## 2019-07-10 LAB — GLUCOSE, CAPILLARY
Glucose-Capillary: 231 mg/dL — ABNORMAL HIGH (ref 70–99)
Glucose-Capillary: 193 mg/dL — ABNORMAL HIGH (ref 70–99)

## 2019-07-10 LAB — LIPASE, BLOOD: Lipase: 24 U/L (ref 11–51)

## 2019-07-10 LAB — CBC
HCT: 25.5 % — ABNORMAL LOW (ref 36.0–46.0)
HCT: 28.5 % — ABNORMAL LOW (ref 36.0–46.0)
Hemoglobin: 7.8 g/dL — ABNORMAL LOW (ref 12.0–15.0)
Hemoglobin: 8.6 g/dL — ABNORMAL LOW (ref 12.0–15.0)
MCH: 26.3 pg (ref 26.0–34.0)
MCH: 26.9 pg (ref 26.0–34.0)
MCHC: 30.6 g/dL (ref 30.0–36.0)
MCHC: 30.2 g/dL (ref 30.0–36.0)
MCV: 85.9 fL (ref 80.0–100.0)
MCV: 89.1 fL (ref 80.0–100.0)
Platelets: 594 10*3/uL — ABNORMAL HIGH (ref 150–400)
Platelets: 452 10*3/uL — ABNORMAL HIGH (ref 150–400)
RBC: 2.97 MIL/uL — ABNORMAL LOW (ref 3.87–5.11)
RBC: 3.2 MIL/uL — ABNORMAL LOW (ref 3.87–5.11)
RDW: 14.9 % (ref 11.5–15.5)
RDW: 15 % (ref 11.5–15.5)
WBC: 18.6 10*3/uL — ABNORMAL HIGH (ref 4.0–10.5)
WBC: 16.2 10*3/uL — ABNORMAL HIGH (ref 4.0–10.5)
nRBC: 0 % (ref 0.0–0.2)
nRBC: 0 % (ref 0.0–0.2)

## 2019-07-10 LAB — RAPID URINE DRUG SCREEN, HOSP PERFORMED
Amphetamines: NOT DETECTED
Barbiturates: NOT DETECTED
Benzodiazepines: NOT DETECTED
Cocaine: NOT DETECTED
Opiates: NOT DETECTED
Tetrahydrocannabinol: NOT DETECTED

## 2019-07-10 LAB — CBG MONITORING, ED
Glucose-Capillary: 155 mg/dL — ABNORMAL HIGH (ref 70–99)
Glucose-Capillary: 213 mg/dL — ABNORMAL HIGH (ref 70–99)
Glucose-Capillary: 199 mg/dL — ABNORMAL HIGH (ref 70–99)

## 2019-07-10 LAB — ETHANOL: Alcohol, Ethyl (B): <10 mg/dL (ref <10)

## 2019-07-10 LAB — LACTIC ACID, PLASMA
Lactic Acid, Venous: 3.2 mmol/L (ref 0.5–1.9)
Lactic Acid, Venous: 2.1 mmol/L (ref 0.5–1.9)

## 2019-07-10 LAB — PROCALCITONIN: Procalcitonin: <0.1 ng/mL

## 2019-07-10 LAB — BETA-HYDROXYBUTYRIC ACID: Beta-Hydroxybutyric Acid: 0.29 mmol/L — ABNORMAL HIGH (ref 0.05–0.27)

## 2019-07-10 LAB — HEMOGLOBIN A1C
Hgb A1c MFr Bld: 8.4 % — ABNORMAL HIGH (ref 4.8–5.6)
Mean Plasma Glucose: 194.38 mg/dL

## 2019-07-10 LAB — TROPONIN I (HIGH SENSITIVITY): Troponin I (High Sensitivity): 30 ng/L — ABNORMAL HIGH

## 2019-07-10 MED ORDER — APIXABAN 5 MG PO TABS
5.0000 mg | ORAL_TABLET | Freq: Two times a day (BID) | ORAL | Status: DC
  Administered 2019-07-10 – 2019-07-19 (×18): 5 mg via ORAL
  Filled 2019-07-10 (×19): qty 1

## 2019-07-10 MED ORDER — SODIUM CHLORIDE 0.9 % IV SOLN: INTRAVENOUS | Status: AC

## 2019-07-10 MED ORDER — POTASSIUM CHLORIDE 10 MEQ/100ML IV SOLN
10.0000 meq | INTRAVENOUS | Status: AC
  Administered 2019-07-10 (×4): 10 meq via INTRAVENOUS
  Filled 2019-07-10 (×4): qty 100

## 2019-07-10 MED ORDER — INSULIN GLARGINE 100 UNIT/ML ~~LOC~~ SOLN: 20.0000 [IU] | Freq: Every day | SUBCUTANEOUS | Status: DC

## 2019-07-10 MED ORDER — LACTATED RINGERS IV BOLUS
1000.0000 mL | Freq: Once | INTRAVENOUS | Status: AC
  Administered 2019-07-10: 1000 mL via INTRAVENOUS

## 2019-07-10 MED ORDER — MORPHINE SULFATE (PF) 2 MG/ML IV SOLN: 2.0000 mg | INTRAVENOUS | Status: DC | PRN

## 2019-07-10 MED ORDER — HYDROMORPHONE HCL 1 MG/ML IJ SOLN
0.5000 mg | Freq: Once | INTRAMUSCULAR | Status: AC
  Administered 2019-07-10: 0.5 mg via INTRAVENOUS
  Filled 2019-07-10: qty 1

## 2019-07-10 MED ORDER — MAGNESIUM SULFATE 4 GM/100ML IV SOLN
4.0000 g | Freq: Once | INTRAVENOUS | Status: AC
  Administered 2019-07-10: 05:00:00 4 g via INTRAVENOUS
  Filled 2019-07-10: qty 100

## 2019-07-10 MED ORDER — APIXABAN 5 MG PO TABS: 5.0000 mg | ORAL_TABLET | Freq: Two times a day (BID) | ORAL | Status: DC

## 2019-07-10 MED ORDER — METOPROLOL SUCCINATE ER 25 MG PO TB24
25.0000 mg | ORAL_TABLET | Freq: Every day | ORAL | Status: DC
  Administered 2019-07-10 – 2019-07-19 (×10): 25 mg via ORAL
  Filled 2019-07-10 (×10): qty 1

## 2019-07-10 MED ORDER — HYDROMORPHONE HCL 1 MG/ML IJ SOLN
1.0000 mg | INTRAMUSCULAR | Status: DC | PRN
  Administered 2019-07-10 – 2019-07-14 (×16): 1 mg via INTRAVENOUS
  Filled 2019-07-10 (×16): qty 1

## 2019-07-10 MED ORDER — DULOXETINE HCL 20 MG PO CPEP
40.0000 mg | ORAL_CAPSULE | Freq: Two times a day (BID) | ORAL | Status: DC
  Administered 2019-07-10 – 2019-07-19 (×19): 40 mg via ORAL
  Filled 2019-07-10 (×20): qty 2

## 2019-07-10 MED ORDER — PROMETHAZINE HCL 25 MG/ML IJ SOLN
25.0000 mg | Freq: Four times a day (QID) | INTRAMUSCULAR | Status: DC | PRN
  Administered 2019-07-10: 17:00:00 12.5 mg via INTRAVENOUS
  Administered 2019-07-11 – 2019-07-17 (×17): 25 mg via INTRAVENOUS
  Filled 2019-07-10 (×18): qty 1

## 2019-07-10 MED ORDER — ERYTHROMYCIN BASE 250 MG PO TABS
250.0000 mg | ORAL_TABLET | Freq: Three times a day (TID) | ORAL | Status: DC
  Administered 2019-07-10 – 2019-07-19 (×37): 250 mg via ORAL
  Filled 2019-07-10 (×40): qty 1

## 2019-07-10 MED ORDER — INSULIN ASPART 100 UNIT/ML ~~LOC~~ SOLN
0.0000 [IU] | Freq: Three times a day (TID) | SUBCUTANEOUS | Status: DC
  Administered 2019-07-10 (×2): 5 [IU] via SUBCUTANEOUS
  Administered 2019-07-10 – 2019-07-11 (×3): 3 [IU] via SUBCUTANEOUS
  Administered 2019-07-11: 17:00:00 8 [IU] via SUBCUTANEOUS
  Administered 2019-07-12: 5 [IU] via SUBCUTANEOUS
  Administered 2019-07-12: 8 [IU] via SUBCUTANEOUS
  Administered 2019-07-12 – 2019-07-13 (×4): 3 [IU] via SUBCUTANEOUS
  Administered 2019-07-14: 5 [IU] via SUBCUTANEOUS
  Administered 2019-07-14: 3 [IU] via SUBCUTANEOUS
  Administered 2019-07-14: 5 [IU] via SUBCUTANEOUS
  Administered 2019-07-15: 3 [IU] via SUBCUTANEOUS
  Administered 2019-07-15: 11 [IU] via SUBCUTANEOUS
  Administered 2019-07-15: 3 [IU] via SUBCUTANEOUS
  Administered 2019-07-16: 2 [IU] via SUBCUTANEOUS
  Administered 2019-07-16: 8 [IU] via SUBCUTANEOUS
  Administered 2019-07-16: 11 [IU] via SUBCUTANEOUS
  Administered 2019-07-17 (×2): 5 [IU] via SUBCUTANEOUS
  Administered 2019-07-17: 3 [IU] via SUBCUTANEOUS
  Administered 2019-07-18: 5 [IU] via SUBCUTANEOUS
  Administered 2019-07-18: 3 [IU] via SUBCUTANEOUS
  Administered 2019-07-18: 19:00:00 5 [IU] via SUBCUTANEOUS
  Administered 2019-07-19: 3 [IU] via SUBCUTANEOUS
  Administered 2019-07-19: 8 [IU] via SUBCUTANEOUS
  Filled 2019-07-10: qty 0.15

## 2019-07-10 MED ORDER — ATORVASTATIN CALCIUM 10 MG PO TABS
10.0000 mg | ORAL_TABLET | Freq: Every day | ORAL | Status: DC
  Administered 2019-07-10 – 2019-07-19 (×10): 10 mg via ORAL
  Filled 2019-07-10 (×10): qty 1

## 2019-07-10 MED ORDER — PANTOPRAZOLE SODIUM 40 MG PO TBEC
40.0000 mg | DELAYED_RELEASE_TABLET | Freq: Two times a day (BID) | ORAL | Status: DC
  Administered 2019-07-10 – 2019-07-19 (×20): 40 mg via ORAL
  Filled 2019-07-10 (×20): qty 1

## 2019-07-10 MED ORDER — MAGNESIUM SULFATE 2 GM/50ML IV SOLN
2.0000 g | Freq: Once | INTRAVENOUS | Status: AC
  Administered 2019-07-10: 2 g via INTRAVENOUS
  Filled 2019-07-10: qty 50

## 2019-07-10 MED ORDER — INSULIN GLARGINE 100 UNIT/ML ~~LOC~~ SOLN
25.0000 [IU] | Freq: Every day | SUBCUTANEOUS | Status: DC
  Administered 2019-07-10 – 2019-07-16 (×7): 25 [IU] via SUBCUTANEOUS
  Filled 2019-07-10 (×8): qty 0.25

## 2019-07-10 MED ORDER — POTASSIUM CHLORIDE 10 MEQ/100ML IV SOLN
10.0000 meq | INTRAVENOUS | Status: AC
  Administered 2019-07-10 (×5): 10 meq via INTRAVENOUS
  Filled 2019-07-10 (×5): qty 100

## 2019-07-10 NOTE — Progress Notes (Signed)
PROGRESS NOTE    Deborah [name]  PFY:[id number] DOB: January 23, 1966 DOA: 07/09/2019 PCP: [name], DO    Brief Narrative:  Deborah [name] is a 54 y.o. female with medical history significant of chronic systolic CHF (EF 40 to 81% on echo done 01/2019), paroxysmal A. fib on Eliquis, insulin-dependent type 2 diabetes with history of recurrent admissions for DKA (most recent 4/23-4/28), CKD stage III, hypertension, hyperlipidemia, CVA, gout, diabetic gastroparesis intolerant to Reglan, gastric ulcer, GERD, anemia of chronic disease, chronic pain, depression, morbid obesity (BMI 54.07) presenting to the ED via EMS with complaints of nausea and vomiting.  Patient states yesterday afternoon she had a ham sandwich and soon after started vomiting.  She also had some substernal sharp chest pain and shortness of breath at that time.  She has continued to have 10 out of 10 intensity generalized abdominal pain since then.  Also continue to have some chest pain since then.  She has continued to vomit and has not been able to tolerate any p.o. intake.  Does report having a normal bowel movement in the afternoon.  She does not recall taking erythromycin since her recent hospital discharge.  States her blood glucose has been in the 200s at home.  In the ED, afebrile.  Tachycardic and tachypneic on arrival.  Not hypoxic.  WBC count 18.6.  Lactic acid 3.2.  Hemoglobin 7.8, stable compared to labs done during recent hospitalization.  Platelet count 594, previously normal.  Potassium 2.6.  Magnesium level 0.7.  Bicarb 25, anion gap 17.  Blood glucose 119.  BUN 23, creatinine 1.5.  Creatinine was 1.1 at the time of recent hospital discharge. Lipase and LFTs normal.  UA pending.  Blood ethanol level undetectable.  Blood culture x2 pending.  UDS pending.  SARS-CoV-2 PCR test negative.  Influenza panel negative. No imaging done. Patient received Dilaudid, 1 L LR bolus, IV magnesium 2 g, IV potassium  supplementation, and Zofran.   Assessment & Plan:   Principal Problem:   Intractable nausea and vomiting Active Problems:   Hypokalemia   Leukocytosis   Abdominal pain   Lactic acidosis   Abdominal pain, intractable nausea and vomiting:  Patient presenting with acute onset nausea/vomiting and abdominal pain.  Suspicion for diabetic gastroparesis.  Patient is afebrile, but with elevated WBC count of 16.2 and a lactic acid of 3.2.  Lipase and LFTs within normal limits.  CT abdomen/pelvis with no acute findings. --Review of previous gastric emptying study 2018 that was unrevealing --N.p.o. for now --NS at 75 mL's per hour --Plan to start erythromycin 50 mg p.o. 3 times daily with meals and nightly when diet advanced --Supportive care, antiemetics  Atypical chest pain:  EKG without acute ischemic changes.    High-sensitivity troponin 30. --Continue to trend troponin --Continue to monitor on telemetry  Leukocytosis:  Etiology likely reactive from dehydration.  Unlikely infectious etiology with normal procalcitonin.  No acute findings on CT abdomen/pelvis.  --Urinalysis: Pending --Blood cultures x2: Pending --Continue to monitor CBC daily  Lactic acidosis:  Likely secondary to dehydration.  Unlikely infectious process.  Procalcitonin less than 0.10. Blood ethanol level undetectable.    CT abdomen/pelvis negative. --Lactic Acid 3.2-->2.1 --Repeat lactic acid in a.m.  Hypokalemia:  Likely related to decreased p.o. intake from intractable nausea/vomiting and home diuretic use.  Severe hypomagnesemia also likely contributing.  EKG without acute changes.   --Holding home torsemide --Continue to monitor electrolytes daily  Severe Hypomagnesemia: Continue to replete and monitor closely.  AKI  on CKD stage III:  BUN 23, creatinine 1.5, creatinine was 1.1 at the time of recent hospital discharge.  Suspect prerenal from dehydration and home diuretic use.   --Holding home  diuretic --Continue NS at 75 mL's per hour --Avoid nephrotoxins, renally dose all medications --Repeat BMP in a.m.  Poorly controlled insulin-dependent type 2 diabetes:  Hemoglobin A1c A1c 8.4.   Home regimen includes Lantus 45 units subcutaneously daily, NovoLog 15 subcutaneously 3 times daily AC. --Diabetic educator following, appreciate assistance --Restart Lantus at a lower dose, 25 units of insulin daily --Insulin sliding scale for coverage --CBGs every 4 hours  Chronic systolic CHF:  EF 40 to 23% on TTE 01/2019.  No signs of volume overload at this time. --Currently holding home torsemide --Continue metoprolol succinate 25 mg p.o. daily  Paroxysmal A. fib:  Currently in sinus rhythm.  Continue home metoprolol and Eliquis  GERD: Continue PPI   DVT prophylaxis: Eliquis Code Status: Full code Family Communication: Discussed with patient extensively at bedside  Disposition Plan:  Status is: Inpatient  Remains inpatient appropriate because:Persistent severe electrolyte disturbances, Ongoing active pain requiring inpatient pain management, Unsafe d/c plan, IV treatments appropriate due to intensity of illness or inability to take PO and Inpatient level of care appropriate due to severity of illness   Dispo: The patient is from: Home              Anticipated d/c is to: Home              Anticipated d/c date is: 2 days              Patient currently is not medically stable to d/c.   Consultants:   None  Procedures:   None  Antimicrobials:   None   Subjective: Patient seen and examined at bedside, continues to hold in the ED.  Continues with some nausea and abdominal discomfort, slightly improved since presentation.  Reports poor appetite, lack of interest.  No other complaints or concerns at this time.  Currently denies headache, no chest pain, no palpitations, no shortness of breath, no cough/congestion, no weakness, no fatigue.  No acute events overnight per  nursing staff.  Objective: Vitals:   07/10/19 1100 07/10/19 1200 07/10/19 1235 07/10/19 1300  BP: 104/61 (!) 104/54 (!) 106/59 109/61  Pulse: 91 89 89 90  Resp: 15 14 (!) 21 (!) 23  Temp:      TempSrc:      SpO2: 100% 91% 91% 91%  Weight:      Height:        Intake/Output Summary (Last 24 hours) at 07/10/2019 1415 Last data filed at 07/10/2019 1336 Gross per 24 hour  Intake 385.31 ml  Output --  Net 385.31 ml   Filed Weights   07/09/19 2240  Weight: (!) 152 kg    Examination:  General exam: Appears calm and comfortable  Respiratory system: Clear to auscultation. Respiratory effort normal. Cardiovascular system: S1 & S2 heard, RRR. No JVD, murmurs, rubs, gallops or clicks. No pedal edema. Gastrointestinal system: Abdomen is nondistended, soft and mild generalized tenderness to palpation. No organomegaly or masses felt. Normal bowel sounds heard. Central nervous system: Alert and oriented. No focal neurological deficits. Extremities: Symmetric 5 x 5 power. Skin: No rashes, lesions or ulcers Psychiatry: Judgement and insight appear normal. Mood & affect appropriate.     Data Reviewed: I have personally reviewed following labs and imaging studies  CBC: Recent Labs  Lab 07/04/19 0857 07/09/19 2249  07/10/19 0454  WBC 11.6* 18.6* 16.2*  HGB 7.7* 7.8* 8.6*  HCT 25.4* 25.5* 28.5*  MCV 88.8 85.9 89.1  PLT 330 594* 315*   Basic Metabolic Panel: Recent Labs  Lab 07/04/19 0857 07/09/19 2249 07/10/19 0454  NA 136 141 139  K 3.4* 2.6* 3.4*  CL 103 99 101  CO2 26 25 24   GLUCOSE 152* 119* 244*  BUN 18 23* 20  CREATININE 1.11* 1.52* 1.11*  CALCIUM 8.7* 8.9 8.0*  MG 1.6* 0.7*  --    GFR: Estimated Creatinine Clearance: 87.1 mL/min (A) (by C-G formula based on SCr of 1.11 mg/dL (H)). Liver Function Tests: Recent Labs  Lab 07/09/19 2249  AST 21  ALT 14  ALKPHOS 101  BILITOT 1.0  PROT 8.3*  ALBUMIN 3.6   Recent Labs  Lab 07/09/19 2249  LIPASE 24   No  results for input(s): AMMONIA in the last 168 hours. Coagulation Profile: No results for input(s): INR, PROTIME in the last 168 hours. Cardiac Enzymes: No results for input(s): CKTOTAL, CKMB, CKMBINDEX, TROPONINI in the last 168 hours. BNP (last 3 results) No results for input(s): PROBNP in the last 8760 hours. HbA1C: Recent Labs    07/10/19 0454  HGBA1C 8.4*   CBG: Recent Labs  Lab 07/04/19 0747 07/04/19 1152 07/10/19 0115 07/10/19 0736 07/10/19 1154  GLUCAP 153* 114* 155* 213* 199*   Lipid Profile: No results for input(s): CHOL, HDL, LDLCALC, TRIG, CHOLHDL, LDLDIRECT in the last 72 hours. Thyroid Function Tests: No results for input(s): TSH, T4TOTAL, FREET4, T3FREE, THYROIDAB in the last 72 hours. Anemia Panel: No results for input(s): VITAMINB12, FOLATE, FERRITIN, TIBC, IRON, RETICCTPCT in the last 72 hours. Sepsis Labs: Recent Labs  Lab 07/10/19 0119 07/10/19 0454  PROCALCITON  --  <0.10  LATICACIDVEN 3.2* 2.1*    Recent Results (from the past 240 hour(s))  Respiratory Panel by RT PCR (Flu A&B, Covid) - Nasopharyngeal Swab     Status: None   Collection Time: 07/10/19  1:19 AM   Specimen: Nasopharyngeal Swab  Result Value Ref Range Status   SARS Coronavirus 2 by RT PCR NEGATIVE NEGATIVE Final    Comment: (NOTE) SARS-CoV-2 target nucleic acids are NOT DETECTED. The SARS-CoV-2 RNA is generally detectable in upper respiratoy specimens during the acute phase of infection. The lowest concentration of SARS-CoV-2 viral copies this assay can detect is 131 copies/mL. A negative result does not preclude SARS-Cov-2 infection and should not be used as the sole basis for treatment or other patient management decisions. A negative result may occur with  improper specimen collection/handling, submission of specimen other than nasopharyngeal swab, presence of viral mutation(s) within the areas targeted by this assay, and inadequate number of viral copies (<131 copies/mL). A  negative result must be combined with clinical observations, patient history, and epidemiological information. The expected result is Negative. Fact Sheet for Patients:  PinkCheek.be Fact Sheet for Healthcare Providers:  GravelBags.it This test is not yet ap proved or cleared by the [place] FDA and  has been authorized for detection and/or diagnosis of SARS-CoV-2 by FDA under an Emergency Use Authorization (EUA). This EUA will remain  in effect (meaning this test can be used) for the duration of the COVID-19 declaration under Section 564(b)(1) of the Act, 21 U.S.C. section 360bbb-3(b)(1), unless the authorization is terminated or revoked sooner.    Influenza A by PCR NEGATIVE NEGATIVE Final   Influenza B by PCR NEGATIVE NEGATIVE Final    Comment: (NOTE) The Xpert Xpress  SARS-CoV-2/FLU/RSV assay is intended as an aid in  the diagnosis of influenza from Nasopharyngeal swab specimens and  should not be used as a sole basis for treatment. Nasal washings and  aspirates are unacceptable for Xpert Xpress SARS-CoV-2/FLU/RSV  testing. Fact Sheet for Patients: PinkCheek.be Fact Sheet for Healthcare Providers: GravelBags.it This test is not yet approved or cleared by the [place] FDA and  has been authorized for detection and/or diagnosis of SARS-CoV-2 by  FDA under an Emergency Use Authorization (EUA). This EUA will remain  in effect (meaning this test can be used) for the duration of the  Covid-19 declaration under Section 564(b)(1) of the Act, 21  U.S.C. section 360bbb-3(b)(1), unless the authorization is  terminated or revoked. Performed at [facility], [place] [address], [place], [place] [address]          Radiology Studies: CT ABDOMEN PELVIS WO CONTRAST  Result Date: 07/10/2019 CLINICAL DATA:  Nausea and vomiting for 2 hours EXAM: CT  ABDOMEN AND PELVIS WITHOUT CONTRAST TECHNIQUE: Multidetector CT imaging of the abdomen and pelvis was performed following the standard protocol without IV contrast. COMPARISON:  06/10/2019 FINDINGS: Lower chest:  No contributory findings. Hepatobiliary: No focal liver abnormality.Cholecystectomy without bile duct dilatation. Pancreas: Unremarkable. Spleen: Unremarkable. Adrenals/Urinary Tract: Negative adrenals. No hydronephrosis or stone. Unremarkable bladder. Stomach/Bowel:  No obstruction. No appendicitis. Vascular/Lymphatic: No acute vascular abnormality. No mass or adenopathy. Reproductive:5 cm left adnexal mass is an exophytic fibroid by 2019 pelvic MRI. Pelvic floor laxity. Other: No ascites or pneumoperitoneum. Musculoskeletal: No acute abnormalities. Spondylosis and advanced lower lumbar facet osteoarthritis. Biforaminal impingement at L4-5 where there is jild anterolisthesis. IMPRESSION: 1. No acute finding including bowel obstruction or visible inflammation. 2. Chronic findings are stable from CT 1 month ago and described above. Electronically Signed   By: Monte Fantasia M.D.   On: 07/10/2019 04:55        Scheduled Meds: . DULoxetine  40 mg Oral BID  . erythromycin  250 mg Oral TID WC & HS  . insulin aspart  0-15 Units Subcutaneous TID WC  . insulin glargine  20 Units Subcutaneous QHS  . metoprolol succinate  25 mg Oral Daily  . pantoprazole  40 mg Oral BID   Continuous Infusions: . sodium chloride 75 mL/hr at 07/10/19 0500     LOS: 0 days    Time spent: 36 minutes spent on chart review, discussion with nursing staff, consultants, updating family and interview/physical exam; more than 50% of that time was spent in counseling and/or coordination of care.    Christinia Lambeth J [place] DO [facility] Available via Epic secure chat 7am-7pm After these hours, please refer to coverage provider listed on [email or web address] 07/10/2019, 2:15 PM

## 2019-07-10 NOTE — ED Notes (Signed)
x2 failed attempts for second set of blood cultures.

## 2019-07-10 NOTE — ED Notes (Signed)
Date and time results received: 07/10/19 1:10 AM   Test: mag  Critical Value: 0.7  Name of Provider Notified: Benjamine Mola

## 2019-07-10 NOTE — ED Provider Notes (Signed)
[facility] DEPT Provider Note   [name]: [id number] Arrival date & time: 07/09/19  2227     History Chief Complaint  Patient presents with  . Emesis    Deborah [name] is a 54 y.o. female with a past medical history of CHF, DM, fibromyalgia, nonischemic cardiomyopathy, stroke, A. Fib who presents today for evaluation of chest and abdominal pain with N/V.  She reports that about 2 hours ago she began having pain in her abdomen and chest, similar to when she was recently admitted.  She has vomited 8 times in the past 2 hours.  She denies any fevers.  She states that this feels like her usual pain however is worse. She reports that prior to her pain starting 2 hours ago she felt well.  She denies any syncope.   Level 5 caveat for acuity of condition. HPI     Past Medical History:  Diagnosis Date  . Acute back pain with sciatica, left   . Acute back pain with sciatica, right   . AKI (acute kidney injury) ([place])   . Anemia, unspecified   . Chronic pain   . Chronic systolic CHF (congestive heart failure) ([place])   . Diabetes mellitus   . Esophageal reflux   . Fibromyalgia   . Gastric ulcer   . Gastroparesis   . Gout   . Hyperlipidemia   . Hypertension   . Lumbosacral stenosis   . NICM (nonischemic cardiomyopathy) ([place])   . Obesity   . PAF (paroxysmal atrial fibrillation) ([place])   . Stroke ([place]) 02/2011  . Vitamin B12 deficiency anemia     Patient Active Problem List   Diagnosis Date Noted  . Nonalcoholic fatty liver disease 06/05/2019  . Chronic diarrhea   . COPD exacerbation ([place]) 05/08/2019  . Acute on chronic HFrEF (heart failure with reduced ejection fraction) ([place])   . High risk social situation   . Restrictive lung disease secondary to obesity   . Physical deconditioning   . [name] 05/07/2019  . History of gastric ulcer   . Uncontrolled type 2 diabetes mellitus with hyperglycemia ([place])   . Fibroid uterus 02/23/2019  .  Congestion of nasal sinus 01/24/2019  . Chronic systolic CHF (congestive heart failure) ([place]) 12/19/2018  . Intractable nausea and vomiting 12/17/2018  . Hypoxia 12/17/2018  . Hyperglycemia 12/17/2018  . Elevated troponin I level   . Vomiting 07/18/2018  . Abdominal pain 07/17/2018  . Hyperkalemia 07/17/2018  . Cardiac arrest ([place]) 11/29/2017  . Pelvic mass 11/29/2017  . Leukocytosis 11/29/2017  . Anxiety 11/29/2017  . Allergic reaction to contrast media, initial encounter 11/28/2017  . Palliative care encounter   . Back pain 03/19/2017  . Stroke (cerebrum) ([place]) 03/19/2017  . GERD (gastroesophageal reflux disease) 03/19/2017  . Depression 03/19/2017  . Morbid obesity ([place])   . Urinary tract infection 08/16/2016  . Normocytic normochromic anemia 08/16/2016  . Gastroparesis 08/16/2016  . Intractable vomiting with nausea 06/17/2016  . Diabetic gastroparesis ([place]) 06/05/2016  . Gout 06/05/2016  . AKI (acute kidney injury) ([place]) 12/06/2015  . Chest pain 09/26/2015  . Hypokalemia 09/26/2015  . Hypomagnesemia 09/26/2015  . Nausea and vomiting 08/20/2015  . Gout flare 05/27/2015  . Lower abdominal pain 05/26/2015  . DKA (diabetic ketoacidoses) ([place]) 05/25/2015  . Uncontrolled type 2 diabetes mellitus with diabetic neuropathy, with long-term current use of insulin ([place]) 05/25/2015  . Type 2 diabetes mellitus with hyperglycemia, with long-term current use of insulin ([place]) 05/25/2015  .  CKD (chronic kidney disease), stage II 05/25/2015  . Essential hypertension, benign 09/28/2013    Past Surgical History:  Procedure Laterality Date  . CATARACT EXTRACTION  01/2014  . CHOLECYSTECTOMY       OB History   No obstetric history on file.     Family History  Problem Relation Age of Onset  . Diabetes Mother   . Diabetes Father   . Heart disease Father   . Diabetes Sister   . Congestive Heart Failure Sister 63  . Diabetes Brother     Social History   Tobacco Use  . Smoking  status: Never Smoker  . Smokeless tobacco: Never Used  Substance Use Topics  . Alcohol use: No  . Drug use: No    Home Medications Prior to Admission medications   Medication Sig Start Date End Date Taking? Authorizing Provider  albuterol (PROVENTIL) (2.5 MG/3ML) 0.083% nebulizer solution Take 3 mLs (2.5 mg total) by nebulization every 6 (six) hours as needed for wheezing or shortness of breath. 04/06/19   Scot Jun, FNP  apixaban (ELIQUIS) 5 MG TABS tablet Take 1 tablet (5 mg total) by mouth 2 (two) times daily. 05/09/19   Gifford Shave, MD  atorvastatin (LIPITOR) 10 MG tablet Take 1 tablet (10 mg total) by mouth daily. 12/13/18   [name], DO  blood glucose meter kit and supplies KIT Dispense based on patient and insurance preference. Use up to four times daily as directed. (FOR ICD-9 250.00, 250.01). 12/13/18   [name], DO  cetirizine (ZYRTEC) 10 MG tablet Take 1 tablet (10 mg total) by mouth daily. 12/13/18   [name], DO  dicyclomine (BENTYL) 20 MG tablet TAKE 1 TABLET (20 MG TOTAL) BY MOUTH 3 (THREE) TIMES DAILY AS NEEDED FOR SPASMS (ABDOMINAL CRAMPING). Patient not taking: Reported on 06/29/2019 06/22/19 07/22/19  [name], DO  DULoxetine HCl 40 MG CPEP TAKE ONE CAPSULE BY MOUTH TWICE DAILY Patient taking differently: Take 40 mg by mouth in the morning and at bedtime.  06/04/19   [name], DO  erythromycin (E-MYCIN) 250 MG tablet Take 1 tablet (250 mg total) by mouth 4 (four) times daily -  with meals and at bedtime. 07/04/19   Eugenie Filler, MD  fluticasone [facility]) 50 MCG/ACT nasal spray Place 2 sprays into both nostrils daily as needed for allergies or rhinitis. 12/19/18   Rai, Ripudeep K, MD  hydrALAZINE (APRESOLINE) 25 MG tablet TAKE ONE TABLET BY MOUTH THREE TIMES A DAY. FOR BLOOD PRESSURE Patient taking differently: Take 25 mg by mouth 3 (three) times daily. For blood pressure 04/19/19   Lockamy, Timothy, DO  insulin aspart (NOVOLOG  FLEXPEN) 100 UNIT/ML FlexPen Inject 15 Units into the skin 3 (three) times daily with meals. Patient taking differently: Inject 30 Units into the skin 3 (three) times daily with meals.  06/26/19   Mariel Aloe, MD  isosorbide mononitrate (IMDUR) 60 MG 24 hr tablet Take 1 tablet (60 mg total) by mouth daily. 07/05/19   Eugenie Filler, MD  LANTUS SOLOSTAR 100 UNIT/ML Solostar Pen Inject 45 Units into the skin at bedtime. 06/14/19   Pokhrel, Corrie Mckusick, MD  loperamide (IMODIUM) 2 MG capsule Take 2 mg by mouth 4 (four) times daily as needed for diarrhea or loose stools.    [provider]  magnesium oxide (MAG-OX) 400 (241.3 Mg) MG tablet Take 1 tablet (400 mg total) by mouth 2 (two) times daily. 06/14/19   Pokhrel, Corrie Mckusick, MD  Melatonin 3 MG  TABS Take 2 tablets (6 mg total) by mouth at bedtime. 12/13/18   [name], DO  metoprolol succinate (TOPROL-XL) 25 MG 24 hr tablet Take 25 mg by mouth daily. 06/01/19   [provider]  nitroGLYCERIN (NITROSTAT) 0.4 MG SL tablet Place 1 tablet (0.4 mg total) under the tongue every 5 (five) minutes as needed for chest pain. 12/13/18   Lockamy, Christia [place], DO  ondansetron (ZOFRAN-ODT) 4 MG disintegrating tablet Take 4 mg by mouth every 8 (eight) hours as needed for nausea or vomiting.  06/07/19   [provider]  pantoprazole (PROTONIX) 40 MG tablet Take 1 tablet (40 mg total) by mouth 2 (two) times daily. 07/04/19 08/03/19  Eugenie Filler, MD  torsemide (DEMADEX) 20 MG tablet Take 2 tablets (40 mg total) by mouth daily. Hold if having significant vomiting 07/06/19   Eugenie Filler, MD    Allergies    Contrast media [iodinated diagnostic agents], Diazepam, Isovue [iopamidol], Lisinopril, Penicillins, Acetaminophen, Tolmetin, Aspirin, Food, Nsaids, and Tramadol  Review of Systems   Review of Systems  Unable to perform ROS: Acuity of condition    Physical Exam Updated Vital Signs BP (!) 164/106 (BP Location: Left Arm)   Pulse (!)  121   Temp 98.1 F (36.7 C) (Oral)   Resp (!) 21   Ht _0  (1.676 m)   Wt (!) 152 kg   LMP 10/10/2012   SpO2 100%   BMI 54.07 kg/m   Physical Exam Vitals and nursing note reviewed.  Constitutional:      General: She is in acute distress.     Appearance: She is well-developed. She is obese.  HENT:     Head: Normocephalic and atraumatic.     Mouth/Throat:     Mouth: Mucous membranes are dry.  Eyes:     Conjunctiva/sclera: Conjunctivae normal.  Cardiovascular:     Rate and Rhythm: Regular rhythm. Tachycardia present.     Pulses: Normal pulses.     Heart sounds: No murmur.  Pulmonary:     Effort: Pulmonary effort is normal. No respiratory distress.     Breath sounds: Normal breath sounds.  Abdominal:     Palpations: Abdomen is soft.     Tenderness: There is abdominal tenderness (Generalized).  Musculoskeletal:     Cervical back: Normal range of motion and neck supple.     Right lower leg: Edema present.     Left lower leg: Edema present.  Skin:    General: Skin is warm and dry.  Neurological:     General: No focal deficit present.     Mental Status: She is alert.     Cranial Nerves: No cranial nerve deficit.  Psychiatric:        Behavior: Behavior normal.     ED Results / Procedures / Treatments   Labs (all labs ordered are listed, but only abnormal results are displayed) Labs Reviewed  COMPREHENSIVE METABOLIC PANEL - Abnormal; Notable for the following components:      Result Value   Potassium 2.6 (*)    Glucose, Bld 119 (*)    BUN 23 (*)    Creatinine, Ser 1.52 (*)    Total Protein 8.3 (*)    GFR calc non Af Amer 38 (*)    GFR calc Af Amer 44 (*)    Anion gap 17 (*)    All other components within normal limits  CBC - Abnormal; Notable for the following components:   WBC 18.6 (*)  RBC 2.97 (*)    Hemoglobin 7.8 (*)    HCT 25.5 (*)    Platelets 594 (*)    All other components within normal limits  LACTIC ACID, PLASMA - Abnormal; Notable for the  following components:   Lactic Acid, Venous 3.2 (*)    All other components within normal limits  MAGNESIUM - Abnormal; Notable for the following components:   Magnesium 0.7 (*)    All other components within normal limits  BLOOD GAS, VENOUS - Abnormal; Notable for the following components:   pH, Ven 7.574 (*)    pCO2, Ven 30.3 (*)    Bicarbonate 28.1 (*)    Acid-Base Excess 6.1 (*)    All other components within normal limits  HEMOGLOBIN A1C - Abnormal; Notable for the following components:   Hgb A1c MFr Bld 8.4 (*)    All other components within normal limits  CBC - Abnormal; Notable for the following components:   WBC 16.2 (*)    RBC 3.20 (*)    Hemoglobin 8.6 (*)    HCT 28.5 (*)    Platelets 452 (*)    All other components within normal limits  BASIC METABOLIC PANEL - Abnormal; Notable for the following components:   Potassium 3.4 (*)    Glucose, Bld 244 (*)    Creatinine, Ser 1.11 (*)    Calcium 8.0 (*)    GFR calc non Af Amer 56 (*)    All other components within normal limits  LACTIC ACID, PLASMA - Abnormal; Notable for the following components:   Lactic Acid, Venous 2.1 (*)    All other components within normal limits  BETA-HYDROXYBUTYRIC ACID - Abnormal; Notable for the following components:   Beta-Hydroxybutyric Acid 0.29 (*)    All other components within normal limits  CBG MONITORING, ED - Abnormal; Notable for the following components:   Glucose-Capillary 155 (*)    All other components within normal limits  TROPONIN I (HIGH SENSITIVITY) - Abnormal; Notable for the following components:   Troponin I (High Sensitivity) 30 (*)    All other components within normal limits  RESPIRATORY PANEL BY RT PCR (FLU A&B, COVID)  CULTURE, BLOOD (ROUTINE X 2)  CULTURE, BLOOD (ROUTINE X 2)  LIPASE, BLOOD  ETHANOL  RAPID URINE DRUG SCREEN, HOSP PERFORMED  PROCALCITONIN  URINALYSIS, ROUTINE W REFLEX MICROSCOPIC    EKG EKG Interpretation  Date/Time:  Monday Jul 09 2019  22:47:09 EDT Ventricular Rate:  116 PR Interval:    QRS Duration: 81 QT Interval:  346 QTC Calculation: 481 R Axis:   -39 Text Interpretation: Sinus tachycardia Abnormal R-wave progression, late transition Probable left ventricular hypertrophy When compared with ECG of 07/02/2019, HEART RATE has increased Confirmed by Delora Fuel ([id number]) on 07/10/2019 12:49:21 AM   Radiology No results found.  Procedures .Critical Care Performed by: Lorin Glass, PA-C Authorized by: Lorin Glass, PA-C   Critical care provider statement:    Critical care time (minutes):  45   Critical care was time spent personally by me on the following activities:  Discussions with consultants, evaluation of patient's response to treatment, examination of patient, ordering and performing treatments and interventions, ordering and review of laboratory studies, ordering and review of radiographic studies, pulse oximetry, re-evaluation of patient's condition, obtaining history from patient or surrogate and review of old charts   (including critical care time)  Medications Ordered in ED Medications  sodium chloride flush (NS) 0.9 % injection 3 mL (3 mLs Intravenous Given  07/10/19 0500)  ondansetron (ZOFRAN) injection 4 mg (4 mg Intravenous Given 07/10/19 0111)  lactated ringers bolus 1,000 mL (0 mLs Intravenous Stopped 07/10/19 0325)  potassium chloride 10 mEq in 100 mL IVPB (10 mEq Intravenous New Bag/Given 07/10/19 0558)  magnesium sulfate IVPB 2 g 50 mL (0 g Intravenous Stopped 07/10/19 0325)  HYDROmorphone (DILAUDID) injection 0.5 mg (0.5 mg Intravenous Given 07/10/19 0250)  magnesium sulfate IVPB 4 g 100 mL (0 g Intravenous Stopped 07/10/19 6073)    ED Course  I have reviewed the triage vital signs and the nursing notes.  Pertinent labs & imaging results that were available during my care of the patient were reviewed by me and considered in my medical decision making (see chart for details).  Clinical Course  as of Jul 09 656  Tue Jul 10, 2019  0045 IV K ordered.   Potassium(!!): 2.6 [EH]    Clinical Course User Index [EH] Ollen Gross   MDM Rules/Calculators/A&P                     Deborah [name] is a 54 year old woman who presents today for evaluation of chest and abdominal pain.  She reports this is her diabetic gastroparesis.  She has a long standing history of similar.   On initial exam she is generally unwell appearing.  CMP shows mild hypokalemia at 2.6.  Her creatinine is elevated by about 0.4 concerning for AKI.  Her anion gap is minimally elevated at 17.  She is not hyperglycemic today.  Her CO2 is normal.  Ethyl alcohol is undetected.  Magnesium is significantly low at 0.7.  White count shows leukocytosis at 16.2 with hemoglobin of 8.6, these appear to be roughly consistent with her baseline.  She is given IV fluids, IV Dilaudid, IV potassium, magnesium, and Zofran.  She has a longstanding history of gastroparesis and reports that this pain feels similar.  No indication for imaging at this time.  She does not have evidence of DKA.   Based on her multiple electrolyte derangements she will require admission.  I spoke with Dr. Marlowe Sax who will see patient for admission.   This patient was seen as a shared visit with Dr. Roxanne Mins.   Note: Portions of this report may have been transcribed using voice recognition software. Every effort was made to ensure accuracy; however, inadvertent computerized transcription errors may be present  Final Clinical Impression(s) / ED Diagnoses Final diagnoses:  Non-intractable vomiting with nausea, unspecified vomiting type  Hypomagnesemia  Hypokalemia  Diabetic gastroparesis ([place])  AKI (acute kidney injury) [facility])    Rx / DC Orders ED Discharge Orders    None       Ollen Gross 71/06/26 [id number]    Delora Fuel, MD 46/27/03 [id number]    Delora Fuel, MD 38/18/29 [phone]

## 2019-07-10 NOTE — ED Notes (Signed)
Date and time results received: 07/10/19 2:16 AM   Test: lactic  Critical Value: 3.2  Name of Provider [place], [place]

## 2019-07-10 NOTE — ED Notes (Signed)
Patient able to tolerate small amounts of juice, water and crackers w/o complaints of nausea or vomiting.

## 2019-07-10 NOTE — ED Notes (Signed)
Potassium infusion decreased from 151ml/hr to 40ml/hr- pt unable to tolerate infusion

## 2019-07-10 NOTE — ED Notes (Signed)
Date and time results received: 07/10/19 12:40 AM  (use smartphrase ".now" to insert current time)  Test: Potassium Critical Value: 2.6  Name of Provider Notified: Benjamine Mola PA  Orders Received? Or Actions Taken?

## 2019-07-10 NOTE — Progress Notes (Signed)
Inpatient Diabetes Program Recommendations  AACE/ADA: New Consensus Statement on Inpatient Glycemic Control (2015)  Target Ranges:  Prepandial:   less than 140 mg/dL      Peak postprandial:   less than 180 mg/dL (1-2 hours)      Critically ill patients:  140 - 180 mg/dL   Lab Results  Component Value Date   GLUCAP 199 (H) 07/10/2019   HGBA1C 8.4 (H) 07/10/2019    Review of Glycemic Control  Diabetes history: DM2 Outpatient Diabetes medications: Lantus 45 units QHS, Novolog 30 units tidwc Current orders for Inpatient glycemic control: Lantus 20 units QHS, Novolog 0-15 units Q4H  HgbA1C - 8.4%. Doubtful this is accurate with very low H/H.  Inpatient Diabetes Program Recommendations:     Increase Lantus to 25 units QHS Change Novolog to 0-15 units Q4H while NPO.  Will continue to follow.   Thank you. Lorenda Peck, RD, LDN, CDE Inpatient Diabetes Coordinator [phone]

## 2019-07-10 NOTE — H&P (Signed)
History and Physical    Deborah [name]:[id number] DOB: July 03, 1965 DOA: 07/09/2019  PCP: [name], DO Patient coming from: Home  Chief Complaint: Nausea and vomiting  HPI: Deborah [name] is a 54 y.o. female with medical history significant of chronic systolic CHF (EF 40 to 81% on echo done 01/2019), paroxysmal A. fib on Eliquis, insulin-dependent type 2 diabetes with history of recurrent admissions for DKA (most recent 4/23-4/28), CKD stage III, hypertension, hyperlipidemia, CVA, gout, diabetic gastroparesis intolerant to Reglan, gastric ulcer, GERD, anemia of chronic disease, chronic pain, depression, morbid obesity (BMI 54.07) presenting to the ED via EMS with complaints of nausea and vomiting.  Patient states yesterday afternoon she had a ham sandwich and soon after started vomiting.  She also had some substernal sharp chest pain and shortness of breath at that time.  She has continued to have 10 out of 10 intensity generalized abdominal pain since then.  Also continue to have some chest pain since then.  She has continued to vomit and has not been able to tolerate any p.o. intake.  Does report having a normal bowel movement in the afternoon.  She does not recall taking erythromycin since her recent hospital discharge.  States her blood glucose has been in the 200s at home.  ED Course: Afebrile.  Tachycardic and tachypneic on arrival.  Not hypoxic.  WBC count 18.6.  Lactic acid 3.2.  Hemoglobin 7.8, stable compared to labs done during recent hospitalization.  Platelet count 594, previously normal.  Potassium 2.6.  Magnesium level 0.7.  Bicarb 25, anion gap 17.  Blood glucose 119.  BUN 23, creatinine 1.5.  Creatinine was 1.1 at the time of recent hospital discharge.  Lipase and LFTs normal.  UA pending.  Blood ethanol level undetectable.  Blood culture x2 pending.  UDS pending.  SARS-CoV-2 PCR test negative.  Influenza panel negative. No imaging done. Patient received Dilaudid, 1  L LR bolus, IV magnesium 2 g, IV potassium supplementation, and Zofran.  Review of Systems:  All systems reviewed and apart from history of presenting illness, are negative.  Past Medical History:  Diagnosis Date  . Acute back pain with sciatica, left   . Acute back pain with sciatica, right   . AKI (acute kidney injury) ([place])   . Anemia, unspecified   . Chronic pain   . Chronic systolic CHF (congestive heart failure) ([place])   . Diabetes mellitus   . Esophageal reflux   . Fibromyalgia   . Gastric ulcer   . Gastroparesis   . Gout   . Hyperlipidemia   . Hypertension   . Lumbosacral stenosis   . NICM (nonischemic cardiomyopathy) ([place])   . Obesity   . PAF (paroxysmal atrial fibrillation) ([name])   . Stroke ([place]) 02/2011  . Vitamin B12 deficiency anemia     Past Surgical History:  Procedure Laterality Date  . CATARACT EXTRACTION  01/2014  . CHOLECYSTECTOMY       reports that she has never smoked. She has never used smokeless tobacco. She reports that she does not drink alcohol or use drugs.  Allergies  Allergen Reactions  . Contrast Media [Iodinated Diagnostic Agents] Anaphylaxis    Cardiac arrest  . Diazepam [name]  . Isovue [Iopamidol] Anaphylaxis    Patient had seizure like activity and then code post 100 cc of isovue 300  . Lisinopril Anaphylaxis    Tongue and mouth swelling  . Penicillins Palpitations    Has patient had a PCN reaction  causing immediate rash, facial/tongue/throat swelling, SOB or lightheadedness with hypotension: Yes, heart races Has patient had a PCN reaction causing severe rash involving mucus membranes or skin necrosis: No Has patient had a PCN reaction that required hospitalization: Yes  Has patient had a PCN reaction occurring within the last 10 years: No   . Acetaminophen Nausea Only and Other (See Comments)    Irritates stomach ulcer  Abdominal pain  . Tolmetin Nausea Only and Other (See Comments)    ULCER  . Aspirin Other (See  Comments)    Irritates stomach ulcer   . Food Hives and Swelling    Reaction to Carrots, ketchup   . Nsaids Other (See Comments)    ULCER  . Tramadol Nausea And Vomiting    Family History  Problem Relation Age of Onset  . Diabetes Mother   . Diabetes Father   . Heart disease Father   . Diabetes Sister   . Congestive Heart Failure Sister 16  . Diabetes Brother     Prior to Admission medications   Medication Sig Start Date End Date Taking? Authorizing Provider  albuterol (PROVENTIL) (2.5 MG/3ML) 0.083% nebulizer solution Take 3 mLs (2.5 mg total) by nebulization every 6 (six) hours as needed for wheezing or shortness of breath. 04/06/19   Scot Jun, FNP  apixaban (ELIQUIS) 5 MG TABS tablet Take 1 tablet (5 mg total) by mouth 2 (two) times daily. 05/09/19   Gifford Shave, MD  atorvastatin (LIPITOR) 10 MG tablet Take 1 tablet (10 mg total) by mouth daily. 12/13/18   [name], DO  blood glucose meter kit and supplies KIT Dispense based on patient and insurance preference. Use up to four times daily as directed. (FOR ICD-9 250.00, 250.01). 12/13/18   [name], DO  cetirizine (ZYRTEC) 10 MG tablet Take 1 tablet (10 mg total) by mouth daily. 12/13/18   [name], DO  dicyclomine (BENTYL) 20 MG tablet TAKE 1 TABLET (20 MG TOTAL) BY MOUTH 3 (THREE) TIMES DAILY AS NEEDED FOR SPASMS (ABDOMINAL CRAMPING). Patient not taking: Reported on 06/29/2019 06/22/19 07/22/19  [name], DO  DULoxetine HCl 40 MG CPEP TAKE ONE CAPSULE BY MOUTH TWICE DAILY Patient taking differently: Take 40 mg by mouth in the morning and at bedtime.  06/04/19   [name], DO  erythromycin (E-MYCIN) 250 MG tablet Take 1 tablet (250 mg total) by mouth 4 (four) times daily -  with meals and at bedtime. 07/04/19   Eugenie Filler, MD  fluticasone [facility]) 50 MCG/ACT nasal spray Place 2 sprays into both nostrils daily as needed for allergies or rhinitis. 12/19/18   Rai, Ripudeep K, MD    hydrALAZINE (APRESOLINE) 25 MG tablet TAKE ONE TABLET BY MOUTH THREE TIMES A DAY. FOR BLOOD PRESSURE Patient taking differently: Take 25 mg by mouth 3 (three) times daily. For blood pressure 04/19/19   Lockamy, Timothy, DO  insulin aspart (NOVOLOG FLEXPEN) 100 UNIT/ML FlexPen Inject 15 Units into the skin 3 (three) times daily with meals. Patient taking differently: Inject 30 Units into the skin 3 (three) times daily with meals.  06/26/19   Mariel Aloe, MD  isosorbide mononitrate (IMDUR) 60 MG 24 hr tablet Take 1 tablet (60 mg total) by mouth daily. 07/05/19   Eugenie Filler, MD  LANTUS SOLOSTAR 100 UNIT/ML Solostar Pen Inject 45 Units into the skin at bedtime. 06/14/19   Pokhrel, Corrie Mckusick, MD  loperamide (IMODIUM) 2 MG capsule Take 2 mg by mouth 4 (four) times daily  as needed for diarrhea or loose stools.    [provider]  magnesium oxide (MAG-OX) 400 (241.3 Mg) MG tablet Take 1 tablet (400 mg total) by mouth 2 (two) times daily. 06/14/19   Pokhrel, Corrie Mckusick, MD  Melatonin 3 MG TABS Take 2 tablets (6 mg total) by mouth at bedtime. 12/13/18   [name], DO  metoprolol succinate (TOPROL-XL) 25 MG 24 hr tablet Take 25 mg by mouth daily. 06/01/19   [provider]  nitroGLYCERIN (NITROSTAT) 0.4 MG SL tablet Place 1 tablet (0.4 mg total) under the tongue every 5 (five) minutes as needed for chest pain. 12/13/18   Lockamy, Christia Reading, DO  ondansetron (ZOFRAN-ODT) 4 MG disintegrating tablet Take 4 mg by mouth every 8 (eight) hours as needed for nausea or vomiting.  06/07/19   [provider]  pantoprazole (PROTONIX) 40 MG tablet Take 1 tablet (40 mg total) by mouth 2 (two) times daily. 07/04/19 08/03/19  Eugenie Filler, MD  torsemide (DEMADEX) 20 MG tablet Take 2 tablets (40 mg total) by mouth daily. Hold if having significant vomiting 07/06/19   Eugenie Filler, MD    Physical Exam: Vitals:   07/09/19 2242 07/10/19 0124 07/10/19 0130 07/10/19 0236  BP: (!) 164/106 (!)  164/106 (!) 168/123 137/63  Pulse: (!) 121 (!) 115 (!) 109 (!) 111  Resp: (!) 21 (!) _0 Temp: 98.1 F (36.7 C)     TempSrc: Oral     SpO2: 100% 100% 100% 99%  Weight:      Height:        Physical Exam  Constitutional: She is oriented to person, place, and time. She appears well-developed and well-nourished. No distress.  HENT:  Head: Normocephalic.  Dry mucous membranes  Eyes: Right eye exhibits no discharge. Left eye exhibits no discharge.  Cardiovascular: Normal rate, regular rhythm and intact distal pulses.  Pulmonary/Chest: Effort normal and breath sounds normal. No respiratory distress. She has no wheezes. She has no rales.  Abdominal: Soft. Bowel sounds are normal. She exhibits no distension. There is abdominal tenderness. There is no guarding.  Generalized tenderness to palpation  Musculoskeletal:        General: No tenderness.     Cervical back: Neck supple.  Neurological: She is alert and oriented to person, place, and time.  Skin: Skin is warm and dry. She is not diaphoretic.    Labs on Admission: I have personally reviewed following labs and imaging studies  CBC: Recent Labs  Lab 07/03/19 1057 07/04/19 0857 07/09/19 2249  WBC 11.1* 11.6* 18.6*  HGB 7.9* 7.7* 7.8*  HCT 27.1* 25.4* 25.5*  MCV 90.0 88.8 85.9  PLT 316 330 993*   Basic Metabolic Panel: Recent Labs  Lab 07/03/19 0930 07/04/19 0857 07/09/19 2249  NA 135 136 141  K 3.7 3.4* 2.6*  CL 102 103 99  CO2 _1 GLUCOSE 158* 152* 119*  BUN 22* 18 23*  CREATININE 1.41* 1.11* 1.52*  CALCIUM 8.4* 8.7* 8.9  MG  --  1.6* 0.7*   GFR: Estimated Creatinine Clearance: 63.6 mL/min (A) (by C-G formula based on SCr of 1.52 mg/dL (H)). Liver Function Tests: Recent Labs  Lab 07/09/19 2249  AST 21  ALT 14  ALKPHOS 101  BILITOT 1.0  PROT 8.3*  ALBUMIN 3.6   Recent Labs  Lab 07/09/19 2249  LIPASE 24   No results for input(s): AMMONIA in the last 168 hours. Coagulation Profile: No  results for input(s):  INR, PROTIME in the last 168 hours. Cardiac Enzymes: No results for input(s): CKTOTAL, CKMB, CKMBINDEX, TROPONINI in the last 168 hours. BNP (last 3 results) No results for input(s): PROBNP in the last 8760 hours. HbA1C: No results for input(s): HGBA1C in the last 72 hours. CBG: Recent Labs  Lab 07/03/19 1711 07/03/19 2205 07/04/19 0747 07/04/19 1152 07/10/19 0115  GLUCAP 248* 257* 153* 114* 155*   Lipid Profile: No results for input(s): CHOL, HDL, LDLCALC, TRIG, CHOLHDL, LDLDIRECT in the last 72 hours. Thyroid Function Tests: No results for input(s): TSH, T4TOTAL, FREET4, T3FREE, THYROIDAB in the last 72 hours. Anemia Panel: No results for input(s): VITAMINB12, FOLATE, FERRITIN, TIBC, IRON, RETICCTPCT in the last 72 hours. Urine analysis:    Component Value Date/Time   COLORURINE STRAW (A) 06/29/2019 1313   APPEARANCEUR CLEAR 06/29/2019 1313   LABSPEC 1.007 06/29/2019 1313   PHURINE 5.0 06/29/2019 1313   GLUCOSEU >=500 (A) 06/29/2019 1313   HGBUR SMALL (A) 06/29/2019 1313   BILIRUBINUR NEGATIVE 06/29/2019 1313   KETONESUR NEGATIVE 06/29/2019 1313   PROTEINUR 100 (A) 06/29/2019 1313   UROBILINOGEN 0.2 10/02/2013 2108   NITRITE NEGATIVE 06/29/2019 1313   LEUKOCYTESUR NEGATIVE 06/29/2019 1313    Radiological Exams on Admission: No results found.  EKG: Independently reviewed.  Sinus tachycardia, no significant change since prior tracing. QTc 481.  Assessment/Plan Principal Problem:   Intractable nausea and vomiting Active Problems:   Hypokalemia   Leukocytosis   Abdominal pain   Lactic acidosis   Abdominal pain, intractable nausea and vomiting:  Suspect related to diabetic gastroparesis versus underlying infection.  Afebrile, however, does have leukocytosis and lactic acidosis on labs.  Lipase and LFTs normal.  Complaining of 10 out of 10 intensity abdominal pain and has generalized tenderness to palpation on exam.  Work-up does not appear to  be suggestive of DKA at this time.  SARS-CoV-2 PCR test negative. -Order stat CT abdomen pelvis without contrast.  Continue gentle IV fluid hydration.  Resume erythromycin to 50 mg 3 times a day with meals and at bedtime.  Patient has a history of intolerance to Reglan.  Antiemetic as needed.  Continue Dilaudid as needed for severe pain.  Chest pain: Appears atypical.  EKG without acute ischemic changes.  Check high-sensitivity troponin level and continue cardiac monitoring.  Leukocytosis: Could be reactive versus due to underlying infection.  Patient is afebrile.  Does have abdominal tenderness on exam.  Lungs clear on exam. -CT abdomen pelvis as above to assess for infectious source.  UA pending.  Repeat CBC in a.m. Check procalcitonin level.  Blood culture x2 pending.  Lactic acidosis: Dehydration versus ?underlying infection/ sepsis. Blood ethanol level undetectable.   -CT abdomen pelvis as above.  Patient received fluid bolus in the ED.  Repeat lactic acid level.    Hypokalemia: Likely related to decreased p.o. intake from intractable nausea/vomiting and home diuretic use.  Severe hypomagnesemia also likely contributing.  EKG without acute changes.  Continue to replete potassium and magnesium.  Monitor electrolytes closely.  Hold diuretic.  Continue cardiac monitoring.  Severe hypomagnesemia: Continue to replete and monitor closely.  Mild thrombocytosis: Repeat CBC in a.m. to confirm  AKI on CKD stage III: BUN 23, creatinine 1.5, creatinine was 1.1 at the time of recent hospital discharge.  Suspect prerenal from dehydration and home diuretic use.  Continue IV fluid hydration and monitor renal function closely.  Hold diuretic and avoid nephrotoxic agents.  Poorly controlled insulin-dependent type 2 diabetes: A1c 8.3 on  05/07/2019.  Work-up does not appear to be suggestive of DKA at this time.  Anion gap mildly elevated at 17.  However, bicarb normal at 25 and blood glucose 119.  Order sliding  scale insulin with meals and continue home Lantus at a lower dose given decreased p.o. intake/intractable nausea and vomiting.  Check beta hydroxybutyrate level.  Chronic systolic CHF: EF 40 to 57% on echo done 01/2019).  No signs of volume overload at this time.  Hold diuretic.  Paroxysmal A. fib: Currently in sinus rhythm.  Continue home metoprolol.  Hold Eliquis at this time as CT abdomen pelvis pending.  GERD: Continue PPI  DVT prophylaxis: On Eliquis at home.  Resume Eliquis if CT abdomen pelvis is not suggestive of any pathology that requires surgical intervention. Code Status: Full code Family Communication: No family available at this time. Disposition Plan: Status is: Inpatient  Remains inpatient appropriate because:IV treatments appropriate due to intensity of illness or inability to take PO   Dispo: The patient is from: Home              Anticipated d/c is to: Home              Anticipated d/c date is: 2 days              Patient currently is not medically stable to d/c.  The medical decision making on this patient was of high complexity and the patient is at high risk for clinical deterioration, therefore this is a level 3 visit.  Shela Leff MD [facility]  If 7PM-7AM, please contact night-coverage [email or web address]  07/10/2019, 4:19 AM

## 2019-07-11 ENCOUNTER — Ambulatory Visit: Payer: Self-pay | Admitting: Licensed Clinical Social Worker

## 2019-07-11 ENCOUNTER — Telehealth: Payer: Medicare Other

## 2019-07-11 DIAGNOSIS — D72829 Elevated white blood cell count, unspecified: Secondary | ICD-10-CM

## 2019-07-11 DIAGNOSIS — E872 Acidosis: Secondary | ICD-10-CM

## 2019-07-11 LAB — COMPREHENSIVE METABOLIC PANEL
ALT: 10 U/L (ref 0–44)
AST: 12 U/L — ABNORMAL LOW (ref 15–41)
Albumin: 2.7 g/dL — ABNORMAL LOW (ref 3.5–5.0)
Alkaline Phosphatase: 78 U/L (ref 38–126)
Anion gap: 10 (ref 5–15)
BUN: 21 mg/dL — ABNORMAL HIGH (ref 6–20)
CO2: 28 mmol/L (ref 22–32)
Calcium: 7.7 mg/dL — ABNORMAL LOW (ref 8.9–10.3)
Chloride: 102 mmol/L (ref 98–111)
Creatinine, Ser: 1.31 mg/dL — ABNORMAL HIGH (ref 0.44–1.00)
GFR calc Af Amer: 53 mL/min — ABNORMAL LOW (ref >60)
GFR calc non Af Amer: 46 mL/min — ABNORMAL LOW (ref >60)
Glucose, Bld: 184 mg/dL — ABNORMAL HIGH (ref 70–99)
Potassium: 3.3 mmol/L — ABNORMAL LOW (ref 3.5–5.1)
Sodium: 140 mmol/L (ref 135–145)
Total Bilirubin: 0.5 mg/dL (ref 0.3–1.2)
Total Protein: 6.2 g/dL — ABNORMAL LOW (ref 6.5–8.1)

## 2019-07-11 LAB — TROPONIN I (HIGH SENSITIVITY): Troponin I (High Sensitivity): 23 ng/L — ABNORMAL HIGH (ref <18)

## 2019-07-11 LAB — CBC
HCT: 24.9 % — ABNORMAL LOW (ref 36.0–46.0)
Hemoglobin: 7.3 g/dL — ABNORMAL LOW (ref 12.0–15.0)
MCH: 26.4 pg (ref 26.0–34.0)
MCHC: 29.3 g/dL — ABNORMAL LOW (ref 30.0–36.0)
MCV: 90.2 fL (ref 80.0–100.0)
Platelets: 382 10*3/uL (ref 150–400)
RBC: 2.76 MIL/uL — ABNORMAL LOW (ref 3.87–5.11)
RDW: 15.1 % (ref 11.5–15.5)
WBC: 12.7 10*3/uL — ABNORMAL HIGH (ref 4.0–10.5)
nRBC: 0 % (ref 0.0–0.2)

## 2019-07-11 LAB — LACTIC ACID, PLASMA: Lactic Acid, Venous: 1.2 mmol/L (ref 0.5–1.9)

## 2019-07-11 LAB — GLUCOSE, CAPILLARY
Glucose-Capillary: 197 mg/dL — ABNORMAL HIGH (ref 70–99)
Glucose-Capillary: 159 mg/dL — ABNORMAL HIGH (ref 70–99)
Glucose-Capillary: 271 mg/dL — ABNORMAL HIGH (ref 70–99)
Glucose-Capillary: 281 mg/dL — ABNORMAL HIGH (ref 70–99)

## 2019-07-11 LAB — MAGNESIUM: Magnesium: 2.1 mg/dL (ref 1.7–2.4)

## 2019-07-11 MED ORDER — POTASSIUM CHLORIDE 10 MEQ/100ML IV SOLN
10.0000 meq | INTRAVENOUS | Status: AC
  Administered 2019-07-11 (×4): 10 meq via INTRAVENOUS
  Filled 2019-07-11 (×4): qty 100

## 2019-07-11 NOTE — Progress Notes (Signed)
PROGRESS NOTE    Deborah [name]  HDQ:[id number] DOB: 31-Jan-1966 DOA: 07/09/2019 PCP: [name], DO  Brief Narrative:    Assessment & Plan:   Principal Problem:   Intractable nausea and vomiting Active Problems:   Hypokalemia   Leukocytosis   Abdominal pain   Lactic acidosis  Abdominal pain, intractable nausea and vomiting in the setting of Diabetic Gastroparesis  -Patient presenting with acute onset nausea/vomiting and abdominal pain.  Suspicion for diabetic gastroparesis.   -Patient is afebrile, but had elevated WBC count of 16.2 and a lactic acid of 3.2 on admission.  -Lipase and LFTs within normal limits.   -CT Abd/Pelvis showed "No acute finding including bowel obstruction or visible inflammation.  Chronic findings are stable from CT 1 month ago and described above." -Previous gastric emptying study 2018 that was unrevealing -N.p.o initially but now on a CLD and will attempt Soft Diet  -NS at 75 mL's per hour now stopped  -Started on Erythromycin 50 mg p.o. 3 times daily with meals and nightly when diet advanced -C/w Supportive care, Antiemetics with Promethazine 25 mg IV q6hprn Nausea and Vomiting  -C/w PPI with Pantoprazole 40 mg po BID  -Judicious use of Narcotic Medications; Currently has Hydromorphone 1 mg IV q4hprn Severe Pain  -Consulted gastroenterology for further evaluation and recommendations  Atypical Chest Pain -Improved  -EKG without acute ischemic changes.  High-sensitivity troponin 30 and trended down to 23 -Continue to trend troponin -Replete Electrolytes -Continue to monitor on telemetry  Leukocytosis: -Etiology likely reactive from dehydration.   -Unlikely infectious etiology with normal procalcitonin.   -No acute findings on CT abdomen/pelvis.  -Urinalysis Still pending but UDS was Negative  -Blood cultures x2: NGTD at <24 Hours  -Continue to monitor CBC daily  Lactic Acidosis -Likely secondary to dehydration.   -Unlikely  infectious process.   -Procalcitonin less than 0.10. -Blood ethanol level undetectable.    -CT abdomen/pelvis negative. -Lactic Acid 3.2--> 2.1 --> 1.2 -IV fluid hydration is now stopped as she is improved and lactic acid level is now normalized  Hypokalemia -Likely related to decreased p.o. intake from intractable nausea/vomiting and home diuretic use.  -Severe hypomagnesemia also likely contributing (0.7) -K+ this AM was 3.3 -Replete with IV KCl 40 mEQ  -EKG without acute changes.  -Continue to hold home Torsemide -Continue to monitor electrolytes daily  Severe Hypomagnesemia -Improved.  Patient's magnesium went from 0.7 is now 2.1 -Continue to monitor and replete as necessary -Repeat magnesium level in the a.m.  AKI on CKD stage III:  -BUN 23, creatinine 1.5, creatinine was 1.1 at the time of recent hospital discharge.  -Suspect prerenal from dehydration and home diuretic use.  -Continue holding home diuretic with torsemide -Continued NS at 75 mL's per hour but this is now stopped -Patient's BUN/creatinine today is slightly worsened from yesterday and is gone from 20/1.11 is now 21/1.31 -Avoid nephrotoxic medications, contrast dyes, hypotension and renally adjust medications -Repeat CMP in a.m.  Poorly controlled Insulin-Dependent Type 2 Diabetes  -Hemoglobin A1c A1c 8.4.   -Home regimen includes Lantus 45 units subcutaneously daily, NovoLog 15 subcutaneously 3 times daily AC. -Diabetic educator following, appreciate assistance -Restart Lantus at a lower dose, 25 units of insulin daily -C/w Insulin sliding scale for coverage with moderate NovoLog/scale AC -CBG's ranging from 119-417  Chronic Systolic CHF -EF 40 to 40% on TTE 01/2019. -No signs of volume overload at this time. -Currently holding home torsemide and IV fluid hydration is now stopped -Continue Metoprolol Succinate  25 mg p.o. daily -Continue to monitor volume status carefully and continue to  monitor for signs and symptoms of volume overload -Strict I's and O's and daily weights  Paroxysmal Atrial Fibrilliation -Currently in sinus rhythm. Continue home Metoprolol XL 25 mg po Daily and Apixaban 5 mg po BID  GERD -Continue PPI with Pantoprazole 40 mg po BID  Anemia of chronic disease -Patient's hemoglobin/hematocrit went from 8.6/20.5 is now 7.3/24.9 -Anemia panel done on 06/26/2019 showed an iron level of 18, U IBC of 327, TIBC of 345, saturation ratios of 5%, ferritin level 48, folate level of 6.6, and vitamin B12 level of 251 -Continue to monitor for signs and symptoms of bleeding as she is anticoagulated with Apixaban; currently no overt bleeding noted -Repeat CBC in a.m.  Depression -Remained stable.  Patient maintained on home regimen of antidepressant medication with Duloxetine 40 mg po BID  Super Morbid Obesity -Estimated body mass index is 54.07 kg/m as calculated from the following:   Height as of this encounter: 5\' 6"  (1.676 m).   Weight as of this encounter: 152 kg. -Weight Loss and Dietary Counseling given   DVT prophylaxis: Anticoagulated with Apixaban 5 mg po BID  Code Status: FULL CODE  Family Communication: No family present at bedside  Disposition Plan: Patient is from home likely to be discharged home when she is able to tolerate a diet without abdominal pain or nausea.  Gastroenterology has been consulted given her suspected diabetic gastroparesis  Status is: Inpatient  Remains inpatient appropriate because:Persistent severe electrolyte disturbances, Ongoing active pain requiring inpatient pain management and Inpatient level of care appropriate due to severity of illness   Dispo: The patient is from: Home              Anticipated d/c is to: Home              Anticipated d/c date is: 1 day              Patient currently is not medically stable to d/c.   Consultants:   Gastroenterology    Procedures: None   Antimicrobials:    Anti-infectives (From admission, onward)   Start     Dose/Rate Route Frequency Ordered Stop   07/10/19 0800  erythromycin (E-MYCIN) tablet 250 mg     250 mg Oral 3 times daily with meals & bedtime 07/10/19 0342       Subjective: Seen and examined at bedside and states that she was "not doing well".  Stated that she vomited earlier and still is nauseous.  Continues to have abdominal pain.  No chest pain, lightheadedness or dizziness.  States that she was able to keep down some crackers to "lie in her stomach" but felt queasy.  States that she did not want to continue the clinical diet and wanted to attempt to go to a soft diet and understands that she will be cut back if she continues to have significant nausea vomiting.  Objective: Vitals:   07/10/19 1624 07/10/19 2117 07/11/19 0211 07/11/19 0617  BP: (!) 124/94 129/76 (!) 106/55 (!) 108/56  Pulse: 94 91 91 91  Resp: 20 (!) 22 20 20   Temp: 97.9 F (36.6 C) (!) 97.4 F (36.3 C) (!) 97.4 F (36.3 C) 97.9 F (36.6 C)  TempSrc: Oral  Oral   SpO2: 99% 99% 97% 98%  Weight:      Height:        Intake/Output Summary (Last 24 hours) at 07/11/2019 0744 Last data  filed at 07/10/2019 1553 Gross per 24 hour  Intake 916.25 ml  Output --  Net 916.25 ml   Filed Weights   07/09/19 2240  Weight: (!) 152 kg   Examination: Physical Exam:  Constitutional: WN/WD super morbidly obese African-American female currently in NAD and appears calm but does appear somewhat uncomfortable Eyes: Lids and conjunctivae normal, sclerae anicteric  ENMT: External Ears, Nose appear normal. Grossly normal hearing.  Neck: Appears normal, supple, no cervical masses, normal ROM, no appreciable thyromegaly: No JVD Respiratory: Diminished to auscultation bilaterally, no wheezing, rales, rhonchi or crackles. Normal respiratory effort and patient is not tachypenic. No accessory muscle use.  Unlabored breathing Cardiovascular: RRR, no murmurs / rubs / gallops. S1 and S2  auscultated.  Trace extremity edema Abdomen: Soft, tender to palpate, distended secondary to body habitus. Bowel sounds positive.  GU: Deferred. Musculoskeletal: No clubbing / cyanosis of digits/nails. No joint deformity upper and lower extremities.  Skin: No rashes, lesions, ulcers on limited skin evaluation. No induration; Warm and dry.  Neurologic: CN 2-12 grossly intact with no focal deficits. Romberg sign and cerebellar reflexes not assessed.  Psychiatric: Normal judgment and insight. Alert and oriented x 3. Normal mood and appropriate affect.   Data Reviewed: I have personally reviewed following labs and imaging studies  CBC: Recent Labs  Lab 07/04/19 0857 07/09/19 2249 07/10/19 0454 07/11/19 0459  WBC 11.6* 18.6* 16.2* 12.7*  HGB 7.7* 7.8* 8.6* 7.3*  HCT 25.4* 25.5* 28.5* 24.9*  MCV 88.8 85.9 89.1 90.2  PLT 330 594* 452* 382   Basic Metabolic Panel: Recent Labs  Lab 07/04/19 0857 07/09/19 2249 07/10/19 0454 07/11/19 0459  NA 136 141 139 140  K 3.4* 2.6* 3.4* 3.3*  CL 103 99 101 102  CO2 26 25 24 28   GLUCOSE 152* 119* 244* 184*  BUN 18 23* 20 21*  CREATININE 1.11* 1.52* 1.11* 1.31*  CALCIUM 8.7* 8.9 8.0* 7.7*  MG 1.6* 0.7*  --  2.1   GFR: Estimated Creatinine Clearance: 73.8 mL/min (A) (by C-G formula based on SCr of 1.31 mg/dL (H)). Liver Function Tests: Recent Labs  Lab 07/09/19 2249 07/11/19 0459  AST 21 12*  ALT 14 10  ALKPHOS 101 78  BILITOT 1.0 0.5  PROT 8.3* 6.2*  ALBUMIN 3.6 2.7*   Recent Labs  Lab 07/09/19 2249  LIPASE 24   No results for input(s): AMMONIA in the last 168 hours. Coagulation Profile: No results for input(s): INR, PROTIME in the last 168 hours. Cardiac Enzymes: No results for input(s): CKTOTAL, CKMB, CKMBINDEX, TROPONINI in the last 168 hours. BNP (last 3 results) No results for input(s): PROBNP in the last 8760 hours. HbA1C: Recent Labs    07/10/19 0454  HGBA1C 8.4*   CBG: Recent Labs  Lab 07/10/19 0115  07/10/19 0736 07/10/19 1154 07/10/19 1659 07/10/19 2120  GLUCAP 155* 213* 199* 231* 193*   Lipid Profile: No results for input(s): CHOL, HDL, LDLCALC, TRIG, CHOLHDL, LDLDIRECT in the last 72 hours. Thyroid Function Tests: No results for input(s): TSH, T4TOTAL, FREET4, T3FREE, THYROIDAB in the last 72 hours. Anemia Panel: No results for input(s): VITAMINB12, FOLATE, FERRITIN, TIBC, IRON, RETICCTPCT in the last 72 hours. Sepsis Labs: Recent Labs  Lab 07/10/19 0119 07/10/19 0454 07/11/19 0459  PROCALCITON  --  <0.10  --   LATICACIDVEN 3.2* 2.1* 1.2    Recent Results (from the past 240 hour(s))  Respiratory Panel by RT PCR (Flu A&B, Covid) - Nasopharyngeal Swab     Status:  None   Collection Time: 07/10/19  1:19 AM   Specimen: Nasopharyngeal Swab  Result Value Ref Range Status   SARS Coronavirus 2 by RT PCR NEGATIVE NEGATIVE Final    Comment: (NOTE) SARS-CoV-2 target nucleic acids are NOT DETECTED. The SARS-CoV-2 RNA is generally detectable in upper respiratoy specimens during the acute phase of infection. The lowest concentration of SARS-CoV-2 viral copies this assay can detect is 131 copies/mL. A negative result does not preclude SARS-Cov-2 infection and should not be used as the sole basis for treatment or other patient management decisions. A negative result may occur with  improper specimen collection/handling, submission of specimen other than nasopharyngeal swab, presence of viral mutation(s) within the areas targeted by this assay, and inadequate number of viral copies (<131 copies/mL). A negative result must be combined with clinical observations, patient history, and epidemiological information. The expected result is Negative. Fact Sheet for Patients:  PinkCheek.be Fact Sheet for Healthcare Providers:  GravelBags.it This test is not yet ap proved or cleared by the [place] FDA and  has been authorized  for detection and/or diagnosis of SARS-CoV-2 by FDA under an Emergency Use Authorization (EUA). This EUA will remain  in effect (meaning this test can be used) for the duration of the COVID-19 declaration under Section 564(b)(1) of the Act, 21 U.S.C. section 360bbb-3(b)(1), unless the authorization is terminated or revoked sooner.    Influenza A by PCR NEGATIVE NEGATIVE Final   Influenza B by PCR NEGATIVE NEGATIVE Final    Comment: (NOTE) The Xpert Xpress SARS-CoV-2/FLU/RSV assay is intended as an aid in  the diagnosis of influenza from Nasopharyngeal swab specimens and  should not be used as a sole basis for treatment. Nasal washings and  aspirates are unacceptable for Xpert Xpress SARS-CoV-2/FLU/RSV  testing. Fact Sheet for Patients: PinkCheek.be Fact Sheet for Healthcare Providers: GravelBags.it This test is not yet approved or cleared by the [place] FDA and  has been authorized for detection and/or diagnosis of SARS-CoV-2 by  FDA under an Emergency Use Authorization (EUA). This EUA will remain  in effect (meaning this test can be used) for the duration of the  Covid-19 declaration under Section 564(b)(1) of the Act, 21  U.S.C. section 360bbb-3(b)(1), unless the authorization is  terminated or revoked. Performed at [facility], [place] [address] [place], [place] [address]   Culture, blood (routine x 2)     Status: None (Preliminary result)   Collection Time: 07/10/19  1:19 AM   Specimen: BLOOD  Result Value Ref Range Status   Specimen Description   Final    BLOOD BLOOD RIGHT FOREARM Performed at [place] [address] [place], [place] [address]    Special Requests   Final    BOTTLES DRAWN AEROBIC AND ANAEROBIC Blood Culture adequate volume Performed at [place] [address] [place], [place] [address]    Culture   Final    NO GROWTH 1  DAY Performed at [facility], [place] [address] [place], [place] [address]    Report Status PENDING  Incomplete  Culture, blood (routine x 2)     Status: None (Preliminary result)   Collection Time: 07/10/19  4:54 AM   Specimen: BLOOD  Result Value Ref Range Status   Specimen Description   Final    BLOOD BLOOD LEFT FOREARM Performed at [place] [address], [place], [place] [address]    Special Requests   Final  BOTTLES DRAWN AEROBIC AND ANAEROBIC Blood Culture adequate volume Performed at [facility] [address], [place], [place] [address]    Culture   Final    NO GROWTH < 24 HOURS Performed at [facility] [address], [place], [place] [address]    Report Status PENDING  Incomplete    RN Pressure Injury Documentation:     Estimated body mass index is 54.07 kg/m as calculated from the following:   Height as of this encounter: 5\' 6"  (1.676 m).   Weight as of this encounter: 152 kg.  Malnutrition Type:      Malnutrition Characteristics:      Nutrition Interventions:    Radiology Studies: CT ABDOMEN PELVIS WO CONTRAST  Result Date: 07/10/2019 CLINICAL DATA:  Nausea and vomiting for 2 hours EXAM: CT ABDOMEN AND PELVIS WITHOUT CONTRAST TECHNIQUE: Multidetector CT imaging of the abdomen and pelvis was performed following the standard protocol without IV contrast. COMPARISON:  06/10/2019 FINDINGS: Lower chest:  No contributory findings. Hepatobiliary: No focal liver abnormality.Cholecystectomy without bile duct dilatation. Pancreas: Unremarkable. Spleen: Unremarkable. Adrenals/Urinary Tract: Negative adrenals. No hydronephrosis or stone. Unremarkable bladder. Stomach/Bowel:  No obstruction. No appendicitis. Vascular/Lymphatic: No acute vascular abnormality. No mass or adenopathy. Reproductive:5 cm left adnexal mass is an exophytic fibroid by 2019 pelvic MRI. Pelvic floor laxity. Other: No ascites or  pneumoperitoneum. Musculoskeletal: No acute abnormalities. Spondylosis and advanced lower lumbar facet osteoarthritis. Biforaminal impingement at L4-5 where there is jild anterolisthesis. IMPRESSION: 1. No acute finding including bowel obstruction or visible inflammation. 2. Chronic findings are stable from CT 1 month ago and described above. Electronically Signed   By: Monte Fantasia M.D.   On: 07/10/2019 04:55   Scheduled Meds: . apixaban  5 mg Oral BID  . atorvastatin  10 mg Oral Daily  . DULoxetine  40 mg Oral BID  . erythromycin  250 mg Oral TID WC & HS  . insulin aspart  0-15 Units Subcutaneous TID WC  . insulin glargine  25 Units Subcutaneous QHS  . metoprolol succinate  25 mg Oral Daily  . pantoprazole  40 mg Oral BID   Continuous Infusions: . potassium chloride      LOS: 1 day   Kerney Elbe, DO [facility] PAGER is on [place]  If 7PM-7AM, please contact night-coverage [email or web address]

## 2019-07-11 NOTE — Consult Note (Signed)
[facility] Consult  Referring Provider: Kerney Elbe, DO Primary Care Physician:  [name], DO Primary Gastroenterologist: Althia Forts  Reason for Consultation: Nausea and vomiting from underlying diabetic gastroparesis  HPI: Deborah [name] is a 54 y.o. female was admitted on 07/09/2019 with multiple episodes of vomiting.  Patient states she had at least 8 episodes of vomiting, each episode contained clear yellow fluid, small in amount, without blood in it or black material in it.  Before starting with nausea and vomiting she had generalized abdominal pain and later developed epigastric, upper abdominal and chest pain.  Patient states she was diagnosed with diabetic gastroparesis in 2009, states she has had a gastric emptying scan in the past and was on Reglan 10 mg before each meal, 3-4 times a day until it was recently stopped.  She was told about the side effects of Reglan including tremors and tardive dyskinesia. Patient states she has intermittent episodes of intractable nausea and vomiting, usually related with fluctuation of blood sugars. Majority of the times it gets better after a few days. More recently since stopping Reglan the frequency of nausea and vomiting have increased.  Patient states she had a colonoscopy last year at [facility], for screening which was reported as normal. She states she recently had an endoscopy however am not able to find the reports on epic or care everywhere.  Currently she does not have any nausea, vomiting or abdominal pain.  Last episode of vomiting was yesterday night.  She has been able to tolerate water and dry crackers today.  She denies use of marijuana. She does not recall being on erythromycin as a prokinetic medication and states that Phenergan helps with nausea.  Past Medical History:  Diagnosis Date  . Acute back pain with sciatica, left   . Acute back pain with sciatica, right   . AKI (acute kidney injury)  ([place])   . Anemia, unspecified   . Chronic pain   . Chronic systolic CHF (congestive heart failure) ([place])   . Diabetes mellitus   . Esophageal reflux   . Fibromyalgia   . Gastric ulcer   . Gastroparesis   . Gout   . Hyperlipidemia   . Hypertension   . Lumbosacral stenosis   . NICM (nonischemic cardiomyopathy) ([place])   . Obesity   . PAF (paroxysmal atrial fibrillation) ([place])   . Stroke ([place]) 02/2011  . Vitamin B12 deficiency anemia     Past Surgical History:  Procedure Laterality Date  . CATARACT EXTRACTION  01/2014  . CHOLECYSTECTOMY      Prior to Admission medications   Medication Sig Start Date End Date Taking? Authorizing Provider  albuterol (PROVENTIL) (2.5 MG/3ML) 0.083% nebulizer solution Take 3 mLs (2.5 mg total) by nebulization every 6 (six) hours as needed for wheezing or shortness of breath. 04/06/19  Yes Scot Jun, FNP  apixaban (ELIQUIS) 5 MG TABS tablet Take 1 tablet (5 mg total) by mouth 2 (two) times daily. 05/09/19  Yes Gifford Shave, MD  atorvastatin (LIPITOR) 10 MG tablet Take 1 tablet (10 mg total) by mouth daily. 12/13/18  Yes Lockamy, Christia [place], DO  cetirizine (ZYRTEC) 10 MG tablet Take 1 tablet (10 mg total) by mouth daily. 12/13/18  Yes Lockamy, Timothy, DO  DULoxetine HCl 40 MG CPEP TAKE ONE CAPSULE BY MOUTH TWICE DAILY Patient taking differently: Take 40 mg by mouth in the morning and at bedtime.  06/04/19  Yes [name], DO  erythromycin (E-MYCIN) 250 MG tablet Take 1  tablet (250 mg total) by mouth 4 (four) times daily -  with meals and at bedtime. 07/04/19  Yes Eugenie Filler, MD  fluticasone [facility]) 50 MCG/ACT nasal spray Place 2 sprays into both nostrils daily as needed for allergies or rhinitis. 12/19/18  Yes Rai, Ripudeep K, MD  hydrALAZINE (APRESOLINE) 25 MG tablet TAKE ONE TABLET BY MOUTH THREE TIMES A DAY. FOR BLOOD PRESSURE Patient taking differently: Take 25 mg by mouth 3 (three) times daily. For blood pressure 04/19/19  Yes  Lockamy, Timothy, DO  insulin aspart (NOVOLOG FLEXPEN) 100 UNIT/ML FlexPen Inject 15 Units into the skin 3 (three) times daily with meals. Patient taking differently: Inject 30 Units into the skin 3 (three) times daily with meals.  06/26/19  Yes Mariel Aloe, MD  isosorbide mononitrate (IMDUR) 60 MG 24 hr tablet Take 1 tablet (60 mg total) by mouth daily. 07/05/19  Yes Eugenie Filler, MD  LANTUS SOLOSTAR 100 UNIT/ML Solostar Pen Inject 45 Units into the skin at bedtime. 06/14/19  Yes Pokhrel, Laxman, MD  loperamide (IMODIUM) 2 MG capsule Take 2 mg by mouth 4 (four) times daily as needed for diarrhea or loose stools.   Yes [provider]  magnesium oxide (MAG-OX) 400 (241.3 Mg) MG tablet Take 1 tablet (400 mg total) by mouth 2 (two) times daily. 06/14/19  Yes Pokhrel, Laxman, MD  Melatonin 3 MG TABS Take 2 tablets (6 mg total) by mouth at bedtime. 12/13/18  Yes Lockamy, Timothy, DO  metoprolol succinate (TOPROL-XL) 25 MG 24 hr tablet Take 25 mg by mouth daily. 06/01/19  Yes [provider]  nitroGLYCERIN (NITROSTAT) 0.4 MG SL tablet Place 1 tablet (0.4 mg total) under the tongue every 5 (five) minutes as needed for chest pain. 12/13/18  Yes Lockamy, Timothy, DO  ondansetron (ZOFRAN-ODT) 4 MG disintegrating tablet Take 4 mg by mouth every 8 (eight) hours as needed for nausea or vomiting.  06/07/19  Yes [provider]  pantoprazole (PROTONIX) 40 MG tablet Take 1 tablet (40 mg total) by mouth 2 (two) times daily. 07/04/19 08/03/19 Yes Eugenie Filler, MD  torsemide (DEMADEX) 20 MG tablet Take 2 tablets (40 mg total) by mouth daily. Hold if having significant vomiting 07/06/19  Yes Eugenie Filler, MD  blood glucose meter kit and supplies KIT Dispense based on patient and insurance preference. Use up to four times daily as directed. (FOR ICD-9 250.00, 250.01). 12/13/18   [name], DO  dicyclomine (BENTYL) 20 MG tablet TAKE 1 TABLET (20 MG TOTAL) BY MOUTH 3 (THREE) TIMES  DAILY AS NEEDED FOR SPASMS (ABDOMINAL CRAMPING). Patient not taking: Reported on 06/29/2019 06/22/19 07/22/19  [name], DO    Current Facility-Administered Medications  Medication Dose Route Frequency Provider Last Rate Last Admin  . apixaban (ELIQUIS) tablet 5 mg  5 mg Oral BID [place] Eric J, DO   5 mg at 07/11/19 0930  . atorvastatin (LIPITOR) tablet 10 mg  10 mg Oral Daily [place] Donnamarie Poag, DO   10 mg at 07/11/19 0930  . DULoxetine (CYMBALTA) DR capsule 40 mg  40 mg Oral BID Shela Leff, MD   40 mg at 07/11/19 0930  . erythromycin (E-MYCIN) tablet 250 mg  250 mg Oral TID WC & HS Shela Leff, MD   250 mg at 07/11/19 1230  . HYDROmorphone (DILAUDID) injection 1 mg  1 mg Intravenous Q4H PRN Shela Leff, MD   1 mg at 07/11/19 1211  . insulin aspart (novoLOG) injection 0-15 Units  0-15 Units Subcutaneous TID WC Shela Leff, MD   3 Units at 07/11/19 1230  . insulin glargine (LANTUS) injection 25 Units  25 Units Subcutaneous QHS [place] Eric J, DO   25 Units at 07/10/19 2118  . metoprolol succinate (TOPROL-XL) 24 hr tablet 25 mg  25 mg Oral Daily Shela Leff, MD   25 mg at 07/11/19 0930  . pantoprazole (PROTONIX) EC tablet 40 mg  40 mg Oral BID Shela Leff, MD   40 mg at 07/11/19 0930  . potassium chloride 10 mEq in 100 mL IVPB  10 mEq Intravenous Q1 Hr x 4 SheikhGeorgina Quint Muldrow, DO 100 mL/hr at 07/11/19 1351 10 mEq at 07/11/19 1351  . promethazine (PHENERGAN) injection 25 mg  25 mg Intravenous Q6H PRN Shela Leff, MD   25 mg at 07/11/19 0826    Allergies as of 07/09/2019 - Review Complete 07/09/2019  Allergen Reaction Noted  . Contrast media [iodinated diagnostic agents] Anaphylaxis 11/28/2017  . Diazepam [name] 09/21/2011  . Isovue [iopamidol] Anaphylaxis 11/28/2017  . Lisinopril Anaphylaxis 09/21/2011  . Penicillins Palpitations 02/04/2011  . Acetaminophen Nausea Only and Other (See Comments) 08/28/2015  . Tolmetin Nausea  Only and Other (See Comments) 09/21/2015  . Aspirin Other (See Comments) 09/26/2015  . Food Hives and Swelling 12/12/2013  . Nsaids Other (See Comments) 09/21/2015  . Tramadol Nausea And Vomiting 12/22/2015    Family History  Problem Relation Age of Onset  . Diabetes Mother   . Diabetes Father   . Heart disease Father   . Diabetes Sister   . Congestive Heart Failure Sister 71  . Diabetes Brother     Social History   Socioeconomic History  . Marital status: Single    Spouse name: Not on file  . Number of children: Not on file  . Years of education: Not on file  . Highest education level: Not on file  Occupational History  . Not on file  Tobacco Use  . Smoking status: Never Smoker  . Smokeless tobacco: Never Used  Substance and Sexual Activity  . Alcohol use: No  . Drug use: No  . Sexual activity: Not Currently    Birth control/protection: None  Other Topics Concern  . Not on file  Social History Narrative   ** Merged History Encounter **       Social Determinants of Health   Financial Resource Strain:   . Difficulty of Paying Living Expenses:   Food Insecurity:   . Worried About Charity fundraiser in the Last Year:   . Arboriculturist in the Last Year:   Transportation Needs:   . Film/video editor (Medical):   [name] Lack of Transportation (Non-Medical):   Physical Activity:   . Days of Exercise per Week:   . Minutes of Exercise per Session:   Stress:   . Feeling of Stress :   Social Connections:   . Frequency of Communication with Friends and Family:   . Frequency of Social Gatherings with Friends and Family:   . Attends Religious Services:   . Active Member of Clubs or Organizations:   . Attends Archivist Meetings:   [name] Marital Status:   Intimate Partner Violence:   . Fear of Current or Ex-Partner:   . Emotionally Abused:   [name] Physically Abused:   . Sexually Abused:     Review of Systems: Positive for: GI: Described in detail in HPI.     Gen: Denies any  fever, chills, rigors, night sweats, anorexia, fatigue, weakness, malaise, involuntary weight loss, and sleep disorder CV: Denies chest pain, angina, palpitations, syncope, orthopnea, PND, peripheral edema, and claudication. Resp: Denies dyspnea, cough, sputum, wheezing, coughing up blood. GU : Denies urinary burning, blood in urine, urinary frequency, urinary hesitancy, nocturnal urination, and urinary incontinence. MS: Denies joint pain or swelling.  Denies muscle weakness, cramps, atrophy.  Derm: Denies rash, itching, oral ulcerations, hives, unhealing ulcers.  Psych: Denies depression, anxiety, memory loss, suicidal ideation, hallucinations,  and confusion. Heme: Denies bruising, bleeding, and enlarged lymph nodes. Neuro:  Denies any headaches, dizziness, paresthesias. Endo:   DM,Denies any problems with thyroid, adrenal function.  Physical Exam: Vital signs in last 24 hours: Temp:  [97.3 F (36.3 C)-97.9 F (36.6 C)] 97.3 F (36.3 C) (05/05 1224) Pulse Rate:  [87-94] 87 (05/05 1224) Resp:  [18-24] 24 (05/05 1224) BP: (106-129)/(55-94) 127/70 (05/05 1224) SpO2:  [90 %-99 %] 98 % (05/05 1224) Last BM Date: 07/11/19  General:   Alert,  Well-developed, obese, pleasant and cooperative in NAD Head:  Normocephalic and atraumatic. Eyes:  Sclera clear, no icterus.   Prominent pallor Ears:  Normal auditory acuity. Nose:  No deformity, discharge,  or lesions. Mouth:  No deformity or lesions.  Oropharynx pink & moist. Neck:  Supple; no masses or thyromegaly. Lungs:  Clear throughout to auscultation.   No wheezes, crackles, or rhonchi. No acute distress. Heart:  Regular rate and rhythm; no murmurs, clicks, rubs,  or gallops. Extremities:  Without clubbing or edema. Neurologic:  Alert and  oriented x4;  grossly normal neurologically. Skin:  Intact without significant lesions or rashes. Psych:  Alert and cooperative. Normal mood and affect. Abdomen:  Soft, nontender and  nondistended. No masses, hepatosplenomegaly or hernias noted. Normal bowel sounds, without guarding, and without rebound.         Lab Results: Recent Labs    07/09/19 2249 07/10/19 0454 07/11/19 0459  WBC 18.6* 16.2* 12.7*  HGB 7.8* 8.6* 7.3*  HCT 25.5* 28.5* 24.9*  PLT 594* 452* 382   BMET Recent Labs    07/09/19 2249 07/10/19 0454 07/11/19 0459  NA 141 139 140  K 2.6* 3.4* 3.3*  CL 99 101 102  CO2 _0 GLUCOSE 119* 244* 184*  BUN 23* 20 21*  CREATININE 1.52* 1.11* 1.31*  CALCIUM 8.9 8.0* 7.7*   LFT Recent Labs    07/11/19 0459  PROT 6.2*  ALBUMIN 2.7*  AST 12*  ALT 10  ALKPHOS 78  BILITOT 0.5   PT/INR No results for input(s): LABPROT, INR in the last 72 hours.  Studies/Results: CT ABDOMEN PELVIS WO CONTRAST  Result Date: 07/10/2019 CLINICAL DATA:  Nausea and vomiting for 2 hours EXAM: CT ABDOMEN AND PELVIS WITHOUT CONTRAST TECHNIQUE: Multidetector CT imaging of the abdomen and pelvis was performed following the standard protocol without IV contrast. COMPARISON:  06/10/2019 FINDINGS: Lower chest:  No contributory findings. Hepatobiliary: No focal liver abnormality.Cholecystectomy without bile duct dilatation. Pancreas: Unremarkable. Spleen: Unremarkable. Adrenals/Urinary Tract: Negative adrenals. No hydronephrosis or stone. Unremarkable bladder. Stomach/Bowel:  No obstruction. No appendicitis. Vascular/Lymphatic: No acute vascular abnormality. No mass or adenopathy. Reproductive:5 cm left adnexal mass is an exophytic fibroid by 2019 pelvic MRI. Pelvic floor laxity. Other: No ascites or pneumoperitoneum. Musculoskeletal: No acute abnormalities. Spondylosis and advanced lower lumbar facet osteoarthritis. Biforaminal impingement at L4-5 where there is jild anterolisthesis. IMPRESSION: 1. No acute finding including bowel obstruction or visible inflammation. 2. Chronic findings are stable from  CT 1 month ago and described above. Electronically Signed   By: Monte Fantasia M.D.   On: 07/10/2019 04:55    Impression: Intractable nausea and vomiting in a patient with history of diabetic gastroparesis To be noted gastric emptying scan from 2018 was normal CT from 07/10/2019 showed no acute finding including bowel obstruction or visible inflammation Mild leukocytosis, mild normocytic anemia, hemoglobin 7.3, MCV 90.2 Iron panel from 07/04/2019 showed saturation only 5%, ferritin 48, normal TIBC, normal folate and vitamin B12 Blood glucose 184 today Hypokalemia, mild renal impairment, bilirubin 21, creatinine 1.31, GFR 53  Plan: Recommend low fiber, low residue diet, small frequent meals, good blood sugar control On erythromycin to 50 mg 3 times daily and at bedtime On Phenergan 25 mg IV every 6 hours as needed  Recommend avoiding narcotics, currently has Dilaudid 1 mg IV every 4 hours as needed ordered for severe pain Continue PPI 40 mg twice daily Hopefully, with good control of nausea and vomiting, diet can be advanced further. This will likely be a chronic problem and will require good blood sugar control on a long-term basis, and continuation of prokinetic medication as erythromycin even as an outpatient.   LOS: 1 day   Ronnette Juniper, MD  07/11/2019, 2:45 PM

## 2019-07-11 NOTE — Chronic Care Management (AMB) (Signed)
   Social Work  Care Management Collaboration 07/11/2019 Name: ROSEBUD KOENEN MRN: [id number] DOB: 1965-03-17  [name] is a 54 y.o. year old female who sees [name], DO for primary care. LCSW was consulted to assistance patient with  Level of Care Concerns for [place]. Patient was not interviewed or contacted during this encounter.  Phone appointment was scheduled today with LCSW however patient is in the hospital.  Recommendation: After collaboration with CCM RN care manager it is determined that CCM RN will check to see if patient is in the hospital on Friday 07/13/19. If patient is not in the hospital CCM RN will complete phone appointment with patient.  Intervention: LCSW collaborated with CCM RN for patient's needs. Review of patient status, including review of consultants reports, relevant laboratory and other test results, and collaboration with appropriate care team members and the patient's provider was performed as part of comprehensive patient evaluation and provision of chronic care management services.   Plan:  1. RN care manager will follow up with the patient for ongoing assessment of needs .    2. LCSW will continue to collaborate with CCM RN and process PCS referral after encounter with patient.  [name], [place] / [place]   [phone] 8:32 AM

## 2019-07-11 NOTE — Plan of Care (Signed)
  Problem: Education: Goal: Knowledge of General Education information will improve Description: Including pain rating scale, medication(s)/side effects and non-pharmacologic comfort measures Outcome: Progressing   Problem: Activity: Goal: Risk for activity intolerance will decrease Outcome: Progressing   Problem: Pain Managment: Goal: General experience of comfort will improve Outcome: Progressing   

## 2019-07-12 ENCOUNTER — Other Ambulatory Visit: Payer: Self-pay

## 2019-07-12 DIAGNOSIS — R0789 Other chest pain: Secondary | ICD-10-CM

## 2019-07-12 LAB — COMPREHENSIVE METABOLIC PANEL
ALT: 8 U/L (ref 0–44)
AST: 10 U/L — ABNORMAL LOW (ref 15–41)
Albumin: 2.7 g/dL — ABNORMAL LOW (ref 3.5–5.0)
Alkaline Phosphatase: 84 U/L (ref 38–126)
Anion gap: 10 (ref 5–15)
BUN: 21 mg/dL — ABNORMAL HIGH (ref 6–20)
CO2: 26 mmol/L (ref 22–32)
Calcium: 7.8 mg/dL — ABNORMAL LOW (ref 8.9–10.3)
Chloride: 99 mmol/L (ref 98–111)
Creatinine, Ser: 1.27 mg/dL — ABNORMAL HIGH (ref 0.44–1.00)
GFR calc Af Amer: 55 mL/min — ABNORMAL LOW (ref >60)
GFR calc non Af Amer: 47 mL/min — ABNORMAL LOW (ref >60)
Glucose, Bld: 234 mg/dL — ABNORMAL HIGH (ref 70–99)
Potassium: 3.7 mmol/L (ref 3.5–5.1)
Sodium: 135 mmol/L (ref 135–145)
Total Bilirubin: 0.6 mg/dL (ref 0.3–1.2)
Total Protein: 6.2 g/dL — ABNORMAL LOW (ref 6.5–8.1)

## 2019-07-12 LAB — GLUCOSE, CAPILLARY
Glucose-Capillary: 247 mg/dL — ABNORMAL HIGH (ref 70–99)
Glucose-Capillary: 263 mg/dL — ABNORMAL HIGH (ref 70–99)
Glucose-Capillary: 202 mg/dL — ABNORMAL HIGH (ref 70–99)

## 2019-07-12 LAB — CBC WITH DIFFERENTIAL/PLATELET
Abs Immature Granulocytes: 0.09 10*3/uL — ABNORMAL HIGH (ref 0.00–0.07)
Basophils Absolute: 0.1 10*3/uL (ref 0.0–0.1)
Basophils Relative: 0 %
Eosinophils Absolute: 0.4 10*3/uL (ref 0.0–0.5)
Eosinophils Relative: 3 %
HCT: 24 % — ABNORMAL LOW (ref 36.0–46.0)
Hemoglobin: 7.1 g/dL — ABNORMAL LOW (ref 12.0–15.0)
Immature Granulocytes: 1 %
Lymphocytes Relative: 11 %
Lymphs Abs: 1.7 10*3/uL (ref 0.7–4.0)
MCH: 26.8 pg (ref 26.0–34.0)
MCHC: 29.6 g/dL — ABNORMAL LOW (ref 30.0–36.0)
MCV: 90.6 fL (ref 80.0–100.0)
Monocytes Absolute: 0.6 10*3/uL (ref 0.1–1.0)
Monocytes Relative: 4 %
Neutro Abs: 12.5 10*3/uL — ABNORMAL HIGH (ref 1.7–7.7)
Neutrophils Relative %: 81 %
Platelets: 374 10*3/uL (ref 150–400)
RBC: 2.65 MIL/uL — ABNORMAL LOW (ref 3.87–5.11)
RDW: 15 % (ref 11.5–15.5)
WBC: 15.3 10*3/uL — ABNORMAL HIGH (ref 4.0–10.5)
nRBC: 0 % (ref 0.0–0.2)

## 2019-07-12 LAB — PHOSPHORUS: Phosphorus: 2.6 mg/dL (ref 2.5–4.6)

## 2019-07-12 LAB — MAGNESIUM: Magnesium: 1.9 mg/dL (ref 1.7–2.4)

## 2019-07-12 LAB — TROPONIN I (HIGH SENSITIVITY)
Troponin I (High Sensitivity): 26 ng/L — ABNORMAL HIGH (ref <18)
Troponin I (High Sensitivity): 22 ng/L — ABNORMAL HIGH (ref <18)

## 2019-07-12 MED ORDER — POTASSIUM CHLORIDE CRYS ER 20 MEQ PO TBCR
40.0000 meq | EXTENDED_RELEASE_TABLET | Freq: Once | ORAL | Status: AC
  Administered 2019-07-12: 40 meq via ORAL
  Filled 2019-07-12: qty 2

## 2019-07-12 NOTE — Progress Notes (Signed)
[facility] Progress Note  Deborah [name] 54 y.o. [birth year]  CC: Intractable nausea and vomiting  Subjective: Patient states she is still feeling unwell but feels better than yesterday.  She had nausea with one episode of emesis this morning.  She denies any hematemesis.  Endorses diffuse abdominal pain.  Also reports chest pain radiating into her shoulders.  Denies any sensation of heartburn or dysphagia.  Reports 1 loose bowel movement yesterday without any melena or hematochezia.  Tolerated eggs this morning.  ROS : Review of Systems  Respiratory: Negative for cough and shortness of breath.   Cardiovascular: Positive for chest pain. Negative for palpitations.  Gastrointestinal: Positive for abdominal pain, diarrhea, nausea and vomiting. Negative for blood in stool, constipation, heartburn and melena.   Objective: Vital signs in last 24 hours: Vitals:   07/12/19 0550 07/12/19 0553  BP: 111/60   Pulse: (!) 103 (!) 105  Resp: (!) 22 20  Temp: 98.6 F (37 C)   SpO2: (!) 80% 100%    Physical Exam:  General:  Alert, cooperative, no distress, obese  Head:  Normocephalic, without obvious abnormality, atraumatic  Eyes:  Conjunctival pallor, EOMs intact  Lungs:   Clear to auscultation bilaterally, respirations unlabored  Heart:  Mild tachycardia, regular rhythm, S1/S2 normal  Abdomen:   Soft, nondistended, mild diffuse tenderness, normocative bowel sounds, no peritoneal signs  Extremities: Extremities normal, atraumatic, no  edema  Pulses: 2+ and symmetric    Lab Results: Recent Labs    07/11/19 0459 07/12/19 0457  NA 140 135  K 3.3* 3.7  CL 102 99  CO2 28 26  GLUCOSE 184* 234*  BUN 21* 21*  CREATININE 1.31* 1.27*  CALCIUM 7.7* 7.8*  MG 2.1 1.9  PHOS  --  2.6   Recent Labs    07/11/19 0459 07/12/19 0457  AST 12* 10*  ALT 10 8  ALKPHOS 78 84  BILITOT 0.5 0.6  PROT 6.2* 6.2*  ALBUMIN 2.7* 2.7*   Recent Labs    07/11/19 0459 07/12/19 0457   WBC 12.7* 15.3*  NEUTROABS  --  12.5*  HGB 7.3* 7.1*  HCT 24.9* 24.0*  MCV 90.2 90.6  PLT 382 374   No results for input(s): LABPROT, INR in the last 72 hours.   Assessment/ Intractable nausea and vomiting -History of diabetic gastroparesis gastric emptying scan in 2018 was normal -No acute abdominal findings on CT 07/10/19 -On Erythromycin 50mg  TID and at bedtime -On Phenergan 25mg  IV q6h PRN  Normocytic anemia: Hemoglobin 7.1.  No signs of GI bleeding.  Mild leukocytosis, WBC is 15.3 today  Chest pain: Dr. Alfredia Ferguson made aware that patient is now having radiation of pain into bilateral shoulders.  EKD ordered.  Hypokalemia, now resolved.  K 3.7 today  DM type 2: Glucose 234 this morning  Mild renal insufficiency: Stable.  BUN 21/Cr 1.27 today   Plan: -Continue Erythromycin 250mg  PO TID and nightly -Offered short trial of Reglan x 3 doses; however, patient declined.  She was on Reglan for many years and is concerned about side effects.  Advised patient that Reglan use would only be short term (while hospitalized) but she deferred Rglan trial. -Continue Protonix 40mg  BID -Recommend low fiber, low residue diet, small frequent meals, good blood sugar control.  [facility] will follow.  Salley Slaughter PA-C 07/12/2019, 11:22 AM  Contact #  [phone]

## 2019-07-12 NOTE — Progress Notes (Signed)
PROGRESS NOTE    Deborah [name]  ZOX:[id number] DOB: 1965/11/28 DOA: 07/09/2019 PCP: [name], DO  Brief Narrative:  HPI per Dr. Shela Leff on 07/10/19 Deborah [name] is a 54 y.o. female with medical history significant of chronic systolic CHF (EF 40 to 81% on echo done 01/2019), paroxysmal A. fib on Eliquis, insulin-dependent type 2 diabetes with history of recurrent admissions for DKA (most recent 4/23-4/28), CKD stage III, hypertension, hyperlipidemia, CVA, gout, diabetic gastroparesis intolerant to Reglan, gastric ulcer, GERD, anemia of chronic disease, chronic pain, depression, morbid obesity (BMI 54.07) presenting to the ED via EMS with complaints of nausea and vomiting.  Patient states yesterday afternoon she had a ham sandwich and soon after started vomiting.  She also had some substernal sharp chest pain and shortness of breath at that time.  She has continued to have 10 out of 10 intensity generalized abdominal pain since then.  Also continue to have some chest pain since then.  She has continued to vomit and has not been able to tolerate any p.o. intake.  Does report having a normal bowel movement in the afternoon.  She does not recall taking erythromycin since her recent hospital discharge.  States her blood glucose has been in the 200s at home.  ED Course: Afebrile.  Tachycardic and tachypneic on arrival.  Not hypoxic.  WBC count 18.6.  Lactic acid 3.2.  Hemoglobin 7.8, stable compared to labs done during recent hospitalization.  Platelet count 594, previously normal.  Potassium 2.6.  Magnesium level 0.7.  Bicarb 25, anion gap 17.  Blood glucose 119.  BUN 23, creatinine 1.5.  Creatinine was 1.1 at the time of recent hospital discharge.  Lipase and LFTs normal.  UA pending.  Blood ethanol level undetectable.  Blood culture x2 pending.  UDS pending.  SARS-CoV-2 PCR test negative.  Influenza panel negative. No imaging done. Patient received Dilaudid, 1 L LR bolus, IV  magnesium 2 g, IV potassium supplementation, and Zofran.  **Interim History Continue to have some nausea vomiting and abdominal pain so her diet was slowly advanced.  Gastroenterology was consulted for further evaluation recommendations and patient is about to have her first solid meal since admission.  She continues to have small amount of emesis and GI continues to recommend low fiber, low residue diet and small frequent meals.  GI recommended some Reglan but patient wants avoid the side effects of tardive dyskinesia and tremors.  We will continue to monitor patient's diet tolerance.  Today she also had some chest discomfort that radiated into her shoulders but thinks it may be from the way that she slept okay sitting up has made it better.  Assessment & Plan:   Principal Problem:   Intractable nausea and vomiting Active Problems:   Hypokalemia   Leukocytosis   Abdominal pain   Lactic acidosis  Abdominal pain, intractable nausea and vomiting in the setting of Diabetic Gastroparesis  -Patient presenting with acute onset nausea/vomiting and abdominal pain.  Suspicion for diabetic gastroparesis.   -Patient is afebrile, but had elevated WBC count of 16.2 and a lactic acid of 3.2 on admission.  -Lipase and LFTs within normal limits.   -CT Abd/Pelvis showed "No acute finding including bowel obstruction or visible inflammation.  Chronic findings are stable from CT 1 month ago and described above." -Previous gastric emptying study 2018 that was unrevealing -N.p.o initially but now on a CLD and will attempt Soft Diet  -NS at 75 mL's per hour now stopped  -Started  on Erythromycin 50 mg p.o. 3 times daily with meals and nightly  -C/w Supportive care, Antiemetics with Promethazine 25 mg IV q6hprn Nausea and Vomiting  -C/w PPI with Pantoprazole 40 mg po BID  -Judicious use of Narcotic Medications; Currently has Hydromorphone 1 mg IV q4hprn Severe Pain  -Consulted gastroenterology for further  evaluation and recommendations appreciate their evaluation -GI recommended some recommended above patient wants to avoid it due to the side effect of tardive dyskinesia and tremors.  Today she is having her first solid meal since admission but she continues to have a small amount of emesis.  Gastroenterology recommending continuing low fiber, low residue diet and small frequent meals  Atypical Chest Pain -Improved initially but was having chest pain again and radiated into her shoulders -Initial EKG without acute ischemic changes.  High-sensitivity troponin 30 and trended down to 23 -Continue to trend troponin and that she is having it again and repeat troponin this morning was 26 and after that was 22 -Repeat EKG this AM showed a normal sinus rhythm at a rate of 94 with a QTC of 460 with no evidence of any ischemic changes on my interpretation -Replete Electrolytes -Continue to monitor on telemetry  Leukocytosis: -Etiology likely reactive from dehydration.   -Unlikely infectious etiology with normal procalcitonin.   -No acute findings on CT abdomen/pelvis.  -Patient's WBC was improving slowly and went from 16.2 down to 12.7 yesterday but slightly bumped up and is now 15.3 likely in the setting of her nausea and emesis -Urinalysis Still pending but UDS was Negative  -Blood cultures x2: NGTD at <24 Hours  -Continue to monitor CBC daily and continue to evaluate carefully  Lactic Acidosis -Likely secondary to dehydration.   -Unlikely infectious process.   -Procalcitonin less than 0.10. -Blood ethanol level undetectable.    -CT abdomen/pelvis negative. -Lactic Acid 3.2--> 2.1 --> 1.2 -IV fluid hydration is now stopped as she is improved and lactic acid level is now normalized  Hypokalemia -Likely related to decreased p.o. intake from intractable nausea/vomiting and home diuretic use.  -Severe hypomagnesemia also likely contributing (0.7) -K+ this AM was 3.7 -Replete with p.o. KCl  40 mEq x 1 -EKG without acute changes.  -Continue to hold home Torsemide -Continue to monitor electrolytes daily  Severe Hypomagnesemia -Improved.  Patient's magnesium went from 0.7 is now 1.9 -Continue to monitor and replete as necessary -Repeat magnesium level in the a.m.  AKI on CKD stage III:  -BUN 23, creatinine 1.5, creatinine was 1.1 at the time of recent hospital discharge.  -Suspect prerenal from dehydration and home diuretic use.  -Continue holding home diuretic with torsemide -Continued NS at 75 mL's per hour but this is now stpeopd -Patient's BUN/creatinine went from 20/1.11 -> 21/1.31 -> 21/1.27 -Avoid nephrotoxic medications, contrast dyes, hypotension and renally adjust medications -Repeat CMP in a.m.  Poorly controlled Insulin-Dependent Type 2 Diabetes  -Hemoglobin A1c A1c 8.4.   -Home regimen includes Lantus 45 units subcutaneously daily, NovoLog 15 subcutaneously 3 times daily AC. -Diabetic educator following, appreciate assistance -Restart Lantus at a lower dose, 25 units of insulin daily -C/w Insulin sliding scale for coverage with moderate NovoLog/scale AC -CBG's ranging from 182-993  Chronic Systolic CHF -EF 40 to 71% on TTE 01/2019. -No signs of volume overload at this time. -Currently holding home torsemide and IV fluid hydration is now stopped -Continue Metoprolol Succinate 25 mg p.o. daily -Continue to monitor volume status carefully and continue to monitor for signs and symptoms of volume overload -  Strict I's and O's and daily weights; She is +2.548 Liters since admission and Weight was not done and was 335  Paroxysmal Atrial Fibrilliation -Currently in sinus rhythm. Continue home Metoprolol XL 25 mg po Daily and Apixaban 5 mg po BID  GERD -Continue PPI with Pantoprazole 40 mg po BID  Anemia of chronic disease -Patient's hemoglobin/hematocrit went from 8.6/20.5 is now 7.1/24.0 -Anemia panel done on 06/26/2019 showed an iron level of  18, U IBC of 327, TIBC of 345, saturation ratios of 5%, ferritin level 48, folate level of 6.6, and vitamin B12 level of 251 -Continue to monitor for signs and symptoms of bleeding as she is anticoagulated with Apixaban; currently no overt bleeding noted -Repeat CBC in a.m.  Depression -Remained stable.  Patient maintained on home regimen of antidepressant medication with Duloxetine 40 mg po BID  Super Morbid Obesity -Estimated body mass index is 54.07 kg/m as calculated from the following:   Height as of this encounter: 5\' 6"  (1.676 m).   Weight as of this encounter: 152 kg. -Weight Loss and Dietary Counseling given   DVT prophylaxis: Anticoagulated with Apixaban 5 mg po BID  Code Status: FULL CODE  Family Communication: No family present at bedside  Disposition Plan: Patient is from home likely to be discharged home when she is able to tolerate a diet without abdominal pain or nausea.  Gastroenterology has been consulted given her suspected diabetic gastroparesis  Status is: Inpatient  Remains inpatient appropriate because:Persistent severe electrolyte disturbances, Ongoing active pain requiring inpatient pain management and Inpatient level of care appropriate due to severity of illness   Dispo: The patient is from: Home              Anticipated d/c is to: Home              Anticipated d/c date is: 1 day              Patient currently is not medically stable to d/c.   Consultants:   Gastroenterology    Procedures: None   Antimicrobials:  Anti-infectives (From admission, onward)   Start     Dose/Rate Route Frequency Ordered Stop   07/10/19 0800  erythromycin (E-MYCIN) tablet 250 mg     250 mg Oral 3 times daily with meals & bedtime 07/10/19 0342       Subjective: Seen and examined at bedside and states that she was doing a little bit better sitting in the chair however she is complaining of some chest pain earlier and she is thinking that it is related to the way that  she lay on the bed and slept.  Continues to have some emesis and had some emesis this morning.  Feeling little bit better but wanted to try a regular diet.  No other concerns or complaints at this time.  Objective: Vitals:   07/11/19 2133 07/12/19 0550 07/12/19 0553 07/12/19 1617  BP: 129/82 111/60  125/63  Pulse: 98 (!) 103 (!) 105 94  Resp: (!) 22 (!) 22 20 19   Temp: 98.6 F (37 C) 98.6 F (37 C)  98.1 F (36.7 C)  TempSrc: Oral Oral  Oral  SpO2: 95% (!) 80% 100% 98%  Weight:      Height:        Intake/Output Summary (Last 24 hours) at 07/12/2019 1658 Last data filed at 07/11/2019 1902 Gross per 24 hour  Intake 629 ml  Output -  Net 629 ml   [employer]  07/09/19 2240  Weight: (!) 152 kg   Examination: Physical Exam:  Constitutional: WN/WD super morbidly obese African-American female currently in NAD and appears calm Eyes: Lids and conjunctivae normal, sclerae anicteric  ENMT: External Ears, Nose appear normal. Grossly normal hearing. Mucous membranes are moist.  Neck: Appears normal, supple, no cervical masses, normal ROM, no appreciable thyromegaly: No JVD Respiratory: Diminished to auscultation bilaterally, no wheezing, rales, rhonchi or crackles. Normal respiratory effort and patient is not tachypenic. No accessory muscle use.  Unlabored breathing Cardiovascular: RRR, no murmurs / rubs / gallops. S1 and S2 auscultated.  Mild extremity edema. Abdomen: Soft, mildly-tender, distended secondary to body habitus. Bowel sounds positive.  GU: Deferred. Musculoskeletal: No clubbing / cyanosis of digits/nails. No joint deformity upper and lower extremities.  Skin: No rashes, lesions, ulcers. No induration; Warm and dry.  Neurologic: CN 2-12 grossly intact with no focal deficits. Romberg sign cerebellar reflexes not assessed.  Psychiatric: Normal judgment and insight. Alert and oriented x 3. Normal mood and appropriate affect.   Data Reviewed: I have personally reviewed  following labs and imaging studies  CBC: Recent Labs  Lab 07/09/19 2249 07/10/19 0454 07/11/19 0459 07/12/19 0457  WBC 18.6* 16.2* 12.7* 15.3*  NEUTROABS  --   --   --  12.5*  HGB 7.8* 8.6* 7.3* 7.1*  HCT 25.5* 28.5* 24.9* 24.0*  MCV 85.9 89.1 90.2 90.6  PLT 594* 452* 382 382   Basic Metabolic Panel: Recent Labs  Lab 07/09/19 2249 07/10/19 0454 07/11/19 0459 07/12/19 0457  NA 141 139 140 135  K 2.6* 3.4* 3.3* 3.7  CL 99 101 102 99  CO2 25 24 28 26   GLUCOSE 119* 244* 184* 234*  BUN 23* 20 21* 21*  CREATININE 1.52* 1.11* 1.31* 1.27*  CALCIUM 8.9 8.0* 7.7* 7.8*  MG 0.7*  --  2.1 1.9  PHOS  --   --   --  2.6   GFR: Estimated Creatinine Clearance: 76.2 mL/min (A) (by C-G formula based on SCr of 1.27 mg/dL (H)). Liver Function Tests: Recent Labs  Lab 07/09/19 2249 07/11/19 0459 07/12/19 0457  AST 21 12* 10*  ALT 14 10 8   ALKPHOS 101 78 84  BILITOT 1.0 0.5 0.6  PROT 8.3* 6.2* 6.2*  ALBUMIN 3.6 2.7* 2.7*   Recent Labs  Lab 07/09/19 2249  LIPASE 24   No results for input(s): AMMONIA in the last 168 hours. Coagulation Profile: No results for input(s): INR, PROTIME in the last 168 hours. Cardiac Enzymes: No results for input(s): CKTOTAL, CKMB, CKMBINDEX, TROPONINI in the last 168 hours. BNP (last 3 results) No results for input(s): PROBNP in the last 8760 hours. HbA1C: Recent Labs    07/10/19 0454  HGBA1C 8.4*   CBG: Recent Labs  Lab 07/11/19 1212 07/11/19 1629 07/11/19 2129 07/12/19 1138 07/12/19 1634  GLUCAP 159* 271* 281* 247* 263*   Lipid Profile: No results for input(s): CHOL, HDL, LDLCALC, TRIG, CHOLHDL, LDLDIRECT in the last 72 hours. Thyroid Function Tests: No results for input(s): TSH, T4TOTAL, FREET4, T3FREE, THYROIDAB in the last 72 hours. Anemia Panel: No results for input(s): VITAMINB12, FOLATE, FERRITIN, TIBC, IRON, RETICCTPCT in the last 72 hours. Sepsis Labs: Recent Labs  Lab 07/10/19 0119 07/10/19 0454 07/11/19 0459   PROCALCITON  --  <0.10  --   LATICACIDVEN 3.2* 2.1* 1.2    Recent Results (from the past 240 hour(s))  Respiratory Panel by RT PCR (Flu A&B, Covid) - Nasopharyngeal Swab     Status: None  Collection Time: 07/10/19  1:19 AM   Specimen: Nasopharyngeal Swab  Result Value Ref Range Status   SARS Coronavirus 2 by RT PCR NEGATIVE NEGATIVE Final    Comment: (NOTE) SARS-CoV-2 target nucleic acids are NOT DETECTED. The SARS-CoV-2 RNA is generally detectable in upper respiratoy specimens during the acute phase of infection. The lowest concentration of SARS-CoV-2 viral copies this assay can detect is 131 copies/mL. A negative result does not preclude SARS-Cov-2 infection and should not be used as the sole basis for treatment or other patient management decisions. A negative result may occur with  improper specimen collection/handling, submission of specimen other than nasopharyngeal swab, presence of viral mutation(s) within the areas targeted by this assay, and inadequate number of viral copies (<131 copies/mL). A negative result must be combined with clinical observations, patient history, and epidemiological information. The expected result is Negative. Fact Sheet for Patients:  PinkCheek.be Fact Sheet for Healthcare Providers:  GravelBags.it This test is not yet ap proved or cleared by the [place] FDA and  has been authorized for detection and/or diagnosis of SARS-CoV-2 by FDA under an Emergency Use Authorization (EUA). This EUA will remain  in effect (meaning this test can be used) for the duration of the COVID-19 declaration under Section 564(b)(1) of the Act, 21 U.S.C. section 360bbb-3(b)(1), unless the authorization is terminated or revoked sooner.    Influenza A by PCR NEGATIVE NEGATIVE Final   Influenza B by PCR NEGATIVE NEGATIVE Final    Comment: (NOTE) The Xpert Xpress SARS-CoV-2/FLU/RSV assay is intended as an  aid in  the diagnosis of influenza from Nasopharyngeal swab specimens and  should not be used as a sole basis for treatment. Nasal washings and  aspirates are unacceptable for Xpert Xpress SARS-CoV-2/FLU/RSV  testing. Fact Sheet for Patients: PinkCheek.be Fact Sheet for Healthcare Providers: GravelBags.it This test is not yet approved or cleared by the [place] FDA and  has been authorized for detection and/or diagnosis of SARS-CoV-2 by  FDA under an Emergency Use Authorization (EUA). This EUA will remain  in effect (meaning this test can be used) for the duration of the  Covid-19 declaration under Section 564(b)(1) of the Act, 21  U.S.C. section 360bbb-3(b)(1), unless the authorization is  terminated or revoked. Performed at [facility], [name] [address] [place], [place] [address]   Culture, blood (routine x 2)     Status: None (Preliminary result)   Collection Time: 07/10/19  1:19 AM   Specimen: BLOOD  Result Value Ref Range Status   Specimen Description   Final    BLOOD BLOOD RIGHT FOREARM Performed at [facility] [address] [place], [place] [address]    Special Requests   Final    BOTTLES DRAWN AEROBIC AND ANAEROBIC Blood Culture adequate volume Performed at [facility] [address], [place], [place] [address]    Culture   Final    NO GROWTH 2 DAYS Performed at [facility] [address], [place], [place] [address]    Report Status PENDING  Incomplete  Culture, blood (routine x 2)     Status: None (Preliminary result)   Collection Time: 07/10/19  4:54 AM   Specimen: BLOOD  Result Value Ref Range Status   Specimen Description   Final    BLOOD BLOOD LEFT FOREARM Performed at [facility] [address] [place], [place] [address]    Special Requests   Final    BOTTLES DRAWN  AEROBIC AND ANAEROBIC Blood Culture  adequate volume Performed at [facility] [address] [place], [place] [address]    Culture   Final    NO GROWTH 2 DAYS Performed at [facility] [address], [place], [place] [address]    Report Status PENDING  Incomplete    RN Pressure Injury Documentation:     Estimated body mass index is 54.07 kg/m as calculated from the following:   Height as of this encounter: 5\' 6"  (1.676 m).   Weight as of this encounter: 152 kg.  Malnutrition Type:      Malnutrition Characteristics:      Nutrition Interventions:    Radiology Studies: No results found. Scheduled Meds: . apixaban  5 mg Oral BID  . atorvastatin  10 mg Oral Daily  . DULoxetine  40 mg Oral BID  . erythromycin  250 mg Oral TID WC & HS  . insulin aspart  0-15 Units Subcutaneous TID WC  . insulin glargine  25 Units Subcutaneous QHS  . metoprolol succinate  25 mg Oral Daily  . pantoprazole  40 mg Oral BID   Continuous Infusions:   LOS: 2 days   Kerney Elbe, DO [facility] PAGER is on AMION  If 7PM-7AM, please contact night-coverage [email or web address]

## 2019-07-13 ENCOUNTER — Telehealth: Payer: Medicare Other

## 2019-07-13 ENCOUNTER — Ambulatory Visit: Payer: Self-pay

## 2019-07-13 LAB — COMPREHENSIVE METABOLIC PANEL
ALT: 9 U/L (ref 0–44)
AST: 8 U/L — ABNORMAL LOW (ref 15–41)
Albumin: 2.6 g/dL — ABNORMAL LOW (ref 3.5–5.0)
Alkaline Phosphatase: 75 U/L (ref 38–126)
Anion gap: 8 (ref 5–15)
BUN: 23 mg/dL — ABNORMAL HIGH (ref 6–20)
CO2: 26 mmol/L (ref 22–32)
Calcium: 8.6 mg/dL — ABNORMAL LOW (ref 8.9–10.3)
Chloride: 102 mmol/L (ref 98–111)
Creatinine, Ser: 1.53 mg/dL — ABNORMAL HIGH (ref 0.44–1.00)
GFR calc Af Amer: 44 mL/min — ABNORMAL LOW (ref >60)
GFR calc non Af Amer: 38 mL/min — ABNORMAL LOW (ref >60)
Glucose, Bld: 194 mg/dL — ABNORMAL HIGH (ref 70–99)
Potassium: 3.8 mmol/L (ref 3.5–5.1)
Sodium: 136 mmol/L (ref 135–145)
Total Bilirubin: 0.6 mg/dL (ref 0.3–1.2)
Total Protein: 6.2 g/dL — ABNORMAL LOW (ref 6.5–8.1)

## 2019-07-13 LAB — CBC WITH DIFFERENTIAL/PLATELET
Abs Immature Granulocytes: 0.06 10*3/uL (ref 0.00–0.07)
Basophils Absolute: 0.1 10*3/uL (ref 0.0–0.1)
Basophils Relative: 1 %
Eosinophils Absolute: 0.3 10*3/uL (ref 0.0–0.5)
Eosinophils Relative: 3 %
HCT: 23.5 % — ABNORMAL LOW (ref 36.0–46.0)
Hemoglobin: 7 g/dL — ABNORMAL LOW (ref 12.0–15.0)
Immature Granulocytes: 1 %
Lymphocytes Relative: 25 %
Lymphs Abs: 2.6 10*3/uL (ref 0.7–4.0)
MCH: 26.8 pg (ref 26.0–34.0)
MCHC: 29.8 g/dL — ABNORMAL LOW (ref 30.0–36.0)
MCV: 90 fL (ref 80.0–100.0)
Monocytes Absolute: 0.6 10*3/uL (ref 0.1–1.0)
Monocytes Relative: 6 %
Neutro Abs: 6.6 10*3/uL (ref 1.7–7.7)
Neutrophils Relative %: 64 %
Platelets: 361 10*3/uL (ref 150–400)
RBC: 2.61 MIL/uL — ABNORMAL LOW (ref 3.87–5.11)
RDW: 14.6 % (ref 11.5–15.5)
WBC: 10.1 10*3/uL (ref 4.0–10.5)
nRBC: 0 % (ref 0.0–0.2)

## 2019-07-13 LAB — GLUCOSE, CAPILLARY
Glucose-Capillary: 200 mg/dL — ABNORMAL HIGH (ref 70–99)
Glucose-Capillary: 185 mg/dL — ABNORMAL HIGH (ref 70–99)
Glucose-Capillary: 158 mg/dL — ABNORMAL HIGH (ref 70–99)
Glucose-Capillary: 237 mg/dL — ABNORMAL HIGH (ref 70–99)

## 2019-07-13 LAB — PHOSPHORUS: Phosphorus: 3.2 mg/dL (ref 2.5–4.6)

## 2019-07-13 LAB — MAGNESIUM: Magnesium: 2 mg/dL (ref 1.7–2.4)

## 2019-07-13 NOTE — Care Management Important Message (Signed)
Important Message  Patient Details IM Letter given to Evette Cristal SW Case Manager to present to the Patient Name: LAIAH POUNCEY MRN: [id number] Date of Birth: 09/15/1965   Medicare Important Message Given:  Yes     Kerin Salen 07/13/2019, 11:35 AM

## 2019-07-13 NOTE — Progress Notes (Signed)
PROGRESS NOTE    Deborah [name]  KPT:[id number] DOB: June 27, 1965 DOA: 07/09/2019 PCP: [name], DO  Brief Narrative:  HPI per Dr. Shela Leff on 07/10/19 Deborah [name] is a 54 y.o. female with medical history significant of chronic systolic CHF (EF 40 to 17% on echo done 01/2019), paroxysmal A. fib on Eliquis, insulin-dependent type 2 diabetes with history of recurrent admissions for DKA (most recent 4/23-4/28), CKD stage III, hypertension, hyperlipidemia, CVA, gout, diabetic gastroparesis intolerant to Reglan, gastric ulcer, GERD, anemia of chronic disease, chronic pain, depression, morbid obesity (BMI 54.07) presenting to the ED via EMS with complaints of nausea and vomiting.  Patient states yesterday afternoon she had a ham sandwich and soon after started vomiting.  She also had some substernal sharp chest pain and shortness of breath at that time.  She has continued to have 10 out of 10 intensity generalized abdominal pain since then.  Also continue to have some chest pain since then.  She has continued to vomit and has not been able to tolerate any p.o. intake.  Does report having a normal bowel movement in the afternoon.  She does not recall taking erythromycin since her recent hospital discharge.  States her blood glucose has been in the 200s at home.  ED Course: Afebrile.  Tachycardic and tachypneic on arrival.  Not hypoxic.  WBC count 18.6.  Lactic acid 3.2.  Hemoglobin 7.8, stable compared to labs done during recent hospitalization.  Platelet count 594, previously normal.  Potassium 2.6.  Magnesium level 0.7.  Bicarb 25, anion gap 17.  Blood glucose 119.  BUN 23, creatinine 1.5.  Creatinine was 1.1 at the time of recent hospital discharge.  Lipase and LFTs normal.  UA pending.  Blood ethanol level undetectable.  Blood culture x2 pending.  UDS pending.  SARS-CoV-2 PCR test negative.  Influenza panel negative. No imaging done. Patient received Dilaudid, 1 L LR bolus, IV  magnesium 2 g, IV potassium supplementation, and Zofran.  **Interim History Continue to have some nausea vomiting and abdominal pain so her diet was slowly advanced.  Gastroenterology was consulted for further evaluation recommendations and she states that she ate half of her burger yesterday but continues to have some nausea and vomiting and states that she is having some loose watery bowel movements..  She continues to have small amount of emesis and GI continues to recommend low fiber, low residue diet and small frequent meals.  GI recommended some Reglan but patient wants avoid the side effects of tardive dyskinesia and tremors.  We will continue to monitor patient's diet tolerance.  Yesterday she also had some chest discomfort that radiated into her shoulders but thinks it may be from the way that she slept okay sitting up has made it better.  Assessment & Plan:   Principal Problem:   Intractable nausea and vomiting Active Problems:   Hypokalemia   Leukocytosis   Abdominal pain   Lactic acidosis  Abdominal pain, intractable nausea and vomiting in the setting of Diabetic Gastroparesis  -Patient presenting with acute onset nausea/vomiting and abdominal pain.  Suspicion for diabetic gastroparesis.   -Patient is afebrile, but had elevated WBC count of 16.2 and a lactic acid of 3.2 on admission.  -Lipase and LFTs within normal limits.   -CT Abd/Pelvis showed "No acute finding including bowel obstruction or visible inflammation.  Chronic findings are stable from CT 1 month ago and described above." -Previous gastric emptying study 2018 that was unrevealing -N.p.o initially but now on  a CLD and will attempt Soft Diet  -NS at 75 mL's per hour now stopped  -Started on Erythromycin 50 mg p.o. 3 times daily with meals and nightly and will continue -C/w Supportive care, Antiemetics with Promethazine 25 mg IV q6hprn Nausea and Vomiting  -C/w PPI with Pantoprazole 40 mg po BID  -Judicious use of  Narcotic Medications; Currently has Hydromorphone 1 mg IV q4hprn Severe Pain  -Consulted gastroenterology for further evaluation and recommendations appreciate their evaluation -GI recommended some recommended above patient wants to avoid it due to the side effect of tardive dyskinesia and tremors.  Yesterday she is having her first solid meal since admission but she continues to have a small amount of emesis and has been nauseous.  Gastroenterology recommending continuing low fiber, low residue diet and small frequent meals -CBG's are better controlled on ranging from 158-263 -continue supportive management given that this is slow to improve  Atypical Chest Pain -Improved initially but was having chest pain again and radiated into her shoulders -Initial EKG without acute ischemic changes.  High-sensitivity troponin 30 and trended down to 23 -Continue to trend troponin and that she is having it again and repeat troponin this morning was 26 and after that was 22 -Repeat EKG this AM showed a normal sinus rhythm at a rate of 94 with a QTC of 460 with no evidence of any ischemic changes on my interpretation -Replete Electrolytes -Continue to monitor on telemetry  Leukocytosis: -Etiology likely reactive from dehydration.   -Unlikely infectious etiology with normal procalcitonin.   -No acute findings on CT abdomen/pelvis.  -Patient's WBC is now improved to 10.1 -Urinalysis Still pending but UDS was Negative  -Blood cultures x2: NGTD at 3 days -Continue to monitor CBC daily and continue to evaluate carefully  Lactic Acidosis -Likely secondary to dehydration.   -Unlikely infectious process.   -Procalcitonin less than 0.10. -Blood ethanol level undetectable.    -CT abdomen/pelvis negative. -Lactic Acid 3.2--> 2.1 --> 1.2 -IV fluid hydration is now stopped as she is improved and lactic acid level is now normalized  Hypokalemia -Likely related to decreased p.o. intake from intractable  nausea/vomiting and home diuretic use.  -Severe hypomagnesemia also likely contributing (0.7 on admission) -K+ this AM was 3.8 -EKG without acute changes.  -Continue to hold home Torsemide -Continue to monitor electrolytes daily  Severe Hypomagnesemia -Improved.  Patient's magnesium went from 0.7 is now 2.0 -Continue to monitor and replete as necessary -Repeat magnesium level in the a.m.  AKI on CKD stage III:  -BUN 23, creatinine 1.5, creatinine was 1.1 at the time of recent hospital discharge.  -Suspect prerenal from dehydration and home diuretic use.  -Continue holding home diuretic with torsemide -Continued NS at 75 mL's per hour but this is now stopped -Patient's BUN/creatinine went from 20/1.11 -> 21/1.31 -> 21/1.27 -> 23/1.53 and in the setting of emesis and may need to go back on IV fluids if she continues to worsen -Avoid nephrotoxic medications, contrast dyes, hypotension and renally adjust medications -Repeat CMP in a.m.  Poorly controlled Insulin-Dependent Type 2 Diabetes  -Hemoglobin A1c A1c 8.4.   -Home regimen includes Lantus 45 units subcutaneously daily, NovoLog 15 subcutaneously 3 times daily AC. -Diabetic educator following, appreciate assistance -Restart Lantus at a lower dose, 25 units of insulin daily -C/w Insulin sliding scale for coverage with moderate NovoLog/scale AC -CBG's ranging from 158-263 now  Chronic Systolic CHF -EF 40 to 37% on TTE 01/2019. -No signs of volume overload at this time. -  Currently holding home torsemide and IV fluid hydration is now stopped; will need to continue monitor carefully -Continue Metoprolol Succinate 25 mg p.o. daily -Continue to monitor volume status carefully and continue to monitor for signs and symptoms of volume overload -Strict I's and O's and daily weights; She is +2.548 Liters since admission and Weight was not done and was 335  Paroxysmal Atrial Fibrilliation -Currently in sinus rhythm. Continue home  Metoprolol XL 25 mg po Daily and Apixaban 5 mg po BID  GERD -Continue PPI with Pantoprazole 40 mg po BID  Anemia of chronic disease -Patient's hemoglobin/hematocrit went from 8.6/20.5 is now 7.0/23.5 -Anemia panel done on 06/26/2019 showed an iron level of 18, U IBC of 327, TIBC of 345, saturation ratios of 5%, ferritin level 48, folate level of 6.6, and vitamin B12 level of 251 -Continue to monitor for signs and symptoms of bleeding as she is anticoagulated with Apixaban; currently no overt bleeding noted -Repeat CBC in a.m.  Depression -Remained stable.  Patient maintained on home regimen of antidepressant medication with Duloxetine 40 mg po BID  Super Morbid Obesity -Estimated body mass index is 54.38 kg/m as calculated from the following:   Height as of this encounter: 5\' 6"  (1.676 m).   Weight as of this encounter: 152.8 kg. -Weight Loss and Dietary Counseling given   DVT prophylaxis: Anticoagulated with Apixaban 5 mg po BID  Code Status: FULL CODE  Family Communication: No family present at bedside  Disposition Plan: Patient is from home likely to be discharged home when she is able to tolerate a diet without abdominal pain or nausea.  Gastroenterology has been consulted given her suspected diabetic gastroparesis.  We have also consulted PT OT to further evaluate and treat  Status is: Inpatient  Remains inpatient appropriate because:Persistent severe electrolyte disturbances, Ongoing active pain requiring inpatient pain management and Inpatient level of care appropriate due to severity of illness; continues to have abdominal pain and nausea vomiting and p.o. intake has been intermittent   Dispo: The patient is from: Home              Anticipated d/c is to: Home              Anticipated d/c date is: 1 day              Patient currently is not medically stable to d/c.   Consultants:   Gastroenterology    Procedures: None   Antimicrobials:  Anti-infectives (From  admission, onward)   Start     Dose/Rate Route Frequency Ordered Stop   07/10/19 0800  erythromycin (E-MYCIN) tablet 250 mg     250 mg Oral 3 times daily with meals & bedtime 07/10/19 0342       Subjective: Seen and examined at bedside and states that she is not feeling as good today as she was yesterday.  States that she is having some stomach pain and described as a dull aching pain.  Had some nausea and emesis earlier and only ate half of her burger yesterday.  No other concerns or complaints at this time and denies any chest pain.  Objective: Vitals:   07/12/19 2046 07/13/19 0624 07/13/19 0625 07/13/19 1328  BP: 122/66 121/78  122/86  Pulse: 90 91  80  Resp: 20 18  18   Temp: 97.6 F (36.4 C) (!) 97.5 F (36.4 C)  98.1 F (36.7 C)  TempSrc: Oral Oral  Oral  SpO2: 93% 91%  96%  Weight:   [name])  152.8 kg   Height:       No intake or output data in the 24 hours ending 07/13/19 1955 Filed Weights   07/09/19 2240 07/13/19 0625  Weight: (!) 152 kg (!) 152.8 kg   Examination: Physical Exam:  Constitutional: WN/WD super morbidly obese African-American female currently in mild acute distress and does appear very uncomfortable laying in the bed in some abdominal pain. Eyes: Lids and conjunctivae normal, sclerae anicteric  ENMT: External Ears, Nose appear normal. Grossly normal hearing. Mucous membranes are moist.  Neck: Appears normal, supple, no cervical masses, normal ROM, no appreciable thyromegaly; no JVD Respiratory: Diminished to auscultation bilaterally, no wheezing, rales, rhonchi or crackles. Normal respiratory effort and patient is not tachypenic. No accessory muscle use.  Unlabored breathing Cardiovascular: RRR, no murmurs / rubs / gallops. S1 and S2 auscultated. No extremity edema. 2+ pedal pulses. No carotid bruits.  Abdomen: Soft, tender, distended secondary to body habitus. No masses palpated. No appreciable hepatosplenomegaly. Bowel sounds positive.  GU:  Deferred. Musculoskeletal: No clubbing / cyanosis of digits/nails. No joint deformity upper and lower extremities.  Skin: No rashes, lesions, ulcers on limited skin evaluation. No induration; Warm and dry.  Neurologic: CN 2-12 grossly intact with no focal deficits. Romberg sign and cerebellar reflexes not assessed.  Psychiatric: Normal judgment and insight. Alert and oriented x 3.  Slightly anxious mood and appropriate affect.   Data Reviewed: I have personally reviewed following labs and imaging studies  CBC: Recent Labs  Lab 07/09/19 2249 07/10/19 0454 07/11/19 0459 07/12/19 0457 07/13/19 0436  WBC 18.6* 16.2* 12.7* 15.3* 10.1  NEUTROABS  --   --   --  12.5* 6.6  HGB 7.8* 8.6* 7.3* 7.1* 7.0*  HCT 25.5* 28.5* 24.9* 24.0* 23.5*  MCV 85.9 89.1 90.2 90.6 90.0  PLT 594* 452* 382 374 466   Basic Metabolic Panel: Recent Labs  Lab 07/09/19 2249 07/10/19 0454 07/11/19 0459 07/12/19 0457 07/13/19 0436  NA 141 139 140 135 136  K 2.6* 3.4* 3.3* 3.7 3.8  CL 99 101 102 99 102  CO2 25 24 28 26 26   GLUCOSE 119* 244* 184* 234* 194*  BUN 23* 20 21* 21* 23*  CREATININE 1.52* 1.11* 1.31* 1.27* 1.53*  CALCIUM 8.9 8.0* 7.7* 7.8* 8.6*  MG 0.7*  --  2.1 1.9 2.0  PHOS  --   --   --  2.6 3.2   GFR: Estimated Creatinine Clearance: 63.4 mL/min (A) (by C-G formula based on SCr of 1.53 mg/dL (H)). Liver Function Tests: Recent Labs  Lab 07/09/19 2249 07/11/19 0459 07/12/19 0457 07/13/19 0436  AST 21 12* 10* 8*  ALT 14 10 8 9   ALKPHOS 101 78 84 75  BILITOT 1.0 0.5 0.6 0.6  PROT 8.3* 6.2* 6.2* 6.2*  ALBUMIN 3.6 2.7* 2.7* 2.6*   Recent Labs  Lab 07/09/19 2249  LIPASE 24   No results for input(s): AMMONIA in the last 168 hours. Coagulation Profile: No results for input(s): INR, PROTIME in the last 168 hours. Cardiac Enzymes: No results for input(s): CKTOTAL, CKMB, CKMBINDEX, TROPONINI in the last 168 hours. BNP (last 3 results) No results for input(s): PROBNP in the last 8760  hours. HbA1C: No results for input(s): HGBA1C in the last 72 hours. CBG: Recent Labs  Lab 07/12/19 1138 07/12/19 1634 07/12/19 2048 07/13/19 0753 07/13/19 1227  GLUCAP 247* 263* 202* 185* 158*   Lipid Profile: No results for input(s): CHOL, HDL, LDLCALC, TRIG, CHOLHDL, LDLDIRECT in the last 72  hours. Thyroid Function Tests: No results for input(s): TSH, T4TOTAL, FREET4, T3FREE, THYROIDAB in the last 72 hours. Anemia Panel: No results for input(s): VITAMINB12, FOLATE, FERRITIN, TIBC, IRON, RETICCTPCT in the last 72 hours. Sepsis Labs: Recent Labs  Lab 07/10/19 0119 07/10/19 0454 07/11/19 0459  PROCALCITON  --  <0.10  --   LATICACIDVEN 3.2* 2.1* 1.2    Recent Results (from the past 240 hour(s))  Respiratory Panel by RT PCR (Flu A&B, Covid) - Nasopharyngeal Swab     Status: None   Collection Time: 07/10/19  1:19 AM   Specimen: Nasopharyngeal Swab  Result Value Ref Range Status   SARS Coronavirus 2 by RT PCR NEGATIVE NEGATIVE Final    Comment: (NOTE) SARS-CoV-2 target nucleic acids are NOT DETECTED. The SARS-CoV-2 RNA is generally detectable in upper respiratoy specimens during the acute phase of infection. The lowest concentration of SARS-CoV-2 viral copies this assay can detect is 131 copies/mL. A negative result does not preclude SARS-Cov-2 infection and should not be used as the sole basis for treatment or other patient management decisions. A negative result may occur with  improper specimen collection/handling, submission of specimen other than nasopharyngeal swab, presence of viral mutation(s) within the areas targeted by this assay, and inadequate number of viral copies (<131 copies/mL). A negative result must be combined with clinical observations, patient history, and epidemiological information. The expected result is Negative. Fact Sheet for Patients:  PinkCheek.be Fact Sheet for Healthcare Providers:   GravelBags.it This test is not yet ap proved or cleared by the [place] FDA and  has been authorized for detection and/or diagnosis of SARS-CoV-2 by FDA under an Emergency Use Authorization (EUA). This EUA will remain  in effect (meaning this test can be used) for the duration of the COVID-19 declaration under Section 564(b)(1) of the Act, 21 U.S.C. section 360bbb-3(b)(1), unless the authorization is terminated or revoked sooner.    Influenza A by PCR NEGATIVE NEGATIVE Final   Influenza B by PCR NEGATIVE NEGATIVE Final    Comment: (NOTE) The Xpert Xpress SARS-CoV-2/FLU/RSV assay is intended as an aid in  the diagnosis of influenza from Nasopharyngeal swab specimens and  should not be used as a sole basis for treatment. Nasal washings and  aspirates are unacceptable for Xpert Xpress SARS-CoV-2/FLU/RSV  testing. Fact Sheet for Patients: PinkCheek.be Fact Sheet for Healthcare Providers: GravelBags.it This test is not yet approved or cleared by the [place] FDA and  has been authorized for detection and/or diagnosis of SARS-CoV-2 by  FDA under an Emergency Use Authorization (EUA). This EUA will remain  in effect (meaning this test can be used) for the duration of the  Covid-19 declaration under Section 564(b)(1) of the Act, 21  U.S.C. section 360bbb-3(b)(1), unless the authorization is  terminated or revoked. Performed at [facility], [place] [address] [place], [place] [address]   Culture, blood (routine x 2)     Status: None (Preliminary result)   Collection Time: 07/10/19  1:19 AM   Specimen: BLOOD  Result Value Ref Range Status   Specimen Description   Final    BLOOD BLOOD RIGHT FOREARM Performed at [facility] [address] [place], [place] [address]    Special Requests   Final    BOTTLES DRAWN AEROBIC AND ANAEROBIC Blood Culture adequate  volume Performed at [facility] [address], [place], [place] [address]    Culture   Final    NO GROWTH 3 DAYS Performed  at [facility], [place] [address] [place], [place] [address]    Report Status PENDING  Incomplete  Culture, blood (routine x 2)     Status: None (Preliminary result)   Collection Time: 07/10/19  4:54 AM   Specimen: BLOOD  Result Value Ref Range Status   Specimen Description   Final    BLOOD BLOOD LEFT FOREARM Performed at [facility] [address], [place], [place] [address]    Special Requests   Final    BOTTLES DRAWN AEROBIC AND ANAEROBIC Blood Culture adequate volume Performed at [facility] [address] [place], [place] [address]    Culture   Final    NO GROWTH 3 DAYS Performed at [facility], [place] [address], [place], [place] [address]    Report Status PENDING  Incomplete    RN Pressure Injury Documentation:     Estimated body mass index is 54.38 kg/m as calculated from the following:   Height as of this encounter: 5\' 6"  (1.676 m).   Weight as of this encounter: 152.8 kg.  Malnutrition Type:      Malnutrition Characteristics:      Nutrition Interventions:    Radiology Studies: No results found. Scheduled Meds: . apixaban  5 mg Oral BID  . atorvastatin  10 mg Oral Daily  . DULoxetine  40 mg Oral BID  . erythromycin  250 mg Oral TID WC & HS  . insulin aspart  0-15 Units Subcutaneous TID WC  . insulin glargine  25 Units Subcutaneous QHS  . metoprolol succinate  25 mg Oral Daily  . pantoprazole  40 mg Oral BID   Continuous Infusions:   LOS: 3 days   Kerney Elbe, DO [facility] PAGER is on [place]  If 7PM-7AM, please contact night-coverage [email or web address]

## 2019-07-13 NOTE — Progress Notes (Signed)
Subjective: Patient states she ate half of burger yesterday and vomited it 30 minutes later. Thereafter, she was able to eat the second half and keep it down. Last bowel movement was today, described as loose and watery. She complains of mild generalized abdominal pain. She wants to try some soup for lunch.  Objective: Vital signs in last 24 hours: Temp:  [97.5 F (36.4 C)-98.1 F (36.7 C)] 97.5 F (36.4 C) (05/07 0624) Pulse Rate:  [90-94] 91 (05/07 0624) Resp:  [18-20] 18 (05/07 0624) BP: (121-125)/(63-78) 121/78 (05/07 0624) SpO2:  [91 %-98 %] 91 % (05/07 0624) Weight:  [152.8 kg] 152.8 kg (05/07 0625) Weight change:  Last BM Date: 07/12/19  PE: Morbidly obese, lying on her left lateral side of the bed GENERAL: Not in distress,Mild pallor, no obvious icterus ABDOMEN: Soft, mild epigastric tenderness, normoactive bowel sounds EXTREMITIES: No edema  Lab Results: Results for orders placed or performed during the hospital encounter of 07/09/19 (from the past 48 hour(s))  Glucose, capillary     Status: Abnormal   Collection Time: 07/11/19  4:29 PM  Result Value Ref Range   Glucose-Capillary 271 (H) 70 - 99 mg/dL    Comment: Glucose reference range applies only to samples taken after fasting for at least 8 hours.  Glucose, capillary     Status: Abnormal   Collection Time: 07/11/19  9:29 PM  Result Value Ref Range   Glucose-Capillary 281 (H) 70 - 99 mg/dL    Comment: Glucose reference range applies only to samples taken after fasting for at least 8 hours.  CBC with Differential/Platelet     Status: Abnormal   Collection Time: 07/12/19  4:57 AM  Result Value Ref Range   WBC 15.3 (H) 4.0 - 10.5 K/uL   RBC 2.65 (L) 3.87 - 5.11 MIL/uL   Hemoglobin 7.1 (L) 12.0 - 15.0 g/dL   HCT 24.0 (L) 36.0 - 46.0 %   MCV 90.6 80.0 - 100.0 fL   MCH 26.8 26.0 - 34.0 pg   MCHC 29.6 (L) 30.0 - 36.0 g/dL   RDW 15.0 11.5 - 15.5 %   Platelets 374 150 - 400 K/uL   nRBC 0.0 0.0 - 0.2 %    Neutrophils Relative % 81 %   Neutro Abs 12.5 (H) 1.7 - 7.7 K/uL   Lymphocytes Relative 11 %   Lymphs Abs 1.7 0.7 - 4.0 K/uL   Monocytes Relative 4 %   Monocytes Absolute 0.6 0.1 - 1.0 K/uL   Eosinophils Relative 3 %   Eosinophils Absolute 0.4 0.0 - 0.5 K/uL   Basophils Relative 0 %   Basophils Absolute 0.1 0.0 - 0.1 K/uL   Immature Granulocytes 1 %   Abs Immature Granulocytes 0.09 (H) 0.00 - 0.07 K/uL    Comment: Performed at [facility], [place] [address] [place], [place] [address]  Comprehensive metabolic panel     Status: Abnormal   Collection Time: 07/12/19  4:57 AM  Result Value Ref Range   Sodium 135 135 - 145 mmol/L   Potassium 3.7 3.5 - 5.1 mmol/L   Chloride 99 98 - 111 mmol/L   CO2 26 22 - 32 mmol/L   Glucose, Bld 234 (H) 70 - 99 mg/dL    Comment: Glucose reference range applies only to samples taken after fasting for at least 8 hours.   BUN 21 (H) 6 - 20 mg/dL   Creatinine, Ser 1.27 (H) 0.44 - 1.00 mg/dL   Calcium 7.8 (L) 8.9 -  10.3 mg/dL   Total Protein 6.2 (L) 6.5 - 8.1 g/dL   Albumin 2.7 (L) 3.5 - 5.0 g/dL   AST 10 (L) 15 - 41 U/L   ALT 8 0 - 44 U/L   Alkaline Phosphatase 84 38 - 126 U/L   Total Bilirubin 0.6 0.3 - 1.2 mg/dL   GFR calc non Af Amer 47 (L) >60 mL/min   GFR calc Af Amer 55 (L) >60 mL/min   Anion gap 10 5 - 15    Comment: Performed at [facility], [place] [address], [place], [place] [address]  Phosphorus     Status: None   Collection Time: 07/12/19  4:57 AM  Result Value Ref Range   Phosphorus 2.6 2.5 - 4.6 mg/dL    Comment: Performed at [facility], [place] [address], [place], [place] [address]  Magnesium     Status: None   Collection Time: 07/12/19  4:57 AM  Result Value Ref Range   Magnesium 1.9 1.7 - 2.4 mg/dL    Comment: Performed at [facility], [place] [address] [place], [name] [address]  Glucose, capillary     Status: Abnormal   Collection Time: 07/12/19  7:57 AM   Result Value Ref Range   Glucose-Capillary 200 (H) 70 - 99 mg/dL    Comment: Glucose reference range applies only to samples taken after fasting for at least 8 hours.  Troponin I (High Sensitivity)     Status: Abnormal   Collection Time: 07/12/19  9:41 AM  Result Value Ref Range   Troponin I (High Sensitivity) 26 (H) <18 ng/L    Comment: (NOTE) Elevated high sensitivity troponin I (hsTnI) values and significant  changes across serial measurements may suggest ACS but many other  chronic and acute conditions are known to elevate hsTnI results.  Refer to the "Links" section for chest pain algorithms and additional  guidance. Performed at [facility], [place] [address], [place], [place] [address]   Troponin I (High Sensitivity)     Status: Abnormal   Collection Time: 07/12/19 11:21 AM  Result Value Ref Range   Troponin I (High Sensitivity) 22 (H) <18 ng/L    Comment: (NOTE) Elevated high sensitivity troponin I (hsTnI) values and significant  changes across serial measurements may suggest ACS but many other  chronic and acute conditions are known to elevate hsTnI results.  Refer to the "Links" section for chest pain algorithms and additional  guidance. Performed at [facility], [place] [address], [place], [place] [address]   Glucose, capillary     Status: Abnormal   Collection Time: 07/12/19 11:38 AM  Result Value Ref Range   Glucose-Capillary 247 (H) 70 - 99 mg/dL    Comment: Glucose reference range applies only to samples taken after fasting for at least 8 hours.  Glucose, capillary     Status: Abnormal   Collection Time: 07/12/19  4:34 PM  Result Value Ref Range   Glucose-Capillary 263 (H) 70 - 99 mg/dL    Comment: Glucose reference range applies only to samples taken after fasting for at least 8 hours.  Glucose, capillary     Status: Abnormal   Collection Time: 07/12/19  8:48 PM  Result Value Ref Range   Glucose-Capillary 202 (H) 70 - 99  mg/dL    Comment: Glucose reference range applies only to samples taken after fasting for at least 8 hours.  CBC with Differential/Platelet     Status: Abnormal  Collection Time: 07/13/19  4:36 AM  Result Value Ref Range   WBC 10.1 4.0 - 10.5 K/uL   RBC 2.61 (L) 3.87 - 5.11 MIL/uL   Hemoglobin 7.0 (L) 12.0 - 15.0 g/dL   HCT 23.5 (L) 36.0 - 46.0 %   MCV 90.0 80.0 - 100.0 fL   MCH 26.8 26.0 - 34.0 pg   MCHC 29.8 (L) 30.0 - 36.0 g/dL   RDW 14.6 11.5 - 15.5 %   Platelets 361 150 - 400 K/uL   nRBC 0.0 0.0 - 0.2 %   Neutrophils Relative % 64 %   Neutro Abs 6.6 1.7 - 7.7 K/uL   Lymphocytes Relative 25 %   Lymphs Abs 2.6 0.7 - 4.0 K/uL   Monocytes Relative 6 %   Monocytes Absolute 0.6 0.1 - 1.0 K/uL   Eosinophils Relative 3 %   Eosinophils Absolute 0.3 0.0 - 0.5 K/uL   Basophils Relative 1 %   Basophils Absolute 0.1 0.0 - 0.1 K/uL   Immature Granulocytes 1 %   Abs Immature Granulocytes 0.06 0.00 - 0.07 K/uL    Comment: Performed at [facility], [place] [address] [place], [place] [address]  Comprehensive metabolic panel     Status: Abnormal   Collection Time: 07/13/19  4:36 AM  Result Value Ref Range   Sodium 136 135 - 145 mmol/L   Potassium 3.8 3.5 - 5.1 mmol/L   Chloride 102 98 - 111 mmol/L   CO2 26 22 - 32 mmol/L   Glucose, Bld 194 (H) 70 - 99 mg/dL    Comment: Glucose reference range applies only to samples taken after fasting for at least 8 hours.   BUN 23 (H) 6 - 20 mg/dL   Creatinine, Ser 1.53 (H) 0.44 - 1.00 mg/dL   Calcium 8.6 (L) 8.9 - 10.3 mg/dL   Total Protein 6.2 (L) 6.5 - 8.1 g/dL   Albumin 2.6 (L) 3.5 - 5.0 g/dL   AST 8 (L) 15 - 41 U/L   ALT 9 0 - 44 U/L   Alkaline Phosphatase 75 38 - 126 U/L   Total Bilirubin 0.6 0.3 - 1.2 mg/dL   GFR calc non Af Amer 38 (L) >60 mL/min   GFR calc Af Amer 44 (L) >60 mL/min   Anion gap 8 5 - 15    Comment: Performed at [facility], [place] [address] [place], [place] [address]  Phosphorus      Status: None   Collection Time: 07/13/19  4:36 AM  Result Value Ref Range   Phosphorus 3.2 2.5 - 4.6 mg/dL    Comment: Performed at [facility], [place] [address] [place], [place] [address]  Magnesium     Status: None   Collection Time: 07/13/19  4:36 AM  Result Value Ref Range   Magnesium 2.0 1.7 - 2.4 mg/dL    Comment: Performed at [facility], [place] [address], [place], [place] [address]  Glucose, capillary     Status: Abnormal   Collection Time: 07/13/19  7:53 AM  Result Value Ref Range   Glucose-Capillary 185 (H) 70 - 99 mg/dL    Comment: Glucose reference range applies only to samples taken after fasting for at least 8 hours.  Glucose, capillary     Status: Abnormal   Collection Time: 07/13/19 12:27 PM  Result Value Ref Range   Glucose-Capillary 158 (H) 70 - 99 mg/dL    Comment: Glucose reference range applies only to samples  taken after fasting for at least 8 hours.    Studies/Results: No results found.  Medications: I have reviewed the patient's current medications.  Assessment: Diabetic gastroparesis Intermittent nausea and vomiting Wants to avoid Reglan due to associated side effects  Blood sugar 194 today morning Renal impairment BUN 23, creatinine 1.53, GFR 44  Plan: Recommend low fiber, low residue diet, small frequent meals Continue milligrams 3 times a day with meals and at bedtime Recommend avoiding narcotics. Continue pantoprazole 40 mg twice daily On Phenergan 25 mg IV every 6 hours as needed Continue supportive management  Ronnette Juniper, MD 07/13/2019, 1:22 PM

## 2019-07-13 NOTE — Chronic Care Management (AMB) (Signed)
  Chronic Care Management   Note  07/13/2019 Name: Deborah [name] MRN: [id number] DOB: [birth year]  RNCM was scheduled to call the patient today and cancelled the appointment notified that the patient was admitted to the hospital on 07/09/19 for Non-intractable vomiting with nausea.   Follow up plan: RNCM will continue to monitor the progress of the patient  Lazaro Arms RN, BSN, [facility] Care Management Coordinator [place] Phone: [phone] Fax: [phone]

## 2019-07-14 LAB — CBC WITH DIFFERENTIAL/PLATELET
Abs Immature Granulocytes: 0.04 10*3/uL (ref 0.00–0.07)
Basophils Absolute: 0.1 10*3/uL (ref 0.0–0.1)
Basophils Relative: 1 %
Eosinophils Absolute: 0.3 10*3/uL (ref 0.0–0.5)
Eosinophils Relative: 3 %
HCT: 24.2 % — ABNORMAL LOW (ref 36.0–46.0)
Hemoglobin: 7.2 g/dL — ABNORMAL LOW (ref 12.0–15.0)
Immature Granulocytes: 1 %
Lymphocytes Relative: 24 %
Lymphs Abs: 2.1 10*3/uL (ref 0.7–4.0)
MCH: 26.8 pg (ref 26.0–34.0)
MCHC: 29.8 g/dL — ABNORMAL LOW (ref 30.0–36.0)
MCV: 90 fL (ref 80.0–100.0)
Monocytes Absolute: 0.6 10*3/uL (ref 0.1–1.0)
Monocytes Relative: 7 %
Neutro Abs: 5.8 10*3/uL (ref 1.7–7.7)
Neutrophils Relative %: 64 %
Platelets: 403 10*3/uL — ABNORMAL HIGH (ref 150–400)
RBC: 2.69 MIL/uL — ABNORMAL LOW (ref 3.87–5.11)
RDW: 14.8 % (ref 11.5–15.5)
WBC: 8.9 10*3/uL (ref 4.0–10.5)
nRBC: 0 % (ref 0.0–0.2)

## 2019-07-14 LAB — COMPREHENSIVE METABOLIC PANEL
ALT: 10 U/L (ref 0–44)
AST: 10 U/L — ABNORMAL LOW (ref 15–41)
Albumin: 2.7 g/dL — ABNORMAL LOW (ref 3.5–5.0)
Alkaline Phosphatase: 78 U/L (ref 38–126)
Anion gap: 9 (ref 5–15)
BUN: 19 mg/dL (ref 6–20)
CO2: 27 mmol/L (ref 22–32)
Calcium: 8.8 mg/dL — ABNORMAL LOW (ref 8.9–10.3)
Chloride: 103 mmol/L (ref 98–111)
Creatinine, Ser: 1.17 mg/dL — ABNORMAL HIGH (ref 0.44–1.00)
GFR calc Af Amer: >60 mL/min (ref >60)
GFR calc non Af Amer: 52 mL/min — ABNORMAL LOW (ref >60)
Glucose, Bld: 212 mg/dL — ABNORMAL HIGH (ref 70–99)
Potassium: 3.7 mmol/L (ref 3.5–5.1)
Sodium: 139 mmol/L (ref 135–145)
Total Bilirubin: 0.6 mg/dL (ref 0.3–1.2)
Total Protein: 6.3 g/dL — ABNORMAL LOW (ref 6.5–8.1)

## 2019-07-14 LAB — GLUCOSE, CAPILLARY
Glucose-Capillary: 185 mg/dL — ABNORMAL HIGH (ref 70–99)
Glucose-Capillary: 206 mg/dL — ABNORMAL HIGH (ref 70–99)
Glucose-Capillary: 214 mg/dL — ABNORMAL HIGH (ref 70–99)
Glucose-Capillary: 230 mg/dL — ABNORMAL HIGH (ref 70–99)

## 2019-07-14 LAB — T4, FREE: Free T4: 1.21 ng/dL — ABNORMAL HIGH (ref 0.61–1.12)

## 2019-07-14 LAB — MAGNESIUM: Magnesium: 1.7 mg/dL (ref 1.7–2.4)

## 2019-07-14 LAB — PHOSPHORUS: Phosphorus: 2.6 mg/dL (ref 2.5–4.6)

## 2019-07-14 LAB — TSH: TSH: 1.546 u[IU]/mL (ref 0.350–4.500)

## 2019-07-14 MED ORDER — HYDROMORPHONE HCL 1 MG/ML IJ SOLN
0.5000 mg | Freq: Three times a day (TID) | INTRAMUSCULAR | Status: DC | PRN
  Administered 2019-07-14 – 2019-07-15 (×3): 0.5 mg via INTRAVENOUS
  Filled 2019-07-14 (×3): qty 0.5

## 2019-07-14 MED ORDER — MAGNESIUM SULFATE 2 GM/50ML IV SOLN
2.0000 g | Freq: Once | INTRAVENOUS | Status: AC
  Administered 2019-07-14: 2 g via INTRAVENOUS
  Filled 2019-07-14: qty 50

## 2019-07-14 NOTE — Progress Notes (Signed)
OT Cancellation Note  Patient Details Name: Deborah [name] MRN: [id number] DOB: 22-Mar-1965   Cancelled Treatment:    Reason Eval/Treat Not Completed: OT screened, no needs identified, will sign off(Patient reports she has no needs and "Don't need that" in regards to OT services. Patient reports she can perform her ADLs and her mother and brother assist her with everything she needs. OT will sign off as per patient's wishes. Please re-consult if patient status changes.  Kenrick Pore L Beatris Belen 07/14/2019, 3:36 PM

## 2019-07-14 NOTE — Evaluation (Signed)
Physical Therapy Evaluation Patient Details Name: Deborah [name] MRN: [id number] DOB: [birth year] Today's Date: 07/14/2019   History of Present Illness  Pt is 54 yo female admitted to ED with nausea and vomiting, possible diabetric gastroparesis, chest pain. PMH includes CHF, CVA, afib, HTN, HLD, gout, DM with history of DKA, obesity, and GERD.  Clinical Impression   Pt presents with generalized weakness, difficulty ambulating at baseline, and LE pain. Prior to admission, pt was walking very short distances only, was mostly at transfer level to and from wheelchair and was independent with daily tasks. Pt demonstrated stand and pivot transfer this day with min guard for pt safety, but required no physical assist to perform. Pt agreeable to only this level of mobility today, which is her baseline. Per pt, she is not interested in PT coming to see her again while acute and does not want PT follow up. PT to sign off, thank you for the consult.     Follow Up Recommendations No PT follow up(per pt request)    Equipment Recommendations  None recommended by PT    Recommendations for Other Services       Precautions / Restrictions Precautions Precautions: Fall Restrictions Weight Bearing Restrictions: No      Mobility  Bed Mobility               General bed mobility comments: up in reliner  Transfers Overall transfer level: Needs assistance Equipment used: None Transfers: Sit to/from [employer] Transfers Sit to Stand: Min guard Stand pivot transfers: Min guard       General transfer comment: Min guard for safety, increased time to perform + slow to rise. Slightly unsteady with stand pivot but no physical assist or AD required.  Ambulation/Gait             General Gait Details: Pt declines  Stairs            Wheelchair Mobility    Modified Rankin (Stroke Patients Only)       Balance Overall balance assessment: Needs assistance Sitting-balance  support: No upper extremity supported Sitting balance-Leahy Scale: Good     Standing balance support: Bilateral upper extremity supported Standing balance-Leahy Scale: Fair Standing balance comment: able to stand and pivot without external assist, mildly unsteady                             Pertinent Vitals/Pain Pain Assessment: Faces Faces Pain Scale: Hurts little more Pain Location: my legs Pain Descriptors / Indicators: Discomfort;Sore Pain Intervention(s): Limited activity within patient's tolerance;Monitored during session;Repositioned    Home Living Family/patient expects to be discharged to:: Private residence Living Arrangements: Alone Available Help at Discharge: Family;Available PRN/intermittently(mom, brother live close by) Type of Home: Apartment Home Access: Stairs to enter Entrance Stairs-Rails: Psychiatric nurse of Steps: 1 Home Layout: One level Home Equipment: Bedside commode;Shower seat;Wheelchair - [employer] - 2 wheels;Tub bench;Walker - 4 wheels      Prior Function Level of Independence: Needs assistance   Gait / Transfers Assistance Needed: pt reports she uses wheelchair mostly, but can stand pivot independently with no AD  ADL's / Homemaking Assistance Needed: independent ADLs, can get assist from mother and brother as needed        Hand Dominance   Dominant Hand: Right    Extremity/Trunk Assessment   Upper Extremity Assessment Upper Extremity Assessment: Defer to OT evaluation    Lower Extremity Assessment Lower Extremity Assessment:  Generalized weakness    Cervical / Trunk Assessment Cervical / Trunk Assessment: Normal  Communication   Communication: No difficulties  Cognition Arousal/Alertness: Awake/alert Behavior During Therapy: Flat affect Overall Cognitive Status: Within Functional Limits for tasks assessed                                        General Comments      Exercises      Assessment/Plan    PT Assessment Patent does not need any further PT services  PT Problem List         PT Treatment Interventions      PT Goals (Current goals can be found in the Care Plan section)  Acute Rehab PT Goals Patient Stated Goal: go home PT Goal Formulation: With patient Time For Goal Achievement: 07/14/19 Potential to Achieve Goals: Good    Frequency     Barriers to discharge        Co-evaluation               AM-PAC PT "6 Clicks" Mobility  Outcome Measure Help needed turning from your back to your side while in a flat bed without using bedrails?: A Little Help needed moving from lying on your back to sitting on the side of a flat bed without using bedrails?: A Little Help needed moving to and from a bed to a chair (including a wheelchair)?: A Little Help needed standing up from a chair using your arms (e.g., wheelchair or bedside chair)?: None Help needed to walk in hospital room?: A Lot Help needed climbing 3-5 steps with a railing? : A Lot 6 Click Score: 17    End of Session   Activity Tolerance: Patient limited by fatigue;Patient tolerated treatment well;Patient limited by pain Patient left: in chair;with call bell/phone within reach Nurse Communication: Mobility status PT Visit Diagnosis: Other abnormalities of gait and mobility (R26.89)    Time: 6045-4098 PT Time Calculation (min) (ACUTE ONLY): 12 min   Charges:   PT Evaluation $PT Eval Low Complexity: 1 Low        Kambrey Hagger E, PT Acute Rehabilitation Services Pager [phone]  Office [phone]   Natnael Biederman D Elonda Husky 07/14/2019, 12:55 PM

## 2019-07-14 NOTE — Progress Notes (Signed)
Subjective: Patient was lying on bed as usual and states she was able to tolerate a hamburger with small amount of vomiting yesterday. She does not want to be on clear a full liquid diet. She feels that she wants to continue solids so she can train herself to eat similar kind of food at home. Abdominal pain has improved a bit but she continues to have loose stools 1-2 times a day.  Objective: Vital signs in last 24 hours: Temp:  [97.5 F (36.4 C)-98.1 F (36.7 C)] 97.5 F (36.4 C) (05/08 0508) Pulse Rate:  [80-92] 92 (05/08 0508) Resp:  [18-20] 20 (05/08 0508) BP: (117-139)/(50-86) 139/75 (05/08 0508) SpO2:  [91 %-96 %] 91 % (05/08 0508) Weight:  [150.8 kg] 150.8 kg (05/08 0532) Weight change: -1.996 kg Last BM Date: 07/12/19  PE: Morbidly obese, not in distress GENERAL: Lying comfortably on bed, moist mucous membrane ABDOMEN: Mild epigastric tenderness, nondistended, normoactive bowel sounds EXTREMITIES: No deformity  Lab Results: Results for orders placed or performed during the hospital encounter of 07/09/19 (from the past 48 hour(s))  Troponin I (High Sensitivity)     Status: Abnormal   Collection Time: 07/12/19 11:21 AM  Result Value Ref Range   Troponin I (High Sensitivity) 22 (H) <18 ng/L    Comment: (NOTE) Elevated high sensitivity troponin I (hsTnI) values and significant  changes across serial measurements may suggest ACS but many other  chronic and acute conditions are known to elevate hsTnI results.  Refer to the "Links" section for chest pain algorithms and additional  guidance. Performed at [facility], [place] [address] [place], [place] [address]   Glucose, capillary     Status: Abnormal   Collection Time: 07/12/19 11:38 AM  Result Value Ref Range   Glucose-Capillary 247 (H) 70 - 99 mg/dL    Comment: Glucose reference range applies only to samples taken after fasting for at least 8 hours.  Glucose, capillary     Status: Abnormal   Collection Time: 07/12/19  4:34 PM  Result Value Ref Range   Glucose-Capillary 263 (H) 70 - 99 mg/dL    Comment: Glucose reference range applies only to samples taken after fasting for at least 8 hours.  Glucose, capillary     Status: Abnormal   Collection Time: 07/12/19  8:48 PM  Result Value Ref Range   Glucose-Capillary 202 (H) 70 - 99 mg/dL    Comment: Glucose reference range applies only to samples taken after fasting for at least 8 hours.  CBC with Differential/Platelet     Status: Abnormal   Collection Time: 07/13/19  4:36 AM  Result Value Ref Range   WBC 10.1 4.0 - 10.5 K/uL   RBC 2.61 (L) 3.87 - 5.11 MIL/uL   Hemoglobin 7.0 (L) 12.0 - 15.0 g/dL   HCT 23.5 (L) 36.0 - 46.0 %   MCV 90.0 80.0 - 100.0 fL   MCH 26.8 26.0 - 34.0 pg   MCHC 29.8 (L) 30.0 - 36.0 g/dL   RDW 14.6 11.5 - 15.5 %   Platelets 361 150 - 400 K/uL   nRBC 0.0 0.0 - 0.2 %   Neutrophils Relative % 64 %   Neutro Abs 6.6 1.7 - 7.7 K/uL   Lymphocytes Relative 25 %   Lymphs Abs 2.6 0.7 - 4.0 K/uL   Monocytes Relative 6 %   Monocytes Absolute 0.6 0.1 - 1.0 K/uL   Eosinophils Relative 3 %   Eosinophils Absolute 0.3 0.0 - 0.5 K/uL  Basophils Relative 1 %   Basophils Absolute 0.1 0.0 - 0.1 K/uL   Immature Granulocytes 1 %   Abs Immature Granulocytes 0.06 0.00 - 0.07 K/uL    Comment: Performed at [facility], [place] [address], [place], [place] [address]  Comprehensive metabolic panel     Status: Abnormal   Collection Time: 07/13/19  4:36 AM  Result Value Ref Range   Sodium 136 135 - 145 mmol/L   Potassium 3.8 3.5 - 5.1 mmol/L   Chloride 102 98 - 111 mmol/L   CO2 26 22 - 32 mmol/L   Glucose, Bld 194 (H) 70 - 99 mg/dL    Comment: Glucose reference range applies only to samples taken after fasting for at least 8 hours.   BUN 23 (H) 6 - 20 mg/dL   Creatinine, Ser 1.53 (H) 0.44 - 1.00 mg/dL   Calcium 8.6 (L) 8.9 - 10.3 mg/dL   Total Protein 6.2 (L) 6.5 - 8.1 g/dL   Albumin 2.6 (L) 3.5 - 5.0  g/dL   AST 8 (L) 15 - 41 U/L   ALT 9 0 - 44 U/L   Alkaline Phosphatase 75 38 - 126 U/L   Total Bilirubin 0.6 0.3 - 1.2 mg/dL   GFR calc non Af Amer 38 (L) >60 mL/min   GFR calc Af Amer 44 (L) >60 mL/min   Anion gap 8 5 - 15    Comment: Performed at [facility], [place] [address] [place], [place] [address]  Phosphorus     Status: None   Collection Time: 07/13/19  4:36 AM  Result Value Ref Range   Phosphorus 3.2 2.5 - 4.6 mg/dL    Comment: Performed at [facility], [place] [address], [place], [place] [address]  Magnesium     Status: None   Collection Time: 07/13/19  4:36 AM  Result Value Ref Range   Magnesium 2.0 1.7 - 2.4 mg/dL    Comment: Performed at [facility], [place] [address] [place], [place] [address]  Glucose, capillary     Status: Abnormal   Collection Time: 07/13/19  7:53 AM  Result Value Ref Range   Glucose-Capillary 185 (H) 70 - 99 mg/dL    Comment: Glucose reference range applies only to samples taken after fasting for at least 8 hours.  Glucose, capillary     Status: Abnormal   Collection Time: 07/13/19 12:27 PM  Result Value Ref Range   Glucose-Capillary 158 (H) 70 - 99 mg/dL    Comment: Glucose reference range applies only to samples taken after fasting for at least 8 hours.  Glucose, capillary     Status: Abnormal   Collection Time: 07/13/19  8:19 PM  Result Value Ref Range   Glucose-Capillary 237 (H) 70 - 99 mg/dL    Comment: Glucose reference range applies only to samples taken after fasting for at least 8 hours.  CBC with Differential/Platelet     Status: Abnormal   Collection Time: 07/14/19  5:47 AM  Result Value Ref Range   WBC 8.9 4.0 - 10.5 K/uL   RBC 2.69 (L) 3.87 - 5.11 MIL/uL   Hemoglobin 7.2 (L) 12.0 - 15.0 g/dL   HCT 24.2 (L) 36.0 - 46.0 %   MCV 90.0 80.0 - 100.0 fL   MCH 26.8 26.0 - 34.0 pg   MCHC 29.8 (L) 30.0 - 36.0 g/dL   RDW 14.8 11.5 - 15.5 %   Platelets 403 (H) 150 - 400 K/uL  nRBC  0.0 0.0 - 0.2 %   Neutrophils Relative % 64 %   Neutro Abs 5.8 1.7 - 7.7 K/uL   Lymphocytes Relative 24 %   Lymphs Abs 2.1 0.7 - 4.0 K/uL   Monocytes Relative 7 %   Monocytes Absolute 0.6 0.1 - 1.0 K/uL   Eosinophils Relative 3 %   Eosinophils Absolute 0.3 0.0 - 0.5 K/uL   Basophils Relative 1 %   Basophils Absolute 0.1 0.0 - 0.1 K/uL   Immature Granulocytes 1 %   Abs Immature Granulocytes 0.04 0.00 - 0.07 K/uL    Comment: Performed at [facility], [place] [address] [place], [place] [address]  Comprehensive metabolic panel     Status: Abnormal   Collection Time: 07/14/19  5:47 AM  Result Value Ref Range   Sodium 139 135 - 145 mmol/L   Potassium 3.7 3.5 - 5.1 mmol/L   Chloride 103 98 - 111 mmol/L   CO2 27 22 - 32 mmol/L   Glucose, Bld 212 (H) 70 - 99 mg/dL    Comment: Glucose reference range applies only to samples taken after fasting for at least 8 hours.   BUN 19 6 - 20 mg/dL   Creatinine, Ser 1.17 (H) 0.44 - 1.00 mg/dL   Calcium 8.8 (L) 8.9 - 10.3 mg/dL   Total Protein 6.3 (L) 6.5 - 8.1 g/dL   Albumin 2.7 (L) 3.5 - 5.0 g/dL   AST 10 (L) 15 - 41 U/L   ALT 10 0 - 44 U/L   Alkaline Phosphatase 78 38 - 126 U/L   Total Bilirubin 0.6 0.3 - 1.2 mg/dL   GFR calc non Af Amer 52 (L) >60 mL/min   GFR calc Af Amer >60 >60 mL/min   Anion gap 9 5 - 15    Comment: Performed at [facility], [place] [address], [place], [place] [address]  Magnesium     Status: None   Collection Time: 07/14/19  5:47 AM  Result Value Ref Range   Magnesium 1.7 1.7 - 2.4 mg/dL    Comment: Performed at [facility], [place] [address], [place], [place] [address]  Phosphorus     Status: None   Collection Time: 07/14/19  5:47 AM  Result Value Ref Range   Phosphorus 2.6 2.5 - 4.6 mg/dL    Comment: Performed at [facility], [place] [address] [place], [place] [address]  Glucose, capillary     Status: Abnormal   Collection Time: 07/14/19  7:38 AM   Result Value Ref Range   Glucose-Capillary 185 (H) 70 - 99 mg/dL    Comment: Glucose reference range applies only to samples taken after fasting for at least 8 hours.    Studies/Results: No results found.  Medications: I have reviewed the patient's current medications.  Assessment: Diabetic gastroparesis with intermittent nausea and vomiting-blood sugar 212 Doing the same as the last few days Normocytic anemia, hemoglobin 7.2, MCV 90, mild thrombocytosis of 403 Improved renal function, GFR more than 60, creatinine 1.17, BUN 19  Plan: I recommended a nutritional evaluation so patient can learn what kind of foods qualify as low fiber and low residue. However patient states she has the list at home, and she wants to have burgers and chicken, but is willing to have smaller portions. Discussed with patient that food with low fiber and more liquid consistency would be preferred in with gastroparesis. She is not willing to increase her dose of erythromycin and  is currently on 250 mg 3 times daily with meals and bedtime.   Ronnette Juniper, MD 07/14/2019, 9:46 AM

## 2019-07-14 NOTE — Plan of Care (Signed)
Nutrition Education Note  RD consulted for nutrition education regarding low fiber, low residue diet for management of diabetic gastroparesis    RD spoke with patient via phone this afternoon. Patient reports feeling tired and does not engage in conversation. She reports improved abdominal pain, denies vomiting, and endorsed nausea after eating eggs with bagel this morning for breakfast. She reports usual intake of 3 meals/day at home, does not provide dietary recall and states "just eating whatever is in the fridge". She reports that she bakes her chicken and is aware of low fiber foods. RD informed patient that educational material along with sample menus have been attached to discharge instructions.  RD provided "Fiber Restricted Nutrition Therapy" handout as well as "5 Sample Menus for Gradually Increasing Fiber" handout from the [employer] of [employer]. Handout explains reasons for pt to follow a low fiber. Reviews low fiber foods and high fiber foods and discusses ways to gradually increase fiber in the diet.   Pt verbalizes understanding of information provided.   Expect poor compliance.  Body mass index is Body mass index is 53.67 kg/m.[name] Pt meets criteria for morbid obesity based on current BMI.  Current diet order is soft, patient is consuming approximately 100% of meals at this time. Labs and medications reviewed. No further nutrition interventions warranted at this time. RD contact information provided. If additional nutrition issues arise, please re-consult RD.  Lajuan Lines, RD, LDN Clinical Nutrition After Hours/Weekend Pager # [phone]

## 2019-07-14 NOTE — Progress Notes (Signed)
PROGRESS NOTE    Deborah [name]  TIW:[id number] DOB: [birth year] DOA: 07/09/2019 PCP: [name], DO  Brief Narrative:  HPI per Dr. Shela Leff on 07/10/19 Deborah [name] is a 54 y.o. female with medical history significant of chronic systolic CHF (EF 40 to 25% on echo done 01/2019), paroxysmal A. fib on Eliquis, insulin-dependent type 2 diabetes with history of recurrent admissions for DKA (most recent 4/23-4/28), CKD stage III, hypertension, hyperlipidemia, CVA, gout, diabetic gastroparesis intolerant to Reglan, gastric ulcer, GERD, anemia of chronic disease, chronic pain, depression, morbid obesity (BMI 54.07) presenting to the ED via EMS with complaints of nausea and vomiting.  Patient states yesterday afternoon she had a ham sandwich and soon after started vomiting.  She also had some substernal sharp chest pain and shortness of breath at that time.  She has continued to have 10 out of 10 intensity generalized abdominal pain since then.  Also continue to have some chest pain since then.  She has continued to vomit and has not been able to tolerate any p.o. intake.  Does report having a normal bowel movement in the afternoon.  She does not recall taking erythromycin since her recent hospital discharge.  States her blood glucose has been in the 200s at home.  ED Course: Afebrile.  Tachycardic and tachypneic on arrival.  Not hypoxic.  WBC count 18.6.  Lactic acid 3.2.  Hemoglobin 7.8, stable compared to labs done during recent hospitalization.  Platelet count 594, previously normal.  Potassium 2.6.  Magnesium level 0.7.  Bicarb 25, anion gap 17.  Blood glucose 119.  BUN 23, creatinine 1.5.  Creatinine was 1.1 at the time of recent hospital discharge.  Lipase and LFTs normal.  UA pending.  Blood ethanol level undetectable.  Blood culture x2 pending.  UDS pending.  SARS-CoV-2 PCR test negative.  Influenza panel negative. No imaging done. Patient received Dilaudid, 1 L LR bolus, IV  magnesium 2 g, IV potassium supplementation, and Zofran.  **Interim History Continues to have some nausea vomiting and abdominal pain so her diet was slowly advanced but she has not been very compliant with her diet.  Gastroenterology was consulted for further evaluation recommendations and she states that she ate half of her burger the day before yesterday but continues to have some nausea and vomiting and states that she is having some loose watery bowel movements..  She continues to have small amount of emesis and GI continues to recommend low fiber, low residue diet and small frequent meals.    GI recommended some Reglan but patient wants avoid the side effects of tardive dyskinesia and tremors.  We will continue to monitor patient's diet tolerance but it appears that she has been not compliant with her recommendations.  The day before yesterday she also had some chest discomfort that radiated into her shoulders but thinks it may be from the way that she slept okay sitting up has made it better.  GI today recommends a nutritionist consultation and recommends avoiding narcotic usage and we have tried to wean this back today and cut her back from 1 mg of Dilaudid every 4 hours to half a milligram every 8 hours with the hopes of getting her off of it any narcotic medications.  Dietitian spoke with the patient but patient verbalized understanding and did not really want to talk and so we will expect full compliance with her diet education.  Assessment & Plan:   Principal Problem:   Intractable nausea and vomiting Active  Problems:   Hypokalemia   Leukocytosis   Abdominal pain   Lactic acidosis  Abdominal pain, intractable nausea and vomiting in the setting of Diabetic Gastroparesis  -Patient presenting with acute onset nausea/vomiting and abdominal pain.  Suspicion for diabetic gastroparesis.   -Patient is afebrile, but had elevated WBC count of 16.2 and a lactic acid of 3.2 on admission; now WBC  is 8.9 -Lipase and LFTs within normal limits.   -CT Abd/Pelvis showed "No acute finding including bowel obstruction or visible inflammation.  Chronic findings are stable from CT 1 month ago and described above." -Previous gastric emptying study 2018 that was unrevealing -N.p.o initially but now on a CLD and will attempt Soft Diet and she is now on a soft diet but has not been compliant with a low fiber, low residue diet -NS at 75 mL's per hour now stopped  -Started on Erythromycin 50 mg p.o. 3 times daily with meals and nightly and will continue -C/w Supportive care, Antiemetics with Promethazine 25 mg IV q6hprn Nausea and Vomiting  -C/w PPI with Pantoprazole 40 mg po BID  -Judicious use of Narcotic Medications; Currently has Hydromorphone 1 mg IV q4hprn Severe Pain and we have reduced this to half a milligram every 8 hours as needed for severe pain -Consulted gastroenterology for further evaluation and recommendations appreciate their evaluation -GI recommended some recommended above patient wants to avoid it due to the side effect of tardive dyskinesia and tremors.  The day before yesterday she is having her first solid meal since admission but she continues to have a small amount of emesis and has been nauseous and has not been compliant with her diet.  Gastroenterology recommending continuing low fiber, low residue diet and small frequent meals but patient does not want this.  She had scrambled eggs this morning but has been seen to order high carb meals; dietitian was consulted for diet education and will continue to try and control her blood sugars carefully and avoid narcotics -CBG's are better controlled on ranging from 158-237 -continue supportive management given that this is slow to improve and because she is noncompliant.;  Still complains of abdominal pain despite the fact that she wants to continue to have burgers and chicken -GI has stress and urge that food with low fiber and more  liquid consistency would be preferred with gastroparesis but patient does not want to try this and she does not want her erythema sent to be increased and is currently to 50 mg p.o. 3 times daily with meals and bedtime -She clinically has been about the same the last few days with intermittent nausea vomiting as well as abdominal pain.  Atypical Chest Pain -Improved initially but was having chest pain again and radiated into her shoulders -Initial EKG without acute ischemic changes.  High-sensitivity troponin 30 and trended down to 23 -Continue to trend troponin and that she is having it again and repeat troponin this morning was 26 and after that was 22 -Repeat EKG this AM showed a normal sinus rhythm at a rate of 94 with a QTC of 460 with no evidence of any ischemic changes on my interpretation -Replete Electrolytes -Continue to monitor on telemetry  Leukocytosis, improved -Etiology likely reactive from dehydration.   -Unlikely infectious etiology with normal procalcitonin.   -No acute findings on CT abdomen/pelvis.  -Patient's WBC is now improved to 8.9 -Urinalysis Still pending but UDS was Negative  -Blood cultures x2: NGTD at 3 days -Continue to monitor CBC daily and  continue to evaluate carefully  Lactic Acidosis -Likely secondary to dehydration.   -Unlikely infectious process.   -Procalcitonin less than 0.10. -Blood ethanol level undetectable.    -CT abdomen/pelvis negative. -Lactic Acid 3.2--> 2.1 --> 1.2 -IV fluid hydration is now stopped as she is improved and lactic acid level is now normalized  Hypokalemia -Likely related to decreased p.o. intake from intractable nausea/vomiting and home diuretic use.  -Severe hypomagnesemia also likely contributing (0.7 on admission) -K+ this AM was 3.7 -EKG without acute changes.  -Continue to hold home Torsemide -Continue to monitor electrolytes daily  Severe Hypomagnesemia -Improved.  Patient's magnesium went from 0.7 is  now 1.7 -Replete With IV mag sulfate 2 g -Continue to monitor and replete as necessary -Repeat magnesium level in the a.m.  AKI on CKD stage III:  -BUN 23, creatinine 1.5, creatinine was 1.1 at the time of recent hospital discharge.  -Suspect prerenal from dehydration and home diuretic use.  -Continue holding home diuretic with torsemide -Continued NS at 75 mL's per hour but this is now stopped -Patient's BUN/creatinine went from 20/1.11 -> 21/1.31 -> 21/1.27 -> 23/1.53  and in the setting of emesis and may need to go back on IV fluids if she continues to worsen but it is improved and is now 19/1.17 -Avoid nephrotoxic medications, contrast dyes, hypotension and renally adjust medications -Repeat CMP in a.m.  Poorly controlled Insulin-Dependent Type 2 Diabetes  -Hemoglobin A1c A1c 8.4.   -Home regimen includes Lantus 45 units subcutaneously daily, NovoLog 15 subcutaneously 3 times daily AC. -Diabetic educator following, appreciate assistance -Restart Lantus at a lower dose, 25 units of insulin daily -C/w Insulin sliding scale for coverage with moderate NovoLog/scale AC -CBG's ranging from 158-263 now  Chronic Systolic CHF -EF 40 to 82% on TTE 01/2019. -No signs of volume overload at this time. -Currently holding home torsemide and IV fluid hydration is now stopped; will need to continue monitor carefully -Continue Metoprolol Succinate 25 mg p.o. daily -Continue to monitor volume status carefully and continue to monitor for signs and symptoms of volume overload -Strict I's and O's and daily weights; She is +2.885 Liters since admission and Weight was not done and was 335  Paroxysmal Atrial Fibrilliation -Currently in sinus rhythm. Continue home Metoprolol XL 25 mg po Daily and Apixaban 5 mg po BID -Checking TSH  GERD -Continue PPI with Pantoprazole 40 mg po BID  Anemia of chronic disease -Patient's hemoglobin/hematocrit went from 8.6/20.5 is now 7.2/24.2 -Anemia panel  done on 06/26/2019 showed an iron level of 18, U IBC of 327, TIBC of 345, saturation ratios of 5%, ferritin level 48, folate level of 6.6, and vitamin B12 level of 251 -Continue to monitor for signs and symptoms of bleeding as she is anticoagulated with Apixaban; currently no overt bleeding noted -Repeat CBC in a.m.  Depression -Remained stable.  Patient maintained on home regimen of antidepressant medication with Duloxetine 40 mg po BID  Super Morbid Obesity -Estimated body mass index is 53.67 kg/m as calculated from the following:   Height as of this encounter: 5\' 6"  (1.676 m).   Weight as of this encounter: 150.8 kg. -Weight Loss and Dietary Counseling given   Thrombocytosis -Patient's platelet count went from 361 is now 403 -Continue to monitor and trend and repeat CBC in the AM  DVT prophylaxis: Anticoagulated with Apixaban 5 mg po BID  Code Status: FULL CODE  Family Communication: No family present at bedside  Disposition Plan: Patient is from home likely to  be discharged home when she is able to tolerate a diet without abdominal pain or nausea but she has not been very compliant with her diet.  Gastroenterology has been consulted given her suspected diabetic gastroparesis.  We have also consulted PT OT to further evaluate and treat and she has no follow-up  Status is: Inpatient  Remains inpatient appropriate because:Persistent severe electrolyte disturbances, Ongoing active pain requiring inpatient pain management and Inpatient level of care appropriate due to severity of illness; continues to have abdominal pain and nausea vomiting and p.o. intake has been intermittent   Dispo: The patient is from: Home              Anticipated d/c is to: Home              Anticipated d/c date is: 1 day              Patient currently is not medically stable to d/c.  And will need GI clearance prior to discharge   Consultants:   Gastroenterology    Procedures: None   Antimicrobials:    Anti-infectives (From admission, onward)   Start     Dose/Rate Route Frequency Ordered Stop   07/10/19 0800  erythromycin (E-MYCIN) tablet 250 mg     250 mg Oral 3 times daily with meals & bedtime 07/10/19 0342       Subjective: Seen and examined at bedside and states that she continues to have some nausea and abdominal pain.  States that she did vomit last night.  Still wants to try to eat.  Had a long conversation with the GI doctor who recommended compliance with the diet recommended and follow-up with a dietitian.  Dietary has been consulted.  She denies any chest pain currently.  No other concerns" this time.  Objective: Vitals:   07/13/19 2022 07/14/19 0508 07/14/19 0532 07/14/19 1400  BP: (!) 117/50 139/75  126/72  Pulse: 92 92  85  Resp: 18 20  20   Temp: 97.9 F (36.6 C) (!) 97.5 F (36.4 C)  98 F (36.7 C)  TempSrc: Oral Oral  Oral  SpO2: 93% 91%  100%  Weight:   (!) 150.8 kg   Height:        Intake/Output Summary (Last 24 hours) at 07/14/2019 1536 Last data filed at 07/14/2019 1015 Gross per 24 hour  Intake 340 ml  Output 3 ml  Net 337 ml   Filed Weights   07/09/19 2240 07/13/19 0625 07/14/19 0532  Weight: (!) 152 kg (!) 152.8 kg (!) 150.8 kg   Examination: Physical Exam:  Constitutional: WN/WD super morbidly obese African-American female currently sitting up in bed about to eat her breakfast appears currently not to be in any acute distress but she is still complaining of being uncomfortable not complain of some abdominal pain. Eyes: Lids and conjunctivae normal, sclerae anicteric; she has protuberant eyeballs ENMT: External Ears, Nose appear normal. Grossly normal hearing.  Neck: Appears normal, supple, no cervical masses, normal ROM, no appreciable thyromegaly; no JVD Respiratory: Diminished to auscultation bilaterally, no wheezing, rales, rhonchi or crackles. Normal respiratory effort and patient is not tachypenic. No accessory muscle use.  He has unlabored  breathing Cardiovascular: RRR, no murmurs / rubs / gallops. S1 and S2 auscultated.  Minimal extremity edema Abdomen: Soft, tender to palpate and distended secondary to body habitus.  Bowel sounds positive.  GU: Deferred. Musculoskeletal: No clubbing / cyanosis of digits/nails. No joint deformity upper and lower extremities.  Skin: No  rashes, lesions, ulcers on limited skin evaluation. No induration; Warm and dry.  Neurologic: CN 2-12 grossly intact with no focal deficits. Romberg sign and cerebellar reflexes not assessed.  Psychiatric: Normal judgment and insight. Alert and oriented x 3. Normal mood and appropriate affect.   Data Reviewed: I have personally reviewed following labs and imaging studies  CBC: Recent Labs  Lab 07/10/19 0454 07/11/19 0459 07/12/19 0457 07/13/19 0436 07/14/19 0547  WBC 16.2* 12.7* 15.3* 10.1 8.9  NEUTROABS  --   --  12.5* 6.6 5.8  HGB 8.6* 7.3* 7.1* 7.0* 7.2*  HCT 28.5* 24.9* 24.0* 23.5* 24.2*  MCV 89.1 90.2 90.6 90.0 90.0  PLT 452* 382 374 361 956*   Basic Metabolic Panel: Recent Labs  Lab 07/09/19 2249 07/09/19 2249 07/10/19 0454 07/11/19 0459 07/12/19 0457 07/13/19 0436 07/14/19 0547  NA 141   < > 139 140 135 136 139  K 2.6*   < > 3.4* 3.3* 3.7 3.8 3.7  CL 99   < > 101 102 99 102 103  CO2 25   < > 24 28 26 26 27   GLUCOSE 119*   < > 244* 184* 234* 194* 212*  BUN 23*   < > 20 21* 21* 23* 19  CREATININE 1.52*   < > 1.11* 1.31* 1.27* 1.53* 1.17*  CALCIUM 8.9   < > 8.0* 7.7* 7.8* 8.6* 8.8*  MG 0.7*  --   --  2.1 1.9 2.0 1.7  PHOS  --   --   --   --  2.6 3.2 2.6   < > = values in this interval not displayed.   GFR: Estimated Creatinine Clearance: 82.3 mL/min (A) (by C-G formula based on SCr of 1.17 mg/dL (H)). Liver Function Tests: Recent Labs  Lab 07/09/19 2249 07/11/19 0459 07/12/19 0457 07/13/19 0436 07/14/19 0547  AST 21 12* 10* 8* 10*  ALT 14 10 8 9 10   ALKPHOS 101 78 84 75 78  BILITOT 1.0 0.5 0.6 0.6 0.6  PROT 8.3* 6.2* 6.2*  6.2* 6.3*  ALBUMIN 3.6 2.7* 2.7* 2.6* 2.7*   Recent Labs  Lab 07/09/19 2249  LIPASE 24   No results for input(s): AMMONIA in the last 168 hours. Coagulation Profile: No results for input(s): INR, PROTIME in the last 168 hours. Cardiac Enzymes: No results for input(s): CKTOTAL, CKMB, CKMBINDEX, TROPONINI in the last 168 hours. BNP (last 3 results) No results for input(s): PROBNP in the last 8760 hours. HbA1C: No results for input(s): HGBA1C in the last 72 hours. CBG: Recent Labs  Lab 07/13/19 0753 07/13/19 1227 07/13/19 2019 07/14/19 0738 07/14/19 1156  GLUCAP 185* 158* 237* 185* 206*   Lipid Profile: No results for input(s): CHOL, HDL, LDLCALC, TRIG, CHOLHDL, LDLDIRECT in the last 72 hours. Thyroid Function Tests: Recent Labs    07/14/19 1017  TSH 1.546   Anemia Panel: No results for input(s): VITAMINB12, FOLATE, FERRITIN, TIBC, IRON, RETICCTPCT in the last 72 hours. Sepsis Labs: Recent Labs  Lab 07/10/19 0119 07/10/19 0454 07/11/19 0459  PROCALCITON  --  <0.10  --   LATICACIDVEN 3.2* 2.1* 1.2    Recent Results (from the past 240 hour(s))  Respiratory Panel by RT PCR (Flu A&B, Covid) - Nasopharyngeal Swab     Status: None   Collection Time: 07/10/19  1:19 AM   Specimen: Nasopharyngeal Swab  Result Value Ref Range Status   SARS Coronavirus 2 by RT PCR NEGATIVE NEGATIVE Final    Comment: (NOTE) SARS-CoV-2 target nucleic  acids are NOT DETECTED. The SARS-CoV-2 RNA is generally detectable in upper respiratoy specimens during the acute phase of infection. The lowest concentration of SARS-CoV-2 viral copies this assay can detect is 131 copies/mL. A negative result does not preclude SARS-Cov-2 infection and should not be used as the sole basis for treatment or other patient management decisions. A negative result may occur with  improper specimen collection/handling, submission of specimen other than nasopharyngeal swab, presence of viral mutation(s) within  the areas targeted by this assay, and inadequate number of viral copies (<131 copies/mL). A negative result must be combined with clinical observations, patient history, and epidemiological information. The expected result is Negative. Fact Sheet for Patients:  PinkCheek.be Fact Sheet for Healthcare Providers:  GravelBags.it This test is not yet ap proved or cleared by the [place] FDA and  has been authorized for detection and/or diagnosis of SARS-CoV-2 by FDA under an Emergency Use Authorization (EUA). This EUA will remain  in effect (meaning this test can be used) for the duration of the COVID-19 declaration under Section 564(b)(1) of the Act, 21 U.S.C. section 360bbb-3(b)(1), unless the authorization is terminated or revoked sooner.    Influenza A by PCR NEGATIVE NEGATIVE Final   Influenza B by PCR NEGATIVE NEGATIVE Final    Comment: (NOTE) The Xpert Xpress SARS-CoV-2/FLU/RSV assay is intended as an aid in  the diagnosis of influenza from Nasopharyngeal swab specimens and  should not be used as a sole basis for treatment. Nasal washings and  aspirates are unacceptable for Xpert Xpress SARS-CoV-2/FLU/RSV  testing. Fact Sheet for Patients: PinkCheek.be Fact Sheet for Healthcare Providers: GravelBags.it This test is not yet approved or cleared by the [place] FDA and  has been authorized for detection and/or diagnosis of SARS-CoV-2 by  FDA under an Emergency Use Authorization (EUA). This EUA will remain  in effect (meaning this test can be used) for the duration of the  Covid-19 declaration under Section 564(b)(1) of the Act, 21  U.S.C. section 360bbb-3(b)(1), unless the authorization is  terminated or revoked. Performed at [facility], [place] [address] [place], [place] [address]   Culture, blood (routine x 2)     Status: None  (Preliminary result)   Collection Time: 07/10/19  1:19 AM   Specimen: BLOOD  Result Value Ref Range Status   Specimen Description   Final    BLOOD BLOOD RIGHT FOREARM Performed at [facility] [address] [place], [place] [address]    Special Requests   Final    BOTTLES DRAWN AEROBIC AND ANAEROBIC Blood Culture adequate volume Performed at [facility] [address] [place], [place] [address]    Culture   Final    NO GROWTH 4 DAYS Performed at [facility], [place] [address], [place], [place] [address]    Report Status PENDING  Incomplete  Culture, blood (routine x 2)     Status: None (Preliminary result)   Collection Time: 07/10/19  4:54 AM   Specimen: BLOOD  Result Value Ref Range Status   Specimen Description   Final    BLOOD BLOOD LEFT FOREARM Performed at [facility] [address], [place], [place] [address]    Special Requests   Final    BOTTLES DRAWN AEROBIC AND ANAEROBIC Blood Culture adequate volume Performed at [facility] [address], [place], [place] [address]    Culture   Final    NO GROWTH 4 DAYS Performed  at [facility], [place] [address], [place], [place] [address]    Report Status PENDING  Incomplete    RN Pressure Injury Documentation:     Estimated body mass index is 53.67 kg/m as calculated from the following:   Height as of this encounter: 5\' 6"  (1.676 m).   Weight as of this encounter: 150.8 kg.  Malnutrition Type:      Malnutrition Characteristics:      Nutrition Interventions:    Radiology Studies: No results found. Scheduled Meds: . apixaban  5 mg Oral BID  . atorvastatin  10 mg Oral Daily  . DULoxetine  40 mg Oral BID  . erythromycin  250 mg Oral TID WC & HS  . insulin aspart  0-15 Units Subcutaneous TID WC  . insulin glargine  25 Units Subcutaneous QHS  . metoprolol succinate  25 mg Oral Daily  . pantoprazole  40 mg Oral BID    Continuous Infusions:   LOS: 4 days   Kerney Elbe, DO [facility] PAGER is on [place]  If 7PM-7AM, please contact night-coverage [email or web address]

## 2019-07-14 NOTE — Discharge Instructions (Signed)
Low Fiber Nutrition Therapy  ° °You may need a low-fiber diet if you have Crohn's disease, diverticulitis, gastroparesis, ulcerative colitis, a new colostomy, or new ileostomy. A low-fiber diet may also be needed following radiation therapy to the pelvis and lower bowel or recent intestinal surgery. ° °A low-fiber diet reduces the frequency and volume of your stools. This lessens irritation to the gastrointestinal (GI) tract and can help you heal. Use this diet if you have a stricture so your intestine doesn't get blocked. The goal of this diet is to get less than 8 grams of fiber daily. It's also important to eat enough protein foods while you are on a low-fiber diet. ° °Drink nutrition supplements that have 1 gram of fiber or less in each serving. If your stricture is severe or if your inflammation is severe, drink more liquids to reduce symptoms and to get enough calories and protein. ° °Tips °Eat about 5 to 6 small meals daily or about every 3 to 4 hours. Do not skip meals.  °Every time you eat, include a small amount of protein (1 to 2 ounces) plus an additional food. Low fiber starch foods are the best choice to eat with protein.  °Limit acidic, spicy and high-fat or fried and greasy foods to reduce GI symptoms.  °Do not eat raw fruits and vegetables while on this diet. All fruits and vegetables need to be cooked and without peels or skins.  °Drink a lot of fluids, at least 8 cups of fluid each day. Limit drinks with caffeine, sugar, and sugar substitutes.  °Plain water is the best choice. Avoid mixing drink packets or flavor drops into water. .  °Take a chewable multivitamin with minerals. Gummy vitamins do not have enough minerals and can block an ostomy and non-chewable supplements are not easily digested. Chewable supplements must be used if you have a stricture or ostomy.  °If you are lactose intolerant, you may need to eat low-lactose dairy products. If you can't tolerate dairy, ask your RDN about how  you can get enough calcium from other foods.  °Do not take a calcium supplement. They can cause a blockage.  °It is important to add high-calcium foods gradually to your diet and monitor for symptoms to avoid a blockage.  °Do not add more fiber to your diet until your health care provider or registered dietitian nutritionist (RDN) tells you it's OK. Fiber is part of whole grains, fruits and vegetables (foods from plants) and needs to be slowly added back in to your diet when your body is healed.  °Choose foods that have been safely handled and prepared to lower your risk of foodborne illness. Talk to your RDN or see the Food Safety Nutrition Therapy handout for more information.  ° °Foods Recommended °These foods are low in fat and fiber and will help with your GI symptoms. °Food Group Foods Recommended  °Grains  Choose grain foods with less than 2 grams of fiber per serving. °Refined white flour products--for example, enriched white bread without seeds, crackers or pasta °Cream of wheat or rice °Grits (fine ground) °Tortillas: white flour or corn °White rice, well-cooked (do not rinse, or soak before cooking) °Cold and hot cereals made from white or refined flour such as puffed rice or corn flakes  °Protein Foods  Lean, very tender, well-cooked poultry or fish; red meats: beef, pork or lamb (slow cook until soft; chop meats if you have stricture or ostomy) °Eggs, well-cooked °Smooth nut butters such as almond,   peanut, or sunflower °Tofu  °Dairy  If you have lactose intolerance, drinking milk products from cows or goats may make diarrhea worse. Foods marked with an asterisk (*) have lactose. °Milk: fat-free, 1% or 2% * (choose best tolerated) °Lactose-free milk °Buttermilk* °Fortified non-dairy milks: almond, cashew, coconut, or rice (be aware that these options are not good sources of protein so you will need to eat an additional protein food) °Kefir* (Don't include kefir in the diet until approved by your health  care provider) °Yogurt*/lactose-free yogurt (without nuts, fruit, granola or chocolate) °Mild cheese* (hard and aged cheeses tend to be lower in lactose such as cheddar, swiss or parmesan) °Cottage cheese* or lactose-free cottage cheese °Low-fat ice cream* or lactose-free ice cream °Sherbet* (usually lower lactose)  °Vegetables  Canned and well-cooked vegetables without seeds, skins, or hulls  °Carrots or green beans, cooked °White, red or yellow potatoes without skins °Strained vegetable juice  °Fruit Soft, and well-cooked fruits without skins, seeds, or membranes °Canned fruit in juice: peaches, pears, or applesauce °Fruit juice without pulp diluted by half with water may be tolerated better °Fruit drinks fortified with vitamin C may be tolerated better than 100% fruit juice  °Oils  When possible, choose healthy oils and fats, such as olive and canola oils, plant oils rather than solid fats.  °Other  Broth and strained soups made from allowed foods °Desserts (small portions) without whole grains, seeds, nuts, raisins, or coconut °Jelly (clear)  ° °Foods Not Recommended °These foods are higher in fat and fiber and may make your GI symptoms worse.  °Food Group Foods Not Recommended  °Grains  Bread, whole wheat or with whole grain flour or seeds or nuts °Brown rice, quinoa, kasha, barley °Tortillas: whole grain °Whole wheat pasta °Whole grain and high-fiber cereals, including oatmeal, bran flakes or shredded wheat °Popcorn  °Protein Foods  Steak, pork chops, or other meats that are fatty or have gristle °Fried meat, poultry, or fish °Seafood with a tough or rubbery texture, such as shrimp °Luncheon meats such as bologna and salami °Sausage, bacon, or hot dogs °Dried beans, peas, or lentils °Hummus °Sushi °Nuts and chunky nut butters  °Dairy  Whole milk °Pea milk and soymilk (may cause diarrhea, gas, bloating, and abdominal pain) °Cream °Half-and-half °Sour cream °Yogurt with added fruit, nuts, or granola or chocolate   °Vegetables  Alfalfa or bean sprouts (high fiber and risk for bacteria) °Raw or undercooked vegetables: beets; broccoli; brussels sprouts; cabbage; cauliflower; collard, mustard, or turnip greens; corn; cucumber; green peas or any kind of peas; kale; lima beans; mushrooms; okra; olives; pickles and relish; onions; parsnips; peppers; potato skins; sauerkraut; spinach; tomatoes  °Fruit Raw fruit °Dried fruit °Avocado, berries, coconut °Canned fruit in syrup °Canned fruit with mandarin oranges, papaya or pineapple °Fruit juice with pulp °Prune juice °Fruit skin  °Oils  Pork rinds  ° °Low-Fiber (8 grams) Sample 1-Day Menu  °Breakfast ½ cup cream of wheat (0.5 gram fiber)  °1 slice white toast (1 gram fiber)  °1 teaspoon margarine, soft tub  °2 scrambled eggs   °Morning Snack 1 cup lactose-free nutrition supplement  °Lunch 2 slices white bread (2 grams fiber)  °3 tablespoons tuna  °1 tablespoon mayonnaise  °1 cup chicken noodle soup (1 gram fiber)  °½ cup apple juice   °Afternoon Snack 6 saltine crackers (0.5 gram fiber)  °2 ounces low-fat cheddar cheese  °Evening Meal 3 ounces tender chicken breast  °1 cup white rice (0.5 gram fiber)  °½ cup   cooked canned green beans (2 grams fiber)  °½ cup cranberry juice   °Evening Snack 1 cup lactose-free nutrition supplement  ° °Copyright 2020 © [employer] ° °High Fiber Nutrition Therapy  °Fiber and fluid may help you feel less constipated and bloated and can also help ease diarrhea. Increase fiber slowly over the course of a few weeks. This will keep your symptoms from getting worse. °Tips °Tips for Adding Fiber to Your Eating Plan °Slowly increase the amount of fiber you eat to 25 to 35 grams per day. °Eat whole grain breads and cereals. Look for choices with 100% whole wheat, rye, oats, or bran as the first or second ingredient. °Have brown or wild rice instead of white rice or potatoes. °Enjoy a variety of grains. Good choices include barley, oats,  farro, kamut, and quinoa. °Bake with whole wheat flour. You can use it to replace some white or all-purpose flour in recipes. °Enjoy baked beans more often! Add dried beans and peas to casseroles or soups. °Choose fresh fruit and vegetables instead of juices. °Eat fruits and vegetables with peels or skins on. °Compare food labels of similar foods to find higher fiber choices. On packaged foods, the amount of fiber per serving is listed on the Nutrition Facts label. °Check the Nutrition Facts labels and try to choose products with at least 4 g dietary fiber per serving. °Drink plenty of fluids. Set a goal of at least 8 cups per day. You may need even more fluid as you eat higher amounts of fiber. Fluid helps your body process fiber without discomfort. °Foods Recommended °Foods With at Least 4 g Fiber per Serving °Food Group Choose  °Grains ?-½ cup high-fiber cereal  °Dried beans and peas ½ cup cooked red beans, kidney beans, large lima beans, navy beans, pinto beans, white beans, lentils, or black-eyed peas  °Vegetables 1 artichoke (cooked)  °Fruits ½ cup blackberries or raspberries °4 dried prunes  ° °Foods With 1 to 3 g Fiber per Serving °Food Group Choose  °Grains 1 bagel (3.5-inch diameter) °1 slice whole wheat, cracked wheat, pumpernickel, or rye bread °2-inch square cornbread °4 whole wheat crackers °1 bran, blueberry, cornmeal, or English muffin °½ cup cereal with 1-3 g fiber per serving (check dietary fiber on the product's Nutrition Facts label) °2 tablespoons wheat germ or whole wheat flour  °Fruits 1 apple (3-inch diameter) or ½ cup applesauce °½ cup apricots (canned) °1 banana °½ cup cherries (canned or fresh) °½ cup cranberries (fresh) °3 dates °2 medium figs (fresh) °½ cup fruit cocktail (canned) °½ grapefruit °1 kiwi fruit °1 orange (2½-inch diameter) °1 peach (fresh) or ½ cup peaches (canned) °1 pear (fresh) or ½ cup pears (canned) °1 plum (2-inch diameter) °¼ cup raisins °½ cup strawberries  (fresh) °1 tangerine  °Vegetables ½ cup bean sprouts (raw) °½ cup beets (diced, canned) °½ cup broccoli, brussels sprouts, or cabbage  (cooked) °½ cup carrots °½ cup cauliflower °½ cup corn °½ cup eggplant °½ cup okra (boiled) °½ cup potatoes (baked or mashed) °½ cup spinach, kale, or turnip greens (cooked) °½ cup squash--winter, summer, or zucchini (cooked) °½ cup sweet potatoes or yams °½ cup tomatoes (canned)  °Other 2 tablespoons almonds or peanuts °1 cup popcorn (popped)  ° °High Fiber Vegetarian (Lacto-Ovo) Sample 1-Day Menu  °Breakfast ½ cup bran cereal  °1 banana °½ cup blueberries °1 cup 1% milk  °Lunch 2 slices whole wheat bread  °2 tablespoons hummus °1 ounce cheddar cheese °  1 leaf lettuce °2 slices tomato °½ cup vegetarian baked beans °1 orange °1 cup 1% milk  °Evening Meal Stir fry made with: ½ cup tempeh  °½ cup brown rice °1 cup frozen broccoli °1 tablespoon soy sauce °¼ cup peanuts  °1 pear  °Evening Snack 6 ounces fruit yogurt  °1 cup air popped popcorn  °High Fiber Vegan Sample 1-Day Menu  °Breakfast ½ cup bran cereal  °1 banana °½ cup blueberries °1 cup soymilk fortified with calcium, vitamin B12, and vitamin D  °Lunch ½ cup chili with beans with:  °½ cup tempeh crumbles °¼ cup crushed whole wheat crackers °1 apple °1 cup soymilk fortified with calcium, vitamin B12, and vitamin D  °Evening Meal 1 veggie burger  °1 whole wheat bun  °1 leaf lettuce  °1 slice tomato °Salad made with: 1 cup lettuce °¼ cup chickpeas  °½ cucumbers °1 tablespoon italian dressing °1 cup strawberries   °Evening Snack ¼ cup almonds  °1 cup carrot sticks  °High Fiber Sample 1-Day Menu  °Breakfast 1/2 cup orange juice, with pulp  °1/2 cup raisin bran °1 cup fat-free milk °1 cup coffee  °Morning Snack 1 cup plain yogurt  °2 cups water  °Lunch 1 1/2 cups chili  °1/2 cup kidney beans °1/2 cup soy crumble °2 tablespoons shredded cheese °8 whole wheat crackers °1 apple (with skin)   °Evening Meal 2 ounces sliced chicken  °1/4 cup  tofu °2 cups mixed fresh vegetables °1 cup brown rice °1/2 cup strawberries °1 cup hot tea  °Evening Snack 2 tablespoons almonds  °1 cup hot chocolate  ° °Copyright 2020 © [employer] ° °

## 2019-07-15 LAB — CBC WITH DIFFERENTIAL/PLATELET
Abs Immature Granulocytes: 0.07 10*3/uL (ref 0.00–0.07)
Basophils Absolute: 0.1 10*3/uL (ref 0.0–0.1)
Basophils Relative: 1 %
Eosinophils Absolute: 0.3 10*3/uL (ref 0.0–0.5)
Eosinophils Relative: 3 %
HCT: 25 % — ABNORMAL LOW (ref 36.0–46.0)
Hemoglobin: 7.5 g/dL — ABNORMAL LOW (ref 12.0–15.0)
Immature Granulocytes: 1 %
Lymphocytes Relative: 19 %
Lymphs Abs: 1.9 10*3/uL (ref 0.7–4.0)
MCH: 26.7 pg (ref 26.0–34.0)
MCHC: 30 g/dL (ref 30.0–36.0)
MCV: 89 fL (ref 80.0–100.0)
Monocytes Absolute: 0.6 10*3/uL (ref 0.1–1.0)
Monocytes Relative: 6 %
Neutro Abs: 7.2 10*3/uL (ref 1.7–7.7)
Neutrophils Relative %: 70 %
Platelets: 443 10*3/uL — ABNORMAL HIGH (ref 150–400)
RBC: 2.81 MIL/uL — ABNORMAL LOW (ref 3.87–5.11)
RDW: 14.7 % (ref 11.5–15.5)
WBC: 10.1 10*3/uL (ref 4.0–10.5)
nRBC: 0 % (ref 0.0–0.2)

## 2019-07-15 LAB — COMPREHENSIVE METABOLIC PANEL
ALT: 10 U/L (ref 0–44)
AST: 10 U/L — ABNORMAL LOW (ref 15–41)
Albumin: 2.8 g/dL — ABNORMAL LOW (ref 3.5–5.0)
Alkaline Phosphatase: 77 U/L (ref 38–126)
Anion gap: 9 (ref 5–15)
BUN: 15 mg/dL (ref 6–20)
CO2: 26 mmol/L (ref 22–32)
Calcium: 9.1 mg/dL (ref 8.9–10.3)
Chloride: 105 mmol/L (ref 98–111)
Creatinine, Ser: 1.11 mg/dL — ABNORMAL HIGH (ref 0.44–1.00)
GFR calc Af Amer: >60 mL/min (ref >60)
GFR calc non Af Amer: 56 mL/min — ABNORMAL LOW (ref >60)
Glucose, Bld: 220 mg/dL — ABNORMAL HIGH (ref 70–99)
Potassium: 3.6 mmol/L (ref 3.5–5.1)
Sodium: 140 mmol/L (ref 135–145)
Total Bilirubin: 0.5 mg/dL (ref 0.3–1.2)
Total Protein: 6.4 g/dL — ABNORMAL LOW (ref 6.5–8.1)

## 2019-07-15 LAB — CULTURE, BLOOD (ROUTINE X 2)
Culture: NO GROWTH
Culture: NO GROWTH
Special Requests: ADEQUATE
Special Requests: ADEQUATE

## 2019-07-15 LAB — MAGNESIUM: Magnesium: 1.5 mg/dL — ABNORMAL LOW (ref 1.7–2.4)

## 2019-07-15 LAB — GLUCOSE, CAPILLARY
Glucose-Capillary: 194 mg/dL — ABNORMAL HIGH (ref 70–99)
Glucose-Capillary: 194 mg/dL — ABNORMAL HIGH (ref 70–99)
Glucose-Capillary: 330 mg/dL — ABNORMAL HIGH (ref 70–99)
Glucose-Capillary: 196 mg/dL — ABNORMAL HIGH (ref 70–99)

## 2019-07-15 LAB — PHOSPHORUS: Phosphorus: 2.9 mg/dL (ref 2.5–4.6)

## 2019-07-15 MED ORDER — LORATADINE 10 MG PO TABS
10.0000 mg | ORAL_TABLET | Freq: Every day | ORAL | Status: DC
  Administered 2019-07-15 – 2019-07-19 (×5): 10 mg via ORAL
  Filled 2019-07-15 (×5): qty 1

## 2019-07-15 MED ORDER — MAGNESIUM OXIDE 400 (241.3 MG) MG PO TABS
400.0000 mg | ORAL_TABLET | Freq: Two times a day (BID) | ORAL | Status: DC
  Administered 2019-07-15 – 2019-07-19 (×9): 400 mg via ORAL
  Filled 2019-07-15 (×9): qty 1

## 2019-07-15 MED ORDER — ALBUTEROL SULFATE (2.5 MG/3ML) 0.083% IN NEBU: 2.5000 mg | INHALATION_SOLUTION | Freq: Four times a day (QID) | RESPIRATORY_TRACT | Status: DC | PRN

## 2019-07-15 MED ORDER — TORSEMIDE 20 MG PO TABS
40.0000 mg | ORAL_TABLET | Freq: Every day | ORAL | Status: DC
  Administered 2019-07-15 – 2019-07-16 (×2): 40 mg via ORAL
  Filled 2019-07-15 (×3): qty 2

## 2019-07-15 MED ORDER — ISOSORBIDE MONONITRATE ER 60 MG PO TB24
60.0000 mg | ORAL_TABLET | Freq: Every day | ORAL | Status: DC
  Administered 2019-07-15 – 2019-07-19 (×5): 60 mg via ORAL
  Filled 2019-07-15 (×5): qty 1

## 2019-07-15 MED ORDER — FLUTICASONE PROPIONATE 50 MCG/ACT NA SUSP
2.0000 | Freq: Every day | NASAL | Status: DC | PRN
  Filled 2019-07-15: qty 16

## 2019-07-15 MED ORDER — MAGNESIUM SULFATE 2 GM/50ML IV SOLN
2.0000 g | Freq: Once | INTRAVENOUS | Status: AC
  Administered 2019-07-15: 2 g via INTRAVENOUS
  Filled 2019-07-15: qty 50

## 2019-07-15 MED ORDER — METOCLOPRAMIDE HCL 5 MG/ML IJ SOLN
10.0000 mg | Freq: Three times a day (TID) | INTRAMUSCULAR | Status: AC
  Administered 2019-07-15 (×2): 10 mg via INTRAVENOUS
  Filled 2019-07-15 (×3): qty 2

## 2019-07-15 MED ORDER — NITROGLYCERIN 0.4 MG SL SUBL: 0.4000 mg | SUBLINGUAL_TABLET | SUBLINGUAL | Status: DC | PRN

## 2019-07-15 MED ORDER — HYDRALAZINE HCL 25 MG PO TABS
25.0000 mg | ORAL_TABLET | Freq: Three times a day (TID) | ORAL | Status: DC
  Administered 2019-07-15 – 2019-07-19 (×13): 25 mg via ORAL
  Filled 2019-07-15 (×13): qty 1

## 2019-07-15 NOTE — Progress Notes (Signed)
PROGRESS NOTE    Deborah [name]  ZOX:[id number] DOB: [birth year] DOA: 07/09/2019 PCP: [name], DO  Brief Narrative:  HPI per Dr. Shela Leff on 07/10/19 Deborah [name] is a 54 y.o. female with medical history significant of chronic systolic CHF (EF 40 to 81% on echo done 01/2019), paroxysmal A. fib on Eliquis, insulin-dependent type 2 diabetes with history of recurrent admissions for DKA (most recent 4/23-4/28), CKD stage III, hypertension, hyperlipidemia, CVA, gout, diabetic gastroparesis intolerant to Reglan, gastric ulcer, GERD, anemia of chronic disease, chronic pain, depression, morbid obesity (BMI 54.07) presenting to the ED via EMS with complaints of nausea and vomiting.  Patient states yesterday afternoon she had a ham sandwich and soon after started vomiting.  She also had some substernal sharp chest pain and shortness of breath at that time.  She has continued to have 10 out of 10 intensity generalized abdominal pain since then.  Also continue to have some chest pain since then.  She has continued to vomit and has not been able to tolerate any p.o. intake.  Does report having a normal bowel movement in the afternoon.  She does not recall taking erythromycin since her recent hospital discharge.  States her blood glucose has been in the 200s at home.  ED Course: Afebrile.  Tachycardic and tachypneic on arrival.  Not hypoxic.  WBC count 18.6.  Lactic acid 3.2.  Hemoglobin 7.8, stable compared to labs done during recent hospitalization.  Platelet count 594, previously normal.  Potassium 2.6.  Magnesium level 0.7.  Bicarb 25, anion gap 17.  Blood glucose 119.  BUN 23, creatinine 1.5.  Creatinine was 1.1 at the time of recent hospital discharge.  Lipase and LFTs normal.  UA pending.  Blood ethanol level undetectable.  Blood culture x2 pending.  UDS pending.  SARS-CoV-2 PCR test negative.  Influenza panel negative. No imaging done. Patient received Dilaudid, 1 L LR bolus, IV  magnesium 2 g, IV potassium supplementation, and Zofran.  **Interim History Continues to have some nausea vomiting and abdominal pain so her diet was slowly advanced but she has not been very compliant with her diet.  Gastroenterology was consulted for further evaluation recommendations and she states that she ate half of her burger the day before yesterday but continues to have some nausea and vomiting and states that she is having some loose watery bowel movements..  She continues to have small amount of emesis and GI continues to recommend low fiber, low residue diet and small frequent meals.    GI ahd  recommended some Reglan but patient wants avoid the side effects of tardive dyskinesia and tremors but today she is amenable to taking 3 doses.  GI is scheduled her on 10 mg IV every 8 for 3 doses and then stopping follow-up continuing the erythromycin to 50 mg p.o. 3 times daily with meals and bedtime along with Phenergan 25 mg IV every 6 as needed for nausea vomiting  GI today recommends a nutritionist consultation and recommends avoiding narcotic usage and we have tried to wean this back today and cut her back from 1 mg of Dilaudid every 4 hours to 0.5  milligram every 8 hours yesterday with the hopes of getting her off of it any narcotic medications and will stop it today.  Dietitian spoke with the patient but patient verbalized understanding and did not really want to talk and so we will expect full compliance with her diet education.  Appreciate GIs assistance.  Assessment & Plan:  Principal Problem:   Intractable nausea and vomiting Active Problems:   Hypokalemia   Leukocytosis   Abdominal pain   Lactic acidosis  Abdominal pain, intractable nausea and vomiting in the setting of Diabetic Gastroparesis  -Patient presenting with acute onset nausea/vomiting and abdominal pain.  Suspicion for diabetic gastroparesis.   -Patient is afebrile, but had elevated WBC count of 16.2 and a lactic  acid of 3.2 on admission; now WBC is 10.1 -Lipase and LFTs within normal limits as AST is 10, ALT is 10  -CT Abd/Pelvis showed "No acute finding including bowel obstruction or visible inflammation.  Chronic findings are stable from CT 1 month ago and described above." -Previous gastric emptying study 2018 that was unrevealing -N.p.o initially but now on a CLD and will attempt Soft Diet and she is now on a soft diet but has not been compliant with a low fiber, low residue diet -NS at 75 mL's per hour now stopped  -Started on Erythromycin 250 mg p.o. 3 times daily with meals and nightly and will continue -C/w Supportive care, Antiemetics with Promethazine 25 mg IV q6hprn Nausea and Vomiting  -C/w PPI with Pantoprazole 40 mg po BID  -Judicious use of Narcotic Medications; Currently has Hydromorphone 1 mg IV q4hprn Severe Pain and we have reduced this to half a milligram every 8 hours as needed for severe pain and this is now been weaned off and stopped -Consulted gastroenterology for further evaluation and recommendations appreciate their evaluation -GI recommended some recommended above patient wants to avoid it due to the side effect of tardive dyskinesia and tremors.  The day before yesterday she is having her first solid meal since admission but she continues to have a small amount of emesis and has been nauseous and has not been compliant with her diet.  Gastroenterology recommending continuing low fiber, low residue diet and small frequent meals but patient does not want this.  She had scrambled eggs this morning but has been seen to order high carb meals; dietitian was consulted for diet education and will continue to try and control her blood sugars carefully and avoid narcotics -CBG's had been better controlled but now ranging from 194-330 -continue supportive management given that this is slow to improve and because she is noncompliant.;  Still complains of abdominal pain despite the fact that  she wants to continue to have burgers and chicken -GI has stress and urge that food with low fiber and more liquid consistency would be preferred with gastroparesis but patient does not want to try this and she does not want her erythema sent to be increased and is currently to 50 mg p.o. 3 times daily with meals and bedtime -Gastroenterology has spoke to her about Reglan and she is now amenable and so they will be giving her 10 mg IV every 8 hours for 3 doses and then stopping to see if this helps -She clinically has been about the same the last few days with intermittent nausea vomiting as well as abdominal pain.  Atypical Chest Pain -Improved initially but was having chest pain again and radiated into her shoulders -Initial EKG without acute ischemic changes.  High-sensitivity troponin 30 and trended down to 23 -Continue to trend troponin and that she is having it again and repeat troponin this morning was 26 and after that was 22 -Repeat EKG yesterday AM showed a normal sinus rhythm at a rate of 94 with a QTC of 460 with no evidence of any ischemic changes on  my interpretation -Replete Electrolytes -Continue to monitor on telemetry  Leukocytosis, improved -Etiology likely reactive from dehydration.   -Unlikely infectious etiology with normal procalcitonin.   -No acute findings on CT abdomen/pelvis.  -Patient's WBC is now improved to 10.1 -Urinalysis Still pending but UDS was Negative  -Blood cultures x2: NGTD at 3 days -Continue to monitor CBC daily and continue to evaluate carefully  Lactic Acidosis -Likely secondary to dehydration.   -Unlikely infectious process.   -Procalcitonin less than 0.10. -Blood ethanol level undetectable.    -CT abdomen/pelvis negative. -Lactic Acid 3.2--> 2.1 --> 1.2 -IV fluid hydration is now stopped as she is improved and lactic acid level is now normalized  Hypokalemia -Likely related to decreased p.o. intake from intractable nausea/vomiting and  home diuretic use.  -Severe hypomagnesemia also likely contributing (0.7 on admission) -K+ this AM was 3.6 -EKG without acute changes.  -Continue to hold home Torsemide -Continue to monitor electrolytes daily  Severe Hypomagnesemia -Improved.  Patient's magnesium went from 0.7 is now 1.5 -Replete With IV mag sulfate 2 g -Continue to monitor and replete as necessary -Repeat magnesium level in the a.m.  AKI on CKD stage III:  -BUN 23, creatinine 1.5, creatinine was 1.1 at the time of recent hospital discharge.  -Suspect prerenal from dehydration and home diuretic use.  -Continue holding home diuretic with torsemide -Continued NS at 75 mL's per hour but this is now stopped -Patient's BUN/creatinine went from 20/1.11 -> 21/1.31 -> 21/1.27 -> 23/1.53  and in the setting of emesis and may need to go back on IV fluids if she continues to worsen but it is improved and is now 15/1.11 -Avoid nephrotoxic medications, contrast dyes, hypotension and renally adjust medications -Repeat CMP in a.m.  Poorly controlled Insulin-Dependent Type 2 Diabetes  -Hemoglobin A1c A1c 8.4.   -Home regimen includes Lantus 45 units subcutaneously daily, NovoLog 15 subcutaneously 3 times daily AC. -Diabetic educator following, appreciate assistance -Restart Lantus at a lower dose, 25 units of insulin daily -C/w Insulin sliding scale for coverage with moderate NovoLog/scale AC -CBG's elevated and ranging from 194-330 now  Hypertension -Resumed her home hydralazine 25 mg p.o. 3 times daily and will continue metoprolol 25 mg p.o. daily  CAD -Continue with metoprolol succinate 25 mg p.o. daily, nitroglycerin 0.4 mg every 5 minutes, atorvastatin 10 mg p.o. daily, hydralazine, 25 mg p.o. 3 times daily, as well as isosorbide mononitrate  Chronic Systolic CHF -EF 40 to 12% on TTE 01/2019. -No signs of volume overload at this time. -Resumed her home torsemide today -Continue Metoprolol Succinate 25 mg p.o.  daily -Resumed her hydralazine and Imdur combination today -Continue to monitor volume status carefully and continue to monitor for signs and symptoms of volume overload -Strict I's and O's and daily weights; She is + 2.915 liters since admission and Weight was not done and was 335 and is now 331  Paroxysmal Atrial Fibrilliation -Currently in sinus rhythm. Continue home Metoprolol XL 25 mg po Daily and Apixaban 5 mg po BID -Checking TSH  GERD -Continue PPI with Pantoprazole 40 mg po BID  Anemia of chronic disease -Patient's hemoglobin/hematocrit went from 8.6/20.5 is now 7.2/24.2 -Anemia panel done on 06/26/2019 showed an iron level of 18, U IBC of 327, TIBC of 345, saturation ratios of 5%, ferritin level 48, folate level of 6.6, and vitamin B12 level of 251 -Continue to monitor for signs and symptoms of bleeding as she is anticoagulated with Apixaban; currently no overt bleeding noted -Repeat CBC in  a.m.  Hyperlipidemia  -Resumed her atorvastatin 10 mg p.o. daily  Depression -Remained stable.  Patient maintained on home regimen of antidepressant medication with Duloxetine 40 mg po BID  Super Morbid Obesity -Estimated body mass index is 53.42 kg/m as calculated from the following:   Height as of this encounter: 5\' 6"  (1.676 m).   Weight as of this encounter: 150.1 kg. -Weight Loss and Dietary Counseling given   Thrombocytosis -Patient's platelet count went from 361 is now 403 -Continue to monitor and trend and repeat CBC in the AM  DVT prophylaxis: Anticoagulated with Apixaban 5 mg po BID  Code Status: FULL CODE  Family Communication: No family present at bedside  Disposition Plan: Patient is from home likely to be discharged home when she is able to tolerate a diet without abdominal pain or nausea but she has not been very compliant with her diet.  Gastroenterology has been consulted given her suspected diabetic gastroparesis.  We have also consulted PT OT to further  evaluate and treat and she has no follow-up.  She is now going to be started on IV Reglan  Status is: Inpatient  Remains inpatient appropriate because:Persistent severe electrolyte disturbances, Ongoing active pain requiring inpatient pain management and Inpatient level of care appropriate due to severity of illness; continues to have abdominal pain and nausea vomiting and p.o. intake has been intermittent   Dispo: The patient is from: Home              Anticipated d/c is to: Home              Anticipated d/c date is: 1 day              Patient currently is not medically stable to d/c.  And will need GI clearance prior to discharge and she is now going to be started on IV Reglan   Consultants:   Gastroenterology    Procedures: None   Antimicrobials:  Anti-infectives (From admission, onward)   Start     Dose/Rate Route Frequency Ordered Stop   07/10/19 0800  erythromycin (E-MYCIN) tablet 250 mg     250 mg Oral 3 times daily with meals & bedtime 07/10/19 0342       Subjective: Seen and examined at bedside and states that she continues to have nausea and emesis and she vomited again last night.  She ate some chicken last night and she states that she tolerated okay but now when she lay down she started vomiting.  After conversation with GI she is amenable to trying Reglan.  No nausea or vomiting currently but had it earlier.  No other concerns or complaints at this time.  Objective: Vitals:   07/14/19 2031 07/15/19 0550 07/15/19 0705 07/15/19 1616  BP: 130/62 137/77  122/77  Pulse: 89 (!) 102  93  Resp: 19 20  20   Temp: 97.9 F (36.6 C) 97.6 F (36.4 C)  (!) 97.5 F (36.4 C)  TempSrc: Oral Oral    SpO2: 99% 96%  97%  Weight:   (!) 150.1 kg   Height:        Intake/Output Summary (Last 24 hours) at 07/15/2019 1805 Last data filed at 07/14/2019 2200 Gross per 24 hour  Intake 30 ml  Output --  Net 30 ml   Filed Weights   07/13/19 0625 07/14/19 0532 07/15/19 0705  Weight:  (!) 152.8 kg (!) 150.8 kg (!) 150.1 kg   Examination: Physical Exam:  Constitutional: WN/WD African-American female  currently lying in bed currently in no acute distress but still complains of some abdominal pain.  Appears a little uncomfortable Eyes: Lids and conjunctivae normal, sclerae anicteric; she does have some protuberant eyeballs ENMT: External Ears, Nose appear normal. Grossly normal hearing. Mucous membranes are moist.  Neck: Appears normal, supple, no cervical masses, normal ROM, no appreciable thyromegaly; no JVD Respiratory: Diminished to auscultation bilaterally, no wheezing, rales, rhonchi or crackles. No accessory muscle use.  Unlabored breathing Cardiovascular: RRR, no murmurs / rubs / gallops. S1 and S2 auscultated.  1+ extremity edema.  Abdomen: Soft, non-tender, distended secondary to body habitus.   Bowel sounds positive x4.  GU: Deferred. Musculoskeletal: No clubbing / cyanosis of digits/nails. No joint deformity upper and lower extremities.  Skin: No rashes, lesions, ulcers on limited skin evaluation. No induration; Warm and dry.  Neurologic: CN 2-12 grossly intact with no focal deficits. Romberg sign cerebellar reflexes not assessed.  Psychiatric: Normal judgment and insight. Alert and oriented x 3. Normal mood and appropriate affect.   Data Reviewed: I have personally reviewed following labs and imaging studies  CBC: Recent Labs  Lab 07/11/19 0459 07/12/19 0457 07/13/19 0436 07/14/19 0547 07/15/19 0600  WBC 12.7* 15.3* 10.1 8.9 10.1  NEUTROABS  --  12.5* 6.6 5.8 7.2  HGB 7.3* 7.1* 7.0* 7.2* 7.5*  HCT 24.9* 24.0* 23.5* 24.2* 25.0*  MCV 90.2 90.6 90.0 90.0 89.0  PLT 382 374 361 403* 409*   Basic Metabolic Panel: Recent Labs  Lab 07/11/19 0459 07/12/19 0457 07/13/19 0436 07/14/19 0547 07/15/19 0600  NA 140 135 136 139 140  K 3.3* 3.7 3.8 3.7 3.6  CL 102 99 102 103 105  CO2 28 26 26 27 26   GLUCOSE 184* 234* 194* 212* 220*  BUN 21* 21* 23* 19 15    CREATININE 1.31* 1.27* 1.53* 1.17* 1.11*  CALCIUM 7.7* 7.8* 8.6* 8.8* 9.1  MG 2.1 1.9 2.0 1.7 1.5*  PHOS  --  2.6 3.2 2.6 2.9   GFR: Estimated Creatinine Clearance: 86.4 mL/min (A) (by C-G formula based on SCr of 1.11 mg/dL (H)). Liver Function Tests: Recent Labs  Lab 07/11/19 0459 07/12/19 0457 07/13/19 0436 07/14/19 0547 07/15/19 0600  AST 12* 10* 8* 10* 10*  ALT 10 8 9 10 10   ALKPHOS 78 84 75 78 77  BILITOT 0.5 0.6 0.6 0.6 0.5  PROT 6.2* 6.2* 6.2* 6.3* 6.4*  ALBUMIN 2.7* 2.7* 2.6* 2.7* 2.8*   Recent Labs  Lab 07/09/19 2249  LIPASE 24   No results for input(s): AMMONIA in the last 168 hours. Coagulation Profile: No results for input(s): INR, PROTIME in the last 168 hours. Cardiac Enzymes: No results for input(s): CKTOTAL, CKMB, CKMBINDEX, TROPONINI in the last 168 hours. BNP (last 3 results) No results for input(s): PROBNP in the last 8760 hours. HbA1C: No results for input(s): HGBA1C in the last 72 hours. CBG: Recent Labs  Lab 07/14/19 1558 07/14/19 2033 07/15/19 0746 07/15/19 1149 07/15/19 1633  GLUCAP 214* 230* 194* 194* 330*   Lipid Profile: No results for input(s): CHOL, HDL, LDLCALC, TRIG, CHOLHDL, LDLDIRECT in the last 72 hours. Thyroid Function Tests: Recent Labs    07/14/19 1017  TSH 1.546  FREET4 1.21*   Anemia Panel: No results for input(s): VITAMINB12, FOLATE, FERRITIN, TIBC, IRON, RETICCTPCT in the last 72 hours. Sepsis Labs: Recent Labs  Lab 07/10/19 0119 07/10/19 0454 07/11/19 0459  PROCALCITON  --  <0.10  --   LATICACIDVEN 3.2* 2.1* 1.2  Recent Results (from the past 240 hour(s))  Respiratory Panel by RT PCR (Flu A&B, Covid) - Nasopharyngeal Swab     Status: None   Collection Time: 07/10/19  1:19 AM   Specimen: Nasopharyngeal Swab  Result Value Ref Range Status   SARS Coronavirus 2 by RT PCR NEGATIVE NEGATIVE Final    Comment: (NOTE) SARS-CoV-2 target nucleic acids are NOT DETECTED. The SARS-CoV-2 RNA is generally  detectable in upper respiratoy specimens during the acute phase of infection. The lowest concentration of SARS-CoV-2 viral copies this assay can detect is 131 copies/mL. A negative result does not preclude SARS-Cov-2 infection and should not be used as the sole basis for treatment or other patient management decisions. A negative result may occur with  improper specimen collection/handling, submission of specimen other than nasopharyngeal swab, presence of viral mutation(s) within the areas targeted by this assay, and inadequate number of viral copies (<131 copies/mL). A negative result must be combined with clinical observations, patient history, and epidemiological information. The expected result is Negative. Fact Sheet for Patients:  PinkCheek.be Fact Sheet for Healthcare Providers:  GravelBags.it This test is not yet ap proved or cleared by the [place] FDA and  has been authorized for detection and/or diagnosis of SARS-CoV-2 by FDA under an Emergency Use Authorization (EUA). This EUA will remain  in effect (meaning this test can be used) for the duration of the COVID-19 declaration under Section 564(b)(1) of the Act, 21 U.S.C. section 360bbb-3(b)(1), unless the authorization is terminated or revoked sooner.    Influenza A by PCR NEGATIVE NEGATIVE Final   Influenza B by PCR NEGATIVE NEGATIVE Final    Comment: (NOTE) The Xpert Xpress SARS-CoV-2/FLU/RSV assay is intended as an aid in  the diagnosis of influenza from Nasopharyngeal swab specimens and  should not be used as a sole basis for treatment. Nasal washings and  aspirates are unacceptable for Xpert Xpress SARS-CoV-2/FLU/RSV  testing. Fact Sheet for Patients: PinkCheek.be Fact Sheet for Healthcare Providers: GravelBags.it This test is not yet approved or cleared by the [place] FDA and  has been  authorized for detection and/or diagnosis of SARS-CoV-2 by  FDA under an Emergency Use Authorization (EUA). This EUA will remain  in effect (meaning this test can be used) for the duration of the  Covid-19 declaration under Section 564(b)(1) of the Act, 21  U.S.C. section 360bbb-3(b)(1), unless the authorization is  terminated or revoked. Performed at [facility], [place] [address], [place], [place] [address]   Culture, blood (routine x 2)     Status: None   Collection Time: 07/10/19  1:19 AM   Specimen: BLOOD  Result Value Ref Range Status   Specimen Description   Final    BLOOD BLOOD RIGHT FOREARM Performed at [facility] [address] [place], [place] [address]    Special Requests   Final    BOTTLES DRAWN AEROBIC AND ANAEROBIC Blood Culture adequate volume Performed at [facility] [address] [place], [place] [address]    Culture   Final    NO GROWTH 5 DAYS Performed at [facility], [place] [address], [place], [place] [address]    Report Status 07/15/2019 FINAL  Final  Culture, blood (routine x 2)     Status: None   Collection Time: 07/10/19  4:54 AM   Specimen: BLOOD  Result Value Ref Range Status   Specimen Description   Final    BLOOD BLOOD LEFT FOREARM Performed  at [facility], [place] [address] [place], [place] [address]    Special Requests   Final    BOTTLES DRAWN AEROBIC AND ANAEROBIC Blood Culture adequate volume Performed at [facility] [address], [place], [place] [address]    Culture   Final    NO GROWTH 5 DAYS Performed at [facility], [place] [address], [place], [place] [address]    Report Status 07/15/2019 FINAL  Final    RN Pressure Injury Documentation:     Estimated body mass index is 53.42 kg/m as calculated from the following:   Height as of this encounter: 5\' 6"  (1.676 m).   Weight as of this encounter: 150.1 kg.  Malnutrition  Type:      Malnutrition Characteristics:      Nutrition Interventions:    Radiology Studies: No results found. Scheduled Meds: . apixaban  5 mg Oral BID  . atorvastatin  10 mg Oral Daily  . DULoxetine  40 mg Oral BID  . erythromycin  250 mg Oral TID WC & HS  . hydrALAZINE  25 mg Oral TID  . insulin aspart  0-15 Units Subcutaneous TID WC  . insulin glargine  25 Units Subcutaneous QHS  . isosorbide mononitrate  60 mg Oral Daily  . loratadine  10 mg Oral Daily  . magnesium oxide  400 mg Oral BID  . metoCLOPramide (REGLAN) injection  10 mg Intravenous Q8H  . metoprolol succinate  25 mg Oral Daily  . pantoprazole  40 mg Oral BID  . torsemide  40 mg Oral Daily   Continuous Infusions:   LOS: 5 days   Kerney Elbe, DO [facility] PAGER is on AMION  If 7PM-7AM, please contact night-coverage [email or web address]

## 2019-07-15 NOTE — Progress Notes (Signed)
Subjective: Patient states she had a treatment for dinner last night and vomited that food. She has not eaten anything today. She had lower abdominal pain which is improved. She continues to have loose stools, on a daily basis.  Objective: Vital signs in last 24 hours: Temp:  [97.6 F (36.4 C)-98 F (36.7 C)] 97.6 F (36.4 C) (05/09 0550) Pulse Rate:  [85-102] 102 (05/09 0550) Resp:  [19-20] 20 (05/09 0550) BP: (126-137)/(62-77) 137/77 (05/09 0550) SpO2:  [96 %-100 %] 96 % (05/09 0550) Weight:  [150.1 kg] 150.1 kg (05/09 0705) Weight change:  Last BM Date: 07/14/19  PE: Morbidly obese, sitting on bedside chair  GENERAL: Not in distress, able to speak in full sentences ABDOMEN: Nondistended, nontender, normoactive bowel sounds EXTREMITIES: No deformity, no edema  Lab Results: Results for orders placed or performed during the hospital encounter of 07/09/19 (from the past 48 hour(s))  Glucose, capillary     Status: Abnormal   Collection Time: 07/13/19 12:27 PM  Result Value Ref Range   Glucose-Capillary 158 (H) 70 - 99 mg/dL    Comment: Glucose reference range applies only to samples taken after fasting for at least 8 hours.  Glucose, capillary     Status: Abnormal   Collection Time: 07/13/19  8:19 PM  Result Value Ref Range   Glucose-Capillary 237 (H) 70 - 99 mg/dL    Comment: Glucose reference range applies only to samples taken after fasting for at least 8 hours.  CBC with Differential/Platelet     Status: Abnormal   Collection Time: 07/14/19  5:47 AM  Result Value Ref Range   WBC 8.9 4.0 - 10.5 K/uL   RBC 2.69 (L) 3.87 - 5.11 MIL/uL   Hemoglobin 7.2 (L) 12.0 - 15.0 g/dL   HCT 24.2 (L) 36.0 - 46.0 %   MCV 90.0 80.0 - 100.0 fL   MCH 26.8 26.0 - 34.0 pg   MCHC 29.8 (L) 30.0 - 36.0 g/dL   RDW 14.8 11.5 - 15.5 %   Platelets 403 (H) 150 - 400 K/uL   nRBC 0.0 0.0 - 0.2 %   Neutrophils Relative % 64 %   Neutro Abs 5.8 1.7 - 7.7 K/uL   Lymphocytes Relative 24 %   Lymphs  Abs 2.1 0.7 - 4.0 K/uL   Monocytes Relative 7 %   Monocytes Absolute 0.6 0.1 - 1.0 K/uL   Eosinophils Relative 3 %   Eosinophils Absolute 0.3 0.0 - 0.5 K/uL   Basophils Relative 1 %   Basophils Absolute 0.1 0.0 - 0.1 K/uL   Immature Granulocytes 1 %   Abs Immature Granulocytes 0.04 0.00 - 0.07 K/uL    Comment: Performed at [facility], [place] [address] [place], [place] [address]  Comprehensive metabolic panel     Status: Abnormal   Collection Time: 07/14/19  5:47 AM  Result Value Ref Range   Sodium 139 135 - 145 mmol/L   Potassium 3.7 3.5 - 5.1 mmol/L   Chloride 103 98 - 111 mmol/L   CO2 27 22 - 32 mmol/L   Glucose, Bld 212 (H) 70 - 99 mg/dL    Comment: Glucose reference range applies only to samples taken after fasting for at least 8 hours.   BUN 19 6 - 20 mg/dL   Creatinine, Ser 1.17 (H) 0.44 - 1.00 mg/dL   Calcium 8.8 (L) 8.9 - 10.3 mg/dL   Total Protein 6.3 (L) 6.5 - 8.1 g/dL   Albumin 2.7 (L) 3.5 - 5.0  g/dL   AST 10 (L) 15 - 41 U/L   ALT 10 0 - 44 U/L   Alkaline Phosphatase 78 38 - 126 U/L   Total Bilirubin 0.6 0.3 - 1.2 mg/dL   GFR calc non Af Amer 52 (L) >60 mL/min   GFR calc Af Amer >60 >60 mL/min   Anion gap 9 5 - 15    Comment: Performed at [facility], [place] [address] [place], [place] [address]  Magnesium     Status: None   Collection Time: 07/14/19  5:47 AM  Result Value Ref Range   Magnesium 1.7 1.7 - 2.4 mg/dL    Comment: Performed at [facility], [place] [address], [place], [place] [address]  Phosphorus     Status: None   Collection Time: 07/14/19  5:47 AM  Result Value Ref Range   Phosphorus 2.6 2.5 - 4.6 mg/dL    Comment: Performed at [facility], [place] [address] [place], [place] [address]  Glucose, capillary     Status: Abnormal   Collection Time: 07/14/19  7:38 AM  Result Value Ref Range   Glucose-Capillary 185 (H) 70 - 99 mg/dL    Comment: Glucose reference range applies only  to samples taken after fasting for at least 8 hours.  TSH     Status: None   Collection Time: 07/14/19 10:17 AM  Result Value Ref Range   TSH 1.546 0.350 - 4.500 uIU/mL    Comment: Performed by a 3rd Generation assay with a functional sensitivity of <=0.01 uIU/mL. Performed at [facility], [place] [address], [place], [place] [address]   T4, free     Status: Abnormal   Collection Time: 07/14/19 10:17 AM  Result Value Ref Range   Free T4 1.21 (H) 0.61 - 1.12 ng/dL    Comment: (NOTE) Biotin ingestion may interfere with free T4 tests. If the results are inconsistent with the TSH level, previous test results, or the clinical presentation, then consider biotin interference. If needed, order repeat testing after stopping biotin. Performed at [facility], [place] [address] [place], [place] [address]   Glucose, capillary     Status: Abnormal   Collection Time: 07/14/19 11:56 AM  Result Value Ref Range   Glucose-Capillary 206 (H) 70 - 99 mg/dL    Comment: Glucose reference range applies only to samples taken after fasting for at least 8 hours.  Glucose, capillary     Status: Abnormal   Collection Time: 07/14/19  3:58 PM  Result Value Ref Range   Glucose-Capillary 214 (H) 70 - 99 mg/dL    Comment: Glucose reference range applies only to samples taken after fasting for at least 8 hours.  Glucose, capillary     Status: Abnormal   Collection Time: 07/14/19  8:33 PM  Result Value Ref Range   Glucose-Capillary 230 (H) 70 - 99 mg/dL    Comment: Glucose reference range applies only to samples taken after fasting for at least 8 hours.  CBC with Differential/Platelet     Status: Abnormal   Collection Time: 07/15/19  6:00 AM  Result Value Ref Range   WBC 10.1 4.0 - 10.5 K/uL   RBC 2.81 (L) 3.87 - 5.11 MIL/uL   Hemoglobin 7.5 (L) 12.0 - 15.0 g/dL   HCT 25.0 (L) 36.0 - 46.0 %   MCV 89.0 80.0 - 100.0 fL   MCH 26.7 26.0 - 34.0 pg   MCHC 30.0 30.0 - 36.0 g/dL  RDW 14.7 11.5  - 15.5 %   Platelets 443 (H) 150 - 400 K/uL   nRBC 0.0 0.0 - 0.2 %   Neutrophils Relative % 70 %   Neutro Abs 7.2 1.7 - 7.7 K/uL   Lymphocytes Relative 19 %   Lymphs Abs 1.9 0.7 - 4.0 K/uL   Monocytes Relative 6 %   Monocytes Absolute 0.6 0.1 - 1.0 K/uL   Eosinophils Relative 3 %   Eosinophils Absolute 0.3 0.0 - 0.5 K/uL   Basophils Relative 1 %   Basophils Absolute 0.1 0.0 - 0.1 K/uL   Immature Granulocytes 1 %   Abs Immature Granulocytes 0.07 0.00 - 0.07 K/uL    Comment: Performed at [facility], [place] [address] [place], [place] [address]  Comprehensive metabolic panel     Status: Abnormal   Collection Time: 07/15/19  6:00 AM  Result Value Ref Range   Sodium 140 135 - 145 mmol/L   Potassium 3.6 3.5 - 5.1 mmol/L   Chloride 105 98 - 111 mmol/L   CO2 26 22 - 32 mmol/L   Glucose, Bld 220 (H) 70 - 99 mg/dL    Comment: Glucose reference range applies only to samples taken after fasting for at least 8 hours.   BUN 15 6 - 20 mg/dL   Creatinine, Ser 1.11 (H) 0.44 - 1.00 mg/dL   Calcium 9.1 8.9 - 10.3 mg/dL   Total Protein 6.4 (L) 6.5 - 8.1 g/dL   Albumin 2.8 (L) 3.5 - 5.0 g/dL   AST 10 (L) 15 - 41 U/L   ALT 10 0 - 44 U/L   Alkaline Phosphatase 77 38 - 126 U/L   Total Bilirubin 0.5 0.3 - 1.2 mg/dL   GFR calc non Af Amer 56 (L) >60 mL/min   GFR calc Af Amer >60 >60 mL/min   Anion gap 9 5 - 15    Comment: Performed at [facility], [place] [address] [place], [place] [address]  Phosphorus     Status: None   Collection Time: 07/15/19  6:00 AM  Result Value Ref Range   Phosphorus 2.9 2.5 - 4.6 mg/dL    Comment: Performed at [facility], [place] [address] [place], [place] [address]  Magnesium     Status: Abnormal   Collection Time: 07/15/19  6:00 AM  Result Value Ref Range   Magnesium 1.5 (L) 1.7 - 2.4 mg/dL    Comment: Performed at [facility], [place] [address], [place], [place] [address]  Glucose, capillary      Status: Abnormal   Collection Time: 07/15/19  7:46 AM  Result Value Ref Range   Glucose-Capillary 194 (H) 70 - 99 mg/dL    Comment: Glucose reference range applies only to samples taken after fasting for at least 8 hours.    Studies/Results: No results found.  Medications: I have reviewed the patient's current medications.  Assessment: Diabetic gastroparesis without much improvement in symptoms Blood sugar remains elevated at 220 Normocytic anemia, hemoglobin 7.5, mild thrombocytosis.  "We will treat Normal renal function  Plan: Patient is agreeable to taking Reglan 3 doses-I have discussed side effects including tardive dyskinesia and tremors, QT prolongation. Plan on giving her 10 mg IV every 8 hours x3 doses and then stop.  Meanwhile, continue erythromycin 250 mg 3 times daily with meals and bedtime along with Phenergan 25 mg IV every 6 hours as needed for nausea and vomiting  I have advised to stop  narcotics altogether, currently she is on a reduced dose of Dilaudid 0.5 mg IV every 8 hours as needed.  Ronnette Juniper, MD 07/15/2019, 11:34 AM

## 2019-07-16 LAB — CBC WITH DIFFERENTIAL/PLATELET
Abs Immature Granulocytes: 0.04 10*3/uL (ref 0.00–0.07)
Basophils Absolute: 0.1 10*3/uL (ref 0.0–0.1)
Basophils Relative: 1 %
Eosinophils Absolute: 0.3 10*3/uL (ref 0.0–0.5)
Eosinophils Relative: 3 %
HCT: 26.1 % — ABNORMAL LOW (ref 36.0–46.0)
Hemoglobin: 8 g/dL — ABNORMAL LOW (ref 12.0–15.0)
Immature Granulocytes: 0 %
Lymphocytes Relative: 25 %
Lymphs Abs: 2.7 10*3/uL (ref 0.7–4.0)
MCH: 27 pg (ref 26.0–34.0)
MCHC: 30.7 g/dL (ref 30.0–36.0)
MCV: 88.2 fL (ref 80.0–100.0)
Monocytes Absolute: 0.6 10*3/uL (ref 0.1–1.0)
Monocytes Relative: 6 %
Neutro Abs: 7.1 10*3/uL (ref 1.7–7.7)
Neutrophils Relative %: 65 %
Platelets: 466 10*3/uL — ABNORMAL HIGH (ref 150–400)
RBC: 2.96 MIL/uL — ABNORMAL LOW (ref 3.87–5.11)
RDW: 14.8 % (ref 11.5–15.5)
WBC: 10.8 10*3/uL — ABNORMAL HIGH (ref 4.0–10.5)
nRBC: 0 % (ref 0.0–0.2)

## 2019-07-16 LAB — COMPREHENSIVE METABOLIC PANEL
ALT: 9 U/L (ref 0–44)
AST: 9 U/L — ABNORMAL LOW (ref 15–41)
Albumin: 2.9 g/dL — ABNORMAL LOW (ref 3.5–5.0)
Alkaline Phosphatase: 81 U/L (ref 38–126)
Anion gap: 11 (ref 5–15)
BUN: 15 mg/dL (ref 6–20)
CO2: 28 mmol/L (ref 22–32)
Calcium: 9 mg/dL (ref 8.9–10.3)
Chloride: 99 mmol/L (ref 98–111)
Creatinine, Ser: 1.28 mg/dL — ABNORMAL HIGH (ref 0.44–1.00)
GFR calc Af Amer: 54 mL/min — ABNORMAL LOW (ref >60)
GFR calc non Af Amer: 47 mL/min — ABNORMAL LOW (ref >60)
Glucose, Bld: 182 mg/dL — ABNORMAL HIGH (ref 70–99)
Potassium: 3.2 mmol/L — ABNORMAL LOW (ref 3.5–5.1)
Sodium: 138 mmol/L (ref 135–145)
Total Bilirubin: 0.4 mg/dL (ref 0.3–1.2)
Total Protein: 6.7 g/dL (ref 6.5–8.1)

## 2019-07-16 LAB — MAGNESIUM: Magnesium: 1.4 mg/dL — ABNORMAL LOW (ref 1.7–2.4)

## 2019-07-16 LAB — GLUCOSE, CAPILLARY
Glucose-Capillary: 129 mg/dL — ABNORMAL HIGH (ref 70–99)
Glucose-Capillary: 276 mg/dL — ABNORMAL HIGH (ref 70–99)
Glucose-Capillary: 341 mg/dL — ABNORMAL HIGH (ref 70–99)
Glucose-Capillary: 342 mg/dL — ABNORMAL HIGH (ref 70–99)

## 2019-07-16 LAB — T3: T3, Total: 99 ng/dL (ref 71–180)

## 2019-07-16 LAB — PHOSPHORUS: Phosphorus: 3.2 mg/dL (ref 2.5–4.6)

## 2019-07-16 MED ORDER — POTASSIUM CHLORIDE CRYS ER 20 MEQ PO TBCR
40.0000 meq | EXTENDED_RELEASE_TABLET | Freq: Two times a day (BID) | ORAL | Status: AC
  Administered 2019-07-16 (×2): 40 meq via ORAL
  Filled 2019-07-16 (×2): qty 2

## 2019-07-16 MED ORDER — OXYCODONE HCL 5 MG PO TABS
5.0000 mg | ORAL_TABLET | Freq: Two times a day (BID) | ORAL | Status: DC | PRN
  Administered 2019-07-18 (×2): 5 mg via ORAL
  Filled 2019-07-16 (×2): qty 1

## 2019-07-16 MED ORDER — OXYCODONE HCL 5 MG PO TABS
5.0000 mg | ORAL_TABLET | Freq: Four times a day (QID) | ORAL | Status: DC | PRN
  Administered 2019-07-16 (×2): 5 mg via ORAL
  Filled 2019-07-16 (×2): qty 1

## 2019-07-16 MED ORDER — MAGNESIUM SULFATE 4 GM/100ML IV SOLN
4.0000 g | Freq: Once | INTRAVENOUS | Status: AC
  Administered 2019-07-16: 4 g via INTRAVENOUS
  Filled 2019-07-16: qty 100

## 2019-07-16 NOTE — Progress Notes (Signed)
[facility] Progress Note  Deborah [name] 54 y.o. 11-15-1965  CC:  Nausea/vomiting  Subjective: Patient just ate breakfast consisting of a bagel and scrambled eggs and currently feels fine, denies current nausea.  Last episode of emesis was yesterday, denies any hematemesis.  Refused third dose of Reglan, stating it isn't working and she is concerned about side effects.  Last bowel movement was yesterday.  Stool was loose but without any melena or hematochezia.  Patient interested in discharge today or tomorrow.  ROS : Review of Systems  Respiratory: Negative for cough and shortness of breath.   Cardiovascular: Negative for chest pain and palpitations.  Gastrointestinal: Positive for abdominal pain (mild, diffuse) and diarrhea. Negative for blood in stool, constipation, heartburn, melena, nausea and vomiting.    Objective: Vital signs in last 24 hours: Vitals:   07/15/19 2021 07/16/19 0543  BP: 124/72 (!) 143/76  Pulse: 92 94  Resp: 19 19  Temp: (!) 97.4 F (36.3 C) 97.9 F (36.6 C)  SpO2: 97% 97%    Physical Exam:  General:  Alert, cooperative, sitting in chair in no acute distress, morbidly obese  Head:  Normocephalic, without obvious abnormality, atraumatic  Eyes:  Anicteric sclera, EOMs intact,   Lungs:   Breathing comfortably on room air  Heart:  Regular rate and rhythm  Abdomen:   Soft, non-tender, normoactive bowel sounds,  no masses, no peritoneal signs  Extremities: Extremities normal, atraumatic, no  edema    Lab Results: Recent Labs    07/15/19 0600 07/16/19 0402  NA 140 138  K 3.6 3.2*  CL 105 99  CO2 26 28  GLUCOSE 220* 182*  BUN 15 15  CREATININE 1.11* 1.28*  CALCIUM 9.1 9.0  MG 1.5* 1.4*  PHOS 2.9 3.2   Recent Labs    07/15/19 0600 07/16/19 0402  AST 10* 9*  ALT 10 9  ALKPHOS 77 81  BILITOT 0.5 0.4  PROT 6.4* 6.7  ALBUMIN 2.8* 2.9*   Recent Labs    07/15/19 0600 07/16/19 0402  WBC 10.1 10.8*  NEUTROABS 7.2 7.1  HGB  7.5* 8.0*  HCT 25.0* 26.1*  MCV 89.0 88.2  PLT 443* 466*   No results for input(s): LABPROT, INR in the last 72 hours.  Assessment: Diabetic gastroparesis, nausea/vomiting improved after Reglan x 2 doses (patient refused third dose) Blood glucose remains elevated (182) Stable renal function: BUN 15/Cr 1.28  Mild hypokalemia, K 3.2 today  Normocytic anemia, stable, Hgb 8.0 today  Mild thrombocytosis, 466K today  Plan: Reglan discontinued.    Continue erythromycin 250 mg 3 times daily with meals and bedtime along with Phenergan 25 mg IV every 6 hours as needed for nausea and vomiting.  Blood glucose control.  Avoid any narcotic use.  Patient to eat small meals with low residue and low fiber.  If patient continues to tolerate diet, OK to discharge from GI standpoint.  [facility] will follow.  Salley Slaughter PA-C 07/16/2019, 9:43 AM  Contact #  [phone]

## 2019-07-16 NOTE — Progress Notes (Signed)
Inpatient Diabetes Program Recommendations  AACE/ADA: New Consensus Statement on Inpatient Glycemic Control (2015)  Target Ranges:  Prepandial:   less than 140 mg/dL      Peak postprandial:   less than 180 mg/dL (1-2 hours)      Critically ill patients:  140 - 180 mg/dL   Lab Results  Component Value Date   GLUCAP 129 (H) 07/16/2019   HGBA1C 8.4 (H) 07/10/2019    Review of Glycemic Control Results for SHAGUANA, LOVE (MRN [id number]) as of 07/16/2019 11:46  Ref. Range 07/15/2019 07:46 07/15/2019 11:49 07/15/2019 16:33 07/15/2019 20:24 07/16/2019 08:10  Glucose-Capillary Latest Ref Range: 70 - 99 mg/dL 194 (H) 194 (H) 330 (H) 196 (H) 129 (H)   Diabetes history: DM 2 Outpatient Diabetes medications:  Novolog 15 units tid with meals (pt. States he is taking Novolog 30 units tid with meals) Lantus 45 units q HS Current orders for Inpatient glycemic control:  Novolog moderate tid with meals, Lantus 25 units q HS  Inpatient Diabetes Program Recommendations:   Please consider adding Novolog 3 units tid with meals to prevent post prandial rises in blood sugars.   Thanks,  Adah Perl, RN, BC-ADM Inpatient Diabetes Coordinator Pager [phone] (8a-5p)

## 2019-07-16 NOTE — Plan of Care (Signed)
  Problem: Education: Goal: Knowledge of General Education information will improve Description: Including pain rating scale, medication(s)/side effects and non-pharmacologic comfort measures Outcome: Completed/Met   Problem: Health Behavior/Discharge Planning: Goal: Ability to manage health-related needs will improve Outcome: Progressing   Problem: Clinical Measurements: Goal: Ability to maintain clinical measurements within normal limits will improve Outcome: Progressing Goal: Diagnostic test results will improve Outcome: Progressing Goal: Respiratory complications will improve Outcome: Completed/Met Goal: Cardiovascular complication will be avoided Outcome: Completed/Met   Problem: Activity: Goal: Risk for activity intolerance will decrease Outcome: Progressing   Problem: Nutrition: Goal: Adequate nutrition will be maintained Outcome: Progressing   Problem: Elimination: Goal: Will not experience complications related to bowel motility Outcome: Completed/Met Goal: Will not experience complications related to urinary retention Outcome: Completed/Met   Problem: Pain Managment: Goal: General experience of comfort will improve Outcome: Progressing   Problem: Safety: Goal: Ability to remain free from injury will improve Outcome: Progressing   Problem: Skin Integrity: Goal: Risk for impaired skin integrity will decrease Outcome: Completed/Met

## 2019-07-16 NOTE — Care Management Important Message (Signed)
Important Message  Patient Details IM Letter given to Evette Cristal SW Case Manager to present to the Patient Name: Deborah [name] MRN: [id number] Date of Birth: 15-Jul-1965   Medicare Important Message Given:  Yes     Kerin Salen 07/16/2019, 10:40 AM

## 2019-07-16 NOTE — Progress Notes (Signed)
PROGRESS NOTE    Deborah [name]  IEP:[id number] DOB: [birth year] DOA: 07/09/2019 PCP: [name], DO  Brief Narrative:  HPI per Dr. Shela Leff on 07/10/19 Deborah [name] is a 54 y.o. female with medical history significant of chronic systolic CHF (EF 40 to 66% on echo done 01/2019), paroxysmal A. fib on Eliquis, insulin-dependent type 2 diabetes with history of recurrent admissions for DKA (most recent 4/23-4/28), CKD stage III, hypertension, hyperlipidemia, CVA, gout, diabetic gastroparesis intolerant to Reglan, gastric ulcer, GERD, anemia of chronic disease, chronic pain, depression, morbid obesity (BMI 54.07) presenting to the ED via EMS with complaints of nausea and vomiting.  Patient states yesterday afternoon she had a ham sandwich and soon after started vomiting.  She also had some substernal sharp chest pain and shortness of breath at that time.  She has continued to have 10 out of 10 intensity generalized abdominal pain since then.  Also continue to have some chest pain since then.  She has continued to vomit and has not been able to tolerate any p.o. intake.  Does report having a normal bowel movement in the afternoon.  She does not recall taking erythromycin since her recent hospital discharge.  States her blood glucose has been in the 200s at home.  ED Course: Afebrile.  Tachycardic and tachypneic on arrival.  Not hypoxic.  WBC count 18.6.  Lactic acid 3.2.  Hemoglobin 7.8, stable compared to labs done during recent hospitalization.  Platelet count 594, previously normal.  Potassium 2.6.  Magnesium level 0.7.  Bicarb 25, anion gap 17.  Blood glucose 119.  BUN 23, creatinine 1.5.  Creatinine was 1.1 at the time of recent hospital discharge.  Lipase and LFTs normal.  UA pending.  Blood ethanol level undetectable.  Blood culture x2 pending.  UDS pending.  SARS-CoV-2 PCR test negative.  Influenza panel negative. No imaging done. Patient received Dilaudid, 1 L LR bolus, IV  magnesium 2 g, IV potassium supplementation, and Zofran.  **Interim History Continues to have some nausea vomiting and abdominal pain so her diet was slowly advanced but she has not been very compliant with her diet.  Gastroenterology was consulted for further evaluation recommendations and she states that she ate half of her burger the day before yesterday but continues to have some nausea and vomiting and states that she is having some loose watery bowel movements..  She continues to have small amount of emesis and GI continues to recommend low fiber, low residue diet and small frequent meals.    GI had recommended some Reglan but patient wants avoid the side effects of tardive dyskinesia and tremors but today she is amenable to taking 3 doses.  GI is scheduled her on 10 mg IV every 8 for 3 doses and then stopping follow-up continuing the erythromycin to 50 mg p.o. 3 times daily with meals and bedtime along with Phenergan 25 mg IV every 6 as needed for nausea vomiting  GI also recommended a nutritionist consultation and recommends avoiding narcotic usage and we have tried to wean this back today and cut her back from 1 mg of Dilaudid every 4 hours to 0.5  milligram every 8 hours yesterday with the hopes of getting her off of it any narcotic medications and will stop it today.  Dietitian spoke with the patient but patient verbalized understanding and did not really want to talk and so we will expect full compliance with her diet education.  Appreciate GIs assistance.  We have gotten her  off of the IV pain medication however there is no real good other options so we will put her back on oxycodone low-dose 5 mg every 12 hours.  She continues to have some nausea or vomiting and tolerated the Reglan but still feels about the same and continues to complain about significant amount of abdominal pain.  Assessment & Plan:   Principal Problem:   Intractable nausea and vomiting Active Problems:    Hypokalemia   Leukocytosis   Abdominal pain   Lactic acidosis  Abdominal pain, intractable nausea and vomiting in the setting of Diabetic Gastroparesis  -Patient presenting with acute onset nausea/vomiting and abdominal pain.  Suspicion for diabetic gastroparesis.   -Patient is afebrile, but had elevated WBC count of 16.2 and a lactic acid of 3.2 on admission; now WBC is 10.1 -Lipase and LFTs within normal limits as AST is 10, ALT is 10  -CT Abd/Pelvis showed "No acute finding including bowel obstruction or visible inflammation.  Chronic findings are stable from CT 1 month ago and described above." -Previous gastric emptying study 2018 that was unrevealing -N.p.o initially but now on a CLD and will attempt Soft Diet and she is now on a soft diet but has not been compliant with a low fiber, low residue diet -NS at 75 mL's per hour now stopped  -Started on Erythromycin 250 mg p.o. 3 times daily with meals and nightly and will continue -C/w Supportive care, Antiemetics with Promethazine 25 mg IV q6hprn Nausea and Vomiting  -C/w PPI with Pantoprazole 40 mg po BID  -Judicious use of Narcotic Medications; Currently has Hydromorphone 1 mg IV q4hprn Severe Pain and we have reduced this to half a milligram every 8 hours as needed for severe pain and this is now been weaned off and stopped -Consulted gastroenterology for further evaluation and recommendations appreciate their evaluation -GI recommended some recommended above patient wants to avoid it due to the side effect of tardive dyskinesia and tremors.  The day before yesterday she is having her first solid meal since admission but she continues to have a small amount of emesis and has been nauseous and has not been compliant with her diet.  Gastroenterology recommending continuing low fiber, low residue diet and small frequent meals but patient does not want this.  She had scrambled eggs this morning but has been seen to order high carb meals;  dietitian was consulted for diet education and will continue to try and control her blood sugars carefully and avoid narcotics -CBG's had been better controlled but now ranging from 129-341 -continue supportive management given that this is slow to improve and because she is noncompliant.;  Still complains of abdominal pain despite the fact that she wants to continue to have burgers and chicken -GI has stress and urge that food with low fiber and more liquid consistency would be preferred with gastroparesis but patient does not want to try this and she does not want her erythema sent to be increased and is currently to 50 mg p.o. 3 times daily with meals and bedtime -Gastroenterology has spoke to her about Reglan and she is now amenable and so they will be giving her 10 mg IV every 8 hours for 3 doses and then stopping to see if this helps and it initially did but she had some nausea vomiting yesterday -She clinically has been about the same the last few days with intermittent nausea vomiting as well as abdominal pain. -Very slow to respond but improved  Atypical  Chest Pain, improved -Improved initially but was having chest pain again and radiated into her shoulders -Initial EKG without acute ischemic changes.  High-sensitivity troponin 30 and trended down to 23 -Continue to trend troponin and that she is having it again and repeat troponin this morning was 26 and after that was 22 -Repeat EKG yesterday AM showed a normal sinus rhythm at a rate of 94 with a QTC of 460 with no evidence of any ischemic changes on my interpretation -Replete Electrolytes -Continue to monitor on telemetry  Leukocytosis, improved initially but now slightly worsened -Etiology likely reactive from dehydration.   -Unlikely infectious etiology with normal procalcitonin.   -No acute findings on CT abdomen/pelvis.  -Patient's WBC is now 10.8 -Urinalysis Still pending but UDS was Negative  -Blood cultures x2: NGTD at 3  days -Continue to monitor CBC daily and continue to evaluate carefully  Lactic Acidosis -Likely secondary to dehydration.   -Unlikely infectious process.   -Procalcitonin less than 0.10. -Blood ethanol level undetectable.    -CT abdomen/pelvis negative. -Lactic Acid 3.2--> 2.1 --> 1.2 -IV fluid hydration is now stopped as she is improved and lactic acid level is now normalized  Hypokalemia -Likely related to decreased p.o. intake from intractable nausea/vomiting and home diuretic use.  -Severe hypomagnesemia also likely contributing (0.7 on admission) -K+ this AM was 3.2 -Replete with p.o. potassium chloride 40 mg twice daily x2 doses -EKG without acute changes.  -Continue to hold home Torsemide -Continue to monitor electrolytes daily  Severe Hypomagnesemia -Improved.  Patient's magnesium went from 0.7 is now 1.4 -Replete With IV mag sulfate 4 g -Continue to monitor and replete as necessary -Repeat magnesium level in the a.m.  AKI on CKD stage III:  -BUN 23, creatinine 1.5, creatinine was 1.1 at the time of recent hospital discharge.  -Suspect prerenal from dehydration and home diuretic use.  -Continue holding home diuretic with torsemide -Continued NS at 75 mL's per hour but this is now stopped -Patient's BUN/creatinine went from 20/1.11 -> 21/1.31 -> 21/1.27 -> 23/1.53  and in the setting of emesis and may need to go back on IV fluids if she continues to worsen but it is improved and is now 15/1.11 -> 15/1.28 -Avoid nephrotoxic medications, contrast dyes, hypotension and renally adjust medications -Repeat CMP in a.m.  Poorly controlled Insulin-Dependent Type 2 Diabetes  -Hemoglobin A1c A1c 8.4.   -Home regimen includes Lantus 45 units subcutaneously daily, NovoLog 15 subcutaneously 3 times daily AC. -Diabetic educator following, appreciate assistance -Restart Lantus at a lower dose, 25 units of insulin daily -C/w Insulin sliding scale for coverage with moderate  NovoLog/scale AC -CBG's elevated and ranging from 129-341 now  Hypertension -Resumed her home hydralazine 25 mg p.o. 3 times daily and will continue metoprolol 25 mg p.o. daily  CAD -Continue with metoprolol succinate 25 mg p.o. daily, nitroglycerin 0.4 mg every 5 minutes, atorvastatin 10 mg p.o. daily, hydralazine, 25 mg p.o. 3 times daily, as well as isosorbide mononitrate  Chronic Systolic CHF -EF 40 to 56% on TTE 01/2019. -No signs of volume overload at this time. -Resumed her home torsemide today -Continue Metoprolol Succinate 25 mg p.o. daily -Resumed her hydralazine and Imdur combination today -Continue to monitor volume status carefully and continue to monitor for signs and symptoms of volume overload -Strict I's and O's and daily weights; She is + 4.035 liters since admission and Weight was not done and was 335 and is now 331  Paroxysmal Atrial Fibrilliation -Currently in sinus rhythm.  Continue home Metoprolol XL 25 mg po Daily and Apixaban 5 mg po BID -Checking TSH  GERD -Continue PPI with Pantoprazole 40 mg po BID  Anemia of chronic disease -Patient's hemoglobin/hematocrit went from 8.6/20.5 is now 7.2/24.2 -Anemia panel done on 06/26/2019 showed an iron level of 18, U IBC of 327, TIBC of 345, saturation ratios of 5%, ferritin level 48, folate level of 6.6, and vitamin B12 level of 251 -Continue to monitor for signs and symptoms of bleeding as she is anticoagulated with Apixaban; currently no overt bleeding noted -Repeat CBC in a.m.  Hyperlipidemia  -Resumed her atorvastatin 10 mg p.o. daily  Depression -Remained stable.  Patient maintained on home regimen of antidepressant medication with Duloxetine 40 mg po BID  Super Morbid Obesity -Estimated body mass index is 53.42 kg/m as calculated from the following:   Height as of this encounter: 5\' 6"  (1.676 m).   Weight as of this encounter: 150.1 kg. -Weight Loss and Dietary Counseling given    Thrombocytosis -Patient's platelet count went from 361 is now 403 and is further worsened to 466 -Continue to monitor and trend and repeat CBC in the AM  DVT prophylaxis: Anticoagulated with Apixaban 5 mg po BID  Code Status: FULL CODE  Family Communication: No family present at bedside  Disposition Plan: Patient is from home likely to be discharged home when she is able to tolerate a diet without abdominal pain or nausea but she has not been very compliant with her diet.  Gastroenterology has been consulted given her suspected diabetic gastroparesis.  We have also consulted PT OT to further evaluate and treat and she has no follow-up.  She is now going to be started on IV Reglan  Status is: Inpatient  Remains inpatient appropriate because:Persistent severe electrolyte disturbances, Ongoing active pain requiring inpatient pain management and Inpatient level of care appropriate due to severity of illness; continues to have abdominal pain and nausea vomiting and p.o. intake has been intermittent   Dispo: The patient is from: Home              Anticipated d/c is to: Home              Anticipated d/c date is: 1 day              Patient currently is not medically stable to d/c.  And will need GI clearance prior to discharge and she is now going to be started on IV Reglan yesterday and tolerated but continues to have some nausea vomiting   Consultants:   Gastroenterology    Procedures: None   Antimicrobials:  Anti-infectives (From admission, onward)   Start     Dose/Rate Route Frequency Ordered Stop   07/10/19 0800  erythromycin (E-MYCIN) tablet 250 mg     250 mg Oral 3 times daily with meals & bedtime 07/10/19 0342       Subjective: Seen and examined at bedside and states that she is doing a little bit better but continues to have abdominal pain and she vomited yesterday.  No lightheadedness or dizziness.  Denies any chest pain.  No other concerns or complaints this  time  Objective: Vitals:   07/15/19 2021 07/16/19 0543 07/16/19 1231 07/16/19 1414  BP: 124/72 (!) 143/76 (!) 99/57 123/74  Pulse: 92 94 100 98  Resp: 19 19 16    Temp: (!) 97.4 F (36.3 C) 97.9 F (36.6 C) (!) 97.5 F (36.4 C)   TempSrc: Oral Oral Oral  SpO2: 97% 97% 98% 100%  Weight:      Height:        Intake/Output Summary (Last 24 hours) at 07/16/2019 1538 Last data filed at 07/16/2019 1413 Gross per 24 hour  Intake 1120 ml  Output --  Net 1120 ml   Filed Weights   07/13/19 0625 07/14/19 0532 07/15/19 0705  Weight: (!) 152.8 kg (!) 150.8 kg (!) 150.1 kg   Examination: Physical Exam:  Constitutional: WN/WD African-American female sitting up in the chair appears to be in no acute distress but is a little comfortable complaining some abdominal pain Eyes: Lids and conjunctivae normal, sclerae anicteric; her eyeballs are protuberant ENMT: External Ears, Nose appear normal. Grossly normal hearing.  Has some tardive dyskinesia features Neck: Appears normal, supple, no cervical masses, normal ROM, no appreciable thyromegaly neck: No JVD Respiratory: Diminished to auscultation bilaterally, no wheezing, rales, rhonchi or crackles. Normal respiratory effort and patient is not tachypenic. No accessory muscle use.  Unlabored breathing Cardiovascular: RRR, no murmurs / rubs / gallops. S1 and S2 auscultated.  Trace extremity edema.  Abdomen: Soft, tender to palpate, distended secondary to body habitus. No masses palpated. No appreciable hepatosplenomegaly. Bowel sounds positive.  GU: Deferred. Musculoskeletal: No clubbing / cyanosis of digits/nails. No joint deformity upper and lower extremities.  Skin: No rashes, lesions, ulcers on limited skin evaluation. No induration; Warm and dry.  Neurologic: CN 2-12 grossly intact with no focal deficits. Romberg sign cerebellar reflexes not assessed.  Psychiatric: Normal judgment and insight. Alert and oriented x 3. Normal mood and appropriate  affect.   Data Reviewed: I have personally reviewed following labs and imaging studies  CBC: Recent Labs  Lab 07/12/19 0457 07/13/19 0436 07/14/19 0547 07/15/19 0600 07/16/19 0402  WBC 15.3* 10.1 8.9 10.1 10.8*  NEUTROABS 12.5* 6.6 5.8 7.2 7.1  HGB 7.1* 7.0* 7.2* 7.5* 8.0*  HCT 24.0* 23.5* 24.2* 25.0* 26.1*  MCV 90.6 90.0 90.0 89.0 88.2  PLT 374 361 403* 443* 654*   Basic Metabolic Panel: Recent Labs  Lab 07/12/19 0457 07/13/19 0436 07/14/19 0547 07/15/19 0600 07/16/19 0402  NA 135 136 139 140 138  K 3.7 3.8 3.7 3.6 3.2*  CL 99 102 103 105 99  CO2 26 26 27 26 28   GLUCOSE 234* 194* 212* 220* 182*  BUN 21* 23* 19 15 15   CREATININE 1.27* 1.53* 1.17* 1.11* 1.28*  CALCIUM 7.8* 8.6* 8.8* 9.1 9.0  MG 1.9 2.0 1.7 1.5* 1.4*  PHOS 2.6 3.2 2.6 2.9 3.2   GFR: Estimated Creatinine Clearance: 74.9 mL/min (A) (by C-G formula based on SCr of 1.28 mg/dL (H)). Liver Function Tests: Recent Labs  Lab 07/12/19 0457 07/13/19 0436 07/14/19 0547 07/15/19 0600 07/16/19 0402  AST 10* 8* 10* 10* 9*  ALT 8 9 10 10 9   ALKPHOS 84 75 78 77 81  BILITOT 0.6 0.6 0.6 0.5 0.4  PROT 6.2* 6.2* 6.3* 6.4* 6.7  ALBUMIN 2.7* 2.6* 2.7* 2.8* 2.9*   Recent Labs  Lab 07/09/19 2249  LIPASE 24   No results for input(s): AMMONIA in the last 168 hours. Coagulation Profile: No results for input(s): INR, PROTIME in the last 168 hours. Cardiac Enzymes: No results for input(s): CKTOTAL, CKMB, CKMBINDEX, TROPONINI in the last 168 hours. BNP (last 3 results) No results for input(s): PROBNP in the last 8760 hours. HbA1C: No results for input(s): HGBA1C in the last 72 hours. CBG: Recent Labs  Lab 07/15/19 1149 07/15/19 1633 07/15/19 2024 07/16/19 0810 07/16/19 1147  GLUCAP 194* 330* 196* 129* 276*   Lipid Profile: No results for input(s): CHOL, HDL, LDLCALC, TRIG, CHOLHDL, LDLDIRECT in the last 72 hours. Thyroid Function Tests: Recent Labs    07/14/19 1017  TSH 1.546  FREET4 1.21*    Anemia Panel: No results for input(s): VITAMINB12, FOLATE, FERRITIN, TIBC, IRON, RETICCTPCT in the last 72 hours. Sepsis Labs: Recent Labs  Lab 07/10/19 0119 07/10/19 0454 07/11/19 0459  PROCALCITON  --  <0.10  --   LATICACIDVEN 3.2* 2.1* 1.2    Recent Results (from the past 240 hour(s))  Respiratory Panel by RT PCR (Flu A&B, Covid) - Nasopharyngeal Swab     Status: None   Collection Time: 07/10/19  1:19 AM   Specimen: Nasopharyngeal Swab  Result Value Ref Range Status   SARS Coronavirus 2 by RT PCR NEGATIVE NEGATIVE Final    Comment: (NOTE) SARS-CoV-2 target nucleic acids are NOT DETECTED. The SARS-CoV-2 RNA is generally detectable in upper respiratoy specimens during the acute phase of infection. The lowest concentration of SARS-CoV-2 viral copies this assay can detect is 131 copies/mL. A negative result does not preclude SARS-Cov-2 infection and should not be used as the sole basis for treatment or other patient management decisions. A negative result may occur with  improper specimen collection/handling, submission of specimen other than nasopharyngeal swab, presence of viral mutation(s) within the areas targeted by this assay, and inadequate number of viral copies (<131 copies/mL). A negative result must be combined with clinical observations, patient history, and epidemiological information. The expected result is Negative. Fact Sheet for Patients:  PinkCheek.be Fact Sheet for Healthcare Providers:  GravelBags.it This test is not yet ap proved or cleared by the [place] FDA and  has been authorized for detection and/or diagnosis of SARS-CoV-2 by FDA under an Emergency Use Authorization (EUA). This EUA will remain  in effect (meaning this test can be used) for the duration of the COVID-19 declaration under Section 564(b)(1) of the Act, 21 U.S.C. section 360bbb-3(b)(1), unless the authorization is  terminated or revoked sooner.    Influenza A by PCR NEGATIVE NEGATIVE Final   Influenza B by PCR NEGATIVE NEGATIVE Final    Comment: (NOTE) The Xpert Xpress SARS-CoV-2/FLU/RSV assay is intended as an aid in  the diagnosis of influenza from Nasopharyngeal swab specimens and  should not be used as a sole basis for treatment. Nasal washings and  aspirates are unacceptable for Xpert Xpress SARS-CoV-2/FLU/RSV  testing. Fact Sheet for Patients: PinkCheek.be Fact Sheet for Healthcare Providers: GravelBags.it This test is not yet approved or cleared by the [place] FDA and  has been authorized for detection and/or diagnosis of SARS-CoV-2 by  FDA under an Emergency Use Authorization (EUA). This EUA will remain  in effect (meaning this test can be used) for the duration of the  Covid-19 declaration under Section 564(b)(1) of the Act, 21  U.S.C. section 360bbb-3(b)(1), unless the authorization is  terminated or revoked. Performed at [facility], [place] [address] [place], [place] [address]   Culture, blood (routine x 2)     Status: None   Collection Time: 07/10/19  1:19 AM   Specimen: BLOOD  Result Value Ref Range Status   Specimen Description   Final    BLOOD BLOOD RIGHT FOREARM Performed at [facility] [address], [place], [place] [address]    Special Requests   Final    BOTTLES DRAWN AEROBIC AND ANAEROBIC Blood Culture adequate volume Performed at [facility]  [facility]ospital, [place] [address], [place], [place] [address]    Culture   Final    NO GROWTH 5 DAYS Performed at [facility], [place] [address] [place], [place] [address]    Report Status 07/15/2019 FINAL  Final  Culture, blood (routine x 2)     Status: None   Collection Time: 07/10/19  4:54 AM   Specimen: BLOOD  Result Value Ref Range Status   Specimen Description   Final    BLOOD BLOOD LEFT FOREARM Performed  at [facility] [address], [place], [place] [address]    Special Requests   Final    BOTTLES DRAWN AEROBIC AND ANAEROBIC Blood Culture adequate volume Performed at [facility] [address] [place], [place] [address]    Culture   Final    NO GROWTH 5 DAYS Performed at [facility], [place] [address] [place], [place] [address]    Report Status 07/15/2019 FINAL  Final    RN Pressure Injury Documentation:     Estimated body mass index is 53.42 kg/m as calculated from the following:   Height as of this encounter: 5\' 6"  (1.676 m).   Weight as of this encounter: 150.1 kg.  Malnutrition Type:      Malnutrition Characteristics:      Nutrition Interventions:    Radiology Studies: No results found. Scheduled Meds: . apixaban  5 mg Oral BID  . atorvastatin  10 mg Oral Daily  . DULoxetine  40 mg Oral BID  . erythromycin  250 mg Oral TID WC & HS  . hydrALAZINE  25 mg Oral TID  . insulin aspart  0-15 Units Subcutaneous TID WC  . insulin glargine  25 Units Subcutaneous QHS  . isosorbide mononitrate  60 mg Oral Daily  . loratadine  10 mg Oral Daily  . magnesium oxide  400 mg Oral BID  . metoprolol succinate  25 mg Oral Daily  . pantoprazole  40 mg Oral BID  . potassium chloride  40 mEq Oral BID  . torsemide  40 mg Oral Daily   Continuous Infusions:   LOS: 6 days   Kerney Elbe, DO [facility] PAGER is on AMION  If 7PM-7AM, please contact night-coverage [email or web address]

## 2019-07-17 ENCOUNTER — Inpatient Hospital Stay (HOSPITAL_COMMUNITY)
Admit: 2019-07-17 | Discharge: 2019-07-17 | Disposition: A | Discharge status: Home / Self Care | Payer: Medicare Other | Attending: Internal Medicine | Admitting: Internal Medicine

## 2019-07-17 DIAGNOSIS — R251 Tremor, unspecified: Secondary | ICD-10-CM

## 2019-07-17 DIAGNOSIS — G243 Spasmodic torticollis: Secondary | ICD-10-CM

## 2019-07-17 DIAGNOSIS — G249 Dystonia, unspecified: Secondary | ICD-10-CM

## 2019-07-17 LAB — CBC WITH DIFFERENTIAL/PLATELET
Abs Immature Granulocytes: 0.06 10*3/uL (ref 0.00–0.07)
Basophils Absolute: 0.1 10*3/uL (ref 0.0–0.1)
Basophils Relative: 1 %
Eosinophils Absolute: 0.3 10*3/uL (ref 0.0–0.5)
Eosinophils Relative: 3 %
HCT: 25.1 % — ABNORMAL LOW (ref 36.0–46.0)
Hemoglobin: 7.6 g/dL — ABNORMAL LOW (ref 12.0–15.0)
Immature Granulocytes: 1 %
Lymphocytes Relative: 25 %
Lymphs Abs: 2.5 10*3/uL (ref 0.7–4.0)
MCH: 26.6 pg (ref 26.0–34.0)
MCHC: 30.3 g/dL (ref 30.0–36.0)
MCV: 87.8 fL (ref 80.0–100.0)
Monocytes Absolute: 0.6 10*3/uL (ref 0.1–1.0)
Monocytes Relative: 6 %
Neutro Abs: 6.5 10*3/uL (ref 1.7–7.7)
Neutrophils Relative %: 64 %
Platelets: 425 10*3/uL — ABNORMAL HIGH (ref 150–400)
RBC: 2.86 MIL/uL — ABNORMAL LOW (ref 3.87–5.11)
RDW: 14.6 % (ref 11.5–15.5)
WBC: 10 10*3/uL (ref 4.0–10.5)
nRBC: 0 % (ref 0.0–0.2)

## 2019-07-17 LAB — COMPREHENSIVE METABOLIC PANEL
ALT: 11 U/L (ref 0–44)
AST: 12 U/L — ABNORMAL LOW (ref 15–41)
Albumin: 2.9 g/dL — ABNORMAL LOW (ref 3.5–5.0)
Alkaline Phosphatase: 94 U/L (ref 38–126)
Anion gap: 12 (ref 5–15)
BUN: 20 mg/dL (ref 6–20)
CO2: 26 mmol/L (ref 22–32)
Calcium: 8.4 mg/dL — ABNORMAL LOW (ref 8.9–10.3)
Chloride: 98 mmol/L (ref 98–111)
Creatinine, Ser: 1.94 mg/dL — ABNORMAL HIGH (ref 0.44–1.00)
GFR calc Af Amer: 33 mL/min — ABNORMAL LOW (ref >60)
GFR calc non Af Amer: 28 mL/min — ABNORMAL LOW (ref >60)
Glucose, Bld: 273 mg/dL — ABNORMAL HIGH (ref 70–99)
Potassium: 3.1 mmol/L — ABNORMAL LOW (ref 3.5–5.1)
Sodium: 136 mmol/L (ref 135–145)
Total Bilirubin: 0.4 mg/dL (ref 0.3–1.2)
Total Protein: 6.5 g/dL (ref 6.5–8.1)

## 2019-07-17 LAB — GLUCOSE, CAPILLARY
Glucose-Capillary: 203 mg/dL — ABNORMAL HIGH (ref 70–99)
Glucose-Capillary: 188 mg/dL — ABNORMAL HIGH (ref 70–99)
Glucose-Capillary: 223 mg/dL — ABNORMAL HIGH (ref 70–99)
Glucose-Capillary: 317 mg/dL — ABNORMAL HIGH (ref 70–99)

## 2019-07-17 LAB — MAGNESIUM: Magnesium: 2.1 mg/dL (ref 1.7–2.4)

## 2019-07-17 LAB — PHOSPHORUS: Phosphorus: 4.2 mg/dL (ref 2.5–4.6)

## 2019-07-17 MED ORDER — INSULIN ASPART 100 UNIT/ML ~~LOC~~ SOLN
3.0000 [IU] | Freq: Three times a day (TID) | SUBCUTANEOUS | Status: DC
  Administered 2019-07-18 – 2019-07-19 (×5): 3 [IU] via SUBCUTANEOUS

## 2019-07-17 MED ORDER — ALPRAZOLAM 1 MG PO TABS
1.0000 mg | ORAL_TABLET | Freq: Three times a day (TID) | ORAL | Status: DC | PRN
  Administered 2019-07-17 – 2019-07-19 (×6): 1 mg via ORAL
  Filled 2019-07-17 (×6): qty 1

## 2019-07-17 MED ORDER — SODIUM CHLORIDE 0.9 % IV SOLN: INTRAVENOUS | Status: AC

## 2019-07-17 MED ORDER — SODIUM CHLORIDE 0.9% FLUSH: 10.0000 mL | INTRAVENOUS | Status: DC | PRN

## 2019-07-17 MED ORDER — ONDANSETRON HCL 4 MG/2ML IJ SOLN
4.0000 mg | Freq: Four times a day (QID) | INTRAMUSCULAR | Status: DC | PRN
  Filled 2019-07-17: qty 2

## 2019-07-17 MED ORDER — INSULIN GLARGINE 100 UNIT/ML ~~LOC~~ SOLN
30.0000 [IU] | Freq: Every day | SUBCUTANEOUS | Status: DC
  Administered 2019-07-17 – 2019-07-18 (×2): 30 [IU] via SUBCUTANEOUS
  Filled 2019-07-17 (×2): qty 0.3

## 2019-07-17 MED ORDER — DIPHENHYDRAMINE HCL 50 MG/ML IJ SOLN
25.0000 mg | Freq: Once | INTRAMUSCULAR | Status: AC
  Administered 2019-07-17: 25 mg via INTRAVENOUS
  Filled 2019-07-17: qty 1

## 2019-07-17 MED ORDER — POTASSIUM CHLORIDE CRYS ER 20 MEQ PO TBCR
40.0000 meq | EXTENDED_RELEASE_TABLET | Freq: Two times a day (BID) | ORAL | Status: AC
  Administered 2019-07-17 (×2): 40 meq via ORAL
  Filled 2019-07-17 (×2): qty 2

## 2019-07-17 NOTE — Progress Notes (Signed)
Inpatient Diabetes Program Recommendations  AACE/ADA: New Consensus Statement on Inpatient Glycemic Control (2015)  Target Ranges:  Prepandial:   less than 140 mg/dL      Peak postprandial:   less than 180 mg/dL (1-2 hours)      Critically ill patients:  140 - 180 mg/dL   Lab Results  Component Value Date   GLUCAP 188 (H) 07/17/2019   HGBA1C 8.4 (H) 07/10/2019    Review of Glycemic Control Results for Deborah [name], Deborah [name] (MRN [id number]) as of 07/17/2019 13:11  Ref. Range 07/16/2019 16:43 07/16/2019 20:57 07/17/2019 07:32 07/17/2019 10:53  Glucose-Capillary Latest Ref Range: 70 - 99 mg/dL 341 (H) 342 (H) 203 (H) 188 (H)  Diabetes history: DM 2 Outpatient Diabetes medications:  Novolog 15 units tid with meals (pt. States she is taking Novolog 30 units tid with meals) Lantus 45 units q HS Current orders for Inpatient glycemic control:  Novolog moderate tid with meals, Lantus 25 units q HS  Inpatient Diabetes Program Recommendations:   -Please consider adding Novolog 3 units tid with meals to prevent post prandial rises in blood sugars (hold if patient eats less than 50% or NPO). -Consider increasing Lantus to 30 units q HS.   Thanks  Adah Perl, RN, BC-ADM Inpatient Diabetes Coordinator Pager [phone] (8a-5p)

## 2019-07-17 NOTE — Progress Notes (Signed)
Called to speak with nurse to see if pt is available for EEG. Let RN know it will be early afternoon before I get to the pt.

## 2019-07-17 NOTE — Progress Notes (Signed)
[facility] Progress Note  Deborah [name] 54 y.o. 12-03-1965  CC:  Nausea/vomiting  Subjective: Patient reports tremors that started last night and are continuing into today.  She states she has a history of tremors that occur intermittently, last occurring 1 year ago.  She often takes Xanax to alleviate the tremors.  She reports 1 episode of emesis yesterday.  She continues to have looser stools on erythromycin but denies any melena or hematochezia.  ROS : Review of Systems  Respiratory: Negative for cough and shortness of breath.   Cardiovascular: Negative for chest pain and palpitations.  Gastrointestinal: Positive for abdominal pain (mild, diffuse) and diarrhea. Negative for blood in stool, constipation, heartburn, melena, nausea and vomiting.  Neurological: Positive for tremors.    Objective: Vital signs in last 24 hours: Vitals:   07/17/19 0538 07/17/19 1057  BP: (!) 116/54 (!) 146/84  Pulse: 82 (!) 102  Resp: 18 (!) 31  Temp: (!) 97.5 F (36.4 C) 98.1 F (36.7 C)  SpO2: 99% 98%    Physical Exam:  General:  Alert, cooperative, sitting in chair in no acute distress, morbidly obese; + tremors  Head:  Normocephalic, without obvious abnormality, atraumatic  Eyes:  Anicteric sclera, EOMs intact  Lungs:   Breathing comfortably on room air  Heart:  Regular rate and rhythm  Abdomen:   Soft, non-tender, normoactive bowel sounds,  no masses, no peritoneal signs  Extremities: Extremities normal, atraumatic, no  edema    Lab Results: Recent Labs    07/16/19 0402 07/17/19 0505  NA 138 136  K 3.2* 3.1*  CL 99 98  CO2 28 26  GLUCOSE 182* 273*  BUN 15 20  CREATININE 1.28* 1.94*  CALCIUM 9.0 8.4*  MG 1.4* 2.1  PHOS 3.2 4.2   Recent Labs    07/16/19 0402 07/17/19 0505  AST 9* 12*  ALT 9 11  ALKPHOS 81 94  BILITOT 0.4 0.4  PROT 6.7 6.5  ALBUMIN 2.9* 2.9*   Recent Labs    07/16/19 0402 07/17/19 0505  WBC 10.8* 10.0  NEUTROABS 7.1 6.5  HGB  8.0* 7.6*  HCT 26.1* 25.1*  MCV 88.2 87.8  PLT 466* 425*   No results for input(s): LABPROT, INR in the last 72 hours.  Assessment: Diabetic gastroparesis, nausea/vomiting improved after Reglan x 2 doses.  On erythromycin.  Blood glucose remains elevated (188-273 today)  Tremors: unclear if related to anxiety.  Less likely but possibly related to Reglan (x2 doses on 5/9)  Mildly decreased renal function )BUN 20/ Cr 1.94 today as compared to BUN 15/ Cr 1.28 yesterday)  Mild hypokalemia, K 3.1 today  Normocytic anemia, stable, Hgb 7.6 today  Mild thrombocytosis, 425K today  Plan: Continue erythromycin 250 mg 3 times daily with meals and bedtime along with Phenergan 25 mg IV every 6 hours as needed for nausea and vomiting.  We rediscussed importance of low residue diet with small portions.  Blood glucose control.  Avoid narcotic use.  [facility] will follow.  Salley Slaughter PA-C 07/17/2019, 12:42 PM  Contact #  [phone]

## 2019-07-17 NOTE — Progress Notes (Signed)
Arrived to room pt is getting lines placed

## 2019-07-17 NOTE — Progress Notes (Signed)
Fine facial tremors noted this afternoon. No stuttering or stammering in speech. Dr. Alfredia Ferguson made aware. Xanax given per MD.

## 2019-07-17 NOTE — Progress Notes (Signed)
EEG complete - results pending 

## 2019-07-17 NOTE — Progress Notes (Signed)
PROGRESS NOTE    Deborah [name]  VHQ:[id number] DOB: May 13, 1965 DOA: 07/09/2019 PCP: [name], DO  Brief Narrative:  HPI per Dr. Shela Leff on 07/10/19 Deborah [name] is a 54 y.o. female with medical history significant of chronic systolic CHF (EF 40 to 41% on echo done 01/2019), paroxysmal A. fib on Eliquis, insulin-dependent type 2 diabetes with history of recurrent admissions for DKA (most recent 4/23-4/28), CKD stage III, hypertension, hyperlipidemia, CVA, gout, diabetic gastroparesis intolerant to Reglan, gastric ulcer, GERD, anemia of chronic disease, chronic pain, depression, morbid obesity (BMI 54.07) presenting to the ED via EMS with complaints of nausea and vomiting.  Patient states yesterday afternoon she had a ham sandwich and soon after started vomiting.  She also had some substernal sharp chest pain and shortness of breath at that time.  She has continued to have 10 out of 10 intensity generalized abdominal pain since then.  Also continue to have some chest pain since then.  She has continued to vomit and has not been able to tolerate any p.o. intake.  Does report having a normal bowel movement in the afternoon.  She does not recall taking erythromycin since her recent hospital discharge.  States her blood glucose has been in the 200s at home.  ED Course: Afebrile.  Tachycardic and tachypneic on arrival.  Not hypoxic.  WBC count 18.6.  Lactic acid 3.2.  Hemoglobin 7.8, stable compared to labs done during recent hospitalization.  Platelet count 594, previously normal.  Potassium 2.6.  Magnesium level 0.7.  Bicarb 25, anion gap 17.  Blood glucose 119.  BUN 23, creatinine 1.5.  Creatinine was 1.1 at the time of recent hospital discharge.  Lipase and LFTs normal.  UA pending.  Blood ethanol level undetectable.  Blood culture x2 pending.  UDS pending.  SARS-CoV-2 PCR test negative.  Influenza panel negative. No imaging done. Patient received Dilaudid, 1 L LR bolus, IV  magnesium 2 g, IV potassium supplementation, and Zofran.  **Interim History Continues to have some nausea vomiting and abdominal pain so her diet was slowly advanced but she has not been very compliant with her diet.  Gastroenterology was consulted for further evaluation recommendations and she states that she ate half of her burger the day before yesterday but continues to have some nausea and vomiting and states that she is having some loose watery bowel movements..  She continues to have small amount of emesis and GI continues to recommend low fiber, low residue diet and small frequent meals.    GI had recommended some Reglan but patient wants avoid the side effects of tardive dyskinesia and tremors but today she is amenable to taking 3 doses.  GI is scheduled her on 10 mg IV every 8 for 3 doses and then stopping follow-up continuing the erythromycin to 50 mg p.o. 3 times daily with meals and bedtime along with Phenergan 25 mg IV every 6 as needed for nausea vomiting  GI also recommended a nutritionist consultation and recommends avoiding narcotic usage and we have tried to wean this back today and cut her back from 1 mg of Dilaudid every 4 hours to 0.5  milligram every 8 hours yesterday with the hopes of getting her off of it any narcotic medications and will stop it today.  Dietitian spoke with the patient but patient verbalized understanding and did not really want to talk and so we will expect full compliance with her diet education.  Appreciate GIs assistance.  We have gotten her  off of the IV pain medication however there is no real good other options so we will put her back on oxycodone low-dose 5 mg every 12 hours but this caused a reaction with her and she started having some dystonic and tardive dyskinesia movements..  She continues to have some nausea or vomiting and tolerated the Reglan but still feels about the same and continues to complain about significant amount of abdominal pain but  thinks is improving.  We will not give her any more narcotic medications as she continues to have these dystonic movements.  We will try some benzos and I have spoken to neurology who recommends giving the patient Benadryl and get an EEG.  Her renal function slightly went up so we will give her IV fluids and stop her torsemide.  Assessment & Plan:   Principal Problem:   Intractable nausea and vomiting Active Problems:   Hypokalemia   Leukocytosis   Abdominal pain   Lactic acidosis  Abdominal pain, intractable nausea and vomiting in the setting of Diabetic Gastroparesis  -Patient presenting with acute onset nausea/vomiting and abdominal pain.  Suspicion for diabetic gastroparesis.   -Patient is afebrile, but had elevated WBC count of 16.2 and a lactic acid of 3.2 on admission; now WBC is 10.1 -Lipase and LFTs within normal limits as AST is 10, ALT is 10  -CT Abd/Pelvis showed "No acute finding including bowel obstruction or visible inflammation.  Chronic findings are stable from CT 1 month ago and described above." -Previous gastric emptying study 2018 that was unrevealing -N.p.o initially but now on a CLD and will attempt Soft Diet and she is now on a soft diet but has not been compliant with a low fiber, low residue diet -NS at 75 mL's restarted now that she has her renal dysfunction -Started on Erythromycin 250 mg p.o. 3 times daily with meals and nightly and will continue -C/w Supportive care, Antiemetics with Promethazine 25 mg IV q6hprn Nausea and Vomiting to be stopped and will switch her to Zofran given her dystonic movements -C/w PPI with Pantoprazole 40 mg po BID  -Judicious use of Narcotic Medications and; Currently has Hydromorphone 1 mg IV q4hprn Severe Pain and we have reduced this to half a milligram every 8 hours as needed for severe pain and this is now been weaned off and stopped started on p.o. oxycodone which caused dystonic movements worsening in the setting with  promethazine so we will get her off the promethazine either. -Consulted gastroenterology for further evaluation and recommendations appreciate their evaluation -GI recommended some recommended above patient wants to avoid it due to the side effect of tardive dyskinesia and tremors.  The day before yesterday she is having her first solid meal since admission but she continues to have a small amount of emesis and has been nauseous and has not been compliant with her diet.  Gastroenterology recommending continuing low fiber, low residue diet and small frequent meals but patient does not want this.  She had scrambled eggs this morning but has been seen to order high carb meals; dietitian was consulted for diet education and will continue to try and control her blood sugars carefully and avoid narcotics -CBG's had been better controlled but now ranging from 188-342 -continue supportive management given that this is slow to improve and because she is noncompliant.;  Still complains of abdominal pain despite the fact that she wants to continue to have burgers and chicken -GI has stress and urge that food with low  fiber and more liquid consistency would be preferred with gastroparesis but patient does not want to try this and she does not want her erythema sent to be increased and is currently to 50 mg p.o. 3 times daily with meals and bedtime -Gastroenterology has spoke to her about Reglan and she is now amenable and so they will be giving her 10 mg IV every 8 hours for 3 doses and then stopping to see if this helps and it initially did but she had some nausea vomiting yesterday; she is off the Reglan now -She clinically has been about the same the last few days with intermittent nausea vomiting as well as abdominal pain. -Very slow to respond but improved  Atypical Chest Pain, improved -Improved initially but was having chest pain again and radiated into her shoulders -Initial EKG without acute ischemic  changes.  High-sensitivity troponin 30 and trended down to 23 -Continue to trend troponin and that she is having it again and repeat troponin this morning was 26 and after that was 22 -Repeat EKG yesterday AM showed a normal sinus rhythm at a rate of 94 with a QTC of 460 with no evidence of any ischemic changes on my interpretation -Replete Electrolytes -Continue to monitor on telemetry  Leukocytosis, improved initially but now slightly worsened -Etiology likely reactive from dehydration.   -Unlikely infectious etiology with normal procalcitonin.   -No acute findings on CT abdomen/pelvis.  -Patient's WBC is now 10.0 -Urinalysis Still pending but UDS was Negative  -Blood cultures x2: NGTD at 5 days -Continue to monitor CBC daily and continue to evaluate carefully  New dystonic movements/tardive dyskinesia/tremors of the head -She started having some stuttering and tremors of her neck and head and I spoke with neurology who believes that she is having dystonic movements in the setting of her opiate usage as well as Phenergan usage as well as Reglan usage -Dr. Leonel Ramsay recommends giving the patient some benzos as well as Benadryl which I ordered at 25 mg IV x1 -We will obtain an EEG for further evaluation -If she continues to have these movements neurology formally consult in the a.m. after we let them know if she is still having these movements.  Lactic Acidosis -Likely secondary to dehydration.   -Unlikely infectious process.   -Procalcitonin less than 0.10. -Blood ethanol level undetectable.    -CT abdomen/pelvis negative. -Lactic Acid 3.2--> 2.1 --> 1.2 -IV fluid hydration is now stopped as she is improved and lactic acid level is now normalized  Hypokalemia -Likely related to decreased p.o. intake from intractable nausea/vomiting and home diuretic use.  -Severe hypomagnesemia also likely contributing (0.7 on admission) -K+ this AM was 3.1 -Replete with p.o. potassium  chloride 40 mg twice daily x2 doses -EKG without acute changes.  -Continue to hold home Torsemide -Continue to monitor electrolytes daily  Severe Hypomagnesemia -Improved.  Patient's magnesium went from 0.7 is now 2.1 -Continue to monitor and replete as necessary -Repeat magnesium level in the a.m.  AKI on CKD stage III, worsened -BUN 23, creatinine 1.5, creatinine was 1.1 at the time of recent hospital discharge.  -Suspect prerenal from dehydration and home diuretic use.  -Continue holding home diuretic with torsemide -Restarted NS at 75 mL's per hour  -Patient's BUN/creatinine went from 20/1.11 -> 21/1.31 -> 21/1.27 -> 23/1.53  and in the setting of emesis and may need to go back on IV fluids if she continues to worsen but it is improved and is now 15/1.11 -> 15/1.28 and again  has worsened and BUN/creatinine is 20/1.94 -Avoid nephrotoxic medications, contrast dyes, hypotension and renally adjust medications -Repeat CMP in a.m.  Poorly controlled Insulin-Dependent Type 2 Diabetes  -Hemoglobin A1c A1c 8.4.   -Home regimen includes Lantus 45 units subcutaneously daily, NovoLog 15 subcutaneously 3 times daily AC. -Diabetic educator following, appreciate assistance -Restart Lantus at a lower dose, 25 units of insulin daily and increase this to 30 units subcu daily -Also added NovoLog 3 units 3 times daily with meals -C/w Insulin sliding scale for coverage with moderate NovoLog/scale AC -CBG's elevated and ranging from 188-342  Hypertension -Resumed her home hydralazine 25 mg p.o. 3 times daily and will continue metoprolol 25 mg p.o. daily  CAD -Continue with metoprolol succinate 25 mg p.o. daily, nitroglycerin 0.4 mg every 5 minutes, atorvastatin 10 mg p.o. daily, hydralazine, 25 mg p.o. 3 times daily, as well as isosorbide mononitrate  Chronic Systolic CHF -EF 40 to 79% on TTE 01/2019. -No signs of volume overload at this time. -Resumed her home torsemide but will  discontinue it again given her renal dysfunction -Continue Metoprolol Succinate 25 mg p.o. daily -Resumed her hydralazine and Imdur combination today -Continue to monitor volume status carefully and continue to monitor for signs and symptoms of volume overload -Strict I's and O's and daily weights; She is + 4.395 liters since admission but question if she has accurate I's and O's and Weight was not done and was 335 and is now 331  Paroxysmal Atrial Fibrilliation -Currently in sinus rhythm. Continue home Metoprolol XL 25 mg po Daily and Apixaban 5 mg po BID -Checking TSH  GERD -Continue PPI with Pantoprazole 40 mg po BID  Anemia of chronic disease -Patient's hemoglobin/hematocrit went from 8.6/20.5 is now 7.6/25.1 -Anemia panel done on 06/26/2019 showed an iron level of 18, U IBC of 327, TIBC of 345, saturation ratios of 5%, ferritin level 48, folate level of 6.6, and vitamin B12 level of 251 -Continue to monitor for signs and symptoms of bleeding as she is anticoagulated with Apixaban; currently no overt bleeding noted -Repeat CBC in a.m.  Hyperlipidemia  -Resumed her atorvastatin 10 mg p.o. daily  Depression -Remained stable.  Patient maintained on home regimen of antidepressant medication with Duloxetine 40 mg po BID  Super Morbid Obesity -Estimated body mass index is 53.42 kg/m as calculated from the following:   Height as of this encounter: 5\' 6"  (1.676 m).   Weight as of this encounter: 150.1 kg. -Weight Loss and Dietary Counseling given   Thrombocytosis -Patient's platelet count went from 361 is now 403 and is further worsened to 425 -Continue to monitor and trend and repeat CBC in the AM  DVT prophylaxis: Anticoagulated with Apixaban 5 mg po BID  Code Status: FULL CODE  Family Communication: No family present at bedside  Disposition Plan: Patient is from home likely to be discharged home when she is able to tolerate a diet without abdominal pain or nausea but she has  not been very compliant with her diet.  Gastroenterology has been consulted given her suspected diabetic gastroparesis.  We have also consulted PT OT to further evaluate and treat and she has no follow-up.  She is now going to be started on IV Reglan  Status is: Inpatient  Remains inpatient appropriate because:Persistent severe electrolyte disturbances, Ongoing active pain requiring inpatient pain management and Inpatient level of care appropriate due to severity of illness; continues to have abdominal pain and nausea vomiting and p.o. intake has been intermittent  Dispo: The patient is from: Home              Anticipated d/c is to: Home              Anticipated d/c date is: 1 day              Patient currently is not medically stable to d/c.  And will need GI clearance prior to discharge and she is now going to be started on IV Reglan yesterday and tolerated but continues to have some nausea vomiting now having a worsened AKI and dystonic movements   Consultants:   Gastroenterology   Discussed the case with neurology Dr. Kathrynn Speed   Procedures:  EEG   Antimicrobials:  Anti-infectives (From admission, onward)   Start     Dose/Rate Route Frequency Ordered Stop   07/10/19 0800  erythromycin (E-MYCIN) tablet 250 mg     250 mg Oral 3 times daily with meals & bedtime 07/10/19 0342       Subjective: Seen and examined at bedside and this morning she states that she still having abdominal pain and had some nausea vomiting but her main complaint is now this shakes and tremors that she is having any upper body especially around the head and neck.  She started having some twitching as well and feels it is related to her opiates.  States that this happened before in the past and that benzos have helped.  Denies any chest pain but is in somewhat mild distress from his tremors and shakes and tardive dyskinesia movements.  Objective: Vitals:   07/17/19 0538 07/17/19 1057 07/17/19 1300  07/17/19 1514  BP: (!) 116/54 (!) 146/84 115/63 110/76  Pulse: 82 (!) 102 89 86  Resp: 18 (!) 31 20 (!) 23  Temp: (!) 97.5 F (36.4 C) 98.1 F (36.7 C) 98.4 F (36.9 C) 98.1 F (36.7 C)  TempSrc: Oral Oral Oral Oral  SpO2: 99% 98% 97% 97%  Weight:      Height:        Intake/Output Summary (Last 24 hours) at 07/17/2019 1651 Last data filed at 07/17/2019 1400 Gross per 24 hour  Intake 360 ml  Output --  Net 360 ml   Filed Weights   07/13/19 0625 07/14/19 0532 07/15/19 0705  Weight: (!) 152.8 kg (!) 150.8 kg (!) 150.1 kg   Examination: Physical Exam:  Constitutional: WN/WD African-American female sitting up in the chair with significant head tremors and movements and appears to be uncomfortable and jerking. Eyes: Lids and conjunctivae normal, sclerae anicteric eyeballs are protuberant ENMT: External Ears, Nose appear normal. Grossly normal hearing.  Neck: Appears normal, supple, no cervical masses, normal ROM, no appreciable thyromegaly: No JVD Respiratory: Diminished to auscultation bilaterally, no wheezing, rales, rhonchi or crackles. Normal respiratory effort and patient is not tachypenic. No accessory muscle use.  Unlabored breathing Cardiovascular: RRR, no murmurs / rubs / gallops. S1 and S2 auscultated.  Minimal extremity edema. Abdomen: Soft, tender to palpate.  Distended secondary body habitus.  Bowel sounds are positive GU: Deferred. Musculoskeletal: No clubbing / cyanosis of digits/nails. No joint deformity upper and lower extremities..  Skin: No rashes, lesions, ulcers on physical evaluation. No induration; Warm and dry.  Neurologic: CN 2-12 grossly intact but she is having a significant head jerking and dystonic movements and stuttering. Romberg sign and cerebellar reflexes not assessed.  Psychiatric: Normal judgment and insight. Alert and oriented x 3. Anxious mood and appropriate affect.   Data Reviewed:  I have personally reviewed following labs and imaging  studies  CBC: Recent Labs  Lab 07/13/19 0436 07/14/19 0547 07/15/19 0600 07/16/19 0402 07/17/19 0505  WBC 10.1 8.9 10.1 10.8* 10.0  NEUTROABS 6.6 5.8 7.2 7.1 6.5  HGB 7.0* 7.2* 7.5* 8.0* 7.6*  HCT 23.5* 24.2* 25.0* 26.1* 25.1*  MCV 90.0 90.0 89.0 88.2 87.8  PLT 361 403* 443* 466* 932*   Basic Metabolic Panel: Recent Labs  Lab 07/13/19 0436 07/14/19 0547 07/15/19 0600 07/16/19 0402 07/17/19 0505  NA 136 139 140 138 136  K 3.8 3.7 3.6 3.2* 3.1*  CL 102 103 105 99 98  CO2 26 27 26 28 26   GLUCOSE 194* 212* 220* 182* 273*  BUN 23* 19 15 15 20   CREATININE 1.53* 1.17* 1.11* 1.28* 1.94*  CALCIUM 8.6* 8.8* 9.1 9.0 8.4*  MG 2.0 1.7 1.5* 1.4* 2.1  PHOS 3.2 2.6 2.9 3.2 4.2   GFR: Estimated Creatinine Clearance: 49.4 mL/min (A) (by C-G formula based on SCr of 1.94 mg/dL (H)). Liver Function Tests: Recent Labs  Lab 07/13/19 0436 07/14/19 0547 07/15/19 0600 07/16/19 0402 07/17/19 0505  AST 8* 10* 10* 9* 12*  ALT 9 10 10 9 11   ALKPHOS 75 78 77 81 94  BILITOT 0.6 0.6 0.5 0.4 0.4  PROT 6.2* 6.3* 6.4* 6.7 6.5  ALBUMIN 2.6* 2.7* 2.8* 2.9* 2.9*   No results for input(s): LIPASE, AMYLASE in the last 168 hours. No results for input(s): AMMONIA in the last 168 hours. Coagulation Profile: No results for input(s): INR, PROTIME in the last 168 hours. Cardiac Enzymes: No results for input(s): CKTOTAL, CKMB, CKMBINDEX, TROPONINI in the last 168 hours. BNP (last 3 results) No results for input(s): PROBNP in the last 8760 hours. HbA1C: No results for input(s): HGBA1C in the last 72 hours. CBG: Recent Labs  Lab 07/16/19 1643 07/16/19 2057 07/17/19 0732 07/17/19 1053 07/17/19 1632  GLUCAP 341* 342* 203* 188* 223*   Lipid Profile: No results for input(s): CHOL, HDL, LDLCALC, TRIG, CHOLHDL, LDLDIRECT in the last 72 hours. Thyroid Function Tests: No results for input(s): TSH, T4TOTAL, FREET4, T3FREE, THYROIDAB in the last 72 hours. Anemia Panel: No results for input(s):  VITAMINB12, FOLATE, FERRITIN, TIBC, IRON, RETICCTPCT in the last 72 hours. Sepsis Labs: Recent Labs  Lab 07/11/19 0459  LATICACIDVEN 1.2    Recent Results (from the past 240 hour(s))  Respiratory Panel by RT PCR (Flu A&B, Covid) - Nasopharyngeal Swab     Status: None   Collection Time: 07/10/19  1:19 AM   Specimen: Nasopharyngeal Swab  Result Value Ref Range Status   SARS Coronavirus 2 by RT PCR NEGATIVE NEGATIVE Final    Comment: (NOTE) SARS-CoV-2 target nucleic acids are NOT DETECTED. The SARS-CoV-2 RNA is generally detectable in upper respiratoy specimens during the acute phase of infection. The lowest concentration of SARS-CoV-2 viral copies this assay can detect is 131 copies/mL. A negative result does not preclude SARS-Cov-2 infection and should not be used as the sole basis for treatment or other patient management decisions. A negative result may occur with  improper specimen collection/handling, submission of specimen other than nasopharyngeal swab, presence of viral mutation(s) within the areas targeted by this assay, and inadequate number of viral copies (<131 copies/mL). A negative result must be combined with clinical observations, patient history, and epidemiological information. The expected result is Negative. Fact Sheet for Patients:  PinkCheek.be Fact Sheet for Healthcare Providers:  GravelBags.it This test is not yet ap proved or cleared by  the [employer] and  has been authorized for detection and/or diagnosis of SARS-CoV-2 by FDA under an Emergency Use Authorization (EUA). This EUA will remain  in effect (meaning this test can be used) for the duration of the COVID-19 declaration under Section 564(b)(1) of the Act, 21 U.S.C. section 360bbb-3(b)(1), unless the authorization is terminated or revoked sooner.    Influenza A by PCR NEGATIVE NEGATIVE Final   Influenza B by PCR NEGATIVE NEGATIVE  Final    Comment: (NOTE) The Xpert Xpress SARS-CoV-2/FLU/RSV assay is intended as an aid in  the diagnosis of influenza from Nasopharyngeal swab specimens and  should not be used as a sole basis for treatment. Nasal washings and  aspirates are unacceptable for Xpert Xpress SARS-CoV-2/FLU/RSV  testing. Fact Sheet for Patients: PinkCheek.be Fact Sheet for Healthcare Providers: GravelBags.it This test is not yet approved or cleared by the [place] FDA and  has been authorized for detection and/or diagnosis of SARS-CoV-2 by  FDA under an Emergency Use Authorization (EUA). This EUA will remain  in effect (meaning this test can be used) for the duration of the  Covid-19 declaration under Section 564(b)(1) of the Act, 21  U.S.C. section 360bbb-3(b)(1), unless the authorization is  terminated or revoked. Performed at [facility], [place] [address], [place], [place] [address]   Culture, blood (routine x 2)     Status: None   Collection Time: 07/10/19  1:19 AM   Specimen: BLOOD  Result Value Ref Range Status   Specimen Description   Final    BLOOD BLOOD RIGHT FOREARM Performed at [facility] [address] [place], [place] [address]    Special Requests   Final    BOTTLES DRAWN AEROBIC AND ANAEROBIC Blood Culture adequate volume Performed at [facility] [address], [place], [place] [address]    Culture   Final    NO GROWTH 5 DAYS Performed at [facility], [place] [address], [place], [place] [address]    Report Status 07/15/2019 FINAL  Final  Culture, blood (routine x 2)     Status: None   Collection Time: 07/10/19  4:54 AM   Specimen: BLOOD  Result Value Ref Range Status   Specimen Description   Final    BLOOD BLOOD LEFT FOREARM Performed at [facility] [address], [place], [place] [address]    Special Requests   Final     BOTTLES DRAWN AEROBIC AND ANAEROBIC Blood Culture adequate volume Performed at [facility] [address], [place], [place] [address]    Culture   Final    NO GROWTH 5 DAYS Performed at [facility], [place] [address], [place], [place] [address]    Report Status 07/15/2019 FINAL  Final    RN Pressure Injury Documentation:     Estimated body mass index is 53.42 kg/m as calculated from the following:   Height as of this encounter: 5\' 6"  (1.676 m).   Weight as of this encounter: 150.1 kg.  Malnutrition Type:      Malnutrition Characteristics:      Nutrition Interventions:    Radiology Studies: EEG adult  Result Date: August 09, 2019 Lora Havens, MD     09-Aug-2019  2:13 PM Patient Name: Deborah [name] MRN: [id number] Epilepsy Attending: Lora Havens Referring Physician/Provider: Dr. Anson Fret Date: 09-Aug-2019 Duration: 25.26 minutes Patient history: 54 year old female with tremor-like movements.  EEG to evaluate for  seizures. Level of alertness: Awake, asleep AEDs during EEG study: Xanax Technical aspects: This EEG study was done with scalp electrodes positioned according to the 10-20 International system of electrode placement. Electrical activity was acquired at a sampling rate of 500Hz  and reviewed with a high frequency filter of 70Hz  and a low frequency filter of 1Hz . EEG data were recorded continuously and digitally stored. Description: The posterior dominant rhythm consists of 8-9 Hz activity of moderate voltage (25-35 uV) seen predominantly in posterior head regions, symmetric and reactive to eye opening and eye closing.  Sleep was characterized by vertex waves, sleep spindles (12 to 14 Hz), maximal frontocentral region.  Physiologic photic driving was seen during photic stimulation.  Hyperventilation was not performed.        IMPRESSION: This study is within normal limits. No seizures or epileptiform discharges were seen throughout the  recording. Priyanka [name]   Scheduled Meds: . apixaban  5 mg Oral BID  . atorvastatin  10 mg Oral Daily  . DULoxetine  40 mg Oral BID  . erythromycin  250 mg Oral TID WC & HS  . hydrALAZINE  25 mg Oral TID  . insulin aspart  0-15 Units Subcutaneous TID WC  . [START ON 07/18/2019] insulin aspart  3 Units Subcutaneous TID WC  . insulin glargine  30 Units Subcutaneous QHS  . isosorbide mononitrate  60 mg Oral Daily  . loratadine  10 mg Oral Daily  . magnesium oxide  400 mg Oral BID  . metoprolol succinate  25 mg Oral Daily  . pantoprazole  40 mg Oral BID  . potassium chloride  40 mEq Oral BID   Continuous Infusions: . sodium chloride 75 mL/hr at 07/17/19 1402    LOS: 7 days   Kerney Elbe, DO [facility] PAGER is on [place]  If 7PM-7AM, please contact night-coverage [email or web address]

## 2019-07-17 NOTE — Progress Notes (Signed)
   07/17/19 1057  Assess: MEWS Score  Temp 98.1 F (36.7 C)  BP (!) 146/84  Pulse Rate (!) 102  Resp (!) 31  Level of Consciousness Alert  SpO2 98 %  O2 Device Room Air  Assess: MEWS Score  MEWS Temp 0  MEWS Systolic 0  MEWS Pulse 1  MEWS RR 2  MEWS LOC 0  MEWS Score 3  MEWS Score Color Yellow  Assess: if the MEWS score is Yellow or Red  Were vital signs taken at a resting state? Yes  Focused Assessment Documented focused assessment  Early Detection of Sepsis Score *See Row Information* Medium  MEWS guidelines implemented *See Row Information* Yes  Treat  MEWS Interventions Escalated (See documentation below)  Take Vital Signs  Increase Vital Sign Frequency  Yellow: Q 2hr X 2 then Q 4hr X 2, if remains yellow, continue Q 4hrs  Escalate  MEWS: Escalate Yellow: discuss with charge nurse/RN and consider discussing with provider and RRT  Notify: Charge Nurse/RN  Name of Charge Nurse/RN Notified Megan Bullins  Date Charge Nurse/RN Notified 07/17/19  Time Charge Nurse/RN Notified 1100

## 2019-07-17 NOTE — Procedures (Signed)
Patient Name: Deborah [name]  MRN: [id number]  Epilepsy Attending: Lora Havens  Referring Physician/Provider: Dr. Anson Fret Date: 07/17/2019 Duration: 25.26 minutes  Patient history: 54 year old female with tremor-like movements.  EEG to evaluate for seizures.  Level of alertness: Awake, asleep  AEDs during EEG study: Xanax  Technical aspects: This EEG study was done with scalp electrodes positioned according to the 10-20 International system of electrode placement. Electrical activity was acquired at a sampling rate of 500Hz  and reviewed with a high frequency filter of 70Hz  and a low frequency filter of 1Hz . EEG data were recorded continuously and digitally stored.   Description: The posterior dominant rhythm consists of 8-9 Hz activity of moderate voltage (25-35 uV) seen predominantly in posterior head regions, symmetric and reactive to eye opening and eye closing.  Sleep was characterized by vertex waves, sleep spindles (12 to 14 Hz), maximal frontocentral region.  Physiologic photic driving was seen during photic stimulation.  Hyperventilation was not performed.          IMPRESSION: This study is within normal limits. No seizures or epileptiform discharges were seen throughout the recording.  Deborah [name] [name]

## 2019-07-18 DIAGNOSIS — N179 Acute kidney failure, unspecified: Secondary | ICD-10-CM

## 2019-07-18 DIAGNOSIS — E1143 Type 2 diabetes mellitus with diabetic autonomic (poly)neuropathy: Principal | ICD-10-CM

## 2019-07-18 DIAGNOSIS — E876 Hypokalemia: Secondary | ICD-10-CM

## 2019-07-18 DIAGNOSIS — K3184 Gastroparesis: Secondary | ICD-10-CM

## 2019-07-18 LAB — COMPREHENSIVE METABOLIC PANEL
ALT: 9 U/L (ref 0–44)
AST: 10 U/L — ABNORMAL LOW (ref 15–41)
Albumin: 2.7 g/dL — ABNORMAL LOW (ref 3.5–5.0)
Alkaline Phosphatase: 89 U/L (ref 38–126)
Anion gap: 9 (ref 5–15)
BUN: 24 mg/dL — ABNORMAL HIGH (ref 6–20)
CO2: 26 mmol/L (ref 22–32)
Calcium: 8.1 mg/dL — ABNORMAL LOW (ref 8.9–10.3)
Chloride: 101 mmol/L (ref 98–111)
Creatinine, Ser: 1.45 mg/dL — ABNORMAL HIGH (ref 0.44–1.00)
GFR calc Af Amer: 47 mL/min — ABNORMAL LOW (ref >60)
GFR calc non Af Amer: 40 mL/min — ABNORMAL LOW (ref >60)
Glucose, Bld: 210 mg/dL — ABNORMAL HIGH (ref 70–99)
Potassium: 3.4 mmol/L — ABNORMAL LOW (ref 3.5–5.1)
Sodium: 136 mmol/L (ref 135–145)
Total Bilirubin: 0.6 mg/dL (ref 0.3–1.2)
Total Protein: 6.1 g/dL — ABNORMAL LOW (ref 6.5–8.1)

## 2019-07-18 LAB — CBC WITH DIFFERENTIAL/PLATELET
Abs Immature Granulocytes: 0.07 10*3/uL (ref 0.00–0.07)
Basophils Absolute: 0.1 10*3/uL (ref 0.0–0.1)
Basophils Relative: 1 %
Eosinophils Absolute: 0.4 10*3/uL (ref 0.0–0.5)
Eosinophils Relative: 4 %
HCT: 24.3 % — ABNORMAL LOW (ref 36.0–46.0)
Hemoglobin: 7.2 g/dL — ABNORMAL LOW (ref 12.0–15.0)
Immature Granulocytes: 1 %
Lymphocytes Relative: 29 %
Lymphs Abs: 3 10*3/uL (ref 0.7–4.0)
MCH: 26.3 pg (ref 26.0–34.0)
MCHC: 29.6 g/dL — ABNORMAL LOW (ref 30.0–36.0)
MCV: 88.7 fL (ref 80.0–100.0)
Monocytes Absolute: 0.7 10*3/uL (ref 0.1–1.0)
Monocytes Relative: 7 %
Neutro Abs: 6.3 10*3/uL (ref 1.7–7.7)
Neutrophils Relative %: 58 %
Platelets: 432 10*3/uL — ABNORMAL HIGH (ref 150–400)
RBC: 2.74 MIL/uL — ABNORMAL LOW (ref 3.87–5.11)
RDW: 14.9 % (ref 11.5–15.5)
WBC: 10.5 10*3/uL (ref 4.0–10.5)
nRBC: 0 % (ref 0.0–0.2)

## 2019-07-18 LAB — PHOSPHORUS: Phosphorus: 3.3 mg/dL (ref 2.5–4.6)

## 2019-07-18 LAB — MAGNESIUM: Magnesium: 1.9 mg/dL (ref 1.7–2.4)

## 2019-07-18 LAB — GLUCOSE, CAPILLARY
Glucose-Capillary: 213 mg/dL — ABNORMAL HIGH (ref 70–99)
Glucose-Capillary: 191 mg/dL — ABNORMAL HIGH (ref 70–99)
Glucose-Capillary: 245 mg/dL — ABNORMAL HIGH (ref 70–99)
Glucose-Capillary: 331 mg/dL — ABNORMAL HIGH (ref 70–99)

## 2019-07-18 MED ORDER — POTASSIUM CHLORIDE 20 MEQ/15ML (10%) PO SOLN
40.0000 meq | Freq: Once | ORAL | Status: DC
  Filled 2019-07-18: qty 30

## 2019-07-18 MED ORDER — POTASSIUM CHLORIDE CRYS ER 20 MEQ PO TBCR
40.0000 meq | EXTENDED_RELEASE_TABLET | Freq: Once | ORAL | Status: AC
  Administered 2019-07-18: 40 meq via ORAL
  Filled 2019-07-18: qty 2

## 2019-07-18 NOTE — Progress Notes (Signed)
PROGRESS NOTE  Deborah [name] WER:[id number] DOB: [birth year] DOA: 07/09/2019 PCP: [name], DO   LOS: 8 days   Brief Narrative / Interim history: 54 year old female with chronic systolic CHF, paroxysmal A. fib on Eliquis, DM 2, recurrent admissions for DKA, CKD; hypertension, hyperlipidemia, prior CVA, diabetic gastroparesis intolerant to Reglan, anemia of chronic disease, morbid obesity came to the hospital with intractable nausea and vomiting.  Her hospitalization was similar to her prior stays with severe gastroparesis.  GI consulted and are following patient.  Her medications have been adjusted several times in the hospital, and eventually has been placed on Achromycin which seems to be helping.  Subjective / 24h Interval events: She is feeling a little bit better, has not had breakfast no lunch today, had small episode of vomiting last night but nothing since.  No shortness of breath  Assessment & Plan: Principal Problem Abdominal pain, intractable nausea vomiting in the setting of diabetic gastroparesis -appreciate gastroenterology follow-up, she seems to be intolerant to Reglan but seems to be responding to erythromycin.  Continue.  Awaiting for the patient to eat and if she tolerates diet this afternoon and evening meal to go home tomorrow.  Continue supportive treatment with antiemetics.  Judicious use of narcotic medications, she did received oxycodone but that causes patient to have dystonic movements. -Per prior notes there is a degree of noncompliance with dietary recommendations per GI and the previous hospitalist.  Active Problems Atypical chest pain-initial EKG without acute ischemic changes, high-sensitivity troponin overall flat not in a pattern consistent with ACS.  Leukocytosis-resolved  Dystonic movements of the head-previous hospitalist discussed with neurology, possibly related to opiate use/Phenergan/Reglan.  Discontinue all those.  Use Zofran as needed,  avoid narcotics.  As needed Benadryl and Xanax  Lactic acidosis-resolved  Hypokalemia/hypomagnesemia -continue to replete  Acute kidney injury on chronic kidney disease stage III-her baseline creatinine is around 1.1, currently has been stable around 1.4.  Continue to hold home diuretics.  Poorly controlled DM2-hemoglobin A1c 8.4, continue Lantus, sliding scale.  Essential hypertension-continue Imdur, metoprolol, hydralazine  Coronary artery disease-continue beta-blockers, statin  Chronic systolic CHF-EF 19-50% in November 2020.  Euvolemic right now  Paroxysmal A. fib-continue home beta-blocker, Eliquis  Morbid obesity-patient will benefit from weight loss  Depression-continue home medications  Anemia of chronic disease-no bleeding, hemoglobin overall stable  CBG (last 3)  Recent Labs    07/17/19 2203 07/18/19 0728 07/18/19 1142  GLUCAP 317* 213* 191*   Scheduled Meds: . apixaban  5 mg Oral BID  . atorvastatin  10 mg Oral Daily  . DULoxetine  40 mg Oral BID  . erythromycin  250 mg Oral TID WC & HS  . hydrALAZINE  25 mg Oral TID  . insulin aspart  0-15 Units Subcutaneous TID WC  . insulin aspart  3 Units Subcutaneous TID WC  . insulin glargine  30 Units Subcutaneous QHS  . isosorbide mononitrate  60 mg Oral Daily  . loratadine  10 mg Oral Daily  . magnesium oxide  400 mg Oral BID  . metoprolol succinate  25 mg Oral Daily  . pantoprazole  40 mg Oral BID   Continuous Infusions: PRN Meds:.albuterol, ALPRAZolam, fluticasone, nitroGLYCERIN, ondansetron (ZOFRAN) IV, oxyCODONE, sodium chloride flush  DVT prophylaxis: Eliquis Code Status: Full code Family Communication: no family at bedside   Status is: Inpatient  Remains inpatient appropriate because:  advance diet, last emesis last night  Dispo: The patient is from: Home  Anticipated d/c is to: Home              Anticipated d/c date is: 1 day              Patient currently is not medically stable to  d/c.  Consultants:  GI  Procedures:  None   Microbiology  None   Antimicrobials: None     Objective: Vitals:   07/17/19 1754 07/17/19 2345 07/18/19 0539 07/18/19 0939  BP: 122/73 134/81 124/64 127/62  Pulse: 94 (!) 101 99 90  Resp: 18 19 17 17   Temp: 97.8 F (36.6 C) 98.4 F (36.9 C) (!) 97.3 F (36.3 C)   TempSrc: Oral  Oral   SpO2: 98% 97% 97% 99%  Weight:      Height:        Intake/Output Summary (Last 24 hours) at 07/18/2019 1326 Last data filed at 07/17/2019 1800 Gross per 24 hour  Intake 896.67 ml  Output --  Net 896.67 ml   Filed Weights   07/13/19 0625 07/14/19 0532 07/15/19 0705  Weight: (!) 152.8 kg (!) 150.8 kg (!) 150.1 kg    Examination:  Constitutional: NAD Eyes: no scleral icterus ENMT: Mucous membranes are moist.  Neck: normal, supple Respiratory: clear to auscultation bilaterally, no wheezing, no crackles.  Cardiovascular: Regular rate and rhythm, no murmurs / rubs / gallops.  Abdomen: mild tenderness to palpation, no guarding / rebound. BS+ Musculoskeletal: no clubbing / cyanosis.  Skin: no rashes Neurologic: grossly no focal deficits   Data Reviewed: I have independently reviewed following labs and imaging studies   CBC: Recent Labs  Lab 07/14/19 0547 07/15/19 0600 07/16/19 0402 07/17/19 0505 07/18/19 0419  WBC 8.9 10.1 10.8* 10.0 10.5  NEUTROABS 5.8 7.2 7.1 6.5 6.3  HGB 7.2* 7.5* 8.0* 7.6* 7.2*  HCT 24.2* 25.0* 26.1* 25.1* 24.3*  MCV 90.0 89.0 88.2 87.8 88.7  PLT 403* 443* 466* 425* 798*   Basic Metabolic Panel: Recent Labs  Lab 07/14/19 0547 07/15/19 0600 07/16/19 0402 07/17/19 0505 07/18/19 0419  NA 139 140 138 136 136  K 3.7 3.6 3.2* 3.1* 3.4*  CL 103 105 99 98 101  CO2 27 26 28 26 26   GLUCOSE 212* 220* 182* 273* 210*  BUN 19 15 15 20  24*  CREATININE 1.17* 1.11* 1.28* 1.94* 1.45*  CALCIUM 8.8* 9.1 9.0 8.4* 8.1*  MG 1.7 1.5* 1.4* 2.1 1.9  PHOS 2.6 2.9 3.2 4.2 3.3   Liver Function Tests: Recent Labs  Lab  07/14/19 0547 07/15/19 0600 07/16/19 0402 07/17/19 0505 07/18/19 0419  AST 10* 10* 9* 12* 10*  ALT 10 10 9 11 9   ALKPHOS 78 77 81 94 89  BILITOT 0.6 0.5 0.4 0.4 0.6  PROT 6.3* 6.4* 6.7 6.5 6.1*  ALBUMIN 2.7* 2.8* 2.9* 2.9* 2.7*   Coagulation Profile: No results for input(s): INR, PROTIME in the last 168 hours. HbA1C: No results for input(s): HGBA1C in the last 72 hours. CBG: Recent Labs  Lab 07/17/19 1053 07/17/19 1632 07/17/19 2203 07/18/19 0728 07/18/19 1142  GLUCAP 188* 223* 317* 213* 191*    Recent Results (from the past 240 hour(s))  Respiratory Panel by RT PCR (Flu A&B, Covid) - Nasopharyngeal Swab     Status: None   Collection Time: 07/10/19  1:19 AM   Specimen: Nasopharyngeal Swab  Result Value Ref Range Status   SARS Coronavirus 2 by RT PCR NEGATIVE NEGATIVE Final    Comment: (NOTE) SARS-CoV-2 target nucleic acids are NOT DETECTED.  The SARS-CoV-2 RNA is generally detectable in upper respiratoy specimens during the acute phase of infection. The lowest concentration of SARS-CoV-2 viral copies this assay can detect is 131 copies/mL. A negative result does not preclude SARS-Cov-2 infection and should not be used as the sole basis for treatment or other patient management decisions. A negative result may occur with  improper specimen collection/handling, submission of specimen other than nasopharyngeal swab, presence of viral mutation(s) within the areas targeted by this assay, and inadequate number of viral copies (<131 copies/mL). A negative result must be combined with clinical observations, patient history, and epidemiological information. The expected result is Negative. Fact Sheet for Patients:  PinkCheek.be Fact Sheet for Healthcare Providers:  GravelBags.it This test is not yet ap proved or cleared by the [place] FDA and  has been authorized for detection and/or diagnosis of SARS-CoV-2  by FDA under an Emergency Use Authorization (EUA). This EUA will remain  in effect (meaning this test can be used) for the duration of the COVID-19 declaration under Section 564(b)(1) of the Act, 21 U.S.C. section 360bbb-3(b)(1), unless the authorization is terminated or revoked sooner.    Influenza A by PCR NEGATIVE NEGATIVE Final   Influenza B by PCR NEGATIVE NEGATIVE Final    Comment: (NOTE) The Xpert Xpress SARS-CoV-2/FLU/RSV assay is intended as an aid in  the diagnosis of influenza from Nasopharyngeal swab specimens and  should not be used as a sole basis for treatment. Nasal washings and  aspirates are unacceptable for Xpert Xpress SARS-CoV-2/FLU/RSV  testing. Fact Sheet for Patients: PinkCheek.be Fact Sheet for Healthcare Providers: GravelBags.it This test is not yet approved or cleared by the [place] FDA and  has been authorized for detection and/or diagnosis of SARS-CoV-2 by  FDA under an Emergency Use Authorization (EUA). This EUA will remain  in effect (meaning this test can be used) for the duration of the  Covid-19 declaration under Section 564(b)(1) of the Act, 21  U.S.C. section 360bbb-3(b)(1), unless the authorization is  terminated or revoked. Performed at [facility], [place] [address], [place], [place] [address]   Culture, blood (routine x 2)     Status: None   Collection Time: 07/10/19  1:19 AM   Specimen: BLOOD  Result Value Ref Range Status   Specimen Description   Final    BLOOD BLOOD RIGHT FOREARM Performed at [place] [address] [place], [place] [address]    Special Requests   Final    BOTTLES DRAWN AEROBIC AND ANAEROBIC Blood Culture adequate volume Performed at [place] [address], [place], [place] [address]    Culture   Final    NO GROWTH 5 DAYS Performed at [facility], [place] [address] [place],  [place] [address]    Report Status 07/15/2019 FINAL  Final  Culture, blood (routine x 2)     Status: None   Collection Time: 07/10/19  4:54 AM   Specimen: BLOOD  Result Value Ref Range Status   Specimen Description   Final    BLOOD BLOOD LEFT FOREARM Performed at [place] [address] [place], [place] [address]    Special Requests   Final    BOTTLES DRAWN AEROBIC AND ANAEROBIC Blood Culture adequate volume Performed at [place] [address], [place], [place] [address]    Culture   Final    NO GROWTH 5 DAYS Performed at [facility], [place]  [address] [place], [place] [address]    Report Status 07/15/2019 FINAL  Final     Radiology Studies: EEG adult  Result Date: Jul 23, 2019 Lora Havens, MD     Jul 23, 2019  2:13 PM Patient Name: DEVONY MCGRADY MRN: [id number] Epilepsy Attending: Lora Havens Referring Physician/Provider: Dr. Anson Fret Date: 23-Jul-2019 Duration: 25.26 minutes Patient history: 54 year old female with tremor-like movements.  EEG to evaluate for seizures. Level of alertness: Awake, asleep AEDs during EEG study: Xanax Technical aspects: This EEG study was done with scalp electrodes positioned according to the 10-20 International system of electrode placement. Electrical activity was acquired at a sampling rate of 500Hz  and reviewed with a high frequency filter of 70Hz  and a low frequency filter of 1Hz . EEG data were recorded continuously and digitally stored. Description: The posterior dominant rhythm consists of 8-9 Hz activity of moderate voltage (25-35 uV) seen predominantly in posterior head regions, symmetric and reactive to eye opening and eye closing.  Sleep was characterized by vertex waves, sleep spindles (12 to 14 Hz), maximal frontocentral region.  Physiologic photic driving was seen during photic stimulation.  Hyperventilation was not performed.        IMPRESSION: This study is within normal limits. No  seizures or epileptiform discharges were seen throughout the recording. Priyanka Mont Dutton, MD, PhD [facility]  Between 7 am - 7 pm I am available, please contact me via Amion or Securechat  Between 7 pm - 7 am I am not available, please contact night coverage MD/APP via Amion

## 2019-07-18 NOTE — Progress Notes (Signed)
[facility] Gastroenterology Progress Note  Deborah [name] 54 y.o. 03-28-1965  CC: Nausea/vomiting   Subjective: Patient reports feeling better this morning.  She thinks erythromycin is helping.  She had one small episode of emesis yesterday but tolerated dinner chicken last night and has not had any emesis this morning.  She endorses continued diffuse abdominal pain.  She has looser bowel movements due to erythromycin.  She states her tremors have improved and she believes it was from the oxycodone, she has had tremors in the past after taking oxycodone.  She denies any hematemesis, melena, hematochezia.  ROS : Review of Systems  Respiratory: Negative for cough and shortness of breath.   Gastrointestinal: Positive for abdominal pain, diarrhea, nausea and vomiting. Negative for blood in stool, constipation, heartburn and melena.   Objective: Vital signs in last 24 hours: Vitals:   07/18/19 0539 07/18/19 0939  BP: 124/64 127/62  Pulse: 99 90  Resp: 17 17  Temp: (!) 97.3 F (36.3 C)   SpO2: 97% 99%    Physical Exam:  General:  Lethargic, lying in bed in no distress, appears stated age, obese  Head:  Normocephalic, without obvious abnormality, atraumatic  Eyes:  Conjunctival pallor, EOMs intact  Lungs:   Clear to auscultation bilaterally, respirations unlabored  Heart:  Regular rate and rhythm, S1, S2 normal  Abdomen:   Obese, soft, non-tender, nondistended, bowel sounds active all four quadrants,  No peritoneal signs  Extremities: Extremities normal, atraumatic, no  edema  Pulses: 2+ and symmetric    Lab Results: Recent Labs    07/17/19 0505 07/18/19 0419  NA 136 136  K 3.1* 3.4*  CL 98 101  CO2 26 26  GLUCOSE 273* 210*  BUN 20 24*  CREATININE 1.94* 1.45*  CALCIUM 8.4* 8.1*  MG 2.1 1.9  PHOS 4.2 3.3   Recent Labs    07/17/19 0505 07/18/19 0419  AST 12* 10*  ALT 11 9  ALKPHOS 94 89  BILITOT 0.4 0.6  PROT 6.5 6.1*  ALBUMIN 2.9* 2.7*   Recent Labs   07/17/19 0505 07/18/19 0419  WBC 10.0 10.5  NEUTROABS 6.5 6.3  HGB 7.6* 7.2*  HCT 25.1* 24.3*  MCV 87.8 88.7  PLT 425* 432*   No results for input(s): LABPROT, INR in the last 72 hours.  Assessment: Diabetic gastroparesis, nausea/vomiting improved after Reglan x 2 doses and erythromycin.  Blood glucose remains elevated (210-317 today)  Tremors: possible related to oxycodone, resolving.  Decreased renal function: BUN 24/ Cr 1.45, as compared to BUN 20/Cr 1.94 yesterday  Mild hypokalemia, K 3.4 today  Normocytic anemia, stable, Hgb 7.2 today.  No signs of GI bleeding  Mild thrombocytosis, 432K today  Plan: Continue erythromycin250 mg 3 times daily with meals and bedtime.  Patient to be discharged on this dose.  Our office will contact her to arrange follow-up, and patient advised to contact [facility] regarding appointment and any concerns.  Continue Phenergan 25 mg IV every 6 hours as needed for nausea and vomiting.  Continue low residue diet with small portions.  Blood glucose control.  Avoid narcotic use.  [facility] will sign off. Please contact us if we can be of any further assistance during this hospital stay.  Salley Slaughter PA-C 07/18/2019, 10:11 AM  Contact #  [phone]

## 2019-07-19 ENCOUNTER — Other Ambulatory Visit: Payer: Self-pay | Admitting: Family Medicine

## 2019-07-19 DIAGNOSIS — R109 Unspecified abdominal pain: Secondary | ICD-10-CM

## 2019-07-19 LAB — GLUCOSE, CAPILLARY
Glucose-Capillary: 196 mg/dL — ABNORMAL HIGH (ref 70–99)
Glucose-Capillary: 268 mg/dL — ABNORMAL HIGH (ref 70–99)

## 2019-07-19 LAB — BASIC METABOLIC PANEL
Anion gap: 8 (ref 5–15)
BUN: 24 mg/dL — ABNORMAL HIGH (ref 6–20)
CO2: 27 mmol/L (ref 22–32)
Calcium: 8.6 mg/dL — ABNORMAL LOW (ref 8.9–10.3)
Chloride: 103 mmol/L (ref 98–111)
Creatinine, Ser: 1.26 mg/dL — ABNORMAL HIGH (ref 0.44–1.00)
GFR calc Af Amer: 56 mL/min — ABNORMAL LOW (ref >60)
GFR calc non Af Amer: 48 mL/min — ABNORMAL LOW (ref >60)
Glucose, Bld: 166 mg/dL — ABNORMAL HIGH (ref 70–99)
Potassium: 3.7 mmol/L (ref 3.5–5.1)
Sodium: 138 mmol/L (ref 135–145)

## 2019-07-19 LAB — CBC
HCT: 24.4 % — ABNORMAL LOW (ref 36.0–46.0)
Hemoglobin: 7.3 g/dL — ABNORMAL LOW (ref 12.0–15.0)
MCH: 26.7 pg (ref 26.0–34.0)
MCHC: 29.9 g/dL — ABNORMAL LOW (ref 30.0–36.0)
MCV: 89.4 fL (ref 80.0–100.0)
Platelets: 418 10*3/uL — ABNORMAL HIGH (ref 150–400)
RBC: 2.73 MIL/uL — ABNORMAL LOW (ref 3.87–5.11)
RDW: 14.6 % (ref 11.5–15.5)
WBC: 9.3 10*3/uL (ref 4.0–10.5)
nRBC: 0 % (ref 0.0–0.2)

## 2019-07-19 MED ORDER — ERYTHROMYCIN BASE 250 MG PO TABS: 250.0000 mg | ORAL_TABLET | Freq: Three times a day (TID) | ORAL | 0 refills | Status: DC

## 2019-07-19 MED ORDER — ALPRAZOLAM 1 MG PO TABS: 1.0000 mg | ORAL_TABLET | Freq: Three times a day (TID) | ORAL | 0 refills | Status: AC | PRN

## 2019-07-19 NOTE — Progress Notes (Signed)
Pt is to be discharged to home this afternoon. [name] in the room and RN reviewed discharge instructions including all Medications and schedules for these Medications. Pt sitting in chair with eyes closed and demonstrates lack of interest with RN Medication teaching and other discharge instructions. RN attempted to engage Pt and Pt became very irritable stating "I got it I don't need to keep hearing all this"  RN reviewed discharge instructions three times with Pt and Pt's Mom. Pt states "I got it" Discharge packet and hard Prescriptions with pt at discharge.

## 2019-07-19 NOTE — Discharge Summary (Signed)
Physician Discharge Summary  Deborah [name] VXY:[id number] DOB: Jul 29, 1965 DOA: 07/09/2019  PCP: [name], DO  Admit date: 07/09/2019 Discharge date: 07/19/2019  Admitted From: home Disposition:  home  Recommendations for Outpatient Follow-up:  1. Follow up with PCP in 1-2 weeks  Home Health: none Equipment/Devices: none  Discharge Condition: stable CODE STATUS: Full code Diet recommendation: heart healthy  HPI: Per admitting MD, Deborah [name] is a 54 y.o. female with medical history significant of chronic systolic CHF (EF 40 to 82% on echo done 01/2019), paroxysmal A. fib on Eliquis, insulin-dependent type 2 diabetes with history of recurrent admissions for DKA (most recent 4/23-4/28), CKD stage III, hypertension, hyperlipidemia, CVA, gout, diabetic gastroparesis intolerant to Reglan, gastric ulcer, GERD, anemia of chronic disease, chronic pain, depression, morbid obesity (BMI 54.07) presenting to the ED via EMS with complaints of nausea and vomiting.  Patient states yesterday afternoon she had a ham sandwich and soon after started vomiting.  She also had some substernal sharp chest pain and shortness of breath at that time.  She has continued to have 10 out of 10 intensity generalized abdominal pain since then.  Also continue to have some chest pain since then.  She has continued to vomit and has not been able to tolerate any p.o. intake.  Does report having a normal bowel movement in the afternoon.  She does not recall taking erythromycin since her recent hospital discharge.  States her blood glucose has been in the 200s at home.  Hospital Course / Discharge diagnoses: Principal Problem Abdominal pain, intractable nausea vomiting in the setting of diabetic gastroparesis -gastroenterology followed patient while hospitalized.  She was tried on several regimens somewhat complicated by her inability to tolerate Reglan, Phenergan, opiates and with periodic dystonic movements  of her head as described below.  Eventually she was placed on erythromycin and seemed to have responded to that.  Improved overall, was able to tolerate a regular diet, will be discharged home in stable condition with close outpatient follow-up with PCP as well as GI.  Active Problems Atypical chest pain-initial EKG without acute ischemic changes, high-sensitivity troponin overall flat not in a pattern consistent with ACS. Leukocytosis-resolved Dystonic movements of the head-previous hospitalist discussed with neurology, possibly related to opiate use/Phenergan/Reglan.  Discontinue all those. Avoid narcotics. Short course of Xanax worked well in the hospital and limited supply given on dc Lactic acidosis-resolved Hypokalemia/hypomagnesemia -repleted, stable Acute kidney injury on chronic kidney disease stage III-her baseline creatinine is around 1.1, currently has been stable around 1.4.?  New baseline for her.  To have further monitoring as an outpatient Poorly controlled DM2-hemoglobin A1c 8.4, continue Lantus, sliding scale. Essential hypertension-continue home medications Coronary artery disease-continue beta-blockers, statin Chronic systolic CHF-EF 70-78% in November 2020.  Euvolemic right now Paroxysmal A. fib-continue home beta-blocker, Eliquis Morbid obesity-patient will benefit from weight loss Depression-continue home medications Anemia of chronic disease, chronic kidney disease-no bleeding, hemoglobin overall stable   Discharge Instructions   Allergies as of 07/19/2019      Reactions   Contrast Media [iodinated Diagnostic Agents] Anaphylaxis   Cardiac arrest   Diazepam [name]   Isovue [iopamidol] Anaphylaxis   Patient had seizure like activity and then code post 100 cc of isovue 300   Lisinopril Anaphylaxis   Tongue and mouth swelling   Penicillins Palpitations   Has patient had a PCN reaction causing immediate rash, facial/tongue/throat swelling, SOB or  lightheadedness with hypotension: Yes, heart races Has patient had a PCN  reaction causing severe rash involving mucus membranes or skin necrosis: No Has patient had a PCN reaction that required hospitalization: Yes  Has patient had a PCN reaction occurring within the last 10 years: No   Acetaminophen Nausea Only, Other (See Comments)   Irritates stomach ulcer  Abdominal pain   Tolmetin Nausea Only, Other (See Comments)   ULCER   Aspirin Other (See Comments)   Irritates stomach ulcer    Nsaids Other (See Comments)   ULCER   Tramadol Nausea And Vomiting      Medication List    TAKE these medications   albuterol (2.5 MG/3ML) 0.083% nebulizer solution Commonly known as: PROVENTIL Take 3 mLs (2.5 mg total) by nebulization every 6 (six) hours as needed for wheezing or shortness of breath.   ALPRAZolam 1 MG tablet Commonly known as: XANAX Take 1 tablet (1 mg total) by mouth 3 (three) times daily as needed for up to 3 days for anxiety. Notes to patient: Last Dose of this Medication was given on 07/19/2019 at 9:44 an   apixaban 5 MG Tabs tablet Commonly known as: ELIQUIS Take 1 tablet (5 mg total) by mouth 2 (two) times daily. Notes to patient: Last Dose of this Medication was given on 07/19/2019 at 9:38 am   atorvastatin 10 MG tablet Commonly known as: LIPITOR Take 1 tablet (10 mg total) by mouth daily. Notes to patient: Last Dose of this Medication was given on 07/19/2019 at 9:38 am   blood glucose meter kit and supplies Kit Dispense based on patient and insurance preference. Use up to four times daily as directed. (FOR ICD-9 250.00, 250.01).   cetirizine 10 MG tablet Commonly known as: ZYRTEC Take 1 tablet (10 mg total) by mouth daily.   dicyclomine 20 MG tablet Commonly known as: BENTYL TAKE 1 TABLET (20 MG TOTAL) BY MOUTH 3 (THREE) TIMES DAILY AS NEEDED FOR SPASMS (ABDOMINAL CRAMPING).   DULoxetine HCl 40 MG Cpep TAKE ONE CAPSULE BY MOUTH TWICE DAILY What changed:   how  much to take  when to take this Notes to patient: Last Dose of this Medication was given on 07/19/2019 at 9:41 am   erythromycin 250 MG tablet Commonly known as: E-MYCIN Take 1 tablet (250 mg total) by mouth 4 (four) times daily -  with meals and at bedtime for 7 days. Notes to patient: This Medication is to be taken with meals 4 times a day. With meals and a snack at bedtime. Last Dose of this Medication was given on 07/19/2019 at 9:41 am   fluticasone 50 MCG/ACT nasal spray Commonly known as: FLONASE Place 2 sprays into both nostrils daily as needed for allergies or rhinitis.   hydrALAZINE 25 MG tablet Commonly known as: APRESOLINE TAKE ONE TABLET BY MOUTH THREE TIMES A DAY. FOR BLOOD PRESSURE What changed: See the new instructions. Notes to patient: This Medication is to be taken 3 times per day, Last Dose of this Medication was given on 07/19/2019 at 9:39 am   isosorbide mononitrate 60 MG 24 hr tablet Commonly known as: IMDUR Take 1 tablet (60 mg total) by mouth daily. Notes to patient: Last Dose of this Medication was given on 07/19/2019 at 9:38 am   Lantus SoloStar 100 UNIT/ML Solostar Pen Generic drug: insulin glargine Inject 45 Units into the skin at bedtime. Notes to patient: Please give Lantus injection tonight 07/19/2019 at bedtime   loperamide 2 MG capsule Commonly known as: IMODIUM Take 2 mg by mouth 4 (four) times daily  as needed for diarrhea or loose stools.   magnesium oxide 400 (241.3 Mg) MG tablet Commonly known as: MAG-OX Take 1 tablet (400 mg total) by mouth 2 (two) times daily. Notes to patient: Last Dose of this Medication was given on 07/19/2019 at 9:38 am   melatonin 3 MG Tabs tablet Take 2 tablets (6 mg total) by mouth at bedtime. Notes to patient: Please take this Medication tonight 07/19/2019 at bedtime   metoprolol succinate 25 MG 24 hr tablet Commonly known as: TOPROL-XL Take 25 mg by mouth daily. Notes to patient: Last Dose of this Medication was  given on 07/19/2019 at 9:38 am   nitroGLYCERIN 0.4 MG SL tablet Commonly known as: NITROSTAT Place 1 tablet (0.4 mg total) under the tongue every 5 (five) minutes as needed for chest pain.   NovoLOG FlexPen 100 UNIT/ML FlexPen Generic drug: insulin aspart Inject 15 Units into the skin 3 (three) times daily with meals. What changed: how much to take   ondansetron 4 MG disintegrating tablet Commonly known as: ZOFRAN-ODT Take 4 mg by mouth every 8 (eight) hours as needed for nausea or vomiting.   pantoprazole 40 MG tablet Commonly known as: PROTONIX Take 1 tablet (40 mg total) by mouth 2 (two) times daily. Notes to patient: Last Dose of this Medication was given on 07/19/2019 at 9:40 am   torsemide 20 MG tablet Commonly known as: DEMADEX Take 2 tablets (40 mg total) by mouth daily. Hold if having significant vomiting Notes to patient: Last Dose of this Medication was given on 07/19/2019 at 9:53 am For significant vomiting hold this Medication      Follow-up Information    [name], DO. Schedule an appointment as soon as possible for a visit in 1 week(s).   Specialty: Family Medicine Contact information: [address] [place] [address] [phone]        Sueanne Margarita, MD .   Specialty: Cardiology Contact information: [phone] [address] [place] [address] [phone]           Consultations:  GI  Procedures/Studies:  CT ABDOMEN PELVIS WO CONTRAST  Result Date: 07/10/2019 CLINICAL DATA:  Nausea and vomiting for 2 hours EXAM: CT ABDOMEN AND PELVIS WITHOUT CONTRAST TECHNIQUE: Multidetector CT imaging of the abdomen and pelvis was performed following the standard protocol without IV contrast. COMPARISON:  06/10/2019 FINDINGS: Lower chest:  No contributory findings. Hepatobiliary: No focal liver abnormality.Cholecystectomy without bile duct dilatation. Pancreas: Unremarkable. Spleen: Unremarkable. Adrenals/Urinary Tract: Negative  adrenals. No hydronephrosis or stone. Unremarkable bladder. Stomach/Bowel:  No obstruction. No appendicitis. Vascular/Lymphatic: No acute vascular abnormality. No mass or adenopathy. Reproductive:5 cm left adnexal mass is an exophytic fibroid by 2019 pelvic MRI. Pelvic floor laxity. Other: No ascites or pneumoperitoneum. Musculoskeletal: No acute abnormalities. Spondylosis and advanced lower lumbar facet osteoarthritis. Biforaminal impingement at L4-5 where there is jild anterolisthesis. IMPRESSION: 1. No acute finding including bowel obstruction or visible inflammation. 2. Chronic findings are stable from CT 1 month ago and described above. Electronically Signed   By: Monte Fantasia M.D.   On: 07/10/2019 04:55   DG Chest 2 View  Result Date: 06/29/2019 CLINICAL DATA:  Chest pain and shortness of breath. Diaphoresis. EXAM: CHEST - 2 VIEW COMPARISON:  06/22/2019 FINDINGS: Borderline enlarged cardiac silhouette. Clear lungs with normal vascularity. Thoracic spine degenerative changes. IMPRESSION: Borderline cardiomegaly. No acute abnormality. Electronically Signed   By: Claudie Revering M.D.   On: 06/29/2019 10:00   DG Chest 2 View  Result Date: 06/22/2019 CLINICAL DATA:  Vomiting, central chest and abdominal pain, history of diabetic ketoacidosis EXAM: CHEST - 2 VIEW COMPARISON:  06/10/2019 FINDINGS: The heart size and mediastinal contours are within normal limits. Both lungs are clear. The visualized skeletal structures are unremarkable. IMPRESSION: No active cardiopulmonary disease. Electronically Signed   By: Randa Ngo M.D.   On: 06/22/2019 19:24   DG CHEST PORT 1 VIEW  Result Date: 07/02/2019 CLINICAL DATA:  Dyspnea, history acute on chronic heart failure, hypertension, paroxysmal atrial fibrillation EXAM: PORTABLE CHEST 1 VIEW COMPARISON:  Portable exam 1316 hours compared to 06/29/2019 FINDINGS: Upper normal heart size. Mediastinal contours and pulmonary vascularity normal. Lungs clear. No  infiltrate, pleural effusion or pneumothorax. Bones demineralized with RIGHT glenohumeral degenerative changes noted. IMPRESSION: No acute abnormalities. Electronically Signed   By: Lavonia Dana M.D.   On: 07/02/2019 13:55   EEG adult  Result Date: 07/17/2019 Lora Havens, MD     07/17/2019  2:13 PM Patient Name: MATTILYNN FORRER MRN: [id number] Epilepsy Attending: Lora Havens Referring Physician/Provider: Dr. Anson Fret Date: 07/17/2019 Duration: 25.26 minutes Patient history: 54 year old female with tremor-like movements.  EEG to evaluate for seizures. Level of alertness: Awake, asleep AEDs during EEG study: Xanax Technical aspects: This EEG study was done with scalp electrodes positioned according to the 10-20 International system of electrode placement. Electrical activity was acquired at a sampling rate of '500Hz'  and reviewed with a high frequency filter of '70Hz'  and a low frequency filter of '1Hz' . EEG data were recorded continuously and digitally stored. Description: The posterior dominant rhythm consists of 8-9 Hz activity of moderate voltage (25-35 uV) seen predominantly in posterior head regions, symmetric and reactive to eye opening and eye closing.  Sleep was characterized by vertex waves, sleep spindles (12 to 14 Hz), maximal frontocentral region.  Physiologic photic driving was seen during photic stimulation.  Hyperventilation was not performed.        IMPRESSION: This study is within normal limits. No seizures or epileptiform discharges were seen throughout the recording. Priyanka [name]      Subjective: - no chest pain, shortness of breath, no abdominal pain, nausea or vomiting.   Discharge Exam: BP 124/68 (BP Location: Right Arm)   Pulse 88   Temp (!) 97.5 F (36.4 C) (Oral)   Resp 20   Ht '5\' 6"'  (1.676 m)   Wt (!) 151.1 kg   LMP 10/10/2012   SpO2 94%   BMI 53.76 kg/m   General: Pt is alert, awake, not in acute distress Cardiovascular: RRR, S1/S2 +, no rubs, no  gallops Respiratory: CTA bilaterally, no wheezing, no rhonchi Abdominal: Soft, NT, ND, bowel sounds + Extremities: no edema, no cyanosis   The results of significant diagnostics from this hospitalization (including imaging, microbiology, ancillary and laboratory) are listed below for reference.     Microbiology: Recent Results (from the past 240 hour(s))  Respiratory Panel by RT PCR (Flu A&B, Covid) - Nasopharyngeal Swab     Status: None   Collection Time: 07/10/19  1:19 AM   Specimen: Nasopharyngeal Swab  Result Value Ref Range Status   SARS Coronavirus 2 by RT PCR NEGATIVE NEGATIVE Final    Comment: (NOTE) SARS-CoV-2 target nucleic acids are NOT DETECTED. The SARS-CoV-2 RNA is generally detectable in upper respiratoy specimens during the acute phase of infection. The lowest concentration of SARS-CoV-2 viral copies this assay can detect is 131 copies/mL. A negative result does not preclude SARS-Cov-2 infection and should  not be used as the sole basis for treatment or other patient management decisions. A negative result may occur with  improper specimen collection/handling, submission of specimen other than nasopharyngeal swab, presence of viral mutation(s) within the areas targeted by this assay, and inadequate number of viral copies (<131 copies/mL). A negative result must be combined with clinical observations, patient history, and epidemiological information. The expected result is Negative. Fact Sheet for Patients:  PinkCheek.be Fact Sheet for Healthcare Providers:  GravelBags.it This test is not yet ap proved or cleared by the [place] FDA and  has been authorized for detection and/or diagnosis of SARS-CoV-2 by FDA under an Emergency Use Authorization (EUA). This EUA will remain  in effect (meaning this test can be used) for the duration of the COVID-19 declaration under Section 564(b)(1) of the Act, 21  U.S.C. section 360bbb-3(b)(1), unless the authorization is terminated or revoked sooner.    Influenza A by PCR NEGATIVE NEGATIVE Final   Influenza B by PCR NEGATIVE NEGATIVE Final    Comment: (NOTE) The Xpert Xpress SARS-CoV-2/FLU/RSV assay is intended as an aid in  the diagnosis of influenza from Nasopharyngeal swab specimens and  should not be used as a sole basis for treatment. Nasal washings and  aspirates are unacceptable for Xpert Xpress SARS-CoV-2/FLU/RSV  testing. Fact Sheet for Patients: PinkCheek.be Fact Sheet for Healthcare Providers: GravelBags.it This test is not yet approved or cleared by the [place] FDA and  has been authorized for detection and/or diagnosis of SARS-CoV-2 by  FDA under an Emergency Use Authorization (EUA). This EUA will remain  in effect (meaning this test can be used) for the duration of the  Covid-19 declaration under Section 564(b)(1) of the Act, 21  U.S.C. section 360bbb-3(b)(1), unless the authorization is  terminated or revoked. Performed at [facility], [place] [address] [place], [place] [address]   Culture, blood (routine x 2)     Status: None   Collection Time: 07/10/19  1:19 AM   Specimen: BLOOD  Result Value Ref Range Status   Specimen Description   Final    BLOOD BLOOD RIGHT FOREARM Performed at [facility] [address] [place], [place] [address]    Special Requests   Final    BOTTLES DRAWN AEROBIC AND ANAEROBIC Blood Culture adequate volume Performed at [facility] [address], [place], [place] [address]    Culture   Final    NO GROWTH 5 DAYS Performed at [facility], [place] [address] [place], [place] [address]    Report Status 07/15/2019 FINAL  Final  Culture, blood (routine x 2)     Status: None   Collection Time: 07/10/19  4:54 AM   Specimen: BLOOD  Result Value Ref Range Status   Specimen  Description   Final    BLOOD BLOOD LEFT FOREARM Performed at [facility] [address], [place], [place] [address]    Special Requests   Final    BOTTLES DRAWN AEROBIC AND ANAEROBIC Blood Culture adequate volume Performed at [facility] [address], [place], [place] [address]    Culture   Final    NO GROWTH 5 DAYS Performed at [facility], [place] [address], [place], [place] [address]    Report Status 07/15/2019 FINAL  Final     Labs: Basic Metabolic Panel: Recent Labs  Lab 07/14/19 0547 07/14/19 0547 07/15/19 0600 07/16/19 0402 07/17/19 0505 07/18/19 0419 07/19/19 0316  NA 139   < > 140 138 136 136 138  K 3.7   < > 3.6 3.2* 3.1* 3.4* 3.7  CL 103   < > 105 99 98 101 103  CO2 27   < > '26 28 26 26 27  ' GLUCOSE 212*   < > 220* 182* 273* 210* 166*  BUN 19   < > '15 15 20 ' 24* 24*  CREATININE 1.17*   < > 1.11* 1.28* 1.94* 1.45* 1.26*  CALCIUM 8.8*   < > 9.1 9.0 8.4* 8.1* 8.6*  MG 1.7  --  1.5* 1.4* 2.1 1.9  --   PHOS 2.6  --  2.9 3.2 4.2 3.3  --    < > = values in this interval not displayed.   Liver Function Tests: Recent Labs  Lab 07/14/19 0547 07/15/19 0600 07/16/19 0402 07/17/19 0505 07/18/19 0419  AST 10* 10* 9* 12* 10*  ALT '10 10 9 11 9  ' ALKPHOS 78 77 81 94 89  BILITOT 0.6 0.5 0.4 0.4 0.6  PROT 6.3* 6.4* 6.7 6.5 6.1*  ALBUMIN 2.7* 2.8* 2.9* 2.9* 2.7*   CBC: Recent Labs  Lab 07/14/19 0547 07/14/19 0547 07/15/19 0600 07/16/19 0402 07/17/19 0505 07/18/19 0419 07/19/19 0316  WBC 8.9   < > 10.1 10.8* 10.0 10.5 9.3  NEUTROABS 5.8  --  7.2 7.1 6.5 6.3  --   HGB 7.2*   < > 7.5* 8.0* 7.6* 7.2* 7.3*  HCT 24.2*   < > 25.0* 26.1* 25.1* 24.3* 24.4*  MCV 90.0   < > 89.0 88.2 87.8 88.7 89.4  PLT 403*   < > 443* 466* 425* 432* 418*   < > = values in this interval not displayed.   CBG: Recent Labs  Lab 07/18/19 1142 07/18/19 1642 07/18/19 2100 07/19/19 0744 07/19/19 1124  GLUCAP 191* 245* 331* 196* 268*    Hgb A1c No results for input(s): HGBA1C in the last 72 hours. Lipid Profile No results for input(s): CHOL, HDL, LDLCALC, TRIG, CHOLHDL, LDLDIRECT in the last 72 hours. Thyroid function studies No results for input(s): TSH, T4TOTAL, T3FREE, THYROIDAB in the last 72 hours.  Invalid input(s): FREET3 Urinalysis    Component Value Date/Time   COLORURINE STRAW (A) 06/29/2019 1313   APPEARANCEUR CLEAR 06/29/2019 1313   LABSPEC 1.007 06/29/2019 1313   PHURINE 5.0 06/29/2019 1313   GLUCOSEU >=500 (A) 06/29/2019 1313   HGBUR SMALL (A) 06/29/2019 1313   BILIRUBINUR NEGATIVE 06/29/2019 1313   KETONESUR NEGATIVE 06/29/2019 1313   PROTEINUR 100 (A) 06/29/2019 1313   UROBILINOGEN 0.2 10/02/2013 2108   NITRITE NEGATIVE 06/29/2019 1313   LEUKOCYTESUR NEGATIVE 06/29/2019 1313    FURTHER DISCHARGE INSTRUCTIONS:   Get Medicines reviewed and adjusted: Please take all your medications with you for your next visit with your Primary MD   Laboratory/radiological data: Please request your Primary MD to go over all hospital tests and procedure/radiological results at the follow up, please ask your Primary MD to get all Hospital records sent to his/her office.   In some cases, they will be blood work, cultures and biopsy results pending at the time of your discharge. Please request that your primary care M.D. goes through all the records of your hospital data and follows up on these results.   Also Note the following: If you experience worsening of your admission symptoms, develop shortness of breath, life threatening emergency, suicidal or homicidal thoughts you must seek medical attention immediately by calling 911 or calling  your MD immediately  if symptoms less severe.   You must read complete instructions/literature along with all the possible adverse reactions/side effects for all the Medicines you take and that have been prescribed to you. Take any new Medicines after you have completely  understood and accpet all the possible adverse reactions/side effects.    Do not drive when taking Pain medications or sleeping medications (Benzodaizepines)   Do not take more than prescribed Pain, Sleep and Anxiety Medications. It is not advisable to combine anxiety,sleep and pain medications without talking with your primary care practitioner   Special Instructions: If you have smoked or chewed Tobacco  in the last 2 yrs please stop smoking, stop any regular Alcohol  and or any Recreational drug use.   Wear Seat belts while driving.   Please note: You were cared for by a hospitalist during your hospital stay. Once you are discharged, your primary care physician will handle any further medical issues. Please note that NO REFILLS for any discharge medications will be authorized once you are discharged, as it is imperative that you return to your primary care physician (or establish a relationship with a primary care physician if you do not have one) for your post hospital discharge needs so that they can reassess your need for medications and monitor your lab values.  Time coordinating discharge: 35 minutes  SIGNED:  Marzetta Board, MD, PhD 07/19/2019, 1:46 PM

## 2019-07-20 MED ORDER — LIDOCAINE VISCOUS HCL 2 % MT SOLN
15.00 | OROMUCOSAL | Status: DC
Start: ? — End: 2019-07-20

## 2019-07-21 ENCOUNTER — Inpatient Hospital Stay (HOSPITAL_COMMUNITY)
Admission: EM | Admit: 2019-07-21 | Discharge: 2019-08-09 | DRG: 637 | Disposition: A | Discharge status: Home / Self Care | Payer: Medicare Other | Attending: Internal Medicine | Admitting: Internal Medicine

## 2019-07-21 ENCOUNTER — Other Ambulatory Visit: Payer: Self-pay

## 2019-07-21 ENCOUNTER — Emergency Department (HOSPITAL_COMMUNITY): Payer: Medicare Other

## 2019-07-21 ENCOUNTER — Encounter (HOSPITAL_COMMUNITY): Payer: Self-pay | Admitting: Emergency Medicine

## 2019-07-21 DIAGNOSIS — R0682 Tachypnea, not elsewhere classified: Secondary | ICD-10-CM | POA: Diagnosis present

## 2019-07-21 DIAGNOSIS — Z9849 Cataract extraction status, unspecified eye: Secondary | ICD-10-CM

## 2019-07-21 DIAGNOSIS — K648 Other hemorrhoids: Secondary | ICD-10-CM | POA: Diagnosis present

## 2019-07-21 DIAGNOSIS — F418 Other specified anxiety disorders: Secondary | ICD-10-CM | POA: Diagnosis present

## 2019-07-21 DIAGNOSIS — Z6841 Body Mass Index (BMI) 40.0 and over, adult: Secondary | ICD-10-CM

## 2019-07-21 DIAGNOSIS — F1120 Opioid dependence, uncomplicated: Secondary | ICD-10-CM | POA: Diagnosis present

## 2019-07-21 DIAGNOSIS — E860 Dehydration: Secondary | ICD-10-CM | POA: Diagnosis not present

## 2019-07-21 DIAGNOSIS — D125 Benign neoplasm of sigmoid colon: Secondary | ICD-10-CM | POA: Diagnosis present

## 2019-07-21 DIAGNOSIS — D638 Anemia in other chronic diseases classified elsewhere: Secondary | ICD-10-CM | POA: Diagnosis present

## 2019-07-21 DIAGNOSIS — Z79899 Other long term (current) drug therapy: Secondary | ICD-10-CM

## 2019-07-21 DIAGNOSIS — E1110 Type 2 diabetes mellitus with ketoacidosis without coma: Secondary | ICD-10-CM | POA: Diagnosis not present

## 2019-07-21 DIAGNOSIS — E872 Acidosis: Secondary | ICD-10-CM | POA: Diagnosis present

## 2019-07-21 DIAGNOSIS — E1143 Type 2 diabetes mellitus with diabetic autonomic (poly)neuropathy: Secondary | ICD-10-CM | POA: Diagnosis present

## 2019-07-21 DIAGNOSIS — N179 Acute kidney failure, unspecified: Secondary | ICD-10-CM | POA: Diagnosis present

## 2019-07-21 DIAGNOSIS — E1122 Type 2 diabetes mellitus with diabetic chronic kidney disease: Secondary | ICD-10-CM | POA: Diagnosis present

## 2019-07-21 DIAGNOSIS — I130 Hypertensive heart and chronic kidney disease with heart failure and stage 1 through stage 4 chronic kidney disease, or unspecified chronic kidney disease: Secondary | ICD-10-CM | POA: Diagnosis present

## 2019-07-21 DIAGNOSIS — G894 Chronic pain syndrome: Secondary | ICD-10-CM | POA: Diagnosis present

## 2019-07-21 DIAGNOSIS — E1100 Type 2 diabetes mellitus with hyperosmolarity without nonketotic hyperglycemic-hyperosmolar coma (NKHHC): Secondary | ICD-10-CM | POA: Diagnosis present

## 2019-07-21 DIAGNOSIS — D62 Acute posthemorrhagic anemia: Secondary | ICD-10-CM | POA: Diagnosis not present

## 2019-07-21 DIAGNOSIS — D3A010 Benign carcinoid tumor of the duodenum: Secondary | ICD-10-CM | POA: Diagnosis present

## 2019-07-21 DIAGNOSIS — J449 Chronic obstructive pulmonary disease, unspecified: Secondary | ICD-10-CM | POA: Diagnosis present

## 2019-07-21 DIAGNOSIS — Z9119 Patient's noncompliance with other medical treatment and regimen: Secondary | ICD-10-CM

## 2019-07-21 DIAGNOSIS — Z8249 Family history of ischemic heart disease and other diseases of the circulatory system: Secondary | ICD-10-CM

## 2019-07-21 DIAGNOSIS — R112 Nausea with vomiting, unspecified: Secondary | ICD-10-CM | POA: Diagnosis present

## 2019-07-21 DIAGNOSIS — K269 Duodenal ulcer, unspecified as acute or chronic, without hemorrhage or perforation: Secondary | ICD-10-CM | POA: Diagnosis present

## 2019-07-21 DIAGNOSIS — D3A8 Other benign neuroendocrine tumors: Secondary | ICD-10-CM | POA: Diagnosis present

## 2019-07-21 DIAGNOSIS — K219 Gastro-esophageal reflux disease without esophagitis: Secondary | ICD-10-CM | POA: Diagnosis present

## 2019-07-21 DIAGNOSIS — E785 Hyperlipidemia, unspecified: Secondary | ICD-10-CM | POA: Diagnosis present

## 2019-07-21 DIAGNOSIS — I10 Essential (primary) hypertension: Secondary | ICD-10-CM | POA: Diagnosis present

## 2019-07-21 DIAGNOSIS — Z9114 Patient's other noncompliance with medication regimen: Secondary | ICD-10-CM

## 2019-07-21 DIAGNOSIS — Z833 Family history of diabetes mellitus: Secondary | ICD-10-CM

## 2019-07-21 DIAGNOSIS — Z20822 Contact with and (suspected) exposure to covid-19: Secondary | ICD-10-CM | POA: Diagnosis present

## 2019-07-21 DIAGNOSIS — R739 Hyperglycemia, unspecified: Secondary | ICD-10-CM

## 2019-07-21 DIAGNOSIS — I69351 Hemiplegia and hemiparesis following cerebral infarction affecting right dominant side: Secondary | ICD-10-CM

## 2019-07-21 DIAGNOSIS — G2401 Drug induced subacute dyskinesia: Secondary | ICD-10-CM | POA: Diagnosis present

## 2019-07-21 DIAGNOSIS — N184 Chronic kidney disease, stage 4 (severe): Secondary | ICD-10-CM | POA: Diagnosis present

## 2019-07-21 DIAGNOSIS — M797 Fibromyalgia: Secondary | ICD-10-CM | POA: Diagnosis present

## 2019-07-21 DIAGNOSIS — K30 Functional dyspepsia: Secondary | ICD-10-CM | POA: Diagnosis present

## 2019-07-21 DIAGNOSIS — E876 Hypokalemia: Secondary | ICD-10-CM | POA: Diagnosis present

## 2019-07-21 DIAGNOSIS — K449 Diaphragmatic hernia without obstruction or gangrene: Secondary | ICD-10-CM | POA: Diagnosis present

## 2019-07-21 DIAGNOSIS — M109 Gout, unspecified: Secondary | ICD-10-CM | POA: Diagnosis present

## 2019-07-21 DIAGNOSIS — K3184 Gastroparesis: Secondary | ICD-10-CM | POA: Diagnosis present

## 2019-07-21 DIAGNOSIS — C7A010 Malignant carcinoid tumor of the duodenum: Secondary | ICD-10-CM

## 2019-07-21 DIAGNOSIS — IMO0002 Reserved for concepts with insufficient information to code with codable children: Secondary | ICD-10-CM

## 2019-07-21 DIAGNOSIS — N1832 Chronic kidney disease, stage 3b: Secondary | ICD-10-CM | POA: Diagnosis present

## 2019-07-21 DIAGNOSIS — E1165 Type 2 diabetes mellitus with hyperglycemia: Secondary | ICD-10-CM | POA: Diagnosis present

## 2019-07-21 DIAGNOSIS — I639 Cerebral infarction, unspecified: Secondary | ICD-10-CM | POA: Diagnosis present

## 2019-07-21 DIAGNOSIS — Z7901 Long term (current) use of anticoagulants: Secondary | ICD-10-CM

## 2019-07-21 DIAGNOSIS — Z886 Allergy status to analgesic agent status: Secondary | ICD-10-CM

## 2019-07-21 DIAGNOSIS — F329 Major depressive disorder, single episode, unspecified: Secondary | ICD-10-CM | POA: Diagnosis present

## 2019-07-21 DIAGNOSIS — I480 Paroxysmal atrial fibrillation: Secondary | ICD-10-CM | POA: Diagnosis present

## 2019-07-21 DIAGNOSIS — I5022 Chronic systolic (congestive) heart failure: Secondary | ICD-10-CM | POA: Diagnosis present

## 2019-07-21 DIAGNOSIS — J441 Chronic obstructive pulmonary disease with (acute) exacerbation: Secondary | ICD-10-CM

## 2019-07-21 DIAGNOSIS — R000 Tachycardia, unspecified: Secondary | ICD-10-CM | POA: Diagnosis present

## 2019-07-21 DIAGNOSIS — F419 Anxiety disorder, unspecified: Secondary | ICD-10-CM | POA: Diagnosis present

## 2019-07-21 DIAGNOSIS — Z8711 Personal history of peptic ulcer disease: Secondary | ICD-10-CM

## 2019-07-21 DIAGNOSIS — Z765 Malingerer [conscious simulation]: Secondary | ICD-10-CM

## 2019-07-21 DIAGNOSIS — Z794 Long term (current) use of insulin: Secondary | ICD-10-CM

## 2019-07-21 DIAGNOSIS — K529 Noninfective gastroenteritis and colitis, unspecified: Secondary | ICD-10-CM

## 2019-07-21 DIAGNOSIS — N1831 Chronic kidney disease, stage 3a: Secondary | ICD-10-CM | POA: Diagnosis present

## 2019-07-21 DIAGNOSIS — E1140 Type 2 diabetes mellitus with diabetic neuropathy, unspecified: Secondary | ICD-10-CM

## 2019-07-21 DIAGNOSIS — R109 Unspecified abdominal pain: Secondary | ICD-10-CM

## 2019-07-21 DIAGNOSIS — D509 Iron deficiency anemia, unspecified: Secondary | ICD-10-CM | POA: Diagnosis present

## 2019-07-21 DIAGNOSIS — E873 Alkalosis: Secondary | ICD-10-CM | POA: Diagnosis present

## 2019-07-21 MED ORDER — DROPERIDOL 2.5 MG/ML IJ SOLN
2.5000 mg | Freq: Once | INTRAMUSCULAR | Status: AC
  Administered 2019-07-22: 2.5 mg via INTRAMUSCULAR
  Filled 2019-07-21: qty 2

## 2019-07-21 MED ORDER — LACTATED RINGERS IV BOLUS
1000.0000 mL | Freq: Once | INTRAVENOUS | Status: AC
  Administered 2019-07-22: 1000 mL via INTRAVENOUS

## 2019-07-21 MED ORDER — ONDANSETRON 4 MG PO TBDP
4.0000 mg | ORAL_TABLET | Freq: Once | ORAL | Status: AC | PRN
  Administered 2019-07-21: 4 mg via ORAL
  Filled 2019-07-21: qty 1

## 2019-07-21 NOTE — ED Triage Notes (Signed)
Patient reports central chest pain with vomiting x1 hour.   Upon entering room to triage patient, patient noted to be self inducing vomiting.

## 2019-07-21 NOTE — ED Notes (Signed)
Unsuccessful blood draw x1. Patient uncooperative for additional attempts.

## 2019-07-22 ENCOUNTER — Inpatient Hospital Stay (HOSPITAL_COMMUNITY): Payer: Medicare Other

## 2019-07-22 ENCOUNTER — Other Ambulatory Visit: Payer: Self-pay

## 2019-07-22 DIAGNOSIS — F1120 Opioid dependence, uncomplicated: Secondary | ICD-10-CM | POA: Diagnosis not present

## 2019-07-22 DIAGNOSIS — R0682 Tachypnea, not elsewhere classified: Secondary | ICD-10-CM | POA: Diagnosis present

## 2019-07-22 DIAGNOSIS — Z886 Allergy status to analgesic agent status: Secondary | ICD-10-CM | POA: Diagnosis not present

## 2019-07-22 DIAGNOSIS — D62 Acute posthemorrhagic anemia: Secondary | ICD-10-CM | POA: Diagnosis not present

## 2019-07-22 DIAGNOSIS — E873 Alkalosis: Secondary | ICD-10-CM | POA: Diagnosis not present

## 2019-07-22 DIAGNOSIS — K3184 Gastroparesis: Secondary | ICD-10-CM

## 2019-07-22 DIAGNOSIS — E1110 Type 2 diabetes mellitus with ketoacidosis without coma: Secondary | ICD-10-CM | POA: Diagnosis not present

## 2019-07-22 DIAGNOSIS — D3A8 Other benign neuroendocrine tumors: Secondary | ICD-10-CM | POA: Diagnosis present

## 2019-07-22 DIAGNOSIS — R1010 Upper abdominal pain, unspecified: Secondary | ICD-10-CM | POA: Diagnosis not present

## 2019-07-22 DIAGNOSIS — E1122 Type 2 diabetes mellitus with diabetic chronic kidney disease: Secondary | ICD-10-CM | POA: Diagnosis present

## 2019-07-22 DIAGNOSIS — R109 Unspecified abdominal pain: Secondary | ICD-10-CM | POA: Diagnosis not present

## 2019-07-22 DIAGNOSIS — E1100 Type 2 diabetes mellitus with hyperosmolarity without nonketotic hyperglycemic-hyperosmolar coma (NKHHC): Secondary | ICD-10-CM | POA: Diagnosis present

## 2019-07-22 DIAGNOSIS — R112 Nausea with vomiting, unspecified: Secondary | ICD-10-CM

## 2019-07-22 DIAGNOSIS — E1165 Type 2 diabetes mellitus with hyperglycemia: Secondary | ICD-10-CM

## 2019-07-22 DIAGNOSIS — N182 Chronic kidney disease, stage 2 (mild): Secondary | ICD-10-CM

## 2019-07-22 DIAGNOSIS — K219 Gastro-esophageal reflux disease without esophagitis: Secondary | ICD-10-CM | POA: Diagnosis present

## 2019-07-22 DIAGNOSIS — I5022 Chronic systolic (congestive) heart failure: Secondary | ICD-10-CM | POA: Diagnosis not present

## 2019-07-22 DIAGNOSIS — I480 Paroxysmal atrial fibrillation: Secondary | ICD-10-CM | POA: Diagnosis present

## 2019-07-22 DIAGNOSIS — E1143 Type 2 diabetes mellitus with diabetic autonomic (poly)neuropathy: Secondary | ICD-10-CM

## 2019-07-22 DIAGNOSIS — I700 Atherosclerosis of aorta: Secondary | ICD-10-CM | POA: Diagnosis not present

## 2019-07-22 DIAGNOSIS — Z765 Malingerer [conscious simulation]: Secondary | ICD-10-CM

## 2019-07-22 DIAGNOSIS — I10 Essential (primary) hypertension: Secondary | ICD-10-CM | POA: Diagnosis not present

## 2019-07-22 DIAGNOSIS — F329 Major depressive disorder, single episode, unspecified: Secondary | ICD-10-CM | POA: Diagnosis present

## 2019-07-22 DIAGNOSIS — E872 Acidosis: Secondary | ICD-10-CM

## 2019-07-22 DIAGNOSIS — I639 Cerebral infarction, unspecified: Secondary | ICD-10-CM | POA: Diagnosis not present

## 2019-07-22 DIAGNOSIS — K529 Noninfective gastroenteritis and colitis, unspecified: Secondary | ICD-10-CM | POA: Diagnosis not present

## 2019-07-22 DIAGNOSIS — E860 Dehydration: Secondary | ICD-10-CM | POA: Diagnosis present

## 2019-07-22 DIAGNOSIS — N1831 Chronic kidney disease, stage 3a: Secondary | ICD-10-CM | POA: Diagnosis present

## 2019-07-22 DIAGNOSIS — Z794 Long term (current) use of insulin: Secondary | ICD-10-CM

## 2019-07-22 DIAGNOSIS — Z7901 Long term (current) use of anticoagulants: Secondary | ICD-10-CM | POA: Diagnosis not present

## 2019-07-22 DIAGNOSIS — C7A010 Malignant carcinoid tumor of the duodenum: Secondary | ICD-10-CM | POA: Diagnosis not present

## 2019-07-22 DIAGNOSIS — Z6841 Body Mass Index (BMI) 40.0 and over, adult: Secondary | ICD-10-CM | POA: Diagnosis not present

## 2019-07-22 DIAGNOSIS — Z20822 Contact with and (suspected) exposure to covid-19: Secondary | ICD-10-CM | POA: Diagnosis not present

## 2019-07-22 DIAGNOSIS — I69351 Hemiplegia and hemiparesis following cerebral infarction affecting right dominant side: Secondary | ICD-10-CM | POA: Diagnosis not present

## 2019-07-22 DIAGNOSIS — Z9119 Patient's noncompliance with other medical treatment and regimen: Secondary | ICD-10-CM | POA: Diagnosis not present

## 2019-07-22 DIAGNOSIS — I2510 Atherosclerotic heart disease of native coronary artery without angina pectoris: Secondary | ICD-10-CM | POA: Diagnosis not present

## 2019-07-22 DIAGNOSIS — E119 Type 2 diabetes mellitus without complications: Secondary | ICD-10-CM | POA: Diagnosis not present

## 2019-07-22 DIAGNOSIS — F419 Anxiety disorder, unspecified: Secondary | ICD-10-CM | POA: Diagnosis not present

## 2019-07-22 DIAGNOSIS — R000 Tachycardia, unspecified: Secondary | ICD-10-CM | POA: Diagnosis present

## 2019-07-22 DIAGNOSIS — R739 Hyperglycemia, unspecified: Secondary | ICD-10-CM

## 2019-07-22 DIAGNOSIS — E1140 Type 2 diabetes mellitus with diabetic neuropathy, unspecified: Secondary | ICD-10-CM

## 2019-07-22 DIAGNOSIS — N179 Acute kidney failure, unspecified: Secondary | ICD-10-CM | POA: Diagnosis not present

## 2019-07-22 DIAGNOSIS — I130 Hypertensive heart and chronic kidney disease with heart failure and stage 1 through stage 4 chronic kidney disease, or unspecified chronic kidney disease: Secondary | ICD-10-CM | POA: Diagnosis not present

## 2019-07-22 LAB — CBC WITH DIFFERENTIAL/PLATELET
Abs Immature Granulocytes: 0.13 10*3/uL — ABNORMAL HIGH (ref 0.00–0.07)
Basophils Absolute: 0.1 10*3/uL (ref 0.0–0.1)
Basophils Relative: 0 %
Eosinophils Absolute: 0.1 10*3/uL (ref 0.0–0.5)
Eosinophils Relative: 0 %
HCT: 30 % — ABNORMAL LOW (ref 36.0–46.0)
Hemoglobin: 9 g/dL — ABNORMAL LOW (ref 12.0–15.0)
Immature Granulocytes: 1 %
Lymphocytes Relative: 13 %
Lymphs Abs: 2.3 10*3/uL (ref 0.7–4.0)
MCH: 26.9 pg (ref 26.0–34.0)
MCHC: 30 g/dL (ref 30.0–36.0)
MCV: 89.8 fL (ref 80.0–100.0)
Monocytes Absolute: 0.9 10*3/uL (ref 0.1–1.0)
Monocytes Relative: 5 %
Neutro Abs: 14.7 10*3/uL — ABNORMAL HIGH (ref 1.7–7.7)
Neutrophils Relative %: 81 %
Platelets: 482 10*3/uL — ABNORMAL HIGH (ref 150–400)
RBC: 3.34 MIL/uL — ABNORMAL LOW (ref 3.87–5.11)
RDW: 15.5 % (ref 11.5–15.5)
WBC: 18.1 10*3/uL — ABNORMAL HIGH (ref 4.0–10.5)
nRBC: 0 % (ref 0.0–0.2)

## 2019-07-22 LAB — BASIC METABOLIC PANEL
Anion gap: 8 (ref 5–15)
Anion gap: 13 (ref 5–15)
BUN: 18 mg/dL (ref 6–20)
BUN: 19 mg/dL (ref 6–20)
CO2: 30 mmol/L (ref 22–32)
CO2: 27 mmol/L (ref 22–32)
Calcium: 8.4 mg/dL — ABNORMAL LOW (ref 8.9–10.3)
Calcium: 8.4 mg/dL — ABNORMAL LOW (ref 8.9–10.3)
Chloride: 102 mmol/L (ref 98–111)
Chloride: 99 mmol/L (ref 98–111)
Creatinine, Ser: 1.54 mg/dL — ABNORMAL HIGH (ref 0.44–1.00)
Creatinine, Ser: 1.86 mg/dL — ABNORMAL HIGH (ref 0.44–1.00)
GFR calc Af Amer: 44 mL/min — ABNORMAL LOW (ref >60)
GFR calc Af Amer: 35 mL/min — ABNORMAL LOW (ref >60)
GFR calc non Af Amer: 38 mL/min — ABNORMAL LOW (ref >60)
GFR calc non Af Amer: 30 mL/min — ABNORMAL LOW (ref >60)
Glucose, Bld: 200 mg/dL — ABNORMAL HIGH (ref 70–99)
Glucose, Bld: 165 mg/dL — ABNORMAL HIGH (ref 70–99)
Potassium: 3.2 mmol/L — ABNORMAL LOW (ref 3.5–5.1)
Potassium: 3.6 mmol/L (ref 3.5–5.1)
Sodium: 140 mmol/L (ref 135–145)
Sodium: 139 mmol/L (ref 135–145)

## 2019-07-22 LAB — COMPREHENSIVE METABOLIC PANEL
ALT: 13 U/L (ref 0–44)
AST: 21 U/L (ref 15–41)
Albumin: 3.6 g/dL (ref 3.5–5.0)
Alkaline Phosphatase: 101 U/L (ref 38–126)
Anion gap: 20 — ABNORMAL HIGH (ref 5–15)
BUN: 25 mg/dL — ABNORMAL HIGH (ref 6–20)
CO2: 21 mmol/L — ABNORMAL LOW (ref 22–32)
Calcium: 8.9 mg/dL (ref 8.9–10.3)
Chloride: 92 mmol/L — ABNORMAL LOW (ref 98–111)
Creatinine, Ser: 1.65 mg/dL — ABNORMAL HIGH (ref 0.44–1.00)
GFR calc Af Amer: 40 mL/min — ABNORMAL LOW (ref >60)
GFR calc non Af Amer: 35 mL/min — ABNORMAL LOW (ref >60)
Glucose, Bld: 411 mg/dL — ABNORMAL HIGH (ref 70–99)
Potassium: 3.8 mmol/L (ref 3.5–5.1)
Sodium: 133 mmol/L — ABNORMAL LOW (ref 135–145)
Total Bilirubin: 1 mg/dL (ref 0.3–1.2)
Total Protein: 7.8 g/dL (ref 6.5–8.1)

## 2019-07-22 LAB — CBC
HCT: 30 % — ABNORMAL LOW (ref 36.0–46.0)
Hemoglobin: 9.2 g/dL — ABNORMAL LOW (ref 12.0–15.0)
MCH: 26.9 pg (ref 26.0–34.0)
MCHC: 30.7 g/dL (ref 30.0–36.0)
MCV: 87.7 fL (ref 80.0–100.0)
Platelets: 560 10*3/uL — ABNORMAL HIGH (ref 150–400)
RBC: 3.42 MIL/uL — ABNORMAL LOW (ref 3.87–5.11)
RDW: 15.2 % (ref 11.5–15.5)
WBC: 17.6 10*3/uL — ABNORMAL HIGH (ref 4.0–10.5)
nRBC: 0 % (ref 0.0–0.2)

## 2019-07-22 LAB — I-STAT BETA HCG BLOOD, ED (MC, WL, AP ONLY): I-stat hCG, quantitative: <5 m[IU]/mL (ref <5)

## 2019-07-22 LAB — URINALYSIS, ROUTINE W REFLEX MICROSCOPIC
Bacteria, UA: NONE SEEN
Bilirubin Urine: NEGATIVE
Glucose, UA: >=500 mg/dL — AB
Ketones, ur: 20 mg/dL — AB
Leukocytes,Ua: NEGATIVE
Nitrite: NEGATIVE
Protein, ur: >=300 mg/dL — AB
Specific Gravity, Urine: 1.012 (ref 1.005–1.030)
pH: 6 (ref 5.0–8.0)

## 2019-07-22 LAB — LIPID PANEL
Cholesterol: 104 mg/dL (ref 0–200)
HDL: 30 mg/dL — ABNORMAL LOW (ref >40)
LDL Cholesterol: 37 mg/dL (ref 0–99)
Total CHOL/HDL Ratio: 3.5 RATIO
Triglycerides: 185 mg/dL — ABNORMAL HIGH (ref <150)
VLDL: 37 mg/dL (ref 0–40)

## 2019-07-22 LAB — BLOOD GAS, VENOUS
Acid-Base Excess: 1.5 mmol/L (ref 0.0–2.0)
Bicarbonate: 22.8 mmol/L (ref 20.0–28.0)
O2 Saturation: 84 %
pCO2, Ven: 25 mmHg — ABNORMAL LOW (ref 44.0–60.0)
pH, Ven: 7.567 — ABNORMAL HIGH (ref 7.250–7.430)
pO2, Ven: 44.8 mmHg (ref 32.0–45.0)

## 2019-07-22 LAB — CBG MONITORING, ED
Glucose-Capillary: 424 mg/dL — ABNORMAL HIGH (ref 70–99)
Glucose-Capillary: 305 mg/dL — ABNORMAL HIGH (ref 70–99)
Glucose-Capillary: 226 mg/dL — ABNORMAL HIGH (ref 70–99)
Glucose-Capillary: 181 mg/dL — ABNORMAL HIGH (ref 70–99)
Glucose-Capillary: 158 mg/dL — ABNORMAL HIGH (ref 70–99)
Glucose-Capillary: 153 mg/dL — ABNORMAL HIGH (ref 70–99)
Glucose-Capillary: 162 mg/dL — ABNORMAL HIGH (ref 70–99)
Glucose-Capillary: 205 mg/dL — ABNORMAL HIGH (ref 70–99)
Glucose-Capillary: 187 mg/dL — ABNORMAL HIGH (ref 70–99)

## 2019-07-22 LAB — HEMOGLOBIN A1C
Hgb A1c MFr Bld: 8.7 % — ABNORMAL HIGH (ref 4.8–5.6)
Mean Plasma Glucose: 202.99 mg/dL

## 2019-07-22 LAB — HIV ANTIBODY (ROUTINE TESTING W REFLEX): HIV Screen 4th Generation wRfx: NONREACTIVE

## 2019-07-22 LAB — TROPONIN I (HIGH SENSITIVITY)
Troponin I (High Sensitivity): 27 ng/L — ABNORMAL HIGH (ref <18)
Troponin I (High Sensitivity): 48 ng/L — ABNORMAL HIGH (ref <18)

## 2019-07-22 LAB — LACTIC ACID, PLASMA
Lactic Acid, Venous: 3.2 mmol/L (ref 0.5–1.9)
Lactic Acid, Venous: 4.1 mmol/L (ref 0.5–1.9)
Lactic Acid, Venous: 5.5 mmol/L (ref 0.5–1.9)

## 2019-07-22 LAB — LIPASE, BLOOD: Lipase: 23 U/L (ref 11–51)

## 2019-07-22 LAB — MAGNESIUM: Magnesium: 1.5 mg/dL — ABNORMAL LOW (ref 1.7–2.4)

## 2019-07-22 LAB — TSH: TSH: 2.576 u[IU]/mL (ref 0.350–4.500)

## 2019-07-22 LAB — OSMOLALITY: Osmolality: 296 mOsm/kg — ABNORMAL HIGH (ref 275–295)

## 2019-07-22 LAB — GLUCOSE, CAPILLARY: Glucose-Capillary: 139 mg/dL — ABNORMAL HIGH (ref 70–99)

## 2019-07-22 LAB — PHOSPHORUS: Phosphorus: 3.1 mg/dL (ref 2.5–4.6)

## 2019-07-22 MED ORDER — ONDANSETRON HCL 4 MG/2ML IJ SOLN
4.0000 mg | Freq: Four times a day (QID) | INTRAMUSCULAR | Status: DC | PRN
  Administered 2019-07-22 – 2019-07-23 (×2): 4 mg via INTRAVENOUS
  Filled 2019-07-22 (×2): qty 2

## 2019-07-22 MED ORDER — ALBUTEROL SULFATE (2.5 MG/3ML) 0.083% IN NEBU: 2.5000 mg | INHALATION_SOLUTION | Freq: Four times a day (QID) | RESPIRATORY_TRACT | Status: DC | PRN

## 2019-07-22 MED ORDER — CIPROFLOXACIN IN D5W 400 MG/200ML IV SOLN
400.0000 mg | Freq: Two times a day (BID) | INTRAVENOUS | Status: DC
  Administered 2019-07-22 (×2): 400 mg via INTRAVENOUS
  Filled 2019-07-22 (×3): qty 200

## 2019-07-22 MED ORDER — HYDRALAZINE HCL 25 MG PO TABS
25.0000 mg | ORAL_TABLET | Freq: Three times a day (TID) | ORAL | Status: DC
  Administered 2019-07-22: 25 mg via ORAL
  Filled 2019-07-22: qty 1

## 2019-07-22 MED ORDER — MORPHINE SULFATE (PF) 2 MG/ML IV SOLN
2.0000 mg | INTRAVENOUS | Status: DC | PRN
  Administered 2019-07-22 – 2019-07-23 (×5): 2 mg via INTRAVENOUS
  Filled 2019-07-22 (×5): qty 1

## 2019-07-22 MED ORDER — NITROGLYCERIN 0.4 MG SL SUBL: 0.4000 mg | SUBLINGUAL_TABLET | SUBLINGUAL | Status: DC | PRN

## 2019-07-22 MED ORDER — INSULIN GLARGINE 100 UNIT/ML ~~LOC~~ SOLN: 45.0000 [IU] | Freq: Every day | SUBCUTANEOUS | Status: DC

## 2019-07-22 MED ORDER — ALUM & MAG HYDROXIDE-SIMETH 200-200-20 MG/5ML PO SUSP
30.0000 mL | Freq: Once | ORAL | Status: AC
  Administered 2019-07-22: 30 mL via ORAL
  Filled 2019-07-22: qty 30

## 2019-07-22 MED ORDER — LACTATED RINGERS IV BOLUS
500.0000 mL | Freq: Once | INTRAVENOUS | Status: AC
  Administered 2019-07-22: 500 mL via INTRAVENOUS

## 2019-07-22 MED ORDER — HYDRALAZINE HCL 20 MG/ML IJ SOLN
10.0000 mg | Freq: Four times a day (QID) | INTRAMUSCULAR | Status: DC | PRN
  Administered 2019-07-23: 10 mg via INTRAVENOUS
  Filled 2019-07-22 (×3): qty 1

## 2019-07-22 MED ORDER — DICYCLOMINE HCL 10 MG/5ML PO SOLN
10.0000 mg | Freq: Once | ORAL | Status: DC
  Filled 2019-07-22: qty 5

## 2019-07-22 MED ORDER — PANTOPRAZOLE SODIUM 40 MG IV SOLR
40.0000 mg | INTRAVENOUS | Status: DC
  Administered 2019-07-22 – 2019-07-23 (×2): 40 mg via INTRAVENOUS
  Filled 2019-07-22 (×2): qty 40

## 2019-07-22 MED ORDER — INSULIN ASPART 100 UNIT/ML ~~LOC~~ SOLN
5.0000 [IU] | Freq: Once | SUBCUTANEOUS | Status: AC
  Administered 2019-07-22: 5 [IU] via INTRAVENOUS
  Filled 2019-07-22: qty 0.05

## 2019-07-22 MED ORDER — SODIUM CHLORIDE 0.9 % IV SOLN: INTRAVENOUS | Status: AC

## 2019-07-22 MED ORDER — ISOSORBIDE MONONITRATE ER 60 MG PO TB24
60.0000 mg | ORAL_TABLET | Freq: Every day | ORAL | Status: DC
  Administered 2019-07-22 – 2019-08-09 (×18): 60 mg via ORAL
  Filled 2019-07-22 (×20): qty 1

## 2019-07-22 MED ORDER — POTASSIUM CHLORIDE 10 MEQ/100ML IV SOLN: 10.0000 meq | INTRAVENOUS | Status: DC

## 2019-07-22 MED ORDER — INSULIN ASPART 100 UNIT/ML ~~LOC~~ SOLN
30.0000 [IU] | Freq: Three times a day (TID) | SUBCUTANEOUS | Status: DC
  Administered 2019-07-22: 30 [IU] via SUBCUTANEOUS
  Filled 2019-07-22: qty 0.3

## 2019-07-22 MED ORDER — POTASSIUM CHLORIDE 10 MEQ/100ML IV SOLN
10.0000 meq | INTRAVENOUS | Status: AC
  Administered 2019-07-22 – 2019-07-23 (×5): 10 meq via INTRAVENOUS
  Filled 2019-07-22 (×5): qty 100

## 2019-07-22 MED ORDER — DEXTROSE-NACL 5-0.45 % IV SOLN: INTRAVENOUS | Status: DC

## 2019-07-22 MED ORDER — METOPROLOL SUCCINATE ER 25 MG PO TB24
25.0000 mg | ORAL_TABLET | Freq: Every day | ORAL | Status: DC
  Administered 2019-07-22 – 2019-07-24 (×3): 25 mg via ORAL
  Filled 2019-07-22 (×3): qty 1

## 2019-07-22 MED ORDER — METOPROLOL TARTRATE 5 MG/5ML IV SOLN
5.0000 mg | Freq: Four times a day (QID) | INTRAVENOUS | Status: DC | PRN
  Administered 2019-07-22 – 2019-07-23 (×2): 5 mg via INTRAVENOUS
  Filled 2019-07-22 (×2): qty 5

## 2019-07-22 MED ORDER — DEXTROSE 50 % IV SOLN: 0.0000 mL | INTRAVENOUS | Status: DC | PRN

## 2019-07-22 MED ORDER — SODIUM CHLORIDE 0.9 % IV SOLN: INTRAVENOUS | Status: DC

## 2019-07-22 MED ORDER — APIXABAN 5 MG PO TABS
5.0000 mg | ORAL_TABLET | Freq: Two times a day (BID) | ORAL | Status: DC
  Administered 2019-07-22 – 2019-07-29 (×16): 5 mg via ORAL
  Filled 2019-07-22 (×16): qty 1

## 2019-07-22 MED ORDER — METRONIDAZOLE IN NACL 5-0.79 MG/ML-% IV SOLN
500.0000 mg | Freq: Three times a day (TID) | INTRAVENOUS | Status: DC
  Administered 2019-07-22 (×3): 500 mg via INTRAVENOUS
  Filled 2019-07-22 (×4): qty 100

## 2019-07-22 MED ORDER — ONDANSETRON HCL 4 MG PO TABS: 4.0000 mg | ORAL_TABLET | Freq: Four times a day (QID) | ORAL | Status: DC | PRN

## 2019-07-22 MED ORDER — INSULIN ASPART 100 UNIT/ML ~~LOC~~ SOLN
0.0000 [IU] | SUBCUTANEOUS | Status: DC
  Administered 2019-07-22: 20 [IU] via SUBCUTANEOUS
  Administered 2019-07-22: 0 [IU] via SUBCUTANEOUS
  Filled 2019-07-22: qty 0.2

## 2019-07-22 MED ORDER — CHLORHEXIDINE GLUCONATE CLOTH 2 % EX PADS
6.0000 | MEDICATED_PAD | Freq: Every day | CUTANEOUS | Status: DC
  Administered 2019-07-23 – 2019-08-01 (×7): 6 via TOPICAL

## 2019-07-22 MED ORDER — ALPRAZOLAM 1 MG PO TABS
1.0000 mg | ORAL_TABLET | Freq: Three times a day (TID) | ORAL | Status: DC | PRN
  Administered 2019-07-22: 1 mg via ORAL
  Filled 2019-07-22: qty 2

## 2019-07-22 MED ORDER — FENTANYL CITRATE (PF) 100 MCG/2ML IJ SOLN
50.0000 ug | Freq: Once | INTRAMUSCULAR | Status: AC
  Administered 2019-07-22: 50 ug via INTRAVENOUS
  Filled 2019-07-22: qty 2

## 2019-07-22 MED ORDER — POTASSIUM CHLORIDE 10 MEQ/100ML IV SOLN
10.0000 meq | INTRAVENOUS | Status: AC
  Administered 2019-07-22 (×2): 10 meq via INTRAVENOUS
  Filled 2019-07-22: qty 100

## 2019-07-22 MED ORDER — INSULIN REGULAR(HUMAN) IN NACL 100-0.9 UT/100ML-% IV SOLN
INTRAVENOUS | Status: DC
  Administered 2019-07-22: 13 [IU]/h via INTRAVENOUS
  Filled 2019-07-22: qty 100

## 2019-07-22 MED ORDER — LACTATED RINGERS IV BOLUS
1000.0000 mL | Freq: Once | INTRAVENOUS | Status: AC
  Administered 2019-07-22: 1000 mL via INTRAVENOUS

## 2019-07-22 MED ORDER — DULOXETINE HCL 20 MG PO CPEP
40.0000 mg | ORAL_CAPSULE | Freq: Every day | ORAL | Status: DC
  Administered 2019-07-22 – 2019-08-09 (×16): 40 mg via ORAL
  Filled 2019-07-22 (×19): qty 2

## 2019-07-22 MED ORDER — HYDRALAZINE HCL 20 MG/ML IJ SOLN
5.0000 mg | Freq: Four times a day (QID) | INTRAMUSCULAR | Status: DC
  Administered 2019-07-22 – 2019-08-09 (×68): 5 mg via INTRAVENOUS
  Filled 2019-07-22 (×63): qty 1

## 2019-07-22 MED ORDER — ONDANSETRON HCL 4 MG/2ML IJ SOLN
4.0000 mg | Freq: Once | INTRAMUSCULAR | Status: AC
  Administered 2019-07-22: 4 mg via INTRAVENOUS
  Filled 2019-07-22: qty 2

## 2019-07-22 MED ORDER — SODIUM CHLORIDE 0.9 % IV SOLN
25.0000 mg | Freq: Once | INTRAVENOUS | Status: AC
  Administered 2019-07-22: 25 mg via INTRAVENOUS
  Filled 2019-07-22: qty 1

## 2019-07-22 MED ORDER — ERYTHROMYCIN BASE 250 MG PO TABS
250.0000 mg | ORAL_TABLET | Freq: Three times a day (TID) | ORAL | Status: DC
  Administered 2019-07-22 – 2019-07-23 (×6): 250 mg via ORAL
  Filled 2019-07-22 (×8): qty 1

## 2019-07-22 NOTE — ED Provider Notes (Signed)
Emergency Department Provider Note  I have reviewed the triage vital signs and the nursing notes.  HISTORY  Chief Complaint Chest Pain and Emesis   HPI Deborah [name] is a 54 y.o. female with multiple medical problems as documented below who presents the emergency department today secondary to vomiting.  Patient states that she had acute onset of emesis around 1800 and then started having abdominal and chest pain since that time.  She states that it is uncontrollable.  There is a nursing note and states that she has some self-induced vomiting but patient adamantly denies it that was the case.  Patient states that she is not any diarrhea or fevers.  She states compliance with her medications up until this.  She did not try thing for symptoms prior to coming.  No suspicious food intake.  No sick contacts.  Review the records it does appear the patient is also here for DKA.   No other associated or modifying symptoms.    Past Medical History:  Diagnosis Date  . Acute back pain with sciatica, left   . Acute back pain with sciatica, right   . AKI (acute kidney injury) ([place])   . Anemia, unspecified   . Chronic pain   . Chronic systolic CHF (congestive heart failure) ([place])   . Diabetes mellitus   . Esophageal reflux   . Fibromyalgia   . Gastric ulcer   . Gastroparesis   . Gout   . Hyperlipidemia   . Hypertension   . Lumbosacral stenosis   . NICM (nonischemic cardiomyopathy) ([place])   . Obesity   . PAF (paroxysmal atrial fibrillation) ([place])   . Stroke ([place]) 02/2011  . Vitamin B12 deficiency anemia     Patient Active Problem List   Diagnosis Date Noted  . Lactic acidosis 07/10/2019  . Nonalcoholic fatty liver disease 06/05/2019  . Chronic diarrhea   . COPD exacerbation ([place]) 05/08/2019  . Acute on chronic HFrEF (heart failure with reduced ejection fraction) ([place])   . High risk social situation   . Restrictive lung disease secondary to obesity   . Physical  deconditioning   . [name] 05/07/2019  . History of gastric ulcer   . Uncontrolled type 2 diabetes mellitus with hyperglycemia ([place])   . Fibroid uterus 02/23/2019  . Congestion of nasal sinus 01/24/2019  . Chronic systolic CHF (congestive heart failure) ([place]) 12/19/2018  . Intractable nausea and vomiting 12/17/2018  . Hypoxia 12/17/2018  . Hyperglycemia 12/17/2018  . Elevated troponin I level   . Vomiting 07/18/2018  . Abdominal pain 07/17/2018  . Hyperkalemia 07/17/2018  . Cardiac arrest ([place]) 11/29/2017  . Pelvic mass 11/29/2017  . Leukocytosis 11/29/2017  . Anxiety 11/29/2017  . Allergic reaction to contrast media, initial encounter 11/28/2017  . Palliative care encounter   . Back pain 03/19/2017  . Stroke (cerebrum) ([place]) 03/19/2017  . GERD (gastroesophageal reflux disease) 03/19/2017  . Depression 03/19/2017  . Morbid obesity ([place])   . Urinary tract infection 08/16/2016  . Normocytic normochromic anemia 08/16/2016  . Gastroparesis 08/16/2016  . Intractable vomiting with nausea 06/17/2016  . Diabetic gastroparesis ([place]) 06/05/2016  . Gout 06/05/2016  . AKI (acute kidney injury) ([place]) 12/06/2015  . Chest pain 09/26/2015  . Hypokalemia 09/26/2015  . Hypomagnesemia 09/26/2015  . Nausea and vomiting 08/20/2015  . Gout flare 05/27/2015  . Lower abdominal pain 05/26/2015  . DKA (diabetic ketoacidoses) ([place]) 05/25/2015  . Uncontrolled type 2 diabetes mellitus with diabetic neuropathy, with long-term  current use of insulin ([place]) 05/25/2015  . Type 2 diabetes mellitus with hyperglycemia, with long-term current use of insulin ([place]) 05/25/2015  . CKD (chronic kidney disease), stage II 05/25/2015  . Essential hypertension, benign 09/28/2013    Past Surgical History:  Procedure Laterality Date  . CATARACT EXTRACTION  01/2014  . CHOLECYSTECTOMY      Current Outpatient Rx  . Order #: [id number] Class: Print  . Order #: [id number] Class: Normal  . Order #:  [id number] Class: Normal  . Order #: [id number] Class: Normal  . Order #: [id number] Class: Normal  . Order #: [id number] Class: Print  . Order #: [id number] Class: Normal  . Order #: [id number] Class: Normal  . Order #: [id number] Class: No Print  . Order #: [id number] Class: Print  . Order #: [id number] Class: Historical Med  . Order #: [id number] Class: Normal  . Order #: [id number] Class: Historical Med  . Order #: [id number] Class: Print  . Order #: [id number] Class: No Print  . Order #: [id number] Class: Normal  . Order #: [id number] Class: Print  . Order #: [id number] Class: Normal  . Order #: [id number] Class: No Print  . Order #: [id number] Class: Normal  . Order #: [id number] Class: Normal  . Order #: [id number] Class: Historical Med    Allergies Contrast media [iodinated diagnostic agents], Diazepam, Isovue [iopamidol], Lisinopril, Penicillins, Acetaminophen, Tolmetin, Aspirin, Nsaids, and Tramadol  Family History  Problem Relation Age of Onset  . Diabetes Mother   . Diabetes Father   . Heart disease Father   . Diabetes Sister   . Congestive Heart Failure Sister 45  . Diabetes Brother     Social History Social History   Tobacco Use  . Smoking status: Never Smoker  . Smokeless tobacco: Never Used  Substance Use Topics  . Alcohol use: No  . Drug use: No    Review of Systems  All other systems negative except as documented in the HPI. All pertinent positives and negatives as reviewed in the HPI. ____________________________________________  PHYSICAL EXAM:  VITAL SIGNS: ED Triage Vitals  Enc Vitals Group     BP 07/21/19 1956 (!) 147/116     Pulse Rate 07/21/19 1956 (!) 124     Resp 07/21/19 1956 (!) 28     Temp 07/21/19 1956 98.1 F (36.7 C)     Temp Source 07/21/19 1956 Oral     SpO2 07/21/19 1956 100 %    Constitutional: Alert and oriented. Well appearing and in no acute distress. Eyes: Conjunctivae are normal. PERRL. EOMI. Head: Atraumatic. Nose: No  congestion/rhinnorhea. Mouth/Throat: Mucous membranes are moist.  Oropharynx non-erythematous. Neck: No stridor.  No meningeal signs.   Cardiovascular: tachycardic rate, regular rhythm. Good peripheral circulation. Grossly normal heart sounds.   Respiratory: tachypneic respiratory effort.  No retractions. Lungs CTAB. Gastrointestinal: Soft and nontender. No distention.  Musculoskeletal: No lower extremity tenderness nor edema. No gross deformities of extremities. Neurologic:  Normal speech and language. No gross focal neurologic deficits are appreciated.  Skin:  Skin is warm, dry and intact. No rash noted.  ____________________________________________   LABS (all labs ordered are listed, but only abnormal results are displayed)  Labs Reviewed  CBC - Abnormal; Notable for the following components:      Result Value   WBC 17.6 (*)    RBC 3.42 (*)    Hemoglobin 9.2 (*)    HCT 30.0 (*)    Platelets 560 (*)    All other components within normal limits  COMPREHENSIVE METABOLIC PANEL - Abnormal; Notable for the following components:   Sodium 133 (*)  Chloride 92 (*)    CO2 21 (*)    Glucose, Bld 411 (*)    BUN 25 (*)    Creatinine, Ser 1.65 (*)    GFR calc non Af Amer 35 (*)    GFR calc Af Amer 40 (*)    Anion gap 20 (*)    All other components within normal limits  URINALYSIS, ROUTINE W REFLEX MICROSCOPIC - Abnormal; Notable for the following components:   Glucose, UA >=500 (*)    Hgb urine dipstick SMALL (*)    Ketones, ur 20 (*)    Protein, ur >=300 (*)    All other components within normal limits  BLOOD GAS, VENOUS - Abnormal; Notable for the following components:   pH, Ven 7.567 (*)    pCO2, Ven 25.0 (*)    All other components within normal limits  LACTIC ACID, PLASMA - Abnormal; Notable for the following components:   Lactic Acid, Venous 4.1 (*)    All other components within normal limits  LACTIC ACID, PLASMA - Abnormal; Notable for the following components:    Lactic Acid, Venous 5.5 (*)    All other components within normal limits  TROPONIN I (HIGH SENSITIVITY) - Abnormal; Notable for the following components:   Troponin I (High Sensitivity) 27 (*)    All other components within normal limits  URINE CULTURE  LIPASE, BLOOD  I-STAT BETA HCG BLOOD, ED ([facility], [facility], [facility] ONLY)  TROPONIN I (HIGH SENSITIVITY)   ____________________________________________  EKG   EKG Interpretation  Date/Time:  Saturday Jul 21 2019 19:56:45 EDT Ventricular Rate:  130 PR Interval:    QRS Duration: 93 QT Interval:  321 QTC Calculation: 472 R Axis:   -144 Text Interpretation: Right and left arm electrode reversal, interpretation assumes no reversal Sinus tachycardia Paired ventricular premature complexes Probable lateral infarct, age indeterminate Anteroseptal infarct, age indeterminate Baseline wander in lead(s) I II aVR V2 12 Lead; [facility]-Likar will need repeat Confirmed by Merrily Pew [phone]) on 07/21/2019 11:05:35 PM      ____________________________________________  RADIOLOGY  DG Chest 2 View  Result Date: 07/21/2019 CLINICAL DATA:  Chest pain EXAM: CHEST - 2 VIEW COMPARISON:  07/02/2019 FINDINGS: Mild cardiomegaly. No focal opacity or pleural effusion. No pneumothorax. IMPRESSION: Mild cardiomegaly. No edema or infiltrate. Electronically Signed   By: Donavan Foil M.D.   On: 07/21/2019 20:19   ____________________________________________  PROCEDURES  Procedure(s) performed:   .Critical Care Performed by: Merrily Pew, MD Authorized by: Merrily Pew, MD   Critical care provider statement:    Critical care time (minutes):  45   Critical care was necessary to treat or prevent imminent or life-threatening deterioration of the following conditions:  Endocrine crisis and dehydration   Critical care was time spent personally by me on the following activities:  Discussions with consultants, evaluation of patient's response to treatment, examination of  patient, ordering and performing treatments and interventions, ordering and review of laboratory studies, ordering and review of radiographic studies, pulse oximetry, re-evaluation of patient's condition, obtaining history from patient or surrogate and review of old charts   ____________________________________________  INITIAL IMPRESSION / [facility] / ED COURSE   This patient presents to the ED for concern of vomiting and abdominal pain, this involves an extensive number of treatment options, and is a complaint that carries with it a high risk of complications and morbidity.  The differential diagnosis includes gastroenteritis, cholecystitis, pancreatitis, DKA, bowel obstruction.     Lab Tests:   I  Ordered, reviewed, and interpreted labs, which included elevated lactic acid, hyperglycemia with a gap of 20 but likely explained by the lactic acid.  Venous blood gas shows respiratory alkalosis.  Urine with ketones but no evidence of infection.  Troponin similar to previous.  Mild AKI.  Medicines ordered:   I ordered medication multiple medications for pain and vomiting none of which seem to help very much with the pain.  Emesis seems to have improved.  Insulin for hyperglycemia and dehydration.  Imaging Studies ordered:   I independently visualized and interpreted imaging chest x-ray which showed no obvious infiltrates or evidence of bowel free air.  Additional history obtained:   Additional history obtained from no one  Previous records obtained and reviewed in epic  Consultations Obtained:   I consulted hospitalist, Dr. Marcello Moores, for admission and discussed lab and imaging findings  Reevaluation:  After the interventions stated above, I reevaluated the patient and found still tachycardic and tachypneic with improved emesis but still with some pain.  Lactic acid went from 5.5-4.1 with the fluids but she has a history of heart failure so do not want to give her too much  as she is already low but tachypneic.  Critical Interventions: Fluids Insulin Antiemetics Analgesics  ____________________________________________  FINAL CLINICAL IMPRESSION(S) / ED DIAGNOSES  Final diagnoses:  Dehydration  Hyperglycemia  Non-intractable vomiting with nausea, unspecified vomiting type    MEDICATIONS GIVEN DURING THIS VISIT:  Medications  ondansetron (ZOFRAN-ODT) disintegrating tablet 4 mg (4 mg Oral Given 07/21/19 2026)  lactated ringers bolus 1,000 mL (0 mLs Intravenous Stopped 07/22/19 0148)  droperidol (INAPSINE) 2.5 MG/ML injection 2.5 mg (2.5 mg Intramuscular Given 07/22/19 0004)  lactated ringers bolus 500 mL (0 mLs Intravenous Stopped 07/22/19 0231)  insulin aspart (novoLOG) injection 5 Units (5 Units Intravenous Given 07/22/19 0154)  lactated ringers bolus 1,000 mL (1,000 mLs Intravenous New Bag/Given 07/22/19 0353)  fentaNYL (SUBLIMAZE) injection 50 mcg (50 mcg Intravenous Given 07/22/19 0352)  ondansetron (ZOFRAN) injection 4 mg (4 mg Intravenous Given 07/22/19 0355)    NEW OUTPATIENT MEDICATIONS STARTED DURING THIS VISIT:  New Prescriptions   No medications on file    Note:  This note was prepared with assistance of Dragon voice recognition software. Occasional wrong-word or sound-a-like substitutions may have occurred due to the inherent limitations of voice recognition software.   [name], Corene Cornea, MD 07/22/19 [phone]

## 2019-07-22 NOTE — H&P (Addendum)
History and Physical    Deborah [name] GYB:[id number] DOB: 01-Jun-1965 DOA: 07/21/2019  PCP: [name], DO  Patient coming from: Home  I have personally briefly reviewed patient's old medical records in [place]  Chief Complaint: abdominal pain n/v hours PTA  HPI: Deborah [name] is a 54 y.o. female with medical history significant of  chronic systolic CHF (EF 40 to 87% on echo done 01/2019), paroxysmal A. fib on Eliquis, insulin-dependent type 2 diabetes with history of recurrent admissions for DKA , CKD stage III, hypertension, hyperlipidemia, CVA, gout, diabetic gastroparesis intolerant to Reglan, gastric ulcer, GERD, anemia of chronic disease, chronic pain, depression, morbid obesity (BMI 54.07), who has recent admission for exacerbation of gastroparesis 07/10/19- 07/19/19)presenting to the ED  with complaints of nausea and vomiting and diffuse abdominal pain. Patient states symptoms started around 6pm the evening prior to presentation. She notes no new foods, no sick contacts, no fever no chills, noted chest pain began s/p episode of n/v. Currently her main complaint is that of diffuse abdominal pain and nausea and vomiting. She noted no dysuria, no shortness of breath but generally feels unwell. She notes no exacerbation or alleviating factors regarding her symptoms. She also states that since discharge she has been complaint with all her medications.     ED Course: Afeb, hr 124, bp 147/116 sat 100%  ekg :poor quality , noted sinus tachycardia  Wbc 17.6,hgb 9.2 up from prior of 7.3,plt 560 Cxr:nad ce :27 Na 133,cr 1.65 up form 1.2,bicard 21, AG 20 GO:TLXBWIO + Vbg; ph 7.56/pco2 25 Lactic acid 5.5   Review of Systems: As per HPI otherwise 10 point review of systems negative.   Past Medical History:  Diagnosis Date  . Acute back pain with sciatica, left   . Acute back pain with sciatica, right   . AKI (acute kidney injury) ([place])   . Anemia, unspecified   .  Chronic pain   . Chronic systolic CHF (congestive heart failure) ([place])   . Diabetes mellitus   . Esophageal reflux   . Fibromyalgia   . Gastric ulcer   . Gastroparesis   . Gout   . Hyperlipidemia   . Hypertension   . Lumbosacral stenosis   . NICM (nonischemic cardiomyopathy) ([place])   . Obesity   . PAF (paroxysmal atrial fibrillation) ([place])   . Stroke ([place]) 02/2011  . Vitamin B12 deficiency anemia     Past Surgical History:  Procedure Laterality Date  . CATARACT EXTRACTION  01/2014  . CHOLECYSTECTOMY       reports that she has never smoked. She has never used smokeless tobacco. She reports that she does not drink alcohol or use drugs.  Allergies  Allergen Reactions  . Contrast Media [Iodinated Diagnostic Agents] Anaphylaxis    Cardiac arrest  . Diazepam [name]  . Isovue [Iopamidol] Anaphylaxis    Patient had seizure like activity and then code post 100 cc of isovue 300  . Lisinopril Anaphylaxis    Tongue and mouth swelling  . Penicillins Palpitations    Has patient had a PCN reaction causing immediate rash, facial/tongue/throat swelling, SOB or lightheadedness with hypotension: Yes, heart races Has patient had a PCN reaction causing severe rash involving mucus membranes or skin necrosis: No Has patient had a PCN reaction that required hospitalization: Yes  Has patient had a PCN reaction occurring within the last 10 years: No   . Acetaminophen Nausea Only and Other (See Comments)    Irritates stomach  ulcer  Abdominal pain  . Tolmetin Nausea Only and Other (See Comments)    ULCER  . Aspirin Other (See Comments)    Irritates stomach ulcer   . Nsaids Other (See Comments)    ULCER  . Tramadol Nausea And Vomiting    Family History  Problem Relation Age of Onset  . Diabetes Mother   . Diabetes Father   . Heart disease Father   . Diabetes Sister   . Congestive Heart Failure Sister 75  . Diabetes Brother    Prior to Admission medications   Medication  Sig Start Date End Date Taking? Authorizing Provider  ALPRAZolam Duanne Moron) 1 MG tablet Take 1 tablet (1 mg total) by mouth 3 (three) times daily as needed for up to 3 days for anxiety. 07/19/19 07/22/19 Yes Gherghe, Vella Redhead, MD  atorvastatin (LIPITOR) 10 MG tablet Take 1 tablet (10 mg total) by mouth daily. 12/13/18  Yes Lockamy, Christia Reading, DO  cetirizine (ZYRTEC) 10 MG tablet Take 1 tablet (10 mg total) by mouth daily. 12/13/18  Yes Lockamy, Timothy, DO  DULoxetine HCl 40 MG CPEP TAKE ONE CAPSULE BY MOUTH TWICE DAILY Patient taking differently: Take 40 mg by mouth in the morning and at bedtime.  06/04/19  Yes Lockamy, Timothy, DO  ELIQUIS 5 MG TABS tablet TAKE 1 TABLET (5 MG TOTAL) BY MOUTH 2 (TWO) TIMES DAILY. Patient taking differently: Take 5 mg by mouth 2 (two) times daily.  07/19/19  Yes Lockamy, Timothy, DO  erythromycin (E-MYCIN) 250 MG tablet Take 1 tablet (250 mg total) by mouth 4 (four) times daily -  with meals and at bedtime for 7 days. 07/19/19 07/26/19 Yes Gherghe, Vella Redhead, MD  fluticasone (FLONASE) 50 MCG/ACT nasal spray Place 2 sprays into both nostrils daily as needed for allergies or rhinitis. 12/19/18  Yes Rai, Ripudeep K, MD  hydrALAZINE (APRESOLINE) 25 MG tablet Take 1 tablet (25 mg total) by mouth 3 (three) times daily. For blood pressure 07/19/19 10/17/19 Yes Lockamy, Timothy, DO  insulin aspart (NOVOLOG FLEXPEN) 100 UNIT/ML FlexPen Inject 15 Units into the skin 3 (three) times daily with meals. Patient taking differently: Inject 30 Units into the skin 3 (three) times daily with meals.  06/26/19  Yes Mariel Aloe, MD  isosorbide mononitrate (IMDUR) 60 MG 24 hr tablet Take 1 tablet (60 mg total) by mouth daily. 07/05/19  Yes Eugenie Filler, MD  loperamide (IMODIUM) 2 MG capsule Take 2 mg by mouth 4 (four) times daily as needed for diarrhea or loose stools.   Yes [provider]  magnesium oxide (MAG-OX) 400 (241.3 Mg) MG tablet Take 1 tablet (400 mg total) by mouth 2 (two)  times daily. 06/14/19  Yes Pokhrel, Laxman, MD  metoprolol succinate (TOPROL-XL) 25 MG 24 hr tablet Take 25 mg by mouth daily. 06/01/19  Yes [provider]  pantoprazole (PROTONIX) 40 MG tablet Take 1 tablet (40 mg total) by mouth 2 (two) times daily. 07/04/19 08/03/19 Yes Eugenie Filler, MD  torsemide (DEMADEX) 20 MG tablet Take 2 tablets (40 mg total) by mouth daily. Hold if having significant vomiting 07/06/19  Yes Eugenie Filler, MD  albuterol (PROVENTIL) (2.5 MG/3ML) 0.083% nebulizer solution Take 3 mLs (2.5 mg total) by nebulization every 6 (six) hours as needed for wheezing or shortness of breath. 04/06/19   Scot Jun, FNP  blood glucose meter kit and supplies KIT Dispense based on patient and insurance preference. Use up to four times daily as directed. (FOR  ICD-9 250.00, 250.01). 12/13/18   [name], DO  dicyclomine (BENTYL) 20 MG tablet TAKE 1 TABLET (20 MG TOTAL) BY MOUTH 3 (THREE) TIMES DAILY AS NEEDED FOR SPASMS (ABDOMINAL CRAMPING). Patient not taking: Reported on 06/29/2019 06/22/19 07/22/19  [name], DO  LANTUS SOLOSTAR 100 UNIT/ML Solostar Pen Inject 45 Units into the skin at bedtime. 06/14/19   Pokhrel, Corrie Mckusick, MD  Melatonin 3 MG TABS Take 2 tablets (6 mg total) by mouth at bedtime. 12/13/18   [name], DO  nitroGLYCERIN (NITROSTAT) 0.4 MG SL tablet Place 1 tablet (0.4 mg total) under the tongue every 5 (five) minutes as needed for chest pain. 12/13/18   Lockamy, Christia Reading, DO  ondansetron (ZOFRAN-ODT) 4 MG disintegrating tablet Take 4 mg by mouth every 8 (eight) hours as needed for nausea or vomiting.  06/07/19   [provider]    Physical Exam: Vitals:   07/22/19 0357 07/22/19 0400 07/22/19 0515 07/22/19 0600  BP: (!) 141/73 (!) 141/65 (!) 128/114 (!) 173/110  Pulse: (!) 125 (!) 126 (!) 122   Resp: 16 16    Temp:      TempSrc:      SpO2: 94% 94% 96%     Constitutional: NAD, calm, comfortable Vitals:   07/22/19 0357 07/22/19  0400 07/22/19 0515 07/22/19 0600  BP: (!) 141/73 (!) 141/65 (!) 128/114 (!) 173/110  Pulse: (!) 125 (!) 126 (!) 122   Resp: 16 16    Temp:      TempSrc:      SpO2: 94% 94% 96%    Eyes: PERRL, lids and conjunctivae normal ENMT: Mucous membranes aredry. Posterior pharynx clear of any exudate or lesions.Normal dentition.  Neck: normal, supple, no masses, no thyromegaly Respiratory: clear to auscultation bilaterally, no wheezing, no crackles. Normal respiratory effort. No accessory muscle use.  Cardiovascular: tachycardic and rhythm, no murmurs / rubs / gallops. No extremity edema. 2+ pedal pulses. No carotid bruits.  Abdomen: diffuse tenderness,however abdomen soft non distended without guarding, no masses palpated. No hepatosplenomegaly. Bowel sounds positive.  Musculoskeletal: no clubbing / cyanosis. No joint deformity upper and lower extremities. Good ROM, no contractures. Normal muscle tone.  Skin: no rashes, lesions, ulcers. No induration Neurologic: CN 2-12 grossly intact. Sensation intact, DTR normal. Strength 5/5 in all 4.  Psychiatric: Normal judgment and insight. Alert and oriented x 3. In distress due to pain  Labs on Admission: I have personally reviewed following labs and imaging studies  CBC: Recent Labs  Lab 07/16/19 0402 07/17/19 0505 07/18/19 0419 07/19/19 0316 07/21/19 2005  WBC 10.8* 10.0 10.5 9.3 17.6*  NEUTROABS 7.1 6.5 6.3  --   --   HGB 8.0* 7.6* 7.2* 7.3* 9.2*  HCT 26.1* 25.1* 24.3* 24.4* 30.0*  MCV 88.2 87.8 88.7 89.4 87.7  PLT 466* 425* 432* 418* 333*   Basic Metabolic Panel: Recent Labs  Lab 07/16/19 0402 07/17/19 0505 07/18/19 0419 07/19/19 0316 07/21/19 2335  NA 138 136 136 138 133*  K 3.2* 3.1* 3.4* 3.7 3.8  CL 99 98 101 103 92*  CO2 '28 26 26 27 ' 21*  GLUCOSE 182* 273* 210* 166* 411*  BUN 15 20 24* 24* 25*  CREATININE 1.28* 1.94* 1.45* 1.26* 1.65*  CALCIUM 9.0 8.4* 8.1* 8.6* 8.9  MG 1.4* 2.1 1.9  --   --   PHOS 3.2 4.2 3.3  --   --     GFR: Estimated Creatinine Clearance: 58.4 mL/min (A) (by C-G formula based on SCr of 1.65 mg/dL (  H)). Liver Function Tests: Recent Labs  Lab 07/16/19 0402 07/17/19 0505 07/18/19 0419 07/21/19 2335  AST 9* 12* 10* 21  ALT '9 11 9 13  ' ALKPHOS 81 94 89 101  BILITOT 0.4 0.4 0.6 1.0  PROT 6.7 6.5 6.1* 7.8  ALBUMIN 2.9* 2.9* 2.7* 3.6   Recent Labs  Lab 07/21/19 2335  LIPASE 23   No results for input(s): AMMONIA in the last 168 hours. Coagulation Profile: No results for input(s): INR, PROTIME in the last 168 hours. Cardiac Enzymes: No results for input(s): CKTOTAL, CKMB, CKMBINDEX, TROPONINI in the last 168 hours. BNP (last 3 results) No results for input(s): PROBNP in the last 8760 hours. HbA1C: No results for input(s): HGBA1C in the last 72 hours. CBG: Recent Labs  Lab 07/18/19 1142 07/18/19 1642 07/18/19 2100 07/19/19 0744 07/19/19 1124  GLUCAP 191* 245* 331* 196* 268*   Lipid Profile: No results for input(s): CHOL, HDL, LDLCALC, TRIG, CHOLHDL, LDLDIRECT in the last 72 hours. Thyroid Function Tests: No results for input(s): TSH, T4TOTAL, FREET4, T3FREE, THYROIDAB in the last 72 hours. Anemia Panel: No results for input(s): VITAMINB12, FOLATE, FERRITIN, TIBC, IRON, RETICCTPCT in the last 72 hours. Urine analysis:    Component Value Date/Time   COLORURINE YELLOW 07/21/2019 2335   APPEARANCEUR CLEAR 07/21/2019 2335   LABSPEC 1.012 07/21/2019 2335   PHURINE 6.0 07/21/2019 2335   GLUCOSEU >=500 (A) 07/21/2019 2335   HGBUR SMALL (A) 07/21/2019 2335   BILIRUBINUR NEGATIVE 07/21/2019 2335   KETONESUR 20 (A) 07/21/2019 2335   PROTEINUR >=300 (A) 07/21/2019 2335   UROBILINOGEN 0.2 10/02/2013 2108   NITRITE NEGATIVE 07/21/2019 2335   LEUKOCYTESUR NEGATIVE 07/21/2019 2335    Radiological Exams on Admission: DG Chest 2 View  Result Date: 07/21/2019 CLINICAL DATA:  Chest pain EXAM: CHEST - 2 VIEW COMPARISON:  07/02/2019 FINDINGS: Mild cardiomegaly. No focal opacity  or pleural effusion. No pneumothorax. IMPRESSION: Mild cardiomegaly. No edema or infiltrate. Electronically Signed   By: Donavan Foil M.D.   On: 07/21/2019 20:19    EKG: Independently reviewed. See above   Assessment/Plan Abdominal pain , most likely due to intractable nausea vomiting due to exacerbation of gastroparesis, however will need to rule out other pathology  -will get ct abdomen for further evaluation / as well as rule out possible infection -due to increase lactate ,wbc/ sirs like picture will start on broad spectrum abx - to be de-escalated as able.   -of note patient has had multiple admissions for the same and had difficulty tolerating varied antiemetics  -last admission she was placed on erythromycin and responded well  -will resume erythromcyin -consider gi consult for further assistance ? Need for gastric pacer  If gastroparesis exacerbation is found to be the final cause of patient presentation  Lactic acidosis  - possible due to underlying infection vs dehydration/volume status  - cxr negative /ua w/o signs of infection  -ct abdomen pending -continue ivfs, note with ivfs lactate trending down  -broad spectrum abx for now  AKI -due to low volume status  - continue with ivfs  -monitor urine output  -hold nephrotoxic medications   Atypical Chest pain -associated with bouts of emesis -ekg poor quality repeat pending -mild increase in CE, note thought to be related to acs at time point  -will continue to cycle ce   DMII -uncontrolled/ but not in dka -urine ketones thought to be related to starvation ketosis  - vbg noted alkalosis and bicarb 21 on bmp  -will  place on insulin house protocol  -continue to monitor bmp and AG  q 6hours  And just treatment as needed    Paroxysmal A. Fib cont on Eliquis  Hypertension -uncontrolled  -place on prn iv  -resume oral once patient able to tolerate  Hyperlipidemia -holding med for now    CVA -on secondary  prophylaxis   Gout: No acute issues   DVT prophylaxis:on Eliquis Code Status: Full Family Communication: n/a  Disposition Plan: 3-5 days  Consults called:consider gi  Admission status: inpatient    Clance Boll MD [facility]  If 7PM-7AM, please contact night-coverage [email or web address] Password [facility]  07/22/2019, 6:45 AM

## 2019-07-22 NOTE — Progress Notes (Signed)
Inpatient Diabetes Program Recommendations  AACE/ADA: New Consensus Statement on Inpatient Glycemic Control   Target Ranges:  Prepandial:   less than 140 mg/dL      Peak postprandial:   less than 180 mg/dL (1-2 hours)      Critically ill patients:  140 - 180 mg/dL  Results for SHELSEA, HANGARTNER (MRN [id number]) as of 07/22/2019 13:19  Ref. Range 07/22/2019 08:29 07/22/2019 12:20  Glucose-Capillary Latest Ref Range: 70 - 99 mg/dL 424 (H)  Novolog 20 units @ 8:43 305 (H)  Novolog 30 units at 12:40   Results for URIAH, TRUEBA (MRN [id number]) as of 07/22/2019 13:19  Ref. Range 07/21/2019 23:35  Glucose Latest Ref Range: 70 - 99 mg/dL 411 (H)   Review of Glycemic Control  Diabetes history: DM2 Outpatient Diabetes medications: Lantus 45 units QHS, Novolog 30 units TID with meals Current orders for Inpatient glycemic control: IV insulin  Inpatient Diabetes Program Recommendations:    Insulin: Once MD is ready to transition patient to SQ insulin regimen, would recommend ordering Lantus 35 units Q24H (to start now), CBGs Q4H, and Novolog 0-15 units Q4H.  NOTE: Noted consult for Diabetes Coordinator. Diabetes Coordinator is not on campus over the weekend but available by pager from 8am to 5pm for questions or concerns. Chart reviewed. Patient well known to inpatient team and diabetes coordinator last spoke with patient on 06/13/19.  Patient was last inpatient 07/10/19-07/19/19. Per chart, patient in ED currently with abdominal pain, nausea, vomiting, lactic acidosis, AKI, chest pain, and hyperglycemia. Initial glucose 411 mg/dl and current glucose 305 mg/dl at 12:20 and patient received Novolog 30 units at 12:40. IV insulin was just ordered. MD may want to consider using SQ insulin instead of IV insulin at this time. IF so, would recommend Lantus 35 units Q24H, CBGs Q4H, and Novolog 0-15 units Q4H.  Thanks, Barnie Alderman, RN, MSN, CDE Diabetes Coordinator Inpatient Diabetes  Program [phone] (Team Pager from 8am to 5pm)

## 2019-07-22 NOTE — Progress Notes (Addendum)
PROGRESS NOTE    Deborah [name]  WGN:[id number] DOB: Jul 10, 1965 DOA: 07/21/2019 PCP: [name], DO     Brief Narrative:  54 y.o. BF PMHx Depression, CVA, chronic Systolic CHF (EF 40 to 78% on echo done 01/2019), paroxysmal A. fib on Eliquis, DM type II uncontrolled with complication, with history of recurrent admissions for DKA, Diabetic Gastroparesis intolerant to Reglan,, CKD stage III, HTN, Gout,  gastric ulcer, GERD, anemia of chronic disease, Chronic Pain syndrome,  morbid obesity (BMI 54.07), who has recent admission for exacerbation of gastroparesis 07/10/19- 07/19/19)  Presenting to the ED  with complaints of nausea and vomiting and diffuse abdominal pain. Patient states symptoms started around 6pm the evening prior to presentation. She notes no new foods, no sick contacts, no fever no chills, noted chest pain began s/p episode of n/v. Currently her main complaint is that of diffuse abdominal pain and nausea and vomiting. She noted no dysuria, no shortness of breath but generally feels unwell. She notes no exacerbation or alleviating factors regarding her symptoms. She also states that since discharge she has been complaint with all her medications.    Subjective: A/O x4, negative CP, negative S OB.  Positive abdominal pain.  EXTREMELY POOR understanding of her multiple medical problems   Assessment & Plan:   Active Problems:   Essential hypertension, benign   Uncontrolled type 2 diabetes mellitus with diabetic neuropathy, with long-term current use of insulin (HCC)   CKD (chronic kidney disease), stage II   Diabetic gastroparesis (HCC)   Morbid obesity (HCC)   Anxiety   Intractable nausea and vomiting   Chronic systolic CHF (congestive heart failure) ([place])   Uncontrolled type 2 diabetes mellitus with hyperglycemia ([place])   Hyperosmolar hyperglycemic state (HHS) ([place])   Drug-seeking behavior  Sepsis -Admission met criteria for sepsis HR> 90, RR>20, lactic  acidosis -Doubt patient truly septic most likely secondary to HHNS however given patient's multiple medical conditions will continue antibiotics until cultures result.  Abdominal pain/gastroparesis -Most likely secondary to patient's uncontrolled diabetes. -Multiple admissions for the same complaint. -Erythromycin 250 mg TID some: Well on last admission -Thorazine IV 25 mg x 1  -Per mother patient was diagnosed with DM gastroparesis at [facility] however took no action concerning this condition.  5/17 will consult GI for a minimal of an outpatient follow-up.  Doubt patient would be a candidate for any sort of gastric pacing given her noncompliance   Chronic systolic CHF -Strict in and out -Daily weight -Patient has not been to see a cardiologist nor has she taken any initiative to take care of herself from a cardiac standpoint since she was diagnosed with CHF. -Obtain echocardiogram -5/17 we will consult cardiology to talk patient into system  Essential HTN -Metoprolol IV 5 mg PRN -Toprol 25 mg daily  -Hydralazine 5 mg QID -5/16 repeat EKG once sinus tachycardia slowed down  Paroxysmal A. Fib -Cont on Eliquis -Currently rate controlled  Atypical Chest pain -associated with bouts of emesis -ekg poor quality repeat pending -mild increase in CE, note thought to be related to acs at time point  -will continue to cycle ce   Lactic Acidosis -Most likely secondary to HHNS cultures pending -CT abdomen pelvis; nondiagnostic see results below -Continue IV fluids but monitor carefully for fluid overload  Acute on CKD stage IIIa (baseline Cr 1.27) -Hold on nephrotoxic medication -Trend creatinine  DM type II uncontrolled with complication/HHNS -4/69 hemoglobin A1c= 8.4 -5/16 Lantus 45 units daily (hold) -NovoLog 30 units  qac (hold) -Resistant SSI (hold) -5/16 Endo tool   GI pain/GERD -GI cocktail  Hyperlipidemia -holding med for now    CVA -on secondary  prophylaxis   Gout: No acute issues   Morbidly obese   Pain seeking behavior -Has consistently asked for Dilaudid.   DVT prophylaxis: Eliquis Code Status: Full Family Communication: 5/16 mother at bedside discussed plan of care answered all questions Disposition Plan:  Status is: Inpatient    Dispo: The patient is from: Home              Anticipated d/c is to: Cardiac rehab              Anticipated d/c date is: 26 May              Patient currently unstable      Consultants:     Procedures/Significant Events:  01/26/2019 Echocardiogram;Left Ventricle: Left ventricular ejection fraction, by visual estimation,  is 40 to 45%. The left ventricle has moderately decreased function. The  left ventricle demonstrates global hypokinesis. There is no left  ventricular hypertrophy.   Right Ventricle: The right ventricular size is normal. No increase in  right ventricular wall thickness. Global RV systolic function is has  normal systolic function.   Left Atrium: Left atrial size was normal in size.   Right Atrium: Right atrial size was normal in size   Pericardium: A small pericardial effusion is present.   Mitral Valve: The mitral valve is normal in structure. Trace mitral valve  regurgitation.   Tricuspid Valve: The tricuspid valve is normal in structure. Tricuspid  valve regurgitation is trivial.   Aortic Valve: The aortic valve is tricuspid. Aortic valve regurgitation is  not visualized. Mild aortic valve sclerosis is present, with no evidence  of aortic valve stenosis.   Pulmonic Valve: The pulmonic valve was grossly normal. Pulmonic valve  regurgitation is not visualized.   Aorta: The aortic root, ascending aorta and aortic arch are all  structurally normal, with no evidence of dilitation or obstruction.   Pulmonary Artery: The pulmonary artery is not well seen.   Venous: The inferior vena cava is dilated in size with greater than 50%  respiratory  variability, suggesting right atrial pressure of 8 mmHg.   IAS/Shunts: No atrial level shunt detected by color flow Doppler.  --------------------------------------------------------------------------------------------------------------------------------------  5/16 CT abdomen pelvis W0 contrast;No acute findings within the abdomen or pelvis. No bowel obstruction or evidence of bowel wall inflammation. No evidence of acute solid organ abnormality. No renal or ureteral calculi. Appendix is normal. 2. Uterine fibroid measures 5 cm. 3. Degenerative spondylosis of the lower lumbar spine, mild to moderate in degree.  I have personally reviewed and interpreted all radiology studies and my findings are as above.  VENTILATOR SETTINGS:    Cultures   Antimicrobials: Anti-infectives (From admission, onward)   Start     Dose/Rate Route Frequency Ordered Stop   07/22/19 0800  erythromycin (E-MYCIN) tablet 250 mg     250 mg Oral 3 times daily with meals & bedtime 07/22/19 0723     07/22/19 0800  ciprofloxacin (CIPRO) IVPB 400 mg     400 mg 200 mL/hr over 60 Minutes Intravenous Every 12 hours 07/22/19 0734     07/22/19 0800  metroNIDAZOLE (FLAGYL) IVPB 500 mg     500 mg 100 mL/hr over 60 Minutes Intravenous Every 8 hours 07/22/19 0734         Devices    LINES / TUBES:  Continuous Infusions: . sodium chloride Stopped (07/22/19 1325)  . ciprofloxacin Stopped (07/22/19 1037)  . dextrose 5 % and 0.45% NaCl 75 mL/hr at 07/22/19 1433  . insulin 4.2 mL/hr at 07/22/19 1521  . metronidazole Stopped (07/22/19 1037)  . potassium chloride 10 mEq (07/22/19 1535)     Objective: Vitals:   07/22/19 1520 07/22/19 1555 07/22/19 1600 07/22/19 1610  BP: (!) 90/59 (!) 90/55 (!) 101/58 97/64  Pulse: (!) 101 99 97 99  Resp: (!) 26   12  Temp:      TempSrc:      SpO2: 94% 100% 100% 100%    Intake/Output Summary (Last 24 hours) at 07/22/2019 1632 Last data filed at 07/22/2019 1524 Gross  per 24 hour  Intake 2927.13 ml  Output 1200 ml  Net 1727.13 ml   There were no vitals filed for this visit.  Examination:  General: A/O x4, positive no acute respiratory distress Eyes: negative scleral hemorrhage, negative anisocoria, negative icterus ENT: Negative Runny nose, negative gingival bleeding, Neck:  Negative scars, masses, torticollis, lymphadenopathy, JVD Lungs: Clear to auscultation bilaterally without wheezes or crackles Cardiovascular: Regular rate and rhythm without murmur gallop or rub normal S1 and S2 Abdomen: MORBIDLY OBESE, positive bdominal pain, nondistended, positive soft, bowel sounds, no rebound, no ascites, no appreciable mass Extremities: No significant cyanosis, clubbing, or edema bilateral lower extremities Skin: Negative rashes, lesions, ulcers Psychiatric:  Negative depression, negative anxiety, negative fatigue, negative mania extremely POOR understanding of multiple medical problems. Central nervous system:  Cranial nerves II through XII intact, tongue/uvula midline, all extremities muscle strength 5/5, sensation intact throughout, negative dysarthria, negative expressive aphasia, negative receptive aphasia.  .     Data Reviewed: Care during the described time interval was provided by me .  I have reviewed this patient's available data, including medical history, events of note, physical examination, and all test results as part of my evaluation.  CBC: Recent Labs  Lab 07/16/19 0402 07/17/19 0505 07/18/19 0419 07/19/19 0316 07/21/19 2005  WBC 10.8* 10.0 10.5 9.3 17.6*  NEUTROABS 7.1 6.5 6.3  --   --   HGB 8.0* 7.6* 7.2* 7.3* 9.2*  HCT 26.1* 25.1* 24.3* 24.4* 30.0*  MCV 88.2 87.8 88.7 89.4 87.7  PLT 466* 425* 432* 418* 333*   Basic Metabolic Panel: Recent Labs  Lab 07/16/19 0402 07/17/19 0505 07/18/19 0419 07/19/19 0316 07/21/19 2335  NA 138 136 136 138 133*  K 3.2* 3.1* 3.4* 3.7 3.8  CL 99 98 101 103 92*  CO2 '28 26 26 27 ' 21*   GLUCOSE 182* 273* 210* 166* 411*  BUN 15 20 24* 24* 25*  CREATININE 1.28* 1.94* 1.45* 1.26* 1.65*  CALCIUM 9.0 8.4* 8.1* 8.6* 8.9  MG 1.4* 2.1 1.9  --   --   PHOS 3.2 4.2 3.3  --   --    GFR: Estimated Creatinine Clearance: 58.4 mL/min (A) (by C-G formula based on SCr of 1.65 mg/dL (H)). Liver Function Tests: Recent Labs  Lab 07/16/19 0402 07/17/19 0505 07/18/19 0419 07/21/19 2335  AST 9* 12* 10* 21  ALT '9 11 9 13  ' ALKPHOS 81 94 89 101  BILITOT 0.4 0.4 0.6 1.0  PROT 6.7 6.5 6.1* 7.8  ALBUMIN 2.9* 2.9* 2.7* 3.6   Recent Labs  Lab 07/21/19 2335  LIPASE 23   No results for input(s): AMMONIA in the last 168 hours. Coagulation Profile: No results for input(s): INR, PROTIME in the last 168 hours. Cardiac Enzymes: No results for  input(s): CKTOTAL, CKMB, CKMBINDEX, TROPONINI in the last 168 hours. BNP (last 3 results) No results for input(s): PROBNP in the last 8760 hours. HbA1C: No results for input(s): HGBA1C in the last 72 hours. CBG: Recent Labs  Lab 07/19/19 1124 07/22/19 0829 07/22/19 1220 07/22/19 1423 07/22/19 1514  GLUCAP 268* 424* 305* 226* 181*   Lipid Profile: No results for input(s): CHOL, HDL, LDLCALC, TRIG, CHOLHDL, LDLDIRECT in the last 72 hours. Thyroid Function Tests: No results for input(s): TSH, T4TOTAL, FREET4, T3FREE, THYROIDAB in the last 72 hours. Anemia Panel: No results for input(s): VITAMINB12, FOLATE, FERRITIN, TIBC, IRON, RETICCTPCT in the last 72 hours. Sepsis Labs: Recent Labs  Lab 07/21/19 2357 07/22/19 0245  LATICACIDVEN 5.5* 4.1*    No results found for this or any previous visit (from the past 240 hour(s)).       Radiology Studies: CT ABDOMEN PELVIS WO CONTRAST  Result Date: 07/22/2019 CLINICAL DATA:  Diffuse abdominal pain, nausea and vomiting. EXAM: CT ABDOMEN AND PELVIS WITHOUT CONTRAST TECHNIQUE: Multidetector CT imaging of the abdomen and pelvis was performed following the standard protocol without IV contrast.  COMPARISON:  CT abdomen dated 07/10/2019. FINDINGS: Lower chest: No acute abnormality. Hepatobiliary: No focal liver abnormality is seen. Status post cholecystectomy. No biliary dilatation. Pancreas: Unremarkable. No pancreatic ductal dilatation or surrounding inflammatory changes. Spleen: Normal in size without focal abnormality. Adrenals/Urinary Tract: Adrenal glands appear normal. Kidneys are unremarkable without mass, stone or hydronephrosis. No ureteral or bladder calculi. Bladder appears normal. Stomach/Bowel: No dilated large or small bowel loops. No evidence of bowel wall inflammation. Appendix is normal. Stomach is unremarkable. Vascular/Lymphatic: No significant vascular findings are present. No enlarged abdominal or pelvic lymph nodes. Reproductive: Uterine fibroid measures 5 cm. No acute findings. Again noted is some degree of pelvic floor laxity. Other: No free fluid or abscess collection. No free intraperitoneal air. Musculoskeletal: Degenerative spondylosis of the lower lumbar spine, mild to moderate in degree. No acute or suspicious osseous finding. IMPRESSION: 1. No acute findings within the abdomen or pelvis. No bowel obstruction or evidence of bowel wall inflammation. No evidence of acute solid organ abnormality. No renal or ureteral calculi. Appendix is normal. 2. Uterine fibroid measures 5 cm. 3. Degenerative spondylosis of the lower lumbar spine, mild to moderate in degree. Electronically Signed   By: Franki Cabot M.D.   On: 07/22/2019 09:14   DG Chest 2 View  Result Date: 07/21/2019 CLINICAL DATA:  Chest pain EXAM: CHEST - 2 VIEW COMPARISON:  07/02/2019 FINDINGS: Mild cardiomegaly. No focal opacity or pleural effusion. No pneumothorax. IMPRESSION: Mild cardiomegaly. No edema or infiltrate. Electronically Signed   By: Donavan Foil M.D.   On: 07/21/2019 20:19        Scheduled Meds: . apixaban  5 mg Oral BID  . dicyclomine  10 mg Oral Once  . DULoxetine  40 mg Oral Daily  .  erythromycin  250 mg Oral TID WC & HS  . hydrALAZINE  5 mg Intravenous Q6H  . isosorbide mononitrate  60 mg Oral Daily  . metoprolol succinate  25 mg Oral Daily  . pantoprazole (PROTONIX) IV  40 mg Intravenous Q24H   Continuous Infusions: . sodium chloride Stopped (07/22/19 1325)  . ciprofloxacin Stopped (07/22/19 1037)  . dextrose 5 % and 0.45% NaCl 75 mL/hr at 07/22/19 1433  . insulin 4.2 mL/hr at 07/22/19 1521  . metronidazole Stopped (07/22/19 1037)  . potassium chloride 10 mEq (07/22/19 1535)     LOS: 0 days  Time spent:40 min    Raidyn Wassink, Geraldo Docker, MD [facility] Pager [phone]  If 7PM-7AM, please contact night-coverage [email or web address] Password [facility] 07/22/2019, 4:32 PM

## 2019-07-22 NOTE — ED Notes (Signed)
Date and time results received: 07/22/19 12:59 AM   Test: Lactic Acid  Critical Value: 5.5  Name of Provider Notified: Dr Dayna Barker

## 2019-07-23 ENCOUNTER — Inpatient Hospital Stay (HOSPITAL_COMMUNITY): Payer: Medicare Other

## 2019-07-23 ENCOUNTER — Encounter (HOSPITAL_COMMUNITY): Payer: Self-pay | Admitting: Internal Medicine

## 2019-07-23 DIAGNOSIS — I5022 Chronic systolic (congestive) heart failure: Secondary | ICD-10-CM

## 2019-07-23 LAB — COMPREHENSIVE METABOLIC PANEL
ALT: 11 U/L (ref 0–44)
AST: 11 U/L — ABNORMAL LOW (ref 15–41)
Albumin: 3.1 g/dL — ABNORMAL LOW (ref 3.5–5.0)
Alkaline Phosphatase: 81 U/L (ref 38–126)
Anion gap: 12 (ref 5–15)
BUN: 16 mg/dL (ref 6–20)
CO2: 22 mmol/L (ref 22–32)
Calcium: 8 mg/dL — ABNORMAL LOW (ref 8.9–10.3)
Chloride: 103 mmol/L (ref 98–111)
Creatinine, Ser: 1.54 mg/dL — ABNORMAL HIGH (ref 0.44–1.00)
GFR calc Af Amer: 44 mL/min — ABNORMAL LOW (ref >60)
GFR calc non Af Amer: 38 mL/min — ABNORMAL LOW (ref >60)
Glucose, Bld: 169 mg/dL — ABNORMAL HIGH (ref 70–99)
Potassium: 3.9 mmol/L (ref 3.5–5.1)
Sodium: 137 mmol/L (ref 135–145)
Total Bilirubin: 0.6 mg/dL (ref 0.3–1.2)
Total Protein: 6.6 g/dL (ref 6.5–8.1)

## 2019-07-23 LAB — GLUCOSE, CAPILLARY
Glucose-Capillary: 174 mg/dL — ABNORMAL HIGH (ref 70–99)
Glucose-Capillary: 155 mg/dL — ABNORMAL HIGH (ref 70–99)
Glucose-Capillary: 178 mg/dL — ABNORMAL HIGH (ref 70–99)
Glucose-Capillary: 185 mg/dL — ABNORMAL HIGH (ref 70–99)
Glucose-Capillary: 262 mg/dL — ABNORMAL HIGH (ref 70–99)
Glucose-Capillary: 252 mg/dL — ABNORMAL HIGH (ref 70–99)
Glucose-Capillary: 287 mg/dL — ABNORMAL HIGH (ref 70–99)
Glucose-Capillary: 219 mg/dL — ABNORMAL HIGH (ref 70–99)

## 2019-07-23 LAB — CBC
HCT: 27.5 % — ABNORMAL LOW (ref 36.0–46.0)
Hemoglobin: 8.5 g/dL — ABNORMAL LOW (ref 12.0–15.0)
MCH: 26.9 pg (ref 26.0–34.0)
MCHC: 30.9 g/dL (ref 30.0–36.0)
MCV: 87 fL (ref 80.0–100.0)
Platelets: 318 10*3/uL (ref 150–400)
RBC: 3.16 MIL/uL — ABNORMAL LOW (ref 3.87–5.11)
RDW: 15.4 % (ref 11.5–15.5)
WBC: 16.4 10*3/uL — ABNORMAL HIGH (ref 4.0–10.5)
nRBC: 0 % (ref 0.0–0.2)

## 2019-07-23 LAB — ECHOCARDIOGRAM COMPLETE
Height: 66 in
Weight: 5121.73 oz

## 2019-07-23 LAB — URINE CULTURE

## 2019-07-23 LAB — LACTIC ACID, PLASMA: Lactic Acid, Venous: 0.9 mmol/L (ref 0.5–1.9)

## 2019-07-23 LAB — MRSA PCR SCREENING: MRSA by PCR: NEGATIVE

## 2019-07-23 LAB — SARS CORONAVIRUS 2 BY RT PCR (HOSPITAL ORDER, PERFORMED IN ~~LOC~~ HOSPITAL LAB): SARS Coronavirus 2: NEGATIVE

## 2019-07-23 MED ORDER — MORPHINE SULFATE (PF) 2 MG/ML IV SOLN
0.5000 mg | INTRAVENOUS | Status: AC | PRN
  Administered 2019-07-23: 0.5 mg via INTRAVENOUS
  Filled 2019-07-23: qty 1

## 2019-07-23 MED ORDER — INSULIN GLARGINE 100 UNIT/ML ~~LOC~~ SOLN
35.0000 [IU] | Freq: Every day | SUBCUTANEOUS | Status: DC
  Administered 2019-07-23 – 2019-08-05 (×13): 35 [IU] via SUBCUTANEOUS
  Filled 2019-07-23 (×16): qty 0.35

## 2019-07-23 MED ORDER — PANTOPRAZOLE SODIUM 40 MG PO TBEC
40.0000 mg | DELAYED_RELEASE_TABLET | Freq: Every day | ORAL | Status: DC
  Administered 2019-07-24 – 2019-08-03 (×11): 40 mg via ORAL
  Filled 2019-07-23 (×11): qty 1

## 2019-07-23 MED ORDER — ONDANSETRON HCL 4 MG/2ML IJ SOLN
4.0000 mg | Freq: Four times a day (QID) | INTRAMUSCULAR | Status: DC | PRN
  Administered 2019-07-23 – 2019-08-08 (×13): 4 mg via INTRAVENOUS
  Filled 2019-07-23 (×15): qty 2

## 2019-07-23 MED ORDER — MORPHINE SULFATE (PF) 2 MG/ML IV SOLN: 1.0000 mg | Freq: Once | INTRAVENOUS | Status: DC

## 2019-07-23 MED ORDER — ONDANSETRON HCL 4 MG PO TABS
4.0000 mg | ORAL_TABLET | ORAL | Status: DC | PRN
  Administered 2019-07-23 – 2019-08-09 (×3): 4 mg via ORAL
  Filled 2019-07-23 (×3): qty 1

## 2019-07-23 MED ORDER — OXYCODONE HCL 5 MG PO TABS
5.0000 mg | ORAL_TABLET | Freq: Four times a day (QID) | ORAL | Status: DC | PRN
  Filled 2019-07-23: qty 1

## 2019-07-23 MED ORDER — INSULIN ASPART 100 UNIT/ML ~~LOC~~ SOLN
0.0000 [IU] | SUBCUTANEOUS | Status: DC
  Administered 2019-07-23: 5 [IU] via SUBCUTANEOUS
  Administered 2019-07-23: 8 [IU] via SUBCUTANEOUS
  Administered 2019-07-23: 3 [IU] via SUBCUTANEOUS
  Administered 2019-07-23 (×2): 8 [IU] via SUBCUTANEOUS
  Administered 2019-07-24: 3 [IU] via SUBCUTANEOUS
  Administered 2019-07-24: 15 [IU] via SUBCUTANEOUS
  Administered 2019-07-24: 11 [IU] via SUBCUTANEOUS
  Administered 2019-07-24 (×3): 3 [IU] via SUBCUTANEOUS
  Administered 2019-07-25: 8 [IU] via SUBCUTANEOUS
  Administered 2019-07-25: 15 [IU] via SUBCUTANEOUS
  Administered 2019-07-25: 8 [IU] via SUBCUTANEOUS
  Administered 2019-07-25: 5 [IU] via SUBCUTANEOUS
  Administered 2019-07-25 (×2): 2 [IU] via SUBCUTANEOUS
  Administered 2019-07-26: 11 [IU] via SUBCUTANEOUS
  Administered 2019-07-26: 8 [IU] via SUBCUTANEOUS
  Administered 2019-07-26 – 2019-07-27 (×5): 3 [IU] via SUBCUTANEOUS
  Administered 2019-07-27: 2 [IU] via SUBCUTANEOUS
  Administered 2019-07-27 – 2019-07-28 (×4): 3 [IU] via SUBCUTANEOUS
  Administered 2019-07-28: 5 [IU] via SUBCUTANEOUS
  Administered 2019-07-28: 3 [IU] via SUBCUTANEOUS
  Administered 2019-07-28: 11 [IU] via SUBCUTANEOUS
  Administered 2019-07-28: 5 [IU] via SUBCUTANEOUS
  Administered 2019-07-28: 3 [IU] via SUBCUTANEOUS
  Administered 2019-07-29: 5 [IU] via SUBCUTANEOUS
  Administered 2019-07-29: 2 [IU] via SUBCUTANEOUS
  Administered 2019-07-29: 8 [IU] via SUBCUTANEOUS
  Administered 2019-07-29: 3 [IU] via SUBCUTANEOUS
  Administered 2019-07-29: 5 [IU] via SUBCUTANEOUS
  Administered 2019-07-30: 3 [IU] via SUBCUTANEOUS
  Administered 2019-07-30: 2 [IU] via SUBCUTANEOUS
  Administered 2019-07-30: 11 [IU] via SUBCUTANEOUS
  Administered 2019-07-30: 8 [IU] via SUBCUTANEOUS
  Administered 2019-07-31: 3 [IU] via SUBCUTANEOUS
  Administered 2019-07-31: 5 [IU] via SUBCUTANEOUS
  Administered 2019-07-31 (×2): 11 [IU] via SUBCUTANEOUS
  Administered 2019-07-31: 3 [IU] via SUBCUTANEOUS

## 2019-07-23 MED ORDER — METOCLOPRAMIDE HCL 5 MG/ML IJ SOLN
10.0000 mg | Freq: Three times a day (TID) | INTRAMUSCULAR | Status: DC
  Administered 2019-07-23: 10 mg via INTRAVENOUS
  Filled 2019-07-23: qty 2

## 2019-07-23 MED ORDER — ALUM & MAG HYDROXIDE-SIMETH 200-200-20 MG/5ML PO SUSP
30.0000 mL | Freq: Four times a day (QID) | ORAL | Status: DC | PRN
  Administered 2019-07-23 (×2): 30 mL via ORAL
  Filled 2019-07-23 (×2): qty 30

## 2019-07-23 NOTE — Progress Notes (Signed)
  Echocardiogram 2D Echocardiogram has been performed.  Deborah [name] 07/23/2019, 8:50 AM

## 2019-07-23 NOTE — Progress Notes (Signed)
Inpatient Diabetes Program Recommendations  AACE/ADA: New Consensus Statement on Inpatient Glycemic Control (2015)  Target Ranges:  Prepandial:   less than 140 mg/dL      Peak postprandial:   less than 180 mg/dL (1-2 hours)      Critically ill patients:  140 - 180 mg/dL   Lab Results  Component Value Date   GLUCAP 252 (H) 07/23/2019   HGBA1C 8.7 (H) 07/22/2019    Review of Glycemic Control  Current orders for Inpatient glycemic control: Lantus 35 units QD, Novolog 0-15 units Q4H  Will be NPO after MN for gastric emptying study  Inpatient Diabetes Program Recommendations:     Increase Novolog to 0-20 units Q4H.  Briefly spoke with pt this afternoon. Pt did not want to converse, as she was extremely sleepy. Pt stated she was not checking her blood sugars as much as she should, and needs to improve on diet. Discussed correlation between her blood sugar control and gastroparesis. Pt had eyes  closed and was nodding off.   Will see again in am.  Thank you. Lorenda Peck, RD, LDN, CDE Inpatient Diabetes Coordinator [phone]

## 2019-07-23 NOTE — Progress Notes (Signed)
PROGRESS NOTE    Deborah [name]  ULA:[id number] DOB: March 23, 1965 DOA: 07/21/2019 PCP: [name], DO     Brief Narrative:  54 y.o. BF PMHx Depression, CVA, chronic Systolic CHF (EF 40 to 21% on echo done 01/2019), paroxysmal A. fib on Eliquis, DM type II uncontrolled with complication, with history of recurrent admissions for DKA, Diabetic Gastroparesis intolerant to Reglan,, CKD stage III, HTN, Gout,  gastric ulcer, GERD, anemia of chronic disease, Chronic Pain syndrome,  morbid obesity (BMI 54.07), who has recent admission for exacerbation of gastroparesis 07/10/19- 07/19/19)  Presenting to the ED  with complaints of nausea and vomiting and diffuse abdominal pain. Patient states symptoms started around 6pm the evening prior to presentation. She notes no new foods, no sick contacts, no fever no chills, noted chest pain began s/p episode of n/v. Currently her main complaint is that of diffuse abdominal pain and nausea and vomiting. She noted no dysuria, no shortness of breath but generally feels unwell. She notes no exacerbation or alleviating factors regarding her symptoms. She also states that since discharge she has been complaint with all her medications.    Subjective: 5/17, negative CP, negative S OB.  States continued abdominal pain slightly improved from last night. States the physician in town had stopped her Reglan~1 month ago secondary to side effects.   Assessment & Plan:   Active Problems:   Essential hypertension, benign   Uncontrolled type 2 diabetes mellitus with diabetic neuropathy, with long-term current use of insulin (HCC)   CKD (chronic kidney disease), stage II   Diabetic gastroparesis (HCC)   Morbid obesity (HCC)   Anxiety   Intractable nausea and vomiting   Chronic systolic CHF (congestive heart failure) ([place])   Uncontrolled type 2 diabetes mellitus with hyperglycemia ([place])   Hyperosmolar hyperglycemic state (HHS) ([place])   Drug-seeking  behavior  Sepsis -Admission met criteria for sepsis HR> 90, RR>20, lactic acidosis -Doubt patient truly septic most likely secondary to HHNS however given patient's multiple medical conditions will continue antibiotics until cultures result. -Lactic acid normalized, procalcitonin on admission was WNL.  Patient's leukocytosis most likely secondary to demargination secondary to HHNS, uncontrolled essential HTN, noncompliance with medication. -Sepsis ruled out we will discontinue medication  Abdominal pain/gastroparesis -Most likely secondary to patient's uncontrolled diabetes. -Multiple admissions for the same complaint. -Erythromycin 250 mg TID some: Well on last admission -Thorazine IV 25 mg x 1  -Per mother patient was diagnosed with DM gastroparesis at [facility] however took no action concerning this condition.  5/17 will consult GI for a minimal of an outpatient follow-up.  Doubt patient would be a candidate for any sort of gastric pacing given her noncompliance  -5/17 Reglan 10 mg TID; watch closely for any side effects  Lactic acidosis -Resolved   Chronic systolic CHF -Strict in and out +1.8 L -Daily weight Filed Weights   07/23/19 0500  Weight: (!) 145.2 kg   -Patient has not been to see a cardiologist nor has she taken any initiative to take care of herself from a cardiac standpoint since she was diagnosed with CHF. -Obtain echocardiogram -5/17 discussed case with cardiology they will arrange outpatient appointment with Dr. Radford Pax.  Patient had appointment previously arranged with Dr. Fransico Him but did not follow-up.    Essential HTN -Metoprolol IV 5 mg PRN -Toprol 25 mg daily  -Hydralazine 5 mg QID -5/16 repeat EKG once sinus tachycardia slowed down  Paroxysmal A. Fib -Cont on Eliquis -Currently rate controlled  Atypical Chest  pain -associated with bouts of emesis -ekg poor quality repeat pending -mild increase in CE, note thought to be related to acs at  time point  -Reproducible with pressure on her left chest wall.  Therefore doubt cardiac in nature, although echocardiogram pending.  Lactic Acidosis -Most likely secondary to HHNS cultures pending -CT abdomen pelvis; nondiagnostic see results below -5/17 normal saline to [facility] -5/17 decrease D5-0.45% saline to 71m/hr; will continue as patient is not consuming any fluids or solid food.  Acute on CKD stage IIIa (baseline Cr 1.27) -Hold on nephrotoxic medication -Trend creatinine  DM type II uncontrolled with complication/HHNS -56/60hemoglobin A1c= 8.4 -5/16 Endo tool -5/17 Lantus 35 units -5/17 NovoLog 10 units qac -Moderate SSI    GI pain/GERD -5/16 GI cocktail  Hyperlipidemia -holding med for now    CVA -on secondary prophylaxis   Gout: No acute issues   Morbidly obese  -Prior to discharge will need to see diabetic coordinator and diabetic nutritionist.  Pain seeking behavior -Has consistently asked for Dilaudid.   Goals of care -5/17 diabetic coordinator consulted -5/17 LCSW consulted patient without PCP   DVT prophylaxis: Eliquis Code Status: Full Family Communication: 5/16 mother at bedside discussed plan of care answered all questions Disposition Plan:  Status is: Inpatient    Dispo: The patient is from: Home              Anticipated d/c is to: Cardiac rehab              Anticipated d/c date is: 26 May              Patient currently unstable      Consultants:     Procedures/Significant Events:  01/26/2019 Echocardiogram;Left Ventricle: Left ventricular ejection fraction, by visual estimation,  is 40 to 45%. The left ventricle has moderately decreased function. The  left ventricle demonstrates global hypokinesis. There is no left  ventricular hypertrophy.   Right Ventricle: The right ventricular size is normal. No increase in  right ventricular wall thickness. Global RV systolic function is has  normal systolic function.   Left  Atrium: Left atrial size was normal in size.   Right Atrium: Right atrial size was normal in size   Pericardium: A small pericardial effusion is present.   Mitral Valve: The mitral valve is normal in structure. Trace mitral valve  regurgitation.   Tricuspid Valve: The tricuspid valve is normal in structure. Tricuspid  valve regurgitation is trivial.   Aortic Valve: The aortic valve is tricuspid. Aortic valve regurgitation is  not visualized. Mild aortic valve sclerosis is present, with no evidence  of aortic valve stenosis.   Pulmonic Valve: The pulmonic valve was grossly normal. Pulmonic valve  regurgitation is not visualized.   Aorta: The aortic root, ascending aorta and aortic arch are all  structurally normal, with no evidence of dilitation or obstruction.   Pulmonary Artery: The pulmonary artery is not well seen.   Venous: The inferior vena cava is dilated in size with greater than 50%  respiratory variability, suggesting right atrial pressure of 8 mmHg.   IAS/Shunts: No atrial level shunt detected by color flow Doppler.  --------------------------------------------------------------------------------------------------------------------------------------  5/16 CT abdomen pelvis W0 contrast;No acute findings within the abdomen or pelvis. No bowel obstruction or evidence of bowel wall inflammation. No evidence of acute solid organ abnormality. No renal or ureteral calculi. Appendix is normal. 2. Uterine fibroid measures 5 cm. 3. Degenerative spondylosis of the lower lumbar spine, mild to moderate in  degree.  I have personally reviewed and interpreted all radiology studies and my findings are as above.  VENTILATOR SETTINGS:    Cultures 5/15 urine positive multiple species 5/16 MRSA by PCR negative 5/16 HIV nonreactive 5/16 SARS coronavirus negative    Antimicrobials: Anti-infectives (From admission, onward)   Start     Dose/Rate Stop   07/22/19 0800   erythromycin (E-MYCIN) tablet 250 mg     250 mg     07/22/19 0800  ciprofloxacin (CIPRO) IVPB 400 mg  Status:  Discontinued     400 mg 200 mL/hr over 60 Minutes 07/23/19 0833   07/22/19 0800  metroNIDAZOLE (FLAGYL) IVPB 500 mg  Status:  Discontinued     500 mg 100 mL/hr over 60 Minutes 07/23/19 0833       Devices    LINES / TUBES:      Continuous Infusions: . sodium chloride Stopped (07/22/19 1325)  . ciprofloxacin 400 mg (07/22/19 1935)  . dextrose 5 % and 0.45% NaCl Stopped (07/23/19 0409)  . metronidazole Stopped (07/23/19 0051)     Objective: Vitals:   07/23/19 0325 07/23/19 0400 07/23/19 0500 07/23/19 0507  BP: (!) 207/116 (!) 179/104  (!) 123/51  Pulse: (!) 111 100  (!) 106  Resp: (!) 32 16  (!) 22  Temp:      TempSrc:      SpO2: 99% 99%  97%  Weight:   (!) 145.2 kg   Height:   '5\' 6"'  (1.676 m)     Intake/Output Summary (Last 24 hours) at 07/23/2019 3149 Last data filed at 07/22/2019 1654 Gross per 24 hour  Intake 1801.06 ml  Output 1200 ml  Net 601.06 ml   Filed Weights   07/23/19 0500  Weight: (!) 145.2 kg   Physical Exam:  General: A/O x4, no acute respiratory distress Eyes: negative scleral hemorrhage, negative anisocoria, negative icterus ENT: Negative Runny nose, negative gingival bleeding, Neck:  Negative scars, masses, torticollis, lymphadenopathy, JVD Lungs: Clear to auscultation bilaterally without wheezes or crackles Cardiovascular: Regular rate and rhythm without murmur gallop or rub normal S1 and S2 Abdomen: MORBIDLY OBESE, positive abdominal pain (improved from 5/16), nondistended, positive soft, bowel sounds, no rebound, no ascites, no appreciable mass Extremities: No significant cyanosis, clubbing, or edema bilateral lower extremities Skin: Negative rashes, lesions, ulcers Psychiatric: Positive depression, positive anxiety, negative fatigue, negative mania, noncompliant Central nervous system:  Cranial nerves II through XII intact,  tongue/uvula midline, all extremities muscle strength 5/5, sensation intact throughout,  negative dysarthria, negative expressive aphasia, negative receptive aphasia.  .     Data Reviewed: Care during the described time interval was provided by me .  I have reviewed this patient's available data, including medical history, events of note, physical examination, and all test results as part of my evaluation.  CBC: Recent Labs  Lab 07/17/19 0505 07/17/19 0505 07/18/19 0419 07/19/19 0316 07/21/19 2005 07/22/19 2016 07/23/19 0122  WBC 10.0   < > 10.5 9.3 17.6* 18.1* 16.4*  NEUTROABS 6.5  --  6.3  --   --  14.7*  --   HGB 7.6*   < > 7.2* 7.3* 9.2* 9.0* 8.5*  HCT 25.1*   < > 24.3* 24.4* 30.0* 30.0* 27.5*  MCV 87.8   < > 88.7 89.4 87.7 89.8 87.0  PLT 425*   < > 432* 418* 560* 482* 318   < > = values in this interval not displayed.   Basic Metabolic Panel: Recent Labs  Lab 07/17/19 0505  07/17/19 0505 07/18/19 0419 07/18/19 0419 07/19/19 0316 07/21/19 2335 07/22/19 1529 07/22/19 1905 07/23/19 0122  NA 136   < > 136   < > 138 133* 140 139 137  K 3.1*   < > 3.4*   < > 3.7 3.8 3.2* 3.6 3.9  CL 98   < > 101   < > 103 92* 102 99 103  CO2 26   < > 26   < > 27 21* '30 27 22  ' GLUCOSE 273*   < > 210*   < > 166* 411* 200* 165* 169*  BUN 20   < > 24*   < > 24* 25* '18 19 16  ' CREATININE 1.94*   < > 1.45*   < > 1.26* 1.65* 1.54* 1.86* 1.54*  CALCIUM 8.4*   < > 8.1*   < > 8.6* 8.9 8.4* 8.4* 8.0*  MG 2.1  --  1.9  --   --   --   --  1.5*  --   PHOS 4.2  --  3.3  --   --   --   --  3.1  --    < > = values in this interval not displayed.   GFR: Estimated Creatinine Clearance: 61.1 mL/min (A) (by C-G formula based on SCr of 1.54 mg/dL (H)). Liver Function Tests: Recent Labs  Lab 07/17/19 0505 07/18/19 0419 07/21/19 2335 07/23/19 0122  AST 12* 10* 21 11*  ALT '11 9 13 11  ' ALKPHOS 94 89 101 81  BILITOT 0.4 0.6 1.0 0.6  PROT 6.5 6.1* 7.8 6.6  ALBUMIN 2.9* 2.7* 3.6 3.1*   Recent Labs   Lab 07/21/19 2335  LIPASE 23   No results for input(s): AMMONIA in the last 168 hours. Coagulation Profile: No results for input(s): INR, PROTIME in the last 168 hours. Cardiac Enzymes: No results for input(s): CKTOTAL, CKMB, CKMBINDEX, TROPONINI in the last 168 hours. BNP (last 3 results) No results for input(s): PROBNP in the last 8760 hours. HbA1C: Recent Labs    07/22/19 1529  HGBA1C 8.7*   CBG: Recent Labs  Lab 07/22/19 2335 07/23/19 0100 07/23/19 0204 07/23/19 0315 07/23/19 0411  GLUCAP 139* 174* 155* 178* 185*   Lipid Profile: Recent Labs    07/22/19 1529  CHOL 104  HDL 30*  LDLCALC 37  TRIG 185*  CHOLHDL 3.5   Thyroid Function Tests: Recent Labs    07/22/19 1529  TSH 2.576   Anemia Panel: No results for input(s): VITAMINB12, FOLATE, FERRITIN, TIBC, IRON, RETICCTPCT in the last 72 hours. Sepsis Labs: Recent Labs  Lab 07/21/19 2357 07/22/19 0245 07/22/19 1906 07/23/19 0615  LATICACIDVEN 5.5* 4.1* 3.2* 0.9    Recent Results (from the past 240 hour(s))  Urine culture     Status: Abnormal   Collection Time: 07/21/19 11:36 PM   Specimen: Urine, Clean Catch  Result Value Ref Range Status   Specimen Description   Final    URINE, CLEAN CATCH Performed at [facility], [place] [address] [place], [place] [address]    Special Requests   Final    NONE Performed at [facility], [place] [address], [place], [place] [address]    Culture MULTIPLE SPECIES PRESENT, SUGGEST RECOLLECTION (A)  Final   Report Status 07/23/2019 FINAL  Final  SARS Coronavirus 2 by RT PCR (hospital order, performed in [facility] hospital lab) Nasopharyngeal Nasopharyngeal Swab     Status: None   Collection Time: 07/22/19 11:11 PM  Specimen: Nasopharyngeal Swab  Result Value Ref Range Status   SARS Coronavirus 2 NEGATIVE NEGATIVE Final    Comment: (NOTE) SARS-CoV-2 target nucleic acids are NOT DETECTED. The SARS-CoV-2 RNA is generally  detectable in upper and lower respiratory specimens during the acute phase of infection. The lowest concentration of SARS-CoV-2 viral copies this assay can detect is 250 copies / mL. A negative result does not preclude SARS-CoV-2 infection and should not be used as the sole basis for treatment or other patient management decisions.  A negative result may occur with improper specimen collection / handling, submission of specimen other than nasopharyngeal swab, presence of viral mutation(s) within the areas targeted by this assay, and inadequate number of viral copies (<250 copies / mL). A negative result must be combined with clinical observations, patient history, and epidemiological information. Fact Sheet for Patients:   StrictlyIdeas.no Fact Sheet for Healthcare Providers: BankingDealers.co.za This test is not yet approved or cleared  by the [place] FDA and has been authorized for detection and/or diagnosis of SARS-CoV-2 by FDA under an Emergency Use Authorization (EUA).  This EUA will remain in effect (meaning this test can be used) for the duration of the COVID-19 declaration under Section 564(b)(1) of the Act, 21 U.S.C. section 360bbb-3(b)(1), unless the authorization is terminated or revoked sooner. Performed at [facility], [place] [address], [place], [place] [address]   MRSA PCR Screening     Status: None   Collection Time: 07/22/19 11:44 PM   Specimen: Nasopharyngeal  Result Value Ref Range Status   MRSA by PCR NEGATIVE NEGATIVE Final    Comment:        The GeneXpert MRSA Assay (FDA approved for NASAL specimens only), is one component of a comprehensive MRSA colonization surveillance program. It is not intended to diagnose MRSA infection nor to guide or monitor treatment for MRSA infections. Performed at [facility], [place] [address] [place], [place] [address]           Radiology Studies: CT ABDOMEN PELVIS WO CONTRAST  Result Date: 07/22/2019 CLINICAL DATA:  Diffuse abdominal pain, nausea and vomiting. EXAM: CT ABDOMEN AND PELVIS WITHOUT CONTRAST TECHNIQUE: Multidetector CT imaging of the abdomen and pelvis was performed following the standard protocol without IV contrast. COMPARISON:  CT abdomen dated 07/10/2019. FINDINGS: Lower chest: No acute abnormality. Hepatobiliary: No focal liver abnormality is seen. Status post cholecystectomy. No biliary dilatation. Pancreas: Unremarkable. No pancreatic ductal dilatation or surrounding inflammatory changes. Spleen: Normal in size without focal abnormality. Adrenals/Urinary Tract: Adrenal glands appear normal. Kidneys are unremarkable without mass, stone or hydronephrosis. No ureteral or bladder calculi. Bladder appears normal. Stomach/Bowel: No dilated large or small bowel loops. No evidence of bowel wall inflammation. Appendix is normal. Stomach is unremarkable. Vascular/Lymphatic: No significant vascular findings are present. No enlarged abdominal or pelvic lymph nodes. Reproductive: Uterine fibroid measures 5 cm. No acute findings. Again noted is some degree of pelvic floor laxity. Other: No free fluid or abscess collection. No free intraperitoneal air. Musculoskeletal: Degenerative spondylosis of the lower lumbar spine, mild to moderate in degree. No acute or suspicious osseous finding. IMPRESSION: 1. No acute findings within the abdomen or pelvis. No bowel obstruction or evidence of bowel wall inflammation. No evidence of acute solid organ abnormality. No renal or ureteral calculi. Appendix is normal. 2. Uterine fibroid measures 5 cm. 3. Degenerative spondylosis of the lower lumbar spine, mild to moderate in degree. Electronically Signed   By: Roxy Horseman.D.  On: 07/22/2019 09:14   DG Chest 2 View  Result Date: 07/21/2019 CLINICAL DATA:  Chest pain EXAM: CHEST - 2 VIEW COMPARISON:  07/02/2019 FINDINGS: Mild  cardiomegaly. No focal opacity or pleural effusion. No pneumothorax. IMPRESSION: Mild cardiomegaly. No edema or infiltrate. Electronically Signed   By: Donavan Foil M.D.   On: 07/21/2019 20:19        Scheduled Meds: . apixaban  5 mg Oral BID  . Chlorhexidine Gluconate Cloth  6 each Topical Daily  . dicyclomine  10 mg Oral Once  . DULoxetine  40 mg Oral Daily  . erythromycin  250 mg Oral TID WC & HS  . hydrALAZINE  5 mg Intravenous Q6H  . insulin aspart  0-15 Units Subcutaneous Q4H  . insulin glargine  35 Units Subcutaneous Daily  . isosorbide mononitrate  60 mg Oral Daily  . metoprolol succinate  25 mg Oral Daily  .  morphine injection  1 mg Intravenous Once  . pantoprazole (PROTONIX) IV  40 mg Intravenous Q24H   Continuous Infusions: . sodium chloride Stopped (07/22/19 1325)  . ciprofloxacin 400 mg (07/22/19 1935)  . dextrose 5 % and 0.45% NaCl Stopped (07/23/19 0409)  . metronidazole Stopped (07/23/19 0051)     LOS: 1 day    Time spent:40 min    Conni Knighton, Geraldo Docker, MD [facility] Pager [phone]  If 7PM-7AM, please contact night-coverage [email or web address] Password [facility] 07/23/2019, 7:24 AM

## 2019-07-23 NOTE — Consult Note (Signed)
Referring Provider: [facility] Primary Care Physician:  [name], DO Primary Gastroenterologist:  Lance Bosch  Reason for Consultation:  Intractable nausea and vomiting  HPI: Deborah [name] is a 54 y.o. female with history pertient for diabetic gastroparesis (last hospitalization 11/10/47-6/75/91), chronic systolic CHF (EF 63-84%), A. fib (on Eliquis), insulin-dependent type 2 DM and history of DKA, CKD stage III, HTN, CVA, gout, GERD, anemia, chronic pain,and morbid obesity (BMI >50) presenting with intractable nausea and vomiting from underlying diabetic gastroparesis.  Patient was recently hospitalized from 07/10/2019 to 07/19/2019 and was treated with erythromycin as well as Reglan x2 doses (she declined the third dose).  Patient developed tremors which was thought to be secondary to oxycodone use (tremors started 1-2 days after 2nd dose of Reglan).  Patient improved and was discharged on erythromycin.  Patient reports persistent abdominal pain, nausea, and vomiting since discharge on 07/19/2019.  Patient states she has been compliant with her discharge medications including erythromycin.  Patient states she mostly vomits bile and sometimes brown liquid but denies any black or red vomitus.  She reports diffuse abdominal pain and chest pain.  Patient has not had a bowel movement today but has previously reported loose stools while on erythromycin.  Denies melena or hematochezia.  Past Medical History:  Diagnosis Date  . Acute back pain with sciatica, left   . Acute back pain with sciatica, right   . AKI (acute kidney injury) ([place])   . Anemia, unspecified   . Chronic pain   . Chronic systolic CHF (congestive heart failure) ([place])   . Diabetes mellitus   . Esophageal reflux   . Fibromyalgia   . Gastric ulcer   . Gastroparesis   . Gout   . Hyperlipidemia   . Hypertension   . Lumbosacral stenosis   . NICM (nonischemic cardiomyopathy) ([place])   . Obesity   . PAF  (paroxysmal atrial fibrillation) ([place])   . Stroke ([place]) 02/2011  . Vitamin B12 deficiency anemia     Past Surgical History:  Procedure Laterality Date  . CATARACT EXTRACTION  01/2014  . CHOLECYSTECTOMY      Prior to Admission medications   Medication Sig Start Date End Date Taking? Authorizing Provider  atorvastatin (LIPITOR) 10 MG tablet Take 1 tablet (10 mg total) by mouth daily. 12/13/18  Yes Lockamy, Christia Reading, DO  cetirizine (ZYRTEC) 10 MG tablet Take 1 tablet (10 mg total) by mouth daily. 12/13/18  Yes Lockamy, Timothy, DO  DULoxetine HCl 40 MG CPEP TAKE ONE CAPSULE BY MOUTH TWICE DAILY Patient taking differently: Take 40 mg by mouth in the morning and at bedtime.  06/04/19  Yes Lockamy, Timothy, DO  ELIQUIS 5 MG TABS tablet TAKE 1 TABLET (5 MG TOTAL) BY MOUTH 2 (TWO) TIMES DAILY. Patient taking differently: Take 5 mg by mouth 2 (two) times daily.  07/19/19  Yes Lockamy, Timothy, DO  erythromycin (E-MYCIN) 250 MG tablet Take 1 tablet (250 mg total) by mouth 4 (four) times daily -  with meals and at bedtime for 7 days. 07/19/19 07/26/19 Yes Gherghe, Vella Redhead, MD  fluticasone (FLONASE) 50 MCG/ACT nasal spray Place 2 sprays into both nostrils daily as needed for allergies or rhinitis. 12/19/18  Yes Rai, Ripudeep K, MD  hydrALAZINE (APRESOLINE) 25 MG tablet Take 1 tablet (25 mg total) by mouth 3 (three) times daily. For blood pressure 07/19/19 10/17/19 Yes Lockamy, Timothy, DO  insulin aspart (NOVOLOG FLEXPEN) 100 UNIT/ML FlexPen Inject 15 Units into the skin 3 (three) times daily with  meals. Patient taking differently: Inject 30 Units into the skin 3 (three) times daily with meals.  06/26/19  Yes Mariel Aloe, MD  isosorbide mononitrate (IMDUR) 60 MG 24 hr tablet Take 1 tablet (60 mg total) by mouth daily. 07/05/19  Yes Eugenie Filler, MD  loperamide (IMODIUM) 2 MG capsule Take 2 mg by mouth 4 (four) times daily as needed for diarrhea or loose stools.   Yes [provider]  magnesium  oxide (MAG-OX) 400 (241.3 Mg) MG tablet Take 1 tablet (400 mg total) by mouth 2 (two) times daily. 06/14/19  Yes Pokhrel, Laxman, MD  metoprolol succinate (TOPROL-XL) 25 MG 24 hr tablet Take 25 mg by mouth daily. 06/01/19  Yes [provider]  torsemide (DEMADEX) 20 MG tablet Take 2 tablets (40 mg total) by mouth daily. Hold if having significant vomiting 07/06/19  Yes Eugenie Filler, MD  albuterol (PROVENTIL) (2.5 MG/3ML) 0.083% nebulizer solution Take 3 mLs (2.5 mg total) by nebulization every 6 (six) hours as needed for wheezing or shortness of breath. 04/06/19   Scot Jun, FNP  amLODipine (NORVASC) 10 MG tablet Take 1 tablet (10 mg total) by mouth daily. 07/23/19   [name], DO  blood glucose meter kit and supplies KIT Dispense based on patient and insurance preference. Use up to four times daily as directed. (FOR ICD-9 250.00, 250.01). 12/13/18   [name], DO  dicyclomine (BENTYL) 20 MG tablet TAKE 1 TABLET (20 MG TOTAL) BY MOUTH 3 (THREE) TIMES DAILY AS NEEDED FOR SPASMS (ABDOMINAL CRAMPING). Patient not taking: Reported on 06/29/2019 06/22/19 07/22/19  [name], DO  LANTUS SOLOSTAR 100 UNIT/ML Solostar Pen Inject 45 Units into the skin at bedtime. 06/14/19   Pokhrel, Corrie Mckusick, MD  Melatonin 3 MG TABS Take 2 tablets (6 mg total) by mouth at bedtime. 12/13/18   [name], DO  nitroGLYCERIN (NITROSTAT) 0.4 MG SL tablet Place 1 tablet (0.4 mg total) under the tongue every 5 (five) minutes as needed for chest pain. 12/13/18   Lockamy, Christia Reading, DO  ondansetron (ZOFRAN-ODT) 4 MG disintegrating tablet Take 4 mg by mouth every 8 (eight) hours as needed for nausea or vomiting.  06/07/19   [provider]  pantoprazole (PROTONIX) 40 MG tablet TAKE ONE TABLET BY MOUTH TWICE DAILY 07/23/19   [name], DO    Scheduled Meds: . apixaban  5 mg Oral BID  . Chlorhexidine Gluconate Cloth  6 each Topical Daily  . DULoxetine  40 mg Oral Daily  . erythromycin   250 mg Oral TID WC & HS  . hydrALAZINE  5 mg Intravenous Q6H  . insulin aspart  0-15 Units Subcutaneous Q4H  . insulin glargine  35 Units Subcutaneous Daily  . isosorbide mononitrate  60 mg Oral Daily  . metoCLOPramide (REGLAN) injection  10 mg Intravenous Q8H  . metoprolol succinate  25 mg Oral Daily  .  morphine injection  1 mg Intravenous Once  . [START ON 07/24/2019] pantoprazole  40 mg Oral Daily   Continuous Infusions: . sodium chloride 10 mL/hr at 07/23/19 1400  . dextrose 5 % and 0.45% NaCl Stopped (07/23/19 0409)   PRN Meds:.albuterol, ALPRAZolam, alum & mag hydroxide-simeth, dextrose, hydrALAZINE, metoprolol tartrate, morphine injection, nitroGLYCERIN, ondansetron **OR** ondansetron (ZOFRAN) IV  Allergies as of 07/21/2019 - Review Complete 07/21/2019  Allergen Reaction Noted  . Contrast media [iodinated diagnostic agents] Anaphylaxis 11/28/2017  . Diazepam [name] 09/21/2011  . Isovue [iopamidol] Anaphylaxis 11/28/2017  . Lisinopril Anaphylaxis 09/21/2011  .  Penicillins Palpitations 02/04/2011  . Acetaminophen Nausea Only and Other (See Comments) 08/28/2015  . Tolmetin Nausea Only and Other (See Comments) 09/21/2015  . Aspirin Other (See Comments) 09/26/2015  . Nsaids Other (See Comments) 09/21/2015  . Tramadol Nausea And Vomiting 12/22/2015    Family History  Problem Relation Age of Onset  . Diabetes Mother   . Diabetes Father   . Heart disease Father   . Diabetes Sister   . Congestive Heart Failure Sister 62  . Diabetes Brother     Social History   Socioeconomic History  . Marital status: Single    Spouse name: Not on file  . Number of children: Not on file  . Years of education: Not on file  . Highest education level: Not on file  Occupational History  . Not on file  Tobacco Use  . Smoking status: Never Smoker  . Smokeless tobacco: Never Used  Substance and Sexual Activity  . Alcohol use: No  . Drug use: No  . Sexual activity: Not  Currently    Birth control/protection: None  Other Topics Concern  . Not on file  Social History Narrative   ** Merged History Encounter **       Social Determinants of Health   Financial Resource Strain:   . Difficulty of Paying Living Expenses:   Food Insecurity:   . Worried About Charity fundraiser in the Last Year:   . Arboriculturist in the Last Year:   Transportation Needs:   . Film/video editor (Medical):   [name] Lack of Transportation (Non-Medical):   Physical Activity:   . Days of Exercise per Week:   . Minutes of Exercise per Session:   Stress:   . Feeling of Stress :   Social Connections:   . Frequency of Communication with Friends and Family:   . Frequency of Social Gatherings with Friends and Family:   . Attends Religious Services:   . Active Member of Clubs or Organizations:   . Attends Archivist Meetings:   [name] Marital Status:   Intimate Partner Violence:   . Fear of Current or Ex-Partner:   . Emotionally Abused:   [name] Physically Abused:   . Sexually Abused:     Review of Systems: Review of Systems  Constitutional: Negative for chills and fever.  HENT: Negative for hearing loss and tinnitus.   Eyes: Negative for blurred vision and pain.  Respiratory: Negative for cough and shortness of breath.   Cardiovascular: Positive for chest pain. Negative for palpitations.  Gastrointestinal: Positive for abdominal pain, diarrhea, nausea and vomiting. Negative for blood in stool, constipation, heartburn and melena.  Genitourinary: Negative for dysuria and hematuria.  Musculoskeletal: Negative for myalgias and neck pain.  Neurological: Negative for seizures and loss of consciousness.  Endo/Heme/Allergies: Negative for polydipsia. Does not bruise/bleed easily.  Psychiatric/Behavioral: Negative for substance abuse. The patient is not nervous/anxious.    Physical Exam: Vital signs: Vitals:   07/23/19 1230 07/23/19 1400  BP: (!) 106/55 (!) 162/93  Pulse:  98 (!) 102  Resp: 19 15  Temp:    SpO2: 96% 99%     Physical Exam  Constitutional: She is oriented to person, place, and time. She appears well-developed and well-nourished. She appears lethargic. No distress.  Morbid obesity  HENT:  Head: Normocephalic and atraumatic.  Eyes: Conjunctivae are normal. No scleral icterus.  Cardiovascular: Regular rhythm and normal heart sounds. Tachycardia present.  Pulmonary/Chest: Effort normal and breath sounds  normal. No respiratory distress.  Abdominal: Soft. Bowel sounds are normal. She exhibits no distension and no mass. There is abdominal tenderness (mild, diffuse). There is no rebound and no guarding.  Musculoskeletal:        General: No deformity or edema.     Cervical back: Normal range of motion and neck supple.  Neurological: She is oriented to person, place, and time. She appears lethargic.  Skin: Skin is warm and dry.  Psychiatric: She has a normal mood and affect. Her behavior is normal.     GI:  Lab Results: Recent Labs    07/21/19 2005 07/22/19 2016 07/23/19 0122  WBC 17.6* 18.1* 16.4*  HGB 9.2* 9.0* 8.5*  HCT 30.0* 30.0* 27.5*  PLT 560* 482* 318   BMET Recent Labs    07/22/19 1529 07/22/19 1905 07/23/19 0122  NA 140 139 137  K 3.2* 3.6 3.9  CL 102 99 103  CO2 '30 27 22  ' GLUCOSE 200* 165* 169*  BUN '18 19 16  ' CREATININE 1.54* 1.86* 1.54*  CALCIUM 8.4* 8.4* 8.0*   LFT Recent Labs    07/23/19 0122  PROT 6.6  ALBUMIN 3.1*  AST 11*  ALT 11  ALKPHOS 81  BILITOT 0.6   PT/INR No results for input(s): LABPROT, INR in the last 72 hours.   Studies/Results: CT ABDOMEN PELVIS WO CONTRAST  Result Date: 07/22/2019 CLINICAL DATA:  Diffuse abdominal pain, nausea and vomiting. EXAM: CT ABDOMEN AND PELVIS WITHOUT CONTRAST TECHNIQUE: Multidetector CT imaging of the abdomen and pelvis was performed following the standard protocol without IV contrast. COMPARISON:  CT abdomen dated 07/10/2019. FINDINGS: Lower chest: No  acute abnormality. Hepatobiliary: No focal liver abnormality is seen. Status post cholecystectomy. No biliary dilatation. Pancreas: Unremarkable. No pancreatic ductal dilatation or surrounding inflammatory changes. Spleen: Normal in size without focal abnormality. Adrenals/Urinary Tract: Adrenal glands appear normal. Kidneys are unremarkable without mass, stone or hydronephrosis. No ureteral or bladder calculi. Bladder appears normal. Stomach/Bowel: No dilated large or small bowel loops. No evidence of bowel wall inflammation. Appendix is normal. Stomach is unremarkable. Vascular/Lymphatic: No significant vascular findings are present. No enlarged abdominal or pelvic lymph nodes. Reproductive: Uterine fibroid measures 5 cm. No acute findings. Again noted is some degree of pelvic floor laxity. Other: No free fluid or abscess collection. No free intraperitoneal air. Musculoskeletal: Degenerative spondylosis of the lower lumbar spine, mild to moderate in degree. No acute or suspicious osseous finding. IMPRESSION: 1. No acute findings within the abdomen or pelvis. No bowel obstruction or evidence of bowel wall inflammation. No evidence of acute solid organ abnormality. No renal or ureteral calculi. Appendix is normal. 2. Uterine fibroid measures 5 cm. 3. Degenerative spondylosis of the lower lumbar spine, mild to moderate in degree. Electronically Signed   By: Franki Cabot M.D.   On: 07/22/2019 09:14   DG Chest 2 View  Result Date: 07/21/2019 CLINICAL DATA:  Chest pain EXAM: CHEST - 2 VIEW COMPARISON:  07/02/2019 FINDINGS: Mild cardiomegaly. No focal opacity or pleural effusion. No pneumothorax. IMPRESSION: Mild cardiomegaly. No edema or infiltrate. Electronically Signed   By: Donavan Foil M.D.   On: 07/21/2019 20:19   ECHOCARDIOGRAM COMPLETE  Result Date: 07/23/2019    ECHOCARDIOGRAM REPORT   Patient Name:   Deborah [name] Date of Exam: 07/23/2019 Medical Rec #:  [id number]            Height:       66.0  in Accession #:    [id number]  Weight:       320.1 lb Date of Birth:  August 11, 1965            BSA:          2.442 m Patient Age:    25 years             BP:           123/51 mmHg Patient Gender: F                    HR:           111 bpm. Exam Location:  Inpatient Procedure: 2D Echo, Cardiac Doppler and Color Doppler Indications:    Congestive Heart Failure 428.0 / I50.9  History:        Patient has prior history of Echocardiogram examinations, most                 recent 01/26/2019. CHF, Stroke and COPD, Arrythmias:Cardiac                 Arrest; Risk Factors:Hypertension, Diabetes and Non-Smoker.                 GERD. Elevated troponin.  Sonographer:    Vickie Epley RDCS Referring Phys: [id number] [place]  1. Left ventricular ejection fraction, by estimation, is 45 to 50%. The left ventricle has mildly decreased function. The left ventricle demonstrates global hypokinesis. Left ventricular diastolic function could not be evaluated.  2. Right ventricular systolic function is normal. The right ventricular size is normal. There is moderately elevated pulmonary artery systolic pressure.  3. The mitral valve is normal in structure. Trivial mitral valve regurgitation. No evidence of mitral stenosis.  4. The aortic valve is tricuspid. Aortic valve regurgitation is not visualized. Mild aortic valve sclerosis is present, with no evidence of aortic valve stenosis.  5. The inferior vena cava is normal in size with greater than 50% respiratory variability, suggesting right atrial pressure of 3 mmHg. FINDINGS  Left Ventricle: Left ventricular ejection fraction, by estimation, is 45 to 50%. The left ventricle has mildly decreased function. The left ventricle demonstrates global hypokinesis. The left ventricular internal cavity size was normal in size. There is  no left ventricular hypertrophy. Left ventricular diastolic function could not be evaluated. Right Ventricle: The right ventricular size is normal.  No increase in right ventricular wall thickness. Right ventricular systolic function is normal. There is moderately elevated pulmonary artery systolic pressure. The tricuspid regurgitant velocity is 3.35 m/s, and with an assumed right atrial pressure of 3 mmHg, the estimated right ventricular systolic pressure is 74.1 mmHg. Left Atrium: Left atrial size was normal in size. Right Atrium: Right atrial size was normal in size. Pericardium: A small pericardial effusion is present. Mitral Valve: The mitral valve is normal in structure. Trivial mitral valve regurgitation. No evidence of mitral valve stenosis. Tricuspid Valve: The tricuspid valve is normal in structure. Tricuspid valve regurgitation is trivial. No evidence of tricuspid stenosis. Aortic Valve: The aortic valve is tricuspid. Aortic valve regurgitation is not visualized. Mild aortic valve sclerosis is present, with no evidence of aortic valve stenosis. There is mild calcification of the aortic valve. Pulmonic Valve: The pulmonic valve was not well visualized. Pulmonic valve regurgitation is not visualized. No evidence of pulmonic stenosis. Aorta: The aortic root, ascending aorta and aortic arch are all structurally normal, with no evidence of dilitation or obstruction. Venous: The inferior vena cava is normal in size with greater  than 50% respiratory variability, suggesting right atrial pressure of 3 mmHg. IAS/Shunts: No atrial level shunt detected by color flow Doppler.  LEFT VENTRICLE PLAX 2D LVIDd:         5.50 cm LVIDs:         4.00 cm LV PW:         1.10 cm LV IVS:        1.10 cm LVOT diam:     2.30 cm LV SV:         74 LV SV Index:   30 LVOT Area:     4.15 cm  LV Volumes (MOD) LV vol d, MOD A2C: 175.0 ml LV vol d, MOD A4C: 173.0 ml LV vol s, MOD A2C: 86.2 ml LV vol s, MOD A4C: 91.2 ml LV SV MOD A2C:     88.8 ml LV SV MOD A4C:     173.0 ml LV SV MOD BP:      85.9 ml RIGHT VENTRICLE RV S prime:     14.70 cm/s TAPSE (M-mode): 2.3 cm LEFT ATRIUM              Index       RIGHT ATRIUM           Index LA diam:        4.60 cm 1.88 cm/m  RA Area:     12.00 cm LA Vol (A2C):   49.7 ml 20.35 ml/m RA Volume:   24.60 ml  10.07 ml/m LA Vol (A4C):   53.7 ml 21.99 ml/m LA Biplane Vol: 54.2 ml 22.19 ml/m  AORTIC VALVE LVOT Vmax:   102.00 cm/s LVOT Vmean:  70.100 cm/s LVOT VTI:    0.177 m  AORTA Ao Root diam: 3.40 cm MITRAL VALVE                TRICUSPID VALVE MV Area (PHT): 5.88 cm     TR Peak grad:   44.9 mmHg MV Decel Time: 129 msec     TR Vmax:        335.00 cm/s MR Peak grad: 87.2 mmHg MR Vmax:      467.00 cm/s   SHUNTS MV E velocity: 158.00 cm/s  Systemic VTI:  0.18 m                             Systemic Diam: 2.30 cm Buford Dresser MD Electronically signed by Buford Dresser MD Signature Date/Time: 07/23/2019/1:59:25 PM    Final     Assessment: Intractable nausea/vomiting, suspect this is related to diabetes; however, gastric emptying studies in 2017 and 2018 were normal.Blood glucose remains elevated.  Decreased renal function: BUN 19/ Cr 1.86  Chronic normocytic anemia, stable, Hgb8.5today.  No signs of GI bleeding.  Leukocytosis, WBCs 16.4 today  Plan: HOLD erythromycin and Reglan and NPO after midnight for gastric emptying study tomorrow morning.  After gastric emptying study, OK to resume these medications.  Continue Zofran 45m q6h PRN as needed for nausea and vomiting.  Continue low residue diet with small portions.  Blood glucose control.  Avoid narcotic use.  [facility] will follow.   LOS: 1 day   ASalley Slaughter PA-C 07/23/2019, 2:49 PM  Contact #  [phone]

## 2019-07-23 NOTE — TOC Initial Note (Signed)
Transition of Care [facility]) - Initial/Assessment Note    Patient Details  Name: Deborah [name] MRN: [id number] Date of Birth: [birth year]  Transition of Care [facility]) CM/SW Contact:    Leeroy Cha, RN Phone Number: 07/23/2019, 10:34 AM  Clinical Narrative:                 From home has pcp plan will be to return to home. Admitted with heart failure and copd, iv apresoline for high bp, Expected Discharge Plan: Home/Self Care Barriers to Discharge: Continued Medical Work up   Patient Goals and CMS Choice Patient states their goals for this hospitalization and ongoing recovery are:: to [email or web address] Compare Post Acute Care list provided to:: Patient Choice offered to / list presented to : Patient  Expected Discharge Plan and Services Expected Discharge Plan: Home/Self Care   Discharge Planning Services: CM Consult   Living arrangements for the past 2 months: Single Family Home                                      Prior Living Arrangements/Services Living arrangements for the past 2 months: Single Family Home Lives with:: Parents Patient language and need for interpreter reviewed:: No Do you feel safe going back to the place where you live?: Yes      Need for Family Participation in Patient Care: Yes (Comment) Care giver support system in place?: Yes (comment)   Criminal Activity/Legal Involvement Pertinent to Current Situation/Hospitalization: No - Comment as needed  Activities of Daily Living      Permission Sought/Granted                  Emotional Assessment Appearance:: Appears stated age Attitude/Demeanor/Rapport: Engaged Affect (typically observed): Calm Orientation: : Oriented to Self, Oriented to Place, Oriented to  Time, Oriented to Situation Alcohol / Substance Use: Not Applicable Psych Involvement: No (comment)  Admission diagnosis:  Dehydration [E86.0] Hyperglycemia [R73.9] Intractable nausea and vomiting  [R11.2] Non-intractable vomiting with nausea, unspecified vomiting type [R11.2] Hyperosmolar hyperglycemic state (HHS) ([place]) [E11.00, E11.65] Patient Active Problem List   Diagnosis Date Noted  . Hyperosmolar hyperglycemic state (HHS) ([place]) 07/22/2019  . Drug-seeking behavior 07/22/2019  . Lactic acidosis 07/10/2019  . Nonalcoholic fatty liver disease 06/05/2019  . Chronic diarrhea   . COPD exacerbation ([place]) 05/08/2019  . Acute on chronic HFrEF (heart failure with reduced ejection fraction) ([place])   . High risk social situation   . Restrictive lung disease secondary to obesity   . Physical deconditioning   . [name] 05/07/2019  . History of gastric ulcer   . Uncontrolled type 2 diabetes mellitus with hyperglycemia ([place])   . Fibroid uterus 02/23/2019  . Congestion of nasal sinus 01/24/2019  . Chronic systolic CHF (congestive heart failure) ([place]) 12/19/2018  . Intractable nausea and vomiting 12/17/2018  . Hypoxia 12/17/2018  . Hyperglycemia 12/17/2018  . Elevated troponin I level   . Vomiting 07/18/2018  . Abdominal pain 07/17/2018  . Hyperkalemia 07/17/2018  . Cardiac arrest ([place]) 11/29/2017  . Pelvic mass 11/29/2017  . Leukocytosis 11/29/2017  . Anxiety 11/29/2017  . Allergic reaction to contrast media, initial encounter 11/28/2017  . Palliative care encounter   . Back pain 03/19/2017  . Stroke (cerebrum) ([place]) 03/19/2017  . GERD (gastroesophageal reflux disease) 03/19/2017  . Depression 03/19/2017  . Morbid obesity ([place])   . Urinary tract infection 08/16/2016  .  Normocytic normochromic anemia 08/16/2016  . Gastroparesis 08/16/2016  . Intractable vomiting with nausea 06/17/2016  . Diabetic gastroparesis ([place]) 06/05/2016  . Gout 06/05/2016  . AKI (acute kidney injury) ([place]) 12/06/2015  . Chest pain 09/26/2015  . Hypokalemia 09/26/2015  . Hypomagnesemia 09/26/2015  . Nausea and vomiting 08/20/2015  . Gout flare 05/27/2015  . Lower abdominal pain 05/26/2015   . DKA (diabetic ketoacidoses) ([place]) 05/25/2015  . Uncontrolled type 2 diabetes mellitus with diabetic neuropathy, with long-term current use of insulin ([place]) 05/25/2015  . Type 2 diabetes mellitus with hyperglycemia, with long-term current use of insulin ([place]) 05/25/2015  . CKD (chronic kidney disease), stage II 05/25/2015  . Essential hypertension, benign 09/28/2013   PCP:  [name], DO Pharmacy:   [place], [place] - [address] [place] [address] Phone: [phone] Fax: [phone]     Social Determinants of Health (SDOH) Interventions    Readmission Risk Interventions Readmission Risk Prevention Plan 07/02/2019 06/25/2019 01/29/2019  Transportation Screening Complete Complete Complete  PCP or Specialist Appt within 3-5 Days - - Complete  Not Complete comments - - -  [place] or [place] - - Complete  Social Work Consult for [place] Planning/Counseling - - Complete  Palliative Care Screening - - Not Applicable  Medication Review Press photographer) Complete Complete Complete  PCP or Specialist appointment within 3-5 days of discharge - Complete -  [place] or [place] Complete - -  SW Recovery Care/Counseling Consult Complete Complete -  Palliative Care Screening Not Applicable Not Applicable -  [place] Not Applicable Not Applicable -  Some recent data might be hidden

## 2019-07-23 NOTE — Progress Notes (Signed)
The patient is receiving Protonix by the intravenous route.  Based on criteria approved by the Pharmacy and Therapeutics Committee and the Medical Executive Committee, the medication is being converted to the equivalent oral dose form.  These criteria include: -No active GI bleeding -Able to tolerate diet of full liquids (or better) or tube feeding -Able to tolerate other medications by the oral or enteral route  If you have any questions about this conversion, please contact the Pharmacy Department (phone [phone]).  Thank you. 

## 2019-07-24 LAB — COMPREHENSIVE METABOLIC PANEL
ALT: 8 U/L (ref 0–44)
AST: 11 U/L — ABNORMAL LOW (ref 15–41)
Albumin: 3 g/dL — ABNORMAL LOW (ref 3.5–5.0)
Alkaline Phosphatase: 79 U/L (ref 38–126)
Anion gap: 9 (ref 5–15)
BUN: 14 mg/dL (ref 6–20)
CO2: 27 mmol/L (ref 22–32)
Calcium: 8.2 mg/dL — ABNORMAL LOW (ref 8.9–10.3)
Chloride: 102 mmol/L (ref 98–111)
Creatinine, Ser: 1.51 mg/dL — ABNORMAL HIGH (ref 0.44–1.00)
GFR calc Af Amer: 45 mL/min — ABNORMAL LOW (ref >60)
GFR calc non Af Amer: 39 mL/min — ABNORMAL LOW (ref >60)
Glucose, Bld: 193 mg/dL — ABNORMAL HIGH (ref 70–99)
Potassium: 3.6 mmol/L (ref 3.5–5.1)
Sodium: 138 mmol/L (ref 135–145)
Total Bilirubin: 0.8 mg/dL (ref 0.3–1.2)
Total Protein: 6.4 g/dL — ABNORMAL LOW (ref 6.5–8.1)

## 2019-07-24 LAB — GLUCOSE, CAPILLARY
Glucose-Capillary: 154 mg/dL — ABNORMAL HIGH (ref 70–99)
Glucose-Capillary: 168 mg/dL — ABNORMAL HIGH (ref 70–99)
Glucose-Capillary: 176 mg/dL — ABNORMAL HIGH (ref 70–99)
Glucose-Capillary: 192 mg/dL — ABNORMAL HIGH (ref 70–99)
Glucose-Capillary: 287 mg/dL — ABNORMAL HIGH (ref 70–99)
Glucose-Capillary: 379 mg/dL — ABNORMAL HIGH (ref 70–99)

## 2019-07-24 LAB — CBC
HCT: 26.8 % — ABNORMAL LOW (ref 36.0–46.0)
Hemoglobin: 7.8 g/dL — ABNORMAL LOW (ref 12.0–15.0)
MCH: 26.6 pg (ref 26.0–34.0)
MCHC: 29.1 g/dL — ABNORMAL LOW (ref 30.0–36.0)
MCV: 91.5 fL (ref 80.0–100.0)
Platelets: 386 10*3/uL (ref 150–400)
RBC: 2.93 MIL/uL — ABNORMAL LOW (ref 3.87–5.11)
RDW: 15.5 % (ref 11.5–15.5)
WBC: 15.2 10*3/uL — ABNORMAL HIGH (ref 4.0–10.5)
nRBC: 0 % (ref 0.0–0.2)

## 2019-07-24 LAB — LACTIC ACID, PLASMA
Lactic Acid, Venous: 0.9 mmol/L (ref 0.5–1.9)
Lactic Acid, Venous: 2.5 mmol/L (ref 0.5–1.9)

## 2019-07-24 LAB — PHOSPHORUS: Phosphorus: 3.1 mg/dL (ref 2.5–4.6)

## 2019-07-24 LAB — PROCALCITONIN: Procalcitonin: <0.1 ng/mL

## 2019-07-24 LAB — MAGNESIUM: Magnesium: 1.7 mg/dL (ref 1.7–2.4)

## 2019-07-24 MED ORDER — MORPHINE SULFATE (PF) 2 MG/ML IV SOLN: 2.0000 mg | Freq: Four times a day (QID) | INTRAVENOUS | Status: DC | PRN

## 2019-07-24 MED ORDER — METOPROLOL SUCCINATE ER 50 MG PO TB24
50.0000 mg | ORAL_TABLET | Freq: Every day | ORAL | Status: DC
  Administered 2019-07-25 – 2019-08-09 (×16): 50 mg via ORAL
  Filled 2019-07-24 (×7): qty 1
  Filled 2019-07-24: qty 2
  Filled 2019-07-24: qty 1
  Filled 2019-07-24: qty 2
  Filled 2019-07-24 (×7): qty 1

## 2019-07-24 MED ORDER — METOPROLOL SUCCINATE ER 25 MG PO TB24: 37.5000 mg | ORAL_TABLET | Freq: Every day | ORAL | Status: DC

## 2019-07-24 MED ORDER — METOPROLOL SUCCINATE ER 25 MG PO TB24
25.0000 mg | ORAL_TABLET | Freq: Once | ORAL | Status: AC
  Administered 2019-07-24: 25 mg via ORAL
  Filled 2019-07-24: qty 1

## 2019-07-24 MED ORDER — ERYTHROMYCIN BASE 250 MG PO TABS
250.0000 mg | ORAL_TABLET | Freq: Three times a day (TID) | ORAL | Status: DC
  Administered 2019-07-24 (×2): 250 mg via ORAL
  Filled 2019-07-24 (×3): qty 1

## 2019-07-24 MED ORDER — LORAZEPAM 2 MG/ML IJ SOLN
0.5000 mg | Freq: Three times a day (TID) | INTRAMUSCULAR | Status: DC | PRN
  Administered 2019-07-26: 0.5 mg via INTRAVENOUS
  Filled 2019-07-24: qty 1

## 2019-07-24 NOTE — Progress Notes (Signed)
LA results back 2.5, pt just called out ot say she was SOB o2 sats 100 on RA, placed on o2 at 3 liters, CBG 379, HR 106, ST. B/P stable 110 Systolic. Lungs clear but diminished Patients drinking lots of juice not taking much in PO other than juices.  She has been up in chair most of the day and walked back and forth to [place] a couple of times.

## 2019-07-24 NOTE — Progress Notes (Signed)
Discussed with patient new order for fluid restriction, patient immediatly became angry raised her voice and demanded that doctor start IV fluids on her so that the lab would be able to find vein to draw blood.  Not complaint with request for her to hold off on juice with elevated blood sugar earlier this evening.

## 2019-07-24 NOTE — Progress Notes (Signed)
PROGRESS NOTE    Deborah MARKSBERRY  CNO:[id number] DOB: [birth year] DOA: 07/21/2019 PCP: [name], DO     Brief Narrative:  54 y.o. BF PMHx Depression, CVA, chronic Systolic CHF (EF 40 to 29% on echo done 01/2019), paroxysmal A. fib on Eliquis, DM type II uncontrolled with complication, with history of recurrent admissions for DKA, Diabetic Gastroparesis intolerant to Reglan,, CKD stage III, HTN, Gout,  gastric ulcer, GERD, anemia of chronic disease, Chronic Pain syndrome,  morbid obesity (BMI 54.07), who has recent admission for exacerbation of gastroparesis 07/10/19- 07/19/19)  Presenting to the ED  with complaints of nausea and vomiting and diffuse abdominal pain. Patient states symptoms started around 6pm the evening prior to presentation. She notes no new foods, no sick contacts, no fever no chills, noted chest pain began s/p episode of n/v. Currently her main complaint is that of diffuse abdominal pain and nausea and vomiting. She noted no dysuria, no shortness of breath but generally feels unwell. She notes no exacerbation or alleviating factors regarding her symptoms. She also states that since discharge she has been complaint with all her medications.    Subjective: 5/18  , negative CP, negative S OB.  States continued abdominal pain slightly improved from last night. States the physician in town had stopped her Reglan~1 month ago secondary to side effects.   Assessment & Plan:   Active Problems:   Essential hypertension, benign   Uncontrolled type 2 diabetes mellitus with diabetic neuropathy, with long-term current use of insulin (HCC)   CKD (chronic kidney disease), stage II   Diabetic gastroparesis (HCC)   Morbid obesity (HCC)   Anxiety   Intractable nausea and vomiting   Chronic systolic CHF (congestive heart failure) ([place])   Uncontrolled type 2 diabetes mellitus with hyperglycemia ([name])   Hyperosmolar hyperglycemic state (HHS) ([name])   Drug-seeking  behavior  Sepsis -Admission met criteria for sepsis HR> 90, RR>20, lactic acidosis -Doubt patient truly septic most likely secondary to HHNS however given patient's multiple medical conditions will continue antibiotics until cultures result. -Lactic acid normalized, procalcitonin on admission was WNL.  Patient's leukocytosis most likely secondary to demargination secondary to HHNS, uncontrolled essential HTN, noncompliance with medication. -5/18 spot check of lactic acid and procalcitonin again lead to the conclusion no infection. -Sepsis ruled out we will discontinue medication  Abdominal pain/gastroparesis -Most likely secondary to patient's uncontrolled diabetes. -Multiple admissions for the same complaint. -Erythromycin 250 mg TID some: Well on last admission -Thorazine IV 25 mg x 1  -Per mother patient was diagnosed with DM gastroparesis at [facility] however took no action concerning this condition.  5/17 will consult GI for a minimal of an outpatient follow-up.  Doubt patient would be a candidate for any sort of gastric pacing given her noncompliance  -5/17 Reglan 10 mg TID; watch closely for any side effects -5/18 discussed case with GI today we will perform gastric emptying study in 2 days.  (Patient MUST NOT BE ON ANY NARCOTICS until after study)  Lactic acidosis -5/18 elevated after drinking multiple sugary drinks which was unknown to nursing staff.  Patient on the edge of going back into HHNS -We will place sign on patient's door that she is just receive only diet drinks.  Leukocytosis -Most likely reactive.  Afebrile, urine culture nonspecific, blood culture pending procalcitonin pending, lactic acid pending  Drug-seeking behavior  Pain control -NO NARCOTICS  Chronic systolic CHF -Strict in and out +1.8 L -Daily weight Filed Weights   07/23/19 0500  07/24/19 0600  Weight: (!) 145.2 kg (!) 145.2 kg   -Patient has not been to see a cardiologist nor has she taken any  initiative to take care of herself from a cardiac standpoint since she was diagnosed with CHF. -Obtain echocardiogram -5/17 discussed case with cardiology they will arrange outpatient appointment with Dr. Radford Pax.  Patient had appointment previously arranged with Dr. Fransico Him but did not follow-up.  -Metoprolol IV 5 mg PRN -Hydralazine 5 mg QID -5/18 increase Toprol 50 mg daily  Pulmonary HTN -Not reported on echocardiogram from 6 months ago therefore most likely new. -See CHF  Essential HTN -See CHF  Paroxysmal A. Fib -Cont on Eliquis  Atypical Chest pain -associated with bouts of emesis -ekg poor quality repeat pending -mild increase in CE, note thought to be related to acs at time point  -Reproducible with pressure on her left chest wall.  Therefore doubt cardiac in nature, although echocardiogram; shows new onset pulmonary HTN see results below .  Lactic Acidosis -Most likely secondary to HHNS cultures pending -CT abdomen pelvis; nondiagnostic see results below -5/17 normal saline to [facility] -5/18 DC D5-0.45% saline    Acute on CKD stage IIIa (baseline Cr 1.27) -Hold on nephrotoxic medication -Trend creatinine  DM type II uncontrolled with complication/HHNS -4/65 hemoglobin A1c= 8.4 -5/16 Endo tool -5/17 Lantus 35 units -5/17 NovoLog 10 units qac -Moderate SSI    GI pain/GERD -5/16 GI cocktail  Hyperlipidemia -holding med for now    CVA -on secondary prophylaxis   Gout: No acute issues   Morbidly obese  -Prior to discharge will need to see diabetic coordinator and diabetic nutritionist.  Pain seeking behavior -Has consistently asked for Dilaudid.   Goals of care -5/17 diabetic coordinator consulted -5/17 LCSW consulted patient without PCP   DVT prophylaxis: Eliquis Code Status: Full Family Communication: 5/16 mother at bedside discussed plan of care answered all questions Disposition Plan:  Status is: Inpatient    Dispo: The patient  is from: Home              Anticipated d/c is to: Cardiac rehab              Anticipated d/c date is: 26 May              Patient currently unstable      Consultants:     Procedures/Significant Events:  01/26/2019 Echocardiogram;Left Ventricle: LVEF=40 to 45%.- Left ventricle has moderately decreased function. -The left ventricle demonstrates global hypokinesis.  --------------------------------------------------------------------------------------------------------------------------------------  5/16 CT abdomen pelvis W0 contrast;No acute findings within the abdomen or pelvis. No bowel obstruction or evidence of bowel wall inflammation. No evidence of acute solid organ abnormality. No renal or ureteral calculi. Appendix is normal. 2. Uterine fibroid measures 5 cm. 3. Degenerative spondylosis of the lower lumbar spine, mild to moderate in degree. 5/17 Echocardiogram;Left Ventricle: LVEF= 45 to 50%.-Left ventricle demonstrates global hypokinesis .-Left ventricular diastolic function could not be evaluated.   Right Ventricle: moderately elevated PASP: estimated right ventricular systolic pressure is 68.1 mmHg.        I have personally reviewed and interpreted all radiology studies and my findings are as above.  VENTILATOR SETTINGS:    Cultures 5/15 urine positive multiple species 5/16 MRSA by PCR negative 5/16 HIV nonreactive 5/16 SARS coronavirus negative 5/18 blood culture pending    Antimicrobials: Anti-infectives (From admission, onward)   Start     Dose/Rate Stop   07/22/19 0800  erythromycin (E-MYCIN) tablet 250 mg  250 mg     07/22/19 0800  ciprofloxacin (CIPRO) IVPB 400 mg  Status:  Discontinued     400 mg 200 mL/hr over 60 Minutes 07/23/19 0833   07/22/19 0800  metroNIDAZOLE (FLAGYL) IVPB 500 mg  Status:  Discontinued     500 mg 100 mL/hr over 60 Minutes 07/23/19 0833       Devices    LINES / TUBES:      Continuous Infusions: .  sodium chloride 10 mL/hr at 07/23/19 1400  . dextrose 5 % and 0.45% NaCl Stopped (07/23/19 0409)     Objective: Vitals:   07/24/19 0434 07/24/19 0542 07/24/19 0600 07/24/19 0800  BP: (!) 131/58 (!) 118/51  136/70  Pulse: 98   (!) 102  Resp: 15   (!) 5  Temp:    98.2 F (36.8 C)  TempSrc: Oral   Oral  SpO2: 96%   95%  Weight:   (!) 145.2 kg   Height:        Intake/Output Summary (Last 24 hours) at 07/24/2019 0943 Last data filed at 07/24/2019 0900 Gross per 24 hour  Intake 1353.31 ml  Output 300 ml  Net 1053.31 ml   Filed Weights   07/23/19 0500 07/24/19 0600  Weight: (!) 145.2 kg (!) 145.2 kg   Physical Exam:  General: A/O x4, no acute respiratory distress Eyes: negative scleral hemorrhage, negative anisocoria, negative icterus ENT: Negative Runny nose, negative gingival bleeding, Neck:  Negative scars, masses, torticollis, lymphadenopathy, JVD Lungs: Clear to auscultation bilaterally without wheezes or crackles Cardiovascular: Regular rate and rhythm without murmur gallop or rub normal S1 and S2 Abdomen: MORBIDLY OBESE, positive abdominal pain (improved from 5/16), nondistended, positive soft, bowel sounds, no rebound, no ascites, no appreciable mass Extremities: No significant cyanosis, clubbing, or edema bilateral lower extremities Skin: Negative rashes, lesions, ulcers Psychiatric: Positive depression, positive anxiety, negative fatigue, negative mania, noncompliant Central nervous system:  Cranial nerves II through XII intact, tongue/uvula midline, all extremities muscle strength 5/5, sensation intact throughout,  negative dysarthria, negative expressive aphasia, negative receptive aphasia.  .     Data Reviewed: Care during the described time interval was provided by me .  I have reviewed this patient's available data, including medical history, events of note, physical examination, and all test results as part of my evaluation.  CBC: Recent Labs  Lab  07/18/19 0419 07/18/19 0419 07/19/19 0316 07/21/19 2005 07/22/19 2016 07/23/19 0122 07/24/19 0253  WBC 10.5   < > 9.3 17.6* 18.1* 16.4* 15.2*  NEUTROABS 6.3  --   --   --  14.7*  --   --   HGB 7.2*   < > 7.3* 9.2* 9.0* 8.5* 7.8*  HCT 24.3*   < > 24.4* 30.0* 30.0* 27.5* 26.8*  MCV 88.7   < > 89.4 87.7 89.8 87.0 91.5  PLT 432*   < > 418* 560* 482* 318 386   < > = values in this interval not displayed.   Basic Metabolic Panel: Recent Labs  Lab 07/18/19 0419 07/19/19 0316 07/21/19 2335 07/22/19 1529 07/22/19 1905 07/23/19 0122 07/24/19 0253  NA 136   < > 133* 140 139 137 138  K 3.4*   < > 3.8 3.2* 3.6 3.9 3.6  CL 101   < > 92* 102 99 103 102  CO2 26   < > 21* '30 27 22 27  ' GLUCOSE 210*   < > 411* 200* 165* 169* 193*  BUN 24*   < >  25* '18 19 16 14  ' CREATININE 1.45*   < > 1.65* 1.54* 1.86* 1.54* 1.51*  CALCIUM 8.1*   < > 8.9 8.4* 8.4* 8.0* 8.2*  MG 1.9  --   --   --  1.5*  --  1.7  PHOS 3.3  --   --   --  3.1  --  3.1   < > = values in this interval not displayed.   GFR: Estimated Creatinine Clearance: 62.3 mL/min (A) (by C-G formula based on SCr of 1.51 mg/dL (H)). Liver Function Tests: Recent Labs  Lab 07/18/19 0419 07/21/19 2335 07/23/19 0122 07/24/19 0253  AST 10* 21 11* 11*  ALT '9 13 11 8  ' ALKPHOS 89 101 81 79  BILITOT 0.6 1.0 0.6 0.8  PROT 6.1* 7.8 6.6 6.4*  ALBUMIN 2.7* 3.6 3.1* 3.0*   Recent Labs  Lab 07/21/19 2335  LIPASE 23   No results for input(s): AMMONIA in the last 168 hours. Coagulation Profile: No results for input(s): INR, PROTIME in the last 168 hours. Cardiac Enzymes: No results for input(s): CKTOTAL, CKMB, CKMBINDEX, TROPONINI in the last 168 hours. BNP (last 3 results) No results for input(s): PROBNP in the last 8760 hours. HbA1C: Recent Labs    07/22/19 1529  HGBA1C 8.7*   CBG: Recent Labs  Lab 07/23/19 1634 07/23/19 2005 07/24/19 0028 07/24/19 0434 07/24/19 0747  GLUCAP 287* 219* 192* 168* 154*   Lipid Profile: Recent  Labs    07/22/19 1529  CHOL 104  HDL 30*  LDLCALC 37  TRIG 185*  CHOLHDL 3.5   Thyroid Function Tests: Recent Labs    07/22/19 1529  TSH 2.576   Anemia Panel: No results for input(s): VITAMINB12, FOLATE, FERRITIN, TIBC, IRON, RETICCTPCT in the last 72 hours. Sepsis Labs: Recent Labs  Lab 07/21/19 2357 07/22/19 0245 07/22/19 1906 07/23/19 0615  LATICACIDVEN 5.5* 4.1* 3.2* 0.9    Recent Results (from the past 240 hour(s))  Urine culture     Status: Abnormal   Collection Time: 07/21/19 11:36 PM   Specimen: Urine, Clean Catch  Result Value Ref Range Status   Specimen Description   Final    URINE, CLEAN CATCH Performed at [facility], [place] [address], [place], [place] [address]    Special Requests   Final    NONE Performed at [facility], [place] [address], [place], [place] [address]    Culture MULTIPLE SPECIES PRESENT, SUGGEST RECOLLECTION (A)  Final   Report Status 07/23/2019 FINAL  Final  SARS Coronavirus 2 by RT PCR (hospital order, performed in [facility] hospital lab) Nasopharyngeal Nasopharyngeal Swab     Status: None   Collection Time: 07/22/19 11:11 PM   Specimen: Nasopharyngeal Swab  Result Value Ref Range Status   SARS Coronavirus 2 NEGATIVE NEGATIVE Final    Comment: (NOTE) SARS-CoV-2 target nucleic acids are NOT DETECTED. The SARS-CoV-2 RNA is generally detectable in upper and lower respiratory specimens during the acute phase of infection. The lowest concentration of SARS-CoV-2 viral copies this assay can detect is 250 copies / mL. A negative result does not preclude SARS-CoV-2 infection and should not be used as the sole basis for treatment or other patient management decisions.  A negative result may occur with improper specimen collection / handling, submission of specimen other than nasopharyngeal swab, presence of viral mutation(s) within the areas targeted by this assay, and inadequate number of viral  copies (<250 copies / mL). A negative result must be  combined with clinical observations, patient history, and epidemiological information. Fact Sheet for Patients:   StrictlyIdeas.no Fact Sheet for Healthcare Providers: BankingDealers.co.za This test is not yet approved or cleared  by the [place] FDA and has been authorized for detection and/or diagnosis of SARS-CoV-2 by FDA under an Emergency Use Authorization (EUA).  This EUA will remain in effect (meaning this test can be used) for the duration of the COVID-19 declaration under Section 564(b)(1) of the Act, 21 U.S.C. section 360bbb-3(b)(1), unless the authorization is terminated or revoked sooner. Performed at [facility], [place] [address] [place], [place] [address]   MRSA PCR Screening     Status: None   Collection Time: 07/22/19 11:44 PM   Specimen: Nasopharyngeal  Result Value Ref Range Status   MRSA by PCR NEGATIVE NEGATIVE Final    Comment:        The GeneXpert MRSA Assay (FDA approved for NASAL specimens only), is one component of a comprehensive MRSA colonization surveillance program. It is not intended to diagnose MRSA infection nor to guide or monitor treatment for MRSA infections. Performed at [facility], [place] [address] [place], [place] [address]          Radiology Studies: ECHOCARDIOGRAM COMPLETE  Result Date: 07/23/2019    ECHOCARDIOGRAM REPORT   Patient Name:   Deborah [name] Date of Exam: 07/23/2019 Medical Rec #:  [id number]            Height:       66.0 in Accession #:    [id number]           Weight:       320.1 lb Date of Birth:  [birth year]            BSA:          2.442 m Patient Age:    28 years             BP:           123/51 mmHg Patient Gender: F                    HR:           111 bpm. Exam Location:  Inpatient Procedure: 2D Echo, Cardiac Doppler and Color Doppler Indications:    Congestive Heart  Failure 428.0 / I50.9  History:        Patient has prior history of Echocardiogram examinations, most                 recent 01/26/2019. CHF, Stroke and COPD, Arrythmias:Cardiac                 Arrest; Risk Factors:Hypertension, Diabetes and Non-Smoker.                 GERD. Elevated troponin.  Sonographer:    Vickie Epley RDCS Referring Phys: [id number] [place]  1. Left ventricular ejection fraction, by estimation, is 45 to 50%. The left ventricle has mildly decreased function. The left ventricle demonstrates global hypokinesis. Left ventricular diastolic function could not be evaluated.  2. Right ventricular systolic function is normal. The right ventricular size is normal. There is moderately elevated pulmonary artery systolic pressure.  3. The mitral valve is normal in structure. Trivial mitral valve regurgitation. No evidence of mitral stenosis.  4. The aortic valve is tricuspid. Aortic valve regurgitation is not visualized. Mild aortic valve sclerosis is present, with no evidence of aortic valve stenosis.  5. The inferior vena cava is normal in size with greater than 50% respiratory variability, suggesting right atrial pressure of 3 mmHg. FINDINGS  Left Ventricle: Left ventricular ejection fraction, by estimation, is 45 to 50%. The left ventricle has mildly decreased function. The left ventricle demonstrates global hypokinesis. The left ventricular internal cavity size was normal in size. There is  no left ventricular hypertrophy. Left ventricular diastolic function could not be evaluated. Right Ventricle: The right ventricular size is normal. No increase in right ventricular wall thickness. Right ventricular systolic function is normal. There is moderately elevated pulmonary artery systolic pressure. The tricuspid regurgitant velocity is 3.35 m/s, and with an assumed right atrial pressure of 3 mmHg, the estimated right ventricular systolic pressure is 40.8 mmHg. Left Atrium: Left atrial size  was normal in size. Right Atrium: Right atrial size was normal in size. Pericardium: A small pericardial effusion is present. Mitral Valve: The mitral valve is normal in structure. Trivial mitral valve regurgitation. No evidence of mitral valve stenosis. Tricuspid Valve: The tricuspid valve is normal in structure. Tricuspid valve regurgitation is trivial. No evidence of tricuspid stenosis. Aortic Valve: The aortic valve is tricuspid. Aortic valve regurgitation is not visualized. Mild aortic valve sclerosis is present, with no evidence of aortic valve stenosis. There is mild calcification of the aortic valve. Pulmonic Valve: The pulmonic valve was not well visualized. Pulmonic valve regurgitation is not visualized. No evidence of pulmonic stenosis. Aorta: The aortic root, ascending aorta and aortic arch are all structurally normal, with no evidence of dilitation or obstruction. Venous: The inferior vena cava is normal in size with greater than 50% respiratory variability, suggesting right atrial pressure of 3 mmHg. IAS/Shunts: No atrial level shunt detected by color flow Doppler.  LEFT VENTRICLE PLAX 2D LVIDd:         5.50 cm LVIDs:         4.00 cm LV PW:         1.10 cm LV IVS:        1.10 cm LVOT diam:     2.30 cm LV SV:         74 LV SV Index:   30 LVOT Area:     4.15 cm  LV Volumes (MOD) LV vol d, MOD A2C: 175.0 ml LV vol d, MOD A4C: 173.0 ml LV vol s, MOD A2C: 86.2 ml LV vol s, MOD A4C: 91.2 ml LV SV MOD A2C:     88.8 ml LV SV MOD A4C:     173.0 ml LV SV MOD BP:      85.9 ml RIGHT VENTRICLE RV S prime:     14.70 cm/s TAPSE (M-mode): 2.3 cm LEFT ATRIUM             Index       RIGHT ATRIUM           Index LA diam:        4.60 cm 1.88 cm/m  RA Area:     12.00 cm LA Vol (A2C):   49.7 ml 20.35 ml/m RA Volume:   24.60 ml  10.07 ml/m LA Vol (A4C):   53.7 ml 21.99 ml/m LA Biplane Vol: 54.2 ml 22.19 ml/m  AORTIC VALVE LVOT Vmax:   102.00 cm/s LVOT Vmean:  70.100 cm/s LVOT VTI:    0.177 m  AORTA Ao Root diam: 3.40  cm MITRAL VALVE                TRICUSPID VALVE MV Area (  PHT): 5.88 cm     TR Peak grad:   44.9 mmHg MV Decel Time: 129 msec     TR Vmax:        335.00 cm/s MR Peak grad: 87.2 mmHg MR Vmax:      467.00 cm/s   SHUNTS MV E velocity: 158.00 cm/s  Systemic VTI:  0.18 m                             Systemic Diam: 2.30 cm Buford Dresser MD Electronically signed by Buford Dresser MD Signature Date/Time: 07/23/2019/1:59:25 PM    Final         Scheduled Meds: . apixaban  5 mg Oral BID  . Chlorhexidine Gluconate Cloth  6 each Topical Daily  . DULoxetine  40 mg Oral Daily  . hydrALAZINE  5 mg Intravenous Q6H  . insulin aspart  0-15 Units Subcutaneous Q4H  . insulin glargine  35 Units Subcutaneous Daily  . isosorbide mononitrate  60 mg Oral Daily  . metoprolol succinate  25 mg Oral Daily  .  morphine injection  1 mg Intravenous Once  . pantoprazole  40 mg Oral Daily   Continuous Infusions: . sodium chloride 10 mL/hr at 07/23/19 1400  . dextrose 5 % and 0.45% NaCl Stopped (07/23/19 0409)     LOS: 2 days    Time spent:40 min    Ty Buntrock, Geraldo Docker, MD [facility] Pager [phone]  If 7PM-7AM, please contact night-coverage [email or web address] Password [facility] 07/24/2019, 9:43 AM

## 2019-07-24 NOTE — Progress Notes (Signed)
[facility] Gastroenterology Progress Note  Deborah [name] 54 y.o. 12/27/1965  CC:  Abdominal pain, nausea/vomiting  Subjective: Patient reports feeling better today.  She tolerated her dinner last night which was a cheeseburger.  She has not had any vomiting today.  Notes continued diffuse abdominal pain.  Has not had a bowel movement yet today.  Gastric emptying study was not completed today due to ongoing use of morphine for pain.  ROS : Review of Systems  Cardiovascular: Negative for chest pain and palpitations.  Gastrointestinal: Positive for abdominal pain. Negative for blood in stool, constipation, diarrhea, heartburn, melena, nausea and vomiting.   Objective: Vital signs in last 24 hours: Vitals:   07/24/19 0542 07/24/19 0800  BP: (!) 118/51 136/70  Pulse:  (!) 102  Resp:  (!) 5  Temp:  98.2 F (36.8 C)  SpO2:  95%    Physical Exam:  General:  Lethargic, obese, cooperative, no distress  Head:  Normocephalic, without obvious abnormality, atraumatic  Eyes:  Anicteric sclera, EOM's intact  Lungs:   Clear to auscultation bilaterally, respirations unlabored  Heart:  Mildly tachycardic with regular rhythm, S1, S2 normal  Abdomen:   Soft, non-tender, mildly sluggish bowel sounds,  no guarding or peritoneal signs  Extremities: Extremities normal, atraumatic, no  edema  Pulses: 2+ and symmetric    Lab Results: Recent Labs    07/22/19 1905 07/22/19 1905 07/23/19 0122 07/24/19 0253  NA 139   < > 137 138  K 3.6   < > 3.9 3.6  CL 99   < > 103 102  CO2 27   < > 22 27  GLUCOSE 165*   < > 169* 193*  BUN 19   < > 16 14  CREATININE 1.86*   < > 1.54* 1.51*  CALCIUM 8.4*   < > 8.0* 8.2*  MG 1.5*  --   --  1.7  PHOS 3.1  --   --  3.1   < > = values in this interval not displayed.   Recent Labs    07/23/19 0122 07/24/19 0253  AST 11* 11*  ALT 11 8  ALKPHOS 81 79  BILITOT 0.6 0.8  PROT 6.6 6.4*  ALBUMIN 3.1* 3.0*   Recent Labs    07/22/19 2016 07/22/19 2016  07/23/19 0122 07/24/19 0253  WBC 18.1*   < > 16.4* 15.2*  NEUTROABS 14.7*  --   --   --   HGB 9.0*   < > 8.5* 7.8*  HCT 30.0*   < > 27.5* 26.8*  MCV 89.8   < > 87.0 91.5  PLT 482*   < > 318 386   < > = values in this interval not displayed.   No results for input(s): LABPROT, INR in the last 72 hours.  Assessment: Intractable nausea/vomiting, suspect this is related to diabetes; however, gastric emptying studies in 2017 and 2018 were normal.Blood glucose remains elevated.  CKD III: BUN 14/ Cr 1.51  Chronic normocytic anemia, stable, Hgb7.8today, decreased from 8.5 yesterday, sus[pect this is dilutional. No signs of GI bleeding.  Leukocytosis, WBCs 16.4 today  Plan: Restart Erythromycin.  OK to restart short-term Reglan if nausea/vomiting despite erythromycin.  ContinueZofran 4mg  q6h PRN as needed for nausea and vomiting.  Continuelow residue diet with small portions.  Blood glucose control.  Avoid narcotic use.  Discussed with Dr. Sherral Hammers.  Morphine discontinued.  Will hold any narcotics as gastric emptying scan cannot be completed for 2 days after narcotic use.  Plan for Gastric emptying study 5/20.  [facility] will follow.  Salley Slaughter PA-C 07/24/2019, 11:32 AM  Contact #  [phone]

## 2019-07-25 LAB — GLUCOSE, CAPILLARY
Glucose-Capillary: 143 mg/dL — ABNORMAL HIGH (ref 70–99)
Glucose-Capillary: 197 mg/dL — ABNORMAL HIGH (ref 70–99)
Glucose-Capillary: 240 mg/dL — ABNORMAL HIGH (ref 70–99)
Glucose-Capillary: 269 mg/dL — ABNORMAL HIGH (ref 70–99)
Glucose-Capillary: 273 mg/dL — ABNORMAL HIGH (ref 70–99)
Glucose-Capillary: 284 mg/dL — ABNORMAL HIGH (ref 70–99)
Glucose-Capillary: 339 mg/dL — ABNORMAL HIGH (ref 70–99)
Glucose-Capillary: 365 mg/dL — ABNORMAL HIGH (ref 70–99)

## 2019-07-25 LAB — COMPREHENSIVE METABOLIC PANEL
ALT: 9 U/L (ref 0–44)
AST: 12 U/L — ABNORMAL LOW (ref 15–41)
Albumin: 3.1 g/dL — ABNORMAL LOW (ref 3.5–5.0)
Alkaline Phosphatase: 91 U/L (ref 38–126)
Anion gap: 14 (ref 5–15)
BUN: 21 mg/dL — ABNORMAL HIGH (ref 6–20)
CO2: 22 mmol/L (ref 22–32)
Calcium: 8.8 mg/dL — ABNORMAL LOW (ref 8.9–10.3)
Chloride: 99 mmol/L (ref 98–111)
Creatinine, Ser: 2.1 mg/dL — ABNORMAL HIGH (ref 0.44–1.00)
GFR calc Af Amer: 30 mL/min — ABNORMAL LOW (ref >60)
GFR calc non Af Amer: 26 mL/min — ABNORMAL LOW (ref >60)
Glucose, Bld: 297 mg/dL — ABNORMAL HIGH (ref 70–99)
Potassium: 3.2 mmol/L — ABNORMAL LOW (ref 3.5–5.1)
Sodium: 135 mmol/L (ref 135–145)
Total Bilirubin: 0.5 mg/dL (ref 0.3–1.2)
Total Protein: 6.5 g/dL (ref 6.5–8.1)

## 2019-07-25 LAB — PROCALCITONIN: Procalcitonin: <0.1 ng/mL

## 2019-07-25 LAB — CBC WITH DIFFERENTIAL/PLATELET
Abs Immature Granulocytes: 0.06 10*3/uL (ref 0.00–0.07)
Basophils Absolute: 0.1 10*3/uL (ref 0.0–0.1)
Basophils Relative: 0 %
Eosinophils Absolute: 0.3 10*3/uL (ref 0.0–0.5)
Eosinophils Relative: 2 %
HCT: 26.4 % — ABNORMAL LOW (ref 36.0–46.0)
Hemoglobin: 7.7 g/dL — ABNORMAL LOW (ref 12.0–15.0)
Immature Granulocytes: 1 %
Lymphocytes Relative: 24 %
Lymphs Abs: 3.1 10*3/uL (ref 0.7–4.0)
MCH: 26.4 pg (ref 26.0–34.0)
MCHC: 29.2 g/dL — ABNORMAL LOW (ref 30.0–36.0)
MCV: 90.4 fL (ref 80.0–100.0)
Monocytes Absolute: 1 10*3/uL (ref 0.1–1.0)
Monocytes Relative: 8 %
Neutro Abs: 8.5 10*3/uL — ABNORMAL HIGH (ref 1.7–7.7)
Neutrophils Relative %: 65 %
Platelets: 410 10*3/uL — ABNORMAL HIGH (ref 150–400)
RBC: 2.92 MIL/uL — ABNORMAL LOW (ref 3.87–5.11)
RDW: 15.5 % (ref 11.5–15.5)
WBC: 13 10*3/uL — ABNORMAL HIGH (ref 4.0–10.5)
nRBC: 0 % (ref 0.0–0.2)

## 2019-07-25 LAB — MAGNESIUM: Magnesium: 1.8 mg/dL (ref 1.7–2.4)

## 2019-07-25 LAB — PHOSPHORUS: Phosphorus: 2.5 mg/dL (ref 2.5–4.6)

## 2019-07-25 MED ORDER — POTASSIUM CHLORIDE CRYS ER 20 MEQ PO TBCR
40.0000 meq | EXTENDED_RELEASE_TABLET | Freq: Once | ORAL | Status: AC
  Administered 2019-07-25: 40 meq via ORAL
  Filled 2019-07-25: qty 2

## 2019-07-25 MED ORDER — POTASSIUM CHLORIDE 10 MEQ/100ML IV SOLN
10.0000 meq | INTRAVENOUS | Status: AC
  Administered 2019-07-25: 10 meq via INTRAVENOUS
  Filled 2019-07-25: qty 100

## 2019-07-25 MED ORDER — INSULIN ASPART 100 UNIT/ML ~~LOC~~ SOLN
5.0000 [IU] | Freq: Three times a day (TID) | SUBCUTANEOUS | Status: DC
  Administered 2019-07-25 – 2019-08-05 (×27): 5 [IU] via SUBCUTANEOUS

## 2019-07-25 MED ORDER — SODIUM CHLORIDE 0.9 % IV SOLN: INTRAVENOUS | Status: DC

## 2019-07-25 NOTE — Progress Notes (Signed)
Inpatient Diabetes Program Recommendations  AACE/ADA: New Consensus Statement on Inpatient Glycemic Control (2015)  Target Ranges:  Prepandial:   less than 140 mg/dL      Peak postprandial:   less than 180 mg/dL (1-2 hours)      Critically ill patients:  140 - 180 mg/dL   Lab Results  Component Value Date   GLUCAP 365 (H) 07/25/2019   HGBA1C 8.7 (H) 07/22/2019    Review of Glycemic Control  Eating 100%. Post-prandial blood sugars elevated. Needs meal coverage insulin.  Inpatient Diabetes Program Recommendations:     Add Novolog 5 units tidwc for meal coverage insulin if pt eats > 50% meal. Hold if pt is NPO.  Follow closely.  Thank you. Lorenda Peck, RD, LDN, CDE Inpatient Diabetes Coordinator [phone]

## 2019-07-25 NOTE — Progress Notes (Signed)
PROGRESS NOTE    Deborah [name]  RXY:[id number] DOB: Feb 03, 1966 DOA: 07/21/2019 PCP: [name], DO   Brief Narrative: Patient is a 54 year old female with past medical history of depression, nonhemorrhagic CVA, chronic systolic congestive heart failure, paroxysmal A. fib on Eliquis, diabetes type 2, recurrent admission for DKA, diabetic gastroparesis, CKD stage IIIa, hypertension, gout, gastric ulcer, GERD, anemia of chronic disease who presented to the emergency room with complaints of nausea, vomiting, diffuse abdominal pain.  She was recently admitted here for the management of exacerbation of gastroparesis earlier this month.  GI has been consulted and following.  Plan for gastric emptying test.  On presentation she was tachycardic, tachypneic with lactic acidosis. Abdomen pain has significantly improved today.  Plan for gastric emptying study tomorrow  Assessment & Plan:   Active Problems:   Essential hypertension, benign   Uncontrolled type 2 diabetes mellitus with diabetic neuropathy, with long-term current use of insulin (HCC)   CKD (chronic kidney disease), stage II   Diabetic gastroparesis (HCC)   Morbid obesity (HCC)   Anxiety   Intractable nausea and vomiting   Chronic systolic CHF (congestive heart failure) ([place])   Uncontrolled type 2 diabetes mellitus with hyperglycemia ([place])   Hyperosmolar hyperglycemic state (HHS) ([place])   Drug-seeking behavior   Suspected sepsis: Sepsis ruled out.  Antibiotics discontinued.  She was tachypneic, tachycardic with lactic acidosis on presentation.  Elevated white cell count on presentation.  Lactate is normalized.  Procalcitonin negative.  Abdominal pain/gastroparesis: Most likely secondary to her uncontrolled diabetes.  She has been admitted multiple times for the same problem.  On erythromycin 3 times daily.  She has history of noncompliance.    GI consulted and planning for gastric emptying test tomorrow.  Chronic systolic  CHF: Continue input/output monitoring, daily weight.  She has history of noncompliance.  She has not been following with her cardiologist.  Case was discussed with cardiology and she has been arranging outpatient follow-up.  Currently on Toprol, hydralazine.  Echo showed ejection fraction 45 to 50%, global hypokinesis, moderately elevated pulmonary artery systolic pressure.  The findings are similar to the previous echo on 11/20. She looks mildly dehydrated today, will start on gentle IV fluids.  Hypertension: Currently blood pressure stable.  Continue current medications  Paroxysmal  A. fib: On Eliquis for anticoagulation.  On Toprol for rate control.  AKI on CKD stage IIIa: Baseline creatinine 1.3.  Creatinine up to the level of 2.1 today.  We will continue IV fluids.Check Bmp tomorrow  Type 2 diabetes mellitus: Recent hemoglobin A1c of 8.4.  Continue Lantus and sliding cell insulin along with NovoLog.  Diabetic coordinator  has been following.  History of nonhemorrhagic CVA: Continue current medications.  Hyperlipidemia: On statin at home.  Morbid obesity: BMI 51.6.  Hypokalemia: We will supplement with potassium  Normocytic anemia: Hemoglobin of 7.7.  We will check iron studies.  No evidence of acute blood loss.  Could be assured with her CKD.  Tardive dyskinesia: Has involuntary movement of her tongue and mouth consistent with tardive dyskinesia.  Could be associated with Reglan that she was using in the past.  Continue supportive care         DVT prophylaxis:Eliquis Code Status: Full Family Communication: None present at the bedside Status is: Inpatient  Remains inpatient appropriate because:Ongoing diagnostic testing needed not appropriate for outpatient work up   Dispo: The patient is from: Home              Anticipated  d/c is to: Home              Anticipated d/c date is: 1 day              Patient currently is not medically stable to d/c.    Consultants:  GI  Procedures:None  Antimicrobials:  Anti-infectives (From admission, onward)   Start     Dose/Rate Route Frequency Ordered Stop   07/24/19 1700  erythromycin (E-MYCIN) tablet 250 mg     250 mg Oral 3 times daily with meals & bedtime 07/24/19 1205     07/22/19 0800  erythromycin (E-MYCIN) tablet 250 mg  Status:  Discontinued     250 mg Oral 3 times daily with meals & bedtime 07/22/19 0723 07/23/19 1507   07/22/19 0800  ciprofloxacin (CIPRO) IVPB 400 mg  Status:  Discontinued     400 mg 200 mL/hr over 60 Minutes Intravenous Every 12 hours 07/22/19 0734 07/23/19 0833   07/22/19 0800  metroNIDAZOLE (FLAGYL) IVPB 500 mg  Status:  Discontinued     500 mg 100 mL/hr over 60 Minutes Intravenous Every 8 hours 07/22/19 0734 07/23/19 0833      Subjective: Patient seen and examined at the bedside this morning.  Hemodynamically stable.  Sitting in the chair.  Abdomen pain, nausea and vomiting have significantly improved today.  Objective: Vitals:   07/25/19 0500 07/25/19 0600 07/25/19 0625 07/25/19 0800  BP:   (!) 121/52   Pulse: 95 92 94   Resp: 19 (!) 28 10   Temp:    97.8 F (36.6 C)  TempSrc:    Oral  SpO2: 99% 94% 94%   Weight:      Height:        Intake/Output Summary (Last 24 hours) at 07/25/2019 0806 Last data filed at 07/24/2019 2345 Gross per 24 hour  Intake 2322.31 ml  Output --  Net 2322.31 ml   Filed Weights   07/23/19 0500 07/24/19 0600  Weight: (!) 145.2 kg (!) 145.2 kg    Examination:  General exam: Not in distress, morbidly obese HEENT:PERRL,Oral mucosa dry, Ear/Nose normal on gross exam, involuntary movement of the mouth/tongue Respiratory system: Bilateral equal air entry, normal vesicular breath sounds, no wheezes or crackles  Cardiovascular system: S1 & S2 heard, RRR. No JVD, murmurs, rubs, gallops or clicks. No pedal edema. Gastrointestinal system: Abdomen is nondistended, soft and nontender. No organomegaly or masses felt. Normal bowel sounds  heard. Central nervous system: Alert and oriented. No focal neurological deficits. Extremities: 1-2+ pedal  edema, no clubbing ,no cyanosis, distal peripheral pulses palpable. Skin: No rashes, lesions or ulcers,no icterus ,no pallor  Data Reviewed: I have personally reviewed following labs and imaging studies  CBC: Recent Labs  Lab 07/21/19 2005 07/22/19 2016 07/23/19 0122 07/24/19 0253 07/25/19 0204  WBC 17.6* 18.1* 16.4* 15.2* 13.0*  NEUTROABS  --  14.7*  --   --  8.5*  HGB 9.2* 9.0* 8.5* 7.8* 7.7*  HCT 30.0* 30.0* 27.5* 26.8* 26.4*  MCV 87.7 89.8 87.0 91.5 90.4  PLT 560* 482* 318 386 662*   Basic Metabolic Panel: Recent Labs  Lab 07/22/19 1529 07/22/19 1905 07/23/19 0122 07/24/19 0253 07/25/19 0204  NA 140 139 137 138 135  K 3.2* 3.6 3.9 3.6 3.2*  CL 102 99 103 102 99  CO2 30 27 22 27 22   GLUCOSE 200* 165* 169* 193* 297*  BUN 18 19 16 14  21*  CREATININE 1.54* 1.86* 1.54* 1.51* 2.10*  CALCIUM 8.4*  8.4* 8.0* 8.2* 8.8*  MG  --  1.5*  --  1.7 1.8  PHOS  --  3.1  --  3.1 2.5   GFR: Estimated Creatinine Clearance: 44.8 mL/min (A) (by C-G formula based on SCr of 2.1 mg/dL (H)). Liver Function Tests: Recent Labs  Lab 07/21/19 2335 07/23/19 0122 07/24/19 0253 07/25/19 0204  AST 21 11* 11* 12*  ALT 13 11 8 9   ALKPHOS 101 81 79 91  BILITOT 1.0 0.6 0.8 0.5  PROT 7.8 6.6 6.4* 6.5  ALBUMIN 3.6 3.1* 3.0* 3.1*   Recent Labs  Lab 07/21/19 2335  LIPASE 23   No results for input(s): AMMONIA in the last 168 hours. Coagulation Profile: No results for input(s): INR, PROTIME in the last 168 hours. Cardiac Enzymes: No results for input(s): CKTOTAL, CKMB, CKMBINDEX, TROPONINI in the last 168 hours. BNP (last 3 results) No results for input(s): PROBNP in the last 8760 hours. HbA1C: Recent Labs    07/22/19 1529  HGBA1C 8.7*   CBG: Recent Labs  Lab 07/24/19 1618 07/24/19 2345 07/25/19 0112 07/25/19 0327 07/25/19 0753  GLUCAP 379* 287* 273* 240* 143*   Lipid  Profile: Recent Labs    07/22/19 1529  CHOL 104  HDL 30*  LDLCALC 37  TRIG 185*  CHOLHDL 3.5   Thyroid Function Tests: Recent Labs    07/22/19 1529  TSH 2.576   Anemia Panel: No results for input(s): VITAMINB12, FOLATE, FERRITIN, TIBC, IRON, RETICCTPCT in the last 72 hours. Sepsis Labs: Recent Labs  Lab 07/22/19 1906 07/23/19 0615 07/24/19 1004 07/24/19 1354 07/25/19 0204  PROCALCITON  --   --  <0.10  --  <0.10  LATICACIDVEN 3.2* 0.9 0.9 2.5*  --     Recent Results (from the past 240 hour(s))  Urine culture     Status: Abnormal   Collection Time: 07/21/19 11:36 PM   Specimen: Urine, Clean Catch  Result Value Ref Range Status   Specimen Description   Final    URINE, CLEAN CATCH Performed at [facility], [place] [address], [place], [place] [address]    Special Requests   Final    NONE Performed at [facility], [place] [address], [place], [place] [address]    Culture MULTIPLE SPECIES PRESENT, SUGGEST RECOLLECTION (A)  Final   Report Status 07/23/2019 FINAL  Final  SARS Coronavirus 2 by RT PCR (hospital order, performed in [facility] hospital lab) Nasopharyngeal Nasopharyngeal Swab     Status: None   Collection Time: 07/22/19 11:11 PM   Specimen: Nasopharyngeal Swab  Result Value Ref Range Status   SARS Coronavirus 2 NEGATIVE NEGATIVE Final    Comment: (NOTE) SARS-CoV-2 target nucleic acids are NOT DETECTED. The SARS-CoV-2 RNA is generally detectable in upper and lower respiratory specimens during the acute phase of infection. The lowest concentration of SARS-CoV-2 viral copies this assay can detect is 250 copies / mL. A negative result does not preclude SARS-CoV-2 infection and should not be used as the sole basis for treatment or other patient management decisions.  A negative result may occur with improper specimen collection / handling, submission of specimen other than nasopharyngeal swab, presence of viral mutation(s)  within the areas targeted by this assay, and inadequate number of viral copies (<250 copies / mL). A negative result must be combined with clinical observations, patient history, and epidemiological information. Fact Sheet for Patients:   StrictlyIdeas.no Fact Sheet for Healthcare Providers: BankingDealers.co.za This test is not yet approved or cleared  by the [place] and has been authorized for detection and/or diagnosis of SARS-CoV-2 by FDA under an Emergency Use Authorization (EUA).  This EUA will remain in effect (meaning this test can be used) for the duration of the COVID-19 declaration under Section 564(b)(1) of the Act, 21 U.S.C. section 360bbb-3(b)(1), unless the authorization is terminated or revoked sooner. Performed at [facility], [place] [address] [place], [place] [address]   MRSA PCR Screening     Status: None   Collection Time: 07/22/19 11:44 PM   Specimen: Nasopharyngeal  Result Value Ref Range Status   MRSA by PCR NEGATIVE NEGATIVE Final    Comment:        The GeneXpert MRSA Assay (FDA approved for NASAL specimens only), is one component of a comprehensive MRSA colonization surveillance program. It is not intended to diagnose MRSA infection nor to guide or monitor treatment for MRSA infections. Performed at [facility], [place] [address] [place], [place] [address]          Radiology Studies: ECHOCARDIOGRAM COMPLETE  Result Date: 07/23/2019    ECHOCARDIOGRAM REPORT   Patient Name:   CELSA NORDAHL Date of Exam: 07/23/2019 Medical Rec #:  [id number]            Height:       66.0 in Accession #:    [id number]           Weight:       320.1 lb Date of Birth:  10-05-1965            BSA:          2.442 m Patient Age:    59 years             BP:           123/51 mmHg Patient Gender: F                    HR:           111 bpm. Exam Location:  Inpatient Procedure: 2D  Echo, Cardiac Doppler and Color Doppler Indications:    Congestive Heart Failure 428.0 / I50.9  History:        Patient has prior history of Echocardiogram examinations, most                 recent 01/26/2019. CHF, Stroke and COPD, Arrythmias:Cardiac                 Arrest; Risk Factors:Hypertension, Diabetes and Non-Smoker.                 GERD. Elevated troponin.  Sonographer:    Vickie Epley RDCS Referring Phys: [id number] [place]  1. Left ventricular ejection fraction, by estimation, is 45 to 50%. The left ventricle has mildly decreased function. The left ventricle demonstrates global hypokinesis. Left ventricular diastolic function could not be evaluated.  2. Right ventricular systolic function is normal. The right ventricular size is normal. There is moderately elevated pulmonary artery systolic pressure.  3. The mitral valve is normal in structure. Trivial mitral valve regurgitation. No evidence of mitral stenosis.  4. The aortic valve is tricuspid. Aortic valve regurgitation is not visualized. Mild aortic valve sclerosis is present, with no evidence of aortic valve stenosis.  5. The inferior vena cava is normal in size with greater than 50% respiratory variability, suggesting right atrial pressure of 3 mmHg. FINDINGS  Left Ventricle: Left ventricular ejection fraction,  by estimation, is 45 to 50%. The left ventricle has mildly decreased function. The left ventricle demonstrates global hypokinesis. The left ventricular internal cavity size was normal in size. There is  no left ventricular hypertrophy. Left ventricular diastolic function could not be evaluated. Right Ventricle: The right ventricular size is normal. No increase in right ventricular wall thickness. Right ventricular systolic function is normal. There is moderately elevated pulmonary artery systolic pressure. The tricuspid regurgitant velocity is 3.35 m/s, and with an assumed right atrial pressure of 3 mmHg, the estimated right  ventricular systolic pressure is 36.1 mmHg. Left Atrium: Left atrial size was normal in size. Right Atrium: Right atrial size was normal in size. Pericardium: A small pericardial effusion is present. Mitral Valve: The mitral valve is normal in structure. Trivial mitral valve regurgitation. No evidence of mitral valve stenosis. Tricuspid Valve: The tricuspid valve is normal in structure. Tricuspid valve regurgitation is trivial. No evidence of tricuspid stenosis. Aortic Valve: The aortic valve is tricuspid. Aortic valve regurgitation is not visualized. Mild aortic valve sclerosis is present, with no evidence of aortic valve stenosis. There is mild calcification of the aortic valve. Pulmonic Valve: The pulmonic valve was not well visualized. Pulmonic valve regurgitation is not visualized. No evidence of pulmonic stenosis. Aorta: The aortic root, ascending aorta and aortic arch are all structurally normal, with no evidence of dilitation or obstruction. Venous: The inferior vena cava is normal in size with greater than 50% respiratory variability, suggesting right atrial pressure of 3 mmHg. IAS/Shunts: No atrial level shunt detected by color flow Doppler.  LEFT VENTRICLE PLAX 2D LVIDd:         5.50 cm LVIDs:         4.00 cm LV PW:         1.10 cm LV IVS:        1.10 cm LVOT diam:     2.30 cm LV SV:         74 LV SV Index:   30 LVOT Area:     4.15 cm  LV Volumes (MOD) LV vol d, MOD A2C: 175.0 ml LV vol d, MOD A4C: 173.0 ml LV vol s, MOD A2C: 86.2 ml LV vol s, MOD A4C: 91.2 ml LV SV MOD A2C:     88.8 ml LV SV MOD A4C:     173.0 ml LV SV MOD BP:      85.9 ml RIGHT VENTRICLE RV S prime:     14.70 cm/s TAPSE (M-mode): 2.3 cm LEFT ATRIUM             Index       RIGHT ATRIUM           Index LA diam:        4.60 cm 1.88 cm/m  RA Area:     12.00 cm LA Vol (A2C):   49.7 ml 20.35 ml/m RA Volume:   24.60 ml  10.07 ml/m LA Vol (A4C):   53.7 ml 21.99 ml/m LA Biplane Vol: 54.2 ml 22.19 ml/m  AORTIC VALVE LVOT Vmax:   102.00  cm/s LVOT Vmean:  70.100 cm/s LVOT VTI:    0.177 m  AORTA Ao Root diam: 3.40 cm MITRAL VALVE                TRICUSPID VALVE MV Area (PHT): 5.88 cm     TR Peak grad:   44.9 mmHg MV Decel Time: 129 msec     TR Vmax:  335.00 cm/s MR Peak grad: 87.2 mmHg MR Vmax:      467.00 cm/s   SHUNTS MV E velocity: 158.00 cm/s  Systemic VTI:  0.18 m                             Systemic Diam: 2.30 cm Buford Dresser MD Electronically signed by Buford Dresser MD Signature Date/Time: 07/23/2019/1:59:25 PM    Final         Scheduled Meds: . apixaban  5 mg Oral BID  . Chlorhexidine Gluconate Cloth  6 each Topical Daily  . DULoxetine  40 mg Oral Daily  . erythromycin  250 mg Oral TID WC & HS  . hydrALAZINE  5 mg Intravenous Q6H  . insulin aspart  0-15 Units Subcutaneous Q4H  . insulin glargine  35 Units Subcutaneous Daily  . isosorbide mononitrate  60 mg Oral Daily  . metoprolol succinate  50 mg Oral Daily  . pantoprazole  40 mg Oral Daily   Continuous Infusions: . sodium chloride 10 mL/hr at 07/23/19 1400     LOS: 3 days    Time spent:25 mins. More than 50% of that time was spent in counseling and/or coordination of care.      Shelly Coss, MD [facility] P5/19/2021, 8:06 AM

## 2019-07-25 NOTE — Progress Notes (Addendum)
[facility] Progress Note  Deborah [name] 54 y.o. 09-Mar-1965  CC:  Nausea/vomiting  Subjective: Patient reports feeling better today.  Reports mild diffuse abdominal pain.  She has not had any emesis since Monday 5/17.  She had several episodes of diarrhea yesterday but denies any melenic or bloody stools.  She ate chicken for dinner and had mild nausea but no vomiting.  Reports feeling thirsty.  ROS : Review of Systems  Cardiovascular: Negative for chest pain and palpitations.  Gastrointestinal: Positive for abdominal pain, diarrhea and nausea. Negative for blood in stool, constipation, heartburn, melena and vomiting.    Objective: Vital signs in last 24 hours: Vitals:   07/25/19 0625 07/25/19 0800  BP: (!) 121/52   Pulse: 94   Resp: 10   Temp:  97.8 F (36.6 C)  SpO2: 94%     Physical Exam:  General:  Alert, cooperative, no distress, sitting comfortably in chair, obese  Head:  Normocephalic, without obvious abnormality, atraumatic  Eyes:  Anicteric sclera, EOMs intact,   Lungs:   Clear to auscultation bilaterally, respirations unlabored  Heart:  Mildly tachycardic with regular rhythm, S1/S2 normal  Abdomen:   Soft, mild diffuse tenderness, mildly sluggish bowel sounds,  no guarding or peritoneal signs.  Extremities: Extremities normal, atraumatic, no  edema  Pulses: 2+ and symmetric    Lab Results: Recent Labs    07/24/19 0253 07/25/19 0204  NA 138 135  K 3.6 3.2*  CL 102 99  CO2 27 22  GLUCOSE 193* 297*  BUN 14 21*  CREATININE 1.51* 2.10*  CALCIUM 8.2* 8.8*  MG 1.7 1.8  PHOS 3.1 2.5   Recent Labs    07/24/19 0253 07/25/19 0204  AST 11* 12*  ALT 8 9  ALKPHOS 79 91  BILITOT 0.8 0.5  PROT 6.4* 6.5  ALBUMIN 3.0* 3.1*   Recent Labs    07/22/19 2016 07/23/19 0122 07/24/19 0253 07/25/19 0204  WBC 18.1*   < > 15.2* 13.0*  NEUTROABS 14.7*  --   --  8.5*  HGB 9.0*   < > 7.8* 7.7*  HCT 30.0*   < > 26.8* 26.4*  MCV 89.8   < > 91.5 90.4   PLT 482*   < > 386 410*   < > = values in this interval not displayed.   No results for input(s): LABPROT, INR in the last 72 hours.  Assessment: Intractable nausea/vomiting, suspect this is related to diabetes; however, gastric emptying studies in 2017 and 2018 were normal.Blood glucose remains elevated. Mental status and clinical status much improved after discontinuation of narcotics.   Acute on chronic kidney disease: Cr 2.10, increased from 1.51 yesterday.  BUN 21, increased from 14 yesterday.  Chronic normocytic anemia, stable, Hgb7.7today. No signs of GI bleeding.  Leukocytosis, improving WBCs 13.0 today  Plan: Gastric emptying study tomorrow 5/20 (NPO after midnight) HOLD REGLAN AND ERYTHROMYCIN until after gastric emptying study tomorrow.  Continue to hold narcotics.  ContinueZofran 4mg  q6h PRNas needed for nausea and vomiting.  Continuelow residue diet with small portions.  Recommend social work consultation   Blood glucose control.  [facility] willfollow.  Salley Slaughter PA-C 07/25/2019, 8:38 AM  Contact #  [phone]

## 2019-07-26 ENCOUNTER — Inpatient Hospital Stay (HOSPITAL_COMMUNITY): Payer: Medicare Other

## 2019-07-26 LAB — COMPREHENSIVE METABOLIC PANEL
ALT: 9 U/L (ref 0–44)
AST: 12 U/L — ABNORMAL LOW (ref 15–41)
Albumin: 2.9 g/dL — ABNORMAL LOW (ref 3.5–5.0)
Alkaline Phosphatase: 85 U/L (ref 38–126)
Anion gap: 8 (ref 5–15)
BUN: 23 mg/dL — ABNORMAL HIGH (ref 6–20)
CO2: 26 mmol/L (ref 22–32)
Calcium: 8.4 mg/dL — ABNORMAL LOW (ref 8.9–10.3)
Chloride: 100 mmol/L (ref 98–111)
Creatinine, Ser: 1.71 mg/dL — ABNORMAL HIGH (ref 0.44–1.00)
GFR calc Af Amer: 38 mL/min — ABNORMAL LOW (ref >60)
GFR calc non Af Amer: 33 mL/min — ABNORMAL LOW (ref >60)
Glucose, Bld: 165 mg/dL — ABNORMAL HIGH (ref 70–99)
Potassium: 3.3 mmol/L — ABNORMAL LOW (ref 3.5–5.1)
Sodium: 134 mmol/L — ABNORMAL LOW (ref 135–145)
Total Bilirubin: 0.5 mg/dL (ref 0.3–1.2)
Total Protein: 6.3 g/dL — ABNORMAL LOW (ref 6.5–8.1)

## 2019-07-26 LAB — GLUCOSE, CAPILLARY
Glucose-Capillary: 155 mg/dL — ABNORMAL HIGH (ref 70–99)
Glucose-Capillary: 167 mg/dL — ABNORMAL HIGH (ref 70–99)
Glucose-Capillary: 194 mg/dL — ABNORMAL HIGH (ref 70–99)
Glucose-Capillary: 288 mg/dL — ABNORMAL HIGH (ref 70–99)
Glucose-Capillary: 331 mg/dL — ABNORMAL HIGH (ref 70–99)
Glucose-Capillary: 192 mg/dL — ABNORMAL HIGH (ref 70–99)

## 2019-07-26 LAB — CBC WITH DIFFERENTIAL/PLATELET
Abs Immature Granulocytes: 0.04 10*3/uL (ref 0.00–0.07)
Basophils Absolute: 0.1 10*3/uL (ref 0.0–0.1)
Basophils Relative: 1 %
Eosinophils Absolute: 0.4 10*3/uL (ref 0.0–0.5)
Eosinophils Relative: 3 %
HCT: 23.1 % — ABNORMAL LOW (ref 36.0–46.0)
Hemoglobin: 7.1 g/dL — ABNORMAL LOW (ref 12.0–15.0)
Immature Granulocytes: 0 %
Lymphocytes Relative: 26 %
Lymphs Abs: 3 10*3/uL (ref 0.7–4.0)
MCH: 27.3 pg (ref 26.0–34.0)
MCHC: 30.7 g/dL (ref 30.0–36.0)
MCV: 88.8 fL (ref 80.0–100.0)
Monocytes Absolute: 0.8 10*3/uL (ref 0.1–1.0)
Monocytes Relative: 7 %
Neutro Abs: 7.2 10*3/uL (ref 1.7–7.7)
Neutrophils Relative %: 63 %
Platelets: 394 10*3/uL (ref 150–400)
RBC: 2.6 MIL/uL — ABNORMAL LOW (ref 3.87–5.11)
RDW: 15.4 % (ref 11.5–15.5)
WBC: 11.4 10*3/uL — ABNORMAL HIGH (ref 4.0–10.5)
nRBC: 0 % (ref 0.0–0.2)

## 2019-07-26 LAB — IRON AND TIBC
Iron: 31 ug/dL (ref 28–170)
Saturation Ratios: 8 % — ABNORMAL LOW (ref 10.4–31.8)
TIBC: 372 ug/dL (ref 250–450)
UIBC: 341 ug/dL

## 2019-07-26 LAB — FERRITIN: Ferritin: 53 ng/mL (ref 11–307)

## 2019-07-26 LAB — ABO/RH: ABO/RH(D): O POS

## 2019-07-26 LAB — PREPARE RBC (CROSSMATCH)

## 2019-07-26 MED ORDER — ERYTHROMYCIN BASE 250 MG PO TABS
250.0000 mg | ORAL_TABLET | Freq: Three times a day (TID) | ORAL | Status: DC
  Administered 2019-07-26 – 2019-08-09 (×50): 250 mg via ORAL
  Filled 2019-07-26 (×58): qty 1

## 2019-07-26 MED ORDER — TECHNETIUM TC 99M SULFUR COLLOID
1.9000 | Freq: Once | INTRAVENOUS | Status: AC | PRN
  Administered 2019-07-26: 1.9 via INTRAVENOUS

## 2019-07-26 MED ORDER — SODIUM CHLORIDE 0.9% FLUSH
10.0000 mL | INTRAVENOUS | Status: DC | PRN
  Administered 2019-07-28 – 2019-08-08 (×3): 10 mL

## 2019-07-26 MED ORDER — POTASSIUM CHLORIDE 10 MEQ/100ML IV SOLN: 10.0000 meq | INTRAVENOUS | Status: DC

## 2019-07-26 MED ORDER — POTASSIUM CHLORIDE 10 MEQ/100ML IV SOLN
10.0000 meq | INTRAVENOUS | Status: AC
  Administered 2019-07-26 (×2): 10 meq via INTRAVENOUS
  Filled 2019-07-26: qty 100

## 2019-07-26 MED ORDER — LORAZEPAM 0.5 MG PO TABS
0.5000 mg | ORAL_TABLET | Freq: Four times a day (QID) | ORAL | Status: DC | PRN
  Administered 2019-07-26 – 2019-08-09 (×25): 0.5 mg via ORAL
  Filled 2019-07-26 (×25): qty 1

## 2019-07-26 MED ORDER — SODIUM CHLORIDE 0.9% IV SOLUTION: Freq: Once | INTRAVENOUS | Status: AC

## 2019-07-26 MED ORDER — POTASSIUM CHLORIDE 10 MEQ/100ML IV SOLN
10.0000 meq | INTRAVENOUS | Status: AC
  Administered 2019-07-26 (×2): 10 meq via INTRAVENOUS
  Filled 2019-07-26 (×2): qty 100

## 2019-07-26 NOTE — Progress Notes (Signed)
Patient has ben informed of transferred. Report has been called to [place]. PT 2000 CBG was covered before transferred by this nurse.

## 2019-07-26 NOTE — TOC Progression Note (Signed)
Transition of Care [facility]) - Progression Note    Patient Details  Name: ANTORIA LANZA MRN: [id number] Date of Birth: 04-Jan-1966  [facility] of [facility]) CM/SW Contact  Leeroy Cha, RN Phone Number: 07/26/2019, 8:26 AM  Clinical Narrative:    From home has pcp plan will be to return to home. Admitted with heart failure and copd, iv apresoline for high bp, Expected Discharge Plan: Home/Self Care Barriers to Discharge: Continued Medical Work up   Expected Discharge Plan: Home/Self Care Barriers to Discharge: Continued Medical Work up  Expected Discharge Plan and Services Expected Discharge Plan: Home/Self Care   Discharge Planning Services: CM Consult   Living arrangements for the past 2 months: Single Family Home Expected Discharge Date: (unknown)                                     Social Determinants of Health (SDOH) Interventions    Readmission Risk Interventions Readmission Risk Prevention Plan 07/02/2019 06/25/2019 01/29/2019  Transportation Screening Complete Complete Complete  PCP or Specialist Appt within 3-5 Days - - Complete  Not Complete comments - - -  [place]RI or [place] - - Complete  Social Work Consult for [place] Planning/Counseling - - Complete  Palliative Care Screening - - Not Applicable  Medication Review Press photographer) Complete Complete Complete  PCP or Specialist appointment within 3-5 days of discharge - Complete -  [place] or Home Care Consult Complete - -  SW Recovery Care/Counseling Consult Complete Complete -  Palliative Care Screening Not Applicable Not Applicable -  [place] Not Applicable Not Applicable -  Some recent data might be hidden

## 2019-07-26 NOTE — Progress Notes (Signed)
[facility] Gastroenterology Progress Note  JANAIA KOZEL 54 y.o. [birth year]  CC:  Nausea/vomiting   Subjective: Saw patient in radiology unit after completion of nuclear emptying study.  She has not had any nausea or emesis today but reports one episode of emesis yesterday.  Endorses abdominal pain and requested pain medication.  ROS : Review of Systems  Constitutional: Negative for chills and fever.  Cardiovascular: Negative for chest pain and palpitations.  Gastrointestinal: Positive for abdominal pain, diarrhea and nausea. Negative for blood in stool, constipation, heartburn, melena and vomiting.   Objective: Vital signs in last 24 hours: Vitals:   07/26/19 0735 07/26/19 0800  BP: 127/60   Pulse: (!) 106   Resp: (!) 31   Temp:  98.4 F (36.9 C)  SpO2: 98%     Physical Exam:  General:  Sitting in chair in no acute distress, appears comfortable  Head:  Normocephalic, without obvious abnormality, atraumatic  Eyes:  Anicteric sclera, EOM's intact,   Lungs:   Clear to auscultation bilaterally, respirations unlabored  Heart:  Mildly tachycardic with rhythm, S1/S2 normal  Abdomen:   Soft, non-tender, sluggish bowel sounds, no guarding or peritoneal signs  Extremities: Extremities normal, atraumatic, no  edema  Pulses: 2+ and symmetric    Lab Results: Recent Labs    07/24/19 0253 07/24/19 0253 07/25/19 0204 07/26/19 0239  NA 138   < > 135 134*  K 3.6   < > 3.2* 3.3*  CL 102   < > 99 100  CO2 27   < > 22 26  GLUCOSE 193*   < > 297* 165*  BUN 14   < > 21* 23*  CREATININE 1.51*   < > 2.10* 1.71*  CALCIUM 8.2*   < > 8.8* 8.4*  MG 1.7  --  1.8  --   PHOS 3.1  --  2.5  --    < > = values in this interval not displayed.   Recent Labs    07/25/19 0204 07/26/19 0239  AST 12* 12*  ALT 9 9  ALKPHOS 91 85  BILITOT 0.5 0.5  PROT 6.5 6.3*  ALBUMIN 3.1* 2.9*   Recent Labs    07/25/19 0204 07/26/19 0239  WBC 13.0* 11.4*  NEUTROABS 8.5* 7.2  HGB 7.7* 7.1*  HCT  26.4* 23.1*  MCV 90.4 88.8  PLT 410* 394   No results for input(s): LABPROT, INR in the last 72 hours.   Assessment: Intractable nausea/vomiting, suspect this is related to diabetes; however, gastric emptying studies in 2017 and 2018 were normal.Blood glucose remains elevated. Mental status and clinical status much improved after discontinuation of narcotics.   Chronic kidney disease: Cr 1.71/ BUN 23  Chronic normocytic anemia, Hgb decreased to 7.1 today.  No signs of GI bleeding.  Part of the decrease may be dilutional.   -Ferritin 53 (normal), Iron/TIBC normal, mildly decreased iron saturation (8%); however, iron was low 3 weeks ago (18)  Leukocytosis, improving WBCs 11.4 today  Plan: -Await gastric emptying study results.  If positive, we will proceed with EGD to rule out gastric outlet obstruction. -Restart erythromycin (ordered). -OK to restart diet once back to the floor. -Consider short-course (3 doses) Reglan if N/V persist despite erythromycin -Continue to hold narcotics.  ContinueZofran 4mg  q6h PRNas needed for nausea and vomiting.  Continuelow residue diet with small portions.  Blood glucose control.  [facility] willfollow.   Salley Slaughter PA-C 07/26/2019, 1:03 PM  Contact #  [phone]

## 2019-07-26 NOTE — Progress Notes (Signed)
PROGRESS NOTE    Deborah [name]  GUR:[id number] DOB: 09/22/1965 DOA: 07/21/2019 PCP: [name], DO   Brief Narrative: Patient is a 54 year old female with past medical history of depression, nonhemorrhagic CVA, chronic systolic congestive heart failure, paroxysmal A. fib on Eliquis, diabetes type 2, recurrent admission for DKA, diabetic gastroparesis, CKD stage IIIa, hypertension, gout, gastric ulcer, GERD, anemia of chronic disease who presented to the emergency room with complaints of nausea, vomiting, diffuse abdominal pain.  She was recently admitted here for the management of exacerbation of gastroparesis earlier this month.  GI has been consulted and following.  Plan for gastric emptying test.  On presentation she was tachycardic, tachypneic with lactic acidosis. Abdomen pain has significantly improved .  Underwent gastric emptying study today.  Assessment & Plan:   Active Problems:   Essential hypertension, benign   Uncontrolled type 2 diabetes mellitus with diabetic neuropathy, with long-term current use of insulin (HCC)   CKD (chronic kidney disease), stage II   Diabetic gastroparesis (HCC)   Morbid obesity (HCC)   Anxiety   Intractable nausea and vomiting   Chronic systolic CHF (congestive heart failure) ([place])   Uncontrolled type 2 diabetes mellitus with hyperglycemia ([place])   Hyperosmolar hyperglycemic state (HHS) ([place])   Drug-seeking behavior   Suspected sepsis: Sepsis ruled out.  Antibiotics discontinued.  She was tachypneic, tachycardic with lactic acidosis on presentation.  Elevated white cell count on presentation.  Lactate is normalized.  Procalcitonin negative.  Abdominal pain/gastroparesis: Most likely secondary to her uncontrolled diabetes.  She has been admitted multiple times for the same problem.  On erythromycin 3 times daily.  She has history of noncompliance.    GI consulted and she underwent  gastric emptying test .  If the test comes out to be  positive, GI planning for EGD to rule out GOO.  Chronic systolic CHF: Continue input/output monitoring, daily weight.  She has history of noncompliance.  She has not been following with her cardiologist.  Case was discussed with cardiology and she has been arranging outpatient follow-up.  Currently on Toprol, hydralazine.  Echo showed ejection fraction 45 to 50%, global hypokinesis, moderately elevated pulmonary artery systolic pressure.  The findings are similar to the previous echo on 11/20. She looked dehydrated,started  on gentle IV fluids.  We will continue gentle IV fluids today.  Hypertension: Currently blood pressure stable.  Continue current medications  Paroxysmal  A. fib: On Eliquis for anticoagulation.  On Toprol for rate control.  AKI on CKD stage IIIa: Baseline creatinine 1.3.  Creatinine creeped up to the level of 2.1 .  Improving with IV fluids  Type 2 diabetes mellitus: Recent hemoglobin A1c of 8.4.  Continue Lantus and sliding cell insulin along with NovoLog.  Diabetic coordinator  has been following.  History of nonhemorrhagic CVA: Continue current medications.  Hyperlipidemia: On statin at home.  Morbid obesity: BMI 51.6.  Hypokalemia: Supplemented with potassium  Normocytic anemia: Hemoglobin of 7.1 iron level low normal.  No evidence of acute blood loss.  Could be associated with her CKD.  We will transfuse her with 1 unit of PRBC today.  Tardive dyskinesia: Has involuntary movement of her tongue and mouth consistent with tardive dyskinesia.  Could be associated with Reglan that she was using in the past.  Continue supportive care         DVT prophylaxis:Eliquis Code Status: Full Family Communication: None present at the bedside Status is: Inpatient  Remains inpatient appropriate because:Ongoing diagnostic testing needed not  appropriate for outpatient work up   Dispo: The patient is from: Home              Anticipated d/c is to: Home               Anticipated d/c date is: 1-2 days              Patient currently is not medically stable to d/c. Needs GI clearance before discharge.   Consultants: GI  Procedures:None  Antimicrobials:  Anti-infectives (From admission, onward)   Start     Dose/Rate Route Frequency Ordered Stop   07/24/19 1700  erythromycin (E-MYCIN) tablet 250 mg  Status:  Discontinued     250 mg Oral 3 times daily with meals & bedtime 07/24/19 1205 07/25/19 0838   07/22/19 0800  erythromycin (E-MYCIN) tablet 250 mg  Status:  Discontinued     250 mg Oral 3 times daily with meals & bedtime 07/22/19 0723 07/23/19 1507   07/22/19 0800  ciprofloxacin (CIPRO) IVPB 400 mg  Status:  Discontinued     400 mg 200 mL/hr over 60 Minutes Intravenous Every 12 hours 07/22/19 0734 07/23/19 0833   07/22/19 0800  metroNIDAZOLE (FLAGYL) IVPB 500 mg  Status:  Discontinued     500 mg 100 mL/hr over 60 Minutes Intravenous Every 8 hours 07/22/19 0734 07/23/19 0833      Subjective: Patient seen and examined the bedside this afternoon.  Currently hemodynamically stable.  Comfortable and talking on phone.  Came back from gastric emptying test.  Denies any new complaints.  Sitting on the chair.  Complains of some abdominal discomfort today.  Objective: Vitals:   07/26/19 0050 07/26/19 0400 07/26/19 0500 07/26/19 0735  BP: (!) 168/77   127/60  Pulse: 100   (!) 106  Resp: 12   (!) 31  Temp:  99 F (37.2 C)    TempSrc:  Axillary    SpO2: 100%   98%  Weight:   (!) 150.8 kg   Height:        Intake/Output Summary (Last 24 hours) at 07/26/2019 0816 Last data filed at 07/25/2019 1800 Gross per 24 hour  Intake 895 ml  Output --  Net 895 ml   Filed Weights   07/23/19 0500 07/24/19 0600 07/26/19 0500  Weight: (!) 145.2 kg (!) 145.2 kg (!) 150.8 kg    Examination:  General exam: Not in distress, morbidly obese HEENT:PERRL,Oral mucosa dry, Ear/Nose normal on gross exam, involuntary movement of the mouth/tongue Respiratory system:  Bilateral equal air entry, normal vesicular breath sounds, no wheezes or crackles  Cardiovascular system: S1 & S2 heard, RRR. No JVD, murmurs, rubs, gallops or clicks. No pedal edema. Gastrointestinal system: Abdomen is nondistended, soft and nontender. No organomegaly or masses felt. Normal bowel sounds heard. Central nervous system: Alert and oriented. No focal neurological deficits. Extremities: 1-2+ pedal  edema, no clubbing ,no cyanosis, distal peripheral pulses palpable. Skin: No rashes, lesions or ulcers,no icterus ,no pallor  Data Reviewed: I have personally reviewed following labs and imaging studies  CBC: Recent Labs  Lab 07/22/19 2016 07/23/19 0122 07/24/19 0253 07/25/19 0204 07/26/19 0239  WBC 18.1* 16.4* 15.2* 13.0* 11.4*  NEUTROABS 14.7*  --   --  8.5* 7.2  HGB 9.0* 8.5* 7.8* 7.7* 7.1*  HCT 30.0* 27.5* 26.8* 26.4* 23.1*  MCV 89.8 87.0 91.5 90.4 88.8  PLT 482* 318 386 410* 323   Basic Metabolic Panel: Recent Labs  Lab 07/22/19 1905 07/23/19 0122 07/24/19 0253  07/25/19 0204 07/26/19 0239  NA 139 137 138 135 134*  K 3.6 3.9 3.6 3.2* 3.3*  CL 99 103 102 99 100  CO2 27 22 27 22 26   GLUCOSE 165* 169* 193* 297* 165*  BUN 19 16 14  21* 23*  CREATININE 1.86* 1.54* 1.51* 2.10* 1.71*  CALCIUM 8.4* 8.0* 8.2* 8.8* 8.4*  MG 1.5*  --  1.7 1.8  --   PHOS 3.1  --  3.1 2.5  --    GFR: Estimated Creatinine Clearance: 56.3 mL/min (A) (by C-G formula based on SCr of 1.71 mg/dL (H)). Liver Function Tests: Recent Labs  Lab 07/21/19 2335 07/23/19 0122 07/24/19 0253 07/25/19 0204 07/26/19 0239  AST 21 11* 11* 12* 12*  ALT 13 11 8 9 9   ALKPHOS 101 81 79 91 85  BILITOT 1.0 0.6 0.8 0.5 0.5  PROT 7.8 6.6 6.4* 6.5 6.3*  ALBUMIN 3.6 3.1* 3.0* 3.1* 2.9*   Recent Labs  Lab 07/21/19 2335  LIPASE 23   No results for input(s): AMMONIA in the last 168 hours. Coagulation Profile: No results for input(s): INR, PROTIME in the last 168 hours. Cardiac Enzymes: No results for  input(s): CKTOTAL, CKMB, CKMBINDEX, TROPONINI in the last 168 hours. BNP (last 3 results) No results for input(s): PROBNP in the last 8760 hours. HbA1C: No results for input(s): HGBA1C in the last 72 hours. CBG: Recent Labs  Lab 07/25/19 1607 07/25/19 2013 07/25/19 2342 07/26/19 0419 07/26/19 0758  GLUCAP 365* 284* 197* 155* 167*   Lipid Profile: No results for input(s): CHOL, HDL, LDLCALC, TRIG, CHOLHDL, LDLDIRECT in the last 72 hours. Thyroid Function Tests: No results for input(s): TSH, T4TOTAL, FREET4, T3FREE, THYROIDAB in the last 72 hours. Anemia Panel: Recent Labs    07/26/19 0239  FERRITIN 53  TIBC 372  IRON 31   Sepsis Labs: Recent Labs  Lab 07/22/19 1906 07/23/19 0615 07/24/19 1004 07/24/19 1354 07/25/19 0204  PROCALCITON  --   --  <0.10  --  <0.10  LATICACIDVEN 3.2* 0.9 0.9 2.5*  --     Recent Results (from the past 240 hour(s))  Urine culture     Status: Abnormal   Collection Time: 07/21/19 11:36 PM   Specimen: Urine, Clean Catch  Result Value Ref Range Status   Specimen Description   Final    URINE, CLEAN CATCH Performed at [facility], [place] [address] [place], [place] [address]    Special Requests   Final    NONE Performed at [facility], [place] [address], [place], [place] [address]    Culture MULTIPLE SPECIES PRESENT, SUGGEST RECOLLECTION (A)  Final   Report Status 07/23/2019 FINAL  Final  SARS Coronavirus 2 by RT PCR (hospital order, performed in [facility] hospital lab) Nasopharyngeal Nasopharyngeal Swab     Status: None   Collection Time: 07/22/19 11:11 PM   Specimen: Nasopharyngeal Swab  Result Value Ref Range Status   SARS Coronavirus 2 NEGATIVE NEGATIVE Final    Comment: (NOTE) SARS-CoV-2 target nucleic acids are NOT DETECTED. The SARS-CoV-2 RNA is generally detectable in upper and lower respiratory specimens during the acute phase of infection. The lowest concentration of SARS-CoV-2 viral  copies this assay can detect is 250 copies / mL. A negative result does not preclude SARS-CoV-2 infection and should not be used as the sole basis for treatment or other patient management decisions.  A negative result may occur with improper specimen collection / handling, submission of specimen other than  nasopharyngeal swab, presence of viral mutation(s) within the areas targeted by this assay, and inadequate number of viral copies (<250 copies / mL). A negative result must be combined with clinical observations, patient history, and epidemiological information. Fact Sheet for Patients:   StrictlyIdeas.no Fact Sheet for Healthcare Providers: BankingDealers.co.za This test is not yet approved or cleared  by the [place] FDA and has been authorized for detection and/or diagnosis of SARS-CoV-2 by FDA under an Emergency Use Authorization (EUA).  This EUA will remain in effect (meaning this test can be used) for the duration of the COVID-19 declaration under Section 564(b)(1) of the Act, 21 U.S.C. section 360bbb-3(b)(1), unless the authorization is terminated or revoked sooner. Performed at [facility], [place] [address] [place], [place] [address]   MRSA PCR Screening     Status: None   Collection Time: 07/22/19 11:44 PM   Specimen: Nasopharyngeal  Result Value Ref Range Status   MRSA by PCR NEGATIVE NEGATIVE Final    Comment:        The GeneXpert MRSA Assay (FDA approved for NASAL specimens only), is one component of a comprehensive MRSA colonization surveillance program. It is not intended to diagnose MRSA infection nor to guide or monitor treatment for MRSA infections. Performed at [facility], [place] [address], [place], [place] [address]          Radiology Studies: No results found.      Scheduled Meds: . sodium chloride   Intravenous Once  . apixaban  5 mg Oral BID  .  Chlorhexidine Gluconate Cloth  6 each Topical Daily  . DULoxetine  40 mg Oral Daily  . hydrALAZINE  5 mg Intravenous Q6H  . insulin aspart  0-15 Units Subcutaneous Q4H  . insulin aspart  5 Units Subcutaneous TID WC  . insulin glargine  35 Units Subcutaneous Daily  . isosorbide mononitrate  60 mg Oral Daily  . metoprolol succinate  50 mg Oral Daily  . pantoprazole  40 mg Oral Daily   Continuous Infusions: . sodium chloride 10 mL/hr at 07/23/19 1400  . sodium chloride 75 mL/hr at 07/25/19 1327  . potassium chloride       LOS: 4 days    Time spent:25 mins. More than 50% of that time was spent in counseling and/or coordination of care.      Shelly Coss, MD [facility] P5/20/2021, 8:16 AM

## 2019-07-26 NOTE — Progress Notes (Signed)
Patient's gastric emptying study today showed delayed emptying as noted below:  1.5% emptied at 1 hr ( normal >= 10%)  6.3% emptied at 2 hr ( normal >= 40%)  16% emptied at 3 hr ( normal >= 70%)  46% emptied at 4 hr ( normal >= 90%)  We will proceed with EGD tomorrow to rule out gastric outlet obstruction due to persistent nausea and vomiting.  NPO at midnight except for sips with meds.  I thoroughly discussed the procedure, benefits, and risks (including but not limited to bleeding, infection, perforation, anesthesia).  Patient was given the opportunity ask questions.  Patient gave verbal consent to proceed with procedure.  [facility] will follow.  Salley Slaughter PA-C 07/26/2019 4:09PM  Contact #  [phone]

## 2019-07-27 ENCOUNTER — Inpatient Hospital Stay (HOSPITAL_COMMUNITY): Payer: Medicare Other | Admitting: Certified Registered"

## 2019-07-27 ENCOUNTER — Encounter (HOSPITAL_COMMUNITY)
Admission: EM | Disposition: A | Discharge status: Home / Self Care | Payer: Self-pay | Source: Home / Self Care | Attending: Internal Medicine

## 2019-07-27 ENCOUNTER — Encounter (HOSPITAL_COMMUNITY): Payer: Self-pay | Admitting: Internal Medicine

## 2019-07-27 HISTORY — PX: BIOPSY: SHX5522

## 2019-07-27 HISTORY — PX: ESOPHAGOGASTRODUODENOSCOPY: SHX5428

## 2019-07-27 LAB — CBC WITH DIFFERENTIAL/PLATELET
Abs Immature Granulocytes: 0.04 10*3/uL (ref 0.00–0.07)
Basophils Absolute: 0.1 10*3/uL (ref 0.0–0.1)
Basophils Relative: 1 %
Eosinophils Absolute: 0.3 10*3/uL (ref 0.0–0.5)
Eosinophils Relative: 3 %
HCT: 25.4 % — ABNORMAL LOW (ref 36.0–46.0)
Hemoglobin: 7.6 g/dL — ABNORMAL LOW (ref 12.0–15.0)
Immature Granulocytes: 0 %
Lymphocytes Relative: 20 %
Lymphs Abs: 2.4 10*3/uL (ref 0.7–4.0)
MCH: 26.6 pg (ref 26.0–34.0)
MCHC: 29.9 g/dL — ABNORMAL LOW (ref 30.0–36.0)
MCV: 88.8 fL (ref 80.0–100.0)
Monocytes Absolute: 1 10*3/uL (ref 0.1–1.0)
Monocytes Relative: 8 %
Neutro Abs: 8.2 10*3/uL — ABNORMAL HIGH (ref 1.7–7.7)
Neutrophils Relative %: 68 %
Platelets: 365 10*3/uL (ref 150–400)
RBC: 2.86 MIL/uL — ABNORMAL LOW (ref 3.87–5.11)
RDW: 15.7 % — ABNORMAL HIGH (ref 11.5–15.5)
WBC: 12 10*3/uL — ABNORMAL HIGH (ref 4.0–10.5)
nRBC: 0 % (ref 0.0–0.2)

## 2019-07-27 LAB — BPAM RBC
Blood Product Expiration Date: 202106212359
ISSUE DATE / TIME: 202105202051
Unit Type and Rh: 5100

## 2019-07-27 LAB — COMPREHENSIVE METABOLIC PANEL
ALT: 9 U/L (ref 0–44)
AST: 10 U/L — ABNORMAL LOW (ref 15–41)
Albumin: 2.8 g/dL — ABNORMAL LOW (ref 3.5–5.0)
Alkaline Phosphatase: 84 U/L (ref 38–126)
Anion gap: 5 (ref 5–15)
BUN: 25 mg/dL — ABNORMAL HIGH (ref 6–20)
CO2: 26 mmol/L (ref 22–32)
Calcium: 8.6 mg/dL — ABNORMAL LOW (ref 8.9–10.3)
Chloride: 104 mmol/L (ref 98–111)
Creatinine, Ser: 1.34 mg/dL — ABNORMAL HIGH (ref 0.44–1.00)
GFR calc Af Amer: 52 mL/min — ABNORMAL LOW (ref >60)
GFR calc non Af Amer: 44 mL/min — ABNORMAL LOW (ref >60)
Glucose, Bld: 163 mg/dL — ABNORMAL HIGH (ref 70–99)
Potassium: 3.6 mmol/L (ref 3.5–5.1)
Sodium: 135 mmol/L (ref 135–145)
Total Bilirubin: 0.8 mg/dL (ref 0.3–1.2)
Total Protein: 6.1 g/dL — ABNORMAL LOW (ref 6.5–8.1)

## 2019-07-27 LAB — TYPE AND SCREEN
ABO/RH(D): O POS
Antibody Screen: NEGATIVE
Unit division: 0

## 2019-07-27 LAB — HEMOGLOBIN AND HEMATOCRIT, BLOOD
HCT: 25.4 % — ABNORMAL LOW (ref 36.0–46.0)
Hemoglobin: 7.7 g/dL — ABNORMAL LOW (ref 12.0–15.0)

## 2019-07-27 LAB — GLUCOSE, CAPILLARY
Glucose-Capillary: 122 mg/dL — ABNORMAL HIGH (ref 70–99)
Glucose-Capillary: 139 mg/dL — ABNORMAL HIGH (ref 70–99)
Glucose-Capillary: 157 mg/dL — ABNORMAL HIGH (ref 70–99)
Glucose-Capillary: 158 mg/dL — ABNORMAL HIGH (ref 70–99)
Glucose-Capillary: 181 mg/dL — ABNORMAL HIGH (ref 70–99)
Glucose-Capillary: 212 mg/dL — ABNORMAL HIGH (ref 70–99)
Glucose-Capillary: 222 mg/dL — ABNORMAL HIGH (ref 70–99)

## 2019-07-27 SURGERY — EGD (ESOPHAGOGASTRODUODENOSCOPY)
Anesthesia: Monitor Anesthesia Care

## 2019-07-27 MED ORDER — PROPOFOL 500 MG/50ML IV EMUL
INTRAVENOUS | Status: DC | PRN
  Administered 2019-07-27: 125 ug/kg/min via INTRAVENOUS

## 2019-07-27 MED ORDER — PROPOFOL 500 MG/50ML IV EMUL
INTRAVENOUS | Status: AC
  Filled 2019-07-27: qty 50

## 2019-07-27 MED ORDER — SODIUM CHLORIDE 0.9 % IV SOLN: INTRAVENOUS | Status: DC

## 2019-07-27 MED ORDER — LACTATED RINGERS IV SOLN: INTRAVENOUS | Status: DC

## 2019-07-27 MED ORDER — PROPOFOL 10 MG/ML IV BOLUS
INTRAVENOUS | Status: DC | PRN
  Administered 2019-07-27: 30 mg via INTRAVENOUS

## 2019-07-27 NOTE — Op Note (Signed)
[facility] Patient Name: Deborah [name] Procedure Date: 07/27/2019 MRN: [id number] Attending MD: Clarene Essex , MD Date of Birth: [birth year] [name]: [id number] Age: 54 Admit Type: Inpatient Procedure:                Upper GI endoscopy Indications:              Generalized abdominal pain, Unexplained iron                            deficiency anemia, Nausea with vomiting Providers:                Clarene Essex, MD, Benetta Spar RN, RN, Laverda Sorenson, Technician, Dellie Catholic Referring MD:              Medicines:                Propofol total dose 294 mg IV Complications:            No immediate complications. Estimated Blood Loss:     Estimated blood loss: none. Procedure:                Pre-Anesthesia Assessment:                           - Prior to the procedure, a History and Physical                            was performed, and patient medications and                            allergies were reviewed. The patient's tolerance of                            previous anesthesia was also reviewed. The risks                            and benefits of the procedure and the sedation                            options and risks were discussed with the patient.                            All questions were answered, and informed consent                            was obtained. Prior Anticoagulants: The patient has                            taken Eliquis (apixaban), last dose was day of                            procedure. ASA Grade Assessment: III - A patient  with severe systemic disease. After reviewing the                            risks and benefits, the patient was deemed in                            satisfactory condition to undergo the procedure.                           After obtaining informed consent, the endoscope was                            passed under direct vision. Throughout the                             procedure, the patient's blood pressure, pulse, and                            oxygen saturations were monitored continuously. The                            GIF-H190 ([id number]) Olympus gastroscope was                            introduced through the mouth, and advanced to the                            third part of duodenum. The upper GI endoscopy was                            accomplished without difficulty. The patient                            tolerated the procedure well. Scope In: Scope Out: Findings:      A small hiatal hernia was present.      The entire examined stomach was normal.      A single medium-sized semi-sessile polyp was found in the duodenal bulb.       Biopsies were taken with a cold forceps for histology.      The second portion of the duodenum and third portion of the duodenum       were normal.      The exam was otherwise without abnormality. Impression:               - Small hiatal hernia.                           - Normal stomach.                           - A single duodenal polyp. Biopsied.                           - Normal second portion of the duodenum and third  portion of the duodenum.                           - The examination was otherwise normal. Moderate Sedation:      Not Applicable - Patient had care per Anesthesia. Recommendation:           - Resume regular diet today. A dietary consult and                            either a nursing home or assisted living for proper                            diet to include watching her sugar cholesterol salt                            and a gastroparesis diet may be helpful                           - Continue present medications.                           - Await pathology results.                           - Return to GI clinic PRN.                           - Telephone GI clinic if symptomatic PRN.                           - Perform a colonoscopy in 3 days if my  rounding                            partner Dr. Penelope Coop agrees in which case we can                            change her tomorrow to clear liquids and prep her                            on Sunday and have procedure on Monday. Procedure Code(s):        --- Professional ---                           43 239, Esophagogastroduodenoscopy, flexible,                            transoral; with biopsy, single or multiple Diagnosis Code(s):        --- Professional ---                           K44.9, Diaphragmatic hernia without obstruction or                            gangrene  K31.7, Polyp of stomach and duodenum                           R10.84, Generalized abdominal pain                           D50.9, Iron deficiency anemia, unspecified                           R11.2, Nausea with vomiting, unspecified CPT copyright 2019 American Medical Association. All rights reserved. The codes documented in this report are preliminary and upon coder review may  be revised to meet current compliance requirements. Clarene Essex, MD 07/27/2019 3:04:39 PM This report has been signed electronically. Number of Addenda: 0

## 2019-07-27 NOTE — Progress Notes (Signed)
Deborah [name] 2:18 PM  Subjective: Patient doing okay today in good spirits no new complaints  Objective: Vital signs stable afebrile exam please see preassessment evaluation labs stable  Assessment: Delayed gastric emptying anemia multiple GI complaints  Plan: Okay to proceed with endoscopy with anesthesia assistance  [facility]  office [phone] After 5PM or if no answer call [phone]

## 2019-07-27 NOTE — Anesthesia Preprocedure Evaluation (Addendum)
Anesthesia Evaluation  Patient identified by MRN, date of birth, ID band Patient awake    Reviewed: Allergy & Precautions, NPO status , Patient's Chart, lab work & pertinent test results, reviewed documented beta blocker date and time   History of Anesthesia Complications Negative for: history of anesthetic complications  Airway Mallampati: II  TM Distance: >3 FB Neck ROM: Full    Dental  (+) Edentulous Upper, Dental Advisory Given, Missing   Pulmonary shortness of breath, COPD,  COPD inhaler,  07/22/2019 SARS coronavirus NEG   breath sounds clear to auscultation       Cardiovascular hypertension, Pt. on medications and Pt. on home beta blockers (-) angina+ dysrhythmias Atrial Fibrillation  Rhythm:Regular Rate:Normal  07/23/2019 ECHO: EF 45-50%, valves OK   Neuro/Psych Anxiety Depression CVA (R sided weakness, uses wheelchair), Residual Symptoms    GI/Hepatic Neg liver ROS, GERD  Medicated and Controlled,  Endo/Other  diabetes, Insulin DependentMorbid obesity  Renal/GU Renal InsufficiencyRenal disease (creat 1.34)     Musculoskeletal  (+) Fibromyalgia -  Abdominal (+) + obese,   Peds  Hematology  (+) Blood dyscrasia (Hb 7.7), anemia , eliquis   Anesthesia Other Findings   Reproductive/Obstetrics                            Anesthesia Physical Anesthesia Plan  ASA: III  Anesthesia Plan: MAC   Post-op Pain Management:    Induction:   PONV Risk Score and Plan: 2 and Ondansetron  Airway Management Planned: Natural Airway and Nasal Cannula  Additional Equipment: None  Intra-op Plan:   Post-operative Plan:   Informed Consent: I have reviewed the patients History and Physical, chart, labs and discussed the procedure including the risks, benefits and alternatives for the proposed anesthesia with the patient or authorized representative who has indicated his/her understanding and  acceptance.     Dental advisory given  Plan Discussed with: CRNA and Surgeon  Anesthesia Plan Comments:        Anesthesia Quick Evaluation

## 2019-07-27 NOTE — Anesthesia Postprocedure Evaluation (Signed)
Anesthesia Post Note  Patient: Deborah [name]  Procedure(s) Performed: ESOPHAGOGASTRODUODENOSCOPY (EGD) (N/A ) BIOPSY     Patient location during evaluation: Endoscopy Anesthesia Type: MAC Level of consciousness: sedated, oriented and patient cooperative Pain management: pain level controlled Vital Signs Assessment: post-procedure vital signs reviewed and stable Respiratory status: spontaneous breathing, nonlabored ventilation and respiratory function stable Cardiovascular status: blood pressure returned to baseline and stable Postop Assessment: no apparent nausea or vomiting Anesthetic complications: no    Last Vitals:  Vitals:   07/27/19 1216 07/27/19 1334  BP: 123/74 (!) 159/89  Pulse: 92 87  Resp: 14 15  Temp: (!) 36.3 C (!) 35.3 C  SpO2: 100% 97%    Last Pain:  Vitals:   07/27/19 1334  TempSrc: Oral  PainSc: 7                  Breckyn Troyer,E. Jahziah Simonin

## 2019-07-27 NOTE — Progress Notes (Signed)
PROGRESS NOTE    Deborah [name]  PYK:[id number] DOB: [birth year] DOA: 07/21/2019 PCP: [name], DO   Brief Narrative: Patient is a 54 year old female with past medical history of depression, nonhemorrhagic CVA, chronic systolic congestive heart failure, paroxysmal A. fib on Eliquis, diabetes type 2, recurrent admission for DKA, diabetic gastroparesis, CKD stage IIIa, hypertension, gout, gastric ulcer, GERD, anemia of chronic disease who presented to the emergency room with complaints of nausea, vomiting, diffuse abdominal pain.  She was recently admitted here for the management of exacerbation of gastroparesis earlier this month.  GI has been consulted and following.  Plan for gastric emptying test.  On presentation she was tachycardic, tachypneic with lactic acidosis.   Underwent gastric emptying study which came out to be positive.  Undergoing EGD today to rule out gastric outlet obstruction.  Assessment & Plan:   Active Problems:   Essential hypertension, benign   Uncontrolled type 2 diabetes mellitus with diabetic neuropathy, with long-term current use of insulin (HCC)   CKD (chronic kidney disease), stage II   Diabetic gastroparesis (HCC)   Morbid obesity (HCC)   Anxiety   Intractable nausea and vomiting   Chronic systolic CHF (congestive heart failure) ([place])   Uncontrolled type 2 diabetes mellitus with hyperglycemia ([place])   Hyperosmolar hyperglycemic state (HHS) ([place])   Drug-seeking behavior   Suspected sepsis: Sepsis ruled out.  Antibiotics discontinued.  She was tachypneic, tachycardic with lactic acidosis on presentation.  Elevated white cell count on presentation.  Lactate is normalized.  Procalcitonin negative.  Abdominal pain/gastroparesis: Most likely secondary to her uncontrolled diabetes.  She has been admitted multiple times for the same problem.  On erythromycin 3 times daily.  She has history of noncompliance.    GI consulted and she underwent  gastric  emptying test .  test came out to be positive, GI planning for EGD to rule out GOO.  Chronic systolic CHF: Continue input/output monitoring, daily weight.  She has history of noncompliance.  She has not been following with her cardiologist.  Case was discussed with cardiology and she has been arranging outpatient follow-up.  Currently on Toprol, hydralazine.  Echo showed ejection fraction 45 to 50%, global hypokinesis, moderately elevated pulmonary artery systolic pressure.  The findings are similar to the previous echo on 11/20. She looked dehydrated,started  on gentle IV fluids.  We will continue gentle IV fluids today.  Hypertension: Currently blood pressure stable.  Continue current medications  Paroxysmal  A. fib: On Eliquis for anticoagulation.  On Toprol for rate control.  AKI on CKD stage IIIa: Baseline creatinine 1.3.  Creatinine creeped up to the level of 2.1 .  Improving with IV fluids  Type 2 diabetes mellitus: Recent hemoglobin A1c of 8.4.  Continue Lantus and sliding cell insulin along with NovoLog.  Diabetic coordinator  has been following.  History of nonhemorrhagic CVA: Continue current medications.  Hyperlipidemia: On statin at home.  Morbid obesity: BMI 51.6.  Hypokalemia: Supplemented with potassium  Normocytic anemia: Hemoglobin of 7.1 iron level low normal.  No evidence of acute blood loss.  Could be associated with her CKD.  Transfused with the unit of PRBC.  Hemoglobin 7.6 today..  Tardive dyskinesia: Has involuntary movement of her tongue and mouth consistent with tardive dyskinesia.  Could be associated with Reglan that she was using in the past.  Continue supportive care         DVT prophylaxis:Eliquis Code Status: Full Family Communication: Called and discussed with mother on phone 07/27/19 Status  is: Inpatient  Remains inpatient appropriate because:Ongoing diagnostic testing needed not appropriate for outpatient work up   Dispo: The patient is from:  Home              Anticipated d/c is to: Home              Anticipated d/c date is: 1-2 days              Patient currently is not medically stable to d/c. Needs GI clearance before discharge.   Consultants: GI  Procedures:None  Antimicrobials:  Anti-infectives (From admission, onward)   Start     Dose/Rate Route Frequency Ordered Stop   07/26/19 1700  erythromycin (E-MYCIN) tablet 250 mg     250 mg Oral 3 times daily with meals & bedtime 07/26/19 1312     07/24/19 1700  erythromycin (E-MYCIN) tablet 250 mg  Status:  Discontinued     250 mg Oral 3 times daily with meals & bedtime 07/24/19 1205 07/25/19 0838   07/22/19 0800  erythromycin (E-MYCIN) tablet 250 mg  Status:  Discontinued     250 mg Oral 3 times daily with meals & bedtime 07/22/19 0723 07/23/19 1507   07/22/19 0800  ciprofloxacin (CIPRO) IVPB 400 mg  Status:  Discontinued     400 mg 200 mL/hr over 60 Minutes Intravenous Every 12 hours 07/22/19 0734 07/23/19 0833   07/22/19 0800  metroNIDAZOLE (FLAGYL) IVPB 500 mg  Status:  Discontinued     500 mg 100 mL/hr over 60 Minutes Intravenous Every 8 hours 07/22/19 0734 07/23/19 0833      Subjective: Patient seen and examined the bedside this morning.  Hemodynamically stable.  Sitting at the edge of the bed.  She was complaining of abdominal discomfort today.  I offered him Reglan if she continues to have severe abdominal pain and she states she will think about it and let me know.  Plan for upper GI endoscopy.  Objective: Vitals:   07/26/19 2050 07/26/19 2105 07/26/19 2308 07/27/19 0505  BP: 133/78 136/76 125/76 125/73  Pulse: 99 98 99 99  Resp: 20 20 17    Temp: 98 F (36.7 C) (!) 97.5 F (36.4 C) 98.2 F (36.8 C) (!) 97.2 F (36.2 C)  TempSrc: Oral Oral Oral Oral  SpO2: 97% 100% 100% 99%  Weight:      Height:        Intake/Output Summary (Last 24 hours) at 07/27/2019 0740 Last data filed at 07/26/2019 2308 Gross per 24 hour  Intake 1860.4 ml  Output --  Net  1860.4 ml   Filed Weights   07/23/19 0500 07/24/19 0600 07/26/19 0500  Weight: (!) 145.2 kg (!) 145.2 kg (!) 150.8 kg    Examination:  General exam: In mild distress due to abdominal pain, morbidly obese  Respiratory system: Bilateral equal air entry, normal vesicular breath sounds, no wheezes or crackles  Cardiovascular system: S1 & S2 heard, RRR. No JVD, murmurs, rubs, gallops or clicks. Gastrointestinal system: Abdomen is nondistended, soft and nontender. No organomegaly or masses felt. Normal bowel sounds heard. Central nervous system: Alert and oriented. No focal neurological deficits. Extremities: Trace pedal edema, no clubbing ,no cyanosis, distal peripheral pulses palpable. Skin: No rashes, lesions or ulcers,no icterus ,no pallor   Data Reviewed: I have personally reviewed following labs and imaging studies  CBC: Recent Labs  Lab 07/22/19 2016 07/22/19 2016 07/23/19 0122 07/24/19 0253 07/25/19 0204 07/26/19 0239 07/27/19 0401  WBC 18.1*   < >  16.4* 15.2* 13.0* 11.4* 12.0*  NEUTROABS 14.7*  --   --   --  8.5* 7.2 8.2*  HGB 9.0*   < > 8.5* 7.8* 7.7* 7.1* 7.6*  7.7*  HCT 30.0*   < > 27.5* 26.8* 26.4* 23.1* 25.4*  25.4*  MCV 89.8   < > 87.0 91.5 90.4 88.8 88.8  PLT 482*   < > 318 386 410* 394 365   < > = values in this interval not displayed.   Basic Metabolic Panel: Recent Labs  Lab 07/22/19 1905 07/22/19 1905 07/23/19 0122 07/24/19 0253 07/25/19 0204 07/26/19 0239 07/27/19 0401  NA 139   < > 137 138 135 134* 135  K 3.6   < > 3.9 3.6 3.2* 3.3* 3.6  CL 99   < > 103 102 99 100 104  CO2 27   < > 22 27 22 26 26   GLUCOSE 165*   < > 169* 193* 297* 165* 163*  BUN 19   < > 16 14 21* 23* 25*  CREATININE 1.86*   < > 1.54* 1.51* 2.10* 1.71* 1.34*  CALCIUM 8.4*   < > 8.0* 8.2* 8.8* 8.4* 8.6*  MG 1.5*  --   --  1.7 1.8  --   --   PHOS 3.1  --   --  3.1 2.5  --   --    < > = values in this interval not displayed.   GFR: Estimated Creatinine Clearance: 71.8 mL/min  (A) (by C-G formula based on SCr of 1.34 mg/dL (H)). Liver Function Tests: Recent Labs  Lab 07/23/19 0122 07/24/19 0253 07/25/19 0204 07/26/19 0239 07/27/19 0401  AST 11* 11* 12* 12* 10*  ALT 11 8 9 9 9   ALKPHOS 81 79 91 85 84  BILITOT 0.6 0.8 0.5 0.5 0.8  PROT 6.6 6.4* 6.5 6.3* 6.1*  ALBUMIN 3.1* 3.0* 3.1* 2.9* 2.8*   Recent Labs  Lab 07/21/19 2335  LIPASE 23   No results for input(s): AMMONIA in the last 168 hours. Coagulation Profile: No results for input(s): INR, PROTIME in the last 168 hours. Cardiac Enzymes: No results for input(s): CKTOTAL, CKMB, CKMBINDEX, TROPONINI in the last 168 hours. BNP (last 3 results) No results for input(s): PROBNP in the last 8760 hours. HbA1C: No results for input(s): HGBA1C in the last 72 hours. CBG: Recent Labs  Lab 07/26/19 1733 07/26/19 1909 07/26/19 2312 07/27/19 0502 07/27/19 0724  GLUCAP 288* 331* 192* 158* 157*   Lipid Profile: No results for input(s): CHOL, HDL, LDLCALC, TRIG, CHOLHDL, LDLDIRECT in the last 72 hours. Thyroid Function Tests: No results for input(s): TSH, T4TOTAL, FREET4, T3FREE, THYROIDAB in the last 72 hours. Anemia Panel: Recent Labs    07/26/19 0239  FERRITIN 53  TIBC 372  IRON 31   Sepsis Labs: Recent Labs  Lab 07/22/19 1906 07/23/19 0615 07/24/19 1004 07/24/19 1354 07/25/19 0204  PROCALCITON  --   --  <0.10  --  <0.10  LATICACIDVEN 3.2* 0.9 0.9 2.5*  --     Recent Results (from the past 240 hour(s))  Urine culture     Status: Abnormal   Collection Time: 07/21/19 11:36 PM   Specimen: Urine, Clean Catch  Result Value Ref Range Status   Specimen Description   Final    URINE, CLEAN CATCH Performed at [facility], [place] [address] [place], [place] [address]    Special Requests   Final    NONE Performed at [facility],  [place] [address] [place], [place] [address]    Culture MULTIPLE SPECIES PRESENT, SUGGEST RECOLLECTION (A)  Final   Report  Status 07/23/2019 FINAL  Final  SARS Coronavirus 2 by RT PCR (hospital order, performed in [facility] hospital lab) Nasopharyngeal Nasopharyngeal Swab     Status: None   Collection Time: 07/22/19 11:11 PM   Specimen: Nasopharyngeal Swab  Result Value Ref Range Status   SARS Coronavirus 2 NEGATIVE NEGATIVE Final    Comment: (NOTE) SARS-CoV-2 target nucleic acids are NOT DETECTED. The SARS-CoV-2 RNA is generally detectable in upper and lower respiratory specimens during the acute phase of infection. The lowest concentration of SARS-CoV-2 viral copies this assay can detect is 250 copies / mL. A negative result does not preclude SARS-CoV-2 infection and should not be used as the sole basis for treatment or other patient management decisions.  A negative result may occur with improper specimen collection / handling, submission of specimen other than nasopharyngeal swab, presence of viral mutation(s) within the areas targeted by this assay, and inadequate number of viral copies (<250 copies / mL). A negative result must be combined with clinical observations, patient history, and epidemiological information. Fact Sheet for Patients:   StrictlyIdeas.no Fact Sheet for Healthcare Providers: BankingDealers.co.za This test is not yet approved or cleared  by the [place] FDA and has been authorized for detection and/or diagnosis of SARS-CoV-2 by FDA under an Emergency Use Authorization (EUA).  This EUA will remain in effect (meaning this test can be used) for the duration of the COVID-19 declaration under Section 564(b)(1) of the Act, 21 U.S.C. section 360bbb-3(b)(1), unless the authorization is terminated or revoked sooner. Performed at [facility], [place] [address], [place], [place] [address]   MRSA PCR Screening     Status: None   Collection Time: 07/22/19 11:44 PM   Specimen: Nasopharyngeal  Result Value Ref Range  Status   MRSA by PCR NEGATIVE NEGATIVE Final    Comment:        The GeneXpert MRSA Assay (FDA approved for NASAL specimens only), is one component of a comprehensive MRSA colonization surveillance program. It is not intended to diagnose MRSA infection nor to guide or monitor treatment for MRSA infections. Performed at [facility], [place] [address] [place], [place] [address]          Radiology Studies: NM GASTRIC EMPTYING  Result Date: 07/26/2019 CLINICAL DATA:  Nausea and vomiting EXAM: NUCLEAR MEDICINE GASTRIC EMPTYING SCAN TECHNIQUE: After oral ingestion of radiolabeled meal, sequential abdominal images were obtained for 4 hours. Percentage of activity emptying the stomach was calculated at 1 hour, 2 hour, 3 hour, and 4 hours. RADIOPHARMACEUTICALS:  1.9 mCi Tc-8m sulfur colloid in standardized meal COMPARISON:  CT abdomen pelvis 07/22/2019 FINDINGS: Expected location of the stomach in the left upper quadrant. Ingested meal empties the stomach gradually over the course of the study. 1.5% emptied at 1 hr ( normal >= 10%) 6.3% emptied at 2 hr ( normal >= 40%) 16% emptied at 3 hr ( normal >= 70%) 46% emptied at 4 hr ( normal >= 90%) IMPRESSION: Delayed gastric emptying study. Electronically Signed   By: Davina Poke D.O.   On: 07/26/2019 13:53        Scheduled Meds: . sodium chloride   Intravenous Once  . apixaban  5 mg Oral BID  . Chlorhexidine Gluconate Cloth  6 each Topical Daily  . DULoxetine  40 mg Oral Daily  . erythromycin  250  mg Oral TID WC & HS  . hydrALAZINE  5 mg Intravenous Q6H  . insulin aspart  0-15 Units Subcutaneous Q4H  . insulin aspart  5 Units Subcutaneous TID WC  . insulin glargine  35 Units Subcutaneous Daily  . isosorbide mononitrate  60 mg Oral Daily  . metoprolol succinate  50 mg Oral Daily  . pantoprazole  40 mg Oral Daily   Continuous Infusions: . sodium chloride 10 mL/hr at 07/26/19 1800  . sodium chloride 75 mL/hr at  07/25/19 1327     LOS: 5 days    Time spent:25 mins. More than 50% of that time was spent in counseling and/or coordination of care.      Shelly Coss, MD [facility] P5/21/2021, 7:40 AM

## 2019-07-27 NOTE — Transfer of Care (Signed)
Immediate Anesthesia Transfer of Care Note  Patient: Deborah [name]  Procedure(s) Performed: ESOPHAGOGASTRODUODENOSCOPY (EGD) (N/A ) BIOPSY  Patient Location: PACU  Anesthesia Type:MAC  Level of Consciousness: awake and patient cooperative  Airway & Oxygen Therapy: Patient Spontanous Breathing and Patient connected to face mask  Post-op Assessment: Report given to RN and Post -op Vital signs reviewed and stable  Post vital signs: Reviewed and stable  Last Vitals:  Vitals Value Taken Time  BP 104/41 07/27/19 1444  Temp    Pulse 85 07/27/19 1445  Resp 32 07/27/19 1445  SpO2 100 % 07/27/19 1445  Vitals shown include unvalidated device data.  Last Pain:  Vitals:   07/27/19 1334  TempSrc: Oral  PainSc: 7       Patients Stated Pain Goal: 0 (96/43/83 8184)  Complications: No apparent anesthesia complications

## 2019-07-28 LAB — CBC WITH DIFFERENTIAL/PLATELET
Abs Immature Granulocytes: 0.04 10*3/uL (ref 0.00–0.07)
Basophils Absolute: 0.1 10*3/uL (ref 0.0–0.1)
Basophils Relative: 1 %
Eosinophils Absolute: 0.3 10*3/uL (ref 0.0–0.5)
Eosinophils Relative: 3 %
HCT: 25 % — ABNORMAL LOW (ref 36.0–46.0)
Hemoglobin: 7.5 g/dL — ABNORMAL LOW (ref 12.0–15.0)
Immature Granulocytes: 0 %
Lymphocytes Relative: 23 %
Lymphs Abs: 2.5 10*3/uL (ref 0.7–4.0)
MCH: 27 pg (ref 26.0–34.0)
MCHC: 30 g/dL (ref 30.0–36.0)
MCV: 89.9 fL (ref 80.0–100.0)
Monocytes Absolute: 0.8 10*3/uL (ref 0.1–1.0)
Monocytes Relative: 8 %
Neutro Abs: 7.1 10*3/uL (ref 1.7–7.7)
Neutrophils Relative %: 65 %
Platelets: 376 10*3/uL (ref 150–400)
RBC: 2.78 MIL/uL — ABNORMAL LOW (ref 3.87–5.11)
RDW: 15.8 % — ABNORMAL HIGH (ref 11.5–15.5)
WBC: 10.8 10*3/uL — ABNORMAL HIGH (ref 4.0–10.5)
nRBC: 0 % (ref 0.0–0.2)

## 2019-07-28 LAB — BASIC METABOLIC PANEL
Anion gap: 6 (ref 5–15)
BUN: 21 mg/dL — ABNORMAL HIGH (ref 6–20)
CO2: 24 mmol/L (ref 22–32)
Calcium: 8.4 mg/dL — ABNORMAL LOW (ref 8.9–10.3)
Chloride: 108 mmol/L (ref 98–111)
Creatinine, Ser: 1.14 mg/dL — ABNORMAL HIGH (ref 0.44–1.00)
GFR calc Af Amer: >60 mL/min (ref >60)
GFR calc non Af Amer: 54 mL/min — ABNORMAL LOW (ref >60)
Glucose, Bld: 186 mg/dL — ABNORMAL HIGH (ref 70–99)
Potassium: 3.4 mmol/L — ABNORMAL LOW (ref 3.5–5.1)
Sodium: 138 mmol/L (ref 135–145)

## 2019-07-28 LAB — GLUCOSE, CAPILLARY
Glucose-Capillary: 194 mg/dL — ABNORMAL HIGH (ref 70–99)
Glucose-Capillary: 182 mg/dL — ABNORMAL HIGH (ref 70–99)
Glucose-Capillary: 190 mg/dL — ABNORMAL HIGH (ref 70–99)
Glucose-Capillary: 331 mg/dL — ABNORMAL HIGH (ref 70–99)
Glucose-Capillary: 223 mg/dL — ABNORMAL HIGH (ref 70–99)

## 2019-07-28 MED ORDER — POTASSIUM CHLORIDE CRYS ER 20 MEQ PO TBCR
40.0000 meq | EXTENDED_RELEASE_TABLET | Freq: Once | ORAL | Status: AC
  Administered 2019-07-28: 40 meq via ORAL
  Filled 2019-07-28: qty 2

## 2019-07-28 MED ORDER — MORPHINE SULFATE (PF) 2 MG/ML IV SOLN
2.0000 mg | INTRAVENOUS | Status: DC | PRN
  Administered 2019-07-28 – 2019-07-30 (×7): 2 mg via INTRAVENOUS
  Filled 2019-07-28 (×7): qty 1

## 2019-07-28 NOTE — Progress Notes (Signed)
The patient is doing well today.  Her chart was reviewed.  Given her unexplained iron deficiency anemia colonoscopy was recommended and we will schedule this for Monday.

## 2019-07-28 NOTE — Progress Notes (Signed)
PROGRESS NOTE    Deborah [name]  KVQ:[id number] DOB: [birth year] DOA: 07/21/2019 PCP: [name], DO   Brief Narrative: Patient is a 54 year old female with past medical history of depression, nonhemorrhagic CVA, chronic systolic congestive heart failure, paroxysmal A. fib on Eliquis, diabetes type 2, recurrent admission for DKA, diabetic gastroparesis, CKD stage IIIa, hypertension, gout, gastric ulcer, GERD, anemia of chronic disease who presented to the emergency room with complaints of nausea, vomiting, diffuse abdominal pain.  She was recently admitted here for the management of exacerbation of gastroparesis earlier this month.  GI has been consulted and following.   On presentation she was tachycardic, tachypneic with lactic acidosis. Underwent gastric emptying study which came out to be positive.  Underwent EGD  to rule out gastric outlet obstruction but it did not show any abnormal findings.  GI planning for colonoscopy on Monday.  Assessment & Plan:   Active Problems:   Essential hypertension, benign   Uncontrolled type 2 diabetes mellitus with diabetic neuropathy, with long-term current use of insulin (HCC)   CKD (chronic kidney disease), stage II   Diabetic gastroparesis (HCC)   Morbid obesity (HCC)   Anxiety   Intractable nausea and vomiting   Chronic systolic CHF (congestive heart failure) ([place])   Uncontrolled type 2 diabetes mellitus with hyperglycemia ([place])   Hyperosmolar hyperglycemic state (HHS) ([place])   Drug-seeking behavior   Abdominal pain/gastroparesis: Exacerbation of gastroparesis is likely secondary to her uncontrolled diabetes.  She has been admitted multiple times for the same problem.  On erythromycin 3 times daily.  She has history of noncompliance.    GI consulted and she underwent  gastric emptying test .  Test came out to be positive.Underwent EGD on 07/27/19 to rule out gastric outlet obstruction but it did not show any abnormal findings.  GI planning  for colonoscopy on Monday.  Suspected sepsis: Sepsis ruled out.  Antibiotics discontinued.  She was tachypneic, tachycardic with lactic acidosis on presentation.  Elevated white cell count on presentation.  Lactate is normalized.  Procalcitonin negative.  Chronic systolic CHF: Continue input/output monitoring, daily weight.  She has history of noncompliance.  She has not been following with her cardiologist.  Case was discussed with cardiology and she has been arranging outpatient follow-up.  Currently on Toprol, hydralazine.  Echo showed ejection fraction 45 to 50%, global hypokinesis, moderately elevated pulmonary artery systolic pressure.  The findings are similar to the previous echo on 11/20. She looked dehydrated,so she was given  IV fluids.  We will stop the fluids today. She takes torsemide at home which is on hold due to AKI.  Hypertension: Currently blood pressure stable.  Continue current medications  Paroxysmal  A. fib: On Eliquis for anticoagulation.  On Toprol for rate control.  AKI on CKD stage IIIa: Baseline creatinine 1.3.  Creatinine creeped up to the level of 2.1 .  Improved with IV fluids. Torsemide has been on hold due to AKI.  Type 2 diabetes mellitus: Recent hemoglobin A1c of 8.4.  Continue Lantus and sliding scale insulin along with NovoLog.  Diabetic coordinator  has been following.  History of nonhemorrhagic CVA: Continue current medications.  Hyperlipidemia: On statin at home.  Morbid obesity: BMI 51.6.  Hypokalemia: Supplemented with potassium  Normocytic anemia: Hemoglobin dropped to  7.1 at one point .Iron level low normal.  No evidence of acute blood loss.  Could be associated with her CKD.  Transfused with a unit of PRBC during this hospitalization.  Hemoglobin in the range  of 7.  GI planning for colonoscopy  Tardive dyskinesia: Has involuntary movement of her tongue and mouth consistent with tardive dyskinesia.  Could be associated with Reglan that she was  using in the past.  Continue supportive care, avoid Reglan as much as possible         DVT prophylaxis:Eliquis Code Status: Full Family Communication: Called and discussed with mother on phone 07/27/19 Status is: Inpatient  Remains inpatient appropriate because:Ongoing diagnostic testing needed not appropriate for outpatient work up   Dispo: The patient is from: Home              Anticipated d/c is to: Home              Anticipated d/c date is: 1-2 days              Patient currently is not medically stable to d/c. Needs GI clearance before discharge.  GI planning for colonoscopy   Consultants: GI  Procedures:None  Antimicrobials:  Anti-infectives (From admission, onward)   Start     Dose/Rate Route Frequency Ordered Stop   07/26/19 1700  erythromycin (E-MYCIN) tablet 250 mg     250 mg Oral 3 times daily with meals & bedtime 07/26/19 1312     07/24/19 1700  erythromycin (E-MYCIN) tablet 250 mg  Status:  Discontinued     250 mg Oral 3 times daily with meals & bedtime 07/24/19 1205 07/25/19 0838   07/22/19 0800  erythromycin (E-MYCIN) tablet 250 mg  Status:  Discontinued     250 mg Oral 3 times daily with meals & bedtime 07/22/19 0723 07/23/19 1507   07/22/19 0800  ciprofloxacin (CIPRO) IVPB 400 mg  Status:  Discontinued     400 mg 200 mL/hr over 60 Minutes Intravenous Every 12 hours 07/22/19 0734 07/23/19 0833   07/22/19 0800  metroNIDAZOLE (FLAGYL) IVPB 500 mg  Status:  Discontinued     500 mg 100 mL/hr over 60 Minutes Intravenous Every 8 hours 07/22/19 0734 07/23/19 0833      Subjective: Patient seen and examined the bedside this morning. Hemodynamically stable. Lying on the bed. Continues to complain of abdominal discomfort today. Has been having bowel movements. No nausea or vomiting.  Objective: Vitals:   07/27/19 1456 07/27/19 1534 07/27/19 2047 07/28/19 0540  BP: (!) 133/54 (!) 141/84 (!) 144/69 130/70  Pulse: 86 90 (!) 104 100  Resp: 19 13 20 18   Temp:  (!)  97.5 F (36.4 C) 98.2 F (36.8 C) 98.1 F (36.7 C)  TempSrc:  Oral Oral Oral  SpO2: 97% 100% 100% 100%  Weight:      Height:        Intake/Output Summary (Last 24 hours) at 07/28/2019 0747 Last data filed at 07/28/2019 0350 Gross per 24 hour  Intake 330 ml  Output --  Net 330 ml   Filed Weights   07/24/19 0600 07/26/19 0500 07/27/19 1334  Weight: (!) 145.2 kg (!) 150.8 kg (!) 150 kg    Examination:  General exam: Overall comfortable, complained of abdominal pain upon asking. Morbidly obese Respiratory system: Bilateral equal air entry, normal vesicular breath sounds, no wheezes or crackles  Cardiovascular system: S1 & S2 heard, RRR. No JVD, murmurs, rubs, gallops or clicks. Gastrointestinal system: Abdomen is nondistended, soft and nontender. No organomegaly or masses felt. Normal bowel sounds heard. Central nervous system: Alert and oriented. No focal neurological deficits. Extremities: Trace bilateral lower extremity edema, no clubbing ,no cyanosis, distal peripheral pulses palpable. Skin:  No rashes, lesions or ulcers,no icterus ,no pallor   Data Reviewed: I have personally reviewed following labs and imaging studies  CBC: Recent Labs  Lab 07/22/19 2016 07/23/19 0122 07/24/19 0253 07/25/19 0204 07/26/19 0239 07/27/19 0401 07/28/19 0349  WBC 18.1*   < > 15.2* 13.0* 11.4* 12.0* 10.8*  NEUTROABS 14.7*  --   --  8.5* 7.2 8.2* 7.1  HGB 9.0*   < > 7.8* 7.7* 7.1* 7.6*  7.7* 7.5*  HCT 30.0*   < > 26.8* 26.4* 23.1* 25.4*  25.4* 25.0*  MCV 89.8   < > 91.5 90.4 88.8 88.8 89.9  PLT 482*   < > 386 410* 394 365 376   < > = values in this interval not displayed.   Basic Metabolic Panel: Recent Labs  Lab 07/22/19 1905 07/23/19 0122 07/24/19 0253 07/25/19 0204 07/26/19 0239 07/27/19 0401 07/28/19 0349  NA 139   < > 138 135 134* 135 138  K 3.6   < > 3.6 3.2* 3.3* 3.6 3.4*  CL 99   < > 102 99 100 104 108  CO2 27   < > 27 22 26 26 24   GLUCOSE 165*   < > 193* 297* 165*  163* 186*  BUN 19   < > 14 21* 23* 25* 21*  CREATININE 1.86*   < > 1.51* 2.10* 1.71* 1.34* 1.14*  CALCIUM 8.4*   < > 8.2* 8.8* 8.4* 8.6* 8.4*  MG 1.5*  --  1.7 1.8  --   --   --   PHOS 3.1  --  3.1 2.5  --   --   --    < > = values in this interval not displayed.   GFR: Estimated Creatinine Clearance: 84.2 mL/min (A) (by C-G formula based on SCr of 1.14 mg/dL (H)). Liver Function Tests: Recent Labs  Lab 07/23/19 0122 07/24/19 0253 07/25/19 0204 07/26/19 0239 07/27/19 0401  AST 11* 11* 12* 12* 10*  ALT 11 8 9 9 9   ALKPHOS 81 79 91 85 84  BILITOT 0.6 0.8 0.5 0.5 0.8  PROT 6.6 6.4* 6.5 6.3* 6.1*  ALBUMIN 3.1* 3.0* 3.1* 2.9* 2.8*   Recent Labs  Lab 07/21/19 2335  LIPASE 23   No results for input(s): AMMONIA in the last 168 hours. Coagulation Profile: No results for input(s): INR, PROTIME in the last 168 hours. Cardiac Enzymes: No results for input(s): CKTOTAL, CKMB, CKMBINDEX, TROPONINI in the last 168 hours. BNP (last 3 results) No results for input(s): PROBNP in the last 8760 hours. HbA1C: No results for input(s): HGBA1C in the last 72 hours. CBG: Recent Labs  Lab 07/27/19 1651 07/27/19 2210 07/27/19 2333 07/28/19 0540 07/28/19 0735  GLUCAP 181* 222* 212* 194* 182*   Lipid Profile: No results for input(s): CHOL, HDL, LDLCALC, TRIG, CHOLHDL, LDLDIRECT in the last 72 hours. Thyroid Function Tests: No results for input(s): TSH, T4TOTAL, FREET4, T3FREE, THYROIDAB in the last 72 hours. Anemia Panel: Recent Labs    07/26/19 0239  FERRITIN 53  TIBC 372  IRON 31   Sepsis Labs: Recent Labs  Lab 07/22/19 1906 07/23/19 0615 07/24/19 1004 07/24/19 1354 07/25/19 0204  PROCALCITON  --   --  <0.10  --  <0.10  LATICACIDVEN 3.2* 0.9 0.9 2.5*  --     Recent Results (from the past 240 hour(s))  Urine culture     Status: Abnormal   Collection Time: 07/21/19 11:36 PM   Specimen: Urine, Clean Catch  Result Value Ref  Range Status   Specimen Description   Final     URINE, CLEAN CATCH Performed at [facility], [place] [address], [place], [place] [address]    Special Requests   Final    NONE Performed at [facility], [place] [address] [place], [place] [address]    Culture MULTIPLE SPECIES PRESENT, SUGGEST RECOLLECTION (A)  Final   Report Status 07/23/2019 FINAL  Final  SARS Coronavirus 2 by RT PCR (hospital order, performed in [facility] hospital lab) Nasopharyngeal Nasopharyngeal Swab     Status: None   Collection Time: 07/22/19 11:11 PM   Specimen: Nasopharyngeal Swab  Result Value Ref Range Status   SARS Coronavirus 2 NEGATIVE NEGATIVE Final    Comment: (NOTE) SARS-CoV-2 target nucleic acids are NOT DETECTED. The SARS-CoV-2 RNA is generally detectable in upper and lower respiratory specimens during the acute phase of infection. The lowest concentration of SARS-CoV-2 viral copies this assay can detect is 250 copies / mL. A negative result does not preclude SARS-CoV-2 infection and should not be used as the sole basis for treatment or other patient management decisions.  A negative result may occur with improper specimen collection / handling, submission of specimen other than nasopharyngeal swab, presence of viral mutation(s) within the areas targeted by this assay, and inadequate number of viral copies (<250 copies / mL). A negative result must be combined with clinical observations, patient history, and epidemiological information. Fact Sheet for Patients:   StrictlyIdeas.no Fact Sheet for Healthcare Providers: BankingDealers.co.za This test is not yet approved or cleared  by the [place] FDA and has been authorized for detection and/or diagnosis of SARS-CoV-2 by FDA under an Emergency Use Authorization (EUA).  This EUA will remain in effect (meaning this test can be used) for the duration of the COVID-19 declaration under Section 564(b)(1) of the Act, 21  U.S.C. section 360bbb-3(b)(1), unless the authorization is terminated or revoked sooner. Performed at [facility], [place] [address], [place], [place] [address]   MRSA PCR Screening     Status: None   Collection Time: 07/22/19 11:44 PM   Specimen: Nasopharyngeal  Result Value Ref Range Status   MRSA by PCR NEGATIVE NEGATIVE Final    Comment:        The GeneXpert MRSA Assay (FDA approved for NASAL specimens only), is one component of a comprehensive MRSA colonization surveillance program. It is not intended to diagnose MRSA infection nor to guide or monitor treatment for MRSA infections. Performed at [facility], [place] [address], [place], [place] [address]          Radiology Studies: NM GASTRIC EMPTYING  Result Date: 07/26/2019 CLINICAL DATA:  Nausea and vomiting EXAM: NUCLEAR MEDICINE GASTRIC EMPTYING SCAN TECHNIQUE: After oral ingestion of radiolabeled meal, sequential abdominal images were obtained for 4 hours. Percentage of activity emptying the stomach was calculated at 1 hour, 2 hour, 3 hour, and 4 hours. RADIOPHARMACEUTICALS:  1.9 mCi Tc-41m sulfur colloid in standardized meal COMPARISON:  CT abdomen pelvis 07/22/2019 FINDINGS: Expected location of the stomach in the left upper quadrant. Ingested meal empties the stomach gradually over the course of the study. 1.5% emptied at 1 hr ( normal >= 10%) 6.3% emptied at 2 hr ( normal >= 40%) 16% emptied at 3 hr ( normal >= 70%) 46% emptied at 4 hr ( normal >= 90%) IMPRESSION: Delayed gastric emptying study. Electronically Signed   By: Davina Poke D.O.   On: 07/26/2019 13:53  Scheduled Meds: . sodium chloride   Intravenous Once  . apixaban  5 mg Oral BID  . Chlorhexidine Gluconate Cloth  6 each Topical Daily  . DULoxetine  40 mg Oral Daily  . erythromycin  250 mg Oral TID WC & HS  . hydrALAZINE  5 mg Intravenous Q6H  . insulin aspart  0-15 Units Subcutaneous Q4H  . insulin  aspart  5 Units Subcutaneous TID WC  . insulin glargine  35 Units Subcutaneous Daily  . isosorbide mononitrate  60 mg Oral Daily  . metoprolol succinate  50 mg Oral Daily  . pantoprazole  40 mg Oral Daily  . potassium chloride  40 mEq Oral Once   Continuous Infusions: . sodium chloride 10 mL/hr at 07/26/19 1800     LOS: 6 days    Time spent:25 mins. More than 50% of that time was spent in counseling and/or coordination of care.      Shelly Coss, MD [facility] P5/22/2021, 7:47 AM

## 2019-07-29 LAB — BASIC METABOLIC PANEL
Anion gap: 9 (ref 5–15)
BUN: 23 mg/dL — ABNORMAL HIGH (ref 6–20)
CO2: 22 mmol/L (ref 22–32)
Calcium: 8.6 mg/dL — ABNORMAL LOW (ref 8.9–10.3)
Chloride: 108 mmol/L (ref 98–111)
Creatinine, Ser: 1.07 mg/dL — ABNORMAL HIGH (ref 0.44–1.00)
GFR calc Af Amer: >60 mL/min (ref >60)
GFR calc non Af Amer: 58 mL/min — ABNORMAL LOW (ref >60)
Glucose, Bld: 234 mg/dL — ABNORMAL HIGH (ref 70–99)
Potassium: 3.9 mmol/L (ref 3.5–5.1)
Sodium: 139 mmol/L (ref 135–145)

## 2019-07-29 LAB — GLUCOSE, CAPILLARY
Glucose-Capillary: 100 mg/dL — ABNORMAL HIGH (ref 70–99)
Glucose-Capillary: 141 mg/dL — ABNORMAL HIGH (ref 70–99)
Glucose-Capillary: 187 mg/dL — ABNORMAL HIGH (ref 70–99)
Glucose-Capillary: 217 mg/dL — ABNORMAL HIGH (ref 70–99)
Glucose-Capillary: 224 mg/dL — ABNORMAL HIGH (ref 70–99)
Glucose-Capillary: 262 mg/dL — ABNORMAL HIGH (ref 70–99)

## 2019-07-29 LAB — CBC WITH DIFFERENTIAL/PLATELET
Abs Immature Granulocytes: 0.04 10*3/uL (ref 0.00–0.07)
Basophils Absolute: 0.1 10*3/uL (ref 0.0–0.1)
Basophils Relative: 1 %
Eosinophils Absolute: 0.3 10*3/uL (ref 0.0–0.5)
Eosinophils Relative: 3 %
HCT: 24.9 % — ABNORMAL LOW (ref 36.0–46.0)
Hemoglobin: 7.3 g/dL — ABNORMAL LOW (ref 12.0–15.0)
Immature Granulocytes: 0 %
Lymphocytes Relative: 26 %
Lymphs Abs: 2.5 10*3/uL (ref 0.7–4.0)
MCH: 26.9 pg (ref 26.0–34.0)
MCHC: 29.3 g/dL — ABNORMAL LOW (ref 30.0–36.0)
MCV: 91.9 fL (ref 80.0–100.0)
Monocytes Absolute: 0.8 10*3/uL (ref 0.1–1.0)
Monocytes Relative: 8 %
Neutro Abs: 5.9 10*3/uL (ref 1.7–7.7)
Neutrophils Relative %: 62 %
Platelets: 379 10*3/uL (ref 150–400)
RBC: 2.71 MIL/uL — ABNORMAL LOW (ref 3.87–5.11)
RDW: 15.7 % — ABNORMAL HIGH (ref 11.5–15.5)
WBC: 9.6 10*3/uL (ref 4.0–10.5)
nRBC: 0 % (ref 0.0–0.2)

## 2019-07-29 MED ORDER — MAGNESIUM CITRATE PO SOLN
240.0000 mL | Freq: Once | ORAL | Status: AC
  Administered 2019-07-29: 240 mL via ORAL

## 2019-07-29 MED ORDER — PEG 3350-KCL-NA BICARB-NACL 420 G PO SOLR
4000.0000 mL | Freq: Once | ORAL | Status: AC
  Administered 2019-07-29: 4000 mL via ORAL

## 2019-07-29 NOTE — Progress Notes (Signed)
Patient unable to tolerate nulytely even after giving zofran, Dr. Penelope Coop notified, order given for 8oz of magnesium citrate.

## 2019-07-29 NOTE — Progress Notes (Signed)
PT Cancellation Note  Patient Details Name: Deborah [name] MRN: [id number] DOB: 12-01-1965   Cancelled Treatment:    Reason Eval/Treat Not Completed: Patient declined, no reason specified. Pt refused PT, but stated we could attempt again tomorrow. Per chart review, pt has history of declining PT services.   Galen Manila 07/29/2019, 9:21 AM

## 2019-07-29 NOTE — Progress Notes (Addendum)
Patient ID: Deborah [name], female   DOB: 07-Jan-1966, 54 y.o.   MRN: [id number]  PROGRESS NOTE    Deborah [name]  KKX:[id number] DOB: 1965-05-29 DOA: 07/21/2019 PCP: [name], DO    Brief Narrative:  Patient is a 54 year old female with past medical history of depression, nonhemorrhagic CVA, chronic systolic congestive heart failure, paroxysmal A. fib on Eliquis, diabetes type 2, recurrent admission for DKA, diabetic gastroparesis, CKD stage IIIa, hypertension, gout, gastric ulcer, GERD, anemia of chronic disease who presented to the emergency room with complaints of nausea, vomiting, diffuse abdominal pain.  She was recently admitted here for the management of exacerbation of gastroparesis earlier this month.  GI has been consulted and following.   On presentation she was tachycardic, tachypneic with lactic acidosis. Underwent gastric emptying study which came out to be positive.  Underwent EGD  to rule out gastric outlet obstruction but it did not show any abnormal findings.  GI planning for colonoscopy on Monday.   Assessment & Plan:   Active Problems:   Essential hypertension, benign   Uncontrolled type 2 diabetes mellitus with diabetic neuropathy, with long-term current use of insulin (HCC)   CKD (chronic kidney disease), stage II   Diabetic gastroparesis (HCC)   Morbid obesity (HCC)   Anxiety   Intractable nausea and vomiting   Chronic systolic CHF (congestive heart failure) ([place])   Uncontrolled type 2 diabetes mellitus with hyperglycemia ([place])   Hyperosmolar hyperglycemic state (HHS) ([place])   Drug-seeking behavior  Abdominal pain/gastroparesis: Exacerbation of gastroparesis is likely secondary to her uncontrolled diabetes. She has been admitted multiple times for the same problem. On erythromycin 3 times daily.  She has history of noncompliance.  GI consulted and she underwent  gastric emptying test. Test came out to be positive.Underwent EGD on 07/27/19 to rule out  gastric outlet obstruction but it did not show any abnormal findings. Discussed dietary changes. GI planning for colonoscopy on Monday.  Suspected sepsis: Sepsis ruled out.  Antibiotics discontinued.  She was tachypneic, tachycardic with lactic acidosis on presentation.  Elevated white cell count on presentation.  Lactate is normalized.  Procalcitonin negative.  Chronic systolic CHF: Continue input/output monitoring, daily weight.  She has history of noncompliance.  She has not been following with her cardiologist.  Case was discussed with cardiology and she has been arranging outpatient follow-up.  Currently on Toprol, hydralazine.  Echo showed ejection fraction 45 to 50%, global hypokinesis, moderately elevated pulmonary artery systolic pressure.  The findings are similar to the previous echo on 11/20. She looked dehydrated,so she was given  IV fluids, now d/c'd She takes torsemide at home which is on hold due to AKI.  Hypertension: Currently blood pressure stable. Continue current medications  Paroxysmal  A. fib: On Eliquis for anticoagulation. On Toprol for rate control.  AKI on CKD stage IIIa: Baseline creatinine 1.3. Creatinine creeped up to the level of 2.1. Improved with IV fluids--down to 1.07 this am. Torsemide has been on hold due to AKI.  Type 2 diabetes mellitus: Recent hemoglobin A1c of 8.4. Continue Lantus and sliding scale insulin along with NovoLog. Diabetic coordinator  has been following. Needs improved glycemic control, however only on clears today for prep so not adjustments made  History of nonhemorrhagic CVA: Continue current medications.  Hyperlipidemia: On statin at home.  Morbid obesity: BMI 51.6.  Hypokalemia: Supplemented with potassium  Normocytic anemia: Hemoglobin dropped to  7.1 at one point .Iron level low normal.  No evidence of acute blood loss.  Could be associated with her CKD.  Transfused with a unit of PRBC during this hospitalization.   Hemoglobin in the range of 7.  GI planning for colonoscopy  Tardive dyskinesia: Has involuntary movement of her tongue and mouth consistent with tardive dyskinesia.  Could be associated with Reglan that she was using in the past.  Continue supportive care, avoid Reglan as much as possible  DVT prophylaxis: FU:XNATFTD Code Status: Full code  Family Communication: Discussed with mother on 07/27/19 Disposition Plan: home  Anticipated d/c date is: 1-2 days. Patient currently is not medically stable to d/c. Needs GI clearance before discharge.  GI planning for colonoscopy  Consultants:   GI  Procedures:  Gastric emptying study  Antimicrobials: Anti-infectives (From admission, onward)   Start     Dose/Rate Route Frequency Ordered Stop   07/26/19 1700  erythromycin (E-MYCIN) tablet 250 mg     250 mg Oral 3 times daily with meals & bedtime 07/26/19 1312     07/24/19 1700  erythromycin (E-MYCIN) tablet 250 mg  Status:  Discontinued     250 mg Oral 3 times daily with meals & bedtime 07/24/19 1205 07/25/19 0838   07/22/19 0800  erythromycin (E-MYCIN) tablet 250 mg  Status:  Discontinued     250 mg Oral 3 times daily with meals & bedtime 07/22/19 0723 07/23/19 1507   07/22/19 0800  ciprofloxacin (CIPRO) IVPB 400 mg  Status:  Discontinued     400 mg 200 mL/hr over 60 Minutes Intravenous Every 12 hours 07/22/19 0734 07/23/19 0833   07/22/19 0800  metroNIDAZOLE (FLAGYL) IVPB 500 mg  Status:  Discontinued     500 mg 100 mL/hr over 60 Minutes Intravenous Every 8 hours 07/22/19 0734 07/23/19 0833       Subjective: Feels ok today, better than admission. On clears and for prep today--  Objective: Vitals:   07/29/19 0023 07/29/19 0431 07/29/19 0500 07/29/19 1221  BP: 125/74 120/60  132/78  Pulse: 98 100  88  Resp: 19 18  (!) 22  Temp: 98.1 F (36.7 C) (!) 97.4 F (36.3 C)  98.4 F (36.9 C)  TempSrc: Oral Oral  Oral  SpO2: 100% 99%  99%  Weight:   (!) 155.2 kg   Height:         Intake/Output Summary (Last 24 hours) at 07/29/2019 1243 Last data filed at 07/28/2019 1500 Gross per 24 hour  Intake 240 ml  Output --  Net 240 ml   Filed Weights   07/26/19 0500 07/27/19 1334 07/29/19 0500  Weight: (!) 150.8 kg (!) 150 kg (!) 155.2 kg    Examination:  General exam: Appears calm and comfortable  Respiratory system: Clear to auscultation. Respiratory effort normal. Cardiovascular system: S1 & S2 heard, RRR.  Gastrointestinal system: Abdomen is nondistended, soft and nontender.  Central nervous system: Alert and oriented. No focal neurological deficits. Extremities: Symmetric  Skin: No rashes  Data Reviewed: I have personally reviewed following labs and imaging studies  CBC: Recent Labs  Lab 07/25/19 0204 07/26/19 0239 07/27/19 0401 07/28/19 0349 07/29/19 0400  WBC 13.0* 11.4* 12.0* 10.8* 9.6  NEUTROABS 8.5* 7.2 8.2* 7.1 5.9  HGB 7.7* 7.1* 7.6*  7.7* 7.5* 7.3*  HCT 26.4* 23.1* 25.4*  25.4* 25.0* 24.9*  MCV 90.4 88.8 88.8 89.9 91.9  PLT 410* 394 365 376 322   Basic Metabolic Panel: Recent Labs  Lab 07/22/19 1905 07/23/19 0122 07/24/19 0253 07/24/19 0253 07/25/19 0254 07/26/19 0239 07/27/19 0401 07/28/19 0349 07/29/19  0400  NA 139   < > 138   < > 135 134* 135 138 139  K 3.6   < > 3.6   < > 3.2* 3.3* 3.6 3.4* 3.9  CL 99   < > 102   < > 99 100 104 108 108  CO2 27   < > 27   < > 22 26 26 24 22   GLUCOSE 165*   < > 193*   < > 297* 165* 163* 186* 234*  BUN 19   < > 14   < > 21* 23* 25* 21* 23*  CREATININE 1.86*   < > 1.51*   < > 2.10* 1.71* 1.34* 1.14* 1.07*  CALCIUM 8.4*   < > 8.2*   < > 8.8* 8.4* 8.6* 8.4* 8.6*  MG 1.5*  --  1.7  --  1.8  --   --   --   --   PHOS 3.1  --  3.1  --  2.5  --   --   --   --    < > = values in this interval not displayed.   GFR: Estimated Creatinine Clearance: 91.6 mL/min (A) (by C-G formula based on SCr of 1.07 mg/dL (H)). Liver Function Tests: Recent Labs  Lab 07/23/19 0122 07/24/19 0253 07/25/19 0204  07/26/19 0239 07/27/19 0401  AST 11* 11* 12* 12* 10*  ALT 11 8 9 9 9   ALKPHOS 81 79 91 85 84  BILITOT 0.6 0.8 0.5 0.5 0.8  PROT 6.6 6.4* 6.5 6.3* 6.1*  ALBUMIN 3.1* 3.0* 3.1* 2.9* 2.8*   CBG: Recent Labs  Lab 07/28/19 2010 07/29/19 0014 07/29/19 0427 07/29/19 0741 07/29/19 1217  GLUCAP 223* 262* 224* 217* 187*   Sepsis Labs: Recent Labs  Lab 07/22/19 1906 07/23/19 0615 07/24/19 1004 07/24/19 1354 07/25/19 0204  PROCALCITON  --   --  <0.10  --  <0.10  LATICACIDVEN 3.2* 0.9 0.9 2.5*  --     Recent Results (from the past 240 hour(s))  Urine culture     Status: Abnormal   Collection Time: 07/21/19 11:36 PM   Specimen: Urine, Clean Catch  Result Value Ref Range Status   Specimen Description   Final    URINE, CLEAN CATCH Performed at [facility], [place] [address] [place], [place] [address]    Special Requests   Final    NONE Performed at [facility], [place] [address] [place], [place] [address]    Culture MULTIPLE SPECIES PRESENT, SUGGEST RECOLLECTION (A)  Final   Report Status 07/23/2019 FINAL  Final  SARS Coronavirus 2 by RT PCR (hospital order, performed in [facility] hospital lab) Nasopharyngeal Nasopharyngeal Swab     Status: None   Collection Time: 07/22/19 11:11 PM   Specimen: Nasopharyngeal Swab  Result Value Ref Range Status   SARS Coronavirus 2 NEGATIVE NEGATIVE Final    Comment: (NOTE) SARS-CoV-2 target nucleic acids are NOT DETECTED. The SARS-CoV-2 RNA is generally detectable in upper and lower respiratory specimens during the acute phase of infection. The lowest concentration of SARS-CoV-2 viral copies this assay can detect is 250 copies / mL. A negative result does not preclude SARS-CoV-2 infection and should not be used as the sole basis for treatment or other patient management decisions.  A negative result may occur with improper specimen collection / handling, submission of specimen other than  nasopharyngeal swab, presence of viral mutation(s) within the areas targeted by this assay, and inadequate number  of viral copies (<250 copies / mL). A negative result must be combined with clinical observations, patient history, and epidemiological information. Fact Sheet for Patients:   StrictlyIdeas.no Fact Sheet for Healthcare Providers: BankingDealers.co.za This test is not yet approved or cleared  by the [place] FDA and has been authorized for detection and/or diagnosis of SARS-CoV-2 by FDA under an Emergency Use Authorization (EUA).  This EUA will remain in effect (meaning this test can be used) for the duration of the COVID-19 declaration under Section 564(b)(1) of the Act, 21 U.S.C. section 360bbb-3(b)(1), unless the authorization is terminated or revoked sooner. Performed at [facility], [place] [address], [place], [place] [address]   MRSA PCR Screening     Status: None   Collection Time: 07/22/19 11:44 PM   Specimen: Nasopharyngeal  Result Value Ref Range Status   MRSA by PCR NEGATIVE NEGATIVE Final    Comment:        The GeneXpert MRSA Assay (FDA approved for NASAL specimens only), is one component of a comprehensive MRSA colonization surveillance program. It is not intended to diagnose MRSA infection nor to guide or monitor treatment for MRSA infections. Performed at [facility], [place] [address], [place], [place] [address]       Radiology Studies: No results found.   Scheduled Meds: . sodium chloride   Intravenous Once  . apixaban  5 mg Oral BID  . Chlorhexidine Gluconate Cloth  6 each Topical Daily  . DULoxetine  40 mg Oral Daily  . erythromycin  250 mg Oral TID WC & HS  . hydrALAZINE  5 mg Intravenous Q6H  . insulin aspart  0-15 Units Subcutaneous Q4H  . insulin aspart  5 Units Subcutaneous TID WC  . insulin glargine  35 Units Subcutaneous Daily  . isosorbide  mononitrate  60 mg Oral Daily  . metoprolol succinate  50 mg Oral Daily  . pantoprazole  40 mg Oral Daily   Continuous Infusions: . sodium chloride 10 mL/hr at 07/26/19 1800     LOS: 7 days    Donnamae Jude, MD 07/29/2019 12:43 PM [phone] [facility] If 7PM-7AM, please contact night-coverage 07/29/2019, 12:43 PM

## 2019-07-30 ENCOUNTER — Encounter (HOSPITAL_COMMUNITY): Payer: Self-pay | Admitting: Internal Medicine

## 2019-07-30 ENCOUNTER — Inpatient Hospital Stay (HOSPITAL_COMMUNITY): Payer: Medicare Other | Admitting: Certified Registered Nurse Anesthetist

## 2019-07-30 ENCOUNTER — Other Ambulatory Visit: Payer: Self-pay

## 2019-07-30 ENCOUNTER — Encounter (HOSPITAL_COMMUNITY)
Admission: EM | Disposition: A | Discharge status: Home / Self Care | Payer: Self-pay | Source: Home / Self Care | Attending: Internal Medicine

## 2019-07-30 HISTORY — PX: COLONOSCOPY WITH PROPOFOL: SHX5780

## 2019-07-30 HISTORY — PX: BIOPSY: SHX5522

## 2019-07-30 HISTORY — PX: POLYPECTOMY: SHX5525

## 2019-07-30 LAB — GLUCOSE, CAPILLARY
Glucose-Capillary: 104 mg/dL — ABNORMAL HIGH (ref 70–99)
Glucose-Capillary: 116 mg/dL — ABNORMAL HIGH (ref 70–99)
Glucose-Capillary: 134 mg/dL — ABNORMAL HIGH (ref 70–99)
Glucose-Capillary: 153 mg/dL — ABNORMAL HIGH (ref 70–99)
Glucose-Capillary: 264 mg/dL — ABNORMAL HIGH (ref 70–99)
Glucose-Capillary: 301 mg/dL — ABNORMAL HIGH (ref 70–99)

## 2019-07-30 LAB — CBC
HCT: 27 % — ABNORMAL LOW (ref 36.0–46.0)
Hemoglobin: 8.1 g/dL — ABNORMAL LOW (ref 12.0–15.0)
MCH: 27.2 pg (ref 26.0–34.0)
MCHC: 30 g/dL (ref 30.0–36.0)
MCV: 90.6 fL (ref 80.0–100.0)
Platelets: 413 10*3/uL — ABNORMAL HIGH (ref 150–400)
RBC: 2.98 MIL/uL — ABNORMAL LOW (ref 3.87–5.11)
RDW: 15.8 % — ABNORMAL HIGH (ref 11.5–15.5)
WBC: 9.9 10*3/uL (ref 4.0–10.5)
nRBC: 0 % (ref 0.0–0.2)

## 2019-07-30 LAB — BASIC METABOLIC PANEL
Anion gap: 8 (ref 5–15)
BUN: 15 mg/dL (ref 6–20)
CO2: 24 mmol/L (ref 22–32)
Calcium: 8.5 mg/dL — ABNORMAL LOW (ref 8.9–10.3)
Chloride: 105 mmol/L (ref 98–111)
Creatinine, Ser: 1.03 mg/dL — ABNORMAL HIGH (ref 0.44–1.00)
GFR calc Af Amer: >60 mL/min (ref >60)
GFR calc non Af Amer: >60 mL/min (ref >60)
Glucose, Bld: 148 mg/dL — ABNORMAL HIGH (ref 70–99)
Potassium: 3.5 mmol/L (ref 3.5–5.1)
Sodium: 137 mmol/L (ref 135–145)

## 2019-07-30 LAB — MAGNESIUM: Magnesium: 1.6 mg/dL — ABNORMAL LOW (ref 1.7–2.4)

## 2019-07-30 LAB — PHOSPHORUS: Phosphorus: 3.2 mg/dL (ref 2.5–4.6)

## 2019-07-30 SURGERY — COLONOSCOPY WITH PROPOFOL
Anesthesia: Monitor Anesthesia Care

## 2019-07-30 MED ORDER — SODIUM CHLORIDE 0.9 % IV SOLN
INTRAVENOUS | Status: DC
  Administered 2019-07-30: 500 mL via INTRAVENOUS

## 2019-07-30 MED ORDER — PROPOFOL 10 MG/ML IV BOLUS
INTRAVENOUS | Status: AC
  Filled 2019-07-30: qty 20

## 2019-07-30 MED ORDER — ONDANSETRON HCL 4 MG/2ML IJ SOLN
INTRAMUSCULAR | Status: DC | PRN
  Administered 2019-07-30: 4 mg via INTRAVENOUS

## 2019-07-30 MED ORDER — PROPOFOL 500 MG/50ML IV EMUL
INTRAVENOUS | Status: DC | PRN
  Administered 2019-07-30: 125 ug/kg/min via INTRAVENOUS

## 2019-07-30 MED ORDER — PROPOFOL 1000 MG/100ML IV EMUL
INTRAVENOUS | Status: AC
  Filled 2019-07-30: qty 100

## 2019-07-30 MED ORDER — MAGNESIUM SULFATE 2 GM/50ML IV SOLN
2.0000 g | Freq: Once | INTRAVENOUS | Status: AC
  Administered 2019-07-30: 2 g via INTRAVENOUS
  Filled 2019-07-30: qty 50

## 2019-07-30 MED ORDER — PROPOFOL 10 MG/ML IV BOLUS
INTRAVENOUS | Status: DC | PRN
  Administered 2019-07-30: 30 mg via INTRAVENOUS

## 2019-07-30 MED ORDER — APIXABAN 5 MG PO TABS: 5.0000 mg | ORAL_TABLET | Freq: Two times a day (BID) | ORAL | Status: DC

## 2019-07-30 SURGICAL SUPPLY — 22 items
ELECT REM PT RETURN 9FT ADLT (ELECTROSURGICAL)
ELECTRODE REM PT RTRN 9FT ADLT (ELECTROSURGICAL) IMPLANT
FCP BXJMBJMB 240X2.8X (CUTTING FORCEPS)
FLOOR PAD 36X40 (MISCELLANEOUS) ×3
FORCEPS BIOP RAD 4 LRG CAP 4 (CUTTING FORCEPS) IMPLANT
FORCEPS BIOP RJ4 240 W/NDL (CUTTING FORCEPS)
FORCEPS BXJMBJMB 240X2.8X (CUTTING FORCEPS) IMPLANT
INJECTOR/SNARE I SNARE (MISCELLANEOUS) IMPLANT
LUBRICANT JELLY 4.5OZ STERILE (MISCELLANEOUS) IMPLANT
MANIFOLD NEPTUNE II (INSTRUMENTS) IMPLANT
NDL SCLEROTHERAPY 25GX240 (NEEDLE) IMPLANT
NEEDLE SCLEROTHERAPY 25GX240 (NEEDLE) IMPLANT
PAD FLOOR 36X40 (MISCELLANEOUS) ×2 IMPLANT
PROBE APC STR FIRE (PROBE) IMPLANT
PROBE INJECTION GOLD (MISCELLANEOUS)
PROBE INJECTION GOLD 7FR (MISCELLANEOUS) IMPLANT
SNARE ROTATE MED OVAL 20MM (MISCELLANEOUS) IMPLANT
SYR 50ML LL SCALE MARK (SYRINGE) IMPLANT
TRAP SPECIMEN MUCOUS 40CC (MISCELLANEOUS) IMPLANT
TUBING ENDO SMARTCAP PENTAX (MISCELLANEOUS) IMPLANT
TUBING IRRIGATION ENDOGATOR (MISCELLANEOUS) ×3 IMPLANT
WATER STERILE IRR 1000ML POUR (IV SOLUTION) IMPLANT

## 2019-07-30 NOTE — Progress Notes (Deleted)
Cardiology Office Note:    Date:  07/30/2019   ID:  Deborah [name], DOB February 03, 1966, MRN [id number]  PCP:  [name], DO  Cardiologist:  Fransico Him, MD    Referring MD: [name], DO   No chief complaint on file.   History of Present Illness:    Deborah [name] is a 54 y.o. female with a hx of HTN, gastroparesis, DM, obesity hx of CVA-2012, CKD-2, fibromyalgia, chronic pain and PAF.In 2017 she had a lexiscan showing a small questionable complete infarct/scar vs artifact in the anterior lateral wall, no reversible ischemia. It was thought her CP was due to gastroparesis and anxiety.  In 2019 she had cath at [facility] and found normal coronary arteries. Echo at that time revealed EF of 45-50%.     Repeat Lexiscan myoview on 01/27/19 showed medium defect of moderate severity in the anterolateral and apical anterior location felt to be consistent with breast attenuation vs chest wall artifact. Stress test read as intermediate risk due to EF of 36%. Follow up echocardiogram revealed EF of 40-45% with global hypokinesis without focal coronary distribution.  03/2018 she had a fib and converted to SR after po lopressor. 03/13/19 had recurrent a fib with RVR and eliquis added.  She also has a hx of Chronic systolic HF.  She never followed up in the office after January 2021 and then has had several admits for gastroparesis and  DKA. Most recent 06/22/19 to 06/26/19 -she has remained on eliquis for PAF.    She presented 06/29/19 with chest pain SOB and diaphoresis and weeping of her lower ext.  She was admitted with DKA, and gastroparesis. Readmitted with chest pain 07/02/2019.  Pain was atypical and sharp but did improve with SL NTG.  EKG with no acute changes.  Imdur 60mg  daily was added for possible microvascular disease.    SHe is here today for followup and is doing well.  She denies any chest pain or pressure, SOB, DOE, PND, orthopnea, LE edema, dizziness,  palpitations or syncope. SHe is compliant with her meds and is tolerating meds with no SE.    Past Medical History:  Diagnosis Date  . Acute back pain with sciatica, left   . Acute back pain with sciatica, right   . AKI (acute kidney injury) ([place])   . Anemia, unspecified   . Chronic pain   . Chronic systolic CHF (congestive heart failure) ([place])   . Diabetes mellitus   . Esophageal reflux   . Fibromyalgia   . Gastric ulcer   . Gastroparesis   . Gout   . Hyperlipidemia   . Hypertension   . Lumbosacral stenosis   . NICM (nonischemic cardiomyopathy) ([place])   . Obesity   . PAF (paroxysmal atrial fibrillation) ([place])   . Stroke ([place]) 02/2011  . Vitamin B12 deficiency anemia     Past Surgical History:  Procedure Laterality Date  . BIOPSY  07/27/2019   Procedure: BIOPSY;  Surgeon: Clarene Essex, MD;  Location: [facility] ENDOSCOPY;  Service: Endoscopy;;  . CATARACT EXTRACTION  01/2014  . CHOLECYSTECTOMY    . ESOPHAGOGASTRODUODENOSCOPY N/A 07/27/2019   Procedure: ESOPHAGOGASTRODUODENOSCOPY (EGD);  Surgeon: Clarene Essex, MD;  Location: [facility];  Service: Endoscopy;  Laterality: N/A;    Current Medications: No outpatient medications have been marked as taking for the 07/31/19 encounter (Appointment) with Sueanne Margarita, MD.     Allergies:   Contrast media [iodinated diagnostic agents], Diazepam, Isovue [iopamidol], Lisinopril, Penicillins, Acetaminophen, Tolmetin,  Aspirin, Nsaids, and Tramadol   Social History   Socioeconomic History  . Marital status: Single    Spouse name: Not on file  . Number of children: Not on file  . Years of education: Not on file  . Highest education level: Not on file  Occupational History  . Not on file  Tobacco Use  . Smoking status: Never Smoker  . Smokeless tobacco: Never Used  Substance and Sexual Activity  . Alcohol use: No  . Drug use: No  . Sexual activity: Not Currently    Birth control/protection: None  Other Topics Concern  . Not on file    Social History Narrative   ** Merged History Encounter **       Social Determinants of Health   Financial Resource Strain:   . Difficulty of Paying Living Expenses:   Food Insecurity:   . Worried About Charity fundraiser in the Last Year:   . Arboriculturist in the Last Year:   Transportation Needs:   . Film/video editor (Medical):   [name] Lack of Transportation (Non-Medical):   Physical Activity:   . Days of Exercise per Week:   . Minutes of Exercise per Session:   Stress:   . Feeling of Stress :   Social Connections:   . Frequency of Communication with Friends and Family:   . Frequency of Social Gatherings with Friends and Family:   . Attends Religious Services:   . Active Member of Clubs or Organizations:   . Attends Archivist Meetings:   [name] Marital Status:      Family History: The patient's family history includes Congestive Heart Failure (age of onset: 71) in her sister; Diabetes in her brother, father, mother, and sister; Heart disease in her father.  ROS:   Please see the history of present illness.    ROS  All other systems reviewed and negative.   EKGs/Labs/Other Studies Reviewed:    The following studies were reviewed today: Hospital notes from recent admits  EKG:  EKG is not ordered today.    Recent Labs: 07/02/2019: B Natriuretic Peptide 61.4 07/22/2019: TSH 2.576 07/27/2019: ALT 9 07/30/2019: BUN 15; Creatinine, Ser 1.03; Hemoglobin 8.1; Magnesium 1.6; Platelets 413; Potassium 3.5; Sodium 137   Recent Lipid Panel    Component Value Date/Time   CHOL 104 07/22/2019 1529   CHOL 106 12/13/2018 0000   TRIG 185 (H) 07/22/2019 1529   HDL 30 (L) 07/22/2019 1529   HDL 36 (L) 12/13/2018 0000   CHOLHDL 3.5 07/22/2019 1529   VLDL 37 07/22/2019 1529   LDLCALC 37 07/22/2019 1529   LDLCALC 38 12/13/2018 0000    Physical Exam:    VS:  LMP 10/10/2012     Wt Readings from Last 3 Encounters:  07/30/19 (!) 342 lb 13 oz (155.5 kg)  07/19/19 (!)  333 lb 1.6 oz (151.1 kg)  07/04/19 (!) 344 lb 2.2 oz (156.1 kg)     GEN:  Well nourished, well developed in no acute distress HEENT: Normal NECK: No JVD; No carotid bruits LYMPHATICS: No lymphadenopathy CARDIAC: RRR, no murmurs, rubs, gallops RESPIRATORY:  Clear to auscultation without rales, wheezing or rhonchi  ABDOMEN: Soft, non-tender, non-distended MUSCULOSKELETAL:  No edema; No deformity  SKIN: Warm and dry NEUROLOGIC:  Alert and oriented x 3 PSYCHIATRIC:  Normal affect   ASSESSMENT:    1. Other chest pain   2. Chronic systolic CHF (congestive heart failure) ([place])   3. PAF (paroxysmal  atrial fibrillation) ([place])   4. Essential hypertension, benign   5. Gastroparesis   6. Morbid obesity ([place])    PLAN:    In order of problems listed above:  1.  Atypical chest pain -pain is atypical and likely related to her gastroparesis -it is nitrate responsive and therefore may be due to esophageal spasm vs. Microvascular heart disease -continue Imdur 60mg  daily>> pain improved on this -no ASA due to DOAC  2.  Chronic systolic CHF -EF by echo 16-94% with global HK -she does not appear volume overloaded on exam today -continue Torsemide 40mg  daily  3.  PAF -maintaining NSR on exam -denies any bleeding problems with DOAC -continue apixaban 5mg  BID and Toprol XL 25mg  daily  4.  HTN -BP controlled on exam -continue amlodipine 10mg  daily, Hydralazine 25mg  TID, Toprol XL 25mg  daily, Imdur 60mg  daily  5.  Recurring DKA/gastroparesis -per PCP  6.  Morbid Obesity -I have encouraged her to get into a routine exercise program and cut back on carbs and portions.  -I will refer her to the healthy weight and wellness clinic   Medication Adjustments/Labs and Tests Ordered: Current medicines are reviewed at length with the patient today.  Concerns regarding medicines are outlined above.  No orders of the defined types were placed in this encounter.  No orders of the defined types  were placed in this encounter.   Signed, Fransico Him, MD  07/30/2019 9:49 PM    [place]

## 2019-07-30 NOTE — Transfer of Care (Signed)
Immediate Anesthesia Transfer of Care Note  Patient: Deborah [name]  Procedure(s) Performed: COLONOSCOPY WITH PROPOFOL (N/A ) BIOPSY POLYPECTOMY  Patient Location: PACU and Endoscopy Unit  Anesthesia Type:MAC  Level of Consciousness: awake, alert  and oriented  Airway & Oxygen Therapy: Patient Spontanous Breathing and Patient connected to face mask oxygen  Post-op Assessment: Report given to RN and Post -op Vital signs reviewed and stable  Post vital signs: Reviewed and stable  Last Vitals:  Vitals Value Taken Time  BP    Temp    Pulse 92 07/30/19 1005  Resp 16 07/30/19 1005  SpO2 100 % 07/30/19 1005  Vitals shown include unvalidated device data.  Last Pain:  Vitals:   07/30/19 0831  TempSrc: Oral  PainSc: 0-No pain      Patients Stated Pain Goal: 2 (09/81/19 1478)  Complications: No apparent anesthesia complications

## 2019-07-30 NOTE — Anesthesia Preprocedure Evaluation (Signed)
Anesthesia Evaluation  Patient identified by MRN, date of birth, ID band Patient awake    Reviewed: Allergy & Precautions, NPO status , Patient's Chart, lab work & pertinent test results, reviewed documented beta blocker date and time   History of Anesthesia Complications Negative for: history of anesthetic complications  Airway Mallampati: II  TM Distance: >3 FB Neck ROM: Full    Dental  (+) Edentulous Upper, Dental Advisory Given, Missing   Pulmonary shortness of breath, COPD,  COPD inhaler,  07/22/2019 SARS coronavirus NEG   breath sounds clear to auscultation       Cardiovascular hypertension, Pt. on medications and Pt. on home beta blockers (-) angina+CHF  + dysrhythmias Atrial Fibrillation  Rhythm:Regular Rate:Normal  07/23/2019 ECHO: EF 45-50%, valves OK 1. Left ventricular ejection fraction, by estimation, is 45 to 50%. The left ventricle has mildly decreased function. The left ventricle demonstrates global hypokinesis. Left ventricular diastolic function could not be evaluated.  2. Right ventricular systolic function is normal. The right ventricular size is normal. There is moderately elevated pulmonary artery systolic pressure.  3. The mitral valve is normal in structure. Trivial mitral valve regurgitation. No evidence of mitral stenosis.  4. The aortic valve is tricuspid. Aortic valve regurgitation is not visualized. Mild aortic valve sclerosis is present, with no evidence of aortic valve stenosis.  5. The inferior vena cava is normal in size with greater than 50% respiratory variability, suggesting right atrial pressure of 3 mmHg.    Neuro/Psych PSYCHIATRIC DISORDERS Anxiety Depression CVA (R sided weakness, uses wheelchair), Residual Symptoms    GI/Hepatic Neg liver ROS, PUD, GERD  Medicated and Controlled,Gastroparesis    Endo/Other  diabetes, Insulin DependentMorbid obesityBMI 55  Renal/GU Renal  InsufficiencyRenal disease (creat 1.34)CKD 2  negative genitourinary   Musculoskeletal  (+) Fibromyalgia -Sciatica    Abdominal (+) + obese,   Peds  Hematology  (+) Blood dyscrasia (Hb 7.7), anemia , eliquis hct 24.9   Anesthesia Other Findings   Reproductive/Obstetrics negative OB ROS                             Anesthesia Physical Anesthesia Plan  ASA: III  Anesthesia Plan: MAC   Post-op Pain Management:    Induction:   PONV Risk Score and Plan: 2 and Propofol infusion and TIVA  Airway Management Planned: Natural Airway and Simple Face Mask  Additional Equipment: None  Intra-op Plan:   Post-operative Plan:   Informed Consent: I have reviewed the patients History and Physical, chart, labs and discussed the procedure including the risks, benefits and alternatives for the proposed anesthesia with the patient or authorized representative who has indicated his/her understanding and acceptance.       Plan Discussed with: CRNA  Anesthesia Plan Comments:         Anesthesia Quick Evaluation

## 2019-07-30 NOTE — Progress Notes (Signed)
-  Patient seen and examined in endoscopy unit.  -Was not able to drink prep and was subsequently given magnesium citrate. -Denies seeing any blood in the stool. -Complaining of nausea and abdominal discomfort after drinking prep.  Abdominal exam benign.  Assessment. ------------------ -Iron deficiency anemia -Gastroparesis  Recommendations ------------------------ -Proceed with colonoscopy today.  Risks (bleeding, infection, bowel perforation that could require surgery, sedation-related changes in cardiopulmonary systems), benefits (identification and possible treatment of source of symptoms, exclusion of certain causes of symptoms), and alternatives (watchful waiting, radiographic imaging studies, empiric medical treatment)  were explained to patient/family in detail and patient wishes to proceed.  Otis Brace MD, [place] 07/30/2019, 8:42 AM  Contact #  [phone]

## 2019-07-30 NOTE — Progress Notes (Signed)
PROGRESS NOTE  Deborah [name] DXA:[id number] DOB: 30-Aug-1965 DOA: 07/21/2019 PCP: [name], DO  HPI/Recap of past 24 hours: Patient is a 54 year old female with past medical history of depression, nonhemorrhagic CVA, chronic systolic congestive heart failure, paroxysmal A. fib on Eliquis, diabetes type 2, recurrent admission for DKA, diabetic gastroparesis, CKD stage IIIa, hypertension, gout, gastric ulcer, GERD, anemia of chronic disease who presented to the emergency room with complaints of nausea, vomiting, diffuse abdominal pain. She was recently admitted here for the management of exacerbation of gastroparesis earlier this month. GI has been consulted and following. On presentation she was tachycardic, tachypneic with lactic acidosis. Underwent gastric emptying study which came out to be positive. UnderwentEGD to rule out gastric outlet obstruction but it did not show any abnormal findings. GI planning for colonoscopy on Monday.  07/30/19: Seen and examined post colonoscopy.  Reports persistent nausea and diffuse abdominal pain.  Colonoscopy showed 2 to 3 mm polyps in the descending colon which were biopsied.  Also showed 110 mm polyp that was removed with hot snare.  GI recommends to resume Eliquis in 2 days   Assessment/Plan: Active Problems:   Essential hypertension, benign   Uncontrolled type 2 diabetes mellitus with diabetic neuropathy, with long-term current use of insulin (HCC)   CKD (chronic kidney disease), stage II   Diabetic gastroparesis (HCC)   Morbid obesity (HCC)   Anxiety   Intractable nausea and vomiting   Chronic systolic CHF (congestive heart failure) ([place])   Uncontrolled type 2 diabetes mellitus with hyperglycemia (HCC)   Hyperosmolar hyperglycemic state (HHS) ([place])   Drug-seeking behavior   Intractable abdominal pain/gastroparesis Continue management as recommended by GI She had an EGD done on 07/27/2019 Colonoscopy showed findings stated  above Continue gastroparesis regimen  Chronic normocytic anemia Post colonoscopy Follow-up biopsies  Type 2 diabetes with hyperglycemia Latest hemoglobin A1c 8.4 Continue short acting and long-acting insulin. Continue insulin sliding scale  History of nonhemorrhagic CVA Continue current management  Paroxysmal A. fib Rate controlled on beta-blocker Eliquis for secondary CVA prevention, on hold, resume on 08/01/2019  Iron deficiency anemia Iron studies suggestive of iron deficiency Start ferrous sulfate 325 mg daily Hemoglobin stable 8.1 with no overt bleeding  Morbid obesity BMI 55 Recommend weight loss outpatient with regular physical activity and healthy dieting once stable  GERD Continue PPI  Chronic anxiety/depression Continue cymbalta  Essential HTN BP stable Continue cardiac meds  Chronic sCHF Euvolemic on exam Strict I&O and daily weight Continue cardiac meds  Tardive dyskinesia: Has involuntary movement of her tongue and mouth consistent with tardive dyskinesia. Could be associated with Reglan that she was using in the past. Continue supportive care,avoid Reglan as much as possible  Hypomagnesemia Magnesium 1.6 Repleted    Code Status: Full code  Family Communication: None at bedside.    Consultants:  GI  Procedures:  EGD  Colonoscopy  Antimicrobials:  None  DVT prophylaxis:  .  Restart Eliquis on 08/01/2019  Status is: Inpatient    Dispo: The patient is from: Home.              Anticipated d/c is to: Home in the next 24 to 48 hours              Anticipated d/c date is: 07/31/2019              Patient currently has ongoing intractable nausea with poor oral intake.         Objective: Vitals:   07/30/19  1006 07/30/19 1010 07/30/19 1020 07/30/19 1237  BP: (!) 98/40 (!) 115/48 (!) 152/53 119/60  Pulse: 92 93 94 95  Resp: 16 (!) 29 14 18   Temp: (!) 97 F (36.1 C)   97.9 F (36.6 C)  TempSrc: Oral   Oral  SpO2: 100%  100% 99% 98%  Weight:      Height:        Intake/Output Summary (Last 24 hours) at 07/30/2019 1457 Last data filed at 07/30/2019 1100 Gross per 24 hour  Intake 420 ml  Output --  Net 420 ml   Filed Weights   07/29/19 0500 07/30/19 0455 07/30/19 0831  Weight: (!) 155.2 kg (!) 155.5 kg (!) 155.5 kg    Exam:  . General: 54 y.o. year-old female well developed well nourished in no acute distress.  Alert and oriented x3. . Cardiovascular: Regular rate and rhythm with no rubs or gallops.  No thyromegaly or JVD noted.   [name] Respiratory: Clear to auscultation with no wheezes or rales. Good inspiratory effort. . Abdomen: Obese diffusely tender bowel sounds present. . Musculoskeletal: No lower extremity edema. 2/4 pulses in all 4 extremities. [name] Psychiatry: Mood is appropriate for condition and setting   Data Reviewed: CBC: Recent Labs  Lab 07/25/19 0204 07/25/19 0204 07/26/19 0239 07/27/19 0401 07/28/19 0349 07/29/19 0400 07/30/19 1105  WBC 13.0*   < > 11.4* 12.0* 10.8* 9.6 9.9  NEUTROABS 8.5*  --  7.2 8.2* 7.1 5.9  --   HGB 7.7*   < > 7.1* 7.6*  7.7* 7.5* 7.3* 8.1*  HCT 26.4*   < > 23.1* 25.4*  25.4* 25.0* 24.9* 27.0*  MCV 90.4   < > 88.8 88.8 89.9 91.9 90.6  PLT 410*   < > 394 365 376 379 413*   < > = values in this interval not displayed.   Basic Metabolic Panel: Recent Labs  Lab 07/24/19 0253 07/24/19 0253 07/25/19 0204 07/25/19 0204 07/26/19 0239 07/27/19 0401 07/28/19 0349 07/29/19 0400 07/30/19 1105  NA 138   < > 135   < > 134* 135 138 139 137  K 3.6   < > 3.2*   < > 3.3* 3.6 3.4* 3.9 3.5  CL 102   < > 99   < > 100 104 108 108 105  CO2 27   < > 22   < > 26 26 24 22 24   GLUCOSE 193*   < > 297*   < > 165* 163* 186* 234* 148*  BUN 14   < > 21*   < > 23* 25* 21* 23* 15  CREATININE 1.51*   < > 2.10*   < > 1.71* 1.34* 1.14* 1.07* 1.03*  CALCIUM 8.2*   < > 8.8*   < > 8.4* 8.6* 8.4* 8.6* 8.5*  MG 1.7  --  1.8  --   --   --   --   --  1.6*  PHOS 3.1  --  2.5  --    --   --   --   --  3.2   < > = values in this interval not displayed.   GFR: Estimated Creatinine Clearance: 95.3 mL/min (A) (by C-G formula based on SCr of 1.03 mg/dL (H)). Liver Function Tests: Recent Labs  Lab 07/24/19 0253 07/25/19 0204 07/26/19 0239 07/27/19 0401  AST 11* 12* 12* 10*  ALT 8 9 9 9   ALKPHOS 79 91 85 84  BILITOT 0.8 0.5 0.5 0.8  PROT 6.4*  6.5 6.3* 6.1*  ALBUMIN 3.0* 3.1* 2.9* 2.8*   No results for input(s): LIPASE, AMYLASE in the last 168 hours. No results for input(s): AMMONIA in the last 168 hours. Coagulation Profile: No results for input(s): INR, PROTIME in the last 168 hours. Cardiac Enzymes: No results for input(s): CKTOTAL, CKMB, CKMBINDEX, TROPONINI in the last 168 hours. BNP (last 3 results) No results for input(s): PROBNP in the last 8760 hours. HbA1C: No results for input(s): HGBA1C in the last 72 hours. CBG: Recent Labs  Lab 07/29/19 2015 07/30/19 0004 07/30/19 0417 07/30/19 0740 07/30/19 1137  GLUCAP 100* 104* 116* 134* 153*   Lipid Profile: No results for input(s): CHOL, HDL, LDLCALC, TRIG, CHOLHDL, LDLDIRECT in the last 72 hours. Thyroid Function Tests: No results for input(s): TSH, T4TOTAL, FREET4, T3FREE, THYROIDAB in the last 72 hours. Anemia Panel: No results for input(s): VITAMINB12, FOLATE, FERRITIN, TIBC, IRON, RETICCTPCT in the last 72 hours. Urine analysis:    Component Value Date/Time   COLORURINE YELLOW 07/21/2019 2335   APPEARANCEUR CLEAR 07/21/2019 2335   LABSPEC 1.012 07/21/2019 2335   PHURINE 6.0 07/21/2019 2335   GLUCOSEU >=500 (A) 07/21/2019 2335   HGBUR SMALL (A) 07/21/2019 2335   BILIRUBINUR NEGATIVE 07/21/2019 2335   KETONESUR 20 (A) 07/21/2019 2335   PROTEINUR >=300 (A) 07/21/2019 2335   UROBILINOGEN 0.2 10/02/2013 2108   NITRITE NEGATIVE 07/21/2019 2335   LEUKOCYTESUR NEGATIVE 07/21/2019 2335   Sepsis Labs: @LABRCNTIP (procalcitonin:4,lacticidven:4)  ) Recent Results (from the past 240 hour(s))   Urine culture     Status: Abnormal   Collection Time: 07/21/19 11:36 PM   Specimen: Urine, Clean Catch  Result Value Ref Range Status   Specimen Description   Final    URINE, CLEAN CATCH Performed at [facility], [place] [address], [place], [place] [address]    Special Requests   Final    NONE Performed at [facility], [place] [address] [place], [place] [address]    Culture MULTIPLE SPECIES PRESENT, SUGGEST RECOLLECTION (A)  Final   Report Status 07/23/2019 FINAL  Final  SARS Coronavirus 2 by RT PCR (hospital order, performed in [facility] hospital lab) Nasopharyngeal Nasopharyngeal Swab     Status: None   Collection Time: 07/22/19 11:11 PM   Specimen: Nasopharyngeal Swab  Result Value Ref Range Status   SARS Coronavirus 2 NEGATIVE NEGATIVE Final    Comment: (NOTE) SARS-CoV-2 target nucleic acids are NOT DETECTED. The SARS-CoV-2 RNA is generally detectable in upper and lower respiratory specimens during the acute phase of infection. The lowest concentration of SARS-CoV-2 viral copies this assay can detect is 250 copies / mL. A negative result does not preclude SARS-CoV-2 infection and should not be used as the sole basis for treatment or other patient management decisions.  A negative result may occur with improper specimen collection / handling, submission of specimen other than nasopharyngeal swab, presence of viral mutation(s) within the areas targeted by this assay, and inadequate number of viral copies (<250 copies / mL). A negative result must be combined with clinical observations, patient history, and epidemiological information. Fact Sheet for Patients:   StrictlyIdeas.no Fact Sheet for Healthcare Providers: BankingDealers.co.za This test is not yet approved or cleared  by the [place] FDA and has been authorized for detection and/or diagnosis of SARS-CoV-2 by FDA under an Emergency  Use Authorization (EUA).  This EUA will remain in effect (meaning this test can be used) for the duration of the COVID-19 declaration under Section  564(b)(1) of the Act, 21 U.S.C. section 360bbb-3(b)(1), unless the authorization is terminated or revoked sooner. Performed at [facility], [place] [address], [place], [place] [address]   MRSA PCR Screening     Status: None   Collection Time: 07/22/19 11:44 PM   Specimen: Nasopharyngeal  Result Value Ref Range Status   MRSA by PCR NEGATIVE NEGATIVE Final    Comment:        The GeneXpert MRSA Assay (FDA approved for NASAL specimens only), is one component of a comprehensive MRSA colonization surveillance program. It is not intended to diagnose MRSA infection nor to guide or monitor treatment for MRSA infections. Performed at [facility], [place] [address] [place], [place] [address]       Studies: No results found.  Scheduled Meds: . sodium chloride   Intravenous Once  . [START ON 08/01/2019] apixaban  5 mg Oral BID  . Chlorhexidine Gluconate Cloth  6 each Topical Daily  . DULoxetine  40 mg Oral Daily  . erythromycin  250 mg Oral TID WC & HS  . hydrALAZINE  5 mg Intravenous Q6H  . insulin aspart  0-15 Units Subcutaneous Q4H  . insulin aspart  5 Units Subcutaneous TID WC  . insulin glargine  35 Units Subcutaneous Daily  . isosorbide mononitrate  60 mg Oral Daily  . metoprolol succinate  50 mg Oral Daily  . pantoprazole  40 mg Oral Daily    Continuous Infusions: . sodium chloride 10 mL/hr at 07/26/19 1800  . magnesium sulfate bolus IVPB       LOS: 8 days     Kayleen Memos, MD [facility] Pager [phone]  If 7PM-7AM, please contact night-coverage [email or web address] Password [facility] 07/30/2019, 2:57 PM

## 2019-07-30 NOTE — Op Note (Addendum)
[facility] Patient Name: Deborah [name] Procedure Date: 07/30/2019 MRN: [id number] Attending MD: Otis Brace , MD Date of Birth: 10-25-1965 [name]: [id number] Age: 54 Admit Type: Outpatient Procedure:                Colonoscopy Indications:              Last colonoscopy: date unknown (unable to locate                            last colonoscopy report), Iron deficiency anemia Providers:                Otis Brace, MD, Josie Dixon, RN, Clyde Lundborg, RN, Theodora Blow, Technician Referring MD:              Medicines:                Sedation Administered by an Anesthesia Professional Complications:            No immediate complications. Estimated Blood Loss:     Estimated blood loss was minimal. Procedure:                Pre-Anesthesia Assessment:                           - Prior to the procedure, a History and Physical                            was performed, and patient medications and                            allergies were reviewed. The patient's tolerance of                            previous anesthesia was also reviewed. The risks                            and benefits of the procedure and the sedation                            options and risks were discussed with the patient.                            All questions were answered, and informed consent                            was obtained. Prior Anticoagulants: The patient has                            taken Eliquis (apixaban), last dose was 1 day prior                            to procedure. ASA Grade Assessment: III - A patient  with severe systemic disease. After reviewing the                            risks and benefits, the patient was deemed in                            satisfactory condition to undergo the procedure.                           After obtaining informed consent, the colonoscope                            was  passed under direct vision. Throughout the                            procedure, the patient's blood pressure, pulse, and                            oxygen saturations were monitored continuously. The                            [id number]-[id number] ([id number]) Olympus pediatric colonscope                            was introduced through the anus and advanced to the                            the terminal ileum, with identification of the                            appendiceal orifice and IC valve. The colonoscopy                            was performed without difficulty. The patient                            tolerated the procedure well. The quality of the                            bowel preparation was fair. Scope In: 9:35:34 AM Scope Out: 9:57:50 AM Scope Withdrawal Time: 0 hours 15 minutes 57 seconds  Total Procedure Duration: 0 hours 22 minutes 16 seconds  Findings:      The perianal and digital rectal examinations were normal.      The terminal ileum appeared normal.      The quality of the bowel preparation was fair.      There is no endoscopic evidence of bleeding in the entire colon.      Two polyps were found in the descending colon. The polyps were 2 to 3 mm       in size. Biopsies were taken with a cold forceps for histology.      A 10 mm polyp was found in the proximal sigmoid colon. The polyp was       sessile. The polyp was removed with a hot snare. Resection and retrieval  were complete.      Internal hemorrhoids were found during retroflexion. The hemorrhoids       were medium-sized. Impression:               - Preparation of the colon was fair.                           - The examined portion of the ileum was normal.                           - Two 2 to 3 mm polyps in the descending colon.                            Biopsied.                           - One 10 mm polyp in the proximal sigmoid colon,                            removed with a hot snare. Resected and  retrieved.                           - Internal hemorrhoids. Moderate Sedation:      Moderate (conscious) sedation was personally administered by an       anesthesia professional. The following parameters were monitored: oxygen       saturation, heart rate, blood pressure, and response to care. Recommendation:           - Return patient to hospital ward for ongoing care.                           - Cardiac diet.                           - Continue present medications.                           - Resume Eliquis (apixaban) at prior dose in 2 days.                           - Await pathology results.                           - Repeat colonoscopy date to be determined after                            pending pathology results are reviewed for                            surveillance based on pathology results. Procedure Code(s):        --- Professional ---                           [phone], Colonoscopy, flexible; with removal of  tumor(s), polyp(s), or other lesion(s) by snare                            technique                           [id number], 59, Colonoscopy, flexible; with biopsy,                            single or multiple Diagnosis Code(s):        --- Professional ---                           K64.8, Other hemorrhoids                           K63.5, Polyp of colon                           D50.9, Iron deficiency anemia, unspecified CPT copyright 2019 American Medical Association. All rights reserved. The codes documented in this report are preliminary and upon coder review may  be revised to meet current compliance requirements. Otis Brace, MD Otis Brace, MD 07/30/2019 10:16:48 AM Number of Addenda: 0

## 2019-07-30 NOTE — Progress Notes (Signed)
PT Cancellation Note  Patient Details Name: Deborah [name] MRN: [id number] DOB: 1965/09/12   Cancelled Treatment:    Reason Eval/Treat Not Completed: Patient at procedure or test/unavailable(endo)   York Ram E 07/30/2019, 9:23 AM Arlyce Dice, DPT Acute Rehabilitation Services Office: [phone]

## 2019-07-30 NOTE — Anesthesia Postprocedure Evaluation (Signed)
Anesthesia Post Note  Patient: Deborah [name]  Procedure(s) Performed: COLONOSCOPY WITH PROPOFOL (N/A ) BIOPSY POLYPECTOMY     Patient location during evaluation: PACU Anesthesia Type: MAC Level of consciousness: awake and alert Pain management: pain level controlled Vital Signs Assessment: post-procedure vital signs reviewed and stable Respiratory status: spontaneous breathing, nonlabored ventilation and respiratory function stable Cardiovascular status: blood pressure returned to baseline and stable Postop Assessment: no apparent nausea or vomiting Anesthetic complications: no    Last Vitals:  Vitals:   07/30/19 0831 07/30/19 1006  BP: (!) 158/85 (!) 98/40  Pulse: 93 92  Resp: (!) 22 16  Temp: 36.6 C   SpO2:  100%    Last Pain:  Vitals:   07/30/19 1006  TempSrc:   PainSc: 0-No pain                 Pervis Hocking

## 2019-07-30 NOTE — Brief Op Note (Signed)
07/21/2019 - 07/30/2019  10:18 AM  PATIENT:  Deborah [name]  54 y.o. female  PRE-OPERATIVE DIAGNOSIS:  iron deficiency anemia  POST-OPERATIVE DIAGNOSIS:  polyp biopsy, polypectomy, no active bleeding  PROCEDURE:  Procedure(s): COLONOSCOPY WITH PROPOFOL (N/A) BIOPSY POLYPECTOMY  SURGEON:  Surgeon(s) and Role:    * Kerman Pfost, MD - Primary  Findings ----------- -Fair prep, internal hemorrhoids, no active bleeding -2 diminutive polyps removed with biopsy forcep.  One 10 mm polyp removed with hot snare.  Recommendations ------------------------- -Hold anticoagulation for 2 days if possible -Start cardiac diet -Follow H&H -GI will follow  Otis Brace MD, FACP 07/30/2019, 10:19 AM  Contact #  [phone]

## 2019-07-31 ENCOUNTER — Encounter: Payer: Self-pay | Admitting: *Deleted

## 2019-07-31 ENCOUNTER — Ambulatory Visit: Payer: Medicare Other | Admitting: Cardiology

## 2019-07-31 LAB — GLUCOSE, CAPILLARY
Glucose-Capillary: 182 mg/dL — ABNORMAL HIGH (ref 70–99)
Glucose-Capillary: 183 mg/dL — ABNORMAL HIGH (ref 70–99)
Glucose-Capillary: 184 mg/dL — ABNORMAL HIGH (ref 70–99)
Glucose-Capillary: 220 mg/dL — ABNORMAL HIGH (ref 70–99)
Glucose-Capillary: 261 mg/dL — ABNORMAL HIGH (ref 70–99)
Glucose-Capillary: 302 mg/dL — ABNORMAL HIGH (ref 70–99)
Glucose-Capillary: 320 mg/dL — ABNORMAL HIGH (ref 70–99)

## 2019-07-31 LAB — CBC
HCT: 24.3 % — ABNORMAL LOW (ref 36.0–46.0)
Hemoglobin: 7.3 g/dL — ABNORMAL LOW (ref 12.0–15.0)
MCH: 27.7 pg (ref 26.0–34.0)
MCHC: 30 g/dL (ref 30.0–36.0)
MCV: 92 fL (ref 80.0–100.0)
Platelets: 383 10*3/uL (ref 150–400)
RBC: 2.64 MIL/uL — ABNORMAL LOW (ref 3.87–5.11)
RDW: 15.9 % — ABNORMAL HIGH (ref 11.5–15.5)
WBC: 10.9 10*3/uL — ABNORMAL HIGH (ref 4.0–10.5)
nRBC: 0 % (ref 0.0–0.2)

## 2019-07-31 LAB — PREPARE RBC (CROSSMATCH)

## 2019-07-31 LAB — BASIC METABOLIC PANEL
Anion gap: 6 (ref 5–15)
BUN: 19 mg/dL (ref 6–20)
CO2: 25 mmol/L (ref 22–32)
Calcium: 8.8 mg/dL — ABNORMAL LOW (ref 8.9–10.3)
Chloride: 106 mmol/L (ref 98–111)
Creatinine, Ser: 1.46 mg/dL — ABNORMAL HIGH (ref 0.44–1.00)
GFR calc Af Amer: 46 mL/min — ABNORMAL LOW (ref >60)
GFR calc non Af Amer: 40 mL/min — ABNORMAL LOW (ref >60)
Glucose, Bld: 189 mg/dL — ABNORMAL HIGH (ref 70–99)
Potassium: 3.3 mmol/L — ABNORMAL LOW (ref 3.5–5.1)
Sodium: 137 mmol/L (ref 135–145)

## 2019-07-31 LAB — MAGNESIUM: Magnesium: 1.8 mg/dL (ref 1.7–2.4)

## 2019-07-31 LAB — SURGICAL PATHOLOGY

## 2019-07-31 MED ORDER — INSULIN ASPART 100 UNIT/ML ~~LOC~~ SOLN
0.0000 [IU] | Freq: Three times a day (TID) | SUBCUTANEOUS | Status: DC
  Administered 2019-08-01: 7 [IU] via SUBCUTANEOUS
  Administered 2019-08-01: 4 [IU] via SUBCUTANEOUS
  Administered 2019-08-01 – 2019-08-04 (×7): 7 [IU] via SUBCUTANEOUS
  Administered 2019-08-04: 3 [IU] via SUBCUTANEOUS
  Administered 2019-08-05: 4 [IU] via SUBCUTANEOUS
  Administered 2019-08-05 (×2): 7 [IU] via SUBCUTANEOUS
  Administered 2019-08-06: 4 [IU] via SUBCUTANEOUS
  Administered 2019-08-06: 15 [IU] via SUBCUTANEOUS
  Administered 2019-08-06 – 2019-08-07 (×4): 4 [IU] via SUBCUTANEOUS
  Administered 2019-08-08 (×2): 3 [IU] via SUBCUTANEOUS
  Administered 2019-08-08: 4 [IU] via SUBCUTANEOUS

## 2019-07-31 MED ORDER — SODIUM CHLORIDE 0.9% IV SOLUTION: Freq: Once | INTRAVENOUS | Status: AC

## 2019-07-31 MED ORDER — POLYETHYLENE GLYCOL 3350 17 G PO PACK: 17.0000 g | PACK | Freq: Every day | ORAL | Status: DC | PRN

## 2019-07-31 MED ORDER — POTASSIUM CHLORIDE CRYS ER 20 MEQ PO TBCR
40.0000 meq | EXTENDED_RELEASE_TABLET | Freq: Two times a day (BID) | ORAL | Status: AC
  Administered 2019-07-31 (×2): 40 meq via ORAL
  Filled 2019-07-31 (×2): qty 2

## 2019-07-31 MED ORDER — FERROUS SULFATE 325 (65 FE) MG PO TABS
325.0000 mg | ORAL_TABLET | Freq: Every day | ORAL | Status: DC
  Administered 2019-07-31 – 2019-08-09 (×9): 325 mg via ORAL
  Filled 2019-07-31 (×10): qty 1

## 2019-07-31 MED ORDER — INSULIN ASPART 100 UNIT/ML ~~LOC~~ SOLN
0.0000 [IU] | Freq: Every day | SUBCUTANEOUS | Status: DC
  Administered 2019-07-31 – 2019-08-01 (×2): 3 [IU] via SUBCUTANEOUS
  Administered 2019-08-02 – 2019-08-06 (×3): 2 [IU] via SUBCUTANEOUS
  Administered 2019-08-07: 3 [IU] via SUBCUTANEOUS

## 2019-07-31 MED ORDER — ATORVASTATIN CALCIUM 10 MG PO TABS
10.0000 mg | ORAL_TABLET | Freq: Every day | ORAL | Status: DC
  Administered 2019-07-31 – 2019-08-09 (×10): 10 mg via ORAL
  Filled 2019-07-31 (×11): qty 1

## 2019-07-31 MED ORDER — OXYCODONE HCL 5 MG PO TABS
5.0000 mg | ORAL_TABLET | Freq: Four times a day (QID) | ORAL | Status: DC | PRN
  Filled 2019-07-31 (×2): qty 1

## 2019-07-31 MED ORDER — MORPHINE SULFATE (PF) 2 MG/ML IV SOLN
2.0000 mg | INTRAVENOUS | Status: DC | PRN
  Administered 2019-07-31 – 2019-08-03 (×11): 2 mg via INTRAVENOUS
  Filled 2019-07-31 (×11): qty 1

## 2019-07-31 NOTE — Progress Notes (Signed)
[facility]astroenterology Progress Note  Deborah [name] 54 y.o. 07/10/1965  CC: Anemia   Subjective: Patient seen and examined at bedside. She had some nausea and abdominal discomfort after procedure which has resolved now. Denies any further bleeding episodes.  ROS : Denies any fever. Denies chest pain.   Objective: Vital signs in last 24 hours: Vitals:   07/30/19 2015 07/31/19 0554  BP: 134/70 121/69  Pulse: 99 96  Resp: 16 18  Temp: 97.9 F (36.6 C) (!) 97.5 F (36.4 C)  SpO2: 100% 95%    Physical Exam:  General:  Alert, cooperative, no distress, appears stated age  Head:  Normocephalic, without obvious abnormality, atraumatic  Eyes:  , EOM's intact,   Lungs:    respirations unlabored     Abdomen:   Soft, non-tender, bowel sounds active all four quadrants,  no masses,   Extremities: Extremities normal, atraumatic, no  edema       Lab Results: Recent Labs    07/30/19 1105 07/31/19 0402  NA 137 137  K 3.5 3.3*  CL 105 106  CO2 24 25  GLUCOSE 148* 189*  BUN 15 19  CREATININE 1.03* 1.46*  CALCIUM 8.5* 8.8*  MG 1.6* 1.8  PHOS 3.2  --    No results for input(s): AST, ALT, ALKPHOS, BILITOT, PROT, ALBUMIN in the last 72 hours. Recent Labs    07/29/19 0400 07/29/19 0400 07/30/19 1105 07/31/19 0402  WBC 9.6   < > 9.9 10.9*  NEUTROABS 5.9  --   --   --   HGB 7.3*   < > 8.1* 7.3*  HCT 24.9*   < > 27.0* 24.3*  MCV 91.9   < > 90.6 92.0  PLT 379   < > 413* 383   < > = values in this interval not displayed.   No results for input(s): LABPROT, INR in the last 72 hours.    Assessment/Plan: -Iron deficiency anemia. Colonoscopy yesterday negative for active bleeding. Had a fair prep. 2 diminutive polyps removed with biopsy forcep. One 10 mm polyp removed with hot snare. -Gastroparesis. -History of atrial fibrillation. Eliquis on hold -History of CHF  Recommendations --------------------------- -Transfuse 1 unit of PRBC. -Procedure findings discussed  with the patient at bedside. -Okay to resume Eliquis from tomorrow if no  bleeding episodes -Follow pathology -No further inpatient GI work-up planned. GI will sign off. Call us back if needed   Otis Brace MD, [place] 07/31/2019, 9:56 AM  Contact #  [phone]

## 2019-07-31 NOTE — Care Management Important Message (Signed)
Important Message  Patient Details IM Letter given to Dessa Phi RN Case Manager to present to the Patient Name: Deborah [name] MRN: [id number] Date of Birth: 22-Sep-1965   Medicare Important Message Given:  Yes     Kerin Salen 07/31/2019, 11:51 AM

## 2019-07-31 NOTE — Progress Notes (Addendum)
PROGRESS NOTE  Deborah [name] WFU:[id number] DOB: 03-22-1965 DOA: 07/21/2019 PCP: [name], DO  HPI/Recap of past 24 hours: Patient is a 54 year old female with past medical history of depression, nonhemorrhagic CVA, chronic systolic congestive heart failure, paroxysmal A. fib on Eliquis, diabetes type 2, recurrent admission for DKA, diabetic gastroparesis, CKD stage IIIa, hypertension, gout, gastric ulcer, GERD, anemia of chronic disease who presented to the emergency room with complaints of nausea, vomiting, diffuse abdominal pain. She was recently admitted here for the management of exacerbation of gastroparesis earlier this month. GI has been consulted and following. On presentation she was tachycardic, tachypneic with lactic acidosis. Underwent gastric emptying study which came out to be positive. UnderwentEGD to rule out gastric outlet obstruction but it did not show any abnormal findings. GI planning for colonoscopy 07/30/19.  Colonoscopy showed 2 to 3 mm polyps in the descending colon which were biopsied.  Also showed 110 mm polyp that was removed with hot snare.  GI recommends to resume Eliquis in 2 days on 08/01/19.  07/31/19:  Still reports abd pain.  Drop in Hg this am, 1 U PRBC ordered to be transfused.  Cr uptrending, will repeat BMP and CBC in the am   Assessment/Plan: Active Problems:   Essential hypertension, benign   Uncontrolled type 2 diabetes mellitus with diabetic neuropathy, with long-term current use of insulin (HCC)   CKD (chronic kidney disease), stage II   Diabetic gastroparesis (HCC)   Morbid obesity (HCC)   Anxiety   Intractable nausea and vomiting   Chronic systolic CHF (congestive heart failure) ([place])   Uncontrolled type 2 diabetes mellitus with hyperglycemia (HCC)   Hyperosmolar hyperglycemic state (HHS) ([place])   Drug-seeking behavior   Intractable abdominal pain/gastroparesis Continue management as recommended by GI She had an EGD done  on 07/27/2019 Colonoscopy showed findings stated above Continue gastroparesis regimen Declines oxy, restarted IV morphine prn  Newly diagnosed well differentiated neuroendocrine carcinoid tumor Duodenal bulb biopsy was pertinent for well-differentiated neuroendocrine (carcinoid) tumor.  Defer management to GI  Chronic normocytic anemia/iron deficiency anemia with acute blood loss Iron studies suggestive of iron deficiency Post colonoscopy Follow-up biopsies Drop in Hg this am 1U prbc ordered to be transfused Repeat CBC in the am  AKI suspect multifactorial Diuretic on hold Need UO to be documented Avoid nephrotoxins Repeat BMP in am  Paroxysmal A. fib Rate controlled on beta-blocker Eliquis for secondary CVA prevention, on hold, resume on 08/01/2019 Monitor H&H after restarting DOAC  Type 2 diabetes with hyperglycemia Latest hemoglobin A1c 8.4 Continue short acting and long-acting insulin. Continue insulin sliding scale  History of nonhemorrhagic CVA Continue current management LDL 59 Restart home lipitor On eliquis for secondary CVA prevention  Morbid obesity BMI 55 Recommend weight loss outpatient with regular physical activity and healthy dieting once stable  GERD Continue PPI  Chronic anxiety/depression Continue cymbalta  Essential HTN BP stable Continue cardiac meds Torsemide on hold due to uptrending creatinine  Chronic sCHF Euvolemic on exam Continue Strict I&O and daily weight Net I&O + 8.7L  Tardive dyskinesia: Has involuntary movement of her tongue and mouth consistent with tardive dyskinesia. Could be associated with Reglan that she was using in the past. Continue supportive care,avoid Reglan as much as possible  Resolved post repletion: Hypomagnesemia Magnesium 1.6> 1.8     Code Status: Full code  Family Communication: None at bedside.    Consultants:  GI  Procedures:  EGD  Colonoscopy  Antimicrobials:  None  DVT  prophylaxis:  .  Restart Eliquis on 08/01/2019  Status is: Inpatient    Dispo: The patient is from: Home.              Anticipated d/c is to: Home in the next 24 to 48 hours              Anticipated d/c date is: 08/02/2019              Patient currently has ongoing abd pain, acute blood loss requiring transfusion, new diagnosis.        Objective: Vitals:   07/31/19 0554 07/31/19 1243 07/31/19 1348 07/31/19 1420  BP: 121/69 130/70 134/75 128/90  Pulse: 96  82 85  Resp: 18  16 14   Temp: (!) 97.5 F (36.4 C)  98 F (36.7 C) 98.2 F (36.8 C)  TempSrc: Oral  Oral Oral  SpO2: 95%  100% 98%  Weight:      Height:        Intake/Output Summary (Last 24 hours) at 07/31/2019 1425 Last data filed at 07/31/2019 0400 Gross per 24 hour  Intake 109.83 ml  Output --  Net 109.83 ml   Filed Weights   07/30/19 0455 07/30/19 0831 07/31/19 0500  Weight: (!) 155.5 kg (!) 155.5 kg (!) 155.5 kg    Exam:  . General: 54 y.o. year-old female obese in no acute distress.  Alert awake and oriented x3.   . Cardiovascular: Regular rate and rhythm no rubs or gallops.   [name] Respiratory: Clear to auscultation with no wheezes or rales.  Good inspiratory effort. . Abdomen: Obese diffuse tenderness with palpation.  Bowel sounds present. . Musculoskeletal: No lower extremity edema bilaterally. [name] Psychiatry: Mood is appropriate for condition and setting.   Data Reviewed: CBC: Recent Labs  Lab 07/25/19 0204 07/25/19 0204 07/26/19 0239 07/26/19 0239 07/27/19 0401 07/28/19 0349 07/29/19 0400 07/30/19 1105 07/31/19 0402  WBC 13.0*   < > 11.4*   < > 12.0* 10.8* 9.6 9.9 10.9*  NEUTROABS 8.5*  --  7.2  --  8.2* 7.1 5.9  --   --   HGB 7.7*   < > 7.1*   < > 7.6*  7.7* 7.5* 7.3* 8.1* 7.3*  HCT 26.4*   < > 23.1*   < > 25.4*  25.4* 25.0* 24.9* 27.0* 24.3*  MCV 90.4   < > 88.8   < > 88.8 89.9 91.9 90.6 92.0  PLT 410*   < > 394   < > 365 376 379 413* 383   < > = values in this interval not displayed.    Basic Metabolic Panel: Recent Labs  Lab 07/25/19 0204 07/26/19 0239 07/27/19 0401 07/28/19 0349 07/29/19 0400 07/30/19 1105 07/31/19 0402  NA 135   < > 135 138 139 137 137  K 3.2*   < > 3.6 3.4* 3.9 3.5 3.3*  CL 99   < > 104 108 108 105 106  CO2 22   < > 26 24 22 24 25   GLUCOSE 297*   < > 163* 186* 234* 148* 189*  BUN 21*   < > 25* 21* 23* 15 19  CREATININE 2.10*   < > 1.34* 1.14* 1.07* 1.03* 1.46*  CALCIUM 8.8*   < > 8.6* 8.4* 8.6* 8.5* 8.8*  MG 1.8  --   --   --   --  1.6* 1.8  PHOS 2.5  --   --   --   --  3.2  --    < > =  values in this interval not displayed.   GFR: Estimated Creatinine Clearance: 67.2 mL/min (A) (by C-G formula based on SCr of 1.46 mg/dL (H)). Liver Function Tests: Recent Labs  Lab 07/25/19 0204 07/26/19 0239 07/27/19 0401  AST 12* 12* 10*  ALT 9 9 9   ALKPHOS 91 85 84  BILITOT 0.5 0.5 0.8  PROT 6.5 6.3* 6.1*  ALBUMIN 3.1* 2.9* 2.8*   No results for input(s): LIPASE, AMYLASE in the last 168 hours. No results for input(s): AMMONIA in the last 168 hours. Coagulation Profile: No results for input(s): INR, PROTIME in the last 168 hours. Cardiac Enzymes: No results for input(s): CKTOTAL, CKMB, CKMBINDEX, TROPONINI in the last 168 hours. BNP (last 3 results) No results for input(s): PROBNP in the last 8760 hours. HbA1C: No results for input(s): HGBA1C in the last 72 hours. CBG: Recent Labs  Lab 07/31/19 0022 07/31/19 0437 07/31/19 0553 07/31/19 0819 07/31/19 1228  GLUCAP 183* 182* 184* 220* 302*   Lipid Profile: No results for input(s): CHOL, HDL, LDLCALC, TRIG, CHOLHDL, LDLDIRECT in the last 72 hours. Thyroid Function Tests: No results for input(s): TSH, T4TOTAL, FREET4, T3FREE, THYROIDAB in the last 72 hours. Anemia Panel: No results for input(s): VITAMINB12, FOLATE, FERRITIN, TIBC, IRON, RETICCTPCT in the last 72 hours. Urine analysis:    Component Value Date/Time   COLORURINE YELLOW 07/21/2019 2335   APPEARANCEUR CLEAR  07/21/2019 2335   LABSPEC 1.012 07/21/2019 2335   PHURINE 6.0 07/21/2019 2335   GLUCOSEU >=500 (A) 07/21/2019 2335   HGBUR SMALL (A) 07/21/2019 2335   BILIRUBINUR NEGATIVE 07/21/2019 2335   KETONESUR 20 (A) 07/21/2019 2335   PROTEINUR >=300 (A) 07/21/2019 2335   UROBILINOGEN 0.2 10/02/2013 2108   NITRITE NEGATIVE 07/21/2019 2335   LEUKOCYTESUR NEGATIVE 07/21/2019 2335   Sepsis Labs: @LABRCNTIP (procalcitonin:4,lacticidven:4)  ) Recent Results (from the past 240 hour(s))  Urine culture     Status: Abnormal   Collection Time: 07/21/19 11:36 PM   Specimen: Urine, Clean Catch  Result Value Ref Range Status   Specimen Description   Final    URINE, CLEAN CATCH Performed at [facility], [place] [address] [place], [place] [address]    Special Requests   Final    NONE Performed at [facility], [place] [address] [place], [place] [address]    Culture MULTIPLE SPECIES PRESENT, SUGGEST RECOLLECTION (A)  Final   Report Status 07/23/2019 FINAL  Final  SARS Coronavirus 2 by RT PCR (hospital order, performed in [facility] hospital lab) Nasopharyngeal Nasopharyngeal Swab     Status: None   Collection Time: 07/22/19 11:11 PM   Specimen: Nasopharyngeal Swab  Result Value Ref Range Status   SARS Coronavirus 2 NEGATIVE NEGATIVE Final    Comment: (NOTE) SARS-CoV-2 target nucleic acids are NOT DETECTED. The SARS-CoV-2 RNA is generally detectable in upper and lower respiratory specimens during the acute phase of infection. The lowest concentration of SARS-CoV-2 viral copies this assay can detect is 250 copies / mL. A negative result does not preclude SARS-CoV-2 infection and should not be used as the sole basis for treatment or other patient management decisions.  A negative result may occur with improper specimen collection / handling, submission of specimen other than nasopharyngeal swab, presence of viral mutation(s) within the areas targeted by this assay,  and inadequate number of viral copies (<250 copies / mL). A negative result must be combined with clinical observations, patient history, and epidemiological information. Fact Sheet for Patients:   StrictlyIdeas.no Fact  Sheet for Healthcare Providers: BankingDealers.co.za This test is not yet approved or cleared  by the [place] and has been authorized for detection and/or diagnosis of SARS-CoV-2 by FDA under an Emergency Use Authorization (EUA).  This EUA will remain in effect (meaning this test can be used) for the duration of the COVID-19 declaration under Section 564(b)(1) of the Act, 21 U.S.C. section 360bbb-3(b)(1), unless the authorization is terminated or revoked sooner. Performed at [facility], [place] [address], [place], [place] [address]   MRSA PCR Screening     Status: None   Collection Time: 07/22/19 11:44 PM   Specimen: Nasopharyngeal  Result Value Ref Range Status   MRSA by PCR NEGATIVE NEGATIVE Final    Comment:        The GeneXpert MRSA Assay (FDA approved for NASAL specimens only), is one component of a comprehensive MRSA colonization surveillance program. It is not intended to diagnose MRSA infection nor to guide or monitor treatment for MRSA infections. Performed at [facility], [place] [address] [place], [place] [address]       Studies: No results found.  Scheduled Meds: . [START ON 08/01/2019] apixaban  5 mg Oral BID  . Chlorhexidine Gluconate Cloth  6 each Topical Daily  . DULoxetine  40 mg Oral Daily  . erythromycin  250 mg Oral TID WC & HS  . hydrALAZINE  5 mg Intravenous Q6H  . insulin aspart  0-15 Units Subcutaneous Q4H  . insulin aspart  5 Units Subcutaneous TID WC  . insulin glargine  35 Units Subcutaneous Daily  . isosorbide mononitrate  60 mg Oral Daily  . metoprolol succinate  50 mg Oral Daily  . pantoprazole  40 mg Oral Daily  . potassium  chloride  40 mEq Oral BID    Continuous Infusions: . sodium chloride 10 mL/hr at 07/26/19 1800     LOS: 9 days     Kayleen Memos, MD [facility] Pager [phone]  If 7PM-7AM, please contact night-coverage [email or web address] Password [email or web address] 07/31/2019, 2:25 PM

## 2019-07-31 NOTE — Progress Notes (Signed)
-  Biopsy findings of well-differentiated neuroendocrine tumor discussed with the patient.  She is tentatively scheduled for EGD for polypectomy on Thursday.  -Hold off on starting anticoagulation tomorrow.  Otis Brace MD, [place] 07/31/2019, 2:56 PM  Contact #  [phone]

## 2019-07-31 NOTE — Progress Notes (Signed)
PT Cancellation Note  Patient Details Name: Deborah [name] MRN: [id number] DOB: 1965-03-19   Cancelled Treatment:    Reason Eval/Treat Not Completed: Patient declined, reports she is near baseline, uses WC at home.;PT screened, no needs identified, will sign off   [name] 07/31/2019, 12:12 PM  [place] Pager [phone] Office [phone]

## 2019-07-31 NOTE — Progress Notes (Signed)
Received call from Dr. Vic Ripper, MD.  Duodenal bulb biopsy was pertinent for well-differentiated neuroendocrine (carcinoid) tumor.   Immunohistochemical stains for synaptophysin and chromogranin are  positive, consistent with the interpretation. Immunostain for Ki-67 is suboptimal for grading and is suggested to be performed on the resection specimen.   Discussed with Dr. Alessandra Bevels and Dr. Watt Climes.  Dr. Watt Climes recommends repeat EGD with snare.  Dr. Alessandra Bevels will follow up with the patient.  Salley Slaughter, PA-C [facility] [phone]

## 2019-08-01 LAB — TYPE AND SCREEN
ABO/RH(D): O POS
Antibody Screen: NEGATIVE
Unit division: 0

## 2019-08-01 LAB — CBC WITH DIFFERENTIAL/PLATELET
Abs Immature Granulocytes: 0.05 10*3/uL (ref 0.00–0.07)
Basophils Absolute: 0.1 10*3/uL (ref 0.0–0.1)
Basophils Relative: 0 %
Eosinophils Absolute: 0.2 10*3/uL (ref 0.0–0.5)
Eosinophils Relative: 2 %
HCT: 26.6 % — ABNORMAL LOW (ref 36.0–46.0)
Hemoglobin: 8.1 g/dL — ABNORMAL LOW (ref 12.0–15.0)
Immature Granulocytes: 0 %
Lymphocytes Relative: 20 %
Lymphs Abs: 2.3 10*3/uL (ref 0.7–4.0)
MCH: 27.4 pg (ref 26.0–34.0)
MCHC: 30.5 g/dL (ref 30.0–36.0)
MCV: 89.9 fL (ref 80.0–100.0)
Monocytes Absolute: 0.7 10*3/uL (ref 0.1–1.0)
Monocytes Relative: 6 %
Neutro Abs: 8 10*3/uL — ABNORMAL HIGH (ref 1.7–7.7)
Neutrophils Relative %: 72 %
Platelets: 372 10*3/uL (ref 150–400)
RBC: 2.96 MIL/uL — ABNORMAL LOW (ref 3.87–5.11)
RDW: 15.9 % — ABNORMAL HIGH (ref 11.5–15.5)
WBC: 11.3 10*3/uL — ABNORMAL HIGH (ref 4.0–10.5)
nRBC: 0 % (ref 0.0–0.2)

## 2019-08-01 LAB — COMPREHENSIVE METABOLIC PANEL
ALT: 9 U/L (ref 0–44)
AST: 9 U/L — ABNORMAL LOW (ref 15–41)
Albumin: 2.7 g/dL — ABNORMAL LOW (ref 3.5–5.0)
Alkaline Phosphatase: 92 U/L (ref 38–126)
Anion gap: 8 (ref 5–15)
BUN: 25 mg/dL — ABNORMAL HIGH (ref 6–20)
CO2: 24 mmol/L (ref 22–32)
Calcium: 8.7 mg/dL — ABNORMAL LOW (ref 8.9–10.3)
Chloride: 104 mmol/L (ref 98–111)
Creatinine, Ser: 1.27 mg/dL — ABNORMAL HIGH (ref 0.44–1.00)
GFR calc Af Amer: 55 mL/min — ABNORMAL LOW (ref >60)
GFR calc non Af Amer: 47 mL/min — ABNORMAL LOW (ref >60)
Glucose, Bld: 247 mg/dL — ABNORMAL HIGH (ref 70–99)
Potassium: 3.7 mmol/L (ref 3.5–5.1)
Sodium: 136 mmol/L (ref 135–145)
Total Bilirubin: 0.4 mg/dL (ref 0.3–1.2)
Total Protein: 6.1 g/dL — ABNORMAL LOW (ref 6.5–8.1)

## 2019-08-01 LAB — BPAM RBC
Blood Product Expiration Date: 202106242359
ISSUE DATE / TIME: 202105251356
Unit Type and Rh: 5100

## 2019-08-01 LAB — GLUCOSE, CAPILLARY
Glucose-Capillary: 190 mg/dL — ABNORMAL HIGH (ref 70–99)
Glucose-Capillary: 212 mg/dL — ABNORMAL HIGH (ref 70–99)
Glucose-Capillary: 248 mg/dL — ABNORMAL HIGH (ref 70–99)
Glucose-Capillary: 255 mg/dL — ABNORMAL HIGH (ref 70–99)
Glucose-Capillary: 256 mg/dL — ABNORMAL HIGH (ref 70–99)

## 2019-08-01 MED ORDER — PROMETHAZINE HCL 25 MG/ML IJ SOLN
12.5000 mg | Freq: Four times a day (QID) | INTRAMUSCULAR | Status: DC | PRN
  Administered 2019-08-01 – 2019-08-03 (×4): 12.5 mg via INTRAVENOUS
  Filled 2019-08-01 (×4): qty 1

## 2019-08-01 NOTE — H&P (View-Only) (Signed)
[facility] Progress Note  Deborah [name] 54 y.o. [birth year]  CC:  Duodenal carcinoid tumor, Gastroparesis   Subjective: Patient reports continued diffuse abdominal pain for which she is taking Morphine at least once per day.  Advised patient that Morphine may make pain and symptoms worse in the setting of gastroparesis.  Patient was irritated with the discussion and was not willing to discontinue morphine.  States she cannot take Tramadol due to tachycardia and cannot take oxycodone due to tremors.  States she has not vomited for a couple days.  ROS : Review of Systems  Cardiovascular: Negative for chest pain and palpitations.  Gastrointestinal: Positive for abdominal pain, nausea and vomiting. Negative for blood in stool, constipation, diarrhea, heartburn and melena.    Objective: Vital signs in last 24 hours: Vitals:   07/31/19 2055 08/01/19 0423  BP: 126/79 123/62  Pulse: 92 94  Resp: 20 18  Temp: 97.8 F (36.6 C) 98.2 F (36.8 C)  SpO2: 97% 95%    Physical Exam:  General:  Lethargic, obese, no distress, appears stated age  Head:  Normocephalic, without obvious abnormality, atraumatic  Eyes:  Anicteric sclera, EOMs intact,   Lungs:   Clear to auscultation bilaterally, respirations unlabored  Heart:  Regular rate and rhythm, S1, S2 normal  Abdomen:   Soft, non-tender, hypoactive bowel sounds, no guarding or peritoneal signs  Extremities: Extremities normal, atraumatic, no  edema  Pulses: 2+ and symmetric    Lab Results: Recent Labs    07/30/19 1105 07/30/19 1105 07/31/19 0402 08/01/19 0130  NA 137   < > 137 136  K 3.5   < > 3.3* 3.7  CL 105   < > 106 104  CO2 24   < > 25 24  GLUCOSE 148*   < > 189* 247*  BUN 15   < > 19 25*  CREATININE 1.03*   < > 1.46* 1.27*  CALCIUM 8.5*   < > 8.8* 8.7*  MG 1.6*  --  1.8  --   PHOS 3.2  --   --   --    < > = values in this interval not displayed.   Recent Labs    08/01/19 0130  AST 9*  ALT 9   ALKPHOS 92  BILITOT 0.4  PROT 6.1*  ALBUMIN 2.7*   Recent Labs    07/31/19 0402 08/01/19 0130  WBC 10.9* 11.3*  NEUTROABS  --  8.0*  HGB 7.3* 8.1*  HCT 24.3* 26.6*  MCV 92.0 89.9  PLT 383 372   No results for input(s): LABPROT, INR in the last 72 hours.   Gastroparesis with chronic nausea and vomiting, delayed gastric emptying per 5/20 study.  Suspect this is related to diabetes; blood glucose remains elevated.  Duodenal nodule: well-differentiated neuroendocrine (carcinoid) tumor.   Chronic kidney disease: Cr 1.21/ BUN 25  Chronic anemia, Hgb decreased to 8.1 today.  No signs of GI bleeding.    Leukocytosis,improvingWBCs 11.3today  Plan: -EGD tomorrow with polypectomy -ContinueZofran 4mg  q6h PRNas needed for nausea and vomiting. -Continuelow residue diet with small portions. -Continue erythromycin  -Limit narcotics.  [facility] will follow.  Salley Slaughter PA-C 08/01/2019, 9:25 AM  Contact #  [phone]

## 2019-08-01 NOTE — Progress Notes (Signed)
PROGRESS NOTE    Deborah [name]  WOE:[id number] DOB: 11-25-1965 DOA: 07/21/2019 PCP: [name], DO   Chef Complaints: nausea/vomitting  Brief Narrative: 54 year old female with history of depression, nonhemorrhagic CVA, chronic systolic CHF PAF on Eliquis, acute distress 3 8, hypertension, gout, GERD/gastric ulcer, anemia of chronic disease T2DM with recurrent admission for DKA/diabetic gastroparesis presented with nausea vomiting diffuse abdominal pain.  Recently was admitted for gastroparesis earlier this month GI.  Patient on presentation had tachycardia tachypnea and lactic acidosis was admitted, seen by GI.  Underwent GES which was abnormal then EGD to rule out gastric outlet obstruction but no acute finding, underwent colonoscopy subsequently that showed 2 to 3 mm polyp in the descending colon which was biopsied,. Biopsy findings showed well-differentiated neuroendocrine tumor and GI is tentatively planning for scheduled endoscopy for polypectomy on Thursday 5/27 with instruction to hold anticoagulation until then  Subjective: Vitals stable. No Fever. No more vomiting and nausea. Does not want to try po and taking prn morphine   Assessment & Plan:  Intractable nausea and vomiting abdominal pain due to diabetic gastroparesis: Underwent extensive evaluation with GES, EGD 5/21 and colonoscopy.  And has been declining oxycodone and she is on IV morphine.  Weightbearing as tolerated continue supportive care  Newly diagnosed well-differentiated neuroendocrine tumor per biopsy results:GI is tentatively planning for scheduled endoscopy for polypectomy on Thursday 5/27 with instruction to hold anticoagulation until then  Uncontrolled type 2 diabetes mellitus with diabetic neuropathy, with long-term current use of insulin poorly controlled hemoglobin A1c 8.7 on 5/16.  Blood sugar poorly controlled. Recent Labs  Lab 07/31/19 0819 07/31/19 1228 07/31/19 1619 07/31/19 2107  08/01/19 0726  GLUCAP 220* 302* 320* 261* 212*   AKI on CKD (chronic kidney disease), stage IIIa: Felt to be multifactorial.  Diuretics on hold.  Avoid nephrotoxin monitor renal function.  Encourage oral hydration. Recent Labs  Lab 07/28/19 0349 07/29/19 0400 07/30/19 1105 07/31/19 0402 08/01/19 0130  BUN 21* 23* 15 19 25*  CREATININE 1.14* 1.07* 1.03* 1.46* 1.27*   Anemia of chronic disease/chronic microcytic anemia iron deficiency anemia with acute blood loss: Status post 1 unit PRBC.  Hemoglobin overall stable.  Monitor Recent Labs  Lab 07/28/19 0349 07/29/19 0400 07/30/19 1105 07/31/19 0402 08/01/19 0130  HGB 7.5* 7.3* 8.1* 7.3* 8.1*  HCT 25.0* 24.9* 27.0* 24.3* 26.6*   PAF/Essential hypertension, benign: Rate controlled, pressure stable continue metoprolol, Imdur and hydralazine IV. ELIQUIS is on hold for EGD Eliquis is on hold  Morbid obesity with BMI 55: Will benefit with weight loss PCP follow-up lifestyle change  Chronic systolic CHF: She is net positive +9 L likley not correct since patient has had unmeasured urine output multiple times.  Weight appears stable compared to from April.  Diuretics on hold. Wt Readings from Last 3 Encounters:  07/31/19 (!) 155.5 kg  07/19/19 (!) 151.1 kg  07/04/19 (!) 156.1 kg   Drug-seeking behavior-limit opiates  GERD continue PPI  Hemorrhagic CVA continue Lipitor, LDL 37, resume Eliquis once okay with GI after the procedure  Chronic anxiety/depression continue Cymbalta  Tardive dyskinesia history with involuntary movements of her tongue and mouth-avoid Reglan was on Reglan in the past.  Hypomagnesia Improved  DVT prophylaxis:SCD while not able to take Eliquis. Code Status:full Family Communication: plan of care discussed with patient at bedside.  Status is: Inpatient Remains inpatient appropriate because:For ongoing work-up and endoscopy for tomorrow  and remains hospitalized until cleared with GI  Dispo: The patient is  from:  Home              Anticipated d/c is to: Home              Anticipated d/c date is: 2 days              Patient currently is not medically stable to d/c.  Diet Order            Diet NPO time specified  Diet effective midnight        Diet Carb Modified Fluid consistency: Thin; Room service appropriate? Yes  Diet effective now               Body mass index is 55.33 kg/m.  Consultants: GI Procedures: EGD, colonoscopy.    Microbiology:see note  Medications: Scheduled Meds: . atorvastatin  10 mg Oral Daily  . Chlorhexidine Gluconate Cloth  6 each Topical Daily  . DULoxetine  40 mg Oral Daily  . erythromycin  250 mg Oral TID WC & HS  . ferrous sulfate  325 mg Oral Q breakfast  . hydrALAZINE  5 mg Intravenous Q6H  . insulin aspart  0-20 Units Subcutaneous TID WC  . insulin aspart  0-5 Units Subcutaneous QHS  . insulin aspart  5 Units Subcutaneous TID WC  . insulin glargine  35 Units Subcutaneous Daily  . isosorbide mononitrate  60 mg Oral Daily  . metoprolol succinate  50 mg Oral Daily  . pantoprazole  40 mg Oral Daily   Continuous Infusions: . sodium chloride 10 mL/hr at 07/26/19 1800    Antimicrobials: Anti-infectives (From admission, onward)   Start     Dose/Rate Route Frequency Ordered Stop   07/26/19 1700  erythromycin (E-MYCIN) tablet 250 mg     250 mg Oral 3 times daily with meals & bedtime 07/26/19 1312     07/24/19 1700  erythromycin (E-MYCIN) tablet 250 mg  Status:  Discontinued     250 mg Oral 3 times daily with meals & bedtime 07/24/19 1205 07/25/19 0838   07/22/19 0800  erythromycin (E-MYCIN) tablet 250 mg  Status:  Discontinued     250 mg Oral 3 times daily with meals & bedtime 07/22/19 0723 07/23/19 1507   07/22/19 0800  ciprofloxacin (CIPRO) IVPB 400 mg  Status:  Discontinued     400 mg 200 mL/hr over 60 Minutes Intravenous Every 12 hours 07/22/19 0734 07/23/19 0833   07/22/19 0800  metroNIDAZOLE (FLAGYL) IVPB 500 mg  Status:  Discontinued      500 mg 100 mL/hr over 60 Minutes Intravenous Every 8 hours 07/22/19 0734 07/23/19 0833       Objective: Vitals: Today's Vitals   07/31/19 1814 07/31/19 2055 08/01/19 0120 08/01/19 0423  BP:  126/79  123/62  Pulse:  92  94  Resp:  20  18  Temp:  97.8 F (36.6 C)  98.2 F (36.8 C)  TempSrc:  Oral  Oral  SpO2:  97%  95%  Weight:      Height:      PainSc: 8   6      Intake/Output Summary (Last 24 hours) at 08/01/2019 1134 Last data filed at 08/01/2019 0900 Gross per 24 hour  Intake 442.34 ml  Output --  Net 442.34 ml   Filed Weights   07/30/19 0455 07/30/19 0831 07/31/19 0500  Weight: (!) 155.5 kg (!) 155.5 kg (!) 155.5 kg   Weight change:    Intake/Output from previous day: 05/25 0701 - 05/26 0700 In:  441.3 [I.V.:126.3; Blood:315] Out: -  Intake/Output this shift: Total I/O In: 1 [P.O.:1] Out: -   Examination:  General exam: AAO x3, obese, NAD, weak appearing. HEENT:Oral mucosa moist, Ear/Nose WNL grossly,dentition normal. Respiratory system: bilaterally clear,no wheezing or crackles,no use of accessory muscle, non tender. Cardiovascular system: S1 & S2 +, regular, No JVD. Gastrointestinal system: Abdomen soft, NT,ND, BS+. Nervous System:Alert, awake, moving extremities and grossly nonfocal Extremities: No edema, distal peripheral pulses palpable.  Skin: No rashes,no icterus. MSK: Normal muscle bulk,tone, power  Data Reviewed: I have personally reviewed following labs and imaging studies CBC: Recent Labs  Lab 07/26/19 0239 07/26/19 0239 07/27/19 0401 07/27/19 0401 07/28/19 0349 07/29/19 0400 07/30/19 1105 07/31/19 0402 08/01/19 0130  WBC 11.4*   < > 12.0*   < > 10.8* 9.6 9.9 10.9* 11.3*  NEUTROABS 7.2  --  8.2*  --  7.1 5.9  --   --  8.0*  HGB 7.1*   < > 7.6*  7.7*   < > 7.5* 7.3* 8.1* 7.3* 8.1*  HCT 23.1*   < > 25.4*  25.4*   < > 25.0* 24.9* 27.0* 24.3* 26.6*  MCV 88.8   < > 88.8   < > 89.9 91.9 90.6 92.0 89.9  PLT 394   < > 365   < > 376 379  413* 383 372   < > = values in this interval not displayed.   Basic Metabolic Panel: Recent Labs  Lab 07/28/19 0349 07/29/19 0400 07/30/19 1105 07/31/19 0402 08/01/19 0130  NA 138 139 137 137 136  K 3.4* 3.9 3.5 3.3* 3.7  CL 108 108 105 106 104  CO2 24 22 24 25 24   GLUCOSE 186* 234* 148* 189* 247*  BUN 21* 23* 15 19 25*  CREATININE 1.14* 1.07* 1.03* 1.46* 1.27*  CALCIUM 8.4* 8.6* 8.5* 8.8* 8.7*  MG  --   --  1.6* 1.8  --   PHOS  --   --  3.2  --   --    GFR: Estimated Creatinine Clearance: 77.3 mL/min (A) (by C-G formula based on SCr of 1.27 mg/dL (H)). Liver Function Tests: Recent Labs  Lab 07/26/19 0239 07/27/19 0401 08/01/19 0130  AST 12* 10* 9*  ALT 9 9 9   ALKPHOS 85 84 92  BILITOT 0.5 0.8 0.4  PROT 6.3* 6.1* 6.1*  ALBUMIN 2.9* 2.8* 2.7*   No results for input(s): LIPASE, AMYLASE in the last 168 hours. No results for input(s): AMMONIA in the last 168 hours. Coagulation Profile: No results for input(s): INR, PROTIME in the last 168 hours. Cardiac Enzymes: No results for input(s): CKTOTAL, CKMB, CKMBINDEX, TROPONINI in the last 168 hours. BNP (last 3 results) No results for input(s): PROBNP in the last 8760 hours. HbA1C: No results for input(s): HGBA1C in the last 72 hours. CBG: Recent Labs  Lab 07/31/19 0819 07/31/19 1228 07/31/19 1619 07/31/19 2107 08/01/19 0726  GLUCAP 220* 302* 320* 261* 212*   Lipid Profile: No results for input(s): CHOL, HDL, LDLCALC, TRIG, CHOLHDL, LDLDIRECT in the last 72 hours. Thyroid Function Tests: No results for input(s): TSH, T4TOTAL, FREET4, T3FREE, THYROIDAB in the last 72 hours. Anemia Panel: No results for input(s): VITAMINB12, FOLATE, FERRITIN, TIBC, IRON, RETICCTPCT in the last 72 hours. Sepsis Labs: No results for input(s): PROCALCITON, LATICACIDVEN in the last 168 hours.  Recent Results (from the past 240 hour(s))  SARS Coronavirus 2 by RT PCR (hospital order, performed in [facility] hospital lab)  Nasopharyngeal Nasopharyngeal Swab  Status: None   Collection Time: 07/22/19 11:11 PM   Specimen: Nasopharyngeal Swab  Result Value Ref Range Status   SARS Coronavirus 2 NEGATIVE NEGATIVE Final    Comment: (NOTE) SARS-CoV-2 target nucleic acids are NOT DETECTED. The SARS-CoV-2 RNA is generally detectable in upper and lower respiratory specimens during the acute phase of infection. The lowest concentration of SARS-CoV-2 viral copies this assay can detect is 250 copies / mL. A negative result does not preclude SARS-CoV-2 infection and should not be used as the sole basis for treatment or other patient management decisions.  A negative result may occur with improper specimen collection / handling, submission of specimen other than nasopharyngeal swab, presence of viral mutation(s) within the areas targeted by this assay, and inadequate number of viral copies (<250 copies / mL). A negative result must be combined with clinical observations, patient history, and epidemiological information. Fact Sheet for Patients:   StrictlyIdeas.no Fact Sheet for Healthcare Providers: BankingDealers.co.za This test is not yet approved or cleared  by the [place] FDA and has been authorized for detection and/or diagnosis of SARS-CoV-2 by FDA under an Emergency Use Authorization (EUA).  This EUA will remain in effect (meaning this test can be used) for the duration of the COVID-19 declaration under Section 564(b)(1) of the Act, 21 U.S.C. section 360bbb-3(b)(1), unless the authorization is terminated or revoked sooner. Performed at [facility], [place] [address], [place], [place] [address]   MRSA PCR Screening     Status: None   Collection Time: 07/22/19 11:44 PM   Specimen: Nasopharyngeal  Result Value Ref Range Status   MRSA by PCR NEGATIVE NEGATIVE Final    Comment:        The GeneXpert MRSA Assay (FDA approved for NASAL  specimens only), is one component of a comprehensive MRSA colonization surveillance program. It is not intended to diagnose MRSA infection nor to guide or monitor treatment for MRSA infections. Performed at [facility], [place] [address], [place],  [address]       Radiology Studies: No results found.   LOS: 10 days   Antonieta Pert, MD [facility]  08/01/2019, 11:34 AM

## 2019-08-01 NOTE — Progress Notes (Signed)
Patient called out requesting for nausea and pain medication. Patient was in NAD, resting comfortably in the chair upon entering the room.  Patient refused oxycodone stating that it causes her tremors and requested she receive only morphine. Morphine given to patient after educating patient that it could possibly make her symptoms of gastroparesis worse.  Patient remains comfortable in the chair.  Will continue to monitor.

## 2019-08-01 NOTE — Progress Notes (Signed)
[facility] Progress Note  Deborah [name] 54 y.o. April 17, 1965  CC:  Duodenal carcinoid tumor, Gastroparesis   Subjective: Patient reports continued diffuse abdominal pain for which she is taking Morphine at least once per day.  Advised patient that Morphine may make pain and symptoms worse in the setting of gastroparesis.  Patient was irritated with the discussion and was not willing to discontinue morphine.  States she cannot take Tramadol due to tachycardia and cannot take oxycodone due to tremors.  States she has not vomited for a couple days.  ROS : Review of Systems  Cardiovascular: Negative for chest pain and palpitations.  Gastrointestinal: Positive for abdominal pain, nausea and vomiting. Negative for blood in stool, constipation, diarrhea, heartburn and melena.    Objective: Vital signs in last 24 hours: Vitals:   07/31/19 2055 08/01/19 0423  BP: 126/79 123/62  Pulse: 92 94  Resp: 20 18  Temp: 97.8 F (36.6 C) 98.2 F (36.8 C)  SpO2: 97% 95%    Physical Exam:  General:  Lethargic, obese, no distress, appears stated age  Head:  Normocephalic, without obvious abnormality, atraumatic  Eyes:  Anicteric sclera, EOMs intact,   Lungs:   Clear to auscultation bilaterally, respirations unlabored  Heart:  Regular rate and rhythm, S1, S2 normal  Abdomen:   Soft, non-tender, hypoactive bowel sounds, no guarding or peritoneal signs  Extremities: Extremities normal, atraumatic, no  edema  Pulses: 2+ and symmetric    Lab Results: Recent Labs    07/30/19 1105 07/30/19 1105 07/31/19 0402 08/01/19 0130  NA 137   < > 137 136  K 3.5   < > 3.3* 3.7  CL 105   < > 106 104  CO2 24   < > 25 24  GLUCOSE 148*   < > 189* 247*  BUN 15   < > 19 25*  CREATININE 1.03*   < > 1.46* 1.27*  CALCIUM 8.5*   < > 8.8* 8.7*  MG 1.6*  --  1.8  --   PHOS 3.2  --   --   --    < > = values in this interval not displayed.   Recent Labs    08/01/19 0130  AST 9*  ALT 9   ALKPHOS 92  BILITOT 0.4  PROT 6.1*  ALBUMIN 2.7*   Recent Labs    07/31/19 0402 08/01/19 0130  WBC 10.9* 11.3*  NEUTROABS  --  8.0*  HGB 7.3* 8.1*  HCT 24.3* 26.6*  MCV 92.0 89.9  PLT 383 372   No results for input(s): LABPROT, INR in the last 72 hours.   Gastroparesis with chronic nausea and vomiting, delayed gastric emptying per 5/20 study.  Suspect this is related to diabetes; blood glucose remains elevated.  Duodenal nodule: well-differentiated neuroendocrine (carcinoid) tumor.   Chronic kidney disease: Cr 1.21/ BUN 25  Chronic anemia, Hgb decreased to 8.1 today.  No signs of GI bleeding.    Leukocytosis,improvingWBCs 11.3today  Plan: -EGD tomorrow with polypectomy -ContinueZofran 4mg  q6h PRNas needed for nausea and vomiting. -Continuelow residue diet with small portions. -Continue erythromycin  -Limit narcotics.  [facility] will follow.  Salley Slaughter PA-C 08/01/2019, 9:25 AM  Contact #  [phone]

## 2019-08-01 NOTE — Progress Notes (Signed)
Patient has had 1 episode of emesis. Patient had Zofran at 1801.  PCP on call was notified

## 2019-08-02 ENCOUNTER — Inpatient Hospital Stay (HOSPITAL_COMMUNITY): Payer: Medicare Other | Admitting: Certified Registered Nurse Anesthetist

## 2019-08-02 ENCOUNTER — Encounter (HOSPITAL_COMMUNITY)
Admission: EM | Disposition: A | Discharge status: Home / Self Care | Payer: Self-pay | Source: Home / Self Care | Attending: Internal Medicine

## 2019-08-02 ENCOUNTER — Encounter (HOSPITAL_COMMUNITY): Payer: Self-pay | Admitting: Internal Medicine

## 2019-08-02 HISTORY — PX: HEMOSTASIS CLIP PLACEMENT: SHX6857

## 2019-08-02 HISTORY — PX: POLYPECTOMY: SHX5525

## 2019-08-02 HISTORY — PX: ESOPHAGOGASTRODUODENOSCOPY (EGD) WITH PROPOFOL: SHX5813

## 2019-08-02 LAB — CBC WITH DIFFERENTIAL/PLATELET
Abs Immature Granulocytes: 0.05 10*3/uL (ref 0.00–0.07)
Basophils Absolute: 0.1 10*3/uL (ref 0.0–0.1)
Basophils Relative: 0 %
Eosinophils Absolute: 0.3 10*3/uL (ref 0.0–0.5)
Eosinophils Relative: 3 %
HCT: 26.1 % — ABNORMAL LOW (ref 36.0–46.0)
Hemoglobin: 8.1 g/dL — ABNORMAL LOW (ref 12.0–15.0)
Immature Granulocytes: 0 %
Lymphocytes Relative: 19 %
Lymphs Abs: 2.1 10*3/uL (ref 0.7–4.0)
MCH: 27.9 pg (ref 26.0–34.0)
MCHC: 31 g/dL (ref 30.0–36.0)
MCV: 90 fL (ref 80.0–100.0)
Monocytes Absolute: 0.7 10*3/uL (ref 0.1–1.0)
Monocytes Relative: 6 %
Neutro Abs: 7.9 10*3/uL — ABNORMAL HIGH (ref 1.7–7.7)
Neutrophils Relative %: 72 %
Platelets: 389 10*3/uL (ref 150–400)
RBC: 2.9 MIL/uL — ABNORMAL LOW (ref 3.87–5.11)
RDW: 15.9 % — ABNORMAL HIGH (ref 11.5–15.5)
WBC: 11.1 10*3/uL — ABNORMAL HIGH (ref 4.0–10.5)
nRBC: 0 % (ref 0.0–0.2)

## 2019-08-02 LAB — COMPREHENSIVE METABOLIC PANEL
ALT: 8 U/L (ref 0–44)
AST: 10 U/L — ABNORMAL LOW (ref 15–41)
Albumin: 2.7 g/dL — ABNORMAL LOW (ref 3.5–5.0)
Alkaline Phosphatase: 87 U/L (ref 38–126)
Anion gap: 11 (ref 5–15)
BUN: 24 mg/dL — ABNORMAL HIGH (ref 6–20)
CO2: 24 mmol/L (ref 22–32)
Calcium: 8.6 mg/dL — ABNORMAL LOW (ref 8.9–10.3)
Chloride: 103 mmol/L (ref 98–111)
Creatinine, Ser: 1.05 mg/dL — ABNORMAL HIGH (ref 0.44–1.00)
GFR calc Af Amer: >60 mL/min (ref >60)
GFR calc non Af Amer: 60 mL/min — ABNORMAL LOW (ref >60)
Glucose, Bld: 183 mg/dL — ABNORMAL HIGH (ref 70–99)
Potassium: 3.7 mmol/L (ref 3.5–5.1)
Sodium: 138 mmol/L (ref 135–145)
Total Bilirubin: 0.7 mg/dL (ref 0.3–1.2)
Total Protein: 6.2 g/dL — ABNORMAL LOW (ref 6.5–8.1)

## 2019-08-02 LAB — GLUCOSE, CAPILLARY
Glucose-Capillary: 184 mg/dL — ABNORMAL HIGH (ref 70–99)
Glucose-Capillary: 177 mg/dL — ABNORMAL HIGH (ref 70–99)
Glucose-Capillary: 150 mg/dL — ABNORMAL HIGH (ref 70–99)
Glucose-Capillary: 206 mg/dL — ABNORMAL HIGH (ref 70–99)
Glucose-Capillary: 215 mg/dL — ABNORMAL HIGH (ref 70–99)

## 2019-08-02 SURGERY — ESOPHAGOGASTRODUODENOSCOPY (EGD) WITH PROPOFOL
Anesthesia: Monitor Anesthesia Care

## 2019-08-02 MED ORDER — PROPOFOL 10 MG/ML IV BOLUS
INTRAVENOUS | Status: DC | PRN
  Administered 2019-08-02 (×2): 30 mg via INTRAVENOUS

## 2019-08-02 MED ORDER — PROPOFOL 500 MG/50ML IV EMUL
INTRAVENOUS | Status: DC | PRN
  Administered 2019-08-02: 125 ug/kg/min via INTRAVENOUS

## 2019-08-02 MED ORDER — LACTATED RINGERS IV SOLN: INTRAVENOUS | Status: DC

## 2019-08-02 MED ORDER — SODIUM CHLORIDE 0.9 % IV SOLN: INTRAVENOUS | Status: DC

## 2019-08-02 SURGICAL SUPPLY — 15 items

## 2019-08-02 NOTE — Anesthesia Postprocedure Evaluation (Signed)
Anesthesia Post Note  Patient: Deborah [name]  Procedure(s) Performed: ESOPHAGOGASTRODUODENOSCOPY (EGD) WITH PROPOFOL (N/A ) POLYPECTOMY HEMOSTASIS CLIP PLACEMENT     Patient location during evaluation: PACU Anesthesia Type: MAC Level of consciousness: awake and alert and oriented Pain management: pain level controlled Vital Signs Assessment: post-procedure vital signs reviewed and stable Respiratory status: spontaneous breathing, nonlabored ventilation and respiratory function stable Cardiovascular status: stable and blood pressure returned to baseline Postop Assessment: no apparent nausea or vomiting Anesthetic complications: no    Last Vitals:  Vitals:   08/02/19 0920 08/02/19 0930  BP: (!) 149/84 (!) 152/69  Pulse: 95 94  Resp: 14 16  Temp:    SpO2: 100% 96%    Last Pain:  Vitals:   08/02/19 0930  TempSrc:   PainSc: 2                  Beyonka Pitney A.

## 2019-08-02 NOTE — Transfer of Care (Signed)
Immediate Anesthesia Transfer of Care Note  Patient: Deborah [name]  Procedure(s) Performed: ESOPHAGOGASTRODUODENOSCOPY (EGD) WITH PROPOFOL (N/A ) POLYPECTOMY HEMOSTASIS CLIP PLACEMENT  Patient Location: PACU  Anesthesia Type:MAC  Level of Consciousness: awake and patient cooperative  Airway & Oxygen Therapy: Patient Spontanous Breathing and Patient connected to face mask  Post-op Assessment: Report given to RN and Post -op Vital signs reviewed and stable  Post vital signs: Reviewed and stable  Last Vitals:  Vitals Value Taken Time  BP    Temp    Pulse 95 08/02/19 0919  Resp 14 08/02/19 0919  SpO2 100 % 08/02/19 0919  Vitals shown include unvalidated device data.  Last Pain:  Vitals:   08/02/19 0801  TempSrc: Oral  PainSc: 7       Patients Stated Pain Goal: 3 (97/98/92 1194)  Complications: No apparent anesthesia complications

## 2019-08-02 NOTE — Op Note (Signed)
[facility] Patient Name: Deborah [name] Procedure Date: 08/02/2019 MRN: [id number] Attending MD: Otis Brace , MD Date of Birth: Mar 22, 1965 [name]: [id number] Age: 54 Admit Type: Inpatient Procedure:                Upper GI endoscopy Indications:              For therapy of polyps in the duodenum Providers:                Otis Brace, MD, Cleda Daub, RN, Elspeth Cho Tech., Technician, Dellie Catholic Referring MD:              Medicines:                Sedation Administered by an Anesthesia Professional Complications:            No immediate complications. Estimated Blood Loss:     Estimated blood loss was minimal. Procedure:                Pre-Anesthesia Assessment:                           - Prior to the procedure, a History and Physical                            was performed, and patient medications and                            allergies were reviewed. The patient's tolerance of                            previous anesthesia was also reviewed. The risks                            and benefits of the procedure and the sedation                            options and risks were discussed with the patient.                            All questions were answered, and informed consent                            was obtained. Prior Anticoagulants: The patient has                            taken Xarelto (rivaroxaban), last dose was 4 days                            prior to procedure. ASA Grade Assessment: III - A                            patient with severe systemic disease. After  reviewing the risks and benefits, the patient was                            deemed in satisfactory condition to undergo the                            procedure.                           After obtaining informed consent, the endoscope was                            passed under direct vision. Throughout the                             procedure, the patient's blood pressure, pulse, and                            oxygen saturations were monitored continuously. The                            [id number]-H190 ([id number] Olympus gastroscope was                            introduced through the mouth, and advanced to the                            second part of duodenum. The upper GI endoscopy was                            technically difficult and complex due to the                            patient's body habitus. The patient tolerated the                            procedure well. Scope In: Scope Out: Findings:      The Z-line was regular and was found 36 cm from the incisors.      Normal mucosa was found in the entire examined stomach.      The cardia and gastric fundus were normal on retroflexion.      One non-bleeding superficial duodenal ulcer with no stigmata of bleeding       was found in the duodenal bulb. The lesion was 4 mm in largest dimension.      A single 12 mm sessile polyp with bleeding was found in the duodenal       bulb. The polyp was removed with a piecemeal technique using a hot       snare. Resection and retrieval were complete. To close a defect after       polypectomy, one hemostatic clip was successfully placed. There was no       bleeding at the end of the procedure. Procedure was difficult because of       position of the polyp as well as patient's diaphragmatic breathing.       Multiple different  snares were used.      The second portion of the duodenum was normal. Impression:               - Z-line regular, 36 cm from the incisors.                           - Normal mucosa was found in the entire stomach.                           - Non-bleeding duodenal ulcer with no stigmata of                            bleeding.                           - A single duodenal polyp. Resected and retrieved.                            Clip was placed.                           - Normal second portion  of the duodenum. Moderate Sedation:      Moderate (conscious) sedation was personally administered by an       anesthesia professional. The following parameters were monitored: oxygen       saturation, heart rate, blood pressure, and response to care. Recommendation:           - Return patient to hospital ward for ongoing care.                           - Full liquid diet.                           - Continue present medications.                           - Resume Eliquis (apixaban) at prior dose in 2 days.                           - Await pathology results.                           - Repeat upper endoscopy in 3 months to evaluate                            the response to therapy. Procedure Code(s):        --- Professional ---                           [phone], Esophagogastroduodenoscopy, flexible,                            transoral; with removal of tumor(s), polyp(s), or                            other lesion(s) by snare technique Diagnosis Code(s):        ---  Professional ---                           K26.9, Duodenal ulcer, unspecified as acute or                            chronic, without hemorrhage or perforation                           K31.7, Polyp of stomach and duodenum CPT copyright 2019 [employer]. All rights reserved. The codes documented in this report are preliminary and upon coder review may  be revised to meet current compliance requirements. Otis Brace, MD Otis Brace, MD 08/02/2019 9:23:07 AM Number of Addenda: 0

## 2019-08-02 NOTE — Progress Notes (Addendum)
PROGRESS NOTE    Deborah [name]  VQQ:[id number] DOB: 1965-06-13 DOA: 07/21/2019 PCP: [name], DO   Chef Complaints: nausea/vomitting  Brief Narrative: 54 year old female with history of depression, nonhemorrhagic CVA, chronic systolic CHF PAF on Eliquis, acute distress 3 8, hypertension, gout, GERD/gastric ulcer, anemia of chronic disease T2DM with recurrent admission for DKA/diabetic gastroparesis presented with nausea vomiting diffuse abdominal pain.  Recently was admitted for gastroparesis earlier this month GI.  Patient on presentation had tachycardia tachypnea and lactic acidosis was admitted, seen by GI.  Underwent GES which was abnormal then EGD to rule out gastric outlet obstruction but no acute finding, underwent colonoscopy subsequently that showed 2 to 3 mm polyp in the descending colon which was biopsied,. Biopsy findings showed well-differentiated neuroendocrine tumor and GI is tentatively planning for scheduled endoscopy for polypectomy on Thursday 5/27 with instruction to hold anticoagulation until then  Subjective: Seen this morning just came back from endoscopy appears groggy complains of abdominal pain and discomfort.  Assessment & Plan:  Intractable nausea vomiting abdominal pain due to diabetic gastroparesis : Underwent extensive evaluation with GES, EGD 5/21 and colonoscopy.  And has been declining oxycodone and she is on IV morphine.  Start liquid diet post endoscopy today and advance slowly per GI  Newly diagnosed well-differentiated neuroendocrine tumor per biopsy results: Underwent EGD single duodenal polyp was resected and retrieved Endo Clip was placed.   Uncontrolled type 2 diabetes mellitus with diabetic neuropathy, with long-term current use of insulin poorly controlled hemoglobin A1c 8.7 on 5/16.  Blood sugar poorly controlled. Recent Labs  Lab 08/01/19 1800 08/01/19 1931 08/01/19 2337 08/02/19 0547 08/02/19 0948  GLUCAP 256* 255* 248* 184*  177*   AKI on CKD (chronic kidney disease), stage IIIa: Felt to be multifactorial.  Diuretics on hold.  Avoid nephrotoxin monitor renal function.  Encourage oral hydration. Recent Labs  Lab 07/29/19 0400 07/30/19 1105 07/31/19 0402 08/01/19 0130 08/02/19 0352  BUN 23* 15 19 25* 24*  CREATININE 1.07* 1.03* 1.46* 1.27* 1.05*   Anemia of chronic disease/chronic microcytic anemia iron deficiency anemia with acute blood loss: Status post 1 unit PRBC.  Hemoglobin overall stable.  Monitor Recent Labs  Lab 07/29/19 0400 07/30/19 1105 07/31/19 0402 08/01/19 0130 08/02/19 0352  HGB 7.3* 8.1* 7.3* 8.1* 8.1*  HCT 24.9* 27.0* 24.3* 26.6* 26.1*   PAF/Essential hypertension, benign: Rate controlled, pressure stable continue metoprolol, Imdur and hydralazine IV. ELIQUIS to resume in 2 days  Morbid obesity with BMI 55: Will benefit with weight loss PCP follow-up lifestyle change  Chronic systolic CHF: She is net positive +9.8 L likley not correct since patient has had unmeasured urine output multiple times.  Weight appears stable compared to from April.  Diuretics on hold. Wt Readings from Last 3 Encounters:  08/02/19 (!) 157 kg  07/19/19 (!) 151.1 kg  07/04/19 (!) 156.1 kg   Drug-seeking behavior-limit opiates  GERD continue PPI  Hemorrhagic CVA continue Lipitor, LDL 37, resume Eliquis once okay with GI after the procedure in 2 days  Chronic anxiety/depression continue Cymbalta  Tardive dyskinesia history with involuntary movements of her tongue and mouth-avoid Reglan was on Reglan in the past.  Hypomagnesia Improved  DVT prophylaxis:SCD while not able to take Eliquis. Code Status:full Family Communication: plan of care discussed with patient at bedside.  Status is: Inpatient Remains inpatient appropriate because starting patient back on liquid diet today post endoscopy and anticipating  discharge tomorrow if okay with GI and if tolerating diet.  Dispo: The  patient is from:  Home              Anticipated d/c is to: Home              Anticipated d/c date is 1 day              Patient currently is not medically stable to d/c.  Diet Order            Diet clear liquid Room service appropriate? Yes; Fluid consistency: Thin  Diet effective now               Body mass index is 55.87 kg/m.  Consultants: GI Procedures: EGD, colonoscopy.   EGD 5/27  IMPRESSION: Z-line regular, 36 cm from the incisors.                           - Normal mucosa was found in the entire stomach.                           - Non-bleeding duodenal ulcer with no stigmata of                            bleeding.                           - A single duodenal polyp. Resected and retrieved.                            Clip was placed.                           - Normal second portion of the duodenum. Moderate Sedation:      Moderate (conscious) sedation was personally administered by an       anesthesia professional. The following parameters were monitored: oxygen       saturation, heart rate, blood pressure, and response to care. Recommendation:           - Return patient to hospital ward for ongoing care.                           - Full liquid diet.                           - Continue present medications.                           - Resume Eliquis (apixaban) at prior dose in 2 days.                           - Await pathology results.                           - Repeat upper endoscopy in 3 months to evaluate                            the response to therapy. Microbiology:see note  Medications: Scheduled Meds: . atorvastatin  10 mg Oral Daily  . DULoxetine  40 mg Oral  Daily  . erythromycin  250 mg Oral TID WC & HS  . ferrous sulfate  325 mg Oral Q breakfast  . hydrALAZINE  5 mg Intravenous Q6H  . insulin aspart  0-20 Units Subcutaneous TID WC  . insulin aspart  0-5 Units Subcutaneous QHS  . insulin aspart  5 Units Subcutaneous TID WC  . insulin glargine  35 Units  Subcutaneous Daily  . isosorbide mononitrate  60 mg Oral Daily  . metoprolol succinate  50 mg Oral Daily  . pantoprazole  40 mg Oral Daily   Continuous Infusions: . sodium chloride 10 mL/hr at 07/26/19 1800    Antimicrobials: Anti-infectives (From admission, onward)   Start     Dose/Rate Route Frequency Ordered Stop   07/26/19 1700  erythromycin (E-MYCIN) tablet 250 mg     250 mg Oral 3 times daily with meals & bedtime 07/26/19 1312     07/24/19 1700  erythromycin (E-MYCIN) tablet 250 mg  Status:  Discontinued     250 mg Oral 3 times daily with meals & bedtime 07/24/19 1205 07/25/19 0838   07/22/19 0800  erythromycin (E-MYCIN) tablet 250 mg  Status:  Discontinued     250 mg Oral 3 times daily with meals & bedtime 07/22/19 0723 07/23/19 1507   07/22/19 0800  ciprofloxacin (CIPRO) IVPB 400 mg  Status:  Discontinued     400 mg 200 mL/hr over 60 Minutes Intravenous Every 12 hours 07/22/19 0734 07/23/19 0833   07/22/19 0800  metroNIDAZOLE (FLAGYL) IVPB 500 mg  Status:  Discontinued     500 mg 100 mL/hr over 60 Minutes Intravenous Every 8 hours 07/22/19 0734 07/23/19 0833       Objective: Vitals: Today's Vitals   08/02/19 0920 08/02/19 0930 08/02/19 0951 08/02/19 0954  BP: (!) 149/84 (!) 152/69  (!) 157/98  Pulse: 95 94  93  Resp: 14 16    Temp:    97.9 F (36.6 C)  TempSrc:    Oral  SpO2: 100% 96%  98%  Weight:      Height:      PainSc: Asleep 2  10-Worst pain ever     Intake/Output Summary (Last 24 hours) at 08/02/2019 1148 Last data filed at 08/02/2019 1000 Gross per 24 hour  Intake 1075 ml  Output 700 ml  Net 375 ml   Filed Weights   07/31/19 0500 08/02/19 0500 08/02/19 0801  Weight: (!) 155.5 kg (!) 157 kg (!) 157 kg   Weight change:    Intake/Output from previous day: 05/26 0701 - 05/27 0700 In: 916 [P.O.:601; I.V.:315] Out: 300 [Urine:300] Intake/Output this shift: Total I/O In: 400 [I.V.:400] Out: 400 [Urine:400]  Examination:  General exam: AAO x3,  obese, NAD, weak appearing. HEENT:Oral mucosa moist, Ear/Nose WNL grossly,dentition normal. Respiratory system: bilaterally clear,no wheezing or crackles,no use of accessory muscle, non tender. Cardiovascular system: S1 & S2 +, regular, No JVD. Gastrointestinal system: Abdomen soft, NT,ND, BS+. Nervous System:Alert, awake, moving extremities and grossly nonfocal Extremities: No edema, distal peripheral pulses palpable.  Skin: No rashes,no icterus. MSK: Normal muscle bulk,tone, power  Data Reviewed: I have personally reviewed following labs and imaging studies CBC: Recent Labs  Lab 07/27/19 0401 07/27/19 0401 07/28/19 0349 07/28/19 0349 07/29/19 0400 07/30/19 1105 07/31/19 0402 08/01/19 0130 08/02/19 0352  WBC 12.0*   < > 10.8*   < > 9.6 9.9 10.9* 11.3* 11.1*  NEUTROABS 8.2*  --  7.1  --  5.9  --   --  8.0*  7.9*  HGB 7.6*  7.7*   < > 7.5*   < > 7.3* 8.1* 7.3* 8.1* 8.1*  HCT 25.4*  25.4*   < > 25.0*   < > 24.9* 27.0* 24.3* 26.6* 26.1*  MCV 88.8   < > 89.9   < > 91.9 90.6 92.0 89.9 90.0  PLT 365   < > 376   < > 379 413* 383 372 389   < > = values in this interval not displayed.   Basic Metabolic Panel: Recent Labs  Lab 07/29/19 0400 07/30/19 1105 07/31/19 0402 08/01/19 0130 08/02/19 0352  NA 139 137 137 136 138  K 3.9 3.5 3.3* 3.7 3.7  CL 108 105 106 104 103  CO2 22 24 25 24 24   GLUCOSE 234* 148* 189* 247* 183*  BUN 23* 15 19 25* 24*  CREATININE 1.07* 1.03* 1.46* 1.27* 1.05*  CALCIUM 8.6* 8.5* 8.8* 8.7* 8.6*  MG  --  1.6* 1.8  --   --   PHOS  --  3.2  --   --   --    GFR: Estimated Creatinine Clearance: 94 mL/min (A) (by C-G formula based on SCr of 1.05 mg/dL (H)). Liver Function Tests: Recent Labs  Lab 07/27/19 0401 08/01/19 0130 08/02/19 0352  AST 10* 9* 10*  ALT 9 9 8   ALKPHOS 84 92 87  BILITOT 0.8 0.4 0.7  PROT 6.1* 6.1* 6.2*  ALBUMIN 2.8* 2.7* 2.7*   No results for input(s): LIPASE, AMYLASE in the last 168 hours. No results for input(s): AMMONIA  in the last 168 hours. Coagulation Profile: No results for input(s): INR, PROTIME in the last 168 hours. Cardiac Enzymes: No results for input(s): CKTOTAL, CKMB, CKMBINDEX, TROPONINI in the last 168 hours. BNP (last 3 results) No results for input(s): PROBNP in the last 8760 hours. HbA1C: No results for input(s): HGBA1C in the last 72 hours. CBG: Recent Labs  Lab 08/01/19 1800 08/01/19 1931 08/01/19 2337 08/02/19 0547 08/02/19 0948  GLUCAP 256* 255* 248* 184* 177*   Lipid Profile: No results for input(s): CHOL, HDL, LDLCALC, TRIG, CHOLHDL, LDLDIRECT in the last 72 hours. Thyroid Function Tests: No results for input(s): TSH, T4TOTAL, FREET4, T3FREE, THYROIDAB in the last 72 hours. Anemia Panel: No results for input(s): VITAMINB12, FOLATE, FERRITIN, TIBC, IRON, RETICCTPCT in the last 72 hours. Sepsis Labs: No results for input(s): PROCALCITON, LATICACIDVEN in the last 168 hours.  No results found for this or any previous visit (from the past 240 hour(s)).    Radiology Studies: No results found.   LOS: 11 days   Antonieta Pert, MD [facility]  08/02/2019, 11:48 AM

## 2019-08-02 NOTE — Progress Notes (Signed)
Patient refused midline dressing change at this time, per patient she is in pain. RN made aware.

## 2019-08-02 NOTE — Anesthesia Preprocedure Evaluation (Signed)
Anesthesia Evaluation  Patient identified by MRN, date of birth, ID band Patient awake    Reviewed: Allergy & Precautions, NPO status , Patient's Chart, lab work & pertinent test results, reviewed documented beta blocker date and time   History of Anesthesia Complications Negative for: history of anesthetic complications  Airway Mallampati: II  TM Distance: >3 FB Neck ROM: Full    Dental  (+) Edentulous Upper, Dental Advisory Given, Missing   Pulmonary shortness of breath, COPD,  COPD inhaler,  07/22/2019 SARS coronavirus NEG   breath sounds clear to auscultation       Cardiovascular hypertension, Pt. on medications and Pt. on home beta blockers (-) angina+CHF  + dysrhythmias Atrial Fibrillation  Rhythm:Regular Rate:Normal  07/23/2019 ECHO: EF 45-50%, valves OK 1. Left ventricular ejection fraction, by estimation, is 45 to 50%. The left ventricle has mildly decreased function. The left ventricle demonstrates global hypokinesis. Left ventricular diastolic function could not be evaluated.  2. Right ventricular systolic function is normal. The right ventricular size is normal. There is moderately elevated pulmonary artery systolic pressure.  3. The mitral valve is normal in structure. Trivial mitral valve regurgitation. No evidence of mitral stenosis.  4. The aortic valve is tricuspid. Aortic valve regurgitation is not visualized. Mild aortic valve sclerosis is present, with no evidence of aortic valve stenosis.  5. The inferior vena cava is normal in size with greater than 50% respiratory variability, suggesting right atrial pressure of 3 mmHg.    Neuro/Psych PSYCHIATRIC DISORDERS Anxiety Depression CVA (R sided weakness, uses wheelchair), Residual Symptoms    GI/Hepatic Neg liver ROS, PUD, GERD  Medicated and Controlled,Gastroparesis    Endo/Other  diabetes, Insulin DependentMorbid obesityBMI 55  Renal/GU Renal  InsufficiencyRenal disease (creat 1.34)CKD 2  negative genitourinary   Musculoskeletal  (+) Fibromyalgia -Sciatica    Abdominal (+) + obese,   Peds  Hematology  (+) Blood dyscrasia (Hb 7.7), anemia , eliquis hct 24.9   Anesthesia Other Findings   Reproductive/Obstetrics negative OB ROS                             Anesthesia Physical  Anesthesia Plan  ASA: III  Anesthesia Plan: MAC   Post-op Pain Management:    Induction:   PONV Risk Score and Plan: 2 and Propofol infusion and TIVA  Airway Management Planned: Natural Airway and Simple Face Mask  Additional Equipment: None  Intra-op Plan:   Post-operative Plan:   Informed Consent: I have reviewed the patients History and Physical, chart, labs and discussed the procedure including the risks, benefits and alternatives for the proposed anesthesia with the patient or authorized representative who has indicated his/her understanding and acceptance.       Plan Discussed with: CRNA  Anesthesia Plan Comments:         Anesthesia Quick Evaluation

## 2019-08-02 NOTE — Interval H&P Note (Signed)
History and Physical Interval Note:  08/02/2019 8:17 AM  Deborah [name]  has presented today for surgery, with the diagnosis of Duodenal carcinoid tumor.  The various methods of treatment have been discussed with the patient and family. After consideration of risks, benefits and other options for treatment, the patient has consented to  Procedure(s): ESOPHAGOGASTRODUODENOSCOPY (EGD) WITH PROPOFOL (N/A) as a surgical intervention.  The patient's history has been reviewed, patient examined, no change in status, stable for surgery.  I have reviewed the patient's chart and labs.  Questions were answered to the patient's satisfaction.     [name]

## 2019-08-03 ENCOUNTER — Inpatient Hospital Stay (HOSPITAL_COMMUNITY): Payer: Medicare Other

## 2019-08-03 LAB — COMPREHENSIVE METABOLIC PANEL
ALT: 10 U/L (ref 0–44)
AST: 10 U/L — ABNORMAL LOW (ref 15–41)
Albumin: 2.7 g/dL — ABNORMAL LOW (ref 3.5–5.0)
Alkaline Phosphatase: 92 U/L (ref 38–126)
Anion gap: 8 (ref 5–15)
BUN: 21 mg/dL — ABNORMAL HIGH (ref 6–20)
CO2: 24 mmol/L (ref 22–32)
Calcium: 8.5 mg/dL — ABNORMAL LOW (ref 8.9–10.3)
Chloride: 107 mmol/L (ref 98–111)
Creatinine, Ser: 1.11 mg/dL — ABNORMAL HIGH (ref 0.44–1.00)
GFR calc Af Amer: >60 mL/min (ref >60)
GFR calc non Af Amer: 56 mL/min — ABNORMAL LOW (ref >60)
Glucose, Bld: 220 mg/dL — ABNORMAL HIGH (ref 70–99)
Potassium: 3.9 mmol/L (ref 3.5–5.1)
Sodium: 139 mmol/L (ref 135–145)
Total Bilirubin: 0.7 mg/dL (ref 0.3–1.2)
Total Protein: 6.1 g/dL — ABNORMAL LOW (ref 6.5–8.1)

## 2019-08-03 LAB — CBC WITH DIFFERENTIAL/PLATELET
Abs Immature Granulocytes: 0.08 10*3/uL — ABNORMAL HIGH (ref 0.00–0.07)
Basophils Absolute: 0.1 10*3/uL (ref 0.0–0.1)
Basophils Relative: 0 %
Eosinophils Absolute: 0.3 10*3/uL (ref 0.0–0.5)
Eosinophils Relative: 2 %
HCT: 26.2 % — ABNORMAL LOW (ref 36.0–46.0)
Hemoglobin: 8 g/dL — ABNORMAL LOW (ref 12.0–15.0)
Immature Granulocytes: 1 %
Lymphocytes Relative: 10 %
Lymphs Abs: 1.4 10*3/uL (ref 0.7–4.0)
MCH: 27.5 pg (ref 26.0–34.0)
MCHC: 30.5 g/dL (ref 30.0–36.0)
MCV: 90 fL (ref 80.0–100.0)
Monocytes Absolute: 0.6 10*3/uL (ref 0.1–1.0)
Monocytes Relative: 5 %
Neutro Abs: 11.1 10*3/uL — ABNORMAL HIGH (ref 1.7–7.7)
Neutrophils Relative %: 82 %
Platelets: 376 10*3/uL (ref 150–400)
RBC: 2.91 MIL/uL — ABNORMAL LOW (ref 3.87–5.11)
RDW: 15.7 % — ABNORMAL HIGH (ref 11.5–15.5)
WBC: 13.5 10*3/uL — ABNORMAL HIGH (ref 4.0–10.5)
nRBC: 0 % (ref 0.0–0.2)

## 2019-08-03 LAB — GLUCOSE, CAPILLARY
Glucose-Capillary: 207 mg/dL — ABNORMAL HIGH (ref 70–99)
Glucose-Capillary: 207 mg/dL — ABNORMAL HIGH (ref 70–99)
Glucose-Capillary: 221 mg/dL — ABNORMAL HIGH (ref 70–99)
Glucose-Capillary: 224 mg/dL — ABNORMAL HIGH (ref 70–99)
Glucose-Capillary: 202 mg/dL — ABNORMAL HIGH (ref 70–99)
Glucose-Capillary: 187 mg/dL — ABNORMAL HIGH (ref 70–99)

## 2019-08-03 MED ORDER — METRONIDAZOLE IN NACL 5-0.79 MG/ML-% IV SOLN
500.0000 mg | Freq: Three times a day (TID) | INTRAVENOUS | Status: DC
  Administered 2019-08-03 – 2019-08-04 (×4): 500 mg via INTRAVENOUS
  Filled 2019-08-03 (×4): qty 100

## 2019-08-03 MED ORDER — PROMETHAZINE HCL 25 MG/ML IJ SOLN
25.0000 mg | Freq: Four times a day (QID) | INTRAMUSCULAR | Status: DC | PRN
  Administered 2019-08-03: 25 mg via INTRAVENOUS
  Filled 2019-08-03 (×2): qty 1

## 2019-08-03 MED ORDER — MORPHINE SULFATE (PF) 4 MG/ML IV SOLN
4.0000 mg | INTRAVENOUS | Status: DC | PRN
  Administered 2019-08-03 (×2): 4 mg via INTRAVENOUS
  Administered 2019-08-03: 2 mg via INTRAVENOUS
  Administered 2019-08-04 (×2): 4 mg via INTRAVENOUS
  Filled 2019-08-03 (×5): qty 1

## 2019-08-03 MED ORDER — PANTOPRAZOLE SODIUM 40 MG IV SOLR
40.0000 mg | Freq: Two times a day (BID) | INTRAVENOUS | Status: DC
  Administered 2019-08-03 – 2019-08-08 (×12): 40 mg via INTRAVENOUS
  Filled 2019-08-03 (×13): qty 40

## 2019-08-03 MED ORDER — SODIUM CHLORIDE 0.9 % IV SOLN
2.0000 g | INTRAVENOUS | Status: DC
  Administered 2019-08-03 – 2019-08-04 (×2): 2 g via INTRAVENOUS
  Filled 2019-08-03 (×2): qty 2

## 2019-08-03 NOTE — Progress Notes (Signed)
Informed patient of CT results (no signs of perforation, no acute findings).  Patient's mother at bedside.  Patient continues to endorses severe pain "all over my body" (not limited to her abdomen).  Abdominal exam is benign.  Abdomen is soft and nontender without guarding or peritoneal signs.  Patient is tachypneic and appears upset.  Advised patient that persistent/daily use of narcotics worsen gastroparesis and may be contributing to her abdominal pain/nausea/vomiting.  Patient became upset when I told her this, requested more narcotics, began screaming and crying, and stated she wants "something stronger than morphine" for her pain, as the morphine did not help.  Hospital team made aware and pain medication management deferred to hospital team.  Salley Slaughter, [facility] 08/03/2019  Contact # [phone]

## 2019-08-03 NOTE — Progress Notes (Signed)
Telemetry notified RN that patient had 5-beat run of V-tach.  RN text paged MD.

## 2019-08-03 NOTE — Progress Notes (Signed)
PROGRESS NOTE    Deborah [name]  SWH:[id number] DOB: [birth year] DOA: 07/21/2019 PCP: [name], DO   Chef Complaints: nausea/vomitting  Brief Narrative: 54 year old female with history of depression, nonhemorrhagic CVA, chronic systolic CHF PAF on Eliquis, acute distress 3 8, hypertension, gout, GERD/gastric ulcer, anemia of chronic disease T2DM with recurrent admission for DKA/diabetic gastroparesis presented with nausea vomiting diffuse abdominal pain.  Recently was admitted for gastroparesis earlier this month GI.  Patient on presentation had tachycardia tachypnea and lactic acidosis was admitted, seen by GI.  Underwent GES which was abnormal then EGD to rule out gastric outlet obstruction but no acute finding, underwent colonoscopy subsequently that showed 2 to 3 mm polyp in the descending colon which was biopsied,. Biopsy findings showed well-differentiated neuroendocrine tumor  Patient underwent EGD with polypectomy 5/27.    Subjective: Complains of nausea vomiting overnight and ongoing abdominal pain. No fever or chills. Yesterday evening patient was adamant on going on regular diet from clear liquid WBC count went up 13.5 but afebrile. Assessment & Plan:  Intractable nausea and vomiting and abdominal pain/diabetic gastroparesis: Patient is doing well but overnight having intractable nausea vomiting.  Had endoscopy with duodenal polypectomy 5/27.  Discussed with GI she is back to clear liquid diet, continue on nausea medication Zofran/Phenergan, erythromycin, PPI twice daily.  Checking x-ray abdomen to rule out postop complication and is starting antibiotics ceftriaxone per GI.  Patient had extensive work-up with GES, EGD 5/21 and colonoscopy.   Newly diagnosed well-differentiated neuroendocrine tumor and adrenal area, underwent a repeat EGD 5/27 with removal of the polyp.continue treatment as #1.  She had Endo Clip placed too.   Diabetes mellitus, uncontrolled, with  diabetic neuropathy, on long-term insulin poorly controlled A1c at 8.7: Early control, continue Lantus 35 units, and NovoLog 5 units 3 times daily 11 sliding scale.  Once able to tolerate well can increase further.Diabetic coordinator following. Recent Labs  Lab 08/02/19 1629 08/02/19 2118 08/03/19 0238 08/03/19 0411 08/03/19 0734  GLUCAP 206* 215* 207* 207* 221*   AKI on CKD (chronic kidney disease), stage IIIa: Felt to be multifactorial.  Diuretics on hold.  Avoid nephrotoxin monitor renal function.  Encourage oral hydration.  Creatinine stable. Recent Labs  Lab 07/30/19 1105 07/31/19 0402 08/01/19 0130 08/02/19 0352 08/03/19 0322  BUN 15 19 25* 24* 21*  CREATININE 1.03* 1.46* 1.27* 1.05* 1.11*   Anemia of chronic disease/chronic microcytic anemia iron deficiency anemia with acute blood loss: Status post 1 unit PRBC. Hb is stable overall.  Monitor. Recent Labs  Lab 07/30/19 1105 07/31/19 0402 08/01/19 0130 08/02/19 0352 08/03/19 0322  HGB 8.1* 7.3* 8.1* 8.1* 8.0*  HCT 27.0* 24.3* 26.6* 26.1* 26.2*   PAF/essential hypertension : Rate is controlled, blood pressure is stable continue metoprolol, Imdur and as needed hydralazine.  Per GI okay to resume Eliquis 5/29 if hemoglobin remains stable and no bleeding .  Morbid obesity with BMI 55: Will benefit with weight loss PCP follow-up lifestyle change  Chronic systolic CHF: Monitor closely remains euvolemic.  Noted somewhat gaining weight .  Not tolerating p.o diet today- so hold diuretics,resume tomorrow . Wt Readings from Last 3 Encounters:  08/03/19 (!) 156.7 kg  07/19/19 (!) 151.1 kg  07/04/19 (!) 156.1 kg   Drug-seeking behavior-limit opiates  GERD continue PPI  Hemorrhagic CVA continue Lipitor, LDL 37, resume Eliquis once okay with GI after the procedure  Chronic anxiety/depression continue Cymbalta  Tardive dyskinesia history with involuntary movements of her tongue and mouth-avoid Reglan was  on Reglan in the  past.  Hypomagnesia Improved  DVT prophylaxis:SCD while not able to take Eliquis. Code Status:full Family Communication: plan of care discussed with patient at bedside.  Status is: Inpatient Remains inpatient appropriate because of ongoing nausea vomiting, difficulty to tolerate p.o.    Dispo: The patient is from: Home              Anticipated d/c is to: Home              Anticipated d/c date is 1 day              Patient currently is not medically stable to d/c.  Diet Order            Diet clear liquid Room service appropriate? Yes; Fluid consistency: Thin  Diet effective now               Body mass index is 55.76 kg/m.  Consultants: GI Procedures: EGD, colonoscopy.   EGD 5/27  IMPRESSION: Z-line regular, 36 cm from the incisors.                           - Normal mucosa was found in the entire stomach.                           - Non-bleeding duodenal ulcer with no stigmata of                            bleeding.                           - A single duodenal polyp. Resected and retrieved.                            Clip was placed.                           - Normal second portion of the duodenum. Moderate Sedation:      Moderate (conscious) sedation was personally administered by an       anesthesia professional. The following parameters were monitored: oxygen       saturation, heart rate, blood pressure, and response to care. Recommendation:           - Return patient to hospital ward for ongoing care.                           - Full liquid diet.                           - Continue present medications.                           - Resume Eliquis (apixaban) at prior dose in 2 days.                           - Await pathology results.                           - Repeat upper endoscopy in 3 months to evaluate  the response to therapy. Microbiology:see note  Medications: Scheduled Meds: . atorvastatin  10 mg Oral Daily  . DULoxetine   40 mg Oral Daily  . erythromycin  250 mg Oral TID WC & HS  . ferrous sulfate  325 mg Oral Q breakfast  . hydrALAZINE  5 mg Intravenous Q6H  . insulin aspart  0-20 Units Subcutaneous TID WC  . insulin aspart  0-5 Units Subcutaneous QHS  . insulin aspart  5 Units Subcutaneous TID WC  . insulin glargine  35 Units Subcutaneous Daily  . isosorbide mononitrate  60 mg Oral Daily  . metoprolol succinate  50 mg Oral Daily  . pantoprazole (PROTONIX) IV  40 mg Intravenous Q12H   Continuous Infusions: . cefTRIAXone (ROCEPHIN)  IV    . metronidazole      Antimicrobials: Anti-infectives (From admission, onward)   Start     Dose/Rate Route Frequency Ordered Stop   08/03/19 1000  cefTRIAXone (ROCEPHIN) 2 g in sodium chloride 0.9 % 100 mL IVPB     2 g 200 mL/hr over 30 Minutes Intravenous Every 24 hours 08/03/19 0900     08/03/19 1000  metroNIDAZOLE (FLAGYL) IVPB 500 mg     500 mg 100 mL/hr over 60 Minutes Intravenous Every 8 hours 08/03/19 0900     07/26/19 1700  erythromycin (E-MYCIN) tablet 250 mg     250 mg Oral 3 times daily with meals & bedtime 07/26/19 1312     07/24/19 1700  erythromycin (E-MYCIN) tablet 250 mg  Status:  Discontinued     250 mg Oral 3 times daily with meals & bedtime 07/24/19 1205 07/25/19 0838   07/22/19 0800  erythromycin (E-MYCIN) tablet 250 mg  Status:  Discontinued     250 mg Oral 3 times daily with meals & bedtime 07/22/19 0723 07/23/19 1507   07/22/19 0800  ciprofloxacin (CIPRO) IVPB 400 mg  Status:  Discontinued     400 mg 200 mL/hr over 60 Minutes Intravenous Every 12 hours 07/22/19 0734 07/23/19 0833   07/22/19 0800  metroNIDAZOLE (FLAGYL) IVPB 500 mg  Status:  Discontinued     500 mg 100 mL/hr over 60 Minutes Intravenous Every 8 hours 07/22/19 0734 07/23/19 0833       Objective: Vitals: Today's Vitals   08/03/19 0058 08/03/19 0446 08/03/19 0511 08/03/19 0541  BP: (!) 157/78  (!) 159/86   Pulse: (!) 106  (!) 108   Resp: 18  18   Temp: 98.3 F (36.8  C)  98.2 F (36.8 C)   TempSrc: Oral  Oral   SpO2: 97%  99%   Weight:    (!) 156.7 kg  Height:      PainSc:  10-Worst pain ever      Intake/Output Summary (Last 24 hours) at 08/03/2019 1048 Last data filed at 08/03/2019 0640 Gross per 24 hour  Intake 821.67 ml  Output --  Net 821.67 ml   Filed Weights   08/02/19 0500 08/02/19 0801 08/03/19 0541  Weight: (!) 157 kg (!) 157 kg (!) 156.7 kg   Weight change: 0 kg   Intake/Output from previous day: 05/27 0701 - 05/28 0700 In: 1221.7 [P.O.:500; I.V.:721.7] Out: 400 [Urine:400] Intake/Output this shift: No intake/output data recorded.  Examination: General exam: AAOx3, anxious, and nauseous in mild to moderate distress. HEENT:Oral mucosa moist, Ear/Nose WNL grossly, dentition normal. Respiratory system: bilaterally clear,no wheezing or crackles,no use of accessory muscle Cardiovascular system: S1 & S2 +, No JVD,. Gastrointestinal system: Abdomen soft, central  abdomen tender but with obese abdomen,ND, BS+ Nervous System:Alert, awake, moving extremities and grossly nonfocal Extremities: No edema, distal peripheral pulses palpable.  Skin: No rashes,no icterus. MSK: Normal muscle bulk,tone, power  Data Reviewed: I have personally reviewed following labs and imaging studies CBC: Recent Labs  Lab 07/28/19 0349 07/28/19 0349 07/29/19 0400 07/29/19 0400 07/30/19 1105 07/31/19 0402 08/01/19 0130 08/02/19 0352 08/03/19 0322  WBC 10.8*   < > 9.6   < > 9.9 10.9* 11.3* 11.1* 13.5*  NEUTROABS 7.1  --  5.9  --   --   --  8.0* 7.9* 11.1*  HGB 7.5*   < > 7.3*   < > 8.1* 7.3* 8.1* 8.1* 8.0*  HCT 25.0*   < > 24.9*   < > 27.0* 24.3* 26.6* 26.1* 26.2*  MCV 89.9   < > 91.9   < > 90.6 92.0 89.9 90.0 90.0  PLT 376   < > 379   < > 413* 383 372 389 376   < > = values in this interval not displayed.   Basic Metabolic Panel: Recent Labs  Lab 07/30/19 1105 07/31/19 0402 08/01/19 0130 08/02/19 0352 08/03/19 0322  NA 137 137 136 138 139   K 3.5 3.3* 3.7 3.7 3.9  CL 105 106 104 103 107  CO2 24 25 24 24 24   GLUCOSE 148* 189* 247* 183* 220*  BUN 15 19 25* 24* 21*  CREATININE 1.03* 1.46* 1.27* 1.05* 1.11*  CALCIUM 8.5* 8.8* 8.7* 8.6* 8.5*  MG 1.6* 1.8  --   --   --   PHOS 3.2  --   --   --   --    GFR: Estimated Creatinine Clearance: 88.9 mL/min (A) (by C-G formula based on SCr of 1.11 mg/dL (H)). Liver Function Tests: Recent Labs  Lab 08/01/19 0130 08/02/19 0352 08/03/19 0322  AST 9* 10* 10*  ALT 9 8 10   ALKPHOS 92 87 92  BILITOT 0.4 0.7 0.7  PROT 6.1* 6.2* 6.1*  ALBUMIN 2.7* 2.7* 2.7*   No results for input(s): LIPASE, AMYLASE in the last 168 hours. No results for input(s): AMMONIA in the last 168 hours. Coagulation Profile: No results for input(s): INR, PROTIME in the last 168 hours. Cardiac Enzymes: No results for input(s): CKTOTAL, CKMB, CKMBINDEX, TROPONINI in the last 168 hours. BNP (last 3 results) No results for input(s): PROBNP in the last 8760 hours. HbA1C: No results for input(s): HGBA1C in the last 72 hours. CBG: Recent Labs  Lab 08/02/19 1629 08/02/19 2118 08/03/19 0238 08/03/19 0411 08/03/19 0734  GLUCAP 206* 215* 207* 207* 221*   Lipid Profile: No results for input(s): CHOL, HDL, LDLCALC, TRIG, CHOLHDL, LDLDIRECT in the last 72 hours. Thyroid Function Tests: No results for input(s): TSH, T4TOTAL, FREET4, T3FREE, THYROIDAB in the last 72 hours. Anemia Panel: No results for input(s): VITAMINB12, FOLATE, FERRITIN, TIBC, IRON, RETICCTPCT in the last 72 hours. Sepsis Labs: No results for input(s): PROCALCITON, LATICACIDVEN in the last 168 hours.  No results found for this or any previous visit (from the past 240 hour(s)).    Radiology Studies: No results found.   LOS: 12 days   Antonieta Pert, MD [facility]  08/03/2019, 10:48 AM

## 2019-08-03 NOTE — Progress Notes (Signed)
   Inpatient Diabetes Program Recommendations  AACE/ADA: New Consensus Statement on Inpatient Glycemic Control (2015)  Target Ranges:  Prepandial:   less than 140 mg/dL      Peak postprandial:   less than 180 mg/dL (1-2 hours)      Critically ill patients:  140 - 180 mg/dL   Lab Results  Component Value Date   GLUCAP 221 (H) 08/03/2019   HGBA1C 8.7 (H) 07/22/2019    Review of Glycemic Control  Blood sugars continue above goal of < 180 mg/dL.  Inpatient Diabetes Program Recommendations:    Increase Lantus to 40 units QD Increase Novolog to 8 units tidwc for meal coverage insulin  Continue to follow. Have stressed importance of improved glycemic control to reduce risks of complications.  Thank you. Lorenda Peck, RD, LDN, CDE Inpatient Diabetes Coordinator [phone]

## 2019-08-03 NOTE — Progress Notes (Signed)
Patient continues to have worsening abdominal pain, nausea, and vomiting since last night.  Discussed with Dr. Alessandra Bevels, ordered STAT CT abdomen without contrast, made patient NPO.  On exam, patient is tachypneic and appears uncomfortable.  Abdomen remains soft.  Radiology called at 2:20PM and made aware of need for stat CT of abdomen.  Salley Slaughter, PA-C [facility] Gastroenterology 08/03/2019  Contact #  [phone]

## 2019-08-03 NOTE — Care Management Important Message (Signed)
Important Message  Patient Details IM Letter given to Dessa Phi RN Case Manager to present to the Patient Name: Deborah [name] MRN: [id number] Date of Birth: 09-23-1965   Medicare Important Message Given:  Yes     Kerin Salen 08/03/2019, 10:10 AM

## 2019-08-03 NOTE — Progress Notes (Signed)
-  Called and discussed with radiology department twice.  They went on the floor to transfer the patient but could not do it because of patient's ongoing nausea and vomiting.  According to radiology staff, RN will notify them when patient is stable enough for transfer down to radiology.  Based on radiology tech, bedside portable imaging would not be of adequate quality because of patient's body habitus.  -Awaiting callback from RN.  -If not able to get x-ray in timely manner, we will arrange for noncontrasted CT abdomen.  Otis Brace MD, [place] 08/03/2019, 11:09 AM  Contact #  [phone]

## 2019-08-03 NOTE — Progress Notes (Signed)
CT abdomen without contrast showed no bowel obstruction or active inflammation in the visualized abdomen. No evidence of perforation.  Metallic object visualized in the duodenal bulb is the clip placed post-polypectomy.  Full liquid diet ordered.  Continue to minimize narcotic usage, as this will contribute to abdominal pain and nausea/vomiting in the setting of gastroparesis.  Salley Slaughter, PA-C [facility] Gastroenterology 08/03/2019  Contact # [phone]

## 2019-08-03 NOTE — Progress Notes (Addendum)
[facility] Gastroenterology Progress Note  Deborah [name] 54 y.o. [birth year]  CC: Anemia, nausea vomiting, abdominal pain   Subjective: Patient seen and examined at bedside.  Underwent EGD with removal of duodenal bulb polyp with hot snare.  One clip was placed.  She is complaining of worsening nausea and vomiting overnight.  Also complaining of ongoing abdominal pain.  ROS : Denies any fever. Denies chest pain.   Objective: Vital signs in last 24 hours: Vitals:   08/03/19 0058 08/03/19 0511  BP: (!) 157/78 (!) 159/86  Pulse: (!) 106 (!) 108  Resp: 18 18  Temp: 98.3 F (36.8 C) 98.2 F (36.8 C)  SpO2: 97% 99%    Physical Exam:  General:  Alert, cooperative, no distress, appears stated age  Head:  Normocephalic, without obvious abnormality, atraumatic  Eyes:  , EOM's intact,   Lungs:    respirations unlabored     Abdomen:   Soft, central abdominal tenderness to palpation, obese abdomen, bowel sounds are present.  Do not appreciate any peritoneal signs  Extremities: Extremities normal, atraumatic, no  edema       Lab Results: Recent Labs    08/02/19 0352 08/03/19 0322  NA 138 139  K 3.7 3.9  CL 103 107  CO2 24 24  GLUCOSE 183* 220*  BUN 24* 21*  CREATININE 1.05* 1.11*  CALCIUM 8.6* 8.5*   Recent Labs    08/02/19 0352 08/03/19 0322  AST 10* 10*  ALT 8 10  ALKPHOS 87 92  BILITOT 0.7 0.7  PROT 6.2* 6.1*  ALBUMIN 2.7* 2.7*   Recent Labs    08/02/19 0352 08/03/19 0322  WBC 11.1* 13.5*  NEUTROABS 7.9* 11.1*  HGB 8.1* 8.0*  HCT 26.1* 26.2*  MCV 90.0 90.0  PLT 389 376   No results for input(s): LABPROT, INR in the last 72 hours.    Assessment/Plan: -Duodenal bulb polyp biopsy revealed carcinoid tumor.  Underwent repeat EGD yesterday with removal of duodenal polyp with hot snare.  Procedure was somewhat difficult because of the location as well as patient's body habitus.  One clip was placed. -Nausea and vomiting. -Abdominal pain.  Iron  deficiency anemia. Colonoscopy yesterday negative for active bleeding. Had a fair prep. 2 diminutive polyps removed with biopsy forcep. One 10 mm polyp removed with hot snare.  Pathology revealed tubular adenomas. -Gastroparesis. -History of atrial fibrillation. Eliquis on hold  -History of CHF  Recommendations --------------------------- -Get abdominal x-ray to rule out postoperative complication. -Start antibiotics given mild leukocytosis.  Patient is allergic to penicillin. -Change Protonix to IV twice daily -Change diet to clear liquid for now -If clinically improving, consider starting anticoagulation tomorrow -GI will follow   Otis Brace MD, FACP 08/03/2019, 8:54 AM  Contact #  [phone]

## 2019-08-04 LAB — CBC
HCT: 25.3 % — ABNORMAL LOW (ref 36.0–46.0)
Hemoglobin: 7.7 g/dL — ABNORMAL LOW (ref 12.0–15.0)
MCH: 27.3 pg (ref 26.0–34.0)
MCHC: 30.4 g/dL (ref 30.0–36.0)
MCV: 89.7 fL (ref 80.0–100.0)
Platelets: 367 10*3/uL (ref 150–400)
RBC: 2.82 MIL/uL — ABNORMAL LOW (ref 3.87–5.11)
RDW: 15.6 % — ABNORMAL HIGH (ref 11.5–15.5)
WBC: 12 10*3/uL — ABNORMAL HIGH (ref 4.0–10.5)
nRBC: 0 % (ref 0.0–0.2)

## 2019-08-04 LAB — BASIC METABOLIC PANEL
Anion gap: 9 (ref 5–15)
BUN: 18 mg/dL (ref 6–20)
CO2: 25 mmol/L (ref 22–32)
Calcium: 8.2 mg/dL — ABNORMAL LOW (ref 8.9–10.3)
Chloride: 103 mmol/L (ref 98–111)
Creatinine, Ser: 1.25 mg/dL — ABNORMAL HIGH (ref 0.44–1.00)
GFR calc Af Amer: 56 mL/min — ABNORMAL LOW (ref >60)
GFR calc non Af Amer: 48 mL/min — ABNORMAL LOW (ref >60)
Glucose, Bld: 135 mg/dL — ABNORMAL HIGH (ref 70–99)
Potassium: 3 mmol/L — ABNORMAL LOW (ref 3.5–5.1)
Sodium: 137 mmol/L (ref 135–145)

## 2019-08-04 LAB — GLUCOSE, CAPILLARY
Glucose-Capillary: 136 mg/dL — ABNORMAL HIGH (ref 70–99)
Glucose-Capillary: 107 mg/dL — ABNORMAL HIGH (ref 70–99)
Glucose-Capillary: 243 mg/dL — ABNORMAL HIGH (ref 70–99)
Glucose-Capillary: 238 mg/dL — ABNORMAL HIGH (ref 70–99)

## 2019-08-04 MED ORDER — APIXABAN 5 MG PO TABS
5.0000 mg | ORAL_TABLET | Freq: Two times a day (BID) | ORAL | Status: DC
  Administered 2019-08-04 – 2019-08-09 (×10): 5 mg via ORAL
  Filled 2019-08-04 (×11): qty 1

## 2019-08-04 MED ORDER — HYDROMORPHONE HCL 1 MG/ML IJ SOLN
1.0000 mg | INTRAMUSCULAR | Status: DC | PRN
  Administered 2019-08-04 – 2019-08-08 (×18): 1 mg via INTRAVENOUS
  Filled 2019-08-04 (×18): qty 1

## 2019-08-04 NOTE — Progress Notes (Signed)
ANTICOAGULATION CONSULT NOTE - Initial Consult  Pharmacy Consult for Eliquis Indication: atrial fibrillation  Allergies  Allergen Reactions  . Contrast Media [Iodinated Diagnostic Agents] Anaphylaxis    Cardiac arrest  . Diazepam [name]  . Isovue [Iopamidol] Anaphylaxis    Patient had seizure like activity and then code post 100 cc of isovue 300  . Lisinopril Anaphylaxis    Tongue and mouth swelling  . Penicillins Palpitations    Has patient had a PCN reaction causing immediate rash, facial/tongue/throat swelling, SOB or lightheadedness with hypotension: Yes, heart races Has patient had a PCN reaction causing severe rash involving mucus membranes or skin necrosis: No Has patient had a PCN reaction that required hospitalization: Yes  Has patient had a PCN reaction occurring within the last 10 years: No   . Acetaminophen Nausea Only and Other (See Comments)    Irritates stomach ulcer  Abdominal pain  . Tolmetin Nausea Only and Other (See Comments)    ULCER  . Aspirin Other (See Comments)    Irritates stomach ulcer   . Nsaids Other (See Comments)    ULCER  . Tramadol Nausea And Vomiting    Patient Measurements: Height: 5\' 6"  (167.6 cm) Weight: (!) 156.7 kg (345 lb 7.4 oz) IBW/kg (Calculated) : 59.3  Vital Signs: Temp: 97.9 F (36.6 C) (05/29 1329) Temp Source: Oral (05/29 1329) BP: 123/75 (05/29 1329) Pulse Rate: 85 (05/29 1329)  Labs: Recent Labs    08/02/19 0352 08/02/19 0352 08/03/19 0322 08/04/19 0443  HGB 8.1*   < > 8.0* 7.7*  HCT 26.1*  --  26.2* 25.3*  PLT 389  --  376 367  CREATININE 1.05*  --  1.11* 1.25*   < > = values in this interval not displayed.    Estimated Creatinine Clearance: 78.9 mL/min (A) (by C-G formula based on SCr of 1.25 mg/dL (H)).   Medical History: Past Medical History:  Diagnosis Date  . Acute back pain with sciatica, left   . Acute back pain with sciatica, right   . AKI (acute kidney injury) ([place])   . Anemia,  unspecified   . Chronic pain   . Chronic systolic CHF (congestive heart failure) ([place])   . Diabetes mellitus   . Esophageal reflux   . Fibromyalgia   . Gastric ulcer   . Gastroparesis   . Gout   . Hyperlipidemia   . Hypertension   . Lumbosacral stenosis   . NICM (nonischemic cardiomyopathy) ([place])   . Obesity   . PAF (paroxysmal atrial fibrillation) ([place])   . Stroke ([place]) 02/2011  . Vitamin B12 deficiency anemia     Medications:  Scheduled:  . atorvastatin  10 mg Oral Daily  . DULoxetine  40 mg Oral Daily  . erythromycin  250 mg Oral TID WC & HS  . ferrous sulfate  325 mg Oral Q breakfast  . hydrALAZINE  5 mg Intravenous Q6H  . insulin aspart  0-20 Units Subcutaneous TID WC  . insulin aspart  0-5 Units Subcutaneous QHS  . insulin aspart  5 Units Subcutaneous TID WC  . insulin glargine  35 Units Subcutaneous Daily  . isosorbide mononitrate  60 mg Oral Daily  . metoprolol succinate  50 mg Oral Daily  . pantoprazole (PROTONIX) IV  40 mg Intravenous Q12H   Infusions:   PRN: albuterol, alum & mag hydroxide-simeth, dextrose, hydrALAZINE, HYDROmorphone (DILAUDID) injection, LORazepam, metoprolol tartrate, nitroGLYCERIN, ondansetron **OR** ondansetron (ZOFRAN) IV, polyethylene glycol, promethazine, sodium chloride flush  Assessment:  54 yo female on chronic Eliquis for hx afib/CVA, admitted for work up of diabetic gastroparesis.  Eliquis has been on hold for GI procedures and low Hgb, s/p transfusions 5/20 and 5/25. Per GI okay to resume Eliquis 5/29 if hemoglobin remains stable and no bleeding .  Goal of Therapy:  Therapeutic anticoagulation   Plan:   Resume home dose of Eliquis 5mg  PO BID  Monitor closely for signs/symptoms of bleeding  Peggyann Juba, PharmD, BCPS Pharmacy: [phone] 08/04/2019,1:47 PM

## 2019-08-04 NOTE — Progress Notes (Signed)
Patient ID: Deborah [name], female   DOB: 1965/08/01, 54 y.o.   MRN: [id number]  PROGRESS NOTE    NANNETTE ZILL  WNI:[id number] DOB: 31-May-1965 DOA: 07/21/2019 PCP: [name], DO    Brief Narrative:  54 year old female with history of depression, nonhemorrhagic CVA, chronic systolic CHF PAF on Eliquis, acute distress 3 8, hypertension, gout, GERD/gastric ulcer, anemia of chronic disease T2DM with recurrent admission for DKA/diabetic gastroparesis presented with nausea vomiting diffuse abdominal pain.  Recently was admitted for gastroparesis earlier this month GI.  Patient on presentation had tachycardia tachypnea and lactic acidosis was admitted, seen by GI.  Underwent GES which was abnormal then EGD to rule out gastric outlet obstruction but no acute finding, underwent colonoscopy subsequently that showed 2 to 3 mm polyp in the descending colon which was biopsied,. Biopsy findings showed well-differentiated neuroendocrine tumor  Patient underwent EGD with polypectomy 5/27.    Assessment & Plan:   Active Problems:   Essential hypertension, benign   Uncontrolled type 2 diabetes mellitus with diabetic neuropathy, with long-term current use of insulin (HCC)   CKD (chronic kidney disease), stage II   Diabetic gastroparesis (HCC)   Morbid obesity (HCC)   Anxiety   Intractable nausea and vomiting   Chronic systolic CHF (congestive heart failure) ([place])   Uncontrolled type 2 diabetes mellitus with hyperglycemia ([place])   Hyperosmolar hyperglycemic state (HHS) ([place])   Drug-seeking behavior  Abdominal pain/gastroparesis:Exacerbation of gastroparesis islikely secondary to her uncontrolled diabetes. She has been admitted multiple times for the same problem. On erythromycin 3 times daily. She has history of noncompliance. GI consulted and she underwent gastric emptying test, EGD and colonoscopy. Discussed dietary changes. On Zofran/Phenergan, erythromycin, PPI twice daily.She had  Endo Clip placed too. GI has signed off. Begin soft diet today  Neuroendocrine tumor: Newly diagnosed well-differentiated neuroendocrine tumor and adrenal area, underwent a repeat EGD 5/27 with removal of the polyp.continue treatment as #1.    Type 2 diabetes mellitus: Recent hemoglobin A1c of 8.4. Continue Lantus 35 units Novolog 5 u tid and sliding scale. Diabetic coordinator has been following.   AKI on CKD stage IIIa:Baseline creatinine 1.3. Creatinine creeped up to the level of 2.1. Improved with IV fluids--down to 1.07 this am.Torsemide has been on hold due to AKI.  Suspected sepsis: Sepsis ruled out. Antibiotics discontinued. She was tachypneic, tachycardic with lactic acidosis on presentation. Elevated white cell count on presentation. Lactate is normalized. Procalcitonin negative.  Chronic systolic CHF: Continue input/output monitoring, daily weight. Euvolemic Currently on Toprol, hydralazine. Echo showed ejection fraction 45 to 50%, global hypokinesis, moderately elevated pulmonary artery systolic pressure. The findings are similar to the previous echo on 11/20.  Hypertension: Currently blood pressure stable.Continue current medications  Paroxysmal A. fib:On Eliquis for anticoagulation resumed today. On Toprol for rate control.  Hypertension: BP is well controlled on Imdur and prn hydalazine  History of nonhemorrhagic CVA: Continue current medications. Lipitor LDL 37, resume Eliquis  GERD: continue PPI  Chronic anxiety/depression: continue Cymbalta  Hyperlipidemia:On statin at home.  Morbid obesity: BMI 51.6.  Hypokalemia:Supplemented with potassium  Hypomagnesemia: Improved  Normocytic anemia: Hemoglobindropped to7.1 at one point .Iron level low normal. No evidence of acute blood loss. Could be associated with her CKD. Transfused with aunit of PRBC during this hospitalization. Hemoglobin in the range of 7.  Tardive dyskinesia: Has  involuntary movement of her tongue and mouth consistent with tardive dyskinesia. Could be associated with Reglan that she was using in the past.  DVT prophylaxis: XT:GGYIRSW Code Status: Full code  Family Communication: discussed with patient Disposition Plan: Home hopefully tomorrow   Consultants:   GI  Procedures:  Gastric emptying study  EGD 5/21, 5/27  colonoscopy  Antimicrobials: Anti-infectives (From admission, onward)   Start     Dose/Rate Route Frequency Ordered Stop   08/03/19 1000  cefTRIAXone (ROCEPHIN) 2 g in sodium chloride 0.9 % 100 mL IVPB     2 g 200 mL/hr over 30 Minutes Intravenous Every 24 hours 08/03/19 0900     08/03/19 1000  metroNIDAZOLE (FLAGYL) IVPB 500 mg     500 mg 100 mL/hr over 60 Minutes Intravenous Every 8 hours 08/03/19 0900     07/26/19 1700  erythromycin (E-MYCIN) tablet 250 mg     250 mg Oral 3 times daily with meals & bedtime 07/26/19 1312     07/24/19 1700  erythromycin (E-MYCIN) tablet 250 mg  Status:  Discontinued     250 mg Oral 3 times daily with meals & bedtime 07/24/19 1205 07/25/19 0838   07/22/19 0800  erythromycin (E-MYCIN) tablet 250 mg  Status:  Discontinued     250 mg Oral 3 times daily with meals & bedtime 07/22/19 0723 07/23/19 1507   07/22/19 0800  ciprofloxacin (CIPRO) IVPB 400 mg  Status:  Discontinued     400 mg 200 mL/hr over 60 Minutes Intravenous Every 12 hours 07/22/19 0734 07/23/19 0833   07/22/19 0800  metroNIDAZOLE (FLAGYL) IVPB 500 mg  Status:  Discontinued     500 mg 100 mL/hr over 60 Minutes Intravenous Every 8 hours 07/22/19 0734 07/23/19 0833       Subjective: Her pain is improved. Thinks that she ate too much following EGD and had worsening pain and N/V following this.  Objective: Vitals:   08/04/19 0530 08/04/19 0535 08/04/19 0820 08/04/19 1157  BP: 130/87 130/82 124/70 116/65  Pulse:  91 92   Resp:  18    Temp:  97.6 F (36.4 C)    TempSrc:  Oral    SpO2:  97%    Weight:      Height:         Intake/Output Summary (Last 24 hours) at 08/04/2019 1323 Last data filed at 08/04/2019 1000 Gross per 24 hour  Intake 700.12 ml  Output 1400 ml  Net -699.88 ml   Filed Weights   08/02/19 0500 08/02/19 0801 08/03/19 0541  Weight: (!) 157 kg (!) 157 kg (!) 156.7 kg    Examination: General exam: Appears calm and comfortable  Respiratory system: Clear to auscultation. Respiratory effort normal. Cardiovascular system: S1 & S2 heard, RRR.  Gastrointestinal system: Abdomen is nondistended, soft and mildly tender.  Central nervous system: Alert and oriented. No focal neurological deficits. Extremities: Symmetric  Skin: No rashes Psychiatry: Judgement and insight appear normal. Mood & affect appropriate.   Data Reviewed: I have personally reviewed following labs and imaging studies  CBC: Recent Labs  Lab 07/29/19 0400 07/30/19 1105 07/31/19 0402 08/01/19 0130 08/02/19 0352 08/03/19 0322 08/04/19 0443  WBC 9.6   < > 10.9* 11.3* 11.1* 13.5* 12.0*  NEUTROABS 5.9  --   --  8.0* 7.9* 11.1*  --   HGB 7.3*   < > 7.3* 8.1* 8.1* 8.0* 7.7*  HCT 24.9*   < > 24.3* 26.6* 26.1* 26.2* 25.3*  MCV 91.9   < > 92.0 89.9 90.0 90.0 89.7  PLT 379   < > 383 372 389 376 367   < > =  values in this interval not displayed.   Basic Metabolic Panel: Recent Labs  Lab 07/30/19 1105 07/30/19 1105 07/31/19 0402 08/01/19 0130 08/02/19 0352 08/03/19 0322 08/04/19 0443  NA 137   < > 137 136 138 139 137  K 3.5   < > 3.3* 3.7 3.7 3.9 3.0*  CL 105   < > 106 104 103 107 103  CO2 24   < > 25 24 24 24 25   GLUCOSE 148*   < > 189* 247* 183* 220* 135*  BUN 15   < > 19 25* 24* 21* 18  CREATININE 1.03*   < > 1.46* 1.27* 1.05* 1.11* 1.25*  CALCIUM 8.5*   < > 8.8* 8.7* 8.6* 8.5* 8.2*  MG 1.6*  --  1.8  --   --   --   --   PHOS 3.2  --   --   --   --   --   --    < > = values in this interval not displayed.   GFR: Estimated Creatinine Clearance: 78.9 mL/min (A) (by C-G formula based on SCr of 1.25 mg/dL  (H)). Liver Function Tests: Recent Labs  Lab 08/01/19 0130 08/02/19 0352 08/03/19 0322  AST 9* 10* 10*  ALT 9 8 10   ALKPHOS 92 87 92  BILITOT 0.4 0.7 0.7  PROT 6.1* 6.2* 6.1*  ALBUMIN 2.7* 2.7* 2.7*   No results for input(s): LIPASE, AMYLASE in the last 168 hours. No results for input(s): AMMONIA in the last 168 hours. Coagulation Profile: No results for input(s): INR, PROTIME in the last 168 hours. Cardiac Enzymes: No results for input(s): CKTOTAL, CKMB, CKMBINDEX, TROPONINI in the last 168 hours. BNP (last 3 results) No results for input(s): PROBNP in the last 8760 hours. HbA1C: No results for input(s): HGBA1C in the last 72 hours. CBG: Recent Labs  Lab 08/03/19 1158 08/03/19 1644 08/03/19 2042 08/04/19 0727 08/04/19 1150  GLUCAP 224* 202* 187* 136* 107*   Lipid Profile: No results for input(s): CHOL, HDL, LDLCALC, TRIG, CHOLHDL, LDLDIRECT in the last 72 hours. Thyroid Function Tests: No results for input(s): TSH, T4TOTAL, FREET4, T3FREE, THYROIDAB in the last 72 hours. Anemia Panel: No results for input(s): VITAMINB12, FOLATE, FERRITIN, TIBC, IRON, RETICCTPCT in the last 72 hours. Sepsis Labs: No results for input(s): PROCALCITON, LATICACIDVEN in the last 168 hours.  No results found for this or any previous visit (from the past 240 hour(s)).    Radiology Studies: CT ABDOMEN WO CONTRAST  Result Date: 08/03/2019 CLINICAL DATA:  54 year old female with abdominal pain. Status post recent endoscopy. EXAM: CT ABDOMEN WITHOUT CONTRAST TECHNIQUE: Multidetector CT imaging of the abdomen was performed following the standard protocol without IV contrast. COMPARISON:  CT abdomen pelvis dated 07/22/2019. FINDINGS: Evaluation of this exam is limited in the absence of intravenous contrast. Lower chest: There is diffuse interstitial and interlobular septal prominence which may represent mild edema. Clinical correlation is recommended. No free air or free fluid identified in the  visualized upper abdomen. Hepatobiliary: Fatty infiltration of the liver. No intrahepatic biliary ductal dilatation. Cholecystectomy. Pancreas: Unremarkable. No pancreatic ductal dilatation or surrounding inflammatory changes. Spleen: Normal in size without focal abnormality. Adrenals/Urinary Tract: The adrenal glands and kidneys are unremarkable. Stomach/Bowel: There is a 15 mm linear radiopaque metallic density in the region of the duodenal bulb, likely an ingested foreign object. No bowel obstruction or active inflammation in the visualized abdomen. Vascular/Lymphatic: Mild atherosclerotic calcification of the aorta. No adenopathy. Other: None Musculoskeletal: Mild degenerative  changes of the spine. No acute osseous pathology. IMPRESSION: 1. A 15 mm linear radiopaque metallic density in the region of the duodenal bulb, likely an ingested foreign object. No bowel obstruction or active inflammation in the visualized abdomen. No evidence of perforation. 2. Fatty liver. 3. Aortic Atherosclerosis (ICD10-I70.0). Electronically Signed   By: Anner Crete M.D.   On: 08/03/2019 15:30     Scheduled Meds: . atorvastatin  10 mg Oral Daily  . DULoxetine  40 mg Oral Daily  . erythromycin  250 mg Oral TID WC & HS  . ferrous sulfate  325 mg Oral Q breakfast  . hydrALAZINE  5 mg Intravenous Q6H  . insulin aspart  0-20 Units Subcutaneous TID WC  . insulin aspart  0-5 Units Subcutaneous QHS  . insulin aspart  5 Units Subcutaneous TID WC  . insulin glargine  35 Units Subcutaneous Daily  . isosorbide mononitrate  60 mg Oral Daily  . metoprolol succinate  50 mg Oral Daily  . pantoprazole (PROTONIX) IV  40 mg Intravenous Q12H   Continuous Infusions: . cefTRIAXone (ROCEPHIN)  IV 200 mL/hr at 08/04/19 1000  . metronidazole 500 mg (08/04/19 1157)     LOS: 13 days    Donnamae Jude, MD 08/04/2019 1:23 PM [phone] [facility] If 7PM-7AM, please contact night-coverage 08/04/2019, 1:23 PM

## 2019-08-04 NOTE — Progress Notes (Signed)
[facility] Progress Note  Deborah [name] 54 y.o. Jul 15, 1965  CC: Anemia, nausea vomiting, abdominal pain   Subjective: Patient seen and examined at bedside.  Underwent EGD with removal of duodenal bulb polyp with hot snare on 05/27  One clip was placed.  She developed worsening abdominal pain, nausea and vomiting after the procedure.  CT scan yesterday showed no acute changes and no postprocedure complications.  She is feeling better today.  Abdominal pain has improved.  Continues to have nausea and vomiting which is chronic for her.  ROS : Denies any fever. Denies chest pain.   Objective: Vital signs in last 24 hours: Vitals:   08/04/19 0535 08/04/19 0820  BP: 130/82 124/70  Pulse: 91 92  Resp: 18   Temp: 97.6 F (36.4 C)   SpO2: 97%     Physical Exam:  General:  Alert, cooperative, no distress, appears stated age  Head:  Normocephalic, without obvious abnormality, atraumatic  Eyes:  , EOM's intact,   Lungs:    respirations unlabored     Abdomen:   Soft, nontender, nondistended, obese abdomen, bowel sounds are present.  Do not appreciate any peritoneal signs  Extremities: Extremities normal, atraumatic, no  edema       Lab Results: Recent Labs    08/03/19 0322 08/04/19 0443  NA 139 137  K 3.9 3.0*  CL 107 103  CO2 24 25  GLUCOSE 220* 135*  BUN 21* 18  CREATININE 1.11* 1.25*  CALCIUM 8.5* 8.2*   Recent Labs    08/02/19 0352 08/03/19 0322  AST 10* 10*  ALT 8 10  ALKPHOS 87 92  BILITOT 0.7 0.7  PROT 6.2* 6.1*  ALBUMIN 2.7* 2.7*   Recent Labs    08/02/19 0352 08/02/19 0352 08/03/19 0322 08/04/19 0443  WBC 11.1*   < > 13.5* 12.0*  NEUTROABS 7.9*  --  11.1*  --   HGB 8.1*   < > 8.0* 7.7*  HCT 26.1*   < > 26.2* 25.3*  MCV 90.0   < > 90.0 89.7  PLT 389   < > 376 367   < > = values in this interval not displayed.   No results for input(s): LABPROT, INR in the last 72 hours.    Assessment/Plan: -Duodenal bulb polyp biopsy  revealed carcinoid tumor.  Underwent repeat EGD 05/27  with removal of duodenal polyp with hot snare.  Procedure was somewhat difficult because of the location as well as patient's body habitus.  One clip was placed. -Nausea and vomiting. -Abdominal pain.  Iron deficiency anemia. Colonoscopy yesterday negative for active bleeding. Had a fair prep. 2 diminutive polyps removed with biopsy forcep. One 10 mm polyp removed with hot snare.  Pathology revealed tubular adenomas. -Gastroparesis. -History of atrial fibrillation. Eliquis on hold  -History of CHF  Recommendations --------------------------- -CT scan yesterday showed no postprocedure complications.  Metallic object in the duodenal bulb is the clip that was placed after polypectomy. -Slowly advance diet as tolerated -Continue IV twice daily PPI while in the hospital.  Recommend discharge on Protonix 40 mg twice a day for 4 weeks followed by 40 mg once a day for another 4 weeks. -Continue antibiotics for total 7 days.  Okay to discharge her on oral antibiotics once stable for discharge -Okay to resume anticoagulation from GI standpoint. -She will need repeat EGD in few months to document complete polypectomy -GI will sign off.  Call us back if needed    Otis Brace MD,  FACP 08/04/2019, 9:54 AM  Contact #  [phone]

## 2019-08-05 LAB — GLUCOSE, CAPILLARY
Glucose-Capillary: 161 mg/dL — ABNORMAL HIGH (ref 70–99)
Glucose-Capillary: 211 mg/dL — ABNORMAL HIGH (ref 70–99)
Glucose-Capillary: 250 mg/dL — ABNORMAL HIGH (ref 70–99)
Glucose-Capillary: 176 mg/dL — ABNORMAL HIGH (ref 70–99)

## 2019-08-05 MED ORDER — INSULIN ASPART 100 UNIT/ML ~~LOC~~ SOLN
8.0000 [IU] | Freq: Three times a day (TID) | SUBCUTANEOUS | Status: DC
  Administered 2019-08-05 – 2019-08-09 (×10): 8 [IU] via SUBCUTANEOUS

## 2019-08-05 MED ORDER — POTASSIUM CHLORIDE CRYS ER 20 MEQ PO TBCR
20.0000 meq | EXTENDED_RELEASE_TABLET | Freq: Two times a day (BID) | ORAL | Status: AC
  Administered 2019-08-05 – 2019-08-07 (×6): 20 meq via ORAL
  Filled 2019-08-05 (×6): qty 1

## 2019-08-05 MED ORDER — INSULIN GLARGINE 100 UNIT/ML ~~LOC~~ SOLN
40.0000 [IU] | Freq: Every day | SUBCUTANEOUS | Status: DC
  Administered 2019-08-06 – 2019-08-09 (×4): 40 [IU] via SUBCUTANEOUS
  Filled 2019-08-05 (×4): qty 0.4

## 2019-08-05 NOTE — Progress Notes (Signed)
Patient ID: Deborah [name], female   DOB: 16-Feb-1966, 54 y.o.   MRN: [id number]  PROGRESS NOTE    Deborah [name]  LPF:[id number] DOB: [birth year] DOA: 07/21/2019 PCP: [name], DO    Brief Narrative:  54 year old female with history of depression, nonhemorrhagic CVA, chronic systolic CHF PAF on Eliquis, acute distress 3 8, hypertension, gout, GERD/gastric ulcer, anemia of chronic disease T2DM with recurrent admission for DKA/diabetic gastroparesis presented with nausea vomiting diffuse abdominal pain. Recently was admitted for gastroparesis earlier this month GI. Patient on presentation had tachycardia tachypnea and lactic acidosis was admitted, seen by GI. Underwent GES which was abnormal then EGD to rule out gastric outlet obstruction but no acute finding, underwent colonoscopy subsequently that showed 2 to 3 mm polyp in the descending colon which was biopsied,. Biopsy findings showed well-differentiated neuroendocrine tumor Patient underwent EGD with polypectomy 5/27.   Assessment & Plan:   Active Problems:   Essential hypertension, benign   Uncontrolled type 2 diabetes mellitus with diabetic neuropathy, with long-term current use of insulin (HCC)   CKD (chronic kidney disease), stage II   Diabetic gastroparesis (HCC)   Morbid obesity (HCC)   Anxiety   Intractable nausea and vomiting   Chronic systolic CHF (congestive heart failure) ([place])   Uncontrolled type 2 diabetes mellitus with hyperglycemia ([place])   Hyperosmolar hyperglycemic state (HHS) ([place])   Drug-seeking behavior   Abdominal pain/gastroparesis:Exacerbation of gastroparesis islikely secondary to her uncontrolled diabetes. She has been admitted multiple times for the same problem. On erythromycin 3 times daily. She has history of noncompliance. GI consulted and she underwent gastric emptying test, EGD and colonoscopy.Discussed dietary changes. On Zofran/Phenergan, erythromycin,PPI twice daily.She  had Endo Clip placedtoo.GI has signed off. Begin soft diet, continue this today--perhaps transition to regular diet tomorrow and home if tolerates that.  Neuroendocrine tumor: Newly diagnosed well-differentiated neuroendocrine tumorand adrenal area, underwent a repeat EGD 5/27 with removal of the polyp.continue treatment as #1.  Type 2 diabetes mellitus: Recent hemoglobin A1c of 8.4. have increased her Lantus 35-->40 units and increased her meal coverage Novolog 5-->8 u tid. Continue sliding scale. Diabetic coordinator has been following.  AKI on CKD stage IIIa:Baseline creatinine 1.3. Creatinine creeped up to the level of 2.1.Improved with IV fluids--down to 1.25 last check.Torsemide has been on hold due to AKI.  Suspected sepsis: Sepsis ruled out. Antibiotics discontinued. She was tachypneic, tachycardic with lactic acidosis on presentation. Elevated white cell count on presentation. Lactate is normalized. Procalcitonin negative.  Chronic systolic CHF: Continue input/output monitoring, daily weight. Euvolemic Currently on Toprol, hydralazine. Echo showed ejection fraction 45 to 50%, global hypokinesis, moderately elevated pulmonary artery systolic pressure. The findings are similar to the previous echo on 11/20.  Hypertension: Currently blood pressure stable.Continue current medications  Paroxysmal A. fib:On Eliquis for anticoagulation resumed today. On Toprol for rate control.  Hypertension: BP is well controlled on Imdur and prn hydalazine  History of nonhemorrhagic CVA: Continue current medications. Lipitor LDL 37, resume Eliquis  GERD: continue PPI  Chronic anxiety/depression: continue Cymbalta  Hyperlipidemia:On statin at home.  Morbid obesity: BMI 51.6.  Hypokalemia:Supplemented with potassium  Hypomagnesemia: Improved  Normocytic anemia: Hemoglobindropped to7.1 at one point .Iron level low normal. No evidence of acute blood loss.  Could be associated with her CKD. Transfused with aunit of PRBC during this hospitalization. Hemoglobin in the range of 7.  Tardive dyskinesia: Has involuntary movement of her tongue and mouth consistent with tardive dyskinesia. Could be associated with Reglan that she was using  in the past.    DVT prophylaxis: VQ:[id number] Code Status: Full code  Family Communication: Discussed with patient at bedside Disposition Plan: Home if can tolerate regular diet tomorrow or next day   Consultants:   GI  Procedures:  Gastric emptying study  EGD 5/21, 5/27  colonoscopy  Antimicrobials: Anti-infectives (From admission, onward)   Start     Dose/Rate Route Frequency Ordered Stop   08/03/19 1000  cefTRIAXone (ROCEPHIN) 2 g in sodium chloride 0.9 % 100 mL IVPB  Status:  Discontinued     2 g 200 mL/hr over 30 Minutes Intravenous Every 24 hours 08/03/19 0900 08/04/19 1338   08/03/19 1000  metroNIDAZOLE (FLAGYL) IVPB 500 mg  Status:  Discontinued     500 mg 100 mL/hr over 60 Minutes Intravenous Every 8 hours 08/03/19 0900 08/04/19 1338   07/26/19 1700  erythromycin (E-MYCIN) tablet 250 mg     250 mg Oral 3 times daily with meals & bedtime 07/26/19 1312     07/24/19 1700  erythromycin (E-MYCIN) tablet 250 mg  Status:  Discontinued     250 mg Oral 3 times daily with meals & bedtime 07/24/19 1205 07/25/19 0838   07/22/19 0800  erythromycin (E-MYCIN) tablet 250 mg  Status:  Discontinued     250 mg Oral 3 times daily with meals & bedtime 07/22/19 0723 07/23/19 1507   07/22/19 0800  ciprofloxacin (CIPRO) IVPB 400 mg  Status:  Discontinued     400 mg 200 mL/hr over 60 Minutes Intravenous Every 12 hours 07/22/19 0734 07/23/19 0833   07/22/19 0800  metroNIDAZOLE (FLAGYL) IVPB 500 mg  Status:  Discontinued     500 mg 100 mL/hr over 60 Minutes Intravenous Every 8 hours 07/22/19 0734 07/23/19 0833       Subjective: Is nauseated today following eggs + bagel  Objective: Vitals:   08/04/19  2124 08/05/19 0525 08/05/19 1045 08/05/19 1305  BP: (!) 142/85 123/69 (!) 136/92 116/60  Pulse: 93 92 78 77  Resp: 18 18  18   Temp: 98.1 F (36.7 C) 98.3 F (36.8 C)    TempSrc: Oral Oral    SpO2: 100% 98%  99%  Weight:  (!) 153.5 kg    Height:        Intake/Output Summary (Last 24 hours) at 08/05/2019 1510 Last data filed at 08/05/2019 1100 Gross per 24 hour  Intake 480 ml  Output --  Net 480 ml   Filed Weights   08/02/19 0801 08/03/19 0541 08/05/19 0525  Weight: (!) 157 kg (!) 156.7 kg (!) 153.5 kg    Examination:  General exam: Appears calm and comfortable, morbidly obese  Respiratory system: Clear to auscultation. Respiratory effort normal. Cardiovascular system: S1 & S2 heard, RRR.  Gastrointestinal system: Abdomen is nondistended, soft and nontender.  Central nervous system: Alert and oriented. No focal neurological deficits. Extremities: Symmetric  Skin: No rashes Psychiatry: Judgement and insight appear normal. Mood & affect appropriate.   Data Reviewed: I have personally reviewed following labs and imaging studies  CBC: Recent Labs  Lab 07/31/19 0402 08/01/19 0130 08/02/19 0352 08/03/19 0322 08/04/19 0443  WBC 10.9* 11.3* 11.1* 13.5* 12.0*  NEUTROABS  --  8.0* 7.9* 11.1*  --   HGB 7.3* 8.1* 8.1* 8.0* 7.7*  HCT 24.3* 26.6* 26.1* 26.2* 25.3*  MCV 92.0 89.9 90.0 90.0 89.7  PLT 383 372 389 376 950   Basic Metabolic Panel: Recent Labs  Lab 07/30/19 1105 07/30/19 1105 07/31/19 0402 08/01/19 0130 08/02/19 0352  08/03/19 0322 08/04/19 0443  NA 137   < > 137 136 138 139 137  K 3.5   < > 3.3* 3.7 3.7 3.9 3.0*  CL 105   < > 106 104 103 107 103  CO2 24   < > 25 24 24 24 25   GLUCOSE 148*   < > 189* 247* 183* 220* 135*  BUN 15   < > 19 25* 24* 21* 18  CREATININE 1.03*   < > 1.46* 1.27* 1.05* 1.11* 1.25*  CALCIUM 8.5*   < > 8.8* 8.7* 8.6* 8.5* 8.2*  MG 1.6*  --  1.8  --   --   --   --   PHOS 3.2  --   --   --   --   --   --    < > = values in this  interval not displayed.   GFR: Estimated Creatinine Clearance: 77.9 mL/min (A) (by C-G formula based on SCr of 1.25 mg/dL (H)). Liver Function Tests: Recent Labs  Lab 08/01/19 0130 08/02/19 0352 08/03/19 0322  AST 9* 10* 10*  ALT 9 8 10   ALKPHOS 92 87 92  BILITOT 0.4 0.7 0.7  PROT 6.1* 6.2* 6.1*  ALBUMIN 2.7* 2.7* 2.7*   CBG: Recent Labs  Lab 08/04/19 1150 08/04/19 1653 08/04/19 2130 08/05/19 0747 08/05/19 1139  GLUCAP 107* 243* 238* 161* 211*     Scheduled Meds: . apixaban  5 mg Oral BID  . atorvastatin  10 mg Oral Daily  . DULoxetine  40 mg Oral Daily  . erythromycin  250 mg Oral TID WC & HS  . ferrous sulfate  325 mg Oral Q breakfast  . hydrALAZINE  5 mg Intravenous Q6H  . insulin aspart  0-20 Units Subcutaneous TID WC  . insulin aspart  0-5 Units Subcutaneous QHS  . insulin aspart  5 Units Subcutaneous TID WC  . insulin glargine  35 Units Subcutaneous Daily  . isosorbide mononitrate  60 mg Oral Daily  . metoprolol succinate  50 mg Oral Daily  . pantoprazole (PROTONIX) IV  40 mg Intravenous Q12H  . potassium chloride  20 mEq Oral BID   Continuous Infusions:   LOS: 14 days    Donnamae Jude, MD 08/05/2019 3:10 PM [phone] [facility] If 7PM-7AM, please contact night-coverage 08/05/2019, 3:10 PM

## 2019-08-06 DIAGNOSIS — R109 Unspecified abdominal pain: Secondary | ICD-10-CM

## 2019-08-06 LAB — GLUCOSE, CAPILLARY
Glucose-Capillary: 163 mg/dL — ABNORMAL HIGH (ref 70–99)
Glucose-Capillary: 195 mg/dL — ABNORMAL HIGH (ref 70–99)
Glucose-Capillary: 327 mg/dL — ABNORMAL HIGH (ref 70–99)
Glucose-Capillary: 222 mg/dL — ABNORMAL HIGH (ref 70–99)

## 2019-08-06 NOTE — Progress Notes (Signed)
Patient ID: Deborah [name], female   DOB: 1965-07-17, 54 y.o.   MRN: [id number]  PROGRESS NOTE    Deborah [name]  IRW:[id number] DOB: Feb 14, 1966 DOA: 07/21/2019 PCP: [name], DO    Brief Narrative:  54 year old female with history of depression, nonhemorrhagic CVA, chronic systolic CHF PAF on Eliquis, acute distress 3 8, hypertension, gout, GERD/gastric ulcer, anemia of chronic disease T2DM with recurrent admission for DKA/diabetic gastroparesis presented with nausea vomiting diffuse abdominal pain. Recently was admitted for gastroparesis earlier this month GI. Patient on presentation had tachycardia tachypnea and lactic acidosis was admitted, seen by GI. Underwent GES which was abnormal then EGD to rule out gastric outlet obstruction but no acute finding, underwent colonoscopy subsequently that showed 2 to 3 mm polyp in the descending colon which was biopsied,. Biopsy findings showed well-differentiated neuroendocrine tumor Patient underwent EGD with polypectomy 5/27.  Assessment & Plan:   Active Problems:   Essential hypertension, benign   Uncontrolled type 2 diabetes mellitus with diabetic neuropathy, with long-term current use of insulin (HCC)   CKD (chronic kidney disease), stage II   Diabetic gastroparesis (HCC)   Morbid obesity (HCC)   Anxiety   Intractable nausea and vomiting   Chronic systolic CHF (congestive heart failure) ([place])   Uncontrolled type 2 diabetes mellitus with hyperglycemia ([place])   Hyperosmolar hyperglycemic state (HHS) ([place])   Drug-seeking behavior  Abdominal pain/gastroparesis:Exacerbation of gastroparesis islikely secondary to her uncontrolled diabetes. She has been admitted multiple times for the same problem. On erythromycin 3 times daily. She has history of noncompliance. GI consulted and she underwent gastric emptying test,EGD and colonoscopy.Discussed dietary changes.OnZofran/Phenergan, erythromycin,PPI twice daily.She had  Endo Clip placedtoo.GI has signed off. Begin soft diet, continue this today--perhaps transition to regular diet tomorrow and home if tolerates that.  Neuroendocrine tumor:Newly diagnosed well-differentiated neuroendocrine tumorand adrenal area, underwent a repeat EGD 5/27 with removal of the polyp.continue treatment as #1.  Type 2 diabetes mellitus: Recent hemoglobin A1c of 8.4. have increased her Lantus35-->40 units and increased her meal coverage Novolog 5-->8 u tid. Continue sliding scale. Diabetic coordinator has been following.  AKI on CKD stage IIIa:Baseline creatinine 1.3. Creatinine creeped up to the level of 2.1.Improved with IV fluids--down to 1.25 last check.Torsemide has been on hold due to AKI.  Suspected sepsis: Sepsis ruled out. Antibiotics discontinued. She was tachypneic, tachycardic with lactic acidosis on presentation. Elevated white cell count on presentation. Lactate is normalized. Procalcitonin negative.  Chronic systolic CHF: Continue input/output monitoring, daily weight.EuvolemicCurrently on Toprol, hydralazine. Echo showed ejection fraction 45 to 50%, global hypokinesis, moderately elevated pulmonary artery systolic pressure. The findings are similar to the previous echo on 11/20.  Hypertension: Currently blood pressure stable.Continue current medications  Paroxysmal A. fib:On Eliquis for anticoagulationresumed today. On Toprol for rate control.  Hypertension: BP is well controlled on Imdur and prn hydalazine  History of nonhemorrhagic CVA: Continue current medications.Lipitor LDL 37, resume Eliquis  GERD:continue PPI  Chronic anxiety/depression:continue Cymbalta  Hyperlipidemia:On statin at home.  Morbid obesity: BMI 51.6.  Hypokalemia:Supplemented with potassium  Hypomagnesemia:Improved  Normocytic anemia: Hemoglobindropped to7.1 at one point .Iron level low normal. No evidence of acute blood loss. Could  be associated with her CKD. Transfused with aunit of PRBC during this hospitalization. Hemoglobin in the range of 7.  Tardive dyskinesia: Has involuntary movement of her tongue and mouth consistent with tardive dyskinesia. Could be associated with Reglan that she was using in the past.    DVT prophylaxis: PY:PPJKDTO Code Status: Full code  Family Communication: discussed at bedside Disposition Plan: Home if can tolerate regular diet   Consultants:   GI  Procedures:  Gastric emptying study  EGD5/21, 5/27  colonoscopy  Antimicrobials: Anti-infectives (From admission, onward)   Start     Dose/Rate Route Frequency Ordered Stop   08/03/19 1000  cefTRIAXone (ROCEPHIN) 2 g in sodium chloride 0.9 % 100 mL IVPB  Status:  Discontinued     2 g 200 mL/hr over 30 Minutes Intravenous Every 24 hours 08/03/19 0900 08/04/19 1338   08/03/19 1000  metroNIDAZOLE (FLAGYL) IVPB 500 mg  Status:  Discontinued     500 mg 100 mL/hr over 60 Minutes Intravenous Every 8 hours 08/03/19 0900 08/04/19 1338   07/26/19 1700  erythromycin (E-MYCIN) tablet 250 mg     250 mg Oral 3 times daily with meals & bedtime 07/26/19 1312     07/24/19 1700  erythromycin (E-MYCIN) tablet 250 mg  Status:  Discontinued     250 mg Oral 3 times daily with meals & bedtime 07/24/19 1205 07/25/19 0838   07/22/19 0800  erythromycin (E-MYCIN) tablet 250 mg  Status:  Discontinued     250 mg Oral 3 times daily with meals & bedtime 07/22/19 0723 07/23/19 1507   07/22/19 0800  ciprofloxacin (CIPRO) IVPB 400 mg  Status:  Discontinued     400 mg 200 mL/hr over 60 Minutes Intravenous Every 12 hours 07/22/19 0734 07/23/19 0833   07/22/19 0800  metroNIDAZOLE (FLAGYL) IVPB 500 mg  Status:  Discontinued     500 mg 100 mL/hr over 60 Minutes Intravenous Every 8 hours 07/22/19 0734 07/23/19 0833       Subjective: Still having some Nausea with soft diet--I tried to convince her to go home today, and she was reluctant  today  Objective: Vitals:   08/06/19 0615 08/06/19 0926 08/06/19 1304 08/06/19 1354  BP: 114/67 (!) 106/50 120/69 132/70  Pulse: 87 87 80 83  Resp: 18  16 18   Temp: 98 F (36.7 C)  (!) 97.5 F (36.4 C) 98.1 F (36.7 C)  TempSrc: Oral  Oral Oral  SpO2: 97%  96% 99%  Weight:  (!) 154.3 kg    Height:        Intake/Output Summary (Last 24 hours) at 08/06/2019 1410 Last data filed at 08/06/2019 1047 Gross per 24 hour  Intake 360 ml  Output --  Net 360 ml   Filed Weights   08/03/19 0541 08/05/19 0525 08/06/19 0926  Weight: (!) 156.7 kg (!) 153.5 kg (!) 154.3 kg    Examination:  General exam: Appears calm and comfortable, morbidly obese  Respiratory system: Clear to auscultation. Respiratory effort normal. Cardiovascular system: S1 & S2 heard, RRR.  Gastrointestinal system: Abdomen is nondistended, soft and nontender.  Central nervous system: Alert and oriented. No focal neurological deficits. Extremities: Symmetric  Skin: No rashes Psychiatry: Judgement and insight appear normal. Mood & affect appropriate.     Data Reviewed: I have personally reviewed following labs and imaging studies  CBC: Recent Labs  Lab 07/31/19 0402 08/01/19 0130 08/02/19 0352 08/03/19 0322 08/04/19 0443  WBC 10.9* 11.3* 11.1* 13.5* 12.0*  NEUTROABS  --  8.0* 7.9* 11.1*  --   HGB 7.3* 8.1* 8.1* 8.0* 7.7*  HCT 24.3* 26.6* 26.1* 26.2* 25.3*  MCV 92.0 89.9 90.0 90.0 89.7  PLT 383 372 389 376 301   Basic Metabolic Panel: Recent Labs  Lab 07/31/19 0402 08/01/19 0130 08/02/19 0352 08/03/19 0322 08/04/19 0443  NA 137  136 138 139 137  K 3.3* 3.7 3.7 3.9 3.0*  CL 106 104 103 107 103  CO2 25 24 24 24 25   GLUCOSE 189* 247* 183* 220* 135*  BUN 19 25* 24* 21* 18  CREATININE 1.46* 1.27* 1.05* 1.11* 1.25*  CALCIUM 8.8* 8.7* 8.6* 8.5* 8.2*  MG 1.8  --   --   --   --    GFR: Estimated Creatinine Clearance: 78.1 mL/min (A) (by C-G formula based on SCr of 1.25 mg/dL (H)). Liver Function  Tests: Recent Labs  Lab 08/01/19 0130 08/02/19 0352 08/03/19 0322  AST 9* 10* 10*  ALT 9 8 10   ALKPHOS 92 87 92  BILITOT 0.4 0.7 0.7  PROT 6.1* 6.2* 6.1*  ALBUMIN 2.7* 2.7* 2.7*   CBG: Recent Labs  Lab 08/05/19 1139 08/05/19 1624 08/05/19 2152 08/06/19 0806 08/06/19 1156  GLUCAP 211* 250* 176* 163* 195*     Radiology Studies: No results found.   Scheduled Meds: . apixaban  5 mg Oral BID  . atorvastatin  10 mg Oral Daily  . DULoxetine  40 mg Oral Daily  . erythromycin  250 mg Oral TID WC & HS  . ferrous sulfate  325 mg Oral Q breakfast  . hydrALAZINE  5 mg Intravenous Q6H  . insulin aspart  0-20 Units Subcutaneous TID WC  . insulin aspart  0-5 Units Subcutaneous QHS  . insulin aspart  8 Units Subcutaneous TID WC  . insulin glargine  40 Units Subcutaneous Daily  . isosorbide mononitrate  60 mg Oral Daily  . metoprolol succinate  50 mg Oral Daily  . pantoprazole (PROTONIX) IV  40 mg Intravenous Q12H  . potassium chloride  20 mEq Oral BID   Continuous Infusions:   LOS: 15 days    Donnamae Jude, MD 08/06/2019 2:10 PM [phone] [facility] If 7PM-7AM, please contact night-coverage 08/06/2019, 2:10 PM

## 2019-08-07 ENCOUNTER — Ambulatory Visit: Payer: Self-pay | Admitting: Licensed Clinical Social Worker

## 2019-08-07 ENCOUNTER — Encounter: Payer: Self-pay | Admitting: *Deleted

## 2019-08-07 DIAGNOSIS — E860 Dehydration: Secondary | ICD-10-CM

## 2019-08-07 LAB — COMPREHENSIVE METABOLIC PANEL
ALT: 10 U/L (ref 0–44)
AST: 11 U/L — ABNORMAL LOW (ref 15–41)
Albumin: 2.7 g/dL — ABNORMAL LOW (ref 3.5–5.0)
Alkaline Phosphatase: 88 U/L (ref 38–126)
Anion gap: 9 (ref 5–15)
BUN: 30 mg/dL — ABNORMAL HIGH (ref 6–20)
CO2: 23 mmol/L (ref 22–32)
Calcium: 8.4 mg/dL — ABNORMAL LOW (ref 8.9–10.3)
Chloride: 108 mmol/L (ref 98–111)
Creatinine, Ser: 1.28 mg/dL — ABNORMAL HIGH (ref 0.44–1.00)
GFR calc Af Amer: 54 mL/min — ABNORMAL LOW (ref >60)
GFR calc non Af Amer: 47 mL/min — ABNORMAL LOW (ref >60)
Glucose, Bld: 157 mg/dL — ABNORMAL HIGH (ref 70–99)
Potassium: 3.8 mmol/L (ref 3.5–5.1)
Sodium: 140 mmol/L (ref 135–145)
Total Bilirubin: 0.7 mg/dL (ref 0.3–1.2)
Total Protein: 6.4 g/dL — ABNORMAL LOW (ref 6.5–8.1)

## 2019-08-07 LAB — GLUCOSE, CAPILLARY
Glucose-Capillary: 163 mg/dL — ABNORMAL HIGH (ref 70–99)
Glucose-Capillary: 170 mg/dL — ABNORMAL HIGH (ref 70–99)
Glucose-Capillary: 179 mg/dL — ABNORMAL HIGH (ref 70–99)
Glucose-Capillary: 214 mg/dL — ABNORMAL HIGH (ref 70–99)
Glucose-Capillary: 253 mg/dL — ABNORMAL HIGH (ref 70–99)
Glucose-Capillary: 255 mg/dL — ABNORMAL HIGH (ref 70–99)

## 2019-08-07 LAB — CBC WITH DIFFERENTIAL/PLATELET
Abs Immature Granulocytes: 0.06 10*3/uL (ref 0.00–0.07)
Basophils Absolute: 0.1 10*3/uL (ref 0.0–0.1)
Basophils Relative: 1 %
Eosinophils Absolute: 0.4 10*3/uL (ref 0.0–0.5)
Eosinophils Relative: 4 %
HCT: 27.9 % — ABNORMAL LOW (ref 36.0–46.0)
Hemoglobin: 8.2 g/dL — ABNORMAL LOW (ref 12.0–15.0)
Immature Granulocytes: 1 %
Lymphocytes Relative: 18 %
Lymphs Abs: 1.9 10*3/uL (ref 0.7–4.0)
MCH: 26.8 pg (ref 26.0–34.0)
MCHC: 29.4 g/dL — ABNORMAL LOW (ref 30.0–36.0)
MCV: 91.2 fL (ref 80.0–100.0)
Monocytes Absolute: 0.5 10*3/uL (ref 0.1–1.0)
Monocytes Relative: 5 %
Neutro Abs: 7.5 10*3/uL (ref 1.7–7.7)
Neutrophils Relative %: 71 %
Platelets: 361 10*3/uL (ref 150–400)
RBC: 3.06 MIL/uL — ABNORMAL LOW (ref 3.87–5.11)
RDW: 15 % (ref 11.5–15.5)
WBC: 10.4 10*3/uL (ref 4.0–10.5)
nRBC: 0 % (ref 0.0–0.2)

## 2019-08-07 LAB — SURGICAL PATHOLOGY

## 2019-08-07 MED ORDER — ERYTHROMYCIN BASE 250 MG PO TABS: 250.0000 mg | ORAL_TABLET | Freq: Three times a day (TID) | ORAL | 0 refills | Status: DC

## 2019-08-07 NOTE — Progress Notes (Signed)
Nutrition Brief Note  Patient identified for LOS (day #16)  Wt Readings from Last 15 Encounters:  08/07/19 (!) 157.5 kg  07/19/19 (!) 151.1 kg  07/04/19 (!) 156.1 kg  06/23/19 (!) 152 kg  06/14/19 (!) 152.7 kg  06/04/19 (!) 150.6 kg  05/08/19 (!) 161.5 kg  04/06/19 (!) 161.5 kg  03/16/19 (!) 160.6 kg  03/10/19 (!) 151.5 kg  02/23/19 (!) 151 kg  01/28/19 (!) 151.9 kg  12/18/18 (!) 150 kg  12/15/18 (!) 150 kg  12/13/18 (!) 150.7 kg    Body mass index is 56.04 kg/m. Patient meets criteria for morbid obesity based on current BMI. Skin WDL.  Patient was admitted for N/V and abdominal pain. She has hx of gastroparesis. Newly dx this admission with neuroendocrine tumor s/p EGD 5/27 with removal of polyp.   Patient has hx of tardive dyskinesia and notes indicate involuntary movement of tongue and mouth.   Diet has changed frequently throughout hospitalization. Most recently, advanced from CLD to [place] on 5/28 at 1540 and to Soft on 5/29 at 1323. Per flow sheet documented, she consumed 0% of breakfast, 75% of lunch, and 100% of dinner on 5/29; 75% of breakfast, 100% of lunch and dinner on 5/31.  Labs and medications reviewed. Note from yesterday states plan to d/c home if patient continues to tolerate diet. Patient is currently out of the room to CT.   No nutrition interventions warranted at this time. If nutrition issues arise, please consult RD.      Jarome Matin, MS, RD, LDN, CNSC Inpatient Clinical Dietitian RD pager # available in [place]  After hours/weekend pager # available in [facility]

## 2019-08-07 NOTE — Discharge Summary (Addendum)
Physician Discharge Summary  KENEDY HAISLEY QQP:[id number] DOB: [birth year] DOA: 07/21/2019  PCP: [name], DO  Admit date: 07/21/2019 Discharge date: 08/07/2019  Time spent: 35 minutes  Recommendations for Outpatient Follow-up:  1. Need pain management and 1 sepcific prescriber to manage her meds 2. NO OPIATES int he futute--is a FPTS patient--am cc'ing Dr. Garlan Fillers to ensure not lost to follow up 3. conisder further GIn input as OP--going home on EEC  Discharge Diagnoses:  Active Problems:   Essential hypertension, benign   Uncontrolled type 2 diabetes mellitus with diabetic neuropathy, with long-term current use of insulin (HCC)   CKD (chronic kidney disease), stage II   Diabetic gastroparesis (HCC)   Morbid obesity (HCC)   Anxiety   Intractable nausea and vomiting   Chronic systolic CHF (congestive heart failure) ([place])   Uncontrolled type 2 diabetes mellitus with hyperglycemia ([place])   Hyperosmolar hyperglycemic state (HHS) ([place])   Drug-seeking behavior   Discharge Condition: Improved  Diet recommendation: Soft diabetic  Filed Weights   08/05/19 0525 08/06/19 0926 08/07/19 0443  Weight: (!) 153.5 kg (!) 154.3 kg (!) 157.5 kg    History of present illness:  54 year old female with history of depression, nonhemorrhagic CVA, chronic systolic CHF PAF on Eliquis, acute distress 3 8, hypertension, gout, GERD/gastric ulcer, anemia of chronic disease T2DM with recurrent admission for DKA/diabetic gastroparesis presented with nausea vomiting diffuse abdominal pain. Recently was admitted for gastroparesis earlier this month GI. Patient on presentation had tachycardia tachypnea and lactic acidosis was admitted, seen by GI. Underwent GES which was abnormal then EGD to rule out gastric outlet obstruction but no acute finding, underwent colonoscopy subsequently that showed 2 to 3 mm polyp in the descending colon which was biopsied,. Biopsy findings showed well-differentiated  neuroendocrine tumor Patient underwent EGD with polypectomy 5/27.   Hospital Course:   Abdominal pain/gastroparesis:Exacerbation of gastroparesis islikely secondary to her uncontrolled diabetes. She has been admitted multiple times for the same problem. On erythromycin 3 times daily. She has history of noncompliance.  GI consulted and she underwent gastric emptying test,EGD and colonoscopy.Discussed dietary changes. OnZofran/Phenergan, erythromycin,PPI twice daily.She had Endo Clip placedtoo. GI has signed off. She will d/c home on soft diet. I had a long conversation with mother Marvel Sapp who mentions that her being hospital is common and she uses "dilauda" She will go home on NO FURTHER NARCOTICS and this has been made clear to her --when I asked her what she is used other than Dilaudid she tells me that she is "allergic" to Tylenol-I have told her that she will probably need to self manage this pain after my discussion with her mother  Neuroendocrine tumor:Newly diagnosed well-differentiated neuroendocrine tumorand adrenal area, underwent a repeat EGD 5/27 with removal of the polyp continue treatment as #1.  Type 2 diabetes mellitus: Recent hemoglobin A1c of 8.4. We will resume her home dose of Lantus 45 units on discharge  AKI on CKD stage IIIa:Baseline creatinine 1.3. Creatinine creeped up to the level of 2.1.Improved with IV fluids--down to1.25 Torsemide has been held on discharge  Suspected sepsis: Sepsis ruled out. Antibiotics discontinued. She was tachypneic, tachycardic with lactic acidosis on presentation. Elevated white cell count on presentation. Lactate is normalized. Procalcitonin negative.  Chronic systolic CHF: Continue input/output monitoring, daily weight.EuvolemicCurrently on Toprol, hydralazine. Echo showed ejection fraction 45 to 50%, global hypokinesis, moderately elevated pulmonary artery systolic pressure. The findings are  similar to the previous echo on 11/20.-No further work-up  Hypertension: Currently blood pressure stable.Continue  current medications  Paroxysmal A. fib:On Eliquis which has been resumed on Toprol for rate control.   Hypertension: BP is well controlled on Imdur and prn hydalazine  History of nonhemorrhagic CVA: Continue current medications.Lipitor LDL 37, resume Eliquis  GERD:continue PPI  Chronic anxiety/depression:continue Cymbalta  Hyperlipidemia:On statin at home.  Morbid obesity: BMI 51.6.  Hypokalemia:Supplemented with potassium  Hypomagnesemia:Improved  Normocytic anemia: Hemoglobindropped to7.1 at one point .Iron level low normal. No evidence of acute blood loss. Could be associated with her CKD. Transfused with aunit of PRBC during this hospitalization. Hemoglobin increased to 8.2 on discharge  Tardive dyskinesia: Has involuntary movement of her tongue and mouth consistent with tardive dyskinesia. Could be associated with Reglan that she was using in the past?-  Procedures: -Duodenal bulb polyp biopsy revealed carcinoid tumor.  Underwent repeat EGD 05/27  with removal of duodenal polyp with hot snare.  Procedure was somewhat difficult because of the location as well as patient's body habitus.  One clip was placed. -Nausea and vomiting. -Abdominal pain.   Iron deficiency anemia. Colonoscopy yesterday negative for active bleeding. Had a fair prep. 2 diminutive polyps removed with biopsy forcep. One 10 mm polyp removed with hot snare.  Pathology revealed tubular adenomas. -Gastroparesis. -History of atrial fibrillation. Eliquis on hold  -History of CHF    Consultations:  GI Dr. Rosalie Gums  Discharge Exam: Vitals:   08/07/19 0600 08/07/19 1300  BP:  127/76  Pulse:  77  Resp: 15 20  Temp:  97.7 F (36.5 C)  SpO2:  98%    General: Awake alert coherent making sense Tells me that she vomited several times today however no objective  evidence that there is any vomit-she does not seem to be in any distress and tolerated a full meal at the bedside Cardiovascular: S1-S2 no murmur rub or gallop seems to be in sinus Respiratory: Clear no added sound Abdomen is protuberant nontender no rebound no guarding  Discharge Instructions   Discharge Instructions    Diet - low sodium heart healthy   Complete by: As directed    Discharge instructions   Complete by: As directed    DO NOT take [place] as this will worsen your gastroparesis You will need follow-up with a gastroenterologist in the outpatient setting I would STRONGLY recommend that you continue the erythromycin 4 times a day We will ask the case manager to help you find a primary care physician   Increase activity slowly   Complete by: As directed      Allergies as of 08/07/2019      Reactions   Contrast Media [iodinated Diagnostic Agents] Anaphylaxis   Cardiac arrest   Diazepam [name]   Isovue [iopamidol] Anaphylaxis   Patient had seizure like activity and then code post 100 cc of isovue 300   Lisinopril Anaphylaxis   Tongue and mouth swelling   Penicillins Palpitations   Has patient had a PCN reaction causing immediate rash, facial/tongue/throat swelling, SOB or lightheadedness with hypotension: Yes, heart races Has patient had a PCN reaction causing severe rash involving mucus membranes or skin necrosis: No Has patient had a PCN reaction that required hospitalization: Yes  Has patient had a PCN reaction occurring within the last 10 years: No   Acetaminophen Nausea Only, Other (See Comments)   Irritates stomach ulcer  Abdominal pain   Tolmetin Nausea Only, Other (See Comments)   ULCER   Aspirin Other (See Comments)   Irritates stomach ulcer    Nsaids  Other (See Comments)   ULCER   Tramadol Nausea And Vomiting      Medication List    STOP taking these medications   ALPRAZolam 1 MG tablet Commonly known as: XANAX   blood  glucose meter kit and supplies Kit   cetirizine 10 MG tablet Commonly known as: ZYRTEC   dicyclomine 20 MG tablet Commonly known as: BENTYL   fluticasone 50 MCG/ACT nasal spray Commonly known as: FLONASE   loperamide 2 MG capsule Commonly known as: IMODIUM   melatonin 3 MG Tabs tablet   ondansetron 4 MG disintegrating tablet Commonly known as: ZOFRAN-ODT   torsemide 20 MG tablet Commonly known as: DEMADEX     TAKE these medications   albuterol (2.5 MG/3ML) 0.083% nebulizer solution Commonly known as: PROVENTIL Take 3 mLs (2.5 mg total) by nebulization every 6 (six) hours as needed for wheezing or shortness of breath.   atorvastatin 10 MG tablet Commonly known as: LIPITOR Take 1 tablet (10 mg total) by mouth daily.   DULoxetine HCl 40 MG Cpep TAKE ONE CAPSULE BY MOUTH TWICE DAILY What changed:   how much to take  when to take this   Eliquis 5 MG Tabs tablet Generic drug: apixaban TAKE 1 TABLET (5 MG TOTAL) BY MOUTH 2 (TWO) TIMES DAILY. What changed: See the new instructions.   erythromycin 250 MG tablet Commonly known as: E-MYCIN Take 1 tablet (250 mg total) by mouth 4 (four) times daily -  with meals and at bedtime for 7 days.   hydrALAZINE 25 MG tablet Commonly known as: APRESOLINE Take 1 tablet (25 mg total) by mouth 3 (three) times daily. For blood pressure   isosorbide mononitrate 60 MG 24 hr tablet Commonly known as: IMDUR Take 1 tablet (60 mg total) by mouth daily.   Lantus SoloStar 100 UNIT/ML Solostar Pen Generic drug: insulin glargine Inject 45 Units into the skin at bedtime.   magnesium oxide 400 (241.3 Mg) MG tablet Commonly known as: MAG-OX Take 1 tablet (400 mg total) by mouth 2 (two) times daily.   metoprolol succinate 25 MG 24 hr tablet Commonly known as: TOPROL-XL Take 25 mg by mouth daily.   nitroGLYCERIN 0.4 MG SL tablet Commonly known as: NITROSTAT Place 1 tablet (0.4 mg total) under the tongue every 5 (five) minutes as needed  for chest pain.   NovoLOG FlexPen 100 UNIT/ML FlexPen Generic drug: insulin aspart Inject 15 Units into the skin 3 (three) times daily with meals. What changed: how much to take   pantoprazole 40 MG tablet Commonly known as: PROTONIX TAKE ONE TABLET BY MOUTH TWICE DAILY      Allergies  Allergen Reactions  . Contrast Media [Iodinated Diagnostic Agents] Anaphylaxis    Cardiac arrest  . Diazepam [name]  . Isovue [Iopamidol] Anaphylaxis    Patient had seizure like activity and then code post 100 cc of isovue 300  . Lisinopril Anaphylaxis    Tongue and mouth swelling  . Penicillins Palpitations    Has patient had a PCN reaction causing immediate rash, facial/tongue/throat swelling, SOB or lightheadedness with hypotension: Yes, heart races Has patient had a PCN reaction causing severe rash involving mucus membranes or skin necrosis: No Has patient had a PCN reaction that required hospitalization: Yes  Has patient had a PCN reaction occurring within the last 10 years: No   . Acetaminophen Nausea Only and Other (See Comments)    Irritates stomach ulcer  Abdominal pain  . Tolmetin Nausea Only and Other (  See Comments)    ULCER  . Aspirin Other (See Comments)    Irritates stomach ulcer   . Nsaids Other (See Comments)    ULCER  . Tramadol Nausea And Vomiting   Follow-up Information    Sueanne Margarita, MD Follow up on 07/31/2019.   Specialty: Cardiology Why: Please arrive 15 minutes early for your 9:20am post-hospital follow-up appointment.  Contact information: [address] [place] [address] [phone]            The results of significant diagnostics from this hospitalization (including imaging, microbiology, ancillary and laboratory) are listed below for reference.    Significant Diagnostic Studies: CT ABDOMEN PELVIS WO CONTRAST  Result Date: 07/22/2019 CLINICAL DATA:  Diffuse abdominal pain, nausea and vomiting. EXAM: CT ABDOMEN AND  PELVIS WITHOUT CONTRAST TECHNIQUE: Multidetector CT imaging of the abdomen and pelvis was performed following the standard protocol without IV contrast. COMPARISON:  CT abdomen dated 07/10/2019. FINDINGS: Lower chest: No acute abnormality. Hepatobiliary: No focal liver abnormality is seen. Status post cholecystectomy. No biliary dilatation. Pancreas: Unremarkable. No pancreatic ductal dilatation or surrounding inflammatory changes. Spleen: Normal in size without focal abnormality. Adrenals/Urinary Tract: Adrenal glands appear normal. Kidneys are unremarkable without mass, stone or hydronephrosis. No ureteral or bladder calculi. Bladder appears normal. Stomach/Bowel: No dilated large or small bowel loops. No evidence of bowel wall inflammation. Appendix is normal. Stomach is unremarkable. Vascular/Lymphatic: No significant vascular findings are present. No enlarged abdominal or pelvic lymph nodes. Reproductive: Uterine fibroid measures 5 cm. No acute findings. Again noted is some degree of pelvic floor laxity. Other: No free fluid or abscess collection. No free intraperitoneal air. Musculoskeletal: Degenerative spondylosis of the lower lumbar spine, mild to moderate in degree. No acute or suspicious osseous finding. IMPRESSION: 1. No acute findings within the abdomen or pelvis. No bowel obstruction or evidence of bowel wall inflammation. No evidence of acute solid organ abnormality. No renal or ureteral calculi. Appendix is normal. 2. Uterine fibroid measures 5 cm. 3. Degenerative spondylosis of the lower lumbar spine, mild to moderate in degree. Electronically Signed   By: Franki Cabot M.D.   On: 07/22/2019 09:14   CT ABDOMEN PELVIS WO CONTRAST  Result Date: 07/10/2019 CLINICAL DATA:  Nausea and vomiting for 2 hours EXAM: CT ABDOMEN AND PELVIS WITHOUT CONTRAST TECHNIQUE: Multidetector CT imaging of the abdomen and pelvis was performed following the standard protocol without IV contrast. COMPARISON:  06/10/2019  FINDINGS: Lower chest:  No contributory findings. Hepatobiliary: No focal liver abnormality.Cholecystectomy without bile duct dilatation. Pancreas: Unremarkable. Spleen: Unremarkable. Adrenals/Urinary Tract: Negative adrenals. No hydronephrosis or stone. Unremarkable bladder. Stomach/Bowel:  No obstruction. No appendicitis. Vascular/Lymphatic: No acute vascular abnormality. No mass or adenopathy. Reproductive:5 cm left adnexal mass is an exophytic fibroid by 2019 pelvic MRI. Pelvic floor laxity. Other: No ascites or pneumoperitoneum. Musculoskeletal: No acute abnormalities. Spondylosis and advanced lower lumbar facet osteoarthritis. Biforaminal impingement at L4-5 where there is jild anterolisthesis. IMPRESSION: 1. No acute finding including bowel obstruction or visible inflammation. 2. Chronic findings are stable from CT 1 month ago and described above. Electronically Signed   By: Monte Fantasia M.D.   On: 07/10/2019 04:55   CT ABDOMEN WO CONTRAST  Result Date: 08/03/2019 CLINICAL DATA:  54 year old female with abdominal pain. Status post recent endoscopy. EXAM: CT ABDOMEN WITHOUT CONTRAST TECHNIQUE: Multidetector CT imaging of the abdomen was performed following the standard protocol without IV contrast. COMPARISON:  CT abdomen pelvis dated 07/22/2019. FINDINGS: Evaluation of this exam  is limited in the absence of intravenous contrast. Lower chest: There is diffuse interstitial and interlobular septal prominence which may represent mild edema. Clinical correlation is recommended. No free air or free fluid identified in the visualized upper abdomen. Hepatobiliary: Fatty infiltration of the liver. No intrahepatic biliary ductal dilatation. Cholecystectomy. Pancreas: Unremarkable. No pancreatic ductal dilatation or surrounding inflammatory changes. Spleen: Normal in size without focal abnormality. Adrenals/Urinary Tract: The adrenal glands and kidneys are unremarkable. Stomach/Bowel: There is a 15 mm linear  radiopaque metallic density in the region of the duodenal bulb, likely an ingested foreign object. No bowel obstruction or active inflammation in the visualized abdomen. Vascular/Lymphatic: Mild atherosclerotic calcification of the aorta. No adenopathy. Other: None Musculoskeletal: Mild degenerative changes of the spine. No acute osseous pathology. IMPRESSION: 1. A 15 mm linear radiopaque metallic density in the region of the duodenal bulb, likely an ingested foreign object. No bowel obstruction or active inflammation in the visualized abdomen. No evidence of perforation. 2. Fatty liver. 3. Aortic Atherosclerosis (ICD10-I70.0). Electronically Signed   By: Anner Crete M.D.   On: 08/03/2019 15:30   DG Chest 2 View  Result Date: 07/21/2019 CLINICAL DATA:  Chest pain EXAM: CHEST - 2 VIEW COMPARISON:  07/02/2019 FINDINGS: Mild cardiomegaly. No focal opacity or pleural effusion. No pneumothorax. IMPRESSION: Mild cardiomegaly. No edema or infiltrate. Electronically Signed   By: Donavan Foil M.D.   On: 07/21/2019 20:19   NM GASTRIC EMPTYING  Result Date: 07/26/2019 CLINICAL DATA:  Nausea and vomiting EXAM: NUCLEAR MEDICINE GASTRIC EMPTYING SCAN TECHNIQUE: After oral ingestion of radiolabeled meal, sequential abdominal images were obtained for 4 hours. Percentage of activity emptying the stomach was calculated at 1 hour, 2 hour, 3 hour, and 4 hours. RADIOPHARMACEUTICALS:  1.9 mCi Tc-39msulfur colloid in standardized meal COMPARISON:  CT abdomen pelvis 07/22/2019 FINDINGS: Expected location of the stomach in the left upper quadrant. Ingested meal empties the stomach gradually over the course of the study. 1.5% emptied at 1 hr ( normal >= 10%) 6.3% emptied at 2 hr ( normal >= 40%) 16% emptied at 3 hr ( normal >= 70%) 46% emptied at 4 hr ( normal >= 90%) IMPRESSION: Delayed gastric emptying study. Electronically Signed   By: NDavina PokeD.O.   On: 07/26/2019 13:53   EEG adult  Result Date:  07/17/2019 YLora Havens MD     07/17/2019  2:13 PM Patient Name: CLESHIA KOPEMRN: [id number] Attending: PLora HavensReferring Physician/Provider: Dr. OAnson FretDate: 07/17/2019 Duration: 25.26 minutes Patient history: 54year old female with tremor-like movements.  EEG to evaluate for seizures. Level of alertness: Awake, asleep AEDs during EEG study: Xanax Technical aspects: This EEG study was done with scalp electrodes positioned according to the 10-20 International system of electrode placement. Electrical activity was acquired at a sampling rate of _0  and reviewed with a high frequency filter of _1  and a low frequency filter of _2 . EEG data were recorded continuously and digitally stored. Description: The posterior dominant rhythm consists of 8-9 Hz activity of moderate voltage (25-35 uV) seen predominantly in posterior head regions, symmetric and reactive to eye opening and eye closing.  Sleep was characterized by vertex waves, sleep spindles (12 to 14 Hz), maximal frontocentral region.  Physiologic photic driving was seen during photic stimulation.  Hyperventilation was not performed.        IMPRESSION: This study is within normal limits. No seizures or epileptiform discharges were seen throughout the recording. Priyanka OBarbra Sarks  ECHOCARDIOGRAM COMPLETE  Result Date: 07/23/2019    ECHOCARDIOGRAM REPORT   Patient Name:   LARINDA HERTER Date of Exam: 07/23/2019 Medical Rec #:  [id number]            Height:       66.0 in Accession #:    [id number]           Weight:       320.1 lb Date of Birth:  [birth year]            BSA:          2.442 m Patient Age:    54 years             BP:           123/51 mmHg Patient Gender: F                    HR:           111 bpm. Exam Location:  Inpatient Procedure: 2D Echo, Cardiac Doppler and Color Doppler Indications:    Congestive Heart Failure 428.0 / I50.9  History:        Patient has prior history of Echocardiogram examinations,  most                 recent 01/26/2019. CHF, Stroke and COPD, Arrythmias:Cardiac                 Arrest; Risk Factors:Hypertension, Diabetes and Non-Smoker.                 GERD. Elevated troponin.  Sonographer:    Vickie Epley RDCS Referring Phys: [id number] [place]  1. Left ventricular ejection fraction, by estimation, is 45 to 50%. The left ventricle has mildly decreased function. The left ventricle demonstrates global hypokinesis. Left ventricular diastolic function could not be evaluated.  2. Right ventricular systolic function is normal. The right ventricular size is normal. There is moderately elevated pulmonary artery systolic pressure.  3. The mitral valve is normal in structure. Trivial mitral valve regurgitation. No evidence of mitral stenosis.  4. The aortic valve is tricuspid. Aortic valve regurgitation is not visualized. Mild aortic valve sclerosis is present, with no evidence of aortic valve stenosis.  5. The inferior vena cava is normal in size with greater than 50% respiratory variability, suggesting right atrial pressure of 3 mmHg. FINDINGS  Left Ventricle: Left ventricular ejection fraction, by estimation, is 45 to 50%. The left ventricle has mildly decreased function. The left ventricle demonstrates global hypokinesis. The left ventricular internal cavity size was normal in size. There is  no left ventricular hypertrophy. Left ventricular diastolic function could not be evaluated. Right Ventricle: The right ventricular size is normal. No increase in right ventricular wall thickness. Right ventricular systolic function is normal. There is moderately elevated pulmonary artery systolic pressure. The tricuspid regurgitant velocity is 3.35 m/s, and with an assumed right atrial pressure of 3 mmHg, the estimated right ventricular systolic pressure is 27.0 mmHg. Left Atrium: Left atrial size was normal in size. Right Atrium: Right atrial size was normal in size. Pericardium: A small  pericardial effusion is present. Mitral Valve: The mitral valve is normal in structure. Trivial mitral valve regurgitation. No evidence of mitral valve stenosis. Tricuspid Valve: The tricuspid valve is normal in structure. Tricuspid valve regurgitation is trivial. No evidence of tricuspid stenosis. Aortic Valve: The aortic valve is tricuspid. Aortic valve regurgitation is not visualized. Mild aortic valve sclerosis is present,  with no evidence of aortic valve stenosis. There is mild calcification of the aortic valve. Pulmonic Valve: The pulmonic valve was not well visualized. Pulmonic valve regurgitation is not visualized. No evidence of pulmonic stenosis. Aorta: The aortic root, ascending aorta and aortic arch are all structurally normal, with no evidence of dilitation or obstruction. Venous: The inferior vena cava is normal in size with greater than 50% respiratory variability, suggesting right atrial pressure of 3 mmHg. IAS/Shunts: No atrial level shunt detected by color flow Doppler.  LEFT VENTRICLE PLAX 2D LVIDd:         5.50 cm LVIDs:         4.00 cm LV PW:         1.10 cm LV IVS:        1.10 cm LVOT diam:     2.30 cm LV SV:         74 LV SV Index:   30 LVOT Area:     4.15 cm  LV Volumes (MOD) LV vol d, MOD A2C: 175.0 ml LV vol d, MOD A4C: 173.0 ml LV vol s, MOD A2C: 86.2 ml LV vol s, MOD A4C: 91.2 ml LV SV MOD A2C:     88.8 ml LV SV MOD A4C:     173.0 ml LV SV MOD BP:      85.9 ml RIGHT VENTRICLE RV S prime:     14.70 cm/s TAPSE (M-mode): 2.3 cm LEFT ATRIUM             Index       RIGHT ATRIUM           Index LA diam:        4.60 cm 1.88 cm/m  RA Area:     12.00 cm LA Vol (A2C):   49.7 ml 20.35 ml/m RA Volume:   24.60 ml  10.07 ml/m LA Vol (A4C):   53.7 ml 21.99 ml/m LA Biplane Vol: 54.2 ml 22.19 ml/m  AORTIC VALVE LVOT Vmax:   102.00 cm/s LVOT Vmean:  70.100 cm/s LVOT VTI:    0.177 m  AORTA Ao Root diam: 3.40 cm MITRAL VALVE                TRICUSPID VALVE MV Area (PHT): 5.88 cm     TR Peak grad:    44.9 mmHg MV Decel Time: 129 msec     TR Vmax:        335.00 cm/s MR Peak grad: 87.2 mmHg MR Vmax:      467.00 cm/s   SHUNTS MV E velocity: 158.00 cm/s  Systemic VTI:  0.18 m                             Systemic Diam: 2.30 cm Buford Dresser MD Electronically signed by Buford Dresser MD Signature Date/Time: 07/23/2019/1:59:25 PM    Final     Microbiology: No results found for this or any previous visit (from the past 240 hour(s)).   Labs: Basic Metabolic Panel: Recent Labs  Lab 08/01/19 0130 08/02/19 0352 08/03/19 0322 08/04/19 0443 08/07/19 1046  NA 136 138 139 137 140  K 3.7 3.7 3.9 3.0* 3.8  CL 104 103 107 103 108  CO2 _0 GLUCOSE 247* 183* 220* 135* 157*  BUN 25* 24* 21* 18 30*  CREATININE 1.27* 1.05* 1.11* 1.25* 1.28*  CALCIUM 8.7* 8.6* 8.5* 8.2* 8.4*   Liver Function Tests: Recent  Labs  Lab 08/01/19 0130 08/02/19 0352 08/03/19 0322 08/07/19 1046  AST 9* 10* 10* 11*  ALT _0 ALKPHOS 92 87 92 88  BILITOT 0.4 0.7 0.7 0.7  PROT 6.1* 6.2* 6.1* 6.4*  ALBUMIN 2.7* 2.7* 2.7* 2.7*   No results for input(s): LIPASE, AMYLASE in the last 168 hours. No results for input(s): AMMONIA in the last 168 hours. CBC: Recent Labs  Lab 08/01/19 0130 08/02/19 0352 08/03/19 0322 08/04/19 0443 08/07/19 1046  WBC 11.3* 11.1* 13.5* 12.0* 10.4  NEUTROABS 8.0* 7.9* 11.1*  --  7.5  HGB 8.1* 8.1* 8.0* 7.7* 8.2*  HCT 26.6* 26.1* 26.2* 25.3* 27.9*  MCV 89.9 90.0 90.0 89.7 91.2  PLT 372 389 376 367 361   Cardiac Enzymes: No results for input(s): CKTOTAL, CKMB, CKMBINDEX, TROPONINI in the last 168 hours. BNP: BNP (last 3 results) Recent Labs    05/07/19 1451 06/22/19 2123 07/02/19 1327  BNP 127.1* 70.3 61.4    ProBNP (last 3 results) No results for input(s): PROBNP in the last 8760 hours.  CBG: Recent Labs  Lab 08/07/19 0025 08/07/19 0418 08/07/19 0731 08/07/19 1201 08/07/19 1619  GLUCAP 253* 214* 170* 163* 179*        Signed:  Nita Sells MD   [facility] 08/07/2019, 5:03 PM

## 2019-08-07 NOTE — Plan of Care (Signed)
  Problem: Education: Goal: Individualized Educational Video(s) Outcome: Progressing   Problem: Education: Goal: Knowledge of General Education information will improve Description: Including pain rating scale, medication(s)/side effects and non-pharmacologic comfort measures Outcome: Progressing

## 2019-08-07 NOTE — Progress Notes (Signed)
   Inpatient Diabetes Program Recommendations  AACE/ADA: New Consensus Statement on Inpatient Glycemic Control (2015)  Target Ranges:  Prepandial:   less than 140 mg/dL      Peak postprandial:   less than 180 mg/dL (1-2 hours)      Critically ill patients:  140 - 180 mg/dL   Lab Results  Component Value Date   GLUCAP 170 (H) 08/07/2019   HGBA1C 8.7 (H) 07/22/2019    Review of Glycemic Control Results for HAEDYN, BREAU (MRN [id number]) as of 08/07/2019 11:41  Ref. Range 08/06/2019 08:06 08/06/2019 11:56 08/06/2019 17:45 08/06/2019 20:54 08/07/2019 00:25 08/07/2019 04:18 08/07/2019 07:31  Glucose-Capillary Latest Ref Range: 70 - 99 mg/dL 163 (H) 195 (H) 327 (H) 222 (H) 253 (H) 214 (H) 170 (H)   DM2 Home medications: Lantus 45 units qhs, Novolog 30 units tid with meals Current inpatient Diabetes medications: Lantus 40 units Daily Novolog 0-20 units tid + hs Novolog 8 units tid meal coverage  Fasting glucose WNL, trends increase after meals still.  Inpatient Diabetes Program Recommendations:     Consider increasing meal coverage to Novolog to 12 units tidwc for meal coverage insulin  Thanks, Tama Headings RN, MSN, BC-ADM Inpatient Diabetes Coordinator Team Pager [phone] (8a-5p)

## 2019-08-07 NOTE — Chronic Care Management (AMB) (Signed)
  Care Management  Referral Note  08/07/2019 Name: Deborah [name] MRN: [id number] DOB: 1965/12/23  Referred by: PCP to assist patient with Personal care service referral. LCSW has not been able to process request due to Patient in the hospital.    plan: LCSW available to assist with [facility] referral once patient has been discharged.  [name], [place] / [place]   [phone] 11:43 AM

## 2019-08-07 NOTE — Care Management Important Message (Signed)
Important Message  Patient Details IM Letter given to Dessa Phi RN Case Manager to present to the Patient Name: Deborah [name] MRN: [id number] Date of Birth: 1966/02/23   Medicare Important Message Given:  Yes     Kerin Salen 08/07/2019, 12:02 PM

## 2019-08-08 DIAGNOSIS — I10 Essential (primary) hypertension: Secondary | ICD-10-CM

## 2019-08-08 DIAGNOSIS — D649 Anemia, unspecified: Secondary | ICD-10-CM

## 2019-08-08 DIAGNOSIS — R1010 Upper abdominal pain, unspecified: Secondary | ICD-10-CM

## 2019-08-08 DIAGNOSIS — I700 Atherosclerosis of aorta: Secondary | ICD-10-CM

## 2019-08-08 DIAGNOSIS — I2510 Atherosclerotic heart disease of native coronary artery without angina pectoris: Secondary | ICD-10-CM

## 2019-08-08 DIAGNOSIS — C7A010 Malignant carcinoid tumor of the duodenum: Secondary | ICD-10-CM

## 2019-08-08 DIAGNOSIS — E119 Type 2 diabetes mellitus without complications: Secondary | ICD-10-CM

## 2019-08-08 LAB — GLUCOSE, CAPILLARY
Glucose-Capillary: 147 mg/dL — ABNORMAL HIGH (ref 70–99)
Glucose-Capillary: 134 mg/dL — ABNORMAL HIGH (ref 70–99)
Glucose-Capillary: 177 mg/dL — ABNORMAL HIGH (ref 70–99)
Glucose-Capillary: 157 mg/dL — ABNORMAL HIGH (ref 70–99)

## 2019-08-08 NOTE — Progress Notes (Signed)
HEMATOLOGY/ONCOLOGY CONSULTATION NOTE  Date of Service: 08/08/2019  Patient Care Team: [name], DO as PCP - General (Family Medicine) Sueanne Margarita, MD as PCP - Cardiology (Cardiology) Ernest Haber, MD (Family Medicine)  CHIEF COMPLAINTS/PURPOSE OF CONSULTATION:  Duodenal Carcinoid  HISTORY OF PRESENTING ILLNESS:   Deborah [name] is a wonderful 54 y.o. female who has been referred to [place] by Dr [name] for evaluation and management of newly diagnosed Duodenal Carcinoid. The pt reports that she is doing well overall.   The pt reports that she is having abdominal pain and has not been able to have a bowel movement today, but she has continued eating well. Pt believes that her abdominal pain is due diabetic gastroparesis but claims that she has not spoken explicitly regarding this with her Gastroenterologist . She has had SOB, but feels that it could be caused by her CHF. Pt has previously had chronic diarrhea and notes that her Diabetes has not been well-controlled.   Of note prior to the patient's visit today, pt has had CT Abd ([id number]) completed on 08/03/2019 with results revealing "1. A 15 mm linear radiopaque metallic density in the region of the duodenal bulb, likely an ingested foreign object. No bowel obstruction or active inflammation in the visualized abdomen. No evidence of perforation. 2. Fatty liver. 3. Aortic Atherosclerosis (ICD10-I70.0)."  Pt has had Upper Endoscopy completed on 08/02/2019 with results revealing "- Z-line regular, 36 cm from the incisors. - Normal mucosa was found in the entire stomach. - Non-bleeding duodenal ulcer with no stigmata of bleeding. - A single duodenal polyp. Resected and retrieved. Clip was placed. - Normal second portion of the duodenum."   Pt has had Colonoscopy completed on 07/30/2019 with results revealing "- Preparation of the colon was fair. - The examined portion of the ileum was normal. - Two 2 to 3 mm polyps  in the descending colon. Biopsied. - One 10 mm polyp in the proximal sigmoid colon, removed with a hot snare. Resected and retrieved. - Internal hemorrhoids."  Pt has had Duodenal Polypectomy Surgical Pathology ([id number]) completed on 07/27/2019 with results revealing "DUODENAL BULB, POLYPECTOMY: - Well-differentiated neuroendocrine (carcinoid) tumor."  Pt has had Colonic Polypectomy Surgical Pathology ([id number]) completed on 07/30/2019 with results revealing "A. COLON, DESCENDING, POLYPECTOMY: - Tubular adenoma. - No high-grade dysplasia or carcinoma. B. COLON, SIGMOID, POLYPECTOMY: - Tubular adenoma. - No high-grade dysplasia or carcinoma."  Pt has had Duodenal Polypectomy ([id number]) completed on  08/02/2019 with results revealing "DUODENUM, POLYPECTOMY: - Low grade neuroendocrine tumor (carcinoid tumor), broadly present at multiple tissue edges. - Lymphovascular invasion is identified."  Most recent lab results (08/07/2019) of CBC w/diff and CMP is as follows: all values are WNL except for RBC at 3.06, Hgb at 8.2, HCT at 27.9, MCHC at 29.4, Glucose at 157, BUN at 30, Creatinine at 1.28, Calcium at 8.4, Total Protein at 6.4, Albumin at 2.7, AST at 11, GFR Est Af Am at 54.  On review of systems, pt reports SOB, abdominal pain and denies unexpected weight loss, chest pain, new bone pain, low appetite, rashes and any other symptoms.   On PMHx the pt reports Gastroparesis, Diabetes, HLD, HTN, Chronic systolic CHF.  MEDICAL HISTORY:  Past Medical History:  Diagnosis Date  . Acute back pain with sciatica, left   . Acute back pain with sciatica, right   . AKI (acute kidney injury) ([place])   . Anemia, unspecified   . Chronic pain   . Chronic  systolic CHF (congestive heart failure) ([place])   . Diabetes mellitus   . Esophageal reflux   . Fibromyalgia   . Gastric ulcer   . Gastroparesis   . Gout   . Hyperlipidemia   . Hypertension   . Lumbosacral stenosis   . NICM (nonischemic  cardiomyopathy) ([place])   . Obesity   . PAF (paroxysmal atrial fibrillation) ([place])   . Stroke ([place]) 02/2011  . Vitamin B12 deficiency anemia     SURGICAL HISTORY: Past Surgical History:  Procedure Laterality Date  . BIOPSY  07/27/2019   Procedure: BIOPSY;  Surgeon: Clarene Essex, MD;  Location: [facility];  Service: Endoscopy;;  . BIOPSY  07/30/2019   Procedure: BIOPSY;  Surgeon: Otis Brace, MD;  Location: [facility];  Service: Gastroenterology;;  . CATARACT EXTRACTION  01/2014  . CHOLECYSTECTOMY    . COLONOSCOPY WITH PROPOFOL N/A 07/30/2019   Procedure: COLONOSCOPY WITH PROPOFOL;  Surgeon: Otis Brace, MD;  Location: [facility];  Service: Gastroenterology;  Laterality: N/A;  . ESOPHAGOGASTRODUODENOSCOPY N/A 07/27/2019   Procedure: ESOPHAGOGASTRODUODENOSCOPY (EGD);  Surgeon: Clarene Essex, MD;  Location: [facility];  Service: Endoscopy;  Laterality: N/A;  . ESOPHAGOGASTRODUODENOSCOPY (EGD) WITH PROPOFOL N/A 08/02/2019   Procedure: ESOPHAGOGASTRODUODENOSCOPY (EGD) WITH PROPOFOL;  Surgeon: Otis Brace, MD;  Location: [facility];  Service: Gastroenterology;  Laterality: N/A;  . HEMOSTASIS CLIP PLACEMENT  08/02/2019   Procedure: HEMOSTASIS CLIP PLACEMENT;  Surgeon: Otis Brace, MD;  Location: [facility];  Service: Gastroenterology;;  . POLYPECTOMY  07/30/2019   Procedure: POLYPECTOMY;  Surgeon: Otis Brace, MD;  Location: [facility];  Service: Gastroenterology;;  . POLYPECTOMY  08/02/2019   Procedure: POLYPECTOMY;  Surgeon: Otis Brace, MD;  Location: [facility];  Service: Gastroenterology;;    SOCIAL HISTORY: Social History   Socioeconomic History  . Marital status: Single    Spouse name: Not on file  . Number of children: Not on file  . Years of education: Not on file  . Highest education level: Not on file  Occupational History  . Not on file  Tobacco Use  . Smoking status: Never Smoker  . Smokeless tobacco: Never Used  Substance and  Sexual Activity  . Alcohol use: No  . Drug use: No  . Sexual activity: Not Currently    Birth control/protection: None  Other Topics Concern  . Not on file  Social History Narrative   ** Merged History Encounter **       Social Determinants of Health   Financial Resource Strain:   . Difficulty of Paying Living Expenses:   Food Insecurity:   . Worried About Charity fundraiser in the Last Year:   . Arboriculturist in the Last Year:   Transportation Needs:   . Film/video editor (Medical):   [name] Lack of Transportation (Non-Medical):   Physical Activity:   . Days of Exercise per Week:   . Minutes of Exercise per Session:   Stress:   . Feeling of Stress :   Social Connections:   . Frequency of Communication with Friends and Family:   . Frequency of Social Gatherings with Friends and Family:   . Attends Religious Services:   . Active Member of Clubs or Organizations:   . Attends Archivist Meetings:   [name] Marital Status:   Intimate Partner Violence:   . Fear of Current or Ex-Partner:   . Emotionally Abused:   [name] Physically Abused:   . Sexually Abused:     FAMILY HISTORY: Family  History  Problem Relation Age of Onset  . Diabetes Mother   . Diabetes Father   . Heart disease Father   . Diabetes Sister   . Congestive Heart Failure Sister 70  . Diabetes Brother     ALLERGIES:  is allergic to contrast media [iodinated diagnostic agents]; diazepam; isovue [iopamidol]; lisinopril; penicillins; acetaminophen; tolmetin; aspirin; metoclopramide; nsaids; and tramadol.  MEDICATIONS:  Current Facility-Administered Medications  Medication Dose Route Frequency Provider Last Rate Last Admin  . albuterol (PROVENTIL) (2.5 MG/3ML) 0.083% nebulizer solution 2.5 mg  2.5 mg Nebulization Q6H PRN Clarene Essex, MD      . alum & mag hydroxide-simeth (MAALOX/MYLANTA) 200-200-20 MG/5ML suspension 30 mL  30 mL Oral Q6H PRN Clarene Essex, MD   30 mL at 07/23/19 0850  . apixaban (ELIQUIS)  tablet 5 mg  5 mg Oral BID Emiliano Dyer, RPH   5 mg at 08/08/19 1331  . atorvastatin (LIPITOR) tablet 10 mg  10 mg Oral Daily Irene Pap N, DO   10 mg at 08/08/19 1332  . dextrose 50 % solution 0-50 mL  0-50 mL Intravenous PRN Clarene Essex, MD      . DULoxetine (CYMBALTA) DR capsule 40 mg  40 mg Oral Daily Clarene Essex, MD   40 mg at 08/07/19 0952  . erythromycin (E-MYCIN) tablet 250 mg  250 mg Oral TID WC & HS Clarene Essex, MD   250 mg at 08/08/19 1331  . ferrous sulfate tablet 325 mg  325 mg Oral Q breakfast Irene Pap N, DO   325 mg at 08/08/19 6720  . hydrALAZINE (APRESOLINE) injection 10 mg  10 mg Intravenous Q6H PRN Clarene Essex, MD   10 mg at 07/23/19 0330  . hydrALAZINE (APRESOLINE) injection 5 mg  5 mg Intravenous Q6H Clarene Essex, MD   5 mg at 08/08/19 1234  . insulin aspart (novoLOG) injection 0-20 Units  0-20 Units Subcutaneous TID WC Irene Pap N, DO   3 Units at 08/08/19 1235  . insulin aspart (novoLOG) injection 0-5 Units  0-5 Units Subcutaneous QHS Kayleen Memos, DO   3 Units at 08/07/19 2255  . insulin aspart (novoLOG) injection 8 Units  8 Units Subcutaneous TID WC Donnamae Jude, MD   8 Units at 08/08/19 1235  . insulin glargine (LANTUS) injection 40 Units  40 Units Subcutaneous Daily Donnamae Jude, MD   40 Units at 08/08/19 1031  . isosorbide mononitrate (IMDUR) 24 hr tablet 60 mg  60 mg Oral Daily Clarene Essex, MD   60 mg at 08/08/19 1332  . LORazepam (ATIVAN) tablet 0.5 mg  0.5 mg Oral Q6H PRN Clarene Essex, MD   0.5 mg at 08/08/19 0936  . metoprolol succinate (TOPROL-XL) 24 hr tablet 50 mg  50 mg Oral Daily Clarene Essex, MD   50 mg at 08/08/19 1332  . metoprolol tartrate (LOPRESSOR) injection 5 mg  5 mg Intravenous Q6H PRN Clarene Essex, MD   5 mg at 07/23/19 0349  . nitroGLYCERIN (NITROSTAT) SL tablet 0.4 mg  0.4 mg Sublingual Q5 min PRN Clarene Essex, MD      . ondansetron [facility]) tablet 4 mg  4 mg Oral Q4H PRN Clarene Essex, MD   4 mg at 08/01/19 1801   Or  . ondansetron  (ZOFRAN) injection 4 mg  4 mg Intravenous Q6H PRN Clarene Essex, MD   4 mg at 08/08/19 1330  . pantoprazole (PROTONIX) injection 40 mg  40 mg Intravenous Q12H Otis Brace, MD  40 mg at 08/08/19 1031  . polyethylene glycol (MIRALAX / GLYCOLAX) packet 17 g  17 g Oral Daily PRN Irene Pap N, DO      . promethazine (PHENERGAN) injection 25 mg  25 mg Intravenous Q6H PRN Antonieta Pert, MD   25 mg at 08/03/19 1613  . sodium chloride flush (NS) 0.9 % injection 10-40 mL  10-40 mL Intracatheter PRN Clarene Essex, MD   10 mL at 08/08/19 0351    REVIEW OF SYSTEMS:    10 Point review of Systems was done is negative except as noted above.  PHYSICAL EXAMINATION: ECOG PERFORMANCE STATUS: 2 - Symptomatic, <50% confined to bed  . Vitals:   08/08/19 0033 08/08/19 0532  BP: (!) 150/86 129/73  Pulse: 90 91  Resp:  18  Temp:  99.1 F (37.3 C)  SpO2:  98%   Filed Weights   08/06/19 0926 08/07/19 0443 08/08/19 0602  Weight: (!) 340 lb 3.2 oz (154.3 kg) (!) 347 lb 3.6 oz (157.5 kg) (!) 348 lb 8.8 oz (158.1 kg)   .Body mass index is 56.26 kg/m.   Exam was given in a chair   GENERAL:alert, in no acute distress and comfortable SKIN: no acute rashes, no significant lesions EYES: conjunctiva are pink and non-injected, sclera anicteric OROPHARYNX: MMM, no exudates, no oropharyngeal erythema or ulceration NECK: supple, no JVD LYMPH:  no palpable lymphadenopathy in the cervical, axillary or inguinal regions LUNGS: clear to auscultation b/l with normal respiratory effort HEART: regular rate & rhythm ABDOMEN:  normoactive bowel sounds , non tender, not distended. Extremity: no pedal edema PSYCH: alert & oriented x 3 with fluent speech NEURO: no focal motor/sensory deficits  LABORATORY DATA:  I have reviewed the data as listed  . CBC Latest Ref Rng & Units 08/07/2019 08/04/2019 08/03/2019  WBC 4.0 - 10.5 K/uL 10.4 12.0(H) 13.5(H)  Hemoglobin 12.0 - 15.0 g/dL 8.2(L) 7.7(L) 8.0(L)  Hematocrit 36.0 -  46.0 % 27.9(L) 25.3(L) 26.2(L)  Platelets 150 - 400 K/uL 361 367 376    . CMP Latest Ref Rng & Units 08/07/2019 08/04/2019 08/03/2019  Glucose 70 - 99 mg/dL 157(H) 135(H) 220(H)  BUN 6 - 20 mg/dL 30(H) 18 21(H)  Creatinine 0.44 - 1.00 mg/dL 1.28(H) 1.25(H) 1.11(H)  Sodium 135 - 145 mmol/L 140 137 139  Potassium 3.5 - 5.1 mmol/L 3.8 3.0(L) 3.9  Chloride 98 - 111 mmol/L 108 103 107  CO2 22 - 32 mmol/L 23 25 24   Calcium 8.9 - 10.3 mg/dL 8.4(L) 8.2(L) 8.5(L)  Total Protein 6.5 - 8.1 g/dL 6.4(L) - 6.1(L)  Total Bilirubin 0.3 - 1.2 mg/dL 0.7 - 0.7  Alkaline Phos 38 - 126 U/L 88 - 92  AST 15 - 41 U/L 11(L) - 10(L)  ALT 0 - 44 U/L 10 - 10          RADIOGRAPHIC STUDIES: I have personally reviewed the radiological images as listed and agreed with the findings in the report. CT ABDOMEN PELVIS WO CONTRAST  Result Date: 07/22/2019 CLINICAL DATA:  Diffuse abdominal pain, nausea and vomiting. EXAM: CT ABDOMEN AND PELVIS WITHOUT CONTRAST TECHNIQUE: Multidetector CT imaging of the abdomen and pelvis was performed following the standard protocol without IV contrast. COMPARISON:  CT abdomen dated 07/10/2019. FINDINGS: Lower chest: No acute abnormality. Hepatobiliary: No focal liver abnormality is seen. Status post cholecystectomy. No biliary dilatation. Pancreas: Unremarkable. No pancreatic ductal dilatation or surrounding inflammatory changes. Spleen: Normal in size without focal abnormality. Adrenals/Urinary Tract: Adrenal glands appear normal. Kidneys  are unremarkable without mass, stone or hydronephrosis. No ureteral or bladder calculi. Bladder appears normal. Stomach/Bowel: No dilated large or small bowel loops. No evidence of bowel wall inflammation. Appendix is normal. Stomach is unremarkable. Vascular/Lymphatic: No significant vascular findings are present. No enlarged abdominal or pelvic lymph nodes. Reproductive: Uterine fibroid measures 5 cm. No acute findings. Again noted is some degree of pelvic  floor laxity. Other: No free fluid or abscess collection. No free intraperitoneal air. Musculoskeletal: Degenerative spondylosis of the lower lumbar spine, mild to moderate in degree. No acute or suspicious osseous finding. IMPRESSION: 1. No acute findings within the abdomen or pelvis. No bowel obstruction or evidence of bowel wall inflammation. No evidence of acute solid organ abnormality. No renal or ureteral calculi. Appendix is normal. 2. Uterine fibroid measures 5 cm. 3. Degenerative spondylosis of the lower lumbar spine, mild to moderate in degree. Electronically Signed   By: Franki Cabot M.D.   On: 07/22/2019 09:14   CT ABDOMEN PELVIS WO CONTRAST  Result Date: 07/10/2019 CLINICAL DATA:  Nausea and vomiting for 2 hours EXAM: CT ABDOMEN AND PELVIS WITHOUT CONTRAST TECHNIQUE: Multidetector CT imaging of the abdomen and pelvis was performed following the standard protocol without IV contrast. COMPARISON:  06/10/2019 FINDINGS: Lower chest:  No contributory findings. Hepatobiliary: No focal liver abnormality.Cholecystectomy without bile duct dilatation. Pancreas: Unremarkable. Spleen: Unremarkable. Adrenals/Urinary Tract: Negative adrenals. No hydronephrosis or stone. Unremarkable bladder. Stomach/Bowel:  No obstruction. No appendicitis. Vascular/Lymphatic: No acute vascular abnormality. No mass or adenopathy. Reproductive:5 cm left adnexal mass is an exophytic fibroid by 2019 pelvic MRI. Pelvic floor laxity. Other: No ascites or pneumoperitoneum. Musculoskeletal: No acute abnormalities. Spondylosis and advanced lower lumbar facet osteoarthritis. Biforaminal impingement at L4-5 where there is jild anterolisthesis. IMPRESSION: 1. No acute finding including bowel obstruction or visible inflammation. 2. Chronic findings are stable from CT 1 month ago and described above. Electronically Signed   By: Monte Fantasia M.D.   On: 07/10/2019 04:55   CT ABDOMEN WO CONTRAST  Result Date: 08/03/2019 CLINICAL DATA:   54 year old female with abdominal pain. Status post recent endoscopy. EXAM: CT ABDOMEN WITHOUT CONTRAST TECHNIQUE: Multidetector CT imaging of the abdomen was performed following the standard protocol without IV contrast. COMPARISON:  CT abdomen pelvis dated 07/22/2019. FINDINGS: Evaluation of this exam is limited in the absence of intravenous contrast. Lower chest: There is diffuse interstitial and interlobular septal prominence which may represent mild edema. Clinical correlation is recommended. No free air or free fluid identified in the visualized upper abdomen. Hepatobiliary: Fatty infiltration of the liver. No intrahepatic biliary ductal dilatation. Cholecystectomy. Pancreas: Unremarkable. No pancreatic ductal dilatation or surrounding inflammatory changes. Spleen: Normal in size without focal abnormality. Adrenals/Urinary Tract: The adrenal glands and kidneys are unremarkable. Stomach/Bowel: There is a 15 mm linear radiopaque metallic density in the region of the duodenal bulb, likely an ingested foreign object. No bowel obstruction or active inflammation in the visualized abdomen. Vascular/Lymphatic: Mild atherosclerotic calcification of the aorta. No adenopathy. Other: None Musculoskeletal: Mild degenerative changes of the spine. No acute osseous pathology. IMPRESSION: 1. A 15 mm linear radiopaque metallic density in the region of the duodenal bulb, likely an ingested foreign object. No bowel obstruction or active inflammation in the visualized abdomen. No evidence of perforation. 2. Fatty liver. 3. Aortic Atherosclerosis (ICD10-I70.0). Electronically Signed   By: Anner Crete M.D.   On: 08/03/2019 15:30   DG Chest 2 View  Result Date: 07/21/2019 CLINICAL DATA:  Chest pain EXAM: CHEST - 2 VIEW  COMPARISON:  07/02/2019 FINDINGS: Mild cardiomegaly. No focal opacity or pleural effusion. No pneumothorax. IMPRESSION: Mild cardiomegaly. No edema or infiltrate. Electronically Signed   By: Donavan Foil M.D.    On: 07/21/2019 20:19   NM GASTRIC EMPTYING  Result Date: 07/26/2019 CLINICAL DATA:  Nausea and vomiting EXAM: NUCLEAR MEDICINE GASTRIC EMPTYING SCAN TECHNIQUE: After oral ingestion of radiolabeled meal, sequential abdominal images were obtained for 4 hours. Percentage of activity emptying the stomach was calculated at 1 hour, 2 hour, 3 hour, and 4 hours. RADIOPHARMACEUTICALS:  1.9 mCi Tc-39m sulfur colloid in standardized meal COMPARISON:  CT abdomen pelvis 07/22/2019 FINDINGS: Expected location of the stomach in the left upper quadrant. Ingested meal empties the stomach gradually over the course of the study. 1.5% emptied at 1 hr ( normal >= 10%) 6.3% emptied at 2 hr ( normal >= 40%) 16% emptied at 3 hr ( normal >= 70%) 46% emptied at 4 hr ( normal >= 90%) IMPRESSION: Delayed gastric emptying study. Electronically Signed   By: Davina Poke D.O.   On: 07/26/2019 13:53   EEG adult  Result Date: 07/17/2019 Lora Havens, MD     07/17/2019  2:13 PM Patient Name: RASEEL JANS MRN: [id number] Epilepsy Attending: Lora Havens Referring Physician/Provider: Dr. Anson Fret Date: 07/17/2019 Duration: 25.26 minutes Patient history: 54 year old female with tremor-like movements.  EEG to evaluate for seizures. Level of alertness: Awake, asleep AEDs during EEG study: Xanax Technical aspects: This EEG study was done with scalp electrodes positioned according to the 10-20 International system of electrode placement. Electrical activity was acquired at a sampling rate of 500Hz  and reviewed with a high frequency filter of 70Hz  and a low frequency filter of 1Hz . EEG data were recorded continuously and digitally stored. Description: The posterior dominant rhythm consists of 8-9 Hz activity of moderate voltage (25-35 uV) seen predominantly in posterior head regions, symmetric and reactive to eye opening and eye closing.  Sleep was characterized by vertex waves, sleep spindles (12 to 14 Hz), maximal  frontocentral region.  Physiologic photic driving was seen during photic stimulation.  Hyperventilation was not performed.        IMPRESSION: This study is within normal limits. No seizures or epileptiform discharges were seen throughout the recording. Lora Havens   ECHOCARDIOGRAM COMPLETE  Result Date: 07/23/2019    ECHOCARDIOGRAM REPORT   Patient Name:   CATHELEEN LANGHORNE Date of Exam: 07/23/2019 Medical Rec #:  [id number]            Height:       66.0 in Accession #:    [id number]           Weight:       320.1 lb Date of Birth:  1965/08/30            BSA:          2.442 m Patient Age:    89 years             BP:           123/51 mmHg Patient Gender: F                    HR:           111 bpm. Exam Location:  Inpatient Procedure: 2D Echo, Cardiac Doppler and Color Doppler Indications:    Congestive Heart Failure 428.0 / I50.9  History:        Patient has prior history of Echocardiogram  examinations, most                 recent 01/26/2019. CHF, Stroke and COPD, Arrythmias:Cardiac                 Arrest; Risk Factors:Hypertension, Diabetes and Non-Smoker.                 GERD. Elevated troponin.  Sonographer:    Vickie Epley RDCS Referring Phys: [id number] [place]  1. Left ventricular ejection fraction, by estimation, is 45 to 50%. The left ventricle has mildly decreased function. The left ventricle demonstrates global hypokinesis. Left ventricular diastolic function could not be evaluated.  2. Right ventricular systolic function is normal. The right ventricular size is normal. There is moderately elevated pulmonary artery systolic pressure.  3. The mitral valve is normal in structure. Trivial mitral valve regurgitation. No evidence of mitral stenosis.  4. The aortic valve is tricuspid. Aortic valve regurgitation is not visualized. Mild aortic valve sclerosis is present, with no evidence of aortic valve stenosis.  5. The inferior vena cava is normal in size with greater than 50% respiratory  variability, suggesting right atrial pressure of 3 mmHg. FINDINGS  Left Ventricle: Left ventricular ejection fraction, by estimation, is 45 to 50%. The left ventricle has mildly decreased function. The left ventricle demonstrates global hypokinesis. The left ventricular internal cavity size was normal in size. There is  no left ventricular hypertrophy. Left ventricular diastolic function could not be evaluated. Right Ventricle: The right ventricular size is normal. No increase in right ventricular wall thickness. Right ventricular systolic function is normal. There is moderately elevated pulmonary artery systolic pressure. The tricuspid regurgitant velocity is 3.35 m/s, and with an assumed right atrial pressure of 3 mmHg, the estimated right ventricular systolic pressure is 21.1 mmHg. Left Atrium: Left atrial size was normal in size. Right Atrium: Right atrial size was normal in size. Pericardium: A small pericardial effusion is present. Mitral Valve: The mitral valve is normal in structure. Trivial mitral valve regurgitation. No evidence of mitral valve stenosis. Tricuspid Valve: The tricuspid valve is normal in structure. Tricuspid valve regurgitation is trivial. No evidence of tricuspid stenosis. Aortic Valve: The aortic valve is tricuspid. Aortic valve regurgitation is not visualized. Mild aortic valve sclerosis is present, with no evidence of aortic valve stenosis. There is mild calcification of the aortic valve. Pulmonic Valve: The pulmonic valve was not well visualized. Pulmonic valve regurgitation is not visualized. No evidence of pulmonic stenosis. Aorta: The aortic root, ascending aorta and aortic arch are all structurally normal, with no evidence of dilitation or obstruction. Venous: The inferior vena cava is normal in size with greater than 50% respiratory variability, suggesting right atrial pressure of 3 mmHg. IAS/Shunts: No atrial level shunt detected by color flow Doppler.  LEFT VENTRICLE PLAX 2D  LVIDd:         5.50 cm LVIDs:         4.00 cm LV PW:         1.10 cm LV IVS:        1.10 cm LVOT diam:     2.30 cm LV SV:         74 LV SV Index:   30 LVOT Area:     4.15 cm  LV Volumes (MOD) LV vol d, MOD A2C: 175.0 ml LV vol d, MOD A4C: 173.0 ml LV vol s, MOD A2C: 86.2 ml LV vol s, MOD A4C: 91.2 ml LV SV MOD A2C:  88.8 ml LV SV MOD A4C:     173.0 ml LV SV MOD BP:      85.9 ml RIGHT VENTRICLE RV S prime:     14.70 cm/s TAPSE (M-mode): 2.3 cm LEFT ATRIUM             Index       RIGHT ATRIUM           Index LA diam:        4.60 cm 1.88 cm/m  RA Area:     12.00 cm LA Vol (A2C):   49.7 ml 20.35 ml/m RA Volume:   24.60 ml  10.07 ml/m LA Vol (A4C):   53.7 ml 21.99 ml/m LA Biplane Vol: 54.2 ml 22.19 ml/m  AORTIC VALVE LVOT Vmax:   102.00 cm/s LVOT Vmean:  70.100 cm/s LVOT VTI:    0.177 m  AORTA Ao Root diam: 3.40 cm MITRAL VALVE                TRICUSPID VALVE MV Area (PHT): 5.88 cm     TR Peak grad:   44.9 mmHg MV Decel Time: 129 msec     TR Vmax:        335.00 cm/s MR Peak grad: 87.2 mmHg MR Vmax:      467.00 cm/s   SHUNTS MV E velocity: 158.00 cm/s  Systemic VTI:  0.18 m                             Systemic Diam: 2.30 cm Buford Dresser MD Electronically signed by Buford Dresser MD Signature Date/Time: 07/23/2019/1:59:25 PM    Final     ASSESSMENT & PLAN:   54 year old African-American female with multiple medical comorbidities with  #1 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 Watt Climes. Repeat EGD was done on 08/02/2019-showed 1 nonbleeding superficial duodenal ulcer with no stigmata of bleeding in the duodenal bulb measuring 4 mm.  A single 12 mm sessile polyp with bleeding was noted in the duodenal bulb.  Polyp was removed with piecemeal technique using hot snare.  Hemostatic clip was placed. Pathology from 08/02/2019 confirmed low-grade carcinoid tumor with  lymphovascular invasion.  Tumor was broadly present with multiple tissue edges suggesting likely involvement of margins.  PLAN: -Discussed patient's most recent labs from 08/07/2019, all values are WNL except for RBC at 3.06, Hgb at 8.2, HCT at 27.9, MCHC at 29.4, Glucose at 157, BUN at 30, Creatinine at 1.28, Calcium at 8.4, Total Protein at 6.4, Albumin at 2.7, AST at 11, GFR Est Af Am at 54. -08/02/2019 Duodenal Polypectomy ([id number]) which revealed "DUODENUM, POLYPECTOMY: - Low grade neuroendocrine tumor (carcinoid tumor), broadly present at multiple tissue edges. - Lymphovascular invasion is identified." -Advised pt that her low-grade Duodenal Carcinoid typically is a low-grade cancer that typically tends to slow-growing but can spread to local lymph nodes and also metastasize. -We will have to determine if her duodenal carcinoid was just limited to the duodenum or if there is any signs of spread to the local lymph nodes or if it is more widely metastatic. -Her duodenal polyp that was resected does suggest involvement of the margins and lymphovascular invasion both of which are concerning findings from a perspective of risk of local recurrence and possible spread to local lymph nodes. -Would plan to get outpatient gallium dotatate PET CT scan for accurate staging of her  duodenal carcinoid tumor. -If no overt metastatic disease on gallium dotatate study will discussed with patient about considering surgical consultation for possible need for transduodenal local excision plus regional lymphadenectomy versus additional endoscopic resection versus endoscopic surveillance. -We will send out gastrin levels and other hormonal studies for biochemical evaluation upon clinic follow-up. -Will see in clinic upon discharge with labs   60 FOLLOW UP: Return to clinic with Dr. Irene Limbo for medical oncology follow-up in 2 weeks  All of the patients questions were answered with apparent satisfaction. The  patient knows to call the clinic with any problems, questions or concerns.  I spent 50 minutes counseling the patient face to face. The total time spent in the appointment was 80 minutes and more than 50% was on counseling and direct patient cares and coordination of care with hospital medicine team.    Sullivan Lone MD [place] AAHIVMS [facility] Hematology/Oncology Physician [facility]  (Office):       [phone] (Work cell):  [phone] (Fax):           [phone]  08/08/2019 1:42 PM  I, Yevette Edwards, am acting as a scribe for Dr. Sullivan Lone.   .I have reviewed the above documentation for accuracy and completeness, and I agree with the above. Sullivan Lone MD MS

## 2019-08-08 NOTE — Plan of Care (Signed)
  Problem: Education: Goal: Individualized Educational Video(s) Outcome: Progressing   Problem: Education: Goal: Knowledge of General Education information will improve Description: Including pain rating scale, medication(s)/side effects and non-pharmacologic comfort measures Outcome: Progressing

## 2019-08-08 NOTE — Progress Notes (Signed)
Pt only consumed lunch on this shift, along with juice throughout the day. Ate 90% of lunch, and tolerated well with no N/V. Pt has been resting in recliner the majority of the day. Will continue to monitor pt.

## 2019-08-08 NOTE — Progress Notes (Addendum)
[facility]                                                                              Patient Demographics  Deborah [name], is a 54 y.o. female, DOB - Jul 28, 1965, QBH:[id number]  Admit date - 07/21/2019   Admitting Physician Allie Bossier, MD  Outpatient Primary MD for the patient is [name], DO  Outpatient specialists:   LOS - 17  days   Medical records reviewed and are as summarized below:    Chief Complaint  Patient presents with  . Chest Pain  . Emesis       Brief summary   54 year old female with history of depression, nonhemorrhagic CVA, chronic systolic CHF PAF on Eliquis, acute distress 3 8, hypertension, gout, GERD/gastric ulcer, anemia of chronic disease T2DM with recurrent admission for DKA/diabetic gastroparesis presented with nausea vomiting diffuse abdominal pain. Recently was admitted for gastroparesis earlier this month GI. Patient on presentation had tachycardia tachypnea and lactic acidosis was admitted, seen by GI. Underwent GES which was abnormal then EGD to rule out gastric outlet obstruction but no acute finding, underwent colonoscopy subsequently that showed 2 to 3 mm polyp in the descending colon which was biopsied,. Biopsy findings showed well-differentiated neuroendocrine tumor Patient underwent EGD with polypectomy 5/27. GI has signed off   Assessment & Plan    Chronic abdominal pain, gastroparesis with underlying history of uncontrolled diabetes Narcotic dependence behavior -Recurrent admissions for uncontrolled diabetes, abdominal pain, gastroparesis flare -On erythromycin 3 times a day, cannot take Reglan due to history of tardive dyskinesia -GI was consulted, underwent gastric emptying study, EGD and colonoscopy, Endo Clip placed.   -On Zofran, Phenergan, erythromycin, PPI twice daily -GI has signed off -Patient is currently tolerating diet, however reports that she is in 10/10 pain, has been vomiting in  the trash can (no documentation or witnessed by the staff) -I explained to the patient (with RN in the room), that opiate use is likely exacerbating her gastroparesis.  Patient was recommended to avoid narcotics and Reglan during the previous admission.  I have discontinued IV Dilaudid.  During the previous admission short course of Xanax worked well in the hospital and was given limited supply on discharge. -Patient reported to me that if she is not given narcotics on discharge, she will come right back to the ED for pain medications. She states that her PCP is not doing the pain management hence she just comes back to the hospital whenever she has the pain.  Patient is being followed by [facility] family medicine teaching service.  Patient had no recent follow-up appointments.  -Strongly recommended to follow-up with her PCP and outpatient gastroenterology, outpatient pain management.   Neuroendocrine tumor -Newly diagnosed well-differentiated neuroendocrine tumor, underwent EGD 5/27 with removal of the polyp -GI requested me to call oncology for consultation.  Will request oncology consult, Dr. Irene Limbo to assess any inpatient work-up needed versus outpatient management, await recommendations  Diabetes mellitus type 2, uncontrolled -Recent hemoglobin A1c of 8.4, continue Lantus upon discharge  Acute kidney injury on CKD stage IIIa Baseline creatinine 1.3, Creatinine trended up to 2.1 during hospitalization, improved  with IV fluids, down to 1.2 Torsemide held on discharge.  SIRS -On admission, tachypnea, tachycardia, lactic acidosis, elevated WBCs -Sepsis ruled out, antibiotics discontinued, lactate normalized, procalcitonin negative  Chronic systolic CHF -Currently stable, euvolemic, 2D echo showed EF 45 to 50%, global hypokinesis similar to previous echo in 11/20  Hypertension Currently stable  Paroxysmal atrial fibrillation On Eliquis, continue Toprol for rate control  History of  nonhemorrhagic CVA -Continue Lipitor, resumed Eliquis  GERD: continue PPI  Obesity Estimated body mass index is 56.26 kg/m as calculated from the following:   Height as of this encounter: 5\' 6"  (1.676 m).   Weight as of this encounter: 158.1 kg.  Counseled on diet and weight control  Code Status: Full code DVT Prophylaxis: Apixaban Family Communication: Discussed all imaging results, lab results, explained to the patient.  I offered to discuss with her mother however patient declined, stated that she does not live with her mother   Disposition Plan:     Status is: Inpatient  Remains inpatient appropriate because:Inpatient level of care appropriate due to severity of illness.  Will get PT evaluation, discontinued IV narcotics, observe for any ongoing vomiting, p.o. intake   Dispo: The patient is from: Home              Anticipated d/c is to: Home              Anticipated d/c date is: 1 day              Patient currently is medically stable to d/c.  DC summary was done by Dr. Verlon Au, no changes.  If patient is able to ambulate and tolerates diet, cleared to be discharged home     Time Spent in minutes   40 minutes  Procedures:  None  Consultants:   Gastroenterology  Antimicrobials:   Anti-infectives (From admission, onward)   Start     Dose/Rate Route Frequency Ordered Stop   08/07/19 0000  erythromycin (E-MYCIN) 250 MG tablet     250 mg Oral 3 times daily with meals & bedtime 08/07/19 1700 08/14/19 2359   08/03/19 1000  cefTRIAXone (ROCEPHIN) 2 g in sodium chloride 0.9 % 100 mL IVPB  Status:  Discontinued     2 g 200 mL/hr over 30 Minutes Intravenous Every 24 hours 08/03/19 0900 08/04/19 1338   08/03/19 1000  metroNIDAZOLE (FLAGYL) IVPB 500 mg  Status:  Discontinued     500 mg 100 mL/hr over 60 Minutes Intravenous Every 8 hours 08/03/19 0900 08/04/19 1338   07/26/19 1700  erythromycin (E-MYCIN) tablet 250 mg     250 mg Oral 3 times daily with meals & bedtime  07/26/19 1312     07/24/19 1700  erythromycin (E-MYCIN) tablet 250 mg  Status:  Discontinued     250 mg Oral 3 times daily with meals & bedtime 07/24/19 1205 07/25/19 0838   07/22/19 0800  erythromycin (E-MYCIN) tablet 250 mg  Status:  Discontinued     250 mg Oral 3 times daily with meals & bedtime 07/22/19 0723 07/23/19 1507   07/22/19 0800  ciprofloxacin (CIPRO) IVPB 400 mg  Status:  Discontinued     400 mg 200 mL/hr over 60 Minutes Intravenous Every 12 hours 07/22/19 0734 07/23/19 0833   07/22/19 0800  metroNIDAZOLE (FLAGYL) IVPB 500 mg  Status:  Discontinued     500 mg 100 mL/hr over 60 Minutes Intravenous Every 8 hours 07/22/19 0734 07/23/19 1916  Medications  Scheduled Meds: . apixaban  5 mg Oral BID  . atorvastatin  10 mg Oral Daily  . DULoxetine  40 mg Oral Daily  . erythromycin  250 mg Oral TID WC & HS  . ferrous sulfate  325 mg Oral Q breakfast  . hydrALAZINE  5 mg Intravenous Q6H  . insulin aspart  0-20 Units Subcutaneous TID WC  . insulin aspart  0-5 Units Subcutaneous QHS  . insulin aspart  8 Units Subcutaneous TID WC  . insulin glargine  40 Units Subcutaneous Daily  . isosorbide mononitrate  60 mg Oral Daily  . metoprolol succinate  50 mg Oral Daily  . pantoprazole (PROTONIX) IV  40 mg Intravenous Q12H   Continuous Infusions: PRN Meds:.albuterol, alum & mag hydroxide-simeth, dextrose, hydrALAZINE, LORazepam, metoprolol tartrate, nitroGLYCERIN, ondansetron **OR** ondansetron (ZOFRAN) IV, polyethylene glycol, promethazine, sodium chloride flush      Subjective:   Deborah [name] was seen and examined today.  Continues to complain of 10/10 pain, nausea or vomiting.  No fevers or chills  Objective:   Vitals:   08/07/19 2008 08/08/19 0033 08/08/19 0532 08/08/19 0602  BP: (!) 150/85 (!) 150/86 129/73   Pulse: 91 90 91   Resp: 20  18   Temp: 98.2 F (36.8 C)  99.1 F (37.3 C)   TempSrc: Oral  Oral   SpO2: 100%  98%   Weight:    (!) 158.1 kg   Height:        Intake/Output Summary (Last 24 hours) at 08/08/2019 1000 Last data filed at 08/08/2019 0351 Gross per 24 hour  Intake 830 ml  Output --  Net 830 ml     Wt Readings from Last 3 Encounters:  08/08/19 (!) 158.1 kg  07/19/19 (!) 151.1 kg  07/04/19 (!) 156.1 kg     Exam  General: Alert and oriented x 3, NAD  Cardiovascular: S1 S2 auscultated, no murmurs, RRR  Respiratory: Clear to auscultation bilaterally, no wheezing, rales or rhonchi  Gastrointestinal: Soft, nontender, nondistended, + bowel sounds  Ext: no pedal edema bilaterally  Neuro: Moving all 4 extremities  Musculoskeletal: No digital cyanosis, clubbing  Skin: No rashes  Psych: Normal affect and demeanor, alert and oriented x3    Data Reviewed:  I have personally reviewed following labs and imaging studies  Micro Results No results found for this or any previous visit (from the past 240 hour(s)).  Radiology Reports CT ABDOMEN PELVIS WO CONTRAST  Result Date: 07/22/2019 CLINICAL DATA:  Diffuse abdominal pain, nausea and vomiting. EXAM: CT ABDOMEN AND PELVIS WITHOUT CONTRAST TECHNIQUE: Multidetector CT imaging of the abdomen and pelvis was performed following the standard protocol without IV contrast. COMPARISON:  CT abdomen dated 07/10/2019. FINDINGS: Lower chest: No acute abnormality. Hepatobiliary: No focal liver abnormality is seen. Status post cholecystectomy. No biliary dilatation. Pancreas: Unremarkable. No pancreatic ductal dilatation or surrounding inflammatory changes. Spleen: Normal in size without focal abnormality. Adrenals/Urinary Tract: Adrenal glands appear normal. Kidneys are unremarkable without mass, stone or hydronephrosis. No ureteral or bladder calculi. Bladder appears normal. Stomach/Bowel: No dilated large or small bowel loops. No evidence of bowel wall inflammation. Appendix is normal. Stomach is unremarkable. Vascular/Lymphatic: No significant vascular findings are present. No  enlarged abdominal or pelvic lymph nodes. Reproductive: Uterine fibroid measures 5 cm. No acute findings. Again noted is some degree of pelvic floor laxity. Other: No free fluid or abscess collection. No free intraperitoneal air. Musculoskeletal: Degenerative spondylosis of the lower lumbar spine, mild to moderate in  degree. No acute or suspicious osseous finding. IMPRESSION: 1. No acute findings within the abdomen or pelvis. No bowel obstruction or evidence of bowel wall inflammation. No evidence of acute solid organ abnormality. No renal or ureteral calculi. Appendix is normal. 2. Uterine fibroid measures 5 cm. 3. Degenerative spondylosis of the lower lumbar spine, mild to moderate in degree. Electronically Signed   By: Franki Cabot M.D.   On: 07/22/2019 09:14   CT ABDOMEN PELVIS WO CONTRAST  Result Date: 07/10/2019 CLINICAL DATA:  Nausea and vomiting for 2 hours EXAM: CT ABDOMEN AND PELVIS WITHOUT CONTRAST TECHNIQUE: Multidetector CT imaging of the abdomen and pelvis was performed following the standard protocol without IV contrast. COMPARISON:  06/10/2019 FINDINGS: Lower chest:  No contributory findings. Hepatobiliary: No focal liver abnormality.Cholecystectomy without bile duct dilatation. Pancreas: Unremarkable. Spleen: Unremarkable. Adrenals/Urinary Tract: Negative adrenals. No hydronephrosis or stone. Unremarkable bladder. Stomach/Bowel:  No obstruction. No appendicitis. Vascular/Lymphatic: No acute vascular abnormality. No mass or adenopathy. Reproductive:5 cm left adnexal mass is an exophytic fibroid by 2019 pelvic MRI. Pelvic floor laxity. Other: No ascites or pneumoperitoneum. Musculoskeletal: No acute abnormalities. Spondylosis and advanced lower lumbar facet osteoarthritis. Biforaminal impingement at L4-5 where there is jild anterolisthesis. IMPRESSION: 1. No acute finding including bowel obstruction or visible inflammation. 2. Chronic findings are stable from CT 1 month ago and described above.  Electronically Signed   By: Monte Fantasia M.D.   On: 07/10/2019 04:55   CT ABDOMEN WO CONTRAST  Result Date: 08/03/2019 CLINICAL DATA:  54 year old female with abdominal pain. Status post recent endoscopy. EXAM: CT ABDOMEN WITHOUT CONTRAST TECHNIQUE: Multidetector CT imaging of the abdomen was performed following the standard protocol without IV contrast. COMPARISON:  CT abdomen pelvis dated 07/22/2019. FINDINGS: Evaluation of this exam is limited in the absence of intravenous contrast. Lower chest: There is diffuse interstitial and interlobular septal prominence which may represent mild edema. Clinical correlation is recommended. No free air or free fluid identified in the visualized upper abdomen. Hepatobiliary: Fatty infiltration of the liver. No intrahepatic biliary ductal dilatation. Cholecystectomy. Pancreas: Unremarkable. No pancreatic ductal dilatation or surrounding inflammatory changes. Spleen: Normal in size without focal abnormality. Adrenals/Urinary Tract: The adrenal glands and kidneys are unremarkable. Stomach/Bowel: There is a 15 mm linear radiopaque metallic density in the region of the duodenal bulb, likely an ingested foreign object. No bowel obstruction or active inflammation in the visualized abdomen. Vascular/Lymphatic: Mild atherosclerotic calcification of the aorta. No adenopathy. Other: None Musculoskeletal: Mild degenerative changes of the spine. No acute osseous pathology. IMPRESSION: 1. A 15 mm linear radiopaque metallic density in the region of the duodenal bulb, likely an ingested foreign object. No bowel obstruction or active inflammation in the visualized abdomen. No evidence of perforation. 2. Fatty liver. 3. Aortic Atherosclerosis (ICD10-I70.0). Electronically Signed   By: Anner Crete M.D.   On: 08/03/2019 15:30   DG Chest 2 View  Result Date: 07/21/2019 CLINICAL DATA:  Chest pain EXAM: CHEST - 2 VIEW COMPARISON:  07/02/2019 FINDINGS: Mild cardiomegaly. No focal  opacity or pleural effusion. No pneumothorax. IMPRESSION: Mild cardiomegaly. No edema or infiltrate. Electronically Signed   By: Donavan Foil M.D.   On: 07/21/2019 20:19   NM GASTRIC EMPTYING  Result Date: 07/26/2019 CLINICAL DATA:  Nausea and vomiting EXAM: NUCLEAR MEDICINE GASTRIC EMPTYING SCAN TECHNIQUE: After oral ingestion of radiolabeled meal, sequential abdominal images were obtained for 4 hours. Percentage of activity emptying the stomach was calculated at 1 hour, 2 hour, 3 hour, and 4 hours. RADIOPHARMACEUTICALS:  1.9 mCi Tc-8m sulfur colloid in standardized meal COMPARISON:  CT abdomen pelvis 07/22/2019 FINDINGS: Expected location of the stomach in the left upper quadrant. Ingested meal empties the stomach gradually over the course of the study. 1.5% emptied at 1 hr ( normal >= 10%) 6.3% emptied at 2 hr ( normal >= 40%) 16% emptied at 3 hr ( normal >= 70%) 46% emptied at 4 hr ( normal >= 90%) IMPRESSION: Delayed gastric emptying study. Electronically Signed   By: Davina Poke D.O.   On: 07/26/2019 13:53   EEG adult  Result Date: 07/17/2019 Lora Havens, MD     07/17/2019  2:13 PM Patient Name: ASLEY BASKERVILLE MRN: [id number] Epilepsy Attending: Lora Havens Referring Physician/Provider: Dr. Anson Fret Date: 07/17/2019 Duration: 25.26 minutes Patient history: 54 year old female with tremor-like movements.  EEG to evaluate for seizures. Level of alertness: Awake, asleep AEDs during EEG study: Xanax Technical aspects: This EEG study was done with scalp electrodes positioned according to the 10-20 International system of electrode placement. Electrical activity was acquired at a sampling rate of 500Hz  and reviewed with a high frequency filter of 70Hz  and a low frequency filter of 1Hz . EEG data were recorded continuously and digitally stored. Description: The posterior dominant rhythm consists of 8-9 Hz activity of moderate voltage (25-35 uV) seen predominantly in posterior head  regions, symmetric and reactive to eye opening and eye closing.  Sleep was characterized by vertex waves, sleep spindles (12 to 14 Hz), maximal frontocentral region.  Physiologic photic driving was seen during photic stimulation.  Hyperventilation was not performed.        IMPRESSION: This study is within normal limits. No seizures or epileptiform discharges were seen throughout the recording. Lora Havens   ECHOCARDIOGRAM COMPLETE  Result Date: 07/23/2019    ECHOCARDIOGRAM REPORT   Patient Name:   JOHANNAH ROZAS Date of Exam: 07/23/2019 Medical Rec #:  [id number]            Height:       66.0 in Accession #:    [id number]           Weight:       320.1 lb Date of Birth:  [birth year]            BSA:          2.442 m Patient Age:    70 years             BP:           123/51 mmHg Patient Gender: F                    HR:           111 bpm. Exam Location:  Inpatient Procedure: 2D Echo, Cardiac Doppler and Color Doppler Indications:    Congestive Heart Failure 428.0 / I50.9  History:        Patient has prior history of Echocardiogram examinations, most                 recent 01/26/2019. CHF, Stroke and COPD, Arrythmias:Cardiac                 Arrest; Risk Factors:Hypertension, Diabetes and Non-Smoker.                 GERD. Elevated troponin.  Sonographer:    Vickie Epley RDCS Referring Phys: [id number] [place]  1. Left ventricular ejection fraction, by estimation, is 45 to  50%. The left ventricle has mildly decreased function. The left ventricle demonstrates global hypokinesis. Left ventricular diastolic function could not be evaluated.  2. Right ventricular systolic function is normal. The right ventricular size is normal. There is moderately elevated pulmonary artery systolic pressure.  3. The mitral valve is normal in structure. Trivial mitral valve regurgitation. No evidence of mitral stenosis.  4. The aortic valve is tricuspid. Aortic valve regurgitation is not visualized. Mild aortic valve  sclerosis is present, with no evidence of aortic valve stenosis.  5. The inferior vena cava is normal in size with greater than 50% respiratory variability, suggesting right atrial pressure of 3 mmHg. FINDINGS  Left Ventricle: Left ventricular ejection fraction, by estimation, is 45 to 50%. The left ventricle has mildly decreased function. The left ventricle demonstrates global hypokinesis. The left ventricular internal cavity size was normal in size. There is  no left ventricular hypertrophy. Left ventricular diastolic function could not be evaluated. Right Ventricle: The right ventricular size is normal. No increase in right ventricular wall thickness. Right ventricular systolic function is normal. There is moderately elevated pulmonary artery systolic pressure. The tricuspid regurgitant velocity is 3.35 m/s, and with an assumed right atrial pressure of 3 mmHg, the estimated right ventricular systolic pressure is 03.4 mmHg. Left Atrium: Left atrial size was normal in size. Right Atrium: Right atrial size was normal in size. Pericardium: A small pericardial effusion is present. Mitral Valve: The mitral valve is normal in structure. Trivial mitral valve regurgitation. No evidence of mitral valve stenosis. Tricuspid Valve: The tricuspid valve is normal in structure. Tricuspid valve regurgitation is trivial. No evidence of tricuspid stenosis. Aortic Valve: The aortic valve is tricuspid. Aortic valve regurgitation is not visualized. Mild aortic valve sclerosis is present, with no evidence of aortic valve stenosis. There is mild calcification of the aortic valve. Pulmonic Valve: The pulmonic valve was not well visualized. Pulmonic valve regurgitation is not visualized. No evidence of pulmonic stenosis. Aorta: The aortic root, ascending aorta and aortic arch are all structurally normal, with no evidence of dilitation or obstruction. Venous: The inferior vena cava is normal in size with greater than 50% respiratory  variability, suggesting right atrial pressure of 3 mmHg. IAS/Shunts: No atrial level shunt detected by color flow Doppler.  LEFT VENTRICLE PLAX 2D LVIDd:         5.50 cm LVIDs:         4.00 cm LV PW:         1.10 cm LV IVS:        1.10 cm LVOT diam:     2.30 cm LV SV:         74 LV SV Index:   30 LVOT Area:     4.15 cm  LV Volumes (MOD) LV vol d, MOD A2C: 175.0 ml LV vol d, MOD A4C: 173.0 ml LV vol s, MOD A2C: 86.2 ml LV vol s, MOD A4C: 91.2 ml LV SV MOD A2C:     88.8 ml LV SV MOD A4C:     173.0 ml LV SV MOD BP:      85.9 ml RIGHT VENTRICLE RV S prime:     14.70 cm/s TAPSE (M-mode): 2.3 cm LEFT ATRIUM             Index       RIGHT ATRIUM           Index LA diam:        4.60 cm 1.88 cm/m  RA Area:  12.00 cm LA Vol (A2C):   49.7 ml 20.35 ml/m RA Volume:   24.60 ml  10.07 ml/m LA Vol (A4C):   53.7 ml 21.99 ml/m LA Biplane Vol: 54.2 ml 22.19 ml/m  AORTIC VALVE LVOT Vmax:   102.00 cm/s LVOT Vmean:  70.100 cm/s LVOT VTI:    0.177 m  AORTA Ao Root diam: 3.40 cm MITRAL VALVE                TRICUSPID VALVE MV Area (PHT): 5.88 cm     TR Peak grad:   44.9 mmHg MV Decel Time: 129 msec     TR Vmax:        335.00 cm/s MR Peak grad: 87.2 mmHg MR Vmax:      467.00 cm/s   SHUNTS MV E velocity: 158.00 cm/s  Systemic VTI:  0.18 m                             Systemic Diam: 2.30 cm Buford Dresser MD Electronically signed by Buford Dresser MD Signature Date/Time: 07/23/2019/1:59:25 PM    Final     Lab Data:  CBC: Recent Labs  Lab 08/02/19 0352 08/03/19 0322 08/04/19 0443 08/07/19 1046  WBC 11.1* 13.5* 12.0* 10.4  NEUTROABS 7.9* 11.1*  --  7.5  HGB 8.1* 8.0* 7.7* 8.2*  HCT 26.1* 26.2* 25.3* 27.9*  MCV 90.0 90.0 89.7 91.2  PLT 389 376 367 932   Basic Metabolic Panel: Recent Labs  Lab 08/02/19 0352 08/03/19 0322 08/04/19 0443 08/07/19 1046  NA 138 139 137 140  K 3.7 3.9 3.0* 3.8  CL 103 107 103 108  CO2 24 24 25 23   GLUCOSE 183* 220* 135* 157*  BUN 24* 21* 18 30*  CREATININE 1.05*  1.11* 1.25* 1.28*  CALCIUM 8.6* 8.5* 8.2* 8.4*   GFR: Estimated Creatinine Clearance: 77.5 mL/min (A) (by C-G formula based on SCr of 1.28 mg/dL (H)). Liver Function Tests: Recent Labs  Lab 08/02/19 0352 08/03/19 0322 08/07/19 1046  AST 10* 10* 11*  ALT 8 10 10   ALKPHOS 87 92 88  BILITOT 0.7 0.7 0.7  PROT 6.2* 6.1* 6.4*  ALBUMIN 2.7* 2.7* 2.7*   No results for input(s): LIPASE, AMYLASE in the last 168 hours. No results for input(s): AMMONIA in the last 168 hours. Coagulation Profile: No results for input(s): INR, PROTIME in the last 168 hours. Cardiac Enzymes: No results for input(s): CKTOTAL, CKMB, CKMBINDEX, TROPONINI in the last 168 hours. BNP (last 3 results) No results for input(s): PROBNP in the last 8760 hours. HbA1C: No results for input(s): HGBA1C in the last 72 hours. CBG: Recent Labs  Lab 08/07/19 0731 08/07/19 1201 08/07/19 1619 08/07/19 2242 08/08/19 0745  GLUCAP 170* 163* 179* 255* 147*   Lipid Profile: No results for input(s): CHOL, HDL, LDLCALC, TRIG, CHOLHDL, LDLDIRECT in the last 72 hours. Thyroid Function Tests: No results for input(s): TSH, T4TOTAL, FREET4, T3FREE, THYROIDAB in the last 72 hours. Anemia Panel: No results for input(s): VITAMINB12, FOLATE, FERRITIN, TIBC, IRON, RETICCTPCT in the last 72 hours. Urine analysis:    Component Value Date/Time   COLORURINE YELLOW 07/21/2019 2335   APPEARANCEUR CLEAR 07/21/2019 2335   LABSPEC 1.012 07/21/2019 2335   PHURINE 6.0 07/21/2019 2335   GLUCOSEU >=500 (A) 07/21/2019 2335   HGBUR SMALL (A) 07/21/2019 2335   BILIRUBINUR NEGATIVE 07/21/2019 2335   KETONESUR 20 (A) 07/21/2019 2335   PROTEINUR >=300 (A) 07/21/2019 2335  UROBILINOGEN 0.2 10/02/2013 2108   NITRITE NEGATIVE 07/21/2019 2335   LEUKOCYTESUR NEGATIVE 07/21/2019 2335     Maleiya Pergola M.D. [facility] 08/08/2019, 10:00 AM   Call night coverage person covering after 7pm

## 2019-08-08 NOTE — TOC Transition Note (Signed)
Transition of Care [facility]) - CM/SW Discharge Note   Patient Details  Name: Deborah [name] MRN: [id number] Date of Birth: 11-24-1965  Transition of Care [facility]) CM/SW Contact:  Dessa Phi, RN Phone Number: 08/08/2019, 10:07 AM   Clinical Narrative: d/c home. No CM needs.        Barriers to Discharge: No Barriers Identified   Patient Goals and CMS Choice Patient states their goals for this hospitalization and ongoing recovery are:: to [facility] CMS Medicare.gov Compare Post Acute Care list provided to:: Patient Choice offered to / list presented to : Patient  Discharge Placement                       Discharge Plan and Services   Discharge Planning Services: CM Consult                                 Social Determinants of Health (SDOH) Interventions     Readmission Risk Interventions Readmission Risk Prevention Plan 07/02/2019 06/25/2019 01/29/2019  Transportation Screening Complete Complete Complete  PCP or Specialist Appt within 3-5 Days - - Complete  Not Complete comments - - -  [place] or [place] - - Complete  Social Work Consult for [place] Planning/Counseling - - Complete  Palliative Care Screening - - Not Applicable  Medication Review [employer] photographer) Complete Complete Complete  PCP or Specialist appointment within 3-5 days of discharge - Complete -  [place] or Home Care Consult Complete - -  SW Recovery Care/Counseling Consult Complete Complete -  Palliative Care Screening Not Applicable Not Applicable -  [employer] Not Applicable Not Applicable -  Some recent data might be hidden

## 2019-08-08 NOTE — Progress Notes (Signed)
Patient consumed 90% of her dinner which consist of chicken tenders with juice, tolerated well with no nausea and vomiting. Patient assisted to bed. Then patient states that she is feeling nauseous and asking if she could have some nausea medicine. Told patient that will bring in medicine. Will continue to monitor.

## 2019-08-08 NOTE — Progress Notes (Signed)
PT Cancellation Note  Patient Details Name: Deborah [name] MRN: [id number] DOB: 01-Sep-1965   Cancelled Treatment:    Reason Eval/Treat Not Completed: PT screened, no needs identified, will sign off "I can function at home; I have a w/c."  Pt politely declined need for PT services.  Also declined PT earlier this admission.  PT to sign off.   Allen Egerton,KATHrine E 08/08/2019, 11:19 AM Arlyce Dice, DPT Acute Rehabilitation Services Office: [phone]

## 2019-08-09 ENCOUNTER — Ambulatory Visit: Payer: Medicare Other | Admitting: Cardiology

## 2019-08-09 DIAGNOSIS — K529 Noninfective gastroenteritis and colitis, unspecified: Secondary | ICD-10-CM

## 2019-08-09 DIAGNOSIS — C7A010 Malignant carcinoid tumor of the duodenum: Secondary | ICD-10-CM

## 2019-08-09 LAB — GLUCOSE, CAPILLARY: Glucose-Capillary: 114 mg/dL — ABNORMAL HIGH (ref 70–99)

## 2019-08-09 MED ORDER — ONDANSETRON 4 MG PO TBDP: 4.0000 mg | ORAL_TABLET | Freq: Three times a day (TID) | ORAL | 0 refills | Status: DC | PRN

## 2019-08-09 MED ORDER — PANTOPRAZOLE SODIUM 40 MG PO TBEC: 40.0000 mg | DELAYED_RELEASE_TABLET | Freq: Two times a day (BID) | ORAL | 3 refills | Status: DC

## 2019-08-09 MED ORDER — ERYTHROMYCIN BASE 250 MG PO TABS: 250.0000 mg | ORAL_TABLET | Freq: Three times a day (TID) | ORAL | 0 refills | Status: AC

## 2019-08-09 MED ORDER — ALPRAZOLAM 1 MG PO TABS: 1.0000 mg | ORAL_TABLET | Freq: Two times a day (BID) | ORAL | 0 refills | Status: DC | PRN

## 2019-08-09 MED ORDER — DICYCLOMINE HCL 20 MG PO TABS: 20.0000 mg | ORAL_TABLET | Freq: Three times a day (TID) | ORAL | 0 refills | Status: DC | PRN

## 2019-08-09 NOTE — Plan of Care (Signed)
  Problem: Health Behavior/Discharge Planning: Goal: Ability to identify and utilize available resources and services will improve Outcome: Progressing Goal: Ability to manage health-related needs will improve Outcome: Progressing   Problem: Coping: Goal: Level of anxiety will decrease Outcome: Progressing   Problem: Pain Managment: Goal: General experience of comfort will improve Outcome: Progressing

## 2019-08-09 NOTE — Discharge Summary (Signed)
Physician Discharge Summary   Patient ID: BURNETTA KOHLS MRN: [id number] DOB/AGE: 1965-11-22 54 y.o.  Admit date: 07/21/2019 Discharge date: 08/09/2019  Primary Care Physician:  [name], DO   Recommendations for Outpatient Follow-up:  1. Follow up with PCP in 1-2 weeks 2. Patient recommended to follow-up at [place] [facility], Dr. Irene Limbo, schedule outpatient PET/CT for the newly diagnosed neuroendocrine tumor.  Message sent to Dr. Irene Limbo for scheduling outpatient appointment 3. Recommend outpatient pain management and close follow-up for gastroparesis flare  Home Health: None Equipment/Devices:   Discharge Condition: stable  CODE STATUS: FULL  Diet recommendation: Carb modified diet   Discharge Diagnoses:   . Diabetic gastroparesis ([place]) . Neuroendocrine tumor, newly diagnosed . SIRS . Essential hypertension, benign . Chronic systolic CHF (congestive heart failure) ([place]) . Acute kidney injury on CKD stage IIIa . Uncontrolled type 2 diabetes mellitus with hyperglycemia ([place]) . CKD (chronic kidney disease), stage IIIa . Morbid obesity ([place]) . Anxiety Paroxysmal atrial fibrillation History of nonhemorrhagic CVA Narcotic dependence behavior  Consults: Gastroenterology Oncology, Dr. Irene Limbo    Allergies:   Allergies  Allergen Reactions  . Contrast Media [Iodinated Diagnostic Agents] Anaphylaxis    Cardiac arrest  . Diazepam [name]  . Isovue [Iopamidol] Anaphylaxis    Patient had seizure like activity and then code post 100 cc of isovue 300  . Lisinopril Anaphylaxis    Tongue and mouth swelling  . Penicillins Palpitations    Has patient had a PCN reaction causing immediate rash, facial/tongue/throat swelling, SOB or lightheadedness with hypotension: Yes, heart races Has patient had a PCN reaction causing severe rash involving mucus membranes or skin necrosis: No Has patient had a PCN reaction that required hospitalization: Yes  Has patient  had a PCN reaction occurring within the last 10 years: No   . Acetaminophen Nausea Only and Other (See Comments)    Irritates stomach ulcer  Abdominal pain  . Tolmetin Nausea Only and Other (See Comments)    ULCER  . Aspirin Other (See Comments)    Irritates stomach ulcer   . Metoclopramide Other (See Comments)    Tardive dyskinesia  . Nsaids Other (See Comments)    ULCER  . Tramadol Nausea And Vomiting     DISCHARGE MEDICATIONS: Allergies as of 08/09/2019      Reactions   Contrast Media [iodinated Diagnostic Agents] Anaphylaxis   Cardiac arrest   Diazepam [name]   Isovue [iopamidol] Anaphylaxis   Patient had seizure like activity and then code post 100 cc of isovue 300   Lisinopril Anaphylaxis   Tongue and mouth swelling   Penicillins Palpitations   Has patient had a PCN reaction causing immediate rash, facial/tongue/throat swelling, SOB or lightheadedness with hypotension: Yes, heart races Has patient had a PCN reaction causing severe rash involving mucus membranes or skin necrosis: No Has patient had a PCN reaction that required hospitalization: Yes  Has patient had a PCN reaction occurring within the last 10 years: No   Acetaminophen Nausea Only, Other (See Comments)   Irritates stomach ulcer  Abdominal pain   Tolmetin Nausea Only, Other (See Comments)   ULCER   Aspirin Other (See Comments)   Irritates stomach ulcer    Metoclopramide Other (See Comments)   Tardive dyskinesia   Nsaids Other (See Comments)   ULCER   Tramadol Nausea And Vomiting      Medication List    TAKE these medications   albuterol (2.5 MG/3ML) 0.083% nebulizer solution Commonly  known as: PROVENTIL Take 3 mLs (2.5 mg total) by nebulization every 6 (six) hours as needed for wheezing or shortness of breath.   ALPRAZolam 1 MG tablet Commonly known as: XANAX Take 1 tablet (1 mg total) by mouth 2 (two) times daily as needed for anxiety. What changed: when to take this    atorvastatin 10 MG tablet Commonly known as: LIPITOR Take 1 tablet (10 mg total) by mouth daily.   blood glucose meter kit and supplies Kit Dispense based on patient and insurance preference. Use up to four times daily as directed. (FOR ICD-9 250.00, 250.01).   cetirizine 10 MG tablet Commonly known as: ZYRTEC Take 1 tablet (10 mg total) by mouth daily.   dicyclomine 20 MG tablet Commonly known as: BENTYL Take 1 tablet (20 mg total) by mouth 3 (three) times daily as needed for spasms (abdominal cramping).   DULoxetine HCl 40 MG Cpep TAKE ONE CAPSULE BY MOUTH TWICE DAILY What changed:   how much to take  when to take this   Eliquis 5 MG Tabs tablet Generic drug: apixaban TAKE 1 TABLET (5 MG TOTAL) BY MOUTH 2 (TWO) TIMES DAILY. What changed: See the new instructions.   erythromycin 250 MG tablet Commonly known as: E-MYCIN Take 1 tablet (250 mg total) by mouth 4 (four) times daily -  with meals and at bedtime for 7 days.   fluticasone 50 MCG/ACT nasal spray Commonly known as: FLONASE Place 2 sprays into both nostrils daily as needed for allergies or rhinitis.   hydrALAZINE 25 MG tablet Commonly known as: APRESOLINE Take 1 tablet (25 mg total) by mouth 3 (three) times daily. For blood pressure   isosorbide mononitrate 60 MG 24 hr tablet Commonly known as: IMDUR Take 1 tablet (60 mg total) by mouth daily.   Lantus SoloStar 100 UNIT/ML Solostar Pen Generic drug: insulin glargine Inject 45 Units into the skin at bedtime.   loperamide 2 MG capsule Commonly known as: IMODIUM Take 2 mg by mouth 4 (four) times daily as needed for diarrhea or loose stools.   magnesium oxide 400 (241.3 Mg) MG tablet Commonly known as: MAG-OX Take 1 tablet (400 mg total) by mouth 2 (two) times daily.   melatonin 3 MG Tabs tablet Take 2 tablets (6 mg total) by mouth at bedtime.   metoprolol succinate 25 MG 24 hr tablet Commonly known as: TOPROL-XL Take 25 mg by mouth daily.    nitroGLYCERIN 0.4 MG SL tablet Commonly known as: NITROSTAT Place 1 tablet (0.4 mg total) under the tongue every 5 (five) minutes as needed for chest pain.   NovoLOG FlexPen 100 UNIT/ML FlexPen Generic drug: insulin aspart Inject 15 Units into the skin 3 (three) times daily with meals. What changed: how much to take   ondansetron 4 MG disintegrating tablet Commonly known as: ZOFRAN-ODT Take 1 tablet (4 mg total) by mouth every 8 (eight) hours as needed for nausea or vomiting.   pantoprazole 40 MG tablet Commonly known as: PROTONIX Take 1 tablet (40 mg total) by mouth 2 (two) times daily before a meal. What changed: when to take this   torsemide 20 MG tablet Commonly known as: DEMADEX Take 2 tablets (40 mg total) by mouth daily. Hold if having significant vomiting        Brief H and P: For complete details please refer to admission H and P, but in brief  54 year old female with history of depression, nonhemorrhagic CVA, chronic systolic CHF PAF on Eliquis, acute distress 3  8, hypertension, gout, GERD/gastric ulcer, anemia of chronic disease T2DM with recurrent admission for DKA/diabetic gastroparesis presented with nausea vomiting diffuse abdominal pain. Recently was admitted for gastroparesis earlier this month GI. Patient on presentation had tachycardia tachypnea and lactic acidosis was admitted, seen by GI. Underwent GES which was abnormal then EGD to rule out gastric outlet obstruction but no acute finding, underwent colonoscopy subsequently that showed 2 to 3 mm polyp in the descending colon which was biopsied,. Biopsy findings showed well-differentiated neuroendocrine tumor Patient underwent EGD with polypectomy 5/27.  Hospital Course:   Chronic abdominal pain, gastroparesis with underlying history of uncontrolled diabetes Narcotic dependence behavior -Recurrent admissions for uncontrolled diabetes, abdominal pain, gastroparesis flare -On erythromycin 3 times a day,  cannot take Reglan due to history of tardive dyskinesia -GI was consulted, underwent gastric emptying study, EGD and colonoscopy, Endo Clip placed.   -On Zofran,  erythromycin, PPI twice daily -Avoid opiates.  I explained to the patient (with RN in the room), that opiate use likely exacerbates her gastroparesis.  Patient was recommended to avoid narcotics and Reglan during the previous admission.  During the previous admission short course of Xanax worked well in the hospital and was given limited supply on discharge. -Patient reported to me that if she is not given narcotics on discharge, she will come right back to the ED for pain medications. She states that her PCP is not doing the pain management hence she just comes back to the hospital whenever she has the pain.  Patient is being [facility] health family medicine teaching service.  Patient had no recent follow-up appointments.  I recommended patient to follow-up with her PCP, gastroenterology outpatient.  She may discuss with her PCP regarding outpatient pain management. -Currently tolerating diet without any difficulty.   Neuroendocrine tumor -Patient underwent EGD on 5/27, status post polypectomy.  The biopsies showed low-grade neuroendocrine tumor  (carcinoid tumor ), probably present at multiple tissue edges with lymphovascular invasion.  -Patient was seen by Dr. Irene Limbo, advised that her duodenal carcinoid is low-grade tumor, recommended PET CT scan, if localized GI could perform an endoscopic guided resection.  If it has spread, will consider surgical resection. -Patient currently tolerating diet, will be discharged home today.  She will follow-up with oncology to schedule outpatient PET/CT  Diabetes mellitus type 2, uncontrolled -Recent hemoglobin A1c of 8.4, continue outpatient regimen, follow-up closely with PCP  Acute kidney injury on CKD stage IIIa Baseline creatinine 1.3, Creatinine trended up to 2.1 during  hospitalization, improved with IV fluids, down to 1.2 Torsemide was held during admission, creatinine has improved to 1.2, at baseline 2D echo 07/2019 had shown EF of 45 to 50%, global hypokinesis, may restart torsemide on 08/11/2019  SIRS -On admission, tachypnea, tachycardia, lactic acidosis, elevated WBCs -Sepsis ruled out, antibiotics discontinued, lactate normalized, procalcitonin negative  Chronic systolic CHF -Currently stable and euvolemic, may resume torsemide on 08/11/2019 outpatient 2D echo showed EF 45 to 50%, global hypokinesis similar to previous echo in 11/20  Hypertension Currently stable  Paroxysmal atrial fibrillation On Eliquis, continue Toprol for rate control  History of nonhemorrhagic CVA -Continue Lipitor, resumed Eliquis  GERD: continue PPI  Obesity Estimated body mass index is 56.26 kg/m as calculated from the following:   Height as of this encounter: '5\' 6"'  (1.676 m).   Weight as of this encounter: 158.1 kg.  Counseled on diet and weight control  Day of Discharge S: No acute complaints today, tolerating solid diet, no ongoing nausea or  vomiting.  BP 126/72 (BP Location: Right Arm)   Pulse 92   Temp 98 F (36.7 C) (Oral)   Resp 18   Ht '5\' 6"'  (1.676 m)   Wt (!) 158.1 kg   LMP 10/10/2012   SpO2 96%   BMI 56.26 kg/m   Physical Exam: General: Alert and awake oriented x3 not in any acute distress. HEENT: anicteric sclera, pupils reactive to light and accommodation CVS: S1-S2 clear no murmur rubs or gallops Chest: clear to auscultation bilaterally, no wheezing rales or rhonchi Abdomen: soft nontender, nondistended, normal bowel sounds Extremities: no cyanosis, clubbing or edema noted bilaterally Neuro: Cranial nerves II-XII intact, no focal neurological deficits    Get Medicines reviewed and adjusted: Please take all your medications with you for your next visit with your Primary MD  Please request your Primary MD to go over all hospital  tests and procedure/radiological results at the follow up. Please ask your Primary MD to get all Hospital records sent to his/her office.  If you experience worsening of your admission symptoms, develop shortness of breath, life threatening emergency, suicidal or homicidal thoughts you must seek medical attention immediately by calling 911 or calling your MD immediately  if symptoms less severe.  You must read complete instructions/literature along with all the possible adverse reactions/side effects for all the Medicines you take and that have been prescribed to you. Take any new Medicines after you have completely understood and accept all the possible adverse reactions/side effects.   Do not drive when taking pain medications.   Do not take more than prescribed Pain, Sleep and Anxiety Medications  Special Instructions: If you have smoked or chewed Tobacco  in the last 2 yrs please stop smoking, stop any regular Alcohol  and or any Recreational drug use.  Wear Seat belts while driving.  Please note  You were cared for by a hospitalist during your hospital stay. Once you are discharged, your primary care physician will handle any further medical issues. Please note that NO REFILLS for any discharge medications will be authorized once you are discharged, as it is imperative that you return to your primary care physician (or establish a relationship with a primary care physician if you do not have one) for your aftercare needs so that they can reassess your need for medications and monitor your lab values.   The results of significant diagnostics from this hospitalization (including imaging, microbiology, ancillary and laboratory) are listed below for reference.      Procedures/Studies:  CT ABDOMEN PELVIS WO CONTRAST  Result Date: 07/22/2019 CLINICAL DATA:  Diffuse abdominal pain, nausea and vomiting. EXAM: CT ABDOMEN AND PELVIS WITHOUT CONTRAST TECHNIQUE: Multidetector CT imaging of the  abdomen and pelvis was performed following the standard protocol without IV contrast. COMPARISON:  CT abdomen dated 07/10/2019. FINDINGS: Lower chest: No acute abnormality. Hepatobiliary: No focal liver abnormality is seen. Status post cholecystectomy. No biliary dilatation. Pancreas: Unremarkable. No pancreatic ductal dilatation or surrounding inflammatory changes. Spleen: Normal in size without focal abnormality. Adrenals/Urinary Tract: Adrenal glands appear normal. Kidneys are unremarkable without mass, stone or hydronephrosis. No ureteral or bladder calculi. Bladder appears normal. Stomach/Bowel: No dilated large or small bowel loops. No evidence of bowel wall inflammation. Appendix is normal. Stomach is unremarkable. Vascular/Lymphatic: No significant vascular findings are present. No enlarged abdominal or pelvic lymph nodes. Reproductive: Uterine fibroid measures 5 cm. No acute findings. Again noted is some degree of pelvic floor laxity. Other: No free fluid or abscess collection. No  free intraperitoneal air. Musculoskeletal: Degenerative spondylosis of the lower lumbar spine, mild to moderate in degree. No acute or suspicious osseous finding. IMPRESSION: 1. No acute findings within the abdomen or pelvis. No bowel obstruction or evidence of bowel wall inflammation. No evidence of acute solid organ abnormality. No renal or ureteral calculi. Appendix is normal. 2. Uterine fibroid measures 5 cm. 3. Degenerative spondylosis of the lower lumbar spine, mild to moderate in degree. Electronically Signed   By: Franki Cabot M.D.   On: 07/22/2019 09:14   CT ABDOMEN WO CONTRAST  Result Date: 08/03/2019 CLINICAL DATA:  54 year old female with abdominal pain. Status post recent endoscopy. EXAM: CT ABDOMEN WITHOUT CONTRAST TECHNIQUE: Multidetector CT imaging of the abdomen was performed following the standard protocol without IV contrast. COMPARISON:  CT abdomen pelvis dated 07/22/2019. FINDINGS: Evaluation of this exam  is limited in the absence of intravenous contrast. Lower chest: There is diffuse interstitial and interlobular septal prominence which may represent mild edema. Clinical correlation is recommended. No free air or free fluid identified in the visualized upper abdomen. Hepatobiliary: Fatty infiltration of the liver. No intrahepatic biliary ductal dilatation. Cholecystectomy. Pancreas: Unremarkable. No pancreatic ductal dilatation or surrounding inflammatory changes. Spleen: Normal in size without focal abnormality. Adrenals/Urinary Tract: The adrenal glands and kidneys are unremarkable. Stomach/Bowel: There is a 15 mm linear radiopaque metallic density in the region of the duodenal bulb, likely an ingested foreign object. No bowel obstruction or active inflammation in the visualized abdomen. Vascular/Lymphatic: Mild atherosclerotic calcification of the aorta. No adenopathy. Other: None Musculoskeletal: Mild degenerative changes of the spine. No acute osseous pathology. IMPRESSION: 1. A 15 mm linear radiopaque metallic density in the region of the duodenal bulb, likely an ingested foreign object. No bowel obstruction or active inflammation in the visualized abdomen. No evidence of perforation. 2. Fatty liver. 3. Aortic Atherosclerosis (ICD10-I70.0). Electronically Signed   By: Anner Crete M.D.   On: 08/03/2019 15:30   DG Chest 2 View  Result Date: 07/21/2019 CLINICAL DATA:  Chest pain EXAM: CHEST - 2 VIEW COMPARISON:  07/02/2019 FINDINGS: Mild cardiomegaly. No focal opacity or pleural effusion. No pneumothorax. IMPRESSION: Mild cardiomegaly. No edema or infiltrate. Electronically Signed   By: Donavan Foil M.D.   On: 07/21/2019 20:19   NM GASTRIC EMPTYING  Result Date: 07/26/2019 CLINICAL DATA:  Nausea and vomiting EXAM: NUCLEAR MEDICINE GASTRIC EMPTYING SCAN TECHNIQUE: After oral ingestion of radiolabeled meal, sequential abdominal images were obtained for 4 hours. Percentage of activity emptying the  stomach was calculated at 1 hour, 2 hour, 3 hour, and 4 hours. RADIOPHARMACEUTICALS:  1.9 mCi Tc-58msulfur colloid in standardized meal COMPARISON:  CT abdomen pelvis 07/22/2019 FINDINGS: Expected location of the stomach in the left upper quadrant. Ingested meal empties the stomach gradually over the course of the study. 1.5% emptied at 1 hr ( normal >= 10%) 6.3% emptied at 2 hr ( normal >= 40%) 16% emptied at 3 hr ( normal >= 70%) 46% emptied at 4 hr ( normal >= 90%) IMPRESSION: Delayed gastric emptying study. Electronically Signed   By: NDavina PokeD.O.   On: 07/26/2019 13:53   EEG adult  Result Date: 07/17/2019 YLora Havens MD     07/17/2019  2:13 PM Patient Name: CTYASHIA MORRISETTEMRN: [id number] Attending: PLora HavensReferring Physician/Provider: Dr. OAnson FretDate: 07/17/2019 Duration: 25.26 minutes Patient history: 54year old female with tremor-like movements.  EEG to evaluate for seizures. Level of alertness: Awake, asleep AEDs during EEG  study: Xanax Technical aspects: This EEG study was done with scalp electrodes positioned according to the 10-20 International system of electrode placement. Electrical activity was acquired at a sampling rate of '500Hz'  and reviewed with a high frequency filter of '70Hz'  and a low frequency filter of '1Hz' . EEG data were recorded continuously and digitally stored. Description: The posterior dominant rhythm consists of 8-9 Hz activity of moderate voltage (25-35 uV) seen predominantly in posterior head regions, symmetric and reactive to eye opening and eye closing.  Sleep was characterized by vertex waves, sleep spindles (12 to 14 Hz), maximal frontocentral region.  Physiologic photic driving was seen during photic stimulation.  Hyperventilation was not performed.        IMPRESSION: This study is within normal limits. No seizures or epileptiform discharges were seen throughout the recording. Lora Havens   ECHOCARDIOGRAM  COMPLETE  Result Date: 07/23/2019    ECHOCARDIOGRAM REPORT   Patient Name:   JAZIA FARACI Date of Exam: 07/23/2019 Medical Rec #:  [id number]            Height:       66.0 in Accession #:    [id number]           Weight:       320.1 lb Date of Birth:  [birth year]            BSA:          2.442 m Patient Age:    35 years             BP:           123/51 mmHg Patient Gender: F                    HR:           111 bpm. Exam Location:  Inpatient Procedure: 2D Echo, Cardiac Doppler and Color Doppler Indications:    Congestive Heart Failure 428.0 / I50.9  History:        Patient has prior history of Echocardiogram examinations, most                 recent 01/26/2019. CHF, Stroke and COPD, Arrythmias:Cardiac                 Arrest; Risk Factors:Hypertension, Diabetes and Non-Smoker.                 GERD. Elevated troponin.  Sonographer:    Vickie Epley RDCS Referring Phys: [id number] [place]  1. Left ventricular ejection fraction, by estimation, is 45 to 50%. The left ventricle has mildly decreased function. The left ventricle demonstrates global hypokinesis. Left ventricular diastolic function could not be evaluated.  2. Right ventricular systolic function is normal. The right ventricular size is normal. There is moderately elevated pulmonary artery systolic pressure.  3. The mitral valve is normal in structure. Trivial mitral valve regurgitation. No evidence of mitral stenosis.  4. The aortic valve is tricuspid. Aortic valve regurgitation is not visualized. Mild aortic valve sclerosis is present, with no evidence of aortic valve stenosis.  5. The inferior vena cava is normal in size with greater than 50% respiratory variability, suggesting right atrial pressure of 3 mmHg. FINDINGS  Left Ventricle: Left ventricular ejection fraction, by estimation, is 45 to 50%. The left ventricle has mildly decreased function. The left ventricle demonstrates global hypokinesis. The left ventricular internal cavity  size was normal in size. There is  no left ventricular hypertrophy.  Left ventricular diastolic function could not be evaluated. Right Ventricle: The right ventricular size is normal. No increase in right ventricular wall thickness. Right ventricular systolic function is normal. There is moderately elevated pulmonary artery systolic pressure. The tricuspid regurgitant velocity is 3.35 m/s, and with an assumed right atrial pressure of 3 mmHg, the estimated right ventricular systolic pressure is 75.6 mmHg. Left Atrium: Left atrial size was normal in size. Right Atrium: Right atrial size was normal in size. Pericardium: A small pericardial effusion is present. Mitral Valve: The mitral valve is normal in structure. Trivial mitral valve regurgitation. No evidence of mitral valve stenosis. Tricuspid Valve: The tricuspid valve is normal in structure. Tricuspid valve regurgitation is trivial. No evidence of tricuspid stenosis. Aortic Valve: The aortic valve is tricuspid. Aortic valve regurgitation is not visualized. Mild aortic valve sclerosis is present, with no evidence of aortic valve stenosis. There is mild calcification of the aortic valve. Pulmonic Valve: The pulmonic valve was not well visualized. Pulmonic valve regurgitation is not visualized. No evidence of pulmonic stenosis. Aorta: The aortic root, ascending aorta and aortic arch are all structurally normal, with no evidence of dilitation or obstruction. Venous: The inferior vena cava is normal in size with greater than 50% respiratory variability, suggesting right atrial pressure of 3 mmHg. IAS/Shunts: No atrial level shunt detected by color flow Doppler.  LEFT VENTRICLE PLAX 2D LVIDd:         5.50 cm LVIDs:         4.00 cm LV PW:         1.10 cm LV IVS:        1.10 cm LVOT diam:     2.30 cm LV SV:         74 LV SV Index:   30 LVOT Area:     4.15 cm  LV Volumes (MOD) LV vol d, MOD A2C: 175.0 ml LV vol d, MOD A4C: 173.0 ml LV vol s, MOD A2C: 86.2 ml LV vol s, MOD  A4C: 91.2 ml LV SV MOD A2C:     88.8 ml LV SV MOD A4C:     173.0 ml LV SV MOD BP:      85.9 ml RIGHT VENTRICLE RV S prime:     14.70 cm/s TAPSE (M-mode): 2.3 cm LEFT ATRIUM             Index       RIGHT ATRIUM           Index LA diam:        4.60 cm 1.88 cm/m  RA Area:     12.00 cm LA Vol (A2C):   49.7 ml 20.35 ml/m RA Volume:   24.60 ml  10.07 ml/m LA Vol (A4C):   53.7 ml 21.99 ml/m LA Biplane Vol: 54.2 ml 22.19 ml/m  AORTIC VALVE LVOT Vmax:   102.00 cm/s LVOT Vmean:  70.100 cm/s LVOT VTI:    0.177 m  AORTA Ao Root diam: 3.40 cm MITRAL VALVE                TRICUSPID VALVE MV Area (PHT): 5.88 cm     TR Peak grad:   44.9 mmHg MV Decel Time: 129 msec     TR Vmax:        335.00 cm/s MR Peak grad: 87.2 mmHg MR Vmax:      467.00 cm/s   SHUNTS MV E velocity: 158.00 cm/s  Systemic VTI:  0.18 m  Systemic Diam: 2.30 cm Buford Dresser MD Electronically signed by Buford Dresser MD Signature Date/Time: 07/23/2019/1:59:25 PM    Final        LAB RESULTS: Basic Metabolic Panel: Recent Labs  Lab 08/04/19 0443 08/07/19 1046  NA 137 140  K 3.0* 3.8  CL 103 108  CO2 25 23  GLUCOSE 135* 157*  BUN 18 30*  CREATININE 1.25* 1.28*  CALCIUM 8.2* 8.4*   Liver Function Tests: Recent Labs  Lab 08/03/19 0322 08/07/19 1046  AST 10* 11*  ALT 10 10  ALKPHOS 92 88  BILITOT 0.7 0.7  PROT 6.1* 6.4*  ALBUMIN 2.7* 2.7*   No results for input(s): LIPASE, AMYLASE in the last 168 hours. No results for input(s): AMMONIA in the last 168 hours. CBC: Recent Labs  Lab 08/04/19 0443 08/04/19 0443 08/07/19 1046  WBC 12.0*  --  10.4  NEUTROABS  --   --  7.5  HGB 7.7*  --  8.2*  HCT 25.3*  --  27.9*  MCV 89.7   < > 91.2  PLT 367  --  361   < > = values in this interval not displayed.   Cardiac Enzymes: No results for input(s): CKTOTAL, CKMB, CKMBINDEX, TROPONINI in the last 168 hours. BNP: Invalid input(s): POCBNP CBG: Recent Labs  Lab 08/08/19 2036  08/09/19 0728  GLUCAP 157* 114*       Disposition and Follow-up: Discharge Instructions    Diet - low sodium heart healthy   Complete by: As directed    Diet Carb Modified   Complete by: As directed    Discharge instructions   Complete by: As directed    It is VERY IMPORTANT that you follow up with a PCP on a regular basis.  Check your blood glucoses before each meal and at bedtime and maintain a log of your readings.  Bring this log with you when you follow up with your PCP so that he or she can adjust your insulin at your follow up visit.   Increase activity slowly   Complete by: As directed    Increase activity slowly   Complete by: As directed        DISPOSITION: Home   DISCHARGE FOLLOW-UP Follow-up Information    Sueanne Margarita, MD Follow up on 07/31/2019.   Specialty: Cardiology Why: Please arrive 15 minutes early for your 9:20am post-hospital follow-up appointment.  Contact information: [address] [place] [address] [phone]        Otis Brace, MD. Schedule an appointment as soon as possible for a visit in 2 week(s).   Specialty: Gastroenterology Contact information: [place] [address] [phone]        [name], DO. Schedule an appointment as soon as possible for a visit in 1 week(s).   Specialty: Family Medicine Why: for hospital follow-up Contact information: [address] [place] [address] [phone]        Brunetta Genera, MD. Schedule an appointment as soon as possible for a visit in 2 week(s).   Specialties: Hematology, Oncology Why: Please call tomorrow if you haven't been received a call for appointment from Dr. Grier Mitts office.  Contact information: [place] [address] [phone]            Time coordinating discharge:  35 minutes  Signed:   Estill Cotta M.D. [facility] 08/09/2019, 11:43  AM

## 2019-08-09 NOTE — Progress Notes (Signed)
Patient woke up at 0030H, claims that she is feeling nauseous and panicky and requested prn med, Offered some ice chips and deep breathing. PRN med given, slept good, no other complaints noted.

## 2019-08-10 ENCOUNTER — Telehealth: Payer: Self-pay | Admitting: Hematology

## 2019-08-10 ENCOUNTER — Encounter: Payer: Self-pay | Admitting: Hematology

## 2019-08-10 NOTE — Telephone Encounter (Signed)
Received a staff msg to schedule a hospital follow up for Ms. Everly to see Dr. Irene Limbo on 6/16 at 11am. I lft the appt date and time on the patient's voicemail. Letter mailed.

## 2019-08-14 NOTE — Progress Notes (Signed)
Left voice message for patient regarding upcoming appointment with Dr. Irene Limbo on 6/16 at 35, explained my role as GI nurse navigator, left my direct contact number and encouraged her to call back if she has any questions.

## 2019-08-19 ENCOUNTER — Inpatient Hospital Stay (HOSPITAL_COMMUNITY)
Admit: 2019-08-19 | Discharge: 2019-08-31 | DRG: 073 | Disposition: A | Discharge status: Home / Self Care | Payer: Medicare Other | Attending: Internal Medicine | Admitting: Internal Medicine

## 2019-08-19 ENCOUNTER — Encounter (HOSPITAL_COMMUNITY): Payer: Self-pay | Admitting: Emergency Medicine

## 2019-08-19 ENCOUNTER — Emergency Department (HOSPITAL_COMMUNITY): Payer: Medicare Other

## 2019-08-19 ENCOUNTER — Other Ambulatory Visit: Payer: Self-pay

## 2019-08-19 DIAGNOSIS — K3184 Gastroparesis: Secondary | ICD-10-CM

## 2019-08-19 DIAGNOSIS — R1030 Lower abdominal pain, unspecified: Secondary | ICD-10-CM | POA: Diagnosis present

## 2019-08-19 DIAGNOSIS — E873 Alkalosis: Secondary | ICD-10-CM | POA: Diagnosis present

## 2019-08-19 DIAGNOSIS — I130 Hypertensive heart and chronic kidney disease with heart failure and stage 1 through stage 4 chronic kidney disease, or unspecified chronic kidney disease: Secondary | ICD-10-CM | POA: Diagnosis present

## 2019-08-19 DIAGNOSIS — D3A010 Benign carcinoid tumor of the duodenum: Secondary | ICD-10-CM | POA: Diagnosis present

## 2019-08-19 DIAGNOSIS — Z6841 Body Mass Index (BMI) 40.0 and over, adult: Secondary | ICD-10-CM

## 2019-08-19 DIAGNOSIS — E869 Volume depletion, unspecified: Secondary | ICD-10-CM | POA: Diagnosis present

## 2019-08-19 DIAGNOSIS — E1143 Type 2 diabetes mellitus with diabetic autonomic (poly)neuropathy: Secondary | ICD-10-CM | POA: Diagnosis not present

## 2019-08-19 DIAGNOSIS — F419 Anxiety disorder, unspecified: Secondary | ICD-10-CM | POA: Diagnosis present

## 2019-08-19 DIAGNOSIS — Z8249 Family history of ischemic heart disease and other diseases of the circulatory system: Secondary | ICD-10-CM

## 2019-08-19 DIAGNOSIS — I480 Paroxysmal atrial fibrillation: Secondary | ICD-10-CM | POA: Diagnosis present

## 2019-08-19 DIAGNOSIS — Z794 Long term (current) use of insulin: Secondary | ICD-10-CM

## 2019-08-19 DIAGNOSIS — I5043 Acute on chronic combined systolic (congestive) and diastolic (congestive) heart failure: Secondary | ICD-10-CM | POA: Diagnosis present

## 2019-08-19 DIAGNOSIS — Z9114 Patient's other noncompliance with medication regimen: Secondary | ICD-10-CM

## 2019-08-19 DIAGNOSIS — M109 Gout, unspecified: Secondary | ICD-10-CM | POA: Diagnosis present

## 2019-08-19 DIAGNOSIS — R1013 Epigastric pain: Secondary | ICD-10-CM | POA: Diagnosis present

## 2019-08-19 DIAGNOSIS — Z886 Allergy status to analgesic agent status: Secondary | ICD-10-CM

## 2019-08-19 DIAGNOSIS — E876 Hypokalemia: Secondary | ICD-10-CM | POA: Diagnosis not present

## 2019-08-19 DIAGNOSIS — R000 Tachycardia, unspecified: Secondary | ICD-10-CM | POA: Diagnosis present

## 2019-08-19 DIAGNOSIS — R0789 Other chest pain: Secondary | ICD-10-CM | POA: Diagnosis present

## 2019-08-19 DIAGNOSIS — M797 Fibromyalgia: Secondary | ICD-10-CM | POA: Diagnosis present

## 2019-08-19 DIAGNOSIS — G8929 Other chronic pain: Secondary | ICD-10-CM | POA: Diagnosis present

## 2019-08-19 DIAGNOSIS — Z9111 Patient's noncompliance with dietary regimen: Secondary | ICD-10-CM

## 2019-08-19 DIAGNOSIS — J449 Chronic obstructive pulmonary disease, unspecified: Secondary | ICD-10-CM | POA: Diagnosis present

## 2019-08-19 DIAGNOSIS — E860 Dehydration: Secondary | ICD-10-CM | POA: Diagnosis present

## 2019-08-19 DIAGNOSIS — Z8673 Personal history of transient ischemic attack (TIA), and cerebral infarction without residual deficits: Secondary | ICD-10-CM

## 2019-08-19 DIAGNOSIS — Z20822 Contact with and (suspected) exposure to covid-19: Secondary | ICD-10-CM | POA: Diagnosis present

## 2019-08-19 DIAGNOSIS — K219 Gastro-esophageal reflux disease without esophagitis: Secondary | ICD-10-CM | POA: Diagnosis present

## 2019-08-19 DIAGNOSIS — N183 Chronic kidney disease, stage 3 unspecified: Secondary | ICD-10-CM | POA: Diagnosis present

## 2019-08-19 DIAGNOSIS — E8770 Fluid overload, unspecified: Secondary | ICD-10-CM | POA: Diagnosis not present

## 2019-08-19 DIAGNOSIS — Z79899 Other long term (current) drug therapy: Secondary | ICD-10-CM

## 2019-08-19 DIAGNOSIS — F329 Major depressive disorder, single episode, unspecified: Secondary | ICD-10-CM | POA: Diagnosis present

## 2019-08-19 DIAGNOSIS — R809 Proteinuria, unspecified: Secondary | ICD-10-CM | POA: Diagnosis present

## 2019-08-19 DIAGNOSIS — Z888 Allergy status to other drugs, medicaments and biological substances status: Secondary | ICD-10-CM

## 2019-08-19 DIAGNOSIS — D519 Vitamin B12 deficiency anemia, unspecified: Secondary | ICD-10-CM | POA: Diagnosis present

## 2019-08-19 DIAGNOSIS — D631 Anemia in chronic kidney disease: Secondary | ICD-10-CM | POA: Diagnosis present

## 2019-08-19 DIAGNOSIS — E1165 Type 2 diabetes mellitus with hyperglycemia: Secondary | ICD-10-CM | POA: Diagnosis present

## 2019-08-19 DIAGNOSIS — Z880 Allergy status to penicillin: Secondary | ICD-10-CM

## 2019-08-19 DIAGNOSIS — I428 Other cardiomyopathies: Secondary | ICD-10-CM | POA: Diagnosis present

## 2019-08-19 DIAGNOSIS — E1122 Type 2 diabetes mellitus with diabetic chronic kidney disease: Secondary | ICD-10-CM | POA: Diagnosis present

## 2019-08-19 DIAGNOSIS — E785 Hyperlipidemia, unspecified: Secondary | ICD-10-CM | POA: Diagnosis present

## 2019-08-19 DIAGNOSIS — R079 Chest pain, unspecified: Secondary | ICD-10-CM

## 2019-08-19 DIAGNOSIS — M5432 Sciatica, left side: Secondary | ICD-10-CM | POA: Diagnosis present

## 2019-08-19 DIAGNOSIS — M5431 Sciatica, right side: Secondary | ICD-10-CM | POA: Diagnosis present

## 2019-08-19 DIAGNOSIS — Z9981 Dependence on supplemental oxygen: Secondary | ICD-10-CM

## 2019-08-19 DIAGNOSIS — Z91041 Radiographic dye allergy status: Secondary | ICD-10-CM

## 2019-08-19 DIAGNOSIS — M79672 Pain in left foot: Secondary | ICD-10-CM | POA: Diagnosis not present

## 2019-08-19 DIAGNOSIS — N179 Acute kidney failure, unspecified: Secondary | ICD-10-CM | POA: Diagnosis present

## 2019-08-19 DIAGNOSIS — Z7901 Long term (current) use of anticoagulants: Secondary | ICD-10-CM

## 2019-08-19 DIAGNOSIS — D72829 Elevated white blood cell count, unspecified: Secondary | ICD-10-CM | POA: Diagnosis present

## 2019-08-19 DIAGNOSIS — Z833 Family history of diabetes mellitus: Secondary | ICD-10-CM

## 2019-08-19 DIAGNOSIS — D509 Iron deficiency anemia, unspecified: Secondary | ICD-10-CM | POA: Diagnosis present

## 2019-08-19 DIAGNOSIS — R109 Unspecified abdominal pain: Secondary | ICD-10-CM

## 2019-08-19 DIAGNOSIS — M79671 Pain in right foot: Secondary | ICD-10-CM | POA: Diagnosis not present

## 2019-08-19 DIAGNOSIS — Z8711 Personal history of peptic ulcer disease: Secondary | ICD-10-CM

## 2019-08-19 DIAGNOSIS — G2401 Drug induced subacute dyskinesia: Secondary | ICD-10-CM | POA: Diagnosis present

## 2019-08-19 LAB — CBC WITH DIFFERENTIAL/PLATELET
Abs Immature Granulocytes: 0.06 10*3/uL (ref 0.00–0.07)
Basophils Absolute: 0.1 10*3/uL (ref 0.0–0.1)
Basophils Relative: 1 %
Eosinophils Absolute: 0.2 10*3/uL (ref 0.0–0.5)
Eosinophils Relative: 2 %
HCT: 35.4 % — ABNORMAL LOW (ref 36.0–46.0)
Hemoglobin: 11.1 g/dL — ABNORMAL LOW (ref 12.0–15.0)
Immature Granulocytes: 1 %
Lymphocytes Relative: 17 %
Lymphs Abs: 2 10*3/uL (ref 0.7–4.0)
MCH: 26.6 pg (ref 26.0–34.0)
MCHC: 31.4 g/dL (ref 30.0–36.0)
MCV: 84.9 fL (ref 80.0–100.0)
Monocytes Absolute: 0.5 10*3/uL (ref 0.1–1.0)
Monocytes Relative: 4 %
Neutro Abs: 8.9 10*3/uL — ABNORMAL HIGH (ref 1.7–7.7)
Neutrophils Relative %: 75 %
Platelets: 436 10*3/uL — ABNORMAL HIGH (ref 150–400)
RBC: 4.17 MIL/uL (ref 3.87–5.11)
RDW: 14.9 % (ref 11.5–15.5)
WBC: 11.7 10*3/uL — ABNORMAL HIGH (ref 4.0–10.5)
nRBC: 0 % (ref 0.0–0.2)

## 2019-08-19 LAB — COMPREHENSIVE METABOLIC PANEL
ALT: 20 U/L (ref 0–44)
AST: 21 U/L (ref 15–41)
Albumin: 3.5 g/dL (ref 3.5–5.0)
Alkaline Phosphatase: 106 U/L (ref 38–126)
Anion gap: 14 (ref 5–15)
BUN: 27 mg/dL — ABNORMAL HIGH (ref 6–20)
CO2: 20 mmol/L — ABNORMAL LOW (ref 22–32)
Calcium: 9.5 mg/dL (ref 8.9–10.3)
Chloride: 107 mmol/L (ref 98–111)
Creatinine, Ser: 1.61 mg/dL — ABNORMAL HIGH (ref 0.44–1.00)
GFR calc Af Amer: 41 mL/min — ABNORMAL LOW (ref >60)
GFR calc non Af Amer: 36 mL/min — ABNORMAL LOW (ref >60)
Glucose, Bld: 218 mg/dL — ABNORMAL HIGH (ref 70–99)
Potassium: 3.1 mmol/L — ABNORMAL LOW (ref 3.5–5.1)
Sodium: 141 mmol/L (ref 135–145)
Total Bilirubin: 0.8 mg/dL (ref 0.3–1.2)
Total Protein: 8.3 g/dL — ABNORMAL HIGH (ref 6.5–8.1)

## 2019-08-19 LAB — BLOOD GAS, VENOUS
Acid-base deficit: 1.3 mmol/L (ref 0.0–2.0)
Bicarbonate: 20.1 mmol/L (ref 20.0–28.0)
FIO2: 21
O2 Saturation: 62.3 %
Patient temperature: 98.6
pCO2, Ven: 24.5 mmHg — ABNORMAL LOW (ref 44.0–60.0)
pH, Ven: 7.524 — ABNORMAL HIGH (ref 7.250–7.430)
pO2, Ven: <31 mmHg — CL (ref 32.0–45.0)

## 2019-08-19 LAB — TROPONIN I (HIGH SENSITIVITY)
Troponin I (High Sensitivity): 26 ng/L — ABNORMAL HIGH (ref <18)
Troponin I (High Sensitivity): 21 ng/L — ABNORMAL HIGH (ref <18)

## 2019-08-19 LAB — SARS CORONAVIRUS 2 BY RT PCR (HOSPITAL ORDER, PERFORMED IN ~~LOC~~ HOSPITAL LAB): SARS Coronavirus 2: NEGATIVE

## 2019-08-19 LAB — CBG MONITORING, ED: Glucose-Capillary: 228 mg/dL — ABNORMAL HIGH (ref 70–99)

## 2019-08-19 LAB — LIPASE, BLOOD: Lipase: 34 U/L (ref 11–51)

## 2019-08-19 LAB — I-STAT BETA HCG BLOOD, ED (MC, WL, AP ONLY): I-stat hCG, quantitative: <5 m[IU]/mL (ref <5)

## 2019-08-19 LAB — BRAIN NATRIURETIC PEPTIDE: B Natriuretic Peptide: 60.8 pg/mL (ref 0.0–100.0)

## 2019-08-19 LAB — MAGNESIUM: Magnesium: 1.2 mg/dL — ABNORMAL LOW (ref 1.7–2.4)

## 2019-08-19 LAB — GLUCOSE, CAPILLARY: Glucose-Capillary: 220 mg/dL — ABNORMAL HIGH (ref 70–99)

## 2019-08-19 MED ORDER — SODIUM CHLORIDE 0.9% FLUSH: 3.0000 mL | Freq: Once | INTRAVENOUS | Status: DC

## 2019-08-19 MED ORDER — LORATADINE 10 MG PO TABS
10.0000 mg | ORAL_TABLET | Freq: Every day | ORAL | Status: DC
  Administered 2019-08-20 – 2019-08-31 (×12): 10 mg via ORAL
  Filled 2019-08-19 (×12): qty 1

## 2019-08-19 MED ORDER — APIXABAN 5 MG PO TABS
5.0000 mg | ORAL_TABLET | Freq: Two times a day (BID) | ORAL | Status: DC
  Administered 2019-08-19 – 2019-08-24 (×10): 5 mg via ORAL
  Filled 2019-08-19 (×11): qty 1

## 2019-08-19 MED ORDER — MAGNESIUM OXIDE 400 (241.3 MG) MG PO TABS
400.0000 mg | ORAL_TABLET | Freq: Two times a day (BID) | ORAL | Status: DC
  Administered 2019-08-19 – 2019-08-20 (×2): 400 mg via ORAL
  Filled 2019-08-19 (×2): qty 1

## 2019-08-19 MED ORDER — INSULIN GLARGINE 100 UNIT/ML ~~LOC~~ SOLN
45.0000 [IU] | Freq: Every day | SUBCUTANEOUS | Status: DC
  Administered 2019-08-19: 45 [IU] via SUBCUTANEOUS
  Filled 2019-08-19: qty 0.45

## 2019-08-19 MED ORDER — POTASSIUM CHLORIDE 20 MEQ PO PACK
40.0000 meq | PACK | Freq: Once | ORAL | Status: DC
  Filled 2019-08-19: qty 2

## 2019-08-19 MED ORDER — METOPROLOL SUCCINATE ER 25 MG PO TB24
25.0000 mg | ORAL_TABLET | Freq: Every day | ORAL | Status: DC
  Administered 2019-08-19 – 2019-08-21 (×3): 25 mg via ORAL
  Filled 2019-08-19 (×3): qty 1

## 2019-08-19 MED ORDER — ALBUTEROL SULFATE (2.5 MG/3ML) 0.083% IN NEBU: 2.5000 mg | INHALATION_SOLUTION | Freq: Four times a day (QID) | RESPIRATORY_TRACT | Status: DC | PRN

## 2019-08-19 MED ORDER — ATORVASTATIN CALCIUM 10 MG PO TABS
10.0000 mg | ORAL_TABLET | Freq: Every day | ORAL | Status: DC
  Administered 2019-08-20 – 2019-08-31 (×12): 10 mg via ORAL
  Filled 2019-08-19 (×12): qty 1

## 2019-08-19 MED ORDER — ALPRAZOLAM 1 MG PO TABS
1.0000 mg | ORAL_TABLET | Freq: Two times a day (BID) | ORAL | Status: DC | PRN
  Administered 2019-08-20 – 2019-08-21 (×2): 1 mg via ORAL
  Filled 2019-08-19 (×2): qty 1

## 2019-08-19 MED ORDER — ONDANSETRON HCL 4 MG/2ML IJ SOLN
4.0000 mg | Freq: Four times a day (QID) | INTRAMUSCULAR | Status: DC | PRN
  Administered 2019-08-19 – 2019-08-21 (×6): 4 mg via INTRAVENOUS
  Filled 2019-08-19 (×6): qty 2

## 2019-08-19 MED ORDER — DICYCLOMINE HCL 20 MG PO TABS
20.0000 mg | ORAL_TABLET | Freq: Three times a day (TID) | ORAL | Status: DC | PRN
  Filled 2019-08-19: qty 1

## 2019-08-19 MED ORDER — ISOSORBIDE MONONITRATE ER 60 MG PO TB24
60.0000 mg | ORAL_TABLET | Freq: Every day | ORAL | Status: DC
  Administered 2019-08-19 – 2019-08-21 (×3): 60 mg via ORAL
  Filled 2019-08-19 (×3): qty 1

## 2019-08-19 MED ORDER — MORPHINE SULFATE (PF) 2 MG/ML IV SOLN
2.0000 mg | INTRAVENOUS | Status: DC | PRN
  Administered 2019-08-19 – 2019-08-20 (×6): 2 mg via INTRAVENOUS
  Filled 2019-08-19 (×7): qty 1

## 2019-08-19 MED ORDER — ENOXAPARIN SODIUM 40 MG/0.4ML ~~LOC~~ SOLN: 40.0000 mg | SUBCUTANEOUS | Status: DC

## 2019-08-19 MED ORDER — INSULIN ASPART 100 UNIT/ML ~~LOC~~ SOLN
0.0000 [IU] | Freq: Three times a day (TID) | SUBCUTANEOUS | Status: DC
  Administered 2019-08-19: 7 [IU] via SUBCUTANEOUS
  Administered 2019-08-20: 3 [IU] via SUBCUTANEOUS
  Administered 2019-08-20 (×2): 7 [IU] via SUBCUTANEOUS
  Administered 2019-08-20 – 2019-08-21 (×3): 4 [IU] via SUBCUTANEOUS
  Administered 2019-08-21: 7 [IU] via SUBCUTANEOUS
  Administered 2019-08-21: 11 [IU] via SUBCUTANEOUS
  Administered 2019-08-22 (×2): 4 [IU] via SUBCUTANEOUS
  Administered 2019-08-22 (×2): 7 [IU] via SUBCUTANEOUS
  Administered 2019-08-23: 15 [IU] via SUBCUTANEOUS
  Administered 2019-08-23: 4 [IU] via SUBCUTANEOUS
  Administered 2019-08-23 (×2): 7 [IU] via SUBCUTANEOUS
  Administered 2019-08-24 – 2019-08-25 (×5): 4 [IU] via SUBCUTANEOUS
  Administered 2019-08-25 – 2019-08-26 (×2): 11 [IU] via SUBCUTANEOUS
  Administered 2019-08-26 (×2): 3 [IU] via SUBCUTANEOUS
  Administered 2019-08-26: 15 [IU] via SUBCUTANEOUS
  Administered 2019-08-27: 3 [IU] via SUBCUTANEOUS
  Administered 2019-08-27 (×2): 15 [IU] via SUBCUTANEOUS
  Administered 2019-08-28: 7 [IU] via SUBCUTANEOUS
  Administered 2019-08-28 (×2): 11 [IU] via SUBCUTANEOUS
  Administered 2019-08-28: 4 [IU] via SUBCUTANEOUS
  Administered 2019-08-29: 15 [IU] via SUBCUTANEOUS
  Administered 2019-08-29: 4 [IU] via SUBCUTANEOUS
  Administered 2019-08-29: 7 [IU] via SUBCUTANEOUS
  Administered 2019-08-29 – 2019-08-30 (×2): 15 [IU] via SUBCUTANEOUS
  Administered 2019-08-30: 20 [IU] via SUBCUTANEOUS
  Administered 2019-08-30: 15 [IU] via SUBCUTANEOUS
  Administered 2019-08-31: 7 [IU] via SUBCUTANEOUS
  Administered 2019-08-31: 4 [IU] via SUBCUTANEOUS
  Filled 2019-08-19: qty 0.2

## 2019-08-19 MED ORDER — POTASSIUM CHLORIDE CRYS ER 20 MEQ PO TBCR
40.0000 meq | EXTENDED_RELEASE_TABLET | Freq: Once | ORAL | Status: AC
  Administered 2019-08-19: 40 meq via ORAL
  Filled 2019-08-19: qty 2

## 2019-08-19 MED ORDER — NITROGLYCERIN 0.4 MG SL SUBL: 0.4000 mg | SUBLINGUAL_TABLET | SUBLINGUAL | Status: DC | PRN

## 2019-08-19 MED ORDER — PANTOPRAZOLE SODIUM 40 MG IV SOLR
40.0000 mg | Freq: Two times a day (BID) | INTRAVENOUS | Status: DC
  Administered 2019-08-19 – 2019-08-20 (×3): 40 mg via INTRAVENOUS
  Filled 2019-08-19 (×4): qty 40

## 2019-08-19 MED ORDER — HYDROMORPHONE HCL 1 MG/ML IJ SOLN
1.0000 mg | Freq: Once | INTRAMUSCULAR | Status: AC
  Administered 2019-08-19: 1 mg via INTRAVENOUS
  Filled 2019-08-19: qty 1

## 2019-08-19 MED ORDER — FLUTICASONE PROPIONATE 50 MCG/ACT NA SUSP: 2.0000 | Freq: Every day | NASAL | Status: DC | PRN

## 2019-08-19 MED ORDER — HYDRALAZINE HCL 25 MG PO TABS
25.0000 mg | ORAL_TABLET | Freq: Three times a day (TID) | ORAL | Status: DC
  Administered 2019-08-19 – 2019-08-21 (×5): 25 mg via ORAL
  Filled 2019-08-19 (×5): qty 1

## 2019-08-19 MED ORDER — MELATONIN 3 MG PO TABS
6.0000 mg | ORAL_TABLET | Freq: Every day | ORAL | Status: DC
  Administered 2019-08-19 – 2019-08-30 (×12): 6 mg via ORAL
  Filled 2019-08-19 (×12): qty 2

## 2019-08-19 MED ORDER — ONDANSETRON HCL 4 MG/2ML IJ SOLN
4.0000 mg | Freq: Once | INTRAMUSCULAR | Status: AC
  Administered 2019-08-19: 4 mg via INTRAVENOUS
  Filled 2019-08-19: qty 2

## 2019-08-19 MED ORDER — ONDANSETRON 4 MG PO TBDP
4.0000 mg | ORAL_TABLET | Freq: Three times a day (TID) | ORAL | Status: DC | PRN
  Administered 2019-08-22: 4 mg via ORAL
  Filled 2019-08-19: qty 1

## 2019-08-19 MED ORDER — SODIUM CHLORIDE 0.9 % IV BOLUS
1000.0000 mL | Freq: Once | INTRAVENOUS | Status: AC
  Administered 2019-08-19: 1000 mL via INTRAVENOUS

## 2019-08-19 MED ORDER — ONDANSETRON HCL 4 MG PO TABS
4.0000 mg | ORAL_TABLET | Freq: Four times a day (QID) | ORAL | Status: DC | PRN
  Administered 2019-08-23: 4 mg via ORAL
  Filled 2019-08-19: qty 1

## 2019-08-19 MED ORDER — POTASSIUM CHLORIDE IN NACL 20-0.9 MEQ/L-% IV SOLN
INTRAVENOUS | Status: DC
  Filled 2019-08-19 (×5): qty 1000

## 2019-08-19 MED ORDER — LOPERAMIDE HCL 2 MG PO CAPS
2.0000 mg | ORAL_CAPSULE | Freq: Four times a day (QID) | ORAL | Status: DC | PRN
  Filled 2019-08-19: qty 1

## 2019-08-19 MED ORDER — DULOXETINE HCL 20 MG PO CPEP
40.0000 mg | ORAL_CAPSULE | Freq: Every day | ORAL | Status: DC
  Administered 2019-08-20 – 2019-08-31 (×12): 40 mg via ORAL
  Filled 2019-08-19 (×12): qty 2

## 2019-08-19 MED ORDER — TRAZODONE HCL 50 MG PO TABS
25.0000 mg | ORAL_TABLET | Freq: Every evening | ORAL | Status: DC | PRN
  Administered 2019-08-22 – 2019-08-31 (×4): 25 mg via ORAL
  Filled 2019-08-19 (×4): qty 1

## 2019-08-19 MED ORDER — MAGNESIUM HYDROXIDE 400 MG/5ML PO SUSP: 30.0000 mL | Freq: Every day | ORAL | Status: DC | PRN

## 2019-08-19 NOTE — ED Provider Notes (Signed)
[facility] DEPT Provider Note   [name]: [id number] Arrival date & time: 08/19/19  1527     History Chief Complaint  Patient presents with  . [name]  . Chest Pain  . Emesis    Deborah [name] is a 54 y.o. female w/ pmxhx of CVA, systolic CHF, parox A Fib on eliquis, HTN, obesity, gout, anemia of chronic disease, DM type 2 with recurrent DKA, gastroparesis, neuroendocrine tumor, presented emergency department with nausea and vomiting.  Patient is also reporting chest pain or shortness of breath.  She reports onset of symptoms yesterday.  Per medical record review she does have a history of recurrent hospitalizations for gastroparesis and DKA.  She was most recently discharged from the hospital proximately 10 days ago for similar situation, noted to have chronic abdominal pain with uncontrolled underlying diabetes.    Today, she has multiple complaints.  She tells me she has worsening abdominal pain, which she says she has never had before.  She feels like she been vomiting since yesterday.  She also reports left-sided chest pain that radiates down her left arm, which she states is new.  She feels very short of breath as well.  She has a hx of tardive dyskinesia and is not to have Reglan  HPI     Past Medical History:  Diagnosis Date  . Acute back pain with sciatica, left   . Acute back pain with sciatica, right   . AKI (acute kidney injury) ([place])   . Anemia, unspecified   . Chronic pain   . Chronic systolic CHF (congestive heart failure) ([place])   . Diabetes mellitus   . Esophageal reflux   . Fibromyalgia   . Gastric ulcer   . Gastroparesis   . Gout   . Hyperlipidemia   . Hypertension   . Lumbosacral stenosis   . NICM (nonischemic cardiomyopathy) ([place])   . Obesity   . PAF (paroxysmal atrial fibrillation) ([place])   . Stroke ([place]) 02/2011  . Vitamin B12 deficiency anemia     Patient Active Problem List   Diagnosis Date Noted  .  Malignant carcinoid tumor of duodenum ([place])   . Hyperosmolar hyperglycemic state (HHS) ([place]) 07/22/2019  . Drug-seeking behavior 07/22/2019  . Lactic acidosis 07/10/2019  . Nonalcoholic fatty liver disease 06/05/2019  . Chronic diarrhea   . COPD exacerbation ([place]) 05/08/2019  . Acute on chronic HFrEF (heart failure with reduced ejection fraction) ([place])   . High risk social situation   . Restrictive lung disease secondary to obesity   . Physical deconditioning   . [name] 05/07/2019  . History of gastric ulcer   . Uncontrolled type 2 diabetes mellitus with hyperglycemia ([place])   . Fibroid uterus 02/23/2019  . Congestion of nasal sinus 01/24/2019  . Chronic systolic CHF (congestive heart failure) ([place]) 12/19/2018  . Intractable nausea and vomiting 12/17/2018  . Hypoxia 12/17/2018  . Hyperglycemia 12/17/2018  . Elevated troponin I level   . Vomiting 07/18/2018  . Abdominal pain 07/17/2018  . Hyperkalemia 07/17/2018  . Cardiac arrest ([place]) 11/29/2017  . Pelvic mass 11/29/2017  . Leukocytosis 11/29/2017  . Anxiety 11/29/2017  . Allergic reaction to contrast media, initial encounter 11/28/2017  . Palliative care encounter   . Back pain 03/19/2017  . Stroke (cerebrum) ([place]) 03/19/2017  . GERD (gastroesophageal reflux disease) 03/19/2017  . Depression 03/19/2017  . Morbid obesity ([place])   . Urinary tract infection 08/16/2016  . Normocytic normochromic anemia 08/16/2016  .  Gastroparesis 08/16/2016  . Intractable vomiting with nausea 06/17/2016  . Diabetic gastroparesis ([place]) 06/05/2016  . Gout 06/05/2016  . AKI (acute kidney injury) ([place]) 12/06/2015  . Chest pain 09/26/2015  . Hypokalemia 09/26/2015  . Hypomagnesemia 09/26/2015  . Nausea and vomiting 08/20/2015  . Gout flare 05/27/2015  . Lower abdominal pain 05/26/2015  . DKA (diabetic ketoacidoses) ([place]) 05/25/2015  . Uncontrolled type 2 diabetes mellitus with diabetic neuropathy, with long-term current use of  insulin ([place]) 05/25/2015  . Type 2 diabetes mellitus with hyperglycemia, with long-term current use of insulin ([place]) 05/25/2015  . CKD (chronic kidney disease), stage II 05/25/2015  . Essential hypertension, benign 09/28/2013    Past Surgical History:  Procedure Laterality Date  . BIOPSY  07/27/2019   Procedure: BIOPSY;  Surgeon: Clarene Essex, MD;  Location: [facility];  Service: Endoscopy;;  . BIOPSY  07/30/2019   Procedure: BIOPSY;  Surgeon: Otis Brace, MD;  Location: [facility];  Service: Gastroenterology;;  . CATARACT EXTRACTION  01/2014  . CHOLECYSTECTOMY    . COLONOSCOPY WITH PROPOFOL N/A 07/30/2019   Procedure: COLONOSCOPY WITH PROPOFOL;  Surgeon: Otis Brace, MD;  Location: [facility];  Service: Gastroenterology;  Laterality: N/A;  . ESOPHAGOGASTRODUODENOSCOPY N/A 07/27/2019   Procedure: ESOPHAGOGASTRODUODENOSCOPY (EGD);  Surgeon: Clarene Essex, MD;  Location: [facility];  Service: Endoscopy;  Laterality: N/A;  . ESOPHAGOGASTRODUODENOSCOPY (EGD) WITH PROPOFOL N/A 08/02/2019   Procedure: ESOPHAGOGASTRODUODENOSCOPY (EGD) WITH PROPOFOL;  Surgeon: Otis Brace, MD;  Location: [facility];  Service: Gastroenterology;  Laterality: N/A;  . HEMOSTASIS CLIP PLACEMENT  08/02/2019   Procedure: HEMOSTASIS CLIP PLACEMENT;  Surgeon: Otis Brace, MD;  Location: [facility];  Service: Gastroenterology;;  . POLYPECTOMY  07/30/2019   Procedure: POLYPECTOMY;  Surgeon: Otis Brace, MD;  Location: [facility];  Service: Gastroenterology;;  . POLYPECTOMY  08/02/2019   Procedure: POLYPECTOMY;  Surgeon: Otis Brace, MD;  Location: [facility];  Service: Gastroenterology;;     OB History   No obstetric history on file.     Family History  Problem Relation Age of Onset  . Diabetes Mother   . Diabetes Father   . Heart disease Father   . Diabetes Sister   . Congestive Heart Failure Sister 53  . Diabetes Brother     Social History   Tobacco Use  . Smoking  status: Never Smoker  . Smokeless tobacco: Never Used  Vaping Use  . Vaping Use: Never used  Substance Use Topics  . Alcohol use: No  . Drug use: No    Home Medications Prior to Admission medications   Medication Sig Start Date End Date Taking? Authorizing Provider  albuterol (PROVENTIL) (2.5 MG/3ML) 0.083% nebulizer solution Take 3 mLs (2.5 mg total) by nebulization every 6 (six) hours as needed for wheezing or shortness of breath. 04/06/19   Scot Jun, FNP  ALPRAZolam Duanne Moron) 1 MG tablet Take 1 tablet (1 mg total) by mouth 2 (two) times daily as needed for anxiety. 08/09/19   Rai, Vernelle Emerald, MD  atorvastatin (LIPITOR) 10 MG tablet Take 1 tablet (10 mg total) by mouth daily. 12/13/18   [name], DO  blood glucose meter kit and supplies KIT Dispense based on patient and insurance preference. Use up to four times daily as directed. (FOR ICD-9 250.00, 250.01). 12/13/18   [name], DO  cetirizine (ZYRTEC) 10 MG tablet Take 1 tablet (10 mg total) by mouth daily. 12/13/18   [name], DO  dicyclomine (BENTYL) 20 MG tablet TAKE 1 TABLET (20 MG  TOTAL) BY MOUTH 3 (THREE) TIMES DAILY AS NEEDED FOR SPASMS (ABDOMINAL CRAMPING). Patient not taking: Reported on 06/29/2019 06/22/19 07/22/19  [name], DO  dicyclomine (BENTYL) 20 MG tablet Take 1 tablet (20 mg total) by mouth 3 (three) times daily as needed for spasms (abdominal cramping). 08/09/19 09/08/19  Rai, Ripudeep K, MD  DULoxetine HCl 40 MG CPEP TAKE ONE CAPSULE BY MOUTH TWICE DAILY Patient taking differently: Take 40 mg by mouth in the morning and at bedtime.  06/04/19   Lockamy, Timothy, DO  ELIQUIS 5 MG TABS tablet TAKE 1 TABLET (5 MG TOTAL) BY MOUTH 2 (TWO) TIMES DAILY. Patient taking differently: Take 5 mg by mouth 2 (two) times daily.  07/19/19   Lockamy, Christia Reading, DO  fluticasone (FLONASE) 50 MCG/ACT nasal spray Place 2 sprays into both nostrils daily as needed for allergies or rhinitis. 12/19/18   Rai, Vernelle Emerald,  MD  hydrALAZINE (APRESOLINE) 25 MG tablet Take 1 tablet (25 mg total) by mouth 3 (three) times daily. For blood pressure 07/19/19 10/17/19  Lockamy, Timothy, DO  insulin aspart (NOVOLOG FLEXPEN) 100 UNIT/ML FlexPen Inject 15 Units into the skin 3 (three) times daily with meals. Patient taking differently: Inject 30 Units into the skin 3 (three) times daily with meals.  06/26/19   Mariel Aloe, MD  isosorbide mononitrate (IMDUR) 60 MG 24 hr tablet Take 1 tablet (60 mg total) by mouth daily. 07/05/19   Eugenie Filler, MD  LANTUS SOLOSTAR 100 UNIT/ML Solostar Pen Inject 45 Units into the skin at bedtime. 06/14/19   Pokhrel, Corrie Mckusick, MD  loperamide (IMODIUM) 2 MG capsule Take 2 mg by mouth 4 (four) times daily as needed for diarrhea or loose stools.    [provider]  magnesium oxide (MAG-OX) 400 (241.3 Mg) MG tablet Take 1 tablet (400 mg total) by mouth 2 (two) times daily. 06/14/19   Pokhrel, Corrie Mckusick, MD  Melatonin 3 MG TABS Take 2 tablets (6 mg total) by mouth at bedtime. 12/13/18   [name], DO  metoprolol succinate (TOPROL-XL) 25 MG 24 hr tablet Take 25 mg by mouth daily. 06/01/19   [provider]  nitroGLYCERIN (NITROSTAT) 0.4 MG SL tablet Place 1 tablet (0.4 mg total) under the tongue every 5 (five) minutes as needed for chest pain. 12/13/18   Lockamy, Christia Reading, DO  ondansetron (ZOFRAN-ODT) 4 MG disintegrating tablet Take 1 tablet (4 mg total) by mouth every 8 (eight) hours as needed for nausea or vomiting. 08/09/19   Rai, Vernelle Emerald, MD  pantoprazole (PROTONIX) 40 MG tablet Take 1 tablet (40 mg total) by mouth 2 (two) times daily before a meal. 08/09/19   Rai, Ripudeep K, MD  torsemide (DEMADEX) 20 MG tablet Take 2 tablets (40 mg total) by mouth daily. Hold if having significant vomiting 07/06/19   Eugenie Filler, MD    Allergies    Contrast media [iodinated diagnostic agents], Diazepam, Isovue [iopamidol], Lisinopril, Penicillins, Acetaminophen, Tolmetin, Aspirin,  Metoclopramide, Nsaids, and Tramadol  Review of Systems   Review of Systems  Constitutional: Negative for chills and fever.  HENT: Negative for ear pain and sore throat.   Eyes: Negative for pain and visual disturbance.  Respiratory: Positive for chest tightness and shortness of breath. Negative for cough.   Cardiovascular: Positive for chest pain. Negative for palpitations.  Gastrointestinal: Positive for abdominal pain, nausea and vomiting.  Genitourinary: Negative for dysuria and hematuria.  Musculoskeletal: Positive for myalgias. Negative for back pain.  Skin: Negative for color change and  rash.  Neurological: Positive for light-headedness. Negative for syncope.  All other systems reviewed and are negative.   Physical Exam Updated Vital Signs BP (!) 162/100 (BP Location: Right Arm)   Pulse (!) 114   Temp (!) 97.4 F (36.3 C) (Oral)   Resp 20   LMP 10/10/2012   SpO2 100%   Physical Exam Vitals and nursing note reviewed.  Constitutional:      General: She is not in acute distress.    Appearance: She is well-developed. She is obese.  HENT:     Head: Normocephalic and atraumatic.     Comments: Flickering of tongue, hx of tardive dyskinesia Eyes:     Conjunctiva/sclera: Conjunctivae normal.  Cardiovascular:     Rate and Rhythm: Normal rate and regular rhythm.     Heart sounds: No murmur heard.   Pulmonary:     Effort: No respiratory distress.     Comments: Breathing heavily No wheezing on exam, no crackles noted on pulmonary exam Abdominal:     Palpations: Abdomen is soft.     Tenderness: There is no abdominal tenderness. There is no guarding.  Musculoskeletal:     Cervical back: Neck supple.  Skin:    General: Skin is warm and dry.  Neurological:     General: No focal deficit present.     Mental Status: She is alert and oriented to person, place, and time.     ED Results / Procedures / Treatments   Labs (all labs ordered are listed, but only abnormal  results are displayed) Labs Reviewed  CBC WITH DIFFERENTIAL/PLATELET - Abnormal; Notable for the following components:      Result Value   WBC 11.7 (*)    Hemoglobin 11.1 (*)    HCT 35.4 (*)    Platelets 436 (*)    Neutro Abs 8.9 (*)    All other components within normal limits  BLOOD GAS, VENOUS - Abnormal; Notable for the following components:   pH, Ven 7.524 (*)    pCO2, Ven 24.5 (*)    pO2, Ven <31.0 (*)    All other components within normal limits  COMPREHENSIVE METABOLIC PANEL - Abnormal; Notable for the following components:   Potassium 3.1 (*)    CO2 20 (*)    Glucose, Bld 218 (*)    BUN 27 (*)    Creatinine, Ser 1.61 (*)    Total Protein 8.3 (*)    GFR calc non Af Amer 36 (*)    GFR calc Af Amer 41 (*)    All other components within normal limits  MAGNESIUM - Abnormal; Notable for the following components:   Magnesium 1.2 (*)    All other components within normal limits  GLUCOSE, CAPILLARY - Abnormal; Notable for the following components:   Glucose-Capillary 220 (*)    All other components within normal limits  CBG MONITORING, ED - Abnormal; Notable for the following components:   Glucose-Capillary 228 (*)    All other components within normal limits  TROPONIN I (HIGH SENSITIVITY) - Abnormal; Notable for the following components:   Troponin I (High Sensitivity) 26 (*)    All other components within normal limits  TROPONIN I (HIGH SENSITIVITY) - Abnormal; Notable for the following components:   Troponin I (High Sensitivity) 21 (*)    All other components within normal limits  SARS CORONAVIRUS 2 BY RT PCR (HOSPITAL ORDER, [facility])  BRAIN NATRIURETIC PEPTIDE  LIPASE, BLOOD  BASIC METABOLIC PANEL  CBC  I-STAT BETA HCG BLOOD, ED ([facility], [facility], [facility] ONLY)    EKG EKG Interpretation  Date/Time:  Sunday August 19 2019 15:33:59 EDT Ventricular Rate:  116 PR Interval:    QRS Duration: 86 QT Interval:  337 QTC Calculation: 469 R  Axis:   -35 Text Interpretation: Sinus tachycardia Left axis deviation 12 Lead; Mason-Likar No sig change from prior ecg No STEMI Confirmed by Octaviano Glow [phone]) on 08/19/2019 3:46:27 PM   Radiology DG Chest 2 View  Result Date: 08/19/2019 CLINICAL DATA:  Chest pain EXAM: CHEST - 2 VIEW COMPARISON:  07/21/2019 FINDINGS: Cardiomegaly. Both lungs are clear. Disc degenerative disease of the thoracic spine. IMPRESSION: Cardiomegaly without acute abnormality of the lungs. Electronically Signed   By: Eddie Candle M.D.   On: 08/19/2019 15:56    Procedures Procedures (including critical care time)  Medications Ordered in ED Medications  sodium chloride flush (NS) 0.9 % injection 3 mL (0 mLs Intravenous Hold 08/19/19 2052)  atorvastatin (LIPITOR) tablet 10 mg (has no administration in time range)  hydrALAZINE (APRESOLINE) tablet 25 mg (25 mg Oral Given 08/19/19 2230)  isosorbide mononitrate (IMDUR) 24 hr tablet 60 mg (60 mg Oral Given 08/19/19 2047)  metoprolol succinate (TOPROL-XL) 24 hr tablet 25 mg (25 mg Oral Given 08/19/19 2029)  ALPRAZolam (XANAX) tablet 1 mg (has no administration in time range)  DULoxetine (CYMBALTA) DR capsule 40 mg (has no administration in time range)  insulin glargine (LANTUS) injection 45 Units (45 Units Subcutaneous Given 08/19/19 2202)  dicyclomine (BENTYL) tablet 20 mg (has no administration in time range)  loperamide (IMODIUM) capsule 2 mg (has no administration in time range)  ondansetron (ZOFRAN-ODT) disintegrating tablet 4 mg (has no administration in time range)  apixaban (ELIQUIS) tablet 5 mg (5 mg Oral Given 08/19/19 2201)  melatonin tablet 6 mg (6 mg Oral Given 08/19/19 2201)  magnesium oxide (MAG-OX) tablet 400 mg (400 mg Oral Given 08/19/19 2201)  albuterol (PROVENTIL) (2.5 MG/3ML) 0.083% nebulizer solution 2.5 mg (has no administration in time range)  loratadine (CLARITIN) tablet 10 mg (has no administration in time range)  fluticasone (FLONASE) 50  MCG/ACT nasal spray 2 spray (has no administration in time range)  pantoprazole (PROTONIX) injection 40 mg (40 mg Intravenous Given 08/19/19 2159)  insulin aspart (novoLOG) injection 0-20 Units (7 Units Subcutaneous Given 08/19/19 2201)  0.9 % NaCl with KCl 20 mEq/ L  infusion ( Intravenous New Bag/Given 08/19/19 1922)  morphine 2 MG/ML injection 2 mg (2 mg Intravenous Given 08/19/19 2155)  traZODone (DESYREL) tablet 25 mg (has no administration in time range)  magnesium hydroxide (MILK OF MAGNESIA) suspension 30 mL (has no administration in time range)  ondansetron (ZOFRAN) tablet 4 mg ( Oral See Alternative 08/19/19 2158)    Or  ondansetron (ZOFRAN) injection 4 mg (4 mg Intravenous Given 08/19/19 2158)  nitroGLYCERIN (NITROSTAT) SL tablet 0.4 mg (has no administration in time range)  HYDROmorphone (DILAUDID) injection 1 mg (1 mg Intravenous Given 08/19/19 1729)  ondansetron (ZOFRAN) injection 4 mg (4 mg Intravenous Given 08/19/19 1729)  sodium chloride 0.9 % bolus 1,000 mL (0 mLs Intravenous Stopped 08/19/19 2019)  HYDROmorphone (DILAUDID) injection 1 mg (1 mg Intravenous Given 08/19/19 1834)  potassium chloride SA (KLOR-CON) CR tablet 40 mEq (40 mEq Oral Given 08/19/19 2201)    ED Course  I have reviewed the triage vital signs and the nursing notes.  Pertinent labs & imaging results that were available during my care of the patient were reviewed by  me and considered in my medical decision making (see chart for details).  73 female present emergency department nausea, vomiting, epigastric pain, as well as chest pain and shortness of breath.  She reports onset yesterday evening.  She tells me she has never had these symptoms before, but review of her medical record shows recurrent ED visits and hospitalizations for nausea, vomiting and abdominal pain, gastroparesis and DKA.  Appears to have a history of noncompliance with her medications.    She has other medical comorbidities including congestive  heart failure paroxysmal A. fib.  Therefore I included a for work-up to include a cardiac evaluation.  Labs are pending.  EKG on arrival per my interpretation shows sinus tachycardia with no ST elevations.  Labs are personally reviewed including a venous gas showing respiratory alkalosis, likely due to hyperventilation.  CMP with a minor increase in creatinine and BUN, likely from vomiting.  Mild hypokalemia potassium 3.1.  No anion gap.  Doubtful DKA at this time.  Lipase normal.  No significant leukocytosis.  She is afebrile.  I do not believe that she has sepsis at this time.  Suspect that this point that this is gastroparesis.  Will try pain medication as well as Zofran.  I would avoid any QT prolonging medications or any medications that can exacerbate her tardive dyskinesia.    Clinical Course as of Aug 18 2316  Sun Aug 19, 2019  1604 IMPRESSION: Cardiomegaly without acute abnormality of the lungs.    [MT]  7353 VBG with respiratory alkalosis I suspect 2/2 to her hyperventilation   [MT]  1732 No anion gap, no significant hyperglycemia, doubt DKA.  Giving IVF as Cr is mildly elevated here, likely prerenal from vomiting.     [MT]  2992 First trop is 18 but she is chronically elevated in this range, will need 2nd trop.  BNP normal.  Pt getting pain and nausea medications now.   [MT]  1820 Trops flat, doubt ACS   [MT]  1830 Patient continues to complain of abdominal pain.  She feels nauseated.  I asked her if this feels like her gastroparesis that she says "Maybe."  She continues to be writhing on the stretcher.  I do note that she is tachycardic here.  However she was also tachycardic and tachypneic on her prior presentation, I suspect this more likely pain response to the new acute processes pulmonary embolism.  She has no hypoxia, I do not believe this pulmonary embolism at this time.  However given that she has intractable pain and continues to have dry heaving, cannot tolerate  p.o., I will admit again for pain control.   [MT]  4268 Signed out to hospitalist   [MT]    Clinical Course User Index [MT] Brandey Vandalen, Carola Rhine, MD     Final Clinical Impression(s) / ED Diagnoses Final diagnoses:  Gastroparesis  Abdominal pain, unspecified abdominal location  Hypokalemia    Rx / DC Orders ED Discharge Orders    None       Wyvonnia Dusky, MD 08/19/19 2319

## 2019-08-19 NOTE — ED Notes (Signed)
Unable to call report at this time; floor needs another 15 minutes for shift change

## 2019-08-19 NOTE — H&P (Addendum)
[facility] [place] NAME: Deborah [name]    MR#:  [id number]  DATE OF BIRTH:  Jul 26, 1965  DATE OF ADMISSION:  08/19/2019  PRIMARY CARE PHYSICIAN: [name], DO   REQUESTING/REFERRING PHYSICIAN: Trephine, Carola Rhine, MD  CHIEF COMPLAINT:   Chief Complaint  Patient presents with  . [name]  . Chest Pain  . Emesis    HISTORY OF PRESENT ILLNESS:  Deborah [name]  is a 54 y.o. African-American female with a known history of type II obese mellitus, chronic systolic CHF, hypertension, dyslipidemia and GERD as well as gastroparesis, presented to the emergency room with acute onset of intractable vomiting since this morning with associated epigastric and lower abdominal pain.  She denies any fever or chills.  No bilious vomitus or hematemesis.  No melena or bright red bleeding per rectum.  She has been having substernal chest pain felt as pressure and graded 5/10 in severity with radiation to her left arm and associated mild dyspnea without complications.  She denies any cough or wheezing or hemoptysis.  No other bleeding diathesis.  Upon presentation to the emergency room, blood pressure was 153/105 with heart rate of 115 and respirate of 24 and temperature 98.  Labs revealed hypokalemia of 3.1 glucose of 218 with a BUN of 30 And creatinine 1.61 compared to 30/1.28 08/07/2019.  Serum lipase was 34 and BNP 60.8.  High-sensitivity troponin I was 26 and later 21 CBC showed minimal leukocytosis of 11.7 with neutrophilia and anemia better than previous levels.  Her serum pregnancy test came back negative.  Two-view chest x-ray showed cardiomegaly without acute cardiopulmonary disease. EKG showed sinus tachycardia with a rate of 116 with left axis deviation poor R wave progression.  The patient was given Dilaudid 1 mg IV twice as well as 4 mg of IV Zofran and 1 L bolus of IV normal saline.  She was still having recurrent epigastric pain and nausea.  She will  be admitted to an observation telemetry bed for further evaluation and management. PAST MEDICAL HISTORY:   Past Medical History:  Diagnosis Date  . Acute back pain with sciatica, left   . Acute back pain with sciatica, right   . AKI (acute kidney injury) ([place])   . Anemia, unspecified   . Chronic pain   . Chronic systolic CHF (congestive heart failure) ([place])   . Diabetes mellitus   . Esophageal reflux   . Fibromyalgia   . Gastric ulcer   . Gastroparesis   . Gout   . Hyperlipidemia   . Hypertension   . Lumbosacral stenosis   . NICM (nonischemic cardiomyopathy) ([place])   . Obesity   . PAF (paroxysmal atrial fibrillation) ([place])   . Stroke ([place]) 02/2011  . Vitamin B12 deficiency anemia     PAST SURGICAL HISTORY:   Past Surgical History:  Procedure Laterality Date  . BIOPSY  07/27/2019   Procedure: BIOPSY;  Surgeon: Clarene Essex, MD;  Location: [facility];  Service: Endoscopy;;  . BIOPSY  07/30/2019   Procedure: BIOPSY;  Surgeon: Otis Brace, MD;  Location: [facility];  Service: Gastroenterology;;  . CATARACT EXTRACTION  01/2014  . CHOLECYSTECTOMY    . COLONOSCOPY WITH PROPOFOL N/A 07/30/2019   Procedure: COLONOSCOPY WITH PROPOFOL;  Surgeon: Otis Brace, MD;  Location: [facility];  Service: Gastroenterology;  Laterality: N/A;  . ESOPHAGOGASTRODUODENOSCOPY N/A 07/27/2019   Procedure: ESOPHAGOGASTRODUODENOSCOPY (EGD);  Surgeon: Clarene Essex, MD;  Location: [facility];  Service: Endoscopy;  Laterality: N/A;  . ESOPHAGOGASTRODUODENOSCOPY (EGD) WITH PROPOFOL N/A 08/02/2019   Procedure: ESOPHAGOGASTRODUODENOSCOPY (EGD) WITH PROPOFOL;  Surgeon: Otis Brace, MD;  Location: [facility];  Service: Gastroenterology;  Laterality: N/A;  . HEMOSTASIS CLIP PLACEMENT  08/02/2019   Procedure: HEMOSTASIS CLIP PLACEMENT;  Surgeon: Otis Brace, MD;  Location: [facility];  Service: Gastroenterology;;  . POLYPECTOMY  07/30/2019   Procedure: POLYPECTOMY;  Surgeon: Otis Brace, MD;  Location: [facility];  Service: Gastroenterology;;  . POLYPECTOMY  08/02/2019   Procedure: POLYPECTOMY;  Surgeon: Otis Brace, MD;  Location: [facility];  Service: Gastroenterology;;    SOCIAL HISTORY:   Social History   Tobacco Use  . Smoking status: Never Smoker  . Smokeless tobacco: Never Used  Substance Use Topics  . Alcohol use: No    FAMILY HISTORY:   Family History  Problem Relation Age of Onset  . Diabetes Mother   . Diabetes Father   . Heart disease Father   . Diabetes Sister   . Congestive Heart Failure Sister 1  . Diabetes Brother     DRUG ALLERGIES:   Allergies  Allergen Reactions  . Contrast Media [Iodinated Diagnostic Agents] Anaphylaxis    Cardiac arrest  . Diazepam [name]  . Isovue [Iopamidol] Anaphylaxis    Patient had seizure like activity and then code post 100 cc of isovue 300  . Lisinopril Anaphylaxis    Tongue and mouth swelling  . Penicillins Palpitations    Has patient had a PCN reaction causing immediate rash, facial/tongue/throat swelling, SOB or lightheadedness with hypotension: Yes, heart races Has patient had a PCN reaction causing severe rash involving mucus membranes or skin necrosis: No Has patient had a PCN reaction that required hospitalization: Yes  Has patient had a PCN reaction occurring within the last 10 years: No   . Acetaminophen Nausea Only and Other (See Comments)    Irritates stomach ulcer  Abdominal pain  . Tolmetin Nausea Only and Other (See Comments)    ULCER  . Aspirin Other (See Comments)    Irritates stomach ulcer   . Metoclopramide Other (See Comments)    Tardive dyskinesia  . Nsaids Other (See Comments)    ULCER  . Tramadol Nausea And Vomiting    REVIEW OF SYSTEMS:   ROS As per history of present illness. All pertinent systems were reviewed above. Constitutional,  HEENT, cardiovascular, respiratory, GI, GU, musculoskeletal, neuro, psychiatric, endocrine,   integumentary and hematologic systems were reviewed and are otherwise  negative/unremarkable except for positive findings mentioned above in the HPI.   MEDICATIONS AT HOME:   Prior to Admission medications   Medication Sig Start Date End Date Taking? Authorizing Provider  albuterol (PROVENTIL) (2.5 MG/3ML) 0.083% nebulizer solution Take 3 mLs (2.5 mg total) by nebulization every 6 (six) hours as needed for wheezing or shortness of breath. 04/06/19   Scot Jun, FNP  ALPRAZolam Duanne Moron) 1 MG tablet Take 1 tablet (1 mg total) by mouth 2 (two) times daily as needed for anxiety. 08/09/19   Rai, Vernelle Emerald, MD  atorvastatin (LIPITOR) 10 MG tablet Take 1 tablet (10 mg total) by mouth daily. 12/13/18   [name], DO  blood glucose meter kit and supplies KIT Dispense based on patient and insurance preference. Use up to four times daily as directed. (FOR ICD-9 250.00, 250.01). 12/13/18   [name], DO  cetirizine (ZYRTEC) 10 MG tablet Take 1 tablet (10 mg total) by mouth daily. 12/13/18   [name], DO  dicyclomine (  BENTYL) 20 MG tablet TAKE 1 TABLET (20 MG TOTAL) BY MOUTH 3 (THREE) TIMES DAILY AS NEEDED FOR SPASMS (ABDOMINAL CRAMPING). Patient not taking: Reported on 06/29/2019 06/22/19 07/22/19  [name], DO  dicyclomine (BENTYL) 20 MG tablet Take 1 tablet (20 mg total) by mouth 3 (three) times daily as needed for spasms (abdominal cramping). 08/09/19 09/08/19  Rai, Ripudeep K, MD  DULoxetine HCl 40 MG CPEP TAKE ONE CAPSULE BY MOUTH TWICE DAILY Patient taking differently: Take 40 mg by mouth in the morning and at bedtime.  06/04/19   Lockamy, Timothy, DO  ELIQUIS 5 MG TABS tablet TAKE 1 TABLET (5 MG TOTAL) BY MOUTH 2 (TWO) TIMES DAILY. Patient taking differently: Take 5 mg by mouth 2 (two) times daily.  07/19/19   Lockamy, Christia Reading, DO  fluticasone (FLONASE) 50 MCG/ACT nasal spray Place 2 sprays into both nostrils daily as needed for allergies or rhinitis. 12/19/18   Rai, Vernelle Emerald,  MD  hydrALAZINE (APRESOLINE) 25 MG tablet Take 1 tablet (25 mg total) by mouth 3 (three) times daily. For blood pressure 07/19/19 10/17/19  Lockamy, Timothy, DO  insulin aspart (NOVOLOG FLEXPEN) 100 UNIT/ML FlexPen Inject 15 Units into the skin 3 (three) times daily with meals. Patient taking differently: Inject 30 Units into the skin 3 (three) times daily with meals.  06/26/19   Mariel Aloe, MD  isosorbide mononitrate (IMDUR) 60 MG 24 hr tablet Take 1 tablet (60 mg total) by mouth daily. 07/05/19   Eugenie Filler, MD  LANTUS SOLOSTAR 100 UNIT/ML Solostar Pen Inject 45 Units into the skin at bedtime. 06/14/19   Pokhrel, Corrie Mckusick, MD  loperamide (IMODIUM) 2 MG capsule Take 2 mg by mouth 4 (four) times daily as needed for diarrhea or loose stools.    [provider]  magnesium oxide (MAG-OX) 400 (241.3 Mg) MG tablet Take 1 tablet (400 mg total) by mouth 2 (two) times daily. 06/14/19   Pokhrel, Corrie Mckusick, MD  Melatonin 3 MG TABS Take 2 tablets (6 mg total) by mouth at bedtime. 12/13/18   [name], DO  metoprolol succinate (TOPROL-XL) 25 MG 24 hr tablet Take 25 mg by mouth daily. 06/01/19   [provider]  nitroGLYCERIN (NITROSTAT) 0.4 MG SL tablet Place 1 tablet (0.4 mg total) under the tongue every 5 (five) minutes as needed for chest pain. 12/13/18   Lockamy, Christia Reading, DO  ondansetron (ZOFRAN-ODT) 4 MG disintegrating tablet Take 1 tablet (4 mg total) by mouth every 8 (eight) hours as needed for nausea or vomiting. 08/09/19   Rai, Vernelle Emerald, MD  pantoprazole (PROTONIX) 40 MG tablet Take 1 tablet (40 mg total) by mouth 2 (two) times daily before a meal. 08/09/19   Rai, Ripudeep K, MD  torsemide (DEMADEX) 20 MG tablet Take 2 tablets (40 mg total) by mouth daily. Hold if having significant vomiting 07/06/19   Eugenie Filler, MD      VITAL SIGNS:  Blood pressure (!) 166/126, pulse (!) 111, temperature 98 F (36.7 C), resp. rate (!) 22, last menstrual period 10/10/2012, SpO2 100  %.  PHYSICAL EXAMINATION:  Physical Exam  GENERAL:  54 y.o.-year-old American female patient lying in the bed with mild distress from pain.  EYES: Pupils equal, round, reactive to light and accommodation. No scleral icterus. Extraocular muscles intact.  HEENT: Head atraumatic, normocephalic. Oropharynx and nasopharynx clear.  NECK:  Supple, no jugular venous distention. No thyroid enlargement, no tenderness.  LUNGS: Normal breath sounds bilaterally, no wheezing, rales,rhonchi or crepitation. No  use of accessory muscles of respiration.  CARDIOVASCULAR: Regular rate and rhythm, S1, S2 normal. No murmurs, rubs, or gallops.  ABDOMEN: Soft, obese, nondistended with mild epigastric tenderness without rebound tenderness guarding or rigidity.  Bowel sounds present. No organomegaly or mass.  EXTREMITIES: No pedal edema, cyanosis, or clubbing.  NEUROLOGIC: Cranial nerves II through XII are intact. Muscle strength 5/5 in all extremities. Sensation intact. Gait not checked.  PSYCHIATRIC: The patient is alert and oriented x 3.  Normal affect and good eye contact. SKIN: No obvious rash, lesion, or ulcer.   LABORATORY PANEL:   CBC Recent Labs  Lab 08/19/19 1619  WBC 11.7*  HGB 11.1*  HCT 35.4*  PLT 436*   ------------------------------------------------------------------------------------------------------------------  Chemistries  Recent Labs  Lab 08/19/19 1619  NA 141  K 3.1*  CL 107  CO2 20*  GLUCOSE 218*  BUN 27*  CREATININE 1.61*  CALCIUM 9.5  AST 21  ALT 20  ALKPHOS 106  BILITOT 0.8   ------------------------------------------------------------------------------------------------------------------  Cardiac Enzymes No results for input(s): TROPONINI in the last 168 hours. ------------------------------------------------------------------------------------------------------------------  RADIOLOGY:  DG Chest 2 View  Result Date: 08/19/2019 CLINICAL DATA:  Chest pain EXAM:  CHEST - 2 VIEW COMPARISON:  07/21/2019 FINDINGS: Cardiomegaly. Both lungs are clear. Disc degenerative disease of the thoracic spine. IMPRESSION: Cardiomegaly without acute abnormality of the lungs. Electronically Signed   By: Eddie Candle M.D.   On: 08/19/2019 15:56      IMPRESSION AND PLAN:   1.  Diabetic gastroparesis with intractable epigastric pain as well as recurrent nausea and vomiting. -Patient will be admitted to an observation telemetry bed. -We will continue hydration with IV normal saline. -We will place her on as needed IV Zofran and Phenergan.  She has not tolerated Reglan in the past.  It apparently gave her extraparametal symptoms and tachycardia. -We will place her on IV PPI therapy with Protonix. -We will place on clear liquids to be advanced as tolerated tomorrow.  2.  Hypokalemia. -This like secondary to recurrent nausea and vomiting. -She will be replaced I will check a magnesium level.  3.  Acute kidney injury. -This is likely prerenal secondary to volume depletion and dehydration from recurrent nausea and vomiting. -She will be hydrated with IV normal saline and will follow her BMP.  4.  Chest pain, likely atypical. -We will follow serial troponin I's and continue monitoring her.  So far her troponin I is stable. -If this continues despite current management and antinausea therapy as well as PPIs, cardiology consultation can be obtained for cardiac risk stratification.  5. Type 2 diabetes mellitus. -The patient will be placed on supplement coverage with NovoLog and will continue basal coverage.  6.  Hypertension. -We will continue Toprol-XL.  7.  Dyslipidemia. -We will continue statin therapy.  8.  Anxiety. -Continue Xanax.  9. COPD without exacerbation. -We will continue her nebulizer therapy.  10.  DVT prophylaxis. -Subcutaneous Lovenox.  11.  GI prophylaxis. -This was addressed above.   All the records are reviewed and case discussed with ED  provider. The plan of care was discussed in details with the patient (and family). I answered all questions. The patient agreed to proceed with the above mentioned plan. Further management will depend upon hospital course.   CODE STATUS: Full code  Status is: Observation  The patient remains OBS appropriate and will d/c before 2 midnights.  Dispo: The patient is from: Home  Anticipated d/c is to: Home              Anticipated d/c date is: 1 day              Patient currently is not medically stable to d/c.   TOTAL TIME TAKING CARE OF THIS PATIENT: 55 minutes.    Christel Mormon M.D on 08/19/2019 at 6:55 PM  [facility]   From 7 PM-7 AM, contact night-coverage [email or web address]  CC: Primary care physician; [name], DO   Note: This dictation was prepared with Dragon dictation along with smaller phrase technology. Any transcriptional typo errors that result from this process are unintentional.

## 2019-08-19 NOTE — ED Notes (Signed)
Attempted to call report; Julienne, RN is with a patient and unable to take report. She states she will call back as soon as possible

## 2019-08-19 NOTE — ED Triage Notes (Signed)
Pt c/o shob, left sided chest pains that radiates to left arm, vomiting that all started today. Pt on continuous O2 of 3L. Reports doesn't have a portable one at home so came in with no O2.

## 2019-08-20 ENCOUNTER — Ambulatory Visit: Payer: Medicare Other

## 2019-08-20 ENCOUNTER — Other Ambulatory Visit: Payer: Self-pay

## 2019-08-20 DIAGNOSIS — E876 Hypokalemia: Secondary | ICD-10-CM | POA: Diagnosis not present

## 2019-08-20 DIAGNOSIS — R1030 Lower abdominal pain, unspecified: Secondary | ICD-10-CM | POA: Diagnosis present

## 2019-08-20 DIAGNOSIS — R609 Edema, unspecified: Secondary | ICD-10-CM | POA: Diagnosis not present

## 2019-08-20 DIAGNOSIS — I130 Hypertensive heart and chronic kidney disease with heart failure and stage 1 through stage 4 chronic kidney disease, or unspecified chronic kidney disease: Secondary | ICD-10-CM | POA: Diagnosis present

## 2019-08-20 DIAGNOSIS — Z880 Allergy status to penicillin: Secondary | ICD-10-CM | POA: Diagnosis not present

## 2019-08-20 DIAGNOSIS — N184 Chronic kidney disease, stage 4 (severe): Secondary | ICD-10-CM | POA: Diagnosis not present

## 2019-08-20 DIAGNOSIS — F419 Anxiety disorder, unspecified: Secondary | ICD-10-CM | POA: Diagnosis present

## 2019-08-20 DIAGNOSIS — E873 Alkalosis: Secondary | ICD-10-CM | POA: Diagnosis present

## 2019-08-20 DIAGNOSIS — K219 Gastro-esophageal reflux disease without esophagitis: Secondary | ICD-10-CM | POA: Diagnosis present

## 2019-08-20 DIAGNOSIS — N179 Acute kidney failure, unspecified: Secondary | ICD-10-CM | POA: Diagnosis present

## 2019-08-20 DIAGNOSIS — I480 Paroxysmal atrial fibrillation: Secondary | ICD-10-CM | POA: Diagnosis present

## 2019-08-20 DIAGNOSIS — D631 Anemia in chronic kidney disease: Secondary | ICD-10-CM | POA: Diagnosis present

## 2019-08-20 DIAGNOSIS — Z888 Allergy status to other drugs, medicaments and biological substances status: Secondary | ICD-10-CM | POA: Diagnosis not present

## 2019-08-20 DIAGNOSIS — I5043 Acute on chronic combined systolic (congestive) and diastolic (congestive) heart failure: Secondary | ICD-10-CM | POA: Diagnosis present

## 2019-08-20 DIAGNOSIS — I10 Essential (primary) hypertension: Secondary | ICD-10-CM | POA: Diagnosis not present

## 2019-08-20 DIAGNOSIS — Z20822 Contact with and (suspected) exposure to covid-19: Secondary | ICD-10-CM | POA: Diagnosis present

## 2019-08-20 DIAGNOSIS — D72829 Elevated white blood cell count, unspecified: Secondary | ICD-10-CM | POA: Diagnosis present

## 2019-08-20 DIAGNOSIS — J449 Chronic obstructive pulmonary disease, unspecified: Secondary | ICD-10-CM | POA: Diagnosis present

## 2019-08-20 DIAGNOSIS — I428 Other cardiomyopathies: Secondary | ICD-10-CM | POA: Diagnosis present

## 2019-08-20 DIAGNOSIS — E1165 Type 2 diabetes mellitus with hyperglycemia: Secondary | ICD-10-CM | POA: Diagnosis present

## 2019-08-20 DIAGNOSIS — E1143 Type 2 diabetes mellitus with diabetic autonomic (poly)neuropathy: Secondary | ICD-10-CM | POA: Diagnosis present

## 2019-08-20 DIAGNOSIS — Z6841 Body Mass Index (BMI) 40.0 and over, adult: Secondary | ICD-10-CM | POA: Diagnosis not present

## 2019-08-20 DIAGNOSIS — K3184 Gastroparesis: Secondary | ICD-10-CM | POA: Diagnosis present

## 2019-08-20 DIAGNOSIS — D509 Iron deficiency anemia, unspecified: Secondary | ICD-10-CM | POA: Diagnosis present

## 2019-08-20 DIAGNOSIS — E785 Hyperlipidemia, unspecified: Secondary | ICD-10-CM | POA: Diagnosis present

## 2019-08-20 DIAGNOSIS — R000 Tachycardia, unspecified: Secondary | ICD-10-CM | POA: Diagnosis present

## 2019-08-20 LAB — CBC
HCT: 28.9 % — ABNORMAL LOW (ref 36.0–46.0)
Hemoglobin: 9.1 g/dL — ABNORMAL LOW (ref 12.0–15.0)
MCH: 27.4 pg (ref 26.0–34.0)
MCHC: 31.5 g/dL (ref 30.0–36.0)
MCV: 87 fL (ref 80.0–100.0)
Platelets: 348 10*3/uL (ref 150–400)
RBC: 3.32 MIL/uL — ABNORMAL LOW (ref 3.87–5.11)
RDW: 15 % (ref 11.5–15.5)
WBC: 11.8 10*3/uL — ABNORMAL HIGH (ref 4.0–10.5)
nRBC: 0 % (ref 0.0–0.2)

## 2019-08-20 LAB — BASIC METABOLIC PANEL
Anion gap: 10 (ref 5–15)
BUN: 23 mg/dL — ABNORMAL HIGH (ref 6–20)
CO2: 23 mmol/L (ref 22–32)
Calcium: 8.2 mg/dL — ABNORMAL LOW (ref 8.9–10.3)
Chloride: 108 mmol/L (ref 98–111)
Creatinine, Ser: 1.52 mg/dL — ABNORMAL HIGH (ref 0.44–1.00)
GFR calc Af Amer: 44 mL/min — ABNORMAL LOW (ref >60)
GFR calc non Af Amer: 38 mL/min — ABNORMAL LOW (ref >60)
Glucose, Bld: 204 mg/dL — ABNORMAL HIGH (ref 70–99)
Potassium: 3.3 mmol/L — ABNORMAL LOW (ref 3.5–5.1)
Sodium: 141 mmol/L (ref 135–145)

## 2019-08-20 LAB — GLUCOSE, CAPILLARY
Glucose-Capillary: 133 mg/dL — ABNORMAL HIGH (ref 70–99)
Glucose-Capillary: 181 mg/dL — ABNORMAL HIGH (ref 70–99)
Glucose-Capillary: 184 mg/dL — ABNORMAL HIGH (ref 70–99)
Glucose-Capillary: 223 mg/dL — ABNORMAL HIGH (ref 70–99)
Glucose-Capillary: 224 mg/dL — ABNORMAL HIGH (ref 70–99)

## 2019-08-20 LAB — TROPONIN I (HIGH SENSITIVITY): Troponin I (High Sensitivity): 30 ng/L — ABNORMAL HIGH (ref <18)

## 2019-08-20 MED ORDER — HYDROMORPHONE HCL 1 MG/ML IJ SOLN
1.0000 mg | INTRAMUSCULAR | Status: DC | PRN
  Administered 2019-08-20 – 2019-08-24 (×20): 1 mg via INTRAVENOUS
  Filled 2019-08-20 (×21): qty 1

## 2019-08-20 MED ORDER — POTASSIUM CHLORIDE CRYS ER 20 MEQ PO TBCR
40.0000 meq | EXTENDED_RELEASE_TABLET | Freq: Once | ORAL | Status: AC
  Administered 2019-08-20: 40 meq via ORAL
  Filled 2019-08-20: qty 2

## 2019-08-20 MED ORDER — INSULIN GLARGINE 100 UNIT/ML ~~LOC~~ SOLN
30.0000 [IU] | Freq: Every day | SUBCUTANEOUS | Status: DC
  Administered 2019-08-20 – 2019-08-28 (×9): 30 [IU] via SUBCUTANEOUS
  Filled 2019-08-20 (×10): qty 0.3

## 2019-08-20 MED ORDER — MAGNESIUM SULFATE 2 GM/50ML IV SOLN
2.0000 g | Freq: Once | INTRAVENOUS | Status: AC
  Administered 2019-08-20: 2 g via INTRAVENOUS
  Filled 2019-08-20: qty 50

## 2019-08-20 MED ORDER — POTASSIUM CHLORIDE CRYS ER 20 MEQ PO TBCR: 40.0000 meq | EXTENDED_RELEASE_TABLET | Freq: Once | ORAL | Status: DC

## 2019-08-20 MED ORDER — DIPHENHYDRAMINE HCL 25 MG PO CAPS
25.0000 mg | ORAL_CAPSULE | Freq: Every evening | ORAL | Status: DC | PRN
  Administered 2019-08-20 – 2019-08-22 (×2): 25 mg via ORAL
  Filled 2019-08-20 (×2): qty 1

## 2019-08-20 NOTE — Chronic Care Management (AMB) (Signed)
  Care Management   Outreach Note  08/20/2019 Name: Deborah [name] MRN: [id number] DOB: 10/25/1965  Referred by: [name], DO Reason for referral : Care Coordination ([facility]  F/u)   An unsuccessful telephone outreach was attempted today. The patient was referred to the case management team for assistance with care management and care coordination.   Follow Up Plan: A HIPPA compliant phone message was left for the patient providing contact information and requesting a return call.  The care management team will reach out to the patient again over the next 3-5 days.   Lazaro Arms RN, BSN, [facility] Care Management Coordinator [place] Phone: [phone] Fax: [phone]

## 2019-08-20 NOTE — Progress Notes (Signed)
Patient received from ED. Alert and verbally responsive, not in respiratory distress. Vital signs below.   08/19/19 2124  Vitals  Temp (!) 97.4 F (36.3 C)  Temp Source Oral  BP (!) 162/100  MAP (mmHg) 120  BP Location Right Arm  BP Method Automatic  Patient Position (if appropriate) Sitting  Pulse Rate (!) 114  Resp 20  Oxygen Therapy  SpO2 100 %  O2 Device Nasal Cannula  O2 Flow Rate (L/min) 3 L/min

## 2019-08-20 NOTE — Progress Notes (Signed)
Inpatient Diabetes Program Recommendations  AACE/ADA: New Consensus Statement on Inpatient Glycemic Control (2015)  Target Ranges:  Prepandial:   less than 140 mg/dL      Peak postprandial:   less than 180 mg/dL (1-2 hours)      Critically ill patients:  140 - 180 mg/dL   Lab Results  Component Value Date   GLUCAP 223 (H) 08/20/2019   HGBA1C 8.7 (H) 07/22/2019    Review of Glycemic Control  Diabetes history: DM2 Outpatient Diabetes medications: Lantus 45 units QHS, Novolog 30 units tidwc Current orders for Inpatient glycemic control: Lantus 30 units QHS, Novolog 0-20 units tidwc and hs  HgbA1C - 8.7%. Needs tighter glycemic control.  Have spoken with pt 3x in past month regarding importance of improving glycemic control, with hx of gastroparesis. Uses large amount of Novolog at home (30 units tidwc)  Will need meal coverage insulin when diet is advanced.   Inpatient Diabetes Program Recommendations:     Add Novolog 10 units tidwc when pt is eating CHO mod diet and consumes > 50% meal. Do not give if NPO.  Will follow closely.  Thank you. Lorenda Peck, RD, LDN, CDE Inpatient Diabetes Coordinator [phone]

## 2019-08-20 NOTE — Progress Notes (Addendum)
PROGRESS NOTE    Deborah [name]  GUY:[id number] DOB: 16-Jul-1965 DOA: 08/19/2019 PCP: [name], DO   Chief Complaint  Patient presents with  . [name]  . Chest Pain  . Emesis    Brief Narrative:  Deborah [name]  is a 54 y.o. African-American female with a known history of type II obese mellitus, chronic systolic CHF, hypertension, dyslipidemia and GERD as well as gastroparesis, presented to the emergency room with acute onset of intractable vomiting with associated epigastric and lower abdominal pain. Denies hematemesis or malena. Also had chest pain felt as pressure and graded 5/10 in severity with radiation to her left arm and associated mild dyspnea without complications.   Upon presentation to the emergency room, blood pressure was 153/105 with heart rate of 115 and respirate of 24 and temperature 98.  Labs revealed hypokalemia of 3.1 glucose of 218 with a BUN of 30 and creatinine 1.61 compared to 30/1.28 08/07/2019.  Serum lipase was 34 and BNP 60.8.  High-sensitivity troponin I was 26 and later 21. CBC showed minimal leukocytosis of 11.7 with neutrophilia and anemia better than previous levels. Chest x-ray showed cardiomegaly without acute cardiopulmonary disease. EKG showed sinus tachycardia with a rate of 116 with left axis deviation poor R wave progression.  The patient was given Dilaudid 1 mg IV twice as well as 4 mg of IV Zofran and 1 L bolus of IV normal saline.  She was still having recurrent epigastric pain and nausea.   Assessment & Plan:   Active Problems:   Diabetic gastroparesis ([place])  1.  Diabetic gastroparesis with intractable epigastric pain as well as recurrent nausea and vomiting. - Continue hydration with IV normal saline. -Continue as needed IV Zofran and Phenergan.  She has not tolerated Reglan in the past.  It apparently gave her extraparametal symptoms and tachycardia. -She says she is still having epigastric pain and morphine not  helping.  She keeps asking for IV Dilaudid. -Continue Protonix. -On clear liquids to be advanced as tolerated.   2.  Hypokalemia/hypomagnesemia. - Likely secondary to recurrent nausea and vomiting. -Ordered replacement.  Continue to monitor and replace as needed.  3.  Acute kidney injury. -This is likely prerenal secondary to volume depletion and dehydration from recurrent nausea and vomiting. -Continue IV hydration.  Monitor BMP.  4.  Chest pain, likely atypical. -We will follow serial troponin I's and continue monitoring her.  So far her troponin I is stable. -This morning she denies chest pain and is complaining of epigastric abdominal discomfort. -If this continues despite current management and antinausea therapy as well as PPIs, consider cardiology consultation for cardiac risk stratification.  5. Type 2 diabetes mellitus. -Started on NovoLog.  Continue Lantus 45 units subcutaneous nightly.  Continue to monitor Accu-Cheks and adjust dosage as needed.  Patient likely noncompliant with her diabetic diet.  She says she checks her blood glucose only twice at home. -Place consult for diabetes coordinator.  6.  Hypertension. -On Imdur, hydralazine, Toprol-XL.  Continue to monitor blood pressure and adjust medications as needed.  7.  Dyslipidemia. -We will continue statin therapy.  8.  Anxiety. -Continue Xanax.  9. COPD without exacerbation. -We will continue her nebulizer therapy.   DVT prophylaxis: Eliquis Code Status: Full Family Communication: No family at bedside. Disposition:   Status is: Inpatient  Remains inpatient appropriate because:IV treatments appropriate due to intensity of illness or inability to take PO   Dispo: The patient is from: Home  Anticipated d/c is to: Home              Anticipated d/c date is: 2 days              Patient currently is not medically stable to d/c.       Consultants:   None   Procedures:    None   Antimicrobials:   None    Subjective: She is continuing to complain of epigastric pain along with nausea.  Asking for IV Dilaudid.  Potassium, magnesium level.  Denies having any chest pain at this time.  Objective: Vitals:   08/20/19 0015 08/20/19 0403 08/20/19 0718 08/20/19 1230  BP: (!) 152/96 122/69 116/66 110/74  Pulse: (!) 105 99 95 90  Resp: 20 20 18 14   Temp: 98.3 F (36.8 C) (!) 97.3 F (36.3 C) (!) 97.5 F (36.4 C) (!) 97.3 F (36.3 C)  TempSrc: Oral Oral Oral Oral  SpO2: 100% 97% 100% 100%  Weight:      Height:        Intake/Output Summary (Last 24 hours) at 08/20/2019 1505 Last data filed at 08/20/2019 1300 Gross per 24 hour  Intake 2304.69 ml  Output --  Net 2304.69 ml   Filed Weights   08/19/19 2300  Weight: (!) 147.6 kg    Examination:  General exam: Morbidly obese female, awake and oriented, not in any acute distress at this time Respiratory system: Decreased breath sounds lower lobes otherwise clear to auscultation. Respiratory effort normal. Cardiovascular system: S1 & S2 heard, RRR. No murmur. Has pedal edema. Gastrointestinal system: obese female, mild epigastric tenderness without any guarding or rebound. No organomegaly or masses felt. Normal bowel sounds heard. Central nervous system: Alert and oriented. No focal neurological deficits. Extremities: Symmetric 5 x 5 power. Skin: No rashes, lesions or ulcers Psychiatry: Judgement and insight appear normal. Mood & affect appropriate.     Data Reviewed: I have personally reviewed following labs and imaging studies  CBC: Recent Labs  Lab 08/19/19 1619 08/20/19 0853  WBC 11.7* 11.8*  NEUTROABS 8.9*  --   HGB 11.1* 9.1*  HCT 35.4* 28.9*  MCV 84.9 87.0  PLT 436* 630    Basic Metabolic Panel: Recent Labs  Lab 08/19/19 1619 08/19/19 1901 08/20/19 0853  NA 141  --  141  K 3.1*  --  3.3*  CL 107  --  108  CO2 20*  --  23  GLUCOSE 218*  --  204*  BUN 27*  --  23*   CREATININE 1.61*  --  1.52*  CALCIUM 9.5  --  8.2*  MG  --  1.2*  --     GFR: Estimated Creatinine Clearance: 62.5 mL/min (A) (by C-G formula based on SCr of 1.52 mg/dL (H)).  Liver Function Tests: Recent Labs  Lab 08/19/19 1619  AST 21  ALT 20  ALKPHOS 106  BILITOT 0.8  PROT 8.3*  ALBUMIN 3.5    CBG: Recent Labs  Lab 08/19/19 1535 08/19/19 2125 08/20/19 0017 08/20/19 0715 08/20/19 1224  GLUCAP 228* 220* 184* 181* 223*     Recent Results (from the past 240 hour(s))  SARS Coronavirus 2 by RT PCR (hospital order, performed in [facility] hospital lab) Nasopharyngeal Nasopharyngeal Swab     Status: None   Collection Time: 08/19/19  7:23 PM   Specimen: Nasopharyngeal Swab  Result Value Ref Range Status   SARS Coronavirus 2 NEGATIVE NEGATIVE Final    Comment: (NOTE) SARS-CoV-2 target nucleic  acids are NOT DETECTED.  The SARS-CoV-2 RNA is generally detectable in upper and lower respiratory specimens during the acute phase of infection. The lowest concentration of SARS-CoV-2 viral copies this assay can detect is 250 copies / mL. A negative result does not preclude SARS-CoV-2 infection and should not be used as the sole basis for treatment or other patient management decisions.  A negative result may occur with improper specimen collection / handling, submission of specimen other than nasopharyngeal swab, presence of viral mutation(s) within the areas targeted by this assay, and inadequate number of viral copies (<250 copies / mL). A negative result must be combined with clinical observations, patient history, and epidemiological information.  Fact Sheet for Patients:   StrictlyIdeas.no  Fact Sheet for Healthcare Providers: BankingDealers.co.za  This test is not yet approved or  cleared by the [place] FDA and has been authorized for detection and/or diagnosis of SARS-CoV-2 by FDA under an Emergency Use  Authorization (EUA).  This EUA will remain in effect (meaning this test can be used) for the duration of the COVID-19 declaration under Section 564(b)(1) of the Act, 21 U.S.C. section 360bbb-3(b)(1), unless the authorization is terminated or revoked sooner.  Performed at [facility], [place] [address], [place], [place] [address]          Radiology Studies: DG Chest 2 View  Result Date: 08/19/2019 CLINICAL DATA:  Chest pain EXAM: CHEST - 2 VIEW COMPARISON:  07/21/2019 FINDINGS: Cardiomegaly. Both lungs are clear. Disc degenerative disease of the thoracic spine. IMPRESSION: Cardiomegaly without acute abnormality of the lungs. Electronically Signed   By: Eddie Candle M.D.   On: 08/19/2019 15:56        Scheduled Meds: . apixaban  5 mg Oral BID  . atorvastatin  10 mg Oral Daily  . DULoxetine  40 mg Oral Daily  . hydrALAZINE  25 mg Oral TID  . insulin aspart  0-20 Units Subcutaneous TID PC & HS  . insulin glargine  45 Units Subcutaneous QHS  . isosorbide mononitrate  60 mg Oral Daily  . loratadine  10 mg Oral Daily  . magnesium oxide  400 mg Oral BID  . melatonin  6 mg Oral QHS  . metoprolol succinate  25 mg Oral Daily  . pantoprazole (PROTONIX) IV  40 mg Intravenous Q12H  . sodium chloride flush  3 mL Intravenous Once   Continuous Infusions: . 0.9 % NaCl with KCl 20 mEq / L 100 mL/hr at 08/20/19 1300     LOS: 0 days     Yaakov Guthrie, MD [facility]   To contact the attending provider between 7A-7P or the covering provider during after hours 7P-7A, please log into the web site [email or web address] and access using universal [place] password for that web site. If you do not have the password, please call the hospital operator.  08/20/2019, 3:05 PM

## 2019-08-21 DIAGNOSIS — N184 Chronic kidney disease, stage 4 (severe): Secondary | ICD-10-CM

## 2019-08-21 LAB — BASIC METABOLIC PANEL
Anion gap: 10 (ref 5–15)
BUN: 24 mg/dL — ABNORMAL HIGH (ref 6–20)
CO2: 19 mmol/L — ABNORMAL LOW (ref 22–32)
Calcium: 7.9 mg/dL — ABNORMAL LOW (ref 8.9–10.3)
Chloride: 113 mmol/L — ABNORMAL HIGH (ref 98–111)
Creatinine, Ser: 1.99 mg/dL — ABNORMAL HIGH (ref 0.44–1.00)
GFR calc Af Amer: 32 mL/min — ABNORMAL LOW (ref >60)
GFR calc non Af Amer: 28 mL/min — ABNORMAL LOW (ref >60)
Glucose, Bld: 137 mg/dL — ABNORMAL HIGH (ref 70–99)
Potassium: 4.3 mmol/L (ref 3.5–5.1)
Sodium: 142 mmol/L (ref 135–145)

## 2019-08-21 LAB — CBC
HCT: 36.1 % (ref 36.0–46.0)
Hemoglobin: 11.3 g/dL — ABNORMAL LOW (ref 12.0–15.0)
MCH: 27.2 pg (ref 26.0–34.0)
MCHC: 31.3 g/dL (ref 30.0–36.0)
MCV: 86.8 fL (ref 80.0–100.0)
Platelets: 268 10*3/uL (ref 150–400)
RBC: 4.16 MIL/uL (ref 3.87–5.11)
RDW: 15.1 % (ref 11.5–15.5)
WBC: 10 10*3/uL (ref 4.0–10.5)
nRBC: 0 % (ref 0.0–0.2)

## 2019-08-21 LAB — GLUCOSE, CAPILLARY
Glucose-Capillary: 153 mg/dL — ABNORMAL HIGH (ref 70–99)
Glucose-Capillary: 167 mg/dL — ABNORMAL HIGH (ref 70–99)
Glucose-Capillary: 262 mg/dL — ABNORMAL HIGH (ref 70–99)
Glucose-Capillary: 242 mg/dL — ABNORMAL HIGH (ref 70–99)

## 2019-08-21 LAB — MAGNESIUM: Magnesium: 1.6 mg/dL — ABNORMAL LOW (ref 1.7–2.4)

## 2019-08-21 LAB — TROPONIN I (HIGH SENSITIVITY): Troponin I (High Sensitivity): 22 ng/L — ABNORMAL HIGH (ref <18)

## 2019-08-21 MED ORDER — MAGNESIUM SULFATE 2 GM/50ML IV SOLN
2.0000 g | Freq: Once | INTRAVENOUS | Status: AC
  Administered 2019-08-21: 2 g via INTRAVENOUS
  Filled 2019-08-21: qty 50

## 2019-08-21 MED ORDER — PANTOPRAZOLE SODIUM 40 MG PO TBEC
40.0000 mg | DELAYED_RELEASE_TABLET | Freq: Two times a day (BID) | ORAL | Status: DC
  Administered 2019-08-21 – 2019-08-31 (×21): 40 mg via ORAL
  Filled 2019-08-21 (×20): qty 1

## 2019-08-21 MED ORDER — METOPROLOL SUCCINATE ER 25 MG PO TB24
12.5000 mg | ORAL_TABLET | Freq: Every day | ORAL | Status: DC
  Administered 2019-08-22 – 2019-08-26 (×5): 12.5 mg via ORAL
  Filled 2019-08-21 (×5): qty 1

## 2019-08-21 MED ORDER — SODIUM CHLORIDE 0.9 % IV SOLN: INTRAVENOUS | Status: DC

## 2019-08-21 MED ORDER — ISOSORBIDE MONONITRATE ER 30 MG PO TB24
30.0000 mg | ORAL_TABLET | Freq: Every day | ORAL | Status: DC
  Administered 2019-08-22 – 2019-08-31 (×10): 30 mg via ORAL
  Filled 2019-08-21 (×10): qty 1

## 2019-08-21 NOTE — Consult Note (Signed)
Renal Service Consult Note [place] 08/21/2019 [place] Requesting Physician:  Dr. Barth Kirks  Reason for Consult:  Acute on CKD3 HPI: The patient is a 54 y.o. year-old w/ hx of CVA, parox afib, obesity, NICM, HTN, HL, gout, gastroparesis, PUD, DM2, chronic pain, anemia, AKI who presented w/ chest pain and vomiting. Is on home O2 at 3L. No sig SOB. She had abd pain and was admitted for poss diabetic gastroparesis and given IVF's, zofran/ phenergan, and IV PPI.  Clear liquids were ordered. Pt's creat was 1.6 on admit, 1.5 yest and 1.9 today. Baseline creat is around 1.1. 1.6.  Asked to see for renal faliure.   Pt c/o thirst, no vomiting here , was vomiting at home and had some diarrhea over the last week.  No voiding issues. No nsaids.        Date   Creat   eGFR  2011- 15  0.80- 1.13  2017  0.88- 1.25  2018  0.99- 1.69  2019  1.03- 1.75  07/2018 1.40- 1.82  12/2018 1.39- 1.83  01/2019 1.39- 1.83  02/2019 1.33- 1.47  03/2019 1.34- 2.17  3/21  1.29- 1.93  4/21  1.27- 2.70  5/21  1.10- 2.10 30- >60  08/19/19 1.61  41   6/14  1.52  44   6/15   1.99  32      ROS  no joint pain   no HA  no blurry vision  no rash  no dysuria  no difficulty voiding  no change in urine color    Past Medical History  Past Medical History:  Diagnosis Date  . Acute back pain with sciatica, left   . Acute back pain with sciatica, right   . AKI (acute kidney injury) ([place])   . Anemia, unspecified   . Chronic pain   . Chronic systolic CHF (congestive heart failure) ([place])   . Diabetes mellitus   . Esophageal reflux   . Fibromyalgia   . Gastric ulcer   . Gastroparesis   . Gout   . Hyperlipidemia   . Hypertension   . Lumbosacral stenosis   . NICM (nonischemic cardiomyopathy) ([place])   . Obesity   . PAF (paroxysmal atrial fibrillation) ([place])   . Stroke ([place]) 02/2011  . Vitamin B12 deficiency anemia    Past Surgical History  Past Surgical History:   Procedure Laterality Date  . BIOPSY  07/27/2019   Procedure: BIOPSY;  Surgeon: Clarene Essex, MD;  Location: [facility] END[facility]COPY;  Service: Endoscopy;;  . BIOPSY  07/30/2019   Procedure: BIOPSY;  Surgeon: Otis Brace, MD;  Location: [facility]OSCOPY;  Service: Gastroenterology;;  . CATARACT EXTRACTION  01/2014  . CHOLECYSTECTOMY    . COLONOSCOPY WITH PROPOFOL N/A 07/30/2019   Procedure: COLONOSCOPY WITH PROPOFOL;  Surgeon: Otis Brace, MD;  Location: [facility];  Service: Gastroenterology;  Laterality: N/A;  . ESOPHAGOGASTRODUODENOSCOPY N/A 07/27/2019   Procedure: ESOPHAGOGASTRODUODENOSCOPY (EGD);  Surgeon: Clarene Essex, MD;  Location: [facility];  Service: Endoscopy;  Laterality: N/A;  . ESOPHAGOGASTRODUODENOSCOPY (EGD) WITH PROPOFOL N/A 08/02/2019   Procedure: ESOPHAGOGASTRODUODENOSCOPY (EGD) WITH PROPOFOL;  Surgeon: Otis Brace, MD;  Location: [facility];  Service: Gastroenterology;  Laterality: N/A;  . HEMOSTASIS CLIP PLACEMENT  08/02/2019   Procedure: HEMOSTASIS CLIP PLACEMENT;  Surgeon: Otis Brace, MD;  Location: [facility];  Service: Gastroenterology;;  . POLYPECTOMY  07/30/2019   Procedure: POLYPECTOMY;  Surgeon: Otis Brace, MD;  Location: [facility];  Service: Gastroenterology;;  . POLYPECTOMY  08/02/2019   Procedure: POLYPECTOMY;  Surgeon: Otis Brace, MD;  Location: [facility];  Service: Gastroenterology;;   Family History  Family History  Problem Relation Age of Onset  . Diabetes Mother   . Diabetes Father   . Heart disease Father   . Diabetes Sister   . Congestive Heart Failure Sister 43  . Diabetes Brother    Social History  reports that she has never smoked. She has never used smokeless tobacco. She reports that she does not drink alcohol and does not use drugs. Allergies  Allergies  Allergen Reactions  . Contrast Media [Iodinated Diagnostic Agents] Anaphylaxis    Cardiac arrest  . Diazepam [name]  . Isovue [Iopamidol]  Anaphylaxis    Patient had seizure like activity and then code post 100 cc of isovue 300  . Lisinopril Anaphylaxis    Tongue and mouth swelling  . Penicillins Palpitations    Has patient had a PCN reaction causing immediate rash, facial/tongue/throat swelling, SOB or lightheadedness with hypotension: Yes, heart races Has patient had a PCN reaction causing severe rash involving mucus membranes or skin necrosis: No Has patient had a PCN reaction that required hospitalization: Yes  Has patient had a PCN reaction occurring within the last 10 years: No   . Acetaminophen Nausea Only and Other (See Comments)    Irritates stomach ulcer  Abdominal pain  . Tolmetin Nausea Only and Other (See Comments)    ULCER  . Aspirin Other (See Comments)    Irritates stomach ulcer   . Metoclopramide Other (See Comments)    Tardive dyskinesia  . Nsaids Other (See Comments)    ULCER  . Tramadol Nausea And Vomiting   Home medications Prior to Admission medications   Medication Sig Start Date End Date Taking? Authorizing Provider  albuterol (PROVENTIL) (2.5 MG/3ML) 0.083% nebulizer solution Take 3 mLs (2.5 mg total) by nebulization every 6 (six) hours as needed for wheezing or shortness of breath. 04/06/19  Yes Scot Jun, FNP  ALPRAZolam Duanne Moron) 1 MG tablet Take 1 tablet (1 mg total) by mouth 2 (two) times daily as needed for anxiety. 08/09/19  Yes Rai, Ripudeep K, MD  atorvastatin (LIPITOR) 10 MG tablet Take 1 tablet (10 mg total) by mouth daily. 12/13/18  Yes Lockamy, Christia [place], DO  cetirizine (ZYRTEC) 10 MG tablet Take 1 tablet (10 mg total) by mouth daily. 12/13/18  Yes Lockamy, Timothy, DO  dicyclomine (BENTYL) 20 MG tablet Take 1 tablet (20 mg total) by mouth 3 (three) times daily as needed for spasms (abdominal cramping). 08/09/19 09/08/19 Yes Rai, Ripudeep K, MD  DULoxetine HCl 40 MG CPEP TAKE ONE CAPSULE BY MOUTH TWICE DAILY Patient taking differently: Take 40 mg by mouth in the morning and at bedtime.   06/04/19  Yes Lockamy, Timothy, DO  ELIQUIS 5 MG TABS tablet TAKE 1 TABLET (5 MG TOTAL) BY MOUTH 2 (TWO) TIMES DAILY. Patient taking differently: Take 5 mg by mouth 2 (two) times daily.  07/19/19  Yes Lockamy, Timothy, DO  fluticasone (FLONASE) 50 MCG/ACT nasal spray Place 2 sprays into both nostrils daily as needed for allergies or rhinitis. 12/19/18  Yes Rai, Ripudeep K, MD  hydrALAZINE (APRESOLINE) 25 MG tablet Take 1 tablet (25 mg total) by mouth 3 (three) times daily. For blood pressure 07/19/19 10/17/19 Yes Lockamy, Timothy, DO  insulin aspart (NOVOLOG FLEXPEN) 100 UNIT/ML FlexPen Inject 15 Units into the skin 3 (three) times daily with meals. Patient taking differently: Inject 30 Units into the  skin 3 (three) times daily with meals.  06/26/19  Yes Mariel Aloe, MD  isosorbide mononitrate (IMDUR) 60 MG 24 hr tablet Take 1 tablet (60 mg total) by mouth daily. 07/05/19  Yes Eugenie Filler, MD  LANTUS SOLOSTAR 100 UNIT/ML Solostar Pen Inject 45 Units into the skin at bedtime. 06/14/19  Yes Pokhrel, Laxman, MD  loperamide (IMODIUM) 2 MG capsule Take 2 mg by mouth 4 (four) times daily as needed for diarrhea or loose stools.   Yes [provider]  magnesium oxide (MAG-OX) 400 (241.3 Mg) MG tablet Take 1 tablet (400 mg total) by mouth 2 (two) times daily. 06/14/19  Yes Pokhrel, Laxman, MD  Melatonin 3 MG TABS Take 2 tablets (6 mg total) by mouth at bedtime. 12/13/18  Yes Lockamy, Timothy, DO  metoprolol succinate (TOPROL-XL) 25 MG 24 hr tablet Take 25 mg by mouth daily. 06/01/19  Yes [provider]  nitroGLYCERIN (NITROSTAT) 0.4 MG SL tablet Place 1 tablet (0.4 mg total) under the tongue every 5 (five) minutes as needed for chest pain. 12/13/18  Yes Lockamy, Timothy, DO  ondansetron (ZOFRAN-ODT) 4 MG disintegrating tablet Take 1 tablet (4 mg total) by mouth every 8 (eight) hours as needed for nausea or vomiting. 08/09/19  Yes Rai, Ripudeep K, MD  pantoprazole (PROTONIX) 40 MG tablet Take 1  tablet (40 mg total) by mouth 2 (two) times daily before a meal. 08/09/19  Yes Rai, Ripudeep K, MD  torsemide (DEMADEX) 20 MG tablet Take 2 tablets (40 mg total) by mouth daily. Hold if having significant vomiting 07/06/19  Yes Eugenie Filler, MD  blood glucose meter kit and supplies KIT Dispense based on patient and insurance preference. Use up to four times daily as directed. (FOR ICD-9 250.00, 250.01). 12/13/18   [name], DO  dicyclomine (BENTYL) 20 MG tablet TAKE 1 TABLET (20 MG TOTAL) BY MOUTH 3 (THREE) TIMES DAILY AS NEEDED FOR SPASMS (ABDOMINAL CRAMPING). Patient not taking: Reported on 06/29/2019 06/22/19 07/22/19  [name], DO     Vitals:   08/20/19 1944 08/20/19 2059 08/21/19 0557 08/21/19 1234  BP: 120/76  118/72 (!) 103/59  Pulse: 84  88 86  Resp: '18  20 20  ' Temp:  (!) 97.5 F (36.4 C) 97.8 F (36.6 C) 98.1 F (36.7 C)  TempSrc:  Oral Oral Oral  SpO2: 100%  95% 93%  Weight:      Height:       Exam Gen obese lady sitting up in chair, no distress, calm No rash, cyanosis or gangrene Sclera anicteric, throat clear slightly dry  No jvd or bruits, flat Chest clear bilat to bases, no rales RRR no MRG  Abd soft ntnd no mass or ascites +bs GU  defer MS no joint effusions or deformity Ext trace-1+ pedal edema, no wounds or ulcers Neuro is alert, Ox 3 , nf    Home meds:  - torsemide 40 qd/ lipitor 10/ eliquis 5 bid/ hydralazine 25 tid/ imdur 60 qd/ metoprolol xl 25 qd/ sl ntg prn  - insulin lantus 45u hs/ novolog 15u tid ac  - protonix 40 bid  - xanax 35m bid prn/ duloxetine 40 bid  - prn bentyl/ prn zofran  - prn's/ vitamins/ supplements    UA 07/21/19 - clear , 20 ketone, 6.0, >300 prot, no bact, 6-10 rbc/ 0-5 wbc   UA 06/29/19 - clear , >500 glu, 5.0, 100 prot, 0-5 rbc/ wbc    UA here - not done yet  CT abd 08/03/19 - Adrenals/Urinary Tract: The adrenal glands and kidneys are unremarkable.    Renal US 03/14/19 for AKI >> 11.8/ 11.9 cm kidneys w/o hydro,  normal bladder    Renal US  06/10/19 >>  12 cm / 12 cm kidneys w/ normal echo, no hydro,nl bladder   Assessment/ Plan: 1. AoCKD 3a - baseline creat around 1.1- 1.6, eGFR of 40- 60.  Appears to have multiple AKI episodes over the past years , peak creat of mid 2's. She appears dry on exam today.  Agree w/ IVF"s. Will cut back on her BP medication as BP has dropped relatively speaking since her admit (160 /80 >> 105/70) which can affect renal perfusion esp if pt has long-standing HTN issues.  No acei/ arb/ nsaid or contrast noted. Will get UA and urine lytes, doesn't need another renal US.  Needs I/O's. Cont to hold diuretic. Will follow.  2. Nausea/ vomiting - poss gastroparesis 3. Chronic pain - per pmd 4. DM2 insulin dependent 5. Depression      Rob Jayde Mcallister  MD 08/21/2019, 3:50 PM  Recent Labs  Lab 08/20/19 0853 08/21/19 0523  WBC 11.8* 10.0  HGB 9.1* 11.3*   Recent Labs  Lab 08/20/19 0853 08/21/19 0523  K 3.3* 4.3  BUN 23* 24*  CREATININE 1.52* 1.99*  CALCIUM 8.2* 7.9*

## 2019-08-21 NOTE — Progress Notes (Signed)
Noted bladder scan order from previous shift. Bladder scan performed and resulted in 73ml. Pt aware of need for urine sample for testing.

## 2019-08-21 NOTE — Progress Notes (Signed)
PT Cancellation Note  Patient Details Name: Deborah [name] MRN: [id number] DOB: Dec 02, 1965   Cancelled Treatment:    Reason Eval/Treat Not Completed: PT screened, no needs identified, will sign off. Spoke with pt who denied need for PT services. Pt was sitting in recliner and she stated she did not have any issues transferring. She also reported she is mostly WC bound at baseline.    [facility]  Office: [phone] Pager: [phone]

## 2019-08-21 NOTE — Progress Notes (Addendum)
PROGRESS NOTE    Deborah [name]  QMV:[id number] DOB: 07-Apr-1965 DOA: 08/19/2019 PCP: [name], DO   Chief Complaint  Patient presents with  . [name]  . Chest Pain  . Emesis    Brief Narrative:  Deborah [name]  is a 54 y.o. African-American female with a known history of type II obese mellitus, chronic systolic CHF, hypertension, dyslipidemia and GERD as well as gastroparesis, presented to the emergency room with acute onset of intractable vomiting with associated epigastric and lower abdominal pain. Denies hematemesis or malena. Also had chest pain felt as pressure and graded 5/10 in severity with radiation to her left arm and associated mild dyspnea without complications.   Upon presentation to the emergency room, blood pressure was 153/105 with heart rate of 115 and RR 24 and temperature 98.  Labs revealed hypokalemia of 3.1 glucose of 218 with a BUN of 30 and creatinine 1.61 compared to 30/1.28 08/07/2019.  Serum lipase was 34 and BNP 60.8.  High-sensitivity troponin I was 26 and later 21. CBC showed minimal leukocytosis of 11.7 with neutrophilia and anemia better than previous levels. Chest x-ray showed cardiomegaly without acute cardiopulmonary disease. EKG showed sinus tachycardia with a rate of 116 with left axis deviation poor R wave progression.  The patient was given Dilaudid 1 mg IV twice as well as 4 mg of IV Zofran and 1 L bolus of IV normal saline.  She was still having recurrent epigastric pain and nausea.   Assessment & Plan:   Active Problems:   Diabetic gastroparesis ([place])  1.  Diabetic gastroparesis with intractable epigastric pain as well as recurrent nausea and vomiting. - Continue hydration with IV normal saline. -Continue as needed IV Zofran and Phenergan.  She has not tolerated Reglan in the past.  It apparently gave her extraparametal symptoms and tachycardia. -She says she is still having epigastric pain and morphine not helping.   She keeps asking for IV Dilaudid.  08/21/2019: No witnessed vomiting episodes per the nursing staff.  Today I talked to her about starting oral pain medications when she got very upset and insisting on continuing the IV Dilaudid. -Continue Protonix. -On clear liquids to be advanced as tolerated. -Once her diet is advanced and she is able to tolerate p.o. consider switching to oral pain meds and discontinuing the IV Dilaudid.   2.  Hypokalemia/hypomagnesemia. - Likely secondary to recurrent nausea and vomiting. -Ordered replacement.  Continue to monitor and replace as needed.  3.  Acute kidney injury. -This is likely prerenal secondary to volume depletion and dehydration from recurrent nausea and vomiting? -On IV hydration. -Creatinine also worsened.  Consulted nephrology. -Continue to monitor BUN/closely.  4.  Chest pain, likely atypical. -So far her troponin I is stable. - Today she denies chest pain and is complaining of epigastric abdominal discomfort. -If this continues despite current management and antinausea therapy as well as PPIs, consider cardiology consultation for cardiac risk stratification.  5. Type 2 diabetes mellitus. -Started on NovoLog.  Continue Lantus 45 units subcutaneous nightly.  Continue to monitor Accu-Cheks and adjust dosage as needed.  Patient likely noncompliant with her diabetic diet.  She says she checks her blood glucose only twice at home. -Consulted diabetes coordinator.  6.  Hypertension. -On Imdur, hydralazine, Toprol-XL which is supposed to be her home medications.  On IV hydration for the acute kidney injury secondary to nausea and vomiting.  -We will hold for low blood pressure.  Continue to monitor blood pressure  and adjust medications as needed.  7.  Dyslipidemia. -We will continue statin therapy.  8.  Anxiety. -Continue Xanax.  9. COPD without exacerbation. -Continue her nebulizer therapy.   DVT prophylaxis: Eliquis (may need to  decrease dose if creatinine continues to be elevated) Code Status: Full Family Communication: No family at bedside. Disposition:   Status is: Inpatient  Remains inpatient appropriate because:IV treatments appropriate due to intensity of illness or inability to take PO   Dispo: The patient is from: Home              Anticipated d/c is to: Home              Anticipated d/c date is: 2 days              Patient currently is not medically stable to d/c.       Consultants:   None   Procedures:   None   Antimicrobials:   None    Subjective: She is continuing to complain of epigastric pain along with nausea. Keeps asking for IV Dilaudid. Denies having any chest pain at this time.  Objective: Vitals:   08/20/19 1944 08/20/19 2059 08/21/19 0557 08/21/19 1234  BP: 120/76  118/72 (!) 103/59  Pulse: 84  88 86  Resp: 18  20 20   Temp:  (!) 97.5 F (36.4 C) 97.8 F (36.6 C) 98.1 F (36.7 C)  TempSrc:  Oral Oral Oral  SpO2: 100%  95% 93%  Weight:      Height:        Intake/Output Summary (Last 24 hours) at 08/21/2019 1512 Last data filed at 08/21/2019 0606 Gross per 24 hour  Intake 1840.76 ml  Output --  Net 1840.76 ml   Filed Weights   08/19/19 2300  Weight: (!) 147.6 kg    Examination:  General exam: Morbidly obese female, awake and oriented, c/o abdominal pain but not in any acute distress at this time Respiratory system: Decreased breath sounds lower lobes otherwise clear to auscultation. Respiratory effort normal. Cardiovascular system: S1 & S2 heard, RRR. No murmur. Has pedal edema. Gastrointestinal system: obese female, mild epigastric tenderness without any guarding or rebound. No organomegaly or masses felt. Normal bowel sounds heard. Central nervous system: Alert and oriented. No focal neurological deficits. Extremities: Symmetric 5 x 5 power. Skin: No rashes, lesions or ulcers Psychiatry: Judgement and insight appear normal. Mood & affect appropriate.      Data Reviewed: I have personally reviewed following labs and imaging studies  CBC: Recent Labs  Lab 08/19/19 1619 08/20/19 0853 08/21/19 0523  WBC 11.7* 11.8* 10.0  NEUTROABS 8.9*  --   --   HGB 11.1* 9.1* 11.3*  HCT 35.4* 28.9* 36.1  MCV 84.9 87.0 86.8  PLT 436* 348 196    Basic Metabolic Panel: Recent Labs  Lab 08/19/19 1619 08/19/19 1901 08/20/19 0853 08/21/19 0523  NA 141  --  141 142  K 3.1*  --  3.3* 4.3  CL 107  --  108 113*  CO2 20*  --  23 19*  GLUCOSE 218*  --  204* 137*  BUN 27*  --  23* 24*  CREATININE 1.61*  --  1.52* 1.99*  CALCIUM 9.5  --  8.2* 7.9*  MG  --  1.2*  --  1.6*    GFR: Estimated Creatinine Clearance: 47.7 mL/min (A) (by C-G formula based on SCr of 1.99 mg/dL (H)).  Liver Function Tests: Recent Labs  Lab 08/19/19 1619  AST 21  ALT 20  ALKPHOS 106  BILITOT 0.8  PROT 8.3*  ALBUMIN 3.5    CBG: Recent Labs  Lab 08/20/19 1224 08/20/19 1625 08/20/19 2128 08/21/19 0731 08/21/19 1201  GLUCAP 223* 224* 133* 153* 167*     Recent Results (from the past 240 hour(s))  SARS Coronavirus 2 by RT PCR (hospital order, performed in [facility] hospital lab) Nasopharyngeal Nasopharyngeal Swab     Status: None   Collection Time: 08/19/19  7:23 PM   Specimen: Nasopharyngeal Swab  Result Value Ref Range Status   SARS Coronavirus 2 NEGATIVE NEGATIVE Final    Comment: (NOTE) SARS-CoV-2 target nucleic acids are NOT DETECTED.  The SARS-CoV-2 RNA is generally detectable in upper and lower respiratory specimens during the acute phase of infection. The lowest concentration of SARS-CoV-2 viral copies this assay can detect is 250 copies / mL. A negative result does not preclude SARS-CoV-2 infection and should not be used as the sole basis for treatment or other patient management decisions.  A negative result may occur with improper specimen collection / handling, submission of specimen other than nasopharyngeal swab, presence of viral  mutation(s) within the areas targeted by this assay, and inadequate number of viral copies (<250 copies / mL). A negative result must be combined with clinical observations, patient history, and epidemiological information.  Fact Sheet for Patients:   StrictlyIdeas.no  Fact Sheet for Healthcare Providers: BankingDealers.co.za  This test is not yet approved or  cleared by the [place] FDA and has been authorized for detection and/or diagnosis of SARS-CoV-2 by FDA under an Emergency Use Authorization (EUA).  This EUA will remain in effect (meaning this test can be used) for the duration of the COVID-19 declaration under Section 564(b)(1) of the Act, 21 U.S.C. section 360bbb-3(b)(1), unless the authorization is terminated or revoked sooner.  Performed at [facility], [place] [address] [place], [place] [address]          Radiology Studies: DG Chest 2 View  Result Date: 08/19/2019 CLINICAL DATA:  Chest pain EXAM: CHEST - 2 VIEW COMPARISON:  07/21/2019 FINDINGS: Cardiomegaly. Both lungs are clear. Disc degenerative disease of the thoracic spine. IMPRESSION: Cardiomegaly without acute abnormality of the lungs. Electronically Signed   By: Eddie Candle M.D.   On: 08/19/2019 15:56        Scheduled Meds: . apixaban  5 mg Oral BID  . atorvastatin  10 mg Oral Daily  . DULoxetine  40 mg Oral Daily  . hydrALAZINE  25 mg Oral TID  . insulin aspart  0-20 Units Subcutaneous TID PC & HS  . insulin glargine  30 Units Subcutaneous QHS  . isosorbide mononitrate  60 mg Oral Daily  . loratadine  10 mg Oral Daily  . melatonin  6 mg Oral QHS  . metoprolol succinate  25 mg Oral Daily  . pantoprazole  40 mg Oral BID   Continuous Infusions: . sodium chloride 100 mL/hr at 08/21/19 1117     LOS: 1 day     Yaakov Guthrie, MD [facility]   To contact the attending provider between 7A-7P or the covering provider  during after hours 7P-7A, please log into the web site [email or web address] and access using universal [place] password for that web site. If you do not have the password, please call the hospital operator.  08/21/2019, 3:12 PM

## 2019-08-22 ENCOUNTER — Ambulatory Visit: Payer: Self-pay | Admitting: Licensed Clinical Social Worker

## 2019-08-22 ENCOUNTER — Inpatient Hospital Stay: Payer: Medicare Other | Admitting: Hematology

## 2019-08-22 LAB — BASIC METABOLIC PANEL
Anion gap: 7 (ref 5–15)
BUN: 23 mg/dL — ABNORMAL HIGH (ref 6–20)
CO2: 21 mmol/L — ABNORMAL LOW (ref 22–32)
Calcium: 8.1 mg/dL — ABNORMAL LOW (ref 8.9–10.3)
Chloride: 106 mmol/L (ref 98–111)
Creatinine, Ser: 2 mg/dL — ABNORMAL HIGH (ref 0.44–1.00)
GFR calc Af Amer: 32 mL/min — ABNORMAL LOW (ref >60)
GFR calc non Af Amer: 27 mL/min — ABNORMAL LOW (ref >60)
Glucose, Bld: 147 mg/dL — ABNORMAL HIGH (ref 70–99)
Potassium: 3.6 mmol/L (ref 3.5–5.1)
Sodium: 134 mmol/L — ABNORMAL LOW (ref 135–145)

## 2019-08-22 LAB — URINALYSIS, ROUTINE W REFLEX MICROSCOPIC
Bilirubin Urine: NEGATIVE
Glucose, UA: 50 mg/dL — AB
Hgb urine dipstick: NEGATIVE
Ketones, ur: NEGATIVE mg/dL
Nitrite: NEGATIVE
Protein, ur: >=300 mg/dL — AB
Specific Gravity, Urine: 1.014 (ref 1.005–1.030)
pH: 5 (ref 5.0–8.0)

## 2019-08-22 LAB — HEPATIC FUNCTION PANEL
ALT: 14 U/L (ref 0–44)
AST: 11 U/L — ABNORMAL LOW (ref 15–41)
Albumin: 2.9 g/dL — ABNORMAL LOW (ref 3.5–5.0)
Alkaline Phosphatase: 86 U/L (ref 38–126)
Bilirubin, Direct: 0.1 mg/dL (ref 0.0–0.2)
Indirect Bilirubin: 0.5 mg/dL (ref 0.3–0.9)
Total Bilirubin: 0.6 mg/dL (ref 0.3–1.2)
Total Protein: 6.6 g/dL (ref 6.5–8.1)

## 2019-08-22 LAB — TROPONIN I (HIGH SENSITIVITY): Troponin I (High Sensitivity): 19 ng/L — ABNORMAL HIGH (ref <18)

## 2019-08-22 LAB — CREATININE, URINE, RANDOM: Creatinine, Urine: 207.22 mg/dL

## 2019-08-22 LAB — GLUCOSE, CAPILLARY
Glucose-Capillary: 155 mg/dL — ABNORMAL HIGH (ref 70–99)
Glucose-Capillary: 167 mg/dL — ABNORMAL HIGH (ref 70–99)
Glucose-Capillary: 234 mg/dL — ABNORMAL HIGH (ref 70–99)
Glucose-Capillary: 205 mg/dL — ABNORMAL HIGH (ref 70–99)

## 2019-08-22 LAB — SODIUM, URINE, RANDOM: Sodium, Ur: 21 mmol/L

## 2019-08-22 LAB — MAGNESIUM: Magnesium: 1.8 mg/dL (ref 1.7–2.4)

## 2019-08-22 MED ORDER — TORSEMIDE 20 MG PO TABS
40.0000 mg | ORAL_TABLET | Freq: Every day | ORAL | Status: DC
  Administered 2019-08-22 – 2019-08-26 (×5): 40 mg via ORAL
  Filled 2019-08-22 (×5): qty 2

## 2019-08-22 NOTE — Chronic Care Management (AMB) (Signed)
    Clinical Social Work  Care Management Outreach   08/22/2019 Name: AINE STRYCHARZ MRN: [id number] DOB: 02-Nov-1965 [name] is a 54 y.o. year old female who is a primary care patient of [name], DO .  The Care Management team was consulted for assistance with Level of Care Concerns for increase in PCS hours.  LCSW called [facility] for update. Patient does not quality for increase in hours.  Per [place] patient has services in place with the max hours she can receive LCSW reached out to [facility] today by phone to provide an update. The outreach was unsuccessful. A HIPPA compliant phone message was left for the patient providing contact information and requesting a return call.     After the outreach, LCSW realized patient has been readmitted to hospital. Collaboration with CCM RN.  Plan: CCM RN will reach out to patient after discharge    Review of patient status, including review of consultants reports, relevant laboratory and other test results, and collaboration with appropriate care team members and the patient's provider was performed as part of comprehensive patient evaluation and provision of care management services.    [name], [place] / [place]   [phone] 3:33 PM

## 2019-08-22 NOTE — Progress Notes (Signed)
Noted BLE pitting edema and patient c/o pain and difficulty ambulating because of this. She states that she takes torsemide at home for swelling. Noted that diuretics on hold d/t kidney function. Encouraged patient to elevate legs on pillows while in bed or keep foot of bed elevated. Pt refused to do either. Pt states that she sat in the chair for most of the day today. I encouraged her to elevate recliner while in bed to help with edema. Pt agreed to do this today.

## 2019-08-22 NOTE — Progress Notes (Signed)
PROGRESS NOTE    [name]  FXJ:[id number] DOB: 07-21-1965 DOA: 08/19/2019 PCP: [name], DO   Chief Complaint  Patient presents with  . [name]  . Chest Pain  . Emesis    Brief Narrative:  [name] y.o.African-American femalewith [name] known history of type II obese mellitus, chronic systolic CHF, hypertension, dyslipidemia and GERD as well as gastroparesis, presented to the emergency room with acute onset of intractable vomiting with associated epigastric and lower abdominal pain. Denies hematemesis or malena. Also had chest pain felt as pressure and graded 5/10 in severity with radiation to her left arm and associated mild dyspnea without complications.   Upon presentation to the emergency room, blood pressure was 153/105 with heart rate of 115 and RR 24 and temperature 98. Labs revealed hypokalemia of 3.1 glucose of 218 with [name] of 30 and creatinine 1.61 compared to 30/1.28 08/07/2019. Serum lipase was 34 and BNP 60.8. High-sensitivity troponin I was 26 and later 21. CBC showed minimal leukocytosis of 11.7 with neutrophilia and anemia better than previous levels. Chest x-ray showed cardiomegaly without acute cardiopulmonary disease.EKG showed sinus tachycardia with [name] rate of 116 with left axis deviation poor R wave progression.  The patient was given Dilaudid 1 mg IV twice as well as 4 mg of IV Zofran and 1 L bolus of IV normal saline. She was still having recurrent epigastric pain and nausea.   Assessment & Plan:   Active Problems:   Diabetic gastroparesis ([name])  1. Diabetic gastroparesis with intractable epigastric pain as well as recurrent nausea and vomiting. - Continue hydration with IV normal saline. - Continue as needed IV Zofran and Phenergan. Apparently she has not tolerated Reglan in the past. It apparently gave her extraparametal symptoms and tachycardia.  Consider trial of erythromycin?  Will discuss with Mrs.  Collings. -IV dilaudid prn, will need to taper this and try to limit opiates -Continue Protonix. -On clear liquids to be advanced as tolerated. - CT from 5/28 with 15 mm metallic density in region of duodenal bulb - consider repeat imaging if persistent pain  2. Hypokalemia/hypomagnesemia. - improved, follow  3. Acute kidney injury. -creatinine 2, stable, baseline ~1.3-1.8 - Per holding further IVF and resuming torsemide with LE edema in setting of IVF (+7.7 L) - follow UP/C - UA with RBC's, WBC's and proteinuria - follow outpatient  4. Chest pain, likely atypical. - troponin flat, low  - suspect this is related to epigastric pain - consider additional w/u if worsening or concerning symptoms  5.Type 2 diabetes mellitus. -Continue lantus 30 units daily (lower dose) -Consulted diabetes coordinator.  6. Hypertension. -On Imdur, Toprol-XL which is supposed to be her home medications.  Hydral on hold.  Torsemide resumed today.  7. Dyslipidemia. -We will continue statin therapy.  8. Anxiety. -Continue Xanax.  9.COPD without exacerbation. -Continue her nebulizer therapy.  # LE edema: torsemide resumed per renal, follow LE [place] (low suspicion as on anticoagulation)  # Atrial Fibrillation: continue eliquis  DVT prophylaxis: eliquis Code Status: full  Family Communication: none at bedside Disposition:   Status is: Inpatient  Remains inpatient appropriate because:Inpatient level of care appropriate due to severity of illness   Dispo: The patient is from: Home              Anticipated d/c is to: Home              Anticipated d/c date is: 2 days  Patient currently is not medically stable to d/c.   Consultants:   nephrology  Procedures:   none  Antimicrobials:  Anti-infectives (From admission, onward)   None     Subjective: C/o pain in bilateral feet  Objective: Vitals:   08/21/19 2140 08/22/19 0500 08/22/19 0513 08/22/19 1248   BP: 137/76  (!) 144/81 130/65  Pulse: 97  99 95  Resp: 20  20 20   Temp: 98.3 F (36.8 C)  98.1 F (36.7 C) 98.3 F (36.8 C)  TempSrc: Axillary  Oral Oral  SpO2: 98%  96% 95%  Weight:  (!) 155.5 kg    Height:        Intake/Output Summary (Last 24 hours) at 08/22/2019 1926 Last data filed at 08/22/2019 1300 Gross per 24 hour  Intake 1856.28 ml  Output 450 ml  Net 1406.28 ml   Filed Weights   08/19/19 2300 08/22/19 0500  Weight: (!) 147.6 kg (!) 155.5 kg    Examination:  General exam: Appears calm and comfortable  Respiratory system: Clear to auscultation. Respiratory effort normal. Cardiovascular system: S1 & S2 heard, RRR. Gastrointestinal system: Abdomen is nondistended, soft and nontender Central nervous system: Alert and oriented. No focal neurological deficits. Extremities: bilateral LE edema Skin: No rashes, lesions or ulcers Psychiatry: Judgement and insight appear normal. Mood & affect appropriate.     Data Reviewed: I have personally reviewed following labs and imaging studies  CBC: Recent Labs  Lab 08/19/19 1619 08/20/19 0853 08/21/19 0523  WBC 11.7* 11.8* 10.0  NEUTROABS 8.9*  --   --   HGB 11.1* 9.1* 11.3*  HCT 35.4* 28.9* 36.1  MCV 84.9 87.0 86.8  PLT 436* 348 948    Basic Metabolic Panel: Recent Labs  Lab 08/19/19 1619 08/19/19 1901 08/20/19 0853 08/21/19 0523 08/22/19 0911  NA 141  --  141 142 134*  K 3.1*  --  3.3* 4.3 3.6  CL 107  --  108 113* 106  CO2 20*  --  23 19* 21*  GLUCOSE 218*  --  204* 137* 147*  BUN 27*  --  23* 24* 23*  CREATININE 1.61*  --  1.52* 1.99* 2.00*  CALCIUM 9.5  --  8.2* 7.9* 8.1*  MG  --  1.2*  --  1.6* 1.8    GFR: Estimated Creatinine Clearance: 49.1 mL/min (Isael Stille) (by C-G formula based on SCr of 2 mg/dL (H)).  Liver Function Tests: Recent Labs  Lab 08/19/19 1619 08/22/19 0911  AST 21 11*  ALT 20 14  ALKPHOS 106 86  BILITOT 0.8 0.6  PROT 8.3* 6.6  ALBUMIN 3.5 2.9*    CBG: Recent Labs  Lab  08/21/19 1551 08/21/19 2131 08/22/19 0740 08/22/19 1132 08/22/19 1633  GLUCAP 262* 242* 155* 167* 234*     Recent Results (from the past 240 hour(s))  SARS Coronavirus 2 by RT PCR (hospital order, performed in [facility] hospital lab) Nasopharyngeal Nasopharyngeal Swab     Status: None   Collection Time: 08/19/19  7:23 PM   Specimen: Nasopharyngeal Swab  Result Value Ref Range Status   SARS Coronavirus 2 NEGATIVE NEGATIVE Final    Comment: (NOTE) SARS-CoV-2 target nucleic acids are NOT DETECTED.  The SARS-CoV-2 RNA is generally detectable in upper and lower respiratory specimens during the acute phase of infection. The lowest concentration of SARS-CoV-2 viral copies this assay can detect is 250 copies / mL. Amelya Mabry negative result does not preclude SARS-CoV-2 infection and should not be used as the sole  basis for treatment or other patient management decisions.  Lakia Gritton negative result may occur with improper specimen collection / handling, submission of specimen other than nasopharyngeal swab, presence of viral mutation(s) within the areas targeted by this assay, and inadequate number of viral copies (<250 copies / mL). Janet Decesare negative result must be combined with clinical observations, patient history, and epidemiological information.  Fact Sheet for Patients:   StrictlyIdeas.no  Fact Sheet for Healthcare Providers: BankingDealers.co.za  This test is not yet approved or  cleared by the [place] FDA and has been authorized for detection and/or diagnosis of SARS-CoV-2 by FDA under an Emergency Use Authorization (EUA).  This EUA will remain in effect (meaning this test can be used) for the duration of the COVID-19 declaration under Section 564(b)(1) of the Act, 21 U.S.C. section 360bbb-3(b)(1), unless the authorization is terminated or revoked sooner.  Performed at [facility], [place] [address], [place], [place]  [address]          Radiology Studies: No results found.      Scheduled Meds: . apixaban  5 mg Oral BID  . atorvastatin  10 mg Oral Daily  . DULoxetine  40 mg Oral Daily  . insulin aspart  0-20 Units Subcutaneous TID PC & HS  . insulin glargine  30 Units Subcutaneous QHS  . isosorbide mononitrate  30 mg Oral Daily  . loratadine  10 mg Oral Daily  . melatonin  6 mg Oral QHS  . metoprolol succinate  12.5 mg Oral Daily  . pantoprazole  40 mg Oral BID  . torsemide  40 mg Oral Daily   Continuous Infusions:   LOS: 2 days    Time spent: over 30 min    Fayrene Helper, MD [facility]   To contact the attending provider between 7A-7P or the covering provider during after hours 7P-7A, please log into the web site [email or web address] and access using universal [place] password for that web site. If you do not have the password, please call the hospital operator.  08/22/2019, 7:26 PM

## 2019-08-22 NOTE — Progress Notes (Signed)
[facility] Progress Note  Subjective: creat stable at 2.00 today, 3.9 L in yest and 1.3 L in today, 450 cc UOP today.  Wt's are up 8 kg from admit wt.    Vitals:   08/21/19 2140 08/22/19 0500 08/22/19 0513 08/22/19 1248  BP: 137/76  (!) 144/81 130/65  Pulse: 97  99 95  Resp: _0 Temp: 98.3 F (36.8 C)  98.1 F (36.7 C) 98.3 F (36.8 C)  TempSrc: Axillary  Oral Oral  SpO2: 98%  96% 95%  Weight:  (!) 155.5 kg    Height:        Exam: Gen no distress, calm No jvd or bruits, flat Chest clear bilat to bases, no rales RRR no MRG  Abd soft ntnd no mass or ascites  Ext trace-1+ pedal edema Neuro is alert, Ox 3 , nf  Date              Creat               eGFR  2011- 15         0.80- 1.13  2017               0.88- 1.25  2018               0.99- 1.69  2019               1.03- 1.75  07/2018            1.40- 1.82  12/2018          1.39- 1.83  01/2019          1.39- 1.83  02/2019          1.33- 1.47  03/2019            1.34- 2.17  3/21                1.29- 1.93  4/21                1.27- 2.70  5/21                1.10- 2.10        30- >60  08/19/19           1.61                 41   6/14               1.52                 44   6/15               1.99                 32    Home meds:  - torsemide 40 qd/ lipitor 10/ eliquis 5 bid/ hydralazine 25 tid/ imdur 60 qd/ metoprolol xl 25 qd/ sl ntg prn  - insulin lantus 45u hs/ novolog 15u tid ac  - protonix 40 bid  - xanax 86m bid prn/ duloxetine 40 bid  - prn bentyl/ prn zofran  - prn's/ vitamins/ supplements    UA 07/21/19 - clear , 20 ketone, 6.0, >300 prot, no bact, 6-10 rbc/ 0-5 wbc   UA 06/29/19 - clear , >500 glu, 5.0, 100 prot, 0-5 rbc/ wbc    UA here - hazy, > 300 prot, rare bact, 11-20 rbc/ wbc    UNa  21,  UCr 207    CT abd 08/03/19 - Adrenals/Urinary Tract: The adrenal glands and kidneys are Unremarkable.   CXR 6/13 - no acute disease    Renal US 03/14/19 for AKI >> 11.8/ 11.9 cm kidneys w/o hydro,  normal bladder    Renal US  06/10/19 >>  12 cm / 12 cm kidneys w/ normal echo, no hydro,nl bladder   ECHO 01/26/19 - EF 40-45% , down from prior, no RV dysfunction    ECHO  07/23/19 -  Assessment/ Plan: 1. AoCKD 3a - baseline creat around 1.3- 1.8, eGFR of 36- 60.  Appears to have had multiple AKI episodes over the past several years. Has rec'd 8 L of IVf's in 4 days and lower legs / feet are now painful w/ edema, will dc IVF"s and resume her home torsemide dose.  UA +rbc's/ wbc's and proteinuria. Will get UP/C ratio. No other new suggestions. Will follow.  2. HTN - BP's normal today, on lower dose metoprolol, imdur 3. Volume - vol up after IVF"s here, will resume pt's home torsemide at 40 qd 4. Atrial fib - getting metoprolol and eliquis , in NSR, HR 90's 5. Nausea/ vomiting - poss gastroparesis 6. Chronic pain - per pmd 7. DM2 insulin dependent 8. Depression 9. Morbid obesity     Rob Willow Shidler 08/22/2019, 2:53 PM   Recent Labs  Lab 08/20/19 0853 08/20/19 0853 08/21/19 0523 08/22/19 0911  K 3.3*   < > 4.3 3.6  BUN 23*   < > 24* 23*  CREATININE 1.52*   < > 1.99* 2.00*  CALCIUM 8.2*   < > 7.9* 8.1*  HGB 9.1*  --  11.3*  --    < > = values in this interval not displayed.   Inpatient medications: . apixaban  5 mg Oral BID  . atorvastatin  10 mg Oral Daily  . DULoxetine  40 mg Oral Daily  . insulin aspart  0-20 Units Subcutaneous TID PC & HS  . insulin glargine  30 Units Subcutaneous QHS  . isosorbide mononitrate  30 mg Oral Daily  . loratadine  10 mg Oral Daily  . melatonin  6 mg Oral QHS  . metoprolol succinate  12.5 mg Oral Daily  . pantoprazole  40 mg Oral BID   . sodium chloride 100 mL/hr at 08/22/19 0629   albuterol, ALPRAZolam, dicyclomine, diphenhydrAMINE, fluticasone, HYDROmorphone (DILAUDID) injection, loperamide, magnesium hydroxide, nitroGLYCERIN, ondansetron **OR** ondansetron (ZOFRAN) IV, ondansetron, traZODone

## 2019-08-23 ENCOUNTER — Inpatient Hospital Stay (HOSPITAL_COMMUNITY): Payer: Medicare Other

## 2019-08-23 ENCOUNTER — Ambulatory Visit: Payer: Medicare Other | Admitting: Family Medicine

## 2019-08-23 DIAGNOSIS — R609 Edema, unspecified: Secondary | ICD-10-CM

## 2019-08-23 LAB — CBC WITH DIFFERENTIAL/PLATELET
Abs Immature Granulocytes: 0.05 10*3/uL (ref 0.00–0.07)
Basophils Absolute: 0 10*3/uL (ref 0.0–0.1)
Basophils Relative: 0 %
Eosinophils Absolute: 0.2 10*3/uL (ref 0.0–0.5)
Eosinophils Relative: 2 %
HCT: 23.8 % — ABNORMAL LOW (ref 36.0–46.0)
Hemoglobin: 7.3 g/dL — ABNORMAL LOW (ref 12.0–15.0)
Immature Granulocytes: 0 %
Lymphocytes Relative: 15 %
Lymphs Abs: 1.6 10*3/uL (ref 0.7–4.0)
MCH: 27.2 pg (ref 26.0–34.0)
MCHC: 30.7 g/dL (ref 30.0–36.0)
MCV: 88.8 fL (ref 80.0–100.0)
Monocytes Absolute: 0.8 10*3/uL (ref 0.1–1.0)
Monocytes Relative: 7 %
Neutro Abs: 8.5 10*3/uL — ABNORMAL HIGH (ref 1.7–7.7)
Neutrophils Relative %: 76 %
Platelets: 275 10*3/uL (ref 150–400)
RBC: 2.68 MIL/uL — ABNORMAL LOW (ref 3.87–5.11)
RDW: 14.7 % (ref 11.5–15.5)
WBC: 11.2 10*3/uL — ABNORMAL HIGH (ref 4.0–10.5)
nRBC: 0 % (ref 0.0–0.2)

## 2019-08-23 LAB — HEMOGLOBIN AND HEMATOCRIT, BLOOD
HCT: 23.8 % — ABNORMAL LOW (ref 36.0–46.0)
Hemoglobin: 7.3 g/dL — ABNORMAL LOW (ref 12.0–15.0)

## 2019-08-23 LAB — COMPREHENSIVE METABOLIC PANEL
ALT: 11 U/L (ref 0–44)
AST: 9 U/L — ABNORMAL LOW (ref 15–41)
Albumin: 2.5 g/dL — ABNORMAL LOW (ref 3.5–5.0)
Alkaline Phosphatase: 88 U/L (ref 38–126)
Anion gap: 9 (ref 5–15)
BUN: 27 mg/dL — ABNORMAL HIGH (ref 6–20)
CO2: 19 mmol/L — ABNORMAL LOW (ref 22–32)
Calcium: 8 mg/dL — ABNORMAL LOW (ref 8.9–10.3)
Chloride: 105 mmol/L (ref 98–111)
Creatinine, Ser: 2.05 mg/dL — ABNORMAL HIGH (ref 0.44–1.00)
GFR calc Af Amer: 31 mL/min — ABNORMAL LOW (ref >60)
GFR calc non Af Amer: 27 mL/min — ABNORMAL LOW (ref >60)
Glucose, Bld: 237 mg/dL — ABNORMAL HIGH (ref 70–99)
Potassium: 3.7 mmol/L (ref 3.5–5.1)
Sodium: 133 mmol/L — ABNORMAL LOW (ref 135–145)
Total Bilirubin: 0.2 mg/dL — ABNORMAL LOW (ref 0.3–1.2)
Total Protein: 6 g/dL — ABNORMAL LOW (ref 6.5–8.1)

## 2019-08-23 LAB — PROTEIN / CREATININE RATIO, URINE
Creatinine, Urine: 82.31 mg/dL
Protein Creatinine Ratio: 1.69 mg/mg{Cre} — ABNORMAL HIGH (ref 0.00–0.15)
Total Protein, Urine: 139 mg/dL

## 2019-08-23 LAB — MAGNESIUM: Magnesium: 1.7 mg/dL (ref 1.7–2.4)

## 2019-08-23 LAB — GLUCOSE, CAPILLARY
Glucose-Capillary: 202 mg/dL — ABNORMAL HIGH (ref 70–99)
Glucose-Capillary: 166 mg/dL — ABNORMAL HIGH (ref 70–99)
Glucose-Capillary: 230 mg/dL — ABNORMAL HIGH (ref 70–99)
Glucose-Capillary: 312 mg/dL — ABNORMAL HIGH (ref 70–99)

## 2019-08-23 LAB — TYPE AND SCREEN
ABO/RH(D): O POS
Antibody Screen: NEGATIVE

## 2019-08-23 LAB — PHOSPHORUS: Phosphorus: 4.1 mg/dL (ref 2.5–4.6)

## 2019-08-23 LAB — OCCULT BLOOD X 1 CARD TO LAB, STOOL: Fecal Occult Bld: POSITIVE — AB

## 2019-08-23 MED ORDER — SODIUM CHLORIDE 0.9 % IV SOLN
250.0000 mg | Freq: Three times a day (TID) | INTRAVENOUS | Status: DC
  Administered 2019-08-23 – 2019-08-28 (×16): 250 mg via INTRAVENOUS
  Filled 2019-08-23 (×20): qty 5

## 2019-08-23 MED ORDER — SODIUM CHLORIDE 0.9 % IV SOLN
INTRAVENOUS | Status: DC | PRN
  Administered 2019-08-23: 250 mL via INTRAVENOUS

## 2019-08-23 NOTE — Progress Notes (Signed)
[facility] Progress Note  Subjective: creat unchanged at 2.05 today, UOP 450 yest and 1000 today. Pt w/o new c/o's.   Vitals:   08/22/19 2021 08/23/19 0551 08/23/19 0553 08/23/19 1255  BP: (!) 127/57 113/68  115/64  Pulse: 98 99  89  Resp: _0 Temp: 98.3 F (36.8 C) 98.1 F (36.7 C)  98.3 F (36.8 C)  TempSrc: Oral Oral  Oral  SpO2: 93% 98%  95%  Weight:   (!) 154.8 kg   Height:        Exam: Gen no distress, calm No jvd or bruits, flat Chest clear bilat to bases, no rales RRR no MRG  Abd soft ntnd no mass or ascites  Ext 1-2 + increased pretibial/ pedal edema Neuro is alert, Ox 3 , nf  Date              Creat               eGFR  2011- 15         0.80- 1.13  2017               0.88- 1.25  2018               0.99- 1.69  2019               1.03- 1.75  07/2018            1.40- 1.82  12/2018          1.39- 1.83  01/2019          1.39- 1.83  02/2019          1.33- 1.47  03/2019            1.34- 2.17  3/21                1.29- 1.93  4/21                1.27- 2.70  5/21                1.10- 2.10        30- >60  08/19/19           1.61                 41   6/14               1.52                 44   6/15               1.99                 32    Home meds:  - torsemide 40 qd/ lipitor 10/ eliquis 5 bid/ hydralazine 25 tid/ imdur 60 qd/ metoprolol xl 25 qd/ sl ntg prn  - insulin lantus 45u hs/ novolog 15u tid ac  - protonix 40 bid  - xanax 60m bid prn/ duloxetine 40 bid  - prn bentyl/ prn zofran  - prn's/ vitamins/ supplements    UA 07/21/19 - clear , 20 ketone, 6.0, >300 prot, no bact, 6-10 rbc/ 0-5 wbc   UA 06/29/19 - clear , >500 glu, 5.0, 100 prot, 0-5 rbc/ wbc    UA 08/22/19 - hazy, > 300 prot, rare bact, 11-20 rbc/ wbc    UNa 21,  UCr 207    CT abd 08/03/19 -  Adrenals/Urinary Tract: The adrenal glands and kidneys are Unremarkable.   CXR 6/13 - no acute disease    Renal US 03/14/19 for AKI >> 11.8/ 11.9 cm kidneys w/o hydro, normal bladder     Renal US  06/10/19 >>  12 cm / 12 cm kidneys w/ normal echo, no hydro,nl bladder   ECHO 01/26/19 - EF 40-45% , down from prior, no RV dysfunction    ECHO 07/23/19 - EF 40-45%, RV syst okay, no valve issues    UP/C ratio - 1.7   Assessment/ Plan: 1. AoCKD 3 - baseline creat around 1.3- 1.8, eGFR of 36- 60.  SP multiple AKI episodes over the past several years. Could have some cardiorenal component (frequent AKI episodes, EF 40-45%). UP/C ratio 1.7 c/w diab nephropathy most likely. Creat 1.6 > 1.9 here, we gave IVF"s then edema and creat worsened yest to 2.0, so IVF's stopped and started back on home torsemide 40 / day.  Creat stable today. May be new baseline.  She had hospital f/u in May but cancelled the appt. Our office will reschedule another hospital f/u visit.  No further suggestions, will sign off.  2. HTN - BP's normal today, on lower dose metoprolol, imdur 3. Volume - vol up slightly, we resumed pt's home torsemide at 40 qd 4. Atrial fib - getting metoprolol and eliquis , in NSR, HR 90's 5. Nausea/ vomiting - poss gastroparesis 6. Chronic pain - per pmd 7. DM2 insulin dependent 8. Depression 9. Morbid obesity     Rob Arryn Terrones 08/23/2019, 2:45 PM   Recent Labs  Lab 08/21/19 0523 08/22/19 0911 08/23/19 0515 08/23/19 1004  K  --  3.6 3.7  --   BUN  --  23* 27*  --   CREATININE  --  2.00* 2.05*  --   CALCIUM  --  8.1* 8.0*  --   PHOS  --   --  4.1  --   HGB   < >  --  7.3* 7.3*   < > = values in this interval not displayed.   Inpatient medications: . apixaban  5 mg Oral BID  . atorvastatin  10 mg Oral Daily  . DULoxetine  40 mg Oral Daily  . insulin aspart  0-20 Units Subcutaneous TID PC & HS  . insulin glargine  30 Units Subcutaneous QHS  . isosorbide mononitrate  30 mg Oral Daily  . loratadine  10 mg Oral Daily  . melatonin  6 mg Oral QHS  . metoprolol succinate  12.5 mg Oral Daily  . pantoprazole  40 mg Oral BID  . torsemide  40 mg Oral Daily   . erythromycin      albuterol, ALPRAZolam, dicyclomine, diphenhydrAMINE, fluticasone, HYDROmorphone (DILAUDID) injection, loperamide, magnesium hydroxide, nitroGLYCERIN, ondansetron **OR** ondansetron (ZOFRAN) IV, ondansetron, traZODone

## 2019-08-23 NOTE — Progress Notes (Signed)
Inpatient Diabetes Program Recommendations  AACE/ADA: New Consensus Statement on Inpatient Glycemic Control (2015)  Target Ranges:  Prepandial:   less than 140 mg/dL      Peak postprandial:   less than 180 mg/dL (1-2 hours)      Critically ill patients:  140 - 180 mg/dL   Lab Results  Component Value Date   GLUCAP 202 (H) 08/23/2019   HGBA1C 8.7 (H) 07/22/2019    Review of Glycemic Control  FBS above goal of 140-180 mg/dL. May benefit from increase in Lantus. Add meal coverage insulin if eating > 50% meal  Inpatient Diabetes Program Recommendations:     Increase Lantus to 32 units QHS May need Novolog 3 units tidwc for meal coverage insulin if post-prandials > 180 mg/dL.  Continue to follow.   Thank you. Lorenda Peck, RD, LDN, CDE Inpatient Diabetes Coordinator [phone]

## 2019-08-23 NOTE — Progress Notes (Signed)
VASCULAR LAB PRELIMINARY  PRELIMINARY  PRELIMINARY  PRELIMINARY  Bilateral lower extremity venous duplex completed.    Preliminary report:  See CV proc for preliminary results.  Mohit Zirbes, RVT 08/23/2019, 12:21 PM

## 2019-08-23 NOTE — Progress Notes (Signed)
Attempted midline placement per patient request after previous midline infiltrated...attempt unsuccessful. Patient currently has working peripheral I.V. in A/C, but is not happy with placement of current I.V. and being stuck for lab work. RN aware that patient request PICC.

## 2019-08-23 NOTE — Progress Notes (Signed)
PROGRESS NOTE    Deborah [name]  WLN:[id number] DOB: [birth year] DOA: 08/19/2019 PCP: [name], DO   Chief Complaint  Patient presents with  . [name]  . Chest Pain  . Emesis    Brief Narrative:  [name] y.o.African-American femalewith Efrem Pitstick known history of type II obese mellitus, chronic systolic CHF, hypertension, dyslipidemia and GERD as well as gastroparesis, presented to the emergency room with acute onset of intractable vomiting with associated epigastric and lower abdominal pain. Denies hematemesis or malena. Also had chest pain felt as pressure and graded 5/10 in severity with radiation to her left arm and associated mild dyspnea without complications.   Upon presentation to the emergency room, blood pressure was 153/105 with heart rate of 115 and RR 24 and temperature 98. Labs revealed hypokalemia of 3.1 glucose of 218 with Sharin Altidor BUN of 30 and creatinine 1.61 compared to 30/1.28 08/07/2019. Serum lipase was 34 and BNP 60.8. High-sensitivity troponin I was 26 and later 21. CBC showed minimal leukocytosis of 11.7 with neutrophilia and anemia better than previous levels. Chest x-ray showed cardiomegaly without acute cardiopulmonary disease.EKG showed sinus tachycardia with Cyleigh Massaro rate of 116 with left axis deviation poor R wave progression.  The patient was given Dilaudid 1 mg IV twice as well as 4 mg of IV Zofran and 1 L bolus of IV normal saline. She was still having recurrent epigastric pain and nausea.   Assessment & Plan:   Active Problems:   Diabetic gastroparesis ([place])  1. Diabetic gastroparesis with intractable epigastric pain as well as recurrent nausea and vomiting. - Continue hydration with IV normal saline. - Continue as needed IV Zofran and Phenergan. Apparently she has not tolerated Reglan in the past. It apparently gave her extraparametal symptoms and tachycardia.   - trial of erythromycin  - CT without acute findings -IV  dilaudid prn, will need to taper this and try to limit opiates -Continue Protonix. -On clear liquids to be advanced as tolerated. - CT from 5/28 with 15 mm metallic density in region of duodenal bulb - consider repeat imaging if persistent pain  # Anemia: Hb downtrended to 7.3 today.  Positive FOBT, but no obvious blood in stool per discussion with RN.  Continue to trend H/H.  Vitals stable. Follow iron, b12, folate, ferritin  2. Hypokalemia/hypomagnesemia. - improved, follow  3. Acute kidney injury. -creatinine 2.05, stable, baseline ~1.3-1.8 - Per holding further IVF and resuming torsemide with LE edema in setting of IVF (+7.7 L) - follow UP/C (1.69) - UA with RBC's, WBC's and proteinuria - follow outpatient - per renal, ? New baseline  4. Chest pain, likely atypical. - troponin flat, low  - suspect this is related to epigastric pain - consider additional w/u if worsening or concerning symptoms  5.Type 2 diabetes mellitus. -Continue lantus 30 units daily (lower dose) -Consulted diabetes coordinator.  6. Hypertension. -On Imdur, Toprol-XL which is supposed to be her home medications.  Hydral on hold.  Torsemide resumed.  7. Dyslipidemia. -We will continue statin therapy.  8. Anxiety. -Continue Xanax.  9.COPD without exacerbation. -Continue her nebulizer therapy.  # LE edema: torsemide resumed per renal, follow LE [place] (low suspicion as on anticoagulation) - negative for DVT  # Atrial Fibrillation: continue eliquis  DVT prophylaxis: eliquis Code Status: full  Family Communication: none at bedside Disposition:   Status is: Inpatient  Remains inpatient appropriate because:Inpatient level of care appropriate due to severity of illness   Dispo: The patient is from:  Home              Anticipated d/c is to: Home              Anticipated d/c date is: 2 days              Patient currently is not medically stable to d/c.   Consultants:    nephrology  Procedures:  LE [place] Summary:  RIGHT:  - Findings appear essentially unchanged compared to previous examination.  - There is no evidence of deep vein thrombosis in the lower extremity.  However, portions of this examination were limited- see technologist  comments above.    LEFT:  - Findings appear essentially unchanged compared to previous examination.  - There is no evidence of deep vein thrombosis in the lower extremity.  However, portions of this examination were limited- see technologist  comments above.   Antimicrobials:  Anti-infectives (From admission, onward)   Start     Dose/Rate Route Frequency Ordered Stop   08/23/19 1300  erythromycin 250 mg in sodium chloride 0.9 % 100 mL IVPB     Discontinue     250 mg 100 mL/hr over 60 Minutes Intravenous Every 8 hours 08/23/19 1152       Subjective: No new complaints  Objective: Vitals:   08/22/19 2021 08/23/19 0551 08/23/19 0553 08/23/19 1255  BP: (!) 127/57 113/68  115/64  Pulse: 98 99  89  Resp: 20 20  16   Temp: 98.3 F (36.8 C) 98.1 F (36.7 C)  98.3 F (36.8 C)  TempSrc: Oral Oral  Oral  SpO2: 93% 98%  95%  Weight:   (!) 154.8 kg   Height:        Intake/Output Summary (Last 24 hours) at 08/23/2019 1914 Last data filed at 08/23/2019 1615 Gross per 24 hour  Intake 720 ml  Output 1300 ml  Net -580 ml   Filed Weights   08/19/19 2300 08/22/19 0500 08/23/19 0553  Weight: (!) 147.6 kg (!) 155.5 kg (!) 154.8 kg    Examination:  General: No acute distress. Cardiovascular: Heart sounds show Jood Retana regular rate, and rhythm. Lungs: Clear to auscultation bilaterally Abdomen: Soft, mildly diffusely tender to palpation Neurological: Alert and oriented 3. Moves all extremities 4 . Cranial nerves II through XII grossly intact. Skin: Warm and dry. No rashes or lesions. Extremities: No clubbing or cyanosis. Bilateral LE edema.    Data Reviewed: I have personally reviewed following labs and imaging  studies  CBC: Recent Labs  Lab 08/19/19 1619 08/20/19 0853 08/21/19 0523 08/23/19 0515 08/23/19 1004  WBC 11.7* 11.8* 10.0 11.2*  --   NEUTROABS 8.9*  --   --  8.5*  --   HGB 11.1* 9.1* 11.3* 7.3* 7.3*  HCT 35.4* 28.9* 36.1 23.8* 23.8*  MCV 84.9 87.0 86.8 88.8  --   PLT 436* 348 268 275  --     Basic Metabolic Panel: Recent Labs  Lab 08/19/19 1619 08/19/19 1901 08/20/19 0853 08/21/19 0523 08/22/19 0911 08/23/19 0515  NA 141  --  141 142 134* 133*  K 3.1*  --  3.3* 4.3 3.6 3.7  CL 107  --  108 113* 106 105  CO2 20*  --  23 19* 21* 19*  GLUCOSE 218*  --  204* 137* 147* 237*  BUN 27*  --  23* 24* 23* 27*  CREATININE 1.61*  --  1.52* 1.99* 2.00* 2.05*  CALCIUM 9.5  --  8.2* 7.9* 8.1* 8.0*  MG  --  1.2*  --  1.6* 1.8 1.7  PHOS  --   --   --   --   --  4.1    GFR: Estimated Creatinine Clearance: 47.7 mL/min (Moishe Schellenberg) (by C-G formula based on SCr of 2.05 mg/dL (H)).  Liver Function Tests: Recent Labs  Lab 08/19/19 1619 08/22/19 0911 08/23/19 0515  AST 21 11* 9*  ALT 20 14 11   ALKPHOS 106 86 88  BILITOT 0.8 0.6 0.2*  PROT 8.3* 6.6 6.0*  ALBUMIN 3.5 2.9* 2.5*    CBG: Recent Labs  Lab 08/22/19 1633 08/22/19 2021 08/23/19 0749 08/23/19 1156 08/23/19 1630  GLUCAP 234* 205* 202* 166* 230*     Recent Results (from the past 240 hour(s))  SARS Coronavirus 2 by RT PCR (hospital order, performed in [facility] hospital lab) Nasopharyngeal Nasopharyngeal Swab     Status: None   Collection Time: 08/19/19  7:23 PM   Specimen: Nasopharyngeal Swab  Result Value Ref Range Status   SARS Coronavirus 2 NEGATIVE NEGATIVE Final    Comment: (NOTE) SARS-CoV-2 target nucleic acids are NOT DETECTED.  The SARS-CoV-2 RNA is generally detectable in upper and lower respiratory specimens during the acute phase of infection. The lowest concentration of SARS-CoV-2 viral copies this assay can detect is 250 copies / mL. Vahe Pienta negative result does not preclude SARS-CoV-2 infection and  should not be used as the sole basis for treatment or other patient management decisions.  Dellar Traber negative result may occur with improper specimen collection / handling, submission of specimen other than nasopharyngeal swab, presence of viral mutation(s) within the areas targeted by this assay, and inadequate number of viral copies (<250 copies / mL). Coyle Stordahl negative result must be combined with clinical observations, patient history, and epidemiological information.  Fact Sheet for Patients:   StrictlyIdeas.no  Fact Sheet for Healthcare Providers: BankingDealers.co.za  This test is not yet approved or  cleared by the [place] FDA and has been authorized for detection and/or diagnosis of SARS-CoV-2 by FDA under an Emergency Use Authorization (EUA).  This EUA will remain in effect (meaning this test can be used) for the duration of the COVID-19 declaration under Section 564(b)(1) of the Act, 21 U.S.C. section 360bbb-3(b)(1), unless the authorization is terminated or revoked sooner.  Performed at [facility], [place] [address] [place], [place] [address]          Radiology Studies: CT ABDOMEN PELVIS WO CONTRAST  Result Date: 08/23/2019 CLINICAL DATA:  Acute epigastric abdominal pain. EXAM: CT ABDOMEN AND PELVIS WITHOUT CONTRAST TECHNIQUE: Multidetector CT imaging of the abdomen and pelvis was performed following the standard protocol without IV contrast. COMPARISON:  Aug 03, 2019.  Jul 22, 2019. FINDINGS: Lower chest: No acute abnormality. Hepatobiliary: No focal liver abnormality is seen. Status post cholecystectomy. No biliary dilatation. Pancreas: Unremarkable. No pancreatic ductal dilatation or surrounding inflammatory changes. Spleen: Normal in size without focal abnormality. Adrenals/Urinary Tract: Adrenal glands are unremarkable. Kidneys are normal, without renal calculi, focal lesion, or hydronephrosis. Bladder is  unremarkable. Stomach/Bowel: Stomach is within normal limits. Appendix appears normal. No evidence of bowel wall thickening, distention, or inflammatory changes. Vascular/Lymphatic: No significant vascular findings are present. No enlarged abdominal or pelvic lymph nodes. Reproductive: No adnexal abnormality is noted. Stable uterine fibroid is noted. Other: No abdominal wall hernia or abnormality. No abdominopelvic ascites. Musculoskeletal: No acute or significant osseous findings. IMPRESSION: 1. Stable uterine fibroid. 2. No other abnormality seen in the abdomen or pelvis. Electronically  Signed   By: Marijo Conception M.D.   On: 08/23/2019 16:11   VAS [place] LOWER EXTREMITY VENOUS (DVT)  Result Date: 08/23/2019  Lower Venous DVTStudy Indications: Swelling.  Limitations: Body habitus and pain with compression. Comparison Study: Prior LLE venous duplex from 05/30/15 and RLE venous duplex                   from 07/19/14, are available for comparison Performing Technologist: Sharion Dove RVS  Examination Guidelines: Erez Mccallum complete evaluation includes B-mode imaging, spectral Doppler, color Doppler, and power Doppler as needed of all accessible portions of each vessel. Bilateral testing is considered an integral part of Kean Gautreau complete examination. Limited examinations for reoccurring indications may be performed as noted. The reflux portion of the exam is performed with the patient in reverse Trendelenburg.  +---------+---------------+---------+-----------+----------+-------------------+ RIGHT    CompressibilityPhasicitySpontaneityPropertiesThrombus Aging      +---------+---------------+---------+-----------+----------+-------------------+ CFV      Full           Yes      Yes                                      +---------+---------------+---------+-----------+----------+-------------------+ SFJ      Full                                                              +---------+---------------+---------+-----------+----------+-------------------+ FV Prox  Full                                                             +---------+---------------+---------+-----------+----------+-------------------+ FV Mid   Full                                                             +---------+---------------+---------+-----------+----------+-------------------+ FV Distal               Yes      Yes                  patent by color and                                                       Doppler             +---------+---------------+---------+-----------+----------+-------------------+ PFV      Full                                                             +---------+---------------+---------+-----------+----------+-------------------+ POP  Full           Yes      Yes                                      +---------+---------------+---------+-----------+----------+-------------------+ PTV      Full           Yes      Yes                                      +---------+---------------+---------+-----------+----------+-------------------+ PERO                                                  Not visualized      +---------+---------------+---------+-----------+----------+-------------------+   +---------+---------------+---------+-----------+----------+-------------------+ LEFT     CompressibilityPhasicitySpontaneityPropertiesThrombus Aging      +---------+---------------+---------+-----------+----------+-------------------+ CFV      Full           Yes      Yes                                      +---------+---------------+---------+-----------+----------+-------------------+ SFJ      Full                                                             +---------+---------------+---------+-----------+----------+-------------------+ FV Prox  Full                                                              +---------+---------------+---------+-----------+----------+-------------------+ FV Mid   Full                                                             +---------+---------------+---------+-----------+----------+-------------------+ FV Distal               Yes      Yes                  patent by color and                                                       Doppler             +---------+---------------+---------+-----------+----------+-------------------+ POP      Full           Yes      Yes                                      +---------+---------------+---------+-----------+----------+-------------------+  PTV      Full                                                             +---------+---------------+---------+-----------+----------+-------------------+ PERO                                                  Not visualized      +---------+---------------+---------+-----------+----------+-------------------+     Summary: RIGHT: - Findings appear essentially unchanged compared to previous examination. - There is no evidence of deep vein thrombosis in the lower extremity. However, portions of this examination were limited- see technologist comments above.  LEFT: - Findings appear essentially unchanged compared to previous examination. - There is no evidence of deep vein thrombosis in the lower extremity. However, portions of this examination were limited- see technologist comments above.  - Carrson Lightcap cystic structure is found in the popliteal fossa.  *See table(s) above for measurements and observations. Electronically signed by Monica Martinez MD on 08/23/2019 at 3:20:02 PM.    Final         Scheduled Meds: . apixaban  5 mg Oral BID  . atorvastatin  10 mg Oral Daily  . DULoxetine  40 mg Oral Daily  . insulin aspart  0-20 Units Subcutaneous TID PC & HS  . insulin glargine  30 Units Subcutaneous QHS  . isosorbide mononitrate  30 mg Oral Daily  . loratadine  10 mg  Oral Daily  . melatonin  6 mg Oral QHS  . metoprolol succinate  12.5 mg Oral Daily  . pantoprazole  40 mg Oral BID  . torsemide  40 mg Oral Daily   Continuous Infusions: . erythromycin Stopped (08/23/19 1500)     LOS: 3 days    Time spent: over 30 min    Fayrene Helper, MD [facility]   To contact the attending provider between 7A-7P or the covering provider during after hours 7P-7A, please log into the web site [email or web address] and access using universal [place] password for that web site. If you do not have the password, please call the hospital operator.  08/23/2019, 7:14 PM

## 2019-08-23 NOTE — Care Management Important Message (Signed)
Important Message  Patient Details IM Letter given to Dessa Phi RN Case Manager to present to the Patient Name: Deborah [name] MRN: [id number] Date of Birth: [birth year]   Medicare Important Message Given:  Yes     Kerin Salen 08/23/2019, 10:36 AM

## 2019-08-24 LAB — CBC WITH DIFFERENTIAL/PLATELET
Abs Immature Granulocytes: 0.1 10*3/uL — ABNORMAL HIGH (ref 0.00–0.07)
Basophils Absolute: 0 10*3/uL (ref 0.0–0.1)
Basophils Relative: 0 %
Eosinophils Absolute: 0.3 10*3/uL (ref 0.0–0.5)
Eosinophils Relative: 3 %
HCT: 25.9 % — ABNORMAL LOW (ref 36.0–46.0)
Hemoglobin: 8.1 g/dL — ABNORMAL LOW (ref 12.0–15.0)
Immature Granulocytes: 1 %
Lymphocytes Relative: 14 %
Lymphs Abs: 1.5 10*3/uL (ref 0.7–4.0)
MCH: 27.6 pg (ref 26.0–34.0)
MCHC: 31.3 g/dL (ref 30.0–36.0)
MCV: 88.1 fL (ref 80.0–100.0)
Monocytes Absolute: 0.7 10*3/uL (ref 0.1–1.0)
Monocytes Relative: 7 %
Neutro Abs: 8.3 10*3/uL — ABNORMAL HIGH (ref 1.7–7.7)
Neutrophils Relative %: 75 %
Platelets: 288 10*3/uL (ref 150–400)
RBC: 2.94 MIL/uL — ABNORMAL LOW (ref 3.87–5.11)
RDW: 14.6 % (ref 11.5–15.5)
WBC: 11 10*3/uL — ABNORMAL HIGH (ref 4.0–10.5)
nRBC: 0 % (ref 0.0–0.2)

## 2019-08-24 LAB — GLUCOSE, CAPILLARY
Glucose-Capillary: 170 mg/dL — ABNORMAL HIGH (ref 70–99)
Glucose-Capillary: 151 mg/dL — ABNORMAL HIGH (ref 70–99)
Glucose-Capillary: 323 mg/dL — ABNORMAL HIGH (ref 70–99)
Glucose-Capillary: 192 mg/dL — ABNORMAL HIGH (ref 70–99)

## 2019-08-24 LAB — COMPREHENSIVE METABOLIC PANEL
ALT: 12 U/L (ref 0–44)
AST: 9 U/L — ABNORMAL LOW (ref 15–41)
Albumin: 2.8 g/dL — ABNORMAL LOW (ref 3.5–5.0)
Alkaline Phosphatase: 91 U/L (ref 38–126)
Anion gap: 7 (ref 5–15)
BUN: 26 mg/dL — ABNORMAL HIGH (ref 6–20)
CO2: 22 mmol/L (ref 22–32)
Calcium: 8.5 mg/dL — ABNORMAL LOW (ref 8.9–10.3)
Chloride: 106 mmol/L (ref 98–111)
Creatinine, Ser: 1.97 mg/dL — ABNORMAL HIGH (ref 0.44–1.00)
GFR calc Af Amer: 32 mL/min — ABNORMAL LOW (ref >60)
GFR calc non Af Amer: 28 mL/min — ABNORMAL LOW (ref >60)
Glucose, Bld: 178 mg/dL — ABNORMAL HIGH (ref 70–99)
Potassium: 3.3 mmol/L — ABNORMAL LOW (ref 3.5–5.1)
Sodium: 135 mmol/L (ref 135–145)
Total Bilirubin: 0.5 mg/dL (ref 0.3–1.2)
Total Protein: 6.9 g/dL (ref 6.5–8.1)

## 2019-08-24 LAB — FERRITIN: Ferritin: 98 ng/mL (ref 11–307)

## 2019-08-24 LAB — IRON AND TIBC
Iron: 12 ug/dL — ABNORMAL LOW (ref 28–170)
Saturation Ratios: 4 % — ABNORMAL LOW (ref 10.4–31.8)
TIBC: 296 ug/dL (ref 250–450)
UIBC: 284 ug/dL

## 2019-08-24 LAB — MAGNESIUM: Magnesium: 1.5 mg/dL — ABNORMAL LOW (ref 1.7–2.4)

## 2019-08-24 LAB — FOLATE: Folate: 6 ng/mL (ref >5.9)

## 2019-08-24 LAB — PHOSPHORUS: Phosphorus: 3.5 mg/dL (ref 2.5–4.6)

## 2019-08-24 LAB — VITAMIN B12: Vitamin B-12: 410 pg/mL (ref 180–914)

## 2019-08-24 MED ORDER — OXYCODONE HCL 5 MG PO TABS: 5.0000 mg | ORAL_TABLET | ORAL | Status: DC | PRN

## 2019-08-24 MED ORDER — INSULIN ASPART 100 UNIT/ML ~~LOC~~ SOLN
4.0000 [IU] | Freq: Three times a day (TID) | SUBCUTANEOUS | Status: DC
  Administered 2019-08-24 – 2019-08-30 (×17): 4 [IU] via SUBCUTANEOUS

## 2019-08-24 MED ORDER — HYDROMORPHONE HCL 1 MG/ML IJ SOLN
0.5000 mg | Freq: Four times a day (QID) | INTRAMUSCULAR | Status: DC | PRN
  Administered 2019-08-24 – 2019-08-25 (×4): 0.5 mg via INTRAVENOUS
  Filled 2019-08-24 (×4): qty 0.5

## 2019-08-24 MED ORDER — POTASSIUM CHLORIDE CRYS ER 20 MEQ PO TBCR
40.0000 meq | EXTENDED_RELEASE_TABLET | Freq: Once | ORAL | Status: AC
  Administered 2019-08-24: 40 meq via ORAL
  Filled 2019-08-24: qty 2

## 2019-08-24 MED ORDER — HYDROMORPHONE HCL 1 MG/ML IJ SOLN: 1.0000 mg | Freq: Four times a day (QID) | INTRAMUSCULAR | Status: DC | PRN

## 2019-08-24 MED ORDER — MAGNESIUM SULFATE IN D5W 1-5 GM/100ML-% IV SOLN
1.0000 g | Freq: Once | INTRAVENOUS | Status: AC
  Administered 2019-08-24: 1 g via INTRAVENOUS
  Filled 2019-08-24: qty 100

## 2019-08-24 NOTE — Plan of Care (Signed)
  Problem: Health Behavior/Discharge Planning: Goal: Ability to manage health-related needs will improve Outcome: Progressing   Problem: Clinical Measurements: Goal: Ability to maintain clinical measurements within normal limits will improve Outcome: Progressing Goal: Will remain free from infection Outcome: Progressing Goal: Diagnostic test results will improve Outcome: Progressing Goal: Cardiovascular complication will be avoided Outcome: Progressing   Problem: Activity: Goal: Risk for activity intolerance will decrease Outcome: Progressing   Problem: Nutrition: Goal: Adequate nutrition will be maintained Outcome: Progressing   Problem: Coping: Goal: Level of anxiety will decrease Outcome: Progressing   Problem: Pain Managment: Goal: General experience of comfort will improve Outcome: Progressing   Problem: Safety: Goal: Ability to remain free from injury will improve Outcome: Progressing   Problem: Skin Integrity: Goal: Risk for impaired skin integrity will decrease Outcome: Progressing   Problem: Health Behavior/Discharge Planning: Goal: Ability to manage health-related needs will improve Outcome: Progressing   Problem: Clinical Measurements: Goal: Ability to maintain clinical measurements within normal limits will improve Outcome: Progressing Goal: Will remain free from infection Outcome: Progressing Goal: Diagnostic test results will improve Outcome: Progressing Goal: Cardiovascular complication will be avoided Outcome: Progressing   Problem: Activity: Goal: Risk for activity intolerance will decrease Outcome: Progressing   Problem: Nutrition: Goal: Adequate nutrition will be maintained Outcome: Progressing   Problem: Coping: Goal: Level of anxiety will decrease Outcome: Progressing   Problem: Pain Managment: Goal: General experience of comfort will improve Outcome: Progressing   Problem: Safety: Goal: Ability to remain free from injury will  improve Outcome: Progressing   Problem: Skin Integrity: Goal: Risk for impaired skin integrity will decrease Outcome: Progressing   Problem: Health Behavior/Discharge Planning: Goal: Ability to manage health-related needs will improve Outcome: Progressing   Problem: Clinical Measurements: Goal: Ability to maintain clinical measurements within normal limits will improve Outcome: Progressing Goal: Will remain free from infection Outcome: Progressing Goal: Diagnostic test results will improve Outcome: Progressing Goal: Cardiovascular complication will be avoided Outcome: Progressing   Problem: Activity: Goal: Risk for activity intolerance will decrease Outcome: Progressing   Problem: Nutrition: Goal: Adequate nutrition will be maintained Outcome: Progressing   Problem: Coping: Goal: Level of anxiety will decrease Outcome: Progressing   Problem: Pain Managment: Goal: General experience of comfort will improve Outcome: Progressing   Problem: Safety: Goal: Ability to remain free from injury will improve Outcome: Progressing   Problem: Skin Integrity: Goal: Risk for impaired skin integrity will decrease Outcome: Progressing

## 2019-08-24 NOTE — Progress Notes (Signed)
Inpatient Diabetes Program Recommendations  AACE/ADA: New Consensus Statement on Inpatient Glycemic Control (2015)  Target Ranges:  Prepandial:   less than 140 mg/dL      Peak postprandial:   less than 180 mg/dL (1-2 hours)      Critically ill patients:  140 - 180 mg/dL   Lab Results  Component Value Date   GLUCAP 170 (H) 08/24/2019   HGBA1C 8.7 (H) 07/22/2019    Review of Glycemic Control  FBS look good. Post-prandials elevated. Needs meal coverage insulin when eating well.  Inpatient Diabetes Program Recommendations:    Add Novolog 4 units tidwc for meal coverage insulin if eating > 50% meal.   Continue to follow.  Thank you. Lorenda Peck, RD, LDN, CDE Inpatient Diabetes Coordinator [phone]

## 2019-08-24 NOTE — Progress Notes (Signed)
PROGRESS NOTE    Deborah [name]  EQA:[id number] DOB: Mar 06, 1966 DOA: 08/19/2019 PCP: Deborah Alpha, DO   Chief Complaint  Patient presents with  . [name]  . Chest Pain  . Emesis    Brief Narrative:  [name] y.o.African-American femalewith Deborah [name] known history of type II obese mellitus, chronic systolic CHF, hypertension, dyslipidemia and GERD as well as gastroparesis, presented to the emergency room with acute onset of intractable vomiting with associated epigastric and lower abdominal pain. Denies hematemesis or malena. Also had chest pain felt as pressure and graded 5/10 in severity with radiation to her left arm and associated mild dyspnea without complications.   Upon presentation to the emergency room, blood pressure was 153/105 with heart rate of 115 and RR 24 and temperature 98. Labs revealed hypokalemia of 3.1 glucose of 218 with Deborah [name] BUN of 30 and creatinine 1.61 compared to 30/1.28 08/07/2019. Serum lipase was 34 and BNP 60.8. High-sensitivity troponin I was 26 and later 21. CBC showed minimal leukocytosis of 11.7 with neutrophilia and anemia better than previous levels. Chest x-ray showed cardiomegaly without acute cardiopulmonary disease.EKG showed sinus tachycardia with Deborah [name] rate of 116 with left axis deviation poor R wave progression.  The patient was given Dilaudid 1 mg IV twice as well as 4 mg of IV Zofran and 1 L bolus of IV normal saline. She was still having recurrent epigastric pain and nausea.   Assessment & Plan:   Active Problems:   Diabetic gastroparesis ([place])  1. Diabetic gastroparesis with intractable epigastric pain as well as recurrent nausea and vomiting. - Continue hydration with IV normal saline. - Continue as needed IV Zofran and Phenergan. Apparently she has not tolerated Reglan in the past. It apparently gave her extraparametal symptoms and tachycardia.   - trial of erythromycin  - CT without acute findings -  oxycodone, on IV dilaudid prn, will need to taper this and try to limit opiates -Continue Protonix. -On clear liquids to be advanced as tolerated. - CT from 5/28 with 15 mm metallic density in region of duodenal bulb - consider repeat imaging if persistent pain  # Anemia  Iron Def Anemia: Hb downtrended to 7.3 today.  Positive FOBT, but no obvious blood in stool per discussion with RN.  Continue to trend H/H.  Vitals stable. Labs suggestive of iron def anemia GI c/s, recommending holding eliquis, PPI, appreciate assistance  2. Hypokalemia/hypomagnesemia. - replace as needed  3. Acute kidney injury. -creatinine 1.97, stable, baseline ~1.3-1.8 - Per holding further IVF and resuming torsemide with LE edema in setting of IVF - follow UP/C (1.69) - UA with RBC's, WBC's and proteinuria - follow outpatient - per renal, ? New baseline  4. Chest pain, likely atypical. - troponin flat, low  - suspect this is related to epigastric pain - consider additional w/u if worsening or concerning symptoms  5.Type 2 diabetes mellitus. -Continue lantus 30 units daily (lower dose) -Consulted diabetes coordinator.  6. Hypertension. -On Imdur, Toprol-XL which is supposed to be her home medications.  Hydral on hold.  Torsemide resumed.  7. Dyslipidemia. -We will continue statin therapy.  8. Anxiety. -Continue Xanax.  9.COPD without exacerbation. -Continue her nebulizer therapy.  # LE edema: torsemide resumed per renal, follow LE [place] (low suspicion as on anticoagulation) - negative for DVT  # Atrial Fibrillation: continue eliquis - on hold   DVT prophylaxis: eliquis Code Status: full  Family Communication: none at bedside Disposition:   Status is: Inpatient  Remains  inpatient appropriate because:Inpatient level of care appropriate due to severity of illness   Dispo: The patient is from: Home              Anticipated d/c is to: Home              Anticipated d/c date is: 2  days              Patient currently is not medically stable to d/c.   Consultants:   nephrology  Procedures:  LE [place] Summary:  RIGHT:  - Findings appear essentially unchanged compared to previous examination.  - There is no evidence of deep vein thrombosis in the lower extremity.  However, portions of this examination were limited- see technologist  comments above.    LEFT:  - Findings appear essentially unchanged compared to previous examination.  - There is no evidence of deep vein thrombosis in the lower extremity.  However, portions of this examination were limited- see technologist  comments above.   Antimicrobials:  Anti-infectives (From admission, onward)   Start     Dose/Rate Route Frequency Ordered Stop   08/23/19 1300  erythromycin 250 mg in sodium chloride 0.9 % 100 mL IVPB     Discontinue     250 mg 100 mL/hr over 60 Minutes Intravenous Every 8 hours 08/23/19 1152       Subjective: No new complaints today C/o vomiting this AM  Objective: Vitals:   08/23/19 1255 08/23/19 2021 08/24/19 0432 08/24/19 1242  BP: 115/64 (!) 145/85 132/64 124/75  Pulse: 89 97 (!) 101 94  Resp: 16 20 20 19   Temp: 98.3 F (36.8 C) 97.6 F (36.4 C) 97.7 F (36.5 C) 97.6 F (36.4 C)  TempSrc: Oral Oral Oral Oral  SpO2: 95% 99% 100% 98%  Weight:      Height:        Intake/Output Summary (Last 24 hours) at 08/24/2019 1556 Last data filed at 08/24/2019 1216 Gross per 24 hour  Intake 230.69 ml  Output 800 ml  Net -569.31 ml   Filed Weights   08/19/19 2300 08/22/19 0500 08/23/19 0553  Weight: (!) 147.6 kg (!) 155.5 kg (!) 154.8 kg    Examination:  General: No acute distress. Cardiovascular: Heart sounds show Sanjeev Main regular rate, and rhythm.  Lungs: Clear to auscultation bilaterally Abdomen: Soft, mildly diffusely tender Neurological: Alert and oriented 3. Moves all extremities 4 with equal strength. Cranial nerves II through XII grossly intact. Skin: Warm and dry. No  rashes or lesions. Extremities: bilateral LE edema  Data Reviewed: I have personally reviewed following labs and imaging studies  CBC: Recent Labs  Lab 08/19/19 1619 08/19/19 1619 08/20/19 0853 08/21/19 0523 08/23/19 0515 08/23/19 1004 08/24/19 0104  WBC 11.7*  --  11.8* 10.0 11.2*  --  11.0*  NEUTROABS 8.9*  --   --   --  8.5*  --  8.3*  HGB 11.1*   < > 9.1* 11.3* 7.3* 7.3* 8.1*  HCT 35.4*   < > 28.9* 36.1 23.8* 23.8* 25.9*  MCV 84.9  --  87.0 86.8 88.8  --  88.1  PLT 436*  --  348 268 275  --  288   < > = values in this interval not displayed.    Basic Metabolic Panel: Recent Labs  Lab 08/19/19 1619 08/19/19 1901 08/20/19 0853 08/21/19 0523 08/22/19 0911 08/23/19 0515 08/24/19 0104  NA   < >  --  141 142 134* 133* 135  K   < >  --  3.3* 4.3 3.6 3.7 3.3*  CL   < >  --  108 113* 106 105 106  CO2   < >  --  23 19* 21* 19* 22  GLUCOSE   < >  --  204* 137* 147* 237* 178*  BUN   < >  --  23* 24* 23* 27* 26*  CREATININE   < >  --  1.52* 1.99* 2.00* 2.05* 1.97*  CALCIUM   < >  --  8.2* 7.9* 8.1* 8.0* 8.5*  MG  --  1.2*  --  1.6* 1.8 1.7 1.5*  PHOS  --   --   --   --   --  4.1 3.5   < > = values in this interval not displayed.    GFR: Estimated Creatinine Clearance: 49.7 mL/min ([name]) (by C-G formula based on SCr of 1.97 mg/dL (H)).  Liver Function Tests: Recent Labs  Lab 08/19/19 1619 08/22/19 0911 08/23/19 0515 08/24/19 0104  AST 21 11* 9* 9*  ALT 20 14 11 12   ALKPHOS 106 86 88 91  BILITOT 0.8 0.6 0.2* 0.5  PROT 8.3* 6.6 6.0* 6.9  ALBUMIN 3.5 2.9* 2.5* 2.8*    CBG: Recent Labs  Lab 08/23/19 1156 08/23/19 1630 08/23/19 2022 08/24/19 0734 08/24/19 1130  GLUCAP 166* 230* 312* 170* 151*     Recent Results (from the past 240 hour(s))  SARS Coronavirus 2 by RT PCR (hospital order, performed in [facility] hospital lab) Nasopharyngeal Nasopharyngeal Swab     Status: None   Collection Time: 08/19/19  7:23 PM   Specimen: Nasopharyngeal Swab  Result  Value Ref Range Status   SARS Coronavirus 2 NEGATIVE NEGATIVE Final    Comment: (NOTE) SARS-CoV-2 target nucleic acids are NOT DETECTED.  The SARS-CoV-2 RNA is generally detectable in upper and lower respiratory specimens during the acute phase of infection. The lowest concentration of SARS-CoV-2 viral copies this assay can detect is 250 copies / mL. Bernita Beckstrom negative result does not preclude SARS-CoV-2 infection and should not be used as the sole basis for treatment or other patient management decisions.  Kailon Treese negative result may occur with improper specimen collection / handling, submission of specimen other than nasopharyngeal swab, presence of viral mutation(s) within the areas targeted by this assay, and inadequate number of viral copies (<250 copies / mL). Lenka Zhao negative result must be combined with clinical observations, patient history, and epidemiological information.  Fact Sheet for Patients:   StrictlyIdeas.no  Fact Sheet for Healthcare Providers: BankingDealers.co.za  This test is not yet approved or  cleared by the [place] FDA and has been authorized for detection and/or diagnosis of SARS-CoV-2 by FDA under an Emergency Use Authorization (EUA).  This EUA will remain in effect (meaning this test can be used) for the duration of the COVID-19 declaration under Section 564(b)(1) of the Act, 21 U.S.C. section 360bbb-3(b)(1), unless the authorization is terminated or revoked sooner.  Performed at [facility], [place] [address], [place], [place] [address]          Radiology Studies: CT ABDOMEN PELVIS WO CONTRAST  Result Date: 08/23/2019 CLINICAL DATA:  Acute epigastric abdominal pain. EXAM: CT ABDOMEN AND PELVIS WITHOUT CONTRAST TECHNIQUE: Multidetector CT imaging of the abdomen and pelvis was performed following the standard protocol without IV contrast. COMPARISON:  Aug 03, 2019.  Jul 22, 2019. FINDINGS: Lower  chest: No acute abnormality. Hepatobiliary: No focal liver abnormality is seen. Status post cholecystectomy. No biliary dilatation. Pancreas: Unremarkable.  No pancreatic ductal dilatation or surrounding inflammatory changes. Spleen: Normal in size without focal abnormality. Adrenals/Urinary Tract: Adrenal glands are unremarkable. Kidneys are normal, without renal calculi, focal lesion, or hydronephrosis. Bladder is unremarkable. Stomach/Bowel: Stomach is within normal limits. Appendix appears normal. No evidence of bowel wall thickening, distention, or inflammatory changes. Vascular/Lymphatic: No significant vascular findings are present. No enlarged abdominal or pelvic lymph nodes. Reproductive: No adnexal abnormality is noted. Stable uterine fibroid is noted. Other: No abdominal wall hernia or abnormality. No abdominopelvic ascites. Musculoskeletal: No acute or significant osseous findings. IMPRESSION: 1. Stable uterine fibroid. 2. No other abnormality seen in the abdomen or pelvis. Electronically Signed   By: Marijo Conception M.D.   On: 08/23/2019 16:11   VAS [place] LOWER EXTREMITY VENOUS (DVT)  Result Date: 08/23/2019  Lower Venous DVTStudy Indications: Swelling.  Limitations: Body habitus and pain with compression. Comparison Study: Prior LLE venous duplex from 05/30/15 and RLE venous duplex                   from 07/19/14, are available for comparison Performing Technologist: Sharion Dove RVS  Examination Guidelines: Lot [place] complete evaluation includes B-mode imaging, spectral Doppler, color Doppler, and power Doppler as needed of all accessible portions of each vessel. Bilateral testing is considered an integral part of Lucianne Smestad complete examination. Limited examinations for reoccurring indications may be performed as noted. The reflux portion of the exam is performed with the patient in reverse Trendelenburg.  +---------+---------------+---------+-----------+----------+-------------------+ RIGHT     CompressibilityPhasicitySpontaneityPropertiesThrombus Aging      +---------+---------------+---------+-----------+----------+-------------------+ CFV      Full           Yes      Yes                                      +---------+---------------+---------+-----------+----------+-------------------+ SFJ      Full                                                             +---------+---------------+---------+-----------+----------+-------------------+ FV Prox  Full                                                             +---------+---------------+---------+-----------+----------+-------------------+ FV Mid   Full                                                             +---------+---------------+---------+-----------+----------+-------------------+ FV Distal               Yes      Yes                  patent by color and  Doppler             +---------+---------------+---------+-----------+----------+-------------------+ PFV      Full                                                             +---------+---------------+---------+-----------+----------+-------------------+ POP      Full           Yes      Yes                                      +---------+---------------+---------+-----------+----------+-------------------+ PTV      Full           Yes      Yes                                      +---------+---------------+---------+-----------+----------+-------------------+ PERO                                                  Not visualized      +---------+---------------+---------+-----------+----------+-------------------+   +---------+---------------+---------+-----------+----------+-------------------+ LEFT     CompressibilityPhasicitySpontaneityPropertiesThrombus Aging      +---------+---------------+---------+-----------+----------+-------------------+ CFV       Full           Yes      Yes                                      +---------+---------------+---------+-----------+----------+-------------------+ SFJ      Full                                                             +---------+---------------+---------+-----------+----------+-------------------+ FV Prox  Full                                                             +---------+---------------+---------+-----------+----------+-------------------+ FV Mid   Full                                                             +---------+---------------+---------+-----------+----------+-------------------+ FV Distal               Yes      Yes                  patent by color and  Doppler             +---------+---------------+---------+-----------+----------+-------------------+ POP      Full           Yes      Yes                                      +---------+---------------+---------+-----------+----------+-------------------+ PTV      Full                                                             +---------+---------------+---------+-----------+----------+-------------------+ PERO                                                  Not visualized      +---------+---------------+---------+-----------+----------+-------------------+     Summary: RIGHT: - Findings appear essentially unchanged compared to previous examination. - There is no evidence of deep vein thrombosis in the lower extremity. However, portions of this examination were limited- see technologist comments above.  LEFT: - Findings appear essentially unchanged compared to previous examination. - There is no evidence of deep vein thrombosis in the lower extremity. However, portions of this examination were limited- see technologist comments above.  - Kalven Ganim cystic structure is found in the popliteal fossa.  *See table(s) above for measurements  and observations. Electronically signed by Monica Martinez MD on 08/23/2019 at 3:20:02 PM.    Final         Scheduled Meds: . atorvastatin  10 mg Oral Daily  . DULoxetine  40 mg Oral Daily  . insulin aspart  0-20 Units Subcutaneous TID PC & HS  . insulin aspart  4 Units Subcutaneous TID WC  . insulin glargine  30 Units Subcutaneous QHS  . isosorbide mononitrate  30 mg Oral Daily  . loratadine  10 mg Oral Daily  . melatonin  6 mg Oral QHS  . metoprolol succinate  12.5 mg Oral Daily  . pantoprazole  40 mg Oral BID  . torsemide  40 mg Oral Daily   Continuous Infusions: . sodium chloride 250 mL (08/23/19 2359)  . erythromycin 250 mg (08/24/19 1345)     LOS: 4 days    Time spent: over 30 min    Fayrene Helper, MD [facility]   To contact the attending provider between 7A-7P or the covering provider during after hours 7P-7A, please log into the web site [email or web address] and access using universal [place] password for that web site. If you do not have the password, please call the hospital operator.  08/24/2019, 3:56 PM

## 2019-08-24 NOTE — Progress Notes (Signed)
IV team attempted Midline placement. Unsuccessful. Patient request for PICC line. Peripheral IV is working - frequently alarms occluded-Causing delay in antibiotic treatment.

## 2019-08-24 NOTE — Consult Note (Signed)
Subjective:   HPI  The patient is a 54 year old female with a history of diabetes mellitus, chronic systolic congestive heart failure, hypertension, dyslipidemia, GERD, and gastroparesis.  She was admitted to the hospital after presenting to the emergency room with acute onset of intractable vomiting and epigastric abdominal pain.  She denied hematemesis hematochezia or melena.  We were asked to see her in regards to a drop in hemoglobin and hematocrit over the last few days from 11.33 days ago to 7.3 yesterday but up to 8.1 today.  The patient denies that she had any bleeding from the GI tract during these last few days.  In reviewing her records she had a colonoscopy around May 24 showing 2 small tubular adenomas which were removed.  She also had an EGD around that time and there was a duodenal polyp noted which was biopsied and turned out to be a well-differentiated carcinoid tumor.  This was subsequently removed a few days later by EGD and polypectomy and pathology came back low-grade neuroendocrine tumor (carcinoid) with lymphovascular invasion.  There was a hemostatic Endo Clip placed.  There was a small 4 mm duodenal ulcer noted also.  She was sent home on Protonix and also put back on Eliquis.  Review of Systems No chest pain or shortness of breath  Past Medical History:  Diagnosis Date  . Acute back pain with sciatica, left   . Acute back pain with sciatica, right   . AKI (acute kidney injury) ([place])   . Anemia, unspecified   . Chronic pain   . Chronic systolic CHF (congestive heart failure) ([place])   . Diabetes mellitus   . Esophageal reflux   . Fibromyalgia   . Gastric ulcer   . Gastroparesis   . Gout   . Hyperlipidemia   . Hypertension   . Lumbosacral stenosis   . NICM (nonischemic cardiomyopathy) ([place])   . Obesity   . PAF (paroxysmal atrial fibrillation) ([place])   . Stroke ([place]) 02/2011  . Vitamin B12 deficiency anemia    Past Surgical History:  Procedure Laterality Date  .  BIOPSY  07/27/2019   Procedure: BIOPSY;  Surgeon: Clarene Essex, MD;  Location: [facility];  Service: Endoscopy;;  . BIOPSY  07/30/2019   Procedure: BIOPSY;  Surgeon: Otis Brace, MD;  Location: [facility];  Service: Gastroenterology;;  . CATARACT EXTRACTION  01/2014  . CHOLECYSTECTOMY    . COLONOSCOPY WITH PROPOFOL N/A 07/30/2019   Procedure: COLONOSCOPY WITH PROPOFOL;  Surgeon: Otis Brace, MD;  Location: [facility];  Service: Gastroenterology;  Laterality: N/A;  . ESOPHAGOGASTRODUODENOSCOPY N/A 07/27/2019   Procedure: ESOPHAGOGASTRODUODENOSCOPY (EGD);  Surgeon: Clarene Essex, MD;  Location: [facility];  Service: Endoscopy;  Laterality: N/A;  . ESOPHAGOGASTRODUODENOSCOPY (EGD) WITH PROPOFOL N/A 08/02/2019   Procedure: ESOPHAGOGASTRODUODENOSCOPY (EGD) WITH PROPOFOL;  Surgeon: Otis Brace, MD;  Location: [facility];  Service: Gastroenterology;  Laterality: N/A;  . HEMOSTASIS CLIP PLACEMENT  08/02/2019   Procedure: HEMOSTASIS CLIP PLACEMENT;  Surgeon: Otis Brace, MD;  Location: [facility];  Service: Gastroenterology;;  . POLYPECTOMY  07/30/2019   Procedure: POLYPECTOMY;  Surgeon: Otis Brace, MD;  Location: [facility];  Service: Gastroenterology;;  . POLYPECTOMY  08/02/2019   Procedure: POLYPECTOMY;  Surgeon: Otis Brace, MD;  Location: [facility];  Service: Gastroenterology;;   Social History   Socioeconomic History  . Marital status: Single    Spouse name: Not on file  . Number of children: Not on file  . Years of education: Not on file  . Highest  education level: Not on file  Occupational History  . Not on file  Tobacco Use  . Smoking status: Never Smoker  . Smokeless tobacco: Never Used  Vaping Use  . Vaping Use: Never used  Substance and Sexual Activity  . Alcohol use: No  . Drug use: No  . Sexual activity: Not Currently    Birth control/protection: None  Other Topics Concern  . Not on file  Social History Narrative   ** Merged  History Encounter **       Social Determinants of Health   Financial Resource Strain:   . Difficulty of Paying Living Expenses:   Food Insecurity:   . Worried About Charity fundraiser in the Last Year:   . Arboriculturist in the Last Year:   Transportation Needs:   . Film/video editor (Medical):   [name] Lack of Transportation (Non-Medical):   Physical Activity:   . Days of Exercise per Week:   . Minutes of Exercise per Session:   Stress:   . Feeling of Stress :   Social Connections:   . Frequency of Communication with Friends and Family:   . Frequency of Social Gatherings with Friends and Family:   . Attends Religious Services:   . Active Member of Clubs or Organizations:   . Attends Archivist Meetings:   [name] Marital Status:   Intimate Partner Violence:   . Fear of Current or Ex-Partner:   . Emotionally Abused:   [name] Physically Abused:   . Sexually Abused:    family history includes Congestive Heart Failure (age of onset: 73) in her sister; Diabetes in her brother, father, mother, and sister; Heart disease in her father.  Current Facility-Administered Medications:  .  0.9 %  sodium chloride infusion, , Intravenous, PRN, Elodia [name]., MD, Last Rate: 10 mL/hr at 08/23/19 2359, 250 mL at 08/23/19 2359 .  albuterol (PROVENTIL) (2.5 MG/3ML) 0.083% nebulizer solution 2.5 mg, 2.5 mg, Nebulization, Q6H PRN, Mansy, Jan A, MD .  ALPRAZolam Duanne Moron) tablet 1 mg, 1 mg, Oral, BID PRN, Mansy, Jan A, MD, 1 mg at 08/21/19 2135 .  atorvastatin (LIPITOR) tablet 10 mg, 10 mg, Oral, Daily, Mansy, Jan A, MD, 10 mg at 08/24/19 0901 .  dicyclomine (BENTYL) tablet 20 mg, 20 mg, Oral, TID PRN, Mansy, Jan A, MD .  diphenhydrAMINE (BENADRYL) capsule 25 mg, 25 mg, Oral, QHS PRN, Lang Snow, FNP, 25 mg at 08/22/19 2145 .  DULoxetine (CYMBALTA) DR capsule 40 mg, 40 mg, Oral, Daily, Mansy, Jan A, MD, 40 mg at 08/24/19 0917 .  erythromycin 250 mg in sodium chloride 0.9 % 100 mL IVPB,  250 mg, Intravenous, Q8H, Elodia [name]., MD, Last Rate: 100 mL/hr at 08/24/19 1345, 250 mg at 08/24/19 1345 .  fluticasone (FLONASE) 50 MCG/ACT nasal spray 2 spray, 2 spray, Each Nare, Daily PRN, Mansy, Jan A, MD .  HYDROmorphone (DILAUDID) injection 1 mg, 1 mg, Intravenous, Q6H PRN, Elodia [name]., MD .  insulin aspart (novoLOG) injection 0-20 Units, 0-20 Units, Subcutaneous, TID PC & HS, Mansy, Arvella Merles, MD, 4 Units at 08/24/19 1351 .  insulin aspart (novoLOG) injection 4 Units, 4 Units, Subcutaneous, TID WC, Elodia [name]., MD, 4 Units at 08/24/19 1352 .  insulin glargine (LANTUS) injection 30 Units, 30 Units, Subcutaneous, QHS, Matcha, Anupama, MD, 30 Units at 08/23/19 2222 .  isosorbide mononitrate (IMDUR) 24 hr tablet 30 mg, 30 mg, Oral, Daily, Roney Jaffe,  MD, 30 mg at 08/24/19 0902 .  loperamide (IMODIUM) capsule 2 mg, 2 mg, Oral, QID PRN, Mansy, Jan A, MD .  loratadine (CLARITIN) tablet 10 mg, 10 mg, Oral, Daily, Mansy, Jan A, MD, 10 mg at 08/24/19 0901 .  magnesium hydroxide (MILK OF MAGNESIA) suspension 30 mL, 30 mL, Oral, Daily PRN, Mansy, Jan A, MD .  melatonin tablet 6 mg, 6 mg, Oral, QHS, Mansy, Jan A, MD, 6 mg at 08/23/19 2139 .  metoprolol succinate (TOPROL-XL) 24 hr tablet 12.5 mg, 12.5 mg, Oral, Daily, Roney Jaffe, MD, 12.5 mg at 08/24/19 0902 .  nitroGLYCERIN (NITROSTAT) SL tablet 0.4 mg, 0.4 mg, Sublingual, Q5 min PRN, Mansy, Jan A, MD .  ondansetron [facility]) tablet 4 mg, 4 mg, Oral, Q6H PRN, 4 mg at 08/23/19 0856 **OR** ondansetron (ZOFRAN) injection 4 mg, 4 mg, Intravenous, Q6H PRN, Mansy, Jan A, MD, 4 mg at 08/21/19 1828 .  ondansetron (ZOFRAN-ODT) disintegrating tablet 4 mg, 4 mg, Oral, Q8H PRN, Mansy, Jan A, MD, 4 mg at 08/22/19 1044 .  pantoprazole (PROTONIX) EC tablet 40 mg, 40 mg, Oral, BID, Eudelia Bunch, RPH, 40 mg at 08/24/19 0901 .  torsemide (DEMADEX) tablet 40 mg, 40 mg, Oral, Daily, Roney Jaffe, MD, 40 mg at 08/24/19 1142 .   traZODone (DESYREL) tablet 25 mg, 25 mg, Oral, QHS PRN, Mansy, Jan A, MD, 25 mg at 08/22/19 2145 Allergies  Allergen Reactions  . Contrast Media [Iodinated Diagnostic Agents] Anaphylaxis    Cardiac arrest  . Diazepam [name]  . Isovue [Iopamidol] Anaphylaxis    Patient had seizure like activity and then code post 100 cc of isovue 300  . Lisinopril Anaphylaxis    Tongue and mouth swelling  . Penicillins Palpitations    Has patient had a PCN reaction causing immediate rash, facial/tongue/throat swelling, SOB or lightheadedness with hypotension: Yes, heart races Has patient had a PCN reaction causing severe rash involving mucus membranes or skin necrosis: No Has patient had a PCN reaction that required hospitalization: Yes  Has patient had a PCN reaction occurring within the last 10 years: No   . Acetaminophen Nausea Only and Other (See Comments)    Irritates stomach ulcer  Abdominal pain  . Tolmetin Nausea Only and Other (See Comments)    ULCER  . Aspirin Other (See Comments)    Irritates stomach ulcer   . Metoclopramide Other (See Comments)    Tardive dyskinesia  . Nsaids Other (See Comments)    ULCER  . Tramadol Nausea And Vomiting     Objective:     BP 124/75 (BP Location: Right Arm)   Pulse 94   Temp 97.6 F (36.4 C) (Oral)   Resp 19   Ht 5\' 6"  (1.676 m)   Wt (!) 154.8 kg   LMP 10/10/2012   SpO2 98%   BMI 55.08 kg/m   She is in no distress  Heart regular rhythm  Lungs clear  Abdomen soft and nontender  Laboratory No components found for: D1    Assessment:     Acute drop in hemoglobin and hematocrit as noted.  I suspect that she lost some blood from the duodenal polypectomy site which was diagnosed as a carcinoid.  She has not noticed any bleeding but stool was heme positive.      Plan:     At this time I would recommend conservative management with PPI therapy and holding Eliquis for a few more days.  If hemoglobin remains stable I  do not  think we need to do anything specifically however, if hemoglobin drops or there are overt signs of active bleeding we will need to repeat endoscopy for therapeutic intervention.  We will follow. Lab Results  Component Value Date   HGB 8.1 (L) 08/24/2019   HGB 7.3 (L) 08/23/2019   HGB 7.3 (L) 08/23/2019   HGB 10.3 (L) 04/06/2019   HGB 10.0 (L) 12/13/2018   HCT 25.9 (L) 08/24/2019   HCT 23.8 (L) 08/23/2019   HCT 23.8 (L) 08/23/2019   HCT 31.8 (L) 04/06/2019   HCT 31.9 (L) 12/13/2018   ALKPHOS 91 08/24/2019   ALKPHOS 88 08/23/2019   ALKPHOS 86 08/22/2019   AST 9 (L) 08/24/2019   AST 9 (L) 08/23/2019   AST 11 (L) 08/22/2019   ALT 12 08/24/2019   ALT 11 08/23/2019   ALT 14 08/22/2019

## 2019-08-24 NOTE — Plan of Care (Signed)
  Problem: Clinical Measurements: Goal: Will remain free from infection Outcome: Progressing Goal: Diagnostic test results will improve Outcome: Progressing Goal: Cardiovascular complication will be avoided Outcome: Progressing   Problem: Nutrition: Goal: Adequate nutrition will be maintained Outcome: Progressing   Problem: Skin Integrity: Goal: Risk for impaired skin integrity will decrease Outcome: Progressing

## 2019-08-25 LAB — COMPREHENSIVE METABOLIC PANEL
ALT: 13 U/L (ref 0–44)
AST: 15 U/L (ref 15–41)
Albumin: 2.7 g/dL — ABNORMAL LOW (ref 3.5–5.0)
Alkaline Phosphatase: 87 U/L (ref 38–126)
Anion gap: 8 (ref 5–15)
BUN: 24 mg/dL — ABNORMAL HIGH (ref 6–20)
CO2: 24 mmol/L (ref 22–32)
Calcium: 8.2 mg/dL — ABNORMAL LOW (ref 8.9–10.3)
Chloride: 103 mmol/L (ref 98–111)
Creatinine, Ser: 1.57 mg/dL — ABNORMAL HIGH (ref 0.44–1.00)
GFR calc Af Amer: 43 mL/min — ABNORMAL LOW (ref >60)
GFR calc non Af Amer: 37 mL/min — ABNORMAL LOW (ref >60)
Glucose, Bld: 191 mg/dL — ABNORMAL HIGH (ref 70–99)
Potassium: 3.2 mmol/L — ABNORMAL LOW (ref 3.5–5.1)
Sodium: 135 mmol/L (ref 135–145)
Total Bilirubin: 0.6 mg/dL (ref 0.3–1.2)
Total Protein: 6.7 g/dL (ref 6.5–8.1)

## 2019-08-25 LAB — CBC WITH DIFFERENTIAL/PLATELET
Abs Immature Granulocytes: 0.04 10*3/uL (ref 0.00–0.07)
Basophils Absolute: 0 10*3/uL (ref 0.0–0.1)
Basophils Relative: 0 %
Eosinophils Absolute: 0.3 10*3/uL (ref 0.0–0.5)
Eosinophils Relative: 3 %
HCT: 24.9 % — ABNORMAL LOW (ref 36.0–46.0)
Hemoglobin: 7.7 g/dL — ABNORMAL LOW (ref 12.0–15.0)
Immature Granulocytes: 0 %
Lymphocytes Relative: 16 %
Lymphs Abs: 1.6 10*3/uL (ref 0.7–4.0)
MCH: 26.8 pg (ref 26.0–34.0)
MCHC: 30.9 g/dL (ref 30.0–36.0)
MCV: 86.8 fL (ref 80.0–100.0)
Monocytes Absolute: 0.7 10*3/uL (ref 0.1–1.0)
Monocytes Relative: 7 %
Neutro Abs: 7.5 10*3/uL (ref 1.7–7.7)
Neutrophils Relative %: 74 %
Platelets: 372 10*3/uL (ref 150–400)
RBC: 2.87 MIL/uL — ABNORMAL LOW (ref 3.87–5.11)
RDW: 14.5 % (ref 11.5–15.5)
WBC: 10.1 10*3/uL (ref 4.0–10.5)
nRBC: 0 % (ref 0.0–0.2)

## 2019-08-25 LAB — GLUCOSE, CAPILLARY
Glucose-Capillary: 166 mg/dL — ABNORMAL HIGH (ref 70–99)
Glucose-Capillary: 115 mg/dL — ABNORMAL HIGH (ref 70–99)
Glucose-Capillary: 253 mg/dL — ABNORMAL HIGH (ref 70–99)
Glucose-Capillary: 231 mg/dL — ABNORMAL HIGH (ref 70–99)

## 2019-08-25 LAB — MAGNESIUM: Magnesium: 1.3 mg/dL — ABNORMAL LOW (ref 1.7–2.4)

## 2019-08-25 LAB — PHOSPHORUS: Phosphorus: 2.8 mg/dL (ref 2.5–4.6)

## 2019-08-25 MED ORDER — FERROUS SULFATE 325 (65 FE) MG PO TABS
325.0000 mg | ORAL_TABLET | Freq: Every day | ORAL | Status: DC
  Administered 2019-08-26 – 2019-08-31 (×6): 325 mg via ORAL
  Filled 2019-08-25 (×6): qty 1

## 2019-08-25 MED ORDER — MAGNESIUM SULFATE 4 GM/100ML IV SOLN
4.0000 g | Freq: Once | INTRAVENOUS | Status: AC
  Administered 2019-08-25: 4 g via INTRAVENOUS
  Filled 2019-08-25: qty 100

## 2019-08-25 MED ORDER — HYDROMORPHONE HCL 1 MG/ML IJ SOLN
0.5000 mg | Freq: Three times a day (TID) | INTRAMUSCULAR | Status: DC | PRN
  Administered 2019-08-25 – 2019-08-27 (×5): 0.5 mg via INTRAVENOUS
  Filled 2019-08-25 (×6): qty 0.5

## 2019-08-25 MED ORDER — POTASSIUM CHLORIDE CRYS ER 20 MEQ PO TBCR
40.0000 meq | EXTENDED_RELEASE_TABLET | Freq: Once | ORAL | Status: AC
  Administered 2019-08-25: 40 meq via ORAL
  Filled 2019-08-25: qty 2

## 2019-08-25 NOTE — Progress Notes (Signed)
Patient has had no complaints of nausea or vomiting since 0700 this am.

## 2019-08-25 NOTE — Progress Notes (Signed)
PROGRESS NOTE    Deborah [name]  OVZ:[id number] DOB: 1965/05/09 DOA: 08/19/2019 PCP: [name], DO   Chief Complaint  Patient presents with  . [name]  . Chest Pain  . Emesis    Brief Narrative:  [name] y.o.African-American femalewith Deborah [name] known history of type II obese mellitus, chronic systolic CHF, hypertension, dyslipidemia and GERD as well as gastroparesis, presented to the emergency room with acute onset of intractable vomiting with associated epigastric and lower abdominal pain. Denies hematemesis or malena. Also had chest pain felt as pressure and graded 5/10 in severity with radiation to her left arm and associated mild dyspnea without complications.   Upon presentation to the emergency room, blood pressure was 153/105 with heart rate of 115 and RR 24 and temperature 98. Labs revealed hypokalemia of 3.1 glucose of 218 with Deborah [name] BUN of 30 and creatinine 1.61 compared to 30/1.28 08/07/2019. Serum lipase was 34 and BNP 60.8. High-sensitivity troponin I was 26 and later 21. CBC showed minimal leukocytosis of 11.7 with neutrophilia and anemia better than previous levels. Chest x-ray showed cardiomegaly without acute cardiopulmonary disease.EKG showed sinus tachycardia with Deborah [name] rate of 116 with left axis deviation poor R wave progression.  The patient was given Dilaudid 1 mg IV twice as well as 4 mg of IV Zofran and 1 L bolus of IV normal saline. She was still having recurrent epigastric pain and nausea.   Assessment & Plan:   Active Problems:   Diabetic gastroparesis (Deborah [name])  1. Diabetic gastroparesis with intractable epigastric pain as well as recurrent nausea and vomiting. - Continue hydration with IV normal saline. - Continue as needed IV Zofran and Phenergan. Apparently she has not tolerated Reglan in the past. It apparently gave her extraparametal symptoms and tachycardia.   - trial of erythromycin  - CT without acute findings -  oxycodone, on IV dilaudid prn, will need to taper this and try to limit opiates -Continue Protonix. -On clear liquids to be advanced as tolerated. - CT from 5/28 with 15 mm metallic density in region of duodenal bulb - consider repeat imaging if persistent pain - she notes feeling better today.  No reports of N/V this AM.  Nursing notes note reports as well.  Her reports sometimes don't line up with nursing.  hopefully can d/c tomorrow  # Anemia  Iron Def Anemia: Hb stable at 7.7 today.  Positive FOBT, but no obvious blood in stool per discussion with RN.  Continue to trend H/H.  Vitals stable. Labs suggestive of iron def anemia GI c/s, recommending holding eliquis, PPI, appreciate assistance - resume eliquis in AM, no endoscopic intervention planned  2. Hypokalemia/hypomagnesemia. - replace, follow  3. Acute kidney injury. -creatinine 1.57 today, improved, stable, baseline ~1.3-1.8 - Per holding further IVF and resuming torsemide with LE edema in setting of IVF - follow UP/C (1.69) - UA with RBC's, WBC's and proteinuria - follow outpatient - per renal, ? New baseline  4. Chest pain, likely atypical. - troponin flat, low  - suspect this is related to epigastric pain - consider additional w/u if worsening or concerning symptoms  5.Type 2 diabetes mellitus. -Continue lantus 30 units daily (lower dose) -Consulted diabetes coordinator.  6. Hypertension. -On Imdur, Toprol-XL which is supposed to be her home medications.  Hydral on hold.  Torsemide resumed.  7. Dyslipidemia. -We will continue statin therapy.  8. Anxiety. -Continue Xanax.  9.COPD without exacerbation. -Continue her nebulizer therapy.  # LE edema: torsemide resumed per  renal, follow LE [place] (low suspicion as on anticoagulation) - negative for DVT  # Atrial Fibrillation: continue eliquis - on hold   DVT prophylaxis: eliquis on hold -> SCD's Code Status: full  Family Communication: none at  bedside Disposition:   Status is: Inpatient  Remains inpatient appropriate because:Inpatient level of care appropriate due to severity of illness   Dispo: The patient is from: Home              Anticipated d/c is to: Home              Anticipated d/c date is: 2 days              Patient currently is not medically stable to d/c.   Consultants:   nephrology  Procedures:  LE [place] Summary:  RIGHT:  - Findings appear essentially unchanged compared to previous examination.  - There is no evidence of deep vein thrombosis in the lower extremity.  However, portions of this examination were limited- see technologist  comments above.    LEFT:  - Findings appear essentially unchanged compared to previous examination.  - There is no evidence of deep vein thrombosis in the lower extremity.  However, portions of this examination were limited- see technologist  comments above.   Antimicrobials:  Anti-infectives (From admission, onward)   Start     Dose/Rate Route Frequency Ordered Stop   08/23/19 1300  erythromycin 250 mg in sodium chloride 0.9 % 100 mL IVPB     Discontinue     250 mg 100 mL/hr over 60 Minutes Intravenous Every 8 hours 08/23/19 1152       Subjective: No new complaints  Objective: Vitals:   08/24/19 0432 08/24/19 1242 08/24/19 2016 08/25/19 0544  BP: 132/64 124/75 (!) 146/82 138/75  Pulse: (!) 101 94 (!) 101 93  Resp: 20 19 16 18   Temp: 97.7 F (36.5 C) 97.6 F (36.4 C) 97.6 F (36.4 C) (!) 97.4 F (36.3 C)  TempSrc: Oral Oral Oral Oral  SpO2: 100% 98% 99% 98%  Weight:      Height:        Intake/Output Summary (Last 24 hours) at 08/25/2019 1536 Last data filed at 08/25/2019 0509 Gross per 24 hour  Intake 498.98 ml  Output --  Net 498.98 ml   Filed Weights   08/19/19 2300 08/22/19 0500 08/23/19 0553  Weight: (!) 147.6 kg (!) 155.5 kg (!) 154.8 kg    Examination:  General: No acute distress. Cardiovascular: Heart sounds show Deborah [name] regular rate, and  rhythm. Lungs: Clear to auscultation bilaterally Abdomen: Soft, mild diffuse tenderness Neurological: Alert and oriented 3. Moves all extremities 4. Cranial nerves II through XII grossly intact. Skin: Warm and dry. No rashes or lesions. Extremities: bilateral LE edema  Data Reviewed: I have personally reviewed following labs and imaging studies  CBC: Recent Labs  Lab 08/19/19 1619 08/19/19 1619 08/20/19 0853 08/20/19 0853 08/21/19 0523 08/23/19 0515 08/23/19 1004 08/24/19 0104 08/25/19 0559  WBC 11.7*   < > 11.8*  --  10.0 11.2*  --  11.0* 10.1  NEUTROABS 8.9*  --   --   --   --  8.5*  --  8.3* 7.5  HGB 11.1*   < > 9.1*   < > 11.3* 7.3* 7.3* 8.1* 7.7*  HCT 35.4*   < > 28.9*   < > 36.1 23.8* 23.8* 25.9* 24.9*  MCV 84.9   < > 87.0  --  86.8 88.8  --  88.1 86.8  PLT 436*   < > 348  --  268 275  --  288 372   < > = values in this interval not displayed.    Basic Metabolic Panel: Recent Labs  Lab 08/21/19 0523 08/22/19 0911 08/23/19 0515 08/24/19 0104 08/25/19 0559  NA 142 134* 133* 135 135  K 4.3 3.6 3.7 3.3* 3.2*  CL 113* 106 105 106 103  CO2 19* 21* 19* 22 24  GLUCOSE 137* 147* 237* 178* 191*  BUN 24* 23* 27* 26* 24*  CREATININE 1.99* 2.00* 2.05* 1.97* 1.57*  CALCIUM 7.9* 8.1* 8.0* 8.5* 8.2*  MG 1.6* 1.8 1.7 1.5* 1.3*  PHOS  --   --  4.1 3.5 2.8    GFR: Estimated Creatinine Clearance: 62.3 mL/min (Deborah [name]) (by C-G formula based on SCr of 1.57 mg/dL (H)).  Liver Function Tests: Recent Labs  Lab 08/19/19 1619 08/22/19 0911 08/23/19 0515 08/24/19 0104 08/25/19 0559  AST 21 11* 9* 9* 15  ALT 20 14 11 12 13   ALKPHOS 106 86 88 91 87  BILITOT 0.8 0.6 0.2* 0.5 0.6  PROT 8.3* 6.6 6.0* 6.9 6.7  ALBUMIN 3.5 2.9* 2.5* 2.8* 2.7*    CBG: Recent Labs  Lab 08/24/19 1130 08/24/19 1633 08/24/19 2051 08/25/19 0722 08/25/19 1115  GLUCAP 151* 323* 192* 166* 115*     Recent Results (from the past 240 hour(s))  SARS Coronavirus 2 by RT PCR (hospital order,  performed in [facility] hospital lab) Nasopharyngeal Nasopharyngeal Swab     Status: None   Collection Time: 08/19/19  7:23 PM   Specimen: Nasopharyngeal Swab  Result Value Ref Range Status   SARS Coronavirus 2 NEGATIVE NEGATIVE Final    Comment: (NOTE) SARS-CoV-2 target nucleic acids are NOT DETECTED.  The SARS-CoV-2 RNA is generally detectable in upper and lower respiratory specimens during the acute phase of infection. The lowest concentration of SARS-CoV-2 viral copies this assay can detect is 250 copies / mL. Deborah [name] negative result does not preclude SARS-CoV-2 infection and should not be used as the sole basis for treatment or other patient management decisions.  Deborah [name] negative result may occur with improper specimen collection / handling, submission of specimen other than nasopharyngeal swab, presence of viral mutation(s) within the areas targeted by this assay, and inadequate number of viral copies (<250 copies / mL). Deborah [name] negative result must be combined with clinical observations, patient history, and epidemiological information.  Fact Sheet for Patients:   StrictlyIdeas.no  Fact Sheet for Healthcare Providers: BankingDealers.co.za  This test is not yet approved or  cleared by the [place] FDA and has been authorized for detection and/or diagnosis of SARS-CoV-2 by FDA under an Emergency Use Authorization (EUA).  This EUA will remain in effect (meaning this test can be used) for the duration of the COVID-19 declaration under Section 564(b)(1) of the Act, 21 U.S.C. section 360bbb-3(b)(1), unless the authorization is terminated or revoked sooner.  Performed at [facility], [place] [address] [place], [place] [address]          Radiology Studies: No results found.      Scheduled Meds: . atorvastatin  10 mg Oral Daily  . DULoxetine  40 mg Oral Daily  . [START ON 08/26/2019] ferrous sulfate  325 mg Oral Q  breakfast  . insulin aspart  0-20 Units Subcutaneous TID PC & HS  . insulin aspart  4 Units Subcutaneous TID WC  . insulin glargine  30 Units Subcutaneous QHS  .  isosorbide mononitrate  30 mg Oral Daily  . loratadine  10 mg Oral Daily  . melatonin  6 mg Oral QHS  . metoprolol succinate  12.5 mg Oral Daily  . pantoprazole  40 mg Oral BID  . torsemide  40 mg Oral Daily   Continuous Infusions: . sodium chloride 250 mL (08/23/19 2359)  . erythromycin 250 mg (08/25/19 1253)     LOS: 5 days    Time spent: over 30 min    Deborah Helper, MD [facility]   To contact the attending provider between 7A-7P or the covering provider during after hours 7P-7A, please log into the web site [email or web address] and access using universal [place] password for that web site. If you do not have the password, please call the hospital operator.  08/25/2019, 3:36 PM

## 2019-08-25 NOTE — Progress Notes (Signed)
Subjective: Patient states she has had 10 liquid bowel movements today and complains of vomiting yesterday.  However as per her nurse, she has not had any episodes of vomiting, has been able to finish all her meals, and has not had multiple episodes of diarrhea.  Objective: Vital signs in last 24 hours: Temp:  [97.4 F (36.3 C)-97.6 F (36.4 C)] 97.4 F (36.3 C) (06/19 0544) Pulse Rate:  [93-101] 93 (06/19 0544) Resp:  [16-18] 18 (06/19 0544) BP: (138-146)/(75-82) 138/75 (06/19 0544) SpO2:  [98 %-99 %] 98 % (06/19 0544) Weight change:  Last BM Date: 08/24/19  PE: Obese, prominent pallor GENERAL: Sitting comfortably on bedside chair, not in distress ABDOMEN: Mild generalized tenderness, no rebound tenderness, no rigidity, no guarding, soft, normoactive bowel sounds EXTREMITIES: No edema  Lab Results: Results for orders placed or performed during the hospital encounter of 08/19/19 (from the past 48 hour(s))  Glucose, capillary     Status: Abnormal   Collection Time: 08/23/19  4:30 PM  Result Value Ref Range   Glucose-Capillary 230 (H) 70 - 99 mg/dL    Comment: Glucose reference range applies only to samples taken after fasting for at least 8 hours.  Occult blood card to lab, stool     Status: Abnormal   Collection Time: 08/23/19  5:36 PM  Result Value Ref Range   Fecal Occult Bld POSITIVE (A) NEGATIVE    Comment: Performed at [facility], [place] [address], [place], [place] [address]  Glucose, capillary     Status: Abnormal   Collection Time: 08/23/19  8:22 PM  Result Value Ref Range   Glucose-Capillary 312 (H) 70 - 99 mg/dL    Comment: Glucose reference range applies only to samples taken after fasting for at least 8 hours.  Vitamin B12     Status: None   Collection Time: 08/24/19  1:04 AM  Result Value Ref Range   Vitamin B-12 410 180 - 914 pg/mL    Comment: (NOTE) This assay is not validated for testing neonatal or myeloproliferative syndrome specimens  for Vitamin B12 levels. Performed at [facility], [place] [address] [place], [place] [address]   Ferritin     Status: None   Collection Time: 08/24/19  1:04 AM  Result Value Ref Range   Ferritin 98 11 - 307 ng/mL    Comment: Performed at [facility], [place] [address] [place], [place] [address]  Iron and TIBC     Status: Abnormal   Collection Time: 08/24/19  1:04 AM  Result Value Ref Range   Iron 12 (L) 28 - 170 ug/dL   TIBC 296 250 - 450 ug/dL   Saturation Ratios 4 (L) 10.4 - 31.8 %   UIBC 284 ug/dL    Comment: Performed at [facility], [place] [address] [place], [place] [address]  Folate     Status: None   Collection Time: 08/24/19  1:04 AM  Result Value Ref Range   Folate 6.0 >5.9 ng/mL    Comment: Performed at [facility], [place] [address] [place], [place] [address]  CBC with Differential/Platelet     Status: Abnormal   Collection Time: 08/24/19  1:04 AM  Result Value Ref Range   WBC 11.0 (H) 4.0 - 10.5 K/uL   RBC 2.94 (L) 3.87 - 5.11 MIL/uL   Hemoglobin 8.1 (L) 12.0 - 15.0 g/dL   HCT 25.9 (L) 36 - 46 %   MCV 88.1 80.0 -  100.0 fL   MCH 27.6 26.0 - 34.0 pg   MCHC 31.3 30.0 - 36.0 g/dL   RDW 14.6 11.5 - 15.5 %   Platelets 288 150 - 400 K/uL   nRBC 0.0 0.0 - 0.2 %   Neutrophils Relative % 75 %   Neutro Abs 8.3 (H) 1.7 - 7.7 K/uL   Lymphocytes Relative 14 %   Lymphs Abs 1.5 0.7 - 4.0 K/uL   Monocytes Relative 7 %   Monocytes Absolute 0.7 0 - 1 K/uL   Eosinophils Relative 3 %   Eosinophils Absolute 0.3 0 - 0 K/uL   Basophils Relative 0 %   Basophils Absolute 0.0 0 - 0 K/uL   Immature Granulocytes 1 %   Abs Immature Granulocytes 0.10 (H) 0.00 - 0.07 K/uL    Comment: Performed at [facility], [place] [address], [place], [place] [address]  Comprehensive metabolic panel     Status: Abnormal   Collection Time: 08/24/19  1:04 AM  Result Value Ref Range   Sodium 135 135 - 145 mmol/L    Potassium 3.3 (L) 3.5 - 5.1 mmol/L   Chloride 106 98 - 111 mmol/L   CO2 22 22 - 32 mmol/L   Glucose, Bld 178 (H) 70 - 99 mg/dL    Comment: Glucose reference range applies only to samples taken after fasting for at least 8 hours.   BUN 26 (H) 6 - 20 mg/dL   Creatinine, Ser 1.97 (H) 0.44 - 1.00 mg/dL   Calcium 8.5 (L) 8.9 - 10.3 mg/dL   Total Protein 6.9 6.5 - 8.1 g/dL   Albumin 2.8 (L) 3.5 - 5.0 g/dL   AST 9 (L) 15 - 41 U/L   ALT 12 0 - 44 U/L   Alkaline Phosphatase 91 38 - 126 U/L   Total Bilirubin 0.5 0.3 - 1.2 mg/dL   GFR calc non Af Amer 28 (L) >60 mL/min   GFR calc Af Amer 32 (L) >60 mL/min   Anion gap 7 5 - 15    Comment: Performed at [facility], [place] [address] [place], [place] [address]  Magnesium     Status: Abnormal   Collection Time: 08/24/19  1:04 AM  Result Value Ref Range   Magnesium 1.5 (L) 1.7 - 2.4 mg/dL    Comment: Performed at [facility], [place] [address], [place], [place] [address]  Phosphorus     Status: None   Collection Time: 08/24/19  1:04 AM  Result Value Ref Range   Phosphorus 3.5 2.5 - 4.6 mg/dL    Comment: Performed at [facility], [place] [address], [place], [place] [address]  Glucose, capillary     Status: Abnormal   Collection Time: 08/24/19  7:34 AM  Result Value Ref Range   Glucose-Capillary 170 (H) 70 - 99 mg/dL    Comment: Glucose reference range applies only to samples taken after fasting for at least 8 hours.  Glucose, capillary     Status: Abnormal   Collection Time: 08/24/19 11:30 AM  Result Value Ref Range   Glucose-Capillary 151 (H) 70 - 99 mg/dL    Comment: Glucose reference range applies only to samples taken after fasting for at least 8 hours.  Glucose, capillary     Status: Abnormal   Collection Time: 08/24/19  4:33 PM  Result Value Ref Range   Glucose-Capillary 323 (H) 70 - 99 mg/dL    Comment: Glucose reference range applies only to samples  taken after fasting for at least  8 hours.  Glucose, capillary     Status: Abnormal   Collection Time: 08/24/19  8:51 PM  Result Value Ref Range   Glucose-Capillary 192 (H) 70 - 99 mg/dL    Comment: Glucose reference range applies only to samples taken after fasting for at least 8 hours.  Comprehensive metabolic panel     Status: Abnormal   Collection Time: 08/25/19  5:59 AM  Result Value Ref Range   Sodium 135 135 - 145 mmol/L   Potassium 3.2 (L) 3.5 - 5.1 mmol/L   Chloride 103 98 - 111 mmol/L   CO2 24 22 - 32 mmol/L   Glucose, Bld 191 (H) 70 - 99 mg/dL    Comment: Glucose reference range applies only to samples taken after fasting for at least 8 hours.   BUN 24 (H) 6 - 20 mg/dL   Creatinine, Ser 1.57 (H) 0.44 - 1.00 mg/dL   Calcium 8.2 (L) 8.9 - 10.3 mg/dL   Total Protein 6.7 6.5 - 8.1 g/dL   Albumin 2.7 (L) 3.5 - 5.0 g/dL   AST 15 15 - 41 U/L   ALT 13 0 - 44 U/L   Alkaline Phosphatase 87 38 - 126 U/L   Total Bilirubin 0.6 0.3 - 1.2 mg/dL   GFR calc non Af Amer 37 (L) >60 mL/min   GFR calc Af Amer 43 (L) >60 mL/min   Anion gap 8 5 - 15    Comment: Performed at [facility], [place] [address], [place], [place] [address]  CBC with Differential/Platelet     Status: Abnormal   Collection Time: 08/25/19  5:59 AM  Result Value Ref Range   WBC 10.1 4.0 - 10.5 K/uL   RBC 2.87 (L) 3.87 - 5.11 MIL/uL   Hemoglobin 7.7 (L) 12.0 - 15.0 g/dL   HCT 24.9 (L) 36 - 46 %   MCV 86.8 80.0 - 100.0 fL   MCH 26.8 26.0 - 34.0 pg   MCHC 30.9 30.0 - 36.0 g/dL   RDW 14.5 11.5 - 15.5 %   Platelets 372 150 - 400 K/uL   nRBC 0.0 0.0 - 0.2 %   Neutrophils Relative % 74 %   Neutro Abs 7.5 1.7 - 7.7 K/uL   Lymphocytes Relative 16 %   Lymphs Abs 1.6 0.7 - 4.0 K/uL   Monocytes Relative 7 %   Monocytes Absolute 0.7 0 - 1 K/uL   Eosinophils Relative 3 %   Eosinophils Absolute 0.3 0 - 0 K/uL   Basophils Relative 0 %   Basophils Absolute 0.0 0 - 0 K/uL   Immature Granulocytes 0 %   Abs Immature Granulocytes 0.04 0.00 -  0.07 K/uL    Comment: Performed at [facility], [place] [address], [place], [place] [address]  Magnesium     Status: Abnormal   Collection Time: 08/25/19  5:59 AM  Result Value Ref Range   Magnesium 1.3 (L) 1.7 - 2.4 mg/dL    Comment: Performed at [facility], [place] [address], [place], [place] [address]  Phosphorus     Status: None   Collection Time: 08/25/19  5:59 AM  Result Value Ref Range   Phosphorus 2.8 2.5 - 4.6 mg/dL    Comment: Performed at [facility], [place] [address] [place], [place] [address]  Glucose, capillary     Status: Abnormal   Collection Time: 08/25/19  7:22 AM  Result Value Ref  Range   Glucose-Capillary 166 (H) 70 - 99 mg/dL    Comment: Glucose reference range applies only to samples taken after fasting for at least 8 hours.  Glucose, capillary     Status: Abnormal   Collection Time: 08/25/19 11:15 AM  Result Value Ref Range   Glucose-Capillary 115 (H) 70 - 99 mg/dL    Comment: Glucose reference range applies only to samples taken after fasting for at least 8 hours.    Studies/Results: No results found.  Medications: I have reviewed the patient's current medications.  Assessment: Anemia, hemoglobin 7.7(was 7.3/7.3/8.1) no obvious hematochezia or melena, BUN/creatinine ratio stable Has not required blood transfusions  Plan: Okay to resume Eliquis in a.m.. No endoscopic intervention planned without obvious GI bleeding. GI will sign off, please recall if needed.  Ronnette Juniper, MD 08/25/2019, 2:58 PM

## 2019-08-26 ENCOUNTER — Other Ambulatory Visit: Payer: Self-pay

## 2019-08-26 LAB — COMPREHENSIVE METABOLIC PANEL
ALT: 14 U/L (ref 0–44)
AST: 12 U/L — ABNORMAL LOW (ref 15–41)
Albumin: 2.7 g/dL — ABNORMAL LOW (ref 3.5–5.0)
Alkaline Phosphatase: 82 U/L (ref 38–126)
Anion gap: 6 (ref 5–15)
BUN: 21 mg/dL — ABNORMAL HIGH (ref 6–20)
CO2: 24 mmol/L (ref 22–32)
Calcium: 8.1 mg/dL — ABNORMAL LOW (ref 8.9–10.3)
Chloride: 103 mmol/L (ref 98–111)
Creatinine, Ser: 1.36 mg/dL — ABNORMAL HIGH (ref 0.44–1.00)
GFR calc Af Amer: 51 mL/min — ABNORMAL LOW (ref >60)
GFR calc non Af Amer: 44 mL/min — ABNORMAL LOW (ref >60)
Glucose, Bld: 182 mg/dL — ABNORMAL HIGH (ref 70–99)
Potassium: 3 mmol/L — ABNORMAL LOW (ref 3.5–5.1)
Sodium: 133 mmol/L — ABNORMAL LOW (ref 135–145)
Total Bilirubin: 0.4 mg/dL (ref 0.3–1.2)
Total Protein: 6.7 g/dL (ref 6.5–8.1)

## 2019-08-26 LAB — GLUCOSE, CAPILLARY
Glucose-Capillary: 149 mg/dL — ABNORMAL HIGH (ref 70–99)
Glucose-Capillary: 143 mg/dL — ABNORMAL HIGH (ref 70–99)
Glucose-Capillary: 318 mg/dL — ABNORMAL HIGH (ref 70–99)
Glucose-Capillary: 265 mg/dL — ABNORMAL HIGH (ref 70–99)

## 2019-08-26 LAB — CBC WITH DIFFERENTIAL/PLATELET
Abs Immature Granulocytes: 0.05 10*3/uL (ref 0.00–0.07)
Basophils Absolute: 0 10*3/uL (ref 0.0–0.1)
Basophils Relative: 0 %
Eosinophils Absolute: 0.3 10*3/uL (ref 0.0–0.5)
Eosinophils Relative: 2 %
HCT: 24.4 % — ABNORMAL LOW (ref 36.0–46.0)
Hemoglobin: 7.6 g/dL — ABNORMAL LOW (ref 12.0–15.0)
Immature Granulocytes: 1 %
Lymphocytes Relative: 17 %
Lymphs Abs: 1.9 10*3/uL (ref 0.7–4.0)
MCH: 27 pg (ref 26.0–34.0)
MCHC: 31.1 g/dL (ref 30.0–36.0)
MCV: 86.8 fL (ref 80.0–100.0)
Monocytes Absolute: 0.8 10*3/uL (ref 0.1–1.0)
Monocytes Relative: 8 %
Neutro Abs: 7.8 10*3/uL — ABNORMAL HIGH (ref 1.7–7.7)
Neutrophils Relative %: 72 %
Platelets: 398 10*3/uL (ref 150–400)
RBC: 2.81 MIL/uL — ABNORMAL LOW (ref 3.87–5.11)
RDW: 14.6 % (ref 11.5–15.5)
WBC: 10.8 10*3/uL — ABNORMAL HIGH (ref 4.0–10.5)
nRBC: 0 % (ref 0.0–0.2)

## 2019-08-26 LAB — PHOSPHORUS: Phosphorus: 2.9 mg/dL (ref 2.5–4.6)

## 2019-08-26 LAB — MAGNESIUM: Magnesium: 1.6 mg/dL — ABNORMAL LOW (ref 1.7–2.4)

## 2019-08-26 MED ORDER — METOPROLOL SUCCINATE ER 25 MG PO TB24
25.0000 mg | ORAL_TABLET | Freq: Every day | ORAL | Status: DC
  Administered 2019-08-27 – 2019-08-31 (×5): 25 mg via ORAL
  Filled 2019-08-26 (×5): qty 1

## 2019-08-26 MED ORDER — APIXABAN 5 MG PO TABS
5.0000 mg | ORAL_TABLET | Freq: Two times a day (BID) | ORAL | Status: DC
  Administered 2019-08-26 – 2019-08-31 (×10): 5 mg via ORAL
  Filled 2019-08-26 (×10): qty 1

## 2019-08-26 MED ORDER — FUROSEMIDE 10 MG/ML IJ SOLN
60.0000 mg | Freq: Two times a day (BID) | INTRAMUSCULAR | Status: AC
  Administered 2019-08-26 – 2019-08-27 (×3): 60 mg via INTRAVENOUS
  Filled 2019-08-26 (×3): qty 6

## 2019-08-26 MED ORDER — METOPROLOL TARTRATE 5 MG/5ML IV SOLN: 2.5000 mg | INTRAVENOUS | Status: DC | PRN

## 2019-08-26 MED ORDER — METOPROLOL SUCCINATE ER 25 MG PO TB24
12.5000 mg | ORAL_TABLET | Freq: Once | ORAL | Status: AC
  Administered 2019-08-26: 12.5 mg via ORAL
  Filled 2019-08-26: qty 1

## 2019-08-26 MED ORDER — POTASSIUM CHLORIDE CRYS ER 20 MEQ PO TBCR
40.0000 meq | EXTENDED_RELEASE_TABLET | ORAL | Status: AC
  Administered 2019-08-26 (×2): 40 meq via ORAL
  Filled 2019-08-26 (×2): qty 2

## 2019-08-26 MED ORDER — MAGNESIUM SULFATE 2 GM/50ML IV SOLN
2.0000 g | Freq: Once | INTRAVENOUS | Status: AC
  Administered 2019-08-26: 2 g via INTRAVENOUS
  Filled 2019-08-26: qty 50

## 2019-08-26 MED ORDER — HYDROMORPHONE HCL 1 MG/ML IJ SOLN
0.5000 mg | Freq: Once | INTRAMUSCULAR | Status: AC
  Administered 2019-08-26: 0.5 mg via INTRAVENOUS

## 2019-08-26 NOTE — Progress Notes (Signed)
PROGRESS NOTE    Deborah [name]  UGQ:[id number] DOB: December 18, 1965 DOA: 08/19/2019 PCP: [name], DO   Chief Complaint  Patient presents with  . [name]  . Chest Pain  . Emesis    Brief Narrative:  [name] y.o.African-American femalewith [name] known history of type II obese mellitus, chronic systolic CHF, hypertension, dyslipidemia and GERD as well as gastroparesis, presented to the emergency room with acute onset of intractable vomiting with associated epigastric and lower abdominal pain. Denies hematemesis or malena. Also had chest pain felt as pressure and graded 5/10 in severity with radiation to her left arm and associated mild dyspnea without complications.   Upon presentation to the emergency room, blood pressure was 153/105 with heart rate of 115 and RR 24 and temperature 98. Labs revealed hypokalemia of 3.1 glucose of 218 with Kyvon Hu BUN of 30 and creatinine 1.61 compared to 30/1.28 08/07/2019. Serum lipase was 34 and BNP 60.8. High-sensitivity troponin I was 26 and later 21. CBC showed minimal leukocytosis of 11.7 with neutrophilia and anemia better than previous levels. Chest x-ray showed cardiomegaly without acute cardiopulmonary disease.EKG showed sinus tachycardia with Jaylee Lantry rate of 116 with left axis deviation poor R wave progression.  The patient was given Dilaudid 1 mg IV twice as well as 4 mg of IV Zofran and 1 L bolus of IV normal saline. She was still having recurrent epigastric pain and nausea.   Assessment & Plan:   Active Problems:   Diabetic gastroparesis ([place])  1. Diabetic gastroparesis with intractable epigastric pain as well as recurrent nausea and vomiting. - Continue hydration with IV normal saline. - Continue as needed IV Zofran and Phenergan. Apparently she has not tolerated Reglan in the past. It apparently gave her extraparametal symptoms and tachycardia.   - trial of erythromycin  - CT without acute findings -  oxycodone, on IV dilaudid prn, continuing taper -Continue Protonix. -On clear liquids to be advanced as tolerated. - CT from 5/28 with 15 mm metallic density in region of duodenal bulb - consider repeat imaging if persistent pain - reports don't always line up with nursing.  Will request nursing to document whether she's had vomiting each shift.  Hopefully working towards d/c soon with erythromycin.  # Atrial Fibrillation with RVR: tachycardic today, increase metoprolol, add prn metop with holding parameters.  Eliquis to restart today.  # Anemia  Iron Def Anemia: Hb stable at 7.7 today.  Positive FOBT, but no obvious blood in stool per discussion with RN.  Continue to trend H/H.  Vitals stable. Labs suggestive of iron def anemia GI c/s, recommending holding eliquis, PPI, appreciate assistance - no endoscopic intervention planned.  Will restart eliquis.  2. Hypokalemia/hypomagnesemia. - replace and follow  3. Acute kidney injury. -creatinine 1.36 today, improved, stable, baseline ~1.3-1.8 - Per holding further IVF and resuming torsemide with LE edema in setting of IVF - follow UP/C (1.69) - UA with RBC's, WBC's and proteinuria - follow outpatient - per renal, ? New baseline  4. Chest pain, likely atypical. - troponin flat, low  - suspect this is related to epigastric pain - consider additional w/u if worsening or concerning symptoms  5.Type 2 diabetes mellitus. -Continue lantus 30 units daily (lower dose) -Consulted diabetes coordinator.  6. Hypertension. -On Imdur, Toprol-XL which is supposed to be her home medications.  Hydral on hold.  Torsemide resumed.  7. Dyslipidemia. -We will continue statin therapy.  8. Anxiety. -Continue Xanax.  9.COPD without exacerbation. -Continue her nebulizer  therapy.  # LE edema: pain and swelling, she notes little improvement with torsemide.  Will try IV lasix and follow response.   I/O, daily weights  DVT prophylaxis:  eliquis on hold -> SCD's Code Status: full  Family Communication: none at bedside Disposition:   Status is: Inpatient  Remains inpatient appropriate because:Inpatient level of care appropriate due to severity of illness   Dispo: The patient is from: Home              Anticipated d/c is to: Home              Anticipated d/c date is: 2 days              Patient currently is not medically stable to d/c.   Consultants:   nephrology  Procedures:  LE [place] Summary:  RIGHT:  - Findings appear essentially unchanged compared to previous examination.  - There is no evidence of deep vein thrombosis in the lower extremity.  However, portions of this examination were limited- see technologist  comments above.    LEFT:  - Findings appear essentially unchanged compared to previous examination.  - There is no evidence of deep vein thrombosis in the lower extremity.  However, portions of this examination were limited- see technologist  comments above.   Antimicrobials:  Anti-infectives (From admission, onward)   Start     Dose/Rate Route Frequency Ordered Stop   08/23/19 1300  erythromycin 250 mg in sodium chloride 0.9 % 100 mL IVPB     Discontinue     250 mg 100 mL/hr over 60 Minutes Intravenous Every 8 hours 08/23/19 1152       Subjective: C/o continued nausea/vomiting  Continued abdominal pain Continued LE pain  Objective: Vitals:   08/26/19 1333 08/26/19 1437 08/26/19 1523 08/26/19 1632  BP: 96/72 (!) 121/44 131/74 126/73  Pulse: (!) 141 (!) 104 (!) 102 (!) 102  Resp: 16 16 16 16   Temp: (!) 97.3 F (36.3 C) (!) 97.3 F (36.3 C) (!) 97.3 F (36.3 C) (!) 97.3 F (36.3 C)  TempSrc:  Oral    SpO2: 97% 100% 99% 97%  Weight:      Height:        Intake/Output Summary (Last 24 hours) at 08/26/2019 1709 Last data filed at 08/26/2019 1302 Gross per 24 hour  Intake 710 ml  Output --  Net 710 ml   Filed Weights   08/22/19 0500 08/23/19 0553 08/26/19 0500  Weight: (!) 155.5  kg (!) 154.8 kg (!) 152 kg    Examination:  General: No acute distress. Cardiovascular: Heart sounds show [name] regular rate, and rhythm Lungs: Clear to auscultation bilaterally Abdomen: Soft, mild diffuse abdominal discomfort Neurological: Alert and oriented 3. Moves all extremities 4. Cranial nerves II through XII grossly intact. Skin: Warm and dry. No rashes or lesions. Extremities: bilateral LE edema   Data Reviewed: I have personally reviewed following labs and imaging studies  CBC: Recent Labs  Lab 08/21/19 0523 08/21/19 0523 08/23/19 0515 08/23/19 1004 08/24/19 0104 08/25/19 0559 08/26/19 0800  WBC 10.0  --  11.2*  --  11.0* 10.1 10.8*  NEUTROABS  --   --  8.5*  --  8.3* 7.5 7.8*  HGB 11.3*   < > 7.3* 7.3* 8.1* 7.7* 7.6*  HCT 36.1   < > 23.8* 23.8* 25.9* 24.9* 24.4*  MCV 86.8  --  88.8  --  88.1 86.8 86.8  PLT 268  --  275  --  288 372 398   < > = values in this interval not displayed.    Basic Metabolic Panel: Recent Labs  Lab 08/22/19 0911 08/23/19 0515 08/24/19 0104 08/25/19 0559 08/26/19 0536  NA 134* 133* 135 135 133*  K 3.6 3.7 3.3* 3.2* 3.0*  CL 106 105 106 103 103  CO2 21* 19* 22 24 24   GLUCOSE 147* 237* 178* 191* 182*  BUN 23* 27* 26* 24* 21*  CREATININE 2.00* 2.05* 1.97* 1.57* 1.36*  CALCIUM 8.1* 8.0* 8.5* 8.2* 8.1*  MG 1.8 1.7 1.5* 1.3* 1.6*  PHOS  --  4.1 3.5 2.8 2.9    GFR: Estimated Creatinine Clearance: 71.1 mL/min (Ardelia Wrede) (by C-G formula based on SCr of 1.36 mg/dL (H)).  Liver Function Tests: Recent Labs  Lab 08/22/19 0911 08/23/19 0515 08/24/19 0104 08/25/19 0559 08/26/19 0536  AST 11* 9* 9* 15 12*  ALT 14 11 12 13 14   ALKPHOS 86 88 91 87 82  BILITOT 0.6 0.2* 0.5 0.6 0.4  PROT 6.6 6.0* 6.9 6.7 6.7  ALBUMIN 2.9* 2.5* 2.8* 2.7* 2.7*    CBG: Recent Labs  Lab 08/25/19 1115 08/25/19 1657 08/25/19 2217 08/26/19 0829 08/26/19 1234  GLUCAP 115* 253* 231* 149* 143*     Recent Results (from the past 240 hour(s))  SARS  Coronavirus 2 by RT PCR (hospital order, performed in [facility] hospital lab) Nasopharyngeal Nasopharyngeal Swab     Status: None   Collection Time: 08/19/19  7:23 PM   Specimen: Nasopharyngeal Swab  Result Value Ref Range Status   SARS Coronavirus 2 NEGATIVE NEGATIVE Final    Comment: (NOTE) SARS-CoV-2 target nucleic acids are NOT DETECTED.  The SARS-CoV-2 RNA is generally detectable in upper and lower respiratory specimens during the acute phase of infection. The lowest concentration of SARS-CoV-2 viral copies this assay can detect is 250 copies / mL. Jereline Ticer negative result does not preclude SARS-CoV-2 infection and should not be used as the sole basis for treatment or other patient management decisions.  Domanic Matusek negative result may occur with improper specimen collection / handling, submission of specimen other than nasopharyngeal swab, presence of viral mutation(s) within the areas targeted by this assay, and inadequate number of viral copies (<250 copies / mL). Kaleigh Spiegelman negative result must be combined with clinical observations, patient history, and epidemiological information.  Fact Sheet for Patients:   StrictlyIdeas.no  Fact Sheet for Healthcare Providers: BankingDealers.co.za  This test is not yet approved or  cleared by the [place] FDA and has been authorized for detection and/or diagnosis of SARS-CoV-2 by FDA under an Emergency Use Authorization (EUA).  This EUA will remain in effect (meaning this test can be used) for the duration of the COVID-19 declaration under Section 564(b)(1) of the Act, 21 U.S.C. section 360bbb-3(b)(1), unless the authorization is terminated or revoked sooner.  Performed at [facility], [place] [address] [place], [place] [address]          Radiology Studies: No results found.      Scheduled Meds: . atorvastatin  10 mg Oral Daily  . DULoxetine  40 mg Oral Daily  . ferrous  sulfate  325 mg Oral Q breakfast  . furosemide  60 mg Intravenous BID  . insulin aspart  0-20 Units Subcutaneous TID PC & HS  . insulin aspart  4 Units Subcutaneous TID WC  . insulin glargine  30 Units Subcutaneous QHS  . isosorbide mononitrate  30 mg Oral Daily  . loratadine  10 mg  Oral Daily  . melatonin  6 mg Oral QHS  . metoprolol succinate  12.5 mg Oral Once  . [START ON 08/27/2019] metoprolol succinate  25 mg Oral Daily  . pantoprazole  40 mg Oral BID   Continuous Infusions: . sodium chloride 250 mL (08/23/19 2359)  . erythromycin 250 mg (08/26/19 1302)     LOS: 6 days    Time spent: over 30 min    Fayrene Helper, MD [facility]   To contact the attending provider between 7A-7P or the covering provider during after hours 7P-7A, please log into the web site [email or web address] and access using universal [place] password for that web site. If you do not have the password, please call the hospital operator.  08/26/2019, 5:09 PM

## 2019-08-26 NOTE — Plan of Care (Signed)
  Problem: Health Behavior/Discharge Planning: Goal: Ability to manage health-related needs will improve Outcome: Progressing   Problem: Clinical Measurements: Goal: Ability to maintain clinical measurements within normal limits will improve Outcome: Progressing Goal: Will remain free from infection Outcome: Progressing Goal: Diagnostic test results will improve Outcome: Progressing Goal: Cardiovascular complication will be avoided Outcome: Progressing   Problem: Activity: Goal: Risk for activity intolerance will decrease Outcome: Progressing   Problem: Nutrition: Goal: Adequate nutrition will be maintained Outcome: Progressing   Problem: Coping: Goal: Level of anxiety will decrease Outcome: Progressing   Problem: Pain Managment: Goal: General experience of comfort will improve Outcome: Progressing   Problem: Safety: Goal: Ability to remain free from injury will improve Outcome: Progressing   Problem: Skin Integrity: Goal: Risk for impaired skin integrity will decrease Outcome: Progressing

## 2019-08-26 NOTE — Progress Notes (Signed)
   08/26/19 1333  Assess: MEWS Score  Temp (!) 97.3 F (36.3 C)  BP 96/72  Pulse Rate (!) 141  ECG Heart Rate (!) 133  Resp 16  Level of Consciousness Alert  SpO2 97 %  O2 Device Room Air  Assess: MEWS Score  MEWS Temp 0  MEWS Systolic 1  MEWS Pulse 3  MEWS RR 0  MEWS LOC 0  MEWS Score 4  MEWS Score Color Red  Assess: if the MEWS score is Yellow or Red  Were vital signs taken at a resting state? Yes  Focused Assessment Documented focused assessment  Early Detection of Sepsis Score *See Row Information* Low  MEWS guidelines implemented *See Row Information* Yes  Treat  MEWS Interventions Escalated (See documentation below)  Take Vital Signs  Increase Vital Sign Frequency  Red: Q 1hr X 4 then Q 4hr X 4, if remains red, continue Q 4hrs  Escalate  MEWS: Escalate Red: discuss with charge nurse/RN and provider, consider discussing with RRT  Notify: Charge Nurse/RN  Name of Charge Nurse/RN Notified Sharyn Blitz  Date Charge Nurse/RN Notified 08/26/19  Time Charge Nurse/RN Notified 1355  Notify: Provider  Provider Name/Title Dr. Florene Glen  Date Provider Notified 08/26/19  Time Provider Notified 1335  Notification Type Page  Notification Reason Other (Comment) (elevated HR)  Response See new orders  Date of Provider Response 08/26/19  Time of Provider Response 1339

## 2019-08-26 NOTE — Progress Notes (Signed)
ANTICOAGULATION CONSULT NOTE - Initial Consult  Pharmacy Consult for apixaban Indication: atrial fibrillation  Allergies  Allergen Reactions  . Contrast Media [Iodinated Diagnostic Agents] Anaphylaxis    Cardiac arrest  . Diazepam [name]  . Isovue [Iopamidol] Anaphylaxis    Patient had seizure like activity and then code post 100 cc of isovue 300  . Lisinopril Anaphylaxis    Tongue and mouth swelling  . Penicillins Palpitations    Has patient had a PCN reaction causing immediate rash, facial/tongue/throat swelling, SOB or lightheadedness with hypotension: Yes, heart races Has patient had a PCN reaction causing severe rash involving mucus membranes or skin necrosis: No Has patient had a PCN reaction that required hospitalization: Yes  Has patient had a PCN reaction occurring within the last 10 years: No   . Acetaminophen Nausea Only and Other (See Comments)    Irritates stomach ulcer  Abdominal pain  . Tolmetin Nausea Only and Other (See Comments)    ULCER  . Aspirin Other (See Comments)    Irritates stomach ulcer   . Metoclopramide Other (See Comments)    Tardive dyskinesia  . Nsaids Other (See Comments)    ULCER  . Tramadol Nausea And Vomiting    Patient Measurements: Height: _0  (167.6 cm) Weight: (!) 152 kg (335 lb 1.6 oz) IBW/kg (Calculated) : 59.3 Heparin Dosing Weight:   Vital Signs: Temp: 97.3 F (36.3 C) (06/20 1632) Temp Source: Oral (06/20 1437) BP: 126/73 (06/20 1632) Pulse Rate: 102 (06/20 1632)  Labs: Recent Labs    08/24/19 0104 08/24/19 0104 08/25/19 0559 08/26/19 0536 08/26/19 0800  HGB 8.1*   < > 7.7*  --  7.6*  HCT 25.9*  --  24.9*  --  24.4*  PLT 288  --  372  --  398  CREATININE 1.97*  --  1.57* 1.36*  --    < > = values in this interval not displayed.    Estimated Creatinine Clearance: 71.1 mL/min (A) (by C-G formula based on SCr of 1.36 mg/dL (H)).   Medical History: Past Medical History:  Diagnosis Date  . Acute  back pain with sciatica, left   . Acute back pain with sciatica, right   . AKI (acute kidney injury) ([place])   . Anemia, unspecified   . Chronic pain   . Chronic systolic CHF (congestive heart failure) ([place])   . Diabetes mellitus   . Esophageal reflux   . Fibromyalgia   . Gastric ulcer   . Gastroparesis   . Gout   . Hyperlipidemia   . Hypertension   . Lumbosacral stenosis   . NICM (nonischemic cardiomyopathy) ([place])   . Obesity   . PAF (paroxysmal atrial fibrillation) ([place])   . Stroke ([place]) 02/2011  . Vitamin B12 deficiency anemia     Medications:  Medications Prior to Admission  Medication Sig Dispense Refill Last Dose  . albuterol (PROVENTIL) (2.5 MG/3ML) 0.083% nebulizer solution Take 3 mLs (2.5 mg total) by nebulization every 6 (six) hours as needed for wheezing or shortness of breath. 150 mL 1 unknown  . ALPRAZolam (XANAX) 1 MG tablet Take 1 tablet (1 mg total) by mouth 2 (two) times daily as needed for anxiety. 15 tablet 0 08/19/2019 at Unknown time  . atorvastatin (LIPITOR) 10 MG tablet Take 1 tablet (10 mg total) by mouth daily. 30 tablet 11 08/19/2019 at Unknown time  . cetirizine (ZYRTEC) 10 MG tablet Take 1 tablet (10 mg total) by mouth daily. 30 tablet 11  08/19/2019 at Unknown time  . dicyclomine (BENTYL) 20 MG tablet Take 1 tablet (20 mg total) by mouth 3 (three) times daily as needed for spasms (abdominal cramping). 90 tablet 0 08/19/2019 at Unknown time  . DULoxetine HCl 40 MG CPEP TAKE ONE CAPSULE BY MOUTH TWICE DAILY (Patient taking differently: Take 40 mg by mouth in the morning and at bedtime. ) 60 capsule 2 08/19/2019 at Unknown time  . ELIQUIS 5 MG TABS tablet TAKE 1 TABLET (5 MG TOTAL) BY MOUTH 2 (TWO) TIMES DAILY. (Patient taking differently: Take 5 mg by mouth 2 (two) times daily. ) 60 tablet 0 08/19/2019 at 2200  . fluticasone (FLONASE) 50 MCG/ACT nasal spray Place 2 sprays into both nostrils daily as needed for allergies or rhinitis. 16 g 2 unknown  . hydrALAZINE  (APRESOLINE) 25 MG tablet Take 1 tablet (25 mg total) by mouth 3 (three) times daily. For blood pressure 90 tablet 2 08/19/2019 at Unknown time  . insulin aspart (NOVOLOG FLEXPEN) 100 UNIT/ML FlexPen Inject 15 Units into the skin 3 (three) times daily with meals. (Patient taking differently: Inject 30 Units into the skin 3 (three) times daily with meals. ) 15 mL 0 08/19/2019 at Unknown time  . isosorbide mononitrate (IMDUR) 60 MG 24 hr tablet Take 1 tablet (60 mg total) by mouth daily. 30 tablet 1 08/19/2019 at Unknown time  . LANTUS SOLOSTAR 100 UNIT/ML Solostar Pen Inject 45 Units into the skin at bedtime.   08/18/2019 at Unknown time  . loperamide (IMODIUM) 2 MG capsule Take 2 mg by mouth 4 (four) times daily as needed for diarrhea or loose stools.   unknown  . magnesium oxide (MAG-OX) 400 (241.3 Mg) MG tablet Take 1 tablet (400 mg total) by mouth 2 (two) times daily. 60 tablet 0 08/19/2019 at Unknown time  . Melatonin 3 MG TABS Take 2 tablets (6 mg total) by mouth at bedtime. 30 tablet 6 08/18/2019 at Unknown time  . metoprolol succinate (TOPROL-XL) 25 MG 24 hr tablet Take 25 mg by mouth daily.   08/19/2019 at 0900  . nitroGLYCERIN (NITROSTAT) 0.4 MG SL tablet Place 1 tablet (0.4 mg total) under the tongue every 5 (five) minutes as needed for chest pain. 30 tablet 1 unknown  . ondansetron (ZOFRAN-ODT) 4 MG disintegrating tablet Take 1 tablet (4 mg total) by mouth every 8 (eight) hours as needed for nausea or vomiting. 30 tablet 0 unknown  . pantoprazole (PROTONIX) 40 MG tablet Take 1 tablet (40 mg total) by mouth 2 (two) times daily before a meal. 60 tablet 3 08/19/2019 at Unknown time  . torsemide (DEMADEX) 20 MG tablet Take 2 tablets (40 mg total) by mouth daily. Hold if having significant vomiting   08/19/2019 at Unknown time  . blood glucose meter kit and supplies KIT Dispense based on patient and insurance preference. Use up to four times daily as directed. (FOR ICD-9 250.00, 250.01). 1 each 0   .  dicyclomine (BENTYL) 20 MG tablet TAKE 1 TABLET (20 MG TOTAL) BY MOUTH 3 (THREE) TIMES DAILY AS NEEDED FOR SPASMS (ABDOMINAL CRAMPING). (Patient not taking: Reported on 06/29/2019) 90 tablet 0     Assessment: 54 yo F on apixaban 5 mg po BID PTA for Afib.  Apixaban was continued on admission then held 6/18 for low Hg, positive FOBT and AKI.  No endoscopic intervention planned by GI. OK to resume apixaban per GI recs.  Now SCr down to 1.36, Hg 7.6, pltc WNL.    Plan:  Resume apixaban 5 mg po bid for Afib Pharmacy to sign off  Eudelia Bunch, Pharm.D 08/26/2019 5:35 PM

## 2019-08-27 ENCOUNTER — Ambulatory Visit: Payer: Medicare Other

## 2019-08-27 LAB — CBC WITH DIFFERENTIAL/PLATELET
Abs Immature Granulocytes: 0.05 10*3/uL (ref 0.00–0.07)
Basophils Absolute: 0.1 10*3/uL (ref 0.0–0.1)
Basophils Relative: 1 %
Eosinophils Absolute: 0.3 10*3/uL (ref 0.0–0.5)
Eosinophils Relative: 3 %
HCT: 26.3 % — ABNORMAL LOW (ref 36.0–46.0)
Hemoglobin: 8.1 g/dL — ABNORMAL LOW (ref 12.0–15.0)
Immature Granulocytes: 1 %
Lymphocytes Relative: 19 %
Lymphs Abs: 1.9 10*3/uL (ref 0.7–4.0)
MCH: 26.6 pg (ref 26.0–34.0)
MCHC: 30.8 g/dL (ref 30.0–36.0)
MCV: 86.2 fL (ref 80.0–100.0)
Monocytes Absolute: 0.7 10*3/uL (ref 0.1–1.0)
Monocytes Relative: 8 %
Neutro Abs: 6.9 10*3/uL (ref 1.7–7.7)
Neutrophils Relative %: 68 %
Platelets: 394 10*3/uL (ref 150–400)
RBC: 3.05 MIL/uL — ABNORMAL LOW (ref 3.87–5.11)
RDW: 14.6 % (ref 11.5–15.5)
WBC: 9.9 10*3/uL (ref 4.0–10.5)
nRBC: 0 % (ref 0.0–0.2)

## 2019-08-27 LAB — COMPREHENSIVE METABOLIC PANEL
ALT: 13 U/L (ref 0–44)
AST: 11 U/L — ABNORMAL LOW (ref 15–41)
Albumin: 2.9 g/dL — ABNORMAL LOW (ref 3.5–5.0)
Alkaline Phosphatase: 86 U/L (ref 38–126)
Anion gap: 11 (ref 5–15)
BUN: 24 mg/dL — ABNORMAL HIGH (ref 6–20)
CO2: 23 mmol/L (ref 22–32)
Calcium: 8.4 mg/dL — ABNORMAL LOW (ref 8.9–10.3)
Chloride: 100 mmol/L (ref 98–111)
Creatinine, Ser: 1.48 mg/dL — ABNORMAL HIGH (ref 0.44–1.00)
GFR calc Af Amer: 46 mL/min — ABNORMAL LOW (ref >60)
GFR calc non Af Amer: 39 mL/min — ABNORMAL LOW (ref >60)
Glucose, Bld: 152 mg/dL — ABNORMAL HIGH (ref 70–99)
Potassium: 3.1 mmol/L — ABNORMAL LOW (ref 3.5–5.1)
Sodium: 134 mmol/L — ABNORMAL LOW (ref 135–145)
Total Bilirubin: 0.5 mg/dL (ref 0.3–1.2)
Total Protein: 6.8 g/dL (ref 6.5–8.1)

## 2019-08-27 LAB — GLUCOSE, CAPILLARY
Glucose-Capillary: 129 mg/dL — ABNORMAL HIGH (ref 70–99)
Glucose-Capillary: 110 mg/dL — ABNORMAL HIGH (ref 70–99)
Glucose-Capillary: 305 mg/dL — ABNORMAL HIGH (ref 70–99)

## 2019-08-27 LAB — PHOSPHORUS: Phosphorus: 3.8 mg/dL (ref 2.5–4.6)

## 2019-08-27 LAB — MAGNESIUM: Magnesium: 1.6 mg/dL — ABNORMAL LOW (ref 1.7–2.4)

## 2019-08-27 MED ORDER — HYDROMORPHONE HCL 2 MG PO TABS: 2.0000 mg | ORAL_TABLET | Freq: Four times a day (QID) | ORAL | Status: DC | PRN

## 2019-08-27 MED ORDER — MAGNESIUM SULFATE 2 GM/50ML IV SOLN
2.0000 g | Freq: Once | INTRAVENOUS | Status: AC
  Administered 2019-08-27: 2 g via INTRAVENOUS
  Filled 2019-08-27: qty 50

## 2019-08-27 MED ORDER — HYDROMORPHONE HCL 2 MG PO TABS
1.0000 mg | ORAL_TABLET | Freq: Four times a day (QID) | ORAL | Status: DC | PRN
  Administered 2019-08-27 – 2019-08-31 (×10): 1 mg via ORAL
  Filled 2019-08-27 (×10): qty 1

## 2019-08-27 MED ORDER — POTASSIUM CHLORIDE CRYS ER 20 MEQ PO TBCR
40.0000 meq | EXTENDED_RELEASE_TABLET | ORAL | Status: AC
  Administered 2019-08-27 (×2): 40 meq via ORAL
  Filled 2019-08-27 (×2): qty 2

## 2019-08-27 MED ORDER — HYDROMORPHONE HCL 2 MG PO TABS: 2.0000 mg | ORAL_TABLET | ORAL | Status: DC | PRN

## 2019-08-27 NOTE — Chronic Care Management (AMB) (Signed)
   RN Case Manager Care Management Admission Note 08/27/2019   RNCM notified that LEILYN FRAYRE  MRN: [id number] DOB: Jun 11, 1965 was admitted to the hospital for Gastroparesis on 08/19/19.  RNCM was due to call patient for follow up call today.     Plan:  RNCM will monitor the patients progress  Lazaro Arms RN, BSN, [facility] Care Management Coordinator [place] Phone: [phone] Fax: [phone]

## 2019-08-27 NOTE — Plan of Care (Signed)
  Problem: Health Behavior/Discharge Planning: Goal: Ability to manage health-related needs will improve Outcome: Progressing   Problem: Clinical Measurements: Goal: Ability to maintain clinical measurements within normal limits will improve Outcome: Progressing Goal: Will remain free from infection Outcome: Progressing Goal: Diagnostic test results will improve Outcome: Progressing Goal: Cardiovascular complication will be avoided Outcome: Progressing   Problem: Activity: Goal: Risk for activity intolerance will decrease Outcome: Progressing   Problem: Nutrition: Goal: Adequate nutrition will be maintained Outcome: Progressing   Problem: Coping: Goal: Level of anxiety will decrease Outcome: Progressing   Problem: Pain Managment: Goal: General experience of comfort will improve Outcome: Progressing   Problem: Safety: Goal: Ability to remain free from injury will improve Outcome: Progressing   Problem: Skin Integrity: Goal: Risk for impaired skin integrity will decrease Outcome: Progressing

## 2019-08-27 NOTE — Plan of Care (Signed)
  Problem: Clinical Measurements: Goal: Ability to maintain clinical measurements within normal limits will improve Outcome: Progressing Goal: Will remain free from infection Outcome: Progressing Goal: Diagnostic test results will improve Outcome: Progressing Goal: Cardiovascular complication will be avoided Outcome: Progressing   Problem: Activity: Goal: Risk for activity intolerance will decrease Outcome: Progressing   Problem: Nutrition: Goal: Adequate nutrition will be maintained Outcome: Progressing   Problem: Coping: Goal: Level of anxiety will decrease Outcome: Progressing

## 2019-08-27 NOTE — Plan of Care (Signed)
  Problem: Health Behavior/Discharge Planning: Goal: Ability to manage health-related needs will improve Outcome: Progressing   

## 2019-08-27 NOTE — Progress Notes (Signed)
PT Cancellation Note  Patient Details Name: Deborah [name] MRN: [id number] DOB: March 10, 1965   Cancelled Treatment:    Reason Eval/Treat Not Completed: PT screened, no needs identified, will sign off Pt declined PT 08/21/19.  Received new order and checked back with pt.  Pt reports she is transferring in her room and again declines need for PT.  PT to sign off.   Aveena Bari,KATHrine E 08/27/2019, 1:59 PM Jannette Spanner PT, DPT Acute Rehabilitation Services Pager: [phone] Office: [phone]

## 2019-08-27 NOTE — Progress Notes (Signed)
PROGRESS NOTE    Deborah [name]  IDP:[id number] DOB: January 07, 1966 DOA: 08/19/2019 PCP: Deborah Alpha, DO   Chief Complaint  Patient presents with  . [name]  . Chest Pain  . Emesis    Brief Narrative:  [name] y.o.African-American femalewith Deborah [name] known history of type II obese mellitus, chronic systolic CHF, hypertension, dyslipidemia and GERD as well as gastroparesis, presented to the emergency room with acute onset of intractable vomiting with associated epigastric and lower abdominal pain. Denies hematemesis or malena. Also had chest pain felt as pressure and graded 5/10 in severity with radiation to her left arm and associated mild dyspnea without complications.   Upon presentation to the emergency room, blood pressure was 153/105 with heart rate of 115 and RR 24 and temperature 98. Labs revealed hypokalemia of 3.1 glucose of 218 with Deborah [name] BUN of 30 and creatinine 1.61 compared to 30/1.28 08/07/2019. Serum lipase was 34 and BNP 60.8. High-sensitivity troponin I was 26 and later 21. CBC showed minimal leukocytosis of 11.7 with neutrophilia and anemia better than previous levels. Chest x-ray showed cardiomegaly without acute cardiopulmonary disease.EKG showed sinus tachycardia with Deborah [name] rate of 116 with left axis deviation poor R wave progression.  The patient was given Dilaudid 1 mg IV twice as well as 4 mg of IV Zofran and 1 L bolus of IV normal saline. She was still having recurrent epigastric pain and nausea.   Assessment & Plan:   Active Problems:   Diabetic gastroparesis ([name])  1. Diabetic gastroparesis with intractable epigastric pain as well as recurrent nausea and vomiting. - Continue hydration with IV normal saline. - Continue as needed IV Zofran and Phenergan. Apparently she has not tolerated Reglan in the past. It apparently gave her extraparametal symptoms and tachycardia.   - trial of erythromycin  - no more IV dilaudid -> she can  tolerate PO meds (unfortunately, has allergies or intolerances to most alternative meds) - po dilaudid  -Continue Protonix. -On clear liquids to be advanced as tolerated. - CT from 5/28 with 15 mm metallic density in region of duodenal bulb - consider repeat imaging if persistent pain - repeat CT scan without acute abnormality  - reports don't always line up with nursing.  Will request nursing to document how's she's doing with diet/nausea/vomiting.  She said she didn't eat much yesterday with nausea.  Hopefully working towards d/c soon with erythromycin.  # Volume Overload  LE edema: pain and swelling, she notes little improvement with torsemide.  Will try IV lasix and follow response.   147 kg at presentation, up to 155.5 kg on 3/16 152 kg 6/20 Update weight for today I/O, daily weights - downtrending, will continue to monitor  # Atrial Fibrillation with RVR: improved.  Continue metoprolol.  Continue eliquis.  # Anemia  Iron Def Anemia: Hb stable at 7.7 today.  Positive FOBT, but no obvious blood in stool per discussion with RN.  Continue to trend H/H.  Vitals stable. Labs suggestive of iron def anemia GI c/s, recommending holding eliquis, PPI, appreciate assistance - no endoscopic intervention planned.  Will restart eliquis.  2. Hypokalemia/hypomagnesemia. - replace and follow  3. Acute kidney injury. -creatinine 1.48 today, improved, stable, baseline ~1.3-1.8 - currently being diuresed in setting of LE edema - follow UP/C (1.69) - UA with RBC's, WBC's and proteinuria - follow outpatient - per renal, ? New baseline  4. Chest pain, likely atypical. - troponin flat, low  - suspect this is related to epigastric  pain - consider additional w/u if worsening or concerning symptoms  5.Type 2 diabetes mellitus. -Continue lantus 30 units daily (lower dose) -Consulted diabetes coordinator.  6. Hypertension. -On Imdur, Toprol-XL which is supposed to be her home medications.   Hydral on hold.  Torsemide resumed.  7. Dyslipidemia. -We will continue statin therapy.  8. Anxiety. -Continue Xanax.  9.COPD without exacerbation. -Continue her nebulizer therapy.  DVT prophylaxis: eliquis on hold -> SCD's Code Status: full  Family Communication: none at bedside Disposition:   Status is: Inpatient  Remains inpatient appropriate because:Inpatient level of care appropriate due to severity of illness   Dispo: The patient is from: Home              Anticipated d/c is to: Home              Anticipated d/c date is: 2 days              Patient currently is not medically stable to d/c.   Consultants:   nephrology  Procedures:  LE [place] Summary:  RIGHT:  - Findings appear essentially unchanged compared to previous examination.  - There is no evidence of deep vein thrombosis in the lower extremity.  However, portions of this examination were limited- see technologist  comments above.    LEFT:  - Findings appear essentially unchanged compared to previous examination.  - There is no evidence of deep vein thrombosis in the lower extremity.  However, portions of this examination were limited- see technologist  comments above.   Antimicrobials:  Anti-infectives (From admission, onward)   Start     Dose/Rate Route Frequency Ordered Stop   08/23/19 1300  erythromycin 250 mg in sodium chloride 0.9 % 100 mL IVPB     Discontinue     250 mg 100 mL/hr over 60 Minutes Intravenous Every 8 hours 08/23/19 1152       Subjective: C/o nausea, says she didn't eat anything yesterday  Objective: Vitals:   08/26/19 1734 08/26/19 2137 08/27/19 0201 08/27/19 1225  BP: (!) 145/81 (!) 148/78 118/69 (!) 138/93  Pulse: 98 90 90 99  Resp: 18 18 17 16   Temp:  97.7 F (36.5 C) 98.2 F (36.8 C) 98 F (36.7 C)  TempSrc:  Oral Oral Oral  SpO2: 98% 98% 100% 100%  Weight:      Height:        Intake/Output Summary (Last 24 hours) at 08/27/2019 1424 Last data filed at  08/26/2019 2204 Gross per 24 hour  Intake 440 ml  Output --  Net 440 ml   Filed Weights   08/22/19 0500 08/23/19 0553 08/26/19 0500  Weight: (!) 155.5 kg (!) 154.8 kg (!) 152 kg    Examination:  General: No acute distress. Cardiovascular: Heart sounds show Amar Keenum regular rate, and rhythm.  Lungs: Clear to auscultation bilaterally with good air movement. No rales, rhonchi or wheezes. Abdomen: Soft, nontender, nondistended  Neurological: Alert and oriented 3. Moves all extremities 4. Cranial nerves II through XII grossly intact. Skin: Warm and dry. No rashes or lesions. Extremities: No clubbing or cyanosis. No edema.  Data Reviewed: I have personally reviewed following labs and imaging studies  CBC: Recent Labs  Lab 08/23/19 0515 08/23/19 0515 08/23/19 1004 08/24/19 0104 08/25/19 0559 08/26/19 0800 08/27/19 0526  WBC 11.2*  --   --  11.0* 10.1 10.8* 9.9  NEUTROABS 8.5*  --   --  8.3* 7.5 7.8* 6.9  HGB 7.3*   < > 7.3*  8.1* 7.7* 7.6* 8.1*  HCT 23.8*   < > 23.8* 25.9* 24.9* 24.4* 26.3*  MCV 88.8  --   --  88.1 86.8 86.8 86.2  PLT 275  --   --  288 372 398 394   < > = values in this interval not displayed.    Basic Metabolic Panel: Recent Labs  Lab 08/23/19 0515 08/24/19 0104 08/25/19 0559 08/26/19 0536 08/27/19 0526  NA 133* 135 135 133* 134*  K 3.7 3.3* 3.2* 3.0* 3.1*  CL 105 106 103 103 100  CO2 19* 22 24 24 23   GLUCOSE 237* 178* 191* 182* 152*  BUN 27* 26* 24* 21* 24*  CREATININE 2.05* 1.97* 1.57* 1.36* 1.48*  CALCIUM 8.0* 8.5* 8.2* 8.1* 8.4*  MG 1.7 1.5* 1.3* 1.6* 1.6*  PHOS 4.1 3.5 2.8 2.9 3.8    GFR: Estimated Creatinine Clearance: 65.4 mL/min (Jodine Muchmore) (by C-G formula based on SCr of 1.48 mg/dL (H)).  Liver Function Tests: Recent Labs  Lab 08/23/19 0515 08/24/19 0104 08/25/19 0559 08/26/19 0536 08/27/19 0526  AST 9* 9* 15 12* 11*  ALT 11 12 13 14 13   ALKPHOS 88 91 87 82 86  BILITOT 0.2* 0.5 0.6 0.4 0.5  PROT 6.0* 6.9 6.7 6.7 6.8  ALBUMIN 2.5* 2.8*  2.7* 2.7* 2.9*    CBG: Recent Labs  Lab 08/26/19 1234 08/26/19 1732 08/26/19 2045 08/27/19 0741 08/27/19 1135  GLUCAP 143* 318* 265* 129* 110*     Recent Results (from the past 240 hour(s))  SARS Coronavirus 2 by RT PCR (hospital order, performed in [facility] hospital lab) Nasopharyngeal Nasopharyngeal Swab     Status: None   Collection Time: 08/19/19  7:23 PM   Specimen: Nasopharyngeal Swab  Result Value Ref Range Status   SARS Coronavirus 2 NEGATIVE NEGATIVE Final    Comment: (NOTE) SARS-CoV-2 target nucleic acids are NOT DETECTED.  The SARS-CoV-2 RNA is generally detectable in upper and lower respiratory specimens during the acute phase of infection. The lowest concentration of SARS-CoV-2 viral copies this assay can detect is 250 copies / mL. Shaylene Paganelli negative result does not preclude SARS-CoV-2 infection and should not be used as the sole basis for treatment or other patient management decisions.  Ikea Demicco negative result may occur with improper specimen collection / handling, submission of specimen other than nasopharyngeal swab, presence of viral mutation(s) within the areas targeted by this assay, and inadequate number of viral copies (<250 copies / mL). Croix Presley negative result must be combined with clinical observations, patient history, and epidemiological information.  Fact Sheet for Patients:   StrictlyIdeas.no  Fact Sheet for Healthcare Providers: BankingDealers.co.za  This test is not yet approved or  cleared by the [place] FDA and has been authorized for detection and/or diagnosis of SARS-CoV-2 by FDA under an Emergency Use Authorization (EUA).  This EUA will remain in effect (meaning this test can be used) for the duration of the COVID-19 declaration under Section 564(b)(1) of the Act, 21 U.S.C. section 360bbb-3(b)(1), unless the authorization is terminated or revoked sooner.  Performed at [facility], [place] [address], [place],  [address]          Radiology Studies: No results found.      Scheduled Meds: . apixaban  5 mg Oral BID  . atorvastatin  10 mg Oral Daily  . DULoxetine  40 mg Oral Daily  . ferrous sulfate  325 mg Oral Q breakfast  . furosemide  60 mg Intravenous BID  .  insulin aspart  0-20 Units Subcutaneous TID PC & HS  . insulin aspart  4 Units Subcutaneous TID WC  . insulin glargine  30 Units Subcutaneous QHS  . isosorbide mononitrate  30 mg Oral Daily  . loratadine  10 mg Oral Daily  . melatonin  6 mg Oral QHS  . metoprolol succinate  25 mg Oral Daily  . pantoprazole  40 mg Oral BID   Continuous Infusions: . sodium chloride 250 mL (08/23/19 2359)  . erythromycin Stopped (08/27/19 0530)     LOS: 7 days    Time spent: over 30 min    Fayrene Helper, MD [facility]   To contact the attending provider between 7A-7P or the covering provider during after hours 7P-7A, please log into the web site [email or web address] and access using universal [place] password for that web site. If you do not have the password, please call the hospital operator.  08/27/2019, 2:24 PM

## 2019-08-27 NOTE — Progress Notes (Signed)
Pt ate 100% of breakfast tray and drank approximately 240 cc's of water. Tolerated well. No complaints of nausea and vomiting.

## 2019-08-27 NOTE — Care Management Important Message (Signed)
Important Message  Patient Details IM Letter given to Dessa Phi RN Case Manager to present to the Patient Name: Deborah [name] MRN: [id number] Date of Birth: November 28, 1965   Medicare Important Message Given:  Yes     Kerin Salen 08/27/2019, 11:14 AM

## 2019-08-27 NOTE — Progress Notes (Signed)
Pt tolerated lunch well. Ate 100% of meal and drank 360 ml's liquid. No complaints of nausea or vomiting. Pt does complain that PO Dilaudid did not help her pain. Dr. Florene Glen made aware. Will continue to monitor.

## 2019-08-27 NOTE — Plan of Care (Signed)
  Problem: Health Behavior/Discharge Planning: Goal: Ability to manage health-related needs will improve 08/27/2019 2240 by Nelia Shi, RN Outcome: Progressing 08/27/2019 2239 by Nelia Shi, RN Outcome: Progressing

## 2019-08-28 LAB — COMPREHENSIVE METABOLIC PANEL
ALT: 14 U/L (ref 0–44)
AST: 11 U/L — ABNORMAL LOW (ref 15–41)
Albumin: 2.7 g/dL — ABNORMAL LOW (ref 3.5–5.0)
Alkaline Phosphatase: 95 U/L (ref 38–126)
Anion gap: 9 (ref 5–15)
BUN: 24 mg/dL — ABNORMAL HIGH (ref 6–20)
CO2: 23 mmol/L (ref 22–32)
Calcium: 8.3 mg/dL — ABNORMAL LOW (ref 8.9–10.3)
Chloride: 101 mmol/L (ref 98–111)
Creatinine, Ser: 1.5 mg/dL — ABNORMAL HIGH (ref 0.44–1.00)
GFR calc Af Amer: 45 mL/min — ABNORMAL LOW (ref >60)
GFR calc non Af Amer: 39 mL/min — ABNORMAL LOW (ref >60)
Glucose, Bld: 272 mg/dL — ABNORMAL HIGH (ref 70–99)
Potassium: 3.3 mmol/L — ABNORMAL LOW (ref 3.5–5.1)
Sodium: 133 mmol/L — ABNORMAL LOW (ref 135–145)
Total Bilirubin: 0.5 mg/dL (ref 0.3–1.2)
Total Protein: 6.5 g/dL (ref 6.5–8.1)

## 2019-08-28 LAB — CBC WITH DIFFERENTIAL/PLATELET
Abs Immature Granulocytes: 0.03 10*3/uL (ref 0.00–0.07)
Basophils Absolute: 0.1 10*3/uL (ref 0.0–0.1)
Basophils Relative: 1 %
Eosinophils Absolute: 0.3 10*3/uL (ref 0.0–0.5)
Eosinophils Relative: 3 %
HCT: 25.8 % — ABNORMAL LOW (ref 36.0–46.0)
Hemoglobin: 8 g/dL — ABNORMAL LOW (ref 12.0–15.0)
Immature Granulocytes: 0 %
Lymphocytes Relative: 24 %
Lymphs Abs: 2.4 10*3/uL (ref 0.7–4.0)
MCH: 26.9 pg (ref 26.0–34.0)
MCHC: 31 g/dL (ref 30.0–36.0)
MCV: 86.9 fL (ref 80.0–100.0)
Monocytes Absolute: 0.7 10*3/uL (ref 0.1–1.0)
Monocytes Relative: 7 %
Neutro Abs: 6.6 10*3/uL (ref 1.7–7.7)
Neutrophils Relative %: 65 %
Platelets: 402 10*3/uL — ABNORMAL HIGH (ref 150–400)
RBC: 2.97 MIL/uL — ABNORMAL LOW (ref 3.87–5.11)
RDW: 14.3 % (ref 11.5–15.5)
WBC: 10.1 10*3/uL (ref 4.0–10.5)
nRBC: 0 % (ref 0.0–0.2)

## 2019-08-28 LAB — PHOSPHORUS: Phosphorus: 3.3 mg/dL (ref 2.5–4.6)

## 2019-08-28 LAB — GLUCOSE, CAPILLARY
Glucose-Capillary: 340 mg/dL — ABNORMAL HIGH (ref 70–99)
Glucose-Capillary: 235 mg/dL — ABNORMAL HIGH (ref 70–99)
Glucose-Capillary: 179 mg/dL — ABNORMAL HIGH (ref 70–99)
Glucose-Capillary: 281 mg/dL — ABNORMAL HIGH (ref 70–99)
Glucose-Capillary: 278 mg/dL — ABNORMAL HIGH (ref 70–99)

## 2019-08-28 LAB — MAGNESIUM: Magnesium: 1.7 mg/dL (ref 1.7–2.4)

## 2019-08-28 MED ORDER — FUROSEMIDE 10 MG/ML IJ SOLN
40.0000 mg | Freq: Once | INTRAMUSCULAR | Status: AC
  Administered 2019-08-28: 40 mg via INTRAVENOUS
  Filled 2019-08-28: qty 4

## 2019-08-28 MED ORDER — ERYTHROMYCIN BASE 250 MG PO TABS
250.0000 mg | ORAL_TABLET | Freq: Three times a day (TID) | ORAL | Status: DC
  Administered 2019-08-29 – 2019-08-31 (×8): 250 mg via ORAL
  Filled 2019-08-28 (×9): qty 1

## 2019-08-28 MED ORDER — FUROSEMIDE 10 MG/ML IJ SOLN
60.0000 mg | Freq: Two times a day (BID) | INTRAMUSCULAR | Status: DC
  Administered 2019-08-28 – 2019-08-30 (×5): 60 mg via INTRAVENOUS
  Filled 2019-08-28 (×5): qty 6

## 2019-08-28 MED ORDER — POTASSIUM CHLORIDE CRYS ER 20 MEQ PO TBCR
40.0000 meq | EXTENDED_RELEASE_TABLET | Freq: Once | ORAL | Status: AC
  Administered 2019-08-28: 40 meq via ORAL
  Filled 2019-08-28: qty 2

## 2019-08-28 MED ORDER — FUROSEMIDE 10 MG/ML IJ SOLN
20.0000 mg | Freq: Once | INTRAMUSCULAR | Status: AC
  Administered 2019-08-28: 20 mg via INTRAVENOUS
  Filled 2019-08-28: qty 2

## 2019-08-28 NOTE — Progress Notes (Deleted)
Pt c/o "hot flashes" and shortness of breath. HR on tele 120's-130's in a fib. Dr. Florene Glen made aware. EKG complete. IV Metoprolol 2.5 mg given x2. HR now 90-100's. Dr. Florene Glen made aware. Will continue to monitor.

## 2019-08-28 NOTE — Plan of Care (Signed)
  Problem: Health Behavior/Discharge Planning: Goal: Ability to manage health-related needs will improve Outcome: Progressing   Problem: Clinical Measurements: Goal: Ability to maintain clinical measurements within normal limits will improve Outcome: Progressing Goal: Will remain free from infection Outcome: Progressing Goal: Diagnostic test results will improve Outcome: Progressing Goal: Cardiovascular complication will be avoided Outcome: Progressing   Problem: Activity: Goal: Risk for activity intolerance will decrease Outcome: Progressing   Problem: Nutrition: Goal: Adequate nutrition will be maintained Outcome: Progressing   Problem: Coping: Goal: Level of anxiety will decrease Outcome: Progressing   Problem: Pain Managment: Goal: General experience of comfort will improve Outcome: Progressing   Problem: Safety: Goal: Ability to remain free from injury will improve Outcome: Progressing   Problem: Skin Integrity: Goal: Risk for impaired skin integrity will decrease Outcome: Progressing

## 2019-08-28 NOTE — Progress Notes (Addendum)
PROGRESS NOTE    Deborah [name]  OVF:[id number] DOB: 06/16/1965 DOA: 08/19/2019 PCP: [name], DO   Chief Complaint  Patient presents with  . [name]  . Chest Pain  . Emesis    Brief Narrative:  [name] y.o.African-American femalewith Deborah [name] known history of type II obese mellitus, chronic systolic CHF, hypertension, dyslipidemia and GERD as well as gastroparesis, presented to the emergency room with acute onset of intractable vomiting with associated epigastric and lower abdominal pain. Denies hematemesis or malena. Also had chest pain felt as pressure and graded 5/10 in severity with radiation to her left arm and associated mild dyspnea without complications.   Upon presentation to the emergency room, blood pressure was 153/105 with heart rate of 115 and RR 24 and temperature 98. Labs revealed hypokalemia of 3.1 glucose of 218 with Deborah [name] BUN of 30 and creatinine 1.61 compared to 30/1.28 08/07/2019. Serum lipase was 34 and BNP 60.8. High-sensitivity troponin I was 26 and later 21. CBC showed minimal leukocytosis of 11.7 with neutrophilia and anemia better than previous levels. Chest x-ray showed cardiomegaly without acute cardiopulmonary disease.EKG showed sinus tachycardia with Deborah [name] rate of 116 with left axis deviation poor R wave progression.  The patient was given Dilaudid 1 mg IV twice as well as 4 mg of IV Zofran and 1 L bolus of IV normal saline. She was still having recurrent epigastric pain and nausea.   She presented with nausea/vomiting and abdominal pain and was started on treatment for her gastroparesis.  Hospitalization was c/b volume overload and she's currently receiving IV lasix.    Assessment & Plan:   Active Problems:   Diabetic gastroparesis ([place])  1. Diabetic gastroparesis with intractable epigastric pain as well as recurrent nausea and vomiting. - Continue hydration with IV normal saline. - Continue as needed IV Zofran and  Phenergan. Apparently she has not tolerated Reglan in the past. It apparently gave her extraparametal symptoms and tachycardia.   - trial of erythromycin -> PO erythromycin - no more IV dilaudid -> she can tolerate PO meds (unfortunately, has allergies or intolerances to most alternative meds) - po dilaudid  -Continue Protonix. - CT from 5/28 with 15 mm metallic density in region of duodenal bulb - consider repeat imaging if persistent pain - repeat CT scan without acute abnormality  - reports don't always line up with nursing.  Will request nursing to document how's she's doing with diet/nausea/vomiting.  She said she didn't eat much yesterday with nausea.  Hopefully working towards d/c soon with erythromycin.  # Volume Overload  LE edema  Combined Systolic and Diastolic HF:  Primary issue at this time.  Weight is up from admission weight of 325 lbs (147.6 kg).  She told me her dry weight was around 319 (told nursing today it was around 310- unclear which is accurate).   Pain and swelling to bilateral LE's. Echo from 5/17 with EF 45-50%, global hypokinesis (see report) 147 kg at presentation, up to 155.5 kg on 3/16 149.7 kg 6/22 I/O, daily weights - downtrending, will continue to monitor  # Atrial Fibrillation with RVR: improved.  Continue metoprolol.  Continue eliquis.  # Anemia  Iron Def Anemia: Hb stable at 7.7 today.  Positive FOBT, but no obvious blood in stool per discussion with RN.  Continue to trend H/H.  Vitals stable. Labs suggestive of iron def anemia GI c/s, recommending holding eliquis, PPI, appreciate assistance - no endoscopic intervention planned.  Will restart eliquis.  #  Carcinoid Tumor: following with Dr. Irene Limbo (see 08/08/19 note) - follow outpatient   2. Hypokalemia/hypomagnesemia. - replace and follow  3. Acute kidney injury. -creatinine 1.50 today, improved, stable, baseline ~1.3-1.8 - currently being diuresed in setting of LE edema - follow UP/C (1.69) - UA  with RBC's, WBC's and proteinuria - follow outpatient - per renal, ? New baseline - needs to follow outpatient   4. Chest pain, likely atypical. - troponin flat, low  - suspect this is related to epigastric pain - consider additional w/u if worsening or concerning symptoms  5.Type 2 diabetes mellitus. -Continue lantus 30 units daily (lower dose) -Consulted diabetes coordinator.  6. Hypertension. -On Imdur, Toprol-XL which is supposed to be her home medications.  Hydral on hold.  Torsemide resumed.  7. Dyslipidemia. -We will continue statin therapy.  8. Anxiety. -Continue Xanax.  9.COPD without exacerbation. -Continue her nebulizer therapy.  DVT prophylaxis: eliquis Code Status: full  Family Communication: none at bedside Disposition:   Status is: Inpatient  Remains inpatient appropriate because:Inpatient level of care appropriate due to severity of illness   Dispo: The patient is from: Home              Anticipated d/c is to: Home              Anticipated d/c date is: 2 days              Patient currently is not medically stable to d/c.   Consultants:   nephrology  Procedures:  LE [place] Summary:  RIGHT:  - Findings appear essentially unchanged compared to previous examination.  - There is no evidence of deep vein thrombosis in the lower extremity.  However, portions of this examination were limited- see technologist  comments above.    LEFT:  - Findings appear essentially unchanged compared to previous examination.  - There is no evidence of deep vein thrombosis in the lower extremity.  However, portions of this examination were limited- see technologist  comments above.   Antimicrobials:  Anti-infectives (From admission, onward)   Start     Dose/Rate Route Frequency Ordered Stop   08/23/19 1300  erythromycin 250 mg in sodium chloride 0.9 % 100 mL IVPB     Discontinue     250 mg 100 mL/hr over 60 Minutes Intravenous Every 8 hours 08/23/19 1152        Subjective: Asking to increase her pain medicine Swelling persistent  Objective: Vitals:   08/27/19 2228 08/28/19 0426 08/28/19 1140 08/28/19 1333  BP: (!) 144/94 (!) 130/110  131/60  Pulse: 96 74  97  Resp: 20 20  17   Temp: (!) 97.3 F (36.3 C) 97.8 F (36.6 C)  97.7 F (36.5 C)  TempSrc: Oral Oral  Oral  SpO2: 98% 98%  100%  Weight:   (!) 149.7 kg   Height:        Intake/Output Summary (Last 24 hours) at 08/28/2019 1733 Last data filed at 08/28/2019 1400 Gross per 24 hour  Intake 1260.03 ml  Output 1000 ml  Net 260.03 ml   Filed Weights   08/23/19 0553 08/26/19 0500 08/28/19 1140  Weight: (!) 154.8 kg (!) 152 kg (!) 149.7 kg    Examination:  General: No acute distress. Cardiovascular: Heart sounds show Davison Ohms regular rate, and rhythm Lungs: Clear to auscultation bilaterally Abdomen: Soft, nontender, nondistended  Neurological: Alert and oriented 3. Moves all extremities 4. Cranial nerves II through XII grossly intact. Skin: Warm and dry. No rashes or  lesions. Extremities: bilateral LE edema   Data Reviewed: I have personally reviewed following labs and imaging studies  CBC: Recent Labs  Lab 08/24/19 0104 08/25/19 0559 08/26/19 0800 08/27/19 0526 08/28/19 0528  WBC 11.0* 10.1 10.8* 9.9 10.1  NEUTROABS 8.3* 7.5 7.8* 6.9 6.6  HGB 8.1* 7.7* 7.6* 8.1* 8.0*  HCT 25.9* 24.9* 24.4* 26.3* 25.8*  MCV 88.1 86.8 86.8 86.2 86.9  PLT 288 372 398 394 402*    Basic Metabolic Panel: Recent Labs  Lab 08/24/19 0104 08/25/19 0559 08/26/19 0536 08/27/19 0526 08/28/19 0528  NA 135 135 133* 134* 133*  K 3.3* 3.2* 3.0* 3.1* 3.3*  CL 106 103 103 100 101  CO2 22 24 24 23 23   GLUCOSE 178* 191* 182* 152* 272*  BUN 26* 24* 21* 24* 24*  CREATININE 1.97* 1.57* 1.36* 1.48* 1.50*  CALCIUM 8.5* 8.2* 8.1* 8.4* 8.3*  MG 1.5* 1.3* 1.6* 1.6* 1.7  PHOS 3.5 2.8 2.9 3.8 3.3    GFR: Estimated Creatinine Clearance: 63.9 mL/min (Marieli Rudy) (by C-G formula based on SCr of 1.5 mg/dL  (H)).  Liver Function Tests: Recent Labs  Lab 08/24/19 0104 08/25/19 0559 08/26/19 0536 08/27/19 0526 08/28/19 0528  AST 9* 15 12* 11* 11*  ALT 12 13 14 13 14   ALKPHOS 91 87 82 86 95  BILITOT 0.5 0.6 0.4 0.5 0.5  PROT 6.9 6.7 6.7 6.8 6.5  ALBUMIN 2.8* 2.7* 2.7* 2.9* 2.7*    CBG: Recent Labs  Lab 08/27/19 1529 08/27/19 2226 08/28/19 0725 08/28/19 1147 08/28/19 1613  GLUCAP 305* 340* 235* 179* 281*     Recent Results (from the past 240 hour(s))  SARS Coronavirus 2 by RT PCR (hospital order, performed in [facility] hospital lab) Nasopharyngeal Nasopharyngeal Swab     Status: None   Collection Time: 08/19/19  7:23 PM   Specimen: Nasopharyngeal Swab  Result Value Ref Range Status   SARS Coronavirus 2 NEGATIVE NEGATIVE Final    Comment: (NOTE) SARS-CoV-2 target nucleic acids are NOT DETECTED.  The SARS-CoV-2 RNA is generally detectable in upper and lower respiratory specimens during the acute phase of infection. The lowest concentration of SARS-CoV-2 viral copies this assay can detect is 250 copies / mL. Sherby Moncayo negative result does not preclude SARS-CoV-2 infection and should not be used as the sole basis for treatment or other patient management decisions.  Damauri Minion negative result may occur with improper specimen collection / handling, submission of specimen other than nasopharyngeal swab, presence of viral mutation(s) within the areas targeted by this assay, and inadequate number of viral copies (<250 copies / mL). Alexiya Franqui negative result must be combined with clinical observations, patient history, and epidemiological information.  Fact Sheet for Patients:   StrictlyIdeas.no  Fact Sheet for Healthcare Providers: BankingDealers.co.za  This test is not yet approved or  cleared by the [place] FDA and has been authorized for detection and/or diagnosis of SARS-CoV-2 by FDA under an Emergency Use Authorization (EUA).  This EUA will  remain in effect (meaning this test can be used) for the duration of the COVID-19 declaration under Section 564(b)(1) of the Act, 21 U.S.C. section 360bbb-3(b)(1), unless the authorization is terminated or revoked sooner.  Performed at [facility], [place] [address], [place], [place] [address]          Radiology Studies: No results found.      Scheduled Meds: . apixaban  5 mg Oral BID  . atorvastatin  10 mg Oral Daily  . DULoxetine  40 mg Oral Daily  . ferrous sulfate  325 mg Oral Q breakfast  . furosemide  60 mg Intravenous BID  . insulin aspart  0-20 Units Subcutaneous TID PC & HS  . insulin aspart  4 Units Subcutaneous TID WC  . insulin glargine  30 Units Subcutaneous QHS  . isosorbide mononitrate  30 mg Oral Daily  . loratadine  10 mg Oral Daily  . melatonin  6 mg Oral QHS  . metoprolol succinate  25 mg Oral Daily  . pantoprazole  40 mg Oral BID   Continuous Infusions: . sodium chloride 250 mL (08/23/19 2359)  . erythromycin 250 mg (08/28/19 1413)     LOS: 8 days    Time spent: over 30 min    Fayrene Helper, MD [facility]   To contact the attending provider between 7A-7P or the covering provider during after hours 7P-7A, please log into the web site [email or web address] and access using universal [place] password for that web site. If you do not have the password, please call the hospital operator.  08/28/2019, 5:33 PM

## 2019-08-28 NOTE — Progress Notes (Signed)
Pt ate approximately 75% of lunch tray and drank 240 mls of water. Tolerated well. No complaints of nausea/vomiting at this time.

## 2019-08-29 DIAGNOSIS — I10 Essential (primary) hypertension: Secondary | ICD-10-CM

## 2019-08-29 LAB — GLUCOSE, CAPILLARY
Glucose-Capillary: 183 mg/dL — ABNORMAL HIGH (ref 70–99)
Glucose-Capillary: 231 mg/dL — ABNORMAL HIGH (ref 70–99)
Glucose-Capillary: 327 mg/dL — ABNORMAL HIGH (ref 70–99)
Glucose-Capillary: 340 mg/dL — ABNORMAL HIGH (ref 70–99)

## 2019-08-29 LAB — COMPREHENSIVE METABOLIC PANEL
ALT: 13 U/L (ref 0–44)
AST: 12 U/L — ABNORMAL LOW (ref 15–41)
Albumin: 2.6 g/dL — ABNORMAL LOW (ref 3.5–5.0)
Alkaline Phosphatase: 87 U/L (ref 38–126)
Anion gap: 12 (ref 5–15)
BUN: 24 mg/dL — ABNORMAL HIGH (ref 6–20)
CO2: 26 mmol/L (ref 22–32)
Calcium: 8.4 mg/dL — ABNORMAL LOW (ref 8.9–10.3)
Chloride: 99 mmol/L (ref 98–111)
Creatinine, Ser: 1.42 mg/dL — ABNORMAL HIGH (ref 0.44–1.00)
GFR calc Af Amer: 48 mL/min — ABNORMAL LOW (ref >60)
GFR calc non Af Amer: 41 mL/min — ABNORMAL LOW (ref >60)
Glucose, Bld: 211 mg/dL — ABNORMAL HIGH (ref 70–99)
Potassium: 3.2 mmol/L — ABNORMAL LOW (ref 3.5–5.1)
Sodium: 137 mmol/L (ref 135–145)
Total Bilirubin: 0.5 mg/dL (ref 0.3–1.2)
Total Protein: 6.6 g/dL (ref 6.5–8.1)

## 2019-08-29 LAB — CBC WITH DIFFERENTIAL/PLATELET
Abs Immature Granulocytes: 0.02 10*3/uL (ref 0.00–0.07)
Basophils Absolute: 0 10*3/uL (ref 0.0–0.1)
Basophils Relative: 0 %
Eosinophils Absolute: 0.4 10*3/uL (ref 0.0–0.5)
Eosinophils Relative: 3 %
HCT: 26.2 % — ABNORMAL LOW (ref 36.0–46.0)
Hemoglobin: 8.2 g/dL — ABNORMAL LOW (ref 12.0–15.0)
Immature Granulocytes: 0 %
Lymphocytes Relative: 20 %
Lymphs Abs: 2.2 10*3/uL (ref 0.7–4.0)
MCH: 27 pg (ref 26.0–34.0)
MCHC: 31.3 g/dL (ref 30.0–36.0)
MCV: 86.2 fL (ref 80.0–100.0)
Monocytes Absolute: 0.7 10*3/uL (ref 0.1–1.0)
Monocytes Relative: 6 %
Neutro Abs: 7.8 10*3/uL — ABNORMAL HIGH (ref 1.7–7.7)
Neutrophils Relative %: 71 %
Platelets: 433 10*3/uL — ABNORMAL HIGH (ref 150–400)
RBC: 3.04 MIL/uL — ABNORMAL LOW (ref 3.87–5.11)
RDW: 14.3 % (ref 11.5–15.5)
WBC: 11.1 10*3/uL — ABNORMAL HIGH (ref 4.0–10.5)
nRBC: 0 % (ref 0.0–0.2)

## 2019-08-29 LAB — MAGNESIUM: Magnesium: 1.5 mg/dL — ABNORMAL LOW (ref 1.7–2.4)

## 2019-08-29 LAB — PHOSPHORUS: Phosphorus: 3.7 mg/dL (ref 2.5–4.6)

## 2019-08-29 MED ORDER — HYDROMORPHONE HCL 1 MG/ML IJ SOLN
0.5000 mg | Freq: Once | INTRAMUSCULAR | Status: AC
  Administered 2019-08-29: 0.5 mg via INTRAVENOUS
  Filled 2019-08-29: qty 0.5

## 2019-08-29 MED ORDER — INSULIN GLARGINE 100 UNIT/ML ~~LOC~~ SOLN
45.0000 [IU] | Freq: Every day | SUBCUTANEOUS | Status: DC
  Administered 2019-08-29 – 2019-08-30 (×2): 45 [IU] via SUBCUTANEOUS
  Filled 2019-08-29 (×2): qty 0.45

## 2019-08-29 MED ORDER — MAGNESIUM SULFATE 2 GM/50ML IV SOLN
2.0000 g | Freq: Once | INTRAVENOUS | Status: AC
  Administered 2019-08-29: 2 g via INTRAVENOUS
  Filled 2019-08-29: qty 50

## 2019-08-29 MED ORDER — POTASSIUM CHLORIDE CRYS ER 20 MEQ PO TBCR
40.0000 meq | EXTENDED_RELEASE_TABLET | Freq: Once | ORAL | Status: AC
  Administered 2019-08-29: 40 meq via ORAL
  Filled 2019-08-29: qty 2

## 2019-08-29 NOTE — Progress Notes (Signed)
Pt with no nausea or vomiting during the night, had 254mL cranberry juice over ice before bed with no issues.  Per RN request, NA attempted to get a standing weight this morning and pt refused to get on the scale, saying her foot hurt, without specifying which foot hurt

## 2019-08-29 NOTE — Progress Notes (Addendum)
PROGRESS NOTE    Deborah [name]  BVQ:[id number] DOB: 01-Nov-1965 DOA: 08/19/2019 PCP: [name], DO   Chief Complaint  Patient presents with  . [name]  . Chest Pain  . Emesis    Brief Narrative:  [name] y.o.African-American femalewith a known history of type II obese mellitus, chronic systolic CHF, hypertension, dyslipidemia and GERD as well as gastroparesis, presented to the emergency room with acute onset of intractable vomiting with associated epigastric and lower abdominal pain. Denies hematemesis or malena. Also had chest pain felt as pressure and graded 5/10 in severity with radiation to her left arm and associated mild dyspnea without complications. In the ED, blood pressure was 153/105 with heart rate of 115 and RR 24 and temperature 98. Labs revealed hypokalemia of 3.1 glucose of 218 with a BUN of 30 and creatinine 1.61 compared to 30/1.28 on 08/07/2019. Serum lipase was 34 and BNP 60.8. High-sensitivity troponin I was 26 and later 21. CBC showed minimal leukocytosis of 11.7 with neutrophilia and anemia better than previous levels. Chest x-ray showed cardiomegaly without acute cardiopulmonary disease.EKG showed sinus tachycardia with a rate of 116 with left axis deviation poor R wave progression. Pt admitted for further management.    Assessment & Plan:   Active Problems:   Diabetic gastroparesis ([place])  Diabetic gastroparesis with intractable epigastric pain as well as recurrent nausea and vomiting Currently afebrile, with some leukocytosis CT scan unremarkable Continue as needed IV Zofran and Phenergan, cannot take reglan due to extrapyramidal side effects Continue trial of PO erythromycin, p.o. Dilaudid for pain Continue Protonix  Volume Overload  LE edema  Combined Systolic and Diastolic HF Noted bilateral lower extremity swelling Echo from 5/17 with EF 45-50%, global hypokinesis Continue IV Lasix Strict I's and O's  I/O, daily weights  Paroxysmal Atrial Fibrillation HR controlled Continue metoprolol, eliquis  Normocytic anemia  Iron Def Anemia Hb stable Positive FOBT Labs suggestive of iron def anemia GI consulted, no endoscopic intervention planned Continue Eliquis  Carcinoid Tumor Following with Dr. Irene Limbo outpatient   Hypokalemia/hypomagnesemia Replace as needed  Acute kidney injury Baseline ~1.3-1.8 Currently being diuresed in setting of LE edema UA with RBC's, WBC's and proteinuria - follow outpatient Per renal, ? New baseline - needs to follow outpatient   Chest pain, likely atypical Troponin flat, low  Consider additional w/u if worsening or concerning symptoms  Type 2 diabetes mellitus A1c on 07/22/2019 was 8.7, uncontrolled Continue lantus, SSI, Accu-Cheks, hypoglycemic protocol  Hypertension On Imdur, Toprol-XL Home hydralazine, torsemide on hold   Dyslipidemia Continue statins  Anxiety Continue Xanax.  COPD without exacerbation Continue nebulizer therapy  Morbid obesity BMI greater than 50 Lifestyle modification advised    DVT prophylaxis: eliquis Code Status: full  Family Communication: none at bedside Disposition:   Status is: Inpatient  Remains inpatient appropriate because:Inpatient level of care appropriate due to severity of illness   Dispo: The patient is from: Home              Anticipated d/c is to: Home              Anticipated d/c date is: 1 day              Patient currently is not medically stable to d/c.   Consultants:   nephrology  Procedures:  LE [place] Summary:  RIGHT:  - Findings appear essentially unchanged compared to previous examination.  - There is no evidence of deep vein thrombosis in the lower extremity.  However, portions of this examination were limited- see technologist  comments above.    LEFT:  - Findings appear essentially unchanged compared to previous examination.  - There is no evidence of deep vein  thrombosis in the lower extremity.  However, portions of this examination were limited- see technologist  comments above.   Antimicrobials:  Anti-infectives (From admission, onward)   Start     Dose/Rate Route Frequency Ordered Stop   08/29/19 0800  erythromycin (E-MYCIN) tablet 250 mg     Discontinue     250 mg Oral 3 times daily with meals 08/28/19 1738     08/23/19 1300  erythromycin 250 mg in sodium chloride 0.9 % 100 mL IVPB  Status:  Discontinued        250 mg 100 mL/hr over 60 Minutes Intravenous Every 8 hours 08/23/19 1152 08/28/19 1738     Subjective: Persistent lower extremity edema, continues to complain of significant abdominal pain requesting IV Dilaudid, denies any further nausea/vomiting, able to tolerate p.o.  Denies any chest pain, shortness of breath, fever/chills.  Objective: Vitals:   08/28/19 1333 08/28/19 2138 08/29/19 0432 08/29/19 1150  BP: 131/60 (!) 144/77 124/66 132/65  Pulse: 97 96 98 91  Resp: 17 16 18  (!) 21  Temp: 97.7 F (36.5 C) 97.8 F (36.6 C) 98.1 F (36.7 C) (!) 97.5 F (36.4 C)  TempSrc: Oral Oral Oral Oral  SpO2: 100% 100% 98% 100%  Weight:      Height:        Intake/Output Summary (Last 24 hours) at 08/29/2019 1831 Last data filed at 08/29/2019 1710 Gross per 24 hour  Intake 720 ml  Output 2100 ml  Net -1380 ml   Filed Weights   08/23/19 0553 08/26/19 0500 08/28/19 1140  Weight: (!) 154.8 kg (!) 152 kg (!) 149.7 kg    Examination:  General: NAD, oral tardive dyskinesia noted  Cardiovascular: S1, S2 present  Respiratory: CTAB  Abdomen: Soft, nontender, nondistended, bowel sounds present  Musculoskeletal: 2+ bilateral pedal edema noted  Skin: Normal  Psychiatry: Normal mood   Data Reviewed: I have personally reviewed following labs and imaging studies  CBC: Recent Labs  Lab 08/25/19 0559 08/26/19 0800 08/27/19 0526 08/28/19 0528 08/29/19 0622  WBC 10.1 10.8* 9.9 10.1 11.1*  NEUTROABS 7.5 7.8* 6.9 6.6 7.8*    HGB 7.7* 7.6* 8.1* 8.0* 8.2*  HCT 24.9* 24.4* 26.3* 25.8* 26.2*  MCV 86.8 86.8 86.2 86.9 86.2  PLT 372 398 394 402* 433*    Basic Metabolic Panel: Recent Labs  Lab 08/25/19 0559 08/26/19 0536 08/27/19 0526 08/28/19 0528 08/29/19 0622  NA 135 133* 134* 133* 137  K 3.2* 3.0* 3.1* 3.3* 3.2*  CL 103 103 100 101 99  CO2 24 24 23 23 26   GLUCOSE 191* 182* 152* 272* 211*  BUN 24* 21* 24* 24* 24*  CREATININE 1.57* 1.36* 1.48* 1.50* 1.42*  CALCIUM 8.2* 8.1* 8.4* 8.3* 8.4*  MG 1.3* 1.6* 1.6* 1.7 1.5*  PHOS 2.8 2.9 3.8 3.3 3.7    GFR: Estimated Creatinine Clearance: 67.5 mL/min (A) (by C-G formula based on SCr of 1.42 mg/dL (H)).  Liver Function Tests: Recent Labs  Lab 08/25/19 0559 08/26/19 0536 08/27/19 0526 08/28/19 0528 08/29/19 0622  AST 15 12* 11* 11* 12*  ALT 13 14 13 14 13   ALKPHOS 87 82 86 95 87  BILITOT 0.6 0.4 0.5 0.5 0.5  PROT 6.7 6.7 6.8 6.5 6.6  ALBUMIN 2.7* 2.7* 2.9* 2.7* 2.6*  CBG: Recent Labs  Lab 08/28/19 1613 08/28/19 2002 08/29/19 0730 08/29/19 1146 08/29/19 1644  GLUCAP 281* 278* 183* 231* 327*     Recent Results (from the past 240 hour(s))  SARS Coronavirus 2 by RT PCR (hospital order, performed in [facility] hospital lab) Nasopharyngeal Nasopharyngeal Swab     Status: None   Collection Time: 08/19/19  7:23 PM   Specimen: Nasopharyngeal Swab  Result Value Ref Range Status   SARS Coronavirus 2 NEGATIVE NEGATIVE Final    Comment: (NOTE) SARS-CoV-2 target nucleic acids are NOT DETECTED.  The SARS-CoV-2 RNA is generally detectable in upper and lower respiratory specimens during the acute phase of infection. The lowest concentration of SARS-CoV-2 viral copies this assay can detect is 250 copies / mL. A negative result does not preclude SARS-CoV-2 infection and should not be used as the sole basis for treatment or other patient management decisions.  A negative result may occur with improper specimen collection / handling, submission of  specimen other than nasopharyngeal swab, presence of viral mutation(s) within the areas targeted by this assay, and inadequate number of viral copies (<250 copies / mL). A negative result must be combined with clinical observations, patient history, and epidemiological information.  Fact Sheet for Patients:   StrictlyIdeas.no  Fact Sheet for Healthcare Providers: BankingDealers.co.za  This test is not yet approved or  cleared by the [place] FDA and has been authorized for detection and/or diagnosis of SARS-CoV-2 by FDA under an Emergency Use Authorization (EUA).  This EUA will remain in effect (meaning this test can be used) for the duration of the COVID-19 declaration under Section 564(b)(1) of the Act, 21 U.S.C. section 360bbb-3(b)(1), unless the authorization is terminated or revoked sooner.  Performed at [facility], [place] [address] [place], [place] [address]          Radiology Studies: No results found.      Scheduled Meds: . apixaban  5 mg Oral BID  . atorvastatin  10 mg Oral Daily  . DULoxetine  40 mg Oral Daily  . erythromycin  250 mg Oral TID WC  . ferrous sulfate  325 mg Oral Q breakfast  . furosemide  60 mg Intravenous BID  . insulin aspart  0-20 Units Subcutaneous TID PC & HS  . insulin aspart  4 Units Subcutaneous TID WC  . insulin glargine  30 Units Subcutaneous QHS  . isosorbide mononitrate  30 mg Oral Daily  . loratadine  10 mg Oral Daily  . melatonin  6 mg Oral QHS  . metoprolol succinate  25 mg Oral Daily  . pantoprazole  40 mg Oral BID   Continuous Infusions: . sodium chloride 250 mL (08/23/19 2359)     LOS: 9 days    Alma Friendly, MD [facility]  08/29/2019, 6:31 PM

## 2019-08-29 NOTE — Plan of Care (Signed)
  Problem: Health Behavior/Discharge Planning: Goal: Ability to manage health-related needs will improve Outcome: Progressing   Problem: Clinical Measurements: Goal: Ability to maintain clinical measurements within normal limits will improve Outcome: Progressing Goal: Will remain free from infection Outcome: Progressing Goal: Diagnostic test results will improve Outcome: Progressing Goal: Cardiovascular complication will be avoided Outcome: Progressing   Problem: Activity: Goal: Risk for activity intolerance will decrease Outcome: Progressing   Problem: Nutrition: Goal: Adequate nutrition will be maintained Outcome: Progressing   Problem: Coping: Goal: Level of anxiety will decrease Outcome: Progressing   Problem: Pain Managment: Goal: General experience of comfort will improve Outcome: Progressing   Problem: Safety: Goal: Ability to remain free from injury will improve Outcome: Progressing   Problem: Skin Integrity: Goal: Risk for impaired skin integrity will decrease Outcome: Progressing

## 2019-08-29 NOTE — Progress Notes (Signed)
Pt with no complaints of N/V on this shift. Ate 100% of breakfast and lunch. 27mL cranberry juice x2.

## 2019-08-29 NOTE — Progress Notes (Signed)
Expressed the need for her to comply with a standing daily wt in the morning.  Explained that is the most accurate way to get a weight and that we need to be sure the lasix she is getting has been effective.  Pt expressed understanding.    1949  Pt called out for PB and Saltine crackers.  No complaints of nausea or pain at this time

## 2019-08-30 LAB — CBC WITH DIFFERENTIAL/PLATELET
Abs Immature Granulocytes: 0.08 10*3/uL — ABNORMAL HIGH (ref 0.00–0.07)
Basophils Absolute: 0.1 10*3/uL (ref 0.0–0.1)
Basophils Relative: 0 %
Eosinophils Absolute: 0.4 10*3/uL (ref 0.0–0.5)
Eosinophils Relative: 3 %
HCT: 27.8 % — ABNORMAL LOW (ref 36.0–46.0)
Hemoglobin: 8.6 g/dL — ABNORMAL LOW (ref 12.0–15.0)
Immature Granulocytes: 1 %
Lymphocytes Relative: 18 %
Lymphs Abs: 2.5 10*3/uL (ref 0.7–4.0)
MCH: 26.8 pg (ref 26.0–34.0)
MCHC: 30.9 g/dL (ref 30.0–36.0)
MCV: 86.6 fL (ref 80.0–100.0)
Monocytes Absolute: 1 10*3/uL (ref 0.1–1.0)
Monocytes Relative: 8 %
Neutro Abs: 9.4 10*3/uL — ABNORMAL HIGH (ref 1.7–7.7)
Neutrophils Relative %: 70 %
Platelets: 424 10*3/uL — ABNORMAL HIGH (ref 150–400)
RBC: 3.21 MIL/uL — ABNORMAL LOW (ref 3.87–5.11)
RDW: 14.2 % (ref 11.5–15.5)
WBC: 13.4 10*3/uL — ABNORMAL HIGH (ref 4.0–10.5)
nRBC: 0 % (ref 0.0–0.2)

## 2019-08-30 LAB — BASIC METABOLIC PANEL
Anion gap: 9 (ref 5–15)
BUN: 24 mg/dL — ABNORMAL HIGH (ref 6–20)
CO2: 28 mmol/L (ref 22–32)
Calcium: 9 mg/dL (ref 8.9–10.3)
Chloride: 98 mmol/L (ref 98–111)
Creatinine, Ser: 1.49 mg/dL — ABNORMAL HIGH (ref 0.44–1.00)
GFR calc Af Amer: 45 mL/min — ABNORMAL LOW (ref >60)
GFR calc non Af Amer: 39 mL/min — ABNORMAL LOW (ref >60)
Glucose, Bld: 226 mg/dL — ABNORMAL HIGH (ref 70–99)
Potassium: 3.2 mmol/L — ABNORMAL LOW (ref 3.5–5.1)
Sodium: 135 mmol/L (ref 135–145)

## 2019-08-30 LAB — GLUCOSE, CAPILLARY
Glucose-Capillary: 231 mg/dL — ABNORMAL HIGH (ref 70–99)
Glucose-Capillary: 307 mg/dL — ABNORMAL HIGH (ref 70–99)
Glucose-Capillary: 325 mg/dL — ABNORMAL HIGH (ref 70–99)
Glucose-Capillary: 376 mg/dL — ABNORMAL HIGH (ref 70–99)

## 2019-08-30 MED ORDER — POTASSIUM CHLORIDE CRYS ER 20 MEQ PO TBCR
40.0000 meq | EXTENDED_RELEASE_TABLET | Freq: Once | ORAL | Status: AC
  Administered 2019-08-30: 40 meq via ORAL
  Filled 2019-08-30: qty 2

## 2019-08-30 MED ORDER — INSULIN ASPART 100 UNIT/ML ~~LOC~~ SOLN: 6.0000 [IU] | Freq: Three times a day (TID) | SUBCUTANEOUS | Status: DC

## 2019-08-30 MED ORDER — INSULIN ASPART 100 UNIT/ML ~~LOC~~ SOLN
6.0000 [IU] | Freq: Three times a day (TID) | SUBCUTANEOUS | Status: DC
  Administered 2019-08-30: 6 [IU] via SUBCUTANEOUS

## 2019-08-30 NOTE — Progress Notes (Signed)
PROGRESS NOTE    Deborah [name]  MKL:[id number] DOB: 07-27-1965 DOA: 08/19/2019 PCP: [name], DO   Chief Complaint  Patient presents with  . [name]  . Chest Pain  . Emesis    Brief Narrative:  Deborah [name] of type II obese mellitus, chronic systolic CHF, hypertension, dyslipidemia and GERD as well as gastroparesis, presented to the emergency room with acute onset of intractable vomiting with associated epigastric and lower abdominal pain. Denies hematemesis or malena. Also had chest pain felt as pressure and graded 5/10 in severity with radiation to her left arm and associated mild dyspnea without complications. In the ED, blood pressure was 153/105 with heart rate of 115 and RR 24 and temperature 98. Labs revealed hypokalemia of 3.1 glucose of 218 with a BUN of 30 and creatinine 1.61 compared to 30/1.28 on 08/07/2019. Serum lipase was 34 and BNP 60.8. High-sensitivity troponin I was 26 and later 21. CBC showed minimal leukocytosis of 11.7 with neutrophilia and anemia better than previous levels. Chest x-ray showed cardiomegaly without acute cardiopulmonary disease.EKG showed sinus tachycardia with a rate of 116 with left axis deviation poor R wave progression. Pt admitted for further management.    Assessment & Plan:   Active Problems:   Diabetic gastroparesis ([place])  Diabetic gastroparesis with intractable epigastric pain as well as recurrent nausea and vomiting Currently afebrile, with uptrending leukocytosis CT scan unremarkable Continue as needed IV Zofran and Phenergan, cannot take reglan due to extrapyramidal side effects Continue trial of PO erythromycin, p.o. Dilaudid for pain Continue Protonix  Volume Overload  LE edema  Combined Systolic and Diastolic HF Noted bilateral lower extremity swelling Echo from 5/17 with EF 45-50%, global hypokinesis Continue IV Lasix Strict I's and  O's I/O, daily weights  Paroxysmal Atrial Fibrillation HR controlled Continue metoprolol, eliquis  Normocytic anemia  Iron Def Anemia Hb stable Positive FOBT Labs suggestive of iron def anemia GI consulted, no endoscopic intervention planned Continue Eliquis  Carcinoid Tumor Following with Dr. Irene Limbo outpatient   Hypokalemia/hypomagnesemia Replace as needed  Acute kidney injury Baseline ~1.3-1.8 Currently being diuresed in setting of LE edema UA with RBC's, WBC's and proteinuria - follow outpatient Per renal, ? New baseline - needs to follow outpatient   Chest pain, likely atypical Troponin flat, low  Consider additional w/u if worsening or concerning symptoms  Type 2 diabetes mellitus A1c on 07/22/2019 was 8.7, uncontrolled Continue lantus, SSI, Accu-Cheks, hypoglycemic protocol  Hypertension On Imdur, Toprol-XL Home hydralazine, torsemide on hold   Dyslipidemia Continue statins  Anxiety Continue Xanax.  COPD without exacerbation Continue nebulizer therapy  Morbid obesity BMI greater than 50 Lifestyle modification advised    DVT prophylaxis: eliquis Code Status: full  Family Communication: none at bedside Disposition:   Status is: Inpatient  Remains inpatient appropriate because:Inpatient level of care appropriate due to severity of illness   Dispo: The patient is from: Home              Anticipated d/c is to: Home              Anticipated d/c date is: 1 day              Patient currently is not medically stable to d/c.   Consultants:   nephrology  Procedures:  LE [place] Summary:  RIGHT:  - Findings appear essentially unchanged compared to previous examination.  - There is no evidence of deep vein thrombosis in the lower extremity.  However, portions of this examination were limited- see technologist  comments above.    LEFT:  - Findings appear essentially unchanged compared to previous examination.  - There is no evidence of deep  vein thrombosis in the lower extremity.  However, portions of this examination were limited- see technologist  comments above.   Antimicrobials:  Anti-infectives (From admission, onward)   Start     Dose/Rate Route Frequency Ordered Stop   08/29/19 0800  erythromycin (E-MYCIN) tablet 250 mg     Discontinue     250 mg Oral 3 times daily with meals 08/28/19 1738     08/23/19 1300  erythromycin 250 mg in sodium chloride 0.9 % 100 mL IVPB  Status:  Discontinued        250 mg 100 mL/hr over 60 Minutes Intravenous Every 8 hours 08/23/19 1152 08/28/19 1738     Subjective: Patient currently complaining of bilateral lower extremity pain and edema.  Still with some abdominal pain, currently tolerating her diet.  Continues to ask for pain meds  Objective: Vitals:   08/30/19 0500 08/30/19 0620 08/30/19 1030 08/30/19 1211  BP:  116/72 (!) 141/83 (!) 147/89  Pulse:  100 (!) 105 95  Resp:  20  (!) 22  Temp:  97.6 F (36.4 C)  97.6 F (36.4 C)  TempSrc:    Oral  SpO2:  100% 100% 100%  Weight: (!) 152 kg     Height:        Intake/Output Summary (Last 24 hours) at 08/30/2019 1710 Last data filed at 08/30/2019 1649 Gross per 24 hour  Intake 960 ml  Output --  Net 960 ml   Filed Weights   08/26/19 0500 08/28/19 1140 08/30/19 0500  Weight: (!) 152 kg (!) 149.7 kg (!) 152 kg    Examination:  General: NAD, oral tardive dyskinesia noted  Cardiovascular: S1, S2 present  Respiratory: CTAB  Abdomen: Soft, nontender, nondistended, bowel sounds present  Musculoskeletal: 2+ bilateral pedal edema noted  Skin: Normal  Psychiatry: Normal mood   Data Reviewed: I have personally reviewed following labs and imaging studies  CBC: Recent Labs  Lab 08/26/19 0800 08/27/19 0526 08/28/19 0528 08/29/19 0622 08/30/19 0528  WBC 10.8* 9.9 10.1 11.1* 13.4*  NEUTROABS 7.8* 6.9 6.6 7.8* 9.4*  HGB 7.6* 8.1* 8.0* 8.2* 8.6*  HCT 24.4* 26.3* 25.8* 26.2* 27.8*  MCV 86.8 86.2 86.9 86.2 86.6  PLT  398 394 402* 433* 424*    Basic Metabolic Panel: Recent Labs  Lab 08/25/19 0559 08/25/19 0559 08/26/19 0536 08/27/19 0526 08/28/19 0528 08/29/19 0622 08/30/19 0528  NA 135   < > 133* 134* 133* 137 135  K 3.2*   < > 3.0* 3.1* 3.3* 3.2* 3.2*  CL 103   < > 103 100 101 99 98  CO2 24   < > 24 23 23 26 28   GLUCOSE 191*   < > 182* 152* 272* 211* 226*  BUN 24*   < > 21* 24* 24* 24* 24*  CREATININE 1.57*   < > 1.36* 1.48* 1.50* 1.42* 1.49*  CALCIUM 8.2*   < > 8.1* 8.4* 8.3* 8.4* 9.0  MG 1.3*  --  1.6* 1.6* 1.7 1.5*  --   PHOS 2.8  --  2.9 3.8 3.3 3.7  --    < > = values in this interval not displayed.    GFR: Estimated Creatinine Clearance: 64.9 mL/min (A) (by C-G formula based on SCr of 1.49 mg/dL (H)).  Liver Function  Tests: Recent Labs  Lab 08/25/19 0559 08/26/19 0536 08/27/19 0526 08/28/19 0528 08/29/19 0622  AST 15 12* 11* 11* 12*  ALT 13 14 13 14 13   ALKPHOS 87 82 86 95 87  BILITOT 0.6 0.4 0.5 0.5 0.5  PROT 6.7 6.7 6.8 6.5 6.6  ALBUMIN 2.7* 2.7* 2.9* 2.7* 2.6*    CBG: Recent Labs  Lab 08/29/19 1644 08/29/19 2143 08/30/19 0818 08/30/19 1206 08/30/19 1629  GLUCAP 327* 340* 231* 307* 325*     No results found for this or any previous visit (from the past 240 hour(s)).       Radiology Studies: No results found.      Scheduled Meds: . apixaban  5 mg Oral BID  . atorvastatin  10 mg Oral Daily  . DULoxetine  40 mg Oral Daily  . erythromycin  250 mg Oral TID WC  . ferrous sulfate  325 mg Oral Q breakfast  . furosemide  60 mg Intravenous BID  . insulin aspart  0-20 Units Subcutaneous TID PC & HS  . [START ON 08/31/2019] insulin aspart  6 Units Subcutaneous TID WC  . insulin glargine  45 Units Subcutaneous QHS  . isosorbide mononitrate  30 mg Oral Daily  . loratadine  10 mg Oral Daily  . melatonin  6 mg Oral QHS  . metoprolol succinate  25 mg Oral Daily  . pantoprazole  40 mg Oral BID   Continuous Infusions: . sodium chloride 250 mL (08/23/19  2359)     LOS: 10 days    Alma Friendly, MD [facility]  08/30/2019, 5:10 PM

## 2019-08-30 NOTE — Progress Notes (Signed)
Patient ate 100% of 3 meals today.  Tolerated well - no nausea/vomiting.  Patient bathed today and up to chair most of day.

## 2019-08-31 LAB — CBC WITH DIFFERENTIAL/PLATELET
Abs Immature Granulocytes: 0.06 10*3/uL (ref 0.00–0.07)
Basophils Absolute: 0 10*3/uL (ref 0.0–0.1)
Basophils Relative: 0 %
Eosinophils Absolute: 0.4 10*3/uL (ref 0.0–0.5)
Eosinophils Relative: 3 %
HCT: 27.4 % — ABNORMAL LOW (ref 36.0–46.0)
Hemoglobin: 8.5 g/dL — ABNORMAL LOW (ref 12.0–15.0)
Immature Granulocytes: 1 %
Lymphocytes Relative: 23 %
Lymphs Abs: 2.8 10*3/uL (ref 0.7–4.0)
MCH: 26.6 pg (ref 26.0–34.0)
MCHC: 31 g/dL (ref 30.0–36.0)
MCV: 85.6 fL (ref 80.0–100.0)
Monocytes Absolute: 0.9 10*3/uL (ref 0.1–1.0)
Monocytes Relative: 7 %
Neutro Abs: 8.3 10*3/uL — ABNORMAL HIGH (ref 1.7–7.7)
Neutrophils Relative %: 66 %
Platelets: 394 10*3/uL (ref 150–400)
RBC: 3.2 MIL/uL — ABNORMAL LOW (ref 3.87–5.11)
RDW: 14.1 % (ref 11.5–15.5)
WBC: 12.4 10*3/uL — ABNORMAL HIGH (ref 4.0–10.5)
nRBC: 0 % (ref 0.0–0.2)

## 2019-08-31 LAB — BASIC METABOLIC PANEL
Anion gap: 15 (ref 5–15)
BUN: 28 mg/dL — ABNORMAL HIGH (ref 6–20)
CO2: 23 mmol/L (ref 22–32)
Calcium: 9.2 mg/dL (ref 8.9–10.3)
Chloride: 99 mmol/L (ref 98–111)
Creatinine, Ser: 1.51 mg/dL — ABNORMAL HIGH (ref 0.44–1.00)
GFR calc Af Amer: 45 mL/min — ABNORMAL LOW (ref >60)
GFR calc non Af Amer: 39 mL/min — ABNORMAL LOW (ref >60)
Glucose, Bld: 224 mg/dL — ABNORMAL HIGH (ref 70–99)
Potassium: 3.3 mmol/L — ABNORMAL LOW (ref 3.5–5.1)
Sodium: 137 mmol/L (ref 135–145)

## 2019-08-31 LAB — GLUCOSE, CAPILLARY
Glucose-Capillary: 210 mg/dL — ABNORMAL HIGH (ref 70–99)
Glucose-Capillary: 168 mg/dL — ABNORMAL HIGH (ref 70–99)

## 2019-08-31 MED ORDER — ERYTHROMYCIN BASE 250 MG PO TABS: 250.0000 mg | ORAL_TABLET | Freq: Three times a day (TID) | ORAL | 0 refills | Status: AC

## 2019-08-31 MED ORDER — HYDROMORPHONE HCL 2 MG PO TABS: 1.0000 mg | ORAL_TABLET | Freq: Three times a day (TID) | ORAL | 0 refills | Status: DC | PRN

## 2019-08-31 MED ORDER — FERROUS SULFATE 325 (65 FE) MG PO TABS: 325.0000 mg | ORAL_TABLET | Freq: Every day | ORAL | 0 refills | Status: DC

## 2019-08-31 MED ORDER — POTASSIUM CHLORIDE CRYS ER 20 MEQ PO TBCR
40.0000 meq | EXTENDED_RELEASE_TABLET | Freq: Once | ORAL | Status: AC
  Administered 2019-08-31: 40 meq via ORAL
  Filled 2019-08-31: qty 2

## 2019-08-31 NOTE — Discharge Summary (Signed)
Discharge Summary  Deborah [name] FVC:[id number] DOB: [birth year]  PCP: [name], DO  Admit date: 08/19/2019 Discharge date: 08/31/2019  Time spent: 40 mins  Recommendations for Outpatient Follow-up:  1. PCP in 1 week for follow up and repeat labs  Discharge Diagnoses:  Active Hospital Problems   Diagnosis Date Noted  . Diabetic gastroparesis ([place]) 06/05/2016    Resolved Hospital Problems  No resolved problems to display.    Discharge Condition: Stable  Diet recommendation: As tolerated  Vitals:   08/30/19 2038 08/31/19 0642  BP: (!) 143/86 126/63  Pulse: (!) 105 (!) 101  Resp: 18 20  Temp: 97.6 F (36.4 C) 98 F (36.7 C)  SpO2: 100% 96%    History of present illness:  [name] y.o.African-American femalewith a known history of type II obese mellitus, chronic systolic CHF, hypertension, dyslipidemia and GERD as well as gastroparesis, presented to the emergency room with acute onset of intractable vomiting with associated epigastric and lower abdominal pain. Denies hematemesis or malena. Also had chest pain felt as pressure and graded 5/10 in severity with radiation to her left arm and associated mild dyspnea without complications. In the ED, blood pressure was 153/105 with heart rate of 115 andRR24 and temperature 98. Labs revealed hypokalemia of 3.1 glucose of 218 with a BUN of 30 and creatinine 1.61 compared to 30/1.28 on 08/07/2019. Serum lipase was 34 and BNP 60.8. High-sensitivity troponin I was 26 and later 21. CBC showed minimal leukocytosis of 11.7 with neutrophilia and anemia better than previous levels. Chest x-ray showed cardiomegaly without acute cardiopulmonary disease.EKG showed sinus tachycardia with a rate of 116 with left axis deviation poor R wave progression. Pt admitted for further management.    Today, pt still c/o abdominal pain, although denies its worsening, no further N/V, able to tolerate 100% of meals, denies  any chest pain, SOB, fever/chills. Stable for d/c with follow up with PCP. Of note, pt is a frequent flyer, and stated she will be back the second she has abdominal pain.  Hospital Course:  Active Problems:   Diabetic gastroparesis ([place])   Diabetic gastroparesis with intractable epigastric pain as well as recurrent nausea and vomiting Currently afebrile, with down trending leukocytosis CT scan unremarkable Continue as needed Zofran, cannot take reglan due to extrapyramidal side effects Continue trial of PO erythromycin, p.o. Dilaudid for pain Continue Protonix Follow-up with PCP  Volume Overload  LE edema  Combined Systolic and Diastolic HF Noted bilateral lower extremity swelling Echo from 5/17 with EF 45-50%, global hypokinesis S/p IV Lasix, continue home torsemide Follow-up with PCP  Paroxysmal Atrial Fibrillation HR controlled Continue metoprolol, eliquis  Normocytic anemia  Iron Def Anemia Hb stable Positive FOBT Labs suggestive of iron def anemia GI consulted, no endoscopic intervention planned Continue oral iron supplementation  Carcinoid Tumor Following with Dr. Irene Limbo outpatient   Hypokalemia/hypomagnesemia Replaced as needed  Acute kidney injury Baseline ~1.3-1.8 Currently being diuresed in setting of LE edema UA with RBC's, WBC's and proteinuria - follow outpatient Per renal, ? New baseline - needs to follow outpatient   Type 2 diabetes mellitus A1c on 07/22/2019 was 8.7, uncontrolled Continue home regimen  Hypertension Continue home Imdur, Toprol-XL, hydralazine, torsemide  Dyslipidemia Continue statins  Anxiety Continue Xanax.  COPD without exacerbation Continue nebulizer therapy  Morbid obesity BMI greater than 50 Lifestyle modification advised         Malnutrition Type:      Malnutrition Characteristics:      Nutrition Interventions:  Estimated body mass index is 54.09 kg/m as calculated from the  following:   Height as of this encounter: '5\' 6"'  (1.676 m).   Weight as of this encounter: 152 kg.    Procedures:  None  Consultations:  None  Discharge Exam: BP 126/63 (BP Location: Right Arm)   Pulse (!) 101   Temp 98 F (36.7 C) (Oral)   Resp 20   Ht '5\' 6"'  (1.676 m)   Wt (!) 152 kg   LMP 10/10/2012   SpO2 96%   BMI 54.09 kg/m   General: NAD Cardiovascular: S1, S2 present Respiratory: Diminished breath sounds bilaterally Abdomen: Soft, nontender, nondistended, bowel sounds present    Discharge Instructions You were cared for by a hospitalist during your hospital stay. If you have any questions about your discharge medications or the care you received while you were in the hospital after you are discharged, you can call the unit and asked to speak with the hospitalist on call if the hospitalist that took care of you is not available. Once you are discharged, your primary care physician will handle any further medical issues. Please note that NO REFILLS for any discharge medications will be authorized once you are discharged, as it is imperative that you return to your primary care physician (or establish a relationship with a primary care physician if you do not have one) for your aftercare needs so that they can reassess your need for medications and monitor your lab values.  Discharge Instructions    Diet - low sodium heart healthy   Complete by: As directed    Increase activity slowly   Complete by: As directed      Allergies as of 08/31/2019      Reactions   Contrast Media [iodinated Diagnostic Agents] Anaphylaxis   Cardiac arrest   Diazepam [name]   Isovue [iopamidol] Anaphylaxis   Patient had seizure like activity and then code post 100 cc of isovue 300   Lisinopril Anaphylaxis   Tongue and mouth swelling   Penicillins Palpitations   Has patient had a PCN reaction causing immediate rash, facial/tongue/throat swelling, SOB or lightheadedness with  hypotension: Yes, heart races Has patient had a PCN reaction causing severe rash involving mucus membranes or skin necrosis: No Has patient had a PCN reaction that required hospitalization: Yes  Has patient had a PCN reaction occurring within the last 10 years: No   Acetaminophen Nausea Only, Other (See Comments)   Irritates stomach ulcer  Abdominal pain   Tolmetin Nausea Only, Other (See Comments)   ULCER   Aspirin Other (See Comments)   Irritates stomach ulcer    Metoclopramide Other (See Comments)   Tardive dyskinesia   Nsaids Other (See Comments)   ULCER   Tramadol Nausea And Vomiting      Medication List    TAKE these medications   albuterol (2.5 MG/3ML) 0.083% nebulizer solution Commonly known as: PROVENTIL Take 3 mLs (2.5 mg total) by nebulization every 6 (six) hours as needed for wheezing or shortness of breath.   ALPRAZolam 1 MG tablet Commonly known as: XANAX Take 1 tablet (1 mg total) by mouth 2 (two) times daily as needed for anxiety.   atorvastatin 10 MG tablet Commonly known as: LIPITOR Take 1 tablet (10 mg total) by mouth daily.   blood glucose meter kit and supplies Kit Dispense based on patient and insurance preference. Use up to four times daily as directed. (FOR ICD-9 250.00, 250.01).   cetirizine  10 MG tablet Commonly known as: ZYRTEC Take 1 tablet (10 mg total) by mouth daily.   dicyclomine 20 MG tablet Commonly known as: BENTYL Take 1 tablet (20 mg total) by mouth 3 (three) times daily as needed for spasms (abdominal cramping).   DULoxetine HCl 40 MG Cpep TAKE ONE CAPSULE BY MOUTH TWICE DAILY What changed:   how much to take  when to take this   Eliquis 5 MG Tabs tablet Generic drug: apixaban TAKE 1 TABLET (5 MG TOTAL) BY MOUTH 2 (TWO) TIMES DAILY. What changed: See the new instructions.   erythromycin 250 MG tablet Commonly known as: E-MYCIN Take 1 tablet (250 mg total) by mouth 3 (three) times daily with meals.   ferrous sulfate 325  (65 FE) MG tablet Take 1 tablet (325 mg total) by mouth daily with breakfast. Start taking on: September 01, 2019   fluticasone 50 MCG/ACT nasal spray Commonly known as: FLONASE Place 2 sprays into both nostrils daily as needed for allergies or rhinitis.   hydrALAZINE 25 MG tablet Commonly known as: APRESOLINE Take 1 tablet (25 mg total) by mouth 3 (three) times daily. For blood pressure   HYDROmorphone 2 MG tablet Commonly known as: DILAUDID Take 0.5 tablets (1 mg total) by mouth every 8 (eight) hours as needed for up to 5 days for severe pain.   isosorbide mononitrate 60 MG 24 hr tablet Commonly known as: IMDUR Take 1 tablet (60 mg total) by mouth daily.   Lantus SoloStar 100 UNIT/ML Solostar Pen Generic drug: insulin glargine Inject 45 Units into the skin at bedtime.   loperamide 2 MG capsule Commonly known as: IMODIUM Take 2 mg by mouth 4 (four) times daily as needed for diarrhea or loose stools.   magnesium oxide 400 (241.3 Mg) MG tablet Commonly known as: MAG-OX Take 1 tablet (400 mg total) by mouth 2 (two) times daily.   melatonin 3 MG Tabs tablet Take 2 tablets (6 mg total) by mouth at bedtime.   metoprolol succinate 25 MG 24 hr tablet Commonly known as: TOPROL-XL Take 25 mg by mouth daily.   nitroGLYCERIN 0.4 MG SL tablet Commonly known as: NITROSTAT Place 1 tablet (0.4 mg total) under the tongue every 5 (five) minutes as needed for chest pain.   NovoLOG FlexPen 100 UNIT/ML FlexPen Generic drug: insulin aspart Inject 15 Units into the skin 3 (three) times daily with meals. What changed: how much to take   ondansetron 4 MG disintegrating tablet Commonly known as: ZOFRAN-ODT Take 1 tablet (4 mg total) by mouth every 8 (eight) hours as needed for nausea or vomiting.   pantoprazole 40 MG tablet Commonly known as: PROTONIX Take 1 tablet (40 mg total) by mouth 2 (two) times daily before a meal.   torsemide 20 MG tablet Commonly known as: DEMADEX Take 2 tablets  (40 mg total) by mouth daily. Hold if having significant vomiting      Allergies  Allergen Reactions  . Contrast Media [Iodinated Diagnostic Agents] Anaphylaxis    Cardiac arrest  . Diazepam [name]  . Isovue [Iopamidol] Anaphylaxis    Patient had seizure like activity and then code post 100 cc of isovue 300  . Lisinopril Anaphylaxis    Tongue and mouth swelling  . Penicillins Palpitations    Has patient had a PCN reaction causing immediate rash, facial/tongue/throat swelling, SOB or lightheadedness with hypotension: Yes, heart races Has patient had a PCN reaction causing severe rash involving mucus membranes or skin necrosis: No  Has patient had a PCN reaction that required hospitalization: Yes  Has patient had a PCN reaction occurring within the last 10 years: No   . Acetaminophen Nausea Only and Other (See Comments)    Irritates stomach ulcer  Abdominal pain  . Tolmetin Nausea Only and Other (See Comments)    ULCER  . Aspirin Other (See Comments)    Irritates stomach ulcer   . Metoclopramide Other (See Comments)    Tardive dyskinesia  . Nsaids Other (See Comments)    ULCER  . Tramadol Nausea And Vomiting    Follow-up Information    [name], DO. Schedule an appointment as soon as possible for a visit in 1 week(s).   Specialty: Family Medicine Contact information: [address] [place] [address] [phone]        Sueanne Margarita, MD .   Specialty: Cardiology Contact information: [phone] [address] [place] [address] [phone]                The results of significant diagnostics from this hospitalization (including imaging, microbiology, ancillary and laboratory) are listed below for reference.    Significant Diagnostic Studies: CT ABDOMEN PELVIS WO CONTRAST  Result Date: 08/23/2019 CLINICAL DATA:  Acute epigastric abdominal pain. EXAM: CT ABDOMEN AND PELVIS WITHOUT CONTRAST TECHNIQUE: Multidetector CT  imaging of the abdomen and pelvis was performed following the standard protocol without IV contrast. COMPARISON:  Aug 03, 2019.  Jul 22, 2019. FINDINGS: Lower chest: No acute abnormality. Hepatobiliary: No focal liver abnormality is seen. Status post cholecystectomy. No biliary dilatation. Pancreas: Unremarkable. No pancreatic ductal dilatation or surrounding inflammatory changes. Spleen: Normal in size without focal abnormality. Adrenals/Urinary Tract: Adrenal glands are unremarkable. Kidneys are normal, without renal calculi, focal lesion, or hydronephrosis. Bladder is unremarkable. Stomach/Bowel: Stomach is within normal limits. Appendix appears normal. No evidence of bowel wall thickening, distention, or inflammatory changes. Vascular/Lymphatic: No significant vascular findings are present. No enlarged abdominal or pelvic lymph nodes. Reproductive: No adnexal abnormality is noted. Stable uterine fibroid is noted. Other: No abdominal wall hernia or abnormality. No abdominopelvic ascites. Musculoskeletal: No acute or significant osseous findings. IMPRESSION: 1. Stable uterine fibroid. 2. No other abnormality seen in the abdomen or pelvis. Electronically Signed   By: Marijo Conception M.D.   On: 08/23/2019 16:11   CT ABDOMEN WO CONTRAST  Result Date: 08/03/2019 CLINICAL DATA:  54 year old female with abdominal pain. Status post recent endoscopy. EXAM: CT ABDOMEN WITHOUT CONTRAST TECHNIQUE: Multidetector CT imaging of the abdomen was performed following the standard protocol without IV contrast. COMPARISON:  CT abdomen pelvis dated 07/22/2019. FINDINGS: Evaluation of this exam is limited in the absence of intravenous contrast. Lower chest: There is diffuse interstitial and interlobular septal prominence which may represent mild edema. Clinical correlation is recommended. No free air or free fluid identified in the visualized upper abdomen. Hepatobiliary: Fatty infiltration of the liver. No intrahepatic biliary  ductal dilatation. Cholecystectomy. Pancreas: Unremarkable. No pancreatic ductal dilatation or surrounding inflammatory changes. Spleen: Normal in size without focal abnormality. Adrenals/Urinary Tract: The adrenal glands and kidneys are unremarkable. Stomach/Bowel: There is a 15 mm linear radiopaque metallic density in the region of the duodenal bulb, likely an ingested foreign object. No bowel obstruction or active inflammation in the visualized abdomen. Vascular/Lymphatic: Mild atherosclerotic calcification of the aorta. No adenopathy. Other: None Musculoskeletal: Mild degenerative changes of the spine. No acute osseous pathology. IMPRESSION: 1. A 15 mm linear radiopaque metallic density in the region of  the duodenal bulb, likely an ingested foreign object. No bowel obstruction or active inflammation in the visualized abdomen. No evidence of perforation. 2. Fatty liver. 3. Aortic Atherosclerosis (ICD10-I70.0). Electronically Signed   By: Anner Crete M.D.   On: 08/03/2019 15:30   DG Chest 2 View  Result Date: 08/19/2019 CLINICAL DATA:  Chest pain EXAM: CHEST - 2 VIEW COMPARISON:  07/21/2019 FINDINGS: Cardiomegaly. Both lungs are clear. Disc degenerative disease of the thoracic spine. IMPRESSION: Cardiomegaly without acute abnormality of the lungs. Electronically Signed   By: Eddie Candle M.D.   On: 08/19/2019 15:56   VAS [place] LOWER EXTREMITY VENOUS (DVT)  Result Date: 08/23/2019  Lower Venous DVTStudy Indications: Swelling.  Limitations: Body habitus and pain with compression. Comparison Study: Prior LLE venous duplex from 05/30/15 and RLE venous duplex                   from 07/19/14, are available for comparison Performing Technologist: Sharion Dove RVS  Examination Guidelines: A complete evaluation includes B-mode imaging, spectral Doppler, color Doppler, and power Doppler as needed of all accessible portions of each vessel. Bilateral testing is considered an integral part of a complete examination.  Limited examinations for reoccurring indications may be performed as noted. The reflux portion of the exam is performed with the patient in reverse Trendelenburg.  +---------+---------------+---------+-----------+----------+-------------------+ RIGHT    CompressibilityPhasicitySpontaneityPropertiesThrombus Aging      +---------+---------------+---------+-----------+----------+-------------------+ CFV      Full           Yes      Yes                                      +---------+---------------+---------+-----------+----------+-------------------+ SFJ      Full                                                             +---------+---------------+---------+-----------+----------+-------------------+ FV Prox  Full                                                             +---------+---------------+---------+-----------+----------+-------------------+ FV Mid   Full                                                             +---------+---------------+---------+-----------+----------+-------------------+ FV Distal               Yes      Yes                  patent by color and  Doppler             +---------+---------------+---------+-----------+----------+-------------------+ PFV      Full                                                             +---------+---------------+---------+-----------+----------+-------------------+ POP      Full           Yes      Yes                                      +---------+---------------+---------+-----------+----------+-------------------+ PTV      Full           Yes      Yes                                      +---------+---------------+---------+-----------+----------+-------------------+ PERO                                                  Not visualized      +---------+---------------+---------+-----------+----------+-------------------+    +---------+---------------+---------+-----------+----------+-------------------+ LEFT     CompressibilityPhasicitySpontaneityPropertiesThrombus Aging      +---------+---------------+---------+-----------+----------+-------------------+ CFV      Full           Yes      Yes                                      +---------+---------------+---------+-----------+----------+-------------------+ SFJ      Full                                                             +---------+---------------+---------+-----------+----------+-------------------+ FV Prox  Full                                                             +---------+---------------+---------+-----------+----------+-------------------+ FV Mid   Full                                                             +---------+---------------+---------+-----------+----------+-------------------+ FV Distal               Yes      Yes                  patent by color and  Doppler             +---------+---------------+---------+-----------+----------+-------------------+ POP      Full           Yes      Yes                                      +---------+---------------+---------+-----------+----------+-------------------+ PTV      Full                                                             +---------+---------------+---------+-----------+----------+-------------------+ PERO                                                  Not visualized      +---------+---------------+---------+-----------+----------+-------------------+     Summary: RIGHT: - Findings appear essentially unchanged compared to previous examination. - There is no evidence of deep vein thrombosis in the lower extremity. However, portions of this examination were limited- see technologist comments above.  LEFT: - Findings appear essentially unchanged compared to previous  examination. - There is no evidence of deep vein thrombosis in the lower extremity. However, portions of this examination were limited- see technologist comments above.  - A cystic structure is found in the popliteal fossa.  *See table(s) above for measurements and observations. Electronically signed by Monica Martinez MD on 08/23/2019 at 3:20:02 PM.    Final     Microbiology: No results found for this or any previous visit (from the past 240 hour(s)).   Labs: Basic Metabolic Panel: Recent Labs  Lab 08/25/19 0559 08/25/19 0559 08/26/19 2130 08/26/19 8657 08/27/19 8469 08/28/19 6295 08/29/19 0622 08/30/19 0528 08/31/19 0523  NA 135   < > 133*   < > 134* 133* 137 135 137  K 3.2*   < > 3.0*   < > 3.1* 3.3* 3.2* 3.2* 3.3*  CL 103   < > 103   < > 100 101 99 98 99  CO2 24   < > 24   < > '23 23 26 28 23  ' GLUCOSE 191*   < > 182*   < > 152* 272* 211* 226* 224*  BUN 24*   < > 21*   < > 24* 24* 24* 24* 28*  CREATININE 1.57*   < > 1.36*   < > 1.48* 1.50* 1.42* 1.49* 1.51*  CALCIUM 8.2*   < > 8.1*   < > 8.4* 8.3* 8.4* 9.0 9.2  MG 1.3*  --  1.6*  --  1.6* 1.7 1.5*  --   --   PHOS 2.8  --  2.9  --  3.8 3.3 3.7  --   --    < > = values in this interval not displayed.   Liver Function Tests: Recent Labs  Lab 08/25/19 0559 08/26/19 0536 08/27/19 0526 08/28/19 0528 08/29/19 0622  AST 15 12* 11* 11* 12*  ALT '13 14 13 14 13  ' ALKPHOS 87 82 86 95 87  BILITOT 0.6 0.4 0.5 0.5 0.5  PROT 6.7 6.7 6.8 6.5 6.6  ALBUMIN 2.7*  2.7* 2.9* 2.7* 2.6*   No results for input(s): LIPASE, AMYLASE in the last 168 hours. No results for input(s): AMMONIA in the last 168 hours. CBC: Recent Labs  Lab 08/27/19 0526 08/28/19 0528 08/29/19 0622 08/30/19 0528 08/31/19 0523  WBC 9.9 10.1 11.1* 13.4* 12.4*  NEUTROABS 6.9 6.6 7.8* 9.4* 8.3*  HGB 8.1* 8.0* 8.2* 8.6* 8.5*  HCT 26.3* 25.8* 26.2* 27.8* 27.4*  MCV 86.2 86.9 86.2 86.6 85.6  PLT 394 402* 433* 424* 394   Cardiac Enzymes: No results for input(s):  CKTOTAL, CKMB, CKMBINDEX, TROPONINI in the last 168 hours. BNP: BNP (last 3 results) Recent Labs    06/22/19 2123 07/02/19 1327 08/19/19 1619  BNP 70.3 61.4 60.8    ProBNP (last 3 results) No results for input(s): PROBNP in the last 8760 hours.  CBG: Recent Labs  Lab 08/30/19 1206 08/30/19 1629 08/30/19 2034 08/31/19 0741 08/31/19 1132  GLUCAP 307* 325* 376* 210* 168*       Signed:  Alma Friendly, MD [facility] 08/31/2019, 12:50 PM

## 2019-08-31 NOTE — Progress Notes (Signed)
Went over discharge papers with patient.  All questions answered. AVS given. VSS. No HH needs.

## 2019-08-31 NOTE — Care Management Important Message (Signed)
Important Message  Patient Details IM Letter given to Dessa Phi RN Case Manager to present to the Patient Name: Deborah [name] MRN: [id number] Date of Birth: 1965/12/19   Medicare Important Message Given:  Yes     Kerin Salen 08/31/2019, 12:07 PM

## 2019-09-03 ENCOUNTER — Ambulatory Visit (INDEPENDENT_AMBULATORY_CARE_PROVIDER_SITE_OTHER): Payer: Medicare Other | Admitting: Family Medicine

## 2019-09-03 ENCOUNTER — Other Ambulatory Visit: Payer: Self-pay

## 2019-09-03 ENCOUNTER — Encounter: Payer: Self-pay | Admitting: Family Medicine

## 2019-09-03 ENCOUNTER — Other Ambulatory Visit: Payer: Self-pay | Admitting: Family Medicine

## 2019-09-03 VITALS — BP 120/80 | HR 108 | Wt 322.4 lb

## 2019-09-03 DIAGNOSIS — E876 Hypokalemia: Secondary | ICD-10-CM | POA: Diagnosis present

## 2019-09-03 DIAGNOSIS — R1030 Lower abdominal pain, unspecified: Secondary | ICD-10-CM

## 2019-09-03 DIAGNOSIS — G8929 Other chronic pain: Secondary | ICD-10-CM

## 2019-09-03 DIAGNOSIS — D619 Aplastic anemia, unspecified: Secondary | ICD-10-CM

## 2019-09-03 DIAGNOSIS — F419 Anxiety disorder, unspecified: Secondary | ICD-10-CM

## 2019-09-03 MED ORDER — HYDROMORPHONE HCL 2 MG PO TABS: 1.0000 mg | ORAL_TABLET | Freq: Three times a day (TID) | ORAL | 0 refills | Status: AC | PRN

## 2019-09-03 MED ORDER — ISOSORBIDE MONONITRATE ER 60 MG PO TB24: 60.0000 mg | ORAL_TABLET | Freq: Every day | ORAL | 1 refills | Status: DC

## 2019-09-03 MED ORDER — ALPRAZOLAM 1 MG PO TABS: 1.0000 mg | ORAL_TABLET | Freq: Two times a day (BID) | ORAL | 0 refills | Status: DC | PRN

## 2019-09-03 NOTE — Patient Instructions (Signed)
It was great to see you today! Thank you for letting me participate in your care!  Today, we discussed getting specialist involved in your care and getting you a home health aid. I will contact our social worker Hilda Blades to see what we can do. I have refilled your medications and referred you to the pain clinic and to a psychiatrist for your chronic pain and your anxiety and depression respectively. Please let me know what else we can do. I will call you if your labs are abnormal.  Be well, Deborah Rutherford, DO PGY-3, [facility]

## 2019-09-03 NOTE — Progress Notes (Signed)
    SUBJECTIVE:   CHIEF COMPLAINT / HPI:    Hospital Follow Up Patient presents today for follow up after being hospitalized multiple times for several issues. She has chronic pain gastroparesis, uncontrolled T2DM, Carcinoid tumor s/p resection, and anxiety.   She is out of the hospital but states she is going back today after to appointment. She states her pain is "out of control" and she is just going to keep going back. She has been in and out of the hospital for several months. She states she will go to another hospital if [facility] will not "treat her pain". She was getting Dilaudid in the hospital and states she is able to have control of her pain when this medication. She is also endorsing severe anxiety made worse by her diagnosis of a carcinoid tumor. She is very anxious and concerned they did not get it all and has many questions about it to which I encouraged her to keep her follow up appointment with Oncology. She was getting Xanax in the hospital as needed but states she was not getting it more than twice a day.  Her diabetes is also poorly controlled.   We discussed at length her many medical problems, her frequent hospital admissions and whether or not it would be possible for her to continue getting good care continuing this pattern. She still wants to try to live at home and wishes not to return to a care facility however I broached with her the possibility that in order for her to get good care she may need to do this. I let her know Neoma Laming will be reaching out to her to discuss options.  PERTINENT  PMH / PSH: Chronic pain, gastroparesis, T2DM, Anxiety, HLD, HTN, Carcinoid tumor s/p resection  OBJECTIVE:   BP 120/80   Pulse (!) 108   Wt (!) 322 lb 6.4 oz (146.2 kg)   LMP 10/10/2012   SpO2 98%   BMI 52.04 kg/m   Gen: NAD Cardiac: RRR, no murmurs Resp: CTAB, no crackles Psych: depressed, anxious mood, limited insight, limited judgement; overall has  capacity  ASSESSMENT/PLAN:   Chronic pain Patient has been admitted to the hospital and getting narcotics to deal with her chronic pain. I do believe at this point she does have an addiction to pain medication. Given that she expressed her intention to return to the ED this afternoon and I think it is best for her to avoid further unnecessary admissions I have given her a limited supply of dilaudid same dose and frequency on which she was discharged.  Long term, she will need pain management but I am concerned if she is not given something she will go to hospitals, get admitted, and get potential more medication. - Referral to pain mangagement - Dilaudid 1mg  prn up to 3 times per day; given 5 day supply  Anxiety Poorly controlled and again I have concerns about addiction as she has been in the hospital and getting Xanax on a regular basis.  - Referral to Psych - Short term supply of Xanax to control panic attacks to prevent re-admission     [name], DO [place]

## 2019-09-04 ENCOUNTER — Ambulatory Visit: Payer: Self-pay | Admitting: Licensed Clinical Social Worker

## 2019-09-04 ENCOUNTER — Other Ambulatory Visit: Payer: Self-pay | Admitting: Family Medicine

## 2019-09-04 ENCOUNTER — Ambulatory Visit: Payer: Medicare Other | Admitting: Licensed Clinical Social Worker

## 2019-09-04 DIAGNOSIS — G894 Chronic pain syndrome: Secondary | ICD-10-CM | POA: Insufficient documentation

## 2019-09-04 DIAGNOSIS — G8929 Other chronic pain: Secondary | ICD-10-CM | POA: Insufficient documentation

## 2019-09-04 DIAGNOSIS — Z7189 Other specified counseling: Secondary | ICD-10-CM

## 2019-09-04 LAB — CBC WITH DIFFERENTIAL/PLATELET
Basophils Absolute: 0.1 10*3/uL (ref 0.0–0.2)
Basos: 1 %
EOS (ABSOLUTE): 0.3 10*3/uL (ref 0.0–0.4)
Eos: 3 %
Hematocrit: 30.4 % — ABNORMAL LOW (ref 34.0–46.6)
Hemoglobin: 9.6 g/dL — ABNORMAL LOW (ref 11.1–15.9)
Immature Grans (Abs): 0.1 10*3/uL (ref 0.0–0.1)
Immature Granulocytes: 1 %
Lymphocytes Absolute: 2.2 10*3/uL (ref 0.7–3.1)
Lymphs: 17 %
MCH: 27 pg (ref 26.6–33.0)
MCHC: 31.6 g/dL (ref 31.5–35.7)
MCV: 85 fL (ref 79–97)
Monocytes Absolute: 0.5 10*3/uL (ref 0.1–0.9)
Monocytes: 4 %
Neutrophils Absolute: 9.6 10*3/uL — ABNORMAL HIGH (ref 1.4–7.0)
Neutrophils: 74 %
Platelets: 451 10*3/uL — ABNORMAL HIGH (ref 150–450)
RBC: 3.56 x10E6/uL — ABNORMAL LOW (ref 3.77–5.28)
RDW: 13.6 % (ref 11.7–15.4)
WBC: 12.8 10*3/uL — ABNORMAL HIGH (ref 3.4–10.8)

## 2019-09-04 LAB — BASIC METABOLIC PANEL
BUN/Creatinine Ratio: 18 (ref 9–23)
BUN: 33 mg/dL — ABNORMAL HIGH (ref 6–24)
CO2: 20 mmol/L (ref 20–29)
Calcium: 9.3 mg/dL (ref 8.7–10.2)
Chloride: 99 mmol/L (ref 96–106)
Creatinine, Ser: 1.81 mg/dL — ABNORMAL HIGH (ref 0.57–1.00)
GFR calc Af Amer: 36 mL/min/{1.73_m2} — ABNORMAL LOW (ref >59)
GFR calc non Af Amer: 31 mL/min/{1.73_m2} — ABNORMAL LOW (ref >59)
Glucose: 371 mg/dL — ABNORMAL HIGH (ref 65–99)
Potassium: 4.1 mmol/L (ref 3.5–5.2)
Sodium: 136 mmol/L (ref 134–144)

## 2019-09-04 LAB — MAGNESIUM: Magnesium: 1.4 mg/dL — ABNORMAL LOW (ref 1.6–2.3)

## 2019-09-04 NOTE — Assessment & Plan Note (Signed)
Patient has been admitted to the hospital and getting narcotics to deal with her chronic pain. I do believe at this point she does have an addiction to pain medication. Given that she expressed her intention to return to the ED this afternoon and I think it is best for her to avoid further unnecessary admissions I have given her a limited supply of dilaudid same dose and frequency on which she was discharged.  Long term, she will need pain management but I am concerned if she is not given something she will go to hospitals, get admitted, and get potential more medication. - Referral to pain mangagement - Dilaudid 1mg  prn up to 3 times per day; given 5 day supply

## 2019-09-04 NOTE — Chronic Care Management (AMB) (Signed)
Care Management   Clinical Social Work initial Note  09/04/2019 Name: Deborah [name] MRN: [id number] DOB: 12/19/1965 Deborah [name] is a 54 y.o. year old female who sees [name], DO for primary care. The Care Management team was consulted by PCP to assist the patient with Level of Care Concerns and Mental Health Counseling and Resources. Disease Management Care Coordination.  LCSW reached out to [facility] today by phone to introduce self, assess needs and barriers to care.    Assessment: Patient is pleasant and engaged in conversation.  Experiencing  difficulty with meeting personal care needs and managing chronic medical conditions.        Recommendation: Patient may benefit from, and is in agreement to talking with PACE intake worker to find out more information about the PACE program. See care plan below or other goals. Plan: LCSW will F/U with patient in 2 to 3 days  Referral make to PACE program  Patient calling PCS agency to set up start date for aide.  Review of patient status, including review of consultants reports, relevant laboratory and other test results, and collaboration with appropriate care team members and the patient's provider was performed as part of comprehensive patient evaluation and provision of chronic care management services.    Advance Directive Status:  Not addressed in this encounter SDOH (Social Determinants of Health) assessments performed: No needs identified    ; Goals Addressed            This Visit's Progress   . Connect with psychiatry       CARE PLAN ENTRY (see longitudinal plan of care for additional care plan information)  Current Barriers:  . Patient with Anxiety and Depression acknowledges deficits with connecting to mental health provider for medication management.  . Patient needs Support, Education, and Care Coordination in order to meet unmet mental health needs  . Patient would like to focus on one goal  at a time, will address this goal during next encounter 2 to 3 days Clinical Social Work Goal(s):  [name] Over the next 30 days, patient will work with SW to connect for ongoing counseling resources and psychiatry .  Interventions:  . Assessed patient's previous treatment, needs and barriers to care . Collaborated with PCP regarding patient needs Patient Self Care Activities & Deficits:  . Patient is unable to independently navigate community resource options without care coordination support . Patient is able to implement clinical interventions discussed today and is motivated for treatment  . Patient is motivated for treatment Initial goal documentation     . personal care services       CARE PLAN ENTRY (see longitudinal plan of care for additional care plan information)  Current Barriers:   . Patient with chronic medical conditions needs Support, Education, and Care Coordination to resolve unmet Personal Care needs . Patient needs to contact [facility] Clinical Social Work Goal(s):  [name] Over the next 30 to 45 days, patient will have personal care needs met as evident by having PCS Aide in the home assisting with needs.  Interventions provided by LCSW : . Assessed needs, level of care concerns, basic eligibility and provided education on Personal Care Service process,  . Collaborate with primary care provider  and CCM RN ref patient's need . Also discussed additional support options with PACE program, patient in agreement to hear more information.  Verbal permission to make referrl . PACE referral placed with [facility] Patient Self Care  Activities & Deficits:  . Patient is unable to perform ADLs independently without assistance  . Family/support system will assist patient with meeting needs until [facility] is approved . Call PCS agency to confirm start date . Call [facility] with questions [phone] or [phone]  Initial goal documentation          Outpatient Encounter Medications as of 09/04/2019  Medication Sig  . albuterol (PROVENTIL) (2.5 MG/3ML) 0.083% nebulizer solution Take 3 mLs (2.5 mg total) by nebulization every 6 (six) hours as needed for wheezing or shortness of breath.  . ALPRAZolam (XANAX) 1 MG tablet Take 1 tablet (1 mg total) by mouth 2 (two) times daily as needed for anxiety.  [name] atorvastatin (LIPITOR) 10 MG tablet Take 1 tablet (10 mg total) by mouth daily.  . blood glucose meter kit and supplies KIT Dispense based on patient and insurance preference. Use up to four times daily as directed. (FOR ICD-9 250.00, 250.01).  . cetirizine (ZYRTEC) 10 MG tablet Take 1 tablet (10 mg total) by mouth daily.  [name] dicyclomine (BENTYL) 20 MG tablet Take 1 tablet (20 mg total) by mouth 3 (three) times daily as needed for spasms (abdominal cramping).  . DULoxetine HCl 40 MG CPEP TAKE ONE CAPSULE BY MOUTH TWICE DAILY (Patient taking differently: Take 40 mg by mouth in the morning and at bedtime. )  . ELIQUIS 5 MG TABS tablet TAKE 1 TABLET BY MOUTH 2 (TWO) TIMES DAILY.  [name] erythromycin (E-MYCIN) 250 MG tablet Take 1 tablet (250 mg total) by mouth 3 (three) times daily with meals.  . ferrous sulfate 325 (65 FE) MG tablet Take 1 tablet (325 mg total) by mouth daily with breakfast.  . fluticasone (FLONASE) 50 MCG/ACT nasal spray Place 2 sprays into both nostrils daily as needed for allergies or rhinitis.  . hydrALAZINE (APRESOLINE) 25 MG tablet Take 1 tablet (25 mg total) by mouth 3 (three) times daily. For blood pressure  . HYDROmorphone (DILAUDID) 2 MG tablet Take 0.5 tablets (1 mg total) by mouth every 8 (eight) hours as needed for up to 7 days for severe pain.  [name] insulin aspart (NOVOLOG FLEXPEN) 100 UNIT/ML FlexPen Inject 15 Units into the skin 3 (three) times daily with meals. (Patient taking differently: Inject 30 Units into the skin 3 (three) times daily with meals. )  . isosorbide mononitrate (IMDUR) 60 MG 24 hr tablet Take 1 tablet (60 mg  total) by mouth daily.  [name] LANTUS SOLOSTAR 100 UNIT/ML Solostar Pen Inject 45 Units into the skin at bedtime.  [name] loperamide (IMODIUM) 2 MG capsule Take 2 mg by mouth 4 (four) times daily as needed for diarrhea or loose stools.  . magnesium oxide (MAG-OX) 400 (241.3 Mg) MG tablet Take 1 tablet (400 mg total) by mouth 2 (two) times daily.  . Melatonin 3 MG TABS Take 2 tablets (6 mg total) by mouth at bedtime.  . metoprolol succinate (TOPROL-XL) 25 MG 24 hr tablet Take 25 mg by mouth daily.  . nitroGLYCERIN (NITROSTAT) 0.4 MG SL tablet Place 1 tablet (0.4 mg total) under the tongue every 5 (five) minutes as needed for chest pain.  [name] ondansetron (ZOFRAN-ODT) 4 MG disintegrating tablet Take 1 tablet (4 mg total) by mouth every 8 (eight) hours as needed for nausea or vomiting.  . pantoprazole (PROTONIX) 40 MG tablet TAKE ONE TABLET BY MOUTH TWICE DAILY  . torsemide (DEMADEX) 20 MG tablet Take 2 tablets (40 mg total) by mouth daily. Hold if having significant vomiting  No facility-administered encounter medications on file as of 09/04/2019.       Information about Care Management services was shared with Ms.  Murfin today including:  1. Care Management services include personalized support from designated clinical staff supervised by her physician, including individualized plan of care and coordination with other care providers 2. Remind patient of 24/7 contact phone numbers to provider's office for assistance with urgent and routine care needs. 3. Care Management services are voluntary and patient may stop at any time .  Patient agreed to services provided today and verbal consent obtained.     [name], [place] / [place]   [phone] 3:48 PM

## 2019-09-04 NOTE — Assessment & Plan Note (Signed)
Poorly controlled and again I have concerns about addiction as she has been in the hospital and getting Xanax on a regular basis.  - Referral to Psych - Short term supply of Xanax to control panic attacks to prevent re-admission

## 2019-09-04 NOTE — Chronic Care Management (AMB) (Signed)
  Social Work Care Management  Unsuccessful Phone Outreach   09/04/2019 Name: Deborah [name] MRN: [id number] DOB: 17-Sep-1965 Reason for referral : Care Coordination (assessment of needs)   Deborah [name] is a 54 y.o. year old female who sees [place], Christia [place], DO for primary care.   LCSW called patient to assess needs and barriers reference the above referral. Telephone outreach was unsuccessful. A HIPPA compliant phone message was left for the patient providing contact information and requesting a return call.  Plan:  If no return call is received. LCSW will call again in 3 to 5 days.  Review of patient status, including review of consultants reports, relevant laboratory and other test results, and collaboration with appropriate care team members and the patient's provider was performed as part of comprehensive patient evaluation and provision of care management services.   [name], [place] / [place]   [phone] 9:33 AM

## 2019-09-06 ENCOUNTER — Ambulatory Visit: Payer: Self-pay | Admitting: Licensed Clinical Social Worker

## 2019-09-06 DIAGNOSIS — Z741 Need for assistance with personal care: Secondary | ICD-10-CM

## 2019-09-06 NOTE — Chronic Care Management (AMB) (Signed)
Care Management   Clinical Social Work Follow Up   09/06/2019 Name: Deborah [name] MRN: [id number] DOB: 07/29/1965 Referred by: Cleophas Dunker, DO  Reason for referral : Care Coordination (PCS )  Deborah [name] is a 54 y.o. year old female who is a primary care patient of Cleophas Dunker, DO.  Reason for follow-up: assess for barriers and progress with person care needs .   Assessment: Patient continues to experience to have difficulty meeting ADL's.  PCS form placed in mailbox for PCP. Plan:  1. Patient will call to get on wait list for CAP,  2.  LCSW will F/U in 2 weeks 3. Patient has phone appointment with CCM RN in 1 week Advance Directive Status: N See Care Plan and [employer] application for related entries. ; not addressed during this encounter.  SDOH (Social Determinants of Health) assessments performed: No ; Not during this encounter    Goals Addressed            This Visit's Progress   . Connect with psychiatry   Not on track    [name] (see longitudinal plan of care for additional care plan information)  Current Barriers:  . Patient with Anxiety and Depression acknowledges deficits with connecting to mental health provider for medication management.  . Patient needs Support, Education, and Care Coordination in order to meet unmet mental health needs  . Patient would like to focus on one goal at a time, will address this goal during next encounter 2 to 3 days Clinical Social Work Goal(s):  [name] Over the next 30 days, patient will work with SW to connect for ongoing counseling resources and psychiatry .  Interventions:  . Assessed patient's previous treatment, needs and barriers to care . Collaborated with PCP regarding patient needs Patient Self Care Activities & Deficits:  . Patient is unable to independently navigate community resource options without care coordination support . Patient is able to implement clinical interventions discussed  today and is motivated for treatment  . Patient is motivated for treatment Initial goal documentation     . personal care services   On track    [place] (see longitudinal plan of care for additional care plan information)  Current Barriers:   . Patient with chronic medical conditions needs Support, Education, and Care Coordination to resolve unmet Personal Care needs . Patient contacted [facility] for start date . Patient requesting resubmitting PCS form for change in medical status for increase in PCS hours  Clinical Social Work Goal(s):  [name] Over the next 30 to 45 days, patient will have personal care needs met as evident by having PCS Aide in the home assisting with needs.  Interventions provided by LCSW : . Assessed needs, level of care concerns, basic eligibility and provided education on Personal Care Service process,  . Collaborate with primary care provider  and CCM RN ref patient's need . PCS form placed in mailbox for Dr. [name] 09/06/19 . Also discussed additional support options with PACE program, patient in agreement to hear more information.  Verbal permission to make referrl . PACE referral placed Patient has spoken with PACE and waiting on next steps . Spoke to patient and provided phone number for CAP program that will provide up to 8 hours of care per day  She will call to request [facility] Patient Self Care Activities & Deficits:  . Patient is unable to perform ADLs independently without  assistance  . Family/support system will assist patient with meeting needs until [name] is approved . Call PCS agency to confirm start date . Call [facility] with questions [phone] or [phone]  Please see past updates related to this goal by clicking on the "Past Updates" button in the selected goal         Outpatient Encounter Medications as of 09/06/2019  Medication Sig  . albuterol (PROVENTIL) (2.5 MG/3ML) 0.083%  nebulizer solution Take 3 mLs (2.5 mg total) by nebulization every 6 (six) hours as needed for wheezing or shortness of breath.  . ALPRAZolam (XANAX) 1 MG tablet Take 1 tablet (1 mg total) by mouth 2 (two) times daily as needed for anxiety.  [name] amLODipine (NORVASC) 10 MG tablet Take 1 tablet (10 mg total) by mouth daily.  [name] atorvastatin (LIPITOR) 10 MG tablet Take 1 tablet (10 mg total) by mouth daily.  . blood glucose meter kit and supplies KIT Dispense based on patient and insurance preference. Use up to four times daily as directed. (FOR ICD-9 250.00, 250.01).  . cetirizine (ZYRTEC) 10 MG tablet Take 1 tablet (10 mg total) by mouth daily.  [name] dicyclomine (BENTYL) 20 MG tablet Take 1 tablet (20 mg total) by mouth 3 (three) times daily as needed for spasms (abdominal cramping).  . DULoxetine HCl 40 MG CPEP TAKE ONE CAPSULE BY MOUTH TWICE DAILY (Patient taking differently: Take 40 mg by mouth in the morning and at bedtime. )  . ELIQUIS 5 MG TABS tablet TAKE 1 TABLET BY MOUTH 2 (TWO) TIMES DAILY.  [name] erythromycin (E-MYCIN) 250 MG tablet Take 1 tablet (250 mg total) by mouth 3 (three) times daily with meals.  . ferrous sulfate 325 (65 FE) MG tablet Take 1 tablet (325 mg total) by mouth daily with breakfast.  . fluticasone (FLONASE) 50 MCG/ACT nasal spray Place 2 sprays into both nostrils daily as needed for allergies or rhinitis.  . hydrALAZINE (APRESOLINE) 25 MG tablet Take 1 tablet (25 mg total) by mouth 3 (three) times daily. For blood pressure  . HYDROmorphone (DILAUDID) 2 MG tablet Take 0.5 tablets (1 mg total) by mouth every 8 (eight) hours as needed for up to 7 days for severe pain.  [name] insulin aspart (NOVOLOG FLEXPEN) 100 UNIT/ML FlexPen Inject 15 Units into the skin 3 (three) times daily with meals. (Patient taking differently: Inject 30 Units into the skin 3 (three) times daily with meals. )  . isosorbide mononitrate (IMDUR) 60 MG 24 hr tablet Take 1 tablet (60 mg total) by mouth daily.  [name] LANTUS  SOLOSTAR 100 UNIT/ML Solostar Pen Inject 45 Units into the skin at bedtime.  [name] loperamide (IMODIUM) 2 MG capsule Take 2 mg by mouth 4 (four) times daily as needed for diarrhea or loose stools.  . magnesium oxide (MAG-OX) 400 (241.3 Mg) MG tablet Take 1 tablet (400 mg total) by mouth 2 (two) times daily.  . melatonin 3 MG TABS tablet TAKE 2 TABLETS BY MOUTH AT BEDTIME.  . metoprolol succinate (TOPROL-XL) 25 MG 24 hr tablet TAKE ONE TABLET BY MOUTH ONCE DAILY  . nitroGLYCERIN (NITROSTAT) 0.4 MG SL tablet Place 1 tablet (0.4 mg total) under the tongue every 5 (five) minutes as needed for chest pain.  [name] ondansetron (ZOFRAN-ODT) 4 MG disintegrating tablet Take 1 tablet (4 mg total) by mouth every 8 (eight) hours as needed for nausea or vomiting.  . pantoprazole (PROTONIX) 40 MG tablet TAKE ONE TABLET BY MOUTH TWICE DAILY  . torsemide (  DEMADEX) 20 MG tablet Take 2 tablets (40 mg total) by mouth daily. Hold if having significant vomiting   No facility-administered encounter medications on file as of 09/06/2019.    Review of patient status, including review of consultants reports, relevant laboratory and other test results, and collaboration with appropriate care team members and the patient's provider was performed as part of comprehensive patient evaluation and provision of care management services.  [name], [place] / [place]   [phone] 8:45 AM

## 2019-09-07 ENCOUNTER — Encounter: Payer: Self-pay | Admitting: Physical Medicine and Rehabilitation

## 2019-09-07 IMAGING — CT CT ANGIO CHEST
2 of 6 series · 19 of 36 positions shown · IV contrast (Omni 300)
Comparison: Chest radiograph earlier today.  CT chest 05/26/2015.

CLINICAL DATA: Chest pain and shortness of breath. Elevated
D-dimer.

EXAM:
CT ANGIOGRAPHY CHEST WITH CONTRAST
TECHNIQUE: Multidetector CT imaging of the chest was performed using the
standard protocol during bolus administration of intravenous
contrast. Multiplanar CT image reconstructions and MIPs were
obtained to evaluate the vascular anatomy.
CONTRAST:  100 mL [id number] IOPAMIDOL ([id number]) INJECTION 76%

[Series 6: pe thins · axial · 0.89mm/px · z∈[+1154,+1404]mm · 18 of 392 slices shown]
[im 18/392  lung]
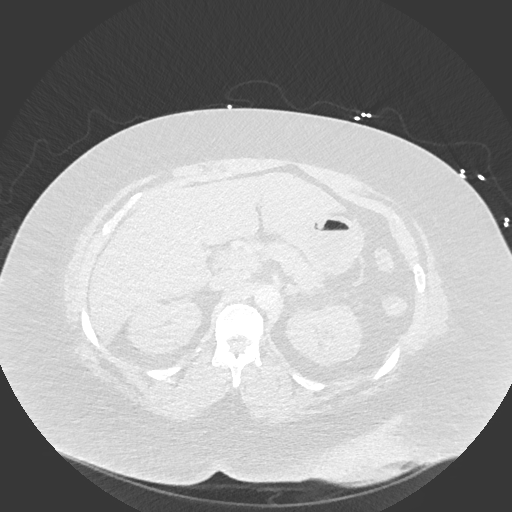
[im 36/392  mediastinal]
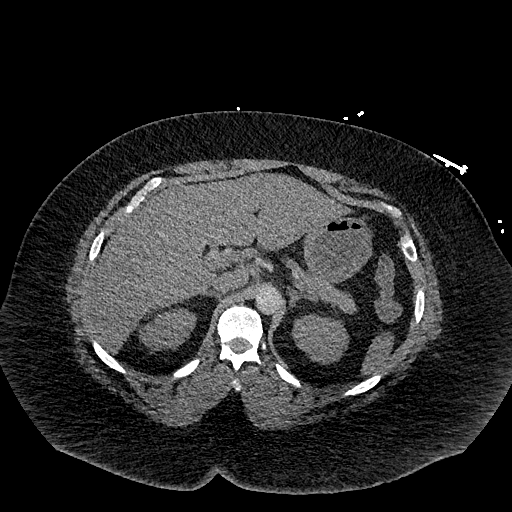
[im 54/392  lung]
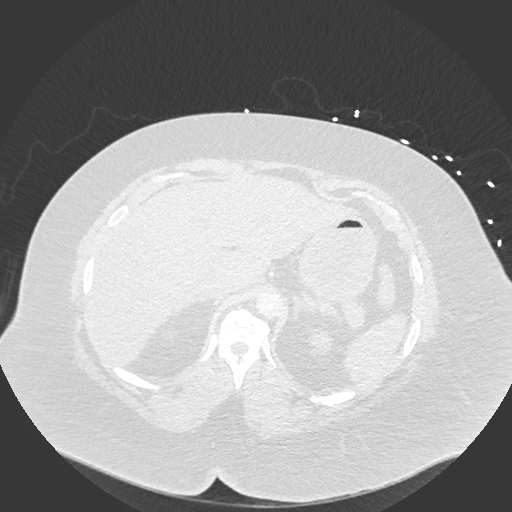
[im 89/392  mediastinal]
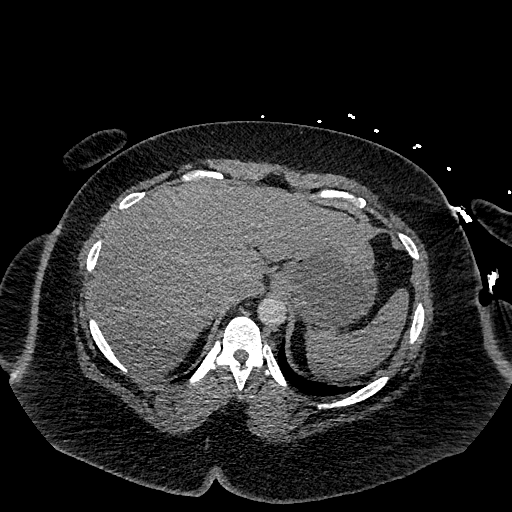
[im 107/392  lung]
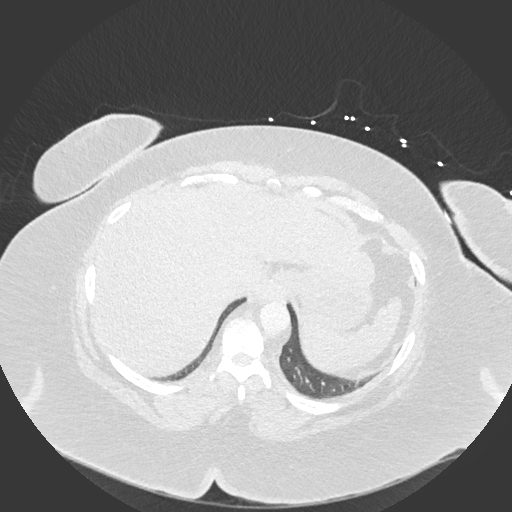
[im 125/392  mediastinal]
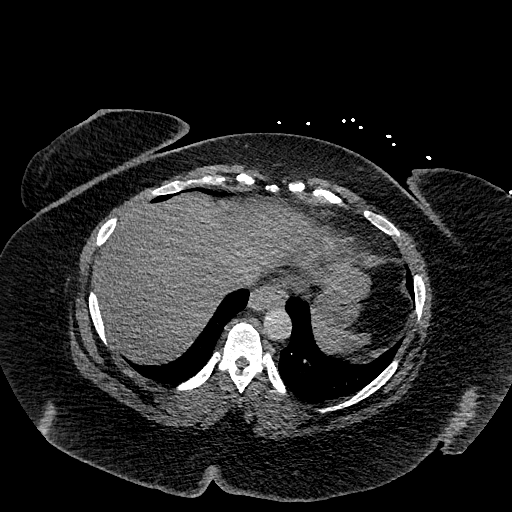
[im 143/392  lung]
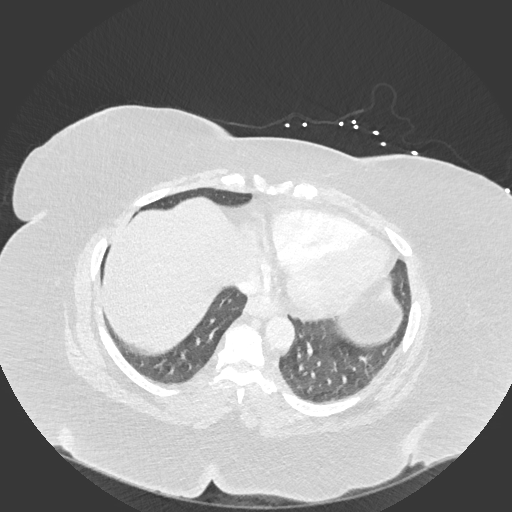
[im 160/392  mediastinal]
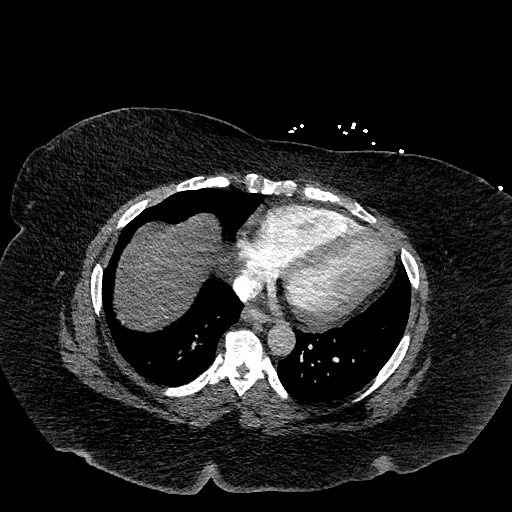
[im 178/392  lung]
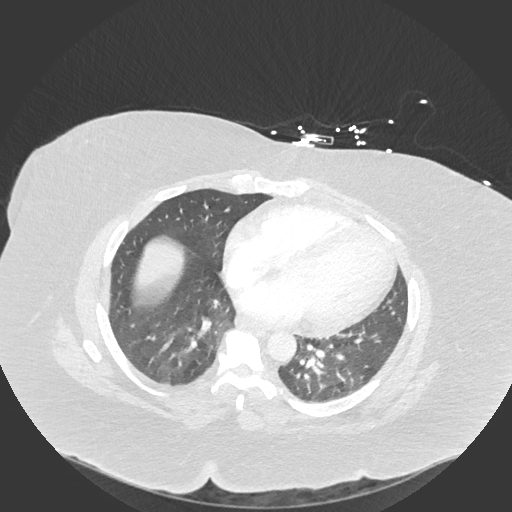
[im 214/392  mediastinal]
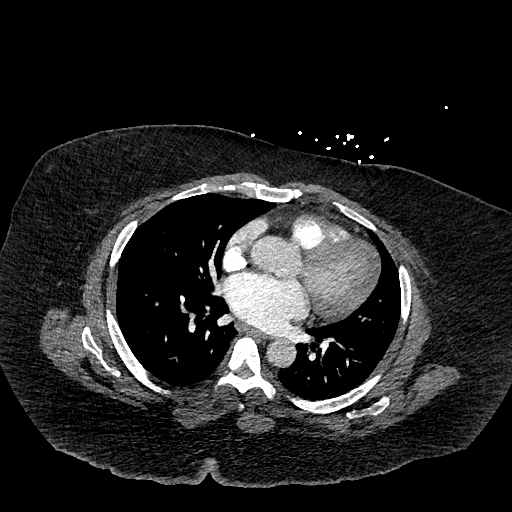
[im 232/392  lung]
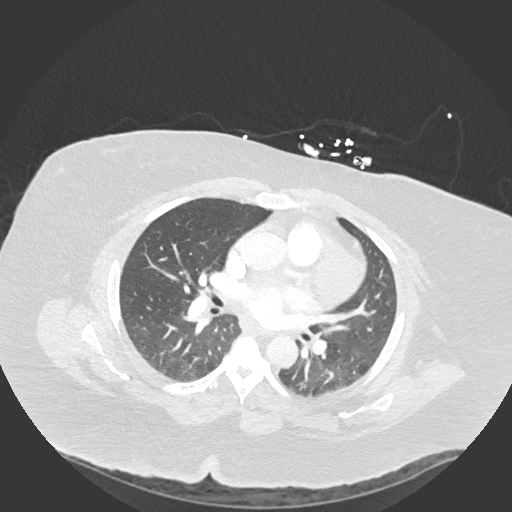
[im 249/392  mediastinal]
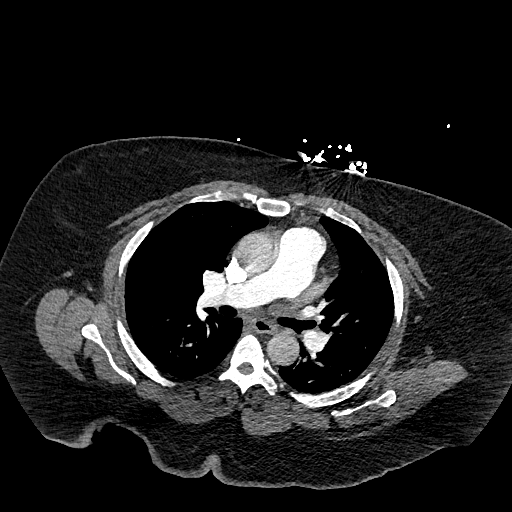
[im 267/392  lung]
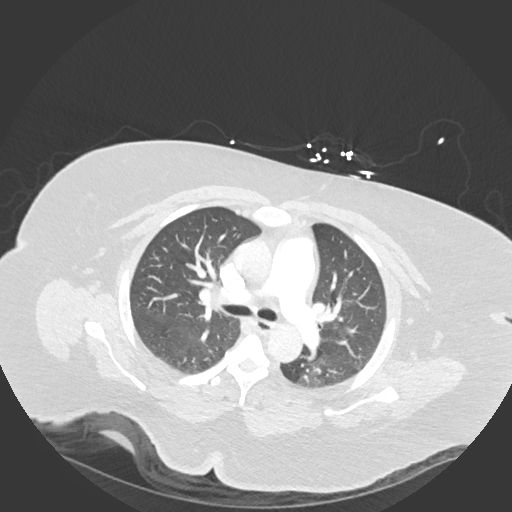
[im 285/392  mediastinal]
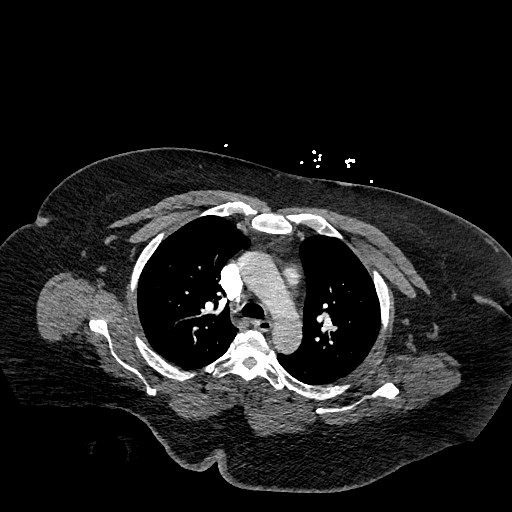
[im 303/392  lung]
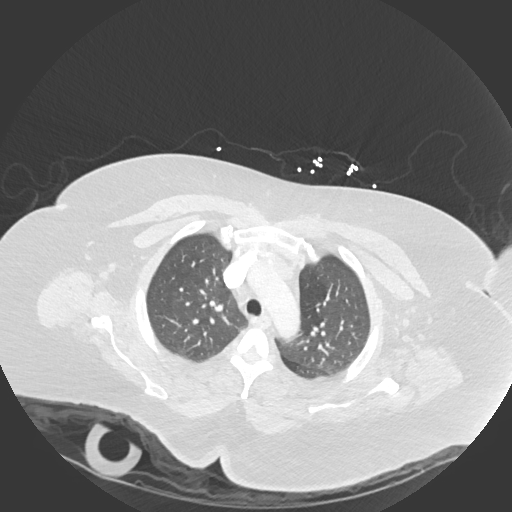
[im 338/392  mediastinal]
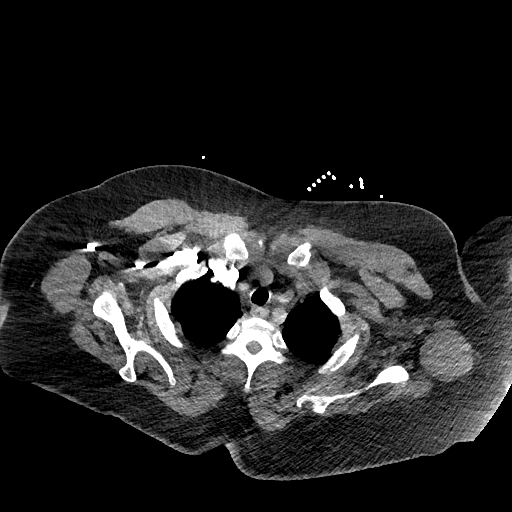
[im 356/392  lung]
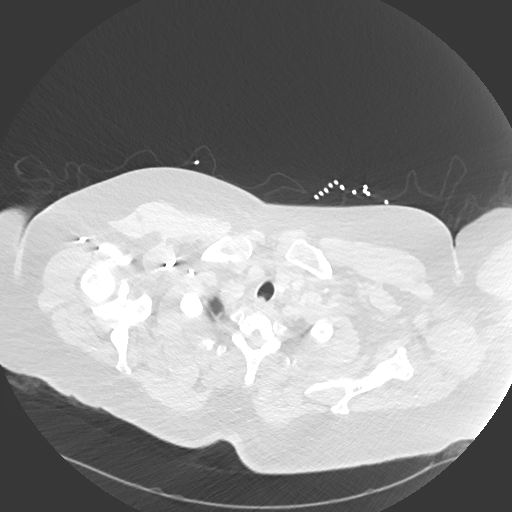
[im 374/392  mediastinal]
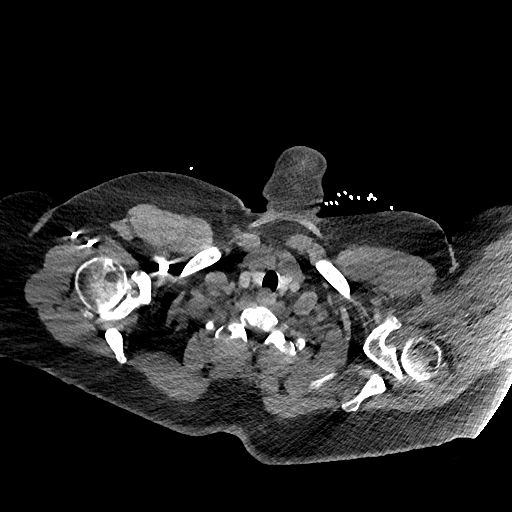

[Series 7: pe 2mm cor · coronal · 0.56mm/px · 1 of 84 slices shown]
[im 42/84  mediastinal]
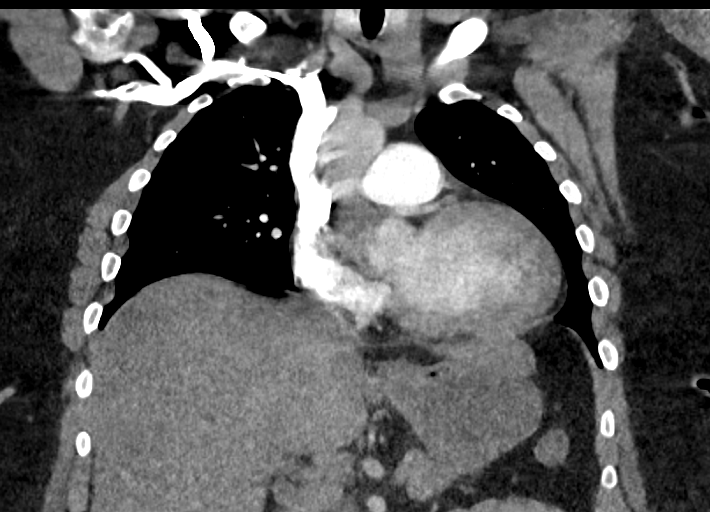

[19 of 36 positions shown; findings below may reference images not displayed]

FINDINGS: Cardiovascular: Satisfactory opacification of the pulmonary arteries
to the segmental level. No evidence of pulmonary embolism. Mild
cardiac enlargement. No pericardial effusion. Mild calcification of
the transverse arch, not involving great vessels.

Mediastinum/Nodes: No enlarged mediastinal, hilar, or axillary lymph
nodes. Thyroid gland, trachea, and esophagus demonstrate no
significant findings.

Lungs/Pleura: Lungs are clear. No pleural effusion or pneumothorax.

Upper Abdomen: Fatty infiltration of the liver.

Musculoskeletal: No worrisome osseous lesion.  Thoracic spondylosis.

Review of the MIP images confirms the above findings.
IMPRESSION: No evidence for pulmonary emboli. Mild cardiac enlargement. No
visible lung opacity.

Aortic Atherosclerosis ([id number]-[id number]).

## 2019-09-07 IMAGING — DX DG CHEST 2V
2 series · 2 of 2 positions shown · non-contrast
Comparison: 07/04/2016; 05/25/2015; chest CT-05/26/2015

CLINICAL DATA: Chest pain and shortness of breath for the past
week.

EXAM:
CHEST - 2 VIEW

[chest pa]
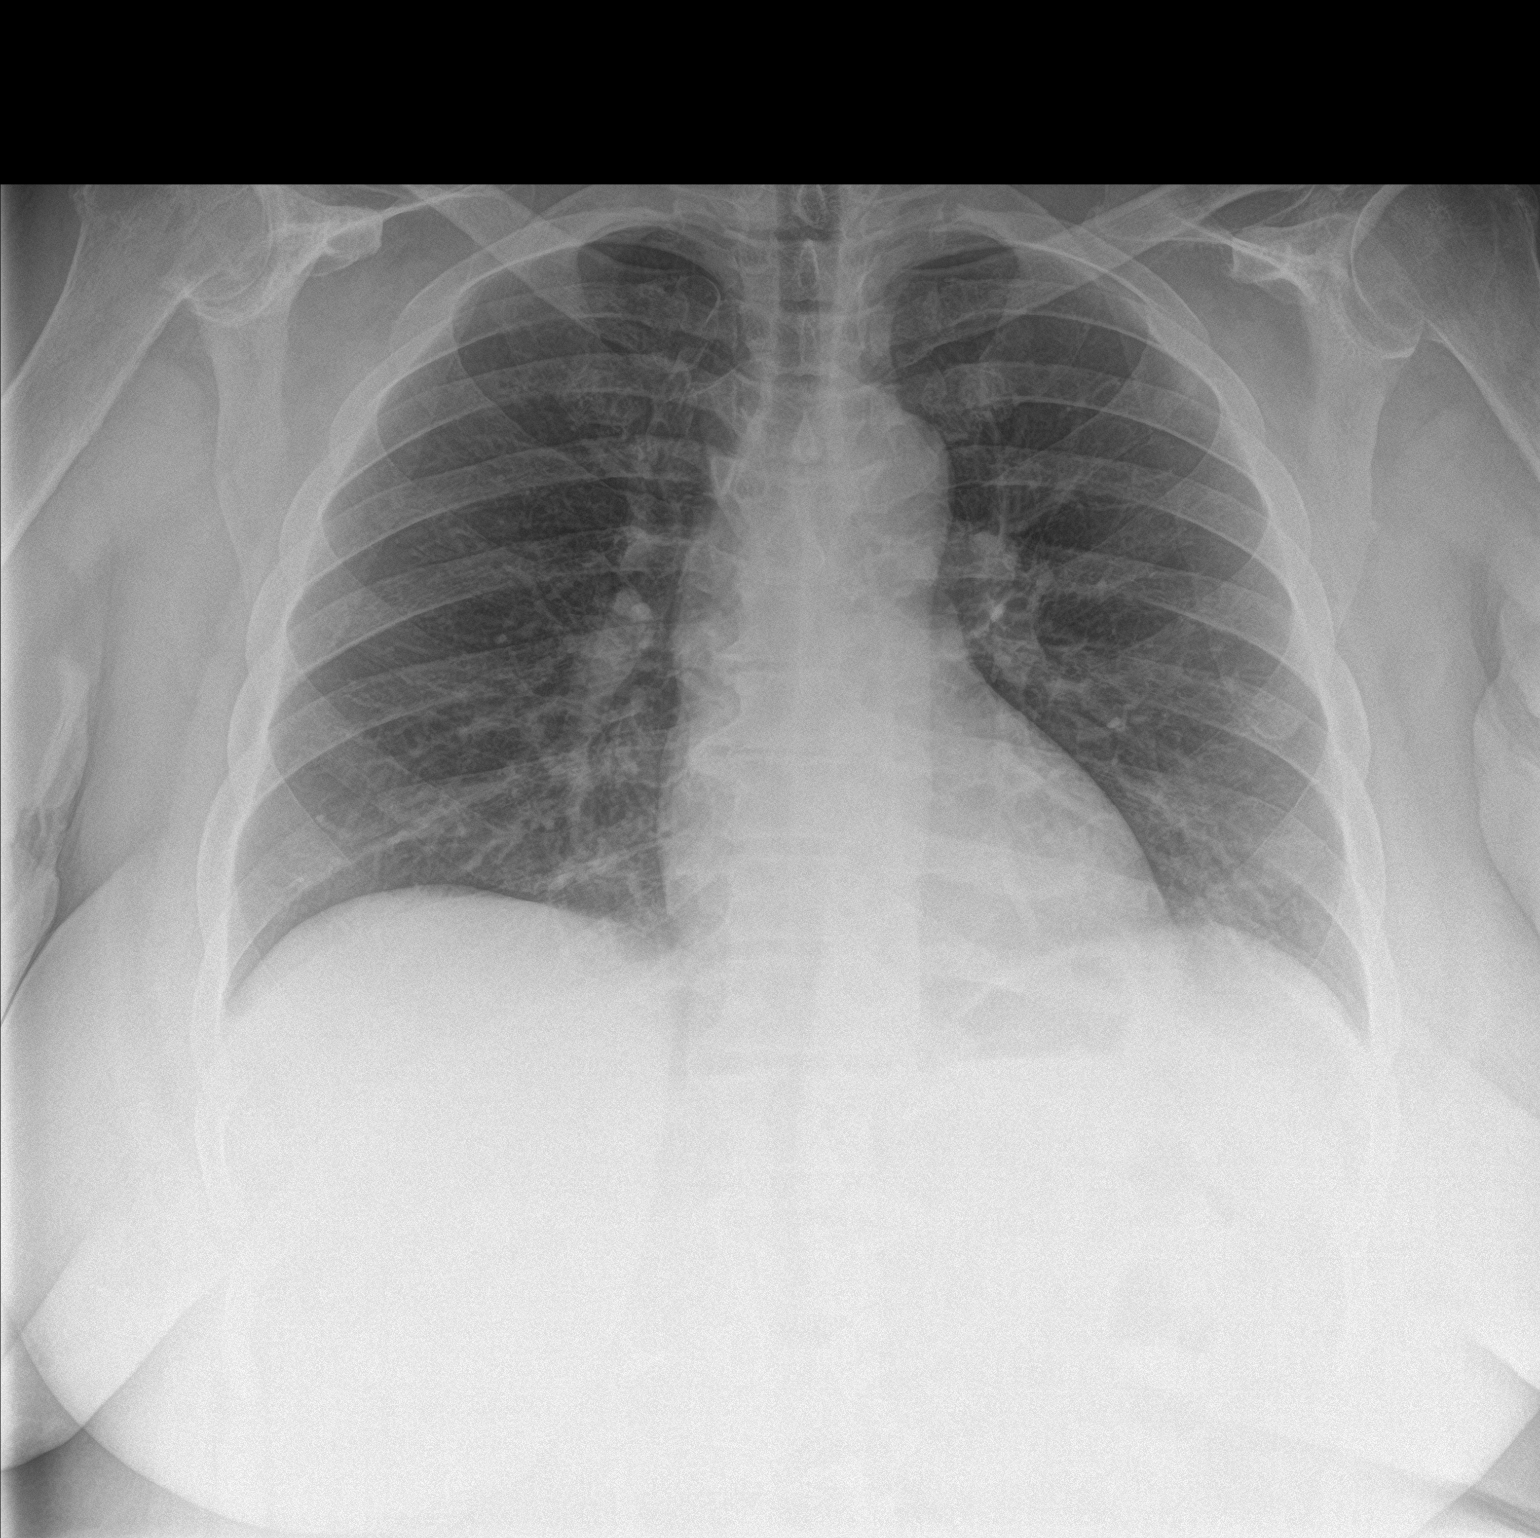

[chest lat]
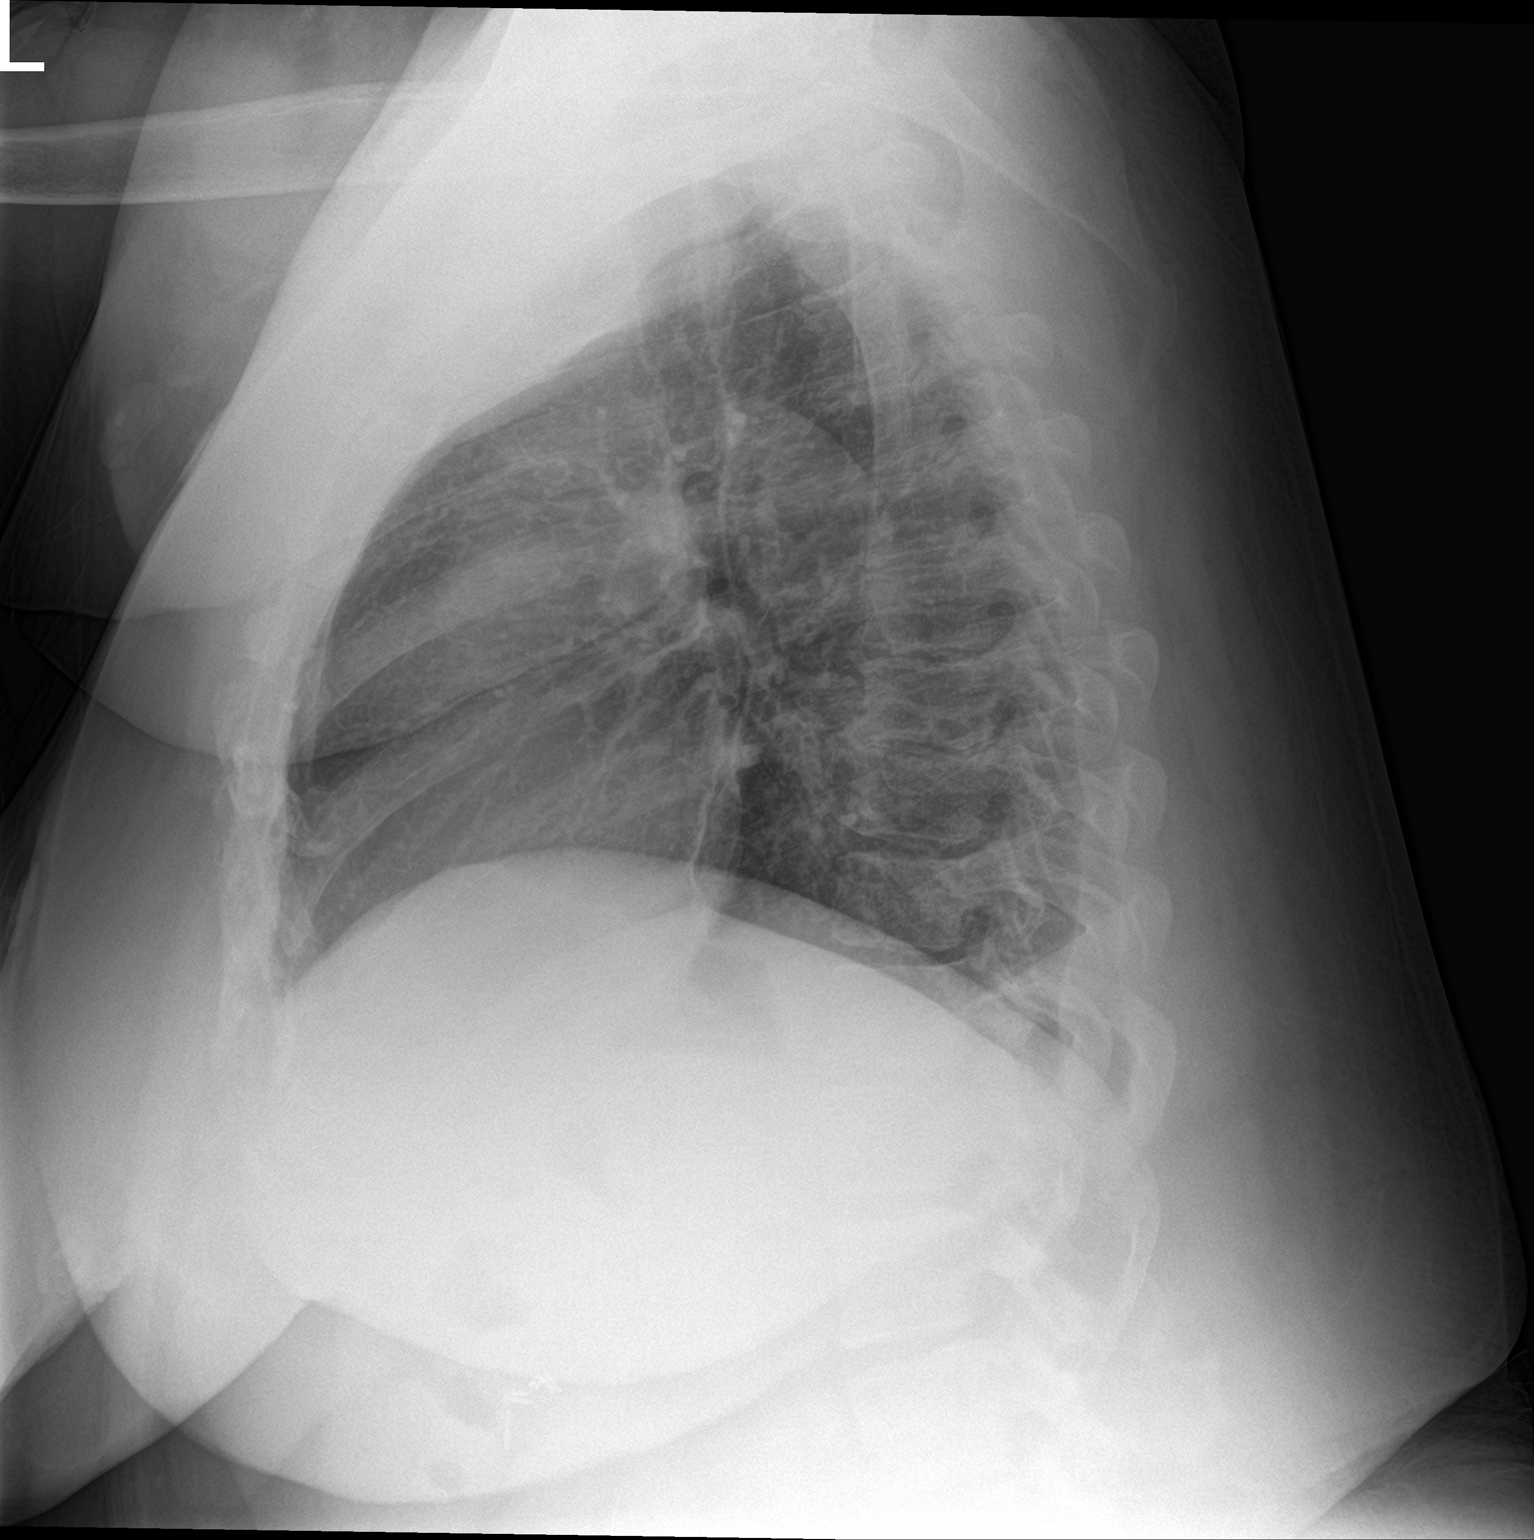

[2 of 2 positions shown; findings below may reference images not displayed]

FINDINGS: Grossly unchanged cardiac silhouette and mediastinal contours. No
focal parenchymal opacities. No pleural effusion or pneumothorax. No
evidence of edema. No acute osseus abnormalities. Suspected
degenerative change of the bilateral glenohumeral and
acromioclavicular joints, incompletely evaluated. Post
cholecystectomy.
IMPRESSION: No acute cardiopulmonary disease.

## 2019-09-08 IMAGING — CT CT ABD-PELV W/ CM
2 of 5 series · 17 of 46 positions shown, 19 images · IV contrast (APPLIED)
Comparison: Body CT 07/04/2016

CLINICAL DATA: Generalized abdominal pain.

EXAM:
CT ABDOMEN AND PELVIS WITH CONTRAST
TECHNIQUE: Multidetector CT imaging of the abdomen and pelvis was performed
using the standard protocol following bolus administration of
intravenous contrast.
CONTRAST:  100mL OMNIPAQUE IOHEXOL 300 MG/ML  SOLN

[Series 3: abdomen 5.0 · axial · 0.98mm/px · z∈[+866,+1286]mm · 14 of 96 slices shown, 16 images]
[im 6/96  soft-tissue]
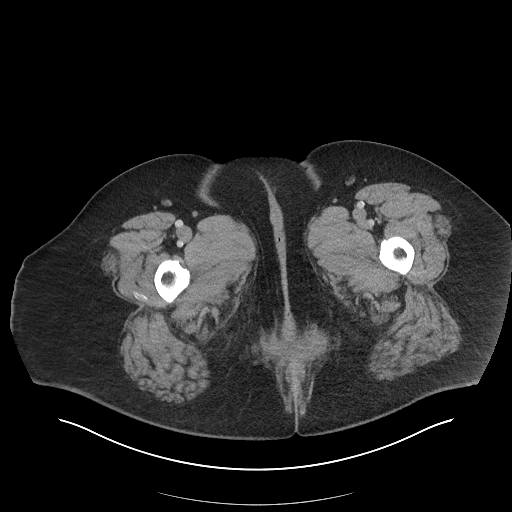
[im 6/96  bone]
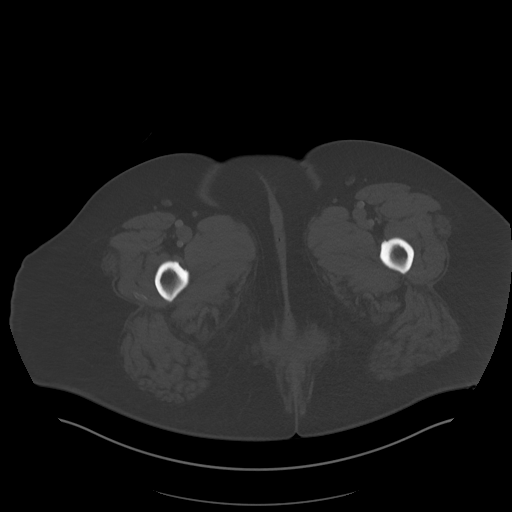
[im 12/96  soft-tissue]
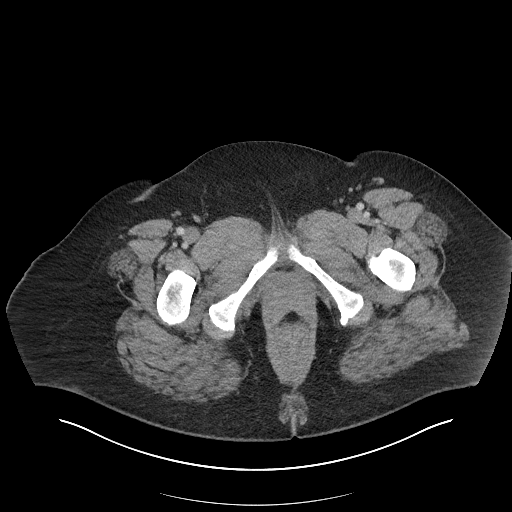
[im 18/96  soft-tissue]
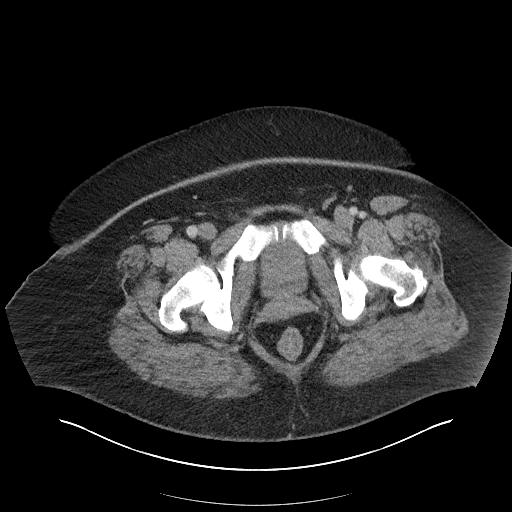
[im 24/96  soft-tissue]
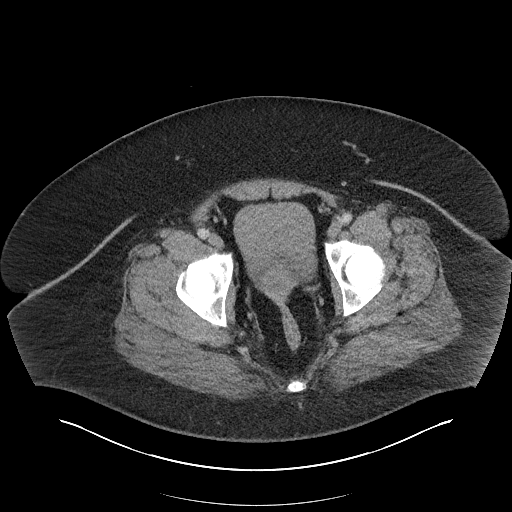
[im 30/96  soft-tissue]
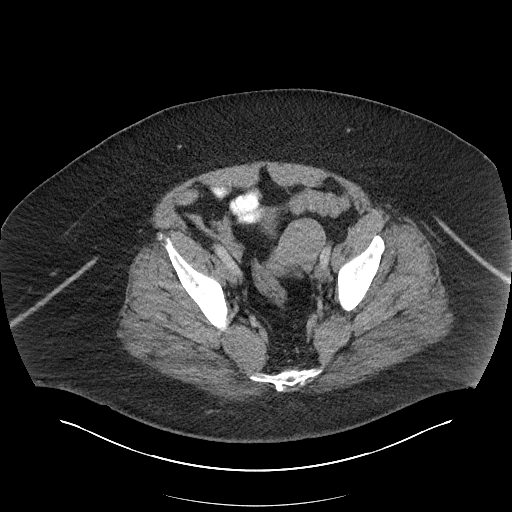
[im 36/96  soft-tissue]
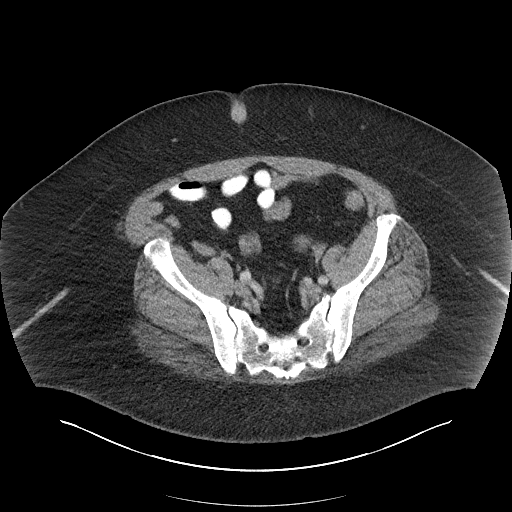
[im 42/96  soft-tissue]
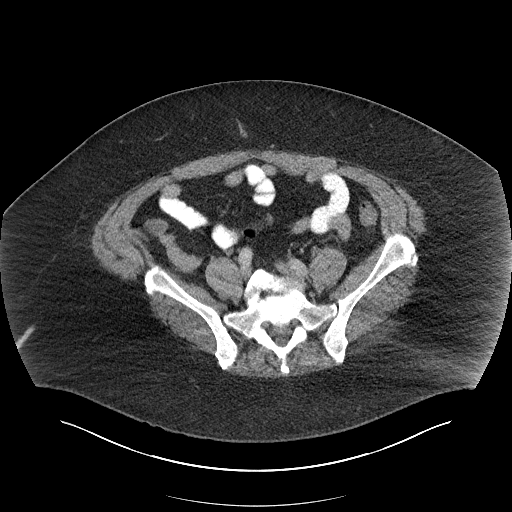
[im 54/96  soft-tissue]
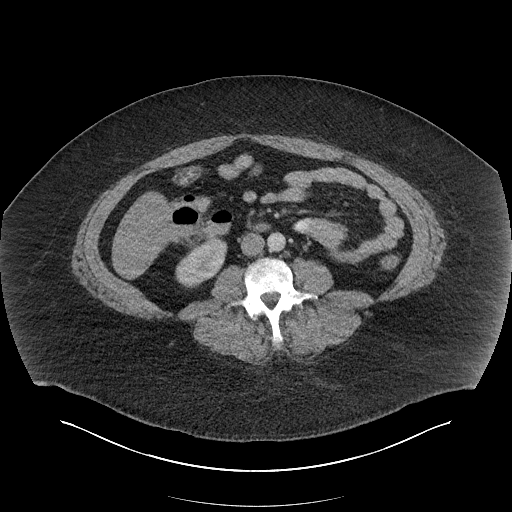
[im 60/96  soft-tissue]
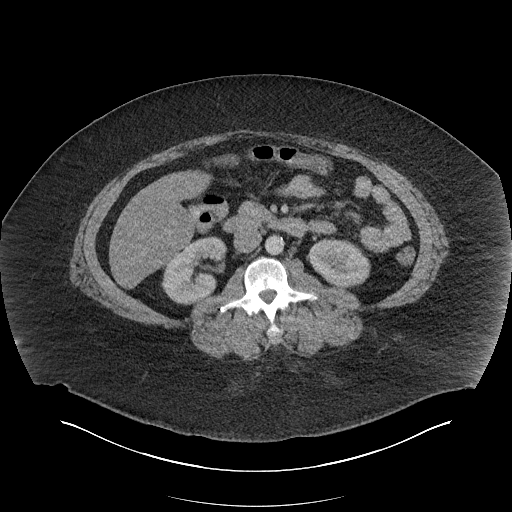
[im 60/96  bone]
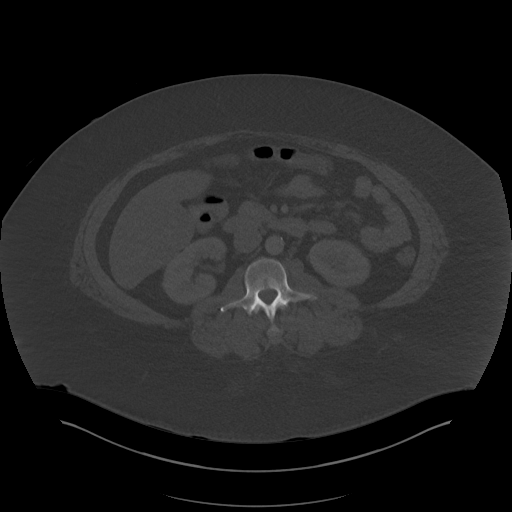
[im 66/96  soft-tissue]
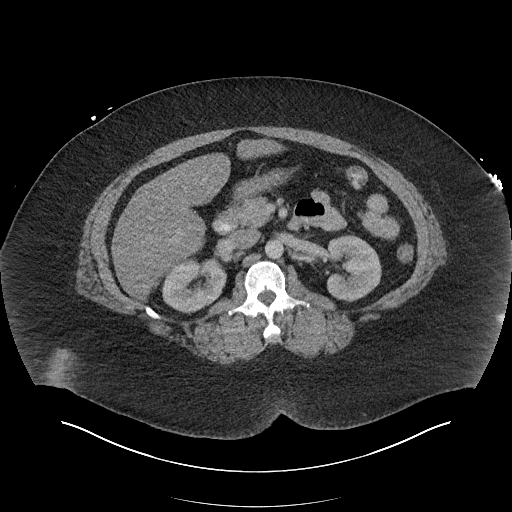
[im 72/96  soft-tissue]
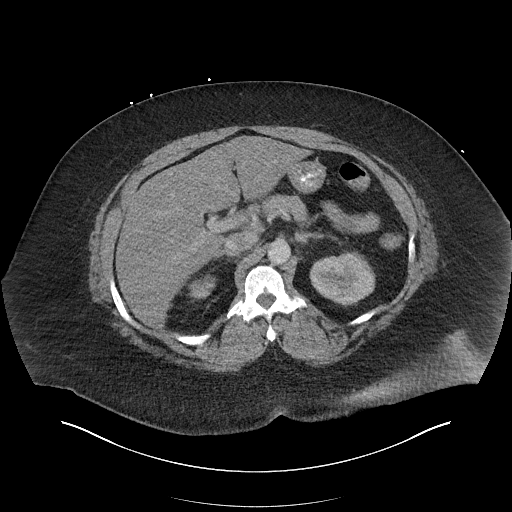
[im 78/96  soft-tissue]
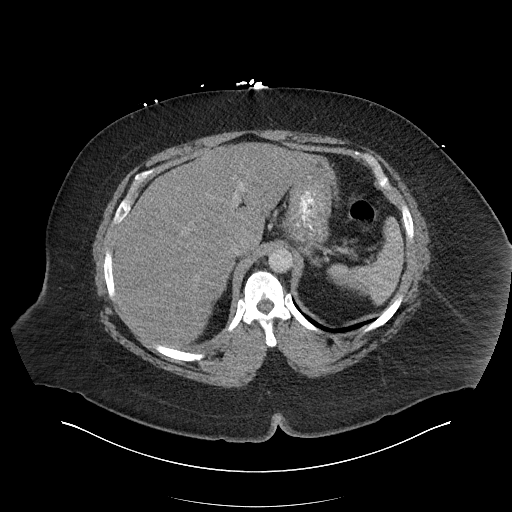
[im 84/96  soft-tissue]
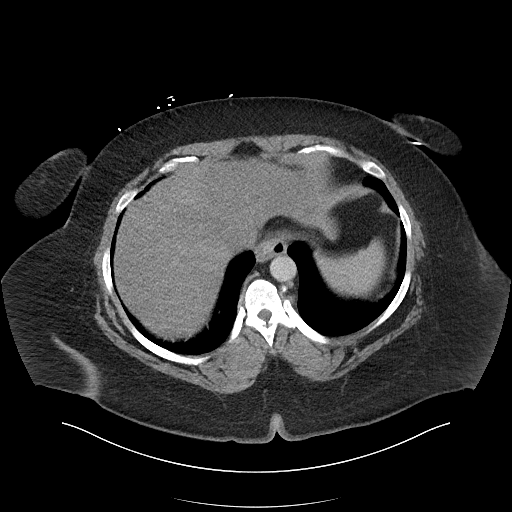
[im 90/96  soft-tissue]
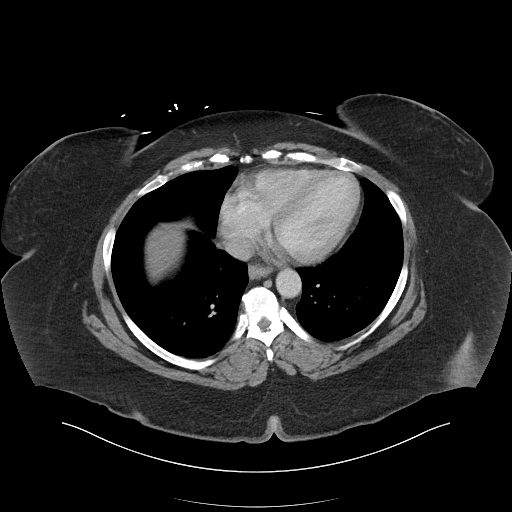

[Series 6: abdomen 3.0 mpr cor · coronal · 0.99mm/px · 3 of 122 slices shown]
[im 41/122  soft-tissue]
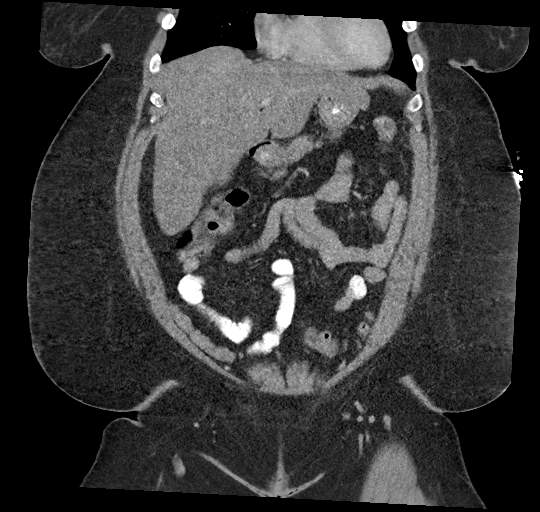
[im 54/122  soft-tissue]
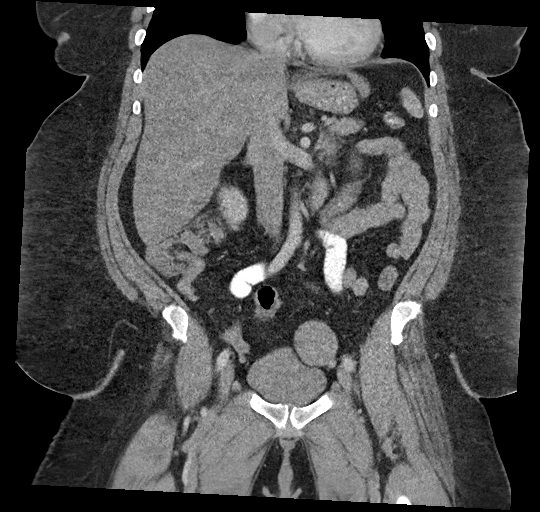
[im 68/122  soft-tissue]
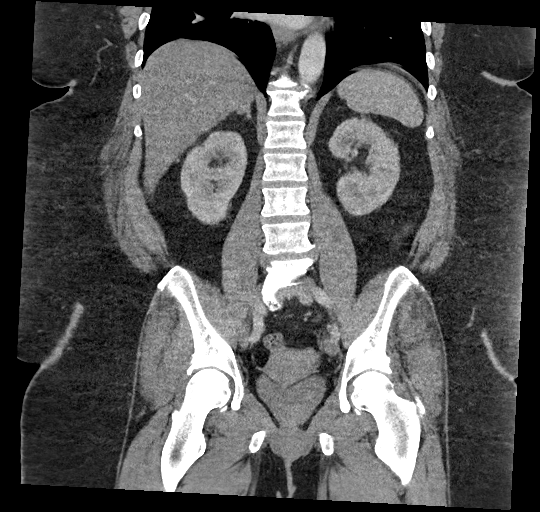

[17 of 46 positions shown; findings below may reference images not displayed]

FINDINGS: Lower chest: No acute abnormality.

Hepatobiliary: Hepatic steatosis. Postcholecystectomy. No evidence
of biliary ductal dilation.

Pancreas: Unremarkable. No pancreatic ductal dilatation or
surrounding inflammatory changes.

Spleen: Normal in size without focal abnormality.

Adrenals/Urinary Tract: Adrenal glands are unremarkable. Kidneys are
normal, without renal calculi, focal lesion, or hydronephrosis.
Bladder is unremarkable.

Stomach/Bowel: Stomach is within normal limits. Appendix appears
normal. No evidence of bowel wall thickening, distention, or
inflammatory changes.

Vascular/Lymphatic: No significant vascular findings are present. No
enlarged abdominal or pelvic lymph nodes.

Reproductive: Leiomyomatous uterus.  No evidence of adnexal masses.

Other: No abdominal wall hernia or abnormality. No abdominopelvic
ascites.

Musculoskeletal: L4-L5 and L5-S1 spondylosis.
IMPRESSION: Hepatic steatosis.

No evidence of acute abnormalities within the solid abdominal
organs.

Leiomyomatous uterus.

L4-L5 and L5-S1 spondylosis.

## 2019-09-11 ENCOUNTER — Inpatient Hospital Stay: Payer: Medicare Other | Attending: Hematology | Admitting: Hematology

## 2019-09-11 ENCOUNTER — Other Ambulatory Visit: Payer: Self-pay

## 2019-09-11 VITALS — BP 137/79 | HR 106 | Temp 97.5°F | Resp 18 | Ht 66.0 in | Wt 329.2 lb

## 2019-09-11 DIAGNOSIS — C7A010 Malignant carcinoid tumor of the duodenum: Secondary | ICD-10-CM | POA: Diagnosis present

## 2019-09-11 DIAGNOSIS — I110 Hypertensive heart disease with heart failure: Secondary | ICD-10-CM | POA: Diagnosis not present

## 2019-09-11 DIAGNOSIS — Z8673 Personal history of transient ischemic attack (TIA), and cerebral infarction without residual deficits: Secondary | ICD-10-CM | POA: Diagnosis not present

## 2019-09-11 DIAGNOSIS — E119 Type 2 diabetes mellitus without complications: Secondary | ICD-10-CM | POA: Diagnosis not present

## 2019-09-11 DIAGNOSIS — Z79899 Other long term (current) drug therapy: Secondary | ICD-10-CM | POA: Diagnosis not present

## 2019-09-11 DIAGNOSIS — I10 Essential (primary) hypertension: Secondary | ICD-10-CM | POA: Insufficient documentation

## 2019-09-11 DIAGNOSIS — Z794 Long term (current) use of insulin: Secondary | ICD-10-CM | POA: Diagnosis not present

## 2019-09-11 DIAGNOSIS — R0602 Shortness of breath: Secondary | ICD-10-CM | POA: Diagnosis not present

## 2019-09-11 NOTE — Progress Notes (Signed)
HEMATOLOGY/ONCOLOGY CONSULTATION NOTE  Date of Service: 09/11/2019  Patient Care Team: Cleophas Dunker, DO as PCP - General (Family Medicine) Sueanne Margarita, MD as PCP - Cardiology (Cardiology) Ernest Haber, MD (Family Medicine) Brunetta Genera, MD as Consulting Physician (Hematology) Jonnie Finner, RN as Oncology Nurse Navigator Laurance Flatten, Bosie Helper, LCSW as Social Worker (Licensed Clinical Social Worker)  CHIEF COMPLAINTS/PURPOSE OF CONSULTATION:  Newly Diagnosed Duodenal Carcinoid  HISTORY OF PRESENTING ILLNESS:  Deborah [name] is a wonderful 54 y.o. female who has been referred to [place] by Dr Nira Conn Krystal Eaton for evaluation and management of newly diagnosed Duodenal Carcinoid. The pt reports that she is doing well overall.   The pt reports that she is having abdominal pain and has not been able to have a bowel movement today, but she has continued eating well. Pt believes that her abdominal pain is due diabetic gastroparesis but claims that she has not spoken explicitly regarding this with her Gastroenterologist . She has had SOB, but feels that it could be caused by her CHF. Pt has previously had chronic diarrhea and notes that her Diabetes has not been well-controlled.   Of note prior to the patient's visit today, pt has had CT Abd ([id number]) completed on 08/03/2019 with results revealing "1. A 15 mm linear radiopaque metallic density in the region of the duodenal bulb, likely an ingested foreign object. No bowel obstruction or active inflammation in the visualized abdomen. No evidence of perforation. 2. Fatty liver. 3. Aortic Atherosclerosis (ICD10-I70.0)."  Pt has had Upper Endoscopy completed on 08/02/2019 with results revealing "- Z-line regular, 36 cm from the incisors. - Normal mucosa was found in the entire stomach. - Non-bleeding duodenal ulcer with no stigmata of bleeding. - A single duodenal polyp. Resected and retrieved. Clip was placed. - Normal  second portion of the duodenum."   Pt has had Colonoscopy completed on 07/30/2019 with results revealing "- Preparation of the colon was fair. - The examined portion of the ileum was normal. - Two 2 to 3 mm polyps in the descending colon. Biopsied. - One 10 mm polyp in the proximal sigmoid colon, removed with a hot snare. Resected and retrieved. - Internal hemorrhoids."  Pt has had Duodenal Polypectomy Surgical Pathology ([id number]) completed on 07/27/2019 with results revealing "DUODENAL BULB, POLYPECTOMY: - Well-differentiated neuroendocrine (carcinoid) tumor."  Pt has had Colonic Polypectomy Surgical Pathology ([id number]) completed on 07/30/2019 with results revealing "A. COLON, DESCENDING, POLYPECTOMY: - Tubular adenoma. - No high-grade dysplasia or carcinoma. B. COLON, SIGMOID, POLYPECTOMY: - Tubular adenoma. - No high-grade dysplasia or carcinoma."  Pt has had Duodenal Polypectomy ([id number]) completed on  08/02/2019 with results revealing "DUODENUM, POLYPECTOMY: - Low grade neuroendocrine tumor (carcinoid tumor), broadly present at multiple tissue edges. - Lymphovascular invasion is identified."  Most recent lab results (08/07/2019) of CBC w/diff and CMP is as follows: all values are WNL except for RBC at 3.06, Hgb at 8.2, HCT at 27.9, MCHC at 29.4, Glucose at 157, BUN at 30, Creatinine at 1.28, Calcium at 8.4, Total Protein at 6.4, Albumin at 2.7, AST at 11, GFR Est Af Am at 54.  On review of systems, pt reports SOB, abdominal pain and denies unexpected weight loss, chest pain, new bone pain, low appetite, rashes and any other symptoms.   On PMHx the pt reports Gastroparesis, Diabetes, HLD, HTN, Chronic systolic CHF.  INTERVAL HISTORY:  Deborah [name] is a wonderful 54 y.o. female who is here for evaluation and management  of Duodenal Carcinoid. We are joined today by the pt's mother. The patient was last seen in the hospital on 08/08/2019. The pt reports that she  is doing well overall.  The pt reports that she was back in the hospital with abdominal pain and was discharged last Friday. She notes that she was not vomiting during her last hospital visit and does not believe that her symptoms were caused by gastroparesis.   Pt is currently experiencing lower abdominal pain and leg swelling. Pt is on Torsemide for her leg swelling, which was being managed by her PCP. Her PCP has recently moved on and she has not been transitioned to another primary care provider. She is also not following with a Cardiologist.   Of note since the patient's last visit, pt has had CT Abd/Pel ([id number]) completed on 08/23/2019 with results revealing "1. Stable uterine fibroid. 2. No other abnormality seen in the abdomen or pelvis."  Lab results (09/03/19) of CBC w/diff and BMP is as follows: all values are WNL except for WBC at 12.8K, RBC at 3.56, Hgb at 9.6, HCT at 30.4, PLT at 451K, Neutro Abs at 9.6K, Glucose at 371, BUN at 33, Creatinine at 1.81, GFR Est Af Am at 36. 09/03/2019 Magnesium at 1.4  On review of systems, pt reports lower abdominal pain, leg swelling and denies back pain, constipation and any other symptoms.    MEDICAL HISTORY:  Past Medical History:  Diagnosis Date  . Acute back pain with sciatica, left   . Acute back pain with sciatica, right   . AKI (acute kidney injury) ([place])   . Anemia, unspecified   . Chronic pain   . Chronic systolic CHF (congestive heart failure) ([place])   . Diabetes mellitus   . Esophageal reflux   . Fibromyalgia   . Gastric ulcer   . Gastroparesis   . Gout   . Hyperlipidemia   . Hypertension   . Lumbosacral stenosis   . NICM (nonischemic cardiomyopathy) ([place])   . Obesity   . PAF (paroxysmal atrial fibrillation) ([place])   . Stroke ([place]) 02/2011  . Vitamin B12 deficiency anemia     SURGICAL HISTORY: Past Surgical History:  Procedure Laterality Date  . BIOPSY  07/27/2019   Procedure: BIOPSY;  Surgeon: Clarene Essex, MD;   Location: [facility];  Service: Endoscopy;;  . BIOPSY  07/30/2019   Procedure: BIOPSY;  Surgeon: Otis Brace, MD;  Location: [facility];  Service: Gastroenterology;;  . CATARACT EXTRACTION  01/2014  . CHOLECYSTECTOMY    . COLONOSCOPY WITH PROPOFOL N/A 07/30/2019   Procedure: COLONOSCOPY WITH PROPOFOL;  Surgeon: Otis Brace, MD;  Location: [facility];  Service: Gastroenterology;  Laterality: N/A;  . ESOPHAGOGASTRODUODENOSCOPY N/A 07/27/2019   Procedure: ESOPHAGOGASTRODUODENOSCOPY (EGD);  Surgeon: Clarene Essex, MD;  Location: [facility];  Service: Endoscopy;  Laterality: N/A;  . ESOPHAGOGASTRODUODENOSCOPY (EGD) WITH PROPOFOL N/A 08/02/2019   Procedure: ESOPHAGOGASTRODUODENOSCOPY (EGD) WITH PROPOFOL;  Surgeon: Otis Brace, MD;  Location: [facility];  Service: Gastroenterology;  Laterality: N/A;  . HEMOSTASIS CLIP PLACEMENT  08/02/2019   Procedure: HEMOSTASIS CLIP PLACEMENT;  Surgeon: Otis Brace, MD;  Location: [facility];  Service: Gastroenterology;;  . POLYPECTOMY  07/30/2019   Procedure: POLYPECTOMY;  Surgeon: Otis Brace, MD;  Location: [facility];  Service: Gastroenterology;;  . POLYPECTOMY  08/02/2019   Procedure: POLYPECTOMY;  Surgeon: Otis Brace, MD;  Location: [facility];  Service: Gastroenterology;;    SOCIAL HISTORY: Social History   Socioeconomic History  . Marital status: Single  Spouse name: Not on file  . Number of children: Not on file  . Years of education: Not on file  . Highest education level: Not on file  Occupational History  . Not on file  Tobacco Use  . Smoking status: Never Smoker  . Smokeless tobacco: Never Used  Vaping Use  . Vaping Use: Never used  Substance and Sexual Activity  . Alcohol use: No  . Drug use: No  . Sexual activity: Not Currently    Birth control/protection: None  Other Topics Concern  . Not on file  Social History Narrative   ** Merged History Encounter **       Social Determinants  of Health   Financial Resource Strain:   . Difficulty of Paying Living Expenses:   Food Insecurity:   . Worried About Charity fundraiser in the Last Year:   . Arboriculturist in the Last Year:   Transportation Needs:   . Film/video editor (Medical):   [name] Lack of Transportation (Non-Medical):   Physical Activity:   . Days of Exercise per Week:   . Minutes of Exercise per Session:   Stress:   . Feeling of Stress :   Social Connections:   . Frequency of Communication with Friends and Family:   . Frequency of Social Gatherings with Friends and Family:   . Attends Religious Services:   . Active Member of Clubs or Organizations:   . Attends Archivist Meetings:   [name] Marital Status:   Intimate Partner Violence:   . Fear of Current or Ex-Partner:   . Emotionally Abused:   [name] Physically Abused:   . Sexually Abused:     FAMILY HISTORY: Family History  Problem Relation Age of Onset  . Diabetes Mother   . Diabetes Father   . Heart disease Father   . Diabetes Sister   . Congestive Heart Failure Sister 50  . Diabetes Brother     ALLERGIES:  is allergic to contrast media [iodinated diagnostic agents], diazepam, isovue [iopamidol], lisinopril, penicillins, acetaminophen, tolmetin, aspirin, metoclopramide, nsaids, and tramadol.  MEDICATIONS:  Current Outpatient Medications  Medication Sig Dispense Refill  . albuterol (PROVENTIL) (2.5 MG/3ML) 0.083% nebulizer solution Take 3 mLs (2.5 mg total) by nebulization every 6 (six) hours as needed for wheezing or shortness of breath. 150 mL 1  . ALPRAZolam (XANAX) 1 MG tablet Take 1 tablet (1 mg total) by mouth 2 (two) times daily as needed for anxiety. 60 tablet 0  . atorvastatin (LIPITOR) 10 MG tablet Take 1 tablet (10 mg total) by mouth daily. 30 tablet 11  . blood glucose meter kit and supplies KIT Dispense based on patient and insurance preference. Use up to four times daily as directed. (FOR ICD-9 250.00, 250.01). 1 each 0  .  cetirizine (ZYRTEC) 10 MG tablet Take 1 tablet (10 mg total) by mouth daily. 30 tablet 11  . DULoxetine HCl 40 MG CPEP TAKE ONE CAPSULE BY MOUTH TWICE DAILY (Patient taking differently: Take 40 mg by mouth in the morning and at bedtime. ) 60 capsule 2  . ELIQUIS 5 MG TABS tablet TAKE 1 TABLET BY MOUTH 2 (TWO) TIMES DAILY. 60 tablet 2  . erythromycin (E-MYCIN) 250 MG tablet Take 1 tablet (250 mg total) by mouth 3 (three) times daily with meals. 90 tablet 0  . ferrous sulfate 325 (65 FE) MG tablet Take 1 tablet (325 mg total) by mouth daily with breakfast. 30 tablet 0  .  fluticasone (FLONASE) 50 MCG/ACT nasal spray Place 2 sprays into both nostrils daily as needed for allergies or rhinitis. 16 g 2  . hydrALAZINE (APRESOLINE) 25 MG tablet Take 1 tablet (25 mg total) by mouth 3 (three) times daily. For blood pressure 90 tablet 2  . insulin aspart (NOVOLOG FLEXPEN) 100 UNIT/ML FlexPen Inject 15 Units into the skin 3 (three) times daily with meals. (Patient taking differently: Inject 30 Units into the skin 3 (three) times daily with meals. ) 15 mL 0  . isosorbide mononitrate (IMDUR) 60 MG 24 hr tablet Take 1 tablet (60 mg total) by mouth daily. 30 tablet 1  . LANTUS SOLOSTAR 100 UNIT/ML Solostar Pen Inject 45 Units into the skin at bedtime.    [name] loperamide (IMODIUM) 2 MG capsule Take 2 mg by mouth 4 (four) times daily as needed for diarrhea or loose stools.    . magnesium oxide (MAG-OX) 400 (241.3 Mg) MG tablet Take 1 tablet (400 mg total) by mouth 2 (two) times daily. 60 tablet 0  . melatonin 3 MG TABS tablet TAKE 2 TABLETS BY MOUTH AT BEDTIME. 60 tablet 2  . metoprolol succinate (TOPROL-XL) 25 MG 24 hr tablet TAKE ONE TABLET BY MOUTH ONCE DAILY 30 tablet 2  . nitroGLYCERIN (NITROSTAT) 0.4 MG SL tablet Place 1 tablet (0.4 mg total) under the tongue every 5 (five) minutes as needed for chest pain. 30 tablet 1  . ondansetron (ZOFRAN-ODT) 4 MG disintegrating tablet Take 1 tablet (4 mg total) by mouth every 8  (eight) hours as needed for nausea or vomiting. 30 tablet 0  . pantoprazole (PROTONIX) 40 MG tablet TAKE ONE TABLET BY MOUTH TWICE DAILY 60 tablet 3  . torsemide (DEMADEX) 20 MG tablet Take 2 tablets (40 mg total) by mouth daily. Hold if having significant vomiting     No current facility-administered medications for this visit.    REVIEW OF SYSTEMS:    10 Point review of Systems was done is negative except as noted above.  PHYSICAL EXAMINATION: ECOG PERFORMANCE STATUS: 2 - Symptomatic, <50% confined to bed  . Vitals:   09/11/19 1122  BP: 137/79  Pulse: (!) 106  Resp: 18  Temp: (!) 97.5 F (36.4 C)  SpO2: 99%   Filed Weights   09/11/19 1122  Weight: (!) 329 lb 3.2 oz (149.3 kg)   .Body mass index is 53.13 kg/m.  Exam was given in a wheelchair   GENERAL:alert, in no acute distress and comfortable SKIN: no acute rashes, no significant lesions EYES: conjunctiva are pink and non-injected, sclera anicteric OROPHARYNX: MMM, no exudates, no oropharyngeal erythema or ulceration NECK: supple, no JVD LYMPH:  no palpable lymphadenopathy in the cervical, axillary or inguinal regions LUNGS: clear to auscultation b/l with normal respiratory effort HEART: regular rate & rhythm ABDOMEN:  normoactive bowel sounds , non tender, not distended. Extremity: 3+ pedal edema PSYCH: alert & oriented x 3 with fluent speech NEURO: no focal motor/sensory deficits  LABORATORY DATA:  I have reviewed the data as listed  . CBC Latest Ref Rng & Units 09/03/2019 08/31/2019 08/30/2019  WBC 3.4 - 10.8 x10E3/uL 12.8(H) 12.4(H) 13.4(H)  Hemoglobin 11.1 - 15.9 g/dL 9.6(L) 8.5(L) 8.6(L)  Hematocrit 34.0 - 46.6 % 30.4(L) 27.4(L) 27.8(L)  Platelets 150 - 450 x10E3/uL 451(H) 394 424(H)    . CMP Latest Ref Rng & Units 09/03/2019 08/31/2019 08/30/2019  Glucose 65 - 99 mg/dL 371(H) 224(H) 226(H)  BUN 6 - 24 mg/dL 33(H) 28(H) 24(H)  Creatinine 0.57 -  1.00 mg/dL 1.81(H) 1.51(H) 1.49(H)  Sodium 134 - 144  mmol/L 136 137 135  Potassium 3.5 - 5.2 mmol/L 4.1 3.3(L) 3.2(L)  Chloride 96 - 106 mmol/L 99 99 98  CO2 20 - 29 mmol/L '20 23 28  ' Calcium 8.7 - 10.2 mg/dL 9.3 9.2 9.0  Total Protein 6.5 - 8.1 g/dL - - -  Total Bilirubin 0.3 - 1.2 mg/dL - - -  Alkaline Phos 38 - 126 U/L - - -  AST 15 - 41 U/L - - -  ALT 0 - 44 U/L - - -     RADIOGRAPHIC STUDIES: I have personally reviewed the radiological images as listed and agreed with the findings in the report. CT ABDOMEN PELVIS WO CONTRAST  Result Date: 08/23/2019 CLINICAL DATA:  Acute epigastric abdominal pain. EXAM: CT ABDOMEN AND PELVIS WITHOUT CONTRAST TECHNIQUE: Multidetector CT imaging of the abdomen and pelvis was performed following the standard protocol without IV contrast. COMPARISON:  Aug 03, 2019.  Jul 22, 2019. FINDINGS: Lower chest: No acute abnormality. Hepatobiliary: No focal liver abnormality is seen. Status post cholecystectomy. No biliary dilatation. Pancreas: Unremarkable. No pancreatic ductal dilatation or surrounding inflammatory changes. Spleen: Normal in size without focal abnormality. Adrenals/Urinary Tract: Adrenal glands are unremarkable. Kidneys are normal, without renal calculi, focal lesion, or hydronephrosis. Bladder is unremarkable. Stomach/Bowel: Stomach is within normal limits. Appendix appears normal. No evidence of bowel wall thickening, distention, or inflammatory changes. Vascular/Lymphatic: No significant vascular findings are present. No enlarged abdominal or pelvic lymph nodes. Reproductive: No adnexal abnormality is noted. Stable uterine fibroid is noted. Other: No abdominal wall hernia or abnormality. No abdominopelvic ascites. Musculoskeletal: No acute or significant osseous findings. IMPRESSION: 1. Stable uterine fibroid. 2. No other abnormality seen in the abdomen or pelvis. Electronically Signed   By: Marijo Conception M.D.   On: 08/23/2019 16:11   DG Chest 2 View  Result Date: 08/19/2019 CLINICAL DATA:  Chest  pain EXAM: CHEST - 2 VIEW COMPARISON:  07/21/2019 FINDINGS: Cardiomegaly. Both lungs are clear. Disc degenerative disease of the thoracic spine. IMPRESSION: Cardiomegaly without acute abnormality of the lungs. Electronically Signed   By: Eddie Candle M.D.   On: 08/19/2019 15:56   VAS [place] LOWER EXTREMITY VENOUS (DVT)  Result Date: 08/23/2019  Lower Venous DVTStudy Indications: Swelling.  Limitations: Body habitus and pain with compression. Comparison Study: Prior LLE venous duplex from 05/30/15 and RLE venous duplex                   from 07/19/14, are available for comparison Performing Technologist: Sharion Dove RVS  Examination Guidelines: A complete evaluation includes B-mode imaging, spectral Doppler, color Doppler, and power Doppler as needed of all accessible portions of each vessel. Bilateral testing is considered an integral part of a complete examination. Limited examinations for reoccurring indications may be performed as noted. The reflux portion of the exam is performed with the patient in reverse Trendelenburg.  +---------+---------------+---------+-----------+----------+-------------------+ RIGHT    CompressibilityPhasicitySpontaneityPropertiesThrombus Aging      +---------+---------------+---------+-----------+----------+-------------------+ CFV      Full           Yes      Yes                                      +---------+---------------+---------+-----------+----------+-------------------+ SFJ      Full                                                             +---------+---------------+---------+-----------+----------+-------------------+  FV Prox  Full                                                             +---------+---------------+---------+-----------+----------+-------------------+ FV Mid   Full                                                             +---------+---------------+---------+-----------+----------+-------------------+ FV Distal                Yes      Yes                  patent by color and                                                       Doppler             +---------+---------------+---------+-----------+----------+-------------------+ PFV      Full                                                             +---------+---------------+---------+-----------+----------+-------------------+ POP      Full           Yes      Yes                                      +---------+---------------+---------+-----------+----------+-------------------+ PTV      Full           Yes      Yes                                      +---------+---------------+---------+-----------+----------+-------------------+ PERO                                                  Not visualized      +---------+---------------+---------+-----------+----------+-------------------+   +---------+---------------+---------+-----------+----------+-------------------+ LEFT     CompressibilityPhasicitySpontaneityPropertiesThrombus Aging      +---------+---------------+---------+-----------+----------+-------------------+ CFV      Full           Yes      Yes                                      +---------+---------------+---------+-----------+----------+-------------------+ SFJ      Full                                                             +---------+---------------+---------+-----------+----------+-------------------+  FV Prox  Full                                                             +---------+---------------+---------+-----------+----------+-------------------+ FV Mid   Full                                                             +---------+---------------+---------+-----------+----------+-------------------+ FV Distal               Yes      Yes                  patent by color and                                                       Doppler              +---------+---------------+---------+-----------+----------+-------------------+ POP      Full           Yes      Yes                                      +---------+---------------+---------+-----------+----------+-------------------+ PTV      Full                                                             +---------+---------------+---------+-----------+----------+-------------------+ PERO                                                  Not visualized      +---------+---------------+---------+-----------+----------+-------------------+     Summary: RIGHT: - Findings appear essentially unchanged compared to previous examination. - There is no evidence of deep vein thrombosis in the lower extremity. However, portions of this examination were limited- see technologist comments above.  LEFT: - Findings appear essentially unchanged compared to previous examination. - There is no evidence of deep vein thrombosis in the lower extremity. However, portions of this examination were limited- see technologist comments above.  - A cystic structure is found in the popliteal fossa.  *See table(s) above for measurements and observations. Electronically signed by Monica Martinez MD on 08/23/2019 at 3:20:02 PM.    Final     ASSESSMENT & PLAN:   54 year old African-American female with multiple medical comorbidities with  #1 Newly diagnosed low-grade well-differentiated duodenal neuroendocrine/carcinoid tumor noted on EGD in duodenal bulb polyp endoscopic biopsy on 07/27/2019-done by Dr. Watt Climes. Repeat EGD was done on 08/02/2019-showed 1 nonbleeding superficial duodenal ulcer with no stigmata of bleeding in the duodenal bulb  measuring 4 mm.  A single 12 mm sessile polyp with bleeding was noted in the duodenal bulb.  Polyp was removed with piecemeal technique using hot snare.  Hemostatic clip was placed. Pathology from 08/02/2019 confirmed low-grade carcinoid tumor with lymphovascular invasion.  Tumor  was broadly present with multiple tissue edges suggesting likely involvement of margins.  PLAN: -Discussed pt labwork today, 09/03/19; all values are WNL except for WBC at 12.8K, RBC at 3.56, Hgb at 9.6, HCT at 30.4, PLT at 451K, Neutro Abs at 9.6K, Glucose at 371, BUN at 33, Creatinine at 1.81, GFR Est Af Am at 36. -Discussed 09/03/2019 Magnesium at 1.4 -Advised pt that we cannot confirm that the margins were negative, as her duodenal polyp was not resected intact   -Advised pt that the Surgical Pathology Report on her Duodenal Carcinoid tumor showed lymphovascular invasion, which is an additional risk factor for the spread of disease.  -Advised pt that if there is no visible disease on PET study we could refer her to surgery for additional resection or we could continue to monitor with intermittent scans and Endoscopies.  -Advised pt that her comorbidities will have to be considered before she is cleared for surgery including: uncontrolled diabetes, gastroparesis, heart disease, & kidney disease.  -Will get PET DOTATATE study & labs in 1 week  -Will refer pt to Dr. Barry Dienes for Surgical Consultation in 2 weeks  -Recommend pt f/u with her Primary Care Clinic for assistance with chronic pain management & care coordination -Will see back in 3 weeks    FOLLOW UP: Labs in 1 week PET dotatate study in 1 week Referal to Dr Barry Dienes -[facility] in 2 weeks RTC with Dr Irene Limbo in 3 weeks   All of the patients questions were answered with apparent satisfaction. The patient knows to call the clinic with any problems, questions or concerns.  I spent 30 mins counseling the patient face to face. The total time spent in the appointment was 40 minutes and more than 50% was on counseling and direct patient cares.    Sullivan Lone MD [place] AAHIVMS [facility] [place] Hematology/Oncology Physician [facility]  (Office):       [phone] (Work cell):  [phone] (Fax):           [phone]  09/11/2019 12:19  PM  I, Yevette Edwards, am acting as a scribe for Dr. Sullivan Lone.   .I have reviewed the above documentation for accuracy and completeness, and I agree with the above. Brunetta Genera MD

## 2019-09-14 ENCOUNTER — Other Ambulatory Visit: Payer: Self-pay

## 2019-09-14 ENCOUNTER — Telehealth: Payer: Self-pay | Admitting: Hematology

## 2019-09-14 ENCOUNTER — Ambulatory Visit: Payer: Medicare Other

## 2019-09-14 NOTE — Telephone Encounter (Signed)
Scheduled per los, patient has been called and notified. 

## 2019-09-14 NOTE — Chronic Care Management (AMB) (Signed)
Care Management   Follow Up Note   09/14/2019 Name: Deborah [name] MRN: [id number] DOB: August 09, 1965  Referred by: Cleophas Dunker, DO Reason for referral : No chief complaint on file.   Deborah [name] is a 54 y.o. year old female who is a primary care patient of Cleophas Dunker, DO. The care management team was consulted for assistance with care management and care coordination needs.    Review of patient status, including review of consultants reports, relevant laboratory and other test results, and collaboration with appropriate care team members and the patient's provider was performed as part of comprehensive patient evaluation and provision of chronic care management services.    SDOH (Social Determinants of Health) assessments performed: No See Care Plan activities for detailed interventions related to [facility])     Advanced Directives: See Care Plan and Vynca application for related entries.   Goals Addressed              This Visit's Progress   .  I been in the hospital 2-3 times for my Blood sugar (pt-stated)        CARE PLAN ENTRY (see longtitudinal plan of care for additional care plan information)  Objective:  Lab Results  Component Value Date   HGBA1C 8.7 (H) 07/22/2019 .   Lab Results  Component Value Date   CREATININE 1.81 (H) 09/03/2019   CREATININE 1.51 (H) 08/31/2019   CREATININE 1.49 (H) 08/30/2019 .   [name] No results found for: EGFR  Current Barriers:  [name] Knowledge Deficits related to basic Diabetes pathophysiology and self care/management   Case Manager Clinical Goal(s):  Over the next 30 days, patient will demonstrate improved adherence to prescribed treatment plan for diabetes self care/management as evidenced by: remaining out of the hospital   Interventions:  . Provided education to patient about basic DM disease process . Reviewed medications with patient and discussed importance of medication adherence . Discussed plans  with patient for ongoing care management follow up and provided patient with direct contact information for care management team . Provided patient with written educational materials related to hypo and hyperglycemia and importance of correct treatment . Advised patient, providing education and rationale, to check cbg and record, calling the office for findings outside established parameters.   . The patient states that she never has lows.  She only had a low 1 time and then she was clammy and disoriented. . When her blood sugar is high, she will start to vomit.  The highest she stated was in the 400's. . I would like to send the patient some educational material on diabetes Living well with Diabetes, Cirrhosis, Calendar  09/14/19 . Patient states that she is not feeling well she is having abdominal pain.  She states that she was only given 7 days of pain medicine and does not have anymore.  She states that she will go to the hospital if it gets worse. . I advised her to notify Oncology office of her pain. She verbalized understanding.  She denies any nausea or vomiting. She states that she has a "little appetite" and was able to keep fluids down.  She can urinate and have a bowel movement.  She does state that her energy level is low. . She said that her blood sugars levels are still high but was not able to give me a number because she states she did not check it today.  Patient Self Care Activities:  . Self administers oral  medications as prescribed . Attends all scheduled provider appointments  Please see past updates related to this goal by clicking on the "Past Updates" button in the selected goal          The care management team will reach out to the patient again over the next 14 days.   Lazaro Arms RN, BSN, [facility] Care Management Coordinator [place] Phone: [phone] Fax: [phone]

## 2019-09-14 NOTE — Patient Instructions (Signed)
Visit Information  Goals Addressed              This Visit's Progress   .  I been in the hospital 2-3 times for my Blood sugar (pt-stated)        CARE PLAN ENTRY (see longtitudinal plan of care for additional care plan information)  Objective:  Lab Results  Component Value Date   HGBA1C 8.7 (H) 07/22/2019 .   Lab Results  Component Value Date   CREATININE 1.81 (H) 09/03/2019   CREATININE 1.51 (H) 08/31/2019   CREATININE 1.49 (H) 08/30/2019 .   [name] No results found for: EGFR  Current Barriers:  [name] Knowledge Deficits related to basic Diabetes pathophysiology and self care/management   Case Manager Clinical Goal(s):  Over the next 30 days, patient will demonstrate improved adherence to prescribed treatment plan for diabetes self care/management as evidenced by: remaining out of the hospital   Interventions:  . Provided education to patient about basic DM disease process . Reviewed medications with patient and discussed importance of medication adherence . Discussed plans with patient for ongoing care management follow up and provided patient with direct contact information for care management team . Provided patient with written educational materials related to hypo and hyperglycemia and importance of correct treatment . Advised patient, providing education and rationale, to check cbg and record, calling the office for findings outside established parameters.   . The patient states that she never has lows.  She only had a low 1 time and then she was clammy and disoriented. . When her blood sugar is high, she will start to vomit.  The highest she stated was in the 400's. . I would like to send the patient some educational material on diabetes Living well with Diabetes, Cirrhosis, Calendar  09/14/19 . Patient states that she is not feeling well she is having abdominal pain.  She states that she was only given 7 days of pain medicine and does not have anymore.  She states that she  will go to the hospital if it gets worse. . I advised her to notify Oncology office of her pain. She verbalized understanding.  She denies any nausea or vomiting. She states that she has a "little appetite" and was able to keep fluids down.  She can urinate and have a bowel movement.  She does state that her energy level is low. . She said that her blood sugars levels are still high but was not able to give me a number because she states she did not check it today.  Patient Self Care Activities:  . Self administers oral medications as prescribed . Attends all scheduled provider appointments  Please see past updates related to this goal by clicking on the "Past Updates" button in the selected goal         Ms. Tetro was given information about Care Management services today including:  1. Care Management services include personalized support from designated clinical staff supervised by her physician, including individualized plan of care and coordination with other care providers 2. 24/7 contact phone numbers for assistance for urgent and routine care needs. 3. The patient may stop CCM services at any time (effective at the end of the month) by phone call to the office staff.  Patient agreed to services and verbal consent obtained.   The patient verbalized understanding of instructions provided today and declined a print copy of patient instruction materials.   The care management team will reach out to the  patient again over the next 14 days.   Lazaro Arms RN, BSN, [facility] Care Management Coordinator [place] Phone: [phone] Fax: [phone]

## 2019-09-18 ENCOUNTER — Other Ambulatory Visit: Payer: Self-pay

## 2019-09-18 ENCOUNTER — Inpatient Hospital Stay: Payer: Medicare Other

## 2019-09-18 DIAGNOSIS — C7A010 Malignant carcinoid tumor of the duodenum: Secondary | ICD-10-CM

## 2019-09-19 ENCOUNTER — Ambulatory Visit: Payer: Medicare Other | Admitting: Licensed Clinical Social Worker

## 2019-09-19 DIAGNOSIS — Z7189 Other specified counseling: Secondary | ICD-10-CM

## 2019-09-19 DIAGNOSIS — Z741 Need for assistance with personal care: Secondary | ICD-10-CM

## 2019-09-19 LAB — NEURON-SPECIFIC ENOLASE(NSE), BLOOD: Neuron-specific Enolase, Serum: 7.2 ng/mL (ref 0.0–17.6)

## 2019-09-19 NOTE — Chronic Care Management (AMB) (Signed)
Care Management   Clinical Social Work Follow Up   09/19/2019 Name: STEELE LEDONNE MRN: [id number] DOB: 1965-03-29 Referred by: Cleophas Dunker, DO  Reason for referral : Care Coordination (F/U call)  Deborah [name] is a 54 y.o. year old female who is a primary care patient of Cleophas Dunker, DO.  Reason for follow-up: assess for barriers and progress with personal care services and ongoing chronic care needs .   Assessment: Patient continues to experience difficulty with meeting all of her ADL's.  Patient is making progress towards goal. States PCS aide started Monday and things are going well. Reports swelling in her legs and has an appointment with PCP tomorrow.  Patient would like continued follow-up from CCM LCSW. Plan:  1. Patient will talk to PCP tomorrow about getting a walker and [employer] recliner 2.    CCM RN will F/U with patient in 1 week and CCM LCSW will touch base with patient in 4 weeks  Advance Directive Status: not addressed during this encounter.  SDOH (Social Determinants of Health) assessments performed ; No needs identified   Goals Addressed            This Visit's Progress   . Connect with psychiatry   Not on track    [place] (see longitudinal plan of care for additional care plan information)  Current Barriers:  . Patient with Anxiety and Depression acknowledges deficits with connecting to mental health provider for medication management.  . Patient needs Support, Education, and Care Coordination in order to meet unmet mental health needs  . Patient continues to report that she would like to put this goal on the back for now and focus on her physical health Clinical Social Work Goal(s):  [name] Over the next 30 days, patient will work with SW to connect for ongoing counseling resources and psychiatry .  Interventions:  . Assessed patient's mental health needs and barriers to care . Discussed counseling options since she  does not want to move forward with psychiatry Patient Self Care Activities & Deficits:  . Patient is unable to independently navigate community resource options without care coordination support . Patient is not motivated for mental health treatment at this time. She wants to focus on her other health needs Please see past updates related to this goal by clicking on the "Past Updates" button in the selected goal      . I need a walker and recliner       [place] (see longitudinal plan of care for additional care plan information)  Current Barriers:  . Patient with chronic medical conditions states she needs a walker and a recliner so that she can raise her feet to prevent swelling,  . Acknowledges deficits and needs support, education and care coordination in order to meet this unmet need  . Patient needs order from PCP and route order to RN clinic. Clinical Goal(s):  Over the next 30 days, patient will receive DME and be able to safely meet needs.  Interventions: . Assessed patient needs, what they have used in the past and how currently meeting needs . Collaborated with PCP regarding patient needs and request  . Collaborated with appropriate clinical care team members regarding patient needs Patient Self Care Activities: . Patient is unable to independently navigate getting DME without care coordination support  Initial goal documentation      . personal care services   On track    [place] (  see longitudinal plan of care for additional care plan information)  Current Barriers & Progress:   . Patient with chronic medical conditions needs Support, Education, and Care Coordination to resolve unmet Personal Care needs . Patient contacted personal care agency in [facility] for start date . Patient reports PCS services started Monday July 12th  Clinical Social Work Goal(s):  [name] Over the next 30 to 45 days, patient will have personal care needs met as evident by having PCS  Aide in the home assisting with needs.  Interventions provided by LCSW : . Assessed needs, level of care concerns, basic eligibility and provided education on Personal Care Service process,  . Collaborate with primary care provider  and CCM RN ref patient's need . Also discussed additional support options with PACE program, patient in agreement to hear more information.  Verbal permission to make referral . PACE referral placed Patient has spoken with PACE and waiting on next steps ( Outreach to PACE intake waiting for F/U) . Spoke to patient about CAP program that will provide up to 8 hours of care per day. Provided verbal approval for LCSW to make referral. ( called [facility] to start CAP application). Patient Self Care Activities & Deficits:  . Patient is unable to perform ADLs independently without assistance  . Family/support system will continue to assist patient with meeting needs in addition to PCS  . Call [facility] with questions [phone] or [phone]  Please see past updates related to this goal by clicking on the "Past Updates" button in the selected goal        Outpatient Encounter Medications as of 09/19/2019  Medication Sig  . albuterol (PROVENTIL) (2.5 MG/3ML) 0.083% nebulizer solution Take 3 mLs (2.5 mg total) by nebulization every 6 (six) hours as needed for wheezing or shortness of breath.  . ALPRAZolam (XANAX) 1 MG tablet Take 1 tablet (1 mg total) by mouth 2 (two) times daily as needed for anxiety.  [name] atorvastatin (LIPITOR) 10 MG tablet Take 1 tablet (10 mg total) by mouth daily.  . blood glucose meter kit and supplies KIT Dispense based on patient and insurance preference. Use up to four times daily as directed. (FOR ICD-9 250.00, 250.01).  . cetirizine (ZYRTEC) 10 MG tablet Take 1 tablet (10 mg total) by mouth daily.  . DULoxetine HCl 40 MG CPEP TAKE ONE CAPSULE BY MOUTH TWICE DAILY (Patient taking differently: Take 40 mg by mouth in the morning and at  bedtime. )  . ELIQUIS 5 MG TABS tablet TAKE 1 TABLET BY MOUTH 2 (TWO) TIMES DAILY.  [name] erythromycin (E-MYCIN) 250 MG tablet Take 1 tablet (250 mg total) by mouth 3 (three) times daily with meals.  . ferrous sulfate 325 (65 FE) MG tablet Take 1 tablet (325 mg total) by mouth daily with breakfast.  . fluticasone (FLONASE) 50 MCG/ACT nasal spray Place 2 sprays into both nostrils daily as needed for allergies or rhinitis.  . hydrALAZINE (APRESOLINE) 25 MG tablet Take 1 tablet (25 mg total) by mouth 3 (three) times daily. For blood pressure  . insulin aspart (NOVOLOG FLEXPEN) 100 UNIT/ML FlexPen Inject 15 Units into the skin 3 (three) times daily with meals. (Patient taking differently: Inject 30 Units into the skin 3 (three) times daily with meals. )  . isosorbide mononitrate (IMDUR) 60 MG 24 hr tablet Take 1 tablet (60 mg total) by mouth daily.  [name] LANTUS SOLOSTAR 100 UNIT/ML Solostar Pen Inject 45 Units into the skin at bedtime.  [name]  loperamide (IMODIUM) 2 MG capsule Take 2 mg by mouth 4 (four) times daily as needed for diarrhea or loose stools.  . magnesium oxide (MAG-OX) 400 (241.3 Mg) MG tablet Take 1 tablet (400 mg total) by mouth 2 (two) times daily.  . melatonin 3 MG TABS tablet TAKE 2 TABLETS BY MOUTH AT BEDTIME.  . metoprolol succinate (TOPROL-XL) 25 MG 24 hr tablet TAKE ONE TABLET BY MOUTH ONCE DAILY  . nitroGLYCERIN (NITROSTAT) 0.4 MG SL tablet Place 1 tablet (0.4 mg total) under the tongue every 5 (five) minutes as needed for chest pain.  [name] ondansetron (ZOFRAN-ODT) 4 MG disintegrating tablet Take 1 tablet (4 mg total) by mouth every 8 (eight) hours as needed for nausea or vomiting.  . pantoprazole (PROTONIX) 40 MG tablet TAKE ONE TABLET BY MOUTH TWICE DAILY  . torsemide (DEMADEX) 20 MG tablet Take 2 tablets (40 mg total) by mouth daily. Hold if having significant vomiting   No facility-administered encounter medications on file as of 09/19/2019.   Review of patient status, including review of  consultants reports, relevant laboratory and other test results, and collaboration with appropriate care team members and the patient's provider was performed as part of comprehensive patient evaluation and provision of care management services.    [name], [place] / [place]   [phone] 4:42 PM

## 2019-09-20 ENCOUNTER — Ambulatory Visit (INDEPENDENT_AMBULATORY_CARE_PROVIDER_SITE_OTHER): Payer: Medicare Other | Admitting: Family Medicine

## 2019-09-20 ENCOUNTER — Encounter: Payer: Self-pay | Admitting: Family Medicine

## 2019-09-20 ENCOUNTER — Other Ambulatory Visit: Payer: Self-pay

## 2019-09-20 VITALS — BP 126/68 | HR 98 | Wt 329.0 lb

## 2019-09-20 DIAGNOSIS — IMO0002 Reserved for concepts with insufficient information to code with codable children: Secondary | ICD-10-CM

## 2019-09-20 DIAGNOSIS — G8929 Other chronic pain: Secondary | ICD-10-CM | POA: Diagnosis present

## 2019-09-20 DIAGNOSIS — F419 Anxiety disorder, unspecified: Secondary | ICD-10-CM | POA: Diagnosis not present

## 2019-09-20 DIAGNOSIS — N179 Acute kidney failure, unspecified: Secondary | ICD-10-CM

## 2019-09-20 DIAGNOSIS — E1165 Type 2 diabetes mellitus with hyperglycemia: Secondary | ICD-10-CM

## 2019-09-20 DIAGNOSIS — Z794 Long term (current) use of insulin: Secondary | ICD-10-CM | POA: Diagnosis not present

## 2019-09-20 DIAGNOSIS — I10 Essential (primary) hypertension: Secondary | ICD-10-CM

## 2019-09-20 DIAGNOSIS — E1140 Type 2 diabetes mellitus with diabetic neuropathy, unspecified: Secondary | ICD-10-CM | POA: Diagnosis not present

## 2019-09-20 DIAGNOSIS — C7A010 Malignant carcinoid tumor of the duodenum: Secondary | ICD-10-CM | POA: Diagnosis not present

## 2019-09-20 LAB — GASTRIN: Gastrin: 102 pg/mL (ref 0–115)

## 2019-09-20 MED ORDER — HYDROMORPHONE HCL 2 MG PO TABS: 1.0000 mg | ORAL_TABLET | Freq: Every day | ORAL | 0 refills | Status: DC | PRN

## 2019-09-20 NOTE — Assessment & Plan Note (Signed)
Pain mostly seems to be from her gastroparesis which is likely related to uncontrolled diabetes.  Discussed with Dr. Andria Frames, who also video precepted during this visit.  Discussed with patient if she would prefer Xanax or Dilaudid, as only one could be written for today.  She requested Dilaudid for her chronic pain and again stated that she would go to the hospital if she did not have it because her pain was so severe.  Agreed to give patient 20 days of 1 mg hydrocodone to get her to her appointment at pain management.  Advised her that this would be the last controlled prescription that [facility] would provide her.  She is not to receive any further controlled substance prescriptions from [facility].  Patient understands and agrees to this plan.

## 2019-09-20 NOTE — Progress Notes (Signed)
SUBJECTIVE:   CHIEF COMPLAINT / HPI:   Malignant carcinoid tumor of duodenum and Chronic Pain from gastroparesis Was seen by oncology 7/6 Patient has been scheduled for a PET scan and been referred to Dr. Barry Dienes for surgical consultation Planning to follow-up 3 weeks after that with oncology Chronic care management has been assisting patient Friday her whole body started hurting She is now down to 1 pill She states her pain is from her gastroparesis When the pain is bad, she vomits a lot and has to go the hospital Dialudid is the only thing that helps She is asking for an Rx for a recliner Rx and walker Rx because she stays in her wheelchair all day She wants to get up and go outside with an attendant now She states her legs give out and she is afraid she might fall She needs to elevate her feet to help with swelling in her legs States that she feels like she needs to go to the hospital for her pain She can't be seen at pain clinic until 8/4 Every day she is taking one dilaudid and states that she needs to continue this in order to stay out of the hospital She has received Dilaudid while in the hospital and most recently on 6/28 was given a 5-day supply per Dr. Garlan Fillers because she was stating that she would return to the ED this afternoon she did not receive pain medication   Anxiety Patient's anxiety has been poorly controlled and was worsening due to diagnosis recently of carcinoid tumor She was referred to psychiatry on 6/28, was called about this or therapy but she said she can't do this right now because of everything else going on with her She was also given a prescription for Xanax at last visit, uses sparingly and not every day Patient did mark 1 on question 9 on PHQ-9, but denies current SI States after she was diagnosed with cancer, she felt that she had lost a little live, but now states that she is feeling okay and wants to continue to fight and treat this    Office  Visit from 12/13/2018 in [place]  PHQ-9 Total Score 24      AKI on CKD, hypokalemia, hypomagnesemia Patient with elevation in creatinine from 1.51 on 6/25-1.81 on 6/28 Was seen by kidney doctor yesterday Patient has had previous BMPs and magnesium with low levels of K and mag  Hypertension Currently on metoprolol, hydralazine, torsemide 40mg  BID Dr. Candiss Norse discontinued norvasc and hydralazine yesterday Was given a new medication but hasn't picked it up yet and doesn't know the name  T2DM Patient is requesting referral to endocrinology as she states she is to follow-up with them and wants a referral today  PERTINENT  PMH / PSH: Chronic pain, gastroparesis, T2DM, anxiety, hyperlipidemia, hypertension, duodenal carcinoid tumor  OBJECTIVE:   BP 126/68   Pulse 98   Wt (!) 329 lb (149.2 kg)   LMP 10/10/2012   SpO2 100%   BMI 53.10 kg/m    Physical Exam:  General: 54 y.o. female in NAD, obese, in wheelchair Lungs: Breathing comfortably on room air Skin: warm and dry Psych: Depressed and anxious mood, tangential thought process, redirectable, denies SI   ASSESSMENT/PLAN:   Chronic pain Pain mostly seems to be from her gastroparesis which is likely related to uncontrolled diabetes.  Discussed with Dr. Andria Frames, who also video precepted during this visit.  Discussed with patient if she would prefer Xanax  or Dilaudid, as only one could be written for today.  She requested Dilaudid for her chronic pain and again stated that she would go to the hospital if she did not have it because her pain was so severe.  Agreed to give patient 20 days of 1 mg hydrocodone to get her to her appointment at pain management.  Advised her that this would be the last controlled prescription that [facility] would provide her.  She is not to receive any further controlled substance prescriptions from [facility].  Patient understands and agrees to this plan.  Malignant carcinoid tumor of duodenum  [facility]) She is currently following with oncology.  Has follow-up for PET scan and surgery.  Given patient's weakness, provided written prescriptions for recliner and walker for her to take to medical supply store.  This is a big source of her anxiety and given that she is dealing with many acute and chronic problems at this time, encouraged patient to consider therapy.  Anxiety Advised patient that only Xanax or Dilaudid would be prescribed at this visit.  She opted for Dilaudid at this time.  Xanax should not be prescribed for her given concern for overdose and sedating effects especially when use with opioids.  Patient advised that she will no longer be receiving controlled substance prescriptions from [facility] after this Dilaudid prescription and that she will follow-up with pain management on 8/4.  Encourage therapy, given resources.  Ensured the patient is not actively suicidal.  Given suicide hotline number.  Hypomagnesemia Previous hypomagnesemia on last check.  Patient has not been taking magnesium to her knowledge.  We will follow-up magnesium levels today and see if they need to be repleted.  Essential hypertension, benign It seems that some hypertension medications have been changed.  Patient does not know the name.  BP today is well controlled.  Will obtain BMP today.  Have patient bring meds with her to a future visit.  Uncontrolled type 2 diabetes mellitus with diabetic neuropathy, with long-term current use of insulin ([place]) This has been poorly controlled in the past.  Patient is requesting a referral to endocrinology.  Referral placed at patient request.  AKI (acute kidney injury) [facility]) Patient with AKI on CKD with last check by Dr. Garlan Fillers on 6/28.  We will recheck patient's BMP today to assess kidney function.  Seems that she is currently taking torsemide 40 mg twice daily   Patient advised to come back in 2 to 4 weeks to further assess her chronic medical problems.  Cleophas Dunker, [place]

## 2019-09-20 NOTE — Patient Instructions (Signed)
Thank you for coming to see me today. It was a pleasure. Today we talked about:   We will get some labs today.  If they are abnormal or we need to do something about them, I will call you.  If they are normal, I will send you a message on MyChart (if it is active) or a letter in the mail.  If you don't hear from [place] in 2 weeks, please call the office at the number below.  I have placed a referral to Endocrinology for your diabetes.  If you do not hear from them in the next 2 weeks, please give [place] a call.   Please follow-up with me in 2-4 weeks.  If you have any questions or concerns, please do not hesitate to call the office at [phone].  Best,   [place] Constable, DO   Therapy and Counseling Resources Most providers on this list will take Medicaid. Patients with commercial insurance or Medicare should contact their insurance company to get a list of in network providers.  [facility]  [address], [place], [place] [address]      [facility] [address]  [place], [place] [address] [phone]  [place] [address], [place]  [place], [place] [address]       [phone]      Jinny Blossom [facility] [address] [place], [place]  [facility]:  [place]. [place]  [facility]:  [place] [address] Loni Muse, [place]  [facility] (under & uninsured) [address], [place] [phone]    [email or web address]    [facility] [place]     Phone:  [phone]     Furman Bracey  [place] [address]      [place], [place] ext [place]: [place], [place], [place]   [place] ([place] therapist) [address] [place] [address]   [place] [address]    [phone]    [facility]   [place],  [place], [place]   [place]  [place]  [facility]  [address] [place], [place]        [phone]  Open Access/Walk In Clinic under & uninsured  [place], To schedule an appointment call [phone] [address] [place] [phone]):  Fay Records from 8 AM - 3 PM Moving June 1 to [employer] at [facility] [address] [place],  ([place])   [place], [place]: [phone]) 8:30 - 12; 1 - 2:30  [facility] [place],  [place], [place]    [phone]):8:30 - 12; 2 - 3PM  [facility] [place],  [address],  [place]; [phone]):   Mon - Fri 8 AM - 5 PM  [facility] [place]  MWF 12:30 to 3:00 or call to schedule an appointment  [phone]  Specific Provider options Psychology Today  [email or web address]/us 1. click on find a therapist  2. enter your zip code 3. left side and  select or tailor a therapist for your specific need.   [facility] Provider Directory [email or web address]  (Medicaid)   Follow all drop down to find a provider  [place] or [email or web address] [address], [place], [place] Recovery support and educational   24- Hour Availability:     [facility]   [address] [place], [place] Crisis [phone]   [facility] [employer] [phone]  [place]  [phone]    [place]  [phone] (after hours)   [facility]rapeutic Alternative/Mobile Crisis   [phone]   [place] National Suicide  Hotline  [phone] Diamantina Monks)   Call 911 or go to emergency room   [facility]  [phone]);  [place] and [employer]  [phone]); [place], [place], [place], [place], [place], [place], [place]

## 2019-09-20 NOTE — Assessment & Plan Note (Signed)
Previous hypomagnesemia on last check.  Patient has not been taking magnesium to her knowledge.  We will follow-up magnesium levels today and see if they need to be repleted.

## 2019-09-20 NOTE — Assessment & Plan Note (Signed)
She is currently following with oncology.  Has follow-up for PET scan and surgery.  Given patient's weakness, provided written prescriptions for recliner and walker for her to take to medical supply store.  This is a big source of her anxiety and given that she is dealing with many acute and chronic problems at this time, encouraged patient to consider therapy.

## 2019-09-20 NOTE — Assessment & Plan Note (Signed)
Advised patient that only Xanax or Dilaudid would be prescribed at this visit.  She opted for Dilaudid at this time.  Xanax should not be prescribed for her given concern for overdose and sedating effects especially when use with opioids.  Patient advised that she will no longer be receiving controlled substance prescriptions from [facility] after this Dilaudid prescription and that she will follow-up with pain management on 8/4.  Encourage therapy, given resources.  Ensured the patient is not actively suicidal.  Given suicide hotline number.

## 2019-09-20 NOTE — Assessment & Plan Note (Signed)
Patient with AKI on CKD with last check by Dr. Garlan Fillers on 6/28.  We will recheck patient's BMP today to assess kidney function.  Seems that she is currently taking torsemide 40 mg twice daily

## 2019-09-20 NOTE — Assessment & Plan Note (Signed)
This has been poorly controlled in the past.  Patient is requesting a referral to endocrinology.  Referral placed at patient request.

## 2019-09-20 NOTE — Assessment & Plan Note (Signed)
It seems that some hypertension medications have been changed.  Patient does not know the name.  BP today is well controlled.  Will obtain BMP today.  Have patient bring meds with her to a future visit.

## 2019-09-21 ENCOUNTER — Ambulatory Visit: Payer: Medicare Other | Admitting: Licensed Clinical Social Worker

## 2019-09-21 ENCOUNTER — Telehealth: Payer: Self-pay

## 2019-09-21 DIAGNOSIS — Z7189 Other specified counseling: Secondary | ICD-10-CM

## 2019-09-21 LAB — BASIC METABOLIC PANEL
BUN/Creatinine Ratio: 15 (ref 9–23)
BUN: 18 mg/dL (ref 6–24)
CO2: 23 mmol/L (ref 20–29)
Calcium: 9.2 mg/dL (ref 8.7–10.2)
Chloride: 100 mmol/L (ref 96–106)
Creatinine, Ser: 1.24 mg/dL — ABNORMAL HIGH (ref 0.57–1.00)
GFR calc Af Amer: 57 mL/min/{1.73_m2} — ABNORMAL LOW (ref >59)
GFR calc non Af Amer: 49 mL/min/{1.73_m2} — ABNORMAL LOW (ref >59)
Glucose: 273 mg/dL — ABNORMAL HIGH (ref 65–99)
Potassium: 3.8 mmol/L (ref 3.5–5.2)
Sodium: 137 mmol/L (ref 134–144)

## 2019-09-21 LAB — MAGNESIUM: Magnesium: 1.2 mg/dL — ABNORMAL LOW (ref 1.6–2.3)

## 2019-09-21 LAB — CHROMOGRANIN A: Chromogranin A (ng/mL): 688.6 ng/mL — ABNORMAL HIGH (ref 0.0–101.8)

## 2019-09-21 NOTE — Chronic Care Management (AMB) (Signed)
    Clinical Social Work  Care Management Outreach   09/21/2019 Name: Deborah [name] MRN: [id number] DOB: 04/07/1965  Deborah [name] is a 54 y.o. year old female who is a primary care patient of Meccariello, Bernita Raisin, DO .   LCSW returned call to [employer] today.  Patient experiencing barriers with obtaining her DME. Intervention: Collaborated with CCM RN ref. Patient's needs and options Plan: CCM RN will F/U with patient in 3 to 5 days after consultation and collaboration with DME providers  Review of patient status, including review of consultants reports, relevant laboratory and other test results, and collaboration with appropriate care team members and the patient's provider was performed as part of comprehensive patient evaluation and provision of care management services.   Goals Addressed            This Visit's Progress   . I need a walker and recliner   Not on track    [place] (see longitudinal plan of care for additional care plan information)  Current Barriers:  . Patient with chronic medical conditions states she needs a walker and a recliner so that she can raise her feet to prevent swelling,  . Acknowledges deficits and needs support, education and care coordination in order to meet this unmet need  . Patient received hard Rx from PCP for rolling walker and  recliner, she is having difficulty finding someone that will provide it for her Clinical Goal(s):  Over the next 30 days, patient will receive DME and be able to safely meet needs.  Interventions: . Assessed patient needs, and how currently meeting needs . Collaborated with PCP regarding patient needs and request  . Collaborated with CCM RN care manager regarding options that would meet patient needs . CCM RN care manager will explore various options and provide patient with an update Patient Self Care Activities: . Patient is unable to independently navigate getting DME without care  coordination support  Please see past updates related to this goal by clicking on the "Past Updates" button in the selected goal       [name], [place] / [place]   [phone] 1:30 PM

## 2019-09-21 NOTE — Telephone Encounter (Signed)
Received message from pt. requesting a call back. Pt. wanted to know what type of cancer she has. Advised pt. per Dr. Grier Mitts office visit note with her on 09/11/2019 that she has a Newly Diagnosed Duodenal Carcinoid. Pt. verbalized "thank you, that's all I needed to know".

## 2019-09-22 ENCOUNTER — Other Ambulatory Visit: Payer: Self-pay | Admitting: Family Medicine

## 2019-09-22 NOTE — Progress Notes (Signed)
error 

## 2019-09-24 ENCOUNTER — Other Ambulatory Visit: Payer: Self-pay

## 2019-09-24 ENCOUNTER — Ambulatory Visit (HOSPITAL_COMMUNITY)
Admission: RE | Admit: 2019-09-24 | Discharge: 2019-09-24 | Disposition: A | Discharge status: Home / Self Care | Payer: Medicare Other | Source: Ambulatory Visit | Attending: Hematology | Admitting: Hematology

## 2019-09-24 DIAGNOSIS — C7A010 Malignant carcinoid tumor of the duodenum: Secondary | ICD-10-CM | POA: Insufficient documentation

## 2019-09-24 IMAGING — CR DG ABDOMEN ACUTE W/ 1V CHEST
3 series · 3 of 3 positions shown · non-contrast
Comparison: CT 07/30/2017.  Radiographs 07/29/2017.

CLINICAL DATA: Generalized abdominal pain with nausea and vomiting
since yesterday. Nonradiating mid chest pain and shortness of breath
today.

EXAM:
DG ABDOMEN ACUTE W/ 1V CHEST

[chest pa]
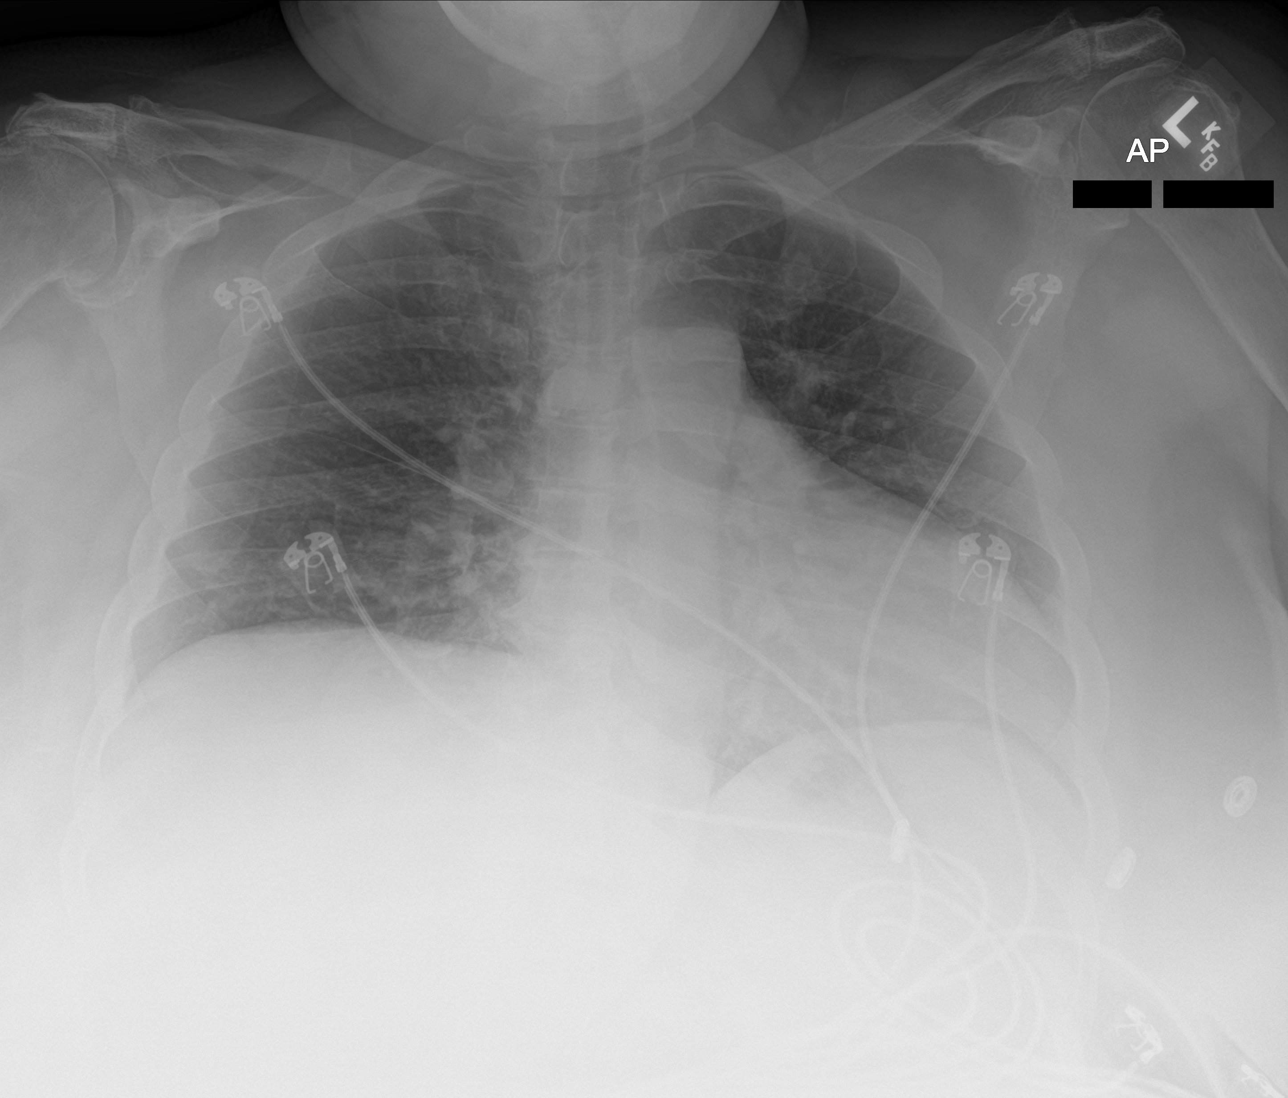

[abdomen supine]
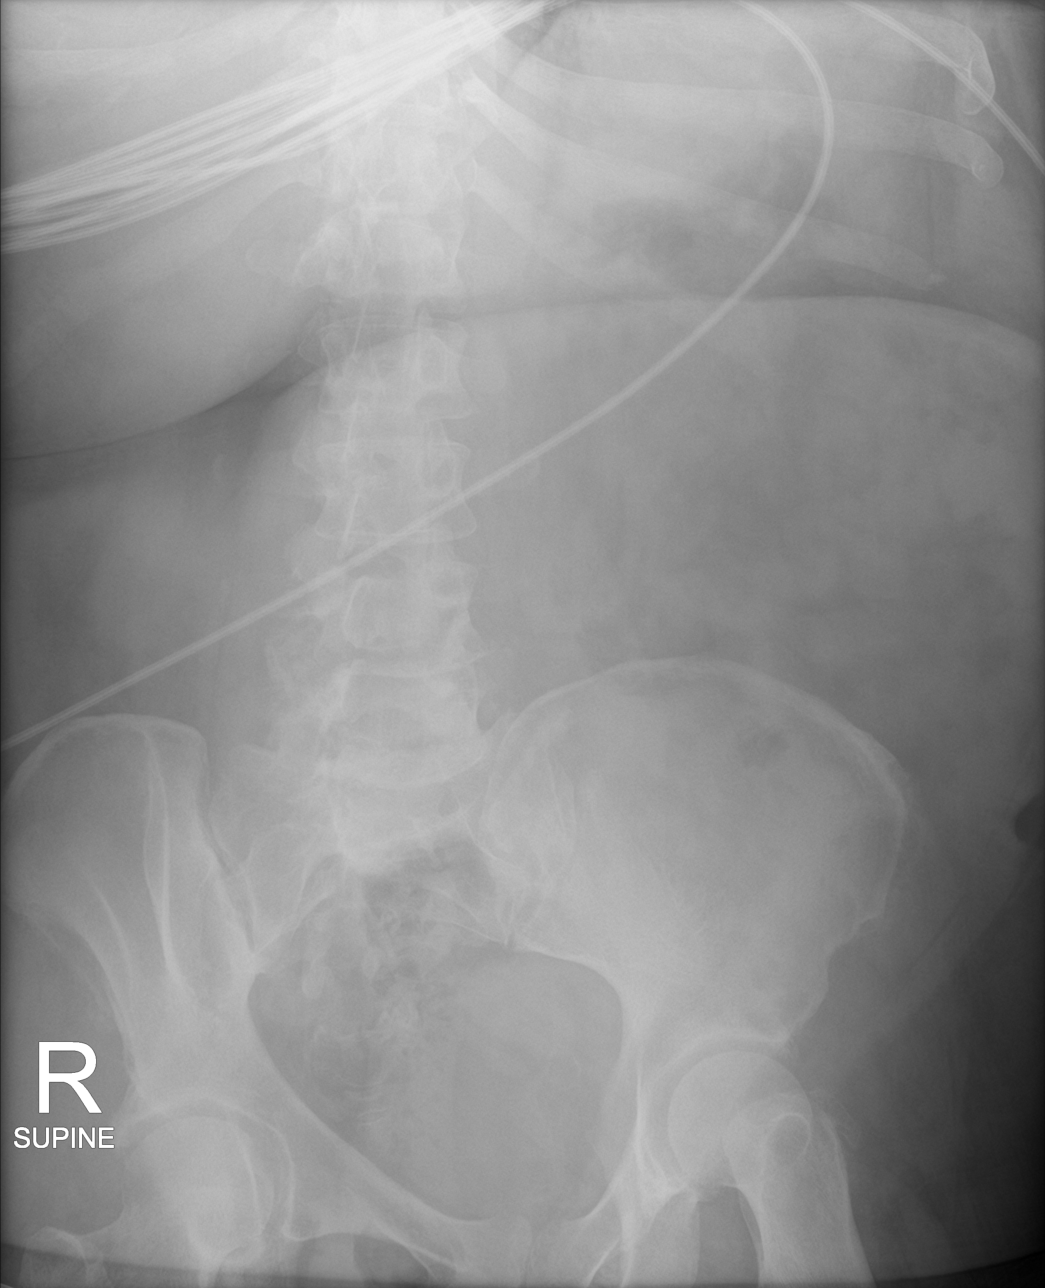

[abdomen decu]
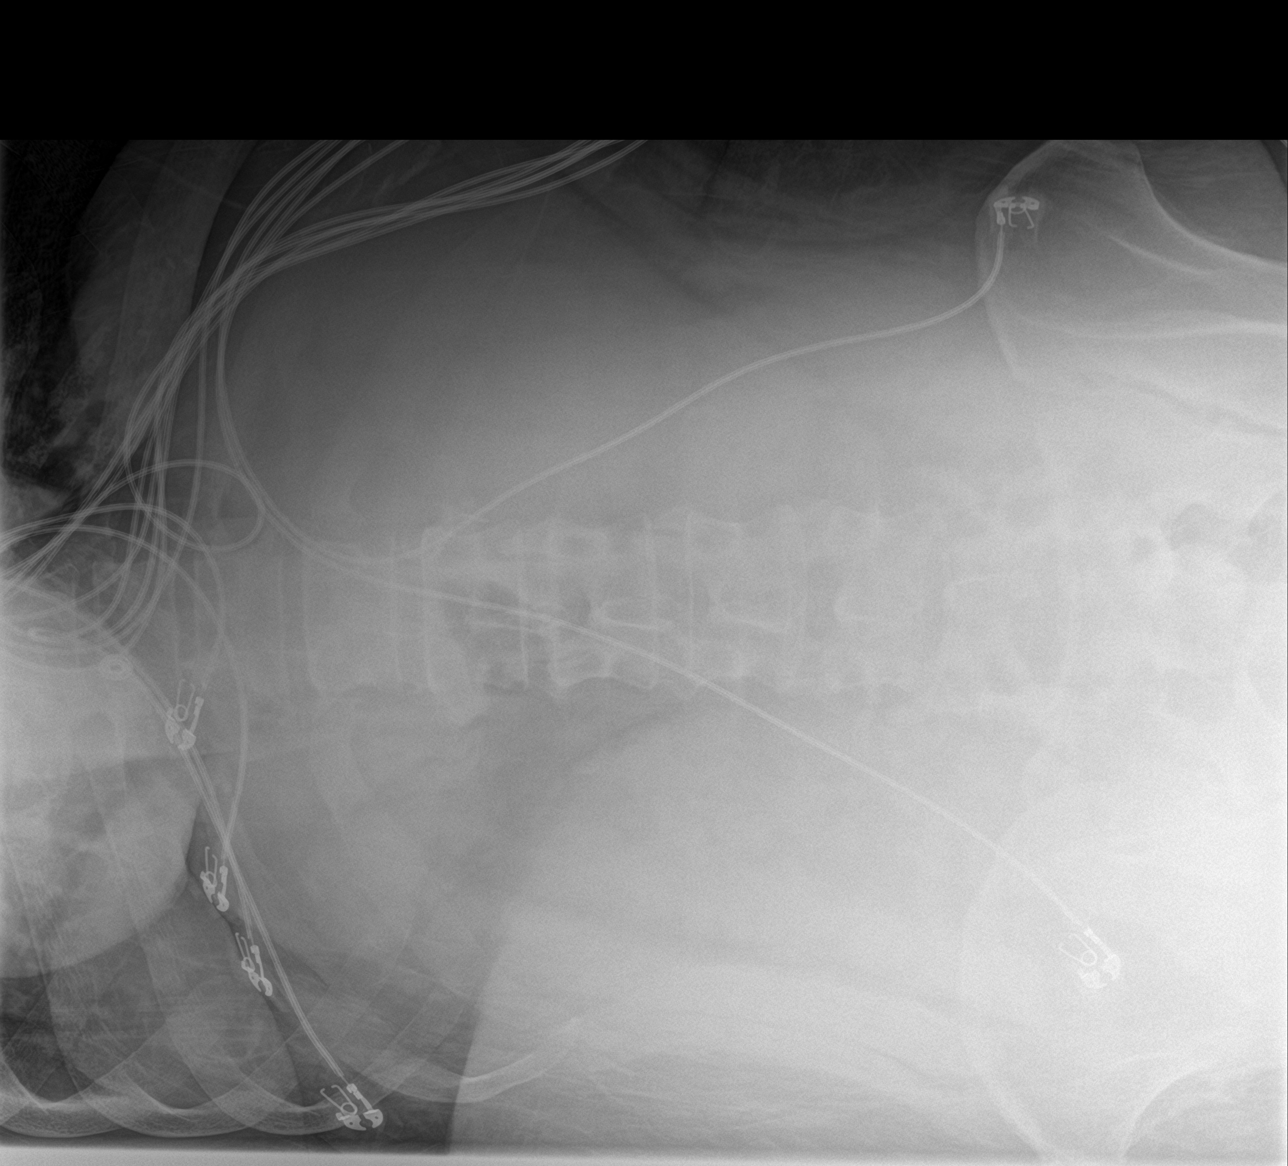

[3 of 3 positions shown; findings below may reference images not displayed]

FINDINGS: Frontal chest radiograph demonstrates lower lung volumes with
resulting mild bibasilar atelectasis. The heart size and mediastinal
contours are stable. There is no pleural effusion or pneumothorax.
There are chromic clavicular degenerative changes with narrowing of
the subacromial space the right shoulder, consistent with a rotator
cuff tear.

The abdomen is nearly gasless on the abdominal radiographs. There is
no evidence of free intraperitoneal air or bowel wall thickening. No
suspicious abdominal calcifications. Lower lumbar spondylosis noted.
IMPRESSION: 1. The abdomen is nearly gasless without obvious distended bowel or
free air.
2. Low lung volumes with mild bibasilar atelectasis.

## 2019-09-24 MED ORDER — GALLIUM GA 68 DOTATATE IV KIT
5.7000 | PACK | Freq: Once | INTRAVENOUS | Status: AC
  Administered 2019-09-24: 5.7 via INTRAVENOUS

## 2019-09-26 ENCOUNTER — Other Ambulatory Visit: Payer: Self-pay | Admitting: Gastroenterology

## 2019-09-26 IMAGING — CR DG ABDOMEN ACUTE W/ 1V CHEST
5 series · 5 of 5 positions shown · non-contrast
Comparison: Chest radiograph March 19, 2017

CLINICAL DATA: LEFT chest pain radiating to LEFT arm today.
Abdominal pain for few days. History of diabetes and
cholecystectomy.

EXAM:
DG ABDOMEN ACUTE W/ 1V CHEST

[w abdomen decub (1 of 2)]
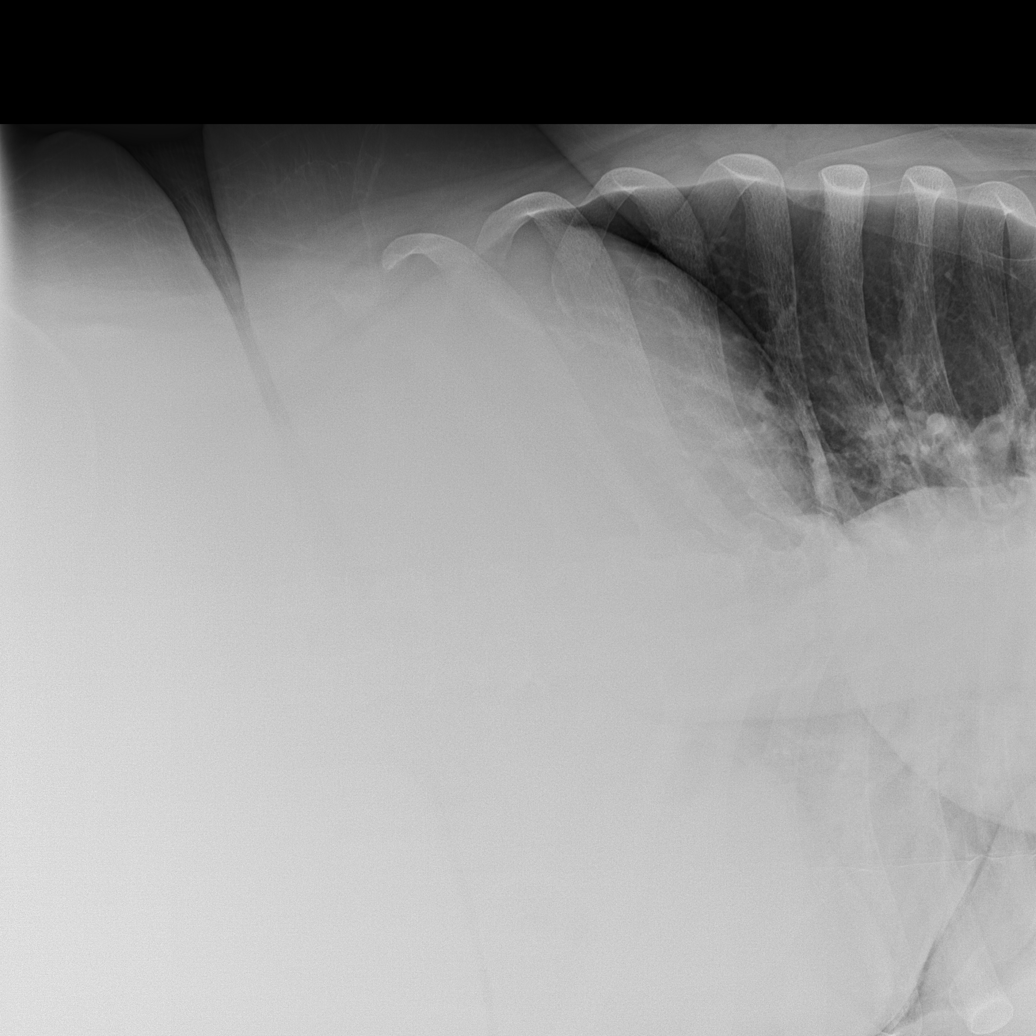

[w abdomen decub (2 of 2)]
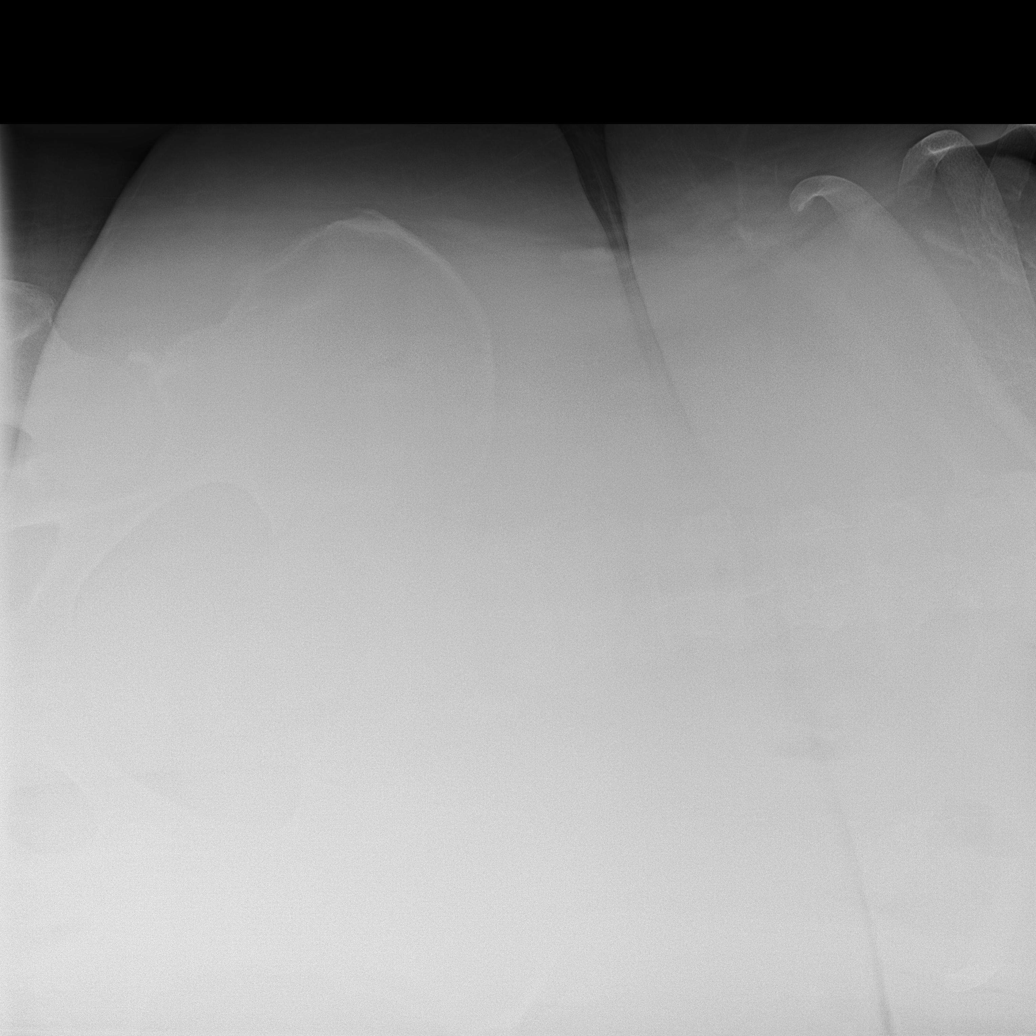

[t abdomen supine (1 of 2)]
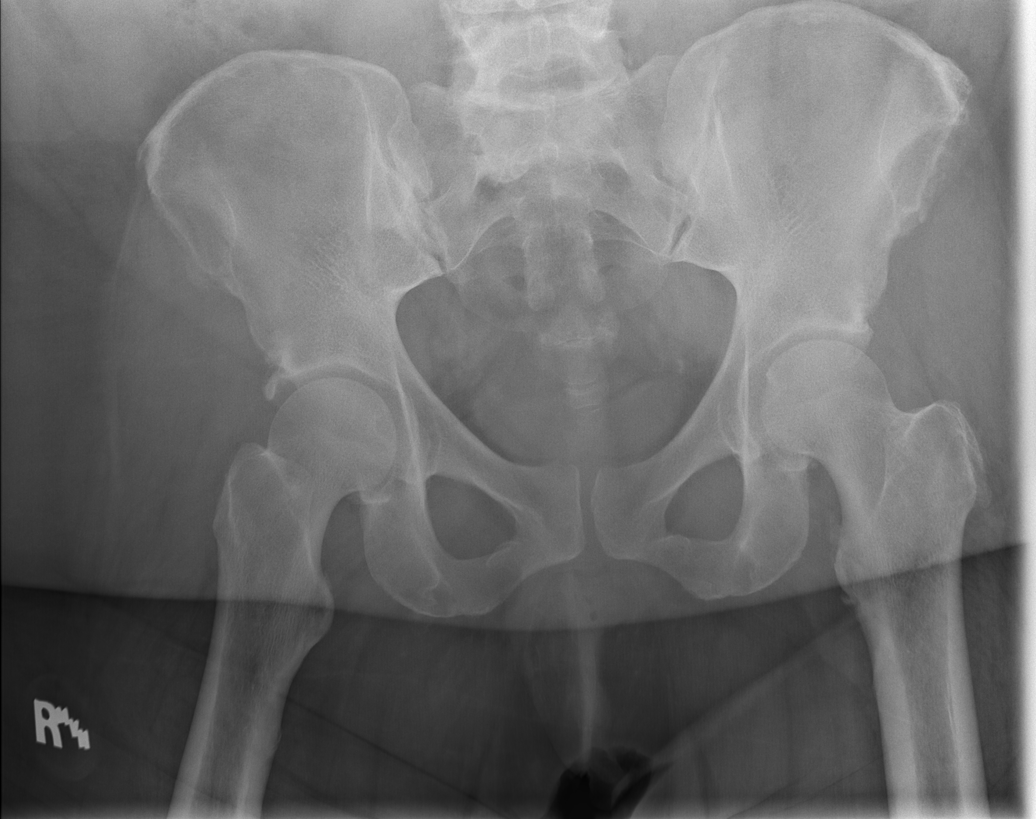

[t abdomen supine (2 of 2)]
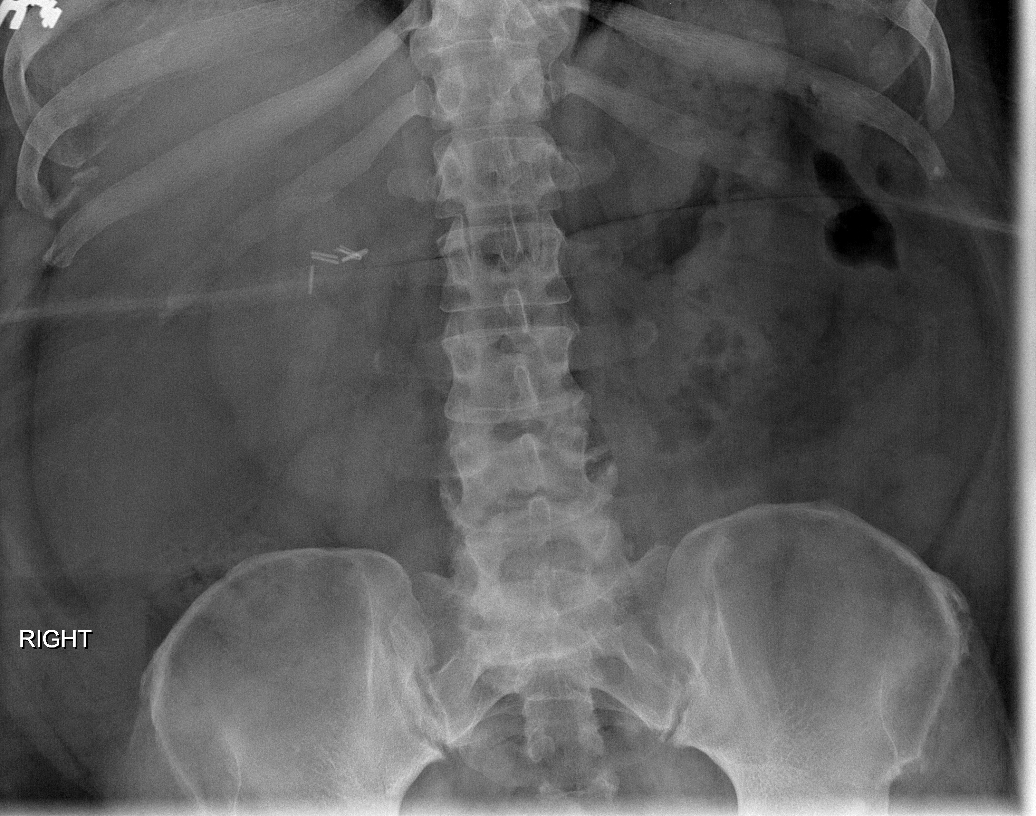

[x chest ap]
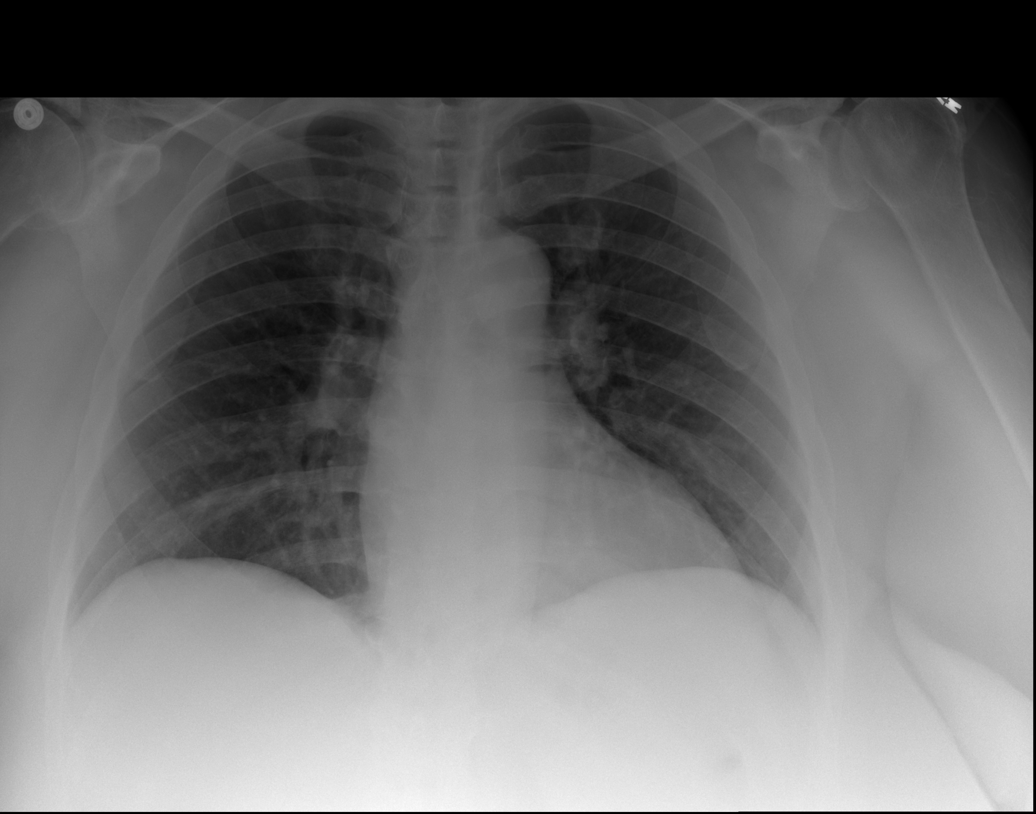

[5 of 5 positions shown; findings below may reference images not displayed]

FINDINGS: Habitus limited examination.

Cardiomediastinal silhouette is normal. Lungs are clear, no pleural
effusions. No pneumothorax. Soft tissue planes and included osseous
structures are unremarkable.

Paucity of bowel gas. Surgical clips in the included right abdomen
compatible with cholecystectomy. No intra-abdominal mass effect,
pathologic calcifications or free air. Soft tissue planes and
included osseous structures are non-suspicious.
IMPRESSION: Negative chest radiograph.

Non-specific bowel gas pattern.

## 2019-09-27 ENCOUNTER — Ambulatory Visit: Payer: Self-pay

## 2019-09-27 ENCOUNTER — Other Ambulatory Visit: Payer: Self-pay | Admitting: Family Medicine

## 2019-09-27 DIAGNOSIS — G8929 Other chronic pain: Secondary | ICD-10-CM

## 2019-09-27 DIAGNOSIS — I5022 Chronic systolic (congestive) heart failure: Secondary | ICD-10-CM

## 2019-09-27 NOTE — Progress Notes (Signed)
DME orders placed for rollator and lift chair

## 2019-09-27 NOTE — Chronic Care Management (AMB) (Signed)
  Care Management   Follow Up Note   09/27/2019 Name: ZYLEE MARCHIANO MRN: [id number] DOB: 1966-02-08  Referred by: Cleophas Dunker, DO Reason for referral : Chronic Care Management (DME)   RAZIYAH VANVLECK is a 54 y.o. year old female who is a primary care patient of Cleophas Dunker, DO. The care management team was consulted for assistance with care management and care coordination needs.    Review of patient status, including review of consultants reports, relevant laboratory and other test results, and collaboration with appropriate care team members and the patient's provider was performed as part of comprehensive patient evaluation and provision of chronic care management services.    SDOH (Social Determinants of Health) assessments performed: No See Care Plan activities for detailed interventions related to [facility])     Advanced Directives: See Care Plan and Vynca application for related entries.   Goals Addressed              This Visit's Progress   .  I need a walker and recliner        CARE PLAN ENTRY (see longitudinal plan of care for additional care plan information)  Current Barriers:  . Patient with chronic medical conditions states she needs a walker and a recliner so that she can raise her feet to prevent swelling,  . Acknowledges deficits and needs support, education and care coordination in order to meet this unmet need  . Patient received hard Rx from PCP for rolling walker and  recliner, she is having difficulty finding someone that will provide it for her Clinical Goal(s):  Over the next 30 days, patient will receive DME and be able to safely meet needs.  Interventions: . Assessed patient needs, and how currently meeting needs . Collaborated with PCP regarding patient needs and request  . Collaborated with CCM RN care manager regarding options that would meet patient needs . CCM RN care manager will explore various options and provide  patient with an update . 09/26/19 . RNCM spoke with the patient a advised her that I will collaborate with her PCP to have an order written for the rollator and lift chair . I also notified her that I spoke with Darlina Guys at [employer] about the lift chair and  her insurance will only cover the motor for the chair and she will be responsible for rest of the money.  She stated that she understood.  I advised her to call the store and talk with them about the chairs and styles they have and maybe go and look at them to see what she would like before getting one.  I gave her the number to call.  She stated that she would. [name] RNCM asked the patient's PCP to write orders.  Patient Self Care Activities: . Patient is unable to independently navigate getting DME without care coordination support  Please see past updates related to this goal by clicking on the "Past Updates" button in the selected goal       .  Patient Stated (pt-stated)          The care management team will reach out to the patient again over the next 5 days.   Lazaro Arms RN, BSN, [facility] Care Management Coordinator [place] Phone: [phone] Fax: [phone]

## 2019-09-27 NOTE — Patient Instructions (Signed)
Visit Information  Goals Addressed              This Visit's Progress   .  I need a walker and recliner        CARE PLAN ENTRY (see longitudinal plan of care for additional care plan information)  Current Barriers:  . Patient with chronic medical conditions states she needs a walker and a recliner so that she can raise her feet to prevent swelling,  . Acknowledges deficits and needs support, education and care coordination in order to meet this unmet need  . Patient received hard Rx from PCP for rolling walker and  recliner, she is having difficulty finding someone that will provide it for her Clinical Goal(s):  Over the next 30 days, patient will receive DME and be able to safely meet needs.  Interventions: . Assessed patient needs, and how currently meeting needs . Collaborated with PCP regarding patient needs and request  . Collaborated with CCM RN care manager regarding options that would meet patient needs . CCM RN care manager will explore various options and provide patient with an update . 09/26/19 . RNCM spoke with the patient a advised her that I will collaborate with her PCP to have an order written for the rollator and lift chair . I also notified her that I spoke with Darlina Guys at [employer] about the lift chair and  her insurance will only cover the motor for the chair and she will be responsible for rest of the money.  She stated that she understood.  I advised her to call the store and talk with them about the chairs and styles they have and maybe go and look at them to see what she would like before getting one.  I gave her the number to call.  She stated that she would. [name] RNCM asked the patient's PCP to write orders.  Patient Self Care Activities: . Patient is unable to independently navigate getting DME without care coordination support  Please see past updates related to this goal by clicking on the "Past Updates" button in the selected goal       .  Patient  Stated (pt-stated)         Deborah [name] was given information about Care Management services today including:  1. Care Management services include personalized support from designated clinical staff supervised by her physician, including individualized plan of care and coordination with other care providers 2. 24/7 contact phone numbers for assistance for urgent and routine care needs. 3. The patient may stop CCM services at any time (effective at the end of the month) by phone call to the office staff.  Patient agreed to services and verbal consent obtained.   The patient verbalized understanding of instructions provided today and declined a print copy of patient instruction materials.   The care management team will reach out to the patient again over the next 5 days.   Lazaro Arms RN, BSN, [facility] Care Management Coordinator [place] Phone: [phone] Fax: [phone]

## 2019-09-28 ENCOUNTER — Other Ambulatory Visit: Payer: Self-pay

## 2019-09-28 ENCOUNTER — Ambulatory Visit: Payer: Medicare Other

## 2019-09-28 NOTE — Chronic Care Management (AMB) (Signed)
Care Management   Follow Up Note   09/28/2019 Name: TANAYIA WAHLQUIST MRN: [id number] DOB: [birth year]  Referred by: Cleophas Dunker, DO Reason for referral : Chronic Care Management (DM II /DME)   ZEBA LUBY is a 54 y.o. year old female who is a primary care patient of Cleophas Dunker, DO. The care management team was consulted for assistance with care management and care coordination needs.    Review of patient status, including review of consultants reports, relevant laboratory and other test results, and collaboration with appropriate care team members and the patient's provider was performed as part of comprehensive patient evaluation and provision of chronic care management services.    SDOH (Social Determinants of Health) assessments performed: No See Care Plan activities for detailed interventions related to [facility])     Advanced Directives: See Care Plan and Vynca application for related entries.   Goals Addressed              This Visit's Progress   .  I been in the hospital 2-3 times for my Blood sugar (pt-stated)        CARE PLAN ENTRY (see longtitudinal plan of care for additional care plan information)  Objective:  Lab Results  Component Value Date   HGBA1C 8.7 (H) 07/22/2019 .   Lab Results  Component Value Date   CREATININE 1.81 (H) 09/03/2019   CREATININE 1.51 (H) 08/31/2019   CREATININE 1.49 (H) 08/30/2019 .   [name] No results found for: EGFR  Current Barriers:  [name] Knowledge Deficits related to basic Diabetes pathophysiology and self care/management   Case Manager Clinical Goal(s):  Over the next 30 days, patient will demonstrate improved adherence to prescribed treatment plan for diabetes self care/management as evidenced by: remaining out of the hospital   Interventions:  . Provided education to patient about basic DM disease process . Reviewed medications with patient and discussed importance of medication  adherence . Discussed plans with patient for ongoing care management follow up and provided patient with direct contact information for care management team . Provided patient with written educational materials related to hypo and hyperglycemia and importance of correct treatment . Advised patient, providing education and rationale, to check cbg and record, calling the office for findings outside established parameters.   . The patient states that she never has lows.  She only had a low 1 time and then she was clammy and disoriented. . When her blood sugar is high, she will start to vomit.  The highest she stated was in the 400's. . I would like to send the patient some educational material on diabetes Living well with Diabetes, Cirrhosis, Calendar  09/28/19 . Patient states that she is still dealing with abdominal pain.  She was given a prescription for dilaudid by her PCP butt feels that it may not last to her appointment at the pain clinic. [name] She has an appointment with her oncology doctor to review her pet scan and go over options. . She states that her blood sugar this morning was 325. [name] She is able to eat some but she states that it make her stomach hurt more and she gets nauseated. . Advised the patient just to continue to rest take her medications as prescribed. . I let her know that her PCP wrote the orders for the rollator and lift chair.  [employer] has them and should be contacting her soon.  She did call the retail office and they told her it  would be good for her to come in if possible to review the different type of chairs.  Patient Self Care Activities:  . Self administers oral medications as prescribed . Attends all scheduled provider appointments  Please see past updates related to this goal by clicking on the "Past Updates" button in the selected goal          The care management team will reach out to the patient again over the next 14 days.   Lazaro Arms RN, BSN, [facility] Care  Management Coordinator [place] Phone: [phone] Fax: [phone]

## 2019-09-28 NOTE — Patient Instructions (Signed)
Visit Information  Goals Addressed              This Visit's Progress   .  I been in the hospital 2-3 times for my Blood sugar (pt-stated)        CARE PLAN ENTRY (see longtitudinal plan of care for additional care plan information)  Objective:  Lab Results  Component Value Date   HGBA1C 8.7 (H) 07/22/2019 .   Lab Results  Component Value Date   CREATININE 1.81 (H) 09/03/2019   CREATININE 1.51 (H) 08/31/2019   CREATININE 1.49 (H) 08/30/2019 .   [name] No results found for: EGFR  Current Barriers:  [name] Knowledge Deficits related to basic Diabetes pathophysiology and self care/management   Case Manager Clinical Goal(s):  Over the next 30 days, patient will demonstrate improved adherence to prescribed treatment plan for diabetes self care/management as evidenced by: remaining out of the hospital   Interventions:  . Provided education to patient about basic DM disease process . Reviewed medications with patient and discussed importance of medication adherence . Discussed plans with patient for ongoing care management follow up and provided patient with direct contact information for care management team . Provided patient with written educational materials related to hypo and hyperglycemia and importance of correct treatment . Advised patient, providing education and rationale, to check cbg and record, calling the office for findings outside established parameters.   . The patient states that she never has lows.  She only had a low 1 time and then she was clammy and disoriented. . When her blood sugar is high, she will start to vomit.  The highest she stated was in the 400's. . I would like to send the patient some educational material on diabetes Living well with Diabetes, Cirrhosis, Calendar  09/28/19 . Patient states that she is still dealing with abdominal pain.  She was given a prescription for dilaudid by her PCP butt feels that it may not last to her appointment at the pain  clinic. [name] She has an appointment with her oncology doctor to review her pet scan and go over options. . She states that her blood sugar this morning was 325. [name] She is able to eat some but she states that it make her stomach hurt more and she gets nauseated. . Advised the patient just to continue to rest take her medications as prescribed. . I let her know that her PCP wrote the orders for the rollator and lift chair.  [employer] has them and should be contacting her soon.  She did call the retail office and they told her it would be good for her to come in if possible to review the different type of chairs.  Patient Self Care Activities:  . Self administers oral medications as prescribed . Attends all scheduled provider appointments  Please see past updates related to this goal by clicking on the "Past Updates" button in the selected goal         Ms. Prentiss was given information about Care Management services today including:  1. Care Management services include personalized support from designated clinical staff supervised by her physician, including individualized plan of care and coordination with other care providers 2. 24/7 contact phone numbers for assistance for urgent and routine care needs. 3. The patient may stop CCM services at any time (effective at the end of the month) by phone call to the office staff.  Patient agreed to services and verbal consent obtained.   The patient  verbalized understanding of instructions provided today and declined a print copy of patient instruction materials.   The care management team will reach out to the patient again over the next 14 days.   Lazaro Arms RN, BSN, [facility] Care Management Coordinator [place] Phone: [phone] Fax: [phone]

## 2019-10-01 ENCOUNTER — Other Ambulatory Visit: Payer: Self-pay

## 2019-10-01 ENCOUNTER — Inpatient Hospital Stay (HOSPITAL_BASED_OUTPATIENT_CLINIC_OR_DEPARTMENT_OTHER): Payer: Medicare Other | Admitting: Hematology

## 2019-10-01 VITALS — BP 133/88 | HR 113 | Temp 97.5°F | Resp 18 | Ht 66.0 in | Wt 336.3 lb

## 2019-10-01 DIAGNOSIS — C7A010 Malignant carcinoid tumor of the duodenum: Secondary | ICD-10-CM | POA: Diagnosis not present

## 2019-10-01 NOTE — Progress Notes (Signed)
HEMATOLOGY/ONCOLOGY CONSULTATION NOTE  Date of Service: 10/01/2019  Patient Care Team: Cleophas Dunker, DO as PCP - General (Family Medicine) Sueanne Margarita, MD as PCP - Cardiology (Cardiology) Ernest Haber, MD (Family Medicine) Brunetta Genera, MD as Consulting Physician (Hematology) Jonnie Finner, RN as Oncology Nurse Navigator Laurance Flatten, Bosie Helper, LCSW as Social Worker (Licensed Clinical Social Worker)  CHIEF COMPLAINTS/PURPOSE OF CONSULTATION:  Newly Diagnosed Duodenal Carcinoid  HISTORY OF PRESENTING ILLNESS:  Deborah [name] is a wonderful 54 y.o. female who has been referred to [place] by Dr Nira Conn Krystal Eaton for evaluation and management of newly diagnosed Duodenal Carcinoid. The pt reports that she is doing well overall.   The pt reports that she is having abdominal pain and has not been able to have a bowel movement today, but she has continued eating well. Pt believes that her abdominal pain is due diabetic gastroparesis but claims that she has not spoken explicitly regarding this with her Gastroenterologist . She has had SOB, but feels that it could be caused by her CHF. Pt has previously had chronic diarrhea and notes that her Diabetes has not been well-controlled.   Of note prior to the patient's visit today, pt has had CT Abd ([id number]) completed on 08/03/2019 with results revealing "1. A 15 mm linear radiopaque metallic density in the region of the duodenal bulb, likely an ingested foreign object. No bowel obstruction or active inflammation in the visualized abdomen. No evidence of perforation. 2. Fatty liver. 3. Aortic Atherosclerosis (ICD10-I70.0)."  Pt has had Upper Endoscopy completed on 08/02/2019 with results revealing "- Z-line regular, 36 cm from the incisors. - Normal mucosa was found in the entire stomach. - Non-bleeding duodenal ulcer with no stigmata of bleeding. - A single duodenal polyp. Resected and retrieved. Clip was placed. - Normal  second portion of the duodenum."   Pt has had Colonoscopy completed on 07/30/2019 with results revealing "- Preparation of the colon was fair. - The examined portion of the ileum was normal. - Two 2 to 3 mm polyps in the descending colon. Biopsied. - One 10 mm polyp in the proximal sigmoid colon, removed with a hot snare. Resected and retrieved. - Internal hemorrhoids."  Pt has had Duodenal Polypectomy Surgical Pathology ([id number]) completed on 07/27/2019 with results revealing "DUODENAL BULB, POLYPECTOMY: - Well-differentiated neuroendocrine (carcinoid) tumor."  Pt has had Colonic Polypectomy Surgical Pathology ([id number]) completed on 07/30/2019 with results revealing "A. COLON, DESCENDING, POLYPECTOMY: - Tubular adenoma. - No high-grade dysplasia or carcinoma. B. COLON, SIGMOID, POLYPECTOMY: - Tubular adenoma. - No high-grade dysplasia or carcinoma."  Pt has had Duodenal Polypectomy ([id number]) completed on  08/02/2019 with results revealing "DUODENUM, POLYPECTOMY: - Low grade neuroendocrine tumor (carcinoid tumor), broadly present at multiple tissue edges. - Lymphovascular invasion is identified."  Most recent lab results (08/07/2019) of CBC w/diff and CMP is as follows: all values are WNL except for RBC at 3.06, Hgb at 8.2, HCT at 27.9, MCHC at 29.4, Glucose at 157, BUN at 30, Creatinine at 1.28, Calcium at 8.4, Total Protein at 6.4, Albumin at 2.7, AST at 11, GFR Est Af Am at 54.  On review of systems, pt reports SOB, abdominal pain and denies unexpected weight loss, chest pain, new bone pain, low appetite, rashes and any other symptoms.   On PMHx the pt reports Gastroparesis, Diabetes, HLD, HTN, Chronic systolic CHF.  INTERVAL HISTORY:  Deborah [name] is a wonderful 54 y.o. female who is here for evaluation and management  of Duodenal Carcinoid. The patient's last visit with [place] was on 09/11/2019. The pt reports that she is doing well overall.  The pt reports that  she is scheduled to see Dr. Barry Dienes on 10/08/19. Dr. Alessandra Bevels is planning on performing an Endoscopy in October. She is being set up for a consulation with a Physician at [facility] with expertise in gastroparesis.    Of note since the patient's last visit, pt has had PET DOTATATE ([id number])  completed on 09/24/2019 with results revealing "No signs of tracer avid solid organ or nodal metastases."  Lab results are as follows:  09/18/2019 Gastrin at 102 09/18/2019 Chromogranin A at 688.6 09/18/2019 Neuron-specific Enolase, Serum at 7.2  On review of systems, pt reports abdominal pain and denies any other symptoms.   MEDICAL HISTORY:  Past Medical History:  Diagnosis Date  . Acute back pain with sciatica, left   . Acute back pain with sciatica, right   . AKI (acute kidney injury) ([place])   . Anemia, unspecified   . Chronic pain   . Chronic systolic CHF (congestive heart failure) ([place])   . Diabetes mellitus   . Esophageal reflux   . Fibromyalgia   . Gastric ulcer   . Gastroparesis   . Gout   . Hyperlipidemia   . Hypertension   . Lumbosacral stenosis   . NICM (nonischemic cardiomyopathy) ([place])   . Obesity   . PAF (paroxysmal atrial fibrillation) ([place])   . Stroke ([place]) 02/2011  . Vitamin B12 deficiency anemia     SURGICAL HISTORY: Past Surgical History:  Procedure Laterality Date  . BIOPSY  07/27/2019   Procedure: BIOPSY;  Surgeon: Clarene Essex, MD;  Location: [facility];  Service: Endoscopy;;  . BIOPSY  07/30/2019   Procedure: BIOPSY;  Surgeon: Otis Brace, MD;  Location: [facility];  Service: Gastroenterology;;  . CATARACT EXTRACTION  01/2014  . CHOLECYSTECTOMY    . COLONOSCOPY WITH PROPOFOL N/A 07/30/2019   Procedure: COLONOSCOPY WITH PROPOFOL;  Surgeon: Otis Brace, MD;  Location: [facility];  Service: Gastroenterology;  Laterality: N/A;  . ESOPHAGOGASTRODUODENOSCOPY N/A 07/27/2019   Procedure: ESOPHAGOGASTRODUODENOSCOPY (EGD);  Surgeon: Clarene Essex, MD;   Location: [facility];  Service: Endoscopy;  Laterality: N/A;  . ESOPHAGOGASTRODUODENOSCOPY (EGD) WITH PROPOFOL N/A 08/02/2019   Procedure: ESOPHAGOGASTRODUODENOSCOPY (EGD) WITH PROPOFOL;  Surgeon: Otis Brace, MD;  Location: [facility];  Service: Gastroenterology;  Laterality: N/A;  . HEMOSTASIS CLIP PLACEMENT  08/02/2019   Procedure: HEMOSTASIS CLIP PLACEMENT;  Surgeon: Otis Brace, MD;  Location: [facility];  Service: Gastroenterology;;  . POLYPECTOMY  07/30/2019   Procedure: POLYPECTOMY;  Surgeon: Otis Brace, MD;  Location: [facility];  Service: Gastroenterology;;  . POLYPECTOMY  08/02/2019   Procedure: POLYPECTOMY;  Surgeon: Otis Brace, MD;  Location: [facility];  Service: Gastroenterology;;    SOCIAL HISTORY: Social History   Socioeconomic History  . Marital status: Single    Spouse name: Not on file  . Number of children: Not on file  . Years of education: Not on file  . Highest education level: Not on file  Occupational History  . Not on file  Tobacco Use  . Smoking status: Never Smoker  . Smokeless tobacco: Never Used  Vaping Use  . Vaping Use: Never used  Substance and Sexual Activity  . Alcohol use: No  . Drug use: No  . Sexual activity: Not Currently    Birth control/protection: None  Other Topics Concern  . Not on file  Social History Narrative   ** Merged  History Encounter **       Social Determinants of Health   Financial Resource Strain:   . Difficulty of Paying Living Expenses:   Food Insecurity:   . Worried About Charity fundraiser in the Last Year:   . Arboriculturist in the Last Year:   Transportation Needs:   . Film/video editor (Medical):   [name] Lack of Transportation (Non-Medical):   Physical Activity:   . Days of Exercise per Week:   . Minutes of Exercise per Session:   Stress:   . Feeling of Stress :   Social Connections:   . Frequency of Communication with Friends and Family:   . Frequency of Social  Gatherings with Friends and Family:   . Attends Religious Services:   . Active Member of Clubs or Organizations:   . Attends Archivist Meetings:   [name] Marital Status:   Intimate Partner Violence:   . Fear of Current or Ex-Partner:   . Emotionally Abused:   [name] Physically Abused:   . Sexually Abused:     FAMILY HISTORY: Family History  Problem Relation Age of Onset  . Diabetes Mother   . Diabetes Father   . Heart disease Father   . Diabetes Sister   . Congestive Heart Failure Sister 48  . Diabetes Brother     ALLERGIES:  is allergic to contrast media [iodinated diagnostic agents], diazepam, isovue [iopamidol], lisinopril, penicillins, acetaminophen, tolmetin, aspirin, metoclopramide, nsaids, and tramadol.  MEDICATIONS:  Current Outpatient Medications  Medication Sig Dispense Refill  . albuterol (PROVENTIL) (2.5 MG/3ML) 0.083% nebulizer solution Take 3 mLs (2.5 mg total) by nebulization every 6 (six) hours as needed for wheezing or shortness of breath. 150 mL 1  . ALPRAZolam (XANAX) 1 MG tablet Take 1 tablet (1 mg total) by mouth 2 (two) times daily as needed for anxiety. 60 tablet 0  . atorvastatin (LIPITOR) 10 MG tablet Take 1 tablet (10 mg total) by mouth daily. 30 tablet 11  . blood glucose meter kit and supplies KIT Dispense based on patient and insurance preference. Use up to four times daily as directed. (FOR ICD-9 250.00, 250.01). 1 each 0  . cetirizine (ZYRTEC) 10 MG tablet Take 1 tablet (10 mg total) by mouth daily. 30 tablet 11  . DULoxetine HCl 40 MG CPEP TAKE ONE CAPSULE BY MOUTH TWICE DAILY (Patient taking differently: Take 40 mg by mouth in the morning and at bedtime. ) 60 capsule 2  . ELIQUIS 5 MG TABS tablet TAKE 1 TABLET BY MOUTH 2 (TWO) TIMES DAILY. 60 tablet 2  . ferrous sulfate 325 (65 FE) MG tablet Take 1 tablet (325 mg total) by mouth daily with breakfast. 30 tablet 0  . fluticasone (FLONASE) 50 MCG/ACT nasal spray Place 2 sprays into both nostrils  daily as needed for allergies or rhinitis. 16 g 2  . hydrALAZINE (APRESOLINE) 25 MG tablet Take 1 tablet (25 mg total) by mouth 3 (three) times daily. For blood pressure 90 tablet 2  . HYDROmorphone (DILAUDID) 2 MG tablet Take 0.5 tablets (1 mg total) by mouth daily as needed for severe pain. 10 tablet 0  . insulin aspart (NOVOLOG FLEXPEN) 100 UNIT/ML FlexPen Inject 15 Units into the skin 3 (three) times daily with meals. (Patient taking differently: Inject 30 Units into the skin 3 (three) times daily with meals. ) 15 mL 0  . isosorbide mononitrate (IMDUR) 60 MG 24 hr tablet Take 1 tablet (60 mg total) by mouth  daily. 30 tablet 1  . LANTUS SOLOSTAR 100 UNIT/ML Solostar Pen Inject 45 Units into the skin at bedtime.    [name] loperamide (IMODIUM) 2 MG capsule Take 2 mg by mouth 4 (four) times daily as needed for diarrhea or loose stools.    . magnesium oxide (MAG-OX) 400 (241.3 Mg) MG tablet Take 1 tablet (400 mg total) by mouth 2 (two) times daily. 60 tablet 0  . melatonin 3 MG TABS tablet TAKE 2 TABLETS BY MOUTH AT BEDTIME. 60 tablet 2  . metoprolol succinate (TOPROL-XL) 25 MG 24 hr tablet TAKE ONE TABLET BY MOUTH ONCE DAILY 30 tablet 2  . nitroGLYCERIN (NITROSTAT) 0.4 MG SL tablet Place 1 tablet (0.4 mg total) under the tongue every 5 (five) minutes as needed for chest pain. 30 tablet 1  . ondansetron (ZOFRAN-ODT) 4 MG disintegrating tablet Take 1 tablet (4 mg total) by mouth every 8 (eight) hours as needed for nausea or vomiting. 30 tablet 0  . pantoprazole (PROTONIX) 40 MG tablet TAKE ONE TABLET BY MOUTH TWICE DAILY 60 tablet 3  . torsemide (DEMADEX) 20 MG tablet Take 2 tablets (40 mg total) by mouth daily. Hold if having significant vomiting     No current facility-administered medications for this visit.    REVIEW OF SYSTEMS:   A 10+ POINT REVIEW OF SYSTEMS WAS OBTAINED including neurology, dermatology, psychiatry, cardiac, respiratory, lymph, extremities, GI, GU, Musculoskeletal, constitutional,  breasts, reproductive, HEENT.  All pertinent positives are noted in the HPI.  All others are negative.   PHYSICAL EXAMINATION: ECOG PERFORMANCE STATUS: 2 - Symptomatic, <50% confined to bed  . Vitals:   10/01/19 1039  BP: (!) 133/88  Pulse: (!) 113  Resp: 18  Temp: (!) 97.5 F (36.4 C)  SpO2: 98%   Filed Weights   10/01/19 1039  Weight: (!) 336 lb 4.8 oz (152.5 kg)   .Body mass index is 54.28 kg/m.  Exam was given in a wheelchair   GENERAL:alert, in no acute distress and comfortable SKIN: no acute rashes, no significant lesions EYES: conjunctiva are pink and non-injected, sclera anicteric OROPHARYNX: MMM, no exudates, no oropharyngeal erythema or ulceration NECK: supple, no JVD LYMPH:  no palpable lymphadenopathy in the cervical, axillary or inguinal regions LUNGS: clear to auscultation b/l with normal respiratory effort HEART: regular rate & rhythm ABDOMEN:  normoactive bowel sounds , non tender, not distended. No palpable hepatosplenomegaly.  Extremity: no pedal edema PSYCH: alert & oriented x 3 with fluent speech NEURO: no focal motor/sensory deficits  LABORATORY DATA:  I have reviewed the data as listed  . CBC Latest Ref Rng & Units 09/03/2019 08/31/2019 08/30/2019  WBC 3.4 - 10.8 x10E3/uL 12.8(H) 12.4(H) 13.4(H)  Hemoglobin 11.1 - 15.9 g/dL 9.6(L) 8.5(L) 8.6(L)  Hematocrit 34.0 - 46.6 % 30.4(L) 27.4(L) 27.8(L)  Platelets 150 - 450 x10E3/uL 451(H) 394 424(H)    . CMP Latest Ref Rng & Units 09/20/2019 09/03/2019 08/31/2019  Glucose 65 - 99 mg/dL 273(H) 371(H) 224(H)  BUN 6 - 24 mg/dL 18 33(H) 28(H)  Creatinine 0.57 - 1.00 mg/dL 1.24(H) 1.81(H) 1.51(H)  Sodium 134 - 144 mmol/L 137 136 137  Potassium 3.5 - 5.2 mmol/L 3.8 4.1 3.3(L)  Chloride 96 - 106 mmol/L 100 99 99  CO2 20 - 29 mmol/L '23 20 23  ' Calcium 8.7 - 10.2 mg/dL 9.2 9.3 9.2  Total Protein 6.5 - 8.1 g/dL - - -  Total Bilirubin 0.3 - 1.2 mg/dL - - -  Alkaline Phos 38 - 126  U/L - - -  AST 15 - 41 U/L - -  -  ALT 0 - 44 U/L - - -     RADIOGRAPHIC STUDIES: I have personally reviewed the radiological images as listed and agreed with the findings in the report. NM PET (NETSPOT GA 33 DOTATATE) SKULL BASE TO MID THIGH  Result Date: 09/24/2019 CLINICAL DATA:  Initial treatment strategy for duodenal carcinoid. EXAM: NUCLEAR MEDICINE PET SKULL BASE TO THIGH TECHNIQUE: 5.7 mCi Ga 30 DOTATATE was injected intravenously. Full-ring PET imaging was performed from the skull base to thigh after the radiotracer. CT data was obtained and used for attenuation correction and anatomic localization. COMPARISON:  CT AP 08/23/2019 FINDINGS: NECK No radiotracer activity in neck lymph nodes. Incidental CT findings: None CHEST No radiotracer accumulation within mediastinal or hilar lymph nodes. No suspicious pulmonary nodules on the CT scan. Incidental CT finding:Mild aortic atherosclerosis. No pericardial effusion. ABDOMEN/PELVIS No abnormal radiotracer activity identified within the liver, pancreas, spleen, or adrenal glands. No tracer avid lymph nodes within the abdomen or pelvis. No abnormal tracer uptake identified within the bowel loops. Physiologic activity noted in the liver, spleen, adrenal glands and kidneys. Incidental CT findings:Exophytic uterine fibroid arising from the left side of fundus is again noted and measures 5.1 cm, image 157/4. SKELETON No focal activity to suggest skeletal metastasis. Incidental CT findings:None IMPRESSION: 1. No signs of tracer avid solid organ or nodal metastases. Electronically Signed   By: Kerby Moors M.D.   On: 09/24/2019 17:05    ASSESSMENT & PLAN:   54 year old African-American female with multiple medical comorbidities with  #1 Newly diagnosed low-grade well-differentiated duodenal neuroendocrine/carcinoid tumor noted on EGD in duodenal bulb polyp endoscopic biopsy on 07/27/2019-done by Dr. Watt Climes. Repeat EGD was done on 08/02/2019-showed 1 nonbleeding superficial duodenal  ulcer with no stigmata of bleeding in the duodenal bulb measuring 4 mm.  A single 12 mm sessile polyp with bleeding was noted in the duodenal bulb.  Polyp was removed with piecemeal technique using hot snare.  Hemostatic clip was placed. Pathology from 08/02/2019 confirmed low-grade carcinoid tumor with lymphovascular invasion.  Tumor was broadly present with multiple tissue edges suggesting likely involvement of margins.  PLAN: -Discussed pt labwork, 09/18/19; Gastrin & Neuron-specific Enolase, Serum are WNL, Chromogranin A is significantly elevated -Discussed 09/24/2019 PET DOTATATE ([id number]) "No signs of tracer avid solid organ or nodal metastases." -Advised pt that an elevated Chromogranin A level does not appear to be caused by her Duodenal Carcinoid as there is no spread visualized on scan. Chromogranin A levels can also be elevated due to renal insufficiency, pancreatits, chronic bronchitis, PPI use etc.  -Recommend pt f/u with Dr. Barry Dienes as scheduled for a surgical evaluation. Advised pt again that surgery may not be the best option for her due to her other medical conditions, but Dr. Barry Dienes will be able to fully assess the risks and benefits.  -Advised pt that if we do not proceed with surgery, we would continue to monitor with scans and Endoscopies intermittently.  -Advised pt that Duodenal Carcinoids are typically slow-growing and can recur in the lymph nodes.  -Advised pt that we would aim to keep Ferritin higher than normal to improve anemia. Recommend pt continue PO Iron.  -Advised pt that controlling Diabetes will help her other medical conditions, such as gastroparesis, and may reduce the chances of recurrence/spread of her Duodenal Carcinoid.  -Recommend pt f/u with PCP for Diabetes management -Recommend pt f/u with Dr. Alessandra Bevels as scheduled -Will see  back in 4 months with labs    FOLLOW UP: RTC with Dr Irene Limbo with labs in 4 months  The total time spent in the appt was 20  minutes and more than 50% was on counseling and direct patient cares.  All of the patient's questions were answered with apparent satisfaction. The patient knows to call the clinic with any problems, questions or concerns.  Sullivan Lone MD [place] AAHIVMS [facility] Hematology/Oncology Physician [facility]  (Office):       [phone] (Work cell):  [phone] (Fax):           [phone]  10/01/2019 11:17 AM  I, Yevette Edwards, am acting as a scribe for Dr. Sullivan Lone.   .I have reviewed the above documentation for accuracy and completeness, and I agree with the above. Brunetta Genera MD

## 2019-10-02 ENCOUNTER — Other Ambulatory Visit: Payer: Self-pay | Admitting: Family Medicine

## 2019-10-03 ENCOUNTER — Ambulatory Visit: Payer: Medicare Other | Admitting: Hematology

## 2019-10-08 ENCOUNTER — Other Ambulatory Visit: Payer: Self-pay

## 2019-10-08 DIAGNOSIS — C7A010 Malignant carcinoid tumor of the duodenum: Secondary | ICD-10-CM

## 2019-10-08 NOTE — Progress Notes (Signed)
I received a message from Dr. Barry Dienes regarding this patient.  She is requesting she be seen in [facility] for recurrent rectal bleeding.  I have spoken to the patient and she is coming in tomorrow 8/3 at 9:30 for lab work and then seen Sandi Mealy PA-C in our Symptom Management Clinic at 10:00.  I have placed orders for CBC w/diff and Sample to blood bank.

## 2019-10-09 ENCOUNTER — Inpatient Hospital Stay: Payer: Medicare Other | Attending: Hematology

## 2019-10-09 ENCOUNTER — Telehealth: Payer: Self-pay | Admitting: Family Medicine

## 2019-10-09 ENCOUNTER — Inpatient Hospital Stay: Payer: Medicare Other

## 2019-10-09 ENCOUNTER — Inpatient Hospital Stay (HOSPITAL_BASED_OUTPATIENT_CLINIC_OR_DEPARTMENT_OTHER): Payer: Medicare Other | Admitting: Medical

## 2019-10-09 ENCOUNTER — Other Ambulatory Visit: Payer: Self-pay

## 2019-10-09 VITALS — BP 145/89 | HR 101 | Temp 97.5°F | Resp 20 | Ht 66.0 in | Wt 329.0 lb

## 2019-10-09 DIAGNOSIS — I110 Hypertensive heart disease with heart failure: Secondary | ICD-10-CM | POA: Diagnosis not present

## 2019-10-09 DIAGNOSIS — C7A010 Malignant carcinoid tumor of the duodenum: Secondary | ICD-10-CM | POA: Diagnosis not present

## 2019-10-09 DIAGNOSIS — I5022 Chronic systolic (congestive) heart failure: Secondary | ICD-10-CM | POA: Insufficient documentation

## 2019-10-09 DIAGNOSIS — I480 Paroxysmal atrial fibrillation: Secondary | ICD-10-CM | POA: Diagnosis not present

## 2019-10-09 DIAGNOSIS — Z8719 Personal history of other diseases of the digestive system: Secondary | ICD-10-CM | POA: Diagnosis not present

## 2019-10-09 DIAGNOSIS — Z8673 Personal history of transient ischemic attack (TIA), and cerebral infarction without residual deficits: Secondary | ICD-10-CM | POA: Diagnosis not present

## 2019-10-09 DIAGNOSIS — G8929 Other chronic pain: Secondary | ICD-10-CM

## 2019-10-09 LAB — CBC WITH DIFFERENTIAL (CANCER CENTER ONLY)
Abs Immature Granulocytes: 0.03 10*3/uL (ref 0.00–0.07)
Basophils Absolute: 0.1 10*3/uL (ref 0.0–0.1)
Basophils Relative: 1 %
Eosinophils Absolute: 0.3 10*3/uL (ref 0.0–0.5)
Eosinophils Relative: 3 %
HCT: 28.1 % — ABNORMAL LOW (ref 36.0–46.0)
Hemoglobin: 8.8 g/dL — ABNORMAL LOW (ref 12.0–15.0)
Immature Granulocytes: 0 %
Lymphocytes Relative: 25 %
Lymphs Abs: 2.2 10*3/uL (ref 0.7–4.0)
MCH: 27.6 pg (ref 26.0–34.0)
MCHC: 31.3 g/dL (ref 30.0–36.0)
MCV: 88.1 fL (ref 80.0–100.0)
Monocytes Absolute: 0.5 10*3/uL (ref 0.1–1.0)
Monocytes Relative: 5 %
Neutro Abs: 6 10*3/uL (ref 1.7–7.7)
Neutrophils Relative %: 66 %
Platelet Count: 348 10*3/uL (ref 150–400)
RBC: 3.19 MIL/uL — ABNORMAL LOW (ref 3.87–5.11)
RDW: 15 % (ref 11.5–15.5)
WBC Count: 9.1 10*3/uL (ref 4.0–10.5)
nRBC: 0 % (ref 0.0–0.2)

## 2019-10-09 LAB — SAMPLE TO BLOOD BANK

## 2019-10-09 NOTE — Telephone Encounter (Signed)
Deborah [name] called concerning a form they faxed to [place] on 09-27-2019 for this patients procedure set for 11-27-19.Form is in [place]'s box to be completed. Please fax back to [facility] as soon as you can.

## 2019-10-09 NOTE — Progress Notes (Signed)
Hgb 8.8 per lab call at 0935, sample to blood bank sent per protocol.  Pt seen by PA Lucianne Lei only, no assessment by [facility] RN at this time (d/t time constraints).  PA aware.

## 2019-10-09 NOTE — Telephone Encounter (Signed)
Patient has complex medical history and needs thorough review.  Will complete by next Tuesday.

## 2019-10-09 NOTE — Progress Notes (Signed)
Symptoms Management Clinic Progress Note   Deborah [name] [id number] 1965/12/05 54 y.o.  Deborah [name] is managed by Dr. Sullivan Lone  Actively treated with chemotherapy/immunotherapy/hormonal therapy: no  Next scheduled appointment with provider:02/04/2020  Assessment: Plan:    Other chronic pain  Malignant carcinoid tumor of duodenum ([place])  History of rectal bleeding   Chronic pain: The patient and the individuals that are accompanying her today ask about management of her pain.  On further inquiry the patient then states that she has an appointment with pain management tomorrow.  I deferred to address her pain concerns further.  I did discuss with her that I do not believe that her pain is in any way associated with her malignant carcinoid tumor of the duodenum.  Malignant carcinoid tumor of the duodenum: The patient was recently been seen by Dr. Barry Dienes with the patient indicating that she may have an endoscopic procedure performed with no plans for an open surgical procedure.  GI bleeding: Patient reports that she has had GI bleeding for the on and off for the past 2 weeks.  A CBC returned today with a hemoglobin of 8.8 and a hematocrit of 28.1.  There are no indications for a transfusion today.  Please see After Visit Summary for patient specific instructions.  Future Appointments  Date Time Provider [place]  10/10/2019  1:20 PM Izora Ribas, MD [facility]  10/11/2019  9:45 AM [facility]-CASE MANAGER [facility] [place]  10/22/2019  3:30 PM Cleophas Dunker, DO [facility] [place]  10/30/2019  9:00 AM Sueanne Margarita, MD [facility]  11/23/2019  9:45 AM MC-SCREENING [facility] None  02/04/2020  1:45 PM [facility] LAB 2 [facility] None  02/04/2020  2:20 PM Brunetta Genera, MD [facility] None    No orders of the defined types were placed in this encounter.      Subjective:   Patient ID:  Deborah [name] is a 54 y.o. (DOB  Nov 14, 1965) female.  Chief Complaint:  Chief Complaint  Patient presents with  . Rectal Bleeding    HPI Deborah [name] is a 54 y.o. female with a diagnosis of a malignant carcinoid tumor of the duodenum.  She has been seen by Dr. Sullivan Lone.  She was recently seen by Dr. Barry Dienes with the patient indicating that she may have an endoscopic procedure performed with no plans for an open surgical procedure.  She presents to the clinic today with a 2-week history of bright red blood per rectum. She is seen in the clinic today after being sent over for Dr. Barry Dienes to determine if she needs a transfusion. She and her companion inquire as to what I am "going to do about her pain" initially before indicating to me that she has an appointment in the pain management clinic tomorrow. Before this, I had indicated that she may need a referral to pain management. To this she asks if I can get her in to be seen today.   Medications: I have reviewed the patient's current medications.  Allergies:  Allergies  Allergen Reactions  . Contrast Media [Iodinated Diagnostic Agents] Anaphylaxis    Cardiac arrest  . Diazepam [name]  . Isovue [Iopamidol] Anaphylaxis    Patient had seizure like activity and then code post 100 cc of isovue 300  . Lisinopril Anaphylaxis    Tongue and mouth swelling  . Penicillins Palpitations    Has patient had a PCN reaction causing immediate rash, facial/tongue/throat swelling, SOB or  lightheadedness with hypotension: Yes, heart races Has patient had a PCN reaction causing severe rash involving mucus membranes or skin necrosis: No Has patient had a PCN reaction that required hospitalization: Yes  Has patient had a PCN reaction occurring within the last 10 years: No   . Acetaminophen Nausea Only and Other (See Comments)    Irritates stomach ulcer  Abdominal pain  . Tolmetin Nausea Only and Other (See Comments)    ULCER  . Aspirin Other (See Comments)     Irritates stomach ulcer   . Metoclopramide Other (See Comments)    Tardive dyskinesia  . Nsaids Other (See Comments)    ULCER  . Tramadol Nausea And Vomiting    Past Medical History:  Diagnosis Date  . Acute back pain with sciatica, left   . Acute back pain with sciatica, right   . AKI (acute kidney injury) ([place])   . Anemia, unspecified   . Chronic pain   . Chronic systolic CHF (congestive heart failure) ([place])   . Diabetes mellitus   . Esophageal reflux   . Fibromyalgia   . Gastric ulcer   . Gastroparesis   . Gout   . Hyperlipidemia   . Hypertension   . Lumbosacral stenosis   . NICM (nonischemic cardiomyopathy) ([place])   . Obesity   . PAF (paroxysmal atrial fibrillation) ([place])   . Stroke ([place]) 02/2011  . Vitamin B12 deficiency anemia     Past Surgical History:  Procedure Laterality Date  . BIOPSY  07/27/2019   Procedure: BIOPSY;  Surgeon: Clarene Essex, MD;  Location: [facility];  Service: Endoscopy;;  . BIOPSY  07/30/2019   Procedure: BIOPSY;  Surgeon: Otis Brace, MD;  Location: [facility];  Service: Gastroenterology;;  . CATARACT EXTRACTION  01/2014  . CHOLECYSTECTOMY    . COLONOSCOPY WITH PROPOFOL N/A 07/30/2019   Procedure: COLONOSCOPY WITH PROPOFOL;  Surgeon: Otis Brace, MD;  Location: [facility];  Service: Gastroenterology;  Laterality: N/A;  . ESOPHAGOGASTRODUODENOSCOPY N/A 07/27/2019   Procedure: ESOPHAGOGASTRODUODENOSCOPY (EGD);  Surgeon: Clarene Essex, MD;  Location: [facility];  Service: Endoscopy;  Laterality: N/A;  . ESOPHAGOGASTRODUODENOSCOPY (EGD) WITH PROPOFOL N/A 08/02/2019   Procedure: ESOPHAGOGASTRODUODENOSCOPY (EGD) WITH PROPOFOL;  Surgeon: Otis Brace, MD;  Location: [facility];  Service: Gastroenterology;  Laterality: N/A;  . HEMOSTASIS CLIP PLACEMENT  08/02/2019   Procedure: HEMOSTASIS CLIP PLACEMENT;  Surgeon: Otis Brace, MD;  Location: [facility];  Service: Gastroenterology;;  . POLYPECTOMY  07/30/2019   Procedure:  POLYPECTOMY;  Surgeon: Otis Brace, MD;  Location: [facility];  Service: Gastroenterology;;  . POLYPECTOMY  08/02/2019   Procedure: POLYPECTOMY;  Surgeon: Otis Brace, MD;  Location: [facility];  Service: Gastroenterology;;    Family History  Problem Relation Age of Onset  . Diabetes Mother   . Diabetes Father   . Heart disease Father   . Diabetes Sister   . Congestive Heart Failure Sister 54  . Diabetes Brother     Social History   Socioeconomic History  . Marital status: Single    Spouse name: Not on file  . Number of children: Not on file  . Years of education: Not on file  . Highest education level: Not on file  Occupational History  . Not on file  Tobacco Use  . Smoking status: Never Smoker  . Smokeless tobacco: Never Used  Vaping Use  . Vaping Use: Never used  Substance and Sexual Activity  . Alcohol use: No  . Drug use: No  . Sexual activity:  Not Currently    Birth control/protection: None  Other Topics Concern  . Not on file  Social History Narrative   ** Merged History Encounter **       Social Determinants of Health   Financial Resource Strain:   . Difficulty of Paying Living Expenses:   Food Insecurity:   . Worried About Charity fundraiser in the Last Year:   . Arboriculturist in the Last Year:   Transportation Needs:   . Film/video editor (Medical):   [name] Lack of Transportation (Non-Medical):   Physical Activity:   . Days of Exercise per Week:   . Minutes of Exercise per Session:   Stress:   . Feeling of Stress :   Social Connections:   . Frequency of Communication with Friends and Family:   . Frequency of Social Gatherings with Friends and Family:   . Attends Religious Services:   . Active Member of Clubs or Organizations:   . Attends Archivist Meetings:   [name] Marital Status:   Intimate Partner Violence:   . Fear of Current or Ex-Partner:   . Emotionally Abused:   [name] Physically Abused:   . Sexually Abused:      Past Medical History, Surgical history, Social history, and Family history were reviewed and updated as appropriate.   Please see review of systems for further details on the patient's review from today.   Review of Systems:  Review of Systems  Constitutional: Negative for activity change, appetite change, chills, diaphoresis and fever.  HENT: Negative for trouble swallowing.   Respiratory: Negative for cough, chest tightness and shortness of breath.   Cardiovascular: Negative for chest pain, palpitations and leg swelling.  Gastrointestinal: Positive for abdominal pain, anal bleeding and blood in stool. Negative for abdominal distention, constipation, diarrhea, nausea and vomiting.    Objective:   Physical Exam:  BP (!) 145/89 (BP Location: Right Wrist, Patient Position: Sitting) Comment: retake notified nurse  Pulse (!) 101 Comment: notified nurse  Temp (!) 97.5 F (36.4 C) (Temporal)   Resp 20   Ht 5\' 6"  (1.676 m)   Wt (!) 329 lb (149.2 kg)   LMP 10/10/2012   SpO2 99%   BMI 53.10 kg/m  ECOG: 1  Physical Exam Constitutional:      Appearance: She is morbidly obese. She is not ill-appearing.  HENT:     Head: Normocephalic and atraumatic.  Eyes:     General: No scleral icterus.       Right eye: No discharge.        Left eye: No discharge.     Conjunctiva/sclera: Conjunctivae normal.  Cardiovascular:     Rate and Rhythm: Regular rhythm. Tachycardia present.  Pulmonary:     Effort: Pulmonary effort is normal. No respiratory distress.     Breath sounds: Examination of the right-upper field reveals decreased breath sounds. Examination of the left-upper field reveals decreased breath sounds. Examination of the right-middle field reveals decreased breath sounds. Examination of the left-middle field reveals decreased breath sounds. Examination of the right-lower field reveals decreased breath sounds. Examination of the left-lower field reveals decreased breath sounds.  Decreased breath sounds present. No wheezing, rhonchi or rales.  Musculoskeletal:     Right lower leg: Edema present.     Left lower leg: Edema present.     Comments: Bilateral stasis dermatitis  Neurological:     Mental Status: She is alert.     Lab Review:  Component Value Date/Time   NA 137 09/20/2019 1530   K 3.8 09/20/2019 1530   CL 100 09/20/2019 1530   CO2 23 09/20/2019 1530   GLUCOSE 273 (H) 09/20/2019 1530   GLUCOSE 224 (H) 08/31/2019 0523   BUN 18 09/20/2019 1530   CREATININE 1.24 (H) 09/20/2019 1530   CALCIUM 9.2 09/20/2019 1530   PROT 6.6 08/29/2019 0622   ALBUMIN 2.6 (L) 08/29/2019 0622   AST 12 (L) 08/29/2019 0622   ALT 13 08/29/2019 0622   ALKPHOS 87 08/29/2019 0622   BILITOT 0.5 08/29/2019 0622   GFRNONAA 49 (L) 09/20/2019 1530   GFRAA 57 (L) 09/20/2019 1530       Component Value Date/Time   WBC 9.1 10/09/2019 0906   WBC 12.4 (H) 08/31/2019 0523   RBC 3.19 (L) 10/09/2019 0906   HGB 8.8 (L) 10/09/2019 0906   HGB 9.6 (L) 09/03/2019 1620   HCT 28.1 (L) 10/09/2019 0906   HCT 30.4 (L) 09/03/2019 1620   PLT 348 10/09/2019 0906   PLT 451 (H) 09/03/2019 1620   MCV 88.1 10/09/2019 0906   MCV 85 09/03/2019 1620   MCH 27.6 10/09/2019 0906   MCHC 31.3 10/09/2019 0906   RDW 15.0 10/09/2019 0906   RDW 13.6 09/03/2019 1620   LYMPHSABS 2.2 10/09/2019 0906   LYMPHSABS 2.2 09/03/2019 1620   MONOABS 0.5 10/09/2019 0906   EOSABS 0.3 10/09/2019 0906   EOSABS 0.3 09/03/2019 1620   BASOSABS 0.1 10/09/2019 0906   BASOSABS 0.1 09/03/2019 1620   -------------------------------  Imaging from last 24 hours (if applicable):  Radiology interpretation: NM PET (NETSPOT GA 53 DOTATATE) SKULL BASE TO MID THIGH  Result Date: 09/24/2019 CLINICAL DATA:  Initial treatment strategy for duodenal carcinoid. EXAM: NUCLEAR MEDICINE PET SKULL BASE TO THIGH TECHNIQUE: 5.7 mCi Ga 24 DOTATATE was injected intravenously. Full-ring PET imaging was performed from the skull base to  thigh after the radiotracer. CT data was obtained and used for attenuation correction and anatomic localization. COMPARISON:  CT AP 08/23/2019 FINDINGS: NECK No radiotracer activity in neck lymph nodes. Incidental CT findings: None CHEST No radiotracer accumulation within mediastinal or hilar lymph nodes. No suspicious pulmonary nodules on the CT scan. Incidental CT finding:Mild aortic atherosclerosis. No pericardial effusion. ABDOMEN/PELVIS No abnormal radiotracer activity identified within the liver, pancreas, spleen, or adrenal glands. No tracer avid lymph nodes within the abdomen or pelvis. No abnormal tracer uptake identified within the bowel loops. Physiologic activity noted in the liver, spleen, adrenal glands and kidneys. Incidental CT findings:Exophytic uterine fibroid arising from the left side of fundus is again noted and measures 5.1 cm, image 157/4. SKELETON No focal activity to suggest skeletal metastasis. Incidental CT findings:None IMPRESSION: 1. No signs of tracer avid solid organ or nodal metastases. Electronically Signed   By: Kerby Moors M.D.   On: 09/24/2019 17:05        This case was discussed with Dr. Irene Limbo. He expresses agreement with my management of this patient.

## 2019-10-09 NOTE — Patient Instructions (Signed)
COVID-19: How to Protect Yourself and Others Know how it spreads  There is currently no vaccine to prevent coronavirus disease 2019 (COVID-19).  The best way to prevent illness is to avoid being exposed to this virus.  The virus is thought to spread mainly from person-to-person. ? Between people who are in close contact with one another (within about 6 feet). ? Through respiratory droplets produced when an infected person coughs, sneezes or talks. ? These droplets can land in the mouths or noses of people who are nearby or possibly be inhaled into the lungs. ? COVID-19 may be spread by people who are not showing symptoms. Everyone should Clean your hands often  Wash your hands often with soap and water for at least 20 seconds especially after you have been in a public place, or after blowing your nose, coughing, or sneezing.  If soap and water are not readily available, use a hand sanitizer that contains at least 60% alcohol. Cover all surfaces of your hands and rub them together until they feel dry.  Avoid touching your eyes, nose, and mouth with unwashed hands. Avoid close contact  Limit contact with others as much as possible.  Avoid close contact with people who are sick.  Put distance between yourself and other people. ? Remember that some people without symptoms may be able to spread virus. ? This is especially important for people who are at higher risk of getting very sick.www.cdc.gov/coronavirus/2019-ncov/need-extra-precautions/people-at-higher-risk.html Cover your mouth and nose with a mask when around others  You could spread COVID-19 to others even if you do not feel sick.  Everyone should wear a mask in public settings and when around people not living in their household, especially when social distancing is difficult to maintain. ? Masks should not be placed on young children under age 2, anyone who has trouble breathing, or is unconscious, incapacitated or otherwise  unable to remove the mask without assistance.  The mask is meant to protect other people in case you are infected.  Do NOT use a facemask meant for a healthcare worker.  Continue to keep about 6 feet between yourself and others. The mask is not a substitute for social distancing. Cover coughs and sneezes  Always cover your mouth and nose with a tissue when you cough or sneeze or use the inside of your elbow.  Throw used tissues in the trash.  Immediately wash your hands with soap and water for at least 20 seconds. If soap and water are not readily available, clean your hands with a hand sanitizer that contains at least 60% alcohol. Clean and disinfect  Clean AND disinfect frequently touched surfaces daily. This includes tables, doorknobs, light switches, countertops, handles, desks, phones, keyboards, toilets, faucets, and sinks. www.cdc.gov/coronavirus/2019-ncov/prevent-getting-sick/disinfecting-your-home.html  If surfaces are dirty, clean them: Use detergent or soap and water prior to disinfection.  Then, use a household disinfectant. You can see a list of EPA-registered household disinfectants here. cdc.gov/coronavirus 11/08/2018 This information is not intended to replace advice given to you by your health care provider. Make sure you discuss any questions you have with your health care provider. Document Revised: 11/16/2018 Document Reviewed: 09/14/2018 Elsevier Patient Education  2020 [employer]   

## 2019-10-10 ENCOUNTER — Encounter
Payer: Medicare Other | Attending: Physical Medicine and Rehabilitation | Admitting: Physical Medicine and Rehabilitation

## 2019-10-10 ENCOUNTER — Encounter: Payer: Self-pay | Admitting: Physical Medicine and Rehabilitation

## 2019-10-10 VITALS — BP 155/88 | HR 103 | Temp 98.6°F | Ht 66.0 in | Wt 329.0 lb

## 2019-10-10 DIAGNOSIS — C7A010 Malignant carcinoid tumor of the duodenum: Secondary | ICD-10-CM | POA: Insufficient documentation

## 2019-10-10 DIAGNOSIS — R1010 Upper abdominal pain, unspecified: Secondary | ICD-10-CM | POA: Insufficient documentation

## 2019-10-10 DIAGNOSIS — G893 Neoplasm related pain (acute) (chronic): Secondary | ICD-10-CM | POA: Diagnosis present

## 2019-10-10 DIAGNOSIS — R5381 Other malaise: Secondary | ICD-10-CM

## 2019-10-10 DIAGNOSIS — Z5181 Encounter for therapeutic drug level monitoring: Secondary | ICD-10-CM | POA: Insufficient documentation

## 2019-10-10 DIAGNOSIS — G894 Chronic pain syndrome: Secondary | ICD-10-CM | POA: Insufficient documentation

## 2019-10-10 DIAGNOSIS — Z79891 Long term (current) use of opiate analgesic: Secondary | ICD-10-CM | POA: Insufficient documentation

## 2019-10-10 MED ORDER — PREGABALIN 25 MG PO CAPS: 25.0000 mg | ORAL_CAPSULE | Freq: Every day | ORAL | 0 refills | Status: DC

## 2019-10-10 NOTE — Progress Notes (Signed)
Subjective:    Patient ID: Deborah [name], female    DOB: Jun 14, 1965, 54 y.o.   MRN: [id number]  HPI Deborah [name] presents with 1 year of chronic pain syndrome. She has a history of malignant carcinoid tumor of the duodenum. She was diagnosed with cancer in May. Her tumor was resected. She has diabetes and HTN and CHF and due to these risk factors she cannot have further surgery. She has not yet discussed chemotherapy or radiation. She is currently having rectal bleeding and followed with a specialist yesterday. She was referred to a pain clinic to see if her pain can be better controlled and if she can stay out of the hospital. She has had to miss multiple doctor's visits in the past because of her prolonged hospital stays.   The pain is present on both sides of her body in her upper and lower extremities. It is worst in her belly, radiates down her arms, and her legs and feet. The pain feels sharp in the belly and aching in her extremities. She has had benefit from Dilaudid. She has not had benefit from Morphine- it worked for a while and the benefits stopped. The pain makes her nauseous. She was given 7 days of Dilaudid by her physician. She is allergic to Tramadol (she gets tremors and palpitations). She has not tried Lyrica. She has had leg weakness with Gabapentin. She on Cymbalta for fibromyalgia. She has never tried Amitriptyline. The 2mg  of Dilaudid takes the edge of her pain. She takes this once per day and if the pain is very severe she takes another.   She is leaving for [place] tomorrow to get a second opinion for her cancer.   She also takes Xanax for her anxiety attacks.    She had her family reunion and was not able to enjoy it. She feels tired and unable to enjoy life.   She is not able to do physical therapy because it hurts her so badly to walk to her car. She has a bedside commode.   She is accompanied for Pain Inventory Average Pain 10 Pain Right Now 10 My  pain is constant, sharp and aching  In the last 24 hours, has pain interfered with the following? General activity 10 Relation with others 10 Enjoyment of life 10 What TIME of day is your pain at its worst? All the time. Sleep (in general) Poor  Pain is worse with: I sit in my wheelchair all the time.  Pain improves with: Nothing makes it better. Relief from Meds: Takes the edge off..  Mobility walk with assistance use a cane use a walker how many minutes can you walk? Only to the bathroom maybe one  minute. ability to climb steps?  no do you drive?  no  Function not employed: date last employed 2017 disabled: date disabled 2017 I need assistance with the following:  dressing, bathing, toileting, meal prep, household duties and shopping Do you have any goals in this area?  yes  Neuro/Psych bowel control problems weakness tremor trouble walking depression anxiety  Prior Studies New Patient  Physicians involved in your care New Patient   Family History  Problem Relation Age of Onset  . Diabetes Mother   . Diabetes Father   . Heart disease Father   . Diabetes Sister   . Congestive Heart Failure Sister 46  . Diabetes Brother    Social History   Socioeconomic History  . Marital status: Single    Spouse  name: Not on file  . Number of children: Not on file  . Years of education: Not on file  . Highest education level: Not on file  Occupational History  . Not on file  Tobacco Use  . Smoking status: Never Smoker  . Smokeless tobacco: Never Used  Vaping Use  . Vaping Use: Never used  Substance and Sexual Activity  . Alcohol use: No  . Drug use: No  . Sexual activity: Not Currently    Birth control/protection: None  Other Topics Concern  . Not on file  Social History Narrative   ** Merged History Encounter **       Social Determinants of Health   Financial Resource Strain:   . Difficulty of Paying Living Expenses:   Food Insecurity:   . Worried  About Charity fundraiser in the Last Year:   . Arboriculturist in the Last Year:   Transportation Needs:   . Film/video editor (Medical):   [name] Lack of Transportation (Non-Medical):   Physical Activity:   . Days of Exercise per Week:   . Minutes of Exercise per Session:   Stress:   . Feeling of Stress :   Social Connections:   . Frequency of Communication with Friends and Family:   . Frequency of Social Gatherings with Friends and Family:   . Attends Religious Services:   . Active Member of Clubs or Organizations:   . Attends Archivist Meetings:   [name] Marital Status:    Past Surgical History:  Procedure Laterality Date  . BIOPSY  07/27/2019   Procedure: BIOPSY;  Surgeon: Clarene Essex, MD;  Location: [facility];  Service: Endoscopy;;  . BIOPSY  07/30/2019   Procedure: BIOPSY;  Surgeon: Otis Brace, MD;  Location: [facility];  Service: Gastroenterology;;  . CATARACT EXTRACTION  01/2014  . CHOLECYSTECTOMY    . COLONOSCOPY WITH PROPOFOL N/A 07/30/2019   Procedure: COLONOSCOPY WITH PROPOFOL;  Surgeon: Otis Brace, MD;  Location: [facility];  Service: Gastroenterology;  Laterality: N/A;  . ESOPHAGOGASTRODUODENOSCOPY N/A 07/27/2019   Procedure: ESOPHAGOGASTRODUODENOSCOPY (EGD);  Surgeon: Clarene Essex, MD;  Location: [facility];  Service: Endoscopy;  Laterality: N/A;  . ESOPHAGOGASTRODUODENOSCOPY (EGD) WITH PROPOFOL N/A 08/02/2019   Procedure: ESOPHAGOGASTRODUODENOSCOPY (EGD) WITH PROPOFOL;  Surgeon: Otis Brace, MD;  Location: [facility];  Service: Gastroenterology;  Laterality: N/A;  . HEMOSTASIS CLIP PLACEMENT  08/02/2019   Procedure: HEMOSTASIS CLIP PLACEMENT;  Surgeon: Otis Brace, MD;  Location: [facility];  Service: Gastroenterology;;  . POLYPECTOMY  07/30/2019   Procedure: POLYPECTOMY;  Surgeon: Otis Brace, MD;  Location: [facility];  Service: Gastroenterology;;  . POLYPECTOMY  08/02/2019   Procedure: POLYPECTOMY;  Surgeon:  Otis Brace, MD;  Location: [facility];  Service: Gastroenterology;;   Past Medical History:  Diagnosis Date  . Acute back pain with sciatica, left   . Acute back pain with sciatica, right   . AKI (acute kidney injury) ([place])   . Anemia, unspecified   . Chronic pain   . Chronic systolic CHF (congestive heart failure) ([place])   . Diabetes mellitus   . Esophageal reflux   . Fibromyalgia   . Gastric ulcer   . Gastroparesis   . Gout   . Hyperlipidemia   . Hypertension   . Lumbosacral stenosis   . NICM (nonischemic cardiomyopathy) ([place])   . Obesity   . PAF (paroxysmal atrial fibrillation) ([place])   . Stroke ([place]) 02/2011  . Vitamin B12 deficiency anemia  BP (!) 155/88   Pulse (!) 103   Temp 98.6 F (37 C)   Ht 5\' 6"  (1.676 m)   Wt (!) 329 lb (149.2 kg) Comment: Per patient. She was not able to stand  LMP 10/10/2012   BMI 53.10 kg/m   Opioid Risk Score:   Fall Risk Score:  `1  Depression screen PHQ 2/9  Depression screen [facility] 2/9 10/10/2019 09/20/2019 09/03/2019 06/04/2019 02/16/2019 12/13/2018 12/13/2018  Decreased Interest 3 3 0 3 3 3 3   Down, Depressed, Hopeless 3 3 3 3  - 3 3  PHQ - 2 Score 6 6 3 6 3 6 6   Altered sleeping 3 3 - - - 3 3  Tired, decreased energy 3 3 - - - 3 3  Change in appetite 3 3 - - - 3 3  Feeling bad or failure about yourself  2 2 - - - 3 3  Trouble concentrating 3 3 - - - 3 3  Moving slowly or fidgety/restless 3 1 - - - 2 2  Suicidal thoughts 0 1 - - - 1 1  PHQ-9 Score 23 - - - - 24 24  Difficult doing work/chores - Very difficult - - - - -   Review of Systems  Constitutional: Negative.   HENT: Negative.   Eyes: Negative.   Respiratory: Positive for shortness of breath.   Gastrointestinal: Positive for diarrhea, nausea and vomiting.  Endocrine: Negative.   Genitourinary:       Urinary retention  Musculoskeletal: Positive for gait problem.  Allergic/Immunologic: Negative.   Neurological: Positive for tremors.  Hematological:        Anxiety, depression  All other systems reviewed and are negative.      Objective:   Physical Exam Gen: no distress, normal appearing HEENT: oral mucosa pink and moist, NCAT Cardio: Reg rate Chest: normal effort, normal rate of breathing Abd: soft, non-distended Ext: no edema Skin: intact Neuro: Musculoskeletal: Psych: pleasant, normal affect    Assessment & Plan:  Mrs. Szalkowski is a 54 year old woman who presents to establish care or severe cancer related pain.   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 She spent 2 years in a nursing home because of severe spinal stenosis. Her legs would give out due to weakness.   -She lives at home by herself but has the support of her mother and brother who live near by.  -She has severe cancer related pain that is inhibiting her quality of life. She has had benefit from Dilaudid 2mg  before and has been going to the ED multiple times in order to get this medication. She was referred to our practice for pain management. Pain contract signed and UDS obtained today. Once results are available I can prescribe controlled substance for patient. Discussed that our goals are pain relief, improved quality of life. She has stated goals of engaging more with her family. I encouraged her to make a list of her goals for [place] to work on during our monthly visits.   -Discussed her oncological treatment plan. She will be going to [place] this week for a second opinion.   Morbid obesity -Weight is 329 lbs. Encourage physical activity and healthy diet.   Impaired mobility and ADLs: -Provided with handicap placard  All questions answered. RTC in 4 weeks.

## 2019-10-11 ENCOUNTER — Ambulatory Visit: Payer: Self-pay | Admitting: Licensed Clinical Social Worker

## 2019-10-11 ENCOUNTER — Ambulatory Visit: Payer: Medicare Other

## 2019-10-11 ENCOUNTER — Other Ambulatory Visit: Payer: Self-pay

## 2019-10-11 NOTE — Patient Instructions (Signed)
Visit Information  Goals Addressed              This Visit's Progress     I been in the hospital 2-3 times for my Blood sugar (pt-stated)        CARE PLAN ENTRY (see longtitudinal plan of care for additional care plan information)  Objective:  Lab Results  Component Value Date   HGBA1C 8.7 (H) 07/22/2019    Lab Results  Component Value Date   CREATININE 1.81 (H) 09/03/2019   CREATININE 1.51 (H) 08/31/2019   CREATININE 1.49 (H) 08/30/2019     No results found for: EGFR  Current Barriers:   Knowledge Deficits related to basic Diabetes pathophysiology and self care/management   Case Manager Clinical Goal(s):  Over the next 30 days, patient will demonstrate improved adherence to prescribed treatment plan for diabetes self care/management as evidenced by: remaining out of the hospital   Interventions:   Provided education to patient about basic DM disease process  Reviewed medications with patient and discussed importance of medication adherence  Discussed plans with patient for ongoing care management follow up and provided patient with direct contact information for care management team  Provided patient with written educational materials related to hypo and hyperglycemia and importance of correct treatment  Advised patient, providing education and rationale, to check cbg and record, calling the office for findings outside established parameters.    The patient states that she never has lows.  She only had a low 1 time and then she was clammy and disoriented.  When her blood sugar is high, she will start to vomit.  The highest she stated was in the 400's.  I would like to send the patient some educational material on diabetes Living well with Diabetes, Cirrhosis, Calendar  10/11/19  The patient stated that since she has not been feeling well she has checked her blood sugars but not writing the values down.   She states that they have been good.  I have advised  her to write them down and so we can discuss them at the next call.  Patient Self Care Activities:   Self administers oral medications as prescribed  Attends all scheduled provider appointments  Please see past updates related to this goal by clicking on the "Past Updates" button in the selected goal        I need a walker and recliner        CARE PLAN ENTRY (see longitudinal plan of care for additional care plan information)  Current Barriers:   Patient with chronic medical conditions states she needs a walker and a recliner so that she can raise her feet to prevent swelling,   Acknowledges deficits and needs support, education and care coordination in order to meet this unmet need   Patient received hard Rx from PCP for rolling walker and  recliner, she is having difficulty finding someone that will provide it for her Clinical Goal(s):  Over the next 30 days, patient will receive DME and be able to safely meet needs.  Interventions:  Assessed patient needs, and how currently meeting needs  Collaborated with PCP regarding patient needs and request   Collaborated with CCM RN care manager regarding options that would meet patient needs  CCM RN care manager will explore various options and provide patient with an update 10/11/19  Spoke with the patient this morning she was still resting and sounded groggy   She stated that she was not feeling well.  She had not heard anything from [employer] concerning her rollator or the lift chair  RNCM sent a message [employer] regarding the information.  RNCM received messaged back from [employer] stating that the order is ready for pick up at their local retail store.  They have tried to contact her and been unsuccessful.  RNCM  notified the patient but she states that she has no way to pick up the DME and wanted to know if they can deliver it.  Patient Self Care Activities:  Patient is unable to independently navigate getting DME without care  coordination support  Please see past updates related to this goal by clicking on the "Past Updates" button in the selected goal          Ms. Snoke was given information about Care Management services today including:  1. Care Management services include personalized support from designated clinical staff supervised by her physician, including individualized plan of care and coordination with other care providers 2. 24/7 contact phone numbers for assistance for urgent and routine care needs. 3. The patient may stop CCM services at any time (effective at the end of the month) by phone call to the office staff.  Patient agreed to services and verbal consent obtained.   The patient verbalized understanding of instructions provided today and declined a print copy of patient instruction materials.   The care management team will reach out to the patient again over the next 14 days.   Lazaro Arms RN, BSN, [facility] Care Management Coordinator [place] Phone: [phone] Fax: [phone]

## 2019-10-11 NOTE — Chronic Care Management (AMB) (Signed)
   Social Work  Care Management Collaboration 10/11/2019 Name: [name] MRN: [id number] DOB: [birth year] Deborah [name] is a 54 y.o. year old female who sees Meccariello, Bernita Raisin, DO for primary care.   Intervention: Patient was not interviewed or contacted during this encounter.  LCSW collaborated with CCM RN for ongoing needs. Plan:  1. No further follow up required by LCSW at this time  1. RN care manager will follow up with the patient for ongoing needs.    2. If further intervention or needs are Identified  RN care manager will contact LCSW to provide interventions.    Review of patient status, including review of consultants reports, relevant laboratory and other test results, and collaboration with appropriate care team members and the patient's provider was performed as part of comprehensive patient evaluation and provision of chronic care management services.    Goals Addressed            This Visit's Progress   . COMPLETED: personal care services       CARE PLAN ENTRY (see longitudinal plan of care for additional care plan information)  Current Barriers & Progress:   . Patient with chronic medical conditions needs Support, Education, and Care Coordination to resolve unmet Personal Care needs . Patient contacted personal care agency in [facility] for start date . Patient reports PCS services started [name] July 12th  Clinical Social Work Goal(s):  [name] Over the next 30 to 45 days, patient will have personal care needs met as evident by having PCS Aide in the home assisting with needs.  Interventions provided by LCSW : . Assessed needs, level of care concerns, basic eligibility and provided education on Personal Care Service process,  . Collaborate with primary care provider  and CCM RN ref patient's need . Also discussed additional support options with PACE program, patient in agreement to hear more information.  Verbal permission to make referral . PACE  referral placed Patient has spoken with PACE and waiting on next steps ( Outreach to PACE intake waiting for F/U) . Spoke to patient about CAP program that will provide up to 8 hours of care per day. Provided verbal approval for LCSW to make referral. ( called [facility] to send patient CAP application, left  voice message). Patient Self Care Activities & Deficits:  . Patient is unable to perform ADLs independently without assistance  . Family/support system will continue to assist patient with meeting needs in addition to PCS  . Call [facility] with questions [phone] or [phone]  Please see past updates related to this goal by clicking on the "Past Updates" button in the selected goal      [name], [place] / [place]   [phone] 3:08 PM

## 2019-10-11 NOTE — Chronic Care Management (AMB) (Signed)
Care Management   Follow Up Note   10/11/2019 Name: Deborah [name] MRN: [id number] DOB: Jul 05, 1965  Referred by: Cleophas Dunker, DO Reason for referral : Chronic Care Management (Dm II/ DME+)   Deborah [name] is a 54 y.o. year old female who is a primary care patient of Cleophas Dunker, DO. The care management team was consulted for assistance with care management and care coordination needs.    Review of patient status, including review of consultants reports, relevant laboratory and other test results, and collaboration with appropriate care team members and the patient's provider was performed as part of comprehensive patient evaluation and provision of chronic care management services.    SDOH (Social Determinants of Health) assessments performed: No See Care Plan activities for detailed interventions related to [facility])     Advanced Directives: See Care Plan and Vynca application for related entries.   Goals Addressed              This Visit's Progress     I been in the hospital 2-3 times for my Blood sugar (pt-stated)        CARE PLAN ENTRY (see longtitudinal plan of care for additional care plan information)  Objective:  Lab Results  Component Value Date   HGBA1C 8.7 (H) 07/22/2019    Lab Results  Component Value Date   CREATININE 1.81 (H) 09/03/2019   CREATININE 1.51 (H) 08/31/2019   CREATININE 1.49 (H) 08/30/2019     No results found for: EGFR  Current Barriers:   Knowledge Deficits related to basic Diabetes pathophysiology and self care/management   Case Manager Clinical Goal(s):  Over the next 30 days, patient will demonstrate improved adherence to prescribed treatment plan for diabetes self care/management as evidenced by: remaining out of the hospital   Interventions:   Provided education to patient about basic DM disease process  Reviewed medications with patient and discussed importance of medication  adherence  Discussed plans with patient for ongoing care management follow up and provided patient with direct contact information for care management team  Provided patient with written educational materials related to hypo and hyperglycemia and importance of correct treatment  Advised patient, providing education and rationale, to check cbg and record, calling the office for findings outside established parameters.    The patient states that she never has lows.  She only had a low 1 time and then she was clammy and disoriented.  When her blood sugar is high, she will start to vomit.  The highest she stated was in the 400's.  I would like to send the patient some educational material on diabetes Living well with Diabetes, Cirrhosis, Calendar  10/11/19  The patient stated that since she has not been feeling well she has checked her blood sugars but not writing the values down.   She states that they have been good.  I have advised her to write them down and so we can discuss them at the next call.  Patient Self Care Activities:   Self administers oral medications as prescribed  Attends all scheduled provider appointments  Please see past updates related to this goal by clicking on the "Past Updates" button in the selected goal        I need a walker and recliner        CARE PLAN ENTRY (see longitudinal plan of care for additional care plan information)  Current Barriers:   Patient with chronic medical conditions states she needs a walker  and a recliner so that she can raise her feet to prevent swelling,   Acknowledges deficits and needs support, education and care coordination in order to meet this unmet need   Patient received hard Rx from PCP for rolling walker and  recliner, she is having difficulty finding someone that will provide it for her Clinical Goal(s):  Over the next 30 days, patient will receive DME and be able to safely meet needs.  Interventions:  Assessed  patient needs, and how currently meeting needs  Collaborated with PCP regarding patient needs and request   Collaborated with CCM RN care manager regarding options that would meet patient needs  CCM RN care manager will explore various options and provide patient with an update 10/11/19  Spoke with the patient this morning she was still resting and sounded groggy   She stated that she was not feeling well.  She had not heard anything from [employer] concerning her rollator or the lift chair  RNCM sent a message [employer] regarding the information.  RNCM received messaged back from [employer] stating that the order is ready for pick up at their local retail store.  They have tried to contact her and been unsuccessful.  RNCM  notified the patient but she states that she has no way to pick up the DME and wanted to know if they can deliver it.  Patient Self Care Activities:  Patient is unable to independently navigate getting DME without care coordination support  Please see past updates related to this goal by clicking on the "Past Updates" button in the selected goal           The care management team will reach out to the patient again over the next 14 days.   Lazaro Arms RN, BSN, [facility] Care Management Coordinator [place] Phone: [phone] Fax: [phone]

## 2019-10-14 ENCOUNTER — Other Ambulatory Visit: Payer: Self-pay | Admitting: Physical Medicine and Rehabilitation

## 2019-10-14 DIAGNOSIS — G8929 Other chronic pain: Secondary | ICD-10-CM

## 2019-10-14 LAB — DRUG TOX MONITOR 1 W/CONF, ORAL FLD
Alprazolam: 3.07 ng/mL — ABNORMAL HIGH (ref <0.50)
Amphetamines: NEGATIVE ng/mL (ref <10)
Barbiturates: NEGATIVE ng/mL (ref <10)
Benzodiazepines: POSITIVE ng/mL — AB (ref <0.50)
Buprenorphine: NEGATIVE ng/mL (ref <0.10)
Chlordiazepoxide: NEGATIVE ng/mL (ref <0.50)
Clonazepam: NEGATIVE ng/mL (ref <0.50)
Cocaine: NEGATIVE ng/mL (ref <5.0)
Diazepam: NEGATIVE ng/mL (ref <0.50)
Fentanyl: NEGATIVE ng/mL (ref <0.10)
Flunitrazepam: NEGATIVE ng/mL (ref <0.50)
Flurazepam: NEGATIVE ng/mL (ref <0.50)
Heroin Metabolite: NEGATIVE ng/mL (ref <1.0)
Lorazepam: NEGATIVE ng/mL (ref <0.50)
MARIJUANA: NEGATIVE ng/mL (ref <2.5)
MDMA: NEGATIVE ng/mL (ref <10)
Meprobamate: NEGATIVE ng/mL (ref <2.5)
Methadone: NEGATIVE ng/mL (ref <5.0)
Midazolam: NEGATIVE ng/mL (ref <0.50)
Nicotine Metabolite: NEGATIVE ng/mL (ref <5.0)
Nordiazepam: NEGATIVE ng/mL (ref <0.50)
Opiates: NEGATIVE ng/mL (ref <2.5)
Oxazepam: NEGATIVE ng/mL (ref <0.50)
Phencyclidine: NEGATIVE ng/mL (ref <10)
Tapentadol: NEGATIVE ng/mL (ref <5.0)
Temazepam: NEGATIVE ng/mL (ref <0.50)
Tramadol: NEGATIVE ng/mL (ref <5.0)
Triazolam: NEGATIVE ng/mL (ref <0.50)
Zolpidem: NEGATIVE ng/mL (ref <5.0)

## 2019-10-14 LAB — DRUG TOX ALC METAB W/CON, ORAL FLD: Alcohol Metabolite: NEGATIVE ng/mL (ref <25)

## 2019-10-14 MED ORDER — HYDROMORPHONE HCL 2 MG PO TABS: 1.0000 mg | ORAL_TABLET | Freq: Every day | ORAL | 0 refills | Status: DC | PRN

## 2019-10-17 ENCOUNTER — Ambulatory Visit: Payer: Medicare Other | Admitting: Physician Assistant

## 2019-10-22 ENCOUNTER — Ambulatory Visit: Payer: Medicare Other | Admitting: Family Medicine

## 2019-10-23 ENCOUNTER — Telehealth: Payer: Self-pay

## 2019-10-23 ENCOUNTER — Other Ambulatory Visit: Payer: Self-pay | Admitting: Family Medicine

## 2019-10-23 DIAGNOSIS — B3731 Acute candidiasis of vulva and vagina: Secondary | ICD-10-CM

## 2019-10-23 DIAGNOSIS — B373 Candidiasis of vulva and vagina: Secondary | ICD-10-CM

## 2019-10-23 MED ORDER — FLUCONAZOLE 150 MG PO TABS: 150.0000 mg | ORAL_TABLET | Freq: Once | ORAL | 0 refills | Status: AC

## 2019-10-23 NOTE — Progress Notes (Signed)
Yeast infection after erythromycin use

## 2019-10-23 NOTE — Telephone Encounter (Signed)
Rx sent 

## 2019-10-23 NOTE — Telephone Encounter (Signed)
Patient calls nurse line regarding receiving diflucan for yeast infection. Patient reports that she has been taking erythromycin for the last two months and has developed a yeast infection.   If appropriate, patient would like medication to be sent into [facility].   To PCP  Talbot Grumbling, RN

## 2019-10-24 ENCOUNTER — Telehealth: Payer: Self-pay

## 2019-10-24 ENCOUNTER — Telehealth: Payer: Self-pay | Admitting: *Deleted

## 2019-10-24 NOTE — Telephone Encounter (Signed)
Patient called stating she misplaced her form to take the [facility] for Handicap placard.

## 2019-10-24 NOTE — Telephone Encounter (Signed)
Pt.notified

## 2019-10-24 NOTE — Telephone Encounter (Signed)
I can give her another one the next time she comes in

## 2019-10-24 NOTE — Telephone Encounter (Signed)
Urine drug screen for this encounter is consistent for prescribed medication 

## 2019-10-26 ENCOUNTER — Other Ambulatory Visit: Payer: Self-pay

## 2019-10-26 ENCOUNTER — Ambulatory Visit: Payer: Medicare Other

## 2019-10-27 NOTE — Chronic Care Management (AMB) (Signed)
Care Management   Follow Up Note   10/27/2019 Name: Deborah [name] MRN: [id number] DOB: December 08, 1965  Referred by: Cleophas Dunker, DO Reason for referral : Appointment (DM II)   Deborah [name] is a 54 y.o. year old female who is a primary care patient of Cleophas Dunker, DO. The care management team was consulted for assistance with care management and care coordination needs.    Review of patient status, including review of consultants reports, relevant laboratory and other test results, and collaboration with appropriate care team members and the patient's provider was performed as part of comprehensive patient evaluation and provision of chronic care management services.    SDOH (Social Determinants of Health) assessments performed: No See Care Plan activities for detailed interventions related to [facility])     Advanced Directives: See Care Plan and Vynca application for related entries.   Goals Addressed              This Visit's Progress     I been in the hospital 2-3 times for my Blood sugar (pt-stated)        CARE PLAN ENTRY (see longtitudinal plan of care for additional care plan information)  Objective:  Lab Results  Component Value Date   HGBA1C 8.7 (H) 07/22/2019    Lab Results  Component Value Date   CREATININE 1.81 (H) 09/03/2019   CREATININE 1.51 (H) 08/31/2019   CREATININE 1.49 (H) 08/30/2019     No results found for: EGFR  Current Barriers:   Knowledge Deficits related to basic Diabetes pathophysiology and self care/management   Case Manager Clinical Goal(s):  Over the next 30 days, patient will demonstrate improved adherence to prescribed treatment plan for diabetes self care/management as evidenced by: remaining out of the hospital   Interventions:   Provided education to patient about basic DM disease process  Reviewed medications with patient and discussed importance of medication adherence  Discussed plans with  patient for ongoing care management follow up and provided patient with direct contact information for care management team  Provided patient with written educational materials related to hypo and hyperglycemia and importance of correct treatment  Advised patient, providing education and rationale, to check cbg and record, calling the office for findings outside established parameters.    The patient states that she never has lows.  She only had a low 1 time and then she was clammy and disoriented.  When her blood sugar is high, she will start to vomit.  The highest she stated was in the 400's.  I would like to send the patient some educational material on diabetes Living well with Diabetes, Cirrhosis, Calendar  10/26/19  Spoke with the patient and she states that she went to [place] for a 2nd opinion about the cancer in her stomach and she is glad that she did.  She states that they were able to let her know that "she still has some left" and she is going back to [place] to have it removed. She states that she should be back by the 1st week of September.  The patient asked me to contact [employer] to let them know that she has not had contact from anyone in regards to her rollator or lift chair being delivered.  Since she will be out of town, she does not want anyone to try to deliver it while she is gone.  RNCM sent a message to [employer] per patient request.  Patient Self Care Activities:   Self administers  oral medications as prescribed  Attends all scheduled provider appointments  Please see past updates related to this goal by clicking on the "Past Updates" button in the selected goal          The care management team will reach out to the patient again over the next 14 days.   Lazaro Arms RN, BSN, [facility] Care Management Coordinator [place] Phone: [phone] Fax: [phone]

## 2019-10-27 NOTE — Patient Instructions (Signed)
Visit Information  Goals Addressed              This Visit's Progress   .  I been in the hospital 2-3 times for my Blood sugar (pt-stated)        CARE PLAN ENTRY (see longtitudinal plan of care for additional care plan information)  Objective:  Lab Results  Component Value Date   HGBA1C 8.7 (H) 07/22/2019 .   Lab Results  Component Value Date   CREATININE 1.81 (H) 09/03/2019   CREATININE 1.51 (H) 08/31/2019   CREATININE 1.49 (H) 08/30/2019 .   [name] No results found for: EGFR  Current Barriers:  [name] Knowledge Deficits related to basic Diabetes pathophysiology and self care/management   Case Manager Clinical Goal(s):  Over the next 30 days, patient will demonstrate improved adherence to prescribed treatment plan for diabetes self care/management as evidenced by: remaining out of the hospital   Interventions:  . Provided education to patient about basic DM disease process . Reviewed medications with patient and discussed importance of medication adherence . Discussed plans with patient for ongoing care management follow up and provided patient with direct contact information for care management team . Provided patient with written educational materials related to hypo and hyperglycemia and importance of correct treatment . Advised patient, providing education and rationale, to check cbg and record, calling the office for findings outside established parameters.   . The patient states that she never has lows.  She only had a low 1 time and then she was clammy and disoriented. . When her blood sugar is high, she will start to vomit.  The highest she stated was in the 400's. . I would like to send the patient some educational material on diabetes Living well with Diabetes, Cirrhosis, Calendar . 10/26/19 . Spoke with the patient and she states that she went to [place] for a 2nd opinion about the cancer in her stomach and she is glad that she did.  She states that they were able to  let her know that "she still has some left" and she is going back to [place] to have it removed. She states that she should be back by the 1st week of September. . The patient asked me to contact [employer] to let them know that she has not had contact from anyone in regards to her rollator or lift chair being delivered.  Since she will be out of town, she does not want anyone to try to deliver it while she is gone. [name] RNCM sent a message to [employer] per patient request.  Patient Self Care Activities:  . Self administers oral medications as prescribed . Attends all scheduled provider appointments  Please see past updates related to this goal by clicking on the "Past Updates" button in the selected goal         The patient verbalized understanding of instructions provided today and declined a print copy of patient instruction materials.   The care management team will reach out to the patient again over the next 14 days.   Lazaro Arms RN, BSN, [facility] Care Management Coordinator [place] Phone: [phone] Fax: [phone]

## 2019-10-30 ENCOUNTER — Ambulatory Visit: Payer: Medicare Other | Admitting: Cardiology

## 2019-11-13 ENCOUNTER — Telehealth: Payer: Self-pay

## 2019-11-13 ENCOUNTER — Telehealth: Payer: Medicare Other

## 2019-11-13 NOTE — Telephone Encounter (Signed)
  Care Management   Outreach Note  11/13/2019 Name: Deborah [name] MRN: [id number] DOB: [birth year]  Referred by: Cleophas Dunker, DO Reason for referral : Appointment (DM II)   An unsuccessful telephone outreach was attempted today. The patient was referred to the case management team for assistance with care management and care coordination.   Follow Up Plan: A HIPPA compliant phone message was left for the patient providing contact information and requesting a return call.  The care management team will reach out to the patient again over the next 7-14 days.   Lazaro Arms RN, BSN, [facility] Care Management Coordinator [place] Phone: [phone] Fax: [phone]

## 2019-11-14 ENCOUNTER — Other Ambulatory Visit: Payer: Self-pay | Admitting: Family Medicine

## 2019-11-14 ENCOUNTER — Other Ambulatory Visit: Payer: Self-pay

## 2019-11-14 ENCOUNTER — Encounter: Payer: Self-pay | Admitting: Physical Medicine and Rehabilitation

## 2019-11-14 ENCOUNTER — Encounter
Payer: Medicare Other | Attending: Physical Medicine and Rehabilitation | Admitting: Physical Medicine and Rehabilitation

## 2019-11-14 ENCOUNTER — Other Ambulatory Visit: Payer: Self-pay | Admitting: Physical Medicine and Rehabilitation

## 2019-11-14 VITALS — Temp 98.1°F | Ht 66.0 in | Wt 356.6 lb

## 2019-11-14 DIAGNOSIS — R1010 Upper abdominal pain, unspecified: Secondary | ICD-10-CM | POA: Insufficient documentation

## 2019-11-14 DIAGNOSIS — R5381 Other malaise: Secondary | ICD-10-CM | POA: Diagnosis present

## 2019-11-14 DIAGNOSIS — G8929 Other chronic pain: Secondary | ICD-10-CM

## 2019-11-14 DIAGNOSIS — C7A010 Malignant carcinoid tumor of the duodenum: Secondary | ICD-10-CM | POA: Diagnosis present

## 2019-11-14 DIAGNOSIS — Z79899 Other long term (current) drug therapy: Secondary | ICD-10-CM

## 2019-11-14 DIAGNOSIS — Z5181 Encounter for therapeutic drug level monitoring: Secondary | ICD-10-CM | POA: Insufficient documentation

## 2019-11-14 DIAGNOSIS — G893 Neoplasm related pain (acute) (chronic): Secondary | ICD-10-CM | POA: Diagnosis present

## 2019-11-14 DIAGNOSIS — Z79891 Long term (current) use of opiate analgesic: Secondary | ICD-10-CM | POA: Insufficient documentation

## 2019-11-14 DIAGNOSIS — G894 Chronic pain syndrome: Secondary | ICD-10-CM | POA: Diagnosis not present

## 2019-11-14 MED ORDER — HYDROMORPHONE HCL 2 MG PO TABS: 1.0000 mg | ORAL_TABLET | Freq: Every day | ORAL | 0 refills | Status: DC | PRN

## 2019-11-14 NOTE — Progress Notes (Signed)
Subjective:    Patient ID: Deborah [name], female    DOB: [birth year], 54 y.o.   MRN: [id number]  HPI  Deborah [name] is a 54 year old woman who presents for follow-up of cancer related pain. She has recently went to [place] and was found to have more tumors and underwent surgery. She has been taking 0.5mg  to 1mg  of Dilaudid once per day as needed.   She is supposed to be seeing a psychiatrist for depression and anxiety. She sometimes feels like the room is closed in. It feels really bad this month.  She lost the handicap placard I gave her before.   She does not get outside the house much anymore.   She has spent almost three years now in the wheelchair and wants to get well.    Pain Inventory Average Pain 10 Pain Right Now 10 My pain is constant, sharp, stabbing, tingling and aching  In the last 24 hours, has pain interfered with the following? General activity 10 Relation with others 10 Enjoyment of life 10 What TIME of day is your pain at its worst? morning , daytime, evening and night Sleep (in general) Poor  Pain is worse with: sitting and some activites Pain improves with: rest and medication Relief from Meds: 5  use a walker ability to climb steps?  no do you drive?  no use a wheelchair Do you have any goals in this area?  yes  disabled: date disabled 2018 I need assistance with the following:  dressing, bathing, toileting, meal prep, household duties and shopping Do you have any goals in this area?  yes  weakness numbness tremor tingling trouble walking spasms dizziness confusion depression anxiety  Any changes since last visit?  no  Any changes since last visit?  no    Family History  Problem Relation Age of Onset  . Diabetes Mother   . Diabetes Father   . Heart disease Father   . Diabetes Sister   . Congestive Heart Failure Sister 75  . Diabetes Brother    Social History   Socioeconomic History  . Marital status: Single    Spouse  name: Not on file  . Number of children: Not on file  . Years of education: Not on file  . Highest education level: Not on file  Occupational History  . Not on file  Tobacco Use  . Smoking status: Never Smoker  . Smokeless tobacco: Never Used  Vaping Use  . Vaping Use: Never used  Substance and Sexual Activity  . Alcohol use: No  . Drug use: No  . Sexual activity: Not Currently    Birth control/protection: None  Other Topics Concern  . Not on file  Social History Narrative   ** Merged History Encounter **       Social Determinants of Health   Financial Resource Strain:   . Difficulty of Paying Living Expenses: Not on file  Food Insecurity:   . Worried About Charity fundraiser in the Last Year: Not on file  . Ran Out of Food in the Last Year: Not on file  Transportation Needs:   . Lack of Transportation (Medical): Not on file  . Lack of Transportation (Non-Medical): Not on file  Physical Activity:   . Days of Exercise per Week: Not on file  . Minutes of Exercise per Session: Not on file  Stress:   . Feeling of Stress : Not on file  Social Connections:   . Frequency  of Communication with Friends and Family: Not on file  . Frequency of Social Gatherings with Friends and Family: Not on file  . Attends Religious Services: Not on file  . Active Member of Clubs or Organizations: Not on file  . Attends Archivist Meetings: Not on file  . Marital Status: Not on file   Past Surgical History:  Procedure Laterality Date  . BIOPSY  07/27/2019   Procedure: BIOPSY;  Surgeon: Clarene Essex, MD;  Location: [facility];  Service: Endoscopy;;  . BIOPSY  07/30/2019   Procedure: BIOPSY;  Surgeon: Otis Brace, MD;  Location: [facility];  Service: Gastroenterology;;  . CATARACT EXTRACTION  01/2014  . CHOLECYSTECTOMY    . COLONOSCOPY WITH PROPOFOL N/A 07/30/2019   Procedure: COLONOSCOPY WITH PROPOFOL;  Surgeon: Otis Brace, MD;  Location: [facility];  Service:  Gastroenterology;  Laterality: N/A;  . ESOPHAGOGASTRODUODENOSCOPY N/A 07/27/2019   Procedure: ESOPHAGOGASTRODUODENOSCOPY (EGD);  Surgeon: Clarene Essex, MD;  Location: [facility];  Service: Endoscopy;  Laterality: N/A;  . ESOPHAGOGASTRODUODENOSCOPY (EGD) WITH PROPOFOL N/A 08/02/2019   Procedure: ESOPHAGOGASTRODUODENOSCOPY (EGD) WITH PROPOFOL;  Surgeon: Otis Brace, MD;  Location: [facility];  Service: Gastroenterology;  Laterality: N/A;  . HEMOSTASIS CLIP PLACEMENT  08/02/2019   Procedure: HEMOSTASIS CLIP PLACEMENT;  Surgeon: Otis Brace, MD;  Location: [facility];  Service: Gastroenterology;;  . POLYPECTOMY  07/30/2019   Procedure: POLYPECTOMY;  Surgeon: Otis Brace, MD;  Location: [facility];  Service: Gastroenterology;;  . POLYPECTOMY  08/02/2019   Procedure: POLYPECTOMY;  Surgeon: Otis Brace, MD;  Location: [facility];  Service: Gastroenterology;;   Past Medical History:  Diagnosis Date  . Acute back pain with sciatica, left   . Acute back pain with sciatica, right   . AKI (acute kidney injury) ([place])   . Anemia, unspecified   . Chronic pain   . Chronic systolic CHF (congestive heart failure) ([place])   . Diabetes mellitus   . Esophageal reflux   . Fibromyalgia   . Gastric ulcer   . Gastroparesis   . Gout   . Hyperlipidemia   . Hypertension   . Lumbosacral stenosis   . NICM (nonischemic cardiomyopathy) ([place])   . Obesity   . PAF (paroxysmal atrial fibrillation) ([place])   . Stroke ([place]) 02/2011  . Vitamin B12 deficiency anemia    Temp 98.1 F (36.7 C)   Ht 5\' 6"  (1.676 m)   Wt (!) 356 lb 9.6 oz (161.8 kg)   LMP 10/10/2012   SpO2 97%   BMI 57.56 kg/m   Opioid Risk Score:   Fall Risk Score:  `1  Depression screen PHQ 2/9  Depression screen [facility] 2/9 10/10/2019 09/20/2019 09/03/2019 06/04/2019 02/16/2019 12/13/2018 12/13/2018  Decreased Interest 3 3 0 3 3 3 3   Down, Depressed, Hopeless 3 3 3 3  - 3 3  PHQ - 2 Score 6 6 3 6 3 6 6   Altered sleeping 3 3 -  - - 3 3  Tired, decreased energy 3 3 - - - 3 3  Change in appetite 3 3 - - - 3 3  Feeling bad or failure about yourself  2 2 - - - 3 3  Trouble concentrating 3 3 - - - 3 3  Moving slowly or fidgety/restless 3 1 - - - 2 2  Suicidal thoughts 0 1 - - - 1 1  PHQ-9 Score 23 - - - - 24 24  Difficult doing work/chores - Very difficult - - - - -  Some recent  data might be hidden   Review of Systems  Constitutional: Negative.   HENT: Negative.   Eyes: Negative.   Respiratory: Negative.   Cardiovascular: Negative.   Gastrointestinal: Positive for abdominal pain.  Endocrine: Negative.   Genitourinary: Negative.   Musculoskeletal: Positive for gait problem.  Skin: Negative.   Allergic/Immunologic: Negative.   Neurological: Positive for tremors, weakness and numbness.  Psychiatric/Behavioral:       Anxiety, depression  All other systems reviewed and are negative.      Objective:   Physical Exam Gen: no distress, normal appearing HEENT: oral mucosa pink and moist, NCAT Cardio: Reg rate Chest: normal effort, normal rate of breathing Abd: soft, non-distended Ext: no edema Skin: intact Neuro: Alert and oriented x3 Musculoskeletal: Psych: pleasant, normal affect    Assessment & Plan:  Deborah [name] is a 54 year old woman who presents to establish care or severe cancer related pain. Currently pain is worst in her abdomen where she had her recent surgery.   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 She spent 2 years in a nursing home because of severe spinal stenosis. Her legs would give out due to weakness. She is currently quite immobile. Able to stand and walk to the bathroom but does not stand much beyond this. I have prescribed physical therapy.   -Her husband accompanies her today. She says she has excellent family support.   -She has severe cancer related pain that is inhibiting her quality of life. She has had benefit from Dilaudid 2mg  before and has been  going to the ED multiple times in order to get this medication. She was referred to our practice for pain management. Pain contract signed and UDS obtained previously. Once results are available I can prescribe controlled substance for patient. Discussed that our goals are pain relief, improved quality of life. She has stated goals of engaging more with her family. I encouraged her to make a list of her goals for [place] to work on during our monthly visits. I will refill her Dilaudid as this has been helping her. 8MME.   -Discussed her oncological treatment plan. She is s/p surgery in [place].   Morbid obesity -Weight is 356 lbs. Encourage physical activity and healthy diet. Prescribed physical therapy for her debility.   Impaired mobility and ADLs: -Provided with handicap placard  Depression: She will be following with psych. Recommended daily time outdoor and 5 min medication daily.   All questions answered. RTC in 4 weeks.

## 2019-11-14 NOTE — Telephone Encounter (Signed)
Had discussed with patient at last visit that Xanax would not be refilled.

## 2019-11-15 ENCOUNTER — Telehealth: Payer: Self-pay

## 2019-11-15 ENCOUNTER — Other Ambulatory Visit: Payer: Self-pay

## 2019-11-15 ENCOUNTER — Encounter: Payer: Self-pay | Admitting: Family Medicine

## 2019-11-15 ENCOUNTER — Ambulatory Visit (INDEPENDENT_AMBULATORY_CARE_PROVIDER_SITE_OTHER): Payer: Medicare Other | Admitting: Family Medicine

## 2019-11-15 VITALS — BP 142/86 | HR 101 | Wt 355.4 lb

## 2019-11-15 DIAGNOSIS — E1140 Type 2 diabetes mellitus with diabetic neuropathy, unspecified: Secondary | ICD-10-CM | POA: Diagnosis not present

## 2019-11-15 DIAGNOSIS — Z23 Encounter for immunization: Secondary | ICD-10-CM

## 2019-11-15 DIAGNOSIS — IMO0002 Reserved for concepts with insufficient information to code with codable children: Secondary | ICD-10-CM

## 2019-11-15 DIAGNOSIS — K3184 Gastroparesis: Secondary | ICD-10-CM | POA: Diagnosis not present

## 2019-11-15 DIAGNOSIS — C7A010 Malignant carcinoid tumor of the duodenum: Secondary | ICD-10-CM

## 2019-11-15 DIAGNOSIS — R0902 Hypoxemia: Secondary | ICD-10-CM | POA: Diagnosis not present

## 2019-11-15 DIAGNOSIS — E1143 Type 2 diabetes mellitus with diabetic autonomic (poly)neuropathy: Secondary | ICD-10-CM

## 2019-11-15 DIAGNOSIS — F419 Anxiety disorder, unspecified: Secondary | ICD-10-CM

## 2019-11-15 DIAGNOSIS — E1165 Type 2 diabetes mellitus with hyperglycemia: Secondary | ICD-10-CM | POA: Diagnosis not present

## 2019-11-15 DIAGNOSIS — Z794 Long term (current) use of insulin: Secondary | ICD-10-CM | POA: Diagnosis not present

## 2019-11-15 LAB — POCT GLYCOSYLATED HEMOGLOBIN (HGB A1C): HbA1c, POC (controlled diabetic range): 9.3 % — AB (ref 0.0–7.0)

## 2019-11-15 MED ORDER — LANTUS SOLOSTAR 100 UNIT/ML ~~LOC~~ SOPN: 50.0000 [IU] | PEN_INJECTOR | Freq: Every day | SUBCUTANEOUS | Status: DC

## 2019-11-15 MED ORDER — HYDROXYZINE HCL 10 MG PO TABS: 10.0000 mg | ORAL_TABLET | Freq: Three times a day (TID) | ORAL | 0 refills | Status: DC | PRN

## 2019-11-15 MED ORDER — BUSPIRONE HCL 5 MG PO TABS: 5.0000 mg | ORAL_TABLET | Freq: Two times a day (BID) | ORAL | 1 refills | Status: DC

## 2019-11-15 NOTE — Telephone Encounter (Signed)
[name] has requested her Hydromorphone dosage to be changed to on full pill daily. Half a pill will not help her pain. Patient also stated she will need a new Rx sent to the pharmacy.   Please advise.

## 2019-11-15 NOTE — Telephone Encounter (Signed)
I have sent this for her; thank you!

## 2019-11-15 NOTE — Progress Notes (Signed)
SUBJECTIVE:   CHIEF COMPLAINT / HPI:   Anxiety  Denies SI Patient reports significant amount of anxiety, that worsened after being diagnosed with cancer She is previously received Xanax, but has also recently been changed to Dilaudid for chronic pain control She is following with pain management At last visit, had discussed with patient that would not continue to fill Xanax and Dilaudid at the same time She is on Cymbalta for pain as well as anxiety, but states that her anxiety is not well controlled   Pulmnology Referral Was advised by Doctors in [place] o2 levels dropepd to 80 Levels drop at night as well Doesn't have a CPAP Hx COPD States that she was to be referred to pulmonology after her surgery in [place], but patient returned back to the area and was advised to ask her PCP for referral to pulmonology  Hypomagnesemia Patient with magnesium of 1.2 on 7/15, was treated with over-the-counter Mag-Ox and advised to come back in 1 week for repeat labs  It was checked in [place] and they gave her IV  Diabetes Current regimen: Farxiga 10 mg daily, NovoLog 30 units 3 times daily, Lantus 45u QHS CBGs 318 prior to surgery, has been running "high" On statin, had angioedema with ACE Was referred to endocrinology at patient's request at last visit in July, but referral was sent to Dr. Loanne Drilling and patient does not want to see him Last A1c 8.7 on 5/16 A1c today 9.3  Malignant carcinoid tumor of the duodenum Patient follows with gastroenterology in medical oncology She also sought a second opinion from a physician in [place] and had what appears to be an EGD with biopsy in August She has previously been seen by [facility] here in [place], but does not want to go back to him She is asking for a second opinion She also has significant diarrhea likely associated with her carcinoid tumor and has recurrent hypomagnesemia She advised that a physician [place] had offered  monthly injections for her diarrhea She is requesting referral for a second opinion here in [place] and possible consideration of this injection  PERTINENT  PMH / PSH: HTN, CHF, history of cardiac arrest, COPD, restrictive lung disease secondary to obesity, malignant carcinoid tumor of duodenum  OBJECTIVE:   BP (!) 142/86   Pulse (!) 101   Wt (!) 355 lb 6.4 oz (161.2 kg)   LMP 10/10/2012   SpO2 98%   BMI 57.36 kg/m    Physical Exam:  General: 54 y.o. female in NAD Cardio: RRR no m/r/g Lungs: CTAB, no wheezing, no rhonchi, no crackles, no IWOB on RA Skin: warm and dry Extremities: No edema Psych: somewhat anxious with pressured speech, angry at beginning of encounter, but mood and affect appropriate at wnd of visit, no SI    ASSESSMENT/PLAN:   Anxiety Uncontrolled.  Denies SI.  Discussed with patient at length that long-term use of benzodiazepines for anxiety are not beneficial.  Also discussed with patient that given her use of Dilaudid from pain management, would not feel comfortable prescribing both of these medications to her as the risk for decreased respiratory drive and ultimately death could be high.  Ultimately, patient agreed for alternative treatment of anxiety.  We will start her on BuSpar 5 mg twice daily, continue Cymbalta, and add hydroxyzine to use as needed.  Also advised patient that given her uncontrolled symptoms and use of prescribed narcotics, she would be better suited to see psychiatry, which it appears her previous PCP had  also recommended.  Patient was agreeable to a referral at this time.  Referral was placed and she was given a psychiatrists to call.  We will have her follow-up in 1 month.  Hypoxia Patient is requesting referral to pulmonology given hypoxia during surgery and COPD as well as nocturnal hypoxia.  Referral placed at patient request.  Hypomagnesemia Likely secondary to chronic diarrhea in the setting of carcinoid tumor.  This is complicated  by the fact that patient has diabetic gastroparesis as well.  She has been treated orally, but this could also make diarrhea worse.  Overall, the best treatment for hypomagnesemia will be treatment of her diarrhea.  See below.  We will check a magnesium level today and adjust as needed.  Uncontrolled type 2 diabetes mellitus with diabetic neuropathy, with long-term current use of insulin (HCC) Uncontrolled.  A1c 9.3 today, up from 8.7 in May.  Discussed with patient, will continue with Farxiga and NovoLog 30 units 3 times daily.  Her levels are elevated in the mornings, she takes Lantus at night, therefore will increase Lantus to 50 units nightly.  Follow-up in 1 month.  Continue to check CBGs.  She is on a statin, has been intolerant of ACE in the past secondary to angioedema.  Malignant carcinoid tumor of duodenum ([place]) Follows with oncology and gastroenterology.  Has been seeing physicians in [place] as well as in [place].  She is requesting a second opinion here in [place], gastroenterologist and would like also to be considered for monthly injections for diarrhea, suspect somatostatin.  Referral placed at patient request.     Cleophas Dunker, [place]

## 2019-11-15 NOTE — Telephone Encounter (Signed)
Will discuss with patient at her appointment today.

## 2019-11-15 NOTE — Patient Instructions (Addendum)
Thank you for coming to see me today. It was a pleasure. Today we talked about:   We will start you on Buspar for your anxiety - take this twice a day.  You can use hydroxyzine up to 3 times a day as needed for anxiety.    We will get some labs today.  If they are abnormal or we need to do something about them, I will call you.  If they are normal, I will send you a message on MyChart (if it is active) or a letter in the mail.  If you don't hear from [place] in 2 weeks, please call the office at the number below.  I have placed a referral to Gastroenterology, Pulmonology, and Endocrinology.  If you do not hear from them in the next 2 weeks, please give [place] a call.  A referral has been placed to psychiatry, please call at the number below to schedule an appointment.  Confirm that they take your insurance.  Please follow-up with me in 1-2 months or sooner as needed.  If you have any questions or concerns, please do not hesitate to call the office at [phone].  Best,   [place] Constable, DO   Psychiatry Resource List (Adults and Children) Most of these providers will take Medicaid. please consult your insurance for a complete and updated list of available providers. When calling to make an appointment have your insurance information available to confirm you are covered.   [place], [place]:   [facility]: [address]     [phone]   Linna Hoff: [place]. [place],        [phone] [place]: [place],    [place]: [place]med [place],                   [place] [facility]: [place]         [place]  (Psychiatry only; Adults /children 12 and over, will take Medicaid)  [place],  [place], [place] [address]       [phone]   [place] (Psychiatry & counseling ; adults & children ; will take Medicaid [address]  [address]  [place] [address]   Go on-line to complete referral ( [email or web address] [phone]    (Spanish therapist)  [facility]  Psychiatry & counseling; Adults and children;  Call Registration prior to scheduling an appointment [phone] [address]    [place], [place] [address]    [phone]  [facility] (Psychiatry & counseling; adults & children; Medicare no Medicaid)  [place], [place]  [address]      [phone]    [facility] (up to age 34)  Psychiatry & counseling ,will take Medicaid, must do counseling to receive psychiatry services  [address] [place] [address]        ([place] (Psychiatry & counseling; adults & children; will take Medicaid) Will need a referral from provider [address],  [place], [place]  [phone]   [facility] --- Walk-In Mon-Friday 8am-3pm ( will take Medicaid, Psychiatry, Adults & children,  [address], [place], [place]   [phone]  [facility]--, Walk-in M-F 8am-12pm and 1pm -3pm   (Counseling, Psychiatry, will take Medicaid, adults & children)  [address] [place], [place]  [phone]

## 2019-11-15 NOTE — Telephone Encounter (Signed)
Patient calls nurse line requesting a xanax refill. I advised patient she has a 3:30pm apt today with PCP and per last note this would not be refilled. I advised patient to keep this apt so they could discuss. Patient then demanded for PCP to call her. I advised her I would pass the message along, however I strongly encouraged keeping apt.

## 2019-11-16 LAB — MAGNESIUM: Magnesium: 1.1 mg/dL — ABNORMAL LOW (ref 1.6–2.3)

## 2019-11-16 NOTE — Assessment & Plan Note (Signed)
Uncontrolled.  Denies SI.  Discussed with patient at length that long-term use of benzodiazepines for anxiety are not beneficial.  Also discussed with patient that given her use of Dilaudid from pain management, would not feel comfortable prescribing both of these medications to her as the risk for decreased respiratory drive and ultimately death could be high.  Ultimately, patient agreed for alternative treatment of anxiety.  We will start her on BuSpar 5 mg twice daily, continue Cymbalta, and add hydroxyzine to use as needed.  Also advised patient that given her uncontrolled symptoms and use of prescribed narcotics, she would be better suited to see psychiatry, which it appears her previous PCP had also recommended.  Patient was agreeable to a referral at this time.  Referral was placed and she was given a psychiatrists to call.  We will have her follow-up in 1 month.

## 2019-11-16 NOTE — Assessment & Plan Note (Signed)
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  This is complicated by the fact that patient has diabetic gastroparesis as well.  She has been treated orally, but this could also make diarrhea worse.  Overall, the best treatment for hypomagnesemia will be treatment of her diarrhea.  See below.  We will check a magnesium level today and adjust as needed.

## 2019-11-16 NOTE — Assessment & Plan Note (Signed)
Patient is requesting referral to pulmonology given hypoxia during surgery and COPD as well as nocturnal hypoxia.  Referral placed at patient request.

## 2019-11-16 NOTE — Assessment & Plan Note (Signed)
Uncontrolled.  A1c 9.3 today, up from 8.7 in May.  Discussed with patient, will continue with Farxiga and NovoLog 30 units 3 times daily.  Her levels are elevated in the mornings, she takes Lantus at night, therefore will increase Lantus to 50 units nightly.  Follow-up in 1 month.  Continue to check CBGs.  She is on a statin, has been intolerant of ACE in the past secondary to angioedema.

## 2019-11-16 NOTE — Assessment & Plan Note (Signed)
Follows with oncology and gastroenterology.  Has been seeing physicians in [place] as well as in [place].  She is requesting a second opinion here in [place], gastroenterologist and would like also to be considered for monthly injections for diarrhea, suspect somatostatin.  Referral placed at patient request.

## 2019-11-19 ENCOUNTER — Telehealth: Payer: Self-pay | Admitting: *Deleted

## 2019-11-19 ENCOUNTER — Telehealth: Payer: Self-pay | Admitting: Family Medicine

## 2019-11-19 ENCOUNTER — Other Ambulatory Visit: Payer: Self-pay | Admitting: Family Medicine

## 2019-11-19 MED ORDER — MAGNESIUM CHLORIDE 64 MG PO TABS: 1.0000 | ORAL_TABLET | Freq: Every day | ORAL | 0 refills | Status: DC

## 2019-11-19 NOTE — Chronic Care Management (AMB) (Signed)
  Care Management   Note  11/19/2019 Name: Deborah [name] MRN: [id number] DOB: September 08, 1965  Deborah [name] is a 54 y.o. year old female who is a primary care patient of [name], Bernita Raisin, DO and is actively engaged with the care management team. I reached out to Deborah [name] by phone today to assist with re-scheduling a follow up visit with the RN Case Manager.  Follow up plan: Unsuccessful telephone outreach attempt made. A HIPPA compliant phone message was left for the patient providing contact information and requesting a return call. The care management team will reach out to the patient again over the next 7 days. If patient returns call to provider office, please advise to call [place] at [place] [facility]

## 2019-11-19 NOTE — Progress Notes (Signed)
See result note.  

## 2019-11-19 NOTE — Telephone Encounter (Signed)
Called patient, no answer, left VM. (see result note)  Called to discuss low magnesium.  If she calls back, she is continuing to have low values because she continues to have diarrhea.  Ultimately, treatment will need to be decreasing the diarrhea, which we are trying to do by sending her to GI for possible injections.  Until then, have sent a prescription to the pharmacy for magnesium chloride as it takes longer to absorb and is less likely to make diarrhea worse.  It is over the counter, so it might be cheaper for her to buy herself rather than getting the prescription.  She should try to take this once daily, but it diarrhea gets worse, she can decrease to every other day.

## 2019-11-20 ENCOUNTER — Other Ambulatory Visit: Payer: Self-pay | Admitting: Family Medicine

## 2019-11-21 NOTE — Progress Notes (Signed)
Contacted patient to provide instructions prior to her arrival to [facility] for her scheduled 11-27-19 Esophagogastrodudoenoscopy procedure with Dr. Alessandra Bevels.   Per verbalized that she plans "to cancel the procedure". Pt advised to contact Dr. Leanna Sato office, and advise them of her decision. Pt verbalized understanding.

## 2019-11-22 ENCOUNTER — Ambulatory Visit: Payer: Medicare Other | Admitting: Licensed Clinical Social Worker

## 2019-11-22 DIAGNOSIS — Z741 Need for assistance with personal care: Secondary | ICD-10-CM

## 2019-11-22 NOTE — Chronic Care Management (AMB) (Signed)
  Care Management   Note  11/22/2019 Name: [name] MRN: [id number] DOB: Feb 19, 1966  Deborah [name] is a 54 y.o. year old female who is a primary care patient of [name], Bernita Raisin, DO and is actively engaged with the care management team. I reached out to Deborah [name] by phone today to assist with re-scheduling a follow up visit with the RN Case Manager and  Licensed Clinical Social Worker  Follow up plan: Telephone appointment with care management team member scheduled for: 11/22/2019 with LCSW and 12/05/2019 with RN CM   [facility]

## 2019-11-22 NOTE — Chronic Care Management (AMB) (Signed)
Care Management   Clinical Social Work Follow Up   11/22/2019 Name: Deborah [name] MRN: [id number] DOB: 05/21/1965 Referred by: Cleophas Dunker, DO  Reason for referral : Care Coordination (PCS)  Deborah [name] is a 54 y.o. year old female who is a primary care patient of Cleophas Dunker, DO.  Reason for follow-up: Patient having difficulty with her PCS provider.   Assessment: LCSW called patient to assess needs, barriers and concerns.  Patient states unable to talk and will call LCSW back Plan: LCSW will wait for return call.   Advance Directive Status: ; not addressed during this encounter.  SDOH (Social Determinants of Health) assessments performed: No ;    Goals Addressed            This Visit's Progress   . personal care services       CARE PLAN ENTRY (see longitudinal plan of care for additional care plan information)  Current Barriers & Progress:   . Patient with chronic medical conditions needs Support, Education, and Care Coordination to resolve unmet Personal Care needs . Patient contacted personal care agency in [facility] for start date . Patient reports PCS services started Monday July 12th  . Patient reports difficulty with PCS worker, states agency rep. Scheduled to come to her home today Clinical Social Work Goal(s):  [name] Over the next 30 to 45 days, patient will have personal care needs met as evident by having PCS Aide in the home assisting with needs.  Interventions provided by LCSW : . Provided phone number to [facility] for patient to call Patient Self Care Activities & Deficits:  . Patient is unable to perform ADLs independently without assistance  . Family/support system will continue to assist patient with meeting needs in addition to PCS  . Call [facility] with questions [phone] or [phone]  Please see past updates related to this goal by clicking on the "Past Updates" button in the selected goal         Outpatient Encounter Medications as of 11/22/2019  Medication Sig  . albuterol (PROVENTIL) (2.5 MG/3ML) 0.083% nebulizer solution Take 3 mLs (2.5 mg total) by nebulization every 6 (six) hours as needed for wheezing or shortness of breath.  . allopurinol (ZYLOPRIM) 100 MG tablet Take by mouth.  . ALPRAZolam (XANAX) 1 MG tablet Take 1 tablet (1 mg total) by mouth 2 (two) times daily as needed for anxiety.  . ASPIRIN LOW DOSE 81 MG EC tablet TAKE ONE TABLET BY MOUTH ONCE DAILY (AM)  . atorvastatin (LIPITOR) 10 MG tablet TAKE ONE TABLET BY MOUTH ONCE DAILY (AM)  . blood glucose meter kit and supplies KIT Dispense based on patient and insurance preference. Use up to four times daily as directed. (FOR ICD-9 250.00, 250.01).  . busPIRone (BUSPAR) 5 MG tablet Take 1 tablet (5 mg total) by mouth 2 (two) times daily.  . cetirizine (ZYRTEC) 10 MG tablet TAKE ONE TABLET BY MOUTH ONCE DAILY (AM)  . DULoxetine (CYMBALTA) 30 MG capsule Take by mouth.  . DULoxetine HCl 40 MG CPEP TAKE ONE CAPSULE BY MOUTH TWICE DAILY  . EASY COMFORT PEN NEEDLES 31G X 5 MM MISC USE 3 TIMES A DAY FOR INSULIN ADMINISTRATION  . ELIQUIS 5 MG TABS tablet TAKE 1 TABLET BY MOUTH 2 (TWO) TIMES DAILY.  [name] FARXIGA 10 MG TABS tablet Take 10 mg by mouth daily.  . ferrous sulfate 325 (65 FE) MG tablet Take 1 tablet (325 mg total)  by mouth daily with breakfast.  . fluticasone (FLONASE) 50 MCG/ACT nasal spray Place 2 sprays into both nostrils daily as needed for allergies or rhinitis.  . hydrALAZINE (APRESOLINE) 25 MG tablet TAKE 1 TABLET BY MOUTH 3 (THREE) TIMES DAILY. FOR BLOOD PRESSURE  . HYDROmorphone (DILAUDID) 2 MG tablet Take 1 tablet (2 mg total) by mouth daily as needed for severe pain.  [name] HYDROmorphone (DILAUDID) 2 MG tablet Take 0.5 tablets (1 mg total) by mouth daily as needed for severe pain.  . hydrOXYzine (ATARAX/VISTARIL) 10 MG tablet Take 1 tablet (10 mg total) by mouth 3 (three) times daily as needed.  . insulin aspart  (NOVOLOG FLEXPEN) 100 UNIT/ML FlexPen Inject 15 Units into the skin 3 (three) times daily with meals. (Patient taking differently: Inject 30 Units into the skin 3 (three) times daily with meals. )  . insulin aspart protamine- aspart (NOVOLOG MIX 70/30) (70-30) 100 UNIT/ML injection Inject into the skin.  [name] isosorbide mononitrate (IMDUR) 60 MG 24 hr tablet TAKE 1 TABLET (60 MG TOTAL) BY MOUTH DAILY.  [name] LANTUS SOLOSTAR 100 UNIT/ML Solostar Pen Inject 50 Units into the skin at bedtime.  [name] loperamide (IMODIUM) 2 MG capsule Take 2 mg by mouth 4 (four) times daily as needed for diarrhea or loose stools.  . Magnesium Chloride 64 MG TABS Take 1 tablet by mouth daily.  . melatonin 3 MG TABS tablet TAKE 2 TABLETS BY MOUTH AT BEDTIME.  . methocarbamol (ROBAXIN) 500 MG tablet Take by mouth.  . metoprolol succinate (TOPROL-XL) 25 MG 24 hr tablet TAKE ONE TABLET BY MOUTH ONCE DAILY  . nitroGLYCERIN (NITROSTAT) 0.4 MG SL tablet Place 1 tablet (0.4 mg total) under the tongue every 5 (five) minutes as needed for chest pain.  [name] ondansetron (ZOFRAN-ODT) 4 MG disintegrating tablet Take 1 tablet (4 mg total) by mouth every 8 (eight) hours as needed for nausea or vomiting.  . pantoprazole (PROTONIX) 40 MG tablet TAKE ONE TABLET BY MOUTH TWICE DAILY  . pregabalin (LYRICA) 25 MG capsule Take 1 capsule (25 mg total) by mouth at bedtime.  . sucralfate (CARAFATE) 1 g tablet Take by mouth.  . torsemide (DEMADEX) 20 MG tablet TAKE 2 TABLETS (40 MG TOTAL) BY MOUTH DAILY.   No facility-administered encounter medications on file as of 11/22/2019.   Review of patient status, including review of consultants reports, relevant laboratory and other test results, and collaboration with appropriate care team members and the patient's provider was performed as part of comprehensive patient evaluation and provision of care management services.    [name], [place] / [place]   [phone] 3:27 PM

## 2019-11-23 ENCOUNTER — Other Ambulatory Visit (HOSPITAL_COMMUNITY): Payer: Medicare Other

## 2019-11-25 ENCOUNTER — Other Ambulatory Visit: Payer: Self-pay

## 2019-11-25 ENCOUNTER — Encounter (HOSPITAL_COMMUNITY): Payer: Self-pay

## 2019-11-25 DIAGNOSIS — N179 Acute kidney failure, unspecified: Principal | ICD-10-CM | POA: Diagnosis present

## 2019-11-25 DIAGNOSIS — K3184 Gastroparesis: Secondary | ICD-10-CM | POA: Diagnosis present

## 2019-11-25 DIAGNOSIS — K219 Gastro-esophageal reflux disease without esophagitis: Secondary | ICD-10-CM | POA: Diagnosis present

## 2019-11-25 DIAGNOSIS — D3A8 Other benign neuroendocrine tumors: Secondary | ICD-10-CM | POA: Diagnosis present

## 2019-11-25 DIAGNOSIS — E860 Dehydration: Secondary | ICD-10-CM | POA: Diagnosis present

## 2019-11-25 DIAGNOSIS — R0602 Shortness of breath: Secondary | ICD-10-CM | POA: Diagnosis not present

## 2019-11-25 DIAGNOSIS — T50905A Adverse effect of unspecified drugs, medicaments and biological substances, initial encounter: Secondary | ICD-10-CM | POA: Diagnosis present

## 2019-11-25 DIAGNOSIS — Z91041 Radiographic dye allergy status: Secondary | ICD-10-CM

## 2019-11-25 DIAGNOSIS — Z6841 Body Mass Index (BMI) 40.0 and over, adult: Secondary | ICD-10-CM

## 2019-11-25 DIAGNOSIS — D631 Anemia in chronic kidney disease: Secondary | ICD-10-CM | POA: Diagnosis present

## 2019-11-25 DIAGNOSIS — Z833 Family history of diabetes mellitus: Secondary | ICD-10-CM

## 2019-11-25 DIAGNOSIS — Z885 Allergy status to narcotic agent status: Secondary | ICD-10-CM

## 2019-11-25 DIAGNOSIS — E1143 Type 2 diabetes mellitus with diabetic autonomic (poly)neuropathy: Secondary | ICD-10-CM | POA: Diagnosis present

## 2019-11-25 DIAGNOSIS — Z880 Allergy status to penicillin: Secondary | ICD-10-CM

## 2019-11-25 DIAGNOSIS — I130 Hypertensive heart and chronic kidney disease with heart failure and stage 1 through stage 4 chronic kidney disease, or unspecified chronic kidney disease: Secondary | ICD-10-CM | POA: Diagnosis present

## 2019-11-25 DIAGNOSIS — G92 Toxic encephalopathy: Secondary | ICD-10-CM | POA: Diagnosis present

## 2019-11-25 DIAGNOSIS — Z794 Long term (current) use of insulin: Secondary | ICD-10-CM

## 2019-11-25 DIAGNOSIS — Z8249 Family history of ischemic heart disease and other diseases of the circulatory system: Secondary | ICD-10-CM

## 2019-11-25 DIAGNOSIS — N1832 Chronic kidney disease, stage 3b: Secondary | ICD-10-CM | POA: Diagnosis present

## 2019-11-25 DIAGNOSIS — Z20822 Contact with and (suspected) exposure to covid-19: Secondary | ICD-10-CM | POA: Diagnosis present

## 2019-11-25 DIAGNOSIS — Z7901 Long term (current) use of anticoagulants: Secondary | ICD-10-CM

## 2019-11-25 DIAGNOSIS — E1122 Type 2 diabetes mellitus with diabetic chronic kidney disease: Secondary | ICD-10-CM | POA: Diagnosis present

## 2019-11-25 DIAGNOSIS — E785 Hyperlipidemia, unspecified: Secondary | ICD-10-CM | POA: Diagnosis present

## 2019-11-25 DIAGNOSIS — Z886 Allergy status to analgesic agent status: Secondary | ICD-10-CM

## 2019-11-25 DIAGNOSIS — Z993 Dependence on wheelchair: Secondary | ICD-10-CM

## 2019-11-25 DIAGNOSIS — I5042 Chronic combined systolic (congestive) and diastolic (congestive) heart failure: Secondary | ICD-10-CM | POA: Diagnosis present

## 2019-11-25 DIAGNOSIS — I69351 Hemiplegia and hemiparesis following cerebral infarction affecting right dominant side: Secondary | ICD-10-CM

## 2019-11-25 DIAGNOSIS — R6510 Systemic inflammatory response syndrome (SIRS) of non-infectious origin without acute organ dysfunction: Secondary | ICD-10-CM | POA: Diagnosis present

## 2019-11-25 DIAGNOSIS — Z79899 Other long term (current) drug therapy: Secondary | ICD-10-CM

## 2019-11-25 DIAGNOSIS — Z888 Allergy status to other drugs, medicaments and biological substances status: Secondary | ICD-10-CM

## 2019-11-25 DIAGNOSIS — G8929 Other chronic pain: Secondary | ICD-10-CM | POA: Diagnosis present

## 2019-11-25 DIAGNOSIS — M797 Fibromyalgia: Secondary | ICD-10-CM | POA: Diagnosis present

## 2019-11-25 DIAGNOSIS — M109 Gout, unspecified: Secondary | ICD-10-CM | POA: Diagnosis present

## 2019-11-25 DIAGNOSIS — I428 Other cardiomyopathies: Secondary | ICD-10-CM | POA: Diagnosis present

## 2019-11-25 DIAGNOSIS — I480 Paroxysmal atrial fibrillation: Secondary | ICD-10-CM | POA: Diagnosis present

## 2019-11-25 DIAGNOSIS — E876 Hypokalemia: Secondary | ICD-10-CM | POA: Diagnosis present

## 2019-11-25 LAB — COMPREHENSIVE METABOLIC PANEL
ALT: 20 U/L (ref 0–44)
AST: 22 U/L (ref 15–41)
Albumin: 3.9 g/dL (ref 3.5–5.0)
Alkaline Phosphatase: 115 U/L (ref 38–126)
Anion gap: 14 (ref 5–15)
BUN: 22 mg/dL — ABNORMAL HIGH (ref 6–20)
CO2: 25 mmol/L (ref 22–32)
Calcium: 9.6 mg/dL (ref 8.9–10.3)
Chloride: 102 mmol/L (ref 98–111)
Creatinine, Ser: 1.42 mg/dL — ABNORMAL HIGH (ref 0.44–1.00)
GFR calc Af Amer: 48 mL/min — ABNORMAL LOW (ref >60)
GFR calc non Af Amer: 41 mL/min — ABNORMAL LOW (ref >60)
Glucose, Bld: 170 mg/dL — ABNORMAL HIGH (ref 70–99)
Potassium: 3 mmol/L — ABNORMAL LOW (ref 3.5–5.1)
Sodium: 141 mmol/L (ref 135–145)
Total Bilirubin: 1.1 mg/dL (ref 0.3–1.2)
Total Protein: 8.7 g/dL — ABNORMAL HIGH (ref 6.5–8.1)

## 2019-11-25 LAB — CBC
HCT: 33.1 % — ABNORMAL LOW (ref 36.0–46.0)
Hemoglobin: 10.5 g/dL — ABNORMAL LOW (ref 12.0–15.0)
MCH: 28.5 pg (ref 26.0–34.0)
MCHC: 31.7 g/dL (ref 30.0–36.0)
MCV: 89.9 fL (ref 80.0–100.0)
Platelets: 447 10*3/uL — ABNORMAL HIGH (ref 150–400)
RBC: 3.68 MIL/uL — ABNORMAL LOW (ref 3.87–5.11)
RDW: 14.9 % (ref 11.5–15.5)
WBC: 14.3 10*3/uL — ABNORMAL HIGH (ref 4.0–10.5)
nRBC: 0 % (ref 0.0–0.2)

## 2019-11-25 LAB — I-STAT BETA HCG BLOOD, ED (MC, WL, AP ONLY): I-stat hCG, quantitative: <5 m[IU]/mL (ref <5)

## 2019-11-25 LAB — TROPONIN I (HIGH SENSITIVITY): Troponin I (High Sensitivity): 21 ng/L — ABNORMAL HIGH (ref <18)

## 2019-11-25 LAB — LIPASE, BLOOD: Lipase: 22 U/L (ref 11–51)

## 2019-11-25 NOTE — ED Triage Notes (Signed)
Patient arrived via [facility] with complaints of right sided chest pain, abdominal pain, and emesis that started today.

## 2019-11-26 ENCOUNTER — Emergency Department (HOSPITAL_COMMUNITY): Payer: Medicare Other

## 2019-11-26 ENCOUNTER — Inpatient Hospital Stay (HOSPITAL_COMMUNITY)
Admission: EM | Admit: 2019-11-26 | Discharge: 2019-11-30 | DRG: 682 | Disposition: A | Discharge status: Home / Self Care | Payer: Medicare Other | Attending: Family Medicine | Admitting: Family Medicine

## 2019-11-26 ENCOUNTER — Encounter (HOSPITAL_COMMUNITY): Payer: Self-pay | Admitting: Emergency Medicine

## 2019-11-26 DIAGNOSIS — R079 Chest pain, unspecified: Secondary | ICD-10-CM

## 2019-11-26 DIAGNOSIS — E1140 Type 2 diabetes mellitus with diabetic neuropathy, unspecified: Secondary | ICD-10-CM

## 2019-11-26 DIAGNOSIS — I5022 Chronic systolic (congestive) heart failure: Secondary | ICD-10-CM | POA: Diagnosis not present

## 2019-11-26 DIAGNOSIS — R109 Unspecified abdominal pain: Secondary | ICD-10-CM | POA: Diagnosis present

## 2019-11-26 DIAGNOSIS — E1143 Type 2 diabetes mellitus with diabetic autonomic (poly)neuropathy: Secondary | ICD-10-CM | POA: Diagnosis not present

## 2019-11-26 DIAGNOSIS — R0789 Other chest pain: Secondary | ICD-10-CM | POA: Diagnosis not present

## 2019-11-26 DIAGNOSIS — E1165 Type 2 diabetes mellitus with hyperglycemia: Secondary | ICD-10-CM

## 2019-11-26 DIAGNOSIS — IMO0002 Reserved for concepts with insufficient information to code with codable children: Secondary | ICD-10-CM

## 2019-11-26 DIAGNOSIS — E876 Hypokalemia: Secondary | ICD-10-CM

## 2019-11-26 DIAGNOSIS — R1030 Lower abdominal pain, unspecified: Secondary | ICD-10-CM

## 2019-11-26 DIAGNOSIS — R0602 Shortness of breath: Secondary | ICD-10-CM

## 2019-11-26 DIAGNOSIS — Z794 Long term (current) use of insulin: Secondary | ICD-10-CM

## 2019-11-26 DIAGNOSIS — K3184 Gastroparesis: Secondary | ICD-10-CM

## 2019-11-26 DIAGNOSIS — N179 Acute kidney failure, unspecified: Secondary | ICD-10-CM | POA: Diagnosis present

## 2019-11-26 HISTORY — DX: Benign carcinoid tumor of the duodenum: D3A.010

## 2019-11-26 HISTORY — DX: Malignant (primary) neoplasm, unspecified: C80.1

## 2019-11-26 LAB — MAGNESIUM: Magnesium: 1.3 mg/dL — ABNORMAL LOW (ref 1.7–2.4)

## 2019-11-26 LAB — GLUCOSE, CAPILLARY: Glucose-Capillary: 422 mg/dL — ABNORMAL HIGH (ref 70–99)

## 2019-11-26 LAB — URINALYSIS, ROUTINE W REFLEX MICROSCOPIC
Bilirubin Urine: NEGATIVE
Glucose, UA: >=500 mg/dL — AB
Ketones, ur: 5 mg/dL — AB
Leukocytes,Ua: NEGATIVE
Nitrite: NEGATIVE
Protein, ur: >=300 mg/dL — AB
Specific Gravity, Urine: 1.013 (ref 1.005–1.030)
pH: 5 (ref 5.0–8.0)

## 2019-11-26 LAB — TROPONIN I (HIGH SENSITIVITY): Troponin I (High Sensitivity): 31 ng/L — ABNORMAL HIGH (ref <18)

## 2019-11-26 LAB — D-DIMER, QUANTITATIVE: D-Dimer, Quant: 0.7 ug/mL-FEU — ABNORMAL HIGH (ref 0.00–0.50)

## 2019-11-26 LAB — SARS CORONAVIRUS 2 BY RT PCR (HOSPITAL ORDER, PERFORMED IN ~~LOC~~ HOSPITAL LAB): SARS Coronavirus 2: NEGATIVE

## 2019-11-26 MED ORDER — SODIUM CHLORIDE 0.9% FLUSH
3.0000 mL | Freq: Two times a day (BID) | INTRAVENOUS | Status: DC
  Administered 2019-11-26 – 2019-11-29 (×4): 3 mL via INTRAVENOUS

## 2019-11-26 MED ORDER — LACTATED RINGERS IV SOLN: INTRAVENOUS | Status: AC

## 2019-11-26 MED ORDER — HYDROMORPHONE HCL 2 MG PO TABS
2.0000 mg | ORAL_TABLET | Freq: Every day | ORAL | Status: DC | PRN
  Administered 2019-11-26: 2 mg via ORAL
  Filled 2019-11-26: qty 1

## 2019-11-26 MED ORDER — HYDROMORPHONE HCL 1 MG/ML IJ SOLN
1.0000 mg | INTRAMUSCULAR | Status: DC | PRN
  Administered 2019-11-26 – 2019-11-29 (×12): 1 mg via INTRAVENOUS
  Filled 2019-11-26 (×12): qty 1

## 2019-11-26 MED ORDER — ATORVASTATIN CALCIUM 10 MG PO TABS
10.0000 mg | ORAL_TABLET | Freq: Every day | ORAL | Status: DC
  Administered 2019-11-26 – 2019-11-30 (×5): 10 mg via ORAL
  Filled 2019-11-26 (×5): qty 1

## 2019-11-26 MED ORDER — MAGNESIUM SULFATE 4 GM/100ML IV SOLN
4.0000 g | Freq: Once | INTRAVENOUS | Status: AC
  Administered 2019-11-26: 4 g via INTRAVENOUS
  Filled 2019-11-26: qty 100

## 2019-11-26 MED ORDER — INSULIN GLARGINE 100 UNIT/ML ~~LOC~~ SOLN
40.0000 [IU] | Freq: Every day | SUBCUTANEOUS | Status: DC
  Administered 2019-11-27 – 2019-11-29 (×4): 40 [IU] via SUBCUTANEOUS
  Filled 2019-11-26 (×6): qty 0.4

## 2019-11-26 MED ORDER — ISOSORBIDE MONONITRATE ER 60 MG PO TB24
60.0000 mg | ORAL_TABLET | Freq: Every day | ORAL | Status: DC
  Administered 2019-11-26 – 2019-11-30 (×5): 60 mg via ORAL
  Filled 2019-11-26 (×5): qty 1

## 2019-11-26 MED ORDER — METOPROLOL SUCCINATE ER 25 MG PO TB24
25.0000 mg | ORAL_TABLET | Freq: Every day | ORAL | Status: DC
  Administered 2019-11-26 – 2019-11-30 (×5): 25 mg via ORAL
  Filled 2019-11-26 (×5): qty 1

## 2019-11-26 MED ORDER — MAGNESIUM SULFATE 2 GM/50ML IV SOLN
2.0000 g | Freq: Once | INTRAVENOUS | Status: AC
  Administered 2019-11-26: 2 g via INTRAVENOUS
  Filled 2019-11-26: qty 50

## 2019-11-26 MED ORDER — TORSEMIDE 20 MG PO TABS
40.0000 mg | ORAL_TABLET | Freq: Every day | ORAL | Status: DC
  Administered 2019-11-26: 40 mg via ORAL
  Filled 2019-11-26 (×2): qty 2

## 2019-11-26 MED ORDER — HYDRALAZINE HCL 25 MG PO TABS
25.0000 mg | ORAL_TABLET | Freq: Three times a day (TID) | ORAL | Status: DC
  Administered 2019-11-26 – 2019-11-30 (×11): 25 mg via ORAL
  Filled 2019-11-26 (×12): qty 1

## 2019-11-26 MED ORDER — SODIUM CHLORIDE 0.9 % IV BOLUS
1000.0000 mL | Freq: Once | INTRAVENOUS | Status: AC
  Administered 2019-11-26: 1000 mL via INTRAVENOUS

## 2019-11-26 MED ORDER — FENTANYL CITRATE (PF) 100 MCG/2ML IJ SOLN
100.0000 ug | Freq: Once | INTRAMUSCULAR | Status: AC
  Administered 2019-11-26: 100 ug via INTRAVENOUS
  Filled 2019-11-26: qty 2

## 2019-11-26 MED ORDER — DICYCLOMINE HCL 10 MG PO CAPS
10.0000 mg | ORAL_CAPSULE | Freq: Three times a day (TID) | ORAL | Status: DC
  Filled 2019-11-26 (×2): qty 1

## 2019-11-26 MED ORDER — PREGABALIN 25 MG PO CAPS
25.0000 mg | ORAL_CAPSULE | Freq: Every day | ORAL | Status: DC
  Administered 2019-11-26 – 2019-11-29 (×4): 25 mg via ORAL
  Filled 2019-11-26 (×4): qty 1

## 2019-11-26 MED ORDER — INSULIN ASPART 100 UNIT/ML ~~LOC~~ SOLN
10.0000 [IU] | Freq: Three times a day (TID) | SUBCUTANEOUS | Status: DC
  Filled 2019-11-26: qty 0.1

## 2019-11-26 MED ORDER — FAMOTIDINE IN NACL 20-0.9 MG/50ML-% IV SOLN
20.0000 mg | Freq: Once | INTRAVENOUS | Status: AC
  Administered 2019-11-26: 20 mg via INTRAVENOUS
  Filled 2019-11-26: qty 50

## 2019-11-26 MED ORDER — DULOXETINE HCL 20 MG PO CPEP
40.0000 mg | ORAL_CAPSULE | Freq: Two times a day (BID) | ORAL | Status: DC
  Administered 2019-11-27 – 2019-11-29 (×6): 40 mg via ORAL
  Filled 2019-11-26 (×7): qty 2

## 2019-11-26 MED ORDER — ONDANSETRON HCL 4 MG/2ML IJ SOLN
4.0000 mg | Freq: Once | INTRAMUSCULAR | Status: AC
  Administered 2019-11-26: 4 mg via INTRAVENOUS
  Filled 2019-11-26: qty 2

## 2019-11-26 MED ORDER — DULOXETINE HCL 40 MG PO CPEP: 1.0000 | ORAL_CAPSULE | Freq: Two times a day (BID) | ORAL | Status: DC

## 2019-11-26 MED ORDER — POTASSIUM CHLORIDE CRYS ER 20 MEQ PO TBCR
40.0000 meq | EXTENDED_RELEASE_TABLET | Freq: Once | ORAL | Status: AC
  Administered 2019-11-27: 40 meq via ORAL

## 2019-11-26 MED ORDER — TECHNETIUM TO 99M ALBUMIN AGGREGATED
4.3700 | Freq: Once | INTRAVENOUS | Status: AC
  Administered 2019-11-26: 4.37 via INTRAVENOUS

## 2019-11-26 MED ORDER — ALLOPURINOL 100 MG PO TABS: 100.0000 mg | ORAL_TABLET | Freq: Every day | ORAL | Status: DC

## 2019-11-26 MED ORDER — HALOPERIDOL LACTATE 5 MG/ML IJ SOLN
2.0000 mg | Freq: Once | INTRAMUSCULAR | Status: AC
  Administered 2019-11-26: 2 mg via INTRAVENOUS
  Filled 2019-11-26: qty 1

## 2019-11-26 MED ORDER — PROMETHAZINE HCL 25 MG/ML IJ SOLN
12.5000 mg | Freq: Once | INTRAMUSCULAR | Status: AC
  Administered 2019-11-26: 12.5 mg via INTRAVENOUS
  Filled 2019-11-26: qty 1

## 2019-11-26 MED ORDER — ASPIRIN 81 MG PO TBEC: 81.0000 mg | DELAYED_RELEASE_TABLET | Freq: Every day | ORAL | Status: DC

## 2019-11-26 MED ORDER — SPIRONOLACTONE 12.5 MG HALF TABLET: 12.5000 mg | ORAL_TABLET | Freq: Every day | ORAL | Status: DC

## 2019-11-26 MED ORDER — INSULIN ASPART 100 UNIT/ML ~~LOC~~ SOLN
7.0000 [IU] | Freq: Once | SUBCUTANEOUS | Status: AC
  Administered 2019-11-26: 7 [IU] via SUBCUTANEOUS

## 2019-11-26 MED ORDER — NITROGLYCERIN 0.4 MG SL SUBL: 0.4000 mg | SUBLINGUAL_TABLET | SUBLINGUAL | Status: DC | PRN

## 2019-11-26 MED ORDER — APIXABAN 5 MG PO TABS
5.0000 mg | ORAL_TABLET | Freq: Two times a day (BID) | ORAL | Status: DC
  Administered 2019-11-26 – 2019-11-30 (×8): 5 mg via ORAL
  Filled 2019-11-26 (×9): qty 1

## 2019-11-26 MED ORDER — METOPROLOL TARTRATE 5 MG/5ML IV SOLN: 5.0000 mg | Freq: Four times a day (QID) | INTRAVENOUS | Status: DC | PRN

## 2019-11-26 MED ORDER — INSULIN ASPART 100 UNIT/ML ~~LOC~~ SOLN
20.0000 [IU] | Freq: Three times a day (TID) | SUBCUTANEOUS | Status: DC
  Administered 2019-11-27 – 2019-11-30 (×9): 20 [IU] via SUBCUTANEOUS
  Filled 2019-11-26: qty 0.2

## 2019-11-26 MED ORDER — HYDROMORPHONE HCL 1 MG/ML IJ SOLN
1.0000 mg | Freq: Once | INTRAMUSCULAR | Status: AC
  Administered 2019-11-26: 1 mg via INTRAVENOUS
  Filled 2019-11-26: qty 1

## 2019-11-26 MED ORDER — POTASSIUM CHLORIDE 20 MEQ/15ML (10%) PO SOLN: 20.0000 meq | Freq: Once | ORAL | Status: DC

## 2019-11-26 NOTE — ED Notes (Signed)
Called phlebotomy to attempt to stick for blood cultures, they stated they were unable to.

## 2019-11-26 NOTE — ED Notes (Signed)
Pt had an episode while awake of desaturations to 89%. While asleep pt saturations dropped to 77%. When aroused her saturations came back up to 96%

## 2019-11-26 NOTE — H&P (Signed)
History and Physical        Hospital Admission Note Date: 11/26/2019  Patient name: Deborah [name] Medical record number: [id number] Date of birth: [birth year] Age: 54 y.o. Gender: female  PCP: Cleophas Dunker, DO  Patient coming from: Home   Chief Complaint    Chief Complaint  Patient presents with  . 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  In the ED her chest pain had improved with resolution of the radiation and did have some shortness of breath early in the a.m.  She did have a minimally elevated D-dimer and the ED physician discussed with the overnight hospitalist who recommended a VQ scan given her contrast allergy which came back negative for a PE.  Given that she was hemodynamically stable on room air the ED physician initially attempted to discharge the patient but due to her severe and persistent abdominal pain she did not feel comfortable with going home prompting hospitalist admission.   Currently, the patient is sleeping.  She is somnolent and arouses to verbal stimuli but falls back asleep in the middle of questioning continuously.  Unable to obtain a good history from  her or ROS.  Of note she was recently given Dilaudid, Haldol and fentanyl.  ED Course: Afebrile, tachycardic, tachypneic, hypertensive on room air.  Notable labs K3.0, glucose 170, BUN 22, creatinine 1.42 (previously 1.2 in July), magnesium 1.3, troponin 21-> 31, WBC 14.3 (previously 9 in August), Hb 10.5, platelets 447, D-dimer 0.7  Vitals:   11/26/19 1000 11/26/19 1236  BP: (!) 171/104 (!) 152/94  Pulse: (!) 118 (!) 112  Resp: 20 (!) 22  Temp:    SpO2: 100% 98%     Review of Systems:  Review of Systems  Unable to perform ROS: Mental acuity    Medical/Social/Family History   Past Medical History: Past Medical History:  Diagnosis Date  . Acute back pain with sciatica, left   . Acute back pain with sciatica, right   . AKI (acute kidney injury) ([place])   . Anemia, unspecified   . Cancer ([place])   . Carcinoid tumor of duodenum   . Chronic pain   . Chronic systolic CHF (congestive heart failure) ([place])   . Diabetes mellitus   . Esophageal reflux   . Fibromyalgia   . Gastric ulcer   . Gastroparesis   . Gout   . Hyperlipidemia   . Hypertension   . Lumbosacral stenosis   . NICM (nonischemic cardiomyopathy) ([place])   . Obesity   . PAF (paroxysmal atrial fibrillation) ([place])   .  Stroke ([place]) 02/2011  . Vitamin B12 deficiency anemia     Past Surgical History:  Procedure Laterality Date  . BIOPSY  07/27/2019   Procedure: BIOPSY;  Surgeon: Clarene Essex, MD;  Location: [facility];  Service: Endoscopy;;  . BIOPSY  07/30/2019   Procedure: BIOPSY;  Surgeon: Otis Brace, MD;  Location: [facility];  Service: Gastroenterology;;  . CATARACT EXTRACTION  01/2014  . CHOLECYSTECTOMY    . COLONOSCOPY WITH PROPOFOL N/A 07/30/2019   Procedure: COLONOSCOPY WITH PROPOFOL;  Surgeon: Otis Brace, MD;  Location: [facility];  Service: Gastroenterology;  Laterality: N/A;  . ESOPHAGOGASTRODUODENOSCOPY N/A 07/27/2019   Procedure: ESOPHAGOGASTRODUODENOSCOPY (EGD);  Surgeon: Clarene Essex,  MD;  Location: [facility];  Service: Endoscopy;  Laterality: N/A;  . ESOPHAGOGASTRODUODENOSCOPY (EGD) WITH PROPOFOL N/A 08/02/2019   Procedure: ESOPHAGOGASTRODUODENOSCOPY (EGD) WITH PROPOFOL;  Surgeon: Otis Brace, MD;  Location: [facility];  Service: Gastroenterology;  Laterality: N/A;  . HEMOSTASIS CLIP PLACEMENT  08/02/2019   Procedure: HEMOSTASIS CLIP PLACEMENT;  Surgeon: Otis Brace, MD;  Location: [facility];  Service: Gastroenterology;;  . POLYPECTOMY  07/30/2019   Procedure: POLYPECTOMY;  Surgeon: Otis Brace, MD;  Location: [facility];  Service: Gastroenterology;;  . POLYPECTOMY  08/02/2019   Procedure: POLYPECTOMY;  Surgeon: Otis Brace, MD;  Location: [facility];  Service: Gastroenterology;;    Medications: Prior to Admission medications   Medication Sig Start Date End Date Taking? Authorizing Provider  torsemide (DEMADEX) 20 MG tablet TAKE 2 TABLETS (40 MG TOTAL) BY MOUTH DAILY. Patient taking differently: Take 40 mg by mouth daily.  10/02/19  Yes Meccariello, Bernita Raisin, DO  albuterol (PROVENTIL) (2.5 MG/3ML) 0.083% nebulizer solution Take 3 mLs (2.5 mg total) by nebulization every 6 (six) hours as needed for wheezing or shortness of breath. 04/06/19   Scot Jun, FNP  allopurinol (ZYLOPRIM) 100 MG tablet Take by mouth.    [provider]  ALPRAZolam Duanne Moron) 1 MG tablet Take 1 tablet (1 mg total) by mouth 2 (two) times daily as needed for anxiety. 09/03/19   [name], DO  ASPIRIN LOW DOSE 81 MG EC tablet TAKE ONE TABLET BY MOUTH ONCE DAILY (AM) 11/14/19   Meccariello, Bernita Raisin, DO  atorvastatin (LIPITOR) 10 MG tablet TAKE ONE TABLET BY MOUTH ONCE DAILY (AM) 11/14/19   Meccariello, Bernita Raisin, DO  blood glucose meter kit and supplies KIT Dispense based on patient and insurance preference. Use up to four times daily as directed. (FOR ICD-9 250.00, 250.01). 12/13/18   Lockamy, Christia Reading, DO  busPIRone (BUSPAR) 5 MG tablet Take 1 tablet (5 mg  total) by mouth 2 (two) times daily. 11/15/19   Meccariello, Bernita Raisin, DO  cetirizine (ZYRTEC) 10 MG tablet TAKE ONE TABLET BY MOUTH ONCE DAILY (AM) 11/14/19   Meccariello, Bernita Raisin, DO  DULoxetine (CYMBALTA) 30 MG capsule Take by mouth.    [provider]  DULoxetine HCl 40 MG CPEP TAKE ONE CAPSULE BY MOUTH TWICE DAILY 10/02/19   Meccariello, Bernita Raisin, DO  EASY COMFORT PEN NEEDLES 31G X 5 MM MISC USE 3 TIMES A DAY FOR INSULIN ADMINISTRATION 11/14/19   Meccariello, Mel Almond J, DO  ELIQUIS 5 MG TABS tablet TAKE 1 TABLET BY MOUTH 2 (TWO) TIMES DAILY. 09/04/19   Lockamy, Timothy, DO  FARXIGA 10 MG TABS tablet Take 10 mg by mouth daily. 10/02/19   [provider]  ferrous sulfate 325 (65 FE) MG tablet Take 1 tablet (325 mg total) by mouth daily with breakfast. 09/01/19 10/01/19  Lesia Sago  J, MD  fluticasone (FLONASE) 50 MCG/ACT nasal spray Place 2 sprays into both nostrils daily as needed for allergies or rhinitis. 12/19/18   Rai, Ripudeep K, MD  hydrALAZINE (APRESOLINE) 25 MG tablet TAKE 1 TABLET BY MOUTH 3 (THREE) TIMES DAILY. FOR BLOOD PRESSURE 10/02/19   Meccariello, Bernita Raisin, DO  HYDROmorphone (DILAUDID) 2 MG tablet Take 1 tablet (2 mg total) by mouth daily as needed for severe pain. 11/15/19   Raulkar, Clide Deutscher, MD  HYDROmorphone (DILAUDID) 2 MG tablet Take 0.5 tablets (1 mg total) by mouth daily as needed for severe pain. 11/14/19   Raulkar, Clide Deutscher, MD  hydrOXYzine (ATARAX/VISTARIL) 10 MG tablet Take 1 tablet (10 mg total) by mouth 3 (three) times daily as needed. 11/15/19   Meccariello, Bernita Raisin, DO  insulin aspart (NOVOLOG FLEXPEN) 100 UNIT/ML FlexPen Inject 15 Units into the skin 3 (three) times daily with meals. Patient taking differently: Inject 30 Units into the skin 3 (three) times daily with meals.  06/26/19   Mariel Aloe, MD  insulin aspart protamine- aspart (NOVOLOG MIX 70/30) (70-30) 100 UNIT/ML injection Inject into the skin.    [provider]  isosorbide  mononitrate (IMDUR) 60 MG 24 hr tablet TAKE 1 TABLET (60 MG TOTAL) BY MOUTH DAILY. 10/02/19   Meccariello, Bernita Raisin, DO  LANTUS SOLOSTAR 100 UNIT/ML Solostar Pen Inject 50 Units into the skin at bedtime. 11/15/19   Meccariello, Bernita Raisin, DO  loperamide (IMODIUM) 2 MG capsule Take 2 mg by mouth 4 (four) times daily as needed for diarrhea or loose stools.    [provider]  Magnesium Chloride 64 MG TABS Take 1 tablet by mouth daily. 11/19/19   Meccariello, Bernita Raisin, DO  melatonin 3 MG TABS tablet TAKE 2 TABLETS BY MOUTH AT BEDTIME. 09/04/19   Lockamy, Timothy, DO  methocarbamol (ROBAXIN) 500 MG tablet Take by mouth.    [provider]  metoprolol succinate (TOPROL-XL) 25 MG 24 hr tablet TAKE ONE TABLET BY MOUTH ONCE DAILY 09/04/19   [name], DO  nitroGLYCERIN (NITROSTAT) 0.4 MG SL tablet Place 1 tablet (0.4 mg total) under the tongue every 5 (five) minutes as needed for chest pain. 12/13/18   Lockamy, Christia Reading, DO  ondansetron (ZOFRAN-ODT) 4 MG disintegrating tablet Take 1 tablet (4 mg total) by mouth every 8 (eight) hours as needed for nausea or vomiting. 08/09/19   Rai, Ripudeep K, MD  pantoprazole (PROTONIX) 40 MG tablet TAKE ONE TABLET BY MOUTH TWICE DAILY 09/04/19   [name], DO  pregabalin (LYRICA) 25 MG capsule Take 1 capsule (25 mg total) by mouth at bedtime. 10/10/19   Raulkar, Clide Deutscher, MD  spironolactone (ALDACTONE) 25 MG tablet Take 12.5 mg by mouth daily. 11/21/19   [provider]  sucralfate (CARAFATE) 1 g tablet Take by mouth. 11/06/19 01/05/20  [provider]    Allergies:   Allergies  Allergen Reactions  . Contrast Media [Iodinated Diagnostic Agents] Anaphylaxis    Cardiac arrest  . Diazepam [name]  . Isovue [Iopamidol] Anaphylaxis    Patient had seizure like activity and then code post 100 cc of isovue 300  . Lisinopril Anaphylaxis    Tongue and mouth swelling  . Penicillins Palpitations    Has patient had a PCN  reaction causing immediate rash, facial/tongue/throat swelling, SOB or lightheadedness with hypotension: Yes, heart races Has patient had a PCN reaction causing severe rash involving mucus membranes or skin necrosis: No Has patient had a PCN reaction  that required hospitalization: Yes  Has patient had a PCN reaction occurring within the last 10 years: No   . Acetaminophen Nausea Only and Other (See Comments)    Irritates stomach ulcer  Abdominal pain  . Tolmetin Nausea Only and Other (See Comments)    ULCER  . Aspirin Other (See Comments)    Irritates stomach ulcer   . Metoclopramide Other (See Comments)    Tardive dyskinesia  . Nsaids Other (See Comments)    ULCER  . Tramadol Nausea And Vomiting    Social History:  reports that she has never smoked. She has never used smokeless tobacco. She reports that she does not drink alcohol and does not use drugs.  Family History: Family History  Problem Relation Age of Onset  . Diabetes Mother   . Diabetes Father   . Heart disease Father   . Diabetes Sister   . Congestive Heart Failure Sister 60  . Diabetes Brother      Objective   Physical Exam: Blood pressure (!) 152/94, pulse (!) 112, temperature 97.6 F (36.4 C), temperature source Oral, resp. rate (!) 22, height '5\' 6"'  (1.676 m), weight (!) 161 kg, last menstrual period 10/10/2012, SpO2 98 %.  Physical Exam Vitals and nursing note reviewed.  Constitutional:      Appearance: She is obese. She is not ill-appearing.     Comments: Somnolent, falls asleep in mid sentence  HENT:     Head: Normocephalic.  Cardiovascular:     Rate and Rhythm: Regular rhythm. Tachycardia present.  Pulmonary:     Effort: Pulmonary effort is normal. No respiratory distress.     Breath sounds: No wheezing.  Abdominal:     General: There is no distension.     Palpations: Abdomen is soft.     Tenderness: There is generalized abdominal tenderness.  Neurological:     Mental Status: She is  disoriented.     LABS on Admission: I have personally reviewed all the labs and imaging below    Basic Metabolic Panel: Recent Labs  Lab 11/25/19 2115 11/25/19 2250 11/26/19 0230  NA QUESTIONABLE IDENTIFICATION / INCORRECTLY LABELED SPECIMEN 141  --   K QUESTIONABLE IDENTIFICATION / INCORRECTLY LABELED SPECIMEN 3.0*  --   CL QUESTIONABLE IDENTIFICATION / INCORRECTLY LABELED SPECIMEN 102  --   CO2 QUESTIONABLE IDENTIFICATION / INCORRECTLY LABELED SPECIMEN 25  --   GLUCOSE QUESTIONABLE IDENTIFICATION / INCORRECTLY LABELED SPECIMEN 170*  --   BUN QUESTIONABLE IDENTIFICATION / INCORRECTLY LABELED SPECIMEN 22*  --   CREATININE QUESTIONABLE IDENTIFICATION / INCORRECTLY LABELED SPECIMEN 1.42*  --   CALCIUM QUESTIONABLE IDENTIFICATION / INCORRECTLY LABELED SPECIMEN 9.6  --   MG  --   --  1.3*   Liver Function Tests: Recent Labs  Lab 11/25/19 2115 11/25/19 2250  AST QUESTIONABLE IDENTIFICATION / INCORRECTLY LABELED SPECIMEN 22  ALT QUESTIONABLE IDENTIFICATION / INCORRECTLY LABELED SPECIMEN 20  ALKPHOS QUESTIONABLE IDENTIFICATION / INCORRECTLY LABELED SPECIMEN 115  BILITOT QUESTIONABLE IDENTIFICATION / INCORRECTLY LABELED SPECIMEN 1.1  PROT QUESTIONABLE IDENTIFICATION / INCORRECTLY LABELED SPECIMEN 8.7*  ALBUMIN QUESTIONABLE IDENTIFICATION / INCORRECTLY LABELED SPECIMEN 3.9   Recent Labs  Lab 11/25/19 2115 11/25/19 2250  LIPASE QUESTIONABLE IDENTIFICATION / INCORRECTLY LABELED SPECIMEN 22   No results for input(s): AMMONIA in the last 168 hours. CBC: Recent Labs  Lab 11/25/19 2115 11/25/19 2115 11/25/19 2250  WBC QUESTIONABLE IDENTIFICATION / INCORRECTLY LABELED SPECIMEN  --  14.3*  HGB QUESTIONABLE IDENTIFICATION / INCORRECTLY LABELED SPECIMEN  --  10.5*  HCT  QUESTIONABLE IDENTIFICATION / INCORRECTLY LABELED SPECIMEN  --  33.1*  MCV QUESTIONABLE IDENTIFICATION / INCORRECTLY LABELED SPECIMEN   < > 89.9  PLT QUESTIONABLE IDENTIFICATION / INCORRECTLY LABELED SPECIMEN  --  447*     < > = values in this interval not displayed.   Cardiac Enzymes: No results for input(s): CKTOTAL, CKMB, CKMBINDEX, TROPONINI in the last 168 hours. BNP: Invalid input(s): POCBNP CBG: No results for input(s): GLUCAP in the last 168 hours.  Radiological Exams on Admission:  CT ABDOMEN PELVIS WO CONTRAST  Result Date: 11/26/2019 CLINICAL DATA:  Abdominal pain, acute, nonlocalized, nausea and vomiting. Duodenal carcinoid. EXAM: CT ABDOMEN AND PELVIS WITHOUT CONTRAST TECHNIQUE: Multidetector CT imaging of the abdomen and pelvis was performed following the standard protocol without IV contrast. COMPARISON:  08/23/2019 FINDINGS: Lower chest: No acute abnormality. Hepatobiliary: Cholecystectomy has been performed. At least mild hepatic steatosis and mild hepatomegaly are stable since prior examination. No focal intrahepatic mass on this noncontrast examination. No intra or extrahepatic biliary ductal dilation. Pancreas: Unremarkable Spleen: Unremarkable Adrenals/Urinary Tract: Adrenal glands are unremarkable. Kidneys are normal, without renal calculi, focal lesion, or hydronephrosis. Bladder is unremarkable. Stomach/Bowel: The stomach, small bowel, and large bowel are unremarkable. Appendix normal. No free intraperitoneal gas or fluid. Vascular/Lymphatic: No significant vascular findings are present. No enlarged abdominal or pelvic lymph nodes. Reproductive: Exophytic uterine fibroid extending into the left adnexa is again noted. The pelvic organs are otherwise unremarkable. Other: Rectum unremarkable. Musculoskeletal: Degenerative changes are seen within the lumbar spine. No lytic or blastic bone lesions are seen. IMPRESSION: No radiographic explanation for the patient's reported symptoms. Stable hepatic steatosis and hepatomegaly. Fibroid uterus. Electronically Signed   By: Fidela Salisbury MD   On: 11/26/2019 02:43   DG Chest 2 View  Result Date: 11/26/2019 CLINICAL DATA:  Chest pain and shortness of  breath EXAM: CHEST - 2 VIEW COMPARISON:  08/19/2019 FINDINGS: The heart size and mediastinal contours are within normal limits. Both lungs are clear. The visualized skeletal structures are unremarkable. IMPRESSION: No active cardiopulmonary disease. Electronically Signed   By: Ulyses Jarred M.D.   On: 11/26/2019 02:14   NM Pulmonary Perfusion  Result Date: 11/26/2019 CLINICAL DATA:  Positive D-dimer. EXAM: NUCLEAR MEDICINE PERFUSION LUNG SCAN TECHNIQUE: Perfusion images were obtained in multiple projections after intravenous injection of radiopharmaceutical. Ventilation scans intentionally deferred if perfusion scan and chest x-ray adequate for interpretation during COVID 19 epidemic. RADIOPHARMACEUTICALS:  4.37 mCi Tc-15mMAA IV COMPARISON:  Chest x-ray 11/26/2019 FINDINGS: No perfusion defects seen to suggest pulmonary embolus. IMPRESSION: No evidence of pulmonary embolus. Electronically Signed   By: KRolm BaptiseM.D.   On: 11/26/2019 12:17      EKG: Independently reviewed.    A & P   Principal Problem:   Abdominal pain Active Problems:   Uncontrolled type 2 diabetes mellitus with diabetic neuropathy, with long-term current use of insulin (HCC)   Atypical chest pain   Hypokalemia   Hypomagnesemia   Diabetic gastroparesis (HCC)   Chronic systolic CHF ([place]estive heart [place]) [place]   1. Acute on chronic abdominal pain of unclear etiology, probably from her gastroparesis a. CT abdomen pelvis without contrast unremarkable-has a contrast allergy so could not further evaluate b. Follows with pain management and PM&R who have continued her on as needed Dilaudid c. Did have some nausea and vomiting d. Continue home p.o. Dilaudid (has a number of allergies listed, pain seeking?) e. Add on Bentyl  2. Atypical chest pain a. More likely GERD  versus musculoskeletal b. Troponin flat and telemetry unremarkable c. VQ scan negative  3. Elevated D-dimer a. Negative VQ scan  b. Already on  anticoagulation  4. Acute encephalopathy, likely medication induced: Haldol, Dilaudid, fentanyl a. May also have a component of sleep apnea -evaluated at discharge b. Will try to limit sedating medications for now with the exception of above  5. SIRS, unknown source a. Tachycardic, tachypneic, leukocytosis b. IV fluids c. will check blood cultures  6. Hypokalemia  hypomagnesemia a. Replete  7. CKD 3b a. Appears at/near baseline  8. Chronic systolic heart failure, compensated a. Continue her home heart failure medications: Toprol, spironolactone, Imdur and hydralazine  9. Diabetes a. Carb controlled diet b. Reduced insulin dosing c. Diabetic coordinator consulted  10. Paroxysmal atrial fibrillation, rate controlled a. Continue Toprol XL and Eliquis b. Telemetry   DVT prophylaxis: Eliquis   Code Status: Prior  Diet: Heart healthy/carb control Family Communication: Admission, patients condition and plan of care including tests being ordered have been discussed with the patient who indicates understanding and agrees with the plan and Code Status.   Disposition Plan: The appropriate patient status for this patient is OBSERVATION. Observation status is judged to be reasonable and necessary in order to provide the required intensity of service to ensure the patient's safety. The patient's presenting symptoms, physical exam findings, and initial radiographic and laboratory data in the context of their medical condition is felt to place them at decreased risk for further clinical deterioration. Furthermore, it is anticipated that the patient will be medically stable for discharge from the hospital within 2 midnights of admission. The following factors support the patient status of observation.   " The patient's presenting symptoms include abdominal pain, nausea and vomiting. " The physical exam findings include somnolence, tenderness to palpation of the abdomen. " The initial  radiographic and laboratory data are positive for leukocytosis and electrolyte disturbance but otherwise unremarkable.    Status is: Observation  The patient remains OBS appropriate and will d/c before 2 midnights.  Dispo: The patient is from: Home              Anticipated d/c is to: Home              Anticipated d/c date is: 1 day              Patient currently is not medically stable to d/c.     Consultants  . None  Procedures  . None  Time Spent on Admission: 70 minutes    Harold Hedge, DO [facility] Pager [phone] 11/26/2019, 2:37 PM

## 2019-11-26 NOTE — ED Notes (Signed)
Pt went to V/Q scan

## 2019-11-26 NOTE — ED Provider Notes (Signed)
  Provider Note MRN:  [id number]  Arrival date & time: 11/26/19    ED Course and Medical Decision Making  Assumed care from Dr. Florina Ou at shift change.  Recent resection of neuroendocrine tumor here with chest pain, abdominal pain.  CT imaging is reassuring though limited due to inability to give IV contrast due to allergy.  Awaiting VQ scan to rule out pulmonary embolism.  Patient had some desaturations while sleeping but when awake saturations are normal.  With a negative VQ scan, will reassess and if without hypoxia may be a candidate for discharge.  Work-up reassuring, patient with continued pain that she feels this consistent with prior bouts of gastroparesis.  Continues to be tachycardic.  Admitted to hospital service for further care.  Procedures  Final Clinical Impressions(s) / ED Diagnoses     ICD-10-CM   1. Lower abdominal pain  R10.30   2. Chest pain at rest  R07.9   3. [name]  R06.02   4. Hypokalemia  E87.6   5. Hypomagnesemia  E83.42     ED Discharge Orders    None      Discharge Instructions   None     Barth Kirks. [place] Small, [place] [email or web address]    Maudie Flakes, MD 11/26/19 [phone]

## 2019-11-26 NOTE — Plan of Care (Signed)

## 2019-11-26 NOTE — ED Provider Notes (Signed)
[facility] Provider Note: Georgena Spurling, MD, FACEP  [name]: [id number] MRN: [id number] ARRIVAL: 11/25/19 at 2050 ROOM: [place]  Abdominal Pain   HISTORY OF PRESENT ILLNESS  11/26/19 12:48 AM Deborah [name] is a 54 y.o. female who had endoscopic surgery for a neuroendocrine tumor of the duodenum about 2 weeks ago in [place], [place].  She is here with lower abdominal pain that began yesterday.  She describes the pain as severe and aching in nature and rates it as a 10 out of 10.  It is worse with movement or palpation.  She has had associated nausea and vomiting.  She also developed preached cardial chest pain yesterday that radiated to her right arm.  The chest pain is improved and it no longer radiates to her right arm.  She has had shortness of breath "for a while".  When asked what she meant by this she states it is been present for over a month and a half and she has follow-up with pulmonology scheduled.  Her shortness of breath is worse than baseline.   Past Medical History:  Diagnosis Date  . Acute back pain with sciatica, left   . Acute back pain with sciatica, right   . AKI (acute kidney injury) ([place])   . Anemia, unspecified   . Cancer ([place])   . Carcinoid tumor of duodenum   . Chronic pain   . Chronic systolic CHF (congestive heart failure) ([place])   . Diabetes mellitus   . Esophageal reflux   . Fibromyalgia   . Gastric ulcer   . Gastroparesis   . Gout   . Hyperlipidemia   . Hypertension   . Lumbosacral stenosis   . NICM (nonischemic cardiomyopathy) ([place])   . Obesity   . PAF (paroxysmal atrial fibrillation) ([place])   . Stroke ([place]) 02/2011  . Vitamin B12 deficiency anemia     Past Surgical History:  Procedure Laterality Date  . BIOPSY  07/27/2019   Procedure: BIOPSY;  Surgeon: Clarene Essex, MD;  Location: [facility];  Service: Endoscopy;;  . BIOPSY  07/30/2019   Procedure: BIOPSY;  Surgeon: Otis Brace, MD;   Location: [facility];  Service: Gastroenterology;;  . CATARACT EXTRACTION  01/2014  . CHOLECYSTECTOMY    . COLONOSCOPY WITH PROPOFOL N/A 07/30/2019   Procedure: COLONOSCOPY WITH PROPOFOL;  Surgeon: Otis Brace, MD;  Location: [facility];  Service: Gastroenterology;  Laterality: N/A;  . ESOPHAGOGASTRODUODENOSCOPY N/A 07/27/2019   Procedure: ESOPHAGOGASTRODUODENOSCOPY (EGD);  Surgeon: Clarene Essex, MD;  Location: [facility];  Service: Endoscopy;  Laterality: N/A;  . ESOPHAGOGASTRODUODENOSCOPY (EGD) WITH PROPOFOL N/A 08/02/2019   Procedure: ESOPHAGOGASTRODUODENOSCOPY (EGD) WITH PROPOFOL;  Surgeon: Otis Brace, MD;  Location: [facility];  Service: Gastroenterology;  Laterality: N/A;  . HEMOSTASIS CLIP PLACEMENT  08/02/2019   Procedure: HEMOSTASIS CLIP PLACEMENT;  Surgeon: Otis Brace, MD;  Location: [facility];  Service: Gastroenterology;;  . POLYPECTOMY  07/30/2019   Procedure: POLYPECTOMY;  Surgeon: Otis Brace, MD;  Location: [facility];  Service: Gastroenterology;;  . POLYPECTOMY  08/02/2019   Procedure: POLYPECTOMY;  Surgeon: Otis Brace, MD;  Location: [facility];  Service: Gastroenterology;;    Family History  Problem Relation Age of Onset  . Diabetes Mother   . Diabetes Father   . Heart disease Father   . Diabetes Sister   . Congestive Heart Failure Sister 90  . Diabetes Brother     Social History   Tobacco Use  . Smoking status: Never Smoker  .  Smokeless tobacco: Never Used  Vaping Use  . Vaping Use: Never used  Substance Use Topics  . Alcohol use: No  . Drug use: No    Prior to Admission medications   Medication Sig Start Date End Date Taking? Authorizing Provider  albuterol (PROVENTIL) (2.5 MG/3ML) 0.083% nebulizer solution Take 3 mLs (2.5 mg total) by nebulization every 6 (six) hours as needed for wheezing or shortness of breath. 04/06/19  Yes Scot Jun, FNP  atorvastatin (LIPITOR) 10 MG tablet TAKE ONE TABLET BY MOUTH  ONCE DAILY (AM) Patient taking differently: Take 10 mg by mouth daily.  11/14/19  Yes Meccariello, Bernita Raisin, DO  busPIRone (BUSPAR) 5 MG tablet Take 1 tablet (5 mg total) by mouth 2 (two) times daily. 11/15/19  Yes Meccariello, Bernita Raisin, DO  cetirizine (ZYRTEC) 10 MG tablet TAKE ONE TABLET BY MOUTH ONCE DAILY (AM) Patient taking differently: Take 10 mg by mouth daily.  11/14/19  Yes Meccariello, Bernita Raisin, DO  DULoxetine HCl 40 MG CPEP Take 40 mg by mouth in the morning and at bedtime.   Yes [provider]  FARXIGA 10 MG TABS tablet Take 10 mg by mouth daily. 10/02/19  Yes [provider]  ferrous sulfate 325 (65 FE) MG tablet Take 1 tablet (325 mg total) by mouth daily with breakfast. 09/01/19 11/26/19 Yes Alma Friendly, MD  fluticasone (FLONASE) 50 MCG/ACT nasal spray Place 2 sprays into both nostrils daily as needed for allergies or rhinitis. 12/19/18  Yes Rai, Ripudeep K, MD  hydrALAZINE (APRESOLINE) 25 MG tablet TAKE 1 TABLET BY MOUTH 3 (THREE) TIMES DAILY. FOR BLOOD PRESSURE Patient taking differently: Take 25 mg by mouth 3 (three) times daily.  10/02/19  Yes Meccariello, Bernita Raisin, DO  HYDROmorphone (DILAUDID) 2 MG tablet Take 1 tablet (2 mg total) by mouth daily as needed for severe pain. 11/15/19  Yes Raulkar, Clide Deutscher, MD  hydrOXYzine (ATARAX/VISTARIL) 10 MG tablet Take 1 tablet (10 mg total) by mouth 3 (three) times daily as needed. Patient taking differently: Take 10 mg by mouth 3 (three) times daily as needed for itching or anxiety.  11/15/19  Yes Meccariello, Bernita Raisin, DO  insulin aspart (NOVOLOG FLEXPEN) 100 UNIT/ML FlexPen Inject 15 Units into the skin 3 (three) times daily with meals. Patient taking differently: Inject 30 Units into the skin 3 (three) times daily with meals.  06/26/19  Yes Mariel Aloe, MD  insulin aspart protamine- aspart (NOVOLOG MIX 70/30) (70-30) 100 UNIT/ML injection Inject 30 Units into the skin in the morning, at noon, and at bedtime.    Yes  [provider]  isosorbide mononitrate (IMDUR) 60 MG 24 hr tablet TAKE 1 TABLET (60 MG TOTAL) BY MOUTH DAILY. 10/02/19  Yes Meccariello, Bernita Raisin, DO  LANTUS SOLOSTAR 100 UNIT/ML Solostar Pen Inject 50 Units into the skin at bedtime. 11/15/19  Yes Meccariello, Bernita Raisin, DO  melatonin 3 MG TABS tablet TAKE 2 TABLETS BY MOUTH AT BEDTIME. 09/04/19  Yes Lockamy, Timothy, DO  methocarbamol (ROBAXIN) 500 MG tablet Take 500 mg by mouth daily.    Yes [provider]  metoprolol succinate (TOPROL-XL) 25 MG 24 hr tablet TAKE ONE TABLET BY MOUTH ONCE DAILY 09/04/19  Yes Lockamy, Timothy, DO  nitroGLYCERIN (NITROSTAT) 0.4 MG SL tablet Place 1 tablet (0.4 mg total) under the tongue every 5 (five) minutes as needed for chest pain. 12/13/18  Yes Lockamy, Timothy, DO  ondansetron (ZOFRAN-ODT) 4 MG disintegrating tablet Take 1 tablet (4 mg  total) by mouth every 8 (eight) hours as needed for nausea or vomiting. 08/09/19  Yes Rai, Ripudeep K, MD  pantoprazole (PROTONIX) 40 MG tablet TAKE ONE TABLET BY MOUTH TWICE DAILY 09/04/19  Yes Lockamy, Timothy, DO  pregabalin (LYRICA) 25 MG capsule Take 1 capsule (25 mg total) by mouth at bedtime. 10/10/19  Yes Raulkar, Clide Deutscher, MD  torsemide (DEMADEX) 20 MG tablet TAKE 2 TABLETS (40 MG TOTAL) BY MOUTH DAILY. Patient taking differently: Take 40 mg by mouth daily.  10/02/19  Yes Meccariello, Bernita Raisin, DO  ALPRAZolam Duanne Moron) 1 MG tablet Take 1 tablet (1 mg total) by mouth 2 (two) times daily as needed for anxiety. Patient not taking: Reported on 11/26/2019 09/03/19   [name], DO  blood glucose meter kit and supplies KIT Dispense based on patient and insurance preference. Use up to four times daily as directed. (FOR ICD-9 250.00, 250.01). 12/13/18   Lockamy, Timothy, DO  EASY COMFORT PEN NEEDLES 31G X 5 MM MISC USE 3 TIMES A DAY FOR INSULIN ADMINISTRATION 11/14/19   Meccariello, Mel Almond J, DO  ELIQUIS 5 MG TABS tablet TAKE 1 TABLET BY MOUTH 2 (TWO) TIMES DAILY. Patient  taking differently: Take 5 mg by mouth 2 (two) times daily.  09/04/19   [name], DO  HYDROmorphone (DILAUDID) 2 MG tablet Take 0.5 tablets (1 mg total) by mouth daily as needed for severe pain. 11/14/19   Raulkar, Clide Deutscher, MD    Allergies Contrast media [iodinated diagnostic agents], Diazepam, Isovue [iopamidol], Lisinopril, Penicillins, Acetaminophen, Tolmetin, Aspirin, Metoclopramide, Nsaids, and Tramadol   REVIEW OF SYSTEMS  Negative except as noted here or in the History of Present Illness.   PHYSICAL EXAMINATION  Initial Vital Signs Blood pressure (!) 155/120, pulse (!) 108, temperature 97.6 F (36.4 C), temperature source Oral, resp. rate (!) 24, last menstrual period 10/10/2012, SpO2 100 %.  Examination General: Well-developed, obese female in apparent mild distress; appearance consistent with age of record HENT: normocephalic; atraumatic Eyes: pupils equal, round and reactive to light; extraocular muscles intact Neck: supple Heart: regular rate and rhythm; tachycardia Lungs: Tachypnea; some mild rhonchi in the right base Abdomen: soft; obese; lower abdominal tenderness; bowel sounds hypoactive Extremities: No deformity; full range of motion; pulses normal Neurologic: Awake, alert and oriented; motor function intact in all extremities and symmetric; no facial droop Skin: Warm and dry Psychiatric: Anxious   RESULTS  Summary of this visit's results, reviewed and interpreted by myself:   EKG Interpretation  Date/Time:  Sunday November 25 2019 21:06:42 EDT Ventricular Rate:  112 PR Interval:    QRS Duration: 86 QT Interval:  371 QTC Calculation: 507 R Axis:   -27 Text Interpretation: Sinus tachycardia Artifact Confirmed by Caeleigh Prohaska [phone]) on 11/26/2019 12:50:00 AM      Laboratory Studies: Results for orders placed or performed during the hospital encounter of 11/26/19 (from the past 24 hour(s))  CBC     Status: Abnormal   Collection Time: 11/25/19  10:50 PM  Result Value Ref Range   WBC 14.3 (H) 4.0 - 10.5 K/uL   RBC 3.68 (L) 3.87 - 5.11 MIL/uL   Hemoglobin 10.5 (L) 12.0 - 15.0 g/dL   HCT 33.1 (L) 36 - 46 %   MCV 89.9 80.0 - 100.0 fL   MCH 28.5 26.0 - 34.0 pg   MCHC 31.7 30.0 - 36.0 g/dL   RDW 14.9 11.5 - 15.5 %   Platelets 447 (H) 150 - 400 K/uL   nRBC 0.0 0.0 -  0.2 %  Comprehensive metabolic panel     Status: Abnormal   Collection Time: 11/25/19 10:50 PM  Result Value Ref Range   Sodium 141 135 - 145 mmol/L   Potassium 3.0 (L) 3.5 - 5.1 mmol/L   Chloride 102 98 - 111 mmol/L   CO2 25 22 - 32 mmol/L   Glucose, Bld 170 (H) 70 - 99 mg/dL   BUN 22 (H) 6 - 20 mg/dL   Creatinine, Ser 1.42 (H) 0.44 - 1.00 mg/dL   Calcium 9.6 8.9 - 10.3 mg/dL   Total Protein 8.7 (H) 6.5 - 8.1 g/dL   Albumin 3.9 3.5 - 5.0 g/dL   AST 22 15 - 41 U/L   ALT 20 0 - 44 U/L   Alkaline Phosphatase 115 38 - 126 U/L   Total Bilirubin 1.1 0.3 - 1.2 mg/dL   GFR calc non Af Amer 41 (L) >60 mL/min   GFR calc Af Amer 48 (L) >60 mL/min   Anion gap 14 5 - 15  Lipase, blood     Status: None   Collection Time: 11/25/19 10:50 PM  Result Value Ref Range   Lipase 22 11 - 51 U/L  Troponin I (High Sensitivity)     Status: Abnormal   Collection Time: 11/25/19 10:50 PM  Result Value Ref Range   Troponin I (High Sensitivity) 21 (H) <18 ng/L  Urinalysis, Routine w reflex microscopic Urine, Clean Catch     Status: Abnormal   Collection Time: 11/26/19  1:00 AM  Result Value Ref Range   Color, Urine YELLOW YELLOW   APPearance CLEAR CLEAR   Specific Gravity, Urine 1.013 1.005 - 1.030   pH 5.0 5.0 - 8.0   Glucose, UA >=500 (A) NEGATIVE mg/dL   Hgb urine dipstick MODERATE (A) NEGATIVE   Bilirubin Urine NEGATIVE NEGATIVE   Ketones, ur 5 (A) NEGATIVE mg/dL   Protein, ur >=300 (A) NEGATIVE mg/dL   Nitrite NEGATIVE NEGATIVE   Leukocytes,Ua NEGATIVE NEGATIVE   RBC / HPF 11-20 0 - 5 RBC/hpf   WBC, UA 0-5 0 - 5 WBC/hpf   Bacteria, UA RARE (A) NONE SEEN   Mucus PRESENT    SARS Coronavirus 2 by RT PCR (hospital order, performed in [facility] hospital lab) Nasopharyngeal Nasopharyngeal Swab     Status: None   Collection Time: 11/26/19  1:08 AM   Specimen: Nasopharyngeal Swab  Result Value Ref Range   SARS Coronavirus 2 NEGATIVE NEGATIVE  Magnesium     Status: Abnormal   Collection Time: 11/26/19  2:30 AM  Result Value Ref Range   Magnesium 1.3 (L) 1.7 - 2.4 mg/dL  Troponin I (High Sensitivity)     Status: Abnormal   Collection Time: 11/26/19  2:31 AM  Result Value Ref Range   Troponin I (High Sensitivity) 31 (H) <18 ng/L  D-dimer, quantitative (not at [facility])     Status: Abnormal   Collection Time: 11/26/19  2:31 AM  Result Value Ref Range   D-Dimer, Quant 0.70 (H) 0.00 - 0.50 ug/mL-FEU  Glucose, capillary     Status: Abnormal   Collection Time: 11/26/19  9:42 PM  Result Value Ref Range   Glucose-Capillary 422 (H) 70 - 99 mg/dL   Imaging Studies: CT ABDOMEN PELVIS WO CONTRAST  Result Date: 11/26/2019 CLINICAL DATA:  Abdominal pain, acute, nonlocalized, nausea and vomiting. Duodenal carcinoid. EXAM: CT ABDOMEN AND PELVIS WITHOUT CONTRAST TECHNIQUE: Multidetector CT imaging of the abdomen and pelvis was performed following the standard protocol without  IV contrast. COMPARISON:  08/23/2019 FINDINGS: Lower chest: No acute abnormality. Hepatobiliary: Cholecystectomy has been performed. At least mild hepatic steatosis and mild hepatomegaly are stable since prior examination. No focal intrahepatic mass on this noncontrast examination. No intra or extrahepatic biliary ductal dilation. Pancreas: Unremarkable Spleen: Unremarkable Adrenals/Urinary Tract: Adrenal glands are unremarkable. Kidneys are normal, without renal calculi, focal lesion, or hydronephrosis. Bladder is unremarkable. Stomach/Bowel: The stomach, small bowel, and large bowel are unremarkable. Appendix normal. No free intraperitoneal gas or fluid. Vascular/Lymphatic: No significant vascular findings are  present. No enlarged abdominal or pelvic lymph nodes. Reproductive: Exophytic uterine fibroid extending into the left adnexa is again noted. The pelvic organs are otherwise unremarkable. Other: Rectum unremarkable. Musculoskeletal: Degenerative changes are seen within the lumbar spine. No lytic or blastic bone lesions are seen. IMPRESSION: No radiographic explanation for the patient's reported symptoms. Stable hepatic steatosis and hepatomegaly. Fibroid uterus. Electronically Signed   By: Fidela Salisbury MD   On: 11/26/2019 02:43   DG Chest 2 View  Result Date: 11/26/2019 CLINICAL DATA:  Chest pain and shortness of breath EXAM: CHEST - 2 VIEW COMPARISON:  08/19/2019 FINDINGS: The heart size and mediastinal contours are within normal limits. Both lungs are clear. The visualized skeletal structures are unremarkable. IMPRESSION: No active cardiopulmonary disease. Electronically Signed   By: Ulyses Jarred M.D.   On: 11/26/2019 02:14   NM Pulmonary Perfusion  Result Date: 11/26/2019 CLINICAL DATA:  Positive D-dimer. EXAM: NUCLEAR MEDICINE PERFUSION LUNG SCAN TECHNIQUE: Perfusion images were obtained in multiple projections after intravenous injection of radiopharmaceutical. Ventilation scans intentionally deferred if perfusion scan and chest x-ray adequate for interpretation during COVID 19 epidemic. RADIOPHARMACEUTICALS:  4.37 mCi Tc-54mMAA IV COMPARISON:  Chest x-ray 11/26/2019 FINDINGS: No perfusion defects seen to suggest pulmonary embolus. IMPRESSION: No evidence of pulmonary embolus. Electronically Signed   By: KRolm BaptiseM.D.   On: 11/26/2019 12:17    ED COURSE and MDM  Nursing notes, initial and subsequent vitals signs, including pulse oximetry, reviewed and interpreted by myself.  Vitals:   11/26/19 1845 11/26/19 1900 11/26/19 2007 11/26/19 2133  BP: 117/75 115/66 104/67 (!) 141/90  Pulse: (!) 102 100 100 98  Resp: (!) 24 (!) '23 17 20  ' Temp:    98.7 F (37.1 C)  TempSrc:    Oral  SpO2:  95% 95% 100% 100%  Weight:      Height:       Medications  potassium chloride SA (KLOR-CON) CR tablet 40 mEq (40 mEq Oral Refused 11/26/19 0524)  potassium chloride 20 MEQ/15ML (10%) solution 20 mEq (20 mEq Oral Refused 11/26/19 0556)  atorvastatin (LIPITOR) tablet 10 mg (10 mg Oral Given 11/26/19 1707)  hydrALAZINE (APRESOLINE) tablet 25 mg (25 mg Oral Given 11/26/19 1707)  isosorbide mononitrate (IMDUR) 24 hr tablet 60 mg (60 mg Oral Given 11/26/19 1706)  metoprolol succinate (TOPROL-XL) 24 hr tablet 25 mg (25 mg Oral Given 11/26/19 1707)  nitroGLYCERIN (NITROSTAT) SL tablet 0.4 mg (has no administration in time range)  torsemide (DEMADEX) tablet 40 mg (40 mg Oral Given 11/26/19 1706)  insulin glargine (LANTUS) injection 40 Units (has no administration in time range)  apixaban (ELIQUIS) tablet 5 mg (has no administration in time range)  pregabalin (LYRICA) capsule 25 mg (has no administration in time range)  dicyclomine (BENTYL) capsule 10 mg (10 mg Oral Refused 11/26/19 1710)  sodium chloride flush (NS) 0.9 % injection 3 mL (has no administration in time range)  lactated ringers infusion (0 mLs  Intravenous Stopped 11/26/19 2058)  metoprolol tartrate (LOPRESSOR) injection 5 mg (has no administration in time range)  DULoxetine (CYMBALTA) DR capsule 40 mg (has no administration in time range)  HYDROmorphone (DILAUDID) injection 1 mg (has no administration in time range)  insulin aspart (novoLOG) injection 20 Units (has no administration in time range)  insulin aspart (novoLOG) injection 7 Units (has no administration in time range)  ondansetron (ZOFRAN) injection 4 mg (4 mg Intravenous Given 11/26/19 0144)  fentaNYL (SUBLIMAZE) injection 100 mcg (100 mcg Intravenous Given 11/26/19 0143)  promethazine (PHENERGAN) injection 12.5 mg (12.5 mg Intravenous Given 11/26/19 0558)  magnesium sulfate IVPB 2 g 50 mL (0 g Intravenous Stopped 11/26/19 0715)  fentaNYL (SUBLIMAZE) injection 100 mcg (100 mcg  Intravenous Given 11/26/19 0559)  HYDROmorphone (DILAUDID) injection 1 mg (1 mg Intravenous Given 11/26/19 1036)  sodium chloride 0.9 % bolus 1,000 mL (0 mLs Intravenous Stopped 11/26/19 1300)  technetium albumin aggregated (MAA) injection solution 9.90 millicurie (9.40 millicuries Intravenous Contrast Given 11/26/19 1140)  famotidine (PEPCID) IVPB 20 mg premix (0 mg Intravenous Stopped 11/26/19 1549)  haloperidol lactate (HALDOL) injection 2 mg (2 mg Intravenous Given 11/26/19 1254)  magnesium sulfate IVPB 4 g 100 mL (0 g Intravenous Stopped 11/26/19 1945)   4:52 AM Patient denies chest pain at the present time and her shortness of breath has significantly improved.  She is still having lower abdominal pain.  The CT scan does not show any acute abnormality but this could be due to her fibrotic uterus.  I reviewed the patient's previous troponins and they have all been elevated in the 21-48 range over the past several months.  5:39 AM Discussed with hospitalist who recommends we obtain a VQ scan and defer consideration of admission until after that is done.  We will replete her potassium and magnesium in the meantime.  PROCEDURES  Procedures   ED DIAGNOSES     ICD-10-CM   1. Lower abdominal pain  R10.30   2. Chest pain at rest  R07.9   3. [name]  R06.02   4. Hypokalemia  E87.6   5. Hypomagnesemia  E83.42        Pihu Basil, Jenny Reichmann, MD 11/26/19 2231

## 2019-11-27 ENCOUNTER — Ambulatory Visit (HOSPITAL_COMMUNITY): Admission: RE | Admit: 2019-11-27 | Payer: Medicare Other | Source: Ambulatory Visit | Admitting: Gastroenterology

## 2019-11-27 ENCOUNTER — Encounter (HOSPITAL_COMMUNITY): Admission: RE | Payer: Self-pay | Source: Ambulatory Visit

## 2019-11-27 DIAGNOSIS — I5022 Chronic systolic (congestive) heart failure: Secondary | ICD-10-CM | POA: Diagnosis not present

## 2019-11-27 DIAGNOSIS — I5042 Chronic combined systolic (congestive) and diastolic (congestive) heart failure: Secondary | ICD-10-CM | POA: Diagnosis present

## 2019-11-27 DIAGNOSIS — K219 Gastro-esophageal reflux disease without esophagitis: Secondary | ICD-10-CM | POA: Diagnosis present

## 2019-11-27 DIAGNOSIS — T50905A Adverse effect of unspecified drugs, medicaments and biological substances, initial encounter: Secondary | ICD-10-CM | POA: Diagnosis present

## 2019-11-27 DIAGNOSIS — M797 Fibromyalgia: Secondary | ICD-10-CM | POA: Diagnosis present

## 2019-11-27 DIAGNOSIS — G92 Toxic encephalopathy: Secondary | ICD-10-CM | POA: Diagnosis present

## 2019-11-27 DIAGNOSIS — Z6841 Body Mass Index (BMI) 40.0 and over, adult: Secondary | ICD-10-CM | POA: Diagnosis not present

## 2019-11-27 DIAGNOSIS — D631 Anemia in chronic kidney disease: Secondary | ICD-10-CM | POA: Diagnosis present

## 2019-11-27 DIAGNOSIS — R0602 Shortness of breath: Secondary | ICD-10-CM | POA: Diagnosis present

## 2019-11-27 DIAGNOSIS — E1122 Type 2 diabetes mellitus with diabetic chronic kidney disease: Secondary | ICD-10-CM | POA: Diagnosis present

## 2019-11-27 DIAGNOSIS — N179 Acute kidney failure, unspecified: Secondary | ICD-10-CM | POA: Diagnosis present

## 2019-11-27 DIAGNOSIS — I69351 Hemiplegia and hemiparesis following cerebral infarction affecting right dominant side: Secondary | ICD-10-CM | POA: Diagnosis not present

## 2019-11-27 DIAGNOSIS — E1143 Type 2 diabetes mellitus with diabetic autonomic (poly)neuropathy: Secondary | ICD-10-CM | POA: Diagnosis present

## 2019-11-27 DIAGNOSIS — I428 Other cardiomyopathies: Secondary | ICD-10-CM | POA: Diagnosis present

## 2019-11-27 DIAGNOSIS — Z20822 Contact with and (suspected) exposure to covid-19: Secondary | ICD-10-CM | POA: Diagnosis present

## 2019-11-27 DIAGNOSIS — I480 Paroxysmal atrial fibrillation: Secondary | ICD-10-CM | POA: Diagnosis present

## 2019-11-27 DIAGNOSIS — I130 Hypertensive heart and chronic kidney disease with heart failure and stage 1 through stage 4 chronic kidney disease, or unspecified chronic kidney disease: Secondary | ICD-10-CM | POA: Diagnosis present

## 2019-11-27 DIAGNOSIS — M109 Gout, unspecified: Secondary | ICD-10-CM | POA: Diagnosis present

## 2019-11-27 DIAGNOSIS — R6510 Systemic inflammatory response syndrome (SIRS) of non-infectious origin without acute organ dysfunction: Secondary | ICD-10-CM | POA: Diagnosis present

## 2019-11-27 DIAGNOSIS — R109 Unspecified abdominal pain: Secondary | ICD-10-CM | POA: Diagnosis not present

## 2019-11-27 DIAGNOSIS — E876 Hypokalemia: Secondary | ICD-10-CM | POA: Diagnosis present

## 2019-11-27 DIAGNOSIS — N1832 Chronic kidney disease, stage 3b: Secondary | ICD-10-CM | POA: Diagnosis present

## 2019-11-27 DIAGNOSIS — E785 Hyperlipidemia, unspecified: Secondary | ICD-10-CM | POA: Diagnosis present

## 2019-11-27 DIAGNOSIS — R0789 Other chest pain: Secondary | ICD-10-CM | POA: Diagnosis not present

## 2019-11-27 DIAGNOSIS — E860 Dehydration: Secondary | ICD-10-CM | POA: Diagnosis present

## 2019-11-27 DIAGNOSIS — K3184 Gastroparesis: Secondary | ICD-10-CM | POA: Diagnosis present

## 2019-11-27 LAB — CBC
HCT: 28.8 % — ABNORMAL LOW (ref 36.0–46.0)
Hemoglobin: 8.8 g/dL — ABNORMAL LOW (ref 12.0–15.0)
MCH: 28 pg (ref 26.0–34.0)
MCHC: 30.6 g/dL (ref 30.0–36.0)
MCV: 91.7 fL (ref 80.0–100.0)
Platelets: 377 10*3/uL (ref 150–400)
RBC: 3.14 MIL/uL — ABNORMAL LOW (ref 3.87–5.11)
RDW: 15.3 % (ref 11.5–15.5)
WBC: 13.9 10*3/uL — ABNORMAL HIGH (ref 4.0–10.5)
nRBC: 0 % (ref 0.0–0.2)

## 2019-11-27 LAB — POTASSIUM: Potassium: 3.4 mmol/L — ABNORMAL LOW (ref 3.5–5.1)

## 2019-11-27 LAB — BASIC METABOLIC PANEL
Anion gap: 13 (ref 5–15)
BUN: 24 mg/dL — ABNORMAL HIGH (ref 6–20)
CO2: 26 mmol/L (ref 22–32)
Calcium: 8.3 mg/dL — ABNORMAL LOW (ref 8.9–10.3)
Chloride: 97 mmol/L — ABNORMAL LOW (ref 98–111)
Creatinine, Ser: 2.51 mg/dL — ABNORMAL HIGH (ref 0.44–1.00)
GFR calc Af Amer: 24 mL/min — ABNORMAL LOW (ref >60)
GFR calc non Af Amer: 21 mL/min — ABNORMAL LOW (ref >60)
Glucose, Bld: 273 mg/dL — ABNORMAL HIGH (ref 70–99)
Potassium: 2.8 mmol/L — ABNORMAL LOW (ref 3.5–5.1)
Sodium: 136 mmol/L (ref 135–145)

## 2019-11-27 LAB — GLUCOSE, CAPILLARY
Glucose-Capillary: 237 mg/dL — ABNORMAL HIGH (ref 70–99)
Glucose-Capillary: 175 mg/dL — ABNORMAL HIGH (ref 70–99)
Glucose-Capillary: 182 mg/dL — ABNORMAL HIGH (ref 70–99)
Glucose-Capillary: 286 mg/dL — ABNORMAL HIGH (ref 70–99)

## 2019-11-27 LAB — MAGNESIUM: Magnesium: 2.6 mg/dL — ABNORMAL HIGH (ref 1.7–2.4)

## 2019-11-27 SURGERY — ESOPHAGOGASTRODUODENOSCOPY (EGD) WITH PROPOFOL
Anesthesia: Monitor Anesthesia Care

## 2019-11-27 MED ORDER — POTASSIUM CHLORIDE CRYS ER 20 MEQ PO TBCR
40.0000 meq | EXTENDED_RELEASE_TABLET | Freq: Once | ORAL | Status: AC
  Administered 2019-11-27: 40 meq via ORAL
  Filled 2019-11-27: qty 2

## 2019-11-27 MED ORDER — SODIUM CHLORIDE 0.9% FLUSH: 10.0000 mL | INTRAVENOUS | Status: DC | PRN

## 2019-11-27 MED ORDER — FAMOTIDINE IN NACL 20-0.9 MG/50ML-% IV SOLN
20.0000 mg | Freq: Two times a day (BID) | INTRAVENOUS | Status: DC
  Administered 2019-11-27 – 2019-11-28 (×4): 20 mg via INTRAVENOUS
  Filled 2019-11-27 (×4): qty 50

## 2019-11-27 MED ORDER — MAGNESIUM SULFATE 2 GM/50ML IV SOLN: 2.0000 g | Freq: Once | INTRAVENOUS | Status: DC

## 2019-11-27 MED ORDER — SODIUM CHLORIDE 0.9% FLUSH
10.0000 mL | Freq: Two times a day (BID) | INTRAVENOUS | Status: DC
  Administered 2019-11-27 – 2019-11-29 (×3): 10 mL

## 2019-11-27 MED ORDER — POTASSIUM CHLORIDE 10 MEQ/100ML IV SOLN
10.0000 meq | INTRAVENOUS | Status: AC
  Administered 2019-11-27 (×2): 10 meq via INTRAVENOUS
  Filled 2019-11-27 (×2): qty 100

## 2019-11-27 MED ORDER — SODIUM CHLORIDE 0.9 % IV SOLN: INTRAVENOUS | Status: DC

## 2019-11-27 NOTE — Progress Notes (Signed)
PROGRESS NOTE    Deborah [name]  HKV:[id number]  DOB: 11-17-1965  PCP: Cleophas Dunker, DO Admit date:11/26/2019 Chief compliant: Nausea, vomiting. 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  ED work-up:Afebrile, tachycardic, tachypneic, hypertensive on room air.  Notable labs K3.0, glucose 170, BUN 22, creatinine 1.42 (previously 1.2 in July), magnesium 1.3, troponin 21-> 31, WBC 14.3 (previously 9 in August), Hb 10.5, platelets 447, D-dimer 0.7. In the ED her chest pain had improved with resolution of the radiation and did have some shortness of breath early in the a.m.  She did have a minimally elevated D-dimer and the ED physician discussed with the overnight hospitalist who recommended a VQ scan given her contrast allergy which came back negative for  PE.  Given that she was hemodynamically stable on room air the ED physician initially attempted to discharge the patient but due to her severe and persistent abdominal pain she did not feel comfortable with going home prompting hospitalist admission. Hospital course: Patient admitted to [facility] for further evaluation for acute on chronic abdominal pain, with home pain medication including Dilaudid and added Bentyl..  Patient also started on IV fluids for SIRS given tachycardia,  tachypnea and leukocytosis.  Blood cultures sent.  Patient's renal function was at baseline with creatinine 1.42 on admission.  She had mild hypokalemia on admission labs with potassium at 3.0 which was replaced.  Magnesium was low at 1.3 and she received 4 g of IV magnesium yesterday.   Subjective:  Patient resting comfortably.  Still reports some abdominal pain but improved from yesterday.  She states Bentyl does not help her and that she has had tremors as a side effect.  She also reports myoclonus with Reglan use in the past and was told never to use it.  She is noted to be on regular diet.  Objective: Vitals:   11/26/19 2133 11/27/19 0456 11/27/19 1003 11/27/19 1315  BP: (!) 141/90 107/64 130/75 119/77  Pulse: 98 89 89 87  Resp: 20 20 18 18   Temp: 98.7 F (37.1 C) 97.6 F (36.4 C) 98.1 F (36.7 C) 97.9 F (36.6 C)  TempSrc: Oral Oral Oral Oral  SpO2: 100% 100% 100% 98%  Weight:      Height:        Intake/Output Summary (Last 24 hours) at 11/27/2019 1753 Last data filed at 11/27/2019 1400 Gross per 24 hour  Intake 530 ml  Output --  Net 530 ml   Filed Weights   11/26/19 0114  Weight: (!) 161 kg    Physical Examination:  General: Moderately built, no acute distress noted Head ENT: Atraumatic normocephalic, PERRLA, neck supple Heart: S1-S2 heard, regular rate and rhythm, no murmurs.  No leg edema noted Lungs: Equal air entry bilaterally, no rhonchi or rales on exam, no accessory muscle use Abdomen: Bowel sounds heard, soft, mild epigastric tenderness-no guarding or rebound, nondistended. No organomegaly.  No CVA  tenderness. Extremities: No pedal edema.  No cyanosis or clubbing. Neurological: Awake alert oriented x3, no focal weakness or numbness, strength and sensations to crude touch intact Skin: No wounds or rashes.  Data Reviewed: I have personally reviewed following labs and imaging studies  CBC: Recent Labs  Lab 11/25/19 2115 11/25/19 2250 11/27/19 0506   WBC QUESTIONABLE IDENTIFICATION / INCORRECTLY LABELED SPECIMEN 14.3* 13.9*  HGB QUESTIONABLE IDENTIFICATION / INCORRECTLY LABELED SPECIMEN 10.5* 8.8*  HCT QUESTIONABLE IDENTIFICATION / INCORRECTLY LABELED SPECIMEN 33.1* 28.8*  MCV QUESTIONABLE IDENTIFICATION / INCORRECTLY LABELED SPECIMEN 89.9 91.7  PLT QUESTIONABLE IDENTIFICATION / INCORRECTLY LABELED SPECIMEN 447* 622   Basic Metabolic Panel: Recent Labs  Lab 11/25/19 2115 11/25/19 2250 11/26/19 0230 11/27/19 0500 11/27/19 0506  NA QUESTIONABLE IDENTIFICATION / INCORRECTLY LABELED SPECIMEN 141  --   --  136  K QUESTIONABLE IDENTIFICATION / INCORRECTLY LABELED SPECIMEN 3.0*  --   --  2.8*  CL QUESTIONABLE IDENTIFICATION / INCORRECTLY LABELED SPECIMEN 102  --   --  97*  CO2 QUESTIONABLE IDENTIFICATION / INCORRECTLY LABELED SPECIMEN 25  --   --  26  GLUCOSE QUESTIONABLE IDENTIFICATION / INCORRECTLY LABELED SPECIMEN 170*  --   --  273*  BUN QUESTIONABLE IDENTIFICATION / INCORRECTLY LABELED SPECIMEN 22*  --   --  24*  CREATININE QUESTIONABLE IDENTIFICATION / INCORRECTLY LABELED SPECIMEN 1.42*  --   --  2.51*  CALCIUM QUESTIONABLE IDENTIFICATION / INCORRECTLY LABELED SPECIMEN 9.6  --   --  8.3*  MG  --   --  1.3* 2.6*  --    GFR: Estimated Creatinine Clearance: 40 mL/min (A) (by C-G formula based on SCr of 2.51 mg/dL (H)). Liver Function Tests: Recent Labs  Lab 11/25/19 2115 11/25/19 2250  AST QUESTIONABLE IDENTIFICATION / INCORRECTLY LABELED SPECIMEN 22  ALT QUESTIONABLE IDENTIFICATION / INCORRECTLY LABELED SPECIMEN 20  ALKPHOS QUESTIONABLE IDENTIFICATION / INCORRECTLY LABELED SPECIMEN 115  BILITOT QUESTIONABLE IDENTIFICATION / INCORRECTLY LABELED SPECIMEN 1.1  PROT QUESTIONABLE IDENTIFICATION / INCORRECTLY LABELED SPECIMEN 8.7*  ALBUMIN QUESTIONABLE IDENTIFICATION / INCORRECTLY LABELED SPECIMEN 3.9   Recent Labs  Lab 11/25/19 2115 11/25/19 2250  LIPASE QUESTIONABLE IDENTIFICATION / INCORRECTLY LABELED SPECIMEN 22   No  results for input(s): AMMONIA in the last 168 hours. Coagulation Profile: No results for input(s): INR, PROTIME in the last 168 hours. Cardiac Enzymes: No results for input(s): CKTOTAL, CKMB, CKMBINDEX, TROPONINI in the last 168 hours. BNP (last 3 results) No results for input(s): PROBNP in the last 8760 hours. HbA1C: No results for input(s): HGBA1C in the last 72 hours. CBG: Recent Labs  Lab 11/26/19 2142 11/27/19 0813 11/27/19 1132 11/27/19 1614  GLUCAP 422* 237* 175* 182*   Lipid Profile: No results for input(s): CHOL, HDL, LDLCALC, TRIG, CHOLHDL, LDLDIRECT in the last 72 hours. Thyroid Function Tests: No results for input(s): TSH, T4TOTAL, FREET4, T3FREE, THYROIDAB in the last 72 hours. Anemia Panel: No results for input(s): VITAMINB12, FOLATE, FERRITIN, TIBC, IRON, RETICCTPCT in the last 72 hours. Sepsis Labs: No results for input(s): PROCALCITON, LATICACIDVEN in the last 168 hours.  Recent Results (from the past 240 hour(s))  SARS Coronavirus 2 by RT PCR (hospital order, performed in [facility] hospital lab) Nasopharyngeal Nasopharyngeal Swab     Status: None   Collection Time: 11/26/19  1:08 AM   Specimen: Nasopharyngeal Swab  Result Value Ref Range Status   SARS Coronavirus 2 NEGATIVE NEGATIVE Final    Comment: (NOTE) SARS-CoV-2 target nucleic acids are NOT DETECTED.  The SARS-CoV-2 RNA is generally detectable in  upper and lower respiratory specimens during the acute phase of infection. The lowest concentration of SARS-CoV-2 viral copies this assay can detect is 250 copies / mL. A negative result does not preclude SARS-CoV-2 infection and should not be used as the sole basis for treatment or other patient management decisions.  A negative result may occur with improper specimen collection / handling, submission of specimen other than nasopharyngeal swab, presence of viral mutation(s) within the areas targeted by this assay, and inadequate number of viral  copies (<250 copies / mL). A negative result must be combined with clinical observations, patient history, and epidemiological information.  Fact Sheet for Patients:   StrictlyIdeas.no  Fact Sheet for Healthcare Providers: BankingDealers.co.za  This test is not yet approved or  cleared by the [place] FDA and has been authorized for detection and/or diagnosis of SARS-CoV-2 by FDA under an Emergency Use Authorization (EUA).  This EUA will remain in effect (meaning this test can be used) for the duration of the COVID-19 declaration under Section 564(b)(1) of the Act, 21 U.S.C. section 360bbb-3(b)(1), unless the authorization is terminated or revoked sooner.  Performed at [facility], [place] [address] [place], [place] [address]   Culture, blood (routine x 2)     Status: None (Preliminary result)   Collection Time: 11/27/19  5:06 AM   Specimen: BLOOD LEFT HAND  Result Value Ref Range Status   Specimen Description   Final    BLOOD LEFT HAND Performed at [facility] [address], [place], [place] [address]    Special Requests   Final    BOTTLES DRAWN AEROBIC ONLY Blood Culture adequate volume Performed at [facility] [address] [place], [place] [address]    Culture   Final    NO GROWTH < 12 HOURS Performed at [facility] [address], [place], [place] [address]    Report Status PENDING  Incomplete  Culture, blood (routine x 2)     Status: None (Preliminary result)   Collection Time: 11/27/19  5:06 AM   Specimen: BLOOD  Result Value Ref Range Status   Specimen Description   Final    BLOOD LEFT ANTECUBITAL Performed at [facility] [address] [place], [place] [address]    Special Requests   Final    BOTTLES DRAWN AEROBIC ONLY Blood Culture adequate volume Performed at [facility] [address]  [place], [place] [address]    Culture   Final    NO GROWTH < 12 HOURS Performed at [facility] [address], [place],  [address]    Report Status PENDING  Incomplete      Radiology Studies: CT ABDOMEN PELVIS WO CONTRAST  Result Date: 11/26/2019 CLINICAL DATA:  Abdominal pain, acute, nonlocalized, nausea and vomiting. Duodenal carcinoid. EXAM: CT ABDOMEN AND PELVIS WITHOUT CONTRAST TECHNIQUE: Multidetector CT imaging of the abdomen and pelvis was performed following the standard protocol without IV contrast. COMPARISON:  08/23/2019 FINDINGS: Lower chest: No acute abnormality. Hepatobiliary: Cholecystectomy has been performed. At least mild hepatic steatosis and mild hepatomegaly are stable since prior examination. No focal intrahepatic mass on this noncontrast examination. No intra or extrahepatic biliary ductal dilation. Pancreas: Unremarkable Spleen: Unremarkable Adrenals/Urinary Tract: Adrenal glands are unremarkable. Kidneys are normal, without renal calculi, focal lesion, or hydronephrosis. Bladder is unremarkable. Stomach/Bowel: The stomach, small bowel, and large bowel are unremarkable. Appendix normal. No free intraperitoneal gas or fluid. Vascular/Lymphatic: No  significant vascular findings are present. No enlarged abdominal or pelvic lymph nodes. Reproductive: Exophytic uterine fibroid extending into the left adnexa is again noted. The pelvic organs are otherwise unremarkable. Other: Rectum unremarkable. Musculoskeletal: Degenerative changes are seen within the lumbar spine. No lytic or blastic bone lesions are seen. IMPRESSION: No radiographic explanation for the patient's reported symptoms. Stable hepatic steatosis and hepatomegaly. Fibroid uterus. Electronically Signed   By: Fidela Salisbury MD   On: 11/26/2019 02:43   DG Chest 2 View  Result Date: 11/26/2019 CLINICAL DATA:  Chest pain and shortness of breath EXAM: CHEST - 2 VIEW COMPARISON:  08/19/2019 FINDINGS: The heart size  and mediastinal contours are within normal limits. Both lungs are clear. The visualized skeletal structures are unremarkable. IMPRESSION: No active cardiopulmonary disease. Electronically Signed   By: Ulyses Jarred M.D.   On: 11/26/2019 02:14   NM Pulmonary Perfusion  Result Date: 11/26/2019 CLINICAL DATA:  Positive D-dimer. EXAM: NUCLEAR MEDICINE PERFUSION LUNG SCAN TECHNIQUE: Perfusion images were obtained in multiple projections after intravenous injection of radiopharmaceutical. Ventilation scans intentionally deferred if perfusion scan and chest x-ray adequate for interpretation during COVID 19 epidemic. RADIOPHARMACEUTICALS:  4.37 mCi Tc-56m MAA IV COMPARISON:  Chest x-ray 11/26/2019 FINDINGS: No perfusion defects seen to suggest pulmonary embolus. IMPRESSION: No evidence of pulmonary embolus. Electronically Signed   By: Rolm Baptise M.D.   On: 11/26/2019 12:17      Scheduled Meds: . apixaban  5 mg Oral BID  . atorvastatin  10 mg Oral Daily  . DULoxetine  40 mg Oral BID  . hydrALAZINE  25 mg Oral TID  . insulin aspart  20 Units Subcutaneous TID WC  . insulin glargine  40 Units Subcutaneous QHS  . isosorbide mononitrate  60 mg Oral Daily  . metoprolol succinate  25 mg Oral Daily  . potassium chloride  20 mEq Oral Once  . pregabalin  25 mg Oral QHS  . sodium chloride flush  10-40 mL Intracatheter Q12H  . sodium chloride flush  3 mL Intravenous Q12H   Continuous Infusions: . sodium chloride Stopped (11/27/19 1136)  . famotidine (PEPCID) IV 20 mg (11/27/19 1514)      Assessment/Plan:  1.  Acute on chronic abdominal pain with dyspepsia: Likely related to recurrent gastroparesis.  CT abdomen/pelvis unremarkable.  Discontinued Bentyl as patient states does not help.  She also reports myoclonus with Reglan.  She states she was advised to avoid PPI by her nephrologist.  Added IV Pepcid and advised small portions of diet.  2.  Acute renal failure on chronic kidney disease: Patient has  acute worsening of creatinine 1.4-> 2.5.  Likely in the setting of GI losses.  Will give IV hydration and repeat labs in AM.  Hold diuretics/ACE inhibitors.  3.  Severe hypokalemia: Again likely due to GI losses.  Magnesium replaced and repeat level at 2.6 now.  Will give additional potassium replacement and repeat labs in AM.  4.  SIRS: Present on admission in the setting of pain, nausea and vomiting.  Leukocytosis downtrending and likely reactive.  5.  Chronic normocytic anemia: Patient's baseline hemoglobin around 8.8-9.6.  Was elevated at 10.5 yesterday likely due to hemoconcentration.  Now at baseline level of 8.8.  No signs of acute bleeding.  6.  Chronic systolic CHF: Appears dehydrated.  Hold spironolactone and other diuretics/ACE inhibitors.  May resume Toprol, Imdur and hydralazine.  7.  Paroxysmal atrial fibrillation: Resume beta-blockers and anticoagulation with Eliquis.  8.  Diabetes mellitus type  2 with hyperlipidemia: Patient reports fair control most of the times.  Hemoglobin A1c 8.5 few months back.  Home regimen lists both Lantus and 70/30 insulin-to clarify in a.m.  Currently on 40 units of Lantus and 20 units of premeal aspart insulin with which blood glucose appears to be fairly controlled and less than 200.  Titrate up her oral intake.  DVT prophylaxis: On Eliquis Code Status: Full code Family / Patient Communication: Discussed with patient Disposition Plan:   Status is: Inpatient  Remains inpatient appropriate because:Persistent severe electrolyte disturbances Need for IV fluids for AKI  Dispo: The patient is from: Home              Anticipated d/c is to: Home              Anticipated d/c date is: 1 day if renal function normalizes and oral intake improves              Patient currently is not medically stable to d/c.          Time spent: 25 mins  >50% time spent in discussions with care team and coordination of care.    Guilford Shi, MD [facility] Pager in [place]  If 7PM-7AM, please contact night-coverage [email or web address] 11/27/2019, 5:53 PM

## 2019-11-27 NOTE — Plan of Care (Signed)

## 2019-11-28 LAB — BASIC METABOLIC PANEL
Anion gap: 10 (ref 5–15)
BUN: 31 mg/dL — ABNORMAL HIGH (ref 6–20)
CO2: 24 mmol/L (ref 22–32)
Calcium: 7.9 mg/dL — ABNORMAL LOW (ref 8.9–10.3)
Chloride: 99 mmol/L (ref 98–111)
Creatinine, Ser: 2.35 mg/dL — ABNORMAL HIGH (ref 0.44–1.00)
GFR calc Af Amer: 26 mL/min — ABNORMAL LOW (ref >60)
GFR calc non Af Amer: 23 mL/min — ABNORMAL LOW (ref >60)
Glucose, Bld: 250 mg/dL — ABNORMAL HIGH (ref 70–99)
Potassium: 3.2 mmol/L — ABNORMAL LOW (ref 3.5–5.1)
Sodium: 133 mmol/L — ABNORMAL LOW (ref 135–145)

## 2019-11-28 LAB — CBC
HCT: 28.3 % — ABNORMAL LOW (ref 36.0–46.0)
Hemoglobin: 8.4 g/dL — ABNORMAL LOW (ref 12.0–15.0)
MCH: 28.2 pg (ref 26.0–34.0)
MCHC: 29.7 g/dL — ABNORMAL LOW (ref 30.0–36.0)
MCV: 95 fL (ref 80.0–100.0)
Platelets: 315 10*3/uL (ref 150–400)
RBC: 2.98 MIL/uL — ABNORMAL LOW (ref 3.87–5.11)
RDW: 14.8 % (ref 11.5–15.5)
WBC: 11.1 10*3/uL — ABNORMAL HIGH (ref 4.0–10.5)
nRBC: 0 % (ref 0.0–0.2)

## 2019-11-28 LAB — GLUCOSE, CAPILLARY
Glucose-Capillary: 229 mg/dL — ABNORMAL HIGH (ref 70–99)
Glucose-Capillary: 196 mg/dL — ABNORMAL HIGH (ref 70–99)
Glucose-Capillary: 177 mg/dL — ABNORMAL HIGH (ref 70–99)
Glucose-Capillary: 214 mg/dL — ABNORMAL HIGH (ref 70–99)
Glucose-Capillary: 244 mg/dL — ABNORMAL HIGH (ref 70–99)

## 2019-11-28 LAB — MAGNESIUM: Magnesium: 2.4 mg/dL (ref 1.7–2.4)

## 2019-11-28 MED ORDER — ONDANSETRON HCL 4 MG/2ML IJ SOLN
4.0000 mg | Freq: Once | INTRAMUSCULAR | Status: AC
  Administered 2019-11-28: 4 mg via INTRAVENOUS
  Filled 2019-11-28: qty 2

## 2019-11-28 MED ORDER — POTASSIUM CHLORIDE CRYS ER 20 MEQ PO TBCR
40.0000 meq | EXTENDED_RELEASE_TABLET | Freq: Two times a day (BID) | ORAL | Status: DC
  Administered 2019-11-28 – 2019-11-30 (×5): 40 meq via ORAL
  Filled 2019-11-28 (×5): qty 2

## 2019-11-28 NOTE — Progress Notes (Signed)
Patient very upset and "wants the bed alarm off". States she can get up by herself to [facility]. Also states that she has no problems with ambulating that far. Advised of concern for safety, but she refused and adamant about having alarm off. Eulas Post, RN

## 2019-11-28 NOTE — Progress Notes (Signed)
Patient c/o nausea and feels "like going to vomit". Md notified for PRN med. One time dose prn given per order.Eulas Post, RN

## 2019-11-28 NOTE — Progress Notes (Signed)
Inpatient Diabetes Program Recommendations  AACE/ADA: New Consensus Statement on Inpatient Glycemic Control (2015)  Target Ranges:  Prepandial:   less than 140 mg/dL      Peak postprandial:   less than 180 mg/dL (1-2 hours)      Critically ill patients:  140 - 180 mg/dL   Lab Results  Component Value Date   GLUCAP 229 (H) 11/28/2019   HGBA1C 9.3 (A) 11/15/2019    Review of Glycemic Control  Diabetes history: DM2 Outpatient Diabetes medications: Farxiga 10 mg QD, Lantus 50 units QHS, Novolog 30 units tidwc Current orders for Inpatient glycemic control: Lantus 40 units QHS, Novolog 20 units tidwc  HgbA1C - 9.3% CBGs 250, 229  Inpatient Diabetes Program Recommendations:     Add Novolog 0-20 units tidwc and hs   Spoke with pt regarding her HgbA1C of 9.3%. Stressed importance of improved glycemic control to reduce risks of complications. Discussed correlation between blood sugar control and gastroparesis. Appeared not interested in talking about her diabetes at this time. Well-known to inpatient team.  Follow glucose trends.   Thank you. Lorenda Peck, RD, LDN, CDE Inpatient Diabetes Coordinator [phone]

## 2019-11-28 NOTE — Progress Notes (Signed)
PROGRESS NOTE    Deborah [name]  GGY:[id number] DOB: 03/16/1965 DOA: 11/26/2019 PCP: Cleophas Dunker, DO  Brief Narrative:  54 year old black female wheelchair dependent at baseline depression nonhemorrhagic CVA in the past HFpEF PAF on Eliquis Gout GERD/gastric ulcer Recurrent admissions for DKA ? 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 have Brunner's gland hyperplasia with reactive changes and is scheduled for follow-up 12/07/2019 in [place] Tardive dyskinesia in the past Most recently admitted 6/13 through 08/31/2019 with recurrent gastroparesis-she apparently cannot take Reglan due to extrapyramidal side effects  She came to the emergency room 9/20 with chest pain which resolved she had minimally elevated D-dimer and VQ scan came back negative-she started having severe persistent abdominal pain and did not feel comfortable therefore was admitted Pertinently found to have potassium 3.0 BUNs/creatinine up from 1.2-1.4 WBC 14   Assessment & Plan:   Principal Problem:   Abdominal pain Active Problems:   Uncontrolled type 2 diabetes mellitus with diabetic neuropathy, with long-term current use of insulin (HCC)   Atypical chest pain   Hypokalemia   Hypomagnesemia   Diabetic gastroparesis (HCC)   Chronic systolic CHF (congestive heart failure) ([place])   AKI (acute kidney injury) ([place])   1. Acute superimposed on chronic abdominal pain a. Probably secondary to gastroparesis-she says she will NOT follow-up with Dr. Rosalie Gums in the outpatient setting b. Bentyl was discontinued yesterday and she is intolerant to Reglan c. Continue carb moderate diet today d. Continue Dilaudid 1 mg every 3 IV as needed severe pain-Home meds include Dilaudid 2 mg tablet daily for severe pain which we will resume 2. Atypical chest pain 3. Elevated D-dimer a. VQ scan was negative no recurrence of chest pain-feels that this is likely secondary to nausea vomiting b. Continue Toprol-XL  25 daily, hydralazine 25 3 times daily, Imdur 60 daily 4. Acute encephalopathy a. ?  Undiagnosed sleep apnea b. Will need desat screen c. Is on multiple medications with risk for metabolic encephalopathy therefore Atarax 3 times daily 10 mg cetirizine 10 mg BuSpar 5 mg twice daily Robaxin 500 daily Lyrica 25 at bedtime have all been held at this time 5. SIRS a. Etiology unclear-blood cultures from 9/21 so far negative b. Continue saline at 75 cc an hour for now c. No need for antibiotics history of this was related to chest pain and vomiting episode without any fever spike 6. Hypokalemia hypomagnesemia a. Replacing orally with K. Dur, magnesium acceptable at 2.4 7. Acute superimposed on CKD 3B a. Continue saline as above-repeat labs a.m. 8. HFpEF with paroxysmal A. fib rate controlled a. Continue Toprol as above 9. DM TY 2 a. On carbohydrate modified diet right now sugars ranging 2 29-2 50 drinking mainly juice b. Continue Lantus 40 units in addition to 3 times daily insulin 20 units  DVT prophylaxis: Lovenox Code Status: Full Family Communication: None currently  Disposition:   Status is: Inpatient  Remains inpatient appropriate because:Ongoing active pain requiring inpatient pain management   Dispo:  Patient From: Home  Planned Disposition: Home  Expected discharge date: 11/28/19  Medically stable for discharge: No        Consultants:   None  Procedures: no  Antimicrobials: no   Subjective: Complaining of some abdominal pain but seems quite comfortable laying in the bed No fever no chest pain No nausea no vomiting today but had episodes of vomiting last night States peanut butter made her feel nauseous No chills no rigors  Objective: Vitals:   11/27/19 1003 11/27/19  1315 11/27/19 2102 11/28/19 0450  BP: 130/75 119/77 129/82 131/84  Pulse: 89 87 84 84  Resp: 18 18 18 18   Temp: 98.1 F (36.7 C) 97.9 F (36.6 C) 97.6 F (36.4 C) 97.9 F (36.6 C)    TempSrc: Oral Oral Oral Oral  SpO2: 100% 98% 100% 100%  Weight:      Height:        Intake/Output Summary (Last 24 hours) at 11/28/2019 1041 Last data filed at 11/28/2019 0200 Gross per 24 hour  Intake 1180.03 ml  Output --  Net 1180.03 ml   Filed Weights   11/26/19 0114  Weight: (!) 161 kg    Examination:  General exam: Awake coherent no distress EOMI NCAT no focal deficit thick neck neck soft supple however Respiratory system: Clear but limited exam given morbid obesity and habitus Cardiovascular system: S1-S2 no murmur rub or gallop telemetry shows normal sinus rhythm with PVCs Gastrointestinal system: Obese nontender no distention no rebound no guarding. Central nervous system: Moving all 4 limbs equally Extremities: No lower extremity edema obese Skin: No rash Psychiatry: Clear affect clear  Data Reviewed: I have personally reviewed following labs and imaging studies  Sodium 133 down from 136 Potassium 3.2 BUNs/creatinine 24/2.5-->31/2.3 Potassium 2.4 White count 14.3-->11.1 Hemoglobin 10.5-->8.4  Radiology Studies: NM Pulmonary Perfusion  Result Date: 11/26/2019 CLINICAL DATA:  Positive D-dimer. EXAM: NUCLEAR MEDICINE PERFUSION LUNG SCAN TECHNIQUE: Perfusion images were obtained in multiple projections after intravenous injection of radiopharmaceutical. Ventilation scans intentionally deferred if perfusion scan and chest x-ray adequate for interpretation during COVID 19 epidemic. RADIOPHARMACEUTICALS:  4.37 mCi Tc-89m MAA IV COMPARISON:  Chest x-ray 11/26/2019 FINDINGS: No perfusion defects seen to suggest pulmonary embolus. IMPRESSION: No evidence of pulmonary embolus. Electronically Signed   By: Rolm Baptise M.D.   On: 11/26/2019 12:17     Scheduled Meds: . apixaban  5 mg Oral BID  . atorvastatin  10 mg Oral Daily  . DULoxetine  40 mg Oral BID  . hydrALAZINE  25 mg Oral TID  . insulin aspart  20 Units Subcutaneous TID WC  . insulin glargine  40 Units  Subcutaneous QHS  . isosorbide mononitrate  60 mg Oral Daily  . metoprolol succinate  25 mg Oral Daily  . potassium chloride  20 mEq Oral Once  . potassium chloride SA  40 mEq Oral BID  . pregabalin  25 mg Oral QHS  . sodium chloride flush  10-40 mL Intracatheter Q12H  . sodium chloride flush  3 mL Intravenous Q12H   Continuous Infusions: . sodium chloride 75 mL/hr at 11/28/19 0445  . famotidine (PEPCID) IV 20 mg (11/28/19 1115)     LOS: 1 day    Time spent: [place], MD [facility] To contact the attending provider between 7A-7P or the covering provider during after hours 7P-7A, please log into the web site [email or web address] and access using universal [place] password for that web site. If you do not have the password, please call the hospital operator.  11/28/2019, 10:41 AM

## 2019-11-29 ENCOUNTER — Other Ambulatory Visit: Payer: Self-pay

## 2019-11-29 LAB — BASIC METABOLIC PANEL
Anion gap: 9 (ref 5–15)
BUN: 34 mg/dL — ABNORMAL HIGH (ref 6–20)
CO2: 25 mmol/L (ref 22–32)
Calcium: 8.2 mg/dL — ABNORMAL LOW (ref 8.9–10.3)
Chloride: 104 mmol/L (ref 98–111)
Creatinine, Ser: 1.76 mg/dL — ABNORMAL HIGH (ref 0.44–1.00)
GFR calc Af Amer: 37 mL/min — ABNORMAL LOW (ref >60)
GFR calc non Af Amer: 32 mL/min — ABNORMAL LOW (ref >60)
Glucose, Bld: 145 mg/dL — ABNORMAL HIGH (ref 70–99)
Potassium: 3.6 mmol/L (ref 3.5–5.1)
Sodium: 138 mmol/L (ref 135–145)

## 2019-11-29 LAB — GLUCOSE, CAPILLARY
Glucose-Capillary: 154 mg/dL — ABNORMAL HIGH (ref 70–99)
Glucose-Capillary: 129 mg/dL — ABNORMAL HIGH (ref 70–99)
Glucose-Capillary: 271 mg/dL — ABNORMAL HIGH (ref 70–99)
Glucose-Capillary: 300 mg/dL — ABNORMAL HIGH (ref 70–99)

## 2019-11-29 MED ORDER — FAMOTIDINE 20 MG PO TABS
20.0000 mg | ORAL_TABLET | Freq: Two times a day (BID) | ORAL | Status: DC
  Administered 2019-11-29 – 2019-11-30 (×3): 20 mg via ORAL
  Filled 2019-11-29 (×3): qty 1

## 2019-11-29 MED ORDER — BUSPIRONE HCL 5 MG PO TABS
5.0000 mg | ORAL_TABLET | Freq: Two times a day (BID) | ORAL | Status: DC
  Administered 2019-11-29 – 2019-11-30 (×2): 5 mg via ORAL
  Filled 2019-11-29 (×2): qty 1

## 2019-11-29 MED ORDER — HYDROMORPHONE HCL 2 MG PO TABS
2.0000 mg | ORAL_TABLET | Freq: Four times a day (QID) | ORAL | Status: DC | PRN
  Administered 2019-11-29 – 2019-11-30 (×4): 2 mg via ORAL
  Filled 2019-11-29 (×4): qty 1

## 2019-11-29 MED ORDER — HYDROMORPHONE HCL 2 MG PO TABS: 2.0000 mg | ORAL_TABLET | Freq: Every day | ORAL | Status: DC | PRN

## 2019-11-29 MED ORDER — ONDANSETRON 4 MG PO TBDP
4.0000 mg | ORAL_TABLET | Freq: Three times a day (TID) | ORAL | Status: DC | PRN
  Administered 2019-11-29: 4 mg via ORAL
  Filled 2019-11-29: qty 1

## 2019-11-29 MED ORDER — DULOXETINE HCL 20 MG PO CPEP
40.0000 mg | ORAL_CAPSULE | Freq: Two times a day (BID) | ORAL | Status: DC
  Administered 2019-11-29 – 2019-11-30 (×2): 40 mg via ORAL
  Filled 2019-11-29 (×2): qty 2

## 2019-11-29 NOTE — Progress Notes (Signed)
Patient is upset, states that she is still having a "panic attack". PCP was notified to see if we can get another order for an anti-anxiety medication.

## 2019-11-29 NOTE — Progress Notes (Signed)
PHARMACIST - PHYSICIAN COMMUNICATION  DR:   Verlon Au  CONCERNING: IV to Oral Route Change Policy  RECOMMENDATION: This patient is receiving famotidine by the intravenous route.  Based on criteria approved by the Pharmacy and Therapeutics Committee, the intravenous medication(s) is/are being converted to the equivalent oral dose form(s).   DESCRIPTION: These criteria include:  The patient is eating (either orally or via tube) and/or has been taking other orally administered medications for a least 24 hours  The patient has no evidence of active gastrointestinal bleeding or impaired GI absorption (gastrectomy, short bowel, patient on TNA or NPO).  If you have questions about this conversion, please contact the Pharmacy Department  []   [phone] )  Deborah [name] []   [phone] )  Deborah [name] []   [phone] )  Deborah [name] []   [phone] )  [facility] [x]   [phone] )  [place], PharmD, BCPS 11/29/2019 9:25 AM

## 2019-11-29 NOTE — Progress Notes (Signed)
Patient is upset about not having Xanax. States she is still anxious. PCP was notified.

## 2019-11-29 NOTE — Progress Notes (Signed)
PROGRESS NOTE    Deborah [name]  BWL:[id number] DOB: Oct 17, 1965 DOA: 11/26/2019 PCP: Cleophas Dunker, DO  Brief Narrative:  54 year old black female wheelchair dependent at baseline depression nonhemorrhagic CVA in the past HFpEF PAF on Eliquis Gout GERD/gastric ulcer Recurrent admissions for DKA ? 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 have Brunner's gland hyperplasia with reactive changes and is scheduled for follow-up 12/07/2019 in [place] Tardive dyskinesia in the past related to Reglan Most recently admitted 6/13 through 08/31/2019 with recurrent gastroparesis-she apparently cannot take Reglan due to extrapyramidal side effects  She came to the emergency room 9/20 with chest pain which resolved she had minimally elevated D-dimer and VQ scan came back negative-she started having severe persistent abdominal pain and did not feel comfortable therefore was admitted Pertinently found to have potassium 3.0 BUNs/creatinine up from 1.2-1.4 WBC 14   Assessment & Plan:   Principal Problem:   Abdominal pain Active Problems:   Uncontrolled type 2 diabetes mellitus with diabetic neuropathy, with long-term current use of insulin (HCC)   Atypical chest pain   Hypokalemia   Hypomagnesemia   Diabetic gastroparesis (HCC)   Chronic systolic CHF (congestive heart failure) ([place])   AKI (acute kidney injury) ([place])   1. Acute superimposed on chronic abdominal pain a. Probably secondary to gastroparesis-she says she will NOT follow-up with Dr. Rosalie Gums in the outpatient setting b. Bentyl was discontinued yesterday and she is intolerant to Reglan c. Continue carb moderate diet today-she is tolerating some diet d. Informed patient we will be discontinuing IV Dilaudid and changing back to Dilaudid 2 mg as per home regimen 2. Atypical chest pain 3. Elevated D-dimer a. VQ scan was negative no recurrence of chest pain-feels that this is likely secondary to nausea  vomiting b. Continue Toprol-XL 25 daily, hydralazine 25 3 times daily, Imdur 60 daily 4. Acute encephalopathy a. ?  Undiagnosed sleep apnea b. Will need desat screen c. Concern for encephalopathy we held on admission Atarax 3 times daily 10 mg cetirizine 10 mg BuSpar 5 mg twice daily Robaxin 500 daily d. We will resume cautiously BuSpar duloxetine 5. SIRS a. Etiology unclear-BC X2 NGTD b. Continue saline at 75 cc but if eating can saline lock tomorrow morning c. No need for antibiotics history of this was related to chest pain and vomiting episode without any fever spike 6. Hypokalemia hypomagnesemia a. Replacing orally with K. Dur and resolved 7. Acute superimposed on CKD 3B a. Continue saline as above-repeat labs a.m. 8. HFpEF with paroxysmal A. fib rate controlled a. Continue Toprol as above 9. DM TY 2 a. CBG ranging from 1 29-1 45 b. Continue Lantus 40 units in addition to 3 times daily insulin 20 units  DVT prophylaxis: Lovenox Code Status: Full Family Communication: None currently  Disposition:   Status is: Inpatient  Remains inpatient appropriate because:Ongoing active pain requiring inpatient pain management   Dispo:  Patient From: Home  Planned Disposition: Home  Expected discharge date: 11/30/19  Medically stable for discharge: No        Consultants:   None  Procedures: no  Antimicrobials: no   Subjective: Tolerating soup and some soft diet some nausea and medicated with Zofran and doing better Overall feels a little better No chest pain no shortness of breath No emesis today  Objective: Vitals:   11/28/19 1300 11/28/19 2044 11/29/19 0607 11/29/19 1235  BP: 132/88 127/70 126/78 111/61  Pulse: 84 82 87 79  Resp:  16 20 20   Temp: 98.5 F (  36.9 C)  97.8 F (36.6 C) 97.8 F (36.6 C)  TempSrc: Oral  Oral   SpO2: 96% 100% 98% 100%  Weight:      Height:        Intake/Output Summary (Last 24 hours) at 11/29/2019 1414 Last data filed at  11/29/2019 1259 Gross per 24 hour  Intake 620 ml  Output 400 ml  Net 220 ml   Filed Weights   11/26/19 0114  Weight: (!) 161 kg    Examination:  General exam: Thick neck on oxygen no distress Respiratory system: Clear but limited exam given morbid obesity and habitus Cardiovascular system: S1-S2 no murmur rub or gallop telemetry shows normal sinus rhythm with PVCs Gastrointestinal system: Obese nontender no distention no rebound no guarding. Central nervous system: Moving all 4 limbs equally Extremities: No lower extremity edema obese Skin: No rash Psychiatry: Clear affect clear  Data Reviewed: I have personally reviewed following labs and imaging studies  Sodium 138 Potassium 3.6 BUNs/creatinine 24/2.5-->31/2.3 34/1.7--->  Radiology Studies: No results found.   Scheduled Meds: . apixaban  5 mg Oral BID  . atorvastatin  10 mg Oral Daily  . busPIRone  5 mg Oral BID  . DULoxetine  40 mg Oral BID  . DULoxetine HCl  40 mg Oral BID  . famotidine  20 mg Oral BID  . hydrALAZINE  25 mg Oral TID  . insulin aspart  20 Units Subcutaneous TID WC  . insulin glargine  40 Units Subcutaneous QHS  . isosorbide mononitrate  60 mg Oral Daily  . metoprolol succinate  25 mg Oral Daily  . potassium chloride  20 mEq Oral Once  . potassium chloride SA  40 mEq Oral BID  . pregabalin  25 mg Oral QHS  . sodium chloride flush  10-40 mL Intracatheter Q12H  . sodium chloride flush  3 mL Intravenous Q12H   Continuous Infusions: . sodium chloride 75 mL/hr at 11/29/19 0815     LOS: 2 days    Time spent: 20  Nita Sells, MD [facility] To contact the attending provider between 7A-7P or the covering provider during after hours 7P-7A, please log into the web site [email or web address] and access using universal [place] password for that web site. If you do not have the password, please call the hospital operator.  11/29/2019, 2:14 PM

## 2019-11-29 NOTE — Progress Notes (Signed)
Patient requested Xanax. PCP was notified. Care order per PCP- no order for Xanax at this time. Will notify the patient.

## 2019-11-29 NOTE — Plan of Care (Signed)

## 2019-11-30 LAB — CBC WITH DIFFERENTIAL/PLATELET
Abs Immature Granulocytes: 0.03 10*3/uL (ref 0.00–0.07)
Basophils Absolute: 0 10*3/uL (ref 0.0–0.1)
Basophils Relative: 0 %
Eosinophils Absolute: 0.4 10*3/uL (ref 0.0–0.5)
Eosinophils Relative: 4 %
HCT: 25.9 % — ABNORMAL LOW (ref 36.0–46.0)
Hemoglobin: 7.8 g/dL — ABNORMAL LOW (ref 12.0–15.0)
Immature Granulocytes: 0 %
Lymphocytes Relative: 29 %
Lymphs Abs: 3 10*3/uL (ref 0.7–4.0)
MCH: 28.5 pg (ref 26.0–34.0)
MCHC: 30.1 g/dL (ref 30.0–36.0)
MCV: 94.5 fL (ref 80.0–100.0)
Monocytes Absolute: 0.6 10*3/uL (ref 0.1–1.0)
Monocytes Relative: 6 %
Neutro Abs: 6.2 10*3/uL (ref 1.7–7.7)
Neutrophils Relative %: 61 %
Platelets: 320 10*3/uL (ref 150–400)
RBC: 2.74 MIL/uL — ABNORMAL LOW (ref 3.87–5.11)
RDW: 14.5 % (ref 11.5–15.5)
WBC: 10.2 10*3/uL (ref 4.0–10.5)
nRBC: 0 % (ref 0.0–0.2)

## 2019-11-30 LAB — COMPREHENSIVE METABOLIC PANEL
ALT: 19 U/L (ref 0–44)
AST: 19 U/L (ref 15–41)
Albumin: 2.7 g/dL — ABNORMAL LOW (ref 3.5–5.0)
Alkaline Phosphatase: 99 U/L (ref 38–126)
Anion gap: 8 (ref 5–15)
BUN: 32 mg/dL — ABNORMAL HIGH (ref 6–20)
CO2: 24 mmol/L (ref 22–32)
Calcium: 8.3 mg/dL — ABNORMAL LOW (ref 8.9–10.3)
Chloride: 105 mmol/L (ref 98–111)
Creatinine, Ser: 1.4 mg/dL — ABNORMAL HIGH (ref 0.44–1.00)
GFR calc Af Amer: 49 mL/min — ABNORMAL LOW (ref >60)
GFR calc non Af Amer: 42 mL/min — ABNORMAL LOW (ref >60)
Glucose, Bld: 200 mg/dL — ABNORMAL HIGH (ref 70–99)
Potassium: 3.7 mmol/L (ref 3.5–5.1)
Sodium: 137 mmol/L (ref 135–145)
Total Bilirubin: 0.3 mg/dL (ref 0.3–1.2)
Total Protein: 6.2 g/dL — ABNORMAL LOW (ref 6.5–8.1)

## 2019-11-30 LAB — GLUCOSE, CAPILLARY
Glucose-Capillary: 152 mg/dL — ABNORMAL HIGH (ref 70–99)
Glucose-Capillary: 183 mg/dL — ABNORMAL HIGH (ref 70–99)

## 2019-11-30 MED ORDER — TORSEMIDE 20 MG PO TABS: 40.0000 mg | ORAL_TABLET | Freq: Every day | ORAL | Status: DC

## 2019-11-30 NOTE — Care Management Important Message (Signed)
Important Message  Patient Details IM Letter given to the Patient Name: Deborah [name] MRN: [id number] Date of Birth: Jun 19, 1965   Medicare Important Message Given:  Yes     Kerin Salen 11/30/2019, 11:12 AM

## 2019-11-30 NOTE — Progress Notes (Signed)
SATURATION QUALIFICATIONS: (This note is used to comply with regulatory documentation for home oxygen)  Patient Saturations on Room Air at Rest = 98%  Patient Saturations on Room Air while Ambulating = 95%  Patient Saturations on 0 Liters of oxygen while Ambulating = 95%  Please briefly explain why patient needs home oxygen: 

## 2019-11-30 NOTE — Discharge Summary (Signed)
Physician Discharge Summary  Deborah [name] HXT:[id number] DOB: 10-01-1965 DOA: 11/26/2019  PCP: Cleophas Dunker, DO This patient is a family medicine patient and should be admitted to [facility] in case she returns to the emergency room Admit date: 11/26/2019 Discharge date: 11/30/2019  Time spent: 30 minutes  Recommendations for Outpatient Follow-up:  1. Recommend outpatient follow-up with PCP regarding controlled substances-none were prescribed on discharge 2. Recommend oxygen 2 L at night as she probably has undiagnosed sleep apnea-PCP to coordinate [place], stop bang, split-night study 3. Check CBC and Chem-12 in about 1 to 2 weeks and replete electrolytes as needed 4. Lower dose of torsemide this admission given AKI on admission she will go home on a changed dose 5. Needs consideration for off label Urecholine use in the outpatient setting for gastroparesis  Discharge Diagnoses:  Principal Problem:   Abdominal pain Active Problems:   Uncontrolled type 2 diabetes mellitus with diabetic neuropathy, with long-term current use of insulin (HCC)   Atypical chest pain   Hypokalemia   Hypomagnesemia   Diabetic gastroparesis (HCC)   Chronic systolic CHF (congestive heart failure) (HCC)   AKI (acute kidney injury) ([place])   Discharge Condition: Guarded  Diet recommendation: Heart healthy  Filed Weights   11/26/19 0114  Weight: (!) 161 kg    History of present illness:  54 year old black female wheelchair dependent at baseline depression nonhemorrhagic CVA in the past HFpEF PAF on Eliquis Gout GERD/gastric ulcer Recurrent admissions for DKA ? 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 have Brunner's gland hyperplasia with reactive changes and is scheduled for follow-up 55/03/2019 in [place] Tardive dyskinesia in the past Most recently admitted 6/13 through 08/31/2019 with recurrent gastroparesis-she apparently cannot take Reglan due to extrapyramidal  side effects  She came to the emergency room 9/20 with chest pain which resolved she had minimally elevated D-dimer and VQ scan came back negative-she started having severe persistent abdominal pain and did not feel comfortable therefore was admitted Pertinently found to have potassium 3.0 BUNs/creatinine up from 1.2-1.4 WBC 14  Hospital Course:  1. Acute superimposed on chronic abdominal pain a. Probably secondary to gastroparesis-she says she will NOT follow-up with Dr. Rosalie Gums in the outpatient setting b. Bentyl was discontinued given inability to obtain relief she is intolerant to Reglan-may be able to use Urecholine c. Continue carb moderate diet today d. Patient asked my name for IV Dilaudid which was discontinued on 9/23 (please see my discharge summary from 08/07/2019 ) e. Home meds include Dilaudid 2 mg tablet daily for severe pain which we will resume and she will need outpatient follow-up with her PCP regarding the same 2. Atypical chest pain 3. Elevated D-dimer a. VQ scan was negative no recurrence of chest pain-feels that this is likely secondary to nausea vomiting b. Continue Toprol-XL 25 daily, hydralazine 25 3 times daily, Imdur 60 daily 4. Acute encephalopathy a. ?  Undiagnosed sleep apnea b. Will need desat screen and does not use oxygen at home c. Is on multiple medications with risk for metabolic encephalopathy therefore Atarax 3 times daily 10 mg cetirizine 10 mg BuSpar 5 mg twice daily Robaxin 500 daily Lyrica 25 at bedtime have all been held at this time d. These were reintroduced sparingly on discharge e. Patient was upset that she did not get her Xanax at night before discharge-I have explained to her clearly we would not be prescribing controlled substances on discharge 5. SIRS a. Etiology unclear-blood cultures from 9/21 so far negative b.  No need for antibiotics history of this was related to chest pain and vomiting episode without any fever spike c. White count  decreased by itself during hospital stay d. May need to consider outpatient follow-up and work-up 6. Hypokalemia hypomagnesemia a. Replacing orally with K. Dur, magnesium acceptable at 2.4 7. Acute superimposed on CKD 3B a. Co was on IV saline during hospital stay which was discontinued b. Note was on prior to admission torsemide which was discontinued and resumed at a much lower dose 8. HFpEF with paroxysmal A. fib rate controlled a. Continue Toprol as above torsemide low-dose resumed b.  9. DM TY 2 a. On carbohydrate modified diet right now sugars ranging 2 29-2 50 drinking mainly juice b. Adjusted home medications of Lantus down from 60-50 and regular sliding scale    Discharge Exam: Vitals:   11/29/19 1235 11/29/19 2020  BP: 111/61 (!) 155/91  Pulse: 79 90  Resp: 20 20  Temp: 97.8 F (36.6 C) 97.7 F (36.5 C)  SpO2: 100% 100%    General: Awake alert coherent no distress EOMI NCAT no focal deficit Cardiovascular: S1-S2 no murmur Respiratory: Clear no added sound Abdomen obese nontender no rebound Mild lower extremity edema Flat affect  Discharge Instructions   Discharge Instructions    Diet - low sodium heart healthy   Complete by: As directed    Increase activity slowly   Complete by: As directed      Allergies as of 11/30/2019      Reactions   Contrast Media [iodinated Diagnostic Agents] Anaphylaxis   Cardiac arrest   Diazepam [name]   Isovue [iopamidol] Anaphylaxis   Patient had seizure like activity and then code post 100 cc of isovue 300   Lisinopril Anaphylaxis   Tongue and mouth swelling   Penicillins Palpitations   Has patient had a PCN reaction causing immediate rash, facial/tongue/throat swelling, SOB or lightheadedness with hypotension: Yes, heart races Has patient had a PCN reaction causing severe rash involving mucus membranes or skin necrosis: No Has patient had a PCN reaction that required hospitalization: Yes  Has patient had a  PCN reaction occurring within the last 10 years: No   Acetaminophen Nausea Only, Other (See Comments)   Irritates stomach ulcer  Abdominal pain   Tolmetin Nausea Only, Other (See Comments)   ULCER   Aspirin Other (See Comments)   Irritates stomach ulcer    Metoclopramide Other (See Comments)   Tardive dyskinesia   Nsaids Other (See Comments)   ULCER   Tramadol Nausea And Vomiting      Medication List    STOP taking these medications   insulin aspart protamine- aspart (70-30) 100 UNIT/ML injection Commonly known as: NOVOLOG MIX 70/30     TAKE these medications   albuterol (2.5 MG/3ML) 0.083% nebulizer solution Commonly known as: PROVENTIL Take 3 mLs (2.5 mg total) by nebulization every 6 (six) hours as needed for wheezing or shortness of breath.   ALPRAZolam 1 MG tablet Commonly known as: XANAX Take 1 tablet (1 mg total) by mouth 2 (two) times daily as needed for anxiety.   atorvastatin 10 MG tablet Commonly known as: LIPITOR TAKE ONE TABLET BY MOUTH ONCE DAILY (AM) What changed: See the new instructions.   blood glucose meter kit and supplies Kit Dispense based on patient and insurance preference. Use up to four times daily as directed. (FOR ICD-9 250.00, 250.01).   busPIRone 5 MG tablet Commonly known as: BUSPAR Take 1 tablet (5 mg  total) by mouth 2 (two) times daily.   cetirizine 10 MG tablet Commonly known as: ZYRTEC TAKE ONE TABLET BY MOUTH ONCE DAILY (AM) What changed: See the new instructions.   DULoxetine HCl 40 MG Cpep Take 40 mg by mouth in the morning and at bedtime.   Easy Comfort Pen Needles 31G X 5 MM Misc Generic drug: Insulin Pen Needle USE 3 TIMES A DAY FOR INSULIN ADMINISTRATION   Eliquis 5 MG Tabs tablet Generic drug: apixaban TAKE 1 TABLET BY MOUTH 2 (TWO) TIMES DAILY. What changed: See the new instructions.   Farxiga 10 MG Tabs tablet Generic drug: dapagliflozin propanediol Take 10 mg by mouth daily.   ferrous sulfate 325 (65 FE) MG  tablet Take 1 tablet (325 mg total) by mouth daily with breakfast.   fluticasone 50 MCG/ACT nasal spray Commonly known as: FLONASE Place 2 sprays into both nostrils daily as needed for allergies or rhinitis.   hydrALAZINE 25 MG tablet Commonly known as: APRESOLINE TAKE 1 TABLET BY MOUTH 3 (THREE) TIMES DAILY. FOR BLOOD PRESSURE What changed: See the new instructions.   HYDROmorphone 2 MG tablet Commonly known as: Dilaudid Take 0.5 tablets (1 mg total) by mouth daily as needed for severe pain.   HYDROmorphone 2 MG tablet Commonly known as: DILAUDID Take 1 tablet (2 mg total) by mouth daily as needed for severe pain.   hydrOXYzine 10 MG tablet Commonly known as: ATARAX/VISTARIL Take 1 tablet (10 mg total) by mouth 3 (three) times daily as needed. What changed: reasons to take this   isosorbide mononitrate 60 MG 24 hr tablet Commonly known as: IMDUR TAKE 1 TABLET (60 MG TOTAL) BY MOUTH DAILY.   Lantus SoloStar 100 UNIT/ML Solostar Pen Generic drug: insulin glargine Inject 50 Units into the skin at bedtime.   melatonin 3 MG Tabs tablet TAKE 2 TABLETS BY MOUTH AT BEDTIME.   methocarbamol 500 MG tablet Commonly known as: ROBAXIN Take 500 mg by mouth daily.   metoprolol succinate 25 MG 24 hr tablet Commonly known as: TOPROL-XL TAKE ONE TABLET BY MOUTH ONCE DAILY   nitroGLYCERIN 0.4 MG SL tablet Commonly known as: NITROSTAT Place 1 tablet (0.4 mg total) under the tongue every 5 (five) minutes as needed for chest pain.   NovoLOG FlexPen 100 UNIT/ML FlexPen Generic drug: insulin aspart Inject 15 Units into the skin 3 (three) times daily with meals. What changed: how much to take   ondansetron 4 MG disintegrating tablet Commonly known as: ZOFRAN-ODT Take 1 tablet (4 mg total) by mouth every 8 (eight) hours as needed for nausea or vomiting.   pantoprazole 40 MG tablet Commonly known as: PROTONIX TAKE ONE TABLET BY MOUTH TWICE DAILY   pregabalin 25 MG capsule Commonly  known as: Lyrica Take 1 capsule (25 mg total) by mouth at bedtime.   torsemide 20 MG tablet Commonly known as: DEMADEX Take 2 tablets (40 mg total) by mouth daily. What changed: See the new instructions.            Durable Medical Equipment  (From admission, onward)         Start     Ordered   11/30/19 0849  DME Oxygen  Once       Question Answer Comment  Length of Need 6 Months   Mode or (Route) Nasal cannula   Liters per Minute 2   Oxygen delivery system Gas      11/30/19 0849         Allergies  Allergen Reactions  . Contrast Media [Iodinated Diagnostic Agents] Anaphylaxis    Cardiac arrest  . Diazepam [name]  . Isovue [Iopamidol] Anaphylaxis    Patient had seizure like activity and then code post 100 cc of isovue 300  . Lisinopril Anaphylaxis    Tongue and mouth swelling  . Penicillins Palpitations    Has patient had a PCN reaction causing immediate rash, facial/tongue/throat swelling, SOB or lightheadedness with hypotension: Yes, heart races Has patient had a PCN reaction causing severe rash involving mucus membranes or skin necrosis: No Has patient had a PCN reaction that required hospitalization: Yes  Has patient had a PCN reaction occurring within the last 10 years: No   . Acetaminophen Nausea Only and Other (See Comments)    Irritates stomach ulcer  Abdominal pain  . Tolmetin Nausea Only and Other (See Comments)    ULCER  . Aspirin Other (See Comments)    Irritates stomach ulcer   . Metoclopramide Other (See Comments)    Tardive dyskinesia  . Nsaids Other (See Comments)    ULCER  . Tramadol Nausea And Vomiting      The results of significant diagnostics from this hospitalization (including imaging, microbiology, ancillary and laboratory) are listed below for reference.    Significant Diagnostic Studies: CT ABDOMEN PELVIS WO CONTRAST  Result Date: 11/26/2019 CLINICAL DATA:  Abdominal pain, acute, nonlocalized, nausea and vomiting.  Duodenal carcinoid. EXAM: CT ABDOMEN AND PELVIS WITHOUT CONTRAST TECHNIQUE: Multidetector CT imaging of the abdomen and pelvis was performed following the standard protocol without IV contrast. COMPARISON:  08/23/2019 FINDINGS: Lower chest: No acute abnormality. Hepatobiliary: Cholecystectomy has been performed. At least mild hepatic steatosis and mild hepatomegaly are stable since prior examination. No focal intrahepatic mass on this noncontrast examination. No intra or extrahepatic biliary ductal dilation. Pancreas: Unremarkable Spleen: Unremarkable Adrenals/Urinary Tract: Adrenal glands are unremarkable. Kidneys are normal, without renal calculi, focal lesion, or hydronephrosis. Bladder is unremarkable. Stomach/Bowel: The stomach, small bowel, and large bowel are unremarkable. Appendix normal. No free intraperitoneal gas or fluid. Vascular/Lymphatic: No significant vascular findings are present. No enlarged abdominal or pelvic lymph nodes. Reproductive: Exophytic uterine fibroid extending into the left adnexa is again noted. The pelvic organs are otherwise unremarkable. Other: Rectum unremarkable. Musculoskeletal: Degenerative changes are seen within the lumbar spine. No lytic or blastic bone lesions are seen. IMPRESSION: No radiographic explanation for the patient's reported symptoms. Stable hepatic steatosis and hepatomegaly. Fibroid uterus. Electronically Signed   By: Fidela Salisbury MD   On: 11/26/2019 02:43   DG Chest 2 View  Result Date: 11/26/2019 CLINICAL DATA:  Chest pain and shortness of breath EXAM: CHEST - 2 VIEW COMPARISON:  08/19/2019 FINDINGS: The heart size and mediastinal contours are within normal limits. Both lungs are clear. The visualized skeletal structures are unremarkable. IMPRESSION: No active cardiopulmonary disease. Electronically Signed   By: Ulyses Jarred M.D.   On: 11/26/2019 02:14   NM Pulmonary Perfusion  Result Date: 11/26/2019 CLINICAL DATA:  Positive D-dimer. EXAM:  NUCLEAR MEDICINE PERFUSION LUNG SCAN TECHNIQUE: Perfusion images were obtained in multiple projections after intravenous injection of radiopharmaceutical. Ventilation scans intentionally deferred if perfusion scan and chest x-ray adequate for interpretation during COVID 19 epidemic. RADIOPHARMACEUTICALS:  4.37 mCi Tc-2mMAA IV COMPARISON:  Chest x-ray 11/26/2019 FINDINGS: No perfusion defects seen to suggest pulmonary embolus. IMPRESSION: No evidence of pulmonary embolus. Electronically Signed   By: KRolm BaptiseM.D.   On: 11/26/2019 12:17    Microbiology: Recent Results (from  the past 240 hour(s))  SARS Coronavirus 2 by RT PCR (hospital order, performed in [facility] hospital lab) Nasopharyngeal Nasopharyngeal Swab     Status: None   Collection Time: 11/26/19  1:08 AM   Specimen: Nasopharyngeal Swab  Result Value Ref Range Status   SARS Coronavirus 2 NEGATIVE NEGATIVE Final    Comment: (NOTE) SARS-CoV-2 target nucleic acids are NOT DETECTED.  The SARS-CoV-2 RNA is generally detectable in upper and lower respiratory specimens during the acute phase of infection. The lowest concentration of SARS-CoV-2 viral copies this assay can detect is 250 copies / mL. A negative result does not preclude SARS-CoV-2 infection and should not be used as the sole basis for treatment or other patient management decisions.  A negative result may occur with improper specimen collection / handling, submission of specimen other than nasopharyngeal swab, presence of viral mutation(s) within the areas targeted by this assay, and inadequate number of viral copies (<250 copies / mL). A negative result must be combined with clinical observations, patient history, and epidemiological information.  Fact Sheet for Patients:   StrictlyIdeas.no  Fact Sheet for Healthcare Providers: BankingDealers.co.za  This test is not yet approved or  cleared by the [place] FDA  and has been authorized for detection and/or diagnosis of SARS-CoV-2 by FDA under an Emergency Use Authorization (EUA).  This EUA will remain in effect (meaning this test can be used) for the duration of the COVID-19 declaration under Section 564(b)(1) of the Act, 21 U.S.C. section 360bbb-3(b)(1), unless the authorization is terminated or revoked sooner.  Performed at [facility], [place] [address] [place], [place] [address]   Culture, blood (routine x 2)     Status: None (Preliminary result)   Collection Time: 11/27/19  5:06 AM   Specimen: BLOOD LEFT HAND  Result Value Ref Range Status   Specimen Description   Final    BLOOD LEFT HAND Performed at [place] [address], [place], [place] [address]    Special Requests   Final    BOTTLES DRAWN AEROBIC ONLY Blood Culture adequate volume Performed at [place] [address], [place], [place] [address]    Culture   Final    NO GROWTH 2 DAYS Performed at [place] [address], [place], [place] [address]    Report Status PENDING  Incomplete  Culture, blood (routine x 2)     Status: None (Preliminary result)   Collection Time: 11/27/19  5:06 AM   Specimen: BLOOD  Result Value Ref Range Status   Specimen Description   Final    BLOOD LEFT ANTECUBITAL Performed at [place] [address], [place], [place] [address]    Special Requests   Final    BOTTLES DRAWN AEROBIC ONLY Blood Culture adequate volume Performed at [place] [address] [place], [place] [address]    Culture   Final    NO GROWTH 2 DAYS Performed at [place] [address] [place],  [address]    Report Status PENDING  Incomplete     Labs: Basic Metabolic Panel: Recent Labs  Lab 11/25/19 2250 11/25/19 2250 11/26/19 0230 11/27/19 0500 11/27/19 0506 11/27/19 1921 11/28/19 0417 11/29/19 0355 11/30/19 0356  NA  141  --   --   --  136  --  133* 138 137  K 3.0*   < >  --   --  2.8* 3.4* 3.2*  3.6 3.7  CL 102  --   --   --  97*  --  99 104 105  CO2 25  --   --   --  26  --  '24 25 24  ' GLUCOSE 170*  --   --   --  273*  --  250* 145* 200*  BUN 22*  --   --   --  24*  --  31* 34* 32*  CREATININE 1.42*  --   --   --  2.51*  --  2.35* 1.76* 1.40*  CALCIUM 9.6  --   --   --  8.3*  --  7.9* 8.2* 8.3*  MG  --   --  1.3* 2.6*  --   --  2.4  --   --    < > = values in this interval not displayed.   Liver Function Tests: Recent Labs  Lab 11/25/19 2115 11/25/19 2250 11/30/19 0356  AST QUESTIONABLE IDENTIFICATION / INCORRECTLY LABELED SPECIMEN 22 19  ALT QUESTIONABLE IDENTIFICATION / INCORRECTLY LABELED SPECIMEN 20 19  ALKPHOS QUESTIONABLE IDENTIFICATION / INCORRECTLY LABELED SPECIMEN 115 99  BILITOT QUESTIONABLE IDENTIFICATION / INCORRECTLY LABELED SPECIMEN 1.1 0.3  PROT QUESTIONABLE IDENTIFICATION / INCORRECTLY LABELED SPECIMEN 8.7* 6.2*  ALBUMIN QUESTIONABLE IDENTIFICATION / INCORRECTLY LABELED SPECIMEN 3.9 2.7*   Recent Labs  Lab 11/25/19 2115 11/25/19 2250  LIPASE QUESTIONABLE IDENTIFICATION / INCORRECTLY LABELED SPECIMEN 22   No results for input(s): AMMONIA in the last 168 hours. CBC: Recent Labs  Lab 11/25/19 2115 11/25/19 2250 11/27/19 0506 11/28/19 0417 11/30/19 0356  WBC QUESTIONABLE IDENTIFICATION / INCORRECTLY LABELED SPECIMEN 14.3* 13.9* 11.1* 10.2  NEUTROABS  --   --   --   --  6.2  HGB QUESTIONABLE IDENTIFICATION / INCORRECTLY LABELED SPECIMEN 10.5* 8.8* 8.4* 7.8*  HCT QUESTIONABLE IDENTIFICATION / INCORRECTLY LABELED SPECIMEN 33.1* 28.8* 28.3* 25.9*  MCV QUESTIONABLE IDENTIFICATION / INCORRECTLY LABELED SPECIMEN 89.9 91.7 95.0 94.5  PLT QUESTIONABLE IDENTIFICATION / INCORRECTLY LABELED SPECIMEN 447* 377 315 320   Cardiac Enzymes: No results for input(s): CKTOTAL, CKMB, CKMBINDEX, TROPONINI in the last 168 hours. BNP: BNP (last 3 results) Recent Labs    06/22/19 2123  07/02/19 1327 08/19/19 1619  BNP 70.3 61.4 60.8    ProBNP (last 3 results) No results for input(s): PROBNP in the last 8760 hours.  CBG: Recent Labs  Lab 11/29/19 0810 11/29/19 1207 11/29/19 1708 11/29/19 2017 11/30/19 0803  GLUCAP 154* 129* 271* 300* 152*       Signed:  Nita Sells MD   [facility] 11/30/2019, 8:49 AM

## 2019-12-02 LAB — CULTURE, BLOOD (ROUTINE X 2)
Culture: NO GROWTH
Culture: NO GROWTH
Special Requests: ADEQUATE
Special Requests: ADEQUATE

## 2019-12-05 ENCOUNTER — Ambulatory Visit: Payer: Medicare Other

## 2019-12-07 NOTE — Patient Instructions (Signed)
Visit Information  Goals Addressed              This Visit's Progress   .  I been in the hospital 2-3 times for my Blood sugar (pt-stated)        CARE PLAN ENTRY (see longtitudinal plan of care for additional care plan information)  Objective:  Lab Results  Component Value Date   HGBA1C 9.3 (A) 11/15/2019 .   Lab Results  Component Value Date   CREATININE 1.40 (H) 11/30/2019   CREATININE 1.76 (H) 11/29/2019   CREATININE 2.35 (H) 11/28/2019 .   [name] No results found for: EGFR  Current Barriers:  [name] Knowledge Deficits related to basic Diabetes pathophysiology and self care/management   Case Manager Clinical Goal(s):  Over the next 30 days, patient will demonstrate improved adherence to prescribed treatment plan for diabetes self care/management as evidenced by: remaining out of the hospital   Interventions:  . Provided education to patient about basic DM disease process . Reviewed medications with patient and discussed importance of medication adherence . Discussed plans with patient for ongoing care management follow up and provided patient with direct contact information for care management team . Provided patient with written educational materials related to hypo and hyperglycemia and importance of correct treatment . Advised patient, providing education and rationale, to check cbg and record, calling the office for findings outside established parameters.   . The patient states that she never has lows.  She only had a low 1 time and then she was clammy and disoriented. . When her blood sugar is high, she will start to vomit.  The highest she stated was in the 400's. . I would like to send the patient some educational material on diabetes Living well with Diabetes, Cirrhosis, Calendar . Talking with the patient she stated that she was in the car on her way back to [place]. They are going to do another test to make sure that they have removed all of the cancer. . She states that  she has only had her mind on getting this test done and has not been checking her blood sugars.  She knows that they check it when she is there but could not remember the  numbers to give me any values. . She states that she will start back checking them when thing have calmed down. Patient Self Care Activities:  . Self administers oral medications as prescribed . Attends all scheduled provider appointments  Please see past updates related to this goal by clicking on the "Past Updates" button in the selected goal      .  I need a walker and recliner        [place] (see longitudinal plan of care for additional care plan information)  Current Barriers:  . Patient with chronic medical conditions states she needs a walker and a recliner so that she can raise her feet to prevent swelling,  . Acknowledges deficits and needs support, education and care coordination in order to meet this unmet need  . Patient received hard Rx from PCP for rolling walker and  recliner, she is having difficulty finding someone that will provide it for her Clinical Goal(s):  Over the next 30 days, patient will receive DME and be able to safely meet needs.  Interventions: . Assessed patient needs, and how currently meeting needs . Collaborated with PCP regarding patient needs and request  . Collaborated with CCM RN care manager regarding options that would meet  patient needs . CCM RN care manager will explore various options and provide patient with an update . Patient states that she has received her lift chair and she is very pleased with it . [name] She is waiting for the rolator. Patient Self Care Activities: . Patient is unable to independently navigate getting DME without care coordination support  Please see past updates related to this goal by clicking on the "Past Updates" button in the selected goal          Ms. Mcgahee was given information about Care Management services today including:   1. Care Management services include personalized support from designated clinical staff supervised by her physician, including individualized plan of care and coordination with other care providers 2. 24/7 contact phone numbers for assistance for urgent and routine care needs. 3. The patient may stop CCM services at any time (effective at the end of the month) by phone call to the office staff.  Patient agreed to services and verbal consent obtained.   The patient verbalized understanding of instructions provided today and declined a print copy of patient instruction materials.   The care management team will reach out to the patient again over the next 21 days.   Lazaro Arms RN, BSN, [facility] Care Management Coordinator [place] Phone: [phone] Fax: [phone]

## 2019-12-07 NOTE — Chronic Care Management (AMB) (Signed)
Care Management   Follow Up Note   12/07/2019 Name: Deborah [name] MRN: [id number] DOB: 06-22-1965  Referred by: Cleophas Dunker, DO Reason for referral : Chronic Care Management (DM II)   Deborah [name] is a 54 y.o. year old female who is a primary care patient of Cleophas Dunker, DO. The care management team was consulted for assistance with care management and care coordination needs.    Review of patient status, including review of consultants reports, relevant laboratory and other test results, and collaboration with appropriate care team members and the patient's provider was performed as part of comprehensive patient evaluation and provision of chronic care management services.    SDOH (Social Determinants of Health) assessments performed: No See Care Plan activities for detailed interventions related to [facility])     Advanced Directives: See Care Plan and Vynca application for related entries.   Goals Addressed              This Visit's Progress   .  I been in the hospital 2-3 times for my Blood sugar (pt-stated)        CARE PLAN ENTRY (see longtitudinal plan of care for additional care plan information)  Objective:  Lab Results  Component Value Date   HGBA1C 9.3 (A) 11/15/2019 .   Lab Results  Component Value Date   CREATININE 1.40 (H) 11/30/2019   CREATININE 1.76 (H) 11/29/2019   CREATININE 2.35 (H) 11/28/2019 .   [name] No results found for: EGFR  Current Barriers:  [name] Knowledge Deficits related to basic Diabetes pathophysiology and self care/management   Case Manager Clinical Goal(s):  Over the next 30 days, patient will demonstrate improved adherence to prescribed treatment plan for diabetes self care/management as evidenced by: remaining out of the hospital   Interventions:  . Provided education to patient about basic DM disease process . Reviewed medications with patient and discussed importance of medication adherence . Discussed  plans with patient for ongoing care management follow up and provided patient with direct contact information for care management team . Provided patient with written educational materials related to hypo and hyperglycemia and importance of correct treatment . Advised patient, providing education and rationale, to check cbg and record, calling the office for findings outside established parameters.   . The patient states that she never has lows.  She only had a low 1 time and then she was clammy and disoriented. . When her blood sugar is high, she will start to vomit.  The highest she stated was in the 400's. . I would like to send the patient some educational material on diabetes Living well with Diabetes, Cirrhosis, Calendar . Talking with the patient she stated that she was in the car on her way back to [place]. They are going to do another test to make sure that they have removed all of the cancer. . She states that she has only had her mind on getting this test done and has not been checking her blood sugars.  She knows that they check it when she is there but could not remember the  numbers to give me any values. . She states that she will start back checking them when thing have calmed down. Patient Self Care Activities:  . Self administers oral medications as prescribed . Attends all scheduled provider appointments  Please see past updates related to this goal by clicking on the "Past Updates" button in the selected goal      .  I need a walker and recliner        CARE PLAN ENTRY (see longitudinal plan of care for additional care plan information)  Current Barriers:  . Patient with chronic medical conditions states she needs a walker and a recliner so that she can raise her feet to prevent swelling,  . Acknowledges deficits and needs support, education and care coordination in order to meet this unmet need  . Patient received hard Rx from PCP for rolling walker and  recliner, she is having  difficulty finding someone that will provide it for her Clinical Goal(s):  Over the next 30 days, patient will receive DME and be able to safely meet needs.  Interventions: . Assessed patient needs, and how currently meeting needs . Collaborated with PCP regarding patient needs and request  . Collaborated with CCM RN care manager regarding options that would meet patient needs . CCM RN care manager will explore various options and provide patient with an update . Patient states that she has received her lift chair and she is very pleased with it . [name] She is waiting for the rolator. Patient Self Care Activities: . Patient is unable to independently navigate getting DME without care coordination support  Please see past updates related to this goal by clicking on the "Past Updates" button in the selected goal           The care management team will reach out to the patient again over the next 21 days.   Lazaro Arms RN, BSN, [facility] Care Management Coordinator [place] Phone: [phone] Fax: [phone]

## 2019-12-17 ENCOUNTER — Other Ambulatory Visit: Payer: Self-pay | Admitting: Physical Medicine and Rehabilitation

## 2019-12-19 ENCOUNTER — Ambulatory Visit: Payer: Medicare Other | Admitting: Cardiology

## 2019-12-19 ENCOUNTER — Encounter
Payer: Medicare Other | Attending: Physical Medicine and Rehabilitation | Admitting: Physical Medicine and Rehabilitation

## 2019-12-19 DIAGNOSIS — C7A010 Malignant carcinoid tumor of the duodenum: Secondary | ICD-10-CM | POA: Insufficient documentation

## 2019-12-19 DIAGNOSIS — R5381 Other malaise: Secondary | ICD-10-CM | POA: Insufficient documentation

## 2019-12-19 DIAGNOSIS — R1010 Upper abdominal pain, unspecified: Secondary | ICD-10-CM | POA: Insufficient documentation

## 2019-12-19 DIAGNOSIS — Z79891 Long term (current) use of opiate analgesic: Secondary | ICD-10-CM | POA: Insufficient documentation

## 2019-12-19 DIAGNOSIS — G893 Neoplasm related pain (acute) (chronic): Secondary | ICD-10-CM | POA: Insufficient documentation

## 2019-12-19 DIAGNOSIS — G894 Chronic pain syndrome: Secondary | ICD-10-CM | POA: Insufficient documentation

## 2019-12-19 DIAGNOSIS — Z5181 Encounter for therapeutic drug level monitoring: Secondary | ICD-10-CM | POA: Insufficient documentation

## 2019-12-20 ENCOUNTER — Ambulatory Visit: Payer: Medicare Other | Admitting: Family Medicine

## 2019-12-20 ENCOUNTER — Other Ambulatory Visit: Payer: Self-pay | Admitting: Family Medicine

## 2019-12-20 NOTE — Progress Notes (Deleted)
° ° °  SUBJECTIVE:   CHIEF COMPLAINT / HPI:    Hospital follow-up Patient recently admitted to the hospital in [facility] for endoscopy, also had admission for right-sided throat swelling that resulted in incision and drainage of a parapharyngeal abscess ***  Neuroendocrine tumor/cirrhosis Patient was seen by GI at [facility] in [facility] They referred her to [facility] to see if there is anything that can be added for her gastroparesis 2 months ago she is also had a Botox injection in pyloric area for gastroparesis Cirrhosis is likely burned-out [place], EGD did not show varices.  She will need a repeat in 1 year per notes from GI She will also need ultrasound or CT every 6 months as she is at an increased risk of hepatocellular carcinoma, ultrasound ordered by GI  T2DM Last A1c on 9/9 was 9.3 Current regimen: Farxiga 10 mg daily, NovoLog 30 units 3 times daily, Lantus 50 units nightly which was increased on 9/9 from 45 units nightly CBG*** She has been referred to endocrinology, will be seeing [facility]***   PERTINENT  PMH / PSH: ***  OBJECTIVE:   LMP 10/10/2012   ***  ASSESSMENT/PLAN:   No problem-specific Assessment & Plan notes found for this encounter.     Cleophas Dunker, [place]

## 2019-12-25 ENCOUNTER — Ambulatory Visit: Payer: Medicare Other | Admitting: Internal Medicine

## 2019-12-28 ENCOUNTER — Telehealth: Payer: Self-pay

## 2019-12-28 ENCOUNTER — Telehealth: Payer: Medicare Other

## 2019-12-28 NOTE — Telephone Encounter (Signed)
°  Care Management   Outreach Note  12/28/2019 Name: Deborah [name] MRN: [id number] DOB: 1965-12-04  Referred by: Cleophas Dunker, DO Reason for referral : Appointment (DM II)   An unsuccessful telephone outreach was attempted today. The patient was referred to the case management team for assistance with care management and care coordination.   Follow Up Plan: A HIPAA compliant phone message was left for the patient providing contact information and requesting a return call.  The care management team will reach out to the patient again over the next 7-14 days.   Lazaro Arms RN, BSN, [facility] Care Management Coordinator [place] Phone: [phone] Fax: [phone]

## 2019-12-31 ENCOUNTER — Telehealth: Payer: Self-pay | Admitting: *Deleted

## 2019-12-31 NOTE — Chronic Care Management (AMB) (Signed)
  Care Management   Note  12/31/2019 Name: Deborah [name] MRN: [id number] DOB: [birth year]  Deborah [name] is a 54 y.o. year old female who is a primary care patient of [place], Bernita Raisin, DO and is actively engaged with the care management team. I reached out to Deborah [name] by phone today to assist with re-scheduling a follow up visit with the RN Case Manager  Follow up plan: Unsuccessful telephone outreach attempt made. A HIPAA compliant phone message was left for the patient providing contact information and requesting a return call.  The care management team will reach out to the patient again over the next 7-10 days.  If patient returns call to provider office, please advise to call [place] at [phone].  [place] Management

## 2020-01-01 ENCOUNTER — Telehealth: Payer: Self-pay

## 2020-01-01 NOTE — Telephone Encounter (Addendum)
Patient calls nurse line requesting Colchicine. Patient reports she has a gout flare in both feet and can hardly walk. I advised patient this medication is not on her current med list and she may have to make an apt. Patient wanted me to check with PCP first. Please send to [place].

## 2020-01-01 NOTE — Telephone Encounter (Signed)
Yes, she will need appointment.  First available with any provider is fine.

## 2020-01-02 NOTE — Telephone Encounter (Signed)
Patient calls nurse line demanding to speak to PCP nurse. I advised her this is the general nurse line, she then became agitated that she has to come in to be seen for gout medication. Patient stated, "I told yall I cant walk, how am I going to come in there?" Patient proceeded to demand for PCP to call her.

## 2020-01-02 NOTE — Telephone Encounter (Signed)
Call patient advised that since Deborah [name] had never been prescribed this medication at this office or been seen for this problem while here, Deborah [name] would need an appointment.  Deborah [name] understands this, but due to her pain Deborah [name] is unable to walk and get here for an appointment.  Personally do not have any openings this week, but would be okay to see her for a virtual visit.  [place] office, can we call and try to get her scheduled for first available appointment, and then would you be able to ask the access to care provider if they would be willing to see her virtually for this reason?

## 2020-01-02 NOTE — Telephone Encounter (Signed)
LVM asking pt to call and schedule an appt to be see for gout. Told pt she can see the first available provider,she does not need to wait to see Dr. [name]. Ottis Stain, CMA

## 2020-01-04 ENCOUNTER — Encounter: Payer: Self-pay | Admitting: Family Medicine

## 2020-01-04 ENCOUNTER — Telehealth (INDEPENDENT_AMBULATORY_CARE_PROVIDER_SITE_OTHER): Payer: Medicare Other | Admitting: Family Medicine

## 2020-01-04 ENCOUNTER — Other Ambulatory Visit: Payer: Self-pay

## 2020-01-04 DIAGNOSIS — B3731 Acute candidiasis of vulva and vagina: Secondary | ICD-10-CM | POA: Insufficient documentation

## 2020-01-04 DIAGNOSIS — B373 Candidiasis of vulva and vagina: Secondary | ICD-10-CM | POA: Diagnosis not present

## 2020-01-04 DIAGNOSIS — M1A9XX0 Chronic gout, unspecified, without tophus (tophi): Secondary | ICD-10-CM

## 2020-01-04 MED ORDER — FLUCONAZOLE 150 MG PO TABS: 150.0000 mg | ORAL_TABLET | Freq: Once | ORAL | 0 refills | Status: AC

## 2020-01-04 MED ORDER — COLCHICINE 0.6 MG PO TABS: ORAL_TABLET | ORAL | 0 refills | Status: DC

## 2020-01-04 NOTE — Assessment & Plan Note (Signed)
Patient reports having gout flare at this time.  Reports that she usually gets colchicine which helps with the gout flare.  Has been taking her allopurinol and has not missed any doses.  Pain in the bottom of her feet which is where her typical gout flares occur. -Colchicine 1.2 mg x 1 followed by 0.6 mg 1 hour later prescribed -Discussed further evaluation precautions

## 2020-01-04 NOTE — Assessment & Plan Note (Signed)
Patient reports recent antibiotic use and that she chronically has issues with yeast infections after antibiotic use.  She reports certainty that this is a yeast infection and would like treatment for it.  Patient is getting ready to be treated for gout and given both colchicine and Diflucan are both Cyp3A4 inhibitor she should not be taking them at the same time. -Diflucan 150 mg once prescribed to patient, she will take this 48 hours after finishing her colchicine -If symptoms do not resolve patient will need an office visit for wet prep

## 2020-01-04 NOTE — Progress Notes (Signed)
[facility] Telemedicine Visit  Patient consented to have virtual visit and was identified by name and date of birth. Method of visit: Video was attempted, but technology challenges prevented patient from using video, so visit was conducted via telephone.  Encounter participants: Patient: Deborah [name] - located at [facility] Provider: Gifford Shave - located riding in the passenger seat of car  Chief Complaint: Gout flare and yeast infection  HPI:  Gout flare Patient reports that she has longstanding history of gout for which she takes allopurinol for.  When she has flares they typically occur in the middle of her feet become red hot and painful.  She reports that she is having a flare at this time and would like treatment.  She reportedly usually gets colchicine which helps the gout flare resolve.  Yeast infection Patient recently diagnosed with abscess and was given antibiotics.  She finished the antibiotics a week or 2 ago and since then has been having an increased amount of white vaginal discharge which she reports is typical for when she gets yeast infections after being on antibiotics.  ROS: per HPI  Pertinent PMHx: History of gout  Exam:  Wt (!) 344 lb (156 kg)   LMP 10/10/2012   BMI 55.52 kg/m   Respiratory: Speaking in clear sentences, no acute distress  Assessment/Plan:  Gout Patient reports having gout flare at this time.  Reports that she usually gets colchicine which helps with the gout flare.  Has been taking her allopurinol and has not missed any doses.  Pain in the bottom of her feet which is where her typical gout flares occur. -Colchicine 1.2 mg x 1 followed by 0.6 mg 1 hour later prescribed -Discussed further evaluation precautions  Vaginal yeast infection Patient reports recent antibiotic use and that she chronically has issues with yeast infections after antibiotic use.  She reports certainty that this is a yeast infection and  would like treatment for it.  Patient is getting ready to be treated for gout and given both colchicine and Diflucan are both Cyp3A4 inhibitor she should not be taking them at the same time. -Diflucan 150 mg once prescribed to patient, she will take this 48 hours after finishing her colchicine -If symptoms do not resolve patient will need an office visit for wet prep     Time spent during visit with patient: 20 minutes

## 2020-01-07 ENCOUNTER — Inpatient Hospital Stay (HOSPITAL_COMMUNITY)
Admission: EM | Admit: 2020-01-07 | Discharge: 2020-01-14 | DRG: 074 | Disposition: A | Discharge status: Home / Self Care | Payer: Medicare Other | Attending: Family Medicine | Admitting: Family Medicine

## 2020-01-07 ENCOUNTER — Other Ambulatory Visit: Payer: Self-pay

## 2020-01-07 ENCOUNTER — Encounter (HOSPITAL_COMMUNITY): Payer: Self-pay

## 2020-01-07 DIAGNOSIS — Z9981 Dependence on supplemental oxygen: Secondary | ICD-10-CM

## 2020-01-07 DIAGNOSIS — R079 Chest pain, unspecified: Secondary | ICD-10-CM

## 2020-01-07 DIAGNOSIS — Z20822 Contact with and (suspected) exposure to covid-19: Secondary | ICD-10-CM | POA: Diagnosis present

## 2020-01-07 DIAGNOSIS — R0789 Other chest pain: Secondary | ICD-10-CM | POA: Diagnosis present

## 2020-01-07 DIAGNOSIS — I130 Hypertensive heart and chronic kidney disease with heart failure and stage 1 through stage 4 chronic kidney disease, or unspecified chronic kidney disease: Secondary | ICD-10-CM | POA: Diagnosis present

## 2020-01-07 DIAGNOSIS — N179 Acute kidney failure, unspecified: Secondary | ICD-10-CM | POA: Diagnosis not present

## 2020-01-07 DIAGNOSIS — K529 Noninfective gastroenteritis and colitis, unspecified: Secondary | ICD-10-CM | POA: Diagnosis present

## 2020-01-07 DIAGNOSIS — R1084 Generalized abdominal pain: Secondary | ICD-10-CM | POA: Diagnosis not present

## 2020-01-07 DIAGNOSIS — Z8711 Personal history of peptic ulcer disease: Secondary | ICD-10-CM

## 2020-01-07 DIAGNOSIS — Z886 Allergy status to analgesic agent status: Secondary | ICD-10-CM

## 2020-01-07 DIAGNOSIS — F32A Depression, unspecified: Secondary | ICD-10-CM | POA: Diagnosis present

## 2020-01-07 DIAGNOSIS — Z79890 Hormone replacement therapy (postmenopausal): Secondary | ICD-10-CM

## 2020-01-07 DIAGNOSIS — Z794 Long term (current) use of insulin: Secondary | ICD-10-CM

## 2020-01-07 DIAGNOSIS — Z888 Allergy status to other drugs, medicaments and biological substances status: Secondary | ICD-10-CM

## 2020-01-07 DIAGNOSIS — Z8673 Personal history of transient ischemic attack (TIA), and cerebral infarction without residual deficits: Secondary | ICD-10-CM

## 2020-01-07 DIAGNOSIS — M797 Fibromyalgia: Secondary | ICD-10-CM | POA: Diagnosis present

## 2020-01-07 DIAGNOSIS — Z7901 Long term (current) use of anticoagulants: Secondary | ICD-10-CM

## 2020-01-07 DIAGNOSIS — Z91041 Radiographic dye allergy status: Secondary | ICD-10-CM

## 2020-01-07 DIAGNOSIS — E1143 Type 2 diabetes mellitus with diabetic autonomic (poly)neuropathy: Principal | ICD-10-CM | POA: Diagnosis present

## 2020-01-07 DIAGNOSIS — Z8249 Family history of ischemic heart disease and other diseases of the circulatory system: Secondary | ICD-10-CM

## 2020-01-07 DIAGNOSIS — Z87892 Personal history of anaphylaxis: Secondary | ICD-10-CM

## 2020-01-07 DIAGNOSIS — R112 Nausea with vomiting, unspecified: Secondary | ICD-10-CM

## 2020-01-07 DIAGNOSIS — Z833 Family history of diabetes mellitus: Secondary | ICD-10-CM

## 2020-01-07 DIAGNOSIS — M4807 Spinal stenosis, lumbosacral region: Secondary | ICD-10-CM | POA: Diagnosis present

## 2020-01-07 DIAGNOSIS — E785 Hyperlipidemia, unspecified: Secondary | ICD-10-CM | POA: Diagnosis present

## 2020-01-07 DIAGNOSIS — K3184 Gastroparesis: Secondary | ICD-10-CM | POA: Diagnosis present

## 2020-01-07 DIAGNOSIS — I5022 Chronic systolic (congestive) heart failure: Secondary | ICD-10-CM | POA: Diagnosis present

## 2020-01-07 DIAGNOSIS — C7A010 Malignant carcinoid tumor of the duodenum: Secondary | ICD-10-CM | POA: Diagnosis present

## 2020-01-07 DIAGNOSIS — E1165 Type 2 diabetes mellitus with hyperglycemia: Secondary | ICD-10-CM

## 2020-01-07 DIAGNOSIS — I428 Other cardiomyopathies: Secondary | ICD-10-CM | POA: Diagnosis present

## 2020-01-07 DIAGNOSIS — J9611 Chronic respiratory failure with hypoxia: Secondary | ICD-10-CM | POA: Diagnosis present

## 2020-01-07 DIAGNOSIS — M109 Gout, unspecified: Secondary | ICD-10-CM | POA: Diagnosis present

## 2020-01-07 DIAGNOSIS — E1122 Type 2 diabetes mellitus with diabetic chronic kidney disease: Secondary | ICD-10-CM | POA: Diagnosis present

## 2020-01-07 DIAGNOSIS — N1832 Chronic kidney disease, stage 3b: Secondary | ICD-10-CM | POA: Diagnosis present

## 2020-01-07 DIAGNOSIS — R109 Unspecified abdominal pain: Secondary | ICD-10-CM | POA: Diagnosis present

## 2020-01-07 DIAGNOSIS — Z8674 Personal history of sudden cardiac arrest: Secondary | ICD-10-CM

## 2020-01-07 DIAGNOSIS — I480 Paroxysmal atrial fibrillation: Secondary | ICD-10-CM | POA: Diagnosis present

## 2020-01-07 DIAGNOSIS — K219 Gastro-esophageal reflux disease without esophagitis: Secondary | ICD-10-CM | POA: Diagnosis present

## 2020-01-07 DIAGNOSIS — F419 Anxiety disorder, unspecified: Secondary | ICD-10-CM | POA: Diagnosis present

## 2020-01-07 DIAGNOSIS — E876 Hypokalemia: Secondary | ICD-10-CM

## 2020-01-07 DIAGNOSIS — Z79899 Other long term (current) drug therapy: Secondary | ICD-10-CM

## 2020-01-07 DIAGNOSIS — Z880 Allergy status to penicillin: Secondary | ICD-10-CM

## 2020-01-07 DIAGNOSIS — Z885 Allergy status to narcotic agent status: Secondary | ICD-10-CM

## 2020-01-07 LAB — CBC
HCT: 29.8 % — ABNORMAL LOW (ref 36.0–46.0)
Hemoglobin: 8.8 g/dL — ABNORMAL LOW (ref 12.0–15.0)
MCH: 27.2 pg (ref 26.0–34.0)
MCHC: 29.5 g/dL — ABNORMAL LOW (ref 30.0–36.0)
MCV: 92.3 fL (ref 80.0–100.0)
Platelets: 552 10*3/uL — ABNORMAL HIGH (ref 150–400)
RBC: 3.23 MIL/uL — ABNORMAL LOW (ref 3.87–5.11)
RDW: 15.1 % (ref 11.5–15.5)
WBC: 12.1 10*3/uL — ABNORMAL HIGH (ref 4.0–10.5)
nRBC: 0 % (ref 0.0–0.2)

## 2020-01-07 LAB — COMPREHENSIVE METABOLIC PANEL
ALT: 14 U/L (ref 0–44)
AST: 18 U/L (ref 15–41)
Albumin: 2.7 g/dL — ABNORMAL LOW (ref 3.5–5.0)
Alkaline Phosphatase: 99 U/L (ref 38–126)
Anion gap: 13 (ref 5–15)
BUN: 12 mg/dL (ref 6–20)
CO2: 21 mmol/L — ABNORMAL LOW (ref 22–32)
Calcium: 8.3 mg/dL — ABNORMAL LOW (ref 8.9–10.3)
Chloride: 105 mmol/L (ref 98–111)
Creatinine, Ser: 1.22 mg/dL — ABNORMAL HIGH (ref 0.44–1.00)
GFR, Estimated: 52 mL/min — ABNORMAL LOW (ref >60)
Glucose, Bld: 219 mg/dL — ABNORMAL HIGH (ref 70–99)
Potassium: 3.4 mmol/L — ABNORMAL LOW (ref 3.5–5.1)
Sodium: 139 mmol/L (ref 135–145)
Total Bilirubin: 0.7 mg/dL (ref 0.3–1.2)
Total Protein: 7.3 g/dL (ref 6.5–8.1)

## 2020-01-07 LAB — LIPASE, BLOOD: Lipase: 22 U/L (ref 11–51)

## 2020-01-07 LAB — TROPONIN I (HIGH SENSITIVITY)
Troponin I (High Sensitivity): 33 ng/L — ABNORMAL HIGH (ref <18)
Troponin I (High Sensitivity): 33 ng/L — ABNORMAL HIGH (ref <18)

## 2020-01-07 IMAGING — CT CT ABD-PELV W/ CM
2 of 5 series · 16 of 46 positions shown, 18 images · IV contrast (ISOVUE)
Comparison: Chest CT 04/20/2016

CLINICAL DATA: 52-year-old with abdominal pain.

EXAM:
CT ABDOMEN AND PELVIS WITH CONTRAST
TECHNIQUE: Multidetector CT imaging of the abdomen and pelvis was performed
using the standard protocol following bolus administration of
intravenous contrast.
CONTRAST:  100mL [id number] IOPAMIDOL (D34JF8-[id number]) INJECTION 61%

[Series 2: axial st · axial · 0.78mm/px · z∈[-133,+282]mm · 13 of 97 slices shown, 15 images]
[im 7/97  soft-tissue]
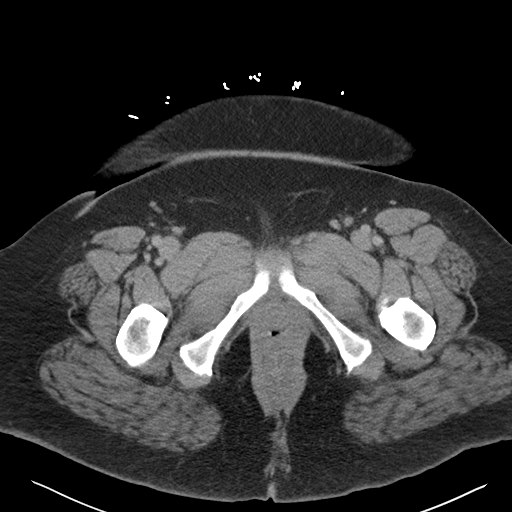
[im 7/97  bone]
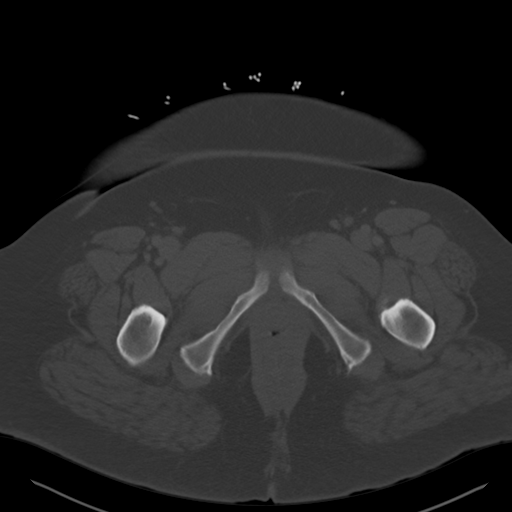
[im 13/97  soft-tissue]
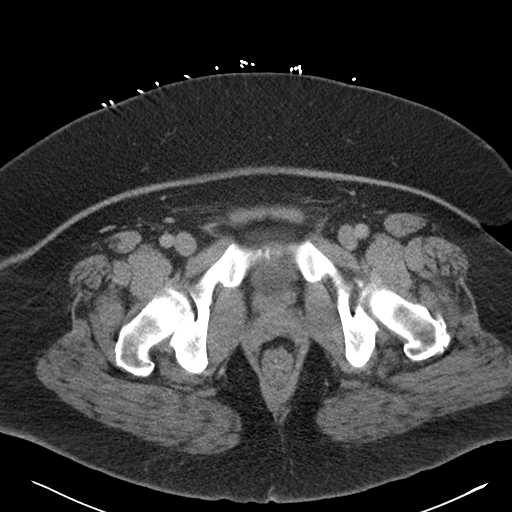
[im 20/97  soft-tissue]
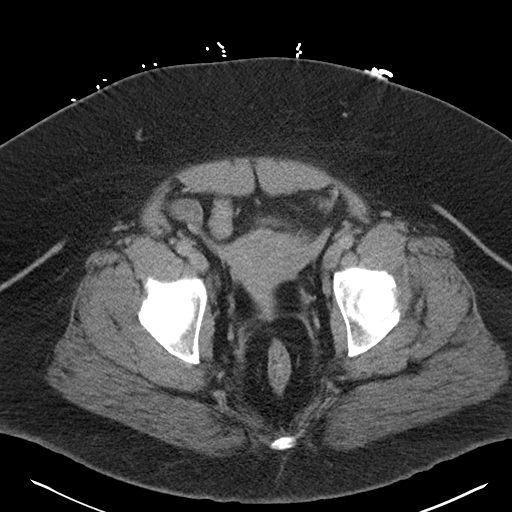
[im 26/97  soft-tissue]
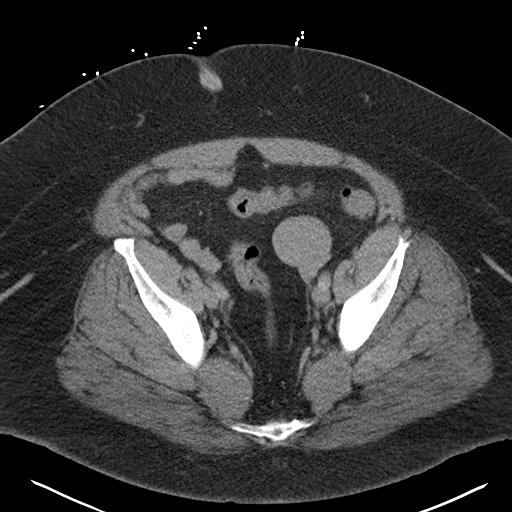
[im 33/97  soft-tissue]
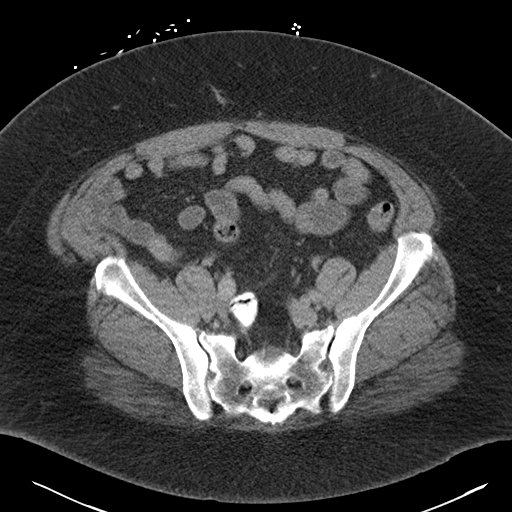
[im 39/97  soft-tissue]
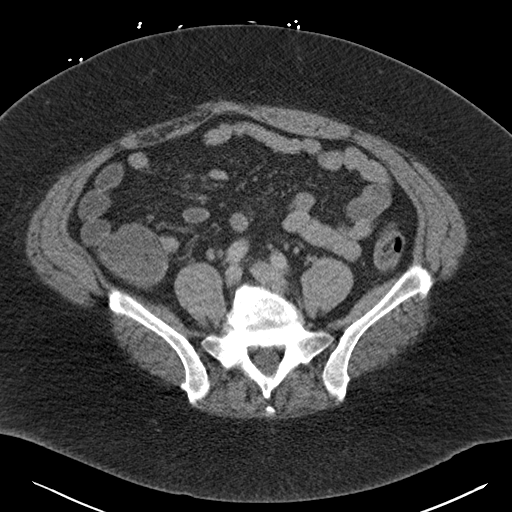
[im 52/97  soft-tissue]
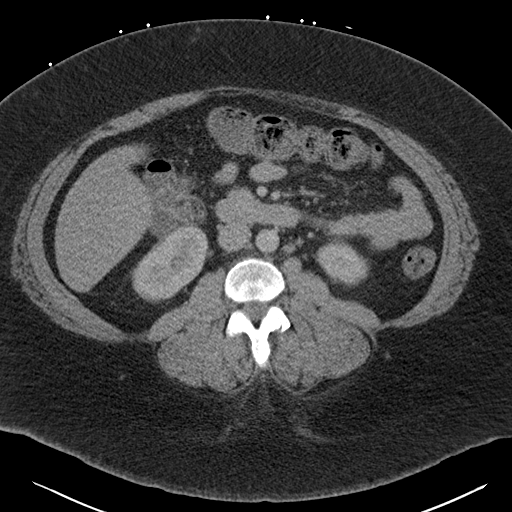
[im 58/97  soft-tissue]
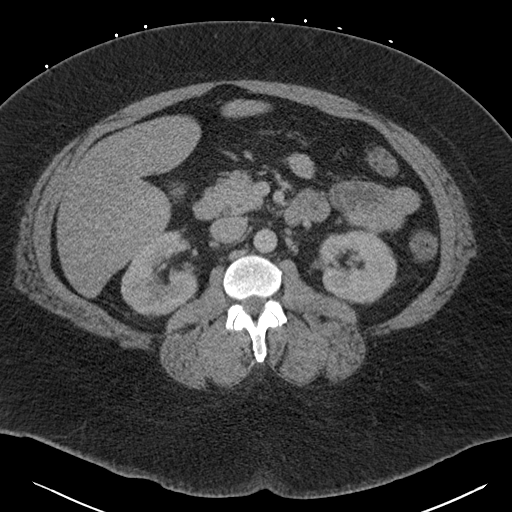
[im 65/97  soft-tissue]
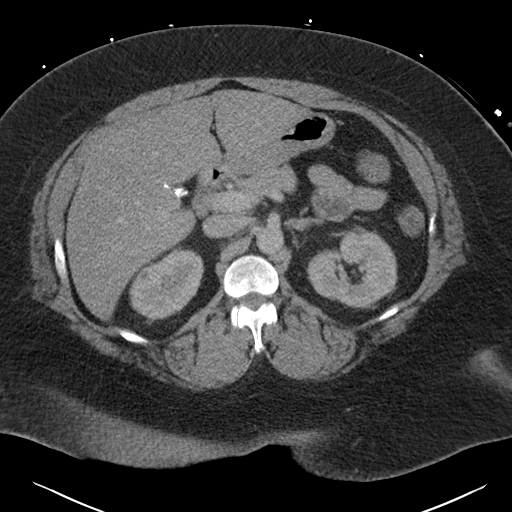
[im 65/97  bone]
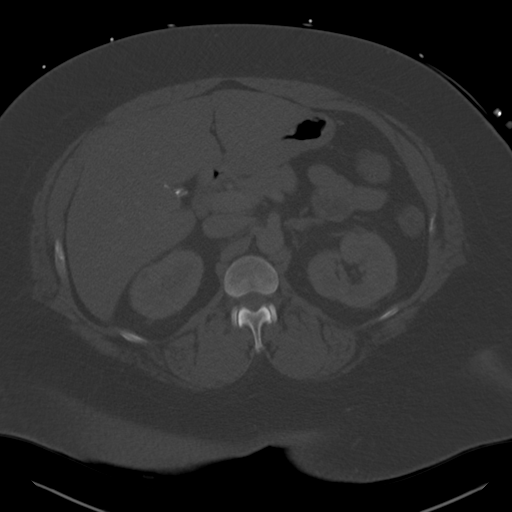
[im 71/97  soft-tissue]
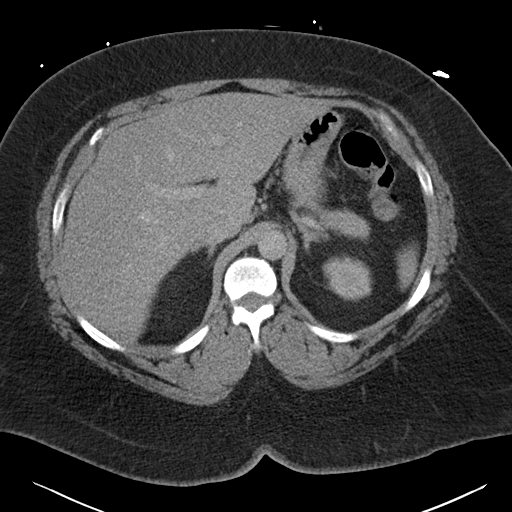
[im 77/97  soft-tissue]
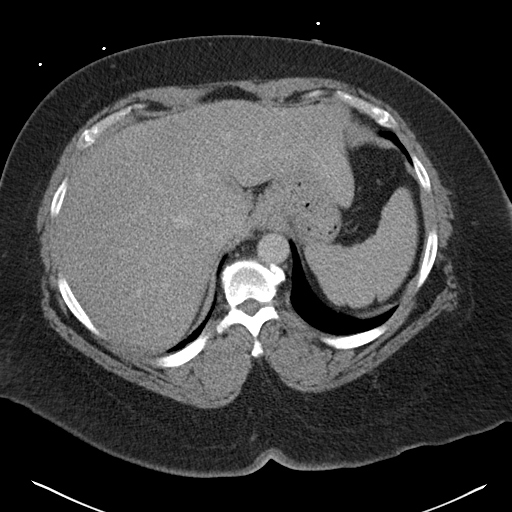
[im 84/97  soft-tissue]
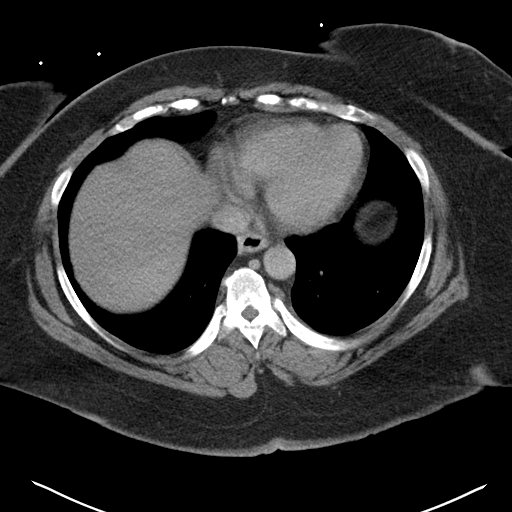
[im 90/97  soft-tissue]
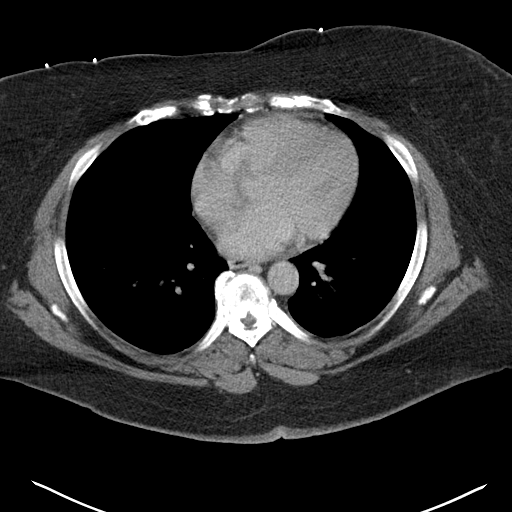

[Series 4: coronal st · coronal · 0.80mm/px · 3 of 111 slices shown]
[im 37/111  soft-tissue]
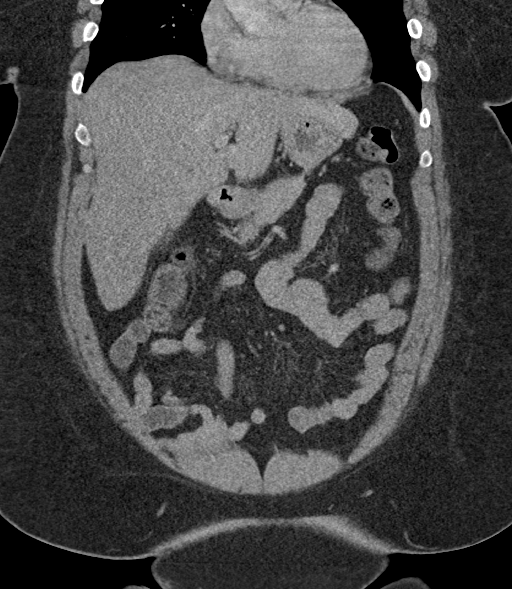
[im 49/111  soft-tissue]
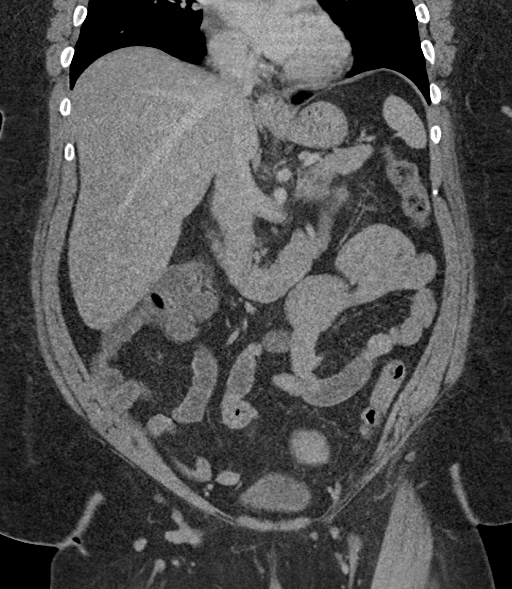
[im 62/111  soft-tissue]
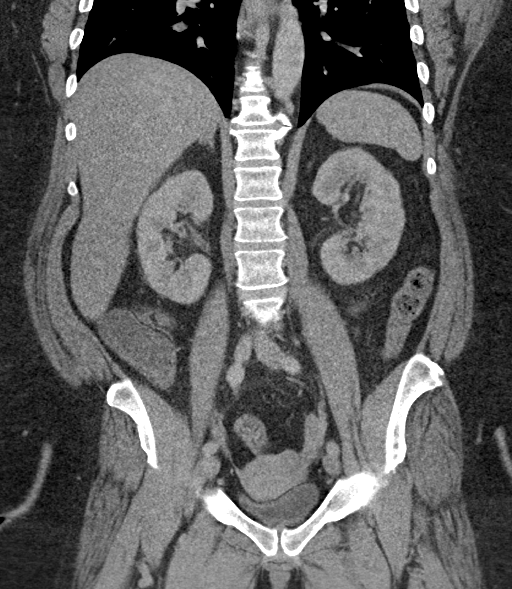

[16 of 46 positions shown; findings below may reference images not displayed]

FINDINGS: Lower chest: Lung bases are clear.

Hepatobiliary: Cholecystectomy. Normal appearance of the liver.
Portal venous system is patent. No suspicious liver lesions. No
biliary dilatation.

Pancreas: Unremarkable. No pancreatic ductal dilatation or
surrounding inflammatory changes.

Spleen: Normal in size without focal abnormality.

Adrenals/Urinary Tract: Normal adrenals. Urinary bladder is
unremarkable. No hydronephrosis and no suspicious renal lesions.
However, there is no evidence for contrast excretion on the 2 minute
delayed renal images.

Stomach/Bowel: Normal appearance of the stomach and duodenum. Normal
appearance of small bowel without obstruction. No acute abnormality
to the colon. There is a normal appendix.

Vascular/Lymphatic: Aortic calcifications without aneurysm. No
significant lymph node enlargement in the abdomen or pelvis.

Reproductive: There is a rounded solid structure in the left adnexal
region that measures 4.3 x 4.6 x 5.2 cm. Structure has similar
density to the adjacent uterus on this post contrast examination.
Normal appearance of the right ovary/adnexa region.

Other: No free fluid.  Negative for free air.

Musculoskeletal: Degenerative facet disease in lower lumbar spine.
Disc space narrowing and large anterior bridging osteophytes at
L5-S1.
IMPRESSION: 1. Indeterminate 5.2 cm solid structure in the left adnexa region.
Differential diagnosis includes a left ovarian/adnexal mass versus a
pedunculated uterine fibroid. Recommend further characterization
with a pelvic ultrasound.
2. No excretion of iodinated contrast on the 2 minute delayed renal
images. Findings can be associated with renal dysfunction and
recommend follow-up metabolic panel to assess kidney function.

## 2020-01-07 NOTE — ED Notes (Signed)
Pt did not respond when called for vitals check X2

## 2020-01-07 NOTE — ED Triage Notes (Signed)
Pt bib ems from home for c.o abd pain, chest pain, and sob since 530 this afternoon. Pt 100% on room air but requesting oxygen, 2L  applied for comfort.

## 2020-01-08 ENCOUNTER — Emergency Department (HOSPITAL_COMMUNITY): Payer: Medicare Other

## 2020-01-08 ENCOUNTER — Other Ambulatory Visit: Payer: Self-pay

## 2020-01-08 DIAGNOSIS — R079 Chest pain, unspecified: Secondary | ICD-10-CM | POA: Diagnosis not present

## 2020-01-08 DIAGNOSIS — R1084 Generalized abdominal pain: Secondary | ICD-10-CM

## 2020-01-08 DIAGNOSIS — R112 Nausea with vomiting, unspecified: Secondary | ICD-10-CM

## 2020-01-08 LAB — BRAIN NATRIURETIC PEPTIDE: B Natriuretic Peptide: 250.9 pg/mL — ABNORMAL HIGH (ref 0.0–100.0)

## 2020-01-08 LAB — CBG MONITORING, ED
Glucose-Capillary: 288 mg/dL — ABNORMAL HIGH (ref 70–99)
Glucose-Capillary: 239 mg/dL — ABNORMAL HIGH (ref 70–99)

## 2020-01-08 LAB — COMPREHENSIVE METABOLIC PANEL
ALT: 14 U/L (ref 0–44)
AST: 17 U/L (ref 15–41)
Albumin: 2.6 g/dL — ABNORMAL LOW (ref 3.5–5.0)
Alkaline Phosphatase: 96 U/L (ref 38–126)
Anion gap: 12 (ref 5–15)
BUN: 15 mg/dL (ref 6–20)
CO2: 23 mmol/L (ref 22–32)
Calcium: 8.2 mg/dL — ABNORMAL LOW (ref 8.9–10.3)
Chloride: 103 mmol/L (ref 98–111)
Creatinine, Ser: 1.46 mg/dL — ABNORMAL HIGH (ref 0.44–1.00)
GFR, Estimated: 42 mL/min — ABNORMAL LOW (ref >60)
Glucose, Bld: 322 mg/dL — ABNORMAL HIGH (ref 70–99)
Potassium: 3.5 mmol/L (ref 3.5–5.1)
Sodium: 138 mmol/L (ref 135–145)
Total Bilirubin: 0.5 mg/dL (ref 0.3–1.2)
Total Protein: 7.2 g/dL (ref 6.5–8.1)

## 2020-01-08 LAB — CBC
HCT: 29 % — ABNORMAL LOW (ref 36.0–46.0)
Hemoglobin: 8.6 g/dL — ABNORMAL LOW (ref 12.0–15.0)
MCH: 27.3 pg (ref 26.0–34.0)
MCHC: 29.7 g/dL — ABNORMAL LOW (ref 30.0–36.0)
MCV: 92.1 fL (ref 80.0–100.0)
Platelets: 554 10*3/uL — ABNORMAL HIGH (ref 150–400)
RBC: 3.15 MIL/uL — ABNORMAL LOW (ref 3.87–5.11)
RDW: 15.7 % — ABNORMAL HIGH (ref 11.5–15.5)
WBC: 12.8 10*3/uL — ABNORMAL HIGH (ref 4.0–10.5)
nRBC: 0 % (ref 0.0–0.2)

## 2020-01-08 LAB — HEMOGLOBIN A1C
Hgb A1c MFr Bld: 8.2 % — ABNORMAL HIGH (ref 4.8–5.6)
Mean Plasma Glucose: 188.64 mg/dL

## 2020-01-08 LAB — MAGNESIUM: Magnesium: 1.2 mg/dL — ABNORMAL LOW (ref 1.7–2.4)

## 2020-01-08 LAB — RESPIRATORY PANEL BY RT PCR (FLU A&B, COVID)
Influenza A by PCR: NEGATIVE
Influenza B by PCR: NEGATIVE
SARS Coronavirus 2 by RT PCR: NEGATIVE

## 2020-01-08 LAB — LIPASE, BLOOD: Lipase: 22 U/L (ref 11–51)

## 2020-01-08 IMAGING — MR MR PELVIS W/O CM
4 of 6 series · 18 of 48 positions shown · non-contrast
Comparison: Pelvic CT 11/28/2017.  Pelvic ultrasound 11/29/2017.

CLINICAL DATA: Uterine mass. Renal transplant dysfunction. History
of diabetes and iodinated contrast allergy.

EXAM:
MRI PELVIS WITHOUT CONTRAST
TECHNIQUE: Multiplanar multisequence MR imaging of the pelvis was performed. No
intravenous contrast was administered.

[Series 4: T2 · coronal · 5.0mm · 0.49mm/px · 6 of 36 slices shown (1 of 3)]
[im 1/36]
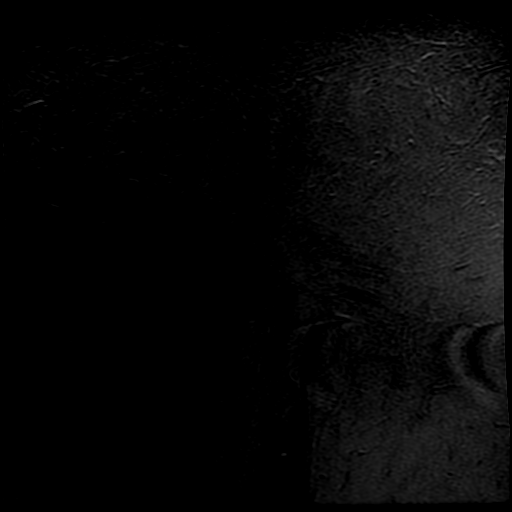
[im 8/36]
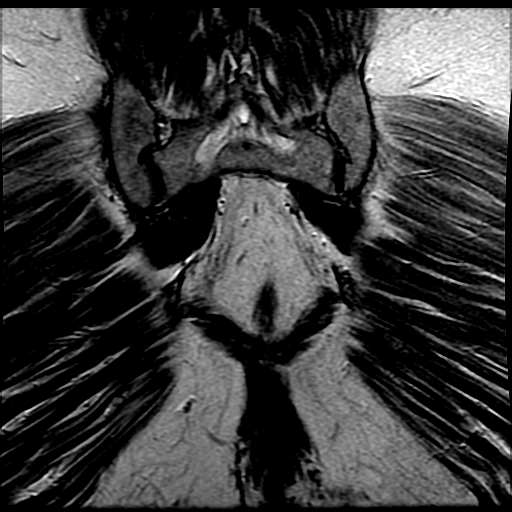
[im 15/36]
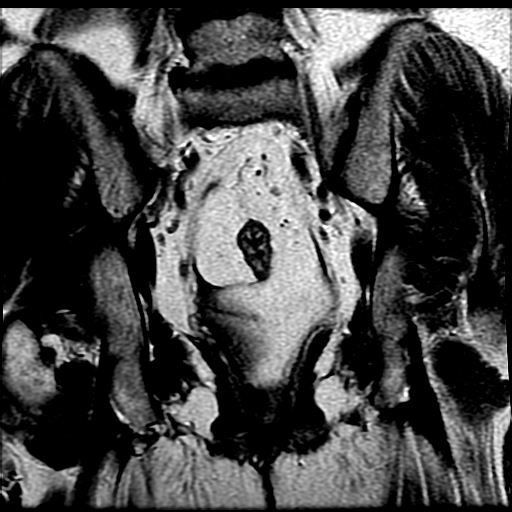
[im 22/36]
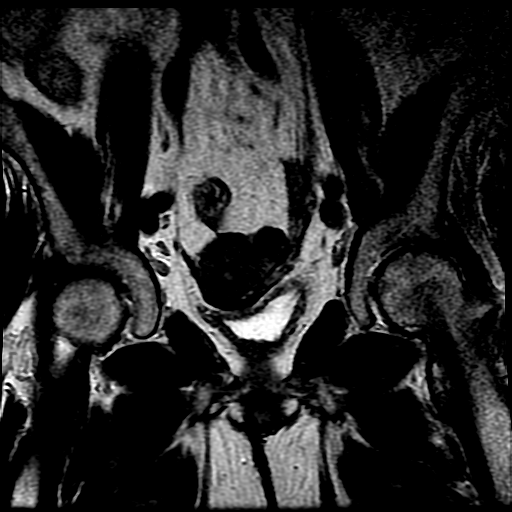
[im 29/36]
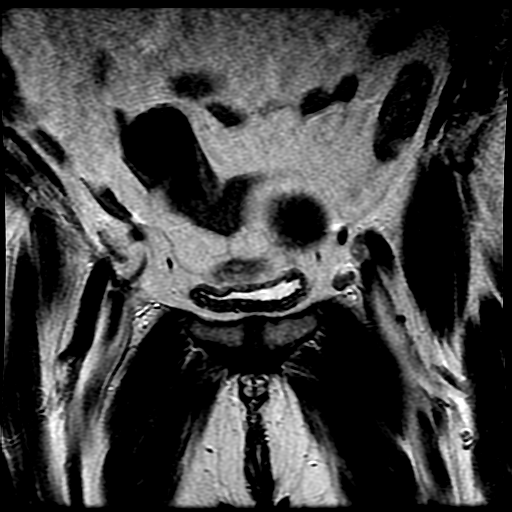
[im 36/36]
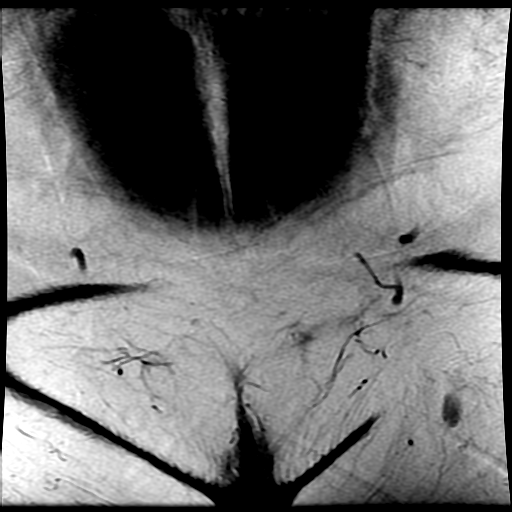

[Series 5: T2 · axial · 5.0mm · 0.49mm/px · z∈[-100,+92]mm · 6 of 40 slices shown (2 of 3)]
[im 1/40]
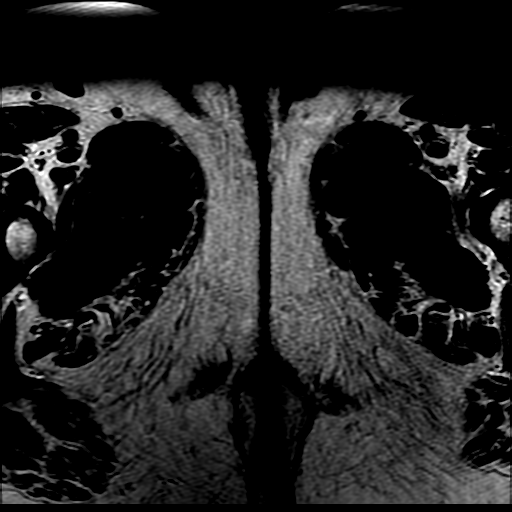
[im 7/40]
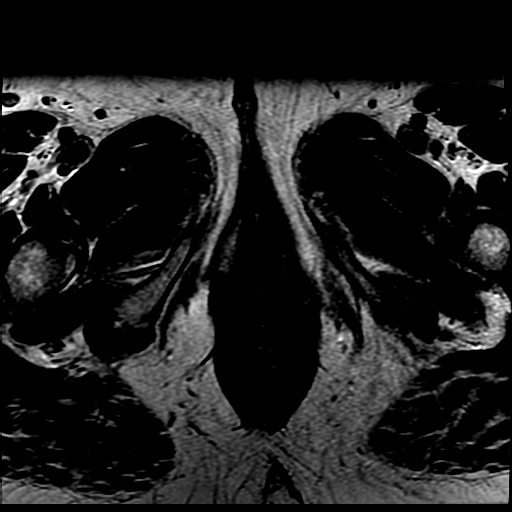
[im 14/40]
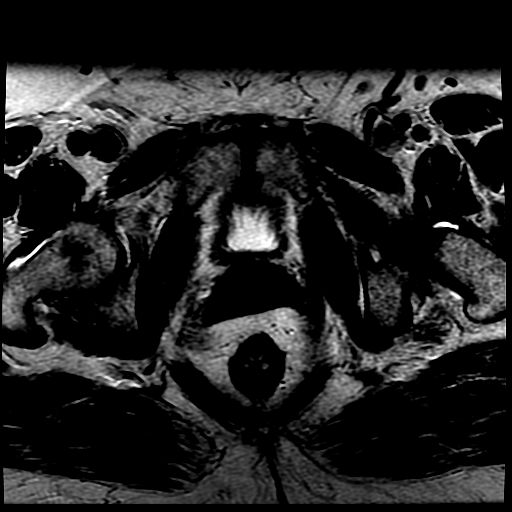
[im 20/40]
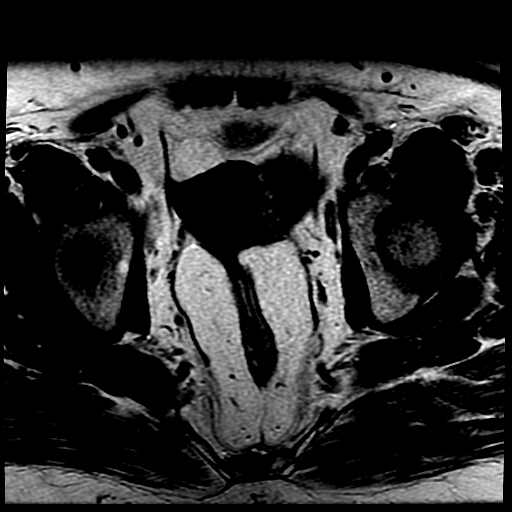
[im 27/40]
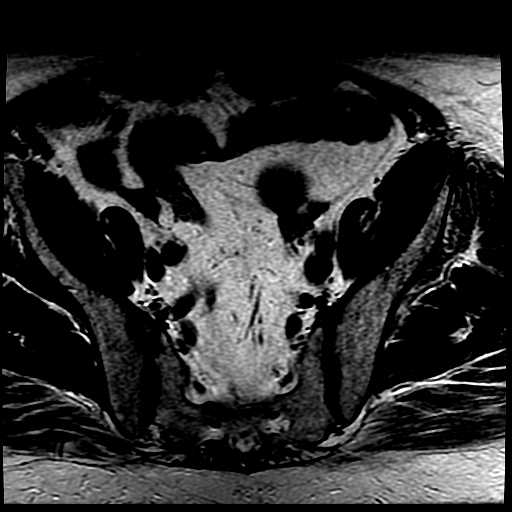
[im 33/40]
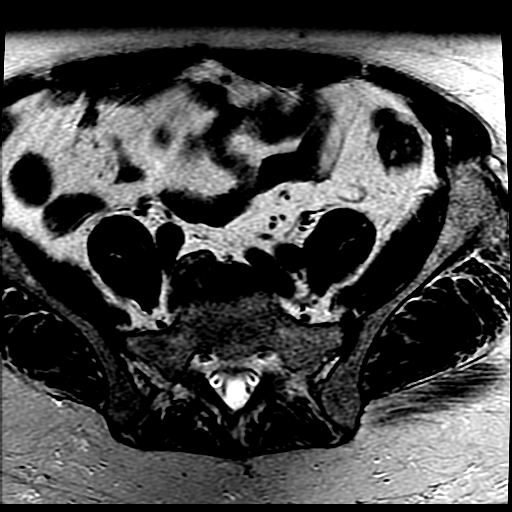

[Series 6: T2 fat-sat · axial · 5.0mm · 0.49mm/px · z∈[-64,+92]mm · 3 of 40 slices shown]
[im 7/40]
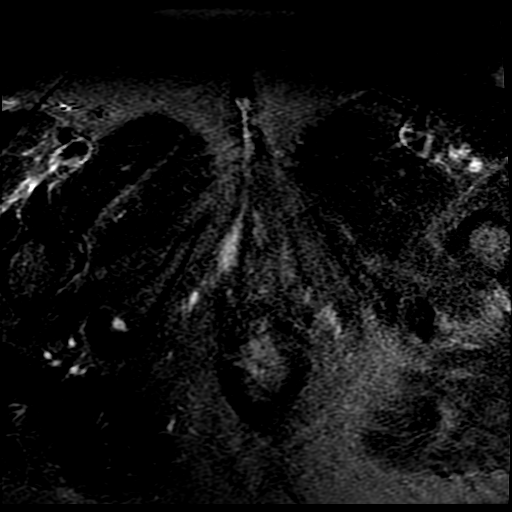
[im 20/40]
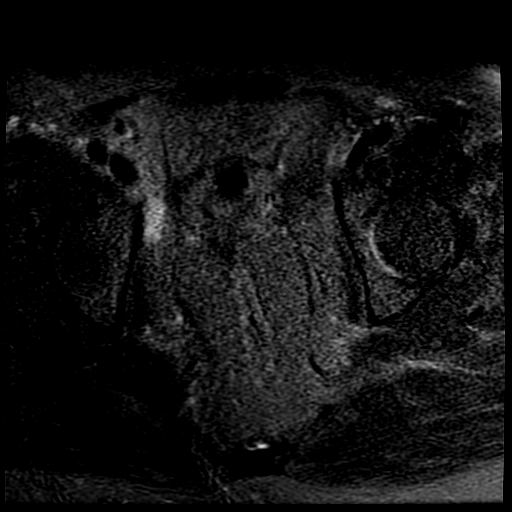
[im 33/40]
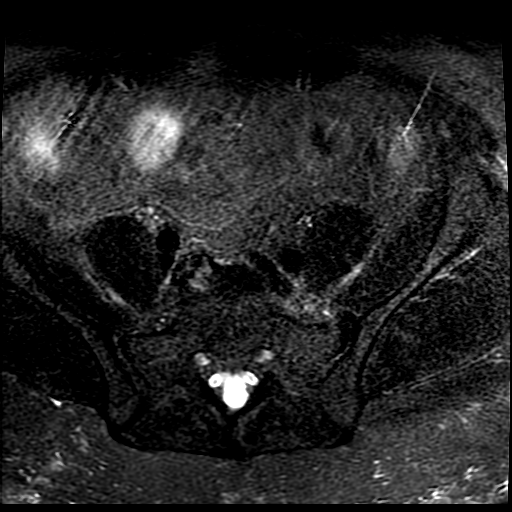

[Series 7: T2 · sagittal · 5.0mm · 0.47mm/px · 3 of 28 slices shown (3 of 3)]
[im 1/28]
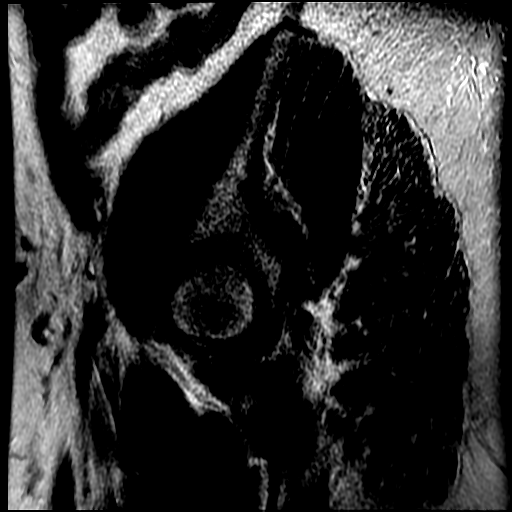
[im 14/28]
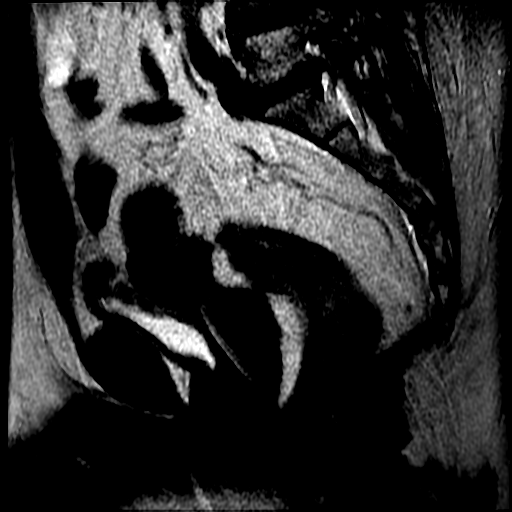
[im 28/28]
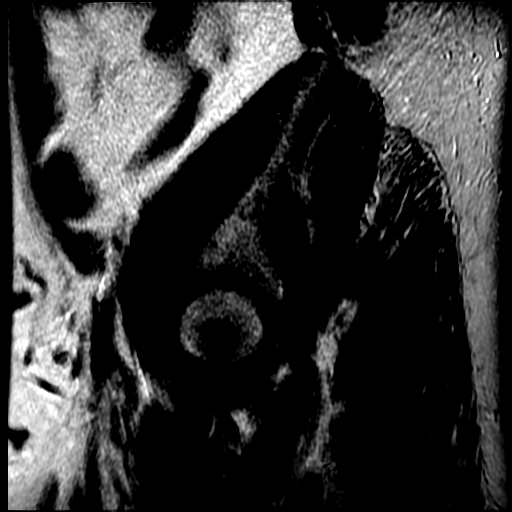

[18 of 48 positions shown; findings below may reference images not displayed]

FINDINGS: Despite efforts by the technologist and patient, mild motion
artifact is present on today's exam and could not be eliminated.
This reduces exam sensitivity and specificity.

Urinary Tract: The visualized distal ureters and bladder appear
unremarkable. There is no evidence of renal transplant on this study
or yesterday CT, and the provided history appears to be in error.

Bowel: No bowel wall thickening, distention or surrounding
inflammation identified within the pelvis.

Vascular/Lymphatic: No enlarged pelvic lymph nodes identified. No
significant vascular findings.

Reproductive:

Uterus: Measures 8.0 x 3.3 x 5.3 cm. There is an intramural fundal
fibroid measuring 15 mm on coronal image [DATE]. The lesion of concern
in the left adnexa is contiguous with the uterus and measures 4.9 x
4.2 x 5.0 cm. This demonstrates low T2 signal and is most consistent
with an exophytic fibroid. There is another adjacent smaller fibroid
measuring 17 mm on coronal image 22.

Endometrium:  Unremarkable.

Cervix/Vagina: Small cervical nabothian cysts. The vagina appears
unremarkable.

Right ovary:  Appears normal.  No mass identified.

Left ovary: Abuts the left adnexal lesion, although appears separate
from it.

Other: No ascites.

Musculoskeletal: No acute or worrisome osseous findings.
IMPRESSION: 1. The lesion of concern in the left adnexa abuts the uterus, has a
broad based connection and low T2 signal, most consistent with an
exophytic fibroid. There are additional smaller intramural uterine
fibroids.
2. No suspicious adnexal findings.

## 2020-01-08 MED ORDER — INSULIN GLARGINE 100 UNIT/ML ~~LOC~~ SOLN
20.0000 [IU] | Freq: Every day | SUBCUTANEOUS | Status: DC
  Administered 2020-01-08 – 2020-01-13 (×6): 20 [IU] via SUBCUTANEOUS
  Filled 2020-01-08 (×6): qty 0.2

## 2020-01-08 MED ORDER — FUROSEMIDE 10 MG/ML IJ SOLN
40.0000 mg | Freq: Two times a day (BID) | INTRAMUSCULAR | Status: DC
  Administered 2020-01-09 (×2): 40 mg via INTRAVENOUS
  Filled 2020-01-08 (×2): qty 4

## 2020-01-08 MED ORDER — PANTOPRAZOLE SODIUM 40 MG PO TBEC
40.0000 mg | DELAYED_RELEASE_TABLET | Freq: Two times a day (BID) | ORAL | Status: DC
  Administered 2020-01-08 – 2020-01-14 (×13): 40 mg via ORAL
  Filled 2020-01-08 (×12): qty 1

## 2020-01-08 MED ORDER — ATORVASTATIN CALCIUM 10 MG PO TABS
10.0000 mg | ORAL_TABLET | Freq: Every day | ORAL | Status: DC
  Administered 2020-01-08 – 2020-01-14 (×7): 10 mg via ORAL
  Filled 2020-01-08 (×7): qty 1

## 2020-01-08 MED ORDER — DULOXETINE HCL 20 MG PO CPEP
40.0000 mg | ORAL_CAPSULE | Freq: Every day | ORAL | Status: DC
  Administered 2020-01-08 – 2020-01-14 (×7): 40 mg via ORAL
  Filled 2020-01-08 (×7): qty 2

## 2020-01-08 MED ORDER — MELATONIN 3 MG PO TABS
6.0000 mg | ORAL_TABLET | Freq: Every day | ORAL | Status: DC
  Administered 2020-01-09 – 2020-01-13 (×6): 6 mg via ORAL
  Filled 2020-01-08 (×8): qty 2

## 2020-01-08 MED ORDER — ISOSORBIDE MONONITRATE ER 60 MG PO TB24
60.0000 mg | ORAL_TABLET | Freq: Every day | ORAL | Status: DC
  Administered 2020-01-08 – 2020-01-14 (×7): 60 mg via ORAL
  Filled 2020-01-08 (×3): qty 1
  Filled 2020-01-08: qty 2
  Filled 2020-01-08 (×3): qty 1

## 2020-01-08 MED ORDER — MAGNESIUM SULFATE 4 GM/100ML IV SOLN
4.0000 g | Freq: Once | INTRAVENOUS | Status: AC
  Administered 2020-01-08: 4 g via INTRAVENOUS
  Filled 2020-01-08: qty 100

## 2020-01-08 MED ORDER — SODIUM CHLORIDE 0.9 % IV SOLN
150.0000 mg | Freq: Two times a day (BID) | INTRAVENOUS | Status: DC
  Administered 2020-01-08: 150 mg via INTRAVENOUS
  Filled 2020-01-08 (×3): qty 3

## 2020-01-08 MED ORDER — HYDROMORPHONE HCL 1 MG/ML IJ SOLN
0.5000 mg | INTRAMUSCULAR | Status: DC | PRN
  Administered 2020-01-08: 0.5 mg via INTRAVENOUS
  Administered 2020-01-08: 1 mg via INTRAVENOUS
  Filled 2020-01-08 (×2): qty 1

## 2020-01-08 MED ORDER — HYDROMORPHONE HCL 1 MG/ML IJ SOLN
0.5000 mg | Freq: Four times a day (QID) | INTRAMUSCULAR | Status: DC | PRN
  Administered 2020-01-09 (×2): 1 mg via INTRAVENOUS
  Filled 2020-01-08 (×2): qty 1

## 2020-01-08 MED ORDER — FUROSEMIDE 10 MG/ML IJ SOLN
80.0000 mg | Freq: Four times a day (QID) | INTRAMUSCULAR | Status: DC
  Administered 2020-01-08: 80 mg via INTRAVENOUS
  Filled 2020-01-08: qty 8

## 2020-01-08 MED ORDER — BUSPIRONE HCL 10 MG PO TABS: 5.0000 mg | ORAL_TABLET | Freq: Two times a day (BID) | ORAL | Status: DC

## 2020-01-08 MED ORDER — SODIUM CHLORIDE 0.9 % IV BOLUS
1000.0000 mL | Freq: Once | INTRAVENOUS | Status: AC
  Administered 2020-01-08: 1000 mL via INTRAVENOUS

## 2020-01-08 MED ORDER — INSULIN ASPART 100 UNIT/ML ~~LOC~~ SOLN
0.0000 [IU] | Freq: Three times a day (TID) | SUBCUTANEOUS | Status: DC
  Administered 2020-01-08: 5 [IU] via SUBCUTANEOUS
  Administered 2020-01-08: 8 [IU] via SUBCUTANEOUS
  Administered 2020-01-09: 5 [IU] via SUBCUTANEOUS
  Administered 2020-01-09: 8 [IU] via SUBCUTANEOUS
  Administered 2020-01-09 – 2020-01-10 (×4): 3 [IU] via SUBCUTANEOUS
  Administered 2020-01-11: 11 [IU] via SUBCUTANEOUS
  Administered 2020-01-11: 3 [IU] via SUBCUTANEOUS
  Administered 2020-01-12: 8 [IU] via SUBCUTANEOUS
  Administered 2020-01-12: 3 [IU] via SUBCUTANEOUS
  Administered 2020-01-12 – 2020-01-13 (×2): 5 [IU] via SUBCUTANEOUS
  Administered 2020-01-13: 3 [IU] via SUBCUTANEOUS
  Administered 2020-01-13 – 2020-01-14 (×2): 8 [IU] via SUBCUTANEOUS
  Administered 2020-01-14: 5 [IU] via SUBCUTANEOUS

## 2020-01-08 MED ORDER — DAPAGLIFLOZIN PROPANEDIOL 10 MG PO TABS
10.0000 mg | ORAL_TABLET | Freq: Every day | ORAL | Status: DC
  Administered 2020-01-08 – 2020-01-14 (×7): 10 mg via ORAL
  Filled 2020-01-08 (×8): qty 1

## 2020-01-08 MED ORDER — ONDANSETRON 4 MG PO TBDP
4.0000 mg | ORAL_TABLET | Freq: Three times a day (TID) | ORAL | Status: DC | PRN
  Administered 2020-01-08: 4 mg via ORAL
  Filled 2020-01-08 (×4): qty 1

## 2020-01-08 MED ORDER — METOPROLOL SUCCINATE ER 25 MG PO TB24
25.0000 mg | ORAL_TABLET | Freq: Every day | ORAL | Status: DC
  Administered 2020-01-08 – 2020-01-14 (×7): 25 mg via ORAL
  Filled 2020-01-08 (×7): qty 1

## 2020-01-08 MED ORDER — HYDRALAZINE HCL 25 MG PO TABS
25.0000 mg | ORAL_TABLET | Freq: Three times a day (TID) | ORAL | Status: DC
  Administered 2020-01-08 – 2020-01-14 (×19): 25 mg via ORAL
  Filled 2020-01-08 (×19): qty 1

## 2020-01-08 MED ORDER — APIXABAN 5 MG PO TABS
5.0000 mg | ORAL_TABLET | Freq: Two times a day (BID) | ORAL | Status: DC
  Administered 2020-01-08 – 2020-01-14 (×13): 5 mg via ORAL
  Filled 2020-01-08 (×13): qty 1

## 2020-01-08 MED ORDER — FUROSEMIDE 10 MG/ML IJ SOLN
80.0000 mg | Freq: Two times a day (BID) | INTRAMUSCULAR | Status: DC
  Administered 2020-01-08: 80 mg via INTRAVENOUS
  Filled 2020-01-08 (×2): qty 8

## 2020-01-08 MED ORDER — HYDROMORPHONE HCL 1 MG/ML IJ SOLN
1.0000 mg | Freq: Once | INTRAMUSCULAR | Status: AC
  Administered 2020-01-08: 1 mg via INTRAVENOUS
  Filled 2020-01-08 (×2): qty 1

## 2020-01-08 NOTE — ED Notes (Signed)
Dinner order placed 

## 2020-01-08 NOTE — ED Provider Notes (Signed)
Signout from Dr. Ron Parker.  54 year old female uncontrolled diabetic history gastroparesis complaining of chest and abdominal pain shortness of breath acutely this morning.  Delta troponin is flat.  Waiting on IV access for pain medication and hydration.  Plan is to reassess after that to see if appropriate for discharge. Physical Exam  BP (!) 182/108 (BP Location: Right Arm)   Pulse (!) 115   Temp 98.8 F (37.1 C) (Oral)   Resp 16   LMP 10/10/2012   SpO2 100%   Physical Exam  ED Course/Procedures     Procedures  MDM  9 AM reassessed patient.  She said she is feeling a little bit better and asking for ice chips.  She said she usually needs to be admitted and then slowly advance her diet over 24 hours.  Have put out a call to family practice team.       Hayden Rasmussen, MD 01/08/20 1757

## 2020-01-08 NOTE — H&P (Addendum)
[facility] Admission History and Physical Service Pager: [phone]  Patient name: Deborah [name] Medical record number: [id number] Date of birth: May 31, 1965 Age: 54 y.o. Gender: female  Primary Care Provider: Cleophas Dunker, DO Consultants: none Code Status: full  Chief Complaint: abdominal pain  Assessment and Plan: Deborah [name] is a 54 y.o. female presenting with 2 days of abdominal pain.  Her previous medical history is significant for A. fib, diabetes type 2, gastroparesis, carcinoid tumor of the duodenum with recent surgery.  Abdominal pain This is stabbing lower quadrant bilateral abdominal pain started 2 days ago and is accompanied by nausea and vomiting.  Per patient report, very similar to previous presentations for DKA and gastroparesis.  Physical exam shows evidence of fluid overload and diffuse abdominal tenderness.  Vitals have been notable for tachycardia and tachypnea.  Initial lab work revealed only a mildly elevated WBC at 12.1 and no other significant abnormalities.  Chest x-ray was notable for pulmonary congestion.  Based on history and presentation, this is most likely due to gastroparesis.  DKA is unlikely based on a blood glucose of 219.  Lipase is within normal limits at 22 and there is no significant abnormalities of liver enzymes this time making gallbladder and liver pathology unlikely.  She reports that she had a upper endoscopy performed 1 month ago but that is unlikely to have caused any new abdominal pathology.  Chart review shows that she has 2 recent CT abdomens for similar presentations with no significant abnormalities.  For now, we will admit for pain control, monitoring and appropriate treatment for gastroparesis. -Admit to [place], attending Dr. Thompson Grayer -Avoiding metoclopramide due to tardive dyskinesia -Erythromycin 3 mix per cake every 8 hours -Zofran for nausea -Dilaudid 0.5-1 mg every 4 hours for  discomfort -Follow-up CMP and CBC today  Chest pain She describes a right-sided sharp chest pain with radiation to the right arm.  Work-up so far has been reassuring with flat trending high-sensitivity troponins at 33.  EKG shows no signs of ischemia.  We will continue to monitor on telemetry for 24 hours before discontinuing. -Monitor telemetry  Congestive heart failure Physical exam is difficult to assess alert she does appear to have some elevated JVD in the right side.  Lower extremity swelling is certainly present but lung sounds are difficult to auscultate.  Chest x-ray does show pulmonary congestion.  She reports that she has been off of her torsemide recently because of taking colchicine for a gout flare.  Kidney function is improved compared to normal. -IV Lasix 80 mg twice daily -Monitor I's and O's and volume status -Monitor creatinine -Consider transition back to p.o. torsemide as she improves clinically  Chronic hypoxic respiratory failure She reports her typical home O2 requirement is 4 L.  She is currently breathing comfortably on 2 L nasal cannula.  We will treat heart failure as above and continue to monitor respiratory status.  CKD Baseline creatinine appears to be about 1.4.  Slightly improved today at 1.2.  Possibly because of her decreased use of torsemide in the past week. -Continue to monitor kidney function  Atrial fibrillation Apixaban 5 mg twice daily at home. -Continue home meds  Diabetes She reports that her typical insulin regimen includes NovoLog 30 mg 3 times a day with meals and Lantus 50 units at night.  -Lantus 20 units nightly for now due to significantly decreased p.o. intake -SSI moderate scale placed -We will likely up titrate her insulin as she  resumes her normal diet  Anemia, chronic Her hemoglobin at baseline ranges from 8-9.  Hemoglobin of 8.8 on admission today.  No bloody stools or melena. -Restart iron supplements once taking  p.o.  Chronic abdominal pain PDMP reviewed and demonstrated regular refills of Dilaudid at home.  She reports that she takes about 1 mg of Dilaudid daily at home depending on her abdominal pain.  She does occasionally go days without using her Dilaudid.  She tries not to take Dilaudid and her Xanax at the same time. -See above for pain control  Fibromyalgia -Continue duloxetine  Anxiety Home medications include BuSpar and Xanax as needed.  She reports that she rarely takes her Xanax and tries to avoid taking it concurrently with her Dilaudid. -Continue BuSpar -Hold Xanax  FEN/GI: N.p.o. for now Prophylaxis: On full dose anticoagulation with apixaban  Disposition: Admitted for observation.  Possible discharge tomorrow or the next day depending on clinical improvement.  History of Present Illness:   Deborah [name] is a 54 y.o. female presenting with 2 days of abdominal pain.  Her previous medical history is significant for A. fib, diabetes type 2, gastroparesis, carcinoid tumor of the duodenum with recent surgery.  She reports that she first started having nausea and abdominal pain 2 days ago.  This abdominal pain is primarily described as a sharp pain in the lower quadrants bilaterally with some radiation up toward the middle of her stomach.  The stomach pain progressed to a sharp right-sided chest pain with some radiation to the right arm.  There is no radiation of this chest pain to her back or jaw.  This pain did seem to be associated with movement slightly alleviated with rest.  She does have nitroglycerin at home but did not take any because she could not find it.  She remained at home through the morning of 11/2 during which time her symptoms progressed.  At the time of presentation of her right arm pain, she decided it was time to be assessed in the emergency room.  She is suspicious that this was similar to her previous episodes of DKA and gastroparesis.  Prior to presentation to  the ED, she did take all of her typical home medicines on the morning of 11/1 which included Dilaudid.  This Dilaudid did not make any appreciable difference.  She reports that she did have abdominal surgery 1 month ago related to her recent diagnosis of carcinoid tumor of the duodenum.  She reports that she did spend about a week in the hospital and [place] prior to returning to [place].  Review Of Systems: Per HPI with the following additions:   Review of Systems  Constitutional: Negative for chills, fever and weight loss.  HENT: Negative for congestion and sore throat.   Respiratory: Positive for cough and shortness of breath. Negative for hemoptysis, sputum production and wheezing.   Cardiovascular: Positive for chest pain. Negative for palpitations and claudication.  Gastrointestinal: Positive for abdominal pain, diarrhea (chronic), nausea and vomiting. Negative for blood in stool.  Genitourinary: Positive for frequency. Negative for dysuria.  Musculoskeletal: Negative for myalgias.  Skin: Negative for itching and rash.  Neurological: Negative for tremors, focal weakness and weakness.    Patient Active Problem List   Diagnosis Date Noted  . Vaginal yeast infection 01/04/2020  . AKI (acute kidney injury) ([place]) 11/27/2019  . Chronic pain 09/04/2019  . Malignant carcinoid tumor of duodenum ([place])   . Nonalcoholic fatty liver disease 06/05/2019  . Chronic diarrhea   .  COPD exacerbation ([place]) 05/08/2019  . Acute on chronic HFrEF (heart failure with reduced ejection fraction) ([place])   . Restrictive lung disease secondary to obesity   . History of gastric ulcer   . Uncontrolled type 2 diabetes mellitus with hyperglycemia ([place])   . Fibroid uterus 02/23/2019  . Chronic systolic CHF (congestive heart failure) ([place]) 12/19/2018  . Intractable nausea and vomiting 12/17/2018  . Hypoxia 12/17/2018  . Abdominal pain 07/17/2018  . Cardiac arrest ([place]) 11/29/2017  . Leukocytosis  11/29/2017  . Anxiety 11/29/2017  . Stroke (cerebrum) ([place]) 03/19/2017  . GERD (gastroesophageal reflux disease) 03/19/2017  . Depression 03/19/2017  . Morbid obesity ([place])   . Normocytic normochromic anemia 08/16/2016  . Diabetic gastroparesis ([place]) 06/05/2016  . Gout 06/05/2016  . Atypical chest pain 09/26/2015  . Hypokalemia 09/26/2015  . Hypomagnesemia 09/26/2015  . DKA (diabetic ketoacidoses) 05/25/2015  . Uncontrolled type 2 diabetes mellitus with diabetic neuropathy, with long-term current use of insulin ([place]) 05/25/2015  . Type 2 diabetes mellitus with hyperglycemia, with long-term current use of insulin ([place]) 05/25/2015  . CKD (chronic kidney disease), stage II 05/25/2015  . Essential hypertension, benign 09/28/2013    Past Medical History: Past Medical History:  Diagnosis Date  . Acute back pain with sciatica, left   . Acute back pain with sciatica, right   . AKI (acute kidney injury) ([place])   . Anemia, unspecified   . Cancer ([place])   . Carcinoid tumor of duodenum   . Chronic pain   . Chronic systolic CHF (congestive heart failure) ([place])   . Diabetes mellitus   . Esophageal reflux   . Fibromyalgia   . Gastric ulcer   . Gastroparesis   . Gout   . Hyperlipidemia   . Hypertension   . Lumbosacral stenosis   . NICM (nonischemic cardiomyopathy) ([place])   . Obesity   . PAF (paroxysmal atrial fibrillation) ([place])   . Stroke ([place]) 02/2011  . Vitamin B12 deficiency anemia     Past Surgical History: Past Surgical History:  Procedure Laterality Date  . BIOPSY  07/27/2019   Procedure: BIOPSY;  Surgeon: Clarene Essex, MD;  Location: [facility] ENDOSCOPY;  Service: Endoscopy;;  . BIOPSY  07/30/2019   Procedure: BIOPSY;  Surgeon: Otis Brace, MD;  Location: [facility] ENDOSCOPY;  Service: Gastroenterology;;  . CATARACT EXTRACTION  01/2014  . CHOLECYSTECTOMY    . COLONOSCOPY WITH PROPOFOL N/A 07/30/2019   Procedure: COLONOSCOPY WITH PROPOFOL;  Surgeon: Otis Brace, MD;  Location:  [facility] ENDOSCOPY;  Service: Gastroenterology;  Laterality: N/A;  . ESOPHAGOGASTRODUODENOSCOPY N/A 07/27/2019   Procedure: ESOPHAGOGASTRODUODENOSCOPY (EGD);  Surgeon: Clarene Essex, MD;  Location: [facility];  Service: Endoscopy;  Laterality: N/A;  . ESOPHAGOGASTRODUODENOSCOPY (EGD) WITH PROPOFOL N/A 08/02/2019   Procedure: ESOPHAGOGASTRODUODENOSCOPY (EGD) WITH PROPOFOL;  Surgeon: Otis Brace, MD;  Location: [facility];  Service: Gastroenterology;  Laterality: N/A;  . HEMOSTASIS CLIP PLACEMENT  08/02/2019   Procedure: HEMOSTASIS CLIP PLACEMENT;  Surgeon: Otis Brace, MD;  Location: [facility];  Service: Gastroenterology;;  . POLYPECTOMY  07/30/2019   Procedure: POLYPECTOMY;  Surgeon: Otis Brace, MD;  Location: [facility];  Service: Gastroenterology;;  . POLYPECTOMY  08/02/2019   Procedure: POLYPECTOMY;  Surgeon: Otis Brace, MD;  Location: [facility];  Service: Gastroenterology;;    Social History: Social History   Tobacco Use  . Smoking status: Never Smoker  . Smokeless tobacco: Never Used  Vaping Use  . Vaping Use: Never used  Substance Use Topics  . Alcohol use: No  .  Drug use: No    Family History: Family History  Problem Relation Age of Onset  . Diabetes Mother   . Diabetes Father   . Heart disease Father   . Diabetes Sister   . Congestive Heart Failure Sister 6  . Diabetes Brother     Allergies and Medications: Allergies  Allergen Reactions  . Contrast Media [Iodinated Diagnostic Agents] Anaphylaxis    Cardiac arrest  . Diazepam [name]  . Isovue [Iopamidol] Anaphylaxis    Patient had seizure like activity and then code post 100 cc of isovue 300  . Lisinopril Anaphylaxis    Tongue and mouth swelling  . Penicillins Palpitations    Has patient had a PCN reaction causing immediate rash, facial/tongue/throat swelling, SOB or lightheadedness with hypotension: Yes, heart races Has patient had a PCN reaction causing severe rash  involving mucus membranes or skin necrosis: No Has patient had a PCN reaction that required hospitalization: Yes  Has patient had a PCN reaction occurring within the last 10 years: No   . Acetaminophen Nausea Only and Other (See Comments)    Irritates stomach ulcer  Abdominal pain  . Tolmetin Nausea Only and Other (See Comments)    ULCER  . Aspirin Other (See Comments)    Irritates stomach ulcer   . Metoclopramide Other (See Comments)    Tardive dyskinesia  . Nsaids Other (See Comments)    ULCER  . Tramadol Nausea And Vomiting   No current facility-administered medications on file prior to encounter.   Current Outpatient Medications on File Prior to Encounter  Medication Sig Dispense Refill  . albuterol (PROVENTIL) (2.5 MG/3ML) 0.083% nebulizer solution Take 3 mLs (2.5 mg total) by nebulization every 6 (six) hours as needed for wheezing or shortness of breath. 150 mL 1  . ALPRAZolam (XANAX) 1 MG tablet Take 1 tablet (1 mg total) by mouth 2 (two) times daily as needed for anxiety. (Patient not taking: Reported on 11/26/2019) 60 tablet 0  . atorvastatin (LIPITOR) 10 MG tablet TAKE ONE TABLET BY MOUTH ONCE DAILY (AM) (Patient taking differently: Take 10 mg by mouth daily. ) 30 tablet 11  . blood glucose meter kit and supplies KIT Dispense based on patient and insurance preference. Use up to four times daily as directed. (FOR ICD-9 250.00, 250.01). 1 each 0  . busPIRone (BUSPAR) 5 MG tablet Take 1 tablet (5 mg total) by mouth 2 (two) times daily. 60 tablet 1  . cetirizine (ZYRTEC) 10 MG tablet TAKE ONE TABLET BY MOUTH ONCE DAILY (AM) (Patient taking differently: Take 10 mg by mouth daily. ) 30 tablet 11  . colchicine 0.6 MG tablet Take 2 tablets at 1 time (1.2 mg total) and 1 hour later take 1 tablet 3 tablet 0  . DULoxetine HCl 40 MG CPEP Take 40 mg by mouth in the morning and at bedtime.    [name] EASY COMFORT PEN NEEDLES 31G X 5 MM MISC USE 3 TIMES A DAY FOR INSULIN ADMINISTRATION 100 each 3   . ELIQUIS 5 MG TABS tablet TAKE 1 TABLET BY MOUTH 2 (TWO) TIMES DAILY. 60 tablet 2  . FARXIGA 10 MG TABS tablet Take 10 mg by mouth daily.    . ferrous sulfate 325 (65 FE) MG tablet Take 1 tablet (325 mg total) by mouth daily with breakfast. 30 tablet 0  . fluticasone (FLONASE) 50 MCG/ACT nasal spray Place 2 sprays into both nostrils daily as needed for allergies or rhinitis. 16 g 2  .  hydrALAZINE (APRESOLINE) 25 MG tablet TAKE 1 TABLET BY MOUTH 3 (THREE) TIMES DAILY. FOR BLOOD PRESSURE (Patient taking differently: Take 25 mg by mouth 3 (three) times daily. ) 90 tablet 2  . HYDROmorphone (DILAUDID) 2 MG tablet Take 1 tablet (2 mg total) by mouth daily as needed for severe pain. 30 tablet 0  . HYDROmorphone (DILAUDID) 2 MG tablet TAKE ONE-HALF TABLETS (1 MG TOTAL) BY MOUTH DAILY AS NEEDED FOR SEVERE PAIN. 30 tablet 0  . hydrOXYzine (ATARAX/VISTARIL) 10 MG tablet Take 1 tablet (10 mg total) by mouth 3 (three) times daily as needed. (Patient taking differently: Take 10 mg by mouth 3 (three) times daily as needed for itching or anxiety. ) 30 tablet 0  . insulin aspart (NOVOLOG FLEXPEN) 100 UNIT/ML FlexPen Inject 15 Units into the skin 3 (three) times daily with meals. (Patient taking differently: Inject 30 Units into the skin 3 (three) times daily with meals. ) 15 mL 0  . isosorbide mononitrate (IMDUR) 60 MG 24 hr tablet TAKE 1 TABLET (60 MG TOTAL) BY MOUTH DAILY. 30 tablet 1  . LANTUS SOLOSTAR 100 UNIT/ML Solostar Pen Inject 50 Units into the skin at bedtime. 15 mL   . melatonin 3 MG TABS tablet TAKE 2 TABLETS BY MOUTH AT BEDTIME. 60 tablet 2  . methocarbamol (ROBAXIN) 500 MG tablet Take 500 mg by mouth daily.     . metoprolol succinate (TOPROL-XL) 25 MG 24 hr tablet TAKE ONE TABLET BY MOUTH ONCE DAILY 30 tablet 2  . nitroGLYCERIN (NITROSTAT) 0.4 MG SL tablet Place 1 tablet (0.4 mg total) under the tongue every 5 (five) minutes as needed for chest pain. 30 tablet 1  . ondansetron (ZOFRAN-ODT) 4 MG  disintegrating tablet Take 1 tablet (4 mg total) by mouth every 8 (eight) hours as needed for nausea or vomiting. 30 tablet 0  . pantoprazole (PROTONIX) 40 MG tablet TAKE ONE TABLET BY MOUTH TWICE DAILY 60 tablet 3  . pregabalin (LYRICA) 25 MG capsule Take 1 capsule (25 mg total) by mouth at bedtime. 30 capsule 0  . torsemide (DEMADEX) 20 MG tablet TAKE 2 TABLETS (40 MG TOTAL) BY MOUTH DAILY. 60 tablet 0    Objective: BP (!) 146/90   Pulse (!) 107   Temp 98.8 F (37.1 C) (Oral)   Resp 20   LMP 10/10/2012   SpO2 98%   Physical Exam Constitutional:      Appearance: She is obese. She is ill-appearing.  Eyes:     Extraocular Movements: Extraocular movements intact.     Pupils: Pupils are equal, round, and reactive to light.  Neck:     Vascular: JVD present.  Cardiovascular:     Rate and Rhythm: Regular rhythm. Tachycardia present.     Heart sounds: Heart sounds are distant.  Pulmonary:     Breath sounds: Normal breath sounds.     Comments: Breathing comfortably on 2 L nasal cannula. Abdominal:     General: Bowel sounds are normal.     Palpations: Abdomen is soft.     Tenderness: There is guarding.  Musculoskeletal:     Cervical back: Normal range of motion and neck supple.     Right lower leg: Tenderness present. Edema present.     Left lower leg: Tenderness present. Edema present.  Lymphadenopathy:     Cervical: No cervical adenopathy.  Skin:    General: Skin is warm and dry.     Capillary Refill: Capillary refill takes less than 2 seconds.  Coloration: Skin is not cyanotic.     Findings: No ecchymosis or rash.  Neurological:     General: No focal deficit present.     Cranial Nerves: No cranial nerve deficit.      Labs and Imaging: CBC BMET  Recent Labs  Lab 01/07/20 1807  WBC 12.1*  HGB 8.8*  HCT 29.8*  PLT 552*   Recent Labs  Lab 01/07/20 1807  NA 139  K 3.4*  CL 105  CO2 21*  BUN 12  CREATININE 1.22*  GLUCOSE 219*  CALCIUM 8.3*     DG Chest  Portable 1 View  Result Date: 01/08/2020 CLINICAL DATA:  Chest pain. EXAM: PORTABLE CHEST 1 VIEW COMPARISON:  11/26/2019. FINDINGS: Cardiomegaly with pulmonary venous congestion. Bilateral pulmonary infiltrates/edema. No pleural effusion or pneumothorax. IMPRESSION: Cardiomegaly with pulmonary venous congestion. Bilateral pulmonary infiltrates/edema. Findings most consistent with CHF. Electronically Signed   By: Marcello Moores  Register   On: 01/08/2020 06:59     Matilde Haymaker, MD 01/08/2020, 10:24 AM PGY-3, [place] Intern pager: [phone], text pages welcome

## 2020-01-08 NOTE — Telephone Encounter (Signed)
Patient is currently admitted will f/u.

## 2020-01-08 NOTE — ED Provider Notes (Signed)
[facility] EMERGENCY DEPARTMENT Provider Note   [name]: [id number] Arrival date & time: 01/07/20  1754     History Chief Complaint  Patient presents with  . Chest Pain  . Abdominal Pain  . [name]    Deborah [name] is a 54 y.o. female.   Chest Pain Associated symptoms: abdominal pain, nausea, shortness of breath and vomiting   Associated symptoms: no back pain, no cough, no fever and no palpitations   Abdominal Pain Associated symptoms: chest pain, nausea, shortness of breath and vomiting   Associated symptoms: no chills, no cough, no dysuria, no fever, no hematuria and no sore throat   [name] Associated symptoms: abdominal pain, chest pain and vomiting   Associated symptoms: no cough, no ear pain, no fever, no rash and no sore throat        Past Medical History:  Diagnosis Date  . Acute back pain with sciatica, left   . Acute back pain with sciatica, right   . AKI (acute kidney injury) ([place])   . Anemia, unspecified   . Cancer ([place])   . Carcinoid tumor of duodenum   . Chronic pain   . Chronic systolic CHF (congestive heart failure) ([place])   . Diabetes mellitus   . Esophageal reflux   . Fibromyalgia   . Gastric ulcer   . Gastroparesis   . Gout   . Hyperlipidemia   . Hypertension   . Lumbosacral stenosis   . NICM (nonischemic cardiomyopathy) ([place])   . Obesity   . PAF (paroxysmal atrial fibrillation) ([place])   . Stroke ([place]) 02/2011  . Vitamin B12 deficiency anemia     Patient Active Problem List   Diagnosis Date Noted  . Vaginal yeast infection 01/04/2020  . AKI (acute kidney injury) ([place]) 11/27/2019  . Chronic pain 09/04/2019  . Malignant carcinoid tumor of duodenum ([place])   . Nonalcoholic fatty liver disease 06/05/2019  . Chronic diarrhea   . COPD exacerbation ([place]) 05/08/2019  . Acute on chronic HFrEF (heart failure with reduced ejection fraction) ([place])   . Restrictive lung disease secondary to obesity     . History of gastric ulcer   . Uncontrolled type 2 diabetes mellitus with hyperglycemia ([place])   . Fibroid uterus 02/23/2019  . Chronic systolic CHF (congestive heart failure) ([place]) 12/19/2018  . Intractable nausea and vomiting 12/17/2018  . Hypoxia 12/17/2018  . Abdominal pain 07/17/2018  . Cardiac arrest ([place]) 11/29/2017  . Leukocytosis 11/29/2017  . Anxiety 11/29/2017  . Stroke (cerebrum) ([place]) 03/19/2017  . GERD (gastroesophageal reflux disease) 03/19/2017  . Depression 03/19/2017  . Morbid obesity ([place])   . Normocytic normochromic anemia 08/16/2016  . Diabetic gastroparesis ([place]) 06/05/2016  . Gout 06/05/2016  . Atypical chest pain 09/26/2015  . Hypokalemia 09/26/2015  . Hypomagnesemia 09/26/2015  . DKA (diabetic ketoacidoses) 05/25/2015  . Uncontrolled type 2 diabetes mellitus with diabetic neuropathy, with long-term current use of insulin ([place]) 05/25/2015  . Type 2 diabetes mellitus with hyperglycemia, with long-term current use of insulin ([place]) 05/25/2015  . CKD (chronic kidney disease), stage II 05/25/2015  . Essential hypertension, benign 09/28/2013    Past Surgical History:  Procedure Laterality Date  . BIOPSY  07/27/2019   Procedure: BIOPSY;  Surgeon: Clarene Essex, MD;  Location: [facility];  Service: Endoscopy;;  . BIOPSY  07/30/2019   Procedure: BIOPSY;  Surgeon: Otis Brace, MD;  Location: [facility];  Service: Gastroenterology;;  . CATARACT EXTRACTION  01/2014  .  CHOLECYSTECTOMY    . COLONOSCOPY WITH PROPOFOL N/A 07/30/2019   Procedure: COLONOSCOPY WITH PROPOFOL;  Surgeon: Otis Brace, MD;  Location: [facility];  Service: Gastroenterology;  Laterality: N/A;  . ESOPHAGOGASTRODUODENOSCOPY N/A 07/27/2019   Procedure: ESOPHAGOGASTRODUODENOSCOPY (EGD);  Surgeon: Clarene Essex, MD;  Location: [facility];  Service: Endoscopy;  Laterality: N/A;  . ESOPHAGOGASTRODUODENOSCOPY (EGD) WITH PROPOFOL N/A 08/02/2019   Procedure: ESOPHAGOGASTRODUODENOSCOPY (EGD)  WITH PROPOFOL;  Surgeon: Otis Brace, MD;  Location: [facility];  Service: Gastroenterology;  Laterality: N/A;  . HEMOSTASIS CLIP PLACEMENT  08/02/2019   Procedure: HEMOSTASIS CLIP PLACEMENT;  Surgeon: Otis Brace, MD;  Location: [facility];  Service: Gastroenterology;;  . POLYPECTOMY  07/30/2019   Procedure: POLYPECTOMY;  Surgeon: Otis Brace, MD;  Location: [facility];  Service: Gastroenterology;;  . POLYPECTOMY  08/02/2019   Procedure: POLYPECTOMY;  Surgeon: Otis Brace, MD;  Location: [facility];  Service: Gastroenterology;;     OB History    Gravida  0   Para  0   Term  0   Preterm  0   AB  0   Living  0     SAB  0   TAB  0   Ectopic  0   Multiple  0   Live Births  0           Family History  Problem Relation Age of Onset  . Diabetes Mother   . Diabetes Father   . Heart disease Father   . Diabetes Sister   . Congestive Heart Failure Sister 90  . Diabetes Brother     Social History   Tobacco Use  . Smoking status: Never Smoker  . Smokeless tobacco: Never Used  Vaping Use  . Vaping Use: Never used  Substance Use Topics  . Alcohol use: No  . Drug use: No    Home Medications Prior to Admission medications   Medication Sig Start Date End Date Taking? Authorizing Provider  albuterol (PROVENTIL) (2.5 MG/3ML) 0.083% nebulizer solution Take 3 mLs (2.5 mg total) by nebulization every 6 (six) hours as needed for wheezing or shortness of breath. 04/06/19   Scot Jun, FNP  ALPRAZolam Duanne Moron) 1 MG tablet Take 1 tablet (1 mg total) by mouth 2 (two) times daily as needed for anxiety. Patient not taking: Reported on 11/26/2019 09/03/19   [name], DO  atorvastatin (LIPITOR) 10 MG tablet TAKE ONE TABLET BY MOUTH ONCE DAILY (AM) Patient taking differently: Take 10 mg by mouth daily.  11/14/19   Meccariello, Bernita Raisin, DO  blood glucose meter kit and supplies KIT Dispense based on patient and insurance preference. Use up to  four times daily as directed. (FOR ICD-9 250.00, 250.01). 12/13/18   Lockamy, Christia Reading, DO  busPIRone (BUSPAR) 5 MG tablet Take 1 tablet (5 mg total) by mouth 2 (two) times daily. 11/15/19   Meccariello, Bernita Raisin, DO  cetirizine (ZYRTEC) 10 MG tablet TAKE ONE TABLET BY MOUTH ONCE DAILY (AM) Patient taking differently: Take 10 mg by mouth daily.  11/14/19   Meccariello, Bernita Raisin, DO  colchicine 0.6 MG tablet Take 2 tablets at 1 time (1.2 mg total) and 1 hour later take 1 tablet 01/04/20   Gifford Shave, MD  DULoxetine HCl 40 MG CPEP Take 40 mg by mouth in the morning and at bedtime.    [provider]  EASY COMFORT PEN NEEDLES 31G X 5 MM MISC USE 3 TIMES A DAY FOR INSULIN ADMINISTRATION 11/14/19   Meccariello, Bernita Raisin, DO  ELIQUIS 5 MG TABS tablet TAKE 1 TABLET BY MOUTH 2 (TWO) TIMES DAILY. 12/20/19   Meccariello, Bernita Raisin, DO  FARXIGA 10 MG TABS tablet Take 10 mg by mouth daily. 10/02/19   [provider]  ferrous sulfate 325 (65 FE) MG tablet Take 1 tablet (325 mg total) by mouth daily with breakfast. 09/01/19 11/26/19  Alma Friendly, MD  fluticasone (FLONASE) 50 MCG/ACT nasal spray Place 2 sprays into both nostrils daily as needed for allergies or rhinitis. 12/19/18   Rai, Ripudeep K, MD  hydrALAZINE (APRESOLINE) 25 MG tablet TAKE 1 TABLET BY MOUTH 3 (THREE) TIMES DAILY. FOR BLOOD PRESSURE Patient taking differently: Take 25 mg by mouth 3 (three) times daily.  10/02/19   Meccariello, Bernita Raisin, DO  HYDROmorphone (DILAUDID) 2 MG tablet Take 1 tablet (2 mg total) by mouth daily as needed for severe pain. 11/15/19   Raulkar, Clide Deutscher, MD  HYDROmorphone (DILAUDID) 2 MG tablet TAKE ONE-HALF TABLETS (1 MG TOTAL) BY MOUTH DAILY AS NEEDED FOR SEVERE PAIN. 12/17/19   Raulkar, Clide Deutscher, MD  hydrOXYzine (ATARAX/VISTARIL) 10 MG tablet Take 1 tablet (10 mg total) by mouth 3 (three) times daily as needed. Patient taking differently: Take 10 mg by mouth 3 (three) times daily as needed for itching  or anxiety.  11/15/19   Meccariello, Bernita Raisin, DO  insulin aspart (NOVOLOG FLEXPEN) 100 UNIT/ML FlexPen Inject 15 Units into the skin 3 (three) times daily with meals. Patient taking differently: Inject 30 Units into the skin 3 (three) times daily with meals.  06/26/19   Mariel Aloe, MD  isosorbide mononitrate (IMDUR) 60 MG 24 hr tablet TAKE 1 TABLET (60 MG TOTAL) BY MOUTH DAILY. 12/21/19   Meccariello, Bernita Raisin, DO  LANTUS SOLOSTAR 100 UNIT/ML Solostar Pen Inject 50 Units into the skin at bedtime. 11/15/19   Meccariello, Bernita Raisin, DO  melatonin 3 MG TABS tablet TAKE 2 TABLETS BY MOUTH AT BEDTIME. 12/21/19   Meccariello, Bernita Raisin, DO  methocarbamol (ROBAXIN) 500 MG tablet Take 500 mg by mouth daily.     [provider]  metoprolol succinate (TOPROL-XL) 25 MG 24 hr tablet TAKE ONE TABLET BY MOUTH ONCE DAILY 12/21/19   Meccariello, Bernita Raisin, DO  nitroGLYCERIN (NITROSTAT) 0.4 MG SL tablet Place 1 tablet (0.4 mg total) under the tongue every 5 (five) minutes as needed for chest pain. 12/13/18   Lockamy, Christia Reading, DO  ondansetron (ZOFRAN-ODT) 4 MG disintegrating tablet Take 1 tablet (4 mg total) by mouth every 8 (eight) hours as needed for nausea or vomiting. 08/09/19   Rai, Ripudeep K, MD  pantoprazole (PROTONIX) 40 MG tablet TAKE ONE TABLET BY MOUTH TWICE DAILY 09/04/19   [name], DO  pregabalin (LYRICA) 25 MG capsule Take 1 capsule (25 mg total) by mouth at bedtime. 10/10/19   Raulkar, Clide Deutscher, MD  torsemide (DEMADEX) 20 MG tablet TAKE 2 TABLETS (40 MG TOTAL) BY MOUTH DAILY. 12/20/19   Meccariello, Bernita Raisin, DO    Allergies    Contrast media [iodinated diagnostic agents], Diazepam, Isovue [iopamidol], Lisinopril, Penicillins, Acetaminophen, Tolmetin, Aspirin, Metoclopramide, Nsaids, and Tramadol  Review of Systems   Review of Systems  Constitutional: Negative for chills and fever.  HENT: Negative for ear pain and sore throat.   Eyes: Negative for pain and visual disturbance.    Respiratory: Positive for shortness of breath. Negative for cough.   Cardiovascular: Positive for chest pain. Negative for palpitations.  Gastrointestinal: Positive for abdominal pain, nausea and vomiting.  Genitourinary: Negative for dysuria and hematuria.  Musculoskeletal: Negative for arthralgias and back pain.  Skin: Negative for color change and rash.  Neurological: Negative for seizures and syncope.  All other systems reviewed and are negative.   Physical Exam Updated Vital Signs BP (!) 182/108 (BP Location: Right Arm)   Pulse (!) 115   Temp 98.8 F (37.1 C) (Oral)   Resp 16   LMP 10/10/2012   SpO2 100%   Physical Exam Vitals and nursing note reviewed. Exam conducted with a chaperone present.  Constitutional:      General: She is not in acute distress.    Appearance: Normal appearance.  HENT:     Head: Normocephalic and atraumatic.     Nose: No rhinorrhea.  Eyes:     General:        Right eye: No discharge.        Left eye: No discharge.     Conjunctiva/sclera: Conjunctivae normal.  Cardiovascular:     Rate and Rhythm: Normal rate and regular rhythm.  Pulmonary:     Effort: Pulmonary effort is normal. No respiratory distress.     Breath sounds: No stridor.  Abdominal:     General: Abdomen is flat. There is no distension.     Palpations: Abdomen is soft.     Tenderness: There is abdominal tenderness (diffuse). There is no guarding or rebound.  Musculoskeletal:        General: No tenderness or signs of injury.  Skin:    General: Skin is warm and dry.  Neurological:     General: No focal deficit present.     Mental Status: She is alert. Mental status is at baseline.     Motor: No weakness.  Psychiatric:        Mood and Affect: Mood normal.        Behavior: Behavior normal.     ED Results / Procedures / Treatments   Labs (all labs ordered are listed, but only abnormal results are displayed) Labs Reviewed  COMPREHENSIVE METABOLIC PANEL - Abnormal;  Notable for the following components:      Result Value   Potassium 3.4 (*)    CO2 21 (*)    Glucose, Bld 219 (*)    Creatinine, Ser 1.22 (*)    Calcium 8.3 (*)    Albumin 2.7 (*)    GFR, Estimated 52 (*)    All other components within normal limits  CBC - Abnormal; Notable for the following components:   WBC 12.1 (*)    RBC 3.23 (*)    Hemoglobin 8.8 (*)    HCT 29.8 (*)    MCHC 29.5 (*)    Platelets 552 (*)    All other components within normal limits  TROPONIN I (HIGH SENSITIVITY) - Abnormal; Notable for the following components:   Troponin I (High Sensitivity) 33 (*)    All other components within normal limits  TROPONIN I (HIGH SENSITIVITY) - Abnormal; Notable for the following components:   Troponin I (High Sensitivity) 33 (*)    All other components within normal limits  LIPASE, BLOOD  URINALYSIS, ROUTINE W REFLEX MICROSCOPIC    EKG EKG Interpretation  Date/Time:  Tuesday January 08 2020 07:29:15 EDT Ventricular Rate:  115 PR Interval:    QRS Duration: 83 QT Interval:  351 QTC Calculation: 486 R Axis:   -44 Text Interpretation: Sinus tachycardia Left anterior fascicular block Consider left ventricular hypertrophy Borderline prolonged QT interval Confirmed by Dewaine Conger [phone]) on  01/08/2020 7:31:18 AM   Radiology DG Chest Portable 1 View  Result Date: 01/08/2020 CLINICAL DATA:  Chest pain. EXAM: PORTABLE CHEST 1 VIEW COMPARISON:  11/26/2019. FINDINGS: Cardiomegaly with pulmonary venous congestion. Bilateral pulmonary infiltrates/edema. No pleural effusion or pneumothorax. IMPRESSION: Cardiomegaly with pulmonary venous congestion. Bilateral pulmonary infiltrates/edema. Findings most consistent with CHF. Electronically Signed   By: Marcello Moores  Register   On: 01/08/2020 06:59    Procedures Procedures (including critical care time)  Medications Ordered in ED Medications  HYDROmorphone (DILAUDID) injection 1 mg (has no administration in time range)  sodium chloride  0.9 % bolus 1,000 mL (has no administration in time range)    ED Course  I have reviewed the triage vital signs and the nursing notes.  Pertinent labs & imaging results that were available during my care of the patient were reviewed by me and considered in my medical decision making (see chart for details).    MDM Rules/Calculators/A&P                          Morbidly obese history of uncontrolled diabetes coming in with what she believes to be either DKA or gastroparesis.  EKG shows sinus tachycardia with no acute ischemic change interval abnormality or arrhythmia after my review.  Chest x-ray reviewed by radiology myself does show some signs of increased volume.  Patient symptoms however were sudden onset and more consistent with gastroparesis or DKA.  She is not had worsening shortness of breath over time.  She is not had worsening chest pain over time.  Troponins are mildly elevated as they have been in the past, not in DKA based on review of the chemistry.  No significant kidney dysfunction either.  She will get laboratory studies she will get pain control and we will reassess.  Pt care was handed off to on coming provider at 0730.  Complete history and physical and current plan have been communicated.  Please refer to their note for the remainder of ED care and ultimate disposition.  Pt seen in conjunction with Dr. Melina Copa  Final Clinical Impression(s) / ED Diagnoses Final diagnoses:  None    Rx / DC Orders ED Discharge Orders    None       Breck Coons, MD 01/08/20 [phone]

## 2020-01-08 NOTE — Progress Notes (Signed)
Inpatient Diabetes Program Recommendations  AACE/ADA: New Consensus Statement on Inpatient Glycemic Control (2015)  Target Ranges:  Prepandial:   less than 140 mg/dL      Peak postprandial:   less than 180 mg/dL (1-2 hours)      Critically ill patients:  140 - 180 mg/dL   Lab Results  Component Value Date   GLUCAP 288 (H) 01/08/2020   HGBA1C 8.2 (H) 01/08/2020    Review of Glycemic Control  Diabetes history: DM Outpatient Diabetes medications: Lantus 50 units + Farxiga 10 mg + Novolog 30 units am,pm + hs Current orders for Inpatient glycemic control: Lantus 20 units + Farxiga 10 mg qd + Moderate tid  Inpatient Diabetes Program Recommendations:   Consider holding Wilder Glade while in the hospital  Thank you, Nani Gasser. Maryrose Colvin, RN, MSN, CDE  Diabetes Coordinator Inpatient Glycemic Control Team Team Pager [phone] (8am-5pm) 01/08/2020 1:54 PM

## 2020-01-09 ENCOUNTER — Encounter (HOSPITAL_COMMUNITY): Payer: Self-pay | Admitting: Family Medicine

## 2020-01-09 DIAGNOSIS — R079 Chest pain, unspecified: Secondary | ICD-10-CM | POA: Diagnosis not present

## 2020-01-09 DIAGNOSIS — Z20822 Contact with and (suspected) exposure to covid-19: Secondary | ICD-10-CM | POA: Diagnosis present

## 2020-01-09 DIAGNOSIS — E1143 Type 2 diabetes mellitus with diabetic autonomic (poly)neuropathy: Secondary | ICD-10-CM | POA: Diagnosis present

## 2020-01-09 DIAGNOSIS — M797 Fibromyalgia: Secondary | ICD-10-CM | POA: Diagnosis present

## 2020-01-09 DIAGNOSIS — I130 Hypertensive heart and chronic kidney disease with heart failure and stage 1 through stage 4 chronic kidney disease, or unspecified chronic kidney disease: Secondary | ICD-10-CM | POA: Diagnosis present

## 2020-01-09 DIAGNOSIS — F419 Anxiety disorder, unspecified: Secondary | ICD-10-CM | POA: Diagnosis present

## 2020-01-09 DIAGNOSIS — E876 Hypokalemia: Secondary | ICD-10-CM | POA: Diagnosis not present

## 2020-01-09 DIAGNOSIS — K3184 Gastroparesis: Secondary | ICD-10-CM | POA: Diagnosis present

## 2020-01-09 DIAGNOSIS — N179 Acute kidney failure, unspecified: Secondary | ICD-10-CM | POA: Diagnosis not present

## 2020-01-09 DIAGNOSIS — K219 Gastro-esophageal reflux disease without esophagitis: Secondary | ICD-10-CM | POA: Diagnosis present

## 2020-01-09 DIAGNOSIS — F32A Depression, unspecified: Secondary | ICD-10-CM | POA: Diagnosis present

## 2020-01-09 DIAGNOSIS — R109 Unspecified abdominal pain: Secondary | ICD-10-CM | POA: Diagnosis not present

## 2020-01-09 DIAGNOSIS — K529 Noninfective gastroenteritis and colitis, unspecified: Secondary | ICD-10-CM | POA: Diagnosis present

## 2020-01-09 DIAGNOSIS — E1122 Type 2 diabetes mellitus with diabetic chronic kidney disease: Secondary | ICD-10-CM | POA: Diagnosis present

## 2020-01-09 DIAGNOSIS — J9611 Chronic respiratory failure with hypoxia: Secondary | ICD-10-CM | POA: Diagnosis present

## 2020-01-09 DIAGNOSIS — I428 Other cardiomyopathies: Secondary | ICD-10-CM | POA: Diagnosis present

## 2020-01-09 DIAGNOSIS — M4807 Spinal stenosis, lumbosacral region: Secondary | ICD-10-CM | POA: Diagnosis present

## 2020-01-09 DIAGNOSIS — I480 Paroxysmal atrial fibrillation: Secondary | ICD-10-CM | POA: Diagnosis present

## 2020-01-09 DIAGNOSIS — R112 Nausea with vomiting, unspecified: Secondary | ICD-10-CM | POA: Diagnosis not present

## 2020-01-09 DIAGNOSIS — C7A010 Malignant carcinoid tumor of the duodenum: Secondary | ICD-10-CM | POA: Diagnosis present

## 2020-01-09 DIAGNOSIS — R0789 Other chest pain: Secondary | ICD-10-CM | POA: Diagnosis present

## 2020-01-09 DIAGNOSIS — R1084 Generalized abdominal pain: Secondary | ICD-10-CM | POA: Diagnosis present

## 2020-01-09 DIAGNOSIS — N1832 Chronic kidney disease, stage 3b: Secondary | ICD-10-CM | POA: Diagnosis present

## 2020-01-09 DIAGNOSIS — E785 Hyperlipidemia, unspecified: Secondary | ICD-10-CM | POA: Diagnosis present

## 2020-01-09 DIAGNOSIS — I5022 Chronic systolic (congestive) heart failure: Secondary | ICD-10-CM | POA: Diagnosis present

## 2020-01-09 DIAGNOSIS — M109 Gout, unspecified: Secondary | ICD-10-CM | POA: Diagnosis present

## 2020-01-09 LAB — URINALYSIS, ROUTINE W REFLEX MICROSCOPIC
Bilirubin Urine: NEGATIVE
Glucose, UA: >=500 mg/dL — AB
Ketones, ur: NEGATIVE mg/dL
Leukocytes,Ua: NEGATIVE
Nitrite: NEGATIVE
Protein, ur: 100 mg/dL — AB
Specific Gravity, Urine: 1.005 (ref 1.005–1.030)
pH: 5 (ref 5.0–8.0)

## 2020-01-09 LAB — BASIC METABOLIC PANEL
Anion gap: 12 (ref 5–15)
Anion gap: 12 (ref 5–15)
BUN: 15 mg/dL (ref 6–20)
BUN: 17 mg/dL (ref 6–20)
CO2: 24 mmol/L (ref 22–32)
CO2: 24 mmol/L (ref 22–32)
Calcium: 8.2 mg/dL — ABNORMAL LOW (ref 8.9–10.3)
Calcium: 8.4 mg/dL — ABNORMAL LOW (ref 8.9–10.3)
Chloride: 101 mmol/L (ref 98–111)
Chloride: 99 mmol/L (ref 98–111)
Creatinine, Ser: 1.56 mg/dL — ABNORMAL HIGH (ref 0.44–1.00)
Creatinine, Ser: 2.05 mg/dL — ABNORMAL HIGH (ref 0.44–1.00)
GFR, Estimated: 39 mL/min — ABNORMAL LOW (ref >60)
GFR, Estimated: 28 mL/min — ABNORMAL LOW (ref >60)
Glucose, Bld: 215 mg/dL — ABNORMAL HIGH (ref 70–99)
Glucose, Bld: 222 mg/dL — ABNORMAL HIGH (ref 70–99)
Potassium: 2.9 mmol/L — ABNORMAL LOW (ref 3.5–5.1)
Potassium: 3.1 mmol/L — ABNORMAL LOW (ref 3.5–5.1)
Sodium: 137 mmol/L (ref 135–145)
Sodium: 135 mmol/L (ref 135–145)

## 2020-01-09 LAB — GLUCOSE, CAPILLARY
Glucose-Capillary: 172 mg/dL — ABNORMAL HIGH (ref 70–99)
Glucose-Capillary: 277 mg/dL — ABNORMAL HIGH (ref 70–99)
Glucose-Capillary: 214 mg/dL — ABNORMAL HIGH (ref 70–99)
Glucose-Capillary: 244 mg/dL — ABNORMAL HIGH (ref 70–99)

## 2020-01-09 LAB — CBC
HCT: 27.4 % — ABNORMAL LOW (ref 36.0–46.0)
Hemoglobin: 8 g/dL — ABNORMAL LOW (ref 12.0–15.0)
MCH: 26.8 pg (ref 26.0–34.0)
MCHC: 29.2 g/dL — ABNORMAL LOW (ref 30.0–36.0)
MCV: 91.9 fL (ref 80.0–100.0)
Platelets: 494 10*3/uL — ABNORMAL HIGH (ref 150–400)
RBC: 2.98 MIL/uL — ABNORMAL LOW (ref 3.87–5.11)
RDW: 15.7 % — ABNORMAL HIGH (ref 11.5–15.5)
WBC: 11.3 10*3/uL — ABNORMAL HIGH (ref 4.0–10.5)
nRBC: 0 % (ref 0.0–0.2)

## 2020-01-09 LAB — MAGNESIUM: Magnesium: 1.8 mg/dL (ref 1.7–2.4)

## 2020-01-09 MED ORDER — HYDROMORPHONE HCL 2 MG PO TABS
1.0000 mg | ORAL_TABLET | Freq: Four times a day (QID) | ORAL | Status: DC | PRN
  Administered 2020-01-09: 1 mg via ORAL
  Filled 2020-01-09: qty 1

## 2020-01-09 MED ORDER — HYDROMORPHONE HCL 2 MG PO TABS: 1.0000 mg | ORAL_TABLET | Freq: Every day | ORAL | Status: DC | PRN

## 2020-01-09 MED ORDER — ALPRAZOLAM 0.5 MG PO TABS: 1.0000 mg | ORAL_TABLET | Freq: Two times a day (BID) | ORAL | Status: DC | PRN

## 2020-01-09 MED ORDER — INSULIN ASPART 100 UNIT/ML ~~LOC~~ SOLN
3.0000 [IU] | Freq: Once | SUBCUTANEOUS | Status: AC
  Administered 2020-01-09: 3 [IU] via SUBCUTANEOUS

## 2020-01-09 MED ORDER — POTASSIUM CHLORIDE CRYS ER 10 MEQ PO TBCR
40.0000 meq | EXTENDED_RELEASE_TABLET | Freq: Once | ORAL | Status: AC
  Administered 2020-01-09: 40 meq via ORAL
  Filled 2020-01-09: qty 4

## 2020-01-09 MED ORDER — POTASSIUM CHLORIDE CRYS ER 20 MEQ PO TBCR
40.0000 meq | EXTENDED_RELEASE_TABLET | Freq: Once | ORAL | Status: AC
  Administered 2020-01-09: 40 meq via ORAL
  Filled 2020-01-09: qty 2

## 2020-01-09 MED ORDER — HYDROMORPHONE HCL 1 MG/ML IJ SOLN
1.0000 mg | Freq: Three times a day (TID) | INTRAMUSCULAR | Status: DC | PRN
  Administered 2020-01-09 – 2020-01-10 (×2): 1 mg via INTRAVENOUS
  Filled 2020-01-09 (×2): qty 1

## 2020-01-09 MED ORDER — SCOPOLAMINE 1 MG/3DAYS TD PT72
1.0000 | MEDICATED_PATCH | TRANSDERMAL | Status: DC | PRN
  Administered 2020-01-10 – 2020-01-14 (×2): 1.5 mg via TRANSDERMAL
  Filled 2020-01-09 (×2): qty 1

## 2020-01-09 MED ORDER — ALPRAZOLAM 0.5 MG PO TABS
1.0000 mg | ORAL_TABLET | Freq: Once | ORAL | Status: AC
  Administered 2020-01-09: 1 mg via ORAL
  Filled 2020-01-09: qty 2

## 2020-01-09 NOTE — Progress Notes (Signed)
Received call back from [place]. Informed patient requesting IV dilaudid and PO is not controlling her pain. No new orders given at this time.

## 2020-01-09 NOTE — Progress Notes (Signed)
CBG is 244 mg/dL, Dr. Ouida Sills notified and received 3 units of NovoLOG insulin x one dose. Will administer and continue to monitor.

## 2020-01-09 NOTE — Progress Notes (Signed)
Inpatient Diabetes Program Recommendations  AACE/ADA: New Consensus Statement on Inpatient Glycemic Control (2015)  Target Ranges:  Prepandial:   less than 140 mg/dL      Peak postprandial:   less than 180 mg/dL (1-2 hours)      Critically ill patients:  140 - 180 mg/dL   Lab Results  Component Value Date   GLUCAP 277 (H) 01/09/2020   HGBA1C 8.2 (H) 01/08/2020    Review of Glycemic Control Results for Deborah [name], Deborah [name] (MRN [id number]) as of 01/09/2020 13:53  Ref. Range 01/08/2020 19:23 01/09/2020 07:56 01/09/2020 11:32  Glucose-Capillary Latest Ref Range: 70 - 99 mg/dL 239 (H) 172 (H) 277 (H)   Diabetes history: DM Outpatient Diabetes medications: Lantus 50 units + Farxiga 10 mg + Novolog 30 units am,pm + hs Current orders for Inpatient glycemic control: Lantus 20 units + Farxiga 10 mg qd + Moderate tid  Inpatient Diabetes Program Recommendations:  Consider adding Novolog 3 units TID (assuming patient is consuming >50% of meal).   Thanks,  Bronson Curb, MSN, RNC-OB Diabetes Coordinator [phone] (8a-5p)

## 2020-01-09 NOTE — Progress Notes (Signed)
INTERIM PROGRESS NOTE  Visited patient on 4th floor. Spoke to her nurse this evening who reported patient was eating a lot and eating well. Patient was asking for IV Dilaudid since the PO form has not been helpful for her in the past. Visited patient who reported nausea and said she was not able to tolerate the food she had been receiving.   RN had just received IV Dilaudid and will give to patient as previously planned by the day team.   RN also checked patient's blood sugar and noted it to be 240s. Patient does not have an night-time coverage. Gave her 3 units of Novolog for one-time dosing.   Milus Banister, [place], PGY-3 01/09/2020 9:09 PM

## 2020-01-09 NOTE — Progress Notes (Signed)
Family Medicine Teaching Service Daily Progress Note Intern Pager: [phone]  Patient name: Deborah [name] Medical record number: [id number] Date of birth: 06-16-1965 Age: 54 y.o. Gender: female  Primary Care Provider: Cleophas Dunker, DO Consultants: none Code Status: full  Pt Overview and Major Events to Date:  11/2 - admitted  Assessment and Plan: Deborah [name] is a 54 y.o. female presenting with 2 days of abdominal pain.  Her previous medical history is significant for A. fib, diabetes type 2, gastroparesis, carcinoid tumor of the duodenum with recent surgery.  Acute on chronic Abdominal Pain 2d hx of b/l LQ abd pain w/ n/v.  Pt w/ hx of gastroparesis. Wbc minimially elevated. Lipase normal, LFTs normal. 2 recent CT abdomens were negative w/ similar presentation. She has chronic abdominal pain for which she takes dilaudid at home ( approx 1mg  daily; sometimes goes days w/o taking it). Pt states she is also taking xanax at home, but not on days she takes dilaudid, according to pt.    - avoiding metoclopramide - zofran for nausea - switch to PO dilaudid  Chest Pain  Pt describes right sided chest pain.  trops peaked at 28.  ekg w/ no st changes. Tender to palpation - continue to monitor.    HFmrEF Previous Echo: 45-50% LVEF.  Global hypokinesis.  Not taking home torsemide for the past week..   - IV lasix BID. Switch to home  - strict I/Os - bmp's - restarted home meds: farxiga, Imdur, toprol  CKD 3b Baseline creatinine ~ 1.4.  Better on admission, slightly elevated today. No evidence of AKI.  - am BMPs  A fib Pt taking eliquis 5mg  bid at home.  On toprol xl 25mg .  - continue home meds  Diabetes type 2 At home she takes Lantus 50 U qhs and novolog 30mg  TID w/ meals - lantus 20 U  - mSSI  Fibromyalgia  on duloxetine at home.   - continue home med  Anxiety Home medications include buspar and xanax prn.  Pt states she rarely takes her xanax.  -  continue buspar - holding xanax  FEN/GI: carb modified diet PPx: eliquis  Disposition: home  Subjective:  Pt complaining of abdominal and chest pain this morning.  She states this is 'about the same' as it was yesterday.  She thinks the chest pain is due to her recent vomiting.  She endorses nausea with food but not liquids. She has not had vomiting in the past 24 hrs.  She does endorse loose stools, which she states is worse than usual.  She states she did not urinate much yesterday but expects to today.    Objective: Temp:  [97.5 F (36.4 C)-98.7 F (37.1 C)] 97.5 F (36.4 C) (11/03 0800) Pulse Rate:  [94-118] 97 (11/03 0821) Resp:  [14-35] 19 (11/03 0821) BP: (103-162)/(50-118) 103/85 (11/03 0800) SpO2:  [93 %-100 %] 96 % (11/03 0821) Weight:  [158.8 kg] 158.8 kg (11/03 0038) Physical Exam: General: alert. Obese woman, appears older than stated age. Sitting in chair. No acute distress.   Cardiovascular: RRR. No murmurs.  Respiratory: LCTAB.   Abdomen: obese pannus.  Grimaces w/ light palpation diffusely.  No reaction when similar pressure applied while auscultating w/ stethoscope.  Extremities: TTP on the ankles b/l.  Nonpitting edema b/l.    Laboratory: Recent Labs  Lab 01/07/20 1807 01/08/20 1220 01/09/20 0238  WBC 12.1* 12.8* 11.3*  HGB 8.8* 8.6* 8.0*  HCT 29.8* 29.0* 27.4*  PLT 552* 554*  494*   Recent Labs  Lab 01/07/20 1807 01/08/20 1220 01/09/20 0238  NA 139 138 137  K 3.4* 3.5 2.9*  CL 105 103 101  CO2 21* 23 24  BUN 12 15 15   CREATININE 1.22* 1.46* 1.56*  CALCIUM 8.3* 8.2* 8.2*  PROT 7.3 7.2  --   BILITOT 0.7 0.5  --   ALKPHOS 99 96  --   ALT 14 14  --   AST 18 17  --   GLUCOSE 219* 322* 215*    Imaging/Diagnostic Tests:   Benay Pike, MD 01/09/2020, 8:23 AM PGY-3, [place] Intern pager: [phone], text pages welcome

## 2020-01-09 NOTE — Plan of Care (Signed)
The patient is admitted to 4 N P with the diagnosis of abdominal pain . Alert and oriented x 4. The patient is oriented to her room, call bell/ascom and staff. She's resting quietly at this time with her eyes closed, respirations even and unlabored. Will continue to monitor.

## 2020-01-09 NOTE — Progress Notes (Signed)
Patient states dilaudid PO is not effectively managing her pain. States her pain level 10/10 now. Patient requesting to be placed back on IV dilaudid. MD paged awaiting return call.

## 2020-01-09 NOTE — Progress Notes (Signed)
Patient noted to have eaten a cheeseburger with ketchup/mayo and a sugar cookie. She was able to tolerate the meal without vomiting. Patient did not complain of nausea prior to her meal. Patient is demanding IV dilaudid and refusing PO stating the oral route will take too long to control her pain. MD in room to discuss pain control.

## 2020-01-10 ENCOUNTER — Other Ambulatory Visit (HOSPITAL_COMMUNITY): Payer: Medicare Other

## 2020-01-10 DIAGNOSIS — R109 Unspecified abdominal pain: Secondary | ICD-10-CM | POA: Diagnosis not present

## 2020-01-10 LAB — BASIC METABOLIC PANEL
Anion gap: 10 (ref 5–15)
BUN: 23 mg/dL — ABNORMAL HIGH (ref 6–20)
CO2: 21 mmol/L — ABNORMAL LOW (ref 22–32)
Calcium: 8.2 mg/dL — ABNORMAL LOW (ref 8.9–10.3)
Chloride: 105 mmol/L (ref 98–111)
Creatinine, Ser: 2.26 mg/dL — ABNORMAL HIGH (ref 0.44–1.00)
GFR, Estimated: 25 mL/min — ABNORMAL LOW (ref >60)
Glucose, Bld: 192 mg/dL — ABNORMAL HIGH (ref 70–99)
Potassium: 3.1 mmol/L — ABNORMAL LOW (ref 3.5–5.1)
Sodium: 136 mmol/L (ref 135–145)

## 2020-01-10 LAB — CBC
HCT: 26.4 % — ABNORMAL LOW (ref 36.0–46.0)
Hemoglobin: 7.8 g/dL — ABNORMAL LOW (ref 12.0–15.0)
MCH: 27.5 pg (ref 26.0–34.0)
MCHC: 29.5 g/dL — ABNORMAL LOW (ref 30.0–36.0)
MCV: 93 fL (ref 80.0–100.0)
Platelets: 419 10*3/uL — ABNORMAL HIGH (ref 150–400)
RBC: 2.84 MIL/uL — ABNORMAL LOW (ref 3.87–5.11)
RDW: 15.1 % (ref 11.5–15.5)
WBC: 11 10*3/uL — ABNORMAL HIGH (ref 4.0–10.5)
nRBC: 0 % (ref 0.0–0.2)

## 2020-01-10 LAB — GLUCOSE, CAPILLARY
Glucose-Capillary: 172 mg/dL — ABNORMAL HIGH (ref 70–99)
Glucose-Capillary: 196 mg/dL — ABNORMAL HIGH (ref 70–99)
Glucose-Capillary: 169 mg/dL — ABNORMAL HIGH (ref 70–99)
Glucose-Capillary: 202 mg/dL — ABNORMAL HIGH (ref 70–99)

## 2020-01-10 IMAGING — DX DG CHEST 2V
2 series · 2 of 2 positions shown · non-contrast
Comparison: 11/28/2017

CLINICAL DATA: Cough and weakness

EXAM:
CHEST - 2 VIEW

[chest pa]
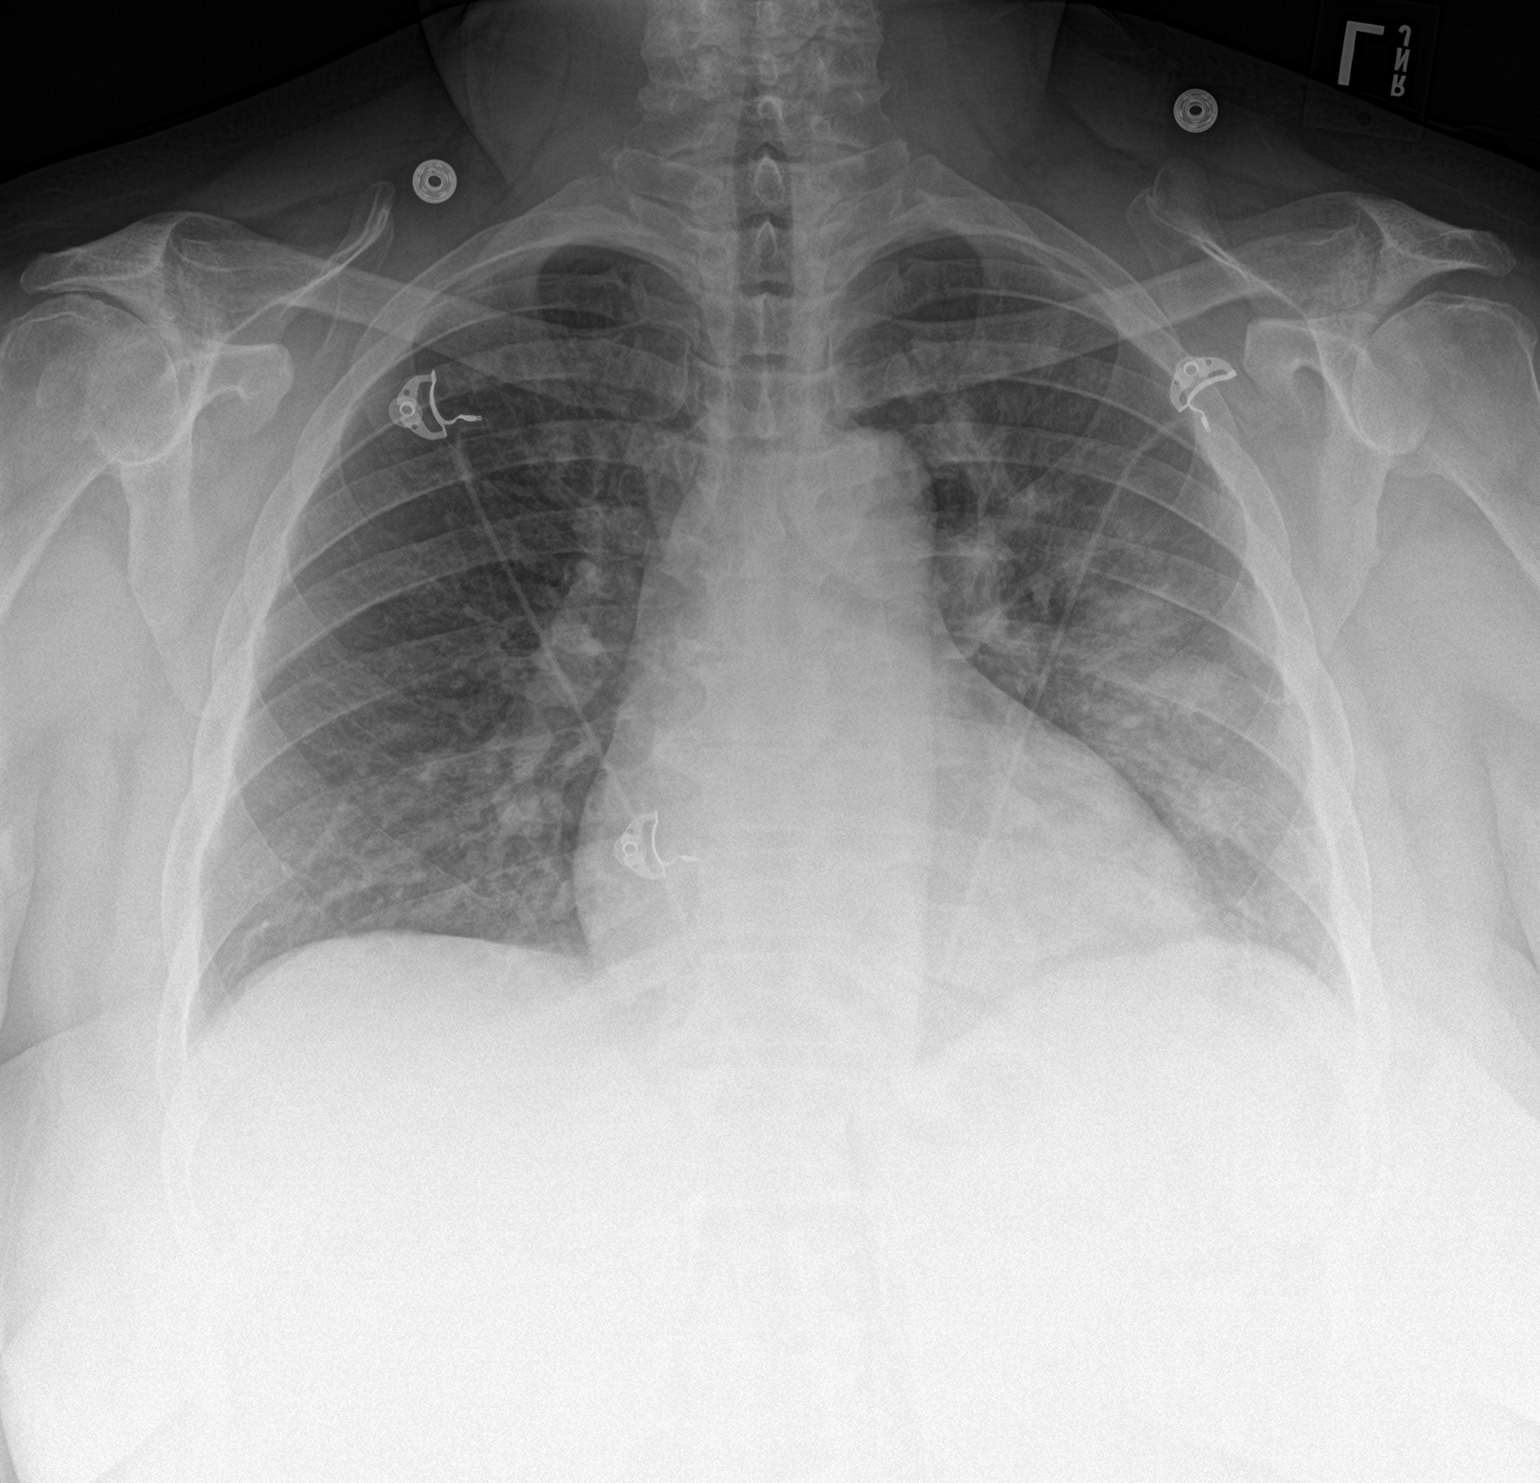

[chest lat]
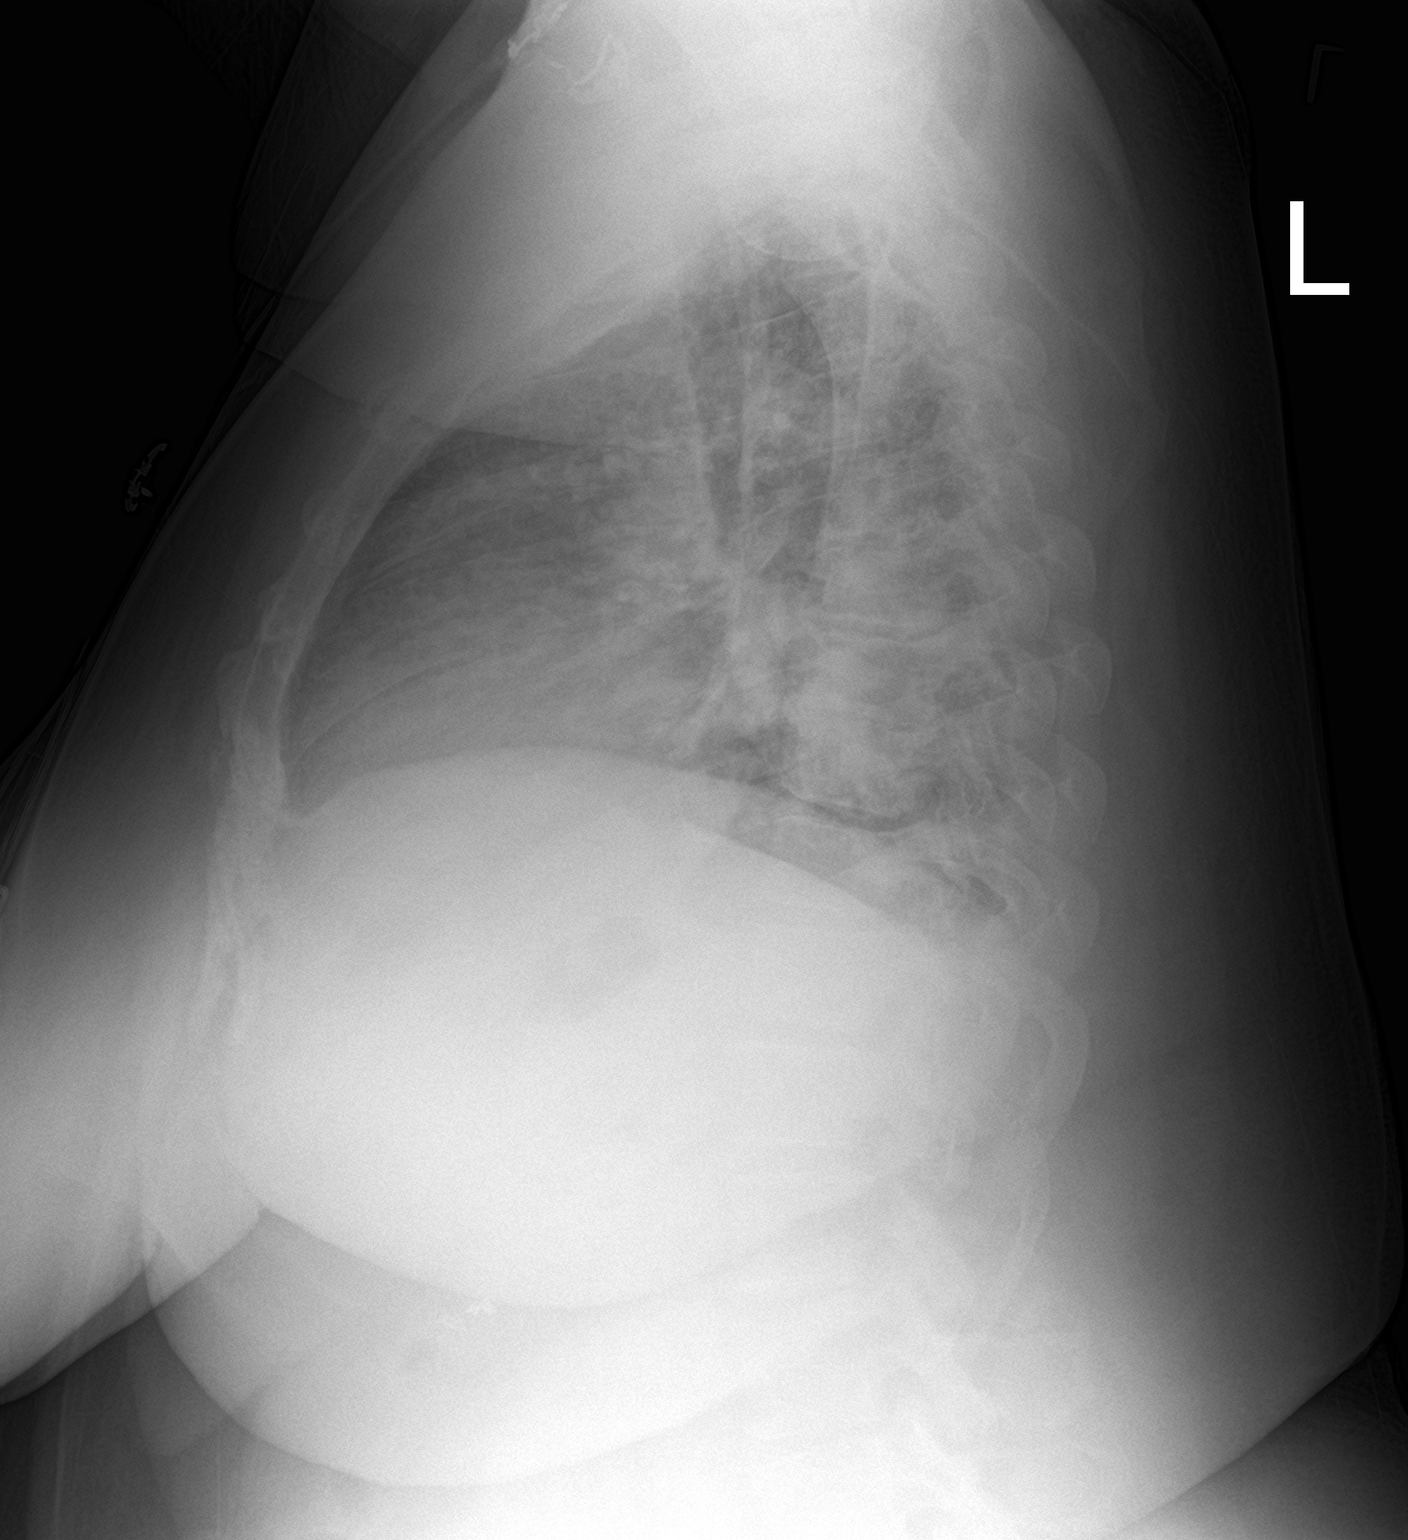

[2 of 2 positions shown; findings below may reference images not displayed]

FINDINGS: Cardiac shadow is mildly enlarged. The lungs are well aerated
bilaterally. Patchy infiltrate is noted projecting in the left lower
lobe on the lateral film. No sizable effusion is seen. No bony
abnormality is noted.
IMPRESSION: Left lower lobe infiltrate new from the prior exam.

## 2020-01-10 MED ORDER — HYDROMORPHONE HCL 1 MG/ML IJ SOLN
1.0000 mg | Freq: Three times a day (TID) | INTRAMUSCULAR | Status: DC | PRN
  Administered 2020-01-11 (×2): 1 mg via INTRAVENOUS
  Filled 2020-01-10 (×2): qty 1

## 2020-01-10 MED ORDER — POTASSIUM CHLORIDE CRYS ER 10 MEQ PO TBCR
40.0000 meq | EXTENDED_RELEASE_TABLET | Freq: Once | ORAL | Status: DC
  Filled 2020-01-10: qty 4

## 2020-01-10 MED ORDER — POTASSIUM CHLORIDE CRYS ER 10 MEQ PO TBCR
40.0000 meq | EXTENDED_RELEASE_TABLET | Freq: Once | ORAL | Status: AC
  Administered 2020-01-10: 40 meq via ORAL
  Filled 2020-01-10: qty 4

## 2020-01-10 MED ORDER — POTASSIUM CHLORIDE CRYS ER 10 MEQ PO TBCR
40.0000 meq | EXTENDED_RELEASE_TABLET | Freq: Once | ORAL | Status: DC
  Filled 2020-01-10: qty 2

## 2020-01-10 MED ORDER — HYDROMORPHONE HCL 1 MG/ML IJ SOLN
1.0000 mg | Freq: Two times a day (BID) | INTRAMUSCULAR | Status: DC | PRN
  Administered 2020-01-10: 1 mg via INTRAVENOUS
  Filled 2020-01-10: qty 1

## 2020-01-10 NOTE — Progress Notes (Signed)
The patient is demanding to get her PRN dilaudid due at 12:33 am now. This RN  Informed the patient  she  cannot give it earlier than the due time but will contact MD to see if they will give her one time dose.  Patient refused her night time medications, stated she's nauseous. She also declined  to take Zofran for N/V, and shift  assessment including  auscultating  her heart and lungs. Now she's endorsing of having panic attack and needed ativan which  none is  scheduled in the [facility]. Called to notify Dr. Rosita Kea and Dr. Ouida Sills about patient's complaint and they indicated they will be coming to see her shortly. Message conveyed to the patient that the doctors will be in shortly. Will continue to monitor.

## 2020-01-10 NOTE — Progress Notes (Signed)
Inpatient Diabetes Program Recommendations  AACE/ADA: New Consensus Statement on Inpatient Glycemic Control (2015)  Target Ranges:  Prepandial:   less than 140 mg/dL      Peak postprandial:   less than 180 mg/dL (1-2 hours)      Critically ill patients:  140 - 180 mg/dL   Lab Results  Component Value Date   GLUCAP 244 (H) 01/09/2020   HGBA1C 8.2 (H) 01/08/2020    Review of Glycemic Control Results for SAHER, DAVEE (MRN [id number]) as of 01/10/2020 07:12  Ref. Range 01/09/2020 11:32 01/09/2020 16:15 01/09/2020 20:57  Glucose-Capillary Latest Ref Range: 70 - 99 mg/dL 277 (H) 214 (H) 244 (H)   Diabetes history:DM Outpatient Diabetes medications:Lantus 50 units + Farxiga 10 mg + Novolog 30 units am,pm + hs Current orders for Inpatient glycemic control:Lantus 20 units + Farxiga 10 mg qd + Moderate tid  Inpatient Diabetes Program Recommendations:  If nausea has improved, consider adding Novolog 3 units TID (assuming patient is consuming >50% of meal) for elevated post prandial values exceeding goal.   Thanks, Bronson Curb, MSN, RNC-OB Diabetes Coordinator [phone] (8a-5p)

## 2020-01-10 NOTE — Progress Notes (Signed)
Patient refused to take hydralazine. Educated patient on importance of medication. Continued to refuse. MD notified.

## 2020-01-10 NOTE — Progress Notes (Signed)
Dr. Ouida Sills notified of patient's refusal of the Potassium. She stated they'll order  a different kind for her.

## 2020-01-10 NOTE — Progress Notes (Signed)
Patient is noncompliant with her cardiac monitor and keep on taking it off. This RN has educated her on the rational for wearing the monitor. Dr. Ouida Sills notified without any new orders. Will continue to monitor.

## 2020-01-10 NOTE — Progress Notes (Addendum)
INTERIM PROGRESS NOTE: Pt is seen by Dr. Ouida Sills and me. Pt was lying down comfortably and breathing normally before we entered her room. Pt started breathing fast and complained of severe abdominal pain. She demanded IV dilaudid and something for anxiety. Pt was explained that she can't have IV dilaudid before the next scheduled dose. She still demands dilaudid and states that's the only medication works for her abdominal pain. Pt was explained that she needs to get a CT done whenever they are ready for her. Pt states she can go for CT after IV dilauded next dose at 12.30 am. Plan was discussed with her Nurse Baldo Ash and she agrees with the plan.   Dr. Armando Reichert. MD PGY1 [facility]

## 2020-01-10 NOTE — Progress Notes (Signed)
Family Medicine Teaching Service Daily Progress Note Intern Pager: [phone]  Patient name: Deborah [name] Medical record number: [id number] Date of birth: 1965/08/07 Age: 54 y.o. Gender: female  Primary Care Provider: Cleophas Dunker, DO Consultants: none Code Status: Full  Pt Overview and Major Events to Date:  Admitted 11/2  Assessment and Plan:  Acute on chronic Abdominal Pain Likely due to Gastroparesis.  On oral Dilaudid at home.  Currently on IV Dilaudid 1mg  q8hr.  Continue to wean IV Dilaudid as able and transition to oral. - Decrease IV Dilaudid to q12 PRN - avoiding metoclopramide - zofran for nausea  AKI Previously CKD stage 3b.  Baseline creatinine ~ 1.4.  Down to 1.2 on admission on admission.  Has continued to gradually increase over past 3 days.  Cr now 2.26, BUN-23  BUN:Cr-10.  GFR- 25 - am BMPs - Will obtain Urinalysis - Order urine ultrasound - Consider consulting Nephrology if kidney function continues to worsen   Chest Pain  Ekg w/ no st changes. - continue to monitor.    HFmrEF Previous Echo: 45-50% LVEF.  Global hypokinesis.  Not taking home torsemide for the past week..   - Stopped Lasix due to worsening Creatinine - strict I/Os - bmp's - restarted home meds: farxiga, Imdur, toprol   A fib Pt taking eliquis 5mg  bid at home.  On toprol xl 25mg .  - continue home meds  Diabetes type 2 At home she takes Lantus 50 U qhs and novolog 30mg  TID w/ meals - lantus 20 U  - mSSI  Fibromyalgia  on duloxetine at home.   - continue home med  Anxiety Home medications include buspar and xanax prn.  Pt states she rarely takes her xanax.  - continue buspar - holding xanax  FEN/GI: Full PPx: On Eliquis 5 mg BID   Status is: Inpatient  Remains inpatient appropriate because:Ongoing active pain requiring inpatient pain management   Dispo: The patient is from: Home              Anticipated d/c is to: Home              Anticipated d/c  date is: 1 day              Patient currently is not medically stable to d/c.     Subjective:  Patient indicates she feels she is still hving significant abdominal pain and continues to need IV Dilaudid.  Indicates she is unable to eat anything or keep anything down but taking other medications PO.  Objective: Temp:  [97.2 F (36.2 C)-97.8 F (36.6 C)] 97.6 F (36.4 C) (11/04 1206) Pulse Rate:  [87-97] 92 (11/04 1206) Resp:  [17-27] 17 (11/04 1206) BP: (105-153)/(61-90) 133/82 (11/04 1206) SpO2:  [96 %-100 %] 100 % (11/04 1206) Weight:  [155.6 kg] 155.6 kg (11/04 0517)  Physical Exam: Physical Exam Constitutional:      General: She is not in acute distress.    Appearance: She is not ill-appearing.  HENT:     Head: Normocephalic and atraumatic.  Cardiovascular:     Rate and Rhythm: Normal rate and regular rhythm.     Heart sounds: Normal heart sounds.  Pulmonary:     Effort: Pulmonary effort is normal.     Breath sounds: Normal breath sounds.  Abdominal:     Palpations: Abdomen is soft.     Tenderness: There is generalized abdominal tenderness.  Skin:    General: Skin is warm.  Neurological:  Mental Status: She is alert.  Psychiatric:        Mood and Affect: Mood normal.        Behavior: Behavior normal.    Laboratory: Recent Labs  Lab 01/08/20 1220 01/09/20 0238 01/10/20 0250  WBC 12.8* 11.3* 11.0*  HGB 8.6* 8.0* 7.8*  HCT 29.0* 27.4* 26.4*  PLT 554* 494* 419*   Recent Labs  Lab 01/07/20 1807 01/07/20 1807 01/08/20 1220 01/08/20 1220 01/09/20 0238 01/09/20 1503 01/10/20 0250  NA 139   < > 138   < > 137 135 136  K 3.4*   < > 3.5   < > 2.9* 3.1* 3.1*  CL 105   < > 103   < > 101 99 105  CO2 21*   < > 23   < > 24 24 21*  BUN 12   < > 15   < > 15 17 23*  CREATININE 1.22*   < > 1.46*   < > 1.56* 2.05* 2.26*  CALCIUM 8.3*   < > 8.2*   < > 8.2* 8.4* 8.2*  PROT 7.3  --  7.2  --   --   --   --   BILITOT 0.7  --  0.5  --   --   --   --   ALKPHOS 99   --  96  --   --   --   --   ALT 14  --  14  --   --   --   --   AST 18  --  17  --   --   --   --   GLUCOSE 219*   < > 322*   < > 215* 222* 192*   < > = values in this interval not displayed.    Imaging/Diagnostic Tests: No new imaging  Delora Fuel, MD 01/10/2020, 3:26 PM PGY-1, [place] Intern pager: [phone], text pages welcome

## 2020-01-10 NOTE — Progress Notes (Signed)
Patient refused to take potassium stated she was too nausea. Offered patient Zofran and scopolamine patch for nausea. Patient refused. MD notified. No new orders at this time.

## 2020-01-10 NOTE — Progress Notes (Signed)
Patient refused the potassium po ordered for her. Stated, I  normally take the yellow one and demanded to change it. Pharmacist notify to look into it and change it.

## 2020-01-10 NOTE — Progress Notes (Signed)
FPTS Interim Progress Note  S: Called to patient's room from nurse reporting patient was very upset regarding her pain medicine frequency being changed.  She reported that IV Dilaudid is the only thing that will help with her pain and that she needs it at minimum every 8 hours.  Reports that she is having nausea and has been unable to eat anything.  I asked about why she was refusing the Zofran or scopolamine and she reports that she has not.  It is clearly documented on nursing notes that she has been declining her Zofran and scopolamine.  Patient reports that "gastroparesis is treated with IV pain meds all the time".  Patient ultimately agrees to take scopolamine/Zofran for the nausea but is still adamant that she needs IV pain medicines.  Request to speak to attending doctor.  Patient intermittently yelling throughout the interaction.  O: BP 133/82 (BP Location: Right Wrist)   Pulse 92   Temp 97.6 F (36.4 C) (Oral)   Resp 17   Ht 5\' 6"  (1.676 m)   Wt (!) 155.6 kg   LMP 10/10/2012   SpO2 100%   BMI 55.37 kg/m   General: Quietly resting when I enter the room but immediately becomes distraught Cardiac: Heart rate in the 90s when I enter the room, fluctuates between 90s and 100s throughout conversation Respiratory: Normal work of breathing, speaking in clear sentences without shortness of breath Abdomen: Positive bowel sounds on auscultation although difficult to hear due to patient's talking.  Palpate while auscultating and patient does not respond to this.  After auscultation I palpate and patient yells with each palpation.  A/P: Gastroparesis -Continue to work to wean IV pain medicines and transition to p.o. -Continue symptomatic management of nausea and vomiting with Zofran and scopolamine -Informed attending and she will be following up with patient regarding changes in management  Gifford Shave, MD 01/10/2020, 3:46 PM PGY-2, [place] [facility] Service pager [phone]

## 2020-01-10 NOTE — Progress Notes (Signed)
CT has called multiple times to pick patient up for the imaging. Patient refused to go get the CT scan.

## 2020-01-10 NOTE — Progress Notes (Signed)
1441: Patient new dilaudid orders are every 12 hours. Patient c/o pain 10/10 informed she could not have another dose until 1700. Patient became irate demanding to speak with the MD. Paged MD awaiting return call.   1540: MD in to see pt. No new orders given at this time.

## 2020-01-11 ENCOUNTER — Inpatient Hospital Stay (HOSPITAL_COMMUNITY): Payer: Medicare Other

## 2020-01-11 DIAGNOSIS — R109 Unspecified abdominal pain: Secondary | ICD-10-CM | POA: Diagnosis not present

## 2020-01-11 LAB — CBC WITH DIFFERENTIAL/PLATELET
Abs Immature Granulocytes: 0.12 10*3/uL — ABNORMAL HIGH (ref 0.00–0.07)
Basophils Absolute: 0 10*3/uL (ref 0.0–0.1)
Basophils Relative: 0 %
Eosinophils Absolute: 0 10*3/uL (ref 0.0–0.5)
Eosinophils Relative: 0 %
HCT: 24 % — ABNORMAL LOW (ref 36.0–46.0)
Hemoglobin: 7.2 g/dL — ABNORMAL LOW (ref 12.0–15.0)
Immature Granulocytes: 1 %
Lymphocytes Relative: 8 %
Lymphs Abs: 1.3 10*3/uL (ref 0.7–4.0)
MCH: 27.1 pg (ref 26.0–34.0)
MCHC: 30 g/dL (ref 30.0–36.0)
MCV: 90.2 fL (ref 80.0–100.0)
Monocytes Absolute: 0.6 10*3/uL (ref 0.1–1.0)
Monocytes Relative: 3 %
Neutro Abs: 14.5 10*3/uL — ABNORMAL HIGH (ref 1.7–7.7)
Neutrophils Relative %: 88 %
Platelets: 404 10*3/uL — ABNORMAL HIGH (ref 150–400)
RBC: 2.66 MIL/uL — ABNORMAL LOW (ref 3.87–5.11)
RDW: 15.5 % (ref 11.5–15.5)
WBC: 16.6 10*3/uL — ABNORMAL HIGH (ref 4.0–10.5)
nRBC: 0 % (ref 0.0–0.2)

## 2020-01-11 LAB — BASIC METABOLIC PANEL
Anion gap: 12 (ref 5–15)
BUN: 17 mg/dL (ref 6–20)
CO2: 20 mmol/L — ABNORMAL LOW (ref 22–32)
Calcium: 8.7 mg/dL — ABNORMAL LOW (ref 8.9–10.3)
Chloride: 104 mmol/L (ref 98–111)
Creatinine, Ser: 1.72 mg/dL — ABNORMAL HIGH (ref 0.44–1.00)
GFR, Estimated: 35 mL/min — ABNORMAL LOW (ref >60)
Glucose, Bld: 239 mg/dL — ABNORMAL HIGH (ref 70–99)
Potassium: 3.4 mmol/L — ABNORMAL LOW (ref 3.5–5.1)
Sodium: 136 mmol/L (ref 135–145)

## 2020-01-11 LAB — GLUCOSE, CAPILLARY
Glucose-Capillary: 192 mg/dL — ABNORMAL HIGH (ref 70–99)
Glucose-Capillary: 233 mg/dL — ABNORMAL HIGH (ref 70–99)
Glucose-Capillary: 339 mg/dL — ABNORMAL HIGH (ref 70–99)
Glucose-Capillary: 204 mg/dL — ABNORMAL HIGH (ref 70–99)

## 2020-01-11 LAB — IRON AND TIBC
Iron: 10 ug/dL — ABNORMAL LOW (ref 28–170)
Saturation Ratios: 3 % — ABNORMAL LOW (ref 10.4–31.8)
TIBC: 377 ug/dL (ref 250–450)
UIBC: 367 ug/dL

## 2020-01-11 LAB — LACTIC ACID, PLASMA
Lactic Acid, Venous: 2.6 mmol/L (ref 0.5–1.9)
Lactic Acid, Venous: 1.8 mmol/L (ref 0.5–1.9)

## 2020-01-11 LAB — FERRITIN: Ferritin: 93 ng/mL (ref 11–307)

## 2020-01-11 MED ORDER — SODIUM CHLORIDE 0.9 % IV SOLN
2.0000 g | INTRAVENOUS | Status: DC
  Administered 2020-01-11 – 2020-01-12 (×2): 2 g via INTRAVENOUS
  Filled 2020-01-11 (×2): qty 2
  Filled 2020-01-11: qty 20

## 2020-01-11 MED ORDER — SODIUM CHLORIDE 0.9 % IV BOLUS
500.0000 mL | Freq: Once | INTRAVENOUS | Status: AC
  Administered 2020-01-11: 500 mL via INTRAVENOUS

## 2020-01-11 MED ORDER — METRONIDAZOLE IN NACL 5-0.79 MG/ML-% IV SOLN
500.0000 mg | Freq: Three times a day (TID) | INTRAVENOUS | Status: DC
  Administered 2020-01-11 – 2020-01-13 (×6): 500 mg via INTRAVENOUS
  Filled 2020-01-11 (×6): qty 100

## 2020-01-11 MED ORDER — HYDROMORPHONE HCL 1 MG/ML IJ SOLN
1.0000 mg | Freq: Two times a day (BID) | INTRAMUSCULAR | Status: DC | PRN
Start: 2020-01-11 — End: 2020-01-11

## 2020-01-11 MED ORDER — HYDROMORPHONE HCL 1 MG/ML IJ SOLN
1.0000 mg | Freq: Three times a day (TID) | INTRAMUSCULAR | Status: DC | PRN
  Administered 2020-01-11 – 2020-01-14 (×8): 1 mg via INTRAVENOUS
  Filled 2020-01-11 (×8): qty 1

## 2020-01-11 MED ORDER — LOPERAMIDE HCL 2 MG PO CAPS
2.0000 mg | ORAL_CAPSULE | ORAL | Status: DC | PRN
  Administered 2020-01-11 – 2020-01-12 (×2): 2 mg via ORAL
  Filled 2020-01-11 (×2): qty 1

## 2020-01-11 NOTE — Evaluation (Signed)
Occupational Therapy Evaluation Patient Details Name: Deborah [name] MRN: [id number] DOB: Mar 30, 1965 Today's Date: 01/11/2020    History of Present Illness The pt is a 54 yo female presenting with worsening abdominal pain with nausea and vomiting. During admission, pt undergoing further workup of abdominal pain, complicated by signficant pain levels. PMH includes: chronic back pain, CHF, DM II, HLD, HTN, obesity, afib, and stroke.    Clinical Impression   Pt admitted with the above diagnoses and presents with below problem list. Pt will benefit from continued acute OT to address the below listed deficits and maximize independence with basic ADLs. Bed level eval as pt ultimately declined any movement citing pain. Pt states pain is in BLE and abdomen, 9 at rest, 10-12 in standing. Pt reports pain has been at this level for a few days. Pt also noted to be a bit sleepy, answering questions but eyes closed throughout session. Per chart review, conversation with PT, and clinical judgement pt is supervision level to complete SPT EOB to [facility], setup for UB ADLs, up to min A with LB ADLs. Discussed with pt goal of getting up to recliner next session.       Follow Up Recommendations  Home health OT;Supervision - Intermittent    Equipment Recommendations  None recommended by OT    Recommendations for Other Services       Precautions / Restrictions Precautions Precautions: None Precaution Comments: low fall Restrictions Weight Bearing Restrictions: No      Mobility Bed Mobility Overal bed mobility: Modified Independent             General bed mobility comments: Per PT note: increased time and use of bed rails, no assist needed    Transfers Overall transfer level: Modified independent Equipment used: None             General transfer comment: mod I for stand-pivot transfers EOB<>BSC during PT session. Pt declined movement this session.     Balance Overall balance  assessment: Mild deficits observed, not formally tested                                         ADL either performed or assessed with clinical judgement   ADL Overall ADL's : Needs assistance/impaired Eating/Feeding: Set up;Sitting;Bed level   Grooming: Sitting;Set up   Upper Body Bathing: Sitting;Set up;Minimal assistance   Lower Body Bathing: Sit to/from stand;Minimal assistance   Upper Body Dressing : Sitting;Minimal assistance   Lower Body Dressing: Sit to/from stand;Sitting/lateral leans;Minimal assistance   Toilet Transfer: Min guard;Stand-pivot;BSC;Supervision/safety             General ADL Comments: Pt states she is unable to tolerate movement 2/2 pain.  Seen in sidelying position. Pt reports pain is contant and has been going on for a few days. Per PT note and conversation with PT, pt able to get on and off BSC with no physical assist. does seek UE support of rails/armrest.      Vision         Perception     Praxis      Pertinent Vitals/Pain Pain Assessment: 0-10 Pain Score: 10-Worst pain ever Faces Pain Scale: Hurts even more Pain Location: BLE at rest, abdomen. pt reports worsens in standing "11 or 12 then" Pt sleepy during session, eyes closed.  Pain Descriptors / Indicators: Discomfort;Grimacing;Sharp Pain Intervention(s): Limited activity within patient's tolerance;Monitored during  session     Hand Dominance Right   Extremity/Trunk Assessment Upper Extremity Assessment Upper Extremity Assessment: Generalized weakness   Lower Extremity Assessment Lower Extremity Assessment: Defer to PT evaluation   Cervical / Trunk Assessment Cervical / Trunk Assessment: Other exceptions Cervical / Trunk Exceptions: large body habitus   Communication Communication Communication: No difficulties   Cognition Arousal/Alertness: Awake/alert Behavior During Therapy: WFL for tasks assessed/performed;Flat affect Overall Cognitive Status: Within  Functional Limits for tasks assessed                                 General Comments: Pt able to answer all history and background questions without issue, able to demo good line awareness and safety with transfers to [facility]. Restless through eval when discussing movement and pain, but resting comfortably when PT leaves room.    General Comments  BP stable, HR 100 bpm prior to and after PT entering room, HR 110-120 while PT in room. SpO2 100% on 2L O2, pt reports she uses 3-4 L at home    Exercises     Shoulder Instructions      Home Living Family/patient expects to be discharged to:: Private residence Living Arrangements: Alone Available Help at Discharge: Family;Available PRN/intermittently (aide 2-3 hours each day) Type of Home: Apartment Home Access: Level entry     Home Layout: One level     Bathroom Shower/Tub: Teacher, early years/pre: Standard     Home Equipment: Bedside commode;Shower seat;Wheelchair - [employer] - 2 wheels;Tub bench;Walker - 4 wheels   Additional Comments: pt uses wash up at sink with aide rather than shower and BSC rather than toilet      Prior Functioning/Environment Level of Independence: Needs assistance  Gait / Transfers Assistance Needed: pt reports independence with transfer to [facility], able to walk a few steps if needed.  ADL's / Homemaking Assistance Needed: aide supervises with wash up at sink, pt able to use [facility] independently with assist to empty   Comments: aide also assists with groceries and errands        OT Problem List: Impaired balance (sitting and/or standing);Decreased activity tolerance;Decreased strength;Decreased knowledge of use of DME or AE;Decreased knowledge of precautions;Pain;Obesity      OT Treatment/Interventions: Self-care/ADL training;Therapeutic exercise;Energy conservation;DME and/or AE instruction;Therapeutic activities;Patient/family education;Balance training    OT Goals(Current goals  can be found in the care plan section) Acute Rehab OT Goals Patient Stated Goal: reduce pain OT Goal Formulation: With patient Time For Goal Achievement: 01/25/20 Potential to Achieve Goals: Good ADL Goals Pt Will Perform Lower Body Bathing: with min assist;sit to/from stand;sitting/lateral leans Pt Will Perform Lower Body Dressing: with min assist;sit to/from stand;sitting/lateral leans Pt Will Transfer to Toilet: with modified independence;stand pivot transfer Pt Will Perform Toileting - Clothing Manipulation and hygiene: with modified independence;sitting/lateral leans;sit to/from stand  OT Frequency: Min 2X/week   Barriers to D/C:            Co-evaluation              AM-PAC OT "6 Clicks" Daily Activity     Outcome Measure Help from another person eating meals?: None Help from another person taking care of personal grooming?: A Little Help from another person toileting, which includes using toliet, bedpan, or urinal?: A Little Help from another person bathing (including washing, rinsing, drying)?: A Lot Help from another person to put on and taking off regular upper body  clothing?: A Little Help from another person to put on and taking off regular lower body clothing?: A Lot 6 Click Score: 17   End of Session    Activity Tolerance: Patient limited by pain;Other (comment) (per pt report) Patient left: in bed;with call bell/phone within reach  OT Visit Diagnosis: Unsteadiness on feet (R26.81);Muscle weakness (generalized) (M62.81);Pain                Time: 5790-3833 OT Time Calculation (min): 9 min Charges:  OT General Charges $OT Visit: 1 Visit OT Evaluation $OT Eval Low Complexity: [place], OT Acute Rehabilitation Services Pager: [phone] Office: [phone]  Hortencia Pilar 01/11/2020, 11:40 AM

## 2020-01-11 NOTE — Progress Notes (Signed)
Interim progress note  Spoke with pharmacy team regarding patient's antibiotics.  Ciprofloxacin and Flagyl were ordered to cover for an intra-abdominal source of infection given patient's recent fevers, lactic acidosis, and continued abdominal pain.  Due to local antibiotic resistance to fluoroquinolones, as well as the black box warnings associated with fluoroquinolones, pharmacy would prefer to use ceftriaxone instead of Cipro, will plan to do Flagyl.  This was also discussed with the attending, who agrees with this decision as long as there is reassurance that it will cover for intra-abdominal sources of infection.  Pharmacy was reassuring, and will place order for ceftriaxone.  Milus Banister, [place], PGY-3 01/11/2020 8:11 PM

## 2020-01-11 NOTE — Progress Notes (Signed)
Received orders to transfer to 6N18. Patient has been informed and all belonging packed.

## 2020-01-11 NOTE — Progress Notes (Signed)
Patient stated she's allergic to contrast medium. Dr. Ouida Sills notified.

## 2020-01-11 NOTE — Evaluation (Signed)
Physical Therapy Evaluation Patient Details Name: Deborah [name] MRN: [id number] DOB: 07/06/1965 Today's Date: 01/11/2020   History of Present Illness  The pt is a 54 yo female presenting with worsening abdominal pain with nausea and vomiting. During admission, pt undergoing further workup of abdominal pain, complicated by signficant pain levels. PMH includes: chronic back pain, CHF, DM II, HLD, HTN, obesity, afib, and stroke.     Clinical Impression  Pt in bed upon arrival of PT, agreeable to evaluation at this time. Prior to admission the pt was living home alone, using a WC for all mobility, but was able to transfer independently. The pt reports she has an aide that comes for 2-3 hours daily to assist with ADLs and home care. The pt now presents with minor limitations in functional mobility due to pain, but was able to demonstrate multiple bouts of bed mobility and multiple OOB transfers without assist or use of AD. The pt reports she feels like she is at her mobility baseline, except for addition of abdominal pain. The pt was educated on safety with transfers, and applied concepts to mobility well during session. The pt states she has no concerns with mobility or safety following return home at this time, her only concern is pain management. The pt has no further acute PT needs at this time, thank you for the consult.       Follow Up Recommendations No PT follow up;Supervision - Intermittent    Equipment Recommendations  None recommended by PT (pt well equipt)    Recommendations for Other Services       Precautions / Restrictions Precautions Precautions: None Precaution Comments: low fall Restrictions Weight Bearing Restrictions: No      Mobility  Bed Mobility Overal bed mobility: Modified Independent             General bed mobility comments: increased time and use of bed rails, no assist needed    Transfers Overall transfer level: Modified independent Equipment  used: None             General transfer comment: pt reaching for UE support from [facility], but able to power up without assist. slightly increased time  Ambulation/Gait Ambulation/Gait assistance: Supervision Gait Distance (Feet): 3 Feet Assistive device: None Gait Pattern/deviations: Step-to pattern;Trunk flexed;Wide base of support     General Gait Details: wide shuffle step to [facility] from bed, pt with UE support on BSC as she stepped to the commode, declined further ambulation at this tiem  Stairs            Wheelchair Mobility    Modified Rankin (Stroke Patients Only)       Balance Overall balance assessment: Mild deficits observed, not formally tested                                           Pertinent Vitals/Pain Pain Assessment: 0-10 Pain Score: 10-Worst pain ever Faces Pain Scale: Hurts even more Pain Location: pt reporting pain everywhere when asked directly, minimal grimacing with all movement, not equal to reported pain levels as pt was able to carry on conversation and completed multiple transfers OOB of her own volition without complaint Pain Descriptors / Indicators: Discomfort;Grimacing;Sharp Pain Intervention(s): Monitored during session;Patient requesting pain meds-RN notified;Repositioned    Home Living Family/patient expects to be discharged to:: Private residence Living Arrangements: Alone Available Help at Discharge: Family;Available PRN/intermittently (  aide 2-3 hours each day) Type of Home: Apartment Home Access: Level entry     Home Layout: One level Home Equipment: Bedside commode;Shower seat;Wheelchair - [employer] - 2 wheels;Tub bench;Walker - 4 wheels Additional Comments: pt uses wash up at sink with aide rather than shower and BSC rather than toilet    Prior Function Level of Independence: Needs assistance   Gait / Transfers Assistance Needed: pt reports independence with transfer to [facility], able to walk a few steps if  needed.   ADL's / Homemaking Assistance Needed: aide supervises with wash up at sink, pt able to use [facility] independently with assist to empty  Comments: aide also assists with groceries and errands     Hand Dominance   Dominant Hand: Right    Extremity/Trunk Assessment   Upper Extremity Assessment Upper Extremity Assessment: Defer to OT evaluation    Lower Extremity Assessment Lower Extremity Assessment: Overall WFL for tasks assessed (pt reporting 10/10 pain in BLE, able to move functionally)    Cervical / Trunk Assessment Cervical / Trunk Assessment: Other exceptions Cervical / Trunk Exceptions: large body habitus  Communication   Communication: No difficulties  Cognition Arousal/Alertness: Awake/alert Behavior During Therapy: Restless Overall Cognitive Status: Within Functional Limits for tasks assessed                                 General Comments: Pt able to answer all history and background questions without issue, able to demo good line awareness and safety with transfers to [facility]. Restless through eval when discussing movement and pain, but resting comfortably when PT leaves room.       General Comments General comments (skin integrity, edema, etc.): BP stable, HR 100 bpm prior to and after PT entering room, HR 110-120 while PT in room. SpO2 100% on 2L O2, pt reports she uses 3-4 L at home    Exercises     Assessment/Plan    PT Assessment Patent does not need any further PT services  PT Problem List         PT Treatment Interventions      PT Goals (Current goals can be found in the Care Plan section)  Acute Rehab PT Goals Patient Stated Goal: reduce pain PT Goal Formulation: With patient Time For Goal Achievement: 01/25/20 Potential to Achieve Goals: Fair    Frequency     Barriers to discharge        Co-evaluation               AM-PAC PT "6 Clicks" Mobility  Outcome Measure Help needed turning from your back to your side  while in a flat bed without using bedrails?: None Help needed moving from lying on your back to sitting on the side of a flat bed without using bedrails?: None Help needed moving to and from a bed to a chair (including a wheelchair)?: None Help needed standing up from a chair using your arms (e.g., wheelchair or bedside chair)?: None Help needed to walk in hospital room?: A Little Help needed climbing 3-5 steps with a railing? : A Lot 6 Click Score: 21    End of Session Equipment Utilized During Treatment: Oxygen Activity Tolerance: Patient tolerated treatment well;Patient limited by pain Patient left: in bed;with call bell/phone within reach Nurse Communication: Mobility status PT Visit Diagnosis: Pain Pain - part of body:  (back, BLE)    Time: 3570-1779 PT Time Calculation (min) (  ACUTE ONLY): 19 min   Charges:   PT Evaluation $PT Eval Low Complexity: 1 Low          Karma Ganja, PT, DPT   Acute Rehabilitation Department Pager #: [phone]  Otho Bellows 01/11/2020, 9:17 AM

## 2020-01-11 NOTE — Progress Notes (Signed)
Report called to Sarah RN 6N. Patient was educated on reason for transfer. All belongings packed and in hand.

## 2020-01-11 NOTE — Progress Notes (Addendum)
INTERIM PROGRESS NOTE  Patient received her IV Dilaudid. RN Baldo Ash asked the patient to consume the PO Contrast for the CT scan, patient said "no, I'm allergic to contrast". Patient has documented allergies to IV contrast, however she has tolerated PO Contrast for CT Abdomen/Pelvis as recently as September 2021.   Contacted pharmacist Marya Amsler regarding the patient's concern. It appears the patient's previous CT scans in September and June were without IV or PO contrast. As discussion was inconclusive I contacted the CT group in the ED, they recommended doing prep with prednisone and benadryl, or contacting [facility].   I spoke with Dr. Ardeen Garland who stated that PO contrast is not absorbed and therefore should not cause an allergic reaction, unlike the IV contrast. We could also do prep and then give the PO contrast if the patient is concerned, or just do the CT scan without any contrast.   I contacted [facility], says patient insists on doing CT scan w/o PO contrast. Order changed.  I strongly recommend doing prep with Prednisone and Benadryl in the future if patient has another episode of abdominal pain requiring CT abdomen/pelvis.   Milus Banister, [place], PGY-3 01/11/2020 1:51 AM

## 2020-01-11 NOTE — Progress Notes (Addendum)
Patient has increasing lactic acid with continued abdominal pain.  Discussed case with attending and we are going to repeat a lactic acid as well as start Cipro/Flagyl.  Paged general surgery team to discuss the case and given no abnormal CT findings from the initial CT they recommended GI consult first and are open to evaluate after.  GI consulted and spoke with [facility] on-call physician.  Discussed the case and he recommended repeating liver enzyme tests including lipase.  He recommended that if the patient had her gallbladder we should get a right upper quadrant ultrasound to evaluate for cholecystitis but upon chart review patient is s/p cholecystectomy.  He feels that any signs of ischemic bowel would most likely have shown up on the CT even though it was not with contrast.  We will continue to monitor overnight and he will see the patient in the morning.  Morning CMP, CBC, lipase ordered.

## 2020-01-11 NOTE — Progress Notes (Addendum)
Family Medicine Teaching Service Daily Progress Note Intern Pager: [phone]  Patient name: Deborah [name] Medical record number: [id number] Date of birth: [birth year] Age: 54 y.o. Gender: female  Primary Care Provider: Cleophas Dunker, DO Consultants: None Code Status: Full  Pt Overview and Major Events to Date:  Admitted 11/2  Assessment and Plan: Deborah [name] a 54 y.o.femalepresenting with 2 days of abdominal pain. Her previous medical history is significant for A. fib, diabetes type 2, gastroparesis, carcinoid tumor of the duodenum with recent surgery.  Acute on chronic Abdominal Pain Likely due to Gastroparesis.  On oral Dilaudid at home.  Currently on IV Dilaudid 1mg  q8hr.  Continue to wean IV Dilaudid as able and transition to oral.  - Continue Dilaudid q8hr, team to discuss weaning down Dilaudid - avoiding metoclopramide - Scopolamine patch for nausea  Sepsis r/o Patient had T of 100.8 tonight.  Repeat T this morning was afebrile and repeat 1 hour later also afebrile.  BP currently stable and patient slightly tachycardic in low 100's.  Mentating appropriately.  No other concerning findings for sepsis. - Obtain UA, UCx and BCx - Obtain Chest X-Ray  AKI Previously CKD stage 3b.  Baseline creatinine ~ 1.4. Down to 1.2 on admission on admission.  Has continued to gradually increase over past 3 days.  Cr now 2.26, BUN-23  BUN:Cr-10.  GFR- 25 - am BMPs - Will obtain Urinalysis - Order urine ultrasound - Consider consulting Nephrology if kidney function continues to worsen   Chest Pain  Ekg w/ no st changes. - continue to monitor.   HFmrEF Previous Echo:45-50% LVEF. Global hypokinesis. Not taking home torsemide for the past week..  - Stopped Lasix due to worsening Creatinine - strict I/Os - bmp's -restarted home meds:farxiga,Imdur, toprol   A fib Pt taking eliquis 5mg  bid at home. On toprol xl 25mg .  - continue home  meds  Diabetes type 2 At home she takes Lantus 50 U qhs and novolog 30mg  TID w/ meals - lantus 20 U  - mSSI  Fibromyalgia on duloxetine at home.  - continue home med  Anxiety Home medications include buspar and xanax prn. Pt states she rarely takes her xanax.  - continue buspar - holding xanax  FEN/GI: Full PPx: On Eliquis 5 mg BID   Status is: Inpatient  Remains inpatient appropriate because:Ongoing diagnostic testing needed not appropriate for outpatient work up   Dispo: The patient is from: Home              Anticipated d/c is to: Home              Anticipated d/c date is: 1 day              Patient currently is medically stable to d/c.     Subjective:   Patient indicates she feels she is still hving significant abdominal pain and continues to need IV Dilaudid.  Indicates she is unable to eat anything or keep anything down but taking other medications PO.  Objective: Temp:  [97.6 F (36.4 C)-100.8 F (38.2 C)] 98.3 F (36.8 C) (11/05 0900) Pulse Rate:  [92-112] 107 (11/05 0900) Resp:  [15-19] 18 (11/05 0900) BP: (109-149)/(53-91) 109/91 (11/05 0803) SpO2:  [97 %-100 %] 97 % (11/05 0900) Weight:  [151.9 kg] 151.9 kg (11/05 2330) Physical Exam:   Physical Exam Constitutional:      General: She is not in acute distress.    Appearance: She is not ill-appearing.  HENT:  Head: Normocephalic and atraumatic.  Cardiovascular:     Rate and Rhythm: Normal rate and regular rhythm.     Heart sounds: Normal heart sounds.  Pulmonary:     Effort: Pulmonary effort is normal.     Breath sounds: Normal breath sounds.  Abdominal:     Palpations: Abdomen is soft.     Tenderness: There is generalized abdominal tenderness.  Skin:    General: Skin is warm.  Neurological:     Mental Status: She is alert.  Psychiatric:        Mood and Affect: Mood normal.        Behavior: Behavior normal.   Laboratory: Recent Labs  Lab 01/08/20 1220 01/09/20 0238  01/10/20 0250  WBC 12.8* 11.3* 11.0*  HGB 8.6* 8.0* 7.8*  HCT 29.0* 27.4* 26.4*  PLT 554* 494* 419*   Recent Labs  Lab 01/07/20 1807 01/07/20 1807 01/08/20 1220 01/09/20 0238 01/09/20 1503 01/10/20 0250 01/11/20 0038  NA 139   < > 138   < > 135 136 136  K 3.4*   < > 3.5   < > 3.1* 3.1* 3.4*  CL 105   < > 103   < > 99 105 104  CO2 21*   < > 23   < > 24 21* 20*  BUN 12   < > 15   < > 17 23* 17  CREATININE 1.22*   < > 1.46*   < > 2.05* 2.26* 1.72*  CALCIUM 8.3*   < > 8.2*   < > 8.4* 8.2* 8.7*  PROT 7.3  --  7.2  --   --   --   --   BILITOT 0.7  --  0.5  --   --   --   --   ALKPHOS 99  --  96  --   --   --   --   ALT 14  --  14  --   --   --   --   AST 18  --  17  --   --   --   --   GLUCOSE 219*   < > 322*   < > 222* 192* 239*   < > = values in this interval not displayed.     Imaging/Diagnostic Tests: No new imaging  Deborah Fuel, MD 01/11/2020, 12:01 PM PGY-1, [place] Intern pager: [phone], text pages welcome

## 2020-01-12 DIAGNOSIS — R109 Unspecified abdominal pain: Secondary | ICD-10-CM | POA: Diagnosis not present

## 2020-01-12 LAB — COMPREHENSIVE METABOLIC PANEL
ALT: 11 U/L (ref 0–44)
AST: 14 U/L — ABNORMAL LOW (ref 15–41)
Albumin: 2.2 g/dL — ABNORMAL LOW (ref 3.5–5.0)
Alkaline Phosphatase: 78 U/L (ref 38–126)
Anion gap: 9 (ref 5–15)
BUN: 18 mg/dL (ref 6–20)
CO2: 23 mmol/L (ref 22–32)
Calcium: 8.2 mg/dL — ABNORMAL LOW (ref 8.9–10.3)
Chloride: 101 mmol/L (ref 98–111)
Creatinine, Ser: 1.88 mg/dL — ABNORMAL HIGH (ref 0.44–1.00)
GFR, Estimated: 31 mL/min — ABNORMAL LOW (ref >60)
Glucose, Bld: 199 mg/dL — ABNORMAL HIGH (ref 70–99)
Potassium: 2.7 mmol/L — CL (ref 3.5–5.1)
Sodium: 133 mmol/L — ABNORMAL LOW (ref 135–145)
Total Bilirubin: 0.5 mg/dL (ref 0.3–1.2)
Total Protein: 6.1 g/dL — ABNORMAL LOW (ref 6.5–8.1)

## 2020-01-12 LAB — BASIC METABOLIC PANEL
Anion gap: 11 (ref 5–15)
BUN: 21 mg/dL — ABNORMAL HIGH (ref 6–20)
CO2: 18 mmol/L — ABNORMAL LOW (ref 22–32)
Calcium: 8.3 mg/dL — ABNORMAL LOW (ref 8.9–10.3)
Chloride: 101 mmol/L (ref 98–111)
Creatinine, Ser: 1.9 mg/dL — ABNORMAL HIGH (ref 0.44–1.00)
GFR, Estimated: 31 mL/min — ABNORMAL LOW (ref >60)
Glucose, Bld: 297 mg/dL — ABNORMAL HIGH (ref 70–99)
Potassium: 4.2 mmol/L (ref 3.5–5.1)
Sodium: 130 mmol/L — ABNORMAL LOW (ref 135–145)

## 2020-01-12 LAB — LACTIC ACID, PLASMA: Lactic Acid, Venous: 1.4 mmol/L (ref 0.5–1.9)

## 2020-01-12 LAB — GLUCOSE, CAPILLARY
Glucose-Capillary: 196 mg/dL — ABNORMAL HIGH (ref 70–99)
Glucose-Capillary: 231 mg/dL — ABNORMAL HIGH (ref 70–99)
Glucose-Capillary: 253 mg/dL — ABNORMAL HIGH (ref 70–99)
Glucose-Capillary: 231 mg/dL — ABNORMAL HIGH (ref 70–99)

## 2020-01-12 LAB — CBC
HCT: 25.2 % — ABNORMAL LOW (ref 36.0–46.0)
Hemoglobin: 7.7 g/dL — ABNORMAL LOW (ref 12.0–15.0)
MCH: 27.7 pg (ref 26.0–34.0)
MCHC: 30.6 g/dL (ref 30.0–36.0)
MCV: 90.6 fL (ref 80.0–100.0)
Platelets: 407 10*3/uL — ABNORMAL HIGH (ref 150–400)
RBC: 2.78 MIL/uL — ABNORMAL LOW (ref 3.87–5.11)
RDW: 15.3 % (ref 11.5–15.5)
WBC: 12.4 10*3/uL — ABNORMAL HIGH (ref 4.0–10.5)
nRBC: 0 % (ref 0.0–0.2)

## 2020-01-12 LAB — LIPASE, BLOOD: Lipase: 21 U/L (ref 11–51)

## 2020-01-12 MED ORDER — ONDANSETRON HCL 4 MG/2ML IJ SOLN
4.0000 mg | Freq: Once | INTRAMUSCULAR | Status: AC
  Administered 2020-01-12: 4 mg via INTRAVENOUS
  Filled 2020-01-12: qty 2

## 2020-01-12 MED ORDER — POTASSIUM CHLORIDE CRYS ER 20 MEQ PO TBCR
40.0000 meq | EXTENDED_RELEASE_TABLET | Freq: Once | ORAL | Status: AC
  Administered 2020-01-12: 40 meq via ORAL
  Filled 2020-01-12 (×3): qty 2

## 2020-01-12 MED ORDER — POTASSIUM CHLORIDE CRYS ER 20 MEQ PO TBCR
40.0000 meq | EXTENDED_RELEASE_TABLET | Freq: Once | ORAL | Status: AC
  Administered 2020-01-12: 40 meq via ORAL
  Filled 2020-01-12: qty 2

## 2020-01-12 NOTE — Progress Notes (Signed)
Interim progress note   Late entry, acknowledging patient's 9 PM lactic acid is within normal limits at 1.8.  Additionally, patient afebrile. Will continue to monitor.    Milus Banister, [place], PGY-3 01/12/2020 12:48 AM

## 2020-01-12 NOTE — Progress Notes (Signed)
Went to evaluate patient due to call from nurse that the patient was refusing her oral Zofran and request an IV due to nausea.  Patient was pleasant on my encounter and denied other complaints other than her nausea which she states feels similar to her nausea earlier.  Patient states that the Zofran that was IV earlier today did seem to help and she would like another dose of this.  Physical exam: General: Alert and oriented in no apparent distress Heart: Regular rate and rhythm with no murmurs appreciated Lungs: CTA bilaterally, no wheezing  Assessment: 54 year old female with a history of gastroparesis and some ongoing nausea.  Patient endorses this nausea does feel similar her normal nausea.  Patient without other complaints at this time and did have good response from the IV Zofran earlier today.  She states that she is not interested in the oral because she feels she might throw it up. Plan: -Will give 4 mg IV Zofran -If patient requires ongoing regular doses of Zofran would consider rechecking EKG as her previous one had borderline prolonged QTC.

## 2020-01-12 NOTE — Progress Notes (Signed)
Took potassium pills to patient. She said she did not want them right now because they are hard to swallow. I offered to break the pills or dissolve them. She stated that she did not want that. I told her that the potassium in her blood was low and that could cause her heart to stop. She said "I know my potassium is low." She said she would take the pills later. She said she would call when she is ready. I told her I would check back later.

## 2020-01-12 NOTE — Progress Notes (Signed)
Family Medicine Teaching Service Daily Progress Note Intern Pager: [phone]  Patient name: Deborah [name] Medical record number: [id number] Date of birth: May 01, 1965 Age: 54 y.o. Gender: female  Primary Care Provider: Cleophas Dunker, DO Consultants: None Code Status: Full  Pt Overview and Major Events to Date:  Admitted 11/2  Assessment and Plan: Deborah [name] a 54 y.o.femalepresenting with 2 days of abdominal pain. Her previous medical history is significant for A. fib, diabetes type 2, gastroparesis, carcinoid tumor of the duodenum with recent surgery.  Acute on chronic Abdominal Pain Likely due to Gastroparesis.  On oral Dilaudid at home.  Currently on IV Dilaudid 1mg  q8hr.  Continue to wean IV Dilaudid as able and transition to oral.  -GI consulted, appreciate recommendations -Given elevated white count and fever concern for intra-abdominal infection so patient started on ceftriaxone and Flagyl - Continue Dilaudid q8hr, team to discuss weaning down Dilaudid - avoiding metoclopramide - Scopolamine patch for nausea  Sepsis with concern for intra-abdominal infection Patient febrile 11/5 at 100.8.  Fever spontaneously resolved but patient also had elevated WBC at 16 from 11.  Lactic acid checked and was elevated at 2.8.  CXR, blood cultures, urine cultures, UA ordered.  CXR within normal limits, blood cultures no growth at this time.  Patient was given ceftriaxone and Flagyl.  Discussed case with general surgery and they requested GI consult first.  GI consulted.  Patient has remained afebrile overnight and WBCs have improved to 12. -GI consulted, appreciate recommendations -Continue ceftriaxone and Flagyl at this time  Hypokalemia Centimeters 2.7 this morning.  Patient refused potassium supplementation because her throat hurts.  Offered liquid or IV and patient reports he did rather have the pills.  She reports she will take them next time the nurse brings  him. -Afternoon BMP, may require further potassium repletion -40 g of [place] -Morning BMPs  AKI Previously CKD stage 3b.  Baseline creatinine ~ 1.4. Down to 1.2 on admission on admission.    Creatinine improving today to 1.88. -Morning BMPs - Will obtain Urinalysis  - Consider consulting Nephrology if kidney function continues to worsen  Chest Pain  Ekg w/ no st changes. - continue to monitor.   HFmrEF Previous Echo:45-50% LVEF. Global hypokinesis. Not taking home torsemide for the past week..  - Stopped Lasix due to worsening Creatinine - strict I/Os - bmp's -restarted home meds:farxiga,Imdur, toprol   A fib Pt taking eliquis 5mg  bid at home. On toprol xl 25mg .  - continue home meds  Diabetes type 2 At home she takes Lantus 50 U qhs and novolog 30mg  TID w/ meals - lantus 20 U  - mSSI  Fibromyalgia on duloxetine at home.  - continue home med  Anxiety Home medications include buspar and xanax prn. Pt states she rarely takes her xanax.  - continue buspar - holding xanax  FEN/GI: Full PPx: On Eliquis 5 mg BID   Status is: Inpatient  Remains inpatient appropriate because:Ongoing diagnostic testing needed not appropriate for outpatient work up   Dispo: The patient is from: Home              Anticipated d/c is to: Home              Anticipated d/c date is: 1 day              Patient currently is medically stable to d/c.  Subjective:  Patient reports she is still having abdominal pain.  She is still having diarrhea.  She reports that she provided a urine specimen I asked that she provide another and she reports she is okay with this.  I also asked that she take her potassium given last check was 2.7.  She reports her throat hurts but she will take it.  I offered to switch it to a liquid or IV and she said that she would prefer the pills  Objective: Temp:  [97.9 F (36.6 C)-98.3 F (36.8 C)] 98 F (36.7 C) (11/06 0332) Pulse Rate:   [101-110] 107 (11/06 0332) Resp:  [18-19] 18 (11/06 0332) BP: (108-112)/(64-97) 110/92 (11/06 0332) SpO2:  [97 %-99 %] 99 % (11/06 0332) Physical Exam: General: Sleeping when I enter the room, easily awakens HEENT: Atraumatic. Normocephalic.  Cardiac: RRR, no m/r/g Respiratory: CTAB, normal work of breathing Abdomen: soft, diffusely tender to palpation bowel sounds normal Skin: warm and dry, no rashes noted Neuro: alert and oriented   Laboratory: Recent Labs  Lab 01/10/20 0250 01/11/20 1149 01/12/20 0133  WBC 11.0* 16.6* 12.4*  HGB 7.8* 7.2* 7.7*  HCT 26.4* 24.0* 25.2*  PLT 419* 404* 407*   Recent Labs  Lab 01/07/20 1807 01/07/20 1807 01/08/20 1220 01/09/20 0238 01/10/20 0250 01/11/20 0038 01/12/20 0133  NA 139   < > 138   < > 136 136 133*  K 3.4*   < > 3.5   < > 3.1* 3.4* 2.7*  CL 105   < > 103   < > 105 104 101  CO2 21*   < > 23   < > 21* 20* 23  BUN 12   < > 15   < > 23* 17 18  CREATININE 1.22*   < > 1.46*   < > 2.26* 1.72* 1.88*  CALCIUM 8.3*   < > 8.2*   < > 8.2* 8.7* 8.2*  PROT 7.3  --  7.2  --   --   --  6.1*  BILITOT 0.7  --  0.5  --   --   --  0.5  ALKPHOS 99  --  96  --   --   --  78  ALT 14  --  14  --   --   --  11  AST 18  --  17  --   --   --  14*  GLUCOSE 219*   < > 322*   < > 192* 239* 199*   < > = values in this interval not displayed.     Imaging/Diagnostic Tests: No new imaging  Gifford Shave, MD 01/12/2020, 8:31 AM PGY-1, [place] Intern pager: [phone], text pages welcome

## 2020-01-12 NOTE — Progress Notes (Signed)
16:00 Got a page from [employer] that Pt K is low at 2.7. Ordered 40 meq of KLOR. Nurse notified.  Dr. Armando Reichert  MD [place] PGY1

## 2020-01-12 NOTE — Progress Notes (Signed)
CRITICAL VALUE ALERT  Critical Value: K+ 2.7  Date & Time Notied:  01/12/20 0345  Provider Notified: Dr. Rosita Kea  Orders Received/Actions taken:k+ordered

## 2020-01-12 NOTE — Consult Note (Addendum)
Referring Provider: Dr. Ardelia Mems Primary Care Physician:  Cleophas Dunker, DO Primary Gastroenterologist:  Althia Forts  Reason for Consultation:  Abdominal pain; Gastroparesis  HPI: Deborah [name] is a 54 y.o. female with multiple medical problems as stated below seen for a consult due to gastroparesis and abdominal pain. Reports constant diffuse abdominal pain with chronic nausea and intermittent vomiting. Non-contrast CT negative for acute findings. Lactic acid 2.6 on 01/11/20 and repeat 1.8 last night and 1.4 this morning. She reports being on Reglan last year for a long period of time and stopping it due to tremors. She states that Dilaudid is the only thing that helps her abdominal pain. She reports being on PO Dilaudid for 3 years. 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 Duodenal ulcer also seen at that time. Nurse present during my evaluation.  Past Medical History:  Diagnosis Date  . Acute back pain with sciatica, left   . Acute back pain with sciatica, right   . AKI (acute kidney injury) ([place])   . Anemia, unspecified   . Cancer ([place])   . Carcinoid tumor of duodenum   . Chronic pain   . Chronic systolic CHF (congestive heart failure) ([place])   . Diabetes mellitus   . Esophageal reflux   . Fibromyalgia   . Gastric ulcer   . Gastroparesis   . Gout   . Hyperlipidemia   . Hypertension   . Lumbosacral stenosis   . NICM (nonischemic cardiomyopathy) ([place])   . Obesity   . PAF (paroxysmal atrial fibrillation) ([place])   . Stroke ([place]) 02/2011  . Vitamin B12 deficiency anemia     Past Surgical History:  Procedure Laterality Date  . BIOPSY  07/27/2019   Procedure: BIOPSY;  Surgeon: Clarene Essex, MD;  Location: [facility];  Service: Endoscopy;;  . BIOPSY  07/30/2019   Procedure: BIOPSY;  Surgeon: Otis Brace, MD;  Location: [facility];  Service: Gastroenterology;;  . CATARACT EXTRACTION  01/2014  . CHOLECYSTECTOMY    . COLONOSCOPY WITH PROPOFOL  N/A 07/30/2019   Procedure: COLONOSCOPY WITH PROPOFOL;  Surgeon: Otis Brace, MD;  Location: [facility];  Service: Gastroenterology;  Laterality: N/A;  . ESOPHAGOGASTRODUODENOSCOPY N/A 07/27/2019   Procedure: ESOPHAGOGASTRODUODENOSCOPY (EGD);  Surgeon: Clarene Essex, MD;  Location: [facility];  Service: Endoscopy;  Laterality: N/A;  . ESOPHAGOGASTRODUODENOSCOPY (EGD) WITH PROPOFOL N/A 08/02/2019   Procedure: ESOPHAGOGASTRODUODENOSCOPY (EGD) WITH PROPOFOL;  Surgeon: Otis Brace, MD;  Location: [facility];  Service: Gastroenterology;  Laterality: N/A;  . HEMOSTASIS CLIP PLACEMENT  08/02/2019   Procedure: HEMOSTASIS CLIP PLACEMENT;  Surgeon: Otis Brace, MD;  Location: [facility];  Service: Gastroenterology;;  . POLYPECTOMY  07/30/2019   Procedure: POLYPECTOMY;  Surgeon: Otis Brace, MD;  Location: [facility];  Service: Gastroenterology;;  . POLYPECTOMY  08/02/2019   Procedure: POLYPECTOMY;  Surgeon: Otis Brace, MD;  Location: [facility];  Service: Gastroenterology;;    Prior to Admission medications   Medication Sig Start Date End Date Taking? Authorizing Provider  albuterol (PROVENTIL) (2.5 MG/3ML) 0.083% nebulizer solution Take 3 mLs (2.5 mg total) by nebulization every 6 (six) hours as needed for wheezing or shortness of breath. 04/06/19  Yes Scot Jun, FNP  allopurinol (ZYLOPRIM) 100 MG tablet Take 100 mg by mouth daily.   Yes [provider]  ALPRAZolam Duanne Moron) 1 MG tablet Take 1 tablet (1 mg total) by mouth 2 (two) times daily as needed for anxiety. 09/03/19  Yes Lockamy, Timothy, DO  atorvastatin (LIPITOR) 10 MG tablet  TAKE ONE TABLET BY MOUTH ONCE DAILY (AM) Patient taking differently: Take 10 mg by mouth daily.  11/14/19  Yes Meccariello, Bernita Raisin, DO  cetirizine (ZYRTEC) 10 MG tablet TAKE ONE TABLET BY MOUTH ONCE DAILY (AM) Patient taking differently: Take 10 mg by mouth daily.  11/14/19  Yes Meccariello, Bernita Raisin, DO  colchicine 0.6 MG  tablet Take 2 tablets at 1 time (1.2 mg total) and 1 hour later take 1 tablet Patient taking differently: Take 0.6 mg by mouth See admin instructions. Take 2 tablets (1.5m totally) by mouth at 1 time and 1 hour later take 1 tablet (0.6 mg totally) as needed for gout flares 01/04/20  Yes CGifford Shave MD  DULoxetine HCl 40 MG CPEP Take 40 mg by mouth in the morning and at bedtime.   Yes [provider]  ELIQUIS 5 MG TABS tablet TAKE 1 TABLET BY MOUTH 2 (TWO) TIMES DAILY. Patient taking differently: Take 5 mg by mouth 2 (two) times daily.  12/20/19  Yes Meccariello, BBernita Raisin DO  FARXIGA 10 MG TABS tablet Take 10 mg by mouth daily. 10/02/19  Yes [provider]  ferrous sulfate 325 (65 FE) MG tablet Take 1 tablet (325 mg total) by mouth daily with breakfast. 09/01/19 01/08/20 Yes EAlma Friendly MD  fluticasone (FLONASE) 50 MCG/ACT nasal spray Place 2 sprays into both nostrils daily as needed for allergies or rhinitis. 12/19/18  Yes Rai, Ripudeep K, MD  hydrALAZINE (APRESOLINE) 25 MG tablet TAKE 1 TABLET BY MOUTH 3 (THREE) TIMES DAILY. FOR BLOOD PRESSURE Patient taking differently: Take 25 mg by mouth 3 (three) times daily.  10/02/19  Yes Meccariello, BBernita Raisin DO  HYDROmorphone (DILAUDID) 2 MG tablet TAKE ONE-HALF TABLETS (1 MG TOTAL) BY MOUTH DAILY AS NEEDED FOR SEVERE PAIN. Patient taking differently: Take 1 mg by mouth daily as needed for severe pain.  12/17/19  Yes Raulkar, KClide Deutscher MD  hydrOXYzine (ATARAX/VISTARIL) 10 MG tablet Take 1 tablet (10 mg total) by mouth 3 (three) times daily as needed. Patient taking differently: Take 10 mg by mouth 3 (three) times daily as needed for itching or anxiety.  11/15/19  Yes Meccariello, BBernita Raisin DO  insulin aspart (NOVOLOG FLEXPEN) 100 UNIT/ML FlexPen Inject 15 Units into the skin 3 (three) times daily with meals. Patient taking differently: Inject 30 Units into the skin in the morning, at noon, in the evening, and at bedtime.   06/26/19  Yes NMariel Aloe MD  isosorbide mononitrate (IMDUR) 60 MG 24 hr tablet TAKE 1 TABLET (60 MG TOTAL) BY MOUTH DAILY. 12/21/19  Yes Meccariello, BBernita Raisin DO  LANTUS SOLOSTAR 100 UNIT/ML Solostar Pen Inject 50 Units into the skin at bedtime. 11/15/19  Yes Meccariello, BBernita Raisin DO  melatonin 3 MG TABS tablet TAKE 2 TABLETS BY MOUTH AT BEDTIME. Patient taking differently: Take 6 mg by mouth at bedtime.  12/21/19  Yes Meccariello, BBernita Raisin DO  methocarbamol (ROBAXIN) 500 MG tablet Take 500 mg by mouth daily.    Yes [provider]  metoprolol succinate (TOPROL-XL) 25 MG 24 hr tablet TAKE ONE TABLET BY MOUTH ONCE DAILY Patient taking differently: Take 25 mg by mouth daily.  12/21/19  Yes Meccariello, BBernita Raisin DO  nitroGLYCERIN (NITROSTAT) 0.4 MG SL tablet Place 1 tablet (0.4 mg total) under the tongue every 5 (five) minutes as needed for chest pain. Patient taking differently: Place 0.4 mg under the tongue every 5 (five) minutes as needed for chest pain (max 3 doses).  12/13/18  Yes Lockamy, Timothy, DO  ondansetron (ZOFRAN-ODT) 4 MG disintegrating tablet Take 1 tablet (4 mg total) by mouth every 8 (eight) hours as needed for nausea or vomiting. 08/09/19  Yes Rai, Ripudeep K, MD  pantoprazole (PROTONIX) 40 MG tablet TAKE ONE TABLET BY MOUTH TWICE DAILY Patient taking differently: Take 40 mg by mouth 2 (two) times daily.  09/04/19  Yes [name], DO  spironolactone (ALDACTONE) 25 MG tablet Take 12.5 mg by mouth daily. 12/24/19  Yes [provider]  torsemide (DEMADEX) 20 MG tablet TAKE 2 TABLETS (40 MG TOTAL) BY MOUTH DAILY. 12/20/19  Yes Meccariello, Bernita Raisin, DO  blood glucose meter kit and supplies KIT Dispense based on patient and insurance preference. Use up to four times daily as directed. (FOR ICD-9 250.00, 250.01). 12/13/18   [name], DO  EASY COMFORT PEN NEEDLES 31G X 5 MM MISC USE 3 TIMES A DAY FOR INSULIN ADMINISTRATION 11/14/19   Meccariello, Bernita Raisin, DO     Scheduled Meds: . apixaban  5 mg Oral BID  . atorvastatin  10 mg Oral Daily  . dapagliflozin propanediol  10 mg Oral Daily  . DULoxetine  40 mg Oral Daily  . hydrALAZINE  25 mg Oral TID  . insulin aspart  0-15 Units Subcutaneous TID WC  . insulin glargine  20 Units Subcutaneous Daily  . isosorbide mononitrate  60 mg Oral Daily  . melatonin  6 mg Oral QHS  . metoprolol succinate  25 mg Oral Daily  . ondansetron (ZOFRAN) IV  4 mg Intravenous Once  . pantoprazole  40 mg Oral BID   Continuous Infusions: . cefTRIAXone (ROCEPHIN)  IV 2 g (01/11/20 2338)  . metronidazole 500 mg (01/12/20 1223)   PRN Meds:.HYDROmorphone (DILAUDID) injection, loperamide, ondansetron, scopolamine  Allergies as of 01/07/2020 - Review Complete 01/07/2020  Allergen Reaction Noted  . Contrast media [iodinated diagnostic agents] Anaphylaxis 11/28/2017  . Diazepam [name] 09/21/2011  . Isovue [iopamidol] Anaphylaxis 11/28/2017  . Lisinopril Anaphylaxis 09/21/2011  . Penicillins Palpitations 02/04/2011  . Acetaminophen Nausea Only and Other (See Comments) 08/28/2015  . Tolmetin Nausea Only and Other (See Comments) 09/21/2015  . Aspirin Other (See Comments) 09/26/2015  . Metoclopramide Other (See Comments) 08/08/2019  . Nsaids Other (See Comments) 09/21/2015  . Tramadol Nausea And Vomiting 12/22/2015    Family History  Problem Relation Age of Onset  . Diabetes Mother   . Diabetes Father   . Heart disease Father   . Diabetes Sister   . Congestive Heart Failure Sister 77  . Diabetes Brother     Social History   Socioeconomic History  . Marital status: Single    Spouse name: Not on file  . Number of children: Not on file  . Years of education: Not on file  . Highest education level: Not on file  Occupational History  . Not on file  Tobacco Use  . Smoking status: Never Smoker  . Smokeless tobacco: Never Used  Vaping Use  . Vaping Use: Never used  Substance and Sexual Activity   . Alcohol use: No  . Drug use: No  . Sexual activity: Not Currently    Birth control/protection: None  Other Topics Concern  . Not on file  Social History Narrative   ** Merged History Encounter **       Social Determinants of Health   Financial Resource Strain:   . Difficulty of Paying Living Expenses: Not on file  Food Insecurity:   .  Worried About Charity fundraiser in the Last Year: Not on file  . Ran Out of Food in the Last Year: Not on file  Transportation Needs:   . Lack of Transportation (Medical): Not on file  . Lack of Transportation (Non-Medical): Not on file  Physical Activity:   . Days of Exercise per Week: Not on file  . Minutes of Exercise per Session: Not on file  Stress:   . Feeling of Stress : Not on file  Social Connections:   . Frequency of Communication with Friends and Family: Not on file  . Frequency of Social Gatherings with Friends and Family: Not on file  . Attends Religious Services: Not on file  . Active Member of Clubs or Organizations: Not on file  . Attends Archivist Meetings: Not on file  . Marital Status: Not on file  Intimate Partner Violence:   . Fear of Current or Ex-Partner: Not on file  . Emotionally Abused: Not on file  . Physically Abused: Not on file  . Sexually Abused: Not on file    Review of Systems: All negative except as stated above in HPI.  Physical Exam: Vital signs: Vitals:   01/11/20 2146 01/12/20 0332  BP: (!) 108/97 (!) 110/92  Pulse: (!) 110 (!) 107  Resp: 19 18  Temp: 97.9 F (36.6 C) 98 F (36.7 C)  SpO2: 99% 99%   Last BM Date: 01/11/20 General:  Lethargic, obese, no acute distress  Head: normocephalic, atraumatic Eyes: anicteric sclera ENT: oropharynx clear Neck: supple, nontender Lungs:  Clear throughout to auscultation.   No wheezes, crackles, or rhonchi. No acute distress. Heart:  Regular rate and rhythm; no murmurs, clicks, rubs,  or gallops. Abdomen: diffuse tenderness with  guarding, soft, nondistended, +BS  Rectal:  Deferred Ext: no edema  GI:  Lab Results: Recent Labs    01/10/20 0250 01/11/20 1149 01/12/20 0133  WBC 11.0* 16.6* 12.4*  HGB 7.8* 7.2* 7.7*  HCT 26.4* 24.0* 25.2*  PLT 419* 404* 407*   BMET Recent Labs    01/10/20 0250 01/11/20 0038 01/12/20 0133  NA 136 136 133*  K 3.1* 3.4* 2.7*  CL 105 104 101  CO2 21* 20* 23  GLUCOSE 192* 239* 199*  BUN 23* 17 18  CREATININE 2.26* 1.72* 1.88*  CALCIUM 8.2* 8.7* 8.2*   LFT Recent Labs    01/12/20 0133  PROT 6.1*  ALBUMIN 2.2*  AST 14*  ALT 11  ALKPHOS 78  BILITOT 0.5   PT/INR No results for input(s): LABPROT, INR in the last 72 hours.   Studies/Results: CT ABDOMEN PELVIS WO CONTRAST  Result Date: 01/11/2020 CLINICAL DATA:  Abdominal pain EXAM: CT ABDOMEN AND PELVIS WITHOUT CONTRAST TECHNIQUE: Multidetector CT imaging of the abdomen and pelvis was performed following the standard protocol without IV contrast. COMPARISON:  11/26/2019 FINDINGS: Lower chest: No acute abnormality. Hepatobiliary: There is diffuse hepatic steatosis. No focal liver abnormality identified. Cholecystectomy. No biliary dilatation. Pancreas: Unremarkable. No pancreatic ductal dilatation or surrounding inflammatory changes. Spleen: Normal in size without focal abnormality. Adrenals/Urinary Tract: Normal appearance of the adrenal glands. No kidney mass, calcification, or hydronephrosis identified. Bladder unremarkable. Stomach/Bowel: Stomach is normal no abnormal small bowel wall thickening, inflammation or distension. The: Appears diffusely collapsed. Chronic intramural fatty deposition is identified and appears similar to recent CT exams. Although nonspecific this may be seen in the setting of chronic such as inflammatory bowel disease. Mild diffuse colonic wall thickening is likely secondary to incomplete  distension. No secondary signs scratch set no pericolonic inflammation or free fluid noted.  Vascular/Lymphatic: Mild aortic atherosclerosis. No aneurysm. No abdominopelvic adenopathy. Reproductive: Exophytic fibroid arising off the left fundus of the uterus is again noted extending into the left adnexal region. Pelvic organs are otherwise unremarkable. Other: No free fluid or fluid collections. Musculoskeletal: L5-S1 degenerative disc disease noted. No acute or suspicious osseous findings. IMPRESSION: 1. No acute findings identified within the abdomen or pelvis. 2. Chronic intramural fatty deposition within the colon which may be seen in the setting of chronic inflammation. 3. Hepatic steatosis. 4. Uterine fibroid. 5. Aortic atherosclerosis. Aortic Atherosclerosis (ICD10-I70.0). Electronically Signed   By: Kerby Moors M.D.   On: 01/11/2020 05:58   DG Chest Portable 1 View  Result Date: 01/11/2020 CLINICAL DATA:  Fever. EXAM: PORTABLE CHEST 1 VIEW COMPARISON:  January 08, 2020. FINDINGS: Enlarged cardiac silhouette. Both lungs are clear. No visible pleural effusions or pneumothorax. No acute osseous abnormality. IMPRESSION: 1. No acute cardiopulmonary disease. 2. Cardiomegaly. Electronically Signed   By: Margaretha Sheffield MD   On: 01/11/2020 11:03    Impression/Plan: Diabetic gastroparesis that will not improve as long as she remains on narcotic pain meds. She refuses to accept that Dilaudid will worsen her gastroparesis symptoms stating that "it helps mine." I do not think a repeat EGD is needed. Doubt any intra-abdominal process causing leucocytosis. Would not retry Reglan due to history of tremors and Reglan also unlikely to help as long as she is on Dilaudid. Other prokinetics unlikely to help as long as she is on the Dilaudid. If she continues to have N/V then change to NPO. No additional GI recs. Will sign off. Call if questions.    LOS: 3 days   Lear Ng  01/12/2020, 1:06 PM  Questions please call [phone]

## 2020-01-12 NOTE — Progress Notes (Signed)
Pt refused potassium pills again stating that she didn't want to take them now because her throat hurts. I again Explained the importance of taking the potassium. She says she will take it later. Reported this to Marge, the oncoming RN and text paged the MD.

## 2020-01-12 NOTE — Progress Notes (Addendum)
Attempted to place "hat" in [facility] in order to collect urine specimen, but it would not fit and patient stated, "that's not going to work."  I explained to her that we had doctor's orders to collect a urine specimen. I offered to take her into the bathroom because the hat would fit in the toilet,but she said she could not walkto the bathroom. Pt sat on the [facility] and voided but the urine was mixed with stool so unable to obtain specimen suitable for UA or urine culture. Will attempt again later.I explained to patient that we might have to do an in and out catheterization if she could not produce a voided specimen.She stated that she wiould not have a catheter. I told her that it would not be a catheter that remained in place ;we would only use it to drain her bladder, but she again stated her refusal.

## 2020-01-13 DIAGNOSIS — R109 Unspecified abdominal pain: Secondary | ICD-10-CM | POA: Diagnosis not present

## 2020-01-13 LAB — BASIC METABOLIC PANEL
Anion gap: 11 (ref 5–15)
BUN: 20 mg/dL (ref 6–20)
CO2: 19 mmol/L — ABNORMAL LOW (ref 22–32)
Calcium: 8.3 mg/dL — ABNORMAL LOW (ref 8.9–10.3)
Chloride: 100 mmol/L (ref 98–111)
Creatinine, Ser: 1.98 mg/dL — ABNORMAL HIGH (ref 0.44–1.00)
GFR, Estimated: 29 mL/min — ABNORMAL LOW (ref >60)
Glucose, Bld: 289 mg/dL — ABNORMAL HIGH (ref 70–99)
Potassium: 3.3 mmol/L — ABNORMAL LOW (ref 3.5–5.1)
Sodium: 130 mmol/L — ABNORMAL LOW (ref 135–145)

## 2020-01-13 LAB — CBC
HCT: 27.7 % — ABNORMAL LOW (ref 36.0–46.0)
Hemoglobin: 8.5 g/dL — ABNORMAL LOW (ref 12.0–15.0)
MCH: 28.1 pg (ref 26.0–34.0)
MCHC: 30.7 g/dL (ref 30.0–36.0)
MCV: 91.7 fL (ref 80.0–100.0)
Platelets: 391 10*3/uL (ref 150–400)
RBC: 3.02 MIL/uL — ABNORMAL LOW (ref 3.87–5.11)
RDW: 15.1 % (ref 11.5–15.5)
WBC: 8.1 10*3/uL (ref 4.0–10.5)
nRBC: 0 % (ref 0.0–0.2)

## 2020-01-13 LAB — GLUCOSE, CAPILLARY
Glucose-Capillary: 185 mg/dL — ABNORMAL HIGH (ref 70–99)
Glucose-Capillary: 253 mg/dL — ABNORMAL HIGH (ref 70–99)
Glucose-Capillary: 204 mg/dL — ABNORMAL HIGH (ref 70–99)
Glucose-Capillary: 271 mg/dL — ABNORMAL HIGH (ref 70–99)

## 2020-01-13 MED ORDER — ONDANSETRON HCL 4 MG/2ML IJ SOLN
4.0000 mg | INTRAMUSCULAR | Status: DC | PRN
  Administered 2020-01-13 – 2020-01-14 (×4): 4 mg via INTRAVENOUS
  Filled 2020-01-13 (×4): qty 2

## 2020-01-13 MED ORDER — ONDANSETRON HCL 4 MG/2ML IJ SOLN
4.0000 mg | Freq: Once | INTRAMUSCULAR | Status: AC
  Administered 2020-01-13: 4 mg via INTRAVENOUS
  Filled 2020-01-13: qty 2

## 2020-01-13 MED ORDER — INSULIN GLARGINE 100 UNIT/ML ~~LOC~~ SOLN
25.0000 [IU] | Freq: Every day | SUBCUTANEOUS | Status: DC
  Administered 2020-01-14: 25 [IU] via SUBCUTANEOUS
  Filled 2020-01-13: qty 0.25

## 2020-01-13 NOTE — Progress Notes (Signed)
Went to evaluate patient due to age about nausea.  Patient states that the previous dose of Zofran did seem to help her nausea.  She requested another dose.  Patient denies other complaints.  I did speak to pharmacy prior to going to see the patient because of her QTC of 491 from 01/09/20.  Pharmacy agreed that Zofran at 4 mg IV should be fine for her and stated that most of the problems with prolonged QTC and Zofran occur at chemotherapy doses significantly higher than this.    General: Resting in bed, no apparent distress Heart: Regular rate and rhythm with no murmurs appreciated Lungs: CTA bilaterally, no wheezing  Plan: Will provide patient 4 mg IV Zofran

## 2020-01-13 NOTE — Progress Notes (Signed)
Family Medicine Teaching Service Daily Progress Note Intern Pager: [phone]  Patient name: Deborah [name] Medical record number: [id number] Date of birth: Nov 12, 1965 Age: 54 y.o. Gender: female  Primary Care Provider: Cleophas Dunker, DO Consultants: GI Code Status: Full  Pt Overview and Major Events to Date:  Admitted 11/2  Assessment and Plan: Deborah [name] a 54 y.o.femalepresenting with 2 days of abdominal pain. Her previous medical history is significant for A. fib, diabetes type 2, gastroparesis, carcinoid tumor of the duodenum with recent surgery.  Acute on chronic Abdominal Pain Likely due to Gastroparesis. On oral Dilaudid at home. Currently on IV Dilaudid 1mg  q8hr. Continue to wean IV Dilaudid as able and transition to oral.  - Continue Dilaudid q8hr, patient aware of plan to wean down to q12hr tomorrow - avoiding metoclopramide - Switch Zofran to IV 4 mg q4hr PRN   Sepsis with concern for intra-abdominal infection Afebrile > 48 hours.  Blood culture negative x 48 hours. CXR within normal limits.  Patient on ceftriaxone and Flagyl.  Discussed case with general surgery and they requested GI consult first.  GI consulted.  Have no further recommendations.  Low concern for sepsis currently. -d/c ceftriaxone and Flagyl at this time  Chest Pain Ekg w/ no st changes. - continue to monitor.   HFmrEF Previous Echo:45-50% LVEF. Global hypokinesis. Not taking home torsemide for the past week..  -Stopped Lasix due to worsening Creatinine - strict I/Os - bmp's -restarted home meds:farxiga,Imdur, toprol   A fib Pt taking eliquis 5mg  bid at home. On toprol xl 25mg .  - continue home meds  Diabetes type 2 At home she takes Lantus 50 U qhs and novolog 30mg  TID w/ meals - lantus 20 U  - mSSI  Fibromyalgia on duloxetine at home.  - continue home med  Anxiety Home medications include buspar and xanax prn. Pt states she rarely  takes her xanax.  - continue buspar - holding xanax  FEN/GI: Full PPx: On Eliquis 5 mg BID   Status is: Inpatient  Remains inpatient appropriate because:Ongoing active pain requiring inpatient pain management   Dispo: The patient is from: Home              Anticipated d/c is to: Home              Anticipated d/c date is: 2 days              Patient currently is not medically stable to d/c.    Subjective:  Patient still having abdominal pain and nausea.  Indicates the Zofran is helping with nausea.   Indicates she is eating a decent and wants to continue doing so to be discharged.  Objective: Temp:  [97.4 F (36.3 C)-97.8 F (36.6 C)] 97.4 F (36.3 C) (11/06 2132) Pulse Rate:  [91-95] 91 (11/06 2132) Resp:  [17] 17 (11/06 1519) BP: (109-117)/(60-71) 117/71 (11/06 2132) SpO2:  [100 %] 100 % (11/06 2132)  Physical Exam: Physical Exam Constitutional:      General: She is not in acute distress.    Appearance: She is not ill-appearing.  HENT:     Head: Normocephalic and atraumatic.  Cardiovascular:     Rate and Rhythm: Normal rate and regular rhythm.  Pulmonary:     Effort: Pulmonary effort is normal. No respiratory distress.     Breath sounds: Normal breath sounds.  Skin:    General: Skin is warm.     Capillary Refill: Capillary refill takes less than 2  seconds.  Neurological:     Mental Status: She is alert.     Laboratory: Recent Labs  Lab 01/10/20 0250 01/11/20 1149 01/12/20 0133  WBC 11.0* 16.6* 12.4*  HGB 7.8* 7.2* 7.7*  HCT 26.4* 24.0* 25.2*  PLT 419* 404* 407*   Recent Labs  Lab 01/07/20 1807 01/07/20 1807 01/08/20 1220 01/09/20 0238 01/11/20 0038 01/12/20 0133 01/12/20 1809  NA 139   < > 138   < > 136 133* 130*  K 3.4*   < > 3.5   < > 3.4* 2.7* 4.2  CL 105   < > 103   < > 104 101 101  CO2 21*   < > 23   < > 20* 23 18*  BUN 12   < > 15   < > 17 18 21*  CREATININE 1.22*   < > 1.46*   < > 1.72* 1.88* 1.90*  CALCIUM 8.3*   < > 8.2*   < >  8.7* 8.2* 8.3*  PROT 7.3  --  7.2  --   --  6.1*  --   BILITOT 0.7  --  0.5  --   --  0.5  --   ALKPHOS 99  --  96  --   --  78  --   ALT 14  --  14  --   --  11  --   AST 18  --  17  --   --  14*  --   GLUCOSE 219*   < > 322*   < > 239* 199* 297*   < > = values in this interval not displayed.    Imaging/Diagnostic Tests: No new imaging  Delora Fuel, MD 01/13/2020, 8:32 AM PGY-1, [place] Intern pager: [phone], text pages welcome

## 2020-01-14 ENCOUNTER — Encounter: Payer: Self-pay | Admitting: Family Medicine

## 2020-01-14 LAB — BASIC METABOLIC PANEL
Anion gap: 11 (ref 5–15)
BUN: 18 mg/dL (ref 6–20)
CO2: 22 mmol/L (ref 22–32)
Calcium: 8.4 mg/dL — ABNORMAL LOW (ref 8.9–10.3)
Chloride: 103 mmol/L (ref 98–111)
Creatinine, Ser: 1.95 mg/dL — ABNORMAL HIGH (ref 0.44–1.00)
GFR, Estimated: 30 mL/min — ABNORMAL LOW (ref >60)
Glucose, Bld: 228 mg/dL — ABNORMAL HIGH (ref 70–99)
Potassium: 3.2 mmol/L — ABNORMAL LOW (ref 3.5–5.1)
Sodium: 136 mmol/L (ref 135–145)

## 2020-01-14 LAB — GLUCOSE, CAPILLARY
Glucose-Capillary: 222 mg/dL — ABNORMAL HIGH (ref 70–99)
Glucose-Capillary: 261 mg/dL — ABNORMAL HIGH (ref 70–99)

## 2020-01-14 MED ORDER — HYDROMORPHONE HCL 1 MG/ML IJ SOLN: 1.0000 mg | Freq: Two times a day (BID) | INTRAMUSCULAR | Status: DC | PRN

## 2020-01-14 MED ORDER — POTASSIUM CHLORIDE CRYS ER 20 MEQ PO TBCR
40.0000 meq | EXTENDED_RELEASE_TABLET | Freq: Once | ORAL | Status: AC
  Administered 2020-01-14: 40 meq via ORAL
  Filled 2020-01-14: qty 2

## 2020-01-14 NOTE — Progress Notes (Signed)
Family Medicine Teaching Service Attending Brief Progress Note  S: Patient seen and examined. Patient reports she is feeling better today. Desires to go home. Still some nausea but is keeping some liquids down.  O: BP 130/67 (BP Location: Left Arm)    Pulse 87    Temp (!) 97.5 F (36.4 C) (Oral)    Resp 20    Ht 5\' 6"  (1.676 m)    Wt (!) 151.9 kg    LMP 10/10/2012    SpO2 100%    BMI 54.05 kg/m   Gen: no acute distress, pleasant, cooperative Resp: normal work of breathing Abdomen: soft, minimally tender to palpation  A/P:  55 yo F with abdominal pain, n/v due to gastroparesis, now improved and wanting to go home. K is low so will replete, then can be discharged home to follow up as outpatient later this week, will need BMET checked at that time.  Will cosign resident note when it is available.  Chrisandra Netters, MD [place]

## 2020-01-14 NOTE — Hospital Course (Addendum)
Deborah [name] is a 54 y.o. female presenting with 2 days of abdominal pain.  Her previous medical history is significant for A. fib, diabetes type 2, gastroparesis, carcinoid tumor of the duodenum with recent surgery.  Abdominal pain Patient presented with abdominal pain along with nausea and vomiting with poor PO intake.  Was started on IV Dilaudid for pain control.  Was prescribed oral dilaudid at home for pain.  Abdominal CT was obtained which showed no acute findings.  Lipase was normal.  Patient had one episode of fever at 100.8.  Received work-up involving blood cultures.  Patient refused urinalysis and urine cultures.  Antibiotics were started and stopped when blood cultures returned negative.  GI consulted in regards to patient's pain.  Believed pain likely due to Gastroparesis and avoid Dilaudid usage.  Had no further recs.  Patient had improvement in pain and able to tolerate PO and having good intake by time of discharge.  Chest pain Patient presented with chest pain in addition to abdominal pain.  Troponins were trended and remained stable at 33.  Initial chest X-ray showed pulmonary infiltrates consistent with CHF. Multiple EKG's obtained throughout stay which showed no ST changes, only prolonged QT.  Though likely referred pain from abdomen or fibromyalgia.  Congestive heart failure Initial chest X-ray showed pulmonary infiltrates consistent with CHF.  BNP was elevated at 250.  Large amount of fluid came off with aggressive diuretic use.  Acute on Chronic Kidney Failure Patient creatinine initially 1.22 on admission.  Increased over the next 2 days due to 2.26.  Was on Torsemide 80 mg BID which was stopped.  Creatinine then improved to 1.8.

## 2020-01-14 NOTE — Discharge Instructions (Signed)
Dear Deborah [name],   Thank you for letting us participate in your care! In this section, you will find a brief hospital admission summary of why you were admitted to the hospital, what happened during your admission, your diagnosis/diagnoses, and recommended follow up.   You were admitted because you were experiencing abdominal pain.   You were diagnosed with gastroparesis.  Your work-up was negative.  You were treated with pain medication and antinausea medication.   You were also seen by gastroenterology. They recommended against using dilaudid for gastroparesis pain.    Your pain  improved and you were discharged from the hospital for meeting this goal.   POST-HOSPITAL & CARE INSTRUCTIONS 1. Please follow up with your PCP as listed below. We will recheck labs at that time.  2. Please let PCP/Specialists know of any changes that were made.  3. Please see medications section of this packet for any medication changes.   DOCTOR'S APPOINTMENT & FOLLOW UP CARE INSTRUCTIONS  Future Appointments  Date Time Provider [place]  01/21/2020  8:50 AM Cleophas Dunker, DO [facility]  01/29/2020  2:00 PM Spero Geralds, MD [facility] None  02/04/2020  1:45 PM [facility] None  02/04/2020  2:20 PM Brunetta Genera, MD [facility] None   Thank you for choosing [facility]! Take care and be well!  [facility]  [place],  [address] [phone]

## 2020-01-14 NOTE — Progress Notes (Signed)
Family Medicine Teaching Service Daily Progress Note Intern Pager: [phone]  Patient name: Deborah [name] Medical record number: [id number] Date of birth: 1965-10-13 Age: 54 y.o. Gender: female  Primary Care Provider: Cleophas Dunker, DO Consultants: GI Code Status: Full  Pt Overview and Major Events to Date:  Admitted 11/2  Assessment and Plan: Deborah [name] a 54 y.o.femalepresenting with 2 days of abdominal pain. Her previous medical history is significant for A. fib, diabetes type 2, gastroparesis, carcinoid tumor of the duodenum with recent surgery.  Acute on chronic Abdominal Pain Likely due to Gastroparesis. On oral Dilaudid at home. Currently on IV Dilaudid 1mg  q8hr. Continue to wean IV Dilaudid as able and transition to oral. - Wean down to Dilaudid q12hr tomorrow - avoiding metoclopramide - Continue Zofran to IV 4 mg q4hr PRN   Sepsiswith concern for intra-abdominal infection Afebrile > 48 hours.  Blood culture negative x 48 hours.CXR within normal limits.Patient on ceftriaxone and Flagyl. Discussed case with general surgery and they requested GI consult first. GI consulted. Have no further recommendations.  Low concern for sepsis currently. - No anitbiotics at this time  Chest Pain Ekg w/ no st changes. - continue to monitor.   HFmrEF Previous Echo:45-50% LVEF. Global hypokinesis. Not taking home torsemide for the past week..  -Stopped Lasix due to worsening Creatinine - strict I/Os - bmp's -restarted home meds:farxiga,Imdur, toprol   A fib Pt taking eliquis 5mg  bid at home. On toprol xl 25mg .  - continue home meds  Diabetes type 2 At home she takes Lantus 50 U qhs and novolog 30mg  TID w/ meals - lantus 20 U  - mSSI  Fibromyalgia on duloxetine at home.  - continue home med  Anxiety Home medications include buspar and xanax prn. Pt states she rarely takes her xanax.  - continue buspar - holding  xanax   FEN/GI: Full PPx: On Eliquis 5 mg BID    Subjective:  Patient indicates nausea is still present.  Does not feel Oral or ODT Zofran helps.    Objective: Temp:  [97.5 F (36.4 C)-98.2 F (36.8 C)] 97.5 F (36.4 C) (11/08 1320) Pulse Rate:  [87-93] 87 (11/08 1550) Resp:  [18-20] 20 (11/08 1550) BP: (109-147)/(52-81) 130/67 (11/08 1550) SpO2:  [99 %-100 %] 100 % (11/08 1550)  Physical Exam: Physical Exam Constitutional:      General: She is not in acute distress.    Appearance: She is not ill-appearing.  HENT:     Head: Normocephalic and atraumatic.  Cardiovascular:     Rate and Rhythm: Normal rate and regular rhythm.  Pulmonary:     Effort: Pulmonary effort is normal.     Breath sounds: Normal breath sounds.  Abdominal:     Palpations: Abdomen is soft.     Tenderness: There is abdominal tenderness.  Skin:    General: Skin is warm.  Neurological:     General: No focal deficit present.     Mental Status: She is alert.     Laboratory: Recent Labs  Lab 01/11/20 1149 01/12/20 0133 01/13/20 1824  WBC 16.6* 12.4* 8.1  HGB 7.2* 7.7* 8.5*  HCT 24.0* 25.2* 27.7*  PLT 404* 407* 391   Recent Labs  Lab 01/08/20 1220 01/09/20 0238 01/12/20 0133 01/12/20 0133 01/12/20 1809 01/13/20 1824 01/14/20 1229  NA 138   < > 133*   < > 130* 130* 136  K 3.5   < > 2.7*   < > 4.2 3.3* 3.2*  CL  103   < > 101   < > 101 100 103  CO2 23   < > 23   < > 18* 19* 22  BUN 15   < > 18   < > 21* 20 18  CREATININE 1.46*   < > 1.88*   < > 1.90* 1.98* 1.95*  CALCIUM 8.2*   < > 8.2*   < > 8.3* 8.3* 8.4*  PROT 7.2  --  6.1*  --   --   --   --   BILITOT 0.5  --  0.5  --   --   --   --   ALKPHOS 96  --  78  --   --   --   --   ALT 14  --  11  --   --   --   --   AST 17  --  14*  --   --   --   --   GLUCOSE 322*   < > 199*   < > 297* 289* 228*   < > = values in this interval not displayed.    Imaging/Diagnostic Tests: No new imaging at this time  Delora Fuel, MD 01/14/2020,  6:11 PM PGY-1, [place] Intern pager: [phone], text pages welcome

## 2020-01-14 NOTE — Progress Notes (Signed)
   Patient suffers from fibromyalgia, weakness and pain from recent sx to remove tumor from duedenum which impairs their ability to perform daily activities like ambulating safely, decreased ability to care for self in standing and decreased activity tolerance in the home.  A walker alone will not resolve the issues with performing activities of daily living. A wheelchair will allow patient to safely perform daily activities.  The patient can self propel in the home or has a caregiver who can provide assistance.     Deborah [name], OTR/L Acute Rehabilitation Services Pager: [phone] Office: [phone]

## 2020-01-14 NOTE — Progress Notes (Signed)
Pt with orders for rollator and wheelchair with cushion. CM has sent information to [employer] and they will deliver the DME to the room.  TOC following.

## 2020-01-14 NOTE — Discharge Planning (Signed)
Deborah [name] to be D/C'd  per MD order. Discussed with the patient and all questions fully answered.  VSS, Skin clean, dry and intact without evidence of skin break down, no evidence of skin tears noted.  IV catheter discontinued intact. Site without signs and symptoms of complications. Dressing and pressure applied.  An After Visit Summary was printed and given to the patient. Patient received prescription.  D/c education completed with patient/family including follow up instructions, medication list, d/c activities limitations if indicated, with other d/c instructions as indicated by MD - patient able to verbalize understanding, all questions fully answered.   Patient instructed to return to ED, call 911, or call MD for any changes in condition.   Patient to be escorted via [place], and D/C home via private auto.

## 2020-01-14 NOTE — Progress Notes (Signed)
Occupational Therapy Treatment Patient Details Name: Deborah [name] MRN: [id number] DOB: 1965-07-10 Today's Date: 01/14/2020    History of present illness The pt is a 54 yo female presenting with worsening abdominal pain with nausea and vomiting. During admission, pt undergoing further workup of abdominal pain, complicated by signficant pain levels. PMH includes: chronic back pain, CHF, DM II, HLD, HTN, obesity, afib, and stroke.    OT comments  Pt set-upA to minA for ADL; transfers with modified independence and stays upright in recliner most of the day and transfers to [facility] with no assist. Pt able to perform perineal care after set-upA. Pt reports that she has  PCS at home to assist as needed 2.5 hours daily for when she returns home. Pt education performed for energy conservation. Pt reports that her w/[name] is older than 5 years and it is her primary means of transport so she requires a new bariatric one. Pt stating "I also want a rollator- I need one of those." Pt also stating in the same sentence "I don't walk." Pt at her functional baseline for ADL and ADL functional mobility and does not require continued OT skilled services. OT signing off. OT D/[name] at this time.    Follow Up Recommendations  No OT follow up    Equipment Recommendations  Wheelchair (measurements OT);Wheelchair cushion (measurements OT);Other (comment) (rollator; w/[name] bariatric)    Recommendations for Other Services      Precautions / Restrictions Precautions Precautions: None Restrictions Weight Bearing Restrictions: No       Mobility Bed Mobility               General bed mobility comments: Pt in recliner upon arrival and post session  Transfers                 General transfer comment: no physical assist for transfers;pt performing without staff assisting    Balance Overall balance assessment: Mild deficits observed, not formally tested                             High Level  Balance Comments: no physical assist           ADL either performed or assessed with clinical judgement   ADL Overall ADL's : Needs assistance/impaired;At baseline     Grooming: Modified independent;Sitting               Lower Body Dressing: Minimal assistance;Sitting/lateral leans;Sit to/from stand Lower Body Dressing Details (indicate cue type and reason): requires assist to start LB dressing; pt reports  "I don't wear underwear because I have too many accidents." Pt reports wearing dresses and slip on shoes; no socks. Toilet Transfer: Psychologist, counselling Details (indicate cue type and reason): no physical assistance as pt able take a few steps to bariatric BSC Toileting- Clothing Manipulation and Hygiene: Set up;Sitting/lateral lean;Sit to/from stand Toileting - Clothing Manipulation Details (indicate cue type and reason): Pt able to perform perineal care after set-upA     Functional mobility during ADLs: Modified independent General ADL Comments: Pt at her functional baseline for ADL tasks and ADL functional mobility     Vision   Vision Assessment?: No apparent visual deficits   Perception     Praxis      Cognition Arousal/Alertness: Awake/alert Behavior During Therapy: WFL for tasks assessed/performed Overall Cognitive Status: Within Functional Limits for tasks assessed  Exercises     Shoulder Instructions       General Comments VSS on RA    Pertinent Vitals/ Pain       Pain Assessment: 0-10 Pain Score: 2  Pain Location: abdomen Pain Descriptors / Indicators: Discomfort Pain Intervention(s): Monitored during session  Home Living                                          Prior Functioning/Environment              Frequency  Min 2X/week        Progress Toward Goals  OT Goals(current goals can now be found in the care plan section)   Progress towards OT goals: Progressing toward goals  Acute Rehab OT Goals Patient Stated Goal: reduce pain OT Goal Formulation: With patient Time For Goal Achievement: 01/25/20 Potential to Achieve Goals: Good ADL Goals Pt Will Perform Lower Body Bathing: with min assist;sit to/from stand;sitting/lateral leans Pt Will Perform Lower Body Dressing: with min assist;sit to/from stand;sitting/lateral leans Pt Will Transfer to Toilet: with modified independence;stand pivot transfer Pt Will Perform Toileting - Clothing Manipulation and hygiene: with modified independence;sitting/lateral leans;sit to/from stand  Plan All goals met and education completed, patient discharged from OT services    Co-evaluation                 AM-PAC OT "6 Clicks" Daily Activity     Outcome Measure   Help from another person eating meals?: None Help from another person taking care of personal grooming?: None Help from another person toileting, which includes using toliet, bedpan, or urinal?: None Help from another person bathing (including washing, rinsing, drying)?: A Little Help from another person to put on and taking off regular upper body clothing?: A Little Help from another person to put on and taking off regular lower body clothing?: A Lot 6 Click Score: 20    End of Session    OT Visit Diagnosis: Unsteadiness on feet (R26.81);Pain Pain - part of body:  (abdomen)   Activity Tolerance Patient tolerated treatment well   Patient Left in chair;with call bell/phone within reach   Nurse Communication Mobility status        Time: 1250-1307 OT Time Calculation (min): 17 min  Charges: OT General Charges $OT Visit: 1 Visit OT Treatments $Self Care/Home Management : 8-22 mins  Deborah [name], OTR/L Acute Rehabilitation Services Pager: [phone] Office: [phone]    Deborah [name] 01/14/2020, 2:05 PM

## 2020-01-15 NOTE — Telephone Encounter (Signed)
Deborah [name] patient has been in hospital and was recently discharged. I called today spoke to patient reschedule for telephone RN CM follow up on 01/17/2020.  Thank you   [name]

## 2020-01-15 NOTE — Chronic Care Management (AMB) (Signed)
°  Care Management   Note  01/15/2020 Name: MAYLI COVINGTON MRN: [id number] DOB: [birth year]  [name] is a 54 y.o. year old female who is a primary care patient of [name], Bernita Raisin, DO and is actively engaged with the care management team. I reached out to [name] by phone today to assist with re-scheduling a follow up visit with the RN Case Manager.  Follow up plan: Telephone appointment with care management team member scheduled for:01/17/2020  [facility]  Direct Dial: [phone]

## 2020-01-16 LAB — CULTURE, BLOOD (ROUTINE X 2)
Culture: NO GROWTH
Culture: NO GROWTH
Special Requests: ADEQUATE
Special Requests: ADEQUATE

## 2020-01-16 NOTE — Discharge Summary (Addendum)
[facility] Discharge Summary  Patient name: Deborah [name] Medical record number: [id number] Date of birth: 1965/04/29 Age: 54 y.o. Gender: female Date of Admission: 01/07/2020  Date of Discharge: 01/14/20 Admitting Physician: Lenoria Chime, MD  Primary Care Provider: Cleophas Dunker, DO Consultants: GI  Indication for Hospitalization: Abdominal Pain, Nausea/Vomiting  Discharge Diagnoses/Problem List: A. fib, diabetes type 2, gastroparesis, carcinoid tumor of the duodenum with recent surgery.   Disposition: Able to be discharged safely home  Discharge Condition: Stable  Discharge Exam:   Physical Exam Constitutional:      General: She is not in acute distress.    Appearance: She is not ill-appearing.  HENT:     Head: Normocephalic and atraumatic.  Cardiovascular:     Rate and Rhythm: Normal rate and regular rhythm.  Pulmonary:     Effort: Pulmonary effort is normal.     Breath sounds: Normal breath sounds.  Abdominal:     Palpations: Abdomen is soft.     Tenderness: There is abdominal tenderness.  Skin:    General: Skin is warm.  Neurological:     General: No focal deficit present.     Mental Status: She is alert.      Brief Hospital Course:  Deborah [name] is a 54 y.o. female presenting with 2 days of abdominal pain.  Her previous medical history is significant for A. fib, diabetes type 2, gastroparesis, carcinoid tumor of the duodenum with recent surgery.  Abdominal pain Patient presented with abdominal pain along with nausea and vomiting with poor PO intake.  Was started on IV Dilaudid for pain control.  Prior to admission prescribed oral dilaudid at home for pain by PM&R.  Abdominal CT was obtained which showed no acute findings.  Lipase was normal.  Patient had one episode of fever at 100.8.  Received work-up involving blood cultures.  Patient refused urinalysis and urine cultures.  Antibiotics were started and stopped  when blood cultures returned negative. Sepsis ruled out.  GI consulted in regards to patient's pain.  Believed pain likely due to Gastroparesis and avoid Dilaudid usage.  Had no further recs.  Patient had improvement in pain and able to tolerate PO and having good intake by time of discharge.  Chest pain Patient presented with chest pain in addition to abdominal pain.  Troponins were trended and remained flat at 33.  Initial chest X-ray showed pulmonary infiltrates consistent with CHF. Multiple EKG's obtained throughout stay which showed no ST changes, only prolonged QT.  Though likely referred pain from abdomen or fibromyalgia.  Congestive heart failure Initial chest X-ray showed pulmonary infiltrates consistent with CHF.  BNP was elevated at 250.  Large amount of fluid came off with aggressive diuretic use.  Acute on Chronic Kidney Failure Patient creatinine initially 1.22 on admission which was attributed to diuresis. Creatinine improved prior to discharge, but should be rechecked at outpatient follow up.   Issues for Follow Up:  1. Gastroparesis-  Strongly recommend alternative to pain control besides Dilaudid, as likely only making pain worse. 2. Creatinine-  Obtain BMP to see if has improved.  Significant Procedures: None  Significant Labs and Imaging:  Recent Labs  Lab 01/11/20 1149 01/12/20 0133 01/13/20 1824  WBC 16.6* 12.4* 8.1  HGB 7.2* 7.7* 8.5*  HCT 24.0* 25.2* 27.7*  PLT 404* 407* 391   Recent Labs  Lab 01/11/20 0038 01/11/20 0038 01/12/20 0133 01/12/20 0133 01/12/20 1809 01/12/20 1809 01/13/20 1824 01/14/20 1229  NA  136  --  133*  --  130*  --  130* 136  K 3.4*   < > 2.7*   < > 4.2   < > 3.3* 3.2*  CL 104  --  101  --  101  --  100 103  CO2 20*  --  23  --  18*  --  19* 22  GLUCOSE 239*  --  199*  --  297*  --  289* 228*  BUN 17  --  18  --  21*  --  20 18  CREATININE 1.72*  --  1.88*  --  1.90*  --  1.98* 1.95*  CALCIUM 8.7*  --  8.2*  --  8.3*  --  8.3*  8.4*  ALKPHOS  --   --  78  --   --   --   --   --   AST  --   --  14*  --   --   --   --   --   ALT  --   --  11  --   --   --   --   --   ALBUMIN  --   --  2.2*  --   --   --   --   --    < > = values in this interval not displayed.   Lipase-21 Lactic Acid- 2.6 >>1.8  Results/Tests Pending at Time of Discharge: None  Discharge Medications:  Allergies as of 01/14/2020      Reactions   Diazepam [name]   Isovue [iopamidol] Anaphylaxis   11/28/17 Patient had seizure like activity and then 1 min code after 100 cc of isovue 300. Possible contrast allergy vs vasovagal episode   Lisinopril Anaphylaxis   Tongue and mouth swelling   Metoclopramide Other (See Comments)   Tardive dyskinesia   Penicillins Palpitations   Has patient had a PCN reaction causing immediate rash, facial/tongue/throat swelling, SOB or lightheadedness with hypotension: Yes, heart races Has patient had a PCN reaction causing severe rash involving mucus membranes or skin necrosis: No Has patient had a PCN reaction that required hospitalization: Yes  Has patient had a PCN reaction occurring within the last 10 years: No   Tolmetin Nausea Only, Other (See Comments)   ULCER   Aspirin Other (See Comments)   Irritates stomach ulcer    Nsaids Other (See Comments)   ULCER   Acetaminophen Nausea Only, Other (See Comments)   Irritates stomach ulcer; Abdominal pain   Tramadol Nausea And Vomiting      Medication List    TAKE these medications   albuterol (2.5 MG/3ML) 0.083% nebulizer solution Commonly known as: PROVENTIL Take 3 mLs (2.5 mg total) by nebulization every 6 (six) hours as needed for wheezing or shortness of breath.   allopurinol 100 MG tablet Commonly known as: ZYLOPRIM Take 100 mg by mouth daily.   ALPRAZolam 1 MG tablet Commonly known as: XANAX Take 1 tablet (1 mg total) by mouth 2 (two) times daily as needed for anxiety.   atorvastatin 10 MG tablet Commonly known as: LIPITOR TAKE ONE  TABLET BY MOUTH ONCE DAILY (AM) What changed: See the new instructions.   blood glucose meter kit and supplies Kit Dispense based on patient and insurance preference. Use up to four times daily as directed. (FOR ICD-9 250.00, 250.01).   cetirizine 10 MG tablet Commonly known as: ZYRTEC TAKE ONE TABLET BY MOUTH ONCE DAILY (AM) What changed:  See the new instructions.   colchicine 0.6 MG tablet Take 2 tablets at 1 time (1.2 mg total) and 1 hour later take 1 tablet What changed:   how much to take  how to take this  when to take this  additional instructions   DULoxetine HCl 40 MG Cpep Take 40 mg by mouth in the morning and at bedtime.   Easy Comfort Pen Needles 31G X 5 MM Misc Generic drug: Insulin Pen Needle USE 3 TIMES A DAY FOR INSULIN ADMINISTRATION   Eliquis 5 MG Tabs tablet Generic drug: apixaban TAKE 1 TABLET BY MOUTH 2 (TWO) TIMES DAILY. What changed: See the new instructions.   Farxiga 10 MG Tabs tablet Generic drug: dapagliflozin propanediol Take 10 mg by mouth daily.   ferrous sulfate 325 (65 FE) MG tablet Take 1 tablet (325 mg total) by mouth daily with breakfast.   fluticasone 50 MCG/ACT nasal spray Commonly known as: FLONASE Place 2 sprays into both nostrils daily as needed for allergies or rhinitis.   hydrALAZINE 25 MG tablet Commonly known as: APRESOLINE TAKE 1 TABLET BY MOUTH 3 (THREE) TIMES DAILY. FOR BLOOD PRESSURE What changed: See the new instructions.   HYDROmorphone 2 MG tablet Commonly known as: DILAUDID TAKE ONE-HALF TABLETS (1 MG TOTAL) BY MOUTH DAILY AS NEEDED FOR SEVERE PAIN. What changed: See the new instructions.   hydrOXYzine 10 MG tablet Commonly known as: ATARAX/VISTARIL Take 1 tablet (10 mg total) by mouth 3 (three) times daily as needed. What changed: reasons to take this   isosorbide mononitrate 60 MG 24 hr tablet Commonly known as: IMDUR TAKE 1 TABLET (60 MG TOTAL) BY MOUTH DAILY.   Lantus SoloStar 100 UNIT/ML Solostar  Pen Generic drug: insulin glargine Inject 50 Units into the skin at bedtime.   melatonin 3 MG Tabs tablet TAKE 2 TABLETS BY MOUTH AT BEDTIME.   methocarbamol 500 MG tablet Commonly known as: ROBAXIN Take 500 mg by mouth daily.   metoprolol succinate 25 MG 24 hr tablet Commonly known as: TOPROL-XL TAKE ONE TABLET BY MOUTH ONCE DAILY   nitroGLYCERIN 0.4 MG SL tablet Commonly known as: NITROSTAT Place 1 tablet (0.4 mg total) under the tongue every 5 (five) minutes as needed for chest pain. What changed: reasons to take this   NovoLOG FlexPen 100 UNIT/ML FlexPen Generic drug: insulin aspart Inject 15 Units into the skin 3 (three) times daily with meals. What changed:   how much to take  when to take this   ondansetron 4 MG disintegrating tablet Commonly known as: ZOFRAN-ODT Take 1 tablet (4 mg total) by mouth every 8 (eight) hours as needed for nausea or vomiting.   pantoprazole 40 MG tablet Commonly known as: PROTONIX TAKE ONE TABLET BY MOUTH TWICE DAILY   spironolactone 25 MG tablet Commonly known as: ALDACTONE Take 12.5 mg by mouth daily.   torsemide 20 MG tablet Commonly known as: DEMADEX TAKE 2 TABLETS (40 MG TOTAL) BY MOUTH DAILY.       Discharge Instructions: Please refer to Patient Instructions section of EMR for full details.  Patient was counseled important signs and symptoms that should prompt return to medical care, changes in medications, dietary instructions, activity restrictions, and follow up appointments.   Follow-Up Appointments:   Future Appointments  Date Time Provider [place]  01/17/2020  3:00 PM [facility]-CASE MANAGER [facility] [place]  01/21/2020  8:50 AM Cleophas Dunker, DO [facility]  01/29/2020  2:00 PM Spero Geralds, MD [facility] None  02/04/2020  1:45 PM [facility] LAB [facility] None  02/04/2020  2:20 PM Kale, Cloria [place], MD [facility] None    Kathrin Ruddy, MD 01/17/2020, 2:27 PM PGY-1, [place] Upper-Level Resident Addendum I have discussed the above with the original author and agree with their documentation. My edits for correction/addition/clarification are included. Please see also any attending notes.   Wilber Oliphant, M.D.  PGY-3 01/17/2020 2:27 PM

## 2020-01-17 ENCOUNTER — Telehealth: Payer: Medicare Other

## 2020-01-17 ENCOUNTER — Telehealth: Payer: Self-pay

## 2020-01-17 NOTE — Telephone Encounter (Signed)
  Care Management   Outreach Note  01/17/2020 Name: Deborah [name] MRN: [id number] DOB: [birth year]  Referred by: Cleophas Dunker, DO Reason for referral : Appointment (DM II)   A second unsuccessful telephone outreach was attempted today. The patient was referred to the case management team for assistance with care management and care coordination.   Follow Up Plan: A HIPAA compliant phone message was left for the patient providing contact information and requesting a return call.  The care management team will reach out to the patient again over the next 7-14 days.   Lazaro Arms RN, BSN, [facility] Care Management Coordinator [place] Phone: [phone] Fax: [phone]

## 2020-01-19 NOTE — Progress Notes (Deleted)
    SUBJECTIVE:   CHIEF COMPLAINT / HPI:   Hospital follow-up Patient was admitted on 11/1-11/8 for abdominal pain with nausea and vomiting She was briefly on antibiotics, but discontinued after blood cultures and urine cultures returned negative GI was consulted during her admission and believed her pain was likely secondary to gastroparesis and to avoid Dilaudid usage ***  CHF She was diuresed throughout her admission and discharged on torsemide 40 mg daily ***  Acute on chronic kidney failure Creatinine is elevated to 1.22 on admission, maximum was 2.26 on 11/4, and improved prior to discharge with diuresis Creatinine on discharge was 1.95 Her baseline seems to be around 1.6-1.9 ***  PERTINENT  PMH / PSH: HTN, CHF, history of cardiac arrest, COPD, restrictive lung disease secondary to obesity, malignant carcinoid tumor of duodenum, anxiety  OBJECTIVE:   LMP 10/10/2012    Physical Exam: *** General: 54 y.o. female in NAD Cardio: RRR no m/r/g Lungs: CTAB, no wheezing, no rhonchi, no crackles, no IWOB on *** Abdomen: Soft, non-tender to palpation, non-distended, positive bowel sounds Skin: warm and dry Extremities: No edema   ASSESSMENT/PLAN:   No problem-specific Assessment & Plan notes found for this encounter.     Cleophas Dunker, [place]

## 2020-01-21 ENCOUNTER — Ambulatory Visit: Payer: Medicare Other | Admitting: Family Medicine

## 2020-01-23 ENCOUNTER — Inpatient Hospital Stay (HOSPITAL_COMMUNITY)
Admission: EM | Admit: 2020-01-23 | Discharge: 2020-01-28 | DRG: 641 | Disposition: A | Discharge status: Home / Self Care | Payer: Medicare Other | Attending: Internal Medicine | Admitting: Internal Medicine

## 2020-01-23 ENCOUNTER — Other Ambulatory Visit: Payer: Self-pay

## 2020-01-23 ENCOUNTER — Encounter (HOSPITAL_COMMUNITY): Payer: Self-pay | Admitting: *Deleted

## 2020-01-23 ENCOUNTER — Emergency Department (HOSPITAL_COMMUNITY): Payer: Medicare Other

## 2020-01-23 DIAGNOSIS — Z6841 Body Mass Index (BMI) 40.0 and over, adult: Secondary | ICD-10-CM | POA: Diagnosis not present

## 2020-01-23 DIAGNOSIS — I130 Hypertensive heart and chronic kidney disease with heart failure and stage 1 through stage 4 chronic kidney disease, or unspecified chronic kidney disease: Secondary | ICD-10-CM | POA: Diagnosis present

## 2020-01-23 DIAGNOSIS — Z20822 Contact with and (suspected) exposure to covid-19: Secondary | ICD-10-CM | POA: Diagnosis present

## 2020-01-23 DIAGNOSIS — G8929 Other chronic pain: Secondary | ICD-10-CM | POA: Diagnosis not present

## 2020-01-23 DIAGNOSIS — IMO0002 Reserved for concepts with insufficient information to code with codable children: Secondary | ICD-10-CM

## 2020-01-23 DIAGNOSIS — Z794 Long term (current) use of insulin: Secondary | ICD-10-CM | POA: Diagnosis not present

## 2020-01-23 DIAGNOSIS — M797 Fibromyalgia: Secondary | ICD-10-CM | POA: Diagnosis present

## 2020-01-23 DIAGNOSIS — R0602 Shortness of breath: Secondary | ICD-10-CM

## 2020-01-23 DIAGNOSIS — E872 Acidosis: Secondary | ICD-10-CM | POA: Diagnosis present

## 2020-01-23 DIAGNOSIS — E119 Type 2 diabetes mellitus without complications: Secondary | ICD-10-CM

## 2020-01-23 DIAGNOSIS — R112 Nausea with vomiting, unspecified: Secondary | ICD-10-CM | POA: Diagnosis not present

## 2020-01-23 DIAGNOSIS — E860 Dehydration: Principal | ICD-10-CM | POA: Diagnosis present

## 2020-01-23 DIAGNOSIS — Z886 Allergy status to analgesic agent status: Secondary | ICD-10-CM | POA: Diagnosis not present

## 2020-01-23 DIAGNOSIS — R109 Unspecified abdominal pain: Secondary | ICD-10-CM | POA: Diagnosis present

## 2020-01-23 DIAGNOSIS — I10 Essential (primary) hypertension: Secondary | ICD-10-CM | POA: Diagnosis not present

## 2020-01-23 DIAGNOSIS — I428 Other cardiomyopathies: Secondary | ICD-10-CM | POA: Diagnosis present

## 2020-01-23 DIAGNOSIS — E1165 Type 2 diabetes mellitus with hyperglycemia: Secondary | ICD-10-CM | POA: Diagnosis present

## 2020-01-23 DIAGNOSIS — Z8249 Family history of ischemic heart disease and other diseases of the circulatory system: Secondary | ICD-10-CM

## 2020-01-23 DIAGNOSIS — D6859 Other primary thrombophilia: Secondary | ICD-10-CM | POA: Diagnosis present

## 2020-01-23 DIAGNOSIS — Z888 Allergy status to other drugs, medicaments and biological substances status: Secondary | ICD-10-CM

## 2020-01-23 DIAGNOSIS — N179 Acute kidney failure, unspecified: Secondary | ICD-10-CM | POA: Diagnosis present

## 2020-01-23 DIAGNOSIS — E1140 Type 2 diabetes mellitus with diabetic neuropathy, unspecified: Secondary | ICD-10-CM

## 2020-01-23 DIAGNOSIS — E785 Hyperlipidemia, unspecified: Secondary | ICD-10-CM | POA: Diagnosis present

## 2020-01-23 DIAGNOSIS — G894 Chronic pain syndrome: Secondary | ICD-10-CM | POA: Diagnosis present

## 2020-01-23 DIAGNOSIS — A419 Sepsis, unspecified organism: Secondary | ICD-10-CM | POA: Diagnosis present

## 2020-01-23 DIAGNOSIS — I5022 Chronic systolic (congestive) heart failure: Secondary | ICD-10-CM | POA: Diagnosis present

## 2020-01-23 DIAGNOSIS — D519 Vitamin B12 deficiency anemia, unspecified: Secondary | ICD-10-CM | POA: Diagnosis present

## 2020-01-23 DIAGNOSIS — E1143 Type 2 diabetes mellitus with diabetic autonomic (poly)neuropathy: Secondary | ICD-10-CM | POA: Diagnosis present

## 2020-01-23 DIAGNOSIS — R0789 Other chest pain: Secondary | ICD-10-CM

## 2020-01-23 DIAGNOSIS — K219 Gastro-esophageal reflux disease without esophagitis: Secondary | ICD-10-CM | POA: Diagnosis present

## 2020-01-23 DIAGNOSIS — I5042 Chronic combined systolic (congestive) and diastolic (congestive) heart failure: Secondary | ICD-10-CM | POA: Diagnosis present

## 2020-01-23 DIAGNOSIS — C7A010 Malignant carcinoid tumor of the duodenum: Secondary | ICD-10-CM | POA: Diagnosis present

## 2020-01-23 DIAGNOSIS — Z9049 Acquired absence of other specified parts of digestive tract: Secondary | ICD-10-CM

## 2020-01-23 DIAGNOSIS — Z8711 Personal history of peptic ulcer disease: Secondary | ICD-10-CM

## 2020-01-23 DIAGNOSIS — Z885 Allergy status to narcotic agent status: Secondary | ICD-10-CM

## 2020-01-23 DIAGNOSIS — Z8673 Personal history of transient ischemic attack (TIA), and cerebral infarction without residual deficits: Secondary | ICD-10-CM | POA: Diagnosis not present

## 2020-01-23 DIAGNOSIS — Z880 Allergy status to penicillin: Secondary | ICD-10-CM

## 2020-01-23 DIAGNOSIS — K3184 Gastroparesis: Secondary | ICD-10-CM | POA: Diagnosis present

## 2020-01-23 DIAGNOSIS — Z7901 Long term (current) use of anticoagulants: Secondary | ICD-10-CM

## 2020-01-23 DIAGNOSIS — E876 Hypokalemia: Secondary | ICD-10-CM | POA: Diagnosis present

## 2020-01-23 DIAGNOSIS — Z833 Family history of diabetes mellitus: Secondary | ICD-10-CM

## 2020-01-23 DIAGNOSIS — I480 Paroxysmal atrial fibrillation: Secondary | ICD-10-CM | POA: Diagnosis present

## 2020-01-23 DIAGNOSIS — Z79899 Other long term (current) drug therapy: Secondary | ICD-10-CM

## 2020-01-23 DIAGNOSIS — M4807 Spinal stenosis, lumbosacral region: Secondary | ICD-10-CM | POA: Diagnosis present

## 2020-01-23 DIAGNOSIS — D649 Anemia, unspecified: Secondary | ICD-10-CM | POA: Diagnosis present

## 2020-01-23 DIAGNOSIS — I5023 Acute on chronic systolic (congestive) heart failure: Secondary | ICD-10-CM | POA: Diagnosis not present

## 2020-01-23 LAB — COMPREHENSIVE METABOLIC PANEL
ALT: 19 U/L (ref 0–44)
AST: 21 U/L (ref 15–41)
Albumin: 3.3 g/dL — ABNORMAL LOW (ref 3.5–5.0)
Alkaline Phosphatase: 94 U/L (ref 38–126)
Anion gap: 17 — ABNORMAL HIGH (ref 5–15)
BUN: 19 mg/dL (ref 6–20)
CO2: 19 mmol/L — ABNORMAL LOW (ref 22–32)
Calcium: 8.7 mg/dL — ABNORMAL LOW (ref 8.9–10.3)
Chloride: 105 mmol/L (ref 98–111)
Creatinine, Ser: 1.39 mg/dL — ABNORMAL HIGH (ref 0.44–1.00)
GFR, Estimated: 45 mL/min — ABNORMAL LOW (ref >60)
Glucose, Bld: 265 mg/dL — ABNORMAL HIGH (ref 70–99)
Potassium: 2.8 mmol/L — ABNORMAL LOW (ref 3.5–5.1)
Sodium: 141 mmol/L (ref 135–145)
Total Bilirubin: 0.7 mg/dL (ref 0.3–1.2)
Total Protein: 7.5 g/dL (ref 6.5–8.1)

## 2020-01-23 LAB — CBC
HCT: 32.4 % — ABNORMAL LOW (ref 36.0–46.0)
Hemoglobin: 10.3 g/dL — ABNORMAL LOW (ref 12.0–15.0)
MCH: 27.7 pg (ref 26.0–34.0)
MCHC: 31.8 g/dL (ref 30.0–36.0)
MCV: 87.1 fL (ref 80.0–100.0)
Platelets: 521 10*3/uL — ABNORMAL HIGH (ref 150–400)
RBC: 3.72 MIL/uL — ABNORMAL LOW (ref 3.87–5.11)
RDW: 15.7 % — ABNORMAL HIGH (ref 11.5–15.5)
WBC: 15.2 10*3/uL — ABNORMAL HIGH (ref 4.0–10.5)
nRBC: 0 % (ref 0.0–0.2)

## 2020-01-23 LAB — RESPIRATORY PANEL BY RT PCR (FLU A&B, COVID)
Influenza A by PCR: NEGATIVE
Influenza B by PCR: NEGATIVE
SARS Coronavirus 2 by RT PCR: NEGATIVE

## 2020-01-23 LAB — MAGNESIUM: Magnesium: 1.3 mg/dL — ABNORMAL LOW (ref 1.7–2.4)

## 2020-01-23 LAB — BRAIN NATRIURETIC PEPTIDE: B Natriuretic Peptide: 165 pg/mL — ABNORMAL HIGH (ref 0.0–100.0)

## 2020-01-23 LAB — TROPONIN I (HIGH SENSITIVITY)
Troponin I (High Sensitivity): 26 ng/L — ABNORMAL HIGH (ref <18)
Troponin I (High Sensitivity): 36 ng/L — ABNORMAL HIGH (ref <18)

## 2020-01-23 LAB — LIPASE, BLOOD: Lipase: 32 U/L (ref 11–51)

## 2020-01-23 LAB — LACTIC ACID, PLASMA
Lactic Acid, Venous: 2.7 mmol/L (ref 0.5–1.9)
Lactic Acid, Venous: 4.5 mmol/L (ref 0.5–1.9)

## 2020-01-23 MED ORDER — POTASSIUM CHLORIDE 10 MEQ/100ML IV SOLN
10.0000 meq | INTRAVENOUS | Status: AC
  Administered 2020-01-23 (×2): 10 meq via INTRAVENOUS
  Filled 2020-01-23 (×2): qty 100

## 2020-01-23 MED ORDER — SODIUM CHLORIDE 0.9 % IV SOLN: 2.0000 g | Freq: Three times a day (TID) | INTRAVENOUS | Status: DC

## 2020-01-23 MED ORDER — HYDROMORPHONE HCL 1 MG/ML IJ SOLN
1.0000 mg | Freq: Once | INTRAMUSCULAR | Status: AC
  Administered 2020-01-23: 1 mg via INTRAVENOUS
  Filled 2020-01-23: qty 1

## 2020-01-23 MED ORDER — HALOPERIDOL LACTATE 5 MG/ML IJ SOLN
2.0000 mg | Freq: Once | INTRAMUSCULAR | Status: AC
  Administered 2020-01-23: 2 mg via INTRAVENOUS
  Filled 2020-01-23: qty 1

## 2020-01-23 MED ORDER — SODIUM CHLORIDE 0.9 % IV SOLN
2.0000 g | Freq: Once | INTRAVENOUS | Status: AC
  Administered 2020-01-23: 2 g via INTRAVENOUS
  Filled 2020-01-23: qty 20

## 2020-01-23 MED ORDER — POTASSIUM CHLORIDE CRYS ER 20 MEQ PO TBCR
40.0000 meq | EXTENDED_RELEASE_TABLET | Freq: Once | ORAL | Status: AC
  Administered 2020-01-23: 40 meq via ORAL
  Filled 2020-01-23: qty 2

## 2020-01-23 MED ORDER — LACTATED RINGERS IV BOLUS (SEPSIS)
1000.0000 mL | Freq: Once | INTRAVENOUS | Status: AC
  Administered 2020-01-23: 1000 mL via INTRAVENOUS

## 2020-01-23 MED ORDER — MAGNESIUM SULFATE 2 GM/50ML IV SOLN
2.0000 g | Freq: Once | INTRAVENOUS | Status: AC
  Administered 2020-01-23: 2 g via INTRAVENOUS
  Filled 2020-01-23: qty 50

## 2020-01-23 MED ORDER — LACTATED RINGERS IV BOLUS (SEPSIS)
1000.0000 mL | Freq: Once | INTRAVENOUS | Status: AC
  Administered 2020-01-23 (×2): 1000 mL via INTRAVENOUS

## 2020-01-23 MED ORDER — SODIUM CHLORIDE 0.9 % IV SOLN
2.0000 g | Freq: Three times a day (TID) | INTRAVENOUS | Status: DC
  Administered 2020-01-24 (×2): 2 g via INTRAVENOUS
  Filled 2020-01-23 (×3): qty 2

## 2020-01-23 MED ORDER — METRONIDAZOLE 500 MG PO TABS
500.0000 mg | ORAL_TABLET | Freq: Three times a day (TID) | ORAL | Status: DC
  Administered 2020-01-23 – 2020-01-24 (×2): 500 mg via ORAL
  Filled 2020-01-23 (×2): qty 1

## 2020-01-23 MED ORDER — SODIUM CHLORIDE 0.9 % IV BOLUS
500.0000 mL | Freq: Once | INTRAVENOUS | Status: AC
  Administered 2020-01-23: 500 mL via INTRAVENOUS

## 2020-01-23 MED ORDER — LACTATED RINGERS IV SOLN: INTRAVENOUS | Status: AC

## 2020-01-23 MED ORDER — ONDANSETRON HCL 4 MG/2ML IJ SOLN
4.0000 mg | Freq: Once | INTRAMUSCULAR | Status: AC
  Administered 2020-01-23: 4 mg via INTRAVENOUS
  Filled 2020-01-23: qty 2

## 2020-01-23 NOTE — Sepsis Progress Note (Signed)
Sepsis Protocol being followed by eLink

## 2020-01-23 NOTE — Sepsis Progress Note (Signed)
Notified bedside nurse of need to draw and administer blood cultures and fluid bolus.

## 2020-01-23 NOTE — Progress Notes (Signed)
Pharmacy Antibiotic Note  Deborah [name] is a 54 y.o. female admitted on 01/23/2020 with UTI.  Pharmacy has been consulted for cefepime dosing.  Plan: Cefepime 2g IV q8 per current renal function     Temp (24hrs), Avg:99 F (37.2 C), Min:98.6 F (37 C), Max:99.3 F (37.4 C)  Recent Labs  Lab 01/23/20 1315 01/23/20 1400 01/23/20 1515 01/23/20 1652  WBC 15.2*  --   --   --   CREATININE  --  1.39*  --   --   LATICACIDVEN  --   --  2.7* 4.5*    Estimated Creatinine Clearance: 69.5 mL/min (A) (by C-G formula based on SCr of 1.39 mg/dL (H)).    Allergies  Allergen Reactions  . Diazepam [name]  . Isovue [Iopamidol] Anaphylaxis    11/28/17 Patient had seizure like activity and then 1 min code after 100 cc of isovue 300. Possible contrast allergy vs vasovagal episode  . Lisinopril Anaphylaxis    Tongue and mouth swelling  . Metoclopramide Other (See Comments)    Tardive dyskinesia  . Penicillins Palpitations    Has patient had a PCN reaction causing immediate rash, facial/tongue/throat swelling, SOB or lightheadedness with hypotension: Yes, heart races Has patient had a PCN reaction causing severe rash involving mucus membranes or skin necrosis: No Has patient had a PCN reaction that required hospitalization: Yes  Has patient had a PCN reaction occurring within the last 10 years: No   . Tolmetin Nausea Only and Other (See Comments)    ULCER  . Aspirin Other (See Comments)    Irritates stomach ulcer   . Nsaids Other (See Comments)    ULCER  . Acetaminophen Nausea Only and Other (See Comments)    Irritates stomach ulcer; Abdominal pain  . Tramadol Nausea And Vomiting     Thank you for allowing pharmacy to be a part of this patient's care.  Deborah [name] 01/23/2020 6:57 PM

## 2020-01-23 NOTE — ED Notes (Signed)
Attempted to give report 

## 2020-01-23 NOTE — ED Triage Notes (Signed)
Pt complains of chest pain, abdominal pain, shortness of breath since this morning. Pt vomiting clear liquid in triage.

## 2020-01-23 NOTE — ED Notes (Signed)
Pt denies current N/V and is requesting food

## 2020-01-23 NOTE — ED Notes (Signed)
Lab states needs re-collect on light green tube

## 2020-01-23 NOTE — ED Provider Notes (Signed)
Physical Exam  BP 124/72   Pulse (!) 107   Temp 98.6 F (37 C) (Rectal)   Resp (!) 23   LMP 10/10/2012   SpO2 99%   Physical Exam Vitals and nursing note reviewed.  Constitutional:      General: She is not in acute distress.    Appearance: She is obese. She is not ill-appearing or toxic-appearing.  HENT:     Head: Normocephalic and atraumatic.     Nose: Nose normal.     Mouth/Throat:     Mouth: Mucous membranes are moist.     Pharynx: Oropharynx is clear. Uvula midline. No oropharyngeal exudate or posterior oropharyngeal erythema.  Eyes:     General:        Right eye: No discharge.        Left eye: No discharge.     Extraocular Movements: Extraocular movements intact.     Conjunctiva/sclera: Conjunctivae normal.     Pupils: Pupils are equal, round, and reactive to light.  Cardiovascular:     Rate and Rhythm: Regular rhythm. Tachycardia present.     Pulses: Normal pulses.     Heart sounds: Normal heart sounds. No murmur heard.   Pulmonary:     Effort: Pulmonary effort is normal. No respiratory distress.     Breath sounds: Normal breath sounds. No wheezing or rales.  Abdominal:     General: Bowel sounds are normal. There is no distension.     Palpations: Abdomen is soft.     Tenderness: There is generalized abdominal tenderness. There is no right CVA tenderness, left CVA tenderness, guarding or rebound.  Musculoskeletal:        General: No deformity. Normal range of motion.     Cervical back: Neck supple.     Right lower leg: 2+ Edema present.     Left lower leg: 2+ Edema present.  Lymphadenopathy:     Cervical: No cervical adenopathy.  Skin:    General: Skin is warm and dry.     Capillary Refill: Capillary refill takes less than 2 seconds.     Findings: No signs of injury, laceration, rash or wound.  Neurological:     General: No focal deficit present.     Mental Status: She is alert and oriented to person, place, and time. Mental status is at baseline.      Sensory: Sensation is intact.     Motor: Motor function is intact.  Psychiatric:        Mood and Affect: Mood normal.     ED Course/Procedures     Procedures  MDM   Care of this patient was assumed by me at time of shift change from preceding ED provider Alfredia Client, PA-C.  Patient with history of gastroparesis, heart failure who presents today with abdominal pain, shortness of breath, and chest pain.  Patient reporting history of multiple episodes of emesis this morning.  Patient dependent on Dilaudid at home for pain control, however gastroenterology notes request that she does not receive Dilaudid as it can exacerbate her gastroparesis.  Patient states that she has Dilaudid at home, takes it daily.  Per preceding provider patient has been tachycardic throughout her stay in the emergency department.   Patient underwent CT abdomen pelvis on 01/11/2019 Without acute finding, chronic intramural fatty deposition within the colon which may be seen in setting of chronic inflammation.  She was subsequently admitted to the hospital for pain, nausea, vomiting-states that she frequently requires admission to the hospital  for resolution of her symptoms.  Etiology was not felt to be infectious at that time, GI felt symptoms were secondary to gastroparesis.  Patient additionally with history of hypertension, diabetes type 2, CKD, GERD, depression, COPD, fatty liver, carcinoid tumor of the duodenum..    Elevated BNP of 165, at patient's baseline.    Troponin elevated to 26, at patient's baseline.  CBC with elevated white count 15.2, hemoglobin 10.3 (previously 8.5).  CMP with hypokalemia to 2.8, patient continues to receive IV repletion.  Will p.o. trial with oral potassium.  Additionally patient has mildly elevated anion gap to 17.  Lactic acid pending at this time  Creatinine 1.39, patient's baseline.  Lipase normal, 32.  Chest x-ray negative for acute cardiopulmonary disease.  EKG  with sinus tachycardia, no STEMI.  Respiratory pathogen panel pending.  At time of shift change, preceding the ED provider recommended trending troponin, awaiting lactic acid resolved, Covid test.  Assuming remaining laboratory studies look good patient may be discharged home, with recommendation for close follow-up with her gastroenterologist in cessation of her Dilaudid use at home.   Critical lab result called to me by nursing staff, lactic acid elevated to 2.7.  Magnesium low 1.3, will replete IV.   At time of my evaluation the patient is resting comfortably in her hospital bed, complains that her abdominal pain is persistent, worsening, and was not relieved with Haldol.  She is adamant that she needs Dilaudid for pain, stating that gastroenterologist who saw her inpatient should not have a say in her treatment.  Patient also adamant she does not want to be admitted to the hospital, does not want CT scan of her belly.  At time of my initial examination of the patient, abdomen is generally tender to palpation for me, Continues to be tachycardic to the 110s on the monitor, while lying still. Patient with lower extremity edema consistent with her known history of heart failure, states she continues to take her torsemide at home.  Patient receiving second bag of IV potassium repletion, oral potassium repletion at time of my exam.  Will recheck her lactic acid, still attempting to collect her urinalysis.  Repeat lactic acid has increased to 4.5, will enact code sepsis as this patient's presentation has evolved into a septic picture.   We will start on antibiotic coverage for intra-abdominal septic source, will begin IV fluid resuscitation at 30 mL/kg.  I personally spoke on the phone with the pharmacist, who confirmed that this patient has tolerated Rocephin in the past, will proceed with Rocephin despite known penicillin allergy, given this data, oral metronidazole given system wide  shortage.  Discussed new laboratory findings with the patient and her husband who is at the bedside, they are agreeable to admission to the hospital for further work-up and treatment.  Patient continues to refuse CT of the abdomen and pelvis.  Hospitalist consulted, Dr. Jonelle Sidle, who is agreeable to seeing this patient and admitting her to his service. Appreciate his collaboration in the care of this patient.       Emeline Darling, PA-C 01/23/20 1846    Daleen Bo, MD 01/23/20 2330

## 2020-01-23 NOTE — ED Notes (Signed)
Pt instructed to give urine sample. Pt on bedside commode. Pt contaminated urine sample with stool

## 2020-01-23 NOTE — ED Notes (Signed)
Pt is difficult IV stick. Pt stuck twice for blood cultures. Unable to obtain blood cultures. Will start IV fluid bolus and antibiotic before blood cultures

## 2020-01-23 NOTE — ED Provider Notes (Signed)
[facility] DEPT Provider Note   Deborah [name] Arrival date & time: 01/23/20  1133     History Chief Complaint  Patient presents with  . Abdominal Pain  . Emesis  . Chest Pain    Deborah [name] is a 54 y.o. female with past medical history of hypertension, hyperlipidemia, obesity, A. fib on Eliquis, diabetes, CHF,carcinoid tumor of the duodenum that presents to the emergency department today for acute onset of abdominal pain, shortness of breath and chest pain that began this morning.  Patient states that she was doing fine yesterday, woke up this morning with the symptoms.  States that abdominal pain is generalized, tender to abdomen and chest as well.  Per chart review patient was recently discharged from the hospital about 9 days ago for similar presentation.  At that time abdominal CT which is obtained which showed no acute findings, they were antibiotics started however they were stopped on blood cultures returned negative.  Sepsis was ruled out, GI was consulted and they thought the pain was due to gastroparesis.  Patient has had similar presentation over the past 6 months with multiple negative CT abdomen pelvis scans.  Patient is not on oxygen at home.  States that she has been compliant with her medications.  States that she has vomited multiple times this morning, no hematemesis or bilious emesis.  Denies any diarrhea.  Patient denies any fevers, chills, URI symptoms, cough.  Denies any sick contacts.  Patient states that she takes Dilaudid every day for her gastroparesis, has not been working.  States that she needs IV pain medication for her pain at this time.  Denies any dysuria, hematuria.  Denies any vaginal complaints.  HPI     Past Medical History:  Diagnosis Date  . Acute back pain with sciatica, left   . Acute back pain with sciatica, right   . AKI (acute kidney injury) ([place])   . Anemia, unspecified   . Cancer ([place])   . Carcinoid  tumor of duodenum   . Chronic pain   . Chronic systolic CHF (congestive heart failure) ([place])   . Diabetes mellitus   . Esophageal reflux   . Fibromyalgia   . Gastric ulcer   . Gastroparesis   . Gout   . Hyperlipidemia   . Hypertension   . Lumbosacral stenosis   . NICM (nonischemic cardiomyopathy) ([place])   . Obesity   . PAF (paroxysmal atrial fibrillation) ([place])   . Stroke ([place]) 02/2011  . Vitamin B12 deficiency anemia     Patient Active Problem List   Diagnosis Date Noted  . Vaginal yeast infection 01/04/2020  . AKI (acute kidney injury) ([place]) 11/27/2019  . Chronic pain 09/04/2019  . Malignant carcinoid tumor of duodenum ([place])   . Nonalcoholic fatty liver disease 06/05/2019  . Chronic diarrhea   . COPD exacerbation ([place]) 05/08/2019  . Acute on chronic HFrEF (heart failure with reduced ejection fraction) ([place])   . Restrictive lung disease secondary to obesity   . History of gastric ulcer   . Uncontrolled type 2 diabetes mellitus with hyperglycemia ([place])   . Fibroid uterus 02/23/2019  . Chronic systolic CHF (congestive heart failure) ([place]) 12/19/2018  . Intractable nausea and vomiting 12/17/2018  . Hypoxia 12/17/2018  . Abdominal pain 07/17/2018  . Cardiac arrest ([place]) 11/29/2017  . Leukocytosis 11/29/2017  . Anxiety 11/29/2017  . Stroke (cerebrum) ([place]) 03/19/2017  . GERD (gastroesophageal reflux disease) 03/19/2017  . Depression 03/19/2017  . Morbid obesity (  [place])   . Normocytic normochromic anemia 08/16/2016  . Diabetic gastroparesis ([place]) 06/05/2016  . Gout 06/05/2016  . Nonspecific chest pain 09/26/2015  . Hypokalemia 09/26/2015  . Hypomagnesemia 09/26/2015  . Nausea and vomiting 08/20/2015  . DKA (diabetic ketoacidoses) 05/25/2015  . Uncontrolled type 2 diabetes mellitus with diabetic neuropathy, with long-term current use of insulin ([place]) 05/25/2015  . Type 2 diabetes mellitus with hyperglycemia, with long-term current use of insulin ([place]) 05/25/2015  . CKD  (chronic kidney disease), stage II 05/25/2015  . Essential hypertension, benign 09/28/2013    Past Surgical History:  Procedure Laterality Date  . BIOPSY  07/27/2019   Procedure: BIOPSY;  Surgeon: Clarene Essex, MD;  Location: [facility];  Service: Endoscopy;;  . BIOPSY  07/30/2019   Procedure: BIOPSY;  Surgeon: Otis Brace, MD;  Location: [facility];  Service: Gastroenterology;;  . CATARACT EXTRACTION  01/2014  . CHOLECYSTECTOMY    . COLONOSCOPY WITH PROPOFOL N/A 07/30/2019   Procedure: COLONOSCOPY WITH PROPOFOL;  Surgeon: Otis Brace, MD;  Location: [facility];  Service: Gastroenterology;  Laterality: N/A;  . ESOPHAGOGASTRODUODENOSCOPY N/A 07/27/2019   Procedure: ESOPHAGOGASTRODUODENOSCOPY (EGD);  Surgeon: Clarene Essex, MD;  Location: [facility];  Service: Endoscopy;  Laterality: N/A;  . ESOPHAGOGASTRODUODENOSCOPY (EGD) WITH PROPOFOL N/A 08/02/2019   Procedure: ESOPHAGOGASTRODUODENOSCOPY (EGD) WITH PROPOFOL;  Surgeon: Otis Brace, MD;  Location: [facility];  Service: Gastroenterology;  Laterality: N/A;  . HEMOSTASIS CLIP PLACEMENT  08/02/2019   Procedure: HEMOSTASIS CLIP PLACEMENT;  Surgeon: Otis Brace, MD;  Location: [facility];  Service: Gastroenterology;;  . POLYPECTOMY  07/30/2019   Procedure: POLYPECTOMY;  Surgeon: Otis Brace, MD;  Location: [facility];  Service: Gastroenterology;;  . POLYPECTOMY  08/02/2019   Procedure: POLYPECTOMY;  Surgeon: Otis Brace, MD;  Location: [facility];  Service: Gastroenterology;;     OB History    Gravida  0   Para  0   Term  0   Preterm  0   AB  0   Living  0     SAB  0   TAB  0   Ectopic  0   Multiple  0   Live Births  0           Family History  Problem Relation Age of Onset  . Diabetes Mother   . Diabetes Father   . Heart disease Father   . Diabetes Sister   . Congestive Heart Failure Sister 5  . Diabetes Brother     Social History   Tobacco Use  . Smoking status:  Never Smoker  . Smokeless tobacco: Never Used  Vaping Use  . Vaping Use: Never used  Substance Use Topics  . Alcohol use: No  . Drug use: No    Home Medications Prior to Admission medications   Medication Sig Start Date End Date Taking? Authorizing Provider  albuterol (PROVENTIL) (2.5 MG/3ML) 0.083% nebulizer solution Take 3 mLs (2.5 mg total) by nebulization every 6 (six) hours as needed for wheezing or shortness of breath. 04/06/19   Scot Jun, FNP  allopurinol (ZYLOPRIM) 100 MG tablet Take 100 mg by mouth daily.    [provider]  ALPRAZolam Duanne Moron) 1 MG tablet Take 1 tablet (1 mg total) by mouth 2 (two) times daily as needed for anxiety. 09/03/19   Lockamy, Christia Reading, DO  atorvastatin (LIPITOR) 10 MG tablet TAKE ONE TABLET BY MOUTH ONCE DAILY (AM) Patient taking differently: Take 10 mg by mouth daily.  11/14/19   Meccariello, Bernita Raisin, DO  blood glucose meter kit and supplies KIT Dispense based on patient and insurance preference. Use up to four times daily as directed. (FOR ICD-9 250.00, 250.01). 12/13/18   [name], DO  cetirizine (ZYRTEC) 10 MG tablet TAKE ONE TABLET BY MOUTH ONCE DAILY (AM) Patient taking differently: Take 10 mg by mouth daily.  11/14/19   Meccariello, Bernita Raisin, DO  colchicine 0.6 MG tablet Take 2 tablets at 1 time (1.2 mg total) and 1 hour later take 1 tablet Patient taking differently: Take 0.6 mg by mouth See admin instructions. Take 2 tablets (1.$RemoveBefo'2mg'gRDdYjeSjfH$  totally) by mouth at 1 time and 1 hour later take 1 tablet (0.6 mg totally) as needed for gout flares 01/04/20   Gifford Shave, MD  DULoxetine HCl 40 MG CPEP Take 40 mg by mouth in the morning and at bedtime.    [provider]  EASY COMFORT PEN NEEDLES 31G X 5 MM MISC USE 3 TIMES A DAY FOR INSULIN ADMINISTRATION 11/14/19   Meccariello, Mel Almond J, DO  ELIQUIS 5 MG TABS tablet TAKE 1 TABLET BY MOUTH 2 (TWO) TIMES DAILY. Patient taking differently: Take 5 mg by mouth 2 (two) times daily.   12/20/19   Meccariello, Bernita Raisin, DO  FARXIGA 10 MG TABS tablet Take 10 mg by mouth daily. 10/02/19   [provider]  ferrous sulfate 325 (65 FE) MG tablet Take 1 tablet (325 mg total) by mouth daily with breakfast. 09/01/19 01/08/20  Alma Friendly, MD  fluticasone (FLONASE) 50 MCG/ACT nasal spray Place 2 sprays into both nostrils daily as needed for allergies or rhinitis. 12/19/18   Rai, Ripudeep K, MD  hydrALAZINE (APRESOLINE) 25 MG tablet TAKE 1 TABLET BY MOUTH 3 (THREE) TIMES DAILY. FOR BLOOD PRESSURE Patient taking differently: Take 25 mg by mouth 3 (three) times daily.  10/02/19   Meccariello, Bernita Raisin, DO  HYDROmorphone (DILAUDID) 2 MG tablet TAKE ONE-HALF TABLETS (1 MG TOTAL) BY MOUTH DAILY AS NEEDED FOR SEVERE PAIN. Patient taking differently: Take 1 mg by mouth daily as needed for severe pain.  12/17/19   Raulkar, Clide Deutscher, MD  hydrOXYzine (ATARAX/VISTARIL) 10 MG tablet Take 1 tablet (10 mg total) by mouth 3 (three) times daily as needed. Patient taking differently: Take 10 mg by mouth 3 (three) times daily as needed for itching or anxiety.  11/15/19   Meccariello, Bernita Raisin, DO  insulin aspart (NOVOLOG FLEXPEN) 100 UNIT/ML FlexPen Inject 15 Units into the skin 3 (three) times daily with meals. Patient taking differently: Inject 30 Units into the skin in the morning, at noon, in the evening, and at bedtime.  06/26/19   Mariel Aloe, MD  isosorbide mononitrate (IMDUR) 60 MG 24 hr tablet TAKE 1 TABLET (60 MG TOTAL) BY MOUTH DAILY. 12/21/19   Meccariello, Bernita Raisin, DO  LANTUS SOLOSTAR 100 UNIT/ML Solostar Pen Inject 50 Units into the skin at bedtime. 11/15/19   Meccariello, Mel Almond J, DO  melatonin 3 MG TABS tablet TAKE 2 TABLETS BY MOUTH AT BEDTIME. Patient taking differently: Take 6 mg by mouth at bedtime.  12/21/19   Meccariello, Bernita Raisin, DO  methocarbamol (ROBAXIN) 500 MG tablet Take 500 mg by mouth daily.     [provider]  metoprolol succinate (TOPROL-XL) 25 MG 24  hr tablet TAKE ONE TABLET BY MOUTH ONCE DAILY Patient taking differently: Take 25 mg by mouth daily.  12/21/19   Meccariello, Bernita Raisin, DO  nitroGLYCERIN (NITROSTAT) 0.4 MG SL tablet Place 1 tablet (0.4 mg total) under  the tongue every 5 (five) minutes as needed for chest pain. Patient taking differently: Place 0.4 mg under the tongue every 5 (five) minutes as needed for chest pain (max 3 doses).  12/13/18   Lockamy, Christia Reading, DO  ondansetron (ZOFRAN-ODT) 4 MG disintegrating tablet Take 1 tablet (4 mg total) by mouth every 8 (eight) hours as needed for nausea or vomiting. 08/09/19   Rai, Ripudeep K, MD  pantoprazole (PROTONIX) 40 MG tablet TAKE ONE TABLET BY MOUTH TWICE DAILY Patient taking differently: Take 40 mg by mouth 2 (two) times daily.  09/04/19   [name], DO  spironolactone (ALDACTONE) 25 MG tablet Take 12.5 mg by mouth daily. 12/24/19   [provider]  torsemide (DEMADEX) 20 MG tablet TAKE 2 TABLETS (40 MG TOTAL) BY MOUTH DAILY. 12/20/19   Meccariello, Bernita Raisin, DO    Allergies    Diazepam, Isovue [iopamidol], Lisinopril, Metoclopramide, Penicillins, Tolmetin, Aspirin, Nsaids, Acetaminophen, and Tramadol  Review of Systems   Review of Systems  Constitutional: Negative for chills, diaphoresis, fatigue and fever.  HENT: Negative for congestion, sore throat and trouble swallowing.   Eyes: Negative for pain and visual disturbance.  Respiratory: Negative for cough, shortness of breath and wheezing.   Cardiovascular: Positive for chest pain and leg swelling. Negative for palpitations.  Gastrointestinal: Positive for abdominal pain, nausea and vomiting. Negative for abdominal distention and diarrhea.  Genitourinary: Negative for difficulty urinating.  Musculoskeletal: Negative for back pain, neck pain and neck stiffness.  Skin: Negative for pallor.  Neurological: Negative for dizziness, speech difficulty, weakness and headaches.  Psychiatric/Behavioral: Negative for  confusion.    Physical Exam Updated Vital Signs BP 117/70   Pulse (!) 114   Temp 98.6 F (37 C) (Rectal)   Resp 19   LMP 10/10/2012   SpO2 100%   Physical Exam Constitutional:      General: She is not in acute distress.    Appearance: Normal appearance. She is obese. She is not ill-appearing, toxic-appearing or diaphoretic.     Comments: Obese female hyperventilating on exam, no true respiratory distress.  Satting at 100% on no oxygen.  HENT:     Mouth/Throat:     Mouth: Mucous membranes are moist.     Pharynx: Oropharynx is clear.  Eyes:     General: No scleral icterus.    Extraocular Movements: Extraocular movements intact.     Pupils: Pupils are equal, round, and reactive to light.  Cardiovascular:     Rate and Rhythm: Regular rhythm. Tachycardia present.     Pulses: Normal pulses.     Heart sounds: Normal heart sounds.  Pulmonary:     Effort: Pulmonary effort is normal. No respiratory distress.     Breath sounds: Normal breath sounds. No stridor. No wheezing, rhonchi or rales.     Comments: Tenderness to palpation of entire chest. Chest:     Chest wall: No tenderness.  Abdominal:     General: Abdomen is flat. There is no distension.     Palpations: Abdomen is soft.     Tenderness: There is generalized abdominal tenderness. There is no guarding or rebound.     Comments: Diffusely tender throughout abdomen extending up into chest.  No ecchymosis or rashes noted.  Musculoskeletal:        General: No swelling or tenderness. Normal range of motion.     Cervical back: Normal range of motion and neck supple. No rigidity.     Right lower leg: No edema.  Left lower leg: No edema.  Skin:    General: Skin is warm and dry.     Capillary Refill: Capillary refill takes less than 2 seconds.     Coloration: Skin is not pale.  Neurological:     General: No focal deficit present.     Mental Status: She is alert and oriented to person, place, and time.  Psychiatric:         Mood and Affect: Mood normal.        Behavior: Behavior normal.     ED Results / Procedures / Treatments   Labs (all labs ordered are listed, but only abnormal results are displayed) Labs Reviewed  CBC - Abnormal; Notable for the following components:      Result Value   WBC 15.2 (*)    RBC 3.72 (*)    Hemoglobin 10.3 (*)    HCT 32.4 (*)    RDW 15.7 (*)    Platelets 521 (*)    All other components within normal limits  BRAIN NATRIURETIC PEPTIDE - Abnormal; Notable for the following components:   B Natriuretic Peptide 165.0 (*)    All other components within normal limits  COMPREHENSIVE METABOLIC PANEL - Abnormal; Notable for the following components:   Potassium 2.8 (*)    CO2 19 (*)    Glucose, Bld 265 (*)    Creatinine, Ser 1.39 (*)    Calcium 8.7 (*)    Albumin 3.3 (*)    GFR, Estimated 45 (*)    Anion gap 17 (*)    All other components within normal limits  TROPONIN I (HIGH SENSITIVITY) - Abnormal; Notable for the following components:   Troponin I (High Sensitivity) 26 (*)    All other components within normal limits  RESPIRATORY PANEL BY RT PCR (FLU A&B, COVID)  LIPASE, BLOOD  LACTIC ACID, PLASMA  LACTIC ACID, PLASMA  URINALYSIS, ROUTINE W REFLEX MICROSCOPIC  TROPONIN I (HIGH SENSITIVITY)    EKG EKG Interpretation  Date/Time:  Wednesday January 23 2020 11:47:15 EST Ventricular Rate:  115 PR Interval:    QRS Duration: 83 QT Interval:  348 QTC Calculation: 482 R Axis:   -38 Text Interpretation: Sinus tachycardia Left ventricular hypertrophy 12 Lead; [place] Confirmed by Davonna Belling [phone]) on 01/23/2020 1:54:25 PM Also confirmed by Davonna Belling [phone])  on 01/23/2020 2:42:54 PM   Radiology DG Chest Port 1 View  Result Date: 01/23/2020 CLINICAL DATA:  [name].  Chest pain. EXAM: PORTABLE CHEST 1 VIEW COMPARISON:  January 11, 2020. FINDINGS: The heart size and mediastinal contours are within normal limits. No consolidation. No  visible pleural effusions or pneumothorax. No acute osseous abnormality. IMPRESSION: No acute cardiopulmonary disease. Electronically Signed   By: Margaretha Sheffield MD   On: 01/23/2020 13:41    Procedures Procedures (including critical care time)  Medications Ordered in ED Medications  potassium chloride 10 mEq in 100 mL IVPB (10 mEq Intravenous New Bag/Given 01/23/20 1516)  ondansetron (ZOFRAN) injection 4 mg (4 mg Intravenous Given 01/23/20 1322)  HYDROmorphone (DILAUDID) injection 1 mg (1 mg Intravenous Given 01/23/20 1323)  sodium chloride 0.9 % bolus 500 mL (0 mLs Intravenous Stopped 01/23/20 1505)  haloperidol lactate (HALDOL) injection 2 mg (2 mg Intravenous Given 01/23/20 1513)    ED Course  I have reviewed the triage vital signs and the nursing notes.  Pertinent labs & imaging results that were available during my care of the patient were reviewed by me and considered in my medical  decision making (see chart for details).    MDM Rules/Calculators/A&P                          GIANNAH ZAVADIL is a 54 y.o. female with past medical history of hypertension, hyperlipidemia, obesity, A. fib on Eliquis, diabetes, CHF,carcinoid tumor of the duodenum that presents to the emergency department today for acute onset of abdominal pain, shortness of breath and chest pain that began this morning.  Patient has been seen multiple times for this, last discharged 9 days ago however will obtain basic work-up at this time and control pain, Dilaudid given.  Did read GI note from last discharge 9 days ago that patient's pain is most likely due to gastroparesis and patient should not be  Given Dilaudid since this does worsen disease process.  I did express that to the patient and she states that she takes Dilaudid at home daily and Dilaudid IV as the only thing that helps her pain.  Did discuss that we will not  be giving her any more of this in the emergency department, will try Haldol at this  time.  Work-up today with CMP remarkable for potassium 2.8, CO2 19, glucose 265, creatinine 1.39 and anion gap of 17.  CBC remarkable for white count of 15.2 with hemoglobin of 10.3.  BNP 165.  Creatinine does appear to be patient's baseline.  Chest x-ray without any fluids, interpreted me without any acute cardiopulmonary disease.  Hemoglobin at baseline as well.  Troponin at 26, is also patient's baseline.  Will obtain a lactic acid in regards to anion gap.  We will also replete potassium here.  Shared decision making about CT scans at this time, we decided that this is not necessary since pain has severely improved with Dilaudid and patient has received multiple negative CT scans in the past.  Patient states that she does not want a CT scan currently.  Is asking for more pain medication.  Shared decision-making about patient coming in at this time to have better pain control and tolerate p.o., patient states that she wants to try and have pain managed here and not to come in at this time.  Pt care was handed off to R. Sponseller PA-C at 330.  Complete history and physical and current plan have been communicated.  Please refer to their note for the remainder of ED care and ultimate disposition.  Awaiting second troponin, lactic acid, UA, Covid test.  Plan to discharge of patient's pain and is able to pass p.o. challenge and is tolerating p.o.  Will replete potassium orally if patient can pass p.o. challenge.  I discussed this case with my attending physician who cosigned this note including patient's presenting symptoms, physical exam, and planned diagnostics and interventions. Attending physician stated agreement with plan or made changes to plan which were implemented.   Attending physician assessed patient at bedside.  Final Clinical Impression(s) / ED Diagnoses Final diagnoses:  Gastroparesis  Chest wall pain    Rx / DC Orders ED Discharge Orders    None       Alfredia Client,  PA-C 01/23/20 1544    Davonna Belling, MD 01/28/20 0700

## 2020-01-23 NOTE — H&P (Signed)
History and Physical   Deborah [name] DXA:[id number] DOB: 09-24-1965 DOA: 01/23/2020  Referring MD/NP/PA: Dr. Eulis Foster  PCP: Cleophas Dunker, DO   Outpatient Specialists: Dr. Ardis Hughs gastroenterology  Patient coming from: Home  Chief Complaint: Nausea vomiting abdominal pain  HPI: Deborah [name] is a 54 y.o. female with medical history significant of diabetic gastroparesis, diastolic dysfunction CHF diabetes, hypertension, chronic pain syndrome among other things who came to the ER with intractable nausea vomiting abdominal pain.  Patient was initially unable to keep anything down.  She continues to have pain at 8 out of 10.  Denied any diarrhea.  No hematemesis no melena no bright red blood per rectum.  Patient was found to be in acute kidney injury.  Appears to have a flareup of her gastroparesis.  Patient also has some chest discomfort/shortness of breath.  She was found to have acute kidney injury probably prerenal.  Patient being admitted to the hospital for management of her symptoms.  She was recently admitted to the hospital with similar findings.  Patient has history of carcinoid tumor and.  She has refused CT scan today to follow-up on her symptoms.  She was found to have mildly elevated troponin and BNP today but indeterminate range.  Also white count is elevated with lactic acidosis and she meets the criteria for sepsis.  Potassium down to 2.8.  Patient has chronic pain and is asking for pain medications which has been turned down especially her Dilaudid as that makes her gastroparesis worse.  We will admit her with sepsis as this could result of some abdominal source..  ED Course: Temperature 99.3 blood pressure 192/132 pulse one sixteen respiratory rate of twenty eighteen oxygen sat 97% on room air.  Potassium is 2.8 magnesium 1.3 troponin thirty-six lactic acid initially 2.7 but up to 4.5.  Urinalysis essentially negative.  Sodium is one forty oh five glucose two  sixty-five and creatinine 1.39.  Albumin is 3.3.  Patient being admitted with sepsis as a result of possible abdominal source.  Review of Systems: As per HPI otherwise 10 point review of systems negative.    Past Medical History:  Diagnosis Date  . Acute back pain with sciatica, left   . Acute back pain with sciatica, right   . AKI (acute kidney injury) ([place])   . Anemia, unspecified   . Cancer ([place])   . Carcinoid tumor of duodenum   . Chronic pain   . Chronic systolic CHF (congestive heart failure) ([place])   . Diabetes mellitus   . Esophageal reflux   . Fibromyalgia   . Gastric ulcer   . Gastroparesis   . Gout   . Hyperlipidemia   . Hypertension   . Lumbosacral stenosis   . NICM (nonischemic cardiomyopathy) ([place])   . Obesity   . PAF (paroxysmal atrial fibrillation) ([place])   . Stroke ([place]) 02/2011  . Vitamin B12 deficiency anemia     Past Surgical History:  Procedure Laterality Date  . BIOPSY  07/27/2019   Procedure: BIOPSY;  Surgeon: Clarene Essex, MD;  Location: [facility];  Service: Endoscopy;;  . BIOPSY  07/30/2019   Procedure: BIOPSY;  Surgeon: Otis Brace, MD;  Location: [facility];  Service: Gastroenterology;;  . CATARACT EXTRACTION  01/2014  . CHOLECYSTECTOMY    . COLONOSCOPY WITH PROPOFOL N/A 07/30/2019   Procedure: COLONOSCOPY WITH PROPOFOL;  Surgeon: Otis Brace, MD;  Location: [facility];  Service: Gastroenterology;  Laterality: N/A;  . ESOPHAGOGASTRODUODENOSCOPY N/A 07/27/2019   Procedure: ESOPHAGOGASTRODUODENOSCOPY (  EGD);  Surgeon: Clarene Essex, MD;  Location: [facility];  Service: Endoscopy;  Laterality: N/A;  . ESOPHAGOGASTRODUODENOSCOPY (EGD) WITH PROPOFOL N/A 08/02/2019   Procedure: ESOPHAGOGASTRODUODENOSCOPY (EGD) WITH PROPOFOL;  Surgeon: Otis Brace, MD;  Location: [facility];  Service: Gastroenterology;  Laterality: N/A;  . HEMOSTASIS CLIP PLACEMENT  08/02/2019   Procedure: HEMOSTASIS CLIP PLACEMENT;  Surgeon: Otis Brace, MD;   Location: [facility];  Service: Gastroenterology;;  . POLYPECTOMY  07/30/2019   Procedure: POLYPECTOMY;  Surgeon: Otis Brace, MD;  Location: [facility];  Service: Gastroenterology;;  . POLYPECTOMY  08/02/2019   Procedure: POLYPECTOMY;  Surgeon: Otis Brace, MD;  Location: [facility];  Service: Gastroenterology;;     reports that she has never smoked. She has never used smokeless tobacco. She reports that she does not drink alcohol and does not use drugs.  Allergies  Allergen Reactions  . Diazepam [name]  . Isovue [Iopamidol] Anaphylaxis    11/28/17 Patient had seizure like activity and then 1 min code after 100 cc of isovue 300. Possible contrast allergy vs vasovagal episode  . Lisinopril Anaphylaxis    Tongue and mouth swelling  . Metoclopramide Other (See Comments)    Tardive dyskinesia  . Penicillins Palpitations    Has patient had a PCN reaction causing immediate rash, facial/tongue/throat swelling, SOB or lightheadedness with hypotension: Yes, heart races Has patient had a PCN reaction causing severe rash involving mucus membranes or skin necrosis: No Has patient had a PCN reaction that required hospitalization: Yes  Has patient had a PCN reaction occurring within the last 10 years: No   . Tolmetin Nausea Only and Other (See Comments)    ULCER  . Aspirin Other (See Comments)    Irritates stomach ulcer   . Nsaids Other (See Comments)    ULCER  . Acetaminophen Nausea Only and Other (See Comments)    Irritates stomach ulcer; Abdominal pain  . Tramadol Nausea And Vomiting    Family History  Problem Relation Age of Onset  . Diabetes Mother   . Diabetes Father   . Heart disease Father   . Diabetes Sister   . Congestive Heart Failure Sister 34  . Diabetes Brother      Prior to Admission medications   Medication Sig Start Date End Date Taking? Authorizing Provider  albuterol (PROVENTIL) (2.5 MG/3ML) 0.083% nebulizer solution Take 3 mLs (2.5 mg  total) by nebulization every 6 (six) hours as needed for wheezing or shortness of breath. 04/06/19   Scot Jun, FNP  allopurinol (ZYLOPRIM) 100 MG tablet Take 100 mg by mouth daily.    [provider]  ALPRAZolam Duanne Moron) 1 MG tablet Take 1 tablet (1 mg total) by mouth 2 (two) times daily as needed for anxiety. 09/03/19   Lockamy, Christia Reading, DO  atorvastatin (LIPITOR) 10 MG tablet TAKE ONE TABLET BY MOUTH ONCE DAILY (AM) Patient taking differently: Take 10 mg by mouth daily.  11/14/19   Meccariello, Bernita Raisin, DO  blood glucose meter kit and supplies KIT Dispense based on patient and insurance preference. Use up to four times daily as directed. (FOR ICD-9 250.00, 250.01). 12/13/18   [name], DO  cetirizine (ZYRTEC) 10 MG tablet TAKE ONE TABLET BY MOUTH ONCE DAILY (AM) Patient taking differently: Take 10 mg by mouth daily.  11/14/19   Meccariello, Bernita Raisin, DO  colchicine 0.6 MG tablet Take 2 tablets at 1 time (1.2 mg total) and 1 hour later take 1 tablet Patient taking differently: Take 0.6 mg  by mouth See admin instructions. Take 2 tablets (1.65m totally) by mouth at 1 time and 1 hour later take 1 tablet (0.6 mg totally) as needed for gout flares 01/04/20   CGifford Shave MD  DULoxetine HCl 40 MG CPEP Take 40 mg by mouth in the morning and at bedtime.    [provider]  EASY COMFORT PEN NEEDLES 31G X 5 MM MISC USE 3 TIMES A DAY FOR INSULIN ADMINISTRATION 11/14/19   Meccariello, BMel AlmondJ, DO  ELIQUIS 5 MG TABS tablet TAKE 1 TABLET BY MOUTH 2 (TWO) TIMES DAILY. Patient taking differently: Take 5 mg by mouth 2 (two) times daily.  12/20/19   Meccariello, BBernita Raisin DO  FARXIGA 10 MG TABS tablet Take 10 mg by mouth daily. 10/02/19   [provider]  ferrous sulfate 325 (65 FE) MG tablet Take 1 tablet (325 mg total) by mouth daily with breakfast. 09/01/19 01/08/20  EAlma Friendly MD  fluticasone (FLONASE) 50 MCG/ACT nasal spray Place 2 sprays into both nostrils daily  as needed for allergies or rhinitis. 12/19/18   Rai, Ripudeep K, MD  hydrALAZINE (APRESOLINE) 25 MG tablet TAKE 1 TABLET BY MOUTH 3 (THREE) TIMES DAILY. FOR BLOOD PRESSURE Patient taking differently: Take 25 mg by mouth 3 (three) times daily.  10/02/19   Meccariello, BBernita Raisin DO  HYDROmorphone (DILAUDID) 2 MG tablet TAKE ONE-HALF TABLETS (1 MG TOTAL) BY MOUTH DAILY AS NEEDED FOR SEVERE PAIN. Patient taking differently: Take 1 mg by mouth daily as needed for severe pain.  12/17/19   Raulkar, KClide Deutscher MD  hydrOXYzine (ATARAX/VISTARIL) 10 MG tablet Take 1 tablet (10 mg total) by mouth 3 (three) times daily as needed. Patient taking differently: Take 10 mg by mouth 3 (three) times daily as needed for itching or anxiety.  11/15/19   Meccariello, BBernita Raisin DO  insulin aspart (NOVOLOG FLEXPEN) 100 UNIT/ML FlexPen Inject 15 Units into the skin 3 (three) times daily with meals. Patient taking differently: Inject 30 Units into the skin in the morning, at noon, in the evening, and at bedtime.  06/26/19   NMariel Aloe MD  isosorbide mononitrate (IMDUR) 60 MG 24 hr tablet TAKE 1 TABLET (60 MG TOTAL) BY MOUTH DAILY. 12/21/19   Meccariello, BBernita Raisin DO  LANTUS SOLOSTAR 100 UNIT/ML Solostar Pen Inject 50 Units into the skin at bedtime. 11/15/19   Meccariello, BMel AlmondJ, DO  melatonin 3 MG TABS tablet TAKE 2 TABLETS BY MOUTH AT BEDTIME. Patient taking differently: Take 6 mg by mouth at bedtime.  12/21/19   Meccariello, BBernita Raisin DO  methocarbamol (ROBAXIN) 500 MG tablet Take 500 mg by mouth daily.     [provider]  metoprolol succinate (TOPROL-XL) 25 MG 24 hr tablet TAKE ONE TABLET BY MOUTH ONCE DAILY Patient taking differently: Take 25 mg by mouth daily.  12/21/19   Meccariello, BBernita Raisin DO  nitroGLYCERIN (NITROSTAT) 0.4 MG SL tablet Place 1 tablet (0.4 mg total) under the tongue every 5 (five) minutes as needed for chest pain. Patient taking differently: Place 0.4 mg under the tongue every 5 (five)  minutes as needed for chest pain (max 3 doses).  12/13/18   Lockamy, TChristia Reading DO  ondansetron (ZOFRAN-ODT) 4 MG disintegrating tablet Take 1 tablet (4 mg total) by mouth every 8 (eight) hours as needed for nausea or vomiting. 08/09/19   Rai, Ripudeep K, MD  pantoprazole (PROTONIX) 40 MG tablet TAKE ONE TABLET BY MOUTH TWICE DAILY Patient taking differently: Take 40 mg  by mouth 2 (two) times daily.  09/04/19   [name], DO  spironolactone (ALDACTONE) 25 MG tablet Take 12.5 mg by mouth daily. 12/24/19   [provider]  torsemide (DEMADEX) 20 MG tablet TAKE 2 TABLETS (40 MG TOTAL) BY MOUTH DAILY. 12/20/19   Meccariello, Bernita Raisin, DO    Physical Exam: Vitals:   01/23/20 1720 01/23/20 1730 01/23/20 1800 01/23/20 1830  BP: 123/69 120/67 138/78 140/79  Pulse: (!) 103 (!) 105 (!) 102 (!) 107  Resp:  (!) 23 (!) 23 18  Temp:      TempSrc:      SpO2: 100% 97% 98% 100%      Constitutional: NAD, calm, comfortable Vitals:   01/23/20 1720 01/23/20 1730 01/23/20 1800 01/23/20 1830  BP: 123/69 120/67 138/78 140/79  Pulse: (!) 103 (!) 105 (!) 102 (!) 107  Resp:  (!) 23 (!) 23 18  Temp:      TempSrc:      SpO2: 100% 97% 98% 100%   Eyes: PERRL, lids and conjunctivae normal ENMT: Mucous membranes are dry. Posterior pharynx clear of any exudate or lesions.Normal dentition.  Neck: normal, supple, no masses, no thyromegaly Respiratory: clear to auscultation bilaterally, no wheezing, no crackles. Normal respiratory effort. No accessory muscle use.  Cardiovascular: Sinus tachycardia no murmurs / rubs / gallops. No extremity edema. 2+ pedal pulses. No carotid bruits.  Abdomen: Diffuse abdominal tenderness, no masses palpated. No hepatosplenomegaly. Bowel sounds positive.  Musculoskeletal: no clubbing / cyanosis. No joint deformity upper and lower extremities. Good ROM, no contractures. Normal muscle tone.  Skin: no rashes, lesions, ulcers. No induration Neurologic: CN 2-12 grossly intact.  Sensation intact, DTR normal. Strength 5/5 in all 4.  Psychiatric: Normal judgment and insight. Alert and oriented x 3. Normal mood.     Labs on Admission: I have personally reviewed following labs and imaging studies  CBC: Recent Labs  Lab 01/23/20 1315  WBC 15.2*  HGB 10.3*  HCT 32.4*  MCV 87.1  PLT 732*   Basic Metabolic Panel: Recent Labs  Lab 01/23/20 1400 01/23/20 1558  NA 141  --   K 2.8*  --   CL 105  --   CO2 19*  --   GLUCOSE 265*  --   BUN 19  --   CREATININE 1.39*  --   CALCIUM 8.7*  --   MG  --  1.3*   GFR: Estimated Creatinine Clearance: 69.5 mL/min (A) (by C-G formula based on SCr of 1.39 mg/dL (H)). Liver Function Tests: Recent Labs  Lab 01/23/20 1400  AST 21  ALT 19  ALKPHOS 94  BILITOT 0.7  PROT 7.5  ALBUMIN 3.3*   Recent Labs  Lab 01/23/20 1400  LIPASE 32   No results for input(s): AMMONIA in the last 168 hours. Coagulation Profile: No results for input(s): INR, PROTIME in the last 168 hours. Cardiac Enzymes: No results for input(s): CKTOTAL, CKMB, CKMBINDEX, TROPONINI in the last 168 hours. BNP (last 3 results) No results for input(s): PROBNP in the last 8760 hours. HbA1C: No results for input(s): HGBA1C in the last 72 hours. CBG: No results for input(s): GLUCAP in the last 168 hours. Lipid Profile: No results for input(s): CHOL, HDL, LDLCALC, TRIG, CHOLHDL, LDLDIRECT in the last 72 hours. Thyroid Function Tests: No results for input(s): TSH, T4TOTAL, FREET4, T3FREE, THYROIDAB in the last 72 hours. Anemia Panel: No results for input(s): VITAMINB12, FOLATE, FERRITIN, TIBC, IRON, RETICCTPCT in the last 72 hours. Urine analysis:  Component Value Date/Time   COLORURINE STRAW (A) 01/09/2020 0420   APPEARANCEUR CLEAR 01/09/2020 0420   LABSPEC 1.005 01/09/2020 0420   PHURINE 5.0 01/09/2020 0420   GLUCOSEU >=500 (A) 01/09/2020 0420   HGBUR SMALL (A) 01/09/2020 0420   BILIRUBINUR NEGATIVE 01/09/2020 0420   KETONESUR NEGATIVE  01/09/2020 0420   PROTEINUR 100 (A) 01/09/2020 0420   UROBILINOGEN 0.2 10/02/2013 2108   NITRITE NEGATIVE 01/09/2020 0420   LEUKOCYTESUR NEGATIVE 01/09/2020 0420   Sepsis Labs: '@LABRCNTIP' (procalcitonin:4,lacticidven:4) ) Recent Results (from the past 240 hour(s))  Respiratory Panel by RT PCR (Flu A&B, Covid) - Nasopharyngeal Swab     Status: None   Collection Time: 01/23/20  1:35 PM   Specimen: Nasopharyngeal Swab  Result Value Ref Range Status   SARS Coronavirus 2 by RT PCR NEGATIVE NEGATIVE Final    Comment: (NOTE) SARS-CoV-2 target nucleic acids are NOT DETECTED.  The SARS-CoV-2 RNA is generally detectable in upper respiratoy specimens during the acute phase of infection. The lowest concentration of SARS-CoV-2 viral copies this assay can detect is 131 copies/mL. A negative result does not preclude SARS-Cov-2 infection and should not be used as the sole basis for treatment or other patient management decisions. A negative result may occur with  improper specimen collection/handling, submission of specimen other than nasopharyngeal swab, presence of viral mutation(s) within the areas targeted by this assay, and inadequate number of viral copies (<131 copies/mL). A negative result must be combined with clinical observations, patient history, and epidemiological information. The expected result is Negative.  Fact Sheet for Patients:  PinkCheek.be  Fact Sheet for Healthcare Providers:  GravelBags.it  This test is no t yet approved or cleared by the [place] FDA and  has been authorized for detection and/or diagnosis of SARS-CoV-2 by FDA under an Emergency Use Authorization (EUA). This EUA will remain  in effect (meaning this test can be used) for the duration of the COVID-19 declaration under Section 564(b)(1) of the Act, 21 U.S.C. section 360bbb-3(b)(1), unless the authorization is terminated or revoked  sooner.     Influenza A by PCR NEGATIVE NEGATIVE Final   Influenza B by PCR NEGATIVE NEGATIVE Final    Comment: (NOTE) The Xpert Xpress SARS-CoV-2/FLU/RSV assay is intended as an aid in  the diagnosis of influenza from Nasopharyngeal swab specimens and  should not be used as a sole basis for treatment. Nasal washings and  aspirates are unacceptable for Xpert Xpress SARS-CoV-2/FLU/RSV  testing.  Fact Sheet for Patients: PinkCheek.be  Fact Sheet for Healthcare Providers: GravelBags.it  This test is not yet approved or cleared by the [place] FDA and  has been authorized for detection and/or diagnosis of SARS-CoV-2 by  FDA under an Emergency Use Authorization (EUA). This EUA will remain  in effect (meaning this test can be used) for the duration of the  Covid-19 declaration under Section 564(b)(1) of the Act, 21  U.S.C. section 360bbb-3(b)(1), unless the authorization is  terminated or revoked. Performed at [facility], [place] [address] [place], [place] [address]      Radiological Exams on Admission: DG Chest Port 1 View  Result Date: 01/23/2020 CLINICAL DATA:  [name].  Chest pain. EXAM: PORTABLE CHEST 1 VIEW COMPARISON:  January 11, 2020. FINDINGS: The heart size and mediastinal contours are within normal limits. No consolidation. No visible pleural effusions or pneumothorax. No acute osseous abnormality. IMPRESSION: No acute cardiopulmonary disease. Electronically Signed   By: Margaretha Sheffield MD   On: 01/23/2020 13:41  EKG: Independently reviewed.  No significant changes on EKG  Assessment/Plan Principal Problem:   Sepsis ([place]) Active Problems:   Essential hypertension, benign   Type 2 diabetes mellitus with hyperglycemia, with long-term current use of insulin (HCC)   Hypokalemia   Hypomagnesemia   GERD (gastroesophageal reflux disease)   Morbid obesity (HCC)   Intractable  nausea and vomiting   Chronic systolic CHF (congestive heart failure) ([facility])   Malignant carcinoid tumor of duodenum (HCC)   Chronic pain   AKI (acute kidney injury) ([place])     #1 sepsis due to abdominal source most likely: Patient has refused CT abdomen pelvis does have an abdominal pain.  No other source of sepsis.  She could also have just lactic acidosis.  We will admit the patient and treat her with IV antibiotics.  Fluid resuscitation.  On determinate abdominal source will be the focus of the sepsis treatment.  #2 hypokalemia: Significantly low potassium.  2.8 probably from nausea vomiting as well as hypomagnesemia.  Will replete potassium.  #3 hypomagnesemia: Continue to replete magnesium.  Initiated in the ER IV.  #4 AKI: Mild worsening renal function probably secondary to prerenal causes.  Hydrate and monitor.  #5 gastroparesis: Chronic.  Symptomatic management.  Follow GI recommendations  #6 hypertension: Resume home regimen when capable of taking p.o.'s  #7 morbid obesity: Dietary counseling.  #8 chronic pain syndrome: We will avoid narcotics secondary to gastroparesis getting worse.  #9 insulin-dependent diabetes: Continue with sliding scale insulin and home regimen.   DVT prophylaxis: Eliquis Code Status: Full code Family Communication: No family at bedside Disposition Plan: Home Consults called: None but may call GI in the morning if persistent Admission status: Inpatient  Severity of Illness: The appropriate patient status for this patient is INPATIENT. Inpatient status is judged to be reasonable and necessary in order to provide the required intensity of service to ensure the patient's safety. The patient's presenting symptoms, physical exam findings, and initial radiographic and laboratory data in the context of their chronic comorbidities is felt to place them at high risk for further clinical deterioration. Furthermore, it is not anticipated that the patient will  be medically stable for discharge from the hospital within 2 midnights of admission. The following factors support the patient status of inpatient.   " The patient's presenting symptoms include intractable nausea vomiting. " The worrisome physical exam findings include dry mucous membranes. " The initial radiographic and laboratory data are worrisome because of hypokalemia. " The chronic co-morbidities include diabetes with gastroparesis.   * I certify that at the point of admission it is my clinical judgment that the patient will require inpatient hospital care spanning beyond 2 midnights from the point of admission due to high intensity of service, high risk for further deterioration and high frequency of surveillance required.Barbette Merino MD [facility] Pager [phone]  If 7PM-7AM, please contact night-coverage [email or web address] Password [facility]  01/23/2020, 6:43 PM

## 2020-01-24 ENCOUNTER — Other Ambulatory Visit: Payer: Self-pay

## 2020-01-24 DIAGNOSIS — N179 Acute kidney failure, unspecified: Secondary | ICD-10-CM

## 2020-01-24 DIAGNOSIS — I10 Essential (primary) hypertension: Secondary | ICD-10-CM

## 2020-01-24 DIAGNOSIS — I5022 Chronic systolic (congestive) heart failure: Secondary | ICD-10-CM | POA: Diagnosis not present

## 2020-01-24 DIAGNOSIS — E872 Acidosis: Secondary | ICD-10-CM

## 2020-01-24 DIAGNOSIS — E876 Hypokalemia: Secondary | ICD-10-CM

## 2020-01-24 DIAGNOSIS — E860 Dehydration: Principal | ICD-10-CM

## 2020-01-24 DIAGNOSIS — E1165 Type 2 diabetes mellitus with hyperglycemia: Secondary | ICD-10-CM

## 2020-01-24 DIAGNOSIS — G8929 Other chronic pain: Secondary | ICD-10-CM | POA: Diagnosis not present

## 2020-01-24 DIAGNOSIS — Z794 Long term (current) use of insulin: Secondary | ICD-10-CM

## 2020-01-24 DIAGNOSIS — R112 Nausea with vomiting, unspecified: Secondary | ICD-10-CM

## 2020-01-24 DIAGNOSIS — C7A010 Malignant carcinoid tumor of the duodenum: Secondary | ICD-10-CM

## 2020-01-24 DIAGNOSIS — K219 Gastro-esophageal reflux disease without esophagitis: Secondary | ICD-10-CM

## 2020-01-24 LAB — COMPREHENSIVE METABOLIC PANEL
ALT: 15 U/L (ref 0–44)
AST: 14 U/L — ABNORMAL LOW (ref 15–41)
Albumin: 2.9 g/dL — ABNORMAL LOW (ref 3.5–5.0)
Alkaline Phosphatase: 81 U/L (ref 38–126)
Anion gap: 12 (ref 5–15)
BUN: 14 mg/dL (ref 6–20)
CO2: 20 mmol/L — ABNORMAL LOW (ref 22–32)
Calcium: 8.1 mg/dL — ABNORMAL LOW (ref 8.9–10.3)
Chloride: 105 mmol/L (ref 98–111)
Creatinine, Ser: 1.35 mg/dL — ABNORMAL HIGH (ref 0.44–1.00)
GFR, Estimated: 46 mL/min — ABNORMAL LOW (ref >60)
Glucose, Bld: 176 mg/dL — ABNORMAL HIGH (ref 70–99)
Potassium: 2.6 mmol/L — CL (ref 3.5–5.1)
Sodium: 137 mmol/L (ref 135–145)
Total Bilirubin: 0.2 mg/dL — ABNORMAL LOW (ref 0.3–1.2)
Total Protein: 6.5 g/dL (ref 6.5–8.1)

## 2020-01-24 LAB — URINALYSIS, ROUTINE W REFLEX MICROSCOPIC
Bilirubin Urine: NEGATIVE
Glucose, UA: >=500 mg/dL — AB
Hgb urine dipstick: NEGATIVE
Ketones, ur: NEGATIVE mg/dL
Leukocytes,Ua: NEGATIVE
Nitrite: NEGATIVE
Protein, ur: >=300 mg/dL — AB
Specific Gravity, Urine: 1.008 (ref 1.005–1.030)
pH: 5 (ref 5.0–8.0)

## 2020-01-24 LAB — GLUCOSE, CAPILLARY
Glucose-Capillary: 171 mg/dL — ABNORMAL HIGH (ref 70–99)
Glucose-Capillary: 187 mg/dL — ABNORMAL HIGH (ref 70–99)
Glucose-Capillary: 233 mg/dL — ABNORMAL HIGH (ref 70–99)
Glucose-Capillary: 253 mg/dL — ABNORMAL HIGH (ref 70–99)

## 2020-01-24 LAB — CBC
HCT: 29.2 % — ABNORMAL LOW (ref 36.0–46.0)
Hemoglobin: 8.8 g/dL — ABNORMAL LOW (ref 12.0–15.0)
MCH: 27.5 pg (ref 26.0–34.0)
MCHC: 30.1 g/dL (ref 30.0–36.0)
MCV: 91.3 fL (ref 80.0–100.0)
Platelets: 405 10*3/uL — ABNORMAL HIGH (ref 150–400)
RBC: 3.2 MIL/uL — ABNORMAL LOW (ref 3.87–5.11)
RDW: 15.9 % — ABNORMAL HIGH (ref 11.5–15.5)
WBC: 12.9 10*3/uL — ABNORMAL HIGH (ref 4.0–10.5)
nRBC: 0 % (ref 0.0–0.2)

## 2020-01-24 LAB — MAGNESIUM: Magnesium: 1.5 mg/dL — ABNORMAL LOW (ref 1.7–2.4)

## 2020-01-24 LAB — PROTIME-INR
INR: 1.1 (ref 0.8–1.2)
Prothrombin Time: 13.6 seconds (ref 11.4–15.2)

## 2020-01-24 LAB — PROCALCITONIN: Procalcitonin: <0.1 ng/mL

## 2020-01-24 LAB — CORTISOL-AM, BLOOD: Cortisol - AM: 22.3 ug/dL (ref 6.7–22.6)

## 2020-01-24 LAB — LACTIC ACID, PLASMA: Lactic Acid, Venous: 1.9 mmol/L (ref 0.5–1.9)

## 2020-01-24 MED ORDER — HYDROMORPHONE HCL 1 MG/ML IJ SOLN
1.0000 mg | INTRAMUSCULAR | Status: DC | PRN
  Administered 2020-01-24 (×2): 1 mg via INTRAVENOUS
  Filled 2020-01-24 (×2): qty 1

## 2020-01-24 MED ORDER — LACTATED RINGERS IV SOLN: INTRAVENOUS | Status: DC

## 2020-01-24 MED ORDER — HYDROMORPHONE HCL 2 MG PO TABS
1.0000 mg | ORAL_TABLET | Freq: Every day | ORAL | Status: DC | PRN
  Administered 2020-01-24: 1 mg via ORAL
  Filled 2020-01-24: qty 1

## 2020-01-24 MED ORDER — HYDROXYZINE HCL 10 MG PO TABS
10.0000 mg | ORAL_TABLET | Freq: Three times a day (TID) | ORAL | Status: DC | PRN
  Administered 2020-01-24 – 2020-01-26 (×2): 10 mg via ORAL
  Filled 2020-01-24 (×3): qty 1

## 2020-01-24 MED ORDER — INSULIN ASPART 100 UNIT/ML ~~LOC~~ SOLN
0.0000 [IU] | Freq: Three times a day (TID) | SUBCUTANEOUS | Status: DC
  Administered 2020-01-24: 3 [IU] via SUBCUTANEOUS
  Administered 2020-01-24 – 2020-01-25 (×2): 8 [IU] via SUBCUTANEOUS
  Administered 2020-01-25: 11 [IU] via SUBCUTANEOUS
  Administered 2020-01-25: 8 [IU] via SUBCUTANEOUS
  Administered 2020-01-26: 5 [IU] via SUBCUTANEOUS
  Administered 2020-01-26 (×2): 11 [IU] via SUBCUTANEOUS
  Administered 2020-01-27: 8 [IU] via SUBCUTANEOUS
  Administered 2020-01-27: 5 [IU] via SUBCUTANEOUS
  Administered 2020-01-27: 8 [IU] via SUBCUTANEOUS

## 2020-01-24 MED ORDER — HYDRALAZINE HCL 25 MG PO TABS
25.0000 mg | ORAL_TABLET | Freq: Three times a day (TID) | ORAL | Status: DC
  Administered 2020-01-24 – 2020-01-27 (×11): 25 mg via ORAL
  Filled 2020-01-24 (×12): qty 1

## 2020-01-24 MED ORDER — SODIUM CHLORIDE 0.9 % IV SOLN: 2.0000 g | Freq: Once | INTRAVENOUS | Status: DC

## 2020-01-24 MED ORDER — POTASSIUM CHLORIDE CRYS ER 20 MEQ PO TBCR
30.0000 meq | EXTENDED_RELEASE_TABLET | Freq: Two times a day (BID) | ORAL | Status: AC
  Administered 2020-01-24 – 2020-01-25 (×4): 30 meq via ORAL
  Filled 2020-01-24 (×5): qty 1

## 2020-01-24 MED ORDER — SODIUM CHLORIDE 0.9 % IV SOLN
500.0000 mg | Freq: Three times a day (TID) | INTRAVENOUS | Status: DC
  Administered 2020-01-24: 500 mg via INTRAVENOUS
  Filled 2020-01-24: qty 500
  Filled 2020-01-24 (×2): qty 0.5

## 2020-01-24 MED ORDER — ONDANSETRON HCL 4 MG PO TABS
4.0000 mg | ORAL_TABLET | Freq: Four times a day (QID) | ORAL | Status: DC | PRN
  Filled 2020-01-24: qty 1

## 2020-01-24 MED ORDER — APIXABAN 5 MG PO TABS
5.0000 mg | ORAL_TABLET | Freq: Two times a day (BID) | ORAL | Status: DC
  Administered 2020-01-24 – 2020-01-27 (×8): 5 mg via ORAL
  Filled 2020-01-24 (×9): qty 1

## 2020-01-24 MED ORDER — ALPRAZOLAM 1 MG PO TABS
1.0000 mg | ORAL_TABLET | Freq: Two times a day (BID) | ORAL | Status: DC | PRN
  Administered 2020-01-24: 1 mg via ORAL
  Filled 2020-01-24 (×2): qty 1

## 2020-01-24 MED ORDER — ISOSORBIDE MONONITRATE ER 60 MG PO TB24
60.0000 mg | ORAL_TABLET | Freq: Every day | ORAL | Status: DC
  Administered 2020-01-24 – 2020-01-27 (×4): 60 mg via ORAL
  Filled 2020-01-24 (×5): qty 1

## 2020-01-24 MED ORDER — POTASSIUM CHLORIDE 10 MEQ/100ML IV SOLN
10.0000 meq | INTRAVENOUS | Status: AC
  Administered 2020-01-24: 10 meq via INTRAVENOUS
  Filled 2020-01-24: qty 100

## 2020-01-24 MED ORDER — HYDROMORPHONE HCL 1 MG/ML IJ SOLN
1.0000 mg | Freq: Four times a day (QID) | INTRAMUSCULAR | Status: DC | PRN
  Administered 2020-01-24 – 2020-01-25 (×4): 1 mg via INTRAVENOUS
  Filled 2020-01-24 (×4): qty 1

## 2020-01-24 MED ORDER — INSULIN ASPART 100 UNIT/ML ~~LOC~~ SOLN
0.0000 [IU] | Freq: Every day | SUBCUTANEOUS | Status: DC
  Administered 2020-01-24: 2 [IU] via SUBCUTANEOUS
  Administered 2020-01-25 – 2020-01-27 (×3): 4 [IU] via SUBCUTANEOUS

## 2020-01-24 MED ORDER — KETOROLAC TROMETHAMINE 30 MG/ML IJ SOLN
30.0000 mg | Freq: Once | INTRAMUSCULAR | Status: AC
  Administered 2020-01-24: 30 mg via INTRAVENOUS
  Filled 2020-01-24: qty 1

## 2020-01-24 MED ORDER — HEPARIN SODIUM (PORCINE) 5000 UNIT/ML IJ SOLN
5000.0000 [IU] | Freq: Three times a day (TID) | INTRAMUSCULAR | Status: DC
  Filled 2020-01-24: qty 1

## 2020-01-24 MED ORDER — MAGNESIUM SULFATE 2 GM/50ML IV SOLN
2.0000 g | Freq: Once | INTRAVENOUS | Status: AC
  Administered 2020-01-24: 2 g via INTRAVENOUS
  Filled 2020-01-24: qty 50

## 2020-01-24 MED ORDER — HYDROMORPHONE HCL 1 MG/ML PO LIQD: 2.0000 mg | Freq: Four times a day (QID) | ORAL | Status: DC | PRN

## 2020-01-24 MED ORDER — ONDANSETRON HCL 4 MG/2ML IJ SOLN
4.0000 mg | Freq: Four times a day (QID) | INTRAMUSCULAR | Status: DC | PRN
  Administered 2020-01-24 – 2020-01-27 (×5): 4 mg via INTRAVENOUS
  Filled 2020-01-24 (×6): qty 2

## 2020-01-24 MED ORDER — POTASSIUM CHLORIDE 10 MEQ/100ML IV SOLN
10.0000 meq | INTRAVENOUS | Status: AC
  Administered 2020-01-24 (×3): 10 meq via INTRAVENOUS
  Filled 2020-01-24 (×3): qty 100

## 2020-01-24 MED ORDER — HYDROMORPHONE HCL 2 MG PO TABS
2.0000 mg | ORAL_TABLET | Freq: Four times a day (QID) | ORAL | Status: DC | PRN
  Administered 2020-01-25 – 2020-01-27 (×6): 2 mg via ORAL
  Filled 2020-01-24 (×7): qty 1

## 2020-01-24 MED ORDER — NITROGLYCERIN 0.4 MG SL SUBL
0.4000 mg | SUBLINGUAL_TABLET | SUBLINGUAL | Status: DC | PRN
  Administered 2020-01-24: 0.4 mg via SUBLINGUAL
  Filled 2020-01-24: qty 1

## 2020-01-24 MED ORDER — OXYCODONE HCL 5 MG PO TABS
5.0000 mg | ORAL_TABLET | Freq: Once | ORAL | Status: DC
  Filled 2020-01-24: qty 1

## 2020-01-24 NOTE — Sepsis Progress Note (Signed)
Sepsis Bundle Monitoring is complete.  Rising lactic acid with known colon CA. Received full volume resuscitation with normal BP. Difficult stick. Antibiotics received.

## 2020-01-24 NOTE — Plan of Care (Signed)

## 2020-01-24 NOTE — Progress Notes (Signed)
On-call attending M/ Sharlet Salina notified that patient is having pain 10/10 in her lower abdomen, no prn to administer. Will continue to monitor the patient.

## 2020-01-24 NOTE — Progress Notes (Signed)
PROGRESS NOTE    Deborah [name]  BJY:[id number] DOB: 11/21/1965 DOA: 01/23/2020 PCP: Cleophas Dunker, DO  Brief Narrative:  HPI per Dr. Gala Romney on 01/23/20 Deborah [name] is a 54 y.o. female with medical history significant of diabetic gastroparesis, diastolic dysfunction CHF diabetes, hypertension, chronic pain syndrome among other things who came to the ER with intractable nausea vomiting abdominal pain.  Patient was initially unable to keep anything down.  She continues to have pain at 8 out of 10.  Denied any diarrhea.  No hematemesis no melena no bright red blood per rectum.  Patient was found to be in acute kidney injury.  Appears to have a flareup of her gastroparesis.  Patient also has some chest discomfort/shortness of breath.  She was found to have acute kidney injury probably prerenal.  Patient being admitted to the hospital for management of her symptoms.  She was recently admitted to the hospital with similar findings.  Patient has history of carcinoid tumor and.  She has refused CT scan today to follow-up on her symptoms.  She was found to have mildly elevated troponin and BNP today but indeterminate range.  Also white count is elevated with lactic acidosis and she meets the criteria for sepsis.  Potassium down to 2.8.  Patient has chronic pain and is asking for pain medications which has been turned down especially her Dilaudid as that makes her gastroparesis worse.  We will admit her with sepsis as this could result of some abdominal source..  ED Course: Temperature 99.3 blood pressure 192/132 pulse one sixteen respiratory rate of twenty eighteen oxygen sat 97% on room air.  Potassium is 2.8 magnesium 1.3 troponin thirty-six lactic acid initially 2.7 but up to 4.5.  Urinalysis essentially negative.  Sodium is one forty oh five glucose two sixty-five and creatinine 1.39.  Albumin is 3.3.  Patient being admitted with sepsis as a result of possible abdominal  source.  **Interim History She continues to have significant abdominal pain and was having some nausea vomiting and states that she had "a little diarrhea.  Fixated on getting pain relief with IV Dilaudid.   Assessment & Plan:   Principal Problem:   Sepsis ([place]) Active Problems:   Essential hypertension, benign   Type 2 diabetes mellitus with hyperglycemia, with long-term current use of insulin (HCC)   Hypokalemia   Hypomagnesemia   GERD (gastroesophageal reflux disease)   Morbid obesity (HCC)   Intractable nausea and vomiting   Chronic systolic CHF (congestive heart failure) (HCC)   Malignant carcinoid tumor of duodenum (HCC)   Chronic pain   AKI (acute kidney injury) ([place])  Sepsis due to abdominal source most likely, ruled out, - likley SIRS from Dehydration -Patient had refused CT abdomen pelvis does have an abdominal pain that could be from chronic gastroparesis.   -No source of sepsis clearly identified and PCT is <0.10 and LA trended down.   -She could also have just lactic acidosis and it peaked to 4.5.   -Admitted the patient and treat her with IV antibiotics but will watch her off of Abx.   -Given IV Fluid Resuscitation.   -Urinalysis was unremarkable but did show greater than 500 glucose, rare bacteria, 0-5 squamous epithelial cells, 0-5 RBCs per high-power field, negative leukocytes, negative nitrites, and 0-5 WBC -Urine culture still pending -WBC on admission was 15.2 and could be in setting of dehydration and has improved to 12.9 -Blood cultures also pending -Given sepsis fluid boluses of 4 L  of LR and then placed on LR at 100 MLS per hour -Continue to monitor for further sources of infection as currently none have been identified  Hypokalemia -Significantly low potassium on admission at 2.8 probably from nausea vomiting as well as hypomagnesemia.  -Replete with IV KCl as well as p.o. KCl 30 mEq twice daily x2 doses for 2 days -Continue to monitor and treat as  necessary Repeat CMP in a.m.  Hypomagnesemia -Mag level was 1.3 on admission and repeat this morning is 1.5 -Replete with IV mag sulfate 2 g x 2 -Continue monitor and replete as necessary -Repeat mag level in a.m.  AKI Metabolic Acidosis -Mild worsening renal function probably secondary to prerenal causes.   -BUN/creatinine went from 19/1.39 is now 14/1.35 -She had a mild metabolic acidosis with a CO2 of 20, anion gap of 12, chloride level of 105 -Avoid further nephrotoxic medications, contrast dyes, hypotension and continue IV fluid hydration with LR at 100 MLS per hour for now and reduce to 75 mL for 12 more hours -Continue to monitor and trend and repeat CMP in a.m.  Diabetic Gastroparesis associated with abdominal pain  -Chronic.  Symptomatic management.   -Continue with supportive care and antiemetics -Follow GI recommendations -Judicious use of narcotics and patient was unconsolable and pain so have reordered IV Dilaudid short-term and also resumed her p.o. Dilaudid -We will not escalate narcotics and will de-escalate in the next day or so once her abdominal pain is better controlled; patient lost her IV access today and refused to take p.o. Dilaudid and will not give her any IM Dilaudid.  Hypertension -Resume home regimen when capable of taking p.o.'s -Sugars 159/83 -continue monitor and trend blood pressures per protocol  Chronic pain syndrome -We will avoid narcotics as much as we can secondary to gastroparesis getting worse. -Judicious use given that she may have some pain seeking component and will not escalate regimen  Insulin-dependent Diabetes Mellitus Type 2 -Continue with sliding scale insulin and home regimen -CBGs have been ranging from 187-253 -We will try and ensure keeping blood sugars under 200. -Last HbA1c was 8.2  Normocytic Anemia -Likely has some iron deficiency component and acute drop is from dilution given that she may have been  hemoconcentrated on admission -Patient's hemoglobin/hematocrit went from 10.3/32.4 is now 8.8/29.2 next-check anemia panel in a.m.  -Continue to monitor for signs and symptoms of bleeding; currently no overt bleeding noted  -Repeat CBC in a.m.  Proximal atrial fibrillation -Continue with empiric anticoagulation with apixaban  Acquired thrombophilia  -in the setting of A. fib -We will continue with anticoagulation with apixaban 5 mg p.o. twice daily  Super Morbid Obesity -Complicates overall prognosis and care -Estimated body mass index is 55.23 kg/m as calculated from the following:   Height as of this encounter: 5\' 6"  (1.676 m).   Weight as of this encounter: 155.2 kg. -Weight Loss and Dietary Counseling given   DVT prophylaxis: Anticoagulated with apixaban Code Status: FULL CODE  Family Communication: Discussed with Significant other at bedside  Disposition Plan: Pending further clinical improvement of her abdominal pain and correction of electrolytes and tolerance of diet.  Status is: Inpatient  Remains inpatient appropriate because:Unsafe d/c plan, IV treatments appropriate due to intensity of illness or inability to take PO and Inpatient level of care appropriate due to severity of illness   Dispo: The patient is from: Home              Anticipated d/c is to: Home  Anticipated d/c date is: 2 days              Patient currently is not medically stable to d/c.  Consultants:   None   Procedures: None  Antimicrobials:  Anti-infectives (From admission, onward)   Start     Dose/Rate Route Frequency Ordered Stop   01/24/20 0200  meropenem (MERREM) 500 mg in sodium chloride 0.9 % 100 mL IVPB  Status:  Discontinued        500 mg 200 mL/hr over 30 Minutes Intravenous Every 8 hours 01/24/20 0016 01/24/20 1400   01/24/20 0115  ceFEPIme (MAXIPIME) 2 g in sodium chloride 0.9 % 100 mL IVPB  Status:  Discontinued        2 g 200 mL/hr over 30 Minutes Intravenous   Once 01/24/20 0016 01/24/20 0021   01/24/20 0000  ceFEPIme (MAXIPIME) 2 g in sodium chloride 0.9 % 100 mL IVPB  Status:  Discontinued        2 g 200 mL/hr over 30 Minutes Intravenous Every 8 hours 01/23/20 1900 01/24/20 1400   01/23/20 2200  metroNIDAZOLE (FLAGYL) tablet 500 mg  Status:  Discontinued        500 mg Oral Every 8 hours 01/23/20 1748 01/24/20 1400   01/23/20 2000  ceFEPIme (MAXIPIME) 2 g in sodium chloride 0.9 % 100 mL IVPB  Status:  Discontinued        2 g 200 mL/hr over 30 Minutes Intravenous Every 8 hours 01/23/20 1900 01/23/20 1900   01/23/20 1800  cefTRIAXone (ROCEPHIN) 2 g in sodium chloride 0.9 % 100 mL IVPB        2 g 200 mL/hr over 30 Minutes Intravenous  Once 01/23/20 1748 01/23/20 2202        Subjective: Seen and examined at bedside and she was complaining of significant abdominal pain and in tears.  Was fixated on obtaining some relief with specifically "IV Dilaudid".  Denies any chest pain or shortness of breath but complained of significant abdominal pain and wanted to be left alone.  Had her IV removed and had a difficult time getting it in and refused p.o. Dilaudid even though she is tolerating some of her food.  No other concerns or complaints at this time.  Objective: Vitals:   01/24/20 0433 01/24/20 0725 01/24/20 0737 01/24/20 0813  BP: (!) 159/103 (!) 177/116 (!) 185/90 (!) 159/83  Pulse: (!) 102 (!) 115 (!) 118 (!) 108  Resp: 20   18  Temp: 97.6 F (36.4 C)   98.4 F (36.9 C)  TempSrc: Oral     SpO2: 100%   100%  Weight:      Height:        Intake/Output Summary (Last 24 hours) at 01/24/2020 1733 Last data filed at 01/24/2020 1714 Gross per 24 hour  Intake 360 ml  Output --  Net 360 ml   Filed Weights   01/24/20 0300  Weight: (!) 155.2 kg   Examination: Physical Exam:  Constitutional: WN/WD super morbidly obese African-American female currently in some distress and appears tearful and uncomfortable Eyes: Lids and conjunctivae normal,  sclerae anicteric  ENMT: External Ears, Nose appear normal. Grossly normal hearing.  Neck: Appears normal, supple, no cervical masses, normal ROM, no appreciable thyromegaly; no JVD Respiratory: Diminished to auscultation bilaterally, no wheezing, rales, rhonchi or crackles. Normal respiratory effort and patient is not tachypenic. No accessory muscle use.  Unlabored breathing Cardiovascular: RRR, no murmurs / rubs / gallops. S1 and S2 auscultated.  Minimal appreciable lower extremity edema Abdomen: Soft, tender to palpate, distended secondary body habitus.  Bowel sounds positive.  GU: Deferred. Musculoskeletal: No clubbing / cyanosis of digits/nails. No joint deformity upper and lower extremities.   Skin: No rashes, lesions, ulcers on limited skin evaluation. No induration; Warm and dry.  Neurologic: CN 2-12 grossly intact with no focal deficits. Romberg sign and cerebellar reflexes not assessed.  Psychiatric: Normal judgment and insight. Alert and oriented x 3.  Extremely anxious mood and appropriate affect.   Data Reviewed: I have personally reviewed following labs and imaging studies  CBC: Recent Labs  Lab 01/23/20 1315 01/24/20 0436  WBC 15.2* 12.9*  HGB 10.3* 8.8*  HCT 32.4* 29.2*  MCV 87.1 91.3  PLT 521* 841*   Basic Metabolic Panel: Recent Labs  Lab 01/23/20 1400 01/23/20 1558 01/24/20 0436  NA 141  --  137  K 2.8*  --  2.6*  CL 105  --  105  CO2 19*  --  20*  GLUCOSE 265*  --  176*  BUN 19  --  14  CREATININE 1.39*  --  1.35*  CALCIUM 8.7*  --  8.1*  MG  --  1.3* 1.5*   GFR: Estimated Creatinine Clearance: 72.6 mL/min (A) (by C-G formula based on SCr of 1.35 mg/dL (H)). Liver Function Tests: Recent Labs  Lab 01/23/20 1400 01/24/20 0436  AST 21 14*  ALT 19 15  ALKPHOS 94 81  BILITOT 0.7 0.2*  PROT 7.5 6.5  ALBUMIN 3.3* 2.9*   Recent Labs  Lab 01/23/20 1400  LIPASE 32   No results for input(s): AMMONIA in the last 168 hours. Coagulation  Profile: Recent Labs  Lab 01/24/20 0436  INR 1.1   Cardiac Enzymes: No results for input(s): CKTOTAL, CKMB, CKMBINDEX, TROPONINI in the last 168 hours. BNP (last 3 results) No results for input(s): PROBNP in the last 8760 hours. HbA1C: No results for input(s): HGBA1C in the last 72 hours. CBG: Recent Labs  Lab 01/24/20 0037 01/24/20 0815 01/24/20 1613  GLUCAP 233* 253* 187*   Lipid Profile: No results for input(s): CHOL, HDL, LDLCALC, TRIG, CHOLHDL, LDLDIRECT in the last 72 hours. Thyroid Function Tests: No results for input(s): TSH, T4TOTAL, FREET4, T3FREE, THYROIDAB in the last 72 hours. Anemia Panel: No results for input(s): VITAMINB12, FOLATE, FERRITIN, TIBC, IRON, RETICCTPCT in the last 72 hours. Sepsis Labs: Recent Labs  Lab 01/23/20 1515 01/23/20 1652 01/24/20 0436 01/24/20 0924  PROCALCITON  --   --  <0.10  --   LATICACIDVEN 2.7* 4.5*  --  1.9    Recent Results (from the past 240 hour(s))  Respiratory Panel by RT PCR (Flu A&B, Covid) - Nasopharyngeal Swab     Status: None   Collection Time: 01/23/20  1:35 PM   Specimen: Nasopharyngeal Swab  Result Value Ref Range Status   SARS Coronavirus 2 by RT PCR NEGATIVE NEGATIVE Final    Comment: (NOTE) SARS-CoV-2 target nucleic acids are NOT DETECTED.  The SARS-CoV-2 RNA is generally detectable in upper respiratoy specimens during the acute phase of infection. The lowest concentration of SARS-CoV-2 viral copies this assay can detect is 131 copies/mL. A negative result does not preclude SARS-Cov-2 infection and should not be used as the sole basis for treatment or other patient management decisions. A negative result may occur with  improper specimen collection/handling, submission of specimen other than nasopharyngeal swab, presence of viral mutation(s) within the areas targeted by this assay, and inadequate number of viral copies (<  131 copies/mL). A negative result must be combined with clinical observations,  patient history, and epidemiological information. The expected result is Negative.  Fact Sheet for Patients:  PinkCheek.be  Fact Sheet for Healthcare Providers:  GravelBags.it  This test is no t yet approved or cleared by the [place] FDA and  has been authorized for detection and/or diagnosis of SARS-CoV-2 by FDA under an Emergency Use Authorization (EUA). This EUA will remain  in effect (meaning this test can be used) for the duration of the COVID-19 declaration under Section 564(b)(1) of the Act, 21 U.S.C. section 360bbb-3(b)(1), unless the authorization is terminated or revoked sooner.     Influenza A by PCR NEGATIVE NEGATIVE Final   Influenza B by PCR NEGATIVE NEGATIVE Final    Comment: (NOTE) The Xpert Xpress SARS-CoV-2/FLU/RSV assay is intended as an aid in  the diagnosis of influenza from Nasopharyngeal swab specimens and  should not be used as a sole basis for treatment. Nasal washings and  aspirates are unacceptable for Xpert Xpress SARS-CoV-2/FLU/RSV  testing.  Fact Sheet for Patients: PinkCheek.be  Fact Sheet for Healthcare Providers: GravelBags.it  This test is not yet approved or cleared by the [place] FDA and  has been authorized for detection and/or diagnosis of SARS-CoV-2 by  FDA under an Emergency Use Authorization (EUA). This EUA will remain  in effect (meaning this test can be used) for the duration of the  Covid-19 declaration under Section 564(b)(1) of the Act, 21  U.S.C. section 360bbb-3(b)(1), unless the authorization is  terminated or revoked. Performed at [facility], [place] [address], [place], [place] [address]   Culture, blood (single)     Status: None (Preliminary result)   Collection Time: 01/24/20  4:36 AM   Specimen: BLOOD  Result Value Ref Range Status   Specimen Description   Final    BLOOD  RIGHT ANTECUBITAL Performed at [facility] [address] [place], [place] [address]    Special Requests   Final    BOTTLES DRAWN AEROBIC ONLY Blood Culture results may not be optimal due to an inadequate volume of blood received in culture bottles Performed at [facility] [address] [place], [place] [address]    Culture   Final    NO GROWTH < 12 HOURS Performed at [facility] [address] [place], [place] [address]    Report Status PENDING  Incomplete     RN Pressure Injury Documentation:     Estimated body mass index is 55.23 kg/m as calculated from the following:   Height as of this encounter: 5\' 6"  (1.676 m).   Weight as of this encounter: 155.2 kg.  Malnutrition Type:      Malnutrition Characteristics:      Nutrition Interventions:        Radiology Studies: DG Chest Port 1 View  Result Date: 01/23/2020 CLINICAL DATA:  [name].  Chest pain. EXAM: PORTABLE CHEST 1 VIEW COMPARISON:  January 11, 2020. FINDINGS: The heart size and mediastinal contours are within normal limits. No consolidation. No visible pleural effusions or pneumothorax. No acute osseous abnormality. IMPRESSION: No acute cardiopulmonary disease. Electronically Signed   By: Margaretha Sheffield MD   On: 01/23/2020 13:41   Scheduled Meds: . apixaban  5 mg Oral BID  . hydrALAZINE  25 mg Oral TID  . insulin aspart  0-15 Units Subcutaneous TID WC  . insulin aspart  0-5 Units Subcutaneous QHS  . isosorbide  mononitrate  60 mg Oral Daily  . potassium chloride  30 mEq Oral BID   Continuous Infusions: . lactated ringers 100 mL/hr at 01/24/20 0152  . magnesium sulfate bolus IVPB    . potassium chloride 10 mEq (01/24/20 1714)     LOS: 1 day   Kerney Elbe, DO [facility] PAGER is on [place]  If 7PM-7AM, please contact night-coverage [email or web address]

## 2020-01-24 NOTE — Progress Notes (Signed)
CRITICAL VALUE STICKER  CRITICAL VALUE: k+ 2.6  RECEIVER (on-site recipient of call): Dehaven Sine,RN  DATE & TIME NOTIFIED:  01/23/18 @0505  MESSENGER (representative from lab):  MD NOTIFIED: M. Denny  TIME OF NOTIFICATION:0510  RESPONSE: awaiting orders

## 2020-01-24 NOTE — Progress Notes (Signed)
Patient complaining 10/10 chest pain and abd pain. Dr. Alfredia Ferguson was text via secure text.  Patient stated that pain stems from abdomen and comes up the right side of the chest. Vitals taken. BP elevated. Nitro given as ordered. Will conitnue to monitor vitals. Will reassess and adminster toradol for pain, now that the patient is agreeable to take toradol. Care endorsed to day RN at bedside.

## 2020-01-25 ENCOUNTER — Inpatient Hospital Stay (HOSPITAL_COMMUNITY): Payer: Medicare Other

## 2020-01-25 DIAGNOSIS — N179 Acute kidney failure, unspecified: Secondary | ICD-10-CM | POA: Diagnosis not present

## 2020-01-25 DIAGNOSIS — I5022 Chronic systolic (congestive) heart failure: Secondary | ICD-10-CM | POA: Diagnosis not present

## 2020-01-25 DIAGNOSIS — G8929 Other chronic pain: Secondary | ICD-10-CM | POA: Diagnosis not present

## 2020-01-25 DIAGNOSIS — I10 Essential (primary) hypertension: Secondary | ICD-10-CM | POA: Diagnosis not present

## 2020-01-25 LAB — CBC WITH DIFFERENTIAL/PLATELET
Abs Immature Granulocytes: 0.06 10*3/uL (ref 0.00–0.07)
Basophils Absolute: 0.1 10*3/uL (ref 0.0–0.1)
Basophils Relative: 1 %
Eosinophils Absolute: 0.2 10*3/uL (ref 0.0–0.5)
Eosinophils Relative: 2 %
HCT: 28.6 % — ABNORMAL LOW (ref 36.0–46.0)
Hemoglobin: 8.4 g/dL — ABNORMAL LOW (ref 12.0–15.0)
Immature Granulocytes: 1 %
Lymphocytes Relative: 22 %
Lymphs Abs: 2.4 10*3/uL (ref 0.7–4.0)
MCH: 27.5 pg (ref 26.0–34.0)
MCHC: 29.4 g/dL — ABNORMAL LOW (ref 30.0–36.0)
MCV: 93.5 fL (ref 80.0–100.0)
Monocytes Absolute: 0.6 10*3/uL (ref 0.1–1.0)
Monocytes Relative: 6 %
Neutro Abs: 8 10*3/uL — ABNORMAL HIGH (ref 1.7–7.7)
Neutrophils Relative %: 68 %
Platelets: 394 10*3/uL (ref 150–400)
RBC: 3.06 MIL/uL — ABNORMAL LOW (ref 3.87–5.11)
RDW: 16.2 % — ABNORMAL HIGH (ref 11.5–15.5)
WBC: 11.3 10*3/uL — ABNORMAL HIGH (ref 4.0–10.5)
nRBC: 0 % (ref 0.0–0.2)

## 2020-01-25 LAB — COMPREHENSIVE METABOLIC PANEL
ALT: 15 U/L (ref 0–44)
AST: 21 U/L (ref 15–41)
Albumin: 3.1 g/dL — ABNORMAL LOW (ref 3.5–5.0)
Alkaline Phosphatase: 86 U/L (ref 38–126)
Anion gap: 11 (ref 5–15)
BUN: 13 mg/dL (ref 6–20)
CO2: 21 mmol/L — ABNORMAL LOW (ref 22–32)
Calcium: 7.8 mg/dL — ABNORMAL LOW (ref 8.9–10.3)
Chloride: 102 mmol/L (ref 98–111)
Creatinine, Ser: 1.89 mg/dL — ABNORMAL HIGH (ref 0.44–1.00)
GFR, Estimated: 31 mL/min — ABNORMAL LOW (ref >60)
Glucose, Bld: 330 mg/dL — ABNORMAL HIGH (ref 70–99)
Potassium: 3.5 mmol/L (ref 3.5–5.1)
Sodium: 134 mmol/L — ABNORMAL LOW (ref 135–145)
Total Bilirubin: 0.5 mg/dL (ref 0.3–1.2)
Total Protein: 6.7 g/dL (ref 6.5–8.1)

## 2020-01-25 LAB — C DIFFICILE QUICK SCREEN W PCR REFLEX
C Diff antigen: NEGATIVE
C Diff interpretation: NOT DETECTED
C Diff toxin: NEGATIVE

## 2020-01-25 LAB — GLUCOSE, CAPILLARY
Glucose-Capillary: 298 mg/dL — ABNORMAL HIGH (ref 70–99)
Glucose-Capillary: 284 mg/dL — ABNORMAL HIGH (ref 70–99)
Glucose-Capillary: 301 mg/dL — ABNORMAL HIGH (ref 70–99)
Glucose-Capillary: 313 mg/dL — ABNORMAL HIGH (ref 70–99)

## 2020-01-25 LAB — MAGNESIUM: Magnesium: 1.7 mg/dL (ref 1.7–2.4)

## 2020-01-25 LAB — URINE CULTURE

## 2020-01-25 LAB — PHOSPHORUS: Phosphorus: 3 mg/dL (ref 2.5–4.6)

## 2020-01-25 MED ORDER — MAGNESIUM SULFATE IN D5W 1-5 GM/100ML-% IV SOLN
1.0000 g | Freq: Once | INTRAVENOUS | Status: AC
  Administered 2020-01-25: 1 g via INTRAVENOUS
  Filled 2020-01-25: qty 100

## 2020-01-25 MED ORDER — INSULIN GLARGINE 100 UNIT/ML ~~LOC~~ SOLN
20.0000 [IU] | Freq: Every day | SUBCUTANEOUS | Status: DC
  Administered 2020-01-25 – 2020-01-26 (×2): 20 [IU] via SUBCUTANEOUS
  Filled 2020-01-25 (×2): qty 0.2

## 2020-01-25 MED ORDER — SODIUM CHLORIDE 0.9 % IV SOLN: INTRAVENOUS | Status: DC

## 2020-01-25 MED ORDER — HYDROMORPHONE HCL 1 MG/ML IJ SOLN
1.0000 mg | Freq: Three times a day (TID) | INTRAMUSCULAR | Status: DC | PRN
  Administered 2020-01-25 – 2020-01-27 (×7): 1 mg via INTRAVENOUS
  Filled 2020-01-25 (×7): qty 1

## 2020-01-25 NOTE — Progress Notes (Signed)
OT Cancellation Note  Patient Details Name: Deborah [name] MRN: [id number] DOB: 1965-09-09   Cancelled Treatment:    Reason Eval/Treat Not Completed: OT screened, no needs identified, will sign off. Patient declines any OT services, reports at her baseline, is w/c level at home.   Delbert Phenix OT OT pager: [phone]  Rosemary Holms 01/25/2020, 12:57 PM

## 2020-01-25 NOTE — Care Management Important Message (Signed)
Important Message  Patient Details IM Letter given to the Patient. Name: Deborah [name] MRN: [id number] Date of Birth: 1965/11/25   Medicare Important Message Given:  Yes     Kerin Salen 01/25/2020, 11:17 AM

## 2020-01-25 NOTE — Progress Notes (Signed)
PT Cancellation Note  Patient Details Name: Deborah [name] MRN: [id number] DOB: May 19, 1965   Cancelled Treatment:    Reason Eval/Treat Not Completed: PT screened, no needs identified, will sign off, patient declines PT, reports that she is at her baseline for transfers. Patient at  [facility] level PTA.    [name] 01/25/2020, 9:50 AM [place] Pager [phone] Office [phone]

## 2020-01-25 NOTE — Progress Notes (Signed)
PROGRESS NOTE    Deborah [name]  KGU:[id number] DOB: May 31, 1965 DOA: 01/23/2020 PCP: Cleophas Dunker, DO  Brief Narrative:  HPI per Dr. Gala Romney on 01/23/20 Deborah [name] is a 54 y.o. female with medical history significant of diabetic gastroparesis, diastolic dysfunction CHF diabetes, hypertension, chronic pain syndrome among other things who came to the ER with intractable nausea vomiting abdominal pain.  Patient was initially unable to keep anything down.  She continues to have pain at 8 out of 10.  Denied any diarrhea.  No hematemesis no melena no bright red blood per rectum.  Patient was found to be in acute kidney injury.  Appears to have a flareup of her gastroparesis.  Patient also has some chest discomfort/shortness of breath.  She was found to have acute kidney injury probably prerenal.  Patient being admitted to the hospital for management of her symptoms.  She was recently admitted to the hospital with similar findings.  Patient has history of carcinoid tumor and.  She has refused CT scan today to follow-up on her symptoms.  She was found to have mildly elevated troponin and BNP today but indeterminate range.  Also white count is elevated with lactic acidosis and she meets the criteria for sepsis.  Potassium down to 2.8.  Patient has chronic pain and is asking for pain medications which has been turned down especially her Dilaudid as that makes her gastroparesis worse.  We will admit her with sepsis as this could result of some abdominal source..  ED Course: Temperature 99.3 blood pressure 192/132 pulse one sixteen respiratory rate of twenty eighteen oxygen sat 97% on room air.  Potassium is 2.8 magnesium 1.3 troponin thirty-six lactic acid initially 2.7 but up to 4.5.  Urinalysis essentially negative.  Sodium is one forty oh five glucose two sixty-five and creatinine 1.39.  Albumin is 3.3.  Patient being admitted with sepsis as a result of possible abdominal  source.  **Interim History She continues to have significant abdominal pain and was having some nausea vomiting and states that she had "a little diarrhea.  Fixated on getting pain relief with IV Dilaudid.  She has a renal dysfunction now and her kidney function went up in the setting of her nausea, vomiting and diarrhea.  She is having some diarrhea and loose stools so this was sent down for C. difficile though I doubt that she has C. difficile.  She continues to have nausea and vomiting but continues to be fixated on getting IV Dilaudid as specifically requested by name  Assessment & Plan:   Principal Problem:   Sepsis ([place]) Active Problems:   Essential hypertension, benign   Type 2 diabetes mellitus with hyperglycemia, with long-term current use of insulin (HCC)   Hypokalemia   Hypomagnesemia   GERD (gastroesophageal reflux disease)   Morbid obesity (HCC)   Intractable nausea and vomiting   Chronic systolic CHF (congestive heart failure) (HCC)   Malignant carcinoid tumor of duodenum (HCC)   Chronic pain   AKI (acute kidney injury) ([place])  Sepsis due to abdominal source most likely, ruled out, - likley SIRS from Dehydration in the setting of her nausea vomiting and diarrhea; will rule out C. difficile -Patient had refused CT abdomen pelvis does have an abdominal pain that could be from chronic gastroparesis.   -She was having nausea, vomiting and some diarrhea and loose stools -No source of sepsis clearly identified and PCT is <0.10 and LA trended down.   -She could also have just  lactic acidosis and it peaked to 4.5 and this is now trended down and normalized at 1.5.   -Admitted the patient and treat her with IV antibiotics but will watch her off of Abx.   -Given IV Fluid Resuscitation and then renal function has worsened today and so we will continue IV normal saline at 75 MLS per hour.   -Urinalysis was unremarkable but did show greater than 500 glucose, rare bacteria, 0-5 squamous  epithelial cells, 0-5 RBCs per high-power field, negative leukocytes, negative nitrites, and 0-5 WBC -Urine culture still pending -WBC on admission was 15.2 and could be in setting of dehydration and has improved to 12.9 yesterday and today is 11.3 -Blood cultures also pending -Given sepsis fluid boluses of 4 L of LR and then placed on LR at 100 MLS per hour and this has been changed to normal saline at 75 MLS per hour given that she continues to have an AKI -Continue to monitor for further sources of infection as currently none have been identified  Hypokalemia, improved -Significantly low potassium on admission at 2.8 probably from nausea vomiting as well as hypomagnesemia.  -K+ this morning was 3.5 -Replete with IV KCl as well as p.o. KCl 30 mEq twice daily x2 doses for 2 days -Continue to monitor and treat as necessary Repeat CMP in a.m.  Hypomagnesemia -Mag level was 1.3 on admission and repeat this morning is 1.7 -Replete with IV mag sulfate 1 g given her renal dysfunction -Continue monitor and replete as necessary -Repeat mag level in a.m.  AKI, worsening Metabolic Acidosis, stable/improved -Mild worsening renal function probably secondary to prerenal causes.   -BUN/creatinine went from 19/1.39 -> 14/1.35 -> 13/129 -She had a mild metabolic acidosis with a CO2 of 20, anion gap of 12, chloride level of 105; now CO2 was 21, anion gap is 11, chloride level is 102 -Avoid further nephrotoxic medications, contrast dyes, hypotension and continue IV fluid hydration with LR at 100 MLS per hour for now and reduce to 75 mL for 12 more hours and this had stopped; given her worsening renal function we will resume fluids and normal saline at 75 mL's per hour -Check a bladder scan and renal ultrasound -Continue to monitor and trend and repeat CMP in a.m.  Diabetic Gastroparesis associated with abdominal pain  -Chronic.  Symptomatic management.   -Continue with supportive care and  antiemetics -Follow GI recommendations -Judicious use of narcotics and patient was unconsolable and pain so have reordered IV Dilaudid and have started weaning back short-term and also resumed her p.o. Dilaudid -We will not escalate narcotics and will de-escalate in the next day or so once her abdominal pain is better controlled; patient lost her IV access today and refused to take p.o. Dilaudid and will not give her any IM Dilaudid.  Hypertension -Resume home regimen when capable of taking p.o.'s -Blood pressures have been improved and was 138/88 today -continue monitor and trend blood pressures per protocol  Chronic pain syndrome -We will avoid narcotics as much as we can secondary to gastroparesis getting worse. -Judicious use given that she may have some pain seeking component and will not escalate regimen  Insulin-dependent Diabetes Mellitus Type 2 -Continue with sliding scale insulin and home regimen -CBGs have been ranging from 171-301 -We will try and ensure keeping blood sugars under 200. -Last HbA1c was 8.2  Normocytic Anemia -Likely has some iron deficiency component and acute drop is from dilution given that she may have been hemoconcentrated on admission -  Patient's hemoglobin/hematocrit went from 10.3/32.4 -> 8.8/29.2 -> and is now 8.4/28.6 -Check anemia panel in a.m.  -Continue to monitor for signs and symptoms of bleeding; currently no overt bleeding noted  -Repeat CBC in a.m.  Proximal atrial fibrillation -Continue with empiric anticoagulation with apixaban  Acquired thrombophilia  -in the setting of A. fib -We will continue with anticoagulation with apixaban 5 mg p.o. twice daily  Super Morbid Obesity -Complicates overall prognosis and care -Estimated body mass index is 55.23 kg/m as calculated from the following:   Height as of this encounter: 5\' 6"  (1.676 m).   Weight as of this encounter: 155.2 kg. -Weight Loss and Dietary Counseling given   DVT  prophylaxis: Anticoagulated with apixaban Code Status: FULL CODE  Family Communication: No family present at bedside Disposition Plan: Pending further clinical improvement of her abdominal pain and correction of electrolytes and tolerance of diet.  Status is: Inpatient  Remains inpatient appropriate because:Unsafe d/c plan, IV treatments appropriate due to intensity of illness or inability to take PO and Inpatient level of care appropriate due to severity of illness   Dispo: The patient is from: Home              Anticipated d/c is to: Home              Anticipated d/c date is: 2 days              Patient currently is not medically stable to d/c.  Consultants:   None   Procedures: None  Antimicrobials:  Anti-infectives (From admission, onward)   Start     Dose/Rate Route Frequency Ordered Stop   01/24/20 0200  meropenem (MERREM) 500 mg in sodium chloride 0.9 % 100 mL IVPB  Status:  Discontinued        500 mg 200 mL/hr over 30 Minutes Intravenous Every 8 hours 01/24/20 0016 01/24/20 1400   01/24/20 0115  ceFEPIme (MAXIPIME) 2 g in sodium chloride 0.9 % 100 mL IVPB  Status:  Discontinued        2 g 200 mL/hr over 30 Minutes Intravenous  Once 01/24/20 0016 01/24/20 0021   01/24/20 0000  ceFEPIme (MAXIPIME) 2 g in sodium chloride 0.9 % 100 mL IVPB  Status:  Discontinued        2 g 200 mL/hr over 30 Minutes Intravenous Every 8 hours 01/23/20 1900 01/24/20 1400   01/23/20 2200  metroNIDAZOLE (FLAGYL) tablet 500 mg  Status:  Discontinued        500 mg Oral Every 8 hours 01/23/20 1748 01/24/20 1400   01/23/20 2000  ceFEPIme (MAXIPIME) 2 g in sodium chloride 0.9 % 100 mL IVPB  Status:  Discontinued        2 g 200 mL/hr over 30 Minutes Intravenous Every 8 hours 01/23/20 1900 01/23/20 1900   01/23/20 1800  cefTRIAXone (ROCEPHIN) 2 g in sodium chloride 0.9 % 100 mL IVPB        2 g 200 mL/hr over 30 Minutes Intravenous  Once 01/23/20 1748 01/23/20 2202        Subjective: Seen and  examined at bedside and she is sitting in the chair at bedside and appeared calm and comfortable but continued to complain of significant abdominal pain and with not as much tears that she was yesterday.  Still fixated on getting IV Dilaudid and states that she needs to get out of her her acute pain until she can be changed to p.o. but refuses to take  the p.o.  No chest pain, lightheadedness or dizziness.  States that she is having some diarrhea and loose stools but they have not been watery.  Renal function has worsened so we will check a renal ultrasound  Objective: Vitals:   01/24/20 0813 01/24/20 2136 01/25/20 0514 01/25/20 1458  BP: (!) 159/83 136/67 105/67 138/88  Pulse: (!) 108 (!) 106 (!) 101 100  Resp: 18 18 16 19   Temp: 98.4 F (36.9 C) 97.9 F (36.6 C) 98.1 F (36.7 C) 97.8 F (36.6 C)  TempSrc:  Oral Oral Oral  SpO2: 100% 97% 100% 100%  Weight:      Height:        Intake/Output Summary (Last 24 hours) at 01/25/2020 1707 Last data filed at 01/25/2020 1500 Gross per 24 hour  Intake 672.59 ml  Output --  Net 672.59 ml   Filed Weights   01/24/20 0300  Weight: (!) 155.2 kg   Examination: Physical Exam:  Constitutional: WN/WD super morbidly obese African-American female in no acute distress sitting in the chair bedside and appears calm but does complain of being uncomfortable and having abdominal pain though Eyes: Lids and conjunctivae normal, sclerae anicteric  ENMT: External Ears, Nose appear normal. Grossly normal hearing.  Neck: Appears normal, supple, no cervical masses, normal ROM, no appreciable thyromegaly; no JVD Respiratory: Diminished to auscultation bilaterally, no wheezing, rales, rhonchi or crackles. Normal respiratory effort and patient is not tachypenic. No accessory muscle use.  Unlabored breathing Cardiovascular: RRR, no murmurs / rubs / gallops. S1 and S2 auscultated.  Has trace to 1+ lower extremity edema Abdomen: Soft, slightly tender-tender,  distended secondary to body habitus. Bowel sounds positive.  GU: Deferred. Musculoskeletal: No clubbing / cyanosis of digits/nails. No joint deformity upper and lower extremities.  Skin: No rashes, lesions, ulcers on limited skin evaluation. No induration; Warm and dry.  Neurologic: CN 2-12 grossly intact with no focal deficits. Romberg sign and cerebellar reflexes not assessed.  Psychiatric: Normal judgment and insight. Alert and oriented x 3.  Normal mood and appropriate affect.   Data Reviewed: I have personally reviewed following labs and imaging studies  CBC: Recent Labs  Lab 01/23/20 1315 01/24/20 0436 01/25/20 0428  WBC 15.2* 12.9* 11.3*  NEUTROABS  --   --  8.0*  HGB 10.3* 8.8* 8.4*  HCT 32.4* 29.2* 28.6*  MCV 87.1 91.3 93.5  PLT 521* 405* 517   Basic Metabolic Panel: Recent Labs  Lab 01/23/20 1400 01/23/20 1558 01/24/20 0436 01/25/20 0428  NA 141  --  137 134*  K 2.8*  --  2.6* 3.5  CL 105  --  105 102  CO2 19*  --  20* 21*  GLUCOSE 265*  --  176* 330*  BUN 19  --  14 13  CREATININE 1.39*  --  1.35* 1.89*  CALCIUM 8.7*  --  8.1* 7.8*  MG  --  1.3* 1.5* 1.7  PHOS  --   --   --  3.0   GFR: Estimated Creatinine Clearance: 51.9 mL/min (A) (by C-G formula based on SCr of 1.89 mg/dL (H)). Liver Function Tests: Recent Labs  Lab 01/23/20 1400 01/24/20 0436 01/25/20 0428  AST 21 14* 21  ALT 19 15 15   ALKPHOS 94 81 86  BILITOT 0.7 0.2* 0.5  PROT 7.5 6.5 6.7  ALBUMIN 3.3* 2.9* 3.1*   Recent Labs  Lab 01/23/20 1400  LIPASE 32   No results for input(s): AMMONIA in the last 168 hours. Coagulation Profile:  Recent Labs  Lab 01/24/20 0436  INR 1.1   Cardiac Enzymes: No results for input(s): CKTOTAL, CKMB, CKMBINDEX, TROPONINI in the last 168 hours. BNP (last 3 results) No results for input(s): PROBNP in the last 8760 hours. HbA1C: No results for input(s): HGBA1C in the last 72 hours. CBG: Recent Labs  Lab 01/24/20 1613 01/24/20 2138 01/25/20 0744  01/25/20 1144 01/25/20 1650  GLUCAP 187* 171* 298* 284* 301*   Lipid Profile: No results for input(s): CHOL, HDL, LDLCALC, TRIG, CHOLHDL, LDLDIRECT in the last 72 hours. Thyroid Function Tests: No results for input(s): TSH, T4TOTAL, FREET4, T3FREE, THYROIDAB in the last 72 hours. Anemia Panel: No results for input(s): VITAMINB12, FOLATE, FERRITIN, TIBC, IRON, RETICCTPCT in the last 72 hours. Sepsis Labs: Recent Labs  Lab 01/23/20 1515 01/23/20 1652 01/24/20 0436 01/24/20 0924  PROCALCITON  --   --  <0.10  --   LATICACIDVEN 2.7* 4.5*  --  1.9    Recent Results (from the past 240 hour(s))  Respiratory Panel by RT PCR (Flu A&B, Covid) - Nasopharyngeal Swab     Status: None   Collection Time: 01/23/20  1:35 PM   Specimen: Nasopharyngeal Swab  Result Value Ref Range Status   SARS Coronavirus 2 by RT PCR NEGATIVE NEGATIVE Final    Comment: (NOTE) SARS-CoV-2 target nucleic acids are NOT DETECTED.  The SARS-CoV-2 RNA is generally detectable in upper respiratoy specimens during the acute phase of infection. The lowest concentration of SARS-CoV-2 viral copies this assay can detect is 131 copies/mL. A negative result does not preclude SARS-Cov-2 infection and should not be used as the sole basis for treatment or other patient management decisions. A negative result may occur with  improper specimen collection/handling, submission of specimen other than nasopharyngeal swab, presence of viral mutation(s) within the areas targeted by this assay, and inadequate number of viral copies (<131 copies/mL). A negative result must be combined with clinical observations, patient history, and epidemiological information. The expected result is Negative.  Fact Sheet for Patients:  PinkCheek.be  Fact Sheet for Healthcare Providers:  GravelBags.it  This test is no t yet approved or cleared by the [place] FDA and  has been  authorized for detection and/or diagnosis of SARS-CoV-2 by FDA under an Emergency Use Authorization (EUA). This EUA will remain  in effect (meaning this test can be used) for the duration of the COVID-19 declaration under Section 564(b)(1) of the Act, 21 U.S.C. section 360bbb-3(b)(1), unless the authorization is terminated or revoked sooner.     Influenza A by PCR NEGATIVE NEGATIVE Final   Influenza B by PCR NEGATIVE NEGATIVE Final    Comment: (NOTE) The Xpert Xpress SARS-CoV-2/FLU/RSV assay is intended as an aid in  the diagnosis of influenza from Nasopharyngeal swab specimens and  should not be used as a sole basis for treatment. Nasal washings and  aspirates are unacceptable for Xpert Xpress SARS-CoV-2/FLU/RSV  testing.  Fact Sheet for Patients: PinkCheek.be  Fact Sheet for Healthcare Providers: GravelBags.it  This test is not yet approved or cleared by the [place] FDA and  has been authorized for detection and/or diagnosis of SARS-CoV-2 by  FDA under an Emergency Use Authorization (EUA). This EUA will remain  in effect (meaning this test can be used) for the duration of the  Covid-19 declaration under Section 564(b)(1) of the Act, 21  U.S.C. section 360bbb-3(b)(1), unless the authorization is  terminated or revoked. Performed at [facility], [place] [address] [place], [place] [address]  Urine culture     Status: Abnormal   Collection Time: 01/24/20 12:00 AM   Specimen: Urine, Clean Catch  Result Value Ref Range Status   Specimen Description   Final    URINE, CLEAN CATCH Performed at [facility], [place] [address] [place], [place] [address]    Special Requests   Final    NONE Performed at [facility], [place] [address] [place], [place] [address]    Culture MULTIPLE SPECIES PRESENT, SUGGEST RECOLLECTION (A)  Final   Report Status 01/25/2020 FINAL  Final   Culture, blood (single)     Status: None (Preliminary result)   Collection Time: 01/24/20  4:36 AM   Specimen: BLOOD  Result Value Ref Range Status   Specimen Description   Final    BLOOD RIGHT ANTECUBITAL Performed at [place] [address] [place], [place] [address]    Special Requests   Final    BOTTLES DRAWN AEROBIC ONLY Blood Culture results may not be optimal due to an inadequate volume of blood received in culture bottles Performed at [place] [address] [place], [place] [address]    Culture   Final    NO GROWTH 1 DAY Performed at [facility], [place] [address] [place], [place] [address]    Report Status PENDING  Incomplete     RN Pressure Injury Documentation:     Estimated body mass index is 55.23 kg/m as calculated from the following:   Height as of this encounter: 5\' 6"  (1.676 m).   Weight as of this encounter: 155.2 kg.  Malnutrition Type:      Malnutrition Characteristics:      Nutrition Interventions:        Radiology Studies: No results found. Scheduled Meds: . apixaban  5 mg Oral BID  . hydrALAZINE  25 mg Oral TID  . insulin aspart  0-15 Units Subcutaneous TID WC  . insulin aspart  0-5 Units Subcutaneous QHS  . insulin glargine  20 Units Subcutaneous Daily  . isosorbide mononitrate  60 mg Oral Daily  . potassium chloride  30 mEq Oral BID   Continuous Infusions: . sodium chloride 75 mL/hr at 01/25/20 0934     LOS: 2 days   Kerney Elbe, DO [facility] PAGER is on AMION  If 7PM-7AM, please contact night-coverage [email or web address]

## 2020-01-25 NOTE — Progress Notes (Signed)
Inpatient Diabetes Program Recommendations  AACE/ADA: New Consensus Statement on Inpatient Glycemic Control   Target Ranges:  Prepandial:   less than 140 mg/dL      Peak postprandial:   less than 180 mg/dL (1-2 hours)      Critically ill patients:  140 - 180 mg/dL  Results for KIALA, FARAJ (MRN [id number]) as of 01/25/2020 14:35  Ref. Range 01/24/2020 08:15 01/24/2020 16:13 01/24/2020 21:38 01/25/2020 07:44 01/25/2020 11:44  Glucose-Capillary Latest Ref Range: 70 - 99 mg/dL 253 (H) 187 (H) 171 (H) 298 (H) 284 (H)    Review of Glycemic Control  Diabetes history: DM2 Outpatient Diabetes medications: Lantus 50 units QHS, Novolog 30 units QID, Farxiga 10 mg daily Current orders for Inpatient glycemic control: Novolog 0-15 units TID with meals, Novolog 0-5 units QHS  Inpatient Diabetes Program Recommendations:    Insulin: Please consider ordering Lantus 20 units Q24H.  Thanks, Barnie Alderman, RN, MSN, CDE Diabetes Coordinator Inpatient Diabetes Program [phone] (Team Pager from 8am to 5pm)

## 2020-01-26 DIAGNOSIS — I10 Essential (primary) hypertension: Secondary | ICD-10-CM | POA: Diagnosis not present

## 2020-01-26 DIAGNOSIS — K219 Gastro-esophageal reflux disease without esophagitis: Secondary | ICD-10-CM | POA: Diagnosis not present

## 2020-01-26 DIAGNOSIS — I5033 Acute on chronic diastolic (congestive) heart failure: Secondary | ICD-10-CM

## 2020-01-26 DIAGNOSIS — N179 Acute kidney failure, unspecified: Secondary | ICD-10-CM | POA: Diagnosis not present

## 2020-01-26 DIAGNOSIS — G8929 Other chronic pain: Secondary | ICD-10-CM | POA: Diagnosis not present

## 2020-01-26 LAB — COMPREHENSIVE METABOLIC PANEL
ALT: 14 U/L (ref 0–44)
AST: 14 U/L — ABNORMAL LOW (ref 15–41)
Albumin: 3.2 g/dL — ABNORMAL LOW (ref 3.5–5.0)
Alkaline Phosphatase: 97 U/L (ref 38–126)
Anion gap: 10 (ref 5–15)
BUN: 20 mg/dL (ref 6–20)
CO2: 22 mmol/L (ref 22–32)
Calcium: 8.4 mg/dL — ABNORMAL LOW (ref 8.9–10.3)
Chloride: 104 mmol/L (ref 98–111)
Creatinine, Ser: 2.05 mg/dL — ABNORMAL HIGH (ref 0.44–1.00)
GFR, Estimated: 28 mL/min — ABNORMAL LOW (ref >60)
Glucose, Bld: 244 mg/dL — ABNORMAL HIGH (ref 70–99)
Potassium: 3.4 mmol/L — ABNORMAL LOW (ref 3.5–5.1)
Sodium: 136 mmol/L (ref 135–145)
Total Bilirubin: 0.4 mg/dL (ref 0.3–1.2)
Total Protein: 6.7 g/dL (ref 6.5–8.1)

## 2020-01-26 LAB — CBC WITH DIFFERENTIAL/PLATELET
Abs Immature Granulocytes: 0.05 10*3/uL (ref 0.00–0.07)
Basophils Absolute: 0.1 10*3/uL (ref 0.0–0.1)
Basophils Relative: 1 %
Eosinophils Absolute: 0.5 10*3/uL (ref 0.0–0.5)
Eosinophils Relative: 5 %
HCT: 28.7 % — ABNORMAL LOW (ref 36.0–46.0)
Hemoglobin: 8.5 g/dL — ABNORMAL LOW (ref 12.0–15.0)
Immature Granulocytes: 1 %
Lymphocytes Relative: 26 %
Lymphs Abs: 2.9 10*3/uL (ref 0.7–4.0)
MCH: 27.7 pg (ref 26.0–34.0)
MCHC: 29.6 g/dL — ABNORMAL LOW (ref 30.0–36.0)
MCV: 93.5 fL (ref 80.0–100.0)
Monocytes Absolute: 0.5 10*3/uL (ref 0.1–1.0)
Monocytes Relative: 5 %
Neutro Abs: 6.9 10*3/uL (ref 1.7–7.7)
Neutrophils Relative %: 62 %
Platelets: 384 10*3/uL (ref 150–400)
RBC: 3.07 MIL/uL — ABNORMAL LOW (ref 3.87–5.11)
RDW: 16.4 % — ABNORMAL HIGH (ref 11.5–15.5)
WBC: 11 10*3/uL — ABNORMAL HIGH (ref 4.0–10.5)
nRBC: 0 % (ref 0.0–0.2)

## 2020-01-26 LAB — PHOSPHORUS: Phosphorus: 3.6 mg/dL (ref 2.5–4.6)

## 2020-01-26 LAB — GLUCOSE, CAPILLARY
Glucose-Capillary: 250 mg/dL — ABNORMAL HIGH (ref 70–99)
Glucose-Capillary: 314 mg/dL — ABNORMAL HIGH (ref 70–99)
Glucose-Capillary: 310 mg/dL — ABNORMAL HIGH (ref 70–99)
Glucose-Capillary: 307 mg/dL — ABNORMAL HIGH (ref 70–99)

## 2020-01-26 LAB — MAGNESIUM: Magnesium: 1.9 mg/dL (ref 1.7–2.4)

## 2020-01-26 MED ORDER — FUROSEMIDE 10 MG/ML IJ SOLN
40.0000 mg | Freq: Once | INTRAMUSCULAR | Status: AC
  Administered 2020-01-26: 40 mg via INTRAVENOUS
  Filled 2020-01-26: qty 4

## 2020-01-26 MED ORDER — POTASSIUM CHLORIDE CRYS ER 20 MEQ PO TBCR
40.0000 meq | EXTENDED_RELEASE_TABLET | Freq: Once | ORAL | Status: AC
  Administered 2020-01-26: 40 meq via ORAL
  Filled 2020-01-26: qty 2

## 2020-01-26 MED ORDER — INSULIN GLARGINE 100 UNIT/ML ~~LOC~~ SOLN
26.0000 [IU] | Freq: Every day | SUBCUTANEOUS | Status: DC
  Administered 2020-01-27: 26 [IU] via SUBCUTANEOUS
  Filled 2020-01-26 (×2): qty 0.26

## 2020-01-26 MED ORDER — FUROSEMIDE 10 MG/ML IJ SOLN
INTRAMUSCULAR | Status: AC
  Filled 2020-01-26: qty 4

## 2020-01-26 NOTE — Progress Notes (Signed)
PROGRESS NOTE    AUNDRIA [name]  LNL:[id number] DOB: April 26, 1965 DOA: 01/23/2020 PCP: Cleophas Dunker, DO  Brief Narrative:  HPI per Dr. Gala Romney on 01/23/20 Deborah [name] is a 54 y.o. female with medical history significant of diabetic gastroparesis, diastolic dysfunction CHF diabetes, hypertension, chronic pain syndrome among other things who came to the ER with intractable nausea vomiting abdominal pain.  Patient was initially unable to keep anything down.  She continues to have pain at 8 out of 10.  Denied any diarrhea.  No hematemesis no melena no bright red blood per rectum.  Patient was found to be in acute kidney injury.  Appears to have a flareup of her gastroparesis.  Patient also has some chest discomfort/shortness of breath.  She was found to have acute kidney injury probably prerenal.  Patient being admitted to the hospital for management of her symptoms.  She was recently admitted to the hospital with similar findings.  Patient has history of carcinoid tumor and.  She has refused CT scan today to follow-up on her symptoms.  She was found to have mildly elevated troponin and BNP today but indeterminate range.  Also white count is elevated with lactic acidosis and she meets the criteria for sepsis.  Potassium down to 2.8.  Patient has chronic pain and is asking for pain medications which has been turned down especially her Dilaudid as that makes her gastroparesis worse.  We will admit her with sepsis as this could result of some abdominal source..  ED Course: Temperature 99.3 blood pressure 192/132 pulse one sixteen respiratory rate of twenty eighteen oxygen sat 97% on room air.  Potassium is 2.8 magnesium 1.3 troponin thirty-six lactic acid initially 2.7 but up to 4.5.  Urinalysis essentially negative.  Sodium is one forty oh five glucose two sixty-five and creatinine 1.39.  Albumin is 3.3.  Patient being admitted with sepsis as a result of possible abdominal  source.  **Interim History She continues to have significant abdominal pain and was having some nausea vomiting and states that she had "a little diarrhea.  Fixated on getting pain relief with IV Dilaudid.  She has a renal dysfunction now and her kidney function went up in the setting of her nausea, vomiting and diarrhea.  She is having some diarrhea and loose stools so this was sent down for C. difficile though I doubt that she has C. difficile.  She continues to have nausea and vomiting but continues to be fixated on getting IV Dilaudid as specifically requested by name.  IV fluid has now been stopped due to slight volume overload.  Will start diuresis with IV Lasix  Assessment & Plan:   Principal Problem:   Sepsis ([place]) Active Problems:   Essential hypertension, benign   Type 2 diabetes mellitus with hyperglycemia, with long-term current use of insulin (HCC)   Hypokalemia   Hypomagnesemia   GERD (gastroesophageal reflux disease)   Morbid obesity (HCC)   Intractable nausea and vomiting   Chronic systolic CHF (congestive heart failure) (HCC)   Malignant carcinoid tumor of duodenum (HCC)   Chronic pain   AKI (acute kidney injury) ([place])  Sepsis due to abdominal source most likely, ruled out, - likley SIRS from Dehydration in the setting of her nausea vomiting and diarrhea; ruled out C. difficile -Patient had refused CT abdomen pelvis does have an abdominal pain that could be from chronic gastroparesis.   -She was having nausea, vomiting and some diarrhea and loose stools -No source of  sepsis clearly identified and PCT is <0.10 and LA trended down.   -She could also have just lactic acidosis and it peaked to 4.5 and this is now trended down and normalized at 1.5.   -Admitted the patient and treat her with IV antibiotics but will watch her off of Abx.   -Given IV Fluid Resuscitation and then renal function has worsened today and so we will continue IV normal saline at 75 MLS per hour.    -Urinalysis was unremarkable but did show greater than 500 glucose, rare bacteria, 0-5 squamous epithelial cells, 0-5 RBCs per high-power field, negative leukocytes, negative nitrites, and 0-5 WBC -Urine culture still pending -WBC on admission was 15.2 and could be in setting of dehydration and has improved to 11.0 now -Blood cultures also pending -Given sepsis fluid boluses of 4 L of LR and then placed on LR at 100 MLS per hour and this has been changed to normal saline at 75 MLS per hour given that she continues to have an AKI will stop and start diuresis given that she is volume overloaded -Continue to monitor for further sources of infection as currently none have been identified  Hypokalemia, improved -Significantly low potassium on admission at 2.8 probably from nausea vomiting as well as hypomagnesemia.  -K+ this morning was 3.4 -Replete again with p.o. KCl -Continue to monitor and treat as necessary Repeat CMP in a.m.  Hypomagnesemia -Mag level was 1.3 on admission and repeat this morning is 1.7 -Replete with IV mag sulfate 1 g given her renal dysfunction -Continue monitor and replete as necessary -Repeat mag level in a.m.  AKI, worsening and likely in the setting of hypervolemia Metabolic Acidosis, stable/improved -Mild worsening renal function probably secondary to prerenal causes.   -BUN/creatinine went from 19/1.39 -> 14/1.35 -> 13/1.89 -> and is now 20/2.05 -She had a mild metabolic acidosis with a CO2 of 20, anion gap of 12, chloride level of 105; now CO2 was 22, anion gap is 10, chloride level is 104 -Avoid further nephrotoxic medications, contrast dyes, hypotension and will discontinue IV fluid hydration and start Lasix given volume overload -Check a bladder scan and renal ultrasound -Continue to monitor and trend and repeat CMP in a.m.  Diabetic Gastroparesis associated with abdominal pain  -Chronic.  Symptomatic management.   -Continue with supportive care and  antiemetics -Follow GI recommendations -Judicious use of narcotics and patient was unconsolable and pain so have reordered IV Dilaudid and have started weaning back short-term and also resumed her p.o. Dilaudid -We will not escalate narcotics and will de-escalate in the next day or so once her abdominal pain is better controlled; patient lost her IV access today and refused to take p.o. Dilaudid and will not give her any IM Dilaudid.  Acute on Chronic Diastolic CHF -Appears volume overloaded -Patient is +3.072 L and will stop IV fluid hydration and start IV Lasix; Difficult to assess her volume status given her body habitus  -Continue strict I's and O's and daily weights -BNP was 165.0 -Repeat CXR in the AM to assess  Hypertension -Resume home regimen when capable of taking p.o.'s -Blood pressures have been improved and was 138/88 today -continue monitor and trend blood pressures per protocol  Chronic pain syndrome -We will avoid narcotics as much as we can secondary to gastroparesis getting worse. -Judicious use given that she may have some pain seeking component and will not escalate regimen -De-escalated IV Dilaudid to q8h  Insulin-dependent Diabetes Mellitus Type 2 -Continue with sliding  scale insulin and home regimen -Added Lantus 20 units subcu daily yesterday and will increase to 26 -Nursing states that patient is eating double portion meals -CBGs have been ranging from 284-313 -We will try and ensure keeping blood sugars under 200. -Last HbA1c was 8.2  Normocytic Anemia -Likely has some iron deficiency component and acute drop is from dilution given that she may have been hemoconcentrated on admission -Patient's hemoglobin/hematocrit went from 10.3/32.4 -> 8.8/29.2 -> 8.4/28.6 -> 8.5/20.7 -Check anemia panel in a.m.  -Continue to monitor for signs and symptoms of bleeding; currently no overt bleeding noted  -Repeat CBC in a.m.  Proximal atrial fibrillation -Continue  with empiric anticoagulation with apixaban  Acquired thrombophilia  -in the setting of A. fib -We will continue with anticoagulation with apixaban 5 mg p.o. twice daily  Super Morbid Obesity -Complicates overall prognosis and care -Estimated body mass index is 55.23 kg/m as calculated from the following:   Height as of this encounter: 5\' 6"  (1.676 m).   Weight as of this encounter: 155.2 kg. -Weight Loss and Dietary Counseling given   DVT prophylaxis: Anticoagulated with apixaban Code Status: FULL CODE  Family Communication: No family present at bedside Disposition Plan: Pending further clinical improvement of her abdominal pain and correction of electrolytes and tolerance of diet.  Status is: Inpatient  Remains inpatient appropriate because:Unsafe d/c plan, IV treatments appropriate due to intensity of illness or inability to take PO and Inpatient level of care appropriate due to severity of illness   Dispo: The patient is from: Home              Anticipated d/c is to: Home              Anticipated d/c date is: 1-2 days              Patient currently is not medically stable to d/c.  Consultants:   None   Procedures: None  Antimicrobials:  Anti-infectives (From admission, onward)   Start     Dose/Rate Route Frequency Ordered Stop   01/24/20 0200  meropenem (MERREM) 500 mg in sodium chloride 0.9 % 100 mL IVPB  Status:  Discontinued        500 mg 200 mL/hr over 30 Minutes Intravenous Every 8 hours 01/24/20 0016 01/24/20 1400   01/24/20 0115  ceFEPIme (MAXIPIME) 2 g in sodium chloride 0.9 % 100 mL IVPB  Status:  Discontinued        2 g 200 mL/hr over 30 Minutes Intravenous  Once 01/24/20 0016 01/24/20 0021   01/24/20 0000  ceFEPIme (MAXIPIME) 2 g in sodium chloride 0.9 % 100 mL IVPB  Status:  Discontinued        2 g 200 mL/hr over 30 Minutes Intravenous Every 8 hours 01/23/20 1900 01/24/20 1400   01/23/20 2200  metroNIDAZOLE (FLAGYL) tablet 500 mg  Status:  Discontinued         500 mg Oral Every 8 hours 01/23/20 1748 01/24/20 1400   01/23/20 2000  ceFEPIme (MAXIPIME) 2 g in sodium chloride 0.9 % 100 mL IVPB  Status:  Discontinued        2 g 200 mL/hr over 30 Minutes Intravenous Every 8 hours 01/23/20 1900 01/23/20 1900   01/23/20 1800  cefTRIAXone (ROCEPHIN) 2 g in sodium chloride 0.9 % 100 mL IVPB        2 g 200 mL/hr over 30 Minutes Intravenous  Once 01/23/20 1748 01/23/20 2202  Subjective: Seen and examined at bedside and and she is upset that I changed her Dilaudid dose to every 8.  She appeared to be comfortable but was complaining of abdominal pain.  Nursing states that she is eating 2 meals a days at a time and then intermittently complaining of nausea but had no vomiting.  Patient states that she vomited but has not been witnessed.  Denies any lightheadedness or dizziness.  No other concerns or complaints at this time.  Objective: Vitals:   01/25/20 1458 01/25/20 2039 01/26/20 0517 01/26/20 1323  BP: 138/88 137/77 (!) 151/92 138/86  Pulse: 100 (!) 104 96 (!) 101  Resp: 19 20 16 20   Temp: 97.8 F (36.6 C) 98.3 F (36.8 C) 97.9 F (36.6 C) 97.8 F (36.6 C)  TempSrc: Oral Oral Oral Oral  SpO2: 100% 100% 100% 99%  Weight:      Height:        Intake/Output Summary (Last 24 hours) at 01/26/2020 1945 Last data filed at 01/26/2020 1700 Gross per 24 hour  Intake 720 ml  Output --  Net 720 ml   Filed Weights   01/24/20 0300  Weight: (!) 155.2 kg   Examination: Physical Exam:  Constitutional: WN/WD super morbidly obese African-American female currently in, NAD and appears calm and comfortable but she is complaining of having nausea and pain Eyes: Lids and conjunctivae normal, sclerae anicteric  ENMT: External Ears, Nose appear normal. Grossly normal hearing.  Neck: Appears normal, supple, no cervical masses, normal ROM, no appreciable thyromegaly; no JVD Respiratory: Diminished to auscultation bilaterally with coarse breath, no  wheezing, rales, rhonchi or crackles. Normal respiratory effort and patient is not tachypenic. No accessory muscle use.  Unlabored breathing Cardiovascular: RRR, no murmurs / rubs / gallops. S1 and S2 auscultated.  Has 1+ lower extremity edema Abdomen: Soft, non-tender, distended secondary body habitus. Bowel sounds positive.  GU: Deferred. Musculoskeletal: No clubbing / cyanosis of digits/nails. No joint deformity upper and lower extremities.  Skin: No rashes, lesions, ulcers on limited skin evaluation. No induration; Warm and dry.  Neurologic: CN 2-12 grossly intact with no focal deficits. Romberg sign and cerebellar reflexes not assessed.  Psychiatric: Normal judgment and insight. Alert and oriented x 3. Normal mood and appropriate affect.   Data Reviewed: I have personally reviewed following labs and imaging studies  CBC: Recent Labs  Lab 01/23/20 1315 01/24/20 0436 01/25/20 0428 01/26/20 0430  WBC 15.2* 12.9* 11.3* 11.0*  NEUTROABS  --   --  8.0* 6.9  HGB 10.3* 8.8* 8.4* 8.5*  HCT 32.4* 29.2* 28.6* 28.7*  MCV 87.1 91.3 93.5 93.5  PLT 521* 405* 394 426   Basic Metabolic Panel: Recent Labs  Lab 01/23/20 1400 01/23/20 1558 01/24/20 0436 01/25/20 0428 01/26/20 0430  NA 141  --  137 134* 136  K 2.8*  --  2.6* 3.5 3.4*  CL 105  --  105 102 104  CO2 19*  --  20* 21* 22  GLUCOSE 265*  --  176* 330* 244*  BUN 19  --  14 13 20   CREATININE 1.39*  --  1.35* 1.89* 2.05*  CALCIUM 8.7*  --  8.1* 7.8* 8.4*  MG  --  1.3* 1.5* 1.7 1.9  PHOS  --   --   --  3.0 3.6   GFR: Estimated Creatinine Clearance: 47.8 mL/min (A) (by C-G formula based on SCr of 2.05 mg/dL (H)). Liver Function Tests: Recent Labs  Lab 01/23/20 1400 01/24/20 0436 01/25/20 0428  01/26/20 0430  AST 21 14* 21 14*  ALT 19 15 15 14   ALKPHOS 94 81 86 97  BILITOT 0.7 0.2* 0.5 0.4  PROT 7.5 6.5 6.7 6.7  ALBUMIN 3.3* 2.9* 3.1* 3.2*   Recent Labs  Lab 01/23/20 1400  LIPASE 32   No results for input(s):  AMMONIA in the last 168 hours. Coagulation Profile: Recent Labs  Lab 01/24/20 0436  INR 1.1   Cardiac Enzymes: No results for input(s): CKTOTAL, CKMB, CKMBINDEX, TROPONINI in the last 168 hours. BNP (last 3 results) No results for input(s): PROBNP in the last 8760 hours. HbA1C: No results for input(s): HGBA1C in the last 72 hours. CBG: Recent Labs  Lab 01/25/20 1650 01/25/20 2041 01/26/20 0745 01/26/20 1143 01/26/20 1625  GLUCAP 301* 313* 250* 314* 310*   Lipid Profile: No results for input(s): CHOL, HDL, LDLCALC, TRIG, CHOLHDL, LDLDIRECT in the last 72 hours. Thyroid Function Tests: No results for input(s): TSH, T4TOTAL, FREET4, T3FREE, THYROIDAB in the last 72 hours. Anemia Panel: No results for input(s): VITAMINB12, FOLATE, FERRITIN, TIBC, IRON, RETICCTPCT in the last 72 hours. Sepsis Labs: Recent Labs  Lab 01/23/20 1515 01/23/20 1652 01/24/20 0436 01/24/20 0924  PROCALCITON  --   --  <0.10  --   LATICACIDVEN 2.7* 4.5*  --  1.9    Recent Results (from the past 240 hour(s))  Respiratory Panel by RT PCR (Flu A&B, Covid) - Nasopharyngeal Swab     Status: None   Collection Time: 01/23/20  1:35 PM   Specimen: Nasopharyngeal Swab  Result Value Ref Range Status   SARS Coronavirus 2 by RT PCR NEGATIVE NEGATIVE Final    Comment: (NOTE) SARS-CoV-2 target nucleic acids are NOT DETECTED.  The SARS-CoV-2 RNA is generally detectable in upper respiratoy specimens during the acute phase of infection. The lowest concentration of SARS-CoV-2 viral copies this assay can detect is 131 copies/mL. A negative result does not preclude SARS-Cov-2 infection and should not be used as the sole basis for treatment or other patient management decisions. A negative result may occur with  improper specimen collection/handling, submission of specimen other than nasopharyngeal swab, presence of viral mutation(s) within the areas targeted by this assay, and inadequate number of viral  copies (<131 copies/mL). A negative result must be combined with clinical observations, patient history, and epidemiological information. The expected result is Negative.  Fact Sheet for Patients:  PinkCheek.be  Fact Sheet for Healthcare Providers:  GravelBags.it  This test is no t yet approved or cleared by the [place] FDA and  has been authorized for detection and/or diagnosis of SARS-CoV-2 by FDA under an Emergency Use Authorization (EUA). This EUA will remain  in effect (meaning this test can be used) for the duration of the COVID-19 declaration under Section 564(b)(1) of the Act, 21 U.S.C. section 360bbb-3(b)(1), unless the authorization is terminated or revoked sooner.     Influenza A by PCR NEGATIVE NEGATIVE Final   Influenza B by PCR NEGATIVE NEGATIVE Final    Comment: (NOTE) The Xpert Xpress SARS-CoV-2/FLU/RSV assay is intended as an aid in  the diagnosis of influenza from Nasopharyngeal swab specimens and  should not be used as a sole basis for treatment. Nasal washings and  aspirates are unacceptable for Xpert Xpress SARS-CoV-2/FLU/RSV  testing.  Fact Sheet for Patients: PinkCheek.be  Fact Sheet for Healthcare Providers: GravelBags.it  This test is not yet approved or cleared by the [place] FDA and  has been authorized for detection and/or diagnosis of SARS-CoV-2  by  FDA under an Emergency Use Authorization (EUA). This EUA will remain  in effect (meaning this test can be used) for the duration of the  Covid-19 declaration under Section 564(b)(1) of the Act, 21  U.S.C. section 360bbb-3(b)(1), unless the authorization is  terminated or revoked. Performed at [facility], [place] [address], [place], [place] [address]   Urine culture     Status: Abnormal   Collection Time: 01/24/20 12:00 AM   Specimen: Urine, Clean Catch   Result Value Ref Range Status   Specimen Description   Final    URINE, CLEAN CATCH Performed at [facility], [place] [address], [place], [place] [address]    Special Requests   Final    NONE Performed at [facility], [place] [address] [place], [place] [address]    Culture MULTIPLE SPECIES PRESENT, SUGGEST RECOLLECTION (A)  Final   Report Status 01/25/2020 FINAL  Final  Culture, blood (single)     Status: None (Preliminary result)   Collection Time: 01/24/20  4:36 AM   Specimen: BLOOD  Result Value Ref Range Status   Specimen Description   Final    BLOOD RIGHT ANTECUBITAL Performed at [facility] [address], [place], [place] [address]    Special Requests   Final    BOTTLES DRAWN AEROBIC ONLY Blood Culture results may not be optimal due to an inadequate volume of blood received in culture bottles Performed at [facility] [address] [place], [place] [address]    Culture   Final    NO GROWTH 2 DAYS Performed at [facility] [address], [place], [place] [address]    Report Status PENDING  Incomplete  C Difficile Quick Screen w PCR reflex     Status: None   Collection Time: 01/25/20  1:41 PM  Result Value Ref Range Status   C Diff antigen NEGATIVE NEGATIVE Final   C Diff toxin NEGATIVE NEGATIVE Final   C Diff interpretation No C. difficile detected.  Final    Comment: Performed at [facility], [place] [address], [place], [place] [address]     RN Pressure Injury Documentation:     Estimated body mass index is 55.23 kg/m as calculated from the following:   Height as of this encounter: 5\' 6"  (1.676 m).   Weight as of this encounter: 155.2 kg.  Malnutrition Type:    Malnutrition Characteristics:   Nutrition Interventions:   Radiology Studies: US RENAL  Result Date: 01/25/2020 CLINICAL DATA:  Acute renal insufficiency EXAM: RENAL / URINARY TRACT ULTRASOUND  COMPLETE COMPARISON:  None. FINDINGS: Right Kidney: Renal measurements: 12.2 x 5.0 x 5.7 cm = volume: 179.6 mL. Echogenicity within normal limits. No mass or hydronephrosis visualized. Left Kidney: Renal measurements: 11.5 x 6.1 x 7.2 cm = volume: 263.4 mL. Echogenicity within normal limits. No mass or hydronephrosis visualized. Bladder: The bladder is not assessed due to lack of distention. Other: None. IMPRESSION: The bladder is not assessed due to lack of distention. No renal abnormalities identified. Electronically Signed   By: Dorise Bullion III M.D   On: 01/25/2020 20:56   Scheduled Meds:  apixaban  5 mg Oral BID   hydrALAZINE  25 mg Oral TID   insulin aspart  0-15 Units Subcutaneous TID WC   insulin aspart  0-5 Units Subcutaneous QHS   [START ON 01/27/2020] insulin glargine  26 Units Subcutaneous Daily   isosorbide mononitrate  60 mg Oral Daily   Continuous Infusions:   LOS: 3 days   Kerney Elbe, DO [facility] PAGER is on [place]  If 7PM-7AM, please contact night-coverage [email or web address]

## 2020-01-26 NOTE — Progress Notes (Addendum)
Pt stated her extremities are feeling tight from extra fluid and asked that she be disconnected from continuous fluid. On call notified. No pitting edema assessed and lungs clear. Will continue to monitor.   [id number]   Lab results show increased Creatinine, RN informed pt and suggested IVF be restarted and to continue MD orders and plan of care. Pt agreed.

## 2020-01-27 DIAGNOSIS — G8929 Other chronic pain: Secondary | ICD-10-CM | POA: Diagnosis not present

## 2020-01-27 DIAGNOSIS — N179 Acute kidney failure, unspecified: Secondary | ICD-10-CM | POA: Diagnosis not present

## 2020-01-27 DIAGNOSIS — I10 Essential (primary) hypertension: Secondary | ICD-10-CM | POA: Diagnosis not present

## 2020-01-27 DIAGNOSIS — K219 Gastro-esophageal reflux disease without esophagitis: Secondary | ICD-10-CM | POA: Diagnosis not present

## 2020-01-27 DIAGNOSIS — I5023 Acute on chronic systolic (congestive) heart failure: Secondary | ICD-10-CM

## 2020-01-27 LAB — CBC WITH DIFFERENTIAL/PLATELET
Abs Immature Granulocytes: 0.03 10*3/uL (ref 0.00–0.07)
Basophils Absolute: 0.1 10*3/uL (ref 0.0–0.1)
Basophils Relative: 1 %
Eosinophils Absolute: 0.6 10*3/uL — ABNORMAL HIGH (ref 0.0–0.5)
Eosinophils Relative: 6 %
HCT: 28.6 % — ABNORMAL LOW (ref 36.0–46.0)
Hemoglobin: 8.4 g/dL — ABNORMAL LOW (ref 12.0–15.0)
Immature Granulocytes: 0 %
Lymphocytes Relative: 30 %
Lymphs Abs: 3.1 10*3/uL (ref 0.7–4.0)
MCH: 27.5 pg (ref 26.0–34.0)
MCHC: 29.4 g/dL — ABNORMAL LOW (ref 30.0–36.0)
MCV: 93.5 fL (ref 80.0–100.0)
Monocytes Absolute: 0.6 10*3/uL (ref 0.1–1.0)
Monocytes Relative: 6 %
Neutro Abs: 5.8 10*3/uL (ref 1.7–7.7)
Neutrophils Relative %: 57 %
Platelets: 380 10*3/uL (ref 150–400)
RBC: 3.06 MIL/uL — ABNORMAL LOW (ref 3.87–5.11)
RDW: 16.2 % — ABNORMAL HIGH (ref 11.5–15.5)
WBC: 10.2 10*3/uL (ref 4.0–10.5)
nRBC: 0 % (ref 0.0–0.2)

## 2020-01-27 LAB — COMPREHENSIVE METABOLIC PANEL
ALT: 14 U/L (ref 0–44)
AST: 14 U/L — ABNORMAL LOW (ref 15–41)
Albumin: 3.2 g/dL — ABNORMAL LOW (ref 3.5–5.0)
Alkaline Phosphatase: 101 U/L (ref 38–126)
Anion gap: 9 (ref 5–15)
BUN: 20 mg/dL (ref 6–20)
CO2: 22 mmol/L (ref 22–32)
Calcium: 8.6 mg/dL — ABNORMAL LOW (ref 8.9–10.3)
Chloride: 107 mmol/L (ref 98–111)
Creatinine, Ser: 1.68 mg/dL — ABNORMAL HIGH (ref 0.44–1.00)
GFR, Estimated: 36 mL/min — ABNORMAL LOW (ref >60)
Glucose, Bld: 261 mg/dL — ABNORMAL HIGH (ref 70–99)
Potassium: 3.4 mmol/L — ABNORMAL LOW (ref 3.5–5.1)
Sodium: 138 mmol/L (ref 135–145)
Total Bilirubin: 0.5 mg/dL (ref 0.3–1.2)
Total Protein: 7 g/dL (ref 6.5–8.1)

## 2020-01-27 LAB — SODIUM, URINE, RANDOM: Sodium, Ur: 97 mmol/L

## 2020-01-27 LAB — PHOSPHORUS: Phosphorus: 3.2 mg/dL (ref 2.5–4.6)

## 2020-01-27 LAB — GLUCOSE, CAPILLARY
Glucose-Capillary: 239 mg/dL — ABNORMAL HIGH (ref 70–99)
Glucose-Capillary: 274 mg/dL — ABNORMAL HIGH (ref 70–99)
Glucose-Capillary: 270 mg/dL — ABNORMAL HIGH (ref 70–99)
Glucose-Capillary: 310 mg/dL — ABNORMAL HIGH (ref 70–99)

## 2020-01-27 LAB — CREATININE, URINE, RANDOM: Creatinine, Urine: 33.77 mg/dL

## 2020-01-27 LAB — MAGNESIUM: Magnesium: 1.8 mg/dL (ref 1.7–2.4)

## 2020-01-27 LAB — OSMOLALITY, URINE: Osmolality, Ur: 279 mOsm/kg — ABNORMAL LOW (ref 300–900)

## 2020-01-27 MED ORDER — POTASSIUM CHLORIDE CRYS ER 20 MEQ PO TBCR
40.0000 meq | EXTENDED_RELEASE_TABLET | Freq: Two times a day (BID) | ORAL | Status: AC
  Administered 2020-01-27 (×2): 40 meq via ORAL
  Filled 2020-01-27 (×2): qty 2

## 2020-01-27 MED ORDER — FUROSEMIDE 10 MG/ML IJ SOLN
40.0000 mg | Freq: Once | INTRAMUSCULAR | Status: AC
  Administered 2020-01-27: 40 mg via INTRAVENOUS
  Filled 2020-01-27: qty 4

## 2020-01-27 NOTE — Progress Notes (Signed)
PROGRESS NOTE    Deborah [name]  QBH:[id number] DOB: 05/16/1965 DOA: 01/23/2020 PCP: Cleophas Dunker, DO  Brief Narrative:  HPI per Dr. Gala Romney on 01/23/20 Deborah [name] is a 54 y.o. female with medical history significant of diabetic gastroparesis, diastolic dysfunction CHF diabetes, hypertension, chronic pain syndrome among other things who came to the ER with intractable nausea vomiting abdominal pain.  Patient was initially unable to keep anything down.  She continues to have pain at 8 out of 10.  Denied any diarrhea.  No hematemesis no melena no bright red blood per rectum.  Patient was found to be in acute kidney injury.  Appears to have a flareup of her gastroparesis.  Patient also has some chest discomfort/shortness of breath.  She was found to have acute kidney injury probably prerenal.  Patient being admitted to the hospital for management of her symptoms.  She was recently admitted to the hospital with similar findings.  Patient has history of carcinoid tumor and.  She has refused CT scan today to follow-up on her symptoms.  She was found to have mildly elevated troponin and BNP today but indeterminate range.  Also white count is elevated with lactic acidosis and she meets the criteria for sepsis.  Potassium down to 2.8.  Patient has chronic pain and is asking for pain medications which has been turned down especially her Dilaudid as that makes her gastroparesis worse.  We will admit her with sepsis as this could result of some abdominal source..  ED Course: Temperature 99.3 blood pressure 192/132 pulse one sixteen respiratory rate of twenty eighteen oxygen sat 97% on room air.  Potassium is 2.8 magnesium 1.3 troponin thirty-six lactic acid initially 2.7 but up to 4.5.  Urinalysis essentially negative.  Sodium is one forty oh five glucose two sixty-five and creatinine 1.39.  Albumin is 3.3.  Patient being admitted with sepsis as a result of possible abdominal  source.  **Interim History She continues to have significant abdominal pain and was having some nausea vomiting and states that she had "a little diarrhea.  Fixated on getting pain relief with IV Dilaudid.  She has a renal dysfunction now and her kidney function went up in the setting of her nausea, vomiting and diarrhea.  She is having some diarrhea and loose stools so this was sent down for C. difficile though I doubt that she has C. difficile.  She continues to have nausea and vomiting but continues to be fixated on getting IV Dilaudid as specifically requested by name.  IV fluid has now been stopped due to slight volume overload.  Will start diuresis with IV Lasix saved another dose this morning. She is still +3.31 L and had an acute on chronic Systolic CHF.  Assessment & Plan:   Principal Problem:   Sepsis ([place]) Active Problems:   Essential hypertension, benign   Type 2 diabetes mellitus with hyperglycemia, with long-term current use of insulin (HCC)   Hypokalemia   Hypomagnesemia   GERD (gastroesophageal reflux disease)   Morbid obesity (HCC)   Intractable nausea and vomiting   Chronic systolic CHF (congestive heart failure) (HCC)   Malignant carcinoid tumor of duodenum (HCC)   Chronic pain   AKI (acute kidney injury) ([place])  Sepsis due to abdominal source most likely, ruled out, - likley SIRS from Dehydration in the setting of her nausea vomiting and diarrhea; ruled out C. difficile -Patient had refused CT abdomen pelvis does have an abdominal pain that could be from  chronic gastroparesis.   -She was having nausea, vomiting and some diarrhea and loose stools -No source of sepsis clearly identified and PCT is <0.10 and LA trended down.   -She could also have just lactic acidosis and it peaked to 4.5 and this is now trended down and normalized at 1.5.   -Admitted the patient and treat her with IV antibiotics but will watch her off of Abx.   -Given IV Fluid Resuscitation and then  renal function has worsened today and so we will continue IV normal saline at 75 MLS per hour.   -Urinalysis was unremarkable but did show greater than 500 glucose, rare bacteria, 0-5 squamous epithelial cells, 0-5 RBCs per high-power field, negative leukocytes, negative nitrites, and 0-5 WBC -Urine culture still pending -WBC on admission was 15.2 and could be in setting of dehydration and has improved to 11.0 now -Blood cultures also pending -Given sepsis fluid boluses of 4 L of LR and then placed on LR at 100 MLS per hour and this has been changed to normal saline at 75 MLS per hour given that she continues to have an AKI will stop and start diuresis given that she is volume overloaded -Continue to monitor for further sources of infection as currently none have been identified  Hypokalemia, improved -Significantly low potassium on admission at 2.8 probably from nausea vomiting as well as hypomagnesemia.  -K+ this morning was 3.4 -Replete again with p.o. KCl -Continue to monitor and treat as necessary Repeat CMP in a.m.  Hypomagnesemia -Mag level was 1.3 on admission and repeat this morning is 1.8 -Replete with IV mag sulfate 1 g given her renal dysfunction -Continue monitor and replete as necessary -Repeat mag level in a.m.  AKI, worsening and likely in the setting of hypervolemia Metabolic Acidosis, stable/improved -Mild worsening renal function probably secondary to prerenal causes.   -BUN/creatinine went from 19/1.39 -> 14/1.35 -> 13/1.89 -> and is now 20/2.05 yesterday but likely in the setting of hypervolemia and now after diuresis her BUN/creatinine is improved to 20/1.60 and will get another dose of IV Lasix today -She had a mild metabolic acidosis with a CO2 of 20, anion gap of 12, chloride level of 105; now CO2 is 22, anion gap is 9, chloride level is 107 -Avoid further nephrotoxic medications, contrast dyes, hypotension and will discontinue IV fluid hydration and start Lasix  given volume overload; will give another dose of IV Lasix this morning and then this evening -Check a bladder scan and renal ultrasound -Continue to monitor and trend and repeat CMP in a.m.  Diabetic Gastroparesis associated with abdominal pain, improving  -Chronic.  Symptomatic management.   -Continue with supportive care and antiemetics -Follow GI recommendations -Judicious use of narcotics and patient was unconsolable and pain so have reordered IV Dilaudid and have started weaning back short-term and also resumed her p.o. Dilaudid -We will not escalate narcotics and will de-escalate in the next day or so once her abdominal pain is better controlled; patient lost her IV access today and refused to take p.o. Dilaudid and will not give her any IM Dilaudid. -Her pain is improving and she is not as nauseous last few days and has been tolerating her diet without issues.  She still complains of some abdominal pain but appears comfortable every time I have seen the patient.  Nursing states that she is tolerating her food and eats double portions and likely can be discharged home in a.m. when she is diuresed adequately  Acute on  Chronic Systolic CHF -Appears volume overloaded and likely in the setting of aggressive volume resuscitation on admission and during her hospitalization -Patient is +3.072 L and will stop IV fluid hydration and start IV Lasix; Difficult to assess her volume status given her body habitus and she still +3.312 L today -Continue strict I's and O's and daily weights -BNP was 165.0 and she does have some 1+ lower extremity edema still -Given a dose of IV Lasix yesterday with improvement in her renal function will give another dose this morning and this evening -Repeat CXR in the AM to assess  Hypertension -Resume home regimen when capable of taking p.o.'s -Blood pressures have been improved and was 144/88 -continue monitor and trend blood pressures per protocol  Chronic pain  syndrome -We will avoid narcotics as much as we can secondary to gastroparesis getting worse. -Judicious use given that she may have some pain seeking component and will not escalate regimen -De-escalated IV Dilaudid to q8h and will not escalate; will anticipate discharging home in a.m.  Insulin-dependent Diabetes Mellitus Type 2 -Continue with sliding scale insulin and home regimen -Added Lantus 20 units subcu daily yesterday and will increase to 26 -Nursing states that patient is eating double portion meals -CBGs have been ranging from 239-310 -We will try and ensure keeping blood sugars under 200. -Last HbA1c was 8.2  Normocytic Anemia -Likely has some iron deficiency component and acute drop is from dilution given that she may have been hemoconcentrated on admission -Patient's hemoglobin/hematocrit went from 10.3/32.4 -> 8.8/29.2 -> 8.4/28.6 -> 8.5/20.7 and it is relatively stable today at 8.4/28.6 -Check anemia panel in a.m.  -Continue to monitor for signs and symptoms of bleeding; currently no overt bleeding noted  -Repeat CBC in a.m.  Proximal atrial fibrillation -Continue with empiric anticoagulation with apixaban  Acquired thrombophilia  -in the setting of A. fib -We will continue with anticoagulation with apixaban 5 mg p.o. twice daily  Super Morbid Obesity -Complicates overall prognosis and care -Estimated body mass index is 55.23 kg/m as calculated from the following:   Height as of this encounter: 5\' 6"  (1.676 m).   Weight as of this encounter: 155.2 kg. -Weight Loss and Dietary Counseling given   DVT prophylaxis: Anticoagulated with apixaban Code Status: FULL CODE  Family Communication: No family present at bedside Disposition Plan: Pending further clinical improvement of her abdominal pain and correction of electrolytes and tolerance of diet.  Status is: Inpatient  Remains inpatient appropriate because:Unsafe d/c plan, IV treatments appropriate due to  intensity of illness or inability to take PO and Inpatient level of care appropriate due to severity of illness   Dispo: The patient is from: Home              Anticipated d/c is to: Home              Anticipated d/c date is: 1-2 days              Patient currently is not medically stable to d/c.  Consultants:   None   Procedures: None  Antimicrobials:  Anti-infectives (From admission, onward)   Start     Dose/Rate Route Frequency Ordered Stop   01/24/20 0200  meropenem (MERREM) 500 mg in sodium chloride 0.9 % 100 mL IVPB  Status:  Discontinued        500 mg 200 mL/hr over 30 Minutes Intravenous Every 8 hours 01/24/20 0016 01/24/20 1400   01/24/20 0115  ceFEPIme (MAXIPIME) 2 g in sodium  chloride 0.9 % 100 mL IVPB  Status:  Discontinued        2 g 200 mL/hr over 30 Minutes Intravenous  Once 01/24/20 0016 01/24/20 0021   01/24/20 0000  ceFEPIme (MAXIPIME) 2 g in sodium chloride 0.9 % 100 mL IVPB  Status:  Discontinued        2 g 200 mL/hr over 30 Minutes Intravenous Every 8 hours 01/23/20 1900 01/24/20 1400   01/23/20 2200  metroNIDAZOLE (FLAGYL) tablet 500 mg  Status:  Discontinued        500 mg Oral Every 8 hours 01/23/20 1748 01/24/20 1400   01/23/20 2000  ceFEPIme (MAXIPIME) 2 g in sodium chloride 0.9 % 100 mL IVPB  Status:  Discontinued        2 g 200 mL/hr over 30 Minutes Intravenous Every 8 hours 01/23/20 1900 01/23/20 1900   01/23/20 1800  cefTRIAXone (ROCEPHIN) 2 g in sodium chloride 0.9 % 100 mL IVPB        2 g 200 mL/hr over 30 Minutes Intravenous  Once 01/23/20 1748 01/23/20 2202        Subjective: Seen and examined at bedside she is sitting in the chair comfortable and still feels swollen but little bit better than yesterday.  No nausea or vomiting.  Denies any lightheadedness or dizziness.  Continues to have some pain but has been tolerating her food without issues.  No other concerns or complaints at this time and denies any CP or SOB.   Objective: Vitals:    01/27/20 0649 01/27/20 0659 01/27/20 1026 01/27/20 1315  BP: (!) 173/107 (!) 157/98 (!) 141/87 (!) 144/88  Pulse: (!) 112 (!) 110 (!) 101 (!) 102  Resp: 19  18 20   Temp: (!) 97.4 F (36.3 C)  98.1 F (36.7 C) 98.5 F (36.9 C)  TempSrc: Oral  Oral Axillary  SpO2: 100% 100% 100% 100%  Weight:      Height:        Intake/Output Summary (Last 24 hours) at 01/27/2020 1340 Last data filed at 01/26/2020 2100 Gross per 24 hour  Intake 720 ml  Output --  Net 720 ml   Filed Weights   01/24/20 0300  Weight: (!) 155.2 kg   Examination: Physical Exam:  Constitutional: WN/WD super morbidly obese African-American female in NAD and appears calm and comfortable Eyes: Lids and conjunctivae normal, sclerae anicteric  ENMT: External Ears, Nose appear normal. Grossly normal hearing. Neck: Appears normal, supple, no cervical masses, normal ROM, no appreciable thyromegaly; no JVD but difficult to assess due to her body habitus Respiratory: Diminishedto auscultation bilaterally with coarse breath sounds, no wheezing, rales, rhonchi or crackles. Normal respiratory effort and patient is not tachypenic. No accessory muscle use. Unlabored breathing  Cardiovascular: RRR, no murmurs / rubs / gallops. S1 and S2 auscultated. 1+ LE edema Abdomen: Soft, not tender, Distended due to body habitus. Bowel sounds positive.  GU: Deferred. Musculoskeletal: No clubbing / cyanosis of digits/nails. No joint deformity upper and lower extremities.  Skin: No rashes, lesions, ulcers. No induration; Warm and dry.  Neurologic: CN 2-12 grossly intact with no focal deficits. Romberg sign and cerebellar reflexes not assessed.  Psychiatric: Normal judgment and insight. Alert and oriented x 3. Normal mood and appropriate affect.   Data Reviewed: I have personally reviewed following labs and imaging studies  CBC: Recent Labs  Lab 01/23/20 1315 01/24/20 0436 01/25/20 0428 01/26/20 0430 01/27/20 0328  WBC 15.2* 12.9* 11.3*  11.0* 10.2  NEUTROABS  --   --  8.0* 6.9 5.8  HGB 10.3* 8.8* 8.4* 8.5* 8.4*  HCT 32.4* 29.2* 28.6* 28.7* 28.6*  MCV 87.1 91.3 93.5 93.5 93.5  PLT 521* 405* 394 384 195   Basic Metabolic Panel: Recent Labs  Lab 01/23/20 1400 01/23/20 1558 01/24/20 0436 01/25/20 0428 01/26/20 0430 01/27/20 0328  NA 141  --  137 134* 136 138  K 2.8*  --  2.6* 3.5 3.4* 3.4*  CL 105  --  105 102 104 107  CO2 19*  --  20* 21* 22 22  GLUCOSE 265*  --  176* 330* 244* 261*  BUN 19  --  14 13 20 20   CREATININE 1.39*  --  1.35* 1.89* 2.05* 1.68*  CALCIUM 8.7*  --  8.1* 7.8* 8.4* 8.6*  MG  --  1.3* 1.5* 1.7 1.9 1.8  PHOS  --   --   --  3.0 3.6 3.2   GFR: Estimated Creatinine Clearance: 58.4 mL/min (A) (by C-G formula based on SCr of 1.68 mg/dL (H)). Liver Function Tests: Recent Labs  Lab 01/23/20 1400 01/24/20 0436 01/25/20 0428 01/26/20 0430 01/27/20 0328  AST 21 14* 21 14* 14*  ALT 19 15 15 14 14   ALKPHOS 94 81 86 97 101  BILITOT 0.7 0.2* 0.5 0.4 0.5  PROT 7.5 6.5 6.7 6.7 7.0  ALBUMIN 3.3* 2.9* 3.1* 3.2* 3.2*   Recent Labs  Lab 01/23/20 1400  LIPASE 32   No results for input(s): AMMONIA in the last 168 hours. Coagulation Profile: Recent Labs  Lab 01/24/20 0436  INR 1.1   Cardiac Enzymes: No results for input(s): CKTOTAL, CKMB, CKMBINDEX, TROPONINI in the last 168 hours. BNP (last 3 results) No results for input(s): PROBNP in the last 8760 hours. HbA1C: No results for input(s): HGBA1C in the last 72 hours. CBG: Recent Labs  Lab 01/26/20 1143 01/26/20 1625 01/26/20 2132 01/27/20 0750 01/27/20 1202  GLUCAP 314* 310* 307* 239* 274*   Lipid Profile: No results for input(s): CHOL, HDL, LDLCALC, TRIG, CHOLHDL, LDLDIRECT in the last 72 hours. Thyroid Function Tests: No results for input(s): TSH, T4TOTAL, FREET4, T3FREE, THYROIDAB in the last 72 hours. Anemia Panel: No results for input(s): VITAMINB12, FOLATE, FERRITIN, TIBC, IRON, RETICCTPCT in the last 72 hours. Sepsis  Labs: Recent Labs  Lab 01/23/20 1515 01/23/20 1652 01/24/20 0436 01/24/20 0924  PROCALCITON  --   --  <0.10  --   LATICACIDVEN 2.7* 4.5*  --  1.9    Recent Results (from the past 240 hour(s))  Respiratory Panel by RT PCR (Flu A&B, Covid) - Nasopharyngeal Swab     Status: None   Collection Time: 01/23/20  1:35 PM   Specimen: Nasopharyngeal Swab  Result Value Ref Range Status   SARS Coronavirus 2 by RT PCR NEGATIVE NEGATIVE Final    Comment: (NOTE) SARS-CoV-2 target nucleic acids are NOT DETECTED.  The SARS-CoV-2 RNA is generally detectable in upper respiratoy specimens during the acute phase of infection. The lowest concentration of SARS-CoV-2 viral copies this assay can detect is 131 copies/mL. A negative result does not preclude SARS-Cov-2 infection and should not be used as the sole basis for treatment or other patient management decisions. A negative result may occur with  improper specimen collection/handling, submission of specimen other than nasopharyngeal swab, presence of viral mutation(s) within the areas targeted by this assay, and inadequate number of viral copies (<131 copies/mL). A negative result must be combined with clinical observations, patient history, and epidemiological information. The expected result is Negative.  Fact Sheet for Patients:  PinkCheek.be  Fact Sheet for Healthcare Providers:  GravelBags.it  This test is no t yet approved or cleared by the [place] FDA and  has been authorized for detection and/or diagnosis of SARS-CoV-2 by FDA under an Emergency Use Authorization (EUA). This EUA will remain  in effect (meaning this test can be used) for the duration of the COVID-19 declaration under Section 564(b)(1) of the Act, 21 U.S.C. section 360bbb-3(b)(1), unless the authorization is terminated or revoked sooner.     Influenza A by PCR NEGATIVE NEGATIVE Final   Influenza B by PCR  NEGATIVE NEGATIVE Final    Comment: (NOTE) The Xpert Xpress SARS-CoV-2/FLU/RSV assay is intended as an aid in  the diagnosis of influenza from Nasopharyngeal swab specimens and  should not be used as a sole basis for treatment. Nasal washings and  aspirates are unacceptable for Xpert Xpress SARS-CoV-2/FLU/RSV  testing.  Fact Sheet for Patients: PinkCheek.be  Fact Sheet for Healthcare Providers: GravelBags.it  This test is not yet approved or cleared by the [place] FDA and  has been authorized for detection and/or diagnosis of SARS-CoV-2 by  FDA under an Emergency Use Authorization (EUA). This EUA will remain  in effect (meaning this test can be used) for the duration of the  Covid-19 declaration under Section 564(b)(1) of the Act, 21  U.S.C. section 360bbb-3(b)(1), unless the authorization is  terminated or revoked. Performed at [facility], [place] [address], [place], [place] [address]   Urine culture     Status: Abnormal   Collection Time: 01/24/20 12:00 AM   Specimen: Urine, Clean Catch  Result Value Ref Range Status   Specimen Description   Final    URINE, CLEAN CATCH Performed at [facility], [place] [address] [place], [place] [address]    Special Requests   Final    NONE Performed at [facility], [place] [address] [place], [place] [address]    Culture MULTIPLE SPECIES PRESENT, SUGGEST RECOLLECTION (A)  Final   Report Status 01/25/2020 FINAL  Final  Culture, blood (single)     Status: None (Preliminary result)   Collection Time: 01/24/20  4:36 AM   Specimen: BLOOD  Result Value Ref Range Status   Specimen Description   Final    BLOOD RIGHT ANTECUBITAL Performed at [place] [address] [place], [place] [address]    Special Requests   Final    BOTTLES DRAWN AEROBIC ONLY Blood Culture results may not be optimal due to an  inadequate volume of blood received in culture bottles Performed at [place] [address], [place], [place] [address]    Culture   Final    NO GROWTH 3 DAYS Performed at [facility], [place] [address], [place],  [address]    Report Status PENDING  Incomplete  C Difficile Quick Screen w PCR reflex     Status: None   Collection Time: 01/25/20  1:41 PM  Result Value Ref Range Status   C Diff antigen NEGATIVE NEGATIVE Final   C Diff toxin NEGATIVE NEGATIVE Final   C Diff interpretation No C. difficile detected.  Final    Comment: Performed at [facility], [place] [address], [place],  [address]     RN Pressure Injury Documentation:     Estimated body mass index is 55.23 kg/m as calculated from the following:   Height as of this encounter: 5\' 6"  (  1.676 m).   Weight as of this encounter: 155.2 kg.  Malnutrition Type:    Malnutrition Characteristics:   Nutrition Interventions:   Radiology Studies: US RENAL  Result Date: 01/25/2020 CLINICAL DATA:  Acute renal insufficiency EXAM: RENAL / URINARY TRACT ULTRASOUND COMPLETE COMPARISON:  None. FINDINGS: Right Kidney: Renal measurements: 12.2 x 5.0 x 5.7 cm = volume: 179.6 mL. Echogenicity within normal limits. No mass or hydronephrosis visualized. Left Kidney: Renal measurements: 11.5 x 6.1 x 7.2 cm = volume: 263.4 mL. Echogenicity within normal limits. No mass or hydronephrosis visualized. Bladder: The bladder is not assessed due to lack of distention. Other: None. IMPRESSION: The bladder is not assessed due to lack of distention. No renal abnormalities identified. Electronically Signed   By: Dorise Bullion III M.D   On: 01/25/2020 20:56   Scheduled Meds: . apixaban  5 mg Oral BID  . hydrALAZINE  25 mg Oral TID  . insulin aspart  0-15 Units Subcutaneous TID WC  . insulin aspart  0-5 Units Subcutaneous QHS  . insulin glargine  26 Units Subcutaneous Daily  . isosorbide  mononitrate  60 mg Oral Daily  . potassium chloride  40 mEq Oral BID   Continuous Infusions:   LOS: 4 days   Kerney Elbe, DO [facility] PAGER is on [place]  If 7PM-7AM, please contact night-coverage [email or web address]

## 2020-01-28 ENCOUNTER — Inpatient Hospital Stay (HOSPITAL_COMMUNITY): Payer: Medicare Other

## 2020-01-28 DIAGNOSIS — K219 Gastro-esophageal reflux disease without esophagitis: Secondary | ICD-10-CM | POA: Diagnosis not present

## 2020-01-28 DIAGNOSIS — E876 Hypokalemia: Secondary | ICD-10-CM | POA: Diagnosis not present

## 2020-01-28 DIAGNOSIS — G8929 Other chronic pain: Secondary | ICD-10-CM | POA: Diagnosis not present

## 2020-01-28 LAB — COMPREHENSIVE METABOLIC PANEL
ALT: 15 U/L (ref 0–44)
AST: 14 U/L — ABNORMAL LOW (ref 15–41)
Albumin: 2.9 g/dL — ABNORMAL LOW (ref 3.5–5.0)
Alkaline Phosphatase: 89 U/L (ref 38–126)
Anion gap: 8 (ref 5–15)
BUN: 24 mg/dL — ABNORMAL HIGH (ref 6–20)
CO2: 22 mmol/L (ref 22–32)
Calcium: 8.6 mg/dL — ABNORMAL LOW (ref 8.9–10.3)
Chloride: 107 mmol/L (ref 98–111)
Creatinine, Ser: 1.49 mg/dL — ABNORMAL HIGH (ref 0.44–1.00)
GFR, Estimated: 41 mL/min — ABNORMAL LOW (ref >60)
Glucose, Bld: 204 mg/dL — ABNORMAL HIGH (ref 70–99)
Potassium: 3.4 mmol/L — ABNORMAL LOW (ref 3.5–5.1)
Sodium: 137 mmol/L (ref 135–145)
Total Bilirubin: 0.4 mg/dL (ref 0.3–1.2)
Total Protein: 6.4 g/dL — ABNORMAL LOW (ref 6.5–8.1)

## 2020-01-28 LAB — CBC WITH DIFFERENTIAL/PLATELET
Abs Immature Granulocytes: 0.02 10*3/uL (ref 0.00–0.07)
Basophils Absolute: 0.1 10*3/uL (ref 0.0–0.1)
Basophils Relative: 1 %
Eosinophils Absolute: 0.5 10*3/uL (ref 0.0–0.5)
Eosinophils Relative: 5 %
HCT: 27.6 % — ABNORMAL LOW (ref 36.0–46.0)
Hemoglobin: 8.3 g/dL — ABNORMAL LOW (ref 12.0–15.0)
Immature Granulocytes: 0 %
Lymphocytes Relative: 33 %
Lymphs Abs: 3.1 10*3/uL (ref 0.7–4.0)
MCH: 27.9 pg (ref 26.0–34.0)
MCHC: 30.1 g/dL (ref 30.0–36.0)
MCV: 92.6 fL (ref 80.0–100.0)
Monocytes Absolute: 0.5 10*3/uL (ref 0.1–1.0)
Monocytes Relative: 6 %
Neutro Abs: 5.1 10*3/uL (ref 1.7–7.7)
Neutrophils Relative %: 55 %
Platelets: 346 10*3/uL (ref 150–400)
RBC: 2.98 MIL/uL — ABNORMAL LOW (ref 3.87–5.11)
RDW: 16 % — ABNORMAL HIGH (ref 11.5–15.5)
WBC: 9.3 10*3/uL (ref 4.0–10.5)
nRBC: 0 % (ref 0.0–0.2)

## 2020-01-28 LAB — URINE CULTURE: Culture: <10000 — AB

## 2020-01-28 LAB — GLUCOSE, CAPILLARY: Glucose-Capillary: 197 mg/dL — ABNORMAL HIGH (ref 70–99)

## 2020-01-28 LAB — PHOSPHORUS: Phosphorus: 3.3 mg/dL (ref 2.5–4.6)

## 2020-01-28 LAB — MAGNESIUM: Magnesium: 1.6 mg/dL — ABNORMAL LOW (ref 1.7–2.4)

## 2020-01-28 MED ORDER — DULOXETINE HCL 20 MG PO CPEP
40.0000 mg | ORAL_CAPSULE | Freq: Every day | ORAL | Status: DC
  Filled 2020-01-28: qty 2

## 2020-01-28 MED ORDER — INSULIN GLARGINE 100 UNIT/ML ~~LOC~~ SOLN: 32.0000 [IU] | Freq: Every day | SUBCUTANEOUS | Status: DC

## 2020-01-28 MED ORDER — MAGNESIUM SULFATE 2 GM/50ML IV SOLN: 2.0000 g | Freq: Once | INTRAVENOUS | Status: DC

## 2020-01-28 MED ORDER — LANTUS SOLOSTAR 100 UNIT/ML ~~LOC~~ SOPN: 50.0000 [IU] | PEN_INJECTOR | Freq: Every day | SUBCUTANEOUS | 11 refills | Status: DC

## 2020-01-28 MED ORDER — ATORVASTATIN CALCIUM 10 MG PO TABS: 10.0000 mg | ORAL_TABLET | Freq: Every day | ORAL | Status: DC

## 2020-01-28 MED ORDER — ALBUTEROL SULFATE (2.5 MG/3ML) 0.083% IN NEBU: 2.5000 mg | INHALATION_SOLUTION | Freq: Four times a day (QID) | RESPIRATORY_TRACT | Status: DC | PRN

## 2020-01-28 MED ORDER — FUROSEMIDE 10 MG/ML IJ SOLN: 40.0000 mg | Freq: Once | INTRAMUSCULAR | Status: DC

## 2020-01-28 MED ORDER — PANTOPRAZOLE SODIUM 40 MG PO TBEC: 40.0000 mg | DELAYED_RELEASE_TABLET | Freq: Two times a day (BID) | ORAL | Status: DC

## 2020-01-28 MED ORDER — LANTUS SOLOSTAR 100 UNIT/ML ~~LOC~~ SOPN: 32.0000 [IU] | PEN_INJECTOR | Freq: Every day | SUBCUTANEOUS | 0 refills | Status: DC

## 2020-01-28 MED ORDER — ONDANSETRON HCL 4 MG PO TABS
4.0000 mg | ORAL_TABLET | Freq: Four times a day (QID) | ORAL | 0 refills | Status: DC | PRN
Start: 2020-01-28 — End: 2020-02-26

## 2020-01-28 MED ORDER — METOPROLOL SUCCINATE ER 25 MG PO TB24: 25.0000 mg | ORAL_TABLET | Freq: Every day | ORAL | Status: DC

## 2020-01-28 MED ORDER — POTASSIUM CHLORIDE CRYS ER 20 MEQ PO TBCR: 40.0000 meq | EXTENDED_RELEASE_TABLET | Freq: Two times a day (BID) | ORAL | Status: DC

## 2020-01-28 NOTE — Progress Notes (Signed)
Patient requesting pain medication this morning.  Enemi RN brought patient her PO dilaudid which made patient very unhappy due to the fact that she prefers the IV dilaudid.  Patient was educated on the way her pain meds are ordered that the PO med is to be given first and can be followed by the IV med if the PO is ineffective.  Patient became very belligerent and was yelling loudly at both this RN and Equities trader.  Patient then demanded that the doctor be called and that he come see her "right now to get this straight".  Patient also states that all her nurses up until now have just given her the IV pain medication.  Patient informed that the MD would be notified that she is asking to see him, to which she responded "good get out of my room and don't speak to me again.  Dumb asses call yourselves nurses".  Dr Alfredia Ferguson messaged via secure chat and will be up to see the patient as soon as he can.

## 2020-01-28 NOTE — Plan of Care (Signed)

## 2020-01-28 NOTE — Discharge Summary (Signed)
Physician Discharge Summary  Deborah [name] ZPH:[id number] DOB: [birth year] DOA: 01/23/2020  PCP: Cleophas Dunker, DO  Admit date: 01/23/2020 Discharge date: 01/28/2020  Admitted From: Home Disposition: Home  Recommendations for Outpatient Follow-up:  1. Follow up with PCP in 1-2 weeks 2. Follow up with Gastroenterology within 1-2 weeks 3. Follow up and Establish with Cardiology as an outpatient 4. Please obtain CMP/CBC, Mag, Phos in one week 5. Please follow up on the following pending results:  Home Health: No Equipment/Devices: None    Discharge Condition: Stable  CODE STATUS: FULL CODE Diet recommendation: Heart Healthy Carb Modified Diet   Brief/Interim Summary: HPI per Dr. Gala Romney on 01/23/20 Deborah M [name] a 54 y.o.femalewith medical history significant ofdiabetic gastroparesis, diastolic dysfunction CHF diabetes, hypertension, chronic pain syndrome among other things who came to the ER with intractable nausea vomiting abdominal pain. Patient was initially unable to keep anything down. She continues to have pain at 8 out of 10. Denied any diarrhea. No hematemesis no melena no bright red blood per rectum. Patient was found to be in acute kidney injury. Appears to have a flareup of her gastroparesis. Patient also has some chestdiscomfort/shortness of breath. She was found to have acute kidney injury probably prerenal. Patient being admitted to the hospital for management of her symptoms. She was recently admitted to the hospital with similar findings. Patient has history of carcinoid tumor and. She has refused CT scan today to follow-up on her symptoms. She was found to have mildly elevated troponin and BNP today but indeterminate range. Also white count is elevated with lactic acidosis and she meets the criteria for sepsis. Potassium down to 2.8. Patient has chronic pain and is asking for pain medications which has been turned down  especially her Dilaudid as that makes her gastroparesis worse. We will admit her with sepsis as this could result of some abdominal source..  ED Course:Temperature 99.3 blood pressure 192/132 pulse one sixteen respiratory rate of twenty eighteen oxygen sat 97% on room air. Potassium is 2.8 magnesium 1.3 troponin thirty-six lactic acid initially 2.7 but up to 4.5. Urinalysis essentially negative. Sodium is one forty oh five glucose two sixty-five and creatinine 1.39. Albumin is 3.3. Patient being admitted with sepsis as a result of possible abdominal source.  **Interim History She continues to have significant abdominal pain and was having some nausea vomiting and states that she had "a little diarrhea.  Fixated on getting pain relief with IV Dilaudid.  She has a renal dysfunction now and her kidney function went up in the setting of her nausea, vomiting and diarrhea.  She is having some diarrhea and loose stools so this was sent down for C. difficile though I doubt that she has C. difficile.  She continues to have nausea and vomiting but continues to be fixated on getting IV Dilaudid as specifically requested by name.  IV fluid has now been stopped due to slight volume overload.  Will start diuresis with IV Lasix saved another dose this morning. She is still +3.31 L and had an acute on chronic Systolic CHF.  Patient diuresed well and her renal function started improving and given a dose of IV Lasix this morning again.  She was told to resume her home torsemide in the morning.  She was deemed medically stable to be discharged home however she was extremely upset as she did not receive her IV pain medications overnight despite appearing comfortable and sleeping through when it was due.  She wanted  it first thing this morning and the nurse did not provided to her and she became extremely belligerent and upset.  She started cursing at the staff and when I went to see her she just wanted to be  discharged home and was very unhappy with her care as she did not receive the IV Dilaudid.  I spoke to her and I told her that I would not discharge her with any IV Dilaudid and that she will be going home on her home regimen and she appeared comfortable without any nausea, vomiting and tolerated her breakfast without issues.  She did not appear to be in any pain and she was recommended to follow-up with PCP, gastroenterology as well as cardiology in outpatient setting.  Discharge Diagnoses:  Principal Problem:   Sepsis ([place]) Active Problems:   Essential hypertension, benign   Type 2 diabetes mellitus with hyperglycemia, with long-term current use of insulin ([facility])   Hypokalemia   Hypomagnesemia   GERD (gastroesophageal reflux disease)   Morbid obesity ([facility])   Intractable nausea and vomiting   Chronic systolic CHF (congestive heart failure) ([facility])   Malignant carcinoid tumor of duodenum ([facility])   Chronic pain   AKI (acute kidney injury) ([place])  Sepsis due to abdominal source most likely, ruled out, - likley SIRS from Dehydration in the setting of her nausea vomiting and diarrhea; ruled out C. difficile -Patient had refused CT abdomen pelvis does have an abdominal pain that could be from chronic gastroparesis.  -She was having nausea, vomiting and some diarrhea and loose stools -No source of sepsis clearly identified and PCT is <0.10 and LA trended down.  -She could also have just lactic acidosis and it peaked to 4.5 and this is now trended down and normalized at 1.5.  -Admitted the patient and treat her with IV antibiotics but will watch her off of Abx.  -Given IV Fluid Resuscitation and then renal function has worsened today and so we will continue IV normal saline at 75 MLS per hour.  -Urinalysis was unremarkable but did show greater than 500 glucose, rare bacteria, 0-5 squamous epithelial cells, 0-5 RBCs per high-power field, negative leukocytes, negative nitrites, and 0-5 WBC -Urine  culture still pending -WBC on admission was 15.2 and could be in setting of dehydration and has improved to 11.0 now -Blood cultures also pending -Given sepsis fluid boluses of 4 L of LR and then placed on LR at 100 MLS per hour and this has been changed to normal saline at 75 MLS per hour given that she continues to have an AKI will stop and start diuresis given that she is volume overloaded; she is improving with diuresis -Continue to monitor for further sources of infection as currently none have been identified\ -Not have any more GI complaints and has no more nausea, vomiting or diarrhea and likely was reactive in the setting of her diabetic gastroparesis  Hypokalemia, improved -Significantly low potassium on admission at 2.8 probably from nausea vomiting as well as hypomagnesemia.  -K+ this morning was 3.4 -Replete again with p.o. KCl again -Continue to monitor and treat as necessary -Repeat CMP within 1 week  Hypomagnesemia -Mag level was 1.3 on admission and repeat this morning is 1.6 -Replete with IV mag sulfate 2 g prior to discharge -Continue monitor and replete as necessary -Repeat mag level within 1 week  AKI, worsening and likely in the setting of hypervolemia and is now improved Metabolic Acidosis, stable/improved -Mild worsening renal function probably secondary to prerenal  causes.  -BUN/creatinine went from 19/1.39 -> 14/1.35 -> 13/1.89 -> and is now 20/2.05 yesterday but likely in the setting of hypervolemia and now after diuresis her BUN/creatinine is improved to 20/1.68 and further improved to 24/1.49 at the time of discharge -She had a mild metabolic acidosis with a CO2 of 20, anion gap of 12, chloride level of 105; now CO2 is 22, anion gap is 9, chloride level is 107 -Avoid further nephrotoxic medications, contrast dyes, hypotension and will discontinue IV fluid hydration and start Lasix given volume overload; will give another dose of IV Lasix this morning and then  this evening -Check a bladder scan and renal ultrasound -Continue to monitor and trend and repeat CMP in a.m.  Diabetic Gastroparesis associated with abdominal pain, improving  -Chronic. Symptomatic management.  -Continue with supportive care and antiemetics -Follow GI recommendations and follow-up with them as an outpatient -Judicious use of narcotics and patient was unconsolable and pain so have reordered IV Dilaudid and have started weaning back short-term and also resumed her p.o. Dilaudid -We will not escalate narcotics and will de-escalate in the next day or so once her abdominal pain is better controlled; patient lost her IV access today and refused to take p.o. Dilaudid and will not give her any IM Dilaudid. -Her pain is improving and she is not as nauseous last few days and has been tolerating her diet without issues.  She still complains of some abdominal pain but appears comfortable every time I have seen the patient.  Nursing states that she is tolerating her food and eats double portions and nonadherent to the small frequent meals recommended.  She did not appear to be in any pain in despite being 10 out of 10 pain.  She appeared comfortable and was on her phone most of the time.  I strongly feel that she has pain seeking behaviors.  She will need to follow-up with PCP and likely needs to get into a chronic pain clinic for adequate management  Acute on Chronic Systolic CHF, improved -Appears volume overloaded and likely in the setting of aggressive volume resuscitation on admission and during her hospitalization -Patient is +3.072 L and will stop IV fluid hydration and start IV Lasix; Difficult to assess her volume status given her body habitus and she still +3.312 L yesterday and would not allow accurate I's and O's today given that she is extremely upset and belligerent -Continue strict I's and O's and daily weights -BNP was 165.0 and she does have some 1+ lower extremity edema  still -Given IV Lasix twice daily yesterday and IV Lasix this morning again; will transition to p.o. torsemide in the morning -Repeat CXR this AM showed "Cardiac enlargement. No evidence of active pulmonary disease."  Hypertension -Resume home regimen when capable of taking p.o.'s -Blood pressures have been improved and was  143/86 -continue monitor and trend blood pressures per protocol  Chronic pain syndrome -We will avoid narcotics as much as we can secondary to gastroparesis getting worse. -Judicious use given that she may have some pain seeking component and will not escalate regimen -De-escalated IV Dilaudid to q8h and will not escalate -Strongly feel that patient had a pain seeking component given that she was comfortable on examination and in no acute distress with vital signs being stable. -She missed her as needed dose overnight as she was sleeping and aspirated this morning but because she did not get it she became extremely upset and belligerent -We will not write her for  any pain medications and she will be discharged on a home regimen -Strongly feel that she will be returning to the hospital to try and get some more IV narcotics at some point  Insulin-dependent Diabetes Mellitus Type 2 -Continue with sliding scale insulin and home regimen -Added Lantus 20 units subcu daily yesterday and will increase to 26 and further increase to 32 for discharge -Nursing states that patient is eating double portion meals -CBGs have been ranging from  197-310 -We will try and ensure keeping blood sugars under 200. -Last HbA1c was 8.2  Normocytic Anemia -Likely has some iron deficiency component and acute drop is from dilution given that she may have been hemoconcentrated on admission -Patient's hemoglobin/hematocrit went from 10.3/32.4 -> 8.8/29.2 -> 8.4/28.6 -> 8.5/20.7 and it is relatively stable yesterday at 8.4/28.6 and today it is 8.3/27.6 -Check anemia panel in a.m. in the  outpatient setting -Continue to monitor for signs and symptoms of bleeding; currently no overt bleeding noted  -Repeat CBC in a.m.  Proximal atrial fibrillation -Continue with empiric anticoagulation with apixaban -Resume home beta-blocker  Acquired thrombophilia  -in the setting of A. fib -We will continue with anticoagulation with apixaban 5 mg p.o. twice daily  Super Morbid Obesity -Complicates overall prognosis and care -Estimated body mass index is 54.23 kg/m as calculated from the following:   Height as of this encounter: '5\' 6"'  (1.676 m).   Weight as of this encounter: 152.4 kg. -Weight Loss and Dietary Counseling given    Discharge Instructions  Discharge Instructions    (HEART FAILURE PATIENTS) Call MD:  Anytime you have any of the following symptoms: 1) 3 pound weight gain in 24 hours or 5 pounds in 1 week 2) shortness of breath, with or without a dry hacking cough 3) swelling in the hands, feet or stomach 4) if you have to sleep on extra pillows at night in order to breathe.   Complete by: As directed    Call MD for:  difficulty breathing, headache or visual disturbances   Complete by: As directed    Call MD for:  extreme fatigue   Complete by: As directed    Call MD for:  hives   Complete by: As directed    Call MD for:  persistant dizziness or light-headedness   Complete by: As directed    Call MD for:  persistant nausea and vomiting   Complete by: As directed    Call MD for:  redness, tenderness, or signs of infection (pain, swelling, redness, odor or green/yellow discharge around incision site)   Complete by: As directed    Call MD for:  severe uncontrolled pain   Complete by: As directed    Call MD for:  temperature >100.4   Complete by: As directed    Diet - low sodium heart healthy   Complete by: As directed    Diet Carb Modified   Complete by: As directed    Discharge instructions   Complete by: As directed    You were cared for by a hospitalist  during your hospital stay. If you have any questions about your discharge medications or the care you received while you were in the hospital after you are discharged, you can call the unit and ask to speak with the hospitalist on call if the hospitalist that took care of you is not available. Once you are discharged, your primary care physician will handle any further medical issues. Please note that NO REFILLS for any discharge  medications will be authorized once you are discharged, as it is imperative that you return to your primary care physician (or establish a relationship with a primary care physician if you do not have one) for your aftercare needs so that they can reassess your need for medications and monitor your lab values.  Follow up with PCP, Cardiology, and Gastroenterology within 1-2 weeks. Take all medications as prescribed. If symptoms change or worsen please return to the ED for evaluation   Increase activity slowly   Complete by: As directed      Allergies as of 01/28/2020      Reactions   Diazepam [name]   Isovue [iopamidol] Anaphylaxis   11/28/17 Patient had seizure like activity and then 1 min code after 100 cc of isovue 300. Possible contrast allergy vs vasovagal episode   Lisinopril Anaphylaxis   Tongue and mouth swelling   Metoclopramide Other (See Comments)   Tardive dyskinesia   Penicillins Palpitations   Has patient had a PCN reaction causing immediate rash, facial/tongue/throat swelling, SOB or lightheadedness with hypotension: Yes, heart races Has patient had a PCN reaction causing severe rash involving mucus membranes or skin necrosis: No Has patient had a PCN reaction that required hospitalization: Yes  Has patient had a PCN reaction occurring within the last 10 years: No   Tolmetin Nausea Only, Other (See Comments)   ULCER   Aspirin Other (See Comments)   Irritates stomach ulcer    Nsaids Other (See Comments)   ULCER   Acetaminophen Nausea  Only, Other (See Comments)   Irritates stomach ulcer; Abdominal pain   Tramadol Nausea And Vomiting      Medication List    STOP taking these medications   ondansetron 4 MG disintegrating tablet Commonly known as: ZOFRAN-ODT     TAKE these medications   albuterol (2.5 MG/3ML) 0.083% nebulizer solution Commonly known as: PROVENTIL Take 3 mLs (2.5 mg total) by nebulization every 6 (six) hours as needed for wheezing or shortness of breath.   allopurinol 100 MG tablet Commonly known as: ZYLOPRIM Take 100 mg by mouth daily.   ALPRAZolam 1 MG tablet Commonly known as: XANAX Take 1 tablet (1 mg total) by mouth 2 (two) times daily as needed for anxiety.   atorvastatin 10 MG tablet Commonly known as: LIPITOR TAKE ONE TABLET BY MOUTH ONCE DAILY (AM) What changed: See the new instructions.   blood glucose meter kit and supplies Kit Dispense based on patient and insurance preference. Use up to four times daily as directed. (FOR ICD-9 250.00, 250.01).   cetirizine 10 MG tablet Commonly known as: ZYRTEC TAKE ONE TABLET BY MOUTH ONCE DAILY (AM) What changed: See the new instructions.   colchicine 0.6 MG tablet Take 2 tablets at 1 time (1.2 mg total) and 1 hour later take 1 tablet What changed:   how much to take  how to take this  when to take this  additional instructions   DULoxetine HCl 40 MG Cpep Take 40 mg by mouth in the morning and at bedtime.   Easy Comfort Pen Needles 31G X 5 MM Misc Generic drug: Insulin Pen Needle USE 3 TIMES A DAY FOR INSULIN ADMINISTRATION   Eliquis 5 MG Tabs tablet Generic drug: apixaban TAKE 1 TABLET BY MOUTH 2 (TWO) TIMES DAILY. What changed: See the new instructions.   Farxiga 10 MG Tabs tablet Generic drug: dapagliflozin propanediol Take 10 mg by mouth daily.   ferrous sulfate 325 (65 FE) MG tablet  Take 1 tablet (325 mg total) by mouth daily with breakfast.   fluticasone 50 MCG/ACT nasal spray Commonly known as: FLONASE Place  2 sprays into both nostrils daily as needed for allergies or rhinitis.   hydrALAZINE 25 MG tablet Commonly known as: APRESOLINE TAKE 1 TABLET BY MOUTH 3 (THREE) TIMES DAILY. FOR BLOOD PRESSURE What changed: See the new instructions.   HYDROmorphone 2 MG tablet Commonly known as: DILAUDID TAKE ONE-HALF TABLETS (1 MG TOTAL) BY MOUTH DAILY AS NEEDED FOR SEVERE PAIN. What changed: See the new instructions.   hydrOXYzine 10 MG tablet Commonly known as: ATARAX/VISTARIL Take 1 tablet (10 mg total) by mouth 3 (three) times daily as needed. What changed: reasons to take this   isosorbide mononitrate 60 MG 24 hr tablet Commonly known as: IMDUR TAKE 1 TABLET (60 MG TOTAL) BY MOUTH DAILY.   Lantus SoloStar 100 UNIT/ML Solostar Pen Generic drug: insulin glargine Inject 32 Units into the skin at bedtime. What changed: how much to take   melatonin 3 MG Tabs tablet TAKE 2 TABLETS BY MOUTH AT BEDTIME.   metoprolol succinate 25 MG 24 hr tablet Commonly known as: TOPROL-XL TAKE ONE TABLET BY MOUTH ONCE DAILY   nitroGLYCERIN 0.4 MG SL tablet Commonly known as: NITROSTAT Place 1 tablet (0.4 mg total) under the tongue every 5 (five) minutes as needed for chest pain. What changed: reasons to take this   NovoLOG FlexPen 100 UNIT/ML FlexPen Generic drug: insulin aspart Inject 15 Units into the skin 3 (three) times daily with meals. What changed:   how much to take  when to take this   ondansetron 4 MG tablet Commonly known as: ZOFRAN Take 1 tablet (4 mg total) by mouth every 6 (six) hours as needed for nausea.   pantoprazole 40 MG tablet Commonly known as: PROTONIX TAKE ONE TABLET BY MOUTH TWICE DAILY   torsemide 20 MG tablet Commonly known as: DEMADEX TAKE 2 TABLETS (40 MG TOTAL) BY MOUTH DAILY.       Follow-up Information    Meccariello, Bernita Raisin, DO. Call.   Specialty: Family Medicine Why: Within 1-2 weeks Contact information: [address] [place]  [address] [phone]        Sueanne Margarita, MD .   Specialty: Cardiology Contact information: [phone] [address] [place] [address] [phone]        Margaretmary Bayley, MD. Call.   Specialty: Internal Medicine Why: Follow up within 1-2 weeks Contact information: [address] [place] [address] [phone]              Allergies  Allergen Reactions  . Diazepam [name]  . Isovue [Iopamidol] Anaphylaxis    11/28/17 Patient had seizure like activity and then 1 min code after 100 cc of isovue 300. Possible contrast allergy vs vasovagal episode  . Lisinopril Anaphylaxis    Tongue and mouth swelling  . Metoclopramide Other (See Comments)    Tardive dyskinesia  . Penicillins Palpitations    Has patient had a PCN reaction causing immediate rash, facial/tongue/throat swelling, SOB or lightheadedness with hypotension: Yes, heart races Has patient had a PCN reaction causing severe rash involving mucus membranes or skin necrosis: No Has patient had a PCN reaction that required hospitalization: Yes  Has patient had a PCN reaction occurring within the last 10 years: No   . Tolmetin Nausea Only and Other (See Comments)    ULCER  . Aspirin Other (See Comments)  Irritates stomach ulcer   . Nsaids Other (See Comments)    ULCER  . Acetaminophen Nausea Only and Other (See Comments)    Irritates stomach ulcer; Abdominal pain  . Tramadol Nausea And Vomiting   Consultations:  None  Procedures/Studies: CT ABDOMEN PELVIS WO CONTRAST  Result Date: 01/11/2020 CLINICAL DATA:  Abdominal pain EXAM: CT ABDOMEN AND PELVIS WITHOUT CONTRAST TECHNIQUE: Multidetector CT imaging of the abdomen and pelvis was performed following the standard protocol without IV contrast. COMPARISON:  11/26/2019 FINDINGS: Lower chest: No acute abnormality. Hepatobiliary: There is diffuse hepatic steatosis. No focal liver abnormality identified. Cholecystectomy. No  biliary dilatation. Pancreas: Unremarkable. No pancreatic ductal dilatation or surrounding inflammatory changes. Spleen: Normal in size without focal abnormality. Adrenals/Urinary Tract: Normal appearance of the adrenal glands. No kidney mass, calcification, or hydronephrosis identified. Bladder unremarkable. Stomach/Bowel: Stomach is normal no abnormal small bowel wall thickening, inflammation or distension. The: Appears diffusely collapsed. Chronic intramural fatty deposition is identified and appears similar to recent CT exams. Although nonspecific this may be seen in the setting of chronic such as inflammatory bowel disease. Mild diffuse colonic wall thickening is likely secondary to incomplete distension. No secondary signs scratch set no pericolonic inflammation or free fluid noted. Vascular/Lymphatic: Mild aortic atherosclerosis. No aneurysm. No abdominopelvic adenopathy. Reproductive: Exophytic fibroid arising off the left fundus of the uterus is again noted extending into the left adnexal region. Pelvic organs are otherwise unremarkable. Other: No free fluid or fluid collections. Musculoskeletal: L5-S1 degenerative disc disease noted. No acute or suspicious osseous findings. IMPRESSION: 1. No acute findings identified within the abdomen or pelvis. 2. Chronic intramural fatty deposition within the colon which may be seen in the setting of chronic inflammation. 3. Hepatic steatosis. 4. Uterine fibroid. 5. Aortic atherosclerosis. Aortic Atherosclerosis (ICD10-I70.0). Electronically Signed   By: Kerby Moors M.D.   On: 01/11/2020 05:58   US RENAL  Result Date: 01/25/2020 CLINICAL DATA:  Acute renal insufficiency EXAM: RENAL / URINARY TRACT ULTRASOUND COMPLETE COMPARISON:  None. FINDINGS: Right Kidney: Renal measurements: 12.2 x 5.0 x 5.7 cm = volume: 179.6 mL. Echogenicity within normal limits. No mass or hydronephrosis visualized. Left Kidney: Renal measurements: 11.5 x 6.1 x 7.2 cm = volume: 263.4 mL.  Echogenicity within normal limits. No mass or hydronephrosis visualized. Bladder: The bladder is not assessed due to lack of distention. Other: None. IMPRESSION: The bladder is not assessed due to lack of distention. No renal abnormalities identified. Electronically Signed   By: Dorise Bullion III M.D   On: 01/25/2020 20:56   DG CHEST PORT 1 VIEW  Result Date: 01/28/2020 CLINICAL DATA:  [name] EXAM: PORTABLE CHEST 1 VIEW COMPARISON:  01/23/2020 FINDINGS: Shallow inspiration. Cardiac enlargement. Lungs are clear. No pleural effusions. No pneumothorax. Mediastinal contours appear intact. IMPRESSION: Cardiac enlargement. No evidence of active pulmonary disease. Electronically Signed   By: Lucienne Capers M.D.   On: 01/28/2020 05:49   DG Chest Port 1 View  Result Date: 01/23/2020 CLINICAL DATA:  [name].  Chest pain. EXAM: PORTABLE CHEST 1 VIEW COMPARISON:  January 11, 2020. FINDINGS: The heart size and mediastinal contours are within normal limits. No consolidation. No visible pleural effusions or pneumothorax. No acute osseous abnormality. IMPRESSION: No acute cardiopulmonary disease. Electronically Signed   By: Margaretha Sheffield MD   On: 01/23/2020 13:41   DG Chest Portable 1 View  Result Date: 01/11/2020 CLINICAL DATA:  Fever. EXAM: PORTABLE CHEST 1 VIEW COMPARISON:  January 08, 2020. FINDINGS: Enlarged cardiac silhouette.  Both lungs are clear. No visible pleural effusions or pneumothorax. No acute osseous abnormality. IMPRESSION: 1. No acute cardiopulmonary disease. 2. Cardiomegaly. Electronically Signed   By: Margaretha Sheffield MD   On: 01/11/2020 11:03   DG Chest Portable 1 View  Result Date: 01/08/2020 CLINICAL DATA:  Chest pain. EXAM: PORTABLE CHEST 1 VIEW COMPARISON:  11/26/2019. FINDINGS: Cardiomegaly with pulmonary venous congestion. Bilateral pulmonary infiltrates/edema. No pleural effusion or pneumothorax. IMPRESSION: Cardiomegaly with pulmonary venous  congestion. Bilateral pulmonary infiltrates/edema. Findings most consistent with CHF. Electronically Signed   By: Marcello Moores  Register   On: 01/08/2020 06:59      Subjective: Seen and examined at bedside and she is sitting in the chair dressed and extremely belligerent.  She appeared comfortable but was complaining of significant 10 out of 10 pain and she was on her phone when I walked in the room.  She states that she is leaving because she is extremely unhappy with the care and wanted to be discharged given that her pain was not being adequately treated despite it being no evidence of her being actually in pain by nursing observation.  She allowed me to examine her but was complaining about the nursing staff the entire time.  She denies any nausea or vomiting now.  Will need to follow-up with PCP as well as cardiology and gastroenterology in the outpatient setting.  Discharge Exam: Vitals:   01/27/20 2054 01/28/20 0449  BP: (!) 156/72 (!) 143/86  Pulse: (!) 104 (!) 102  Resp: 20 16  Temp: 99.1 F (37.3 C) 98.2 F (36.8 C)  SpO2: 100% 98%   Vitals:   01/27/20 1026 01/27/20 1315 01/27/20 2054 01/28/20 0449  BP: (!) 141/87 (!) 144/88 (!) 156/72 (!) 143/86  Pulse: (!) 101 (!) 102 (!) 104 (!) 102  Resp: '18 20 20 16  ' Temp: 98.1 F (36.7 C) 98.5 F (36.9 C) 99.1 F (37.3 C) 98.2 F (36.8 C)  TempSrc: Oral Axillary Axillary Oral  SpO2: 100% 100% 100% 98%  Weight:    (!) 152.4 kg  Height:       General: Pt is alert, awake, not in acute distress Cardiovascular: RRR, S1/S2 +, no rubs, no gallops Respiratory: Diminished bilaterally, no wheezing, no rhonchi; unlabored breathing and not wearing any supplemental oxygen via nasal cannula Abdominal: Soft, NT, Distended 2/2 body habitus, bowel sounds + Extremities: Trace to 1+ LE edema, no cyanosis  The results of significant diagnostics from this hospitalization (including imaging, microbiology, ancillary and laboratory) are listed below for  reference.    Microbiology: Recent Results (from the past 240 hour(s))  Respiratory Panel by RT PCR (Flu A&B, Covid) - Nasopharyngeal Swab     Status: None   Collection Time: 01/23/20  1:35 PM   Specimen: Nasopharyngeal Swab  Result Value Ref Range Status   SARS Coronavirus 2 by RT PCR NEGATIVE NEGATIVE Final    Comment: (NOTE) SARS-CoV-2 target nucleic acids are NOT DETECTED.  The SARS-CoV-2 RNA is generally detectable in upper respiratoy specimens during the acute phase of infection. The lowest concentration of SARS-CoV-2 viral copies this assay can detect is 131 copies/mL. A negative result does not preclude SARS-Cov-2 infection and should not be used as the sole basis for treatment or other patient management decisions. A negative result may occur with  improper specimen collection/handling, submission of specimen other than nasopharyngeal swab, presence of viral mutation(s) within the areas targeted by this assay, and inadequate number of viral copies (<131 copies/mL). A negative result must  be combined with clinical observations, patient history, and epidemiological information. The expected result is Negative.  Fact Sheet for Patients:  PinkCheek.be  Fact Sheet for Healthcare Providers:  GravelBags.it  This test is no t yet approved or cleared by the [place] FDA and  has been authorized for detection and/or diagnosis of SARS-CoV-2 by FDA under an Emergency Use Authorization (EUA). This EUA will remain  in effect (meaning this test can be used) for the duration of the COVID-19 declaration under Section 564(b)(1) of the Act, 21 U.S.C. section 360bbb-3(b)(1), unless the authorization is terminated or revoked sooner.     Influenza A by PCR NEGATIVE NEGATIVE Final   Influenza B by PCR NEGATIVE NEGATIVE Final    Comment: (NOTE) The Xpert Xpress SARS-CoV-2/FLU/RSV assay is intended as an aid in  the diagnosis  of influenza from Nasopharyngeal swab specimens and  should not be used as a sole basis for treatment. Nasal washings and  aspirates are unacceptable for Xpert Xpress SARS-CoV-2/FLU/RSV  testing.  Fact Sheet for Patients: PinkCheek.be  Fact Sheet for Healthcare Providers: GravelBags.it  This test is not yet approved or cleared by the [place] FDA and  has been authorized for detection and/or diagnosis of SARS-CoV-2 by  FDA under an Emergency Use Authorization (EUA). This EUA will remain  in effect (meaning this test can be used) for the duration of the  Covid-19 declaration under Section 564(b)(1) of the Act, 21  U.S.C. section 360bbb-3(b)(1), unless the authorization is  terminated or revoked. Performed at [facility], [place] [address] [place], [place] [address]   Urine culture     Status: Abnormal   Collection Time: 01/24/20 12:00 AM   Specimen: Urine, Clean Catch  Result Value Ref Range Status   Specimen Description   Final    URINE, CLEAN CATCH Performed at [facility], [place] [address], [place], [place] [address]    Special Requests   Final    NONE Performed at [facility], [place] [address], [place], [place] [address]    Culture MULTIPLE SPECIES PRESENT, SUGGEST RECOLLECTION (A)  Final   Report Status 01/25/2020 FINAL  Final  Culture, blood (single)     Status: None (Preliminary result)   Collection Time: 01/24/20  4:36 AM   Specimen: BLOOD  Result Value Ref Range Status   Specimen Description   Final    BLOOD RIGHT ANTECUBITAL Performed at [place] [address] [place], [place] [address]    Special Requests   Final    BOTTLES DRAWN AEROBIC ONLY Blood Culture results may not be optimal due to an inadequate volume of blood received in culture bottles Performed at [place] [address],  [place], [place] [address]    Culture   Final    NO GROWTH 4 DAYS Performed at [facility], [place] [address] [place], [place] [address]    Report Status PENDING  Incomplete  C Difficile Quick Screen w PCR reflex     Status: None   Collection Time: 01/25/20  1:41 PM  Result Value Ref Range Status   C Diff antigen NEGATIVE NEGATIVE Final   C Diff toxin NEGATIVE NEGATIVE Final   C Diff interpretation No C. difficile detected.  Final    Comment: Performed at [facility], [place] [address] [place], [place] [address]  Culture, Urine     Status: Abnormal   Collection Time: 01/26/20 10:37 AM  Specimen: Urine, Clean Catch  Result Value Ref Range Status   Specimen Description   Final    URINE, CLEAN CATCH Performed at [facility], [place] [address], [place], [place] [address]    Special Requests   Final    NONE Performed at [facility], [place] [address], [place], [place] [address]    Culture (A)  Final    <10,000 COLONIES/mL INSIGNIFICANT GROWTH Performed at [place] [address], [place],  [address]    Report Status 01/28/2020 FINAL  Final     Labs: BNP (last 3 results) Recent Labs    08/19/19 1619 01/08/20 1220 01/23/20 1315  BNP 60.8 250.9* 563.1*   Basic Metabolic Panel: Recent Labs  Lab 01/24/20 0436 01/25/20 0428 01/26/20 0430 01/27/20 0328 01/28/20 0416  NA 137 134* 136 138 137  K 2.6* 3.5 3.4* 3.4* 3.4*  CL 105 102 104 107 107  CO2 20* 21* '22 22 22  ' GLUCOSE 176* 330* 244* 261* 204*  BUN '14 13 20 20 ' 24*  CREATININE 1.35* 1.89* 2.05* 1.68* 1.49*  CALCIUM 8.1* 7.8* 8.4* 8.6* 8.6*  MG 1.5* 1.7 1.9 1.8 1.6*  PHOS  --  3.0 3.6 3.2 3.3   Liver Function Tests: Recent Labs  Lab 01/24/20 0436 01/25/20 0428 01/26/20 0430 01/27/20 0328 01/28/20 0416  AST 14* 21 14* 14* 14*  ALT '15 15 14 14 15  ' ALKPHOS 81 86 97 101 89  BILITOT 0.2* 0.5 0.4 0.5 0.4  PROT 6.5 6.7 6.7 7.0 6.4*  ALBUMIN  2.9* 3.1* 3.2* 3.2* 2.9*   Recent Labs  Lab 01/23/20 1400  LIPASE 32   No results for input(s): AMMONIA in the last 168 hours. CBC: Recent Labs  Lab 01/24/20 0436 01/25/20 0428 01/26/20 0430 01/27/20 0328 01/28/20 0416  WBC 12.9* 11.3* 11.0* 10.2 9.3  NEUTROABS  --  8.0* 6.9 5.8 5.1  HGB 8.8* 8.4* 8.5* 8.4* 8.3*  HCT 29.2* 28.6* 28.7* 28.6* 27.6*  MCV 91.3 93.5 93.5 93.5 92.6  PLT 405* 394 384 380 346   Cardiac Enzymes: No results for input(s): CKTOTAL, CKMB, CKMBINDEX, TROPONINI in the last 168 hours. BNP: Invalid input(s): POCBNP CBG: Recent Labs  Lab 01/27/20 0750 01/27/20 1202 01/27/20 1645 01/27/20 2051 01/28/20 0752  GLUCAP 239* 274* 270* 310* 197*   D-Dimer No results for input(s): DDIMER in the last 72 hours. Hgb A1c No results for input(s): HGBA1C in the last 72 hours. Lipid Profile No results for input(s): CHOL, HDL, LDLCALC, TRIG, CHOLHDL, LDLDIRECT in the last 72 hours. Thyroid function studies No results for input(s): TSH, T4TOTAL, T3FREE, THYROIDAB in the last 72 hours.  Invalid input(s): FREET3 Anemia work up No results for input(s): VITAMINB12, FOLATE, FERRITIN, TIBC, IRON, RETICCTPCT in the last 72 hours. Urinalysis    Component Value Date/Time   COLORURINE STRAW (A) 01/24/2020 0000   APPEARANCEUR CLEAR 01/24/2020 0000   LABSPEC 1.008 01/24/2020 0000   PHURINE 5.0 01/24/2020 0000   GLUCOSEU >=500 (A) 01/24/2020 0000   HGBUR NEGATIVE 01/24/2020 0000   BILIRUBINUR NEGATIVE 01/24/2020 0000   KETONESUR NEGATIVE 01/24/2020 0000   PROTEINUR >=300 (A) 01/24/2020 0000   UROBILINOGEN 0.2 10/02/2013 2108   NITRITE NEGATIVE 01/24/2020 0000   LEUKOCYTESUR NEGATIVE 01/24/2020 0000   Sepsis Labs Invalid input(s): PROCALCITONIN,  WBC,  LACTICIDVEN Microbiology Recent Results (from the past 240 hour(s))  Respiratory Panel by RT PCR (Flu A&B, Covid) - Nasopharyngeal Swab     Status: None  Collection Time: 01/23/20  1:35 PM   Specimen:  Nasopharyngeal Swab  Result Value Ref Range Status   SARS Coronavirus 2 by RT PCR NEGATIVE NEGATIVE Final    Comment: (NOTE) SARS-CoV-2 target nucleic acids are NOT DETECTED.  The SARS-CoV-2 RNA is generally detectable in upper respiratoy specimens during the acute phase of infection. The lowest concentration of SARS-CoV-2 viral copies this assay can detect is 131 copies/mL. A negative result does not preclude SARS-Cov-2 infection and should not be used as the sole basis for treatment or other patient management decisions. A negative result may occur with  improper specimen collection/handling, submission of specimen other than nasopharyngeal swab, presence of viral mutation(s) within the areas targeted by this assay, and inadequate number of viral copies (<131 copies/mL). A negative result must be combined with clinical observations, patient history, and epidemiological information. The expected result is Negative.  Fact Sheet for Patients:  PinkCheek.be  Fact Sheet for Healthcare Providers:  GravelBags.it  This test is no t yet approved or cleared by the [place] FDA and  has been authorized for detection and/or diagnosis of SARS-CoV-2 by FDA under an Emergency Use Authorization (EUA). This EUA will remain  in effect (meaning this test can be used) for the duration of the COVID-19 declaration under Section 564(b)(1) of the Act, 21 U.S.C. section 360bbb-3(b)(1), unless the authorization is terminated or revoked sooner.     Influenza A by PCR NEGATIVE NEGATIVE Final   Influenza B by PCR NEGATIVE NEGATIVE Final    Comment: (NOTE) The Xpert Xpress SARS-CoV-2/FLU/RSV assay is intended as an aid in  the diagnosis of influenza from Nasopharyngeal swab specimens and  should not be used as a sole basis for treatment. Nasal washings and  aspirates are unacceptable for Xpert Xpress SARS-CoV-2/FLU/RSV  testing.  Fact Sheet  for Patients: PinkCheek.be  Fact Sheet for Healthcare Providers: GravelBags.it  This test is not yet approved or cleared by the [place] FDA and  has been authorized for detection and/or diagnosis of SARS-CoV-2 by  FDA under an Emergency Use Authorization (EUA). This EUA will remain  in effect (meaning this test can be used) for the duration of the  Covid-19 declaration under Section 564(b)(1) of the Act, 21  U.S.C. section 360bbb-3(b)(1), unless the authorization is  terminated or revoked. Performed at [facility], [place] [address] [place], [place] [address]   Urine culture     Status: Abnormal   Collection Time: 01/24/20 12:00 AM   Specimen: Urine, Clean Catch  Result Value Ref Range Status   Specimen Description   Final    URINE, CLEAN CATCH Performed at [facility], [place] [address], [place], [place] [address]    Special Requests   Final    NONE Performed at [facility], [place] [address] [place], [place] [address]    Culture MULTIPLE SPECIES PRESENT, SUGGEST RECOLLECTION (A)  Final   Report Status 01/25/2020 FINAL  Final  Culture, blood (single)     Status: None (Preliminary result)   Collection Time: 01/24/20  4:36 AM   Specimen: BLOOD  Result Value Ref Range Status   Specimen Description   Final    BLOOD RIGHT ANTECUBITAL Performed at [place] [address], [place], [place] [address]    Special Requests   Final    BOTTLES DRAWN AEROBIC ONLY Blood Culture results may not be optimal due to an inadequate volume of blood received in culture bottles Performed at  [facility], [place] [address], [place], [place] [address]    Culture   Final    NO GROWTH 4 DAYS Performed at [facility], [place] [address], [place], [place] [address]    Report Status PENDING  Incomplete  C Difficile Quick Screen w PCR reflex      Status: None   Collection Time: 01/25/20  1:41 PM  Result Value Ref Range Status   C Diff antigen NEGATIVE NEGATIVE Final   C Diff toxin NEGATIVE NEGATIVE Final   C Diff interpretation No C. difficile detected.  Final    Comment: Performed at [facility], [place] [address] [place], [place] [address]  Culture, Urine     Status: Abnormal   Collection Time: 01/26/20 10:37 AM   Specimen: Urine, Clean Catch  Result Value Ref Range Status   Specimen Description   Final    URINE, CLEAN CATCH Performed at [facility], [place] [address] [place], [place] [address]    Special Requests   Final    NONE Performed at [facility], [place] [address], [place], [place] [address]    Culture (A)  Final    <10,000 COLONIES/mL INSIGNIFICANT GROWTH Performed at [place] [address] [place],  [address]    Report Status 01/28/2020 FINAL  Final   Time coordinating discharge: 35 minutes  SIGNED:  Kerney Elbe, DO [facility] 01/28/2020, 10:27 AM Pager is on [place]  If 7PM-7AM, please contact night-coverage [email or web address]

## 2020-01-28 NOTE — Progress Notes (Signed)
This RN entered patient's room to go over her discharge papers with her.  Patient turned her back and refused to acknowledge this RN was in the room.  Discharge papers left on the bed for patient to review on her own.

## 2020-01-28 NOTE — Progress Notes (Signed)
Patient has discharge orders in at this time and has refused a morning assessment.

## 2020-01-29 ENCOUNTER — Institutional Professional Consult (permissible substitution): Payer: Medicare Other | Admitting: Internal Medicine

## 2020-01-29 LAB — CULTURE, BLOOD (SINGLE): Culture: NO GROWTH

## 2020-02-01 ENCOUNTER — Other Ambulatory Visit: Payer: Self-pay | Admitting: Family Medicine

## 2020-02-01 ENCOUNTER — Other Ambulatory Visit: Payer: Self-pay | Admitting: Physical Medicine and Rehabilitation

## 2020-02-04 ENCOUNTER — Telehealth: Payer: Self-pay

## 2020-02-04 ENCOUNTER — Inpatient Hospital Stay: Payer: Medicare Other

## 2020-02-04 ENCOUNTER — Inpatient Hospital Stay: Payer: Medicare Other | Attending: Hematology | Admitting: Hematology

## 2020-02-04 ENCOUNTER — Other Ambulatory Visit: Payer: Self-pay | Admitting: *Deleted

## 2020-02-04 DIAGNOSIS — C7A010 Malignant carcinoid tumor of the duodenum: Secondary | ICD-10-CM

## 2020-02-04 NOTE — Telephone Encounter (Signed)
Deborah [name] she missed her last appointment because she was out of town. Per [name] Patient she can not wait until her next appointment to get her pain medication. Please advise.

## 2020-02-04 NOTE — Telephone Encounter (Signed)
She was last seen here in September and needs to be seen monthly in order to receive controlled substances- she can request to be on the waitlist for an earlier appointment if she would like.

## 2020-02-05 ENCOUNTER — Telehealth: Payer: Self-pay | Admitting: *Deleted

## 2020-02-05 NOTE — Chronic Care Management (AMB) (Signed)
  Care Management   Note  02/05/2020 Name: Deborah [name] MRN: [id number] DOB: 1966-01-18  Deborah [name] is a 54 y.o. year old female who is a primary care patient of [place], Bernita Raisin, DO and is actively engaged with the care management team. I reached out to Deborah [name] by phone today to assist with re-scheduling a follow up visit with the RN Case Manager  Follow up plan: Unsuccessful telephone outreach attempt made. A HIPAA compliant phone message was left for the patient providing contact information and requesting a return call. The care management team will reach out to the patient again over the next 7 days. If patient returns call to provider office, please advise to call [place] at [phone].  [place] Management

## 2020-02-06 ENCOUNTER — Telehealth: Payer: Self-pay | Admitting: Hematology

## 2020-02-06 NOTE — Telephone Encounter (Signed)
Called pt per 12/1 sch msg - pt is aware she needs to reschedule , states she is in the hospital and she will give [place] a call back to reschedule.

## 2020-02-11 NOTE — Chronic Care Management (AMB) (Signed)
  Care Management   Note  02/11/2020 Name: Deborah [name] MRN: [id number] DOB: Jul 14, 1965  Deborah [name] is a 54 y.o. year old female who is a primary care patient of [place], Bernita Raisin, DO and is actively engaged with the care management team. I reached out to Deborah [name] by phone today to assist with re-scheduling a follow up visit with the RN Case Manager  Follow up plan: A second telephone outreach attempt made spoke to patient she is in the hospital. The care management team will reach out to the patient again over the next 7 days. If patient returns call to provider office, please advise to call [place] at [phone].  [place] Management

## 2020-02-17 NOTE — Progress Notes (Deleted)
    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 Marye Round for hospitalist at home program virtually on 12/7 BMP was repeated, creatinine noted to improve ***  PERTINENT  PMH / PSH: ***  OBJECTIVE:   LMP 10/10/2012   ***  ASSESSMENT/PLAN:   No problem-specific Assessment & Plan notes found for this encounter.     Cleophas Dunker, [place]

## 2020-02-18 ENCOUNTER — Ambulatory Visit: Payer: Medicare Other | Admitting: Family Medicine

## 2020-02-18 NOTE — Chronic Care Management (AMB) (Signed)
°  Care Management   Note  02/18/2020 Name: Deborah [name] MRN: [id number] DOB: 01-Jun-1965  Deborah [name] is a 54 y.o. year old female who is a primary care patient of [place], Bernita Raisin, DO and is actively engaged with the care management team. I reached out to Deborah [name] by phone today to assist with re-scheduling a follow up visit with the RN Case Manager  Follow up plan: Unsuccessful telephone outreach attempt made. A HIPAA compliant phone message was left for the patient providing contact information and requesting a return call. Unable to make contact on outreach attempts x 3. PCP Meccariello, Bernita Raisin, DO. notified via routed documentation in medical record. We have been unable to make contact with the patient for follow up. The care management team is available to follow up with the patient after provider conversation with the patient regarding recommendation for care management engagement and subsequent re-referral to the care management team. If patient returns call to provider office, please advise to call [place] at [place] Management

## 2020-02-19 ENCOUNTER — Telehealth: Payer: Self-pay | Admitting: *Deleted

## 2020-02-19 NOTE — Chronic Care Management (AMB) (Signed)
  Care Management   Note  02/19/2020 Name: Deborah [name] MRN: [id number] DOB: Jun 02, 1965  Deborah [name] is a 54 y.o. year old female who is a primary care patient of [place], Bernita Raisin, DO and is actively engaged with the care management team. I reached out to Deborah [name] by phone today to assist with re-scheduling a follow up visit with the RN Case Manager  Follow up plan: Unsuccessful telephone outreach attempt made. A HIPAA compliant phone message was left for the patient providing contact information and requesting a return call.  The care management team will reach out to the patient again over the next 7 days.  If patient returns call to provider office, please advise to call [place] at [phone].  [place] Management

## 2020-02-20 IMAGING — US US TRANSVAGINAL NON-OB
1 series · 13 of 25 positions shown · non-contrast
Comparison: CT of the abdomen pelvis dated 11/28/2017

CLINICAL DATA: 52-year-old female with left adnexal mass on CT.

EXAM:
TRANSABDOMINAL AND TRANSVAGINAL ULTRASOUND OF PELVIS
TECHNIQUE: Both transabdominal and transvaginal ultrasound examinations of the
pelvis were performed. Transabdominal technique was performed for
global imaging of the pelvis including uterus, ovaries, adnexal
regions, and pelvic cul-de-sac. It was necessary to proceed with
endovaginal exam following the transabdominal exam to visualize the
endometrium and ovaries..

[Series 1: us transvaginal non-ob · 0.24mm/px · 13 of 39 slices shown]
[im 1/39]
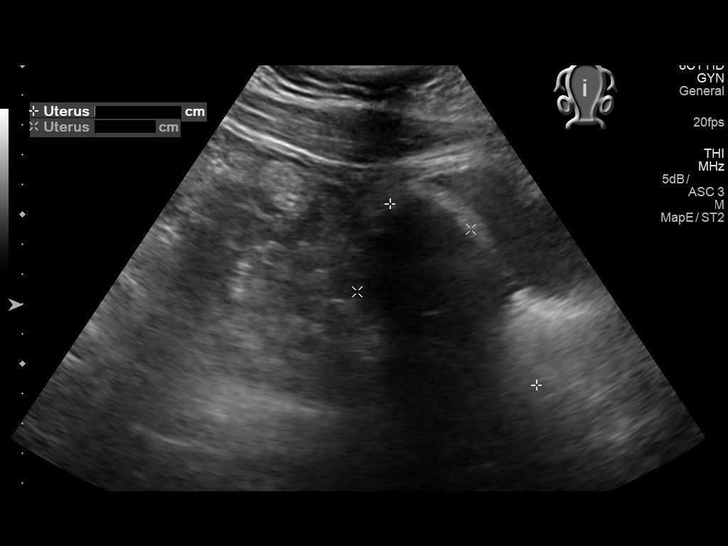
[im 4/39]
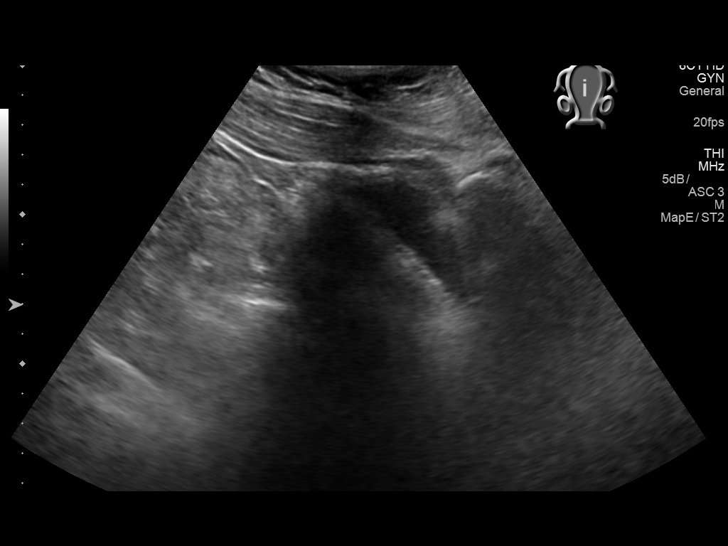
[im 7/39]
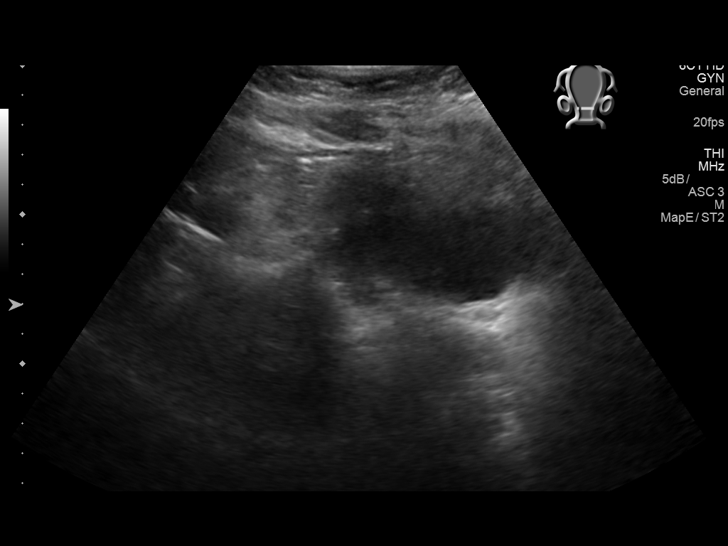
[im 10/39]
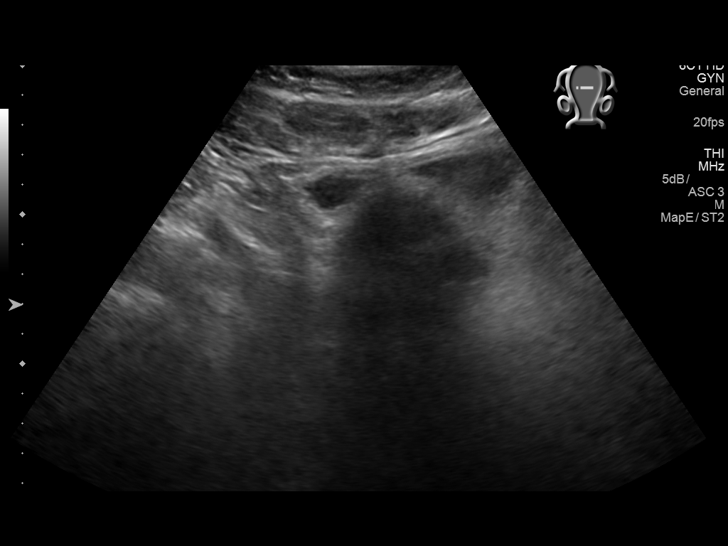
[im 13/39]
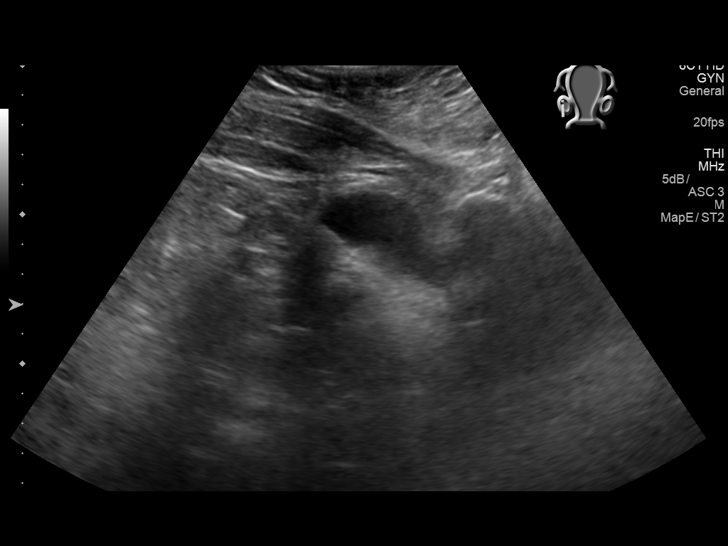
[im 16/39]
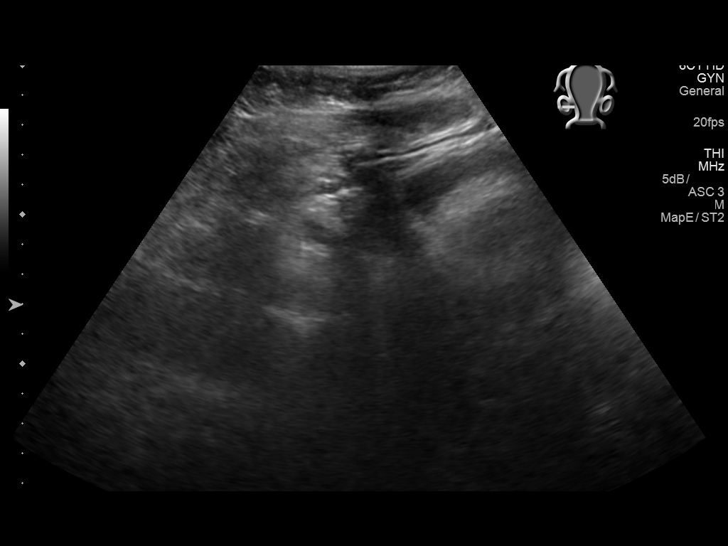
[im 20/39]
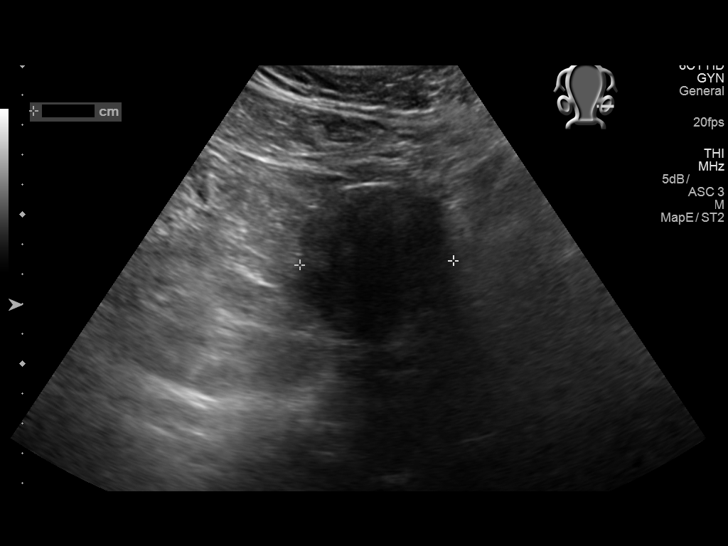
[im 23/39]
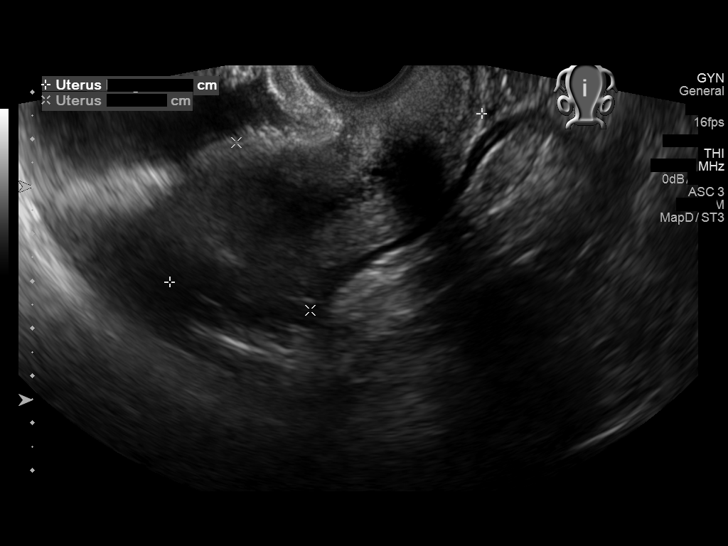
[im 26/39]
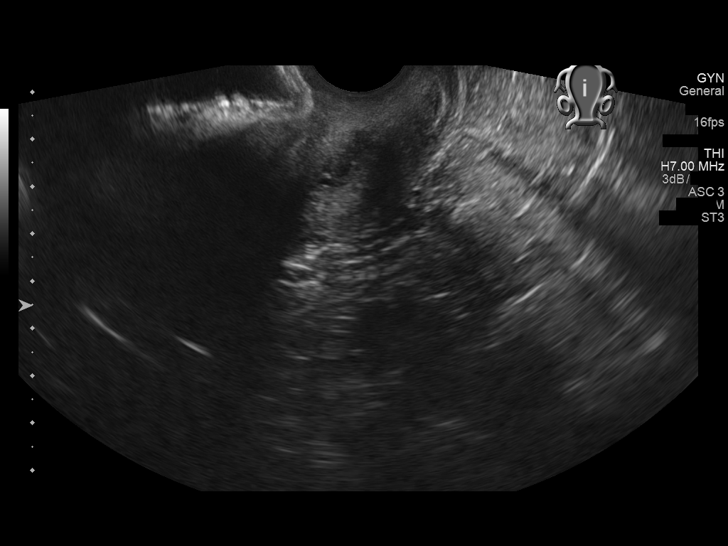
[im 29/39]
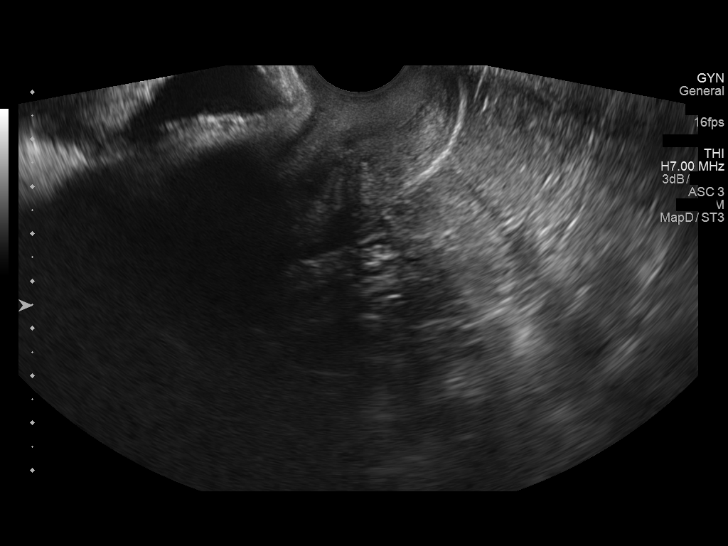
[im 32/39]
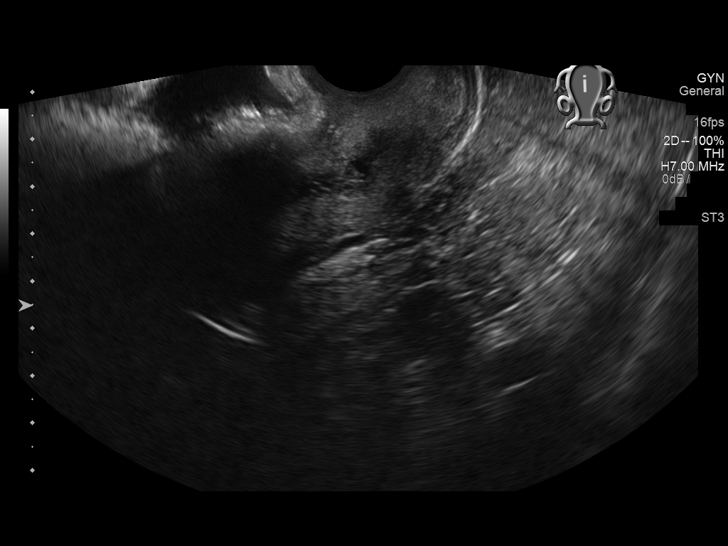
[im 35/39]
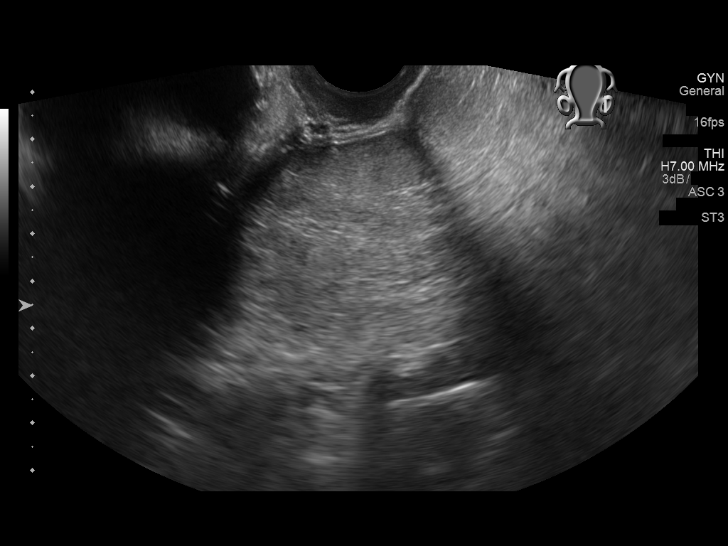
[im 39/39]
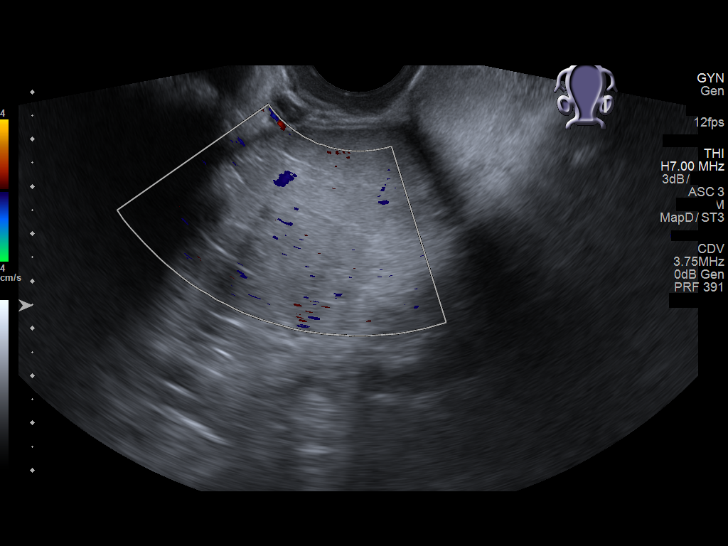

[13 of 25 positions shown; findings below may reference images not displayed]

FINDINGS: Evaluation is very limited due to patient's body habitus.

Uterus

Measurements: 7.5 x 3.9 x 3.3 cm. The uterus is anteverted. The
uterus is suboptimally visualized and with limited evaluation.

Endometrium

Thickness: 3 mm. The endometrium is poorly visualized and
suboptimally evaluated.

Right ovary

Not visualized

Left ovary

The left ovary is not visualized. There is a 5.8 x 4.5 x 4.6 cm
echogenic structure in the region of the left adnexa likely
corresponding to the lesion seen on the earlier CT. This is not
characterized but may represent a dermoid or less likely an
endometrioma. Other etiologies are not excluded. Further
characterization with MRI without and with contrast is recommended.

Other findings

No abnormal free fluid.
IMPRESSION: Echogenic lesion in the left adnexa may represent a dermoid, or
endometrioma. Other etiologies are not excluded. Further evaluation
with MRI is recommended.

## 2020-02-21 DIAGNOSIS — I5020 Unspecified systolic (congestive) heart failure: Secondary | ICD-10-CM | POA: Insufficient documentation

## 2020-02-25 NOTE — Telephone Encounter (Signed)
Deborah [name] spoke with patient reschedule for follow up call with you on 03/11/2020

## 2020-02-25 NOTE — Chronic Care Management (AMB) (Signed)
Error on date.

## 2020-02-25 NOTE — Chronic Care Management (AMB) (Addendum)
  Care Management   Note  02/25/2020 Name: Deborah [name] MRN: [id number] DOB: [birth year]  Deborah [name] is a 54 y.o. year old female who is a primary care patient of [place], Bernita Raisin, DO and is actively engaged with the care management team. I reached out to Deborah [name] by phone today to assist with re-scheduling a follow up visit with the RN Case Manager  Follow up plan: Telephone appointment with care management team member scheduled for:03/11/2020  [name] Management  Direct Dial: [phone]

## 2020-02-26 ENCOUNTER — Encounter: Payer: Self-pay | Admitting: Physical Medicine and Rehabilitation

## 2020-02-26 ENCOUNTER — Ambulatory Visit: Payer: Self-pay | Admitting: Licensed Clinical Social Worker

## 2020-02-26 ENCOUNTER — Encounter
Payer: Medicare Other | Attending: Physical Medicine and Rehabilitation | Admitting: Physical Medicine and Rehabilitation

## 2020-02-26 ENCOUNTER — Other Ambulatory Visit: Payer: Self-pay

## 2020-02-26 DIAGNOSIS — G894 Chronic pain syndrome: Secondary | ICD-10-CM | POA: Diagnosis not present

## 2020-02-26 DIAGNOSIS — C7A010 Malignant carcinoid tumor of the duodenum: Secondary | ICD-10-CM

## 2020-02-26 DIAGNOSIS — R5381 Other malaise: Secondary | ICD-10-CM

## 2020-02-26 DIAGNOSIS — G893 Neoplasm related pain (acute) (chronic): Secondary | ICD-10-CM | POA: Insufficient documentation

## 2020-02-26 MED ORDER — ONDANSETRON HCL 4 MG PO TABS
4.0000 mg | ORAL_TABLET | Freq: Four times a day (QID) | ORAL | 0 refills | Status: DC | PRN
Start: 2020-02-26 — End: 2020-03-30

## 2020-02-26 MED ORDER — HYDROMORPHONE HCL 2 MG PO TABS: 2.0000 mg | ORAL_TABLET | Freq: Every day | ORAL | 0 refills | Status: DC | PRN

## 2020-02-26 NOTE — Progress Notes (Signed)
Subjective:    Patient ID: Deborah [name], female    DOB: [birth year], 54 y.o.   MRN: [id number]  HPI  Due to national recommendations of social distancing because of COVID 55, an audio/video tele-health visit is felt to be the most appropriate encounter for this patient at this time. See MyChart message from today for the patient's consent to a tele-health encounter with [facility]. This is a follow up tele-visit via phone The patient is at home. MD is at office.   Deborah [name] is a 54 year old woman who presents for follow-up of severe cancer-related abdominal pain.  She missed multiple previous appointments due to requiring hospitalization and has been out of her pain medication, of which she requests a refill today.  She was early for her in-person appointment today, but unfortunately developed emesis and thus had to be seen over phone. She said the emesis was only present today.   Pain is 10/10 right now and on average and feels constant and sharp.   Pain affects her general activity, relation with others, and enjoyment of life severely.  Pain Inventory Average Pain 10 Pain Right Now 10 My pain is constant and sharp  In the last 24 hours, has pain interfered with the following? General activity 10 Relation with others 10 Enjoyment of life 10 What TIME of day is your pain at its worst? morning , daytime, evening and night Sleep (in general) Poor  Pain is worse with: walking, bending, sitting, standing and some activites Pain improves with: medication Relief from Meds: 10  Family History  Problem Relation Age of Onset  . Diabetes Mother   . Diabetes Father   . Heart disease Father   . Diabetes Sister   . Congestive Heart Failure Sister 3  . Diabetes Brother    Social History   Socioeconomic History  . Marital status: Single    Spouse name: Not on file  . Number of children: Not on file  . Years of education: Not on file   . Highest education level: Not on file  Occupational History  . Not on file  Tobacco Use  . Smoking status: Never Smoker  . Smokeless tobacco: Never Used  Vaping Use  . Vaping Use: Never used  Substance and Sexual Activity  . Alcohol use: No  . Drug use: No  . Sexual activity: Not Currently    Birth control/protection: None  Other Topics Concern  . Not on file  Social History Narrative   ** Merged History Encounter **       Social Determinants of Health   Financial Resource Strain: Not on file  Food Insecurity: Not on file  Transportation Needs: Not on file  Physical Activity: Not on file  Stress: Not on file  Social Connections: Not on file   Past Surgical History:  Procedure Laterality Date  . BIOPSY  07/27/2019   Procedure: BIOPSY;  Surgeon: Clarene Essex, MD;  Location: [facility];  Service: Endoscopy;;  . BIOPSY  07/30/2019   Procedure: BIOPSY;  Surgeon: Otis Brace, MD;  Location: [facility];  Service: Gastroenterology;;  . CATARACT EXTRACTION  01/2014  . CHOLECYSTECTOMY    . COLONOSCOPY WITH PROPOFOL N/A 07/30/2019   Procedure: COLONOSCOPY WITH PROPOFOL;  Surgeon: Otis Brace, MD;  Location: [facility];  Service: Gastroenterology;  Laterality: N/A;  . ESOPHAGOGASTRODUODENOSCOPY N/A 07/27/2019   Procedure: ESOPHAGOGASTRODUODENOSCOPY (EGD);  Surgeon: Clarene Essex, MD;  Location: [facility];  Service: Endoscopy;  Laterality: N/A;  . ESOPHAGOGASTRODUODENOSCOPY (EGD) WITH PROPOFOL N/A 08/02/2019   Procedure: ESOPHAGOGASTRODUODENOSCOPY (EGD) WITH PROPOFOL;  Surgeon: Otis Brace, MD;  Location: [facility];  Service: Gastroenterology;  Laterality: N/A;  . HEMOSTASIS CLIP PLACEMENT  08/02/2019   Procedure: HEMOSTASIS CLIP PLACEMENT;  Surgeon: Otis Brace, MD;  Location: [facility];  Service: Gastroenterology;;  . POLYPECTOMY  07/30/2019   Procedure: POLYPECTOMY;  Surgeon: Otis Brace, MD;  Location: [facility];  Service:  Gastroenterology;;  . POLYPECTOMY  08/02/2019   Procedure: POLYPECTOMY;  Surgeon: Otis Brace, MD;  Location: [facility];  Service: Gastroenterology;;   Past Surgical History:  Procedure Laterality Date  . BIOPSY  07/27/2019   Procedure: BIOPSY;  Surgeon: Clarene Essex, MD;  Location: [facility];  Service: Endoscopy;;  . BIOPSY  07/30/2019   Procedure: BIOPSY;  Surgeon: Otis Brace, MD;  Location: [facility];  Service: Gastroenterology;;  . CATARACT EXTRACTION  01/2014  . CHOLECYSTECTOMY    . COLONOSCOPY WITH PROPOFOL N/A 07/30/2019   Procedure: COLONOSCOPY WITH PROPOFOL;  Surgeon: Otis Brace, MD;  Location: [facility];  Service: Gastroenterology;  Laterality: N/A;  . ESOPHAGOGASTRODUODENOSCOPY N/A 07/27/2019   Procedure: ESOPHAGOGASTRODUODENOSCOPY (EGD);  Surgeon: Clarene Essex, MD;  Location: [facility];  Service: Endoscopy;  Laterality: N/A;  . ESOPHAGOGASTRODUODENOSCOPY (EGD) WITH PROPOFOL N/A 08/02/2019   Procedure: ESOPHAGOGASTRODUODENOSCOPY (EGD) WITH PROPOFOL;  Surgeon: Otis Brace, MD;  Location: [facility];  Service: Gastroenterology;  Laterality: N/A;  . HEMOSTASIS CLIP PLACEMENT  08/02/2019   Procedure: HEMOSTASIS CLIP PLACEMENT;  Surgeon: Otis Brace, MD;  Location: [facility];  Service: Gastroenterology;;  . POLYPECTOMY  07/30/2019   Procedure: POLYPECTOMY;  Surgeon: Otis Brace, MD;  Location: [facility];  Service: Gastroenterology;;  . POLYPECTOMY  08/02/2019   Procedure: POLYPECTOMY;  Surgeon: Otis Brace, MD;  Location: [facility];  Service: Gastroenterology;;   Past Medical History:  Diagnosis Date  . Acute back pain with sciatica, left   . Acute back pain with sciatica, right   . AKI (acute kidney injury) ([place])   . Anemia, unspecified   . Cancer ([place])   . Carcinoid tumor of duodenum   . Chronic pain   . Chronic systolic CHF (congestive heart failure) ([place])   . Diabetes mellitus   . Esophageal reflux   .  Fibromyalgia   . Gastric ulcer   . Gastroparesis   . Gout   . Hyperlipidemia   . Hypertension   . Lumbosacral stenosis   . NICM (nonischemic cardiomyopathy) ([place])   . Obesity   . PAF (paroxysmal atrial fibrillation) ([place])   . Stroke ([place]) 02/2011  . Vitamin B12 deficiency anemia    LMP 10/10/2012   Opioid Risk Score:   Fall Risk Score:  `1  Depression screen PHQ 2/9  Depression screen [facility] 2/9 01/04/2020 11/15/2019 11/14/2019 10/10/2019 09/20/2019 09/03/2019 06/04/2019  Decreased Interest 2 3 1 3 3  0 3  Down, Depressed, Hopeless 3 3 1 3 3 3 3   PHQ - 2 Score 5 6 2 6 6 3 6   Altered sleeping 3 3 - 3 3 - -  Tired, decreased energy 3 3 - 3 3 - -  Change in appetite 3 3 - 3 3 - -  Feeling bad or failure about yourself  3 3 - 2 2 - -  Trouble concentrating 3 3 - 3 3 - -  Moving slowly or fidgety/restless 3 3 - 3 1 - -  Suicidal thoughts 0 3 - 0 1 - -  PHQ-9 Score 23 27 -  23 - - -  Difficult doing work/chores - - - - Very difficult - -  Some recent data might be hidden    Review of Systems  Gastrointestinal: Positive for abdominal pain.  All other systems reviewed and are negative.      Objective:   Physical Exam Patient was vomiting in bathroom and visit was switched to phone visit as per our department's policy     Assessment & Plan:  Deborah [name] is a 54 year old woman who presents for follow-up of the following medical conditions:  1) Chronic Pain Syndrome secondary to malignant carcinoid tumor of the duodenum.  -Discussed current symptoms of pain, severity, and history of pain.  -PDMP reviewed  -Refill of Dilaudid 2mg  PRN daily for severe cancer-related pain prescribed.  2) Emesis secondary to malignant carcinoid tumor of the duodenum.  -Zofran prescribed -Recommended that patient may benefit from going to the ED for imaging/symptom management. Patient defers at this time  10 minutes spent in discussion of abdominal pain, pain and emeis medications, sending refills,  discussion of emesis, further work-up, further follow-up  .

## 2020-02-26 NOTE — Chronic Care Management (AMB) (Signed)
Care Management   Clinical Social Work Follow UpNote  02/26/2020 Name: Deborah [name] MRN: [id number] DOB: 1966/01/05 Deborah [name] is a 54 y.o. year old female who sees Meccariello, Bernita Raisin, DO for primary care.  Patient is enrolled in a Managed Medicaid plan: No.  The Care Management team was consulted to assist the patient with Level of Care Concerns for personal care services. F/U call to assess ongoing PCS needs since being discharged from hospital.   LCSW attempted to engaged with Deborah [name] today by telephone in response to provider referral for social work case management and/or care coordination services. Patient reports she is in pain and unable to speak with LCSW at this time.  Assessed for need to connect to provider ( patient declined) reminded patient to call 911 if life threaten.    Follow up Plan:CCM RN has a scheduled phone appointment with patient in Jan.  LCSW will continue to collaborate for ongoing needs and f/u with patient as needed after contact made by CCM RN   Outpatient Encounter Medications as of 02/26/2020  Medication Sig Note  . albuterol (PROVENTIL) (2.5 MG/3ML) 0.083% nebulizer solution Take 3 mLs (2.5 mg total) by nebulization every 6 (six) hours as needed for wheezing or shortness of breath. 01/23/2020: LF 09/14  . allopurinol (ZYLOPRIM) 100 MG tablet Take 100 mg by mouth daily.   [name] ALPRAZolam (XANAX) 1 MG tablet Take 1 tablet (1 mg total) by mouth 2 (two) times daily as needed for anxiety. 01/23/2020: LF 6/28  . atorvastatin (LIPITOR) 10 MG tablet TAKE ONE TABLET BY MOUTH ONCE DAILY (AM) (Patient taking differently: Take 10 mg by mouth daily. ) 01/23/2020: LF 10/14  . blood glucose meter kit and supplies KIT Dispense based on patient and insurance preference. Use up to four times daily as directed. (FOR ICD-9 250.00, 250.01).   . cetirizine (ZYRTEC) 10 MG tablet TAKE ONE TABLET BY MOUTH ONCE DAILY (AM) (Patient taking differently: Take 10  mg by mouth daily. ) 01/23/2020: LF 10/14  . colchicine 0.6 MG tablet Take 2 tablets at 1 time (1.2 mg total) and 1 hour later take 1 tablet (Patient taking differently: Take 0.6 mg by mouth See admin instructions. Take 2 tablets (1.85m totally) by mouth at 1 time and 1 hour later take 1 tablet (0.6 mg totally) as needed for gout flares) 01/23/2020: LF 10/29  . DULoxetine HCl 40 MG CPEP Take 40 mg by mouth in the morning and at bedtime.   .Marland KitchenEASY COMFORT PEN NEEDLES 31G X 5 MM MISC USE 3 TIMES A DAY FOR INSULIN ADMINISTRATION   . ELIQUIS 5 MG TABS tablet TAKE 1 TABLET BY MOUTH 2 (TWO) TIMES DAILY. (Patient taking differently: Take 5 mg by mouth 2 (two) times daily. ) 01/23/2020: LF 10/29  . FARXIGA 10 MG TABS tablet Take 10 mg by mouth daily.   . ferrous sulfate 325 (65 FE) MG tablet Take 1 tablet (325 mg total) by mouth daily with breakfast. 01/23/2020: LF 9/15  . fluticasone (FLONASE) 50 MCG/ACT nasal spray Place 2 sprays into both nostrils daily as needed for allergies or rhinitis.   . hydrALAZINE (APRESOLINE) 25 MG tablet TAKE 1 TABLET BY MOUTH 3 (THREE) TIMES DAILY. FOR BLOOD PRESSURE (Patient taking differently: Take 25 mg by mouth 3 (three) times daily. ) 01/23/2020: LF 10/14  . HYDROmorphone (DILAUDID) 2 MG tablet TAKE ONE-HALF TABLETS (1 MG TOTAL) BY MOUTH DAILY AS NEEDED FOR SEVERE PAIN. (Patient taking differently: Take 1  mg by mouth daily as needed for severe pain. ) 01/23/2020: LF 10/11, 60 day supply   . hydrOXYzine (ATARAX/VISTARIL) 10 MG tablet Take 1 tablet (10 mg total) by mouth 3 (three) times daily as needed. (Patient taking differently: Take 10 mg by mouth 3 (three) times daily as needed for itching or anxiety. ) 01/23/2020: LF 9/9  . isosorbide mononitrate (IMDUR) 60 MG 24 hr tablet TAKE 1 TABLET (60 MG TOTAL) BY MOUTH DAILY.   [name] LANTUS SOLOSTAR 100 UNIT/ML Solostar Pen Inject 32 Units into the skin at bedtime.   . melatonin 3 MG TABS tablet Take 2 tablets (6 mg total) by mouth at  bedtime.   . metoprolol succinate (TOPROL-XL) 25 MG 24 hr tablet Take 1 tablet (25 mg total) by mouth daily.   . nitroGLYCERIN (NITROSTAT) 0.4 MG SL tablet Place 1 tablet (0.4 mg total) under the tongue every 5 (five) minutes as needed for chest pain. (Patient taking differently: Place 0.4 mg under the tongue every 5 (five) minutes as needed for chest pain (max 3 doses). )   . NOVOLOG FLEXPEN 100 UNIT/ML FlexPen Inject 30 Units into the skin in the morning, at noon, in the evening, and at bedtime.   . ondansetron (ZOFRAN) 4 MG tablet Take 1 tablet (4 mg total) by mouth every 6 (six) hours as needed for nausea.   . pantoprazole (PROTONIX) 40 MG tablet TAKE ONE TABLET BY MOUTH TWICE DAILY (Patient taking differently: Take 40 mg by mouth 2 (two) times daily. )   . torsemide (DEMADEX) 20 MG tablet TAKE 2 TABLETS (40 MG TOTAL) BY MOUTH DAILY.    No facility-administered encounter medications on file as of 02/26/2020.    Review of patient status, including review of consultants reports, relevant laboratory and other test results, and collaboration with appropriate care team members and the patient's provider was performed as part of comprehensive patient evaluation and provision of chronic care management services.       Information about Care Management services was shared with [name] today including:  1. Care Management services include personalized support from designated clinical staff supervised by her physician, including individualized plan of care and coordination with other care providers 2. Remind patient of 24/7 contact phone numbers to provider's office for assistance with urgent and routine care needs. 3. Care Management services are voluntary and patient may stop at any time .   Patient agreed to services provided today and verbal consent obtained.     [name], [place] / [place]    [phone] 10:01 AM

## 2020-03-11 ENCOUNTER — Telehealth: Payer: Medicare Other

## 2020-03-11 ENCOUNTER — Ambulatory Visit: Payer: Self-pay | Admitting: Physical Medicine and Rehabilitation

## 2020-03-11 ENCOUNTER — Institutional Professional Consult (permissible substitution): Payer: Medicare Other | Admitting: Pulmonary Disease

## 2020-03-11 NOTE — Progress Notes (Deleted)
Synopsis: Referred in 03/11/2020 for hypoxia by Dr. Madison Hickman, MD  Subjective:   PATIENT ID: Deborah [name] GENDER: female DOB: 01/09/1966, MRN: [id number]   HPI  No chief complaint on file.   ***  Past Medical History:  Diagnosis Date  . Acute back pain with sciatica, left   . Acute back pain with sciatica, right   . AKI (acute kidney injury) ([place])   . Anemia, unspecified   . Cancer ([place])   . Carcinoid tumor of duodenum   . Chronic pain   . Chronic systolic CHF (congestive heart failure) ([place])   . Diabetes mellitus   . Esophageal reflux   . Fibromyalgia   . Gastric ulcer   . Gastroparesis   . Gout   . Hyperlipidemia   . Hypertension   . Lumbosacral stenosis   . NICM (nonischemic cardiomyopathy) ([place])   . Obesity   . PAF (paroxysmal atrial fibrillation) ([place])   . Stroke ([place]) 02/2011  . Vitamin B12 deficiency anemia      Family History  Problem Relation Age of Onset  . Diabetes Mother   . Diabetes Father   . Heart disease Father   . Diabetes Sister   . Congestive Heart Failure Sister 11  . Diabetes Brother      Social History   Socioeconomic History  . Marital status: Single    Spouse name: Not on file  . Number of children: Not on file  . Years of education: Not on file  . Highest education level: Not on file  Occupational History  . Not on file  Tobacco Use  . Smoking status: Never Smoker  . Smokeless tobacco: Never Used  Vaping Use  . Vaping Use: Never used  Substance and Sexual Activity  . Alcohol use: No  . Drug use: No  . Sexual activity: Not Currently    Birth control/protection: None  Other Topics Concern  . Not on file  Social History Narrative   ** Merged History Encounter **       Social Determinants of Health   Financial Resource Strain: Not on file  Food Insecurity: Not on file  Transportation Needs: Not on file  Physical Activity: Not on file  Stress: Not on file  Social Connections: Not on file  Intimate  Partner Violence: Not on file     Allergies  Allergen Reactions  . Diazepam [name]  . Isovue [Iopamidol] Anaphylaxis    11/28/17 Patient had seizure like activity and then 1 min code after 100 cc of isovue 300. Possible contrast allergy vs vasovagal episode  . Lisinopril Anaphylaxis    Tongue and mouth swelling  . Metoclopramide Other (See Comments)    Tardive dyskinesia  . Penicillins Palpitations    Has patient had a PCN reaction causing immediate rash, facial/tongue/throat swelling, SOB or lightheadedness with hypotension: Yes, heart races Has patient had a PCN reaction causing severe rash involving mucus membranes or skin necrosis: No Has patient had a PCN reaction that required hospitalization: Yes  Has patient had a PCN reaction occurring within the last 10 years: No   . Tolmetin Nausea Only and Other (See Comments)    ULCER  . Aspirin Other (See Comments)    Irritates stomach ulcer   . Nsaids Other (See Comments)    ULCER  . Acetaminophen Nausea Only and Other (See Comments)    Irritates stomach ulcer; Abdominal pain  . Tramadol Nausea And Vomiting     Outpatient Medications  Prior to Visit  Medication Sig Dispense Refill  . albuterol (PROVENTIL) (2.5 MG/3ML) 0.083% nebulizer solution Take 3 mLs (2.5 mg total) by nebulization every 6 (six) hours as needed for wheezing or shortness of breath. 150 mL 1  . allopurinol (ZYLOPRIM) 100 MG tablet Take 100 mg by mouth daily.    [name] ALPRAZolam (XANAX) 1 MG tablet Take 1 tablet (1 mg total) by mouth 2 (two) times daily as needed for anxiety. 60 tablet 0  . atorvastatin (LIPITOR) 10 MG tablet TAKE ONE TABLET BY MOUTH ONCE DAILY (AM) (Patient taking differently: Take 10 mg by mouth daily.) 30 tablet 11  . blood glucose meter kit and supplies KIT Dispense based on patient and insurance preference. Use up to four times daily as directed. (FOR ICD-9 250.00, 250.01). 1 each 0  . cetirizine (ZYRTEC) 10 MG tablet TAKE ONE TABLET BY  MOUTH ONCE DAILY (AM) (Patient taking differently: Take 10 mg by mouth daily.) 30 tablet 11  . colchicine 0.6 MG tablet Take 2 tablets at 1 time (1.2 mg total) and 1 hour later take 1 tablet (Patient taking differently: Take 0.6 mg by mouth See admin instructions. Take 2 tablets (1.57m totally) by mouth at 1 time and 1 hour later take 1 tablet (0.6 mg totally) as needed for gout flares) 3 tablet 0  . DULoxetine HCl 40 MG CPEP Take 40 mg by mouth in the morning and at bedtime.    .Marland KitchenEASY COMFORT PEN NEEDLES 31G X 5 MM MISC USE 3 TIMES A DAY FOR INSULIN ADMINISTRATION 100 each 3  . ELIQUIS 5 MG TABS tablet TAKE 1 TABLET BY MOUTH 2 (TWO) TIMES DAILY. (Patient taking differently: Take 5 mg by mouth 2 (two) times daily.) 60 tablet 2  . FARXIGA 10 MG TABS tablet Take 10 mg by mouth daily.    . ferrous sulfate 325 (65 FE) MG tablet Take 1 tablet (325 mg total) by mouth daily with breakfast. 30 tablet 0  . fluticasone (FLONASE) 50 MCG/ACT nasal spray Place 2 sprays into both nostrils daily as needed for allergies or rhinitis. 16 g 2  . hydrALAZINE (APRESOLINE) 25 MG tablet TAKE 1 TABLET BY MOUTH 3 (THREE) TIMES DAILY. FOR BLOOD PRESSURE (Patient taking differently: Take 25 mg by mouth 3 (three) times daily.) 90 tablet 2  . HYDROmorphone (DILAUDID) 2 MG tablet Take 1 tablet (2 mg total) by mouth daily as needed for severe pain. 30 tablet 0  . hydrOXYzine (ATARAX/VISTARIL) 10 MG tablet Take 1 tablet (10 mg total) by mouth 3 (three) times daily as needed. (Patient taking differently: Take 10 mg by mouth 3 (three) times daily as needed for itching or anxiety.) 30 tablet 0  . isosorbide mononitrate (IMDUR) 60 MG 24 hr tablet TAKE 1 TABLET (60 MG TOTAL) BY MOUTH DAILY. 30 tablet 1  . LANTUS SOLOSTAR 100 UNIT/ML Solostar Pen Inject 32 Units into the skin at bedtime. 15 mL 0  . melatonin 3 MG TABS tablet Take 2 tablets (6 mg total) by mouth at bedtime. 60 tablet 2  . metoprolol succinate (TOPROL-XL) 25 MG 24 hr tablet  Take 1 tablet (25 mg total) by mouth daily. 30 tablet 3  . nitroGLYCERIN (NITROSTAT) 0.4 MG SL tablet Place 1 tablet (0.4 mg total) under the tongue every 5 (five) minutes as needed for chest pain. (Patient taking differently: Place 0.4 mg under the tongue every 5 (five) minutes as needed for chest pain (max 3 doses).) 30 tablet 1  . NOVOLOG  FLEXPEN 100 UNIT/ML FlexPen Inject 30 Units into the skin in the morning, at noon, in the evening, and at bedtime. 40 mL 3  . ondansetron (ZOFRAN) 4 MG tablet Take 1 tablet (4 mg total) by mouth every 6 (six) hours as needed for nausea. 20 tablet 0  . pantoprazole (PROTONIX) 40 MG tablet TAKE ONE TABLET BY MOUTH TWICE DAILY (Patient taking differently: Take 40 mg by mouth 2 (two) times daily.) 60 tablet 3  . torsemide (DEMADEX) 20 MG tablet TAKE 2 TABLETS (40 MG TOTAL) BY MOUTH DAILY. 60 tablet 0   No facility-administered medications prior to visit.    ROS    Objective:  There were no vitals filed for this visit.   Physical Exam    CBC    Component Value Date/Time   WBC 9.3 01/28/2020 0416   RBC 2.98 (L) 01/28/2020 0416   HGB 8.3 (L) 01/28/2020 0416   HGB 8.8 (L) 10/09/2019 0906   HGB 9.6 (L) 09/03/2019 1620   HCT 27.6 (L) 01/28/2020 0416   HCT 30.4 (L) 09/03/2019 1620   PLT 346 01/28/2020 0416   PLT 348 10/09/2019 0906   PLT 451 (H) 09/03/2019 1620   MCV 92.6 01/28/2020 0416   MCV 85 09/03/2019 1620   MCH 27.9 01/28/2020 0416   MCHC 30.1 01/28/2020 0416   RDW 16.0 (H) 01/28/2020 0416   RDW 13.6 09/03/2019 1620   LYMPHSABS 3.1 01/28/2020 0416   LYMPHSABS 2.2 09/03/2019 1620   MONOABS 0.5 01/28/2020 0416   EOSABS 0.5 01/28/2020 0416   EOSABS 0.3 09/03/2019 1620   BASOSABS 0.1 01/28/2020 0416   BASOSABS 0.1 09/03/2019 1620     Chest imaging:  PFT: No flowsheet data found.  Labs:  Path:  Echo:  Heart Catheterization:       Assessment & Plan:   No diagnosis found.  Discussion: ***    Current Outpatient  Medications:  .  albuterol (PROVENTIL) (2.5 MG/3ML) 0.083% nebulizer solution, Take 3 mLs (2.5 mg total) by nebulization every 6 (six) hours as needed for wheezing or shortness of breath., Disp: 150 mL, Rfl: 1 .  allopurinol (ZYLOPRIM) 100 MG tablet, Take 100 mg by mouth daily., Disp: , Rfl:  .  ALPRAZolam (XANAX) 1 MG tablet, Take 1 tablet (1 mg total) by mouth 2 (two) times daily as needed for anxiety., Disp: 60 tablet, Rfl: 0 .  atorvastatin (LIPITOR) 10 MG tablet, TAKE ONE TABLET BY MOUTH ONCE DAILY (AM) (Patient taking differently: Take 10 mg by mouth daily.), Disp: 30 tablet, Rfl: 11 .  blood glucose meter kit and supplies KIT, Dispense based on patient and insurance preference. Use up to four times daily as directed. (FOR ICD-9 250.00, 250.01)., Disp: 1 each, Rfl: 0 .  cetirizine (ZYRTEC) 10 MG tablet, TAKE ONE TABLET BY MOUTH ONCE DAILY (AM) (Patient taking differently: Take 10 mg by mouth daily.), Disp: 30 tablet, Rfl: 11 .  colchicine 0.6 MG tablet, Take 2 tablets at 1 time (1.2 mg total) and 1 hour later take 1 tablet (Patient taking differently: Take 0.6 mg by mouth See admin instructions. Take 2 tablets (1.52m totally) by mouth at 1 time and 1 hour later take 1 tablet (0.6 mg totally) as needed for gout flares), Disp: 3 tablet, Rfl: 0 .  DULoxetine HCl 40 MG CPEP, Take 40 mg by mouth in the morning and at bedtime., Disp: , Rfl:  .  EASY COMFORT PEN NEEDLES 31G X 5 MM MISC, USE 3 TIMES A DAY  FOR INSULIN ADMINISTRATION, Disp: 100 each, Rfl: 3 .  ELIQUIS 5 MG TABS tablet, TAKE 1 TABLET BY MOUTH 2 (TWO) TIMES DAILY. (Patient taking differently: Take 5 mg by mouth 2 (two) times daily.), Disp: 60 tablet, Rfl: 2 .  FARXIGA 10 MG TABS tablet, Take 10 mg by mouth daily., Disp: , Rfl:  .  ferrous sulfate 325 (65 FE) MG tablet, Take 1 tablet (325 mg total) by mouth daily with breakfast., Disp: 30 tablet, Rfl: 0 .  fluticasone (FLONASE) 50 MCG/ACT nasal spray, Place 2 sprays into both nostrils daily as  needed for allergies or rhinitis., Disp: 16 g, Rfl: 2 .  hydrALAZINE (APRESOLINE) 25 MG tablet, TAKE 1 TABLET BY MOUTH 3 (THREE) TIMES DAILY. FOR BLOOD PRESSURE (Patient taking differently: Take 25 mg by mouth 3 (three) times daily.), Disp: 90 tablet, Rfl: 2 .  HYDROmorphone (DILAUDID) 2 MG tablet, Take 1 tablet (2 mg total) by mouth daily as needed for severe pain., Disp: 30 tablet, Rfl: 0 .  hydrOXYzine (ATARAX/VISTARIL) 10 MG tablet, Take 1 tablet (10 mg total) by mouth 3 (three) times daily as needed. (Patient taking differently: Take 10 mg by mouth 3 (three) times daily as needed for itching or anxiety.), Disp: 30 tablet, Rfl: 0 .  isosorbide mononitrate (IMDUR) 60 MG 24 hr tablet, TAKE 1 TABLET (60 MG TOTAL) BY MOUTH DAILY., Disp: 30 tablet, Rfl: 1 .  LANTUS SOLOSTAR 100 UNIT/ML Solostar Pen, Inject 32 Units into the skin at bedtime., Disp: 15 mL, Rfl: 0 .  melatonin 3 MG TABS tablet, Take 2 tablets (6 mg total) by mouth at bedtime., Disp: 60 tablet, Rfl: 2 .  metoprolol succinate (TOPROL-XL) 25 MG 24 hr tablet, Take 1 tablet (25 mg total) by mouth daily., Disp: 30 tablet, Rfl: 3 .  nitroGLYCERIN (NITROSTAT) 0.4 MG SL tablet, Place 1 tablet (0.4 mg total) under the tongue every 5 (five) minutes as needed for chest pain. (Patient taking differently: Place 0.4 mg under the tongue every 5 (five) minutes as needed for chest pain (max 3 doses).), Disp: 30 tablet, Rfl: 1 .  NOVOLOG FLEXPEN 100 UNIT/ML FlexPen, Inject 30 Units into the skin in the morning, at noon, in the evening, and at bedtime., Disp: 40 mL, Rfl: 3 .  ondansetron (ZOFRAN) 4 MG tablet, Take 1 tablet (4 mg total) by mouth every 6 (six) hours as needed for nausea., Disp: 20 tablet, Rfl: 0 .  pantoprazole (PROTONIX) 40 MG tablet, TAKE ONE TABLET BY MOUTH TWICE DAILY (Patient taking differently: Take 40 mg by mouth 2 (two) times daily.), Disp: 60 tablet, Rfl: 3 .  torsemide (DEMADEX) 20 MG tablet, TAKE 2 TABLETS (40 MG TOTAL) BY MOUTH  DAILY., Disp: 60 tablet, Rfl: 0

## 2020-03-18 ENCOUNTER — Ambulatory Visit: Payer: Self-pay | Admitting: Licensed Clinical Social Worker

## 2020-03-18 ENCOUNTER — Telehealth: Payer: Self-pay | Admitting: *Deleted

## 2020-03-18 NOTE — Chronic Care Management (AMB) (Signed)
  Care Management  Clinical Social Work Note  03/18/2020 Name: Deborah [name] MRN: [id number] DOB: 04/14/1965  Deborah [name] is a 55 y.o. year old female who is a primary care patient of Cleophas Dunker, DO. The CCM team was consulted for assistance with chronic disease management and care coordination needs.  CCM RN nor CCM LCSW have been able to provided services to patient since her discharge from the hospital.   Recommendation: After collaboration with CCM RN it is determined that patient may benefit from CCM care team reaching out to patient to see if she would like ongoing support from CCM team and to schedule another appointment. Intervention: Patient was not interviewed or contacted during this encounter.  LCSW collaborated with CCM RN..  Plan: LCSW will ask CCM Care Guide to contact patient to schedule appointment with CCM RN  [name], [place] / [place]   [phone] 2:59 PM

## 2020-03-18 NOTE — Chronic Care Management (AMB) (Signed)
  Care Management   Note  03/18/2020 Name: Deborah [name] MRN: [id number] DOB: 09-28-1965  Deborah [name] is a 55 y.o. year old female who is a primary care patient of [place], Bernita Raisin, DO and is actively engaged with the care management team. I reached out to Deborah [name] by phone today to assist with re-scheduling a follow up visit with the RN Case Manager  Follow up plan: Unsuccessful telephone outreach attempt made. A HIPAA compliant phone message was left for the patient providing contact information and requesting a return call. The care management team will reach out to the patient again over the next 7 days. If patient returns call to provider office, please advise to call [facility] at [phone].  [place] Management

## 2020-03-25 NOTE — Chronic Care Management (AMB) (Signed)
  Care Management   Note  03/25/2020 Name: Deborah [name] MRN: [id number] DOB: [birth year]  Deborah [name] is a 55 y.o. year old female who is a primary care patient of [place], Bernita Raisin, DO and is actively engaged with the care management team. I reached out to Deborah [name] by phone today to assist with re-scheduling a follow up visit with the RN Case Manager  Follow up plan: Unsuccessful telephone outreach attempt made. A HIPAA compliant phone message was left for the patient providing contact information and requesting a return call.  The care management team will reach out to the patient again over the next 7 days.  If patient returns call to provider office, please advise to call [facility] at [phone].  [place] Management

## 2020-03-28 ENCOUNTER — Inpatient Hospital Stay (HOSPITAL_COMMUNITY)
Admission: EM | Admit: 2020-03-28 | Discharge: 2020-03-30 | DRG: 074 | Disposition: A | Discharge status: Home / Self Care | Payer: Medicare Other | Attending: Family Medicine | Admitting: Family Medicine

## 2020-03-28 ENCOUNTER — Other Ambulatory Visit: Payer: Self-pay

## 2020-03-28 ENCOUNTER — Encounter (HOSPITAL_COMMUNITY): Payer: Self-pay | Admitting: Emergency Medicine

## 2020-03-28 DIAGNOSIS — Z8679 Personal history of other diseases of the circulatory system: Secondary | ICD-10-CM

## 2020-03-28 DIAGNOSIS — M4807 Spinal stenosis, lumbosacral region: Secondary | ICD-10-CM | POA: Diagnosis present

## 2020-03-28 DIAGNOSIS — Z8673 Personal history of transient ischemic attack (TIA), and cerebral infarction without residual deficits: Secondary | ICD-10-CM

## 2020-03-28 DIAGNOSIS — N1832 Chronic kidney disease, stage 3b: Secondary | ICD-10-CM | POA: Diagnosis present

## 2020-03-28 DIAGNOSIS — Z888 Allergy status to other drugs, medicaments and biological substances status: Secondary | ICD-10-CM

## 2020-03-28 DIAGNOSIS — Z8249 Family history of ischemic heart disease and other diseases of the circulatory system: Secondary | ICD-10-CM

## 2020-03-28 DIAGNOSIS — Z7901 Long term (current) use of anticoagulants: Secondary | ICD-10-CM

## 2020-03-28 DIAGNOSIS — Z833 Family history of diabetes mellitus: Secondary | ICD-10-CM

## 2020-03-28 DIAGNOSIS — E1122 Type 2 diabetes mellitus with diabetic chronic kidney disease: Secondary | ICD-10-CM | POA: Diagnosis present

## 2020-03-28 DIAGNOSIS — Z20822 Contact with and (suspected) exposure to covid-19: Secondary | ICD-10-CM | POA: Diagnosis present

## 2020-03-28 DIAGNOSIS — M109 Gout, unspecified: Secondary | ICD-10-CM | POA: Diagnosis present

## 2020-03-28 DIAGNOSIS — I5022 Chronic systolic (congestive) heart failure: Secondary | ICD-10-CM | POA: Diagnosis present

## 2020-03-28 DIAGNOSIS — Z794 Long term (current) use of insulin: Secondary | ICD-10-CM

## 2020-03-28 DIAGNOSIS — Z79899 Other long term (current) drug therapy: Secondary | ICD-10-CM

## 2020-03-28 DIAGNOSIS — K3184 Gastroparesis: Secondary | ICD-10-CM | POA: Diagnosis present

## 2020-03-28 DIAGNOSIS — Z886 Allergy status to analgesic agent status: Secondary | ICD-10-CM

## 2020-03-28 DIAGNOSIS — M797 Fibromyalgia: Secondary | ICD-10-CM | POA: Diagnosis present

## 2020-03-28 DIAGNOSIS — I480 Paroxysmal atrial fibrillation: Secondary | ICD-10-CM | POA: Diagnosis present

## 2020-03-28 DIAGNOSIS — E1143 Type 2 diabetes mellitus with diabetic autonomic (poly)neuropathy: Principal | ICD-10-CM | POA: Diagnosis present

## 2020-03-28 DIAGNOSIS — E785 Hyperlipidemia, unspecified: Secondary | ICD-10-CM | POA: Diagnosis present

## 2020-03-28 DIAGNOSIS — Z880 Allergy status to penicillin: Secondary | ICD-10-CM

## 2020-03-28 DIAGNOSIS — R112 Nausea with vomiting, unspecified: Secondary | ICD-10-CM | POA: Diagnosis present

## 2020-03-28 DIAGNOSIS — E1165 Type 2 diabetes mellitus with hyperglycemia: Secondary | ICD-10-CM | POA: Diagnosis present

## 2020-03-28 DIAGNOSIS — I130 Hypertensive heart and chronic kidney disease with heart failure and stage 1 through stage 4 chronic kidney disease, or unspecified chronic kidney disease: Secondary | ICD-10-CM | POA: Diagnosis present

## 2020-03-28 DIAGNOSIS — D631 Anemia in chronic kidney disease: Secondary | ICD-10-CM | POA: Diagnosis present

## 2020-03-28 DIAGNOSIS — K219 Gastro-esophageal reflux disease without esophagitis: Secondary | ICD-10-CM | POA: Diagnosis present

## 2020-03-28 DIAGNOSIS — Z6841 Body Mass Index (BMI) 40.0 and over, adult: Secondary | ICD-10-CM

## 2020-03-28 DIAGNOSIS — Z9049 Acquired absence of other specified parts of digestive tract: Secondary | ICD-10-CM

## 2020-03-28 DIAGNOSIS — D490 Neoplasm of unspecified behavior of digestive system: Secondary | ICD-10-CM | POA: Diagnosis present

## 2020-03-28 LAB — COMPREHENSIVE METABOLIC PANEL
ALT: 71 U/L — ABNORMAL HIGH (ref 0–44)
AST: 53 U/L — ABNORMAL HIGH (ref 15–41)
Albumin: 3.2 g/dL — ABNORMAL LOW (ref 3.5–5.0)
Alkaline Phosphatase: 112 U/L (ref 38–126)
Anion gap: 13 (ref 5–15)
BUN: 18 mg/dL (ref 6–20)
CO2: 23 mmol/L (ref 22–32)
Calcium: 8.8 mg/dL — ABNORMAL LOW (ref 8.9–10.3)
Chloride: 100 mmol/L (ref 98–111)
Creatinine, Ser: 1.64 mg/dL — ABNORMAL HIGH (ref 0.44–1.00)
GFR, Estimated: 37 mL/min — ABNORMAL LOW (ref >60)
Glucose, Bld: 427 mg/dL — ABNORMAL HIGH (ref 70–99)
Potassium: 4.3 mmol/L (ref 3.5–5.1)
Sodium: 136 mmol/L (ref 135–145)
Total Bilirubin: 1.1 mg/dL (ref 0.3–1.2)
Total Protein: 7.6 g/dL (ref 6.5–8.1)

## 2020-03-28 LAB — CBC WITH DIFFERENTIAL/PLATELET
Abs Immature Granulocytes: 0.05 10*3/uL (ref 0.00–0.07)
Basophils Absolute: 0.1 10*3/uL (ref 0.0–0.1)
Basophils Relative: 0 %
Eosinophils Absolute: 0 10*3/uL (ref 0.0–0.5)
Eosinophils Relative: 0 %
HCT: 31.5 % — ABNORMAL LOW (ref 36.0–46.0)
Hemoglobin: 9.7 g/dL — ABNORMAL LOW (ref 12.0–15.0)
Immature Granulocytes: 0 %
Lymphocytes Relative: 12 %
Lymphs Abs: 1.4 10*3/uL (ref 0.7–4.0)
MCH: 27.8 pg (ref 26.0–34.0)
MCHC: 30.8 g/dL (ref 30.0–36.0)
MCV: 90.3 fL (ref 80.0–100.0)
Monocytes Absolute: 0.3 10*3/uL (ref 0.1–1.0)
Monocytes Relative: 3 %
Neutro Abs: 9.6 10*3/uL — ABNORMAL HIGH (ref 1.7–7.7)
Neutrophils Relative %: 85 %
Platelets: 411 10*3/uL — ABNORMAL HIGH (ref 150–400)
RBC: 3.49 MIL/uL — ABNORMAL LOW (ref 3.87–5.11)
RDW: 14.6 % (ref 11.5–15.5)
WBC: 11.4 10*3/uL — ABNORMAL HIGH (ref 4.0–10.5)
nRBC: 0 % (ref 0.0–0.2)

## 2020-03-28 LAB — BLOOD GAS, VENOUS
Acid-base deficit: 0.3 mmol/L (ref 0.0–2.0)
Bicarbonate: 24.8 mmol/L (ref 20.0–28.0)
FIO2: 21
O2 Saturation: 38.9 %
Patient temperature: 98.6
pCO2, Ven: 45.6 mmHg (ref 44.0–60.0)
pH, Ven: 7.355 (ref 7.250–7.430)
pO2, Ven: 27 mmHg — CL (ref 32.0–45.0)

## 2020-03-28 LAB — LIPASE, BLOOD: Lipase: 19 U/L (ref 11–51)

## 2020-03-28 LAB — CBG MONITORING, ED: Glucose-Capillary: 394 mg/dL — ABNORMAL HIGH (ref 70–99)

## 2020-03-28 MED ORDER — ONDANSETRON HCL 4 MG/2ML IJ SOLN
4.0000 mg | Freq: Once | INTRAMUSCULAR | Status: AC
  Administered 2020-03-28: 4 mg via INTRAVENOUS
  Filled 2020-03-28: qty 2

## 2020-03-28 MED ORDER — INSULIN ASPART 100 UNIT/ML ~~LOC~~ SOLN
10.0000 [IU] | Freq: Once | SUBCUTANEOUS | Status: AC
  Administered 2020-03-28: 10 [IU] via SUBCUTANEOUS
  Filled 2020-03-28: qty 0.1

## 2020-03-28 MED ORDER — ACETAMINOPHEN 500 MG PO TABS
1000.0000 mg | ORAL_TABLET | Freq: Once | ORAL | Status: DC
  Filled 2020-03-28: qty 2

## 2020-03-28 MED ORDER — SODIUM CHLORIDE 0.9 % IV SOLN
1000.0000 mL | INTRAVENOUS | Status: DC
  Administered 2020-03-29 (×2): 1000 mL via INTRAVENOUS

## 2020-03-28 MED ORDER — SODIUM CHLORIDE 0.9 % IV BOLUS (SEPSIS)
1000.0000 mL | Freq: Once | INTRAVENOUS | Status: AC
  Administered 2020-03-28: 1000 mL via INTRAVENOUS

## 2020-03-28 MED ORDER — FENTANYL CITRATE (PF) 100 MCG/2ML IJ SOLN
50.0000 ug | Freq: Once | INTRAMUSCULAR | Status: AC
  Administered 2020-03-28: 50 ug via INTRAVENOUS
  Filled 2020-03-28: qty 2

## 2020-03-28 NOTE — Progress Notes (Signed)
Flowsheet reviewed. Noted MD was able to obtain IV site. Consult cleared.

## 2020-03-28 NOTE — ED Triage Notes (Signed)
Patient arrives from home via EMS with complaints of hyperglycemia, N/V, chest pain on palpation, and abdominal pain. Patient is a non-compliant diabetic who does not check her blood sugar and takes a set amount of insulin daily. Patient states N/V that started today and other symptoms within the last hour. Patient refused IV from EMS due to needing USIV per patient.

## 2020-03-28 NOTE — Progress Notes (Signed)
Responded to consult for IV. MD in room attempting access.

## 2020-03-28 NOTE — ED Provider Notes (Signed)
[facility] DEPT Provider Note   [name]: [id number] Arrival date & time: 03/28/20  2007     History Chief Complaint  Patient presents with  . [name]  . Chest Pain  . Abdominal Pain  . Hyperglycemia    Deborah [name] is a 55 y.o. female.  The history is provided by the patient and medical records. No language interpreter was used.  [name] Associated symptoms: abdominal pain and chest pain   Chest Pain Associated symptoms: abdominal pain and shortness of breath   Abdominal Pain Associated symptoms: chest pain and shortness of breath   Hyperglycemia Associated symptoms: abdominal pain, chest pain and shortness of breath       55 year old female significant history of insulin-dependent diabetes, CHF, carcinoid tumor of the duodenum, fibromyalgias, gastroparesis, obesity, prior stroke brought here via EMS from home for evaluation of abdominal pain. Patient is a poor historian. She mention 2 hours ago she developed pain in her abdomen and [place] with associate nausea vomiting diarrhea. Pain described as crampy achy sensation moderate in severity. She also mentioned yesterday she had the same symptoms as well. She mention she does take her diabetic medication regularly but does not check her blood sugar regularly. She does not complain of any fever or chills no runny nose sneezing or coughing no shortness of breath or dysuria. She does request IV access via ultrasound due to being a hard stick. She request pain medication. She reports she has been vaccinated for COVID-19. Please note patient is drowsy during my examination but easily arousable and answer question appropriately.   Past Medical History:  Diagnosis Date  . Acute back pain with sciatica, left   . Acute back pain with sciatica, right   . AKI (acute kidney injury) ([place])   . Anemia, unspecified   . Cancer ([place])   . Carcinoid tumor of duodenum   . Chronic pain   .  Chronic systolic CHF (congestive heart failure) ([place])   . Diabetes mellitus   . Esophageal reflux   . Fibromyalgia   . Gastric ulcer   . Gastroparesis   . Gout   . Hyperlipidemia   . Hypertension   . Lumbosacral stenosis   . NICM (nonischemic cardiomyopathy) ([place])   . Obesity   . PAF (paroxysmal atrial fibrillation) ([place])   . Stroke ([place]) 02/2011  . Vitamin B12 deficiency anemia     Patient Active Problem List   Diagnosis Date Noted  . Vaginal yeast infection 01/04/2020  . AKI (acute kidney injury) ([place]) 11/27/2019  . Chronic pain 09/04/2019  . Malignant carcinoid tumor of duodenum ([place])   . Nonalcoholic fatty liver disease 06/05/2019  . Chronic diarrhea   . COPD exacerbation ([place]) 05/08/2019  . Acute on chronic HFrEF (heart failure with reduced ejection fraction) ([place])   . Restrictive lung disease secondary to obesity   . History of gastric ulcer   . Uncontrolled type 2 diabetes mellitus with hyperglycemia ([place])   . Fibroid uterus 02/23/2019  . Chronic systolic CHF (congestive heart failure) ([place]) 12/19/2018  . Intractable nausea and vomiting 12/17/2018  . Hypoxia 12/17/2018  . Abdominal pain 07/17/2018  . Cardiac arrest ([place]) 11/29/2017  . Leukocytosis 11/29/2017  . Anxiety 11/29/2017  . Stroke (cerebrum) ([place]) 03/19/2017  . GERD (gastroesophageal reflux disease) 03/19/2017  . Depression 03/19/2017  . Morbid obesity ([place])   . Normocytic normochromic anemia 08/16/2016  . Diabetic gastroparesis ([place]) 06/05/2016  . Gout 06/05/2016  . Nonspecific  chest pain 09/26/2015  . Hypokalemia 09/26/2015  . Hypomagnesemia 09/26/2015  . Nausea and vomiting 08/20/2015  . DKA (diabetic ketoacidoses) 05/25/2015  . Uncontrolled type 2 diabetes mellitus with diabetic neuropathy, with long-term current use of insulin ([place]) 05/25/2015  . Type 2 diabetes mellitus with hyperglycemia, with long-term current use of insulin ([place]) 05/25/2015  . CKD (chronic kidney disease), stage II  05/25/2015  . Essential hypertension, benign 09/28/2013    Past Surgical History:  Procedure Laterality Date  . BIOPSY  07/27/2019   Procedure: BIOPSY;  Surgeon: Clarene Essex, MD;  Location: [facility];  Service: Endoscopy;;  . BIOPSY  07/30/2019   Procedure: BIOPSY;  Surgeon: Otis Brace, MD;  Location: [facility];  Service: Gastroenterology;;  . CATARACT EXTRACTION  01/2014  . CHOLECYSTECTOMY    . COLONOSCOPY WITH PROPOFOL N/A 07/30/2019   Procedure: COLONOSCOPY WITH PROPOFOL;  Surgeon: Otis Brace, MD;  Location: [facility];  Service: Gastroenterology;  Laterality: N/A;  . ESOPHAGOGASTRODUODENOSCOPY N/A 07/27/2019   Procedure: ESOPHAGOGASTRODUODENOSCOPY (EGD);  Surgeon: Clarene Essex, MD;  Location: [facility];  Service: Endoscopy;  Laterality: N/A;  . ESOPHAGOGASTRODUODENOSCOPY (EGD) WITH PROPOFOL N/A 08/02/2019   Procedure: ESOPHAGOGASTRODUODENOSCOPY (EGD) WITH PROPOFOL;  Surgeon: Otis Brace, MD;  Location: [facility];  Service: Gastroenterology;  Laterality: N/A;  . HEMOSTASIS CLIP PLACEMENT  08/02/2019   Procedure: HEMOSTASIS CLIP PLACEMENT;  Surgeon: Otis Brace, MD;  Location: [facility];  Service: Gastroenterology;;  . POLYPECTOMY  07/30/2019   Procedure: POLYPECTOMY;  Surgeon: Otis Brace, MD;  Location: [facility];  Service: Gastroenterology;;  . POLYPECTOMY  08/02/2019   Procedure: POLYPECTOMY;  Surgeon: Otis Brace, MD;  Location: [facility];  Service: Gastroenterology;;     OB History    Gravida  0   Para  0   Term  0   Preterm  0   AB  0   Living  0     SAB  0   IAB  0   Ectopic  0   Multiple  0   Live Births  0           Family History  Problem Relation Age of Onset  . Diabetes Mother   . Diabetes Father   . Heart disease Father   . Diabetes Sister   . Congestive Heart Failure Sister 58  . Diabetes Brother     Social History   Tobacco Use  . Smoking status: Never Smoker  . Smokeless  tobacco: Never Used  Vaping Use  . Vaping Use: Never used  Substance Use Topics  . Alcohol use: No  . Drug use: No    Home Medications Prior to Admission medications   Medication Sig Start Date End Date Taking? Authorizing Provider  albuterol (PROVENTIL) (2.5 MG/3ML) 0.083% nebulizer solution Take 3 mLs (2.5 mg total) by nebulization every 6 (six) hours as needed for wheezing or shortness of breath. 04/06/19   Scot Jun, FNP  allopurinol (ZYLOPRIM) 100 MG tablet Take 100 mg by mouth daily.    [provider]  ALPRAZolam Duanne Moron) 1 MG tablet Take 1 tablet (1 mg total) by mouth 2 (two) times daily as needed for anxiety. 09/03/19   Lockamy, Christia Reading, DO  atorvastatin (LIPITOR) 10 MG tablet TAKE ONE TABLET BY MOUTH ONCE DAILY (AM) Patient taking differently: Take 10 mg by mouth daily. 11/14/19   Meccariello, Bernita Raisin, DO  blood glucose meter kit and supplies KIT Dispense based on patient and insurance preference. Use up to four times daily as  directed. (FOR ICD-9 250.00, 250.01). 12/13/18   [name], DO  cetirizine (ZYRTEC) 10 MG tablet TAKE ONE TABLET BY MOUTH ONCE DAILY (AM) Patient taking differently: Take 10 mg by mouth daily. 11/14/19   Meccariello, Bernita Raisin, DO  colchicine 0.6 MG tablet Take 2 tablets at 1 time (1.2 mg total) and 1 hour later take 1 tablet Patient taking differently: Take 0.6 mg by mouth See admin instructions. Take 2 tablets (1.75m totally) by mouth at 1 time and 1 hour later take 1 tablet (0.6 mg totally) as needed for gout flares 01/04/20   CGifford Shave MD  DULoxetine HCl 40 MG CPEP Take 40 mg by mouth in the morning and at bedtime.    [provider]  EASY COMFORT PEN NEEDLES 31G X 5 MM MISC USE 3 TIMES A DAY FOR INSULIN ADMINISTRATION 11/14/19   Meccariello, BMel AlmondJ, DO  ELIQUIS 5 MG TABS tablet TAKE 1 TABLET BY MOUTH 2 (TWO) TIMES DAILY. Patient taking differently: Take 5 mg by mouth 2 (two) times daily. 12/20/19   Meccariello, BBernita Raisin  DO  FARXIGA 10 MG TABS tablet Take 10 mg by mouth daily. 10/02/19   [provider]  ferrous sulfate 325 (65 FE) MG tablet Take 1 tablet (325 mg total) by mouth daily with breakfast. 09/01/19 01/23/20  EAlma Friendly MD  fluticasone (FLONASE) 50 MCG/ACT nasal spray Place 2 sprays into both nostrils daily as needed for allergies or rhinitis. 12/19/18   Rai, Ripudeep K, MD  hydrALAZINE (APRESOLINE) 25 MG tablet TAKE 1 TABLET BY MOUTH 3 (THREE) TIMES DAILY. FOR BLOOD PRESSURE Patient taking differently: Take 25 mg by mouth 3 (three) times daily. 10/02/19   Meccariello, BBernita Raisin DO  HYDROmorphone (DILAUDID) 2 MG tablet Take 1 tablet (2 mg total) by mouth daily as needed for severe pain. 02/26/20   Raulkar, KClide Deutscher MD  hydrOXYzine (ATARAX/VISTARIL) 10 MG tablet Take 1 tablet (10 mg total) by mouth 3 (three) times daily as needed. Patient taking differently: Take 10 mg by mouth 3 (three) times daily as needed for itching or anxiety. 11/15/19   Meccariello, BBernita Raisin DO  isosorbide mononitrate (IMDUR) 60 MG 24 hr tablet TAKE 1 TABLET (60 MG TOTAL) BY MOUTH DAILY. 02/04/20   Meccariello, BBernita Raisin DO  LANTUS SOLOSTAR 100 UNIT/ML Solostar Pen Inject 32 Units into the skin at bedtime. 01/28/20 02/29/20  SRaiford NobleLatif, DO  melatonin 3 MG TABS tablet Take 2 tablets (6 mg total) by mouth at bedtime. 02/04/20   Meccariello, BBernita Raisin DO  metoprolol succinate (TOPROL-XL) 25 MG 24 hr tablet Take 1 tablet (25 mg total) by mouth daily. 02/04/20   Meccariello, BBernita Raisin DO  nitroGLYCERIN (NITROSTAT) 0.4 MG SL tablet Place 1 tablet (0.4 mg total) under the tongue every 5 (five) minutes as needed for chest pain. Patient taking differently: Place 0.4 mg under the tongue every 5 (five) minutes as needed for chest pain (max 3 doses). 12/13/18   Lockamy, Timothy, DO  NOVOLOG FLEXPEN 100 UNIT/ML FlexPen Inject 30 Units into the skin in the morning, at noon, in the evening, and at bedtime. 02/04/20    Meccariello, BBernita Raisin DO  ondansetron (ZOFRAN) 4 MG tablet Take 1 tablet (4 mg total) by mouth every 6 (six) hours as needed for nausea. 02/26/20   Raulkar, KClide Deutscher MD  pantoprazole (PROTONIX) 40 MG tablet TAKE ONE TABLET BY MOUTH TWICE DAILY Patient taking differently: Take 40 mg by mouth 2 (two) times daily. 09/04/19  [name], DO  torsemide (DEMADEX) 20 MG tablet TAKE 2 TABLETS (40 MG TOTAL) BY MOUTH DAILY. 02/04/20   Meccariello, Bernita Raisin, DO    Allergies    Diazepam, Isovue [iopamidol], Lisinopril, Metoclopramide, Penicillins, Tolmetin, Aspirin, Nsaids, Acetaminophen, and Tramadol  Review of Systems   Review of Systems  Respiratory: Positive for shortness of breath.   Cardiovascular: Positive for chest pain.  Gastrointestinal: Positive for abdominal pain.  All other systems reviewed and are negative.   Physical Exam Updated Vital Signs BP 107/62   Pulse 71   Temp (!) 97.4 F (36.3 C) (Oral)   Resp (!) 21   LMP 10/10/2012   SpO2 97%   Physical Exam Vitals and nursing note reviewed.  Constitutional:      General: She is not in acute distress.    Appearance: She is well-developed and well-nourished. She is obese.     Comments: Appears drowsy and sleepy but easily arousable and in no acute discomfort  HENT:     Head: Atraumatic.  Eyes:     Conjunctiva/sclera: Conjunctivae normal.  Cardiovascular:     Rate and Rhythm: Normal rate and regular rhythm.  Pulmonary:     Effort: Pulmonary effort is normal.     Breath sounds: Normal breath sounds. No decreased breath sounds, wheezing, rhonchi or rales.  Chest:     Chest wall: Tenderness (Tenderness to anterior chest wall with gentle palpation) present.  Abdominal:     Palpations: Abdomen is soft.     Tenderness: There is abdominal tenderness (Diffuse tenderness without guarding or rebound tenderness).  Musculoskeletal:        General: Normal range of motion.     Cervical back: Neck supple.  Skin:    General:  Skin is warm.     Findings: No rash.  Neurological:     General: No focal deficit present.     GCS: GCS eye subscore is 3. GCS verbal subscore is 5. GCS motor subscore is 6.  Psychiatric:        Mood and Affect: Mood and affect and mood normal.     ED Results / Procedures / Treatments   Labs (all labs ordered are listed, but only abnormal results are displayed) Labs Reviewed  CBC WITH DIFFERENTIAL/PLATELET - Abnormal; Notable for the following components:      Result Value   WBC 11.4 (*)    RBC 3.49 (*)    Hemoglobin 9.7 (*)    HCT 31.5 (*)    Platelets 411 (*)    Neutro Abs 9.6 (*)    All other components within normal limits  COMPREHENSIVE METABOLIC PANEL - Abnormal; Notable for the following components:   Glucose, Bld 427 (*)    Creatinine, Ser 1.64 (*)    Calcium 8.8 (*)    Albumin 3.2 (*)    AST 53 (*)    ALT 71 (*)    GFR, Estimated 37 (*)    All other components within normal limits  BLOOD GAS, VENOUS - Abnormal; Notable for the following components:   pO2, Ven 27.0 (*)    All other components within normal limits  CBG MONITORING, ED - Abnormal; Notable for the following components:   Glucose-Capillary 394 (*)    All other components within normal limits  LIPASE, BLOOD  URINALYSIS, ROUTINE W REFLEX MICROSCOPIC    EKG None  ED ECG REPORT   Date: 03/28/2020  Rate: 79  Rhythm: normal sinus rhythm  QRS Axis: left  Intervals: QT  prolonged  ST/T Wave abnormalities: normal  Conduction Disutrbances:none  Narrative Interpretation:   Old EKG Reviewed: unchanged  I have personally reviewed the EKG tracing and agree with the computerized printout as noted.   Radiology No results found.  Procedures Procedures (including critical care time)  EMERGENCY DEPARTMENT  US GUIDANCE EXAM Emergency Ultrasound:  US Guidance for Needle Guidance  INDICATIONS: Difficult vascular access Linear probe used in real-time to visualize location of needle entry through  skin.   PERFORMED BY: Myself and Dr. Darl Householder IMAGES ARCHIVED?: No LIMITATIONS: Pain VIEWS USED: Transverse INTERPRETATION: Left arm and Needle gauge 20   Medications Ordered in ED Medications - No data to display  ED Course  I have reviewed the triage vital signs and the nursing notes.  Pertinent labs & imaging results that were available during my care of the patient were reviewed by me and considered in my medical decision making (see chart for details).    MDM Rules/Calculators/A&P                          BP 107/62   Pulse 71   Temp (!) 97.4 F (36.3 C) (Oral)   Resp (!) 21   LMP 10/10/2012   SpO2 97%   Final Clinical Impression(s) / ED Diagnoses Final diagnoses:  None    Rx / DC Orders ED Discharge Orders    None     9:14 PM Patient is noncompliant with her diabetic treatment and she does not normally check her blood sugar. She is here with complaints of nausea vomiting diarrhea abdominal pain chest pain which sounds like a recurrent symptom. She was found to be hyperglycemic with a CBG of 394. She is mildly hypotensive with a blood pressure of 99/58 but is not tachycardic no fever and no hypoxia. She has been vaccinated for COVID-19. Difficult IV access, will obtain IV via ultrasound.  11:31 PM EKG with mildly prolonged QT.  Venous pH is 7.35.  WBC 11.4, hemoglobin 9.7, creatinine of 1.64, CBG of 427 with normal anion gap, normal lipase  She is currently receiving IV fluid.  I did provide 1 dose of pain medication however with her chronic abdominal pain, will try to avoid additional opiate medication.  12:02 AM Pt sign out to oncoming provider who will continue with sxs control and recheck CBG.    Domenic Moras, PA-C 03/29/20 0002    Drenda Freeze, MD 03/29/20 [phone]

## 2020-03-29 ENCOUNTER — Observation Stay (HOSPITAL_COMMUNITY): Payer: Medicare Other

## 2020-03-29 DIAGNOSIS — E1122 Type 2 diabetes mellitus with diabetic chronic kidney disease: Secondary | ICD-10-CM | POA: Diagnosis present

## 2020-03-29 DIAGNOSIS — Z6841 Body Mass Index (BMI) 40.0 and over, adult: Secondary | ICD-10-CM | POA: Diagnosis not present

## 2020-03-29 DIAGNOSIS — K3184 Gastroparesis: Secondary | ICD-10-CM | POA: Diagnosis present

## 2020-03-29 DIAGNOSIS — E785 Hyperlipidemia, unspecified: Secondary | ICD-10-CM | POA: Diagnosis present

## 2020-03-29 DIAGNOSIS — I130 Hypertensive heart and chronic kidney disease with heart failure and stage 1 through stage 4 chronic kidney disease, or unspecified chronic kidney disease: Secondary | ICD-10-CM | POA: Diagnosis present

## 2020-03-29 DIAGNOSIS — D490 Neoplasm of unspecified behavior of digestive system: Secondary | ICD-10-CM | POA: Diagnosis present

## 2020-03-29 DIAGNOSIS — D631 Anemia in chronic kidney disease: Secondary | ICD-10-CM | POA: Diagnosis present

## 2020-03-29 DIAGNOSIS — Z794 Long term (current) use of insulin: Secondary | ICD-10-CM | POA: Diagnosis not present

## 2020-03-29 DIAGNOSIS — Z79899 Other long term (current) drug therapy: Secondary | ICD-10-CM | POA: Diagnosis not present

## 2020-03-29 DIAGNOSIS — E1143 Type 2 diabetes mellitus with diabetic autonomic (poly)neuropathy: Secondary | ICD-10-CM | POA: Diagnosis present

## 2020-03-29 DIAGNOSIS — I5022 Chronic systolic (congestive) heart failure: Secondary | ICD-10-CM | POA: Diagnosis present

## 2020-03-29 DIAGNOSIS — M797 Fibromyalgia: Secondary | ICD-10-CM | POA: Diagnosis present

## 2020-03-29 DIAGNOSIS — Z8249 Family history of ischemic heart disease and other diseases of the circulatory system: Secondary | ICD-10-CM | POA: Diagnosis not present

## 2020-03-29 DIAGNOSIS — I480 Paroxysmal atrial fibrillation: Secondary | ICD-10-CM | POA: Diagnosis present

## 2020-03-29 DIAGNOSIS — R112 Nausea with vomiting, unspecified: Secondary | ICD-10-CM | POA: Diagnosis not present

## 2020-03-29 DIAGNOSIS — M109 Gout, unspecified: Secondary | ICD-10-CM | POA: Diagnosis present

## 2020-03-29 DIAGNOSIS — Z833 Family history of diabetes mellitus: Secondary | ICD-10-CM | POA: Diagnosis not present

## 2020-03-29 DIAGNOSIS — Z9049 Acquired absence of other specified parts of digestive tract: Secondary | ICD-10-CM | POA: Diagnosis not present

## 2020-03-29 DIAGNOSIS — N1832 Chronic kidney disease, stage 3b: Secondary | ICD-10-CM | POA: Diagnosis present

## 2020-03-29 DIAGNOSIS — K219 Gastro-esophageal reflux disease without esophagitis: Secondary | ICD-10-CM | POA: Diagnosis present

## 2020-03-29 DIAGNOSIS — E1165 Type 2 diabetes mellitus with hyperglycemia: Secondary | ICD-10-CM | POA: Diagnosis present

## 2020-03-29 DIAGNOSIS — M4807 Spinal stenosis, lumbosacral region: Secondary | ICD-10-CM | POA: Diagnosis present

## 2020-03-29 DIAGNOSIS — Z8679 Personal history of other diseases of the circulatory system: Secondary | ICD-10-CM | POA: Diagnosis not present

## 2020-03-29 DIAGNOSIS — Z20822 Contact with and (suspected) exposure to covid-19: Secondary | ICD-10-CM | POA: Diagnosis present

## 2020-03-29 LAB — CBG MONITORING, ED: Glucose-Capillary: 327 mg/dL — ABNORMAL HIGH (ref 70–99)

## 2020-03-29 LAB — GLUCOSE, CAPILLARY
Glucose-Capillary: 201 mg/dL — ABNORMAL HIGH (ref 70–99)
Glucose-Capillary: 294 mg/dL — ABNORMAL HIGH (ref 70–99)
Glucose-Capillary: 272 mg/dL — ABNORMAL HIGH (ref 70–99)

## 2020-03-29 LAB — SARS CORONAVIRUS 2 (TAT 6-24 HRS): SARS Coronavirus 2: NEGATIVE

## 2020-03-29 LAB — HEMOGLOBIN A1C
Hgb A1c MFr Bld: 8 % — ABNORMAL HIGH (ref 4.8–5.6)
Mean Plasma Glucose: 182.9 mg/dL

## 2020-03-29 MED ORDER — LORATADINE 10 MG PO TABS
10.0000 mg | ORAL_TABLET | Freq: Every day | ORAL | Status: DC
  Filled 2020-03-29: qty 1

## 2020-03-29 MED ORDER — ALLOPURINOL 100 MG PO TABS
100.0000 mg | ORAL_TABLET | Freq: Every day | ORAL | Status: DC
  Administered 2020-03-29: 100 mg via ORAL
  Filled 2020-03-29 (×2): qty 1

## 2020-03-29 MED ORDER — PANTOPRAZOLE SODIUM 40 MG IV SOLR
40.0000 mg | Freq: Once | INTRAVENOUS | Status: AC
  Administered 2020-03-29: 40 mg via INTRAVENOUS
  Filled 2020-03-29: qty 40

## 2020-03-29 MED ORDER — DRONABINOL 5 MG PO CAPS
5.0000 mg | ORAL_CAPSULE | Freq: Two times a day (BID) | ORAL | Status: DC
  Administered 2020-03-29 (×2): 5 mg via ORAL
  Filled 2020-03-29: qty 1

## 2020-03-29 MED ORDER — LIP MEDEX EX OINT
TOPICAL_OINTMENT | Freq: Once | CUTANEOUS | Status: AC
  Filled 2020-03-29: qty 7

## 2020-03-29 MED ORDER — ISOSORBIDE MONONITRATE ER 60 MG PO TB24
60.0000 mg | ORAL_TABLET | Freq: Every day | ORAL | Status: DC
  Administered 2020-03-29: 60 mg via ORAL
  Filled 2020-03-29 (×2): qty 1

## 2020-03-29 MED ORDER — LORAZEPAM 2 MG/ML IJ SOLN
0.5000 mg | Freq: Four times a day (QID) | INTRAMUSCULAR | Status: DC | PRN
  Administered 2020-03-29 – 2020-03-30 (×3): 0.5 mg via INTRAVENOUS
  Filled 2020-03-29 (×3): qty 1

## 2020-03-29 MED ORDER — ACETAMINOPHEN 325 MG PO TABS
650.0000 mg | ORAL_TABLET | Freq: Four times a day (QID) | ORAL | Status: DC | PRN
  Filled 2020-03-29 (×2): qty 2

## 2020-03-29 MED ORDER — APIXABAN 5 MG PO TABS
5.0000 mg | ORAL_TABLET | Freq: Two times a day (BID) | ORAL | Status: DC
  Administered 2020-03-29 (×2): 5 mg via ORAL
  Filled 2020-03-29 (×3): qty 1

## 2020-03-29 MED ORDER — INSULIN ASPART 100 UNIT/ML ~~LOC~~ SOLN
0.0000 [IU] | Freq: Every day | SUBCUTANEOUS | Status: DC
  Administered 2020-03-29: 3 [IU] via SUBCUTANEOUS

## 2020-03-29 MED ORDER — ONDANSETRON HCL 4 MG/2ML IJ SOLN
4.0000 mg | Freq: Once | INTRAMUSCULAR | Status: AC
  Administered 2020-03-29: 4 mg via INTRAVENOUS
  Filled 2020-03-29: qty 2

## 2020-03-29 MED ORDER — HYDROMORPHONE HCL 2 MG PO TABS
2.0000 mg | ORAL_TABLET | Freq: Once | ORAL | Status: AC
  Administered 2020-03-29: 2 mg via ORAL
  Filled 2020-03-29: qty 1

## 2020-03-29 MED ORDER — PANTOPRAZOLE SODIUM 40 MG PO TBEC
40.0000 mg | DELAYED_RELEASE_TABLET | Freq: Two times a day (BID) | ORAL | Status: DC
Start: 2020-03-29 — End: 2020-03-30
  Administered 2020-03-29: 40 mg via ORAL
  Filled 2020-03-29 (×2): qty 1

## 2020-03-29 MED ORDER — INSULIN ASPART 100 UNIT/ML ~~LOC~~ SOLN
0.0000 [IU] | Freq: Three times a day (TID) | SUBCUTANEOUS | Status: DC
  Administered 2020-03-29 – 2020-03-30 (×2): 11 [IU] via SUBCUTANEOUS

## 2020-03-29 MED ORDER — PREGABALIN 50 MG PO CAPS
50.0000 mg | ORAL_CAPSULE | Freq: Two times a day (BID) | ORAL | Status: DC
  Administered 2020-03-29 – 2020-03-30 (×3): 50 mg via ORAL
  Filled 2020-03-29 (×3): qty 1

## 2020-03-29 MED ORDER — ACETAMINOPHEN 650 MG RE SUPP: 650.0000 mg | Freq: Four times a day (QID) | RECTAL | Status: DC | PRN

## 2020-03-29 MED ORDER — INSULIN GLARGINE 100 UNIT/ML ~~LOC~~ SOLN
40.0000 [IU] | Freq: Every day | SUBCUTANEOUS | Status: DC
  Administered 2020-03-29: 40 [IU] via SUBCUTANEOUS
  Filled 2020-03-29: qty 0.4

## 2020-03-29 MED ORDER — DULOXETINE HCL 20 MG PO CPEP
40.0000 mg | ORAL_CAPSULE | Freq: Every day | ORAL | Status: DC
  Administered 2020-03-29: 40 mg via ORAL
  Filled 2020-03-29 (×2): qty 2

## 2020-03-29 MED ORDER — DAPAGLIFLOZIN PROPANEDIOL 10 MG PO TABS
10.0000 mg | ORAL_TABLET | Freq: Every day | ORAL | Status: DC
  Administered 2020-03-29: 10 mg via ORAL
  Filled 2020-03-29 (×2): qty 1

## 2020-03-29 MED ORDER — PROCHLORPERAZINE EDISYLATE 10 MG/2ML IJ SOLN
5.0000 mg | Freq: Four times a day (QID) | INTRAMUSCULAR | Status: DC | PRN
  Administered 2020-03-29 – 2020-03-30 (×4): 5 mg via INTRAVENOUS
  Filled 2020-03-29 (×4): qty 2

## 2020-03-29 MED ORDER — HYDROMORPHONE HCL 1 MG/ML IJ SOLN
1.0000 mg | Freq: Once | INTRAMUSCULAR | Status: AC
  Administered 2020-03-29: 1 mg via INTRAVENOUS
  Filled 2020-03-29: qty 1

## 2020-03-29 MED ORDER — ATORVASTATIN CALCIUM 10 MG PO TABS: 10.0000 mg | ORAL_TABLET | Freq: Every day | ORAL | Status: DC

## 2020-03-29 MED ORDER — METOPROLOL SUCCINATE ER 25 MG PO TB24
25.0000 mg | ORAL_TABLET | Freq: Every day | ORAL | Status: DC
  Administered 2020-03-29: 25 mg via ORAL
  Filled 2020-03-29 (×2): qty 1

## 2020-03-29 NOTE — H&P (Signed)
History and Physical    Deborah [name] PQZ:[id number] DOB: [birth year] DOA: 03/28/2020  PCP: Cleophas Dunker, DO  Patient coming from: Home  Chief Complaint: N/V, ab pain.  HPI: Deborah [name] is a 55 y.o. female with medical history significant of chronic narcotic use, DM2, HTN, gastroparesis. Presenting with N/V and ab pain. Reports that she's had all these symptoms for the last 2 days. She says the only thing that helps is dilaudid. Her home dilaudid is no longer working. She decided that she needed to come to the ED for help. She denies any other aggravating or alleviating factors. She denies any other treatments.    ED Course: She was given fluids, diuladid, fentanyl, and zofran. TRH was called for admission.   Review of Systems:  Denies fever, diarrhea. Report midsternal CP. Review of systems is otherwise negative for all not mentioned in HPI.   PMHx Past Medical History:  Diagnosis Date  . Acute back pain with sciatica, left   . Acute back pain with sciatica, right   . AKI (acute kidney injury) ([place])   . Anemia, unspecified   . Cancer ([place])   . Carcinoid tumor of duodenum   . Chronic pain   . Chronic systolic CHF (congestive heart failure) ([place])   . Diabetes mellitus   . Esophageal reflux   . Fibromyalgia   . Gastric ulcer   . Gastroparesis   . Gout   . Hyperlipidemia   . Hypertension   . Lumbosacral stenosis   . NICM (nonischemic cardiomyopathy) ([place])   . Obesity   . PAF (paroxysmal atrial fibrillation) ([place])   . Stroke ([place]) 02/2011  . Vitamin B12 deficiency anemia     PSHx Past Surgical History:  Procedure Laterality Date  . BIOPSY  07/27/2019   Procedure: BIOPSY;  Surgeon: Clarene Essex, MD;  Location: [facility];  Service: Endoscopy;;  . BIOPSY  07/30/2019   Procedure: BIOPSY;  Surgeon: Otis Brace, MD;  Location: [facility];  Service: Gastroenterology;;  . CATARACT EXTRACTION  01/2014  . CHOLECYSTECTOMY    . COLONOSCOPY WITH  PROPOFOL N/A 07/30/2019   Procedure: COLONOSCOPY WITH PROPOFOL;  Surgeon: Otis Brace, MD;  Location: [facility];  Service: Gastroenterology;  Laterality: N/A;  . ESOPHAGOGASTRODUODENOSCOPY N/A 07/27/2019   Procedure: ESOPHAGOGASTRODUODENOSCOPY (EGD);  Surgeon: Clarene Essex, MD;  Location: [facility];  Service: Endoscopy;  Laterality: N/A;  . ESOPHAGOGASTRODUODENOSCOPY (EGD) WITH PROPOFOL N/A 08/02/2019   Procedure: ESOPHAGOGASTRODUODENOSCOPY (EGD) WITH PROPOFOL;  Surgeon: Otis Brace, MD;  Location: [facility];  Service: Gastroenterology;  Laterality: N/A;  . HEMOSTASIS CLIP PLACEMENT  08/02/2019   Procedure: HEMOSTASIS CLIP PLACEMENT;  Surgeon: Otis Brace, MD;  Location: [facility];  Service: Gastroenterology;;  . POLYPECTOMY  07/30/2019   Procedure: POLYPECTOMY;  Surgeon: Otis Brace, MD;  Location: [facility];  Service: Gastroenterology;;  . POLYPECTOMY  08/02/2019   Procedure: POLYPECTOMY;  Surgeon: Otis Brace, MD;  Location: [facility];  Service: Gastroenterology;;    SocHx  reports that she has never smoked. She has never used smokeless tobacco. She reports that she does not drink alcohol and does not use drugs.  Allergies  Allergen Reactions  . Diazepam [name]  . Isovue [Iopamidol] Anaphylaxis    11/28/17 Patient had seizure like activity and then 1 min code after 100 cc of isovue 300. Possible contrast allergy vs vasovagal episode  . Lisinopril Anaphylaxis    Tongue and mouth swelling  . Metoclopramide Other (See Comments)    Tardive dyskinesia  .  Penicillins Palpitations    Has patient had a PCN reaction causing immediate rash, facial/tongue/throat swelling, SOB or lightheadedness with hypotension: Yes, heart races Has patient had a PCN reaction causing severe rash involving mucus membranes or skin necrosis: No Has patient had a PCN reaction that required hospitalization: Yes  Has patient had a PCN reaction occurring within the  last 10 years: No   . Tolmetin Nausea Only and Other (See Comments)    ULCER  . Aspirin Other (See Comments)    Irritates stomach ulcer   . Nsaids Other (See Comments)    ULCER  . Acetaminophen Nausea Only and Other (See Comments)    Irritates stomach ulcer; Abdominal pain  . Tramadol Nausea And Vomiting    FamHx Family History  Problem Relation Age of Onset  . Diabetes Mother   . Diabetes Father   . Heart disease Father   . Diabetes Sister   . Congestive Heart Failure Sister 52  . Diabetes Brother     Prior to Admission medications   Medication Sig Start Date End Date Taking? Authorizing Provider  albuterol (PROVENTIL) (2.5 MG/3ML) 0.083% nebulizer solution Take 3 mLs (2.5 mg total) by nebulization every 6 (six) hours as needed for wheezing or shortness of breath. 04/06/19  Yes Scot Jun, FNP  allopurinol (ZYLOPRIM) 100 MG tablet Take 100 mg by mouth daily.   Yes [provider]  ALPRAZolam Duanne Moron) 1 MG tablet Take 1 tablet (1 mg total) by mouth 2 (two) times daily as needed for anxiety. 09/03/19  Yes Lockamy, Timothy, DO  atorvastatin (LIPITOR) 10 MG tablet TAKE ONE TABLET BY MOUTH ONCE DAILY (AM) Patient taking differently: Take 10 mg by mouth daily. 11/14/19  Yes Meccariello, Bernita Raisin, DO  blood glucose meter kit and supplies KIT Dispense based on patient and insurance preference. Use up to four times daily as directed. (FOR ICD-9 250.00, 250.01). 12/13/18  Yes Lockamy, Timothy, DO  cetirizine (ZYRTEC) 10 MG tablet TAKE ONE TABLET BY MOUTH ONCE DAILY (AM) Patient taking differently: Take 10 mg by mouth daily. 11/14/19  Yes Meccariello, Bernita Raisin, DO  colchicine 0.6 MG tablet Take 2 tablets at 1 time (1.2 mg total) and 1 hour later take 1 tablet Patient taking differently: Take 0.6 mg by mouth See admin instructions. Take 2 tablets (1.27m totally) by mouth at 1 time and 1 hour later take 1 tablet (0.6 mg totally) as needed for gout flares 01/04/20  Yes CGifford Shave MD  DULoxetine HCl 40 MG CPEP Take 40 mg by mouth in the morning and at bedtime.   Yes [provider]  EASY COMFORT PEN NEEDLES 31G X 5 MM MISC USE 3 TIMES A DAY FOR INSULIN ADMINISTRATION 11/14/19  Yes Meccariello, BBernita Raisin DO  ELIQUIS 5 MG TABS tablet TAKE 1 TABLET BY MOUTH 2 (TWO) TIMES DAILY. Patient taking differently: Take 5 mg by mouth 2 (two) times daily. 12/20/19  Yes Meccariello, BBernita Raisin DO  FARXIGA 10 MG TABS tablet Take 10 mg by mouth daily. 10/02/19  Yes [provider]  fluticasone (FLONASE) 50 MCG/ACT nasal spray Place 2 sprays into both nostrils daily as needed for allergies or rhinitis. 12/19/18  Yes Rai, Ripudeep K, MD  hydrALAZINE (APRESOLINE) 25 MG tablet TAKE 1 TABLET BY MOUTH 3 (THREE) TIMES DAILY. FOR BLOOD PRESSURE Patient taking differently: Take 25 mg by mouth 3 (three) times daily. 10/02/19  Yes Meccariello, BBernita Raisin DO  isosorbide mononitrate (IMDUR) 60 MG 24 hr tablet TAKE  1 TABLET (60 MG TOTAL) BY MOUTH DAILY. 02/04/20  Yes Meccariello, Bernita Raisin, DO  LANTUS SOLOSTAR 100 UNIT/ML Solostar Pen Inject 32 Units into the skin at bedtime. Patient taking differently: Inject 50 Units into the skin at bedtime. 01/28/20 02/29/20 Yes Sheikh, Omair Latif, DO  melatonin 3 MG TABS tablet Take 2 tablets (6 mg total) by mouth at bedtime. 02/04/20  Yes Meccariello, Bernita Raisin, DO  metoprolol succinate (TOPROL-XL) 25 MG 24 hr tablet Take 1 tablet (25 mg total) by mouth daily. 02/04/20  Yes Meccariello, Bernita Raisin, DO  nitroGLYCERIN (NITROSTAT) 0.4 MG SL tablet Place 1 tablet (0.4 mg total) under the tongue every 5 (five) minutes as needed for chest pain. Patient taking differently: Place 0.4 mg under the tongue every 5 (five) minutes as needed for chest pain (max 3 doses). 12/13/18  Yes Lockamy, Timothy, DO  NOVOLOG FLEXPEN 100 UNIT/ML FlexPen Inject 30 Units into the skin in the morning, at noon, in the evening, and at bedtime. 02/04/20  Yes Meccariello, Bernita Raisin, DO   ondansetron (ZOFRAN) 4 MG tablet Take 1 tablet (4 mg total) by mouth every 6 (six) hours as needed for nausea. 02/26/20  Yes Raulkar, Clide Deutscher, MD  pantoprazole (PROTONIX) 40 MG tablet TAKE ONE TABLET BY MOUTH TWICE DAILY Patient taking differently: Take 40 mg by mouth 2 (two) times daily. 09/04/19  Yes [name], DO  ferrous sulfate 325 (65 FE) MG tablet Take 1 tablet (325 mg total) by mouth daily with breakfast. Patient not taking: Reported on 03/28/2020 09/01/19 01/23/20  Alma Friendly, MD  HYDROmorphone (DILAUDID) 2 MG tablet Take 1 tablet (2 mg total) by mouth daily as needed for severe pain. Patient not taking: Reported on 03/28/2020 02/26/20   Izora Ribas, MD  hydrOXYzine (ATARAX/VISTARIL) 10 MG tablet Take 1 tablet (10 mg total) by mouth 3 (three) times daily as needed. Patient not taking: No sig reported 11/15/19   Meccariello, Bernita Raisin, DO  torsemide (DEMADEX) 20 MG tablet TAKE 2 TABLETS (40 MG TOTAL) BY MOUTH DAILY. Patient not taking: Reported on 03/28/2020 02/04/20   Cleophas Dunker, DO    Physical Exam: Vitals:   03/29/20 0700 03/29/20 0730 03/29/20 0800 03/29/20 0830  BP: (!) 165/92 (!) 149/91 136/86 (!) 146/88  Pulse: (!) 101 94 98 99  Resp: (!) 22 20 (!) 24 (!) 21  Temp:      TempSrc:      SpO2: 95% 97% 96% 96%    General: 55 y.o. female resting in bed in NAD Eyes: PERRL, normal sclera ENMT: Nares patent w/o discharge, orophaynx clear, dentition normal, ears w/o discharge/lesions/ulcers Neck: Supple, trachea midline Cardiovascular: RRR, +S1, S2, no m/g/r, equal pulses throughout, reproducible chest pain w/ palpation Respiratory: CTABL, no w/r/r, normal WOB GI: BS+, obese, soft, LUQ TTP, no masses noted, no organomegaly noted MSK: No c/c; chronic BLE edema w/ chronic skin changes Skin: No rashes, bruises, ulcerations noted Neuro: A&O x 3, no focal deficits Psyc: Appropriate interaction and affect, calm/cooperative  Labs on Admission: I have  personally reviewed following labs and imaging studies  CBC: Recent Labs  Lab 03/28/20 2152  WBC 11.4*  NEUTROABS 9.6*  HGB 9.7*  HCT 31.5*  MCV 90.3  PLT 088*   Basic Metabolic Panel: Recent Labs  Lab 03/28/20 2152  NA 136  K 4.3  CL 100  CO2 23  GLUCOSE 427*  BUN 18  CREATININE 1.64*  CALCIUM 8.8*   GFR: CrCl cannot be calculated (Unknown  ideal weight.). Liver Function Tests: Recent Labs  Lab 03/28/20 2152  AST 53*  ALT 71*  ALKPHOS 112  BILITOT 1.1  PROT 7.6  ALBUMIN 3.2*   Recent Labs  Lab 03/28/20 2152  LIPASE 19   No results for input(s): AMMONIA in the last 168 hours. Coagulation Profile: No results for input(s): INR, PROTIME in the last 168 hours. Cardiac Enzymes: No results for input(s): CKTOTAL, CKMB, CKMBINDEX, TROPONINI in the last 168 hours. BNP (last 3 results) No results for input(s): PROBNP in the last 8760 hours. HbA1C: No results for input(s): HGBA1C in the last 72 hours. CBG: Recent Labs  Lab 03/28/20 2028 03/29/20 0008  GLUCAP 394* 327*   Lipid Profile: No results for input(s): CHOL, HDL, LDLCALC, TRIG, CHOLHDL, LDLDIRECT in the last 72 hours. Thyroid Function Tests: No results for input(s): TSH, T4TOTAL, FREET4, T3FREE, THYROIDAB in the last 72 hours. Anemia Panel: No results for input(s): VITAMINB12, FOLATE, FERRITIN, TIBC, IRON, RETICCTPCT in the last 72 hours. Urine analysis:    Component Value Date/Time   COLORURINE STRAW (A) 01/24/2020 0000   APPEARANCEUR CLEAR 01/24/2020 0000   LABSPEC 1.008 01/24/2020 0000   PHURINE 5.0 01/24/2020 0000   GLUCOSEU >=500 (A) 01/24/2020 0000   HGBUR NEGATIVE 01/24/2020 0000   BILIRUBINUR NEGATIVE 01/24/2020 0000   KETONESUR NEGATIVE 01/24/2020 0000   PROTEINUR >=300 (A) 01/24/2020 0000   UROBILINOGEN 0.2 10/02/2013 2108   NITRITE NEGATIVE 01/24/2020 0000   LEUKOCYTESUR NEGATIVE 01/24/2020 0000    Radiological Exams on Admission: No results found.  EKG: Independently  reviewed. Sinus, no st elevations; prolonged Qtc  Assessment/Plan Intractable N/V Diabetic gastroparesis DM2     - admit to obs, tele     - compazine, dronabinol, ativan     - avoid opioids; add pregablin     - SSI, CLD, A1c, glucose checks  HTN     - continue home regimen  Chronic HFrEF     - continue home regimen  Chronic pain     - avoid narcotics  pAfib     - continue eliquis  CKD3b     - at baseline, follow  Ab pain Elevated LFTs     - check hepatitis panel     - CT ab/pelvis ordered for abdominal pain  Normocytic anemia  DVT prophylaxis: eliquis Code Status: FULL  Family Communication: None at bedside  Consults called: None  Status is: Observation  The patient remains OBS appropriate and will d/c before 2 midnights.  Dispo: The patient is from: Home              Anticipated d/c is to: Home              Anticipated d/c date is: 1 day              Patient currently is not medically stable to d/c.   Difficult to place patient No  Jonnie Finner DO [facility]  If 7PM-7AM, please contact night-coverage [email or web address]  03/29/2020, 8:34 AM

## 2020-03-29 NOTE — ED Provider Notes (Signed)
1:35 AM Care assumed at shift change from [place], [place].  In short, patient is a 55 year old female with a history of diabetes and gastroparesis who presents to the emergency department for nausea, vomiting, abdominal pain. Reports that symptoms feel similar to past gastroparesis flares.  Patient has been requesting IV Dilaudid since ED arrival. States this medication is always given to her IV for symptom control. I had a long discussion with the patient about the use of narcotics for the management of gastroparesis and their contraindication.   Unfortunately, this is a difficult patient as she was started on a pain regimen consistent of daily Dilaudid for management of chronic pain. In speaking with the patient, it seems this was done through pain management. The patient has seen GI at [facility], but this appears isolated to NAFLD. According to GI consult in our system from 01/2020, Dr. Michail Sermon notates that "Diabetic gastroparesis that will not improve as long as she remains on narcotic pain meds. She refuses to accept that Dilaudid will worsen her gastroparesis symptoms."  Patient reports taking 3 of her oral Dilaudid tablets before coming to the ED and attempt to achieve symptom management; however, she was unable to tolerate these due to persistent vomiting.  She has not had any vomiting in the ED since receiving her first dose of Zofran. Does continue to complain of persistent nausea and a sensation of indigestion. Given that the patient is prescribed chronic narcotics, will give a dose of her ORAL Dilaudid as PO trial to see if she is able to tolerate this medication. If unable to tolerate PO, feel continued observation in the hospital is reasonable. Her laboratory work up today is generally reassuring and c/w baseline. She has no hypokalemia as seen in the past.  3:30 AM Patient with persistent pain and vomiting. Unable to tolerate PO fluids. Feels she necessitates admission for continued symptom  management. Case discussed with Dr. Alcario Drought. AM hospitalist team to assess for admission.   Antonietta Breach, PA-C 14/23/95 [id number]    Delora Fuel, MD 33/43/56 [phone]

## 2020-03-29 NOTE — ED Notes (Signed)
Patient next for CT, then will transport to the floor.

## 2020-03-29 NOTE — ED Notes (Addendum)
Patient upset about getting tylenol, requesting dilaudid. Patient is requesting to see the doctor, not the PA. PA made aware.

## 2020-03-30 DIAGNOSIS — R112 Nausea with vomiting, unspecified: Secondary | ICD-10-CM

## 2020-03-30 LAB — CBC
HCT: 33.3 % — ABNORMAL LOW (ref 36.0–46.0)
Hemoglobin: 10.1 g/dL — ABNORMAL LOW (ref 12.0–15.0)
MCH: 28 pg (ref 26.0–34.0)
MCHC: 30.3 g/dL (ref 30.0–36.0)
MCV: 92.2 fL (ref 80.0–100.0)
Platelets: 402 10*3/uL — ABNORMAL HIGH (ref 150–400)
RBC: 3.61 MIL/uL — ABNORMAL LOW (ref 3.87–5.11)
RDW: 14.5 % (ref 11.5–15.5)
WBC: 11.2 10*3/uL — ABNORMAL HIGH (ref 4.0–10.5)
nRBC: 0 % (ref 0.0–0.2)

## 2020-03-30 LAB — COMPREHENSIVE METABOLIC PANEL
ALT: 65 U/L — ABNORMAL HIGH (ref 0–44)
AST: 47 U/L — ABNORMAL HIGH (ref 15–41)
Albumin: 3.7 g/dL (ref 3.5–5.0)
Alkaline Phosphatase: 111 U/L (ref 38–126)
Anion gap: 13 (ref 5–15)
BUN: 15 mg/dL (ref 6–20)
CO2: 23 mmol/L (ref 22–32)
Calcium: 8.5 mg/dL — ABNORMAL LOW (ref 8.9–10.3)
Chloride: 106 mmol/L (ref 98–111)
Creatinine, Ser: 1.58 mg/dL — ABNORMAL HIGH (ref 0.44–1.00)
GFR, Estimated: 38 mL/min — ABNORMAL LOW (ref >60)
Glucose, Bld: 290 mg/dL — ABNORMAL HIGH (ref 70–99)
Potassium: 3.5 mmol/L (ref 3.5–5.1)
Sodium: 142 mmol/L (ref 135–145)
Total Bilirubin: 0.8 mg/dL (ref 0.3–1.2)
Total Protein: 7.8 g/dL (ref 6.5–8.1)

## 2020-03-30 LAB — URINALYSIS, ROUTINE W REFLEX MICROSCOPIC
Bilirubin Urine: NEGATIVE
Glucose, UA: >=500 mg/dL — AB
Ketones, ur: NEGATIVE mg/dL
Nitrite: NEGATIVE
Protein, ur: 100 mg/dL — AB
Specific Gravity, Urine: 1.012 (ref 1.005–1.030)
pH: 6 (ref 5.0–8.0)

## 2020-03-30 LAB — GLUCOSE, CAPILLARY
Glucose-Capillary: 246 mg/dL — ABNORMAL HIGH (ref 70–99)
Glucose-Capillary: 272 mg/dL — ABNORMAL HIGH (ref 70–99)

## 2020-03-30 MED ORDER — HYOSCYAMINE SULFATE 0.125 MG/ML PO SOLN: 0.2500 mg | Freq: Four times a day (QID) | ORAL | Status: DC | PRN

## 2020-03-30 MED ORDER — HYDROMORPHONE HCL 2 MG/ML IJ SOLN
2.0000 mg | Freq: Once | INTRAMUSCULAR | Status: AC
  Administered 2020-03-30: 2 mg via INTRAVENOUS
  Filled 2020-03-30: qty 1

## 2020-03-30 MED ORDER — HYDROMORPHONE HCL 2 MG PO TABS: 2.0000 mg | ORAL_TABLET | Freq: Every day | ORAL | 0 refills | Status: DC | PRN

## 2020-03-30 MED ORDER — ONDANSETRON HCL 4 MG PO TABS: 4.0000 mg | ORAL_TABLET | Freq: Four times a day (QID) | ORAL | 0 refills | Status: DC | PRN

## 2020-03-30 MED ORDER — HYOSCYAMINE SULFATE 0.125 MG SL SUBL
0.2500 mg | SUBLINGUAL_TABLET | Freq: Four times a day (QID) | SUBLINGUAL | Status: DC | PRN
  Administered 2020-03-30: 0.25 mg via SUBLINGUAL
  Filled 2020-03-30 (×2): qty 2

## 2020-03-30 NOTE — Discharge Summary (Signed)
Physician Discharge Summary  DEMETRI KERMAN LPN:[id number] DOB: 12-30-1965 DOA: 03/28/2020  PCP: Cleophas Dunker, DO  Admit date: 03/28/2020 Discharge date: 03/30/2020  Time spent: 50* minutes  Recommendations for Outpatient Follow-up:  1. Follow-up PCP in 2 weeks   Discharge Diagnoses:  Active Problems:   Diabetic gastroparesis (HCC)   Intractable nausea and vomiting   Discharge Condition: Stable  Diet recommendation: Carb modified diet  Filed Weights   03/29/20 1316 03/30/20 0530  Weight: (!) 144.2 kg (!) 144 kg    History of present illness:  55 year old female with a medical history of chronic narcotic use, diabetes mellitus type 2, hypertension, gastroparesis presented with nausea vomiting abdominal pain.  Patient said the symptom has been going on for past 2 days.  Patient says that she takes Dilaudid 2 mg daily at home and she ran out of Dilaudid for last few days.  She has an appointment to see her pain management physician on 27 January.  Hospital Course:  Diabetic gastroparesis-patient was started on Compazine, dronabinol, Ativan.  She continues to have abdominal pain.  Will give 1 dose of Dilaudid 2 mg IV x1.  Patient states that she tolerated her diet well and can go home.  She only had 1 episode of vomiting yesterday.  Denies diarrhea.  Will discharge patient on Dilaudid 2 mg daily as needed.  We will give her 5 pills.  Patient has an appointment to see her pain management specialist on 27th January.  Hypertension-blood pressures controlled.  Chronic pain syndrome-continue taking Dilaudid 2 mg daily as needed for pain.  Paroxysmal atrial fibrillation-heart rate is controlled, continue Eliquis.  CKD stage IIIb-currently at baseline.  Abdominal plain/elevated LFTs-improved from yesterday.  Likely from from hepatic steatosis.  Procedures:    Consultations:    Discharge Exam: Vitals:   03/29/20 2113 03/30/20 0530  BP: (!) 163/94 (!) 166/98   Pulse: (!) 109 (!) 106  Resp: 16 18  Temp: 98.2 F (36.8 C) 98.5 F (36.9 C)  SpO2: 96% 99%    General: Appears in no acute distress Cardiovascular: S1-S2, regular, no murmur auscultated Respiratory: Clear to auscultation bilaterally  Discharge Instructions   Discharge Instructions    Diet - low sodium heart healthy   Complete by: As directed    Increase activity slowly   Complete by: As directed      Allergies as of 03/30/2020      Reactions   Diazepam [name]   Isovue [iopamidol] Anaphylaxis   11/28/17 Patient had seizure like activity and then 1 min code after 100 cc of isovue 300. Possible contrast allergy vs vasovagal episode   Lisinopril Anaphylaxis   Tongue and mouth swelling   Metoclopramide Other (See Comments)   Tardive dyskinesia   Penicillins Palpitations   Has patient had a PCN reaction causing immediate rash, facial/tongue/throat swelling, SOB or lightheadedness with hypotension: Yes, heart races Has patient had a PCN reaction causing severe rash involving mucus membranes or skin necrosis: No Has patient had a PCN reaction that required hospitalization: Yes  Has patient had a PCN reaction occurring within the last 10 years: No   Tolmetin Nausea Only, Other (See Comments)   ULCER   Aspirin Other (See Comments)   Irritates stomach ulcer    Nsaids Other (See Comments)   ULCER   Acetaminophen Nausea Only, Other (See Comments)   Irritates stomach ulcer; Abdominal pain   Tramadol Nausea And Vomiting      Medication List  STOP taking these medications   cetirizine 10 MG tablet Commonly known as: ZYRTEC     TAKE these medications   albuterol (2.5 MG/3ML) 0.083% nebulizer solution Commonly known as: PROVENTIL Take 3 mLs (2.5 mg total) by nebulization every 6 (six) hours as needed for wheezing or shortness of breath.   allopurinol 100 MG tablet Commonly known as: ZYLOPRIM Take 100 mg by mouth daily.   ALPRAZolam 1 MG tablet Commonly  known as: XANAX Take 1 tablet (1 mg total) by mouth 2 (two) times daily as needed for anxiety.   atorvastatin 10 MG tablet Commonly known as: LIPITOR TAKE ONE TABLET BY MOUTH ONCE DAILY (AM) What changed: See the new instructions.   blood glucose meter kit and supplies Kit Dispense based on patient and insurance preference. Use up to four times daily as directed. (FOR ICD-9 250.00, 250.01).   colchicine 0.6 MG tablet Take 2 tablets at 1 time (1.2 mg total) and 1 hour later take 1 tablet What changed:   how much to take  how to take this  when to take this  additional instructions   DULoxetine HCl 40 MG Cpep Take 40 mg by mouth in the morning and at bedtime.   Easy Comfort Pen Needles 31G X 5 MM Misc Generic drug: Insulin Pen Needle USE 3 TIMES A DAY FOR INSULIN ADMINISTRATION   Eliquis 5 MG Tabs tablet Generic drug: apixaban TAKE 1 TABLET BY MOUTH 2 (TWO) TIMES DAILY. What changed: See the new instructions.   Farxiga 10 MG Tabs tablet Generic drug: dapagliflozin propanediol Take 10 mg by mouth daily.   ferrous sulfate 325 (65 FE) MG tablet Take 1 tablet (325 mg total) by mouth daily with breakfast.   fluticasone 50 MCG/ACT nasal spray Commonly known as: FLONASE Place 2 sprays into both nostrils daily as needed for allergies or rhinitis.   hydrALAZINE 25 MG tablet Commonly known as: APRESOLINE TAKE 1 TABLET BY MOUTH 3 (THREE) TIMES DAILY. FOR BLOOD PRESSURE What changed: See the new instructions.   HYDROmorphone 2 MG tablet Commonly known as: DILAUDID Take 1 tablet (2 mg total) by mouth daily as needed for severe pain.   hydrOXYzine 10 MG tablet Commonly known as: ATARAX/VISTARIL Take 1 tablet (10 mg total) by mouth 3 (three) times daily as needed.   isosorbide mononitrate 60 MG 24 hr tablet Commonly known as: IMDUR TAKE 1 TABLET (60 MG TOTAL) BY MOUTH DAILY.   Lantus SoloStar 100 UNIT/ML Solostar Pen Generic drug: insulin glargine Inject 32 Units into  the skin at bedtime. What changed: how much to take   melatonin 3 MG Tabs tablet Take 2 tablets (6 mg total) by mouth at bedtime.   metoprolol succinate 25 MG 24 hr tablet Commonly known as: TOPROL-XL Take 1 tablet (25 mg total) by mouth daily.   nitroGLYCERIN 0.4 MG SL tablet Commonly known as: NITROSTAT Place 1 tablet (0.4 mg total) under the tongue every 5 (five) minutes as needed for chest pain. What changed: reasons to take this   NovoLOG FlexPen 100 UNIT/ML FlexPen Generic drug: insulin aspart Inject 30 Units into the skin in the morning, at noon, in the evening, and at bedtime.   ondansetron 4 MG tablet Commonly known as: ZOFRAN Take 1 tablet (4 mg total) by mouth every 6 (six) hours as needed for nausea.   pantoprazole 40 MG tablet Commonly known as: PROTONIX TAKE ONE TABLET BY MOUTH TWICE DAILY   torsemide 20 MG tablet Commonly known as: DEMADEX TAKE  2 TABLETS (40 MG TOTAL) BY MOUTH DAILY.      Allergies  Allergen Reactions  . Diazepam [name]  . Isovue [Iopamidol] Anaphylaxis    11/28/17 Patient had seizure like activity and then 1 min code after 100 cc of isovue 300. Possible contrast allergy vs vasovagal episode  . Lisinopril Anaphylaxis    Tongue and mouth swelling  . Metoclopramide Other (See Comments)    Tardive dyskinesia  . Penicillins Palpitations    Has patient had a PCN reaction causing immediate rash, facial/tongue/throat swelling, SOB or lightheadedness with hypotension: Yes, heart races Has patient had a PCN reaction causing severe rash involving mucus membranes or skin necrosis: No Has patient had a PCN reaction that required hospitalization: Yes  Has patient had a PCN reaction occurring within the last 10 years: No   . Tolmetin Nausea Only and Other (See Comments)    ULCER  . Aspirin Other (See Comments)    Irritates stomach ulcer   . Nsaids Other (See Comments)    ULCER  . Acetaminophen Nausea Only and Other (See Comments)     Irritates stomach ulcer; Abdominal pain  . Tramadol Nausea And Vomiting      The results of significant diagnostics from this hospitalization (including imaging, microbiology, ancillary and laboratory) are listed below for reference.    Significant Diagnostic Studies: CT ABDOMEN PELVIS WO CONTRAST  Result Date: 03/29/2020 CLINICAL DATA:  Nausea and vomiting. EXAM: CT ABDOMEN AND PELVIS WITHOUT CONTRAST TECHNIQUE: Multidetector CT imaging of the abdomen and pelvis was performed following the standard protocol without IV contrast. COMPARISON:  CT abdomen pelvis 01/11/2020 FINDINGS: Lower chest: Normal heart size. Dependent atelectasis bilateral lower lobes. No pleural effusion. Hepatobiliary: Liver is diffusely low in attenuation compatible with steatosis. Prior cholecystectomy. Pancreas: Unremarkable Spleen: Unremarkable Adrenals/Urinary Tract: Normal adrenal glands. Kidneys are symmetric in size. No hydronephrosis. Urinary bladder is unremarkable. Stomach/Bowel: Normal morphology of the stomach. No evidence for bowel obstruction, bowel wall thickening. No free fluid or free intraperitoneal air. Vascular/Lymphatic: Normal caliber abdominal aorta. Peripheral calcified atherosclerotic plaque. No retroperitoneal lymphadenopathy. Reproductive: Redemonstrated exophytic fibroid off the uterine fundus. Adnexal structures unremarkable. Other: None. Musculoskeletal: Lower thoracic and lumbar spine degenerative changes. IMPRESSION: 1. No acute process within the abdomen or pelvis. 2. Hepatic steatosis. Electronically Signed   By: Lovey Newcomer M.D.   On: 03/29/2020 12:49    Microbiology: Recent Results (from the past 240 hour(s))  SARS CORONAVIRUS 2 (TAT 6-24 HRS) Nasopharyngeal Nasopharyngeal Swab     Status: None   Collection Time: 03/29/20  4:09 AM   Specimen: Nasopharyngeal Swab  Result Value Ref Range Status   SARS Coronavirus 2 NEGATIVE NEGATIVE Final    Comment: (NOTE) SARS-CoV-2 target nucleic acids  are NOT DETECTED.  The SARS-CoV-2 RNA is generally detectable in upper and lower respiratory specimens during the acute phase of infection. Negative results do not preclude SARS-CoV-2 infection, do not rule out co-infections with other pathogens, and should not be used as the sole basis for treatment or other patient management decisions. Negative results must be combined with clinical observations, patient history, and epidemiological information. The expected result is Negative.  Fact Sheet for Patients: SugarRoll.be  Fact Sheet for Healthcare Providers: https://www.woods-mathews.com/  This test is not yet approved or cleared by the [place] FDA and  has been authorized for detection and/or diagnosis of SARS-CoV-2 by FDA under an Emergency Use Authorization (EUA). This EUA will remain  in effect (meaning this test can be used)  for the duration of the COVID-19 declaration under Se ction 564(b)(1) of the Act, 21 U.S.C. section 360bbb-3(b)(1), unless the authorization is terminated or revoked sooner.  Performed at [facility], [place] [address] [place], [place] [address]      Labs: Basic Metabolic Panel: Recent Labs  Lab 03/28/20 2152 03/30/20 0605  NA 136 142  K 4.3 3.5  CL 100 106  CO2 23 23  GLUCOSE 427* 290*  BUN 18 15  CREATININE 1.64* 1.58*  CALCIUM 8.8* 8.5*   Liver Function Tests: Recent Labs  Lab 03/28/20 2152 03/30/20 0605  AST 53* 47*  ALT 71* 65*  ALKPHOS 112 111  BILITOT 1.1 0.8  PROT 7.6 7.8  ALBUMIN 3.2* 3.7   Recent Labs  Lab 03/28/20 2152  LIPASE 19   No results for input(s): AMMONIA in the last 168 hours. CBC: Recent Labs  Lab 03/28/20 2152 03/30/20 0605  WBC 11.4* 11.2*  NEUTROABS 9.6*  --   HGB 9.7* 10.1*  HCT 31.5* 33.3*  MCV 90.3 92.2  PLT 411* 402*   Cardiac Enzymes: No results for input(s): CKTOTAL, CKMB, CKMBINDEX, TROPONINI in the last 168 hours. BNP: BNP (last 3  results) Recent Labs    08/19/19 1619 01/08/20 1220 01/23/20 1315  BNP 60.8 250.9* 165.0*    ProBNP (last 3 results) No results for input(s): PROBNP in the last 8760 hours.  CBG: Recent Labs  Lab 03/29/20 0008 03/29/20 1323 03/29/20 1803 03/29/20 2115 03/30/20 0747  GLUCAP 327* 201* 294* 272* 246*       Signed:  Oswald Hillock MD.  [facility] 03/30/2020, 11:15 AM

## 2020-03-30 NOTE — Progress Notes (Signed)
D/C instructions given to patient. Patient had no questions. NT or writer will wheel patient out once her ride is here

## 2020-03-31 NOTE — Chronic Care Management (AMB) (Signed)
  Care Management   Note  03/31/2020 Name: Deborah [name] MRN: [id number] DOB: 1965/06/27  Deborah [name] is a 55 y.o. year old female who is a primary care patient of [place], Bernita Raisin, DO and is actively engaged with the care management team. I reached out to Deborah [name] by phone today to assist with re-scheduling a follow up visit with the RN Case Manager.  Follow up plan: Unsuccessful telephone outreach attempt made. A HIPAA compliant phone message was left for the patient providing contact information and requesting a return call. Unable to make contact on outreach attempts x 3. PCP [place], Bernita Raisin, DO notified via routed documentation in medical record. We have been unable to make contact with the patient for follow up. The care management team is available to follow up with the patient after provider conversation with the patient regarding recommendation for care management engagement and subsequent re-referral to the care management team. If patient returns call to provider office, please advise to call [place] at [phone].  [place] [facility]

## 2020-04-03 ENCOUNTER — Encounter: Payer: Medicare Other | Admitting: Physical Medicine and Rehabilitation

## 2020-05-05 ENCOUNTER — Encounter: Payer: Self-pay | Admitting: Physical Medicine and Rehabilitation

## 2020-05-05 ENCOUNTER — Other Ambulatory Visit: Payer: Self-pay

## 2020-05-05 ENCOUNTER — Telehealth: Payer: Self-pay | Admitting: *Deleted

## 2020-05-05 ENCOUNTER — Encounter
Payer: Medicare Other | Attending: Physical Medicine and Rehabilitation | Admitting: Physical Medicine and Rehabilitation

## 2020-05-05 VITALS — BP 134/82 | HR 100 | Temp 97.7°F | Ht 66.0 in | Wt 317.0 lb

## 2020-05-05 DIAGNOSIS — C7A010 Malignant carcinoid tumor of the duodenum: Secondary | ICD-10-CM | POA: Diagnosis not present

## 2020-05-05 DIAGNOSIS — G894 Chronic pain syndrome: Secondary | ICD-10-CM | POA: Diagnosis not present

## 2020-05-05 DIAGNOSIS — F4321 Adjustment disorder with depressed mood: Secondary | ICD-10-CM

## 2020-05-05 MED ORDER — FLUCONAZOLE 50 MG PO TABS: 50.0000 mg | ORAL_TABLET | Freq: Every day | ORAL | 0 refills | Status: DC

## 2020-05-05 MED ORDER — HYDROMORPHONE HCL 2 MG PO TABS: 2.0000 mg | ORAL_TABLET | Freq: Every day | ORAL | 0 refills | Status: DC | PRN

## 2020-05-05 MED ORDER — ONDANSETRON HCL 4 MG PO TABS: 4.0000 mg | ORAL_TABLET | Freq: Four times a day (QID) | ORAL | 0 refills | Status: DC | PRN

## 2020-05-05 NOTE — Patient Instructions (Signed)
-  Discussed following foods that may reduce pain: 1) Ginger 2) Blueberries 3) Salmon 4) Pumpkin seeds 5) dark chocolate 6) turmeric 7) tart cherries 8) virgin olive oil 9) chilli peppers 10) mint 11) red wine 12) garlic  Link to further information on diet for chronic pain: [email or web address]/[email or web address]-pain  

## 2020-05-05 NOTE — Telephone Encounter (Signed)
Dr Ranell Patrick meant to give her #30. I spoke with the pharmacist and he says that she can pick up the new Rx 2 days and it will be for a full 30 days from that time.

## 2020-05-05 NOTE — Telephone Encounter (Signed)
Deborah [name] called to make sure Dr Ranell Patrick meant to only give her #5 dilaudid for a month Rx.  She wonders what she is [place] do with that?

## 2020-05-05 NOTE — Progress Notes (Signed)
Subjective:    Patient ID: Deborah [name], female    DOB: 02-19-1966, 55 y.o.   MRN: [id number]  HPI  Deborah [name] is a 55 year old woman who presents for follow-up of severe cancer-related abdominal pain.   1) Grief: -Her sister passed away recently. This was the last of her siblings to pass. She is tearful about this and would like to speak with a psychologist. She died of COPD and she feels that now she is in a better place.   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 Severe pain may be related to scar tissue or adhesions. -Has been in and out of the hospital because of her pain.  -She missed several appointments here due to hospitalization so was unable to receive medication from our clinic. It has been hard for her to get appointment here due to limited openings.  -Discussed following up with [name] NP one month and me the following month to help prevent this from happening again. -Pain is 10/10 right now and on average and feels constant and sharp.  -Pain affects her general activity, relation with others, and enjoyment of life severely.  3) Nausea: -she is only able to tolerate mashed potatoes and boiled eggs. -The Zofran tablet really helped- the sublingual film did not.      Pain Inventory Average Pain 10 Pain Right Now 10 My pain is constant and sharp  In the last 24 hours, has pain interfered with the following? General activity 10 Relation with others 10 Enjoyment of life 10 What TIME of day is your pain at its worst? morning , daytime, evening and night Sleep (in general) Poor  Pain is worse with: walking, bending, sitting, standing and some activites Pain improves with: medication Relief from Meds: 10  Family History  Problem Relation Age of Onset  . Diabetes Mother   . Diabetes Father   . Heart disease Father   . Diabetes Sister   . Congestive Heart Failure Sister 20   . Diabetes Brother    Social History   Socioeconomic History  . Marital status: Single    Spouse name: Not on file  . Number of children: Not on file  . Years of education: Not on file  . Highest education level: Not on file  Occupational History  . Not on file  Tobacco Use  . Smoking status: Never Smoker  . Smokeless tobacco: Never Used  Vaping Use  . Vaping Use: Never used  Substance and Sexual Activity  . Alcohol use: No  . Drug use: No  . Sexual activity: Not Currently    Birth control/protection: None  Other Topics Concern  . Not on file  Social History Narrative   ** Merged History Encounter **       Social Determinants of Health   Financial Resource Strain: Not on file  Food Insecurity: Not on file  Transportation Needs: Not on file  Physical Activity: Not on file  Stress: Not on file  Social Connections: Not on file   Past Surgical History:  Procedure Laterality Date  . BIOPSY  07/27/2019   Procedure: BIOPSY;  Surgeon: Clarene Essex, MD;  Location: [facility];  Service: Endoscopy;;  . BIOPSY  07/30/2019   Procedure: BIOPSY;  Surgeon: Otis Brace, MD;  Location: [facility];  Service: Gastroenterology;;  . CATARACT EXTRACTION  01/2014  . CHOLECYSTECTOMY    . COLONOSCOPY WITH PROPOFOL N/A  07/30/2019   Procedure: COLONOSCOPY WITH PROPOFOL;  Surgeon: Otis Brace, MD;  Location: [facility];  Service: Gastroenterology;  Laterality: N/A;  . ESOPHAGOGASTRODUODENOSCOPY N/A 07/27/2019   Procedure: ESOPHAGOGASTRODUODENOSCOPY (EGD);  Surgeon: Clarene Essex, MD;  Location: [facility];  Service: Endoscopy;  Laterality: N/A;  . ESOPHAGOGASTRODUODENOSCOPY (EGD) WITH PROPOFOL N/A 08/02/2019   Procedure: ESOPHAGOGASTRODUODENOSCOPY (EGD) WITH PROPOFOL;  Surgeon: Otis Brace, MD;  Location: [facility];  Service: Gastroenterology;  Laterality: N/A;  . HEMOSTASIS CLIP PLACEMENT  08/02/2019   Procedure: HEMOSTASIS CLIP PLACEMENT;  Surgeon: Otis Brace,  MD;  Location: [facility];  Service: Gastroenterology;;  . POLYPECTOMY  07/30/2019   Procedure: POLYPECTOMY;  Surgeon: Otis Brace, MD;  Location: [facility];  Service: Gastroenterology;;  . POLYPECTOMY  08/02/2019   Procedure: POLYPECTOMY;  Surgeon: Otis Brace, MD;  Location: [facility];  Service: Gastroenterology;;   Past Surgical History:  Procedure Laterality Date  . BIOPSY  07/27/2019   Procedure: BIOPSY;  Surgeon: Clarene Essex, MD;  Location: [facility];  Service: Endoscopy;;  . BIOPSY  07/30/2019   Procedure: BIOPSY;  Surgeon: Otis Brace, MD;  Location: [facility];  Service: Gastroenterology;;  . CATARACT EXTRACTION  01/2014  . CHOLECYSTECTOMY    . COLONOSCOPY WITH PROPOFOL N/A 07/30/2019   Procedure: COLONOSCOPY WITH PROPOFOL;  Surgeon: Otis Brace, MD;  Location: [facility];  Service: Gastroenterology;  Laterality: N/A;  . ESOPHAGOGASTRODUODENOSCOPY N/A 07/27/2019   Procedure: ESOPHAGOGASTRODUODENOSCOPY (EGD);  Surgeon: Clarene Essex, MD;  Location: [facility];  Service: Endoscopy;  Laterality: N/A;  . ESOPHAGOGASTRODUODENOSCOPY (EGD) WITH PROPOFOL N/A 08/02/2019   Procedure: ESOPHAGOGASTRODUODENOSCOPY (EGD) WITH PROPOFOL;  Surgeon: Otis Brace, MD;  Location: [facility];  Service: Gastroenterology;  Laterality: N/A;  . HEMOSTASIS CLIP PLACEMENT  08/02/2019   Procedure: HEMOSTASIS CLIP PLACEMENT;  Surgeon: Otis Brace, MD;  Location: [facility];  Service: Gastroenterology;;  . POLYPECTOMY  07/30/2019   Procedure: POLYPECTOMY;  Surgeon: Otis Brace, MD;  Location: [facility];  Service: Gastroenterology;;  . POLYPECTOMY  08/02/2019   Procedure: POLYPECTOMY;  Surgeon: Otis Brace, MD;  Location: [facility];  Service: Gastroenterology;;   Past Medical History:  Diagnosis Date  . Acute back pain with sciatica, left   . Acute back pain with sciatica, right   . AKI (acute kidney injury) ([place])   . Anemia, unspecified   .  Cancer ([place])   . Carcinoid tumor of duodenum   . Chronic pain   . Chronic systolic CHF (congestive heart failure) ([place])   . Diabetes mellitus   . Esophageal reflux   . Fibromyalgia   . Gastric ulcer   . Gastroparesis   . Gout   . Hyperlipidemia   . Hypertension   . Lumbosacral stenosis   . NICM (nonischemic cardiomyopathy) ([place])   . Obesity   . PAF (paroxysmal atrial fibrillation) ([place])   . Stroke ([place]) 02/2011  . Vitamin B12 deficiency anemia    LMP 10/10/2012   Opioid Risk Score:   Fall Risk Score:  `1  Depression screen PHQ 2/9  Depression screen [facility] 2/9 01/04/2020 11/15/2019 11/14/2019 10/10/2019 09/20/2019 09/03/2019 06/04/2019  Decreased Interest 2 3 1 3 3  0 3  Down, Depressed, Hopeless 3 3 1 3 3 3 3   PHQ - 2 Score 5 6 2 6 6 3 6   Altered sleeping 3 3 - 3 3 - -  Tired, decreased energy 3 3 - 3 3 - -  Change in appetite 3 3 - 3 3 - -  Feeling bad or failure about yourself  3 3 - 2 2 - -  Trouble concentrating 3 3 - 3 3 - -  Moving slowly or fidgety/restless 3 3 - 3 1 - -  Suicidal thoughts 0 3 - 0 1 - -  PHQ-9 Score 23 27 - 23 - - -  Difficult doing work/chores - - - - Very difficult - -  Some recent data might be hidden    Review of Systems  Gastrointestinal: Positive for abdominal pain.  All other systems reviewed and are negative.      Objective:   Physical Exam Gen: no distress, normal appearing, obese HEENT: oral mucosa pink and moist, NCAT Cardio: Reg rate Chest: normal effort, normal rate of breathing Abd: soft, non-distended, tenderness to palpation over bilateral lower quadrants Ext: no edema Psych: pleasant, normal affect, tearful when discussing her sister's passing Skin: intact Neuro: Alert and oriented x3. Musculoskeletal: Transported in wheelchair.      Assessment & Plan:  Deborah [name] is a 55 year old woman who presents for follow-up of the following medical conditions:  1)Chronic Pain Syndrome secondary to malignant carcinoid tumor of the  duodenum.  -PDMP reviewed. Had one script for 5 tablets from another provider when we were unable to prescribe when she was unable to come in for appointments.  -Provided refill of Dilaudid 2mg  PRN daily for severe cancer-related pain prescribed. -Discussed current symptoms of pain and history of pain.  -Discussed benefits of exercise in reducing pain. -Advised to stop Powerade and Gatorade- she thought these are good for her, discussed it is best to avoid sugar and sugar drinks due to increased inflammation.  -Discussed following foods that may reduce pain: 1) Ginger 2) Blueberries 3) Salmon 4) Pumpkin seeds 5) dark chocolate 6) turmeric 7) tart cherries 8) virgin olive oil 9) chilli peppers 10) mint 11) garlic  Link to further information on diet for chronic pain: http://www.randall.com/   2) Emesis secondary to malignant carcinoid tumor of the duodenum.  -Zofran refilled -Recommended ginger -She is only able to tolerate limited foods: eggs, mashed potatoes. Advised that these are at least nutritious and to choose nutritious foods whenever possible.   3) Grief: -Provided emotional support and referred to neuropsych  .

## 2020-05-06 ENCOUNTER — Other Ambulatory Visit: Payer: Self-pay | Admitting: Family Medicine

## 2020-05-25 ENCOUNTER — Emergency Department (HOSPITAL_COMMUNITY): Payer: Medicare Other

## 2020-05-25 ENCOUNTER — Emergency Department (HOSPITAL_COMMUNITY)
Admission: EM | Admit: 2020-05-25 | Discharge: 2020-05-25 | Disposition: A | Discharge status: Home / Self Care | Payer: Medicare Other | Attending: Emergency Medicine | Admitting: Emergency Medicine

## 2020-05-25 ENCOUNTER — Other Ambulatory Visit: Payer: Self-pay

## 2020-05-25 DIAGNOSIS — E1165 Type 2 diabetes mellitus with hyperglycemia: Secondary | ICD-10-CM | POA: Diagnosis not present

## 2020-05-25 DIAGNOSIS — J441 Chronic obstructive pulmonary disease with (acute) exacerbation: Secondary | ICD-10-CM | POA: Insufficient documentation

## 2020-05-25 DIAGNOSIS — Z85060 Personal history of malignant carcinoid tumor of small intestine: Secondary | ICD-10-CM | POA: Diagnosis not present

## 2020-05-25 DIAGNOSIS — R072 Precordial pain: Secondary | ICD-10-CM | POA: Insufficient documentation

## 2020-05-25 DIAGNOSIS — E876 Hypokalemia: Secondary | ICD-10-CM | POA: Diagnosis not present

## 2020-05-25 DIAGNOSIS — I130 Hypertensive heart and chronic kidney disease with heart failure and stage 1 through stage 4 chronic kidney disease, or unspecified chronic kidney disease: Secondary | ICD-10-CM | POA: Insufficient documentation

## 2020-05-25 DIAGNOSIS — I5022 Chronic systolic (congestive) heart failure: Secondary | ICD-10-CM | POA: Diagnosis not present

## 2020-05-25 DIAGNOSIS — R0602 Shortness of breath: Secondary | ICD-10-CM | POA: Diagnosis not present

## 2020-05-25 DIAGNOSIS — Z79899 Other long term (current) drug therapy: Secondary | ICD-10-CM | POA: Diagnosis not present

## 2020-05-25 DIAGNOSIS — E1140 Type 2 diabetes mellitus with diabetic neuropathy, unspecified: Secondary | ICD-10-CM | POA: Diagnosis not present

## 2020-05-25 DIAGNOSIS — Z7901 Long term (current) use of anticoagulants: Secondary | ICD-10-CM | POA: Insufficient documentation

## 2020-05-25 DIAGNOSIS — E1110 Type 2 diabetes mellitus with ketoacidosis without coma: Secondary | ICD-10-CM | POA: Diagnosis not present

## 2020-05-25 DIAGNOSIS — K219 Gastro-esophageal reflux disease without esophagitis: Secondary | ICD-10-CM | POA: Diagnosis not present

## 2020-05-25 DIAGNOSIS — I480 Paroxysmal atrial fibrillation: Secondary | ICD-10-CM | POA: Insufficient documentation

## 2020-05-25 DIAGNOSIS — N182 Chronic kidney disease, stage 2 (mild): Secondary | ICD-10-CM | POA: Insufficient documentation

## 2020-05-25 DIAGNOSIS — Z794 Long term (current) use of insulin: Secondary | ICD-10-CM | POA: Diagnosis not present

## 2020-05-25 DIAGNOSIS — E1143 Type 2 diabetes mellitus with diabetic autonomic (poly)neuropathy: Secondary | ICD-10-CM | POA: Diagnosis not present

## 2020-05-25 DIAGNOSIS — R079 Chest pain, unspecified: Secondary | ICD-10-CM

## 2020-05-25 DIAGNOSIS — R1084 Generalized abdominal pain: Secondary | ICD-10-CM | POA: Diagnosis present

## 2020-05-25 DIAGNOSIS — K3184 Gastroparesis: Secondary | ICD-10-CM

## 2020-05-25 DIAGNOSIS — E860 Dehydration: Secondary | ICD-10-CM | POA: Diagnosis not present

## 2020-05-25 LAB — COMPREHENSIVE METABOLIC PANEL
ALT: 10 U/L (ref 0–44)
AST: 18 U/L (ref 15–41)
Albumin: 4 g/dL (ref 3.5–5.0)
Alkaline Phosphatase: 82 U/L (ref 38–126)
Anion gap: 16 — ABNORMAL HIGH (ref 5–15)
BUN: 24 mg/dL — ABNORMAL HIGH (ref 6–20)
CO2: 18 mmol/L — ABNORMAL LOW (ref 22–32)
Calcium: 9.2 mg/dL (ref 8.9–10.3)
Chloride: 102 mmol/L (ref 98–111)
Creatinine, Ser: 2.1 mg/dL — ABNORMAL HIGH (ref 0.44–1.00)
GFR, Estimated: 27 mL/min — ABNORMAL LOW (ref >60)
Glucose, Bld: 199 mg/dL — ABNORMAL HIGH (ref 70–99)
Potassium: 2.8 mmol/L — ABNORMAL LOW (ref 3.5–5.1)
Sodium: 136 mmol/L (ref 135–145)
Total Bilirubin: 1.1 mg/dL (ref 0.3–1.2)
Total Protein: 8.8 g/dL — ABNORMAL HIGH (ref 6.5–8.1)

## 2020-05-25 LAB — CBC WITH DIFFERENTIAL/PLATELET
Abs Immature Granulocytes: 0.1 10*3/uL — ABNORMAL HIGH (ref 0.00–0.07)
Basophils Absolute: 0.1 10*3/uL (ref 0.0–0.1)
Basophils Relative: 1 %
Eosinophils Absolute: 0.2 10*3/uL (ref 0.0–0.5)
Eosinophils Relative: 1 %
HCT: 31.8 % — ABNORMAL LOW (ref 36.0–46.0)
Hemoglobin: 9.8 g/dL — ABNORMAL LOW (ref 12.0–15.0)
Immature Granulocytes: 1 %
Lymphocytes Relative: 25 %
Lymphs Abs: 3.3 10*3/uL (ref 0.7–4.0)
MCH: 27.9 pg (ref 26.0–34.0)
MCHC: 30.8 g/dL (ref 30.0–36.0)
MCV: 90.6 fL (ref 80.0–100.0)
Monocytes Absolute: 0.8 10*3/uL (ref 0.1–1.0)
Monocytes Relative: 6 %
Neutro Abs: 8.7 10*3/uL — ABNORMAL HIGH (ref 1.7–7.7)
Neutrophils Relative %: 66 %
Platelets: 550 10*3/uL — ABNORMAL HIGH (ref 150–400)
RBC: 3.51 MIL/uL — ABNORMAL LOW (ref 3.87–5.11)
RDW: 15.2 % (ref 11.5–15.5)
WBC: 13.2 10*3/uL — ABNORMAL HIGH (ref 4.0–10.5)
nRBC: 0 % (ref 0.0–0.2)

## 2020-05-25 LAB — BRAIN NATRIURETIC PEPTIDE: B Natriuretic Peptide: 51.2 pg/mL (ref 0.0–100.0)

## 2020-05-25 LAB — LIPASE, BLOOD: Lipase: 24 U/L (ref 11–51)

## 2020-05-25 LAB — CBG MONITORING, ED: Glucose-Capillary: 203 mg/dL — ABNORMAL HIGH (ref 70–99)

## 2020-05-25 LAB — TROPONIN I (HIGH SENSITIVITY): Troponin I (High Sensitivity): 32 ng/L — ABNORMAL HIGH (ref <18)

## 2020-05-25 MED ORDER — HYDROMORPHONE HCL 1 MG/ML IJ SOLN
0.5000 mg | Freq: Once | INTRAMUSCULAR | Status: AC
  Administered 2020-05-25: 0.5 mg via INTRAVENOUS
  Filled 2020-05-25: qty 1

## 2020-05-25 MED ORDER — POTASSIUM CHLORIDE ER 10 MEQ PO TBCR: 10.0000 meq | EXTENDED_RELEASE_TABLET | Freq: Every day | ORAL | 0 refills | Status: DC

## 2020-05-25 MED ORDER — POTASSIUM CHLORIDE 10 MEQ/100ML IV SOLN
10.0000 meq | INTRAVENOUS | Status: AC
  Administered 2020-05-25 (×2): 10 meq via INTRAVENOUS
  Filled 2020-05-25 (×2): qty 100

## 2020-05-25 MED ORDER — ASPIRIN 81 MG PO CHEW
324.0000 mg | CHEWABLE_TABLET | Freq: Once | ORAL | Status: DC
  Filled 2020-05-25: qty 4

## 2020-05-25 MED ORDER — SODIUM CHLORIDE 0.9 % IV BOLUS
1000.0000 mL | Freq: Once | INTRAVENOUS | Status: AC
  Administered 2020-05-25: 1000 mL via INTRAVENOUS

## 2020-05-25 MED ORDER — HYDROMORPHONE HCL 1 MG/ML IJ SOLN
1.0000 mg | Freq: Once | INTRAMUSCULAR | Status: AC
Start: 2020-05-25 — End: 2020-05-25
  Administered 2020-05-25: 1 mg via INTRAVENOUS
  Filled 2020-05-25: qty 1

## 2020-05-25 MED ORDER — ONDANSETRON HCL 4 MG/2ML IJ SOLN
4.0000 mg | Freq: Once | INTRAMUSCULAR | Status: AC
  Administered 2020-05-25: 4 mg via INTRAVENOUS
  Filled 2020-05-25: qty 2

## 2020-05-25 NOTE — ED Triage Notes (Signed)
Patient Deborah [name], patient reportedly went to PCP two days ago, pcp called today said creatinine was 3.6 and to come to ED, patient now has chest pain, left arm tingling. Patient had MI two weeks ago, was on nitro drip at [facility] two weeks ago. Patient reports chest pain of 10/10, shortness of breath.

## 2020-05-25 NOTE — Discharge Instructions (Addendum)
Your kidney function was 2.1 today which is very near your baseline. Continue drinking plenty of water to stay hydrated. Have your kidney function rechecked in 1 week by your PCP.   Continue taking your home oral dilaudid as needed for your pain. Use the zofran as needed.   Your potassium was slightly low today. Please pick up oral potassium supplements and take as prescribed. Your PCP will need to recheck your potassium levels as well.

## 2020-05-25 NOTE — ED Provider Notes (Cosign Needed Addendum)
[facility] DEPT Provider Note   [name]: [id number] Arrival date & time: 05/25/20  1023     History Chief Complaint  Patient presents with  . Chest Pain    Deborah [name] is a 55 y.o. female with PMHx HTN, HLD, CHF with EF 45-50%, Diabetes, CKD stage III (baseline 1.2-1.5 per previous hospitalization note at [facility]), Fibromyalgia, PAF on Eliquis, CVA who presents to the ED via EMS with multiple complaints. Pt reports she was called by her PCP today regarding her creatinine being "3.6" (from visit on Friday for hospital follow up) and was told to come to the ED. Per chart review pt's creatinine was 2.43 with PCP. Pt reports she has had continued substernal chest pain radiating to her left shoulder for the past 1-2 weeks. She reports it is always present but waxing and waning in nature with mild improvement with NTG. She took 2 NTG this morning around 6:30 AM with mild relief. Pt also reports shortness of breath, nausea, and NBNB emesis however is also having diffuse abdominal pain as well as diarrhea. PT has hx of gastroparesis but does not feel like this is similar.   Per chart review: Pt was admitted to [facility] on 03/12-3/15 after presenting for chest pain and abdominal pain. Presented with exertional chest pain and noted to have an elevated troponin that was down trending (90->74) with a delta of 16 in the setting known CKD. HEART score 7. Likely with microvascular disease of the heart but concern for underlying CAD in setting of CP which was responsive to NTG gtt. Echo showed an EF of > 55% with no WMA. In view of renal function, lack of prior CAD hx, intolerance to ASA, anaphylaxis to contrast allergy and gastroparesis and normal echo study attempted to obtain NM stress test first for risk stratification however study was incompleted due to intolerance (acute emesis). We discussed with the patient the options of trying to pursue a stress test again versus  treating this NSTEMI with a short course of plavix, and that given absence of current chest pain, normal cardiac function that she would likely end up being prescribed plavix regardless of the stress test results. She preferred to proceed with plavix. We also discussed the recommendations of the GI consultants for therapy of her gastroparesis, recommendations from the psychiatry team and findings from her CT abdomen/pelvis. Patient later escalated her concerns and claimed to not be informed about the treatment plan and lack of communication from the team and not addressing her needs. Despite our efforts to reengage in discussing the treatment plan patient decided to leave AMA. She refused plavix and was not interested in our recommendations or paperwork therefore no changes were made to her medication regimen. She was continued on her home Imdur, BB, Hydral, Atorvastatin ( consider decreasing to 40 mg if we keep using Erythromycin d/t drug/drug interaction. Per patient will consider alternate providers, possibly F/u with her pimary care team.     The history is provided by the patient, the EMS personnel and medical records.       Past Medical History:  Diagnosis Date  . Acute back pain with sciatica, left   . Acute back pain with sciatica, right   . AKI (acute kidney injury) ([place])   . Anemia, unspecified   . Cancer ([place])   . Carcinoid tumor of duodenum   . Chronic pain   . Chronic systolic CHF (congestive heart failure) ([place])   . Diabetes mellitus   .  Esophageal reflux   . Fibromyalgia   . Gastric ulcer   . Gastroparesis   . Gout   . Hyperlipidemia   . Hypertension   . Lumbosacral stenosis   . NICM (nonischemic cardiomyopathy) ([place])   . Obesity   . PAF (paroxysmal atrial fibrillation) ([place])   . Stroke ([place]) 02/2011  . Vitamin B12 deficiency anemia     Patient Active Problem List   Diagnosis Date Noted  . Vaginal yeast infection 01/04/2020  . AKI (acute kidney injury) ([place])  11/27/2019  . Chronic pain 09/04/2019  . Malignant carcinoid tumor of duodenum ([place])   . Nonalcoholic fatty liver disease 06/05/2019  . Chronic diarrhea   . COPD exacerbation ([place]) 05/08/2019  . Acute on chronic HFrEF (heart failure with reduced ejection fraction) ([place])   . Restrictive lung disease secondary to obesity   . History of gastric ulcer   . Uncontrolled type 2 diabetes mellitus with hyperglycemia ([place])   . Fibroid uterus 02/23/2019  . Chronic systolic CHF (congestive heart failure) ([place]) 12/19/2018  . Intractable nausea and vomiting 12/17/2018  . Hypoxia 12/17/2018  . Abdominal pain 07/17/2018  . Cardiac arrest ([place]) 11/29/2017  . Leukocytosis 11/29/2017  . Anxiety 11/29/2017  . Stroke (cerebrum) ([place]) 03/19/2017  . GERD (gastroesophageal reflux disease) 03/19/2017  . Depression 03/19/2017  . Morbid obesity ([place])   . Normocytic normochromic anemia 08/16/2016  . Diabetic gastroparesis ([place]) 06/05/2016  . Gout 06/05/2016  . Nonspecific chest pain 09/26/2015  . Hypokalemia 09/26/2015  . Hypomagnesemia 09/26/2015  . Nausea and vomiting 08/20/2015  . DKA (diabetic ketoacidoses) 05/25/2015  . Uncontrolled type 2 diabetes mellitus with diabetic neuropathy, with long-term current use of insulin ([place]) 05/25/2015  . Type 2 diabetes mellitus with hyperglycemia, with long-term current use of insulin ([place]) 05/25/2015  . CKD (chronic kidney disease), stage II 05/25/2015  . Essential hypertension, benign 09/28/2013    Past Surgical History:  Procedure Laterality Date  . BIOPSY  07/27/2019   Procedure: BIOPSY;  Surgeon: Clarene Essex, MD;  Location: [facility] ENDOSCOPY;  Service: Endoscopy;;  . BIOPSY  07/30/2019   Procedure: BIOPSY;  Surgeon: Otis Brace, MD;  Location: [facility]OSCOPY;  Service: Gastroenterology;;  . CATARACT EXTRACTION  01/2014  . CHOLECYSTECTOMY    . COLONOSCOPY WITH PROPOFOL N/A 07/30/2019   Procedure: COLONOSCOPY WITH PROPOFOL;  Surgeon: Otis Brace, MD;   Location: [facility];  Service: Gastroenterology;  Laterality: N/A;  . ESOPHAGOGASTRODUODENOSCOPY N/A 07/27/2019   Procedure: ESOPHAGOGASTRODUODENOSCOPY (EGD);  Surgeon: Clarene Essex, MD;  Location: [facility];  Service: Endoscopy;  Laterality: N/A;  . ESOPHAGOGASTRODUODENOSCOPY (EGD) WITH PROPOFOL N/A 08/02/2019   Procedure: ESOPHAGOGASTRODUODENOSCOPY (EGD) WITH PROPOFOL;  Surgeon: Otis Brace, MD;  Location: [facility];  Service: Gastroenterology;  Laterality: N/A;  . HEMOSTASIS CLIP PLACEMENT  08/02/2019   Procedure: HEMOSTASIS CLIP PLACEMENT;  Surgeon: Otis Brace, MD;  Location: [facility];  Service: Gastroenterology;;  . POLYPECTOMY  07/30/2019   Procedure: POLYPECTOMY;  Surgeon: Otis Brace, MD;  Location: [facility];  Service: Gastroenterology;;  . POLYPECTOMY  08/02/2019   Procedure: POLYPECTOMY;  Surgeon: Otis Brace, MD;  Location: [facility];  Service: Gastroenterology;;     OB History    Gravida  0   Para  0   Term  0   Preterm  0   AB  0   Living  0     SAB  0   IAB  0   Ectopic  0   Multiple  0   Live Births  0           Family History  Problem Relation Age of Onset  . Diabetes Mother   . Diabetes Father   . Heart disease Father   . Diabetes Sister   . Congestive Heart Failure Sister 68  . Diabetes Brother     Social History   Tobacco Use  . Smoking status: Never Smoker  . Smokeless tobacco: Never Used  Vaping Use  . Vaping Use: Never used  Substance Use Topics  . Alcohol use: No  . Drug use: No    Home Medications Prior to Admission medications   Medication Sig Start Date End Date Taking? Authorizing Provider  potassium chloride (KLOR-CON) 10 MEQ tablet Take 1 tablet (10 mEq total) by mouth daily for 5 days. 05/25/20 05/30/20 Yes Venter, Margaux, PA-C  albuterol (PROVENTIL) (2.5 MG/3ML) 0.083% nebulizer solution Take 3 mLs (2.5 mg total) by nebulization every 6 (six) hours as needed for wheezing or  shortness of breath. 04/06/19   Scot Jun, FNP  allopurinol (ZYLOPRIM) 100 MG tablet Take 100 mg by mouth daily.    [provider]  ALPRAZolam Duanne Moron) 1 MG tablet Take 1 tablet (1 mg total) by mouth 2 (two) times daily as needed for anxiety. 09/03/19   Lockamy, Christia Reading, DO  atorvastatin (LIPITOR) 10 MG tablet TAKE ONE TABLET BY MOUTH ONCE DAILY (AM) Patient taking differently: Take 10 mg by mouth daily. 11/14/19   Meccariello, Bernita Raisin, DO  blood glucose meter kit and supplies KIT Dispense based on patient and insurance preference. Use up to four times daily as directed. (FOR ICD-9 250.00, 250.01). Patient taking differently: Inject 1 each into the skin See admin instructions. Dispense based on patient and insurance preference. Use up to four times daily as directed. (FOR ICD-9 250.00, 250.01). 12/13/18   Lockamy, Christia Reading, DO  DULoxetine HCl 40 MG CPEP TAKE ONE CAPSULE BY MOUTH TWICE DAILY Patient taking differently: Take 40 mg by mouth in the morning and at bedtime. 05/06/20   Meccariello, Bernita Raisin, DO  EASY COMFORT PEN NEEDLES 31G X 5 MM MISC USE 3 TIMES A DAY FOR INSULIN ADMINISTRATION Patient taking differently: 1 each by Other route 3 (three) times daily. 11/14/19   Meccariello, Mel Almond J, DO  ELIQUIS 5 MG TABS tablet TAKE 1 TABLET BY MOUTH 2 (TWO) TIMES DAILY. Patient taking differently: Take 5 mg by mouth 2 (two) times daily. 12/20/19   Meccariello, Bernita Raisin, DO  FARXIGA 10 MG TABS tablet Take 10 mg by mouth daily. 10/02/19   [provider]  ferrous sulfate 325 (65 FE) MG tablet Take 1 tablet (325 mg total) by mouth daily with breakfast. Patient not taking: Reported on 03/28/2020 09/01/19 01/23/20  Alma Friendly, MD  fluconazole (DIFLUCAN) 50 MG tablet Take 1 tablet (50 mg total) by mouth daily. 05/05/20   Raulkar, Clide Deutscher, MD  fluticasone (FLONASE) 50 MCG/ACT nasal spray Place 2 sprays into both nostrils daily as needed for allergies or rhinitis. 12/19/18   Rai,  Vernelle Emerald, MD  hydrALAZINE (APRESOLINE) 25 MG tablet Take 1 tablet (25 mg total) by mouth 3 (three) times daily. 05/07/20   Meccariello, Bernita Raisin, DO  HYDROmorphone (DILAUDID) 2 MG tablet Take 1 tablet (2 mg total) by mouth daily as needed for severe pain. 05/05/20   Raulkar, Clide Deutscher, MD  hydrOXYzine (ATARAX/VISTARIL) 10 MG tablet Take 1 tablet (10 mg total) by mouth 3 (three) times daily as needed. 11/15/19   Meccariello, Bernita Raisin,  DO  isosorbide mononitrate (IMDUR) 60 MG 24 hr tablet TAKE 1 TABLET (60 MG TOTAL) BY MOUTH DAILY. 02/04/20   Meccariello, Bernita Raisin, DO  LANTUS SOLOSTAR 100 UNIT/ML Solostar Pen Inject 32 Units into the skin at bedtime. Patient taking differently: Inject 50 Units into the skin at bedtime. 01/28/20 02/29/20  Raiford Noble Latif, DO  melatonin 3 MG TABS tablet Take 2 tablets (6 mg total) by mouth at bedtime. 02/04/20   Meccariello, Bernita Raisin, DO  metoCLOPramide (REGLAN) 10 MG tablet Take 10 mg by mouth 4 (four) times daily.    [provider]  metoprolol succinate (TOPROL-XL) 25 MG 24 hr tablet Take 1 tablet (25 mg total) by mouth daily. 02/04/20   Meccariello, Bernita Raisin, DO  nitroGLYCERIN (NITROSTAT) 0.4 MG SL tablet Place 1 tablet (0.4 mg total) under the tongue every 5 (five) minutes as needed for chest pain. Patient taking differently: Place 0.4 mg under the tongue every 5 (five) minutes as needed for chest pain (max 3 doses). 12/13/18   Lockamy, Timothy, DO  NOVOLOG FLEXPEN 100 UNIT/ML FlexPen Inject 30 Units into the skin in the morning, at noon, in the evening, and at bedtime. 02/04/20   Meccariello, Bernita Raisin, DO  ondansetron (ZOFRAN) 4 MG tablet Take 1 tablet (4 mg total) by mouth every 6 (six) hours as needed for nausea. 05/05/20   Raulkar, Clide Deutscher, MD  pantoprazole (PROTONIX) 40 MG tablet TAKE ONE TABLET BY MOUTH TWICE DAILY Patient taking differently: Take 40 mg by mouth 2 (two) times daily. 09/04/19   [name], DO  torsemide (DEMADEX) 20 MG tablet  TAKE 2 TABLETS (40 MG TOTAL) BY MOUTH DAILY. 02/04/20   Meccariello, Bernita Raisin, DO    Allergies    Diazepam, Isovue [iopamidol], Lisinopril, Metoclopramide, Penicillins, Tolmetin, Aspirin, Nsaids, Acetaminophen, and Tramadol  Review of Systems   Review of Systems  Constitutional: Negative for chills and fever.  Respiratory: Positive for shortness of breath. Negative for cough.   Cardiovascular: Positive for chest pain.  Gastrointestinal: Positive for abdominal pain, diarrhea, nausea and vomiting.  All other systems reviewed and are negative.   Physical Exam Updated Vital Signs BP (!) 145/125   Pulse (!) 116   Temp 97.9 F (36.6 C) (Oral)   Resp (!) 23   Ht '5\' 6"'  (1.676 m)   Wt (!) 143.8 kg   LMP 10/10/2012   SpO2 100%   BMI 51.17 kg/m   Physical Exam Vitals and nursing note reviewed.  Constitutional:      Appearance: She is obese. She is not ill-appearing.  HENT:     Head: Normocephalic and atraumatic.  Eyes:     Conjunctiva/sclera: Conjunctivae normal.  Cardiovascular:     Rate and Rhythm: Normal rate and regular rhythm.     Pulses:          Radial pulses are 2+ on the right side and 2+ on the left side.       Dorsalis pedis pulses are 2+ on the right side and 2+ on the left side.     Heart sounds: Normal heart sounds.  Pulmonary:     Effort: Pulmonary effort is normal.     Breath sounds: Normal breath sounds. No decreased breath sounds, wheezing, rhonchi or rales.  Chest:     Chest wall: No tenderness.  Abdominal:     Palpations: Abdomen is soft.     Tenderness: There is abdominal tenderness. There is no guarding or rebound.     Comments:  Soft, diffuse abdominal TTP, +BS throughout, no r/g/r, neg murphy's, neg mcburney's, no CVA TTP  Musculoskeletal:     Cervical back: Neck supple.  Skin:    General: Skin is warm and dry.  Neurological:     Mental Status: She is alert.     ED Results / Procedures / Treatments   Labs (all labs ordered are listed, but  only abnormal results are displayed) Labs Reviewed  COMPREHENSIVE METABOLIC PANEL - Abnormal; Notable for the following components:      Result Value   Potassium 2.8 (*)    CO2 18 (*)    Glucose, Bld 199 (*)    BUN 24 (*)    Creatinine, Ser 2.10 (*)    Total Protein 8.8 (*)    GFR, Estimated 27 (*)    Anion gap 16 (*)    All other components within normal limits  CBC WITH DIFFERENTIAL/PLATELET - Abnormal; Notable for the following components:   WBC 13.2 (*)    RBC 3.51 (*)    Hemoglobin 9.8 (*)    HCT 31.8 (*)    Platelets 550 (*)    Neutro Abs 8.7 (*)    Abs Immature Granulocytes 0.10 (*)    All other components within normal limits  CBG MONITORING, ED - Abnormal; Notable for the following components:   Glucose-Capillary 203 (*)    All other components within normal limits  TROPONIN I (HIGH SENSITIVITY) - Abnormal; Notable for the following components:   Troponin I (High Sensitivity) 32 (*)    All other components within normal limits  LIPASE, BLOOD  BRAIN NATRIURETIC PEPTIDE  URINALYSIS, ROUTINE W REFLEX MICROSCOPIC    EKG EKG Interpretation  Date/Time:  'Sunday May 25 2020 10:55:43 EDT Ventricular Rate:  120 PR Interval:    QRS Duration: 83 QT Interval:  340 QTC Calculation: 481 R Axis:   -36 Text Interpretation: Sinus tachycardia with irregular rate Left axis deviation tachycardia new from previous Confirmed by Little, Rachel ([id number]) on 05/25/2020 1:13:31 PM   Radiology DG Chest Port 1 View  Result Date: 05/25/2020 CLINICAL DATA:  Chest pain starting 2 days ago. EXAM: PORTABLE CHEST 1 VIEW COMPARISON:  January 28, 2020 FINDINGS: The heart size and mediastinal contours are within normal limits. Both lungs are clear. The visualized skeletal structures are unremarkable. IMPRESSION: No active disease. Electronically Signed   By: Wei-Chen  Lin M.D.   On: 05/25/2020 12:00    Procedures Procedures   Medications Ordered in ED Medications  aspirin chewable tablet  324 mg (324 mg Oral Patient Refused/Not Given 05/25/20 1111)  sodium chloride 0.9 % bolus 1,000 mL (1,000 mLs Intravenous New Bag/Given 05/25/20 1126)  HYDROmorphone (DILAUDID) injection 0.5 mg (0.5 mg Intravenous Given 05/25/20 1116)  ondansetron (ZOFRAN) injection 4 mg (4 mg Intravenous Given 05/25/20 1116)  potassium chloride 10 mEq in 100 mL IVPB (0 mEq Intravenous Stopped 05/25/20 1357)  HYDROmorphone (DILAUDID) injection 1 mg (1 mg Intravenous Given 05/25/20 1302)    ED Course  I have reviewed the triage vital signs and the nursing notes.  Pertinent labs & imaging results that were available during my care of the patient were reviewed by me and considered in my medical decision making (see chart for details).    MDM Rules/Calculators/A&P                          35'  year old female who presents to the ED today after PCP advised that her creatinine  was elevated at 3.6 and she should come back to the ED.  IT appears that her creatinine was actually 2.43 at PCP's office on 3/17 (downtrending from [place] where it was as high as 3.5). Does appear she was recently admitted to [facility] from 3/12-3/15 after presenting for chest pain abdominal pain and found to have an elevated troponin at 90 initially however downtrending to 74. Admitted for AKI and elevated troponins.  She had a negative CT of her abdomen and pelvis at that time for acute findings besides a left adnexal cyst however no other findings.  Unfortunately it appears that patient left AGAINST MEDICAL ADVICE.  It does appear that cardiology evaluated patient and thought that her elevated troponins were likely in the setting of CKD as well as dehydration there was concern for underlying CAD in the setting of chest pain which was responsive to nitro.  Patient did have a nuclear medicine stress test done however it appears that she developed nausea and chest pain and was unable to continue. She refused medical treatment afterwards and signed out.  Pt continuing to have chest pain and abdominal pain. On arrival to the ED pt is afebrile. Tachycardic in the 110s suspected from dehydration. Satting 100% on RA and LCTAB. Will plan to work up for chest pain, abdominal pain, and elevated creatinine. Pt does have diffuse abdominal TTP on exam without rebound or guarding. Low suspicion for acute abdomen at this time. She did have a CT scan done while at [facility] without acute findings besides left adnexal mass; pelvis ultrasound thought to exophytic fibroid. Doubt acute abdomen at this time. I suspect her abdominal pain is from her gastroparesis which was the thought at [facility] as well. ASA provided however pt refused as it upsets her stomach. She is requesting pain medication. It does appear she continued to request IV dilaudid at a frequency greater than she is prescribed at home during her [facility]. PDMP reveiewed - she receives #30 dilaudid monthly. If labwork unremarkable today compared to previous [place] admission do not feel pt requires admission today given she came due to her PCP stating her kidney function was elevated and her pain appears chronic.  CBC with leukocytosis 13.2 however < from 15 at [facility]. She is afebrile here. Doubt related to infection today. Hgb stable at 9.8 CMP with creatinine 2.10 which is near her baseline. (improved from PCP at 2.43 last week). BUN slightly elevated at 24. IT does appear her creatinine went up to 3.5 during her admission at [facility]. Bicarb is low at 18, glucose 199, gap slightly elevated at 16 however not consistent with DKA. Pt receiving fluids for dehydration. Potassium is 2.8 today; will provide IV potassium.   Lab Results  Component Value Date   CREATININE 2.10 (H) 05/25/2020   CREATININE 1.58 (H) 03/30/2020   CREATININE 1.64 (H) 03/28/2020   Lipase 24 Troponin of 32. Downtrending from visit at [facility]. Given chest pain has been present for 1 week I do not feel she requires additional repeat troponin CXR  clear BNP 51.2  Pt was initially provided with 0.5 mg Dilaudid. She requested more pain medication consistent with her admission to [facility]. An additional 1 mg Dilaudid was given however pt was instructed that no more narcotics would be provided as I suspect her pain is related to gastroparesis. Workup is overall reassuring and kidney function is not at 3.6 as originally thought and downtrending from PCP's office last week. Will fluid challenge at this time and  if pt able to tolerate PO will discharge home. I have walked by the room several times with pt sleeping comfortably in NAD.   Pt able to tolerate fluids. Advised that we will discharge home at this time with GI and PCP follow up. Will discharge with potassium supplements. She has zofran at home as well as oral dilaudid. When pt found out she was going to be discharged she requested more IV dilaudid and when I declined she reported she was going to go to [facility]. Concern for drug seeking behavior. Stable for discharge.   After discharged pt requested to speak with attending physician Dr. Rex Kras regarding need for admission for her chronic pain that she has been dealing with for 3 years. A lengthy discussion was had with patient given that her workup is overall reassuring, she has been IV hydrated here, and potassium provided and unfortunately given that her gastroparesis is chronic that it is unlikely that pain will completely go away. Pt unfortunately began raising her voice at myself and attending physician and was ultimately unhappy with her care.   This note was prepared using Dragon voice recognition software and may include unintentional dictation errors due to the inherent limitations of voice recognition software.   Final Clinical Impression(s) / ED Diagnoses Final diagnoses:  Gastroparesis  Nonspecific chest pain  Dehydration  Hypokalemia    Rx / DC Orders ED Discharge Orders         Ordered    potassium chloride (KLOR-CON) 10  MEQ tablet  Daily        05/25/20 1438           Discharge Instructions     Your kidney function was 2.1 today which is very near your baseline. Continue drinking plenty of water to stay hydrated. Have your kidney function rechecked in 1 week by your PCP.   Continue taking your home oral dilaudid as needed for your pain. Use the zofran as needed.   Your potassium was slightly low today. Please pick up oral potassium supplements and take as prescribed. Your PCP will need to recheck your potassium levels as well.         Eustaquio Maize, PA-C 05/25/20 1527    Little, Wenda Overland, MD 05/28/20 1425

## 2020-06-02 ENCOUNTER — Encounter: Payer: Medicare Other | Admitting: Registered Nurse

## 2020-06-03 ENCOUNTER — Encounter: Payer: Medicare Other | Attending: Physical Medicine and Rehabilitation | Admitting: Psychology

## 2020-06-03 DIAGNOSIS — Z5181 Encounter for therapeutic drug level monitoring: Secondary | ICD-10-CM | POA: Insufficient documentation

## 2020-06-03 DIAGNOSIS — C7A010 Malignant carcinoid tumor of the duodenum: Secondary | ICD-10-CM | POA: Insufficient documentation

## 2020-06-03 DIAGNOSIS — Z79899 Other long term (current) drug therapy: Secondary | ICD-10-CM | POA: Insufficient documentation

## 2020-06-03 DIAGNOSIS — G893 Neoplasm related pain (acute) (chronic): Secondary | ICD-10-CM | POA: Insufficient documentation

## 2020-06-03 DIAGNOSIS — G894 Chronic pain syndrome: Secondary | ICD-10-CM | POA: Insufficient documentation

## 2020-06-05 ENCOUNTER — Encounter: Payer: Self-pay | Admitting: Registered Nurse

## 2020-06-05 ENCOUNTER — Other Ambulatory Visit: Payer: Self-pay

## 2020-06-05 ENCOUNTER — Encounter (HOSPITAL_BASED_OUTPATIENT_CLINIC_OR_DEPARTMENT_OTHER): Payer: Medicare Other | Admitting: Registered Nurse

## 2020-06-05 VITALS — BP 124/76 | HR 94 | Temp 97.6°F | Ht 66.0 in | Wt 298.0 lb

## 2020-06-05 DIAGNOSIS — Z79899 Other long term (current) drug therapy: Secondary | ICD-10-CM

## 2020-06-05 DIAGNOSIS — G894 Chronic pain syndrome: Secondary | ICD-10-CM

## 2020-06-05 DIAGNOSIS — C7A010 Malignant carcinoid tumor of the duodenum: Secondary | ICD-10-CM

## 2020-06-05 DIAGNOSIS — G893 Neoplasm related pain (acute) (chronic): Secondary | ICD-10-CM | POA: Diagnosis present

## 2020-06-05 DIAGNOSIS — Z5181 Encounter for therapeutic drug level monitoring: Secondary | ICD-10-CM | POA: Diagnosis not present

## 2020-06-05 MED ORDER — HYDROMORPHONE HCL 2 MG PO TABS
2.0000 mg | ORAL_TABLET | Freq: Every day | ORAL | 0 refills | Status: DC | PRN
Start: 2020-06-05 — End: 2020-07-01

## 2020-06-05 NOTE — Progress Notes (Signed)
Subjective:    Patient ID: Deborah [name], female    DOB: 01-09-1966, 55 y.o.   MRN: [id number]  HPI: Deborah [name] is a 55 y.o. female who returns for follow up appointment for chronic pain and medication refill. She states her pain is located in her abdomen. She rates her pain 10. Her  current exercise regime is walking short distances in her home.   Deborah [name] was admitted to [facility] on 05/29/2020 and discharged home on 06/02/2020. She was admitted for abdominal pain, discharge summary was reviewed.   Deborah [name] Dilaudid was filled on 05/09/2020, she doesn't have any medication today, she reports. She states she was experiencing increase intensity of abdominal pain prior to hospitalization and admits to taking more medication than prescribed. We reviewed the narcotic policy,she verbalizes understanding. This can lead her to be discharge from our office. She verbalizes understanding. She was instructed to call office or send a My-Chart if she develops increase intensity of abdominal pain and to only take her medication as prescribed. She verbalizes understanding.    Pain Inventory Average Pain 10 Pain Right Now 10 My pain is sharp  In the last 24 hours, has pain interfered with the following? General activity 10 Relation with others 10 Enjoyment of life 10 What TIME of day is your pain at its worst? morning , daytime, evening and night Sleep (in general) Poor  Pain is worse with: walking, bending, sitting, inactivity, standing, unsure and some activites Pain improves with: NOT ANSWERED Relief from Meds: 5  Family History  Problem Relation Age of Onset  . Diabetes Mother   . Diabetes Father   . Heart disease Father   . Diabetes Sister   . Congestive Heart Failure Sister 10  . Diabetes Brother    Social History   Socioeconomic History  . Marital status: Single    Spouse name: Not on file  . Number of children: Not on file  . Years of  education: Not on file  . Highest education level: Not on file  Occupational History  . Not on file  Tobacco Use  . Smoking status: Never Smoker  . Smokeless tobacco: Never Used  Vaping Use  . Vaping Use: Never used  Substance and Sexual Activity  . Alcohol use: No  . Drug use: No  . Sexual activity: Not Currently    Birth control/protection: None  Other Topics Concern  . Not on file  Social History Narrative   ** Merged History Encounter **       Social Determinants of Health   Financial Resource Strain: Not on file  Food Insecurity: Not on file  Transportation Needs: Not on file  Physical Activity: Not on file  Stress: Not on file  Social Connections: Not on file   Past Surgical History:  Procedure Laterality Date  . BIOPSY  07/27/2019   Procedure: BIOPSY;  Surgeon: Clarene Essex, MD;  [facility]: [facility];  Service: Endoscopy;;  . BIOPSY  07/30/2019   Procedure: BIOPSY;  Surgeon: Otis Brace, MD;  [facility];  Service: Gastroenterology;;  . CATARACT EXTRACTION  01/2014  . CHOLECYSTECTOMY    . COLONOSCOPY WITH PROPOFOL N/A 07/30/2019   Procedure: COLONOSCOPY WITH PROPOFOL;  Surgeon: Otis Brace, MD;  [facility];  Service: Gastroenterology;  Laterality: N/A;  . ESOPHAGOGASTRODUODENOSCOPY N/A 07/27/2019   Procedure: ESOPHAGOGASTRODUODENOSCOPY (EGD);  Surgeon: Clarene Essex, MD;  Location[facility];  Service: Endoscopy;  Laterality: N/A;  . ESOPHAGOGASTRODUODENOSCOPY (EGD)  WITH PROPOFOL N/A 08/02/2019   Procedure: ESOPHAGOGASTRODUODENOSCOPY (EGD) WITH PROPOFOL;  Surgeon: Otis Brace, MD;  [facility];  Service: Gastroenterology;  Laterality: N/A;  . HEMOSTASIS CLIP PLACEMENT  08/02/2019   Procedure: HEMOSTASIS CLIP PLACEMENT;  Surgeon: Otis Brace, MD;  [facility];  Service: Gastroenterology;;  . POLYPECTOMY  07/30/2019   Procedure: POLYPECTOMY;  Surgeon: Otis Brace, MD;  [facility];   Service: Gastroenterology;;  . POLYPECTOMY  08/02/2019   Procedure: POLYPECTOMY;  Surgeon: Otis Brace, MD;  [facility];  Service: Gastroenterology;;   Past Surgical History:  Procedure Laterality Date  . BIOPSY  07/27/2019   Procedure: BIOPSY;  Surgeon: Clarene Essex, MD;  [facility];  Service: Endoscopy;;  . BIOPSY  07/30/2019   Procedure: BIOPSY;  Surgeon: Otis Brace, MD;  [facility];  Service: Gastroenterology;;  . CATARACT EXTRACTION  01/2014  . CHOLECYSTECTOMY    . COLONOSCOPY WITH PROPOFOL N/A 07/30/2019   Procedure: COLONOSCOPY WITH PROPOFOL;  Surgeon: Otis Brace, MD;  [facility];  Service: Gastroenterology;  Laterality: N/A;  . ESOPHAGOGASTRODUODENOSCOPY N/A 07/27/2019   Procedure: ESOPHAGOGASTRODUODENOSCOPY (EGD);  Surgeon: Clarene Essex, MD;  Location: [facility];  Service: Endoscopy;  Laterality: N/A;  . ESOPHAGOGASTRODUODENOSCOPY (EGD) WITH PROPOFOL N/A 08/02/2019   Procedure: ESOPHAGOGASTRODUODENOSCOPY (EGD) WITH PROPOFOL;  Surgeon: Otis Brace, MD;  [facility];  Service: Gastroenterology;  Laterality: N/A;  . HEMOSTASIS CLIP PLACEMENT  08/02/2019   Procedure: HEMOSTASIS CLIP PLACEMENT;  Surgeon: Otis Brace, MD;  [facility];  Service: Gastroenterology;;  . POLYPECTOMY  07/30/2019   Procedure: POLYPECTOMY;  Surgeon: Otis Brace, MD;  [facility];  Service: Gastroenterology;;  . POLYPECTOMY  08/02/2019   Procedure: POLYPECTOMY;  Surgeon: Otis Brace, MD;  [facility];  Service: Gastroenterology;;   Past Medical History:  Diagnosis Date  . Acute back pain with sciatica, left   . Acute back pain with sciatica, right   . AKI (acute kidney injury) ([place])   . Anemia, unspecified   . Cancer ([place])   . Carcinoid tumor of duodenum   . Chronic pain   . Chronic systolic CHF (congestive heart failure) ([place])   . Diabetes mellitus   . Esophageal reflux   .  Fibromyalgia   . Gastric ulcer   . Gastroparesis   . Gout   . Hyperlipidemia   . Hypertension   . Lumbosacral stenosis   . NICM (nonischemic cardiomyopathy) ([place])   . Obesity   . PAF (paroxysmal atrial fibrillation) ([place])   . Stroke ([place]) 02/2011  . Vitamin B12 deficiency anemia    BP 124/76   Pulse 94   Temp 97.6 F (36.4 C)   Ht 5\' 6"  (1.676 m)   Wt 298 lb (135.2 kg)   LMP 10/10/2012   SpO2 99%   BMI 48.10 kg/m   Opioid Risk Score:   Fall Risk Score:  `1  Depression screen PHQ 2/9  Depression screen [facility] 2/9 06/05/2020 01/04/2020 11/15/2019 11/14/2019 10/10/2019 09/20/2019 09/03/2019  Decreased Interest 1 2 3 1 3 3  0  Down, Depressed, Hopeless 1 3 3 1 3 3 3   PHQ - 2 Score 2 5 6 2 6 6 3   Altered sleeping - 3 3 - 3 3 -  Tired, decreased energy - 3 3 - 3 3 -  Change in appetite - 3 3 - 3 3 -  Feeling bad or failure about yourself  - 3 3 - 2 2 -  Trouble concentrating - 3  3 - 3 3 -  Moving slowly or fidgety/restless - 3 3 - 3 1 -  Suicidal thoughts - 0 3 - 0 1 -  PHQ-9 Score - 23 27 - 23 - -  Difficult doing work/chores - - - - - Very difficult -  Some recent data might be hidden      Review of Systems  Constitutional: Negative.   HENT: Negative.   Eyes: Negative.   Respiratory: Negative.   Cardiovascular: Negative.   Gastrointestinal: Negative.   Endocrine: Negative.   Genitourinary: Negative.   Musculoskeletal: Positive for gait problem.  Skin: Negative.   Allergic/Immunologic: Negative.   Hematological: Negative.   Psychiatric/Behavioral: Negative.        Objective:   Physical Exam Vitals and nursing note reviewed.  Constitutional:      Appearance: Normal appearance.  Cardiovascular:     Rate and Rhythm: Normal rate and regular rhythm.     Pulses: Normal pulses.     Heart sounds: Normal heart sounds.  Pulmonary:     Effort: Pulmonary effort is normal.     Breath sounds: Normal breath sounds.  Musculoskeletal:     Cervical back: Normal range of  motion and neck supple.     Comments: Normal Muscle Bulk and Muscle Testing Reveals:  Upper Extremities: Full ROM and Muscle Strength 3/5 Thoracic Paraspinal Tenderness: T-7-T-9  Lumbar Paraspinal Tenderness: L-4-L-5 Lower Extremities: Full ROM and Muscle Strength 5/5 Bilateral Lower Extremity Flexion Produces Pain into her Bilateral Lower Extremities.  Arrived in wheelchair   Skin:    General: Skin is warm and dry.  Neurological:     Mental Status: She is alert and oriented to person, place, and time.  Psychiatric:        Mood and Affect: Mood normal.        Behavior: Behavior normal.           Assessment & Plan:  1. 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  2. Chronic Pain Syndrome: Refilled Dilaudid 2 mg one tablet daily as needed for sever pain. #30.  We will continue the opioid monitoring program, this consists of regular clinic visits, examinations, urine drug screen, pill counts as well as use of [place] Controlled Substance Reporting system. A 12 month History has been reviewed on the [place] Controlled Substance Reporting System on 06/05/2020.  F/U with Dr Ranell Patrick in 1 month

## 2020-06-08 LAB — DRUG TOX MONITOR 1 W/CONF, ORAL FLD
Amphetamines: NEGATIVE ng/mL (ref <10)
Barbiturates: NEGATIVE ng/mL (ref <10)
Benzodiazepines: NEGATIVE ng/mL (ref <0.50)
Buprenorphine: NEGATIVE ng/mL (ref <0.10)
Cocaine: NEGATIVE ng/mL (ref <5.0)
Fentanyl: NEGATIVE ng/mL (ref <0.10)
Heroin Metabolite: NEGATIVE ng/mL (ref <1.0)
MARIJUANA: NEGATIVE ng/mL (ref <2.5)
MDMA: NEGATIVE ng/mL (ref <10)
Meprobamate: NEGATIVE ng/mL (ref <2.5)
Methadone: NEGATIVE ng/mL (ref <5.0)
Nicotine Metabolite: NEGATIVE ng/mL (ref <5.0)
Opiates: NEGATIVE ng/mL (ref <2.5)
Phencyclidine: NEGATIVE ng/mL (ref <10)
Tapentadol: NEGATIVE ng/mL (ref <5.0)
Tramadol: NEGATIVE ng/mL (ref <5.0)
Zolpidem: NEGATIVE ng/mL (ref <5.0)

## 2020-06-08 LAB — DRUG TOX ALC METAB W/CON, ORAL FLD: Alcohol Metabolite: NEGATIVE ng/mL (ref <25)

## 2020-06-13 ENCOUNTER — Telehealth: Payer: Self-pay | Admitting: *Deleted

## 2020-06-13 NOTE — Telephone Encounter (Signed)
Oral swab drug screen was NEGATIVE for medication. This is the second swab for Ms Deborah [name] and both have been negative for her hydromorphone. Both times she reported she had taken the medication the day before the test. I noted the last test as consistent but this may not have been so, she had taken the medication the day before the test. Both drug references report hydromorphone is present in the system for up to 2-3 days post ingestion and oral fluid 36 hours. I suggest she no longer receive oral swab tests and retest with urine drug screen.

## 2020-06-18 NOTE — Telephone Encounter (Signed)
I have scheduled a urine test next visit 07/01/20. Last filled hydromorphone 3/31, so she should have medication at that visit.

## 2020-06-18 NOTE — Telephone Encounter (Signed)
Will Forward to Dr Ranell Patrick.

## 2020-06-18 NOTE — Telephone Encounter (Signed)
Agree with you, thank you!

## 2020-07-01 ENCOUNTER — Encounter
Payer: Medicare Other | Attending: Physical Medicine and Rehabilitation | Admitting: Physical Medicine and Rehabilitation

## 2020-07-01 ENCOUNTER — Encounter: Payer: Self-pay | Admitting: Physical Medicine and Rehabilitation

## 2020-07-01 ENCOUNTER — Other Ambulatory Visit: Payer: Self-pay | Admitting: Family Medicine

## 2020-07-01 ENCOUNTER — Other Ambulatory Visit: Payer: Self-pay

## 2020-07-01 VITALS — BP 83/55 | HR 73 | Temp 98.8°F

## 2020-07-01 DIAGNOSIS — G894 Chronic pain syndrome: Secondary | ICD-10-CM | POA: Diagnosis not present

## 2020-07-01 DIAGNOSIS — Z5181 Encounter for therapeutic drug level monitoring: Secondary | ICD-10-CM | POA: Diagnosis not present

## 2020-07-01 DIAGNOSIS — Z79891 Long term (current) use of opiate analgesic: Secondary | ICD-10-CM | POA: Insufficient documentation

## 2020-07-01 MED ORDER — HYDROMORPHONE HCL 2 MG PO TABS: 2.0000 mg | ORAL_TABLET | Freq: Every day | ORAL | 0 refills | Status: DC | PRN

## 2020-07-01 MED ORDER — TORSEMIDE 20 MG PO TABS: 40.0000 mg | ORAL_TABLET | Freq: Every day | ORAL | 0 refills | Status: DC

## 2020-07-01 MED ORDER — ONDANSETRON HCL 4 MG PO TABS: 4.0000 mg | ORAL_TABLET | Freq: Four times a day (QID) | ORAL | 0 refills | Status: DC | PRN

## 2020-07-01 NOTE — Patient Instructions (Signed)
1) Ginger (especially studied for arthritis)- reduce leukotriene production to decrease inflammation 2) Blueberries- high in phytonutrients that decrease inflammation 3) Salmon- marine omega-3s reduce joint swelling and pain 4) Pumpkin seeds- reduce inflammation 5) dark chocolate- reduces inflammation 6) turmeric- reduces inflammation 7) tart cherries - reduce pain and stiffness 8) extra virgin olive oil - its compound olecanthal helps to block prostaglandins  9) chili peppers- can be eaten or applied topically via capsaicin 10) mint- helpful for headache, muscle aches, joint pain, and itching 11) garlic- reduces inflammation  Link to further information on diet for chronic pain: [email or web address]://[email or web address]/[email or web address]/[email or web address]-[email or web address]-pain  

## 2020-07-01 NOTE — Progress Notes (Signed)
Subjective:    Patient ID: Deborah [name], female    DOB: 04-Apr-1965, 55 y.o.   MRN: [id number]  HPI  Deborah [name] is a 55 year old woman who presents for follow-up of severe cancer-related abdominal pain.   1) Grief: -Her sister passed away recently. This was the last of her siblings to pass. She is tearful about this and would like to speak with a psychologist. She died of COPD and she feels that now she is in a better place.   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 Severe pain may be related to scar tissue or adhesions. -Has been in and out of the hospital because of her pain.  -She missed several appointments here due to hospitalization so was unable to receive medication from our clinic. It has been hard for her to get appointment here due to limited openings.  -Discussed following up with [name] NP one month and me the following month to help prevent this from happening again. -Pain is 8/10 on average and 10/10 right now and on average and feels constant and sharp.  -Pain affects her general activity, relation with others, and enjoyment of life severely. -spending a lot of time in bed.  -not able to walk much, is walking to the bathroom.  -she requires a refill of Dilaudid today.  -prefers no home therapy due to her extreme fatigue  3) Nausea: -she is only able to tolerate mashed potatoes and boiled eggs. -The Zofran tablet really helped- the sublingual film did not.  -she requires refill of Zofran  4) heart attack: -since last visit she suffered a mini heart attack. -has been feeling much better now.      Pain Inventory Average Pain 8 Pain Right Now 10 My pain is constant, sharp and stabbing  In the last 24 hours, has pain interfered with the following? General activity 10 Relation with others 10 Enjoyment of life 10 What TIME of day is your pain at its worst? morning ,  daytime, evening and night Sleep (in general) Poor  Pain is worse with: walking, bending, sitting, standing and some activites Pain improves with: medication Relief from Meds: 10  Family History  Problem Relation Age of Onset  . Diabetes Mother   . Diabetes Father   . Heart disease Father   . Diabetes Sister   . Congestive Heart Failure Sister 42  . Diabetes Brother    Social History   Socioeconomic History  . Marital status: Single    Spouse name: Not on file  . Number of children: Not on file  . Years of education: Not on file  . Highest education level: Not on file  Occupational History  . Not on file  Tobacco Use  . Smoking status: Never Smoker  . Smokeless tobacco: Never Used  Vaping Use  . Vaping Use: Never used  Substance and Sexual Activity  . Alcohol use: No  . Drug use: No  . Sexual activity: Not Currently    Birth control/protection: None  Other Topics Concern  . Not on file  Social History Narrative   ** Merged History Encounter **       Social Determinants of Health   Financial Resource Strain: Not on file  Food Insecurity: Not on file  Transportation Needs: Not on file  Physical Activity: Not on file  Stress: Not on file  Social Connections: Not on file  Past Surgical History:  Procedure Laterality Date  . BIOPSY  07/27/2019   Procedure: BIOPSY;  Surgeon: Clarene Essex, MD;  [facility];  Service: Endoscopy;;  . BIOPSY  07/30/2019   Procedure: BIOPSY;  Surgeon: Otis Brace, MD;  [facility];  Service: Gastroenterology;;  . CATARACT EXTRACTION  01/2014  . CHOLECYSTECTOMY    . COLONOSCOPY WITH PROPOFOL N/A 07/30/2019   Procedure: COLONOSCOPY WITH PROPOFOL;  Surgeon: Otis Brace, MD;  [facility];  Service: Gastroenterology;  Laterality: N/A;  . ESOPHAGOGASTRODUODENOSCOPY N/A 07/27/2019   Procedure: ESOPHAGOGASTRODUODENOSCOPY (EGD);  Surgeon: Clarene Essex, MD;  Location: [facility];  Service: Endoscopy;   Laterality: N/A;  . ESOPHAGOGASTRODUODENOSCOPY (EGD) WITH PROPOFOL N/A 08/02/2019   Procedure: ESOPHAGOGASTRODUODENOSCOPY (EGD) WITH PROPOFOL;  Surgeon: Otis Brace, MD;  [facility];  Service: Gastroenterology;  Laterality: N/A;  . HEMOSTASIS CLIP PLACEMENT  08/02/2019   Procedure: HEMOSTASIS CLIP PLACEMENT;  Surgeon: Otis Brace, MD;  [facility];  Service: Gastroenterology;;  . POLYPECTOMY  07/30/2019   Procedure: POLYPECTOMY;  Surgeon: Otis Brace, MD;  [facility];  Service: Gastroenterology;;  . POLYPECTOMY  08/02/2019   Procedure: POLYPECTOMY;  Surgeon: Otis Brace, MD;  [facility];  Service: Gastroenterology;;   Past Surgical History:  Procedure Laterality Date  . BIOPSY  07/27/2019   Procedure: BIOPSY;  Surgeon: Clarene Essex, MD;  [facility];  Service: Endoscopy;;  . BIOPSY  07/30/2019   Procedure: BIOPSY;  Surgeon: Otis Brace, MD;  [facility];  Service: Gastroenterology;;  . CATARACT EXTRACTION  01/2014  . CHOLECYSTECTOMY    . COLONOSCOPY WITH PROPOFOL N/A 07/30/2019   Procedure: COLONOSCOPY WITH PROPOFOL;  Surgeon: Otis Brace, MD;  [facility];  Service: Gastroenterology;  Laterality: N/A;  . ESOPHAGOGASTRODUODENOSCOPY N/A 07/27/2019   Procedure: ESOPHAGOGASTRODUODENOSCOPY (EGD);  Surgeon: Clarene Essex, MD;  [facility];  Service: Endoscopy;  Laterality: N/A;  . ESOPHAGOGASTRODUODENOSCOPY (EGD) WITH PROPOFOL N/A 08/02/2019   Procedure: ESOPHAGOGASTRODUODENOSCOPY (EGD) WITH PROPOFOL;  Surgeon: Otis Brace, MD;  [facility];  Service: Gastroenterology;  Laterality: N/A;  . HEMOSTASIS CLIP PLACEMENT  08/02/2019   Procedure: HEMOSTASIS CLIP PLACEMENT;  Surgeon: Otis Brace, MD;  [facility];  Service: Gastroenterology;;  . POLYPECTOMY  07/30/2019   Procedure: POLYPECTOMY;  Surgeon: Otis Brace, MD;  [facility];   Service: Gastroenterology;;  . POLYPECTOMY  08/02/2019   Procedure: POLYPECTOMY;  Surgeon: Otis Brace, MD;  [facility];  Service: Gastroenterology;;   Past Medical History:  Diagnosis Date  . Acute back pain with sciatica, left   . Acute back pain with sciatica, right   . AKI (acute kidney injury) ([place])   . Anemia, unspecified   . Cancer ([place])   . Carcinoid tumor of duodenum   . Chronic pain   . Chronic systolic CHF (congestive heart failure) ([place])   . Diabetes mellitus   . Esophageal reflux   . Fibromyalgia   . Gastric ulcer   . Gastroparesis   . Gout   . Hyperlipidemia   . Hypertension   . Lumbosacral stenosis   . NICM (nonischemic cardiomyopathy) ([place])   . Obesity   . PAF (paroxysmal atrial fibrillation) ([place])   . Stroke ([place]) 02/2011  . Vitamin B12 deficiency anemia    BP (!) 83/55   Pulse 73   Temp 98.8 F (37.1 C)   LMP 10/10/2012   SpO2 99%   Opioid Risk Score:   Fall Risk Score:  `1  Depression screen  PHQ 2/9  Depression screen [facility] 2/9 06/05/2020 01/04/2020 11/15/2019 11/14/2019 10/10/2019 09/20/2019 09/03/2019  Decreased Interest 1 2 3 1 3 3  0  Down, Depressed, Hopeless 1 3 3 1 3 3 3   PHQ - 2 Score 2 5 6 2 6 6 3   Altered sleeping - 3 3 - 3 3 -  Tired, decreased energy - 3 3 - 3 3 -  Change in appetite - 3 3 - 3 3 -  Feeling bad or failure about yourself  - 3 3 - 2 2 -  Trouble concentrating - 3 3 - 3 3 -  Moving slowly or fidgety/restless - 3 3 - 3 1 -  Suicidal thoughts - 0 3 - 0 1 -  PHQ-9 Score - 23 27 - 23 - -  Difficult doing work/chores - - - - - Very difficult -  Some recent data might be hidden    Review of Systems  Gastrointestinal: Positive for abdominal pain.  All other systems reviewed and are negative.      Objective:   Physical Exam Gen: no distress, normal appearing, obese HEENT: oral mucosa pink and moist, NCAT Cardio: Reg rate Chest: normal effort, normal rate of breathing Abd: soft, non-distended, tenderness to  palpation over bilateral lower quadrants Ext: no edema Psych: pleasant, normal affect, tearful when discussing her sister's passing Skin: intact Neuro: Alert and oriented x3. Musculoskeletal: Transported in wheelchair.      Assessment & Plan:  Mrs. Delagarza is a 55 year old woman who presents for f/u of the following medical conditions:  1)Chronic Pain Syndrome secondary to malignant carcinoid tumor of the duodenum.  -PDMP reviewed. Had one script for 5 tablets from another provider when we were unable to prescribe when she was unable to come in for appointments.  -Refilled Dilaudid 2mg  PRN daily for severe cancer-related pain  -Discussed current symptoms of pain and history of pain.  -Discussed benefits of exercise in reducing pain. -Advised to stop Powerade and Gatorade- she thought these are good for her, discussed it is best to avoid sugar and sugar drinks due to increased inflammation.  -Discussed benefits of exercise in reducing pain. -Discussed following foods that may reduce pain: 1) Ginger (especially studied for arthritis)- reduce leukotriene production to decrease inflammation 2) Blueberries- high in phytonutrients that decrease inflammation 3) Salmon- marine omega-3s reduce joint swelling and pain 4) Pumpkin seeds- reduce inflammation 5) dark chocolate- reduces inflammation 6) turmeric- reduces inflammation 7) tart cherries - reduce pain and stiffness 8) extra virgin olive oil - its compound olecanthal helps to block prostaglandins  9) chili peppers- can be eaten or applied topically via capsaicin 10) mint- helpful for headache, muscle aches, joint pain, and itching 11) garlic- reduces inflammation  Link to further information on diet for chronic pain: http://www.randall.com/   2) Emesis secondary to malignant carcinoid tumor of the duodenum.  -Zofran refilled -Recommended ginger with hot water, honey  and lemon -She is only able to tolerate limited foods: eggs, mashed potatoes. Advised that these are at least nutritious and to choose nutritious foods whenever possible.   3) Grief: -Provided emotional support and referred to neuropsych  .

## 2020-07-20 ENCOUNTER — Other Ambulatory Visit: Payer: Self-pay

## 2020-07-20 ENCOUNTER — Inpatient Hospital Stay (HOSPITAL_COMMUNITY)
Admission: EM | Admit: 2020-07-20 | Discharge: 2020-07-27 | DRG: 637 | Disposition: A | Discharge status: Home / Self Care | Payer: Medicare Other | Attending: Internal Medicine | Admitting: Internal Medicine

## 2020-07-20 ENCOUNTER — Encounter (HOSPITAL_COMMUNITY): Payer: Self-pay | Admitting: Emergency Medicine

## 2020-07-20 ENCOUNTER — Emergency Department (HOSPITAL_COMMUNITY): Payer: Medicare Other

## 2020-07-20 DIAGNOSIS — D62 Acute posthemorrhagic anemia: Secondary | ICD-10-CM | POA: Diagnosis present

## 2020-07-20 DIAGNOSIS — D519 Vitamin B12 deficiency anemia, unspecified: Secondary | ICD-10-CM | POA: Diagnosis present

## 2020-07-20 DIAGNOSIS — E1165 Type 2 diabetes mellitus with hyperglycemia: Secondary | ICD-10-CM

## 2020-07-20 DIAGNOSIS — Z8673 Personal history of transient ischemic attack (TIA), and cerebral infarction without residual deficits: Secondary | ICD-10-CM

## 2020-07-20 DIAGNOSIS — Z7901 Long term (current) use of anticoagulants: Secondary | ICD-10-CM

## 2020-07-20 DIAGNOSIS — K2101 Gastro-esophageal reflux disease with esophagitis, with bleeding: Secondary | ICD-10-CM | POA: Diagnosis present

## 2020-07-20 DIAGNOSIS — D72828 Other elevated white blood cell count: Secondary | ICD-10-CM | POA: Diagnosis present

## 2020-07-20 DIAGNOSIS — Z79899 Other long term (current) drug therapy: Secondary | ICD-10-CM

## 2020-07-20 DIAGNOSIS — N179 Acute kidney failure, unspecified: Secondary | ICD-10-CM | POA: Diagnosis present

## 2020-07-20 DIAGNOSIS — I5022 Chronic systolic (congestive) heart failure: Secondary | ICD-10-CM | POA: Diagnosis present

## 2020-07-20 DIAGNOSIS — Z794 Long term (current) use of insulin: Secondary | ICD-10-CM

## 2020-07-20 DIAGNOSIS — E785 Hyperlipidemia, unspecified: Secondary | ICD-10-CM | POA: Diagnosis present

## 2020-07-20 DIAGNOSIS — Z20822 Contact with and (suspected) exposure to covid-19: Secondary | ICD-10-CM | POA: Diagnosis present

## 2020-07-20 DIAGNOSIS — Z6841 Body Mass Index (BMI) 40.0 and over, adult: Secondary | ICD-10-CM

## 2020-07-20 DIAGNOSIS — E1110 Type 2 diabetes mellitus with ketoacidosis without coma: Secondary | ICD-10-CM | POA: Diagnosis not present

## 2020-07-20 DIAGNOSIS — E1310 Other specified diabetes mellitus with ketoacidosis without coma: Secondary | ICD-10-CM

## 2020-07-20 DIAGNOSIS — N184 Chronic kidney disease, stage 4 (severe): Secondary | ICD-10-CM | POA: Diagnosis present

## 2020-07-20 DIAGNOSIS — E1143 Type 2 diabetes mellitus with diabetic autonomic (poly)neuropathy: Secondary | ICD-10-CM | POA: Diagnosis present

## 2020-07-20 DIAGNOSIS — R112 Nausea with vomiting, unspecified: Secondary | ICD-10-CM

## 2020-07-20 DIAGNOSIS — K3184 Gastroparesis: Secondary | ICD-10-CM | POA: Diagnosis present

## 2020-07-20 DIAGNOSIS — M109 Gout, unspecified: Secondary | ICD-10-CM | POA: Diagnosis present

## 2020-07-20 DIAGNOSIS — I10 Essential (primary) hypertension: Secondary | ICD-10-CM | POA: Diagnosis present

## 2020-07-20 DIAGNOSIS — Z85060 Personal history of malignant carcinoid tumor of small intestine: Secondary | ICD-10-CM

## 2020-07-20 DIAGNOSIS — I480 Paroxysmal atrial fibrillation: Secondary | ICD-10-CM | POA: Diagnosis present

## 2020-07-20 DIAGNOSIS — M797 Fibromyalgia: Secondary | ICD-10-CM | POA: Diagnosis present

## 2020-07-20 DIAGNOSIS — N1832 Chronic kidney disease, stage 3b: Secondary | ICD-10-CM | POA: Diagnosis present

## 2020-07-20 DIAGNOSIS — E1122 Type 2 diabetes mellitus with diabetic chronic kidney disease: Secondary | ICD-10-CM | POA: Diagnosis present

## 2020-07-20 DIAGNOSIS — I130 Hypertensive heart and chronic kidney disease with heart failure and stage 1 through stage 4 chronic kidney disease, or unspecified chronic kidney disease: Secondary | ICD-10-CM | POA: Diagnosis present

## 2020-07-20 DIAGNOSIS — R079 Chest pain, unspecified: Secondary | ICD-10-CM

## 2020-07-20 DIAGNOSIS — E860 Dehydration: Secondary | ICD-10-CM | POA: Diagnosis present

## 2020-07-20 DIAGNOSIS — Z833 Family history of diabetes mellitus: Secondary | ICD-10-CM

## 2020-07-20 DIAGNOSIS — K2100 Gastro-esophageal reflux disease with esophagitis, without bleeding: Secondary | ICD-10-CM | POA: Diagnosis present

## 2020-07-20 DIAGNOSIS — I428 Other cardiomyopathies: Secondary | ICD-10-CM | POA: Diagnosis present

## 2020-07-20 DIAGNOSIS — N182 Chronic kidney disease, stage 2 (mild): Secondary | ICD-10-CM | POA: Diagnosis present

## 2020-07-20 DIAGNOSIS — K920 Hematemesis: Secondary | ICD-10-CM | POA: Diagnosis present

## 2020-07-20 MED ORDER — ONDANSETRON HCL 4 MG/2ML IJ SOLN: 4.0000 mg | Freq: Once | INTRAMUSCULAR | Status: DC | PRN

## 2020-07-20 NOTE — ED Notes (Signed)
Patient noted to be sticking her fingers down her throat, PA aware

## 2020-07-20 NOTE — ED Triage Notes (Signed)
Patient arrives complaining of nausea, vomiting and abdominal pain for 3 days with some chest pain. Patient states this is a reoccurring issue. Chest pain is worse with inspiration and palpation.

## 2020-07-20 NOTE — ED Provider Notes (Signed)
Emergency Medicine Provider Triage Evaluation Note  Deborah [name] , a 55 y.o. female  was evaluated in triage.  When I walked in the room, patient was gagging herself.  I asked her why she said she thinks that something is stuck in her throat.  Her eyes are wide open and she is breathing hard.  Her vital signs are largely reassuring aside from tachycardia.  She states that for the past 3 hours she has been experiencing difficulty breathing.  She has been having nausea and emesis all day.  She says she felt pretty well yesterday.  She is having sharp chest and abdominal pain, nonfocal.  She states that this is happened in the past.  Denies any fevers or cough.  Review of Systems  Positive: Chest and abdominal pain.  Nausea and vomiting. Negative: Hematemesis, fevers or chills, sick contacts, cough.  Physical Exam  BP (!) 144/86 (BP Location: Left Arm)   Pulse (!) 125   Temp 98.4 F (36.9 C) (Oral)   Resp 20   Ht 5\' 6"  (1.676 m)   Wt 135.6 kg   LMP 10/10/2012   SpO2 98%   BMI 48.26 kg/m  Gen:   Awake, gagging herself.  Resp:  Mildly increased work of breathing.  CTA. MSK:   Moves extremities without difficulty   Medical Decision Making  Medically screening exam initiated at 11:28 PM.  Appropriate orders placed.  TAYLLOR BREITENSTEIN was informed that the remainder of the evaluation will be completed by another provider, this initial triage assessment does not replace that evaluation, and the importance of remaining in the ED until their evaluation is complete.    Corena Herter, PA-C 07/20/20 [id number]    Orpah Greek, MD 07/21/20 [phone]

## 2020-07-21 ENCOUNTER — Encounter (HOSPITAL_COMMUNITY): Payer: Self-pay | Admitting: Internal Medicine

## 2020-07-21 DIAGNOSIS — N179 Acute kidney failure, unspecified: Secondary | ICD-10-CM | POA: Diagnosis present

## 2020-07-21 DIAGNOSIS — E1110 Type 2 diabetes mellitus with ketoacidosis without coma: Secondary | ICD-10-CM | POA: Diagnosis present

## 2020-07-21 DIAGNOSIS — I10 Essential (primary) hypertension: Secondary | ICD-10-CM | POA: Diagnosis not present

## 2020-07-21 DIAGNOSIS — E0810 Diabetes mellitus due to underlying condition with ketoacidosis without coma: Secondary | ICD-10-CM

## 2020-07-21 LAB — BASIC METABOLIC PANEL
Anion gap: 25 — ABNORMAL HIGH (ref 5–15)
Anion gap: 18 — ABNORMAL HIGH (ref 5–15)
Anion gap: 14 (ref 5–15)
Anion gap: 11 (ref 5–15)
BUN: 69 mg/dL — ABNORMAL HIGH (ref 6–20)
BUN: 65 mg/dL — ABNORMAL HIGH (ref 6–20)
BUN: 63 mg/dL — ABNORMAL HIGH (ref 6–20)
BUN: 61 mg/dL — ABNORMAL HIGH (ref 6–20)
CO2: 13 mmol/L — ABNORMAL LOW (ref 22–32)
CO2: 19 mmol/L — ABNORMAL LOW (ref 22–32)
CO2: 19 mmol/L — ABNORMAL LOW (ref 22–32)
CO2: 23 mmol/L (ref 22–32)
Calcium: 8.7 mg/dL — ABNORMAL LOW (ref 8.9–10.3)
Calcium: 8.6 mg/dL — ABNORMAL LOW (ref 8.9–10.3)
Calcium: 8.8 mg/dL — ABNORMAL LOW (ref 8.9–10.3)
Calcium: 8.7 mg/dL — ABNORMAL LOW (ref 8.9–10.3)
Chloride: 96 mmol/L — ABNORMAL LOW (ref 98–111)
Chloride: 96 mmol/L — ABNORMAL LOW (ref 98–111)
Chloride: 100 mmol/L (ref 98–111)
Chloride: 102 mmol/L (ref 98–111)
Creatinine, Ser: 4.13 mg/dL — ABNORMAL HIGH (ref 0.44–1.00)
Creatinine, Ser: 4 mg/dL — ABNORMAL HIGH (ref 0.44–1.00)
Creatinine, Ser: 3.33 mg/dL — ABNORMAL HIGH (ref 0.44–1.00)
Creatinine, Ser: 3.32 mg/dL — ABNORMAL HIGH (ref 0.44–1.00)
GFR, Estimated: 12 mL/min — ABNORMAL LOW (ref >60)
GFR, Estimated: 13 mL/min — ABNORMAL LOW (ref >60)
GFR, Estimated: 16 mL/min — ABNORMAL LOW (ref >60)
GFR, Estimated: 16 mL/min — ABNORMAL LOW (ref >60)
Glucose, Bld: 480 mg/dL — ABNORMAL HIGH (ref 70–99)
Glucose, Bld: 466 mg/dL — ABNORMAL HIGH (ref 70–99)
Glucose, Bld: 285 mg/dL — ABNORMAL HIGH (ref 70–99)
Glucose, Bld: 215 mg/dL — ABNORMAL HIGH (ref 70–99)
Potassium: 3.9 mmol/L (ref 3.5–5.1)
Potassium: 3.5 mmol/L (ref 3.5–5.1)
Potassium: 4.1 mmol/L (ref 3.5–5.1)
Potassium: 3.4 mmol/L — ABNORMAL LOW (ref 3.5–5.1)
Sodium: 134 mmol/L — ABNORMAL LOW (ref 135–145)
Sodium: 133 mmol/L — ABNORMAL LOW (ref 135–145)
Sodium: 133 mmol/L — ABNORMAL LOW (ref 135–145)
Sodium: 136 mmol/L (ref 135–145)

## 2020-07-21 LAB — BLOOD GAS, VENOUS
Acid-base deficit: 9.8 mmol/L — ABNORMAL HIGH (ref 0.0–2.0)
Bicarbonate: 15.1 mmol/L — ABNORMAL LOW (ref 20.0–28.0)
O2 Saturation: 63.9 %
Patient temperature: 98.6
pCO2, Ven: 30.7 mmHg — ABNORMAL LOW (ref 44.0–60.0)
pH, Ven: 7.311 (ref 7.250–7.430)
pO2, Ven: 40.4 mmHg (ref 32.0–45.0)

## 2020-07-21 LAB — BETA-HYDROXYBUTYRIC ACID
Beta-Hydroxybutyric Acid: 5.82 mmol/L — ABNORMAL HIGH (ref 0.05–0.27)
Beta-Hydroxybutyric Acid: 5.02 mmol/L — ABNORMAL HIGH (ref 0.05–0.27)

## 2020-07-21 LAB — URINALYSIS, ROUTINE W REFLEX MICROSCOPIC
Bilirubin Urine: NEGATIVE
Glucose, UA: >=500 mg/dL — AB
Ketones, ur: 20 mg/dL — AB
Leukocytes,Ua: NEGATIVE
Nitrite: NEGATIVE
Protein, ur: 100 mg/dL — AB
Specific Gravity, Urine: 1.007 (ref 1.005–1.030)
pH: 5 (ref 5.0–8.0)

## 2020-07-21 LAB — COMPREHENSIVE METABOLIC PANEL
ALT: 8 U/L (ref 0–44)
AST: 14 U/L — ABNORMAL LOW (ref 15–41)
Albumin: 3.2 g/dL — ABNORMAL LOW (ref 3.5–5.0)
Alkaline Phosphatase: 99 U/L (ref 38–126)
Anion gap: 24 — ABNORMAL HIGH (ref 5–15)
BUN: 65 mg/dL — ABNORMAL HIGH (ref 6–20)
CO2: 13 mmol/L — ABNORMAL LOW (ref 22–32)
Calcium: 9 mg/dL (ref 8.9–10.3)
Chloride: 95 mmol/L — ABNORMAL LOW (ref 98–111)
Creatinine, Ser: 4.38 mg/dL — ABNORMAL HIGH (ref 0.44–1.00)
GFR, Estimated: 11 mL/min — ABNORMAL LOW (ref >60)
Glucose, Bld: 390 mg/dL — ABNORMAL HIGH (ref 70–99)
Potassium: 3.4 mmol/L — ABNORMAL LOW (ref 3.5–5.1)
Sodium: 132 mmol/L — ABNORMAL LOW (ref 135–145)
Total Bilirubin: 1.3 mg/dL — ABNORMAL HIGH (ref 0.3–1.2)
Total Protein: 8.2 g/dL — ABNORMAL HIGH (ref 6.5–8.1)

## 2020-07-21 LAB — GLUCOSE, CAPILLARY
Glucose-Capillary: 430 mg/dL — ABNORMAL HIGH (ref 70–99)
Glucose-Capillary: 412 mg/dL — ABNORMAL HIGH (ref 70–99)
Glucose-Capillary: 346 mg/dL — ABNORMAL HIGH (ref 70–99)
Glucose-Capillary: 278 mg/dL — ABNORMAL HIGH (ref 70–99)
Glucose-Capillary: 252 mg/dL — ABNORMAL HIGH (ref 70–99)
Glucose-Capillary: 216 mg/dL — ABNORMAL HIGH (ref 70–99)
Glucose-Capillary: 216 mg/dL — ABNORMAL HIGH (ref 70–99)
Glucose-Capillary: 203 mg/dL — ABNORMAL HIGH (ref 70–99)
Glucose-Capillary: 190 mg/dL — ABNORMAL HIGH (ref 70–99)
Glucose-Capillary: 198 mg/dL — ABNORMAL HIGH (ref 70–99)
Glucose-Capillary: 203 mg/dL — ABNORMAL HIGH (ref 70–99)
Glucose-Capillary: 195 mg/dL — ABNORMAL HIGH (ref 70–99)
Glucose-Capillary: 156 mg/dL — ABNORMAL HIGH (ref 70–99)
Glucose-Capillary: 181 mg/dL — ABNORMAL HIGH (ref 70–99)

## 2020-07-21 LAB — CBC
HCT: 25.8 % — ABNORMAL LOW (ref 36.0–46.0)
Hemoglobin: 8.3 g/dL — ABNORMAL LOW (ref 12.0–15.0)
MCH: 28.3 pg (ref 26.0–34.0)
MCHC: 32.2 g/dL (ref 30.0–36.0)
MCV: 88.1 fL (ref 80.0–100.0)
Platelets: 570 10*3/uL — ABNORMAL HIGH (ref 150–400)
RBC: 2.93 MIL/uL — ABNORMAL LOW (ref 3.87–5.11)
RDW: 14.3 % (ref 11.5–15.5)
WBC: 19.3 10*3/uL — ABNORMAL HIGH (ref 4.0–10.5)
nRBC: 0 % (ref 0.0–0.2)

## 2020-07-21 LAB — HCG, QUANTITATIVE, PREGNANCY: hCG, Beta Chain, Quant, S: 1 m[IU]/mL (ref <5)

## 2020-07-21 LAB — MRSA PCR SCREENING: MRSA by PCR: POSITIVE — AB

## 2020-07-21 LAB — HEMOGLOBIN A1C
Hgb A1c MFr Bld: 10.5 % — ABNORMAL HIGH (ref 4.8–5.6)
Mean Plasma Glucose: 254.65 mg/dL

## 2020-07-21 LAB — RESP PANEL BY RT-PCR (FLU A&B, COVID) ARPGX2
Influenza A by PCR: NEGATIVE
Influenza B by PCR: NEGATIVE
SARS Coronavirus 2 by RT PCR: NEGATIVE

## 2020-07-21 LAB — LIPASE, BLOOD: Lipase: 24 U/L (ref 11–51)

## 2020-07-21 LAB — CBG MONITORING, ED: Glucose-Capillary: 459 mg/dL — ABNORMAL HIGH (ref 70–99)

## 2020-07-21 LAB — TROPONIN I (HIGH SENSITIVITY): Troponin I (High Sensitivity): 18 ng/L — ABNORMAL HIGH (ref <18)

## 2020-07-21 MED ORDER — HYDROMORPHONE HCL 2 MG/ML IJ SOLN
2.0000 mg | Freq: Once | INTRAMUSCULAR | Status: AC
Start: 2020-07-21 — End: 2020-07-21
  Administered 2020-07-21: 2 mg via INTRAVENOUS
  Filled 2020-07-21: qty 1

## 2020-07-21 MED ORDER — INSULIN GLARGINE 100 UNIT/ML ~~LOC~~ SOLN
20.0000 [IU] | Freq: Two times a day (BID) | SUBCUTANEOUS | Status: DC
  Administered 2020-07-21 – 2020-07-22 (×3): 20 [IU] via SUBCUTANEOUS
  Filled 2020-07-21 (×4): qty 0.2

## 2020-07-21 MED ORDER — APIXABAN 5 MG PO TABS
5.0000 mg | ORAL_TABLET | Freq: Two times a day (BID) | ORAL | Status: DC
  Administered 2020-07-21 – 2020-07-24 (×7): 5 mg via ORAL
  Filled 2020-07-21 (×7): qty 1

## 2020-07-21 MED ORDER — HALOPERIDOL LACTATE 5 MG/ML IJ SOLN
2.0000 mg | Freq: Once | INTRAMUSCULAR | Status: AC
  Administered 2020-07-21: 2 mg via INTRAVENOUS
  Filled 2020-07-21: qty 1

## 2020-07-21 MED ORDER — DULOXETINE HCL 20 MG PO CPEP
40.0000 mg | ORAL_CAPSULE | Freq: Every day | ORAL | Status: DC
  Administered 2020-07-21 – 2020-07-27 (×7): 40 mg via ORAL
  Filled 2020-07-21 (×7): qty 2

## 2020-07-21 MED ORDER — INSULIN ASPART 100 UNIT/ML IJ SOLN
0.0000 [IU] | Freq: Every day | INTRAMUSCULAR | Status: DC
  Administered 2020-07-22 – 2020-07-24 (×2): 2 [IU] via SUBCUTANEOUS
  Administered 2020-07-25 – 2020-07-26 (×2): 3 [IU] via SUBCUTANEOUS

## 2020-07-21 MED ORDER — LACTATED RINGERS IV BOLUS
20.0000 mL/kg | Freq: Once | INTRAVENOUS | Status: AC
  Administered 2020-07-21: 1000 mL via INTRAVENOUS

## 2020-07-21 MED ORDER — SODIUM CHLORIDE 0.9 % IV SOLN
12.5000 mg | Freq: Four times a day (QID) | INTRAVENOUS | Status: DC | PRN
  Administered 2020-07-21: 12.5 mg via INTRAVENOUS
  Filled 2020-07-21: qty 12.5

## 2020-07-21 MED ORDER — POTASSIUM CHLORIDE 10 MEQ/100ML IV SOLN
10.0000 meq | INTRAVENOUS | Status: AC
  Administered 2020-07-21 (×4): 10 meq via INTRAVENOUS
  Filled 2020-07-21 (×4): qty 100

## 2020-07-21 MED ORDER — MORPHINE SULFATE (PF) 2 MG/ML IV SOLN
2.0000 mg | INTRAVENOUS | Status: DC | PRN
  Administered 2020-07-21 – 2020-07-22 (×4): 2 mg via INTRAVENOUS
  Filled 2020-07-21 (×5): qty 1

## 2020-07-21 MED ORDER — METOPROLOL SUCCINATE ER 25 MG PO TB24
25.0000 mg | ORAL_TABLET | Freq: Every day | ORAL | Status: DC
  Administered 2020-07-21: 25 mg via ORAL
  Filled 2020-07-21: qty 1

## 2020-07-21 MED ORDER — CHLORHEXIDINE GLUCONATE CLOTH 2 % EX PADS: 6.0000 | MEDICATED_PAD | Freq: Every day | CUTANEOUS | Status: DC

## 2020-07-21 MED ORDER — LACTATED RINGERS IV SOLN: INTRAVENOUS | Status: DC

## 2020-07-21 MED ORDER — ATORVASTATIN CALCIUM 10 MG PO TABS
10.0000 mg | ORAL_TABLET | Freq: Every day | ORAL | Status: DC
  Administered 2020-07-21 – 2020-07-27 (×7): 10 mg via ORAL
  Filled 2020-07-21 (×7): qty 1

## 2020-07-21 MED ORDER — MUPIROCIN 2 % EX OINT
1.0000 "application " | TOPICAL_OINTMENT | Freq: Two times a day (BID) | CUTANEOUS | Status: AC
  Administered 2020-07-21 – 2020-07-25 (×10): 1 via NASAL
  Filled 2020-07-21 (×5): qty 22

## 2020-07-21 MED ORDER — DEXTROSE 50 % IV SOLN: 0.0000 mL | INTRAVENOUS | Status: DC | PRN

## 2020-07-21 MED ORDER — HYDRALAZINE HCL 25 MG PO TABS
25.0000 mg | ORAL_TABLET | Freq: Three times a day (TID) | ORAL | Status: DC
  Administered 2020-07-21 (×3): 25 mg via ORAL
  Filled 2020-07-21 (×4): qty 1

## 2020-07-21 MED ORDER — ALPRAZOLAM 1 MG PO TABS
1.0000 mg | ORAL_TABLET | Freq: Two times a day (BID) | ORAL | Status: DC | PRN
Start: 2020-07-21 — End: 2020-07-27
  Administered 2020-07-21 – 2020-07-25 (×5): 1 mg via ORAL
  Filled 2020-07-21 (×6): qty 1

## 2020-07-21 MED ORDER — INSULIN REGULAR(HUMAN) IN NACL 100-0.9 UT/100ML-% IV SOLN
INTRAVENOUS | Status: DC
  Administered 2020-07-21: 9.5 [IU]/h via INTRAVENOUS
  Filled 2020-07-21: qty 100

## 2020-07-21 MED ORDER — DEXTROSE IN LACTATED RINGERS 5 % IV SOLN: INTRAVENOUS | Status: DC

## 2020-07-21 MED ORDER — SODIUM CHLORIDE 0.9 % IV BOLUS
1000.0000 mL | Freq: Once | INTRAVENOUS | Status: AC
  Administered 2020-07-21: 1000 mL via INTRAVENOUS

## 2020-07-21 MED ORDER — PANTOPRAZOLE SODIUM 40 MG PO TBEC
40.0000 mg | DELAYED_RELEASE_TABLET | Freq: Two times a day (BID) | ORAL | Status: DC
  Administered 2020-07-21 – 2020-07-24 (×7): 40 mg via ORAL
  Filled 2020-07-21 (×7): qty 1

## 2020-07-21 MED ORDER — LABETALOL HCL 5 MG/ML IV SOLN
10.0000 mg | INTRAVENOUS | Status: DC | PRN
  Administered 2020-07-21: 10 mg via INTRAVENOUS
  Filled 2020-07-21: qty 4

## 2020-07-21 MED ORDER — ONDANSETRON HCL 4 MG/2ML IJ SOLN
4.0000 mg | Freq: Four times a day (QID) | INTRAMUSCULAR | Status: DC | PRN
  Administered 2020-07-21 – 2020-07-27 (×8): 4 mg via INTRAVENOUS
  Filled 2020-07-21 (×8): qty 2

## 2020-07-21 MED ORDER — INSULIN REGULAR(HUMAN) IN NACL 100-0.9 UT/100ML-% IV SOLN
INTRAVENOUS | Status: DC
  Administered 2020-07-21: 11.5 [IU]/h via INTRAVENOUS
  Filled 2020-07-21: qty 100

## 2020-07-21 MED ORDER — CHLORHEXIDINE GLUCONATE CLOTH 2 % EX PADS
6.0000 | MEDICATED_PAD | Freq: Every day | CUTANEOUS | Status: AC
  Administered 2020-07-21 – 2020-07-25 (×4): 6 via TOPICAL

## 2020-07-21 MED ORDER — POTASSIUM CHLORIDE CRYS ER 20 MEQ PO TBCR
40.0000 meq | EXTENDED_RELEASE_TABLET | Freq: Once | ORAL | Status: AC
  Administered 2020-07-21: 40 meq via ORAL
  Filled 2020-07-21: qty 2

## 2020-07-21 MED ORDER — INSULIN ASPART 100 UNIT/ML IJ SOLN
0.0000 [IU] | Freq: Three times a day (TID) | INTRAMUSCULAR | Status: DC
  Administered 2020-07-22: 15 [IU] via SUBCUTANEOUS
  Administered 2020-07-22: 11 [IU] via SUBCUTANEOUS
  Administered 2020-07-22: 7 [IU] via SUBCUTANEOUS
  Administered 2020-07-23: 11 [IU] via SUBCUTANEOUS
  Administered 2020-07-23: 15 [IU] via SUBCUTANEOUS
  Administered 2020-07-23: 4 [IU] via SUBCUTANEOUS
  Administered 2020-07-24: 11 [IU] via SUBCUTANEOUS
  Administered 2020-07-24: 7 [IU] via SUBCUTANEOUS
  Administered 2020-07-25: 20 [IU] via SUBCUTANEOUS
  Administered 2020-07-25: 4 [IU] via SUBCUTANEOUS
  Administered 2020-07-25: 7 [IU] via SUBCUTANEOUS
  Administered 2020-07-26: 15 [IU] via SUBCUTANEOUS
  Administered 2020-07-26: 7 [IU] via SUBCUTANEOUS
  Administered 2020-07-27: 11 [IU] via SUBCUTANEOUS
  Administered 2020-07-27: 15 [IU] via SUBCUTANEOUS

## 2020-07-21 MED ORDER — ISOSORBIDE MONONITRATE ER 60 MG PO TB24
60.0000 mg | ORAL_TABLET | Freq: Every day | ORAL | Status: DC
  Administered 2020-07-21 – 2020-07-27 (×7): 60 mg via ORAL
  Filled 2020-07-21 (×7): qty 1

## 2020-07-21 NOTE — ED Provider Notes (Signed)
[facility] DEPT Provider Note   [name]: [id number] Arrival date & time: 07/20/20  2244     History Chief Complaint  Patient presents with  . Abdominal Pain  . Emesis    Deborah [name] is a 55 y.o. female.  Patient presents to the emergency department for evaluation of nausea, vomiting and abdominal pain.  Patient reports that she is having severe diffuse abdominal pain that has been ongoing all day.  She reports associated nausea and vomiting, no diarrhea.  No hematemesis or melena.        Past Medical History:  Diagnosis Date  . Acute back pain with sciatica, left   . Acute back pain with sciatica, right   . AKI (acute kidney injury) ([employer])   . Anemia, unspecified   . Cancer ([place])   . Carcinoid tumor of duodenum   . Chronic pain   . Chronic systolic CHF (congestive heart failure) ([place])   . Diabetes mellitus   . Esophageal reflux   . Fibromyalgia   . Gastric ulcer   . Gastroparesis   . Gout   . Hyperlipidemia   . Hypertension   . Lumbosacral stenosis   . NICM (nonischemic cardiomyopathy) ([place])   . Obesity   . PAF (paroxysmal atrial fibrillation) ([place])   . Stroke ([place]) 02/2011  . Vitamin B12 deficiency anemia     Patient Active Problem List   Diagnosis Date Noted  . DKA (diabetic ketoacidosis) ([place]) 07/21/2020  . ARF (acute renal failure) ([place]) 07/21/2020  . Vaginal yeast infection 01/04/2020  . AKI (acute kidney injury) ([place]) 11/27/2019  . Chronic pain 09/04/2019  . Malignant carcinoid tumor of duodenum ([place])   . Nonalcoholic fatty liver disease 06/05/2019  . Chronic diarrhea   . COPD exacerbation ([place]) 05/08/2019  . Acute on chronic HFrEF (heart failure with reduced ejection fraction) ([place])   . Restrictive lung disease secondary to obesity   . History of gastric ulcer   . Uncontrolled type 2 diabetes mellitus with hyperglycemia ([place])   . Fibroid uterus 02/23/2019  . Chronic systolic CHF (congestive heart failure)  ([place]) 12/19/2018  . Intractable nausea and vomiting 12/17/2018  . Hypoxia 12/17/2018  . Abdominal pain 07/17/2018  . Cardiac arrest ([place]) 11/29/2017  . Leukocytosis 11/29/2017  . Anxiety 11/29/2017  . Stroke (cerebrum) ([place]) 03/19/2017  . GERD (gastroesophageal reflux disease) 03/19/2017  . Depression 03/19/2017  . Morbid obesity ([place])   . Normocytic normochromic anemia 08/16/2016  . Diabetic gastroparesis ([place]) 06/05/2016  . Gout 06/05/2016  . Nonspecific chest pain 09/26/2015  . Hypokalemia 09/26/2015  . Hypomagnesemia 09/26/2015  . Nausea and vomiting 08/20/2015  . DKA (diabetic ketoacidoses) 05/25/2015  . Uncontrolled type 2 diabetes mellitus with diabetic neuropathy, with long-term current use of insulin ([place]) 05/25/2015  . Type 2 diabetes mellitus with hyperglycemia, with long-term current use of insulin ([place]) 05/25/2015  . CKD (chronic kidney disease), stage II 05/25/2015  . Essential hypertension, benign 09/28/2013    Past Surgical History:  Procedure Laterality Date  . BIOPSY  07/27/2019   Procedure: BIOPSY;  Surgeon: Clarene Essex, MD;  Location: [facility];  Service: Endoscopy;;  . BIOPSY  07/30/2019   Procedure: BIOPSY;  Surgeon: Otis Brace, MD;  Location: [facility];  Service: Gastroenterology;;  . CATARACT EXTRACTION  01/2014  . CHOLECYSTECTOMY    . COLONOSCOPY WITH PROPOFOL N/A 07/30/2019   Procedure: COLONOSCOPY WITH PROPOFOL;  Surgeon: Otis Brace, MD;  Location: [facility];  Service: Gastroenterology;  Laterality: N/A;  .  ESOPHAGOGASTRODUODENOSCOPY N/A 07/27/2019   Procedure: ESOPHAGOGASTRODUODENOSCOPY (EGD);  Surgeon: Clarene Essex, MD;  Location: [facility];  Service: Endoscopy;  Laterality: N/A;  . ESOPHAGOGASTRODUODENOSCOPY (EGD) WITH PROPOFOL N/A 08/02/2019   Procedure: ESOPHAGOGASTRODUODENOSCOPY (EGD) WITH PROPOFOL;  Surgeon: Otis Brace, MD;  Location: [facility];  Service: Gastroenterology;  Laterality: N/A;  . HEMOSTASIS CLIP  PLACEMENT  08/02/2019   Procedure: HEMOSTASIS CLIP PLACEMENT;  Surgeon: Otis Brace, MD;  Location: [facility];  Service: Gastroenterology;;  . POLYPECTOMY  07/30/2019   Procedure: POLYPECTOMY;  Surgeon: Otis Brace, MD;  Location: [facility];  Service: Gastroenterology;;  . POLYPECTOMY  08/02/2019   Procedure: POLYPECTOMY;  Surgeon: Otis Brace, MD;  Location: [facility];  Service: Gastroenterology;;     OB History    Gravida  0   Para  0   Term  0   Preterm  0   AB  0   Living  0     SAB  0   IAB  0   Ectopic  0   Multiple  0   Live Births  0           Family History  Problem Relation Age of Onset  . Diabetes Mother   . Diabetes Father   . Heart disease Father   . Diabetes Sister   . Congestive Heart Failure Sister 35  . Diabetes Brother     Social History   Tobacco Use  . Smoking status: Never Smoker  . Smokeless tobacco: Never Used  Vaping Use  . Vaping Use: Never used  Substance Use Topics  . Alcohol use: No  . Drug use: No    Home Medications Prior to Admission medications   Medication Sig Start Date End Date Taking? Authorizing Provider  albuterol (PROVENTIL) (2.5 MG/3ML) 0.083% nebulizer solution Take 3 mLs (2.5 mg total) by nebulization every 6 (six) hours as needed for wheezing or shortness of breath. 04/06/19   Scot Jun, FNP  allopurinol (ZYLOPRIM) 100 MG tablet Take 100 mg by mouth daily.    [provider]  ALPRAZolam Duanne Moron) 1 MG tablet Take 1 tablet (1 mg total) by mouth 2 (two) times daily as needed for anxiety. 09/03/19   Lockamy, Christia Reading, DO  atorvastatin (LIPITOR) 10 MG tablet TAKE ONE TABLET BY MOUTH ONCE DAILY (AM) Patient taking differently: Take 10 mg by mouth daily. 11/14/19   Meccariello, Bernita Raisin, DO  blood glucose meter kit and supplies KIT Dispense based on patient and insurance preference. Use up to four times daily as directed. (FOR ICD-9 250.00, 250.01). Patient taking differently:  Inject 1 each into the skin See admin instructions. Dispense based on patient and insurance preference. Use up to four times daily as directed. (FOR ICD-9 250.00, 250.01). 12/13/18   Lockamy, Christia Reading, DO  DULoxetine HCl 40 MG CPEP TAKE ONE CAPSULE BY MOUTH TWICE DAILY Patient taking differently: Take 40 mg by mouth in the morning and at bedtime. 05/06/20   Meccariello, Bernita Raisin, DO  EASY COMFORT PEN NEEDLES 31G X 5 MM MISC USE 3 TIMES A DAY FOR INSULIN ADMINISTRATION Patient taking differently: 1 each by Other route 3 (three) times daily. 11/14/19   Meccariello, Mel Almond J, DO  ELIQUIS 5 MG TABS tablet TAKE 1 TABLET BY MOUTH 2 (TWO) TIMES DAILY. Patient taking differently: Take 5 mg by mouth 2 (two) times daily. 12/20/19   Meccariello, Bernita Raisin, DO  FARXIGA 10 MG TABS tablet Take 10 mg by mouth daily. 10/02/19   [provider]  ferrous sulfate 325 (65 FE) MG tablet Take 1 tablet (325 mg total) by mouth daily with breakfast. Patient not taking: Reported on 03/28/2020 09/01/19 01/23/20  Alma Friendly, MD  fluconazole (DIFLUCAN) 50 MG tablet Take 1 tablet (50 mg total) by mouth daily. 05/05/20   Raulkar, Clide Deutscher, MD  fluticasone (FLONASE) 50 MCG/ACT nasal spray Place 2 sprays into both nostrils daily as needed for allergies or rhinitis. 12/19/18   Rai, Vernelle Emerald, MD  hydrALAZINE (APRESOLINE) 25 MG tablet Take 1 tablet (25 mg total) by mouth 3 (three) times daily. 05/07/20   Meccariello, Bernita Raisin, DO  HYDROmorphone (DILAUDID) 2 MG tablet Take 1 tablet (2 mg total) by mouth daily as needed for severe pain. 07/01/20   Raulkar, Clide Deutscher, MD  hydrOXYzine (ATARAX/VISTARIL) 10 MG tablet Take 1 tablet (10 mg total) by mouth 3 (three) times daily as needed. 11/15/19   Meccariello, Bernita Raisin, DO  isosorbide mononitrate (IMDUR) 60 MG 24 hr tablet TAKE 1 TABLET (60 MG TOTAL) BY MOUTH DAILY. 07/01/20   Meccariello, Bernita Raisin, DO  LANTUS SOLOSTAR 100 UNIT/ML Solostar Pen Inject 32 Units into the skin at  bedtime. Patient taking differently: Inject 50 Units into the skin at bedtime. 01/28/20 02/29/20  Raiford Noble Latif, DO  melatonin 3 MG TABS tablet Take 2 tablets (6 mg total) by mouth at bedtime. 02/04/20   Meccariello, Bernita Raisin, DO  metoCLOPramide (REGLAN) 10 MG tablet Take 10 mg by mouth 4 (four) times daily.    [provider]  metoprolol succinate (TOPROL-XL) 25 MG 24 hr tablet Take 1 tablet (25 mg total) by mouth daily. 02/04/20   Meccariello, Bernita Raisin, DO  nitroGLYCERIN (NITROSTAT) 0.4 MG SL tablet Place 1 tablet (0.4 mg total) under the tongue every 5 (five) minutes as needed for chest pain. Patient taking differently: Place 0.4 mg under the tongue every 5 (five) minutes as needed for chest pain (max 3 doses). 12/13/18   Lockamy, Timothy, DO  NOVOLOG FLEXPEN 100 UNIT/ML FlexPen Inject 30 Units into the skin in the morning, at noon, in the evening, and at bedtime. 02/04/20   Meccariello, Bernita Raisin, DO  ondansetron (ZOFRAN) 4 MG tablet Take 1 tablet (4 mg total) by mouth every 6 (six) hours as needed for nausea. 07/01/20   Raulkar, Clide Deutscher, MD  pantoprazole (PROTONIX) 40 MG tablet TAKE ONE TABLET BY MOUTH TWICE DAILY Patient taking differently: Take 40 mg by mouth 2 (two) times daily. 09/04/19   [name], DO  potassium chloride (KLOR-CON) 10 MEQ tablet Take 1 tablet (10 mEq total) by mouth daily for 5 days. 05/25/20 05/30/20  Eustaquio Maize, PA-C  torsemide (DEMADEX) 20 MG tablet Take 2 tablets (40 mg total) by mouth daily. 07/01/20   Meccariello, Bernita Raisin, DO    Allergies    Diazepam, Isovue [iopamidol], Lisinopril, Metoclopramide, Penicillins, Tolmetin, Aspirin, Nsaids, Acetaminophen, and Tramadol  Review of Systems   Review of Systems  Gastrointestinal: Positive for abdominal pain, nausea and vomiting.  All other systems reviewed and are negative.   Physical Exam Updated Vital Signs BP (!) 178/102 (BP Location: Left Arm)   Pulse (!) 121   Temp 98.4 F (36.9 C)  (Oral)   Resp 18   Ht '5\' 6"'  (1.676 m)   Wt 135.6 kg   LMP 10/10/2012   SpO2 100%   BMI 48.26 kg/m   Physical Exam Vitals and nursing note reviewed.  Constitutional:      General: She is in acute distress.  Appearance: Normal appearance. She is well-developed.  HENT:     Head: Normocephalic and atraumatic.     Right Ear: Hearing normal.     Left Ear: Hearing normal.     Nose: Nose normal.  Eyes:     Conjunctiva/sclera: Conjunctivae normal.     Pupils: Pupils are equal, round, and reactive to light.  Cardiovascular:     Rate and Rhythm: Regular rhythm. Tachycardia present.     Heart sounds: S1 normal and S2 normal. No murmur heard. No friction rub. No gallop.   Pulmonary:     Effort: Pulmonary effort is normal. No respiratory distress.     Breath sounds: Normal breath sounds.  Chest:     Chest wall: No tenderness.  Abdominal:     General: Bowel sounds are normal.     Palpations: Abdomen is soft.     Tenderness: There is generalized abdominal tenderness. There is no guarding or rebound. Negative signs include Murphy's sign and McBurney's sign.     Hernia: No hernia is present.  Musculoskeletal:        General: Normal range of motion.     Cervical back: Normal range of motion and neck supple.  Skin:    General: Skin is warm and dry.     Findings: No rash.  Neurological:     Mental Status: She is alert and oriented to person, place, and time.     GCS: GCS eye subscore is 4. GCS verbal subscore is 5. GCS motor subscore is 6.     Cranial Nerves: No cranial nerve deficit.     Sensory: No sensory deficit.     Coordination: Coordination normal.  Psychiatric:        Speech: Speech normal.        Behavior: Behavior normal.        Thought Content: Thought content normal.     ED Results / Procedures / Treatments   Labs (all labs ordered are listed, but only abnormal results are displayed) Labs Reviewed  COMPREHENSIVE METABOLIC PANEL - Abnormal; Notable for the  following components:      Result Value   Sodium 132 (*)    Potassium 3.4 (*)    Chloride 95 (*)    CO2 13 (*)    Glucose, Bld 390 (*)    BUN 65 (*)    Creatinine, Ser 4.38 (*)    Total Protein 8.2 (*)    Albumin 3.2 (*)    AST 14 (*)    Total Bilirubin 1.3 (*)    GFR, Estimated 11 (*)    Anion gap 24 (*)    All other components within normal limits  CBC - Abnormal; Notable for the following components:   WBC 19.3 (*)    RBC 2.93 (*)    Hemoglobin 8.3 (*)    HCT 25.8 (*)    Platelets 570 (*)    All other components within normal limits  URINALYSIS, ROUTINE W REFLEX MICROSCOPIC - Abnormal; Notable for the following components:   Glucose, UA >=500 (*)    Hgb urine dipstick SMALL (*)    Ketones, ur 20 (*)    Protein, ur 100 (*)    Bacteria, UA RARE (*)    All other components within normal limits  BASIC METABOLIC PANEL - Abnormal; Notable for the following components:   Sodium 134 (*)    Chloride 96 (*)    CO2 13 (*)    Glucose, Bld 480 (*)  BUN 69 (*)    Creatinine, Ser 4.13 (*)    Calcium 8.7 (*)    GFR, Estimated 12 (*)    Anion gap 25 (*)    All other components within normal limits  BLOOD GAS, VENOUS - Abnormal; Notable for the following components:   pCO2, Ven 30.7 (*)    Bicarbonate 15.1 (*)    Acid-base deficit 9.8 (*)    All other components within normal limits  CBG MONITORING, ED - Abnormal; Notable for the following components:   Glucose-Capillary 459 (*)    All other components within normal limits  TROPONIN I (HIGH SENSITIVITY) - Abnormal; Notable for the following components:   Troponin I (High Sensitivity) 18 (*)    All other components within normal limits  RESP PANEL BY RT-PCR (FLU A&B, COVID) ARPGX2  MRSA PCR SCREENING  LIPASE, BLOOD  HCG, QUANTITATIVE, PREGNANCY  BETA-HYDROXYBUTYRIC ACID  BASIC METABOLIC PANEL  BASIC METABOLIC PANEL  BASIC METABOLIC PANEL  BASIC METABOLIC PANEL  BASIC METABOLIC PANEL  BETA-HYDROXYBUTYRIC ACID   BETA-HYDROXYBUTYRIC ACID  BETA-HYDROXYBUTYRIC ACID  HEMOGLOBIN A1C    EKG EKG Interpretation  Date/Time:  Sunday Jul 20 2020 23:05:51 EDT Ventricular Rate:  123 PR Interval:  143 QRS Duration: 85 QT Interval:  330 QTC Calculation: 472 R Axis:   -28 Text Interpretation: Sinus tachycardia Atrial premature complex Borderline left axis deviation 12 Lead; [place] Confirmed by Orpah Greek [phone]) on 07/21/2020 1:02:22 AM   Radiology DG Chest Port 1 View  Result Date: 07/21/2020 CLINICAL DATA:  Nausea vomiting abdominal pain EXAM: PORTABLE CHEST 1 VIEW COMPARISON:  05/25/2020 FINDINGS: The heart size and mediastinal contours are within normal limits. Both lungs are clear. The visualized skeletal structures are unremarkable. IMPRESSION: No active disease. Electronically Signed   By: Fidela Salisbury MD   On: 07/21/2020 01:41    Procedures Procedures   Medications Ordered in ED Medications  potassium chloride 10 mEq in 100 mL IVPB (10 mEq Intravenous New Bag/Given 07/21/20 0541)  Chlorhexidine Gluconate Cloth 2 % PADS 6 each (has no administration in time range)  atorvastatin (LIPITOR) tablet 10 mg (has no administration in time range)  hydrALAZINE (APRESOLINE) tablet 25 mg (has no administration in time range)  isosorbide mononitrate (IMDUR) 24 hr tablet 60 mg (has no administration in time range)  metoprolol succinate (TOPROL-XL) 24 hr tablet 25 mg (has no administration in time range)  ALPRAZolam (XANAX) tablet 1 mg (has no administration in time range)  DULoxetine (CYMBALTA) DR capsule 40 mg (has no administration in time range)  pantoprazole (PROTONIX) EC tablet 40 mg (has no administration in time range)  insulin regular, human (MYXREDLIN) 100 units/ 100 mL infusion (has no administration in time range)  lactated ringers infusion (has no administration in time range)  dextrose 5 % in lactated ringers infusion (has no administration in time range)  dextrose 50 %  solution 0-50 mL (has no administration in time range)  apixaban (ELIQUIS) tablet 5 mg (has no administration in time range)  sodium chloride 0.9 % bolus 1,000 mL (1,000 mLs Intravenous New Bag/Given 07/21/20 0115)  HYDROmorphone (DILAUDID) injection 2 mg (2 mg Intravenous Given 07/21/20 0109)  haloperidol lactate (HALDOL) injection 2 mg (2 mg Intravenous Given 07/21/20 0442)  lactated ringers bolus 2,712 mL (1,000 mLs Intravenous New Bag/Given 07/21/20 0523)    ED Course  I have reviewed the triage vital signs and the nursing notes.  Pertinent labs & imaging results that were available during my care of  the patient were reviewed by me and considered in my medical decision making (see chart for details).    MDM Rules/Calculators/A&P                          Patient presents to the emergency department for evaluation of nausea and vomiting with abdominal pain.  Reviewing her records reveals she has a history of chronic pain syndrome as well as recurrent visits for gastroparesis and abdominal pain similar to current presentation.  Patient treated with analgesia and antiemetics, continues to have severe pain and vomiting.  Additionally, work-up reveals moderate acute kidney injury.  She has a very slight elevation of troponin but this is lower than her normal, chronic elevation.  Blood sugars are somewhat elevated, but she does have a moderate anion gap concerning for diabetic ketoacidosis.  We will continue IV hydration, placed on glucose drip.  As for the abdominal pain, she has had multiple CT scans of her abdomen for similar presentations in the past.  I do not believe that she requires imaging at this time.  Will require hospitalization for further management.  CRITICAL CARE Performed by: Orpah Greek   Total critical care time: 30 minutes  Critical care time was exclusive of separately billable procedures and treating other patients.  Critical care was necessary to treat or prevent  imminent or life-threatening deterioration.  Critical care was time spent personally by me on the following activities: development of treatment plan with patient and/or surrogate as well as nursing, discussions with consultants, evaluation of patient's response to treatment, examination of patient, obtaining history from patient or surrogate, ordering and performing treatments and interventions, ordering and review of laboratory studies, ordering and review of radiographic studies, pulse oximetry and re-evaluation of patient's condition.   Final Clinical Impression(s) / ED Diagnoses Final diagnoses:  Diabetic ketoacidosis without coma associated with other specified diabetes mellitus [facility])    Rx / DC Orders ED Discharge Orders    None       Jax Abdelrahman, Gwenyth Allegra, MD 07/21/20 [phone]

## 2020-07-21 NOTE — Plan of Care (Signed)
  Problem: Health Behavior/Discharge Planning: Goal: Ability to manage health-related needs will improve Outcome: Not Progressing   Problem: Nutritional: Goal: Maintenance of adequate nutrition will improve Outcome: Not Progressing   

## 2020-07-21 NOTE — Progress Notes (Addendum)
  Brief same-day note  Patient is a 55 year old female with history of diabetes type 2, hypertension, gastroparesis, CKD stage IIIb who presents to the emergency room with complaint of nausea, vomiting, abdominal discomfort.  She was found to be in DKA and was started on DKA protocol. Patient seen and examined the bedside this morning.  She was still complaining of abdominal discomfort.  BMP this morning still shows elevated anion gap.  We will continue insulin drip.  Patient also has diabetic gastroparesis which could be contributing to her abdominal pain.  She says she cannot tolerate Reglan due to her history of tardive dyskinesia.  We will try morphine for pain control.  We will keep her n.p.o. Lab work also showed AKI on her CKD.  Most likely this is from dehydration from nausea, vomiting.  Continue IV fluids , monitor BMP. she is hypertensive.  Continue as needed medications for severe hypertension We will  continue current management

## 2020-07-21 NOTE — H&P (Signed)
History and Physical    Deborah [name] UTM:[id number] DOB: 08-Jul-1965 DOA: 07/20/2020  PCP: Cleophas Dunker, DO  Patient coming from: Home.  Chief Complaint: Nausea vomiting.  HPI: Deborah [name] is a 55 y.o. female with history of diabetes mellitus type 2, hypertension, gastroparesis, chronic kidney disease stage creatinine baseline around 2.1 presents to the ER with complaints of nausea vomiting with diffuse abdominal discomfort since yesterday morning.  Last bowel movement was yesterday.  Denies any chest pain or shortness of breath.  Patient states she has been compliant with her insulin Lantus.    ED Course: In the ER patient labs show anion gap of 24 with bicarb of 13 blood sugar of 390 creatinine worsened from 2.1 it is around 4.3.  Given the anion gap metabolic acidosis and elevated blood sugar patient was started on IV insulin infusion fluids for DKA.  Abdomen appears soft but mildly tender diffusely.  COVID test was negative.  Labs also show leukocytosis.  EKG shows sinus tachycardia.  COVID test was negative.  Review of Systems: As per HPI, rest all negative.   Past Medical History:  Diagnosis Date  . Acute back pain with sciatica, left   . Acute back pain with sciatica, right   . AKI (acute kidney injury) ([place])   . Anemia, unspecified   . Cancer ([place])   . Carcinoid tumor of duodenum   . Chronic pain   . Chronic systolic CHF (congestive heart failure) ([place])   . Diabetes mellitus   . Esophageal reflux   . Fibromyalgia   . Gastric ulcer   . Gastroparesis   . Gout   . Hyperlipidemia   . Hypertension   . Lumbosacral stenosis   . NICM (nonischemic cardiomyopathy) ([place])   . Obesity   . PAF (paroxysmal atrial fibrillation) ([place])   . Stroke ([place]) 02/2011  . Vitamin B12 deficiency anemia     Past Surgical History:  Procedure Laterality Date  . BIOPSY  07/27/2019   Procedure: BIOPSY;  Surgeon: Clarene Essex, MD;  Location: [facility];  Service:  Endoscopy;;  . BIOPSY  07/30/2019   Procedure: BIOPSY;  Surgeon: Otis Brace, MD;  Location: [facility];  Service: Gastroenterology;;  . CATARACT EXTRACTION  01/2014  . CHOLECYSTECTOMY    . COLONOSCOPY WITH PROPOFOL N/A 07/30/2019   Procedure: COLONOSCOPY WITH PROPOFOL;  Surgeon: Otis Brace, MD;  Location: [facility];  Service: Gastroenterology;  Laterality: N/A;  . ESOPHAGOGASTRODUODENOSCOPY N/A 07/27/2019   Procedure: ESOPHAGOGASTRODUODENOSCOPY (EGD);  Surgeon: Clarene Essex, MD;  Location: [facility];  Service: Endoscopy;  Laterality: N/A;  . ESOPHAGOGASTRODUODENOSCOPY (EGD) WITH PROPOFOL N/A 08/02/2019   Procedure: ESOPHAGOGASTRODUODENOSCOPY (EGD) WITH PROPOFOL;  Surgeon: Otis Brace, MD;  Location: [facility];  Service: Gastroenterology;  Laterality: N/A;  . HEMOSTASIS CLIP PLACEMENT  08/02/2019   Procedure: HEMOSTASIS CLIP PLACEMENT;  Surgeon: Otis Brace, MD;  Location: [facility];  Service: Gastroenterology;;  . POLYPECTOMY  07/30/2019   Procedure: POLYPECTOMY;  Surgeon: Otis Brace, MD;  Location: [facility];  Service: Gastroenterology;;  . POLYPECTOMY  08/02/2019   Procedure: POLYPECTOMY;  Surgeon: Otis Brace, MD;  Location: [facility];  Service: Gastroenterology;;     reports that she has never smoked. She has never used smokeless tobacco. She reports that she does not drink alcohol and does not use drugs.  Allergies  Allergen Reactions  . Diazepam [name]  . Isovue [Iopamidol] Anaphylaxis    11/28/17 Patient had seizure like activity and then 1 min code  after 100 cc of isovue 300. Possible contrast allergy vs vasovagal episode  . Lisinopril Anaphylaxis    Tongue and mouth swelling  . Metoclopramide Other (See Comments)    Tardive dyskinesia  . Penicillins Palpitations    Has patient had a PCN reaction causing immediate rash, facial/tongue/throat swelling, SOB or lightheadedness with hypotension: Yes, heart races Has  patient had a PCN reaction causing severe rash involving mucus membranes or skin necrosis: No Has patient had a PCN reaction that required hospitalization: Yes  Has patient had a PCN reaction occurring within the last 10 years: No   . Tolmetin Nausea Only and Other (See Comments)    ULCER  . Aspirin Other (See Comments)    Irritates stomach ulcer   . Nsaids Other (See Comments)    ULCER  . Acetaminophen Nausea Only and Other (See Comments)    Irritates stomach ulcer; Abdominal pain  . Tramadol Nausea And Vomiting    Family History  Problem Relation Age of Onset  . Diabetes Mother   . Diabetes Father   . Heart disease Father   . Diabetes Sister   . Congestive Heart Failure Sister 92  . Diabetes Brother     Prior to Admission medications   Medication Sig Start Date End Date Taking? Authorizing Provider  albuterol (PROVENTIL) (2.5 MG/3ML) 0.083% nebulizer solution Take 3 mLs (2.5 mg total) by nebulization every 6 (six) hours as needed for wheezing or shortness of breath. 04/06/19   Scot Jun, FNP  allopurinol (ZYLOPRIM) 100 MG tablet Take 100 mg by mouth daily.    [provider]  ALPRAZolam Duanne Moron) 1 MG tablet Take 1 tablet (1 mg total) by mouth 2 (two) times daily as needed for anxiety. 09/03/19   Lockamy, Christia Reading, DO  atorvastatin (LIPITOR) 10 MG tablet TAKE ONE TABLET BY MOUTH ONCE DAILY (AM) Patient taking differently: Take 10 mg by mouth daily. 11/14/19   Meccariello, Bernita Raisin, DO  blood glucose meter kit and supplies KIT Dispense based on patient and insurance preference. Use up to four times daily as directed. (FOR ICD-9 250.00, 250.01). Patient taking differently: Inject 1 each into the skin See admin instructions. Dispense based on patient and insurance preference. Use up to four times daily as directed. (FOR ICD-9 250.00, 250.01). 12/13/18   Lockamy, Christia Reading, DO  DULoxetine HCl 40 MG CPEP TAKE ONE CAPSULE BY MOUTH TWICE DAILY Patient taking differently: Take 40  mg by mouth in the morning and at bedtime. 05/06/20   Meccariello, Bernita Raisin, DO  EASY COMFORT PEN NEEDLES 31G X 5 MM MISC USE 3 TIMES A DAY FOR INSULIN ADMINISTRATION Patient taking differently: 1 each by Other route 3 (three) times daily. 11/14/19   Meccariello, Mel Almond J, DO  ELIQUIS 5 MG TABS tablet TAKE 1 TABLET BY MOUTH 2 (TWO) TIMES DAILY. Patient taking differently: Take 5 mg by mouth 2 (two) times daily. 12/20/19   Meccariello, Bernita Raisin, DO  FARXIGA 10 MG TABS tablet Take 10 mg by mouth daily. 10/02/19   [provider]  ferrous sulfate 325 (65 FE) MG tablet Take 1 tablet (325 mg total) by mouth daily with breakfast. Patient not taking: Reported on 03/28/2020 09/01/19 01/23/20  Alma Friendly, MD  fluconazole (DIFLUCAN) 50 MG tablet Take 1 tablet (50 mg total) by mouth daily. 05/05/20   Raulkar, Clide Deutscher, MD  fluticasone (FLONASE) 50 MCG/ACT nasal spray Place 2 sprays into both nostrils daily as needed for allergies or rhinitis. 12/19/18   Rai,  Ripudeep K, MD  hydrALAZINE (APRESOLINE) 25 MG tablet Take 1 tablet (25 mg total) by mouth 3 (three) times daily. 05/07/20   Meccariello, Bernita Raisin, DO  HYDROmorphone (DILAUDID) 2 MG tablet Take 1 tablet (2 mg total) by mouth daily as needed for severe pain. 07/01/20   Raulkar, Clide Deutscher, MD  hydrOXYzine (ATARAX/VISTARIL) 10 MG tablet Take 1 tablet (10 mg total) by mouth 3 (three) times daily as needed. 11/15/19   Meccariello, Bernita Raisin, DO  isosorbide mononitrate (IMDUR) 60 MG 24 hr tablet TAKE 1 TABLET (60 MG TOTAL) BY MOUTH DAILY. 07/01/20   Meccariello, Bernita Raisin, DO  LANTUS SOLOSTAR 100 UNIT/ML Solostar Pen Inject 32 Units into the skin at bedtime. Patient taking differently: Inject 50 Units into the skin at bedtime. 01/28/20 02/29/20  Raiford Noble Latif, DO  melatonin 3 MG TABS tablet Take 2 tablets (6 mg total) by mouth at bedtime. 02/04/20   Meccariello, Bernita Raisin, DO  metoCLOPramide (REGLAN) 10 MG tablet Take 10 mg by mouth 4 (four) times  daily.    [provider]  metoprolol succinate (TOPROL-XL) 25 MG 24 hr tablet Take 1 tablet (25 mg total) by mouth daily. 02/04/20   Meccariello, Bernita Raisin, DO  nitroGLYCERIN (NITROSTAT) 0.4 MG SL tablet Place 1 tablet (0.4 mg total) under the tongue every 5 (five) minutes as needed for chest pain. Patient taking differently: Place 0.4 mg under the tongue every 5 (five) minutes as needed for chest pain (max 3 doses). 12/13/18   Lockamy, Timothy, DO  NOVOLOG FLEXPEN 100 UNIT/ML FlexPen Inject 30 Units into the skin in the morning, at noon, in the evening, and at bedtime. 02/04/20   Meccariello, Bernita Raisin, DO  ondansetron (ZOFRAN) 4 MG tablet Take 1 tablet (4 mg total) by mouth every 6 (six) hours as needed for nausea. 07/01/20   Raulkar, Clide Deutscher, MD  pantoprazole (PROTONIX) 40 MG tablet TAKE ONE TABLET BY MOUTH TWICE DAILY Patient taking differently: Take 40 mg by mouth 2 (two) times daily. 09/04/19   [name], DO  potassium chloride (KLOR-CON) 10 MEQ tablet Take 1 tablet (10 mEq total) by mouth daily for 5 days. 05/25/20 05/30/20  Eustaquio Maize, PA-C  torsemide (DEMADEX) 20 MG tablet Take 2 tablets (40 mg total) by mouth daily. 07/01/20   Meccariello, Bernita Raisin, DO    Physical Exam: Constitutional: Moderately built and nourished. Vitals:   07/20/20 2256 07/21/20 0037 07/21/20 0100 07/21/20 0220  BP: (!) 144/86 (!) 161/101 (!) 141/92 (!) 178/102  Pulse: (!) 125 (!) 113 (!) 116 (!) 121  Resp: 20 (!) 21 (!) 32 18  Temp: 98.4 F (36.9 C)     TempSrc: Oral     SpO2: 98% 100% 100% 100%  Weight: 135.6 kg     Height: '5\' 6"'  (1.676 m)      Eyes: Anicteric no pallor. ENMT: No discharge from the ears eyes nose and mouth. Neck: No mass felt.  No neck rigidity. Respiratory: No rhonchi or crepitations. Cardiovascular: S1-S2 heard. Abdomen: Soft diffusely tender no guarding or rigidity. Musculoskeletal: No edema. Skin: No rash. Neurologic: Alert awake oriented to time place and person.   Moves all extremities. Psychiatric: Appears normal.  Normal affect.   Labs on Admission: I have personally reviewed following labs and imaging studies  CBC: Recent Labs  Lab 07/20/20 0100  WBC 19.3*  HGB 8.3*  HCT 25.8*  MCV 88.1  PLT 102*   Basic Metabolic Panel: Recent Labs  Lab 07/20/20 0100 07/21/20  0455  NA 132* 134*  K 3.4* 3.9  CL 95* 96*  CO2 13* 13*  GLUCOSE 390* 480*  BUN 65* 69*  CREATININE 4.38* 4.13*  CALCIUM 9.0 8.7*   GFR: Estimated Creatinine Clearance: 21.6 mL/min (A) (by C-G formula based on SCr of 4.13 mg/dL (H)). Liver Function Tests: Recent Labs  Lab 07/20/20 0100  AST 14*  ALT 8  ALKPHOS 99  BILITOT 1.3*  PROT 8.2*  ALBUMIN 3.2*   Recent Labs  Lab 07/20/20 0100  LIPASE 24   No results for input(s): AMMONIA in the last 168 hours. Coagulation Profile: No results for input(s): INR, PROTIME in the last 168 hours. Cardiac Enzymes: No results for input(s): CKTOTAL, CKMB, CKMBINDEX, TROPONINI in the last 168 hours. BNP (last 3 results) No results for input(s): PROBNP in the last 8760 hours. HbA1C: No results for input(s): HGBA1C in the last 72 hours. CBG: Recent Labs  Lab 07/21/20 0528  GLUCAP 459*   Lipid Profile: No results for input(s): CHOL, HDL, LDLCALC, TRIG, CHOLHDL, LDLDIRECT in the last 72 hours. Thyroid Function Tests: No results for input(s): TSH, T4TOTAL, FREET4, T3FREE, THYROIDAB in the last 72 hours. Anemia Panel: No results for input(s): VITAMINB12, FOLATE, FERRITIN, TIBC, IRON, RETICCTPCT in the last 72 hours. Urine analysis:    Component Value Date/Time   COLORURINE YELLOW 07/21/2020 0424   APPEARANCEUR CLEAR 07/21/2020 0424   LABSPEC 1.007 07/21/2020 0424   PHURINE 5.0 07/21/2020 0424   GLUCOSEU >=500 (A) 07/21/2020 0424   HGBUR SMALL (A) 07/21/2020 0424   BILIRUBINUR NEGATIVE 07/21/2020 0424   KETONESUR 20 (A) 07/21/2020 0424   PROTEINUR 100 (A) 07/21/2020 0424   UROBILINOGEN 0.2 10/02/2013 2108    NITRITE NEGATIVE 07/21/2020 0424   LEUKOCYTESUR NEGATIVE 07/21/2020 0424   Sepsis Labs: '@LABRCNTIP' (procalcitonin:4,lacticidven:4) ) Recent Results (from the past 240 hour(s))  Resp Panel by RT-PCR (Flu A&B, Covid) Urine, Clean Catch     Status: None   Collection Time: 07/21/20  4:55 AM   Specimen: Urine, Clean Catch; Nasopharyngeal(NP) swabs in vial transport medium  Result Value Ref Range Status   SARS Coronavirus 2 by RT PCR NEGATIVE NEGATIVE Final    Comment: (NOTE) SARS-CoV-2 target nucleic acids are NOT DETECTED.  The SARS-CoV-2 RNA is generally detectable in upper respiratory specimens during the acute phase of infection. The lowest concentration of SARS-CoV-2 viral copies this assay can detect is 138 copies/mL. A negative result does not preclude SARS-Cov-2 infection and should not be used as the sole basis for treatment or other patient management decisions. A negative result may occur with  improper specimen collection/handling, submission of specimen other than nasopharyngeal swab, presence of viral mutation(s) within the areas targeted by this assay, and inadequate number of viral copies(<138 copies/mL). A negative result must be combined with clinical observations, patient history, and epidemiological information. The expected result is Negative.  Fact Sheet for Patients:  EntrepreneurPulse.com.au  Fact Sheet for Healthcare Providers:  IncredibleEmployment.be  This test is no t yet approved or cleared by the [place] FDA and  has been authorized for detection and/or diagnosis of SARS-CoV-2 by FDA under an Emergency Use Authorization (EUA). This EUA will remain  in effect (meaning this test can be used) for the duration of the COVID-19 declaration under Section 564(b)(1) of the Act, 21 U.S.C.section 360bbb-3(b)(1), unless the authorization is terminated  or revoked sooner.       Influenza A by PCR NEGATIVE NEGATIVE Final    Influenza B by PCR NEGATIVE NEGATIVE  Final    Comment: (NOTE) The Xpert Xpress SARS-CoV-2/FLU/RSV plus assay is intended as an aid in the diagnosis of influenza from Nasopharyngeal swab specimens and should not be used as a sole basis for treatment. Nasal washings and aspirates are unacceptable for Xpert Xpress SARS-CoV-2/FLU/RSV testing.  Fact Sheet for Patients: EntrepreneurPulse.com.au  Fact Sheet for Healthcare Providers: IncredibleEmployment.be  This test is not yet approved or cleared by the [place] FDA and has been authorized for detection and/or diagnosis of SARS-CoV-2 by FDA under an Emergency Use Authorization (EUA). This EUA will remain in effect (meaning this test can be used) for the duration of the COVID-19 declaration under Section 564(b)(1) of the Act, 21 U.S.C. section 360bbb-3(b)(1), unless the authorization is terminated or revoked.  Performed at [facility], [place] [address] [place], [place] [address]      Radiological Exams on Admission: DG Chest Port 1 View  Result Date: 07/21/2020 CLINICAL DATA:  Nausea vomiting abdominal pain EXAM: PORTABLE CHEST 1 VIEW COMPARISON:  05/25/2020 FINDINGS: The heart size and mediastinal contours are within normal limits. Both lungs are clear. The visualized skeletal structures are unremarkable. IMPRESSION: No active disease. Electronically Signed   By: Fidela Salisbury MD   On: 07/21/2020 01:41    EKG: Independently reviewed.  Sinus tachycardia.  Assessment/Plan Principal Problem:   DKA (diabetic ketoacidosis) ([place]) Active Problems:   Essential hypertension, benign   CKD (chronic kidney disease), stage II   ARF (acute renal failure) ([place])    1. Diabetic ketoacidosis -patient states he has been compliant with her medications.  Will check hemoglobin A1c.  Presently on IV fluids and insulin infusion.  DKA protocol.  Follow metabolic panel closely. 2. Acute on  chronic kidney disease stage IV likely precipitated by nausea vomiting.  Receiving fluids.  Follow metabolic panel intake and output.  If creatinine does not improve and patient presents for nausea vomiting may consult nephrology for concerning for an uremic syndrome. 3. Nausea vomiting with abdominal discomfort likely from gastroparesis.  Antiemetics.  IV fluids.  Control diabetes. 4. Hypertension we will continue home medications including metoprolol Imdur and hydralazine. 5. A. fib on Eliquis and metoprolol.  Discussed with pharmacy about Eliquis. 6. Anemia likely from renal disease follow CBC.   DVT prophylaxis: Eliquis. Code Status: Full code. Family Communication: Discussed with patient. Disposition Plan: Home Consults called: None. Admission status: Observation.   Rise Patience MD [facility] Pager [phone].  If 7PM-7AM, please contact night-coverage [email or web address] Password [facility]  07/21/2020, 6:41 AM

## 2020-07-21 NOTE — Progress Notes (Signed)
Inpatient Diabetes Program Recommendations  AACE/ADA: New Consensus Statement on Inpatient Glycemic Control (2015)  Target Ranges:  Prepandial:   less than 140 mg/dL      Peak postprandial:   less than 180 mg/dL (1-2 hours)      Critically ill patients:  140 - 180 mg/dL   Lab Results  Component Value Date   GLUCAP 216 (H) 07/21/2020   HGBA1C 10.5 (H) 07/21/2020    Review of Glycemic Control  Diabetes history: DM2 Outpatient Diabetes medications: Lantus 50 units QHS, Farxiga 10 units QD Current orders for Inpatient glycemic control: IV insulin per EndoTool for DKA  HgbA1C - 10.5% Well-known to Inpt Diabetes Team AG - 14, CO2 19, Blood sugar 285 Gtt rate - 10.  Inpatient Diabetes Program Recommendations:     When transitioning to SQ insulin, give Lantus 20 units 2 hours prior to discontinuation of drip. Lantus 20 units BID Novolog 0-15 units Q4 x 12H, then TID with meals and 0-5 HS. Add Novolog 8 units TID with meals if eating > 50%.   Will follow.  Thank you. Lorenda Peck, RD, LDN, CDE Inpatient Diabetes Coordinator [phone]

## 2020-07-22 ENCOUNTER — Observation Stay (HOSPITAL_COMMUNITY): Payer: Medicare Other

## 2020-07-22 DIAGNOSIS — N184 Chronic kidney disease, stage 4 (severe): Secondary | ICD-10-CM | POA: Diagnosis present

## 2020-07-22 DIAGNOSIS — Z794 Long term (current) use of insulin: Secondary | ICD-10-CM | POA: Diagnosis not present

## 2020-07-22 DIAGNOSIS — Z20822 Contact with and (suspected) exposure to covid-19: Secondary | ICD-10-CM | POA: Diagnosis present

## 2020-07-22 DIAGNOSIS — E0810 Diabetes mellitus due to underlying condition with ketoacidosis without coma: Secondary | ICD-10-CM | POA: Diagnosis not present

## 2020-07-22 DIAGNOSIS — E1122 Type 2 diabetes mellitus with diabetic chronic kidney disease: Secondary | ICD-10-CM | POA: Diagnosis present

## 2020-07-22 DIAGNOSIS — E785 Hyperlipidemia, unspecified: Secondary | ICD-10-CM | POA: Diagnosis present

## 2020-07-22 DIAGNOSIS — Z79899 Other long term (current) drug therapy: Secondary | ICD-10-CM | POA: Diagnosis not present

## 2020-07-22 DIAGNOSIS — E1110 Type 2 diabetes mellitus with ketoacidosis without coma: Secondary | ICD-10-CM | POA: Diagnosis present

## 2020-07-22 DIAGNOSIS — K920 Hematemesis: Secondary | ICD-10-CM | POA: Diagnosis present

## 2020-07-22 DIAGNOSIS — D62 Acute posthemorrhagic anemia: Secondary | ICD-10-CM | POA: Diagnosis present

## 2020-07-22 DIAGNOSIS — I130 Hypertensive heart and chronic kidney disease with heart failure and stage 1 through stage 4 chronic kidney disease, or unspecified chronic kidney disease: Secondary | ICD-10-CM | POA: Diagnosis present

## 2020-07-22 DIAGNOSIS — I5022 Chronic systolic (congestive) heart failure: Secondary | ICD-10-CM | POA: Diagnosis present

## 2020-07-22 DIAGNOSIS — N179 Acute kidney failure, unspecified: Secondary | ICD-10-CM | POA: Diagnosis present

## 2020-07-22 DIAGNOSIS — M797 Fibromyalgia: Secondary | ICD-10-CM | POA: Diagnosis present

## 2020-07-22 DIAGNOSIS — D72828 Other elevated white blood cell count: Secondary | ICD-10-CM | POA: Diagnosis present

## 2020-07-22 DIAGNOSIS — I480 Paroxysmal atrial fibrillation: Secondary | ICD-10-CM | POA: Diagnosis present

## 2020-07-22 DIAGNOSIS — Z833 Family history of diabetes mellitus: Secondary | ICD-10-CM | POA: Diagnosis not present

## 2020-07-22 DIAGNOSIS — K3184 Gastroparesis: Secondary | ICD-10-CM | POA: Diagnosis present

## 2020-07-22 DIAGNOSIS — Z6841 Body Mass Index (BMI) 40.0 and over, adult: Secondary | ICD-10-CM | POA: Diagnosis not present

## 2020-07-22 DIAGNOSIS — E1143 Type 2 diabetes mellitus with diabetic autonomic (poly)neuropathy: Secondary | ICD-10-CM | POA: Diagnosis present

## 2020-07-22 DIAGNOSIS — I428 Other cardiomyopathies: Secondary | ICD-10-CM | POA: Diagnosis present

## 2020-07-22 DIAGNOSIS — Z7901 Long term (current) use of anticoagulants: Secondary | ICD-10-CM | POA: Diagnosis not present

## 2020-07-22 DIAGNOSIS — Z8673 Personal history of transient ischemic attack (TIA), and cerebral infarction without residual deficits: Secondary | ICD-10-CM | POA: Diagnosis not present

## 2020-07-22 DIAGNOSIS — K2101 Gastro-esophageal reflux disease with esophagitis, with bleeding: Secondary | ICD-10-CM | POA: Diagnosis present

## 2020-07-22 DIAGNOSIS — Z85060 Personal history of malignant carcinoid tumor of small intestine: Secondary | ICD-10-CM | POA: Diagnosis not present

## 2020-07-22 LAB — CBC WITH DIFFERENTIAL/PLATELET
Abs Immature Granulocytes: 0.08 10*3/uL — ABNORMAL HIGH (ref 0.00–0.07)
Basophils Absolute: 0.1 10*3/uL (ref 0.0–0.1)
Basophils Relative: 0 %
Eosinophils Absolute: 0 10*3/uL (ref 0.0–0.5)
Eosinophils Relative: 0 %
HCT: 25.1 % — ABNORMAL LOW (ref 36.0–46.0)
Hemoglobin: 7.8 g/dL — ABNORMAL LOW (ref 12.0–15.0)
Immature Granulocytes: 1 %
Lymphocytes Relative: 14 %
Lymphs Abs: 2.2 10*3/uL (ref 0.7–4.0)
MCH: 28.1 pg (ref 26.0–34.0)
MCHC: 31.1 g/dL (ref 30.0–36.0)
MCV: 90.3 fL (ref 80.0–100.0)
Monocytes Absolute: 1 10*3/uL (ref 0.1–1.0)
Monocytes Relative: 6 %
Neutro Abs: 12.7 10*3/uL — ABNORMAL HIGH (ref 1.7–7.7)
Neutrophils Relative %: 79 %
Platelets: 492 10*3/uL — ABNORMAL HIGH (ref 150–400)
RBC: 2.78 MIL/uL — ABNORMAL LOW (ref 3.87–5.11)
RDW: 14.4 % (ref 11.5–15.5)
WBC: 16.1 10*3/uL — ABNORMAL HIGH (ref 4.0–10.5)
nRBC: 0 % (ref 0.0–0.2)

## 2020-07-22 LAB — BASIC METABOLIC PANEL
Anion gap: 12 (ref 5–15)
BUN: 51 mg/dL — ABNORMAL HIGH (ref 6–20)
CO2: 23 mmol/L (ref 22–32)
Calcium: 8.5 mg/dL — ABNORMAL LOW (ref 8.9–10.3)
Chloride: 102 mmol/L (ref 98–111)
Creatinine, Ser: 2.74 mg/dL — ABNORMAL HIGH (ref 0.44–1.00)
GFR, Estimated: 20 mL/min — ABNORMAL LOW (ref >60)
Glucose, Bld: 338 mg/dL — ABNORMAL HIGH (ref 70–99)
Potassium: 3.5 mmol/L (ref 3.5–5.1)
Sodium: 137 mmol/L (ref 135–145)

## 2020-07-22 LAB — GLUCOSE, CAPILLARY
Glucose-Capillary: 354 mg/dL — ABNORMAL HIGH (ref 70–99)
Glucose-Capillary: 339 mg/dL — ABNORMAL HIGH (ref 70–99)
Glucose-Capillary: 234 mg/dL — ABNORMAL HIGH (ref 70–99)
Glucose-Capillary: 274 mg/dL — ABNORMAL HIGH (ref 70–99)
Glucose-Capillary: 247 mg/dL — ABNORMAL HIGH (ref 70–99)

## 2020-07-22 LAB — LACTIC ACID, PLASMA: Lactic Acid, Venous: 1.6 mmol/L (ref 0.5–1.9)

## 2020-07-22 MED ORDER — METOPROLOL SUCCINATE ER 25 MG PO TB24
50.0000 mg | ORAL_TABLET | Freq: Every day | ORAL | Status: DC
  Administered 2020-07-22: 50 mg via ORAL

## 2020-07-22 MED ORDER — HYDRALAZINE HCL 50 MG PO TABS
50.0000 mg | ORAL_TABLET | Freq: Three times a day (TID) | ORAL | Status: DC
  Administered 2020-07-22 (×3): 50 mg via ORAL
  Filled 2020-07-22 (×3): qty 1

## 2020-07-22 MED ORDER — SENNA 8.6 MG PO TABS
1.0000 | ORAL_TABLET | Freq: Every day | ORAL | Status: DC
  Administered 2020-07-23: 8.6 mg via ORAL
  Filled 2020-07-22 (×4): qty 1

## 2020-07-22 MED ORDER — LACTATED RINGERS IV SOLN: INTRAVENOUS | Status: AC

## 2020-07-22 MED ORDER — HYDROMORPHONE HCL 1 MG/ML IJ SOLN
0.5000 mg | INTRAMUSCULAR | Status: DC | PRN
  Administered 2020-07-22 – 2020-07-23 (×4): 0.5 mg via INTRAVENOUS
  Filled 2020-07-22 (×4): qty 1

## 2020-07-22 MED ORDER — HYDROMORPHONE HCL 1 MG/ML IJ SOLN
0.5000 mg | Freq: Once | INTRAMUSCULAR | Status: AC
  Administered 2020-07-22: 0.5 mg via INTRAVENOUS
  Filled 2020-07-22: qty 1

## 2020-07-22 NOTE — TOC Initial Note (Signed)
Transition of Care [facility]) - Initial/Assessment Note    Patient Details  Name: Deborah [name] MRN: [id number] Date of Birth: 06-27-1965  Transition of Care [facility]) CM/SW Contact:    Leeroy Cha, RN Phone Number: 07/22/2020, 8:34 AM  Clinical Narrative:                 55 y.o. female with history of diabetes mellitus type 2, hypertension, gastroparesis, chronic kidney disease stage creatinine baseline around 2.1 presents to the ER with complaints of nausea vomiting with diffuse abdominal discomfort since yesterday morning.  Last bowel movement was yesterday.  Denies any chest pain or shortness of breath.  Patient states she has been compliant with her insulin Lantus.    ED Course: In the ER patient labs show anion gap of 24 with bicarb of 13 blood sugar of 390 creatinine worsened from 2.1 it is around 4.3.  Given the anion gap metabolic acidosis and elevated blood sugar patient was started on IV insulin infusion fluids for DKA.  Abdomen appears soft but mildly tender diffusely.  COVID test was negative.  Labs also show leukocytosis.  EKG shows sinus tachycardia.  COVID test was negative. PLAN: to return to home with self care will follow for progression and toc needs Expected Discharge Plan: Home/Self Care Barriers to Discharge: Continued Medical Work up   Patient Goals and CMS Choice Patient states their goals for this hospitalization and ongoing recovery are:: to go home CMS Medicare.gov Compare Post Acute Care list provided to:: Patient    Expected Discharge Plan and Services Expected Discharge Plan: Home/Self Care   Discharge Planning Services: CM Consult   Living arrangements for the past 2 months: Single Family Home                                      Prior Living Arrangements/Services Living arrangements for the past 2 months: Single Family Home Lives with:: Self Patient language and need for interpreter reviewed:: Yes Do you feel safe going back to the  place where you live?: Yes      Need for Family Participation in Patient Care: Yes (Comment) Care giver support system in place?: Yes (comment)   Criminal Activity/Legal Involvement Pertinent to Current Situation/Hospitalization: No - Comment as needed  Activities of Daily Living Home Assistive Devices/Equipment: Bedside commode/3-in-1,CBG Meter,Nebulizer,Oxygen,Walker (specify type),Wheelchair,Shower chair with back (front wheeled walker) ADL Screening (condition at time of admission) Patient's cognitive ability adequate to safely complete daily activities?: Yes Is the patient deaf or have difficulty hearing?: No Does the patient have difficulty seeing, even when wearing glasses/contacts?: No Does the patient have difficulty concentrating, remembering, or making decisions?: No Patient able to express need for assistance with ADLs?: Yes Does the patient have difficulty dressing or bathing?: Yes Independently performs ADLs?: No Communication: Independent Dressing (OT): Needs assistance Is this a change from baseline?: Pre-admission baseline Grooming: Independent Feeding: Independent Bathing: Needs assistance Is this a change from baseline?: Pre-admission baseline Toileting: Needs assistance Is this a change from baseline?: Pre-admission baseline In/Out Bed: Needs assistance Is this a change from baseline?: Pre-admission baseline Walks in Home: Needs assistance Is this a change from baseline?: Pre-admission baseline Does the patient have difficulty walking or climbing stairs?: Yes (secondary to weakness) Weakness of Legs: Right Weakness of Arms/Hands: Both  Permission Sought/Granted  Emotional Assessment Appearance:: Appears stated age Attitude/Demeanor/Rapport: Engaged Affect (typically observed): Calm Orientation: : Oriented to Self,Oriented to Place,Oriented to  Time,Oriented to Situation Alcohol / Substance Use: Not Applicable    Admission diagnosis:   DKA (diabetic ketoacidosis) ([place]) [E11.10] Nausea & vomiting [R11.2] Diabetic ketoacidosis without coma associated with other specified diabetes mellitus ([place]) [E13.10] Patient Active Problem List   Diagnosis Date Noted  . DKA (diabetic ketoacidosis) ([place]) 07/21/2020  . ARF (acute renal failure) ([place]) 07/21/2020  . Vaginal yeast infection 01/04/2020  . AKI (acute kidney injury) ([place]) 11/27/2019  . Chronic pain 09/04/2019  . Malignant carcinoid tumor of duodenum ([place])   . Nonalcoholic fatty liver disease 06/05/2019  . Chronic diarrhea   . COPD exacerbation ([place]) 05/08/2019  . Acute on chronic HFrEF (heart failure with reduced ejection fraction) ([place])   . Restrictive lung disease secondary to obesity   . History of gastric ulcer   . Uncontrolled type 2 diabetes mellitus with hyperglycemia ([place])   . Fibroid uterus 02/23/2019  . Chronic systolic CHF (congestive heart failure) ([place]) 12/19/2018  . Intractable nausea and vomiting 12/17/2018  . Hypoxia 12/17/2018  . Abdominal pain 07/17/2018  . Cardiac arrest ([place]) 11/29/2017  . Leukocytosis 11/29/2017  . Anxiety 11/29/2017  . Stroke (cerebrum) ([place]) 03/19/2017  . GERD (gastroesophageal reflux disease) 03/19/2017  . Depression 03/19/2017  . Morbid obesity ([place])   . Normocytic normochromic anemia 08/16/2016  . Diabetic gastroparesis ([place]) 06/05/2016  . Gout 06/05/2016  . Nonspecific chest pain 09/26/2015  . Hypokalemia 09/26/2015  . Hypomagnesemia 09/26/2015  . Nausea and vomiting 08/20/2015  . DKA (diabetic ketoacidoses) 05/25/2015  . Uncontrolled type 2 diabetes mellitus with diabetic neuropathy, with long-term current use of insulin ([place]) 05/25/2015  . Type 2 diabetes mellitus with hyperglycemia, with long-term current use of insulin ([place]) 05/25/2015  . CKD (chronic kidney disease), stage II 05/25/2015  . Essential hypertension, benign 09/28/2013   PCP:  Cleophas Dunker, DO Pharmacy:   [place], [place] - [address] [place] [address] Phone: [phone] Fax: [phone]     Social Determinants of Health (SDOH) Interventions    Readmission Risk Interventions Readmission Risk Prevention Plan 07/02/2019 06/25/2019 01/29/2019  Transportation Screening Complete Complete Complete  PCP or Specialist Appt within 3-5 Days - - Complete  Not Complete comments - - -  [place]RI or [place] - - Complete  Social Work Consult for [place] Planning/Counseling - - Complete  Palliative Care Screening - - Not Applicable  Medication Review Press photographer) Complete Complete Complete  PCP or Specialist appointment within 3-5 days of discharge - Complete -  [place] or [place] Complete - -  SW Recovery Care/Counseling Consult Complete Complete -  Palliative Care Screening Not Applicable Not Applicable -  [place] Not Applicable Not Applicable -  Some recent data might be hidden

## 2020-07-22 NOTE — Progress Notes (Signed)
PROGRESS NOTE    Deborah [name]  SHF:[id number] DOB: 1965/07/03 DOA: 07/20/2020 PCP: Cleophas Dunker, DO   Chief Complain: Nausea, vomiting, abdominal pain  Brief Narrative: Patient is a 55 year old female with history of diabetes type 2, hypertension, gastroparesis, CKD stage IIIb who presents to the emergency room with complaint of nausea, vomiting, abdominal discomfort.  She was found to be in DKA and was started on DKA protocol.  Lab work also showed AKI on her CKD on presentation.  Currently on sliding scale and Lantus.  Diabetic coordinator following.  Hemoglobin A1c of more than 10.  Hospital course remarkable for persistent abdominal pain.  Assessment & Plan:   Principal Problem:   DKA (diabetic ketoacidosis) ([place]) Active Problems:   Essential hypertension, benign   CKD (chronic kidney disease), stage II   ARF (acute renal failure) ([place])   DKA: Patient states she is compliant with her medications.  Hemoglobin A1c of more than 10.  Diabetic coordinator  following.  Started on DKA protocol.  Gap has closed, currently she is on Lantus and sliding scale.  She is still hyperglycemic this morning.  Abdominal pain/diabetic gastroparesis: Presented with nausea, vomiting, abdominal pain.  Cannot tolerate Reglan due to history of tardive dyskinesia.  Continue Zofran, Phenergan.  Continue IV fluids.  Patient will be instructed to take small-volume frequent nonfatty meals. She has history of malignant carcinoid tumor of duodenum, status post resection.  She has persistent abdominal discomfort.  We will check abdominal CT/pelvis without contrast  AKI on CKD stage IV: Creatinine in the range of 4 on presentation most likely precipitated due to dehydration from nausea/vomiting.  Continue IV fluids.Her baseline creatinine is around 2.  Kidney function improving  with IV fluids.  She needs nephrology follow-up as an outpatient.  Hypertension: Remains mildly hypertensive.  Takes  hydralazine, Imdur, metoprolol at home.  Increased the dose of hydralazine.  Paroxysmal A. fib:   On Eliquis for anticoagulation.  On metoprolol for rate control.  Monitor in telemetry  Normocytic anemia: Likely chronic and associated with chronic kidney disease.  Currently hemoglobin stable in the range of 8-9,at baseline.  Monitor  Leukocytosis: Most likely reactive.Afebrile.  UA was not impressive for UTI.  Does not have any respiratory symptoms/low suspicion for pneumonia.  Continue to monitor           DVT prophylaxis:Eliquis Code Status: Full Family Communication: None at bedside Status is: Observation    Dispo: The patient is from: Home              Anticipated d/c is to: Home              Patient currently is not medically stable to d/c.   Difficult to place patient No    Consultants: None  Procedures:None  Antimicrobials:  Anti-infectives (From admission, onward)   None      Subjective:  Patient seen and examined at the bedside this morning.  EKG monitor showed A. fib with heart rate in the range of 100-110.  She still complains of abdominal discomfort and states she not feeling well.  Nausea and vomiting are better.   Objective: Vitals:   07/22/20 0300 07/22/20 0400 07/22/20 0545 07/22/20 0600  BP: (!) 166/82 (!) 174/93 (!) 166/64 (!) 167/71  Pulse: (!) 108 (!) 113 (!) 104 (!) 108  Resp: (!) 21 19 18  (!) 24  Temp:  98 F (36.7 C)    TempSrc:  Axillary    SpO2: 97% 97% 97% 96%  Weight:      Height:        Intake/Output Summary (Last 24 hours) at 07/22/2020 0733 Last data filed at 07/22/2020 0641 Gross per 24 hour  Intake 4331.92 ml  Output --  Net 4331.92 ml   Filed Weights   07/20/20 2256  Weight: 135.6 kg    Examination:  General exam: Moderately significant abdominal discomfort, morbidly obese HEENT: PERRL Respiratory system:  no wheezes or crackles  Cardiovascular system: Irregularly irregular rhythm.  Gastrointestinal system:  Abdomen is mildly distended, soft and nontender. Central nervous system: Alert and oriented Extremities: No edema, no clubbing ,no cyanosis Skin: No rashes, no ulcers,no icterus    Data Reviewed: I have personally reviewed following labs and imaging studies  CBC: Recent Labs  Lab 07/20/20 0100 07/22/20 0301  WBC 19.3* 16.1*  NEUTROABS  --  12.7*  HGB 8.3* 7.8*  HCT 25.8* 25.1*  MCV 88.1 90.3  PLT 570* 505*   Basic Metabolic Panel: Recent Labs  Lab 07/21/20 0455 07/21/20 0707 07/21/20 1052 07/21/20 1546 07/22/20 0301  NA 134* 133* 133* 136 137  K 3.9 3.5 4.1 3.4* 3.5  CL 96* 96* 100 102 102  CO2 13* 19* 19* 23 23  GLUCOSE 480* 466* 285* 215* 338*  BUN 69* 65* 63* 61* 51*  CREATININE 4.13* 4.00* 3.33* 3.32* 2.74*  CALCIUM 8.7* 8.6* 8.8* 8.7* 8.5*   GFR: Estimated Creatinine Clearance: 32.5 mL/min (A) (by C-G formula based on SCr of 2.74 mg/dL (H)). Liver Function Tests: Recent Labs  Lab 07/20/20 0100  AST 14*  ALT 8  ALKPHOS 99  BILITOT 1.3*  PROT 8.2*  ALBUMIN 3.2*   Recent Labs  Lab 07/20/20 0100  LIPASE 24   No results for input(s): AMMONIA in the last 168 hours. Coagulation Profile: No results for input(s): INR, PROTIME in the last 168 hours. Cardiac Enzymes: No results for input(s): CKTOTAL, CKMB, CKMBINDEX, TROPONINI in the last 168 hours. BNP (last 3 results) No results for input(s): PROBNP in the last 8760 hours. HbA1C: Recent Labs    07/21/20 0707  HGBA1C 10.5*   CBG: Recent Labs  Lab 07/21/20 1839 07/21/20 2019 07/21/20 2219 07/22/20 0511 07/22/20 0725  GLUCAP 195* 156* 181* 354* 339*   Lipid Profile: No results for input(s): CHOL, HDL, LDLCALC, TRIG, CHOLHDL, LDLDIRECT in the last 72 hours. Thyroid Function Tests: No results for input(s): TSH, T4TOTAL, FREET4, T3FREE, THYROIDAB in the last 72 hours. Anemia Panel: No results for input(s): VITAMINB12, FOLATE, FERRITIN, TIBC, IRON, RETICCTPCT in the last 72 hours. Sepsis  Labs: No results for input(s): PROCALCITON, LATICACIDVEN in the last 168 hours.  Recent Results (from the past 240 hour(s))  Resp Panel by RT-PCR (Flu A&B, Covid) Urine, Clean Catch     Status: None   Collection Time: 07/21/20  4:55 AM   Specimen: Urine, Clean Catch; Nasopharyngeal(NP) swabs in vial transport medium  Result Value Ref Range Status   SARS Coronavirus 2 by RT PCR NEGATIVE NEGATIVE Final    Comment: (NOTE) SARS-CoV-2 target nucleic acids are NOT DETECTED.  The SARS-CoV-2 RNA is generally detectable in upper respiratory specimens during the acute phase of infection. The lowest concentration of SARS-CoV-2 viral copies this assay can detect is 138 copies/mL. A negative result does not preclude SARS-Cov-2 infection and should not be used as the sole basis for treatment or other patient management decisions. A negative result may occur with  improper specimen collection/handling, submission of specimen other than nasopharyngeal swab, presence of viral  mutation(s) within the areas targeted by this assay, and inadequate number of viral copies(<138 copies/mL). A negative result must be combined with clinical observations, patient history, and epidemiological information. The expected result is Negative.  Fact Sheet for Patients:  EntrepreneurPulse.com.au  Fact Sheet for Healthcare Providers:  IncredibleEmployment.be  This test is no t yet approved or cleared by the [place] FDA and  has been authorized for detection and/or diagnosis of SARS-CoV-2 by FDA under an Emergency Use Authorization (EUA). This EUA will remain  in effect (meaning this test can be used) for the duration of the COVID-19 declaration under Section 564(b)(1) of the Act, 21 U.S.C.section 360bbb-3(b)(1), unless the authorization is terminated  or revoked sooner.       Influenza A by PCR NEGATIVE NEGATIVE Final   Influenza B by PCR NEGATIVE NEGATIVE Final     Comment: (NOTE) The Xpert Xpress SARS-CoV-2/FLU/RSV plus assay is intended as an aid in the diagnosis of influenza from Nasopharyngeal swab specimens and should not be used as a sole basis for treatment. Nasal washings and aspirates are unacceptable for Xpert Xpress SARS-CoV-2/FLU/RSV testing.  Fact Sheet for Patients: EntrepreneurPulse.com.au  Fact Sheet for Healthcare Providers: IncredibleEmployment.be  This test is not yet approved or cleared by the [place] FDA and has been authorized for detection and/or diagnosis of SARS-CoV-2 by FDA under an Emergency Use Authorization (EUA). This EUA will remain in effect (meaning this test can be used) for the duration of the COVID-19 declaration under Section 564(b)(1) of the Act, 21 U.S.C. section 360bbb-3(b)(1), unless the authorization is terminated or revoked.  Performed at [facility], [place] [address] [place], [place] [address]   MRSA PCR Screening     Status: Abnormal   Collection Time: 07/21/20  6:16 AM   Specimen: Nasopharyngeal  Result Value Ref Range Status   MRSA by PCR POSITIVE (A) NEGATIVE Final    Comment:        The GeneXpert MRSA Assay (FDA approved for NASAL specimens only), is one component of a comprehensive MRSA colonization surveillance program. It is not intended to diagnose MRSA infection nor to guide or monitor treatment for MRSA infections. RESULT CALLED TO, READ BACK BY AND VERIFIED WITH: Avon Gully RN AT [phone] ON 07/21/20 BY Buel Ream Performed at [facility], [place] [address], [place], [place] [address]          Radiology Studies: DG Chest Port 1 View  Result Date: 07/21/2020 CLINICAL DATA:  Nausea vomiting abdominal pain EXAM: PORTABLE CHEST 1 VIEW COMPARISON:  05/25/2020 FINDINGS: The heart size and mediastinal contours are within normal limits. Both lungs are clear. The visualized skeletal structures are unremarkable.  IMPRESSION: No active disease. Electronically Signed   By: Fidela Salisbury MD   On: 07/21/2020 01:41        Scheduled Meds: . apixaban  5 mg Oral BID  . atorvastatin  10 mg Oral Daily  . Chlorhexidine Gluconate Cloth  6 each Topical Q0600  . DULoxetine  40 mg Oral Daily  . hydrALAZINE  50 mg Oral TID  . insulin aspart  0-20 Units Subcutaneous TID WC  . insulin aspart  0-5 Units Subcutaneous QHS  . insulin glargine  20 Units Subcutaneous BID  . isosorbide mononitrate  60 mg Oral Daily  . metoprolol succinate  25 mg Oral Daily  . mupirocin ointment  1 application Nasal BID  . pantoprazole  40 mg Oral BID  . senna  1 tablet Oral QHS  Continuous Infusions: . lactated ringers 75 mL/hr at 07/22/20 0531  . promethazine (PHENERGAN) injection (IM or IVPB) Stopped (07/21/20 1531)     LOS: 0 days    Time spent: 35 mins.More than 50% of that time was spent in counseling and/or coordination of care.      Shelly Coss, MD [facility] P5/17/2022, 7:33 AM

## 2020-07-22 NOTE — Progress Notes (Signed)
Patient complained of having tremors.  This RN witnessed the tremors and they only appear when the patient is awake.  Tremors not seen when patient is sleeping.  Night coverage MD made aware.

## 2020-07-23 ENCOUNTER — Encounter (HOSPITAL_COMMUNITY): Payer: Self-pay | Admitting: Internal Medicine

## 2020-07-23 ENCOUNTER — Inpatient Hospital Stay (HOSPITAL_COMMUNITY): Payer: Medicare Other

## 2020-07-23 DIAGNOSIS — E0810 Diabetes mellitus due to underlying condition with ketoacidosis without coma: Secondary | ICD-10-CM | POA: Diagnosis not present

## 2020-07-23 DIAGNOSIS — R079 Chest pain, unspecified: Secondary | ICD-10-CM

## 2020-07-23 LAB — GLUCOSE, CAPILLARY
Glucose-Capillary: 282 mg/dL — ABNORMAL HIGH (ref 70–99)
Glucose-Capillary: 302 mg/dL — ABNORMAL HIGH (ref 70–99)
Glucose-Capillary: 194 mg/dL — ABNORMAL HIGH (ref 70–99)
Glucose-Capillary: 171 mg/dL — ABNORMAL HIGH (ref 70–99)

## 2020-07-23 LAB — BASIC METABOLIC PANEL
Anion gap: 8 (ref 5–15)
BUN: 46 mg/dL — ABNORMAL HIGH (ref 6–20)
CO2: 25 mmol/L (ref 22–32)
Calcium: 8.1 mg/dL — ABNORMAL LOW (ref 8.9–10.3)
Chloride: 103 mmol/L (ref 98–111)
Creatinine, Ser: 3.26 mg/dL — ABNORMAL HIGH (ref 0.44–1.00)
GFR, Estimated: 16 mL/min — ABNORMAL LOW (ref >60)
Glucose, Bld: 223 mg/dL — ABNORMAL HIGH (ref 70–99)
Potassium: 3.4 mmol/L — ABNORMAL LOW (ref 3.5–5.1)
Sodium: 136 mmol/L (ref 135–145)

## 2020-07-23 LAB — CBC WITH DIFFERENTIAL/PLATELET
Abs Immature Granulocytes: 0.07 10*3/uL (ref 0.00–0.07)
Basophils Absolute: 0.1 10*3/uL (ref 0.0–0.1)
Basophils Relative: 1 %
Eosinophils Absolute: 0.3 10*3/uL (ref 0.0–0.5)
Eosinophils Relative: 2 %
HCT: 24 % — ABNORMAL LOW (ref 36.0–46.0)
Hemoglobin: 7.2 g/dL — ABNORMAL LOW (ref 12.0–15.0)
Immature Granulocytes: 1 %
Lymphocytes Relative: 30 %
Lymphs Abs: 3.6 10*3/uL (ref 0.7–4.0)
MCH: 28 pg (ref 26.0–34.0)
MCHC: 30 g/dL (ref 30.0–36.0)
MCV: 93.4 fL (ref 80.0–100.0)
Monocytes Absolute: 0.8 10*3/uL (ref 0.1–1.0)
Monocytes Relative: 7 %
Neutro Abs: 7.2 10*3/uL (ref 1.7–7.7)
Neutrophils Relative %: 59 %
Platelets: 437 10*3/uL — ABNORMAL HIGH (ref 150–400)
RBC: 2.57 MIL/uL — ABNORMAL LOW (ref 3.87–5.11)
RDW: 14.7 % (ref 11.5–15.5)
WBC: 12 10*3/uL — ABNORMAL HIGH (ref 4.0–10.5)
nRBC: 0 % (ref 0.0–0.2)

## 2020-07-23 LAB — IRON AND TIBC
Iron: 92 ug/dL (ref 28–170)
Saturation Ratios: 24 % (ref 10.4–31.8)
TIBC: 381 ug/dL (ref 250–450)
UIBC: 289 ug/dL

## 2020-07-23 LAB — TROPONIN I (HIGH SENSITIVITY)
Troponin I (High Sensitivity): 35 ng/L — ABNORMAL HIGH (ref <18)
Troponin I (High Sensitivity): 28 ng/L — ABNORMAL HIGH (ref <18)

## 2020-07-23 LAB — FERRITIN: Ferritin: 1032 ng/mL — ABNORMAL HIGH (ref 11–307)

## 2020-07-23 MED ORDER — INSULIN ASPART 100 UNIT/ML IJ SOLN
8.0000 [IU] | Freq: Three times a day (TID) | INTRAMUSCULAR | Status: DC
  Administered 2020-07-24 – 2020-07-27 (×8): 8 [IU] via SUBCUTANEOUS

## 2020-07-23 MED ORDER — METOPROLOL SUCCINATE ER 50 MG PO TB24
50.0000 mg | ORAL_TABLET | Freq: Every day | ORAL | Status: DC
  Administered 2020-07-24 – 2020-07-27 (×4): 50 mg via ORAL
  Filled 2020-07-23 (×4): qty 1

## 2020-07-23 MED ORDER — HYDRALAZINE HCL 25 MG PO TABS
25.0000 mg | ORAL_TABLET | Freq: Three times a day (TID) | ORAL | Status: DC
  Administered 2020-07-23 (×2): 25 mg via ORAL
  Filled 2020-07-23 (×2): qty 1

## 2020-07-23 MED ORDER — LIDOCAINE VISCOUS HCL 2 % MT SOLN: 15.0000 mL | Freq: Once | OROMUCOSAL | Status: DC

## 2020-07-23 MED ORDER — NITROGLYCERIN 0.4 MG SL SUBL
0.4000 mg | SUBLINGUAL_TABLET | SUBLINGUAL | Status: DC | PRN
  Administered 2020-07-23 (×2): 0.4 mg via SUBLINGUAL
  Filled 2020-07-23 (×2): qty 1

## 2020-07-23 MED ORDER — HYDROMORPHONE HCL 2 MG PO TABS: 1.0000 mg | ORAL_TABLET | Freq: Four times a day (QID) | ORAL | Status: DC | PRN

## 2020-07-23 MED ORDER — ALUM & MAG HYDROXIDE-SIMETH 200-200-20 MG/5ML PO SUSP
30.0000 mL | Freq: Four times a day (QID) | ORAL | Status: DC | PRN
  Administered 2020-07-23 (×2): 30 mL via ORAL
  Filled 2020-07-23 (×2): qty 30

## 2020-07-23 MED ORDER — INSULIN GLARGINE 100 UNIT/ML ~~LOC~~ SOLN
25.0000 [IU] | Freq: Two times a day (BID) | SUBCUTANEOUS | Status: DC
  Administered 2020-07-23 – 2020-07-27 (×8): 25 [IU] via SUBCUTANEOUS
  Filled 2020-07-23 (×12): qty 0.25

## 2020-07-23 MED ORDER — ALUM & MAG HYDROXIDE-SIMETH 200-200-20 MG/5ML PO SUSP: 30.0000 mL | Freq: Once | ORAL | Status: DC

## 2020-07-23 MED ORDER — METOPROLOL SUCCINATE ER 25 MG PO TB24
25.0000 mg | ORAL_TABLET | Freq: Every day | ORAL | Status: DC
  Administered 2020-07-23: 25 mg via ORAL
  Filled 2020-07-23: qty 1

## 2020-07-23 MED ORDER — HYDROMORPHONE HCL 1 MG/ML IJ SOLN
0.5000 mg | INTRAMUSCULAR | Status: DC | PRN
Start: 2020-07-23 — End: 2020-07-27
  Administered 2020-07-23 – 2020-07-27 (×21): 0.5 mg via INTRAVENOUS
  Filled 2020-07-23: qty 1
  Filled 2020-07-23 (×10): qty 0.5
  Filled 2020-07-23: qty 1
  Filled 2020-07-23 (×7): qty 0.5
  Filled 2020-07-23: qty 1
  Filled 2020-07-23 (×2): qty 0.5

## 2020-07-23 MED ORDER — POTASSIUM CHLORIDE CRYS ER 20 MEQ PO TBCR
40.0000 meq | EXTENDED_RELEASE_TABLET | Freq: Once | ORAL | Status: AC
  Administered 2020-07-23: 40 meq via ORAL
  Filled 2020-07-23: qty 2

## 2020-07-23 MED ORDER — HYDRALAZINE HCL 50 MG PO TABS
50.0000 mg | ORAL_TABLET | Freq: Three times a day (TID) | ORAL | Status: DC
  Administered 2020-07-23 – 2020-07-27 (×12): 50 mg via ORAL
  Filled 2020-07-23 (×12): qty 1

## 2020-07-23 NOTE — Progress Notes (Signed)
Patient complained chest pain rated 10/10 , pointed to Chest and epigastric region, verbalized that pain is different from the pain on admission. Blood pressure 159/75 and HR 69-112bpm. Patient given heartburn medication and Hydromorphine prior to complaint of chest pain. EKG performed. On call Doctor informed of same.

## 2020-07-23 NOTE — Progress Notes (Signed)
Inpatient Diabetes Program Recommendations  AACE/ADA: New Consensus Statement on Inpatient Glycemic Control (2015)  Target Ranges:  Prepandial:   less than 140 mg/dL      Peak postprandial:   less than 180 mg/dL (1-2 hours)      Critically ill patients:  140 - 180 mg/dL   Lab Results  Component Value Date   GLUCAP 282 (H) 07/23/2020   HGBA1C 10.5 (H) 07/21/2020    Review of Glycemic Control  CBGs 5/17 - 354, 339, 234, 274, 247 Today -  223, 282   Inpatient Diabetes Program Recommendations:     Increase Lantus to 25 units BID Add Novolog 8 units TID with meals if eating > 50% meal  Have attempted to speak with pt on phone to ask about her diabetes management at home. Will try again later this am.   Will need to f/u with her PCP within a week of discharge.  Thank you. Lorenda Peck, RD, LDN, CDE Inpatient Diabetes Coordinator [phone]

## 2020-07-23 NOTE — Progress Notes (Addendum)
Cross-coverage note:   Patient seen for chest pain. She reports acute-onset of pain in lower chest while at rest in bed that is different than her typical epigastric pain and associated with SOB.   She denies any missed doses of Eliquis recently and has been saturating well on rm air.   EKG demonstrates sinus tachycardia with PVC and no acute ischemic features. Plan to also check CXR and troponin x2.   ADDENDUM: Troponin only slightly elevated and decreasing, no acute CXR findings, symptoms subsided and patient now sleeping.

## 2020-07-23 NOTE — Progress Notes (Signed)
PROGRESS NOTE    Deborah [name]  QMG:[id number] DOB: 16-Dec-1965 DOA: 07/20/2020 PCP: Cleophas Dunker, DO   Chief Complain: Nausea, vomiting, abdominal pain  Brief Narrative: Patient is a 55 year old female with history of diabetes type 2, hypertension, gastroparesis, CKD stage IIIb who presents to the emergency room with complaint of nausea, vomiting, abdominal discomfort.  She was found to be in DKA and was started on DKA protocol.  Lab work also showed AKI on her CKD on presentation.  Currently on sliding scale and Lantus.  Diabetic coordinator following.  Hemoglobin A1c of more than 10.  Hospital course remarkable for persistent abdominal pain.  Assessment & Plan:   Principal Problem:   DKA (diabetic ketoacidosis) ([place]) Active Problems:   Essential hypertension, benign   CKD (chronic kidney disease), stage II   ARF (acute renal failure) ([place])   DKA: Patient states she is compliant with her medications.  Hemoglobin A1c of more than 10.  Diabetic coordinator  following.  Started on DKA protocol.  Gap has closed, currently she is on Lantus and sliding scale.  She is still hyperglycemic this morning.Dose of insulin increased.  Abdominal pain/diabetic gastroparesis: Presented with nausea, vomiting, abdominal pain.  Cannot tolerate Reglan due to history of tardive dyskinesia.  Continue Zofran, Phenergan.  Continue IV fluids.  Patient will be instructed to take small-volume frequent nonfatty meals. She has history of malignant carcinoid tumor of duodenum, status post resection.  She had persistent abdominal discomfort now improving,diet advanced today. Abdominal CT/pelvis without contrast did not show any acute intra-abdominal findings.  AKI on CKD stage IV: Creatinine in the range of 4 on presentation most likely precipitated due to dehydration from nausea/vomiting.  Continue IV fluids.Her baseline creatinine is around 2.  Kidney function improved  with IV fluids,now again trended  up.  She needs nephrology follow-up as an outpatient.  Hypertension: BP stable today.  Takes hydralazine, Imdur, metoprolol at home.  Increased the dose of hydralazine.  Paroxysmal A. fib:   On Eliquis for anticoagulation.  On metoprolol for rate control.  Monitor in telemetry  Normocytic anemia: Likely chronic and associated with chronic kidney disease.  Currently hemoglobin stable in the range of 8-9,at baseline.  Monitor.  Normal iron as per  iron studies  Leukocytosis: Most likely reactive.Afebrile.  UA was not impressive for UTI.  Does not have any respiratory symptoms/low suspicion for pneumonia.  Continue to monitor           DVT prophylaxis:Eliquis Code Status: Full Family Communication: None at bedside Status is: Observation    Dispo: The patient is from: Home              Anticipated d/c is to: Home              Patient currently is not medically stable to d/c.   Difficult to place patient No    Consultants: None  Procedures:None  Antimicrobials:  Anti-infectives (From admission, onward)   None      Subjective:  Patient seen and examined at bedside this mrng. BP is better,abd pain is better today and she is intrested to advance the diet .No nasusea and vomiting  Objective: Vitals:   07/23/20 0300 07/23/20 0400 07/23/20 0500 07/23/20 0605  BP: 131/68 123/80 132/72 124/81  Pulse: 95  90 94  Resp: 16 (!) 25 11 18   Temp:  97.8 F (36.6 C)    TempSrc:  Oral    SpO2: 97% 96% 96% 96%  Weight:  Height:        Intake/Output Summary (Last 24 hours) at 07/23/2020 0732 Last data filed at 07/23/2020 0400 Gross per 24 hour  Intake 1541.91 ml  Output 600 ml  Net 941.91 ml   Filed Weights   07/20/20 2256  Weight: 135.6 kg    Examination:  General exam: Overall comfortable, not in distress,obese HEENT: PERRL Respiratory system:  no wheezes or crackles  Cardiovascular system: S1 & S2 heard, RRR.  Gastrointestinal system: Abdomen is nondistended,  soft and nontender. Central nervous system: Alert and oriented Extremities: No edema, no clubbing ,no cyanosis Skin: No rashes, no ulcers,no icterus     Data Reviewed: I have personally reviewed following labs and imaging studies  CBC: Recent Labs  Lab 07/20/20 0100 07/22/20 0301 07/23/20 0253  WBC 19.3* 16.1* 12.0*  NEUTROABS  --  12.7* 7.2  HGB 8.3* 7.8* 7.2*  HCT 25.8* 25.1* 24.0*  MCV 88.1 90.3 93.4  PLT 570* 492* 354*   Basic Metabolic Panel: Recent Labs  Lab 07/21/20 0707 07/21/20 1052 07/21/20 1546 07/22/20 0301 07/23/20 0253  NA 133* 133* 136 137 136  K 3.5 4.1 3.4* 3.5 3.4*  CL 96* 100 102 102 103  CO2 19* 19* 23 23 25   GLUCOSE 466* 285* 215* 338* 223*  BUN 65* 63* 61* 51* 46*  CREATININE 4.00* 3.33* 3.32* 2.74* 3.26*  CALCIUM 8.6* 8.8* 8.7* 8.5* 8.1*   GFR: Estimated Creatinine Clearance: 27.3 mL/min (A) (by C-G formula based on SCr of 3.26 mg/dL (H)). Liver Function Tests: Recent Labs  Lab 07/20/20 0100  AST 14*  ALT 8  ALKPHOS 99  BILITOT 1.3*  PROT 8.2*  ALBUMIN 3.2*   Recent Labs  Lab 07/20/20 0100  LIPASE 24   No results for input(s): AMMONIA in the last 168 hours. Coagulation Profile: No results for input(s): INR, PROTIME in the last 168 hours. Cardiac Enzymes: No results for input(s): CKTOTAL, CKMB, CKMBINDEX, TROPONINI in the last 168 hours. BNP (last 3 results) No results for input(s): PROBNP in the last 8760 hours. HbA1C: Recent Labs    07/21/20 0707  HGBA1C 10.5*   CBG: Recent Labs  Lab 07/22/20 0511 07/22/20 0725 07/22/20 1127 07/22/20 1626 07/22/20 2137  GLUCAP 354* 339* 234* 274* 247*   Lipid Profile: No results for input(s): CHOL, HDL, LDLCALC, TRIG, CHOLHDL, LDLDIRECT in the last 72 hours. Thyroid Function Tests: No results for input(s): TSH, T4TOTAL, FREET4, T3FREE, THYROIDAB in the last 72 hours. Anemia Panel: No results for input(s): VITAMINB12, FOLATE, FERRITIN, TIBC, IRON, RETICCTPCT in the last 72  hours. Sepsis Labs: Recent Labs  Lab 07/22/20 1258  LATICACIDVEN 1.6    Recent Results (from the past 240 hour(s))  Resp Panel by RT-PCR (Flu A&B, Covid) Urine, Clean Catch     Status: None   Collection Time: 07/21/20  4:55 AM   Specimen: Urine, Clean Catch; Nasopharyngeal(NP) swabs in vial transport medium  Result Value Ref Range Status   SARS Coronavirus 2 by RT PCR NEGATIVE NEGATIVE Final    Comment: (NOTE) SARS-CoV-2 target nucleic acids are NOT DETECTED.  The SARS-CoV-2 RNA is generally detectable in upper respiratory specimens during the acute phase of infection. The lowest concentration of SARS-CoV-2 viral copies this assay can detect is 138 copies/mL. A negative result does not preclude SARS-Cov-2 infection and should not be used as the sole basis for treatment or other patient management decisions. A negative result may occur with  improper specimen collection/handling, submission of specimen other than nasopharyngeal  swab, presence of viral mutation(s) within the areas targeted by this assay, and inadequate number of viral copies(<138 copies/mL). A negative result must be combined with clinical observations, patient history, and epidemiological information. The expected result is Negative.  Fact Sheet for Patients:  EntrepreneurPulse.com.au  Fact Sheet for Healthcare Providers:  IncredibleEmployment.be  This test is no t yet approved or cleared by the [place] FDA and  has been authorized for detection and/or diagnosis of SARS-CoV-2 by FDA under an Emergency Use Authorization (EUA). This EUA will remain  in effect (meaning this test can be used) for the duration of the COVID-19 declaration under Section 564(b)(1) of the Act, 21 U.S.C.section 360bbb-3(b)(1), unless the authorization is terminated  or revoked sooner.       Influenza A by PCR NEGATIVE NEGATIVE Final   Influenza B by PCR NEGATIVE NEGATIVE Final    Comment:  (NOTE) The Xpert Xpress SARS-CoV-2/FLU/RSV plus assay is intended as an aid in the diagnosis of influenza from Nasopharyngeal swab specimens and should not be used as a sole basis for treatment. Nasal washings and aspirates are unacceptable for Xpert Xpress SARS-CoV-2/FLU/RSV testing.  Fact Sheet for Patients: EntrepreneurPulse.com.au  Fact Sheet for Healthcare Providers: IncredibleEmployment.be  This test is not yet approved or cleared by the [place] FDA and has been authorized for detection and/or diagnosis of SARS-CoV-2 by FDA under an Emergency Use Authorization (EUA). This EUA will remain in effect (meaning this test can be used) for the duration of the COVID-19 declaration under Section 564(b)(1) of the Act, 21 U.S.C. section 360bbb-3(b)(1), unless the authorization is terminated or revoked.  Performed at [facility], [place] [address] [place], [place] [address]   MRSA PCR Screening     Status: Abnormal   Collection Time: 07/21/20  6:16 AM   Specimen: Nasopharyngeal  Result Value Ref Range Status   MRSA by PCR POSITIVE (A) NEGATIVE Final    Comment:        The GeneXpert MRSA Assay (FDA approved for NASAL specimens only), is one component of a comprehensive MRSA colonization surveillance program. It is not intended to diagnose MRSA infection nor to guide or monitor treatment for MRSA infections. RESULT CALLED TO, READ BACK BY AND VERIFIED WITH: Avon Gully RN AT [phone] ON 07/21/20 BY Buel Ream Performed at [facility], [place] [address], [place], [place] [address]          Radiology Studies: CT ABDOMEN PELVIS WO CONTRAST  Result Date: 07/22/2020 CLINICAL DATA:  55 year old with acute abdominal pain. EXAM: CT ABDOMEN AND PELVIS WITHOUT CONTRAST TECHNIQUE: Multidetector CT imaging of the abdomen and pelvis was performed following the standard protocol without IV contrast. COMPARISON:   05/29/2020 FINDINGS: Lower chest: Patchy densities in the right lower lobe are suggestive for atelectasis. No large pleural effusions. Hepatobiliary: Gallbladder has been removed. No focal abnormality to the liver. No significant biliary dilatation. Pancreas: Unremarkable. No pancreatic ductal dilatation or surrounding inflammatory changes. Spleen: Normal in size without focal abnormality. Adrenals/Urinary Tract: Normal appearance of the adrenal glands. Normal appearance of the urinary bladder. Negative for kidney stones or hydronephrosis. Stomach/Bowel: Stomach is within normal limits. No evidence of bowel wall thickening, distention, or inflammatory changes. Vascular/Lymphatic: Small amount of wall calcifications involving the abdominal aorta without aneurysm. No significant lymph node enlargement in the abdomen or pelvis. Reproductive: Again noted is a rounded structure in the region of the left adnexa that measures up to 4.6 cm and stable. This was described as an exophytic  fibroid on previous MRI from 11/29/2017. Otherwise, stable appearance of the uterus and adnexal structures. Other: Negative for ascites.  Negative for free air. Musculoskeletal: Significant degenerative facet disease in lower lumbar spine. IMPRESSION: 1. No acute abnormality in the abdomen or pelvis. 2. Cholecystectomy. 3.  Aortic Atherosclerosis (ICD10-I70.0). Electronically Signed   By: Markus Daft M.D.   On: 07/22/2020 12:57        Scheduled Meds: . apixaban  5 mg Oral BID  . atorvastatin  10 mg Oral Daily  . Chlorhexidine Gluconate Cloth  6 each Topical Q0600  . DULoxetine  40 mg Oral Daily  . hydrALAZINE  50 mg Oral TID  . insulin aspart  0-20 Units Subcutaneous TID WC  . insulin aspart  0-5 Units Subcutaneous QHS  . insulin glargine  20 Units Subcutaneous BID  . isosorbide mononitrate  60 mg Oral Daily  . metoprolol succinate  50 mg Oral Daily  . mupirocin ointment  1 application Nasal BID  . pantoprazole  40 mg Oral  BID  . potassium chloride  40 mEq Oral Once  . senna  1 tablet Oral QHS   Continuous Infusions: . lactated ringers 75 mL/hr at 07/23/20 0400  . promethazine (PHENERGAN) injection (IM or IVPB) Stopped (07/21/20 1531)     LOS: 1 day    Time spent: 35 mins.More than 50% of that time was spent in counseling and/or coordination of care.      Shelly Coss, MD [facility] P5/18/2022, 7:32 AM

## 2020-07-24 DIAGNOSIS — E0810 Diabetes mellitus due to underlying condition with ketoacidosis without coma: Secondary | ICD-10-CM | POA: Diagnosis not present

## 2020-07-24 LAB — CBC WITH DIFFERENTIAL/PLATELET
Abs Immature Granulocytes: 0.06 10*3/uL (ref 0.00–0.07)
Basophils Absolute: 0.1 10*3/uL (ref 0.0–0.1)
Basophils Relative: 0 %
Eosinophils Absolute: 0.2 10*3/uL (ref 0.0–0.5)
Eosinophils Relative: 2 %
HCT: 21.9 % — ABNORMAL LOW (ref 36.0–46.0)
Hemoglobin: 6.7 g/dL — CL (ref 12.0–15.0)
Immature Granulocytes: 0 %
Lymphocytes Relative: 23 %
Lymphs Abs: 3.2 10*3/uL (ref 0.7–4.0)
MCH: 27.6 pg (ref 26.0–34.0)
MCHC: 30.6 g/dL (ref 30.0–36.0)
MCV: 90.1 fL (ref 80.0–100.0)
Monocytes Absolute: 0.7 10*3/uL (ref 0.1–1.0)
Monocytes Relative: 5 %
Neutro Abs: 9.9 10*3/uL — ABNORMAL HIGH (ref 1.7–7.7)
Neutrophils Relative %: 70 %
Platelets: 445 10*3/uL — ABNORMAL HIGH (ref 150–400)
RBC: 2.43 MIL/uL — ABNORMAL LOW (ref 3.87–5.11)
RDW: 14.4 % (ref 11.5–15.5)
WBC: 14.2 10*3/uL — ABNORMAL HIGH (ref 4.0–10.5)
nRBC: 0 % (ref 0.0–0.2)

## 2020-07-24 LAB — GLUCOSE, CAPILLARY
Glucose-Capillary: 117 mg/dL — ABNORMAL HIGH (ref 70–99)
Glucose-Capillary: 288 mg/dL — ABNORMAL HIGH (ref 70–99)
Glucose-Capillary: 234 mg/dL — ABNORMAL HIGH (ref 70–99)
Glucose-Capillary: 228 mg/dL — ABNORMAL HIGH (ref 70–99)

## 2020-07-24 LAB — BASIC METABOLIC PANEL
Anion gap: 7 (ref 5–15)
BUN: 38 mg/dL — ABNORMAL HIGH (ref 6–20)
CO2: 27 mmol/L (ref 22–32)
Calcium: 8 mg/dL — ABNORMAL LOW (ref 8.9–10.3)
Chloride: 103 mmol/L (ref 98–111)
Creatinine, Ser: 2.64 mg/dL — ABNORMAL HIGH (ref 0.44–1.00)
GFR, Estimated: 21 mL/min — ABNORMAL LOW (ref >60)
Glucose, Bld: 135 mg/dL — ABNORMAL HIGH (ref 70–99)
Potassium: 3.3 mmol/L — ABNORMAL LOW (ref 3.5–5.1)
Sodium: 137 mmol/L (ref 135–145)

## 2020-07-24 LAB — PREPARE RBC (CROSSMATCH)

## 2020-07-24 LAB — OCCULT BLOOD X 1 CARD TO LAB, STOOL: Fecal Occult Bld: POSITIVE — AB

## 2020-07-24 LAB — HEMOGLOBIN AND HEMATOCRIT, BLOOD
HCT: 29.3 % — ABNORMAL LOW (ref 36.0–46.0)
Hemoglobin: 9.1 g/dL — ABNORMAL LOW (ref 12.0–15.0)

## 2020-07-24 MED ORDER — PANTOPRAZOLE SODIUM 40 MG IV SOLR
40.0000 mg | Freq: Two times a day (BID) | INTRAVENOUS | Status: DC
  Administered 2020-07-24 – 2020-07-25 (×3): 40 mg via INTRAVENOUS
  Filled 2020-07-24 (×3): qty 40

## 2020-07-24 MED ORDER — POTASSIUM CHLORIDE CRYS ER 10 MEQ PO TBCR
40.0000 meq | EXTENDED_RELEASE_TABLET | Freq: Once | ORAL | Status: AC
  Administered 2020-07-24: 40 meq via ORAL
  Filled 2020-07-24: qty 4

## 2020-07-24 MED ORDER — SODIUM CHLORIDE 0.9 % IV SOLN: INTRAVENOUS | Status: DC

## 2020-07-24 MED ORDER — SODIUM CHLORIDE 0.9% IV SOLUTION: Freq: Once | INTRAVENOUS | Status: AC

## 2020-07-24 NOTE — Progress Notes (Addendum)
PROGRESS NOTE    Deborah [name]  NID:[id number] DOB: 11-26-1965 DOA: 07/20/2020 PCP: Cleophas Dunker, DO   Chief Complain: Nausea, vomiting, abdominal pain  Brief Narrative: Patient is a 55 year old female with history of diabetes type 2, hypertension, gastroparesis, CKD stage IIIb who presents to the emergency room with complaint of nausea, vomiting, abdominal discomfort.  She was found to be in DKA and was started on DKA protocol.  Lab work also showed AKI on her CKD on presentation.  Currently on sliding scale and Lantus.  Diabetic coordinator following.  Hemoglobin A1c of more than 10.  Hospital course remarkable for persistent abdominal pain.  Assessment & Plan:   Principal Problem:   DKA (diabetic ketoacidosis) ([place]) Active Problems:   Essential hypertension, benign   CKD (chronic kidney disease), stage II   Chest pain   ARF (acute renal failure) ([place])   DKA: Patient states she is compliant with her medications.  Hemoglobin A1c of more than 10.  Diabetic coordinator  following.  Started on DKA protocol.  Gap has closed, currently she is on Lantus and sliding scale.    Abdominal pain/diabetic gastroparesis: Presented with nausea, vomiting, abdominal pain.  Cannot tolerate Reglan due to history of tardive dyskinesia.  Continue Zofran, Phenergan.  Continue IV fluids.  Patient will be instructed to take small-volume frequent nonfatty meals. She has history of malignant carcinoid tumor of duodenum, status post resection.  She had persistent abdominal discomfort now improving,diet advanced today. Abdominal CT/pelvis without contrast did not show any acute intra-abdominal findings.  AKI on CKD stage IV: Creatinine in the range of 4 on presentation most likely precipitated due to dehydration from nausea/vomiting.  Continue IV fluids.Her baseline creatinine is around 2.  Kidney function improving  with IV fluid.  She needs nephrology follow-up as an outpatient.  Hypertension:    Takes hydralazine, Imdur, metoprolol at home.  Increased the dose of hydralazine,metoprolol.  Paroxysmal A. fib:   On Eliquis for anticoagulation.  On metoprolol for rate control.  Monitor in telemetry  Normocytic anemia: Likely chronic and associated with chronic kidney disease.  Hemoglobin dropped to 6.7 today.  She denies any change in the color of the stool.  She is on Eliquis for A. fib.  We will check FOBT.  Normal iron as per  iron studies.  She is being transfused with a unit of PRBC today.  Leukocytosis: Most likely reactive.Afebrile.  UA was not impressive for UTI.  Does not have any respiratory symptoms/low suspicion for pneumonia.  Continue to monitor  Addendum: FOBT found to be positive.  GI consulted for possible consideration of EGD.  Eliquis held          DVT prophylaxis:Eliquis Code Status: Full Family Communication: None at bedside Status is: Inpatient    Dispo: The patient is from: Home              Anticipated d/c is to: Home              Patient currently is not medically stable to d/c.   Difficult to place patient No    Consultants: None  Procedures:None  Antimicrobials:  Anti-infectives (From admission, onward)   None      Subjective:   Patient seen and examined at the bedside this morning.  Sitting on the chair.  Still complains of some abdominal discomfort and nausea.  Her hemoglobin dropped to the range of 6.  She says her stool color is brown.  Being transfused with a unit  of PRBC.  Discharge canceled.  Objective: Vitals:   07/23/20 1855 07/23/20 2024 07/23/20 2052 07/24/20 0504  BP: (!) 161/106 (!) 161/106 (!) 158/79 124/65  Pulse: (!) 110 (!) 110 71 93  Resp: 14 14 (!) 23 (!) 21  Temp: 97.6 F (36.4 C) 97.6 F (36.4 C) 97.8 F (36.6 C) 98.6 F (37 C)  TempSrc: Oral Oral Oral   SpO2: 100% 100% 100% 99%  Weight:  136 kg    Height:  5\' 6"  (1.676 m)      Intake/Output Summary (Last 24 hours) at 07/24/2020 0848 Last data filed at  07/24/2020 7902 Gross per 24 hour  Intake 3056.74 ml  Output --  Net 3056.74 ml   Filed Weights   07/20/20 2256 07/23/20 2024  Weight: 135.6 kg 136 kg    Examination:  General exam: Overall comfortable, not in distress,obese HEENT: PERRL Respiratory system:  no wheezes or crackles  Cardiovascular system: S1 & S2 heard, RRR.  Gastrointestinal system: Abdomen is nondistended, soft and nontender. Central nervous system: Alert and oriented Extremities: No edema, no clubbing ,no cyanosis Skin: No rashes, no ulcers,no icterus     Data Reviewed: I have personally reviewed following labs and imaging studies  CBC: Recent Labs  Lab 07/20/20 0100 07/22/20 0301 07/23/20 0253 07/24/20 0512  WBC 19.3* 16.1* 12.0* 14.2*  NEUTROABS  --  12.7* 7.2 9.9*  HGB 8.3* 7.8* 7.2* 6.7*  HCT 25.8* 25.1* 24.0* 21.9*  MCV 88.1 90.3 93.4 90.1  PLT 570* 492* 437* 409*   Basic Metabolic Panel: Recent Labs  Lab 07/21/20 1052 07/21/20 1546 07/22/20 0301 07/23/20 0253 07/24/20 0512  NA 133* 136 137 136 137  K 4.1 3.4* 3.5 3.4* 3.3*  CL 100 102 102 103 103  CO2 19* 23 23 25 27   GLUCOSE 285* 215* 338* 223* 135*  BUN 63* 61* 51* 46* 38*  CREATININE 3.33* 3.32* 2.74* 3.26* 2.64*  CALCIUM 8.8* 8.7* 8.5* 8.1* 8.0*   GFR: Estimated Creatinine Clearance: 33.8 mL/min (A) (by C-G formula based on SCr of 2.64 mg/dL (H)). Liver Function Tests: Recent Labs  Lab 07/20/20 0100  AST 14*  ALT 8  ALKPHOS 99  BILITOT 1.3*  PROT 8.2*  ALBUMIN 3.2*   Recent Labs  Lab 07/20/20 0100  LIPASE 24   No results for input(s): AMMONIA in the last 168 hours. Coagulation Profile: No results for input(s): INR, PROTIME in the last 168 hours. Cardiac Enzymes: No results for input(s): CKTOTAL, CKMB, CKMBINDEX, TROPONINI in the last 168 hours. BNP (last 3 results) No results for input(s): PROBNP in the last 8760 hours. HbA1C: No results for input(s): HGBA1C in the last 72 hours. CBG: Recent Labs  Lab  07/23/20 0738 07/23/20 1143 07/23/20 1611 07/23/20 2055 07/24/20 0743  GLUCAP 282* 302* 194* 171* 117*   Lipid Profile: No results for input(s): CHOL, HDL, LDLCALC, TRIG, CHOLHDL, LDLDIRECT in the last 72 hours. Thyroid Function Tests: No results for input(s): TSH, T4TOTAL, FREET4, T3FREE, THYROIDAB in the last 72 hours. Anemia Panel: Recent Labs    07/23/20 0919  FERRITIN 1,032*  TIBC 381  IRON 92   Sepsis Labs: Recent Labs  Lab 07/22/20 1258  LATICACIDVEN 1.6    Recent Results (from the past 240 hour(s))  Resp Panel by RT-PCR (Flu A&B, Covid) Urine, Clean Catch     Status: None   Collection Time: 07/21/20  4:55 AM   Specimen: Urine, Clean Catch; Nasopharyngeal(NP) swabs in vial transport medium  Result Value Ref  Range Status   SARS Coronavirus 2 by RT PCR NEGATIVE NEGATIVE Final    Comment: (NOTE) SARS-CoV-2 target nucleic acids are NOT DETECTED.  The SARS-CoV-2 RNA is generally detectable in upper respiratory specimens during the acute phase of infection. The lowest concentration of SARS-CoV-2 viral copies this assay can detect is 138 copies/mL. A negative result does not preclude SARS-Cov-2 infection and should not be used as the sole basis for treatment or other patient management decisions. A negative result may occur with  improper specimen collection/handling, submission of specimen other than nasopharyngeal swab, presence of viral mutation(s) within the areas targeted by this assay, and inadequate number of viral copies(<138 copies/mL). A negative result must be combined with clinical observations, patient history, and epidemiological information. The expected result is Negative.  Fact Sheet for Patients:  EntrepreneurPulse.com.au  Fact Sheet for Healthcare Providers:  IncredibleEmployment.be  This test is no t yet approved or cleared by the [place] FDA and  has been authorized for detection and/or diagnosis of  SARS-CoV-2 by FDA under an Emergency Use Authorization (EUA). This EUA will remain  in effect (meaning this test can be used) for the duration of the COVID-19 declaration under Section 564(b)(1) of the Act, 21 U.S.C.section 360bbb-3(b)(1), unless the authorization is terminated  or revoked sooner.       Influenza A by PCR NEGATIVE NEGATIVE Final   Influenza B by PCR NEGATIVE NEGATIVE Final    Comment: (NOTE) The Xpert Xpress SARS-CoV-2/FLU/RSV plus assay is intended as an aid in the diagnosis of influenza from Nasopharyngeal swab specimens and should not be used as a sole basis for treatment. Nasal washings and aspirates are unacceptable for Xpert Xpress SARS-CoV-2/FLU/RSV testing.  Fact Sheet for Patients: EntrepreneurPulse.com.au  Fact Sheet for Healthcare Providers: IncredibleEmployment.be  This test is not yet approved or cleared by the [place] FDA and has been authorized for detection and/or diagnosis of SARS-CoV-2 by FDA under an Emergency Use Authorization (EUA). This EUA will remain in effect (meaning this test can be used) for the duration of the COVID-19 declaration under Section 564(b)(1) of the Act, 21 U.S.C. section 360bbb-3(b)(1), unless the authorization is terminated or revoked.  Performed at [facility], [place] [address] [place], [place] [address]   MRSA PCR Screening     Status: Abnormal   Collection Time: 07/21/20  6:16 AM   Specimen: Nasopharyngeal  Result Value Ref Range Status   MRSA by PCR POSITIVE (A) NEGATIVE Final    Comment:        The GeneXpert MRSA Assay (FDA approved for NASAL specimens only), is one component of a comprehensive MRSA colonization surveillance program. It is not intended to diagnose MRSA infection nor to guide or monitor treatment for MRSA infections. RESULT CALLED TO, READ BACK BY AND VERIFIED WITH: Avon Gully RN AT [phone] ON 07/21/20 BY Buel Ream Performed at  [facility], [place] [address], [place], [place] [address]          Radiology Studies: CT ABDOMEN PELVIS WO CONTRAST  Result Date: 07/22/2020 CLINICAL DATA:  55 year old with acute abdominal pain. EXAM: CT ABDOMEN AND PELVIS WITHOUT CONTRAST TECHNIQUE: Multidetector CT imaging of the abdomen and pelvis was performed following the standard protocol without IV contrast. COMPARISON:  05/29/2020 FINDINGS: Lower chest: Patchy densities in the right lower lobe are suggestive for atelectasis. No large pleural effusions. Hepatobiliary: Gallbladder has been removed. No focal abnormality to the liver. No significant biliary dilatation. Pancreas: Unremarkable. No pancreatic ductal dilatation or  surrounding inflammatory changes. Spleen: Normal in size without focal abnormality. Adrenals/Urinary Tract: Normal appearance of the adrenal glands. Normal appearance of the urinary bladder. Negative for kidney stones or hydronephrosis. Stomach/Bowel: Stomach is within normal limits. No evidence of bowel wall thickening, distention, or inflammatory changes. Vascular/Lymphatic: Small amount of wall calcifications involving the abdominal aorta without aneurysm. No significant lymph node enlargement in the abdomen or pelvis. Reproductive: Again noted is a rounded structure in the region of the left adnexa that measures up to 4.6 cm and stable. This was described as an exophytic fibroid on previous MRI from 11/29/2017. Otherwise, stable appearance of the uterus and adnexal structures. Other: Negative for ascites.  Negative for free air. Musculoskeletal: Significant degenerative facet disease in lower lumbar spine. IMPRESSION: 1. No acute abnormality in the abdomen or pelvis. 2. Cholecystectomy. 3.  Aortic Atherosclerosis (ICD10-I70.0). Electronically Signed   By: Markus Daft M.D.   On: 07/22/2020 12:57   DG CHEST PORT 1 VIEW  Result Date: 07/23/2020 CLINICAL DATA:  Chest pain EXAM: PORTABLE CHEST 1 VIEW  COMPARISON:  07/20/2020 FINDINGS: Cardiac shadow is mildly prominent but stable. Slight increased vascular congestion is noted without interstitial edema. No bony abnormality is noted. IMPRESSION: Mild increased vascular congestion.  No edema is noted. Electronically Signed   By: Inez Catalina M.D.   On: 07/23/2020 21:49        Scheduled Meds: . apixaban  5 mg Oral BID  . atorvastatin  10 mg Oral Daily  . Chlorhexidine Gluconate Cloth  6 each Topical Q0600  . DULoxetine  40 mg Oral Daily  . hydrALAZINE  50 mg Oral TID  . insulin aspart  0-20 Units Subcutaneous TID WC  . insulin aspart  0-5 Units Subcutaneous QHS  . insulin aspart  8 Units Subcutaneous TID WC  . insulin glargine  25 Units Subcutaneous BID  . isosorbide mononitrate  60 mg Oral Daily  . metoprolol succinate  50 mg Oral Daily  . mupirocin ointment  1 application Nasal BID  . pantoprazole  40 mg Oral BID  . potassium chloride  40 mEq Oral Once  . senna  1 tablet Oral QHS   Continuous Infusions: . promethazine (PHENERGAN) injection (IM or IVPB) Stopped (07/21/20 1531)     LOS: 2 days    Time spent: 35 mins.More than 50% of that time was spent in counseling and/or coordination of care.      Shelly Coss, MD [facility] P5/19/2022, 8:48 AM

## 2020-07-25 DIAGNOSIS — E0810 Diabetes mellitus due to underlying condition with ketoacidosis without coma: Secondary | ICD-10-CM | POA: Diagnosis not present

## 2020-07-25 LAB — CBC WITH DIFFERENTIAL/PLATELET
Abs Immature Granulocytes: 0.06 10*3/uL (ref 0.00–0.07)
Basophils Absolute: 0.1 10*3/uL (ref 0.0–0.1)
Basophils Relative: 1 %
Eosinophils Absolute: 0.3 10*3/uL (ref 0.0–0.5)
Eosinophils Relative: 2 %
HCT: 27.7 % — ABNORMAL LOW (ref 36.0–46.0)
Hemoglobin: 8.6 g/dL — ABNORMAL LOW (ref 12.0–15.0)
Immature Granulocytes: 0 %
Lymphocytes Relative: 17 %
Lymphs Abs: 2.5 10*3/uL (ref 0.7–4.0)
MCH: 28.5 pg (ref 26.0–34.0)
MCHC: 31 g/dL (ref 30.0–36.0)
MCV: 91.7 fL (ref 80.0–100.0)
Monocytes Absolute: 0.9 10*3/uL (ref 0.1–1.0)
Monocytes Relative: 6 %
Neutro Abs: 11.1 10*3/uL — ABNORMAL HIGH (ref 1.7–7.7)
Neutrophils Relative %: 74 %
Platelets: 425 10*3/uL — ABNORMAL HIGH (ref 150–400)
RBC: 3.02 MIL/uL — ABNORMAL LOW (ref 3.87–5.11)
RDW: 15.3 % (ref 11.5–15.5)
WBC: 15 10*3/uL — ABNORMAL HIGH (ref 4.0–10.5)
nRBC: 0 % (ref 0.0–0.2)

## 2020-07-25 LAB — BASIC METABOLIC PANEL
Anion gap: 7 (ref 5–15)
BUN: 39 mg/dL — ABNORMAL HIGH (ref 6–20)
CO2: 24 mmol/L (ref 22–32)
Calcium: 8 mg/dL — ABNORMAL LOW (ref 8.9–10.3)
Chloride: 106 mmol/L (ref 98–111)
Creatinine, Ser: 3.12 mg/dL — ABNORMAL HIGH (ref 0.44–1.00)
GFR, Estimated: 17 mL/min — ABNORMAL LOW (ref >60)
Glucose, Bld: 184 mg/dL — ABNORMAL HIGH (ref 70–99)
Potassium: 3.8 mmol/L (ref 3.5–5.1)
Sodium: 137 mmol/L (ref 135–145)

## 2020-07-25 LAB — TYPE AND SCREEN
ABO/RH(D): O POS
Antibody Screen: NEGATIVE
Unit division: 0

## 2020-07-25 LAB — BPAM RBC
Blood Product Expiration Date: 202206152359
ISSUE DATE / TIME: 202205191039
Unit Type and Rh: 5100

## 2020-07-25 LAB — GLUCOSE, CAPILLARY
Glucose-Capillary: 167 mg/dL — ABNORMAL HIGH (ref 70–99)
Glucose-Capillary: 215 mg/dL — ABNORMAL HIGH (ref 70–99)
Glucose-Capillary: 372 mg/dL — ABNORMAL HIGH (ref 70–99)
Glucose-Capillary: 270 mg/dL — ABNORMAL HIGH (ref 70–99)

## 2020-07-25 NOTE — Progress Notes (Signed)
PROGRESS NOTE    Deborah AARON  [name]:[id number] DOB: 1965/10/02 DOA: 07/20/2020 PCP: Cleophas Dunker, DO   Chief Complain: Nausea, vomiting, abdominal pain  Brief Narrative: Patient is a 55 year old female with history of diabetes type 2, hypertension, gastroparesis, CKD stage IIIb who presents to the emergency room with complaint of nausea, vomiting, abdominal discomfort.  She was found to be in DKA and was started on DKA protocol.  Lab work also showed AKI on her CKD on presentation.  Currently on sliding scale and Lantus.  Diabetic coordinator following.  Hemoglobin A1c of more than 10.  Hospital course remarkable for persistent abdominal pain, anemia with drop of hemorrhage into the range of 6 needing PRBC transfusion.  GI consulted.  Plan for EGD tomorrow.  Assessment & Plan:   Principal Problem:   DKA (diabetic ketoacidosis) ([place]) Active Problems:   Essential hypertension, benign   CKD (chronic kidney disease), stage II   Chest pain   ARF (acute renal failure) ([place])   DKA: Patient states she is compliant with her medications.  Hemoglobin A1c of more than 10.  Diabetic coordinator  following.  Started on DKA protocol.  Gap has closed, currently she is on Lantus and sliding scale.    Abdominal pain/diabetic gastroparesis: Presented with nausea, vomiting, abdominal pain.  Cannot tolerate Reglan due to history of tardive dyskinesia.  Continue Zofran, Phenergan.  Continue IV fluids.  Patient will be instructed to take small-volume frequent nonfatty meals. She has history of low  Grade  carcinoid tumor of duodenum, status post resection.  She had persistent abdominal discomfort now improving ,follows with oncology. Abdominal CT/pelvis without contrast did not show any acute intra-abdominal findings. She has been advised to limit IV pain medication for her abdominal pain   AKI on CKD stage IV: Creatinine in the range of 4 on presentation most likely precipitated due to  dehydration from nausea/vomiting.  Continue IV fluids.Her baseline creatinine is around 2. She follows with Dr Darlin Coco.  Hypertension:   Takes hydralazine, Imdur, metoprolol at home.  Increased the dose of hydralazine,metoprolol.  Paroxysmal A. fib:   On Eliquis for anticoagulation.  On metoprolol for rate control.  Monitor in telemetry  Normocytic anemia: Likely chronic and associated with chronic kidney disease.  Hemoglobin dropped to 6.7 .  She denies any change in the color of the stool.  She is on Eliquis for A. fib.  Positive  FOBT.  Normal iron as per  iron studies.  She was transfused with a unit of PRBC , hemoglobin in the range of 8 today.  GI planning for EGD tomorrow.  Continue Protonix  Leukocytosis: Most likely reactive.Afebrile.  UA was not impressive for UTI.  Does not have any respiratory symptoms/low suspicion for pneumonia.  Continue to monitor            DVT prophylaxis:Eliquis Code Status: Full Family Communication: None at bedside Status is: Inpatient    Dispo: The patient is from: Home              Anticipated d/c is to: Home              Patient currently is not medically stable to d/c.   Difficult to place patient No    Consultants: None  Procedures:None  Antimicrobials:  Anti-infectives (From admission, onward)   None      Subjective:   Patient seen and examined the bedside this morning.  Hemodynamically stable.  Blood pressure is better today.  Abdomen  pain, nausea and vomiting better.  She was requesting to continue IV Dilaudid for abdominal pain.  Objective: Vitals:   07/24/20 1350 07/24/20 1731 07/24/20 2032 07/25/20 0625  BP: 133/83 131/79 116/75 (!) 147/89  Pulse: 92 89 91 95  Resp: 16  16 13   Temp: 98.3 F (36.8 C)  98 F (36.7 C) 97.7 F (36.5 C)  TempSrc: Oral  Oral Oral  SpO2: 100%  99% 100%  Weight:      Height:        Intake/Output Summary (Last 24 hours) at 07/25/2020 1250 Last data filed at 07/25/2020  1130 Gross per 24 hour  Intake 2181.16 ml  Output 450 ml  Net 1731.16 ml   Filed Weights   07/20/20 2256 07/23/20 2024  Weight: 135.6 kg 136 kg    Examination:  General exam: Overall comfortable, not in distress,morbidly obese HEENT: PERRL, tardive dyskinesia Respiratory system:  no wheezes or crackles  Cardiovascular system: S1 & S2 heard, RRR.  Gastrointestinal system: Abdomen is nondistended, soft and nontender. Central nervous system: Alert and oriented Extremities: No edema, no clubbing ,no cyanosis Skin: No rashes, no ulcers,no icterus      Data Reviewed: I have personally reviewed following labs and imaging studies  CBC: Recent Labs  Lab 07/20/20 0100 07/22/20 0301 07/23/20 0253 07/24/20 0512 07/24/20 1611 07/25/20 0457  WBC 19.3* 16.1* 12.0* 14.2*  --  15.0*  NEUTROABS  --  12.7* 7.2 9.9*  --  11.1*  HGB 8.3* 7.8* 7.2* 6.7* 9.1* 8.6*  HCT 25.8* 25.1* 24.0* 21.9* 29.3* 27.7*  MCV 88.1 90.3 93.4 90.1  --  91.7  PLT 570* 492* 437* 445*  --  604*   Basic Metabolic Panel: Recent Labs  Lab 07/21/20 1546 07/22/20 0301 07/23/20 0253 07/24/20 0512 07/25/20 0457  NA 136 137 136 137 137  K 3.4* 3.5 3.4* 3.3* 3.8  CL 102 102 103 103 106  CO2 23 23 25 27 24   GLUCOSE 215* 338* 223* 135* 184*  BUN 61* 51* 46* 38* 39*  CREATININE 3.32* 2.74* 3.26* 2.64* 3.12*  CALCIUM 8.7* 8.5* 8.1* 8.0* 8.0*   GFR: Estimated Creatinine Clearance: 28.6 mL/min (A) (by C-G formula based on SCr of 3.12 mg/dL (H)). Liver Function Tests: Recent Labs  Lab 07/20/20 0100  AST 14*  ALT 8  ALKPHOS 99  BILITOT 1.3*  PROT 8.2*  ALBUMIN 3.2*   Recent Labs  Lab 07/20/20 0100  LIPASE 24   No results for input(s): AMMONIA in the last 168 hours. Coagulation Profile: No results for input(s): INR, PROTIME in the last 168 hours. Cardiac Enzymes: No results for input(s): CKTOTAL, CKMB, CKMBINDEX, TROPONINI in the last 168 hours. BNP (last 3 results) No results for input(s): PROBNP  in the last 8760 hours. HbA1C: No results for input(s): HGBA1C in the last 72 hours. CBG: Recent Labs  Lab 07/24/20 1122 07/24/20 1701 07/24/20 2035 07/25/20 0759 07/25/20 1112  GLUCAP 288* 234* 228* 167* 215*   Lipid Profile: No results for input(s): CHOL, HDL, LDLCALC, TRIG, CHOLHDL, LDLDIRECT in the last 72 hours. Thyroid Function Tests: No results for input(s): TSH, T4TOTAL, FREET4, T3FREE, THYROIDAB in the last 72 hours. Anemia Panel: Recent Labs    07/23/20 0919  FERRITIN 1,032*  TIBC 381  IRON 92   Sepsis Labs: Recent Labs  Lab 07/22/20 1258  LATICACIDVEN 1.6    Recent Results (from the past 240 hour(s))  Resp Panel by RT-PCR (Flu A&B, Covid) Urine, Clean Catch  Status: None   Collection Time: 07/21/20  4:55 AM   Specimen: Urine, Clean Catch; Nasopharyngeal(NP) swabs in vial transport medium  Result Value Ref Range Status   SARS Coronavirus 2 by RT PCR NEGATIVE NEGATIVE Final    Comment: (NOTE) SARS-CoV-2 target nucleic acids are NOT DETECTED.  The SARS-CoV-2 RNA is generally detectable in upper respiratory specimens during the acute phase of infection. The lowest concentration of SARS-CoV-2 viral copies this assay can detect is 138 copies/mL. A negative result does not preclude SARS-Cov-2 infection and should not be used as the sole basis for treatment or other patient management decisions. A negative result may occur with  improper specimen collection/handling, submission of specimen other than nasopharyngeal swab, presence of viral mutation(s) within the areas targeted by this assay, and inadequate number of viral copies(<138 copies/mL). A negative result must be combined with clinical observations, patient history, and epidemiological information. The expected result is Negative.  Fact Sheet for Patients:  EntrepreneurPulse.com.au  Fact Sheet for Healthcare Providers:  IncredibleEmployment.be  This test is no  t yet approved or cleared by the [place] FDA and  has been authorized for detection and/or diagnosis of SARS-CoV-2 by FDA under an Emergency Use Authorization (EUA). This EUA will remain  in effect (meaning this test can be used) for the duration of the COVID-19 declaration under Section 564(b)(1) of the Act, 21 U.S.C.section 360bbb-3(b)(1), unless the authorization is terminated  or revoked sooner.       Influenza A by PCR NEGATIVE NEGATIVE Final   Influenza B by PCR NEGATIVE NEGATIVE Final    Comment: (NOTE) The Xpert Xpress SARS-CoV-2/FLU/RSV plus assay is intended as an aid in the diagnosis of influenza from Nasopharyngeal swab specimens and should not be used as a sole basis for treatment. Nasal washings and aspirates are unacceptable for Xpert Xpress SARS-CoV-2/FLU/RSV testing.  Fact Sheet for Patients: EntrepreneurPulse.com.au  Fact Sheet for Healthcare Providers: IncredibleEmployment.be  This test is not yet approved or cleared by the [place] FDA and has been authorized for detection and/or diagnosis of SARS-CoV-2 by FDA under an Emergency Use Authorization (EUA). This EUA will remain in effect (meaning this test can be used) for the duration of the COVID-19 declaration under Section 564(b)(1) of the Act, 21 U.S.C. section 360bbb-3(b)(1), unless the authorization is terminated or revoked.  Performed at [facility], [place] [address], [place], [place] [address]   MRSA PCR Screening     Status: Abnormal   Collection Time: 07/21/20  6:16 AM   Specimen: Nasopharyngeal  Result Value Ref Range Status   MRSA by PCR POSITIVE (A) NEGATIVE Final    Comment:        The GeneXpert MRSA Assay (FDA approved for NASAL specimens only), is one component of a comprehensive MRSA colonization surveillance program. It is not intended to diagnose MRSA infection nor to guide or monitor treatment for MRSA  infections. RESULT CALLED TO, READ BACK BY AND VERIFIED WITH: Avon Gully RN AT [phone] ON 07/21/20 BY Buel Ream Performed at [facility], [facility], [place] [address], [place], [place] [address]          Radiology Studies: DG CHEST PORT 1 VIEW  Result Date: 07/23/2020 CLINICAL DATA:  Chest pain EXAM: PORTABLE CHEST 1 VIEW COMPARISON:  07/20/2020 FINDINGS: Cardiac shadow is mildly prominent but stable. Slight increased vascular congestion is noted without interstitial edema. No bony abnormality is noted. IMPRESSION: Mild increased vascular congestion.  No edema is noted. Electronically Signed   By:  Inez Catalina M.D.   On: 07/23/2020 21:49        Scheduled Meds: . atorvastatin  10 mg Oral Daily  . DULoxetine  40 mg Oral Daily  . hydrALAZINE  50 mg Oral TID  . insulin aspart  0-20 Units Subcutaneous TID WC  . insulin aspart  0-5 Units Subcutaneous QHS  . insulin aspart  8 Units Subcutaneous TID WC  . insulin glargine  25 Units Subcutaneous BID  . isosorbide mononitrate  60 mg Oral Daily  . metoprolol succinate  50 mg Oral Daily  . mupirocin ointment  1 application Nasal BID  . pantoprazole (PROTONIX) IV  40 mg Intravenous Q12H  . senna  1 tablet Oral QHS   Continuous Infusions: . sodium chloride 100 mL/hr at 07/25/20 0913  . promethazine (PHENERGAN) injection (IM or IVPB) Stopped (07/21/20 1531)     LOS: 3 days    Time spent: 35 mins.More than 50% of that time was spent in counseling and/or coordination of care.      Shelly Coss, MD [facility] P5/20/2022, 12:50 PM

## 2020-07-25 NOTE — Progress Notes (Signed)
Inpatient Diabetes Program Recommendations  AACE/ADA: New Consensus Statement on Inpatient Glycemic Control (2015)  Target Ranges:  Prepandial:   less than 140 mg/dL      Peak postprandial:   less than 180 mg/dL (1-2 hours)      Critically ill patients:  140 - 180 mg/dL   Results for GLORIS, SHIROMA (MRN [id number]) as of 07/25/2020 06:51  Ref. Range 07/24/2020 07:43 07/24/2020 11:22 07/24/2020 17:01 07/24/2020 20:35  Glucose-Capillary Latest Ref Range: 70 - 99 mg/dL 117 (H) 288 (H)  19 units NOVOLOG  25 units LANTUS  234 (H)  15 units NOVOLOG  228 (H)  2 units NOVOLOG  25 units LANTUS   Results for CEAIRRA, MCCARVER (MRN [id number]) as of 07/25/2020 12:20  Ref. Range 07/25/2020 07:59 07/25/2020 11:12  Glucose-Capillary Latest Ref Range: 70 - 99 mg/dL 167 (H)  4 units NOVOLOG  25 units LANTUS 215 (H)   Results for Deborah [name], Deborah [name] (MRN [id number]) as of 07/25/2020 06:51  Ref. Range 03/29/2020 13:51 07/21/2020 07:07  Hemoglobin A1C Latest Ref Range: 4.8 - 5.6 % 8.0 (H) 10.5 (H)  (254 mg/dl)    Admit with: DKA  History: DM  Home DM Meds: Lantus 40 units QHS          Farxiga 10 mg Daily       Novolog 30 units TID  Current Orders: Lantus 25 units BID      Novolog Resistant Correction Scale/ SSI (0-20 units) TID AC + HS      Novolog 8 units TID with meals    MD- Note Still having afternoon CBGs >200.  Note pt will be NPO tomorrow AM for EGD  Please consider increasing Novolog Meal Coverage to 10 units TID with meals    Addendum 12:15pm--Met w/ pt at bedside to discuss current A1c of 10.5%.  Pt stated she has CBG meter and checks her CBGs and takes insulin at home as prescribed.  Has access to meds.  Sees MD with [facility] location for primary care.  Verified home meds for diabetes (see above list).  Did not have any questions for me about her blood sugars or inpatient insulin regimen.  Question adherence to insulin at home given her A1c is 10.5% which is  up from 8% back in January 2022.    --Will follow patient during hospitalization--  Wyn Quaker RN, MSN, CDE Diabetes Coordinator Inpatient Glycemic Control Team Team Pager: [phone] (8a-5p)

## 2020-07-25 NOTE — Care Management Important Message (Signed)
Important Message  Patient Details IM Letter given to the Patient. Name: Deborah [name] MRN: [id number] Date of Birth: 20-Jul-1965   Medicare Important Message Given:  Yes     Kerin Salen 07/25/2020, 9:12 AM

## 2020-07-25 NOTE — H&P (View-Only) (Signed)
Referring Provider: [facility] Primary Care Physician:  Cleophas Dunker, DO Primary Gastroenterologist:  Dr. Alessandra Bevels [facility])  Reason for Consultation:  Nausea/vomiting, anemia  HPI: Deborah [name] is a 55 y.o. female type 2, hypertension, gastroparesis, CKD stage IIIb, A fib on Eliquis, duodenal carcinoid tumor presenting for consultation of nausea, vomiting, and abdominal pain.  Patient presented to the ED on 5/16 with nausea, vomiting, and abdominal pain and was found to be in DKA.  She reports recent worsening of nausea/vomiting and has noted black emesis on occasion.  She has not vomited today and wants to advance her diet.  She reports generalized abdominal pain, improving.  Denies melena or hematochezia.  Reports poor appetite but denies unexplained weight loss.  Denies NSAID, ASA, or blood thinner use.    She was previously on erythromycin for gastroparesis, but she states she is no longer taking it as her PCP stopped it.  She underwent EGD and colonoscopy in May 2021. EGD showed a polyp in the duodenal bulb. Biopsy showed carcinoid tumor. Underwent repeat EGD on Aug 02, 2019 with removal of duodenal polyp with hot snare. One clip was placed. Pathology revealed low-grade carcinoid with lymphovascular invasion. She is currently followed by oncology. Had a PET scan on September 24, 2019 which showed no evidence of metastatic disease.       Colonoscopy Jul 30, 2019 showed fair prep, internal hemorrhoids, Limited to bleeding adenoma in the sigmoid colon as well as 2 small adenomas in the descending colon. Repeat colonoscopy recommended in 3 years.  Gastric emptying scan in May 2021 showed delayed gastric emptying.  Past Medical History:  Diagnosis Date  . Acute back pain with sciatica, left   . Acute back pain with sciatica, right   . AKI (acute kidney injury) ([place])   . Anemia, unspecified   . Cancer ([place])   . Carcinoid tumor of duodenum   . Chronic pain   . Chronic systolic CHF  (congestive heart failure) ([place])   . Diabetes mellitus   . Esophageal reflux   . Fibromyalgia   . Gastric ulcer   . Gastroparesis   . Gout   . Hyperlipidemia   . Hypertension   . Lumbosacral stenosis   . NICM (nonischemic cardiomyopathy) ([place])   . Obesity   . PAF (paroxysmal atrial fibrillation) ([place])   . Stroke ([place]) 02/2011  . Vitamin B12 deficiency anemia     Past Surgical History:  Procedure Laterality Date  . BIOPSY  07/27/2019   Procedure: BIOPSY;  Surgeon: Clarene Essex, MD;  Location: [facility];  Service: Endoscopy;;  . BIOPSY  07/30/2019   Procedure: BIOPSY;  Surgeon: Otis Brace, MD;  Location: [facility];  Service: Gastroenterology;;  . CATARACT EXTRACTION  01/2014  . CHOLECYSTECTOMY    . COLONOSCOPY WITH PROPOFOL N/A 07/30/2019   Procedure: COLONOSCOPY WITH PROPOFOL;  Surgeon: Otis Brace, MD;  Location: [facility];  Service: Gastroenterology;  Laterality: N/A;  . ESOPHAGOGASTRODUODENOSCOPY N/A 07/27/2019   Procedure: ESOPHAGOGASTRODUODENOSCOPY (EGD);  Surgeon: Clarene Essex, MD;  Location: [facility];  Service: Endoscopy;  Laterality: N/A;  . ESOPHAGOGASTRODUODENOSCOPY (EGD) WITH PROPOFOL N/A 08/02/2019   Procedure: ESOPHAGOGASTRODUODENOSCOPY (EGD) WITH PROPOFOL;  Surgeon: Otis Brace, MD;  Location: [facility];  Service: Gastroenterology;  Laterality: N/A;  . HEMOSTASIS CLIP PLACEMENT  08/02/2019   Procedure: HEMOSTASIS CLIP PLACEMENT;  Surgeon: Otis Brace, MD;  Location: [facility];  Service: Gastroenterology;;  . POLYPECTOMY  07/30/2019   Procedure: POLYPECTOMY;  Surgeon: Otis Brace, MD;  Location:  [facility];  Service: Gastroenterology;;  . POLYPECTOMY  08/02/2019   Procedure: POLYPECTOMY;  Surgeon: Otis Brace, MD;  Location: [facility];  Service: Gastroenterology;;    Prior to Admission medications   Medication Sig Start Date End Date Taking? Authorizing Provider  allopurinol (ZYLOPRIM) 100 MG tablet Take 100 mg by  mouth daily.   Yes [provider]  atorvastatin (LIPITOR) 10 MG tablet TAKE ONE TABLET BY MOUTH ONCE DAILY (AM) Patient taking differently: Take 10 mg by mouth daily. 11/14/19  Yes Meccariello, Bernita Raisin, DO  DULoxetine HCl 40 MG CPEP TAKE ONE CAPSULE BY MOUTH TWICE DAILY Patient taking differently: Take 40 mg by mouth in the morning and at bedtime. 05/06/20  Yes Meccariello, Bernita Raisin, DO  ELIQUIS 5 MG TABS tablet TAKE 1 TABLET BY MOUTH 2 (TWO) TIMES DAILY. Patient taking differently: Take 5 mg by mouth 2 (two) times daily. 12/20/19  Yes Meccariello, Bernita Raisin, DO  FARXIGA 10 MG TABS tablet Take 10 mg by mouth daily. 10/02/19  Yes [provider]  fluticasone (FLONASE) 50 MCG/ACT nasal spray Place 2 sprays into both nostrils daily as needed for allergies or rhinitis. 12/19/18  Yes Rai, Ripudeep K, MD  hydrALAZINE (APRESOLINE) 25 MG tablet Take 1 tablet (25 mg total) by mouth 3 (three) times daily. 05/07/20  Yes Meccariello, Bernita Raisin, DO  HYDROmorphone (DILAUDID) 2 MG tablet Take 1 tablet (2 mg total) by mouth daily as needed for severe pain. 07/01/20  Yes Raulkar, Clide Deutscher, MD  isosorbide mononitrate (IMDUR) 60 MG 24 hr tablet TAKE 1 TABLET (60 MG TOTAL) BY MOUTH DAILY. 07/01/20  Yes Meccariello, Bernita Raisin, DO  LANTUS SOLOSTAR 100 UNIT/ML Solostar Pen Inject 32 Units into the skin at bedtime. Patient taking differently: Inject 40 Units into the skin at bedtime. 01/28/20 02/29/20 Yes Sheikh, Omair Latif, DO  metoprolol succinate (TOPROL-XL) 25 MG 24 hr tablet Take 1 tablet (25 mg total) by mouth daily. 02/04/20  Yes Meccariello, Bernita Raisin, DO  nitroGLYCERIN (NITROSTAT) 0.4 MG SL tablet Place 1 tablet (0.4 mg total) under the tongue every 5 (five) minutes as needed for chest pain. 12/13/18  Yes Lockamy, Timothy, DO  NOVOLOG FLEXPEN 100 UNIT/ML FlexPen Inject 30 Units into the skin in the morning, at noon, in the evening, and at bedtime. 02/04/20  Yes Meccariello, Bernita Raisin, DO  ondansetron  (ZOFRAN) 4 MG tablet Take 1 tablet (4 mg total) by mouth every 6 (six) hours as needed for nausea. 07/01/20  Yes Raulkar, Clide Deutscher, MD  pantoprazole (PROTONIX) 40 MG tablet TAKE ONE TABLET BY MOUTH TWICE DAILY Patient taking differently: Take 40 mg by mouth 2 (two) times daily. 09/04/19  Yes Lockamy, Timothy, DO  albuterol (PROVENTIL) (2.5 MG/3ML) 0.083% nebulizer solution Take 3 mLs (2.5 mg total) by nebulization every 6 (six) hours as needed for wheezing or shortness of breath. Patient not taking: Reported on 07/21/2020 04/06/19   Scot Jun, FNP  ALPRAZolam Duanne Moron) 1 MG tablet Take 1 tablet (1 mg total) by mouth 2 (two) times daily as needed for anxiety. Patient not taking: Reported on 07/21/2020 09/03/19   [name], DO  blood glucose meter kit and supplies KIT Dispense based on patient and insurance preference. Use up to four times daily as directed. (FOR ICD-9 250.00, 250.01). Patient taking differently: Inject 1 each into the skin See admin instructions. Dispense based on patient and insurance preference. Use up to four times daily as directed. (FOR ICD-9 250.00, 250.01). 12/13/18   Lockamy,  Timothy, DO  EASY COMFORT PEN NEEDLES 31G X 5 MM MISC USE 3 TIMES A DAY FOR INSULIN ADMINISTRATION Patient taking differently: 1 each by Other route 3 (three) times daily. 11/14/19   Meccariello, Bernita Raisin, DO  ferrous sulfate 325 (65 FE) MG tablet Take 1 tablet (325 mg total) by mouth daily with breakfast. Patient not taking: No sig reported 09/01/19 01/23/20  Alma Friendly, MD  fluconazole (DIFLUCAN) 50 MG tablet Take 1 tablet (50 mg total) by mouth daily. Patient not taking: No sig reported 05/05/20   Raulkar, Clide Deutscher, MD  hydrOXYzine (ATARAX/VISTARIL) 10 MG tablet Take 1 tablet (10 mg total) by mouth 3 (three) times daily as needed. Patient not taking: No sig reported 11/15/19   Meccariello, Bernita Raisin, DO  melatonin 3 MG TABS tablet Take 2 tablets (6 mg total) by mouth at bedtime. Patient  not taking: Reported on 07/21/2020 02/04/20   Meccariello, Bernita Raisin, DO  potassium chloride (KLOR-CON) 10 MEQ tablet Take 1 tablet (10 mEq total) by mouth daily for 5 days. Patient not taking: Reported on 07/21/2020 05/25/20 05/30/20  Eustaquio Maize, PA-C  torsemide (DEMADEX) 20 MG tablet Take 2 tablets (40 mg total) by mouth daily. Patient not taking: No sig reported 07/01/20   Meccariello, Bernita Raisin, DO    Scheduled Meds: . atorvastatin  10 mg Oral Daily  . DULoxetine  40 mg Oral Daily  . hydrALAZINE  50 mg Oral TID  . insulin aspart  0-20 Units Subcutaneous TID WC  . insulin aspart  0-5 Units Subcutaneous QHS  . insulin aspart  8 Units Subcutaneous TID WC  . insulin glargine  25 Units Subcutaneous BID  . isosorbide mononitrate  60 mg Oral Daily  . metoprolol succinate  50 mg Oral Daily  . mupirocin ointment  1 application Nasal BID  . pantoprazole (PROTONIX) IV  40 mg Intravenous Q12H  . senna  1 tablet Oral QHS   Continuous Infusions: . sodium chloride 100 mL/hr at 07/25/20 0913  . promethazine (PHENERGAN) injection (IM or IVPB) Stopped (07/21/20 1531)   PRN Meds:.ALPRAZolam, alum & mag hydroxide-simeth, dextrose, HYDROmorphone (DILAUDID) injection, labetalol, nitroGLYCERIN, ondansetron (ZOFRAN) IV, promethazine (PHENERGAN) injection (IM or IVPB)  Allergies as of 07/20/2020 - Review Complete 07/20/2020  Allergen Reaction Noted  . Diazepam [name] 09/21/2011  . Isovue [iopamidol] Anaphylaxis 11/28/2017  . Lisinopril Anaphylaxis 09/21/2011  . Metoclopramide Other (See Comments) 08/08/2019  . Penicillins Palpitations 02/04/2011  . Tolmetin Nausea Only and Other (See Comments) 09/21/2015  . Aspirin Other (See Comments) 09/26/2015  . Nsaids Other (See Comments) 09/21/2015  . Acetaminophen Nausea Only and Other (See Comments) 08/28/2015  . Tramadol Nausea And Vomiting 12/22/2015    Family History  Problem Relation Age of Onset  . Diabetes Mother   . Diabetes Father    . Heart disease Father   . Diabetes Sister   . Congestive Heart Failure Sister 50  . Diabetes Brother     Social History   Socioeconomic History  . Marital status: Single    Spouse name: Not on file  . Number of children: Not on file  . Years of education: Not on file  . Highest education level: Not on file  Occupational History  . Not on file  Tobacco Use  . Smoking status: Never Smoker  . Smokeless tobacco: Never Used  Vaping Use  . Vaping Use: Never used  Substance and Sexual Activity  . Alcohol use: No  . Drug use: No  .  Sexual activity: Not Currently    Birth control/protection: None  Other Topics Concern  . Not on file  Social History Narrative   ** Merged History Encounter **       Social Determinants of Health   Financial Resource Strain: Not on file  Food Insecurity: Not on file  Transportation Needs: Not on file  Physical Activity: Not on file  Stress: Not on file  Social Connections: Not on file  Intimate Partner Violence: Not on file    Review of Systems: Review of Systems  Constitutional: Negative for chills, fever and weight loss.  HENT: Negative for hearing loss and tinnitus.   Eyes: Negative for pain and redness.  Respiratory: Negative for cough and shortness of breath.   Cardiovascular: Negative for chest pain and palpitations.  Gastrointestinal: Positive for abdominal pain, nausea and vomiting. Negative for blood in stool, constipation, diarrhea, heartburn and melena.  Genitourinary: Negative for flank pain and hematuria.  Musculoskeletal: Negative for falls and joint pain.  Skin: Negative for itching and rash.  Neurological: Negative for seizures and loss of consciousness.  Endo/Heme/Allergies: Negative for polydipsia. Does not bruise/bleed easily.  Psychiatric/Behavioral: Negative for substance abuse. The patient is not nervous/anxious.      Physical Exam: Vital signs: Vitals:   07/24/20 2032 07/25/20 0625  BP: 116/75 (!) 147/89   Pulse: 91 95  Resp: 16 13  Temp: 98 F (36.7 C) 97.7 F (36.5 C)  SpO2: 99% 100%   Last BM Date: 07/24/20  Physical Exam Vitals reviewed.  Constitutional:      General: She is not in acute distress. HENT:     Head: Normocephalic and atraumatic.     Nose: Nose normal. No congestion.     Mouth/Throat:     Mouth: Mucous membranes are moist.     Pharynx: Oropharynx is clear.  Eyes:     General: No scleral icterus.    Extraocular Movements: Extraocular movements intact.     Conjunctiva/sclera: Conjunctivae normal.  Cardiovascular:     Rate and Rhythm: Normal rate and regular rhythm.  Pulmonary:     Effort: Pulmonary effort is normal.     Breath sounds: Normal breath sounds.  Abdominal:     General: Bowel sounds are normal. There is no distension.     Palpations: Abdomen is soft. There is no mass.     Tenderness: There is no abdominal tenderness. There is no guarding or rebound.     Hernia: No hernia is present.  Musculoskeletal:        General: No swelling or tenderness.     Cervical back: Normal range of motion and neck supple.  Skin:    General: Skin is warm and dry.  Neurological:     General: No focal deficit present.     Mental Status: She is alert and oriented to person, place, and time.  Psychiatric:        Mood and Affect: Mood normal.        Behavior: Behavior normal. Behavior is cooperative.     GI:  Lab Results: Recent Labs    07/23/20 0253 07/24/20 0512 07/24/20 1611 07/25/20 0457  WBC 12.0* 14.2*  --  15.0*  HGB 7.2* 6.7* 9.1* 8.6*  HCT 24.0* 21.9* 29.3* 27.7*  PLT 437* 445*  --  425*   BMET Recent Labs    07/23/20 0253 07/24/20 0512 07/25/20 0457  NA 136 137 137  K 3.4* 3.3* 3.8  CL 103 103 106  CO2 25  27 24  GLUCOSE 223* 135* 184*  BUN 46* 38* 39*  CREATININE 3.26* 2.64* 3.12*  CALCIUM 8.1* 8.0* 8.0*   LFT No results for input(s): PROT, ALBUMIN, AST, ALT, ALKPHOS, BILITOT, BILIDIR, IBILI in the last 72 hours. PT/INR No results  for input(s): LABPROT, INR in the last 72 hours.   Studies/Results: DG CHEST PORT 1 VIEW  Result Date: 07/23/2020 CLINICAL DATA:  Chest pain EXAM: PORTABLE CHEST 1 VIEW COMPARISON:  07/20/2020 FINDINGS: Cardiac shadow is mildly prominent but stable. Slight increased vascular congestion is noted without interstitial edema. No bony abnormality is noted. IMPRESSION: Mild increased vascular congestion.  No edema is noted. Electronically Signed   By: Inez Catalina M.D.   On: 07/23/2020 21:49    Impression/ Diabetic gastroparesis, nausea/vomiting and abdominal pain.  ?Coffee ground emesis  Anemia, Hgb 6.7 on 5/19, now improved to 8.6 after 1u pRBCs  History of duodenal carcinoid tumor  CKD: BUN 39/ Cr 3.12  A fib, On Eliquis, last dose 5/19  Plan: EGD tomorrow for further evaluation. I thoroughly discussed the procedure with the patient to include nature, alternatives, benefits, and risks (including but not limited to bleeding, infection, perforation, anesthesia/cardiac and pulmonary complications). Patient verbalized understanding and gave verbal consent to proceed with EGD.  If worsening nausea/vomiting or abdominal pain, start erythromycin250 mg 3 times daily with meals and bedtime.    Continue Phenergan 25 mg IV every 6 hours as needed for nausea and vomiting.  Continue Protonix BID.  Soft diet, NPO at midnight.  Blood glucose control.  Limit narcotic use, as this will worsen abdominal pain associated with gastroparesis.  [facility] will follow.   LOS: 3 days   Salley Slaughter  PA-C 07/25/2020, 9:26 AM  Contact #  [phone]

## 2020-07-25 NOTE — Consult Note (Addendum)
Referring Provider: [facility] Primary Care Physician:  Cleophas Dunker, DO Primary Gastroenterologist:  Dr. Alessandra Bevels [facility])  Reason for Consultation:  Nausea/vomiting, anemia  HPI: Deborah [name] is a 55 y.o. female type 2, hypertension, gastroparesis, CKD stage IIIb, A fib on Eliquis, duodenal carcinoid tumor presenting for consultation of nausea, vomiting, and abdominal pain.  Patient presented to the ED on 5/16 with nausea, vomiting, and abdominal pain and was found to be in DKA.  She reports recent worsening of nausea/vomiting and has noted black emesis on occasion.  She has not vomited today and wants to advance her diet.  She reports generalized abdominal pain, improving.  Denies melena or hematochezia.  Reports poor appetite but denies unexplained weight loss.  Denies NSAID, ASA, or blood thinner use.    She was previously on erythromycin for gastroparesis, but she states she is no longer taking it as her PCP stopped it.  She underwent EGD and colonoscopy in May 2021. EGD showed a polyp in the duodenal bulb. Biopsy showed carcinoid tumor. Underwent repeat EGD on Aug 02, 2019 with removal of duodenal polyp with hot snare. One clip was placed. Pathology revealed low-grade carcinoid with lymphovascular invasion. She is currently followed by oncology. Had a PET scan on September 24, 2019 which showed no evidence of metastatic disease.       Colonoscopy Jul 30, 2019 showed fair prep, internal hemorrhoids, Limited to bleeding adenoma in the sigmoid colon as well as 2 small adenomas in the descending colon. Repeat colonoscopy recommended in 3 years.  Gastric emptying scan in May 2021 showed delayed gastric emptying.  Past Medical History:  Diagnosis Date  . Acute back pain with sciatica, left   . Acute back pain with sciatica, right   . AKI (acute kidney injury) ([place])   . Anemia, unspecified   . Cancer ([place])   . Carcinoid tumor of duodenum   . Chronic pain   . Chronic systolic CHF  (congestive heart failure) ([place])   . Diabetes mellitus   . Esophageal reflux   . Fibromyalgia   . Gastric ulcer   . Gastroparesis   . Gout   . Hyperlipidemia   . Hypertension   . Lumbosacral stenosis   . NICM (nonischemic cardiomyopathy) ([place])   . Obesity   . PAF (paroxysmal atrial fibrillation) ([place])   . Stroke ([place]) 02/2011  . Vitamin B12 deficiency anemia     Past Surgical History:  Procedure Laterality Date  . BIOPSY  07/27/2019   Procedure: BIOPSY;  Surgeon: Clarene Essex, MD;  Location: [facility] ENDOSCOPY;  Service: Endoscopy;;  . BIOPSY  07/30/2019   Procedure: BIOPSY;  Surgeon: Otis Brace, MD;  Location: [facility]OSCOPY;  Service: Gastroenterology;;  . CATARACT EXTRACTION  01/2014  . CHOLECYSTECTOMY    . COLONOSCOPY WITH PROPOFOL N/A 07/30/2019   Procedure: COLONOSCOPY WITH PROPOFOL;  Surgeon: Otis Brace, MD;  Location: [facility]OSCOPY;  Service: Gastroenterology;  Laterality: N/A;  . ESOPHAGOGASTRODUODENOSCOPY N/A 07/27/2019   Procedure: ESOPHAGOGASTRODUODENOSCOPY (EGD);  Surgeon: Clarene Essex, MD;  Location: [facility];  Service: Endoscopy;  Laterality: N/A;  . ESOPHAGOGASTRODUODENOSCOPY (EGD) WITH PROPOFOL N/A 08/02/2019   Procedure: ESOPHAGOGASTRODUODENOSCOPY (EGD) WITH PROPOFOL;  Surgeon: Otis Brace, MD;  Location: [facility];  Service: Gastroenterology;  Laterality: N/A;  . HEMOSTASIS CLIP PLACEMENT  08/02/2019   Procedure: HEMOSTASIS CLIP PLACEMENT;  Surgeon: Otis Brace, MD;  Location: [facility];  Service: Gastroenterology;;  . POLYPECTOMY  07/30/2019   Procedure: POLYPECTOMY;  Surgeon: Otis Brace, MD;  Location:  [facility];  Service: Gastroenterology;;  . POLYPECTOMY  08/02/2019   Procedure: POLYPECTOMY;  Surgeon: Otis Brace, MD;  Location: [facility];  Service: Gastroenterology;;    Prior to Admission medications   Medication Sig Start Date End Date Taking? Authorizing Provider  allopurinol (ZYLOPRIM) 100 MG tablet Take 100 mg by  mouth daily.   Yes [provider]  atorvastatin (LIPITOR) 10 MG tablet TAKE ONE TABLET BY MOUTH ONCE DAILY (AM) Patient taking differently: Take 10 mg by mouth daily. 11/14/19  Yes Meccariello, Bernita Raisin, DO  DULoxetine HCl 40 MG CPEP TAKE ONE CAPSULE BY MOUTH TWICE DAILY Patient taking differently: Take 40 mg by mouth in the morning and at bedtime. 05/06/20  Yes Meccariello, Bernita Raisin, DO  ELIQUIS 5 MG TABS tablet TAKE 1 TABLET BY MOUTH 2 (TWO) TIMES DAILY. Patient taking differently: Take 5 mg by mouth 2 (two) times daily. 12/20/19  Yes Meccariello, Bernita Raisin, DO  FARXIGA 10 MG TABS tablet Take 10 mg by mouth daily. 10/02/19  Yes [provider]  fluticasone (FLONASE) 50 MCG/ACT nasal spray Place 2 sprays into both nostrils daily as needed for allergies or rhinitis. 12/19/18  Yes Rai, Ripudeep K, MD  hydrALAZINE (APRESOLINE) 25 MG tablet Take 1 tablet (25 mg total) by mouth 3 (three) times daily. 05/07/20  Yes Meccariello, Bernita Raisin, DO  HYDROmorphone (DILAUDID) 2 MG tablet Take 1 tablet (2 mg total) by mouth daily as needed for severe pain. 07/01/20  Yes Raulkar, Clide Deutscher, MD  isosorbide mononitrate (IMDUR) 60 MG 24 hr tablet TAKE 1 TABLET (60 MG TOTAL) BY MOUTH DAILY. 07/01/20  Yes Meccariello, Bernita Raisin, DO  LANTUS SOLOSTAR 100 UNIT/ML Solostar Pen Inject 32 Units into the skin at bedtime. Patient taking differently: Inject 40 Units into the skin at bedtime. 01/28/20 02/29/20 Yes Sheikh, Omair Latif, DO  metoprolol succinate (TOPROL-XL) 25 MG 24 hr tablet Take 1 tablet (25 mg total) by mouth daily. 02/04/20  Yes Meccariello, Bernita Raisin, DO  nitroGLYCERIN (NITROSTAT) 0.4 MG SL tablet Place 1 tablet (0.4 mg total) under the tongue every 5 (five) minutes as needed for chest pain. 12/13/18  Yes Lockamy, Timothy, DO  NOVOLOG FLEXPEN 100 UNIT/ML FlexPen Inject 30 Units into the skin in the morning, at noon, in the evening, and at bedtime. 02/04/20  Yes Meccariello, Bernita Raisin, DO  ondansetron  (ZOFRAN) 4 MG tablet Take 1 tablet (4 mg total) by mouth every 6 (six) hours as needed for nausea. 07/01/20  Yes Raulkar, Clide Deutscher, MD  pantoprazole (PROTONIX) 40 MG tablet TAKE ONE TABLET BY MOUTH TWICE DAILY Patient taking differently: Take 40 mg by mouth 2 (two) times daily. 09/04/19  Yes Lockamy, Timothy, DO  albuterol (PROVENTIL) (2.5 MG/3ML) 0.083% nebulizer solution Take 3 mLs (2.5 mg total) by nebulization every 6 (six) hours as needed for wheezing or shortness of breath. Patient not taking: Reported on 07/21/2020 04/06/19   Scot Jun, FNP  ALPRAZolam Duanne Moron) 1 MG tablet Take 1 tablet (1 mg total) by mouth 2 (two) times daily as needed for anxiety. Patient not taking: Reported on 07/21/2020 09/03/19   [name], DO  blood glucose meter kit and supplies KIT Dispense based on patient and insurance preference. Use up to four times daily as directed. (FOR ICD-9 250.00, 250.01). Patient taking differently: Inject 1 each into the skin See admin instructions. Dispense based on patient and insurance preference. Use up to four times daily as directed. (FOR ICD-9 250.00, 250.01). 12/13/18   Lockamy,  Timothy, DO  EASY COMFORT PEN NEEDLES 31G X 5 MM MISC USE 3 TIMES A DAY FOR INSULIN ADMINISTRATION Patient taking differently: 1 each by Other route 3 (three) times daily. 11/14/19   Meccariello, Bernita Raisin, DO  ferrous sulfate 325 (65 FE) MG tablet Take 1 tablet (325 mg total) by mouth daily with breakfast. Patient not taking: No sig reported 09/01/19 01/23/20  Alma Friendly, MD  fluconazole (DIFLUCAN) 50 MG tablet Take 1 tablet (50 mg total) by mouth daily. Patient not taking: No sig reported 05/05/20   Raulkar, Clide Deutscher, MD  hydrOXYzine (ATARAX/VISTARIL) 10 MG tablet Take 1 tablet (10 mg total) by mouth 3 (three) times daily as needed. Patient not taking: No sig reported 11/15/19   Meccariello, Bernita Raisin, DO  melatonin 3 MG TABS tablet Take 2 tablets (6 mg total) by mouth at bedtime. Patient  not taking: Reported on 07/21/2020 02/04/20   Meccariello, Bernita Raisin, DO  potassium chloride (KLOR-CON) 10 MEQ tablet Take 1 tablet (10 mEq total) by mouth daily for 5 days. Patient not taking: Reported on 07/21/2020 05/25/20 05/30/20  Eustaquio Maize, PA-C  torsemide (DEMADEX) 20 MG tablet Take 2 tablets (40 mg total) by mouth daily. Patient not taking: No sig reported 07/01/20   Meccariello, Bernita Raisin, DO    Scheduled Meds: . atorvastatin  10 mg Oral Daily  . DULoxetine  40 mg Oral Daily  . hydrALAZINE  50 mg Oral TID  . insulin aspart  0-20 Units Subcutaneous TID WC  . insulin aspart  0-5 Units Subcutaneous QHS  . insulin aspart  8 Units Subcutaneous TID WC  . insulin glargine  25 Units Subcutaneous BID  . isosorbide mononitrate  60 mg Oral Daily  . metoprolol succinate  50 mg Oral Daily  . mupirocin ointment  1 application Nasal BID  . pantoprazole (PROTONIX) IV  40 mg Intravenous Q12H  . senna  1 tablet Oral QHS   Continuous Infusions: . sodium chloride 100 mL/hr at 07/25/20 0913  . promethazine (PHENERGAN) injection (IM or IVPB) Stopped (07/21/20 1531)   PRN Meds:.ALPRAZolam, alum & mag hydroxide-simeth, dextrose, HYDROmorphone (DILAUDID) injection, labetalol, nitroGLYCERIN, ondansetron (ZOFRAN) IV, promethazine (PHENERGAN) injection (IM or IVPB)  Allergies as of 07/20/2020 - Review Complete 07/20/2020  Allergen Reaction Noted  . Diazepam [name] 09/21/2011  . Isovue [iopamidol] Anaphylaxis 11/28/2017  . Lisinopril Anaphylaxis 09/21/2011  . Metoclopramide Other (See Comments) 08/08/2019  . Penicillins Palpitations 02/04/2011  . Tolmetin Nausea Only and Other (See Comments) 09/21/2015  . Aspirin Other (See Comments) 09/26/2015  . Nsaids Other (See Comments) 09/21/2015  . Acetaminophen Nausea Only and Other (See Comments) 08/28/2015  . Tramadol Nausea And Vomiting 12/22/2015    Family History  Problem Relation Age of Onset  . Diabetes Mother   . Diabetes Father    . Heart disease Father   . Diabetes Sister   . Congestive Heart Failure Sister 50  . Diabetes Brother     Social History   Socioeconomic History  . Marital status: Single    Spouse name: Not on file  . Number of children: Not on file  . Years of education: Not on file  . Highest education level: Not on file  Occupational History  . Not on file  Tobacco Use  . Smoking status: Never Smoker  . Smokeless tobacco: Never Used  Vaping Use  . Vaping Use: Never used  Substance and Sexual Activity  . Alcohol use: No  . Drug use: No  .  Sexual activity: Not Currently    Birth control/protection: None  Other Topics Concern  . Not on file  Social History Narrative   ** Merged History Encounter **       Social Determinants of Health   Financial Resource Strain: Not on file  Food Insecurity: Not on file  Transportation Needs: Not on file  Physical Activity: Not on file  Stress: Not on file  Social Connections: Not on file  Intimate Partner Violence: Not on file    Review of Systems: Review of Systems  Constitutional: Negative for chills, fever and weight loss.  HENT: Negative for hearing loss and tinnitus.   Eyes: Negative for pain and redness.  Respiratory: Negative for cough and shortness of breath.   Cardiovascular: Negative for chest pain and palpitations.  Gastrointestinal: Positive for abdominal pain, nausea and vomiting. Negative for blood in stool, constipation, diarrhea, heartburn and melena.  Genitourinary: Negative for flank pain and hematuria.  Musculoskeletal: Negative for falls and joint pain.  Skin: Negative for itching and rash.  Neurological: Negative for seizures and loss of consciousness.  Endo/Heme/Allergies: Negative for polydipsia. Does not bruise/bleed easily.  Psychiatric/Behavioral: Negative for substance abuse. The patient is not nervous/anxious.      Physical Exam: Vital signs: Vitals:   07/24/20 2032 07/25/20 0625  BP: 116/75 (!) 147/89   Pulse: 91 95  Resp: 16 13  Temp: 98 F (36.7 C) 97.7 F (36.5 C)  SpO2: 99% 100%   Last BM Date: 07/24/20  Physical Exam Vitals reviewed.  Constitutional:      General: She is not in acute distress. HENT:     Head: Normocephalic and atraumatic.     Nose: Nose normal. No congestion.     Mouth/Throat:     Mouth: Mucous membranes are moist.     Pharynx: Oropharynx is clear.  Eyes:     General: No scleral icterus.    Extraocular Movements: Extraocular movements intact.     Conjunctiva/sclera: Conjunctivae normal.  Cardiovascular:     Rate and Rhythm: Normal rate and regular rhythm.  Pulmonary:     Effort: Pulmonary effort is normal.     Breath sounds: Normal breath sounds.  Abdominal:     General: Bowel sounds are normal. There is no distension.     Palpations: Abdomen is soft. There is no mass.     Tenderness: There is no abdominal tenderness. There is no guarding or rebound.     Hernia: No hernia is present.  Musculoskeletal:        General: No swelling or tenderness.     Cervical back: Normal range of motion and neck supple.  Skin:    General: Skin is warm and dry.  Neurological:     General: No focal deficit present.     Mental Status: She is alert and oriented to person, place, and time.  Psychiatric:        Mood and Affect: Mood normal.        Behavior: Behavior normal. Behavior is cooperative.     GI:  Lab Results: Recent Labs    07/23/20 0253 07/24/20 0512 07/24/20 1611 07/25/20 0457  WBC 12.0* 14.2*  --  15.0*  HGB 7.2* 6.7* 9.1* 8.6*  HCT 24.0* 21.9* 29.3* 27.7*  PLT 437* 445*  --  425*   BMET Recent Labs    07/23/20 0253 07/24/20 0512 07/25/20 0457  NA 136 137 137  K 3.4* 3.3* 3.8  CL 103 103 106  CO2 25  27 24  GLUCOSE 223* 135* 184*  BUN 46* 38* 39*  CREATININE 3.26* 2.64* 3.12*  CALCIUM 8.1* 8.0* 8.0*   LFT No results for input(s): PROT, ALBUMIN, AST, ALT, ALKPHOS, BILITOT, BILIDIR, IBILI in the last 72 hours. PT/INR No results  for input(s): LABPROT, INR in the last 72 hours.   Studies/Results: DG CHEST PORT 1 VIEW  Result Date: 07/23/2020 CLINICAL DATA:  Chest pain EXAM: PORTABLE CHEST 1 VIEW COMPARISON:  07/20/2020 FINDINGS: Cardiac shadow is mildly prominent but stable. Slight increased vascular congestion is noted without interstitial edema. No bony abnormality is noted. IMPRESSION: Mild increased vascular congestion.  No edema is noted. Electronically Signed   By: Inez Catalina M.D.   On: 07/23/2020 21:49    Impression/ Diabetic gastroparesis, nausea/vomiting and abdominal pain.  ?Coffee ground emesis  Anemia, Hgb 6.7 on 5/19, now improved to 8.6 after 1u pRBCs  History of duodenal carcinoid tumor  CKD: BUN 39/ Cr 3.12  A fib, On Eliquis, last dose 5/19  Plan: EGD tomorrow for further evaluation. I thoroughly discussed the procedure with the patient to include nature, alternatives, benefits, and risks (including but not limited to bleeding, infection, perforation, anesthesia/cardiac and pulmonary complications). Patient verbalized understanding and gave verbal consent to proceed with EGD.  If worsening nausea/vomiting or abdominal pain, start erythromycin250 mg 3 times daily with meals and bedtime.    Continue Phenergan 25 mg IV every 6 hours as needed for nausea and vomiting.  Continue Protonix BID.  Soft diet, NPO at midnight.  Blood glucose control.  Limit narcotic use, as this will worsen abdominal pain associated with gastroparesis.  [facility] will follow.   LOS: 3 days   Salley Slaughter  PA-C 07/25/2020, 9:26 AM  Contact #  [phone]

## 2020-07-26 ENCOUNTER — Encounter (HOSPITAL_COMMUNITY)
Admission: EM | Disposition: A | Discharge status: Home / Self Care | Payer: Self-pay | Source: Home / Self Care | Attending: Internal Medicine

## 2020-07-26 ENCOUNTER — Inpatient Hospital Stay (HOSPITAL_COMMUNITY): Payer: Medicare Other | Admitting: Certified Registered Nurse Anesthetist

## 2020-07-26 ENCOUNTER — Encounter (HOSPITAL_COMMUNITY): Payer: Self-pay | Admitting: Internal Medicine

## 2020-07-26 DIAGNOSIS — E0810 Diabetes mellitus due to underlying condition with ketoacidosis without coma: Secondary | ICD-10-CM | POA: Diagnosis not present

## 2020-07-26 HISTORY — PX: ESOPHAGOGASTRODUODENOSCOPY: SHX5428

## 2020-07-26 LAB — GLUCOSE, CAPILLARY
Glucose-Capillary: 245 mg/dL — ABNORMAL HIGH (ref 70–99)
Glucose-Capillary: 238 mg/dL — ABNORMAL HIGH (ref 70–99)
Glucose-Capillary: 339 mg/dL — ABNORMAL HIGH (ref 70–99)
Glucose-Capillary: 260 mg/dL — ABNORMAL HIGH (ref 70–99)

## 2020-07-26 LAB — CBC WITH DIFFERENTIAL/PLATELET
Abs Immature Granulocytes: 0.09 10*3/uL — ABNORMAL HIGH (ref 0.00–0.07)
Basophils Absolute: 0.1 10*3/uL (ref 0.0–0.1)
Basophils Relative: 1 %
Eosinophils Absolute: 0.5 10*3/uL (ref 0.0–0.5)
Eosinophils Relative: 3 %
HCT: 28.7 % — ABNORMAL LOW (ref 36.0–46.0)
Hemoglobin: 8.7 g/dL — ABNORMAL LOW (ref 12.0–15.0)
Immature Granulocytes: 1 %
Lymphocytes Relative: 20 %
Lymphs Abs: 3 10*3/uL (ref 0.7–4.0)
MCH: 27.9 pg (ref 26.0–34.0)
MCHC: 30.3 g/dL (ref 30.0–36.0)
MCV: 92 fL (ref 80.0–100.0)
Monocytes Absolute: 0.7 10*3/uL (ref 0.1–1.0)
Monocytes Relative: 5 %
Neutro Abs: 10.2 10*3/uL — ABNORMAL HIGH (ref 1.7–7.7)
Neutrophils Relative %: 70 %
Platelets: 427 10*3/uL — ABNORMAL HIGH (ref 150–400)
RBC: 3.12 MIL/uL — ABNORMAL LOW (ref 3.87–5.11)
RDW: 15.2 % (ref 11.5–15.5)
WBC: 14.5 10*3/uL — ABNORMAL HIGH (ref 4.0–10.5)
nRBC: 0 % (ref 0.0–0.2)

## 2020-07-26 LAB — BASIC METABOLIC PANEL
Anion gap: 9 (ref 5–15)
BUN: 38 mg/dL — ABNORMAL HIGH (ref 6–20)
CO2: 22 mmol/L (ref 22–32)
Calcium: 7.9 mg/dL — ABNORMAL LOW (ref 8.9–10.3)
Chloride: 104 mmol/L (ref 98–111)
Creatinine, Ser: 2.71 mg/dL — ABNORMAL HIGH (ref 0.44–1.00)
GFR, Estimated: 20 mL/min — ABNORMAL LOW (ref >60)
Glucose, Bld: 231 mg/dL — ABNORMAL HIGH (ref 70–99)
Potassium: 3.5 mmol/L (ref 3.5–5.1)
Sodium: 135 mmol/L (ref 135–145)

## 2020-07-26 SURGERY — EGD (ESOPHAGOGASTRODUODENOSCOPY)
Anesthesia: Monitor Anesthesia Care

## 2020-07-26 MED ORDER — SODIUM CHLORIDE 0.9 % IV SOLN: INTRAVENOUS | Status: DC

## 2020-07-26 MED ORDER — LACTATED RINGERS IV SOLN
INTRAVENOUS | Status: DC
  Administered 2020-07-26: 1000 mL via INTRAVENOUS

## 2020-07-26 MED ORDER — APIXABAN 5 MG PO TABS
5.0000 mg | ORAL_TABLET | Freq: Two times a day (BID) | ORAL | Status: DC
  Administered 2020-07-26 – 2020-07-27 (×2): 5 mg via ORAL
  Filled 2020-07-26 (×2): qty 1

## 2020-07-26 MED ORDER — FUROSEMIDE 10 MG/ML IJ SOLN
80.0000 mg | Freq: Two times a day (BID) | INTRAMUSCULAR | Status: DC
  Administered 2020-07-26 – 2020-07-27 (×3): 80 mg via INTRAVENOUS
  Filled 2020-07-26 (×4): qty 8

## 2020-07-26 MED ORDER — PANTOPRAZOLE SODIUM 40 MG PO TBEC
40.0000 mg | DELAYED_RELEASE_TABLET | Freq: Every day | ORAL | Status: DC
  Administered 2020-07-27: 40 mg via ORAL
  Filled 2020-07-26 (×2): qty 1

## 2020-07-26 MED ORDER — PROPOFOL 500 MG/50ML IV EMUL
INTRAVENOUS | Status: DC | PRN
  Administered 2020-07-26: 150 ug/kg/min via INTRAVENOUS

## 2020-07-26 NOTE — Anesthesia Preprocedure Evaluation (Addendum)
Anesthesia Evaluation  Patient identified by MRN, date of birth, ID band Patient awake    Reviewed: Allergy & Precautions, NPO status , Patient's Chart, lab work & pertinent test results, reviewed documented beta blocker date and time   History of Anesthesia Complications Negative for: history of anesthetic complications  Airway Mallampati: II  TM Distance: >3 FB Neck ROM: Full    Dental  (+) Missing, Dental Advisory Given   Pulmonary COPD,  07/21/2020 SARS coronavirus NEG   breath sounds clear to auscultation       Cardiovascular hypertension, Pt. on medications and Pt. on home beta blockers (-) angina+ dysrhythmias Atrial Fibrillation  Rhythm:Regular Rate:Normal  05/2020 ECHO: mild concentric LV systolic function is low normal, borderline global hypokinesis of the left ventricle.  LV EF = 50-55%. No significant valvular abnormalities  '19 cath: normal coronaries   Neuro/Psych Anxiety Depression CVA, No Residual Symptoms    GI/Hepatic PUD, GERD  Medicated and Controlled,No n/v for 2 days, NPO   Endo/Other  diabetes (glu 245), Insulin DependentMorbid obesity  Renal/GU Renal Insufficiency and ARFRenal disease (creat 3.12)     Musculoskeletal  (+) Fibromyalgia -  Abdominal (+) + obese,   Peds  Hematology  (+) Blood dyscrasia (Hb 8.6), anemia , eliquis   Anesthesia Other Findings   Reproductive/Obstetrics                            Anesthesia Physical Anesthesia Plan  ASA: III  Anesthesia Plan: MAC   Post-op Pain Management:    Induction:   PONV Risk Score and Plan: 2 and Ondansetron and Treatment may vary due to age or medical condition  Airway Management Planned: Natural Airway and Nasal Cannula  Additional Equipment: None  Intra-op Plan:   Post-operative Plan:   Informed Consent: I have reviewed the patients History and Physical, chart, labs and discussed the procedure  including the risks, benefits and alternatives for the proposed anesthesia with the patient or authorized representative who has indicated his/her understanding and acceptance.     Dental advisory given  Plan Discussed with: CRNA and Surgeon  Anesthesia Plan Comments:        Anesthesia Quick Evaluation

## 2020-07-26 NOTE — Transfer of Care (Signed)
Immediate Anesthesia Transfer of Care Note  Patient: Deborah [name]  Procedure(s) Performed: ESOPHAGOGASTRODUODENOSCOPY (EGD) (N/A )  Patient Location: PACU  Anesthesia Type:MAC  Level of Consciousness: sedated, patient cooperative and responds to stimulation  Airway & Oxygen Therapy: Patient Spontanous Breathing and Patient connected to face mask oxygen  Post-op Assessment: Report given to RN and Post -op Vital signs reviewed and stable  Post vital signs: Reviewed and stable  Last Vitals:  Vitals Value Taken Time  BP    Temp    Pulse    Resp    SpO2      Last Pain:  Vitals:   07/26/20 0913  TempSrc: Oral  PainSc: 8       Patients Stated Pain Goal: 0 (36/46/80 3212)  Complications: No complications documented.

## 2020-07-26 NOTE — Op Note (Signed)
[facility] Patient Name: Deborah [name] Procedure Date: 07/26/2020 MRN: [id number] Attending MD: Arta Silence , MD Date of Birth: [birth year] [name]: [id number] Age: 55 Admit Type: Inpatient Procedure:                Upper GI endoscopy Indications:              Acute post hemorrhagic anemia, Coffee-ground                            emesis, Hematemesis Providers:                Arta Silence, MD, Janee Morn, Technician Referring MD:             [facility]. Medicines:                Monitored Anesthesia Care Complications:            No immediate complications. Estimated Blood Loss:     Estimated blood loss: none. Procedure:                Pre-Anesthesia Assessment:                           - Prior to the procedure, a History and Physical                            was performed, and patient medications and                            allergies were reviewed. The patient's tolerance of                            previous anesthesia was also reviewed. The risks                            and benefits of the procedure and the sedation                            options and risks were discussed with the patient.                            All questions were answered, and informed consent                            was obtained. Prior Anticoagulants: The patient has                            taken Eliquis (apixaban), last dose was 2 days                            prior to procedure. ASA Grade Assessment: III - A                            patient with severe systemic disease. After  reviewing the risks and benefits, the patient was                            deemed in satisfactory condition to undergo the                            procedure.                           After obtaining informed consent, the endoscope was                            passed under direct vision. Throughout the                            procedure, the  patient's blood pressure, pulse, and                            oxygen saturations were monitored continuously. The                            GIF-H190 ([id number]) Olympus gastroscope was                            introduced through the mouth, and advanced to the                            duodenal bulb. The upper GI endoscopy was                            accomplished without difficulty. The patient                            tolerated the procedure well. Scope In: Scope Out: 9:34:43 AM Findings:      LA Grade A (one or more mucosal breaks less than 5 mm, not extending       between tops of 2 mucosal folds) esophagitis with no bleeding was found.      The exam of the esophagus was otherwise normal.      A large amount of food (residue) was found in the gastric fundus, in the       gastric body and in the gastric antrum.      Views of the stomach were very limited; no obvious blood or lesion was       seen.      Food (residue) was found in the duodenal bulb; views very limited; no       obvious blood or lesion was seen. Impression:               - LA Grade A esophagitis with no bleeding.                            Suspected cause of black emesis.                           - A large amount of food (residue) in  the stomach.                           - Retained food in the duodenum. Moderate Sedation:      None Recommendation:           - Return patient to hospital ward for ongoing care.                           - Gastroparesis diet until further notice.                           - Continue present medications.                           - Ok to restart Eliquis now. Continue pantoprazole                            40 mg po qd (or equivalent) now and indefinitely                            upon discharge.                           - Patient has had multiple endoscopies and                            colonoscopies for anemia; endo today was                            principally for black  emesis, which can be                            explained by the esophagitis; do not see need for                            further inpatient work-up at this time.                           [facility] will sign-off; patient can follow-up                            with Dr. Alessandra Bevels as outpatient; please call with                            any questions; thank you for the consultation. Procedure Code(s):        --- Professional ---                           [phone], Esophagogastroduodenoscopy, flexible,                            transoral; diagnostic, including collection of                            specimen(s) by brushing or  washing, when performed                            (separate procedure) Diagnosis Code(s):        --- Professional ---                           K20.90, Esophagitis, unspecified without bleeding                           D62, Acute posthemorrhagic anemia                           K92.0, Hematemesis CPT copyright 2019 [employer]. All rights reserved. The codes documented in this report are preliminary and upon coder review may  be revised to meet current compliance requirements. Arta Silence, MD 07/26/2020 9:43:19 AM This report has been signed electronically. Number of Addenda: 0

## 2020-07-26 NOTE — Progress Notes (Signed)
   07/26/20 1019  Assess: MEWS Score  Temp 98.1 F (36.7 C)  BP 118/84  Pulse Rate (!) 111  Resp 15  Level of Consciousness Alert  SpO2 100 %  Assess: MEWS Score  MEWS Temp 0  MEWS Systolic 0  MEWS Pulse 2  MEWS RR 0  MEWS LOC 0  MEWS Score 2  MEWS Score Color Yellow  Treat  Pain Scale 0-10  Pain Score 4  Pain Type Chronic pain  Pain Location Abdomen  Pain Orientation Lower  Pain Descriptors / Indicators Aching;Discomfort  Pain Frequency Constant  Pain Onset On-going  Pain Intervention(s) Medication (See eMAR)  Multiple Pain Sites No  Take Vital Signs  Increase Vital Sign Frequency  Yellow: Q 2hr X 2 then Q 4hr X 2, if remains yellow, continue Q 4hrs  Escalate  MEWS: Escalate Yellow: discuss with charge nurse/RN and consider discussing with provider and RRT  Notify: Charge Nurse/RN  Name of Charge Nurse/RN Notified Abigail Butts  Date Charge Nurse/RN Notified 07/26/20  Time Charge Nurse/RN Notified 33  Notify: Provider  Provider Name/Title Adhikari MD  Date Provider Notified 07/26/20  Time Provider Notified 1030  Notification Type Page  Notification Reason Other (Comment)  Provider response No new orders  Date of Provider Response 07/26/20  Time of Provider Response 1038  Document  Patient Outcome Stabilized after interventions  Assess: SIRS CRITERIA  SIRS Temperature  0  SIRS Pulse 1  SIRS Respirations  0  SIRS WBC 0  SIRS Score Sum  1

## 2020-07-26 NOTE — Progress Notes (Signed)
PROGRESS NOTE    Deborah [name]  ERD:[id number] DOB: September 09, 1965 DOA: 07/20/2020 PCP: Cleophas Dunker, DO   Chief Complain: Nausea, vomiting, abdominal pain  Brief Narrative: Patient is a 55 year old female with history of diabetes type 2, hypertension, gastroparesis, CKD stage IIIb who presents to the emergency room with complaint of nausea, vomiting, abdominal discomfort.  She was found to be in DKA and was started on DKA protocol.  Lab work also showed AKI on her CKD on presentation.  Currently on sliding scale and Lantus.  Diabetic coordinator following.  Hemoglobin A1c of more than 10.  Hospital course remarkable for persistent abdominal pain, anemia with drop of Hb into the range of 6 needing PRBC transfusion.  GI consulted. Underwent  EGD today with finding of grade D esophagitis with no bleeding, large amount of food in the stomach, retained food in the duodenum.  Patient appears volume overloaded today.  Started on IV Lasix.  Assessment & Plan:   Principal Problem:   DKA (diabetic ketoacidosis) ([place]) Active Problems:   Essential hypertension, benign   CKD (chronic kidney disease), stage II   Chest pain   ARF (acute renal failure) ([place])   DKA: Patient states she is compliant with her medications.  Hemoglobin A1c of more than 10.  Diabetic coordinator was  following.  Started on DKA protocol.  Gap has closed, currently she is on Lantus and sliding scale.    Abdominal pain/diabetic gastroparesis: Presented with nausea, vomiting, abdominal pain.  Cannot tolerate Reglan due to history of tardive dyskinesia.  Continue Zofran, Phenergan.  Continue IV fluids.  Patient  instructed to take small-volume frequent nonfatty meals. She has history of low  Grade  carcinoid tumor of duodenum, status post resection.  She had persistent abdominal discomfort now improving ,follows with oncology. Abdominal CT/pelvis without contrast did not show any acute intra-abdominal findings. She has  been advised to limit IV pain medication for her abdominal pain   AKI on CKD stage IV: Creatinine in the range of 4 on presentation most likely precipitated due to dehydration from nausea/vomiting. She got tons of fluid during this hospitalization.Her baseline creatinine is around 2. She follows with Dr Darlin Coco. Patient appears severely volume overloaded today with bilateral lower extremity edema.  IV fluids stopped and she has been started on Lasix 80 mg twice a day  Hypertension:   Takes hydralazine, Imdur, metoprolol at home.  Increased the dose of hydralazine,metoprolol.BP better  Paroxysmal A. fib:   On Eliquis for anticoagulation.  On metoprolol for rate control.  Monitor in telemetry  Normocytic anemia: Likely chronic and associated with chronic kidney disease.  Hemoglobin dropped to 6.7 on 07/24/20 .  She denies any change in the color of the stool.  She is on Eliquis for A. fib.  Positive  FOBT.  Normal iron as per  iron studies.  She was transfused with a unit of PRBC , hemoglobin in the range of 8 today. Underwent  EGD today with finding of grade D esophagitis with no bleeding, large amount of food in the stomach, retained food in the duodenum.   Continue Protonix  Leukocytosis: Most likely reactive.Afebrile.  UA was not impressive for UTI.  Does not have any respiratory symptoms/low suspicion for pneumonia.  Continue to monitor            DVT prophylaxis:Eliquis Code Status: Full Family Communication: None at bedside Status is: Inpatient    Dispo: The patient is from: Home  Anticipated d/c is to: Home              Patient currently is not medically stable to d/c.   Difficult to place patient No    Consultants: None  Procedures:None  Antimicrobials:  Anti-infectives (From admission, onward)   None      Subjective:   Patient seen and examined the bedside this morning.  Hemodynamically stable.  Just came back from the EGD.  Found to  have bilateral lower extremity edema.  Patient requesting for IV Lasix  Objective: Vitals:   07/26/20 0957 07/26/20 0958 07/26/20 1000 07/26/20 1019  BP:  (!) 132/95  118/84  Pulse: (!) 112 (!) 111 (!) 115 (!) 111  Resp: (!) 24 20 20 15   Temp:    98.1 F (36.7 C)  TempSrc:      SpO2: 100% 100% 100% 100%  Weight:      Height:        Intake/Output Summary (Last 24 hours) at 07/26/2020 1259 Last data filed at 07/26/2020 0934 Gross per 24 hour  Intake 1120.75 ml  Output 250 ml  Net 870.75 ml   Filed Weights   07/20/20 2256 07/23/20 2024  Weight: 135.6 kg 136 kg    Examination:  General exam: Overall comfortable, not in distress, morbidly obese HEENT: PERRL Respiratory system:  no wheezes or crackles  Cardiovascular system: S1 & S2 heard, RRR.  Gastrointestinal system: Abdomen is nondistended, soft and nontender. Central nervous system: Alert and oriented Extremities: 2-3 + bilateral lower extremity edema ,suspicion of lymphadema , no clubbing ,no cyanosis Skin: No rashes, no ulcers,no icterus       Data Reviewed: I have personally reviewed following labs and imaging studies  CBC: Recent Labs  Lab 07/22/20 0301 07/23/20 0253 07/24/20 0512 07/24/20 1611 07/25/20 0457 07/26/20 0844  WBC 16.1* 12.0* 14.2*  --  15.0* 14.5*  NEUTROABS 12.7* 7.2 9.9*  --  11.1* 10.2*  HGB 7.8* 7.2* 6.7* 9.1* 8.6* 8.7*  HCT 25.1* 24.0* 21.9* 29.3* 27.7* 28.7*  MCV 90.3 93.4 90.1  --  91.7 92.0  PLT 492* 437* 445*  --  425* 737*   Basic Metabolic Panel: Recent Labs  Lab 07/22/20 0301 07/23/20 0253 07/24/20 0512 07/25/20 0457 07/26/20 0844  NA 137 136 137 137 135  K 3.5 3.4* 3.3* 3.8 3.5  CL 102 103 103 106 104  CO2 23 25 27 24 22   GLUCOSE 338* 223* 135* 184* 231*  BUN 51* 46* 38* 39* 38*  CREATININE 2.74* 3.26* 2.64* 3.12* 2.71*  CALCIUM 8.5* 8.1* 8.0* 8.0* 7.9*   GFR: Estimated Creatinine Clearance: 32.9 mL/min (A) (by C-G formula based on SCr of 2.71 mg/dL (H)). Liver  Function Tests: Recent Labs  Lab 07/20/20 0100  AST 14*  ALT 8  ALKPHOS 99  BILITOT 1.3*  PROT 8.2*  ALBUMIN 3.2*   Recent Labs  Lab 07/20/20 0100  LIPASE 24   No results for input(s): AMMONIA in the last 168 hours. Coagulation Profile: No results for input(s): INR, PROTIME in the last 168 hours. Cardiac Enzymes: No results for input(s): CKTOTAL, CKMB, CKMBINDEX, TROPONINI in the last 168 hours. BNP (last 3 results) No results for input(s): PROBNP in the last 8760 hours. HbA1C: No results for input(s): HGBA1C in the last 72 hours. CBG: Recent Labs  Lab 07/25/20 1112 07/25/20 1620 07/25/20 2052 07/26/20 0815 07/26/20 1157  GLUCAP 215* 372* 270* 245* 238*   Lipid Profile: No results for input(s): CHOL, HDL, LDLCALC, TRIG,  CHOLHDL, LDLDIRECT in the last 72 hours. Thyroid Function Tests: No results for input(s): TSH, T4TOTAL, FREET4, T3FREE, THYROIDAB in the last 72 hours. Anemia Panel: No results for input(s): VITAMINB12, FOLATE, FERRITIN, TIBC, IRON, RETICCTPCT in the last 72 hours. Sepsis Labs: Recent Labs  Lab 07/22/20 1258  LATICACIDVEN 1.6    Recent Results (from the past 240 hour(s))  Resp Panel by RT-PCR (Flu A&B, Covid) Urine, Clean Catch     Status: None   Collection Time: 07/21/20  4:55 AM   Specimen: Urine, Clean Catch; Nasopharyngeal(NP) swabs in vial transport medium  Result Value Ref Range Status   SARS Coronavirus 2 by RT PCR NEGATIVE NEGATIVE Final    Comment: (NOTE) SARS-CoV-2 target nucleic acids are NOT DETECTED.  The SARS-CoV-2 RNA is generally detectable in upper respiratory specimens during the acute phase of infection. The lowest concentration of SARS-CoV-2 viral copies this assay can detect is 138 copies/mL. A negative result does not preclude SARS-Cov-2 infection and should not be used as the sole basis for treatment or other patient management decisions. A negative result may occur with  improper specimen collection/handling,  submission of specimen other than nasopharyngeal swab, presence of viral mutation(s) within the areas targeted by this assay, and inadequate number of viral copies(<138 copies/mL). A negative result must be combined with clinical observations, patient history, and epidemiological information. The expected result is Negative.  Fact Sheet for Patients:  EntrepreneurPulse.com.au  Fact Sheet for Healthcare Providers:  IncredibleEmployment.be  This test is no t yet approved or cleared by the [place] FDA and  has been authorized for detection and/or diagnosis of SARS-CoV-2 by FDA under an Emergency Use Authorization (EUA). This EUA will remain  in effect (meaning this test can be used) for the duration of the COVID-19 declaration under Section 564(b)(1) of the Act, 21 U.S.C.section 360bbb-3(b)(1), unless the authorization is terminated  or revoked sooner.       Influenza A by PCR NEGATIVE NEGATIVE Final   Influenza B by PCR NEGATIVE NEGATIVE Final    Comment: (NOTE) The Xpert Xpress SARS-CoV-2/FLU/RSV plus assay is intended as an aid in the diagnosis of influenza from Nasopharyngeal swab specimens and should not be used as a sole basis for treatment. Nasal washings and aspirates are unacceptable for Xpert Xpress SARS-CoV-2/FLU/RSV testing.  Fact Sheet for Patients: EntrepreneurPulse.com.au  Fact Sheet for Healthcare Providers: IncredibleEmployment.be  This test is not yet approved or cleared by the [place] FDA and has been authorized for detection and/or diagnosis of SARS-CoV-2 by FDA under an Emergency Use Authorization (EUA). This EUA will remain in effect (meaning this test can be used) for the duration of the COVID-19 declaration under Section 564(b)(1) of the Act, 21 U.S.C. section 360bbb-3(b)(1), unless the authorization is terminated or revoked.  Performed at [facility], [place] [address], [place], [place] [address]   MRSA PCR Screening     Status: Abnormal   Collection Time: 07/21/20  6:16 AM   Specimen: Nasopharyngeal  Result Value Ref Range Status   MRSA by PCR POSITIVE (A) NEGATIVE Final    Comment:        The GeneXpert MRSA Assay (FDA approved for NASAL specimens only), is one component of a comprehensive MRSA colonization surveillance program. It is not intended to diagnose MRSA infection nor to guide or monitor treatment for MRSA infections. RESULT CALLED TO, READ BACK BY AND VERIFIED WITH: Avon Gully RN AT [phone] ON 07/21/20 BY Buel Ream Performed at [facility],  [place] [address] [place], [place] [address]          Radiology Studies: No results found.      Scheduled Meds: . atorvastatin  10 mg Oral Daily  . DULoxetine  40 mg Oral Daily  . furosemide  80 mg Intravenous Q12H  . hydrALAZINE  50 mg Oral TID  . insulin aspart  0-20 Units Subcutaneous TID WC  . insulin aspart  0-5 Units Subcutaneous QHS  . insulin aspart  8 Units Subcutaneous TID WC  . insulin glargine  25 Units Subcutaneous BID  . isosorbide mononitrate  60 mg Oral Daily  . metoprolol succinate  50 mg Oral Daily  . [START ON 07/27/2020] pantoprazole  40 mg Oral Daily  . senna  1 tablet Oral QHS   Continuous Infusions: . promethazine (PHENERGAN) injection (IM or IVPB) Stopped (07/21/20 1531)     LOS: 4 days    Time spent: 35 mins.More than 50% of that time was spent in counseling and/or coordination of care.      Shelly Coss, MD [facility] P5/21/2022, 12:59 PM

## 2020-07-26 NOTE — Anesthesia Postprocedure Evaluation (Addendum)
Anesthesia Post Note  Patient: Deborah [name]  Procedure(s) Performed: ESOPHAGOGASTRODUODENOSCOPY (EGD) (N/A )     Patient location during evaluation: Endoscopy Anesthesia Type: MAC Level of consciousness: awake and alert, patient cooperative and oriented Pain management: pain level controlled Vital Signs Assessment: post-procedure vital signs reviewed and stable Respiratory status: nonlabored ventilation, spontaneous breathing and respiratory function stable Cardiovascular status: blood pressure returned to baseline and stable Postop Assessment: no apparent nausea or vomiting Anesthetic complications: no   No complications documented.  Last Vitals:  Vitals:   07/26/20 1000 07/26/20 1019  BP:  118/84  Pulse: (!) 115 (!) 111  Resp: 20 15  Temp:  36.7 C  SpO2: 100% 100%    Last Pain:  Vitals:   07/26/20 1019  TempSrc:   PainSc: 4                  Irish Piech,E. Herberta Pickron

## 2020-07-26 NOTE — Interval H&P Note (Signed)
History and Physical Interval Note:  07/26/2020 9:18 AM  Deborah [name]  has presented today for surgery, with the diagnosis of nausea/vomiting, anemia.  The various methods of treatment have been discussed with the patient and family. After consideration of risks, benefits and other options for treatment, the patient has consented to  Procedure(s): ESOPHAGOGASTRODUODENOSCOPY (EGD) (N/A) as a surgical intervention.  The patient's history has been reviewed, patient examined, no change in status, stable for surgery.  I have reviewed the patient's chart and labs.  Questions were answered to the patient's satisfaction.     Landry Dyke

## 2020-07-27 DIAGNOSIS — E0810 Diabetes mellitus due to underlying condition with ketoacidosis without coma: Secondary | ICD-10-CM | POA: Diagnosis not present

## 2020-07-27 LAB — CBC WITH DIFFERENTIAL/PLATELET
Abs Immature Granulocytes: 0.08 10*3/uL — ABNORMAL HIGH (ref 0.00–0.07)
Basophils Absolute: 0.1 10*3/uL (ref 0.0–0.1)
Basophils Relative: 0 %
Eosinophils Absolute: 0.6 10*3/uL — ABNORMAL HIGH (ref 0.0–0.5)
Eosinophils Relative: 4 %
HCT: 25.6 % — ABNORMAL LOW (ref 36.0–46.0)
Hemoglobin: 7.8 g/dL — ABNORMAL LOW (ref 12.0–15.0)
Immature Granulocytes: 1 %
Lymphocytes Relative: 15 %
Lymphs Abs: 2.2 10*3/uL (ref 0.7–4.0)
MCH: 28 pg (ref 26.0–34.0)
MCHC: 30.5 g/dL (ref 30.0–36.0)
MCV: 91.8 fL (ref 80.0–100.0)
Monocytes Absolute: 0.9 10*3/uL (ref 0.1–1.0)
Monocytes Relative: 6 %
Neutro Abs: 10.7 10*3/uL — ABNORMAL HIGH (ref 1.7–7.7)
Neutrophils Relative %: 74 %
Platelets: 361 10*3/uL (ref 150–400)
RBC: 2.79 MIL/uL — ABNORMAL LOW (ref 3.87–5.11)
RDW: 15.1 % (ref 11.5–15.5)
WBC: 14.5 10*3/uL — ABNORMAL HIGH (ref 4.0–10.5)
nRBC: 0 % (ref 0.0–0.2)

## 2020-07-27 LAB — BASIC METABOLIC PANEL
Anion gap: 7 (ref 5–15)
BUN: 46 mg/dL — ABNORMAL HIGH (ref 6–20)
CO2: 21 mmol/L — ABNORMAL LOW (ref 22–32)
Calcium: 7.7 mg/dL — ABNORMAL LOW (ref 8.9–10.3)
Chloride: 105 mmol/L (ref 98–111)
Creatinine, Ser: 2.9 mg/dL — ABNORMAL HIGH (ref 0.44–1.00)
GFR, Estimated: 18 mL/min — ABNORMAL LOW (ref >60)
Glucose, Bld: 249 mg/dL — ABNORMAL HIGH (ref 70–99)
Potassium: 3.4 mmol/L — ABNORMAL LOW (ref 3.5–5.1)
Sodium: 133 mmol/L — ABNORMAL LOW (ref 135–145)

## 2020-07-27 LAB — GLUCOSE, CAPILLARY
Glucose-Capillary: 290 mg/dL — ABNORMAL HIGH (ref 70–99)
Glucose-Capillary: 350 mg/dL — ABNORMAL HIGH (ref 70–99)

## 2020-07-27 MED ORDER — TORSEMIDE 20 MG PO TABS: 40.0000 mg | ORAL_TABLET | Freq: Every day | ORAL | 0 refills | Status: DC

## 2020-07-27 MED ORDER — PROMETHAZINE HCL 12.5 MG PO TABS: 12.5000 mg | ORAL_TABLET | Freq: Four times a day (QID) | ORAL | 0 refills | Status: DC | PRN

## 2020-07-27 MED ORDER — POTASSIUM CHLORIDE ER 20 MEQ PO TBCR: 20.0000 meq | EXTENDED_RELEASE_TABLET | Freq: Every day | ORAL | 0 refills | Status: DC

## 2020-07-27 MED ORDER — ALPRAZOLAM 1 MG PO TABS: 1.0000 mg | ORAL_TABLET | Freq: Two times a day (BID) | ORAL | 0 refills | Status: DC | PRN

## 2020-07-27 MED ORDER — HYDRALAZINE HCL 50 MG PO TABS: 50.0000 mg | ORAL_TABLET | Freq: Three times a day (TID) | ORAL | 0 refills | Status: DC

## 2020-07-27 MED ORDER — METOPROLOL SUCCINATE ER 50 MG PO TB24: 50.0000 mg | ORAL_TABLET | Freq: Every day | ORAL | 0 refills | Status: DC

## 2020-07-27 MED ORDER — FERROUS SULFATE 325 (65 FE) MG PO TABS: 325.0000 mg | ORAL_TABLET | Freq: Every day | ORAL | 1 refills | Status: DC

## 2020-07-27 MED ORDER — POTASSIUM CHLORIDE CRYS ER 10 MEQ PO TBCR
40.0000 meq | EXTENDED_RELEASE_TABLET | Freq: Once | ORAL | Status: AC
  Administered 2020-07-27: 40 meq via ORAL
  Filled 2020-07-27: qty 4

## 2020-07-27 NOTE — Discharge Instructions (Signed)
Diabetic Ketoacidosis Diabetic ketoacidosis (DKA) is a serious complication of diabetes. This condition develops when there is not enough insulin in the body. Insulin is a hormone that regulates blood sugar (glucose) levels in the body. Normally, insulin allows glucose to enter the cells in the body. The cells break down glucose for energy. Without enough insulin, the body cannot break down glucose and breaks down fats instead. This leads to high blood glucose levels in the body. It also leads to the production of acids that are called ketones. Ketones are poisonous at high levels. If diabetic ketoacidosis is not treated, it can cause severe dehydration and can lead to a coma or death. What are the causes? This condition develops when a lack of insulin causes the body to break down fats instead of glucose. This may be triggered by:  Stress on the body. This stress can be brought on by an illness.  Infection.  Medicines that raise blood glucose levels.  Not taking or skipping doses of diabetes medicines.  New onset of type 1 diabetes mellitus.  Missing insulin on purpose or by accident.  Interruption of insulin through an insulin pump. This can happen if the cannula that connects you to the insulin pump gets dislodged or kinked. What are the signs or symptoms? Symptoms of this condition include:  Excessive thirst or dry mouth.  Excessive urination.  Abdominal pain.  Nausea or vomiting.  Vision changes.  Fruity or sweet-smelling breath.  Weight loss.  Irritability or confusion.  Rapid breathing.  High blood glucose.  High levels of ketones in the body. To know your ketone levels: ? Collect urine in a small cup. ? Dip a test strip in the urine. ? Wait for it to change color. ? Compare the test strip results to the chart on the container. How is this diagnosed? This condition is diagnosed based on your medical history, a physical exam, and blood tests. You may also have a  urine test to check for ketones. How is this treated? This condition may be treated with:  Fluid replacement. This may be done with IV fluids to correct dehydration.  Correcting high blood glucose with insulin. This may be given through the skin as injections or through an IV.  Electrolyte replacement. Electrolytes are minerals in your blood. Electrolytes such as potassium and sodium may be given in pill form or through an IV.  Antibiotic medicines. These may be prescribed if your condition was caused by an infection. Diabetic ketoacidosis is a serious medical condition. You may need emergency treatment in the hospital so that you can be monitored closely. Follow these instructions at home: Medicines  Take over-the-counter and prescription medicines only as told by your health care provider.  Continue to take insulin and other diabetes medicines as told by your health care provider.  If you were prescribed an antibiotic medicine, take it as told by your health care provider. Do not stop taking the antibiotic even if you start to feel better. Eating and drinking  Drink enough fluids to keep your urine pale yellow.  If you are able to eat, follow your usual diet and drink sugar-free liquids such as water, tea, and sugar-free soft drinks. You can also have sugar-free gelatin or ice pops.  If you are not able to eat, drink liquids that contain sugar in small amounts as you are able. Liquids include fruit juice, regular soft drinks, and sherbet.   Checking ketones and blood glucose  Check your urine for ketones  ketones when you are ill and as told by your health care provider. ? If your blood glucose is 240 mg/dL (13.3 mmol/L) or higher, check your urine ketones every 4 hours. If you have moderate or large ketones, call your health care provider.  Check your blood glucose every day, and as often as told by your health care provider. ? If your blood glucose is high, drink plenty of fluids.  This helps to flush out ketones. ? If your blood glucose is above your target for 2 tests in a row, contact your health care provider.   General instructions  Carry a medical alert card or wear medical alert jewelry that shows that you have diabetes.  Exercise only as told by your health care provider. Do not exercise when your blood glucose is high and you have ketones in your urine.  If you get sick, call your health care provider and begin treatment quickly. Your body often needs extra insulin to fight an illness. Check your blood glucose every 4 hours when you are sick.  Keep all follow-up visits as told by your health care provider. This is important. Where to find more information  American Diabetes Association: diabetes.org Contact a health care provider if:  Your blood glucose level is higher than 240 mg/dL (13.3 mmol/L) for 2 days in a row.  You have moderate or large ketones in your urine.  You have a fever.  You cannot eat or drink without vomiting.  You have been vomiting for more than 2 hours.  You continue to have symptoms of diabetic ketoacidosis.  You develop new symptoms. Get help right away if:  Your blood glucose monitor reads high even when you are taking insulin.  You faint.  You have chest pain.  You have trouble breathing.  You have sudden trouble speaking or swallowing.  You have vomiting or diarrhea that gets worse after 3 hours.  You are unable to stay awake.  You have trouble thinking.  You are severely dehydrated. Symptoms of severe dehydration include: ? Extreme thirst. ? Dry mouth. ? Rapid breathing. These symptoms may represent a serious problem that is an emergency. Do not wait to see if the symptoms will go away. Get medical help right away. Call your local emergency services (911 in the [place] Do not drive yourself to the hospital. Summary  Diabetic ketoacidosis is a serious complication of diabetes. This condition develops when  there is not enough insulin in the body.  This condition is diagnosed based on your medical history, a physical exam, and blood tests. You may also have a urine test to check for ketones.  Diabetic ketoacidosis is a serious medical condition. You may need emergency treatment in the hospital to monitor your condition.  Contact your health care provider if your blood glucose is higher than 240 mg/dL for 2 days in a row or if you have moderate or large ketones in your urine. This information is not intended to replace advice given to you by your health care provider. Make sure you discuss any questions you have with your health care provider. Document Revised: 01/17/2019 Document Reviewed: 01/17/2019 Elsevier Patient Education  2021 [employer]   =======  

## 2020-07-27 NOTE — Discharge Summary (Signed)
Physician Discharge Summary  Deborah [name] ACZ:[id number] DOB: 15-Mar-1965 DOA: 07/20/2020  PCP: Cleophas Dunker, DO  Admit date: 07/20/2020 Discharge date: 07/27/2020  Admitted From: Home Disposition:  Home  Discharge Condition:Stable CODE STATUS:FULL Diet recommendation: Carb Modified  Brief/Interim Summary:  Patient is a 55 year old female with history of diabetes type 2, hypertension, gastroparesis, CKD stage IIIb who presents to the emergency room with complaint of nausea, vomiting, abdominal discomfort. She was found to be in DKA and was started on DKA protocol.  Lab work also showed AKI on her CKD on presentation.  Currently on sliding scale and Lantus.  Diabetic coordinator following.  Hemoglobin A1c of more than 10.  Hospital course remarkable for persistent abdominal pain, anemia with drop of Hb into the range of 6 needing PRBC transfusion.  GI consulted. Underwent  EGD today with finding of grade D esophagitis with no bleeding, large amount of food in the stomach, retained food in the duodenum.  Patient has been instructed to take low-volume frequent nonfatty meals.  Patient has history of noncompliance, severe multiple comorbidities, she has had a chance of readmission in the near future.  Following problems were addressed during her hospitalization:   DKA: Patient states she is compliant with her medications.  Hemoglobin A1c of more than 10.  Diabetic coordinator was  following.  Started on DKA protocol.  Gap has closed, currently she is on Lantus and sliding scale.    Abdominal pain/diabetic gastroparesis: Presented with nausea, vomiting, abdominal pain.  Cannot tolerate Reglan due to history of tardive dyskinesia.  Treated with  Zofran, Phenergan.     Patient  instructed to take small-volume frequent nonfatty meals. She has history of low  Grade  carcinoid tumor of duodenum, status post resection.  She had persistent abdominal discomfort now improving ,follows with  oncology. Abdominal CT/pelvis without contrast did not show any acute intra-abdominal findings.  AKI on CKD stage IV: Creatinine in the range of 4 on presentation most likely precipitated due to dehydration from nausea/vomiting. She got tons of fluid during this hospitalization.Her baseline creatinine is around 2. She follows with Dr Darlin Coco.She takes torsemide at home.  Hypertension:   Takes hydralazine, Imdur, metoprolol at home.  Increased the dose of hydralazine,metoprolol.BP better  Paroxysmal A. fib:   On Eliquis for anticoagulation.  On metoprolol for rate control.  Monitor in telemetry  Normocytic anemia: Likely chronic and associated with chronic kidney disease.  Hemoglobin dropped to 6.7 on 07/24/20 .  She denies any change in the color of the stool.  She is on Eliquis for A. fib.  Positive  FOBT.  Normal iron as per  iron studies.  She was transfused with a unit of PRBC , hemoglobin in the range of 7-8 . Underwent  EGD  with finding of grade D esophagitis with no bleeding, large amount of food in the stomach, retained food in the duodenum.   Continue Protonix  Leukocytosis: Most likely reactive.Afebrile.  UA was not impressive for UTI.  Does not have any respiratory symptoms/low suspicion for pneumonia.  Monitor as an outpatient   Discharge Diagnoses:  Principal Problem:   DKA (diabetic ketoacidosis) ([facility]) Active Problems:   Essential hypertension, benign   CKD (chronic kidney disease), stage II   Chest pain   ARF (acute renal failure) ([facility])    Discharge Instructions  Discharge Instructions    Diet Carb Modified   Complete by: As directed    Discharge instructions   Complete by: As directed  1)Please take prescribed medications as instructed. 2)Follow up with your PCP in a week.  Do a BMP test to check your kidney function during the follow-up.  Follow-up with your nephrologist in a week. 3)Take small-volume frequent nonfatty meals 4)Closely monitor  your blood sugars and blood pressure at home.   Increase activity slowly   Complete by: As directed      Allergies as of 07/27/2020      Reactions   Diazepam [name]   Gabapentin [name], Swelling   Other reaction(s): Unknown   Iodinated Diagnostic Agents Anaphylaxis   11/29/17 Cardiac arrest 1 min after IV contrast, possible allergy vs vasovagal episode Iopamidol  Anaphylaxis  High 11/28/2017  Patient had seizure like activity and then code post 100 cc of isovue 300    Isovue [iopamidol] Anaphylaxis   11/28/17 Patient had seizure like activity and then 1 min code after 100 cc of isovue 300. Possible contrast allergy vs vasovagal episode   Lisinopril Anaphylaxis   Tongue and mouth swelling   Metoclopramide Other (See Comments)   Tardive dyskinesia   Nsaids Anaphylaxis, Other (See Comments)   ULCER   Penicillins Palpitations   Has patient had a PCN reaction causing immediate rash, facial/tongue/throat swelling, SOB or lightheadedness with hypotension: Yes, heart races Has patient had a PCN reaction causing severe rash involving mucus membranes or skin necrosis: No Has patient had a PCN reaction that required hospitalization: Yes  Has patient had a PCN reaction occurring within the last 10 years: No   Tolmetin Nausea Only, Other (See Comments)   ULCER   Aspirin Other (See Comments)   Irritates stomach ulcer    Cyclobenzaprine    Other reaction(s): Unknown   Rifamycins    Acetaminophen Nausea Only, Other (See Comments)   Irritates stomach ulcer; Abdominal pain   Tramadol Nausea And Vomiting      Medication List    STOP taking these medications   fluconazole 50 MG tablet Commonly known as: Diflucan     TAKE these medications   albuterol (2.5 MG/3ML) 0.083% nebulizer solution Commonly known as: PROVENTIL Take 3 mLs (2.5 mg total) by nebulization every 6 (six) hours as needed for wheezing or shortness of breath.   allopurinol 100 MG  tablet Commonly known as: ZYLOPRIM Take 100 mg by mouth daily.   ALPRAZolam 1 MG tablet Commonly known as: XANAX Take 1 tablet (1 mg total) by mouth 2 (two) times daily as needed for anxiety.   atorvastatin 10 MG tablet Commonly known as: LIPITOR TAKE ONE TABLET BY MOUTH ONCE DAILY (AM) What changed: See the new instructions.   blood glucose meter kit and supplies Kit Dispense based on patient and insurance preference. Use up to four times daily as directed. (FOR ICD-9 250.00, 250.01). What changed:   how much to take  how to take this  when to take this   DULoxetine HCl 40 MG Cpep TAKE ONE CAPSULE BY MOUTH TWICE DAILY What changed:   how much to take  when to take this   Easy Comfort Pen Needles 31G X 5 MM Misc Generic drug: Insulin Pen Needle USE 3 TIMES A DAY FOR INSULIN ADMINISTRATION What changed: See the new instructions.   Eliquis 5 MG Tabs tablet Generic drug: apixaban TAKE 1 TABLET BY MOUTH 2 (TWO) TIMES DAILY. What changed: how much to take   Farxiga 10 MG Tabs tablet Generic drug: dapagliflozin propanediol Take 10 mg by mouth daily.   ferrous sulfate 325 (  65 FE) MG tablet Take 1 tablet (325 mg total) by mouth daily with breakfast.   fluticasone 50 MCG/ACT nasal spray Commonly known as: FLONASE Place 2 sprays into both nostrils daily as needed for allergies or rhinitis.   hydrALAZINE 50 MG tablet Commonly known as: APRESOLINE Take 1 tablet (50 mg total) by mouth 3 (three) times daily. What changed:   medication strength  how much to take   HYDROmorphone 2 MG tablet Commonly known as: DILAUDID Take 1 tablet (2 mg total) by mouth daily as needed for severe pain.   hydrOXYzine 10 MG tablet Commonly known as: ATARAX/VISTARIL Take 1 tablet (10 mg total) by mouth 3 (three) times daily as needed.   isosorbide mononitrate 60 MG 24 hr tablet Commonly known as: IMDUR TAKE 1 TABLET (60 MG TOTAL) BY MOUTH DAILY.   Lantus SoloStar 100 UNIT/ML  Solostar Pen Generic drug: insulin glargine Inject 32 Units into the skin at bedtime. What changed: how much to take   melatonin 3 MG Tabs tablet Take 2 tablets (6 mg total) by mouth at bedtime.   metoprolol succinate 50 MG 24 hr tablet Commonly known as: TOPROL-XL Take 1 tablet (50 mg total) by mouth daily. Take with or immediately following a meal. Start taking on: Jul 28, 2020 What changed:   medication strength  how much to take  additional instructions   nitroGLYCERIN 0.4 MG SL tablet Commonly known as: NITROSTAT Place 1 tablet (0.4 mg total) under the tongue every 5 (five) minutes as needed for chest pain.   NovoLOG FlexPen 100 UNIT/ML FlexPen Generic drug: insulin aspart Inject 30 Units into the skin in the morning, at noon, in the evening, and at bedtime.   ondansetron 4 MG tablet Commonly known as: ZOFRAN Take 1 tablet (4 mg total) by mouth every 6 (six) hours as needed for nausea.   pantoprazole 40 MG tablet Commonly known as: PROTONIX TAKE ONE TABLET BY MOUTH TWICE DAILY   Potassium Chloride ER 20 MEQ Tbcr Take 20 mEq by mouth daily. Start taking on: Jul 28, 2020 What changed:   medication strength  how much to take   promethazine 12.5 MG tablet Commonly known as: PHENERGAN Take 1 tablet (12.5 mg total) by mouth every 6 (six) hours as needed for nausea or vomiting.   torsemide 20 MG tablet Commonly known as: DEMADEX Take 2 tablets (40 mg total) by mouth daily.       Follow-up Information    Meccariello, Bernita Raisin, DO. Schedule an appointment as soon as possible for a visit in 1 week(s).   Specialty: Family Medicine Contact information: [address] [address] [phone]              Allergies  Allergen Reactions  . Diazepam [name]  . Gabapentin [name] and Swelling    Other reaction(s): Unknown  . Iodinated Diagnostic Agents Anaphylaxis    11/29/17 Cardiac arrest 1 min after IV contrast,  possible allergy vs vasovagal episode Iopamidol  Anaphylaxis  High 11/28/2017  Patient had seizure like activity and then code post 100 cc of isovue 300   . Isovue [Iopamidol] Anaphylaxis    11/28/17 Patient had seizure like activity and then 1 min code after 100 cc of isovue 300. Possible contrast allergy vs vasovagal episode  . Lisinopril Anaphylaxis    Tongue and mouth swelling  . Metoclopramide Other (See Comments)    Tardive dyskinesia  . Nsaids Anaphylaxis and Other (See Comments)  ULCER  . Penicillins Palpitations    Has patient had a PCN reaction causing immediate rash, facial/tongue/throat swelling, SOB or lightheadedness with hypotension: Yes, heart races Has patient had a PCN reaction causing severe rash involving mucus membranes or skin necrosis: No Has patient had a PCN reaction that required hospitalization: Yes  Has patient had a PCN reaction occurring within the last 10 years: No   . Tolmetin Nausea Only and Other (See Comments)    ULCER  . Aspirin Other (See Comments)    Irritates stomach ulcer   . Cyclobenzaprine     Other reaction(s): Unknown  . Rifamycins   . Acetaminophen Nausea Only and Other (See Comments)    Irritates stomach ulcer; Abdominal pain  . Tramadol Nausea And Vomiting    Consultations:  GI   Procedures/Studies: CT ABDOMEN PELVIS WO CONTRAST  Result Date: 07/22/2020 CLINICAL DATA:  55 year old with acute abdominal pain. EXAM: CT ABDOMEN AND PELVIS WITHOUT CONTRAST TECHNIQUE: Multidetector CT imaging of the abdomen and pelvis was performed following the standard protocol without IV contrast. COMPARISON:  05/29/2020 FINDINGS: Lower chest: Patchy densities in the right lower lobe are suggestive for atelectasis. No large pleural effusions. Hepatobiliary: Gallbladder has been removed. No focal abnormality to the liver. No significant biliary dilatation. Pancreas: Unremarkable. No pancreatic ductal dilatation or surrounding inflammatory changes.  Spleen: Normal in size without focal abnormality. Adrenals/Urinary Tract: Normal appearance of the adrenal glands. Normal appearance of the urinary bladder. Negative for kidney stones or hydronephrosis. Stomach/Bowel: Stomach is within normal limits. No evidence of bowel wall thickening, distention, or inflammatory changes. Vascular/Lymphatic: Small amount of wall calcifications involving the abdominal aorta without aneurysm. No significant lymph node enlargement in the abdomen or pelvis. Reproductive: Again noted is a rounded structure in the region of the left adnexa that measures up to 4.6 cm and stable. This was described as an exophytic fibroid on previous MRI from 11/29/2017. Otherwise, stable appearance of the uterus and adnexal structures. Other: Negative for ascites.  Negative for free air. Musculoskeletal: Significant degenerative facet disease in lower lumbar spine. IMPRESSION: 1. No acute abnormality in the abdomen or pelvis. 2. Cholecystectomy. 3.  Aortic Atherosclerosis (ICD10-I70.0). Electronically Signed   By: Markus Daft M.D.   On: 07/22/2020 12:57   DG CHEST PORT 1 VIEW  Result Date: 07/23/2020 CLINICAL DATA:  Chest pain EXAM: PORTABLE CHEST 1 VIEW COMPARISON:  07/20/2020 FINDINGS: Cardiac shadow is mildly prominent but stable. Slight increased vascular congestion is noted without interstitial edema. No bony abnormality is noted. IMPRESSION: Mild increased vascular congestion.  No edema is noted. Electronically Signed   By: Inez Catalina M.D.   On: 07/23/2020 21:49   DG Chest Port 1 View  Result Date: 07/21/2020 CLINICAL DATA:  Nausea vomiting abdominal pain EXAM: PORTABLE CHEST 1 VIEW COMPARISON:  05/25/2020 FINDINGS: The heart size and mediastinal contours are within normal limits. Both lungs are clear. The visualized skeletal structures are unremarkable. IMPRESSION: No active disease. Electronically Signed   By: Fidela Salisbury MD   On: 07/21/2020 01:41       Subjective:  Patient  seen and examined the bedside this morning.  Hemodynamically stable for discharge today.   Discharge Exam: Vitals:   07/26/20 2059 07/27/20 0522  BP: (!) 137/91 (!) 142/90  Pulse: 98 (!) 105  Resp: 16 16  Temp: 98.4 F (36.9 C) (!) 97.3 F (36.3 C)  SpO2: 100% 100%   Vitals:   07/26/20 1019 07/26/20 1619 07/26/20 2059 07/27/20 0522  BP:  118/84 125/64 (!) 137/91 (!) 142/90  Pulse: (!) 111 (!) 101 98 (!) 105  Resp: '15 20 16 16  ' Temp: 98.1 F (36.7 C) (!) 97.1 F (36.2 C) 98.4 F (36.9 C) (!) 97.3 F (36.3 C)  TempSrc:   Oral Oral  SpO2: 100% 100% 100% 100%  Weight:      Height:        General: Pt is alert, awake, not in acute distress Cardiovascular: RRR, S1/S2 +, no rubs, no gallops Respiratory: CTA bilaterally, no wheezing, no rhonchi Abdominal: Soft, NT, ND, bowel sounds + Extremities: Bilateral lower extremity edema, no cyanosis    The results of significant diagnostics from this hospitalization (including imaging, microbiology, ancillary and laboratory) are listed below for reference.     Microbiology: Recent Results (from the past 240 hour(s))  Resp Panel by RT-PCR (Flu A&B, Covid) Urine, Clean Catch     Status: None   Collection Time: 07/21/20  4:55 AM   Specimen: Urine, Clean Catch; Nasopharyngeal(NP) swabs in vial transport medium  Result Value Ref Range Status   SARS Coronavirus 2 by RT PCR NEGATIVE NEGATIVE Final    Comment: (NOTE) SARS-CoV-2 target nucleic acids are NOT DETECTED.  The SARS-CoV-2 RNA is generally detectable in upper respiratory specimens during the acute phase of infection. The lowest concentration of SARS-CoV-2 viral copies this assay can detect is 138 copies/mL. A negative result does not preclude SARS-Cov-2 infection and should not be used as the sole basis for treatment or other patient management decisions. A negative result may occur with  improper specimen collection/handling, submission of specimen other than nasopharyngeal  swab, presence of viral mutation(s) within the areas targeted by this assay, and inadequate number of viral copies(<138 copies/mL). A negative result must be combined with clinical observations, patient history, and epidemiological information. The expected result is Negative.  Fact Sheet for Patients:  EntrepreneurPulse.com.au  Fact Sheet for Healthcare Providers:  IncredibleEmployment.be  This test is no t yet approved or cleared by the [place] FDA and  has been authorized for detection and/or diagnosis of SARS-CoV-2 by FDA under an Emergency Use Authorization (EUA). This EUA will remain  in effect (meaning this test can be used) for the duration of the COVID-19 declaration under Section 564(b)(1) of the Act, 21 U.S.C.section 360bbb-3(b)(1), unless the authorization is terminated  or revoked sooner.       Influenza A by PCR NEGATIVE NEGATIVE Final   Influenza B by PCR NEGATIVE NEGATIVE Final    Comment: (NOTE) The Xpert Xpress SARS-CoV-2/FLU/RSV plus assay is intended as an aid in the diagnosis of influenza from Nasopharyngeal swab specimens and should not be used as a sole basis for treatment. Nasal washings and aspirates are unacceptable for Xpert Xpress SARS-CoV-2/FLU/RSV testing.  Fact Sheet for Patients: EntrepreneurPulse.com.au  Fact Sheet for Healthcare Providers: IncredibleEmployment.be  This test is not yet approved or cleared by the [place] FDA and has been authorized for detection and/or diagnosis of SARS-CoV-2 by FDA under an Emergency Use Authorization (EUA). This EUA will remain in effect (meaning this test can be used) for the duration of the COVID-19 declaration under Section 564(b)(1) of the Act, 21 U.S.C. section 360bbb-3(b)(1), unless the authorization is terminated or revoked.  Performed at [facility], [place] [address], [place], [place] [address]    MRSA PCR Screening     Status: Abnormal   Collection Time: 07/21/20  6:16 AM   Specimen: Nasopharyngeal  Result Value Ref Range Status   MRSA  by PCR POSITIVE (A) NEGATIVE Final    Comment:        The GeneXpert MRSA Assay (FDA approved for NASAL specimens only), is one component of a comprehensive MRSA colonization surveillance program. It is not intended to diagnose MRSA infection nor to guide or monitor treatment for MRSA infections. RESULT CALLED TO, READ BACK BY AND VERIFIED WITH: Avon Gully RN AT [phone] ON 07/21/20 BY Buel Ream Performed at [facility], [place] [address] [place],  [address]      Labs: BNP (last 3 results) Recent Labs    01/08/20 1220 01/23/20 1315 05/25/20 1102  BNP 250.9* 165.0* 69.4   Basic Metabolic Panel: Recent Labs  Lab 07/23/20 0253 07/24/20 0512 07/25/20 0457 07/26/20 0844 07/27/20 0601  NA 136 137 137 135 133*  K 3.4* 3.3* 3.8 3.5 3.4*  CL 103 103 106 104 105  CO2 '25 27 24 22 ' 21*  GLUCOSE 223* 135* 184* 231* 249*  BUN 46* 38* 39* 38* 46*  CREATININE 3.26* 2.64* 3.12* 2.71* 2.90*  CALCIUM 8.1* 8.0* 8.0* 7.9* 7.7*   Liver Function Tests: No results for input(s): AST, ALT, ALKPHOS, BILITOT, PROT, ALBUMIN in the last 168 hours. No results for input(s): LIPASE, AMYLASE in the last 168 hours. No results for input(s): AMMONIA in the last 168 hours. CBC: Recent Labs  Lab 07/23/20 0253 07/24/20 0512 07/24/20 1611 07/25/20 0457 07/26/20 0844 07/27/20 0601  WBC 12.0* 14.2*  --  15.0* 14.5* 14.5*  NEUTROABS 7.2 9.9*  --  11.1* 10.2* 10.7*  HGB 7.2* 6.7* 9.1* 8.6* 8.7* 7.8*  HCT 24.0* 21.9* 29.3* 27.7* 28.7* 25.6*  MCV 93.4 90.1  --  91.7 92.0 91.8  PLT 437* 445*  --  425* 427* 361   Cardiac Enzymes: No results for input(s): CKTOTAL, CKMB, CKMBINDEX, TROPONINI in the last 168 hours. BNP: Invalid input(s): POCBNP CBG: Recent Labs  Lab 07/26/20 0815 07/26/20 1157 07/26/20 1700 07/26/20 2101  07/27/20 0738  GLUCAP 245* 238* 339* 260* 290*   D-Dimer No results for input(s): DDIMER in the last 72 hours. Hgb A1c No results for input(s): HGBA1C in the last 72 hours. Lipid Profile No results for input(s): CHOL, HDL, LDLCALC, TRIG, CHOLHDL, LDLDIRECT in the last 72 hours. Thyroid function studies No results for input(s): TSH, T4TOTAL, T3FREE, THYROIDAB in the last 72 hours.  Invalid input(s): FREET3 Anemia work up No results for input(s): VITAMINB12, FOLATE, FERRITIN, TIBC, IRON, RETICCTPCT in the last 72 hours. Urinalysis    Component Value Date/Time   COLORURINE YELLOW 07/21/2020 0424   APPEARANCEUR CLEAR 07/21/2020 0424   LABSPEC 1.007 07/21/2020 0424   PHURINE 5.0 07/21/2020 0424   GLUCOSEU >=500 (A) 07/21/2020 0424   HGBUR SMALL (A) 07/21/2020 0424   BILIRUBINUR NEGATIVE 07/21/2020 0424   KETONESUR 20 (A) 07/21/2020 0424   PROTEINUR 100 (A) 07/21/2020 0424   UROBILINOGEN 0.2 10/02/2013 2108   NITRITE NEGATIVE 07/21/2020 0424   LEUKOCYTESUR NEGATIVE 07/21/2020 0424   Sepsis Labs Invalid input(s): PROCALCITONIN,  WBC,  LACTICIDVEN Microbiology Recent Results (from the past 240 hour(s))  Resp Panel by RT-PCR (Flu A&B, Covid) Urine, Clean Catch     Status: None   Collection Time: 07/21/20  4:55 AM   Specimen: Urine, Clean Catch; Nasopharyngeal(NP) swabs in vial transport medium  Result Value Ref Range Status   SARS Coronavirus 2 by RT PCR NEGATIVE NEGATIVE Final    Comment: (NOTE) SARS-CoV-2 target nucleic acids are NOT DETECTED.  The SARS-CoV-2 RNA is generally detectable in  upper respiratory specimens during the acute phase of infection. The lowest concentration of SARS-CoV-2 viral copies this assay can detect is 138 copies/mL. A negative result does not preclude SARS-Cov-2 infection and should not be used as the sole basis for treatment or other patient management decisions. A negative result may occur with  improper specimen collection/handling, submission  of specimen other than nasopharyngeal swab, presence of viral mutation(s) within the areas targeted by this assay, and inadequate number of viral copies(<138 copies/mL). A negative result must be combined with clinical observations, patient history, and epidemiological information. The expected result is Negative.  Fact Sheet for Patients:  EntrepreneurPulse.com.au  Fact Sheet for Healthcare Providers:  IncredibleEmployment.be  This test is no t yet approved or cleared by the [place] FDA and  has been authorized for detection and/or diagnosis of SARS-CoV-2 by FDA under an Emergency Use Authorization (EUA). This EUA will remain  in effect (meaning this test can be used) for the duration of the COVID-19 declaration under Section 564(b)(1) of the Act, 21 U.S.C.section 360bbb-3(b)(1), unless the authorization is terminated  or revoked sooner.       Influenza A by PCR NEGATIVE NEGATIVE Final   Influenza B by PCR NEGATIVE NEGATIVE Final    Comment: (NOTE) The Xpert Xpress SARS-CoV-2/FLU/RSV plus assay is intended as an aid in the diagnosis of influenza from Nasopharyngeal swab specimens and should not be used as a sole basis for treatment. Nasal washings and aspirates are unacceptable for Xpert Xpress SARS-CoV-2/FLU/RSV testing.  Fact Sheet for Patients: EntrepreneurPulse.com.au  Fact Sheet for Healthcare Providers: IncredibleEmployment.be  This test is not yet approved or cleared by the [place] FDA and has been authorized for detection and/or diagnosis of SARS-CoV-2 by FDA under an Emergency Use Authorization (EUA). This EUA will remain in effect (meaning this test can be used) for the duration of the COVID-19 declaration under Section 564(b)(1) of the Act, 21 U.S.C. section 360bbb-3(b)(1), unless the authorization is terminated or revoked.  Performed at [facility], [place]  [address] [place], [place] [address]   MRSA PCR Screening     Status: Abnormal   Collection Time: 07/21/20  6:16 AM   Specimen: Nasopharyngeal  Result Value Ref Range Status   MRSA by PCR POSITIVE (A) NEGATIVE Final    Comment:        The GeneXpert MRSA Assay (FDA approved for NASAL specimens only), is one component of a comprehensive MRSA colonization surveillance program. It is not intended to diagnose MRSA infection nor to guide or monitor treatment for MRSA infections. RESULT CALLED TO, READ BACK BY AND VERIFIED WITH: Avon Gully RN AT [phone] ON 07/21/20 BY Buel Ream Performed at [facility], [place] [address], [place], [place] [address]     Please note: You were cared for by a hospitalist during your hospital stay. Once you are discharged, your primary care physician will handle any further medical issues. Please note that NO REFILLS for any discharge medications will be authorized once you are discharged, as it is imperative that you return to your primary care physician (or establish a relationship with a primary care physician if you do not have one) for your post hospital discharge needs so that they can reassess your need for medications and monitor your lab values.    Time coordinating discharge: 40 minutes  SIGNED:   Shelly Coss, MD  [facility] 07/27/2020, 10:58 AM Pager [phone]  If 7PM-7AM, please contact night-coverage [email or web address] Password [email or web address]

## 2020-07-27 NOTE — Plan of Care (Signed)
  Problem: Education: Goal: Ability to describe self-care measures that may prevent or decrease complications (Diabetes Survival Skills Education) will improve Outcome: Adequate for Discharge Goal: Individualized Educational Video(s) Outcome: Adequate for Discharge   Problem: Cardiac: Goal: Ability to maintain an adequate cardiac output will improve Outcome: Adequate for Discharge   Problem: Health Behavior/Discharge Planning: Goal: Ability to identify and utilize available resources and services will improve Outcome: Adequate for Discharge Goal: Ability to manage health-related needs will improve Outcome: Adequate for Discharge   Problem: Fluid Volume: Goal: Ability to achieve a balanced intake and output will improve Outcome: Adequate for Discharge   Problem: Metabolic: Goal: Ability to maintain appropriate glucose levels will improve Outcome: Adequate for Discharge   Problem: Nutritional: Goal: Maintenance of adequate nutrition will improve Outcome: Adequate for Discharge Goal: Maintenance of adequate weight for body size and type will improve Outcome: Adequate for Discharge   Problem: Respiratory: Goal: Will regain and/or maintain adequate ventilation Outcome: Adequate for Discharge   Problem: Urinary Elimination: Goal: Ability to achieve and maintain adequate renal perfusion and functioning will improve Outcome: Adequate for Discharge   Problem: Education: Goal: Knowledge of General Education information will improve Description: Including pain rating scale, medication(s)/side effects and non-pharmacologic comfort measures Outcome: Adequate for Discharge   Problem: Health Behavior/Discharge Planning: Goal: Ability to manage health-related needs will improve Outcome: Adequate for Discharge   Problem: Clinical Measurements: Goal: Ability to maintain clinical measurements within normal limits will improve Outcome: Adequate for Discharge Goal: Will remain free from  infection Outcome: Adequate for Discharge Goal: Diagnostic test results will improve Outcome: Adequate for Discharge Goal: Respiratory complications will improve Outcome: Adequate for Discharge Goal: Cardiovascular complication will be avoided Outcome: Adequate for Discharge   Problem: Activity: Goal: Risk for activity intolerance will decrease Outcome: Adequate for Discharge   Problem: Nutrition: Goal: Adequate nutrition will be maintained Outcome: Adequate for Discharge   Problem: Coping: Goal: Level of anxiety will decrease Outcome: Adequate for Discharge   Problem: Elimination: Goal: Will not experience complications related to bowel motility Outcome: Adequate for Discharge Goal: Will not experience complications related to urinary retention Outcome: Adequate for Discharge   Problem: Pain Managment: Goal: General experience of comfort will improve Outcome: Adequate for Discharge   Problem: Safety: Goal: Ability to remain free from injury will improve Outcome: Adequate for Discharge   Problem: Skin Integrity: Goal: Risk for impaired skin integrity will decrease Outcome: Adequate for Discharge   

## 2020-07-27 NOTE — Progress Notes (Signed)
Patient ordered two breakfast treys including 2 brownies and saltine crackers.  Service response contacted and asked to not send second treys to this patient.

## 2020-07-27 NOTE — Progress Notes (Signed)
Discharge instructions given with stated understanding.  Patient waiting for  Transportation home at this time 

## 2020-07-28 ENCOUNTER — Encounter (HOSPITAL_COMMUNITY): Payer: Self-pay | Admitting: Gastroenterology

## 2020-07-31 ENCOUNTER — Ambulatory Visit: Payer: Medicare Other | Admitting: Registered Nurse

## 2020-08-01 ENCOUNTER — Other Ambulatory Visit: Payer: Self-pay

## 2020-08-01 ENCOUNTER — Encounter: Payer: Medicare Other | Attending: Physical Medicine and Rehabilitation | Admitting: Registered Nurse

## 2020-08-01 ENCOUNTER — Encounter: Payer: Self-pay | Admitting: Registered Nurse

## 2020-08-01 VITALS — BP 93/57 | HR 92 | Temp 97.8°F | Ht 66.0 in

## 2020-08-01 DIAGNOSIS — G894 Chronic pain syndrome: Secondary | ICD-10-CM | POA: Insufficient documentation

## 2020-08-01 DIAGNOSIS — Z5181 Encounter for therapeutic drug level monitoring: Secondary | ICD-10-CM | POA: Insufficient documentation

## 2020-08-01 DIAGNOSIS — C7A010 Malignant carcinoid tumor of the duodenum: Secondary | ICD-10-CM | POA: Insufficient documentation

## 2020-08-01 DIAGNOSIS — G893 Neoplasm related pain (acute) (chronic): Secondary | ICD-10-CM | POA: Diagnosis present

## 2020-08-01 DIAGNOSIS — I959 Hypotension, unspecified: Secondary | ICD-10-CM | POA: Diagnosis present

## 2020-08-01 DIAGNOSIS — Z79891 Long term (current) use of opiate analgesic: Secondary | ICD-10-CM | POA: Diagnosis not present

## 2020-08-01 DIAGNOSIS — I9589 Other hypotension: Secondary | ICD-10-CM

## 2020-08-01 MED ORDER — HYDROMORPHONE HCL 2 MG PO TABS: 2.0000 mg | ORAL_TABLET | Freq: Every day | ORAL | 0 refills | Status: DC | PRN

## 2020-08-01 NOTE — Progress Notes (Signed)
Subjective:    Patient ID: Deborah [name], female    DOB: [birth year], 55 y.o.   MRN: [id number]  HPI: Deborah [name] is a 55 y.o. female who returns for follow up appointment for chronic pain and medication refill. She states her pain is located in her abdomen and reports generalized pain. She rates her pain 10. Her current exercise regime is walking short distances and transferring to her wheelchair.   Deborah [name] was admitted into [facility] on 07/20/2020 and discharged home on 07/27/2020 for N/V and abdominal pain. Discharge summary was reviewed.   Deborah [name] arrived to office hypotensive, blood pressure was re-chaeck, her antihypertensive medication was recently changed. The above discussed with Dr Ranell Patrick. She will hold her afternoon dose of hydralazine and decrease her Hydralazine to 25 mg three times a dy. She was also  instructed to check her blood pressure prior to taking her antihypertensive medications, and to call her PCP with readings. At this time she refuses ED evaluation.   Deborah [name] Morphine equivalent is 8.00 MME.  She is also prescribed Alprazolam by Dr. Buena Irish. We have discussed the black box warning of using opioids and benzodiazepines. I highlighted the dangers of using these drugs together and discussed the adverse events including respiratory suppression, overdose, cognitive impairment and importance of compliance with current regimen. We will continue to monitor and adjust as indicated  Oral Swab was Performed today.   Pain Inventory Average Pain 10 Pain Right Now 10 My pain is constant, sharp and aching  In the last 24 hours, has pain interfered with the following? General activity 9 Relation with others 10 Enjoyment of life 10 What TIME of day is your pain at its worst? morning , daytime, evening, night and varies Sleep (in general) Poor  Pain is worse with: walking and some activites Pain improves with: medication Relief from  Meds: 4  Family History  Problem Relation Age of Onset  . Diabetes Mother   . Diabetes Father   . Heart disease Father   . Diabetes Sister   . Congestive Heart Failure Sister 20  . Diabetes Brother    Social History   Socioeconomic History  . Marital status: Single    Spouse name: Not on file  . Number of children: Not on file  . Years of education: Not on file  . Highest education level: Not on file  Occupational History  . Not on file  Tobacco Use  . Smoking status: Never Smoker  . Smokeless tobacco: Never Used  Vaping Use  . Vaping Use: Never used  Substance and Sexual Activity  . Alcohol use: No  . Drug use: No  . Sexual activity: Not Currently    Birth control/protection: None  Other Topics Concern  . Not on file  Social History Narrative   ** Merged History Encounter **       Social Determinants of Health   Financial Resource Strain: Not on file  Food Insecurity: Not on file  Transportation Needs: Not on file  Physical Activity: Not on file  Stress: Not on file  Social Connections: Not on file   Past Surgical History:  Procedure Laterality Date  . BIOPSY  07/27/2019   Procedure: BIOPSY;  Surgeon: Clarene Essex, MD;  Location: [facility];  Service: Endoscopy;;  . BIOPSY  07/30/2019   Procedure: BIOPSY;  Surgeon: Otis Brace, MD;  Location: [facility];  Service: Gastroenterology;;  . CATARACT EXTRACTION  01/2014  . CHOLECYSTECTOMY    .  COLONOSCOPY WITH PROPOFOL N/A 07/30/2019   Procedure: COLONOSCOPY WITH PROPOFOL;  Surgeon: Otis Brace, MD;  Location: [facility];  Service: Gastroenterology;  Laterality: N/A;  . ESOPHAGOGASTRODUODENOSCOPY N/A 07/27/2019   Procedure: ESOPHAGOGASTRODUODENOSCOPY (EGD);  Surgeon: Clarene Essex, MD;  Location: [facility];  Service: Endoscopy;  Laterality: N/A;  . ESOPHAGOGASTRODUODENOSCOPY N/A 07/26/2020   Procedure: ESOPHAGOGASTRODUODENOSCOPY (EGD);  Surgeon: Arta Silence, MD;  Location: [facility];  Service:  Endoscopy;  Laterality: N/A;  . ESOPHAGOGASTRODUODENOSCOPY (EGD) WITH PROPOFOL N/A 08/02/2019   Procedure: ESOPHAGOGASTRODUODENOSCOPY (EGD) WITH PROPOFOL;  Surgeon: Otis Brace, MD;  Location: [facility];  Service: Gastroenterology;  Laterality: N/A;  . HEMOSTASIS CLIP PLACEMENT  08/02/2019   Procedure: HEMOSTASIS CLIP PLACEMENT;  Surgeon: Otis Brace, MD;  Location: [facility];  Service: Gastroenterology;;  . POLYPECTOMY  07/30/2019   Procedure: POLYPECTOMY;  Surgeon: Otis Brace, MD;  Location: [facility];  Service: Gastroenterology;;  . POLYPECTOMY  08/02/2019   Procedure: POLYPECTOMY;  Surgeon: Otis Brace, MD;  Location: [facility];  Service: Gastroenterology;;   Past Surgical History:  Procedure Laterality Date  . BIOPSY  07/27/2019   Procedure: BIOPSY;  Surgeon: Clarene Essex, MD;  Location: [facility];  Service: Endoscopy;;  . BIOPSY  07/30/2019   Procedure: BIOPSY;  Surgeon: Otis Brace, MD;  Location: [facility];  Service: Gastroenterology;;  . CATARACT EXTRACTION  01/2014  . CHOLECYSTECTOMY    . COLONOSCOPY WITH PROPOFOL N/A 07/30/2019   Procedure: COLONOSCOPY WITH PROPOFOL;  Surgeon: Otis Brace, MD;  Location: [facility];  Service: Gastroenterology;  Laterality: N/A;  . ESOPHAGOGASTRODUODENOSCOPY N/A 07/27/2019   Procedure: ESOPHAGOGASTRODUODENOSCOPY (EGD);  Surgeon: Clarene Essex, MD;  Location: [facility];  Service: Endoscopy;  Laterality: N/A;  . ESOPHAGOGASTRODUODENOSCOPY N/A 07/26/2020   Procedure: ESOPHAGOGASTRODUODENOSCOPY (EGD);  Surgeon: Arta Silence, MD;  Location: [facility];  Service: Endoscopy;  Laterality: N/A;  . ESOPHAGOGASTRODUODENOSCOPY (EGD) WITH PROPOFOL N/A 08/02/2019   Procedure: ESOPHAGOGASTRODUODENOSCOPY (EGD) WITH PROPOFOL;  Surgeon: Otis Brace, MD;  Location: [facility];  Service: Gastroenterology;  Laterality: N/A;  . HEMOSTASIS CLIP PLACEMENT  08/02/2019   Procedure: HEMOSTASIS CLIP PLACEMENT;   Surgeon: Otis Brace, MD;  Location: [facility];  Service: Gastroenterology;;  . POLYPECTOMY  07/30/2019   Procedure: POLYPECTOMY;  Surgeon: Otis Brace, MD;  Location: [facility];  Service: Gastroenterology;;  . POLYPECTOMY  08/02/2019   Procedure: POLYPECTOMY;  Surgeon: Otis Brace, MD;  Location: [facility];  Service: Gastroenterology;;   Past Medical History:  Diagnosis Date  . Acute back pain with sciatica, left   . Acute back pain with sciatica, right   . AKI (acute kidney injury) ([place])   . Anemia, unspecified   . Cancer ([place])   . Carcinoid tumor of duodenum   . Chronic pain   . Chronic systolic CHF (congestive heart failure) ([place])   . Diabetes mellitus   . Esophageal reflux   . Fibromyalgia   . Gastric ulcer   . Gastroparesis   . Gout   . Hyperlipidemia   . Hypertension   . Lumbosacral stenosis   . NICM (nonischemic cardiomyopathy) ([place])   . Obesity   . PAF (paroxysmal atrial fibrillation) ([place])   . Stroke ([place]) 02/2011  . Vitamin B12 deficiency anemia    BP (!) 76/48   Pulse 94   Temp 97.8 F (36.6 C)   Ht 5\' 6"  (1.676 m)   LMP 10/10/2012   SpO2 98%   BMI 48.39 kg/m   Opioid Risk Score:   Fall Risk Score:  `1  Depression screen  PHQ 2/9  Depression screen [facility] 2/9 06/05/2020 01/04/2020 11/15/2019 11/14/2019 10/10/2019 09/20/2019 09/03/2019  Decreased Interest 1 2 3 1 3 3  0  Down, Depressed, Hopeless 1 3 3 1 3 3 3   PHQ - 2 Score 2 5 6 2 6 6 3   Altered sleeping - 3 3 - 3 3 -  Tired, decreased energy - 3 3 - 3 3 -  Change in appetite - 3 3 - 3 3 -  Feeling bad or failure about yourself  - 3 3 - 2 2 -  Trouble concentrating - 3 3 - 3 3 -  Moving slowly or fidgety/restless - 3 3 - 3 1 -  Suicidal thoughts - 0 3 - 0 1 -  PHQ-9 Score - 23 27 - 23 - -  Difficult doing work/chores - - - - - Very difficult -  Some recent data might be hidden   Review of Systems  Gastrointestinal: Positive for abdominal pain.  Musculoskeletal: Positive for  arthralgias, back pain and gait problem.  Neurological: Positive for dizziness.  All other systems reviewed and are negative.      Objective:   Physical Exam Vitals and nursing note reviewed.  Constitutional:      Appearance: Normal appearance.  Cardiovascular:     Rate and Rhythm: Normal rate and regular rhythm.     Pulses: Normal pulses.     Heart sounds: Normal heart sounds.  Pulmonary:     Effort: Pulmonary effort is normal.     Breath sounds: Normal breath sounds.  Musculoskeletal:     Cervical back: Normal range of motion and neck supple.     Comments: Normal Muscle Bulk and Muscle Testing Reveals:  Upper Extremities: Full ROM and Muscle Strength 4/5 Thoracic and Lumbar Hypersensitivity Lower Extremities: Decreased ROM and Muscle Strength 5/5 Bilateral Lower Extremity Flexion Produces Pain into her Bilateral Lower Extremities and Bilateral Feet Arrived in wheelchair   Skin:    General: Skin is warm and dry.  Neurological:     Mental Status: She is alert and oriented to person, place, and time.  Psychiatric:        Mood and Affect: Mood normal.        Behavior: Behavior normal.           Assessment & Plan:  1. 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  2. Chronic Pain Syndrome: Refilled Dilaudid 2 mg one tablet daily as needed for sever pain. #30.  We will continue the opioid monitoring program, this consists of regular clinic visits, examinations, urine drug screen, pill counts as well as use of [place] Controlled Substance Reporting system. A 12 month History has been reviewed on the [place] Controlled Substance Reporting System on 08/01/2020. 3. Hypotension: Blood Pressure was re-checked. Ms. Mandujano refuses ED evaluation. Afternoon Hydralazine will be held. She was instructed to call her PCP with blood pressure readings and Hydralazine decreased to 25 mg TID. Dr Ranell Patrick is aware of the above and  agrees with treatment plan. Continue to monitor.   F/U with Dr Ranell Patrick in 1 month

## 2020-08-01 NOTE — Patient Instructions (Signed)
The above discussed with Dr Ranell Patrick and she agrees with plan:  Do not take your second dose of Hydralazine today.   Decrease your Hydralazine to 25 mg three times a day.   Please check Your Blood Pressure prior to taking your medication, if your blood pressure remains low. Please call your Primary Doctor.

## 2020-08-08 ENCOUNTER — Other Ambulatory Visit: Payer: Self-pay | Admitting: Family Medicine

## 2020-08-08 LAB — DRUG TOX MONITOR 1 W/CONF, ORAL FLD
Amphetamines: NEGATIVE ng/mL (ref <10)
Barbiturates: NEGATIVE ng/mL (ref <10)
Benzodiazepines: NEGATIVE ng/mL (ref <0.50)
Buprenorphine: NEGATIVE ng/mL (ref <0.10)
Cocaine: NEGATIVE ng/mL (ref <5.0)
Fentanyl: NEGATIVE ng/mL (ref <0.10)
Heroin Metabolite: NEGATIVE ng/mL (ref <1.0)
MARIJUANA: NEGATIVE ng/mL (ref <2.5)
MDMA: NEGATIVE ng/mL (ref <10)
Meprobamate: NEGATIVE ng/mL (ref <2.5)
Methadone: NEGATIVE ng/mL (ref <5.0)
Nicotine Metabolite: NEGATIVE ng/mL (ref <5.0)
Opiates: NEGATIVE ng/mL (ref <2.5)
Phencyclidine: NEGATIVE ng/mL (ref <10)
Tapentadol: NEGATIVE ng/mL (ref <5.0)
Tramadol: NEGATIVE ng/mL (ref <5.0)
Zolpidem: NEGATIVE ng/mL (ref <5.0)

## 2020-08-08 LAB — DRUG TOX ALC METAB W/CON, ORAL FLD: Alcohol Metabolite: NEGATIVE ng/mL (ref <25)

## 2020-08-11 NOTE — Telephone Encounter (Signed)
Needs appointment in next 2 months.  Now on hydral 50mg  TID.

## 2020-08-11 NOTE — Telephone Encounter (Signed)
Needs appointment in next 2 months

## 2020-08-18 NOTE — Telephone Encounter (Signed)
Called patient to schedule appointment. She said she is no longer coming to [facility]. I have removed PCP.

## 2020-08-21 ENCOUNTER — Telehealth: Payer: Self-pay | Admitting: *Deleted

## 2020-08-21 NOTE — Telephone Encounter (Signed)
Oral swab for drug screen was negative for any medication.  Pt reported last dose taken was the day of the test.  I would suggest repeating test using urine only from now on.

## 2020-08-25 IMAGING — DX PORTABLE CHEST - 1 VIEW
1 series · 1 of 1 positions shown · non-contrast
Comparison: Chest CTA and radiographs 07/29/2017 and earlier.

CLINICAL DATA: 53-year-old female with chest pain and shortness of
breath.

EXAM:
PORTABLE CHEST 1 VIEW

[chest]
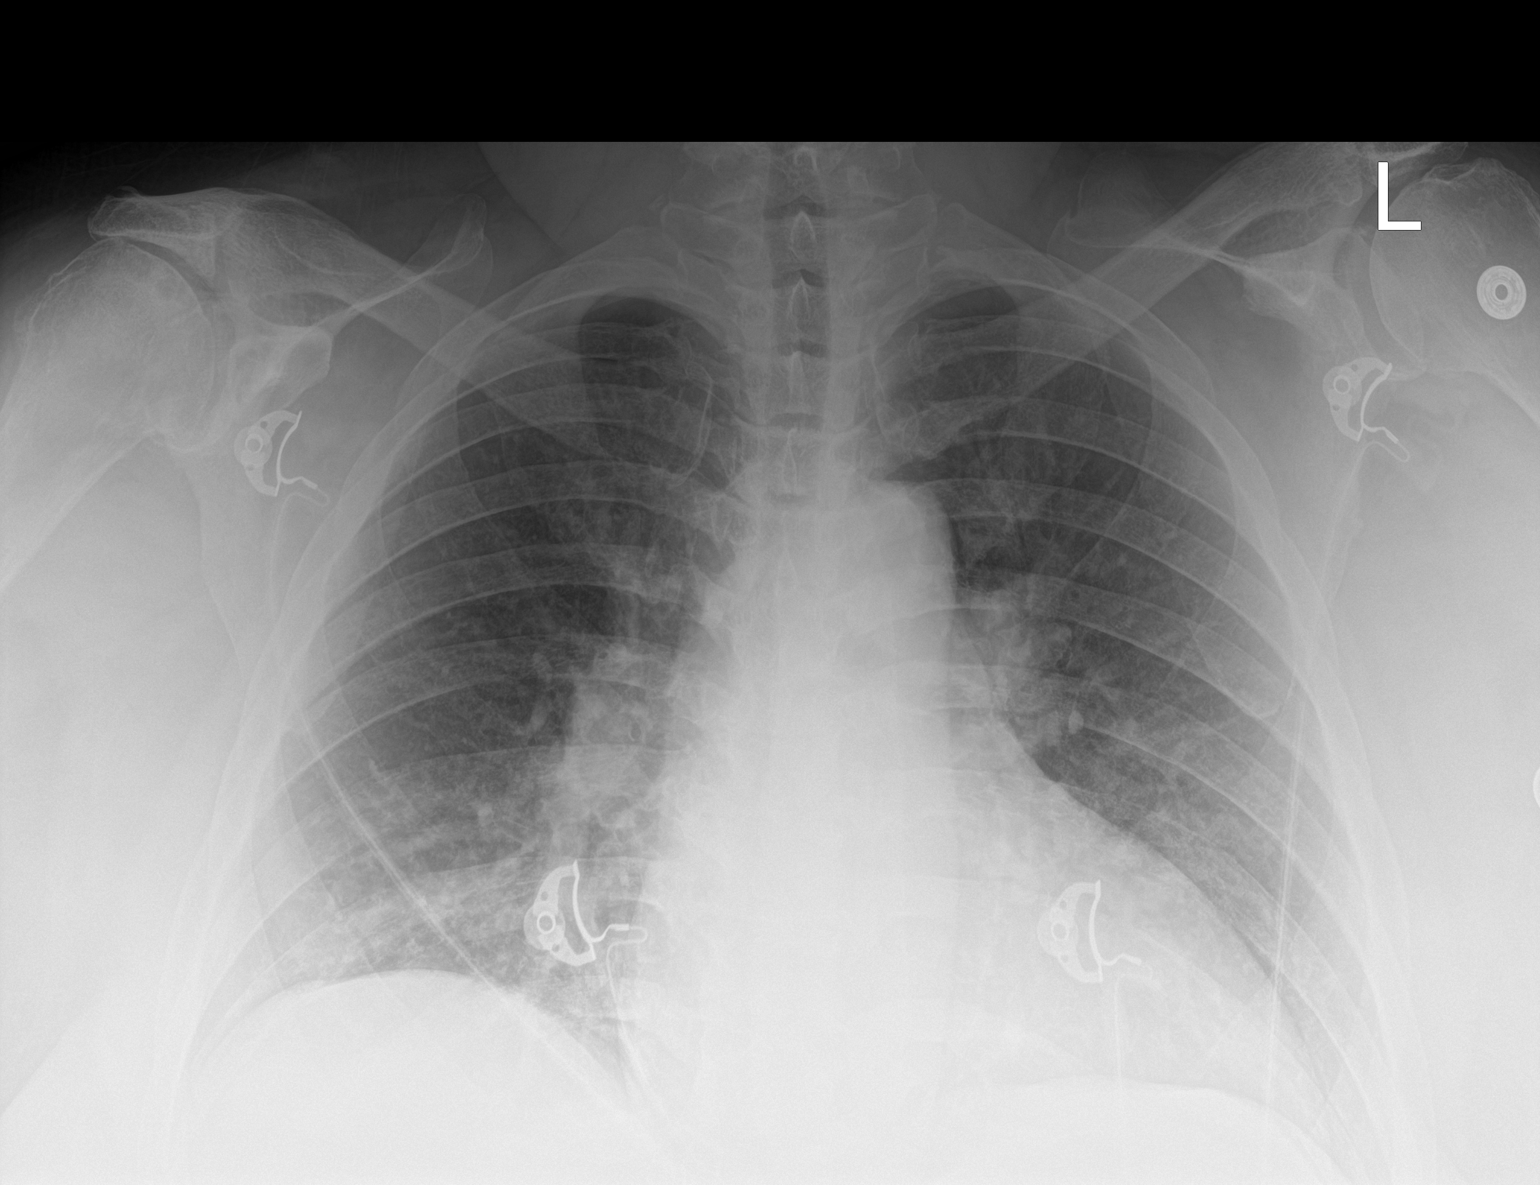

[1 of 1 positions shown; findings below may reference images not displayed]

FINDINGS: Portable AP semi upright view at 1661 hours. Mildly lower lung
volumes. Mediastinal contours remain normal. Visualized tracheal air
column is within normal limits. No pneumothorax, pulmonary edema or
pleural effusion. Mild asymmetric increased patchy right lung base
opacity. Elsewhere the lungs appear clear.

Stable visualized osseous structures.
IMPRESSION: Asymmetric mild opacity at the right lung base opacity is
nonspecific, but might simply reflect atelectasis. No pleural
effusion or other acute cardiopulmonary abnormality identified.

## 2020-08-27 ENCOUNTER — Other Ambulatory Visit: Payer: Self-pay

## 2020-08-27 ENCOUNTER — Encounter: Payer: Medicare Other | Attending: Physical Medicine and Rehabilitation | Admitting: Registered Nurse

## 2020-08-27 ENCOUNTER — Telehealth: Payer: Self-pay | Admitting: Registered Nurse

## 2020-08-27 VITALS — BP 106/61 | HR 93 | Temp 97.9°F

## 2020-08-27 DIAGNOSIS — Z79891 Long term (current) use of opiate analgesic: Secondary | ICD-10-CM

## 2020-08-27 DIAGNOSIS — G893 Neoplasm related pain (acute) (chronic): Secondary | ICD-10-CM | POA: Diagnosis present

## 2020-08-27 DIAGNOSIS — C7A010 Malignant carcinoid tumor of the duodenum: Secondary | ICD-10-CM

## 2020-08-27 DIAGNOSIS — Z5181 Encounter for therapeutic drug level monitoring: Secondary | ICD-10-CM | POA: Diagnosis not present

## 2020-08-27 DIAGNOSIS — G894 Chronic pain syndrome: Secondary | ICD-10-CM | POA: Diagnosis not present

## 2020-08-27 IMAGING — CR CHEST - 2 VIEW
2 series · 2 of 2 positions shown · non-contrast
Comparison: 07/17/2018, 07/09/2017

CLINICAL DATA: 53-year-old female with diabetes

EXAM:
CHEST - 2 VIEW

[chest lat]
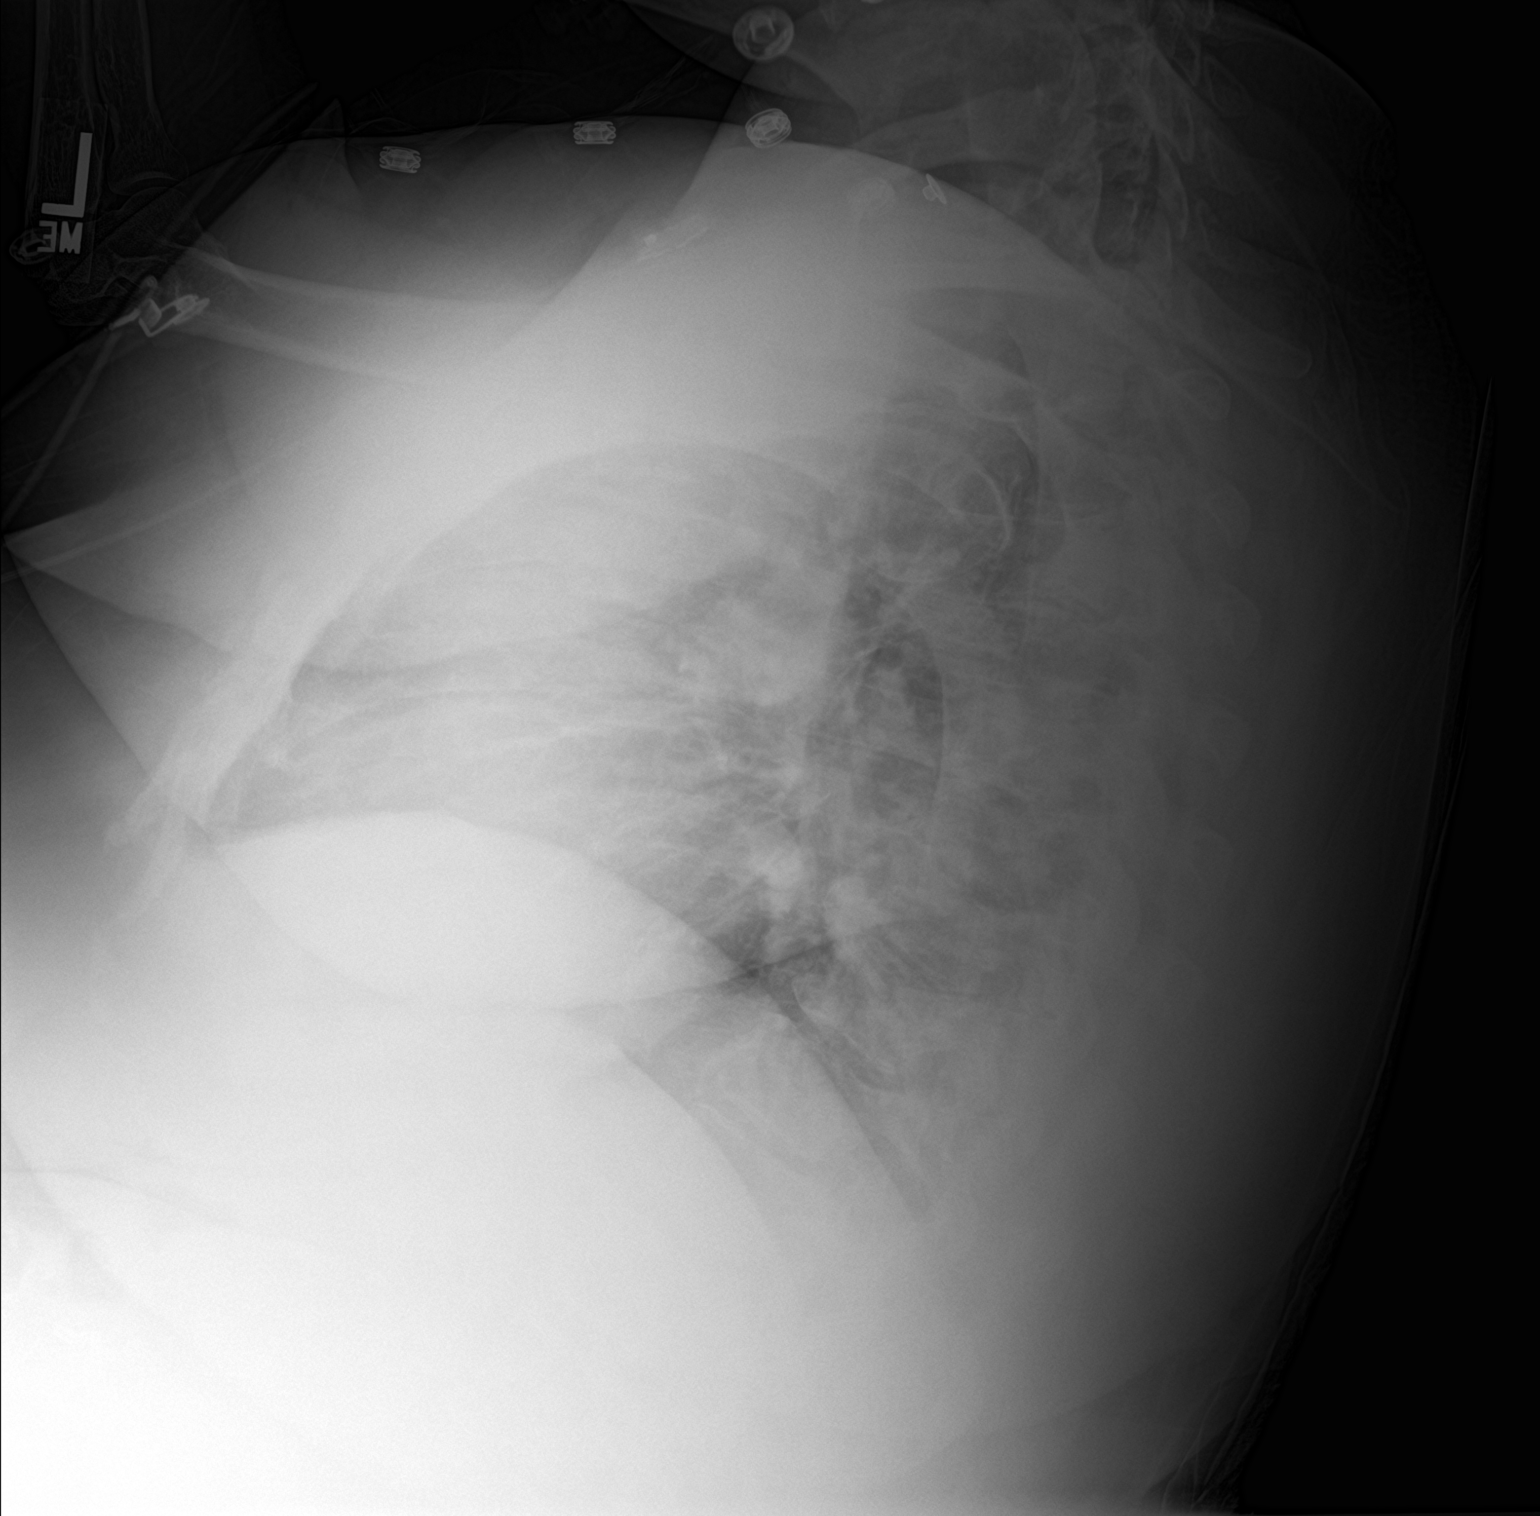

[chest ap]
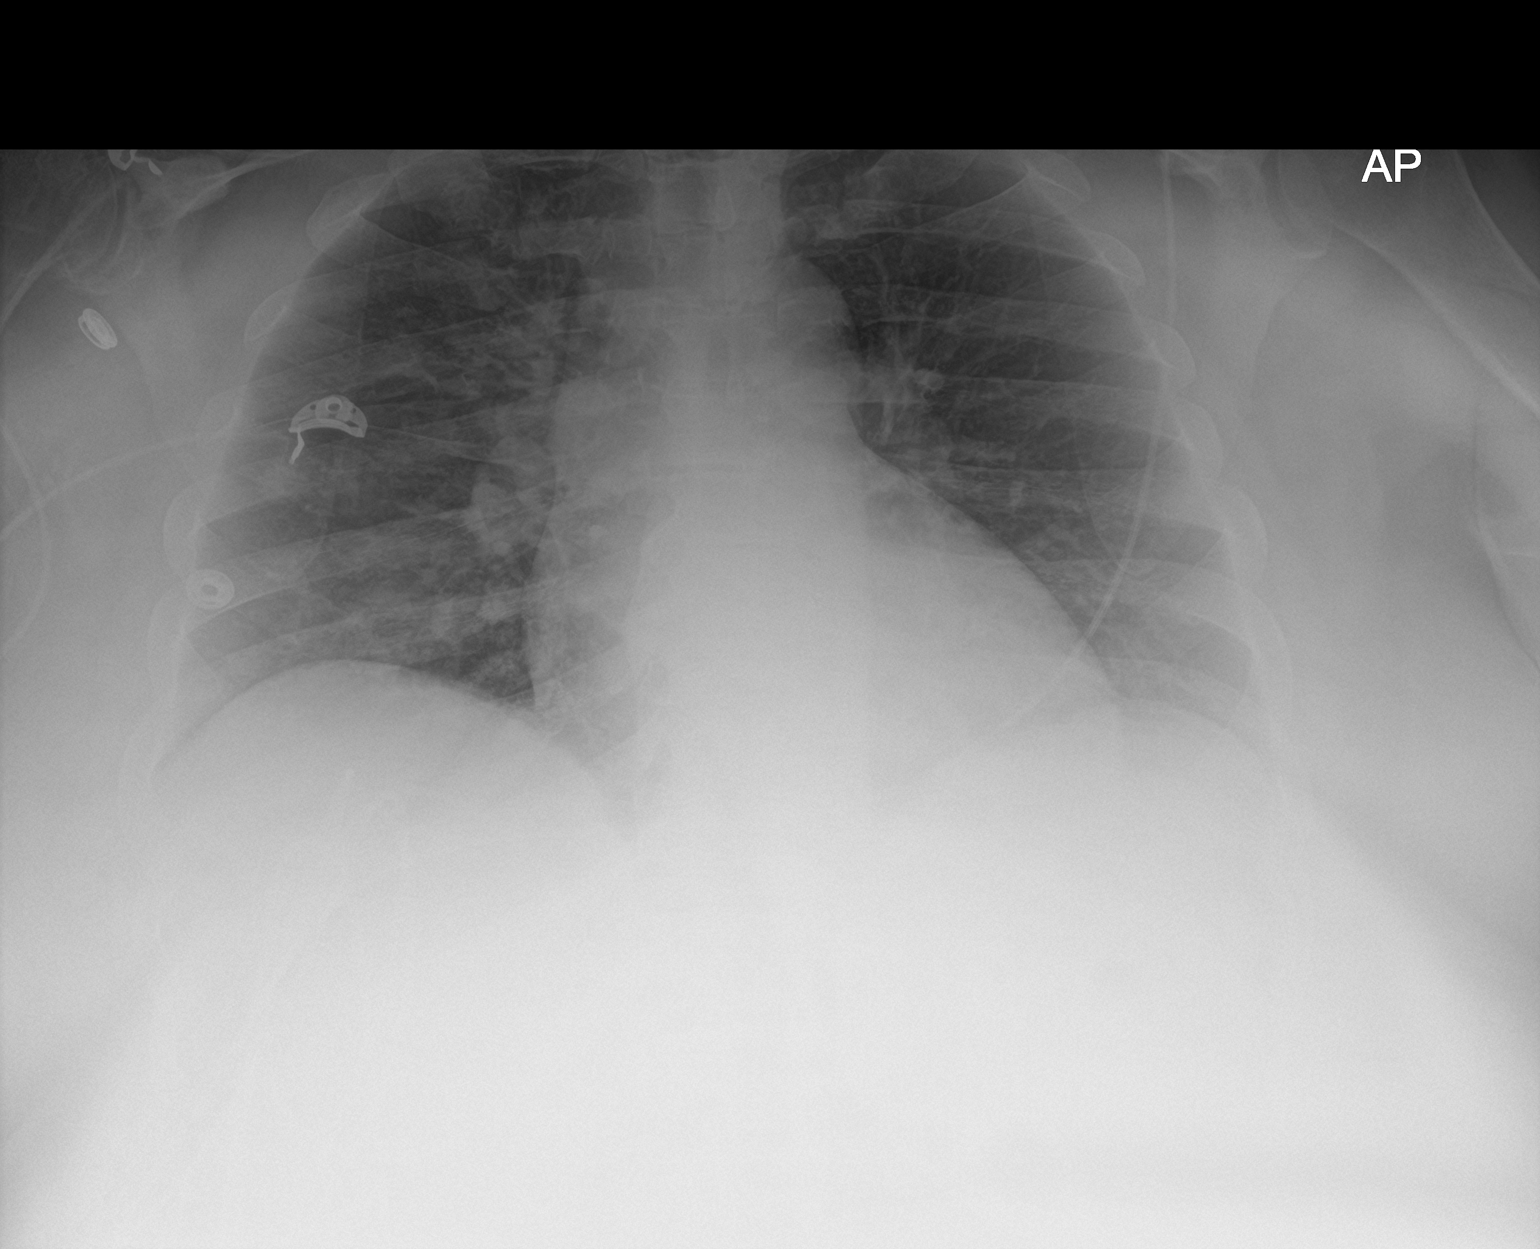

[2 of 2 positions shown; findings below may reference images not displayed]

FINDINGS: Cardiomediastinal silhouette unchanged in size and contour.

Low lung volumes persist with ill-defined airspace opacity at the
right lung base. Patchy opacities at the posterior lung base on the
lateral view. Uncertain if this represents overlying soft tissues
and atelectasis or consolidation.

No large pleural effusion.  No pneumothorax.

No displaced fracture.
IMPRESSION: Low lung volumes, with redemonstration of airspace opacity at the
right lung base, potentially atelectasis or consolidation.

## 2020-08-27 MED ORDER — HYDROMORPHONE HCL 2 MG PO TABS: 2.0000 mg | ORAL_TABLET | Freq: Every day | ORAL | 0 refills | Status: DC | PRN

## 2020-08-27 NOTE — Progress Notes (Signed)
Subjective:    Patient ID: Deborah [name], female    DOB: 15-Jun-1965, 55 y.o.   MRN: [id number]  HPI: Deborah [name] is a 55 y.o. female who returns for follow up appointment for chronic pain and medication refill. She reports generalized  pain all over. She rates her pain 10. Her current exercise regime is walking to the bathroom in her home otherwise she is in wheelchair.   Deborah [name] Morphine equivalent is 8.00  MME.   UDS ordered today.    Pain Inventory Average Pain 10 Pain Right Now 10 My pain is constant, sharp, tingling, and aching  In the last 24 hours, has pain interfered with the following? General activity 10 Relation with others 10 Enjoyment of life 10 What TIME of day is your pain at its worst? morning , daytime, evening, and night Sleep (in general) Poor  Pain is worse with: walking, bending, sitting, inactivity, standing, and some activites Pain improves with: medication Relief from Meds: 3  Family History  Problem Relation Age of Onset   Diabetes Mother    Diabetes Father    Heart disease Father    Diabetes Sister    Congestive Heart Failure Sister 3   Diabetes Brother    Social History   Socioeconomic History   Marital status: Single    Spouse name: Not on file   Number of children: Not on file   Years of education: Not on file   Highest education level: Not on file  Occupational History   Not on file  Tobacco Use   Smoking status: Never   Smokeless tobacco: Never  Vaping Use   Vaping Use: Never used  Substance and Sexual Activity   Alcohol use: No   Drug use: No   Sexual activity: Not Currently    Birth control/protection: None  Other Topics Concern   Not on file  Social History Narrative   ** Merged History Encounter **       Social Determinants of Health   Financial Resource Strain: Not on file  Food Insecurity: Not on file  Transportation Needs: Not on file  Physical Activity: Not on file  Stress: Not on file   Social Connections: Not on file   Past Surgical History:  Procedure Laterality Date   BIOPSY  07/27/2019   Procedure: BIOPSY;  Surgeon: Clarene Essex, MD;  Location: [facility];  Service: Endoscopy;;   BIOPSY  07/30/2019   Procedure: BIOPSY;  Surgeon: Otis Brace, MD;  Location: [facility];  Service: Gastroenterology;;   CATARACT EXTRACTION  01/2014   CHOLECYSTECTOMY     COLONOSCOPY WITH PROPOFOL N/A 07/30/2019   Procedure: COLONOSCOPY WITH PROPOFOL;  Surgeon: Otis Brace, MD;  Location: [facility];  Service: Gastroenterology;  Laterality: N/A;   ESOPHAGOGASTRODUODENOSCOPY N/A 07/27/2019   Procedure: ESOPHAGOGASTRODUODENOSCOPY (EGD);  Surgeon: Clarene Essex, MD;  Location: [facility];  Service: Endoscopy;  Laterality: N/A;   ESOPHAGOGASTRODUODENOSCOPY N/A 07/26/2020   Procedure: ESOPHAGOGASTRODUODENOSCOPY (EGD);  Surgeon: Arta Silence, MD;  Location: [facility];  Service: Endoscopy;  Laterality: N/A;   ESOPHAGOGASTRODUODENOSCOPY (EGD) WITH PROPOFOL N/A 08/02/2019   Procedure: ESOPHAGOGASTRODUODENOSCOPY (EGD) WITH PROPOFOL;  Surgeon: Otis Brace, MD;  Location: [facility];  Service: Gastroenterology;  Laterality: N/A;   HEMOSTASIS CLIP PLACEMENT  08/02/2019   Procedure: HEMOSTASIS CLIP PLACEMENT;  Surgeon: Otis Brace, MD;  Location: [facility];  Service: Gastroenterology;;   POLYPECTOMY  07/30/2019   Procedure: POLYPECTOMY;  Surgeon: Otis Brace, MD;  Location: [facility];  Service: Gastroenterology;;  POLYPECTOMY  08/02/2019   Procedure: POLYPECTOMY;  Surgeon: Otis Brace, MD;  Location: [facility];  Service: Gastroenterology;;   Past Surgical History:  Procedure Laterality Date   BIOPSY  07/27/2019   Procedure: BIOPSY;  Surgeon: Clarene Essex, MD;  Location: [facility];  Service: Endoscopy;;   BIOPSY  07/30/2019   Procedure: BIOPSY;  Surgeon: Otis Brace, MD;  Location: [facility];  Service: Gastroenterology;;   CATARACT EXTRACTION   01/2014   CHOLECYSTECTOMY     COLONOSCOPY WITH PROPOFOL N/A 07/30/2019   Procedure: COLONOSCOPY WITH PROPOFOL;  Surgeon: Otis Brace, MD;  Location: [facility];  Service: Gastroenterology;  Laterality: N/A;   ESOPHAGOGASTRODUODENOSCOPY N/A 07/27/2019   Procedure: ESOPHAGOGASTRODUODENOSCOPY (EGD);  Surgeon: Clarene Essex, MD;  Location: [facility];  Service: Endoscopy;  Laterality: N/A;   ESOPHAGOGASTRODUODENOSCOPY N/A 07/26/2020   Procedure: ESOPHAGOGASTRODUODENOSCOPY (EGD);  Surgeon: Arta Silence, MD;  Location: [facility];  Service: Endoscopy;  Laterality: N/A;   ESOPHAGOGASTRODUODENOSCOPY (EGD) WITH PROPOFOL N/A 08/02/2019   Procedure: ESOPHAGOGASTRODUODENOSCOPY (EGD) WITH PROPOFOL;  Surgeon: Otis Brace, MD;  Location: [facility];  Service: Gastroenterology;  Laterality: N/A;   HEMOSTASIS CLIP PLACEMENT  08/02/2019   Procedure: HEMOSTASIS CLIP PLACEMENT;  Surgeon: Otis Brace, MD;  Location: [facility];  Service: Gastroenterology;;   POLYPECTOMY  07/30/2019   Procedure: POLYPECTOMY;  Surgeon: Otis Brace, MD;  Location: [facility];  Service: Gastroenterology;;   POLYPECTOMY  08/02/2019   Procedure: POLYPECTOMY;  Surgeon: Otis Brace, MD;  Location: [facility];  Service: Gastroenterology;;   Past Medical History:  Diagnosis Date   Acute back pain with sciatica, left    Acute back pain with sciatica, right    AKI (acute kidney injury) ([place])    Anemia, unspecified    Cancer ([place])    Carcinoid tumor of duodenum    Chronic pain    Chronic systolic CHF (congestive heart failure) ([facility])    Diabetes mellitus    Esophageal reflux    Fibromyalgia    Gastric ulcer    Gastroparesis    Gout    Hyperlipidemia    Hypertension    Lumbosacral stenosis    NICM (nonischemic cardiomyopathy) ([facility])    Obesity    PAF (paroxysmal atrial fibrillation) ([place])    Stroke ([place]) 02/2011   Vitamin B12 deficiency anemia    BP 106/61   Pulse 93   Temp 97.9 F (36.6  C) (Oral)   LMP 10/10/2012   SpO2 98%   Opioid Risk Score:   Fall Risk Score:  `1  Depression screen PHQ 2/9  Depression screen [facility] 2/9 08/01/2020 06/05/2020 01/04/2020 11/15/2019 11/14/2019 10/10/2019 09/20/2019  Decreased Interest 1 1 2 3 1 3 3   Down, Depressed, Hopeless 1 1 3 3 1 3 3   PHQ - 2 Score 2 2 5 6 2 6 6   Altered sleeping - - 3 3 - 3 3  Tired, decreased energy - - 3 3 - 3 3  Change in appetite - - 3 3 - 3 3  Feeling bad or failure about yourself  - - 3 3 - 2 2  Trouble concentrating - - 3 3 - 3 3  Moving slowly or fidgety/restless - - 3 3 - 3 1  Suicidal thoughts - - 0 3 - 0 1  PHQ-9 Score - - 23 27 - 23 -  Difficult doing work/chores - - - - - - Very difficult  Some recent data might be hidden    Review of Systems  Gastrointestinal:  Positive for  abdominal pain.  Musculoskeletal:  Positive for back pain.       Bilateral hip pain Bilateral leg pain spasms  Neurological:  Positive for dizziness, tremors, weakness and numbness.       Tingling  All other systems reviewed and are negative.     Objective:   Physical Exam Vitals and nursing note reviewed.  Constitutional:      Appearance: She is ill-appearing.  Cardiovascular:     Rate and Rhythm: Normal rate and regular rhythm.     Pulses: Normal pulses.     Heart sounds: Normal heart sounds.  Pulmonary:     Effort: Pulmonary effort is normal.     Breath sounds: Normal breath sounds.  Musculoskeletal:     Cervical back: Normal range of motion and neck supple.     Comments: Normal Muscle Bulk and Muscle Testing Reveals:  Upper Extremities: Decreased ROM and Muscle Strength  4/5 Lower Extremities: Decreased ROM and Muscle Strength 4/4 Arrived in wheelchair    Skin:    General: Skin is warm and dry.  Neurological:     Mental Status: She is alert and oriented to person, place, and time.  Psychiatric:        Mood and Affect: Mood normal.        Behavior: Behavior normal.         Assessment & Plan:  1.  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 08/27/2020 2. Chronic Pain Syndrome: Refilled Dilaudid 2 mg one tablet daily as needed for sever pain. #30. A message was sent to Dr Ranell Patrick regarding Ms. Caravello being out of her medication early again this month. Dr Ranell Patrick will speak with Ms. Bradfield regarding the above on her next scheduled appointment. 08/27/2020 We will continue the opioid monitoring program, this consists of regular clinic visits, examinations, urine drug screen, pill counts as well as use of [place] Controlled Substance Reporting system. A 12 month History has been reviewed on the [place] Controlled Substance Reporting System on 08/27/2020.   F/U with Dr Ranell Patrick in 1 month

## 2020-08-27 NOTE — Telephone Encounter (Signed)
Dr Ranell Patrick, [name] has an appointment with you in a week. She is out of her medication early again, we obtain a UDS today. This will need to be addressed when  you see her. She seems to be in pain, she is your patient, and I will await your decision.

## 2020-08-28 ENCOUNTER — Emergency Department (HOSPITAL_COMMUNITY): Payer: Medicare Other

## 2020-08-28 ENCOUNTER — Emergency Department (HOSPITAL_COMMUNITY)
Admission: EM | Admit: 2020-08-28 | Discharge: 2020-08-28 | Disposition: A | Discharge status: Home / Self Care | Payer: Medicare Other | Attending: Emergency Medicine | Admitting: Emergency Medicine

## 2020-08-28 ENCOUNTER — Encounter (HOSPITAL_COMMUNITY): Payer: Self-pay

## 2020-08-28 DIAGNOSIS — J441 Chronic obstructive pulmonary disease with (acute) exacerbation: Secondary | ICD-10-CM | POA: Diagnosis not present

## 2020-08-28 DIAGNOSIS — Z794 Long term (current) use of insulin: Secondary | ICD-10-CM | POA: Insufficient documentation

## 2020-08-28 DIAGNOSIS — E1140 Type 2 diabetes mellitus with diabetic neuropathy, unspecified: Secondary | ICD-10-CM | POA: Diagnosis not present

## 2020-08-28 DIAGNOSIS — Z7901 Long term (current) use of anticoagulants: Secondary | ICD-10-CM | POA: Insufficient documentation

## 2020-08-28 DIAGNOSIS — I130 Hypertensive heart and chronic kidney disease with heart failure and stage 1 through stage 4 chronic kidney disease, or unspecified chronic kidney disease: Secondary | ICD-10-CM | POA: Diagnosis not present

## 2020-08-28 DIAGNOSIS — E1165 Type 2 diabetes mellitus with hyperglycemia: Secondary | ICD-10-CM

## 2020-08-28 DIAGNOSIS — Z7982 Long term (current) use of aspirin: Secondary | ICD-10-CM | POA: Insufficient documentation

## 2020-08-28 DIAGNOSIS — I5022 Chronic systolic (congestive) heart failure: Secondary | ICD-10-CM | POA: Insufficient documentation

## 2020-08-28 DIAGNOSIS — Z79899 Other long term (current) drug therapy: Secondary | ICD-10-CM | POA: Diagnosis not present

## 2020-08-28 DIAGNOSIS — Z20822 Contact with and (suspected) exposure to covid-19: Secondary | ICD-10-CM | POA: Diagnosis not present

## 2020-08-28 DIAGNOSIS — E1122 Type 2 diabetes mellitus with diabetic chronic kidney disease: Secondary | ICD-10-CM | POA: Diagnosis not present

## 2020-08-28 DIAGNOSIS — R202 Paresthesia of skin: Secondary | ICD-10-CM | POA: Diagnosis not present

## 2020-08-28 DIAGNOSIS — R000 Tachycardia, unspecified: Secondary | ICD-10-CM | POA: Insufficient documentation

## 2020-08-28 DIAGNOSIS — R109 Unspecified abdominal pain: Secondary | ICD-10-CM | POA: Diagnosis not present

## 2020-08-28 DIAGNOSIS — E1143 Type 2 diabetes mellitus with diabetic autonomic (poly)neuropathy: Secondary | ICD-10-CM | POA: Diagnosis not present

## 2020-08-28 DIAGNOSIS — N182 Chronic kidney disease, stage 2 (mild): Secondary | ICD-10-CM | POA: Diagnosis not present

## 2020-08-28 DIAGNOSIS — K3184 Gastroparesis: Secondary | ICD-10-CM

## 2020-08-28 DIAGNOSIS — R0602 Shortness of breath: Secondary | ICD-10-CM | POA: Diagnosis present

## 2020-08-28 DIAGNOSIS — IMO0002 Reserved for concepts with insufficient information to code with codable children: Secondary | ICD-10-CM

## 2020-08-28 LAB — COMPREHENSIVE METABOLIC PANEL
ALT: 13 U/L (ref 0–44)
AST: 26 U/L (ref 15–41)
Albumin: 3.3 g/dL — ABNORMAL LOW (ref 3.5–5.0)
Alkaline Phosphatase: 81 U/L (ref 38–126)
Anion gap: 16 — ABNORMAL HIGH (ref 5–15)
BUN: 59 mg/dL — ABNORMAL HIGH (ref 6–20)
CO2: 20 mmol/L — ABNORMAL LOW (ref 22–32)
Calcium: 7.1 mg/dL — ABNORMAL LOW (ref 8.9–10.3)
Chloride: 98 mmol/L (ref 98–111)
Creatinine, Ser: 2.44 mg/dL — ABNORMAL HIGH (ref 0.44–1.00)
GFR, Estimated: 23 mL/min — ABNORMAL LOW (ref >60)
Glucose, Bld: 283 mg/dL — ABNORMAL HIGH (ref 70–99)
Potassium: 3.3 mmol/L — ABNORMAL LOW (ref 3.5–5.1)
Sodium: 134 mmol/L — ABNORMAL LOW (ref 135–145)
Total Bilirubin: 2 mg/dL — ABNORMAL HIGH (ref 0.3–1.2)
Total Protein: 8.7 g/dL — ABNORMAL HIGH (ref 6.5–8.1)

## 2020-08-28 LAB — CBC WITH DIFFERENTIAL/PLATELET
Abs Immature Granulocytes: 0.12 10*3/uL — ABNORMAL HIGH (ref 0.00–0.07)
Basophils Absolute: 0.1 10*3/uL (ref 0.0–0.1)
Basophils Relative: 1 %
Eosinophils Absolute: 0.1 10*3/uL (ref 0.0–0.5)
Eosinophils Relative: 1 %
HCT: 24.6 % — ABNORMAL LOW (ref 36.0–46.0)
Hemoglobin: 7.8 g/dL — ABNORMAL LOW (ref 12.0–15.0)
Immature Granulocytes: 1 %
Lymphocytes Relative: 11 %
Lymphs Abs: 1.7 10*3/uL (ref 0.7–4.0)
MCH: 27.9 pg (ref 26.0–34.0)
MCHC: 31.7 g/dL (ref 30.0–36.0)
MCV: 87.9 fL (ref 80.0–100.0)
Monocytes Absolute: 0.9 10*3/uL (ref 0.1–1.0)
Monocytes Relative: 6 %
Neutro Abs: 11.8 10*3/uL — ABNORMAL HIGH (ref 1.7–7.7)
Neutrophils Relative %: 80 %
Platelets: 492 10*3/uL — ABNORMAL HIGH (ref 150–400)
RBC: 2.8 MIL/uL — ABNORMAL LOW (ref 3.87–5.11)
RDW: 14.9 % (ref 11.5–15.5)
WBC: 14.6 10*3/uL — ABNORMAL HIGH (ref 4.0–10.5)
nRBC: 0 % (ref 0.0–0.2)

## 2020-08-28 LAB — BRAIN NATRIURETIC PEPTIDE: B Natriuretic Peptide: 43.7 pg/mL (ref 0.0–100.0)

## 2020-08-28 LAB — RESP PANEL BY RT-PCR (FLU A&B, COVID) ARPGX2
Influenza A by PCR: NEGATIVE
Influenza B by PCR: NEGATIVE
SARS Coronavirus 2 by RT PCR: NEGATIVE

## 2020-08-28 LAB — D-DIMER, QUANTITATIVE: D-Dimer, Quant: 3.15 ug/mL-FEU — ABNORMAL HIGH (ref 0.00–0.50)

## 2020-08-28 LAB — LACTIC ACID, PLASMA
Lactic Acid, Venous: 1.4 mmol/L (ref 0.5–1.9)
Lactic Acid, Venous: 0.9 mmol/L (ref 0.5–1.9)

## 2020-08-28 LAB — TROPONIN I (HIGH SENSITIVITY)
Troponin I (High Sensitivity): 23 ng/L — ABNORMAL HIGH (ref <18)
Troponin I (High Sensitivity): 25 ng/L — ABNORMAL HIGH (ref <18)

## 2020-08-28 MED ORDER — HYDROMORPHONE HCL 2 MG/ML IJ SOLN
2.0000 mg | Freq: Once | INTRAMUSCULAR | Status: AC
  Administered 2020-08-28: 2 mg via INTRAMUSCULAR
  Filled 2020-08-28: qty 1

## 2020-08-28 MED ORDER — HYDROMORPHONE HCL 2 MG/ML IJ SOLN
2.0000 mg | Freq: Once | INTRAMUSCULAR | Status: AC
Start: 2020-08-28 — End: 2020-08-28
  Administered 2020-08-28: 2 mg via INTRAVENOUS
  Filled 2020-08-28: qty 1

## 2020-08-28 MED ORDER — HYDROCORTISONE NA SUCCINATE PF 250 MG IJ SOLR
200.0000 mg | Freq: Once | INTRAMUSCULAR | Status: AC
  Administered 2020-08-28: 200 mg via INTRAVENOUS
  Filled 2020-08-28: qty 200

## 2020-08-28 MED ORDER — SODIUM CHLORIDE 0.9 % IV BOLUS
500.0000 mL | Freq: Once | INTRAVENOUS | Status: AC
Start: 2020-08-28 — End: 2020-08-28
  Administered 2020-08-28: 500 mL via INTRAVENOUS

## 2020-08-28 MED ORDER — HYDROMORPHONE HCL 1 MG/ML IJ SOLN
1.0000 mg | Freq: Once | INTRAMUSCULAR | Status: AC
  Administered 2020-08-28: 1 mg via INTRAVENOUS
  Filled 2020-08-28: qty 1

## 2020-08-28 MED ORDER — DIPHENHYDRAMINE HCL 50 MG/ML IJ SOLN
50.0000 mg | Freq: Once | INTRAMUSCULAR | Status: AC
  Administered 2020-08-28: 50 mg via INTRAVENOUS
  Filled 2020-08-28: qty 1

## 2020-08-28 MED ORDER — DIPHENHYDRAMINE HCL 25 MG PO CAPS: 50.0000 mg | ORAL_CAPSULE | Freq: Once | ORAL | Status: AC

## 2020-08-28 MED ORDER — TECHNETIUM TO 99M ALBUMIN AGGREGATED
4.3000 | Freq: Once | INTRAVENOUS | Status: AC
  Administered 2020-08-28: 4.3 via INTRAVENOUS

## 2020-08-28 NOTE — ED Provider Notes (Signed)
Deborah [name]   [name]: [id number] Arrival date & time: 08/28/20  2637     History Chief Complaint  Patient presents with   [name]    Deborah [name] is a 55 y.o. female.  The history is provided by the patient.  Chest Pain Pain location:  R chest Pain quality: stabbing   Pain radiates to:  Does not radiate Pain severity:  Severe Onset quality:  Sudden Duration:  4 hours Timing:  Constant Progression:  Unchanged Chronicity:  New Context: at rest   Relieved by:  Nothing Worsened by:  Deep breathing Ineffective treatments: dilaudid tablets. Associated symptoms: abdominal pain (right-sided), nausea, numbness (entire right side) and shortness of breath   Associated symptoms: no back pain, no cough, no fever, no lower extremity edema, no palpitations and no vomiting       Past Medical History:  Diagnosis Date   Acute back pain with sciatica, left    Acute back pain with sciatica, right    AKI (acute kidney injury) ([place])    Anemia, unspecified    Cancer ([place])    Carcinoid tumor of duodenum    Chronic pain    Chronic systolic CHF (congestive heart failure) (HCC)    Diabetes mellitus    Esophageal reflux    Fibromyalgia    Gastric ulcer    Gastroparesis    Gout    Hyperlipidemia    Hypertension    Lumbosacral stenosis    NICM (nonischemic cardiomyopathy) (HCC)    Obesity    PAF (paroxysmal atrial fibrillation) ([place])    Stroke ([place]) 02/2011   Vitamin B12 deficiency anemia     Patient Active Problem List   Diagnosis Date Noted   DKA (diabetic ketoacidosis) ([place]) 07/21/2020   ARF (acute renal failure) ([place]) 07/21/2020   Vaginal yeast infection 01/04/2020   AKI (acute kidney injury) ([place]) 11/27/2019   Chronic pain 09/04/2019   Malignant carcinoid tumor of duodenum ([place])    Nonalcoholic fatty liver disease 06/05/2019   Chronic diarrhea    COPD exacerbation ([place]) 05/08/2019   Acute on chronic  HFrEF (heart failure with reduced ejection fraction) ([employer])    Restrictive lung disease secondary to obesity    History of gastric ulcer    Uncontrolled type 2 diabetes mellitus with hyperglycemia ([place])    Fibroid uterus 85/88/5027   Chronic systolic CHF (congestive heart failure) ([place]) 12/19/2018   Intractable nausea and vomiting 12/17/2018   Hypoxia 12/17/2018   Abdominal pain 07/17/2018   Cardiac arrest ([place]) 11/29/2017   Leukocytosis 11/29/2017   Anxiety 11/29/2017   Stroke (cerebrum) ([place]) 03/19/2017   GERD (gastroesophageal reflux disease) 03/19/2017   Depression 03/19/2017   Morbid obesity ([place])    Normocytic normochromic anemia 08/16/2016   Diabetic gastroparesis ([place]) 06/05/2016   Gout 06/05/2016   Chest pain 09/26/2015   Hypokalemia 09/26/2015   Hypomagnesemia 09/26/2015   Nausea and vomiting 08/20/2015   DKA (diabetic ketoacidoses) 05/25/2015   Uncontrolled type 2 diabetes mellitus with diabetic neuropathy, with long-term current use of insulin ([place]) 05/25/2015   Type 2 diabetes mellitus with hyperglycemia, with long-term current use of insulin ([place]) 05/25/2015   CKD (chronic kidney disease), stage II 05/25/2015   Essential hypertension, benign 09/28/2013    Past Surgical History:  Procedure Laterality Date   BIOPSY  07/27/2019   Procedure: BIOPSY;  Surgeon: Clarene Essex, MD;  Location: [facility];  Service: Endoscopy;;   BIOPSY  07/30/2019   Procedure: BIOPSY;  Surgeon: Otis Brace, MD;  Location: [facility];  Service: Gastroenterology;;   CATARACT EXTRACTION  01/2014   CHOLECYSTECTOMY     COLONOSCOPY WITH PROPOFOL N/A 07/30/2019   Procedure: COLONOSCOPY WITH PROPOFOL;  Surgeon: Otis Brace, MD;  Location: [facility];  Service: Gastroenterology;  Laterality: N/A;   ESOPHAGOGASTRODUODENOSCOPY N/A 07/27/2019   Procedure: ESOPHAGOGASTRODUODENOSCOPY (EGD);  Surgeon: Clarene Essex, MD;  Location: [facility];  Service: Endoscopy;  Laterality: N/A;    ESOPHAGOGASTRODUODENOSCOPY N/A 07/26/2020   Procedure: ESOPHAGOGASTRODUODENOSCOPY (EGD);  Surgeon: Arta Silence, MD;  Location: [facility];  Service: Endoscopy;  Laterality: N/A;   ESOPHAGOGASTRODUODENOSCOPY (EGD) WITH PROPOFOL N/A 08/02/2019   Procedure: ESOPHAGOGASTRODUODENOSCOPY (EGD) WITH PROPOFOL;  Surgeon: Otis Brace, MD;  Location: [facility];  Service: Gastroenterology;  Laterality: N/A;   HEMOSTASIS CLIP PLACEMENT  08/02/2019   Procedure: HEMOSTASIS CLIP PLACEMENT;  Surgeon: Otis Brace, MD;  Location: [facility];  Service: Gastroenterology;;   POLYPECTOMY  07/30/2019   Procedure: POLYPECTOMY;  Surgeon: Otis Brace, MD;  Location: [facility];  Service: Gastroenterology;;   POLYPECTOMY  08/02/2019   Procedure: POLYPECTOMY;  Surgeon: Otis Brace, MD;  Location: [facility];  Service: Gastroenterology;;     OB History     Gravida  0   Para  0   Term  0   Preterm  0   AB  0   Living  0      SAB  0   IAB  0   Ectopic  0   Multiple  0   Live Births  0           Family History  Problem Relation Age of Onset   Diabetes Mother    Diabetes Father    Heart disease Father    Diabetes Sister    Congestive Heart Failure Sister 57   Diabetes Brother     Social History   Tobacco Use   Smoking status: Never   Smokeless tobacco: Never  Vaping Use   Vaping Use: Never used  Substance Use Topics   Alcohol use: No   Drug use: No    Home Medications Prior to Admission medications   Medication Sig Start Date End Date Taking? Authorizing Provider  albuterol (PROVENTIL) (2.5 MG/3ML) 0.083% nebulizer solution Take 3 mLs (2.5 mg total) by nebulization every 6 (six) hours as needed for wheezing or shortness of breath. 04/06/19   Scot Jun, FNP  allopurinol (ZYLOPRIM) 100 MG tablet Take 100 mg by mouth daily.    [provider]  ALPRAZolam Duanne Moron) 1 MG tablet Take 1 tablet (1 mg total) by mouth 2 (two) times daily as  needed for anxiety. 07/27/20   Shelly Coss, MD  ASPIRIN LOW DOSE 81 MG EC tablet TAKE ONE TABLET BY MOUTH ONCE DAILY (AM) 08/11/20   Meccariello, Bernita Raisin, DO  atorvastatin (LIPITOR) 10 MG tablet TAKE ONE TABLET BY MOUTH ONCE DAILY (AM) Patient taking differently: Take 10 mg by mouth daily. 11/14/19   Meccariello, Bernita Raisin, DO  blood glucose meter kit and supplies KIT Dispense based on patient and insurance preference. Use up to four times daily as directed. (FOR ICD-9 250.00, 250.01). Patient taking differently: Inject 1 each into the skin See admin instructions. Dispense based on patient and insurance preference. Use up to four times daily as directed. (FOR ICD-9 250.00, 250.01). 12/13/18   [name], DO  DULoxetine HCl 40 MG CPEP TAKE ONE CAPSULE BY MOUTH TWICE DAILY 08/11/20   Meccariello, Bernita Raisin, DO  EASY COMFORT  PEN NEEDLES 31G X 5 MM MISC USE 3 TIMES A DAY FOR INSULIN ADMINISTRATION Patient taking differently: 1 each by Other route 3 (three) times daily. 11/14/19   Meccariello, Mel Almond J, DO  ELIQUIS 5 MG TABS tablet TAKE 1 TABLET BY MOUTH 2 (TWO) TIMES DAILY. Patient taking differently: Take 5 mg by mouth 2 (two) times daily. 12/20/19   Meccariello, Bernita Raisin, DO  FARXIGA 10 MG TABS tablet Take 10 mg by mouth daily. 10/02/19   [provider]  ferrous sulfate 325 (65 FE) MG tablet Take 1 tablet (325 mg total) by mouth daily with breakfast. 07/27/20 08/26/20  Shelly Coss, MD  fluticasone (FLONASE) 50 MCG/ACT nasal spray Place 2 sprays into both nostrils daily as needed for allergies or rhinitis. 12/19/18   Rai, Vernelle Emerald, MD  hydrALAZINE (APRESOLINE) 50 MG tablet Take 1 tablet (50 mg total) by mouth 3 (three) times daily. 07/27/20   Shelly Coss, MD  HYDROmorphone (DILAUDID) 2 MG tablet Take 1 tablet (2 mg total) by mouth daily as needed for severe pain. 08/27/20   Bayard Hugger, NP  hydrOXYzine (ATARAX/VISTARIL) 10 MG tablet Take 1 tablet (10 mg total) by mouth 3 (three) times  daily as needed. 11/15/19   Meccariello, Bernita Raisin, DO  isosorbide mononitrate (IMDUR) 60 MG 24 hr tablet TAKE 1 TABLET (60 MG TOTAL) BY MOUTH DAILY. 07/01/20   Meccariello, Bernita Raisin, DO  LANTUS SOLOSTAR 100 UNIT/ML Solostar Pen Inject 32 Units into the skin at bedtime. Patient taking differently: Inject 40 Units into the skin at bedtime. 01/28/20 02/29/20  Raiford Noble Latif, DO  melatonin 3 MG TABS tablet TAKE 2 TABLETS (6 MG TOTAL) BY MOUTH AT BEDTIME. 08/11/20   Meccariello, Bernita Raisin, DO  metoprolol succinate (TOPROL-XL) 50 MG 24 hr tablet Take 1 tablet (50 mg total) by mouth daily. Take with or immediately following a meal. 07/28/20   Shelly Coss, MD  nitroGLYCERIN (NITROSTAT) 0.4 MG SL tablet Place 1 tablet (0.4 mg total) under the tongue every 5 (five) minutes as needed for chest pain. 12/13/18   Lockamy, Timothy, DO  NOVOLOG FLEXPEN 100 UNIT/ML FlexPen Inject 30 Units into the skin in the morning, at noon, in the evening, and at bedtime. 02/04/20   Meccariello, Bernita Raisin, DO  ondansetron (ZOFRAN) 4 MG tablet Take 1 tablet (4 mg total) by mouth every 6 (six) hours as needed for nausea. 07/01/20   Raulkar, Clide Deutscher, MD  pantoprazole (PROTONIX) 40 MG tablet Take 1 tablet (40 mg total) by mouth 2 (two) times daily. 08/11/20   Meccariello, Bernita Raisin, DO  potassium chloride 20 MEQ TBCR Take 20 mEq by mouth daily. 07/28/20 08/27/20  Shelly Coss, MD  promethazine (PHENERGAN) 12.5 MG tablet Take 1 tablet (12.5 mg total) by mouth every 6 (six) hours as needed for nausea or vomiting. 07/27/20   Shelly Coss, MD  torsemide (DEMADEX) 20 MG tablet Take 2 tablets (40 mg total) by mouth daily. 07/27/20   Shelly Coss, MD    Allergies    Diazepam, Gabapentin, Iodinated diagnostic agents, Isovue [iopamidol], Lisinopril, Metoclopramide, Nsaids, Penicillins, Tolmetin, Aspirin, Cyclobenzaprine, Rifamycins, Acetaminophen, and Tramadol  Review of Systems   Review of Systems  Constitutional:  Negative for chills  and fever.  HENT:  Negative for ear pain and sore throat.   Eyes:  Negative for pain and visual disturbance.  Respiratory:  Positive for shortness of breath. Negative for cough.   Cardiovascular:  Positive for chest pain. Negative for palpitations.  Gastrointestinal:  Positive for abdominal pain (right-sided) and nausea. Negative for vomiting.  Genitourinary:  Negative for dysuria and hematuria.  Musculoskeletal:  Negative for arthralgias and back pain.  Skin:  Negative for color change and rash.  Neurological:  Positive for numbness (entire right side). Negative for seizures and syncope.  All other systems reviewed and are negative.  Physical Exam Updated Vital Signs BP (!) 155/97 (BP Location: Right Arm)   Pulse (!) 113   Temp (!) 97.3 F (36.3 C) (Oral)   Resp (!) 28   LMP 10/10/2012   SpO2 100%   Physical Exam Vitals and nursing [name] reviewed.  Constitutional:      General: She is in acute distress.     Appearance: She is well-developed.     Comments: Writhing in bed, moaning  HENT:     Head: Normocephalic and atraumatic.  Eyes:     Conjunctiva/sclera: Conjunctivae normal.  Cardiovascular:     Rate and Rhythm: Regular rhythm. Tachycardia present.     Heart sounds: No murmur heard. Pulmonary:     Effort: Pulmonary effort is normal. No respiratory distress.     Breath sounds: Normal breath sounds.  Abdominal:     Palpations: Abdomen is soft.     Tenderness: There is abdominal tenderness.     Comments: Right upper and lower quadrant tenderness.  Musculoskeletal:     Cervical back: Neck supple.  Skin:    General: Skin is warm and dry.  Neurological:     General: No focal deficit present.     Mental Status: She is alert.    ED Results / Procedures / Treatments   Labs (all labs ordered are listed, but only abnormal results are displayed) Labs Reviewed  D-DIMER, QUANTITATIVE - Abnormal; Notable for the following components:      Result Value   D-Dimer, Quant 3.15  (*)    All other components within normal limits  CBC WITH DIFFERENTIAL/PLATELET - Abnormal; Notable for the following components:   WBC 14.6 (*)    RBC 2.80 (*)    Hemoglobin 7.8 (*)    HCT 24.6 (*)    Platelets 492 (*)    Neutro Abs 11.8 (*)    Abs Immature Granulocytes 0.12 (*)    All other components within normal limits  COMPREHENSIVE METABOLIC PANEL - Abnormal; Notable for the following components:   Sodium 134 (*)    Potassium 3.3 (*)    CO2 20 (*)    Glucose, Bld 283 (*)    BUN 59 (*)    Creatinine, Ser 2.44 (*)    Calcium 7.1 (*)    Total Protein 8.7 (*)    Albumin 3.3 (*)    Total Bilirubin 2.0 (*)    GFR, Estimated 23 (*)    Anion gap 16 (*)    All other components within normal limits  TROPONIN I (HIGH SENSITIVITY) - Abnormal; Notable for the following components:   Troponin I (High Sensitivity) 23 (*)    All other components within normal limits  TROPONIN I (HIGH SENSITIVITY) - Abnormal; Notable for the following components:   Troponin I (High Sensitivity) 25 (*)    All other components within normal limits  RESP PANEL BY RT-PCR (FLU A&B, COVID) ARPGX2  BRAIN NATRIURETIC PEPTIDE  LACTIC ACID, PLASMA  LACTIC ACID, PLASMA    EKG EKG Interpretation  Date/Time:  Thursday August 28 2020 06:16:54 EDT Ventricular Rate:  114 PR Interval:  167 QRS Duration: 82 QT Interval:  352 QTC  Calculation: 485 R Axis:   -32 Text Interpretation: Sinus tachycardia Atrial premature complex Left axis deviation No significant change was found Confirmed by Shanon Rosser [phone]) on 08/28/2020 6:50:56 AM  Radiology CT ABDOMEN PELVIS WO CONTRAST  Result Date: 08/28/2020 CLINICAL DATA:  [name]. Clinical suspicion for bowel obstruction. EXAM: CT ABDOMEN AND PELVIS WITHOUT CONTRAST TECHNIQUE: Multidetector CT imaging of the abdomen and pelvis was performed following the standard protocol without IV contrast. COMPARISON:  07/22/2020. FINDINGS: Lower chest: Linear opacities in  the dependent lower lobes consistent with subsegmental atelectasis. Hepatobiliary: No focal liver abnormality is seen. Status post cholecystectomy. No biliary dilatation. Pancreas: Unremarkable. No pancreatic ductal dilatation or surrounding inflammatory changes. Spleen: Normal in size without focal abnormality. Adrenals/Urinary Tract: Adrenal glands are unremarkable. Kidneys are normal, without renal calculi, focal lesion, or hydronephrosis. Bladder is unremarkable. Stomach/Bowel: Normal stomach. Small bowel and colon are normal in caliber. No wall thickening. No inflammation. Normal appendix. Vascular/Lymphatic: No significant vascular findings are present. No enlarged abdominal or pelvic lymph nodes. Reproductive: Stable, 4.7 cm pedunculated uterine fibroid projecting from the left uterine fundus. No ovarian/adnexal masses. Other: No abdominal wall hernia or abnormality. No abdominopelvic ascites. Musculoskeletal: No fracture or acute finding.  No bone lesion. IMPRESSION: 1. No acute findings.  No bowel obstruction or inflammation. Electronically Signed   By: Lajean Manes M.D.   On: 08/28/2020 12:24   DG Chest 2 View  Result Date: 08/28/2020 CLINICAL DATA:  [name]. EXAM: CHEST - 2 VIEW COMPARISON:  07/23/2020. FINDINGS: Mediastinum and hilar structures normal. Cardiomegaly. No pulmonary venous congestion. No focal infiltrate. No pleural effusion or pneumothorax. Degenerative changes both shoulders. No acute bony abnormality. IMPRESSION: Cardiomegaly.  No pulmonary venous congestion. 2.  No acute pulmonary disease. Electronically Signed   By: Marcello Moores  Register   On: 08/28/2020 07:21   NM Pulmonary Perfusion  Result Date: 08/28/2020 CLINICAL DATA:  Positive D-dimer and shortness of breath EXAM: NUCLEAR MEDICINE PERFUSION LUNG SCAN TECHNIQUE: Perfusion images were obtained in multiple projections after intravenous injection of radiopharmaceutical. Ventilation scans intentionally deferred if  perfusion scan and chest x-ray adequate for interpretation during COVID 19 epidemic. RADIOPHARMACEUTICALS:  4.3 mCi Tc-79mMAA IV COMPARISON:  Chest x-ray from earlier in the same day. FINDINGS: Perfusion images demonstrate no focal wedge defects to suggest pulmonary embolism. IMPRESSION: No evidence of pulmonary embolism. Electronically Signed   By: MInez CatalinaM.D.   On: 08/28/2020 15:23    Procedures Procedures   Medications Ordered in ED Medications  sodium chloride 0.9 % bolus 500 mL (has no administration in time range)  HYDROmorphone (DILAUDID) injection 2 mg (has no administration in time range)    ED Course  I have reviewed the triage vital signs and the nursing notes.  Pertinent labs & imaging results that were available during my care of the patient were reviewed by me and considered in my medical decision making (see chart for details).    MDM Rules/Calculators/A&P                          CNYA MONDSresents to the ED complaining of right-sided chest pain and right abdominal pain.  She has multiple chronic medical conditions and also suffers from chronic pain.  Furthermore, she has dealt with carcinoid tumor of the duodenum in the past.  She was evaluated for the source of her right-sided pain.  No evidence of acute coronary syndrome, PE, pneumonia.  Abdominal scan  was not consistent with acute intra-abdominal pathology.  No mass, obstruction, or acute surgical pathology.  She has some minor lab abnormalities such as a slightly elevated white count.  However, overall, her labs are relatively close to her baseline.  She will be discharged home with recommendations to follow-up with her specialists and primary care provider. Final Clinical Impression(s) / ED Diagnoses Final diagnoses:  CKD (chronic kidney disease), stage II  Diabetic gastroparesis ([place])  Uncontrolled type 2 diabetes mellitus with diabetic neuropathy, with long-term current use of insulin [facility])    Rx /  DC Orders ED Discharge Orders     None        Arnaldo Natal, MD 08/28/20 1626

## 2020-08-28 NOTE — ED Notes (Signed)
Pt returned to room  

## 2020-08-28 NOTE — ED Notes (Signed)
IV team still at bedside attempting access

## 2020-08-28 NOTE — ED Notes (Signed)
Pt in NM 

## 2020-08-28 NOTE — ED Notes (Signed)
Pt refused to put her blood pressure cuff

## 2020-08-28 NOTE — ED Notes (Signed)
Pt given warm blanket.

## 2020-08-28 NOTE — ED Notes (Signed)
Pt calling for a ride

## 2020-08-28 NOTE — Progress Notes (Signed)
Successful IV start with Ultrasound # 20, 1" Lt A/C

## 2020-09-01 ENCOUNTER — Encounter: Payer: Self-pay | Admitting: Registered Nurse

## 2020-09-04 LAB — TOXASSURE SELECT,+ANTIDEPR,UR

## 2020-09-05 ENCOUNTER — Other Ambulatory Visit: Payer: Self-pay | Admitting: Family Medicine

## 2020-09-05 ENCOUNTER — Telehealth: Payer: Self-pay | Admitting: *Deleted

## 2020-09-05 NOTE — Telephone Encounter (Signed)
Urine drug screen for this encounter is consistent for prescribed medication 

## 2020-09-10 ENCOUNTER — Other Ambulatory Visit: Payer: Self-pay | Admitting: Family Medicine

## 2020-09-10 MED ORDER — HYDRALAZINE HCL 50 MG PO TABS: 50.0000 mg | ORAL_TABLET | Freq: Three times a day (TID) | ORAL | 0 refills | Status: DC

## 2020-09-12 ENCOUNTER — Encounter: Payer: Medicare Other | Admitting: Physical Medicine and Rehabilitation

## 2020-09-14 ENCOUNTER — Emergency Department (HOSPITAL_COMMUNITY)
Admission: EM | Admit: 2020-09-14 | Discharge: 2020-09-14 | Disposition: A | Discharge status: Home / Self Care | Payer: Medicare Other | Attending: Emergency Medicine | Admitting: Emergency Medicine

## 2020-09-14 ENCOUNTER — Other Ambulatory Visit: Payer: Self-pay

## 2020-09-14 ENCOUNTER — Emergency Department (HOSPITAL_COMMUNITY): Payer: Medicare Other

## 2020-09-14 DIAGNOSIS — I130 Hypertensive heart and chronic kidney disease with heart failure and stage 1 through stage 4 chronic kidney disease, or unspecified chronic kidney disease: Secondary | ICD-10-CM | POA: Insufficient documentation

## 2020-09-14 DIAGNOSIS — E1143 Type 2 diabetes mellitus with diabetic autonomic (poly)neuropathy: Secondary | ICD-10-CM | POA: Diagnosis not present

## 2020-09-14 DIAGNOSIS — I5022 Chronic systolic (congestive) heart failure: Secondary | ICD-10-CM | POA: Diagnosis not present

## 2020-09-14 DIAGNOSIS — Z794 Long term (current) use of insulin: Secondary | ICD-10-CM | POA: Insufficient documentation

## 2020-09-14 DIAGNOSIS — R079 Chest pain, unspecified: Secondary | ICD-10-CM | POA: Insufficient documentation

## 2020-09-14 DIAGNOSIS — E1140 Type 2 diabetes mellitus with diabetic neuropathy, unspecified: Secondary | ICD-10-CM | POA: Insufficient documentation

## 2020-09-14 DIAGNOSIS — N182 Chronic kidney disease, stage 2 (mild): Secondary | ICD-10-CM | POA: Diagnosis not present

## 2020-09-14 DIAGNOSIS — Z85060 Personal history of malignant carcinoid tumor of small intestine: Secondary | ICD-10-CM | POA: Diagnosis not present

## 2020-09-14 DIAGNOSIS — R109 Unspecified abdominal pain: Secondary | ICD-10-CM | POA: Diagnosis present

## 2020-09-14 DIAGNOSIS — Z7984 Long term (current) use of oral hypoglycemic drugs: Secondary | ICD-10-CM | POA: Diagnosis not present

## 2020-09-14 DIAGNOSIS — Z7982 Long term (current) use of aspirin: Secondary | ICD-10-CM | POA: Diagnosis not present

## 2020-09-14 DIAGNOSIS — J449 Chronic obstructive pulmonary disease, unspecified: Secondary | ICD-10-CM | POA: Diagnosis not present

## 2020-09-14 DIAGNOSIS — E1110 Type 2 diabetes mellitus with ketoacidosis without coma: Secondary | ICD-10-CM | POA: Insufficient documentation

## 2020-09-14 DIAGNOSIS — K3184 Gastroparesis: Secondary | ICD-10-CM

## 2020-09-14 DIAGNOSIS — Z79899 Other long term (current) drug therapy: Secondary | ICD-10-CM | POA: Diagnosis not present

## 2020-09-14 DIAGNOSIS — R112 Nausea with vomiting, unspecified: Secondary | ICD-10-CM

## 2020-09-14 DIAGNOSIS — Z7901 Long term (current) use of anticoagulants: Secondary | ICD-10-CM | POA: Insufficient documentation

## 2020-09-14 DIAGNOSIS — R1084 Generalized abdominal pain: Secondary | ICD-10-CM

## 2020-09-14 LAB — COMPREHENSIVE METABOLIC PANEL
ALT: <5 U/L (ref 0–44)
AST: 29 U/L (ref 15–41)
Albumin: 3.6 g/dL (ref 3.5–5.0)
Alkaline Phosphatase: 98 U/L (ref 38–126)
Anion gap: 16 — ABNORMAL HIGH (ref 5–15)
BUN: 34 mg/dL — ABNORMAL HIGH (ref 6–20)
CO2: 15 mmol/L — ABNORMAL LOW (ref 22–32)
Calcium: 9.9 mg/dL (ref 8.9–10.3)
Chloride: 112 mmol/L — ABNORMAL HIGH (ref 98–111)
Creatinine, Ser: 1.91 mg/dL — ABNORMAL HIGH (ref 0.44–1.00)
GFR, Estimated: 30 mL/min — ABNORMAL LOW (ref >60)
Glucose, Bld: 168 mg/dL — ABNORMAL HIGH (ref 70–99)
Potassium: 3 mmol/L — ABNORMAL LOW (ref 3.5–5.1)
Sodium: 143 mmol/L (ref 135–145)
Total Bilirubin: 0.7 mg/dL (ref 0.3–1.2)
Total Protein: 9.3 g/dL — ABNORMAL HIGH (ref 6.5–8.1)

## 2020-09-14 LAB — CBC WITH DIFFERENTIAL/PLATELET
Abs Immature Granulocytes: 0.12 10*3/uL — ABNORMAL HIGH (ref 0.00–0.07)
Basophils Absolute: 0.1 10*3/uL (ref 0.0–0.1)
Basophils Relative: 0 %
Eosinophils Absolute: 0 10*3/uL (ref 0.0–0.5)
Eosinophils Relative: 0 %
HCT: 28.1 % — ABNORMAL LOW (ref 36.0–46.0)
Hemoglobin: 8.7 g/dL — ABNORMAL LOW (ref 12.0–15.0)
Immature Granulocytes: 1 %
Lymphocytes Relative: 9 %
Lymphs Abs: 1.6 10*3/uL (ref 0.7–4.0)
MCH: 27.5 pg (ref 26.0–34.0)
MCHC: 31 g/dL (ref 30.0–36.0)
MCV: 88.9 fL (ref 80.0–100.0)
Monocytes Absolute: 0.3 10*3/uL (ref 0.1–1.0)
Monocytes Relative: 2 %
Neutro Abs: 15 10*3/uL — ABNORMAL HIGH (ref 1.7–7.7)
Neutrophils Relative %: 88 %
Platelets: 576 10*3/uL — ABNORMAL HIGH (ref 150–400)
RBC: 3.16 MIL/uL — ABNORMAL LOW (ref 3.87–5.11)
RDW: 15.4 % (ref 11.5–15.5)
WBC: 17.1 10*3/uL — ABNORMAL HIGH (ref 4.0–10.5)
nRBC: 0 % (ref 0.0–0.2)

## 2020-09-14 LAB — BASIC METABOLIC PANEL
Anion gap: 9 (ref 5–15)
BUN: 31 mg/dL — ABNORMAL HIGH (ref 6–20)
CO2: 20 mmol/L — ABNORMAL LOW (ref 22–32)
Calcium: 9.1 mg/dL (ref 8.9–10.3)
Chloride: 113 mmol/L — ABNORMAL HIGH (ref 98–111)
Creatinine, Ser: 1.64 mg/dL — ABNORMAL HIGH (ref 0.44–1.00)
GFR, Estimated: 37 mL/min — ABNORMAL LOW (ref >60)
Glucose, Bld: 170 mg/dL — ABNORMAL HIGH (ref 70–99)
Potassium: 3.2 mmol/L — ABNORMAL LOW (ref 3.5–5.1)
Sodium: 142 mmol/L (ref 135–145)

## 2020-09-14 LAB — BLOOD GAS, VENOUS
Acid-base deficit: 3.9 mmol/L — ABNORMAL HIGH (ref 0.0–2.0)
Bicarbonate: 20.4 mmol/L (ref 20.0–28.0)
FIO2: 21
O2 Saturation: 54.8 %
Patient temperature: 98.6
pCO2, Ven: 36.6 mmHg — ABNORMAL LOW (ref 44.0–60.0)
pH, Ven: 7.365 (ref 7.250–7.430)
pO2, Ven: 34.9 mmHg (ref 32.0–45.0)

## 2020-09-14 LAB — URINALYSIS, ROUTINE W REFLEX MICROSCOPIC
Bilirubin Urine: NEGATIVE
Glucose, UA: >=500 mg/dL — AB
Ketones, ur: NEGATIVE mg/dL
Leukocytes,Ua: NEGATIVE
Nitrite: NEGATIVE
Protein, ur: >=300 mg/dL — AB
Specific Gravity, Urine: 1.01 (ref 1.005–1.030)
pH: 5 (ref 5.0–8.0)

## 2020-09-14 LAB — TROPONIN I (HIGH SENSITIVITY)
Troponin I (High Sensitivity): 33 ng/L — ABNORMAL HIGH (ref <18)
Troponin I (High Sensitivity): 44 ng/L — ABNORMAL HIGH (ref <18)

## 2020-09-14 LAB — I-STAT BETA HCG BLOOD, ED (MC, WL, AP ONLY): I-stat hCG, quantitative: <5 m[IU]/mL (ref <5)

## 2020-09-14 LAB — LIPASE, BLOOD: Lipase: 26 U/L (ref 11–51)

## 2020-09-14 LAB — CBG MONITORING, ED: Glucose-Capillary: 182 mg/dL — ABNORMAL HIGH (ref 70–99)

## 2020-09-14 LAB — MAGNESIUM: Magnesium: 1.3 mg/dL — ABNORMAL LOW (ref 1.7–2.4)

## 2020-09-14 MED ORDER — HYDROMORPHONE HCL 1 MG/ML IJ SOLN
1.0000 mg | Freq: Once | INTRAMUSCULAR | Status: AC
  Administered 2020-09-14: 1 mg via INTRAVENOUS
  Filled 2020-09-14: qty 1

## 2020-09-14 MED ORDER — DIPHENHYDRAMINE HCL 50 MG/ML IJ SOLN
12.5000 mg | Freq: Once | INTRAMUSCULAR | Status: AC
  Administered 2020-09-14: 12.5 mg via INTRAVENOUS
  Filled 2020-09-14: qty 1

## 2020-09-14 MED ORDER — ALUM & MAG HYDROXIDE-SIMETH 200-200-20 MG/5ML PO SUSP
30.0000 mL | Freq: Once | ORAL | Status: AC
  Administered 2020-09-14: 30 mL via ORAL
  Filled 2020-09-14: qty 30

## 2020-09-14 MED ORDER — MAGNESIUM SULFATE 50 % IJ SOLN: 1.0000 g | Freq: Once | INTRAMUSCULAR | Status: DC

## 2020-09-14 MED ORDER — POTASSIUM CHLORIDE 10 MEQ/100ML IV SOLN
10.0000 meq | Freq: Once | INTRAVENOUS | Status: AC
  Administered 2020-09-14: 10 meq via INTRAVENOUS
  Filled 2020-09-14: qty 100

## 2020-09-14 MED ORDER — MAGNESIUM SULFATE IN D5W 1-5 GM/100ML-% IV SOLN
1.0000 g | Freq: Once | INTRAVENOUS | Status: AC
  Administered 2020-09-14: 1 g via INTRAVENOUS
  Filled 2020-09-14: qty 100

## 2020-09-14 MED ORDER — LIDOCAINE VISCOUS HCL 2 % MT SOLN
15.0000 mL | Freq: Once | OROMUCOSAL | Status: AC
  Administered 2020-09-14: 15 mL via ORAL
  Filled 2020-09-14: qty 15

## 2020-09-14 MED ORDER — POTASSIUM CHLORIDE ER 10 MEQ PO TBCR: 10.0000 meq | EXTENDED_RELEASE_TABLET | Freq: Every day | ORAL | 0 refills | Status: DC

## 2020-09-14 MED ORDER — SODIUM CHLORIDE 0.9 % IV BOLUS
500.0000 mL | Freq: Once | INTRAVENOUS | Status: AC
Start: 2020-09-14 — End: 2020-09-14
  Administered 2020-09-14: 500 mL via INTRAVENOUS

## 2020-09-14 MED ORDER — ONDANSETRON HCL 4 MG/2ML IJ SOLN
4.0000 mg | Freq: Once | INTRAMUSCULAR | Status: AC
  Administered 2020-09-14: 4 mg via INTRAVENOUS
  Filled 2020-09-14: qty 2

## 2020-09-14 MED ORDER — POTASSIUM CHLORIDE CRYS ER 20 MEQ PO TBCR
40.0000 meq | EXTENDED_RELEASE_TABLET | Freq: Once | ORAL | Status: AC
  Administered 2020-09-14: 40 meq via ORAL
  Filled 2020-09-14 (×2): qty 2

## 2020-09-14 MED ORDER — ONDANSETRON 4 MG PO TBDP: 4.0000 mg | ORAL_TABLET | Freq: Three times a day (TID) | ORAL | 0 refills | Status: DC | PRN

## 2020-09-14 MED ORDER — HALOPERIDOL LACTATE 5 MG/ML IJ SOLN
5.0000 mg | Freq: Once | INTRAMUSCULAR | Status: AC
  Administered 2020-09-14: 5 mg via INTRAMUSCULAR
  Filled 2020-09-14: qty 1

## 2020-09-14 MED ORDER — DICYCLOMINE HCL 20 MG PO TABS: 20.0000 mg | ORAL_TABLET | Freq: Two times a day (BID) | ORAL | 0 refills | Status: DC

## 2020-09-14 NOTE — ED Provider Notes (Signed)
[facility]or[name] DEPT Provider Note   [name]: [id number] Arrival date & time: 09/14/20  1050     History Chief Complaint  Patient presents with   Abdominal Pain   Emesis    Deborah [name] is a 55 y.o. female.  HPI Patient is a 55 year old female with past medical history significant for chronic pain syndrome, medication noncompliance, anemia, cancer s/p resection, CHF, DM 2, obesity, stroke, fibromyalgia, reflux, gastroparesis, HLD, HTN  Patient is presented to the ER today with complaints of abdominal pain that began this morning and seem to worsen around 9 AM.  She states that this is much worse than her chronic pain.  She states that she has been taking her Dilaudid tablets at home without relief states that she is now having nausea and vomiting has not been able to take her pain medication anything else and therefore was not able to control her pain  She also endorses chest pain that she states is achy and burning and happens worse when she vomits.  Does not seem to be exertional nor is it pleuritic.  She states that she takes all of her medications.  States that she has not missed any doses of any medications.  She denies any lightheadedness or dizziness or headache.  Denies any numbness or weakness in any extremity no fevers.  Denies any diarrhea  States that her nausea seems to be constant she has not had any relief with the Zofran that she was given in route by EMS.     Past Medical History:  Diagnosis Date   Acute back pain with sciatica, left    Acute back pain with sciatica, right    AKI (acute kidney injury) ([place])    Anemia, unspecified    Cancer (HCC)    Carcinoid tumor of duodenum    Chronic pain    Chronic systolic CHF (congestive heart failure) ([place])    Diabetes mellitus    Esophageal reflux    Fibromyalgia    Gastric ulcer    Gastroparesis    Gout    Hyperlipidemia    Hypertension    Lumbosacral stenosis    NICM  (nonischemic cardiomyopathy) (HCC)    Obesity    PAF (paroxysmal atrial fibrillation) ([place])    Stroke ([place]) 02/2011   Vitamin B12 deficiency anemia     Patient Active Problem List   Diagnosis Date Noted   DKA (diabetic ketoacidosis) ([place]) 07/21/2020   ARF (acute renal failure) ([place]) 07/21/2020   Vaginal yeast infection 01/04/2020   AKI (acute kidney injury) ([place]) 11/27/2019   Chronic pain 09/04/2019   Malignant carcinoid tumor of duodenum ([place])    Nonalcoholic fatty liver disease 06/05/2019   Chronic diarrhea    COPD exacerbation ([place]) 05/08/2019   Acute on chronic HFrEF (heart failure with reduced ejection fraction) ([place])    Restrictive lung disease secondary to obesity    History of gastric ulcer    Uncontrolled type 2 diabetes mellitus with hyperglycemia ([place])    Fibroid uterus 24/82/5003   Chronic systolic CHF (congestive heart failure) ([place]) 12/19/2018   Intractable nausea and vomiting 12/17/2018   Hypoxia 12/17/2018   Abdominal pain 07/17/2018   Cardiac arrest ([place]) 11/29/2017   Leukocytosis 11/29/2017   Anxiety 11/29/2017   Stroke (cerebrum) ([place]) 03/19/2017   GERD (gastroesophageal reflux disease) 03/19/2017   Depression 03/19/2017   Morbid obesity ([place])    Normocytic normochromic anemia 08/16/2016   Diabetic gastroparesis ([place]) 06/05/2016   Gout 06/05/2016  Chest pain 09/26/2015   Hypokalemia 09/26/2015   Hypomagnesemia 09/26/2015   Nausea and vomiting 08/20/2015   DKA (diabetic ketoacidoses) 05/25/2015   Uncontrolled type 2 diabetes mellitus with diabetic neuropathy, with long-term current use of insulin ([place]) 05/25/2015   Type 2 diabetes mellitus with hyperglycemia, with long-term current use of insulin ([place]) 05/25/2015   CKD (chronic kidney disease), stage II 05/25/2015   Essential hypertension, benign 09/28/2013    Past Surgical History:  Procedure Laterality Date   BIOPSY  07/27/2019   Procedure: BIOPSY;  Surgeon: Clarene Essex, MD;  Location: [facility]  ENDOSCOPY;  Service: Endoscopy;;   BIOPSY  07/30/2019   Procedure: BIOPSY;  Surgeon: Otis Brace, MD;  Location: [facility] ENDOSCOPY;  Service: Gastroenterology;;   CATARACT EXTRACTION  01/2014   CHOLECYSTECTOMY     COLONOSCOPY WITH PROPOFOL N/A 07/30/2019   Procedure: COLONOSCOPY WITH PROPOFOL;  Surgeon: Otis Brace, MD;  Location: [facility] ENDOSCOPY;  Service: Gastroenterology;  Laterality: N/A;   ESOPHAGOGASTRODUODENOSCOPY N/A 07/27/2019   Procedure: ESOPHAGOGASTRODUODENOSCOPY (EGD);  Surgeon: Clarene Essex, MD;  Location: [facility];  Service: Endoscopy;  Laterality: N/A;   ESOPHAGOGASTRODUODENOSCOPY N/A 07/26/2020   Procedure: ESOPHAGOGASTRODUODENOSCOPY (EGD);  Surgeon: Arta Silence, MD;  Location: [facility];  Service: Endoscopy;  Laterality: N/A;   ESOPHAGOGASTRODUODENOSCOPY (EGD) WITH PROPOFOL N/A 08/02/2019   Procedure: ESOPHAGOGASTRODUODENOSCOPY (EGD) WITH PROPOFOL;  Surgeon: Otis Brace, MD;  [facility]: [facility];  Service: Gastroenterology;  Laterality: N/A;   HEMOSTASIS CLIP PLACEMENT  08/02/2019   Procedure: HEMOSTASIS CLIP PLACEMENT;  Surgeon: Otis Brace, MD;  Location: [facility];  Service: Gastroenterology;;   POLYPECTOMY  07/30/2019   Procedure: POLYPECTOMY;  Surgeon: Otis Brace, MD;  Location: [facility];  Service: Gastroenterology;;   POLYPECTOMY  08/02/2019   Procedure: POLYPECTOMY;  Surgeon: Otis Brace, MD;  Location: [facility];  Service: Gastroenterology;;     OB History     Gravida  0   Para  0   Term  0   Preterm  0   AB  0   Living  0      SAB  0   IAB  0   Ectopic  0   Multiple  0   Live Births  0           Family History  Problem Relation Age of Onset   Diabetes Mother    Diabetes Father    Heart disease Father    Diabetes Sister    Congestive Heart Failure Sister 64   Diabetes Brother     Social History   Tobacco Use   Smoking status: Never   Smokeless tobacco: Never  Vaping Use   Vaping  Use: Never used  Substance Use Topics   Alcohol use: No   Drug use: No    Home Medications Prior to Admission medications   Medication Sig Start Date End Date Taking? Authorizing Provider  albuterol (PROVENTIL) (2.5 MG/3ML) 0.083% nebulizer solution Take 3 mLs (2.5 mg total) by nebulization every 6 (six) hours as needed for wheezing or shortness of breath. 04/06/19  Yes Scot Jun, FNP  dicyclomine (BENTYL) 20 MG tablet Take 1 tablet (20 mg total) by mouth 2 (two) times daily. 09/14/20  Yes Valaria Kohut S, PA  ondansetron (ZOFRAN ODT) 4 MG disintegrating tablet Take 1 tablet (4 mg total) by mouth every 8 (eight) hours as needed for nausea or vomiting. 09/14/20  Yes Cabela Pacifico S, PA  potassium chloride (KLOR-CON) 10 MEQ tablet Take 1 tablet (10 mEq total) by mouth  daily for 4 days. 09/14/20 09/18/20 Yes Vona Whiters S, PA  allopurinol (ZYLOPRIM) 100 MG tablet Take 100 mg by mouth daily.    [provider]  ALPRAZolam Duanne Moron) 1 MG tablet Take 1 tablet (1 mg total) by mouth 2 (two) times daily as needed for anxiety. 07/27/20   Shelly Coss, MD  ASPIRIN LOW DOSE 81 MG EC tablet TAKE ONE TABLET BY MOUTH ONCE DAILY (AM) 08/11/20   Meccariello, Bernita Raisin, DO  atorvastatin (LIPITOR) 10 MG tablet TAKE ONE TABLET BY MOUTH ONCE DAILY (AM) Patient taking differently: Take 10 mg by mouth daily. 11/14/19   Meccariello, Bernita Raisin, DO  blood glucose meter kit and supplies KIT Dispense based on patient and insurance preference. Use up to four times daily as directed. (FOR ICD-9 250.00, 250.01). Patient taking differently: Inject 1 each into the skin See admin instructions. Dispense based on patient and insurance preference. Use up to four times daily as directed. (FOR ICD-9 250.00, 250.01). 12/13/18   [name], DO  DULoxetine HCl 40 MG CPEP TAKE ONE CAPSULE BY MOUTH TWICE DAILY 08/11/20   Meccariello, Bernita Raisin, DO  EASY COMFORT PEN NEEDLES 31G X 5 MM MISC USE 3 TIMES A DAY FOR INSULIN  ADMINISTRATION Patient taking differently: 1 each by Other route 3 (three) times daily. 11/14/19   Meccariello, Mel Almond J, DO  ELIQUIS 5 MG TABS tablet TAKE 1 TABLET BY MOUTH 2 (TWO) TIMES DAILY. Patient taking differently: Take 5 mg by mouth 2 (two) times daily. 12/20/19   Meccariello, Bernita Raisin, DO  FARXIGA 10 MG TABS tablet Take 10 mg by mouth daily. 10/02/19   [provider]  ferrous sulfate 325 (65 FE) MG tablet Take 1 tablet (325 mg total) by mouth daily with breakfast. 07/27/20 08/26/20  Shelly Coss, MD  fluticasone (FLONASE) 50 MCG/ACT nasal spray Place 2 sprays into both nostrils daily as needed for allergies or rhinitis. 12/19/18   Rai, Vernelle Emerald, MD  hydrALAZINE (APRESOLINE) 50 MG tablet Take 1 tablet (50 mg total) by mouth 3 (three) times daily. 09/10/20   Alcus Dad, MD  HYDROmorphone (DILAUDID) 2 MG tablet Take 1 tablet (2 mg total) by mouth daily as needed for severe pain. 08/27/20   Bayard Hugger, NP  hydrOXYzine (ATARAX/VISTARIL) 10 MG tablet Take 1 tablet (10 mg total) by mouth 3 (three) times daily as needed. 11/15/19   Meccariello, Bernita Raisin, DO  isosorbide mononitrate (IMDUR) 60 MG 24 hr tablet TAKE 1 TABLET (60 MG TOTAL) BY MOUTH DAILY. 09/10/20   Alcus Dad, MD  LANTUS SOLOSTAR 100 UNIT/ML Solostar Pen Inject 32 Units into the skin at bedtime. Patient taking differently: Inject 40 Units into the skin at bedtime. 01/28/20 02/29/20  Raiford Noble Latif, DO  melatonin 3 MG TABS tablet TAKE 2 TABLETS (6 MG TOTAL) BY MOUTH AT BEDTIME. 08/11/20   Meccariello, Bernita Raisin, DO  metoprolol succinate (TOPROL-XL) 50 MG 24 hr tablet Take 1 tablet (50 mg total) by mouth at bedtime. 09/10/20   Alcus Dad, MD  nitroGLYCERIN (NITROSTAT) 0.4 MG SL tablet Place 1 tablet (0.4 mg total) under the tongue every 5 (five) minutes as needed for chest pain. 12/13/18   Lockamy, Timothy, DO  NOVOLOG FLEXPEN 100 UNIT/ML FlexPen INJECT 30 UNITS INTO THE SKIN IN THE MORNING, AT NOON, IN THE  EVENING, AND AT BEDTIME. 09/10/20   Alcus Dad, MD  ondansetron (ZOFRAN) 4 MG tablet Take 1 tablet (4 mg total) by mouth every 6 (six) hours as needed  for nausea. 07/01/20   Raulkar, Clide Deutscher, MD  pantoprazole (PROTONIX) 40 MG tablet Take 1 tablet (40 mg total) by mouth 2 (two) times daily. 08/11/20   Meccariello, Bernita Raisin, DO  promethazine (PHENERGAN) 12.5 MG tablet Take 1 tablet (12.5 mg total) by mouth every 6 (six) hours as needed for nausea or vomiting. 07/27/20   Shelly Coss, MD  torsemide (DEMADEX) 20 MG tablet Take 2 tablets (40 mg total) by mouth daily. 07/27/20   Shelly Coss, MD    Allergies    Diazepam, Gabapentin, Iodinated diagnostic agents, Isovue [iopamidol], Lisinopril, Metoclopramide, Nsaids, Penicillins, Tolmetin, Aspirin, Cyclobenzaprine, Rifamycins, Acetaminophen, and Tramadol  Review of Systems   Review of Systems  Constitutional:  Negative for chills and fever.  HENT:  Negative for congestion.   Eyes:  Negative for pain.  Respiratory:  Negative for cough and shortness of breath.   Cardiovascular:  Positive for chest pain. Negative for leg swelling.  Gastrointestinal:  Positive for abdominal pain, nausea and vomiting. Negative for diarrhea.  Genitourinary:  Negative for dysuria.  Musculoskeletal:  Negative for myalgias.  Skin:  Negative for rash.  Neurological:  Negative for dizziness and headaches.   Physical Exam Updated Vital Signs BP (!) 150/51   Pulse (!) 111   Temp 97.8 F (36.6 C) (Oral)   Resp 18   LMP 10/10/2012   SpO2 100%   Physical Exam Vitals and nursing note reviewed.  Constitutional:      General: She is not in acute distress.    Appearance: She is obese.     Comments: Patient is 55 year old female initially screaming however was able to redirect when I informed her that I could not provide her any pain medicine until I got some basic history.  After this patient is pleasant and more cooperative.  Speaking in full sentences.  HENT:      Head: Normocephalic and atraumatic.     Nose: Nose normal.  Eyes:     General: No scleral icterus. Neck:     Comments: No JVP Cardiovascular:     Rate and Rhythm: Regular rhythm. Tachycardia present.     Pulses: Normal pulses.     Heart sounds: Normal heart sounds.     Comments: Heart rate 120 equal symmetric 3+ bilateral radial artery pulses Pulmonary:     Effort: Pulmonary effort is normal. No respiratory distress.     Breath sounds: Normal breath sounds. No wheezing.     Comments: Bronchovesicular breath lung sounds Abdominal:     Palpations: Abdomen is soft.     Tenderness: There is abdominal tenderness.     Comments: Abdomen is obese, soft, seems to be diffusely tender with no focal tenderness.  No guarding or rebound tenderness.  No CVA tenderness.  Musculoskeletal:     Cervical back: Normal range of motion.     Right lower leg: No edema.     Left lower leg: No edema.  Skin:    General: Skin is warm and dry.     Capillary Refill: Capillary refill takes less than 2 seconds.     Comments: No significant lower extremity edema  Neurological:     Mental Status: She is alert. Mental status is at baseline.  Psychiatric:     Comments: Agitated, anxious    ED Results / Procedures / Treatments   Labs (all labs ordered are listed, but only abnormal results are displayed) Labs Reviewed  CBC WITH DIFFERENTIAL/PLATELET - Abnormal; Notable for the following components:  Result Value   WBC 17.1 (*)    RBC 3.16 (*)    Hemoglobin 8.7 (*)    HCT 28.1 (*)    Platelets 576 (*)    Neutro Abs 15.0 (*)    Abs Immature Granulocytes 0.12 (*)    All other components within normal limits  COMPREHENSIVE METABOLIC PANEL - Abnormal; Notable for the following components:   Potassium 3.0 (*)    Chloride 112 (*)    CO2 15 (*)    Glucose, Bld 168 (*)    BUN 34 (*)    Creatinine, Ser 1.91 (*)    Total Protein 9.3 (*)    GFR, Estimated 30 (*)    Anion gap 16 (*)    All other  components within normal limits  URINALYSIS, ROUTINE W REFLEX MICROSCOPIC - Abnormal; Notable for the following components:   Glucose, UA >=500 (*)    Hgb urine dipstick MODERATE (*)    Protein, ur >=300 (*)    All other components within normal limits  MAGNESIUM - Abnormal; Notable for the following components:   Magnesium 1.3 (*)    All other components within normal limits  BLOOD GAS, VENOUS - Abnormal; Notable for the following components:   pCO2, Ven 36.6 (*)    Acid-base deficit 3.9 (*)    All other components within normal limits  BASIC METABOLIC PANEL - Abnormal; Notable for the following components:   Potassium 3.2 (*)    Chloride 113 (*)    CO2 20 (*)    Glucose, Bld 170 (*)    BUN 31 (*)    Creatinine, Ser 1.64 (*)    GFR, Estimated 37 (*)    All other components within normal limits  CBG MONITORING, ED - Abnormal; Notable for the following components:   Glucose-Capillary 182 (*)    All other components within normal limits  TROPONIN I (HIGH SENSITIVITY) - Abnormal; Notable for the following components:   Troponin I (High Sensitivity) 33 (*)    All other components within normal limits  TROPONIN I (HIGH SENSITIVITY) - Abnormal; Notable for the following components:   Troponin I (High Sensitivity) 44 (*)    All other components within normal limits  LIPASE, BLOOD  I-STAT BETA HCG BLOOD, ED ([facility], [facility], [facility] ONLY)    EKG EKG Interpretation  Date/Time:  Sunday September 14 2020 15:46:03 EDT Ventricular Rate:  114 PR Interval:  160 QRS Duration: 95 QT Interval:  359 QTC Calculation: 495 R Axis:   -44 Text Interpretation: Sinus tachycardia Left axis deviation Abnormal R-wave progression, late transition Minimal ST depression, anterolateral leads Borderline prolonged QT interval Since last tracing of earlier today No significant change was found Confirmed by Daleen Bo [phone]) on 09/14/2020 4:02:32 PM  Radiology CT ABDOMEN PELVIS WO CONTRAST  Result Date:  09/14/2020 CLINICAL DATA:  55 year old female with acute abdominal and pelvic pain with nausea and vomiting. History of gastric cancer. EXAM: CT ABDOMEN AND PELVIS WITHOUT CONTRAST TECHNIQUE: Multidetector CT imaging of the abdomen and pelvis was performed following the standard protocol without IV contrast. COMPARISON:  08/28/2020 and prior CTs FINDINGS: Please note that parenchymal abnormalities may be missed without intravenous contrast. Lower chest: No acute abnormality Hepatobiliary: The liver is unremarkable. The patient is status post cholecystectomy. No biliary dilatation. Pancreas: Unremarkable Spleen: Unremarkable Adrenals/Urinary Tract: The kidneys, adrenal glands and bladder are unremarkable. Stomach/Bowel: Stomach is within normal limits. Appendix appears normal. No evidence of bowel wall thickening, distention, or inflammatory changes. Vascular/Lymphatic: Aortic  atherosclerosis. No enlarged abdominal or pelvic lymph nodes. Reproductive: A 4.7 cm LEFT pedunculated uterine fibroid is again noted. No other significant abnormalities. Other: No ascites, focal collection or pneumoperitoneum. Musculoskeletal: No acute or suspicious bony abnormalities identified. IMPRESSION: 1. No evidence of acute abnormality. 2. Unchanged 4.7 cm LEFT uterine fibroid. 3. Aortic Atherosclerosis (ICD10-I70.0). Electronically Signed   By: Margarette Canada M.D.   On: 09/14/2020 13:16   DG Chest Portable 1 View  Result Date: 09/14/2020 CLINICAL DATA:  Abdominal pain with nausea and vomiting since 9 a.m. Gastric cancer. EXAM: PORTABLE CHEST 1 VIEW COMPARISON:  08/28/2020 FINDINGS: Midline trachea. Normal heart size for level of inspiration. No pleural effusion or pneumothorax. Clear lungs. IMPRESSION: No acute cardiopulmonary disease. Electronically Signed   By: Abigail Miyamoto M.D.   On: 09/14/2020 15:42    Procedures Procedures   Medications Ordered in ED Medications  HYDROmorphone (DILAUDID) injection 1 mg (1 mg Intravenous  Given 09/14/20 1157)  sodium chloride 0.9 % bolus 500 mL (0 mLs Intravenous Stopped 09/14/20 1549)  ondansetron (ZOFRAN) injection 4 mg (4 mg Intravenous Given 09/14/20 1156)  haloperidol lactate (HALDOL) injection 5 mg (5 mg Intramuscular Given 09/14/20 1353)  diphenhydrAMINE (BENADRYL) injection 12.5 mg (12.5 mg Intravenous Given 09/14/20 1353)  potassium chloride SA (KLOR-CON) CR tablet 40 mEq (40 mEq Oral Given 09/14/20 1743)  potassium chloride 10 mEq in 100 mL IVPB (0 mEq Intravenous Stopped 09/14/20 1718)  magnesium sulfate IVPB 1 g 100 mL (0 g Intravenous Stopped 09/14/20 1849)  HYDROmorphone (DILAUDID) injection 1 mg (1 mg Intravenous Given 09/14/20 1718)  alum & mag hydroxide-simeth (MAALOX/MYLANTA) 200-200-20 MG/5ML suspension 30 mL (30 mLs Oral Given 09/14/20 1718)    And  lidocaine (XYLOCAINE) 2 % viscous mouth solution 15 mL (15 mLs Oral Given 09/14/20 1718)    ED Course  I have reviewed the triage vital signs and the nursing notes.  Pertinent labs & imaging results that were available during my care of the patient were reviewed by me and considered in my medical decision making (see chart for details).  Clinical Course as of 09/16/20 1642  Sun Sep 14, 2020  1712 IMPRESSION: 1. No evidence of acute abnormality. 2. Unchanged 4.7 cm LEFT uterine fibroid. 3. Aortic Atherosclerosis (ICD10-I70.0).   [WF]  6045 DG chest NAD IMPRESSION: No acute cardiopulmonary disease. [WF]    Clinical Course User Index [WF] Tedd Sias, PA   MDM Rules/Calculators/A&P                          Patient is a 55 year old female relatively well-known to the department has been seen once per month over the past few months.  Has multiple medical problems and does have chronic pain which seems to be poorly controlled also seems to have some historic issues with medication noncompliance was admitted for DKA 2 months ago.  She is presenting to the ER today 3 weeks after she was last seen in the ER  and had a reassuring work-up which included evaluation for PE, ACS as part of her chest pain and abdominal pain work-up.  Patient is quite agitated initially she is tachycardic is tachypneic secondary I suspect the pain is screaming but quite redirectable.  She has some diffuse abdominal tenderness but otherwise does not have any significant findings on examination.  She is morbidly obese and abdominal exam is somewhat restricted as a result of this.  Given that she is tachycardic and has a history  of DKA will provide with an initial 500 mL bolus of fluids however with her history of heart failure will be somewhat cautious about fluid resuscitation.  Overall I have relatively low suspicion for acute emergent condition today.  But with her medical problems and symptoms will need thorough work-up.  On my reassessment of patient approximately 30 minutes after my initial evaluation I walked into her room to find her attempting to gag herself on her fingers.  I asked her what she was doing and she replied "getting something out of my mouth ".   Troponin x2 mildly elevated consistent with some demand ischemia from tachycardia delta with delta 11 this is likely a result of her tachycardia. Magnesium found to be low at 1.3 will provide with repletion here in ER via IV.  Potassium is 3.0.  Will provide with IV and p.o. repletion.  Also notably has bicarb of 15 and this may be from patient hyperventilating.  Mild hyperglycemia.  Creatinine actually improved from baseline.  Lipase within normal limits at pancreatitis.  VBG with normal pH.  Low suspicion for DKA.  Blood sugar also not significantly elevated rechecked a BMP which had a anion gap of 0 and bicarb is now 20 more consistent with patient's baseline.  Urinalysis with no ketones.  Some protein and glucose present.  Chest x-ray unremarkable.  CT abdomen pelvis without contrast unremarkable.  Unchanged fibroid.  She is aware of this.  On my reassessment  patient seems to have improved significantly from Haldol.  Dilaudid did very little to minimize her symptoms and Zofran--although it seemed to help her symptoms and she did not have any vomiting--did not quell her concerns about her nausea.  Ultimately Haldol seemed very helpful for this patient.  I discussed this case with my attending physician who cosigned this note including patient's presenting symptoms, physical exam, and planned diagnostics and interventions. Attending physician stated agreement with plan or made changes to plan which were implemented.   Patient is understanding of plan to discharge.  She is tolerating p.o. and tolerated GI cocktail which seemed to help some of her symptoms as well.  Will discharge home with Bentyl, Zofran, potassium tablets.  She will follow-up in the next 5-7 days with her PCP to have electrolytes rechecked.  Return precautions given.  Final Clinical Impression(s) / ED Diagnoses Final diagnoses:  Non-intractable vomiting with nausea, unspecified vomiting type  Generalized abdominal pain  Diabetic gastroparesis ([place])    Rx / DC Orders ED Discharge Orders          Ordered    ondansetron (ZOFRAN ODT) 4 MG disintegrating tablet  Every 8 hours PRN        09/14/20 1902    dicyclomine (BENTYL) 20 MG tablet  2 times daily        09/14/20 1902    potassium chloride (KLOR-CON) 10 MEQ tablet  Daily        09/14/20 1902             Tedd Sias, [place] 09/16/20 1653    Milton Ferguson, MD 09/20/20 0720

## 2020-09-14 NOTE — ED Notes (Signed)
Pt given water and able to keep it down without difficulty

## 2020-09-14 NOTE — ED Notes (Signed)
Urine culture sent with specimen 

## 2020-09-14 NOTE — Discharge Instructions (Addendum)
Please follow up with your primary care and gastroenterologist.   Take your medications as prescribed.

## 2020-09-14 NOTE — ED Notes (Signed)
CT made aware that pt is okay with going to CT scan

## 2020-09-14 NOTE — ED Triage Notes (Addendum)
Per EMS, patient from home, c/o abdominal pain with N/V since 0900. Hx stomach cancer.  100% on RA. Recently seen for diabetic gastroparesis.  20g R AC 4mg  Zofran  CBG 293

## 2020-09-14 NOTE — ED Notes (Addendum)
Pt complaining of severe pain. Tedd Sias, PA made aware and at bedside.

## 2020-10-07 ENCOUNTER — Encounter (HOSPITAL_COMMUNITY): Payer: Self-pay | Admitting: Emergency Medicine

## 2020-10-07 ENCOUNTER — Inpatient Hospital Stay (HOSPITAL_COMMUNITY)
Admission: EM | Admit: 2020-10-07 | Discharge: 2020-10-13 | DRG: 074 | Disposition: A | Discharge status: Home / Self Care | Payer: Medicare Other | Attending: Internal Medicine | Admitting: Internal Medicine

## 2020-10-07 ENCOUNTER — Telehealth: Payer: Self-pay | Admitting: Registered Nurse

## 2020-10-07 ENCOUNTER — Other Ambulatory Visit: Payer: Self-pay

## 2020-10-07 ENCOUNTER — Emergency Department (HOSPITAL_COMMUNITY): Payer: Medicare Other

## 2020-10-07 ENCOUNTER — Encounter: Payer: Medicare Other | Admitting: Registered Nurse

## 2020-10-07 DIAGNOSIS — I130 Hypertensive heart and chronic kidney disease with heart failure and stage 1 through stage 4 chronic kidney disease, or unspecified chronic kidney disease: Secondary | ICD-10-CM | POA: Diagnosis present

## 2020-10-07 DIAGNOSIS — N179 Acute kidney failure, unspecified: Secondary | ICD-10-CM | POA: Insufficient documentation

## 2020-10-07 DIAGNOSIS — E1122 Type 2 diabetes mellitus with diabetic chronic kidney disease: Secondary | ICD-10-CM | POA: Diagnosis present

## 2020-10-07 DIAGNOSIS — Z885 Allergy status to narcotic agent status: Secondary | ICD-10-CM

## 2020-10-07 DIAGNOSIS — G8929 Other chronic pain: Secondary | ICD-10-CM | POA: Diagnosis present

## 2020-10-07 DIAGNOSIS — E1143 Type 2 diabetes mellitus with diabetic autonomic (poly)neuropathy: Principal | ICD-10-CM | POA: Diagnosis present

## 2020-10-07 DIAGNOSIS — K7460 Unspecified cirrhosis of liver: Secondary | ICD-10-CM | POA: Diagnosis present

## 2020-10-07 DIAGNOSIS — Z6841 Body Mass Index (BMI) 40.0 and over, adult: Secondary | ICD-10-CM

## 2020-10-07 DIAGNOSIS — E1165 Type 2 diabetes mellitus with hyperglycemia: Secondary | ICD-10-CM | POA: Diagnosis present

## 2020-10-07 DIAGNOSIS — D649 Anemia, unspecified: Secondary | ICD-10-CM | POA: Diagnosis present

## 2020-10-07 DIAGNOSIS — I480 Paroxysmal atrial fibrillation: Secondary | ICD-10-CM | POA: Diagnosis present

## 2020-10-07 DIAGNOSIS — Z8249 Family history of ischemic heart disease and other diseases of the circulatory system: Secondary | ICD-10-CM

## 2020-10-07 DIAGNOSIS — I5022 Chronic systolic (congestive) heart failure: Secondary | ICD-10-CM | POA: Diagnosis not present

## 2020-10-07 DIAGNOSIS — Z85028 Personal history of other malignant neoplasm of stomach: Secondary | ICD-10-CM

## 2020-10-07 DIAGNOSIS — E860 Dehydration: Secondary | ICD-10-CM | POA: Diagnosis present

## 2020-10-07 DIAGNOSIS — Z8711 Personal history of peptic ulcer disease: Secondary | ICD-10-CM

## 2020-10-07 DIAGNOSIS — N1832 Chronic kidney disease, stage 3b: Secondary | ICD-10-CM | POA: Diagnosis present

## 2020-10-07 DIAGNOSIS — Z888 Allergy status to other drugs, medicaments and biological substances status: Secondary | ICD-10-CM

## 2020-10-07 DIAGNOSIS — D631 Anemia in chronic kidney disease: Secondary | ICD-10-CM | POA: Diagnosis present

## 2020-10-07 DIAGNOSIS — R109 Unspecified abdominal pain: Secondary | ICD-10-CM

## 2020-10-07 DIAGNOSIS — D72829 Elevated white blood cell count, unspecified: Secondary | ICD-10-CM | POA: Diagnosis present

## 2020-10-07 DIAGNOSIS — Z833 Family history of diabetes mellitus: Secondary | ICD-10-CM

## 2020-10-07 DIAGNOSIS — I10 Essential (primary) hypertension: Secondary | ICD-10-CM | POA: Diagnosis not present

## 2020-10-07 DIAGNOSIS — R112 Nausea with vomiting, unspecified: Secondary | ICD-10-CM | POA: Diagnosis not present

## 2020-10-07 DIAGNOSIS — I428 Other cardiomyopathies: Secondary | ICD-10-CM | POA: Diagnosis present

## 2020-10-07 DIAGNOSIS — D75839 Thrombocytosis, unspecified: Secondary | ICD-10-CM | POA: Diagnosis present

## 2020-10-07 DIAGNOSIS — Z23 Encounter for immunization: Secondary | ICD-10-CM

## 2020-10-07 DIAGNOSIS — E1140 Type 2 diabetes mellitus with diabetic neuropathy, unspecified: Secondary | ICD-10-CM

## 2020-10-07 DIAGNOSIS — E785 Hyperlipidemia, unspecified: Secondary | ICD-10-CM | POA: Diagnosis present

## 2020-10-07 DIAGNOSIS — IMO0002 Reserved for concepts with insufficient information to code with codable children: Secondary | ICD-10-CM

## 2020-10-07 DIAGNOSIS — Z20822 Contact with and (suspected) exposure to covid-19: Secondary | ICD-10-CM | POA: Diagnosis present

## 2020-10-07 DIAGNOSIS — Z794 Long term (current) use of insulin: Secondary | ICD-10-CM

## 2020-10-07 DIAGNOSIS — E861 Hypovolemia: Secondary | ICD-10-CM | POA: Diagnosis present

## 2020-10-07 DIAGNOSIS — K3184 Gastroparesis: Secondary | ICD-10-CM | POA: Diagnosis present

## 2020-10-07 DIAGNOSIS — Z8673 Personal history of transient ischemic attack (TIA), and cerebral infarction without residual deficits: Secondary | ICD-10-CM

## 2020-10-07 DIAGNOSIS — E876 Hypokalemia: Secondary | ICD-10-CM | POA: Diagnosis present

## 2020-10-07 DIAGNOSIS — K529 Noninfective gastroenteritis and colitis, unspecified: Secondary | ICD-10-CM | POA: Diagnosis present

## 2020-10-07 DIAGNOSIS — N184 Chronic kidney disease, stage 4 (severe): Secondary | ICD-10-CM | POA: Diagnosis present

## 2020-10-07 DIAGNOSIS — K5900 Constipation, unspecified: Secondary | ICD-10-CM | POA: Diagnosis not present

## 2020-10-07 DIAGNOSIS — R7989 Other specified abnormal findings of blood chemistry: Secondary | ICD-10-CM | POA: Diagnosis present

## 2020-10-07 DIAGNOSIS — Z91041 Radiographic dye allergy status: Secondary | ICD-10-CM

## 2020-10-07 DIAGNOSIS — D3A010 Benign carcinoid tumor of the duodenum: Secondary | ICD-10-CM | POA: Diagnosis present

## 2020-10-07 DIAGNOSIS — Z886 Allergy status to analgesic agent status: Secondary | ICD-10-CM

## 2020-10-07 DIAGNOSIS — M797 Fibromyalgia: Secondary | ICD-10-CM | POA: Diagnosis present

## 2020-10-07 DIAGNOSIS — R778 Other specified abnormalities of plasma proteins: Secondary | ICD-10-CM | POA: Diagnosis present

## 2020-10-07 DIAGNOSIS — R9431 Abnormal electrocardiogram [ECG] [EKG]: Secondary | ICD-10-CM | POA: Diagnosis present

## 2020-10-07 DIAGNOSIS — Z7901 Long term (current) use of anticoagulants: Secondary | ICD-10-CM

## 2020-10-07 LAB — COMPREHENSIVE METABOLIC PANEL
ALT: 11 U/L (ref 0–44)
AST: 15 U/L (ref 15–41)
Albumin: 3.6 g/dL (ref 3.5–5.0)
Alkaline Phosphatase: 101 U/L (ref 38–126)
Anion gap: 12 (ref 5–15)
BUN: 16 mg/dL (ref 6–20)
CO2: 17 mmol/L — ABNORMAL LOW (ref 22–32)
Calcium: 8.5 mg/dL — ABNORMAL LOW (ref 8.9–10.3)
Chloride: 107 mmol/L (ref 98–111)
Creatinine, Ser: 2.01 mg/dL — ABNORMAL HIGH (ref 0.44–1.00)
GFR, Estimated: 29 mL/min — ABNORMAL LOW (ref >60)
Glucose, Bld: 218 mg/dL — ABNORMAL HIGH (ref 70–99)
Potassium: 2.6 mmol/L — CL (ref 3.5–5.1)
Sodium: 136 mmol/L (ref 135–145)
Total Bilirubin: 0.7 mg/dL (ref 0.3–1.2)
Total Protein: 8.5 g/dL — ABNORMAL HIGH (ref 6.5–8.1)

## 2020-10-07 LAB — CBC WITH DIFFERENTIAL/PLATELET
Abs Immature Granulocytes: 0.15 10*3/uL — ABNORMAL HIGH (ref 0.00–0.07)
Basophils Absolute: 0.1 10*3/uL (ref 0.0–0.1)
Basophils Relative: 1 %
Eosinophils Absolute: 0.2 10*3/uL (ref 0.0–0.5)
Eosinophils Relative: 1 %
HCT: 27.8 % — ABNORMAL LOW (ref 36.0–46.0)
Hemoglobin: 8.6 g/dL — ABNORMAL LOW (ref 12.0–15.0)
Immature Granulocytes: 1 %
Lymphocytes Relative: 14 %
Lymphs Abs: 2.2 10*3/uL (ref 0.7–4.0)
MCH: 27.9 pg (ref 26.0–34.0)
MCHC: 30.9 g/dL (ref 30.0–36.0)
MCV: 90.3 fL (ref 80.0–100.0)
Monocytes Absolute: 0.7 10*3/uL (ref 0.1–1.0)
Monocytes Relative: 5 %
Neutro Abs: 12.1 10*3/uL — ABNORMAL HIGH (ref 1.7–7.7)
Neutrophils Relative %: 78 %
Platelets: 551 10*3/uL — ABNORMAL HIGH (ref 150–400)
RBC: 3.08 MIL/uL — ABNORMAL LOW (ref 3.87–5.11)
RDW: 16.5 % — ABNORMAL HIGH (ref 11.5–15.5)
WBC: 15.5 10*3/uL — ABNORMAL HIGH (ref 4.0–10.5)
nRBC: 0 % (ref 0.0–0.2)

## 2020-10-07 LAB — URINALYSIS, ROUTINE W REFLEX MICROSCOPIC
Bacteria, UA: NONE SEEN
Bilirubin Urine: NEGATIVE
Glucose, UA: 50 mg/dL — AB
Ketones, ur: NEGATIVE mg/dL
Leukocytes,Ua: NEGATIVE
Nitrite: NEGATIVE
Protein, ur: 100 mg/dL — AB
Specific Gravity, Urine: 1.006 (ref 1.005–1.030)
pH: 6 (ref 5.0–8.0)

## 2020-10-07 LAB — TROPONIN I (HIGH SENSITIVITY)
Troponin I (High Sensitivity): 30 ng/L — ABNORMAL HIGH (ref <18)
Troponin I (High Sensitivity): 30 ng/L — ABNORMAL HIGH (ref <18)

## 2020-10-07 LAB — RESP PANEL BY RT-PCR (FLU A&B, COVID) ARPGX2
Influenza A by PCR: NEGATIVE
Influenza B by PCR: NEGATIVE
SARS Coronavirus 2 by RT PCR: NEGATIVE

## 2020-10-07 LAB — RAPID URINE DRUG SCREEN, HOSP PERFORMED
Amphetamines: NOT DETECTED
Barbiturates: NOT DETECTED
Benzodiazepines: NOT DETECTED
Cocaine: NOT DETECTED
Opiates: NOT DETECTED
Tetrahydrocannabinol: NOT DETECTED

## 2020-10-07 LAB — LIPASE, BLOOD: Lipase: 28 U/L (ref 11–51)

## 2020-10-07 LAB — CBG MONITORING, ED: Glucose-Capillary: 190 mg/dL — ABNORMAL HIGH (ref 70–99)

## 2020-10-07 MED ORDER — LORAZEPAM 2 MG/ML IJ SOLN
0.5000 mg | INTRAMUSCULAR | Status: DC | PRN
  Administered 2020-10-08 – 2020-10-13 (×17): 0.5 mg via INTRAVENOUS
  Filled 2020-10-07 (×17): qty 1

## 2020-10-07 MED ORDER — HYDROMORPHONE HCL 1 MG/ML IJ SOLN
1.0000 mg | Freq: Once | INTRAMUSCULAR | Status: AC
  Administered 2020-10-07: 1 mg via INTRAVENOUS
  Filled 2020-10-07: qty 1

## 2020-10-07 MED ORDER — SODIUM CHLORIDE 0.9 % IV BOLUS
1000.0000 mL | Freq: Once | INTRAVENOUS | Status: AC
  Administered 2020-10-07: 1000 mL via INTRAVENOUS

## 2020-10-07 MED ORDER — MAGNESIUM SULFATE IN D5W 1-5 GM/100ML-% IV SOLN
1.0000 g | Freq: Once | INTRAVENOUS | Status: AC
  Administered 2020-10-07: 1 g via INTRAVENOUS
  Filled 2020-10-07: qty 100

## 2020-10-07 MED ORDER — ACETAMINOPHEN 325 MG PO TABS: 650.0000 mg | ORAL_TABLET | Freq: Four times a day (QID) | ORAL | Status: DC | PRN

## 2020-10-07 MED ORDER — ONDANSETRON HCL 4 MG/2ML IJ SOLN
4.0000 mg | Freq: Once | INTRAMUSCULAR | Status: AC
  Administered 2020-10-07: 4 mg via INTRAVENOUS
  Filled 2020-10-07: qty 2

## 2020-10-07 MED ORDER — PANTOPRAZOLE SODIUM 40 MG IV SOLR
40.0000 mg | Freq: Once | INTRAVENOUS | Status: AC
  Administered 2020-10-07: 40 mg via INTRAVENOUS
  Filled 2020-10-07: qty 40

## 2020-10-07 MED ORDER — HEPARIN SODIUM (PORCINE) 5000 UNIT/ML IJ SOLN: 5000.0000 [IU] | Freq: Three times a day (TID) | INTRAMUSCULAR | Status: DC

## 2020-10-07 MED ORDER — HALOPERIDOL LACTATE 5 MG/ML IJ SOLN
2.0000 mg | Freq: Once | INTRAMUSCULAR | Status: AC
  Administered 2020-10-07: 2 mg via INTRAVENOUS
  Filled 2020-10-07: qty 1

## 2020-10-07 MED ORDER — HYDROMORPHONE HCL 1 MG/ML IJ SOLN: 0.5000 mg | INTRAMUSCULAR | Status: DC | PRN

## 2020-10-07 MED ORDER — LABETALOL HCL 5 MG/ML IV SOLN
10.0000 mg | Freq: Once | INTRAVENOUS | Status: AC
  Administered 2020-10-07: 10 mg via INTRAVENOUS
  Filled 2020-10-07: qty 4

## 2020-10-07 MED ORDER — POTASSIUM CHLORIDE 10 MEQ/100ML IV SOLN
10.0000 meq | INTRAVENOUS | Status: AC
  Administered 2020-10-07 (×2): 10 meq via INTRAVENOUS
  Filled 2020-10-07 (×2): qty 100

## 2020-10-07 MED ORDER — ACETAMINOPHEN 650 MG RE SUPP
650.0000 mg | Freq: Four times a day (QID) | RECTAL | Status: DC | PRN
Start: 2020-10-07 — End: 2020-10-09

## 2020-10-07 MED ORDER — INSULIN GLARGINE-YFGN 100 UNIT/ML ~~LOC~~ SOLN
15.0000 [IU] | Freq: Two times a day (BID) | SUBCUTANEOUS | Status: DC
  Administered 2020-10-07 – 2020-10-11 (×8): 15 [IU] via SUBCUTANEOUS
  Filled 2020-10-07 (×8): qty 0.15

## 2020-10-07 MED ORDER — SODIUM CHLORIDE 0.9 % IV SOLN: INTRAVENOUS | Status: DC

## 2020-10-07 MED ORDER — INSULIN ASPART 100 UNIT/ML IJ SOLN
0.0000 [IU] | INTRAMUSCULAR | Status: DC
  Administered 2020-10-07 – 2020-10-08 (×2): 3 [IU] via SUBCUTANEOUS
  Administered 2020-10-08: 5 [IU] via SUBCUTANEOUS
  Administered 2020-10-08 (×3): 3 [IU] via SUBCUTANEOUS
  Administered 2020-10-09: 8 [IU] via SUBCUTANEOUS
  Administered 2020-10-09 (×4): 3 [IU] via SUBCUTANEOUS
  Administered 2020-10-09: 11 [IU] via SUBCUTANEOUS
  Administered 2020-10-10 (×2): 3 [IU] via SUBCUTANEOUS
  Administered 2020-10-10 (×4): 8 [IU] via SUBCUTANEOUS
  Administered 2020-10-11: 3 [IU] via SUBCUTANEOUS
  Administered 2020-10-11 (×2): 5 [IU] via SUBCUTANEOUS
  Administered 2020-10-11 (×2): 8 [IU] via SUBCUTANEOUS
  Administered 2020-10-11 – 2020-10-12 (×2): 11 [IU] via SUBCUTANEOUS
  Administered 2020-10-12: 3 [IU] via SUBCUTANEOUS
  Administered 2020-10-12: 11 [IU] via SUBCUTANEOUS
  Administered 2020-10-12: 5 [IU] via SUBCUTANEOUS
  Administered 2020-10-12 – 2020-10-13 (×4): 3 [IU] via SUBCUTANEOUS
  Administered 2020-10-13: 8 [IU] via SUBCUTANEOUS
  Filled 2020-10-07: qty 0.15

## 2020-10-07 MED ORDER — HYDROMORPHONE HCL 1 MG/ML IJ SOLN
0.5000 mg | INTRAMUSCULAR | Status: DC | PRN
  Administered 2020-10-07 – 2020-10-09 (×12): 0.5 mg via INTRAVENOUS
  Filled 2020-10-07: qty 1
  Filled 2020-10-07 (×3): qty 0.5
  Filled 2020-10-07: qty 1
  Filled 2020-10-07 (×4): qty 0.5
  Filled 2020-10-07 (×3): qty 1

## 2020-10-07 MED ORDER — SODIUM CHLORIDE 0.9 % IV SOLN: INTRAVENOUS | Status: AC

## 2020-10-07 MED ORDER — POTASSIUM CHLORIDE 10 MEQ/100ML IV SOLN
10.0000 meq | INTRAVENOUS | Status: AC
  Administered 2020-10-07 (×3): 10 meq via INTRAVENOUS
  Filled 2020-10-07 (×3): qty 100

## 2020-10-07 NOTE — ED Provider Notes (Signed)
[facility] DEPT Provider Note   [name]: [id number] Arrival date & time: 10/07/20  1054     History Chief Complaint  Patient presents with   Abdominal Pain    Deborah [name] is a 55 y.o. female.   Abdominal Pain  Patient presents to the ER for evaluation for abdominal pain chest pain.  Patient states she started having abdominal pain and chest pain that started about 3 hours ago.  She has been having multiple episodes of nausea and vomiting.  Patient states the pain is severe in her abdomen and goes up into her chest.  Patient does have history of gastroparesis but states this episode is worse.  She denies any fevers.  No diarrhea.  No dysuria.  Past Medical History:  Diagnosis Date   Acute back pain with sciatica, left    Acute back pain with sciatica, right    AKI (acute kidney injury) ([place])    Anemia, unspecified    Cancer ([place])    Carcinoid tumor of duodenum    Chronic pain    Chronic systolic CHF (congestive heart failure) (HCC)    Diabetes mellitus    Esophageal reflux    Fibromyalgia    Gastric ulcer    Gastroparesis    Gout    Hyperlipidemia    Hypertension    Lumbosacral stenosis    NICM (nonischemic cardiomyopathy) (HCC)    Obesity    PAF (paroxysmal atrial fibrillation) ([place])    Stroke ([place]) 02/2011   Vitamin B12 deficiency anemia     Patient Active Problem List   Diagnosis Date Noted   DKA (diabetic ketoacidosis) ([place]) 07/21/2020   ARF (acute renal failure) ([place]) 07/21/2020   Vaginal yeast infection 01/04/2020   AKI (acute kidney injury) ([place]) 11/27/2019   Chronic pain 09/04/2019   Malignant carcinoid tumor of duodenum ([place])    Nonalcoholic fatty liver disease 06/05/2019   Chronic diarrhea    COPD exacerbation ([place]) 05/08/2019   Acute on chronic HFrEF (heart failure with reduced ejection fraction) ([place])    Restrictive lung disease secondary to obesity    History of gastric ulcer    Uncontrolled type 2 diabetes  mellitus with hyperglycemia ([place])    Fibroid uterus 77/93/9030   Chronic systolic CHF (congestive heart failure) ([place]) 12/19/2018   Intractable nausea and vomiting 12/17/2018   Hypoxia 12/17/2018   Abdominal pain 07/17/2018   Cardiac arrest ([place]) 11/29/2017   Leukocytosis 11/29/2017   Anxiety 11/29/2017   Stroke (cerebrum) ([place]) 03/19/2017   GERD (gastroesophageal reflux disease) 03/19/2017   Depression 03/19/2017   Morbid obesity ([place])    Normocytic normochromic anemia 08/16/2016   Diabetic gastroparesis ([place]) 06/05/2016   Gout 06/05/2016   Chest pain 09/26/2015   Hypokalemia 09/26/2015   Hypomagnesemia 09/26/2015   Nausea and vomiting 08/20/2015   DKA (diabetic ketoacidoses) 05/25/2015   Uncontrolled type 2 diabetes mellitus with diabetic neuropathy, with long-term current use of insulin ([place]) 05/25/2015   Type 2 diabetes mellitus with hyperglycemia, with long-term current use of insulin ([place]) 05/25/2015   CKD (chronic kidney disease), stage II 05/25/2015   Essential hypertension, benign 09/28/2013    Past Surgical History:  Procedure Laterality Date   BIOPSY  07/27/2019   Procedure: BIOPSY;  Surgeon: Clarene Essex, MD;  Location: [facility];  Service: Endoscopy;;   BIOPSY  07/30/2019   Procedure: BIOPSY;  Surgeon: Otis Brace, MD;  Location: [facility];  Service: Gastroenterology;;   CATARACT EXTRACTION  01/2014   CHOLECYSTECTOMY  COLONOSCOPY WITH PROPOFOL N/A 07/30/2019   Procedure: COLONOSCOPY WITH PROPOFOL;  Surgeon: Otis Brace, MD;  Location: [facility];  Service: Gastroenterology;  Laterality: N/A;   ESOPHAGOGASTRODUODENOSCOPY N/A 07/27/2019   Procedure: ESOPHAGOGASTRODUODENOSCOPY (EGD);  Surgeon: Clarene Essex, MD;  Location: [facility];  Service: Endoscopy;  Laterality: N/A;   ESOPHAGOGASTRODUODENOSCOPY N/A 07/26/2020   Procedure: ESOPHAGOGASTRODUODENOSCOPY (EGD);  Surgeon: Arta Silence, MD;  Location: [facility];  Service: Endoscopy;  Laterality:  N/A;   ESOPHAGOGASTRODUODENOSCOPY (EGD) WITH PROPOFOL N/A 08/02/2019   Procedure: ESOPHAGOGASTRODUODENOSCOPY (EGD) WITH PROPOFOL;  Surgeon: Otis Brace, MD;  Location: [facility];  Service: Gastroenterology;  Laterality: N/A;   HEMOSTASIS CLIP PLACEMENT  08/02/2019   Procedure: HEMOSTASIS CLIP PLACEMENT;  Surgeon: Otis Brace, MD;  Location: [facility];  Service: Gastroenterology;;   POLYPECTOMY  07/30/2019   Procedure: POLYPECTOMY;  Surgeon: Otis Brace, MD;  Location: [facility];  Service: Gastroenterology;;   POLYPECTOMY  08/02/2019   Procedure: POLYPECTOMY;  Surgeon: Otis Brace, MD;  Location: [facility];  Service: Gastroenterology;;     OB History     Gravida  0   Para  0   Term  0   Preterm  0   AB  0   Living  0      SAB  0   IAB  0   Ectopic  0   Multiple  0   Live Births  0           Family History  Problem Relation Age of Onset   Diabetes Mother    Diabetes Father    Heart disease Father    Diabetes Sister    Congestive Heart Failure Sister 63   Diabetes Brother     Social History   Tobacco Use   Smoking status: Never   Smokeless tobacco: Never  Vaping Use   Vaping Use: Never used  Substance Use Topics   Alcohol use: No   Drug use: No    Home Medications Prior to Admission medications   Medication Sig Start Date End Date Taking? Authorizing Provider  albuterol (PROVENTIL) (2.5 MG/3ML) 0.083% nebulizer solution Take 3 mLs (2.5 mg total) by nebulization every 6 (six) hours as needed for wheezing or shortness of breath. 04/06/19   Scot Jun, FNP  allopurinol (ZYLOPRIM) 100 MG tablet Take 100 mg by mouth daily.    [provider]  ALPRAZolam Duanne Moron) 1 MG tablet Take 1 tablet (1 mg total) by mouth 2 (two) times daily as needed for anxiety. 07/27/20   Shelly Coss, MD  ASPIRIN LOW DOSE 81 MG EC tablet TAKE ONE TABLET BY MOUTH ONCE DAILY (AM) 08/11/20   Meccariello, Bernita Raisin, DO  atorvastatin  (LIPITOR) 10 MG tablet TAKE ONE TABLET BY MOUTH ONCE DAILY (AM) Patient taking differently: Take 10 mg by mouth daily. 11/14/19   Meccariello, Bernita Raisin, DO  blood glucose meter kit and supplies KIT Dispense based on patient and insurance preference. Use up to four times daily as directed. (FOR ICD-9 250.00, 250.01). Patient taking differently: Inject 1 each into the skin See admin instructions. Dispense based on patient and insurance preference. Use up to four times daily as directed. (FOR ICD-9 250.00, 250.01). 12/13/18   [name], DO  dicyclomine (BENTYL) 20 MG tablet Take 1 tablet (20 mg total) by mouth 2 (two) times daily. 09/14/20   Tedd Sias, PA  DULoxetine HCl 40 MG CPEP TAKE ONE CAPSULE BY MOUTH TWICE DAILY 08/11/20   Meccariello, Bernita Raisin, DO  EASY  COMFORT PEN NEEDLES 31G X 5 MM MISC USE 3 TIMES A DAY FOR INSULIN ADMINISTRATION Patient taking differently: 1 each by Other route 3 (three) times daily. 11/14/19   Meccariello, Mel Almond J, DO  ELIQUIS 5 MG TABS tablet TAKE 1 TABLET BY MOUTH 2 (TWO) TIMES DAILY. Patient taking differently: Take 5 mg by mouth 2 (two) times daily. 12/20/19   Meccariello, Bernita Raisin, DO  FARXIGA 10 MG TABS tablet Take 10 mg by mouth daily. 10/02/19   [provider]  ferrous sulfate 325 (65 FE) MG tablet Take 1 tablet (325 mg total) by mouth daily with breakfast. 07/27/20 08/26/20  Shelly Coss, MD  fluticasone (FLONASE) 50 MCG/ACT nasal spray Place 2 sprays into both nostrils daily as needed for allergies or rhinitis. 12/19/18   Rai, Vernelle Emerald, MD  hydrALAZINE (APRESOLINE) 50 MG tablet Take 1 tablet (50 mg total) by mouth 3 (three) times daily. 09/10/20   Alcus Dad, MD  HYDROmorphone (DILAUDID) 2 MG tablet Take 1 tablet (2 mg total) by mouth daily as needed for severe pain. 08/27/20   Bayard Hugger, NP  hydrOXYzine (ATARAX/VISTARIL) 10 MG tablet Take 1 tablet (10 mg total) by mouth 3 (three) times daily as needed. 11/15/19   Meccariello, Bernita Raisin, DO   isosorbide mononitrate (IMDUR) 60 MG 24 hr tablet TAKE 1 TABLET (60 MG TOTAL) BY MOUTH DAILY. 09/10/20   Alcus Dad, MD  LANTUS SOLOSTAR 100 UNIT/ML Solostar Pen Inject 32 Units into the skin at bedtime. Patient taking differently: Inject 40 Units into the skin at bedtime. 01/28/20 02/29/20  Raiford Noble Latif, DO  melatonin 3 MG TABS tablet TAKE 2 TABLETS (6 MG TOTAL) BY MOUTH AT BEDTIME. 08/11/20   Meccariello, Bernita Raisin, DO  metoprolol succinate (TOPROL-XL) 50 MG 24 hr tablet Take 1 tablet (50 mg total) by mouth at bedtime. 09/10/20   Alcus Dad, MD  nitroGLYCERIN (NITROSTAT) 0.4 MG SL tablet Place 1 tablet (0.4 mg total) under the tongue every 5 (five) minutes as needed for chest pain. 12/13/18   Lockamy, Timothy, DO  NOVOLOG FLEXPEN 100 UNIT/ML FlexPen INJECT 30 UNITS INTO THE SKIN IN THE MORNING, AT NOON, IN THE EVENING, AND AT BEDTIME. 09/10/20   Alcus Dad, MD  ondansetron (ZOFRAN ODT) 4 MG disintegrating tablet Take 1 tablet (4 mg total) by mouth every 8 (eight) hours as needed for nausea or vomiting. 09/14/20   F[place]aw, Kathleene Hazel, PA  ondansetron (ZOFRAN) 4 MG tablet Take 1 tablet (4 mg total) by mouth every 6 (six) hours as needed for nausea. 07/01/20   Raulkar, Clide Deutscher, MD  pantoprazole (PROTONIX) 40 MG tablet Take 1 tablet (40 mg total) by mouth 2 (two) times daily. 08/11/20   Meccariello, Bernita Raisin, DO  potassium chloride (KLOR-CON) 10 MEQ tablet Take 1 tablet (10 mEq total) by mouth daily for 4 days. 09/14/20 09/18/20  Tedd Sias, PA  promethazine (PHENERGAN) 12.5 MG tablet Take 1 tablet (12.5 mg total) by mouth every 6 (six) hours as needed for nausea or vomiting. 07/27/20   Shelly Coss, MD  torsemide (DEMADEX) 20 MG tablet Take 2 tablets (40 mg total) by mouth daily. 07/27/20   Shelly Coss, MD    Allergies    Diazepam, Gabapentin, Iodinated diagnostic agents, Isovue [iopamidol], Lisinopril, Metoclopramide, Nsaids, Penicillins, Tolmetin, Aspirin, Cyclobenzaprine,  Rifamycins, Acetaminophen, and Tramadol  Review of Systems   Review of Systems  Gastrointestinal:  Positive for abdominal pain.  All other systems reviewed and are negative.  Physical Exam  Updated Vital Signs BP (!) 187/115   Pulse (!) 106   Temp 99.4 F (37.4 C)   Resp 16   LMP 10/10/2012   SpO2 100%   Physical Exam Vitals and nursing note reviewed.  Constitutional:      General: She is in acute distress.     Appearance: She is well-developed. She is ill-appearing.     Comments: Elevated BMI  HENT:     Head: Normocephalic and atraumatic.     Right Ear: External ear normal.     Left Ear: External ear normal.  Eyes:     General: No scleral icterus.       Right eye: No discharge.        Left eye: No discharge.     Conjunctiva/sclera: Conjunctivae normal.  Neck:     Trachea: No tracheal deviation.  Cardiovascular:     Rate and Rhythm: Normal rate and regular rhythm.  Pulmonary:     Effort: Pulmonary effort is normal. No respiratory distress.     Breath sounds: Normal breath sounds. No stridor. No wheezing or rales.  Abdominal:     General: Bowel sounds are normal. There is no distension.     Palpations: Abdomen is soft.     Tenderness: There is generalized abdominal tenderness. There is no guarding or rebound.  Musculoskeletal:        General: No tenderness or deformity.     Cervical back: Neck supple.  Skin:    General: Skin is warm and dry.     Findings: No rash.  Neurological:     General: No focal deficit present.     Mental Status: She is alert.     Cranial Nerves: No cranial nerve deficit (no facial droop, extraocular movements intact, no slurred speech).     Sensory: No sensory deficit.     Motor: No abnormal muscle tone or seizure activity.     Coordination: Coordination normal.  Psychiatric:        Mood and Affect: Mood normal.    ED Results / Procedures / Treatments   Labs (all labs ordered are listed, but only abnormal results are displayed) Labs  Reviewed  COMPREHENSIVE METABOLIC PANEL - Abnormal; Notable for the following components:      Result Value   Potassium 2.6 (*)    CO2 17 (*)    Glucose, Bld 218 (*)    Creatinine, Ser 2.01 (*)    Calcium 8.5 (*)    Total Protein 8.5 (*)    GFR, Estimated 29 (*)    All other components within normal limits  CBC WITH DIFFERENTIAL/PLATELET - Abnormal; Notable for the following components:   WBC 15.5 (*)    RBC 3.08 (*)    Hemoglobin 8.6 (*)    HCT 27.8 (*)    RDW 16.5 (*)    Platelets 551 (*)    Neutro Abs 12.1 (*)    Abs Immature Granulocytes 0.15 (*)    All other components within normal limits  TROPONIN I (HIGH SENSITIVITY) - Abnormal; Notable for the following components:   Troponin I (High Sensitivity) 30 (*)    All other components within normal limits  LIPASE, BLOOD  URINALYSIS, ROUTINE W REFLEX MICROSCOPIC  RAPID URINE DRUG SCREEN, HOSP PERFORMED  TROPONIN I (HIGH SENSITIVITY)    EKG None  Radiology DG Abd Acute W/Chest  Result Date: 10/07/2020 CLINICAL DATA:  Abdominal and chest pain for 3 hours. EXAM: DG ABDOMEN ACUTE WITH 1 VIEW CHEST COMPARISON:  Single-view of the chest and CT abdomen and pelvis 09/14/2020. FINDINGS: Single-view of the chest demonstrates clear lungs. Heart size is normal. No pneumothorax or pleural fluid. No acute or focal bony abnormality. Two views of the abdomen show no free intraperitoneal air. The bowel gas pattern is nonobstructive. No acute or focal bony abnormality. IMPRESSION: Negative exam. Electronically Signed   By: Inge Rise M.D.   On: 10/07/2020 14:04    Procedures Procedures   Medications Ordered in ED Medications  sodium chloride 0.9 % bolus 1,000 mL (1,000 mLs Intravenous Bolus 10/07/20 1308)    And  0.9 %  sodium chloride infusion ( Intravenous New Bag/Given 10/07/20 1308)  labetalol (NORMODYNE) injection 10 mg (has no administration in time range)  potassium chloride 10 mEq in 100 mL IVPB (has no administration in time  range)  haloperidol lactate (HALDOL) injection 2 mg (has no administration in time range)  HYDROmorphone (DILAUDID) injection 1 mg (has no administration in time range)  HYDROmorphone (DILAUDID) injection 1 mg (1 mg Intravenous Given 10/07/20 1307)  pantoprazole (PROTONIX) injection 40 mg (40 mg Intravenous Given 10/07/20 1307)  ondansetron (ZOFRAN) injection 4 mg (4 mg Intravenous Given 10/07/20 1307)    ED Course  I have reviewed the triage vital signs and the nursing notes.  Pertinent labs & imaging results that were available during my care of the patient were reviewed by me and considered in my medical decision making (see chart for details).  Clinical Course as of 10/07/20 1523  Tue Oct 07, 2020  1421 White blood cell count elevated.  Similar to previous.  Anemia stable [JK]  1422 Acute abdominal series without signs of obstruction or free air [JK]  1516 Creatinine is similar to previous. [JK]  1516 Troponin slightly elevated but similar to previous [JK]  1517 CT scan on July 10 of this year without acute findings [JK]  1519 Patient states she is still hurting. [JK]    Clinical Course User Index [JK] Dorie Rank, MD   MDM Rules/Calculators/A&P                           Patient presents to the ED with complaints of abdominal pain.  Patient has history of gastroparesis and recurrent abdominal pain.  Patient was seen last month for similar presentation.  CT scan without acute findings.  Patient is complaining of severe abdominal discomfort.  She is anxious and hyperventilating.  No acute changes noted on EKG.  Patient's laboratory test show chronic abnormalities.  Patient is hypertensive.  She was also hypokalemic.  Will order additional pain medications as well as potassium replacement.  We will continue with symptomatic treatment. Need to reassess.  Care turned over to oncoming team Final Clinical Impression(s) / ED Diagnoses Final diagnoses:  Abdominal pain, unspecified abdominal  location  Hypokalemia     Dorie Rank, MD 10/07/20 1523

## 2020-10-07 NOTE — H&P (Signed)
Has noChronic History and Physical    Deborah [name] IRW:[id number] DOB: 07-07-1965 DOA: 10/07/2020  PCP: Pcp, No   Patient coming from: Home   Chief Complaint: Abdominal pain, N/V   HPI: Deborah [name] is a 55 y.o. female with medical history significant for systolic CHF, chronic kidney disease stage IIIb, carcinoid tumor of the duodenum, uncontrolled type 2 diabetes mellitus, gastroparesis, chronic pain, cirrhosis, and paroxysmal atrial fibrillation on Eliquis, now presenting to the emergency department for evaluation of severe abdominal pain, nausea, and vomiting.  Patient reports that she developed abdominal pain over the past day that has become severe and she ran out of her home pain medications.  Last bowel movement was 3 days ago and loose.  She reports chronic diarrhea.  She continues to pass flatus.  She denies fevers or chills.  She denies dysuria or flank pain.  Pain is severe, beginning in the mid abdomen, and radiating towards her chest, similar to pain she has had frequently in the past.  Taking any oral medications including Eliquis for the past 3 or 4 days due to her nausea and vomiting.  Denies melena, hematochezia, or hematemesis.  ED Course: Upon arrival to the ED, patient is found to be afebrile, saturating well on room air, slightly tachycardic, and with stable blood pressure.  EKG features sinus tachycardia with rate 110 and QTc interval 502 ms.  Radiographs of the chest and abdomen are negative.  Chemistry panel with potassium 2.6, glucose 218, and creatinine 2.01.  CBC with WBC 15,500, hemoglobin 8.6, and platelets 551,000, all of which appear similar to prior values.  Patient was given a liter of saline, IV PPI, IV labetalol, IV Haldol, Dilaudid, and potassium in the ED. She continues to have severe symptoms and hospitalist asked to admit.  Review of Systems:  All other systems reviewed and apart from HPI, are negative.  Past Medical History:  Diagnosis Date    Acute back pain with sciatica, left    Acute back pain with sciatica, right    AKI (acute kidney injury) ([facility])    Anemia, unspecified    Cancer (HCC)    Carcinoid tumor of duodenum    Chronic pain    Chronic systolic CHF (congestive heart failure) (HCC)    Diabetes mellitus    Esophageal reflux    Fibromyalgia    Gastric ulcer    Gastroparesis    Gout    Hyperlipidemia    Hypertension    Lumbosacral stenosis    NICM (nonischemic cardiomyopathy) (HCC)    Obesity    PAF (paroxysmal atrial fibrillation) ([facility])    Stroke ([facility]) 02/2011   Vitamin B12 deficiency anemia     Past Surgical History:  Procedure Laterality Date   BIOPSY  07/27/2019   Procedure: BIOPSY;  Surgeon: Clarene Essex, MD;  Location: [facility];  Service: Endoscopy;;   BIOPSY  07/30/2019   Procedure: BIOPSY;  Surgeon: Otis Brace, MD;  Location: [facility];  Service: Gastroenterology;;   CATARACT EXTRACTION  01/2014   CHOLECYSTECTOMY     COLONOSCOPY WITH PROPOFOL N/A 07/30/2019   Procedure: COLONOSCOPY WITH PROPOFOL;  Surgeon: Otis Brace, MD;  Location: [facility];  Service: Gastroenterology;  Laterality: N/A;   ESOPHAGOGASTRODUODENOSCOPY N/A 07/27/2019   Procedure: ESOPHAGOGASTRODUODENOSCOPY (EGD);  Surgeon: Clarene Essex, MD;  Location: [facility];  Service: Endoscopy;  Laterality: N/A;   ESOPHAGOGASTRODUODENOSCOPY N/A 07/26/2020   Procedure: ESOPHAGOGASTRODUODENOSCOPY (EGD);  Surgeon: Arta Silence, MD;  Location: [facility];  Service: Endoscopy;  Laterality:  N/A;   ESOPHAGOGASTRODUODENOSCOPY (EGD) WITH PROPOFOL N/A 08/02/2019   Procedure: ESOPHAGOGASTRODUODENOSCOPY (EGD) WITH PROPOFOL;  Surgeon: Otis Brace, MD;  Location: [facility];  Service: Gastroenterology;  Laterality: N/A;   HEMOSTASIS CLIP PLACEMENT  08/02/2019   Procedure: HEMOSTASIS CLIP PLACEMENT;  Surgeon: Otis Brace, MD;  Location: [facility];  Service: Gastroenterology;;   POLYPECTOMY  07/30/2019   Procedure:  POLYPECTOMY;  Surgeon: Otis Brace, MD;  Location: [facility];  Service: Gastroenterology;;   POLYPECTOMY  08/02/2019   Procedure: POLYPECTOMY;  Surgeon: Otis Brace, MD;  Location: [facility];  Service: Gastroenterology;;    Social History:   reports that she has never smoked. She has never used smokeless tobacco. She reports that she does not drink alcohol and does not use drugs.  Allergies  Allergen Reactions   Diazepam [name]   Gabapentin [name] and Swelling    Other reaction(s): Unknown   Iodinated Diagnostic Agents Anaphylaxis    11/29/17 Cardiac arrest 1 min after IV contrast, possible allergy vs vasovagal episode Iopamidol  Anaphylaxis  High 11/28/2017  Patient had seizure like activity and then code post 100 cc of isovue 300     Isovue [Iopamidol] Anaphylaxis    11/28/17 Patient had seizure like activity and then 1 min code after 100 cc of isovue 300. Possible contrast allergy vs vasovagal episode   Lisinopril Anaphylaxis    Tongue and mouth swelling   Metoclopramide Other (See Comments)    Tardive dyskinesia   Nsaids Anaphylaxis and Other (See Comments)    ULCER   Penicillins Palpitations    Has patient had a PCN reaction causing immediate rash, facial/tongue/throat swelling, SOB or lightheadedness with hypotension: Yes, heart races Has patient had a PCN reaction causing severe rash involving mucus membranes or skin necrosis: No Has patient had a PCN reaction that required hospitalization: Yes  Has patient had a PCN reaction occurring within the last 10 years: No    Tolmetin Nausea Only and Other (See Comments)    ULCER   Aspirin Other (See Comments)    Irritates stomach ulcer    Cyclobenzaprine     Other reaction(s): Unknown   Rifamycins    Acetaminophen Nausea Only and Other (See Comments)    Irritates stomach ulcer; Abdominal pain   Tramadol Nausea And Vomiting    Family History  Problem Relation Age of Onset    Diabetes Mother    Diabetes Father    Heart disease Father    Diabetes Sister    Congestive Heart Failure Sister 31   Diabetes Brother      Prior to Admission medications   Medication Sig Start Date End Date Taking? Authorizing Provider  metoprolol tartrate (LOPRESSOR) 25 MG tablet Take 25 mg by mouth daily.   Yes [provider]  spironolactone (ALDACTONE) 25 MG tablet Take 12.5 mg by mouth daily.   Yes [provider]  albuterol (PROVENTIL) (2.5 MG/3ML) 0.083% nebulizer solution Take 3 mLs (2.5 mg total) by nebulization every 6 (six) hours as needed for wheezing or shortness of breath. 04/06/19   Scot Jun, FNP  allopurinol (ZYLOPRIM) 100 MG tablet Take 100 mg by mouth daily.    [provider]  ALPRAZolam Duanne Moron) 1 MG tablet Take 1 tablet (1 mg total) by mouth 2 (two) times daily as needed for anxiety. 07/27/20   Shelly Coss, MD  ASPIRIN LOW DOSE 81 MG EC tablet TAKE ONE TABLET BY MOUTH ONCE DAILY (AM) 08/11/20   Meccariello, Bernita Raisin,  DO  atorvastatin (LIPITOR) 10 MG tablet TAKE ONE TABLET BY MOUTH ONCE DAILY (AM) Patient taking differently: Take 10 mg by mouth daily. 11/14/19   Meccariello, Bernita Raisin, DO  blood glucose meter kit and supplies KIT Dispense based on patient and insurance preference. Use up to four times daily as directed. (FOR ICD-9 250.00, 250.01). Patient taking differently: Inject 1 each into the skin See admin instructions. Dispense based on patient and insurance preference. Use up to four times daily as directed. (FOR ICD-9 250.00, 250.01). 12/13/18   [name], DO  dicyclomine (BENTYL) 20 MG tablet Take 1 tablet (20 mg total) by mouth 2 (two) times daily. 09/14/20   Tedd Sias, PA  DULoxetine HCl 40 MG CPEP TAKE ONE CAPSULE BY MOUTH TWICE DAILY 08/11/20   Meccariello, Bernita Raisin, DO  EASY COMFORT PEN NEEDLES 31G X 5 MM MISC USE 3 TIMES A DAY FOR INSULIN ADMINISTRATION Patient taking differently: 1 each by Other route 3 (three)  times daily. 11/14/19   Meccariello, Mel Almond J, DO  ELIQUIS 5 MG TABS tablet TAKE 1 TABLET BY MOUTH 2 (TWO) TIMES DAILY. Patient taking differently: Take 5 mg by mouth 2 (two) times daily. 12/20/19   Meccariello, Bernita Raisin, DO  FARXIGA 10 MG TABS tablet Take 10 mg by mouth daily. 10/02/19   [provider]  ferrous sulfate 325 (65 FE) MG tablet Take 1 tablet (325 mg total) by mouth daily with breakfast. 07/27/20 08/26/20  Shelly Coss, MD  fluticasone (FLONASE) 50 MCG/ACT nasal spray Place 2 sprays into both nostrils daily as needed for allergies or rhinitis. 12/19/18   Rai, Vernelle Emerald, MD  hydrALAZINE (APRESOLINE) 50 MG tablet Take 1 tablet (50 mg total) by mouth 3 (three) times daily. 09/10/20   Alcus Dad, MD  HYDROmorphone (DILAUDID) 2 MG tablet Take 1 tablet (2 mg total) by mouth daily as needed for severe pain. 08/27/20   Bayard Hugger, NP  hydrOXYzine (ATARAX/VISTARIL) 10 MG tablet Take 1 tablet (10 mg total) by mouth 3 (three) times daily as needed. 11/15/19   Meccariello, Bernita Raisin, DO  isosorbide mononitrate (IMDUR) 60 MG 24 hr tablet TAKE 1 TABLET (60 MG TOTAL) BY MOUTH DAILY. 09/10/20   Alcus Dad, MD  LANTUS SOLOSTAR 100 UNIT/ML Solostar Pen Inject 32 Units into the skin at bedtime. Patient taking differently: Inject 40 Units into the skin at bedtime. 01/28/20 02/29/20  Raiford Noble Latif, DO  melatonin 3 MG TABS tablet TAKE 2 TABLETS (6 MG TOTAL) BY MOUTH AT BEDTIME. 08/11/20   Meccariello, Bernita Raisin, DO  metoprolol succinate (TOPROL-XL) 50 MG 24 hr tablet Take 1 tablet (50 mg total) by mouth at bedtime. 09/10/20   Alcus Dad, MD  nitroGLYCERIN (NITROSTAT) 0.4 MG SL tablet Place 1 tablet (0.4 mg total) under the tongue every 5 (five) minutes as needed for chest pain. 12/13/18   Lockamy, Randon Somera, DO  NOVOLOG FLEXPEN 100 UNIT/ML FlexPen INJECT 30 UNITS INTO THE SKIN IN THE MORNING, AT NOON, IN THE EVENING, AND AT BEDTIME. 09/10/20   Alcus Dad, MD  ondansetron (ZOFRAN ODT) 4  MG disintegrating tablet Take 1 tablet (4 mg total) by mouth every 8 (eight) hours as needed for nausea or vomiting. 09/14/20   Fondaw, Kathleene Hazel, PA  ondansetron (ZOFRAN) 4 MG tablet Take 1 tablet (4 mg total) by mouth every 6 (six) hours as needed for nausea. 07/01/20   Raulkar, Clide Deutscher, MD  pantoprazole (PROTONIX) 40 MG tablet Take 1 tablet (40 mg total)  by mouth 2 (two) times daily. 08/11/20   Meccariello, Bernita Raisin, DO  potassium chloride (KLOR-CON) 10 MEQ tablet Take 1 tablet (10 mEq total) by mouth daily for 4 days. 09/14/20 09/18/20  Tedd Sias, PA  promethazine (PHENERGAN) 12.5 MG tablet Take 1 tablet (12.5 mg total) by mouth every 6 (six) hours as needed for nausea or vomiting. 07/27/20   Shelly Coss, MD  torsemide (DEMADEX) 20 MG tablet Take 2 tablets (40 mg total) by mouth daily. 07/27/20   Shelly Coss, MD    Physical Exam: Vitals:   10/07/20 1827 10/07/20 1830 10/07/20 1930 10/07/20 2030  BP: (!) 139/112 (!) 147/80 (!) 150/91 (!) 111/96  Pulse: 89 85 96 91  Resp: '19 18 17 16  ' Temp:      SpO2: 97% 96% 99% 99%    Constitutional: NAD, calm  Eyes: PERTLA, lids and conjunctivae normal ENMT: Mucous membranes are moist. Posterior pharynx clear of any exudate or lesions.   Neck: supple, no masses  Respiratory:  no wheezing, no crackles.    Cardiovascular: S1 & S2 heard, regular rate and rhythm. No extremity edema.   Abdomen: No distension, soft, tender without rebound pain or guarding. Bowel sounds hypoactive.  Musculoskeletal: no clubbing / cyanosis. No joint deformity upper and lower extremities.   Skin: no significant rashes, lesions, ulcers. Xerosis, poor turgor. Warm, dry, well-perfused. Neurologic: CN 2-12 grossly intact. Sensation intact. Moving all extremities.  Psychiatric: Alert and oriented to person, place, and situation. Pleasant and cooperative.    Labs and Imaging on Admission: I have personally reviewed following labs and imaging studies  CBC: Recent Labs   Lab 10/07/20 1240  WBC 15.5*  NEUTROABS 12.1*  HGB 8.6*  HCT 27.8*  MCV 90.3  PLT 458*   Basic Metabolic Panel: Recent Labs  Lab 10/07/20 1240  NA 136  K 2.6*  CL 107  CO2 17*  GLUCOSE 218*  BUN 16  CREATININE 2.01*  CALCIUM 8.5*   GFR: CrCl cannot be calculated (Unknown ideal weight.). Liver Function Tests: Recent Labs  Lab 10/07/20 1240  AST 15  ALT 11  ALKPHOS 101  BILITOT 0.7  PROT 8.5*  ALBUMIN 3.6   Recent Labs  Lab 10/07/20 1240  LIPASE 28   No results for input(s): AMMONIA in the last 168 hours. Coagulation Profile: No results for input(s): INR, PROTIME in the last 168 hours. Cardiac Enzymes: No results for input(s): CKTOTAL, CKMB, CKMBINDEX, TROPONINI in the last 168 hours. BNP (last 3 results) No results for input(s): PROBNP in the last 8760 hours. HbA1C: No results for input(s): HGBA1C in the last 72 hours. CBG: No results for input(s): GLUCAP in the last 168 hours. Lipid Profile: No results for input(s): CHOL, HDL, LDLCALC, TRIG, CHOLHDL, LDLDIRECT in the last 72 hours. Thyroid Function Tests: No results for input(s): TSH, T4TOTAL, FREET4, T3FREE, THYROIDAB in the last 72 hours. Anemia Panel: No results for input(s): VITAMINB12, FOLATE, FERRITIN, TIBC, IRON, RETICCTPCT in the last 72 hours. Urine analysis:    Component Value Date/Time   COLORURINE STRAW (A) 10/07/2020 1240   APPEARANCEUR CLEAR 10/07/2020 1240   LABSPEC 1.006 10/07/2020 1240   PHURINE 6.0 10/07/2020 1240   GLUCOSEU 50 (A) 10/07/2020 1240   HGBUR SMALL (A) 10/07/2020 1240   BILIRUBINUR NEGATIVE 10/07/2020 1240   KETONESUR NEGATIVE 10/07/2020 1240   PROTEINUR 100 (A) 10/07/2020 1240   UROBILINOGEN 0.2 10/02/2013 2108   NITRITE NEGATIVE 10/07/2020 1240   LEUKOCYTESUR NEGATIVE 10/07/2020 1240   Sepsis  Labs: '@LABRCNTIP' (procalcitonin:4,lacticidven:4) ) Recent Results (from the past 240 hour(s))  Resp Panel by RT-PCR (Flu A&B, Covid) Nasopharyngeal Swab     Status:  None   Collection Time: 10/07/20  6:52 PM   Specimen: Nasopharyngeal Swab; Nasopharyngeal(NP) swabs in vial transport medium  Result Value Ref Range Status   SARS Coronavirus 2 by RT PCR NEGATIVE NEGATIVE Final    Comment: (NOTE) SARS-CoV-2 target nucleic acids are NOT DETECTED.  The SARS-CoV-2 RNA is generally detectable in upper respiratory specimens during the acute phase of infection. The lowest concentration of SARS-CoV-2 viral copies this assay can detect is 138 copies/mL. A negative result does not preclude SARS-Cov-2 infection and should not be used as the sole basis for treatment or other patient management decisions. A negative result may occur with  improper specimen collection/handling, submission of specimen other than nasopharyngeal swab, presence of viral mutation(s) within the areas targeted by this assay, and inadequate number of viral copies(<138 copies/mL). A negative result must be combined with clinical observations, patient history, and epidemiological information. The expected result is Negative.  Fact Sheet for Patients:  EntrepreneurPulse.com.au  Fact Sheet for Healthcare Providers:  IncredibleEmployment.be  This test is no t yet approved or cleared by the [place] FDA and  has been authorized for detection and/or diagnosis of SARS-CoV-2 by FDA under an Emergency Use Authorization (EUA). This EUA will remain  in effect (meaning this test can be used) for the duration of the COVID-19 declaration under Section 564(b)(1) of the Act, 21 U.S.C.section 360bbb-3(b)(1), unless the authorization is terminated  or revoked sooner.       Influenza A by PCR NEGATIVE NEGATIVE Final   Influenza B by PCR NEGATIVE NEGATIVE Final    Comment: (NOTE) The Xpert Xpress SARS-CoV-2/FLU/RSV plus assay is intended as an aid in the diagnosis of influenza from Nasopharyngeal swab specimens and should not be used as a sole basis for  treatment. Nasal washings and aspirates are unacceptable for Xpert Xpress SARS-CoV-2/FLU/RSV testing.  Fact Sheet for Patients: EntrepreneurPulse.com.au  Fact Sheet for Healthcare Providers: IncredibleEmployment.be  This test is not yet approved or cleared by the [place] FDA and has been authorized for detection and/or diagnosis of SARS-CoV-2 by FDA under an Emergency Use Authorization (EUA). This EUA will remain in effect (meaning this test can be used) for the duration of the COVID-19 declaration under Section 564(b)(1) of the Act, 21 U.S.C. section 360bbb-3(b)(1), unless the authorization is terminated or revoked.  Performed at [facility], [place] [address] [place], [place] [address]      Radiological Exams on Admission: DG Abd Acute W/Chest  Result Date: 10/07/2020 CLINICAL DATA:  Abdominal and chest pain for 3 hours. EXAM: DG ABDOMEN ACUTE WITH 1 VIEW CHEST COMPARISON:  Single-view of the chest and CT abdomen and pelvis 09/14/2020. FINDINGS: Single-view of the chest demonstrates clear lungs. Heart size is normal. No pneumothorax or pleural fluid. No acute or focal bony abnormality. Two views of the abdomen show no free intraperitoneal air. The bowel gas pattern is nonobstructive. No acute or focal bony abnormality. IMPRESSION: Negative exam. Electronically Signed   By: Inge Rise M.D.   On: 10/07/2020 14:04    EKG: Independently reviewed. Sinus tachycardia, rate 110, QTc 502 ms.   Assessment/Plan   1. Intractable abdominal pain and N/V  - Patient with hx of chronic abdomina pain and diabetic gastroparesis p/w 3-4 days N/V and worsening abdominal pain   - No acute findings on plain radiographs, symptoms same  as prior episodes of gastroparesis  - She has been unable to tolerate Reglan d/t extrapyramidal sxs and treatment complicated by prolonged QT interval  - Continue supportive care, NPO for now, allow clears  when improving and advance as tolerated    2. AKI superimposed on CKD IIIb  - SCr is 2.01 on admission, up from 1.64 on July 10th  - She likely has acute prerenal azotemia in setting of hypovolemia  - She was given a liter NS in ED, has hx of CHF  - Continue gentle IVF hydration, renally-dose medications, monitor    3. Uncontrolled IDDM  - A1c was 10.5% in May 2022  - Continue CBG checks and insulin    4. Paroxysmal atrial fibrillation  - In sinus rhythm on admission  - CHADS-VASc 6 (CVA x2, gender, CHF, DM, HTN) - Has been unable to take Eliquis for a few days d/t N/V, will use IV heparin for now   5. Chronic systolic CHF  - Appears hypovolemic in setting of  3-4 days N/V  - Given a liter of NS in ED  - Continue gentle IVF hydration, monitor volume status   6. Hypokalemia   - Serum potassium 2.6 secondary to GI-losses  - Replace, give empiric magnesium, repeat chemistries in am   7. Prolonged QT interval  - QTc is 502 ms in ED  - Replace potassium, check magnesium, minimize QT-prolonging medications, repeat EKG in am   8. Hypertension  - Unable to tolerate PO on admission, treat as-needed only fror now   9. Elevated troponin  - Troponin slightly elevated and flat  - She has abdominal pain radiating to chest similar to her prior episodes of gastroparesis, no acute ischemic features on EKG, and suspicion for ACS is very low   10. Anemia  - Appears stable with no apparent bleeding   11. Leukocytosis, thrombocytosis  - Appears to be chronic, no apparent infectious process, will culture if febrile    DVT prophylaxis: Eliquis at home, not taking for 3-4 days d/t N/V, IV heparin for now  Code Status: Full  Level of Care: Level of care: Telemetry Family Communication: None present  Disposition Plan:  Patient is from: Home  Anticipated d/c is to: Home  Anticipated d/c date is: Possibly as early as 10/08/20 Patient currently: pending tolerance of adequate oral intake   Consults called: none  Admission status: Observation     Vianne Bulls, MD [facility]  10/07/2020, 9:16 PM

## 2020-10-07 NOTE — ED Notes (Signed)
Patient continuously screaming "help" despite several attempts to calm patient down and assess needs. This RN explained to patient that the MD needs to assess patient before medications can be administered.

## 2020-10-07 NOTE — Telephone Encounter (Signed)
Patient called states she is out of med and in extreme pain, is at ER, was vomiting. She state she will call to reschedule.

## 2020-10-07 NOTE — ED Provider Notes (Signed)
3:50 PM Care assumed from Dr. Tomi Bamberger.  At time of transfer care, patient is awaiting reassessment after labs.  Patient was found to have hypokalemia and increasing kidney function with dehydration with likely gastroparesis nausea vomiting.  Patient had an x-ray which did not show obstruction and she has had multiple recent CT scans which did not show obstructions reportedly.  Plan of care will be to admit patient as she is not tolerating p.o. and cannot maintain her hydration or be able to take oral potassium at discharge.  Anticipate admission.  6:55 PM Patient's reassessment she is still having nausea, vomiting, abdominal pain and feeling dehydrated.  She reports she cannot tolerate keeping anything down at this time and does not feel safe going home with her continued symptoms.  Per previous plan, if she did not improve, plan will be to admit for further rehydration, pain management, and nausea medication.  Will reorder pain and nausea medicine and will call for admission.  She will need further monitoring of her electrolytes given the continued vomiting to make sure she does not need further IV potassium repletion.  Patient be admitted for dehydration and electrolyte abnormalities.  Clinical Impression: 1. Abdominal pain, unspecified abdominal location   2. Hypokalemia     Disposition: Admit  This note was prepared with assistance of Dragon voice recognition software. Occasional wrong-word or sound-a-like substitutions may have occurred due to the inherent limitations of voice recognition software.     Bleu Moisan, Gwenyth Allegra, MD 10/08/20 [phone]

## 2020-10-07 NOTE — ED Triage Notes (Signed)
[name] c/o abdominal pain & chest pain x3 hours as well as n/v. Hx of gastroparesis, stomach cancer & anxiety.   NSR   4 mg zofran IM given by EMS  BP-170/80

## 2020-10-07 NOTE — Progress Notes (Signed)
ANTICOAGULATION CONSULT NOTE - Initial Consult  Pharmacy Consult for Heparin Indication: atrial fibrillation  Allergies  Allergen Reactions   Diazepam [name]   Gabapentin [name] and Swelling    Other reaction(s): Unknown   Iodinated Diagnostic Agents Anaphylaxis    11/29/17 Cardiac arrest 1 min after IV contrast, possible allergy vs vasovagal episode Iopamidol  Anaphylaxis  High 11/28/2017  Patient had seizure like activity and then code post 100 cc of isovue 300     Isovue [Iopamidol] Anaphylaxis    11/28/17 Patient had seizure like activity and then 1 min code after 100 cc of isovue 300. Possible contrast allergy vs vasovagal episode   Lisinopril Anaphylaxis    Tongue and mouth swelling   Metoclopramide Other (See Comments)    Tardive dyskinesia   Nsaids Anaphylaxis and Other (See Comments)    ULCER   Penicillins Palpitations    Has patient had a PCN reaction causing immediate rash, facial/tongue/throat swelling, SOB or lightheadedness with hypotension: Yes, heart races Has patient had a PCN reaction causing severe rash involving mucus membranes or skin necrosis: No Has patient had a PCN reaction that required hospitalization: Yes  Has patient had a PCN reaction occurring within the last 10 years: No    Tolmetin Nausea Only and Other (See Comments)    ULCER   Aspirin Other (See Comments)    Irritates stomach ulcer    Cyclobenzaprine     Other reaction(s): Unknown   Rifamycins    Acetaminophen Nausea Only and Other (See Comments)    Irritates stomach ulcer; Abdominal pain   Tramadol Nausea And Vomiting    Patient Measurements:   Heparin Dosing Weight: 92 kg  Vital Signs: Temp: 99.4 F (37.4 C) (08/02 1118) BP: 146/72 (08/02 2130) Pulse Rate: 94 (08/02 2130)  Labs: Recent Labs    10/07/20 1240 10/07/20 1442  HGB 8.6*  --   HCT 27.8*  --   PLT 551*  --   CREATININE 2.01*  --   TROPONINIHS 30* 30*    CrCl cannot be calculated  (Unknown ideal weight.).   Medical History: Past Medical History:  Diagnosis Date   Acute back pain with sciatica, left    Acute back pain with sciatica, right    AKI (acute kidney injury) ([place])    Anemia, unspecified    Cancer (HCC)    Carcinoid tumor of duodenum    Chronic pain    Chronic systolic CHF (congestive heart failure) (HCC)    Diabetes mellitus    Esophageal reflux    Fibromyalgia    Gastric ulcer    Gastroparesis    Gout    Hyperlipidemia    Hypertension    Lumbosacral stenosis    NICM (nonischemic cardiomyopathy) (HCC)    Obesity    PAF (paroxysmal atrial fibrillation) ([place])    Stroke ([place]) 02/2011   Vitamin B12 deficiency anemia     Medications:  Eliquis 5mg  PO BID PTA- last dose 7/30  Assessment: 55 yo M with PAF on chronic Eliquis which she has not taken since 10/04/20 due to nausea/vomiting.  Currently in NSR AKI with GFR ~29 CBC- Hg 8.6- low but patient's baseline. Pltc 551- elevated at baseline.  Baseline coags: INR, apTT, heparin level at baseline.  Goal of Therapy:  Heparin level 0.3-0.7 units/ml Monitor platelets by anticoagulation protocol: Yes   Plan:  Start IV Heparin 4000 units bolus then infusion at 1200 units/hr Check 8h heparin level Daily heparin level & CBC  while on heparin Monitor for s/sx of bleeding F/U for ability to resume Eliquis  Netta Cedars PharmD 10/07/2020,10:20 PM

## 2020-10-07 NOTE — ED Notes (Signed)
Pt resting in bed quietly at present. No needs voiced.

## 2020-10-08 DIAGNOSIS — Z886 Allergy status to analgesic agent status: Secondary | ICD-10-CM | POA: Diagnosis not present

## 2020-10-08 DIAGNOSIS — I480 Paroxysmal atrial fibrillation: Secondary | ICD-10-CM | POA: Diagnosis present

## 2020-10-08 DIAGNOSIS — Z888 Allergy status to other drugs, medicaments and biological substances status: Secondary | ICD-10-CM | POA: Diagnosis not present

## 2020-10-08 DIAGNOSIS — D75839 Thrombocytosis, unspecified: Secondary | ICD-10-CM | POA: Diagnosis present

## 2020-10-08 DIAGNOSIS — E861 Hypovolemia: Secondary | ICD-10-CM | POA: Diagnosis present

## 2020-10-08 DIAGNOSIS — R778 Other specified abnormalities of plasma proteins: Secondary | ICD-10-CM | POA: Diagnosis present

## 2020-10-08 DIAGNOSIS — E1143 Type 2 diabetes mellitus with diabetic autonomic (poly)neuropathy: Secondary | ICD-10-CM | POA: Diagnosis present

## 2020-10-08 DIAGNOSIS — Z6841 Body Mass Index (BMI) 40.0 and over, adult: Secondary | ICD-10-CM | POA: Diagnosis not present

## 2020-10-08 DIAGNOSIS — D649 Anemia, unspecified: Secondary | ICD-10-CM | POA: Diagnosis not present

## 2020-10-08 DIAGNOSIS — R112 Nausea with vomiting, unspecified: Secondary | ICD-10-CM

## 2020-10-08 DIAGNOSIS — E860 Dehydration: Secondary | ICD-10-CM | POA: Diagnosis present

## 2020-10-08 DIAGNOSIS — N179 Acute kidney failure, unspecified: Secondary | ICD-10-CM | POA: Diagnosis present

## 2020-10-08 DIAGNOSIS — E1122 Type 2 diabetes mellitus with diabetic chronic kidney disease: Secondary | ICD-10-CM | POA: Diagnosis present

## 2020-10-08 DIAGNOSIS — K3184 Gastroparesis: Secondary | ICD-10-CM | POA: Diagnosis present

## 2020-10-08 DIAGNOSIS — I130 Hypertensive heart and chronic kidney disease with heart failure and stage 1 through stage 4 chronic kidney disease, or unspecified chronic kidney disease: Secondary | ICD-10-CM | POA: Diagnosis present

## 2020-10-08 DIAGNOSIS — R7989 Other specified abnormal findings of blood chemistry: Secondary | ICD-10-CM | POA: Diagnosis present

## 2020-10-08 DIAGNOSIS — I428 Other cardiomyopathies: Secondary | ICD-10-CM | POA: Diagnosis present

## 2020-10-08 DIAGNOSIS — Z23 Encounter for immunization: Secondary | ICD-10-CM | POA: Diagnosis not present

## 2020-10-08 DIAGNOSIS — E1165 Type 2 diabetes mellitus with hyperglycemia: Secondary | ICD-10-CM | POA: Diagnosis present

## 2020-10-08 DIAGNOSIS — Z20822 Contact with and (suspected) exposure to covid-19: Secondary | ICD-10-CM | POA: Diagnosis present

## 2020-10-08 DIAGNOSIS — D631 Anemia in chronic kidney disease: Secondary | ICD-10-CM | POA: Diagnosis present

## 2020-10-08 DIAGNOSIS — N1832 Chronic kidney disease, stage 3b: Secondary | ICD-10-CM | POA: Diagnosis present

## 2020-10-08 DIAGNOSIS — Z885 Allergy status to narcotic agent status: Secondary | ICD-10-CM | POA: Diagnosis not present

## 2020-10-08 DIAGNOSIS — I5022 Chronic systolic (congestive) heart failure: Secondary | ICD-10-CM | POA: Diagnosis present

## 2020-10-08 DIAGNOSIS — E876 Hypokalemia: Secondary | ICD-10-CM | POA: Diagnosis present

## 2020-10-08 LAB — COMPREHENSIVE METABOLIC PANEL
ALT: 11 U/L (ref 0–44)
AST: 22 U/L (ref 15–41)
Albumin: 3.1 g/dL — ABNORMAL LOW (ref 3.5–5.0)
Alkaline Phosphatase: 95 U/L (ref 38–126)
Anion gap: 10 (ref 5–15)
BUN: 15 mg/dL (ref 6–20)
CO2: 20 mmol/L — ABNORMAL LOW (ref 22–32)
Calcium: 8.1 mg/dL — ABNORMAL LOW (ref 8.9–10.3)
Chloride: 109 mmol/L (ref 98–111)
Creatinine, Ser: 1.63 mg/dL — ABNORMAL HIGH (ref 0.44–1.00)
GFR, Estimated: 37 mL/min — ABNORMAL LOW (ref >60)
Glucose, Bld: 182 mg/dL — ABNORMAL HIGH (ref 70–99)
Potassium: 4 mmol/L (ref 3.5–5.1)
Sodium: 139 mmol/L (ref 135–145)
Total Bilirubin: 1.2 mg/dL (ref 0.3–1.2)
Total Protein: 7.9 g/dL (ref 6.5–8.1)

## 2020-10-08 LAB — HEPARIN LEVEL (UNFRACTIONATED)
Heparin Unfractionated: <0.1 IU/mL — ABNORMAL LOW (ref 0.30–0.70)
Heparin Unfractionated: <0.1 IU/mL — ABNORMAL LOW (ref 0.30–0.70)

## 2020-10-08 LAB — CBC
HCT: 25.5 % — ABNORMAL LOW (ref 36.0–46.0)
HCT: 25.8 % — ABNORMAL LOW (ref 36.0–46.0)
Hemoglobin: 7.8 g/dL — ABNORMAL LOW (ref 12.0–15.0)
Hemoglobin: 7.9 g/dL — ABNORMAL LOW (ref 12.0–15.0)
MCH: 27.7 pg (ref 26.0–34.0)
MCH: 27.8 pg (ref 26.0–34.0)
MCHC: 30.6 g/dL (ref 30.0–36.0)
MCHC: 30.6 g/dL (ref 30.0–36.0)
MCV: 90.4 fL (ref 80.0–100.0)
MCV: 90.8 fL (ref 80.0–100.0)
Platelets: 490 10*3/uL — ABNORMAL HIGH (ref 150–400)
Platelets: 504 10*3/uL — ABNORMAL HIGH (ref 150–400)
RBC: 2.82 MIL/uL — ABNORMAL LOW (ref 3.87–5.11)
RBC: 2.84 MIL/uL — ABNORMAL LOW (ref 3.87–5.11)
RDW: 16.6 % — ABNORMAL HIGH (ref 11.5–15.5)
RDW: 16.7 % — ABNORMAL HIGH (ref 11.5–15.5)
WBC: 12.8 10*3/uL — ABNORMAL HIGH (ref 4.0–10.5)
WBC: 12.9 10*3/uL — ABNORMAL HIGH (ref 4.0–10.5)
nRBC: 0 % (ref 0.0–0.2)
nRBC: 0 % (ref 0.0–0.2)

## 2020-10-08 LAB — CBG MONITORING, ED
Glucose-Capillary: 185 mg/dL — ABNORMAL HIGH (ref 70–99)
Glucose-Capillary: 183 mg/dL — ABNORMAL HIGH (ref 70–99)
Glucose-Capillary: 197 mg/dL — ABNORMAL HIGH (ref 70–99)

## 2020-10-08 LAB — GLUCOSE, CAPILLARY
Glucose-Capillary: 212 mg/dL — ABNORMAL HIGH (ref 70–99)
Glucose-Capillary: 183 mg/dL — ABNORMAL HIGH (ref 70–99)

## 2020-10-08 LAB — MAGNESIUM: Magnesium: 1.4 mg/dL — ABNORMAL LOW (ref 1.7–2.4)

## 2020-10-08 LAB — APTT: aPTT: 28 seconds (ref 24–36)

## 2020-10-08 LAB — HEMOGLOBIN A1C
Hgb A1c MFr Bld: 7.8 % — ABNORMAL HIGH (ref 4.8–5.6)
Mean Plasma Glucose: 177.16 mg/dL

## 2020-10-08 LAB — PROTIME-INR
INR: 1.1 (ref 0.8–1.2)
Prothrombin Time: 14.4 seconds (ref 11.4–15.2)

## 2020-10-08 MED ORDER — LACTATED RINGERS IV SOLN: INTRAVENOUS | Status: DC

## 2020-10-08 MED ORDER — HEPARIN BOLUS VIA INFUSION
4000.0000 [IU] | Freq: Once | INTRAVENOUS | Status: AC
  Administered 2020-10-08: 4000 [IU] via INTRAVENOUS
  Filled 2020-10-08: qty 4000

## 2020-10-08 MED ORDER — HEPARIN (PORCINE) 25000 UT/250ML-% IV SOLN
1700.0000 [IU]/h | INTRAVENOUS | Status: DC
  Administered 2020-10-08: 1200 [IU]/h via INTRAVENOUS
  Administered 2020-10-08: 1450 [IU]/h via INTRAVENOUS
  Filled 2020-10-08 (×5): qty 250

## 2020-10-08 MED ORDER — MAGNESIUM SULFATE 4 GM/100ML IV SOLN
4.0000 g | Freq: Once | INTRAVENOUS | Status: AC
  Administered 2020-10-08: 4 g via INTRAVENOUS
  Filled 2020-10-08: qty 100

## 2020-10-08 MED ORDER — HEPARIN BOLUS VIA INFUSION
2300.0000 [IU] | Freq: Once | INTRAVENOUS | Status: AC
  Administered 2020-10-08: 2300 [IU] via INTRAVENOUS
  Filled 2020-10-08: qty 2300

## 2020-10-08 MED ORDER — SODIUM CHLORIDE 0.9% FLUSH: 10.0000 mL | INTRAVENOUS | Status: DC | PRN

## 2020-10-08 NOTE — Plan of Care (Signed)

## 2020-10-08 NOTE — ED Notes (Signed)
IV team states it will be 2 hours before they are able to attempt another IV. Dr. Myna Hidalgo notified

## 2020-10-08 NOTE — Progress Notes (Signed)
Progress Note    Deborah [name]   XAJ:[id number]  DOB: 05-Sep-1965  DOA: 10/07/2020     0  PCP: Pcp, No  CC: abd pain  Hospital Course: Deborah [name] is a 55 y.o. female with medical history significant for systolic CHF, chronic kidney disease stage IIIb, carcinoid tumor of the duodenum, uncontrolled type 2 diabetes mellitus, gastroparesis, chronic pain, cirrhosis, and paroxysmal atrial fibrillation on Eliquis, now presenting to the emergency department for evaluation of severe abdominal pain, nausea, and vomiting.  Patient reports that she developed abdominal pain over the past day that has become severe and she ran out of her home pain medications.  Last bowel movement was 3 days ago and loose.  She reports chronic diarrhea.  She continues to pass flatus.  She denies fevers or chills.  She denies dysuria or flank pain.  Pain is severe, beginning in the mid abdomen, and radiating towards her chest, similar to pain she has had frequently in the past.  Taking any oral medications including Eliquis for the past 3 or 4 days due to her nausea and vomiting.  Denies melena, hematochezia, or hematemesis.   Upon arrival to the ED, patient is found to be afebrile, saturating well on room air, slightly tachycardic, and with stable blood pressure.  EKG features sinus tachycardia with rate 110 and QTc interval 502 ms.  Radiographs of the chest and abdomen are negative.  Chemistry panel with potassium 2.6, glucose 218, and creatinine 2.01.  CBC with WBC 15,500, hemoglobin 8.6, and platelets 551,000, all of which appear similar to prior values.  Patient was given a liter of saline, IV PPI, IV labetalol, IV Haldol, Dilaudid, and potassium in the ED. She continues to have severe symptoms and is admitted for further treatment and work-up.  Interval History:  Seen in the ER this morning.  Still having ongoing abdominal pain and nausea.  Did not have any inclination for initiating diet when  seen.  ROS: Constitutional: negative for chills and fevers, Respiratory: negative for cough, Cardiovascular: negative for chest pain, and Gastrointestinal: negative for abdominal pain  Assessment & Plan: Intractable abdominal pain and N/V  - Patient with hx of chronic abdomina pain and diabetic gastroparesis p/w 3-4 days N/V and worsening abdominal pain   - No acute findings on plain radiographs, symptoms same as prior episodes of gastroparesis  - She has been unable to tolerate Reglan d/t extrapyramidal sxs and treatment complicated by prolonged QT interval  - Continue supportive care, NPO for now, allow clears when improving and advance as tolerated     AKI superimposed on CKD IIIb  - SCr is 2.01 on admission, up from 1.64 on July 10th  - She likely has acute prerenal azotemia in setting of hypovolemia  - She was given a liter NS in ED, has hx of CHF  - Continue gentle IVF hydration, renally-dose medications, monitor     Uncontrolled IDDM  - A1c was 10.5% in May 2022  - Continue CBG checks and insulin     Paroxysmal atrial fibrillation  - In sinus rhythm on admission  - CHADS-VASc 6 (CVA x2, gender, CHF, DM, HTN) - Has been unable to take Eliquis for a few days d/t N/V, will use IV heparin for now    Chronic systolic CHF  - Appears hypovolemic in setting of  3-4 days N/V - Given a liter of NS in ED  - Continue gentle IVF hydration, monitor volume status    Hypokalemia   -  Serum potassium 2.6 secondary to GI-losses  - Replace, give empiric magnesium, repeat chemistries in am    Prolonged QT interval  - QTc is 502 ms in ED  - Replace potassium, check magnesium, minimize QT-prolonging medications, repeat EKG in am   Hypertension - Unable to tolerate PO on admission, treat as-needed only fror now   Elevated troponin - Troponin slightly elevated and flat - She has abdominal pain radiating to chest similar to her prior episodes of gastroparesis, no acute ischemic features on  EKG, and suspicion for ACS is very low   Anemia - Appears stable with no apparent bleeding   Leukocytosis, thrombocytosis - Appears to be chronic, no apparent infectious process, will culture if febrile   Old records reviewed in assessment of this patient  Antimicrobials:   DVT prophylaxis: Heparin drip   Code Status:   Code Status: Full Code Family Communication:   Disposition Plan: Status is: Observation  The patient remains OBS appropriate and will d/c before 2 midnights.  Dispo: The patient is from: Home              Anticipated d/c is to: Home              Patient currently is not medically stable to d/c.   Difficult to place patient No  Risk of unplanned readmission score:     Objective: Blood pressure (!) 151/115, pulse (!) 103, temperature 97.8 F (36.6 C), temperature source Oral, resp. rate 20, height 5\' 6"  (1.676 m), last menstrual period 10/10/2012, SpO2 100 %.  Examination: General appearance: alert, cooperative, no distress, and morbidly obese Head: Normocephalic, without obvious abnormality, atraumatic Eyes:  EOMI Lungs: clear to auscultation bilaterally Heart: regular rate and rhythm and S1, S2 normal Abdomen:  Obese, soft, bowel sounds present Extremities:  Obese, trace edema Skin: mobility and turgor normal Neurologic: Grossly normal  Consultants:    Procedures:    Data Reviewed: I have personally reviewed following labs and imaging studies Results for orders placed or performed during the hospital encounter of 10/07/20 (from the past 24 hour(s))  Resp Panel by RT-PCR (Flu A&B, Covid) Nasopharyngeal Swab     Status: None   Collection Time: 10/07/20  6:52 PM   Specimen: Nasopharyngeal Swab; Nasopharyngeal(NP) swabs in vial transport medium  Result Value Ref Range   SARS Coronavirus 2 by RT PCR NEGATIVE NEGATIVE   Influenza A by PCR NEGATIVE NEGATIVE   Influenza B by PCR NEGATIVE NEGATIVE  CBG monitoring, ED     Status: Abnormal    Collection Time: 10/07/20 10:18 PM  Result Value Ref Range   Glucose-Capillary 190 (H) 70 - 99 mg/dL  Heparin level (unfractionated)     Status: Abnormal   Collection Time: 10/08/20 12:17 AM  Result Value Ref Range   Heparin Unfractionated <0.10 (L) 0.30 - 0.70 IU/mL  APTT     Status: None   Collection Time: 10/08/20 12:17 AM  Result Value Ref Range   aPTT 28 24 - 36 seconds  Protime-INR     Status: None   Collection Time: 10/08/20 12:17 AM  Result Value Ref Range   Prothrombin Time 14.4 11.4 - 15.2 seconds   INR 1.1 0.8 - 1.2  Comprehensive metabolic panel     Status: Abnormal   Collection Time: 10/08/20  2:40 AM  Result Value Ref Range   Sodium 139 135 - 145 mmol/L   Potassium 4.0 3.5 - 5.1 mmol/L   Chloride 109 98 - 111 mmol/L  CO2 20 (L) 22 - 32 mmol/L   Glucose, Bld 182 (H) 70 - 99 mg/dL   BUN 15 6 - 20 mg/dL   Creatinine, Ser 1.63 (H) 0.44 - 1.00 mg/dL   Calcium 8.1 (L) 8.9 - 10.3 mg/dL   Total Protein 7.9 6.5 - 8.1 g/dL   Albumin 3.1 (L) 3.5 - 5.0 g/dL   AST 22 15 - 41 U/L   ALT 11 0 - 44 U/L   Alkaline Phosphatase 95 38 - 126 U/L   Total Bilirubin 1.2 0.3 - 1.2 mg/dL   GFR, Estimated 37 (L) >60 mL/min   Anion gap 10 5 - 15  Magnesium     Status: Abnormal   Collection Time: 10/08/20  2:40 AM  Result Value Ref Range   Magnesium 1.4 (L) 1.7 - 2.4 mg/dL  CBG monitoring, ED     Status: Abnormal   Collection Time: 10/08/20  4:33 AM  Result Value Ref Range   Glucose-Capillary 185 (H) 70 - 99 mg/dL  CBG monitoring, ED     Status: Abnormal   Collection Time: 10/08/20  7:39 AM  Result Value Ref Range   Glucose-Capillary 183 (H) 70 - 99 mg/dL  CBG monitoring, ED     Status: Abnormal   Collection Time: 10/08/20 12:31 PM  Result Value Ref Range   Glucose-Capillary 197 (H) 70 - 99 mg/dL  CBC     Status: Abnormal   Collection Time: 10/08/20  1:52 PM  Result Value Ref Range   WBC 12.9 (H) 4.0 - 10.5 K/uL   RBC 2.84 (L) 3.87 - 5.11 MIL/uL   Hemoglobin 7.9 (L) 12.0 -  15.0 g/dL   HCT 25.8 (L) 36.0 - 46.0 %   MCV 90.8 80.0 - 100.0 fL   MCH 27.8 26.0 - 34.0 pg   MCHC 30.6 30.0 - 36.0 g/dL   RDW 16.7 (H) 11.5 - 15.5 %   Platelets 504 (H) 150 - 400 K/uL   nRBC 0.0 0.0 - 0.2 %  Heparin level (unfractionated)     Status: Abnormal   Collection Time: 10/08/20  1:52 PM  Result Value Ref Range   Heparin Unfractionated <0.10 (L) 0.30 - 0.70 IU/mL  CBC     Status: Abnormal   Collection Time: 10/08/20  1:52 PM  Result Value Ref Range   WBC 12.8 (H) 4.0 - 10.5 K/uL   RBC 2.82 (L) 3.87 - 5.11 MIL/uL   Hemoglobin 7.8 (L) 12.0 - 15.0 g/dL   HCT 25.5 (L) 36.0 - 46.0 %   MCV 90.4 80.0 - 100.0 fL   MCH 27.7 26.0 - 34.0 pg   MCHC 30.6 30.0 - 36.0 g/dL   RDW 16.6 (H) 11.5 - 15.5 %   Platelets 490 (H) 150 - 400 K/uL   nRBC 0.0 0.0 - 0.2 %  Glucose, capillary     Status: Abnormal   Collection Time: 10/08/20  4:54 PM  Result Value Ref Range   Glucose-Capillary 212 (H) 70 - 99 mg/dL   Comment 1 Notify RN     Recent Results (from the past 240 hour(s))  Resp Panel by RT-PCR (Flu A&B, Covid) Nasopharyngeal Swab     Status: None   Collection Time: 10/07/20  6:52 PM   Specimen: Nasopharyngeal Swab; Nasopharyngeal(NP) swabs in vial transport medium  Result Value Ref Range Status   SARS Coronavirus 2 by RT PCR NEGATIVE NEGATIVE Final    Comment: (NOTE) SARS-CoV-2 target nucleic acids are NOT DETECTED.  The SARS-CoV-2  RNA is generally detectable in upper respiratory specimens during the acute phase of infection. The lowest concentration of SARS-CoV-2 viral copies this assay can detect is 138 copies/mL. A negative result does not preclude SARS-Cov-2 infection and should not be used as the sole basis for treatment or other patient management decisions. A negative result may occur with  improper specimen collection/handling, submission of specimen other than nasopharyngeal swab, presence of viral mutation(s) within the areas targeted by this assay, and inadequate  number of viral copies(<138 copies/mL). A negative result must be combined with clinical observations, patient history, and epidemiological information. The expected result is Negative.  Fact Sheet for Patients:  EntrepreneurPulse.com.au  Fact Sheet for Healthcare Providers:  IncredibleEmployment.be  This test is no t yet approved or cleared by the [place] FDA and  has been authorized for detection and/or diagnosis of SARS-CoV-2 by FDA under an Emergency Use Authorization (EUA). This EUA will remain  in effect (meaning this test can be used) for the duration of the COVID-19 declaration under Section 564(b)(1) of the Act, 21 U.S.C.section 360bbb-3(b)(1), unless the authorization is terminated  or revoked sooner.       Influenza A by PCR NEGATIVE NEGATIVE Final   Influenza B by PCR NEGATIVE NEGATIVE Final    Comment: (NOTE) The Xpert Xpress SARS-CoV-2/FLU/RSV plus assay is intended as an aid in the diagnosis of influenza from Nasopharyngeal swab specimens and should not be used as a sole basis for treatment. Nasal washings and aspirates are unacceptable for Xpert Xpress SARS-CoV-2/FLU/RSV testing.  Fact Sheet for Patients: EntrepreneurPulse.com.au  Fact Sheet for Healthcare Providers: IncredibleEmployment.be  This test is not yet approved or cleared by the [place] FDA and has been authorized for detection and/or diagnosis of SARS-CoV-2 by FDA under an Emergency Use Authorization (EUA). This EUA will remain in effect (meaning this test can be used) for the duration of the COVID-19 declaration under Section 564(b)(1) of the Act, 21 U.S.C. section 360bbb-3(b)(1), unless the authorization is terminated or revoked.  Performed at [facility], [place] [address] [place], [place] [address]      Radiology Studies: DG Abd Acute W/Chest  Result Date: 10/07/2020 CLINICAL DATA:   Abdominal and chest pain for 3 hours. EXAM: DG ABDOMEN ACUTE WITH 1 VIEW CHEST COMPARISON:  Single-view of the chest and CT abdomen and pelvis 09/14/2020. FINDINGS: Single-view of the chest demonstrates clear lungs. Heart size is normal. No pneumothorax or pleural fluid. No acute or focal bony abnormality. Two views of the abdomen show no free intraperitoneal air. The bowel gas pattern is nonobstructive. No acute or focal bony abnormality. IMPRESSION: Negative exam. Electronically Signed   By: Inge Rise M.D.   On: 10/07/2020 14:04   DG Abd Acute W/Chest  Final Result      Scheduled Meds:  insulin aspart  0-15 Units Subcutaneous Q4H   insulin glargine-yfgn  15 Units Subcutaneous BID   PRN Meds: acetaminophen **OR** acetaminophen, HYDROmorphone (DILAUDID) injection, LORazepam, sodium chloride flush Continuous Infusions:  heparin 1,450 Units/hr (10/08/20 1533)   lactated ringers 75 mL/hr at 10/08/20 1042     LOS: 0 days  Time spent: Greater than 50% of the 35 minute visit was spent in counseling/coordination of care for the patient as laid out in the A&P.   Dwyane Dee, MD [facility] 10/08/2020, 5:19 PM

## 2020-10-08 NOTE — ED Notes (Signed)
Attempted to place [place] IV. Unsuccessful. RN made aware and IV team to be consulted.

## 2020-10-08 NOTE — Progress Notes (Signed)
ANTICOAGULATION CONSULT NOTE - Initial Consult  Pharmacy Consult for Heparin Indication: atrial fibrillation  Allergies  Allergen Reactions   Diazepam [name]   Gabapentin [name] and Swelling    Other reaction(s): Unknown   Iodinated Diagnostic Agents Anaphylaxis    11/29/17 Cardiac arrest 1 min after IV contrast, possible allergy vs vasovagal episode Iopamidol  Anaphylaxis  High 11/28/2017  Patient had seizure like activity and then code post 100 cc of isovue 300     Isovue [Iopamidol] Anaphylaxis    11/28/17 Patient had seizure like activity and then 1 min code after 100 cc of isovue 300. Possible contrast allergy vs vasovagal episode   Lisinopril Anaphylaxis    Tongue and mouth swelling   Metoclopramide Other (See Comments)    Tardive dyskinesia   Nsaids Anaphylaxis and Other (See Comments)    ULCER   Penicillins Palpitations    Has patient had a PCN reaction causing immediate rash, facial/tongue/throat swelling, SOB or lightheadedness with hypotension: Yes, heart races Has patient had a PCN reaction causing severe rash involving mucus membranes or skin necrosis: No Has patient had a PCN reaction that required hospitalization: Yes  Has patient had a PCN reaction occurring within the last 10 years: No    Tolmetin Nausea Only and Other (See Comments)    ULCER   Aspirin Other (See Comments)    Irritates stomach ulcer    Cyclobenzaprine     Other reaction(s): Unknown   Rifamycins    Acetaminophen Nausea Only and Other (See Comments)    Irritates stomach ulcer; Abdominal pain   Tramadol Nausea And Vomiting    Patient Measurements:   Heparin Dosing Weight: 79 kg  Vital Signs: Temp: 98.4 F (36.9 C) (08/03 1341) Temp Source: Oral (08/03 1341) BP: 133/67 (08/03 1341) Pulse Rate: 94 (08/03 1341)  Labs: Recent Labs    10/07/20 1240 10/07/20 1442 10/08/20 0017 10/08/20 0240 10/08/20 1352  HGB 8.6*  --   --   --  7.9*  7.8*  HCT 27.8*  --    --   --  25.8*  25.5*  PLT 551*  --   --   --  504*  490*  APTT  --   --  28  --   --   LABPROT  --   --  14.4  --   --   INR  --   --  1.1  --   --   HEPARINUNFRC  --   --  <0.10*  --  <0.10*  CREATININE 2.01*  --   --  1.63*  --   TROPONINIHS 30* 30*  --   --   --      CrCl cannot be calculated (Unknown ideal weight.).   Medical History: Past Medical History:  Diagnosis Date   Acute back pain with sciatica, left    Acute back pain with sciatica, right    AKI (acute kidney injury) ([place])    Anemia, unspecified    Cancer (HCC)    Carcinoid tumor of duodenum    Chronic pain    Chronic systolic CHF (congestive heart failure) (HCC)    Diabetes mellitus    Esophageal reflux    Fibromyalgia    Gastric ulcer    Gastroparesis    Gout    Hyperlipidemia    Hypertension    Lumbosacral stenosis    NICM (nonischemic cardiomyopathy) (HCC)    Obesity    PAF (paroxysmal atrial fibrillation) ([place])  Stroke [facility]) 02/2011   Vitamin B12 deficiency anemia     Medications:  Eliquis 5mg  PO BID PTA- last dose 7/30  Assessment: 55 yo M with PAF on chronic Eliquis which she has not taken since 10/04/20 due to nausea/vomiting.  Currently in NSR AKI with GFR ~29 CBC- Hg 8.6- low but patient's baseline. Pltc 551- elevated at baseline.  Baseline coags: INR, apTT, heparin level at baseline.  10/08/20 2:56 PM  HL is sub-therapeutic  Hgb ~ 8 stable at baseline Infusion paused briefly but started back 8 hours before level No other infusion related issues or bleeding reported per RN  Goal of Therapy:  Heparin level 0.3-0.7 units/ml Monitor platelets by anticoagulation protocol: Yes   Plan:  Bolus heparin 2300 units iv and increase infusion rate to 1450 units/hr Check 8h heparin level Daily heparin level & CBC while on heparin Monitor for s/sx of bleeding F/U for ability to resume Eliquis  Ulice Dash D 10/08/2020,2:47 PM

## 2020-10-08 NOTE — ED Notes (Signed)
Dr. Myna Hidalgo messaged regarding the heparin drip to evaluate if pt can be put on subcut heparin

## 2020-10-08 NOTE — ED Notes (Signed)
Heparin infusion stopped, pt c/o burning at IV site. IV infiltrated. Only 55mL of heparin administered prior to stopping infusion. Dr. Myna Hidalgo notified. IV team will be consulted for midline

## 2020-10-09 DIAGNOSIS — R112 Nausea with vomiting, unspecified: Secondary | ICD-10-CM | POA: Diagnosis not present

## 2020-10-09 LAB — CBC WITH DIFFERENTIAL/PLATELET
Abs Immature Granulocytes: 0.11 10*3/uL — ABNORMAL HIGH (ref 0.00–0.07)
Basophils Absolute: 0.1 10*3/uL (ref 0.0–0.1)
Basophils Relative: 0 %
Eosinophils Absolute: 0.4 10*3/uL (ref 0.0–0.5)
Eosinophils Relative: 3 %
HCT: 24 % — ABNORMAL LOW (ref 36.0–46.0)
Hemoglobin: 7.3 g/dL — ABNORMAL LOW (ref 12.0–15.0)
Immature Granulocytes: 1 %
Lymphocytes Relative: 17 %
Lymphs Abs: 2.6 10*3/uL (ref 0.7–4.0)
MCH: 28 pg (ref 26.0–34.0)
MCHC: 30.4 g/dL (ref 30.0–36.0)
MCV: 92 fL (ref 80.0–100.0)
Monocytes Absolute: 0.8 10*3/uL (ref 0.1–1.0)
Monocytes Relative: 5 %
Neutro Abs: 10.8 10*3/uL — ABNORMAL HIGH (ref 1.7–7.7)
Neutrophils Relative %: 74 %
Platelets: 418 10*3/uL — ABNORMAL HIGH (ref 150–400)
RBC: 2.61 MIL/uL — ABNORMAL LOW (ref 3.87–5.11)
RDW: 16.9 % — ABNORMAL HIGH (ref 11.5–15.5)
WBC: 14.7 10*3/uL — ABNORMAL HIGH (ref 4.0–10.5)
nRBC: 0 % (ref 0.0–0.2)

## 2020-10-09 LAB — BASIC METABOLIC PANEL
Anion gap: 9 (ref 5–15)
BUN: 14 mg/dL (ref 6–20)
CO2: 21 mmol/L — ABNORMAL LOW (ref 22–32)
Calcium: 8.3 mg/dL — ABNORMAL LOW (ref 8.9–10.3)
Chloride: 110 mmol/L (ref 98–111)
Creatinine, Ser: 1.51 mg/dL — ABNORMAL HIGH (ref 0.44–1.00)
GFR, Estimated: 40 mL/min — ABNORMAL LOW (ref >60)
Glucose, Bld: 195 mg/dL — ABNORMAL HIGH (ref 70–99)
Potassium: 3 mmol/L — ABNORMAL LOW (ref 3.5–5.1)
Sodium: 140 mmol/L (ref 135–145)

## 2020-10-09 LAB — MAGNESIUM: Magnesium: 2 mg/dL (ref 1.7–2.4)

## 2020-10-09 LAB — GLUCOSE, CAPILLARY
Glucose-Capillary: 161 mg/dL — ABNORMAL HIGH (ref 70–99)
Glucose-Capillary: 171 mg/dL — ABNORMAL HIGH (ref 70–99)
Glucose-Capillary: 183 mg/dL — ABNORMAL HIGH (ref 70–99)
Glucose-Capillary: 185 mg/dL — ABNORMAL HIGH (ref 70–99)
Glucose-Capillary: 261 mg/dL — ABNORMAL HIGH (ref 70–99)
Glucose-Capillary: 308 mg/dL — ABNORMAL HIGH (ref 70–99)

## 2020-10-09 LAB — HEPARIN LEVEL (UNFRACTIONATED)
Heparin Unfractionated: 0.13 IU/mL — ABNORMAL LOW (ref 0.30–0.70)
Heparin Unfractionated: 0.36 IU/mL (ref 0.30–0.70)

## 2020-10-09 MED ORDER — HYDROMORPHONE HCL 1 MG/ML IJ SOLN
1.0000 mg | INTRAMUSCULAR | Status: DC | PRN
  Administered 2020-10-09 – 2020-10-13 (×29): 1 mg via INTRAVENOUS
  Filled 2020-10-09 (×30): qty 1

## 2020-10-09 MED ORDER — ACETAMINOPHEN 325 MG PO TABS
650.0000 mg | ORAL_TABLET | Freq: Four times a day (QID) | ORAL | Status: DC
  Filled 2020-10-09 (×2): qty 2

## 2020-10-09 MED ORDER — APIXABAN 5 MG PO TABS
5.0000 mg | ORAL_TABLET | Freq: Two times a day (BID) | ORAL | Status: DC
  Administered 2020-10-09 – 2020-10-13 (×8): 5 mg via ORAL
  Filled 2020-10-09 (×8): qty 1

## 2020-10-09 MED ORDER — HEPARIN BOLUS VIA INFUSION
2000.0000 [IU] | Freq: Once | INTRAVENOUS | Status: AC
  Administered 2020-10-09: 2000 [IU] via INTRAVENOUS
  Filled 2020-10-09: qty 2000

## 2020-10-09 MED ORDER — OXYCODONE HCL 5 MG PO TABS
10.0000 mg | ORAL_TABLET | ORAL | Status: DC | PRN
Start: 2020-10-09 — End: 2020-10-09
  Filled 2020-10-09: qty 2

## 2020-10-09 MED ORDER — HYDROMORPHONE HCL 1 MG/ML IJ SOLN: 0.5000 mg | INTRAMUSCULAR | Status: DC | PRN

## 2020-10-09 NOTE — Progress Notes (Signed)
ANTICOAGULATION CONSULT NOTE  Pharmacy Consult for Heparin Indication: atrial fibrillation  Allergies  Allergen Reactions   Diazepam [name]   Gabapentin [name] and Swelling    Other reaction(s): Unknown   Iodinated Diagnostic Agents Anaphylaxis    11/29/17 Cardiac arrest 1 min after IV contrast, possible allergy vs vasovagal episode Iopamidol  Anaphylaxis  High 11/28/2017  Patient had seizure like activity and then code post 100 cc of isovue 300     Isovue [Iopamidol] Anaphylaxis    11/28/17 Patient had seizure like activity and then 1 min code after 100 cc of isovue 300. Possible contrast allergy vs vasovagal episode   Lisinopril Anaphylaxis    Tongue and mouth swelling   Metoclopramide Other (See Comments)    Tardive dyskinesia   Nsaids Anaphylaxis and Other (See Comments)    ULCER   Penicillins Palpitations    Has patient had a PCN reaction causing immediate rash, facial/tongue/throat swelling, SOB or lightheadedness with hypotension: Yes, heart races Has patient had a PCN reaction causing severe rash involving mucus membranes or skin necrosis: No Has patient had a PCN reaction that required hospitalization: Yes  Has patient had a PCN reaction occurring within the last 10 years: No    Tolmetin Nausea Only and Other (See Comments)    ULCER   Aspirin Other (See Comments)    Irritates stomach ulcer    Cyclobenzaprine     Other reaction(s): Unknown   Rifamycins    Acetaminophen Nausea Only and Other (See Comments)    Irritates stomach ulcer; Abdominal pain   Tramadol Nausea And Vomiting    Patient Measurements: Height: 5\' 6"  (167.6 cm) IBW/kg (Calculated) : 59.3 Heparin Dosing Weight: 79 kg  Vital Signs: Temp: 98.7 F (37.1 C) (08/03 2316) Temp Source: Oral (08/03 2316) BP: 98/79 (08/03 2316) Pulse Rate: 100 (08/03 2316)  Labs: Recent Labs    10/07/20 1240 10/07/20 1442 10/08/20 0017 10/08/20 0240 10/08/20 1352 10/09/20 0056  HGB  8.6*  --   --   --  7.9*  7.8*  --   HCT 27.8*  --   --   --  25.8*  25.5*  --   PLT 551*  --   --   --  504*  490*  --   APTT  --   --  28  --   --   --   LABPROT  --   --  14.4  --   --   --   INR  --   --  1.1  --   --   --   HEPARINUNFRC  --   --  <0.10*  --  <0.10* 0.13*  CREATININE 2.01*  --   --  1.63*  --   --   TROPONINIHS 30* 30*  --   --   --   --      CrCl cannot be calculated (Unknown ideal weight.).   Medical History: Past Medical History:  Diagnosis Date   Acute back pain with sciatica, left    Acute back pain with sciatica, right    AKI (acute kidney injury) ([place])    Anemia, unspecified    Cancer (HCC)    Carcinoid tumor of duodenum    Chronic pain    Chronic systolic CHF (congestive heart failure) (HCC)    Diabetes mellitus    Esophageal reflux    Fibromyalgia    Gastric ulcer    Gastroparesis    Gout  Hyperlipidemia    Hypertension    Lumbosacral stenosis    NICM (nonischemic cardiomyopathy) (HCC)    Obesity    PAF (paroxysmal atrial fibrillation) ([place])    Stroke ([place]) 02/2011   Vitamin B12 deficiency anemia     Medications:  Eliquis 5mg  PO BID PTA- last dose 7/30  Assessment: 55 yo M with PAF on chronic Eliquis which she has not taken since 10/04/20 due to nausea/vomiting.  Currently in NSR AKI with GFR ~29 CBC- Hg 8.6- low but patient's baseline. Pltc 551- elevated at baseline.  Baseline coags: INR, apTT, heparin level at baseline.  10/09/20 1:23 AM  HL 0.13- sub-therapeutic on heparin infusion at 1450 units/hr Hgb ~ 8 stable at baseline No bleeding reported per RN Heparin infusion was off ~1h prior to heparin bag exchange at 2143  Goal of Therapy:  Heparin level 0.3-0.7 units/ml Monitor platelets by anticoagulation protocol: Yes   Plan:  Bolus heparin 2000 units IV and increase infusion rate to 1700 units/hr Check 8h heparin level Daily heparin level & CBC while on heparin Monitor for s/sx of bleeding F/U for ability to  resume Eliquis  Netta Cedars PharmD 10/09/2020,1:23 AM

## 2020-10-09 NOTE — Progress Notes (Signed)
[place] for Heparin >> apixaban  Indication: atrial fibrillation  Allergies  Allergen Reactions   Diazepam [name]   Gabapentin [name] and Swelling    Other reaction(s): Unknown   Iodinated Diagnostic Agents Anaphylaxis    11/29/17 Cardiac arrest 1 min after IV contrast, possible allergy vs vasovagal episode Iopamidol  Anaphylaxis  High 11/28/2017  Patient had seizure like activity and then code post 100 cc of isovue 300     Isovue [Iopamidol] Anaphylaxis    11/28/17 Patient had seizure like activity and then 1 min code after 100 cc of isovue 300. Possible contrast allergy vs vasovagal episode   Lisinopril Anaphylaxis    Tongue and mouth swelling   Metoclopramide Other (See Comments)    Tardive dyskinesia   Nsaids Anaphylaxis and Other (See Comments)    ULCER   Penicillins Palpitations    Has patient had a PCN reaction causing immediate rash, facial/tongue/throat swelling, SOB or lightheadedness with hypotension: Yes, heart races Has patient had a PCN reaction causing severe rash involving mucus membranes or skin necrosis: No Has patient had a PCN reaction that required hospitalization: Yes  Has patient had a PCN reaction occurring within the last 10 years: No    Tolmetin Nausea Only and Other (See Comments)    ULCER   Aspirin Other (See Comments)    Irritates stomach ulcer    Cyclobenzaprine     Other reaction(s): Unknown   Rifamycins    Acetaminophen Nausea Only and Other (See Comments)    Irritates stomach ulcer; Abdominal pain   Tramadol Nausea And Vomiting    Patient Measurements: Height: 5\' 6"  (167.6 cm) Weight: 135.6 kg (298 lb 15.1 oz) IBW/kg (Calculated) : 59.3 Heparin Dosing Weight: 79 kg  Vital Signs: Temp: 98.9 F (37.2 C) (08/04 0435) Temp Source: Oral (08/04 0435) BP: 149/73 (08/04 0435) Pulse Rate: 109 (08/04 0435)  Labs: Recent Labs    10/07/20 1240 10/07/20 1442 10/08/20 0017  10/08/20 0240 10/08/20 1352 10/09/20 0056 10/09/20 0825  HGB 8.6*  --   --   --  7.9*  7.8*  --  7.3*  HCT 27.8*  --   --   --  25.8*  25.5*  --  24.0*  PLT 551*  --   --   --  504*  490*  --  418*  APTT  --   --  28  --   --   --   --   LABPROT  --   --  14.4  --   --   --   --   INR  --   --  1.1  --   --   --   --   HEPARINUNFRC  --   --  <0.10*  --  <0.10* 0.13*  --   CREATININE 2.01*  --   --  1.63*  --   --  1.51*  TROPONINIHS 30* 30*  --   --   --   --   --      Estimated Creatinine Clearance: 59 mL/min (A) (by C-G formula based on SCr of 1.51 mg/dL (H)).   Medical History: Past Medical History:  Diagnosis Date   Acute back pain with sciatica, left    Acute back pain with sciatica, right    AKI (acute kidney injury) ([place])    Anemia, unspecified    Cancer (HCC)    Carcinoid tumor of duodenum    Chronic  pain    Chronic systolic CHF (congestive heart failure) (HCC)    Diabetes mellitus    Esophageal reflux    Fibromyalgia    Gastric ulcer    Gastroparesis    Gout    Hyperlipidemia    Hypertension    Lumbosacral stenosis    NICM (nonischemic cardiomyopathy) (HCC)    Obesity    PAF (paroxysmal atrial fibrillation) ([place])    Stroke ([place]) 02/2011   Vitamin B12 deficiency anemia     Medications:  Eliquis 5mg  PO BID PTA- last dose 7/30  Assessment: 55 yo M with PAF on chronic Eliquis which she has not taken since 10/04/20 due to nausea/vomiting.  Currently in NSR    10/09/20 11:06 AM  Resuming apixaban this AM  Scr improving, down to 1.51 this AM  Hgb ~ 7.3 stable at baseline No bleeding reported per RN Heparin infusion was off ~1h prior to heparin bag exchange at 2143  Goal of Therapy:  Heparin level 0.3-0.7 units/ml Monitor platelets by anticoagulation protocol: Yes   Plan:  Stop heparin and resume Eliquis 5 mg PO BID  Monitor Scr, CBC  Monitor for s/sx of bleeding   Royetta Asal, PharmD, BCPS 10/09/2020 11:06 AM

## 2020-10-09 NOTE — Hospital Course (Signed)
Deborah [name] is a 55 y.o. female with medical history significant for systolic CHF, chronic kidney disease stage IIIb, carcinoid tumor of the duodenum, uncontrolled type 2 diabetes mellitus, gastroparesis, chronic pain, cirrhosis, and paroxysmal atrial fibrillation on Eliquis, now presenting to the emergency department for evaluation of severe abdominal pain, nausea, and vomiting.  Patient reports that she developed abdominal pain over the past day that has become severe and she ran out of her home pain medications.  Last bowel movement was 3 days ago and loose.  She reports chronic diarrhea.  She continues to pass flatus.  She denies fevers or chills.  She denies dysuria or flank pain.  Pain is severe, beginning in the mid abdomen, and radiating towards her chest, similar to pain she has had frequently in the past.  Taking any oral medications including Eliquis for the past 3 or 4 days due to her nausea and vomiting.  Denies melena, hematochezia, or hematemesis.   Upon arrival to the ED, patient is found to be afebrile, saturating well on room air, slightly tachycardic, and with stable blood pressure.  EKG features sinus tachycardia with rate 110 and QTc interval 502 ms.  Radiographs of the chest and abdomen are negative.  Chemistry panel with potassium 2.6, glucose 218, and creatinine 2.01.  CBC with WBC 15,500, hemoglobin 8.6, and platelets 551,000, all of which appear similar to prior values.  Patient was given a liter of saline, IV PPI, IV labetalol, IV Haldol, Dilaudid, and potassium in the ED. She continues to have severe symptoms and is admitted for further treatment and work-up.

## 2020-10-09 NOTE — Progress Notes (Signed)
Progress Note    Deborah [name]   FXT:[id number]  DOB: 14-Aug-1965  DOA: 10/07/2020     1  PCP: Pcp, No  CC: abd pain  Hospital Course: Deborah [name] is a 55 y.o. female with medical history significant for systolic CHF, chronic kidney disease stage IIIb, carcinoid tumor of the duodenum, uncontrolled type 2 diabetes mellitus, gastroparesis, chronic pain, cirrhosis, and paroxysmal atrial fibrillation on Eliquis, now presenting to the emergency department for evaluation of severe abdominal pain, nausea, and vomiting.  Patient reports that she developed abdominal pain over the past day that has become severe and she ran out of her home pain medications.  Last bowel movement was 3 days ago and loose.  She reports chronic diarrhea.  She continues to pass flatus.  She denies fevers or chills.  She denies dysuria or flank pain.  Pain is severe, beginning in the mid abdomen, and radiating towards her chest, similar to pain she has had frequently in the past.  Taking any oral medications including Eliquis for the past 3 or 4 days due to her nausea and vomiting.  Denies melena, hematochezia, or hematemesis.   Upon arrival to the ED, patient is found to be afebrile, saturating well on room air, slightly tachycardic, and with stable blood pressure.  EKG features sinus tachycardia with rate 110 and QTc interval 502 ms.  Radiographs of the chest and abdomen are negative.  Chemistry panel with potassium 2.6, glucose 218, and creatinine 2.01.  CBC with WBC 15,500, hemoglobin 8.6, and platelets 551,000, all of which appear similar to prior values.  Patient was given a liter of saline, IV PPI, IV labetalol, IV Haldol, Dilaudid, and potassium in the ED. She continues to have severe symptoms and is admitted for further treatment and work-up.  Interval History:  No events overnight.  Had some improvement yesterday and was started on a clear liquid diet which she tolerated some.  This morning she is wishing  to advance diet to soft diet.  ROS: Constitutional: negative for chills and fevers, Respiratory: negative for cough, Cardiovascular: negative for chest pain, and Gastrointestinal: negative for abdominal pain  Assessment & Plan: Intractable abdominal pain and N/V  - Patient with hx of chronic abdominal pain and diabetic gastroparesis p/w 3-4 days N/V and worsening abdominal pain   - No acute findings on plain radiographs, symptoms same as prior episodes of gastroparesis  - She has been unable to tolerate Reglan d/t extrapyramidal sxs and treatment complicated by prolonged QT interval  -Tolerated clear liquids yesterday evening.  Advance further today to soft diet   AKI superimposed on CKD IIIb  - SCr is 2.01 on admission, up from 1.64 on July 10th  - She likely has acute prerenal azotemia in setting of hypovolemia  - She was given a liter NS in ED, has hx of CHF  -Discontinue fluids   Uncontrolled IDDM  - A1c was 10.5% in May 2022  - Continue CBG checks and insulin     Paroxysmal atrial fibrillation  - In sinus rhythm on admission  - CHADS-VASc 6 (CVA x2, gender, CHF, DM, HTN) -Now that tolerating some oral intake, discontinue heparin drip and resume Eliquis   Chronic systolic CHF  - Appears hypovolemic in setting of  3-4 days N/V -S/p fluids on admission   Hypokalemia   - Serum potassium 2.6 secondary to GI-losses  - Replace, give empiric magnesium, repeat chemistries in am    Prolonged QT interval  - QTc is  502 ms in ED  - Replace potassium, check magnesium, minimize QT-prolonging medications, repeat EKG in am   Hypertension - stable   Elevated troponin - Troponin slightly elevated and flat - She has abdominal pain radiating to chest similar to her prior episodes of gastroparesis, no acute ischemic features on EKG, and suspicion for ACS is very low   Anemia - Appears stable with no apparent bleeding   Leukocytosis Thrombocytosis - Appears to be chronic, no apparent  infectious process, will culture if febrile   Old records reviewed in assessment of this patient  Antimicrobials:   DVT prophylaxis: Heparin drip - d/c on 8/4 apixaban (ELIQUIS) tablet 5 mg   Code Status:   Code Status: Full Code Family Communication:   Disposition Plan: Status is: Inpatient  Remains inpatient appropriate because:IV treatments appropriate due to intensity of illness or inability to take PO and Inpatient level of care appropriate due to severity of illness  Dispo: The patient is from: Home              Anticipated d/c is to: Home              Patient currently is not medically stable to d/c.   Difficult to place patient No  Risk of unplanned readmission score: Unplanned Admission- Pilot do not use: 31.92   Objective: Blood pressure 135/87, pulse (!) 104, temperature 98.4 F (36.9 C), temperature source Oral, resp. rate 16, height 5\' 6"  (1.676 m), weight 135.6 kg, last menstrual period 10/10/2012, SpO2 100 %.  Examination: General appearance: alert, cooperative, no distress, and morbidly obese Head: Normocephalic, without obvious abnormality, atraumatic Eyes:  EOMI Lungs: clear to auscultation bilaterally Heart: regular rate and rhythm and S1, S2 normal Abdomen:  Obese, soft, bowel sounds present Extremities:  Obese, trace edema Skin: mobility and turgor normal Neurologic: Grossly normal  Consultants:    Procedures:    Data Reviewed: I have personally reviewed following labs and imaging studies Results for orders placed or performed during the hospital encounter of 10/07/20 (from the past 24 hour(s))  Glucose, capillary     Status: Abnormal   Collection Time: 10/08/20  4:54 PM  Result Value Ref Range   Glucose-Capillary 212 (H) 70 - 99 mg/dL   Comment 1 Notify RN   Glucose, capillary     Status: Abnormal   Collection Time: 10/08/20  9:20 PM  Result Value Ref Range   Glucose-Capillary 183 (H) 70 - 99 mg/dL   Comment 1 Notify RN   Glucose,  capillary     Status: Abnormal   Collection Time: 10/09/20 12:04 AM  Result Value Ref Range   Glucose-Capillary 183 (H) 70 - 99 mg/dL   Comment 1 Notify RN    Comment 2 Document in Chart   Heparin level (unfractionated)     Status: Abnormal   Collection Time: 10/09/20 12:56 AM  Result Value Ref Range   Heparin Unfractionated 0.13 (L) 0.30 - 0.70 IU/mL  Glucose, capillary     Status: Abnormal   Collection Time: 10/09/20  4:06 AM  Result Value Ref Range   Glucose-Capillary 171 (H) 70 - 99 mg/dL   Comment 1 Notify RN   Glucose, capillary     Status: Abnormal   Collection Time: 10/09/20  7:49 AM  Result Value Ref Range   Glucose-Capillary 185 (H) 70 - 99 mg/dL   Comment 1 Notify RN    Comment 2 Document in Chart   Basic metabolic panel  Status: Abnormal   Collection Time: 10/09/20  8:25 AM  Result Value Ref Range   Sodium 140 135 - 145 mmol/L   Potassium 3.0 (L) 3.5 - 5.1 mmol/L   Chloride 110 98 - 111 mmol/L   CO2 21 (L) 22 - 32 mmol/L   Glucose, Bld 195 (H) 70 - 99 mg/dL   BUN 14 6 - 20 mg/dL   Creatinine, Ser 1.51 (H) 0.44 - 1.00 mg/dL   Calcium 8.3 (L) 8.9 - 10.3 mg/dL   GFR, Estimated 40 (L) >60 mL/min   Anion gap 9 5 - 15  CBC with Differential/Platelet     Status: Abnormal   Collection Time: 10/09/20  8:25 AM  Result Value Ref Range   WBC 14.7 (H) 4.0 - 10.5 K/uL   RBC 2.61 (L) 3.87 - 5.11 MIL/uL   Hemoglobin 7.3 (L) 12.0 - 15.0 g/dL   HCT 24.0 (L) 36.0 - 46.0 %   MCV 92.0 80.0 - 100.0 fL   MCH 28.0 26.0 - 34.0 pg   MCHC 30.4 30.0 - 36.0 g/dL   RDW 16.9 (H) 11.5 - 15.5 %   Platelets 418 (H) 150 - 400 K/uL   nRBC 0.0 0.0 - 0.2 %   Neutrophils Relative % 74 %   Neutro Abs 10.8 (H) 1.7 - 7.7 K/uL   Lymphocytes Relative 17 %   Lymphs Abs 2.6 0.7 - 4.0 K/uL   Monocytes Relative 5 %   Monocytes Absolute 0.8 0.1 - 1.0 K/uL   Eosinophils Relative 3 %   Eosinophils Absolute 0.4 0.0 - 0.5 K/uL   Basophils Relative 0 %   Basophils Absolute 0.1 0.0 - 0.1 K/uL    Immature Granulocytes 1 %   Abs Immature Granulocytes 0.11 (H) 0.00 - 0.07 K/uL  Magnesium     Status: None   Collection Time: 10/09/20  8:25 AM  Result Value Ref Range   Magnesium 2.0 1.7 - 2.4 mg/dL  Heparin level (unfractionated)     Status: None   Collection Time: 10/09/20 10:36 AM  Result Value Ref Range   Heparin Unfractionated 0.36 0.30 - 0.70 IU/mL  Glucose, capillary     Status: Abnormal   Collection Time: 10/09/20 11:40 AM  Result Value Ref Range   Glucose-Capillary 161 (H) 70 - 99 mg/dL   Comment 1 Notify RN    Comment 2 Document in Chart     Recent Results (from the past 240 hour(s))  Resp Panel by RT-PCR (Flu A&B, Covid) Nasopharyngeal Swab     Status: None   Collection Time: 10/07/20  6:52 PM   Specimen: Nasopharyngeal Swab; Nasopharyngeal(NP) swabs in vial transport medium  Result Value Ref Range Status   SARS Coronavirus 2 by RT PCR NEGATIVE NEGATIVE Final    Comment: (NOTE) SARS-CoV-2 target nucleic acids are NOT DETECTED.  The SARS-CoV-2 RNA is generally detectable in upper respiratory specimens during the acute phase of infection. The lowest concentration of SARS-CoV-2 viral copies this assay can detect is 138 copies/mL. A negative result does not preclude SARS-Cov-2 infection and should not be used as the sole basis for treatment or other patient management decisions. A negative result may occur with  improper specimen collection/handling, submission of specimen other than nasopharyngeal swab, presence of viral mutation(s) within the areas targeted by this assay, and inadequate number of viral copies(<138 copies/mL). A negative result must be combined with clinical observations, patient history, and epidemiological information. The expected result is Negative.  Fact Sheet for Patients:  EntrepreneurPulse.com.au  Fact Sheet for Healthcare Providers:  IncredibleEmployment.be  This test is no t yet approved or cleared by  the [place] FDA and  has been authorized for detection and/or diagnosis of SARS-CoV-2 by FDA under an Emergency Use Authorization (EUA). This EUA will remain  in effect (meaning this test can be used) for the duration of the COVID-19 declaration under Section 564(b)(1) of the Act, 21 U.S.C.section 360bbb-3(b)(1), unless the authorization is terminated  or revoked sooner.       Influenza A by PCR NEGATIVE NEGATIVE Final   Influenza B by PCR NEGATIVE NEGATIVE Final    Comment: (NOTE) The Xpert Xpress SARS-CoV-2/FLU/RSV plus assay is intended as an aid in the diagnosis of influenza from Nasopharyngeal swab specimens and should not be used as a sole basis for treatment. Nasal washings and aspirates are unacceptable for Xpert Xpress SARS-CoV-2/FLU/RSV testing.  Fact Sheet for Patients: EntrepreneurPulse.com.au  Fact Sheet for Healthcare Providers: IncredibleEmployment.be  This test is not yet approved or cleared by the [place] FDA and has been authorized for detection and/or diagnosis of SARS-CoV-2 by FDA under an Emergency Use Authorization (EUA). This EUA will remain in effect (meaning this test can be used) for the duration of the COVID-19 declaration under Section 564(b)(1) of the Act, 21 U.S.C. section 360bbb-3(b)(1), unless the authorization is terminated or revoked.  Performed at [facility], [place] [address], [place], [place] [address]      Radiology Studies: No results found. DG Abd Acute W/Chest  Final Result      Scheduled Meds:  apixaban  5 mg Oral BID   insulin aspart  0-15 Units Subcutaneous Q4H   insulin glargine-yfgn  15 Units Subcutaneous BID   PRN Meds: acetaminophen **OR** acetaminophen, HYDROmorphone (DILAUDID) injection, LORazepam, sodium chloride flush Continuous Infusions:     LOS: 1 day  Time spent: Greater than 50% of the 35 minute visit was spent in counseling/coordination of  care for the patient as laid out in the A&P.   Dwyane Dee, MD [facility] 10/09/2020, 1:58 PM

## 2020-10-10 DIAGNOSIS — R112 Nausea with vomiting, unspecified: Secondary | ICD-10-CM | POA: Diagnosis not present

## 2020-10-10 DIAGNOSIS — D649 Anemia, unspecified: Secondary | ICD-10-CM | POA: Diagnosis not present

## 2020-10-10 LAB — CBC WITH DIFFERENTIAL/PLATELET
Abs Immature Granulocytes: 0.1 10*3/uL — ABNORMAL HIGH (ref 0.00–0.07)
Basophils Absolute: 0.1 10*3/uL (ref 0.0–0.1)
Basophils Relative: 0 %
Eosinophils Absolute: 0.4 10*3/uL (ref 0.0–0.5)
Eosinophils Relative: 3 %
HCT: 23.5 % — ABNORMAL LOW (ref 36.0–46.0)
Hemoglobin: 7 g/dL — ABNORMAL LOW (ref 12.0–15.0)
Immature Granulocytes: 1 %
Lymphocytes Relative: 20 %
Lymphs Abs: 2.5 10*3/uL (ref 0.7–4.0)
MCH: 28.3 pg (ref 26.0–34.0)
MCHC: 29.8 g/dL — ABNORMAL LOW (ref 30.0–36.0)
MCV: 95.1 fL (ref 80.0–100.0)
Monocytes Absolute: 0.9 10*3/uL (ref 0.1–1.0)
Monocytes Relative: 8 %
Neutro Abs: 8.7 10*3/uL — ABNORMAL HIGH (ref 1.7–7.7)
Neutrophils Relative %: 68 %
Platelets: 376 10*3/uL (ref 150–400)
RBC: 2.47 MIL/uL — ABNORMAL LOW (ref 3.87–5.11)
RDW: 17.2 % — ABNORMAL HIGH (ref 11.5–15.5)
WBC: 12.6 10*3/uL — ABNORMAL HIGH (ref 4.0–10.5)
nRBC: 0 % (ref 0.0–0.2)

## 2020-10-10 LAB — BASIC METABOLIC PANEL
Anion gap: 9 (ref 5–15)
BUN: 17 mg/dL (ref 6–20)
CO2: 21 mmol/L — ABNORMAL LOW (ref 22–32)
Calcium: 8.5 mg/dL — ABNORMAL LOW (ref 8.9–10.3)
Chloride: 108 mmol/L (ref 98–111)
Creatinine, Ser: 2.16 mg/dL — ABNORMAL HIGH (ref 0.44–1.00)
GFR, Estimated: 26 mL/min — ABNORMAL LOW (ref >60)
Glucose, Bld: 260 mg/dL — ABNORMAL HIGH (ref 70–99)
Potassium: 3.1 mmol/L — ABNORMAL LOW (ref 3.5–5.1)
Sodium: 138 mmol/L (ref 135–145)

## 2020-10-10 LAB — GLUCOSE, CAPILLARY
Glucose-Capillary: 176 mg/dL — ABNORMAL HIGH (ref 70–99)
Glucose-Capillary: 194 mg/dL — ABNORMAL HIGH (ref 70–99)
Glucose-Capillary: 210 mg/dL — ABNORMAL HIGH (ref 70–99)
Glucose-Capillary: 265 mg/dL — ABNORMAL HIGH (ref 70–99)
Glucose-Capillary: 266 mg/dL — ABNORMAL HIGH (ref 70–99)
Glucose-Capillary: 293 mg/dL — ABNORMAL HIGH (ref 70–99)
Glucose-Capillary: 294 mg/dL — ABNORMAL HIGH (ref 70–99)

## 2020-10-10 LAB — PREPARE RBC (CROSSMATCH)

## 2020-10-10 LAB — MAGNESIUM: Magnesium: 1.9 mg/dL (ref 1.7–2.4)

## 2020-10-10 MED ORDER — MAGNESIUM SULFATE 2 GM/50ML IV SOLN
2.0000 g | Freq: Once | INTRAVENOUS | Status: AC
  Administered 2020-10-10: 2 g via INTRAVENOUS
  Filled 2020-10-10: qty 50

## 2020-10-10 MED ORDER — SENNOSIDES-DOCUSATE SODIUM 8.6-50 MG PO TABS
1.0000 | ORAL_TABLET | Freq: Two times a day (BID) | ORAL | Status: DC
  Administered 2020-10-10 – 2020-10-13 (×4): 1 via ORAL
  Filled 2020-10-10 (×5): qty 1

## 2020-10-10 MED ORDER — ADULT MULTIVITAMIN W/MINERALS CH
1.0000 | ORAL_TABLET | Freq: Every day | ORAL | Status: DC
  Filled 2020-10-10 (×2): qty 1

## 2020-10-10 MED ORDER — COVID-19 MRNA VAC-TRIS(PFIZER) 30 MCG/0.3ML IM SUSP
0.3000 mL | Freq: Once | INTRAMUSCULAR | Status: DC
  Filled 2020-10-10: qty 0.3

## 2020-10-10 MED ORDER — PANTOPRAZOLE SODIUM 40 MG IV SOLR
40.0000 mg | Freq: Every day | INTRAVENOUS | Status: DC
  Administered 2020-10-10 – 2020-10-12 (×3): 40 mg via INTRAVENOUS
  Filled 2020-10-10 (×4): qty 40

## 2020-10-10 MED ORDER — PROSOURCE PLUS PO LIQD
30.0000 mL | Freq: Three times a day (TID) | ORAL | Status: DC
  Administered 2020-10-11: 30 mL via ORAL
  Filled 2020-10-10 (×3): qty 30

## 2020-10-10 MED ORDER — COVID-19 MRNA VAC-TRIS(PFIZER) 30 MCG/0.3ML IM SUSP
0.3000 mL | Freq: Once | INTRAMUSCULAR | Status: AC
  Filled 2020-10-10: qty 0.3

## 2020-10-10 MED ORDER — COVID-19 MRNA VAC-TRIS(PFIZER) 30 MCG/0.3ML IM SUSP
0.3000 mL | Freq: Once | INTRAMUSCULAR | Status: AC
  Administered 2020-10-11: 0.3 mL via INTRAMUSCULAR
  Filled 2020-10-10: qty 0.3

## 2020-10-10 MED ORDER — LACTULOSE 10 GM/15ML PO SOLN
20.0000 g | Freq: Two times a day (BID) | ORAL | Status: DC
  Administered 2020-10-10 – 2020-10-11 (×2): 20 g via ORAL
  Filled 2020-10-10 (×6): qty 30

## 2020-10-10 MED ORDER — SODIUM CHLORIDE 0.9% IV SOLUTION: Freq: Once | INTRAVENOUS | Status: AC

## 2020-10-10 MED ORDER — POTASSIUM CHLORIDE 10 MEQ/100ML IV SOLN
10.0000 meq | INTRAVENOUS | Status: AC
  Administered 2020-10-10 (×4): 10 meq via INTRAVENOUS
  Filled 2020-10-10 (×4): qty 100

## 2020-10-10 NOTE — Progress Notes (Signed)
Called Blood Bank to check status of PRBCs, unit is not ready at this time.

## 2020-10-10 NOTE — Progress Notes (Signed)
Inpatient Diabetes Program Recommendations  AACE/ADA: New Consensus Statement on Inpatient Glycemic Control (2015)  Target Ranges:  Prepandial:   less than 140 mg/dL      Peak postprandial:   less than 180 mg/dL (1-2 hours)      Critically ill patients:  140 - 180 mg/dL   Lab Results  Component Value Date   GLUCAP 294 (H) 10/10/2020   HGBA1C 7.8 (H) 10/08/2020    Review of Glycemic Control Results for ORTENCIA, ASKARI (MRN [id number]) as of 10/10/2020 12:29  Ref. Range 10/09/2020 07:49 10/09/2020 11:40 10/09/2020 16:09 10/09/2020 20:01 10/10/2020 00:00 10/10/2020 04:02 10/10/2020 07:51 10/10/2020 11:59  Glucose-Capillary Latest Ref Range: 70 - 99 mg/dL 185 (H) 161 (H) 261 (H) 308 (H) 194 (H) 265 (H) 176 (H) 294 (H)   Diabetes history: DM 2 Outpatient Diabetes medications: Lantus 50 units qhs, Novolog 30 units 4x/day Current orders for Inpatient glycemic control:  Semglee 15 units bid Novolog 0-15 units Q4 hours  Inpatient Diabetes Program Recommendations:    Glucose trends increase after meal intake.  - Add Novolog 4 units tid meal coverage if eating >50% of meals  Thanks,  Tama Headings RN, MSN, BC-ADM Inpatient Diabetes Coordinator Team Pager [phone] (8a-5p)

## 2020-10-10 NOTE — Progress Notes (Signed)
Progress Note    Deborah [name]   ION:[id number]  DOB: Sep 12, 1965  DOA: 10/07/2020     2  PCP: Pcp, No  CC: abd pain  Hospital Course: Deborah [name] is a 55 y.o. female with medical history significant for systolic CHF, chronic kidney disease stage IIIb, carcinoid tumor of the duodenum, uncontrolled type 2 diabetes mellitus, gastroparesis, chronic pain, cirrhosis, and paroxysmal atrial fibrillation on Eliquis, now presenting to the emergency department for evaluation of severe abdominal pain, nausea, and vomiting.  Patient reports that she developed abdominal pain over the past day that has become severe and she ran out of her home pain medications.  Last bowel movement was 3 days ago and loose.  She reports chronic diarrhea.  She continues to pass flatus.  She denies fevers or chills.  She denies dysuria or flank pain.  Pain is severe, beginning in the mid abdomen, and radiating towards her chest, similar to pain she has had frequently in the past.  Taking any oral medications including Eliquis for the past 3 or 4 days due to her nausea and vomiting.  Denies melena, hematochezia, or hematemesis.   Upon arrival to the ED, patient is found to be afebrile, saturating well on room air, slightly tachycardic, and with stable blood pressure.  EKG features sinus tachycardia with rate 110 and QTc interval 502 ms.  Radiographs of the chest and abdomen are negative.  Chemistry panel with potassium 2.6, glucose 218, and creatinine 2.01.  CBC with WBC 15,500, hemoglobin 8.6, and platelets 551,000, all of which appear similar to prior values.  Patient was given a liter of saline, IV PPI, IV labetalol, IV Haldol, Dilaudid, and potassium in the ED. She continues to have severe symptoms and is admitted for further treatment and work-up.  Interval History:  No events overnight. Tolerating diet still.  Hgb down to 7 this am and she's feeling very lethargic and run down. She is agreeable to 1 unit of  blood today. Also is asking for something to help with constipation.   ROS: Constitutional: negative for chills and fevers, Respiratory: negative for cough, Cardiovascular: negative for chest pain, and Gastrointestinal: negative for abdominal pain  Assessment & Plan: Intractable abdominal pain and N/V  - Patient with hx of chronic abdominal pain and diabetic gastroparesis p/w 3-4 days N/V and worsening abdominal pain   - No acute findings on plain radiographs, symptoms same as prior episodes of gastroparesis  - She has been unable to tolerate Reglan d/t extrapyramidal sxs and treatment complicated by prolonged QT interval  -Continue soft diet   AKI superimposed on CKD IIIb  - SCr is 2.01 on admission, up from 1.64 on July 10th  - She likely has acute prerenal azotemia in setting of hypovolemia  - She was given a liter NS in ED, has hx of CHF  -Discontinue fluids  Normocytic anemia -Likely contributed to from underlying CKD - Hemoglobin has down trended to 7 g/dL this morning.  No overt signs of bleeding - Transfuse 1 unit PRBC and repeat hemoglobin tomorrow   Uncontrolled IDDM  - A1c was 10.5% in May 2022  - Continue CBG checks and insulin     Paroxysmal atrial fibrillation  - In sinus rhythm on admission  - CHADS-VASc 6 (CVA x2, gender, CHF, DM, HTN) -Now that tolerating some oral intake, discontinue heparin drip and resume Eliquis   Chronic systolic CHF  - Appears hypovolemic in setting of  3-4 days N/V -S/p fluids on  admission   Hypokalemia   - Serum potassium 2.6 secondary to GI-losses  - Replace, give empiric magnesium, repeat chemistries in am    Prolonged QT interval  - QTc is 502 ms in ED  - Replace potassium, check magnesium, minimize QT-prolonging medications, repeat EKG in am   Hypertension - stable   Elevated troponin - Troponin slightly elevated and flat - She has abdominal pain radiating to chest similar to her prior episodes of gastroparesis, no acute  ischemic features on EKG, and suspicion for ACS is very low   Leukocytosis Thrombocytosis - Appears to be chronic, no apparent infectious process, will culture if febrile   Old records reviewed in assessment of this patient  Antimicrobials:   DVT prophylaxis: Heparin drip - d/c on 8/4 apixaban (ELIQUIS) tablet 5 mg   Code Status:   Code Status: Full Code Family Communication:   Disposition Plan: Status is: Inpatient  Remains inpatient appropriate because:IV treatments appropriate due to intensity of illness or inability to take PO and Inpatient level of care appropriate due to severity of illness  Dispo: The patient is from: Home              Anticipated d/c is to: Home              Patient currently is not medically stable to d/c.   Difficult to place patient No  Risk of unplanned readmission score: Unplanned Admission- Pilot do not use: 33.13   Objective: Blood pressure (!) 149/83, pulse (!) 109, temperature (!) 97.5 F (36.4 C), temperature source Oral, resp. rate 18, height 5\' 6"  (1.676 m), weight (!) 137.7 kg, last menstrual period 10/10/2012, SpO2 100 %.  Examination: General appearance: alert, cooperative, no distress, and morbidly obese Head: Normocephalic, without obvious abnormality, atraumatic Eyes:  EOMI Lungs: clear to auscultation bilaterally Heart: regular rate and rhythm and S1, S2 normal Abdomen:  Obese, soft, bowel sounds present Extremities:  Obese, trace edema Skin: mobility and turgor normal Neurologic: Grossly normal  Consultants:    Procedures:    Data Reviewed: I have personally reviewed following labs and imaging studies Results for orders placed or performed during the hospital encounter of 10/07/20 (from the past 24 hour(s))  Glucose, capillary     Status: Abnormal   Collection Time: 10/09/20  4:09 PM  Result Value Ref Range   Glucose-Capillary 261 (H) 70 - 99 mg/dL   Comment 1 Notify RN    Comment 2 Document in Chart   Glucose,  capillary     Status: Abnormal   Collection Time: 10/09/20  8:01 PM  Result Value Ref Range   Glucose-Capillary 308 (H) 70 - 99 mg/dL  Glucose, capillary     Status: Abnormal   Collection Time: 10/10/20 12:00 AM  Result Value Ref Range   Glucose-Capillary 194 (H) 70 - 99 mg/dL  Basic metabolic panel     Status: Abnormal   Collection Time: 10/10/20  3:25 AM  Result Value Ref Range   Sodium 138 135 - 145 mmol/L   Potassium 3.1 (L) 3.5 - 5.1 mmol/L   Chloride 108 98 - 111 mmol/L   CO2 21 (L) 22 - 32 mmol/L   Glucose, Bld 260 (H) 70 - 99 mg/dL   BUN 17 6 - 20 mg/dL   Creatinine, Ser 2.16 (H) 0.44 - 1.00 mg/dL   Calcium 8.5 (L) 8.9 - 10.3 mg/dL   GFR, Estimated 26 (L) >60 mL/min   Anion gap 9 5 - 15  CBC  with Differential/Platelet     Status: Abnormal   Collection Time: 10/10/20  3:25 AM  Result Value Ref Range   WBC 12.6 (H) 4.0 - 10.5 K/uL   RBC 2.47 (L) 3.87 - 5.11 MIL/uL   Hemoglobin 7.0 (L) 12.0 - 15.0 g/dL   HCT 23.5 (L) 36.0 - 46.0 %   MCV 95.1 80.0 - 100.0 fL   MCH 28.3 26.0 - 34.0 pg   MCHC 29.8 (L) 30.0 - 36.0 g/dL   RDW 17.2 (H) 11.5 - 15.5 %   Platelets 376 150 - 400 K/uL   nRBC 0.0 0.0 - 0.2 %   Neutrophils Relative % 68 %   Neutro Abs 8.7 (H) 1.7 - 7.7 K/uL   Lymphocytes Relative 20 %   Lymphs Abs 2.5 0.7 - 4.0 K/uL   Monocytes Relative 8 %   Monocytes Absolute 0.9 0.1 - 1.0 K/uL   Eosinophils Relative 3 %   Eosinophils Absolute 0.4 0.0 - 0.5 K/uL   Basophils Relative 0 %   Basophils Absolute 0.1 0.0 - 0.1 K/uL   Immature Granulocytes 1 %   Abs Immature Granulocytes 0.10 (H) 0.00 - 0.07 K/uL  Magnesium     Status: None   Collection Time: 10/10/20  3:25 AM  Result Value Ref Range   Magnesium 1.9 1.7 - 2.4 mg/dL  Glucose, capillary     Status: Abnormal   Collection Time: 10/10/20  4:02 AM  Result Value Ref Range   Glucose-Capillary 265 (H) 70 - 99 mg/dL  Glucose, capillary     Status: Abnormal   Collection Time: 10/10/20  7:51 AM  Result Value Ref Range    Glucose-Capillary 176 (H) 70 - 99 mg/dL  Prepare RBC (crossmatch)     Status: None   Collection Time: 10/10/20 10:46 AM  Result Value Ref Range   Order Confirmation      ORDER PROCESSED BY BLOOD BANK Performed at [facility], [place] [address], [place], [place] [address]   Type and screen [place]     Status: None (Preliminary result)   Collection Time: 10/10/20 11:18 AM  Result Value Ref Range   ABO/RH(D) O POS    Antibody Screen POS    Sample Expiration 10/13/2020,2359    Antibody Identification PENDING    DAT, IgG      POS Performed at [facility], [place] [address], [place], [place] [address]    Antibody ID,T [place] PENDING   Glucose, capillary     Status: Abnormal   Collection Time: 10/10/20 11:59 AM  Result Value Ref Range   Glucose-Capillary 294 (H) 70 - 99 mg/dL    Recent Results (from the past 240 hour(s))  Resp Panel by RT-PCR (Flu A&B, Covid) Nasopharyngeal Swab     Status: None   Collection Time: 10/07/20  6:52 PM   Specimen: Nasopharyngeal Swab; Nasopharyngeal(NP) swabs in vial transport medium  Result Value Ref Range Status   SARS Coronavirus 2 by RT PCR NEGATIVE NEGATIVE Final    Comment: (NOTE) SARS-CoV-2 target nucleic acids are NOT DETECTED.  The SARS-CoV-2 RNA is generally detectable in upper respiratory specimens during the acute phase of infection. The lowest concentration of SARS-CoV-2 viral copies this assay can detect is 138 copies/mL. A negative result does not preclude SARS-Cov-2 infection and should not be used as the sole basis for treatment or other patient management decisions. A negative result may occur with  improper specimen collection/handling, submission of specimen other than  nasopharyngeal swab, presence of viral mutation(s) within the areas targeted by this assay, and inadequate number of viral copies(<138 copies/mL). A negative result must be combined with clinical  observations, patient history, and epidemiological information. The expected result is Negative.  Fact Sheet for Patients:  EntrepreneurPulse.com.au  Fact Sheet for Healthcare Providers:  IncredibleEmployment.be  This test is no t yet approved or cleared by the [place] FDA and  has been authorized for detection and/or diagnosis of SARS-CoV-2 by FDA under an Emergency Use Authorization (EUA). This EUA will remain  in effect (meaning this test can be used) for the duration of the COVID-19 declaration under Section 564(b)(1) of the Act, 21 U.S.C.section 360bbb-3(b)(1), unless the authorization is terminated  or revoked sooner.       Influenza A by PCR NEGATIVE NEGATIVE Final   Influenza B by PCR NEGATIVE NEGATIVE Final    Comment: (NOTE) The Xpert Xpress SARS-CoV-2/FLU/RSV plus assay is intended as an aid in the diagnosis of influenza from Nasopharyngeal swab specimens and should not be used as a sole basis for treatment. Nasal washings and aspirates are unacceptable for Xpert Xpress SARS-CoV-2/FLU/RSV testing.  Fact Sheet for Patients: EntrepreneurPulse.com.au  Fact Sheet for Healthcare Providers: IncredibleEmployment.be  This test is not yet approved or cleared by the [place] FDA and has been authorized for detection and/or diagnosis of SARS-CoV-2 by FDA under an Emergency Use Authorization (EUA). This EUA will remain in effect (meaning this test can be used) for the duration of the COVID-19 declaration under Section 564(b)(1) of the Act, 21 U.S.C. section 360bbb-3(b)(1), unless the authorization is terminated or revoked.  Performed at [facility], [place] [address], [place], [place] [address]      Radiology Studies: No results found. DG Abd Acute W/Chest  Final Result      Scheduled Meds:  acetaminophen  650 mg Oral Q6H   apixaban  5 mg Oral BID   COVID-19 mRNA  Vac-TriS (Pfizer)  0.3 mL Intramuscular ONCE-1600   insulin aspart  0-15 Units Subcutaneous Q4H   insulin glargine-yfgn  15 Units Subcutaneous BID   lactulose  20 g Oral BID   pantoprazole (PROTONIX) IV  40 mg Intravenous Daily   senna-docusate  1 tablet Oral BID   PRN Meds: HYDROmorphone (DILAUDID) injection, LORazepam, sodium chloride flush Continuous Infusions:     LOS: 2 days  Time spent: Greater than 50% of the 35 minute visit was spent in counseling/coordination of care for the patient as laid out in the A&P.   Dwyane Dee, MD [facility] 10/10/2020, 3:03 PM

## 2020-10-10 NOTE — Progress Notes (Signed)
Initial Nutrition Assessment  DOCUMENTATION CODES:   Morbid obesity  INTERVENTION:  - will order 30 ml Prosource Plus TID, each supplement provides 100 kcal and 15 grams protein.  - will order 1 tablet multivitamin with minerals/day.  - will monitor for need for diet education prior to d/c.   NUTRITION DIAGNOSIS:   Inadequate oral intake related to acute illness, nausea, vomiting as evidenced by per patient/family report.  GOAL:   Patient will meet greater than or equal to 90% of their needs  MONITOR:   PO intake, Supplement acceptance, Labs, Weight trends  REASON FOR ASSESSMENT:   Malnutrition Screening Tool  ASSESSMENT:   55 y.o. female with medical history of CHF, stage 3 CKD, carcinoid tumor of the duodenum, uncontrolled type 2 DM, gastroparesis, chronic pain, cirrhosis, and afib on Eliquis. She presented to the ED for evaluation of severe abdominal pain and N/V. She reported last BM was 3 days prior to presentation.  She ate 100% of breakfast this AM (646 kcal and 28 grams protein). Unable to see patient x2 attempts.   She was last seen by a [place] RD on 08/07/19.   Weight today is 303 lb and weight yesterday was 299 lb. Weight has been stable since 06/05/20.   Per notes: - intractable abdominal pain and N/V - hx of gastroparesis with no findings on radiographs, unable to tolerate reglan - AKI on stage 3 CKD - normocytic anemia - uncontrolled DM with HgbA1c of 10.5% in 07/2020   Labs reviewed; CBGs: 194, 265, 176, 294 mg/dl, K: 3.1 mmol/l, creatinine: 2.16 mg/dl, Ca: 8.5 mg/dl, GFR: 26 ml/min.  Medications reviewed; sliding scale novolog, 15 units semglee BID, 2 g IV Mg sulfate x1 run 8/5, 40 mg IV protonix/day, 10 mEq IV KCl x4 runs 8/5, 1 tablet senokot BID.    NUTRITION - FOCUSED PHYSICAL EXAM:  Unable to complete at this time.   Diet Order:   Diet Order             DIET SOFT Room service appropriate? Yes; Fluid consistency: Thin  Diet effective now                    EDUCATION NEEDS:   No education needs have been identified at this time  Skin:  Skin Assessment: Reviewed RN Assessment  Last BM:  8/4  Height:   Ht Readings from Last 1 Encounters:  10/08/20 5\' 6"  (1.676 m)    Weight:   Wt Readings from Last 1 Encounters:  10/10/20 (!) 137.7 kg     Estimated Nutritional Needs:  Kcal:  2060-2200 kcal Protein:  100-115 grams Fluid:  >/= 2.2 L/day      Jarome Matin, MS, RD, LDN, CNSC Inpatient Clinical Dietitian RD pager # available in [facility]  After hours/weekend pager # available in [place]

## 2020-10-10 NOTE — Care Management Important Message (Signed)
Important Message  Patient Details IM Letter given to the Patient. Name: Deborah [name] MRN: [id number] Date of Birth: 1965/06/09   Medicare Important Message Given:  Yes     Kerin Salen 10/10/2020, 10:36 AM

## 2020-10-11 DIAGNOSIS — R112 Nausea with vomiting, unspecified: Secondary | ICD-10-CM | POA: Diagnosis not present

## 2020-10-11 LAB — CBC WITH DIFFERENTIAL/PLATELET
Abs Immature Granulocytes: 0.09 10*3/uL — ABNORMAL HIGH (ref 0.00–0.07)
Basophils Absolute: 0.1 10*3/uL (ref 0.0–0.1)
Basophils Relative: 0 %
Eosinophils Absolute: 0.5 10*3/uL (ref 0.0–0.5)
Eosinophils Relative: 3 %
HCT: 27.4 % — ABNORMAL LOW (ref 36.0–46.0)
Hemoglobin: 8.2 g/dL — ABNORMAL LOW (ref 12.0–15.0)
Immature Granulocytes: 1 %
Lymphocytes Relative: 17 %
Lymphs Abs: 2.3 10*3/uL (ref 0.7–4.0)
MCH: 27.8 pg (ref 26.0–34.0)
MCHC: 29.9 g/dL — ABNORMAL LOW (ref 30.0–36.0)
MCV: 92.9 fL (ref 80.0–100.0)
Monocytes Absolute: 1 10*3/uL (ref 0.1–1.0)
Monocytes Relative: 7 %
Neutro Abs: 10.1 10*3/uL — ABNORMAL HIGH (ref 1.7–7.7)
Neutrophils Relative %: 72 %
Platelets: 388 10*3/uL (ref 150–400)
RBC: 2.95 MIL/uL — ABNORMAL LOW (ref 3.87–5.11)
RDW: 17.6 % — ABNORMAL HIGH (ref 11.5–15.5)
WBC: 14 10*3/uL — ABNORMAL HIGH (ref 4.0–10.5)
nRBC: 0 % (ref 0.0–0.2)

## 2020-10-11 LAB — BASIC METABOLIC PANEL
Anion gap: 7 (ref 5–15)
BUN: 22 mg/dL — ABNORMAL HIGH (ref 6–20)
CO2: 21 mmol/L — ABNORMAL LOW (ref 22–32)
Calcium: 8.6 mg/dL — ABNORMAL LOW (ref 8.9–10.3)
Chloride: 106 mmol/L (ref 98–111)
Creatinine, Ser: 1.76 mg/dL — ABNORMAL HIGH (ref 0.44–1.00)
GFR, Estimated: 34 mL/min — ABNORMAL LOW (ref >60)
Glucose, Bld: 309 mg/dL — ABNORMAL HIGH (ref 70–99)
Potassium: 3.7 mmol/L (ref 3.5–5.1)
Sodium: 134 mmol/L — ABNORMAL LOW (ref 135–145)

## 2020-10-11 LAB — TYPE AND SCREEN
ABO/RH(D): O POS
Antibody Screen: POSITIVE
DAT, IgG: POSITIVE
Donor AG Type: NEGATIVE
Unit division: 0

## 2020-10-11 LAB — GLUCOSE, CAPILLARY
Glucose-Capillary: 265 mg/dL — ABNORMAL HIGH (ref 70–99)
Glucose-Capillary: 196 mg/dL — ABNORMAL HIGH (ref 70–99)
Glucose-Capillary: 219 mg/dL — ABNORMAL HIGH (ref 70–99)
Glucose-Capillary: 298 mg/dL — ABNORMAL HIGH (ref 70–99)
Glucose-Capillary: 314 mg/dL — ABNORMAL HIGH (ref 70–99)

## 2020-10-11 LAB — BPAM RBC
Blood Product Expiration Date: 202209102359
ISSUE DATE / TIME: 202208051950
Unit Type and Rh: 5100

## 2020-10-11 LAB — MAGNESIUM: Magnesium: 2.1 mg/dL (ref 1.7–2.4)

## 2020-10-11 MED ORDER — METOPROLOL SUCCINATE ER 50 MG PO TB24
50.0000 mg | ORAL_TABLET | Freq: Every day | ORAL | Status: DC
  Administered 2020-10-12: 50 mg via ORAL
  Filled 2020-10-11: qty 1

## 2020-10-11 MED ORDER — INSULIN GLARGINE-YFGN 100 UNIT/ML ~~LOC~~ SOLN
20.0000 [IU] | Freq: Two times a day (BID) | SUBCUTANEOUS | Status: DC
  Administered 2020-10-11 – 2020-10-13 (×4): 20 [IU] via SUBCUTANEOUS
  Filled 2020-10-11 (×4): qty 0.2

## 2020-10-11 MED ORDER — LABETALOL HCL 5 MG/ML IV SOLN: 10.0000 mg | INTRAVENOUS | Status: DC | PRN

## 2020-10-11 MED ORDER — HYDRALAZINE HCL 25 MG PO TABS: 25.0000 mg | ORAL_TABLET | ORAL | Status: DC | PRN

## 2020-10-11 MED ORDER — INSULIN GLARGINE-YFGN 100 UNIT/ML ~~LOC~~ SOLN
5.0000 [IU] | Freq: Once | SUBCUTANEOUS | Status: AC
  Administered 2020-10-11: 5 [IU] via SUBCUTANEOUS
  Filled 2020-10-11: qty 0.05

## 2020-10-11 MED ORDER — ISOSORBIDE MONONITRATE ER 60 MG PO TB24
60.0000 mg | ORAL_TABLET | Freq: Every day | ORAL | Status: DC
  Administered 2020-10-11 – 2020-10-13 (×3): 60 mg via ORAL
  Filled 2020-10-11 (×3): qty 1

## 2020-10-11 MED ORDER — SODIUM CHLORIDE 0.9 % IV SOLN: INTRAVENOUS | Status: DC | PRN

## 2020-10-11 MED ORDER — HYDRALAZINE HCL 50 MG PO TABS
50.0000 mg | ORAL_TABLET | Freq: Three times a day (TID) | ORAL | Status: DC
  Administered 2020-10-11 – 2020-10-13 (×4): 50 mg via ORAL
  Filled 2020-10-11 (×5): qty 1

## 2020-10-11 NOTE — Progress Notes (Signed)
Pt became very upset that I connected her to a KVO  IV fluids of NS 10cc hr to her PICC line per protocol. Pt had been without KVO fluids and states she told the Dr she did not want all those extra fluids. Explained protocol about it not being protocol to give IV dilaudid followed by a saline flush, that KVO fluids needed to be running.  Each time I tried to give meds or start the blood the PICC line acted as if it were clotting off ( resistant to flush) , eventually a flush was used to flush the PICC . Risk of a blood clot or needing another IV was explained to the pt A second nurse went in to explain this to the pt & pt reluctantly agreed to [facility] fluids.

## 2020-10-11 NOTE — Progress Notes (Signed)
Progress Note    Deborah [name]   ZSW:[id number]  DOB: [birth year]  DOA: 10/07/2020     3  PCP: Pcp, No  CC: abd pain  Hospital Course: Deborah [name] is a 55 y.o. female with medical history significant for systolic CHF, chronic kidney disease stage IIIb, carcinoid tumor of the duodenum, uncontrolled type 2 diabetes mellitus, gastroparesis, chronic pain, cirrhosis, and paroxysmal atrial fibrillation on Eliquis, now presenting to the emergency department for evaluation of severe abdominal pain, nausea, and vomiting.  Patient reports that she developed abdominal pain over the past day that has become severe and she ran out of her home pain medications.  Last bowel movement was 3 days ago and loose.  She reports chronic diarrhea.  She continues to pass flatus.  She denies fevers or chills.  She denies dysuria or flank pain.  Pain is severe, beginning in the mid abdomen, and radiating towards her chest, similar to pain she has had frequently in the past.  Taking any oral medications including Eliquis for the past 3 or 4 days due to her nausea and vomiting.  Denies melena, hematochezia, or hematemesis.   Upon arrival to the ED, patient is found to be afebrile, saturating well on room air, slightly tachycardic, and with stable blood pressure.  EKG features sinus tachycardia with rate 110 and QTc interval 502 ms.  Radiographs of the chest and abdomen are negative.  Chemistry panel with potassium 2.6, glucose 218, and creatinine 2.01.  CBC with WBC 15,500, hemoglobin 8.6, and platelets 551,000, all of which appear similar to prior values.  Patient was given a liter of saline, IV PPI, IV labetalol, IV Haldol, Dilaudid, and potassium in the ED. She continues to have severe symptoms and is admitted for further treatment and work-up.  Interval History:  Tolerated 1 unit PRBC yesterday.  Finished around 11 PM.  Hemoglobin improved this morning.  Still having generalized pains but it might be  starting to improve.  She still did not wish to adjust pain regimen at this time. Tolerating diet better.  ROS: Constitutional: negative for chills and fevers, Respiratory: negative for cough, Cardiovascular: negative for chest pain, and Gastrointestinal: negative for abdominal pain  Assessment & Plan: Intractable abdominal pain and N/V  - Patient with hx of chronic abdominal pain and diabetic gastroparesis p/w 3-4 days N/V and worsening abdominal pain   - No acute findings on plain radiographs, symptoms same as prior episodes of gastroparesis  - She has been unable to tolerate Reglan d/t extrapyramidal sxs and treatment complicated by prolonged QT interval  -Continue soft diet   AKI superimposed on CKD IIIb  - SCr is 2.01 on admission, up from 1.64 on July 10th  - She likely has acute prerenal azotemia in setting of hypovolemia  - She was given a liter NS in ED, has hx of CHF  -Discontinue fluids  Normocytic anemia -Likely contributed to from underlying CKD - Hemoglobin has down trended to 7 g/dL on 8/5.  No overt signs of bleeding - repeat Hgb on 8/6 s/p 1 unit PRBC is 8.2 g/dL - CBC daily    Uncontrolled IDDM  - A1c was 10.5% in May 2022  - Continue CBG checks and insulin     Paroxysmal atrial fibrillation  - In sinus rhythm on admission  - CHADS-VASc 6 (CVA x2, gender, CHF, DM, HTN) -Now that tolerating some oral intake, discontinue heparin drip and resume Eliquis   Chronic systolic CHF  - Appears hypovolemic  in setting of  3-4 days N/V -S/p fluids on admission   Hypokalemia   - Serum potassium 2.6 secondary to GI-losses  - Replace, give empiric magnesium, repeat chemistries in am    Prolonged QT interval  - QTc is 502 ms in ED  - Replace potassium, check magnesium, minimize QT-prolonging medications, repeat EKG in am   Hypertension - stable   Elevated troponin - Troponin slightly elevated and flat - She has abdominal pain radiating to chest similar to her prior  episodes of gastroparesis, no acute ischemic features on EKG, and suspicion for ACS is very low   Leukocytosis Thrombocytosis - Appears to be chronic, no apparent infectious process, will culture if febrile   Old records reviewed in assessment of this patient  Antimicrobials:   DVT prophylaxis: Heparin drip - d/c on 8/4 apixaban (ELIQUIS) tablet 5 mg   Code Status:   Code Status: Full Code Family Communication:   Disposition Plan: Status is: Inpatient  Remains inpatient appropriate because:IV treatments appropriate due to intensity of illness or inability to take PO and Inpatient level of care appropriate due to severity of illness  Dispo: The patient is from: Home              Anticipated d/c is to: Home              Patient currently is not medically stable to d/c.   Difficult to place patient No  Risk of unplanned readmission score: Unplanned Admission- Pilot do not use: 37.37   Objective: Blood pressure (!) 158/70, pulse (!) 110, temperature 98.2 F (36.8 C), temperature source Oral, resp. rate 18, height 5\' 6"  (1.676 m), weight (!) 141.4 kg, last menstrual period 10/10/2012, SpO2 100 %.  Examination: General appearance: alert, cooperative, no distress, and morbidly obese Head: Normocephalic, without obvious abnormality, atraumatic Eyes:  EOMI Lungs: clear to auscultation bilaterally Heart: regular rate and rhythm and S1, S2 normal Abdomen:  Obese, soft, bowel sounds present Extremities:  Obese, trace edema Skin: mobility and turgor normal Neurologic: Grossly normal  Consultants:    Procedures:    Data Reviewed: I have personally reviewed following labs and imaging studies Results for orders placed or performed during the hospital encounter of 10/07/20 (from the past 24 hour(s))  Glucose, capillary     Status: Abnormal   Collection Time: 10/10/20  4:49 PM  Result Value Ref Range   Glucose-Capillary 266 (H) 70 - 99 mg/dL  Glucose, capillary     Status:  Abnormal   Collection Time: 10/10/20  7:45 PM  Result Value Ref Range   Glucose-Capillary 293 (H) 70 - 99 mg/dL  Glucose, capillary     Status: Abnormal   Collection Time: 10/10/20 11:56 PM  Result Value Ref Range   Glucose-Capillary 210 (H) 70 - 99 mg/dL  Basic metabolic panel     Status: Abnormal   Collection Time: 10/11/20  3:45 AM  Result Value Ref Range   Sodium 134 (L) 135 - 145 mmol/L   Potassium 3.7 3.5 - 5.1 mmol/L   Chloride 106 98 - 111 mmol/L   CO2 21 (L) 22 - 32 mmol/L   Glucose, Bld 309 (H) 70 - 99 mg/dL   BUN 22 (H) 6 - 20 mg/dL   Creatinine, Ser 1.76 (H) 0.44 - 1.00 mg/dL   Calcium 8.6 (L) 8.9 - 10.3 mg/dL   GFR, Estimated 34 (L) >60 mL/min   Anion gap 7 5 - 15  CBC with Differential/Platelet  Status: Abnormal   Collection Time: 10/11/20  3:45 AM  Result Value Ref Range   WBC 14.0 (H) 4.0 - 10.5 K/uL   RBC 2.95 (L) 3.87 - 5.11 MIL/uL   Hemoglobin 8.2 (L) 12.0 - 15.0 g/dL   HCT 27.4 (L) 36.0 - 46.0 %   MCV 92.9 80.0 - 100.0 fL   MCH 27.8 26.0 - 34.0 pg   MCHC 29.9 (L) 30.0 - 36.0 g/dL   RDW 17.6 (H) 11.5 - 15.5 %   Platelets 388 150 - 400 K/uL   nRBC 0.0 0.0 - 0.2 %   Neutrophils Relative % 72 %   Neutro Abs 10.1 (H) 1.7 - 7.7 K/uL   Lymphocytes Relative 17 %   Lymphs Abs 2.3 0.7 - 4.0 K/uL   Monocytes Relative 7 %   Monocytes Absolute 1.0 0.1 - 1.0 K/uL   Eosinophils Relative 3 %   Eosinophils Absolute 0.5 0.0 - 0.5 K/uL   Basophils Relative 0 %   Basophils Absolute 0.1 0.0 - 0.1 K/uL   Immature Granulocytes 1 %   Abs Immature Granulocytes 0.09 (H) 0.00 - 0.07 K/uL  Magnesium     Status: None   Collection Time: 10/11/20  3:45 AM  Result Value Ref Range   Magnesium 2.1 1.7 - 2.4 mg/dL  Glucose, capillary     Status: Abnormal   Collection Time: 10/11/20  3:55 AM  Result Value Ref Range   Glucose-Capillary 265 (H) 70 - 99 mg/dL  Glucose, capillary     Status: Abnormal   Collection Time: 10/11/20  7:20 AM  Result Value Ref Range    Glucose-Capillary 196 (H) 70 - 99 mg/dL  Glucose, capillary     Status: Abnormal   Collection Time: 10/11/20 12:02 PM  Result Value Ref Range   Glucose-Capillary 219 (H) 70 - 99 mg/dL    Recent Results (from the past 240 hour(s))  Resp Panel by RT-PCR (Flu A&B, Covid) Nasopharyngeal Swab     Status: None   Collection Time: 10/07/20  6:52 PM   Specimen: Nasopharyngeal Swab; Nasopharyngeal(NP) swabs in vial transport medium  Result Value Ref Range Status   SARS Coronavirus 2 by RT PCR NEGATIVE NEGATIVE Final    Comment: (NOTE) SARS-CoV-2 target nucleic acids are NOT DETECTED.  The SARS-CoV-2 RNA is generally detectable in upper respiratory specimens during the acute phase of infection. The lowest concentration of SARS-CoV-2 viral copies this assay can detect is 138 copies/mL. A negative result does not preclude SARS-Cov-2 infection and should not be used as the sole basis for treatment or other patient management decisions. A negative result may occur with  improper specimen collection/handling, submission of specimen other than nasopharyngeal swab, presence of viral mutation(s) within the areas targeted by this assay, and inadequate number of viral copies(<138 copies/mL). A negative result must be combined with clinical observations, patient history, and epidemiological information. The expected result is Negative.  Fact Sheet for Patients:  EntrepreneurPulse.com.au  Fact Sheet for Healthcare Providers:  IncredibleEmployment.be  This test is no t yet approved or cleared by the [place] FDA and  has been authorized for detection and/or diagnosis of SARS-CoV-2 by FDA under an Emergency Use Authorization (EUA). This EUA will remain  in effect (meaning this test can be used) for the duration of the COVID-19 declaration under Section 564(b)(1) of the Act, 21 U.S.C.section 360bbb-3(b)(1), unless the authorization is terminated  or revoked  sooner.       Influenza A by PCR NEGATIVE NEGATIVE  Final   Influenza B by PCR NEGATIVE NEGATIVE Final    Comment: (NOTE) The Xpert Xpress SARS-CoV-2/FLU/RSV plus assay is intended as an aid in the diagnosis of influenza from Nasopharyngeal swab specimens and should not be used as a sole basis for treatment. Nasal washings and aspirates are unacceptable for Xpert Xpress SARS-CoV-2/FLU/RSV testing.  Fact Sheet for Patients: EntrepreneurPulse.com.au  Fact Sheet for Healthcare Providers: IncredibleEmployment.be  This test is not yet approved or cleared by the [place] FDA and has been authorized for detection and/or diagnosis of SARS-CoV-2 by FDA under an Emergency Use Authorization (EUA). This EUA will remain in effect (meaning this test can be used) for the duration of the COVID-19 declaration under Section 564(b)(1) of the Act, 21 U.S.C. section 360bbb-3(b)(1), unless the authorization is terminated or revoked.  Performed at [facility], [place] [address] [place], [place] [address]      Radiology Studies: No results found. DG Abd Acute W/Chest  Final Result      Scheduled Meds:  (feeding supplement) PROSource Plus  30 mL Oral TID BM   acetaminophen  650 mg Oral Q6H   apixaban  5 mg Oral BID   insulin aspart  0-15 Units Subcutaneous Q4H   insulin glargine-yfgn  20 Units Subcutaneous BID   lactulose  20 g Oral BID   multivitamin with minerals  1 tablet Oral Daily   pantoprazole (PROTONIX) IV  40 mg Intravenous Daily   senna-docusate  1 tablet Oral BID   PRN Meds: sodium chloride, HYDROmorphone (DILAUDID) injection, LORazepam, sodium chloride flush Continuous Infusions:  sodium chloride 10 mL/hr at 10/11/20 0225      LOS: 3 days  Time spent: Greater than 50% of the 35 minute visit was spent in counseling/coordination of care for the patient as laid out in the A&P.   Dwyane Dee, MD [facility] 10/11/2020, 1:58 PM

## 2020-10-11 NOTE — Progress Notes (Signed)
Pt requiring pain medication Q 3hrs. She will be asleep and wake up wanting meds prior to the Q 3 hour PRN dose is available

## 2020-10-12 DIAGNOSIS — R112 Nausea with vomiting, unspecified: Secondary | ICD-10-CM | POA: Diagnosis not present

## 2020-10-12 LAB — GLUCOSE, CAPILLARY
Glucose-Capillary: 188 mg/dL — ABNORMAL HIGH (ref 70–99)
Glucose-Capillary: 196 mg/dL — ABNORMAL HIGH (ref 70–99)
Glucose-Capillary: 197 mg/dL — ABNORMAL HIGH (ref 70–99)
Glucose-Capillary: 210 mg/dL — ABNORMAL HIGH (ref 70–99)
Glucose-Capillary: 347 mg/dL — ABNORMAL HIGH (ref 70–99)
Glucose-Capillary: 349 mg/dL — ABNORMAL HIGH (ref 70–99)

## 2020-10-12 LAB — BASIC METABOLIC PANEL
Anion gap: 4 — ABNORMAL LOW (ref 5–15)
BUN: 24 mg/dL — ABNORMAL HIGH (ref 6–20)
CO2: 22 mmol/L (ref 22–32)
Calcium: 8.3 mg/dL — ABNORMAL LOW (ref 8.9–10.3)
Chloride: 109 mmol/L (ref 98–111)
Creatinine, Ser: 1.7 mg/dL — ABNORMAL HIGH (ref 0.44–1.00)
GFR, Estimated: 35 mL/min — ABNORMAL LOW (ref >60)
Glucose, Bld: 231 mg/dL — ABNORMAL HIGH (ref 70–99)
Potassium: 3.4 mmol/L — ABNORMAL LOW (ref 3.5–5.1)
Sodium: 135 mmol/L (ref 135–145)

## 2020-10-12 LAB — CBC WITH DIFFERENTIAL/PLATELET
Abs Immature Granulocytes: 0.09 10*3/uL — ABNORMAL HIGH (ref 0.00–0.07)
Basophils Absolute: 0 10*3/uL (ref 0.0–0.1)
Basophils Relative: 0 %
Eosinophils Absolute: 0.5 10*3/uL (ref 0.0–0.5)
Eosinophils Relative: 4 %
HCT: 23.8 % — ABNORMAL LOW (ref 36.0–46.0)
Hemoglobin: 7.2 g/dL — ABNORMAL LOW (ref 12.0–15.0)
Immature Granulocytes: 1 %
Lymphocytes Relative: 16 %
Lymphs Abs: 2.2 10*3/uL (ref 0.7–4.0)
MCH: 28.1 pg (ref 26.0–34.0)
MCHC: 30.3 g/dL (ref 30.0–36.0)
MCV: 93 fL (ref 80.0–100.0)
Monocytes Absolute: 0.9 10*3/uL (ref 0.1–1.0)
Monocytes Relative: 7 %
Neutro Abs: 9.5 10*3/uL — ABNORMAL HIGH (ref 1.7–7.7)
Neutrophils Relative %: 72 %
Platelets: 357 10*3/uL (ref 150–400)
RBC: 2.56 MIL/uL — ABNORMAL LOW (ref 3.87–5.11)
RDW: 17.5 % — ABNORMAL HIGH (ref 11.5–15.5)
WBC: 13.1 10*3/uL — ABNORMAL HIGH (ref 4.0–10.5)
nRBC: 0 % (ref 0.0–0.2)

## 2020-10-12 LAB — MAGNESIUM: Magnesium: 1.8 mg/dL (ref 1.7–2.4)

## 2020-10-12 MED ORDER — POTASSIUM CHLORIDE CRYS ER 10 MEQ PO TBCR
40.0000 meq | EXTENDED_RELEASE_TABLET | Freq: Once | ORAL | Status: AC
  Administered 2020-10-12: 40 meq via ORAL
  Filled 2020-10-12: qty 4

## 2020-10-12 NOTE — Progress Notes (Signed)
Progress Note    Deborah [name]   FIE:[id number]  DOB: 28-Jun-1965  DOA: 10/07/2020     4  PCP: Pcp, No  CC: abd pain  Hospital Course: Deborah [name] is a 56 y.o. female with medical history significant for systolic CHF, chronic kidney disease stage IIIb, carcinoid tumor of the duodenum, uncontrolled type 2 diabetes mellitus, gastroparesis, chronic pain, cirrhosis, and paroxysmal atrial fibrillation on Eliquis, now presenting to the emergency department for evaluation of severe abdominal pain, nausea, and vomiting.  Patient reports that she developed abdominal pain over the past day that has become severe and she ran out of her home pain medications.  Last bowel movement was 3 days ago and loose.  She reports chronic diarrhea.  She continues to pass flatus.  She denies fevers or chills.  She denies dysuria or flank pain.  Pain is severe, beginning in the mid abdomen, and radiating towards her chest, similar to pain she has had frequently in the past.  Taking any oral medications including Eliquis for the past 3 or 4 days due to her nausea and vomiting.  Denies melena, hematochezia, or hematemesis.   Upon arrival to the ED, patient is found to be afebrile, saturating well on room air, slightly tachycardic, and with stable blood pressure.  EKG features sinus tachycardia with rate 110 and QTc interval 502 ms.  Radiographs of the chest and abdomen are negative.  Chemistry panel with potassium 2.6, glucose 218, and creatinine 2.01.  CBC with WBC 15,500, hemoglobin 8.6, and platelets 551,000, all of which appear similar to prior values.  Patient was given a liter of saline, IV PPI, IV labetalol, IV Haldol, Dilaudid, and potassium in the ED. She continues to have severe symptoms and is admitted for further treatment and work-up.  Interval History:  No events overnight.  States that she plans on going home tomorrow and that the pain control we have been doing has been helping.  She has been  tolerating her diet.  ROS: Constitutional: negative for chills and fevers, Respiratory: negative for cough, Cardiovascular: negative for chest pain, and Gastrointestinal: negative for abdominal pain  Assessment & Plan: Intractable abdominal pain and N/V  - Patient with hx of chronic abdominal pain and diabetic gastroparesis p/w 3-4 days N/V and worsening abdominal pain   - No acute findings on plain radiographs, symptoms same as prior episodes of gastroparesis  - She has been unable to tolerate Reglan d/t extrapyramidal sxs and treatment complicated by prolonged QT interval  -Continue soft diet -Tentative plan for discharge home on Monday   AKI superimposed on CKD IIIb  - SCr is 2.01 on admission, up from 1.64 on July 10th  - She likely has acute prerenal azotemia in setting of hypovolemia  - She was given a liter NS in ED, has hx of CHF  -Discontinue fluids  Normocytic anemia -Likely contributed to from underlying CKD - Hemoglobin has down trended to 7 g/dL on 8/5.  No overt signs of bleeding - repeat Hgb on 8/6 s/p 1 unit PRBC is 8.2 g/dL - CBC daily    Uncontrolled IDDM  - A1c was 10.5% in May 2022  - Continue CBG checks and insulin     Paroxysmal atrial fibrillation  - In sinus rhythm on admission  - CHADS-VASc 6 (CVA x2, gender, CHF, DM, HTN) -Now that tolerating some oral intake, discontinue heparin drip and resume Eliquis   Chronic systolic CHF  - Appears hypovolemic in setting of  3-4  days N/V -S/p fluids on admission   Hypokalemia   - Serum potassium 2.6 secondary to GI-losses  - Replace, give empiric magnesium, repeat chemistries in am    Prolonged QT interval  - QTc is 502 ms in ED  - Replace potassium, check magnesium, minimize QT-prolonging medications, repeat EKG in am   Hypertension - stable   Elevated troponin - Troponin slightly elevated and flat - She has abdominal pain radiating to chest similar to her prior episodes of gastroparesis, no acute  ischemic features on EKG, and suspicion for ACS is very low   Leukocytosis Thrombocytosis - Appears to be chronic, no apparent infectious process, will culture if febrile   Old records reviewed in assessment of this patient  Antimicrobials:   DVT prophylaxis: Heparin drip - d/c on 8/4 apixaban (ELIQUIS) tablet 5 mg   Code Status:   Code Status: Full Code Family Communication:   Disposition Plan: Status is: Inpatient  Remains inpatient appropriate because:IV treatments appropriate due to intensity of illness or inability to take PO and Inpatient level of care appropriate due to severity of illness  Dispo: The patient is from: Home              Anticipated d/c is to: Home              Patient currently is medically stable to d/c.   Difficult to place patient No  Risk of unplanned readmission score: Unplanned Admission- Pilot do not use: 39.26   Objective: Blood pressure (!) 173/89, pulse (!) 106, temperature (!) 97.4 F (36.3 C), resp. rate 15, height 5\' 6"  (1.676 m), weight (!) 143.5 kg, last menstrual period 10/10/2012, SpO2 100 %.  Examination: General appearance: alert, cooperative, no distress, and morbidly obese Head: Normocephalic, without obvious abnormality, atraumatic Eyes:  EOMI Lungs: clear to auscultation bilaterally Heart: regular rate and rhythm and S1, S2 normal Abdomen:  Obese, soft, bowel sounds present Extremities:  Obese, LE edema Skin: mobility and turgor normal Neurologic: Grossly normal  Consultants:    Procedures:    Data Reviewed: I have personally reviewed following labs and imaging studies Results for orders placed or performed during the hospital encounter of 10/07/20 (from the past 24 hour(s))  Glucose, capillary     Status: Abnormal   Collection Time: 10/11/20  4:28 PM  Result Value Ref Range   Glucose-Capillary 298 (H) 70 - 99 mg/dL  Glucose, capillary     Status: Abnormal   Collection Time: 10/11/20  8:18 PM  Result Value Ref  Range   Glucose-Capillary 314 (H) 70 - 99 mg/dL  Glucose, capillary     Status: Abnormal   Collection Time: 10/12/20 12:00 AM  Result Value Ref Range   Glucose-Capillary 197 (H) 70 - 99 mg/dL  Glucose, capillary     Status: Abnormal   Collection Time: 10/12/20  4:02 AM  Result Value Ref Range   Glucose-Capillary 196 (H) 70 - 99 mg/dL  Basic metabolic panel     Status: Abnormal   Collection Time: 10/12/20  5:26 AM  Result Value Ref Range   Sodium 135 135 - 145 mmol/L   Potassium 3.4 (L) 3.5 - 5.1 mmol/L   Chloride 109 98 - 111 mmol/L   CO2 22 22 - 32 mmol/L   Glucose, Bld 231 (H) 70 - 99 mg/dL   BUN 24 (H) 6 - 20 mg/dL   Creatinine, Ser 1.70 (H) 0.44 - 1.00 mg/dL   Calcium 8.3 (L) 8.9 - 10.3 mg/dL  GFR, Estimated 35 (L) >60 mL/min   Anion gap 4 (L) 5 - 15  CBC with Differential/Platelet     Status: Abnormal   Collection Time: 10/12/20  5:26 AM  Result Value Ref Range   WBC 13.1 (H) 4.0 - 10.5 K/uL   RBC 2.56 (L) 3.87 - 5.11 MIL/uL   Hemoglobin 7.2 (L) 12.0 - 15.0 g/dL   HCT 23.8 (L) 36.0 - 46.0 %   MCV 93.0 80.0 - 100.0 fL   MCH 28.1 26.0 - 34.0 pg   MCHC 30.3 30.0 - 36.0 g/dL   RDW 17.5 (H) 11.5 - 15.5 %   Platelets 357 150 - 400 K/uL   nRBC 0.0 0.0 - 0.2 %   Neutrophils Relative % 72 %   Neutro Abs 9.5 (H) 1.7 - 7.7 K/uL   Lymphocytes Relative 16 %   Lymphs Abs 2.2 0.7 - 4.0 K/uL   Monocytes Relative 7 %   Monocytes Absolute 0.9 0.1 - 1.0 K/uL   Eosinophils Relative 4 %   Eosinophils Absolute 0.5 0.0 - 0.5 K/uL   Basophils Relative 0 %   Basophils Absolute 0.0 0.0 - 0.1 K/uL   Immature Granulocytes 1 %   Abs Immature Granulocytes 0.09 (H) 0.00 - 0.07 K/uL  Magnesium     Status: None   Collection Time: 10/12/20  5:26 AM  Result Value Ref Range   Magnesium 1.8 1.7 - 2.4 mg/dL  Glucose, capillary     Status: Abnormal   Collection Time: 10/12/20  7:42 AM  Result Value Ref Range   Glucose-Capillary 188 (H) 70 - 99 mg/dL  Glucose, capillary     Status: Abnormal    Collection Time: 10/12/20 11:51 AM  Result Value Ref Range   Glucose-Capillary 210 (H) 70 - 99 mg/dL    Recent Results (from the past 240 hour(s))  Resp Panel by RT-PCR (Flu A&B, Covid) Nasopharyngeal Swab     Status: None   Collection Time: 10/07/20  6:52 PM   Specimen: Nasopharyngeal Swab; Nasopharyngeal(NP) swabs in vial transport medium  Result Value Ref Range Status   SARS Coronavirus 2 by RT PCR NEGATIVE NEGATIVE Final    Comment: (NOTE) SARS-CoV-2 target nucleic acids are NOT DETECTED.  The SARS-CoV-2 RNA is generally detectable in upper respiratory specimens during the acute phase of infection. The lowest concentration of SARS-CoV-2 viral copies this assay can detect is 138 copies/mL. A negative result does not preclude SARS-Cov-2 infection and should not be used as the sole basis for treatment or other patient management decisions. A negative result may occur with  improper specimen collection/handling, submission of specimen other than nasopharyngeal swab, presence of viral mutation(s) within the areas targeted by this assay, and inadequate number of viral copies(<138 copies/mL). A negative result must be combined with clinical observations, patient history, and epidemiological information. The expected result is Negative.  Fact Sheet for Patients:  EntrepreneurPulse.com.au  Fact Sheet for Healthcare Providers:  IncredibleEmployment.be  This test is no t yet approved or cleared by the [place] FDA and  has been authorized for detection and/or diagnosis of SARS-CoV-2 by FDA under an Emergency Use Authorization (EUA). This EUA will remain  in effect (meaning this test can be used) for the duration of the COVID-19 declaration under Section 564(b)(1) of the Act, 21 U.S.C.section 360bbb-3(b)(1), unless the authorization is terminated  or revoked sooner.       Influenza A by PCR NEGATIVE NEGATIVE Final   Influenza B by PCR  NEGATIVE NEGATIVE  Final    Comment: (NOTE) The Xpert Xpress SARS-CoV-2/FLU/RSV plus assay is intended as an aid in the diagnosis of influenza from Nasopharyngeal swab specimens and should not be used as a sole basis for treatment. Nasal washings and aspirates are unacceptable for Xpert Xpress SARS-CoV-2/FLU/RSV testing.  Fact Sheet for Patients: EntrepreneurPulse.com.au  Fact Sheet for Healthcare Providers: IncredibleEmployment.be  This test is not yet approved or cleared by the [place] FDA and has been authorized for detection and/or diagnosis of SARS-CoV-2 by FDA under an Emergency Use Authorization (EUA). This EUA will remain in effect (meaning this test can be used) for the duration of the COVID-19 declaration under Section 564(b)(1) of the Act, 21 U.S.C. section 360bbb-3(b)(1), unless the authorization is terminated or revoked.  Performed at [facility], [place] [address] [place],  [address]      Radiology Studies: No results found. DG Abd Acute W/Chest  Final Result      Scheduled Meds:  (feeding supplement) PROSource Plus  30 mL Oral TID BM   acetaminophen  650 mg Oral Q6H   apixaban  5 mg Oral BID   hydrALAZINE  50 mg Oral TID   insulin aspart  0-15 Units Subcutaneous Q4H   insulin glargine-yfgn  20 Units Subcutaneous BID   isosorbide mononitrate  60 mg Oral Daily   lactulose  20 g Oral BID   metoprolol succinate  50 mg Oral QHS   multivitamin with minerals  1 tablet Oral Daily   pantoprazole (PROTONIX) IV  40 mg Intravenous Daily   senna-docusate  1 tablet Oral BID   PRN Meds: sodium chloride, hydrALAZINE, HYDROmorphone (DILAUDID) injection, labetalol, LORazepam, sodium chloride flush Continuous Infusions:  sodium chloride 10 mL/hr at 10/11/20 0225      LOS: 4 days  Time spent: Greater than 50% of the 35 minute visit was spent in counseling/coordination of care for the patient as laid out  in the A&P.   Dwyane Dee, MD [facility] 10/12/2020, 2:00 PM

## 2020-10-13 DIAGNOSIS — R112 Nausea with vomiting, unspecified: Secondary | ICD-10-CM | POA: Diagnosis not present

## 2020-10-13 LAB — CBC WITH DIFFERENTIAL/PLATELET
Abs Immature Granulocytes: 0.08 10*3/uL — ABNORMAL HIGH (ref 0.00–0.07)
Basophils Absolute: 0.1 10*3/uL (ref 0.0–0.1)
Basophils Relative: 0 %
Eosinophils Absolute: 0.5 10*3/uL (ref 0.0–0.5)
Eosinophils Relative: 4 %
HCT: 23.5 % — ABNORMAL LOW (ref 36.0–46.0)
Hemoglobin: 7.1 g/dL — ABNORMAL LOW (ref 12.0–15.0)
Immature Granulocytes: 1 %
Lymphocytes Relative: 21 %
Lymphs Abs: 2.9 10*3/uL (ref 0.7–4.0)
MCH: 28 pg (ref 26.0–34.0)
MCHC: 30.2 g/dL (ref 30.0–36.0)
MCV: 92.5 fL (ref 80.0–100.0)
Monocytes Absolute: 1 10*3/uL (ref 0.1–1.0)
Monocytes Relative: 7 %
Neutro Abs: 9.3 10*3/uL — ABNORMAL HIGH (ref 1.7–7.7)
Neutrophils Relative %: 67 %
Platelets: 372 10*3/uL (ref 150–400)
RBC: 2.54 MIL/uL — ABNORMAL LOW (ref 3.87–5.11)
RDW: 17.3 % — ABNORMAL HIGH (ref 11.5–15.5)
WBC: 13.8 10*3/uL — ABNORMAL HIGH (ref 4.0–10.5)
nRBC: 0 % (ref 0.0–0.2)

## 2020-10-13 LAB — BASIC METABOLIC PANEL
Anion gap: 8 (ref 5–15)
BUN: 28 mg/dL — ABNORMAL HIGH (ref 6–20)
CO2: 21 mmol/L — ABNORMAL LOW (ref 22–32)
Calcium: 8.6 mg/dL — ABNORMAL LOW (ref 8.9–10.3)
Chloride: 109 mmol/L (ref 98–111)
Creatinine, Ser: 1.7 mg/dL — ABNORMAL HIGH (ref 0.44–1.00)
GFR, Estimated: 35 mL/min — ABNORMAL LOW (ref >60)
Glucose, Bld: 168 mg/dL — ABNORMAL HIGH (ref 70–99)
Potassium: 3.5 mmol/L (ref 3.5–5.1)
Sodium: 138 mmol/L (ref 135–145)

## 2020-10-13 LAB — GLUCOSE, CAPILLARY
Glucose-Capillary: 157 mg/dL — ABNORMAL HIGH (ref 70–99)
Glucose-Capillary: 186 mg/dL — ABNORMAL HIGH (ref 70–99)
Glucose-Capillary: 258 mg/dL — ABNORMAL HIGH (ref 70–99)
Glucose-Capillary: 281 mg/dL — ABNORMAL HIGH (ref 70–99)

## 2020-10-13 LAB — MAGNESIUM: Magnesium: 1.7 mg/dL (ref 1.7–2.4)

## 2020-10-13 MED ORDER — LANTUS SOLOSTAR 100 UNIT/ML ~~LOC~~ SOPN: 40.0000 [IU] | PEN_INJECTOR | Freq: Every day | SUBCUTANEOUS | 0 refills | Status: DC

## 2020-10-13 MED ORDER — NOVOLOG FLEXPEN 100 UNIT/ML ~~LOC~~ SOPN: 1.0000 [IU] | PEN_INJECTOR | Freq: Four times a day (QID) | SUBCUTANEOUS | 1 refills | Status: DC

## 2020-10-13 NOTE — Discharge Instructions (Signed)
Followup with your primary care and have your hemoglobin and kidney function rechecked within 1 week.

## 2020-10-13 NOTE — Discharge Summary (Signed)
Physician Discharge Summary   Deborah [name] JSE:[id number] DOB: September 06, 1965 DOA: 10/07/2020  PCP: Merryl Hacker, No  Admit date: 10/07/2020 Discharge date: 10/13/2020   Admitted From: home Disposition:  home Discharging physician: Dwyane Dee, MD  Recommendations for Outpatient Follow-up:  Adjust insulin regimen as needed Repeat BMP and CBC at follow up   Home Health:  Equipment/Devices:   Patient discharged to home in Discharge Condition: stable Risk of unplanned readmission score: Unplanned Admission- Pilot do not use: 39.98  CODE STATUS: Full Diet recommendation:  Diet Orders (From admission, onward)     Start     Ordered   10/09/20 1039  DIET SOFT Room service appropriate? Yes; Fluid consistency: Thin  Diet effective now       Comments: Carb consistent  Question Answer Comment  Room service appropriate? Yes   Fluid consistency: Thin      10/09/20 1039            Hospital Course: Deborah [name] is a 55 y.o. female with medical history significant for systolic CHF, chronic kidney disease stage IIIb, carcinoid tumor of the duodenum, uncontrolled type 2 diabetes mellitus, gastroparesis, chronic pain, cirrhosis, and paroxysmal atrial fibrillation on Eliquis, now presenting to the emergency department for evaluation of severe abdominal pain, nausea, and vomiting.  Patient reports that she developed abdominal pain over the past day that has become severe and she ran out of her home pain medications.  Last bowel movement was 3 days ago and loose.  She reports chronic diarrhea.  She continues to pass flatus.  She denies fevers or chills.  She denies dysuria or flank pain.  Pain is severe, beginning in the mid abdomen, and radiating towards her chest, similar to pain she has had frequently in the past.  Taking any oral medications including Eliquis for the past 3 or 4 days due to her nausea and vomiting.  Denies melena, hematochezia, or hematemesis.   Upon arrival to the ED,  patient is found to be afebrile, saturating well on room air, slightly tachycardic, and with stable blood pressure.  EKG features sinus tachycardia with rate 110 and QTc interval 502 ms.  Radiographs of the chest and abdomen are negative.  Chemistry panel with potassium 2.6, glucose 218, and creatinine 2.01.  CBC with WBC 15,500, hemoglobin 8.6, and platelets 551,000, all of which appear similar to prior values.  Patient was given a liter of saline, IV PPI, IV labetalol, IV Haldol, Dilaudid, and potassium in the ED. She continues to have severe symptoms and is admitted for further treatment and work-up.  Intractable abdominal pain and N/V  - Patient with hx of chronic abdominal pain and diabetic gastroparesis p/w 3-4 days N/V and worsening abdominal pain   - No acute findings on plain radiographs, symptoms same as prior episodes of gastroparesis  - She has been unable to tolerate Reglan d/t extrapyramidal sxs and treatment complicated by prolonged QT interval  -Continue soft diet; tolerating well prior to discharge for at least 2-3 days    AKI superimposed on CKD IIIb  - SCr is 2.01 on admission, up from 1.64 on July 10th  - She likely has acute prerenal azotemia in setting of hypovolemia  - She was given a liter NS in ED, has hx of CHF  -Discontinue fluids - repeat BMP at follow up   Normocytic anemia -Likely contributed to from underlying CKD - Hemoglobin has down trended to 7 g/dL on 8/5 and she was given 1 unit PRBC.  No overt signs of bleeding - repeat CBC at follow up    Uncontrolled IDDM  - A1c was 10.5% in May 2022; A1c improved to 7.8% on 10/08/20 - Continue CBG checks and insulin   - Lantus was decreased to 40 units total daily and tolerated well    Paroxysmal atrial fibrillation  - In sinus rhythm on admission  - CHADS-VASc 6 (CVA x2, gender, CHF, DM, HTN) -Now that tolerating some oral intake, discontinue heparin drip and resume Eliquis   Chronic systolic CHF  - Appears  hypovolemic in setting of  3-4 days N/V -S/p fluids on admission   Hypokalemia   - secondary to GI-losses    Prolonged QT interval  - QTc is 502 ms in ED  - Replace potassium, check magnesium, minimize QT-prolonging medications   Hypertension - stable   Elevated troponin - Troponin slightly elevated and flat - She has abdominal pain radiating to chest similar to her prior episodes of gastroparesis, no acute ischemic features on EKG, and suspicion for ACS is very low   Leukocytosis Thrombocytosis - Appears to be chronic, no apparent infectious process, will culture if febrile   The patient's chronic medical conditions were treated accordingly per the patient's home medication regimen except as noted.  On day of discharge, patient was felt deemed stable for discharge. Patient/family member advised to call PCP or come back to ER if needed.   Principal Diagnosis: Intractable nausea and vomiting  Discharge Diagnoses: Active Hospital Problems   Diagnosis Date Noted   Intractable nausea and vomiting 12/17/2018   PAF (paroxysmal atrial fibrillation) (HCC)    Prolonged QT interval    Carcinoid tumor of duodenum    Non-alcoholic cirrhosis (HCC)    Thrombocytosis    Uncontrolled type 2 diabetes mellitus with hyperglycemia (HCC)    Chronic systolic CHF (congestive heart failure) ([place]) 12/19/2018   Abdominal pain 07/17/2018   Leukocytosis 11/29/2017   Normocytic anemia 08/16/2016   Diabetic gastroparesis ([place]) 06/05/2016   Hypokalemia 09/26/2015   Chronic kidney disease, stage 3b ([place]) 05/25/2015   Essential hypertension, benign 09/28/2013    Resolved Hospital Problems  No resolved problems to display.    Discharge Instructions     Increase activity slowly   Complete by: As directed       Allergies as of 10/13/2020       Reactions   Diazepam [name]   Gabapentin [name], Swelling   Other reaction(s): Unknown   Iodinated Diagnostic Agents  Anaphylaxis   11/29/17 Cardiac arrest 1 min after IV contrast, possible allergy vs vasovagal episode Iopamidol  Anaphylaxis  High 11/28/2017  Patient had seizure like activity and then code post 100 cc of isovue 300     Isovue [iopamidol] Anaphylaxis   11/28/17 Patient had seizure like activity and then 1 min code after 100 cc of isovue 300. Possible contrast allergy vs vasovagal episode   Lisinopril Anaphylaxis   Tongue and mouth swelling   Metoclopramide Other (See Comments)   Tardive dyskinesia   Nsaids Anaphylaxis, Other (See Comments)   ULCER   Penicillins Palpitations   Has patient had a PCN reaction causing immediate rash, facial/tongue/throat swelling, SOB or lightheadedness with hypotension: Yes, heart races Has patient had a PCN reaction causing severe rash involving mucus membranes or skin necrosis: No Has patient had a PCN reaction that required hospitalization: Yes  Has patient had a PCN reaction occurring within the last 10 years: No   Tolmetin Nausea Only, Other (  See Comments)   ULCER   Aspirin Other (See Comments)   Irritates stomach ulcer    Cyclobenzaprine    Other reaction(s): Unknown   Rifamycins    Acetaminophen Nausea Only, Other (See Comments)   Irritates stomach ulcer; Abdominal pain   Oxycodone Palpitations   Tramadol Nausea And Vomiting        Medication List     TAKE these medications    albuterol (2.5 MG/3ML) 0.083% nebulizer solution Commonly known as: PROVENTIL Take 3 mLs (2.5 mg total) by nebulization every 6 (six) hours as needed for wheezing or shortness of breath.   atorvastatin 10 MG tablet Commonly known as: LIPITOR TAKE ONE TABLET BY MOUTH ONCE DAILY (AM)   blood glucose meter kit and supplies Kit Dispense based on patient and insurance preference. Use up to four times daily as directed. (FOR ICD-9 250.00, 250.01). What changed:  how much to take how to take this when to take this   DULoxetine HCl 40 MG Cpep TAKE ONE CAPSULE BY  MOUTH TWICE DAILY   Easy Comfort Pen Needles 31G X 5 MM Misc Generic drug: Insulin Pen Needle USE 3 TIMES A DAY FOR INSULIN ADMINISTRATION What changed: See the new instructions.   Eliquis 5 MG Tabs tablet Generic drug: apixaban TAKE 1 TABLET BY MOUTH 2 (TWO) TIMES DAILY.   fluticasone 50 MCG/ACT nasal spray Commonly known as: FLONASE Place 2 sprays into both nostrils daily as needed for allergies or rhinitis.   hydrALAZINE 50 MG tablet Commonly known as: APRESOLINE Take 1 tablet (50 mg total) by mouth 3 (three) times daily.   isosorbide mononitrate 60 MG 24 hr tablet Commonly known as: IMDUR TAKE 1 TABLET (60 MG TOTAL) BY MOUTH DAILY.   Lantus SoloStar 100 UNIT/ML Solostar Pen Generic drug: insulin glargine Inject 40 Units into the skin daily. What changed:  how much to take when to take this   melatonin 3 MG Tabs tablet TAKE 2 TABLETS (6 MG TOTAL) BY MOUTH AT BEDTIME.   metoprolol succinate 50 MG 24 hr tablet Commonly known as: TOPROL-XL Take 1 tablet (50 mg total) by mouth at bedtime.   nitroGLYCERIN 0.4 MG SL tablet Commonly known as: NITROSTAT Place 1 tablet (0.4 mg total) under the tongue every 5 (five) minutes as needed for chest pain.   NovoLOG FlexPen 100 UNIT/ML FlexPen Generic drug: insulin aspart Inject 1-15 Units into the skin in the morning, at noon, in the evening, and at bedtime. CBG 121 - 150: 2 units, CBG 151 - 200: 3 units, CBG 201 - 250: 5 units, CBG 251 - 300: 8 units, CBG 301 - 350: 11 units, CBG 351 - 400: 15 units, CBG > 400 call MD What changed:  how much to take additional instructions   ondansetron 4 MG disintegrating tablet Commonly known as: Zofran ODT Take 1 tablet (4 mg total) by mouth every 8 (eight) hours as needed for nausea or vomiting.   pantoprazole 40 MG tablet Commonly known as: PROTONIX Take 1 tablet (40 mg total) by mouth 2 (two) times daily.   promethazine 12.5 MG tablet Commonly known as: PHENERGAN Take 1 tablet  (12.5 mg total) by mouth every 6 (six) hours as needed for nausea or vomiting.   spironolactone 25 MG tablet Commonly known as: ALDACTONE Take 12.5 mg by mouth daily.   torsemide 20 MG tablet Commonly known as: DEMADEX Take 2 tablets (40 mg total) by mouth daily.        Follow-up Information  [facility]. Go on 10/24/2020.   Specialty: Physical Medicine and Rehabilitation Contact information: [address], [place] [address] [id number] [place] [phone]        [facility] Go to.   Why: Dr Karle Plumber office will be calling you with an appointment between the hours of 12:30 and 2:00 at their earliest opening. Contact information: [address], [place], [place] [id number] Phone: [phone]               Allergies  Allergen Reactions   Diazepam [name]   Gabapentin [name] and Swelling    Other reaction(s): Unknown   Iodinated Diagnostic Agents Anaphylaxis    11/29/17 Cardiac arrest 1 min after IV contrast, possible allergy vs vasovagal episode Iopamidol  Anaphylaxis  High 11/28/2017  Patient had seizure like activity and then code post 100 cc of isovue 300     Isovue [Iopamidol] Anaphylaxis    11/28/17 Patient had seizure like activity and then 1 min code after 100 cc of isovue 300. Possible contrast allergy vs vasovagal episode   Lisinopril Anaphylaxis    Tongue and mouth swelling   Metoclopramide Other (See Comments)    Tardive dyskinesia   Nsaids Anaphylaxis and Other (See Comments)    ULCER   Penicillins Palpitations    Has patient had a PCN reaction causing immediate rash, facial/tongue/throat swelling, SOB or lightheadedness with hypotension: Yes, heart races Has patient had a PCN reaction causing severe rash involving mucus membranes or skin necrosis: No Has patient had a PCN reaction that required hospitalization: Yes  Has  patient had a PCN reaction occurring within the last 10 years: No    Tolmetin Nausea Only and Other (See Comments)    ULCER   Aspirin Other (See Comments)    Irritates stomach ulcer    Cyclobenzaprine     Other reaction(s): Unknown   Rifamycins    Acetaminophen Nausea Only and Other (See Comments)    Irritates stomach ulcer; Abdominal pain   Oxycodone Palpitations   Tramadol Nausea And Vomiting    Consultations:   Discharge Exam: BP 139/74 (BP Location: Right Wrist)   Pulse 98   Temp 98 F (36.7 C) (Oral)   Resp 19   Ht '5\' 6"'  (1.676 m)   Wt (!) 143.5 kg   LMP 10/10/2012   SpO2 96%   BMI 51.06 kg/m  General appearance: alert, cooperative, no distress, and morbidly obese Head: Normocephalic, without obvious abnormality, atraumatic Eyes:  EOMI Lungs: clear to auscultation bilaterally Heart: regular rate and rhythm and S1, S2 normal Abdomen:  Obese, soft, bowel sounds present Extremities:  Obese, LE edema Skin: mobility and turgor normal Neurologic: Grossly normal  The results of significant diagnostics from this hospitalization (including imaging, microbiology, ancillary and laboratory) are listed below for reference.   Microbiology: Recent Results (from the past 240 hour(s))  Resp Panel by RT-PCR (Flu A&B, Covid) Nasopharyngeal Swab     Status: None   Collection Time: 10/07/20  6:52 PM   Specimen: Nasopharyngeal Swab; Nasopharyngeal(NP) swabs in vial transport medium  Result Value Ref Range Status   SARS Coronavirus 2 by RT PCR NEGATIVE NEGATIVE Final    Comment: (NOTE) SARS-CoV-2 target nucleic acids are NOT DETECTED.  The SARS-CoV-2 RNA is generally detectable in upper respiratory specimens during the acute phase of infection. The lowest concentration of SARS-CoV-2 viral copies this assay can detect is 138 copies/mL. A negative result  does not preclude SARS-Cov-2 infection and should not be used as the sole basis for treatment or other patient management  decisions. A negative result may occur with  improper specimen collection/handling, submission of specimen other than nasopharyngeal swab, presence of viral mutation(s) within the areas targeted by this assay, and inadequate number of viral copies(<138 copies/mL). A negative result must be combined with clinical observations, patient history, and epidemiological information. The expected result is Negative.  Fact Sheet for Patients:  EntrepreneurPulse.com.au  Fact Sheet for Healthcare Providers:  IncredibleEmployment.be  This test is no t yet approved or cleared by the [place] FDA and  has been authorized for detection and/or diagnosis of SARS-CoV-2 by FDA under an Emergency Use Authorization (EUA). This EUA will remain  in effect (meaning this test can be used) for the duration of the COVID-19 declaration under Section 564(b)(1) of the Act, 21 U.S.C.section 360bbb-3(b)(1), unless the authorization is terminated  or revoked sooner.       Influenza A by PCR NEGATIVE NEGATIVE Final   Influenza B by PCR NEGATIVE NEGATIVE Final    Comment: (NOTE) The Xpert Xpress SARS-CoV-2/FLU/RSV plus assay is intended as an aid in the diagnosis of influenza from Nasopharyngeal swab specimens and should not be used as a sole basis for treatment. Nasal washings and aspirates are unacceptable for Xpert Xpress SARS-CoV-2/FLU/RSV testing.  Fact Sheet for Patients: EntrepreneurPulse.com.au  Fact Sheet for Healthcare Providers: IncredibleEmployment.be  This test is not yet approved or cleared by the [place] FDA and has been authorized for detection and/or diagnosis of SARS-CoV-2 by FDA under an Emergency Use Authorization (EUA). This EUA will remain in effect (meaning this test can be used) for the duration of the COVID-19 declaration under Section 564(b)(1) of the Act, 21 U.S.C. section 360bbb-3(b)(1), unless the  authorization is terminated or revoked.  Performed at [facility], [place] [address], [place], [place] [address]      Labs: BNP (last 3 results) Recent Labs    01/23/20 1315 05/25/20 1102 08/28/20 0937  BNP 165.0* 51.2 82.5   Basic Metabolic Panel: Recent Labs  Lab 10/09/20 0825 10/10/20 0325 10/11/20 0345 10/12/20 0526 10/13/20 0548  NA 140 138 134* 135 138  K 3.0* 3.1* 3.7 3.4* 3.5  CL 110 108 106 109 109  CO2 21* 21* 21* 22 21*  GLUCOSE 195* 260* 309* 231* 168*  BUN 14 17 22* 24* 28*  CREATININE 1.51* 2.16* 1.76* 1.70* 1.70*  CALCIUM 8.3* 8.5* 8.6* 8.3* 8.6*  MG 2.0 1.9 2.1 1.8 1.7   Liver Function Tests: Recent Labs  Lab 10/07/20 1240 10/08/20 0240  AST 15 22  ALT 11 11  ALKPHOS 101 95  BILITOT 0.7 1.2  PROT 8.5* 7.9  ALBUMIN 3.6 3.1*   Recent Labs  Lab 10/07/20 1240  LIPASE 28   No results for input(s): AMMONIA in the last 168 hours. CBC: Recent Labs  Lab 10/09/20 0825 10/10/20 0325 10/11/20 0345 10/12/20 0526 10/13/20 0548  WBC 14.7* 12.6* 14.0* 13.1* 13.8*  NEUTROABS 10.8* 8.7* 10.1* 9.5* 9.3*  HGB 7.3* 7.0* 8.2* 7.2* 7.1*  HCT 24.0* 23.5* 27.4* 23.8* 23.5*  MCV 92.0 95.1 92.9 93.0 92.5  PLT 418* 376 388 357 372   Cardiac Enzymes: No results for input(s): CKTOTAL, CKMB, CKMBINDEX, TROPONINI in the last 168 hours. BNP: Invalid input(s): POCBNP CBG: Recent Labs  Lab 10/12/20 2013 10/13/20 0011 10/13/20 0438 10/13/20 0743 10/13/20 1142  GLUCAP 347* 281* 157* 186* 258*   D-Dimer No  results for input(s): DDIMER in the last 72 hours. Hgb A1c No results for input(s): HGBA1C in the last 72 hours. Lipid Profile No results for input(s): CHOL, HDL, LDLCALC, TRIG, CHOLHDL, LDLDIRECT in the last 72 hours. Thyroid function studies No results for input(s): TSH, T4TOTAL, T3FREE, THYROIDAB in the last 72 hours.  Invalid input(s): FREET3 Anemia work up No results for input(s): VITAMINB12, FOLATE, FERRITIN, TIBC, IRON,  RETICCTPCT in the last 72 hours. Urinalysis    Component Value Date/Time   COLORURINE STRAW (A) 10/07/2020 1240   APPEARANCEUR CLEAR 10/07/2020 1240   LABSPEC 1.006 10/07/2020 1240   PHURINE 6.0 10/07/2020 1240   GLUCOSEU 50 (A) 10/07/2020 1240   HGBUR SMALL (A) 10/07/2020 1240   BILIRUBINUR NEGATIVE 10/07/2020 1240   KETONESUR NEGATIVE 10/07/2020 1240   PROTEINUR 100 (A) 10/07/2020 1240   UROBILINOGEN 0.2 10/02/2013 2108   NITRITE NEGATIVE 10/07/2020 1240   LEUKOCYTESUR NEGATIVE 10/07/2020 1240   Sepsis Labs Invalid input(s): PROCALCITONIN,  WBC,  LACTICIDVEN Microbiology Recent Results (from the past 240 hour(s))  Resp Panel by RT-PCR (Flu A&B, Covid) Nasopharyngeal Swab     Status: None   Collection Time: 10/07/20  6:52 PM   Specimen: Nasopharyngeal Swab; Nasopharyngeal(NP) swabs in vial transport medium  Result Value Ref Range Status   SARS Coronavirus 2 by RT PCR NEGATIVE NEGATIVE Final    Comment: (NOTE) SARS-CoV-2 target nucleic acids are NOT DETECTED.  The SARS-CoV-2 RNA is generally detectable in upper respiratory specimens during the acute phase of infection. The lowest concentration of SARS-CoV-2 viral copies this assay can detect is 138 copies/mL. A negative result does not preclude SARS-Cov-2 infection and should not be used as the sole basis for treatment or other patient management decisions. A negative result may occur with  improper specimen collection/handling, submission of specimen other than nasopharyngeal swab, presence of viral mutation(s) within the areas targeted by this assay, and inadequate number of viral copies(<138 copies/mL). A negative result must be combined with clinical observations, patient history, and epidemiological information. The expected result is Negative.  Fact Sheet for Patients:  EntrepreneurPulse.com.au  Fact Sheet for Healthcare Providers:  IncredibleEmployment.be  This test is no t yet  approved or cleared by the [place] FDA and  has been authorized for detection and/or diagnosis of SARS-CoV-2 by FDA under an Emergency Use Authorization (EUA). This EUA will remain  in effect (meaning this test can be used) for the duration of the COVID-19 declaration under Section 564(b)(1) of the Act, 21 U.S.C.section 360bbb-3(b)(1), unless the authorization is terminated  or revoked sooner.       Influenza A by PCR NEGATIVE NEGATIVE Final   Influenza B by PCR NEGATIVE NEGATIVE Final    Comment: (NOTE) The Xpert Xpress SARS-CoV-2/FLU/RSV plus assay is intended as an aid in the diagnosis of influenza from Nasopharyngeal swab specimens and should not be used as a sole basis for treatment. Nasal washings and aspirates are unacceptable for Xpert Xpress SARS-CoV-2/FLU/RSV testing.  Fact Sheet for Patients: EntrepreneurPulse.com.au  Fact Sheet for Healthcare Providers: IncredibleEmployment.be  This test is not yet approved or cleared by the [place] FDA and has been authorized for detection and/or diagnosis of SARS-CoV-2 by FDA under an Emergency Use Authorization (EUA). This EUA will remain in effect (meaning this test can be used) for the duration of the COVID-19 declaration under Section 564(b)(1) of the Act, 21 U.S.C. section 360bbb-3(b)(1), unless the authorization is terminated or revoked.  Performed at [facility], [place]  [address] [place], [place] [address]     Procedures/Studies: CT ABDOMEN PELVIS WO CONTRAST  Result Date: 09/14/2020 CLINICAL DATA:  55 year old female with acute abdominal and pelvic pain with nausea and vomiting. History of gastric cancer. EXAM: CT ABDOMEN AND PELVIS WITHOUT CONTRAST TECHNIQUE: Multidetector CT imaging of the abdomen and pelvis was performed following the standard protocol without IV contrast. COMPARISON:  08/28/2020 and prior CTs FINDINGS: Please note that parenchymal  abnormalities may be missed without intravenous contrast. Lower chest: No acute abnormality Hepatobiliary: The liver is unremarkable. The patient is status post cholecystectomy. No biliary dilatation. Pancreas: Unremarkable Spleen: Unremarkable Adrenals/Urinary Tract: The kidneys, adrenal glands and bladder are unremarkable. Stomach/Bowel: Stomach is within normal limits. Appendix appears normal. No evidence of bowel wall thickening, distention, or inflammatory changes. Vascular/Lymphatic: Aortic atherosclerosis. No enlarged abdominal or pelvic lymph nodes. Reproductive: A 4.7 cm LEFT pedunculated uterine fibroid is again noted. No other significant abnormalities. Other: No ascites, focal collection or pneumoperitoneum. Musculoskeletal: No acute or suspicious bony abnormalities identified. IMPRESSION: 1. No evidence of acute abnormality. 2. Unchanged 4.7 cm LEFT uterine fibroid. 3. Aortic Atherosclerosis (ICD10-I70.0). Electronically Signed   By: Margarette Canada M.D.   On: 09/14/2020 13:16   DG Chest Portable 1 View  Result Date: 09/14/2020 CLINICAL DATA:  Abdominal pain with nausea and vomiting since 9 a.m. Gastric cancer. EXAM: PORTABLE CHEST 1 VIEW COMPARISON:  08/28/2020 FINDINGS: Midline trachea. Normal heart size for level of inspiration. No pleural effusion or pneumothorax. Clear lungs. IMPRESSION: No acute cardiopulmonary disease. Electronically Signed   By: Abigail Miyamoto M.D.   On: 09/14/2020 15:42   DG Abd Acute W/Chest  Result Date: 10/07/2020 CLINICAL DATA:  Abdominal and chest pain for 3 hours. EXAM: DG ABDOMEN ACUTE WITH 1 VIEW CHEST COMPARISON:  Single-view of the chest and CT abdomen and pelvis 09/14/2020. FINDINGS: Single-view of the chest demonstrates clear lungs. Heart size is normal. No pneumothorax or pleural fluid. No acute or focal bony abnormality. Two views of the abdomen show no free intraperitoneal air. The bowel gas pattern is nonobstructive. No acute or focal bony abnormality.  IMPRESSION: Negative exam. Electronically Signed   By: Inge Rise M.D.   On: 10/07/2020 14:04     Time coordinating discharge: Over 30 minutes    Dwyane Dee, MD  [facility] 10/13/2020, 2:31 PM

## 2020-10-13 NOTE — TOC Transition Note (Signed)
Transition of Care [facility]) - CM/SW Discharge Note   Patient Details  Name: Deborah [name] MRN: [id number] Date of Birth: Mar 12, 1965  Transition of Care [facility] CM/SW Contact:  Trish Mage, LCSW Phone Number: 10/13/2020, 11:14 AM   Clinical Narrative:   Patient who is stable for d/c will return home, follow up with Dr Benjie Karvonen at [facility] for PCP, and at [facility] for pain management.  She has PCS aide in the home 6 days a week for 2.5 hours daily, 12-2:30. She has an appointment at the pain clinic on the 19th, and I called to request an apointment with her PCP for ASAP.  They will call her back with appointment time and date.  No further needs identified. TOC will continue to follow during the course of hospitalization.     Final next level of care: Home/Self Care Barriers to Discharge: No Barriers Identified   Patient Goals and CMS Choice        Discharge Placement                       Discharge Plan and Services                                     Social Determinants of Health (SDOH) Interventions     Readmission Risk Interventions Readmission Risk Prevention Plan 07/02/2019 06/25/2019 01/29/2019  Transportation Screening Complete Complete Complete  PCP or Specialist Appt within 3-5 Days - - Complete  Not Complete comments - - -  [place] or [place] - - Complete  Social Work Consult for [place] Planning/Counseling - - Complete  Palliative Care Screening - - Not Applicable  Medication Review Press photographer) Complete Complete Complete  PCP or Specialist appointment within 3-5 days of discharge - Complete -  [place] or Home Care Consult Complete - -  SW Recovery Care/Counseling Consult Complete Complete -  Palliative Care Screening Not Applicable Not Applicable -  [place] Not Applicable Not Applicable -  Some recent data might be hidden

## 2020-10-13 NOTE — Consult Note (Signed)
[facility] Inpatient Consult   10/13/2020  JENE HUQ 1965-05-27 [id number]  [facility] [[facility]] Patient: CMS DCE   Patient's chart reviewed due to extreme unplanned readmission risk score for potential [facility] Care Management post hospital follow up needs. Per review, patient to follow up with PCP outside of [place] physician network. No [facility] CM follow up.  Of note, [facility] Care Management services does not replace or interfere with any services that are arranged by inpatient case management or social work.   Netta Cedars, MSN, [place] [facility] Liaison Nurse Mobile Phone [phone]  Toll free office [phone]

## 2020-10-21 ENCOUNTER — Other Ambulatory Visit: Payer: Self-pay | Admitting: Family Medicine

## 2020-10-22 ENCOUNTER — Encounter (HOSPITAL_COMMUNITY): Payer: Self-pay | Admitting: Emergency Medicine

## 2020-10-22 ENCOUNTER — Inpatient Hospital Stay (HOSPITAL_COMMUNITY)
Admission: EM | Admit: 2020-10-22 | Discharge: 2020-10-26 | DRG: 074 | Disposition: A | Discharge status: Home / Self Care | Payer: Medicare Other | Attending: Family Medicine | Admitting: Family Medicine

## 2020-10-22 ENCOUNTER — Emergency Department (HOSPITAL_COMMUNITY): Payer: Medicare Other

## 2020-10-22 DIAGNOSIS — Z880 Allergy status to penicillin: Secondary | ICD-10-CM

## 2020-10-22 DIAGNOSIS — D72829 Elevated white blood cell count, unspecified: Secondary | ICD-10-CM | POA: Diagnosis present

## 2020-10-22 DIAGNOSIS — E785 Hyperlipidemia, unspecified: Secondary | ICD-10-CM | POA: Diagnosis present

## 2020-10-22 DIAGNOSIS — E1165 Type 2 diabetes mellitus with hyperglycemia: Secondary | ICD-10-CM | POA: Diagnosis present

## 2020-10-22 DIAGNOSIS — D490 Neoplasm of unspecified behavior of digestive system: Secondary | ICD-10-CM | POA: Diagnosis present

## 2020-10-22 DIAGNOSIS — E1122 Type 2 diabetes mellitus with diabetic chronic kidney disease: Secondary | ICD-10-CM | POA: Diagnosis present

## 2020-10-22 DIAGNOSIS — I480 Paroxysmal atrial fibrillation: Secondary | ICD-10-CM | POA: Diagnosis present

## 2020-10-22 DIAGNOSIS — G8929 Other chronic pain: Secondary | ICD-10-CM | POA: Diagnosis present

## 2020-10-22 DIAGNOSIS — E872 Acidosis: Secondary | ICD-10-CM | POA: Diagnosis present

## 2020-10-22 DIAGNOSIS — E1143 Type 2 diabetes mellitus with diabetic autonomic (poly)neuropathy: Secondary | ICD-10-CM | POA: Diagnosis not present

## 2020-10-22 DIAGNOSIS — Z888 Allergy status to other drugs, medicaments and biological substances status: Secondary | ICD-10-CM

## 2020-10-22 DIAGNOSIS — K7460 Unspecified cirrhosis of liver: Secondary | ICD-10-CM | POA: Diagnosis present

## 2020-10-22 DIAGNOSIS — R109 Unspecified abdominal pain: Secondary | ICD-10-CM | POA: Diagnosis not present

## 2020-10-22 DIAGNOSIS — Z886 Allergy status to analgesic agent status: Secondary | ICD-10-CM

## 2020-10-22 DIAGNOSIS — I130 Hypertensive heart and chronic kidney disease with heart failure and stage 1 through stage 4 chronic kidney disease, or unspecified chronic kidney disease: Secondary | ICD-10-CM | POA: Diagnosis present

## 2020-10-22 DIAGNOSIS — M797 Fibromyalgia: Secondary | ICD-10-CM | POA: Diagnosis present

## 2020-10-22 DIAGNOSIS — IMO0002 Reserved for concepts with insufficient information to code with codable children: Secondary | ICD-10-CM

## 2020-10-22 DIAGNOSIS — Z794 Long term (current) use of insulin: Secondary | ICD-10-CM

## 2020-10-22 DIAGNOSIS — R112 Nausea with vomiting, unspecified: Secondary | ICD-10-CM | POA: Diagnosis present

## 2020-10-22 DIAGNOSIS — Z20822 Contact with and (suspected) exposure to covid-19: Secondary | ICD-10-CM | POA: Diagnosis present

## 2020-10-22 DIAGNOSIS — I5022 Chronic systolic (congestive) heart failure: Secondary | ICD-10-CM | POA: Diagnosis present

## 2020-10-22 DIAGNOSIS — E1140 Type 2 diabetes mellitus with diabetic neuropathy, unspecified: Secondary | ICD-10-CM

## 2020-10-22 DIAGNOSIS — E861 Hypovolemia: Secondary | ICD-10-CM | POA: Diagnosis present

## 2020-10-22 DIAGNOSIS — Z8673 Personal history of transient ischemic attack (TIA), and cerebral infarction without residual deficits: Secondary | ICD-10-CM

## 2020-10-22 DIAGNOSIS — D631 Anemia in chronic kidney disease: Secondary | ICD-10-CM | POA: Diagnosis present

## 2020-10-22 DIAGNOSIS — I10 Essential (primary) hypertension: Secondary | ICD-10-CM | POA: Diagnosis present

## 2020-10-22 DIAGNOSIS — M109 Gout, unspecified: Secondary | ICD-10-CM | POA: Diagnosis present

## 2020-10-22 DIAGNOSIS — Z8249 Family history of ischemic heart disease and other diseases of the circulatory system: Secondary | ICD-10-CM

## 2020-10-22 DIAGNOSIS — I428 Other cardiomyopathies: Secondary | ICD-10-CM | POA: Diagnosis present

## 2020-10-22 DIAGNOSIS — E119 Type 2 diabetes mellitus without complications: Secondary | ICD-10-CM

## 2020-10-22 DIAGNOSIS — Z833 Family history of diabetes mellitus: Secondary | ICD-10-CM

## 2020-10-22 DIAGNOSIS — N1832 Chronic kidney disease, stage 3b: Secondary | ICD-10-CM | POA: Diagnosis present

## 2020-10-22 DIAGNOSIS — N184 Chronic kidney disease, stage 4 (severe): Secondary | ICD-10-CM | POA: Diagnosis present

## 2020-10-22 DIAGNOSIS — D75839 Thrombocytosis, unspecified: Secondary | ICD-10-CM | POA: Diagnosis present

## 2020-10-22 DIAGNOSIS — Z9049 Acquired absence of other specified parts of digestive tract: Secondary | ICD-10-CM

## 2020-10-22 DIAGNOSIS — Z91041 Radiographic dye allergy status: Secondary | ICD-10-CM

## 2020-10-22 DIAGNOSIS — Z8711 Personal history of peptic ulcer disease: Secondary | ICD-10-CM

## 2020-10-22 DIAGNOSIS — Z885 Allergy status to narcotic agent status: Secondary | ICD-10-CM

## 2020-10-22 DIAGNOSIS — Z7901 Long term (current) use of anticoagulants: Secondary | ICD-10-CM

## 2020-10-22 DIAGNOSIS — E876 Hypokalemia: Secondary | ICD-10-CM | POA: Diagnosis present

## 2020-10-22 DIAGNOSIS — K3184 Gastroparesis: Secondary | ICD-10-CM | POA: Diagnosis present

## 2020-10-22 LAB — CBC WITH DIFFERENTIAL/PLATELET
Abs Immature Granulocytes: 0.08 10*3/uL — ABNORMAL HIGH (ref 0.00–0.07)
Basophils Absolute: 0.1 10*3/uL (ref 0.0–0.1)
Basophils Relative: 1 %
Eosinophils Absolute: 0.3 10*3/uL (ref 0.0–0.5)
Eosinophils Relative: 2 %
HCT: 29.6 % — ABNORMAL LOW (ref 36.0–46.0)
Hemoglobin: 9 g/dL — ABNORMAL LOW (ref 12.0–15.0)
Immature Granulocytes: 1 %
Lymphocytes Relative: 20 %
Lymphs Abs: 2.6 10*3/uL (ref 0.7–4.0)
MCH: 27.5 pg (ref 26.0–34.0)
MCHC: 30.4 g/dL (ref 30.0–36.0)
MCV: 90.5 fL (ref 80.0–100.0)
Monocytes Absolute: 0.7 10*3/uL (ref 0.1–1.0)
Monocytes Relative: 6 %
Neutro Abs: 9.1 10*3/uL — ABNORMAL HIGH (ref 1.7–7.7)
Neutrophils Relative %: 70 %
Platelets: 530 10*3/uL — ABNORMAL HIGH (ref 150–400)
RBC: 3.27 MIL/uL — ABNORMAL LOW (ref 3.87–5.11)
RDW: 16.7 % — ABNORMAL HIGH (ref 11.5–15.5)
WBC: 12.8 10*3/uL — ABNORMAL HIGH (ref 4.0–10.5)
nRBC: 0 % (ref 0.0–0.2)

## 2020-10-22 LAB — COMPREHENSIVE METABOLIC PANEL
ALT: 11 U/L (ref 0–44)
AST: 18 U/L (ref 15–41)
Albumin: 3.3 g/dL — ABNORMAL LOW (ref 3.5–5.0)
Alkaline Phosphatase: 97 U/L (ref 38–126)
Anion gap: 12 (ref 5–15)
BUN: 19 mg/dL (ref 6–20)
CO2: 19 mmol/L — ABNORMAL LOW (ref 22–32)
Calcium: 8.7 mg/dL — ABNORMAL LOW (ref 8.9–10.3)
Chloride: 109 mmol/L (ref 98–111)
Creatinine, Ser: 1.37 mg/dL — ABNORMAL HIGH (ref 0.44–1.00)
GFR, Estimated: 45 mL/min — ABNORMAL LOW (ref >60)
Glucose, Bld: 82 mg/dL (ref 70–99)
Potassium: 3.1 mmol/L — ABNORMAL LOW (ref 3.5–5.1)
Sodium: 140 mmol/L (ref 135–145)
Total Bilirubin: 0.3 mg/dL (ref 0.3–1.2)
Total Protein: 8.6 g/dL — ABNORMAL HIGH (ref 6.5–8.1)

## 2020-10-22 LAB — URINALYSIS, ROUTINE W REFLEX MICROSCOPIC
Bilirubin Urine: NEGATIVE
Glucose, UA: 150 mg/dL — AB
Hgb urine dipstick: NEGATIVE
Ketones, ur: NEGATIVE mg/dL
Leukocytes,Ua: NEGATIVE
Nitrite: NEGATIVE
Protein, ur: 100 mg/dL — AB
Specific Gravity, Urine: 1.01 (ref 1.005–1.030)
pH: 6 (ref 5.0–8.0)

## 2020-10-22 LAB — CBG MONITORING, ED: Glucose-Capillary: 86 mg/dL (ref 70–99)

## 2020-10-22 LAB — LIPASE, BLOOD: Lipase: 28 U/L (ref 11–51)

## 2020-10-22 MED ORDER — ONDANSETRON HCL 4 MG/2ML IJ SOLN
4.0000 mg | Freq: Once | INTRAMUSCULAR | Status: AC
  Administered 2020-10-22: 4 mg via INTRAVENOUS
  Filled 2020-10-22: qty 2

## 2020-10-22 MED ORDER — HYDROMORPHONE HCL 1 MG/ML IJ SOLN
1.0000 mg | Freq: Once | INTRAMUSCULAR | Status: AC
  Administered 2020-10-22: 1 mg via INTRAVENOUS
  Filled 2020-10-22: qty 1

## 2020-10-22 MED ORDER — SODIUM CHLORIDE 0.9 % IV BOLUS
1000.0000 mL | Freq: Once | INTRAVENOUS | Status: AC
  Administered 2020-10-22: 1000 mL via INTRAVENOUS

## 2020-10-22 NOTE — ED Triage Notes (Signed)
Per EMS, from home, generalized abdominal pain with N/V today. Hx gastroparesis.

## 2020-10-22 NOTE — H&P (Signed)
History and Physical    Deborah [name] IHK:[id number] DOB: [birth year] DOA: 10/22/2020  PCP: Cena Benton, MD Patient coming from: Home  Chief Complaint: Generalized abdominal pain, nausea, vomiting  HPI: Deborah [name] is a 54 y.o. female with medical history significant of paroxysmal A. fib on Eliquis, chronic systolic CHF, CKD stage IIIb, carcinoid tumor of the duodenum, insulin-dependent type 2 diabetes, gastroparesis, chronic pain, cirrhosis, hypertension, hyperlipidemia, stroke, gout, esophagitis, PUD presenting to the ED with complaints of generalized abdominal pain, nausea, and vomiting.  In the ED, vital signs stable on arrival.  Labs showing WBC 12.8 (chronically elevated and stable), hemoglobin 9.0 (chronically low and stable), platelet count 530k (chronically elevated).  Sodium 140, potassium 3.1, chloride 109, bicarb 19, anion gap 12, BUN 19, creatinine 1.3 (improved compared to prior labs), glucose 82.  Lipase and LFTs normal.  UA not suggestive of infection.  COVID and influenza PCR pending.  Given high risk IV contrast allergy, CT abdomen pelvis without contrast done and negative for acute finding. She was given 1 L normal saline bolus and multiple rounds of Dilaudid and Zofran.  Patient reports 3 to 4-day history of nausea, vomiting, and generalized abdominal pain.  States she has not been able to eat anything or take her medications since her symptoms started.  Denies fevers.  Reports history of chronic diarrhea, no recent change.  Review of Systems:  All systems reviewed and apart from history of presenting illness, are negative.  Past Medical History:  Diagnosis Date   Acute back pain with sciatica, left    Acute back pain with sciatica, right    AKI (acute kidney injury) ([place])    Anemia, unspecified    Cancer (HCC)    Carcinoid tumor of duodenum    Chronic pain    Chronic systolic CHF (congestive heart failure) (HCC)    Diabetes mellitus     Esophageal reflux    Fibromyalgia    Gastric ulcer    Gastroparesis    Gout    Hyperlipidemia    Hypertension    Lumbosacral stenosis    NICM (nonischemic cardiomyopathy) (HCC)    Obesity    PAF (paroxysmal atrial fibrillation) ([place])    Stroke ([place]) 02/2011   Vitamin B12 deficiency anemia     Past Surgical History:  Procedure Laterality Date   BIOPSY  07/27/2019   Procedure: BIOPSY;  Surgeon: Clarene Essex, MD;  Location: [facility];  Service: Endoscopy;;   BIOPSY  07/30/2019   Procedure: BIOPSY;  Surgeon: Otis Brace, MD;  Location: [facility];  Service: Gastroenterology;;   CATARACT EXTRACTION  01/2014   CHOLECYSTECTOMY     COLONOSCOPY WITH PROPOFOL N/A 07/30/2019   Procedure: COLONOSCOPY WITH PROPOFOL;  Surgeon: Otis Brace, MD;  Location: [facility];  Service: Gastroenterology;  Laterality: N/A;   ESOPHAGOGASTRODUODENOSCOPY N/A 07/27/2019   Procedure: ESOPHAGOGASTRODUODENOSCOPY (EGD);  Surgeon: Clarene Essex, MD;  Location: [facility];  Service: Endoscopy;  Laterality: N/A;   ESOPHAGOGASTRODUODENOSCOPY N/A 07/26/2020   Procedure: ESOPHAGOGASTRODUODENOSCOPY (EGD);  Surgeon: Arta Silence, MD;  Location: [facility];  Service: Endoscopy;  Laterality: N/A;   ESOPHAGOGASTRODUODENOSCOPY (EGD) WITH PROPOFOL N/A 08/02/2019   Procedure: ESOPHAGOGASTRODUODENOSCOPY (EGD) WITH PROPOFOL;  Surgeon: Otis Brace, MD;  Location: [facility];  Service: Gastroenterology;  Laterality: N/A;   HEMOSTASIS CLIP PLACEMENT  08/02/2019   Procedure: HEMOSTASIS CLIP PLACEMENT;  Surgeon: Otis Brace, MD;  Location: [facility];  Service: Gastroenterology;;   POLYPECTOMY  07/30/2019   Procedure: POLYPECTOMY;  Surgeon: Otis Brace, MD;  Location: [facility];  Service: Gastroenterology;;   POLYPECTOMY  08/02/2019   Procedure: POLYPECTOMY;  Surgeon: Otis Brace, MD;  Location: [facility];  Service: Gastroenterology;;     reports that she has never smoked. She has  never used smokeless tobacco. She reports that she does not drink alcohol and does not use drugs.  Allergies  Allergen Reactions   Diazepam [name]   Gabapentin [name] and Swelling    Other reaction(s): Unknown   Iodinated Diagnostic Agents Anaphylaxis    11/29/17 Cardiac arrest 1 min after IV contrast, possible allergy vs vasovagal episode Iopamidol  Anaphylaxis  High 11/28/2017  Patient had seizure like activity and then code post 100 cc of isovue 300     Isovue [Iopamidol] Anaphylaxis    11/28/17 Patient had seizure like activity and then 1 min code after 100 cc of isovue 300. Possible contrast allergy vs vasovagal episode   Lisinopril Anaphylaxis    Tongue and mouth swelling   Metoclopramide Other (See Comments)    Tardive dyskinesia   Nsaids Anaphylaxis and Other (See Comments)    ULCER   Penicillins Palpitations    Has patient had a PCN reaction causing immediate rash, facial/tongue/throat swelling, SOB or lightheadedness with hypotension: Yes, heart races Has patient had a PCN reaction causing severe rash involving mucus membranes or skin necrosis: No Has patient had a PCN reaction that required hospitalization: Yes  Has patient had a PCN reaction occurring within the last 10 years: No    Tolmetin Nausea Only and Other (See Comments)    ULCER   Aspirin Other (See Comments)    Irritates stomach ulcer    Cyclobenzaprine     Other reaction(s): Unknown   Rifamycins    Acetaminophen Nausea Only and Other (See Comments)    Irritates stomach ulcer; Abdominal pain   Oxycodone Palpitations   Tramadol Nausea And Vomiting    Family History  Problem Relation Age of Onset   Diabetes Mother    Diabetes Father    Heart disease Father    Diabetes Sister    Congestive Heart Failure Sister 3   Diabetes Brother     Prior to Admission medications   Medication Sig Start Date End Date Taking? Authorizing Provider  albuterol (PROVENTIL) (2.5 MG/3ML) 0.083%  nebulizer solution Take 3 mLs (2.5 mg total) by nebulization every 6 (six) hours as needed for wheezing or shortness of breath. 04/06/19  Yes Scot Jun, FNP  atorvastatin (LIPITOR) 10 MG tablet TAKE ONE TABLET BY MOUTH ONCE DAILY (AM) 10/22/20  Yes Alcus Dad, MD  cetirizine (ZYRTEC) 10 MG tablet Take 10 mg by mouth daily. 10/21/20  Yes [provider]  DULoxetine HCl 40 MG CPEP TAKE ONE CAPSULE BY MOUTH TWICE DAILY 10/22/20  Yes Alcus Dad, MD  fluticasone (FLONASE) 50 MCG/ACT nasal spray Place 2 sprays into both nostrils daily as needed for allergies or rhinitis. 12/19/18  Yes Rai, Ripudeep K, MD  hydrALAZINE (APRESOLINE) 50 MG tablet Take 1 tablet (50 mg total) by mouth 3 (three) times daily. 09/10/20  Yes Alcus Dad, MD  isosorbide mononitrate (IMDUR) 60 MG 24 hr tablet TAKE 1 TABLET (60 MG TOTAL) BY MOUTH DAILY. 09/10/20  Yes Alcus Dad, MD  LANTUS SOLOSTAR 100 UNIT/ML Solostar Pen Inject 40 Units into the skin daily. Patient taking differently: Inject 50 Units into the skin at bedtime. 10/13/20 11/14/20 Yes Dwyane Dee, MD  melatonin 3 MG TABS tablet TAKE 2 TABLETS (6 MG TOTAL) BY  MOUTH AT BEDTIME. 08/11/20  Yes Meccariello, Bernita Raisin, DO  metoCLOPramide (REGLAN) 5 MG tablet Take 5 mg by mouth 4 (four) times daily. 10/21/20  Yes [provider]  nitroGLYCERIN (NITROSTAT) 0.4 MG SL tablet Place 1 tablet (0.4 mg total) under the tongue every 5 (five) minutes as needed for chest pain. 12/13/18  Yes Lockamy, Timothy, DO  NOVOLOG FLEXPEN 100 UNIT/ML FlexPen Inject 1-15 Units into the skin in the morning, at noon, in the evening, and at bedtime. CBG 121 - 150: 2 units, CBG 151 - 200: 3 units, CBG 201 - 250: 5 units, CBG 251 - 300: 8 units, CBG 301 - 350: 11 units, CBG 351 - 400: 15 units, CBG > 400 call MD 10/13/20  Yes Dwyane Dee, MD  pantoprazole (PROTONIX) 40 MG tablet TAKE 1 TABLET (40 MG TOTAL) BY MOUTH 2 (TWO) TIMES DAILY. 10/22/20  Yes Alcus Dad, MD   spironolactone (ALDACTONE) 25 MG tablet Take 12.5 mg by mouth daily.   Yes [provider]  tiZANidine (ZANAFLEX) 4 MG tablet Take 4 mg by mouth 3 (three) times daily. 08/23/20  Yes [provider]  torsemide (DEMADEX) 20 MG tablet Take 2 tablets (40 mg total) by mouth daily. 07/27/20  Yes Shelly Coss, MD  blood glucose meter kit and supplies KIT Dispense based on patient and insurance preference. Use up to four times daily as directed. (FOR ICD-9 250.00, 250.01). 12/13/18   Lockamy, Timothy, DO  EASY COMFORT PEN NEEDLES 31G X 5 MM MISC USE 3 TIMES A DAY FOR INSULIN ADMINISTRATION 11/14/19   Meccariello, Mel Almond J, DO  ELIQUIS 5 MG TABS tablet TAKE 1 TABLET BY MOUTH 2 (TWO) TIMES DAILY. 12/20/19   Meccariello, Bernita Raisin, DO  metoprolol succinate (TOPROL-XL) 25 MG 24 hr tablet Take 25 mg by mouth daily. 09/05/20   [provider]  metoprolol succinate (TOPROL-XL) 50 MG 24 hr tablet Take 1 tablet (50 mg total) by mouth at bedtime. 09/10/20   Alcus Dad, MD  ondansetron (ZOFRAN ODT) 4 MG disintegrating tablet Take 1 tablet (4 mg total) by mouth every 8 (eight) hours as needed for nausea or vomiting. Patient not taking: Reported on 10/22/2020 09/14/20   Tedd Sias, PA  promethazine (PHENERGAN) 12.5 MG tablet Take 1 tablet (12.5 mg total) by mouth every 6 (six) hours as needed for nausea or vomiting. 07/27/20   Shelly Coss, MD    Physical Exam: Vitals:   10/22/20 2000 10/22/20 2100 10/22/20 2200 10/22/20 2230  BP: (!) 174/84 (!) 158/88 (!) 156/79 (!) 144/88  Pulse: 93 78 86 92  Resp: '17 18 18   ' Temp:      TempSrc:      SpO2: 100% 100% 98% 100%    Physical Exam Constitutional:      General: She is not in acute distress. HENT:     Head: Normocephalic and atraumatic.  Eyes:     Extraocular Movements: Extraocular movements intact.     Conjunctiva/sclera: Conjunctivae normal.  Cardiovascular:     Rate and Rhythm: Normal rate and regular rhythm.     Pulses:  Normal pulses.  Pulmonary:     Effort: Pulmonary effort is normal. No respiratory distress.     Breath sounds: Normal breath sounds. No wheezing or rales.  Abdominal:     General: Bowel sounds are normal. There is no distension.     Palpations: Abdomen is soft.     Tenderness: There is no guarding or rebound.  Comments: Generalized tenderness to palpation  Musculoskeletal:        General: No swelling or tenderness.     Cervical back: Normal range of motion and neck supple.  Skin:    General: Skin is warm and dry.  Neurological:     General: No focal deficit present.     Mental Status: She is alert and oriented to person, place, and time.     Labs on Admission: I have personally reviewed following labs and imaging studies  CBC: Recent Labs  Lab 10/22/20 1652  WBC 12.8*  NEUTROABS 9.1*  HGB 9.0*  HCT 29.6*  MCV 90.5  PLT 086*   Basic Metabolic Panel: Recent Labs  Lab 10/22/20 1652  NA 140  K 3.1*  CL 109  CO2 19*  GLUCOSE 82  BUN 19  CREATININE 1.37*  CALCIUM 8.7*   GFR: Estimated Creatinine Clearance: 67.3 mL/min (A) (by C-G formula based on SCr of 1.37 mg/dL (H)). Liver Function Tests: Recent Labs  Lab 10/22/20 1652  AST 18  ALT 11  ALKPHOS 97  BILITOT 0.3  PROT 8.6*  ALBUMIN 3.3*   Recent Labs  Lab 10/22/20 1652  LIPASE 28   No results for input(s): AMMONIA in the last 168 hours. Coagulation Profile: No results for input(s): INR, PROTIME in the last 168 hours. Cardiac Enzymes: No results for input(s): CKTOTAL, CKMB, CKMBINDEX, TROPONINI in the last 168 hours. BNP (last 3 results) No results for input(s): PROBNP in the last 8760 hours. HbA1C: No results for input(s): HGBA1C in the last 72 hours. CBG: Recent Labs  Lab 10/22/20 1643  GLUCAP 86   Lipid Profile: No results for input(s): CHOL, HDL, LDLCALC, TRIG, CHOLHDL, LDLDIRECT in the last 72 hours. Thyroid Function Tests: No results for input(s): TSH, T4TOTAL, FREET4, T3FREE,  THYROIDAB in the last 72 hours. Anemia Panel: No results for input(s): VITAMINB12, FOLATE, FERRITIN, TIBC, IRON, RETICCTPCT in the last 72 hours. Urine analysis:    Component Value Date/Time   COLORURINE YELLOW 10/22/2020 [place] 10/22/2020 1641   LABSPEC 1.010 10/22/2020 1641   PHURINE 6.0 10/22/2020 1641   GLUCOSEU 150 (A) 10/22/2020 1641   HGBUR NEGATIVE 10/22/2020 1641   BILIRUBINUR NEGATIVE 10/22/2020 1641   KETONESUR NEGATIVE 10/22/2020 1641   PROTEINUR 100 (A) 10/22/2020 1641   UROBILINOGEN 0.2 10/02/2013 2108   NITRITE NEGATIVE 10/22/2020 1641   LEUKOCYTESUR NEGATIVE 10/22/2020 1641    Radiological Exams on Admission: CT Abdomen Pelvis Wo Contrast  Result Date: 10/22/2020 CLINICAL DATA:  Bowel obstruction suspected.  Abd pain, n/v, EXAM: CT ABDOMEN AND PELVIS WITHOUT CONTRAST TECHNIQUE: Multidetector CT imaging of the abdomen and pelvis was performed following the standard protocol without IV contrast. COMPARISON:  CT abdomen pelvis 09/14/2020. FINDINGS: Lower chest: No acute abnormality. Hepatobiliary: The liver is enlarged measuring up to 21.5 Cm. No focal liver abnormality. Status post cholecystectomy. No biliary dilatation. Pancreas: No focal lesion. Normal pancreatic contour. No surrounding inflammatory changes. No main pancreatic ductal dilatation. Spleen: Normal in size without focal abnormality. Adrenals/Urinary Tract: No adrenal nodule bilaterally. Bilateral kidneys enhance symmetrically. No hydronephrosis. No hydroureter. The urinary bladder is unremarkable. Stomach/Bowel: Stomach is within normal limits. No evidence of bowel wall thickening or dilatation. Appendix appears normal. Vascular/Lymphatic: No abdominal aorta or iliac aneurysm. Mild atherosclerotic plaque of the aorta and its branches. No abdominal, pelvic, or inguinal lymphadenopathy. Reproductive: Uterus and bilateral adnexa are unremarkable. Other: No intraperitoneal free fluid. No  intraperitoneal free gas. No organized fluid collection.  Musculoskeletal: No abdominal wall hernia or abnormality. No suspicious lytic or blastic osseous lesions. No acute displaced fracture. Severe degenerative changes of the L5-S1 level. Grade 1 anterolisthesis of L4 on L5. IMPRESSION: 1. No acute intra-abdominal or intrapelvic abnormality with limited evaluation on this noncontrast study. 2. Hepatomegaly. 3.  Aortic Atherosclerosis (ICD10-I70.0). Electronically Signed   By: Iven Finn M.D.   On: 10/22/2020 18:09    Assessment/Plan Principal Problem:   Abdominal pain Active Problems:   Essential hypertension, benign   Chronic kidney disease, stage 3b (HCC)   Nausea and vomiting   Chronic systolic CHF (congestive heart failure) (HCC)   Intractable abdominal pain and nausea/vomiting Patient with history of chronic abdominal pain and diabetic gastroparesis presenting with complaints of worsening abdominal pain, nausea, vomiting x3-4 days.  Lipase and LFTs normal.  UA not suggestive of infection.  Patient has a high risk allergy to IV contrast.  CT abdomen pelvis without contrast negative for acute finding.  She has a history of not being able to tolerate Reglan due to extrapyramidal symptoms and treatment complicated by prolonged QT interval. -Symptomatic management: Gentle IV fluid hydration.  Pain management.  Continue PPI (IV Protonix 40 mg every 12 hours) given history of esophagitis/PUD.  Scopolamine patch.  Avoid QT prolonging antiemetics if possible.  Order EKG. Check UDS.  Try clear liquid diet, advance as tolerated.  Mild hypokalemia -Monitor potassium and magnesium levels, replenish as needed.  Mild normal anion gap metabolic acidosis -Gentle IV fluid hydration, repeat BMP in a.m.  CKD stage IIIb Stable.  Creatinine improved compared to prior labs. -Continue to monitor  Insulin-dependent type 2 diabetes A1c 7.8 on 10/08/2020. -Sliding scale insulin sensitive every 4 hours for  now.  Resume basal insulin after p.o. intake improves.  Chronic systolic CHF EF 45 to 34% on echo done May 2021.  Appears hypovolemic at this time in setting of ongoing nausea and vomiting. -Gentle IV fluid hydration and monitor volume status very closely.  Chronic anemia, leukocytosis, thrombocytosis -Continue to monitor  Paroxysmal A. Fib Patient has not been able to take Eliquis for the past few days due to intractable nausea and vomiting. -Start IV heparin  DVT prophylaxis: IV heparin Code Status: Full code Family Communication: No family available at this time. Disposition Plan: Status is: Observation  The patient remains OBS appropriate and will d/c before 2 midnights.  Dispo: The patient is from: Home              Anticipated d/c is to: Home              Patient currently is not medically stable to d/c.   Difficult to place patient No  Level of care: Level of care: Telemetry  The medical decision making on this patient was of high complexity and the patient is at high risk for clinical deterioration, therefore this is a level 3 visit.  Shela Leff MD [facility]  If 7PM-7AM, please contact night-coverage [email or web address]  10/23/2020, 12:18 AM

## 2020-10-22 NOTE — ED Provider Notes (Signed)
Emergency Medicine Provider Triage Evaluation Note  Deborah [name] , a 55 y.o. female  was evaluated in triage.  Pt complains of abdominal pain, nausea, and vomiting.  Patient reports that abdominal pain started earlier this morning.  Patient reports that pain starts in her lower abdomen and moves up to epigastric area.  Patient describes pain as sharp.  Patient endorses vomiting 6 times today.  Patient denies any hematemesis or coffee-ground emesis.  Patient denies any relief of symptoms with Pepto-Bismol at home.  Patient endorses diarrhea at baseline  Review of Systems  Positive: Abdominal pain, nausea, vomiting Negative: Fever, chills, hematuria, dysuria, urinary frequency, constipation  Physical Exam  BP 128/75   Pulse 88   Temp 98.2 F (36.8 C) (Oral)   Resp 20   LMP 10/10/2012   SpO2 97%  Gen:   Awake, no distress   Resp:  Normal effort  MSK:   Moves extremities without difficulty  Other:  Abdomen soft, nondistended, generalized tenderness throughout abdomen.  Medical Decision Making  Medically screening exam initiated at 1:02 PM.  Appropriate orders placed.  KEYERA HATTABAUGH was informed that the remainder of the evaluation will be completed by another provider, this initial triage assessment does not replace that evaluation, and the importance of remaining in the ED until their evaluation is complete.  The patient appears stable so that the remainder of the work up may be completed by another provider.      Dyann Ruddle 10/22/20 1303    Hayden Rasmussen, MD 10/22/20 [place]

## 2020-10-22 NOTE — ED Notes (Signed)
Pt family member dropped off Chief Executive Officer at EMT first desk, I provided it to the pt.

## 2020-10-22 NOTE — ED Provider Notes (Signed)
[facility] DEPT Provider Note   [name]: [id number] Arrival date & time: 10/22/20  1229     History Chief Complaint  Patient presents with   Abdominal Pain   Emesis    Deborah [name] is a 55 y.o. female.  States that this does not feel consistent with her gastroparesis.  Rather, it feels similar to when she had to have her surgery for the carcinoid tumor.  The history is provided by the patient.  Abdominal Pain Pain location:  Generalized Pain quality: sharp and stabbing   Pain radiates to:  Chest Pain severity:  Moderate Onset quality:  Gradual Duration:  1 day Timing:  Constant Progression:  Worsening Chronicity:  New Context: awakening from sleep and previous surgery (carcinoid tumor of duodenum)   Relieved by:  Nothing Worsened by:  Deep breathing Ineffective treatments:  None tried Associated symptoms: diarrhea (chronic), nausea, shortness of breath and vomiting   Associated symptoms: no chest pain, no chills, no cough, no dysuria, no fever, no hematuria and no sore throat       Past Medical History:  Diagnosis Date   Acute back pain with sciatica, left    Acute back pain with sciatica, right    AKI (acute kidney injury) ([place])    Anemia, unspecified    Cancer (HCC)    Carcinoid tumor of duodenum    Chronic pain    Chronic systolic CHF (congestive heart failure) (HCC)    Diabetes mellitus    Esophageal reflux    Fibromyalgia    Gastric ulcer    Gastroparesis    Gout    Hyperlipidemia    Hypertension    Lumbosacral stenosis    NICM (nonischemic cardiomyopathy) (HCC)    Obesity    PAF (paroxysmal atrial fibrillation) ([place])    Stroke ([place]) 02/2011   Vitamin B12 deficiency anemia     Patient Active Problem List   Diagnosis Date Noted   PAF (paroxysmal atrial fibrillation) (HCC)    Prolonged QT interval    Carcinoid tumor of duodenum    Non-alcoholic cirrhosis (HCC)    Thrombocytosis    Acute renal failure  superimposed on stage 3b chronic kidney disease (HCC)    ARF (acute renal failure) ([place]) 07/21/2020   Vaginal yeast infection 01/04/2020   AKI (acute kidney injury) ([place]) 11/27/2019   Chronic pain 09/04/2019   Malignant carcinoid tumor of duodenum ([place])    Nonalcoholic fatty liver disease 06/05/2019   Chronic diarrhea    COPD exacerbation (HCC) 05/08/2019   Acute on chronic HFrEF (heart failure with reduced ejection fraction) (HCC)    Restrictive lung disease secondary to obesity    History of gastric ulcer    Uncontrolled type 2 diabetes mellitus with hyperglycemia (HCC)    Fibroid uterus 00/86/7619   Chronic systolic CHF (congestive heart failure) (HCC) 12/19/2018   Intractable nausea and vomiting 12/17/2018   Hypoxia 12/17/2018   Abdominal pain 07/17/2018   Cardiac arrest ([place]) 11/29/2017   Leukocytosis 11/29/2017   Anxiety 11/29/2017   Stroke (cerebrum) ([place]) 03/19/2017   GERD (gastroesophageal reflux disease) 03/19/2017   Depression 03/19/2017   Morbid obesity ([place])    Normocytic anemia 08/16/2016   Diabetic gastroparesis ([place]) 06/05/2016   Gout 06/05/2016   Chest pain 09/26/2015   Hypokalemia 09/26/2015   Hypomagnesemia 09/26/2015   Nausea and vomiting 08/20/2015   Uncontrolled type 2 diabetes mellitus with diabetic neuropathy, with long-term current use of insulin ([place]) 05/25/2015   Type 2 diabetes  mellitus with hyperglycemia, with long-term current use of insulin ([place]) 05/25/2015   Chronic kidney disease, stage 3b ([place]) 05/25/2015   Essential hypertension, benign 09/28/2013    Past Surgical History:  Procedure Laterality Date   BIOPSY  07/27/2019   Procedure: BIOPSY;  Surgeon: Clarene Essex, MD;  Location: [facility];  Service: Endoscopy;;   BIOPSY  07/30/2019   Procedure: BIOPSY;  Surgeon: Otis Brace, MD;  Location: [facility];  Service: Gastroenterology;;   CATARACT EXTRACTION  01/2014   CHOLECYSTECTOMY     COLONOSCOPY WITH PROPOFOL N/A 07/30/2019    Procedure: COLONOSCOPY WITH PROPOFOL;  Surgeon: Otis Brace, MD;  Location: [facility];  Service: Gastroenterology;  Laterality: N/A;   ESOPHAGOGASTRODUODENOSCOPY N/A 07/27/2019   Procedure: ESOPHAGOGASTRODUODENOSCOPY (EGD);  Surgeon: Clarene Essex, MD;  Location: [facility];  Service: Endoscopy;  Laterality: N/A;   ESOPHAGOGASTRODUODENOSCOPY N/A 07/26/2020   Procedure: ESOPHAGOGASTRODUODENOSCOPY (EGD);  Surgeon: Arta Silence, MD;  Location: [facility];  Service: Endoscopy;  Laterality: N/A;   ESOPHAGOGASTRODUODENOSCOPY (EGD) WITH PROPOFOL N/A 08/02/2019   Procedure: ESOPHAGOGASTRODUODENOSCOPY (EGD) WITH PROPOFOL;  Surgeon: Otis Brace, MD;  Location: [facility];  Service: Gastroenterology;  Laterality: N/A;   HEMOSTASIS CLIP PLACEMENT  08/02/2019   Procedure: HEMOSTASIS CLIP PLACEMENT;  Surgeon: Otis Brace, MD;  Location: [facility];  Service: Gastroenterology;;   POLYPECTOMY  07/30/2019   Procedure: POLYPECTOMY;  Surgeon: Otis Brace, MD;  [facility];  Service: Gastroenterology;;   POLYPECTOMY  08/02/2019   Procedure: POLYPECTOMY;  Surgeon: Otis Brace, MD;  Location: [facility];  Service: Gastroenterology;;     OB History     Gravida  0   Para  0   Term  0   Preterm  0   AB  0   Living  0      SAB  0   IAB  0   Ectopic  0   Multiple  0   Live Births  0           Family History  Problem Relation Age of Onset   Diabetes Mother    Diabetes Father    Heart disease Father    Diabetes Sister    Congestive Heart Failure Sister 75   Diabetes Brother     Social History   Tobacco Use   Smoking status: Never   Smokeless tobacco: Never  Vaping Use   Vaping Use: Never used  Substance Use Topics   Alcohol use: No   Drug use: No    Home Medications Prior to Admission medications   Medication Sig Start Date End Date Taking? Authorizing Provider  albuterol (PROVENTIL) (2.5 MG/3ML) 0.083% nebulizer solution Take  3 mLs (2.5 mg total) by nebulization every 6 (six) hours as needed for wheezing or shortness of breath. 04/06/19   Scot Jun, FNP  atorvastatin (LIPITOR) 10 MG tablet TAKE ONE TABLET BY MOUTH ONCE DAILY (AM) 10/22/20   Alcus Dad, MD  blood glucose meter kit and supplies KIT Dispense based on patient and insurance preference. Use up to four times daily as directed. (FOR ICD-9 250.00, 250.01). 12/13/18   [name], DO  DULoxetine HCl 40 MG CPEP TAKE ONE CAPSULE BY MOUTH TWICE DAILY 10/22/20   Alcus Dad, MD  EASY COMFORT PEN NEEDLES 31G X 5 MM MISC USE 3 TIMES A DAY FOR INSULIN ADMINISTRATION 11/14/19   Meccariello, Mel Almond J, DO  ELIQUIS 5 MG TABS tablet TAKE 1 TABLET BY MOUTH 2 (TWO) TIMES DAILY. 12/20/19   Meccariello, Bernita Raisin, DO  fluticasone (FLONASE) 50 MCG/ACT nasal spray Place 2 sprays into both nostrils daily as needed for allergies or rhinitis. 12/19/18   Rai, Vernelle Emerald, MD  hydrALAZINE (APRESOLINE) 50 MG tablet Take 1 tablet (50 mg total) by mouth 3 (three) times daily. 09/10/20   Alcus Dad, MD  isosorbide mononitrate (IMDUR) 60 MG 24 hr tablet TAKE 1 TABLET (60 MG TOTAL) BY MOUTH DAILY. 09/10/20   Alcus Dad, MD  LANTUS SOLOSTAR 100 UNIT/ML Solostar Pen Inject 40 Units into the skin daily. 10/13/20 11/14/20  Dwyane Dee, MD  melatonin 3 MG TABS tablet TAKE 2 TABLETS (6 MG TOTAL) BY MOUTH AT BEDTIME. 08/11/20   Meccariello, Bernita Raisin, DO  metoprolol succinate (TOPROL-XL) 50 MG 24 hr tablet Take 1 tablet (50 mg total) by mouth at bedtime. 09/10/20   Alcus Dad, MD  nitroGLYCERIN (NITROSTAT) 0.4 MG SL tablet Place 1 tablet (0.4 mg total) under the tongue every 5 (five) minutes as needed for chest pain. 12/13/18   Lockamy, Timothy, DO  NOVOLOG FLEXPEN 100 UNIT/ML FlexPen Inject 1-15 Units into the skin in the morning, at noon, in the evening, and at bedtime. CBG 121 - 150: 2 units, CBG 151 - 200: 3 units, CBG 201 - 250: 5 units, CBG 251 - 300: 8 units, CBG 301 - 350: 11  units, CBG 351 - 400: 15 units, CBG > 400 call MD 10/13/20   Dwyane Dee, MD  ondansetron (ZOFRAN ODT) 4 MG disintegrating tablet Take 1 tablet (4 mg total) by mouth every 8 (eight) hours as needed for nausea or vomiting. 09/14/20   F[place]aw, Kathleene Hazel, PA  pantoprazole (PROTONIX) 40 MG tablet TAKE 1 TABLET (40 MG TOTAL) BY MOUTH 2 (TWO) TIMES DAILY. 10/22/20   Alcus Dad, MD  promethazine (PHENERGAN) 12.5 MG tablet Take 1 tablet (12.5 mg total) by mouth every 6 (six) hours as needed for nausea or vomiting. 07/27/20   Shelly Coss, MD  spironolactone (ALDACTONE) 25 MG tablet Take 12.5 mg by mouth daily.    [provider]  torsemide (DEMADEX) 20 MG tablet Take 2 tablets (40 mg total) by mouth daily. 07/27/20   Shelly Coss, MD    Allergies    Diazepam, Gabapentin, Iodinated diagnostic agents, Isovue [iopamidol], Lisinopril, Metoclopramide, Nsaids, Penicillins, Tolmetin, Aspirin, Cyclobenzaprine, Rifamycins, Acetaminophen, Oxycodone, and Tramadol  Review of Systems   Review of Systems  Constitutional:  Negative for chills and fever.  HENT:  Negative for ear pain and sore throat.   Eyes:  Negative for pain and visual disturbance.  Respiratory:  Positive for shortness of breath. Negative for cough.   Cardiovascular:  Negative for chest pain and palpitations.  Gastrointestinal:  Positive for abdominal pain, diarrhea (chronic), nausea and vomiting.  Genitourinary:  Negative for dysuria and hematuria.  Musculoskeletal:  Negative for arthralgias and back pain.  Skin:  Negative for color change and rash.  Neurological:  Negative for seizures and syncope.  All other systems reviewed and are negative.  Physical Exam Updated Vital Signs BP 122/86   Pulse 91   Temp 98.2 F (36.8 C) (Oral)   Resp 20   LMP 10/10/2012   SpO2 100%   Physical Exam Vitals and nursing note reviewed.  Constitutional:      Appearance: She is well-developed.  HENT:     Head: Normocephalic and  atraumatic.  Cardiovascular:     Rate and Rhythm: Normal rate and regular rhythm.     Heart sounds: Normal heart sounds.  Pulmonary:  Effort: Pulmonary effort is normal. No tachypnea.     Breath sounds: Normal breath sounds.  Abdominal:     General: Bowel sounds are decreased.     Palpations: Abdomen is soft.     Tenderness: There is generalized abdominal tenderness. There is no guarding or rebound.  Musculoskeletal:     Right lower leg: No edema.     Left lower leg: No edema.  Skin:    General: Skin is warm and dry.  Neurological:     General: No focal deficit present.     Mental Status: She is alert and oriented to person, place, and time.  Psychiatric:        Mood and Affect: Mood normal.        Behavior: Behavior normal.    ED Results / Procedures / Treatments   Labs (all labs ordered are listed, but only abnormal results are displayed) Labs Reviewed  CBC WITH DIFFERENTIAL/PLATELET - Abnormal; Notable for the following components:      Result Value   WBC 12.8 (*)    RBC 3.27 (*)    Hemoglobin 9.0 (*)    HCT 29.6 (*)    RDW 16.7 (*)    Platelets 530 (*)    Neutro Abs 9.1 (*)    Abs Immature Granulocytes 0.08 (*)    All other components within normal limits  COMPREHENSIVE METABOLIC PANEL - Abnormal; Notable for the following components:   Potassium 3.1 (*)    CO2 19 (*)    Creatinine, Ser 1.37 (*)    Calcium 8.7 (*)    Total Protein 8.6 (*)    Albumin 3.3 (*)    GFR, Estimated 45 (*)    All other components within normal limits  URINALYSIS, ROUTINE W REFLEX MICROSCOPIC - Abnormal; Notable for the following components:   Glucose, UA 150 (*)    Protein, ur 100 (*)    Bacteria, UA RARE (*)    All other components within normal limits  LIPASE, BLOOD  CBG MONITORING, ED    EKG None  Radiology CT Abdomen Pelvis Wo Contrast  Result Date: 10/22/2020 CLINICAL DATA:  Bowel obstruction suspected.  Abd pain, n/v, EXAM: CT ABDOMEN AND PELVIS WITHOUT CONTRAST  TECHNIQUE: Multidetector CT imaging of the abdomen and pelvis was performed following the standard protocol without IV contrast. COMPARISON:  CT abdomen pelvis 09/14/2020. FINDINGS: Lower chest: No acute abnormality. Hepatobiliary: The liver is enlarged measuring up to 21.5 Cm. No focal liver abnormality. Status post cholecystectomy. No biliary dilatation. Pancreas: No focal lesion. Normal pancreatic contour. No surrounding inflammatory changes. No main pancreatic ductal dilatation. Spleen: Normal in size without focal abnormality. Adrenals/Urinary Tract: No adrenal nodule bilaterally. Bilateral kidneys enhance symmetrically. No hydronephrosis. No hydroureter. The urinary bladder is unremarkable. Stomach/Bowel: Stomach is within normal limits. No evidence of bowel wall thickening or dilatation. Appendix appears normal. Vascular/Lymphatic: No abdominal aorta or iliac aneurysm. Mild atherosclerotic plaque of the aorta and its branches. No abdominal, pelvic, or inguinal lymphadenopathy. Reproductive: Uterus and bilateral adnexa are unremarkable. Other: No intraperitoneal free fluid. No intraperitoneal free gas. No organized fluid collection. Musculoskeletal: No abdominal wall hernia or abnormality. No suspicious lytic or blastic osseous lesions. No acute displaced fracture. Severe degenerative changes of the L5-S1 level. Grade 1 anterolisthesis of L4 on L5. IMPRESSION: 1. No acute intra-abdominal or intrapelvic abnormality with limited evaluation on this noncontrast study. 2. Hepatomegaly. 3.  Aortic Atherosclerosis (ICD10-I70.0). Electronically Signed   By: Iven Finn M.D.   On: 10/22/2020  18:09    Procedures Procedures   Medications Ordered in ED Medications  HYDROmorphone (DILAUDID) injection 1 mg (1 mg Intravenous Given 10/22/20 1747)  ondansetron (ZOFRAN) injection 4 mg (4 mg Intravenous Given 10/22/20 1747)  sodium chloride 0.9 % bolus 1,000 mL (1,000 mLs Intravenous New Bag/Given 10/22/20 1956)   HYDROmorphone (DILAUDID) injection 1 mg (1 mg Intravenous Given 10/22/20 1956)  ondansetron (ZOFRAN) injection 4 mg (4 mg Intravenous Given 10/22/20 1956)  HYDROmorphone (DILAUDID) injection 1 mg (1 mg Intravenous Given 10/22/20 2239)  ondansetron (ZOFRAN) injection 4 mg (4 mg Intravenous Given 10/22/20 2238)    ED Course  I have reviewed the triage vital signs and the nursing notes.  Pertinent labs & imaging results that were available during my care of the patient were reviewed by me and considered in my medical decision making (see chart for details).  Clinical Course as of 10/22/20 2315  Wed Oct 22, 2020  2313 I spoke with Dr. Marlowe Sax who will admit the patient. [AW]    Clinical Course User Index [AW] Arnaldo Natal, MD   MDM Rules/Calculators/A&P                           Deborah [name] presents with abdominal pain, nausea, and vomiting.  She has gastroparesis, but she was concerned that the abdominal pain was atypical for this diagnosis.  She was evaluated for evidence of intestinal obstruction given her multiple abdominal surgeries.  CT scan was unremarkable.  She was given multiple rounds of pain medicine and antiemetics and was still feeling nauseated and unable to tolerate oral fluid and food. Final Clinical Impression(s) / ED Diagnoses Final diagnoses:  Diabetic gastroparesis ([place])  Chronic kidney disease, stage 3b ([place])  Hypokalemia    Rx / DC Orders ED Discharge Orders     None        Arnaldo Natal, MD 10/22/20 2315

## 2020-10-22 NOTE — ED Notes (Signed)
Patient refusing blood draw at this time, Frederico Hamman RN made aware.

## 2020-10-22 NOTE — ED Notes (Signed)
Beverage offered to the patient.

## 2020-10-23 ENCOUNTER — Encounter (HOSPITAL_COMMUNITY): Payer: Self-pay | Admitting: Internal Medicine

## 2020-10-23 DIAGNOSIS — N1832 Chronic kidney disease, stage 3b: Secondary | ICD-10-CM | POA: Diagnosis not present

## 2020-10-23 DIAGNOSIS — R112 Nausea with vomiting, unspecified: Secondary | ICD-10-CM | POA: Diagnosis not present

## 2020-10-23 DIAGNOSIS — Z794 Long term (current) use of insulin: Secondary | ICD-10-CM

## 2020-10-23 DIAGNOSIS — E1165 Type 2 diabetes mellitus with hyperglycemia: Secondary | ICD-10-CM

## 2020-10-23 DIAGNOSIS — E1143 Type 2 diabetes mellitus with diabetic autonomic (poly)neuropathy: Secondary | ICD-10-CM | POA: Diagnosis not present

## 2020-10-23 DIAGNOSIS — I480 Paroxysmal atrial fibrillation: Secondary | ICD-10-CM

## 2020-10-23 DIAGNOSIS — K3184 Gastroparesis: Secondary | ICD-10-CM

## 2020-10-23 LAB — BASIC METABOLIC PANEL
Anion gap: 10 (ref 5–15)
BUN: 16 mg/dL (ref 6–20)
CO2: 19 mmol/L — ABNORMAL LOW (ref 22–32)
Calcium: 8.2 mg/dL — ABNORMAL LOW (ref 8.9–10.3)
Chloride: 112 mmol/L — ABNORMAL HIGH (ref 98–111)
Creatinine, Ser: 1.18 mg/dL — ABNORMAL HIGH (ref 0.44–1.00)
GFR, Estimated: 54 mL/min — ABNORMAL LOW (ref >60)
Glucose, Bld: 112 mg/dL — ABNORMAL HIGH (ref 70–99)
Potassium: 2.9 mmol/L — ABNORMAL LOW (ref 3.5–5.1)
Sodium: 141 mmol/L (ref 135–145)

## 2020-10-23 LAB — RESP PANEL BY RT-PCR (FLU A&B, COVID) ARPGX2
Influenza A by PCR: NEGATIVE
Influenza B by PCR: NEGATIVE
SARS Coronavirus 2 by RT PCR: NEGATIVE

## 2020-10-23 LAB — CBC
HCT: 26.5 % — ABNORMAL LOW (ref 36.0–46.0)
Hemoglobin: 8 g/dL — ABNORMAL LOW (ref 12.0–15.0)
MCH: 27.5 pg (ref 26.0–34.0)
MCHC: 30.2 g/dL (ref 30.0–36.0)
MCV: 91.1 fL (ref 80.0–100.0)
Platelets: 435 10*3/uL — ABNORMAL HIGH (ref 150–400)
RBC: 2.91 MIL/uL — ABNORMAL LOW (ref 3.87–5.11)
RDW: 16.6 % — ABNORMAL HIGH (ref 11.5–15.5)
WBC: 10.8 10*3/uL — ABNORMAL HIGH (ref 4.0–10.5)
nRBC: 0 % (ref 0.0–0.2)

## 2020-10-23 LAB — RAPID URINE DRUG SCREEN, HOSP PERFORMED
Amphetamines: NOT DETECTED
Barbiturates: NOT DETECTED
Benzodiazepines: NOT DETECTED
Cocaine: NOT DETECTED
Opiates: NOT DETECTED
Tetrahydrocannabinol: NOT DETECTED

## 2020-10-23 LAB — CBG MONITORING, ED
Glucose-Capillary: 101 mg/dL — ABNORMAL HIGH (ref 70–99)
Glucose-Capillary: 107 mg/dL — ABNORMAL HIGH (ref 70–99)
Glucose-Capillary: 141 mg/dL — ABNORMAL HIGH (ref 70–99)
Glucose-Capillary: 88 mg/dL (ref 70–99)

## 2020-10-23 LAB — HEPARIN LEVEL (UNFRACTIONATED)
Heparin Unfractionated: <0.1 IU/mL — ABNORMAL LOW (ref 0.30–0.70)
Heparin Unfractionated: 0.15 IU/mL — ABNORMAL LOW (ref 0.30–0.70)

## 2020-10-23 LAB — APTT: aPTT: 42 seconds — ABNORMAL HIGH (ref 24–36)

## 2020-10-23 LAB — HIV ANTIBODY (ROUTINE TESTING W REFLEX): HIV Screen 4th Generation wRfx: NONREACTIVE

## 2020-10-23 LAB — GLUCOSE, CAPILLARY
Glucose-Capillary: 169 mg/dL — ABNORMAL HIGH (ref 70–99)
Glucose-Capillary: 190 mg/dL — ABNORMAL HIGH (ref 70–99)

## 2020-10-23 LAB — MAGNESIUM: Magnesium: 2.1 mg/dL (ref 1.7–2.4)

## 2020-10-23 MED ORDER — HYDRALAZINE HCL 50 MG PO TABS
50.0000 mg | ORAL_TABLET | Freq: Three times a day (TID) | ORAL | Status: DC
  Administered 2020-10-23 – 2020-10-26 (×9): 50 mg via ORAL
  Filled 2020-10-23 (×9): qty 1

## 2020-10-23 MED ORDER — HEPARIN BOLUS VIA INFUSION
4000.0000 [IU] | Freq: Once | INTRAVENOUS | Status: AC
  Administered 2020-10-23: 4000 [IU] via INTRAVENOUS
  Filled 2020-10-23: qty 4000

## 2020-10-23 MED ORDER — APIXABAN 5 MG PO TABS
5.0000 mg | ORAL_TABLET | Freq: Two times a day (BID) | ORAL | Status: DC
  Administered 2020-10-23 – 2020-10-26 (×6): 5 mg via ORAL
  Filled 2020-10-23 (×6): qty 1

## 2020-10-23 MED ORDER — FLUTICASONE PROPIONATE 50 MCG/ACT NA SUSP
2.0000 | Freq: Every day | NASAL | Status: DC | PRN
  Filled 2020-10-23: qty 16

## 2020-10-23 MED ORDER — SCOPOLAMINE 1 MG/3DAYS TD PT72
1.0000 | MEDICATED_PATCH | TRANSDERMAL | Status: DC
  Administered 2020-10-23 – 2020-10-26 (×2): 1.5 mg via TRANSDERMAL
  Filled 2020-10-23 (×2): qty 1

## 2020-10-23 MED ORDER — HEPARIN BOLUS VIA INFUSION
2500.0000 [IU] | Freq: Once | INTRAVENOUS | Status: AC
  Administered 2020-10-23: 2500 [IU] via INTRAVENOUS
  Filled 2020-10-23: qty 2500

## 2020-10-23 MED ORDER — HYDROMORPHONE HCL 1 MG/ML IJ SOLN
1.0000 mg | INTRAMUSCULAR | Status: DC | PRN
  Administered 2020-10-23 (×4): 1 mg via INTRAVENOUS
  Filled 2020-10-23 (×5): qty 1

## 2020-10-23 MED ORDER — INSULIN ASPART 100 UNIT/ML IJ SOLN
0.0000 [IU] | INTRAMUSCULAR | Status: DC
  Administered 2020-10-23: 2 [IU] via SUBCUTANEOUS
  Administered 2020-10-23: 1 [IU] via SUBCUTANEOUS
  Administered 2020-10-23: 2 [IU] via SUBCUTANEOUS
  Administered 2020-10-24: 1 [IU] via SUBCUTANEOUS
  Administered 2020-10-24 (×2): 3 [IU] via SUBCUTANEOUS
  Administered 2020-10-24 (×2): 2 [IU] via SUBCUTANEOUS
  Administered 2020-10-24: 3 [IU] via SUBCUTANEOUS
  Administered 2020-10-25: 5 [IU] via SUBCUTANEOUS
  Administered 2020-10-25: 3 [IU] via SUBCUTANEOUS
  Filled 2020-10-23: qty 0.09

## 2020-10-23 MED ORDER — POTASSIUM CHLORIDE 10 MEQ/100ML IV SOLN
10.0000 meq | INTRAVENOUS | Status: AC
Start: 2020-10-23 — End: 2020-10-23
  Administered 2020-10-23 (×4): 10 meq via INTRAVENOUS
  Filled 2020-10-23 (×4): qty 100

## 2020-10-23 MED ORDER — PANTOPRAZOLE SODIUM 40 MG IV SOLR
40.0000 mg | Freq: Two times a day (BID) | INTRAVENOUS | Status: DC
  Administered 2020-10-23 – 2020-10-26 (×8): 40 mg via INTRAVENOUS
  Filled 2020-10-23 (×8): qty 40

## 2020-10-23 MED ORDER — HEPARIN (PORCINE) 25000 UT/250ML-% IV SOLN
1800.0000 [IU]/h | INTRAVENOUS | Status: DC
  Administered 2020-10-23: 1550 [IU]/h via INTRAVENOUS
  Administered 2020-10-23: 1800 [IU]/h via INTRAVENOUS
  Filled 2020-10-23 (×2): qty 250

## 2020-10-23 MED ORDER — DULOXETINE HCL 20 MG PO CPEP
40.0000 mg | ORAL_CAPSULE | Freq: Two times a day (BID) | ORAL | Status: DC
  Administered 2020-10-23 – 2020-10-26 (×6): 40 mg via ORAL
  Filled 2020-10-23 (×7): qty 2

## 2020-10-23 MED ORDER — METOPROLOL SUCCINATE ER 25 MG PO TB24
25.0000 mg | ORAL_TABLET | Freq: Every day | ORAL | Status: DC
  Administered 2020-10-23 – 2020-10-26 (×4): 25 mg via ORAL
  Filled 2020-10-23 (×4): qty 1

## 2020-10-23 MED ORDER — POTASSIUM CHLORIDE 10 MEQ/100ML IV SOLN
10.0000 meq | INTRAVENOUS | Status: AC
  Administered 2020-10-23 (×4): 10 meq via INTRAVENOUS
  Filled 2020-10-23 (×4): qty 100

## 2020-10-23 MED ORDER — HYDROMORPHONE HCL 1 MG/ML IJ SOLN
0.5000 mg | INTRAMUSCULAR | Status: DC | PRN
  Administered 2020-10-23 – 2020-10-24 (×3): 0.5 mg via INTRAVENOUS
  Filled 2020-10-23 (×3): qty 0.5

## 2020-10-23 MED ORDER — ISOSORBIDE MONONITRATE ER 60 MG PO TB24
60.0000 mg | ORAL_TABLET | Freq: Every day | ORAL | Status: DC
  Administered 2020-10-23 – 2020-10-26 (×4): 60 mg via ORAL
  Filled 2020-10-23 (×4): qty 1

## 2020-10-23 MED ORDER — NITROGLYCERIN 0.4 MG SL SUBL: 0.4000 mg | SUBLINGUAL_TABLET | SUBLINGUAL | Status: DC | PRN

## 2020-10-23 MED ORDER — SODIUM CHLORIDE 0.9 % IV SOLN: INTRAVENOUS | Status: AC

## 2020-10-23 NOTE — Hospital Course (Signed)
55 year old woman PMH including diabetic gastroparesis, carcinoid tumor of the duodenum status postresection, peptic ulcer disease, diabetes mellitus type 2 presented with generalized abdominal pain nausea and vomiting.  Admitted for intractable abdominal pain nausea and vomiting secondary to diabetic gastroparesis.

## 2020-10-23 NOTE — Plan of Care (Signed)

## 2020-10-23 NOTE — Progress Notes (Addendum)
ANTICOAGULATION CONSULT NOTE - Initial Consult  Pharmacy Consult for Heparin Indication: atrial fibrillation (on Eliquis PTA)  Allergies  Allergen Reactions   Diazepam [name]   Gabapentin [name] and Swelling    Other reaction(s): Unknown   Iodinated Diagnostic Agents Anaphylaxis    11/29/17 Cardiac arrest 1 min after IV contrast, possible allergy vs vasovagal episode Iopamidol  Anaphylaxis  High 11/28/2017  Patient had seizure like activity and then code post 100 cc of isovue 300     Isovue [Iopamidol] Anaphylaxis    11/28/17 Patient had seizure like activity and then 1 min code after 100 cc of isovue 300. Possible contrast allergy vs vasovagal episode   Lisinopril Anaphylaxis    Tongue and mouth swelling   Metoclopramide Other (See Comments)    Tardive dyskinesia   Nsaids Anaphylaxis and Other (See Comments)    ULCER   Penicillins Palpitations    Has patient had a PCN reaction causing immediate rash, facial/tongue/throat swelling, SOB or lightheadedness with hypotension: Yes, heart races Has patient had a PCN reaction causing severe rash involving mucus membranes or skin necrosis: No Has patient had a PCN reaction that required hospitalization: Yes  Has patient had a PCN reaction occurring within the last 10 years: No    Tolmetin Nausea Only and Other (See Comments)    ULCER   Aspirin Other (See Comments)    Irritates stomach ulcer    Cyclobenzaprine     Other reaction(s): Unknown   Rifamycins    Acetaminophen Nausea Only and Other (See Comments)    Irritates stomach ulcer; Abdominal pain   Oxycodone Palpitations   Tramadol Nausea And Vomiting    Patient Measurements:   Heparin Dosing Weight: 95 kg  Vital Signs: BP: 144/88 (08/17 2230) Pulse Rate: 92 (08/17 2230)  Labs: Recent Labs    10/22/20 1652  HGB 9.0*  HCT 29.6*  PLT 530*  CREATININE 1.37*    Estimated Creatinine Clearance: 67.3 mL/min (A) (by C-G formula based on SCr of  1.37 mg/dL (H)).   Medical History: Past Medical History:  Diagnosis Date   Acute back pain with sciatica, left    Acute back pain with sciatica, right    AKI (acute kidney injury) ([place])    Anemia, unspecified    Cancer (HCC)    Carcinoid tumor of duodenum    Chronic pain    Chronic systolic CHF (congestive heart failure) (HCC)    Diabetes mellitus    Esophageal reflux    Fibromyalgia    Gastric ulcer    Gastroparesis    Gout    Hyperlipidemia    Hypertension    Lumbosacral stenosis    NICM (nonischemic cardiomyopathy) (HCC)    Obesity    PAF (paroxysmal atrial fibrillation) ([place])    Stroke ([place]) 02/2011   Vitamin B12 deficiency anemia     Medications:  PTA Eliquis 5mg  BID - LD on 8/15 @ 0830  Assessment: 29 yr admitted with abdominal pain, nausea/vomiting.  History of chronic abdominal pain and diabetic gastroparesis. Last dose of Eliquis taken on 8/15; unable to take since then due to intractable nausea/vomiting Pharmacy consulted to dose IV heparin while Eliquis is held  Goal of Therapy:  Heparin level 0.3-0.7 units/ml aPTT 66-102 seconds Monitor platelets by anticoagulation protocol: Yes   Plan:  Obtain baseline aPTT and HL.  If HL elevated, will monitor heparin therapy with aPTT's until effects of apixaban have worn off and the two labs correlate Heparin 4000  unit IV bolus x 1 followed by heparin gtt @ 1550 units/hr Check aPTT/HL 8 hr after heparin started Daily heparin level & CBC  Rilla Buckman, Toribio Harbour, PharmD 10/23/2020,1:02 AM  ADDENDUM: Baseline labs:  Heparin Level < 0.1 and aPTT = 42 sec; therefore will monitor heparin therapy with heparin levels only.  Leone Haven, PharmD 10/23/20 @ 02:09

## 2020-10-23 NOTE — Progress Notes (Signed)
PROGRESS NOTE  Deborah [name] TIW:[id number] DOB: Oct 30, 1965 DOA: 10/22/2020 PCP: Cena Benton, MD  Brief History   55 year old woman PMH including diabetic gastroparesis, carcinoid tumor of the duodenum status postresection, peptic ulcer disease, diabetes mellitus type 2 presented with generalized abdominal pain nausea and vomiting.  Admitted for intractable abdominal pain nausea and vomiting secondary to diabetic gastroparesis.  A & P  Intractable abdominal pain and nausea/vomiting --Exam benign, imaging with CT nonacute, LFTs and lipase unremarkable.  No signs or symptoms of infection.  Favor diabetic gastroparesis flare --Intolerant to Reglan secondary to extrapyramidal symptoms. --Somewhat better today, tolerated liquids.  Wants to advance diet.  Continue supportive care, full liquid diet for now, PPI --We discussed the relationship between narcotics and gastroparesis  Mild hypokalemia -- Replete.  Magnesium was within normal limits.   CKD stage IIIb -- Creatinine better than baseline.  Monitor clinically.   Insulin-dependent type 2 diabetes A1c 7.8 on 10/08/2020. --Stable CBG.  Anion gap within normal limits.  Continue sliding scale insulin.   -- Resume basal insulin when diet is significantly advanced.   Chronic systolic CHF. EF 45 to 50% on echo done May 2021.  --Monitor volume status, no evidence of volume overload.   Chronic anemia, leukocytosis, thrombocytosis --Appears stable.   Paroxysmal A. Fib --Tolerating diet.  Resume apixaban.  Disposition Plan:  Discussion:   Status is: Observation  The patient remains OBS appropriate and will d/c before 2 midnights.  Dispo: The patient is from: Home              Anticipated d/c is to: Home              Patient currently is not medically stable to d/c.   Difficult to place patient No DVT prophylaxis: apixaban apixaban (ELIQUIS) tablet 5 mg   Code Status: Full Code Level of care: Telemetry Family  Communication: none  Murray Hodgkins, MD  [facility] Direct contact: see [email or web address] (further directions at bottom of note if needed) 7PM-7AM contact night coverage as at bottom of note 10/23/2020, 6:23 PM  LOS: 0 days   Interval History/Subjective  CC: f/u abd pain  Nausea today but no vomiting.  Abdominal pain a bit better today.  Objective   Vitals:  Vitals:   10/23/20 1440 10/23/20 1521  BP: (!) 127/91 126/61  Pulse: 98 97  Resp: 16   Temp: 97.6 F (36.4 C)   SpO2: 100% 100%    Exam: Physical Exam Vitals reviewed.  Constitutional:      General: She is not in acute distress.    Appearance: She is not toxic-appearing.     Comments: Appears to be in mild pain  Cardiovascular:     Rate and Rhythm: Normal rate and regular rhythm.     Heart sounds: No murmur heard. Pulmonary:     Effort: Pulmonary effort is normal. No respiratory distress.     Breath sounds: Normal breath sounds. No wheezing or rales.  Abdominal:     General: There is no distension.     Palpations: Abdomen is soft. There is no mass.     Tenderness: There is abdominal tenderness (mild tenderness generally). There is no guarding.  Neurological:     Mental Status: She is alert.  Psychiatric:        Mood and Affect: Mood normal.        Behavior: Behavior normal.    I have personally reviewed the labs and other data, making special note of:  Today's Data  CBG stable K+ 2.9 Hgb 8   Scheduled Meds:  apixaban  5 mg Oral BID   DULoxetine HCl  1 capsule Oral BID   hydrALAZINE  50 mg Oral TID   insulin aspart  0-9 Units Subcutaneous Q4H   isosorbide mononitrate  60 mg Oral Daily   metoprolol succinate  25 mg Oral Daily   pantoprazole (PROTONIX) IV  40 mg Intravenous Q12H   scopolamine  1 patch Transdermal Q72H   Continuous Infusions:    Principal Problem:   Intractable nausea and vomiting Active Problems:   Abdominal pain   Essential hypertension, benign   Chronic kidney disease,  stage 3b (HCC)   Chronic systolic CHF (congestive heart failure) ([place])   Uncontrolled type 2 diabetes mellitus with diabetic neuropathy, with long-term current use of insulin (HCC)   Type 2 diabetes mellitus with hyperglycemia, with long-term current use of insulin (HCC)   Diabetic gastroparesis ([place])   Uncontrolled type 2 diabetes mellitus with hyperglycemia (HCC)   PAF (paroxysmal atrial fibrillation) ([place])   LOS: 0 days   How to contact the [facility] Attending or Consulting provider 7A - 7P or covering provider during after hours 7P -7A, for this patient?  Check the care team in [facility] and look for a) attending/consulting TRH provider listed and b) the [facility] team listed Log into [email or web address] and use [place]'s universal password to access. If you do not have the password, please contact the hospital operator. Locate the [facility] provider you are looking for under [facility] and page to a number that you can be directly reached. If you still have difficulty reaching the provider, please page the [facility] (Director on Call) for the Hospitalists listed on amion for assistance.

## 2020-10-23 NOTE — Progress Notes (Signed)
ANTICOAGULATION CONSULT NOTE - Initial Consult  Pharmacy Consult for Heparin Indication: atrial fibrillation (on Eliquis PTA)  Extensive Allergy List  Patient Measurements:   Heparin Dosing Weight: 95 kg  Vital Signs: Temp: 97.7 F (36.5 C) (08/18 0730) Temp Source: Oral (08/18 0730) BP: 145/84 (08/18 1000) Pulse Rate: 99 (08/18 1000)  Labs: Recent Labs    10/22/20 1652 10/23/20 0116 10/23/20 0506 10/23/20 0948  HGB 9.0*  --  8.0*  --   HCT 29.6*  --  26.5*  --   PLT 530*  --  435*  --   APTT  --  42*  --   --   HEPARINUNFRC  --  <0.10*  --  0.15*  CREATININE 1.37*  --  1.18*  --     Estimated Creatinine Clearance: 78.2 mL/min (A) (by C-G formula based on SCr of 1.18 mg/dL (H)).  Medications:  PTA Eliquis 5mg  BID - LD on 8/15 @ 0830, for Afib  Assessment: 55 yr admitted with abdominal pain, nausea/vomiting.  History of chronic abdominal pain and diabetic gastroparesis. Last dose of Eliquis taken on 8/15; unable to take since then due to intractable nausea/vomiting Pharmacy consulted to dose IV heparin while Eliquis is held Baseline Hep level < 0.1, aPTT 42 sec: Apixaban effect has worn off, use Hep levels only Heparin 4000 unit IV bolus x 1 followed by heparin gtt @ 1550 units/hr  Goal of Therapy:  Heparin level 0.3-0.7 units/ml aPTT 66-102 seconds Monitor platelets by anticoagulation protocol: Yes  Today, 10/23/2020 0948 Hep level 0.15 units/ml. Heparin infusing into L arm, lab drawn from R am   Plan:  Rebolus Heparin with 2500 units, increase rate to 1800 units/hr Recheck Heparin level in 6 hr Daily CBC and Heparin levels ordered  Minda Ditto PharmD [facility] Rx [id number] 10/23/2020, 11:23 AM

## 2020-10-24 ENCOUNTER — Encounter: Payer: Medicare Other | Admitting: Physical Medicine and Rehabilitation

## 2020-10-24 ENCOUNTER — Other Ambulatory Visit: Payer: Self-pay

## 2020-10-24 DIAGNOSIS — I130 Hypertensive heart and chronic kidney disease with heart failure and stage 1 through stage 4 chronic kidney disease, or unspecified chronic kidney disease: Secondary | ICD-10-CM | POA: Diagnosis present

## 2020-10-24 DIAGNOSIS — E872 Acidosis: Secondary | ICD-10-CM | POA: Diagnosis present

## 2020-10-24 DIAGNOSIS — I480 Paroxysmal atrial fibrillation: Secondary | ICD-10-CM | POA: Diagnosis present

## 2020-10-24 DIAGNOSIS — R112 Nausea with vomiting, unspecified: Secondary | ICD-10-CM | POA: Diagnosis not present

## 2020-10-24 DIAGNOSIS — N1832 Chronic kidney disease, stage 3b: Secondary | ICD-10-CM | POA: Diagnosis not present

## 2020-10-24 DIAGNOSIS — K3184 Gastroparesis: Secondary | ICD-10-CM | POA: Diagnosis not present

## 2020-10-24 DIAGNOSIS — E861 Hypovolemia: Secondary | ICD-10-CM | POA: Diagnosis present

## 2020-10-24 DIAGNOSIS — Z7901 Long term (current) use of anticoagulants: Secondary | ICD-10-CM | POA: Diagnosis not present

## 2020-10-24 DIAGNOSIS — E1143 Type 2 diabetes mellitus with diabetic autonomic (poly)neuropathy: Secondary | ICD-10-CM | POA: Diagnosis present

## 2020-10-24 DIAGNOSIS — D75839 Thrombocytosis, unspecified: Secondary | ICD-10-CM | POA: Diagnosis present

## 2020-10-24 DIAGNOSIS — I428 Other cardiomyopathies: Secondary | ICD-10-CM | POA: Diagnosis present

## 2020-10-24 DIAGNOSIS — M797 Fibromyalgia: Secondary | ICD-10-CM | POA: Diagnosis present

## 2020-10-24 DIAGNOSIS — E876 Hypokalemia: Secondary | ICD-10-CM | POA: Diagnosis present

## 2020-10-24 DIAGNOSIS — K7460 Unspecified cirrhosis of liver: Secondary | ICD-10-CM | POA: Diagnosis present

## 2020-10-24 DIAGNOSIS — Z794 Long term (current) use of insulin: Secondary | ICD-10-CM | POA: Diagnosis not present

## 2020-10-24 DIAGNOSIS — Z8249 Family history of ischemic heart disease and other diseases of the circulatory system: Secondary | ICD-10-CM | POA: Diagnosis not present

## 2020-10-24 DIAGNOSIS — D490 Neoplasm of unspecified behavior of digestive system: Secondary | ICD-10-CM | POA: Diagnosis present

## 2020-10-24 DIAGNOSIS — E785 Hyperlipidemia, unspecified: Secondary | ICD-10-CM | POA: Diagnosis present

## 2020-10-24 DIAGNOSIS — Z20822 Contact with and (suspected) exposure to covid-19: Secondary | ICD-10-CM | POA: Diagnosis present

## 2020-10-24 DIAGNOSIS — D72829 Elevated white blood cell count, unspecified: Secondary | ICD-10-CM | POA: Diagnosis present

## 2020-10-24 DIAGNOSIS — I5022 Chronic systolic (congestive) heart failure: Secondary | ICD-10-CM | POA: Diagnosis present

## 2020-10-24 DIAGNOSIS — D631 Anemia in chronic kidney disease: Secondary | ICD-10-CM | POA: Diagnosis present

## 2020-10-24 DIAGNOSIS — E1122 Type 2 diabetes mellitus with diabetic chronic kidney disease: Secondary | ICD-10-CM | POA: Diagnosis present

## 2020-10-24 DIAGNOSIS — R109 Unspecified abdominal pain: Secondary | ICD-10-CM | POA: Diagnosis not present

## 2020-10-24 DIAGNOSIS — E1165 Type 2 diabetes mellitus with hyperglycemia: Secondary | ICD-10-CM | POA: Diagnosis present

## 2020-10-24 LAB — BASIC METABOLIC PANEL
Anion gap: 6 (ref 5–15)
BUN: 14 mg/dL (ref 6–20)
CO2: 19 mmol/L — ABNORMAL LOW (ref 22–32)
Calcium: 8.1 mg/dL — ABNORMAL LOW (ref 8.9–10.3)
Chloride: 115 mmol/L — ABNORMAL HIGH (ref 98–111)
Creatinine, Ser: 1.27 mg/dL — ABNORMAL HIGH (ref 0.44–1.00)
GFR, Estimated: 50 mL/min — ABNORMAL LOW (ref >60)
Glucose, Bld: 150 mg/dL — ABNORMAL HIGH (ref 70–99)
Potassium: 3.7 mmol/L (ref 3.5–5.1)
Sodium: 140 mmol/L (ref 135–145)

## 2020-10-24 LAB — GLUCOSE, CAPILLARY
Glucose-Capillary: 147 mg/dL — ABNORMAL HIGH (ref 70–99)
Glucose-Capillary: 154 mg/dL — ABNORMAL HIGH (ref 70–99)
Glucose-Capillary: 175 mg/dL — ABNORMAL HIGH (ref 70–99)
Glucose-Capillary: 213 mg/dL — ABNORMAL HIGH (ref 70–99)
Glucose-Capillary: 221 mg/dL — ABNORMAL HIGH (ref 70–99)
Glucose-Capillary: 227 mg/dL — ABNORMAL HIGH (ref 70–99)
Glucose-Capillary: 233 mg/dL — ABNORMAL HIGH (ref 70–99)

## 2020-10-24 LAB — MAGNESIUM: Magnesium: 1.2 mg/dL — ABNORMAL LOW (ref 1.7–2.4)

## 2020-10-24 MED ORDER — HYDROXYZINE HCL 25 MG PO TABS
25.0000 mg | ORAL_TABLET | Freq: Once | ORAL | Status: AC
Start: 2020-10-25 — End: 2020-10-24
  Administered 2020-10-24: 25 mg via ORAL
  Filled 2020-10-24: qty 1

## 2020-10-24 MED ORDER — HYDROMORPHONE HCL 1 MG/ML IJ SOLN
0.5000 mg | Freq: Once | INTRAMUSCULAR | Status: AC
  Administered 2020-10-24: 0.5 mg via INTRAVENOUS
  Filled 2020-10-24: qty 0.5

## 2020-10-24 MED ORDER — PROCHLORPERAZINE EDISYLATE 10 MG/2ML IJ SOLN
10.0000 mg | Freq: Four times a day (QID) | INTRAMUSCULAR | Status: DC | PRN
  Administered 2020-10-24 (×2): 10 mg via INTRAVENOUS
  Filled 2020-10-24 (×2): qty 2

## 2020-10-24 MED ORDER — MAGNESIUM SULFATE 2 GM/50ML IV SOLN
2.0000 g | Freq: Once | INTRAVENOUS | Status: AC
  Administered 2020-10-24: 2 g via INTRAVENOUS
  Filled 2020-10-24: qty 50

## 2020-10-24 MED ORDER — HYDROMORPHONE HCL 2 MG PO TABS
2.0000 mg | ORAL_TABLET | Freq: Four times a day (QID) | ORAL | Status: DC | PRN
  Administered 2020-10-24 – 2020-10-25 (×4): 2 mg via ORAL
  Filled 2020-10-24 (×4): qty 1

## 2020-10-24 NOTE — Progress Notes (Signed)
PROGRESS NOTE  Deborah [name] TKW:[id number] DOB: Sep 10, 1965 DOA: 10/22/2020 PCP: Cena Benton, MD  Brief History   55 year old woman PMH including diabetic gastroparesis, carcinoid tumor of the duodenum status postresection, peptic ulcer disease, diabetes mellitus type 2 presented with generalized abdominal pain nausea and vomiting.  Admitted for intractable abdominal pain nausea and vomiting secondary to diabetic gastroparesis.  A & P  Intractable abdominal pain and nausea/vomiting -- Exam remains benign, imaging was nonacute as were LFTs and lipase.  No signs or symptoms of infection. --Working diagnosis is gastroparesis flare.  Intolerant to Reglan secondary to extraparametal symptoms. --Difficult to gauge pain, she appears comfortable until her attention is drawn to her abdomen.  Reports allergies to pain medications except for Dilaudid.  She does take oral Dilaudid as an outpatient which has been verified. --Discussed the need to stop IV narcotics, no indication given lack of vomiting. --Downgraded to liquid diet given nausea.  Oral Dilaudid as needed for pain.  Mild hypokalemia -- Repleted.   Hypomagnesemia --Replete, check in a.m.   CKD stage IIIb -- Creatinine better than baseline   Insulin-dependent type 2 diabetes A1c 7.8 on 10/08/2020. --CBG stable overall.  Anion gap within normal limits.  Continue sliding scale insulin.   -- Resume basal insulin when diet is significantly advanced.   Chronic systolic CHF. EF 45 to 50% on echo done May 2021.  --Monitor volume status, no evidence of volume overload.   Chronic anemia, leukocytosis, thrombocytosis --Appears stable.   Paroxysmal A. Fib --Tolerating diet.  Resume apixaban.  Disposition Plan:  Discussion: Given poor diet tolerance, ongoing pain, remain hospitalized.  Home when tolerating diet.  Dispo: The patient is from: Home              Anticipated d/c is to: Home              Patient currently is  not medically stable to d/c.   Difficult to place patient No DVT prophylaxis: apixaban apixaban (ELIQUIS) tablet 5 mg    Code Status: Full Code Level of care: Telemetry Family Communication: none  Murray Hodgkins, MD  [facility] Direct contact: see [email or web address] (further directions at bottom of note if needed) 7PM-7AM contact night coverage as at bottom of note 10/24/2020, 4:20 PM  LOS: 0 days   Interval History/Subjective  CC: f/u abd pain  This morning patient asked to advance diet to soft, later in the morning she complained of abdominal pain and wanting increased narcotics  Currently she reports nausea but no vomiting.  Did not want to eat this morning she reports.  She reports allergies to NSAIDs, tramadol, acetaminophen.  She reports the only medication she can take for pain is Dilaudid either IV or oral.  Objective   Vitals:  Vitals:   10/24/20 0347 10/24/20 1356  BP: 124/65 (!) 143/86  Pulse: 94 98  Resp: 18 18  Temp: 98.4 F (36.9 C) 98 F (36.7 C)  SpO2: 100% 98%    Exam: Physical Exam Vitals reviewed.  Constitutional:      Comments: Resting comfortably upon entrance into the room.  After being awoken she seems to indicate sudden pain.  Cardiovascular:     Rate and Rhythm: Normal rate and regular rhythm.     Heart sounds: No murmur heard. Pulmonary:     Effort: Pulmonary effort is normal. No respiratory distress.     Breath sounds: Normal breath sounds. No wheezing.  Abdominal:     General: There is no distension.  Tenderness: There is no guarding.     Comments: Perhaps mild generalized tenderness although she is distractible and seems to have less pain with stethoscope pressure.  Neurological:     Mental Status: She is alert.    I have personally reviewed the labs and other data, making special note of:   Today's Data  CBG stable, potassium 3.7, creatinine 1.27, stable   Scheduled Meds:  apixaban  5 mg Oral BID   DULoxetine  40 mg Oral  BID   hydrALAZINE  50 mg Oral TID   insulin aspart  0-9 Units Subcutaneous Q4H   isosorbide mononitrate  60 mg Oral Daily   metoprolol succinate  25 mg Oral Daily   pantoprazole (PROTONIX) IV  40 mg Intravenous Q12H   scopolamine  1 patch Transdermal Q72H   Continuous Infusions:    Principal Problem:   Intractable nausea and vomiting Active Problems:   Abdominal pain   Essential hypertension, benign   Chronic kidney disease, stage 3b (HCC)   Chronic systolic CHF (congestive heart failure) ([place])   Uncontrolled type 2 diabetes mellitus with diabetic neuropathy, with long-term current use of insulin (HCC)   Type 2 diabetes mellitus with hyperglycemia, with long-term current use of insulin (HCC)   Diabetic gastroparesis ([place])   Uncontrolled type 2 diabetes mellitus with hyperglycemia (HCC)   PAF (paroxysmal atrial fibrillation) ([place])   LOS: 0 days   How to contact the [facility] Attending or Consulting provider 7A - 7P or covering provider during after hours 7P -7A, for this patient?  Check the care team in [facility] and look for a) attending/consulting TRH provider listed and b) the [facility] team listed Log into [email or web address] and use [place]'s universal password to access. If you do not have the password, please contact the hospital operator. Locate the [facility] provider you are looking for under [facility] and page to a number that you can be directly reached. If you still have difficulty reaching the provider, please page the [facility] (Director on Call) for the Hospitalists listed on amion for assistance.

## 2020-10-25 DIAGNOSIS — I5022 Chronic systolic (congestive) heart failure: Secondary | ICD-10-CM

## 2020-10-25 LAB — BASIC METABOLIC PANEL
Anion gap: 7 (ref 5–15)
BUN: 10 mg/dL (ref 6–20)
CO2: 21 mmol/L — ABNORMAL LOW (ref 22–32)
Calcium: 8.9 mg/dL (ref 8.9–10.3)
Chloride: 111 mmol/L (ref 98–111)
Creatinine, Ser: 1.12 mg/dL — ABNORMAL HIGH (ref 0.44–1.00)
GFR, Estimated: 58 mL/min — ABNORMAL LOW (ref >60)
Glucose, Bld: 244 mg/dL — ABNORMAL HIGH (ref 70–99)
Potassium: 3.5 mmol/L (ref 3.5–5.1)
Sodium: 139 mmol/L (ref 135–145)

## 2020-10-25 LAB — GLUCOSE, CAPILLARY
Glucose-Capillary: 155 mg/dL — ABNORMAL HIGH (ref 70–99)
Glucose-Capillary: 211 mg/dL — ABNORMAL HIGH (ref 70–99)
Glucose-Capillary: 214 mg/dL — ABNORMAL HIGH (ref 70–99)
Glucose-Capillary: 220 mg/dL — ABNORMAL HIGH (ref 70–99)
Glucose-Capillary: 261 mg/dL — ABNORMAL HIGH (ref 70–99)

## 2020-10-25 LAB — PHOSPHORUS: Phosphorus: 2.1 mg/dL — ABNORMAL LOW (ref 2.5–4.6)

## 2020-10-25 LAB — MAGNESIUM: Magnesium: 1.4 mg/dL — ABNORMAL LOW (ref 1.7–2.4)

## 2020-10-25 MED ORDER — MAGNESIUM SULFATE 4 GM/100ML IV SOLN
4.0000 g | Freq: Once | INTRAVENOUS | Status: AC
  Administered 2020-10-25: 4 g via INTRAVENOUS
  Filled 2020-10-25: qty 100

## 2020-10-25 MED ORDER — INSULIN ASPART 100 UNIT/ML IJ SOLN
0.0000 [IU] | Freq: Four times a day (QID) | INTRAMUSCULAR | Status: DC
  Administered 2020-10-25 (×2): 7 [IU] via SUBCUTANEOUS
  Administered 2020-10-26: 4 [IU] via SUBCUTANEOUS
  Administered 2020-10-26: 7 [IU] via SUBCUTANEOUS
  Administered 2020-10-26 (×2): 4 [IU] via SUBCUTANEOUS

## 2020-10-25 MED ORDER — POTASSIUM CHLORIDE 10 MEQ/100ML IV SOLN
10.0000 meq | INTRAVENOUS | Status: AC
  Administered 2020-10-25 (×4): 10 meq via INTRAVENOUS
  Filled 2020-10-25: qty 100

## 2020-10-25 MED ORDER — SPIRONOLACTONE 12.5 MG HALF TABLET
12.5000 mg | ORAL_TABLET | Freq: Every day | ORAL | Status: DC
  Administered 2020-10-25 – 2020-10-26 (×2): 12.5 mg via ORAL
  Filled 2020-10-25 (×3): qty 1

## 2020-10-25 MED ORDER — HYDROMORPHONE HCL 1 MG/ML IJ SOLN
0.5000 mg | INTRAMUSCULAR | Status: DC | PRN
Start: 2020-10-25 — End: 2020-10-26
  Administered 2020-10-25 – 2020-10-26 (×4): 0.5 mg via INTRAVENOUS
  Filled 2020-10-25 (×4): qty 0.5

## 2020-10-25 MED ORDER — HYDROMORPHONE HCL 2 MG PO TABS
2.0000 mg | ORAL_TABLET | Freq: Four times a day (QID) | ORAL | Status: DC | PRN
  Administered 2020-10-26: 2 mg via ORAL
  Filled 2020-10-25 (×2): qty 1

## 2020-10-25 MED ORDER — SODIUM CHLORIDE 0.9 % IV SOLN: INTRAVENOUS | Status: DC

## 2020-10-25 MED ORDER — TORSEMIDE 20 MG PO TABS
40.0000 mg | ORAL_TABLET | Freq: Every day | ORAL | Status: DC
  Administered 2020-10-25 – 2020-10-26 (×2): 40 mg via ORAL
  Filled 2020-10-25 (×2): qty 2

## 2020-10-25 NOTE — Assessment & Plan Note (Signed)
A1c 7.8 on 10/08/2020. --CBG stable overall.  Anion gap within normal limits.  Continue sliding scale insulin.   -- Resume basal insulin when diet is significantly advanced.

## 2020-10-25 NOTE — Assessment & Plan Note (Addendum)
--   Exam remains benign. Imaging and laboratory studies unremarkable.  No signs or symptoms of infection. -- Presumed gastroparesis flare.  Intolerant to Reglan. --She appears quite comfortable, tolerating liquids, not complaining of any nausea tolerating. -- We will advance diet, stop IV narcotics, encourage patient to use antiemetic if has nausea.  Anticipate discharge tomorrow if continues to improve.

## 2020-10-25 NOTE — Assessment & Plan Note (Signed)
-  replete °

## 2020-10-25 NOTE — Assessment & Plan Note (Signed)
stable °

## 2020-10-25 NOTE — Assessment & Plan Note (Signed)
--  cotninue apixaban and metoprolol

## 2020-10-25 NOTE — Assessment & Plan Note (Signed)
--   As documented under nausea or vomiting.

## 2020-10-25 NOTE — Progress Notes (Signed)
PROGRESS NOTE  Deborah WEEDMAN IDP:[id number] DOB: [birth year] DOA: 10/22/2020 PCP: Cena Benton, MD  Brief History   55 year old woman PMH including diabetic gastroparesis, carcinoid tumor of the duodenum status postresection, peptic ulcer disease, diabetes mellitus type 2 presented with generalized abdominal pain nausea and vomiting.  Admitted for intractable abdominal pain nausea and vomiting secondary to diabetic gastroparesis.  A & P  Intractable nausea and vomiting -- Exam benign, imaging and laboratory studies unremarkable.  No signs or symptoms of infection. -- Continues to appear to be gastroparesis flare.  Intolerant to Reglan. -- Difficult to gauge, reports nausea and inability to take oral nutrition however has not requested antiemetics this morning. -- Again discussed relationship of narcotics to gastroparesis.  Diet remains downgraded, not tolerating oral at this point, needs to use antibiotics.  Minimize IV pain medication.  Abdominal pain -- As documented under nausea or vomiting.  Chronic systolic CHF (congestive heart failure) (HCC) -- Appears stable.  Continue Imdur, metoprolol.  Resume spironolactone and torsemide.  Chronic kidney disease, stage 3b (HCC) --stable  Uncontrolled type 2 diabetes mellitus with diabetic neuropathy, with long-term current use of insulin (HCC) A1c 7.8 on 10/08/2020. --CBG stable overall.  Anion gap within normal limits.  Continue sliding scale insulin.   -- Resume basal insulin when diet is significantly advanced.  PAF (paroxysmal atrial fibrillation) (HCC) --cotninue apixaban and metoprolol  Hypomagnesemia --replete   Disposition Plan:  Discussion: Given poor diet tolerance, ongoing pain, remain hospitalized.     Dispo: The patient is from: Home              Anticipated d/c is to: Home              Patient currently is not medically stable to d/c.   Difficult to place patient No DVT prophylaxis: apixaban apixaban  (ELIQUIS) tablet 5 mg    Code Status: Full Code Level of care: Telemetry Family Communication: none  Murray Hodgkins, MD  [facility] Direct contact: see [email or web address] (further directions at bottom of note if needed) 7PM-7AM contact night coverage as at bottom of note 10/25/2020, 4:14 PM  LOS: 1 day   Interval History/Subjective  CC: f/u abd pain  Reports nausea but did not ask for any antiemetics per nursing.  No vomiting.  Reports she cannot drink.  Compazine was ineffective.  Did not order any breakfast.  Reports oral analgesia ineffective.  Objective   Vitals:  Vitals:   10/24/20 2052 10/25/20 0429  BP: (!) 155/87 (!) 178/104  Pulse: 94 (!) 108  Resp: 17 16  Temp: 98.4 F (36.9 C) 97.6 F (36.4 C)  SpO2: 100% 96%    Exam: Physical Exam Constitutional:      Appearance: Normal appearance. She is not ill-appearing.  Cardiovascular:     Rate and Rhythm: Normal rate and regular rhythm.     Heart sounds: No murmur heard. Pulmonary:     Effort: Pulmonary effort is normal.     Breath sounds: Normal breath sounds. No wheezing.  Abdominal:     General: There is no distension.     Tenderness: There is no abdominal tenderness. There is no guarding.  Neurological:     Mental Status: She is alert.  Psychiatric:        Mood and Affect: Mood normal.        Behavior: Behavior normal.    I have personally reviewed the labs and other data, making special note of:   Today's Data  CBG remained  stable.  BMP unremarkable.  Potassium 3.5.  Magnesium 1.4.   Scheduled Meds:  apixaban  5 mg Oral BID   DULoxetine  40 mg Oral BID   hydrALAZINE  50 mg Oral TID   insulin aspart  0-20 Units Subcutaneous Q6H   isosorbide mononitrate  60 mg Oral Daily   metoprolol succinate  25 mg Oral Daily   pantoprazole (PROTONIX) IV  40 mg Intravenous Q12H   scopolamine  1 patch Transdermal Q72H   spironolactone  12.5 mg Oral Daily   torsemide  40 mg Oral Daily   Continuous Infusions:   sodium chloride 50 mL/hr at 10/25/20 1312   potassium chloride 10 mEq (10/25/20 1315)     Principal Problem:   Intractable nausea and vomiting Active Problems:   Abdominal pain   Essential hypertension, benign   Chronic kidney disease, stage 3b (HCC)   Chronic systolic CHF (congestive heart failure) ([place])   Uncontrolled type 2 diabetes mellitus with diabetic neuropathy, with long-term current use of insulin (HCC)   Type 2 diabetes mellitus with hyperglycemia, with long-term current use of insulin (HCC)   Hypomagnesemia   Diabetic gastroparesis ([place])   Uncontrolled type 2 diabetes mellitus with hyperglycemia (HCC)   PAF (paroxysmal atrial fibrillation) ([place])   LOS: 1 day   How to contact the [facility] Attending or Consulting provider 7A - 7P or covering provider during after hours 7P -7A, for this patient?  Check the care team in [facility] and look for a) attending/consulting TRH provider listed and b) the [facility] team listed Log into [email or web address] and use [place]'s universal password to access. If you do not have the password, please contact the hospital operator. Locate the [facility] provider you are looking for under [facility] and page to a number that you can be directly reached. If you still have difficulty reaching the provider, please page the [facility] (Director on Call) for the Hospitalists listed on amion for assistance.

## 2020-10-25 NOTE — Assessment & Plan Note (Addendum)
--   Appears stable.  Continue Imdur, metoprolol.  Continue spironolactone and torsemide.

## 2020-10-26 LAB — BASIC METABOLIC PANEL
Anion gap: 6 (ref 5–15)
BUN: 10 mg/dL (ref 6–20)
CO2: 22 mmol/L (ref 22–32)
Calcium: 8.5 mg/dL — ABNORMAL LOW (ref 8.9–10.3)
Chloride: 108 mmol/L (ref 98–111)
Creatinine, Ser: 1.5 mg/dL — ABNORMAL HIGH (ref 0.44–1.00)
GFR, Estimated: 41 mL/min — ABNORMAL LOW (ref >60)
Glucose, Bld: 170 mg/dL — ABNORMAL HIGH (ref 70–99)
Potassium: 3.2 mmol/L — ABNORMAL LOW (ref 3.5–5.1)
Sodium: 136 mmol/L (ref 135–145)

## 2020-10-26 LAB — MAGNESIUM: Magnesium: 2.1 mg/dL (ref 1.7–2.4)

## 2020-10-26 LAB — GLUCOSE, CAPILLARY
Glucose-Capillary: 166 mg/dL — ABNORMAL HIGH (ref 70–99)
Glucose-Capillary: 188 mg/dL — ABNORMAL HIGH (ref 70–99)
Glucose-Capillary: 231 mg/dL — ABNORMAL HIGH (ref 70–99)
Glucose-Capillary: 189 mg/dL — ABNORMAL HIGH (ref 70–99)

## 2020-10-26 MED ORDER — PANTOPRAZOLE SODIUM 40 MG PO TBEC: 40.0000 mg | DELAYED_RELEASE_TABLET | Freq: Two times a day (BID) | ORAL | Status: DC

## 2020-10-26 NOTE — Progress Notes (Signed)
Patient discharged to home with family. Patient refused to wait for doctor to complete discharge paperwork and left via wheelchair.

## 2020-10-26 NOTE — Discharge Summary (Signed)
Physician Discharge Summary  Deborah [name] OHY:[id number] DOB: Nov 09, 1965 DOA: 10/22/2020  PCP: Cena Benton, MD  Admit date: 10/22/2020 Discharge date: 10/26/2020  Recommendations for Outpatient Follow-up:  Ongoing care for diabetic gastroparesis, diabetes mellitus type 2 Ongoing care for chronic pain management   Follow-up Information     Cena Benton, MD Follow up in 1 week(s).   Specialty: Internal Medicine Contact information: [address] [place] [address] [phone]                  Discharge Diagnoses: Principal diagnosis is #1 Principal Problem:   Intractable nausea and vomiting Active Problems:   Abdominal pain   Essential hypertension, benign   Chronic kidney disease, stage 3b (HCC)   Chronic systolic CHF (congestive heart failure) ([place])   Uncontrolled type 2 diabetes mellitus with diabetic neuropathy, with long-term current use of insulin (HCC)   Type 2 diabetes mellitus with hyperglycemia, with long-term current use of insulin (HCC)   Hypomagnesemia   Diabetic gastroparesis ([place])   Uncontrolled type 2 diabetes mellitus with hyperglycemia (HCC)   PAF (paroxysmal atrial fibrillation) ([place])   Discharge Condition: improved Disposition: home  Diet recommendation:  Diet Orders (From admission, onward)     Start     Ordered   10/26/20 1103  DIET SOFT Room service appropriate? Yes; Fluid consistency: Thin  Diet effective now       Question Answer Comment  Room service appropriate? Yes   Fluid consistency: Thin      10/26/20 1103             There were no vitals filed for this visit.  HPI/Hospital Course:   55 year old woman PMH including diabetic gastroparesis, carcinoid tumor of the duodenum status postresection, peptic ulcer disease, diabetes mellitus type 2 presented with generalized abdominal pain nausea and vomiting.  Admitted for intractable abdominal pai n nausea and vomiting  secondary to diabetic gastroparesis.  Extensive work-up was unrevealing.  Exam remains benign and inconsistent.  Was slow to improve.  Consistently reported nausea but rarely requested antiemetics.  No vomiting noted.  Able to tolerate medications and juice.  No indication for IV narcotics based on work-up.  Repeatedly requesting IV narcotics, "until tomorrow".  Diet advanced to soft, no antiemetics requested on day of discharge, no vomiting noted.  Patient requested discharge home.  No further inpatient evaluation suggested.  * Intractable nausea and vomiting secondary to gastroparesis flare. -- Imaging and laboratory studies unremarkable.  No signs or symptoms of infection.  CT abdomen and pelvis no acute abnormalities. -- Secondary to gastroparesis flare.  Intolerant to Reglan. -- Vomiting resolved, nausea appears resolved.  Abdominal pain superimposed on chronic pain (on oral Dilaudid at home) -- Inconsistent reporting. Benign examination.  No objective findings.  Secondary to gastroparesis flare.  Have discussed in detail with patient IV narcotics worsen gastroparesis. --Do not see any indication for IV narcotics  Chronic systolic CHF (congestive heart failure) ([place]) -- Stable.  Continue Imdur, metoprolol.  Continue spironolactone and torsemide.  Chronic kidney disease, stage 3b (HCC) --stable  PAF (paroxysmal atrial fibrillation) (HCC) --cotninue apixaban and metoprolol  Hypomagnesemia --repleted  Uncontrolled type 2 diabetes mellitus with diabetic neuropathy, with long-term current use of insulin (HCC) A1c 7.8 on 10/08/2020. --CBG stable overall.  Anion gap within normal limits.   --Resume home insulin as diet improves   Today's assessment: See progress note same day  Patient appears stable on discharge, I have reexamined, examine remains benign  Discharge Instructions  Discharge Instructions     Activity as tolerated - No restrictions   Complete by: As directed     Discharge instructions   Complete by: As directed    Call your physician or seek immediate medical attention for pain, fever, vomiting or worsening of condition. Eat small meals frequently, soft, bland      Allergies as of 10/26/2020       Reactions   Diazepam [name]   Gabapentin [name], Swelling   Other reaction(s): Unknown   Iodinated Diagnostic Agents Anaphylaxis   11/29/17 Cardiac arrest 1 min after IV contrast, possible allergy vs vasovagal episode Iopamidol  Anaphylaxis  High 11/28/2017  Patient had seizure like activity and then code post 100 cc of isovue 300     Isovue [iopamidol] Anaphylaxis   11/28/17 Patient had seizure like activity and then 1 min code after 100 cc of isovue 300. Possible contrast allergy vs vasovagal episode   Lisinopril Anaphylaxis   Tongue and mouth swelling   Metoclopramide Other (See Comments)   Tardive dyskinesia   Nsaids Anaphylaxis, Other (See Comments)   ULCER   Penicillins Palpitations   Has patient had a PCN reaction causing immediate rash, facial/tongue/throat swelling, SOB or lightheadedness with hypotension: Yes, heart races Has patient had a PCN reaction causing severe rash involving mucus membranes or skin necrosis: No Has patient had a PCN reaction that required hospitalization: Yes  Has patient had a PCN reaction occurring within the last 10 years: No   Tolmetin Nausea Only, Other (See Comments)   ULCER   Aspirin Other (See Comments)   Irritates stomach ulcer    Cyclobenzaprine    Other reaction(s): Unknown   Rifamycins    Acetaminophen Nausea Only, Other (See Comments)   Irritates stomach ulcer; Abdominal pain   Oxycodone Palpitations   Tramadol Nausea And Vomiting        Medication List     TAKE these medications    albuterol (2.5 MG/3ML) 0.083% nebulizer solution Commonly known as: PROVENTIL Take 3 mLs (2.5 mg total) by nebulization every 6 (six) hours as needed for wheezing or shortness of  breath.   atorvastatin 10 MG tablet Commonly known as: LIPITOR TAKE ONE TABLET BY MOUTH ONCE DAILY (AM)   blood glucose meter kit and supplies Kit Dispense based on patient and insurance preference. Use up to four times daily as directed. (FOR ICD-9 250.00, 250.01).   cetirizine 10 MG tablet Commonly known as: ZYRTEC Take 10 mg by mouth daily.   DULoxetine HCl 40 MG Cpep TAKE ONE CAPSULE BY MOUTH TWICE DAILY   Easy Comfort Pen Needles 31G X 5 MM Misc Generic drug: Insulin Pen Needle USE 3 TIMES A DAY FOR INSULIN ADMINISTRATION   Eliquis 5 MG Tabs tablet Generic drug: apixaban TAKE 1 TABLET BY MOUTH 2 (TWO) TIMES DAILY.   fluticasone 50 MCG/ACT nasal spray Commonly known as: FLONASE Place 2 sprays into both nostrils daily as needed for allergies or rhinitis.   hydrALAZINE 50 MG tablet Commonly known as: APRESOLINE Take 1 tablet (50 mg total) by mouth 3 (three) times daily.   isosorbide mononitrate 60 MG 24 hr tablet Commonly known as: IMDUR TAKE 1 TABLET (60 MG TOTAL) BY MOUTH DAILY.   Lantus SoloStar 100 UNIT/ML Solostar Pen Generic drug: insulin glargine Inject 40 Units into the skin daily. What changed:  how much to take when to take this   melatonin 3 MG Tabs tablet TAKE 2 TABLETS (6  MG TOTAL) BY MOUTH AT BEDTIME.   metoCLOPramide 5 MG tablet Commonly known as: REGLAN Take 5 mg by mouth 4 (four) times daily.   metoprolol succinate 25 MG 24 hr tablet Commonly known as: TOPROL-XL Take 25 mg by mouth daily. What changed: Another medication with the same name was removed. Continue taking this medication, and follow the directions you see here.   nitroGLYCERIN 0.4 MG SL tablet Commonly known as: NITROSTAT Place 1 tablet (0.4 mg total) under the tongue every 5 (five) minutes as needed for chest pain.   NovoLOG FlexPen 100 UNIT/ML FlexPen Generic drug: insulin aspart Inject 1-15 Units into the skin in the morning, at noon, in the evening, and at bedtime. CBG  121 - 150: 2 units, CBG 151 - 200: 3 units, CBG 201 - 250: 5 units, CBG 251 - 300: 8 units, CBG 301 - 350: 11 units, CBG 351 - 400: 15 units, CBG > 400 call MD   ondansetron 4 MG disintegrating tablet Commonly known as: Zofran ODT Take 1 tablet (4 mg total) by mouth every 8 (eight) hours as needed for nausea or vomiting.   pantoprazole 40 MG tablet Commonly known as: PROTONIX TAKE 1 TABLET (40 MG TOTAL) BY MOUTH 2 (TWO) TIMES DAILY.   promethazine 12.5 MG tablet Commonly known as: PHENERGAN Take 1 tablet (12.5 mg total) by mouth every 6 (six) hours as needed for nausea or vomiting.   spironolactone 25 MG tablet Commonly known as: ALDACTONE Take 12.5 mg by mouth daily.   torsemide 20 MG tablet Commonly known as: DEMADEX Take 2 tablets (40 mg total) by mouth daily.       Allergies  Allergen Reactions   Diazepam [name]   Gabapentin [name] and Swelling    Other reaction(s): Unknown   Iodinated Diagnostic Agents Anaphylaxis    11/29/17 Cardiac arrest 1 min after IV contrast, possible allergy vs vasovagal episode Iopamidol  Anaphylaxis  High 11/28/2017  Patient had seizure like activity and then code post 100 cc of isovue 300     Isovue [Iopamidol] Anaphylaxis    11/28/17 Patient had seizure like activity and then 1 min code after 100 cc of isovue 300. Possible contrast allergy vs vasovagal episode   Lisinopril Anaphylaxis    Tongue and mouth swelling   Metoclopramide Other (See Comments)    Tardive dyskinesia   Nsaids Anaphylaxis and Other (See Comments)    ULCER   Penicillins Palpitations    Has patient had a PCN reaction causing immediate rash, facial/tongue/throat swelling, SOB or lightheadedness with hypotension: Yes, heart races Has patient had a PCN reaction causing severe rash involving mucus membranes or skin necrosis: No Has patient had a PCN reaction that required hospitalization: Yes  Has patient had a PCN reaction occurring within the  last 10 years: No    Tolmetin Nausea Only and Other (See Comments)    ULCER   Aspirin Other (See Comments)    Irritates stomach ulcer    Cyclobenzaprine     Other reaction(s): Unknown   Rifamycins    Acetaminophen Nausea Only and Other (See Comments)    Irritates stomach ulcer; Abdominal pain   Oxycodone Palpitations   Tramadol Nausea And Vomiting    The results of significant diagnostics from this hospitalization (including imaging, microbiology, ancillary and laboratory) are listed below for reference.    Significant Diagnostic Studies: CT Abdomen Pelvis Wo Contrast  Result Date: 10/22/2020 CLINICAL DATA:  Bowel obstruction suspected.  Abd pain,  n/v, EXAM: CT ABDOMEN AND PELVIS WITHOUT CONTRAST TECHNIQUE: Multidetector CT imaging of the abdomen and pelvis was performed following the standard protocol without IV contrast. COMPARISON:  CT abdomen pelvis 09/14/2020. FINDINGS: Lower chest: No acute abnormality. Hepatobiliary: The liver is enlarged measuring up to 21.5 Cm. No focal liver abnormality. Status post cholecystectomy. No biliary dilatation. Pancreas: No focal lesion. Normal pancreatic contour. No surrounding inflammatory changes. No main pancreatic ductal dilatation. Spleen: Normal in size without focal abnormality. Adrenals/Urinary Tract: No adrenal nodule bilaterally. Bilateral kidneys enhance symmetrically. No hydronephrosis. No hydroureter. The urinary bladder is unremarkable. Stomach/Bowel: Stomach is within normal limits. No evidence of bowel wall thickening or dilatation. Appendix appears normal. Vascular/Lymphatic: No abdominal aorta or iliac aneurysm. Mild atherosclerotic plaque of the aorta and its branches. No abdominal, pelvic, or inguinal lymphadenopathy. Reproductive: Uterus and bilateral adnexa are unremarkable. Other: No intraperitoneal free fluid. No intraperitoneal free gas. No organized fluid collection. Musculoskeletal: No abdominal wall hernia or abnormality. No  suspicious lytic or blastic osseous lesions. No acute displaced fracture. Severe degenerative changes of the L5-S1 level. Grade 1 anterolisthesis of L4 on L5. IMPRESSION: 1. No acute intra-abdominal or intrapelvic abnormality with limited evaluation on this noncontrast study. 2. Hepatomegaly. 3.  Aortic Atherosclerosis (ICD10-I70.0). Electronically Signed   By: Iven Finn M.D.   On: 10/22/2020 18:09   DG Abd Acute W/Chest  Result Date: 10/07/2020 CLINICAL DATA:  Abdominal and chest pain for 3 hours. EXAM: DG ABDOMEN ACUTE WITH 1 VIEW CHEST COMPARISON:  Single-view of the chest and CT abdomen and pelvis 09/14/2020. FINDINGS: Single-view of the chest demonstrates clear lungs. Heart size is normal. No pneumothorax or pleural fluid. No acute or focal bony abnormality. Two views of the abdomen show no free intraperitoneal air. The bowel gas pattern is nonobstructive. No acute or focal bony abnormality. IMPRESSION: Negative exam. Electronically Signed   By: Inge Rise M.D.   On: 10/07/2020 14:04    Microbiology: Recent Results (from the past 240 hour(s))  Resp Panel by RT-PCR (Flu A&B, Covid) Nasopharyngeal Swab     Status: None   Collection Time: 10/22/20 11:07 PM   Specimen: Nasopharyngeal Swab; Nasopharyngeal(NP) swabs in vial transport medium  Result Value Ref Range Status   SARS Coronavirus 2 by RT PCR NEGATIVE NEGATIVE Final    Comment: (NOTE) SARS-CoV-2 target nucleic acids are NOT DETECTED.  The SARS-CoV-2 RNA is generally detectable in upper respiratory specimens during the acute phase of infection. The lowest concentration of SARS-CoV-2 viral copies this assay can detect is 138 copies/mL. A negative result does not preclude SARS-Cov-2 infection and should not be used as the sole basis for treatment or other patient management decisions. A negative result may occur with  improper specimen collection/handling, submission of specimen other than nasopharyngeal swab, presence of viral  mutation(s) within the areas targeted by this assay, and inadequate number of viral copies(<138 copies/mL). A negative result must be combined with clinical observations, patient history, and epidemiological information. The expected result is Negative.  Fact Sheet for Patients:  EntrepreneurPulse.com.au  Fact Sheet for Healthcare Providers:  IncredibleEmployment.be  This test is no t yet approved or cleared by the [place] FDA and  has been authorized for detection and/or diagnosis of SARS-CoV-2 by FDA under an Emergency Use Authorization (EUA). This EUA will remain  in effect (meaning this test can be used) for the duration of the COVID-19 declaration under Section 564(b)(1) of the Act, 21 U.S.C.section 360bbb-3(b)(1), unless the authorization is terminated  or revoked  sooner.       Influenza A by PCR NEGATIVE NEGATIVE Final   Influenza B by PCR NEGATIVE NEGATIVE Final    Comment: (NOTE) The Xpert Xpress SARS-CoV-2/FLU/RSV plus assay is intended as an aid in the diagnosis of influenza from Nasopharyngeal swab specimens and should not be used as a sole basis for treatment. Nasal washings and aspirates are unacceptable for Xpert Xpress SARS-CoV-2/FLU/RSV testing.  Fact Sheet for Patients: EntrepreneurPulse.com.au  Fact Sheet for Healthcare Providers: IncredibleEmployment.be  This test is not yet approved or cleared by the [place] FDA and has been authorized for detection and/or diagnosis of SARS-CoV-2 by FDA under an Emergency Use Authorization (EUA). This EUA will remain in effect (meaning this test can be used) for the duration of the COVID-19 declaration under Section 564(b)(1) of the Act, 21 U.S.C. section 360bbb-3(b)(1), unless the authorization is terminated or revoked.  Performed at [facility], [place] [address] [place], [place] [address]      Labs: Basic  Metabolic Panel: Recent Labs  Lab 10/22/20 1652 10/22/20 1717 10/23/20 0506 10/24/20 0705 10/25/20 0535 10/26/20 0444  NA 140  --  141 140 139 136  K 3.1*  --  2.9* 3.7 3.5 3.2*  CL 109  --  112* 115* 111 108  CO2 19*  --  19* 19* 21* 22  GLUCOSE 82  --  112* 150* 244* 170*  BUN 19  --  '16 14 10 10  ' CREATININE 1.37*  --  1.18* 1.27* 1.12* 1.50*  CALCIUM 8.7*  --  8.2* 8.1* 8.9 8.5*  MG  --  2.1  --  1.2* 1.4* 2.1  PHOS  --   --   --   --  2.1*  --    Liver Function Tests: Recent Labs  Lab 10/22/20 1652  AST 18  ALT 11  ALKPHOS 97  BILITOT 0.3  PROT 8.6*  ALBUMIN 3.3*   Recent Labs  Lab 10/22/20 1652  LIPASE 28   CBC: Recent Labs  Lab 10/22/20 1652 10/23/20 0506  WBC 12.8* 10.8*  NEUTROABS 9.1*  --   HGB 9.0* 8.0*  HCT 29.6* 26.5*  MCV 90.5 91.1  PLT 530* 435*   CBG: Recent Labs  Lab 10/25/20 2343 10/26/20 0601 10/26/20 0810 10/26/20 1216 10/26/20 1616  GLUCAP 155* 166* 188* 231* 189*    Principal Problem:   Intractable nausea and vomiting Active Problems:   Abdominal pain   Essential hypertension, benign   Chronic kidney disease, stage 3b (HCC)   Chronic systolic CHF (congestive heart failure) ([place])   Uncontrolled type 2 diabetes mellitus with diabetic neuropathy, with long-term current use of insulin (HCC)   Type 2 diabetes mellitus with hyperglycemia, with long-term current use of insulin (HCC)   Hypomagnesemia   Diabetic gastroparesis (HCC)   Uncontrolled type 2 diabetes mellitus with hyperglycemia (HCC)   PAF (paroxysmal atrial fibrillation) ([place])   Time coordinating discharge: 35 minutes  Signed:  Murray Hodgkins, MD  [facility]  10/26/2020, 5:51 PM

## 2020-10-26 NOTE — Progress Notes (Signed)
PROGRESS NOTE  SHANECIA HOGANSON XFG:[id number] DOB: 03-25-1965 DOA: 10/22/2020 PCP: Cena Benton, MD  Brief History   55 year old woman PMH including diabetic gastroparesis, carcinoid tumor of the duodenum status postresection, peptic ulcer disease, diabetes mellitus type 2 presented with generalized abdominal pain nausea and vomiting.  Admitted for intractable abdominal pain nausea and vomiting secondary to diabetic gastroparesis.  A & P  Intractable nausea and vomiting -- Exam remains benign. Imaging and laboratory studies unremarkable.  No signs or symptoms of infection. -- Presumed gastroparesis flare.  Intolerant to Reglan. --She appears quite comfortable, tolerating liquids, not complaining of any nausea tolerating. -- We will advance diet, stop IV narcotics, encourage patient to use antiemetic if has nausea.  Anticipate discharge tomorrow if continues to improve.  Abdominal pain -- As documented under nausea or vomiting.  Chronic systolic CHF (congestive heart failure) (HCC) -- Appears stable.  Continue Imdur, metoprolol.  Continue spironolactone and torsemide.  Chronic kidney disease, stage 3b (HCC) --stable  Uncontrolled type 2 diabetes mellitus with diabetic neuropathy, with long-term current use of insulin (HCC) A1c 7.8 on 10/08/2020. --CBG stable overall.  Anion gap within normal limits.  Continue sliding scale insulin.   -- Resume basal insulin when diet is significantly advanced.  PAF (paroxysmal atrial fibrillation) (HCC) --cotninue apixaban and metoprolol  Hypomagnesemia --replete   Disposition Plan:  Discussion: Given poor diet tolerance, ongoing pain, remain hospitalized.     Dispo: The patient is from: Home              Anticipated d/c is to: Home              Patient currently is not medically stable to d/c.   Difficult to place patient No DVT prophylaxis: apixaban apixaban (ELIQUIS) tablet 5 mg    Code Status: Full Code Level of care:  Telemetry Family Communication: none  Murray Hodgkins, MD  [facility] Direct contact: see [email or web address] (further directions at bottom of note if needed) 7PM-7AM contact night coverage as at bottom of note 10/26/2020, 5:02 PM  LOS: 2 days   Interval History/Subjective  CC: f/u abd pain  Just drinking juice.  No nausea reported to RN.  Pain stabilizing.  "Please do not stop IV Dilaudid yet".  Objective   Vitals:  Vitals:   10/26/20 0554 10/26/20 1426  BP: (!) 144/73 (!) 165/101  Pulse: 74 94  Resp: 20 16  Temp: 98 F (36.7 C) 97.7 F (36.5 C)  SpO2: 98% 100%    Exam: Physical Exam Constitutional:      General: She is not in acute distress.    Appearance: Normal appearance. She is not ill-appearing or toxic-appearing.  Cardiovascular:     Rate and Rhythm: Normal rate and regular rhythm.     Heart sounds: No murmur heard. Pulmonary:     Effort: Pulmonary effort is normal. No respiratory distress.     Breath sounds: Normal breath sounds. No wheezing or rales.  Abdominal:     General: There is no distension.     Tenderness: There is no abdominal tenderness.  Neurological:     Mental Status: She is alert.    I have personally reviewed the labs and other data, making special note of:   Today's Data  CBG stable K+ 3.2 Creatinine up to 1.5 Mg WNL  Scheduled Meds:  apixaban  5 mg Oral BID   DULoxetine  40 mg Oral BID   hydrALAZINE  50 mg Oral TID   insulin aspart  0-20 Units Subcutaneous Q6H   isosorbide mononitrate  60 mg Oral Daily   metoprolol succinate  25 mg Oral Daily   pantoprazole  40 mg Oral BID   scopolamine  1 patch Transdermal Q72H   spironolactone  12.5 mg Oral Daily   torsemide  40 mg Oral Daily   Continuous Infusions:     Principal Problem:   Intractable nausea and vomiting Active Problems:   Abdominal pain   Essential hypertension, benign   Chronic kidney disease, stage 3b (HCC)   Chronic systolic CHF (congestive heart failure)  ([place])   Uncontrolled type 2 diabetes mellitus with diabetic neuropathy, with long-term current use of insulin (HCC)   Type 2 diabetes mellitus with hyperglycemia, with long-term current use of insulin (HCC)   Hypomagnesemia   Diabetic gastroparesis ([place])   Uncontrolled type 2 diabetes mellitus with hyperglycemia (HCC)   PAF (paroxysmal atrial fibrillation) ([place])   LOS: 2 days   How to contact the [facility] Attending or Consulting provider 7A - 7P or covering provider during after hours 7P -7A, for this patient?  Check the care team in [facility] and look for a) attending/consulting [facility] provider listed and b) the [facility] team listed Log into [email or web address] and use [place]'s universal password to access. If you do not have the password, please contact the hospital operator. Locate the [facility] provider you are looking for under [facility] and page to a number that you can be directly reached. If you still have difficulty reaching the provider, please page the [facility] (Director on Call) for the Hospitalists listed on amion for assistance.

## 2020-10-30 ENCOUNTER — Encounter: Payer: Medicare Other | Admitting: Registered Nurse

## 2020-10-30 ENCOUNTER — Telehealth: Payer: Self-pay

## 2020-10-30 ENCOUNTER — Other Ambulatory Visit: Payer: Self-pay

## 2020-10-30 NOTE — Telephone Encounter (Signed)
Deborah [name] arrived too late for her appointment today.  She had to reschedule the appointment with [name].   Patient is out of the Hydromorphone 2 MG. Last filled on 08/27/2020 for #30. (Verified in [name]).  Patient does not have any on hand. Can a refill be sent?   Call back phone [phone].

## 2020-10-30 NOTE — Telephone Encounter (Signed)
Patient waslate to appt she has been rescheduled how ever she did mention she will need medication refill . She was scheduled with Dr.Raulkar then sent to you so do I send this to dr.raulkar she is scheduled next Thursday with you .

## 2020-10-31 NOTE — Telephone Encounter (Signed)
Return [name] call, no answer.  [name] last visit in our office was in June /2022. Her last refill was on 08/27/2020, according to the [name].  She has been to the ED x4 with two hospitalizations. She will need to be scheduled for an appointment. This message will be forward to Dr Ranell Patrick, so she can be aware.  [name] came late to her appointment on yesterday and was not seen.

## 2020-11-03 ENCOUNTER — Telehealth: Payer: Self-pay

## 2020-11-03 NOTE — Telephone Encounter (Signed)
Spoke with [name], her last office visit was in June 2022. According to the [name] last prescription of Hydromorphone was filled in June,2022.  [name], states she needs her pain medication, the above was discussed with Dr Deborah [name] last week, she agreed [name] needs to be seen.  I explained the above to [name], she states" the reason she keeps going to the ED, is because she is out of her pain medication". Hospital discharge notes have been reviewed. She would like for the above message to be forward to Dr Deborah [name], so Dr Deborah [name] could prescribe her medication. The above message will be routed to Dr Deborah [name] per [name] request, she verbalizes understanding.

## 2020-11-03 NOTE — Telephone Encounter (Signed)
Patient is returning your call.  

## 2020-11-04 ENCOUNTER — Encounter: Payer: Self-pay | Admitting: Physical Medicine and Rehabilitation

## 2020-11-04 ENCOUNTER — Encounter (HOSPITAL_BASED_OUTPATIENT_CLINIC_OR_DEPARTMENT_OTHER): Payer: Medicare Other | Admitting: Physical Medicine and Rehabilitation

## 2020-11-04 ENCOUNTER — Other Ambulatory Visit: Payer: Self-pay

## 2020-11-04 VITALS — BP 88/58 | HR 72 | Temp 98.2°F | Ht 66.0 in | Wt 299.0 lb

## 2020-11-04 DIAGNOSIS — G893 Neoplasm related pain (acute) (chronic): Secondary | ICD-10-CM

## 2020-11-04 DIAGNOSIS — R112 Nausea with vomiting, unspecified: Secondary | ICD-10-CM | POA: Insufficient documentation

## 2020-11-04 DIAGNOSIS — Z5181 Encounter for therapeutic drug level monitoring: Secondary | ICD-10-CM | POA: Insufficient documentation

## 2020-11-04 DIAGNOSIS — Z79891 Long term (current) use of opiate analgesic: Secondary | ICD-10-CM | POA: Insufficient documentation

## 2020-11-04 DIAGNOSIS — E1143 Type 2 diabetes mellitus with diabetic autonomic (poly)neuropathy: Secondary | ICD-10-CM | POA: Diagnosis not present

## 2020-11-04 DIAGNOSIS — G894 Chronic pain syndrome: Secondary | ICD-10-CM | POA: Insufficient documentation

## 2020-11-04 DIAGNOSIS — C7A010 Malignant carcinoid tumor of the duodenum: Secondary | ICD-10-CM

## 2020-11-04 MED ORDER — HYDROMORPHONE HCL 4 MG PO TABS: 4.0000 mg | ORAL_TABLET | Freq: Two times a day (BID) | ORAL | 0 refills | Status: DC | PRN

## 2020-11-04 NOTE — Progress Notes (Addendum)
Subjective:    Patient ID: Deborah [name], female    DOB: 10-Apr-1965, 55 y.o.   MRN: [id number]  HPI: Deborah [name] is a 55 y.o. female who returns for follow up appointment for chronic pain and medication refill. She reports generalized  pain all over. She rates her pain 10. Her current exercise regime is walking to the bathroom in her home otherwise she is in wheelchair.   UDS ordered today since last time in clinic was June.   She has been taking 2mg  of Dilaudid po 2-3 times per day and this does not help her enough. She has been going to the ED to get IV Dilaudid treatments. She is in cancer remission and does not have any active treatments.   Nausea has been very severe. Compazine does not help. Zofran has helped and needs a refill today  Hypotensive today.   Has not been able to do anything but lay in the bed since she is in so much pain.    Pain Inventory Average Pain 10 Pain Right Now 10 My pain is constant, sharp, and stabbing  In the last 24 hours, has pain interfered with the following? General activity 10 Relation with others 10 Enjoyment of life 10 What TIME of day is your pain at its worst? morning , daytime, evening, and night Sleep (in general) Poor  Pain is worse with: walking, bending, sitting, inactivity, standing, and some activites Pain improves with: medication Relief from Meds: 3  Family History  Problem Relation Age of Onset   Diabetes Mother    Diabetes Father    Heart disease Father    Diabetes Sister    Congestive Heart Failure Sister 95   Diabetes Brother    Social History   Socioeconomic History   Marital status: Single    Spouse name: Not on file   Number of children: Not on file   Years of education: Not on file   Highest education level: Not on file  Occupational History   Not on file  Tobacco Use   Smoking status: Never   Smokeless tobacco: Never  Vaping Use   Vaping Use: Never used  Substance and Sexual Activity    Alcohol use: No   Drug use: No   Sexual activity: Not Currently    Birth control/protection: None  Other Topics Concern   Not on file  Social History Narrative   ** Merged History Encounter **       Social Determinants of Health   Financial Resource Strain: Not on file  Food Insecurity: Not on file  Transportation Needs: Not on file  Physical Activity: Not on file  Stress: Not on file  Social Connections: Not on file   Past Surgical History:  Procedure Laterality Date   BIOPSY  07/27/2019   Procedure: BIOPSY;  Surgeon: Clarene Essex, MD;  Location: [facility];  Service: Endoscopy;;   BIOPSY  07/30/2019   Procedure: BIOPSY;  Surgeon: Otis Brace, MD;  [facility]: [facility];  Service: Gastroenterology;;   CATARACT EXTRACTION  01/2014   CHOLECYSTECTOMY     COLONOSCOPY WITH PROPOFOL N/A 07/30/2019   Procedure: COLONOSCOPY WITH PROPOFOL;  Surgeon: Otis Brace, MD;  [facility];  Service: Gastroenterology;  Laterality: N/A;   ESOPHAGOGASTRODUODENOSCOPY N/A 07/27/2019   Procedure: ESOPHAGOGASTRODUODENOSCOPY (EGD);  Surgeon: Clarene Essex, MD;  [facility]: [facility];  Service: Endoscopy;  Laterality: N/A;   ESOPHAGOGASTRODUODENOSCOPY N/A 07/26/2020   Procedure: ESOPHAGOGASTRODUODENOSCOPY (EGD);  Surgeon: Arta Silence, MD;  [facility];  Service: Endoscopy;  Laterality: N/A;   ESOPHAGOGASTRODUODENOSCOPY (EGD) WITH PROPOFOL N/A 08/02/2019   Procedure: ESOPHAGOGASTRODUODENOSCOPY (EGD) WITH PROPOFOL;  Surgeon: Otis Brace, MD;  [facility];  Service: Gastroenterology;  Laterality: N/A;   HEMOSTASIS CLIP PLACEMENT  08/02/2019   Procedure: HEMOSTASIS CLIP PLACEMENT;  Surgeon: Otis Brace, MD;  [facility];  Service: Gastroenterology;;   POLYPECTOMY  07/30/2019   Procedure: POLYPECTOMY;  Surgeon: Otis Brace, MD;  [facility];  Service: Gastroenterology;;   POLYPECTOMY  08/02/2019   Procedure: POLYPECTOMY;   Surgeon: Otis Brace, MD;  [facility];  Service: Gastroenterology;;   Past Surgical History:  Procedure Laterality Date   BIOPSY  07/27/2019   Procedure: BIOPSY;  Surgeon: Clarene Essex, MD;  [facility];  Service: Endoscopy;;   BIOPSY  07/30/2019   Procedure: BIOPSY;  Surgeon: Otis Brace, MD;  [facility];  Service: Gastroenterology;;   CATARACT EXTRACTION  01/2014   CHOLECYSTECTOMY     COLONOSCOPY WITH PROPOFOL N/A 07/30/2019   Procedure: COLONOSCOPY WITH PROPOFOL;  Surgeon: Otis Brace, MD;  [facility];  Service: Gastroenterology;  Laterality: N/A;   ESOPHAGOGASTRODUODENOSCOPY N/A 07/27/2019   Procedure: ESOPHAGOGASTRODUODENOSCOPY (EGD);  Surgeon: Clarene Essex, MD;  Location: [facility];  Service: Endoscopy;  Laterality: N/A;   ESOPHAGOGASTRODUODENOSCOPY N/A 07/26/2020   Procedure: ESOPHAGOGASTRODUODENOSCOPY (EGD);  Surgeon: Arta Silence, MD;  Location: [facility];  Service: Endoscopy;  Laterality: N/A;   ESOPHAGOGASTRODUODENOSCOPY (EGD) WITH PROPOFOL N/A 08/02/2019   Procedure: ESOPHAGOGASTRODUODENOSCOPY (EGD) WITH PROPOFOL;  Surgeon: Otis Brace, MD;  [facility];  Service: Gastroenterology;  Laterality: N/A;   HEMOSTASIS CLIP PLACEMENT  08/02/2019   Procedure: HEMOSTASIS CLIP PLACEMENT;  Surgeon: Otis Brace, MD;  [facility];  Service: Gastroenterology;;   POLYPECTOMY  07/30/2019   Procedure: POLYPECTOMY;  Surgeon: Otis Brace, MD;  [facility];  Service: Gastroenterology;;   POLYPECTOMY  08/02/2019   Procedure: POLYPECTOMY;  Surgeon: Otis Brace, MD;  [facility];  Service: Gastroenterology;;   Past Medical History:  Diagnosis Date   Acute back pain with sciatica, left    Acute back pain with sciatica, right    AKI (acute kidney injury) ([place])    Anemia, unspecified    Cancer ([place])    Carcinoid tumor of duodenum    Chronic pain    Chronic systolic  CHF (congestive heart failure) ([place])    Diabetes mellitus    Esophageal reflux    Fibromyalgia    Gastric ulcer    Gastroparesis    Gout    Hyperlipidemia    Hypertension    Lumbosacral stenosis    NICM (nonischemic cardiomyopathy) ([place])    Obesity    PAF (paroxysmal atrial fibrillation) ([place])    Stroke ([place]) 02/2011   Vitamin B12 deficiency anemia    LMP 10/10/2012   Opioid Risk Score:   Fall Risk Score:  `1  Depression screen PHQ 2/9  Depression screen [facility] 2/9 08/01/2020 06/05/2020 01/04/2020 11/15/2019 11/14/2019 10/10/2019 09/20/2019  Decreased Interest 1 1 2 3 1 3 3   Down, Depressed, Hopeless 1 1 3 3 1 3 3   PHQ - 2 Score 2 2 5 6 2 6 6   Altered sleeping - - 3 3 - 3 3  Tired, decreased energy - - 3 3 - 3 3  Change in appetite - - 3 3 - 3 3  Feeling bad or failure about yourself  - - 3 3 - 2 2  Trouble concentrating - - 3  3 - 3 3  Moving slowly or fidgety/restless - - 3 3 - 3 1  Suicidal thoughts - - 0 3 - 0 1  PHQ-9 Score - - 23 27 - 23 -  Difficult doing work/chores - - - - - - Very difficult  Some recent data might be hidden    Review of Systems  Constitutional: Negative.   HENT: Negative.    Eyes: Negative.   Respiratory: Negative.    Gastrointestinal:  Positive for abdominal pain.  Endocrine: Negative.   Genitourinary: Negative.   Musculoskeletal:  Positive for back pain.       Bilateral hip pain Bilateral leg pain spasms  Allergic/Immunologic: Negative.   Neurological:  Positive for dizziness, tremors, weakness and numbness.       Tingling  Psychiatric/Behavioral: Negative.    All other systems reviewed and are negative.     Objective:   Physical Exam Vitals and nursing note reviewed.  Constitutional:      Appearance: She is ill-appearing. BMI 48.26. Husband accompanies her. BP 88/58 Cardiovascular:     Rate and Rhythm: Normal rate and regular rhythm.     Pulses: Normal pulses.     Heart sounds: Normal heart sounds.  Pulmonary:     Effort: Pulmonary  effort is normal.     Breath sounds: Normal breath sounds.  Musculoskeletal:     Cervical back: Normal range of motion and neck supple.     Comments: Normal Muscle Bulk and Muscle Testing Reveals:  Upper Extremities: Decreased ROM and Muscle Strength  4/5 Lower Extremities: Decreased ROM and Muscle Strength 4/4 Wheel-chair bound    Skin:    General: Skin is warm and dry.  Neurological:     Mental Status: She is alert and oriented to person, place, and time.  Psychiatric:        Mood and Affect: Mood normal.        Behavior: Behavior normal.        Assessment & Plan:  1. 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 08/27/2020 2. Chronic Pain Syndrome: Refilled Dilaudid 4 mg one tablet BID PRN. Discussed with Deborah [name] that she can increase frequency to TID in 1 month if she needs.  Getting a UDS or swab today.  We will continue the opioid monitoring program, this consists of regular clinic visits, examinations, urine drug screen, pill counts as well as use of [place] Controlled Substance Reporting system. A 12 month History has been reviewed on the [place] Controlled Substance Reporting System.  -Discussed current symptoms of pain and history of pain.  -Discussed benefits of exercise in reducing pain. -Discussed following foods that may reduce pain: 1) Ginger (especially studied for arthritis)- reduce leukotriene production to decrease inflammation 2) Blueberries- high in phytonutrients that decrease inflammation 3) Salmon- marine omega-3s reduce joint swelling and pain 4) Pumpkin seeds- reduce inflammation 5) dark chocolate- reduces inflammation 6) turmeric- reduces inflammation 7) tart cherries - reduce pain and stiffness 8) extra virgin olive oil - its compound olecanthal helps to block prostaglandins  9) chili peppers- can be eaten or applied topically via capsaicin 10) mint- helpful for headache, muscle aches, joint  pain, and itching 11) garlic- reduces inflammation  Link to further information on diet for chronic pain: http://www.randall.[email or web address]/  3. Nausea: Ordered Zofran PRN   F/U with Deborah [name] monthly next 3 months, and me in 4 months

## 2020-11-06 ENCOUNTER — Observation Stay (HOSPITAL_COMMUNITY): Payer: Medicare Other

## 2020-11-06 ENCOUNTER — Ambulatory Visit: Payer: Medicare Other | Admitting: Registered Nurse

## 2020-11-06 ENCOUNTER — Inpatient Hospital Stay (HOSPITAL_COMMUNITY)
Admission: EM | Admit: 2020-11-06 | Discharge: 2020-11-08 | DRG: 074 | Disposition: A | Discharge status: Home / Self Care | Payer: Medicare Other | Attending: Family Medicine | Admitting: Family Medicine

## 2020-11-06 DIAGNOSIS — K219 Gastro-esophageal reflux disease without esophagitis: Secondary | ICD-10-CM | POA: Diagnosis present

## 2020-11-06 DIAGNOSIS — E872 Acidosis: Secondary | ICD-10-CM | POA: Diagnosis present

## 2020-11-06 DIAGNOSIS — Z886 Allergy status to analgesic agent status: Secondary | ICD-10-CM

## 2020-11-06 DIAGNOSIS — Z8249 Family history of ischemic heart disease and other diseases of the circulatory system: Secondary | ICD-10-CM

## 2020-11-06 DIAGNOSIS — Z9049 Acquired absence of other specified parts of digestive tract: Secondary | ICD-10-CM

## 2020-11-06 DIAGNOSIS — R112 Nausea with vomiting, unspecified: Secondary | ICD-10-CM | POA: Diagnosis present

## 2020-11-06 DIAGNOSIS — I428 Other cardiomyopathies: Secondary | ICD-10-CM | POA: Diagnosis present

## 2020-11-06 DIAGNOSIS — I480 Paroxysmal atrial fibrillation: Secondary | ICD-10-CM | POA: Diagnosis present

## 2020-11-06 DIAGNOSIS — E1140 Type 2 diabetes mellitus with diabetic neuropathy, unspecified: Secondary | ICD-10-CM | POA: Diagnosis present

## 2020-11-06 DIAGNOSIS — E785 Hyperlipidemia, unspecified: Secondary | ICD-10-CM | POA: Diagnosis present

## 2020-11-06 DIAGNOSIS — Z6841 Body Mass Index (BMI) 40.0 and over, adult: Secondary | ICD-10-CM

## 2020-11-06 DIAGNOSIS — K3184 Gastroparesis: Secondary | ICD-10-CM | POA: Diagnosis present

## 2020-11-06 DIAGNOSIS — Z8673 Personal history of transient ischemic attack (TIA), and cerebral infarction without residual deficits: Secondary | ICD-10-CM

## 2020-11-06 DIAGNOSIS — Z885 Allergy status to narcotic agent status: Secondary | ICD-10-CM

## 2020-11-06 DIAGNOSIS — I5022 Chronic systolic (congestive) heart failure: Secondary | ICD-10-CM | POA: Diagnosis present

## 2020-11-06 DIAGNOSIS — M797 Fibromyalgia: Secondary | ICD-10-CM | POA: Diagnosis present

## 2020-11-06 DIAGNOSIS — I10 Essential (primary) hypertension: Secondary | ICD-10-CM | POA: Diagnosis present

## 2020-11-06 DIAGNOSIS — Z8711 Personal history of peptic ulcer disease: Secondary | ICD-10-CM

## 2020-11-06 DIAGNOSIS — Z880 Allergy status to penicillin: Secondary | ICD-10-CM

## 2020-11-06 DIAGNOSIS — N184 Chronic kidney disease, stage 4 (severe): Secondary | ICD-10-CM | POA: Diagnosis present

## 2020-11-06 DIAGNOSIS — Z91041 Radiographic dye allergy status: Secondary | ICD-10-CM

## 2020-11-06 DIAGNOSIS — Z7901 Long term (current) use of anticoagulants: Secondary | ICD-10-CM

## 2020-11-06 DIAGNOSIS — N1832 Chronic kidney disease, stage 3b: Secondary | ICD-10-CM | POA: Diagnosis present

## 2020-11-06 DIAGNOSIS — Z79899 Other long term (current) drug therapy: Secondary | ICD-10-CM

## 2020-11-06 DIAGNOSIS — Z888 Allergy status to other drugs, medicaments and biological substances status: Secondary | ICD-10-CM

## 2020-11-06 DIAGNOSIS — Z20822 Contact with and (suspected) exposure to covid-19: Secondary | ICD-10-CM | POA: Diagnosis present

## 2020-11-06 DIAGNOSIS — E1165 Type 2 diabetes mellitus with hyperglycemia: Secondary | ICD-10-CM | POA: Diagnosis present

## 2020-11-06 DIAGNOSIS — E1122 Type 2 diabetes mellitus with diabetic chronic kidney disease: Secondary | ICD-10-CM | POA: Diagnosis present

## 2020-11-06 DIAGNOSIS — I130 Hypertensive heart and chronic kidney disease with heart failure and stage 1 through stage 4 chronic kidney disease, or unspecified chronic kidney disease: Secondary | ICD-10-CM | POA: Diagnosis present

## 2020-11-06 DIAGNOSIS — E1143 Type 2 diabetes mellitus with diabetic autonomic (poly)neuropathy: Principal | ICD-10-CM | POA: Diagnosis present

## 2020-11-06 DIAGNOSIS — Z85068 Personal history of other malignant neoplasm of small intestine: Secondary | ICD-10-CM

## 2020-11-06 DIAGNOSIS — G894 Chronic pain syndrome: Secondary | ICD-10-CM | POA: Diagnosis present

## 2020-11-06 DIAGNOSIS — Z794 Long term (current) use of insulin: Secondary | ICD-10-CM

## 2020-11-06 DIAGNOSIS — Z833 Family history of diabetes mellitus: Secondary | ICD-10-CM

## 2020-11-06 DIAGNOSIS — IMO0002 Reserved for concepts with insufficient information to code with codable children: Secondary | ICD-10-CM

## 2020-11-06 LAB — I-STAT VENOUS BLOOD GAS, ED
Acid-base deficit: 5 mmol/L — ABNORMAL HIGH (ref 0.0–2.0)
Bicarbonate: 17 mmol/L — ABNORMAL LOW (ref 20.0–28.0)
Calcium, Ion: 1.24 mmol/L (ref 1.15–1.40)
HCT: 32 % — ABNORMAL LOW (ref 36.0–46.0)
Hemoglobin: 10.9 g/dL — ABNORMAL LOW (ref 12.0–15.0)
O2 Saturation: 84 %
Potassium: 3.2 mmol/L — ABNORMAL LOW (ref 3.5–5.1)
Sodium: 143 mmol/L (ref 135–145)
TCO2: 18 mmol/L — ABNORMAL LOW (ref 22–32)
pCO2, Ven: 23.5 mmHg — ABNORMAL LOW (ref 44.0–60.0)
pH, Ven: 7.469 — ABNORMAL HIGH (ref 7.250–7.430)
pO2, Ven: 44 mmHg (ref 32.0–45.0)

## 2020-11-06 LAB — COMPREHENSIVE METABOLIC PANEL
ALT: 18 U/L (ref 0–44)
AST: 20 U/L (ref 15–41)
Albumin: 3.2 g/dL — ABNORMAL LOW (ref 3.5–5.0)
Alkaline Phosphatase: 104 U/L (ref 38–126)
Anion gap: 11 (ref 5–15)
BUN: 22 mg/dL — ABNORMAL HIGH (ref 6–20)
CO2: 17 mmol/L — ABNORMAL LOW (ref 22–32)
Calcium: 9.7 mg/dL (ref 8.9–10.3)
Chloride: 112 mmol/L — ABNORMAL HIGH (ref 98–111)
Creatinine, Ser: 1.31 mg/dL — ABNORMAL HIGH (ref 0.44–1.00)
GFR, Estimated: 48 mL/min — ABNORMAL LOW (ref >60)
Glucose, Bld: 101 mg/dL — ABNORMAL HIGH (ref 70–99)
Potassium: 3.2 mmol/L — ABNORMAL LOW (ref 3.5–5.1)
Sodium: 140 mmol/L (ref 135–145)
Total Bilirubin: 0.7 mg/dL (ref 0.3–1.2)
Total Protein: 7.9 g/dL (ref 6.5–8.1)

## 2020-11-06 LAB — CBC WITH DIFFERENTIAL/PLATELET
Abs Immature Granulocytes: 0.05 10*3/uL (ref 0.00–0.07)
Basophils Absolute: 0.1 10*3/uL (ref 0.0–0.1)
Basophils Relative: 1 %
Eosinophils Absolute: 0.4 10*3/uL (ref 0.0–0.5)
Eosinophils Relative: 3 %
HCT: 33 % — ABNORMAL LOW (ref 36.0–46.0)
Hemoglobin: 10.5 g/dL — ABNORMAL LOW (ref 12.0–15.0)
Immature Granulocytes: 0 %
Lymphocytes Relative: 22 %
Lymphs Abs: 3 10*3/uL (ref 0.7–4.0)
MCH: 28.2 pg (ref 26.0–34.0)
MCHC: 31.8 g/dL (ref 30.0–36.0)
MCV: 88.7 fL (ref 80.0–100.0)
Monocytes Absolute: 0.7 10*3/uL (ref 0.1–1.0)
Monocytes Relative: 5 %
Neutro Abs: 9.2 10*3/uL — ABNORMAL HIGH (ref 1.7–7.7)
Neutrophils Relative %: 69 %
Platelets: 344 10*3/uL (ref 150–400)
RBC: 3.72 MIL/uL — ABNORMAL LOW (ref 3.87–5.11)
RDW: 16.1 % — ABNORMAL HIGH (ref 11.5–15.5)
WBC: 13.3 10*3/uL — ABNORMAL HIGH (ref 4.0–10.5)
nRBC: 0 % (ref 0.0–0.2)

## 2020-11-06 LAB — LIPASE, BLOOD: Lipase: 40 U/L (ref 11–51)

## 2020-11-06 LAB — I-STAT BETA HCG BLOOD, ED (MC, WL, AP ONLY): I-stat hCG, quantitative: <5 m[IU]/mL (ref <5)

## 2020-11-06 MED ORDER — HYDROMORPHONE HCL 1 MG/ML IJ SOLN
1.0000 mg | Freq: Once | INTRAMUSCULAR | Status: AC
  Administered 2020-11-06: 1 mg via INTRAVENOUS
  Filled 2020-11-06: qty 1

## 2020-11-06 MED ORDER — ONDANSETRON HCL 4 MG/2ML IJ SOLN
4.0000 mg | Freq: Once | INTRAMUSCULAR | Status: AC
  Administered 2020-11-06: 4 mg via INTRAVENOUS

## 2020-11-06 MED ORDER — FAMOTIDINE 20 MG IN NS 100 ML IVPB
20.0000 mg | Freq: Once | INTRAVENOUS | Status: AC
  Administered 2020-11-06: 20 mg via INTRAVENOUS
  Filled 2020-11-06: qty 100

## 2020-11-06 MED ORDER — SODIUM CHLORIDE 0.9 % IV BOLUS
1000.0000 mL | Freq: Once | INTRAVENOUS | Status: AC
  Administered 2020-11-06: 1000 mL via INTRAVENOUS

## 2020-11-06 NOTE — ED Provider Notes (Signed)
[facility] EMERGENCY DEPARTMENT Provider Note   [name]: [id number] Arrival date & time: 11/06/20  1559     History Chief Complaint  Patient presents with   Chest Pain    Deborah [name] is a 55 y.o. female.  HPI  55 year old female with past medical history of diabetic gastroparesis, HTN, HLD, nonischemic cardiomyopathy/CHF, AKI, paroxysmal atrial fibrillation on anticoagulation, carcinoid of the duodenum status postresection emergency department with acute on chronic abdominal pain and nausea/vomiting.  Patient states her symptoms started mildly last night and became severe today.  She endorses multiple episodes of emesis has been nonbloody.  Complaining of acute on chronic abdominal pain.  At this time denies any chest pain or shortness of breath.  States that she is still having bowel movements, denies diarrhea.  No genitourinary complaints.  No recent fever.  Past Medical History:  Diagnosis Date   Acute back pain with sciatica, left    Acute back pain with sciatica, right    AKI (acute kidney injury) ([place])    Anemia, unspecified    Cancer ([place])    Carcinoid tumor of duodenum    Chronic pain    Chronic systolic CHF (congestive heart failure) ([facility])    Diabetes mellitus    Esophageal reflux    Fibromyalgia    Gastric ulcer    Gastroparesis    Gout    Hyperlipidemia    Hypertension    Lumbosacral stenosis    NICM (nonischemic cardiomyopathy) ([facility])    Obesity    PAF (paroxysmal atrial fibrillation) ([place])    Stroke ([place]) 02/2011   Vitamin B12 deficiency anemia     Patient Active Problem List   Diagnosis Date Noted   PAF (paroxysmal atrial fibrillation) ([facility])    Prolonged QT interval    Carcinoid tumor of duodenum    Non-alcoholic cirrhosis ([place])    Thrombocytosis    Acute renal failure superimposed on stage 3b chronic kidney disease ([place])    ARF (acute renal failure) ([place]) 07/21/2020   Vaginal yeast infection 01/04/2020   AKI (acute kidney  injury) ([place]) 11/27/2019   Chronic pain 09/04/2019   Malignant carcinoid tumor of duodenum ([place])    Nonalcoholic fatty liver disease 06/05/2019   Chronic diarrhea    COPD exacerbation ([place]) 05/08/2019   Acute on chronic HFrEF (heart failure with reduced ejection fraction) ([place])    Restrictive lung disease secondary to obesity    History of gastric ulcer    Uncontrolled type 2 diabetes mellitus with hyperglycemia ([place])    Fibroid uterus 18/29/9371   Chronic systolic CHF (congestive heart failure) ([place]) 12/19/2018   Intractable nausea and vomiting 12/17/2018   Hypoxia 12/17/2018   Abdominal pain 07/17/2018   Cardiac arrest ([place]) 11/29/2017   Leukocytosis 11/29/2017   Anxiety 11/29/2017   Stroke (cerebrum) ([place]) 03/19/2017   GERD (gastroesophageal reflux disease) 03/19/2017   Depression 03/19/2017   Morbid obesity ([place])    Normocytic anemia 08/16/2016   Diabetic gastroparesis ([place]) 08/16/2016   Gout 06/05/2016   Chest pain 09/26/2015   Hypokalemia 09/26/2015   Hypomagnesemia 09/26/2015   Uncontrolled type 2 diabetes mellitus with diabetic neuropathy, with long-term current use of insulin ([place]) 05/25/2015   Type 2 diabetes mellitus with hyperglycemia, with long-term current use of insulin ([place]) 05/25/2015   Chronic kidney disease, stage 3b ([place]) 05/25/2015   Essential hypertension, benign 09/28/2013    Past Surgical History:  Procedure Laterality Date   BIOPSY  07/27/2019   Procedure: BIOPSY;  Surgeon:  Clarene Essex, MD;  Location: [facility];  Service: Endoscopy;;   BIOPSY  07/30/2019   Procedure: BIOPSY;  Surgeon: Otis Brace, MD;  [facility];  Service: Gastroenterology;;   CATARACT EXTRACTION  01/2014   CHOLECYSTECTOMY     COLONOSCOPY WITH PROPOFOL N/A 07/30/2019   Procedure: COLONOSCOPY WITH PROPOFOL;  Surgeon: Otis Brace, MD;  [facility];  Service: Gastroenterology;  Laterality: N/A;   ESOPHAGOGASTRODUODENOSCOPY N/A 07/27/2019    Procedure: ESOPHAGOGASTRODUODENOSCOPY (EGD);  Surgeon: Clarene Essex, MD;  [facility];  Service: Endoscopy;  Laterality: N/A;   ESOPHAGOGASTRODUODENOSCOPY N/A 07/26/2020   Procedure: ESOPHAGOGASTRODUODENOSCOPY (EGD);  Surgeon: Arta Silence, MD;  Location: [facility];  Service: Endoscopy;  Laterality: N/A;   ESOPHAGOGASTRODUODENOSCOPY (EGD) WITH PROPOFOL N/A 08/02/2019   Procedure: ESOPHAGOGASTRODUODENOSCOPY (EGD) WITH PROPOFOL;  Surgeon: Otis Brace, MD;  [facility];  Service: Gastroenterology;  Laterality: N/A;   HEMOSTASIS CLIP PLACEMENT  08/02/2019   Procedure: HEMOSTASIS CLIP PLACEMENT;  Surgeon: Otis Brace, MD;  [facility];  Service: Gastroenterology;;   POLYPECTOMY  07/30/2019   Procedure: POLYPECTOMY;  Surgeon: Otis Brace, MD;  [facility];  Service: Gastroenterology;;   POLYPECTOMY  08/02/2019   Procedure: POLYPECTOMY;  Surgeon: Otis Brace, MD;  [facility];  Service: Gastroenterology;;     OB History     Gravida  0   Para  0   Term  0   Preterm  0   AB  0   Living  0      SAB  0   IAB  0   Ectopic  0   Multiple  0   Live Births  0           Family History  Problem Relation Age of Onset   Diabetes Mother    Diabetes Father    Heart disease Father    Diabetes Sister    Congestive Heart Failure Sister 33   Diabetes Brother     Social History   Tobacco Use   Smoking status: Never   Smokeless tobacco: Never  Vaping Use   Vaping Use: Never used  Substance Use Topics   Alcohol use: No   Drug use: No    Home Medications Prior to Admission medications   Medication Sig Start Date End Date Taking? Authorizing Provider  albuterol (PROVENTIL) (2.5 MG/3ML) 0.083% nebulizer solution Take 3 mLs (2.5 mg total) by nebulization every 6 (six) hours as needed for wheezing or shortness of breath. 04/06/19   Scot Jun, FNP  atorvastatin (LIPITOR) 10 MG tablet TAKE ONE  TABLET BY MOUTH ONCE DAILY (AM) 10/22/20   Alcus Dad, MD  blood glucose meter kit and supplies KIT Dispense based on patient and insurance preference. Use up to four times daily as directed. (FOR ICD-9 250.00, 250.01). 12/13/18   [name], DO  cetirizine (ZYRTEC) 10 MG tablet Take 10 mg by mouth daily. 10/21/20   [provider]  DULoxetine HCl 40 MG CPEP TAKE ONE CAPSULE BY MOUTH TWICE DAILY 10/22/20   Alcus Dad, MD  EASY COMFORT PEN NEEDLES 31G X 5 MM MISC USE 3 TIMES A DAY FOR INSULIN ADMINISTRATION 11/14/19   Meccariello, Mel Almond J, DO  ELIQUIS 5 MG TABS tablet TAKE 1 TABLET BY MOUTH 2 (TWO) TIMES DAILY. 12/20/19   Meccariello, Bernita Raisin, DO  fluticasone (FLONASE) 50 MCG/ACT nasal spray Place 2 sprays into both nostrils daily as needed for allergies or rhinitis. 12/19/18   [name], MD  hydrALAZINE (  APRESOLINE) 50 MG tablet Take 1 tablet (50 mg total) by mouth 3 (three) times daily. 09/10/20   Alcus Dad, MD  HYDROmorphone (DILAUDID) 4 MG tablet Take 1 tablet (4 mg total) by mouth 2 (two) times daily as needed for severe pain. 11/04/20   Raulkar, Clide Deutscher, MD  isosorbide mononitrate (IMDUR) 60 MG 24 hr tablet TAKE 1 TABLET (60 MG TOTAL) BY MOUTH DAILY. 09/10/20   Alcus Dad, MD  LANTUS SOLOSTAR 100 UNIT/ML Solostar Pen Inject 40 Units into the skin daily. Patient taking differently: Inject 50 Units into the skin at bedtime. 10/13/20 11/14/20  Dwyane Dee, MD  melatonin 3 MG TABS tablet TAKE 2 TABLETS (6 MG TOTAL) BY MOUTH AT BEDTIME. 08/11/20   Meccariello, Bernita Raisin, DO  metoCLOPramide (REGLAN) 5 MG tablet Take 5 mg by mouth 4 (four) times daily. 10/21/20   [provider]  metoprolol succinate (TOPROL-XL) 25 MG 24 hr tablet Take 25 mg by mouth daily. 09/05/20   [provider]  nitroGLYCERIN (NITROSTAT) 0.4 MG SL tablet Place 1 tablet (0.4 mg total) under the tongue every 5 (five) minutes as needed for chest pain. 12/13/18   Lockamy, Timothy, DO   NOVOLOG FLEXPEN 100 UNIT/ML FlexPen Inject 1-15 Units into the skin in the morning, at noon, in the evening, and at bedtime. CBG 121 - 150: 2 units, CBG 151 - 200: 3 units, CBG 201 - 250: 5 units, CBG 251 - 300: 8 units, CBG 301 - 350: 11 units, CBG 351 - 400: 15 units, CBG > 400 call MD 10/13/20   Dwyane Dee, MD  ondansetron (ZOFRAN ODT) 4 MG disintegrating tablet Take 1 tablet (4 mg total) by mouth every 8 (eight) hours as needed for nausea or vomiting. 09/14/20   Fondaw, Kathleene Hazel, PA  pantoprazole (PROTONIX) 40 MG tablet TAKE 1 TABLET (40 MG TOTAL) BY MOUTH 2 (TWO) TIMES DAILY. 10/22/20   Alcus Dad, MD  promethazine (PHENERGAN) 12.5 MG tablet Take 1 tablet (12.5 mg total) by mouth every 6 (six) hours as needed for nausea or vomiting. 07/27/20   Shelly Coss, MD  spironolactone (ALDACTONE) 25 MG tablet Take 12.5 mg by mouth daily.    [provider]  torsemide (DEMADEX) 20 MG tablet Take 2 tablets (40 mg total) by mouth daily. 07/27/20   Shelly Coss, MD    Allergies    Diazepam, Gabapentin, Iodinated diagnostic agents, Isovue [iopamidol], Lisinopril, Metoclopramide, Nsaids, Penicillins, Tolmetin, Aspirin, Cyclobenzaprine, Rifamycins, Acetaminophen, Oxycodone, and Tramadol  Review of Systems   Review of Systems  Constitutional:  Positive for fatigue. Negative for chills and fever.  HENT:  Negative for congestion.   Eyes:  Negative for visual disturbance.  Respiratory:  Negative for shortness of breath.   Cardiovascular:  Negative for chest pain.  Gastrointestinal:  Positive for abdominal pain, nausea and vomiting. Negative for abdominal distention, blood in stool and diarrhea.  Genitourinary:  Negative for dysuria and flank pain.  Musculoskeletal:  Negative for back pain.  Skin:  Negative for rash.  Neurological:  Negative for headaches.   Physical Exam Updated Vital Signs BP (!) 162/119   Pulse (!) 124   Resp (!) 21   LMP 10/10/2012   SpO2 100%   Physical  Exam Vitals and nursing note reviewed.  Constitutional:      Appearance: Normal appearance. She is obese.     Comments: Crying and hanging over the side of the bed  HENT:     Head: Normocephalic.  Mouth/Throat:     Mouth: Mucous membranes are moist.  Cardiovascular:     Rate and Rhythm: Tachycardia present.  Pulmonary:     Effort: Pulmonary effort is normal. No tachypnea or respiratory distress.  Abdominal:     Palpations: Abdomen is soft.     Tenderness: There is abdominal tenderness. There is no guarding or rebound.     Comments: obese  Skin:    General: Skin is warm.  Neurological:     Mental Status: She is alert and oriented to person, place, and time. Mental status is at baseline.  Psychiatric:        Mood and Affect: Mood is anxious.    ED Results / Procedures / Treatments   Labs (all labs ordered are listed, but only abnormal results are displayed) Labs Reviewed  I-STAT VENOUS BLOOD GAS, ED - Abnormal; Notable for the following components:      Result Value   pH, Ven 7.469 (*)    pCO2, Ven 23.5 (*)    Bicarbonate 17.0 (*)    TCO2 18 (*)    Acid-base deficit 5.0 (*)    Potassium 3.2 (*)    HCT 32.0 (*)    Hemoglobin 10.9 (*)    All other components within normal limits  CBC WITH DIFFERENTIAL/PLATELET  COMPREHENSIVE METABOLIC PANEL  LIPASE, BLOOD  I-STAT BETA HCG BLOOD, ED ([facility], [facility], [facility] ONLY)    EKG EKG Interpretation  Date/Time:  Thursday November 06 2020 16:06:10 EDT Ventricular Rate:  131 PR Interval:  147 QRS Duration: 86 QT Interval:  315 QTC Calculation: 453 R Axis:   -45 Text Interpretation: Sinus tachycardia Multiple premature complexes, vent & supraven Left anterior fascicular block Sinus tachycardia, normal QTc Confirmed by Lavenia Atlas [phone]) on 11/06/2020 4:34:58 PM  Radiology No results found.  Procedures Procedures   Medications Ordered in ED Medications  sodium chloride 0.9 % bolus 1,000 mL (1,000 mLs Intravenous New Bag/Given  11/06/20 1649)  HYDROmorphone (DILAUDID) injection 1 mg (1 mg Intravenous Given 11/06/20 1645)  ondansetron (ZOFRAN) injection 4 mg (4 mg Intravenous Given 11/06/20 1645)    ED Course  I have reviewed the triage vital signs and the nursing notes.  Pertinent labs & imaging results that were available during my care of the patient were reviewed by me and considered in my medical decision making (see chart for details).    MDM Rules/Calculators/A&P                           55 year old female with diabetic gastroparesis and chronic abdominal pain/nausea/vomiting presents emergency department acute on chronic abdominal pain with nausea and vomiting.  She is tachycardic on arrival, active retching.  Patient is well-known to the emergency department, has frequent presentation for the above complaints.  Most recently she was discharged about 2 weeks ago after an admission for the same complaints.  At that time CT imaging was negative.  She has a mild leukocytosis and metabolic derangements which is around baseline for her.  After multiple rounds of IV medications she has been unable to tolerate p.o. and continues to have active vomiting.  Her abdominal exam remains benign, with multiple recent CT imagings I do not feel emergent CT is warranted at this time, no signs of obstruction, she is dry heaving and not producing any emesis at this time, she is still having bowel movements/passing gas.  Patients evaluation and results requires admission for further treatment and care. Patient  agrees with admission plan, offers no new complaints and is stable/unchanged at time of admit.  Final Clinical Impression(s) / ED Diagnoses Final diagnoses:  None    Rx / DC Orders ED Discharge Orders     None        Lorelle Gibbs, DO 11/06/20 2346

## 2020-11-06 NOTE — ED Triage Notes (Addendum)
Pt bib gems c/o CP and abd pain with n&v. Per pt , she vomited abt 6 times today. A& O x 4    Meds given by ems:  - 1 nitro   Vitals with ems:   -BP 150/100  - 100% RA - HR 110-120

## 2020-11-07 ENCOUNTER — Observation Stay (HOSPITAL_COMMUNITY): Payer: Medicare Other

## 2020-11-07 ENCOUNTER — Other Ambulatory Visit: Payer: Self-pay

## 2020-11-07 ENCOUNTER — Encounter (HOSPITAL_COMMUNITY): Payer: Self-pay | Admitting: Internal Medicine

## 2020-11-07 DIAGNOSIS — M797 Fibromyalgia: Secondary | ICD-10-CM | POA: Diagnosis present

## 2020-11-07 DIAGNOSIS — E1165 Type 2 diabetes mellitus with hyperglycemia: Secondary | ICD-10-CM | POA: Diagnosis present

## 2020-11-07 DIAGNOSIS — R112 Nausea with vomiting, unspecified: Secondary | ICD-10-CM | POA: Diagnosis present

## 2020-11-07 DIAGNOSIS — E872 Acidosis: Secondary | ICD-10-CM | POA: Diagnosis present

## 2020-11-07 DIAGNOSIS — I5022 Chronic systolic (congestive) heart failure: Secondary | ICD-10-CM | POA: Diagnosis present

## 2020-11-07 DIAGNOSIS — Z8673 Personal history of transient ischemic attack (TIA), and cerebral infarction without residual deficits: Secondary | ICD-10-CM | POA: Diagnosis not present

## 2020-11-07 DIAGNOSIS — E785 Hyperlipidemia, unspecified: Secondary | ICD-10-CM | POA: Diagnosis present

## 2020-11-07 DIAGNOSIS — Z8249 Family history of ischemic heart disease and other diseases of the circulatory system: Secondary | ICD-10-CM | POA: Diagnosis not present

## 2020-11-07 DIAGNOSIS — I428 Other cardiomyopathies: Secondary | ICD-10-CM | POA: Diagnosis present

## 2020-11-07 DIAGNOSIS — Z6841 Body Mass Index (BMI) 40.0 and over, adult: Secondary | ICD-10-CM | POA: Diagnosis not present

## 2020-11-07 DIAGNOSIS — I130 Hypertensive heart and chronic kidney disease with heart failure and stage 1 through stage 4 chronic kidney disease, or unspecified chronic kidney disease: Secondary | ICD-10-CM | POA: Diagnosis present

## 2020-11-07 DIAGNOSIS — K219 Gastro-esophageal reflux disease without esophagitis: Secondary | ICD-10-CM | POA: Diagnosis present

## 2020-11-07 DIAGNOSIS — Z9049 Acquired absence of other specified parts of digestive tract: Secondary | ICD-10-CM | POA: Diagnosis not present

## 2020-11-07 DIAGNOSIS — Z8711 Personal history of peptic ulcer disease: Secondary | ICD-10-CM | POA: Diagnosis not present

## 2020-11-07 DIAGNOSIS — E1143 Type 2 diabetes mellitus with diabetic autonomic (poly)neuropathy: Secondary | ICD-10-CM | POA: Diagnosis present

## 2020-11-07 DIAGNOSIS — Z833 Family history of diabetes mellitus: Secondary | ICD-10-CM | POA: Diagnosis not present

## 2020-11-07 DIAGNOSIS — E1122 Type 2 diabetes mellitus with diabetic chronic kidney disease: Secondary | ICD-10-CM | POA: Diagnosis present

## 2020-11-07 DIAGNOSIS — Z20822 Contact with and (suspected) exposure to covid-19: Secondary | ICD-10-CM | POA: Diagnosis present

## 2020-11-07 DIAGNOSIS — N1832 Chronic kidney disease, stage 3b: Secondary | ICD-10-CM | POA: Diagnosis present

## 2020-11-07 DIAGNOSIS — I480 Paroxysmal atrial fibrillation: Secondary | ICD-10-CM | POA: Diagnosis present

## 2020-11-07 DIAGNOSIS — E1140 Type 2 diabetes mellitus with diabetic neuropathy, unspecified: Secondary | ICD-10-CM | POA: Diagnosis present

## 2020-11-07 DIAGNOSIS — I10 Essential (primary) hypertension: Secondary | ICD-10-CM | POA: Diagnosis not present

## 2020-11-07 DIAGNOSIS — G894 Chronic pain syndrome: Secondary | ICD-10-CM | POA: Diagnosis present

## 2020-11-07 DIAGNOSIS — K3184 Gastroparesis: Secondary | ICD-10-CM | POA: Diagnosis present

## 2020-11-07 DIAGNOSIS — Z85068 Personal history of other malignant neoplasm of small intestine: Secondary | ICD-10-CM | POA: Diagnosis not present

## 2020-11-07 LAB — COMPREHENSIVE METABOLIC PANEL
ALT: 13 U/L (ref 0–44)
AST: 14 U/L — ABNORMAL LOW (ref 15–41)
Albumin: 3 g/dL — ABNORMAL LOW (ref 3.5–5.0)
Alkaline Phosphatase: 80 U/L (ref 38–126)
Anion gap: 11 (ref 5–15)
BUN: 18 mg/dL (ref 6–20)
CO2: 16 mmol/L — ABNORMAL LOW (ref 22–32)
Calcium: 8.6 mg/dL — ABNORMAL LOW (ref 8.9–10.3)
Chloride: 109 mmol/L (ref 98–111)
Creatinine, Ser: 1.16 mg/dL — ABNORMAL HIGH (ref 0.44–1.00)
GFR, Estimated: 55 mL/min — ABNORMAL LOW (ref >60)
Glucose, Bld: 164 mg/dL — ABNORMAL HIGH (ref 70–99)
Potassium: 3.3 mmol/L — ABNORMAL LOW (ref 3.5–5.1)
Sodium: 136 mmol/L (ref 135–145)
Total Bilirubin: 1 mg/dL (ref 0.3–1.2)
Total Protein: 6.8 g/dL (ref 6.5–8.1)

## 2020-11-07 LAB — CBG MONITORING, ED
Glucose-Capillary: 133 mg/dL — ABNORMAL HIGH (ref 70–99)
Glucose-Capillary: 145 mg/dL — ABNORMAL HIGH (ref 70–99)
Glucose-Capillary: 157 mg/dL — ABNORMAL HIGH (ref 70–99)

## 2020-11-07 LAB — CBC
HCT: 30.2 % — ABNORMAL LOW (ref 36.0–46.0)
Hemoglobin: 9.5 g/dL — ABNORMAL LOW (ref 12.0–15.0)
MCH: 28.4 pg (ref 26.0–34.0)
MCHC: 31.5 g/dL (ref 30.0–36.0)
MCV: 90.1 fL (ref 80.0–100.0)
Platelets: 307 10*3/uL (ref 150–400)
RBC: 3.35 MIL/uL — ABNORMAL LOW (ref 3.87–5.11)
RDW: 16.8 % — ABNORMAL HIGH (ref 11.5–15.5)
WBC: 12.2 10*3/uL — ABNORMAL HIGH (ref 4.0–10.5)
nRBC: 0 % (ref 0.0–0.2)

## 2020-11-07 LAB — SARS CORONAVIRUS 2 (TAT 6-24 HRS): SARS Coronavirus 2: NEGATIVE

## 2020-11-07 LAB — GLUCOSE, CAPILLARY: Glucose-Capillary: 274 mg/dL — ABNORMAL HIGH (ref 70–99)

## 2020-11-07 MED ORDER — HYDRALAZINE HCL 20 MG/ML IJ SOLN
10.0000 mg | INTRAMUSCULAR | Status: DC | PRN
  Administered 2020-11-07: 10 mg via INTRAVENOUS
  Filled 2020-11-07: qty 1

## 2020-11-07 MED ORDER — INSULIN ASPART 100 UNIT/ML IJ SOLN
0.0000 [IU] | INTRAMUSCULAR | Status: DC
  Administered 2020-11-07: 2 [IU] via SUBCUTANEOUS
  Administered 2020-11-07: 1 [IU] via SUBCUTANEOUS
  Administered 2020-11-07: 5 [IU] via SUBCUTANEOUS
  Administered 2020-11-07: 1 [IU] via SUBCUTANEOUS
  Administered 2020-11-08: 5 [IU] via SUBCUTANEOUS
  Administered 2020-11-08: 7 [IU] via SUBCUTANEOUS
  Administered 2020-11-08: 5 [IU] via SUBCUTANEOUS
  Administered 2020-11-08: 7 [IU] via SUBCUTANEOUS

## 2020-11-07 MED ORDER — BARIUM SULFATE 2.1 % PO SUSP
ORAL | Status: AC
  Filled 2020-11-07: qty 2

## 2020-11-07 MED ORDER — POTASSIUM CHLORIDE 10 MEQ/100ML IV SOLN
10.0000 meq | INTRAVENOUS | Status: AC
Start: 2020-11-07 — End: 2020-11-07
  Administered 2020-11-07 (×2): 10 meq via INTRAVENOUS
  Filled 2020-11-07 (×2): qty 100

## 2020-11-07 MED ORDER — SCOPOLAMINE 1 MG/3DAYS TD PT72
1.0000 | MEDICATED_PATCH | TRANSDERMAL | Status: DC
  Administered 2020-11-07: 1.5 mg via TRANSDERMAL
  Filled 2020-11-07: qty 1

## 2020-11-07 MED ORDER — ISOSORBIDE MONONITRATE ER 60 MG PO TB24
60.0000 mg | ORAL_TABLET | Freq: Every day | ORAL | Status: DC
  Administered 2020-11-08: 60 mg via ORAL
  Filled 2020-11-07: qty 1
  Filled 2020-11-07: qty 2

## 2020-11-07 MED ORDER — POTASSIUM CHLORIDE CRYS ER 20 MEQ PO TBCR
40.0000 meq | EXTENDED_RELEASE_TABLET | Freq: Once | ORAL | Status: DC
  Filled 2020-11-07: qty 2

## 2020-11-07 MED ORDER — APIXABAN 5 MG PO TABS
5.0000 mg | ORAL_TABLET | Freq: Two times a day (BID) | ORAL | Status: DC
  Administered 2020-11-07 – 2020-11-08 (×2): 5 mg via ORAL
  Filled 2020-11-07 (×4): qty 1

## 2020-11-07 MED ORDER — NITROGLYCERIN 0.4 MG SL SUBL: 0.4000 mg | SUBLINGUAL_TABLET | SUBLINGUAL | Status: DC | PRN

## 2020-11-07 MED ORDER — DULOXETINE HCL 20 MG PO CPEP
40.0000 mg | ORAL_CAPSULE | Freq: Two times a day (BID) | ORAL | Status: DC
  Administered 2020-11-07 – 2020-11-08 (×2): 40 mg via ORAL
  Filled 2020-11-07 (×5): qty 2

## 2020-11-07 MED ORDER — SPIRONOLACTONE 12.5 MG HALF TABLET
12.5000 mg | ORAL_TABLET | Freq: Every day | ORAL | Status: DC
  Administered 2020-11-08: 12.5 mg via ORAL
  Filled 2020-11-07 (×4): qty 1

## 2020-11-07 MED ORDER — METOPROLOL SUCCINATE ER 25 MG PO TB24
25.0000 mg | ORAL_TABLET | Freq: Every day | ORAL | Status: DC
  Administered 2020-11-07 – 2020-11-08 (×2): 25 mg via ORAL
  Filled 2020-11-07 (×3): qty 1

## 2020-11-07 MED ORDER — HYDROMORPHONE HCL 2 MG PO TABS
4.0000 mg | ORAL_TABLET | Freq: Two times a day (BID) | ORAL | Status: DC | PRN
Start: 2020-11-07 — End: 2020-11-08
  Administered 2020-11-08: 4 mg via ORAL
  Filled 2020-11-07: qty 2

## 2020-11-07 MED ORDER — ATORVASTATIN CALCIUM 10 MG PO TABS: 10.0000 mg | ORAL_TABLET | Freq: Every day | ORAL | Status: DC

## 2020-11-07 MED ORDER — HYDRALAZINE HCL 50 MG PO TABS
50.0000 mg | ORAL_TABLET | Freq: Three times a day (TID) | ORAL | Status: DC
  Administered 2020-11-07 – 2020-11-08 (×2): 50 mg via ORAL
  Filled 2020-11-07: qty 1
  Filled 2020-11-07: qty 2
  Filled 2020-11-07: qty 1

## 2020-11-07 MED ORDER — SODIUM CHLORIDE 0.9 % IV SOLN
12.5000 mg | Freq: Once | INTRAVENOUS | Status: AC | PRN
  Administered 2020-11-08: 12.5 mg via INTRAVENOUS
  Filled 2020-11-07 (×2): qty 0.5

## 2020-11-07 MED ORDER — HYDROMORPHONE HCL 1 MG/ML IJ SOLN
0.5000 mg | INTRAMUSCULAR | Status: DC | PRN
  Administered 2020-11-07 – 2020-11-08 (×4): 0.5 mg via INTRAVENOUS
  Filled 2020-11-07 (×4): qty 1

## 2020-11-07 MED ORDER — LACTATED RINGERS IV SOLN: INTRAVENOUS | Status: AC

## 2020-11-07 MED ORDER — ONDANSETRON HCL 4 MG/2ML IJ SOLN
4.0000 mg | Freq: Four times a day (QID) | INTRAMUSCULAR | Status: DC | PRN
  Administered 2020-11-07 – 2020-11-08 (×3): 4 mg via INTRAVENOUS
  Filled 2020-11-07 (×3): qty 2

## 2020-11-07 MED ORDER — HYDROMORPHONE HCL 1 MG/ML IJ SOLN
0.5000 mg | Freq: Once | INTRAMUSCULAR | Status: AC
  Administered 2020-11-07: 0.5 mg via INTRAVENOUS
  Filled 2020-11-07: qty 1

## 2020-11-07 NOTE — H&P (Addendum)
History and Physical    Deborah [name] GQQ:[id number] DOB: [birth year] DOA: 11/06/2020  PCP: Cena Benton, MD  Patient coming from: Home.  Chief Complaint: Nausea vomiting.  HPI: Deborah [name] is a 55 y.o. female with history of diabetes mellitus type 2 last hemoglobin A1c was around 8.8 in March 2022 who has had multiple admissions for nausea vomiting attributed to gastroparesis with prior history of duodenal carcinoid status postresection recent CT scan did not show anything acute presents to the ER with 2 days of nausea vomiting unable to keep anything.  Patient also has diffuse abdominal discomfort which usually happens with the nausea vomiting.  Denies any fever chills or diarrhea.  Denies chest pain or shortness of breath.  ED Course: In the ER patient is afebrile her abdomen appears benign on exam.  Labs show potassium of 3.2 bicarb of 17 anion gap of 11 with venous blood gas showing pH of 7.4 WBC count is 13.3 hemoglobin 10.5 KUB unremarkable EKG showing sinus tachycardia patient admitted for intractable nausea vomiting likely from diabetic gastroparesis.  COVID test is pending.  Review of Systems: As per HPI, rest all negative.   Past Medical History:  Diagnosis Date   Acute back pain with sciatica, left    Acute back pain with sciatica, right    AKI (acute kidney injury) ([place])    Anemia, unspecified    Cancer (HCC)    Carcinoid tumor of duodenum    Chronic pain    Chronic systolic CHF (congestive heart failure) (HCC)    Diabetes mellitus    Esophageal reflux    Fibromyalgia    Gastric ulcer    Gastroparesis    Gout    Hyperlipidemia    Hypertension    Lumbosacral stenosis    NICM (nonischemic cardiomyopathy) (HCC)    Obesity    PAF (paroxysmal atrial fibrillation) ([place])    Stroke ([place]) 02/2011   Vitamin B12 deficiency anemia     Past Surgical History:  Procedure Laterality Date   BIOPSY  07/27/2019   Procedure: BIOPSY;  Surgeon: Clarene Essex,  MD;  Location: [facility];  Service: Endoscopy;;   BIOPSY  07/30/2019   Procedure: BIOPSY;  Surgeon: Otis Brace, MD;  Location: [facility];  Service: Gastroenterology;;   CATARACT EXTRACTION  01/2014   CHOLECYSTECTOMY     COLONOSCOPY WITH PROPOFOL N/A 07/30/2019   Procedure: COLONOSCOPY WITH PROPOFOL;  Surgeon: Otis Brace, MD;  Location: [facility];  Service: Gastroenterology;  Laterality: N/A;   ESOPHAGOGASTRODUODENOSCOPY N/A 07/27/2019   Procedure: ESOPHAGOGASTRODUODENOSCOPY (EGD);  Surgeon: Clarene Essex, MD;  Location: [facility];  Service: Endoscopy;  Laterality: N/A;   ESOPHAGOGASTRODUODENOSCOPY N/A 07/26/2020   Procedure: ESOPHAGOGASTRODUODENOSCOPY (EGD);  Surgeon: Arta Silence, MD;  Location: [facility];  Service: Endoscopy;  Laterality: N/A;   ESOPHAGOGASTRODUODENOSCOPY (EGD) WITH PROPOFOL N/A 08/02/2019   Procedure: ESOPHAGOGASTRODUODENOSCOPY (EGD) WITH PROPOFOL;  Surgeon: Otis Brace, MD;  Location: [facility];  Service: Gastroenterology;  Laterality: N/A;   HEMOSTASIS CLIP PLACEMENT  08/02/2019   Procedure: HEMOSTASIS CLIP PLACEMENT;  Surgeon: Otis Brace, MD;  Location: [facility];  Service: Gastroenterology;;   POLYPECTOMY  07/30/2019   Procedure: POLYPECTOMY;  Surgeon: Otis Brace, MD;  Location: [facility];  Service: Gastroenterology;;   POLYPECTOMY  08/02/2019   Procedure: POLYPECTOMY;  Surgeon: Otis Brace, MD;  Location: [facility];  Service: Gastroenterology;;     reports that she has never smoked. She has never used smokeless tobacco. She reports that she does not drink alcohol and  does not use drugs.  Allergies  Allergen Reactions   Diazepam [name]   Gabapentin [name] and Swelling    Other reaction(s): Unknown   Iodinated Diagnostic Agents Anaphylaxis    11/29/17 Cardiac arrest 1 min after IV contrast, possible allergy vs vasovagal episode Iopamidol  Anaphylaxis  High 11/28/2017  Patient  had seizure like activity and then code post 100 cc of isovue 300     Isovue [Iopamidol] Anaphylaxis    11/28/17 Patient had seizure like activity and then 1 min code after 100 cc of isovue 300. Possible contrast allergy vs vasovagal episode   Lisinopril Anaphylaxis    Tongue and mouth swelling   Metoclopramide Other (See Comments)    Tardive dyskinesia   Nsaids Anaphylaxis and Other (See Comments)    ULCER   Penicillins Palpitations    Has patient had a PCN reaction causing immediate rash, facial/tongue/throat swelling, SOB or lightheadedness with hypotension: Yes, heart races Has patient had a PCN reaction causing severe rash involving mucus membranes or skin necrosis: No Has patient had a PCN reaction that required hospitalization: Yes  Has patient had a PCN reaction occurring within the last 10 years: No    Tolmetin Nausea Only and Other (See Comments)    ULCER   Aspirin Other (See Comments)    Irritates stomach ulcer    Cyclobenzaprine     Other reaction(s): Unknown   Rifamycins    Acetaminophen Nausea Only and Other (See Comments)    Irritates stomach ulcer; Abdominal pain   Oxycodone Palpitations   Tramadol Nausea And Vomiting    Family History  Problem Relation Age of Onset   Diabetes Mother    Diabetes Father    Heart disease Father    Diabetes Sister    Congestive Heart Failure Sister 64   Diabetes Brother     Prior to Admission medications   Medication Sig Start Date End Date Taking? Authorizing Provider  albuterol (PROVENTIL) (2.5 MG/3ML) 0.083% nebulizer solution Take 3 mLs (2.5 mg total) by nebulization every 6 (six) hours as needed for wheezing or shortness of breath. 04/06/19  Yes Scot Jun, FNP  atorvastatin (LIPITOR) 10 MG tablet TAKE ONE TABLET BY MOUTH ONCE DAILY (AM) 10/22/20  Yes Alcus Dad, MD  DULoxetine HCl 40 MG CPEP TAKE ONE CAPSULE BY MOUTH TWICE DAILY 10/22/20  Yes Alcus Dad, MD  ELIQUIS 5 MG TABS tablet TAKE 1 TABLET BY MOUTH 2  (TWO) TIMES DAILY. 12/20/19  Yes Meccariello, Bernita Raisin, DO  fluticasone (FLONASE) 50 MCG/ACT nasal spray Place 2 sprays into both nostrils daily as needed for allergies or rhinitis. 12/19/18  Yes Rai, Ripudeep K, MD  hydrALAZINE (APRESOLINE) 50 MG tablet Take 1 tablet (50 mg total) by mouth 3 (three) times daily. 09/10/20  Yes Alcus Dad, MD  HYDROmorphone (DILAUDID) 4 MG tablet Take 1 tablet (4 mg total) by mouth 2 (two) times daily as needed for severe pain. 11/04/20  Yes Raulkar, Clide Deutscher, MD  isosorbide mononitrate (IMDUR) 60 MG 24 hr tablet TAKE 1 TABLET (60 MG TOTAL) BY MOUTH DAILY. 09/10/20  Yes Alcus Dad, MD  LANTUS SOLOSTAR 100 UNIT/ML Solostar Pen Inject 40 Units into the skin daily. Patient taking differently: Inject 50 Units into the skin at bedtime. 10/13/20 11/14/20 Yes Dwyane Dee, MD  melatonin 3 MG TABS tablet TAKE 2 TABLETS (6 MG TOTAL) BY MOUTH AT BEDTIME. 08/11/20  Yes Meccariello, Bernita Raisin, DO  metoprolol succinate (TOPROL-XL) 25 MG 24 hr  tablet Take 25 mg by mouth daily. 09/05/20  Yes [provider]  nitroGLYCERIN (NITROSTAT) 0.4 MG SL tablet Place 1 tablet (0.4 mg total) under the tongue every 5 (five) minutes as needed for chest pain. 12/13/18  Yes Lockamy, Timothy, DO  NOVOLOG FLEXPEN 100 UNIT/ML FlexPen Inject 1-15 Units into the skin in the morning, at noon, in the evening, and at bedtime. CBG 121 - 150: 2 units, CBG 151 - 200: 3 units, CBG 201 - 250: 5 units, CBG 251 - 300: 8 units, CBG 301 - 350: 11 units, CBG 351 - 400: 15 units, CBG > 400 call MD Patient taking differently: Inject 30 Units into the skin in the morning, at noon, in the evening, and at bedtime. 10/13/20  Yes Dwyane Dee, MD  pantoprazole (PROTONIX) 40 MG tablet TAKE 1 TABLET (40 MG TOTAL) BY MOUTH 2 (TWO) TIMES DAILY. 10/22/20  Yes Alcus Dad, MD  promethazine (PHENERGAN) 12.5 MG tablet Take 1 tablet (12.5 mg total) by mouth every 6 (six) hours as needed for nausea or vomiting. 07/27/20  Yes  Shelly Coss, MD  spironolactone (ALDACTONE) 25 MG tablet Take 12.5 mg by mouth daily.   Yes [provider]  torsemide (DEMADEX) 20 MG tablet Take 2 tablets (40 mg total) by mouth daily. 07/27/20  Yes Shelly Coss, MD  blood glucose meter kit and supplies KIT Dispense based on patient and insurance preference. Use up to four times daily as directed. (FOR ICD-9 250.00, 250.01). 12/13/18   [name], DO  cetirizine (ZYRTEC) 10 MG tablet Take 10 mg by mouth daily. 10/21/20   [provider]  EASY COMFORT PEN NEEDLES 31G X 5 MM MISC USE 3 TIMES A DAY FOR INSULIN ADMINISTRATION 11/14/19   Meccariello, Bernita Raisin, DO  metoCLOPramide (REGLAN) 5 MG tablet Take 5 mg by mouth 4 (four) times daily. 10/21/20   [provider]  ondansetron (ZOFRAN ODT) 4 MG disintegrating tablet Take 1 tablet (4 mg total) by mouth every 8 (eight) hours as needed for nausea or vomiting. 09/14/20   Tedd Sias, PA    Physical Exam: Constitutional: Moderately built and nourished. Vitals:   11/06/20 2130 11/06/20 2200 11/06/20 2215 11/06/20 2350  BP: 115/79 (!) 129/105 (!) 116/56 (!) 144/112  Pulse: 92 91 86 92  Resp:   20 18  SpO2: 100% 100% 100% 100%   Eyes: Anicteric no pallor. ENMT: No discharge from the ears eyes nose and mouth. Neck: No mass felt.  No neck rigidity. Respiratory: No rhonchi or crepitations. Cardiovascular: S1-S2 heard. Abdomen: Soft nontender bowel sound present. Musculoskeletal: No edema. Skin: No rash. Neurologic: Alert awake oriented to time place and person.  Moves all extremities. Psychiatric: Appears normal.  Normal affect.   Labs on Admission: I have personally reviewed following labs and imaging studies  CBC: Recent Labs  Lab 11/06/20 1633 11/06/20 1644  WBC 13.3*  --   NEUTROABS 9.2*  --   HGB 10.5* 10.9*  HCT 33.0* 32.0*  MCV 88.7  --   PLT 344  --    Basic Metabolic Panel: Recent Labs  Lab 11/06/20 1633 11/06/20 1644  NA 140 143   K 3.2* 3.2*  CL 112*  --   CO2 17*  --   GLUCOSE 101*  --   BUN 22*  --   CREATININE 1.31*  --   CALCIUM 9.7  --    GFR: Estimated Creatinine Clearance: 68 mL/min (A) (by C-G formula based on SCr of 1.31  mg/dL (H)). Liver Function Tests: Recent Labs  Lab 11/06/20 1633  AST 20  ALT 18  ALKPHOS 104  BILITOT 0.7  PROT 7.9  ALBUMIN 3.2*   Recent Labs  Lab 11/06/20 1633  LIPASE 40   No results for input(s): AMMONIA in the last 168 hours. Coagulation Profile: No results for input(s): INR, PROTIME in the last 168 hours. Cardiac Enzymes: No results for input(s): CKTOTAL, CKMB, CKMBINDEX, TROPONINI in the last 168 hours. BNP (last 3 results) No results for input(s): PROBNP in the last 8760 hours. HbA1C: No results for input(s): HGBA1C in the last 72 hours. CBG: No results for input(s): GLUCAP in the last 168 hours. Lipid Profile: No results for input(s): CHOL, HDL, LDLCALC, TRIG, CHOLHDL, LDLDIRECT in the last 72 hours. Thyroid Function Tests: No results for input(s): TSH, T4TOTAL, FREET4, T3FREE, THYROIDAB in the last 72 hours. Anemia Panel: No results for input(s): VITAMINB12, FOLATE, FERRITIN, TIBC, IRON, RETICCTPCT in the last 72 hours. Urine analysis:    Component Value Date/Time   COLORURINE YELLOW 10/22/2020 [place] 10/22/2020 1641   LABSPEC 1.010 10/22/2020 1641   PHURINE 6.0 10/22/2020 1641   GLUCOSEU 150 (A) 10/22/2020 1641   HGBUR NEGATIVE 10/22/2020 [place] 10/22/2020 1641   KETONESUR NEGATIVE 10/22/2020 1641   PROTEINUR 100 (A) 10/22/2020 1641   UROBILINOGEN 0.2 10/02/2013 2108   NITRITE NEGATIVE 10/22/2020 1641   LEUKOCYTESUR NEGATIVE 10/22/2020 1641   Sepsis Labs: '@LABRCNTIP' (procalcitonin:4,lacticidven:4) )No results found for this or any previous visit (from the past 240 hour(s)).   Radiological Exams on Admission: DG Abd 1 View  Result Date: 11/06/2020 CLINICAL DATA:  Abdominal pain. EXAM: ABDOMEN - 1  VIEW COMPARISON:  Radiograph dated 10/07/2020 CT dated 10/22/2020 FINDINGS: No bowel dilatation or evidence of obstruction. No free air or radiopaque calculi. Right upper quadrant cholecystectomy clips. Degenerative changes of the spine. No acute osseous pathology. IMPRESSION: Negative. Electronically Signed   By: Anner Crete M.D.   On: 11/06/2020 23:38    EKG: Independently reviewed.  Sinus tachycardia.  Assessment/Plan Principal Problem:   Nausea & vomiting Active Problems:   Essential hypertension, benign   Uncontrolled type 2 diabetes mellitus with diabetic neuropathy, with long-term current use of insulin (HCC)   Chronic kidney disease, stage 3b (HCC)    Intractable nausea vomiting likely from diabetic gastroparesis.  Patient is intolerant to Reglan.  We will keep patient on scopolamine patch which was effective last time.  Closely monitor CBGs. Diabetes mellitus type 2 usually takes Lantus presently on hold on CBG with sliding scale coverage.  Once patient can take orally will restart Lantus.  Last hemoglobin A1c was 8.8 in March 2022. History of A. fib on metoprolol and apixaban. History of hypertension on Imdur metoprolol hydralazine. History of chronic systolic heart failure on torsemide which is on hold can restart morning if patient does not appear dehydrated.  Patient also take spironolactone.  Gently hydrating overnight. Chronic kidney disease stage III with non-anion gap metabolic acidosis with mildly elevated creatinine presently gently hydrating holding torsemide.  Follow metabolic panel. History of carcinoid tumor of the duodenum status post endoscopic mucosal resection.  CT scan of the abdomen pelvis done without contrast on October 22, 2020 did not show anything acute.  COVID test is pending.   DVT prophylaxis: Apixaban. Code Status: Full code. Family Communication: Discussed with patient. Disposition Plan: Home. Consults called: None. Admission status: Observation  per   Rise Patience MD [facility] Pager  [place] [phone].  If 7PM-7AM, please contact night-coverage [email or web address] Password [facility]  11/07/2020, 12:25 AM

## 2020-11-07 NOTE — ED Notes (Signed)
Pt refusing CT contrast and all PO meds at this time

## 2020-11-07 NOTE — Progress Notes (Signed)
   Patient seen and examined at bedside, patient admitted after midnight, please see earlier detailed admission note by Rise Patience, MD. Briefly, patient presented chest pain, abdominal pain, nausea/vomiting.  Subjective: Patient reports chest pain, abdominal pain and nausea. Initially stated no vomiting, but then recanted after discussion about IV narcotics and transitioning to oral medications since no vomiting episodes overnight. She states her abdominal pain is achy which is different than her regular sharp chronic abdominal pain. Chest pain is described as sharp but she does not localize it well.  BP (!) 145/92   Pulse 92   Temp 98.3 F (36.8 C) (Oral)   Resp 16   LMP 10/10/2012   SpO2 99%   General exam: Appears calm and comfortable. Not ill-appearing. Respiratory system: Clear to auscultation. Respiratory effort normal. Cardiovascular system: S1 & S2 heard, RRR. Gastrointestinal system: Abdomen is nondistended, soft and generally tender. No organomegaly or masses felt. Normal bowel sounds heard. Central nervous system: Alert and oriented. No focal neurological deficits. Musculoskeletal: No calf tenderness. Significant reproducible chest pain. Skin: No cyanosis. No rashes Psychiatry: Judgement and insight appear normal. Mood & affect appropriate.   Brief assessment/Plan:  Abdominal pain Patient has chronic pain but states this is an acute on chronic pain. No abnormal bowel movements. Abdominal x-ray unremarkable. -CT abdomen/pelvis -Continue home hydromorphone PO -IV hydromorphone only if actively vomiting as to not exacerbate gastroparesis and secondary to lack of indication  Nausea/vomiting Patient with a history of gastroparesis. Unsure if this is the etiology. Non-specific abdominal pain. Scopolamine patch applied on admission. Patient has a history of intolerance to Reglan. -Continue scopolamine patch -Zofran prn -Full liquid diet  Chest pain Prior to  presentation to ED. While in the ED, chest pain had resolved. Troponin mildly elevated at 30 with flat delta trend.  CKD stage IIIb Stable.  Diabetes mellitus, type 2 Hemoglobin A1C of 7.8%. Patient is on Lantus 50 units QHS as an outpatient. Started on SSI inpatient -Continue SSI  History of atrial fibrillation -Continue metoprolol and Eliquis  Chronic systolic heart failure EF of 40%. Stable. Patient is on Toprol XL, torsemide and spironolactone as an outpatient -Continue home Spironolactone, Toprol XL  Morbid obesity Last recorded BMI of 48.26 kg/m2 from 11/04/2020   Family communication: None at bedside DVT prophylaxis: Eliquis Disposition: Discharge home once able to tolerate PO intake  Cordelia Poche, MD [facility] 11/07/2020, 9:19 AM

## 2020-11-07 NOTE — Progress Notes (Signed)
Receive pt with Yellow MEWS. HR was high. MD was notified and called to give PO Metoprolol or give IV if refuse. Pt took her PO Metoprolol. No Narcotics IV to be given. Will monitor pt's HR and VS.  11/07/20 1815  Vitals  Temp 97.9 F (36.6 C)  BP 120/67  MAP (mmHg) 76  BP Location Left Arm  BP Method Automatic  Patient Position (if appropriate) Sitting  Pulse Rate (!) 123  Resp 18  MEWS COLOR  MEWS Score Color Yellow  Oxygen Therapy  SpO2 100 %  O2 Device Room Air  MEWS Score  MEWS Temp 0  MEWS Systolic 0  MEWS Pulse 2  MEWS RR 0  MEWS LOC 0  MEWS Score 2  Provider Notification  Provider Name/Title Nettey  Date Provider Notified 11/07/20  Time Provider Notified 9842  Notification Type Call  Notification Reason Other (Comment) (high HR)  Provider response Other (Comment) (pt was asked to take PO Metoprolol. will give IV if refused. Pt took her PO Metoprolol. No IV Narcotics.)  Date of Provider Response 11/07/20  Time of Provider Response 1850

## 2020-11-07 NOTE — ED Notes (Signed)
Pt is refusing all PO meds at this time, no reason given other than she doesn't want them

## 2020-11-07 NOTE — ED Notes (Signed)
MD paged at this time to get nausea med order. Waiting for MD to call back.

## 2020-11-07 NOTE — ED Notes (Signed)
Pt states she is nauseous and is unable to take PO meds. MD notified at this time to ordere PRN for nausea. Pt is insisting to get pain meds despite RN telling pt to wait for nausea meds as dilaudid can make her more nauseous. Pt states, "Just give me the pain meds please." Will continue to monitor.

## 2020-11-08 DIAGNOSIS — E1165 Type 2 diabetes mellitus with hyperglycemia: Secondary | ICD-10-CM

## 2020-11-08 DIAGNOSIS — I10 Essential (primary) hypertension: Secondary | ICD-10-CM

## 2020-11-08 DIAGNOSIS — Z794 Long term (current) use of insulin: Secondary | ICD-10-CM

## 2020-11-08 DIAGNOSIS — E1140 Type 2 diabetes mellitus with diabetic neuropathy, unspecified: Secondary | ICD-10-CM

## 2020-11-08 LAB — GLUCOSE, CAPILLARY
Glucose-Capillary: 251 mg/dL — ABNORMAL HIGH (ref 70–99)
Glucose-Capillary: 298 mg/dL — ABNORMAL HIGH (ref 70–99)
Glucose-Capillary: 304 mg/dL — ABNORMAL HIGH (ref 70–99)
Glucose-Capillary: 258 mg/dL — ABNORMAL HIGH (ref 70–99)

## 2020-11-08 MED ORDER — HYDROMORPHONE HCL 2 MG PO TABS
4.0000 mg | ORAL_TABLET | ORAL | Status: DC | PRN
Start: 2020-11-08 — End: 2020-11-08
  Filled 2020-11-08: qty 2

## 2020-11-08 MED ORDER — SODIUM CHLORIDE 0.9 % IV BOLUS
500.0000 mL | Freq: Once | INTRAVENOUS | Status: AC
  Administered 2020-11-08: 500 mL via INTRAVENOUS

## 2020-11-08 NOTE — Discharge Instructions (Signed)
Deborah [name],  You were in the hospital with:  Chest pain: This appears to be related to the muscles/bones of your body. Your heart workup was negative, thankfully  Abdominal pain: This was described by you as acute pain, different from your chronic pain. Your CT test was negative. I discussed your situation with the general surgeon who did not think exploring for a possible reason via surgery would be beneficial for you and did not see anything that would be causing your pain. Please follow-up with your primary care physician for continued workup/management.  Nausea/vomiting: You have mentioned that this is not related to your previous diagnosis of gastroparesis. You mentioned several episodes of emesis. This could not be quantified, unfortunately but appears to have been managed with Zofran. Please continue your outpatient Zofran to manage your nausea.  We had a lengthy discussion with regard to the appropriateness of prescribing IV dilaudid for the treatment of your pain symptoms without identification of a disease process. With a lack of diagnosis and ability for you to tolerate oral pills, there was no indication for IV narcotics. I recommend that you continue to work with oyur PCP in finding out what could be causing your abdominal pain.

## 2020-11-08 NOTE — Progress Notes (Signed)
  Yellow mews at this time. HR 120, although patient is in pain, ordered dilaudid administered. Morning BP meds administered, MD made aware will continue to monitor.  11/08/20 0900  Vitals  Temp 98 F (36.7 C)  Temp Source Axillary  BP 136/84  MAP (mmHg) 98  BP Location Left Arm  BP Method Automatic  Patient Position (if appropriate) Lying  Pulse Rate (!) 120  Pulse Rate Source Monitor  Resp 18  Level of Consciousness  Level of Consciousness Alert  MEWS COLOR  MEWS Score Color Yellow  Oxygen Therapy  SpO2 100 %  O2 Device Room Air  Pain Assessment  Pain Scale 0-10  Pain Score 10  MEWS Score  MEWS Temp 0  MEWS Systolic 0  MEWS Pulse 2  MEWS RR 0  MEWS LOC 0  MEWS Score 2  Provider Notification  Provider Name/Title Netty  Date Provider Notified 11/08/20  Time Provider Notified 0910  Notification Type Page  Notification Reason Other (Comment) (High HR)  Provider response Other (Comment) (awaiting response)

## 2020-11-08 NOTE — Progress Notes (Signed)
Patient is requesting to leave. Education reinforced but she insists on leaving. MD informed.

## 2020-11-08 NOTE — Progress Notes (Signed)
Patient continues to be in continuous pain 10/10. PO dilaudid order but patient endorses that it is ineffective for her pain and refuses to take it. Education regarding proper pain management was reviewed with patient as well as her tachycardia which pain can be a contributing factor to, but she still refuses. Will continue to monitor and notify MD.

## 2020-11-08 NOTE — Discharge Summary (Signed)
Physician Discharge Summary  Deborah [name] XBL:[id number] DOB: [birth year] DOA: 11/06/2020  PCP: Cena Benton, MD  Admit date: 11/06/2020 Discharge date: 11/08/2020  Admitted From: Home Disposition: Home  Recommendations for Outpatient Follow-up:  Follow up with PCP in 1 week Continued workup for abdominal pain Please follow up on the following pending results: None  Home Health: None Equipment/Devices: None  Discharge Condition: Guarded CODE STATUS: Full code Diet recommendation: Full liquid with advancement as able   Brief/Interim Summary:  Admission HPI written by Rise Patience, MD   HPI: Deborah [name] is a 55 y.o. female with history of diabetes mellitus type 2 last hemoglobin A1c was around 8.8 in March 2022 who has had multiple admissions for nausea vomiting attributed to gastroparesis with prior history of duodenal carcinoid status postresection recent CT scan did not show anything acute presents to the ER with 2 days of nausea vomiting unable to keep anything.  Patient also has diffuse abdominal discomfort which usually happens with the nausea vomiting.  Denies any fever chills or diarrhea.  Denies chest pain or shortness of breath.   Hospital course:  Abdominal pain Patient has chronic pain but states this is an acute on chronic pain. No abnormal bowel movements. Abdominal x-ray unremarkable.  Because patient states that this pain is different than her normal, repeat CT abdomen and pelvis was ordered even though patient has had several in the last year alone.  CT abdomen/pelvis was significant for no acute process.  Patient continued to express lower abdominal pain and was adamant that she required IV Dilaudid for pain management.  General surgery was curb sided and reviewed the patient's CT abdomen/pelvis and per their assessment, no recommendation for exploratory laparotomy.  Since no etiology for her pain could be identified and patient had no  objective evidence of recurrent emesis, informed her that she would be managed with oral Dilaudid rather than IV Dilaudid.  Patient was in disagreement and requested that she be discharged from the hospital.   Nausea/vomiting Patient with a history of gastroparesis. Unsure if this is the etiology. Non-specific abdominal pain. Scopolamine patch applied on admission. Patient has a history of intolerance to Reglan. Patient was managed with Zofran as needed.  Patient reported multiple episodes of emesis, however continued to fail to produce evidence of emesis.  Patient was repeatedly directed to vomit into blue emesis bag for quantification of volume of emesis.   Chest pain Prior to presentation to ED. While in the ED, chest pain had resolved. Troponin mildly elevated at 30 with flat delta trend.   CKD stage IIIb Stable.   Diabetes mellitus, type 2 Hemoglobin A1C of 7.8%. Patient is on Lantus 50 units QHS as an outpatient. Started on SSI inpatient.  Continue Lantus as an outpatient.   History of atrial fibrillation Continue metoprolol and Eliquis   Chronic systolic heart failure EF of 40%. Stable. Patient is on Toprol XL, torsemide and spironolactone as an outpatient. Continue home Spironolactone, Toprol XL   Morbid obesity Body mass index is 45.19 kg/m.  Discharge Diagnoses:  Principal Problem:   Nausea & vomiting Active Problems:   Essential hypertension, benign   Uncontrolled type 2 diabetes mellitus with diabetic neuropathy, with long-term current use of insulin (HCC)   Chronic kidney disease, stage 3b (HCC)   Nausea and vomiting    Discharge Instructions  Discharge Instructions     Increase activity slowly   Complete by: As directed  Allergies as of 11/08/2020       Reactions   Diazepam [name]   Gabapentin [name], Swelling   Other reaction(s): Unknown   Iodinated Diagnostic Agents Anaphylaxis   11/29/17 Cardiac arrest 1 min after IV  contrast, possible allergy vs vasovagal episode Iopamidol  Anaphylaxis  High 11/28/2017  Patient had seizure like activity and then code post 100 cc of isovue 300     Isovue [iopamidol] Anaphylaxis   11/28/17 Patient had seizure like activity and then 1 min code after 100 cc of isovue 300. Possible contrast allergy vs vasovagal episode   Lisinopril Anaphylaxis   Tongue and mouth swelling   Metoclopramide Other (See Comments)   Tardive dyskinesia   Nsaids Anaphylaxis, Other (See Comments)   ULCER   Penicillins Palpitations   Has patient had a PCN reaction causing immediate rash, facial/tongue/throat swelling, SOB or lightheadedness with hypotension: Yes, heart races Has patient had a PCN reaction causing severe rash involving mucus membranes or skin necrosis: No Has patient had a PCN reaction that required hospitalization: Yes  Has patient had a PCN reaction occurring within the last 10 years: No   Tolmetin Nausea Only, Other (See Comments)   ULCER   Aspirin Other (See Comments)   Irritates stomach ulcer    Cyclobenzaprine    Other reaction(s): Unknown   Rifamycins    Acetaminophen Nausea Only, Other (See Comments)   Irritates stomach ulcer; Abdominal pain   Oxycodone Palpitations   Tramadol Nausea And Vomiting        Medication List     TAKE these medications    albuterol (2.5 MG/3ML) 0.083% nebulizer solution Commonly known as: PROVENTIL Take 3 mLs (2.5 mg total) by nebulization every 6 (six) hours as needed for wheezing or shortness of breath.   atorvastatin 10 MG tablet Commonly known as: LIPITOR TAKE ONE TABLET BY MOUTH ONCE DAILY (AM) What changed: See the new instructions.   blood glucose meter kit and supplies Kit Dispense based on patient and insurance preference. Use up to four times daily as directed. (FOR ICD-9 250.00, 250.01).   cetirizine 10 MG tablet Commonly known as: ZYRTEC Take 10 mg by mouth daily.   DULoxetine HCl 40 MG Cpep TAKE ONE CAPSULE BY  MOUTH TWICE DAILY What changed:  how much to take when to take this   Easy Comfort Pen Needles 31G X 5 MM Misc Generic drug: Insulin Pen Needle USE 3 TIMES A DAY FOR INSULIN ADMINISTRATION   Eliquis 5 MG Tabs tablet Generic drug: apixaban TAKE 1 TABLET BY MOUTH 2 (TWO) TIMES DAILY. What changed: how much to take   fluticasone 50 MCG/ACT nasal spray Commonly known as: FLONASE Place 2 sprays into both nostrils daily as needed for allergies or rhinitis.   hydrALAZINE 50 MG tablet Commonly known as: APRESOLINE Take 1 tablet (50 mg total) by mouth 3 (three) times daily.   HYDROmorphone 4 MG tablet Commonly known as: Dilaudid Take 1 tablet (4 mg total) by mouth 2 (two) times daily as needed for severe pain.   isosorbide mononitrate 60 MG 24 hr tablet Commonly known as: IMDUR TAKE 1 TABLET (60 MG TOTAL) BY MOUTH DAILY.   Lantus SoloStar 100 UNIT/ML Solostar Pen Generic drug: insulin glargine Inject 40 Units into the skin daily. What changed:  how much to take when to take this   melatonin 3 MG Tabs tablet TAKE 2 TABLETS (6 MG TOTAL) BY MOUTH AT BEDTIME.   metoprolol succinate 25 MG  24 hr tablet Commonly known as: TOPROL-XL Take 25 mg by mouth daily.   nitroGLYCERIN 0.4 MG SL tablet Commonly known as: NITROSTAT Place 1 tablet (0.4 mg total) under the tongue every 5 (five) minutes as needed for chest pain.   NovoLOG FlexPen 100 UNIT/ML FlexPen Generic drug: insulin aspart Inject 1-15 Units into the skin in the morning, at noon, in the evening, and at bedtime. CBG 121 - 150: 2 units, CBG 151 - 200: 3 units, CBG 201 - 250: 5 units, CBG 251 - 300: 8 units, CBG 301 - 350: 11 units, CBG 351 - 400: 15 units, CBG > 400 call MD What changed:  how much to take additional instructions   ondansetron 4 MG disintegrating tablet Commonly known as: Zofran ODT Take 1 tablet (4 mg total) by mouth every 8 (eight) hours as needed for nausea or vomiting.   pantoprazole 40 MG  tablet Commonly known as: PROTONIX TAKE 1 TABLET (40 MG TOTAL) BY MOUTH 2 (TWO) TIMES DAILY.   promethazine 12.5 MG tablet Commonly known as: PHENERGAN Take 1 tablet (12.5 mg total) by mouth every 6 (six) hours as needed for nausea or vomiting.   spironolactone 25 MG tablet Commonly known as: ALDACTONE Take 12.5 mg by mouth daily.   tiZANidine 4 MG tablet Commonly known as: ZANAFLEX Take 4 mg by mouth 3 (three) times daily as needed for muscle spasms.   torsemide 20 MG tablet Commonly known as: DEMADEX Take 2 tablets (40 mg total) by mouth daily.        Follow-up Information     Cena Benton, MD. Schedule an appointment as soon as possible for a visit.   Specialty: Internal Medicine Why: For hospital follow-up Contact information: [address] [place] [address] [phone]                Allergies  Allergen Reactions   Diazepam [name]   Gabapentin [name] and Swelling    Other reaction(s): Unknown   Iodinated Diagnostic Agents Anaphylaxis    11/29/17 Cardiac arrest 1 min after IV contrast, possible allergy vs vasovagal episode Iopamidol  Anaphylaxis  High 11/28/2017  Patient had seizure like activity and then code post 100 cc of isovue 300     Isovue [Iopamidol] Anaphylaxis    11/28/17 Patient had seizure like activity and then 1 min code after 100 cc of isovue 300. Possible contrast allergy vs vasovagal episode   Lisinopril Anaphylaxis    Tongue and mouth swelling   Metoclopramide Other (See Comments)    Tardive dyskinesia   Nsaids Anaphylaxis and Other (See Comments)    ULCER   Penicillins Palpitations    Has patient had a PCN reaction causing immediate rash, facial/tongue/throat swelling, SOB or lightheadedness with hypotension: Yes, heart races Has patient had a PCN reaction causing severe rash involving mucus membranes or skin necrosis: No Has patient had a PCN reaction that required  hospitalization: Yes  Has patient had a PCN reaction occurring within the last 10 years: No    Tolmetin Nausea Only and Other (See Comments)    ULCER   Aspirin Other (See Comments)    Irritates stomach ulcer    Cyclobenzaprine     Other reaction(s): Unknown   Rifamycins    Acetaminophen Nausea Only and Other (See Comments)    Irritates stomach ulcer; Abdominal pain   Oxycodone Palpitations   Tramadol Nausea And Vomiting    Consultations: General surgery (curbside)  Procedures/Studies: CT ABDOMEN PELVIS WO CONTRAST  Result Date: 11/07/2020 CLINICAL DATA:  Abdominal pain, acute, nonlocalized EXAM: CT ABDOMEN AND PELVIS WITHOUT CONTRAST TECHNIQUE: Multidetector CT imaging of the abdomen and pelvis was performed following the standard protocol without IV contrast. COMPARISON:  10/22/2020 FINDINGS: Lower chest: No acute abnormality. Hepatobiliary: Stable appearance.  Post cholecystectomy. Pancreas: Unremarkable. Spleen: Normal. Adrenals/Urinary Tract: Adrenals, kidneys, and bladder are unremarkable. Stomach/Bowel: Stomach is within normal limits. Bowel is normal in caliber. Vascular/Lymphatic: Minimal aortic atherosclerosis. No enlarged lymph nodes. Reproductive: A probable fibroid is again noted on the left. No adnexal mass. Other: No free fluid.  Abdominal wall is unremarkable. Musculoskeletal: Lower lumbar spine degenerative changes. IMPRESSION: No acute abnormality. No significant change since recent prior study. Electronically Signed   By: Macy Mis M.D.   On: 11/07/2020 11:50   CT Abdomen Pelvis Wo Contrast  Result Date: 10/22/2020 CLINICAL DATA:  Bowel obstruction suspected.  Abd pain, n/v, EXAM: CT ABDOMEN AND PELVIS WITHOUT CONTRAST TECHNIQUE: Multidetector CT imaging of the abdomen and pelvis was performed following the standard protocol without IV contrast. COMPARISON:  CT abdomen pelvis 09/14/2020. FINDINGS: Lower chest: No acute abnormality. Hepatobiliary: The liver is  enlarged measuring up to 21.5 Cm. No focal liver abnormality. Status post cholecystectomy. No biliary dilatation. Pancreas: No focal lesion. Normal pancreatic contour. No surrounding inflammatory changes. No main pancreatic ductal dilatation. Spleen: Normal in size without focal abnormality. Adrenals/Urinary Tract: No adrenal nodule bilaterally. Bilateral kidneys enhance symmetrically. No hydronephrosis. No hydroureter. The urinary bladder is unremarkable. Stomach/Bowel: Stomach is within normal limits. No evidence of bowel wall thickening or dilatation. Appendix appears normal. Vascular/Lymphatic: No abdominal aorta or iliac aneurysm. Mild atherosclerotic plaque of the aorta and its branches. No abdominal, pelvic, or inguinal lymphadenopathy. Reproductive: Uterus and bilateral adnexa are unremarkable. Other: No intraperitoneal free fluid. No intraperitoneal free gas. No organized fluid collection. Musculoskeletal: No abdominal wall hernia or abnormality. No suspicious lytic or blastic osseous lesions. No acute displaced fracture. Severe degenerative changes of the L5-S1 level. Grade 1 anterolisthesis of L4 on L5. IMPRESSION: 1. No acute intra-abdominal or intrapelvic abnormality with limited evaluation on this noncontrast study. 2. Hepatomegaly. 3.  Aortic Atherosclerosis (ICD10-I70.0). Electronically Signed   By: Iven Finn M.D.   On: 10/22/2020 18:09   DG Abd 1 View  Result Date: 11/06/2020 CLINICAL DATA:  Abdominal pain. EXAM: ABDOMEN - 1 VIEW COMPARISON:  Radiograph dated 10/07/2020 CT dated 10/22/2020 FINDINGS: No bowel dilatation or evidence of obstruction. No free air or radiopaque calculi. Right upper quadrant cholecystectomy clips. Degenerative changes of the spine. No acute osseous pathology. IMPRESSION: Negative. Electronically Signed   By: Anner Crete M.D.   On: 11/06/2020 23:38      Subjective: Abdominal pain. Patient reports frequent episodes of emesis.  She states that she has been  vomiting so much that she is unable to vomit into the blue emesis bags and has been vomiting into the garbage can.  She states that [facility] has removed the garbage can that contain her emesis and therefore is not present in the room.  Patient reports that she needs IV Dilaudid for her pain management and that she knows the treatment she needs.  Discharge Exam: Vitals:   11/08/20 0902 11/08/20 1017  BP:  (!) 154/95  Pulse: (!) 118 (!) 112  Resp:  18  Temp:  98.2 F (36.8 C)  SpO2:  100%   Vitals:   11/08/20 0628 11/08/20 0900 11/08/20 0902 11/08/20 1017  BP: [name])  132/105 136/84  (!) 154/95  Pulse: (!) 125 (!) 120 (!) 118 (!) 112  Resp: '20 18  18  ' Temp: 98.4 F (36.9 C) 98 F (36.7 C)  98.2 F (36.8 C)  TempSrc: Oral Axillary  Axillary  SpO2: 100% 100%  100%  Weight:      Height:        General: Pt is alert, awake Abdominal: Soft, ND, bowel sounds +, moderately tender    The results of significant diagnostics from this hospitalization (including imaging, microbiology, ancillary and laboratory) are listed below for reference.     Microbiology: Recent Results (from the past 240 hour(s))  SARS CORONAVIRUS 2 (TAT 6-24 HRS) Nasopharyngeal Nasopharyngeal Swab     Status: None   Collection Time: 11/06/20 10:59 PM   Specimen: Nasopharyngeal Swab  Result Value Ref Range Status   SARS Coronavirus 2 NEGATIVE NEGATIVE Final    Comment: (NOTE) SARS-CoV-2 target nucleic acids are NOT DETECTED.  The SARS-CoV-2 RNA is generally detectable in upper and lower respiratory specimens during the acute phase of infection. Negative results do not preclude SARS-CoV-2 infection, do not rule out co-infections with other pathogens, and should not be used as the sole basis for treatment or other patient management decisions. Negative results must be combined with clinical observations, patient history, and epidemiological information. The expected result is Negative.  Fact Sheet for  Patients: SugarRoll.be  Fact Sheet for Healthcare Providers: https://www.woods-mathews.com/  This test is not yet approved or cleared by the [place] FDA and  has been authorized for detection and/or diagnosis of SARS-CoV-2 by FDA under an Emergency Use Authorization (EUA). This EUA will remain  in effect (meaning this test can be used) for the duration of the COVID-19 declaration under Se ction 564(b)(1) of the Act, 21 U.S.C. section 360bbb-3(b)(1), unless the authorization is terminated or revoked sooner.  Performed at [facility], [place] [address], [place], [place] [address]      Labs: BNP (last 3 results) Recent Labs    01/23/20 1315 05/25/20 1102 08/28/20 0937  BNP 165.0* 51.2 49.4   Basic Metabolic Panel: Recent Labs  Lab 11/06/20 1633 11/06/20 1644 11/07/20 0822  NA 140 143 136  K 3.2* 3.2* 3.3*  CL 112*  --  109  CO2 17*  --  16*  GLUCOSE 101*  --  164*  BUN 22*  --  18  CREATININE 1.31*  --  1.16*  CALCIUM 9.7  --  8.6*   Liver Function Tests: Recent Labs  Lab 11/06/20 1633 11/07/20 0822  AST 20 14*  ALT 18 13  ALKPHOS 104 80  BILITOT 0.7 1.0  PROT 7.9 6.8  ALBUMIN 3.2* 3.0*   Recent Labs  Lab 11/06/20 1633  LIPASE 40   No results for input(s): AMMONIA in the last 168 hours. CBC: Recent Labs  Lab 11/06/20 1633 11/06/20 1644 11/07/20 0822  WBC 13.3*  --  12.2*  NEUTROABS 9.2*  --   --   HGB 10.5* 10.9* 9.5*  HCT 33.0* 32.0* 30.2*  MCV 88.7  --  90.1  PLT 344  --  307   Cardiac Enzymes: No results for input(s): CKTOTAL, CKMB, CKMBINDEX, TROPONINI in the last 168 hours. BNP: Invalid input(s): POCBNP CBG: Recent Labs  Lab 11/07/20 1807 11/07/20 2153 11/08/20 0244 11/08/20 0621 11/08/20 1140  GLUCAP 274* 251* 298* 304* 258*   D-Dimer No results for input(s): DDIMER in the last 72 hours. Hgb A1c No results for input(s): HGBA1C in  the last 72 hours. Lipid Profile No results  for input(s): CHOL, HDL, LDLCALC, TRIG, CHOLHDL, LDLDIRECT in the last 72 hours. Thyroid function studies No results for input(s): TSH, T4TOTAL, T3FREE, THYROIDAB in the last 72 hours.  Invalid input(s): FREET3 Anemia work up No results for input(s): VITAMINB12, FOLATE, FERRITIN, TIBC, IRON, RETICCTPCT in the last 72 hours. Urinalysis    Component Value Date/Time   COLORURINE YELLOW 10/22/2020 1641   APPEARANCEUR CLEAR 10/22/2020 1641   LABSPEC 1.010 10/22/2020 1641   PHURINE 6.0 10/22/2020 1641   GLUCOSEU 150 (A) 10/22/2020 1641   HGBUR NEGATIVE 10/22/2020 1641   BILIRUBINUR NEGATIVE 10/22/2020 1641   KETONESUR NEGATIVE 10/22/2020 1641   PROTEINUR 100 (A) 10/22/2020 1641   UROBILINOGEN 0.2 10/02/2013 2108   NITRITE NEGATIVE 10/22/2020 1641   LEUKOCYTESUR NEGATIVE 10/22/2020 1641   Sepsis Labs Invalid input(s): PROCALCITONIN,  WBC,  LACTICIDVEN Microbiology Recent Results (from the past 240 hour(s))  SARS CORONAVIRUS 2 (TAT 6-24 HRS) Nasopharyngeal Nasopharyngeal Swab     Status: None   Collection Time: 11/06/20 10:59 PM   Specimen: Nasopharyngeal Swab  Result Value Ref Range Status   SARS Coronavirus 2 NEGATIVE NEGATIVE Final    Comment: (NOTE) SARS-CoV-2 target nucleic acids are NOT DETECTED.  The SARS-CoV-2 RNA is generally detectable in upper and lower respiratory specimens during the acute phase of infection. Negative results do not preclude SARS-CoV-2 infection, do not rule out co-infections with other pathogens, and should not be used as the sole basis for treatment or other patient management decisions. Negative results must be combined with clinical observations, patient history, and epidemiological information. The expected result is Negative.  Fact Sheet for Patients: SugarRoll.be  Fact Sheet for Healthcare Providers: https://www.woods-mathews.com/  This test is not yet approved or cleared by the [place] FDA  and  has been authorized for detection and/or diagnosis of SARS-CoV-2 by FDA under an Emergency Use Authorization (EUA). This EUA will remain  in effect (meaning this test can be used) for the duration of the COVID-19 declaration under Se ction 564(b)(1) of the Act, 21 U.S.C. section 360bbb-3(b)(1), unless the authorization is terminated or revoked sooner.  Performed at [facility], [place] [address] [place], [place] [address]      Time coordinating discharge: 35 minutes, at least 50% of time was spent counseling patient on medical indication for IV narcotic presciption and trying to formulate a sound plan for investigation of etiology of her pain.  SIGNED:  Cordelia Poche, MD [facility] 11/09/2020, 2:46 PM

## 2020-11-11 LAB — TOXASSURE SELECT,+ANTIDEPR,UR

## 2020-11-12 ENCOUNTER — Telehealth: Payer: Self-pay | Admitting: *Deleted

## 2020-11-12 NOTE — Telephone Encounter (Signed)
Urine drug screen is positive for her hydromorphone. Of note though the urine was collected the next day after she received a new Rx. There was no record of if she had pills at visit or when she took it last.

## 2020-11-12 NOTE — Telephone Encounter (Signed)
error 

## 2020-11-20 ENCOUNTER — Inpatient Hospital Stay (HOSPITAL_COMMUNITY)
Admission: EM | Admit: 2020-11-20 | Discharge: 2020-11-24 | DRG: 074 | Disposition: A | Discharge status: Home / Self Care | Payer: Medicare Other | Attending: Hospitalist | Admitting: Hospitalist

## 2020-11-20 ENCOUNTER — Other Ambulatory Visit: Payer: Self-pay

## 2020-11-20 ENCOUNTER — Emergency Department (HOSPITAL_COMMUNITY): Payer: Medicare Other

## 2020-11-20 ENCOUNTER — Encounter (HOSPITAL_COMMUNITY): Payer: Self-pay

## 2020-11-20 DIAGNOSIS — R0789 Other chest pain: Secondary | ICD-10-CM | POA: Diagnosis present

## 2020-11-20 DIAGNOSIS — Z79899 Other long term (current) drug therapy: Secondary | ICD-10-CM

## 2020-11-20 DIAGNOSIS — I493 Ventricular premature depolarization: Secondary | ICD-10-CM | POA: Diagnosis present

## 2020-11-20 DIAGNOSIS — G894 Chronic pain syndrome: Secondary | ICD-10-CM | POA: Diagnosis present

## 2020-11-20 DIAGNOSIS — N184 Chronic kidney disease, stage 4 (severe): Secondary | ICD-10-CM | POA: Diagnosis present

## 2020-11-20 DIAGNOSIS — E1165 Type 2 diabetes mellitus with hyperglycemia: Secondary | ICD-10-CM | POA: Diagnosis present

## 2020-11-20 DIAGNOSIS — D649 Anemia, unspecified: Secondary | ICD-10-CM | POA: Diagnosis present

## 2020-11-20 DIAGNOSIS — I10 Essential (primary) hypertension: Secondary | ICD-10-CM | POA: Diagnosis present

## 2020-11-20 DIAGNOSIS — D72829 Elevated white blood cell count, unspecified: Secondary | ICD-10-CM | POA: Diagnosis present

## 2020-11-20 DIAGNOSIS — M797 Fibromyalgia: Secondary | ICD-10-CM | POA: Diagnosis present

## 2020-11-20 DIAGNOSIS — R1084 Generalized abdominal pain: Secondary | ICD-10-CM

## 2020-11-20 DIAGNOSIS — K219 Gastro-esophageal reflux disease without esophagitis: Secondary | ICD-10-CM | POA: Diagnosis present

## 2020-11-20 DIAGNOSIS — E876 Hypokalemia: Secondary | ICD-10-CM | POA: Diagnosis not present

## 2020-11-20 DIAGNOSIS — IMO0002 Reserved for concepts with insufficient information to code with codable children: Secondary | ICD-10-CM

## 2020-11-20 DIAGNOSIS — R112 Nausea with vomiting, unspecified: Secondary | ICD-10-CM | POA: Diagnosis present

## 2020-11-20 DIAGNOSIS — Z880 Allergy status to penicillin: Secondary | ICD-10-CM

## 2020-11-20 DIAGNOSIS — Z7901 Long term (current) use of anticoagulants: Secondary | ICD-10-CM

## 2020-11-20 DIAGNOSIS — Z8674 Personal history of sudden cardiac arrest: Secondary | ICD-10-CM

## 2020-11-20 DIAGNOSIS — E1122 Type 2 diabetes mellitus with diabetic chronic kidney disease: Secondary | ICD-10-CM | POA: Diagnosis present

## 2020-11-20 DIAGNOSIS — K760 Fatty (change of) liver, not elsewhere classified: Secondary | ICD-10-CM | POA: Diagnosis present

## 2020-11-20 DIAGNOSIS — R778 Other specified abnormalities of plasma proteins: Secondary | ICD-10-CM | POA: Diagnosis present

## 2020-11-20 DIAGNOSIS — E785 Hyperlipidemia, unspecified: Secondary | ICD-10-CM | POA: Diagnosis present

## 2020-11-20 DIAGNOSIS — R7989 Other specified abnormal findings of blood chemistry: Secondary | ICD-10-CM | POA: Diagnosis present

## 2020-11-20 DIAGNOSIS — N1832 Chronic kidney disease, stage 3b: Secondary | ICD-10-CM | POA: Diagnosis present

## 2020-11-20 DIAGNOSIS — R000 Tachycardia, unspecified: Secondary | ICD-10-CM

## 2020-11-20 DIAGNOSIS — Z881 Allergy status to other antibiotic agents status: Secondary | ICD-10-CM

## 2020-11-20 DIAGNOSIS — G8929 Other chronic pain: Secondary | ICD-10-CM

## 2020-11-20 DIAGNOSIS — R079 Chest pain, unspecified: Secondary | ICD-10-CM | POA: Diagnosis present

## 2020-11-20 DIAGNOSIS — M4807 Spinal stenosis, lumbosacral region: Secondary | ICD-10-CM | POA: Diagnosis present

## 2020-11-20 DIAGNOSIS — K5900 Constipation, unspecified: Secondary | ICD-10-CM

## 2020-11-20 DIAGNOSIS — Z8249 Family history of ischemic heart disease and other diseases of the circulatory system: Secondary | ICD-10-CM

## 2020-11-20 DIAGNOSIS — M109 Gout, unspecified: Secondary | ICD-10-CM | POA: Diagnosis present

## 2020-11-20 DIAGNOSIS — Z885 Allergy status to narcotic agent status: Secondary | ICD-10-CM

## 2020-11-20 DIAGNOSIS — I480 Paroxysmal atrial fibrillation: Secondary | ICD-10-CM | POA: Diagnosis present

## 2020-11-20 DIAGNOSIS — E872 Acidosis: Secondary | ICD-10-CM | POA: Diagnosis present

## 2020-11-20 DIAGNOSIS — R9431 Abnormal electrocardiogram [ECG] [EKG]: Secondary | ICD-10-CM | POA: Diagnosis present

## 2020-11-20 DIAGNOSIS — I428 Other cardiomyopathies: Secondary | ICD-10-CM | POA: Diagnosis present

## 2020-11-20 DIAGNOSIS — I5022 Chronic systolic (congestive) heart failure: Secondary | ICD-10-CM | POA: Diagnosis present

## 2020-11-20 DIAGNOSIS — I700 Atherosclerosis of aorta: Secondary | ICD-10-CM | POA: Diagnosis present

## 2020-11-20 DIAGNOSIS — I130 Hypertensive heart and chronic kidney disease with heart failure and stage 1 through stage 4 chronic kidney disease, or unspecified chronic kidney disease: Secondary | ICD-10-CM | POA: Diagnosis present

## 2020-11-20 DIAGNOSIS — D490 Neoplasm of unspecified behavior of digestive system: Secondary | ICD-10-CM | POA: Diagnosis present

## 2020-11-20 DIAGNOSIS — E1143 Type 2 diabetes mellitus with diabetic autonomic (poly)neuropathy: Principal | ICD-10-CM | POA: Diagnosis present

## 2020-11-20 DIAGNOSIS — E860 Dehydration: Secondary | ICD-10-CM | POA: Diagnosis present

## 2020-11-20 DIAGNOSIS — N179 Acute kidney failure, unspecified: Secondary | ICD-10-CM | POA: Diagnosis present

## 2020-11-20 DIAGNOSIS — Z886 Allergy status to analgesic agent status: Secondary | ICD-10-CM

## 2020-11-20 DIAGNOSIS — Z833 Family history of diabetes mellitus: Secondary | ICD-10-CM

## 2020-11-20 DIAGNOSIS — M5431 Sciatica, right side: Secondary | ICD-10-CM | POA: Diagnosis present

## 2020-11-20 DIAGNOSIS — Z765 Malingerer [conscious simulation]: Secondary | ICD-10-CM

## 2020-11-20 DIAGNOSIS — K7581 Nonalcoholic steatohepatitis (NASH): Secondary | ICD-10-CM | POA: Diagnosis present

## 2020-11-20 DIAGNOSIS — Z8673 Personal history of transient ischemic attack (TIA), and cerebral infarction without residual deficits: Secondary | ICD-10-CM

## 2020-11-20 DIAGNOSIS — E1140 Type 2 diabetes mellitus with diabetic neuropathy, unspecified: Secondary | ICD-10-CM

## 2020-11-20 DIAGNOSIS — K3184 Gastroparesis: Secondary | ICD-10-CM | POA: Diagnosis present

## 2020-11-20 DIAGNOSIS — Z888 Allergy status to other drugs, medicaments and biological substances status: Secondary | ICD-10-CM

## 2020-11-20 DIAGNOSIS — D631 Anemia in chronic kidney disease: Secondary | ICD-10-CM | POA: Diagnosis present

## 2020-11-20 DIAGNOSIS — R1110 Vomiting, unspecified: Secondary | ICD-10-CM | POA: Diagnosis not present

## 2020-11-20 DIAGNOSIS — Z20822 Contact with and (suspected) exposure to covid-19: Secondary | ICD-10-CM | POA: Diagnosis present

## 2020-11-20 DIAGNOSIS — C7A010 Malignant carcinoid tumor of the duodenum: Secondary | ICD-10-CM | POA: Diagnosis present

## 2020-11-20 DIAGNOSIS — Z6841 Body Mass Index (BMI) 40.0 and over, adult: Secondary | ICD-10-CM

## 2020-11-20 DIAGNOSIS — Z8711 Personal history of peptic ulcer disease: Secondary | ICD-10-CM

## 2020-11-20 DIAGNOSIS — R109 Unspecified abdominal pain: Secondary | ICD-10-CM | POA: Diagnosis not present

## 2020-11-20 DIAGNOSIS — Z794 Long term (current) use of insulin: Secondary | ICD-10-CM

## 2020-11-20 DIAGNOSIS — Z9049 Acquired absence of other specified parts of digestive tract: Secondary | ICD-10-CM

## 2020-11-20 DIAGNOSIS — Z993 Dependence on wheelchair: Secondary | ICD-10-CM

## 2020-11-20 DIAGNOSIS — D519 Vitamin B12 deficiency anemia, unspecified: Secondary | ICD-10-CM | POA: Diagnosis present

## 2020-11-20 DIAGNOSIS — Z79891 Long term (current) use of opiate analgesic: Secondary | ICD-10-CM

## 2020-11-20 DIAGNOSIS — M5432 Sciatica, left side: Secondary | ICD-10-CM | POA: Diagnosis present

## 2020-11-20 HISTORY — DX: Thrombocytosis, unspecified: D75.839

## 2020-11-20 HISTORY — DX: Chronic kidney disease, stage 3b: N18.32

## 2020-11-20 HISTORY — DX: Type 2 diabetes mellitus with ketoacidosis without coma: E11.10

## 2020-11-20 HISTORY — DX: Morbid (severe) obesity due to excess calories: E66.01

## 2020-11-20 HISTORY — DX: Malingerer (conscious simulation): Z76.5

## 2020-11-20 HISTORY — DX: Other specified abnormalities of plasma proteins: R77.8

## 2020-11-20 HISTORY — DX: Other specified abnormal findings of blood chemistry: R79.89

## 2020-11-20 LAB — URINALYSIS, ROUTINE W REFLEX MICROSCOPIC
Bilirubin Urine: NEGATIVE
Glucose, UA: NEGATIVE mg/dL
Ketones, ur: 5 mg/dL — AB
Leukocytes,Ua: NEGATIVE
Nitrite: NEGATIVE
Protein, ur: >=300 mg/dL — AB
Specific Gravity, Urine: 1.011 (ref 1.005–1.030)
pH: 5 (ref 5.0–8.0)

## 2020-11-20 LAB — COMPREHENSIVE METABOLIC PANEL
ALT: 14 U/L (ref 0–44)
AST: 20 U/L (ref 15–41)
Albumin: 3.7 g/dL (ref 3.5–5.0)
Alkaline Phosphatase: 102 U/L (ref 38–126)
Anion gap: 15 (ref 5–15)
BUN: 23 mg/dL — ABNORMAL HIGH (ref 6–20)
CO2: 19 mmol/L — ABNORMAL LOW (ref 22–32)
Calcium: 8.9 mg/dL (ref 8.9–10.3)
Chloride: 104 mmol/L (ref 98–111)
Creatinine, Ser: 2.14 mg/dL — ABNORMAL HIGH (ref 0.44–1.00)
GFR, Estimated: 27 mL/min — ABNORMAL LOW (ref >60)
Glucose, Bld: 103 mg/dL — ABNORMAL HIGH (ref 70–99)
Potassium: 2.4 mmol/L — CL (ref 3.5–5.1)
Sodium: 138 mmol/L (ref 135–145)
Total Bilirubin: 0.6 mg/dL (ref 0.3–1.2)
Total Protein: 8.6 g/dL — ABNORMAL HIGH (ref 6.5–8.1)

## 2020-11-20 LAB — TROPONIN I (HIGH SENSITIVITY)
Troponin I (High Sensitivity): 26 ng/L — ABNORMAL HIGH (ref <18)
Troponin I (High Sensitivity): 38 ng/L — ABNORMAL HIGH (ref <18)

## 2020-11-20 LAB — CBC
HCT: 31.1 % — ABNORMAL LOW (ref 36.0–46.0)
Hemoglobin: 9.8 g/dL — ABNORMAL LOW (ref 12.0–15.0)
MCH: 27.8 pg (ref 26.0–34.0)
MCHC: 31.5 g/dL (ref 30.0–36.0)
MCV: 88.4 fL (ref 80.0–100.0)
Platelets: 459 10*3/uL — ABNORMAL HIGH (ref 150–400)
RBC: 3.52 MIL/uL — ABNORMAL LOW (ref 3.87–5.11)
RDW: 16.4 % — ABNORMAL HIGH (ref 11.5–15.5)
WBC: 16 10*3/uL — ABNORMAL HIGH (ref 4.0–10.5)
nRBC: 0 % (ref 0.0–0.2)

## 2020-11-20 LAB — I-STAT BETA HCG BLOOD, ED (MC, WL, AP ONLY): I-stat hCG, quantitative: <5 m[IU]/mL (ref <5)

## 2020-11-20 LAB — RAPID URINE DRUG SCREEN, HOSP PERFORMED
Amphetamines: NOT DETECTED
Barbiturates: NOT DETECTED
Benzodiazepines: NOT DETECTED
Cocaine: NOT DETECTED
Opiates: POSITIVE — AB
Tetrahydrocannabinol: NOT DETECTED

## 2020-11-20 LAB — LACTIC ACID, PLASMA
Lactic Acid, Venous: 2.4 mmol/L (ref 0.5–1.9)
Lactic Acid, Venous: 1.8 mmol/L (ref 0.5–1.9)

## 2020-11-20 LAB — MAGNESIUM: Magnesium: 1.1 mg/dL — ABNORMAL LOW (ref 1.7–2.4)

## 2020-11-20 LAB — LIPASE, BLOOD: Lipase: 22 U/L (ref 11–51)

## 2020-11-20 LAB — RESP PANEL BY RT-PCR (FLU A&B, COVID) ARPGX2
Influenza A by PCR: NEGATIVE
Influenza B by PCR: NEGATIVE
SARS Coronavirus 2 by RT PCR: NEGATIVE

## 2020-11-20 MED ORDER — INSULIN ASPART 100 UNIT/ML IJ SOLN
0.0000 [IU] | INTRAMUSCULAR | Status: DC
  Administered 2020-11-21 (×2): 2 [IU] via SUBCUTANEOUS
  Administered 2020-11-21: 5 [IU] via SUBCUTANEOUS
  Administered 2020-11-21: 9 [IU] via SUBCUTANEOUS
  Administered 2020-11-21: 2 [IU] via SUBCUTANEOUS
  Filled 2020-11-20: qty 0.09

## 2020-11-20 MED ORDER — HYDROMORPHONE HCL 2 MG/ML IJ SOLN
2.0000 mg | Freq: Once | INTRAMUSCULAR | Status: DC
Start: 2020-11-20 — End: 2020-11-20

## 2020-11-20 MED ORDER — MAGNESIUM SULFATE 50 % IJ SOLN: 1.0000 g | Freq: Once | INTRAMUSCULAR | Status: DC

## 2020-11-20 MED ORDER — HYDROMORPHONE HCL 1 MG/ML IJ SOLN
0.5000 mg | Freq: Once | INTRAMUSCULAR | Status: AC
  Administered 2020-11-20: 0.5 mg via INTRAVENOUS
  Filled 2020-11-20: qty 1

## 2020-11-20 MED ORDER — ISOSORBIDE MONONITRATE ER 60 MG PO TB24
60.0000 mg | ORAL_TABLET | Freq: Every day | ORAL | Status: DC
  Administered 2020-11-21 – 2020-11-24 (×4): 60 mg via ORAL
  Filled 2020-11-20 (×5): qty 1

## 2020-11-20 MED ORDER — HYDROMORPHONE HCL 1 MG/ML IJ SOLN
1.0000 mg | Freq: Once | INTRAMUSCULAR | Status: AC
  Administered 2020-11-20: 1 mg via INTRAVENOUS
  Filled 2020-11-20: qty 1

## 2020-11-20 MED ORDER — HYDROMORPHONE HCL 1 MG/ML IJ SOLN
0.5000 mg | INTRAMUSCULAR | Status: DC | PRN
Start: 2020-11-20 — End: 2020-11-21
  Administered 2020-11-20 – 2020-11-21 (×4): 0.5 mg via INTRAVENOUS
  Filled 2020-11-20 (×2): qty 0.5
  Filled 2020-11-20: qty 1
  Filled 2020-11-20: qty 0.5

## 2020-11-20 MED ORDER — ATORVASTATIN CALCIUM 10 MG PO TABS
10.0000 mg | ORAL_TABLET | Freq: Every day | ORAL | Status: DC
  Administered 2020-11-21 – 2020-11-24 (×4): 10 mg via ORAL
  Filled 2020-11-20 (×5): qty 1

## 2020-11-20 MED ORDER — ALBUTEROL SULFATE (2.5 MG/3ML) 0.083% IN NEBU: 2.5000 mg | INHALATION_SOLUTION | RESPIRATORY_TRACT | Status: DC | PRN

## 2020-11-20 MED ORDER — APIXABAN 5 MG PO TABS
5.0000 mg | ORAL_TABLET | Freq: Two times a day (BID) | ORAL | Status: DC
  Administered 2020-11-21 – 2020-11-24 (×7): 5 mg via ORAL
  Filled 2020-11-20 (×8): qty 1

## 2020-11-20 MED ORDER — INSULIN GLARGINE-YFGN 100 UNIT/ML ~~LOC~~ SOLN
50.0000 [IU] | Freq: Every day | SUBCUTANEOUS | Status: DC
  Administered 2020-11-21 – 2020-11-23 (×4): 50 [IU] via SUBCUTANEOUS
  Filled 2020-11-20 (×4): qty 0.5

## 2020-11-20 MED ORDER — INSULIN GLARGINE 100 UNIT/ML SOLOSTAR PEN: 50.0000 [IU] | PEN_INJECTOR | Freq: Every day | SUBCUTANEOUS | Status: DC

## 2020-11-20 MED ORDER — POTASSIUM CHLORIDE CRYS ER 20 MEQ PO TBCR
40.0000 meq | EXTENDED_RELEASE_TABLET | Freq: Once | ORAL | Status: AC
  Administered 2020-11-20: 40 meq via ORAL
  Filled 2020-11-20: qty 2

## 2020-11-20 MED ORDER — MAGNESIUM SULFATE IN D5W 1-5 GM/100ML-% IV SOLN
1.0000 g | Freq: Once | INTRAVENOUS | Status: AC
  Administered 2020-11-20: 1 g via INTRAVENOUS
  Filled 2020-11-20: qty 100

## 2020-11-20 MED ORDER — SODIUM CHLORIDE 0.9 % IV SOLN
75.0000 mL/h | INTRAVENOUS | Status: AC
  Administered 2020-11-20: 75 mL/h via INTRAVENOUS

## 2020-11-20 MED ORDER — METOPROLOL SUCCINATE ER 25 MG PO TB24
25.0000 mg | ORAL_TABLET | Freq: Every day | ORAL | Status: DC
  Administered 2020-11-21 – 2020-11-23 (×4): 25 mg via ORAL
  Filled 2020-11-20 (×4): qty 1

## 2020-11-20 MED ORDER — POTASSIUM CHLORIDE 10 MEQ/100ML IV SOLN
10.0000 meq | INTRAVENOUS | Status: AC
  Administered 2020-11-20 (×4): 10 meq via INTRAVENOUS
  Filled 2020-11-20 (×4): qty 100

## 2020-11-20 MED ORDER — PANTOPRAZOLE SODIUM 40 MG PO TBEC
40.0000 mg | DELAYED_RELEASE_TABLET | Freq: Two times a day (BID) | ORAL | Status: DC
  Administered 2020-11-21 – 2020-11-24 (×7): 40 mg via ORAL
  Filled 2020-11-20 (×8): qty 1

## 2020-11-20 MED ORDER — ONDANSETRON HCL 4 MG/2ML IJ SOLN
4.0000 mg | Freq: Once | INTRAMUSCULAR | Status: AC
  Administered 2020-11-20: 4 mg via INTRAVENOUS
  Filled 2020-11-20: qty 2

## 2020-11-20 NOTE — ED Notes (Signed)
Unable to obtain imaging d/t pt writhing in bed.  Pt has refused x-ray x 2.

## 2020-11-20 NOTE — ED Provider Notes (Signed)
Care assumed from previous provider, see note for full HPI.  Plan to FU on CT AP and admit for hypokalemia  In summation 55 year old with extensive past medical history coming in for abdominal pain, emesis over the last few days.  Did develop some chest pain after multiple episodes of emesis.  Initial troponin similar to prior, no ischemic changes on EKG.  Episodes of NBNB emesis here in ED.  She has acute kidney injury.  Mildly elevated white count, normal hemoglobin.  Previous provider thought tachycardia likely due to demand, pain as patient tossing and turning, low suspicion for acute ACS, PE or dissection. Follow [facility] Physical Exam  BP (!) 171/106   Pulse (!) 124   Temp 97.6 F (36.4 C) (Oral)   Resp (!) 24   Ht 5\' 6"  (1.676 m)   Wt 127 kg   LMP 10/10/2012   SpO2 100%   BMI 45.19 kg/m   Physical Exam Vitals and nursing note reviewed.  Constitutional:      General: She is not in acute distress.    Appearance: She is well-developed. She is not ill-appearing, toxic-appearing or diaphoretic.  HENT:     Head: Normocephalic and atraumatic.  Eyes:     Pupils: Pupils are equal, round, and reactive to light.  Cardiovascular:     Rate and Rhythm: Normal rate.  Pulmonary:     Effort: No respiratory distress.  Abdominal:     General: Bowel sounds are normal. There is no distension.     Palpations: Abdomen is soft.     Tenderness: There is generalized abdominal tenderness.  Musculoskeletal:        General: Normal range of motion.     Cervical back: Normal range of motion.     Comments: Compartment soft, moves all 4 extremities on exam  Skin:    General: Skin is warm and dry.  Neurological:     General: No focal deficit present.     Mental Status: She is alert.  Psychiatric:        Mood and Affect: Mood normal.    ED Course/Procedures   Clinical Course as of 11/20/20 1843  Thu Nov 20, 2020  1532 Abd pain, AKI, intrac pain, hypokalemia. Plan to admit after CT AP [BH]     Clinical Course User Index [BH] Alette Kataoka A, PA-C    .Critical Care Performed by: Nettie Elm, PA-C Authorized by: Nettie Elm, PA-C   Critical care provider statement:    Critical care time (minutes):  35   Critical care time was exclusive of:  Separately billable procedures and treating other patients and teaching time   Critical care was necessary to treat or prevent imminent or life-threatening deterioration of the following conditions:  Dehydration and metabolic crisis   I assumed direction of critical care for this patient from another provider in my specialty: no     Care discussed with: admitting provider   Labs Reviewed  COMPREHENSIVE METABOLIC PANEL - Abnormal; Notable for the following components:      Result Value   Potassium 2.4 (*)    CO2 19 (*)    Glucose, Bld 103 (*)    BUN 23 (*)    Creatinine, Ser 2.14 (*)    Total Protein 8.6 (*)    GFR, Estimated 27 (*)    All other components within normal limits  CBC - Abnormal; Notable for the following components:   WBC 16.0 (*)    RBC 3.52 (*)  Hemoglobin 9.8 (*)    HCT 31.1 (*)    RDW 16.4 (*)    Platelets 459 (*)    All other components within normal limits  MAGNESIUM - Abnormal; Notable for the following components:   Magnesium 1.1 (*)    All other components within normal limits  LACTIC ACID, PLASMA - Abnormal; Notable for the following components:   Lactic Acid, Venous 2.4 (*)    All other components within normal limits  TROPONIN I (HIGH SENSITIVITY) - Abnormal; Notable for the following components:   Troponin I (High Sensitivity) 26 (*)    All other components within normal limits  TROPONIN I (HIGH SENSITIVITY) - Abnormal; Notable for the following components:   Troponin I (High Sensitivity) 38 (*)    All other components within normal limits  RESP PANEL BY RT-PCR (FLU A&B, COVID) ARPGX2  LIPASE, BLOOD  LACTIC ACID, PLASMA  URINALYSIS, ROUTINE W REFLEX MICROSCOPIC  RAPID URINE  DRUG SCREEN, HOSP PERFORMED  I-STAT BETA HCG BLOOD, ED ([facility], [facility], [facility] ONLY)  TROPONIN I (HIGH SENSITIVITY)     CT ABDOMEN PELVIS WO CONTRAST  Result Date: 11/20/2020 CLINICAL DATA:  Epigastric pain EXAM: CT ABDOMEN AND PELVIS WITHOUT CONTRAST TECHNIQUE: Multidetector CT imaging of the abdomen and pelvis was performed following the standard protocol without IV contrast. COMPARISON:  CT abdomen/pelvis 11/07/2020 FINDINGS: Lower chest: The lung bases are clear. The imaged heart is unremarkable. Hepatobiliary: The liver is unremarkable. The gallbladder is surgically absent. There is no biliary ductal dilatation. Pancreas: Unremarkable. Spleen: Unremarkable Adrenals/Urinary Tract: The adrenals are unremarkable. The kidneys are normal, with no focal lesion, stone, hydronephrosis, or hydroureter. The bladder is unremarkable. Stomach/Bowel: The stomach is unremarkable. There is no evidence of bowel obstruction. There is no abnormal bowel wall thickening or inflammatory change. The appendix is normal. Vascular/Lymphatic: The abdominal aorta is nonaneurysmal with minimal calcified atherosclerotic plaque. There is no abdominal or pelvic lymphadenopathy. Reproductive: There is a 4.9 cm soft tissue density lesion in the left adnexa, unchanged going back multiple studies. There is no new adnexal lesion. Other: There is no ascites or free air. Musculoskeletal: There are degenerative changes at L5-S1 with bulky anterior osteophytes, unchanged. IMPRESSION: No acute findings in the abdomen or pelvis. No change since 11/07/2020. Aortic Atherosclerosis (ICD10-I70.0). Electronically Signed   By: Valetta Mole M.D.   On: 11/20/2020 16:19   CT ABDOMEN PELVIS WO CONTRAST  Result Date: 11/07/2020 CLINICAL DATA:  Abdominal pain, acute, nonlocalized EXAM: CT ABDOMEN AND PELVIS WITHOUT CONTRAST TECHNIQUE: Multidetector CT imaging of the abdomen and pelvis was performed following the standard protocol without IV contrast. COMPARISON:   10/22/2020 FINDINGS: Lower chest: No acute abnormality. Hepatobiliary: Stable appearance.  Post cholecystectomy. Pancreas: Unremarkable. Spleen: Normal. Adrenals/Urinary Tract: Adrenals, kidneys, and bladder are unremarkable. Stomach/Bowel: Stomach is within normal limits. Bowel is normal in caliber. Vascular/Lymphatic: Minimal aortic atherosclerosis. No enlarged lymph nodes. Reproductive: A probable fibroid is again noted on the left. No adnexal mass. Other: No free fluid.  Abdominal wall is unremarkable. Musculoskeletal: Lower lumbar spine degenerative changes. IMPRESSION: No acute abnormality. No significant change since recent prior study. Electronically Signed   By: Macy Mis M.D.   On: 11/07/2020 11:50   CT Abdomen Pelvis Wo Contrast  Result Date: 10/22/2020 CLINICAL DATA:  Bowel obstruction suspected.  Abd pain, n/v, EXAM: CT ABDOMEN AND PELVIS WITHOUT CONTRAST TECHNIQUE: Multidetector CT imaging of the abdomen and pelvis was performed following the standard protocol without IV contrast. COMPARISON:  CT abdomen pelvis 09/14/2020.  FINDINGS: Lower chest: No acute abnormality. Hepatobiliary: The liver is enlarged measuring up to 21.5 Cm. No focal liver abnormality. Status post cholecystectomy. No biliary dilatation. Pancreas: No focal lesion. Normal pancreatic contour. No surrounding inflammatory changes. No main pancreatic ductal dilatation. Spleen: Normal in size without focal abnormality. Adrenals/Urinary Tract: No adrenal nodule bilaterally. Bilateral kidneys enhance symmetrically. No hydronephrosis. No hydroureter. The urinary bladder is unremarkable. Stomach/Bowel: Stomach is within normal limits. No evidence of bowel wall thickening or dilatation. Appendix appears normal. Vascular/Lymphatic: No abdominal aorta or iliac aneurysm. Mild atherosclerotic plaque of the aorta and its branches. No abdominal, pelvic, or inguinal lymphadenopathy. Reproductive: Uterus and bilateral adnexa are unremarkable.  Other: No intraperitoneal free fluid. No intraperitoneal free gas. No organized fluid collection. Musculoskeletal: No abdominal wall hernia or abnormality. No suspicious lytic or blastic osseous lesions. No acute displaced fracture. Severe degenerative changes of the L5-S1 level. Grade 1 anterolisthesis of L4 on L5. IMPRESSION: 1. No acute intra-abdominal or intrapelvic abnormality with limited evaluation on this noncontrast study. 2. Hepatomegaly. 3.  Aortic Atherosclerosis (ICD10-I70.0). Electronically Signed   By: Iven Finn M.D.   On: 10/22/2020 18:09   DG Abd 1 View  Result Date: 11/06/2020 CLINICAL DATA:  Abdominal pain. EXAM: ABDOMEN - 1 VIEW COMPARISON:  Radiograph dated 10/07/2020 CT dated 10/22/2020 FINDINGS: No bowel dilatation or evidence of obstruction. No free air or radiopaque calculi. Right upper quadrant cholecystectomy clips. Degenerative changes of the spine. No acute osseous pathology. IMPRESSION: Negative. Electronically Signed   By: Anner Crete M.D.   On: 11/06/2020 23:38    MDM  Plan on follow-up on CT imaging and admit for hypokalemia, intractable emesis.  Labs and imaging personally reviewed and interpreted:  CBC with leukocytosis at 16.0 CMP hypokalemia at 2.4, given IV and p.o. replacement Creatinine 2.14 Lipase 22 Lactic acid 2.4 CT AP WO contrast without acute abnormality Trop 26>>38  Reassessed.  She is still having abdominal pain.  Does have mild elevated lactic acid, cleared with IV fluids, suspect elevated due to dehydration, low suspicion for ischemic cause.  She denies any current chest pain or shortness of breath.  She still some tachycardia however she is tossing and turning in bed.  Heart rate does come down when patient calms.  Low suspicion for acute ACS, PE, dissection or sepsis  Patient be admitted for intractable nausea and vomiting, unclear etiology as well as severe hypokalemia.  CONSULT with Dr. Roel Cluck with [facility] agrees to evaluate patient  for admission for further work. Request 3rd trop.  The patient appears reasonably stabilized for admission considering the current resources, flow, and capabilities available in the ED at this time, and I doubt any other [facility] requiring further screening and/or treatment in the ED prior to admission.       Doni Bacha A, PA-C 11/20/20 1844    Godfrey Pick, MD 11/21/20 1526

## 2020-11-20 NOTE — ED Notes (Signed)
ED TO INPATIENT HANDOFF REPORT  Name/Age/Gender Deborah [name] 55 y.o. female  Code Status Code Status History     Date Active Date Inactive Code Status Order ID Comments User Context   11/07/2020 0024 11/08/2020 1924 Full Code [id number]  Rise Patience, MD ED   10/23/2020 0020 10/26/2020 2344 Full Code [id number]  Shela Leff, MD ED   10/07/2020 2129 10/13/2020 1740 Full Code [id number]  Vianne Bulls, MD ED   07/21/2020 0640 07/27/2020 2135 Full Code [id number]  Rise Patience, MD Inpatient   03/29/2020 1317 03/30/2020 2027 Full Code [id number]  Jonnie Finner, DO Inpatient   01/24/2020 0016 01/28/2020 1726 Full Code [id number]  Elwyn Reach, MD Inpatient   01/08/2020 1023 01/14/2020 2135 Full Code [id number]  Matilde Haymaker, MD ED   11/26/2019 1549 11/30/2019 1800 Full Code [id number]  Harold Hedge, MD ED   08/19/2019 1909 08/31/2019 1942 Full Code [id number]  Mansy, Arvella Merles, MD ED   07/22/2019 1258 08/09/2019 1529 Full Code [id number]  Allie Bossier, MD ED   07/22/2019 0732 07/22/2019 1258 Full Code [id number]  Clance Boll, MD ED   07/10/2019 0342 07/19/2019 1752 Full Code [id number]  Shela Leff, MD ED   06/29/2019 1202 07/04/2019 2134 Full Code [id number]  Aline August, MD ED   06/22/2019 2330 06/26/2019 1931 Full Code [id number]  Etta Quill, DO ED   06/10/2019 0556 06/14/2019 2006 Full Code [id number]  Chauncey Mann, MD Inpatient   05/07/2019 2202 05/09/2019 1928 Full Code [id number]  [name], DO ED   03/12/2019 1445 03/17/2019 2212 Full Code [id number]  Lurline Del, DO ED   02/20/2019 1654 02/23/2019 1952 Full Code [id number]  Lattie Haw, MD ED   01/26/2019 1329 01/29/2019 1547 Full Code [id number]  Lady Deutscher, MD ED   12/17/2018 2152 12/19/2018 1750 Full Code [id number]  Shela Leff, MD ED   12/15/2018 1607 12/17/2018 0056 Full Code [id number]  Matilde Haymaker, MD ED   07/17/2018 0631 07/20/2018 1811 Full Code [id number]  Shela Leff, MD ED   11/28/2017  2341 12/02/2017 1835 Full Code [id number]  Norval Morton, MD ED   07/29/2017 1655 08/02/2017 2221 Full Code [id number]  Rondel Jumbo, PA-C ED   03/19/2017 2138 03/22/2017 1951 Full Code [id number]  Ivor Costa, MD ED   08/16/2016 0504 08/23/2016 1512 Full Code [id number]  Reubin Milan, MD ED   07/04/2016 2225 07/06/2016 1839 Full Code [id number]  Edwin Dada, MD Inpatient   06/17/2016 2245 06/19/2016 1815 Full Code [id number]  Vianne Bulls, MD ED   06/05/2016 1725 06/08/2016 1845 Full Code [id number]  Reubin Milan, MD Inpatient   06/05/2016 1710 06/05/2016 1725 Full Code [id number]  Reubin Milan, MD Inpatient   04/23/2016 2315 04/29/2016 2026 Full Code [id number]  Edwin Dada, MD Inpatient   02/29/2016 0311 03/02/2016 2005 Full Code [id number]  Toy Baker, MD ED   12/19/2015 0630 12/20/2015 2203 Full Code [id number]  Reubin Milan, MD ED   12/19/2015 0630 12/19/2015 0630 Full Code [id number]  Reubin Milan, MD ED   12/06/2015 2132 12/10/2015 1556 Full Code [id number]  Lily Kocher, MD Inpatient   09/26/2015 1639 09/28/2015 1502 Full Code [id number]  Verlee Monte, MD Inpatient   09/21/2015 1444 09/23/2015 2132 Full Code [id number]  Florencia Reasons, MD Inpatient   08/20/2015 1556 08/24/2015 2108 Full Code [id number]  Bonnell Public, MD ED   05/25/2015 2141 05/31/2015 1501 Full Code [id number]  Robbie Lis, MD Inpatient      Questions for Most Recent Historical Code Status (Order [id number])        Home/SNF/Other Home  Chief Complaint Hypokalemia [E87.6]  Level of Care/Admitting Diagnosis ED Disposition     ED Disposition  Admit   Condition  --   Comment  Hospital Area: [facility] [[id number]]  Level of Care: Telemetry [5]  Admit to tele based on following criteria: Monitor QTC interval  May place patient in observation at [facility] or [place] if equivalent level of care is available:: No  Covid Evaluation: Asymptomatic  Screening Protocol (No Symptoms)  Diagnosis: Hypokalemia [195093]  Admitting Physician: Toy Baker [[id number]]  Attending Physician: Toy Baker [[id number]]          Medical History Past Medical History:  Diagnosis Date   Acute back pain with sciatica, left    Acute back pain with sciatica, right    AKI (acute kidney injury) ([place])    Anemia, unspecified    Cancer ([place])    Carcinoid tumor of duodenum    Chronic pain    Chronic systolic CHF (congestive heart failure) ([place])    Diabetes mellitus    Esophageal reflux    Fibromyalgia    Gastric ulcer    Gastroparesis    Gout    Hyperlipidemia    Hypertension    Lumbosacral stenosis    NICM (nonischemic cardiomyopathy) (H[facility])    Obesity    PAF (paroxysmal atrial fibrillation) ([place])    Stroke ([place]) 02/2011   Vitamin B12 deficiency anemia     Allergies Allergies  Allergen Reactions   Diazepam [name]   Gabapentin [name] and Swelling    Other reaction(s): Unknown   Iodinated Diagnostic Agents Anaphylaxis    11/29/17 Cardiac arrest 1 min after IV contrast, possible allergy vs vasovagal episode Iopamidol  Anaphylaxis  High 11/28/2017  Patient had seizure like activity and then code post 100 cc of isovue 300     Isovue [Iopamidol] Anaphylaxis    11/28/17 Patient had seizure like activity and then 1 min code after 100 cc of isovue 300. Possible contrast allergy vs vasovagal episode   Lisinopril Anaphylaxis    Tongue and mouth swelling   Metoclopramide Other (See Comments)    Tardive dyskinesia   Nsaids Anaphylaxis and Other (See Comments)    ULCER   Penicillins Palpitations    Has patient had a PCN reaction causing immediate rash, facial/tongue/throat swelling, SOB or lightheadedness with hypotension: Yes, heart races Has patient had a PCN reaction causing severe rash involving mucus membranes or skin necrosis: No Has patient had a PCN reaction that required hospitalization: Yes  Has patient  had a PCN reaction occurring within the last 10 years: No    Tolmetin Nausea Only and Other (See Comments)    ULCER   Aspirin Other (See Comments)    Irritates stomach ulcer    Cyclobenzaprine     Other reaction(s): Unknown   Rifamycins    Acetaminophen Nausea Only and Other (See Comments)    Irritates stomach ulcer; Abdominal pain   Oxycodone Palpitations   Tramadol Nausea And Vomiting    IV Location/Drains/Wounds Patient Lines/Drains/Airways Status     Active Line/Drains/Airways     Name Placement date Placement time Site Days   Peripheral IV 11/20/20 20 G 1" Right Antecubital 11/20/20  1245  Antecubital  less than 1  Labs/Imaging Results for orders placed or performed during the hospital encounter of 11/20/20 (from the past 48 hour(s))  Lipase, blood     Status: None   Collection Time: 11/20/20 12:33 PM  Result Value Ref Range   Lipase 22 11 - 51 U/L    Comment: Performed at [facility], [place] [address] [place], [place] [address]  Comprehensive metabolic panel     Status: Abnormal   Collection Time: 11/20/20 12:33 PM  Result Value Ref Range   Sodium 138 135 - 145 mmol/L   Potassium 2.4 (LL) 3.5 - 5.1 mmol/L    Comment: CRITICAL RESULT CALLED TO, READ BACK BY AND VERIFIED WITH: HODGIN,K. RN AT 1346 11/20/20 MULLINS,T    Chloride 104 98 - 111 mmol/L   CO2 19 (L) 22 - 32 mmol/L   Glucose, Bld 103 (H) 70 - 99 mg/dL    Comment: Glucose reference range applies only to samples taken after fasting for at least 8 hours.   BUN 23 (H) 6 - 20 mg/dL   Creatinine, Ser 2.14 (H) 0.44 - 1.00 mg/dL   Calcium 8.9 8.9 - 10.3 mg/dL   Total Protein 8.6 (H) 6.5 - 8.1 g/dL   Albumin 3.7 3.5 - 5.0 g/dL   AST 20 15 - 41 U/L   ALT 14 0 - 44 U/L   Alkaline Phosphatase 102 38 - 126 U/L   Total Bilirubin 0.6 0.3 - 1.2 mg/dL   GFR, Estimated 27 (L) >60 mL/min    Comment: (NOTE) Calculated using the CKD-EPI Creatinine Equation (2021)    Anion gap 15 5 -  15    Comment: Performed at [facility], [place] [address] [place], [place] [address]  CBC     Status: Abnormal   Collection Time: 11/20/20 12:33 PM  Result Value Ref Range   WBC 16.0 (H) 4.0 - 10.5 K/uL   RBC 3.52 (L) 3.87 - 5.11 MIL/uL   Hemoglobin 9.8 (L) 12.0 - 15.0 g/dL   HCT 31.1 (L) 36.0 - 46.0 %   MCV 88.4 80.0 - 100.0 fL   MCH 27.8 26.0 - 34.0 pg   MCHC 31.5 30.0 - 36.0 g/dL   RDW 16.4 (H) 11.5 - 15.5 %   Platelets 459 (H) 150 - 400 K/uL   nRBC 0.0 0.0 - 0.2 %    Comment: Performed at [facility], [place] [address] [place], [place] [address]  Magnesium     Status: Abnormal   Collection Time: 11/20/20 12:33 PM  Result Value Ref Range   Magnesium 1.1 (L) 1.7 - 2.4 mg/dL    Comment: Performed at [facility], [place] [address] [place], [place] [address]  I-Stat beta hCG blood, ED     Status: None   Collection Time: 11/20/20 12:39 PM  Result Value Ref Range   I-stat hCG, quantitative <5.0 <5 mIU/mL   Comment 3            Comment:   GEST. AGE      CONC.  (mIU/mL)   <=1 WEEK        5 - 50     2 WEEKS       50 - 500     3 WEEKS       100 - 10,000     4 WEEKS     1,000 - 30,000        FEMALE AND NON-PREGNANT FEMALE:     LESS THAN 5  mIU/mL   Troponin I (High Sensitivity)     Status: Abnormal   Collection Time: 11/20/20  1:04 PM  Result Value Ref Range   Troponin I (High Sensitivity) 26 (H) <18 ng/L    Comment: (NOTE) Elevated high sensitivity troponin I (hsTnI) values and significant  changes across serial measurements may suggest ACS but many other  chronic and acute conditions are known to elevate hsTnI results.  Refer to the "Links" section for chest pain algorithms and additional  guidance. Performed at [facility], [place] [address], [place], [place] [address]   Lactic acid, plasma     Status: Abnormal   Collection Time: 11/20/20  2:11 PM  Result Value Ref Range   Lactic Acid, Venous 2.4 (HH)  0.5 - 1.9 mmol/L    Comment: CRITICAL RESULT CALLED TO, READ BACK BY AND VERIFIED WITH: WOODY,A. RN AT 1511 11/20/20 MULLINS,T Performed at [facility], [place] [address], [place], [place] [address]   Lactic acid, plasma     Status: None   Collection Time: 11/20/20  4:52 PM  Result Value Ref Range   Lactic Acid, Venous 1.8 0.5 - 1.9 mmol/L    Comment: Performed at [facility], [place] [address], [place], [place] [address]  Troponin I (High Sensitivity)     Status: Abnormal   Collection Time: 11/20/20  4:52 PM  Result Value Ref Range   Troponin I (High Sensitivity) 38 (H) <18 ng/L    Comment: (NOTE) Elevated high sensitivity troponin I (hsTnI) values and significant  changes across serial measurements may suggest ACS but many other  chronic and acute conditions are known to elevate hsTnI results.  Refer to the "Links" section for chest pain algorithms and additional  guidance. Performed at [facility], [place] [address], [place], [place] [address]    CT ABDOMEN PELVIS WO CONTRAST  Result Date: 11/20/2020 CLINICAL DATA:  Epigastric pain EXAM: CT ABDOMEN AND PELVIS WITHOUT CONTRAST TECHNIQUE: Multidetector CT imaging of the abdomen and pelvis was performed following the standard protocol without IV contrast. COMPARISON:  CT abdomen/pelvis 11/07/2020 FINDINGS: Lower chest: The lung bases are clear. The imaged heart is unremarkable. Hepatobiliary: The liver is unremarkable. The gallbladder is surgically absent. There is no biliary ductal dilatation. Pancreas: Unremarkable. Spleen: Unremarkable Adrenals/Urinary Tract: The adrenals are unremarkable. The kidneys are normal, with no focal lesion, stone, hydronephrosis, or hydroureter. The bladder is unremarkable. Stomach/Bowel: The stomach is unremarkable. There is no evidence of bowel obstruction. There is no abnormal bowel wall thickening or inflammatory change. The appendix is normal.  Vascular/Lymphatic: The abdominal aorta is nonaneurysmal with minimal calcified atherosclerotic plaque. There is no abdominal or pelvic lymphadenopathy. Reproductive: There is a 4.9 cm soft tissue density lesion in the left adnexa, unchanged going back multiple studies. There is no new adnexal lesion. Other: There is no ascites or free air. Musculoskeletal: There are degenerative changes at L5-S1 with bulky anterior osteophytes, unchanged. IMPRESSION: No acute findings in the abdomen or pelvis. No change since 11/07/2020. Aortic Atherosclerosis (ICD10-I70.0). Electronically Signed   By: Valetta Mole M.D.   On: 11/20/2020 16:19    Pending Labs Unresulted Labs (From admission, onward)     Start     Ordered   11/20/20 1834  Rapid urine drug screen (hospital performed)  ONCE - STAT,   STAT        11/20/20 1833   11/20/20 1832  Resp Panel by RT-PCR (Flu A&B, Covid) Nasopharyngeal Swab  (  Tier 2 - Symptomatic/asymptomatic with Precautions )  Once,   STAT       Question Answer Comment  Is this test for diagnosis or screening Diagnosis of ill patient   Symptomatic for COVID-19 as defined by CDC Yes   Date of Symptom Onset 11/19/2020   Hospitalized for COVID-19 Yes   Admitted to ICU for COVID-19 No   Previously tested for COVID-19 Yes   Resident in a congregate (group) care setting Unknown   Employed in healthcare setting Unknown   Pregnant No   Has patient completed COVID vaccination(s) (2 doses of Pfizer/Moderna 1 dose of [employer]) Unknown      11/20/20 1831   11/20/20 1233  Urinalysis, Routine w reflex microscopic Urine, Clean Catch  Once,   STAT        11/20/20 1233            Vitals/Pain Today's Vitals   11/20/20 1630 11/20/20 1757 11/20/20 1806 11/20/20 1920  BP: (!) 171/106     Pulse: (!) 124     Resp: (!) 24     Temp:      TempSrc:      SpO2: 100%     Weight:      Height:      PainSc:  10-Worst pain ever 10-Worst pain ever 0-No pain    Isolation  Precautions Airborne and Contact precautions  Medications Medications  magnesium sulfate IVPB 1 g 100 mL (has no administration in time range)  HYDROmorphone (DILAUDID) injection 1 mg (1 mg Intravenous Given 11/20/20 1312)  potassium chloride SA (KLOR-CON) CR tablet 40 mEq (40 mEq Oral Given 11/20/20 1411)  potassium chloride 10 mEq in 100 mL IVPB (10 mEq Intravenous New Bag/Given 11/20/20 1804)  HYDROmorphone (DILAUDID) injection 1 mg (1 mg Intravenous Given 11/20/20 1517)  ondansetron (ZOFRAN) injection 4 mg (4 mg Intravenous Given 11/20/20 1644)  HYDROmorphone (DILAUDID) injection 0.5 mg (0.5 mg Intravenous Given 11/20/20 1806)    Mobility walks

## 2020-11-20 NOTE — ED Provider Notes (Signed)
[facility] DEPT Provider Note   [name]: [id number] Arrival date & time: 11/20/20  1206     History Chief Complaint  Patient presents with   Abdominal Pain    Deborah [name] is a 55 y.o. female sent to the emergency department in acute distress.  Patient reports that she has had severe abdominal pain for the last 3 days, with nausea and vomiting, lack of appetite.  Patient reports that she has had chest pain for the last hour to two hours on arrival.  Patient has medical history significant for hypertension, diabetes, congestive heart failure, gastroparesis, previous cardiac arrest, previous carcinoid tumor, previous prolonged QT.  Patient denies diarrhea, constipation, dysuria at this time.  Patient is yelling, writhing in pain during initial interview, difficulty in obtaining detailed history.   Abdominal Pain Associated symptoms: chest pain, nausea and vomiting   Associated symptoms: no shortness of breath       Past Medical History:  Diagnosis Date   Acute back pain with sciatica, left    Acute back pain with sciatica, right    AKI (acute kidney injury) ([place])    Anemia, unspecified    Cancer ([place])    Carcinoid tumor of duodenum    Chronic pain    Chronic systolic CHF (congestive heart failure) (HCC)    Diabetes mellitus    Esophageal reflux    Fibromyalgia    Gastric ulcer    Gastroparesis    Gout    Hyperlipidemia    Hypertension    Lumbosacral stenosis    NICM (nonischemic cardiomyopathy) (HCC)    Obesity    PAF (paroxysmal atrial fibrillation) ([place])    Stroke ([place]) 02/2011   Vitamin B12 deficiency anemia     Patient Active Problem List   Diagnosis Date Noted   Nausea and vomiting 11/07/2020   Nausea & vomiting 11/06/2020   PAF (paroxysmal atrial fibrillation) (HCC)    Prolonged QT interval    Carcinoid tumor of duodenum    Non-alcoholic cirrhosis ([place])    Thrombocytosis    Acute renal failure superimposed on stage 3b  chronic kidney disease ([place])    ARF (acute renal failure) ([place]) 07/21/2020   Vaginal yeast infection 01/04/2020   AKI (acute kidney injury) ([place]) 11/27/2019   Chronic pain 09/04/2019   Malignant carcinoid tumor of duodenum ([place])    Nonalcoholic fatty liver disease 06/05/2019   Chronic diarrhea    COPD exacerbation ([place]) 05/08/2019   Acute on chronic HFrEF (heart failure with reduced ejection fraction) ([place])    Restrictive lung disease secondary to obesity    History of gastric ulcer    Uncontrolled type 2 diabetes mellitus with hyperglycemia ([place])    Fibroid uterus 11/57/2620   Chronic systolic CHF (congestive heart failure) ([place]) 12/19/2018   Intractable nausea and vomiting 12/17/2018   Hypoxia 12/17/2018   Abdominal pain 07/17/2018   Cardiac arrest ([place]) 11/29/2017   Leukocytosis 11/29/2017   Anxiety 11/29/2017   Stroke (cerebrum) ([place]) 03/19/2017   GERD (gastroesophageal reflux disease) 03/19/2017   Depression 03/19/2017   Morbid obesity ([place])    Normocytic anemia 08/16/2016   Diabetic gastroparesis ([place]) 08/16/2016   Gout 06/05/2016   Chest pain 09/26/2015   Hypokalemia 09/26/2015   Hypomagnesemia 09/26/2015   Uncontrolled type 2 diabetes mellitus with diabetic neuropathy, with long-term current use of insulin ([place]) 05/25/2015   Type 2 diabetes mellitus with hyperglycemia, with long-term current use of insulin ([place]) 05/25/2015   Chronic kidney disease, stage 3b (  [place]) 05/25/2015   Essential hypertension, benign 09/28/2013    Past Surgical History:  Procedure Laterality Date   BIOPSY  07/27/2019   Procedure: BIOPSY;  Surgeon: Clarene Essex, MD;  Location: [facility];  Service: Endoscopy;;   BIOPSY  07/30/2019   Procedure: BIOPSY;  Surgeon: Otis Brace, MD;  Location: [facility];  Service: Gastroenterology;;   CATARACT EXTRACTION  01/2014   CHOLECYSTECTOMY     COLONOSCOPY WITH PROPOFOL N/A 07/30/2019   Procedure: COLONOSCOPY WITH PROPOFOL;  Surgeon: Otis Brace, MD;  Location: [facility];  Service: Gastroenterology;  Laterality: N/A;   ESOPHAGOGASTRODUODENOSCOPY N/A 07/27/2019   Procedure: ESOPHAGOGASTRODUODENOSCOPY (EGD);  Surgeon: Clarene Essex, MD;  Location: [facility];  Service: Endoscopy;  Laterality: N/A;   ESOPHAGOGASTRODUODENOSCOPY N/A 07/26/2020   Procedure: ESOPHAGOGASTRODUODENOSCOPY (EGD);  Surgeon: Arta Silence, MD;  Location: [facility];  Service: Endoscopy;  Laterality: N/A;   ESOPHAGOGASTRODUODENOSCOPY (EGD) WITH PROPOFOL N/A 08/02/2019   Procedure: ESOPHAGOGASTRODUODENOSCOPY (EGD) WITH PROPOFOL;  Surgeon: Otis Brace, MD;  Location: [facility];  Service: Gastroenterology;  Laterality: N/A;   HEMOSTASIS CLIP PLACEMENT  08/02/2019   Procedure: HEMOSTASIS CLIP PLACEMENT;  Surgeon: Otis Brace, MD;  Location: [facility];  Service: Gastroenterology;;   POLYPECTOMY  07/30/2019   Procedure: POLYPECTOMY;  Surgeon: Otis Brace, MD;  Location: [facility];  Service: Gastroenterology;;   POLYPECTOMY  08/02/2019   Procedure: POLYPECTOMY;  Surgeon: Otis Brace, MD;  Location: [facility];  Service: Gastroenterology;;     OB History     Gravida  0   Para  0   Term  0   Preterm  0   AB  0   Living  0      SAB  0   IAB  0   Ectopic  0   Multiple  0   Live Births  0           Family History  Problem Relation Age of Onset   Diabetes Mother    Diabetes Father    Heart disease Father    Diabetes Sister    Congestive Heart Failure Sister 53   Diabetes Brother     Social History   Tobacco Use   Smoking status: Never   Smokeless tobacco: Never  Vaping Use   Vaping Use: Never used  Substance Use Topics   Alcohol use: No   Drug use: No    Home Medications Prior to Admission medications   Medication Sig Start Date End Date Taking? Authorizing Provider  albuterol (PROVENTIL) (2.5 MG/3ML) 0.083% nebulizer solution Take 3 mLs (2.5 mg total) by nebulization every 6 (six) hours as  needed for wheezing or shortness of breath. 04/06/19   Scot Jun, FNP  atorvastatin (LIPITOR) 10 MG tablet TAKE ONE TABLET BY MOUTH ONCE DAILY (AM) Patient taking differently: Take 10 mg by mouth daily. 10/22/20   Alcus Dad, MD  blood glucose meter kit and supplies KIT Dispense based on patient and insurance preference. Use up to four times daily as directed. (FOR ICD-9 250.00, 250.01). 12/13/18   [name], MD  cetirizine (ZYRTEC) 10 MG tablet Take 10 mg by mouth daily. 10/21/20   [provider]  DULoxetine HCl 40 MG CPEP TAKE ONE CAPSULE BY MOUTH TWICE DAILY Patient taking differently: Take 40 mg by mouth in the morning and at bedtime. 10/22/20   Alcus Dad, MD  EASY COMFORT PEN NEEDLES 31G X 5 MM MISC USE 3 TIMES A DAY FOR INSULIN ADMINISTRATION 11/14/19   Meccariello, Bernita Raisin,  DO  ELIQUIS 5 MG TABS tablet TAKE 1 TABLET BY MOUTH 2 (TWO) TIMES DAILY. Patient taking differently: Take 5 mg by mouth 2 (two) times daily. 12/20/19   Meccariello, Bernita Raisin, DO  fluticasone (FLONASE) 50 MCG/ACT nasal spray Place 2 sprays into both nostrils daily as needed for allergies or rhinitis. 12/19/18   Rai, Vernelle Emerald, MD  hydrALAZINE (APRESOLINE) 50 MG tablet Take 1 tablet (50 mg total) by mouth 3 (three) times daily. 09/10/20   Alcus Dad, MD  HYDROmorphone (DILAUDID) 4 MG tablet Take 1 tablet (4 mg total) by mouth 2 (two) times daily as needed for severe pain. 11/04/20   Raulkar, Clide Deutscher, MD  isosorbide mononitrate (IMDUR) 60 MG 24 hr tablet TAKE 1 TABLET (60 MG TOTAL) BY MOUTH DAILY. 09/10/20   Alcus Dad, MD  LANTUS SOLOSTAR 100 UNIT/ML Solostar Pen Inject 40 Units into the skin daily. Patient taking differently: Inject 50 Units into the skin at bedtime. 10/13/20 11/14/20  Dwyane Dee, MD  melatonin 3 MG TABS tablet TAKE 2 TABLETS (6 MG TOTAL) BY MOUTH AT BEDTIME. 08/11/20   Meccariello, Bernita Raisin, DO  metoprolol succinate (TOPROL-XL) 25 MG 24 hr tablet Take 25 mg by mouth  daily. 09/05/20   [provider]  nitroGLYCERIN (NITROSTAT) 0.4 MG SL tablet Place 1 tablet (0.4 mg total) under the tongue every 5 (five) minutes as needed for chest pain. 12/13/18   [name], MD  NOVOLOG FLEXPEN 100 UNIT/ML FlexPen Inject 1-15 Units into the skin in the morning, at noon, in the evening, and at bedtime. CBG 121 - 150: 2 units, CBG 151 - 200: 3 units, CBG 201 - 250: 5 units, CBG 251 - 300: 8 units, CBG 301 - 350: 11 units, CBG 351 - 400: 15 units, CBG > 400 call MD Patient taking differently: Inject 30 Units into the skin in the morning, at noon, in the evening, and at bedtime. 10/13/20   Dwyane Dee, MD  ondansetron (ZOFRAN ODT) 4 MG disintegrating tablet Take 1 tablet (4 mg total) by mouth every 8 (eight) hours as needed for nausea or vomiting. 09/14/20   Fondaw, Kathleene Hazel, PA  pantoprazole (PROTONIX) 40 MG tablet TAKE 1 TABLET (40 MG TOTAL) BY MOUTH 2 (TWO) TIMES DAILY. 10/22/20   Alcus Dad, MD  promethazine (PHENERGAN) 12.5 MG tablet Take 1 tablet (12.5 mg total) by mouth every 6 (six) hours as needed for nausea or vomiting. 07/27/20   Shelly Coss, MD  spironolactone (ALDACTONE) 25 MG tablet Take 12.5 mg by mouth daily.    [provider]  tiZANidine (ZANAFLEX) 4 MG tablet Take 4 mg by mouth 3 (three) times daily as needed for muscle spasms. 08/11/20   [provider]  torsemide (DEMADEX) 20 MG tablet Take 2 tablets (40 mg total) by mouth daily. 07/27/20   Shelly Coss, MD    Allergies    Diazepam, Gabapentin, Iodinated diagnostic agents, Isovue [iopamidol], Lisinopril, Metoclopramide, Nsaids, Penicillins, Tolmetin, Aspirin, Cyclobenzaprine, Rifamycins, Acetaminophen, Oxycodone, and Tramadol  Review of Systems   Review of Systems  Respiratory:  Negative for chest tightness and shortness of breath.   Cardiovascular:  Positive for chest pain.  Gastrointestinal:  Positive for abdominal pain, nausea and vomiting.  All other systems reviewed  and are negative.  Physical Exam Updated Vital Signs BP (!) 159/99   Pulse (!) 118   Temp 97.6 F (36.4 C) (Oral)   Resp (!) 23   Ht '5\' 6"'  (1.676 m)   Wt 127  kg   LMP 10/10/2012   SpO2 100%   BMI 45.19 kg/m   Physical Exam Vitals and nursing note reviewed.  Constitutional:      General: She is in acute distress.     Appearance: Normal appearance. She is obese. She is ill-appearing.  HENT:     Head: Normocephalic and atraumatic.  Eyes:     General:        Right eye: No discharge.        Left eye: No discharge.  Cardiovascular:     Rate and Rhythm: Regular rhythm. Tachycardia present.  Pulmonary:     Breath sounds: Normal breath sounds.     Comments: Tachypneic, breathing with much effort, no wheezes, rhonchi, breath sounds present in all lung fields. Abdominal:     General: Bowel sounds are decreased.     Comments: Extremely tender to palpation across abdomen with no rigidity, rebound, she does have some guarding.  Patient is unable to localize pain as she is writhing in acute pain during her interview.  No obvious focal tenderness apparent from exam.  Skin:    General: Skin is warm and dry.     Capillary Refill: Capillary refill takes less than 2 seconds.  Neurological:     Mental Status: She is alert and oriented to person, place, and time.  Psychiatric:        Mood and Affect: Mood is anxious.     Comments: Patient is distressed, screaming in pain, demanding pain medication, uncooperative with exam    ED Results / Procedures / Treatments   Labs (all labs ordered are listed, but only abnormal results are displayed) Labs Reviewed  COMPREHENSIVE METABOLIC PANEL - Abnormal; Notable for the following components:      Result Value   Potassium 2.4 (*)    CO2 19 (*)    Glucose, Bld 103 (*)    BUN 23 (*)    Creatinine, Ser 2.14 (*)    Total Protein 8.6 (*)    GFR, Estimated 27 (*)    All other components within normal limits  CBC - Abnormal; Notable for the  following components:   WBC 16.0 (*)    RBC 3.52 (*)    Hemoglobin 9.8 (*)    HCT 31.1 (*)    RDW 16.4 (*)    Platelets 459 (*)    All other components within normal limits  MAGNESIUM - Abnormal; Notable for the following components:   Magnesium 1.1 (*)    All other components within normal limits  LACTIC ACID, PLASMA - Abnormal; Notable for the following components:   Lactic Acid, Venous 2.4 (*)    All other components within normal limits  TROPONIN I (HIGH SENSITIVITY) - Abnormal; Notable for the following components:   Troponin I (High Sensitivity) 26 (*)    All other components within normal limits  LIPASE, BLOOD  URINALYSIS, ROUTINE W REFLEX MICROSCOPIC  LACTIC ACID, PLASMA  I-STAT BETA HCG BLOOD, ED ([facility], [facility], [facility] ONLY)  TROPONIN I (HIGH SENSITIVITY)    EKG EKG Interpretation  Date/Time:  Thursday November 20 2020 12:17:53 EDT Ventricular Rate:  128 PR Interval:  142 QRS Duration: 105 QT Interval:  328 QTC Calculation: 479 R Axis:   -40 Text Interpretation: Sinus tachycardia Multiform ventricular premature complexes Left axis deviation Minimal ST depression, anterolateral leads ST elevation, consider inferior injury similar to prior EKG Confirmed by Malvin Johns [phone]) on 11/20/2020 2:39:30 PM  Radiology No results found.  Procedures Procedures  Medications Ordered in ED Medications  potassium chloride 10 mEq in 100 mL IVPB (10 mEq Intravenous New Bag/Given 11/20/20 1518)  magnesium sulfate IVPB 1 g 100 mL (has no administration in time range)  HYDROmorphone (DILAUDID) injection 1 mg (1 mg Intravenous Given 11/20/20 1312)  potassium chloride SA (KLOR-CON) CR tablet 40 mEq (40 mEq Oral Given 11/20/20 1411)  HYDROmorphone (DILAUDID) injection 1 mg (1 mg Intravenous Given 11/20/20 1517)    ED Course  I have reviewed the triage vital signs and the nursing notes.  Pertinent labs & imaging results that were available during my care of the patient were reviewed by me  and considered in my medical decision making (see chart for details).  Clinical Course as of 11/20/20 1558  Thu Nov 20, 2020  1532 Abd pain, AKI, intrac pain, hypokalemia. Plan to admit after CT [facility] [BH]    Clinical Course User Index [BH] Henderly, Britni A, PA-C   MDM Rules/Calculators/A&P                         Patient comes in acute distress complaining of abdominal pain.  Initial lab work-up is significant for tachycardia, tachypnea, elevated white count, elevated lactate.  Patient was refusing CT due to pain, recommend giving another dose of pain medication at this time the to CT abdomen and pelvis.  Increased suspicion for acute abdominal obstruction, mesenteric ischemia, other intra-abdominal abnormality at this time.  Her lab work-up was also significant for potassium of 2.4, magnesium 1.1.  Electrolytes repleted.  Patient is on cardiac monitor, initial troponin is elevated, fever demand ischemia secondary to extreme elevated pulse rate, however patient has history of previous cardiac arrest, will monitor delta troponins.  Of note patient also has elevated creatinine to 2.14, from 1.16 13 days ago, evidence of acute kidney injury at this time.  3:58 PM Care of Deborah [name] transferred to [facility] and Dr. Godfrey Pick at the end of my shift as the patient will require reassessment once labs/imaging have resulted. Patient presentation, ED course, and plan of care discussed with review of all pertinent labs and imaging. Please see his/her note for further details regarding further ED course and disposition. Plan at time of handoff is admission pending results of CT scan with possible consult to surgery if bowel obstruction or other surgical abdomen present. This may be altered or completely changed at the discretion of the oncoming team pending results of further workup.  Final Clinical Impression(s) / ED Diagnoses Final diagnoses:  None    Rx / DC Orders ED Discharge  Orders     None        Anselmo Pickler, PA-C 11/20/20 1558    Malvin Johns, MD 11/26/20 1025

## 2020-11-20 NOTE — ED Triage Notes (Signed)
Pt. BIB EMS c/o abdominal pain x3 days. CP x1 day. States she vomited about 4 times today, and has not been able to eat. A&O x4.RR elevated at 30.

## 2020-11-20 NOTE — ED Notes (Signed)
Pt advises she does not feel the need to void at this time, will monitor.

## 2020-11-20 NOTE — H&P (Signed)
Deborah [name]:[id number] DOB: [birth year] DOA: 11/20/2020    PCP: Cena Benton, MD   Outpatient Specialists:    Patient arrived to ER on 11/20/20 at 1206 Referred by Attending Toy Baker, MD   Patient coming from: home Lives alone,     Chief Complaint:   Chief Complaint  Patient presents with   Abdominal Pain    HPI: Deborah RENNAKER is a 55 y.o. female with medical history significant of DM2, gastroparesis, chornic pain syndrome, a.fib on eliquis and and CHF, CVA recurrent nausea vomiting, cardiac arrest   Presented with  3 days of nausea /vomiting stabing abdominal pain  No diarrhea Reports chest pressure ongoing fo rhe past few hours But now feeling better No tobacco no etoh Wheelchair bound    Has  been vaccinated against COVID     Initial COVID TEST  NEGATIVE   Lab Results  Component Value Date   [place] 11/20/2020   [place] NEGATIVE 11/06/2020   [place] NEGATIVE 10/22/2020   [place] NEGATIVE 10/07/2020     Regarding pertinent Chronic problems:    Hyperlipidemia -  on statins Lipitor Lipid Panel     Component Value Date/Time   CHOL 104 07/22/2019 1529   CHOL 106 12/13/2018 0000   TRIG 185 (H) 07/22/2019 1529   HDL 30 (L) 07/22/2019 1529   HDL 36 (L) 12/13/2018 0000   CHOLHDL 3.5 07/22/2019 1529   VLDL 37 07/22/2019 1529   LDLCALC 37 07/22/2019 1529   LDLCALC 38 12/13/2018 0000   LABVLDL 32 12/13/2018 0000     HTN on hydralazine Imdur spironolactone  Chronic pain Dilaudid p.o.   chronic CHF  systolic - last echo May 3875 EF 45 to 50% Torsemide    History of malignant carcinoid tumor of duodenum     DM 2 -  Lab Results  Component Value Date   HGBA1C 7.8 (H) 10/08/2020   on insulin,       Morbid obesity-   BMI Readings from Last 1 Encounters:  11/20/20 45.19 kg/m    Asthma -well   controlled on home inhalers/     A. Fib -  - CHA2DS2 vas score  7      current  on  anticoagulation with  Eliquis,           -  Rate controled      CKD stage IIIb- baseline Cr 1.3 Estimated Creatinine Clearance: 40 mL/min (A) (by C-G formula based on SCr of 2.14 mg/dL (H)).  Lab Results  Component Value Date   CREATININE 2.14 (H) 11/20/2020   CREATININE 1.16 (H) 11/07/2020   CREATININE 1.31 (H) 11/06/2020     Chronic anemia - baseline hg Hemoglobin & Hematocrit  Recent Labs    11/06/20 1644 11/07/20 0822 11/20/20 1233  HGB 10.9* 9.5* 9.8*    While in ER: Noted to have hypokalemia and hypomagnesemia with elevated lactic acid up to 2.4 Troponin slightly elevated 26 and 38 but it is close to her baseline. CT abdomen done without acute abnormality. Patient having ongoing abdominal pain and still some nausea at this point hospitalist was called for admission. ED Triage Vitals  Enc Vitals Group     BP 11/20/20 1245 (!) 147/87     Pulse Rate 11/20/20 1224 60     Resp 11/20/20 1224 (!) 24     Temp 11/20/20 1512 97.6 F (36.4 C)     Temp Source 11/20/20 1512 Oral     SpO2  11/20/20 1212 100 %     Weight 11/20/20 1217 279 lb 15.8 oz (127 kg)     Height 11/20/20 1217 _0  (1.676 m)     Head Circumference --      Peak Flow --      Pain Score 11/20/20 1216 10     Pain Loc --      Pain Edu? --      Excl. in [place]? --   TMAX(24)@     _________________________________________ Significant initial  Findings: Abnormal Labs Reviewed  COMPREHENSIVE METABOLIC PANEL - Abnormal; Notable for the following components:      Result Value   Potassium 2.4 (*)    CO2 19 (*)    Glucose, Bld 103 (*)    BUN 23 (*)    Creatinine, Ser 2.14 (*)    Total Protein 8.6 (*)    GFR, Estimated 27 (*)    All other components within normal limits  CBC - Abnormal; Notable for the following components:   WBC 16.0 (*)    RBC 3.52 (*)    Hemoglobin 9.8 (*)    HCT 31.1 (*)    RDW 16.4 (*)    Platelets 459 (*)    All other components within normal limits  URINALYSIS, ROUTINE W REFLEX  MICROSCOPIC - Abnormal; Notable for the following components:   Hgb urine dipstick SMALL (*)    Ketones, ur 5 (*)    Protein, ur >=300 (*)    Bacteria, UA RARE (*)    All other components within normal limits  MAGNESIUM - Abnormal; Notable for the following components:   Magnesium 1.1 (*)    All other components within normal limits  LACTIC ACID, PLASMA - Abnormal; Notable for the following components:   Lactic Acid, Venous 2.4 (*)    All other components within normal limits  RAPID URINE DRUG SCREEN, HOSP PERFORMED - Abnormal; Notable for the following components:   Opiates POSITIVE (*)    All other components within normal limits  BASIC METABOLIC PANEL - Abnormal; Notable for the following components:   Potassium 3.2 (*)    Glucose, Bld 162 (*)    BUN 21 (*)    Creatinine, Ser 1.72 (*)    Calcium 8.2 (*)    GFR, Estimated 34 (*)    All other components within normal limits  GLUCOSE, CAPILLARY - Abnormal; Notable for the following components:   Glucose-Capillary 156 (*)    All other components within normal limits  TROPONIN I (HIGH SENSITIVITY) - Abnormal; Notable for the following components:   Troponin I (High Sensitivity) 26 (*)    All other components within normal limits  TROPONIN I (HIGH SENSITIVITY) - Abnormal; Notable for the following components:   Troponin I (High Sensitivity) 38 (*)    All other components within normal limits  TROPONIN I (HIGH SENSITIVITY) - Abnormal; Notable for the following components:   Troponin I (High Sensitivity) 52 (*)    All other components within normal limits   ____________________________________________ Ordered    CTabd/pelvis -  non-acute  4.9 cm soft tissue density lesion in the left adnexa, unchanged going back multiple studies _________________________ Troponin 26- 38-52 ECG: Ordered Personally reviewed by me showing: HR : 128 Rhythm:  Sinus tachycardia w PVC  no evidence of ischemic changes QTC 479 _________________   The recent clinical data is shown below. Vitals:   11/20/20 1430 11/20/20 1512 11/20/20 1530 11/20/20 1630  BP: (!) 159/99  (!) 151/88 (!) 171/106  Pulse: (!) 118  (!) 112 (!) 124  Resp: (!) 23  (!) 25 (!) 24  Temp:  97.6 F (36.4 C)    TempSrc:  Oral    SpO2: 100%  100% 100%  Weight:      Height:        WBC     Component Value Date/Time   WBC 16.0 (H) 11/20/2020 1233   LYMPHSABS 3.0 11/06/2020 1633   LYMPHSABS 2.2 09/03/2019 1620   MONOABS 0.7 11/06/2020 1633   EOSABS 0.4 11/06/2020 1633   EOSABS 0.3 09/03/2019 1620   BASOSABS 0.1 11/06/2020 1633   BASOSABS 0.1 09/03/2019 1620    Lactic Acid, Venous    Component Value Date/Time   LATICACIDVEN 1.8 11/20/2020 1652       UA  no evidence of UTI     Urine analysis:    Component Value Date/Time   COLORURINE YELLOW 11/20/2020 1859   APPEARANCEUR CLEAR 11/20/2020 1859   LABSPEC 1.011 11/20/2020 1859   PHURINE 5.0 11/20/2020 1859   GLUCOSEU NEGATIVE 11/20/2020 1859   HGBUR SMALL (A) 11/20/2020 1859   BILIRUBINUR NEGATIVE 11/20/2020 1859   KETONESUR 5 (A) 11/20/2020 1859   PROTEINUR >=300 (A) 11/20/2020 1859   UROBILINOGEN 0.2 10/02/2013 2108   NITRITE NEGATIVE 11/20/2020 1859   LEUKOCYTESUR NEGATIVE 11/20/2020 1859    Results for orders placed or performed during the hospital encounter of 11/20/20  Resp Panel by RT-PCR (Flu A&B, Covid) Nasopharyngeal Swab     Status: None   Collection Time: 11/20/20  7:00 PM   Specimen: Nasopharyngeal Swab; Nasopharyngeal(NP) swabs in vial transport medium  Result Value Ref Range Status   SARS Coronavirus 2 by RT PCR NEGATIVE NEGATIVE Final         Influenza A by PCR NEGATIVE NEGATIVE Final   Influenza B by PCR NEGATIVE NEGATIVE Final          _______________________________________________ Hospitalist was called for admission for intractable nausea vomiting abdominal pain  The following Work up has been ordered so far:  Orders Placed This Encounter  Procedures    Critical Care   Resp Panel by RT-PCR (Flu A&B, Covid) Nasopharyngeal Swab   CT ABDOMEN PELVIS WO CONTRAST   Lipase, blood   Comprehensive metabolic panel   CBC   Urinalysis, Routine w reflex microscopic   Magnesium   Lactic acid, plasma   Rapid urine drug screen (hospital performed)   Diet NPO time specified   Cardiac monitoring   Consult to hospitalist   Airborne and Contact precautions   I-Stat beta hCG blood, ED   ED EKG   EKG 12-Lead   Place in observation (patient's expected length of stay will be less than 2 midnights)     Following Medications were ordered in ER: Medications  potassium chloride 10 mEq in 100 mL IVPB (10 mEq Intravenous New Bag/Given 11/20/20 1804)  magnesium sulfate IVPB 1 g 100 mL (has no administration in time range)  HYDROmorphone (DILAUDID) injection 1 mg (1 mg Intravenous Given 11/20/20 1312)  potassium chloride SA (KLOR-CON) CR tablet 40 mEq (40 mEq Oral Given 11/20/20 1411)  HYDROmorphone (DILAUDID) injection 1 mg (1 mg Intravenous Given 11/20/20 1517)  ondansetron (ZOFRAN) injection 4 mg (4 mg Intravenous Given 11/20/20 1644)  HYDROmorphone (DILAUDID) injection 0.5 mg (0.5 mg Intravenous Given 11/20/20 1806)        Consult Orders  (From admission, onward)           Start     Ordered  11/20/20 1801  Consult to hospitalist  Once       Provider:  (Not yet assigned)  Question Answer Comment  Place call to: [facility]   Reason for Consult Admit      11/20/20 1800            OTHER Significant initial  Findings:  labs showing:    Recent Labs  Lab 11/20/20 1233 11/20/20 2315 11/21/20 0011  NA 138 138 138  K 2.4* 3.2* 3.0*  CO2 19* 22 22  GLUCOSE 103* 162* 159*  BUN 23* 21* 20  CREATININE 2.14* 1.72* 1.79*  CALCIUM 8.9 8.2* 8.2*  MG 1.1*  --  1.4*  PHOS  --   --  3.7    Cr   Up from baseline see below Lab Results  Component Value Date   CREATININE 2.14 (H) 11/20/2020   CREATININE 1.16 (H) 11/07/2020   CREATININE  1.31 (H) 11/06/2020    Recent Labs  Lab 11/20/20 1233  AST 20  ALT 14  ALKPHOS 102  BILITOT 0.6  PROT 8.6*  ALBUMIN 3.7   Lab Results  Component Value Date   CALCIUM 8.2 (L) 11/21/2020   PHOS 3.7 11/21/2020    Plt: Lab Results  Component Value Date   PLT 459 (H) 11/20/2020      Recent Labs  Lab 11/20/20 1233  WBC 16.0*  HGB 9.8*  HCT 31.1*  MCV 88.4  PLT 459*    HG/HCT  stable,      Component Value Date/Time   HGB 9.8 (L) 11/20/2020 1233   HGB 8.8 (L) 10/09/2019 0906   HGB 9.6 (L) 09/03/2019 1620   HCT 31.1 (L) 11/20/2020 1233   HCT 30.4 (L) 09/03/2019 1620   MCV 88.4 11/20/2020 1233   MCV 85 09/03/2019 1620      Recent Labs  Lab 11/20/20 1233  LIPASE 22   No results for input(s): AMMONIA in the last 168 hours.   Cardiac Panel (last 3 results) No results for input(s): CKTOTAL, CKMB, TROPONINI, RELINDX in the last 72 hours.   BNP (last 3 results) Recent Labs    01/23/20 1315 05/25/20 1102 08/28/20 0937  BNP 165.0* 51.2 43.7    DM  labs:  HbA1C: Recent Labs    03/29/20 1351 07/21/20 0707 10/08/20 1352  HGBA1C 8.0* 10.5* 7.8*       CBG (last 3)  No results for input(s): GLUCAP in the last 72 hours.    Cultures:    Component Value Date/Time   SDES  01/26/2020 1037    URINE, CLEAN CATCH Performed at [facility], [place] [address], [place], [place] [address]    [place]  01/26/2020 1037    NONE Performed at [facility], [place] [address], [place], [place] [address]    CULT (A) 01/26/2020 1037    <10,000 COLONIES/mL INSIGNIFICANT GROWTH Performed at [facility] [address], [place],  [address]    REPTSTATUS 01/28/2020 FINAL 01/26/2020 1037     Radiological Exams on Admission: CT ABDOMEN PELVIS WO CONTRAST  Result Date: 11/20/2020 CLINICAL DATA:  Epigastric pain EXAM: CT ABDOMEN AND PELVIS WITHOUT CONTRAST TECHNIQUE: Multidetector CT imaging of the abdomen and pelvis was  performed following the standard protocol without IV contrast. COMPARISON:  CT abdomen/pelvis 11/07/2020 FINDINGS: Lower chest: The lung bases are clear. The imaged heart is unremarkable. Hepatobiliary: The liver is unremarkable. The gallbladder is surgically absent. There is no biliary ductal dilatation. Pancreas: Unremarkable.  Spleen: Unremarkable Adrenals/Urinary Tract: The adrenals are unremarkable. The kidneys are normal, with no focal lesion, stone, hydronephrosis, or hydroureter. The bladder is unremarkable. Stomach/Bowel: The stomach is unremarkable. There is no evidence of bowel obstruction. There is no abnormal bowel wall thickening or inflammatory change. The appendix is normal. Vascular/Lymphatic: The abdominal aorta is nonaneurysmal with minimal calcified atherosclerotic plaque. There is no abdominal or pelvic lymphadenopathy. Reproductive: There is a 4.9 cm soft tissue density lesion in the left adnexa, unchanged going back multiple studies. There is no new adnexal lesion. Other: There is no ascites or free air. Musculoskeletal: There are degenerative changes at L5-S1 with bulky anterior osteophytes, unchanged. IMPRESSION: No acute findings in the abdomen or pelvis. No change since 11/07/2020. Aortic Atherosclerosis (ICD10-I70.0). Electronically Signed   By: Valetta Mole M.D.   On: 11/20/2020 16:19   _______________________________________________________________________________________________________ Latest  Blood pressure (!) 171/106, pulse (!) 124, temperature 97.6 F (36.4 C), temperature source Oral, resp. rate (!) 24, height _0  (1.676 m), weight 127 kg, last menstrual period 10/10/2012, SpO2 100 %.   Review of Systems:    Pertinent positives include:   fatigue, , indigestion, abdominal pain, nausea, vomiting, chest pain,  shortness of breath at rest.   Constitutional:  No weight loss, night sweats, Fevers, chills,weight loss  HEENT:  No headaches, Difficulty  swallowing,Tooth/dental problems,Sore throat,  No sneezing, itching, ear ache, nasal congestion, post nasal drip,  Cardio-vascular:  No Orthopnea, PND, anasarca, dizziness, palpitations.no Bilateral lower extremity swelling  GI:  No heart burn diarrhea, change in bowel habits, loss of appetite, melena, blood in stool, hematemesis Resp:  noNo dyspnea on exertion, No excess mucus, no productive cough, No non-productive cough, No coughing up of blood.No change in color of mucus.No wheezing. Skin:  no rash or lesions. No jaundice GU:  no dysuria, change in color of urine, no urgency or frequency. No straining to urinate.  No flank pain.  Musculoskeletal:  No joint pain or no joint swelling. No decreased range of motion. No back pain.  Psych:  No change in mood or affect. No depression or anxiety. No memory loss.  Neuro: no localizing neurological complaints, no tingling, no weakness, no double vision, no gait abnormality, no slurred speech, no confusion  All systems reviewed and apart from [place] all are negative _______________________________________________________________________________________________ Past Medical History:   Past Medical History:  Diagnosis Date   Acute back pain with sciatica, left    Acute back pain with sciatica, right    AKI (acute kidney injury) ([place])    Anemia, unspecified    Cancer ([place])    Carcinoid tumor of duodenum    Chronic pain    Chronic systolic CHF (congestive heart failure) (HCC)    Diabetes mellitus    Esophageal reflux    Fibromyalgia    Gastric ulcer    Gastroparesis    Gout    Hyperlipidemia    Hypertension    Lumbosacral stenosis    NICM (nonischemic cardiomyopathy) (HCC)    Obesity    PAF (paroxysmal atrial fibrillation) ([place])    Stroke ([place]) 02/2011   Vitamin B12 deficiency anemia      Past Surgical History:  Procedure Laterality Date   BIOPSY  07/27/2019   Procedure: BIOPSY;  Surgeon: Clarene Essex, MD;  Location: [facility]  ENDOSCOPY;  Service: Endoscopy;;   BIOPSY  07/30/2019   Procedure: BIOPSY;  Surgeon: Otis Brace, MD;  Location: [facility] ENDOSCOPY;  Service: Gastroenterology;;   CATARACT EXTRACTION  01/2014   CHOLECYSTECTOMY  COLONOSCOPY WITH PROPOFOL N/A 07/30/2019   Procedure: COLONOSCOPY WITH PROPOFOL;  Surgeon: Otis Brace, MD;  Location: [facility] ENDOSCOPY;  Service: Gastroenterology;  Laterality: N/A;   ESOPHAGOGASTRODUODENOSCOPY N/A 07/27/2019   Procedure: ESOPHAGOGASTRODUODENOSCOPY (EGD);  Surgeon: Clarene Essex, MD;  Location: [facility];  Service: Endoscopy;  Laterality: N/A;   ESOPHAGOGASTRODUODENOSCOPY N/A 07/26/2020   Procedure: ESOPHAGOGASTRODUODENOSCOPY (EGD);  Surgeon: Arta Silence, MD;  Location: [facility]OSCOPY;  Service: Endoscopy;  Laterality: N/A;   ESOPHAGOGASTRODUODENOSCOPY (EGD) WITH PROPOFOL N/A 08/02/2019   Procedure: ESOPHAGOGASTRODUODENOSCOPY (EGD) WITH PROPOFOL;  Surgeon: Otis Brace, MD;  Location: [facility] ENDOS[facility];  Service: Gastroenterology;  Laterality: N/A;   HEMOSTASIS CLIP PLACEMENT  08/02/2019   Procedure: HEMOSTASIS CLIP PLACEMENT;  Surgeon: Otis Brace, MD;  Location: [facility] ENDOSCOPY;  Service: Gastroenterology;;   POLYPECTOMY  07/30/2019   Procedure: POLYPECTOMY;  Surgeon: Otis Brace, MD;  Location: [facility] ENDOS[facility];  Service: Gastroenterology;;   POLYPECTOMY  08/02/2019   Procedure: POLYPECTOMY;  Surgeon: Otis Brace, MD;  Location: [facility] ENDOSCOPY;  Service: Gastroenterology;;    Social History:  Ambulatory  wheelchair bound,      reports that she has never smoked. She has never used smokeless tobacco. She reports that she does not drink alcohol and does not use drugs.    Family History:   Family History  Problem Relation Age of Onset   Diabetes Mother    Diabetes Father    Heart disease Father    Diabetes Sister    Congestive Heart Failure Sister 45   Diabetes Brother     ______________________________________________________________________________________________ Allergies: Allergies  Allergen Reactions   Diazepam [name]   Gabapentin [name] and Swelling    Other reaction(s): Unknown   Iodinated Diagnostic Agents Anaphylaxis    11/29/17 Cardiac arrest 1 min after IV contrast, possible allergy vs vasovagal episode Iopamidol  Anaphylaxis  High 11/28/2017  Patient had seizure like activity and then code post 100 cc of isovue 300     Isovue [Iopamidol] Anaphylaxis    11/28/17 Patient had seizure like activity and then 1 min code after 100 cc of isovue 300. Possible contrast allergy vs vasovagal episode   Lisinopril Anaphylaxis    Tongue and mouth swelling   Metoclopramide Other (See Comments)    Tardive dyskinesia   Nsaids Anaphylaxis and Other (See Comments)    ULCER   Penicillins Palpitations    Has patient had a PCN reaction causing immediate rash, facial/tongue/throat swelling, SOB or lightheadedness with hypotension: Yes, heart races Has patient had a PCN reaction causing severe rash involving mucus membranes or skin necrosis: No Has patient had a PCN reaction that required hospitalization: Yes  Has patient had a PCN reaction occurring within the last 10 years: No    Tolmetin Nausea Only and Other (See Comments)    ULCER   Aspirin Other (See Comments)    Irritates stomach ulcer    Cyclobenzaprine     Other reaction(s): Unknown   Rifamycins    Acetaminophen Nausea Only and Other (See Comments)    Irritates stomach ulcer; Abdominal pain   Oxycodone Palpitations   Tramadol Nausea And Vomiting     Prior to Admission medications   Medication Sig Start Date End Date Taking? Authorizing Provider  albuterol (PROVENTIL) (2.5 MG/3ML) 0.083% nebulizer solution Take 3 mLs (2.5 mg total) by nebulization every 6 (six) hours as needed for wheezing or shortness of breath. 04/06/19   Scot Jun, FNP  atorvastatin  (LIPITOR) 10 MG tablet TAKE ONE TABLET BY MOUTH  ONCE DAILY (AM) Patient taking differently: Take 10 mg by mouth daily. 10/22/20   Alcus Dad, MD  blood glucose meter kit and supplies KIT Dispense based on patient and insurance preference. Use up to four times daily as directed. (FOR ICD-9 250.00, 250.01). 12/13/18   [name], MD  cetirizine (ZYRTEC) 10 MG tablet Take 10 mg by mouth daily. 10/21/20   [provider]  DULoxetine HCl 40 MG CPEP TAKE ONE CAPSULE BY MOUTH TWICE DAILY Patient taking differently: Take 40 mg by mouth in the morning and at bedtime. 10/22/20   Alcus Dad, MD  EASY COMFORT PEN NEEDLES 31G X 5 MM MISC USE 3 TIMES A DAY FOR INSULIN ADMINISTRATION 11/14/19   Meccariello, Mel Almond J, DO  ELIQUIS 5 MG TABS tablet TAKE 1 TABLET BY MOUTH 2 (TWO) TIMES DAILY. Patient taking differently: Take 5 mg by mouth 2 (two) times daily. 12/20/19   Meccariello, Bernita Raisin, DO  fluticasone (FLONASE) 50 MCG/ACT nasal spray Place 2 sprays into both nostrils daily as needed for allergies or rhinitis. 12/19/18   Rai, Vernelle Emerald, MD  hydrALAZINE (APRESOLINE) 50 MG tablet Take 1 tablet (50 mg total) by mouth 3 (three) times daily. 09/10/20   Alcus Dad, MD  HYDROmorphone (DILAUDID) 4 MG tablet Take 1 tablet (4 mg total) by mouth 2 (two) times daily as needed for severe pain. 11/04/20   Raulkar, Clide Deutscher, MD  isosorbide mononitrate (IMDUR) 60 MG 24 hr tablet TAKE 1 TABLET (60 MG TOTAL) BY MOUTH DAILY. 09/10/20   Alcus Dad, MD  LANTUS SOLOSTAR 100 UNIT/ML Solostar Pen Inject 40 Units into the skin daily. Patient taking differently: Inject 50 Units into the skin at bedtime. 10/13/20 11/14/20  Dwyane Dee, MD  melatonin 3 MG TABS tablet TAKE 2 TABLETS (6 MG TOTAL) BY MOUTH AT BEDTIME. 08/11/20   Meccariello, Bernita Raisin, DO  metoprolol succinate (TOPROL-XL) 25 MG 24 hr tablet Take 25 mg by mouth daily. 09/05/20   [provider]  nitroGLYCERIN (NITROSTAT) 0.4 MG SL tablet Place 1  tablet (0.4 mg total) under the tongue every 5 (five) minutes as needed for chest pain. 12/13/18   [name], MD  NOVOLOG FLEXPEN 100 UNIT/ML FlexPen Inject 1-15 Units into the skin in the morning, at noon, in the evening, and at bedtime. CBG 121 - 150: 2 units, CBG 151 - 200: 3 units, CBG 201 - 250: 5 units, CBG 251 - 300: 8 units, CBG 301 - 350: 11 units, CBG 351 - 400: 15 units, CBG > 400 call MD Patient taking differently: Inject 30 Units into the skin in the morning, at noon, in the evening, and at bedtime. 10/13/20   Dwyane Dee, MD  ondansetron (ZOFRAN ODT) 4 MG disintegrating tablet Take 1 tablet (4 mg total) by mouth every 8 (eight) hours as needed for nausea or vomiting. 09/14/20   [place]ond[place], Kathleene Hazel, PA  pantoprazole (PROTONIX) 40 MG tablet TAKE 1 TABLET (40 MG TOTAL) BY MOUTH 2 (TWO) TIMES DAILY. 10/22/20   Alcus Dad, MD  promethazine (PHENERGAN) 12.5 MG tablet Take 1 tablet (12.5 mg total) by mouth every 6 (six) hours as needed for nausea or vomiting. 07/27/20   Shelly Coss, MD  spironolactone (ALDACTONE) 25 MG tablet Take 12.5 mg by mouth daily.    [provider]  tiZANidine (ZANAFLEX) 4 MG tablet Take 4 mg by mouth 3 (three) times daily as needed for muscle spasms. 08/11/20   [provider]  torsemide (DEMADEX) 20 MG tablet  Take 2 tablets (40 mg total) by mouth daily. 07/27/20   Shelly Coss, MD    ___________________________________________________________________________________________________ Physical Exam: Vitals with BMI 11/20/2020 11/20/2020 11/20/2020  Height - - -  Weight - - -  BMI - - -  Systolic [phone]  Diastolic 383 88 99  Pulse 124 112 118     1. General:  in No  Acute distress increased work of breathing  complaining of severe pain   Chronically ill -appearing 2. Psychological: Alert and  Oriented 3. Head/ENT:    Dry Mucous Membranes                          Head Non traumatic, neck supple                         Poor  Dentition 4. SKIN: decreased Skin turgor,  Skin clean Dry and intact no rash 5. Heart: Regular rate and rhythm no  Murmur, no Rub or gallop 6. Lungs:  no wheezes or crackles   7. Abdomen: Soft,  non-tender, Non distended   obese  bowel sounds present 8. Lower extremities: no clubbing, cyanosis, no  edema 9. Neurologically Grossly intact, moving all 4 extremities equally  intact 10. MSK: Normal range of motion  Chart has been reviewed  ______________________________________________________________________________________________  Assessment/Plan 55 y.o. female with medical history significant of DM2, gastroparesis, chornic pain syndrome, a.fib on eliquis and and CHF, CVA recurrent nausea vomiting   Admitted for AKI  Present on Admission:  Hypokalemia - will replace - and follow   Uncontrolled type 2 diabetes mellitus with hyperglycemia ([place]) -   Order Sensitive  SSI   - continue home insulin regimen     Lantus 50 units,  -  check TSH and HgA1C  - Hold by mouth medications    Prolonged QT interval - - will monitor on tele avoid QT prolonging medications, rehydrate correct electrolytes   PAF (paroxysmal atrial fibrillation) (HCC) continue Eliquis   Nonalcoholic fatty liver disease  Chronic stable   Morbid obesity ([place]) would benefit from outpatient follow-up   Intractable nausea and vomiting -most likely secondary to gastroparesis avoid QT prolonging medications avoid IV narcotics if possible   GERD (gastroesophageal reflux disease) PPI   Essential hypertension, benign -resume home medications when able to tolerate hold spironolactone for tonight Hold torsemide given dehydration     Chronic systolic CHF (congestive heart failure) (HCC)  -- currently appears to be slightly on the dry side, hold home diuretics for tonight and restart when appears euvolemic, carefuly follow fluid status and Cr   Chronic kidney disease, stage 3b (HCC) -  -chronic avoid nephrotoxic medications  such as NSAIDs, Vanco Zosyn combo,  avoid hypotension, continue to follow renal function   Chest pain -nonspecific troponin mildly elevated within patient's range.  No EKG changes suggestive of ischemia.  Patient does have extensive risk factors.  Emailed cardiology to see if patient needs further work-up   Acute renal failure superimposed on stage 3b chronic kidney disease ([place]) most likely secondary to dehydration we will gently have a full renal status   Abdominal pain -CT abdomen reassuring.  Continue supportive management   Elevated troponin mildly elevated obtain echogram appreciate cardiology input Patient was on anticoagulation   Normocytic anemia -obtain anemia     Malignant carcinoid tumor of duodenum ([place]) follow-up as an outpatient   Leukocytosis -  Chronic stable   Hypomagnesemia -  replace and recheck in a.m.   Chronic pain -May need to continue similar home medications try to avoid IV narcotics but patient states she is unable to tolerate p.o.   Other plan as per orders.  DVT prophylaxis:  Eliquis     Code Status:    Code Status: Prior FULL CODE e as per patient   I had personally discussed CODE STATUS with patient    Family Communication:   Family   at  Bedside   Disposition Plan:     To home once workup is complete and patient is stable   Following barriers for discharge:                            Electrolytes corrected                               Anemia stable                             Pain controlled with PO medications                                                            Will need to be able to tolerate PO                            Will likely need home health, home O2, set up                                            Would benefit from PT/OT eval prior to [place] called:emailed cardiology  Admission status:  ED Disposition     ED Disposition  [place]: [place] [[id number]]  Level of Care: Progressive [102]  Admit to Progressive based on following criteria: CARDIOVASCULAR & THORACIC of moderate stability with acute coronary syndrome symptoms/low risk myocardial infarction/hypertensive urgency/arrhythmias/heart failure potentially compromising stability and stable post cardiovascular intervention patients.  May place patient in observation at [facility] or [place] if equivalent level of care is available:: No  Covid Evaluation: Asymptomatic Screening Protocol (No Symptoms)  Diagnosis: Hypokalemia [[id number]]  Admitting Physician: Toy Baker [[id number]]  Attending Physician: Toy Baker [[id number]]          Obs      Level of care   progressive  tele indefinitely please discontinue once patient no longer qualifies COVID-19 Labs    Lab Results  Component Value Date   [place] 11/20/2020     Precautions: admitted as  Covid Negative    PPE: Used by the provider:   N95  eye Goggles,  Gloves  Nasia Cannan 11/21/2020, 1:42 AM    [facility]     after 2 AM please page floor coverage PA If 7AM-7PM, please contact the day team taking care of  the patient using [email or web address]   Patient was evaluated in the context of the global COVID-19 pandemic, which necessitated consideration that the patient might be at risk for infection with the SARS-CoV-2 virus that causes COVID-19. Institutional protocols and algorithms that pertain to the evaluation of patients at risk for COVID-19 are in a state of rapid change based on information released by regulatory bodies including the CDC and federal and state organizations. These policies and algorithms were followed during the patient's care.

## 2020-11-21 ENCOUNTER — Encounter (HOSPITAL_COMMUNITY): Payer: Self-pay | Admitting: Internal Medicine

## 2020-11-21 ENCOUNTER — Observation Stay (HOSPITAL_COMMUNITY): Payer: Medicare Other

## 2020-11-21 DIAGNOSIS — E1122 Type 2 diabetes mellitus with diabetic chronic kidney disease: Secondary | ICD-10-CM | POA: Diagnosis present

## 2020-11-21 DIAGNOSIS — R778 Other specified abnormalities of plasma proteins: Secondary | ICD-10-CM | POA: Diagnosis present

## 2020-11-21 DIAGNOSIS — Z20822 Contact with and (suspected) exposure to covid-19: Secondary | ICD-10-CM | POA: Diagnosis present

## 2020-11-21 DIAGNOSIS — E860 Dehydration: Secondary | ICD-10-CM | POA: Diagnosis present

## 2020-11-21 DIAGNOSIS — R079 Chest pain, unspecified: Secondary | ICD-10-CM

## 2020-11-21 DIAGNOSIS — E785 Hyperlipidemia, unspecified: Secondary | ICD-10-CM | POA: Diagnosis present

## 2020-11-21 DIAGNOSIS — K3184 Gastroparesis: Secondary | ICD-10-CM | POA: Diagnosis present

## 2020-11-21 DIAGNOSIS — E872 Acidosis: Secondary | ICD-10-CM | POA: Diagnosis present

## 2020-11-21 DIAGNOSIS — Z6841 Body Mass Index (BMI) 40.0 and over, adult: Secondary | ICD-10-CM | POA: Diagnosis not present

## 2020-11-21 DIAGNOSIS — D519 Vitamin B12 deficiency anemia, unspecified: Secondary | ICD-10-CM | POA: Diagnosis present

## 2020-11-21 DIAGNOSIS — I428 Other cardiomyopathies: Secondary | ICD-10-CM | POA: Diagnosis present

## 2020-11-21 DIAGNOSIS — R1110 Vomiting, unspecified: Secondary | ICD-10-CM | POA: Diagnosis present

## 2020-11-21 DIAGNOSIS — D72829 Elevated white blood cell count, unspecified: Secondary | ICD-10-CM | POA: Diagnosis present

## 2020-11-21 DIAGNOSIS — E1143 Type 2 diabetes mellitus with diabetic autonomic (poly)neuropathy: Secondary | ICD-10-CM | POA: Diagnosis present

## 2020-11-21 DIAGNOSIS — I5022 Chronic systolic (congestive) heart failure: Secondary | ICD-10-CM | POA: Diagnosis present

## 2020-11-21 DIAGNOSIS — E1165 Type 2 diabetes mellitus with hyperglycemia: Secondary | ICD-10-CM | POA: Diagnosis present

## 2020-11-21 DIAGNOSIS — N179 Acute kidney failure, unspecified: Secondary | ICD-10-CM | POA: Diagnosis present

## 2020-11-21 DIAGNOSIS — I130 Hypertensive heart and chronic kidney disease with heart failure and stage 1 through stage 4 chronic kidney disease, or unspecified chronic kidney disease: Secondary | ICD-10-CM | POA: Diagnosis present

## 2020-11-21 DIAGNOSIS — R072 Precordial pain: Secondary | ICD-10-CM

## 2020-11-21 DIAGNOSIS — R109 Unspecified abdominal pain: Secondary | ICD-10-CM | POA: Diagnosis not present

## 2020-11-21 DIAGNOSIS — N1832 Chronic kidney disease, stage 3b: Secondary | ICD-10-CM | POA: Diagnosis present

## 2020-11-21 DIAGNOSIS — D490 Neoplasm of unspecified behavior of digestive system: Secondary | ICD-10-CM | POA: Diagnosis present

## 2020-11-21 DIAGNOSIS — I700 Atherosclerosis of aorta: Secondary | ICD-10-CM | POA: Diagnosis present

## 2020-11-21 DIAGNOSIS — D631 Anemia in chronic kidney disease: Secondary | ICD-10-CM | POA: Diagnosis present

## 2020-11-21 DIAGNOSIS — I480 Paroxysmal atrial fibrillation: Secondary | ICD-10-CM | POA: Diagnosis present

## 2020-11-21 DIAGNOSIS — K760 Fatty (change of) liver, not elsewhere classified: Secondary | ICD-10-CM | POA: Diagnosis present

## 2020-11-21 DIAGNOSIS — C7A010 Malignant carcinoid tumor of the duodenum: Secondary | ICD-10-CM | POA: Diagnosis present

## 2020-11-21 LAB — ECHOCARDIOGRAM COMPLETE
Area-P 1/2: 4.06 cm2
Calc EF: 49 %
Height: 66 in
S' Lateral: 3.1 cm
Single Plane A2C EF: 47.8 %
Single Plane A4C EF: 49.7 %
Weight: 4546.77 oz

## 2020-11-21 LAB — BASIC METABOLIC PANEL
Anion gap: 13 (ref 5–15)
BUN: 21 mg/dL — ABNORMAL HIGH (ref 6–20)
CO2: 22 mmol/L (ref 22–32)
Calcium: 8.2 mg/dL — ABNORMAL LOW (ref 8.9–10.3)
Chloride: 103 mmol/L (ref 98–111)
Creatinine, Ser: 1.72 mg/dL — ABNORMAL HIGH (ref 0.44–1.00)
GFR, Estimated: 34 mL/min — ABNORMAL LOW (ref >60)
Glucose, Bld: 162 mg/dL — ABNORMAL HIGH (ref 70–99)
Potassium: 3.2 mmol/L — ABNORMAL LOW (ref 3.5–5.1)
Sodium: 138 mmol/L (ref 135–145)

## 2020-11-21 LAB — GLUCOSE, CAPILLARY
Glucose-Capillary: 156 mg/dL — ABNORMAL HIGH (ref 70–99)
Glucose-Capillary: 168 mg/dL — ABNORMAL HIGH (ref 70–99)
Glucose-Capillary: 199 mg/dL — ABNORMAL HIGH (ref 70–99)
Glucose-Capillary: 267 mg/dL — ABNORMAL HIGH (ref 70–99)
Glucose-Capillary: 312 mg/dL — ABNORMAL HIGH (ref 70–99)
Glucose-Capillary: 371 mg/dL — ABNORMAL HIGH (ref 70–99)

## 2020-11-21 LAB — IRON AND TIBC
Iron: 73 ug/dL (ref 28–170)
Saturation Ratios: 20 % (ref 10.4–31.8)
TIBC: 370 ug/dL (ref 250–450)
UIBC: 297 ug/dL

## 2020-11-21 LAB — CBC WITH DIFFERENTIAL/PLATELET
Abs Immature Granulocytes: 0.15 10*3/uL — ABNORMAL HIGH (ref 0.00–0.07)
Basophils Absolute: 0.1 10*3/uL (ref 0.0–0.1)
Basophils Relative: 1 %
Eosinophils Absolute: 0.1 10*3/uL (ref 0.0–0.5)
Eosinophils Relative: 1 %
HCT: 30.7 % — ABNORMAL LOW (ref 36.0–46.0)
Hemoglobin: 9.6 g/dL — ABNORMAL LOW (ref 12.0–15.0)
Immature Granulocytes: 1 %
Lymphocytes Relative: 22 %
Lymphs Abs: 3.8 10*3/uL (ref 0.7–4.0)
MCH: 27.8 pg (ref 26.0–34.0)
MCHC: 31.3 g/dL (ref 30.0–36.0)
MCV: 89 fL (ref 80.0–100.0)
Monocytes Absolute: 0.7 10*3/uL (ref 0.1–1.0)
Monocytes Relative: 4 %
Neutro Abs: 12 10*3/uL — ABNORMAL HIGH (ref 1.7–7.7)
Neutrophils Relative %: 71 %
Platelets: 451 10*3/uL — ABNORMAL HIGH (ref 150–400)
RBC: 3.45 MIL/uL — ABNORMAL LOW (ref 3.87–5.11)
RDW: 16.8 % — ABNORMAL HIGH (ref 11.5–15.5)
WBC: 16.9 10*3/uL — ABNORMAL HIGH (ref 4.0–10.5)
nRBC: 0 % (ref 0.0–0.2)

## 2020-11-21 LAB — TROPONIN I (HIGH SENSITIVITY): Troponin I (High Sensitivity): 52 ng/L — ABNORMAL HIGH (ref <18)

## 2020-11-21 LAB — COMPREHENSIVE METABOLIC PANEL
ALT: 12 U/L (ref 0–44)
AST: 17 U/L (ref 15–41)
Albumin: 3.4 g/dL — ABNORMAL LOW (ref 3.5–5.0)
Alkaline Phosphatase: 99 U/L (ref 38–126)
Anion gap: 13 (ref 5–15)
BUN: 20 mg/dL (ref 6–20)
CO2: 22 mmol/L (ref 22–32)
Calcium: 8.2 mg/dL — ABNORMAL LOW (ref 8.9–10.3)
Chloride: 103 mmol/L (ref 98–111)
Creatinine, Ser: 1.79 mg/dL — ABNORMAL HIGH (ref 0.44–1.00)
GFR, Estimated: 33 mL/min — ABNORMAL LOW (ref >60)
Glucose, Bld: 159 mg/dL — ABNORMAL HIGH (ref 70–99)
Potassium: 3 mmol/L — ABNORMAL LOW (ref 3.5–5.1)
Sodium: 138 mmol/L (ref 135–145)
Total Bilirubin: 0.6 mg/dL (ref 0.3–1.2)
Total Protein: 8.1 g/dL (ref 6.5–8.1)

## 2020-11-21 LAB — RETICULOCYTES
Immature Retic Fract: 26.8 % — ABNORMAL HIGH (ref 2.3–15.9)
RBC.: 3.27 MIL/uL — ABNORMAL LOW (ref 3.87–5.11)
Retic Count, Absolute: 97.4 10*3/uL (ref 19.0–186.0)
Retic Ct Pct: 3 % (ref 0.4–3.1)

## 2020-11-21 LAB — FOLATE: Folate: 8.1 ng/mL (ref >5.9)

## 2020-11-21 LAB — PHOSPHORUS: Phosphorus: 3.7 mg/dL (ref 2.5–4.6)

## 2020-11-21 LAB — VITAMIN B12: Vitamin B-12: 180 pg/mL (ref 180–914)

## 2020-11-21 LAB — HEMOGLOBIN A1C
Hgb A1c MFr Bld: 7.4 % — ABNORMAL HIGH (ref 4.8–5.6)
Mean Plasma Glucose: 165.68 mg/dL

## 2020-11-21 LAB — FERRITIN: Ferritin: 156 ng/mL (ref 11–307)

## 2020-11-21 LAB — TSH: TSH: 1.538 u[IU]/mL (ref 0.350–4.500)

## 2020-11-21 LAB — MAGNESIUM: Magnesium: 1.4 mg/dL — ABNORMAL LOW (ref 1.7–2.4)

## 2020-11-21 MED ORDER — METOCLOPRAMIDE HCL 5 MG/ML IJ SOLN: 10.0000 mg | Freq: Three times a day (TID) | INTRAMUSCULAR | Status: DC

## 2020-11-21 MED ORDER — MORPHINE SULFATE 15 MG PO TABS
15.0000 mg | ORAL_TABLET | ORAL | Status: DC | PRN
  Administered 2020-11-21 – 2020-11-23 (×7): 15 mg via ORAL
  Filled 2020-11-21 (×7): qty 1

## 2020-11-21 MED ORDER — DOCUSATE SODIUM 100 MG PO CAPS
100.0000 mg | ORAL_CAPSULE | Freq: Two times a day (BID) | ORAL | Status: DC | PRN
  Administered 2020-11-22 – 2020-11-23 (×2): 100 mg via ORAL
  Filled 2020-11-21 (×2): qty 1

## 2020-11-21 MED ORDER — HYDROMORPHONE HCL 2 MG PO TABS
2.0000 mg | ORAL_TABLET | ORAL | Status: DC | PRN
Start: 2020-11-21 — End: 2020-11-21

## 2020-11-21 MED ORDER — HYDROMORPHONE HCL 2 MG PO TABS: 2.0000 mg | ORAL_TABLET | Freq: Four times a day (QID) | ORAL | Status: DC | PRN

## 2020-11-21 MED ORDER — HYDROMORPHONE HCL 2 MG PO TABS: 4.0000 mg | ORAL_TABLET | ORAL | Status: DC | PRN

## 2020-11-21 MED ORDER — ONDANSETRON 4 MG PO TBDP
4.0000 mg | ORAL_TABLET | Freq: Three times a day (TID) | ORAL | Status: DC | PRN
  Administered 2020-11-22: 4 mg via ORAL
  Filled 2020-11-21: qty 1

## 2020-11-21 MED ORDER — SODIUM CHLORIDE 0.9 % IV SOLN
75.0000 mL/h | INTRAVENOUS | Status: AC
  Administered 2020-11-21: 75 mL/h via INTRAVENOUS

## 2020-11-21 MED ORDER — MAGNESIUM SULFATE IN D5W 1-5 GM/100ML-% IV SOLN
1.0000 g | Freq: Once | INTRAVENOUS | Status: AC
  Administered 2020-11-21: 1 g via INTRAVENOUS
  Filled 2020-11-21: qty 100

## 2020-11-21 MED ORDER — INSULIN ASPART 100 UNIT/ML IJ SOLN
0.0000 [IU] | Freq: Three times a day (TID) | INTRAMUSCULAR | Status: DC
  Administered 2020-11-22: 5 [IU] via SUBCUTANEOUS
  Administered 2020-11-22 (×2): 8 [IU] via SUBCUTANEOUS
  Administered 2020-11-23 (×2): 5 [IU] via SUBCUTANEOUS
  Administered 2020-11-23: 8 [IU] via SUBCUTANEOUS
  Administered 2020-11-24: 5 [IU] via SUBCUTANEOUS
  Administered 2020-11-24: 3 [IU] via SUBCUTANEOUS

## 2020-11-21 MED ORDER — INSULIN ASPART 100 UNIT/ML IJ SOLN
4.0000 [IU] | Freq: Three times a day (TID) | INTRAMUSCULAR | Status: DC
  Administered 2020-11-22 (×3): 4 [IU] via SUBCUTANEOUS

## 2020-11-21 MED ORDER — ONDANSETRON HCL 4 MG/2ML IJ SOLN
4.0000 mg | Freq: Four times a day (QID) | INTRAMUSCULAR | Status: DC | PRN
  Administered 2020-11-21: 4 mg via INTRAVENOUS
  Filled 2020-11-21: qty 2

## 2020-11-21 MED ORDER — POTASSIUM CHLORIDE CRYS ER 20 MEQ PO TBCR
40.0000 meq | EXTENDED_RELEASE_TABLET | ORAL | Status: AC
  Administered 2020-11-21 (×2): 40 meq via ORAL
  Filled 2020-11-21 (×2): qty 2

## 2020-11-21 MED ORDER — POTASSIUM CHLORIDE 10 MEQ/100ML IV SOLN
10.0000 meq | INTRAVENOUS | Status: AC
Start: 2020-11-21 — End: 2020-11-21
  Administered 2020-11-21 (×2): 10 meq via INTRAVENOUS
  Filled 2020-11-21 (×2): qty 100

## 2020-11-21 MED ORDER — INSULIN ASPART 100 UNIT/ML IJ SOLN
0.0000 [IU] | Freq: Every day | INTRAMUSCULAR | Status: DC
  Administered 2020-11-21: 4 [IU] via SUBCUTANEOUS
  Administered 2020-11-23: 3 [IU] via SUBCUTANEOUS

## 2020-11-21 MED ORDER — POLYETHYLENE GLYCOL 3350 17 G PO PACK: 17.0000 g | PACK | Freq: Two times a day (BID) | ORAL | Status: DC | PRN

## 2020-11-21 MED ORDER — MAGNESIUM SULFATE 4 GM/100ML IV SOLN
4.0000 g | Freq: Once | INTRAVENOUS | Status: AC
  Administered 2020-11-21: 4 g via INTRAVENOUS
  Filled 2020-11-21: qty 100

## 2020-11-21 NOTE — Progress Notes (Signed)
  Echocardiogram 2D Echocardiogram has been performed.  Deborah [name] 11/21/2020, 11:51 AM

## 2020-11-21 NOTE — Progress Notes (Signed)
OT Cancellation Note  Patient Details Name: Deborah [name] MRN: [id number] DOB: 12/01/1965   Cancelled Treatment:    Reason Eval/Treat Not Completed: OT screened, no needs identified, will sign off. Patient sitting up on side of bed when therapist entered room. Reports she has been transferring to [facility] with nursing staff. She only transfers at baseline and uses a wheelchair at home. Has an aide as well. Reports not OT needs. OT will sign off.  Shaquon Gropp L Caasi Giglia 11/21/2020, 10:36 AM

## 2020-11-21 NOTE — Progress Notes (Signed)
PT Cancellation Note  Patient Details Name: Deborah [name] MRN: [id number] DOB: 28-Jan-1966   Cancelled Treatment:    Reason Eval/Treat Not Completed: Patient declined, no reason specified (pt declined mobility reporting she is having too much abdominal pain. PT will follow up as schedule allows.)   Festus Barren., PT, DPT  Acute Rehabilitation Services  Office [phone]  11/21/2020, 9:26 AM

## 2020-11-21 NOTE — Progress Notes (Signed)
PROGRESS NOTE    Deborah [name]  ZOX:[id number] DOB: 1965/10/14 DOA: 11/20/2020 PCP: Cena Benton, MD  1409/1409-01   Assessment & Plan:   Active Problems:   Essential hypertension, benign   Uncontrolled type 2 diabetes mellitus with diabetic neuropathy, with long-term current use of insulin (HCC)   Chronic kidney disease, stage 3b (HCC)   Chest pain   Hypokalemia   Hypomagnesemia   Normocytic anemia   Diabetic gastroparesis (HCC)   GERD (gastroesophageal reflux disease)   Morbid obesity (HCC)   Leukocytosis   Abdominal pain   Intractable nausea and vomiting   Chronic systolic CHF (congestive heart failure) ([place])   Uncontrolled type 2 diabetes mellitus with hyperglycemia (HCC)   Nonalcoholic fatty liver disease   Malignant carcinoid tumor of duodenum (HCC)   Chronic pain   PAF (paroxysmal atrial fibrillation) (HCC)   Prolonged QT interval   Acute renal failure superimposed on stage 3b chronic kidney disease (HCC)   Nausea and vomiting   Elevated troponin   Deborah [name] is a 56 y.o. female with medical history significant of DM2, gastroparesis, chornic pain syndrome, a.fib on eliquis and and CHF, CVA, cardiac arrest, frequent admissions for N/V and abdominal pain who presented with 3 days of nausea vomiting and stabbing abdominal pain.   N/V and abdominal pain  Frequent admissions Drug-seeking behavior --21 hospitalizations in the past 2 years.  Many documentations of pt demanding IV dilaudid and nothing else, including current hospitalization.  14 CT a/p in the past 2 years, including 3 in just the past month, all unremarkable.  Pt has been followed by GI and had EGD recently in May 2022 which was unremarkable.  Since no discernable etiology was found for pt's N/V and abdominal pain, pt had mainly been treated as gastroparesis in the past. --the day after admission pt said she wanted diet to be graded from clear liquid to regular, stating that she  could eat without pain, however, at the same time insisting on IV dilaudid for pain.  When oral dilaudid was offered (pt has current Rx), she said she doesn't take oral dilaudid. Plan: --upgrade diet from clear liquid to diabetic low fat --d/c IV dilaudid.  No IV opioids. --oral morphine 15 mg q4h PRN  Hx of gastroparesis --Per GI consult note on 01/12/20, "Diabetic gastroparesis that will not improve as long as she remains on narcotic pain meds."  "Would not retry Reglan due to history of tremors and Reglan also unlikely to help as long as she is on Dilaudid. Other prokinetics unlikely to help as long as she is on the Dilaudid. If she continues to have N/V then change to NPO."  Possible AKI on CKD3  --CKD stage can't be further quantified because Cr has been all over the place. --Cr 2.14 on presentation, improved to 1.72 with IVF.  Cr was 1.16 on 11/07/20.  Met criteria for AKI, however, Cr has been all over the place, and 1.16 may be an outlier.   Plan: --cont MIVF_0    Hypokalemia - --monitor and replete with oral potassium    Uncontrolled type 2 diabetes mellitus with hyperglycemia (HCC) -     - continue home insulin regimen     Lantus 50 units, --add mealtime 4u TID --change SSI to mod scale    PAF on Eliquis continue Eliquis --cont Toprol    Nonalcoholic fatty liver disease  Chronic stable    Morbid obesity, BMI 45.87 would benefit from outpatient follow-up  GERD (gastroesophageal reflux disease) --cont PI    Essential hypertension --cont Toprol and Imdur --hold torsemide and spironolactone    Chronic systolic CHF (congestive heart failure) ([place])   -- currently appears to be slightly on the dry side --hold torsemide and spironolactone 2/2 AKI   Chest pain Chronic mild troponin elevation --trop 52, and flat.  No EKG changes suggestive of ischemia.   --cardiology consulted on admission Plan: --Echo today, no wall motion abnormality --formal cardio rec pending      Normocytic anemia  Vit B12 def --anemia workup showed Vit B12 def at 180  Hx of duodenal carcinoid tumor  --removed     Leukocytosis, chronic Chronic stable    Hypomagnesemia  --monitor and replete with IV   Chronic pain on chronic opioids --pt has Rx for oral dilaudid for the past year, last filled on 11/04/20, however, now stated that she doesn't take them anymore. Plan: --order oral morphine instead.   DVT prophylaxis: IF:OYDXAJO Code Status: Full code  Family Communication:  Level of care: Progressive Dispo:   The patient is from: home Anticipated d/c is to: home Anticipated d/c date is: likely tomorrow Patient currently is not medically ready to d/c due to: cardiology consult pending   Subjective and Interval History:  Denied N/V.  Pt said her abdominal pain improved and wanted to upgrade from clear liquid diet.  Then pt asked for IV dilaudid.  Explained to pt that she could have oral dilaudid (pt has outpatient Rx for this), but pt said she doesn't take oral dilaudid.   Objective: Vitals:   11/21/20 0414 11/21/20 0843 11/21/20 1259 11/21/20 1350  BP: 113/85 133/73 (!) 93/56 106/70  Pulse: (!) 109 91 84   Resp: _0 Temp: (!) 97.4 F (36.3 C) 97.8 F (36.6 C) (!) 97.5 F (36.4 C)   TempSrc: Oral Oral Oral   SpO2: 99% 98% 100%   Weight:      Height:        Intake/Output Summary (Last 24 hours) at 11/21/2020 1700 Last data filed at 11/21/2020 1400 Gross per 24 hour  Intake 1771.91 ml  Output --  Net 1771.91 ml   Filed Weights   11/20/20 1217 11/20/20 2306  Weight: 127 kg 128.9 kg    Examination:   Constitutional: NAD, AAOx3 HEENT: conjunctivae and lids normal, EOMI CV: No cyanosis.   RESP: normal respiratory effort, on RA Neuro: II - XII grossly intact.     Data Reviewed: I have personally reviewed following labs and imaging studies  CBC: Recent Labs  Lab 11/20/20 1233 11/21/20 0011  WBC 16.0* 16.9*  NEUTROABS  --  12.0*  HGB  9.8* 9.6*  HCT 31.1* 30.7*  MCV 88.4 89.0  PLT 459* 878*   Basic Metabolic Panel: Recent Labs  Lab 11/20/20 1233 11/20/20 2315 11/21/20 0011  NA 138 138 138  K 2.4* 3.2* 3.0*  CL 104 103 103  CO2 19* 22 22  GLUCOSE 103* 162* 159*  BUN 23* 21* 20  CREATININE 2.14* 1.72* 1.79*  CALCIUM 8.9 8.2* 8.2*  MG 1.1*  --  1.4*  PHOS  --   --  3.7   GFR: Estimated Creatinine Clearance: 48.3 mL/min (A) (by C-G formula based on SCr of 1.79 mg/dL (H)). Liver Function Tests: Recent Labs  Lab 11/20/20 1233 11/21/20 0011  AST 20 17  ALT 14 12  ALKPHOS 102 99  BILITOT 0.6 0.6  PROT 8.6* 8.1  ALBUMIN 3.7 3.4*  Recent Labs  Lab 11/20/20 1233  LIPASE 22   No results for input(s): AMMONIA in the last 168 hours. Coagulation Profile: No results for input(s): INR, PROTIME in the last 168 hours. Cardiac Enzymes: No results for input(s): CKTOTAL, CKMB, CKMBINDEX, TROPONINI in the last 168 hours. BNP (last 3 results) No results for input(s): PROBNP in the last 8760 hours. HbA1C: Recent Labs    11/20/20 2315  HGBA1C 7.4*   CBG: Recent Labs  Lab 11/21/20 0003 11/21/20 0411 11/21/20 0739 11/21/20 1148 11/21/20 1620  GLUCAP 156* 168* 199* 267* 371*   Lipid Profile: No results for input(s): CHOL, HDL, LDLCALC, TRIG, CHOLHDL, LDLDIRECT in the last 72 hours. Thyroid Function Tests: Recent Labs    11/21/20 0011  TSH 1.538   Anemia Panel: Recent Labs    11/21/20 0420  VITAMINB12 180  FOLATE 8.1  FERRITIN 156  TIBC 370  IRON 73  RETICCTPCT 3.0   Sepsis Labs: Recent Labs  Lab 11/20/20 1411 11/20/20 1652  LATICACIDVEN 2.4* 1.8    Recent Results (from the past 240 hour(s))  Resp Panel by RT-PCR (Flu A&B, Covid) Nasopharyngeal Swab     Status: None   Collection Time: 11/20/20  7:00 PM   Specimen: Nasopharyngeal Swab; Nasopharyngeal(NP) swabs in vial transport medium  Result Value Ref Range Status   SARS Coronavirus 2 by RT PCR NEGATIVE NEGATIVE Final    Comment:  (NOTE) SARS-CoV-2 target nucleic acids are NOT DETECTED.  The SARS-CoV-2 RNA is generally detectable in upper respiratory specimens during the acute phase of infection. The lowest concentration of SARS-CoV-2 viral copies this assay can detect is 138 copies/mL. A negative result does not preclude SARS-Cov-2 infection and should not be used as the sole basis for treatment or other patient management decisions. A negative result may occur with  improper specimen collection/handling, submission of specimen other than nasopharyngeal swab, presence of viral mutation(s) within the areas targeted by this assay, and inadequate number of viral copies(<138 copies/mL). A negative result must be combined with clinical observations, patient history, and epidemiological information. The expected result is Negative.  Fact Sheet for Patients:  EntrepreneurPulse.com.au  Fact Sheet for Healthcare Providers:  IncredibleEmployment.be  This test is no t yet approved or cleared by the [place] FDA and  has been authorized for detection and/or diagnosis of SARS-CoV-2 by FDA under an Emergency Use Authorization (EUA). This EUA will remain  in effect (meaning this test can be used) for the duration of the COVID-19 declaration under Section 564(b)(1) of the Act, 21 U.S.C.section 360bbb-3(b)(1), unless the authorization is terminated  or revoked sooner.       Influenza A by PCR NEGATIVE NEGATIVE Final   Influenza B by PCR NEGATIVE NEGATIVE Final    Comment: (NOTE) The Xpert Xpress SARS-CoV-2/FLU/RSV plus assay is intended as an aid in the diagnosis of influenza from Nasopharyngeal swab specimens and should not be used as a sole basis for treatment. Nasal washings and aspirates are unacceptable for Xpert Xpress SARS-CoV-2/FLU/RSV testing.  Fact Sheet for Patients: EntrepreneurPulse.com.au  Fact Sheet for Healthcare  Providers: IncredibleEmployment.be  This test is not yet approved or cleared by the [place] FDA and has been authorized for detection and/or diagnosis of SARS-CoV-2 by FDA under an Emergency Use Authorization (EUA). This EUA will remain in effect (meaning this test can be used) for the duration of the COVID-19 declaration under Section 564(b)(1) of the Act, 21 U.S.C. section 360bbb-3(b)(1), unless the authorization is terminated or revoked.  Performed at  [facility], [place] [address], [place], [place] [address]       Radiology Studies: CT ABDOMEN PELVIS WO CONTRAST  Result Date: 11/20/2020 CLINICAL DATA:  Epigastric pain EXAM: CT ABDOMEN AND PELVIS WITHOUT CONTRAST TECHNIQUE: Multidetector CT imaging of the abdomen and pelvis was performed following the standard protocol without IV contrast. COMPARISON:  CT abdomen/pelvis 11/07/2020 FINDINGS: Lower chest: The lung bases are clear. The imaged heart is unremarkable. Hepatobiliary: The liver is unremarkable. The gallbladder is surgically absent. There is no biliary ductal dilatation. Pancreas: Unremarkable. Spleen: Unremarkable Adrenals/Urinary Tract: The adrenals are unremarkable. The kidneys are normal, with no focal lesion, stone, hydronephrosis, or hydroureter. The bladder is unremarkable. Stomach/Bowel: The stomach is unremarkable. There is no evidence of bowel obstruction. There is no abnormal bowel wall thickening or inflammatory change. The appendix is normal. Vascular/Lymphatic: The abdominal aorta is nonaneurysmal with minimal calcified atherosclerotic plaque. There is no abdominal or pelvic lymphadenopathy. Reproductive: There is a 4.9 cm soft tissue density lesion in the left adnexa, unchanged going back multiple studies. There is no new adnexal lesion. Other: There is no ascites or free air. Musculoskeletal: There are degenerative changes at L5-S1 with bulky anterior osteophytes, unchanged.  IMPRESSION: No acute findings in the abdomen or pelvis. No change since 11/07/2020. Aortic Atherosclerosis (ICD10-I70.0). Electronically Signed   By: Valetta Mole M.D.   On: 11/20/2020 16:19   ECHOCARDIOGRAM COMPLETE  Result Date: 11/21/2020    ECHOCARDIOGRAM REPORT   Patient Name:   MADALIN HUGHART Date of Exam: 11/21/2020 Medical Rec #:  [id number]            Height:       66.0 in Accession #:    [id number]           Weight:       284.2 lb Date of Birth:  1965/07/28            BSA:          2.322 m Patient Age:    11 years             BP:           113/85 mmHg Patient Gender: F                    HR:           90 bpm. Exam Location:  Inpatient Procedure: 2D Echo, Cardiac Doppler, Color Doppler and Strain Analysis Indications:    Elevated troponin. ; R07.9* Chest pain, unspecified  History:        Patient has prior history of Echocardiogram examinations, most                 recent 07/23/2019. CHF, Abnormal ECG, COPD, Arrythmias:Cardiac                 Arrest and Atrial Fibrillation, Signs/Symptoms:Dyspnea,                 [name] and Chest Pain; Risk Factors:Hypertension                 and Diabetes. Hypoxia. Kidney disease.  Sonographer:    Roseanna Rainbow RDCS Referring Phys: [place]  Sonographer Comments: Technically difficult study due to poor echo windows and patient is morbidly obese. Image acquisition challenging due to patient body habitus. Global longitudinal strain was attempted. IMPRESSIONS  1. Left ventricular ejection fraction, by estimation, is 50 to 55%. The left ventricle has low  normal function. The left ventricle has no regional wall motion abnormalities. There is severe concentric left ventricular hypertrophy. Left ventricular diastolic parameters are indeterminate. Elevated left atrial pressure.  2. Right ventricular systolic function is normal. The right ventricular size is normal. Tricuspid regurgitation signal is inadequate for assessing PA pressure.  3. The  mitral valve is normal in structure. No evidence of mitral valve regurgitation.  4. The aortic valve is tricuspid. Aortic valve regurgitation is not visualized. No aortic stenosis is present.  5. The inferior vena cava is normal in size with greater than 50% respiratory variability, suggesting right atrial pressure of 3 mmHg. Comparison(s): A prior study was performed on 07/23/2019. Similar to prior. FINDINGS  Left Ventricle: Left ventricular ejection fraction, by estimation, is 50 to 55%. The left ventricle has low normal function. The left ventricle has no regional wall motion abnormalities. Global longitudinal strain performed but not reported based on interpreter judgement due to suboptimal tracking. The left ventricular internal cavity size was normal in size. There is severe concentric left ventricular hypertrophy. Left ventricular diastolic parameters are indeterminate. Elevated left atrial pressure. Right Ventricle: The right ventricular size is normal. No increase in right ventricular wall thickness. Right ventricular systolic function is normal. Tricuspid regurgitation signal is inadequate for assessing PA pressure. Left Atrium: Left atrial size was normal in size. Right Atrium: Right atrial size was normal in size. Pericardium: There is no evidence of pericardial effusion. Mitral Valve: The mitral valve is normal in structure. No evidence of mitral valve regurgitation. Tricuspid Valve: The tricuspid valve is grossly normal. Tricuspid valve regurgitation is not demonstrated. Aortic Valve: The aortic valve is tricuspid. Aortic valve regurgitation is not visualized. No aortic stenosis is present. Pulmonic Valve: The pulmonic valve was not well visualized. Pulmonic valve regurgitation is not visualized. No evidence of pulmonic stenosis. Aorta: The aortic root and ascending aorta are structurally normal, with no evidence of dilitation. Venous: The inferior vena cava is normal in size with greater than 50%  respiratory variability, suggesting right atrial pressure of 3 mmHg. IAS/Shunts: The atrial septum is grossly normal.  LEFT VENTRICLE PLAX 2D LVIDd:         4.10 cm     Diastology LVIDs:         3.10 cm     LV e' medial:    4.03 cm/s LV PW:         2.00 cm     LV E/e' medial:  18.7 LV IVS:        1.40 cm     LV e' lateral:   4.57 cm/s LVOT diam:     2.20 cm     LV E/e' lateral: 16.5 LV SV:         65 LV SV Index:   28 LVOT Area:     3.80 cm  LV Volumes (MOD) LV vol d, MOD A2C: 74.5 ml LV vol d, MOD A4C: 90.0 ml LV vol s, MOD A2C: 38.9 ml LV vol s, MOD A4C: 45.3 ml LV SV MOD A2C:     35.6 ml LV SV MOD A4C:     90.0 ml LV SV MOD BP:      41.7 ml IVC IVC diam: 1.20 cm LEFT ATRIUM             Index       RIGHT ATRIUM           Index LA diam:        3.30 cm  1.42 cm/m  RA Area:     12.70 cm LA Vol (A2C):   58.2 ml 25.07 ml/m RA Volume:   23.00 ml  9.91 ml/m LA Vol (A4C):   39.7 ml 17.10 ml/m LA Biplane Vol: 48.1 ml 20.72 ml/m  AORTIC VALVE LVOT Vmax:   101.00 cm/s LVOT Vmean:  65.100 cm/s LVOT VTI:    0.171 m  AORTA Ao Root diam: 3.50 cm Ao Asc diam:  3.40 cm MITRAL VALVE MV Area (PHT): 4.06 cm    SHUNTS MV Decel Time: 187 msec    Systemic VTI:  0.17 m MV E velocity: 75.40 cm/s  Systemic Diam: 2.20 cm MV A velocity: 81.40 cm/s MV E/A ratio:  0.93 Rudean Haskell MD Electronically signed by Rudean Haskell MD Signature Date/Time: 11/21/2020/1:11:55 PM    Final      Scheduled Meds:  apixaban  5 mg Oral BID   atorvastatin  10 mg Oral Daily   insulin aspart  0-9 Units Subcutaneous Q4H   insulin glargine-yfgn  50 Units Subcutaneous QHS   isosorbide mononitrate  60 mg Oral Daily   metoprolol succinate  25 mg Oral Daily   pantoprazole  40 mg Oral BID   Continuous Infusions:  sodium chloride 75 mL/hr (11/21/20 1203)     LOS: 0 days     Enzo Bi, MD [facility] If 7PM-7AM, please contact night-coverage 11/21/2020, 5:00 PM

## 2020-11-21 NOTE — Consult Note (Addendum)
Cardiology Consultation:   Patient ID: Deborah [name] MRN: [id number]; DOB: 19-Dec-1965  Admit date: 11/20/2020 Date of Consult: 11/21/2020  PCP:  Cena Benton, MD   [place] [facility] Cardiologist:  Fransico Him, MD (hospital only, has not followed in office) Click here to update MD or APP on Care Team, Refresh:1}     Patient Profile:   Deborah [name] is a 55 y.o. female with a hx of IDDM, gastroparesis, chronic pain syndrome, recurrent nausea/vomiting, fibromyalgia, PAF, CVA, carcinoid tumor of duodenum s/p resection, osteoarthritis, gastric ulcer, gout, HTN, HLD, obesity, CKD 3b, morbid obesity, chronically elevated troponin, chronic anemia, thrombocytosis by labs, B12 deficiency who is being seen 11/21/2020 for the evaluation of chest pain at the request of Dr. Roel Cluck.  History of Present Illness:   Deborah [name] has a history of frequent readmissions for varying medical issues including gastroparesis, DKA, GI issues, also found to have LV dysfunction, PAF and chest pain in the past. Cardiac arrest is listed in other PMH related to an event in 2019 that occurred 1 minute after IV contrast was given in the past, notes indicate allergy vs vasovagal episode. She had a normal cath at [facility] in 2019 showing normal coronary arteries at which time EF was 45-50%, consistent with NICM. In 2020 she developed paroxysmal atrial fibrillation treated with beta blockers and Eliquis with conversion to NSR. Lexiscan stress test 01/2019 at [facility] was intermediate risk in context of LVEF 36%, with defect felt c/w breast/chest wall artifact. She was last seen by our team in the hospital 06/2019 in the setting of chest pain treated medically at that time. Imdur was added for possible small vessel disease. Prior IM notes also relay concern for DSB in the past. She has a chronically elevated troponin level. She has not returned for office follow-up. Last [facility] echo 07/2019 showed  EF 45-50%, global HK, moderately elevated PASP, mild aortic sclerosis without stenosis. This is her 7th admission to our system since 03/2020. However, she has also had follow-up testing at outside hospitals including [facility] and [place] stress test 05/2020 at [place] was aborted due to chest pain, nausea and vomiting. Notes indicate this presentation was not felt consistent with ACS so no further testing pursued at that time.  Last outside echo 07/2020 showed EF 50-55% with borderline global hypokinesis, mild LAE, normal RV.  Historically she uses a wheelchair so mobility is not good.  She was recently admitted 9/1-11/08/20 with acute on chronic abdominal pain, nausea, and subjective reported vomiting without reproduction of emesis in the hospital. There was no specific etiology identified at that time. Patient requested IV dilaudid during admission.   She returned to the hospital 11/20/2020 with recurrent abdominal pain (all over), nausea and vomiting. She was sleeping when I entered the room. She also had 1 hour of upper chest pain/discomfort prior to coming to the hospital. The chest pain was worse with breathing, not worse with palpation or exertion. She states she feels this once every few months but the abdominal pain is the main issue. No other specific sx reported. Her chest pain resolved with IV dilaudid and she has been chest free since then. She continues to have intermittent abdominal pain. Labs reveal multiple abnormalities including leukocytosis, chronic anemia, initial mild lactic acidosis of 2.4, hypokalemia of 2.4, hypomagnesemia of 1.1, Cr 1.79, hsTroponin trend low/flat at 26-38-52, UDS +opiates only (on oral dilaudid as outpatient).  Past Medical History:  Diagnosis Date   Acute back  pain with sciatica, left    Acute back pain with sciatica, right    AKI (acute kidney injury) ([name])    Anemia, unspecified    Cancer (HCC)    Carcinoid tumor of duodenum    Chest pain with normal  coronary angiography 2019   Chronic kidney disease, stage 3b (HCC)    Chronic pain    Chronic systolic CHF (congestive heart failure) (HCC)    Diabetes mellitus    DKA (diabetic ketoacidosis) (HCC)    Elevated troponin    chronic   Esophageal reflux    Fibromyalgia    Gastric ulcer    Gastroparesis    Gout    Hyperlipidemia    Hypertension    Hypokalemia    Hypomagnesemia    Lumbosacral stenosis    Morbid obesity (HCC)    NICM (nonischemic cardiomyopathy) ([place])    PAF (paroxysmal atrial fibrillation) ([place])    Stroke ([place]) 02/2011   Thrombocytosis    Vitamin B12 deficiency anemia     Past Surgical History:  Procedure Laterality Date   BIOPSY  07/27/2019   Procedure: BIOPSY;  Surgeon: Clarene Essex, MD;  Location: [facility];  Service: Endoscopy;;   BIOPSY  07/30/2019   Procedure: BIOPSY;  Surgeon: Otis Brace, MD;  Location: [facility];  Service: Gastroenterology;;   CATARACT EXTRACTION  01/2014   CHOLECYSTECTOMY     COLONOSCOPY WITH PROPOFOL N/A 07/30/2019   Procedure: COLONOSCOPY WITH PROPOFOL;  Surgeon: Otis Brace, MD;  Location: [facility];  Service: Gastroenterology;  Laterality: N/A;   ESOPHAGOGASTRODUODENOSCOPY N/A 07/27/2019   Procedure: ESOPHAGOGASTRODUODENOSCOPY (EGD);  Surgeon: Clarene Essex, MD;  Location: [facility];  Service: Endoscopy;  Laterality: N/A;   ESOPHAGOGASTRODUODENOSCOPY N/A 07/26/2020   Procedure: ESOPHAGOGASTRODUODENOSCOPY (EGD);  Surgeon: Arta Silence, MD;  Location: [facility];  Service: Endoscopy;  Laterality: N/A;   ESOPHAGOGASTRODUODENOSCOPY (EGD) WITH PROPOFOL N/A 08/02/2019   Procedure: ESOPHAGOGASTRODUODENOSCOPY (EGD) WITH PROPOFOL;  Surgeon: Otis Brace, MD;  Location: [facility];  Service: Gastroenterology;  Laterality: N/A;   HEMOSTASIS CLIP PLACEMENT  08/02/2019   Procedure: HEMOSTASIS CLIP PLACEMENT;  Surgeon: Otis Brace, MD;  Location: [facility];  Service: Gastroenterology;;   POLYPECTOMY  07/30/2019    Procedure: POLYPECTOMY;  Surgeon: Otis Brace, MD;  Location: [facility];  Service: Gastroenterology;;   POLYPECTOMY  08/02/2019   Procedure: POLYPECTOMY;  Surgeon: Otis Brace, MD;  Location: [facility];  Service: Gastroenterology;;     Home Medications:  Prior to Admission medications   Medication Sig Start Date End Date Taking? Authorizing Provider  albuterol (PROVENTIL) (2.5 MG/3ML) 0.083% nebulizer solution Take 3 mLs (2.5 mg total) by nebulization every 6 (six) hours as needed for wheezing or shortness of breath. 04/06/19  Yes Scot Jun, FNP  atorvastatin (LIPITOR) 10 MG tablet TAKE ONE TABLET BY MOUTH ONCE DAILY (AM) Patient taking differently: Take 10 mg by mouth daily. 10/22/20  Yes Alcus Dad, MD  cetirizine (ZYRTEC) 10 MG tablet Take 10 mg by mouth daily. 10/21/20  Yes [provider]  DULoxetine HCl 40 MG CPEP TAKE ONE CAPSULE BY MOUTH TWICE DAILY Patient taking differently: Take 40 mg by mouth in the morning and at bedtime. 10/22/20  Yes Alcus Dad, MD  ELIQUIS 5 MG TABS tablet TAKE 1 TABLET BY MOUTH 2 (TWO) TIMES DAILY. Patient taking differently: Take 5 mg by mouth 2 (two) times daily. 12/20/19  Yes Meccariello, Bernita Raisin, DO  fluticasone (FLONASE) 50 MCG/ACT nasal spray Place 2 sprays into both nostrils daily as needed for  allergies or rhinitis. 12/19/18  Yes Rai, Ripudeep K, MD  hydrALAZINE (APRESOLINE) 50 MG tablet Take 1 tablet (50 mg total) by mouth 3 (three) times daily. 09/10/20  Yes Alcus Dad, MD  HYDROmorphone (DILAUDID) 4 MG tablet Take 1 tablet (4 mg total) by mouth 2 (two) times daily as needed for severe pain. 11/04/20  Yes Raulkar, Clide Deutscher, MD  isosorbide mononitrate (IMDUR) 60 MG 24 hr tablet TAKE 1 TABLET (60 MG TOTAL) BY MOUTH DAILY. 09/10/20  Yes Alcus Dad, MD  LANTUS SOLOSTAR 100 UNIT/ML Solostar Pen Inject 40 Units into the skin daily. Patient taking differently: Inject 50 Units into the skin at bedtime. 10/13/20  11/21/20 Yes Dwyane Dee, MD  melatonin 3 MG TABS tablet TAKE 2 TABLETS (6 MG TOTAL) BY MOUTH AT BEDTIME. 08/11/20  Yes Meccariello, Bernita Raisin, DO  metoprolol succinate (TOPROL-XL) 25 MG 24 hr tablet Take 25 mg by mouth daily. 09/05/20  Yes [provider]  nitroGLYCERIN (NITROSTAT) 0.4 MG SL tablet Place 1 tablet (0.4 mg total) under the tongue every 5 (five) minutes as needed for chest pain. 12/13/18  Yes [name], MD  NOVOLOG FLEXPEN 100 UNIT/ML FlexPen Inject 1-15 Units into the skin in the morning, at noon, in the evening, and at bedtime. CBG 121 - 150: 2 units, CBG 151 - 200: 3 units, CBG 201 - 250: 5 units, CBG 251 - 300: 8 units, CBG 301 - 350: 11 units, CBG 351 - 400: 15 units, CBG > 400 call MD Patient taking differently: Inject 30 Units into the skin in the morning, at noon, in the evening, and at bedtime. 10/13/20  Yes Dwyane Dee, MD  ondansetron (ZOFRAN ODT) 4 MG disintegrating tablet Take 1 tablet (4 mg total) by mouth every 8 (eight) hours as needed for nausea or vomiting. 09/14/20  Yes Fondaw, Wylder S, PA  pantoprazole (PROTONIX) 40 MG tablet TAKE 1 TABLET (40 MG TOTAL) BY MOUTH 2 (TWO) TIMES DAILY. 10/22/20  Yes Alcus Dad, MD  spironolactone (ALDACTONE) 25 MG tablet Take 12.5 mg by mouth daily.   Yes [provider]  tiZANidine (ZANAFLEX) 4 MG tablet Take 4 mg by mouth 3 (three) times daily as needed for muscle spasms. 08/11/20  Yes [provider]  torsemide (DEMADEX) 20 MG tablet Take 2 tablets (40 mg total) by mouth daily. 07/27/20  Yes Shelly Coss, MD  blood glucose meter kit and supplies KIT Dispense based on patient and insurance preference. Use up to four times daily as directed. (FOR ICD-9 250.00, 250.01). 12/13/18   [name], MD  EASY COMFORT PEN NEEDLES 31G X 5 MM MISC USE 3 TIMES A DAY FOR INSULIN ADMINISTRATION 11/14/19   Meccariello, Bernita Raisin, DO  promethazine (PHENERGAN) 12.5 MG tablet Take 1 tablet (12.5 mg total) by mouth every  6 (six) hours as needed for nausea or vomiting. Patient not taking: Reported on 11/21/2020 07/27/20   Shelly Coss, MD    Inpatient Medications: Scheduled Meds:  apixaban  5 mg Oral BID   atorvastatin  10 mg Oral Daily   insulin aspart  0-9 Units Subcutaneous Q4H   insulin glargine-yfgn  50 Units Subcutaneous QHS   isosorbide mononitrate  60 mg Oral Daily   metoCLOPramide (REGLAN) injection  10 mg Intravenous Q8H   metoprolol succinate  25 mg Oral Daily   pantoprazole  40 mg Oral BID   potassium chloride  40 mEq Oral Q4H   Continuous Infusions:  sodium chloride     magnesium sulfate  bolus IVPB 4 g (11/21/20 1010)   PRN Meds: albuterol, docusate sodium, HYDROmorphone, ondansetron (ZOFRAN) IV, ondansetron, polyethylene glycol  Allergies:    Allergies  Allergen Reactions   Diazepam [name]   Gabapentin [name] and Swelling    Other reaction(s): Unknown   Iodinated Diagnostic Agents Anaphylaxis    11/29/17 Cardiac arrest 1 min after IV contrast, possible allergy vs vasovagal episode Iopamidol  Anaphylaxis  High 11/28/2017  Patient had seizure like activity and then code post 100 cc of isovue 300     Isovue [Iopamidol] Anaphylaxis    11/28/17 Patient had seizure like activity and then 1 min code after 100 cc of isovue 300. Possible contrast allergy vs vasovagal episode   Lisinopril Anaphylaxis    Tongue and mouth swelling   Metoclopramide Other (See Comments)    Tardive dyskinesia   Nsaids Anaphylaxis and Other (See Comments)    ULCER   Penicillins Palpitations    Has patient had a PCN reaction causing immediate rash, facial/tongue/throat swelling, SOB or lightheadedness with hypotension: Yes, heart races Has patient had a PCN reaction causing severe rash involving mucus membranes or skin necrosis: No Has patient had a PCN reaction that required hospitalization: Yes  Has patient had a PCN reaction occurring within the last 10 years: No    Tolmetin  Nausea Only and Other (See Comments)    ULCER   Aspirin Other (See Comments)    Irritates stomach ulcer    Cyclobenzaprine     Other reaction(s): Unknown   Rifamycins Other (See Comments)    unknown   Acetaminophen Nausea Only and Other (See Comments)    Irritates stomach ulcer; Abdominal pain   Oxycodone Palpitations   Tramadol Nausea And Vomiting    Social History:   Social History   Socioeconomic History   Marital status: Single    Spouse name: Not on file   Number of children: Not on file   Years of education: Not on file   Highest education level: Not on file  Occupational History   Not on file  Tobacco Use   Smoking status: Never   Smokeless tobacco: Never  Vaping Use   Vaping Use: Never used  Substance and Sexual Activity   Alcohol use: No   Drug use: No   Sexual activity: Not Currently    Birth control/protection: None  Other Topics Concern   Not on file  Social History Narrative   ** Merged History Encounter **       Social Determinants of Health   Financial Resource Strain: Not on file  Food Insecurity: Not on file  Transportation Needs: Not on file  Physical Activity: Not on file  Stress: Not on file  Social Connections: Not on file  Intimate Partner Violence: Not on file    Family History:    Family History  Problem Relation Age of Onset   Diabetes Mother    Diabetes Father    Heart disease Father    Diabetes Sister    Congestive Heart Failure Sister 70   Diabetes Brother      ROS:  Please see the history of present illness.  All other ROS reviewed and negative.     Physical Exam/Data:   Vitals:   11/20/20 2230 11/20/20 2306 11/21/20 0414 11/21/20 0843  BP: (!) 145/79 (!) 141/103 113/85 133/73  Pulse: (!) 103 (!) 110 (!) 109 91  Resp: (!) _0 Temp:   (!) 97.4 F (  36.3 C) 97.8 F (36.6 C)  TempSrc:  Oral Oral Oral  SpO2: 98% 100% 99% 98%  Weight:  128.9 kg    Height:  _0  (1.676 m)      Intake/Output Summary  (Last 24 hours) at 11/21/2020 1033 Last data filed at 11/21/2020 0915 Gross per 24 hour  Intake 1825.66 ml  Output --  Net 1825.66 ml   Last 3 Weights 11/20/2020 11/20/2020 11/07/2020  Weight (lbs) 284 lb 2.8 oz 279 lb 15.8 oz 279 lb 15.8 oz  Weight (kg) 128.9 kg 127 kg 127 kg     Body mass index is 45.87 kg/m.  General: Well developed, well nourished AAF, in no acute distress. Head: Normocephalic, atraumatic, sclera non-icteric, no xanthomas, nares are without discharge. Neck: Negative for carotid bruits. JVP not elevated. Lungs: Clear bilaterally to auscultation without wheezes, rales, or rhonchi. Breathing is unlabored. Heart: Reg rhythm, upper normal rate, S1 S2 without murmurs, rubs, or gallops.  Abdomen: Soft, non-tender, non-distended with normoactive bowel sounds. No rebound/guarding. Extremities: No clubbing or cyanosis. Mild soft BLE sockline edema superimposed on large baseline leg habitus. Distal pedal pulses are 2+ and equal bilaterally. Neuro: Alert and oriented X 3. Moves all extremities spontaneously. Psych:  Responds to questions appropriately with a normal affect.  EKG:  The EKG was personally reviewed and demonstrates:  Difficult to discern - some possible P wave activity noted but seems inconsistent - possible sinus tach vs multifocal atrial tachycardia with occasional ectopy, 128bpm, nonspecific STTW changes Telemetry:  Telemetry was personally reviewed and demonstrates:  initially MAT with occasional PACs, PVCs, then more recently sinus tach/NSR with occasional PVCs   Relevant CV Studies: Cardiac Cath 2019 -[facility]  Discussed with patient (but may still be drowsy).   Angiographically normal coronary arteries.   No significant aortic valve gradient on catheter pullback.   Cath showed angiographically normal arteries. LVEDP 99mHg. EF is 65%.  Wall motion is normal. There is no procedure complication. Loss of blood  is <5 ml. No tissue sample is removed.    2D echo 05/2020 [facility] SUMMARY  There is mild concentric left ventricular hypertrophy.  Left ventricular systolic function is low normal.  There is borderline global hypokinesis of the left ventricle.  LV ejection fraction = 50-55%.  Probably no significant change in comparison with the prior study  noted   Laboratory Data:  High Sensitivity Troponin:   Recent Labs  Lab 11/20/20 1304 11/20/20 1652 11/20/20 2315  TROPONINIHS 26* 38* 52*     Chemistry Recent Labs  Lab 11/20/20 1233 11/20/20 2315 11/21/20 0011  NA 138 138 138  K 2.4* 3.2* 3.0*  CL 104 103 103  CO2 19* 22 22  GLUCOSE 103* 162* 159*  BUN 23* 21* 20  CREATININE 2.14* 1.72* 1.79*  CALCIUM 8.9 8.2* 8.2*  MG 1.1*  --  1.4*  GFRNONAA 27* 34* 33*  ANIONGAP _1 Recent Labs  Lab 11/20/20 1233 11/21/20 0011  PROT 8.6* 8.1  ALBUMIN 3.7 3.4*  AST 20 17  ALT 14 12  ALKPHOS 102 99  BILITOT 0.6 0.6   Lipids No results for input(s): CHOL, TRIG, HDL, LABVLDL, LDLCALC, CHOLHDL in the last 168 hours.  Hematology Recent Labs  Lab 11/20/20 1233 11/21/20 0011 11/21/20 0420  WBC 16.0* 16.9*  --   RBC 3.52* 3.45* 3.27*  HGB 9.8* 9.6*  --   HCT 31.1* 30.7*  --   MCV 88.4 89.0  --   MCH  27.8 27.8  --   MCHC 31.5 31.3  --   RDW 16.4* 16.8*  --   PLT 459* 451*  --    Thyroid  Recent Labs  Lab 11/21/20 0011  TSH 1.538    BNPNo results for input(s): BNP, PROBNP in the last 168 hours.  DDimer No results for input(s): DDIMER in the last 168 hours.   Radiology/Studies:  CT ABDOMEN PELVIS WO CONTRAST  Result Date: 11/20/2020 CLINICAL DATA:  Epigastric pain EXAM: CT ABDOMEN AND PELVIS WITHOUT CONTRAST TECHNIQUE: Multidetector CT imaging of the abdomen and pelvis was performed following the standard protocol without IV contrast. COMPARISON:  CT abdomen/pelvis 11/07/2020 FINDINGS: Lower chest: The lung bases are clear. The imaged heart is unremarkable. Hepatobiliary: The liver is unremarkable.  The gallbladder is surgically absent. There is no biliary ductal dilatation. Pancreas: Unremarkable. Spleen: Unremarkable Adrenals/Urinary Tract: The adrenals are unremarkable. The kidneys are normal, with no focal lesion, stone, hydronephrosis, or hydroureter. The bladder is unremarkable. Stomach/Bowel: The stomach is unremarkable. There is no evidence of bowel obstruction. There is no abnormal bowel wall thickening or inflammatory change. The appendix is normal. Vascular/Lymphatic: The abdominal aorta is nonaneurysmal with minimal calcified atherosclerotic plaque. There is no abdominal or pelvic lymphadenopathy. Reproductive: There is a 4.9 cm soft tissue density lesion in the left adnexa, unchanged going back multiple studies. There is no new adnexal lesion. Other: There is no ascites or free air. Musculoskeletal: There are degenerative changes at L5-S1 with bulky anterior osteophytes, unchanged. IMPRESSION: No acute findings in the abdomen or pelvis. No change since 11/07/2020. Aortic Atherosclerosis (ICD10-I70.0). Electronically Signed   By: Valetta Mole M.D.   On: 11/20/2020 16:19     Assessment and Plan:   1. Abdominal pain, nausea, vomiting, in context of multiple medical issues - CT scan as noted above, also showed left adnexal soft tissue lesion - will defer further management to primary team  2. Chest pain - chronic intermittent issue for patient - troponins not very helpful given chronically elevated, but trend remains low/flat and consistent with prior values here and outside hospitals - EKG without acute ischemic changes - 2D echo planned, await this for evaluation of new WMA - anticipate conservative management but will review with MD - continue metoprolol, Imdur, statin  3. Hypokalemia/hypomagnsemia - potassium 2.4 on admission and magnesium 1.1 - ? Contribution to symptoms - management per primary team  4. H/o NICM - lungs clear, mild edema on exam in context of morbid obesity -  given severe electrolyte disturbances, would not initiate diuresis - continue BB - avoid ACEI/ARB/ARNI/spiro due to CKD and poor OP follow-up  5. PAF - telemetry shows initial probable MAT (looking under ER telemetry tab) with occasional PACs/PVCs, but more recently NSR in the 90s after improvement of electrolytes - continue Eliquis, metoprolol  6. AKI on CKD stage IIIb - Cr appears widely variable over several months time, 1.1-4.3, but last values earlier this month 1.16-1.31, admitted at 2.14 with potassium of 2.4 - further management per primary team   Risk Assessment/Risk Scores:     HEAR Score (for undifferentiated chest pain):  HEAR Score: 4  New York Heart Association (NYHA) Functional Class Wheelchair bound so unable to assess  CHA2DS2-VASc Score = 6   This indicates a 9.7% annual risk of stroke. The patient's score is based upon: CHF History: 1 HTN History: 1 Diabetes History: 1 Stroke History: 2 Vascular Disease History: 0 Age Score: 0 Gender Score: 1  For questions or updates, please contact [place] Please consult [email or web address] for contact info under    Signed, Charlie Pitter, PA-C  11/21/2020 10:33 AM  History and all data above reviewed.  Patient examined.  I agree with the findings as above. The patient presents with a predominant complaint of abdominal pain that progressed to chest pain.  6/10 and sharp.  Now without the chest pain.  She has had diarrhea today.  She gets around with a wheelchair and can stand and pivot.  She has spinal stenosis.  She denies SOB, PND  The patient exam reveals COR:RRR  ,  Lungs: Clear  ,  Abd: Positive bowel sounds, no rebound no guarding, Ext No edema  .  All available labs, radiology testing, previous records reviewed. Agree with documented assessment and plan.   Chest pain:  Non anginal.  The troponin elevation is non specific and not diagnostic of acute coronary syndrome.    EKG was not acute.  No further cardiac work  up in patient.  LVH:  The patient has severe LVH on exam. We can schedule out patient follow up and try to see if she has had previous work up of this  (She has been at [facility] and [facility].  I do not see an MRI or PYP scan.)  Message sent to my staff to try to get [facility] records and to schedule follow up.  QT:  QT is mildly prolonged.  No history of syncope or family history.  Avoid QT prolonging drugs.       Jeneen Rinks Marleta Lapierre  4:03 PM  11/21/2020

## 2020-11-22 DIAGNOSIS — R778 Other specified abnormalities of plasma proteins: Secondary | ICD-10-CM

## 2020-11-22 DIAGNOSIS — R109 Unspecified abdominal pain: Secondary | ICD-10-CM | POA: Diagnosis not present

## 2020-11-22 LAB — BASIC METABOLIC PANEL
Anion gap: 12 (ref 5–15)
BUN: 23 mg/dL — ABNORMAL HIGH (ref 6–20)
CO2: 19 mmol/L — ABNORMAL LOW (ref 22–32)
Calcium: 8.2 mg/dL — ABNORMAL LOW (ref 8.9–10.3)
Chloride: 106 mmol/L (ref 98–111)
Creatinine, Ser: 2.95 mg/dL — ABNORMAL HIGH (ref 0.44–1.00)
GFR, Estimated: 18 mL/min — ABNORMAL LOW (ref >60)
Glucose, Bld: 291 mg/dL — ABNORMAL HIGH (ref 70–99)
Potassium: 3.9 mmol/L (ref 3.5–5.1)
Sodium: 137 mmol/L (ref 135–145)

## 2020-11-22 LAB — GLUCOSE, CAPILLARY
Glucose-Capillary: 187 mg/dL — ABNORMAL HIGH (ref 70–99)
Glucose-Capillary: 227 mg/dL — ABNORMAL HIGH (ref 70–99)
Glucose-Capillary: 270 mg/dL — ABNORMAL HIGH (ref 70–99)
Glucose-Capillary: 273 mg/dL — ABNORMAL HIGH (ref 70–99)
Glucose-Capillary: 274 mg/dL — ABNORMAL HIGH (ref 70–99)
Glucose-Capillary: 274 mg/dL — ABNORMAL HIGH (ref 70–99)

## 2020-11-22 LAB — CBC
HCT: 29.2 % — ABNORMAL LOW (ref 36.0–46.0)
Hemoglobin: 8.9 g/dL — ABNORMAL LOW (ref 12.0–15.0)
MCH: 28.1 pg (ref 26.0–34.0)
MCHC: 30.5 g/dL (ref 30.0–36.0)
MCV: 92.1 fL (ref 80.0–100.0)
Platelets: 386 10*3/uL (ref 150–400)
RBC: 3.17 MIL/uL — ABNORMAL LOW (ref 3.87–5.11)
RDW: 16.7 % — ABNORMAL HIGH (ref 11.5–15.5)
WBC: 13.6 10*3/uL — ABNORMAL HIGH (ref 4.0–10.5)
nRBC: 0 % (ref 0.0–0.2)

## 2020-11-22 LAB — MAGNESIUM: Magnesium: 2.3 mg/dL (ref 1.7–2.4)

## 2020-11-22 MED ORDER — POLYETHYLENE GLYCOL 3350 17 G PO PACK: 34.0000 g | PACK | ORAL | Status: DC

## 2020-11-22 MED ORDER — DOXYLAMINE SUCCINATE (SLEEP) 25 MG PO TABS
25.0000 mg | ORAL_TABLET | Freq: Every evening | ORAL | Status: DC | PRN
  Administered 2020-11-22 – 2020-11-23 (×2): 25 mg via ORAL
  Filled 2020-11-22 (×3): qty 1

## 2020-11-22 MED ORDER — SODIUM CHLORIDE 0.9 % IV SOLN: INTRAVENOUS | Status: DC

## 2020-11-22 MED ORDER — POLYETHYLENE GLYCOL 3350 17 G PO PACK
34.0000 g | PACK | Freq: Two times a day (BID) | ORAL | Status: DC
  Administered 2020-11-22 – 2020-11-23 (×2): 34 g via ORAL
  Filled 2020-11-22 (×4): qty 2

## 2020-11-22 MED ORDER — SODIUM CHLORIDE 0.9 % IV BOLUS
750.0000 mL | Freq: Once | INTRAVENOUS | Status: AC
  Administered 2020-11-22: 750 mL via INTRAVENOUS

## 2020-11-22 NOTE — Progress Notes (Signed)
PROGRESS NOTE    Deborah [name]  SWN:[id number] DOB: 1965-10-21 DOA: 11/20/2020 PCP: Cena Benton, MD  1409/1409-01   Assessment & Plan:   Active Problems:   Essential hypertension, benign   Uncontrolled type 2 diabetes mellitus with diabetic neuropathy, with long-term current use of insulin (HCC)   Chronic kidney disease, stage 3b (HCC)   Chest pain   Hypokalemia   Hypomagnesemia   Normocytic anemia   Diabetic gastroparesis (HCC)   GERD (gastroesophageal reflux disease)   Morbid obesity (HCC)   Leukocytosis   Abdominal pain   Intractable nausea and vomiting   Chronic systolic CHF (congestive heart failure) ([place])   Uncontrolled type 2 diabetes mellitus with hyperglycemia (HCC)   Nonalcoholic fatty liver disease   Malignant carcinoid tumor of duodenum (HCC)   Chronic pain   AKI (acute kidney injury) ([place])   PAF (paroxysmal atrial fibrillation) (HCC)   Prolonged QT interval   Acute renal failure superimposed on stage 3b chronic kidney disease (HCC)   Nausea and vomiting   Elevated troponin   Deborah [name] is a 55 y.o. female with medical history significant of DM2, gastroparesis, chornic pain syndrome, a.fib on eliquis and and CHF, CVA, cardiac arrest, frequent admissions for N/V and abdominal pain who presented with 3 days of nausea vomiting and stabbing abdominal pain.   N/V and abdominal pain  Frequent admissions Drug-seeking behavior --21 hospitalizations in the past 2 years.  Many documentations of pt demanding IV dilaudid and nothing else, including current hospitalization.  14 CT a/p in the past 2 years, including 3 in just the past month, all unremarkable.  Pt has been followed by GI and had EGD recently in May 2022 which was unremarkable.  Since no discernable etiology was found for pt's N/V and abdominal pain, pt had mainly been treated as gastroparesis in the past. --the day after admission pt said she wanted diet to be graded from clear  liquid to regular, stating that she could eat without pain, however, at the same time insisting on IV dilaudid for pain.  When oral dilaudid was offered (pt has current Rx), she said she doesn't take oral dilaudid because it's too strong. --IV dilaudid d/c'ed on 9/16. Plan: --No IV dilaudid.  No IV opioids. --oral morphine 15 mg q4h PRN  Gastroparesis --Per GI consult note on 01/12/20, "Diabetic gastroparesis that will not improve as long as she remains on narcotic pain meds."  "Would not retry Reglan due to history of tremors and Reglan also unlikely to help as long as she is on Dilaudid. Other prokinetics unlikely to help as long as she is on the Dilaudid. If she continues to have N/V then change to NPO."  Possible AKI on CKD3  --CKD stage can't be further quantified because Cr has been all over the place. --Cr 2.14 on presentation, improved to 1.72 with IVF.  Cr was 1.16 on 11/07/20.  Met criteria for AKI, however, Cr has been all over the place, and 1.16 may be an outlier.   --Cr unexpected jumped to 2.95 this morning. Plan: --nephrology consult today   Hypokalemia - --monitor and replete with oral potassium    Uncontrolled type 2 diabetes mellitus with hyperglycemia ([place]) -     - continue home insulin regimen     Lantus 50 units --increase mealtime to 7u TID --SSI mod scale    PAF on Eliquis continue Eliquis --cont Toprol    Nonalcoholic fatty liver disease  Chronic stable  Morbid obesity, BMI 45.87 would benefit from outpatient follow-up    GERD (gastroesophageal reflux disease) --cont PI    Essential hypertension --cont Toprol and Imdur --hold torsemide and spironolactone    Chronic systolic CHF (congestive heart failure) ([place])   -- currently appears to be slightly on the dry side --hold torsemide and spironolactone 2/2 AKI   Chest pain Chronic mild troponin elevation --trop 52, and flat.  No EKG changes suggestive of ischemia.   --cardiology consulted on  admission --Echo, no wall motion abnormality --cardiology consulted, no further workup needed     Normocytic anemia  Vit B12 def --anemia workup showed Vit B12 def at 180 --start vit B12 supplement  Hx of duodenal carcinoid tumor  --removed     Leukocytosis, chronic Chronic stable    Hypomagnesemia  --monitor and replete with IV   Chronic pain on chronic opioids --pt has Rx for oral dilaudid for the past year, last filled on 11/04/20, however, now stated that she doesn't take them anymore because they are too strong. Plan: --cont oral morphine PRN   DVT prophylaxis: [id number] Code Status: Full code  Family Communication:  Level of care: Med-Surg Dispo:   The patient is from: home Anticipated d/c is to: home Anticipated d/c date is: 1-2 days Patient currently is not medically ready to d/c due to: nephro consult pending   Subjective and Interval History:  Pt reported abdominal pain improved with oral morphine.  Now aggress that her abdominal pain is from gastroparesis, but does not want to be on gastroparesis diet.    Cr increased significantly without clear reason.  Nephro consulted today.   Objective: Vitals:   11/22/20 0355 11/22/20 0847 11/22/20 0847 11/22/20 1339  BP: 125/73 120/77 120/77 126/75  Pulse: 98 (!) 103 (!) 103 99  Resp: 16   18  Temp:    98.1 F (36.7 C)  TempSrc:    Oral  SpO2: 100%   100%  Weight:      Height:        Intake/Output Summary (Last 24 hours) at 11/22/2020 1755 Last data filed at 11/22/2020 1300 Gross per 24 hour  Intake 840 ml  Output --  Net 840 ml   Filed Weights   11/20/20 1217 11/20/20 2306  Weight: 127 kg 128.9 kg    Examination:   Constitutional: NAD, AAOx3, sitting in recliner dozing HEENT: conjunctivae and lids normal, EOMI CV: No cyanosis.   RESP: normal respiratory effort, on RA Neuro: II - XII grossly intact.     Data Reviewed: I have personally reviewed following labs and imaging  studies  CBC: Recent Labs  Lab 11/20/20 1233 11/21/20 0011 11/22/20 0522  WBC 16.0* 16.9* 13.6*  NEUTROABS  --  12.0*  --   HGB 9.8* 9.6* 8.9*  HCT 31.1* 30.7* 29.2*  MCV 88.4 89.0 92.1  PLT 459* 451* 852   Basic Metabolic Panel: Recent Labs  Lab 11/20/20 1233 11/20/20 2315 11/21/20 0011 11/22/20 0522  NA 138 138 138 137  K 2.4* 3.2* 3.0* 3.9  CL 104 103 103 106  CO2 19* 22 22 19*  GLUCOSE 103* 162* 159* 291*  BUN 23* 21* 20 23*  CREATININE 2.14* 1.72* 1.79* 2.95*  CALCIUM 8.9 8.2* 8.2* 8.2*  MG 1.1*  --  1.4* 2.3  PHOS  --   --  3.7  --    GFR: Estimated Creatinine Clearance: 29.3 mL/min (A) (by C-G formula based on SCr of 2.95 mg/dL (H)). Liver Function Tests:  Recent Labs  Lab 11/20/20 1233 11/21/20 0011  AST 20 17  ALT 14 12  ALKPHOS 102 99  BILITOT 0.6 0.6  PROT 8.6* 8.1  ALBUMIN 3.7 3.4*   Recent Labs  Lab 11/20/20 1233  LIPASE 22   No results for input(s): AMMONIA in the last 168 hours. Coagulation Profile: No results for input(s): INR, PROTIME in the last 168 hours. Cardiac Enzymes: No results for input(s): CKTOTAL, CKMB, CKMBINDEX, TROPONINI in the last 168 hours. BNP (last 3 results) No results for input(s): PROBNP in the last 8760 hours. HbA1C: Recent Labs    11/20/20 2315  HGBA1C 7.4*   CBG: Recent Labs  Lab 11/21/20 1955 11/22/20 0049 11/22/20 0353 11/22/20 0738 11/22/20 1616  GLUCAP 312* 273* 274* 274* 227*   Lipid Profile: No results for input(s): CHOL, HDL, LDLCALC, TRIG, CHOLHDL, LDLDIRECT in the last 72 hours. Thyroid Function Tests: Recent Labs    11/21/20 0011  TSH 1.538   Anemia Panel: Recent Labs    11/21/20 0420  VITAMINB12 180  FOLATE 8.1  FERRITIN 156  TIBC 370  IRON 73  RETICCTPCT 3.0   Sepsis Labs: Recent Labs  Lab 11/20/20 1411 11/20/20 1652  LATICACIDVEN 2.4* 1.8    Recent Results (from the past 240 hour(s))  Resp Panel by RT-PCR (Flu A&B, Covid) Nasopharyngeal Swab     Status: None    Collection Time: 11/20/20  7:00 PM   Specimen: Nasopharyngeal Swab; Nasopharyngeal(NP) swabs in vial transport medium  Result Value Ref Range Status   SARS Coronavirus 2 by RT PCR NEGATIVE NEGATIVE Final    Comment: (NOTE) SARS-CoV-2 target nucleic acids are NOT DETECTED.  The SARS-CoV-2 RNA is generally detectable in upper respiratory specimens during the acute phase of infection. The lowest concentration of SARS-CoV-2 viral copies this assay can detect is 138 copies/mL. A negative result does not preclude SARS-Cov-2 infection and should not be used as the sole basis for treatment or other patient management decisions. A negative result may occur with  improper specimen collection/handling, submission of specimen other than nasopharyngeal swab, presence of viral mutation(s) within the areas targeted by this assay, and inadequate number of viral copies(<138 copies/mL). A negative result must be combined with clinical observations, patient history, and epidemiological information. The expected result is Negative.  Fact Sheet for Patients:  EntrepreneurPulse.com.au  Fact Sheet for Healthcare Providers:  IncredibleEmployment.be  This test is no t yet approved or cleared by the [place] FDA and  has been authorized for detection and/or diagnosis of SARS-CoV-2 by FDA under an Emergency Use Authorization (EUA). This EUA will remain  in effect (meaning this test can be used) for the duration of the COVID-19 declaration under Section 564(b)(1) of the Act, 21 U.S.C.section 360bbb-3(b)(1), unless the authorization is terminated  or revoked sooner.       Influenza A by PCR NEGATIVE NEGATIVE Final   Influenza B by PCR NEGATIVE NEGATIVE Final    Comment: (NOTE) The Xpert Xpress SARS-CoV-2/FLU/RSV plus assay is intended as an aid in the diagnosis of influenza from Nasopharyngeal swab specimens and should not be used as a sole basis for treatment.  Nasal washings and aspirates are unacceptable for Xpert Xpress SARS-CoV-2/FLU/RSV testing.  Fact Sheet for Patients: EntrepreneurPulse.com.au  Fact Sheet for Healthcare Providers: IncredibleEmployment.be  This test is not yet approved or cleared by the [place] FDA and has been authorized for detection and/or diagnosis of SARS-CoV-2 by FDA under an Emergency Use Authorization (EUA). This EUA will remain in  effect (meaning this test can be used) for the duration of the COVID-19 declaration under Section 564(b)(1) of the Act, 21 U.S.C. section 360bbb-3(b)(1), unless the authorization is terminated or revoked.  Performed at [facility], [place] [address], [place], [place] [address]       Radiology Studies: ECHOCARDIOGRAM COMPLETE  Result Date: 11/21/2020    ECHOCARDIOGRAM REPORT   Patient Name:   Deborah [name] Date of Exam: 11/21/2020 Medical Rec #:  [id number]            Height:       66.0 in Accession #:    [id number]           Weight:       284.2 lb Date of Birth:  04/02/1965            BSA:          2.322 m Patient Age:    39 years             BP:           113/85 mmHg Patient Gender: F                    HR:           90 bpm. Exam Location:  Inpatient Procedure: 2D Echo, Cardiac Doppler, Color Doppler and Strain Analysis Indications:    Elevated troponin. ; R07.9* Chest pain, unspecified  History:        Patient has prior history of Echocardiogram examinations, most                 recent 07/23/2019. CHF, Abnormal ECG, COPD, Arrythmias:Cardiac                 Arrest and Atrial Fibrillation, Signs/Symptoms:Dyspnea,                 [name] and Chest Pain; Risk Factors:Hypertension                 and Diabetes. Hypoxia. Kidney disease.  Sonographer:    Roseanna Rainbow RDCS Referring Phys: [place]  Sonographer Comments: Technically difficult study due to poor echo windows and patient is morbidly obese. Image  acquisition challenging due to patient body habitus. Global longitudinal strain was attempted. IMPRESSIONS  1. Left ventricular ejection fraction, by estimation, is 50 to 55%. The left ventricle has low normal function. The left ventricle has no regional wall motion abnormalities. There is severe concentric left ventricular hypertrophy. Left ventricular diastolic parameters are indeterminate. Elevated left atrial pressure.  2. Right ventricular systolic function is normal. The right ventricular size is normal. Tricuspid regurgitation signal is inadequate for assessing PA pressure.  3. The mitral valve is normal in structure. No evidence of mitral valve regurgitation.  4. The aortic valve is tricuspid. Aortic valve regurgitation is not visualized. No aortic stenosis is present.  5. The inferior vena cava is normal in size with greater than 50% respiratory variability, suggesting right atrial pressure of 3 mmHg. Comparison(s): A prior study was performed on 07/23/2019. Similar to prior. FINDINGS  Left Ventricle: Left ventricular ejection fraction, by estimation, is 50 to 55%. The left ventricle has low normal function. The left ventricle has no regional wall motion abnormalities. Global longitudinal strain performed but not reported based on interpreter judgement due to suboptimal tracking. The left ventricular internal cavity size was normal in size. There is severe concentric left ventricular hypertrophy. Left ventricular diastolic parameters are indeterminate.  Elevated left atrial pressure. Right Ventricle: The right ventricular size is normal. No increase in right ventricular wall thickness. Right ventricular systolic function is normal. Tricuspid regurgitation signal is inadequate for assessing PA pressure. Left Atrium: Left atrial size was normal in size. Right Atrium: Right atrial size was normal in size. Pericardium: There is no evidence of pericardial effusion. Mitral Valve: The mitral valve is normal in  structure. No evidence of mitral valve regurgitation. Tricuspid Valve: The tricuspid valve is grossly normal. Tricuspid valve regurgitation is not demonstrated. Aortic Valve: The aortic valve is tricuspid. Aortic valve regurgitation is not visualized. No aortic stenosis is present. Pulmonic Valve: The pulmonic valve was not well visualized. Pulmonic valve regurgitation is not visualized. No evidence of pulmonic stenosis. Aorta: The aortic root and ascending aorta are structurally normal, with no evidence of dilitation. Venous: The inferior vena cava is normal in size with greater than 50% respiratory variability, suggesting right atrial pressure of 3 mmHg. IAS/Shunts: The atrial septum is grossly normal.  LEFT VENTRICLE PLAX 2D LVIDd:         4.10 cm     Diastology LVIDs:         3.10 cm     LV e' medial:    4.03 cm/s LV PW:         2.00 cm     LV E/e' medial:  18.7 LV IVS:        1.40 cm     LV e' lateral:   4.57 cm/s LVOT diam:     2.20 cm     LV E/e' lateral: 16.5 LV SV:         65 LV SV Index:   28 LVOT Area:     3.80 cm  LV Volumes (MOD) LV vol d, MOD A2C: 74.5 ml LV vol d, MOD A4C: 90.0 ml LV vol s, MOD A2C: 38.9 ml LV vol s, MOD A4C: 45.3 ml LV SV MOD A2C:     35.6 ml LV SV MOD A4C:     90.0 ml LV SV MOD BP:      41.7 ml IVC IVC diam: 1.20 cm LEFT ATRIUM             Index       RIGHT ATRIUM           Index LA diam:        3.30 cm 1.42 cm/m  RA Area:     12.70 cm LA Vol (A2C):   58.2 ml 25.07 ml/m RA Volume:   23.00 ml  9.91 ml/m LA Vol (A4C):   39.7 ml 17.10 ml/m LA Biplane Vol: 48.1 ml 20.72 ml/m  AORTIC VALVE LVOT Vmax:   101.00 cm/s LVOT Vmean:  65.100 cm/s LVOT VTI:    0.171 m  AORTA Ao Root diam: 3.50 cm Ao Asc diam:  3.40 cm MITRAL VALVE MV Area (PHT): 4.06 cm    SHUNTS MV Decel Time: 187 msec    Systemic VTI:  0.17 m MV E velocity: 75.40 cm/s  Systemic Diam: 2.20 cm MV A velocity: 81.40 cm/s MV E/A ratio:  0.93 Rudean Haskell MD Electronically signed by Rudean Haskell MD Signature  Date/Time: 11/21/2020/1:11:55 PM    Final      Scheduled Meds:  apixaban  5 mg Oral BID   atorvastatin  10 mg Oral Daily   insulin aspart  0-15 Units Subcutaneous TID WC   insulin aspart  0-5 Units Subcutaneous QHS   insulin aspart  4 Units Subcutaneous TID WC   insulin glargine-yfgn  50 Units Subcutaneous QHS   isosorbide mononitrate  60 mg Oral Daily   metoprolol succinate  25 mg Oral Daily   pantoprazole  40 mg Oral BID   polyethylene glycol  34 g Oral BID   Continuous Infusions:  sodium chloride     sodium chloride       LOS: 1 day     Enzo Bi, MD [facility] If 7PM-7AM, please contact night-coverage 11/22/2020, 5:55 PM

## 2020-11-22 NOTE — Progress Notes (Signed)
    Consult note reviewed. Echo yesterday shows low normal LVEF 50-55%, unchanged compared to prior. No further cardiac work-up recommended by Dr. Percival Spanish. He is working to get records and will arrange follow-up.  [facility] will sign off.   Medication Recommendations:  none Other recommendations (labs, testing, etc):  none Follow up as an outpatient:  Dr. Ellender Hose, MD, [facility], [place] Director of the [facility] Lipid Disorders &  Cardiovascular [facility] Diplomate of the American Board of Clinical Lipidology Attending Cardiologist  Direct Dial: [phone]  Fax: [phone]  Website:  [email or web address]

## 2020-11-22 NOTE — Progress Notes (Signed)
PT Cancellation Note  Patient Details Name: Deborah [name] MRN: [id number] DOB: 1965/04/08   Cancelled Treatment:    Reason Eval/Treat Not Completed: PT screened, no needs identified, will sign off, patient in recliner, pt. known to this PT, patient is WC dependent at baseline. Patient declines acute PT  needs at this time. Please re-consult PT if any needs arise.    [name] 11/22/2020, 1:52 PM [place] Pager [phone] Office [phone]

## 2020-11-22 NOTE — Progress Notes (Signed)
Pt wants to drink regular coke. Offered diet shasta/coke because of her blood sugar. Pt refuses diet because of allergy. Pt aware her BS might go up, she states she is taking full responsibility.

## 2020-11-22 NOTE — Consult Note (Signed)
Renal Service Consult Note [facility]  Deborah [name] 11/22/2020 Sol Blazing, MD Requesting Physician: Dr. Billie Ruddy, T.   Reason for Consult: Renal failure HPI: The patient is a 55 y.o. year-old w/ hx of spinal stenosis , debility/ WBC bound x 3 yrs, CKD 3b, anemia, DM on insulin, HTN, HL, obesity, NICM, PAF, h/o CVA presented on 9/15 c/o abd pain, N/V and chest pain. Hx drug-seeking behavior. Hx gastroparesis. P was admitted. Has had recent EGD in May this year. Diet was advanced and pt eating w/o pain. Creat labile (2.14 > 1.72 > 2.9 today). Asked to see for renal failure.   Pt seen in room. No n/v and abd pain better. Eating okay. Lives alone, has a nurse, is WC bound x 3 yrs for back issues. F/b Dr Candiss Norse at [facility].      ROS - denies CP, no joint pain, no HA, no blurry vision, no rash, no diarrhea, no nausea/ vomiting, no dysuria, no difficulty voiding   Past Medical History  Past Medical History:  Diagnosis Date   Acute back pain with sciatica, left    Acute back pain with sciatica, right    AKI (acute kidney injury) ([place])    Anemia, unspecified    Cancer (HCC)    Carcinoid tumor of duodenum    Chest pain with normal coronary angiography 2019   Chronic kidney disease, stage 3b (HCC)    Chronic pain    Chronic systolic CHF (congestive heart failure) ([place])    Diabetes mellitus    DKA (diabetic ketoacidosis) ([place])    Drug-seeking behavior    21 hospitalizations and 14 CT a/p in 2 years for N/V and abdominal pain, demanding only IV dilaudid   Elevated troponin    chronic   Esophageal reflux    Fibromyalgia    Gastric ulcer    Gastroparesis    Gout    Hyperlipidemia    Hypertension    Hypokalemia    Hypomagnesemia    Lumbosacral stenosis    Morbid obesity (HCC)    NICM (nonischemic cardiomyopathy) ([place])    PAF (paroxysmal atrial fibrillation) ([place])    Stroke ([place]) 02/2011   Thrombocytosis    Vitamin B12 deficiency anemia    Past Surgical History   Past Surgical History:  Procedure Laterality Date   BIOPSY  07/27/2019   Procedure: BIOPSY;  Surgeon: Clarene Essex, MD;  Location: [facility];  Service: Endoscopy;;   BIOPSY  07/30/2019   Procedure: BIOPSY;  Surgeon: Otis Brace, MD;  Location: [facility];  Service: Gastroenterology;;   CATARACT EXTRACTION  01/2014   CHOLECYSTECTOMY     COLONOSCOPY WITH PROPOFOL N/A 07/30/2019   Procedure: COLONOSCOPY WITH PROPOFOL;  Surgeon: Otis Brace, MD;  Location: [facility];  Service: Gastroenterology;  Laterality: N/A;   ESOPHAGOGASTRODUODENOSCOPY N/A 07/27/2019   Procedure: ESOPHAGOGASTRODUODENOSCOPY (EGD);  Surgeon: Clarene Essex, MD;  Location: [facility];  Service: Endoscopy;  Laterality: N/A;   ESOPHAGOGASTRODUODENOSCOPY N/A 07/26/2020   Procedure: ESOPHAGOGASTRODUODENOSCOPY (EGD);  Surgeon: Arta Silence, MD;  Location: [facility];  Service: Endoscopy;  Laterality: N/A;   ESOPHAGOGASTRODUODENOSCOPY (EGD) WITH PROPOFOL N/A 08/02/2019   Procedure: ESOPHAGOGASTRODUODENOSCOPY (EGD) WITH PROPOFOL;  Surgeon: Otis Brace, MD;  Location: [facility];  Service: Gastroenterology;  Laterality: N/A;   HEMOSTASIS CLIP PLACEMENT  08/02/2019   Procedure: HEMOSTASIS CLIP PLACEMENT;  Surgeon: Otis Brace, MD;  Location: [facility];  Service: Gastroenterology;;   POLYPECTOMY  07/30/2019   Procedure: POLYPECTOMY;  Surgeon: Otis Brace, MD;  Location:  [facility];  Service: Gastroenterology;;   POLYPECTOMY  08/02/2019   Procedure: POLYPECTOMY;  Surgeon: Otis Brace, MD;  Location: [facility];  Service: Gastroenterology;;   Family History  Family History  Problem Relation Age of Onset   Diabetes Mother    Diabetes Father    Heart disease Father    Diabetes Sister    Congestive Heart Failure Sister 62   Diabetes Brother    Social History  reports that she has never smoked. She has never used smokeless tobacco. She reports that she does not drink alcohol and does  not use drugs. Allergies  Allergies  Allergen Reactions   Diazepam [name]   Gabapentin [name] and Swelling    Other reaction(s): Unknown   Iodinated Diagnostic Agents Anaphylaxis    11/29/17 Cardiac arrest 1 min after IV contrast, possible allergy vs vasovagal episode Iopamidol  Anaphylaxis  High 11/28/2017  Patient had seizure like activity and then code post 100 cc of isovue 300     Isovue [Iopamidol] Anaphylaxis    11/28/17 Patient had seizure like activity and then 1 min code after 100 cc of isovue 300. Possible contrast allergy vs vasovagal episode   Lisinopril Anaphylaxis    Tongue and mouth swelling   Metoclopramide Other (See Comments)    Tardive dyskinesia   Nsaids Anaphylaxis and Other (See Comments)    ULCER   Penicillins Palpitations    Has patient had a PCN reaction causing immediate rash, facial/tongue/throat swelling, SOB or lightheadedness with hypotension: Yes, heart races Has patient had a PCN reaction causing severe rash involving mucus membranes or skin necrosis: No Has patient had a PCN reaction that required hospitalization: Yes  Has patient had a PCN reaction occurring within the last 10 years: No    Tolmetin Nausea Only and Other (See Comments)    ULCER   Aspirin Other (See Comments)    Irritates stomach ulcer    Cyclobenzaprine     Other reaction(s): Unknown   Rifamycins Other (See Comments)    unknown   Acetaminophen Nausea Only and Other (See Comments)    Irritates stomach ulcer; Abdominal pain   Oxycodone Palpitations   Tramadol Nausea And Vomiting   Home medications Prior to Admission medications   Medication Sig Start Date End Date Taking? Authorizing Provider  albuterol (PROVENTIL) (2.5 MG/3ML) 0.083% nebulizer solution Take 3 mLs (2.5 mg total) by nebulization every 6 (six) hours as needed for wheezing or shortness of breath. 04/06/19  Yes Scot Jun, FNP  atorvastatin (LIPITOR) 10 MG tablet TAKE ONE TABLET BY  MOUTH ONCE DAILY (AM) Patient taking differently: Take 10 mg by mouth daily. 10/22/20  Yes Alcus Dad, MD  cetirizine (ZYRTEC) 10 MG tablet Take 10 mg by mouth daily. 10/21/20  Yes [provider]  DULoxetine HCl 40 MG CPEP TAKE ONE CAPSULE BY MOUTH TWICE DAILY Patient taking differently: Take 40 mg by mouth in the morning and at bedtime. 10/22/20  Yes Alcus Dad, MD  ELIQUIS 5 MG TABS tablet TAKE 1 TABLET BY MOUTH 2 (TWO) TIMES DAILY. Patient taking differently: Take 5 mg by mouth 2 (two) times daily. 12/20/19  Yes Meccariello, Bernita Raisin, DO  fluticasone (FLONASE) 50 MCG/ACT nasal spray Place 2 sprays into both nostrils daily as needed for allergies or rhinitis. 12/19/18  Yes Rai, Ripudeep K, MD  hydrALAZINE (APRESOLINE) 50 MG tablet Take 1 tablet (50 mg total) by mouth 3 (three) times daily. 09/10/20  Yes Alcus Dad, MD  HYDROmorphone (DILAUDID) 4 MG tablet Take 1 tablet (4 mg total) by mouth 2 (two) times daily as needed for severe pain. 11/04/20  Yes Raulkar, Clide Deutscher, MD  isosorbide mononitrate (IMDUR) 60 MG 24 hr tablet TAKE 1 TABLET (60 MG TOTAL) BY MOUTH DAILY. 09/10/20  Yes Alcus Dad, MD  LANTUS SOLOSTAR 100 UNIT/ML Solostar Pen Inject 40 Units into the skin daily. Patient taking differently: Inject 50 Units into the skin at bedtime. 10/13/20 11/21/20 Yes Dwyane Dee, MD  melatonin 3 MG TABS tablet TAKE 2 TABLETS (6 MG TOTAL) BY MOUTH AT BEDTIME. 08/11/20  Yes Meccariello, Bernita Raisin, DO  metoprolol succinate (TOPROL-XL) 25 MG 24 hr tablet Take 25 mg by mouth daily. 09/05/20  Yes [provider]  nitroGLYCERIN (NITROSTAT) 0.4 MG SL tablet Place 1 tablet (0.4 mg total) under the tongue every 5 (five) minutes as needed for chest pain. 12/13/18  Yes [name], MD  NOVOLOG FLEXPEN 100 UNIT/ML FlexPen Inject 1-15 Units into the skin in the morning, at noon, in the evening, and at bedtime. CBG 121 - 150: 2 units, CBG 151 - 200: 3 units, CBG 201 - 250: 5 units, CBG  251 - 300: 8 units, CBG 301 - 350: 11 units, CBG 351 - 400: 15 units, CBG > 400 call MD Patient taking differently: Inject 30 Units into the skin in the morning, at noon, in the evening, and at bedtime. 10/13/20  Yes Dwyane Dee, MD  ondansetron (ZOFRAN ODT) 4 MG disintegrating tablet Take 1 tablet (4 mg total) by mouth every 8 (eight) hours as needed for nausea or vomiting. 09/14/20  Yes Fondaw, Wylder S, PA  pantoprazole (PROTONIX) 40 MG tablet TAKE 1 TABLET (40 MG TOTAL) BY MOUTH 2 (TWO) TIMES DAILY. 10/22/20  Yes Alcus Dad, MD  spironolactone (ALDACTONE) 25 MG tablet Take 12.5 mg by mouth daily.   Yes [provider]  tiZANidine (ZANAFLEX) 4 MG tablet Take 4 mg by mouth 3 (three) times daily as needed for muscle spasms. 08/11/20  Yes [provider]  torsemide (DEMADEX) 20 MG tablet Take 2 tablets (40 mg total) by mouth daily. 07/27/20  Yes Shelly Coss, MD  blood glucose meter kit and supplies KIT Dispense based on patient and insurance preference. Use up to four times daily as directed. (FOR ICD-9 250.00, 250.01). 12/13/18   [name], MD  EASY COMFORT PEN NEEDLES 31G X 5 MM MISC USE 3 TIMES A DAY FOR INSULIN ADMINISTRATION 11/14/19   Meccariello, Bernita Raisin, DO  promethazine (PHENERGAN) 12.5 MG tablet Take 1 tablet (12.5 mg total) by mouth every 6 (six) hours as needed for nausea or vomiting. Patient not taking: Reported on 11/21/2020 07/27/20   Shelly Coss, MD     Vitals:   11/22/20 0355 11/22/20 0847 11/22/20 0847 11/22/20 1339  BP: 125/73 120/77 120/77 126/75  Pulse: 98 (!) 103 (!) 103 99  Resp: 16   18  Temp:    98.1 F (36.7 C)  TempSrc:    Oral  SpO2: 100%   100%  Weight:      Height:       Exam Gen alert, no distress, obese, sitting up in chair No rash, cyanosis or gangrene Sclera anicteric, throat clear  No jvd or bruits Chest clear bilat to bases, no rales/ wheezing RRR no MRG Abd soft ntnd no mass or ascites +bs GU normal MS no joint  effusions or deformity Ext 1+ ankle edema, no other edema, no wounds or ulcers Neuro  is alert, Ox 3 , nf         Home meds include - lipitor, duloxetine, eliquis, hydralazine, dilaudid po prn, imdur, lantus insulin, metoprolol xl, sl ntg prn, novolog insulin, protonix, aldactone, demadex 40 qd, prns/ vitamins   Echo 9/16 /22-  LFVEF 50-55%, severe LVH, RV syst fxn wnl, no valve disease   ABd CT 9/15 - Adrenals/Urinary Tract: The adrenals are unremarkable. The kidneys are normal, with no focal lesion, stone, hydronephrosis, or hydroureter. The bladder is unremarkable   UA 9/15 - >300 prot, 0-5 rbc/ wbc/ epis     Date    Creat  eGFR   2011- 2016  0.77- 1.20   2017   0.88- 1.59   2018   0.88- 1.69   2019   0.97- 1.75   2020   1.33- 1.86   Jan- mar 2021 1.34- 2.23 June 2019  1.27- 2.70   May    1.07- 2.10   June   1.28- 2.05   Sept 2021  1.40 - 2.35   Nov 2021  1.22- 2.26 25- 52 ml/min  Jan -mar 2022 1.58- 2.10 27- 38 ml/min  May 2022  4.38 >> 2.71   Jun- July  1.64- 2.44 23- 37 ml/min   Aug 2022  2.16 >> 1.12   Sept 1-2, 2022 1.16- 1.31 48- 55 ml/min    Sept 15  2.14, 1.72   Sept 16  1.79    Sept 17  2.95     Assessment/ Plan: AKI on CKD 3b - b/l creat nov 2021 is around 1.2- 2.2, eGFR 25- 52. Not sure why creat bouncing around here. May be lab error. CT shows no hydro, UA is unremarkable. No ACEi/ ARB/ nsaids. BP's are lower today then on admission. May still be volume depleted. Will get urine lytes and start IVF"s w/ NS at 100 cc/hr overnight.  F/u labs in am.   ABd pain / nausea/ vomiting - seems to have resolved Hx chronic pain syndrome IDDM HTN - getting metoprolol xl 25 qd here. Wrote hold orders for SBP < 120.  Obesity Anemia - prob CKD related to some degree +/- other      Kelly Splinter  MD 11/22/2020, 4:24 PM  Recent Labs  Lab 11/21/20 0011 11/22/20 0522  WBC 16.9* 13.6*  HGB 9.6* 8.9*   Recent Labs  Lab 11/21/20 0011 11/22/20 0522  K 3.0* 3.9   BUN 20 23*  CREATININE 1.79* 2.95*  CALCIUM 8.2* 8.2*  PHOS 3.7  --

## 2020-11-23 ENCOUNTER — Inpatient Hospital Stay (HOSPITAL_COMMUNITY): Payer: Medicare Other

## 2020-11-23 ENCOUNTER — Encounter (HOSPITAL_COMMUNITY): Payer: Self-pay | Admitting: Hospitalist

## 2020-11-23 DIAGNOSIS — R109 Unspecified abdominal pain: Secondary | ICD-10-CM | POA: Diagnosis not present

## 2020-11-23 LAB — BASIC METABOLIC PANEL
Anion gap: 7 (ref 5–15)
BUN: 32 mg/dL — ABNORMAL HIGH (ref 6–20)
CO2: 23 mmol/L (ref 22–32)
Calcium: 8 mg/dL — ABNORMAL LOW (ref 8.9–10.3)
Chloride: 104 mmol/L (ref 98–111)
Creatinine, Ser: 2.65 mg/dL — ABNORMAL HIGH (ref 0.44–1.00)
GFR, Estimated: 21 mL/min — ABNORMAL LOW (ref >60)
Glucose, Bld: 255 mg/dL — ABNORMAL HIGH (ref 70–99)
Potassium: 3.7 mmol/L (ref 3.5–5.1)
Sodium: 134 mmol/L — ABNORMAL LOW (ref 135–145)

## 2020-11-23 LAB — CREATININE, URINE, RANDOM: Creatinine, Urine: 358.06 mg/dL

## 2020-11-23 LAB — MAGNESIUM: Magnesium: 2 mg/dL (ref 1.7–2.4)

## 2020-11-23 LAB — GLUCOSE, CAPILLARY
Glucose-Capillary: 209 mg/dL — ABNORMAL HIGH (ref 70–99)
Glucose-Capillary: 210 mg/dL — ABNORMAL HIGH (ref 70–99)
Glucose-Capillary: 214 mg/dL — ABNORMAL HIGH (ref 70–99)
Glucose-Capillary: 218 mg/dL — ABNORMAL HIGH (ref 70–99)
Glucose-Capillary: 243 mg/dL — ABNORMAL HIGH (ref 70–99)
Glucose-Capillary: 251 mg/dL — ABNORMAL HIGH (ref 70–99)
Glucose-Capillary: 261 mg/dL — ABNORMAL HIGH (ref 70–99)

## 2020-11-23 LAB — CBC
HCT: 26.8 % — ABNORMAL LOW (ref 36.0–46.0)
Hemoglobin: 8.3 g/dL — ABNORMAL LOW (ref 12.0–15.0)
MCH: 28.5 pg (ref 26.0–34.0)
MCHC: 31 g/dL (ref 30.0–36.0)
MCV: 92.1 fL (ref 80.0–100.0)
Platelets: 349 10*3/uL (ref 150–400)
RBC: 2.91 MIL/uL — ABNORMAL LOW (ref 3.87–5.11)
RDW: 16.3 % — ABNORMAL HIGH (ref 11.5–15.5)
WBC: 13.3 10*3/uL — ABNORMAL HIGH (ref 4.0–10.5)
nRBC: 0 % (ref 0.0–0.2)

## 2020-11-23 LAB — SODIUM, URINE, RANDOM: Sodium, Ur: 25 mmol/L

## 2020-11-23 MED ORDER — INSULIN ASPART 100 UNIT/ML IJ SOLN
7.0000 [IU] | Freq: Three times a day (TID) | INTRAMUSCULAR | Status: DC
  Administered 2020-11-23: 7 [IU] via SUBCUTANEOUS

## 2020-11-23 MED ORDER — INSULIN ASPART 100 UNIT/ML IJ SOLN
10.0000 [IU] | Freq: Three times a day (TID) | INTRAMUSCULAR | Status: DC
  Administered 2020-11-24 (×2): 10 [IU] via SUBCUTANEOUS

## 2020-11-23 MED ORDER — MORPHINE SULFATE 15 MG PO TABS
15.0000 mg | ORAL_TABLET | Freq: Four times a day (QID) | ORAL | Status: DC | PRN
Start: 2020-11-23 — End: 2020-11-24
  Administered 2020-11-23 – 2020-11-24 (×3): 15 mg via ORAL
  Filled 2020-11-23 (×3): qty 1

## 2020-11-23 MED ORDER — VITAMIN B-12 1000 MCG PO TABS
1000.0000 ug | ORAL_TABLET | Freq: Every day | ORAL | Status: DC
  Administered 2020-11-23 – 2020-11-24 (×2): 1000 ug via ORAL
  Filled 2020-11-23 (×2): qty 1

## 2020-11-23 NOTE — Progress Notes (Addendum)
[facility] Progress Note  Subjective: creat down 2.6 today, having more abd pain today, HR's are up. No SOB , lying flat  Vitals:   11/22/20 2108 11/23/20 0100 11/23/20 0415 11/23/20 0517  BP: 130/77  (!) 150/80 129/76  Pulse: 92  (!) 113 (!) 109  Resp: 20  16   Temp: 98.1 F (36.7 C)  97.6 F (36.4 C)   TempSrc:      SpO2: 100%  100% 99%  Weight:  135 kg    Height:        Exam:  alert, nad , obese  no jvd  Chest cta bilat, lying flat  Cor reg no RG  Abd soft ntnd no ascites   Ext mild ankle edema bilat    Alert, NF, ox3     Home meds include - lipitor, duloxetine, eliquis, hydralazine, dilaudid po prn, imdur, lantus insulin, metoprolol xl, sl ntg prn, novolog insulin, protonix, aldactone, demadex 40 qd, prns/ vitamins    Echo 9/16 /22-  LFVEF 50-55%, severe LVH, RV syst fxn wnl, no valve disease   ABd CT 9/15 - Adrenals/Urinary Tract: The adrenals are unremarkable. The kidneys are normal, with no focal lesion, stone, hydronephrosis, or hydroureter. The bladder is unremarkable   UA 9/15 - >300 prot, 0-5 rbc/ wbc/ epis   UNa 25 , UCr 358      Date                          Creat               eGFR   2011- 2016                0.77- 1.20   2017                          0.88- 1.59   2018                          0.88- 1.69   2019                          0.97- 1.75   2020                          1.33- 1.86   Jan- mar 2021           1.34- 2.23 June 2019                  1.27- 2.70   May                           1.07- 2.10   June                          1.28- 2.05   Sept 2021                  1.40 - 2.35   Nov 2021                   1.22- 2.26        25- 52 ml/min  Jan -mar 2022            1.58- 2.10  27- 38 ml/min  May 2022                   4.38 >> 2.71   Jun- July                    1.64- 2.44        23- 37 ml/min   Aug 2022                   2.16 >> 1.12   Sept 1-2, 2022          1.16- 1.31        48- 55 ml/min     Sept 15                       2.14, 1.72   Sept 16                      1.79    Sept 17                     2.95      Assessment/ Plan: AKI on CKD 3b - b/l creat nov 2021 is around 1.2- 2.2, eGFR 25- 52. Creat here 2.1 on admit , up to 2.9 on 9/17. CT shows no hydro, UA is unremarkable. No ACEi/ ARB/ nsaids. BP's are lower than on admit. Urine lytes show pre-renal; suspect intravasc vol depletion (no evidence of L sided CHF on exam/ CXR). Creat improving w/ IVF's, no signs of vol excess. Will cont IVF's, lower to 75 cc/hr.   ABd pain / nausea/ vomiting - worse today, per pmd Hx chronic pain syndrome IDDM HTN - getting metoprolol xl 25 qd here. Wrote hold orders for SBP < 120.  Obesity Anemia - prob CKD related to some degree +/- other         Rob Domini Vandehei 11/23/2020, 8:25 AM   Recent Labs  Lab 11/21/20 0011 11/22/20 0522 11/23/20 0529  K 3.0* 3.9 3.7  BUN 20 23* 32*  CREATININE 1.79* 2.95* 2.65*  CALCIUM 8.2* 8.2* 8.0*  PHOS 3.7  --   --   HGB 9.6* 8.9* 8.3*   Inpatient medications:  apixaban  5 mg Oral BID   atorvastatin  10 mg Oral Daily   insulin aspart  0-15 Units Subcutaneous TID WC   insulin aspart  0-5 Units Subcutaneous QHS   insulin aspart  7 Units Subcutaneous TID WC   insulin glargine-yfgn  50 Units Subcutaneous QHS   isosorbide mononitrate  60 mg Oral Daily   metoprolol succinate  25 mg Oral Daily   pantoprazole  40 mg Oral BID   polyethylene glycol  34 g Oral BID   vitamin B-12  1,000 mcg Oral Daily    sodium chloride 100 mL/hr at 11/23/20 0124   albuterol, docusate sodium, doxylamine (Sleep), morphine, ondansetron (ZOFRAN) IV, ondansetron

## 2020-11-23 NOTE — Progress Notes (Signed)
PROGRESS NOTE    Deborah [name]  MRA:[id number] DOB: January 25, 1966 DOA: 11/20/2020 PCP: Cena Benton, MD  1409/1409-01   Assessment & Plan:   Active Problems:   Essential hypertension, benign   Uncontrolled type 2 diabetes mellitus with diabetic neuropathy, with long-term current use of insulin (HCC)   Chronic kidney disease, stage 3b (HCC)   Chest pain   Hypokalemia   Hypomagnesemia   Normocytic anemia   Diabetic gastroparesis (HCC)   GERD (gastroesophageal reflux disease)   Morbid obesity (HCC)   Leukocytosis   Abdominal pain   Intractable nausea and vomiting   Chronic systolic CHF (congestive heart failure) ([place])   Uncontrolled type 2 diabetes mellitus with hyperglycemia (HCC)   Nonalcoholic fatty liver disease   Malignant carcinoid tumor of duodenum (HCC)   Chronic pain   AKI (acute kidney injury) ([place])   PAF (paroxysmal atrial fibrillation) (HCC)   Prolonged QT interval   Acute renal failure superimposed on stage 3b chronic kidney disease (HCC)   Nausea and vomiting   Elevated troponin   Deborah [name] is a 55 y.o. female with medical history significant of DM2, gastroparesis, chornic pain syndrome, a.fib on eliquis and and CHF, CVA, cardiac arrest, frequent admissions for N/V and abdominal pain who presented with 3 days of nausea vomiting and stabbing abdominal pain.   N/V and abdominal pain  Frequent admissions Drug-seeking behavior --21 hospitalizations in the past 2 years.  Many documentations of pt demanding IV dilaudid and nothing else, including current hospitalization.  14 CT a/p in the past 2 years, including 3 in just the past month, all unremarkable.  Pt has been followed by GI and had EGD recently in May 2022 which was unremarkable.  Since no discernable etiology was found for pt's N/V and abdominal pain, pt had mainly been treated as gastroparesis in the past. --the day after admission pt said she wanted diet to be graded from clear  liquid to regular, stating that she could eat without pain, however, at the same time insisting on IV dilaudid for pain.  When oral dilaudid was offered (pt has current Rx), she said she doesn't take oral dilaudid because it's too strong. --IV dilaudid d/c'ed on 9/16. Plan: --No IV dilaudid.  No IV opioids. --oral morphine 15 mg q4h PRN  Gastroparesis --Per GI consult note on 01/12/20, "Diabetic gastroparesis that will not improve as long as she remains on narcotic pain meds."  "Would not retry Reglan due to history of tremors and Reglan also unlikely to help as long as she is on Dilaudid. Other prokinetics unlikely to help as long as she is on the Dilaudid. If she continues to have N/V then change to NPO." --pt refused gastroparesis diet.  AKI on CKD3  --CKD stage can't be further quantified because Cr has been all over the place. --Cr 2.14 on presentation, improved to 1.72 with IVF.  Cr was 1.16 on 11/07/20.  Met criteria for AKI, however, Cr has been all over the place, and 1.16 may be an outlier.   --Cr unexpected jumped to 2.95 morning of 9/17.   --nephrology consulted and started pt on MIVF, with improvement in Cr. Plan: --cont MIVF at reduced 75 ml/hr   Hypokalemia - --monitor and replete with oral potassium    Uncontrolled type 2 diabetes mellitus with hyperglycemia (HCC)  Dietary indiscretion --pt insisting on drinking a lot of regular coke. Plan:    - continue home insulin regimen     Lantus 50 units --  increase mealtime to 10u TID --SSI mod scale    PAF on Eliquis continue Eliquis --cont Toprol    Nonalcoholic fatty liver disease  Chronic stable    Morbid obesity, BMI 45.87 would benefit from outpatient follow-up    GERD (gastroesophageal reflux disease) --cont PI    Essential hypertension --cont Toprol and Imdur --hold torsemide and spironolactone    Chronic systolic CHF (congestive heart failure) ([place])   -- currently appears to be slightly on the dry  side --hold torsemide and spironolactone 2/2 AKI   Chest pain Chronic mild troponin elevation --trop 52, and flat.  No EKG changes suggestive of ischemia.   --cardiology consulted on admission --Echo, no wall motion abnormality --cardiology consulted, no further workup needed     Normocytic anemia  Vit B12 def --anemia workup showed Vit B12 def at 180 --cont vit B12 supplement  Hx of duodenal carcinoid tumor  --removed     Leukocytosis, chronic Chronic stable    Hypomagnesemia  --monitor and replete with IV   Chronic pain on chronic opioids --pt has Rx for oral dilaudid for the past year, last filled on 11/04/20, however, now stated that she doesn't take them anymore because they are too strong. Plan: --cont oral morphine PRN --No IV dilaudid or IV pain meds   DVT prophylaxis: XF:GHWEXHB Code Status: Full code  Family Communication:  Level of care: Med-Surg Dispo:   The patient is from: home Anticipated d/c is to: home Anticipated d/c date is: 1-2 days Patient currently is not medically ready to d/c due to: on IVF for AKI, per nephro   Subjective and Interval History:  Pt complained of left knee pain today and swelling.  Complained of increased abdominal pain to nephrologist earlier, but still reported tolerating solid diet.  Has been drinking a lot of sodas.  No BM yet and refused Miralax this morning.   Objective: Vitals:   11/23/20 0100 11/23/20 0415 11/23/20 0517 11/23/20 1238  BP:  (!) 150/80 129/76 139/89  Pulse:  (!) 113 (!) 109 (!) 110  Resp:  16  18  Temp:  97.6 F (36.4 C)  97.7 F (36.5 C)  TempSrc:    Oral  SpO2:  100% 99% 99%  Weight: 135 kg     Height:        Intake/Output Summary (Last 24 hours) at 11/23/2020 1431 Last data filed at 11/23/2020 1228 Gross per 24 hour  Intake 1248.62 ml  Output 350 ml  Net 898.62 ml   Filed Weights   11/20/20 1217 11/20/20 2306 11/23/20 0100  Weight: 127 kg 128.9 kg 135 kg    Examination:    Constitutional: NAD, AAOx3, sitting at side of bed HEENT: conjunctivae and lids normal, EOMI CV: No cyanosis.   RESP: normal respiratory effort, on RA Extremities: No effusions, edema in BLE SKIN: warm, dry Neuro: II - XII grossly intact.   Psych: flat mood and affect.     Data Reviewed: I have personally reviewed following labs and imaging studies  CBC: Recent Labs  Lab 11/20/20 1233 11/21/20 0011 11/22/20 0522 11/23/20 0529  WBC 16.0* 16.9* 13.6* 13.3*  NEUTROABS  --  12.0*  --   --   HGB 9.8* 9.6* 8.9* 8.3*  HCT 31.1* 30.7* 29.2* 26.8*  MCV 88.4 89.0 92.1 92.1  PLT 459* 451* 386 716   Basic Metabolic Panel: Recent Labs  Lab 11/20/20 1233 11/20/20 2315 11/21/20 0011 11/22/20 0522 11/23/20 0529  NA 138 138 138 137 134*  K 2.4* 3.2* 3.0* 3.9 3.7  CL 104 103 103 106 104  CO2 19* 22 22 19* 23  GLUCOSE 103* 162* 159* 291* 255*  BUN 23* 21* 20 23* 32*  CREATININE 2.14* 1.72* 1.79* 2.95* 2.65*  CALCIUM 8.9 8.2* 8.2* 8.2* 8.0*  MG 1.1*  --  1.4* 2.3 2.0  PHOS  --   --  3.7  --   --    GFR: Estimated Creatinine Clearance: 33.5 mL/min (A) (by C-G formula based on SCr of 2.65 mg/dL (H)). Liver Function Tests: Recent Labs  Lab 11/20/20 1233 11/21/20 0011  AST 20 17  ALT 14 12  ALKPHOS 102 99  BILITOT 0.6 0.6  PROT 8.6* 8.1  ALBUMIN 3.7 3.4*   Recent Labs  Lab 11/20/20 1233  LIPASE 22   No results for input(s): AMMONIA in the last 168 hours. Coagulation Profile: No results for input(s): INR, PROTIME in the last 168 hours. Cardiac Enzymes: No results for input(s): CKTOTAL, CKMB, CKMBINDEX, TROPONINI in the last 168 hours. BNP (last 3 results) No results for input(s): PROBNP in the last 8760 hours. HbA1C: Recent Labs    11/20/20 2315  HGBA1C 7.4*   CBG: Recent Labs  Lab 11/22/20 2107 11/23/20 0022 11/23/20 0413 11/23/20 0802 11/23/20 1144  GLUCAP 187* 218* 214* 261* 210*   Lipid Profile: No results for input(s): CHOL, HDL, LDLCALC, TRIG,  CHOLHDL, LDLDIRECT in the last 72 hours. Thyroid Function Tests: Recent Labs    11/21/20 0011  TSH 1.538   Anemia Panel: Recent Labs    11/21/20 0420  VITAMINB12 180  FOLATE 8.1  FERRITIN 156  TIBC 370  IRON 73  RETICCTPCT 3.0   Sepsis Labs: Recent Labs  Lab 11/20/20 1411 11/20/20 1652  LATICACIDVEN 2.4* 1.8    Recent Results (from the past 240 hour(s))  Resp Panel by RT-PCR (Flu A&B, Covid) Nasopharyngeal Swab     Status: None   Collection Time: 11/20/20  7:00 PM   Specimen: Nasopharyngeal Swab; Nasopharyngeal(NP) swabs in vial transport medium  Result Value Ref Range Status   SARS Coronavirus 2 by RT PCR NEGATIVE NEGATIVE Final    Comment: (NOTE) SARS-CoV-2 target nucleic acids are NOT DETECTED.  The SARS-CoV-2 RNA is generally detectable in upper respiratory specimens during the acute phase of infection. The lowest concentration of SARS-CoV-2 viral copies this assay can detect is 138 copies/mL. A negative result does not preclude SARS-Cov-2 infection and should not be used as the sole basis for treatment or other patient management decisions. A negative result may occur with  improper specimen collection/handling, submission of specimen other than nasopharyngeal swab, presence of viral mutation(s) within the areas targeted by this assay, and inadequate number of viral copies(<138 copies/mL). A negative result must be combined with clinical observations, patient history, and epidemiological information. The expected result is Negative.  Fact Sheet for Patients:  EntrepreneurPulse.com.au  Fact Sheet for Healthcare Providers:  IncredibleEmployment.be  This test is no t yet approved or cleared by the [place] FDA and  has been authorized for detection and/or diagnosis of SARS-CoV-2 by FDA under an Emergency Use Authorization (EUA). This EUA will remain  in effect (meaning this test can be used) for the duration of  the COVID-19 declaration under Section 564(b)(1) of the Act, 21 U.S.C.section 360bbb-3(b)(1), unless the authorization is terminated  or revoked sooner.       Influenza A by PCR NEGATIVE NEGATIVE Final   Influenza B by PCR NEGATIVE NEGATIVE Final  Comment: (NOTE) The Xpert Xpress SARS-CoV-2/FLU/RSV plus assay is intended as an aid in the diagnosis of influenza from Nasopharyngeal swab specimens and should not be used as a sole basis for treatment. Nasal washings and aspirates are unacceptable for Xpert Xpress SARS-CoV-2/FLU/RSV testing.  Fact Sheet for Patients: EntrepreneurPulse.com.au  Fact Sheet for Healthcare Providers: IncredibleEmployment.be  This test is not yet approved or cleared by the [place] FDA and has been authorized for detection and/or diagnosis of SARS-CoV-2 by FDA under an Emergency Use Authorization (EUA). This EUA will remain in effect (meaning this test can be used) for the duration of the COVID-19 declaration under Section 564(b)(1) of the Act, 21 U.S.C. section 360bbb-3(b)(1), unless the authorization is terminated or revoked.  Performed at [facility], [place] [address], [place], [place] [address]       Radiology Studies: No results found.   Scheduled Meds:  apixaban  5 mg Oral BID   atorvastatin  10 mg Oral Daily   insulin aspart  0-15 Units Subcutaneous TID WC   insulin aspart  0-5 Units Subcutaneous QHS   insulin aspart  7 Units Subcutaneous TID WC   insulin glargine-yfgn  50 Units Subcutaneous QHS   isosorbide mononitrate  60 mg Oral Daily   metoprolol succinate  25 mg Oral Daily   pantoprazole  40 mg Oral BID   polyethylene glycol  34 g Oral BID   vitamin B-12  1,000 mcg Oral Daily   Continuous Infusions:  sodium chloride 75 mL/hr at 11/23/20 1416     LOS: 2 days     Enzo Bi, MD [facility] If 7PM-7AM, please contact night-coverage 11/23/2020, 2:31 PM

## 2020-11-24 ENCOUNTER — Encounter: Payer: Self-pay | Admitting: Physician Assistant

## 2020-11-24 DIAGNOSIS — R109 Unspecified abdominal pain: Secondary | ICD-10-CM | POA: Diagnosis not present

## 2020-11-24 LAB — BASIC METABOLIC PANEL
Anion gap: 8 (ref 5–15)
BUN: 30 mg/dL — ABNORMAL HIGH (ref 6–20)
CO2: 19 mmol/L — ABNORMAL LOW (ref 22–32)
Calcium: 8.3 mg/dL — ABNORMAL LOW (ref 8.9–10.3)
Chloride: 103 mmol/L (ref 98–111)
Creatinine, Ser: 1.95 mg/dL — ABNORMAL HIGH (ref 0.44–1.00)
GFR, Estimated: 30 mL/min — ABNORMAL LOW (ref >60)
Glucose, Bld: 244 mg/dL — ABNORMAL HIGH (ref 70–99)
Potassium: 3.7 mmol/L (ref 3.5–5.1)
Sodium: 130 mmol/L — ABNORMAL LOW (ref 135–145)

## 2020-11-24 LAB — CBC
HCT: 24.7 % — ABNORMAL LOW (ref 36.0–46.0)
Hemoglobin: 7.5 g/dL — ABNORMAL LOW (ref 12.0–15.0)
MCH: 27.9 pg (ref 26.0–34.0)
MCHC: 30.4 g/dL (ref 30.0–36.0)
MCV: 91.8 fL (ref 80.0–100.0)
Platelets: 331 10*3/uL (ref 150–400)
RBC: 2.69 MIL/uL — ABNORMAL LOW (ref 3.87–5.11)
RDW: 16.3 % — ABNORMAL HIGH (ref 11.5–15.5)
WBC: 13.5 10*3/uL — ABNORMAL HIGH (ref 4.0–10.5)
nRBC: 0 % (ref 0.0–0.2)

## 2020-11-24 LAB — MAGNESIUM: Magnesium: 1.7 mg/dL (ref 1.7–2.4)

## 2020-11-24 LAB — GLUCOSE, CAPILLARY
Glucose-Capillary: 240 mg/dL — ABNORMAL HIGH (ref 70–99)
Glucose-Capillary: 167 mg/dL — ABNORMAL HIGH (ref 70–99)

## 2020-11-24 MED ORDER — NOVOLOG FLEXPEN 100 UNIT/ML ~~LOC~~ SOPN: 30.0000 [IU] | PEN_INJECTOR | Freq: Four times a day (QID) | SUBCUTANEOUS | Status: DC

## 2020-11-24 MED ORDER — METOPROLOL SUCCINATE ER 50 MG PO TB24
50.0000 mg | ORAL_TABLET | Freq: Every day | ORAL | Status: DC
  Administered 2020-11-24: 50 mg via ORAL
  Filled 2020-11-24: qty 1

## 2020-11-24 MED ORDER — TORSEMIDE 20 MG PO TABS: 40.0000 mg | ORAL_TABLET | Freq: Every day | ORAL | 0 refills | Status: DC

## 2020-11-24 MED ORDER — CYANOCOBALAMIN 1000 MCG PO TABS: 1000.0000 ug | ORAL_TABLET | Freq: Every day | ORAL | Status: DC

## 2020-11-24 MED ORDER — POLYETHYLENE GLYCOL 3350 17 G PO PACK: 34.0000 g | PACK | Freq: Every day | ORAL | 0 refills | Status: DC

## 2020-11-24 MED ORDER — LANTUS SOLOSTAR 100 UNIT/ML ~~LOC~~ SOPN: 50.0000 [IU] | PEN_INJECTOR | Freq: Every day | SUBCUTANEOUS | Status: DC

## 2020-11-24 MED ORDER — METOPROLOL SUCCINATE ER 50 MG PO TB24: 50.0000 mg | ORAL_TABLET | Freq: Every day | ORAL | 2 refills | Status: DC

## 2020-11-24 NOTE — Progress Notes (Signed)
[facility] Progress Note  Subjective: creat down 1.9 today, abd pain better, eating and drinking today  Vitals:   11/23/20 0517 11/23/20 1238 11/23/20 2034 11/24/20 0508  BP: 129/76 139/89 (!) 144/81 (!) 131/59  Pulse: (!) 109 (!) 110 (!) 110 (!) 106  Resp:  '18 18 20  ' Temp:  97.7 F (36.5 C) 98 F (36.7 C) 98 F (36.7 C)  TempSrc:  Oral    SpO2: 99% 99% 100% 100%  Weight:      Height:        Exam:  alert, nad , obese  no jvd  Chest cta bilat, lying flat  Cor reg no RG  Abd soft ntnd no ascites   Ext mild ankle edema bilat    Alert, NF, ox3     Home meds include - lipitor, duloxetine, eliquis, hydralazine, dilaudid po prn, imdur, lantus insulin, metoprolol xl, sl ntg prn, novolog insulin, protonix, aldactone, demadex 40 qd, prns/ vitamins    Echo 9/16 /22-  LFVEF 50-55%, severe LVH, RV syst fxn wnl, no valve disease   ABd CT 9/15 - Adrenals/Urinary Tract: The adrenals are unremarkable. The kidneys are normal, with no focal lesion, stone, hydronephrosis, or hydroureter. The bladder is unremarkable   UA 9/15 - >300 prot, 0-5 rbc/ wbc/ epis   UNa 25 , UCr 358      Date                          Creat               eGFR   2011- 2016                0.77- 1.20   2017                          0.88- 1.59   2018                          0.88- 1.69   2019                          0.97- 1.75   2020                          1.33- 1.86   Jan- mar 2021           1.34- 2.23 June 2019                  1.27- 2.70   May                           1.07- 2.10   June                          1.28- 2.05   Sept 2021                  1.40 - 2.35   Nov 2021                   1.22- 2.26        25- 52 ml/min  Jan -mar 2022            1.58- 2.10  27- 38 ml/min  May 2022                   4.38 >> 2.71   Jun- July                    1.64- 2.44        23- 37 ml/min   Aug 2022                   2.16 >> 1.12   Sept 1-2, 2022          1.16- 1.31        48- 55 ml/min     Sept  15                      2.14, 1.72   Sept 16                      1.79    Sept 17                     2.95      Assessment/ Plan: AKI on CKD 3b - b/l creat nov 2021 is around 1.2- 2.2, eGFR 25- 52. Creat here 2.1 on admit , up to 2.9 on 9/17. CT shows no hydro, UA is unremarkable. No ACEi/ ARB/ nsaids. BP's are lower than on admit. Urine lytes c/w vol depletion. AKI d/t intravasc vol depletion related to abd pain/ poor po intake w/ continued torsemide/ aldactone usage. Creat down to baseline w/ IVF"s. OK for dc. Recommend hold torsemide 3-5 days then resume at prior dose, and dc aldactone. Told patient not to take torsemide on days that she vomiting or diarrhea. Our office will contact her for f/u visit w/ Dr Candiss Norse at [facility].  Will sign off.  ABd pain / nausea/ vomiting - better, eating today Hx chronic pain syndrome IDDM HTN - getting metoprolol xl 25 qd here Obesity Anemia - prob CKD related to some degree +/- other         Rob Yarnell Kozloski 11/24/2020, 1:13 PM   Recent Labs  Lab 11/21/20 0011 11/22/20 0522 11/23/20 0529 11/24/20 0417  K 3.0*   < > 3.7 3.7  BUN 20   < > 32* 30*  CREATININE 1.79*   < > 2.65* 1.95*  CALCIUM 8.2*   < > 8.0* 8.3*  PHOS 3.7  --   --   --   HGB 9.6*   < > 8.3* 7.5*   < > = values in this interval not displayed.    Inpatient medications:  apixaban  5 mg Oral BID   atorvastatin  10 mg Oral Daily   insulin aspart  0-15 Units Subcutaneous TID WC   insulin aspart  0-5 Units Subcutaneous QHS   insulin aspart  10 Units Subcutaneous TID WC   insulin glargine-yfgn  50 Units Subcutaneous QHS   isosorbide mononitrate  60 mg Oral Daily   metoprolol succinate  50 mg Oral Daily   pantoprazole  40 mg Oral BID   polyethylene glycol  34 g Oral BID   vitamin B-12  1,000 mcg Oral Daily     albuterol, docusate sodium, doxylamine (Sleep), morphine, ondansetron (ZOFRAN) IV, ondansetron

## 2020-11-24 NOTE — Discharge Summary (Signed)
Physician Discharge Summary   Deborah [name]  female DOB: January 05, 1966  RXV:[id number]  PCP: Cena Benton, MD  Admit date: 11/20/2020 Discharge date: 11/24/2020  Admitted From: home Disposition:  home CODE STATUS: Full code  Discharge Instructions     Discharge instructions   Complete by: As directed    As we discussed, repeated CT scan and endoscopy had found nothing wrong structurally to explain your nausea vomiting and abdominal pain, so the presumed etiology is still gastroparesis.  With gastroparesis, main treatment is diet change and avoiding opioids pain medication.  You have had no problem eating regular meals, so you can go home now.  Your kidney function has improved with IV fluid.  You are probably dehydrated from elevated blood sugars from the sugary sodas.  Try to keep hydrated with non-sugary drinks.    I have increased your Toprol to 50 mg daily and discontinued your hydralazine and Aldactone.  Kidney doctor wants you to resume your torsemide 3 days after discharge.     Dr. Enzo Bi [facility] Course:  For full details, please see H&P, progress notes, consult notes and ancillary notes.  Briefly,  Deborah [name] is a 55 y.o. female with medical history significant of DM2, gastroparesis, chornic pain syndrome, a.fib on eliquis, CHF, CVA, cardiac arrest, frequent admissions for N/V and abdominal pain who presented with 3 days of nausea vomiting and stabbing abdominal pain.   N/V and abdominal pain  Frequent admissions Drug-seeking behavior --21 hospitalizations in the past 2 years.  Many documentations of pt demanding IV dilaudid and nothing else, including current hospitalization.  14 CT a/p in the past 2 years, including 3 in just the past month, all unremarkable.  Pt has been followed by GI and had EGD recently in May 2022 which was unremarkable.  Since no discernable etiology was found for pt's N/V and abdominal pain, pt had  mainly been treated as gastroparesis in the past. --the day after admission pt said she wanted diet to be upgraded from clear liquid to regular, stating that she could eat without pain, however, at the same time insisting on IV dilaudid for pain.  When oral dilaudid was offered (pt has current Rx), she said she doesn't take oral dilaudid because it's too strong. --IV dilaudid started on presentation and d/c'ed on 9/16.  Pt received oral morphine 15 mg q4h PRN during hospitalization.  Pt was not discharged on opioids pain meds, as pt has a current Rx for oral dilaudid that she had already picked up on 11/04/20.  Advised pt that she can cut or crush her oral dilaudid if she finds it too strong.   Gastroparesis --Per GI consult note on 01/12/20, "Diabetic gastroparesis that will not improve as long as she remains on narcotic pain meds."  "Would not retry Reglan due to history of tremors and Reglan also unlikely to help as long as she is on Dilaudid. Other prokinetics unlikely to help as long as she is on the Dilaudid. If she continues to have N/V then change to NPO." --pt refused gastroparesis diet, and was tolerating regular diet while taking her pain meds.   AKI on CKD3a --Cr 2.14 on presentation, improved to 1.72 with IVF.  Cr was 1.16 on 11/07/20.  Met criteria for AKI, however, Cr has been all over the place, and 1.16 may be an outlier.   --Cr unexpected jumped to 2.95 morning of 9/17.   --nephrology  consulted and started pt on MIVF, with improvement in Cr.  Pt may be dehydrated from drinking large amount of sugary sodas, and resulting in polyuria. --Cr improved to 1.95 prior to discharge.    Hypokalemia  --monitor and replete with oral potassium    Uncontrolled type 2 diabetes mellitus with hyperglycemia ([place])  Dietary indiscretion --pt insisting on drinking a lot of regular coke. --cont on home insulin regimen    PAF on Eliquis continue Eliquis --Home Toprol increased to 50 mg daily due to  elevated HR.    Nonalcoholic fatty liver disease  Chronic stable    Morbid obesity, BMI 45.87 would benefit from outpatient follow-up    GERD (gastroesophageal reflux disease) --cont PI    Essential hypertension --cont Toprol at increased 50 mg daily --cont Imdur --hydralazine d/c'ed. --torsemide and spironolactone held during hospitalization due to AKI.  Spironolactone d/c'ed and pt to resume home torsemide 3 days after discharge, per nephrology rec.    Chronic systolic CHF  -- currently appears to be slightly on the dry side --torsemide and spironolactone held during hospitalization due to AKI.  Spironolactone d/c'ed and pt to resume home torsemide 3 days after discharge, per nephrology rec.    Chest pain Chronic mild troponin elevation --trop 52, and flat.  No EKG changes suggestive of ischemia.   --cardiology consulted on admission --Echo, no wall motion abnormality --cardiology consulted, no further workup needed     Normocytic anemia  Vit B12 def --anemia workup showed Vit B12 def at 180 --cont vit B12 supplement   Hx of duodenal carcinoid tumor s/p removal    Leukocytosis, chronic Chronic stable    Hypomagnesemia  --monitor and replete with IV   Chronic pain on chronic opioids --pt has Rx for oral dilaudid for the past year, last filled on 11/04/20, however, now stated that she doesn't take them anymore because they are too strong. --Pt received oral morphine 15 mg q4h PRN during hospitalization.  Pt was not discharged on opioids pain meds, as pt has a current Rx for oral dilaudid that she had already picked up.  Advised pt that she can cut or crush her oral dilaudid if she finds it too strong.   Discharge Diagnoses:  Active Problems:   Essential hypertension, benign   Uncontrolled type 2 diabetes mellitus with diabetic neuropathy, with long-term current use of insulin (HCC)   Chronic kidney disease, stage 3b (HCC)   Chest pain   Hypokalemia    Hypomagnesemia   Normocytic anemia   Diabetic gastroparesis (HCC)   GERD (gastroesophageal reflux disease)   Morbid obesity (HCC)   Leukocytosis   Abdominal pain   Intractable nausea and vomiting   Chronic systolic CHF (congestive heart failure) ([place])   Uncontrolled type 2 diabetes mellitus with hyperglycemia (HCC)   Nonalcoholic fatty liver disease   Malignant carcinoid tumor of duodenum (HCC)   Chronic pain   AKI (acute kidney injury) ([place])   PAF (paroxysmal atrial fibrillation) (HCC)   Prolonged QT interval   Acute renal failure superimposed on stage 3b chronic kidney disease (HCC)   Nausea and vomiting   Elevated troponin   30 Day Unplanned Readmission Risk Score    Flowsheet Row ED to Hosp-Admission (Current) from 11/20/2020 in [place]  30 Day Unplanned Readmission Risk Score (%) 53.39 Filed at 11/24/2020 0801       This score is the patient's risk of an unplanned readmission within 30 days of being discharged (  0 -100%). The score is based on dignosis, age, lab data, medications, orders, and past utilization.   Low:  0-14.9   Medium: 15-21.9   High: 22-29.9   Extreme: 30 and above         Discharge Instructions:  Allergies as of 11/24/2020       Reactions   Diazepam [name]   Gabapentin [name], Swelling   Other reaction(s): Unknown   Iodinated Diagnostic Agents Anaphylaxis   11/29/17 Cardiac arrest 1 min after IV contrast, possible allergy vs vasovagal episode Iopamidol  Anaphylaxis  High 11/28/2017  Patient had seizure like activity and then code post 100 cc of isovue 300     Isovue [iopamidol] Anaphylaxis   11/28/17 Patient had seizure like activity and then 1 min code after 100 cc of isovue 300. Possible contrast allergy vs vasovagal episode   Lisinopril Anaphylaxis   Tongue and mouth swelling   Metoclopramide Other (See Comments)   Tardive dyskinesia   Nsaids Anaphylaxis, Other (See  Comments)   ULCER   Penicillins Palpitations   Has patient had a PCN reaction causing immediate rash, facial/tongue/throat swelling, SOB or lightheadedness with hypotension: Yes, heart races Has patient had a PCN reaction causing severe rash involving mucus membranes or skin necrosis: No Has patient had a PCN reaction that required hospitalization: Yes  Has patient had a PCN reaction occurring within the last 10 years: No   Tolmetin Nausea Only, Other (See Comments)   ULCER   Aspirin Other (See Comments)   Irritates stomach ulcer    Cyclobenzaprine    Other reaction(s): Unknown   Rifamycins Other (See Comments)   unknown   Acetaminophen Nausea Only, Other (See Comments)   Irritates stomach ulcer; Abdominal pain   Oxycodone Palpitations   Tramadol Nausea And Vomiting        Medication List     STOP taking these medications    hydrALAZINE 50 MG tablet Commonly known as: APRESOLINE   promethazine 12.5 MG tablet Commonly known as: PHENERGAN   spironolactone 25 MG tablet Commonly known as: ALDACTONE       TAKE these medications    albuterol (2.5 MG/3ML) 0.083% nebulizer solution Commonly known as: PROVENTIL Take 3 mLs (2.5 mg total) by nebulization every 6 (six) hours as needed for wheezing or shortness of breath.   atorvastatin 10 MG tablet Commonly known as: LIPITOR TAKE ONE TABLET BY MOUTH ONCE DAILY (AM) What changed: See the new instructions.   blood glucose meter kit and supplies Kit Dispense based on patient and insurance preference. Use up to four times daily as directed. (FOR ICD-9 250.00, 250.01).   cetirizine 10 MG tablet Commonly known as: ZYRTEC Take 10 mg by mouth daily.   cyanocobalamin 1000 MCG tablet Take 1 tablet (1,000 mcg total) by mouth daily. Can take any over-the-counter Vit B12 supplement.   DULoxetine HCl 40 MG Cpep TAKE ONE CAPSULE BY MOUTH TWICE DAILY What changed:  how much to take when to take this   Easy Comfort Pen Needles  31G X 5 MM Misc Generic drug: Insulin Pen Needle USE 3 TIMES A DAY FOR INSULIN ADMINISTRATION   Eliquis 5 MG Tabs tablet Generic drug: apixaban TAKE 1 TABLET BY MOUTH 2 (TWO) TIMES DAILY. What changed: how much to take   fluticasone 50 MCG/ACT nasal spray Commonly known as: FLONASE Place 2 sprays into both nostrils daily as needed for allergies or rhinitis.   HYDROmorphone 4 MG tablet Commonly known as: Dilaudid  Take 1 tablet (4 mg total) by mouth 2 (two) times daily as needed for severe pain.   isosorbide mononitrate 60 MG 24 hr tablet Commonly known as: IMDUR TAKE 1 TABLET (60 MG TOTAL) BY MOUTH DAILY.   Lantus SoloStar 100 UNIT/ML Solostar Pen Generic drug: insulin glargine Inject 50 Units into the skin at bedtime.   melatonin 3 MG Tabs tablet TAKE 2 TABLETS (6 MG TOTAL) BY MOUTH AT BEDTIME.   metoprolol succinate 50 MG 24 hr tablet Commonly known as: TOPROL-XL Take 1 tablet (50 mg total) by mouth daily. Take with or immediately following a meal. What changed:  medication strength how much to take additional instructions   nitroGLYCERIN 0.4 MG SL tablet Commonly known as: NITROSTAT Place 1 tablet (0.4 mg total) under the tongue every 5 (five) minutes as needed for chest pain.   NovoLOG FlexPen 100 UNIT/ML FlexPen Generic drug: insulin aspart Inject 30 Units into the skin in the morning, at noon, in the evening, and at bedtime.   ondansetron 4 MG disintegrating tablet Commonly known as: Zofran ODT Take 1 tablet (4 mg total) by mouth every 8 (eight) hours as needed for nausea or vomiting.   pantoprazole 40 MG tablet Commonly known as: PROTONIX TAKE 1 TABLET (40 MG TOTAL) BY MOUTH 2 (TWO) TIMES DAILY.   polyethylene glycol 17 g packet Commonly known as: MIRALAX / GLYCOLAX Take 34 g by mouth daily.   tiZANidine 4 MG tablet Commonly known as: ZANAFLEX Take 4 mg by mouth 3 (three) times daily as needed for muscle spasms.   torsemide 20 MG tablet Commonly  known as: DEMADEX Take 2 tablets (40 mg total) by mouth daily. Start taking on: November 28, 2020 What changed: These instructions start on November 28, 2020. If you are unsure what to do until then, ask your doctor or other care provider.         Follow-up Information     Cena Benton, MD Follow up in 1 week(s).   Specialty: Internal Medicine Contact information: [address] [place] [address] [phone]         Your outpatient nephrology Follow up in 2 week(s).                  Allergies  Allergen Reactions   Diazepam [name]   Gabapentin [name] and Swelling    Other reaction(s): Unknown   Iodinated Diagnostic Agents Anaphylaxis    11/29/17 Cardiac arrest 1 min after IV contrast, possible allergy vs vasovagal episode Iopamidol  Anaphylaxis  High 11/28/2017  Patient had seizure like activity and then code post 100 cc of isovue 300     Isovue [Iopamidol] Anaphylaxis    11/28/17 Patient had seizure like activity and then 1 min code after 100 cc of isovue 300. Possible contrast allergy vs vasovagal episode   Lisinopril Anaphylaxis    Tongue and mouth swelling   Metoclopramide Other (See Comments)    Tardive dyskinesia   Nsaids Anaphylaxis and Other (See Comments)    ULCER   Penicillins Palpitations    Has patient had a PCN reaction causing immediate rash, facial/tongue/throat swelling, SOB or lightheadedness with hypotension: Yes, heart races Has patient had a PCN reaction causing severe rash involving mucus membranes or skin necrosis: No Has patient had a PCN reaction that required hospitalization: Yes  Has patient had a PCN reaction occurring within the last 10 years: No    Tolmetin Nausea Only and Other (  See Comments)    ULCER   Aspirin Other (See Comments)    Irritates stomach ulcer    Cyclobenzaprine     Other reaction(s): Unknown   Rifamycins Other (See Comments)    unknown    Acetaminophen Nausea Only and Other (See Comments)    Irritates stomach ulcer; Abdominal pain   Oxycodone Palpitations   Tramadol Nausea And Vomiting     The results of significant diagnostics from this hospitalization (including imaging, microbiology, ancillary and laboratory) are listed below for reference.   Consultations:   Procedures/Studies: CT ABDOMEN PELVIS WO CONTRAST  Result Date: 11/20/2020 CLINICAL DATA:  Epigastric pain EXAM: CT ABDOMEN AND PELVIS WITHOUT CONTRAST TECHNIQUE: Multidetector CT imaging of the abdomen and pelvis was performed following the standard protocol without IV contrast. COMPARISON:  CT abdomen/pelvis 11/07/2020 FINDINGS: Lower chest: The lung bases are clear. The imaged heart is unremarkable. Hepatobiliary: The liver is unremarkable. The gallbladder is surgically absent. There is no biliary ductal dilatation. Pancreas: Unremarkable. Spleen: Unremarkable Adrenals/Urinary Tract: The adrenals are unremarkable. The kidneys are normal, with no focal lesion, stone, hydronephrosis, or hydroureter. The bladder is unremarkable. Stomach/Bowel: The stomach is unremarkable. There is no evidence of bowel obstruction. There is no abnormal bowel wall thickening or inflammatory change. The appendix is normal. Vascular/Lymphatic: The abdominal aorta is nonaneurysmal with minimal calcified atherosclerotic plaque. There is no abdominal or pelvic lymphadenopathy. Reproductive: There is a 4.9 cm soft tissue density lesion in the left adnexa, unchanged going back multiple studies. There is no new adnexal lesion. Other: There is no ascites or free air. Musculoskeletal: There are degenerative changes at L5-S1 with bulky anterior osteophytes, unchanged. IMPRESSION: No acute findings in the abdomen or pelvis. No change since 11/07/2020. Aortic Atherosclerosis (ICD10-I70.0). Electronically Signed   By: Valetta Mole M.D.   On: 11/20/2020 16:19   CT ABDOMEN PELVIS WO CONTRAST  Result Date:  11/07/2020 CLINICAL DATA:  Abdominal pain, acute, nonlocalized EXAM: CT ABDOMEN AND PELVIS WITHOUT CONTRAST TECHNIQUE: Multidetector CT imaging of the abdomen and pelvis was performed following the standard protocol without IV contrast. COMPARISON:  10/22/2020 FINDINGS: Lower chest: No acute abnormality. Hepatobiliary: Stable appearance.  Post cholecystectomy. Pancreas: Unremarkable. Spleen: Normal. Adrenals/Urinary Tract: Adrenals, kidneys, and bladder are unremarkable. Stomach/Bowel: Stomach is within normal limits. Bowel is normal in caliber. Vascular/Lymphatic: Minimal aortic atherosclerosis. No enlarged lymph nodes. Reproductive: A probable fibroid is again noted on the left. No adnexal mass. Other: No free fluid.  Abdominal wall is unremarkable. Musculoskeletal: Lower lumbar spine degenerative changes. IMPRESSION: No acute abnormality. No significant change since recent prior study. Electronically Signed   By: Macy Mis M.D.   On: 11/07/2020 11:50   DG Abd 1 View  Result Date: 11/23/2020 CLINICAL DATA:  Nausea and vomiting with abdominal pain. EXAM: ABDOMEN - 1 VIEW COMPARISON:  CT abdomen and pelvis 11/20/2020. FINDINGS: The bowel gas pattern is normal. No radio-opaque calculi or other significant radiographic abnormality are seen. There are surgical clips in the right abdomen. IMPRESSION: Negative. Electronically Signed   By: Ronney Asters M.D.   On: 11/23/2020 15:43   DG Abd 1 View  Result Date: 11/06/2020 CLINICAL DATA:  Abdominal pain. EXAM: ABDOMEN - 1 VIEW COMPARISON:  Radiograph dated 10/07/2020 CT dated 10/22/2020 FINDINGS: No bowel dilatation or evidence of obstruction. No free air or radiopaque calculi. Right upper quadrant cholecystectomy clips. Degenerative changes of the spine. No acute osseous pathology. IMPRESSION: Negative. Electronically Signed   By: Anner Crete M.D.   On: 11/06/2020  23:38   ECHOCARDIOGRAM COMPLETE  Result Date: 11/21/2020    ECHOCARDIOGRAM REPORT    Patient Name:   Deborah [name] Date of Exam: 11/21/2020 Medical Rec #:  [id number]            Height:       66.0 in Accession #:    [id number]           Weight:       284.2 lb Date of Birth:  Dec 24, 1965            BSA:          2.322 m Patient Age:    18 years             BP:           113/85 mmHg Patient Gender: F                    HR:           90 bpm. Exam Location:  Inpatient Procedure: 2D Echo, Cardiac Doppler, Color Doppler and Strain Analysis Indications:    Elevated troponin. ; R07.9* Chest pain, unspecified  History:        Patient has prior history of Echocardiogram examinations, most                 recent 07/23/2019. CHF, Abnormal ECG, COPD, Arrythmias:Cardiac                 Arrest and Atrial Fibrillation, Signs/Symptoms:Dyspnea,                 [name] and Chest Pain; Risk Factors:Hypertension                 and Diabetes. Hypoxia. Kidney disease.  Sonographer:    Roseanna Rainbow RDCS Referring Phys: Calvin  Sonographer Comments: Technically difficult study due to poor echo windows and patient is morbidly obese. Image acquisition challenging due to patient body habitus. Global longitudinal strain was attempted. IMPRESSIONS  1. Left ventricular ejection fraction, by estimation, is 50 to 55%. The left ventricle has low normal function. The left ventricle has no regional wall motion abnormalities. There is severe concentric left ventricular hypertrophy. Left ventricular diastolic parameters are indeterminate. Elevated left atrial pressure.  2. Right ventricular systolic function is normal. The right ventricular size is normal. Tricuspid regurgitation signal is inadequate for assessing PA pressure.  3. The mitral valve is normal in structure. No evidence of mitral valve regurgitation.  4. The aortic valve is tricuspid. Aortic valve regurgitation is not visualized. No aortic stenosis is present.  5. The inferior vena cava is normal in size with greater than 50% respiratory  variability, suggesting right atrial pressure of 3 mmHg. Comparison(s): A prior study was performed on 07/23/2019. Similar to prior. FINDINGS  Left Ventricle: Left ventricular ejection fraction, by estimation, is 50 to 55%. The left ventricle has low normal function. The left ventricle has no regional wall motion abnormalities. Global longitudinal strain performed but not reported based on interpreter judgement due to suboptimal tracking. The left ventricular internal cavity size was normal in size. There is severe concentric left ventricular hypertrophy. Left ventricular diastolic parameters are indeterminate. Elevated left atrial pressure. Right Ventricle: The right ventricular size is normal. No increase in right ventricular wall thickness. Right ventricular systolic function is normal. Tricuspid regurgitation signal is inadequate for assessing PA pressure. Left Atrium: Left atrial size was normal in size. Right Atrium: Right atrial size was  normal in size. Pericardium: There is no evidence of pericardial effusion. Mitral Valve: The mitral valve is normal in structure. No evidence of mitral valve regurgitation. Tricuspid Valve: The tricuspid valve is grossly normal. Tricuspid valve regurgitation is not demonstrated. Aortic Valve: The aortic valve is tricuspid. Aortic valve regurgitation is not visualized. No aortic stenosis is present. Pulmonic Valve: The pulmonic valve was not well visualized. Pulmonic valve regurgitation is not visualized. No evidence of pulmonic stenosis. Aorta: The aortic root and ascending aorta are structurally normal, with no evidence of dilitation. Venous: The inferior vena cava is normal in size with greater than 50% respiratory variability, suggesting right atrial pressure of 3 mmHg. IAS/Shunts: The atrial septum is grossly normal.  LEFT VENTRICLE PLAX 2D LVIDd:         4.10 cm     Diastology LVIDs:         3.10 cm     LV e' medial:    4.03 cm/s LV PW:         2.00 cm     LV E/e' medial:   18.7 LV IVS:        1.40 cm     LV e' lateral:   4.57 cm/s LVOT diam:     2.20 cm     LV E/e' lateral: 16.5 LV SV:         65 LV SV Index:   28 LVOT Area:     3.80 cm  LV Volumes (MOD) LV vol d, MOD A2C: 74.5 ml LV vol d, MOD A4C: 90.0 ml LV vol s, MOD A2C: 38.9 ml LV vol s, MOD A4C: 45.3 ml LV SV MOD A2C:     35.6 ml LV SV MOD A4C:     90.0 ml LV SV MOD BP:      41.7 ml IVC IVC diam: 1.20 cm LEFT ATRIUM             Index       RIGHT ATRIUM           Index LA diam:        3.30 cm 1.42 cm/m  RA Area:     12.70 cm LA Vol (A2C):   58.2 ml 25.07 ml/m RA Volume:   23.00 ml  9.91 ml/m LA Vol (A4C):   39.7 ml 17.10 ml/m LA Biplane Vol: 48.1 ml 20.72 ml/m  AORTIC VALVE LVOT Vmax:   101.00 cm/s LVOT Vmean:  65.100 cm/s LVOT VTI:    0.171 m  AORTA Ao Root diam: 3.50 cm Ao Asc diam:  3.40 cm MITRAL VALVE MV Area (PHT): 4.06 cm    SHUNTS MV Decel Time: 187 msec    Systemic VTI:  0.17 m MV E velocity: 75.40 cm/s  Systemic Diam: 2.20 cm MV A velocity: 81.40 cm/s MV E/A ratio:  0.93 Rudean Haskell MD Electronically signed by Rudean Haskell MD Signature Date/Time: 11/21/2020/1:11:55 PM    Final       Labs: BNP (last 3 results) Recent Labs    01/23/20 1315 05/25/20 1102 08/28/20 0937  BNP 165.0* 51.2 75.3   Basic Metabolic Panel: Recent Labs  Lab 11/20/20 1233 11/20/20 2315 11/21/20 0011 11/22/20 0522 11/23/20 0529 11/24/20 0417  NA 138 138 138 137 134* 130*  K 2.4* 3.2* 3.0* 3.9 3.7 3.7  CL 104 103 103 106 104 103  CO2 19* 22 22 19* 23 19*  GLUCOSE 103* 162* 159* 291* 255* 244*  BUN 23* 21* 20 23* 32* 30*  CREATININE 2.14* 1.72* 1.79* 2.95* 2.65* 1.95*  CALCIUM 8.9 8.2* 8.2* 8.2* 8.0* 8.3*  MG 1.1*  --  1.4* 2.3 2.0 1.7  PHOS  --   --  3.7  --   --   --    Liver Function Tests: Recent Labs  Lab 11/20/20 1233 11/21/20 0011  AST 20 17  ALT 14 12  ALKPHOS 102 99  BILITOT 0.6 0.6  PROT 8.6* 8.1  ALBUMIN 3.7 3.4*   Recent Labs  Lab 11/20/20 1233  LIPASE 22   No  results for input(s): AMMONIA in the last 168 hours. CBC: Recent Labs  Lab 11/20/20 1233 11/21/20 0011 11/22/20 0522 11/23/20 0529 11/24/20 0417  WBC 16.0* 16.9* 13.6* 13.3* 13.5*  NEUTROABS  --  12.0*  --   --   --   HGB 9.8* 9.6* 8.9* 8.3* 7.5*  HCT 31.1* 30.7* 29.2* 26.8* 24.7*  MCV 88.4 89.0 92.1 92.1 91.8  PLT 459* 451* 386 349 331   Cardiac Enzymes: No results for input(s): CKTOTAL, CKMB, CKMBINDEX, TROPONINI in the last 168 hours. BNP: Invalid input(s): POCBNP CBG: Recent Labs  Lab 11/23/20 1144 11/23/20 1638 11/23/20 2030 11/23/20 2354 11/24/20 0738  GLUCAP 210* 209* 251* 243* 240*   D-Dimer No results for input(s): DDIMER in the last 72 hours. Hgb A1c No results for input(s): HGBA1C in the last 72 hours. Lipid Profile No results for input(s): CHOL, HDL, LDLCALC, TRIG, CHOLHDL, LDLDIRECT in the last 72 hours. Thyroid function studies No results for input(s): TSH, T4TOTAL, T3FREE, THYROIDAB in the last 72 hours.  Invalid input(s): FREET3 Anemia work up No results for input(s): VITAMINB12, FOLATE, FERRITIN, TIBC, IRON, RETICCTPCT in the last 72 hours. Urinalysis    Component Value Date/Time   COLORURINE YELLOW 11/20/2020 1859   APPEARANCEUR CLEAR 11/20/2020 1859   LABSPEC 1.011 11/20/2020 1859   PHURINE 5.0 11/20/2020 1859   GLUCOSEU NEGATIVE 11/20/2020 1859   HGBUR SMALL (A) 11/20/2020 1859   BILIRUBINUR NEGATIVE 11/20/2020 1859   KETONESUR 5 (A) 11/20/2020 1859   PROTEINUR >=300 (A) 11/20/2020 1859   UROBILINOGEN 0.2 10/02/2013 2108   NITRITE NEGATIVE 11/20/2020 1859   LEUKOCYTESUR NEGATIVE 11/20/2020 1859   Sepsis Labs Invalid input(s): PROCALCITONIN,  WBC,  LACTICIDVEN Microbiology Recent Results (from the past 240 hour(s))  Resp Panel by RT-PCR (Flu A&B, Covid) Nasopharyngeal Swab     Status: None   Collection Time: 11/20/20  7:00 PM   Specimen: Nasopharyngeal Swab; Nasopharyngeal(NP) swabs in vial transport medium  Result Value Ref Range  Status   SARS Coronavirus 2 by RT PCR NEGATIVE NEGATIVE Final    Comment: (NOTE) SARS-CoV-2 target nucleic acids are NOT DETECTED.  The SARS-CoV-2 RNA is generally detectable in upper respiratory specimens during the acute phase of infection. The lowest concentration of SARS-CoV-2 viral copies this assay can detect is 138 copies/mL. A negative result does not preclude SARS-Cov-2 infection and should not be used as the sole basis for treatment or other patient management decisions. A negative result may occur with  improper specimen collection/handling, submission of specimen other than nasopharyngeal swab, presence of viral mutation(s) within the areas targeted by this assay, and inadequate number of viral copies(<138 copies/mL). A negative result must be combined with clinical observations, patient history, and epidemiological information. The expected result is Negative.  Fact Sheet for Patients:  EntrepreneurPulse.com.au  Fact Sheet for Healthcare Providers:  IncredibleEmployment.be  This test is no t yet approved or cleared by the [place] FDA and  has  been authorized for detection and/or diagnosis of SARS-CoV-2 by FDA under an Emergency Use Authorization (EUA). This EUA will remain  in effect (meaning this test can be used) for the duration of the COVID-19 declaration under Section 564(b)(1) of the Act, 21 U.S.C.section 360bbb-3(b)(1), unless the authorization is terminated  or revoked sooner.       Influenza A by PCR NEGATIVE NEGATIVE Final   Influenza B by PCR NEGATIVE NEGATIVE Final    Comment: (NOTE) The Xpert Xpress SARS-CoV-2/FLU/RSV plus assay is intended as an aid in the diagnosis of influenza from Nasopharyngeal swab specimens and should not be used as a sole basis for treatment. Nasal washings and aspirates are unacceptable for Xpert Xpress SARS-CoV-2/FLU/RSV testing.  Fact Sheet for  Patients: EntrepreneurPulse.com.au  Fact Sheet for Healthcare Providers: IncredibleEmployment.be  This test is not yet approved or cleared by the [place] FDA and has been authorized for detection and/or diagnosis of SARS-CoV-2 by FDA under an Emergency Use Authorization (EUA). This EUA will remain in effect (meaning this test can be used) for the duration of the COVID-19 declaration under Section 564(b)(1) of the Act, 21 U.S.C. section 360bbb-3(b)(1), unless the authorization is terminated or revoked.  Performed at [facility], [place] [address], [place], [name] [address]      Total time spend on discharging this patient, including the last patient exam, discussing the hospital stay, instructions for ongoing care as it relates to all pertinent caregivers, as well as preparing the medical discharge records, prescriptions, and/or referrals as applicable, is 50 minutes.    Enzo Bi, MD  [facility] 11/24/2020, 9:10 AM

## 2020-11-24 NOTE — Progress Notes (Addendum)
F/u arranged as OP per staff msg from Dr. Percival Spanish, appt placed on AVS. No prior PYP or CMRI available in [place]. Previously had mild LVH so this does not appear to have been considered in last notes. Can consider w/u for LVH if clinically appropriate at follow-up.

## 2020-11-24 NOTE — Plan of Care (Signed)
Discharge education completed with pt. Pt verbalizes understanding and cooperation. Pt dressed in home clothing prior to discharge. Pt to discharge home and has organized transportation home. Pt safety maintained.  Problem: Education: Goal: Knowledge of General Education information will improve Description: Including pain rating scale, medication(s)/side effects and non-pharmacologic comfort measures Outcome: Adequate for Discharge   Problem: Health Behavior/Discharge Planning: Goal: Ability to manage health-related needs will improve Outcome: Adequate for Discharge   Problem: Clinical Measurements: Goal: Ability to maintain clinical measurements within normal limits will improve Outcome: Adequate for Discharge Goal: Will remain free from infection Outcome: Adequate for Discharge Goal: Diagnostic test results will improve Outcome: Adequate for Discharge Goal: Respiratory complications will improve Outcome: Adequate for Discharge Goal: Cardiovascular complication will be avoided Outcome: Adequate for Discharge   Problem: Activity: Goal: Risk for activity intolerance will decrease Outcome: Adequate for Discharge   Problem: Nutrition: Goal: Adequate nutrition will be maintained Outcome: Adequate for Discharge   Problem: Coping: Goal: Level of anxiety will decrease Outcome: Adequate for Discharge   Problem: Elimination: Goal: Will not experience complications related to bowel motility Outcome: Adequate for Discharge Goal: Will not experience complications related to urinary retention Outcome: Adequate for Discharge   Problem: Pain Managment: Goal: General experience of comfort will improve Outcome: Adequate for Discharge   Problem: Safety: Goal: Ability to remain free from injury will improve Outcome: Adequate for Discharge   Problem: Skin Integrity: Goal: Risk for impaired skin integrity will decrease Outcome: Adequate for Discharge

## 2020-12-04 ENCOUNTER — Encounter: Payer: Medicare Other | Admitting: Registered Nurse

## 2020-12-18 ENCOUNTER — Other Ambulatory Visit: Payer: Self-pay | Admitting: Family Medicine

## 2021-01-01 ENCOUNTER — Encounter: Payer: Self-pay | Admitting: Physician Assistant

## 2021-01-01 NOTE — Progress Notes (Deleted)
Cardiology Office Note    Date:  01/01/2021   ID:  Deborah [name], DOB Mar 03, 1966, MRN [id number]  PCP:  Cena Benton, MD  Cardiologist:  Fransico Him, MD  Electrophysiologist:  None   Chief Complaint: ***  History of Present Illness:   Deborah [name] is a 55 y.o. female with history of  IDDM, gastroparesis, chronic pain syndrome, recurrent nausea/vomiting, fibromyalgia, PAF, CVA, carcinoid tumor of duodenum s/p resection, osteoarthritis, gastric ulcer, gout, HTN, HLD, obesity, CKD 3b, morbid obesity, chronically elevated troponin, chronic anemia, thrombocytosis by labs, B12 deficiency, left ventricular hypertrophy who presents for follow-up. Historically she uses a wheelchair so mobility is not good.  She has a complex medical history with frequent readmissions for varying medical issues including gastroparesis, DKA, GI issues, also found to have LV dysfunction, PAF and chest pain in the past. Cardiac arrest is listed in other PMH related to an event in 2019 that occurred 1 minute after IV contrast was given in the past, notes indicate allergy vs vasovagal episode. She had a normal cath at [facility] in 2019 showing normal coronary arteries at which time EF was 45-50%, consistent with NICM. In 2020 she developed paroxysmal atrial fibrillation treated with beta blockers and Eliquis with conversion to NSR. Lexiscan stress test 01/2019 at [facility] was intermediate risk in context of LVEF 36%, with defect felt c/w breast/chest wall artifact. She was last seen by our team in the hospital 06/2019 in the setting of chest pain treated medically at that time. Imdur was added for possible small vessel disease. Prior IM notes also relay concern for DSB in the past. She has a chronically elevated troponin level. [facility] echo 07/2019 showed EF 45-50%, global HK, moderately elevated PASP, mild aortic sclerosis without stenosis. She has also had follow-up testing at outside hospitals  including [facility] stress test 05/2020 at [place] was aborted due to chest pain, nausea and vomiting. Notes indicate this presentation was not felt consistent with ACS so no further testing pursued at that time.  Last outside echo 07/2020 showed EF 50-55% with borderline global hypokinesis, mild LAE, normal RV. She was recently admitted to the hospital 11/2020 with recurrent abdominal pain and electrolyte abnormalities, see consult note for full details. Cardiology was consulted for mildly elevated troponin felt consistent with prior. She had reported some fleeting chest pain. Repeat echo 11/21/20 was reassuring with EF 50-55% although did note severe LVH. Dr. Percival Spanish suggested consideration of PYP scan or cMRI as outpatient. She was admitted to another outside facility 12/10/20 with n/v/abd pain and recurrent concern for DSB with again electrolyte abnormalities and AKI.  Pcp f/u for lytes, cr, anemia  Chest pain/chronically elevated troponin Left ventricular hypertrophy Paroxysmal atrial fibrillation Hx of NICM Hx of hypokalemia, hypomagnesemia  Labwork independently reviewed: 12/10/20 Hgb 8.7, plt 407, K 3.7, Cr 1.68, Mg 1.2, flat troponins in the 40s 11/2020 TSH wnl  Past Medical History:  Diagnosis Date   Acute back pain with sciatica, left    Acute back pain with sciatica, right    AKI (acute kidney injury) ([place])    Anemia, unspecified    Cancer ([place])    Carcinoid tumor of duodenum    Chest pain with normal coronary angiography 2019   Chronic kidney disease, stage 3b (HCC)    Chronic pain    Chronic systolic CHF (congestive heart failure) ([place])    Diabetes mellitus    DKA (diabetic ketoacidosis) ([place])    Drug-seeking  behavior    21 hospitalizations and 14 CT a/p in 2 years for N/V and abdominal pain, demanding only IV dilaudid   Elevated troponin    chronic   Esophageal reflux    Fibromyalgia    Gastric ulcer    Gastroparesis    Gout    Hyperlipidemia     Hypertension    Hypokalemia    Hypomagnesemia    Lumbosacral stenosis    Morbid obesity (HCC)    NICM (nonischemic cardiomyopathy) ([place])    PAF (paroxysmal atrial fibrillation) ([place])    Stroke ([place]) 02/2011   Thrombocytosis    Vitamin B12 deficiency anemia     Past Surgical History:  Procedure Laterality Date   BIOPSY  07/27/2019   Procedure: BIOPSY;  Surgeon: Clarene Essex, MD;  Location: [facility];  Service: Endoscopy;;   BIOPSY  07/30/2019   Procedure: BIOPSY;  Surgeon: Otis Brace, MD;  Location: [facility];  Service: Gastroenterology;;   CATARACT EXTRACTION  01/2014   CHOLECYSTECTOMY     COLONOSCOPY WITH PROPOFOL N/A 07/30/2019   Procedure: COLONOSCOPY WITH PROPOFOL;  Surgeon: Otis Brace, MD;  Location: [facility];  Service: Gastroenterology;  Laterality: N/A;   ESOPHAGOGASTRODUODENOSCOPY N/A 07/27/2019   Procedure: ESOPHAGOGASTRODUODENOSCOPY (EGD);  Surgeon: Clarene Essex, MD;  Location: [facility];  Service: Endoscopy;  Laterality: N/A;   ESOPHAGOGASTRODUODENOSCOPY N/A 07/26/2020   Procedure: ESOPHAGOGASTRODUODENOSCOPY (EGD);  Surgeon: Arta Silence, MD;  Location: [facility];  Service: Endoscopy;  Laterality: N/A;   ESOPHAGOGASTRODUODENOSCOPY (EGD) WITH PROPOFOL N/A 08/02/2019   Procedure: ESOPHAGOGASTRODUODENOSCOPY (EGD) WITH PROPOFOL;  Surgeon: Otis Brace, MD;  Location: [facility];  Service: Gastroenterology;  Laterality: N/A;   HEMOSTASIS CLIP PLACEMENT  08/02/2019   Procedure: HEMOSTASIS CLIP PLACEMENT;  Surgeon: Otis Brace, MD;  Location: [facility];  Service: Gastroenterology;;   POLYPECTOMY  07/30/2019   Procedure: POLYPECTOMY;  Surgeon: Otis Brace, MD;  Location: [facility];  Service: Gastroenterology;;   POLYPECTOMY  08/02/2019   Procedure: POLYPECTOMY;  Surgeon: Otis Brace, MD;  Location: [facility];  Service: Gastroenterology;;    Current Medications: No outpatient medications have been marked as taking for the  01/05/21 encounter (Appointment) with Charlie Pitter, PA-C.   ***   Allergies:   Diazepam, Gabapentin, Iodinated diagnostic agents, Isovue [iopamidol], Lisinopril, Metoclopramide, Nsaids, Penicillins, Tolmetin, Aspirin, Cyclobenzaprine, Rifamycins, Acetaminophen, Oxycodone, and Tramadol   Social History   Socioeconomic History   Marital status: Single    Spouse name: Not on file   Number of children: Not on file   Years of education: Not on file   Highest education level: Not on file  Occupational History   Not on file  Tobacco Use   Smoking status: Never   Smokeless tobacco: Never  Vaping Use   Vaping Use: Never used  Substance and Sexual Activity   Alcohol use: No   Drug use: No   Sexual activity: Not Currently    Birth control/protection: None  Other Topics Concern   Not on file  Social History Narrative   ** Merged History Encounter **       Social Determinants of Health   Financial Resource Strain: Not on file  Food Insecurity: Not on file  Transportation Needs: Not on file  Physical Activity: Not on file  Stress: Not on file  Social Connections: Not on file     Family History:  The patient's ***family history includes Congestive Heart Failure (age of onset: 42) in her sister; Diabetes in her brother, father, mother, and sister; Heart  disease in her father.  ROS:   Please see the history of present illness. Otherwise, review of systems is positive for ***.  All other systems are reviewed and otherwise negative.    EKGs/Labs/Other Studies Reviewed:    Studies reviewed are outlined and summarized above. Reports included below if pertinent.  Cardiac Cath 2019 -[facility]  Discussed with patient (but may still be drowsy).   Angiographically normal coronary arteries.   No significant aortic valve gradient on catheter pullback.   Cath showed angiographically normal arteries. LVEDP 37mmHg. EF is 65%.  Wall motion is normal. There is no procedure  complication. Loss of blood  is <5 ml. No tissue sample is removed.    2D echo 05/2020 [facility] SUMMARY  There is mild concentric left ventricular hypertrophy.  Left ventricular systolic function is low normal.  There is borderline global hypokinesis of the left ventricle.  LV ejection fraction = 50-55%.  Probably no significant change in comparison with the prior study  noted    2D Echo 11/2020    1. Left ventricular ejection fraction, by estimation, is 50 to 55%. The  left ventricle has low normal function. The left ventricle has no regional  wall motion abnormalities. There is severe concentric left ventricular  hypertrophy. Left ventricular  diastolic parameters are indeterminate. Elevated left atrial pressure.   2. Right ventricular systolic function is normal. The right ventricular  size is normal. Tricuspid regurgitation signal is inadequate for assessing  PA pressure.   3. The mitral valve is normal in structure. No evidence of mitral valve  regurgitation.   4. The aortic valve is tricuspid. Aortic valve regurgitation is not  visualized. No aortic stenosis is present.   5. The inferior vena cava is normal in size with greater than 50%  respiratory variability, suggesting right atrial pressure of 3 mmHg.   Comparison(s): A prior study was performed on 07/23/2019. Similar to prior.     EKG:  EKG is ordered today, personally reviewed, demonstrating ***  Recent Labs: 08/28/2020: B Natriuretic Peptide 43.7 11/21/2020: ALT 12; TSH 1.538 11/24/2020: BUN 30; Creatinine, Ser 1.95; Hemoglobin 7.5; Magnesium 1.7; Platelets 331; Potassium 3.7; Sodium 130  Recent Lipid Panel    Component Value Date/Time   CHOL 104 07/22/2019 1529   CHOL 106 12/13/2018 0000   TRIG 185 (H) 07/22/2019 1529   HDL 30 (L) 07/22/2019 1529   HDL 36 (L) 12/13/2018 0000   CHOLHDL 3.5 07/22/2019 1529   VLDL 37 07/22/2019 1529   LDLCALC 37 07/22/2019 1529   LDLCALC 38 12/13/2018 0000    PHYSICAL  EXAM:    VS:  LMP 10/10/2012   BMI: There is no height or weight on file to calculate BMI.  GEN: Well nourished, well developed female in no acute distress HEENT: normocephalic, atraumatic Neck: no JVD, carotid bruits, or masses Cardiac: ***RRR; no murmurs, rubs, or gallops, no edema  Respiratory:  clear to auscultation bilaterally, normal work of breathing GI: soft, nontender, nondistended, + BS MS: no deformity or atrophy Skin: warm and dry, no rash Neuro:  Alert and Oriented x 3, Strength and sensation are intact, follows commands Psych: euthymic mood, full affect  Wt Readings from Last 3 Encounters:  11/23/20 297 lb 9.9 oz (135 kg)  11/07/20 279 lb 15.8 oz (127 kg)  11/04/20 299 lb (135.6 kg)     ASSESSMENT & PLAN:   ***     Disposition: F/u with ***   Medication Adjustments/Labs and Tests Ordered: Current medicines are  reviewed at length with the patient today.  Concerns regarding medicines are outlined above. Medication changes, Labs and Tests ordered today are summarized above and listed in the Patient Instructions accessible in Encounters.   Signed, Charlie Pitter, PA-C  01/01/2021 10:54 AM    [address] [address], [place], [place]  [address] Phone: [phone]; Fax: [phone]

## 2021-01-05 ENCOUNTER — Ambulatory Visit: Payer: Medicare Other | Admitting: Physician Assistant

## 2021-01-05 DIAGNOSIS — R778 Other specified abnormalities of plasma proteins: Secondary | ICD-10-CM

## 2021-01-05 DIAGNOSIS — I517 Cardiomegaly: Secondary | ICD-10-CM

## 2021-01-05 DIAGNOSIS — I480 Paroxysmal atrial fibrillation: Secondary | ICD-10-CM

## 2021-01-05 DIAGNOSIS — R0789 Other chest pain: Secondary | ICD-10-CM

## 2021-01-05 DIAGNOSIS — E876 Hypokalemia: Secondary | ICD-10-CM

## 2021-01-05 DIAGNOSIS — I428 Other cardiomyopathies: Secondary | ICD-10-CM

## 2021-01-08 ENCOUNTER — Encounter: Payer: Medicare Other | Attending: Registered Nurse | Admitting: Registered Nurse

## 2021-01-08 ENCOUNTER — Encounter: Payer: Self-pay | Admitting: Registered Nurse

## 2021-01-08 ENCOUNTER — Other Ambulatory Visit: Payer: Self-pay

## 2021-01-08 VITALS — BP 112/71 | HR 90

## 2021-01-08 DIAGNOSIS — G8929 Other chronic pain: Secondary | ICD-10-CM | POA: Insufficient documentation

## 2021-01-08 DIAGNOSIS — G894 Chronic pain syndrome: Secondary | ICD-10-CM | POA: Diagnosis present

## 2021-01-08 DIAGNOSIS — M545 Low back pain, unspecified: Secondary | ICD-10-CM | POA: Diagnosis present

## 2021-01-08 DIAGNOSIS — Z79891 Long term (current) use of opiate analgesic: Secondary | ICD-10-CM | POA: Diagnosis present

## 2021-01-08 DIAGNOSIS — C7A010 Malignant carcinoid tumor of the duodenum: Secondary | ICD-10-CM | POA: Insufficient documentation

## 2021-01-08 DIAGNOSIS — Z5181 Encounter for therapeutic drug level monitoring: Secondary | ICD-10-CM | POA: Insufficient documentation

## 2021-01-08 DIAGNOSIS — G893 Neoplasm related pain (acute) (chronic): Secondary | ICD-10-CM | POA: Diagnosis present

## 2021-01-08 MED ORDER — HYDROMORPHONE HCL 4 MG PO TABS: 4.0000 mg | ORAL_TABLET | Freq: Two times a day (BID) | ORAL | 0 refills | Status: DC | PRN

## 2021-01-08 NOTE — Progress Notes (Signed)
Subjective:    Patient ID: Deborah [name], female    DOB: [birth year], 55 y.o.   MRN: [id number]  HPI: Deborah [name] is a 55 y.o. female who returns for follow up appointment for chronic pain and medication refill. She states her pain is located in her lower back , abdominal pain and generalized pain. She  rates her pain 10. Her current exercise regime is walking short distances in her home otherwise she states she is in the wheelchair.   [name] last Dilaudid prescription was filled on 11/04/2020, she had a Rapid Drug screen on 11/20/2020.    [name] was admitted to [facility] on 11/06/2020- 11/08/2020, discharge summary reviewed.  [name] was admitted to [facility] on 09/15/-2022 to 11/24/2020 for Nausea, Vomiting and abdominal pain. Discharge summary reviewed.  [name] went to [facility] ED on 12/10/2020 for chest pain and gastroparesis, note was reviewed.   Pain Inventory Average Pain 10 Pain Right Now 10 My pain is constant, sharp, and stabbing  In the last 24 hours, has pain interfered with the following? General activity 10 Relation with others 10 Enjoyment of life 10 What TIME of day is your pain at its worst? morning , daytime, evening, and night Sleep (in general) Poor  Pain is worse with: walking, bending, sitting, standing, and some activites Pain improves with: medication Relief from Meds: 6  Family History  Problem Relation Age of Onset  . Diabetes Mother   . Diabetes Father   . Heart disease Father   . Diabetes Sister   . Congestive Heart Failure Sister 91  . Diabetes Brother    Social History   Socioeconomic History  . Marital status: Single    Spouse name: Not on file  . Number of children: Not on file  . Years of education: Not on file  . Highest education level: Not on file  Occupational History  . Not on file  Tobacco Use  . Smoking status: Never  . Smokeless tobacco: Never  Vaping Use  . Vaping  Use: Never used  Substance and Sexual Activity  . Alcohol use: No  . Drug use: No  . Sexual activity: Not Currently    Birth control/protection: None  Other Topics Concern  . Not on file  Social History Narrative   ** Merged History Encounter **       Social Determinants of Health   Financial Resource Strain: Not on file  Food Insecurity: Not on file  Transportation Needs: Not on file  Physical Activity: Not on file  Stress: Not on file  Social Connections: Not on file   Past Surgical History:  Procedure Laterality Date  . BIOPSY  07/27/2019   Procedure: BIOPSY;  Surgeon: Clarene Essex, MD;  Location: [facility];  Service: Endoscopy;;  . BIOPSY  07/30/2019   Procedure: BIOPSY;  Surgeon: Otis Brace, MD;  Location: [facility];  Service: Gastroenterology;;  . CATARACT EXTRACTION  01/2014  . CHOLECYSTECTOMY    . COLONOSCOPY WITH PROPOFOL N/A 07/30/2019   Procedure: COLONOSCOPY WITH PROPOFOL;  Surgeon: Otis Brace, MD;  Location: [facility];  Service: Gastroenterology;  Laterality: N/A;  . ESOPHAGOGASTRODUODENOSCOPY N/A 07/27/2019   Procedure: ESOPHAGOGASTRODUODENOSCOPY (EGD);  Surgeon: Clarene Essex, MD;  Location[facility];  Service: Endoscopy;  Laterality: N/A;  . ESOPHAGOGASTRODUODENOSCOPY N/A 07/26/2020   Procedure: ESOPHAGOGASTRODUODENOSCOPY (EGD);  Surgeon: Arta Silence, MD;  Location: [facility];  Service: Endoscopy;  Laterality: N/A;  . ESOPHAGOGASTRODUODENOSCOPY (EGD) WITH PROPOFOL N/A  08/02/2019   Procedure: ESOPHAGOGASTRODUODENOSCOPY (EGD) WITH PROPOFOL;  Surgeon: Otis Brace, MD;  Location: [facility];  Service: Gastroenterology;  Laterality: N/A;  . HEMOSTASIS CLIP PLACEMENT  08/02/2019   Procedure: HEMOSTASIS CLIP PLACEMENT;  Surgeon: Otis Brace, MD;  Location: [facility];  Service: Gastroenterology;;  . POLYPECTOMY  07/30/2019   Procedure: POLYPECTOMY;  Surgeon: Otis Brace, MD;  Location: [facility];  Service:  Gastroenterology;;  . POLYPECTOMY  08/02/2019   Procedure: POLYPECTOMY;  Surgeon: Otis Brace, MD;  Location: [facility];  Service: Gastroenterology;;   Past Surgical History:  Procedure Laterality Date  . BIOPSY  07/27/2019   Procedure: BIOPSY;  Surgeon: Clarene Essex, MD;  Location: [facility];  Service: Endoscopy;;  . BIOPSY  07/30/2019   Procedure: BIOPSY;  Surgeon: Otis Brace, MD;  [facility]: [facility];  Service: Gastroenterology;;  . CATARACT EXTRACTION  01/2014  . CHOLECYSTECTOMY    . COLONOSCOPY WITH PROPOFOL N/A 07/30/2019   Procedure: COLONOSCOPY WITH PROPOFOL;  Surgeon: Otis Brace, MD;  [facility]: [facility];  Service: Gastroenterology;  Laterality: N/A;  . ESOPHAGOGASTRODUODENOSCOPY N/A 07/27/2019   Procedure: ESOPHAGOGASTRODUODENOSCOPY (EGD);  Surgeon: Clarene Essex, MD;  Location: [facility];  Service: Endoscopy;  Laterality: N/A;  . ESOPHAGOGASTRODUODENOSCOPY N/A 07/26/2020   Procedure: ESOPHAGOGASTRODUODENOSCOPY (EGD);  Surgeon: Arta Silence, MD;  Location: [facility];  Service: Endoscopy;  Laterality: N/A;  . ESOPHAGOGASTRODUODENOSCOPY (EGD) WITH PROPOFOL N/A 08/02/2019   Procedure: ESOPHAGOGASTRODUODENOSCOPY (EGD) WITH PROPOFOL;  Surgeon: Otis Brace, MD;  Location: [facility];  Service: Gastroenterology;  Laterality: N/A;  . HEMOSTASIS CLIP PLACEMENT  08/02/2019   Procedure: HEMOSTASIS CLIP PLACEMENT;  Surgeon: Otis Brace, MD;  Location: [facility];  Service: Gastroenterology;;  . POLYPECTOMY  07/30/2019   Procedure: POLYPECTOMY;  Surgeon: Otis Brace, MD;  Location: [facility];  Service: Gastroenterology;;  . POLYPECTOMY  08/02/2019   Procedure: POLYPECTOMY;  Surgeon: Otis Brace, MD;  Location: [facility];  Service: Gastroenterology;;   Past Medical History:  Diagnosis Date  . Acute back pain with sciatica, left   . Acute back pain with sciatica, right   . AKI (acute kidney injury) ([place])   . Anemia, unspecified    . Cancer ([place])   . Carcinoid tumor of duodenum   . Chest pain with normal coronary angiography 2019  . Chronic kidney disease, stage 3b ([place])   . Chronic pain   . Chronic systolic CHF (congestive heart failure) ([place])   . Diabetes mellitus   . DKA (diabetic ketoacidosis) ([place])   . Drug-seeking behavior    21 hospitalizations and 14 CT a/p in 2 years for N/V and abdominal pain, demanding only IV dilaudid  . Elevated troponin    chronic  . Esophageal reflux   . Fibromyalgia   . Gastric ulcer   . Gastroparesis   . Gout   . Hyperlipidemia   . Hypertension   . Hypokalemia   . Hypomagnesemia   . Lumbosacral stenosis   . LVH (left ventricular hypertrophy)   . Morbid obesity ([place])   . NICM (nonischemic cardiomyopathy) ([place])   . PAF (paroxysmal atrial fibrillation) ([place])   . Stroke ([place]) 02/2011  . Thrombocytosis   . Vitamin B12 deficiency anemia    BP 112/71   Pulse 90   LMP 10/10/2012   SpO2 97%   Opioid Risk Score:   Fall Risk Score:  `1  Depression screen [facility] 2/9  Depression screen [facility] 2/9 08/01/2020 06/05/2020 01/04/2020 11/15/2019 11/14/2019 10/10/2019 09/20/2019  Decreased Interest 1 1 2 3 1 3  3  Down, Depressed, Hopeless 1 1 3 3 1 3 3   PHQ - 2 Score 2 2 5 6 2 6 6   Altered sleeping - - 3 3 - 3 3  Tired, decreased energy - - 3 3 - 3 3  Change in appetite - - 3 3 - 3 3  Feeling bad or failure about yourself  - - 3 3 - 2 2  Trouble concentrating - - 3 3 - 3 3  Moving slowly or fidgety/restless - - 3 3 - 3 1  Suicidal thoughts - - 0 3 - 0 1  PHQ-9 Score - - 23 27 - 23 -  Difficult doing work/chores - - - - - - Very difficult  Some recent data might be hidden      Review of Systems  Gastrointestinal:  Positive for abdominal pain.  Musculoskeletal:  Positive for back pain. Negative for gait problem.  Neurological:  Positive for dizziness, tremors, weakness and numbness.       Tingling  All other systems reviewed and are negative.     Objective:   Physical  Exam Vitals and nursing note reviewed.  Constitutional:      Appearance: Normal appearance.  Cardiovascular:     Rate and Rhythm: Normal rate and regular rhythm.     Pulses: Normal pulses.     Heart sounds: Normal heart sounds.  Pulmonary:     Effort: Pulmonary effort is normal.     Breath sounds: Normal breath sounds.  Musculoskeletal:     Cervical back: Normal range of motion and neck supple.     Comments: Normal Muscle Bulk and Muscle Testing Reveals:  Upper Extremities: Full ROM and Muscle Strength 4/5  Lumbar Paraspinal Tenderness: L-4-L-5 Lower Extremities: Decreased ROM and Muscle Strength 4/5 Bilateral Lower extremities Flexion Produces Pain into her bilateral lower extremities Arrived in wheelchair     Skin:    General: Skin is warm and dry.  Neurological:     Mental Status: She is oriented to person, place, and time.  Psychiatric:        Mood and Affect: Mood normal.        Behavior: Behavior normal.         Assessment & Plan:  1. 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 01/08/2021 2. Chronic Pain Syndrome: Refilled Dilaudid 4 mg one tablet twice a day as needed for sever pain. #60. We will continue the opioid monitoring program, this consists of regular clinic visits, examinations, urine drug screen, pill counts as well as use of [place] Controlled Substance Reporting system. A 12 month History has been reviewed on the [place] Controlled Substance Reporting System on 01/08/2021. 3. Chronic Bilateral Lower Back Pain without sciatica: Continue HEP as Tolerated. Continue to Monitor.   F/U with Dr Ranell Patrick in 1 month

## 2021-01-23 ENCOUNTER — Other Ambulatory Visit: Payer: Self-pay | Admitting: Family Medicine

## 2021-01-23 IMAGING — DX DG CHEST 1V PORT
1 series · 1 of 1 positions shown · non-contrast
Comparison: Radiographs 07/19/2018 07/17/2018.

CLINICAL DATA: Chest pain radiating to the right side today.
[name]. History of hypertension and diabetes.

EXAM:
PORTABLE CHEST 1 VIEW

[chest ap]
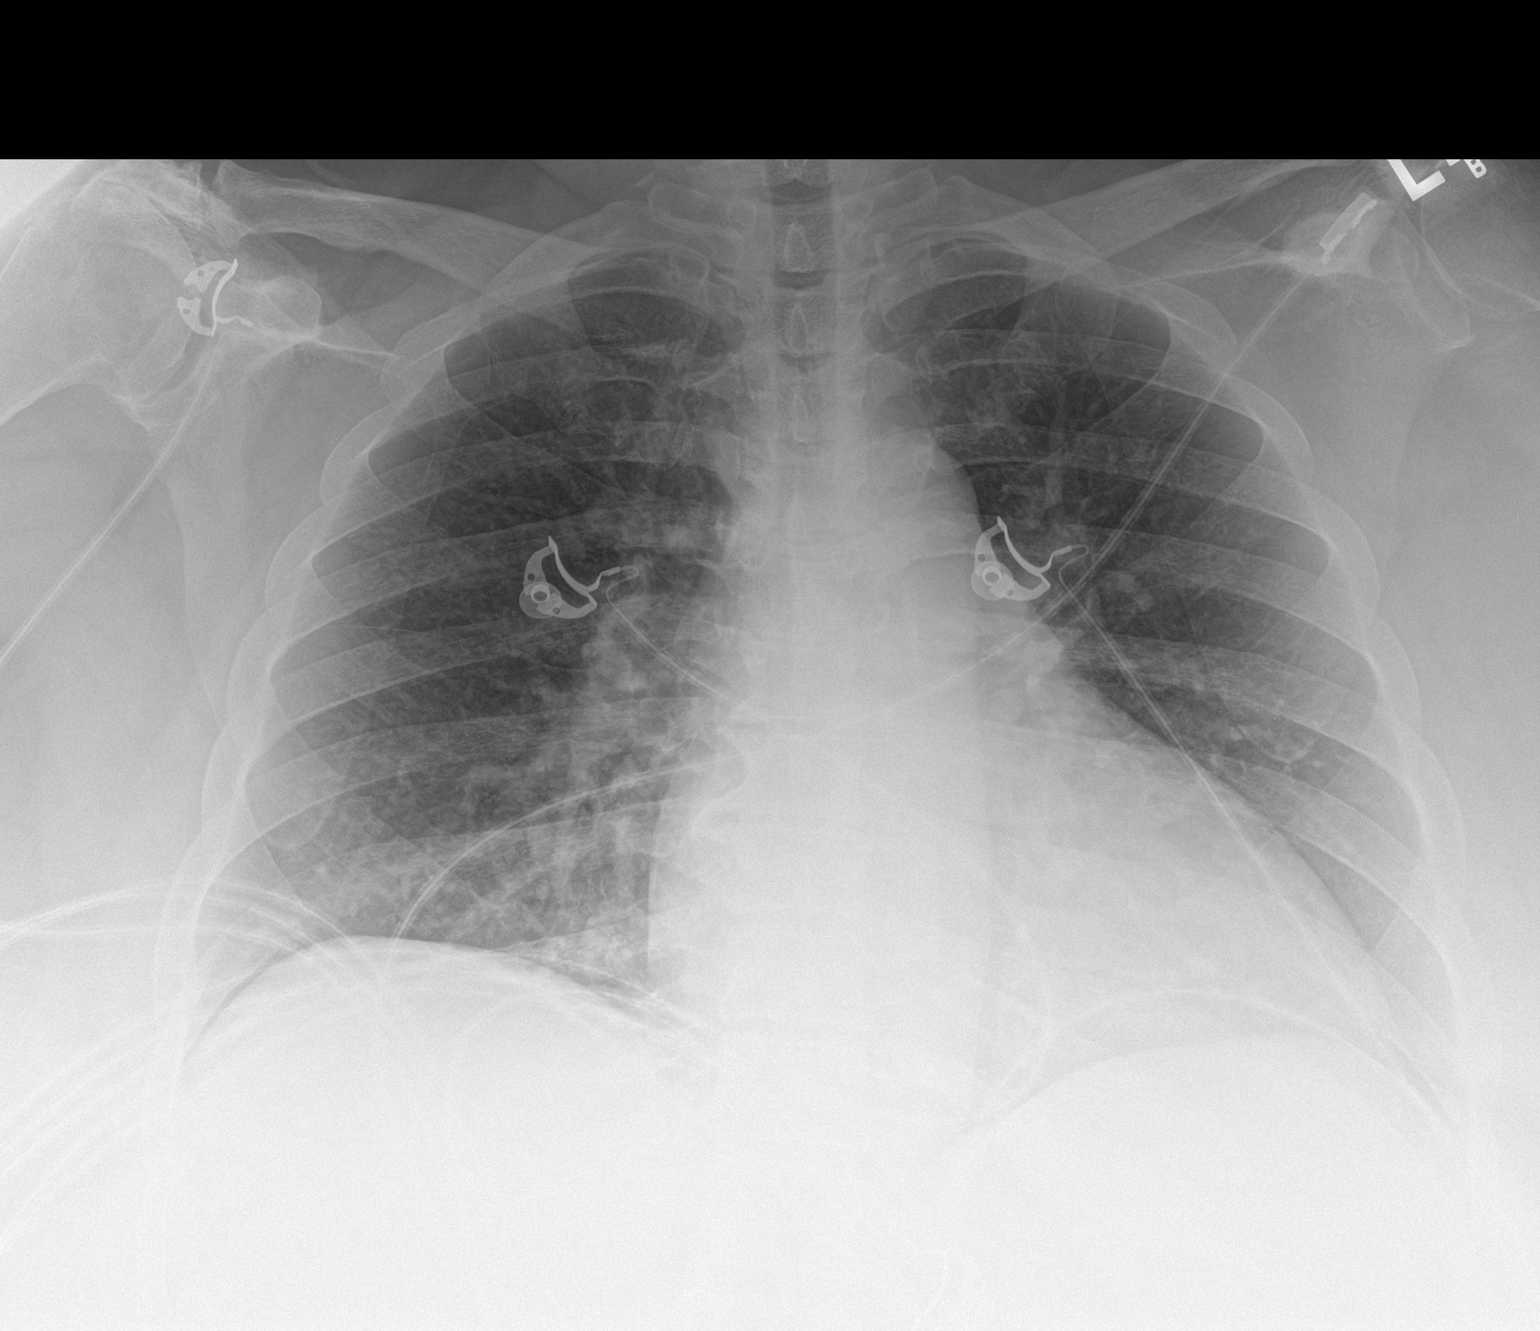

[1 of 1 positions shown; findings below may reference images not displayed]

FINDINGS: 2010 hours. The heart size is stable at the upper limits of normal.
The lungs are clear. There is no pleural effusion or pneumothorax.
Degenerative changes are present throughout the spine. There is
evidence of chronic rotator cuff tears bilaterally with asymmetric
glenohumeral arthropathy on the right. Telemetry leads overlie the
chest.
IMPRESSION: Stable borderline heart size.  No active cardiopulmonary process.

## 2021-01-25 IMAGING — CT CT ABD-PELV W/O CM
2 of 4 series · 17 of 46 positions shown, 19 images · non-contrast
Comparison: July 17, 2018

CLINICAL DATA: Diffuse abdominal pain. Nausea vomiting.

EXAM:
CT ABDOMEN AND PELVIS WITHOUT CONTRAST
TECHNIQUE: Multidetector CT imaging of the abdomen and pelvis was performed
following the standard protocol without IV contrast.

[Series 2: axial st · axial · 0.90mm/px · z∈[-468,-43]mm · 14 of 99 slices shown, 16 images]
[im 7/99  soft-tissue]
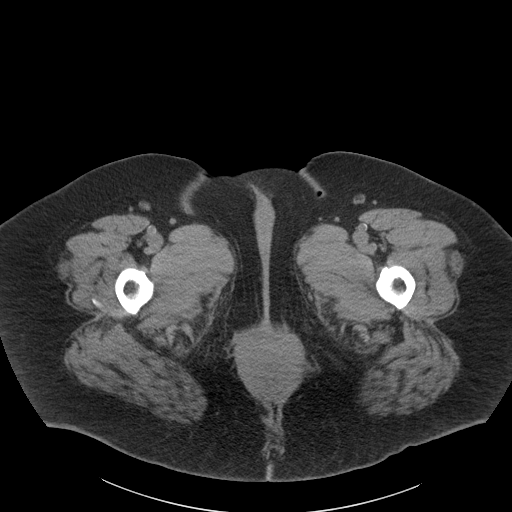
[im 7/99  bone]
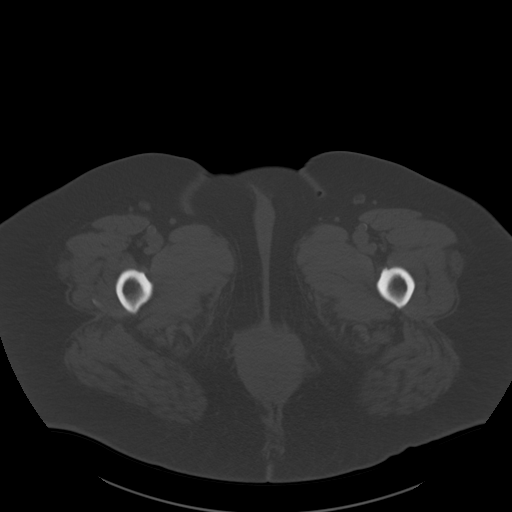
[im 13/99  soft-tissue]
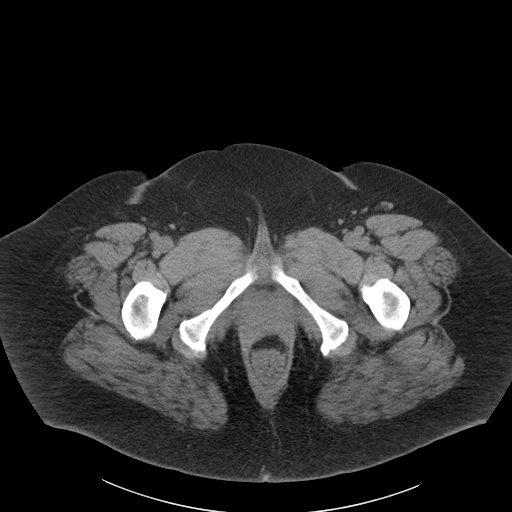
[im 19/99  soft-tissue]
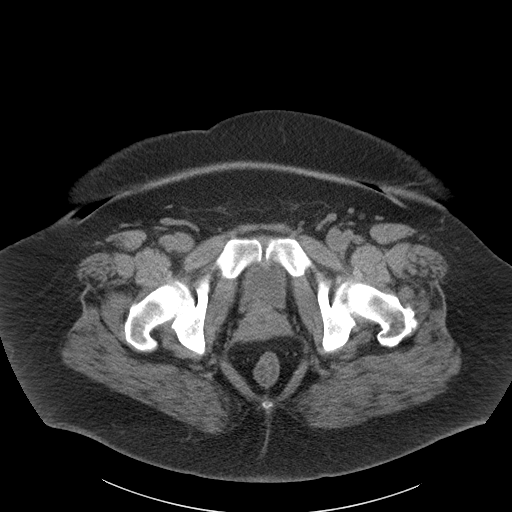
[im 25/99  soft-tissue]
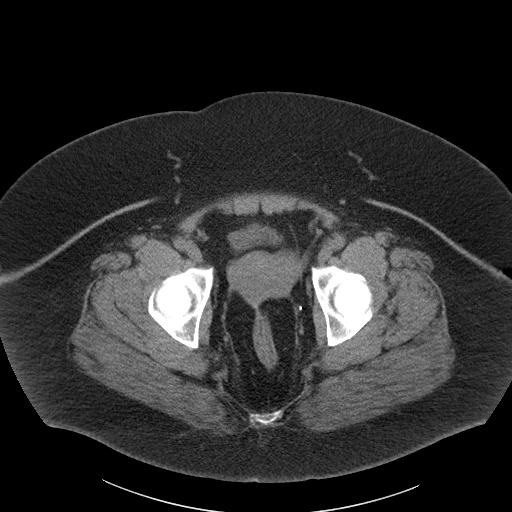
[im 31/99  soft-tissue]
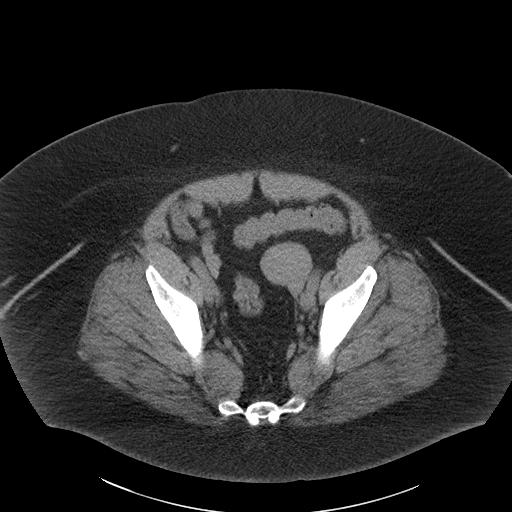
[im 37/99  soft-tissue]
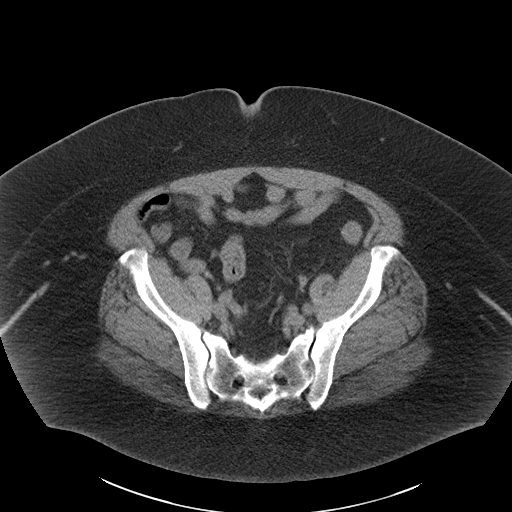
[im 43/99  soft-tissue]
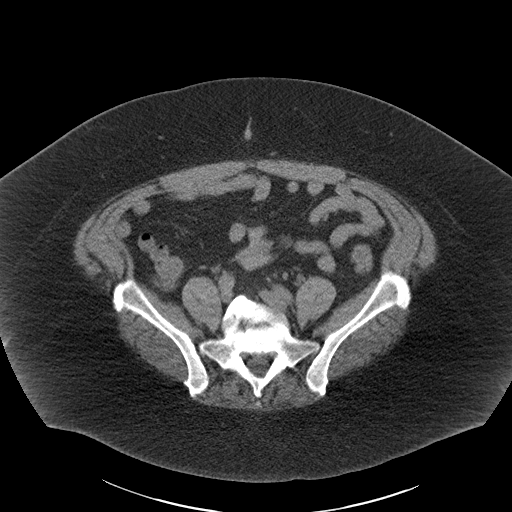
[im 56/99  soft-tissue]
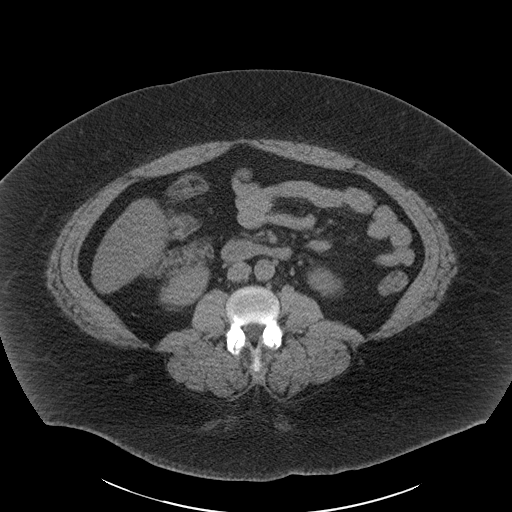
[im 62/99  soft-tissue]
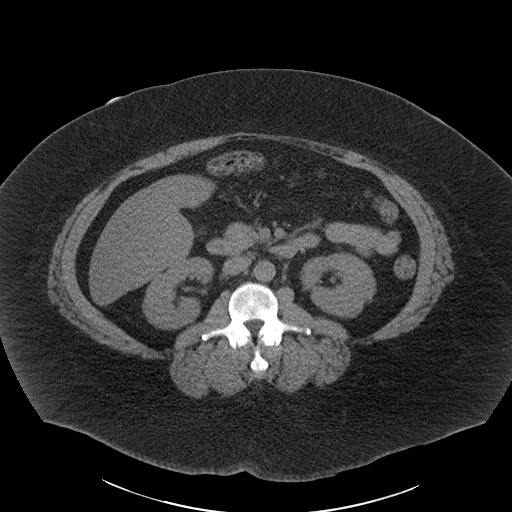
[im 62/99  bone]
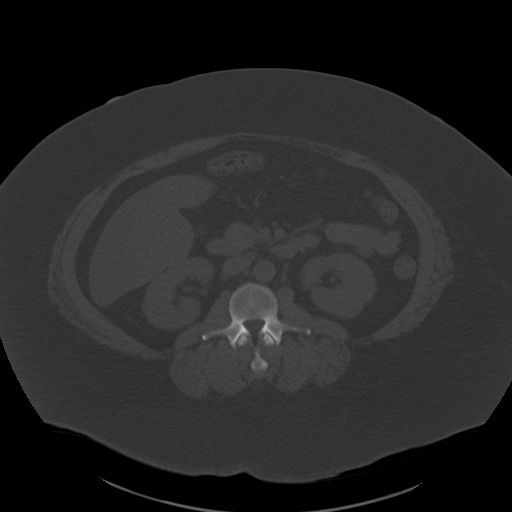
[im 68/99  soft-tissue]
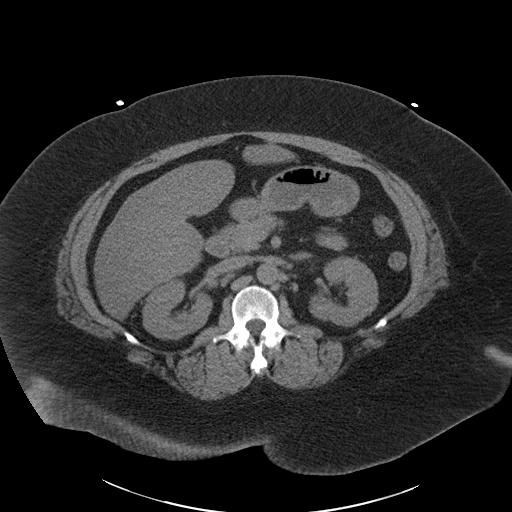
[im 74/99  soft-tissue]
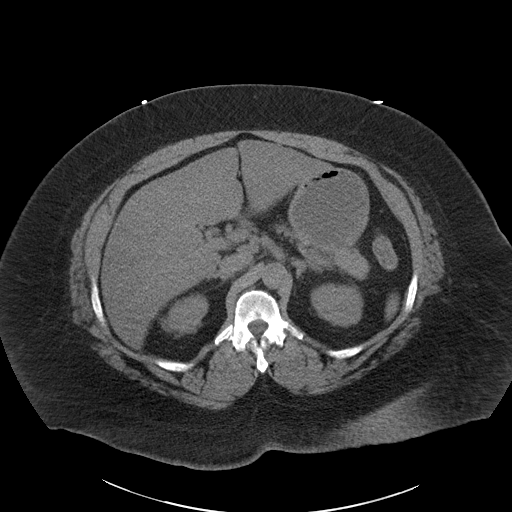
[im 80/99  soft-tissue]
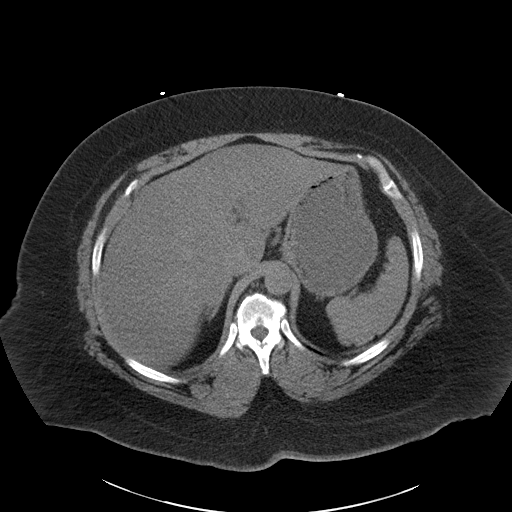
[im 86/99  soft-tissue]
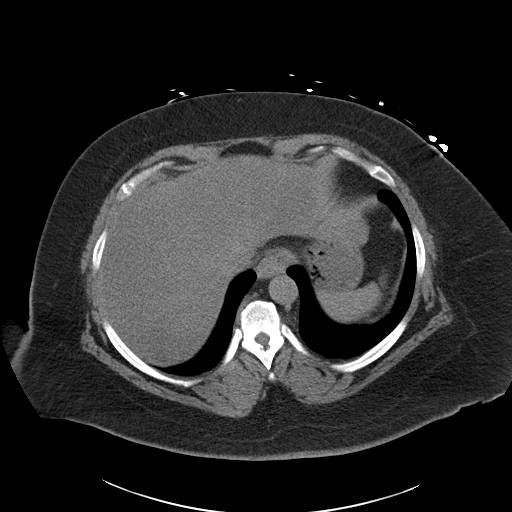
[im 92/99  soft-tissue]
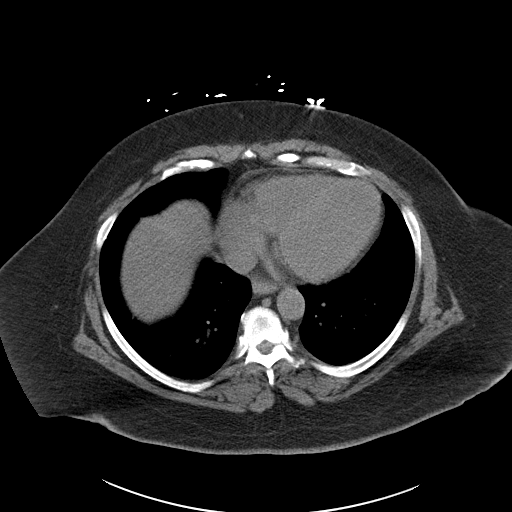

[Series 4: coronal st · coronal · 1.02mm/px · 3 of 167 slices shown]
[im 56/167  soft-tissue]
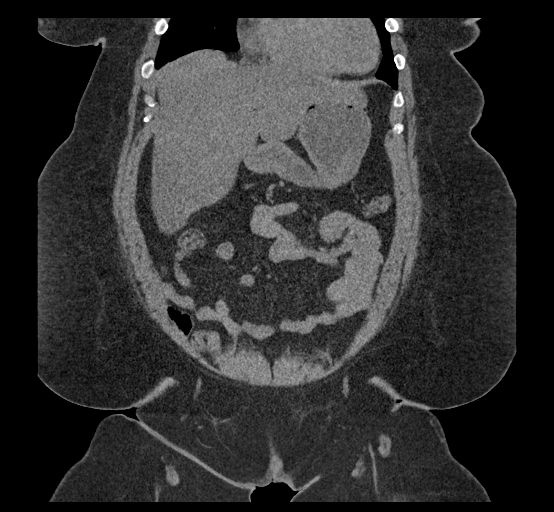
[im 74/167  soft-tissue]
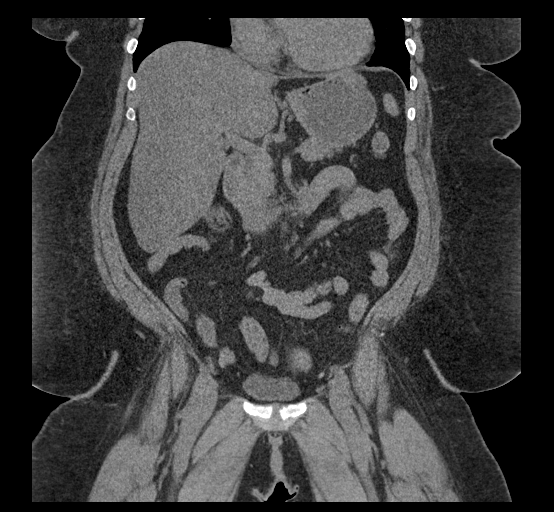
[im 93/167  soft-tissue]
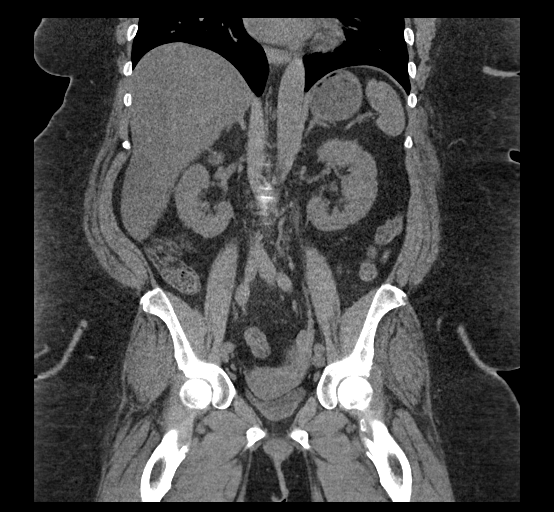

[17 of 46 positions shown; findings below may reference images not displayed]

FINDINGS: Lower chest: No acute abnormality.

Hepatobiliary: Hepatic steatosis. Post cholecystectomy.

Pancreas: Unremarkable. No pancreatic ductal dilatation or
surrounding inflammatory changes.

Spleen: Normal in size without focal abnormality.

Adrenals/Urinary Tract: Adrenal glands are unremarkable. Kidneys are
normal, without renal calculi, focal lesion, or hydronephrosis.
Bladder is unremarkable.

Stomach/Bowel: Stomach is within normal limits. No evidence of
appendicitis. No evidence of bowel wall thickening, distention, or
inflammatory changes.

Vascular/Lymphatic: No significant vascular findings are present. No
enlarged abdominal or pelvic lymph nodes.

Reproductive: 5.1 x 4.5 cm left adnexal mass.

Other: No abdominal wall hernia or abnormality. No abdominopelvic
ascites.

Musculoskeletal: L5-S1 spondylosis.
IMPRESSION: 1. Hepatic steatosis.
2. No evidence of acute abnormalities within the solid abdominal
organs.
3. 5.1 x 4.5 cm left pelvic mass corresponds to a exophytic fibroid,
in correlation to MRI dated November 29, 2017.

## 2021-01-25 IMAGING — DX DG CHEST 1V PORT
1 series · 1 of 1 positions shown · non-contrast
Comparison: Chest x-ray dated December 15, 2018.

CLINICAL DATA: Nausea and vomiting.

EXAM:
PORTABLE CHEST 1 VIEW

[chest ap]
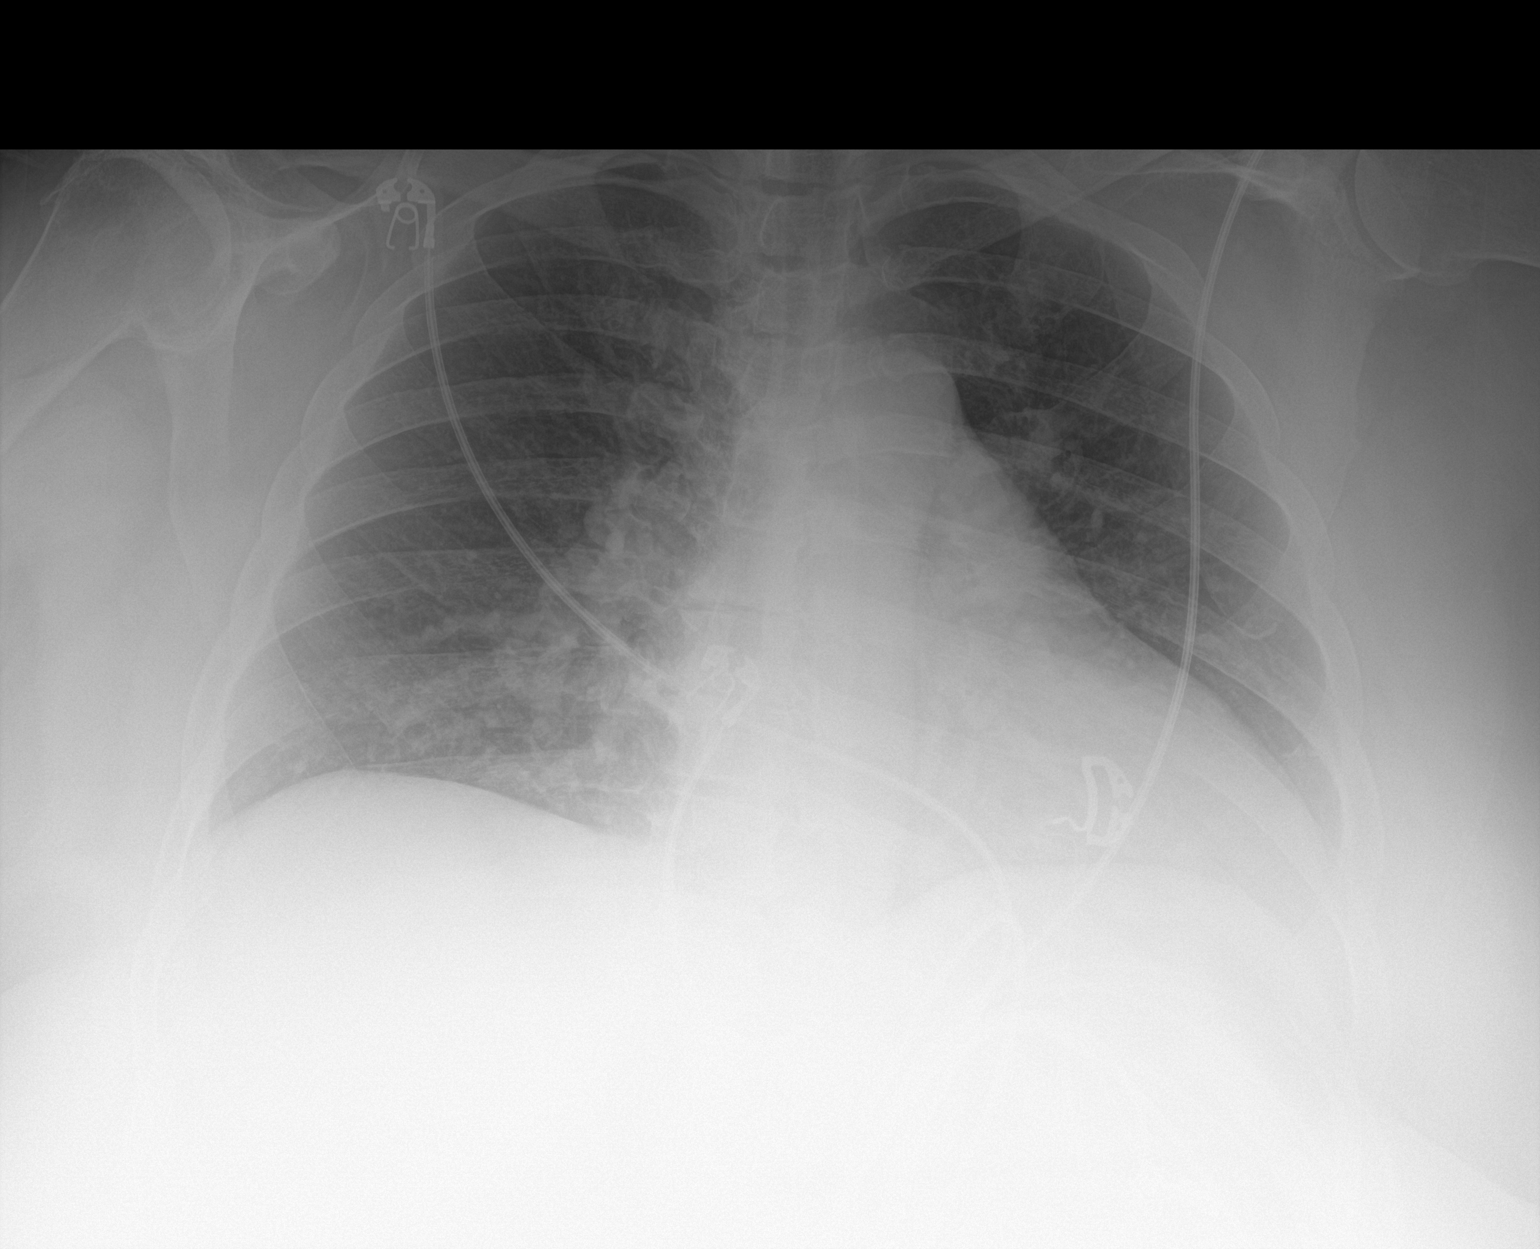

[1 of 1 positions shown; findings below may reference images not displayed]

FINDINGS: The heart remains at the upper limits of normal in size. Normal
pulmonary vascularity. No focal consolidation, pleural effusion, or
pneumothorax. No acute osseous abnormality.
IMPRESSION: No active disease.

## 2021-01-30 ENCOUNTER — Encounter (HOSPITAL_COMMUNITY): Payer: Self-pay | Admitting: Emergency Medicine

## 2021-01-30 ENCOUNTER — Emergency Department (HOSPITAL_COMMUNITY)
Admission: EM | Admit: 2021-01-30 | Discharge: 2021-01-31 | Disposition: A | Discharge status: Home / Self Care | Payer: Medicare Other | Attending: Emergency Medicine | Admitting: Emergency Medicine

## 2021-01-30 DIAGNOSIS — J449 Chronic obstructive pulmonary disease, unspecified: Secondary | ICD-10-CM | POA: Insufficient documentation

## 2021-01-30 DIAGNOSIS — I5022 Chronic systolic (congestive) heart failure: Secondary | ICD-10-CM | POA: Insufficient documentation

## 2021-01-30 DIAGNOSIS — R000 Tachycardia, unspecified: Secondary | ICD-10-CM | POA: Diagnosis not present

## 2021-01-30 DIAGNOSIS — D75839 Thrombocytosis, unspecified: Secondary | ICD-10-CM | POA: Insufficient documentation

## 2021-01-30 DIAGNOSIS — R109 Unspecified abdominal pain: Secondary | ICD-10-CM | POA: Diagnosis present

## 2021-01-30 DIAGNOSIS — Z79899 Other long term (current) drug therapy: Secondary | ICD-10-CM | POA: Diagnosis not present

## 2021-01-30 DIAGNOSIS — R1010 Upper abdominal pain, unspecified: Secondary | ICD-10-CM

## 2021-01-30 DIAGNOSIS — Z85038 Personal history of other malignant neoplasm of large intestine: Secondary | ICD-10-CM | POA: Diagnosis not present

## 2021-01-30 DIAGNOSIS — Z7901 Long term (current) use of anticoagulants: Secondary | ICD-10-CM | POA: Insufficient documentation

## 2021-01-30 DIAGNOSIS — N1832 Chronic kidney disease, stage 3b: Secondary | ICD-10-CM | POA: Insufficient documentation

## 2021-01-30 DIAGNOSIS — E1122 Type 2 diabetes mellitus with diabetic chronic kidney disease: Secondary | ICD-10-CM | POA: Diagnosis not present

## 2021-01-30 DIAGNOSIS — D649 Anemia, unspecified: Secondary | ICD-10-CM | POA: Insufficient documentation

## 2021-01-30 DIAGNOSIS — I130 Hypertensive heart and chronic kidney disease with heart failure and stage 1 through stage 4 chronic kidney disease, or unspecified chronic kidney disease: Secondary | ICD-10-CM | POA: Insufficient documentation

## 2021-01-30 DIAGNOSIS — N289 Disorder of kidney and ureter, unspecified: Secondary | ICD-10-CM | POA: Insufficient documentation

## 2021-01-30 DIAGNOSIS — N390 Urinary tract infection, site not specified: Secondary | ICD-10-CM | POA: Diagnosis not present

## 2021-01-30 NOTE — ED Triage Notes (Signed)
Reports sudden onset of lower abdominal pain that radiates from her LLQ to her L chest and arm. 12 lead unremarkable with EMS. Has been vomiting for 2 days.

## 2021-01-31 ENCOUNTER — Emergency Department (HOSPITAL_COMMUNITY): Payer: Medicare Other

## 2021-01-31 DIAGNOSIS — N289 Disorder of kidney and ureter, unspecified: Secondary | ICD-10-CM | POA: Diagnosis not present

## 2021-01-31 LAB — URINALYSIS, ROUTINE W REFLEX MICROSCOPIC
Bilirubin Urine: NEGATIVE
Glucose, UA: 50 mg/dL — AB
Hgb urine dipstick: NEGATIVE
Ketones, ur: 5 mg/dL — AB
Nitrite: NEGATIVE
Protein, ur: >=300 mg/dL — AB
Specific Gravity, Urine: 1.019 (ref 1.005–1.030)
Squamous Epithelial / HPF: >50 — ABNORMAL HIGH (ref 0–5)
pH: 5 (ref 5.0–8.0)

## 2021-01-31 LAB — LIPASE, BLOOD: Lipase: 25 U/L (ref 11–51)

## 2021-01-31 LAB — TROPONIN I (HIGH SENSITIVITY)
Troponin I (High Sensitivity): 15 ng/L (ref <18)
Troponin I (High Sensitivity): 13 ng/L (ref <18)

## 2021-01-31 LAB — CBC
HCT: 26 % — ABNORMAL LOW (ref 36.0–46.0)
Hemoglobin: 7.8 g/dL — ABNORMAL LOW (ref 12.0–15.0)
MCH: 28.3 pg (ref 26.0–34.0)
MCHC: 30 g/dL (ref 30.0–36.0)
MCV: 94.2 fL (ref 80.0–100.0)
Platelets: 430 10*3/uL — ABNORMAL HIGH (ref 150–400)
RBC: 2.76 MIL/uL — ABNORMAL LOW (ref 3.87–5.11)
RDW: 15.2 % (ref 11.5–15.5)
WBC: 6.8 10*3/uL (ref 4.0–10.5)
nRBC: 0 % (ref 0.0–0.2)

## 2021-01-31 LAB — COMPREHENSIVE METABOLIC PANEL
ALT: 8 U/L (ref 0–44)
AST: 11 U/L — ABNORMAL LOW (ref 15–41)
Albumin: 3.2 g/dL — ABNORMAL LOW (ref 3.5–5.0)
Alkaline Phosphatase: 82 U/L (ref 38–126)
Anion gap: 11 (ref 5–15)
BUN: 20 mg/dL (ref 6–20)
CO2: 19 mmol/L — ABNORMAL LOW (ref 22–32)
Calcium: 8.5 mg/dL — ABNORMAL LOW (ref 8.9–10.3)
Chloride: 105 mmol/L (ref 98–111)
Creatinine, Ser: 2.08 mg/dL — ABNORMAL HIGH (ref 0.44–1.00)
GFR, Estimated: 27 mL/min — ABNORMAL LOW (ref >60)
Glucose, Bld: 220 mg/dL — ABNORMAL HIGH (ref 70–99)
Potassium: 3.6 mmol/L (ref 3.5–5.1)
Sodium: 135 mmol/L (ref 135–145)
Total Bilirubin: 0.8 mg/dL (ref 0.3–1.2)
Total Protein: 7.5 g/dL (ref 6.5–8.1)

## 2021-01-31 MED ORDER — MORPHINE SULFATE (PF) 4 MG/ML IV SOLN
4.0000 mg | Freq: Once | INTRAVENOUS | Status: AC
  Administered 2021-01-31: 4 mg via INTRAVENOUS
  Filled 2021-01-31: qty 1

## 2021-01-31 MED ORDER — TRIMETHOBENZAMIDE HCL 100 MG/ML IM SOLN
200.0000 mg | Freq: Once | INTRAMUSCULAR | Status: AC
  Administered 2021-01-31: 200 mg via INTRAMUSCULAR
  Filled 2021-01-31: qty 2

## 2021-01-31 MED ORDER — ONDANSETRON HCL 4 MG/2ML IJ SOLN
4.0000 mg | Freq: Once | INTRAMUSCULAR | Status: AC
  Administered 2021-01-31: 4 mg via INTRAVENOUS
  Filled 2021-01-31: qty 2

## 2021-01-31 MED ORDER — HYDROMORPHONE HCL 1 MG/ML IJ SOLN
1.0000 mg | Freq: Once | INTRAMUSCULAR | Status: AC
  Administered 2021-01-31: 1 mg via INTRAVENOUS
  Filled 2021-01-31: qty 1

## 2021-01-31 MED ORDER — PANTOPRAZOLE SODIUM 40 MG IV SOLR
40.0000 mg | Freq: Once | INTRAVENOUS | Status: DC
  Filled 2021-01-31: qty 40

## 2021-01-31 MED ORDER — DIPHENHYDRAMINE HCL 50 MG/ML IJ SOLN: 50.0000 mg | Freq: Once | INTRAMUSCULAR | Status: DC

## 2021-01-31 MED ORDER — TRIMETHOBENZAMIDE HCL 300 MG PO CAPS: 300.0000 mg | ORAL_CAPSULE | Freq: Three times a day (TID) | ORAL | 0 refills | Status: DC | PRN

## 2021-01-31 MED ORDER — DIPHENHYDRAMINE HCL 25 MG PO CAPS: 50.0000 mg | ORAL_CAPSULE | Freq: Once | ORAL | Status: DC

## 2021-01-31 MED ORDER — CEPHALEXIN 500 MG PO CAPS: 500.0000 mg | ORAL_CAPSULE | Freq: Two times a day (BID) | ORAL | 0 refills | Status: AC

## 2021-01-31 MED ORDER — HYDROMORPHONE HCL 1 MG/ML IJ SOLN
1.0000 mg | Freq: Once | INTRAMUSCULAR | Status: AC
  Administered 2021-01-31: 1 mg via SUBCUTANEOUS
  Filled 2021-01-31: qty 1

## 2021-01-31 MED ORDER — HYDROCORTISONE SOD SUC (PF) 250 MG IJ SOLR
200.0000 mg | Freq: Once | INTRAMUSCULAR | Status: DC
  Filled 2021-01-31: qty 200

## 2021-01-31 MED ORDER — HYDROMORPHONE HCL 1 MG/ML IJ SOLN
1.0000 mg | Freq: Once | INTRAMUSCULAR | Status: AC
  Administered 2021-01-31: 1 mg via SUBCUTANEOUS

## 2021-01-31 MED ORDER — HYDROMORPHONE HCL 1 MG/ML IJ SOLN
1.0000 mg | Freq: Once | INTRAMUSCULAR | Status: DC
  Filled 2021-01-31: qty 1

## 2021-01-31 MED ORDER — SUCRALFATE 1 G PO TABS
1.0000 g | ORAL_TABLET | Freq: Once | ORAL | Status: AC
  Administered 2021-01-31: 1 g via ORAL
  Filled 2021-01-31: qty 1

## 2021-01-31 MED ORDER — CEPHALEXIN 500 MG PO CAPS: 500.0000 mg | ORAL_CAPSULE | Freq: Two times a day (BID) | ORAL | 0 refills | Status: DC

## 2021-01-31 NOTE — Discharge Instructions (Addendum)
Please follow up with your PCP and pain specialist regarding your chronic pain.   There are many causes of abdominal pain. Most pain is not serious and goes away, but some pain gets worse, changes, or will not go away. Please return to the emergency department or see your doctor right away if you (or your family member) experience any of the following:  1. Pain that gets worse or moves to just one spot.  2. Pain that gets worse if you cough or sneeze.  3. Pain with going over a bump in the road.  4. Pain that does not get better in 24 hours.  5. Inability to keep down liquids (vomiting)-especially if you are making less urine.  6. Fainting.  7. Blood in the vomit or stool.  8. High fever or shaking chills.  9. Swelling of the abdomen.  10. Any new or worsening problem.      Follow-up Instructions  See your primary care provider if not completely better in the next 2-3 days. Come to the ED if you are unable to see them in this time frame.    Additional Instructions  No alcohol.  No caffeine, aspirin, or cigarettes.   Please return to the emergency department immediately for any new or concerning symptoms, or if you get worse.

## 2021-01-31 NOTE — ED Notes (Signed)
Spoke with CT. They recommend waiting on pt's labs prior to starting the prep.

## 2021-01-31 NOTE — ED Provider Notes (Signed)
Emergency Medicine Provider Triage Evaluation Note  Deborah [name] , a 55 y.o. female  was evaluated in triage.  Pt complains of an onset, constant, diffuse, abdominal pain radiating into her substernal chest area and right shoulder.  She reports that she began having nausea and nonbloody nonbilious emesis yesterday.  She denies any diarrhea.  She does report history of recent duodenal cancer status postresection.  Not on chemo or radiation.  Review of Systems  Positive: + abd pain, chest pain, nausea, vomiting, SOB Negative: - diarrhea, fevers  Physical Exam  BP (!) 129/93 (BP Location: Right Arm)   Pulse (!) 106   Temp 98.2 F (36.8 C) (Oral)   Resp (!) 22   Ht 5\' 6"  (1.676 m)   Wt 136.1 kg   LMP 10/10/2012   SpO2 100%   BMI 48.42 kg/m  Gen:   Awake, no distress   Resp:  Normal effort  MSK:   Moves extremities without difficulty  Other:  + diffuse abdominal TTP with voluntary guarding. No rebound. LCTAB.   Medical Decision Making  Medically screening exam initiated at 12:16 AM.  Appropriate orders placed.  TAESHA GOODELL was informed that the remainder of the evaluation will be completed by another provider, this initial triage assessment does not replace that evaluation, and the importance of remaining in the ED until their evaluation is complete.     Eustaquio Maize, PA-C 63/33/54 [id number]    Delora Fuel, MD 63/89/37 [phone]

## 2021-01-31 NOTE — ED Provider Notes (Signed)
Provider Note MRN:  [id number]  Arrival date & time: 01/31/21    ED Course and Medical Decision Making  Assumed care from Dr Roxanne Mins at shift change.  See not from prior team for complete details, in brief: hx malignancy, anticoagulated. Abdominal pain; CT stable, labs stable. Receiving dilaudid, pending UA  Plan per prior physician plan discharge home if able to control pain adequately.  Patient did require multiple doses of analgesics, she takes high doses of Dilaudid at home.  Pain able to be controlled with subcutaneous Dilaudid in the ER.  Patient was given Tigan for nausea which has resolved her nausea.  She is able tolerate oral intake without much difficulty.  Patient found to have urinary tract infection, she has no significant urinary symptoms.  CT without evidence of stone or pyelonephritis.  Given her nausea and chronic abdominal pain we will go ahead and treat her with oral Keflex which she has tolerated in the past per discussion with pharmacy.  Advised patient follow-up with her pain control specialist regarding her ongoing chronic abdominal pain.  The patient improved significantly and was discharged in stable condition. Detailed discussions were had with the patient regarding current findings, and need for close f/u with PCP or on call doctor. The patient has been instructed to return immediately if the symptoms worsen in any way for re-evaluation. Patient verbalized understanding and is in agreement with current care plan. All questions answered prior to discharge.    Procedures  Final Clinical Impressions(s) / ED Diagnoses     ICD-10-CM   1. Upper abdominal pain  R10.10     2. Normochromic normocytic anemia  D64.9     3. Renal insufficiency  N28.9     4. Thrombocytosis  D75.839     5. Urinary tract infection without hematuria, site unspecified  N39.0       ED Discharge Orders          Ordered    cephALEXin (KEFLEX) 500 MG capsule  2 times daily        01/31/21  1243    trimethobenzamide (TIGAN) 300 MG capsule  3 times daily PRN        01/31/21 1243              Discharge Instructions      Please follow up with your PCP and pain specialist regarding your chronic pain.   There are many causes of abdominal pain. Most pain is not serious and goes away, but some pain gets worse, changes, or will not go away. Please return to the emergency department or see your doctor right away if you (or your family member) experience any of the following:  1. Pain that gets worse or moves to just one spot.  2. Pain that gets worse if you cough or sneeze.  3. Pain with going over a bump in the road.  4. Pain that does not get better in 24 hours.  5. Inability to keep down liquids (vomiting)-especially if you are making less urine.  6. Fainting.  7. Blood in the vomit or stool.  8. High fever or shaking chills.  9. Swelling of the abdomen.  10. Any new or worsening problem.      Follow-up Instructions  See your primary care provider if not completely better in the next 2-3 days. Come to the ED if you are unable to see them in this time frame.    Additional Instructions  No alcohol.  No caffeine, aspirin,  or cigarettes.   Please return to the emergency department immediately for any new or concerning symptoms, or if you get worse.          Jeanell Sparrow, DO 01/31/21 1253

## 2021-01-31 NOTE — ED Provider Notes (Signed)
[name]   [name]: [id number] Arrival date & time: 01/30/21  2349     History Chief Complaint  Patient presents with   Abdominal Pain    [name] is a 55 y.o. female.  The history is provided by the patient.  Abdominal Pain She has history of hypertension, diabetes, hyperlipidemia, systolic heart failure, paroxysmal atrial fibrillation anticoagulated on apixaban, stroke, malignant carcinoid tumor of the duodenum and comes in with sudden onset of severe pain across the lower abdomen which radiates up into the chest and right shoulder and neck.  Pain is rated at 10/10.  There is associated nausea and vomiting.  There is slight improvement in the pain for a brief period of time following emesis.  She denies having had a bowel movement since onset of pain and has not passed any flatus.  There is no radiation of pain to the back.  She has not done anything to treat her pain.   Past Medical History:  Diagnosis Date   Acute back pain with sciatica, left    Acute back pain with sciatica, right    AKI (acute kidney injury) ([place])    Anemia, unspecified    Cancer (HCC)    Carcinoid tumor of duodenum    Chest pain with normal coronary angiography 2019   Chronic kidney disease, stage 3b (HCC)    Chronic pain    Chronic systolic CHF (congestive heart failure) ([place])    Diabetes mellitus    DKA (diabetic ketoacidosis) ([place])    Drug-seeking behavior    21 hospitalizations and 14 CT a/p in 2 years for N/V and abdominal pain, demanding only IV dilaudid   Elevated troponin    chronic   Esophageal reflux    Fibromyalgia    Gastric ulcer    Gastroparesis    Gout    Hyperlipidemia    Hypertension    Hypokalemia    Hypomagnesemia    Lumbosacral stenosis    LVH (left ventricular hypertrophy)    Morbid obesity (HCC)    NICM (nonischemic cardiomyopathy) (HCC)    PAF (paroxysmal atrial fibrillation) ([place])    Stroke ([place]) 02/2011    Thrombocytosis    Vitamin B12 deficiency anemia     Patient Active Problem List   Diagnosis Date Noted   Elevated troponin 11/21/2020   Nausea and vomiting 11/07/2020   Nausea & vomiting 11/06/2020   PAF (paroxysmal atrial fibrillation) (HCC)    Prolonged QT interval    Carcinoid tumor of duodenum    Non-alcoholic cirrhosis (HCC)    Thrombocytosis    Acute renal failure superimposed on stage 3b chronic kidney disease ([place])    ARF (acute renal failure) ([place]) 07/21/2020   Vaginal yeast infection 01/04/2020   AKI (acute kidney injury) ([place]) 11/27/2019   Chronic pain 09/04/2019   Malignant carcinoid tumor of duodenum ([place])    Nonalcoholic fatty liver disease 06/05/2019   Chronic diarrhea    COPD exacerbation ([place]) 05/08/2019   Acute on chronic HFrEF (heart failure with reduced ejection fraction) ([place])    Restrictive lung disease secondary to obesity    History of gastric ulcer    Uncontrolled type 2 diabetes mellitus with hyperglycemia ([place])    Fibroid uterus 35/46/5681   Chronic systolic CHF (congestive heart failure) ([place]) 12/19/2018   Intractable nausea and vomiting 12/17/2018   Hypoxia 12/17/2018   Abdominal pain 07/17/2018   Cardiac arrest ([place]) 11/29/2017   Leukocytosis 11/29/2017   Anxiety 11/29/2017  Stroke (cerebrum) ([place]) 03/19/2017   GERD (gastroesophageal reflux disease) 03/19/2017   Depression 03/19/2017   Morbid obesity ([place])    Normocytic anemia 08/16/2016   Diabetic gastroparesis ([place]) 08/16/2016   Gout 06/05/2016   Chest pain 09/26/2015   Hypokalemia 09/26/2015   Hypomagnesemia 09/26/2015   Uncontrolled type 2 diabetes mellitus with diabetic neuropathy, with long-term current use of insulin 05/25/2015   Type 2 diabetes mellitus with hyperglycemia, with long-term current use of insulin ([place]) 05/25/2015   Chronic kidney disease, stage 3b ([place]) 05/25/2015   Essential hypertension, benign 09/28/2013    Past Surgical History:  Procedure Laterality Date    BIOPSY  07/27/2019   Procedure: BIOPSY;  Surgeon: Clarene Essex, MD;  Location: [facility];  Service: Endoscopy;;   BIOPSY  07/30/2019   Procedure: BIOPSY;  Surgeon: Otis Brace, MD;  Location: [facility];  Service: Gastroenterology;;   CATARACT EXTRACTION  01/2014   CHOLECYSTECTOMY     COLONOSCOPY WITH PROPOFOL N/A 07/30/2019   Procedure: COLONOSCOPY WITH PROPOFOL;  Surgeon: Otis Brace, MD;  Location: [facility];  Service: Gastroenterology;  Laterality: N/A;   ESOPHAGOGASTRODUODENOSCOPY N/A 07/27/2019   Procedure: ESOPHAGOGASTRODUODENOSCOPY (EGD);  Surgeon: Clarene Essex, MD;  Location: [facility];  Service: Endoscopy;  Laterality: N/A;   ESOPHAGOGASTRODUODENOSCOPY N/A 07/26/2020   Procedure: ESOPHAGOGASTRODUODENOSCOPY (EGD);  Surgeon: Arta Silence, MD;  Location: [facility];  Service: Endoscopy;  Laterality: N/A;   ESOPHAGOGASTRODUODENOSCOPY (EGD) WITH PROPOFOL N/A 08/02/2019   Procedure: ESOPHAGOGASTRODUODENOSCOPY (EGD) WITH PROPOFOL;  Surgeon: Otis Brace, MD;  Location: [facility];  Service: Gastroenterology;  Laterality: N/A;   HEMOSTASIS CLIP PLACEMENT  08/02/2019   Procedure: HEMOSTASIS CLIP PLACEMENT;  Surgeon: Otis Brace, MD;  Location: [facility];  Service: Gastroenterology;;   POLYPECTOMY  07/30/2019   Procedure: POLYPECTOMY;  Surgeon: Otis Brace, MD;  Location: [facility];  Service: Gastroenterology;;   POLYPECTOMY  08/02/2019   Procedure: POLYPECTOMY;  Surgeon: Otis Brace, MD;  Location: [facility];  Service: Gastroenterology;;     OB History     Gravida  0   Para  0   Term  0   Preterm  0   AB  0   Living  0      SAB  0   IAB  0   Ectopic  0   Multiple  0   Live Births  0           Family History  Problem Relation Age of Onset   Diabetes Mother    Diabetes Father    Heart disease Father    Diabetes Sister    Congestive Heart Failure Sister 7   Diabetes Brother     Social History   Tobacco  Use   Smoking status: Never   Smokeless tobacco: Never  Vaping Use   Vaping Use: Never used  Substance Use Topics   Alcohol use: No   Drug use: No    Home Medications Prior to Admission medications   Medication Sig Start Date End Date Taking? Authorizing Provider  albuterol (PROVENTIL) (2.5 MG/3ML) 0.083% nebulizer solution Take 3 mLs (2.5 mg total) by nebulization every 6 (six) hours as needed for wheezing or shortness of breath. 04/06/19   Scot Jun, FNP  atorvastatin (LIPITOR) 10 MG tablet TAKE ONE TABLET BY MOUTH ONCE DAILY (AM) Patient taking differently: Take 10 mg by mouth daily. 10/22/20   Alcus Dad, MD  blood glucose meter kit and supplies KIT Dispense based on patient and insurance preference. Use up to four  times daily as directed. (FOR ICD-9 250.00, 250.01). 12/13/18   [name], MD  cetirizine (ZYRTEC) 10 MG tablet Take 10 mg by mouth daily. 10/21/20   [provider]  DULoxetine HCl 40 MG CPEP TAKE ONE CAPSULE BY MOUTH TWICE DAILY Patient taking differently: Take 40 mg by mouth in the morning and at bedtime. 10/22/20   Alcus Dad, MD  EASY COMFORT PEN NEEDLES 31G X 5 MM MISC USE 3 TIMES A DAY FOR INSULIN ADMINISTRATION 11/14/19   Meccariello, Mel Almond J, DO  ELIQUIS 5 MG TABS tablet TAKE 1 TABLET BY MOUTH 2 (TWO) TIMES DAILY. Patient taking differently: Take 5 mg by mouth 2 (two) times daily. 12/20/19   Meccariello, Bernita Raisin, DO  fluticasone (FLONASE) 50 MCG/ACT nasal spray Place 2 sprays into both nostrils daily as needed for allergies or rhinitis. 12/19/18   Rai, Ripudeep K, MD  HYDROmorphone (DILAUDID) 4 MG tablet Take 1 tablet (4 mg total) by mouth 2 (two) times daily as needed for severe pain. 01/08/21   Bayard Hugger, NP  isosorbide mononitrate (IMDUR) 60 MG 24 hr tablet TAKE 1 TABLET (60 MG TOTAL) BY MOUTH DAILY. 09/10/20   Alcus Dad, MD  LANTUS SOLOSTAR 100 UNIT/ML Solostar Pen Inject 50 Units into the skin at bedtime. 11/24/20 12/26/20   Enzo Bi, MD  melatonin 3 MG TABS tablet TAKE 2 TABLETS (6 MG TOTAL) BY MOUTH AT BEDTIME. 08/11/20   Meccariello, Bernita Raisin, DO  metoprolol succinate (TOPROL-XL) 50 MG 24 hr tablet Take 1 tablet (50 mg total) by mouth daily. Take with or immediately following a meal. 11/24/20 02/22/21  Enzo Bi, MD  nitroGLYCERIN (NITROSTAT) 0.4 MG SL tablet Place 1 tablet (0.4 mg total) under the tongue every 5 (five) minutes as needed for chest pain. 12/13/18   [name], MD  NOVOLOG FLEXPEN 100 UNIT/ML FlexPen Inject 30 Units into the skin in the morning, at noon, in the evening, and at bedtime. 11/24/20   Enzo Bi, MD  ondansetron (ZOFRAN ODT) 4 MG disintegrating tablet Take 1 tablet (4 mg total) by mouth every 8 (eight) hours as needed for nausea or vomiting. 09/14/20   [place], Kathleene Hazel, PA  pantoprazole (PROTONIX) 40 MG tablet TAKE 1 TABLET (40 MG TOTAL) BY MOUTH 2 (TWO) TIMES DAILY. 10/22/20   Alcus Dad, MD  polyethylene glycol (MIRALAX / GLYCOLAX) 17 g packet Take 34 g by mouth daily. 11/24/20   Enzo Bi, MD  tiZANidine (ZANAFLEX) 4 MG tablet Take 4 mg by mouth 3 (three) times daily as needed for muscle spasms. 08/11/20   [provider]  torsemide (DEMADEX) 20 MG tablet Take 2 tablets (40 mg total) by mouth daily. 11/28/20 02/26/21  Enzo Bi, MD  vitamin B-12 1000 MCG tablet Take 1 tablet (1,000 mcg total) by mouth daily. Can take any over-the-counter Vit B12 supplement. 11/24/20   Enzo Bi, MD    Allergies    Diazepam, Gabapentin, Iodinated diagnostic agents, Isovue [iopamidol], Lisinopril, Metoclopramide, Nsaids, Penicillins, Tolmetin, Aspirin, Cyclobenzaprine, Rifamycins, Acetaminophen, Oxycodone, and Tramadol  Review of Systems   Review of Systems  Gastrointestinal:  Positive for abdominal pain.  All other systems reviewed and are negative.  Physical Exam Updated Vital Signs BP (!) 129/93 (BP Location: Right Arm)   Pulse (!) 106   Temp 98.2 F (36.8 C) (Oral)   Resp (!) 22   Ht  5' 6" (1.676 m)   Wt 136.1 kg   LMP 10/10/2012   SpO2 100%   BMI 48.42  kg/m   Physical Exam Vitals and nursing note reviewed.  55 year old female, appears uncomfortable, but is in no acute distress. Vital signs are significant for mildly elevated heart rate, respiratory rate, diastolic blood pressure. Oxygen saturation is 100%, which is normal. Head is normocephalic and atraumatic. PERRLA, EOMI. Oropharynx is clear. Neck is nontender and supple without adenopathy or JVD. Back is nontender and there is no CVA tenderness. Lungs are clear without rales, wheezes, or rhonchi. Chest is nontender. Heart has regular rate and rhythm without murmur. Abdomen is soft, flat, with moderate tenderness diffusely which is worse across the lower abdomen.  There is no rebound or guarding.  There are no masses or hepatosplenomegaly and peristalsis is hypoactive. Extremities have no cyanosis or edema, full range of motion is present. Skin is warm and dry without rash. Neurologic: Mental status is normal, cranial nerves are intact, there are no motor or sensory deficits.  ED Results / Procedures / Treatments   Labs (all labs ordered are listed, but only abnormal results are displayed) Labs Reviewed  COMPREHENSIVE METABOLIC PANEL - Abnormal; Notable for the following components:      Result Value   CO2 19 (*)    Glucose, Bld 220 (*)    Creatinine, Ser 2.08 (*)    Calcium 8.5 (*)    Albumin 3.2 (*)    AST 11 (*)    GFR, Estimated 27 (*)    All other components within normal limits  CBC - Abnormal; Notable for the following components:   RBC 2.76 (*)    Hemoglobin 7.8 (*)    HCT 26.0 (*)    Platelets 430 (*)    All other components within normal limits  LIPASE, BLOOD  URINALYSIS, ROUTINE W REFLEX MICROSCOPIC  TROPONIN I (HIGH SENSITIVITY)  TROPONIN I (HIGH SENSITIVITY)    EKG EKG Interpretation  Date/Time:  Saturday January 31 2021 00:00:49 EST Ventricular Rate:  105 PR  Interval:  155 QRS Duration: 85 QT Interval:  350 QTC Calculation: 463 R Axis:   -30 Text Interpretation: Sinus tachycardia Ventricular premature complex Abnormal R-wave progression, late transition Left ventricular hypertrophy When compared with ECG of 11/20/2020, No significant change was found Confirmed by Delora Fuel ([id number]) on 01/31/2021 12:36:28 AM  Radiology CT ABDOMEN PELVIS WO CONTRAST  Result Date: 01/31/2021 CLINICAL DATA:  55 year old female with history of abdominal pain. EXAM: CT ABDOMEN AND PELVIS WITHOUT CONTRAST TECHNIQUE: Multidetector CT imaging of the abdomen and pelvis was performed following the standard protocol without IV contrast. COMPARISON:  CT of the abdomen and pelvis 11/20/2020. FINDINGS: Lower chest: Unremarkable. Hepatobiliary: No suspicious cystic or solid hepatic lesions are confidently identified on today's noncontrast CT examination. Status post cholecystectomy. Pancreas: No definite pancreatic mass or peripancreatic fluid collections or inflammatory changes noted on today's noncontrast CT examination. Spleen: Unremarkable. Adrenals/Urinary Tract: There are no abnormal calcifications within the collecting system of either kidney, along the course of either ureter, or within the lumen of the urinary bladder. No hydroureteronephrosis or perinephric stranding to suggest urinary tract obstruction at this time. The unenhanced appearance of the kidneys is unremarkable bilaterally. Unenhanced appearance of the urinary bladder is normal. Bilateral adrenal glands are normal in appearance. Stomach/Bowel: Unenhanced appearance of the stomach is normal. No pathologic dilatation of small bowel or colon. Normal appendix. Vascular/Lymphatic: Atherosclerotic calcifications in the abdominal aorta. No lymphadenopathy noted in the abdomen or pelvis. Reproductive: Again noted associated with the left side of the uterine fundus and left adnexal region there  is a 5.0 x 4.1 x 4.5 cm lesion  (axial image 67 of series 2 and coronal image 84 of series 5) which is stable compared to numerous prior examinations, favored to represent a large exophytic subserosal fibroid. Right ovary is unremarkable in appearance. Other: No significant volume of ascites.  No pneumoperitoneum. Musculoskeletal: There are no aggressive appearing lytic or blastic lesions noted in the visualized portions of the skeleton. IMPRESSION: 1. No acute findings are noted in the abdomen or pelvis to account for the patient's symptoms. 2. Aortic atherosclerosis. 3. Large lesion intimately associated with the left side of the uterine fundus and left adnexal region, very similar to numerous prior examinations, presumably a benign lesion such as a large exophytic subserosal fibroid. Electronically Signed   By: Vinnie Langton M.D.   On: 01/31/2021 07:00   DG Chest 2 View  Result Date: 01/31/2021 CLINICAL DATA:  Chest pain. EXAM: CHEST - 2 VIEW COMPARISON:  Chest radiograph dated 09/14/2020. FINDINGS: Shallow inspiration. No focal consolidation, pleural effusion or pneumothorax. No acute osseous pathology. Degenerative changes of the spine. IMPRESSION: No active cardiopulmonary disease. Electronically Signed   By: Anner Crete M.D.   On: 01/31/2021 01:05    Procedures Procedures   Medications Ordered in ED Medications  hydrocortisone sodium succinate (SOLU-CORTEF) injection 200 mg (0 mg Intravenous Hold 01/31/21 0203)  diphenhydrAMINE (BENADRYL) capsule 50 mg (0 mg Oral Hold 01/31/21 0408)    Or  diphenhydrAMINE (BENADRYL) injection 50 mg ( Intravenous See Alternative 01/31/21 0408)  HYDROmorphone (DILAUDID) injection 1 mg (has no administration in time range)  morphine 4 MG/ML injection 4 mg (4 mg Intravenous Given 01/31/21 0237)  ondansetron (ZOFRAN) injection 4 mg (4 mg Intravenous Given 01/31/21 0236)    ED Course  I have reviewed the triage vital signs and the nursing notes.  Pertinent labs & imaging results  that were available during my care of the patient were reviewed by me and considered in my medical decision making (see chart for details).    MDM Rules/Calculators/A&P                         Abdominal pain with radiation to the chest and shoulder concerning for possible perforated viscus.  Of note, prior CT scans of abdomen and pelvis and colonoscopy did not make any mention of presence of diverticulosis making diverticulitis very unlikely.  Previous upper endoscopy has shown gastritis.  Hemoglobin today is 7.8 which is stable for her.  Mild increase in platelet count is felt to be reactive.  CT of abdomen and pelvis has been ordered as has CT angiogram of the chest although it should be noted that in the presence of therapeutic anticoagulation pulmonary embolism is very unlikely.  Labs showed stable anemia, stable renal insufficiency with creatinine of 2.08 and GFR 27.  Troponin is normal x2.  Because of low GFR, with contrast CT scans are discontinued and noncontrast scans ordered.  CT scan shows no acute process.  Unfortunately, she only got minimal relief with a dose of morphine.  At this point, goal is to get adequate pain control.  She is given a dose of hydromorphone.  Case is signed out to Dr. Pearline Cables.  Final Clinical Impression(s) / ED Diagnoses Final diagnoses:  Upper abdominal pain  Normochromic normocytic anemia  Renal insufficiency  Thrombocytosis    Rx / DC Orders ED Discharge Orders     None        Delora Fuel,  MD 01/31/21 [phone]

## 2021-02-01 LAB — URINE CULTURE

## 2021-02-20 ENCOUNTER — Other Ambulatory Visit: Payer: Self-pay

## 2021-02-20 ENCOUNTER — Encounter: Payer: Medicare Other | Attending: Registered Nurse | Admitting: Physical Medicine and Rehabilitation

## 2021-02-20 VITALS — BP 127/92 | HR 89

## 2021-02-20 DIAGNOSIS — Z5181 Encounter for therapeutic drug level monitoring: Secondary | ICD-10-CM | POA: Diagnosis present

## 2021-02-20 DIAGNOSIS — Z79891 Long term (current) use of opiate analgesic: Secondary | ICD-10-CM | POA: Diagnosis present

## 2021-02-20 DIAGNOSIS — G894 Chronic pain syndrome: Secondary | ICD-10-CM | POA: Diagnosis present

## 2021-02-20 MED ORDER — NAC 600 MG PO CAPS: 1.0000 | ORAL_CAPSULE | Freq: Two times a day (BID) | ORAL | 3 refills | Status: DC

## 2021-02-20 MED ORDER — MECLIZINE HCL 25 MG PO TABS: 25.0000 mg | ORAL_TABLET | Freq: Three times a day (TID) | ORAL | 0 refills | Status: DC | PRN

## 2021-02-20 MED ORDER — HYDROMORPHONE HCL 4 MG PO TABS
4.0000 mg | ORAL_TABLET | Freq: Two times a day (BID) | ORAL | 0 refills | Status: DC | PRN
Start: 2021-02-20 — End: 2021-04-21

## 2021-02-20 MED ORDER — BACLOFEN 10 MG PO TABS: 10.0000 mg | ORAL_TABLET | Freq: Three times a day (TID) | ORAL | 0 refills | Status: DC

## 2021-02-20 NOTE — Progress Notes (Signed)
Subjective:    Patient ID: Deborah [name], female    DOB: August 26, 1965, 55 y.o.   MRN: [id number]  HPI: Deborah [name] is a 55 y.o. female who returns for follow up appointment for chronic pain and medication refill. She reports generalized  pain all over. She rates her pain 10. Her current exercise regime is walking to the bathroom in her home otherwise she is in wheelchair.   UDS ordered today  She has been taking 4mg  of Dilaudid 2 times per day and this does not help her enough. She has been going to the ED to get IV Dilaudid treatments. She is in cancer remission and does not have any active treatments. She asks about IV Dilaudid treatment and I discussed that our clinic does not offer this option, but we could refer to another pain clinic if she would like- she does not want to change clinics but will let me know.   Nausea has been very severe. Compazine does not help. Zofran has helped but her QT interval is prolonged. She asks what this means  She was recently hospitalized due to her pain. She has been hospitalized multiple times for pain.  She was unable to finish her doctor's appointment with her eye doctor.  She is going to get a second opinion from the doctor who removed with her tumor.  She has a cough right now.   Blood pressure is much better today, BP 127/92 Yesterday she was feeling really dizzy.   Has not been able to do anything but lay in the bed since she is in so much pain.    Pain Inventory Average Pain 10 Pain Right Now 10 My pain is constant, sharp, and stabbing  In the last 24 hours, has pain interfered with the following? General activity 10 Relation with others 10 Enjoyment of life 10 What TIME of day is your pain at its worst? morning , daytime, evening, and night Sleep (in general) Poor  Pain is worse with: walking, bending, sitting, inactivity, standing, and some activites Pain improves with: medication Relief from Meds: 3  Family  History  Problem Relation Age of Onset   Diabetes Mother    Diabetes Father    Heart disease Father    Diabetes Sister    Congestive Heart Failure Sister 78   Diabetes Brother    Social History   Socioeconomic History   Marital status: Single    Spouse name: Not on file   Number of children: Not on file   Years of education: Not on file   Highest education level: Not on file  Occupational History   Not on file  Tobacco Use   Smoking status: Never   Smokeless tobacco: Never  Vaping Use   Vaping Use: Never used  Substance and Sexual Activity   Alcohol use: No   Drug use: No   Sexual activity: Not Currently    Birth control/protection: None  Other Topics Concern   Not on file  Social History Narrative   ** Merged History Encounter **       Social Determinants of Health   Financial Resource Strain: Not on file  Food Insecurity: Not on file  Transportation Needs: Not on file  Physical Activity: Not on file  Stress: Not on file  Social Connections: Not on file   Past Surgical History:  Procedure Laterality Date   BIOPSY  07/27/2019   Procedure: BIOPSY;  Surgeon: Clarene Essex, MD;  Location: [facility];  Service: Endoscopy;;   BIOPSY  07/30/2019   Procedure: BIOPSY;  Surgeon: Otis Brace, MD;  Location: [facility];  Service: Gastroenterology;;   CATARACT EXTRACTION  01/2014   CHOLECYSTECTOMY     COLONOSCOPY WITH PROPOFOL N/A 07/30/2019   Procedure: COLONOSCOPY WITH PROPOFOL;  Surgeon: Otis Brace, MD;  Location: [facility];  Service: Gastroenterology;  Laterality: N/A;   ESOPHAGOGASTRODUODENOSCOPY N/A 07/27/2019   Procedure: ESOPHAGOGASTRODUODENOSCOPY (EGD);  Surgeon: Clarene Essex, MD;  Location: [facility];  Service: Endoscopy;  Laterality: N/A;   ESOPHAGOGASTRODUODENOSCOPY N/A 07/26/2020   Procedure: ESOPHAGOGASTRODUODENOSCOPY (EGD);  Surgeon: Arta Silence, MD;  Location: [facility];  Service: Endoscopy;  Laterality: N/A;    ESOPHAGOGASTRODUODENOSCOPY (EGD) WITH PROPOFOL N/A 08/02/2019   Procedure: ESOPHAGOGASTRODUODENOSCOPY (EGD) WITH PROPOFOL;  Surgeon: Otis Brace, MD;  Location: [facility];  Service: Gastroenterology;  Laterality: N/A;   HEMOSTASIS CLIP PLACEMENT  08/02/2019   Procedure: HEMOSTASIS CLIP PLACEMENT;  Surgeon: Otis Brace, MD;  Location: [facility];  Service: Gastroenterology;;   POLYPECTOMY  07/30/2019   Procedure: POLYPECTOMY;  Surgeon: Otis Brace, MD;  [facility]: [facility];  Service: Gastroenterology;;   POLYPECTOMY  08/02/2019   Procedure: POLYPECTOMY;  Surgeon: Otis Brace, MD;  Location: [facility];  Service: Gastroenterology;;   Past Surgical History:  Procedure Laterality Date   BIOPSY  07/27/2019   Procedure: BIOPSY;  Surgeon: Clarene Essex, MD;  [facility]: [facility];  Service: Endoscopy;;   BIOPSY  07/30/2019   Procedure: BIOPSY;  Surgeon: Otis Brace, MD;  [facility]: [facility];  Service: Gastroenterology;;   CATARACT EXTRACTION  01/2014   CHOLECYSTECTOMY     COLONOSCOPY WITH PROPOFOL N/A 07/30/2019   Procedure: COLONOSCOPY WITH PROPOFOL;  Surgeon: Otis Brace, MD;  [facility]: [facility];  Service: Gastroenterology;  Laterality: N/A;   ESOPHAGOGASTRODUODENOSCOPY N/A 07/27/2019   Procedure: ESOPHAGOGASTRODUODENOSCOPY (EGD);  Surgeon: Clarene Essex, MD;  Location: [facility];  Service: Endoscopy;  Laterality: N/A;   ESOPHAGOGASTRODUODENOSCOPY N/A 07/26/2020   Procedure: ESOPHAGOGASTRODUODENOSCOPY (EGD);  Surgeon: Arta Silence, MD;  Location: [facility];  Service: Endoscopy;  Laterality: N/A;   ESOPHAGOGASTRODUODENOSCOPY (EGD) WITH PROPOFOL N/A 08/02/2019   Procedure: ESOPHAGOGASTRODUODENOSCOPY (EGD) WITH PROPOFOL;  Surgeon: Otis Brace, MD;  Location: [facility];  Service: Gastroenterology;  Laterality: N/A;   HEMOSTASIS CLIP PLACEMENT  08/02/2019   Procedure: HEMOSTASIS CLIP PLACEMENT;  Surgeon: Otis Brace, MD;  Location: [facility];  Service: Gastroenterology;;   POLYPECTOMY  07/30/2019   Procedure: POLYPECTOMY;  Surgeon: Otis Brace, MD;  Location: [facility];  Service: Gastroenterology;;   POLYPECTOMY  08/02/2019   Procedure: POLYPECTOMY;  Surgeon: Otis Brace, MD;  Location: [facility];  Service: Gastroenterology;;   Past Medical History:  Diagnosis Date   Acute back pain with sciatica, left    Acute back pain with sciatica, right    AKI (acute kidney injury) ([place])    Anemia, unspecified    Cancer ([place])    Carcinoid tumor of duodenum    Chest pain with normal coronary angiography 2019   Chronic kidney disease, stage 3b ([place])    Chronic pain    Chronic systolic CHF (congestive heart failure) ([place])    Diabetes mellitus    DKA (diabetic ketoacidosis) ([place])    Drug-seeking behavior    21 hospitalizations and 14 CT a/p in 2 years for N/V and abdominal pain, demanding only IV dilaudid   Elevated troponin    chronic   Esophageal reflux    Fibromyalgia    Gastric ulcer    Gastroparesis    Gout  Hyperlipidemia    Hypertension    Hypokalemia    Hypomagnesemia    Lumbosacral stenosis    LVH (left ventricular hypertrophy)    Morbid obesity ([facility])    NICM (nonischemic cardiomyopathy) ([facility])    PAF (paroxysmal atrial fibrillation) ([place])    Stroke ([place]) 02/2011   Thrombocytosis    Vitamin B12 deficiency anemia    BP (!) 127/92    Pulse 89    LMP 10/10/2012    SpO2 98%   Opioid Risk Score:   Fall Risk Score:  `1  Depression screen PHQ 2/9  Depression screen [facility] 2/9 08/01/2020 06/05/2020 01/04/2020 11/15/2019 11/14/2019 10/10/2019 09/20/2019  Decreased Interest 1 1 2 3 1 3 3   Down, Depressed, Hopeless 1 1 3 3 1 3 3   PHQ - 2 Score 2 2 5 6 2 6 6   Altered sleeping - - 3 3 - 3 3  Tired, decreased energy - - 3 3 - 3 3  Change in appetite - - 3 3 - 3 3  Feeling bad or failure about yourself  - - 3 3 - 2 2  Trouble concentrating - - 3 3 - 3 3  Moving slowly or fidgety/restless - - 3 3 - 3 1   Suicidal thoughts - - 0 3 - 0 1  PHQ-9 Score - - 23 27 - 23 -  Difficult doing work/chores - - - - - - Very difficult  Some recent data might be hidden    Review of Systems  Constitutional: Negative.   HENT: Negative.    Eyes: Negative.   Respiratory: Negative.    Gastrointestinal:  Positive for abdominal pain.  Endocrine: Negative.   Genitourinary: Negative.   Musculoskeletal:  Positive for back pain.       Bilateral hip pain Bilateral leg pain spasms  Allergic/Immunologic: Negative.   Neurological:  Positive for dizziness, tremors, weakness and numbness.       Tingling  Psychiatric/Behavioral: Negative.    All other systems reviewed and are negative.     Objective:   Physical Exam Vitals and nursing note reviewed.  Constitutional:      Appearance: She is ill-appearing. BMI 48.26. Husband accompanies her. BP 88/58 Cardiovascular:     Rate and Rhythm: Normal rate and regular rhythm.     Pulses: Normal pulses.     Heart sounds: Normal heart sounds.  Pulmonary:     Effort: Pulmonary effort is normal.     Breath sounds: Normal breath sounds. +cough, no signs of respiratory depression Musculoskeletal:     Cervical back: Normal range of motion and neck supple.     Comments: Normal Muscle Bulk and Muscle Testing Reveals:  Upper Extremities: Decreased ROM and Muscle Strength  4/5 Lower Extremities: Decreased ROM and Muscle Strength 4/4 Wheel-chair bound    Skin:    General: Skin is warm and dry.  Neurological:     Mental Status: She is alert and oriented to person, place, and time.  Psychiatric:        Mood and Affect: Mood normal.        Behavior: Behavior normal.  Abdominal tenderness.     Assessment & Plan:  1. 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 08/27/2020. Encouraged to follow-up with her surgeon.  2. Chronic Pain Syndrome: Refilled Dilaudid 4 mg one tablet BID PRN. Discussed with [name] that she can increase  frequency to TID in 1 month if she needs.  Getting a UDS or  swab today.  We will continue the opioid monitoring program, this consists of regular clinic visits, examinations, urine drug screen, pill counts as well as use of [place] Controlled Substance Reporting system. A 12 month History has been reviewed on the [place] Controlled Substance Reporting System.  -Discussed current symptoms of pain and history of pain.  -Baclofen to replace Tizanidine.  -Discussed benefits of exercise in reducing pain. -Discussed following foods that may reduce pain: 1) Ginger (especially studied for arthritis)- reduce leukotriene production to decrease inflammation 2) Blueberries- high in phytonutrients that decrease inflammation 3) Salmon- marine omega-3s reduce joint swelling and pain 4) Pumpkin seeds- reduce inflammation 5) dark chocolate- reduces inflammation 6) turmeric- reduces inflammation 7) tart cherries - reduce pain and stiffness 8) extra virgin olive oil - its compound olecanthal helps to block prostaglandins  9) chili peppers- can be eaten or applied topically via capsaicin 10) mint- helpful for headache, muscle aches, joint pain, and itching 11) garlic- reduces inflammation  Link to further information on diet for chronic pain: [email or web address]    3. Nausea:  -advised to get off Zofran and Phenergan given QT prolongation -recommended warm water with honey, lime, and ginger  4) Fatigue: -NAC BID 600mg  PRN.   5) HTN -advised if she is hypotensive, then to avoid the hydralazine.   6) Fatty liver -NAC BID 600mg  PRN prescribed   7) Prolonged QT interval -discussed what this means, recommend to avoid zofran and tizanidine -can replace tizandine with baclofen, discussed ginger as an alternative for her nausea.    Monthly with [name], advised to call me as she needs, in-person appointments with me as she  needs.

## 2021-02-24 ENCOUNTER — Other Ambulatory Visit: Payer: Self-pay | Admitting: Physical Medicine and Rehabilitation

## 2021-02-24 NOTE — Telephone Encounter (Signed)
Pharmacy is sending a refill requests for Pantoprazole, Isosorbide, Ceterizine, Duloxetine and Hydralazine. Would you like to refill?

## 2021-02-25 NOTE — Telephone Encounter (Signed)
Please call Ms. Herzig and the pharmacy.  Thanks

## 2021-02-25 NOTE — Telephone Encounter (Signed)
Spoke with [employer] and informed them that medications were prescribed by Dr. Alcus Dad.

## 2021-02-27 LAB — DRUG TOX MONITOR 1 W/CONF, ORAL FLD
Amphetamines: NEGATIVE ng/mL (ref <10)
Barbiturates: NEGATIVE ng/mL (ref <10)
Benzodiazepines: NEGATIVE ng/mL (ref <0.50)
Buprenorphine: NEGATIVE ng/mL (ref <0.10)
Cocaine: NEGATIVE ng/mL (ref <5.0)
Fentanyl: NEGATIVE ng/mL (ref <0.10)
Heroin Metabolite: NEGATIVE ng/mL (ref <1.0)
MARIJUANA: NEGATIVE ng/mL (ref <2.5)
MDMA: NEGATIVE ng/mL (ref <10)
Meprobamate: NEGATIVE ng/mL (ref <2.5)
Methadone: NEGATIVE ng/mL (ref <5.0)
Nicotine Metabolite: NEGATIVE ng/mL (ref <5.0)
Opiates: NEGATIVE ng/mL (ref <2.5)
Phencyclidine: NEGATIVE ng/mL (ref <10)
Tapentadol: NEGATIVE ng/mL (ref <5.0)
Tramadol: NEGATIVE ng/mL (ref <5.0)
Zolpidem: NEGATIVE ng/mL (ref <5.0)

## 2021-02-27 LAB — DRUG TOX ALC METAB W/CON, ORAL FLD: Alcohol Metabolite: NEGATIVE ng/mL (ref <25)

## 2021-03-11 ENCOUNTER — Telehealth: Payer: Self-pay

## 2021-03-11 ENCOUNTER — Telehealth: Payer: Self-pay | Admitting: *Deleted

## 2021-03-11 ENCOUNTER — Other Ambulatory Visit: Payer: Self-pay | Admitting: Physical Medicine and Rehabilitation

## 2021-03-11 ENCOUNTER — Other Ambulatory Visit: Payer: Self-pay | Admitting: Family Medicine

## 2021-03-11 MED ORDER — PANTOPRAZOLE SODIUM 40 MG PO TBEC: 40.0000 mg | DELAYED_RELEASE_TABLET | Freq: Two times a day (BID) | ORAL | 1 refills | Status: DC

## 2021-03-11 MED ORDER — DULOXETINE HCL 40 MG PO CPEP: 40.0000 mg | ORAL_CAPSULE | Freq: Two times a day (BID) | ORAL | 3 refills | Status: DC

## 2021-03-11 MED ORDER — ISOSORBIDE MONONITRATE ER 60 MG PO TB24: 60.0000 mg | ORAL_TABLET | Freq: Every day | ORAL | 1 refills | Status: DC

## 2021-03-11 MED ORDER — HYDRALAZINE HCL 50 MG PO TABS: 50.0000 mg | ORAL_TABLET | Freq: Three times a day (TID) | ORAL | 3 refills | Status: DC

## 2021-03-11 MED ORDER — CETIRIZINE HCL 10 MG PO TABS: 10.0000 mg | ORAL_TABLET | Freq: Every day | ORAL | 3 refills | Status: DC

## 2021-03-11 NOTE — Telephone Encounter (Signed)
PCP left practice and no seeing [facility] and have see them two times before start filling prescription, will not see them until after the 9th. She states she has 4 prescriptions to be filled. Pharmacy asked if Dr. Ranell Patrick can have one time refill. [facility]. Need in pill bubble pack before leaving for out of town in 03/16/21 been out of meds for a week.   Cetirizine 10  mg one daily Duloxetine 40 mg one BID Hydralazine 50 mg one TID Isosorbide Mononitrate 60 mg one daily Pantoprazole Sodium 40 mg one BID

## 2021-03-11 NOTE — Telephone Encounter (Addendum)
-----   Message from Deborah Ribas, MD sent at 03/02/2021  9:04 AM EST ----- Please send warning letter since UDS does not show prescribed opioids, thank you!  Letter sent informing her and letting her know that she will be required to be urine test only and if she cannot go for any reason with in the 24 hour time frame of request, she will be non narcotic tx or discharged.

## 2021-03-11 NOTE — Telephone Encounter (Signed)
Patient notified

## 2021-03-30 IMAGING — CR DG CHEST 2V
2 series · 2 of 2 positions shown · non-contrast
Comparison: January 26, 2019

CLINICAL DATA: Chest pain

EXAM:
CHEST - 2 VIEW

[w chest pa]
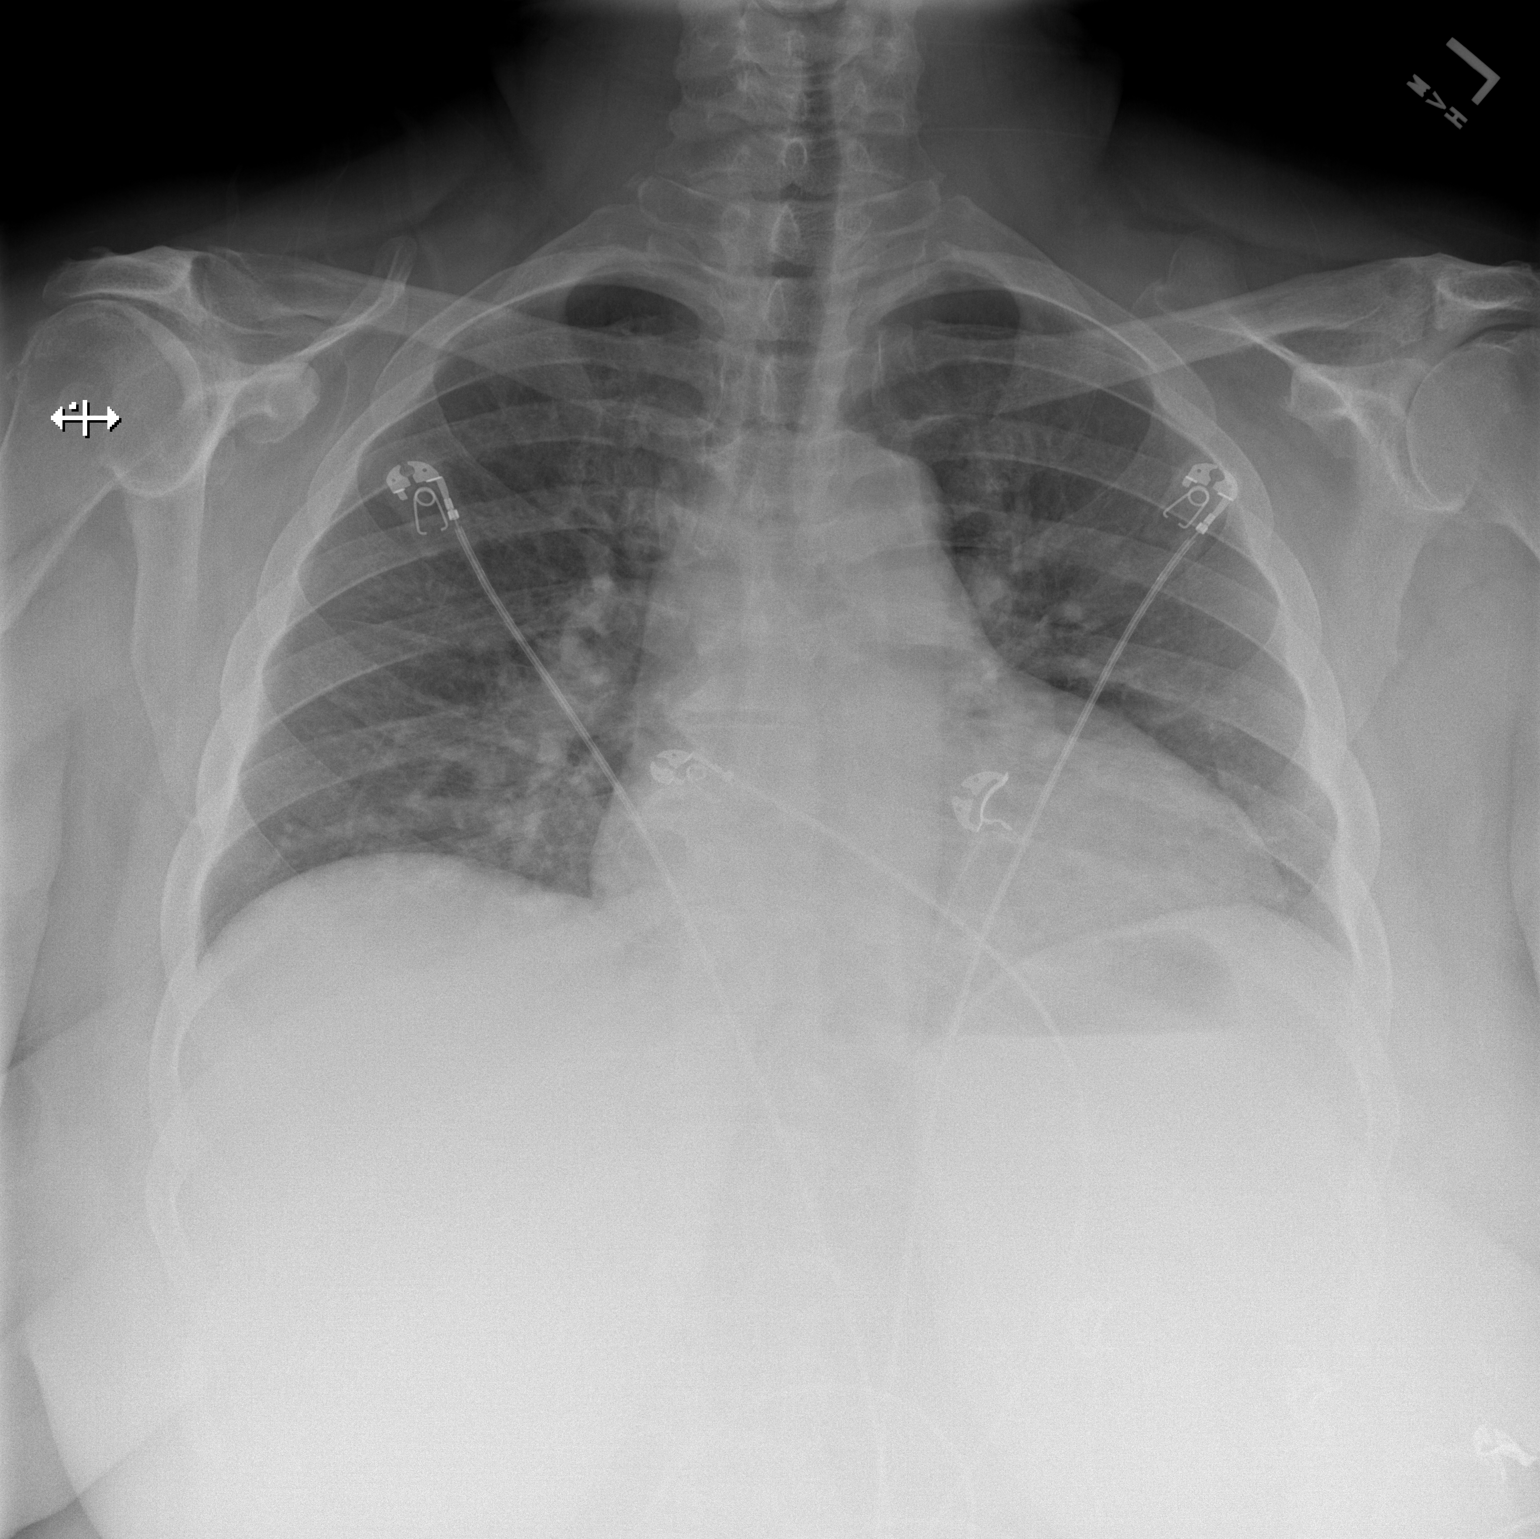

[w chest lat]
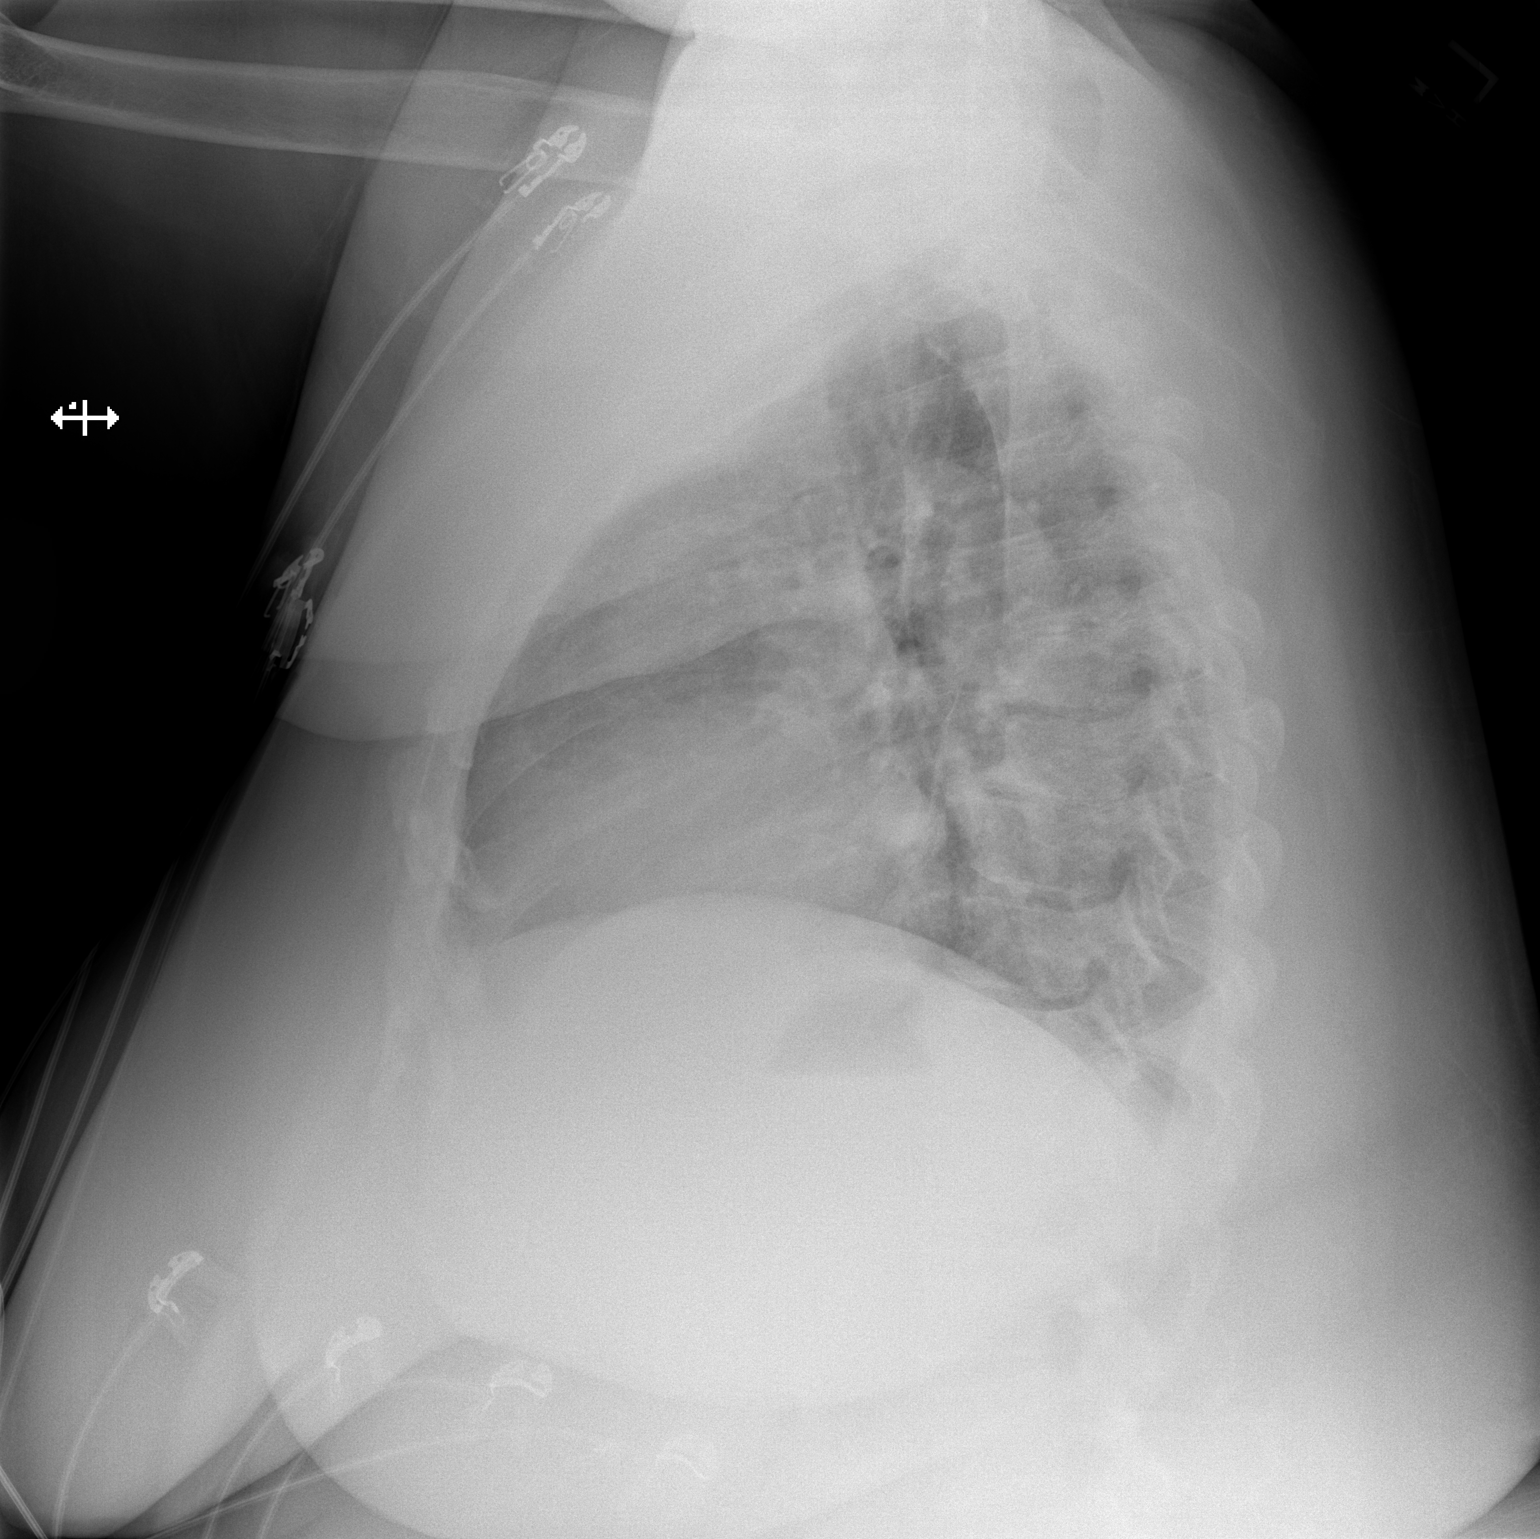

[2 of 2 positions shown; findings below may reference images not displayed]

FINDINGS: Lungs are clear. Heart is borderline enlarged with pulmonary
vascularity normal. No adenopathy. No pneumothorax. No bone lesions.
IMPRESSION: Borderline cardiac enlargement.  No edema or consolidation.

## 2021-03-31 IMAGING — DX DG CHEST 2V
2 series · 2 of 2 positions shown · non-contrast
Comparison: Yesterday

CLINICAL DATA: Chest pain

EXAM:
CHEST - 2 VIEW

[w chest lat]
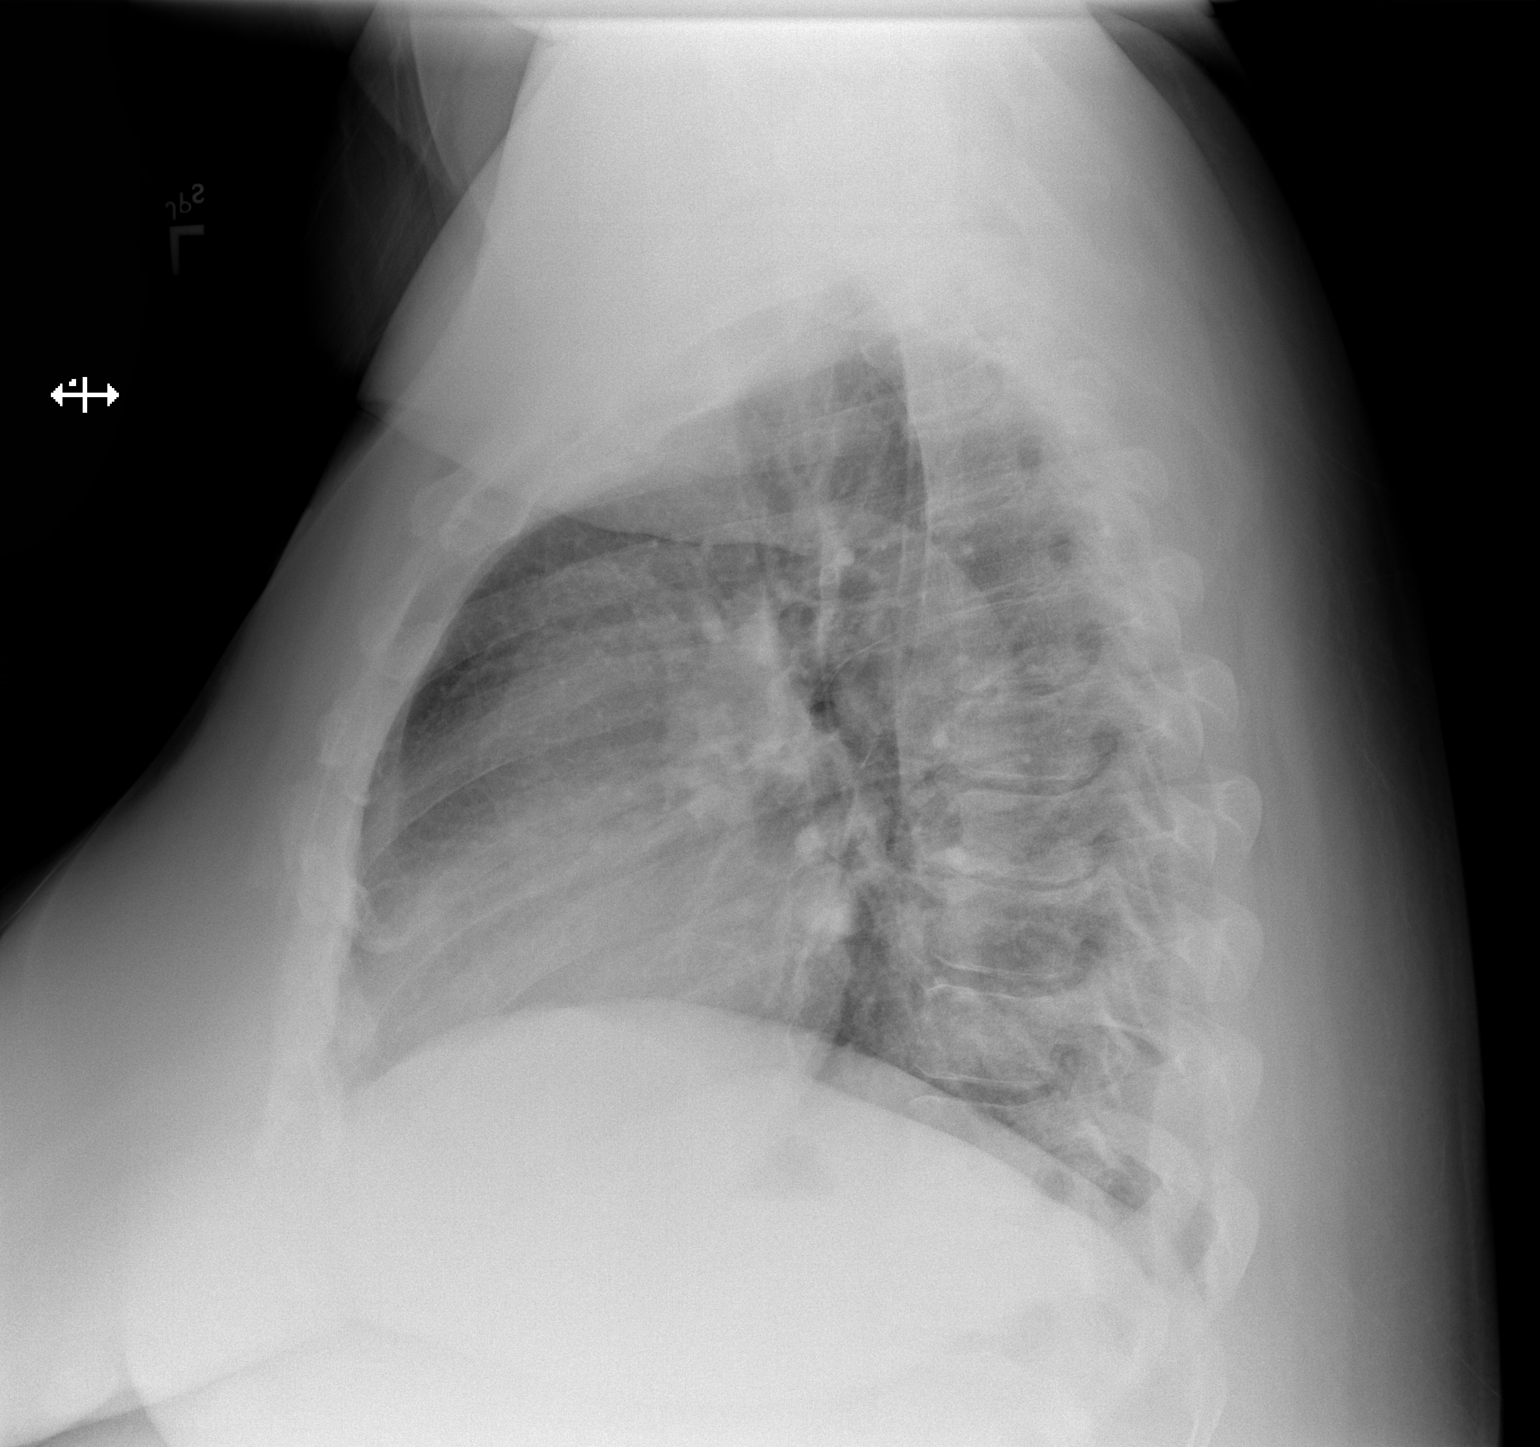

[x chest ap]
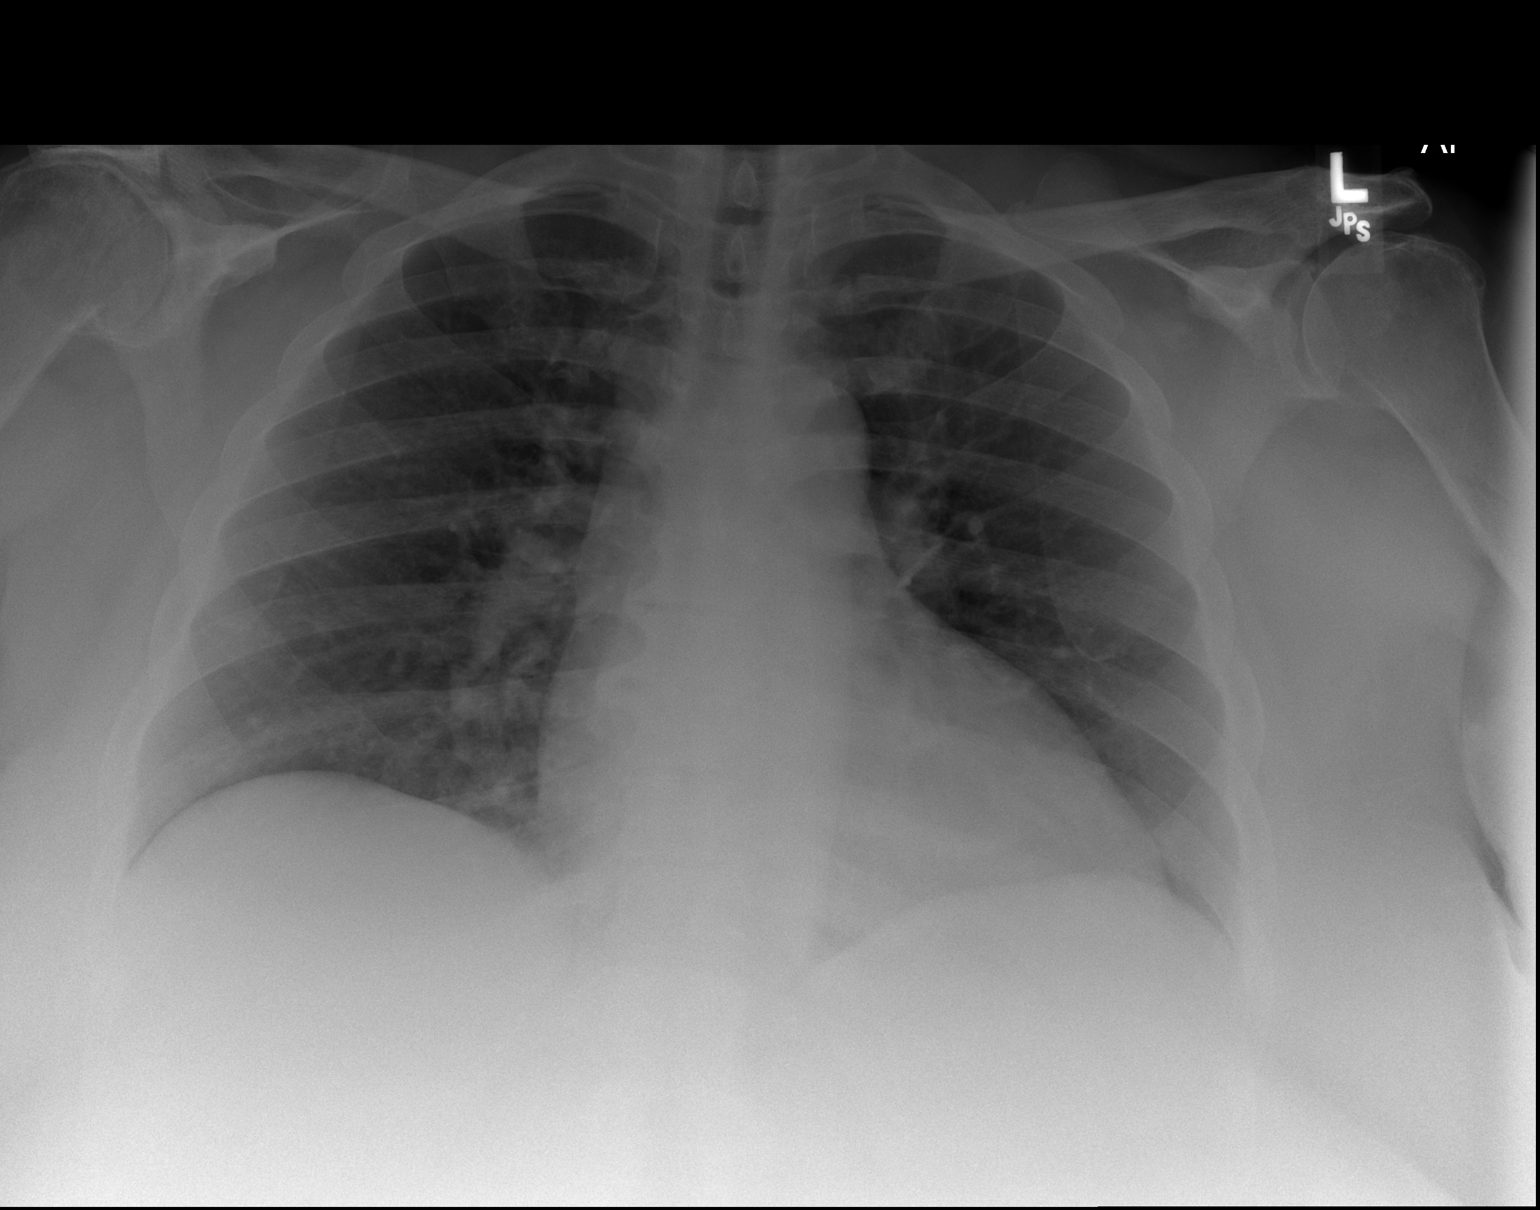

[2 of 2 positions shown; findings below may reference images not displayed]

FINDINGS: Mild cardiomegaly accentuated by low volumes. There is no edema,
consolidation, effusion, or pneumothorax. No acute osseous finding
IMPRESSION: Stable low volume chest.  No evidence of active disease.

Mild cardiomegaly.

## 2021-04-02 ENCOUNTER — Encounter: Payer: Medicare Other | Admitting: Registered Nurse

## 2021-04-07 ENCOUNTER — Other Ambulatory Visit: Payer: Self-pay | Admitting: Physical Medicine and Rehabilitation

## 2021-04-08 ENCOUNTER — Telehealth: Payer: Self-pay

## 2021-04-08 NOTE — Telephone Encounter (Signed)
PA for Duloxetine sent to [facility]

## 2021-04-08 NOTE — Telephone Encounter (Signed)
Approved today Request Reference Number: [id number]. DULOXETINE CAP 40MG  is approved through 03/07/2022. Your patient may now fill this prescription and it will be covered.

## 2021-04-13 ENCOUNTER — Inpatient Hospital Stay (HOSPITAL_COMMUNITY): Payer: Medicare Other

## 2021-04-13 ENCOUNTER — Emergency Department (HOSPITAL_COMMUNITY): Payer: Medicare Other

## 2021-04-13 ENCOUNTER — Inpatient Hospital Stay (HOSPITAL_COMMUNITY)
Admission: EM | Admit: 2021-04-13 | Discharge: 2021-04-21 | DRG: 638 | Disposition: A | Discharge status: Home / Self Care | Payer: Medicare Other | Attending: Internal Medicine | Admitting: Internal Medicine

## 2021-04-13 ENCOUNTER — Other Ambulatory Visit: Payer: Self-pay

## 2021-04-13 ENCOUNTER — Encounter (HOSPITAL_COMMUNITY): Payer: Self-pay

## 2021-04-13 DIAGNOSIS — I480 Paroxysmal atrial fibrillation: Secondary | ICD-10-CM | POA: Diagnosis present

## 2021-04-13 DIAGNOSIS — Z6841 Body Mass Index (BMI) 40.0 and over, adult: Secondary | ICD-10-CM | POA: Diagnosis not present

## 2021-04-13 DIAGNOSIS — Z885 Allergy status to narcotic agent status: Secondary | ICD-10-CM

## 2021-04-13 DIAGNOSIS — D638 Anemia in other chronic diseases classified elsewhere: Secondary | ICD-10-CM | POA: Diagnosis present

## 2021-04-13 DIAGNOSIS — I130 Hypertensive heart and chronic kidney disease with heart failure and stage 1 through stage 4 chronic kidney disease, or unspecified chronic kidney disease: Secondary | ICD-10-CM | POA: Diagnosis present

## 2021-04-13 DIAGNOSIS — E872 Acidosis, unspecified: Secondary | ICD-10-CM | POA: Diagnosis not present

## 2021-04-13 DIAGNOSIS — G8929 Other chronic pain: Secondary | ICD-10-CM | POA: Diagnosis present

## 2021-04-13 DIAGNOSIS — F919 Conduct disorder, unspecified: Secondary | ICD-10-CM | POA: Diagnosis present

## 2021-04-13 DIAGNOSIS — K219 Gastro-esophageal reflux disease without esophagitis: Secondary | ICD-10-CM | POA: Diagnosis present

## 2021-04-13 DIAGNOSIS — Z8249 Family history of ischemic heart disease and other diseases of the circulatory system: Secondary | ICD-10-CM

## 2021-04-13 DIAGNOSIS — Z794 Long term (current) use of insulin: Secondary | ICD-10-CM | POA: Diagnosis not present

## 2021-04-13 DIAGNOSIS — F1123 Opioid dependence with withdrawal: Secondary | ICD-10-CM | POA: Diagnosis not present

## 2021-04-13 DIAGNOSIS — D509 Iron deficiency anemia, unspecified: Secondary | ICD-10-CM | POA: Diagnosis present

## 2021-04-13 DIAGNOSIS — R1084 Generalized abdominal pain: Secondary | ICD-10-CM

## 2021-04-13 DIAGNOSIS — R52 Pain, unspecified: Secondary | ICD-10-CM

## 2021-04-13 DIAGNOSIS — Z20822 Contact with and (suspected) exposure to covid-19: Secondary | ICD-10-CM | POA: Diagnosis present

## 2021-04-13 DIAGNOSIS — I5022 Chronic systolic (congestive) heart failure: Secondary | ICD-10-CM | POA: Diagnosis present

## 2021-04-13 DIAGNOSIS — I69351 Hemiplegia and hemiparesis following cerebral infarction affecting right dominant side: Secondary | ICD-10-CM | POA: Diagnosis not present

## 2021-04-13 DIAGNOSIS — K3184 Gastroparesis: Secondary | ICD-10-CM | POA: Diagnosis present

## 2021-04-13 DIAGNOSIS — E785 Hyperlipidemia, unspecified: Secondary | ICD-10-CM | POA: Diagnosis present

## 2021-04-13 DIAGNOSIS — Z79899 Other long term (current) drug therapy: Secondary | ICD-10-CM

## 2021-04-13 DIAGNOSIS — E1122 Type 2 diabetes mellitus with diabetic chronic kidney disease: Secondary | ICD-10-CM | POA: Diagnosis present

## 2021-04-13 DIAGNOSIS — Z7901 Long term (current) use of anticoagulants: Secondary | ICD-10-CM

## 2021-04-13 DIAGNOSIS — C170 Malignant neoplasm of duodenum: Secondary | ICD-10-CM | POA: Diagnosis present

## 2021-04-13 DIAGNOSIS — Z888 Allergy status to other drugs, medicaments and biological substances status: Secondary | ICD-10-CM

## 2021-04-13 DIAGNOSIS — Z886 Allergy status to analgesic agent status: Secondary | ICD-10-CM

## 2021-04-13 DIAGNOSIS — E1165 Type 2 diabetes mellitus with hyperglycemia: Secondary | ICD-10-CM | POA: Diagnosis present

## 2021-04-13 DIAGNOSIS — M797 Fibromyalgia: Secondary | ICD-10-CM | POA: Diagnosis present

## 2021-04-13 DIAGNOSIS — N179 Acute kidney failure, unspecified: Secondary | ICD-10-CM | POA: Diagnosis present

## 2021-04-13 DIAGNOSIS — E876 Hypokalemia: Secondary | ICD-10-CM | POA: Diagnosis present

## 2021-04-13 DIAGNOSIS — N1832 Chronic kidney disease, stage 3b: Secondary | ICD-10-CM | POA: Diagnosis present

## 2021-04-13 DIAGNOSIS — Z91041 Radiographic dye allergy status: Secondary | ICD-10-CM

## 2021-04-13 DIAGNOSIS — Z765 Malingerer [conscious simulation]: Secondary | ICD-10-CM

## 2021-04-13 DIAGNOSIS — K5900 Constipation, unspecified: Secondary | ICD-10-CM | POA: Diagnosis present

## 2021-04-13 DIAGNOSIS — Z8719 Personal history of other diseases of the digestive system: Secondary | ICD-10-CM

## 2021-04-13 DIAGNOSIS — R112 Nausea with vomiting, unspecified: Secondary | ICD-10-CM | POA: Diagnosis present

## 2021-04-13 DIAGNOSIS — R110 Nausea: Secondary | ICD-10-CM | POA: Diagnosis present

## 2021-04-13 DIAGNOSIS — E1143 Type 2 diabetes mellitus with diabetic autonomic (poly)neuropathy: Secondary | ICD-10-CM | POA: Diagnosis present

## 2021-04-13 DIAGNOSIS — N184 Chronic kidney disease, stage 4 (severe): Secondary | ICD-10-CM | POA: Diagnosis present

## 2021-04-13 DIAGNOSIS — E119 Type 2 diabetes mellitus without complications: Secondary | ICD-10-CM

## 2021-04-13 DIAGNOSIS — K5903 Drug induced constipation: Secondary | ICD-10-CM | POA: Diagnosis not present

## 2021-04-13 DIAGNOSIS — R1110 Vomiting, unspecified: Secondary | ICD-10-CM

## 2021-04-13 DIAGNOSIS — Z880 Allergy status to penicillin: Secondary | ICD-10-CM

## 2021-04-13 LAB — COMPREHENSIVE METABOLIC PANEL
ALT: 25 U/L (ref 0–44)
AST: 29 U/L (ref 15–41)
Albumin: 3.2 g/dL — ABNORMAL LOW (ref 3.5–5.0)
Alkaline Phosphatase: 88 U/L (ref 38–126)
Anion gap: 11 (ref 5–15)
BUN: 38 mg/dL — ABNORMAL HIGH (ref 6–20)
CO2: 17 mmol/L — ABNORMAL LOW (ref 22–32)
Calcium: 8.6 mg/dL — ABNORMAL LOW (ref 8.9–10.3)
Chloride: 110 mmol/L (ref 98–111)
Creatinine, Ser: 2.54 mg/dL — ABNORMAL HIGH (ref 0.44–1.00)
GFR, Estimated: 21 mL/min — ABNORMAL LOW (ref >60)
Glucose, Bld: 245 mg/dL — ABNORMAL HIGH (ref 70–99)
Potassium: 3.4 mmol/L — ABNORMAL LOW (ref 3.5–5.1)
Sodium: 138 mmol/L (ref 135–145)
Total Bilirubin: 0.2 mg/dL — ABNORMAL LOW (ref 0.3–1.2)
Total Protein: 8.4 g/dL — ABNORMAL HIGH (ref 6.5–8.1)

## 2021-04-13 LAB — URINALYSIS, ROUTINE W REFLEX MICROSCOPIC
Bilirubin Urine: NEGATIVE
Glucose, UA: NEGATIVE mg/dL
Hgb urine dipstick: NEGATIVE
Ketones, ur: NEGATIVE mg/dL
Leukocytes,Ua: NEGATIVE
Nitrite: NEGATIVE
Protein, ur: 100 mg/dL — AB
Specific Gravity, Urine: 1.014 (ref 1.005–1.030)
pH: 5 (ref 5.0–8.0)

## 2021-04-13 LAB — GLUCOSE, CAPILLARY
Glucose-Capillary: 134 mg/dL — ABNORMAL HIGH (ref 70–99)
Glucose-Capillary: 158 mg/dL — ABNORMAL HIGH (ref 70–99)

## 2021-04-13 LAB — CBC WITH DIFFERENTIAL/PLATELET
Abs Immature Granulocytes: 0.06 10*3/uL (ref 0.00–0.07)
Basophils Absolute: 0.1 10*3/uL (ref 0.0–0.1)
Basophils Relative: 1 %
Eosinophils Absolute: 0.4 10*3/uL (ref 0.0–0.5)
Eosinophils Relative: 3 %
HCT: 26.3 % — ABNORMAL LOW (ref 36.0–46.0)
Hemoglobin: 8 g/dL — ABNORMAL LOW (ref 12.0–15.0)
Immature Granulocytes: 0 %
Lymphocytes Relative: 21 %
Lymphs Abs: 3.1 10*3/uL (ref 0.7–4.0)
MCH: 28.7 pg (ref 26.0–34.0)
MCHC: 30.4 g/dL (ref 30.0–36.0)
MCV: 94.3 fL (ref 80.0–100.0)
Monocytes Absolute: 0.7 10*3/uL (ref 0.1–1.0)
Monocytes Relative: 5 %
Neutro Abs: 10.2 10*3/uL — ABNORMAL HIGH (ref 1.7–7.7)
Neutrophils Relative %: 70 %
Platelets: 395 10*3/uL (ref 150–400)
RBC: 2.79 MIL/uL — ABNORMAL LOW (ref 3.87–5.11)
RDW: 15.9 % — ABNORMAL HIGH (ref 11.5–15.5)
WBC: 14.6 10*3/uL — ABNORMAL HIGH (ref 4.0–10.5)
nRBC: 0 % (ref 0.0–0.2)

## 2021-04-13 LAB — RAPID URINE DRUG SCREEN, HOSP PERFORMED
Amphetamines: NOT DETECTED
Barbiturates: NOT DETECTED
Benzodiazepines: NOT DETECTED
Cocaine: NOT DETECTED
Opiates: POSITIVE — AB
Tetrahydrocannabinol: NOT DETECTED

## 2021-04-13 LAB — LIPASE, BLOOD: Lipase: 36 U/L (ref 11–51)

## 2021-04-13 LAB — RESP PANEL BY RT-PCR (FLU A&B, COVID) ARPGX2
Influenza A by PCR: NEGATIVE
Influenza B by PCR: NEGATIVE
SARS Coronavirus 2 by RT PCR: NEGATIVE

## 2021-04-13 LAB — HEMOGLOBIN A1C
Hgb A1c MFr Bld: 7.5 % — ABNORMAL HIGH (ref 4.8–5.6)
Mean Plasma Glucose: 168.55 mg/dL

## 2021-04-13 LAB — MAGNESIUM: Magnesium: 1.3 mg/dL — ABNORMAL LOW (ref 1.7–2.4)

## 2021-04-13 MED ORDER — ATORVASTATIN CALCIUM 10 MG PO TABS
10.0000 mg | ORAL_TABLET | Freq: Every day | ORAL | Status: DC
  Administered 2021-04-14 – 2021-04-20 (×5): 10 mg via ORAL
  Filled 2021-04-13 (×6): qty 1

## 2021-04-13 MED ORDER — INSULIN ASPART 100 UNIT/ML IJ SOLN
0.0000 [IU] | Freq: Three times a day (TID) | INTRAMUSCULAR | Status: DC
  Administered 2021-04-13: 3 [IU] via SUBCUTANEOUS
  Administered 2021-04-14: 4 [IU] via SUBCUTANEOUS
  Administered 2021-04-14: 11 [IU] via SUBCUTANEOUS
  Administered 2021-04-15: 4 [IU] via SUBCUTANEOUS
  Administered 2021-04-15: 7 [IU] via SUBCUTANEOUS
  Administered 2021-04-15 – 2021-04-17 (×4): 4 [IU] via SUBCUTANEOUS
  Administered 2021-04-17 – 2021-04-18 (×2): 7 [IU] via SUBCUTANEOUS
  Administered 2021-04-18 (×2): 4 [IU] via SUBCUTANEOUS
  Administered 2021-04-19: 7 [IU] via SUBCUTANEOUS
  Administered 2021-04-19: 4 [IU] via SUBCUTANEOUS
  Administered 2021-04-19: 3 [IU] via SUBCUTANEOUS
  Administered 2021-04-20 (×3): 4 [IU] via SUBCUTANEOUS
  Administered 2021-04-21: 11 [IU] via SUBCUTANEOUS

## 2021-04-13 MED ORDER — BACLOFEN 10 MG PO TABS
10.0000 mg | ORAL_TABLET | Freq: Three times a day (TID) | ORAL | Status: DC
  Administered 2021-04-13 – 2021-04-20 (×16): 10 mg via ORAL
  Filled 2021-04-13 (×19): qty 1

## 2021-04-13 MED ORDER — MAGNESIUM SULFATE 2 GM/50ML IV SOLN
2.0000 g | Freq: Once | INTRAVENOUS | Status: AC
  Administered 2021-04-13: 2 g via INTRAVENOUS
  Filled 2021-04-13: qty 50

## 2021-04-13 MED ORDER — METOPROLOL SUCCINATE ER 50 MG PO TB24
50.0000 mg | ORAL_TABLET | Freq: Every day | ORAL | Status: DC
  Administered 2021-04-13 – 2021-04-21 (×7): 50 mg via ORAL
  Filled 2021-04-13 (×8): qty 1

## 2021-04-13 MED ORDER — HYDROMORPHONE HCL 1 MG/ML IJ SOLN
1.0000 mg | INTRAMUSCULAR | Status: AC | PRN
  Administered 2021-04-13 (×3): 1 mg via INTRAVENOUS
  Filled 2021-04-13 (×3): qty 1

## 2021-04-13 MED ORDER — ONDANSETRON HCL 4 MG/2ML IJ SOLN
4.0000 mg | Freq: Once | INTRAMUSCULAR | Status: AC
  Administered 2021-04-13: 4 mg via INTRAVENOUS
  Filled 2021-04-13: qty 2

## 2021-04-13 MED ORDER — INSULIN GLARGINE-YFGN 100 UNIT/ML ~~LOC~~ SOLN
35.0000 [IU] | Freq: Every day | SUBCUTANEOUS | Status: DC
  Administered 2021-04-13: 35 [IU] via SUBCUTANEOUS
  Filled 2021-04-13 (×2): qty 0.35

## 2021-04-13 MED ORDER — SODIUM CHLORIDE 0.9 % IV SOLN: Freq: Once | INTRAVENOUS | Status: AC

## 2021-04-13 MED ORDER — INSULIN ASPART 100 UNIT/ML IJ SOLN: 0.0000 [IU] | Freq: Every day | INTRAMUSCULAR | Status: DC

## 2021-04-13 MED ORDER — CHLORHEXIDINE GLUCONATE 0.12 % MT SOLN
15.0000 mL | Freq: Two times a day (BID) | OROMUCOSAL | Status: DC
  Administered 2021-04-19 – 2021-04-20 (×2): 15 mL via OROMUCOSAL
  Filled 2021-04-13 (×8): qty 15

## 2021-04-13 MED ORDER — HYDROMORPHONE HCL 1 MG/ML IJ SOLN
1.0000 mg | Freq: Once | INTRAMUSCULAR | Status: AC
  Administered 2021-04-13: 1 mg via INTRAVENOUS
  Filled 2021-04-13: qty 1

## 2021-04-13 MED ORDER — ORAL CARE MOUTH RINSE
15.0000 mL | Freq: Two times a day (BID) | OROMUCOSAL | Status: DC
  Administered 2021-04-14: 15 mL via OROMUCOSAL

## 2021-04-13 MED ORDER — FOLIC ACID 1 MG PO TABS
1.0000 mg | ORAL_TABLET | Freq: Every day | ORAL | Status: DC
  Administered 2021-04-13 – 2021-04-20 (×6): 1 mg via ORAL
  Filled 2021-04-13 (×7): qty 1

## 2021-04-13 MED ORDER — SODIUM CHLORIDE 0.9 % IV BOLUS
500.0000 mL | Freq: Once | INTRAVENOUS | Status: AC
  Administered 2021-04-13: 500 mL via INTRAVENOUS

## 2021-04-13 MED ORDER — APIXABAN 5 MG PO TABS
5.0000 mg | ORAL_TABLET | Freq: Two times a day (BID) | ORAL | Status: DC
  Administered 2021-04-13 – 2021-04-14 (×3): 5 mg via ORAL
  Filled 2021-04-13 (×5): qty 1

## 2021-04-13 MED ORDER — PROCHLORPERAZINE EDISYLATE 10 MG/2ML IJ SOLN
10.0000 mg | Freq: Four times a day (QID) | INTRAMUSCULAR | Status: DC | PRN
  Filled 2021-04-13: qty 2

## 2021-04-13 MED ORDER — INSULIN GLARGINE 100 UNIT/ML SOLOSTAR PEN: 35.0000 [IU] | PEN_INJECTOR | Freq: Every day | SUBCUTANEOUS | Status: DC

## 2021-04-13 MED ORDER — HYDRALAZINE HCL 50 MG PO TABS
50.0000 mg | ORAL_TABLET | Freq: Three times a day (TID) | ORAL | Status: DC
  Administered 2021-04-13 – 2021-04-21 (×17): 50 mg via ORAL
  Filled 2021-04-13 (×20): qty 1

## 2021-04-13 MED ORDER — ISOSORBIDE MONONITRATE ER 60 MG PO TB24
60.0000 mg | ORAL_TABLET | Freq: Every day | ORAL | Status: DC
  Administered 2021-04-14 – 2021-04-20 (×5): 60 mg via ORAL
  Filled 2021-04-13 (×6): qty 1

## 2021-04-13 MED ORDER — POTASSIUM CHLORIDE 10 MEQ/100ML IV SOLN
10.0000 meq | INTRAVENOUS | Status: AC
  Administered 2021-04-13 (×3): 10 meq via INTRAVENOUS
  Filled 2021-04-13 (×3): qty 100

## 2021-04-13 NOTE — ED Triage Notes (Signed)
Pt arrived via [facility] from home.   C/o generalized abdominal pain and sharp stabbing pain that started when she woke up this morning.  C/o some nausea and shob     Hx of endoscopy on 20th of January. Pt states she had a stroke about 2 weeks ago on the right side with minimum residual.   HR-125 BP-145 palpated.  100% RA  A/Ox4 Ambulatory with 1 assistance. Pt uses wheelchair at home.

## 2021-04-13 NOTE — ED Notes (Signed)
Patient transported to CT 

## 2021-04-13 NOTE — ED Provider Notes (Signed)
Deborah [name] Provider Note   [name]: [id number] Arrival date & time: 04/13/21  0849     History  Chief Complaint  Patient presents with   Abdominal Pain    Deborah [name] is a 56 y.o. female.  56 year old female with medical history as detailed below presents for evaluation.  Patient reports significant nausea and vomiting over the last 48 hours.  Patient reports worsening symptoms in the last 24.  Patient unable to take any p.o. for the last 12 hours.  Patient complains of diffuse abdominal discomfort.  She denies fever.  She denies bloody emesis.  She denies chest pain or shortness of breath.  She denies urinary symptoms.  She denies diarrhea.  She reports recent admission in [place], [place].  See excerpt below    [place] Luis M. Cintron PA:  [facility] Problems  Diagnosis Date Noted POA   Acute kidney injury superimposed on chronic kidney disease ([place]) 03/21/2021 Unknown   Chronic anemia 03/21/2021 Unknown   Gastroparesis 03/19/2021 Yes   Atrial fibrillation ([place]) 12/10/2019 Yes   Type 2 diabetes mellitus, with long-term current use of insulin ([place]) 95/11/3265 Not Applicable   History of CVA (cerebrovascular accident) 12/45/8099 Not Applicable   CHF (congestive heart failure) ([place]) 11/09/2019 Yes   Resolved Hospital Problems  Diagnosis Date Noted Date Resolved POA   Increased anion gap metabolic acidosis 83/38/2505 03/22/2021 Yes   Follow Up Instructions Follow up with Dr Valora Piccolo in 2-4 weeks for a clinic video visit Follow up with PCP, Cardiology, and Nephrology in [place] information for follow-up  Alysia Penna, MD  Specialty: General Surgery, Thoracic Surgery  [address] [place] [address]  Address: [phone]   Next Steps: Schedule an appointment as soon as possible for a visit in 1 month(s)    Discharge Disposition: Home  Code Status at Discharge Orders Placed This Encounter CPR Status: Yes (Attempt Cardiopulmonary Resuscitation) Order Specific Question: Level of Intervention: Answer: Full Medical Treatment (Intubate if Indicated) Order Specific Question: Discussed With: Answer: Patient  [name]  Presenting Problems/Hospital Course Admission Diagnosis: [name] [R06.02] DVT (deep venous thrombosis) (HCC) [I82.409] AKI (acute kidney injury) ([place]) [N17.9] Increased anion gap metabolic acidosis [O97.35] Abdominal pain Present on Admission:  (Resolved) Increased anion gap metabolic acidosis  Atrial fibrillation (HCC)  CHF (congestive heart failure) ([place])  Gastroparesis  Hospital Course 56 yo M, PMH T2DM (c/b gastroparesis, s/p EGD with dilation and botox in the past), HFrEF (appears to have recovery, last EF Sept 2022 50-55), CKD III (baseline Cr 1.5), previous stroke with residual right sided weakness (2012), paroxysmal a fib (on eliquis), 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  In the ED, BP 165/112, HR 120, afebrile, on room air. Labs revealed WBC 16.9, Hb 7.6 (baseline 8-9), hs T 49, beta hydroxy 2.18, Cr 3 (baseline 1.5), glucose 247, AG 23. LA 1.2, lipase 21. CT head negative. CT a/p unremarkable. She was given dilaudid 0.5 x2 and zofran in the ED. 1L NS bolus and LR 150/hr was ordered. Admitted to medicine. Given pt's c/o of R arm pain and swelling, Dopplers ordered which was negative for UE VT. Esophageal surgery saw her and planned to do G-POEM surgery. However held due to concern for new stroke. MRIs ordered. Unable to do MRI as pt could not due to body  habitus. Neurology recommended repeat CTH, Carotid US and C-spine CT and to start pt on low intensity heparin. Repeat CTH was negative for acute intracranial hemorrhage. carotid US showed 50-69%  stenosis distal left ICA, Less than 50% stenosis right ICA. C-spine CT: Mild degenerative changes resulting in mild spinal canal narrowing at C5-C6 and C3-C4. Moderate right-sided neural foraminal narrowing at C5-C6. Cardiology was consulted for preop clearance. Pt's IVF was dc'ed due to volume overload and pt was given diuretics Lasix 10m. Given that pt's UOP was not adequate and concern for vol overload, she was started on Lasix 80 bid + metolazone bid. Pt had pyloromyotomy procedure on 03/27/21. There were no immediate postoperative complications. Patient underwent upper GI series on 03/28/2021 which demonstrated no leak. She was then transition to clears which she tolerated well. She was slowly advanced to soft diet which she also tolerated well. Patient experienced bilateral lower extremity edema. Nephrology and Cardiology was following and monitoring swelling, diuresis, and Cr. Patient was weaned off lasix drip and transitioned to Torsemide prior to discharge. Elquis was prescribed and recommended per cardiology. Patient was discharged with Eliquis and [facility] She was discharged to home after tolerating diet with good pain control, minimal nausea, and no significant abdominal pain altered from her baseline.    The history is provided by medical records.  Abdominal Pain Pain location:  Generalized Pain quality: aching and cramping   Pain radiates to:  Does not radiate Pain severity:  Moderate Onset quality:  Gradual Duration:  2 days     Home Medications Prior to Admission medications   Medication Sig Start Date End Date Taking? Authorizing Provider  Acetylcysteine (NAC) 600 MG CAPS Take 1 capsule (600 mg total) by mouth 2 (two) times daily. 02/20/21   Raulkar, KClide Deutscher MD  albuterol (PROVENTIL) (2.5 MG/3ML) 0.083% nebulizer solution Take 3 mLs (2.5 mg total) by nebulization every 6 (six) hours as needed for wheezing or shortness of breath. Patient  not taking: Reported on 01/31/2021 04/06/19   HScot Jun FNP  atorvastatin (LIPITOR) 10 MG tablet TAKE ONE TABLET BY MOUTH ONCE DAILY (AM) Patient taking differently: Take 10 mg by mouth daily. 10/22/20   WAlcus Dad MD  baclofen (LIORESAL) 10 MG tablet Take 1 tablet (10 mg total) by mouth 3 (three) times daily. 02/20/21   Raulkar, KClide Deutscher MD  blood glucose meter kit and supplies KIT Dispense based on patient and insurance preference. Use up to four times daily as directed. (FOR ICD-9 250.00, 250.01). 12/13/18   LNuala Alpha MD  cetirizine (ZYRTEC) 10 MG tablet Take 1 tablet (10 mg total) by mouth daily. 03/11/21   Raulkar, KClide Deutscher MD  DULoxetine HCl 40 MG CPEP Take 40 mg by mouth in the morning and at bedtime. 03/11/21   Raulkar, KClide Deutscher MD  EASY COMFORT PEN NEEDLES 31G X 5 MM MISC USE 3 TIMES A DAY FOR INSULIN ADMINISTRATION 11/14/19   Meccariello, BMel AlmondJ, DO  ELIQUIS 5 MG TABS tablet TAKE 1 TABLET BY MOUTH 2 (TWO) TIMES DAILY. Patient not taking: Reported on 01/31/2021 12/20/19   Meccariello, BBernita Raisin DO  fluticasone (FLONASE) 50 MCG/ACT nasal spray Place 2 sprays into both nostrils daily as needed for allergies or rhinitis. 12/19/18   Rai, RVernelle Emerald MD  hydrALAZINE (APRESOLINE) 50 MG tablet Take 1 tablet (50 mg total) by mouth 3 (three) times daily. 03/11/21   Raulkar, KClide Deutscher MD  HYDROmorphone (DILAUDID) 4 MG tablet  Take 1 tablet (4 mg total) by mouth 2 (two) times daily as needed for severe pain. 02/20/21   Raulkar, Clide Deutscher, MD  isosorbide mononitrate (IMDUR) 60 MG 24 hr tablet TAKE 1 TABLET (60 MG TOTAL) BY MOUTH DAILY (AM) 04/08/21   Raulkar, Clide Deutscher, MD  LANTUS SOLOSTAR 100 UNIT/ML Solostar Pen Inject 50 Units into the skin at bedtime. 11/24/20 01/31/22  Enzo Bi, MD  meclizine (ANTIVERT) 25 MG tablet Take 1 tablet (25 mg total) by mouth 3 (three) times daily as needed for dizziness. 02/20/21   Raulkar, Clide Deutscher, MD  melatonin 3 MG TABS tablet TAKE 2 TABLETS (6 MG  TOTAL) BY MOUTH AT BEDTIME. Patient not taking: Reported on 01/31/2021 08/11/20   Meccariello, Bernita Raisin, DO  metoCLOPramide (REGLAN) 5 MG tablet TAKE 1 TABLET (5 MG TOTAL) BY MOUTH 4 TIMES DAILY. 03/12/21   Raulkar, Clide Deutscher, MD  metoprolol succinate (TOPROL-XL) 50 MG 24 hr tablet Take 1 tablet (50 mg total) by mouth daily. Take with or immediately following a meal. 11/24/20 02/22/21  Enzo Bi, MD  nitroGLYCERIN (NITROSTAT) 0.4 MG SL tablet Place 1 tablet (0.4 mg total) under the tongue every 5 (five) minutes as needed for chest pain. Patient not taking: Reported on 01/31/2021 12/13/18   [name], MD  NOVOLOG FLEXPEN 100 UNIT/ML FlexPen Inject 30 Units into the skin in the morning, at noon, in the evening, and at bedtime. 11/24/20   Enzo Bi, MD  ondansetron (ZOFRAN ODT) 4 MG disintegrating tablet Take 1 tablet (4 mg total) by mouth every 8 (eight) hours as needed for nausea or vomiting. 09/14/20   Fondaw, Kathleene Hazel, PA  pantoprazole (PROTONIX) 40 MG tablet TAKE 1 TABLET (40 MG TOTAL) BY MOUTH 2 (TWO) TIMES DAILY. (AM+BEDTIME) 04/08/21   Raulkar, Clide Deutscher, MD  polyethylene glycol (MIRALAX / GLYCOLAX) 17 g packet Take 34 g by mouth daily. Patient not taking: Reported on 01/31/2021 11/24/20   Enzo Bi, MD  spironolactone (ALDACTONE) 25 MG tablet Take 12.5 mg by mouth daily. 01/23/21   [provider]  tiZANidine (ZANAFLEX) 4 MG tablet Take 4 mg by mouth 3 (three) times daily as needed for muscle spasms. 08/11/20   [provider]  torsemide (DEMADEX) 20 MG tablet Take 2 tablets (40 mg total) by mouth daily. Patient not taking: Reported on 01/31/2021 11/28/20 02/26/21  Enzo Bi, MD  trimethobenzamide Sherre Poot) 300 MG capsule Take 1 capsule (300 mg total) by mouth 3 (three) times daily as needed for nausea/vomiting. 01/31/21   Jeanell Sparrow, DO  vitamin B-12 1000 MCG tablet Take 1 tablet (1,000 mcg total) by mouth daily. Can take any over-the-counter Vit B12 supplement. Patient not  taking: Reported on 01/31/2021 11/24/20   Enzo Bi, MD      Allergies    Diazepam, Gabapentin, Iodinated contrast media, Isovue [iopamidol], Lisinopril, Metoclopramide, Nsaids, Penicillins, Tolmetin, Cyclobenzaprine, Rifamycins, Acetaminophen, Oxycodone, and Tramadol    Review of Systems   Review of Systems  Gastrointestinal:  Positive for abdominal pain.  All other systems reviewed and are negative.  Physical Exam Updated Vital Signs BP (!) 184/127    Pulse (!) 104    Temp 98.5 F (36.9 C) (Oral)    Resp 18    LMP 10/10/2012    SpO2 100%  Physical Exam Vitals and nursing note reviewed.  Constitutional:      General: She is in acute distress.     Appearance: Normal appearance.  HENT:     Head: Normocephalic  and atraumatic.  Eyes:     Conjunctiva/sclera: Conjunctivae normal.     Pupils: Pupils are equal, round, and reactive to light.  Cardiovascular:     Rate and Rhythm: Normal rate and regular rhythm.     Heart sounds: Normal heart sounds.  Pulmonary:     Effort: Pulmonary effort is normal. No respiratory distress.     Breath sounds: Normal breath sounds.  Abdominal:     General: There is no distension.     Palpations: Abdomen is soft.     Tenderness: There is generalized abdominal tenderness.  Musculoskeletal:        General: No deformity. Normal range of motion.     Cervical back: Normal range of motion and neck supple.  Skin:    General: Skin is warm and dry.  Neurological:     General: No focal deficit present.     Mental Status: She is alert and oriented to person, place, and time.    ED Results / Procedures / Treatments   Labs (all labs ordered are listed, but only abnormal results are displayed) Labs Reviewed  COMPREHENSIVE METABOLIC PANEL - Abnormal; Notable for the following components:      Result Value   Potassium 3.4 (*)    CO2 17 (*)    Glucose, Bld 245 (*)    BUN 38 (*)    Creatinine, Ser 2.54 (*)    Calcium 8.6 (*)    Total Protein 8.4 (*)     Albumin 3.2 (*)    Total Bilirubin 0.2 (*)    GFR, Estimated 21 (*)    All other components within normal limits  CBC WITH DIFFERENTIAL/PLATELET - Abnormal; Notable for the following components:   WBC 14.6 (*)    RBC 2.79 (*)    Hemoglobin 8.0 (*)    HCT 26.3 (*)    RDW 15.9 (*)    Neutro Abs 10.2 (*)    All other components within normal limits  RESP PANEL BY RT-PCR (FLU A&B, COVID) ARPGX2  LIPASE, BLOOD  URINALYSIS, ROUTINE W REFLEX MICROSCOPIC  RAPID URINE DRUG SCREEN, HOSP PERFORMED    EKG EKG Interpretation  Date/Time:  Monday April 13 2021 09:18:36 EST Ventricular Rate:  117 PR Interval:  160 QRS Duration: 86 QT Interval:  323 QTC Calculation: 449 R Axis:   -31 Text Interpretation: Sinus tachycardia Left axis deviation Confirmed by Dene Gentry [phone]) on 04/13/2021 9:28:32 AM  Radiology CT Abdomen Pelvis Wo Contrast  Result Date: 04/13/2021 CLINICAL DATA:  56 year old female with history of generalized abdominal pain. EXAM: CT ABDOMEN AND PELVIS WITHOUT CONTRAST TECHNIQUE: Multidetector CT imaging of the abdomen and pelvis was performed following the standard protocol without IV contrast. RADIATION DOSE REDUCTION: This exam was performed according to the departmental dose-optimization program which includes automated exposure control, adjustment of the mA and/or kV according to patient size and/or use of iterative reconstruction technique. COMPARISON:  CT the abdomen and pelvis 01/31/2021. FINDINGS: Lower chest: Unremarkable. Hepatobiliary: No definite suspicious cystic or solid hepatic lesions confidently identified on today's noncontrast CT examination. Status post cholecystectomy. Pancreas: No definite pancreatic mass or peripancreatic fluid collections or inflammatory changes are noted on today's noncontrast CT examination. Spleen: Unremarkable. Adrenals/Urinary Tract: There are no abnormal calcifications within the collecting system of either kidney, along the course of  either ureter, or within the lumen of the urinary bladder. No hydroureteronephrosis or perinephric stranding to suggest urinary tract obstruction at this time. The unenhanced appearance of the kidneys is  unremarkable bilaterally. Unenhanced appearance of the urinary bladder is normal. Bilateral adrenal glands are normal in appearance. Stomach/Bowel: Several metallic densities are noted in the antral pre-pyloric region of the stomach, likely biopsy clips. Unenhanced appearance of the stomach is otherwise normal. No pathologic dilatation of small bowel or colon. Normal appendix. Vascular/Lymphatic: Aortic atherosclerosis. No lymphadenopathy noted in the abdomen or pelvis. Reproductive: Exophytic lesion in the left side of the uterine fundus measuring 4.9 cm in diameter, similar to the prior examination, likely to represent an exophytic fibroid subserosal. This obscures the left ovary. Right ovary is unremarkable in appearance. Other: No significant volume of ascites.  No pneumoperitoneum. Musculoskeletal: There are no aggressive appearing lytic or blastic lesions noted in the visualized portions of the skeleton. IMPRESSION: 1. No acute findings are noted in the abdomen or pelvis to account for the patient's symptoms. 2. Similar exophytic lesion associated with the left side of the uterine fundus, favored to represent an exophytic fibroid, less likely a left adnexal lesion. This could be better evaluated with follow-up nonemergent pelvic ultrasound if of clinical concern. 3. Aortic atherosclerosis. 4. Additional incidental findings, as above. Electronically Signed   By: Vinnie Langton M.D.   On: 04/13/2021 10:55    Procedures Procedures    Medications Ordered in ED Medications  0.9 %  sodium chloride infusion (has no administration in time range)  HYDROmorphone (DILAUDID) injection 1 mg (has no administration in time range)  HYDROmorphone (DILAUDID) injection 1 mg (1 mg Intravenous Given 04/13/21 0917)   ondansetron (ZOFRAN) injection 4 mg (4 mg Intravenous Given 04/13/21 0916)  sodium chloride 0.9 % bolus 500 mL (0 mLs Intravenous Stopped 04/13/21 1104)    ED Course/ Medical Decision Making/ A&P                           Medical Decision Making Amount and/or Complexity of Data Reviewed Labs: ordered. Radiology: ordered.  Risk Prescription drug management.    Medical Screen Complete  This patient presented to the ED with complaint of nausea, vomiting, abdominal discomfort.  This complaint involves an extensive number of treatment options. The initial differential diagnosis includes, but is not limited to, gastroparesis, intra-abdominal pathology, metabolic abnormality,etc  This presentation is: Acute, Chronic, Self-Limited, Previously Undiagnosed, Uncertain Prognosis, Complicated, Systemic Symptoms, and Threat to Life/Bodily Function  Patient presents with complaint of nausea and vomiting with associated abdominal discomfort.  Patient with known history of gastroparesis.  Labs obtained today are significant for elevated creatinine to 2.5.  Baseline creatinine appears to be 1.9.  CT abdomen pelvis without evidence of significant acute pathology.  Patient is moderately improved with administration of Dilaudid and Zofran.  However, patient would likely benefit from admission for further work-up and treatment.  Hospitalist service is aware of case and evaluate for admission.    Co morbidities that complicated the patient's evaluation  Obesity, diabetes, gastroparesis, CHF, CKD   Additional history obtained:  External records from outside sources obtained and reviewed including prior ED visits and prior Inpatient records.    Lab Tests:  I ordered and personally interpreted labs.  The pertinent results include:  cbc cmp lipase    Imaging Studies ordered:  I ordered imaging studies including ct ap  I independently visualized and interpreted obtained imaging which  showed NAD I agree with the radiologist interpretation.   Cardiac Monitoring:  The patient was maintained on a cardiac monitor.  I personally viewed and interpreted the cardiac monitor which  showed an underlying rhythm of: NSR   Medicines ordered:  I ordered medication including dilaudid, zofran  for pain, nausea  Reevaluation of the patient after these medicines showed that the patient: improved     Problem List / ED Course:  Nausea, vomiting, abdominal pain   Reevaluation:  After the interventions noted above, I reevaluated the patient and found that they have: improved    Disposition:  After consideration of the diagnostic results and the patients response to treatment, I feel that the patent would benefit from admission.           Final Clinical Impression(s) / ED Diagnoses Final diagnoses:  Generalized abdominal pain  Nausea  Vomiting, unspecified vomiting type, unspecified whether nausea present  AKI (acute kidney injury) [facility])    Rx / DC Orders ED Discharge Orders     None         Valarie Merino, MD 04/13/21 1217

## 2021-04-13 NOTE — H&P (Signed)
History and Physical    Patient: Deborah [name] JGG:[id number] DOB: Sep 14, 1965 DOA: 04/13/2021 DOS: the patient was seen and examined on 04/13/2021 PCP: Cena Benton, MD  Patient coming from: Home  Chief Complaint:  Chief Complaint  Patient presents with   Abdominal Pain    HPI: Deborah [name] is a 56 y.o. female with medical history significant of chronic pain, DM2, DM gastroparesis, HFrEF, A fib on eliquis, CKD3b. Presenting with N/V, abdominal pain. N/V started yesterday. Tried zofran, but it didn't help. She didn't notice any hematemesis. She was able to have a BM yesterday. Stomach pain started this morning. It was global and stabbing. Constant pain. Didn't try any medicines or therapies to help that. She became concerned and came to the ED for assistance.    Of note, recent pyloromyotomy procedure on 03/27/21 in [place] for DM gastroparesis.Patient underwent upper GI series on 03/28/2021 which demonstrated no leak. She was then transition to clears which she tolerated well.  She otherwise denies any other aggravating or alleviating factors.   Review of Systems: As mentioned in the history of present illness. All other systems reviewed and are negative. Past Medical History:  Diagnosis Date   Acute back pain with sciatica, left    Acute back pain with sciatica, right    AKI (acute kidney injury) ([place])    Anemia, unspecified    Cancer (HCC)    Carcinoid tumor of duodenum    Chest pain with normal coronary angiography 2019   Chronic kidney disease, stage 3b (HCC)    Chronic pain    Chronic systolic CHF (congestive heart failure) ([place])    Diabetes mellitus    DKA (diabetic ketoacidosis) ([place])    Drug-seeking behavior    21 hospitalizations and 14 CT a/p in 2 years for N/V and abdominal pain, demanding only IV dilaudid   Elevated troponin    chronic   Esophageal reflux    Fibromyalgia    Gastric ulcer    Gastroparesis    Gout    Hyperlipidemia     Hypertension    Hypokalemia    Hypomagnesemia    Lumbosacral stenosis    LVH (left ventricular hypertrophy)    Morbid obesity (HCC)    NICM (nonischemic cardiomyopathy) (HCC)    PAF (paroxysmal atrial fibrillation) ([place])    Stroke ([place]) 02/2011   Thrombocytosis    Vitamin B12 deficiency anemia    Past Surgical History:  Procedure Laterality Date   BIOPSY  07/27/2019   Procedure: BIOPSY;  Surgeon: Clarene Essex, MD;  Location: [facility];  Service: Endoscopy;;   BIOPSY  07/30/2019   Procedure: BIOPSY;  Surgeon: Otis Brace, MD;  Location: [facility];  Service: Gastroenterology;;   CATARACT EXTRACTION  01/2014   CHOLECYSTECTOMY     COLONOSCOPY WITH PROPOFOL N/A 07/30/2019   Procedure: COLONOSCOPY WITH PROPOFOL;  Surgeon: Otis Brace, MD;  Location: [facility];  Service: Gastroenterology;  Laterality: N/A;   ESOPHAGOGASTRODUODENOSCOPY N/A 07/27/2019   Procedure: ESOPHAGOGASTRODUODENOSCOPY (EGD);  Surgeon: Clarene Essex, MD;  Location: [facility];  Service: Endoscopy;  Laterality: N/A;   ESOPHAGOGASTRODUODENOSCOPY N/A 07/26/2020   Procedure: ESOPHAGOGASTRODUODENOSCOPY (EGD);  Surgeon: Arta Silence, MD;  Location: [facility];  Service: Endoscopy;  Laterality: N/A;   ESOPHAGOGASTRODUODENOSCOPY (EGD) WITH PROPOFOL N/A 08/02/2019   Procedure: ESOPHAGOGASTRODUODENOSCOPY (EGD) WITH PROPOFOL;  Surgeon: Otis Brace, MD;  Location: [facility];  Service: Gastroenterology;  Laterality: N/A;   HEMOSTASIS CLIP PLACEMENT  08/02/2019   Procedure: HEMOSTASIS CLIP PLACEMENT;  Surgeon: Alessandra Bevels,  Orson Gear, MD;  Location: [facility];  Service: Gastroenterology;;   POLYPECTOMY  07/30/2019   Procedure: POLYPECTOMY;  Surgeon: Otis Brace, MD;  Location: [facility];  Service: Gastroenterology;;   POLYPECTOMY  08/02/2019   Procedure: POLYPECTOMY;  Surgeon: Otis Brace, MD;  Location: [facility];  Service: Gastroenterology;;   Social History:  reports that she has never  smoked. She has never used smokeless tobacco. She reports that she does not drink alcohol and does not use drugs.  Allergies  Allergen Reactions   Diazepam [name]   Gabapentin [name] and Swelling    Other reaction(s): Unknown   Iodinated Contrast Media Anaphylaxis    11/29/17 Cardiac arrest 1 min after IV contrast, possible allergy vs vasovagal episode Iopamidol  Anaphylaxis  High 11/28/2017  Patient had seizure like activity and then code post 100 cc of isovue 300     Isovue [Iopamidol] Anaphylaxis    11/28/17 Patient had seizure like activity and then 1 min code after 100 cc of isovue 300. Possible contrast allergy vs vasovagal episode   Lisinopril Anaphylaxis    Tongue and mouth swelling   Metoclopramide Other (See Comments)    Tardive dyskinesia   Nsaids Anaphylaxis and Other (See Comments)    ULCER   Penicillins Palpitations    Has patient had a PCN reaction causing immediate rash, facial/tongue/throat swelling, SOB or lightheadedness with hypotension: Yes, heart races Has patient had a PCN reaction causing severe rash involving mucus membranes or skin necrosis: No Has patient had a PCN reaction that required hospitalization: Yes  Has patient had a PCN reaction occurring within the last 10 years: No    Tolmetin Nausea Only and Other (See Comments)    ULCER   Cyclobenzaprine     Other reaction(s): Unknown   Rifamycins Other (See Comments)    unknown   Acetaminophen Nausea Only and Other (See Comments)    Irritates stomach ulcer; Abdominal pain   Oxycodone Palpitations   Tramadol Nausea And Vomiting    Family History  Problem Relation Age of Onset   Diabetes Mother    Diabetes Father    Heart disease Father    Diabetes Sister    Congestive Heart Failure Sister 42   Diabetes Brother     Prior to Admission medications   Medication Sig Start Date End Date Taking? Authorizing Provider  Acetylcysteine (NAC) 600 MG CAPS Take 1 capsule (600 mg  total) by mouth 2 (two) times daily. 02/20/21   Raulkar, Clide Deutscher, MD  albuterol (PROVENTIL) (2.5 MG/3ML) 0.083% nebulizer solution Take 3 mLs (2.5 mg total) by nebulization every 6 (six) hours as needed for wheezing or shortness of breath. Patient not taking: Reported on 01/31/2021 04/06/19   Scot Jun, FNP  atorvastatin (LIPITOR) 10 MG tablet TAKE ONE TABLET BY MOUTH ONCE DAILY (AM) Patient taking differently: Take 10 mg by mouth daily. 10/22/20   Alcus Dad, MD  baclofen (LIORESAL) 10 MG tablet Take 1 tablet (10 mg total) by mouth 3 (three) times daily. 02/20/21   Raulkar, Clide Deutscher, MD  blood glucose meter kit and supplies KIT Dispense based on patient and insurance preference. Use up to four times daily as directed. (FOR ICD-9 250.00, 250.01). 12/13/18   [name], MD  cetirizine (ZYRTEC) 10 MG tablet Take 1 tablet (10 mg total) by mouth daily. 03/11/21   Raulkar, Clide Deutscher, MD  DULoxetine HCl 40 MG CPEP Take 40 mg by mouth in the morning and at bedtime. 03/11/21  Raulkar, Clide Deutscher, MD  EASY COMFORT PEN NEEDLES 31G X 5 MM MISC USE 3 TIMES A DAY FOR INSULIN ADMINISTRATION 11/14/19   Meccariello, Mel Almond J, DO  ELIQUIS 5 MG TABS tablet TAKE 1 TABLET BY MOUTH 2 (TWO) TIMES DAILY. Patient not taking: Reported on 01/31/2021 12/20/19   Meccariello, Bernita Raisin, DO  fluticasone (FLONASE) 50 MCG/ACT nasal spray Place 2 sprays into both nostrils daily as needed for allergies or rhinitis. 12/19/18   Rai, Vernelle Emerald, MD  hydrALAZINE (APRESOLINE) 50 MG tablet Take 1 tablet (50 mg total) by mouth 3 (three) times daily. 03/11/21   Raulkar, Clide Deutscher, MD  HYDROmorphone (DILAUDID) 4 MG tablet Take 1 tablet (4 mg total) by mouth 2 (two) times daily as needed for severe pain. 02/20/21   Raulkar, Clide Deutscher, MD  isosorbide mononitrate (IMDUR) 60 MG 24 hr tablet TAKE 1 TABLET (60 MG TOTAL) BY MOUTH DAILY (AM) 04/08/21   Raulkar, Clide Deutscher, MD  LANTUS SOLOSTAR 100 UNIT/ML Solostar Pen Inject 50 Units into the  skin at bedtime. 11/24/20 01/31/22  Enzo Bi, MD  meclizine (ANTIVERT) 25 MG tablet Take 1 tablet (25 mg total) by mouth 3 (three) times daily as needed for dizziness. 02/20/21   Raulkar, Clide Deutscher, MD  melatonin 3 MG TABS tablet TAKE 2 TABLETS (6 MG TOTAL) BY MOUTH AT BEDTIME. Patient not taking: Reported on 01/31/2021 08/11/20   Meccariello, Bernita Raisin, DO  metoCLOPramide (REGLAN) 5 MG tablet TAKE 1 TABLET (5 MG TOTAL) BY MOUTH 4 TIMES DAILY. 03/12/21   Raulkar, Clide Deutscher, MD  metoprolol succinate (TOPROL-XL) 50 MG 24 hr tablet Take 1 tablet (50 mg total) by mouth daily. Take with or immediately following a meal. 11/24/20 02/22/21  Enzo Bi, MD  nitroGLYCERIN (NITROSTAT) 0.4 MG SL tablet Place 1 tablet (0.4 mg total) under the tongue every 5 (five) minutes as needed for chest pain. Patient not taking: Reported on 01/31/2021 12/13/18   [name], MD  NOVOLOG FLEXPEN 100 UNIT/ML FlexPen Inject 30 Units into the skin in the morning, at noon, in the evening, and at bedtime. 11/24/20   Enzo Bi, MD  ondansetron (ZOFRAN ODT) 4 MG disintegrating tablet Take 1 tablet (4 mg total) by mouth every 8 (eight) hours as needed for nausea or vomiting. 09/14/20   Fondaw, Kathleene Hazel, PA  pantoprazole (PROTONIX) 40 MG tablet TAKE 1 TABLET (40 MG TOTAL) BY MOUTH 2 (TWO) TIMES DAILY. (AM+BEDTIME) 04/08/21   Raulkar, Clide Deutscher, MD  polyethylene glycol (MIRALAX / GLYCOLAX) 17 g packet Take 34 g by mouth daily. Patient not taking: Reported on 01/31/2021 11/24/20   Enzo Bi, MD  spironolactone (ALDACTONE) 25 MG tablet Take 12.5 mg by mouth daily. 01/23/21   [provider]  tiZANidine (ZANAFLEX) 4 MG tablet Take 4 mg by mouth 3 (three) times daily as needed for muscle spasms. 08/11/20   [provider]  torsemide (DEMADEX) 20 MG tablet Take 2 tablets (40 mg total) by mouth daily. Patient not taking: Reported on 01/31/2021 11/28/20 02/26/21  Enzo Bi, MD  trimethobenzamide Sherre Poot) 300 MG capsule Take 1 capsule  (300 mg total) by mouth 3 (three) times daily as needed for nausea/vomiting. 01/31/21   Jeanell Sparrow, DO  vitamin B-12 1000 MCG tablet Take 1 tablet (1,000 mcg total) by mouth daily. Can take any over-the-counter Vit B12 supplement. Patient not taking: Reported on 01/31/2021 11/24/20   Enzo Bi, MD    Physical Exam: Vitals:   04/13/21 1100 04/13/21 1149 04/13/21  1154 04/13/21 1209  BP: (!) 184/127 113/67  (!) 108/45  Pulse: (!) 104 (!) 104 (!) 102 (!) 102  Resp: 18   18  Temp:      TempSrc:      SpO2: 100% 100% 100% 99%   General: 56 y.o. female resting in bed in NAD Eyes: PERRL, normal sclera ENMT: Nares patent w/o discharge, orophaynx clear, dentition normal, ears w/o discharge/lesions/ulcers Neck: Supple, trachea midline Cardiovascular: RRR, +S1, S2, no m/g/r, equal pulses throughout Respiratory: CTABL, no w/r/r, normal WOB GI: BS+, ND, mild TTP RUQ/LUQ, no masses noted, no organomegaly noted MSK: No e/c/c Neuro: A&O x 3, RUE/RLE msk str 4/5 Psyc: Appropriate interaction but flat affect, calm/cooperative   Data Reviewed:  K+  3.4 SCr  2.54 Glucose  245 Hgb 8.0 WBC 14.6  Assessment and Plan: No notes have been filed under this hospital service. Service: Hospitalist Abdominal pain N/V DM gastroparesis     - admit to inpt, tele     - fluids, anti-emetics pain control     - recent pyloromyotomy (03/27/21) in [place], [place]. Upper GI done the next day and was without leaks; she was able to be discharged and tolerated her diet until yesterday     - Non-con CT ab/pelvis is negative for acute process     - spoke with [facility], rec'd Upper GI series      - also check UA  DM2     - NPO except for ice chips for right now     - check A1c     - glucose checks, SSI     - resume home LA insulin at reduced dose  AKI on CKD3b     - fluids     - CT negative for renal obstruction     - watch nephrotoxins  Hypokalemia     - replace K+, check Mg2+  Normocytic anemia     -  no evidence of bleed, check iron studies  Hx of CVA     - recent CVA w/ right side residuals     - was suppose to have HHPT/OT, but has not been arranged yet     - have PT/OT eval here  Chronic systolic HF     - holding diuretic for today d/t AKI     - gentle fluids     - watch daily wts, I&O  HTN     - resume home regimen minus nephrotoxins   Afib on eliquis     - continue eliquis, metoprolol  Advance Care Planning: FULL  Consults: [facility]  Family Communication: None at bedside  Severity of Illness: The appropriate patient status for this patient is INPATIENT. Inpatient status is judged to be reasonable and necessary in order to provide the required intensity of service to ensure the patient's safety. The patient's presenting symptoms, physical exam findings, and initial radiographic and laboratory data in the context of their chronic comorbidities is felt to place them at high risk for further clinical deterioration. Furthermore, it is not anticipated that the patient will be medically stable for discharge from the hospital within 2 midnights of admission.   * I certify that at the point of admission it is my clinical judgment that the patient will require inpatient hospital care spanning beyond 2 midnights from the point of admission due to high intensity of service, high risk for further deterioration and high frequency of surveillance required.*  Author: Jonnie Finner, DO 04/13/2021  12:31 PM  For on call review [email or web address].

## 2021-04-14 ENCOUNTER — Encounter: Payer: Medicare Other | Admitting: Registered Nurse

## 2021-04-14 DIAGNOSIS — K5903 Drug induced constipation: Secondary | ICD-10-CM

## 2021-04-14 DIAGNOSIS — E1143 Type 2 diabetes mellitus with diabetic autonomic (poly)neuropathy: Secondary | ICD-10-CM

## 2021-04-14 DIAGNOSIS — K3184 Gastroparesis: Secondary | ICD-10-CM

## 2021-04-14 LAB — CBC
HCT: 24 % — ABNORMAL LOW (ref 36.0–46.0)
Hemoglobin: 7.3 g/dL — ABNORMAL LOW (ref 12.0–15.0)
MCH: 29.1 pg (ref 26.0–34.0)
MCHC: 30.4 g/dL (ref 30.0–36.0)
MCV: 95.6 fL (ref 80.0–100.0)
Platelets: 359 10*3/uL (ref 150–400)
RBC: 2.51 MIL/uL — ABNORMAL LOW (ref 3.87–5.11)
RDW: 16.1 % — ABNORMAL HIGH (ref 11.5–15.5)
WBC: 14.7 10*3/uL — ABNORMAL HIGH (ref 4.0–10.5)
nRBC: 0 % (ref 0.0–0.2)

## 2021-04-14 LAB — IRON AND TIBC
Iron: 17 ug/dL — ABNORMAL LOW (ref 28–170)
Saturation Ratios: 6 % — ABNORMAL LOW (ref 10.4–31.8)
TIBC: 308 ug/dL (ref 250–450)
UIBC: 291 ug/dL

## 2021-04-14 LAB — FOLATE: Folate: 12.3 ng/mL (ref >5.9)

## 2021-04-14 LAB — GLUCOSE, CAPILLARY
Glucose-Capillary: 154 mg/dL — ABNORMAL HIGH (ref 70–99)
Glucose-Capillary: 200 mg/dL — ABNORMAL HIGH (ref 70–99)
Glucose-Capillary: 260 mg/dL — ABNORMAL HIGH (ref 70–99)
Glucose-Capillary: 132 mg/dL — ABNORMAL HIGH (ref 70–99)

## 2021-04-14 LAB — FERRITIN: Ferritin: 511 ng/mL — ABNORMAL HIGH (ref 11–307)

## 2021-04-14 LAB — COMPREHENSIVE METABOLIC PANEL
ALT: 22 U/L (ref 0–44)
AST: 20 U/L (ref 15–41)
Albumin: 3.4 g/dL — ABNORMAL LOW (ref 3.5–5.0)
Alkaline Phosphatase: 80 U/L (ref 38–126)
Anion gap: 11 (ref 5–15)
BUN: 40 mg/dL — ABNORMAL HIGH (ref 6–20)
CO2: 14 mmol/L — ABNORMAL LOW (ref 22–32)
Calcium: 8.6 mg/dL — ABNORMAL LOW (ref 8.9–10.3)
Chloride: 112 mmol/L — ABNORMAL HIGH (ref 98–111)
Creatinine, Ser: 2.07 mg/dL — ABNORMAL HIGH (ref 0.44–1.00)
GFR, Estimated: 27 mL/min — ABNORMAL LOW (ref >60)
Glucose, Bld: 175 mg/dL — ABNORMAL HIGH (ref 70–99)
Potassium: 4.4 mmol/L (ref 3.5–5.1)
Sodium: 137 mmol/L (ref 135–145)
Total Bilirubin: 0.4 mg/dL (ref 0.3–1.2)
Total Protein: 7.9 g/dL (ref 6.5–8.1)

## 2021-04-14 LAB — VITAMIN B12: Vitamin B-12: 199 pg/mL (ref 180–914)

## 2021-04-14 MED ORDER — LACTULOSE 10 GM/15ML PO SOLN
20.0000 g | Freq: Two times a day (BID) | ORAL | Status: DC
  Administered 2021-04-18 – 2021-04-20 (×3): 20 g via ORAL
  Filled 2021-04-14 (×8): qty 30

## 2021-04-14 MED ORDER — SENNOSIDES-DOCUSATE SODIUM 8.6-50 MG PO TABS
2.0000 | ORAL_TABLET | Freq: Two times a day (BID) | ORAL | Status: DC
  Administered 2021-04-14 – 2021-04-20 (×4): 2 via ORAL
  Filled 2021-04-14 (×8): qty 2

## 2021-04-14 MED ORDER — POLYETHYLENE GLYCOL 3350 17 G PO PACK
17.0000 g | PACK | Freq: Two times a day (BID) | ORAL | Status: DC
  Administered 2021-04-20 – 2021-04-21 (×2): 17 g via ORAL
  Filled 2021-04-14 (×8): qty 1

## 2021-04-14 MED ORDER — BISACODYL 10 MG RE SUPP
10.0000 mg | Freq: Every day | RECTAL | Status: DC
  Administered 2021-04-16: 10 mg via RECTAL
  Filled 2021-04-14 (×5): qty 1

## 2021-04-14 MED ORDER — INSULIN GLARGINE-YFGN 100 UNIT/ML ~~LOC~~ SOLN
20.0000 [IU] | Freq: Every day | SUBCUTANEOUS | Status: DC
  Administered 2021-04-14 – 2021-04-20 (×5): 20 [IU] via SUBCUTANEOUS
  Filled 2021-04-14 (×8): qty 0.2

## 2021-04-14 MED ORDER — BISACODYL 10 MG RE SUPP: 10.0000 mg | Freq: Every day | RECTAL | Status: DC

## 2021-04-14 MED ORDER — LINACLOTIDE 145 MCG PO CAPS
145.0000 ug | ORAL_CAPSULE | Freq: Every day | ORAL | Status: DC
  Filled 2021-04-14 (×7): qty 1

## 2021-04-14 NOTE — Evaluation (Signed)
Physical Therapy Evaluation Only Patient Details Name: Deborah [name] MRN: [id number] DOB: 1966-02-14 Today's Date: 04/14/2021  History of Present Illness  [name] is a 56 y.o. female presents with nausea and vomiting and abdominal discomfort. CT abdomen pelvis without evidence of significant acute pathology. Of note, pyloromyotomy procedure on 03/27/21 in [place] for DM gastroparesis. PMH: afib, diabetes, CHF, CVA, CKD, fibromyalgia, HTN   Clinical Impression  Pt from home with spouse present 24/7, using w/c all the time, reports spouse assists "with whatever I need", BSC has recently broken and needs a new one. Pt states pain in BLE is too high to mobilize for AROM testing, but able to reposition BLE in bed to comfort without assistance or difficulty. Pt declines strength testing or supine<>sit mobility due to pain. Attempted to educate pt on mobility, therapeutic process, time OOB with guarded carryover. Pt tearful regarding lack of pain medication, provided encouragement and notified RN. Cognitively, pt initially states "I don't know" when asked the date, then able to state year is 2013 and age is 50, reoriented pt to year being 2023 and pt being 55 years old and pt nods head yes. Pt declines acute PT, doesn't want to mobilize while in hospital due to fluid on legs, but agreeable to therapy at home. Per nurse, pt was up to [facility] earlier today with minimal assist. Will sign off at this time, please re-consult if needs arise.     Recommendations for follow up therapy are one component of a multi-disciplinary discharge planning process, led by the attending physician.  Recommendations may be updated based on patient status, additional functional criteria and insurance authorization.  Follow Up Recommendations Home health PT    Assistance Recommended at Discharge Frequent or constant Supervision/Assistance  Patient can return home with the following  A little help with walking  and/or transfers;A little help with bathing/dressing/bathroom;Assistance with cooking/housework;Assist for transportation    Equipment Recommendations BSC/3in1  Recommendations for Other Services       Functional Status Assessment Patient has had a recent decline in their functional status and demonstrates the ability to make significant improvements in function in a reasonable and predictable amount of time.     Precautions / Restrictions Precautions Precautions: Fall Restrictions Weight Bearing Restrictions: No      Mobility  Bed Mobility Overal bed mobility: Needs Assistance Bed Mobility: Rolling Rolling: Modified independent (Device/Increase time)  General bed mobility comments: pt rolls from supine into R sidelying to retrieve objects from tray table without assist; pt refuses supine<>sit due to pain    Transfers  General transfer comment: pt refuses mobility due to pain, reports she was up with nursing earlier to go to [facility]    Ambulation/Gait   Stairs            Wheelchair Mobility    Modified Rankin (Stroke Patients Only)       Balance       Pertinent Vitals/Pain Pain Assessment Pain Assessment: Faces Faces Pain Scale: Hurts whole lot Pain Location: BLE and abdomen Pain Descriptors / Indicators:  ("there is too much fluid on my legs") Pain Intervention(s): Limited activity within patient's tolerance, Monitored during session    Home Living Family/patient expects to be discharged to:: Private residence Living Arrangements: Spouse/significant other Available Help at Discharge: Family;Available 24 hours/day Type of Home: Apartment Home Access: Level entry  Home Layout: One level Home Equipment: BSC/3in1;Shower seat;Wheelchair - manual Additional Comments: pt reports BSC is broken and needs a new one  Prior Function Prior Level of Function : Needs assist  Mobility Comments: pt reports using w/c at all times, doesn't ambulate at home, reports  spouse assists "with whatever I need" ADLs Comments: pt reports spouse assists "with whatever I need"     Hand Dominance        Extremity/Trunk Assessment   Upper Extremity Assessment Upper Extremity Assessment: Overall WFL for tasks assessed    Lower Extremity Assessment Lower Extremity Assessment: RLE deficits/detail;LLE deficits/detail RLE Deficits / Details: toes and ankle AROM WNL, mobilizing in bed without assistance to reposition to comfort, adamant therapist should not touch her legs due to pain RLE: Unable to fully assess due to pain LLE Deficits / Details: toes and ankle AROM WNL, mobilizing in bed without assistance to reposition to comfort, adamant therapist should not touch her legs due to pain LLE: Unable to fully assess due to pain       Communication   Communication: No difficulties  Cognition Arousal/Alertness: Awake/alert Behavior During Therapy: WFL for tasks assessed/performed Overall Cognitive Status: No family/caregiver present to determine baseline cognitive functioning  General Comments: pt answers questions appropriately, becomes tearful when explaining she hasn't gotten pain medication and fluid on her legs is painful, pt states name appropriately but states the year is 2013 and age is 56 years old        General Comments      Exercises     Assessment/Plan    PT Assessment All further PT needs can be met in the next venue of care  PT Problem List Decreased strength;Decreased activity tolerance;Decreased balance;Decreased mobility;Decreased cognition;Decreased knowledge of use of DME;Obesity;Pain       PT Treatment Interventions      PT Goals (Current goals can be found in the Care Plan section)  Acute Rehab PT Goals Patient Stated Goal: "I need to get the fluid off my legs" PT Goal Formulation: All assessment and education complete, DC therapy    Frequency       Co-evaluation               AM-PAC PT "6 Clicks" Mobility   Outcome Measure Help needed turning from your back to your side while in a flat bed without using bedrails?: None Help needed moving from lying on your back to sitting on the side of a flat bed without using bedrails?: A Little Help needed moving to and from a bed to a chair (including a wheelchair)?: A Little Help needed standing up from a chair using your arms (e.g., wheelchair or bedside chair)?: A Little Help needed to walk in hospital room?: Total Help needed climbing 3-5 steps with a railing? : Total 6 Click Score: 15    End of Session   Activity Tolerance: Patient limited by pain Patient left: in bed;with call bell/phone within reach;with bed alarm set Nurse Communication: Mobility status PT Visit Diagnosis: Muscle weakness (generalized) (M62.81);Pain Pain - part of body:  (BLE)    Time: 5456-2563 PT Time Calculation (min) (ACUTE ONLY): 12 min   Charges:   PT Evaluation $PT Eval Low Complexity: 1 Low           Tori Kendall Justo PT, DPT 04/14/21, 9:19 AM

## 2021-04-14 NOTE — Progress Notes (Addendum)
End of shift  Pt refused morning meds, was able to transfer to the [facility] by herself, however had difficulty participating in neuro assessment.   Pt complained of 10 out of 10 pain in her abdomen until Dr Sabino Gasser advised her that she needed to have a BM because she was full of stool.  Pt refused suppository and enema.  Stated that the cranberry juice was working and moving her bowels.  Complaints of abdominal pain have ceased.  Pt diet advanced to clear liquid.  Pt sitting up in the chair.  Dr ordered senna, lactulose and miralax.  Pt only took senna and refused the other meds. Pt was asked prior to removing the medication if she would take them and she said yes.  Meds were scanned, pt had no questions and then pt refused to take them.  Medication wasted. Dr aware.  Pt did take afternoon dose of insulin and hydralazine.  Had a small brown liquid stool.  Anticipate discharge 2/8

## 2021-04-14 NOTE — Progress Notes (Signed)
Progress Note   Patient: Deborah [name] WUJ:[id number] DOB: 11/06/1965 DOA: 04/13/2021     1 DOS: the patient was seen and examined on 04/14/2021   Brief hospital course: Deborah [name] is a 56 y.o. female with medical history significant of chronic pain, DM2, DM gastroparesis, HFrEF, A fib on eliquis, CKD3b. Presenting with N/V, abdominal pain. N/V started day prior to admission. Tried zofran, but it didn't help. She didn't notice any hematemesis. Stomach pain started morning of admission Due to her pain she presented to the ER.   Of note, recent pyloromyotomy procedure on 03/27/21 in [place] for DM gastroparesis.Patient underwent upper GI series on 03/28/2021 which demonstrated no leak. She was then transitioned to clears which she tolerated well.   With further information from patient on 2/7, she states she reportedly was in the hospital in [facility] for approximately 3 weeks being treated with fentanyl and other opioids.  When asked about her last bowel movements, she is unable to adequately answer and constantly changing her answer.  Possibly even over a week prior to admission.  She also has answers that are almost tangential when being asked the question and she is constantly having to be redirected to the initial question.  She seems to continue to focus on "leg pain" but then changes her answers to having abdominal pain.   Assessment and Plan:  Abdominal pain N/V DM gastroparesis Constipation  - CT shows what appears to be stool burden throughout; her answer changes about last bowel movement but I am inclined to think it has been several days especially with recent prolonged hospitalization in [place] where she was given prolonged course of opioids as she reports -Database reviewed and she also is receiving Dilaudid 4 mg tablets (last filled 02/20/2021, quantity 60 for 30-day supply).  Her fill history is very consistent almost monthly. - per careeverywhere so does show  that she was hospitalized in [place] from 1/13 - 1/27 -My suspicion is still underlying constipation contributing to her symptoms notably the pain/distention as well as her nausea/vomiting and lack of bowel movements. -I strongly encouraged her for considering a course of laxatives including suppository and enema which she was amenable with however when nurse came around with medications, she refused -Patient does demonstrate some subtle pain seeking tendencies notably wanting Dilaudid and changing her answer where her pain is -Holding off on pain medication at this time as she does not appear to be in any overt distress during my exam and would also worsen underlying constipation; I do perceive an underlying psych component to some of this as well - restart on CLD and advance as tolearted - continue to offer suppository and/or enema; will also order some oral laxatives PRN in case she changes her mind   DM2 - A1c 7.5% on 04/13/21 -Continue SSI and CBG monitoring -Semglee ordered at 35 units daily; home dose is Lantus 50 units daily.  Will further decrease Semglee down to 20 units and can be further modified as needed   AKI on CKD3b - fluids but she did not want them to continue due to "swelling" - CT negative for renal obstruction   Hypokalemia -Replete as needed   Normocytic anemia - no evidence of bleed, check iron studies and folate/B12   Hx of CVA - recent CVA w/ right side residuals; epic reviewed, has negative CTH on 03/23/21, but no MRI for follow up due to body habitus (was seen by neurology) - can have PT/OT eval  Chronic systolic  HF -Initially treated with fluids due to worsened renal function which did improve; patient wished for fluids to be stopped due to worsening swelling     - holding diuretic for today d/t AKI   HTN - resume home regimen minus nephrotoxins   Afib - continue eliquis, metoprolol     Subjective: She adamantly refuses contrast and states that she  "died".  When discussing the upper GI series test that was ordered on admission she had spoken to radiology and despite explanation of water-soluble medium to be used, she was still against it. Therefore, we discussed trial of suppository and enema to help relieve her noted constipation on CT imaging.  She was amenable with this during evaluation but when nursing came with medication, she again refused.  She has a very odd affect, essentially tangential thought process and hints of pain seeking tendency.   Physical Exam: Vitals:   04/14/21 0500 04/14/21 1016 04/14/21 1051 04/14/21 1201  BP:  135/90 135/90 (!) 157/57  Pulse:  (!) 105 (!) 105 100  Resp:   (!) 21 18  Temp:   98.3 F (36.8 C) 98.3 F (36.8 C)  TempSrc:    Oral  SpO2:   100% 100%  Weight: 129.8 kg     Physical Exam Constitutional:      General: She is not in acute distress.    Appearance: Normal appearance. She is obese. She is not ill-appearing.  HENT:     Head: Normocephalic and atraumatic.     Mouth/Throat:     Mouth: Mucous membranes are moist.  Eyes:     Extraocular Movements: Extraocular movements intact.  Cardiovascular:     Rate and Rhythm: Normal rate and regular rhythm.  Pulmonary:     Effort: Pulmonary effort is normal.     Breath sounds: Normal breath sounds.  Abdominal:     Comments: Obese, soft, BS hypoactive. Not tender when pushing with stethoscope during auscultation but subjectively tender to percussion. No R/G  Musculoskeletal:        General: Swelling present. Normal range of motion.     Cervical back: Normal range of motion and neck supple.     Comments: 1-2+ LE edema  Skin:    General: Skin is warm and dry.  Neurological:     General: No focal deficit present.     Mental Status: She is alert.  Psychiatric:     Comments: Odd affect. Tangential thought process     Data Reviewed:  I have Reviewed nursing notes, Vitals, and Lab results since pt's last encounter. Pertinent lab results  creat 2.07, WBC 14.7, Hgb 7.3 I have ordered test including BMP, CBC, Mg I have reviewed the last note from staff over past 24 hours,  I have discussed pt's care plan and test results with nursing staff, CM.   Family Communication:   Disposition: Status is: Inpatient Remains inpatient appropriate because: Treatment as outlined in A&P      Planned Discharge Destination: Home  DVT ppx: Eliquis    Author: Dwyane Dee, MD 04/14/2021 12:46 PM  For on call review [email or web address].

## 2021-04-14 NOTE — TOC Initial Note (Signed)
Transition of Care [facility]) - Initial/Assessment Note    Patient Details  Name: Deborah [name] MRN: [id number] Date of Birth: 12-18-1965  Transition of Care [facility]) CM/SW Contact:    Leeroy Cha, RN Phone Number: 04/14/2021, 8:01 AM  Clinical Narrative:                  Transition of Care [facility]) Screening Note   Patient Details  Name: Deborah [name] Date of Birth: [birth year]   Transition of Care [facility]) CM/SW Contact:    Leeroy Cha, RN Phone Number: 04/14/2021, 8:01 AM    Transition of Care Department [facility]) has reviewed patient and no TOC needs have been identified at this time. We will continue to monitor patient advancement through interdisciplinary progression rounds. If new patient transition needs arise, please place a TOC consult.    Expected Discharge Plan: Home/Self Care Barriers to Discharge: Continued Medical Work up   Patient Goals and CMS Choice Patient states their goals for this hospitalization and ongoing recovery are:: to return to my home CMS Medicare.gov Compare Post Acute Care list provided to:: Patient    Expected Discharge Plan and Services Expected Discharge Plan: Home/Self Care   Discharge Planning Services: CM Consult   Living arrangements for the past 2 months: Apartment                                      Prior Living Arrangements/Services Living arrangements for the past 2 months: Apartment Lives with:: Spouse Patient language and need for interpreter reviewed:: Yes Do you feel safe going back to the place where you live?: Yes            Criminal Activity/Legal Involvement Pertinent to Current Situation/Hospitalization: No - Comment as needed  Activities of Daily Living Home Assistive Devices/Equipment: Wheelchair ADL Screening (condition at time of admission) Patient's cognitive ability adequate to safely complete daily activities?: Yes Is the patient deaf or have difficulty hearing?: No Does the  patient have difficulty seeing, even when wearing glasses/contacts?: No Does the patient have difficulty concentrating, remembering, or making decisions?: No Patient able to express need for assistance with ADLs?: Yes Does the patient have difficulty dressing or bathing?: Yes Independently performs ADLs?: No Does the patient have difficulty walking or climbing stairs?: Yes Weakness of Legs: Right Weakness of Arms/Hands: Right  Permission Sought/Granted                  Emotional Assessment Appearance:: Appears stated age     Orientation: : Oriented to Self, Oriented to Place, Oriented to  Time, Oriented to Situation Alcohol / Substance Use: Not Applicable Psych Involvement: No (comment)  Admission diagnosis:  Nausea [R11.0] Generalized abdominal pain [R10.84] AKI (acute kidney injury) ([place]) [N17.9] Intractable nausea and vomiting [R11.2] Nausea and vomiting [R11.2] Vomiting, unspecified vomiting type, unspecified whether nausea present [R11.10] Patient Active Problem List   Diagnosis Date Noted   Elevated troponin 11/21/2020   Nausea and vomiting 11/07/2020   Nausea & vomiting 11/06/2020   PAF (paroxysmal atrial fibrillation) (HCC)    Prolonged QT interval    Carcinoid tumor of duodenum    Non-alcoholic cirrhosis (HCC)    Thrombocytosis    Acute renal failure superimposed on stage 3b chronic kidney disease ([place])    ARF (acute renal failure) ([place]) 07/21/2020   Vaginal yeast infection 01/04/2020   AKI (acute kidney injury) ([place]) 11/27/2019  Chronic pain 09/04/2019   Malignant carcinoid tumor of duodenum ([place])    Nonalcoholic fatty liver disease 06/05/2019   Chronic diarrhea    COPD exacerbation ([place]) 05/08/2019   Acute on chronic HFrEF (heart failure with reduced ejection fraction) ([place])    Restrictive lung disease secondary to obesity    History of gastric ulcer    Uncontrolled type 2 diabetes mellitus with hyperglycemia (HCC)    Fibroid uterus 02/23/2019    Chronic systolic CHF (congestive heart failure) ([place]) 12/19/2018   Intractable nausea and vomiting 12/17/2018   Hypoxia 12/17/2018   Abdominal pain 07/17/2018   Cardiac arrest ([place]) 11/29/2017   Leukocytosis 11/29/2017   Anxiety 11/29/2017   Stroke (cerebrum) ([place]) 03/19/2017   GERD (gastroesophageal reflux disease) 03/19/2017   Depression 03/19/2017   Morbid obesity ([place])    Normocytic anemia 08/16/2016   Diabetic gastroparesis ([place]) 08/16/2016   Gout 06/05/2016   Chest pain 09/26/2015   Hypokalemia 09/26/2015   Hypomagnesemia 09/26/2015   Uncontrolled type 2 diabetes mellitus with diabetic neuropathy, with long-term current use of insulin 05/25/2015   Type 2 diabetes mellitus with hyperglycemia, with long-term current use of insulin ([place]) 05/25/2015   Chronic kidney disease, stage 3b ([place]) 05/25/2015   Essential hypertension, benign 09/28/2013   PCP:  Cena Benton, MD Pharmacy:   [facility] [place], [place] - [address] [address] Phone: [phone] Fax: [phone]     Social Determinants of Health (SDOH) Interventions    Readmission Risk Interventions Readmission Risk Prevention Plan 07/02/2019 06/25/2019 01/29/2019  Transportation Screening Complete Complete Complete  PCP or Specialist Appt within 3-5 Days - - Complete  Not Complete comments - - -  [place] or [place] - - Complete  Social Work Consult for [place] Planning/Counseling - - Complete  Palliative Care Screening - - Not Applicable  Medication Review Press photographer) Complete Complete Complete  PCP or Specialist appointment within 3-5 days of discharge - Complete -  [place] or Home Care Consult Complete - -  SW Recovery Care/Counseling Consult Complete Complete -  Palliative Care Screening Not Applicable Not Applicable -  [place] Not Applicable Not Applicable -  Some recent data might be hidden

## 2021-04-14 NOTE — Plan of Care (Signed)
°  Problem: Education: Goal: Knowledge of General Education information will improve Description: Including pain rating scale, medication(s)/side effects and non-pharmacologic comfort measures Outcome: Progressing   Problem: Clinical Measurements: Goal: Will remain free from infection Outcome: Progressing Goal: Respiratory complications will improve Outcome: Progressing Goal: Cardiovascular complication will be avoided Outcome: Progressing   Problem: Nutrition: Goal: Adequate nutrition will be maintained Outcome: Progressing   Problem: Coping: Goal: Level of anxiety will decrease Outcome: Progressing   Problem: Elimination: Goal: Will not experience complications related to urinary retention Outcome: Progressing   Problem: Pain Managment: Goal: General experience of comfort will improve Outcome: Progressing   Problem: Safety: Goal: Ability to remain free from injury will improve Outcome: Progressing   Problem: Skin Integrity: Goal: Risk for impaired skin integrity will decrease Outcome: Progressing

## 2021-04-14 NOTE — Progress Notes (Signed)
Pt refused meds this morning.  After Dr Sabino Gasser rounded he ordered a suppository and an enema which she also refused.  Pt diet changed to clear liquid.  We gave her a cranberry juice and she feels that is moving her bowels, although no solid BM yet.

## 2021-04-15 LAB — GLUCOSE, CAPILLARY
Glucose-Capillary: 195 mg/dL — ABNORMAL HIGH (ref 70–99)
Glucose-Capillary: 223 mg/dL — ABNORMAL HIGH (ref 70–99)
Glucose-Capillary: 183 mg/dL — ABNORMAL HIGH (ref 70–99)
Glucose-Capillary: 166 mg/dL — ABNORMAL HIGH (ref 70–99)

## 2021-04-15 MED ORDER — HYDROMORPHONE HCL 2 MG PO TABS
2.0000 mg | ORAL_TABLET | Freq: Two times a day (BID) | ORAL | Status: DC | PRN
  Administered 2021-04-17 – 2021-04-20 (×6): 2 mg via ORAL
  Filled 2021-04-15 (×8): qty 1

## 2021-04-15 MED ORDER — MAGNESIUM SULFATE 2 GM/50ML IV SOLN
2.0000 g | Freq: Once | INTRAVENOUS | Status: AC
  Administered 2021-04-15: 2 g via INTRAVENOUS
  Filled 2021-04-15 (×2): qty 50

## 2021-04-15 MED ORDER — PANTOPRAZOLE SODIUM 40 MG IV SOLR
40.0000 mg | Freq: Two times a day (BID) | INTRAVENOUS | Status: DC
  Administered 2021-04-15 – 2021-04-20 (×10): 40 mg via INTRAVENOUS
  Filled 2021-04-15 (×10): qty 10

## 2021-04-15 MED ORDER — HYDROMORPHONE HCL 1 MG/ML IJ SOLN
0.5000 mg | Freq: Once | INTRAMUSCULAR | Status: AC
  Administered 2021-04-15: 0.5 mg via INTRAVENOUS
  Filled 2021-04-15: qty 0.5

## 2021-04-15 MED ORDER — ALUM & MAG HYDROXIDE-SIMETH 200-200-20 MG/5ML PO SUSP
30.0000 mL | Freq: Once | ORAL | Status: DC
  Filled 2021-04-15: qty 30

## 2021-04-15 NOTE — Progress Notes (Signed)
Progress Note   Patient: Deborah [name] [id number]:[id number] DOB: 1965-12-27 DOA: 04/13/2021     2 DOS: the patient was seen and examined on 04/15/2021   Brief hospital course: Deborah [name] is a 56 y.o. female with medical history significant of chronic pain, DM2, DM gastroparesis, HFrEF, A fib on eliquis, CKD3b. Presenting with N/V, abdominal pain. N/V started day prior to admission. Tried zofran, but it didn't help. She didn't notice any hematemesis. Stomach pain started morning of admission Due to her pain she presented to the ER.   Of note, recent pyloromyotomy procedure on 03/27/21 in [place] for DM gastroparesis.Patient underwent upper GI series on 03/28/2021 which demonstrated no leak. She was then transitioned to clears which she tolerated well.   With further information from patient on 2/7, she states she reportedly was in the hospital in [facility] for approximately 3 weeks being treated with fentanyl and other opioids.  When asked about her last bowel movements, she is unable to adequately answer and constantly changing her answer.  Possibly even over a week prior to admission.  She also has answers that are almost tangential when being asked the question and she is constantly having to be redirected to the initial question.  She seems to continue to focus on "leg pain" but then changes her answers to having abdominal pain.   Assessment and Plan:  Abdominal pain N/V DM gastroparesis Constipation  - CT shows what appears to be stool burden throughout; her answer changes about last bowel movement but I am inclined to think it has been several days especially with recent prolonged hospitalization in [place] where she was given prolonged course of opioids as she reports -Database reviewed and she also is receiving Dilaudid 4 mg tablets (last filled 02/20/2021, quantity 60 for 30-day supply).  Her fill history is very consistent almost monthly. - per careeverywhere so does show  that she was hospitalized in [place] from 1/13 - 1/27 -My suspicion is still underlying constipation contributing to her symptoms notably the pain/distention as well as her nausea/vomiting and lack of bowel movements. -I strongly encouraged her for considering a course of laxatives including suppository and enema which she was amenable with however when nurse came around with medications, she refused -Patient does demonstrate some subtle pain seeking tendencies notably wanting Dilaudid and changing her answer where her pain is -Holding off on pain medication at this time as she does not appear to be in any overt distress during my exam and would also worsen underlying constipation; I do perceive an underlying psych component to some of this as well - restart on CLD and advance as tolearted - continue to offer suppository and/or enema; will also order some oral laxatives PRN in case she changes her mind -Patient has been refusing to answer questions and refusing p.o. Dilaudid.  Start PPI.  Give one-time dose of Dilaudid low-dose.  Suspect all drug-seeking behavior. -All imaging has been negative thus far.  Checking acute abdominal series.  Finally had a bowel movement.  No active vomiting.   DM2 - A1c 7.5% on 04/13/21 -Continue SSI and CBG monitoring -Semglee ordered at 35 units daily; home dose is Lantus 50 units daily.  Will further decrease Semglee down to 20 units and can be further modified as needed   AKI on CKD3b - fluids but she did not want them to continue due to "swelling" - CT negative for renal obstruction   Hypokalemia -Replete as needed   Normocytic anemia - no evidence  of bleed, check iron studies and folate/B12   Hx of CVA - recent CVA w/ right side residuals; epic reviewed, has negative CTH on 03/23/21, but no MRI for follow up due to body habitus (was seen by neurology) - can have PT/OT eval  Chronic systolic HF -Initially treated with fluids due to worsened renal  function which did improve; patient wished for fluids to be stopped due to worsening swelling     - holding diuretic for today d/t AKI   HTN - resume home regimen minus nephrotoxins   Afib - continue eliquis, metoprolol    Subjective:  patient refusing to speak refusing medications no vomiting per report  Physical Exam: Vitals:   04/14/21 2052 04/15/21 0509 04/15/21 0735 04/15/21 1325  BP: 115/73 130/70  (!) 124/106  Pulse: 87 95  100  Resp: 18 17  20   Temp: 98.2 F (36.8 C) 98.7 F (37.1 C)  99.1 F (37.3 C)  TempSrc:    Oral  SpO2: 100% 100%  100%  Weight:      Height:   5\' 6"  (1.676 m)   Physical Exam Constitutional:      General: She is not in acute distress.    Appearance: Normal appearance. She is obese. She is not ill-appearing.  HENT:     Head: Normocephalic and atraumatic.     Mouth/Throat:     Mouth: Mucous membranes are moist.  Eyes:     Extraocular Movements: Extraocular movements intact.  Cardiovascular:     Rate and Rhythm: Normal rate and regular rhythm.  Pulmonary:     Effort: Pulmonary effort is normal.     Breath sounds: Normal breath sounds.  Abdominal:     Comments: Obese, soft, BS hypoactive. Not tender when pushing with stethoscope during auscultation but subjectively tender to percussion. No R/G  Musculoskeletal:        General: Swelling present. Normal range of motion.     Cervical back: Normal range of motion and neck supple.     Comments: 1-2+ LE edema  Skin:    General: Skin is warm and dry.  Neurological:     General: No focal deficit present.     Mental Status: She is alert.  Psychiatric:     Comments: Odd affect. Tangential thought process     Author: Phillips Grout, MD 04/15/2021 2:16 PM

## 2021-04-15 NOTE — Progress Notes (Incomplete)
Attempted to get patient out of chair

## 2021-04-15 NOTE — Evaluation (Signed)
Occupational Therapy Evaluation Patient Details Name: Deborah [name] MRN: [id number] DOB: 1965-04-30 Today's Date: 04/15/2021   History of Present Illness Deborah [name] is a 56 y.o. female presents with nausea and vomiting and abdominal discomfort. CT abdomen pelvis without evidence of significant acute pathology. Of note, pyloromyotomy procedure on 03/27/21 in [place] for DM gastroparesis. PMH: afib, diabetes, CHF, CVA, CKD, fibromyalgia, HTN   Clinical Impression   Deborah [name] is a 56 year old woman who presents with above medical history. From chart review patient is mostly wc bound and mobility limited to transfers and has assistance from family and aide for all ADLs if needed. Patient is known to therapist and has declined OT services in the past stating "I don't need that." On today's evaluation patient predominantly crying and restless - does not meet therapist eyes, does not answer questions, does not follow commands and does not significantly assist with self care task after being found incontinent of BM. The only verbalizations she will make is "It hurts" and "everywhere." She was found in the chair and observed transferring from chair to bed without assistance. Therapist attempted to get patient on to [facility] in order to clean both bed and chair but patient did not follow instructions and transferred herself back to the bed. She is not willing to stand to be cleaned. Tech reports she has been refusing all care but has performed ADLs without assistance if she wants too. During evaluation she is max-total assist for ADLs secondary to emotional disturbance and assumed impaired cognition. Unsure if pain is the catalyst here or some psychosomatic component. Either way patient is unable to safely take care of herself. Patient will benefit from skilled OT services while in hospital to improve deficits and learn compensatory strategies as needed in order to return to PLOF.  Expect  that once patient's pain/emotional behaviors have been controlled she will be able to perform tasks at her baseline. She will need 24/7 assistance at discharge.      Recommendations for follow up therapy are one component of a multi-disciplinary discharge planning process, led by the attending physician.  Recommendations may be updated based on patient status, additional functional criteria and insurance authorization.   Follow Up Recommendations  Home health OT    Assistance Recommended at Discharge Frequent or constant Supervision/Assistance  Patient can return home with the following A little help with walking and/or transfers;A lot of help with bathing/dressing/bathroom;Assistance with cooking/housework;Direct supervision/assist for medications management;Direct supervision/assist for financial management;Help with stairs or ramp for entrance    Functional Status Assessment  Patient has had a recent decline in their functional status and demonstrates the ability to make significant improvements in function in a reasonable and predictable amount of time.  Equipment Recommendations  BSC/3in1 (bari)    Recommendations for Other Services       Precautions / Restrictions Precautions Precautions: Fall Restrictions Weight Bearing Restrictions: No      Mobility Bed Mobility                    Transfers                          Balance Overall balance assessment: Needs assistance Sitting-balance support: No upper extremity supported, Feet supported Sitting balance-Deborah [name]: Good         Standing balance comment: Patient doesn't come in to full standing - she only transfers  ADL either performed or assessed with clinical judgement   ADL Overall ADL's : Needs assistance/impaired Eating/Feeding: Set up;Sitting Eating/Feeding Details (indicate cue type and reason): per nursing can feed herself if she wants too Grooming:  Maximal assistance;Sitting   Upper Body Bathing: Maximal assistance;Sitting   Lower Body Bathing: Total assistance;Sitting/lateral leans   Upper Body Dressing : Sitting;Maximal assistance   Lower Body Dressing: Total assistance;Sitting/lateral leans   Toilet Transfer: Min Dispensing optician Details (indicate cue type and reason): demonstrates abilty to transfer her self - has been doing so with nursing Toileting- Water quality scientist and Hygiene: Total assistance;Sitting/lateral lean Toileting - Clothing Manipulation Details (indicate cue type and reason): to clean patient up after incontinent BM. She does attempt to clean herself or appear to care.     Functional mobility during ADLs: Min guard General ADL Comments: Min guard to transfer from recliner to bed. has been transferring herself to and from recliner or [facility] - despite requests not too. Patient is crying incontrollably and is unable to participate due emotional behaviors resulting needing max-total assist for ADLs to maintain self care.     Vision   Vision Assessment?: No apparent visual deficits     Perception     Praxis      Pertinent Vitals/Pain Pain Assessment Pain Assessment: PAINAD Breathing: occasional labored breathing, short period of hyperventilation Negative Vocalization: repeated troubled calling out, loud moaning/groaning, crying Facial Expression: sad, frightened, frown Body Language: tense, distressed pacing, fidgeting Consolability: unable to console, distract or reassure PAINAD Score: 7 Pain Location: "everywhere" Pain Descriptors / Indicators: Crying, Restless Pain Intervention(s): Monitored during session     Hand Dominance Right   Extremity/Trunk Assessment Upper Extremity Assessment Upper Extremity Assessment: Difficult to assess due to impaired cognition   Lower Extremity Assessment Lower Extremity Assessment: Difficult to assess due to impaired cognition   Cervical / Trunk  Assessment Cervical / Trunk Assessment: Kyphotic   Communication Communication Communication: No difficulties   Cognition Arousal/Alertness: Awake/alert Behavior During Therapy: Restless Overall Cognitive Status: No family/caregiver present to determine baseline cognitive functioning                                 General Comments: Patient does not answer questions, does not follow commands (except once to lift arms when therapist placed gown on her), her verbalizations are minimal but she states she is in pain in very little words. RN notified. She does not meet therapist eyes - she does not partcipate in self care tasks or appear to be worried about sitting in feces.     General Comments       Exercises     Shoulder Instructions      Home Living Family/patient expects to be discharged to:: Private residence Living Arrangements: Spouse/significant other Available Help at Discharge: Family;Available 24 hours/day Type of Home: Apartment Home Access: Level entry     Home Layout: One level     Bathroom Shower/Tub: Tub/shower unit         Home Equipment: BSC/3in1;Shower seat;Wheelchair - manual   Additional Comments: pt reports BSC is broken and needs a new one. Hx taken from PT eval and prior notes.      Prior Functioning/Environment Prior Level of Function : Needs assist             Mobility Comments: pt reports using w/c at all times, doesn't ambulate at home, reports spouse assists "with whatever I  need" ADLs Comments: pt reports spouse assists "with whatever I need" - prior history states she has an aide for 2.5 hrs daily        OT Problem List: Decreased cognition;Decreased safety awareness;Decreased activity tolerance;Obesity;Pain      OT Treatment/Interventions: Self-care/ADL training;Therapeutic exercise;DME and/or AE instruction;Therapeutic activities;Cognitive remediation/compensation;Patient/family education;Balance training    OT  Goals(Current goals can be found in the care plan section) Acute Rehab OT Goals OT Goal Formulation: Patient unable to participate in goal setting Time For Goal Achievement: 04/29/21 Potential to Achieve Goals: Fair  OT Frequency: Min 2X/week    Co-evaluation              AM-PAC OT "6 Clicks" Daily Activity     Outcome Measure Help from another person eating meals?: A Little Help from another person taking care of personal grooming?: A Lot Help from another person toileting, which includes using toliet, bedpan, or urinal?: Total Help from another person bathing (including washing, rinsing, drying)?: A Lot Help from another person to put on and taking off regular upper body clothing?: A Lot Help from another person to put on and taking off regular lower body clothing?: Total 6 Click Score: 11   End of Session Nurse Communication: Mobility status  Activity Tolerance: Treatment limited secondary to agitation;Patient limited by pain Patient left: in bed (at side of bed with tech)  OT Visit Diagnosis: Pain                Time: 3212-2482 OT Time Calculation (min): 19 min Charges:  OT General Charges $OT Visit: 1 Visit OT Evaluation $OT Eval Low Complexity: 1 Low  Riyaan Heroux, OTR/L [place]  Office [phone] Pager: [place] 04/15/2021, 12:28 PM

## 2021-04-15 NOTE — Progress Notes (Signed)
Pt had large incontinent BM on chair and bed. Continues to complain of pain and writhing in chair. Dr. Shanon Brow made aware.

## 2021-04-15 NOTE — Progress Notes (Signed)
RN went into room to give scheduled medications as well as offer pt PO dilaudid and IV Compazine. Pt continues to make eye contact when her name is called or asked a question but continues to refuse to answer questions or follow commands. Continues to refuse telemetry. VSS. CBG 223. Mother at bedside during this time. When hearing mother's voice, pt begins to cry hysterically. Inconsolable. Attempted to get patient out of chair back to bed at mother's request. Pt refusing to stand with staff assistance. Pt yelling "mommy no" over and over. Mother is understandably very concerned about her daughter. RN tried to reassure mother that she would contact MD. Pt is currently in chair with chair alarm on. Call bell within reach. Dr. Shanon Brow paged and made aware.

## 2021-04-15 NOTE — Evaluation (Addendum)
Physical Therapy Evaluation Patient Details Name: Deborah [name] MRN: [id number] DOB: [birth year] Today's Date: 04/15/2021  History of Present Illness  Terrea KIRSTY MONJARAZ is a 56 y.o. female presents with nausea and vomiting and abdominal discomfort. CT abdomen pelvis without evidence of significant acute pathology. Of note, pyloromyotomy procedure on 03/27/21 in [place] for DM gastroparesis. PMH: afib, diabetes, CHF, CVA, CKD, fibromyalgia, HTN   Clinical Impression  Pt admitted with above diagnosis. Pt evaluated yesterday, conversational with therapist and able to follow commands and verbalize needs, noted to be off in year and age by 10 years. Today, pt noted to have change in mental status and PT re-ordered. Upon entry, pt's mom at bedside, states this is not patient's baseline, states pt normally transfers to w/c and completes tasks with assist from spouse and aides as needed. Pt's mom states she sees pt every other day and this is not normal; mom also states "something must be worrying her". Pt with occasional crying, writhing in recliner in all directions maintains head and eye gaze down towards floor, doesn't make eye contact with therapist or acknowledge therapist, pt's mom lifts pt's head to look at her and verbalizes concerns but pt doesn't make eye contact or verbalize complaints to mom. Pt doesn't follow any commands or prompts to mobilize from therapist or mom. Again, this is a significant change in cognitive status from yesterday; yesterday pt able to verbalize why she didn't want PT, pain in her legs from fluid and conversational with therapist; today pt not engaging with conversations to therapist or mom and not following any commands. Pt's mom appears very concerned regarding pt's difference in cognition from baseline; RN notified of concerns. Pt seated firmly in recliner with chair alarm on, provided mom with call bell and encouraged her to call if any needs arise. Pt will benefit  from acute PT with recommendation for HHPT pending pt's ability to participate with therapy; based on current presentation, recommend 24/7 assist at home. Pt currently with functional limitations due to the deficits listed below (see PT Problem List). Pt will benefit from skilled PT to increase their independence and safety with mobility to allow discharge to the venue listed below.          Recommendations for follow up therapy are one component of a multi-disciplinary discharge planning process, led by the attending physician.  Recommendations may be updated based on patient status, additional functional criteria and insurance authorization.  Follow Up Recommendations Home health PT (pending ability to participate)    Assistance Recommended at Discharge Frequent or constant Supervision/Assistance  Patient can return home with the following  A little help with walking and/or transfers;A little help with bathing/dressing/bathroom;Assistance with cooking/housework;Assist for transportation    Equipment Recommendations BSC/3in1  Recommendations for Other Services       Functional Status Assessment Patient has had a recent decline in their functional status and demonstrates the ability to make significant improvements in function in a reasonable and predictable amount of time.     Precautions / Restrictions Precautions Precautions: Fall Restrictions Weight Bearing Restrictions: No      Mobility  Bed Mobility   Transfers  General transfer comment: pt sitting in recliner, unable to cue for any mobility, pt's mom at bedside encouraging pt to get back into bed but pt doesn't appear to acknowledge mom's concerns    Ambulation/Gait   Stairs            Wheelchair Mobility    Modified Rankin (Stroke Patients  Only)       Balance          Pertinent Vitals/Pain Pain Assessment Pain Assessment: PAINAD Breathing: occasional labored breathing, short period of  hyperventilation Negative Vocalization: occasional moan/groan, low speech, negative/disapproving quality Facial Expression: sad, frightened, frown Body Language: tense, distressed pacing, fidgeting Consolability: unable to console, distract or reassure PAINAD Score: 6 Pain Location: none stated, pt writhing while seated in recliner Pain Descriptors / Indicators: Crying Pain Intervention(s): Monitored during session    Home Living Family/patient expects to be discharged to:: Private residence Living Arrangements: Spouse/significant other Available Help at Discharge: Family;Available 24 hours/day Type of Home: Apartment Home Access: Level entry       Home Layout: One level Home Equipment: BSC/3in1;Shower seat;Wheelchair - manual Additional Comments: Pt's mom in room this date, reports pt's spouse and aide assist her with self care and mobility as needed, pt is normally conversational, transfers to w/c and completes tasks with assist as needed    Prior Function Prior Level of Function : Needs assist  Mobility Comments: pt reports using w/c at all times, doesn't ambulate at home, reports spouse assists "with whatever I need" ADLs Comments: pt reports spouse assists "with whatever I need"     Hand Dominance   Dominant Hand: Right    Extremity/Trunk Assessment   Upper Extremity Assessment Upper Extremity Assessment: Difficult to assess due to impaired cognition    Lower Extremity Assessment Lower Extremity Assessment: Difficult to assess due to impaired cognition RLE Deficits / Details: change in cognition from eval yesterday, unable to assess LLE Deficits / Details: change in cognition from eval yesterday, unable to assess    Cervical / Trunk Assessment Cervical / Trunk Assessment: Kyphotic (kyphotic posture while seated in recliner)  Communication   Communication:  (pt not responding to questions from mom or therapist)  Cognition Arousal/Alertness: Lethargic Behavior  During Therapy: Restless Overall Cognitive Status: Impaired/Different from baseline  General Comments: Pt's mom at bedside reports "something must be worrying her" and "this is not normal". Pt with moments of crying but unable to verbalize why. Pt doesn't follow any commands, doesn't verbalize any needs, doesn't make eye contact with therapist, pt's mom lifts her head towards her and pt doesn't make eye contact with mom. Pt sitting in recliner writhing in all directions with head postured down and eyes closed, does lift head at times but maintains eyes closed.        General Comments      Exercises     Assessment/Plan    PT Assessment Patient needs continued PT services  PT Problem List Decreased strength;Decreased activity tolerance;Decreased balance;Decreased mobility;Decreased cognition;Decreased knowledge of use of DME;Obesity;Pain       PT Treatment Interventions DME instruction;Gait training;Functional mobility training;Therapeutic activities;Therapeutic exercise;Balance training;Cognitive remediation;Patient/family education    PT Goals (Current goals can be found in the Care Plan section)  Acute Rehab PT Goals Patient Stated Goal: mom states "she needs to tell us what is wrong so we can help her" PT Goal Formulation: Patient unable to participate in goal setting Time For Goal Achievement: 04/29/21 Potential to Achieve Goals: Fair    Frequency Min 2X/week     Co-evaluation               AM-PAC PT "6 Clicks" Mobility  Outcome Measure Help needed turning from your back to your side while in a flat bed without using bedrails?: Total Help needed moving from lying on your back to sitting on the side of  a flat bed without using bedrails?: Total Help needed moving to and from a bed to a chair (including a wheelchair)?: Total Help needed standing up from a chair using your arms (e.g., wheelchair or bedside chair)?: Total Help needed to walk in hospital room?: Total Help  needed climbing 3-5 steps with a railing? : Total 6 Click Score: 6    End of Session   Activity Tolerance: Other (comment) (limited by cognition) Patient left: in chair;with chair alarm set;with family/visitor present;with call bell/phone within reach Nurse Communication: Mobility status;Other (comment) (cognition) PT Visit Diagnosis: Muscle weakness (generalized) (M62.81);Pain    Time: 1357-1405 PT Time Calculation (min) (ACUTE ONLY): 8 min   Charges:   PT Evaluation $PT Re-evaluation: 1 Re-eval           Tori Lichelle Viets PT, DPT 04/15/21, 2:24 PM

## 2021-04-15 NOTE — Progress Notes (Signed)
Upon morning assessment, pt will make eye contact with RN when her name is called or asked a question however will not interact or speak with RN. MD at bedside and will not speak to MD when asked questions or assessed. NT states pt did not speak to her when she went in to obtain CBG but did allow fingerstick. Phlebotomist in to draw AM labs, pt refused stating "not this morning". IV RN at bedside to place new IV. Pt continues to not speak when spoken to. Allowed IV RN to use ultrasound to place IV with bedside RN assistance. Again, only speaks when asked if she is in any pain,  states "yes", but will not specify where when asked. Pt refuses to answer RN when asked if she will take AM meds or wants something to eat/drink. Dr. Shanon Brow paged and made aware.

## 2021-04-16 ENCOUNTER — Inpatient Hospital Stay (HOSPITAL_COMMUNITY): Payer: Medicare Other

## 2021-04-16 LAB — CBC WITH DIFFERENTIAL/PLATELET
Abs Immature Granulocytes: 0.07 10*3/uL (ref 0.00–0.07)
Basophils Absolute: 0 10*3/uL (ref 0.0–0.1)
Basophils Relative: 0 %
Eosinophils Absolute: 0.1 10*3/uL (ref 0.0–0.5)
Eosinophils Relative: 1 %
HCT: 23.6 % — ABNORMAL LOW (ref 36.0–46.0)
Hemoglobin: 7.1 g/dL — ABNORMAL LOW (ref 12.0–15.0)
Immature Granulocytes: 1 %
Lymphocytes Relative: 14 %
Lymphs Abs: 1.7 10*3/uL (ref 0.7–4.0)
MCH: 28.2 pg (ref 26.0–34.0)
MCHC: 30.1 g/dL (ref 30.0–36.0)
MCV: 93.7 fL (ref 80.0–100.0)
Monocytes Absolute: 0.9 10*3/uL (ref 0.1–1.0)
Monocytes Relative: 7 %
Neutro Abs: 9.8 10*3/uL — ABNORMAL HIGH (ref 1.7–7.7)
Neutrophils Relative %: 77 %
Platelets: 361 10*3/uL (ref 150–400)
RBC: 2.52 MIL/uL — ABNORMAL LOW (ref 3.87–5.11)
RDW: 16.1 % — ABNORMAL HIGH (ref 11.5–15.5)
WBC: 12.6 10*3/uL — ABNORMAL HIGH (ref 4.0–10.5)
nRBC: 0 % (ref 0.0–0.2)

## 2021-04-16 LAB — LACTIC ACID, PLASMA
Lactic Acid, Venous: 1 mmol/L (ref 0.5–1.9)
Lactic Acid, Venous: 1.1 mmol/L (ref 0.5–1.9)

## 2021-04-16 LAB — URINALYSIS, ROUTINE W REFLEX MICROSCOPIC
Bilirubin Urine: NEGATIVE
Glucose, UA: NEGATIVE mg/dL
Hgb urine dipstick: NEGATIVE
Ketones, ur: NEGATIVE mg/dL
Leukocytes,Ua: NEGATIVE
Nitrite: NEGATIVE
Protein, ur: 100 mg/dL — AB
Specific Gravity, Urine: 1.012 (ref 1.005–1.030)
pH: 5 (ref 5.0–8.0)

## 2021-04-16 LAB — BASIC METABOLIC PANEL
Anion gap: 9 (ref 5–15)
BUN: 51 mg/dL — ABNORMAL HIGH (ref 6–20)
CO2: 16 mmol/L — ABNORMAL LOW (ref 22–32)
Calcium: 9.2 mg/dL (ref 8.9–10.3)
Chloride: 112 mmol/L — ABNORMAL HIGH (ref 98–111)
Creatinine, Ser: 2.25 mg/dL — ABNORMAL HIGH (ref 0.44–1.00)
GFR, Estimated: 25 mL/min — ABNORMAL LOW (ref >60)
Glucose, Bld: 157 mg/dL — ABNORMAL HIGH (ref 70–99)
Potassium: 3.7 mmol/L (ref 3.5–5.1)
Sodium: 137 mmol/L (ref 135–145)

## 2021-04-16 LAB — MAGNESIUM: Magnesium: 2.3 mg/dL (ref 1.7–2.4)

## 2021-04-16 LAB — GLUCOSE, CAPILLARY
Glucose-Capillary: 155 mg/dL — ABNORMAL HIGH (ref 70–99)
Glucose-Capillary: 119 mg/dL — ABNORMAL HIGH (ref 70–99)
Glucose-Capillary: 157 mg/dL — ABNORMAL HIGH (ref 70–99)
Glucose-Capillary: 104 mg/dL — ABNORMAL HIGH (ref 70–99)

## 2021-04-16 LAB — AMMONIA: Ammonia: <10 umol/L (ref 9–35)

## 2021-04-16 LAB — CK: Total CK: 97 U/L (ref 38–234)

## 2021-04-16 LAB — TSH: TSH: 1.384 u[IU]/mL (ref 0.350–4.500)

## 2021-04-16 MED ORDER — HYDROMORPHONE HCL 1 MG/ML IJ SOLN
0.2500 mg | Freq: Three times a day (TID) | INTRAMUSCULAR | Status: DC | PRN
  Administered 2021-04-16: 0.25 mg via INTRAVENOUS
  Filled 2021-04-16: qty 0.5

## 2021-04-16 MED ORDER — HYDROMORPHONE HCL 1 MG/ML IJ SOLN
1.0000 mg | Freq: Three times a day (TID) | INTRAMUSCULAR | Status: DC | PRN
Start: 2021-04-16 — End: 2021-04-21
  Administered 2021-04-16 – 2021-04-20 (×7): 1 mg via INTRAVENOUS
  Filled 2021-04-16 (×7): qty 1

## 2021-04-16 MED ORDER — LORAZEPAM 2 MG/ML IJ SOLN
0.2500 mg | Freq: Once | INTRAMUSCULAR | Status: AC
  Administered 2021-04-16: 0.25 mg via INTRAVENOUS
  Filled 2021-04-16: qty 1

## 2021-04-16 MED ORDER — THIAMINE HCL 100 MG/ML IJ SOLN
400.0000 mg | Freq: Every day | INTRAMUSCULAR | Status: AC
  Administered 2021-04-16 – 2021-04-18 (×3): 400 mg via INTRAVENOUS
  Filled 2021-04-16 (×3): qty 4

## 2021-04-16 MED ORDER — LORAZEPAM 2 MG/ML IJ SOLN
0.5000 mg | INTRAMUSCULAR | Status: DC | PRN
  Administered 2021-04-16 – 2021-04-20 (×9): 0.5 mg via INTRAVENOUS
  Filled 2021-04-16 (×10): qty 1

## 2021-04-16 MED ORDER — HYDROMORPHONE HCL 1 MG/ML IJ SOLN: 0.5000 mg | Freq: Three times a day (TID) | INTRAMUSCULAR | Status: DC | PRN

## 2021-04-16 MED ORDER — HYDROMORPHONE HCL 1 MG/ML IJ SOLN
0.5000 mg | Freq: Once | INTRAMUSCULAR | Status: AC
  Administered 2021-04-16: 0.5 mg via INTRAVENOUS
  Filled 2021-04-16: qty 0.5

## 2021-04-16 MED ORDER — ACETAMINOPHEN 650 MG RE SUPP: 650.0000 mg | RECTAL | Status: DC | PRN

## 2021-04-16 MED ORDER — LABETALOL HCL 5 MG/ML IV SOLN: 5.0000 mg | INTRAVENOUS | Status: DC | PRN

## 2021-04-16 NOTE — Progress Notes (Signed)
Patient refused all po meds, refusing cardiac monitor. Patient yells out when staff in room but calm upon staff exit. Patient max assistance to get back to bed at shift change. Clarene Essex, NP notified of refusals.

## 2021-04-16 NOTE — Progress Notes (Addendum)
Patient lying with eyes half open when RN entered room this morning.  Began crying, grimacing when RN asked her, "How are you doing?"  Patient not replying to RN's questions, continues to cry.  Received one-time dose IV Dilaudid 0.5 mg at 0628.  MD aware.  Will continue to monitor.  Angie Fava, RN

## 2021-04-16 NOTE — Consult Note (Signed)
°  Patient seen and attempted to assess, however multiple attempts were unsuccessful.  Patient is observed to be very restless witContinuous vocalizations such as groaning, moaning, grunting, and high shrills.   Chart review shows patient has had approximately 8 admissions in the past 6 months, with multiple emergency room visits in between for intractable vomiting, nausea, atrial fibrillation, generalized abdominal pain, diabetic gastroparesis, cancer related pain, malignant carcinoid tumor of the duodenum.  Chart review also shows patient had stroke 2 weeks ago on the right side, with minimal residual although there does not appear to be any documentation in Care Everywhere for CVA in January 2023.  Last known CVA noted was October 2021, recent MI in 2022.  Patient does seem to present with new neurological deficits, that warrants further imaging and likely neurological consult. Patient has presented different from her baseline 3 days ago.  There does appear to be concern regarding drug-seeking behaviors, however patient is receiving appropriate medication regimen and still will need further work-up for new neurological deficits.  Based off physical therapies assessment yesterday patient did have a change in mental status, intermittent crying, eye gaze downward towards the floor, does not make eye contact.  Also mother seemed to be concerned about change in cognitive status.   -Recommend neurological consult.  Recommend additional imaging and studies to determine acute change for altered mental status. -Will order additional labs to include ammonia, TSH, CK. -We will also order IV thiamine 400 3 times daily x3 days, will follow up with oral thiamine for thiamine deficiency related to gastrointestinal issues. -Psychiatry will continue to follow.

## 2021-04-16 NOTE — Progress Notes (Signed)
Patient's behavior inappropriate for shift. Patient yells/screams out when staff enter her room or attempts to provide care. Struck at NT twice. Patient redirected. Patient incontinent of bowel and bladder. Patient will not answer questions or follow simple commands but looks at and tracks staff with eyes.  Besides yelling out patient is non-interactive. Patient refused PO meds. One time order received for Dilaudid to assist with pain management.  Due to patient's high violence risk staff assisting patient with a buddy.

## 2021-04-16 NOTE — Progress Notes (Signed)
When trying to clean up the patient, patient was actively refusing to listen to staff and shouting "No" whenever staff tried to touch her or redirect her with movement for cleaning her up. Patient required additional staff members to be present in the room, in addition to the nurse and aid already assigned to her, in order to get the patient cleaned up. The whole time, the patient was actively screaming at the top of her lungs and trying to move the staff off of her so they would not be touching her. MD notified.

## 2021-04-16 NOTE — Progress Notes (Signed)
Patient inconsolable, crying.  Refusing PO pain medication, fluids.  MD aware.  Angie Fava, RN

## 2021-04-16 NOTE — TOC Progression Note (Addendum)
Transition of Care [facility]) - Progression Note    Patient Details  Name: Deborah [name] MRN: [id number] Date of Birth: 1965/08/24  Transition of Care [facility]) CM/SW Contact  Purcell Mouton, RN Phone Number: 04/16/2021, 10:08 AM  Clinical Narrative:    TOC following pt, for discharge needs.    Expected Discharge Plan: Home/Self Care Barriers to Discharge: Continued Medical Work up  Expected Discharge Plan and Services Expected Discharge Plan: Home/Self Care   Discharge Planning Services: CM Consult   Living arrangements for the past 2 months: Apartment                                       Social Determinants of Health (SDOH) Interventions    Readmission Risk Interventions Readmission Risk Prevention Plan 07/02/2019 06/25/2019 01/29/2019  Transportation Screening Complete Complete Complete  PCP or Specialist Appt within 3-5 Days - - Complete  Not Complete comments - - -  HRI or [place] - - Complete  Social Work Consult for [place] Planning/Counseling - - Complete  Palliative Care Screening - - Not Applicable  Medication Review Press photographer) Complete Complete Complete  PCP or Specialist appointment within 3-5 days of discharge - Complete -  Mobile or Home Care Consult Complete - -  SW Recovery Care/Counseling Consult Complete Complete -  Palliative Care Screening Not Applicable Not Applicable -  [employer] Not Applicable Not Applicable -  Some recent data might be hidden

## 2021-04-16 NOTE — Progress Notes (Addendum)
Progress Note   Patient: Deborah [name] ZOX:[id number] DOB: June 02, 1965 DOA: 04/13/2021     3 DOS: the patient was seen and examined on 04/16/2021   Brief hospital course: Deborah [name] is a 56 y.o. female with medical history significant of chronic pain, DM2, DM gastroparesis, HFrEF, A fib on eliquis, CKD3b. Presenting with N/V, abdominal pain. N/V started day prior to admission. Tried zofran, but it didn't help. She didn't notice any hematemesis. Stomach pain started morning of admission Due to her pain she presented to the ER.   Of note, recent pyloromyotomy procedure on 03/27/21 in [place] for DM gastroparesis.Patient underwent upper GI series on 03/28/2021 which demonstrated no leak. She was then transitioned to clears which she tolerated well.   With further information from patient on 2/7, she states she reportedly was in the hospital in [facility] for approximately 3 weeks being treated with fentanyl and other opioids.  When asked about her last bowel movements, she is unable to adequately answer and constantly changing her answer.  Possibly even over a week prior to admission.  She also has answers that are almost tangential when being asked the question and she is constantly having to be redirected to the initial question.  She seems to continue to focus on "leg pain" but then changes her answers to having abdominal pain.  Subjective Patient continues to refuse medications and imaging.  She has had several bowel movements however over the last several days due to increasing constipation regimen.  She has occasional bursts of screaming and shouting demanding IV Dilaudid.  Will not take her p.o. Dilaudid.  Not vomiting.  No focal neurological deficits.  No seizure activity.  Assessment and Plan:  Abdominal pain N/V DM gastroparesis Constipation  - CT shows what appears to be stool burden throughout; her answer changes about last bowel movement but I am inclined to think it has  been several days especially with recent prolonged hospitalization in [place] where she was given prolonged course of opioids as she reports -Database reviewed and she also is receiving Dilaudid 4 mg tablets (last filled 02/20/2021, quantity 60 for 30-day supply).  Her fill history is very consistent almost monthly. - per careeverywhere so does show that she was hospitalized in [place] from 1/13 - 1/27 -My suspicion is still underlying constipation contributing to her symptoms notably the pain/distention as well as her nausea/vomiting and lack of bowel movements.Has had successful bowel movements over the last 24 hoursHas had excess full bowel movements in the last 24 hours -Patient does demonstrate some subtle pain seeking tendencies notably wanting Dilaudid and changing her answer where her pain is -Holding off on pain medication at this time as she does not appear to be in any overt distress during my exam and would also worsen underlying constipation; I do perceive an underlying psych component to some of this as well - restart on CLD and advance as tolearted -Patient has been refusing to answer questions and refusing p.o. Dilaudid.  Start PPI.  Give one-time dose of Dilaudid low-dose.  Suspect all drug-seeking behavior. -All imaging has been negative thus far.  Repeat acute abdominal series neg.  Finally had a bowel movement.  No active vomiting.   DM2 - A1c 7.5% on 04/13/21 -Continue SSI and CBG monitoring -Semglee ordered at 35 units daily; home dose is Lantus 50 units daily.  Will further decrease Semglee down to 20 units and can be further modified as needed   AKI on CKD3b - fluids but  she did not want them to continue due to "swelling" - CT negative for renal obstruction   Hypokalemia -Replete as needed   Normocytic anemia - no evidence of bleed, check iron studies and folate/B12   Hx of CVA - recent CVA w/ right side residuals; epic reviewed, has negative CTH on 03/23/21, but  no MRI for follow up due to body habitus (was seen by neurology) - can have PT/OT eval  Chronic systolic HF -Initially treated with fluids due to worsened renal function which did improve; patient wished for fluids to be stopped due to worsening swelling     - holding diuretic for today d/t AKI   HTN - resume home regimen minus nephrotoxins   Afib - continue eliquis, metoprolol  Obtain psychiatric consultation.  Requesting CT of the head although no focal neurological deficits and EEG.  Patient has no evidence of seizure activity will defer EEG to psychiatric team.  Await final recommendations.  Query whether or not she has the capacity and capability of making decisions.  Very difficult case to ascertain with patient refusal of many modalities of treatment.   Physical Exam: Vitals:   04/15/21 0735 04/15/21 1325 04/15/21 2045 04/16/21 0500  BP:  (!) 124/106 110/65   Pulse:  100 99   Resp:  20 20   Temp:  99.1 F (37.3 C) 99.2 F (37.3 C)   TempSrc:  Oral Oral   SpO2:  100% 100%   Weight:    122.9 kg  Height: 5\' 6"  (1.676 m)     Physical Exam Constitutional:      General: She is not in acute distress.    Appearance: Normal appearance. She is obese. She is not ill-appearing.     Comments: She is awake and looks at me scratches at her face and cries and moans during interview will not answer questions moving all extremities  HENT:     Head: Normocephalic and atraumatic.     Mouth/Throat:     Mouth: Mucous membranes are moist.  Eyes:     Extraocular Movements: Extraocular movements intact.  Cardiovascular:     Rate and Rhythm: Normal rate and regular rhythm.  Pulmonary:     Effort: Pulmonary effort is normal.     Breath sounds: Normal breath sounds.  Abdominal:     Comments: Obese, soft, BS hypoactive. Not tender when pushing with stethoscope during auscultation but subjectively tender to percussion. No R/G  Musculoskeletal:        General: Swelling present. Normal range  of motion.     Cervical back: Normal range of motion and neck supple.     Comments: 1-2+ LE edema  Skin:    General: Skin is warm and dry.  Neurological:     General: No focal deficit present.     Mental Status: She is alert.  Psychiatric:     Comments: Odd affect. Tangential thought process     Author: Phillips Grout, MD 04/16/2021 9:39 AM  Went back in room today to update her mother.  Patient screaming and hollering cursing me out calling me a bitch tell me to get out the room when I asked her how much Dilaudid orally she took at home.  She immediately said that I was accusing her of being a drug addict and she was getting drugs off the street.  There was no mention of this at all.  Patient continues to refuse p.o. medications.  She is starting to have major withdrawal now at  this time.  We will increase Dilaudid IV and continue to offer p.o.  Clearly drug-seeking behavior with withdrawal from opiates.  Still looking and ruling out other serious conditions.  We will check a lactic acid and serial.  Imaging has been negative thus far.  Abdominal exam is benign.  Neurological exam is normal besides behavior issues.  CT head ordered due to psychiatric wishes to do so however no focal neurological deficits.  Likely low yield.  I have discussed with mom outside in the hallway with patient's knowledge of doing so.  Per mom this is typical behavior for [place].  She hops from emergency room to emergency room asking for more pain meds and when she does not get what she wants she curses people out.  She does see a pain clinic physician locally.  Unknown if she runs out of her pain medications at home frequently.  Continue supportive care.

## 2021-04-17 LAB — HEMOGLOBIN AND HEMATOCRIT, BLOOD
HCT: 23.3 % — ABNORMAL LOW (ref 36.0–46.0)
Hemoglobin: 7.2 g/dL — ABNORMAL LOW (ref 12.0–15.0)

## 2021-04-17 LAB — BASIC METABOLIC PANEL
Anion gap: 12 (ref 5–15)
BUN: 50 mg/dL — ABNORMAL HIGH (ref 6–20)
CO2: 14 mmol/L — ABNORMAL LOW (ref 22–32)
Calcium: 8.9 mg/dL (ref 8.9–10.3)
Chloride: 115 mmol/L — ABNORMAL HIGH (ref 98–111)
Creatinine, Ser: 2 mg/dL — ABNORMAL HIGH (ref 0.44–1.00)
GFR, Estimated: 29 mL/min — ABNORMAL LOW (ref >60)
Glucose, Bld: 164 mg/dL — ABNORMAL HIGH (ref 70–99)
Potassium: 4.5 mmol/L (ref 3.5–5.1)
Sodium: 141 mmol/L (ref 135–145)

## 2021-04-17 LAB — CBC WITH DIFFERENTIAL/PLATELET
Abs Immature Granulocytes: 0.07 10*3/uL (ref 0.00–0.07)
Basophils Absolute: 0 10*3/uL (ref 0.0–0.1)
Basophils Relative: 0 %
Eosinophils Absolute: 0.2 10*3/uL (ref 0.0–0.5)
Eosinophils Relative: 1 %
HCT: 21 % — ABNORMAL LOW (ref 36.0–46.0)
Hemoglobin: 6.4 g/dL — CL (ref 12.0–15.0)
Immature Granulocytes: 1 %
Lymphocytes Relative: 11 %
Lymphs Abs: 1.5 10*3/uL (ref 0.7–4.0)
MCH: 28.2 pg (ref 26.0–34.0)
MCHC: 30.5 g/dL (ref 30.0–36.0)
MCV: 92.5 fL (ref 80.0–100.0)
Monocytes Absolute: 0.9 10*3/uL (ref 0.1–1.0)
Monocytes Relative: 7 %
Neutro Abs: 10.9 10*3/uL — ABNORMAL HIGH (ref 1.7–7.7)
Neutrophils Relative %: 80 %
Platelets: 354 10*3/uL (ref 150–400)
RBC: 2.27 MIL/uL — ABNORMAL LOW (ref 3.87–5.11)
RDW: 16.4 % — ABNORMAL HIGH (ref 11.5–15.5)
WBC: 13.6 10*3/uL — ABNORMAL HIGH (ref 4.0–10.5)
nRBC: 0 % (ref 0.0–0.2)

## 2021-04-17 LAB — GLUCOSE, CAPILLARY
Glucose-Capillary: 149 mg/dL — ABNORMAL HIGH (ref 70–99)
Glucose-Capillary: 183 mg/dL — ABNORMAL HIGH (ref 70–99)
Glucose-Capillary: 240 mg/dL — ABNORMAL HIGH (ref 70–99)
Glucose-Capillary: 204 mg/dL — ABNORMAL HIGH (ref 70–99)

## 2021-04-17 LAB — MAGNESIUM: Magnesium: 2 mg/dL (ref 1.7–2.4)

## 2021-04-17 NOTE — Care Management Important Message (Signed)
Important Message  Patient Details IM Letter given to the Patient. Name: Deborah [name] MRN: [id number] Date of Birth: 1965-09-05   Medicare Important Message Given:  Yes     Kerin Salen 04/17/2021, 11:21 AM

## 2021-04-17 NOTE — Progress Notes (Signed)
Progress Note   Patient: Deborah [name] [id number] DOB: 23-Mar-1965 DOA: 04/13/2021     4 DOS: the patient was seen and examined on 04/17/2021   Brief hospital course: Deborah [name] is a 56 y.o. female with medical history significant of chronic pain, DM2, DM gastroparesis, HFrEF, A fib on eliquis, CKD3b. Presenting with N/V, abdominal pain. N/V started day prior to admission. Tried zofran, but it didn't help. She didn't notice any hematemesis. Stomach pain started morning of admission Due to her pain she presented to the ER.   Of note, recent pyloromyotomy procedure on 03/27/21 in [place] for DM gastroparesis.Patient underwent upper GI series on 03/28/2021 which demonstrated no leak. She was then transitioned to clears which she tolerated well.   With further information from patient on 2/7, she states she reportedly was in the hospital in [facility] for approximately 3 weeks being treated with fentanyl and other opioids.  When asked about her last bowel movements, she is unable to adequately answer and constantly changing her answer.  Possibly even over a week prior to admission.  She also has answers that are almost tangential when being asked the question and she is constantly having to be redirected to the initial question.  She seems to continue to focus on "leg pain" but then changes her answers to having abdominal pain.  Subjective Markedly increased Dilaudid and Ativan yesterday.  Now sedated this morning however per nursing staff was still very agitated and combative all night long.   Care everywhere reviewed.  Patient has recurrent hospitalizations for chronic abdominal pain with extensive work-up.  A month ago had a stroke work-up which essentially was negative at an outside facility and had a routine pyloromyotomy procedure.  History of stroke in 2012.  On Eliquis for A-fib.  Assessment and Plan:  Abdominal pain N/V DM gastroparesis Constipation  - CT shows what  appears to be stool burden throughout; her answer changes about last bowel movement but I am inclined to think it has been several days especially with recent prolonged hospitalization in [place] where she was given prolonged course of opioids as she reports -Database reviewed and she also is receiving Dilaudid 4 mg tablets (last filled 02/20/2021, quantity 60 for 30-day supply).  Her fill history is very consistent almost monthly. - per careeverywhere so does show that she was hospitalized in [place] from 1/13 - 1/27 -My suspicion is still underlying constipation contributing to her symptoms notably the pain/distention as well as her nausea/vomiting and lack of bowel movements.Has had successful bowel movements over the last 24 hoursHas had excess full bowel movements in the last 24 hours -Patient does demonstrate some subtle pain seeking tendencies notably wanting Dilaudid and changing her answer where her pain is -Holding off on pain medication at this time as she does not appear to be in any overt distress during my exam and would also worsen underlying constipation; I do perceive an underlying psych component to some of this as well - restart on CLD and advance as tolearted -Patient has been refusing to answer questions and refusing p.o. Dilaudid.  Start PPI.  Give one-time dose of Dilaudid low-dose.  Suspect all drug-seeking behavior. -All imaging has been negative thus far.  Repeat acute abdominal series neg.  Finally had a bowel movement.  No active vomiting.   DM2 - A1c 7.5% on 04/13/21 -Continue SSI and CBG monitoring -Semglee ordered at 35 units daily; home dose is Lantus 50 units daily.  Will further decrease Semglee down  to 20 units and can be further modified as needed   AKI on CKD3b - fluids but she did not want them to continue due to "swelling" - CT negative for renal obstruction   Hypokalemia -Replete as needed   Anemia of chronic disease - no evidence of bleed, no bright  red blood per rectum no melanotic stool no vomiting per nursing staff, hemoglobin on admission 8 currently 6.4 likely him delusional and exacerbation of chronic anemia.  Continue to monitor.  Transfuse 1 unit if persistent hemoglobin less than 7.  Monitor closely for any development of bleeding issues.  Holding Eliquis for now.   Hx of CVA - recent CVA w/ right side residuals; epic reviewed, has negative CTH on 03/23/21, but no MRI for follow up due to body habitus (was seen by neurology) - can have PT/OT eval  Chronic systolic HF -Initially treated with fluids due to worsened renal function which did improve; patient wished for fluids to be stopped due to worsening swelling     - holding diuretic for today d/t AKI   HTN - resume home regimen minus nephrotoxins   Afib - continue eliquis, metoprolol  Opiate withdrawal Patient has been refusing to take her p.o. Dilaudid.  Increased doses of IV Dilaudid yesterday and added Ativan due to severe agitation and aggression with staff members as well as myself.  Sedated this morning but I suspect she will wake up throughout the day and continue to have withdrawal symptoms.  Difficult situation.  Continue Dilaudid and Ativan through the day which is significantly less Dilaudid than she normally gets chronically p.o.  In order to help with withdrawal symptoms.  Per mom conversation yesterday this is routine pattern that occurs with patient.  Cannot get Reglan for her gastroparesis.   psychiatric consultation.  Requesting CT of the head although no focal neurological deficits and EEG.  Patient has no evidence of seizure activity will defer EEG to psychiatric team.  Await final recommendations.  Query whether or not she has the capacity and capability of making decisions.  Very difficult case to ascertain with patient refusal of many modalities of treatment.  CT head negative.  04/16/2021 went back in room today to update her mother.  Patient screaming and  hollering cursing me out calling me a bitch tell me to get out the room when I asked her how much Dilaudid orally she took at home.  She immediately said that I was accusing her of being a drug addict and she was getting drugs off the street.  There was no mention of this at all.  Patient continues to refuse p.o. medications.  She is starting to have major withdrawal now at this time.  We will increase Dilaudid IV and continue to offer p.o.  Clearly drug-seeking behavior with withdrawal from opiates.  Still looking and ruling out other serious conditions.  We will check a lactic acid and serial.  Imaging has been negative thus far.  Abdominal exam is benign.  Neurological exam is normal besides behavior issues.  CT head ordered due to psychiatric wishes to do so however no focal neurological deficits.  Likely low yield.  I have discussed with mom outside in the hallway with patient's knowledge of doing so.  Per mom this is typical behavior for [place].  She hops from emergency room to emergency room asking for more pain meds and when she does not get what she wants she curses people out.  She does see a pain  clinic physician locally.  Unknown if she runs out of her pain medications at home frequently.  Continue supportive care.   Physical Exam: Vitals:   04/16/21 2230 04/17/21 0000 04/17/21 0100 04/17/21 0500  BP: (!) 120/99 (!) 141/71 (!) 94/57 121/75  Pulse: (!) 118 (!) 117 (!) 122 (!) 101  Resp:      Temp: 98.6 F (37 C)   98.7 F (37.1 C)  TempSrc:    Oral  SpO2: 100%   100%  Weight:    126.2 kg  Height:      Physical Exam Constitutional:      General: She is not in acute distress.    Appearance: Normal appearance. She is obese. She is not ill-appearing.     Comments: Pleasantly sleeping not agitated or combative at this time easily arouses to voice.  Says no when asked if she is in pain.  HENT:     Head: Normocephalic and atraumatic.     Mouth/Throat:     Mouth: Mucous membranes are  moist.  Eyes:     Extraocular Movements: Extraocular movements intact.  Cardiovascular:     Rate and Rhythm: Normal rate and regular rhythm.  Pulmonary:     Effort: Pulmonary effort is normal.     Breath sounds: Normal breath sounds.  Abdominal:     Comments: Obese, soft, BS normal bowel sounds no rebound no guarding nondistended  Musculoskeletal:        General: Swelling present. Normal range of motion.     Cervical back: Normal range of motion and neck supple.     Comments: 1-2+ LE edema  Skin:    General: Skin is warm and dry.  Neurological:     General: No focal deficit present.     Mental Status: She is alert.  Psychiatric:     Comments: Somnolent at this time     Author: Phillips Grout, MD 04/17/2021 9:30 AM

## 2021-04-17 NOTE — Progress Notes (Signed)
OT Cancellation Note  Patient Details Name: Deborah [name] MRN: [id number] DOB: 1966-02-25   Cancelled Treatment:    Reason Eval/Treat Not Completed: Medical issues which prohibited therapy Per chart review, pt with 6.4 hgb, also noted to still be refusing treatment. Will hold treatment until hemoglobin within therapeutic range and pt participatory.   Delbert Phenix OT OT pager: [employer] 04/17/2021, 11:53 AM

## 2021-04-17 NOTE — Consult Note (Signed)
°  Attempted to assess problem patient x2.  Patient was asleep this morning, then attempted to assess at 11:27 AM and patient was working with rehab therapy, sitting on the edge of the bed.   Psychiatry will continue to follow although patient does not appear to be medically stable at this time.

## 2021-04-17 NOTE — Care Plan (Signed)
Repeat H&H ordered for 2 PM today still not done.  We will secure chat nursing staff as to why labs not been done yet.

## 2021-04-17 NOTE — Progress Notes (Signed)
PT Cancellation Note  Patient Details Name: SARGUN RUMMELL MRN: [id number] DOB: 05/18/1965   Cancelled Treatment:    Reason Eval/Treat Not Completed: Medical issues which prohibited therapy. Per chart review, pt with 6.4 hgb, also noted to still be refusing treatment. Will hold treatment until hemoglobin within therapeutic range and pt participatory.     Tori Magalene Mclear PT, DPT 04/17/21, 10:13 AM

## 2021-04-18 ENCOUNTER — Encounter (HOSPITAL_COMMUNITY): Payer: Self-pay | Admitting: Internal Medicine

## 2021-04-18 DIAGNOSIS — E1165 Type 2 diabetes mellitus with hyperglycemia: Principal | ICD-10-CM

## 2021-04-18 DIAGNOSIS — I480 Paroxysmal atrial fibrillation: Secondary | ICD-10-CM

## 2021-04-18 DIAGNOSIS — N1832 Chronic kidney disease, stage 3b: Secondary | ICD-10-CM

## 2021-04-18 DIAGNOSIS — I5022 Chronic systolic (congestive) heart failure: Secondary | ICD-10-CM

## 2021-04-18 DIAGNOSIS — Z794 Long term (current) use of insulin: Secondary | ICD-10-CM

## 2021-04-18 LAB — BASIC METABOLIC PANEL
Anion gap: 8 (ref 5–15)
BUN: 51 mg/dL — ABNORMAL HIGH (ref 6–20)
CO2: 17 mmol/L — ABNORMAL LOW (ref 22–32)
Calcium: 9.1 mg/dL (ref 8.9–10.3)
Chloride: 116 mmol/L — ABNORMAL HIGH (ref 98–111)
Creatinine, Ser: 2.1 mg/dL — ABNORMAL HIGH (ref 0.44–1.00)
GFR, Estimated: 27 mL/min — ABNORMAL LOW (ref >60)
Glucose, Bld: 156 mg/dL — ABNORMAL HIGH (ref 70–99)
Potassium: 4.1 mmol/L (ref 3.5–5.1)
Sodium: 141 mmol/L (ref 135–145)

## 2021-04-18 LAB — CBC WITH DIFFERENTIAL/PLATELET
Abs Immature Granulocytes: 0.05 10*3/uL (ref 0.00–0.07)
Basophils Absolute: 0.1 10*3/uL (ref 0.0–0.1)
Basophils Relative: 1 %
Eosinophils Absolute: 0.4 10*3/uL (ref 0.0–0.5)
Eosinophils Relative: 4 %
HCT: 21.5 % — ABNORMAL LOW (ref 36.0–46.0)
Hemoglobin: 6.4 g/dL — CL (ref 12.0–15.0)
Immature Granulocytes: 1 %
Lymphocytes Relative: 22 %
Lymphs Abs: 2.2 10*3/uL (ref 0.7–4.0)
MCH: 28.4 pg (ref 26.0–34.0)
MCHC: 29.8 g/dL — ABNORMAL LOW (ref 30.0–36.0)
MCV: 95.6 fL (ref 80.0–100.0)
Monocytes Absolute: 0.6 10*3/uL (ref 0.1–1.0)
Monocytes Relative: 6 %
Neutro Abs: 6.7 10*3/uL (ref 1.7–7.7)
Neutrophils Relative %: 66 %
Platelets: 384 10*3/uL (ref 150–400)
RBC: 2.25 MIL/uL — ABNORMAL LOW (ref 3.87–5.11)
RDW: 16.4 % — ABNORMAL HIGH (ref 11.5–15.5)
WBC: 9.9 10*3/uL (ref 4.0–10.5)
nRBC: 0 % (ref 0.0–0.2)

## 2021-04-18 LAB — GLUCOSE, CAPILLARY
Glucose-Capillary: 165 mg/dL — ABNORMAL HIGH (ref 70–99)
Glucose-Capillary: 194 mg/dL — ABNORMAL HIGH (ref 70–99)
Glucose-Capillary: 216 mg/dL — ABNORMAL HIGH (ref 70–99)
Glucose-Capillary: 171 mg/dL — ABNORMAL HIGH (ref 70–99)
Glucose-Capillary: 146 mg/dL — ABNORMAL HIGH (ref 70–99)

## 2021-04-18 LAB — PREPARE RBC (CROSSMATCH)

## 2021-04-18 LAB — MAGNESIUM: Magnesium: 2.1 mg/dL (ref 1.7–2.4)

## 2021-04-18 MED ORDER — SODIUM CHLORIDE 0.9% IV SOLUTION: Freq: Once | INTRAVENOUS | Status: AC

## 2021-04-18 NOTE — Progress Notes (Signed)
Progress Note    Deborah [name]  QMV:[id number] DOB: 12-20-1965  DOA: 04/13/2021 PCP: Cena Benton, MD      Brief Narrative:    Medical records reviewed and are as summarized below:  Deborah [name] is a 56 y.o. female with medical history significant for recent pyloromyotomy on 03/27/2021 in [place] for diabetic gastroparesis, recurrent hospitalization for chronic abdominal pain, chronic pain, stroke, DM2, HFrEF, A fib on eliquis, CKD3b.  She presented to the hospital because of nausea, vomiting and abdominal pain.     Assessment/Plan:   Principal Problem:   Intractable nausea and vomiting Active Problems:   Type 2 diabetes mellitus with hyperglycemia, with long-term current use of insulin (HCC)   Chronic kidney disease, stage 3b (HCC)   Hypokalemia   Diabetic gastroparesis (HCC)   Chronic systolic CHF (congestive heart failure) (HCC)   PAF (paroxysmal atrial fibrillation) (HCC)    Body mass index is 45.06 kg/m.  (Morbid obesity)  Diabetic gastroparesis, nausea, vomiting, abdominal pain, recent pyloromyotomy on 03/27/2021: She moved her bowels on 04/17/2021.  Continue soft diet.  Continue analgesics as needed.  She keeps asking for IV Dilaudid for pain.  AKI on CKD stage IIIb, metabolic acidosis: Creatinine is stable.  Continue to monitor.  Acute on chronic anemia, anemia of chronic disease: Hemoglobin is down to 6.4.  Transfuse 1 unit of packed red blood cells.  Risk, benefits and alternatives of blood transfusion were discussed with the patient and she agrees to proceed with blood transfusion.  Monitor H&H.  Chronic systolic CHF: Compensated  Paroxysmal atrial fibrillation: Continue metoprolol and Eliquis  Opioid use disorder with suspected opioid withdrawal, intermittent agitation: She remains on Dilaudid for pain.  Type II DM: Continue glargine and use NovoLog as needed for hyperglycemia   Diet Order             DIET SOFT Room  service appropriate? Yes; Fluid consistency: Thin  Diet effective now                      Consultants: Psychiatrist  Procedures: None    Medications:    alum & mag hydroxide-simeth  30 mL Oral Once   atorvastatin  10 mg Oral Daily   baclofen  10 mg Oral TID   bisacodyl  10 mg Rectal Daily   chlorhexidine  15 mL Mouth Rinse BID   folic acid  1 mg Oral Daily   hydrALAZINE  50 mg Oral TID   insulin aspart  0-20 Units Subcutaneous TID WC   insulin aspart  0-5 Units Subcutaneous QHS   insulin glargine-yfgn  20 Units Subcutaneous QHS   isosorbide mononitrate  60 mg Oral Daily   lactulose  20 g Oral BID   linaclotide  145 mcg Oral QAC breakfast   mouth rinse  15 mL Mouth Rinse q12n4p   metoprolol succinate  50 mg Oral Daily   pantoprazole (PROTONIX) IV  40 mg Intravenous Q12H   polyethylene glycol  17 g Oral BID   senna-docusate  2 tablet Oral BID   Continuous Infusions:  thiamine injection 400 mg (04/17/21 1411)     Anti-infectives (From admission, onward)    None              Family Communication/Anticipated D/C date and plan/Code Status   DVT prophylaxis:      Code Status: Full Code  Family Communication: None Disposition Plan: Plan to discharge home when medically stable  Status is: Inpatient Remains inpatient appropriate because: Pain control               Subjective:   c/o pain and numbness in the left arm.  She also complains of abdominal pain.  No vomiting, diarrhea or constipation  Objective:    Vitals:   04/17/21 1745 04/17/21 2125 04/18/21 0500 04/18/21 0645  BP: 136/84 133/69  137/73  Pulse:  90  85  Resp:  20  18  Temp:  98.2 F (36.8 C)  (!) 97.5 F (36.4 C)  TempSrc:  Oral  Oral  SpO2:  100%  97%  Weight:   126.6 kg   Height:       No data found.   Intake/Output Summary (Last 24 hours) at 04/18/2021 0939 Last data filed at 04/17/2021 1728 Gross per 24 hour  Intake 1320 ml  Output --  Net 1320 ml    Filed Weights   04/16/21 0500 04/17/21 0500 04/18/21 0500  Weight: 122.9 kg 126.2 kg 126.6 kg    Exam:  GEN: NAD SKIN: No rash EYES: No pallor or icterus ENT: MMM CV: RRR PULM: CTA B ABD: soft, obese, NT, +BS CNS: AAO x 3, non focal EXT: No edema or tenderness        Data Reviewed:   I have personally reviewed following labs and imaging studies:  Labs: Labs show the following:   Basic Metabolic Panel: Recent Labs  Lab 04/13/21 0910 04/13/21 1400 04/14/21 0419 04/16/21 0338 04/17/21 0717 04/18/21 0649  NA 138  --  137 137 141 141  K 3.4*  --  4.4 3.7 4.5 4.1  CL 110  --  112* 112* 115* 116*  CO2 17*  --  14* 16* 14* 17*  GLUCOSE 245*  --  175* 157* 164* 156*  BUN 38*  --  40* 51* 50* 51*  CREATININE 2.54*  --  2.07* 2.25* 2.00* 2.10*  CALCIUM 8.6*  --  8.6* 9.2 8.9 9.1  MG  --  1.3*  --  2.3 2.0 2.1   GFR Estimated Creatinine Clearance: 40.2 mL/min (A) (by C-G formula based on SCr of 2.1 mg/dL (H)). Liver Function Tests: Recent Labs  Lab 04/13/21 0910 04/14/21 0419  AST 29 20  ALT 25 22  ALKPHOS 88 80  BILITOT 0.2* 0.4  PROT 8.4* 7.9  ALBUMIN 3.2* 3.4*   Recent Labs  Lab 04/13/21 0910  LIPASE 36   Recent Labs  Lab 04/16/21 1734  AMMONIA <10   Coagulation profile No results for input(s): INR, PROTIME in the last 168 hours.  CBC: Recent Labs  Lab 04/13/21 0910 04/14/21 0620 04/16/21 0338 04/17/21 0717 04/17/21 2016 04/18/21 0649  WBC 14.6* 14.7* 12.6* 13.6*  --  9.9  NEUTROABS 10.2*  --  9.8* 10.9*  --  6.7  HGB 8.0* 7.3* 7.1* 6.4* 7.2* 6.4*  HCT 26.3* 24.0* 23.6* 21.0* 23.3* 21.5*  MCV 94.3 95.6 93.7 92.5  --  95.6  PLT 395 359 361 354  --  384   Cardiac Enzymes: Recent Labs  Lab 04/16/21 1454  CKTOTAL 97   BNP (last 3 results) No results for input(s): PROBNP in the last 8760 hours. CBG: Recent Labs  Lab 04/17/21 1135 04/17/21 1643 04/17/21 2122 04/18/21 0642 04/18/21 0753  GLUCAP 183* 240* 204* 165* 194*    D-Dimer: No results for input(s): DDIMER in the last 72 hours. Hgb A1c: No results for input(s): HGBA1C in the last 72 hours. Lipid Profile: No  results for input(s): CHOL, HDL, LDLCALC, TRIG, CHOLHDL, LDLDIRECT in the last 72 hours. Thyroid function studies: Recent Labs    04/16/21 1734  TSH 1.384   Anemia work up: No results for input(s): VITAMINB12, FOLATE, FERRITIN, TIBC, IRON, RETICCTPCT in the last 72 hours. Sepsis Labs: Recent Labs  Lab 04/14/21 0620 04/16/21 0338 04/16/21 1459 04/16/21 1734 04/17/21 0717 04/18/21 0649  WBC 14.7* 12.6*  --   --  13.6* 9.9  LATICACIDVEN  --   --  1.0 1.1  --   --     Microbiology Recent Results (from the past 240 hour(s))  Resp Panel by RT-PCR (Flu A&B, Covid) Nasopharyngeal Swab     Status: None   Collection Time: 04/13/21  9:10 AM   Specimen: Nasopharyngeal Swab; Nasopharyngeal(NP) swabs in vial transport medium  Result Value Ref Range Status   SARS Coronavirus 2 by RT PCR NEGATIVE NEGATIVE Final    Comment: (NOTE) SARS-CoV-2 target nucleic acids are NOT DETECTED.  The SARS-CoV-2 RNA is generally detectable in upper respiratory specimens during the acute phase of infection. The lowest concentration of SARS-CoV-2 viral copies this assay can detect is 138 copies/mL. A negative result does not preclude SARS-Cov-2 infection and should not be used as the sole basis for treatment or other patient management decisions. A negative result may occur with  improper specimen collection/handling, submission of specimen other than nasopharyngeal swab, presence of viral mutation(s) within the areas targeted by this assay, and inadequate number of viral copies(<138 copies/mL). A negative result must be combined with clinical observations, patient history, and epidemiological information. The expected result is Negative.  Fact Sheet for Patients:  EntrepreneurPulse.com.au  Fact Sheet for Healthcare Providers:   IncredibleEmployment.be  This test is no t yet approved or cleared by the [place] FDA and  has been authorized for detection and/or diagnosis of SARS-CoV-2 by FDA under an Emergency Use Authorization (EUA). This EUA will remain  in effect (meaning this test can be used) for the duration of the COVID-19 declaration under Section 564(b)(1) of the Act, 21 U.S.C.section 360bbb-3(b)(1), unless the authorization is terminated  or revoked sooner.       Influenza A by PCR NEGATIVE NEGATIVE Final   Influenza B by PCR NEGATIVE NEGATIVE Final    Comment: (NOTE) The Xpert Xpress SARS-CoV-2/FLU/RSV plus assay is intended as an aid in the diagnosis of influenza from Nasopharyngeal swab specimens and should not be used as a sole basis for treatment. Nasal washings and aspirates are unacceptable for Xpert Xpress SARS-CoV-2/FLU/RSV testing.  Fact Sheet for Patients: EntrepreneurPulse.com.au  Fact Sheet for Healthcare Providers: IncredibleEmployment.be  This test is not yet approved or cleared by the [place] FDA and has been authorized for detection and/or diagnosis of SARS-CoV-2 by FDA under an Emergency Use Authorization (EUA). This EUA will remain in effect (meaning this test can be used) for the duration of the COVID-19 declaration under Section 564(b)(1) of the Act, 21 U.S.C. section 360bbb-3(b)(1), unless the authorization is terminated or revoked.  Performed at [facility], [place] [address], [place], [place] [address]     Procedures and diagnostic studies:  CT HEAD WO CONTRAST (5MM)  Result Date: 04/16/2021 CLINICAL DATA:  Mental status change EXAM: CT HEAD WITHOUT CONTRAST TECHNIQUE: Contiguous axial images were obtained from the base of the skull through the vertex without intravenous contrast. RADIATION DOSE REDUCTION: This exam was performed according to the departmental dose-optimization program  which includes automated exposure control, adjustment of the mA and/or  kV according to patient size and/or use of iterative reconstruction technique. COMPARISON:  CT head 03/19/2017 FINDINGS: Brain: Motion degraded study. Two scans were obtained, both are degraded by motion. Ventricle size and cerebral volume normal. Negative for infarct, hemorrhage, mass. Vascular: Negative for hyperdense vessel Skull: Negative Sinuses/Orbits: Paranasal sinuses clear. No orbital lesion. Left cataract extraction Other: None IMPRESSION: No acute abnormality Motion degraded study. Electronically Signed   By: Franchot Gallo M.D.   On: 04/16/2021 17:20               LOS: 5 days   Avyay Coger  [facility]   Pager on [email or web address]. If 7PM-7AM, please contact night-coverage at [email or web address]     04/18/2021, 9:39 AM

## 2021-04-18 NOTE — Plan of Care (Signed)
Pt c/o 10/10 pain at beginning of shift. Offered PO and IV pain medication. Pt stated she wanted IV medication to "take the edge right off". Pt then stated that she did not want to be woken up for any medications. Education provided. Pt slept well this shift, VSS.

## 2021-04-19 LAB — CBC WITH DIFFERENTIAL/PLATELET
Abs Immature Granulocytes: 0.05 10*3/uL (ref 0.00–0.07)
Basophils Absolute: 0 10*3/uL (ref 0.0–0.1)
Basophils Relative: 0 %
Eosinophils Absolute: 0.3 10*3/uL (ref 0.0–0.5)
Eosinophils Relative: 3 %
HCT: 23.7 % — ABNORMAL LOW (ref 36.0–46.0)
Hemoglobin: 7 g/dL — ABNORMAL LOW (ref 12.0–15.0)
Immature Granulocytes: 1 %
Lymphocytes Relative: 20 %
Lymphs Abs: 2 10*3/uL (ref 0.7–4.0)
MCH: 28.5 pg (ref 26.0–34.0)
MCHC: 29.5 g/dL — ABNORMAL LOW (ref 30.0–36.0)
MCV: 96.3 fL (ref 80.0–100.0)
Monocytes Absolute: 0.7 10*3/uL (ref 0.1–1.0)
Monocytes Relative: 7 %
Neutro Abs: 7 10*3/uL (ref 1.7–7.7)
Neutrophils Relative %: 69 %
Platelets: 374 10*3/uL (ref 150–400)
RBC: 2.46 MIL/uL — ABNORMAL LOW (ref 3.87–5.11)
RDW: 15.9 % — ABNORMAL HIGH (ref 11.5–15.5)
WBC: 10 10*3/uL (ref 4.0–10.5)
nRBC: 0 % (ref 0.0–0.2)

## 2021-04-19 LAB — GLUCOSE, CAPILLARY
Glucose-Capillary: 155 mg/dL — ABNORMAL HIGH (ref 70–99)
Glucose-Capillary: 134 mg/dL — ABNORMAL HIGH (ref 70–99)
Glucose-Capillary: 225 mg/dL — ABNORMAL HIGH (ref 70–99)
Glucose-Capillary: 195 mg/dL — ABNORMAL HIGH (ref 70–99)

## 2021-04-19 LAB — BASIC METABOLIC PANEL
Anion gap: 7 (ref 5–15)
BUN: 45 mg/dL — ABNORMAL HIGH (ref 6–20)
CO2: 16 mmol/L — ABNORMAL LOW (ref 22–32)
Calcium: 8.7 mg/dL — ABNORMAL LOW (ref 8.9–10.3)
Chloride: 117 mmol/L — ABNORMAL HIGH (ref 98–111)
Creatinine, Ser: 2.01 mg/dL — ABNORMAL HIGH (ref 0.44–1.00)
GFR, Estimated: 28 mL/min — ABNORMAL LOW (ref >60)
Glucose, Bld: 173 mg/dL — ABNORMAL HIGH (ref 70–99)
Potassium: 3.9 mmol/L (ref 3.5–5.1)
Sodium: 140 mmol/L (ref 135–145)

## 2021-04-19 LAB — MAGNESIUM: Magnesium: 1.8 mg/dL (ref 1.7–2.4)

## 2021-04-19 IMAGING — DX DG CHEST 2V
2 series · 2 of 2 positions shown · non-contrast
Comparison: 03/10/2019

CLINICAL DATA: Chest pain.  [name].  Nausea.

EXAM:
CHEST - 2 VIEW

[chest lat]
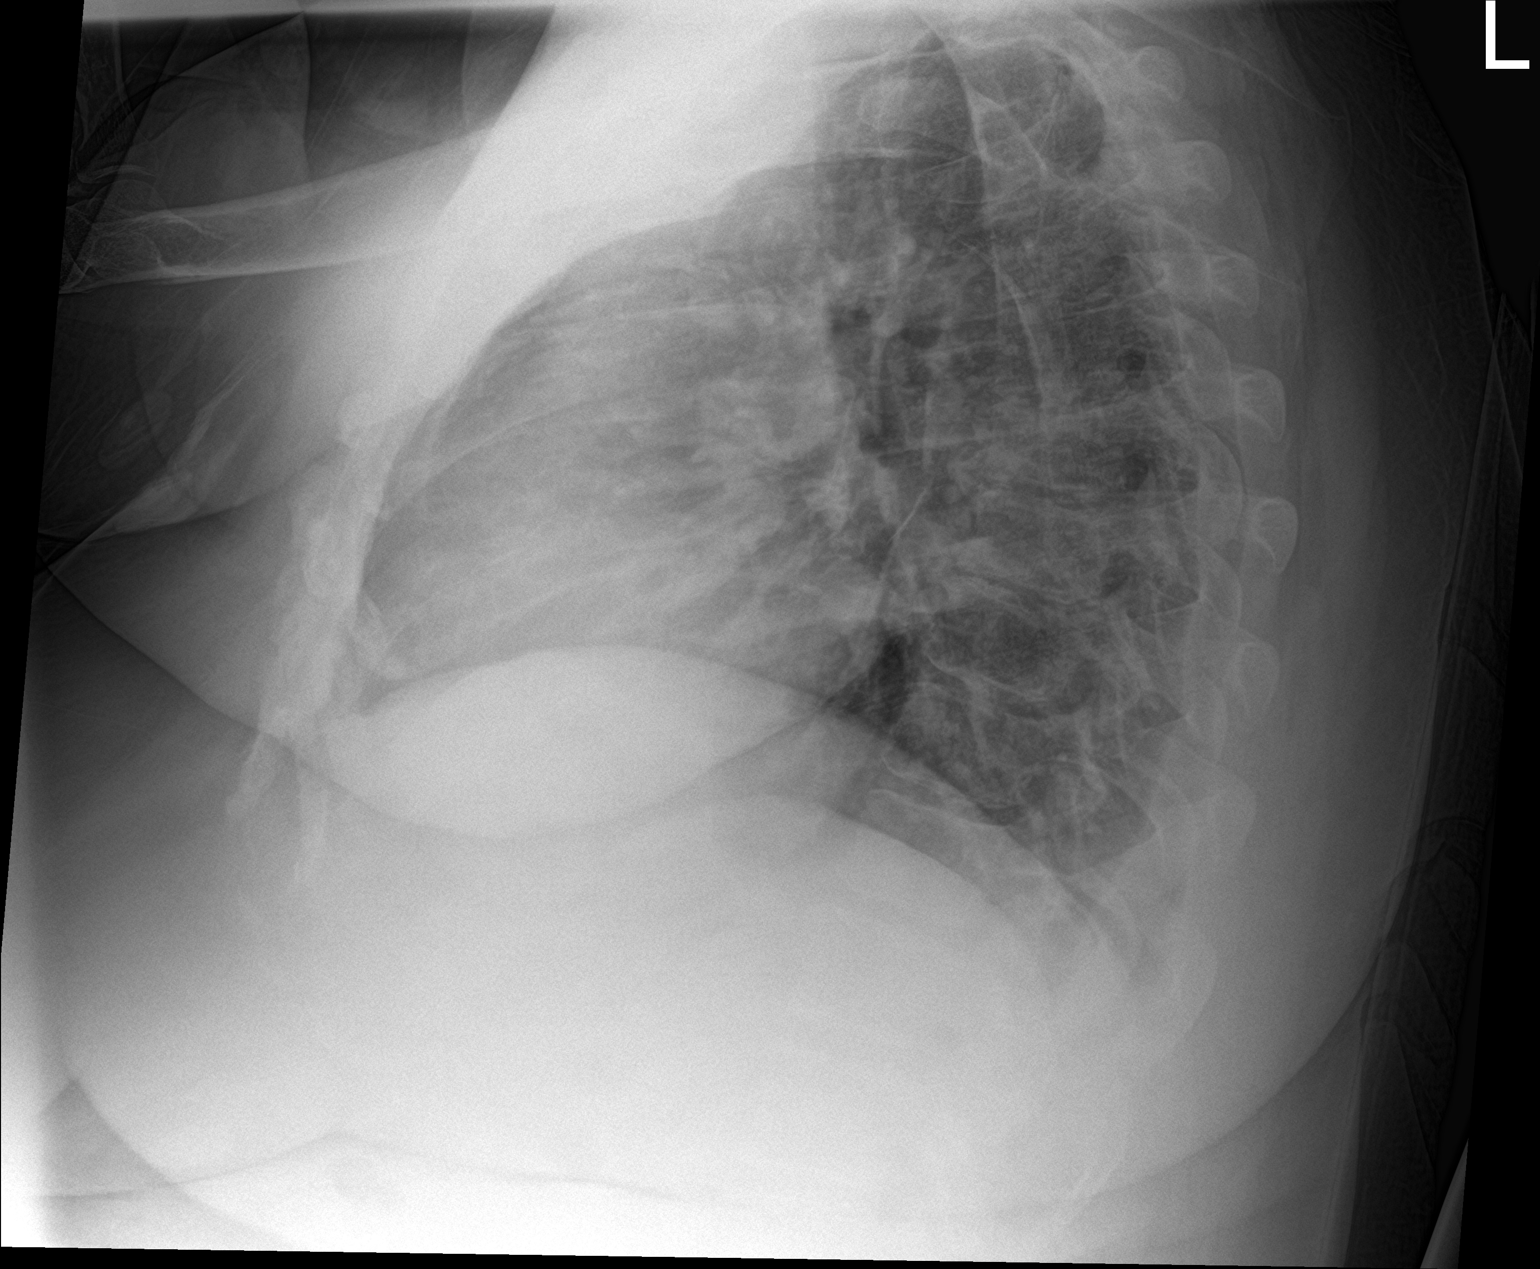

[chest ap]
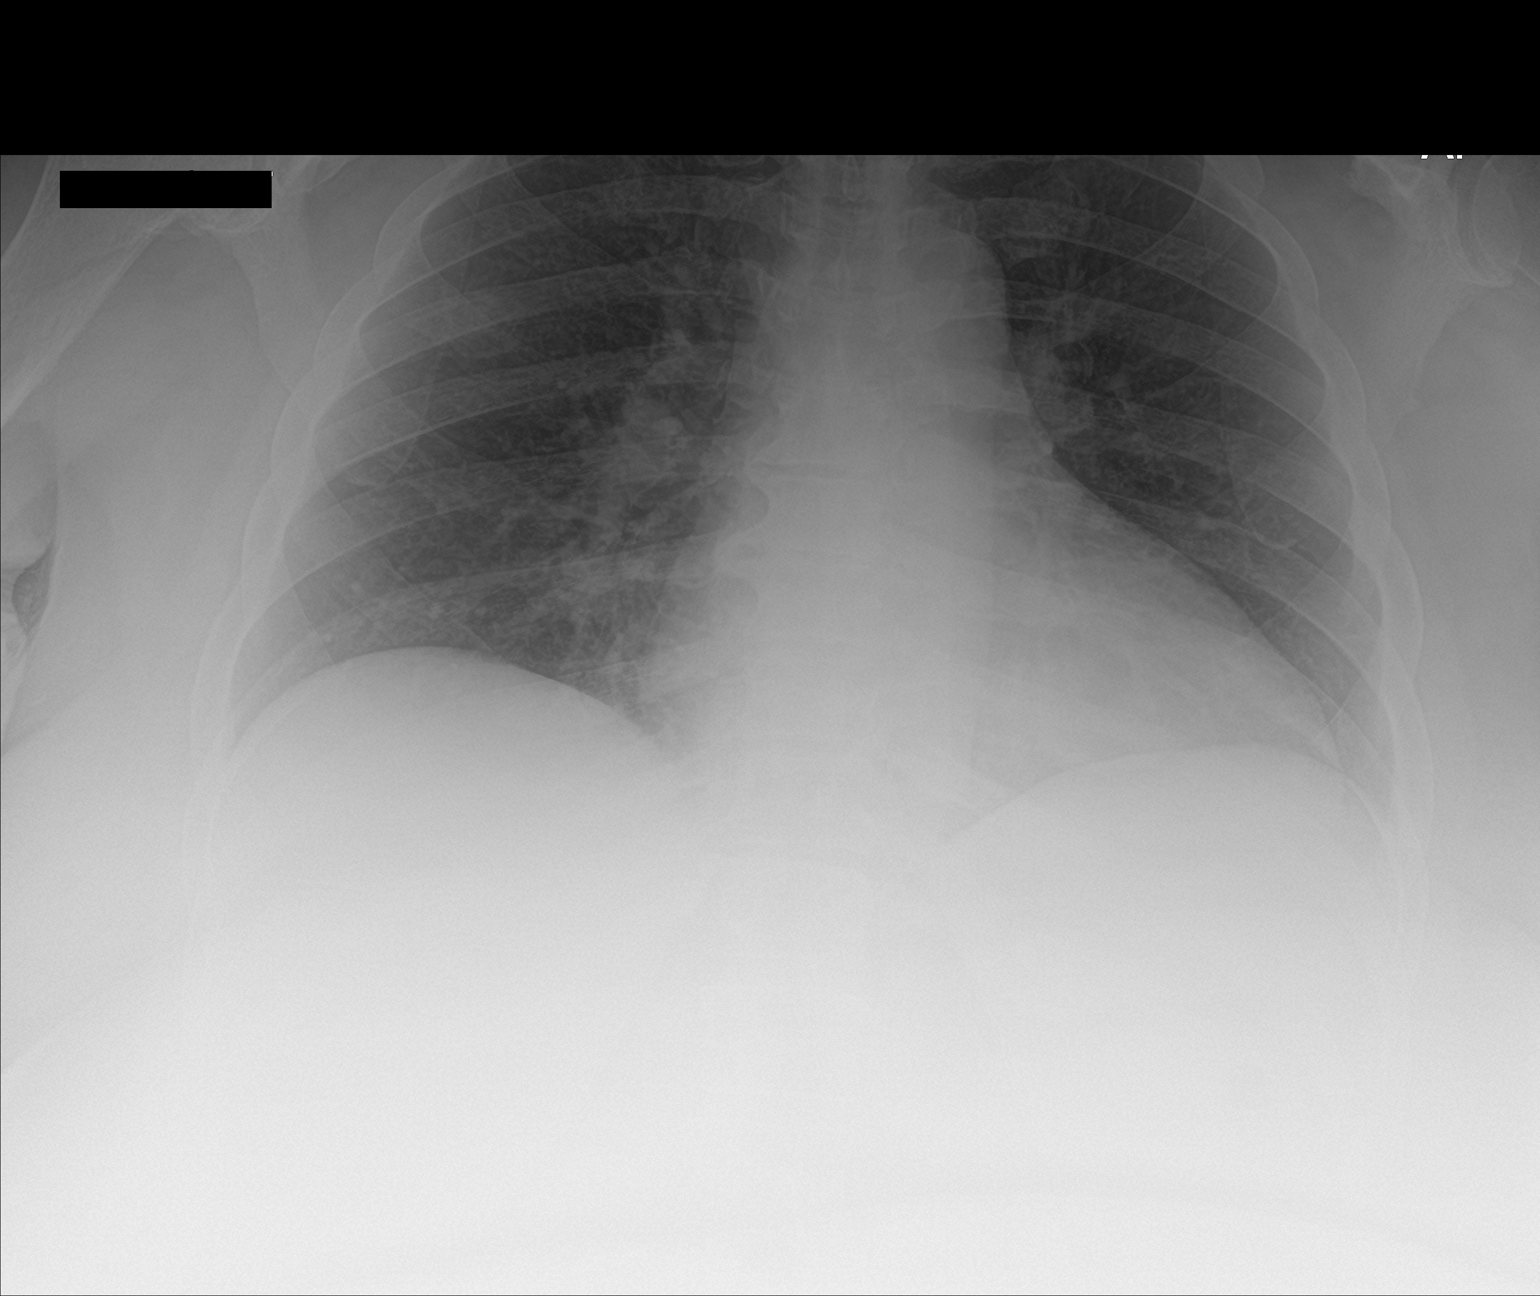

[2 of 2 positions shown; findings below may reference images not displayed]

FINDINGS: Both views are degraded by patient body habitus. Midline trachea.
Borderline cardiomegaly. Mediastinal contours otherwise within
normal limits. No pleural effusion or pneumothorax. Clear lungs. No
congestive failure. Low lung volumes.
IMPRESSION: Mild cardiomegaly and low lung volumes, without acute disease.

## 2021-04-19 MED ORDER — SODIUM BICARBONATE 650 MG PO TABS
650.0000 mg | ORAL_TABLET | Freq: Three times a day (TID) | ORAL | Status: DC
Start: 2021-04-19 — End: 2021-04-21
  Administered 2021-04-19 – 2021-04-20 (×5): 650 mg via ORAL
  Filled 2021-04-19 (×5): qty 1

## 2021-04-19 MED ORDER — IRON SUCROSE 20 MG/ML IV SOLN
200.0000 mg | Freq: Every day | INTRAVENOUS | Status: AC
  Administered 2021-04-19 – 2021-04-20 (×2): 200 mg via INTRAVENOUS
  Filled 2021-04-19 (×2): qty 10

## 2021-04-19 NOTE — Progress Notes (Signed)
Progress Note    Deborah [name]  ZOX:[id number] DOB: [birth year]  DOA: 04/13/2021 PCP: Cena Benton, MD      Brief Narrative:    Medical records reviewed and are as summarized below:  Deborah [name] is a 56 y.o. female with medical history significant for recent pyloromyotomy on 03/27/2021 in [place] for diabetic gastroparesis, recurrent hospitalization for chronic abdominal pain, chronic pain, stroke, DM2, HFrEF, A fib on eliquis, CKD3b.  She presented to the hospital because of nausea, vomiting and abdominal pain.     Assessment/Plan:   Principal Problem:   Intractable nausea and vomiting Active Problems:   Type 2 diabetes mellitus with hyperglycemia, with long-term current use of insulin (HCC)   Chronic kidney disease, stage 3b (HCC)   Hypokalemia   Diabetic gastroparesis (HCC)   Chronic systolic CHF (congestive heart failure) (HCC)   PAF (paroxysmal atrial fibrillation) (HCC)    Body mass index is 45.06 kg/m.  (Morbid obesity)  Diabetic gastroparesis, nausea, vomiting, abdominal pain, recent pyloromyotomy on 03/27/2021: She moved her bowels on 04/17/2021.  Continue soft diet.  Continue analgesics as needed for pain  AKI on CKD stage IIIb, metabolic acidosis: Creatinine is stable.  Acidosis is getting worse.  Start sodium bicarbonate   Acute on chronic anemia, anemia of chronic disease, iron deficiency anemia: S/p transfusion with 1 unit of PRBCs on 04/18/2021.  Give IV iron sucrose today and tomorrow  Chronic systolic CHF and diastolic: Compensated.  Aldactone on hold.   2D echo in September 2020 showed EF estimated at 50 to 55% (there is an improvement in EF from 40 to 45% in November 2020)  Paroxysmal atrial fibrillation: Continue metoprolol and Eliquis  Opioid use disorder with suspected opioid withdrawal, intermittent agitation: She remains on Dilaudid for pain.  Follow-up with psychiatrist  Type II DM: Continue glargine and use  NovoLog as needed for hyperglycemia   Diet Order             DIET SOFT Room service appropriate? Yes; Fluid consistency: Thin  Diet effective now                      Consultants: Psychiatrist  Procedures: None    Medications:    alum & mag hydroxide-simeth  30 mL Oral Once   atorvastatin  10 mg Oral Daily   baclofen  10 mg Oral TID   bisacodyl  10 mg Rectal Daily   chlorhexidine  15 mL Mouth Rinse BID   folic acid  1 mg Oral Daily   hydrALAZINE  50 mg Oral TID   insulin aspart  0-20 Units Subcutaneous TID WC   insulin aspart  0-5 Units Subcutaneous QHS   insulin glargine-yfgn  20 Units Subcutaneous QHS   isosorbide mononitrate  60 mg Oral Daily   lactulose  20 g Oral BID   linaclotide  145 mcg Oral QAC breakfast   mouth rinse  15 mL Mouth Rinse q12n4p   metoprolol succinate  50 mg Oral Daily   pantoprazole (PROTONIX) IV  40 mg Intravenous Q12H   polyethylene glycol  17 g Oral BID   senna-docusate  2 tablet Oral BID   Continuous Infusions:     Anti-infectives (From admission, onward)    None              Family Communication/Anticipated D/C date and plan/Code Status   DVT prophylaxis:      Code Status: Full Code  Family Communication:  None Disposition Plan: Plan to discharge home in 1 to 2 days   Status is: Inpatient Remains inpatient appropriate because: Pain control               Subjective:   Interval events noted.  No new complaints.  She is sleepy does not provide much history today  Objective:    Vitals:   04/18/21 1721 04/18/21 1924 04/19/21 0553 04/19/21 1132  BP: 127/73 116/85 132/70 (!) 153/94  Pulse: 93 98 (!) 101   Resp: 20 17 20    Temp: 98 F (36.7 C) 98.4 F (36.9 C) 99.4 F (37.4 C)   TempSrc: Oral Oral Oral   SpO2: 100% 100% 100%   Weight:      Height:       No data found.   Intake/Output Summary (Last 24 hours) at 04/19/2021 1335 Last data filed at 04/18/2021 1924 Gross per 24 hour   Intake 534 ml  Output --  Net 534 ml   Filed Weights   04/16/21 0500 04/17/21 0500 04/18/21 0500  Weight: 122.9 kg 126.2 kg 126.6 kg    Exam:  GEN: NAD SKIN: Warm and dry EYES: No pallor or icterus ENT: MMM CV: RRR PULM: CTA B ABD: soft, ND, NT, +BS CNS: AAO x 3, non focal EXT: No edema or tenderness          Data Reviewed:   I have personally reviewed following labs and imaging studies:  Labs: Labs show the following:   Basic Metabolic Panel: Recent Labs  Lab 04/13/21 1400 04/14/21 0419 04/16/21 0338 04/17/21 0717 04/18/21 0649 04/19/21 0816  NA  --  137 137 141 141 140  K  --  4.4 3.7 4.5 4.1 3.9  CL  --  112* 112* 115* 116* 117*  CO2  --  14* 16* 14* 17* 16*  GLUCOSE  --  175* 157* 164* 156* 173*  BUN  --  40* 51* 50* 51* 45*  CREATININE  --  2.07* 2.25* 2.00* 2.10* 2.01*  CALCIUM  --  8.6* 9.2 8.9 9.1 8.7*  MG 1.3*  --  2.3 2.0 2.1 1.8   GFR Estimated Creatinine Clearance: 42 mL/min (A) (by C-G formula based on SCr of 2.01 mg/dL (H)). Liver Function Tests: Recent Labs  Lab 04/13/21 0910 04/14/21 0419  AST 29 20  ALT 25 22  ALKPHOS 88 80  BILITOT 0.2* 0.4  PROT 8.4* 7.9  ALBUMIN 3.2* 3.4*   Recent Labs  Lab 04/13/21 0910  LIPASE 36   Recent Labs  Lab 04/16/21 1734  AMMONIA <10   Coagulation profile No results for input(s): INR, PROTIME in the last 168 hours.  CBC: Recent Labs  Lab 04/13/21 0910 04/14/21 0620 04/16/21 0338 04/17/21 0717 04/17/21 2016 04/18/21 0649 04/19/21 0816  WBC 14.6* 14.7* 12.6* 13.6*  --  9.9 10.0  NEUTROABS 10.2*  --  9.8* 10.9*  --  6.7 7.0  HGB 8.0* 7.3* 7.1* 6.4* 7.2* 6.4* 7.0*  HCT 26.3* 24.0* 23.6* 21.0* 23.3* 21.5* 23.7*  MCV 94.3 95.6 93.7 92.5  --  95.6 96.3  PLT 395 359 361 354  --  384 374   Cardiac Enzymes: Recent Labs  Lab 04/16/21 1454  CKTOTAL 97   BNP (last 3 results) No results for input(s): PROBNP in the last 8760 hours. CBG: Recent Labs  Lab 04/18/21 1148  04/18/21 1611 04/18/21 2104 04/19/21 0749 04/19/21 1150  GLUCAP 216* 171* 146* 155* 134*   D-Dimer: No results for  input(s): DDIMER in the last 72 hours. Hgb A1c: No results for input(s): HGBA1C in the last 72 hours. Lipid Profile: No results for input(s): CHOL, HDL, LDLCALC, TRIG, CHOLHDL, LDLDIRECT in the last 72 hours. Thyroid function studies: Recent Labs    04/16/21 1734  TSH 1.384   Anemia work up: No results for input(s): VITAMINB12, FOLATE, FERRITIN, TIBC, IRON, RETICCTPCT in the last 72 hours. Sepsis Labs: Recent Labs  Lab 04/16/21 0338 04/16/21 1459 04/16/21 1734 04/17/21 0717 04/18/21 0649 04/19/21 0816  WBC 12.6*  --   --  13.6* 9.9 10.0  LATICACIDVEN  --  1.0 1.1  --   --   --     Microbiology Recent Results (from the past 240 hour(s))  Resp Panel by RT-PCR (Flu A&B, Covid) Nasopharyngeal Swab     Status: None   Collection Time: 04/13/21  9:10 AM   Specimen: Nasopharyngeal Swab; Nasopharyngeal(NP) swabs in vial transport medium  Result Value Ref Range Status   SARS Coronavirus 2 by RT PCR NEGATIVE NEGATIVE Final    Comment: (NOTE) SARS-CoV-2 target nucleic acids are NOT DETECTED.  The SARS-CoV-2 RNA is generally detectable in upper respiratory specimens during the acute phase of infection. The lowest concentration of SARS-CoV-2 viral copies this assay can detect is 138 copies/mL. A negative result does not preclude SARS-Cov-2 infection and should not be used as the sole basis for treatment or other patient management decisions. A negative result may occur with  improper specimen collection/handling, submission of specimen other than nasopharyngeal swab, presence of viral mutation(s) within the areas targeted by this assay, and inadequate number of viral copies(<138 copies/mL). A negative result must be combined with clinical observations, patient history, and epidemiological information. The expected result is Negative.  Fact Sheet for Patients:   EntrepreneurPulse.com.au  Fact Sheet for Healthcare Providers:  IncredibleEmployment.be  This test is no t yet approved or cleared by the [place] FDA and  has been authorized for detection and/or diagnosis of SARS-CoV-2 by FDA under an Emergency Use Authorization (EUA). This EUA will remain  in effect (meaning this test can be used) for the duration of the COVID-19 declaration under Section 564(b)(1) of the Act, 21 U.S.C.section 360bbb-3(b)(1), unless the authorization is terminated  or revoked sooner.       Influenza A by PCR NEGATIVE NEGATIVE Final   Influenza B by PCR NEGATIVE NEGATIVE Final    Comment: (NOTE) The Xpert Xpress SARS-CoV-2/FLU/RSV plus assay is intended as an aid in the diagnosis of influenza from Nasopharyngeal swab specimens and should not be used as a sole basis for treatment. Nasal washings and aspirates are unacceptable for Xpert Xpress SARS-CoV-2/FLU/RSV testing.  Fact Sheet for Patients: EntrepreneurPulse.com.au  Fact Sheet for Healthcare Providers: IncredibleEmployment.be  This test is not yet approved or cleared by the [place] FDA and has been authorized for detection and/or diagnosis of SARS-CoV-2 by FDA under an Emergency Use Authorization (EUA). This EUA will remain in effect (meaning this test can be used) for the duration of the COVID-19 declaration under Section 564(b)(1) of the Act, 21 U.S.C. section 360bbb-3(b)(1), unless the authorization is terminated or revoked.  Performed at [facility], [place] [address] [place], [place] [address]     Procedures and diagnostic studies:  No results found.             LOS: 6 days   Nakiah Osgood  [facility]   Pager on [email or web address]. If 7PM-7AM, please contact night-coverage at [email or web address]  04/19/2021, 1:35 PM

## 2021-04-20 LAB — BASIC METABOLIC PANEL
Anion gap: 5 (ref 5–15)
BUN: 36 mg/dL — ABNORMAL HIGH (ref 6–20)
CO2: 16 mmol/L — ABNORMAL LOW (ref 22–32)
Calcium: 8.9 mg/dL (ref 8.9–10.3)
Chloride: 121 mmol/L — ABNORMAL HIGH (ref 98–111)
Creatinine, Ser: 1.84 mg/dL — ABNORMAL HIGH (ref 0.44–1.00)
GFR, Estimated: 32 mL/min — ABNORMAL LOW (ref >60)
Glucose, Bld: 157 mg/dL — ABNORMAL HIGH (ref 70–99)
Potassium: 4.3 mmol/L (ref 3.5–5.1)
Sodium: 142 mmol/L (ref 135–145)

## 2021-04-20 LAB — GLUCOSE, CAPILLARY
Glucose-Capillary: 163 mg/dL — ABNORMAL HIGH (ref 70–99)
Glucose-Capillary: 187 mg/dL — ABNORMAL HIGH (ref 70–99)
Glucose-Capillary: 179 mg/dL — ABNORMAL HIGH (ref 70–99)

## 2021-04-20 LAB — CBC WITH DIFFERENTIAL/PLATELET
Abs Immature Granulocytes: 0.15 10*3/uL — ABNORMAL HIGH (ref 0.00–0.07)
Basophils Absolute: 0 10*3/uL (ref 0.0–0.1)
Basophils Relative: 0 %
Eosinophils Absolute: 0.2 10*3/uL (ref 0.0–0.5)
Eosinophils Relative: 2 %
HCT: 24.7 % — ABNORMAL LOW (ref 36.0–46.0)
Hemoglobin: 7.5 g/dL — ABNORMAL LOW (ref 12.0–15.0)
Immature Granulocytes: 1 %
Lymphocytes Relative: 16 %
Lymphs Abs: 1.9 10*3/uL (ref 0.7–4.0)
MCH: 28.6 pg (ref 26.0–34.0)
MCHC: 30.4 g/dL (ref 30.0–36.0)
MCV: 94.3 fL (ref 80.0–100.0)
Monocytes Absolute: 0.6 10*3/uL (ref 0.1–1.0)
Monocytes Relative: 5 %
Neutro Abs: 9.2 10*3/uL — ABNORMAL HIGH (ref 1.7–7.7)
Neutrophils Relative %: 76 %
Platelets: 419 10*3/uL — ABNORMAL HIGH (ref 150–400)
RBC: 2.62 MIL/uL — ABNORMAL LOW (ref 3.87–5.11)
RDW: 15.9 % — ABNORMAL HIGH (ref 11.5–15.5)
WBC: 11.8 10*3/uL — ABNORMAL HIGH (ref 4.0–10.5)
nRBC: 0.2 % (ref 0.0–0.2)

## 2021-04-20 LAB — BPAM RBC
Blood Product Expiration Date: 202303162359
ISSUE DATE / TIME: 202302111649
Unit Type and Rh: 9500

## 2021-04-20 LAB — MAGNESIUM: Magnesium: 1.5 mg/dL — ABNORMAL LOW (ref 1.7–2.4)

## 2021-04-20 LAB — TYPE AND SCREEN
ABO/RH(D): O POS
Antibody Screen: NEGATIVE
Donor AG Type: NEGATIVE
Unit division: 0

## 2021-04-20 MED ORDER — SODIUM BICARBONATE 650 MG PO TABS: 650.0000 mg | ORAL_TABLET | Freq: Two times a day (BID) | ORAL | 0 refills | Status: DC

## 2021-04-20 MED ORDER — NOVOLOG FLEXPEN 100 UNIT/ML ~~LOC~~ SOPN: 10.0000 [IU] | PEN_INJECTOR | Freq: Three times a day (TID) | SUBCUTANEOUS | 11 refills | Status: DC

## 2021-04-20 MED ORDER — LANTUS SOLOSTAR 100 UNIT/ML ~~LOC~~ SOPN: 20.0000 [IU] | PEN_INJECTOR | Freq: Every day | SUBCUTANEOUS | 11 refills | Status: DC

## 2021-04-20 NOTE — TOC Progression Note (Signed)
Transition of Care [facility]) - Progression Note    Patient Details  Name: Deborah [name] MRN: [id number] Date of Birth: 03/22/1965  Transition of Care [facility]) CM/SW Contact  Purcell Mouton, RN Phone Number: 04/20/2021, 1:35 PM  Clinical Narrative:     Spoke with pt's brother Lucianne Lei, who states pt is married, her husband is not there all the time. Lucianne Lei states that pt will need help. Will try to call pt's husband again.   Expected Discharge Plan: Home/Self Care Barriers to Discharge: Continued Medical Work up  Expected Discharge Plan and Services Expected Discharge Plan: Home/Self Care   Discharge Planning Services: CM Consult   Living arrangements for the past 2 months: Apartment Expected Discharge Date: 04/20/21                                     Social Determinants of Health (SDOH) Interventions    Readmission Risk Interventions Readmission Risk Prevention Plan 07/02/2019 06/25/2019 01/29/2019  Transportation Screening Complete Complete Complete  PCP or Specialist Appt within 3-5 Days - - Complete  Not Complete comments - - -  [place] or [place] - - Complete  Social Work Consult for [place] Planning/Counseling - - Complete  Palliative Care Screening - - Not Applicable  Medication Review Press photographer) Complete Complete Complete  PCP or Specialist appointment within 3-5 days of discharge - Complete -  [place] or Home Care Consult Complete - -  SW Recovery Care/Counseling Consult Complete Complete -  Palliative Care Screening Not Applicable Not Applicable -  [place] Not Applicable Not Applicable -  Some recent data might be hidden

## 2021-04-20 NOTE — TOC Progression Note (Addendum)
Transition of Care [facility]) - Progression Note    Patient Details  Name: Deborah [name] MRN: [id number] Date of Birth: 12-02-1965  Transition of Care [facility] CM/SW Contact  Purcell Mouton, RN Phone Number: 04/20/2021, 1:19 PM  Clinical Narrative:     Spoke with pt's mother who is at bedside. Mother states that lives alone. Pt is unable to care for herself. Mother asked if we would call pt's brother Lucianne Lei, who doses not live here. A call was made to [facility] with no answer, left VM for him to return the call.   Expected Discharge Plan: Home/Self Care Barriers to Discharge: Continued Medical Work up  Expected Discharge Plan and Services Expected Discharge Plan: Home/Self Care   Discharge Planning Services: CM Consult   Living arrangements for the past 2 months: Apartment Expected Discharge Date: 04/20/21                                     Social Determinants of Health (SDOH) Interventions    Readmission Risk Interventions Readmission Risk Prevention Plan 07/02/2019 06/25/2019 01/29/2019  Transportation Screening Complete Complete Complete  PCP or Specialist Appt within 3-5 Days - - Complete  Not Complete comments - - -  HRI or [place] - - Complete  Social Work Consult for [place] Planning/Counseling - - Complete  Palliative Care Screening - - Not Applicable  Medication Review Press photographer) Complete Complete Complete  PCP or Specialist appointment within 3-5 days of discharge - Complete -  [place] or Home Care Consult Complete - -  SW Recovery Care/Counseling Consult Complete Complete -  Palliative Care Screening Not Applicable Not Applicable -  [place] Not Applicable Not Applicable -  Some recent data might be hidden

## 2021-04-20 NOTE — TOC Progression Note (Signed)
Transition of Care [facility]) - Progression Note    Patient Details  Name: Deborah [name] MRN: [id number] Date of Birth: 04-16-1965  Transition of Care [facility]) CM/SW Contact  Purcell Mouton, RN Phone Number: 04/20/2021, 9:35 AM  Clinical Narrative:     TOC will continue to follow for discharge needs.   Expected Discharge Plan: Home/Self Care Barriers to Discharge: Continued Medical Work up  Expected Discharge Plan and Services Expected Discharge Plan: Home/Self Care   Discharge Planning Services: CM Consult   Living arrangements for the past 2 months: Apartment                                       Social Determinants of Health (SDOH) Interventions    Readmission Risk Interventions Readmission Risk Prevention Plan 07/02/2019 06/25/2019 01/29/2019  Transportation Screening Complete Complete Complete  PCP or Specialist Appt within 3-5 Days - - Complete  Not Complete comments - - -  [place]RI or [place] - - Complete  Social Work Consult for [place] Planning/Counseling - - Complete  Palliative Care Screening - - Not Applicable  Medication Review Press photographer) Complete Complete Complete  PCP or Specialist appointment within 3-5 days of discharge - Complete -  [place] or Home Care Consult Complete - -  SW Recovery Care/Counseling Consult Complete Complete -  Palliative Care Screening Not Applicable Not Applicable -  [place] Not Applicable Not Applicable -  Some recent data might be hidden

## 2021-04-20 NOTE — Discharge Summary (Incomplete)
Physician Discharge Summary  Deborah [name] [id number] DOB: November 20, 1965 DOA: 04/13/2021  PCP: Cena Benton, MD  Admit date: 04/13/2021 Discharge date: 04/20/2021  Discharge disposition: ***   Recommendations for Outpatient Follow-Up:   *** (include homehealth, outpatient follow-up instructions, specific recommendations for PCP to follow-up on, etc.) LACE score = ***, corresponding to a *** % of re-admission within 30 days.   (http://tools.farmacologiaclinica.info/index.php?sid=[email or web address])   Discharge Diagnosis:   Principal Problem:   Intractable nausea and vomiting Active Problems:   Type 2 diabetes mellitus with hyperglycemia, with long-term current use of insulin (HCC)   Chronic kidney disease, stage 3b (HCC)   Hypokalemia   Diabetic gastroparesis (HCC)   Chronic systolic CHF (congestive heart failure) (HCC)   PAF (paroxysmal atrial fibrillation) ([place])    Discharge Condition: Stable.  Diet recommendation:  Diet Order             Diet - low sodium heart healthy           DIET SOFT Room service appropriate? Yes; Fluid consistency: Thin  Diet effective now                     Code Status: Full Code     Hospital Course:   ***    Medical Consultants:   ***   Discharge Exam:    Vitals:   04/19/21 1550 04/19/21 2010 04/20/21 0500 04/20/21 0600  BP: 135/80 123/63  138/90  Pulse:  93  94  Resp:  20  19  Temp:  98.2 F (36.8 C)  98.4 F (36.9 C)  TempSrc:  Oral  Axillary  SpO2:  100%    Weight:   126.9 kg   Height:         GEN: NAD SKIN: No rash*** EYES: EOMI*** ENT: MMM CV: RRR PULM: CTA B ABD: soft, ND, NT, +BS CNS: AAO x 3, non focal EXT: No edema or tenderness***   The results of significant diagnostics from this hospitalization (including imaging, microbiology, ancillary and laboratory) are listed below for reference.     Procedures and Diagnostic Studies:   CT Abdomen Pelvis Wo Contrast  Result Date:  04/13/2021 CLINICAL DATA:  56 year old female with history of generalized abdominal pain. EXAM: CT ABDOMEN AND PELVIS WITHOUT CONTRAST TECHNIQUE: Multidetector CT imaging of the abdomen and pelvis was performed following the standard protocol without IV contrast. RADIATION DOSE REDUCTION: This exam was performed according to the departmental dose-optimization program which includes automated exposure control, adjustment of the mA and/or kV according to patient size and/or use of iterative reconstruction technique. COMPARISON:  CT the abdomen and pelvis 01/31/2021. FINDINGS: Lower chest: Unremarkable. Hepatobiliary: No definite suspicious cystic or solid hepatic lesions confidently identified on today's noncontrast CT examination. Status post cholecystectomy. Pancreas: No definite pancreatic mass or peripancreatic fluid collections or inflammatory changes are noted on today's noncontrast CT examination. Spleen: Unremarkable. Adrenals/Urinary Tract: There are no abnormal calcifications within the collecting system of either kidney, along the course of either ureter, or within the lumen of the urinary bladder. No hydroureteronephrosis or perinephric stranding to suggest urinary tract obstruction at this time. The unenhanced appearance of the kidneys is unremarkable bilaterally. Unenhanced appearance of the urinary bladder is normal. Bilateral adrenal glands are normal in appearance. Stomach/Bowel: Several metallic densities are noted in the antral pre-pyloric region of the stomach, likely biopsy clips. Unenhanced appearance of the stomach is otherwise normal. No pathologic dilatation of small bowel or colon. Normal appendix. Vascular/Lymphatic: Aortic atherosclerosis. No  lymphadenopathy noted in the abdomen or pelvis. Reproductive: Exophytic lesion in the left side of the uterine fundus measuring 4.9 cm in diameter, similar to the prior examination, likely to represent an exophytic fibroid subserosal. This obscures the  left ovary. Right ovary is unremarkable in appearance. Other: No significant volume of ascites.  No pneumoperitoneum. Musculoskeletal: There are no aggressive appearing lytic or blastic lesions noted in the visualized portions of the skeleton. IMPRESSION: 1. No acute findings are noted in the abdomen or pelvis to account for the patient's symptoms. 2. Similar exophytic lesion associated with the left side of the uterine fundus, favored to represent an exophytic fibroid, less likely a left adnexal lesion. This could be better evaluated with follow-up nonemergent pelvic ultrasound if of clinical concern. 3. Aortic atherosclerosis. 4. Additional incidental findings, as above. Electronically Signed   By: Vinnie Langton M.D.   On: 04/13/2021 10:55     Labs:   Basic Metabolic Panel: Recent Labs  Lab 04/16/21 0338 04/17/21 0717 04/18/21 0649 04/19/21 0816 04/20/21 0706  NA 137 141 141 140 142  K 3.7 4.5 4.1 3.9 4.3  CL 112* 115* 116* 117* 121*  CO2 16* 14* 17* 16* 16*  GLUCOSE 157* 164* 156* 173* 157*  BUN 51* 50* 51* 45* 36*  CREATININE 2.25* 2.00* 2.10* 2.01* 1.84*  CALCIUM 9.2 8.9 9.1 8.7* 8.9  MG 2.3 2.0 2.1 1.8 1.5*   GFR Estimated Creatinine Clearance: 46 mL/min (A) (by C-G formula based on SCr of 1.84 mg/dL (H)). Liver Function Tests: Recent Labs  Lab 04/14/21 0419  AST 20  ALT 22  ALKPHOS 80  BILITOT 0.4  PROT 7.9  ALBUMIN 3.4*   No results for input(s): LIPASE, AMYLASE in the last 168 hours. Recent Labs  Lab 04/16/21 1734  AMMONIA <10   Coagulation profile No results for input(s): INR, PROTIME in the last 168 hours.  CBC: Recent Labs  Lab 04/16/21 0338 04/17/21 0717 04/17/21 2016 04/18/21 0649 04/19/21 0816 04/20/21 0943  WBC 12.6* 13.6*  --  9.9 10.0 11.8*  NEUTROABS 9.8* 10.9*  --  6.7 7.0 PENDING  HGB 7.1* 6.4* 7.2* 6.4* 7.0* 7.5*  HCT 23.6* 21.0* 23.3* 21.5* 23.7* 24.7*  MCV 93.7 92.5  --  95.6 96.3 94.3  PLT 361 354  --  384 374 419*   Cardiac  Enzymes: Recent Labs  Lab 04/16/21 1454  CKTOTAL 97   BNP: Invalid input(s): POCBNP CBG: Recent Labs  Lab 04/19/21 0749 04/19/21 1150 04/19/21 1704 04/19/21 2008 04/20/21 0754  GLUCAP 155* 134* 225* 195* 163*   D-Dimer No results for input(s): DDIMER in the last 72 hours. Hgb A1c No results for input(s): HGBA1C in the last 72 hours. Lipid Profile No results for input(s): CHOL, HDL, LDLCALC, TRIG, CHOLHDL, LDLDIRECT in the last 72 hours. Thyroid function studies No results for input(s): TSH, T4TOTAL, T3FREE, THYROIDAB in the last 72 hours.  Invalid input(s): FREET3 Anemia work up No results for input(s): VITAMINB12, FOLATE, FERRITIN, TIBC, IRON, RETICCTPCT in the last 72 hours. Microbiology Recent Results (from the past 240 hour(s))  Resp Panel by RT-PCR (Flu A&B, Covid) Nasopharyngeal Swab     Status: None   Collection Time: 04/13/21  9:10 AM   Specimen: Nasopharyngeal Swab; Nasopharyngeal(NP) swabs in vial transport medium  Result Value Ref Range Status   SARS Coronavirus 2 by RT PCR NEGATIVE NEGATIVE Final    Comment: (NOTE) SARS-CoV-2 target nucleic acids are NOT DETECTED.  The SARS-CoV-2 RNA is generally detectable in  upper respiratory specimens during the acute phase of infection. The lowest concentration of SARS-CoV-2 viral copies this assay can detect is 138 copies/mL. A negative result does not preclude SARS-Cov-2 infection and should not be used as the sole basis for treatment or other patient management decisions. A negative result may occur with  improper specimen collection/handling, submission of specimen other than nasopharyngeal swab, presence of viral mutation(s) within the areas targeted by this assay, and inadequate number of viral copies(<138 copies/mL). A negative result must be combined with clinical observations, patient history, and epidemiological information. The expected result is Negative.  Fact Sheet for Patients:   EntrepreneurPulse.com.au  Fact Sheet for Healthcare Providers:  IncredibleEmployment.be  This test is no t yet approved or cleared by the [place] FDA and  has been authorized for detection and/or diagnosis of SARS-CoV-2 by FDA under an Emergency Use Authorization (EUA). This EUA will remain  in effect (meaning this test can be used) for the duration of the COVID-19 declaration under Section 564(b)(1) of the Act, 21 U.S.C.section 360bbb-3(b)(1), unless the authorization is terminated  or revoked sooner.       Influenza A by PCR NEGATIVE NEGATIVE Final   Influenza B by PCR NEGATIVE NEGATIVE Final    Comment: (NOTE) The Xpert Xpress SARS-CoV-2/FLU/RSV plus assay is intended as an aid in the diagnosis of influenza from Nasopharyngeal swab specimens and should not be used as a sole basis for treatment. Nasal washings and aspirates are unacceptable for Xpert Xpress SARS-CoV-2/FLU/RSV testing.  Fact Sheet for Patients: EntrepreneurPulse.com.au  Fact Sheet for Healthcare Providers: IncredibleEmployment.be  This test is not yet approved or cleared by the [place] FDA and has been authorized for detection and/or diagnosis of SARS-CoV-2 by FDA under an Emergency Use Authorization (EUA). This EUA will remain in effect (meaning this test can be used) for the duration of the COVID-19 declaration under Section 564(b)(1) of the Act, 21 U.S.C. section 360bbb-3(b)(1), unless the authorization is terminated or revoked.  Performed at [facility], [place] [address] [place], [place] [address]      Discharge Instructions:   Discharge Instructions     Diet - low sodium heart healthy   Complete by: As directed    Increase activity slowly   Complete by: As directed       Allergies as of 04/20/2021       Reactions   Diazepam [name]   Gabapentin [name], Swelling    Other reaction(s): Unknown   Iodinated Contrast Media Anaphylaxis   11/29/17 Cardiac arrest 1 min after IV contrast, possible allergy vs vasovagal episode Iopamidol  Anaphylaxis  High 11/28/2017  Patient had seizure like activity and then code post 100 cc of isovue 300     Isovue [iopamidol] Anaphylaxis   11/28/17 Patient had seizure like activity and then 1 min code after 100 cc of isovue 300. Possible contrast allergy vs vasovagal episode   Lisinopril Anaphylaxis   Tongue and mouth swelling   Metoclopramide Other (See Comments)   Tardive dyskinesia   Nsaids Anaphylaxis, Other (See Comments)   ULCER   Penicillins Palpitations   Has patient had a PCN reaction causing immediate rash, facial/tongue/throat swelling, SOB or lightheadedness with hypotension: Yes, heart races Has patient had a PCN reaction causing severe rash involving mucus membranes or skin necrosis: No Has patient had a PCN reaction that required hospitalization: Yes  Has patient had a PCN reaction occurring within the last 10 years: No   Tolmetin  Nausea Only, Other (See Comments)   ULCER   Cyclobenzaprine    Other reaction(s): Unknown   Rifamycins Other (See Comments)   unknown   Acetaminophen Nausea Only, Other (See Comments)   Irritates stomach ulcer; Abdominal pain   Oxycodone Palpitations   Tramadol Nausea And Vomiting        Medication List     STOP taking these medications    HYDROmorphone 4 MG tablet Commonly known as: Dilaudid   NAC 600 MG Caps Generic drug: Acetylcysteine   nitroGLYCERIN 0.4 MG SL tablet Commonly known as: NITROSTAT       TAKE these medications    albuterol (2.5 MG/3ML) 0.083% nebulizer solution Commonly known as: PROVENTIL Take 3 mLs (2.5 mg total) by nebulization every 6 (six) hours as needed for wheezing or shortness of breath.   atorvastatin 10 MG tablet Commonly known as: LIPITOR TAKE ONE TABLET BY MOUTH ONCE DAILY (AM) What changed: See the new instructions.    baclofen 10 MG tablet Commonly known as: LIORESAL Take 1 tablet (10 mg total) by mouth 3 (three) times daily.   blood glucose meter kit and supplies Kit Dispense based on patient and insurance preference. Use up to four times daily as directed. (FOR ICD-9 250.00, 250.01).   cetirizine 10 MG tablet Commonly known as: ZYRTEC Take 1 tablet (10 mg total) by mouth daily.   DULoxetine HCl 40 MG Cpep Take 40 mg by mouth in the morning and at bedtime.   Easy Comfort Pen Needles 31G X 5 MM Misc Generic drug: Insulin Pen Needle USE 3 TIMES A DAY FOR INSULIN ADMINISTRATION   Eliquis 5 MG Tabs tablet Generic drug: apixaban TAKE 1 TABLET BY MOUTH 2 (TWO) TIMES DAILY. What changed: how much to take   famotidine 40 MG tablet Commonly known as: PEPCID Take 40 mg by mouth daily.   fluticasone 50 MCG/ACT nasal spray Commonly known as: FLONASE Place 2 sprays into both nostrils daily as needed for allergies or rhinitis.   folic acid 1 MG tablet Commonly known as: FOLVITE Take 1 mg by mouth daily.   hydrALAZINE 50 MG tablet Commonly known as: APRESOLINE Take 1 tablet (50 mg total) by mouth 3 (three) times daily.   isosorbide mononitrate 60 MG 24 hr tablet Commonly known as: IMDUR TAKE 1 TABLET (60 MG TOTAL) BY MOUTH DAILY (AM) What changed: See the new instructions.   Lantus SoloStar 100 UNIT/ML Solostar Pen Generic drug: insulin glargine Inject 20 Units into the skin at bedtime. What changed: how much to take   meclizine 25 MG tablet Commonly known as: ANTIVERT Take 1 tablet (25 mg total) by mouth 3 (three) times daily as needed for dizziness.   metoCLOPramide 5 MG tablet Commonly known as: REGLAN TAKE 1 TABLET (5 MG TOTAL) BY MOUTH 4 TIMES DAILY. What changed: See the new instructions.   metoprolol succinate 50 MG 24 hr tablet Commonly known as: TOPROL-XL Take 1 tablet (50 mg total) by mouth daily. Take with or immediately following a meal.   NovoLOG FlexPen 100 UNIT/ML  FlexPen Generic drug: insulin aspart Inject 10 Units into the skin 3 (three) times daily with meals. What changed:  how much to take when to take this   ondansetron 4 MG disintegrating tablet Commonly known as: Zofran ODT Take 1 tablet (4 mg total) by mouth every 8 (eight) hours as needed for nausea or vomiting.   pantoprazole 40 MG tablet Commonly known as: PROTONIX TAKE 1 TABLET (40 MG TOTAL) BY  MOUTH 2 (TWO) TIMES DAILY. (AM+BEDTIME) What changed: See the new instructions.   sodium bicarbonate 650 MG tablet Take 1 tablet (650 mg total) by mouth 2 (two) times daily.   spironolactone 25 MG tablet Commonly known as: ALDACTONE Take 12.5 mg by mouth daily.   tiZANidine 4 MG tablet Commonly known as: ZANAFLEX Take 4 mg by mouth 3 (three) times daily as needed for muscle spasms.   Vitamin D (Ergocalciferol) 1.25 MG (50000 UNIT) Caps capsule Commonly known as: DRISDOL Take 50,000 Units by mouth once a week.           If you experience worsening of your admission symptoms, develop shortness of breath, life threatening emergency, suicidal or homicidal thoughts you must seek medical attention immediately by calling 911 or calling your MD immediately  if symptoms less severe.   You must read complete instructions/literature along with all the possible adverse reactions/side effects for all the medicines you take and that have been prescribed to you. Take any new medicines after you have completely understood and accept all the possible adverse reactions/side effects.    Please note   You were cared for by a hospitalist during your hospital stay. If you have any questions about your discharge medications or the care you received while you were in the hospital after you are discharged, you can call the unit and asked to speak with the hospitalist on call if the hospitalist that took care of you is not available. Once you are discharged, your primary care physician will handle any  further medical issues. Please note that NO REFILLS for any discharge medications will be authorized once you are discharged, as it is imperative that you return to your primary care physician (or establish a relationship with a primary care physician if you do not have one) for your aftercare needs so that they can reassess your need for medications and monitor your lab values.       Time coordinating discharge: ***  Signed:  Drago Hammonds  [facility] 04/20/2021, 10:26 AM   Pager on [email or web address]. If 7PM-7AM, please contact night-coverage at [email or web address]

## 2021-04-20 NOTE — Progress Notes (Signed)
Progress Note    Deborah [name]  ZWC:[id number] DOB: 10-19-1965  DOA: 04/13/2021 PCP: Cena Benton, MD      Brief Narrative:    Medical records reviewed and are as summarized below:  Deborah [name] is a 56 y.o. female with medical history significant for recent pyloromyotomy on 03/27/2021 in [place] for diabetic gastroparesis, recurrent hospitalization for chronic abdominal pain, chronic pain, stroke, DM2, HFrEF, A fib on eliquis, CKD3b.  She presented to the hospital because of nausea, vomiting and abdominal pain.     Assessment/Plan:   Principal Problem:   Intractable nausea and vomiting Active Problems:   Type 2 diabetes mellitus with hyperglycemia, with long-term current use of insulin (HCC)   Chronic kidney disease, stage 3b (HCC)   Hypokalemia   Diabetic gastroparesis (HCC)   Chronic systolic CHF (congestive heart failure) (HCC)   PAF (paroxysmal atrial fibrillation) (HCC)    Body mass index is 45.16 kg/m.  (Morbid obesity)  Diabetic gastroparesis, nausea, vomiting, abdominal pain, recent pyloromyotomy on 03/27/2021: She moved her bowels on 04/17/2021.  Continue soft diet.  Continue analgesics as needed for pain  AKI on CKD stage IIIb, metabolic acidosis: Creatinine is stable.  Continue sodium bicarbonate for metabolic acidosis  Acute on chronic anemia, anemia of chronic disease, iron deficiency anemia: H&H is stable.  S/p transfusion with 1 unit of PRBCs on 04/18/2021.  S/p IV iron infusion on 2/12 and 2/13.    Chronic systolic CHF and diastolic: Compensated.  Aldactone on hold.  2D echo in September 2020 showed EF estimated at 50 to 55% (there is an improvement in EF from 40 to 45% in November 2020)  Paroxysmal atrial fibrillation: Continue metoprolol and Eliquis  Opioid use disorder with suspected opioid withdrawal, intermittent agitation: She remains on Dilaudid for pain.  Follow-up with psychiatrist  Type II DM: Continue glargine  and use NovoLog as needed for hyperglycemia  Patient was discharged today.  However, family is concerned that patient is not able to care for herself at home.  PT and OT to reevaluate patient to assist with disposition plans.  Follow-up with case manager.   Diet Order             Diet - low sodium heart healthy           DIET SOFT Room service appropriate? Yes; Fluid consistency: Thin  Diet effective now                      Consultants: Psychiatrist  Procedures: None    Medications:    atorvastatin  10 mg Oral Daily   baclofen  10 mg Oral TID   bisacodyl  10 mg Rectal Daily   chlorhexidine  15 mL Mouth Rinse BID   folic acid  1 mg Oral Daily   hydrALAZINE  50 mg Oral TID   insulin aspart  0-20 Units Subcutaneous TID WC   insulin aspart  0-5 Units Subcutaneous QHS   insulin glargine-yfgn  20 Units Subcutaneous QHS   isosorbide mononitrate  60 mg Oral Daily   lactulose  20 g Oral BID   linaclotide  145 mcg Oral QAC breakfast   mouth rinse  15 mL Mouth Rinse q12n4p   metoprolol succinate  50 mg Oral Daily   polyethylene glycol  17 g Oral BID   senna-docusate  2 tablet Oral BID   sodium bicarbonate  650 mg Oral TID   Continuous Infusions:  Anti-infectives (From admission, onward)    None              Family Communication/Anticipated D/C date and plan/Code Status   DVT prophylaxis:      Code Status: Full Code  Family Communication: None Disposition Plan: Plan to discharge home in 1 to 2 days   Status is: Inpatient Remains inpatient appropriate because: Unsafe discharge               Subjective:   Interval events noted.  She has no complaints.  Objective:    Vitals:   04/19/21 1550 04/19/21 2010 04/20/21 0500 04/20/21 0600  BP: 135/80 123/63  138/90  Pulse:  93  94  Resp:  20  19  Temp:  98.2 F (36.8 C)  98.4 F (36.9 C)  TempSrc:  Oral  Axillary  SpO2:  100%    Weight:   126.9 kg   Height:       No  data found.   Intake/Output Summary (Last 24 hours) at 04/20/2021 1534 Last data filed at 04/19/2021 1700 Gross per 24 hour  Intake 111.65 ml  Output --  Net 111.65 ml   Filed Weights   04/17/21 0500 04/18/21 0500 04/20/21 0500  Weight: 126.2 kg 126.6 kg 126.9 kg    Exam:  GEN: NAD SKIN: No rash EYES: EOMI ENT: MMM CV: RRR PULM: CTA B ABD: soft, obese, NT, +BS CNS: AAO x 3, non focal EXT: No edema or tenderness           Data Reviewed:   I have personally reviewed following labs and imaging studies:  Labs: Labs show the following:   Basic Metabolic Panel: Recent Labs  Lab 04/16/21 0338 04/17/21 0717 04/18/21 0649 04/19/21 0816 04/20/21 0706  NA 137 141 141 140 142  K 3.7 4.5 4.1 3.9 4.3  CL 112* 115* 116* 117* 121*  CO2 16* 14* 17* 16* 16*  GLUCOSE 157* 164* 156* 173* 157*  BUN 51* 50* 51* 45* 36*  CREATININE 2.25* 2.00* 2.10* 2.01* 1.84*  CALCIUM 9.2 8.9 9.1 8.7* 8.9  MG 2.3 2.0 2.1 1.8 1.5*   GFR Estimated Creatinine Clearance: 46 mL/min (A) (by C-G formula based on SCr of 1.84 mg/dL (H)). Liver Function Tests: Recent Labs  Lab 04/14/21 0419  AST 20  ALT 22  ALKPHOS 80  BILITOT 0.4  PROT 7.9  ALBUMIN 3.4*   No results for input(s): LIPASE, AMYLASE in the last 168 hours.  Recent Labs  Lab 04/16/21 1734  AMMONIA <10   Coagulation profile No results for input(s): INR, PROTIME in the last 168 hours.  CBC: Recent Labs  Lab 04/16/21 0338 04/17/21 0717 04/17/21 2016 04/18/21 0649 04/19/21 0816 04/20/21 0943  WBC 12.6* 13.6*  --  9.9 10.0 11.8*  NEUTROABS 9.8* 10.9*  --  6.7 7.0 9.2*  HGB 7.1* 6.4* 7.2* 6.4* 7.0* 7.5*  HCT 23.6* 21.0* 23.3* 21.5* 23.7* 24.7*  MCV 93.7 92.5  --  95.6 96.3 94.3  PLT 361 354  --  384 374 419*   Cardiac Enzymes: Recent Labs  Lab 04/16/21 1454  CKTOTAL 97   BNP (last 3 results) No results for input(s): PROBNP in the last 8760 hours. CBG: Recent Labs  Lab 04/19/21 1150 04/19/21 1704  04/19/21 2008 04/20/21 0754 04/20/21 1256  GLUCAP 134* 225* 195* 163* 187*   D-Dimer: No results for input(s): DDIMER in the last 72 hours. Hgb A1c: No results for input(s): HGBA1C in the last 72  hours. Lipid Profile: No results for input(s): CHOL, HDL, LDLCALC, TRIG, CHOLHDL, LDLDIRECT in the last 72 hours. Thyroid function studies: No results for input(s): TSH, T4TOTAL, T3FREE, THYROIDAB in the last 72 hours.  Invalid input(s): FREET3  Anemia work up: No results for input(s): VITAMINB12, FOLATE, FERRITIN, TIBC, IRON, RETICCTPCT in the last 72 hours. Sepsis Labs: Recent Labs  Lab 04/16/21 1459 04/16/21 1734 04/17/21 0717 04/18/21 0649 04/19/21 0816 04/20/21 0943  WBC  --   --  13.6* 9.9 10.0 11.8*  LATICACIDVEN 1.0 1.1  --   --   --   --     Microbiology Recent Results (from the past 240 hour(s))  Resp Panel by RT-PCR (Flu A&B, Covid) Nasopharyngeal Swab     Status: None   Collection Time: 04/13/21  9:10 AM   Specimen: Nasopharyngeal Swab; Nasopharyngeal(NP) swabs in vial transport medium  Result Value Ref Range Status   SARS Coronavirus 2 by RT PCR NEGATIVE NEGATIVE Final    Comment: (NOTE) SARS-CoV-2 target nucleic acids are NOT DETECTED.  The SARS-CoV-2 RNA is generally detectable in upper respiratory specimens during the acute phase of infection. The lowest concentration of SARS-CoV-2 viral copies this assay can detect is 138 copies/mL. A negative result does not preclude SARS-Cov-2 infection and should not be used as the sole basis for treatment or other patient management decisions. A negative result may occur with  improper specimen collection/handling, submission of specimen other than nasopharyngeal swab, presence of viral mutation(s) within the areas targeted by this assay, and inadequate number of viral copies(<138 copies/mL). A negative result must be combined with clinical observations, patient history, and epidemiological information. The expected  result is Negative.  Fact Sheet for Patients:  EntrepreneurPulse.com.au  Fact Sheet for Healthcare Providers:  IncredibleEmployment.be  This test is no t yet approved or cleared by the [place] FDA and  has been authorized for detection and/or diagnosis of SARS-CoV-2 by FDA under an Emergency Use Authorization (EUA). This EUA will remain  in effect (meaning this test can be used) for the duration of the COVID-19 declaration under Section 564(b)(1) of the Act, 21 U.S.C.section 360bbb-3(b)(1), unless the authorization is terminated  or revoked sooner.       Influenza A by PCR NEGATIVE NEGATIVE Final   Influenza B by PCR NEGATIVE NEGATIVE Final    Comment: (NOTE) The Xpert Xpress SARS-CoV-2/FLU/RSV plus assay is intended as an aid in the diagnosis of influenza from Nasopharyngeal swab specimens and should not be used as a sole basis for treatment. Nasal washings and aspirates are unacceptable for Xpert Xpress SARS-CoV-2/FLU/RSV testing.  Fact Sheet for Patients: EntrepreneurPulse.com.au  Fact Sheet for Healthcare Providers: IncredibleEmployment.be  This test is not yet approved or cleared by the [place] FDA and has been authorized for detection and/or diagnosis of SARS-CoV-2 by FDA under an Emergency Use Authorization (EUA). This EUA will remain in effect (meaning this test can be used) for the duration of the COVID-19 declaration under Section 564(b)(1) of the Act, 21 U.S.C. section 360bbb-3(b)(1), unless the authorization is terminated or revoked.  Performed at [facility], [place] [address] [place], [place] [address]     Procedures and diagnostic studies:  No results found.             LOS: 7 days   Kadence Mikkelson  [facility]   Pager on [email or web address]. If 7PM-7AM, please contact night-coverage at [email or web address]     04/20/2021, 3:34 PM

## 2021-04-20 NOTE — Progress Notes (Addendum)
Physical Therapy Treatment Patient Details Name: Deborah [name] MRN: [id number] DOB: Dec 06, 1965 Today's Date: 04/20/2021   History of Present Illness Deborah [name] is a 56 y.o. female presents with nausea and vomiting and abdominal discomfort. CT abdomen pelvis without evidence of significant acute pathology. Of note, pyloromyotomy procedure on 03/27/21 in [place] for DM gastroparesis. PMH: afib, diabetes, CHF, CVA, CKD, fibromyalgia, HTN    PT Comments    General Comments: difficult to access. Pt is groggy/dazzy with intermittent bouts of crying then falling asleep even when sitting on EOB. General bed mobility comments: pt can roll side to side to reposition partially but was unable to scoot and unable to sit to EOB on her own.  Pt required + 2 Total Assist pt <10% assisted.  Very groggt/dazzed.  Called for extra help to get pt back to bed.  Pt was resistant, emotional then slumping over back to sleep.  General transfer comment: attempted to get pt to transfer from EOB to [facility] but was unsuccessfull.  Pt sat EOB x16 min repeating "wait", dazzed, unable to focus, unable to follow commands.  Awake but "drunk" .  Pt was given 1mg  IV Dilaudid just prior.  Was asked to re evaluate pt for possible need for SNF however I was unable to assess her mobility.   Pt is unable to safely D/C to home today.  Will ask LPT to see her tomorrow.    Recommendations for follow up therapy are one component of a multi-disciplinary discharge planning process, led by the attending physician.  Recommendations may be updated based on patient status, additional functional criteria and insurance authorization.  Follow Up Recommendations  Home health PT     Assistance Recommended at Discharge Frequent or constant Supervision/Assistance  Patient can return home with the following A little help with walking and/or transfers;A little help with bathing/dressing/bathroom;Assistance with cooking/housework;Assist for  transportation   Equipment Recommendations  BSC/3in1    Recommendations for Other Services       Precautions / Restrictions Precautions Precautions: Fall Restrictions Weight Bearing Restrictions: No     Mobility  Bed Mobility Overal bed mobility: Needs Assistance Bed Mobility: Supine to Sit Rolling: Total assist, +2 for physical assistance, +2 for safety/equipment   Supine to sit: Total assist, +2 for physical assistance, +2 for safety/equipment     General bed mobility comments: pt can roll side to side to reposition partially but was unable to scoot and unable to sit to EOB on her own.  Pt required + 2 Total Assist pt <10% assisted.  Very groggt/dazzed.    Transfers                   General transfer comment: attempted to get pt to transfer from EOB to [facility] but was unsuccessfull.  Pt sat EOB x16 min repeating "wait", dazzed, unable to focus, unable to follow commands.  Awake but "drunk" .  Pt was given 1mg  IV Dilaudid just prior.    Ambulation/Gait                   Stairs             Wheelchair Mobility    Modified Rankin (Stroke Patients Only)       Balance  Cognition Arousal/Alertness: Lethargic Behavior During Therapy: Restless Overall Cognitive Status: Impaired/Different from baseline                                 General Comments: difficult to access. Pt is groggy/dazzy with intermittent bouts of crying then falling asleep even when sitting on EOB.        Exercises      General Comments        Pertinent Vitals/Pain Pain Assessment Pain Assessment: Faces Pain Location: B feet Pain Descriptors / Indicators: Crying, Aching Pain Intervention(s): Monitored during session, Repositioned    Home Living                          Prior Function            PT Goals (current goals can now be found in the care plan section) Progress  towards PT goals: Progressing toward goals    Frequency    Min 2X/week      PT Plan Current plan remains appropriate    Co-evaluation              AM-PAC PT "6 Clicks" Mobility   Outcome Measure  Help needed turning from your back to your side while in a flat bed without using bedrails?: Total Help needed moving from lying on your back to sitting on the side of a flat bed without using bedrails?: Total Help needed moving to and from a bed to a chair (including a wheelchair)?: Total Help needed standing up from a chair using your arms (e.g., wheelchair or bedside chair)?: Total Help needed to walk in hospital room?: Total Help needed climbing 3-5 steps with a railing? : Total 6 Click Score: 6    End of Session Equipment Utilized During Treatment: Gait belt Activity Tolerance: Other (comment) Patient left: in bed Nurse Communication: Mobility status (RN in room at end of session asssiting with getting pt back to bed.) PT Visit Diagnosis: Muscle weakness (generalized) (M62.81);Pain     Time: 8416-6063 PT Time Calculation (min) (ACUTE ONLY): 48 min  Charges:  $Therapeutic Activity: 38-52 mins                     Rica Koyanagi  PTA Acute  Rehabilitation Services Pager      [phone] Office      [phone]

## 2021-04-20 NOTE — Progress Notes (Signed)
Attempted to call patient's spouse and mother. No response at this time.  Layla Maw, RN

## 2021-04-20 NOTE — TOC Progression Note (Signed)
Transition of Care [facility]) - Progression Note    Patient Details  Name: Deborah [name] MRN: [id number] Date of Birth: 01-Feb-1966  Transition of Care [facility]) CM/SW Contact  Purcell Mouton, RN Phone Number: 04/20/2021, 12:15 PM  Clinical Narrative:     A call was made to pt's mother and spouse with no answer. Pt was setup with [facility] for Home Health. Left VM for [name].   Expected Discharge Plan: Home/Self Care Barriers to Discharge: Continued Medical Work up  Expected Discharge Plan and Services Expected Discharge Plan: Home/Self Care   Discharge Planning Services: CM Consult   Living arrangements for the past 2 months: Apartment Expected Discharge Date: 04/20/21                                     Social Determinants of Health (SDOH) Interventions    Readmission Risk Interventions Readmission Risk Prevention Plan 07/02/2019 06/25/2019 01/29/2019  Transportation Screening Complete Complete Complete  PCP or Specialist Appt within 3-5 Days - - Complete  Not Complete comments - - -  [place] or [place] - - Complete  Social Work Consult for [place] Planning/Counseling - - Complete  Palliative Care Screening - - Not Applicable  Medication Review Press photographer) Complete Complete Complete  PCP or Specialist appointment within 3-5 days of discharge - Complete -  [place] or Home Care Consult Complete - -  SW Recovery Care/Counseling Consult Complete Complete -  Palliative Care Screening Not Applicable Not Applicable -  [place] Not Applicable Not Applicable -  Some recent data might be hidden

## 2021-04-21 LAB — GLUCOSE, CAPILLARY
Glucose-Capillary: 158 mg/dL — ABNORMAL HIGH (ref 70–99)
Glucose-Capillary: 287 mg/dL — ABNORMAL HIGH (ref 70–99)
Glucose-Capillary: 291 mg/dL — ABNORMAL HIGH (ref 70–99)

## 2021-04-21 LAB — MAGNESIUM: Magnesium: 1.4 mg/dL — ABNORMAL LOW (ref 1.7–2.4)

## 2021-04-21 MED ORDER — MAGNESIUM SULFATE 2 GM/50ML IV SOLN
2.0000 g | Freq: Once | INTRAVENOUS | Status: DC
  Filled 2021-04-21: qty 50

## 2021-04-21 MED ORDER — MAGNESIUM SULFATE 4 GM/100ML IV SOLN
4.0000 g | Freq: Once | INTRAVENOUS | Status: AC
  Administered 2021-04-21: 4 g via INTRAVENOUS
  Filled 2021-04-21: qty 100

## 2021-04-21 MED ORDER — HALOPERIDOL LACTATE 5 MG/ML IJ SOLN
2.0000 mg | Freq: Four times a day (QID) | INTRAMUSCULAR | Status: DC | PRN
  Administered 2021-04-21: 2 mg via INTRAVENOUS
  Filled 2021-04-21: qty 1

## 2021-04-21 MED ORDER — INSULIN ASPART 100 UNIT/ML IJ SOLN: 3.0000 [IU] | Freq: Three times a day (TID) | INTRAMUSCULAR | Status: DC

## 2021-04-21 NOTE — Progress Notes (Addendum)
Inpatient Diabetes Program Recommendations  AACE/ADA: New Consensus Statement on Inpatient Glycemic Control   Target Ranges:  Prepandial:   less than 140 mg/dL      Peak postprandial:   less than 180 mg/dL (1-2 hours)      Critically ill patients:  140 - 180 mg/dL    Latest Reference Range & Units 04/21/21 07:38  Glucose-Capillary 70 - 99 mg/dL 287 (H)  \  Latest Reference Range & Units 04/20/21 07:54 04/20/21 12:56 04/20/21 16:47 04/20/21 22:35  Glucose-Capillary 70 - 99 mg/dL 163 (H) 187 (H) 179 (H) 158 (H)   Review of Glycemic Control  Diabetes history: DM2 Outpatient Diabetes medications: Lantus 50 units QHS, Novolog 30 units TID with meals Current orders for Inpatient glycemic control: Semglee 20 units QHS, Novolog 0-20 units TID with meals, Novolog 0-5 units QHS  Inpatient Diabetes Program Recommendations:    Insulin: Please consider ordering Novolog 3 units TID with meals for meal coverage if patient eats at least 50% of meals.  Thanks, Barnie Alderman, RN, MSN, CDE Diabetes Coordinator Inpatient Diabetes Program [phone] (Team Pager from 8am to 5pm)

## 2021-04-21 NOTE — Progress Notes (Signed)
OT Cancellation Note  Patient Details Name: Deborah [name] MRN: [id number] DOB: [birth year]   Cancelled Treatment:    Reason Eval/Treat Not Completed: Patient declined, no reason specified Patient calling out " I want to go home" prior to entrance. Upon seeing PT and OT, patient yelled "no" and reported she did not want to do therapy. Patient declined to participate in all tasks. Nurse made aware.   Jackelyn Poling OTR/L, [place] Acute Rehabilitation Department Office# [phone] Pager# [phone]  Feliz Beam Fash2/14/2023, 9:23 AM

## 2021-04-21 NOTE — Progress Notes (Signed)
Pt and husband given discharge instructions.  IV taken out.  Pt leaving with clothes and cell phone. Taken to front entrance in wheelchair. Husband to transport home.

## 2021-04-21 NOTE — Care Management Important Message (Signed)
Important Message  Patient Details IM Letter given to the Patient. Name: Deborah [name] MRN: [id number] Date of Birth: [birth year]   Medicare Important Message Given:  Yes     Kerin Salen 04/21/2021, 12:33 PM

## 2021-04-21 NOTE — Progress Notes (Signed)
PT Cancellation Note  Patient Details Name: Deborah [name] MRN: [id number] DOB: 03-26-1965   Cancelled Treatment:    Reason Eval/Treat Not Completed: Other (comment) (pt refuses therapy). Pt seated EOB without hospital gown or any covering, repeating "I want to go home". Pt declines therapists offering to assist pt in putting on hospital gown, begins yelling "no" and reports she doesn't want therapy when PT/OT therapists introduce self. Pt states she is in "[facility] hospital" and the date is "Wednesday". Pt declines to participate with therapists, RN notified.    Tori Braydan Marriott PT, DPT 04/21/21, 9:25 AM

## 2021-04-21 NOTE — TOC Progression Note (Signed)
Transition of Care [facility]) - Progression Note    Patient Details  Name: Deborah [name] MRN: [id number] Date of Birth: Mar 11, 1965  Transition of Care [facility]) CM/SW Contact  Purcell Mouton, RN Phone Number: 04/21/2021, 10:56 AM  Clinical Narrative:    Spoke with pt's husband Deborah [name] in person for a length of time this morning. Pt is asking to go home, refused PT this am. Husband Deborah [name] states that pt has PCS that comes and would like home health PT/OT.  Deborah [name] states, that pt can come home, she has support at home. Spoke with pt's PCS NA Deborah [name], who states that she will continue to assist pt.  Referral was given to [facility] in house rep.  Husband will transport pt home when medically stable.    Expected Discharge Plan: Home/Self Care Barriers to Discharge: Continued Medical Work up  Expected Discharge Plan and Services Expected Discharge Plan: Home/Self Care   Discharge Planning Services: CM Consult   Living arrangements for the past 2 months: Apartment Expected Discharge Date: 04/20/21                                     Social Determinants of Health (SDOH) Interventions    Readmission Risk Interventions Readmission Risk Prevention Plan 07/02/2019 06/25/2019 01/29/2019  Transportation Screening Complete Complete Complete  PCP or Specialist Appt within 3-5 Days - - Complete  Not Complete comments - - -  [place] or [place] - - Complete  Social Work Consult for [place] Planning/Counseling - - Complete  Palliative Care Screening - - Not Applicable  Medication Review Press photographer) Complete Complete Complete  PCP or Specialist appointment within 3-5 days of discharge - Complete -  [place] or Home Care Consult Complete - -  SW Recovery Care/Counseling Consult Complete Complete -  Palliative Care Screening Not Applicable Not Applicable -  [place] Not Applicable Not Applicable -  Some recent data might be hidden

## 2021-04-21 NOTE — Discharge Summary (Signed)
Physician Discharge Summary  JANEAN EISCHEN TWS:[id number] DOB: 28-Sep-1965 DOA: 04/13/2021  PCP: Cena Benton, MD  Admit date: 04/13/2021  Discharge date: 04/21/2021  Admitted From:Home  Disposition:  Home  Recommendations for Outpatient Follow-up:  Follow up with PCP in 1-2 weeks Continue medications as noted below  Home Health: Home PT/OT  Equipment/Devices: None  Discharge Condition:Stable  CODE STATUS: Full  Diet recommendation: Heart Healthy/carb modified  Brief/Interim Summary: ADRIJANA HAROS is a 56 y.o. female with medical history significant for recent pyloromyotomy on 03/27/2021 in [place] for diabetic gastroparesis, recurrent hospitalization for chronic abdominal pain, chronic pain, stroke, DM2, HFrEF, A fib on eliquis, CKD3b.  She presented to the hospital because of nausea, vomiting and abdominal pain.  Her abdominal symptoms have resolved and she is now able to tolerate diet.  She appears to have some intermittent behavioral disturbances, but according to husband at bedside this is her usual baseline and she has been dealing with a lot of medical issues recently which has caused her to have a lot of grief.  She was seen and assessed by psychiatry several days prior and there was concern that she may require reevaluation, however she is doing much better.  She is otherwise alert and oriented to person place and time and is able to answer questions appropriately.  She is in stable condition for discharge with medications as noted below.  Discharge Diagnoses:  Principal Problem:   Intractable nausea and vomiting Active Problems:   Type 2 diabetes mellitus with hyperglycemia, with long-term current use of insulin (HCC)   Chronic kidney disease, stage 3b (HCC)   Hypokalemia   Diabetic gastroparesis (HCC)   Chronic systolic CHF (congestive heart failure) (HCC)   PAF (paroxysmal atrial fibrillation) ([place])  Principal discharge diagnosis: Chronic  abdominal pain with nausea and vomiting in the setting of recent pyloromyotomy 03/27/21.  Discharge Instructions  Discharge Instructions     Diet - low sodium heart healthy   Complete by: As directed    Diet - low sodium heart healthy   Complete by: As directed    Increase activity slowly   Complete by: As directed    Increase activity slowly   Complete by: As directed       Allergies as of 04/21/2021       Reactions   Diazepam [name]   Gabapentin [name], Swelling   Other reaction(s): Unknown   Iodinated Contrast Media Anaphylaxis   11/29/17 Cardiac arrest 1 min after IV contrast, possible allergy vs vasovagal episode Iopamidol  Anaphylaxis  High 11/28/2017  Patient had seizure like activity and then code post 100 cc of isovue 300     Isovue [iopamidol] Anaphylaxis   11/28/17 Patient had seizure like activity and then 1 min code after 100 cc of isovue 300. Possible contrast allergy vs vasovagal episode   Lisinopril Anaphylaxis   Tongue and mouth swelling   Metoclopramide Other (See Comments)   Tardive dyskinesia   Nsaids Anaphylaxis, Other (See Comments)   ULCER   Penicillins Palpitations   Has patient had a PCN reaction causing immediate rash, facial/tongue/throat swelling, SOB or lightheadedness with hypotension: Yes, heart races Has patient had a PCN reaction causing severe rash involving mucus membranes or skin necrosis: No Has patient had a PCN reaction that required hospitalization: Yes  Has patient had a PCN reaction occurring within the last 10 years: No   Tolmetin Nausea Only, Other (See Comments)   ULCER   Cyclobenzaprine  Other reaction(s): Unknown   Rifamycins Other (See Comments)   unknown   Acetaminophen Nausea Only, Other (See Comments)   Irritates stomach ulcer; Abdominal pain   Oxycodone Palpitations   Tramadol Nausea And Vomiting        Medication List     STOP taking these medications    HYDROmorphone 4 MG  tablet Commonly known as: Dilaudid   NAC 600 MG Caps Generic drug: Acetylcysteine   nitroGLYCERIN 0.4 MG SL tablet Commonly known as: NITROSTAT       TAKE these medications    albuterol (2.5 MG/3ML) 0.083% nebulizer solution Commonly known as: PROVENTIL Take 3 mLs (2.5 mg total) by nebulization every 6 (six) hours as needed for wheezing or shortness of breath.   atorvastatin 10 MG tablet Commonly known as: LIPITOR TAKE ONE TABLET BY MOUTH ONCE DAILY (AM) What changed: See the new instructions.   baclofen 10 MG tablet Commonly known as: LIORESAL Take 1 tablet (10 mg total) by mouth 3 (three) times daily.   blood glucose meter kit and supplies Kit Dispense based on patient and insurance preference. Use up to four times daily as directed. (FOR ICD-9 250.00, 250.01).   cetirizine 10 MG tablet Commonly known as: ZYRTEC Take 1 tablet (10 mg total) by mouth daily.   DULoxetine HCl 40 MG Cpep Take 40 mg by mouth in the morning and at bedtime.   Easy Comfort Pen Needles 31G X 5 MM Misc Generic drug: Insulin Pen Needle USE 3 TIMES A DAY FOR INSULIN ADMINISTRATION   Eliquis 5 MG Tabs tablet Generic drug: apixaban TAKE 1 TABLET BY MOUTH 2 (TWO) TIMES DAILY. What changed: how much to take   famotidine 40 MG tablet Commonly known as: PEPCID Take 40 mg by mouth daily.   fluticasone 50 MCG/ACT nasal spray Commonly known as: FLONASE Place 2 sprays into both nostrils daily as needed for allergies or rhinitis.   folic acid 1 MG tablet Commonly known as: FOLVITE Take 1 mg by mouth daily.   hydrALAZINE 50 MG tablet Commonly known as: APRESOLINE Take 1 tablet (50 mg total) by mouth 3 (three) times daily.   isosorbide mononitrate 60 MG 24 hr tablet Commonly known as: IMDUR TAKE 1 TABLET (60 MG TOTAL) BY MOUTH DAILY (AM) What changed: See the new instructions.   Lantus SoloStar 100 UNIT/ML Solostar Pen Generic drug: insulin glargine Inject 20 Units into the skin at  bedtime. What changed: how much to take   meclizine 25 MG tablet Commonly known as: ANTIVERT Take 1 tablet (25 mg total) by mouth 3 (three) times daily as needed for dizziness.   metoCLOPramide 5 MG tablet Commonly known as: REGLAN TAKE 1 TABLET (5 MG TOTAL) BY MOUTH 4 TIMES DAILY. What changed: See the new instructions.   metoprolol succinate 50 MG 24 hr tablet Commonly known as: TOPROL-XL Take 1 tablet (50 mg total) by mouth daily. Take with or immediately following a meal.   NovoLOG FlexPen 100 UNIT/ML FlexPen Generic drug: insulin aspart Inject 10 Units into the skin 3 (three) times daily with meals. What changed:  how much to take when to take this   ondansetron 4 MG disintegrating tablet Commonly known as: Zofran ODT Take 1 tablet (4 mg total) by mouth every 8 (eight) hours as needed for nausea or vomiting.   pantoprazole 40 MG tablet Commonly known as: PROTONIX TAKE 1 TABLET (40 MG TOTAL) BY MOUTH 2 (TWO) TIMES DAILY. (AM+BEDTIME) What changed: See the new instructions.  sodium bicarbonate 650 MG tablet Take 1 tablet (650 mg total) by mouth 2 (two) times daily.   spironolactone 25 MG tablet Commonly known as: ALDACTONE Take 12.5 mg by mouth daily.   tiZANidine 4 MG tablet Commonly known as: ZANAFLEX Take 4 mg by mouth 3 (three) times daily as needed for muscle spasms.   Vitamin D (Ergocalciferol) 1.25 MG (50000 UNIT) Caps capsule Commonly known as: DRISDOL Take 50,000 Units by mouth once a week.        Follow-up Information     [facility] Follow up.   Why: [phone] in the office number for [place]. Please call if you have any question.        Cena Benton, MD. Schedule an appointment as soon as possible for a visit in 1 week(s).   Specialty: Internal Medicine Contact information: [address] [place] [address] [phone]                Allergies  Allergen Reactions   Diazepam  [name]   Gabapentin [name] and Swelling    Other reaction(s): Unknown   Iodinated Contrast Media Anaphylaxis    11/29/17 Cardiac arrest 1 min after IV contrast, possible allergy vs vasovagal episode Iopamidol  Anaphylaxis  High 11/28/2017  Patient had seizure like activity and then code post 100 cc of isovue 300     Isovue [Iopamidol] Anaphylaxis    11/28/17 Patient had seizure like activity and then 1 min code after 100 cc of isovue 300. Possible contrast allergy vs vasovagal episode   Lisinopril Anaphylaxis    Tongue and mouth swelling   Metoclopramide Other (See Comments)    Tardive dyskinesia   Nsaids Anaphylaxis and Other (See Comments)    ULCER   Penicillins Palpitations    Has patient had a PCN reaction causing immediate rash, facial/tongue/throat swelling, SOB or lightheadedness with hypotension: Yes, heart races Has patient had a PCN reaction causing severe rash involving mucus membranes or skin necrosis: No Has patient had a PCN reaction that required hospitalization: Yes  Has patient had a PCN reaction occurring within the last 10 years: No    Tolmetin Nausea Only and Other (See Comments)    ULCER   Cyclobenzaprine     Other reaction(s): Unknown   Rifamycins Other (See Comments)    unknown   Acetaminophen Nausea Only and Other (See Comments)    Irritates stomach ulcer; Abdominal pain   Oxycodone Palpitations   Tramadol Nausea And Vomiting    Consultations: Psychiatry   Procedures/Studies: CT Abdomen Pelvis Wo Contrast  Result Date: 04/13/2021 CLINICAL DATA:  56 year old female with history of generalized abdominal pain. EXAM: CT ABDOMEN AND PELVIS WITHOUT CONTRAST TECHNIQUE: Multidetector CT imaging of the abdomen and pelvis was performed following the standard protocol without IV contrast. RADIATION DOSE REDUCTION: This exam was performed according to the departmental dose-optimization program which includes automated exposure control,  adjustment of the mA and/or kV according to patient size and/or use of iterative reconstruction technique. COMPARISON:  CT the abdomen and pelvis 01/31/2021. FINDINGS: Lower chest: Unremarkable. Hepatobiliary: No definite suspicious cystic or solid hepatic lesions confidently identified on today's noncontrast CT examination. Status post cholecystectomy. Pancreas: No definite pancreatic mass or peripancreatic fluid collections or inflammatory changes are noted on today's noncontrast CT examination. Spleen: Unremarkable. Adrenals/Urinary Tract: There are no abnormal calcifications within the collecting system of either kidney, along the course of either ureter, or within the lumen of the  urinary bladder. No hydroureteronephrosis or perinephric stranding to suggest urinary tract obstruction at this time. The unenhanced appearance of the kidneys is unremarkable bilaterally. Unenhanced appearance of the urinary bladder is normal. Bilateral adrenal glands are normal in appearance. Stomach/Bowel: Several metallic densities are noted in the antral pre-pyloric region of the stomach, likely biopsy clips. Unenhanced appearance of the stomach is otherwise normal. No pathologic dilatation of small bowel or colon. Normal appendix. Vascular/Lymphatic: Aortic atherosclerosis. No lymphadenopathy noted in the abdomen or pelvis. Reproductive: Exophytic lesion in the left side of the uterine fundus measuring 4.9 cm in diameter, similar to the prior examination, likely to represent an exophytic fibroid subserosal. This obscures the left ovary. Right ovary is unremarkable in appearance. Other: No significant volume of ascites.  No pneumoperitoneum. Musculoskeletal: There are no aggressive appearing lytic or blastic lesions noted in the visualized portions of the skeleton. IMPRESSION: 1. No acute findings are noted in the abdomen or pelvis to account for the patient's symptoms. 2. Similar exophytic lesion associated with the left side of  the uterine fundus, favored to represent an exophytic fibroid, less likely a left adnexal lesion. This could be better evaluated with follow-up nonemergent pelvic ultrasound if of clinical concern. 3. Aortic atherosclerosis. 4. Additional incidental findings, as above. Electronically Signed   By: Vinnie Langton M.D.   On: 04/13/2021 10:55   CT HEAD WO CONTRAST (5MM)  Result Date: 04/16/2021 CLINICAL DATA:  Mental status change EXAM: CT HEAD WITHOUT CONTRAST TECHNIQUE: Contiguous axial images were obtained from the base of the skull through the vertex without intravenous contrast. RADIATION DOSE REDUCTION: This exam was performed according to the departmental dose-optimization program which includes automated exposure control, adjustment of the mA and/or kV according to patient size and/or use of iterative reconstruction technique. COMPARISON:  CT head 03/19/2017 FINDINGS: Brain: Motion degraded study. Two scans were obtained, both are degraded by motion. Ventricle size and cerebral volume normal. Negative for infarct, hemorrhage, mass. Vascular: Negative for hyperdense vessel Skull: Negative Sinuses/Orbits: Paranasal sinuses clear. No orbital lesion. Left cataract extraction Other: None IMPRESSION: No acute abnormality Motion degraded study. Electronically Signed   By: Franchot Gallo M.D.   On: 04/16/2021 17:20   DG ABD ACUTE 2+V W 1V CHEST  Result Date: 04/16/2021 CLINICAL DATA:  Abdominal pain EXAM: DG ABDOMEN ACUTE WITH 1 VIEW CHEST COMPARISON:  11/23/2020 FINDINGS: Transverse diameter of heart is increased. There are no signs of pulmonary edema or focal pulmonary consolidation. There is no pleural effusion or pneumothorax. Degenerative changes are noted in the right shoulder. Bowel gas pattern is nonspecific. Small amount of stool is seen in colon. There is no fecal impaction in the rectosigmoid. Surgical clips are seen in the right upper quadrant. No abnormal masses or calcifications are seen.  Degenerative changes are noted in the lower lumbar spine. IMPRESSION: Cardiomegaly. There are no focal infiltrates or signs of pulmonary edema. Bowel gas pattern is nonspecific. Electronically Signed   By: Elmer Picker M.D.   On: 04/16/2021 10:00     Discharge Exam: Vitals:   04/20/21 2231 04/21/21 0629  BP: (!) 154/69 129/72  Pulse: 96 (!) 103  Resp: 18 20  Temp: 98.5 F (36.9 C) 98.6 F (37 C)  SpO2: 100% 100%   Vitals:   04/20/21 0500 04/20/21 0600 04/20/21 2231 04/21/21 0629  BP:  138/90 (!) 154/69 129/72  Pulse:  94 96 (!) 103  Resp:  '19 18 20  ' Temp:  98.4 F (36.9 C) 98.5 F (36.9  C) 98.6 F (37 C)  TempSrc:  Axillary Axillary Oral  SpO2:   100% 100%  Weight: 126.9 kg   127.5 kg  Height:        General: Pt is alert, awake, not in acute distress, obese Cardiovascular: RRR, S1/S2 +, no rubs, no gallops Respiratory: CTA bilaterally, no wheezing, no rhonchi Abdominal: Soft, NT, ND, bowel sounds + Extremities: no edema, no cyanosis    The results of significant diagnostics from this hospitalization (including imaging, microbiology, ancillary and laboratory) are listed below for reference.     Microbiology: Recent Results (from the past 240 hour(s))  Resp Panel by RT-PCR (Flu A&B, Covid) Nasopharyngeal Swab     Status: None   Collection Time: 04/13/21  9:10 AM   Specimen: Nasopharyngeal Swab; Nasopharyngeal(NP) swabs in vial transport medium  Result Value Ref Range Status   SARS Coronavirus 2 by RT PCR NEGATIVE NEGATIVE Final    Comment: (NOTE) SARS-CoV-2 target nucleic acids are NOT DETECTED.  The SARS-CoV-2 RNA is generally detectable in upper respiratory specimens during the acute phase of infection. The lowest concentration of SARS-CoV-2 viral copies this assay can detect is 138 copies/mL. A negative result does not preclude SARS-Cov-2 infection and should not be used as the sole basis for treatment or other patient management decisions. A negative  result may occur with  improper specimen collection/handling, submission of specimen other than nasopharyngeal swab, presence of viral mutation(s) within the areas targeted by this assay, and inadequate number of viral copies(<138 copies/mL). A negative result must be combined with clinical observations, patient history, and epidemiological information. The expected result is Negative.  Fact Sheet for Patients:  EntrepreneurPulse.com.au  Fact Sheet for Healthcare Providers:  IncredibleEmployment.be  This test is no t yet approved or cleared by the [place] FDA and  has been authorized for detection and/or diagnosis of SARS-CoV-2 by FDA under an Emergency Use Authorization (EUA). This EUA will remain  in effect (meaning this test can be used) for the duration of the COVID-19 declaration under Section 564(b)(1) of the Act, 21 U.S.C.section 360bbb-3(b)(1), unless the authorization is terminated  or revoked sooner.       Influenza A by PCR NEGATIVE NEGATIVE Final   Influenza B by PCR NEGATIVE NEGATIVE Final    Comment: (NOTE) The Xpert Xpress SARS-CoV-2/FLU/RSV plus assay is intended as an aid in the diagnosis of influenza from Nasopharyngeal swab specimens and should not be used as a sole basis for treatment. Nasal washings and aspirates are unacceptable for Xpert Xpress SARS-CoV-2/FLU/RSV testing.  Fact Sheet for Patients: EntrepreneurPulse.com.au  Fact Sheet for Healthcare Providers: IncredibleEmployment.be  This test is not yet approved or cleared by the [place] FDA and has been authorized for detection and/or diagnosis of SARS-CoV-2 by FDA under an Emergency Use Authorization (EUA). This EUA will remain in effect (meaning this test can be used) for the duration of the COVID-19 declaration under Section 564(b)(1) of the Act, 21 U.S.C. section 360bbb-3(b)(1), unless the authorization is  terminated or revoked.  Performed at [facility], [place] [address], [place], [place] [address]      Labs: BNP (last 3 results) Recent Labs    05/25/20 1102 08/28/20 0937  BNP 51.2 60.4   Basic Metabolic Panel: Recent Labs  Lab 04/16/21 0338 04/17/21 0717 04/18/21 0649 04/19/21 0816 04/20/21 0706 04/21/21 0402  NA 137 141 141 140 142  --   K 3.7 4.5 4.1 3.9 4.3  --   CL 112* 115* 116*  117* 121*  --   CO2 16* 14* 17* 16* 16*  --   GLUCOSE 157* 164* 156* 173* 157*  --   BUN 51* 50* 51* 45* 36*  --   CREATININE 2.25* 2.00* 2.10* 2.01* 1.84*  --   CALCIUM 9.2 8.9 9.1 8.7* 8.9  --   MG 2.3 2.0 2.1 1.8 1.5* 1.4*   Liver Function Tests: No results for input(s): AST, ALT, ALKPHOS, BILITOT, PROT, ALBUMIN in the last 168 hours. No results for input(s): LIPASE, AMYLASE in the last 168 hours. Recent Labs  Lab 04/16/21 1734  AMMONIA <10   CBC: Recent Labs  Lab 04/16/21 0338 04/17/21 0717 04/17/21 2016 04/18/21 0649 04/19/21 0816 04/20/21 0943  WBC 12.6* 13.6*  --  9.9 10.0 11.8*  NEUTROABS 9.8* 10.9*  --  6.7 7.0 9.2*  HGB 7.1* 6.4* 7.2* 6.4* 7.0* 7.5*  HCT 23.6* 21.0* 23.3* 21.5* 23.7* 24.7*  MCV 93.7 92.5  --  95.6 96.3 94.3  PLT 361 354  --  384 374 419*   Cardiac Enzymes: Recent Labs  Lab 04/16/21 1454  CKTOTAL 97   BNP: Invalid input(s): POCBNP CBG: Recent Labs  Lab 04/20/21 1256 04/20/21 1647 04/20/21 2235 04/21/21 0738 04/21/21 1141  GLUCAP 187* 179* 158* 287* 291*   D-Dimer No results for input(s): DDIMER in the last 72 hours. Hgb A1c No results for input(s): HGBA1C in the last 72 hours. Lipid Profile No results for input(s): CHOL, HDL, LDLCALC, TRIG, CHOLHDL, LDLDIRECT in the last 72 hours. Thyroid function studies No results for input(s): TSH, T4TOTAL, T3FREE, THYROIDAB in the last 72 hours.  Invalid input(s): FREET3 Anemia work up No results for input(s): VITAMINB12, FOLATE, FERRITIN, TIBC, IRON, RETICCTPCT in the  last 72 hours. Urinalysis    Component Value Date/Time   COLORURINE YELLOW 04/16/2021 1747   APPEARANCEUR CLEAR 04/16/2021 1747   LABSPEC 1.012 04/16/2021 1747   PHURINE 5.0 04/16/2021 1747   GLUCOSEU NEGATIVE 04/16/2021 1747   HGBUR NEGATIVE 04/16/2021 1747   BILIRUBINUR NEGATIVE 04/16/2021 1747   KETONESUR NEGATIVE 04/16/2021 1747   PROTEINUR 100 (A) 04/16/2021 1747   UROBILINOGEN 0.2 10/02/2013 2108   NITRITE NEGATIVE 04/16/2021 1747   LEUKOCYTESUR NEGATIVE 04/16/2021 1747   Sepsis Labs Invalid input(s): PROCALCITONIN,  WBC,  LACTICIDVEN Microbiology Recent Results (from the past 240 hour(s))  Resp Panel by RT-PCR (Flu A&B, Covid) Nasopharyngeal Swab     Status: None   Collection Time: 04/13/21  9:10 AM   Specimen: Nasopharyngeal Swab; Nasopharyngeal(NP) swabs in vial transport medium  Result Value Ref Range Status   SARS Coronavirus 2 by RT PCR NEGATIVE NEGATIVE Final    Comment: (NOTE) SARS-CoV-2 target nucleic acids are NOT DETECTED.  The SARS-CoV-2 RNA is generally detectable in upper respiratory specimens during the acute phase of infection. The lowest concentration of SARS-CoV-2 viral copies this assay can detect is 138 copies/mL. A negative result does not preclude SARS-Cov-2 infection and should not be used as the sole basis for treatment or other patient management decisions. A negative result may occur with  improper specimen collection/handling, submission of specimen other than nasopharyngeal swab, presence of viral mutation(s) within the areas targeted by this assay, and inadequate number of viral copies(<138 copies/mL). A negative result must be combined with clinical observations, patient history, and epidemiological information. The expected result is Negative.  Fact Sheet for Patients:  EntrepreneurPulse.com.au  Fact Sheet for Healthcare Providers:  IncredibleEmployment.be  This test is no t yet approved or cleared  by the  [place] [employer]ural [employer] and  has been authorized for detection and/or diagnosis of SARS-CoV-2 by FDA under an Print production planner (EUA). This EUA will remain  in effect (meaning this test can be used) for the duration of the COVID-19 declaration under Section 564(b)(1) of the Act, 21 U.S.C.section 360bbb-3(b)(1), unless the authorization is terminated  or revoked sooner.       Influenza A by PCR NEGATIVE NEGATIVE Final   Influenza B by PCR NEGATIVE NEGATIVE Final    Comment: (NOTE) The Xpert Xpress SARS-CoV-2/FLU/RSV plus assay is intended as an aid in the diagnosis of influenza from Nasopharyngeal swab specimens and should not be used as a sole basis for treatment. Nasal washings and aspirates are unacceptable for Xpert Xpress SARS-CoV-2/FLU/RSV testing.  Fact Sheet for Patients: EntrepreneurPulse.com.au  Fact Sheet for Healthcare Providers: IncredibleEmployment.be  This test is not yet approved or cleared by the [place] FDA and has been authorized for detection and/or diagnosis of SARS-CoV-2 by FDA under an Emergency Use Authorization (EUA). This EUA will remain in effect (meaning this test can be used) for the duration of the COVID-19 declaration under Section 564(b)(1) of the Act, 21 U.S.C. section 360bbb-3(b)(1), unless the authorization is terminated or revoked.  Performed at [facility], [place] [address] [place], [place] [address]      Time coordinating discharge: 35 minutes  SIGNED:   [name], DO [facility] 04/21/2021, 1:34 PM  If 7PM-7AM, please contact night-coverage [email or web address]

## 2021-04-21 NOTE — Progress Notes (Addendum)
PROGRESS NOTE    MONZERAT HANDLER  HAL:[id number] DOB: 1965/08/11 DOA: 04/13/2021 PCP: Cena Benton, MD   Brief Narrative:   Deborah [name] is a 56 y.o. female with medical history significant for recent pyloromyotomy on 03/27/2021 in [place] for diabetic gastroparesis, recurrent hospitalization for chronic abdominal pain, chronic pain, stroke, DM2, HFrEF, A fib on eliquis, CKD3b.  She presented to the hospital because of nausea, vomiting and abdominal pain.  She is now noted to have some ongoing behavioral disturbances that will require further evaluation.  No prior history of behavioral issues.  Assessment & Plan:   Principal Problem:   Intractable nausea and vomiting Active Problems:   Type 2 diabetes mellitus with hyperglycemia, with long-term current use of insulin (HCC)   Chronic kidney disease, stage 3b (HCC)   Hypokalemia   Diabetic gastroparesis (HCC)   Chronic systolic CHF (congestive heart failure) (HCC)   PAF (paroxysmal atrial fibrillation) (HCC)   Diabetic gastroparesis, nausea, vomiting, abdominal pain, recent pyloromyotomy on 03/27/2021: She moved her bowels on 04/17/2021.  Continue soft diet.  Continue analgesics as needed for pain  Acute behavioral disturbances: Appreciate repeat psychiatric evaluation on 2/14.  Haldol prescribed as needed for agitation.  Does not appear to be safe for discharge at this point.  AKI on CKD stage IIIb, metabolic acidosis: Creatinine is stable.  Continue sodium bicarbonate for metabolic acidosis  Hypomagnesemia: Replete and reevaluate in a.m.  Acute on chronic anemia, anemia of chronic disease, iron deficiency anemia: H&H is stable.  S/p transfusion with 1 unit of PRBCs on 04/18/2021.  S/p IV iron infusion on 2/12 and 2/13.     Chronic systolic CHF and diastolic: Compensated.  Aldactone on hold.  2D echo in September 2020 showed EF estimated at 50 to 55% (there is an improvement in EF from 40 to 45% in November  2020)  Paroxysmal atrial fibrillation: Continue metoprolol.  Eliquis stopped on 2/7 given worsening anemia with need for PRBC transfusion and patient refusing to take this medication.   Opioid use disorder with suspected opioid withdrawal, intermittent agitation: She remains on Dilaudid for pain.  Follow-up with psychiatrist   Type II DM: Continue glargine and use NovoLog as needed for hyperglycemia   DVT prophylaxis:SCDs Code Status: Full Family Communication: Husband at bedside 2/14 Disposition Plan:  Status is: Inpatient Remains inpatient appropriate because: Continues to have some behavioral disturbances requiring psychiatric evaluation.  IV medications.   Consultants:  Psychiatry  Procedures:  See below  Antimicrobials:  Anti-infectives (From admission, onward)    None       Subjective: Patient seen and evaluated today and is quite agitated and screaming "I want to go home!"  She has taken all of her clothes off and refuses to answer any of my questions and is not fully oriented to time or place.  She was initially refusing her medications.  Discussions had with husband at bedside later in the day after Haldol given and he states that this is quite unusual behavior for her.  No acute overnight events noted.  Objective: Vitals:   04/20/21 0500 04/20/21 0600 04/20/21 2231 04/21/21 0629  BP:  138/90 (!) 154/69 129/72  Pulse:  94 96 (!) 103  Resp:  19 18 20   Temp:  98.4 F (36.9 C) 98.5 F (36.9 C) 98.6 F (37 C)  TempSrc:  Axillary Axillary Oral  SpO2:   100% 100%  Weight: 126.9 kg   127.5 kg  Height:  Intake/Output Summary (Last 24 hours) at 04/21/2021 1128 Last data filed at 04/21/2021 0100 Gross per 24 hour  Intake 480 ml  Output --  Net 480 ml   Filed Weights   04/18/21 0500 04/20/21 0500 04/21/21 0629  Weight: 126.6 kg 126.9 kg 127.5 kg    Examination:  General exam: Appears agitated, obese Respiratory system: Clear to auscultation.  Respiratory effort normal. Cardiovascular system: S1 & S2 heard, RRR.  Gastrointestinal system: Abdomen is soft Central nervous system: Alert and awake Extremities: No edema Skin: No significant lesions noted Psychiatry: Flat affect.    Data Reviewed: I have personally reviewed following labs and imaging studies  CBC: Recent Labs  Lab 04/16/21 0338 04/17/21 0717 04/17/21 2016 04/18/21 0649 04/19/21 0816 04/20/21 0943  WBC 12.6* 13.6*  --  9.9 10.0 11.8*  NEUTROABS 9.8* 10.9*  --  6.7 7.0 9.2*  HGB 7.1* 6.4* 7.2* 6.4* 7.0* 7.5*  HCT 23.6* 21.0* 23.3* 21.5* 23.7* 24.7*  MCV 93.7 92.5  --  95.6 96.3 94.3  PLT 361 354  --  384 374 250*   Basic Metabolic Panel: Recent Labs  Lab 04/16/21 0338 04/17/21 0717 04/18/21 0649 04/19/21 0816 04/20/21 0706 04/21/21 0402  NA 137 141 141 140 142  --   K 3.7 4.5 4.1 3.9 4.3  --   CL 112* 115* 116* 117* 121*  --   CO2 16* 14* 17* 16* 16*  --   GLUCOSE 157* 164* 156* 173* 157*  --   BUN 51* 50* 51* 45* 36*  --   CREATININE 2.25* 2.00* 2.10* 2.01* 1.84*  --   CALCIUM 9.2 8.9 9.1 8.7* 8.9  --   MG 2.3 2.0 2.1 1.8 1.5* 1.4*   GFR: Estimated Creatinine Clearance: 46.1 mL/min (A) (by C-G formula based on SCr of 1.84 mg/dL (H)). Liver Function Tests: No results for input(s): AST, ALT, ALKPHOS, BILITOT, PROT, ALBUMIN in the last 168 hours. No results for input(s): LIPASE, AMYLASE in the last 168 hours. Recent Labs  Lab 04/16/21 1734  AMMONIA <10   Coagulation Profile: No results for input(s): INR, PROTIME in the last 168 hours. Cardiac Enzymes: Recent Labs  Lab 04/16/21 1454  CKTOTAL 97   BNP (last 3 results) No results for input(s): PROBNP in the last 8760 hours. HbA1C: No results for input(s): HGBA1C in the last 72 hours. CBG: Recent Labs  Lab 04/20/21 0754 04/20/21 1256 04/20/21 1647 04/20/21 2235 04/21/21 0738  GLUCAP 163* 187* 179* 158* 287*   Lipid Profile: No results for input(s): CHOL, HDL, LDLCALC, TRIG,  CHOLHDL, LDLDIRECT in the last 72 hours. Thyroid Function Tests: No results for input(s): TSH, T4TOTAL, FREET4, T3FREE, THYROIDAB in the last 72 hours. Anemia Panel: No results for input(s): VITAMINB12, FOLATE, FERRITIN, TIBC, IRON, RETICCTPCT in the last 72 hours. Sepsis Labs: Recent Labs  Lab 04/16/21 1459 04/16/21 1734  LATICACIDVEN 1.0 1.1    Recent Results (from the past 240 hour(s))  Resp Panel by RT-PCR (Flu A&B, Covid) Nasopharyngeal Swab     Status: None   Collection Time: 04/13/21  9:10 AM   Specimen: Nasopharyngeal Swab; Nasopharyngeal(NP) swabs in vial transport medium  Result Value Ref Range Status   SARS Coronavirus 2 by RT PCR NEGATIVE NEGATIVE Final    Comment: (NOTE) SARS-CoV-2 target nucleic acids are NOT DETECTED.  The SARS-CoV-2 RNA is generally detectable in upper respiratory specimens during the acute phase of infection. The lowest concentration of SARS-CoV-2 viral copies this assay can detect is 138 copies/mL.  A negative result does not preclude SARS-Cov-2 infection and should not be used as the sole basis for treatment or other patient management decisions. A negative result may occur with  improper specimen collection/handling, submission of specimen other than nasopharyngeal swab, presence of viral mutation(s) within the areas targeted by this assay, and inadequate number of viral copies(<138 copies/mL). A negative result must be combined with clinical observations, patient history, and epidemiological information. The expected result is Negative.  Fact Sheet for Patients:  EntrepreneurPulse.com.au  Fact Sheet for Healthcare Providers:  IncredibleEmployment.be  This test is no t yet approved or cleared by the [place] FDA and  has been authorized for detection and/or diagnosis of SARS-CoV-2 by FDA under an Emergency Use Authorization (EUA). This EUA will remain  in effect (meaning this test can be used) for  the duration of the COVID-19 declaration under Section 564(b)(1) of the Act, 21 U.S.C.section 360bbb-3(b)(1), unless the authorization is terminated  or revoked sooner.       Influenza A by PCR NEGATIVE NEGATIVE Final   Influenza B by PCR NEGATIVE NEGATIVE Final    Comment: (NOTE) The Xpert Xpress SARS-CoV-2/FLU/RSV plus assay is intended as an aid in the diagnosis of influenza from Nasopharyngeal swab specimens and should not be used as a sole basis for treatment. Nasal washings and aspirates are unacceptable for Xpert Xpress SARS-CoV-2/FLU/RSV testing.  Fact Sheet for Patients: EntrepreneurPulse.com.au  Fact Sheet for Healthcare Providers: IncredibleEmployment.be  This test is not yet approved or cleared by the [place] FDA and has been authorized for detection and/or diagnosis of SARS-CoV-2 by FDA under an Emergency Use Authorization (EUA). This EUA will remain in effect (meaning this test can be used) for the duration of the COVID-19 declaration under Section 564(b)(1) of the Act, 21 U.S.C. section 360bbb-3(b)(1), unless the authorization is terminated or revoked.  Performed at [facility], [place] [address], [place], [place] [address]          Radiology Studies: No results found.      Scheduled Meds:  atorvastatin  10 mg Oral Daily   baclofen  10 mg Oral TID   bisacodyl  10 mg Rectal Daily   chlorhexidine  15 mL Mouth Rinse BID   folic acid  1 mg Oral Daily   hydrALAZINE  50 mg Oral TID   insulin aspart  0-20 Units Subcutaneous TID WC   insulin aspart  0-5 Units Subcutaneous QHS   insulin aspart  3 Units Subcutaneous TID WC   insulin glargine-yfgn  20 Units Subcutaneous QHS   isosorbide mononitrate  60 mg Oral Daily   lactulose  20 g Oral BID   linaclotide  145 mcg Oral QAC breakfast   mouth rinse  15 mL Mouth Rinse q12n4p   metoprolol succinate  50 mg Oral Daily   polyethylene glycol  17 g  Oral BID   senna-docusate  2 tablet Oral BID   sodium bicarbonate  650 mg Oral TID   Continuous Infusions:  magnesium sulfate bolus IVPB 4 g (04/21/21 1002)     LOS: 8 days    Time spent: 35 minutes    Rigby Swamy Darleen Crocker, DO [facility]  If 7PM-7AM, please contact night-coverage [email or web address] 04/21/2021, 11:28 AM

## 2021-04-22 IMAGING — US US RENAL
1 series · 14 of 25 positions shown · non-contrast
Comparison: Abdominal ultrasound 10/20/2009, CT abdomen/pelvis
08/21/2015

CLINICAL DATA: Acute kidney injury. Additional history provided:
Hypertension, diabetes mellitus,

EXAM:
RENAL / URINARY TRACT ULTRASOUND COMPLETE

[Series 1: us renal · 14 of 25 slices shown]
[im 1/25]
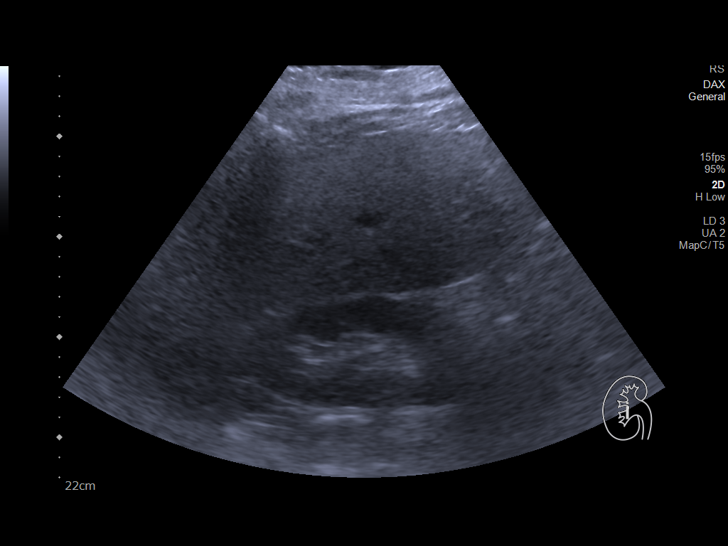
[im 3/25]
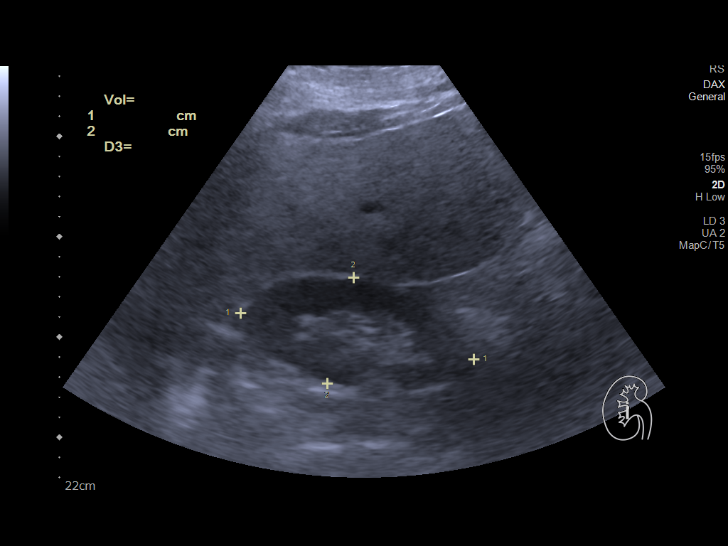
[im 5/25]
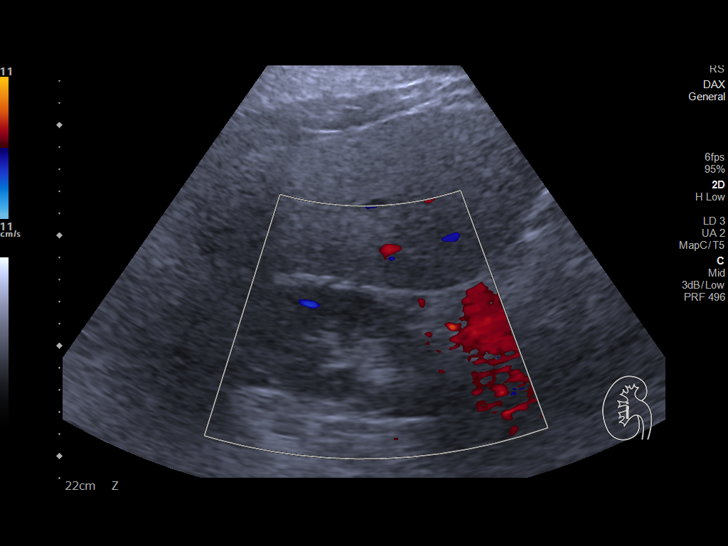
[im 7/25]
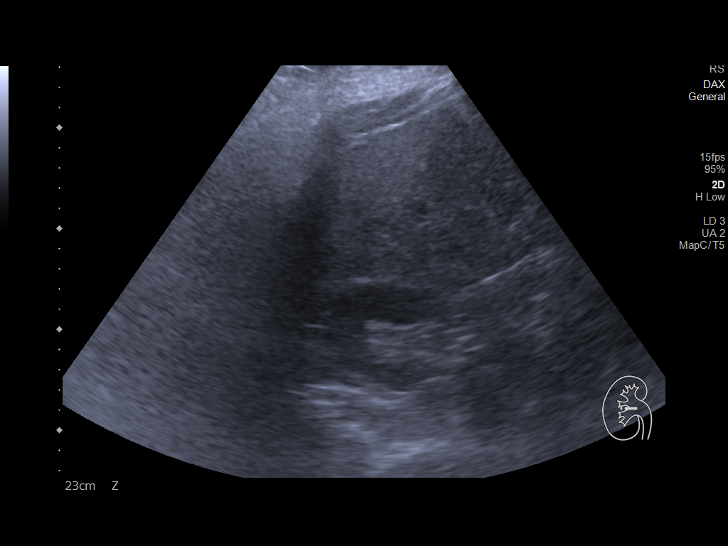
[im 9/25]
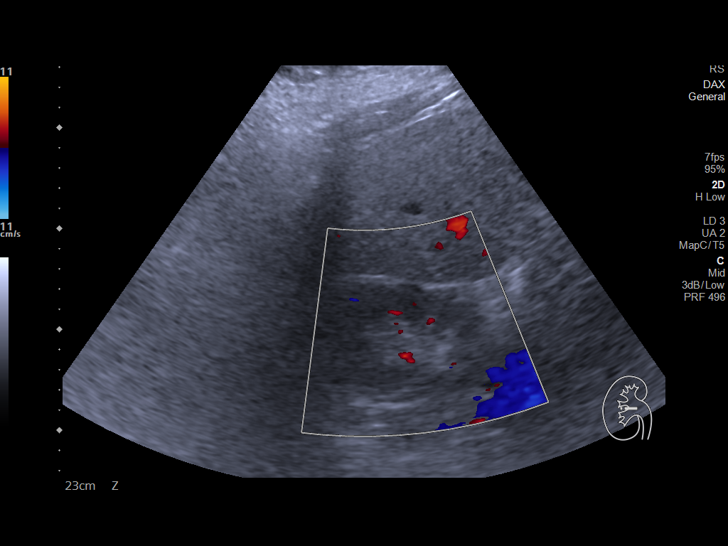
[im 10/25]
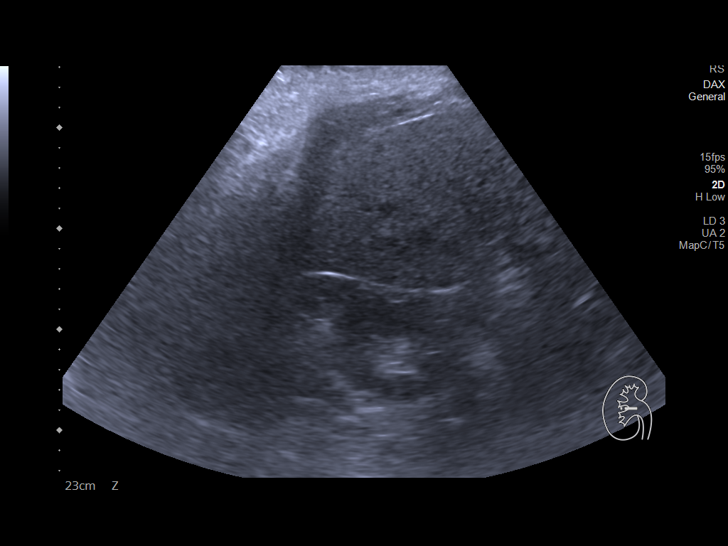
[im 12/25]
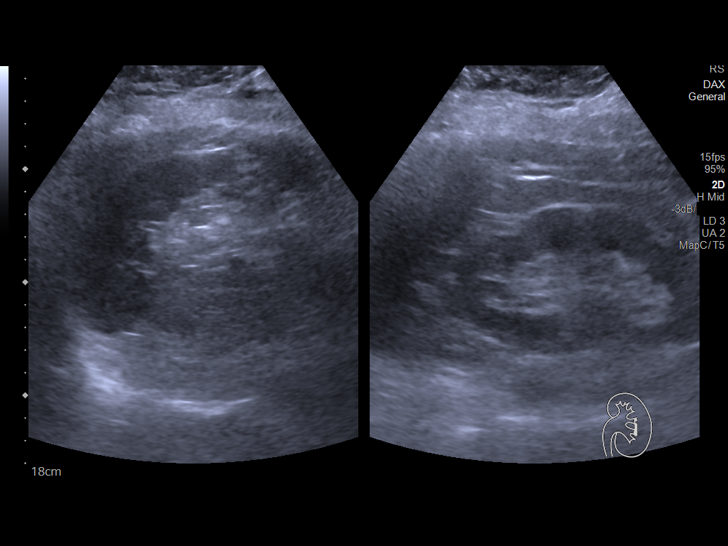
[im 14/25]
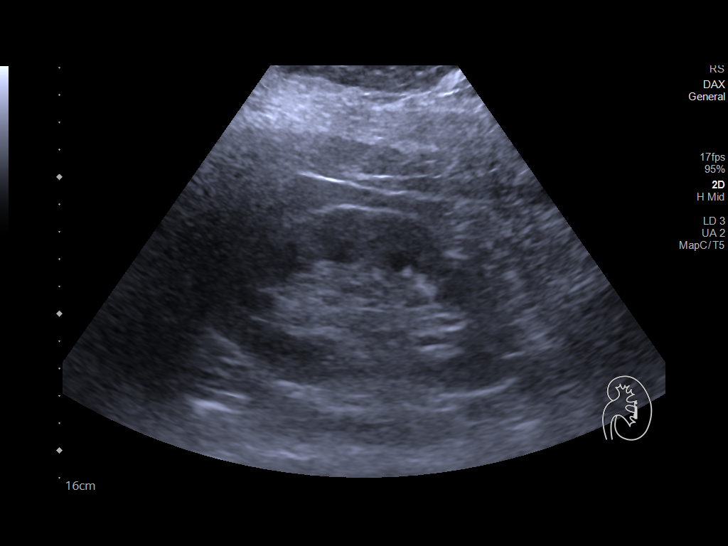
[im 16/25]
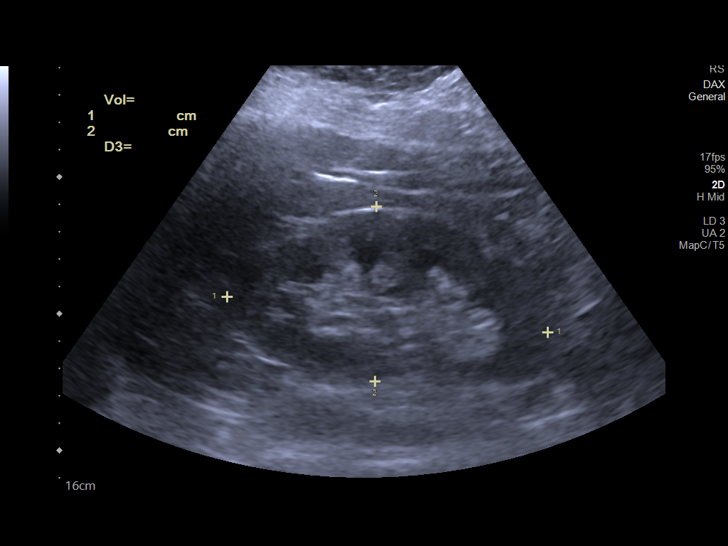
[im 17/25]
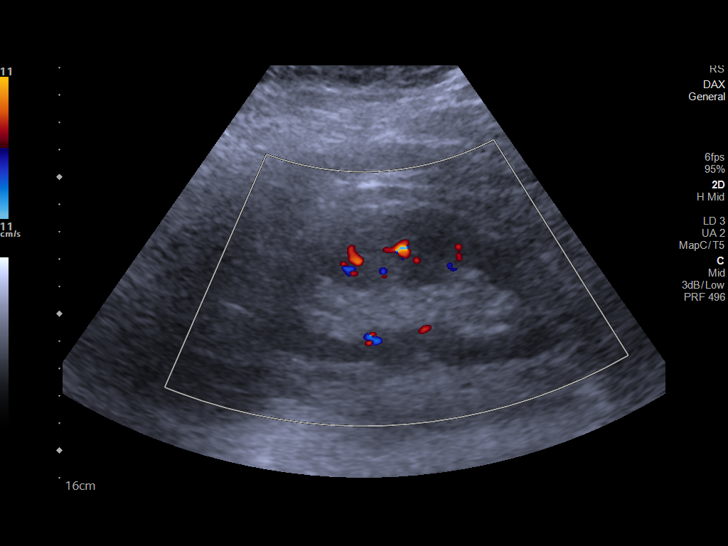
[im 19/25]
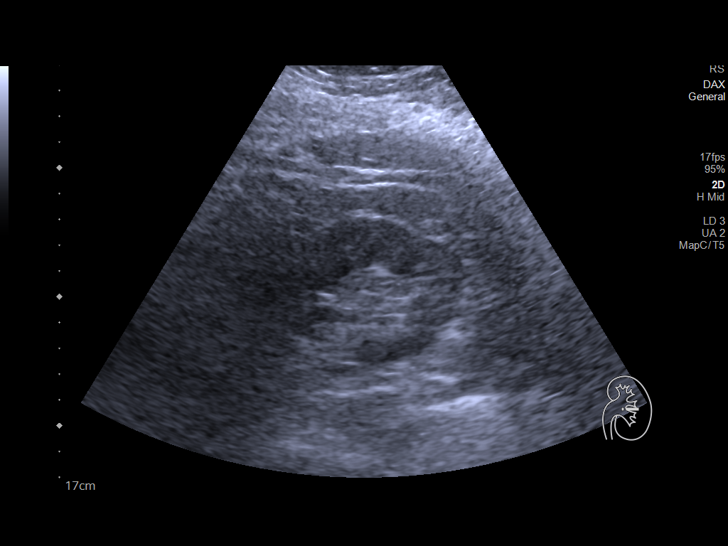
[im 21/25]
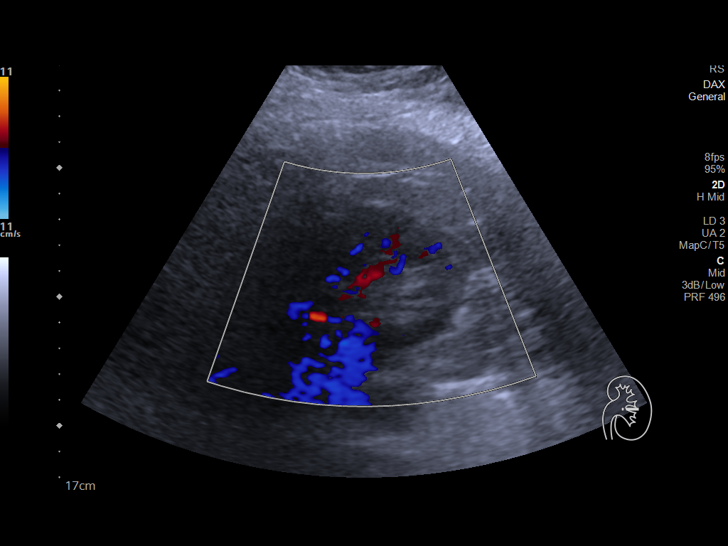
[im 23/25]
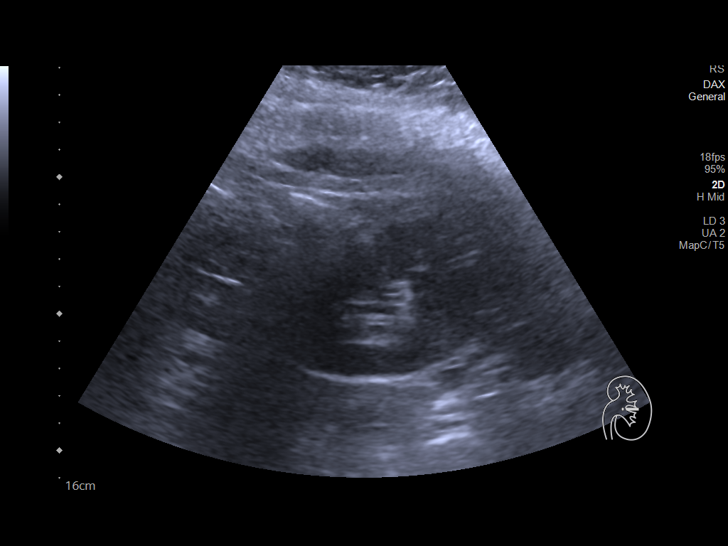
[im 25/25]
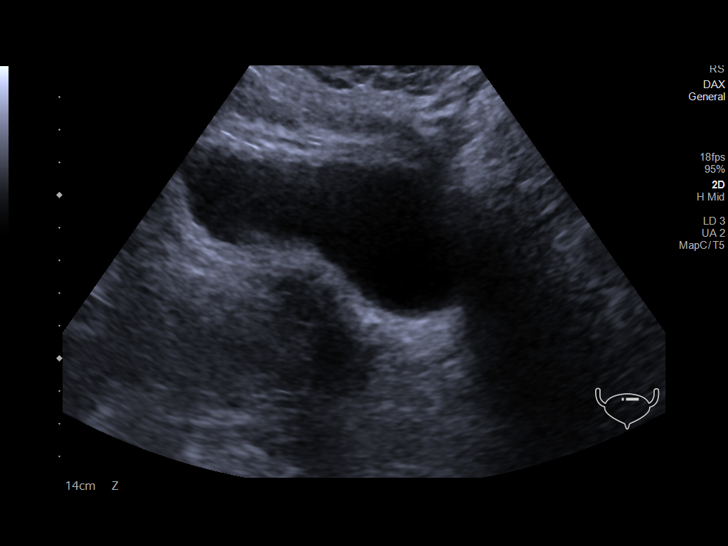

[14 of 25 positions shown; findings below may reference images not displayed]

FINDINGS: The examination is somewhat limited due to patient body habitus.

Right Kidney:

Renal measurements: 11.9 x 5.4 x 6.9 cm = volume: 231.2 mL.
Echogenicity within normal limits. No hydronephrosis.

Left Kidney:

Renal measurements: 11.8 x 6.4 x 6.1 cm = volume: 241.6 mL.
Echogenicity within normal limits. No hydronephrosis.

Bladder:

Appears normal for degree of bladder distention.
IMPRESSION: Somewhat limited examination as described.

Renal parenchymal volumes as provided.

No hydronephrosis.

## 2021-04-23 IMAGING — DX DG CHEST 1V PORT
1 series · 1 of 1 positions shown · non-contrast
Comparison: 03/11/2019

CLINICAL DATA: Cough

EXAM:
PORTABLE CHEST 1 VIEW

[chest ap]
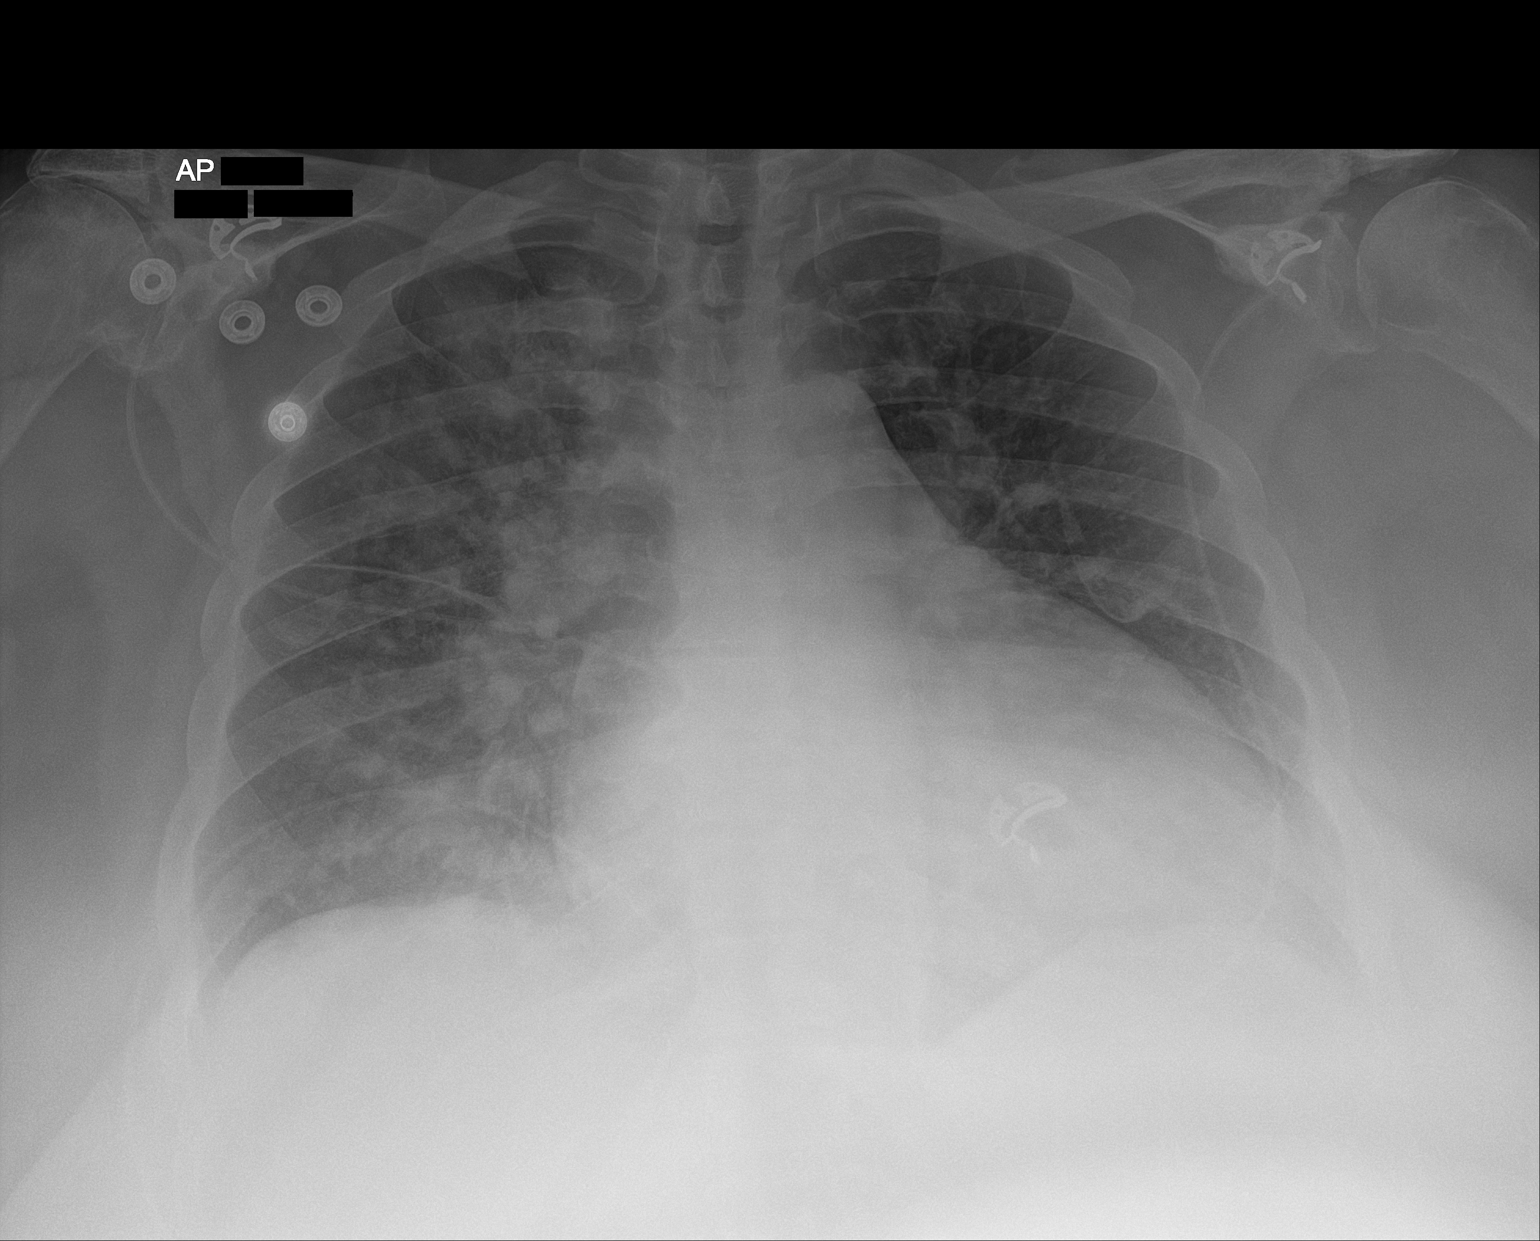

[1 of 1 positions shown; findings below may reference images not displayed]

FINDINGS: Bilateral interstitial and patchy alveolar airspace opacities right
greater than left. No pleural effusion or pneumothorax. Stable
cardiomegaly. No acute osseous abnormality.
IMPRESSION: Cardiomegaly with bilateral interstitial thickening and patchy
alveolar airspace opacities right greater than left concerning for
asymmetric pulmonary edema.

## 2021-05-20 ENCOUNTER — Encounter: Payer: Medicare Other | Admitting: Registered Nurse

## 2021-05-20 ENCOUNTER — Telehealth: Payer: Self-pay | Admitting: Registered Nurse

## 2021-05-20 NOTE — Telephone Encounter (Signed)
Patient states she has to cancel today due to pneumonia, would like to notify you she is out of meds as of today.  Thanks  ?

## 2021-05-20 NOTE — Telephone Encounter (Signed)
Deborah [name] was called today at 11;45 pm. ?Her chart was reviewed, her last office visit was in December. This provider was in contact with Dr Ranell Patrick regarding Deborah [name] telephone message, she can call office to schedule appointment when she feeling better. No medication will be prescribed today, per Dr Ranell Patrick request. She verbalizes understanding.  ?

## 2021-05-21 NOTE — Telephone Encounter (Signed)
error 

## 2021-05-30 ENCOUNTER — Emergency Department (HOSPITAL_COMMUNITY)
Admission: EM | Admit: 2021-05-30 | Discharge: 2021-05-30 | Disposition: A | Discharge status: Home / Self Care | Payer: Medicare Other | Attending: Emergency Medicine | Admitting: Emergency Medicine

## 2021-05-30 ENCOUNTER — Other Ambulatory Visit: Payer: Self-pay

## 2021-05-30 ENCOUNTER — Emergency Department (HOSPITAL_COMMUNITY): Payer: Medicare Other

## 2021-05-30 ENCOUNTER — Encounter (HOSPITAL_COMMUNITY): Payer: Self-pay | Admitting: Emergency Medicine

## 2021-05-30 DIAGNOSIS — R1110 Vomiting, unspecified: Secondary | ICD-10-CM

## 2021-05-30 DIAGNOSIS — E876 Hypokalemia: Secondary | ICD-10-CM | POA: Diagnosis not present

## 2021-05-30 DIAGNOSIS — I509 Heart failure, unspecified: Secondary | ICD-10-CM | POA: Insufficient documentation

## 2021-05-30 DIAGNOSIS — E1122 Type 2 diabetes mellitus with diabetic chronic kidney disease: Secondary | ICD-10-CM | POA: Insufficient documentation

## 2021-05-30 DIAGNOSIS — R112 Nausea with vomiting, unspecified: Secondary | ICD-10-CM | POA: Diagnosis not present

## 2021-05-30 DIAGNOSIS — R778 Other specified abnormalities of plasma proteins: Secondary | ICD-10-CM | POA: Diagnosis not present

## 2021-05-30 DIAGNOSIS — Z79899 Other long term (current) drug therapy: Secondary | ICD-10-CM | POA: Insufficient documentation

## 2021-05-30 DIAGNOSIS — D649 Anemia, unspecified: Secondary | ICD-10-CM | POA: Insufficient documentation

## 2021-05-30 DIAGNOSIS — D72829 Elevated white blood cell count, unspecified: Secondary | ICD-10-CM | POA: Diagnosis not present

## 2021-05-30 DIAGNOSIS — R000 Tachycardia, unspecified: Secondary | ICD-10-CM | POA: Diagnosis not present

## 2021-05-30 DIAGNOSIS — Z794 Long term (current) use of insulin: Secondary | ICD-10-CM | POA: Insufficient documentation

## 2021-05-30 DIAGNOSIS — R1033 Periumbilical pain: Secondary | ICD-10-CM | POA: Insufficient documentation

## 2021-05-30 DIAGNOSIS — N189 Chronic kidney disease, unspecified: Secondary | ICD-10-CM | POA: Diagnosis not present

## 2021-05-30 DIAGNOSIS — Z7901 Long term (current) use of anticoagulants: Secondary | ICD-10-CM | POA: Insufficient documentation

## 2021-05-30 DIAGNOSIS — R079 Chest pain, unspecified: Secondary | ICD-10-CM | POA: Diagnosis not present

## 2021-05-30 DIAGNOSIS — E1143 Type 2 diabetes mellitus with diabetic autonomic (poly)neuropathy: Secondary | ICD-10-CM | POA: Insufficient documentation

## 2021-05-30 DIAGNOSIS — R0602 Shortness of breath: Secondary | ICD-10-CM | POA: Diagnosis not present

## 2021-05-30 LAB — CBC
HCT: 30.7 % — ABNORMAL LOW (ref 36.0–46.0)
Hemoglobin: 9.7 g/dL — ABNORMAL LOW (ref 12.0–15.0)
MCH: 29.6 pg (ref 26.0–34.0)
MCHC: 31.6 g/dL (ref 30.0–36.0)
MCV: 93.6 fL (ref 80.0–100.0)
Platelets: 418 10*3/uL — ABNORMAL HIGH (ref 150–400)
RBC: 3.28 MIL/uL — ABNORMAL LOW (ref 3.87–5.11)
RDW: 14.9 % (ref 11.5–15.5)
WBC: 11.4 10*3/uL — ABNORMAL HIGH (ref 4.0–10.5)
nRBC: 0 % (ref 0.0–0.2)

## 2021-05-30 LAB — BASIC METABOLIC PANEL
Anion gap: 9 (ref 5–15)
BUN: 15 mg/dL (ref 6–20)
CO2: 19 mmol/L — ABNORMAL LOW (ref 22–32)
Calcium: 9.4 mg/dL (ref 8.9–10.3)
Chloride: 110 mmol/L (ref 98–111)
Creatinine, Ser: 2.07 mg/dL — ABNORMAL HIGH (ref 0.44–1.00)
GFR, Estimated: 27 mL/min — ABNORMAL LOW (ref >60)
Glucose, Bld: 89 mg/dL (ref 70–99)
Potassium: 3.3 mmol/L — ABNORMAL LOW (ref 3.5–5.1)
Sodium: 138 mmol/L (ref 135–145)

## 2021-05-30 LAB — I-STAT BETA HCG BLOOD, ED (MC, WL, AP ONLY): I-stat hCG, quantitative: <5 m[IU]/mL (ref <5)

## 2021-05-30 LAB — TROPONIN I (HIGH SENSITIVITY)
Troponin I (High Sensitivity): 18 ng/L — ABNORMAL HIGH (ref <18)
Troponin I (High Sensitivity): 15 ng/L (ref <18)

## 2021-05-30 LAB — LACTIC ACID, PLASMA: Lactic Acid, Venous: 0.9 mmol/L (ref 0.5–1.9)

## 2021-05-30 MED ORDER — PROMETHAZINE HCL 25 MG PO TABS: 25.0000 mg | ORAL_TABLET | Freq: Four times a day (QID) | ORAL | 0 refills | Status: DC | PRN

## 2021-05-30 MED ORDER — HYDROMORPHONE HCL 2 MG PO TABS
4.0000 mg | ORAL_TABLET | Freq: Once | ORAL | Status: AC
  Administered 2021-05-30: 4 mg via ORAL
  Filled 2021-05-30: qty 2

## 2021-05-30 MED ORDER — HYDROMORPHONE HCL 1 MG/ML IJ SOLN
1.0000 mg | Freq: Once | INTRAMUSCULAR | Status: AC
  Administered 2021-05-30: 1 mg via INTRAVENOUS
  Filled 2021-05-30: qty 1

## 2021-05-30 MED ORDER — ONDANSETRON HCL 4 MG/2ML IJ SOLN
4.0000 mg | Freq: Once | INTRAMUSCULAR | Status: AC
  Administered 2021-05-30: 4 mg via INTRAVENOUS
  Filled 2021-05-30: qty 2

## 2021-05-30 MED ORDER — LACTATED RINGERS IV SOLN: INTRAVENOUS | Status: DC

## 2021-05-30 MED ORDER — HALOPERIDOL LACTATE 5 MG/ML IJ SOLN
5.0000 mg | Freq: Once | INTRAMUSCULAR | Status: AC
Start: 2021-05-30 — End: 2021-05-30
  Administered 2021-05-30: 5 mg via INTRAVENOUS
  Filled 2021-05-30: qty 1

## 2021-05-30 MED ORDER — METOPROLOL TARTRATE 5 MG/5ML IV SOLN
5.0000 mg | Freq: Once | INTRAVENOUS | Status: AC
  Administered 2021-05-30: 5 mg via INTRAVENOUS
  Filled 2021-05-30: qty 5

## 2021-05-30 MED ORDER — ONDANSETRON 4 MG PO TBDP: 4.0000 mg | ORAL_TABLET | Freq: Three times a day (TID) | ORAL | 0 refills | Status: DC | PRN

## 2021-05-30 NOTE — Discharge Instructions (Addendum)
If you develop worsening, continued, or recurrent abdominal pain, uncontrolled vomiting, fever, cough or shortness of breath, chest or back pain, or any other new/concerning symptoms then return to the ER for evaluation.  ?

## 2021-05-30 NOTE — ED Provider Notes (Signed)
Patient was able to keep crackers and fluids down.  Vital signs are now normal.  Results reviewed and her CT questions some possible pneumonia but the patient has not had respiratory symptoms including no cough or shortness of breath and I think that is pretty unlikely.  Otherwise, she has been given IV fluids and pain/nausea control.  She has a pain contract so I cannot prescribe her any pain medicine but I will give her a dose of her home Dilaudid here and prescribe antiemetics as she states she is out.  Discharged home with return precautions. ?  ?Sherwood Gambler, MD ?05/30/21 1643 ? ?

## 2021-05-30 NOTE — ED Notes (Signed)
Pt ambulatory to bedside commode independently. Unable to void at this time.  ?

## 2021-05-30 NOTE — ED Triage Notes (Signed)
Pt to triage via [facility] from home.  Reports chest pain, SOB, nausea, and vomiting x 45 min that woke her up.  Pt arguing with significant other on EMS arrival. ? ? ?NTG x 1 with no changes ? ?

## 2021-05-30 NOTE — ED Provider Notes (Signed)
?[facility] ?Provider Note ? ? ?[name]: [id number] ?Arrival date & time: 05/30/21  9983 ? ?  ? ?History ? ?Chief Complaint  ?Patient presents with  ? Chest Pain  ? ? ?Deborah [name] is a 56 y.o. female. ? ? 56y/o female with recent pyloromyotomy on 03/27/2021 in [place] for diabetic gastroparesis, recurrent hospitalization for chronic abdominal pain, chronic pain, stroke, DM2, HF, A fib on eliquis, CKD who is presenting today with complaint of abdominal pain that is severe in the umbilical area with nausea, repeated bouts of emesis and reports she is also having pain that comes up in her chest a little bit and makes her feel short of breath.  She reports this does feel kind of like her gastroparesis flares but this feels much worse.  Patient is not able to give much further history due to her discomfort.  She does deny any cough, congestion or fever. ? ?The history is provided by the patient.  ?Chest Pain ? ?  ? ?Home Medications ?Prior to Admission medications   ?Medication Sig Start Date End Date Taking? Authorizing Provider  ?albuterol (PROVENTIL) (2.5 MG/3ML) 0.083% nebulizer solution Take 3 mLs (2.5 mg total) by nebulization every 6 (six) hours as needed for wheezing or shortness of breath. ?Patient not taking: Reported on 01/31/2021 04/06/19   Scot Jun, FNP  ?atorvastatin (LIPITOR) 10 MG tablet TAKE ONE TABLET BY MOUTH ONCE DAILY (AM) ?Patient taking differently: Take 10 mg by mouth daily. 10/22/20   Alcus Dad, MD  ?baclofen (LIORESAL) 10 MG tablet Take 1 tablet (10 mg total) by mouth 3 (three) times daily. 02/20/21   Raulkar, Clide Deutscher, MD  ?blood glucose meter kit and supplies KIT Dispense based on patient and insurance preference. Use up to four times daily as directed. (FOR ICD-9 250.00, 250.01). 12/13/18   [name], MD  ?cetirizine (ZYRTEC) 10 MG tablet Take 1 tablet (10 mg total) by mouth daily. 03/11/21   Raulkar, Clide Deutscher, MD  ?DULoxetine  HCl 40 MG CPEP Take 40 mg by mouth in the morning and at bedtime. 03/11/21   Raulkar, Clide Deutscher, MD  ?EASY COMFORT PEN NEEDLES 31G X 5 MM MISC USE 3 TIMES A DAY FOR INSULIN ADMINISTRATION 11/14/19   Meccariello, Bernita Raisin, DO  ?ELIQUIS 5 MG TABS tablet TAKE 1 TABLET BY MOUTH 2 (TWO) TIMES DAILY. ?Patient taking differently: Take 5 mg by mouth 2 (two) times daily. 12/20/19   Meccariello, Bernita Raisin, DO  ?famotidine (PEPCID) 40 MG tablet Take 40 mg by mouth daily. 04/07/21   [provider]  ?fluticasone (FLONASE) 50 MCG/ACT nasal spray Place 2 sprays into both nostrils daily as needed for allergies or rhinitis. 12/19/18   Rai, Vernelle Emerald, MD  ?folic acid (FOLVITE) 1 MG tablet Take 1 mg by mouth daily. 04/07/21   [provider]  ?hydrALAZINE (APRESOLINE) 50 MG tablet Take 1 tablet (50 mg total) by mouth 3 (three) times daily. 03/11/21   Raulkar, Clide Deutscher, MD  ?isosorbide mononitrate (IMDUR) 60 MG 24 hr tablet TAKE 1 TABLET (60 MG TOTAL) BY MOUTH DAILY (AM) ?Patient taking differently: 60 mg daily. 04/08/21   Raulkar, Clide Deutscher, MD  ?LANTUS SOLOSTAR 100 UNIT/ML Solostar Pen Inject 20 Units into the skin at bedtime. 04/20/21   Jennye Boroughs, MD  ?meclizine (ANTIVERT) 25 MG tablet Take 1 tablet (25 mg total) by mouth 3 (three) times daily as needed for dizziness. 02/20/21   Raulkar, Clide Deutscher, MD  ?metoCLOPramide (  REGLAN) 5 MG tablet TAKE 1 TABLET (5 MG TOTAL) BY MOUTH 4 TIMES DAILY. ?Patient taking differently: Take 5 mg by mouth 4 (four) times daily. 03/12/21   Raulkar, Clide Deutscher, MD  ?metoprolol succinate (TOPROL-XL) 50 MG 24 hr tablet Take 1 tablet (50 mg total) by mouth daily. Take with or immediately following a meal. 11/24/20 04/13/21  Enzo Bi, MD  ?NOVOLOG FLEXPEN 100 UNIT/ML FlexPen Inject 10 Units into the skin 3 (three) times daily with meals. 04/20/21   Jennye Boroughs, MD  ?ondansetron (ZOFRAN ODT) 4 MG disintegrating tablet Take 1 tablet (4 mg total) by mouth every 8 (eight) hours as needed for nausea or  vomiting. 09/14/20   Tedd Sias, PA  ?pantoprazole (PROTONIX) 40 MG tablet TAKE 1 TABLET (40 MG TOTAL) BY MOUTH 2 (TWO) TIMES DAILY. (AM+BEDTIME) ?Patient taking differently: 40 mg 2 (two) times daily. 04/08/21   Raulkar, Clide Deutscher, MD  ?sodium bicarbonate 650 MG tablet Take 1 tablet (650 mg total) by mouth 2 (two) times daily. 04/20/21   Jennye Boroughs, MD  ?spironolactone (ALDACTONE) 25 MG tablet Take 12.5 mg by mouth daily. 01/23/21   [provider]  ?tiZANidine (ZANAFLEX) 4 MG tablet Take 4 mg by mouth 3 (three) times daily as needed for muscle spasms. 08/11/20   [provider]  ?Vitamin D, Ergocalciferol, (DRISDOL) 1.25 MG (50000 UNIT) CAPS capsule Take 50,000 Units by mouth once a week. 04/07/21   [provider]  ?   ? ?Allergies    ?Diazepam, Gabapentin, Iodinated contrast media, Isovue [iopamidol], Lisinopril, Metoclopramide, Nsaids, Penicillins, Tolmetin, Cyclobenzaprine, Rifamycins, Acetaminophen, Oxycodone, and Tramadol   ? ?Review of Systems   ?Review of Systems  ?Cardiovascular:  Positive for chest pain.  ? ?Physical Exam ?Updated Vital Signs ?BP (!) 143/80   Pulse 96   Temp 98.7 ?F (37.1 ?C) (Oral)   Resp 14   LMP 10/10/2012   SpO2 96%  ?Physical Exam ?Vitals and nursing note reviewed.  ?Constitutional:   ?   General: She is in acute distress.  ?   Appearance: She is well-developed.  ?   Comments: Patient is hysterical, crying, grabbing her abdomen writhing in pain  ?HENT:  ?   Head: Normocephalic and atraumatic.  ?   Mouth/Throat:  ?   Mouth: Mucous membranes are dry.  ?Eyes:  ?   Pupils: Pupils are equal, round, and reactive to light.  ?Cardiovascular:  ?   Rate and Rhythm: Regular rhythm. Tachycardia present.  ?   Heart sounds: Normal heart sounds. No murmur heard. ?  No friction rub.  ?Pulmonary:  ?   Effort: Pulmonary effort is normal.  ?   Breath sounds: Normal breath sounds. No wheezing or rales.  ?Abdominal:  ?   General: Bowel sounds are normal. There is no  distension.  ?   Palpations: Abdomen is soft.  ?   Tenderness: There is abdominal tenderness in the periumbilical area. There is no guarding or rebound.  ?Musculoskeletal:     ?   General: No tenderness. Normal range of motion.  ?   Cervical back: Normal range of motion and neck supple.  ?   Right lower leg: No edema.  ?   Left lower leg: No edema.  ?   Comments: No edema  ?Skin: ?   General: Skin is warm and dry.  ?   Findings: No rash.  ?Neurological:  ?   Mental Status: She is alert and oriented to person, place, and time.  Mental status is at baseline.  ?   Cranial Nerves: No cranial nerve deficit.  ?Psychiatric:  ?   Comments: Crying, writhing in the bed  ? ? ?ED Results / Procedures / Treatments   ?Labs ?(all labs ordered are listed, but only abnormal results are displayed) ?Labs Reviewed  ?BASIC METABOLIC PANEL - Abnormal; Notable for the following components:  ?    Result Value  ? Potassium 3.3 (*)   ? CO2 19 (*)   ? Creatinine, Ser 2.07 (*)   ? GFR, Estimated 27 (*)   ? All other components within normal limits  ?CBC - Abnormal; Notable for the following components:  ? WBC 11.4 (*)   ? RBC 3.28 (*)   ? Hemoglobin 9.7 (*)   ? HCT 30.7 (*)   ? Platelets 418 (*)   ? All other components within normal limits  ?TROPONIN I (HIGH SENSITIVITY) - Abnormal; Notable for the following components:  ? Troponin I (High Sensitivity) 18 (*)   ? All other components within normal limits  ?LACTIC ACID, PLASMA  ?I-STAT BETA HCG BLOOD, ED ([facility], [facility], [facility] ONLY)  ?TROPONIN I (HIGH SENSITIVITY)  ? ? ?EKG ?EKG Interpretation ? ?Date/Time:  Saturday May 30 2021 12:08:21 EDT ?Ventricular Rate:  127 ?PR Interval:  158 ?QRS Duration: 85 ?QT Interval:  306 ?QTC Calculation: 445 ?R Axis:   -36 ?Text Interpretation: Sinus tachycardia Multiple premature complexes, vent & supraven Left anterior fascicular block Anteroseptal infarct, age indeterminate ST elevation, consider inferior injury No significant change since last tracing Confirmed by  Blanchie Dessert [phone]) on 05/30/2021 12:23:50 PM ? ?Radiology ?CT ABDOMEN PELVIS WO CONTRAST ? ?Result Date: 05/30/2021 ?CLINICAL DATA:  Abdominal pain EXAM: CT ABDOMEN AND PELVIS WITHOUT CONTRAST TECHNIQUE:

## 2021-06-01 ENCOUNTER — Telehealth: Payer: Self-pay

## 2021-06-01 ENCOUNTER — Other Ambulatory Visit: Payer: Self-pay

## 2021-06-01 ENCOUNTER — Encounter: Payer: Medicare Other | Attending: Registered Nurse | Admitting: Registered Nurse

## 2021-06-01 ENCOUNTER — Encounter: Payer: Self-pay | Admitting: Registered Nurse

## 2021-06-01 VITALS — BP 124/76 | HR 104

## 2021-06-01 DIAGNOSIS — C7A010 Malignant carcinoid tumor of the duodenum: Secondary | ICD-10-CM | POA: Diagnosis present

## 2021-06-01 DIAGNOSIS — Z5181 Encounter for therapeutic drug level monitoring: Secondary | ICD-10-CM | POA: Insufficient documentation

## 2021-06-01 DIAGNOSIS — Z79891 Long term (current) use of opiate analgesic: Secondary | ICD-10-CM | POA: Insufficient documentation

## 2021-06-01 DIAGNOSIS — G893 Neoplasm related pain (acute) (chronic): Secondary | ICD-10-CM | POA: Diagnosis present

## 2021-06-01 DIAGNOSIS — G894 Chronic pain syndrome: Secondary | ICD-10-CM | POA: Diagnosis present

## 2021-06-01 MED ORDER — HYDROMORPHONE HCL 4 MG PO TABS
4.0000 mg | ORAL_TABLET | Freq: Two times a day (BID) | ORAL | 0 refills | Status: DC
Start: 2021-06-01 — End: 2021-06-02

## 2021-06-01 NOTE — Telephone Encounter (Signed)
[name] Reviewed. Pharmacy will be called.  ?

## 2021-06-01 NOTE — Telephone Encounter (Signed)
Patient called and stated the pharmacy gave her 7 day supply of the Hydromorphone. She stated you informed her to call if that happens so a new prescription can be sent in to her pharmacy. Please advise ?

## 2021-06-01 NOTE — Progress Notes (Signed)
? ?Subjective:  ? ? Patient ID: Deborah [name], female    DOB: 04-01-1965, 56 y.o.   MRN: [id number] ? ?HPI: Deborah [name] is a 56 y.o. female who returns for follow up appointment for chronic pain and medication refill. She states her pain is located in her abdomen, she is very tearful. She rates her pain 10. She states she is not following a exercise regimen, she uses a wheelchair at all times.   ? ?Deborah [name] was hospitalized at [facility] on 03/20/2021- and discharged on 04/03/2021. ? ?Deborah [name] was admitted to [facility] 04/13/2021 and discharged on 04/21/2021, she was admitted with Intractable Nausea and Vomiting.  ? ?Discharge Summary was Reviewed.  ? ?Deborah [name] went to [facility] on 05/30/2021 with Vomiting, note was reviewed.  ? ?Pain Inventory ?Average Pain 10 ?Pain Right Now 10 ?My pain is constant, sharp, stabbing, and aching ? ?In the last 24 hours, has pain interfered with the following? ?General activity 10 ?Relation with others 10 ?Enjoyment of life 10 ?What TIME of day is your pain at its worst? morning , daytime, evening, and night ?Sleep (in general) Poor ? ?Pain is worse with: walking, bending, sitting, inactivity, standing, and some activites ?Pain improves with: medication ?Relief from Meds: 4 ? ?Family History  ?Problem Relation Age of Onset  ? Diabetes Mother   ? Diabetes Father   ? Heart disease Father   ? Diabetes Sister   ? Congestive Heart Failure Sister 9  ? Diabetes Brother   ? ?Social History  ? ?Socioeconomic History  ? Marital status: Married  ?  Spouse name: Not on file  ? Number of children: Not on file  ? Years of education: Not on file  ? Highest education level: Not on file  ?Occupational History  ? Not on file  ?Tobacco Use  ? Smoking status: Never  ? Smokeless tobacco: Never  ?Vaping Use  ? Vaping Use: Never used  ?Substance and Sexual Activity  ? Alcohol use: No  ? Drug use: No  ? Sexual activity: Not Currently  ?  Birth  control/protection: None  ?Other Topics Concern  ? Not on file  ?Social History Narrative  ? ** Merged History Encounter **  ?    ? ?Social Determinants of Health  ? ?Financial Resource Strain: Not on file  ?Food Insecurity: Not on file  ?Transportation Needs: Not on file  ?Physical Activity: Not on file  ?Stress: Not on file  ?Social Connections: Not on file  ? ?Past Surgical History:  ?Procedure Laterality Date  ? BIOPSY  07/27/2019  ? Procedure: BIOPSY;  Surgeon: Clarene Essex, MD;  [facility];  Service: Endoscopy;;  ? BIOPSY  07/30/2019  ? Procedure: BIOPSY;  Surgeon: Otis Brace, MD;  [facility];  Service: Gastroenterology;;  ? CATARACT EXTRACTION  01/2014  ? CHOLECYSTECTOMY    ? COLONOSCOPY WITH PROPOFOL N/A 07/30/2019  ? Procedure: COLONOSCOPY WITH PROPOFOL;  Surgeon: Otis Brace, MD;  [facility];  Service: Gastroenterology;  Laterality: N/A;  ? ESOPHAGOGASTRODUODENOSCOPY N/A 07/27/2019  ? Procedure: ESOPHAGOGASTRODUODENOSCOPY (EGD);  Surgeon: Clarene Essex, MD;  [facility];  Service: Endoscopy;  Laterality: N/A;  ? ESOPHAGOGASTRODUODENOSCOPY N/A 07/26/2020  ? Procedure: ESOPHAGOGASTRODUODENOSCOPY (EGD);  Surgeon: Arta Silence, MD;  Location: [facility];  Service: Endoscopy;  Laterality: N/A;  ? ESOPHAGOGASTRODUODENOSCOPY (EGD) WITH PROPOFOL N/A 08/02/2019  ? Procedure: ESOPHAGOGASTRODUODENOSCOPY (EGD) WITH PROPOFOL;  Surgeon: Otis Brace, MD;  [facility];  Service: Gastroenterology;  Laterality: N/A;  ? HEMOSTASIS CLIP PLACEMENT  08/02/2019  ? Procedure: HEMOSTASIS CLIP PLACEMENT;  Surgeon: Otis Brace, MD;  [facility];  Service: Gastroenterology;;  ? POLYPECTOMY  07/30/2019  ? Procedure: POLYPECTOMY;  Surgeon: Otis Brace, MD;  [facility];  Service: Gastroenterology;;  ? POLYPECTOMY  08/02/2019  ? Procedure: POLYPECTOMY;  Surgeon: Otis Brace, MD;  [facility];  Service:  Gastroenterology;;  ? ?Past Surgical History:  ?Procedure Laterality Date  ? BIOPSY  07/27/2019  ? Procedure: BIOPSY;  Surgeon: Clarene Essex, MD;  [facility];  Service: Endoscopy;;  ? BIOPSY  07/30/2019  ? Procedure: BIOPSY;  Surgeon: Otis Brace, MD;  [facility];  Service: Gastroenterology;;  ? CATARACT EXTRACTION  01/2014  ? CHOLECYSTECTOMY    ? COLONOSCOPY WITH PROPOFOL N/A 07/30/2019  ? Procedure: COLONOSCOPY WITH PROPOFOL;  Surgeon: Otis Brace, MD;  [facility];  Service: Gastroenterology;  Laterality: N/A;  ? ESOPHAGOGASTRODUODENOSCOPY N/A 07/27/2019  ? Procedure: ESOPHAGOGASTRODUODENOSCOPY (EGD);  Surgeon: Clarene Essex, MD;  Location[facility];  Service: Endoscopy;  Laterality: N/A;  ? ESOPHAGOGASTRODUODENOSCOPY N/A 07/26/2020  ? Procedure: ESOPHAGOGASTRODUODENOSCOPY (EGD);  Surgeon: Arta Silence, MD;  Location[facility];  Service: Endoscopy;  Laterality: N/A;  ? ESOPHAGOGASTRODUODENOSCOPY (EGD) WITH PROPOFOL N/A 08/02/2019  ? Procedure: ESOPHAGOGASTRODUODENOSCOPY (EGD) WITH PROPOFOL;  Surgeon: Otis Brace, MD;  [facility];  Service: Gastroenterology;  Laterality: N/A;  ? HEMOSTASIS CLIP PLACEMENT  08/02/2019  ? Procedure: HEMOSTASIS CLIP PLACEMENT;  Surgeon: Otis Brace, MD;  [facility];  Service: Gastroenterology;;  ? POLYPECTOMY  07/30/2019  ? Procedure: POLYPECTOMY;  Surgeon: Otis Brace, MD;  [facility];  Service: Gastroenterology;;  ? POLYPECTOMY  08/02/2019  ? Procedure: POLYPECTOMY;  Surgeon: Otis Brace, MD;  [facility];  Service: Gastroenterology;;  ? ?Past Medical History:  ?Diagnosis Date  ? Acute back pain with sciatica, left   ? Acute back pain with sciatica, right   ? AKI (acute kidney injury) ([place])   ? Anemia, unspecified   ? Cancer [facility])   ? Carcinoid tumor of duodenum   ? Chest pain with normal coronary angiography 2019  ? Chronic kidney disease, stage 3b ([place])   ? Chronic  pain   ? Chronic systolic CHF (congestive heart failure) ([place])   ? Diabetes mellitus   ? DKA (diabetic ketoacidosis) ([place])   ? Drug-seeking behavior   ? 21 hospitalizations and 14 CT a/p in 2 years for N/V and abdominal pain, demanding only IV dilaudid  ? Elevated troponin   ? chronic  ? Esophageal reflux   ? Fibromyalgia   ? Gastric ulcer   ? Gastroparesis   ? Gout   ? Hyperlipidemia   ? Hypertension   ? Hypokalemia   ? Hypomagnesemia   ? Lumbosacral stenosis   ? LVH (left ventricular hypertrophy)   ? Morbid obesity ([place])   ? NICM (nonischemic cardiomyopathy) ([place])   ? PAF (paroxysmal atrial fibrillation) ([place])   ? Stroke [facility]) 02/2011  ? Thrombocytosis   ? Vitamin B12 deficiency anemia   ? ?BP 124/76   Pulse (!) 104   LMP 10/10/2012   SpO2 98%  ? ?Opioid Risk Score:   ?Fall Risk Score:  `1 ? ?Depression screen PHQ 2/9 ? ? ?  08/01/2020  ?  2:56 PM 06/05/2020  ?  3:01 PM 01/04/2020  ?  2:20 PM 11/15/2019  ?  3:35 PM 11/14/2019  ?  2:04 PM 10/10/2019  ?  1:45  PM 09/20/2019  ?  2:56 PM  ?Depression screen PHQ 2/9  ?Decreased Interest '1 1 2 3 1 3   '$ ?Down, Depressed, Hopeless '1 1 3 3 1 3   '$ ?PHQ - 2 Score '2 2 5 6 2 6   '$ ?Altered sleeping   '3 3  3 3  '$ ?Tired, decreased energy   '3 3  3 3  '$ ?Change in appetite   '3 3  3 3  '$ ?Feeling bad or failure about yourself    '3 3  2 2  '$ ?Trouble concentrating   '3 3  3 3  '$ ?Moving slowly or fidgety/restless   '3 3  3 1  '$ ?Suicidal thoughts   0 3  0 1  ?PHQ-9 Score   '23 27  23   '$ ?Difficult doing work/chores       Very difficult  ?  ? ?Review of Systems  ?Gastrointestinal:  Positive for abdominal pain, nausea and vomiting.  ?All other systems reviewed and are negative. ? ?   ?Objective:  ? Physical Exam ?Vitals and nursing note reviewed.  ?Constitutional:   ?   Appearance: Normal appearance.  ?Cardiovascular:  ?   Rate and Rhythm: Normal rate and regular rhythm.  ?   Pulses: Normal pulses.  ?   Heart sounds: Normal heart sounds.  ?Pulmonary:  ?   Effort: Pulmonary effort is normal.  ?   Breath sounds:  Normal breath sounds.  ?Musculoskeletal:  ?   Cervical back: Normal range of motion and neck supple.  ?   Comments: Normal Muscle Bulk and Muscle Testing Reveals:  ?Upper Extremities: Full ROM and Muscle Strength 5/5 ? Deborah [name]

## 2021-06-02 ENCOUNTER — Telehealth: Payer: Self-pay | Admitting: Registered Nurse

## 2021-06-02 ENCOUNTER — Telehealth: Payer: Self-pay

## 2021-06-02 MED ORDER — HYDROMORPHONE HCL 4 MG PO TABS: 4.0000 mg | ORAL_TABLET | Freq: Two times a day (BID) | ORAL | 0 refills | Status: DC

## 2021-06-02 NOTE — Addendum Note (Signed)
Addended by: Bayard Hugger on: 06/02/2021 01:00 PM ? ? Modules accepted: Orders ? ?

## 2021-06-02 NOTE — Telephone Encounter (Signed)
PA for Hydromorphone sent to insurance through CoverMyMeds ?

## 2021-06-02 NOTE — Telephone Encounter (Signed)
Deborah [name] states she only received 7 days of prescription, needs remaining called in.  Thanks ? ?

## 2021-06-02 NOTE — Telephone Encounter (Signed)
[name] was reviewed. Pharmacy was called. Remainder of prescription was sent to pharmacy.  ?

## 2021-06-03 ENCOUNTER — Encounter: Payer: Self-pay | Admitting: *Deleted

## 2021-06-03 NOTE — Telephone Encounter (Signed)
Request Reference Number: [id number]. HYDROMORPHON TAB '4MG'$  is approved through 03/07/2022. Your patient may now fill this prescription and it will be covered ?

## 2021-06-03 NOTE — Telephone Encounter (Signed)
Opened in error

## 2021-06-08 LAB — TOXASSURE SELECT,+ANTIDEPR,UR

## 2021-06-16 ENCOUNTER — Telehealth: Payer: Self-pay | Admitting: *Deleted

## 2021-06-16 NOTE — Telephone Encounter (Signed)
Urine drug screen for this encounter is consistent for prescribed medication 

## 2021-06-30 ENCOUNTER — Other Ambulatory Visit: Payer: Self-pay | Admitting: Physical Medicine and Rehabilitation

## 2021-06-30 ENCOUNTER — Other Ambulatory Visit: Payer: Self-pay | Admitting: Family Medicine

## 2021-07-07 ENCOUNTER — Telehealth: Payer: Self-pay | Admitting: *Deleted

## 2021-07-07 MED ORDER — HYDROMORPHONE HCL 4 MG PO TABS: 4.0000 mg | ORAL_TABLET | Freq: Two times a day (BID) | ORAL | 0 refills | Status: DC

## 2021-07-07 NOTE — Telephone Encounter (Signed)
Deborah [name] called to request a refill on her dilaudid.  She has 2 pills left for tomorrow. Per [name] last fill was 06/08/21 #46 ?

## 2021-07-07 NOTE — Telephone Encounter (Signed)
[name] was Reviewed ?Hydromorphone e-scribed today.  ?Placed a call to Ms. Mccowen regarding the above, she verbalizes understanding.  ?

## 2021-07-19 IMAGING — US US RENAL
1 series · 14 of 25 positions shown · non-contrast
Comparison: None.

CLINICAL DATA: ARMIR

EXAM:
RENAL / URINARY TRACT ULTRASOUND COMPLETE

[Series 1: us renal · 14 of 35 slices shown]
[im 1/35]
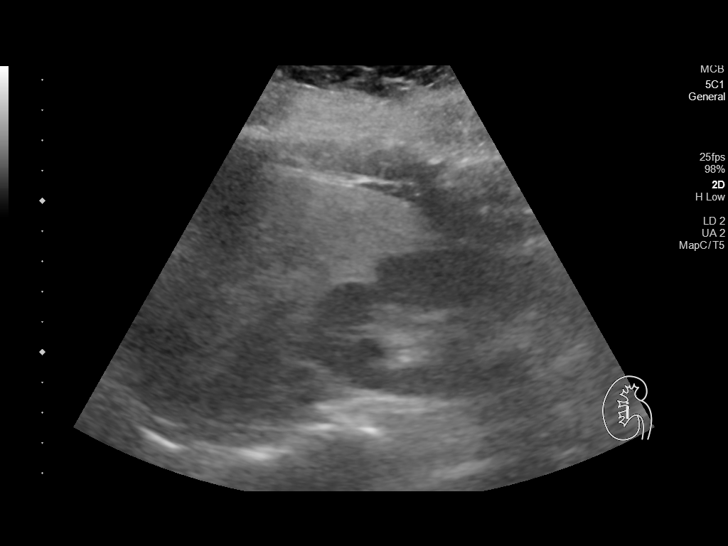
[im 3/35]
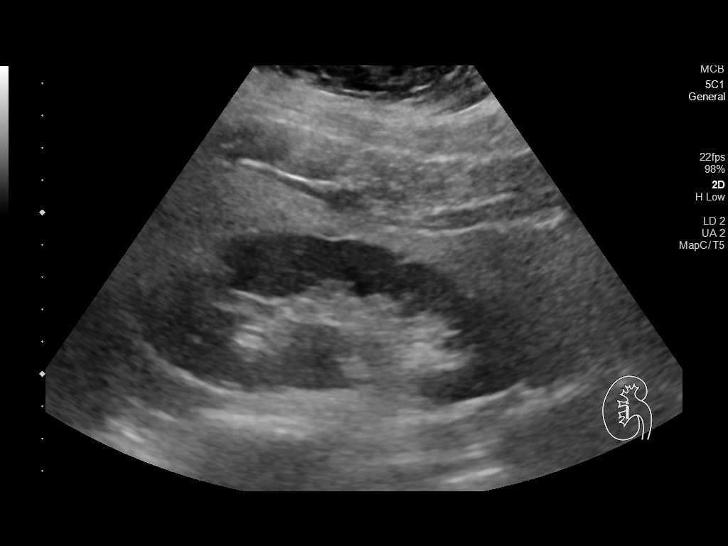
[im 6/35]
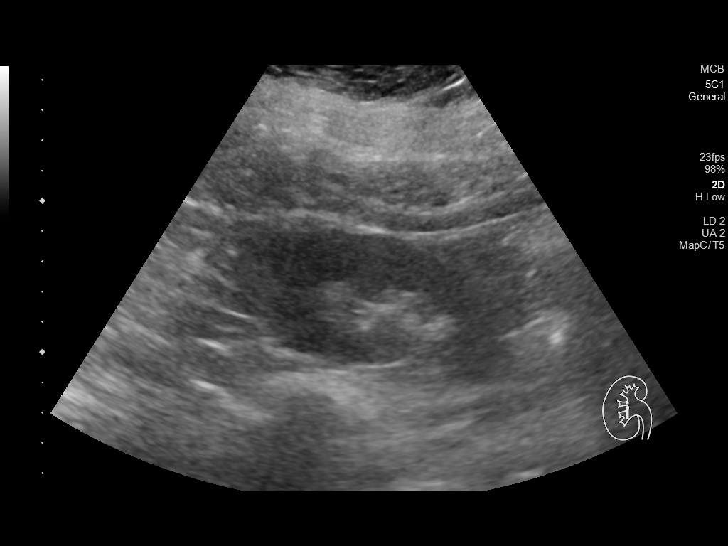
[im 9/35]
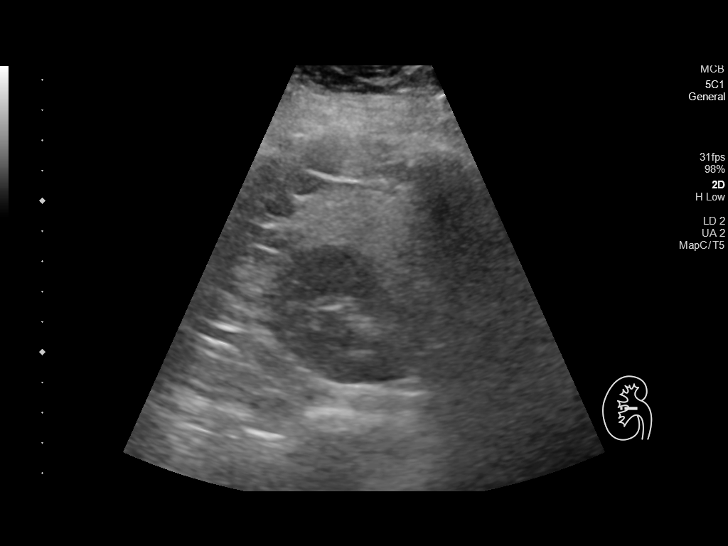
[im 12/35]
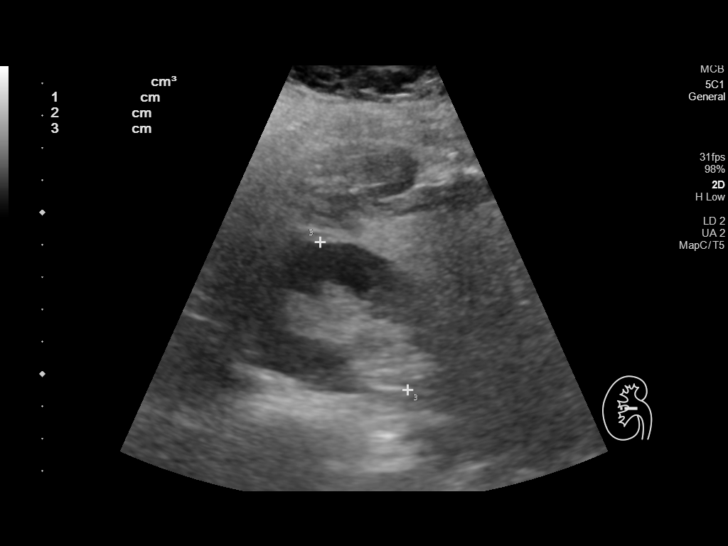
[im 13/35]
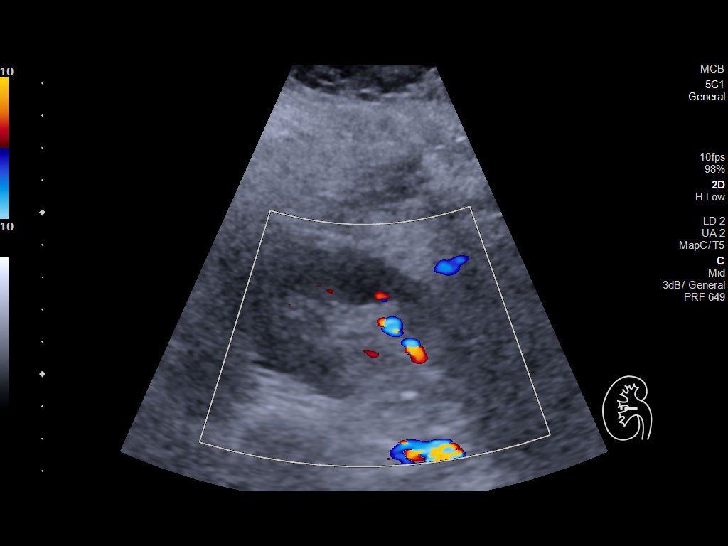
[im 16/35]
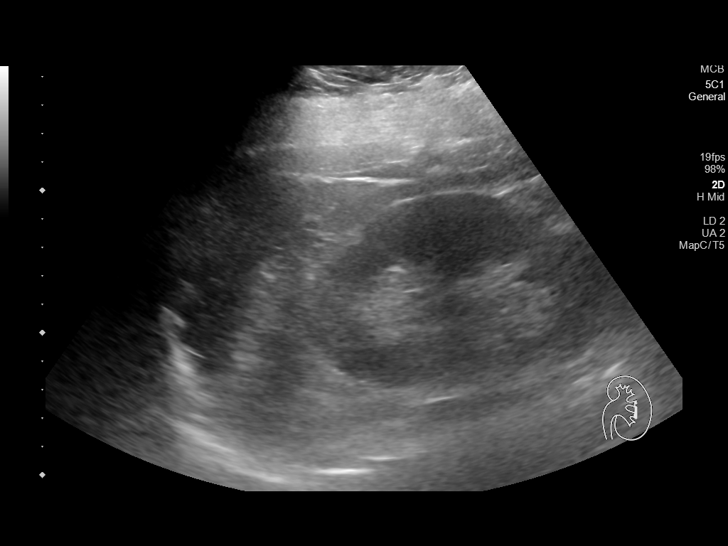
[im 19/35]
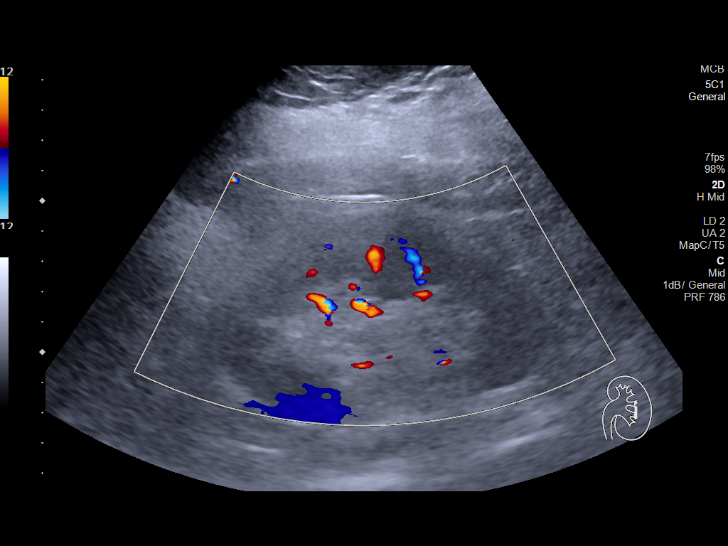
[im 22/35]
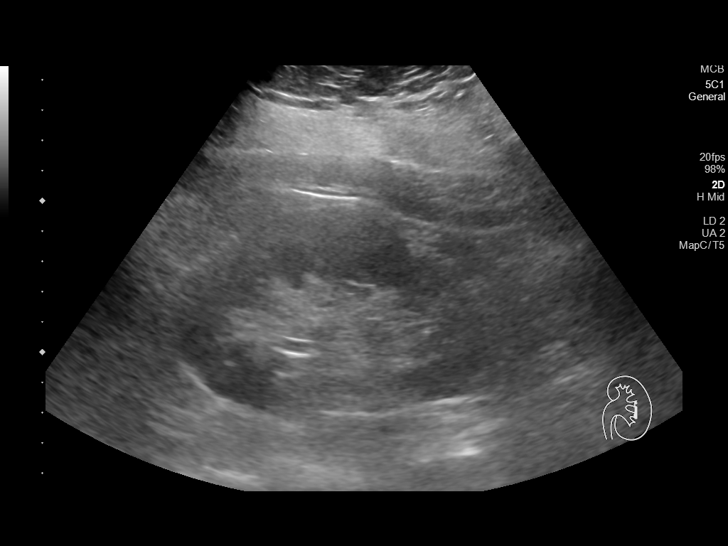
[im 23/35]
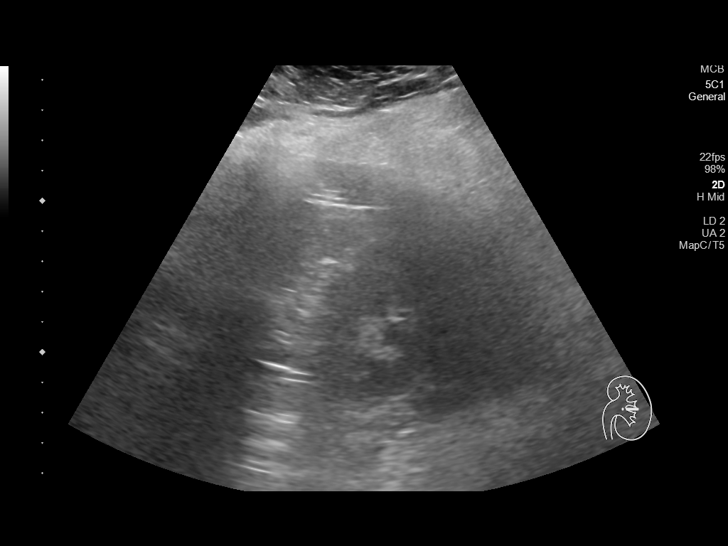
[im 26/35]
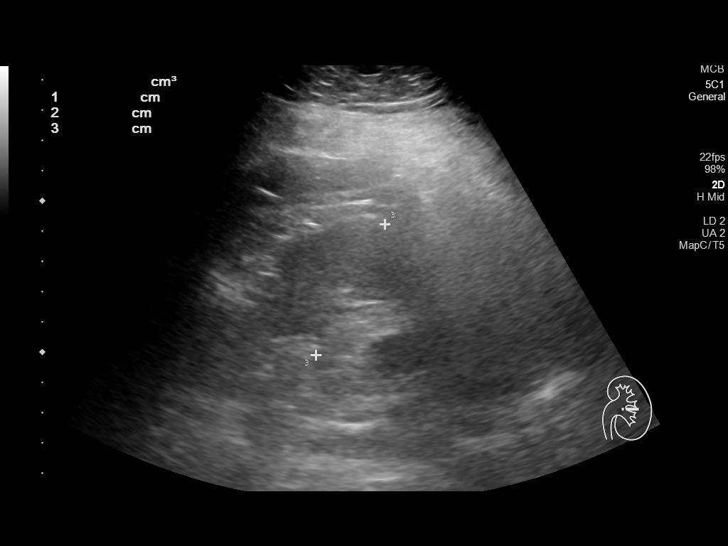
[im 29/35]
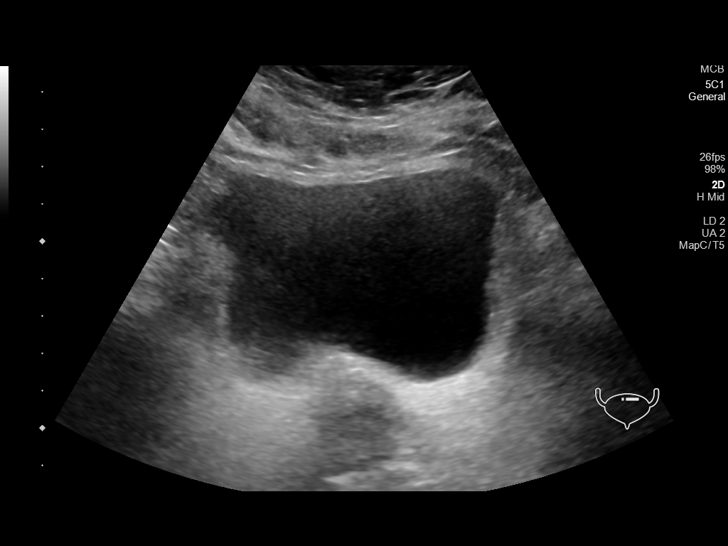
[im 32/35]
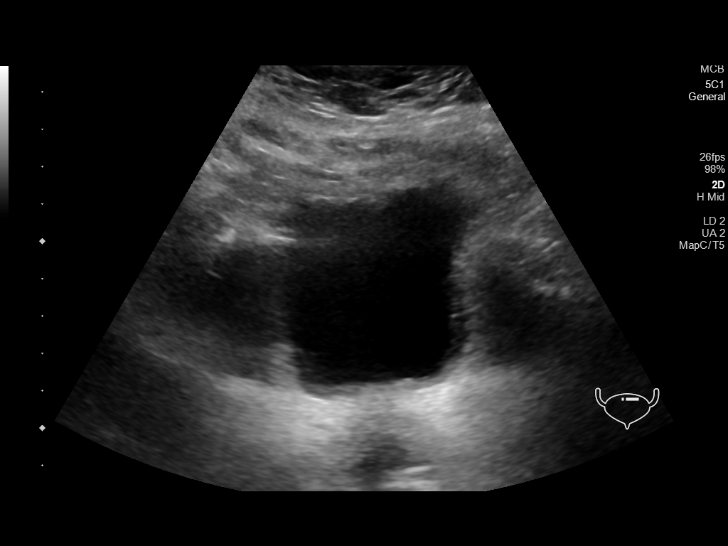
[im 35/35]
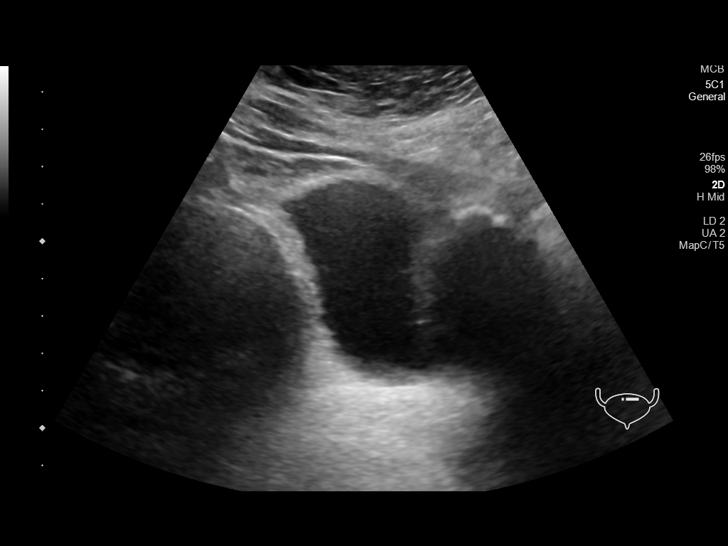

[14 of 25 positions shown; findings below may reference images not displayed]

FINDINGS: Right Kidney:

Renal measurements: 12 x 4.8 x 5.3 cm = volume: 160.6 mL .
Echogenicity within normal limits. No mass or hydronephrosis
visualized.

Left Kidney:

Renal measurements: 12 x 5.9 x 4.9 cm = volume: 22 mL. Echogenicity
within normal limits. No mass or hydronephrosis visualized.

Bladder:

Appears normal for degree of bladder distention.

Other:

Increased liver echogenicity likely reflecting steatosis.
IMPRESSION: No hydronephrosis.

## 2021-07-19 IMAGING — CT CT ABD-PELV W/O CM
2 of 4 series · 17 of 46 positions shown, 19 images · non-contrast
Comparison: May 27, 2019

CLINICAL DATA: Nausea and vomiting.

EXAM:
CT ABDOMEN AND PELVIS WITHOUT CONTRAST
TECHNIQUE: Multidetector CT imaging of the abdomen and pelvis was performed
following the standard protocol without IV contrast.

[Series 3: axial st · axial · 0.82mm/px · z∈[-466,-56]mm · 14 of 92 slices shown, 16 images]
[im 5/92  soft-tissue]
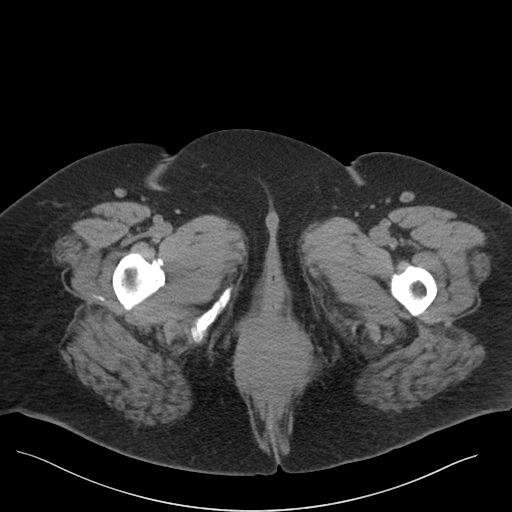
[im 5/92  bone]
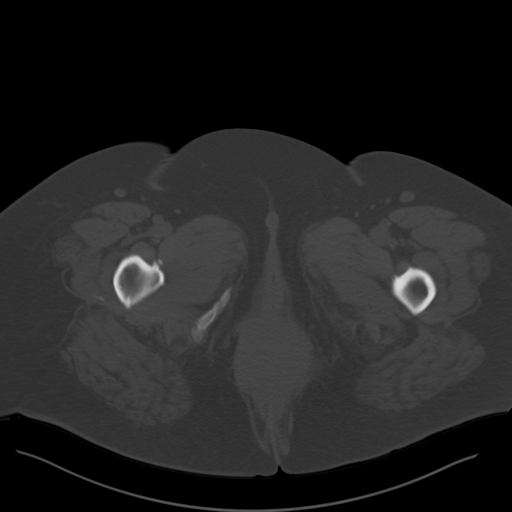
[im 14/92  soft-tissue]
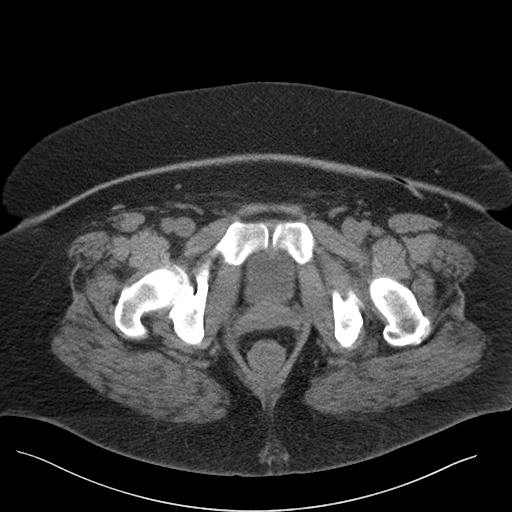
[im 18/92  soft-tissue]
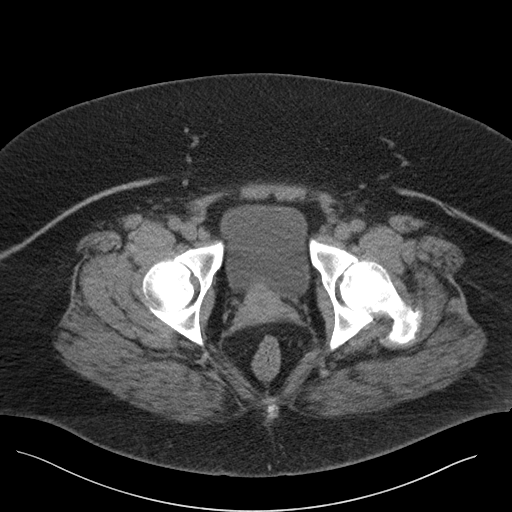
[im 27/92  soft-tissue]
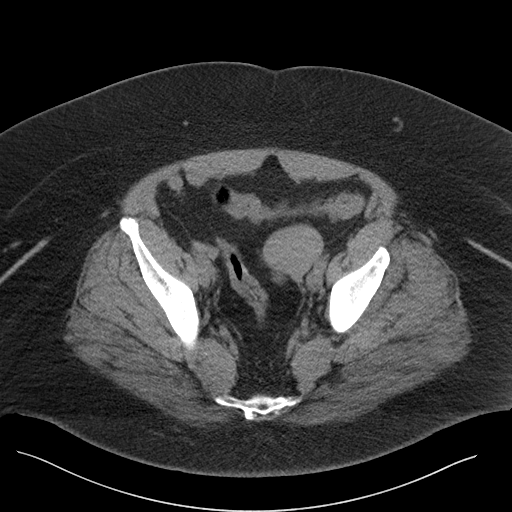
[im 31/92  soft-tissue]
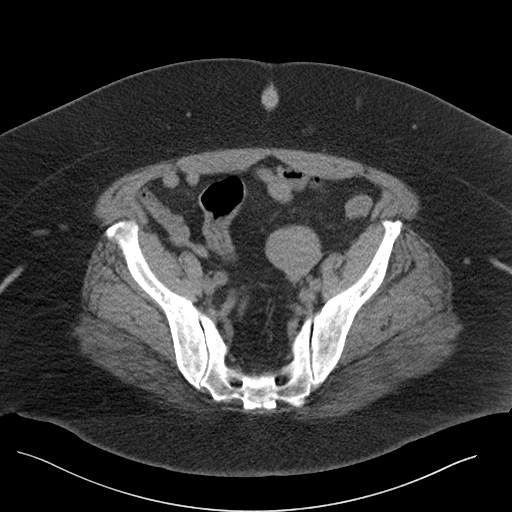
[im 35/92  soft-tissue]
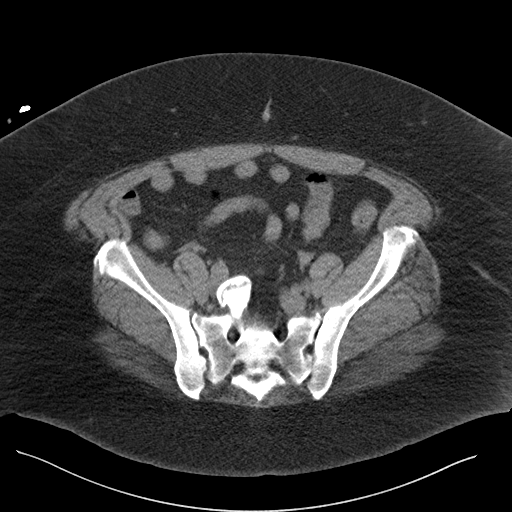
[im 44/92  soft-tissue]
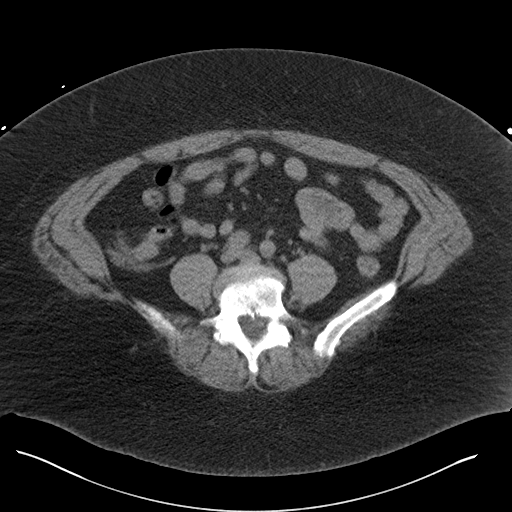
[im 48/92  soft-tissue]
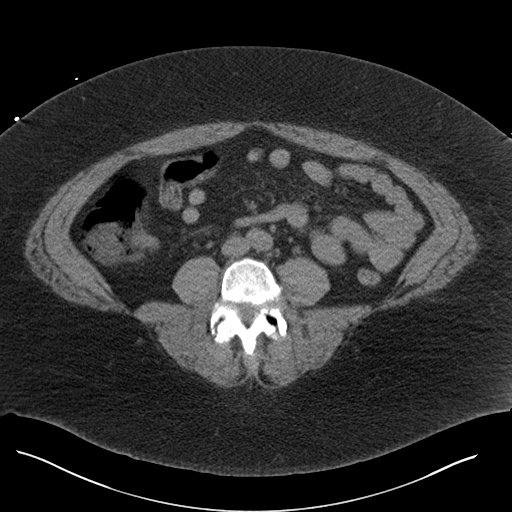
[im 57/92  soft-tissue]
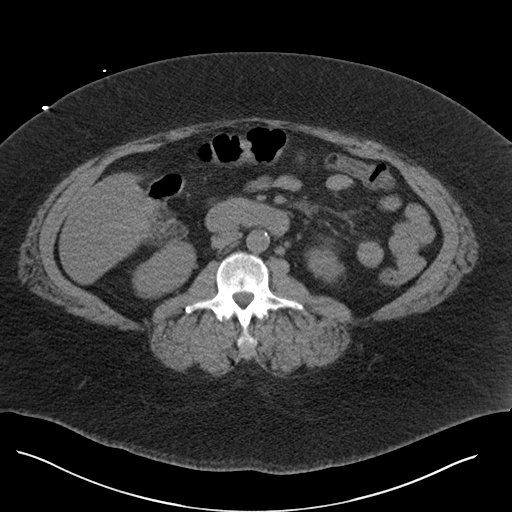
[im 57/92  bone]
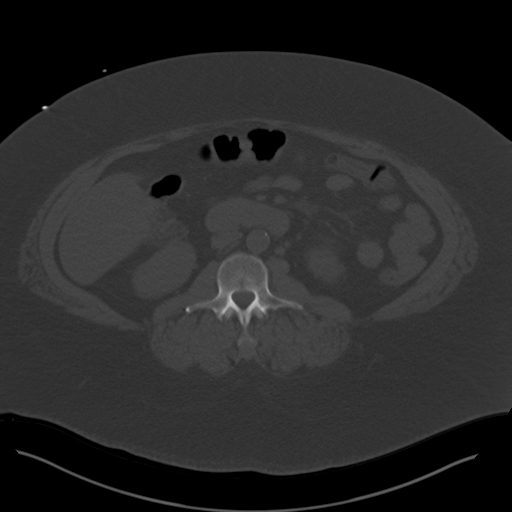
[im 61/92  soft-tissue]
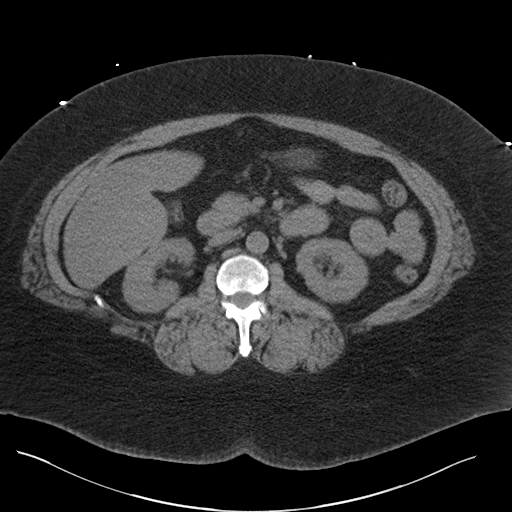
[im 70/92  soft-tissue]
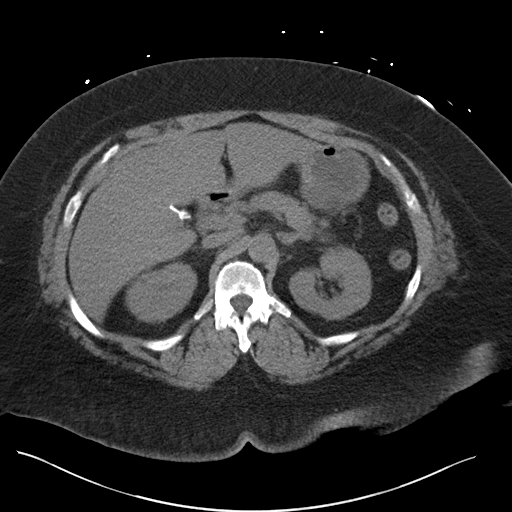
[im 74/92  soft-tissue]
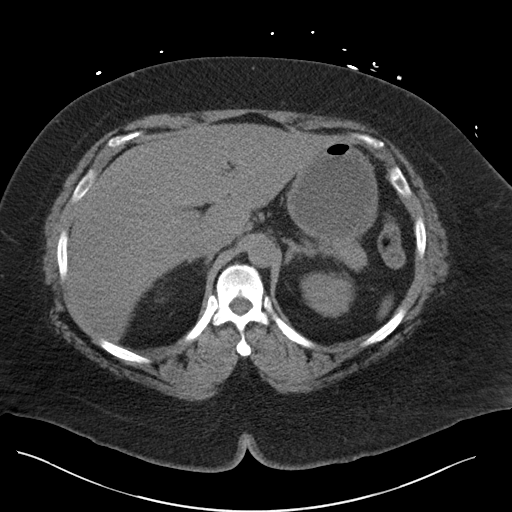
[im 79/92  soft-tissue]
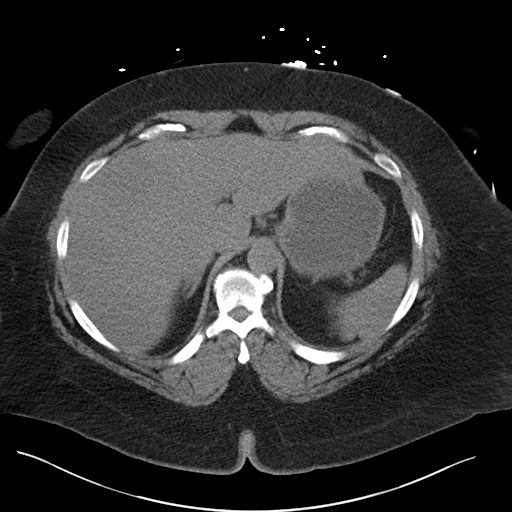
[im 87/92  soft-tissue]
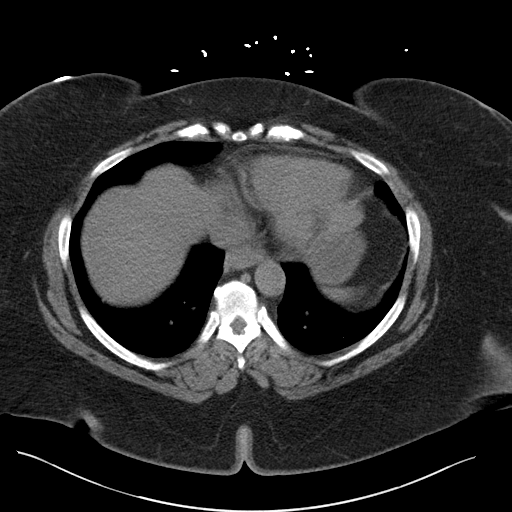

[Series 6: coronal st · coronal · 0.87mm/px · 3 of 167 slices shown]
[im 56/167  soft-tissue]
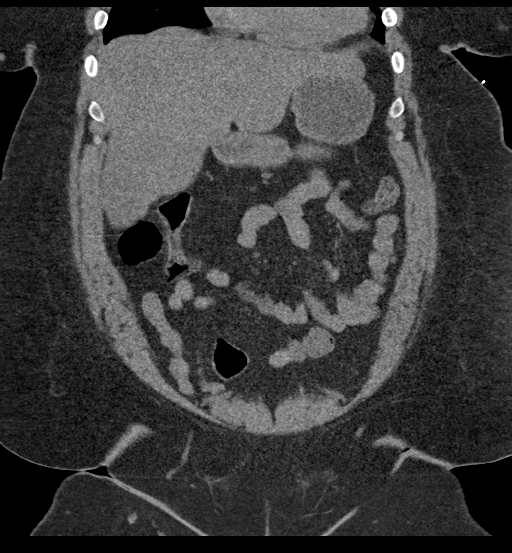
[im 74/167  soft-tissue]
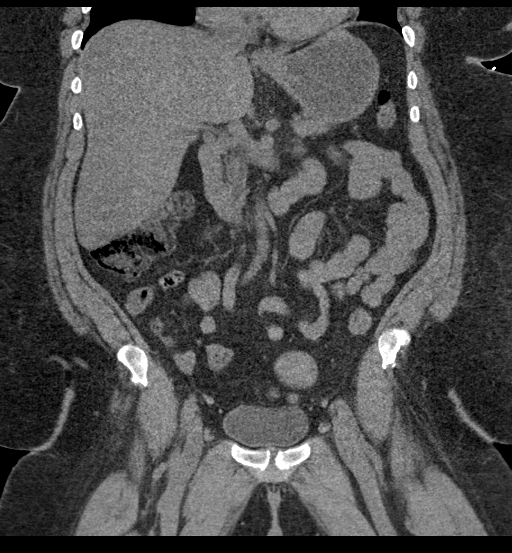
[im 93/167  soft-tissue]
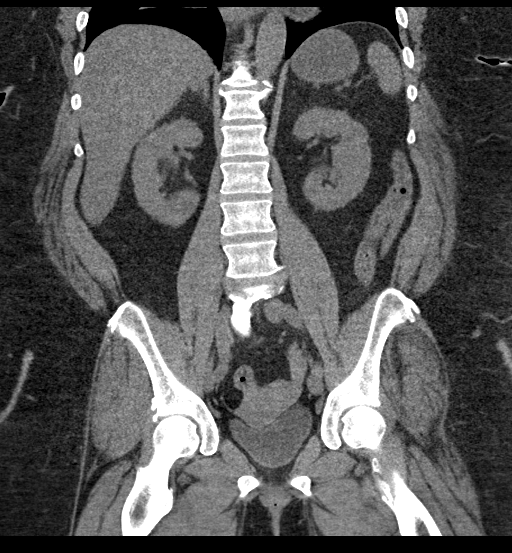

[17 of 46 positions shown; findings below may reference images not displayed]

FINDINGS: Lower chest: The lung bases are clear. The heart size is normal.

Hepatobiliary: There is decreased hepatic attenuation suggestive of
hepatic steatosis. Status post cholecystectomy.There is no biliary
ductal dilation.

Pancreas: Normal contours without ductal dilatation. No
peripancreatic fluid collection.

Spleen: Unremarkable.

Adrenals/Urinary Tract:

--Adrenal glands: Unremarkable.

--Right kidney/ureter: No hydronephrosis or radiopaque kidney
stones.

--Left kidney/ureter: No hydronephrosis or radiopaque kidney stones.

--Urinary bladder: Unremarkable.

Stomach/Bowel:

--Stomach/Duodenum: No hiatal hernia or other gastric abnormality.
Normal duodenal course and caliber.

--Small bowel: Unremarkable.

--Colon: Unremarkable.

--Appendix: Normal.

Vascular/Lymphatic: Atherosclerotic calcification is present within
the non-aneurysmal abdominal aorta, without hemodynamically
significant stenosis.

--No retroperitoneal lymphadenopathy.

--No mesenteric lymphadenopathy.

--No pelvic or inguinal lymphadenopathy.

Reproductive: A dominant uterine fibroid is again noted measuring
approximately 5.1 cm.

Other: No ascites or free air. The abdominal wall is normal.

Musculoskeletal. No acute displaced fractures.
IMPRESSION: 1. No CT findings to explain the patient's nausea and vomiting.
2. Hepatic steatosis.
3. Aortic Atherosclerosis ([id number]).

## 2021-07-19 IMAGING — DX DG CHEST 1V PORT
1 series · 1 of 1 positions shown · non-contrast
Comparison: 06/05/2019

CLINICAL DATA: [name]

EXAM:
PORTABLE CHEST 1 VIEW

[chest ap]
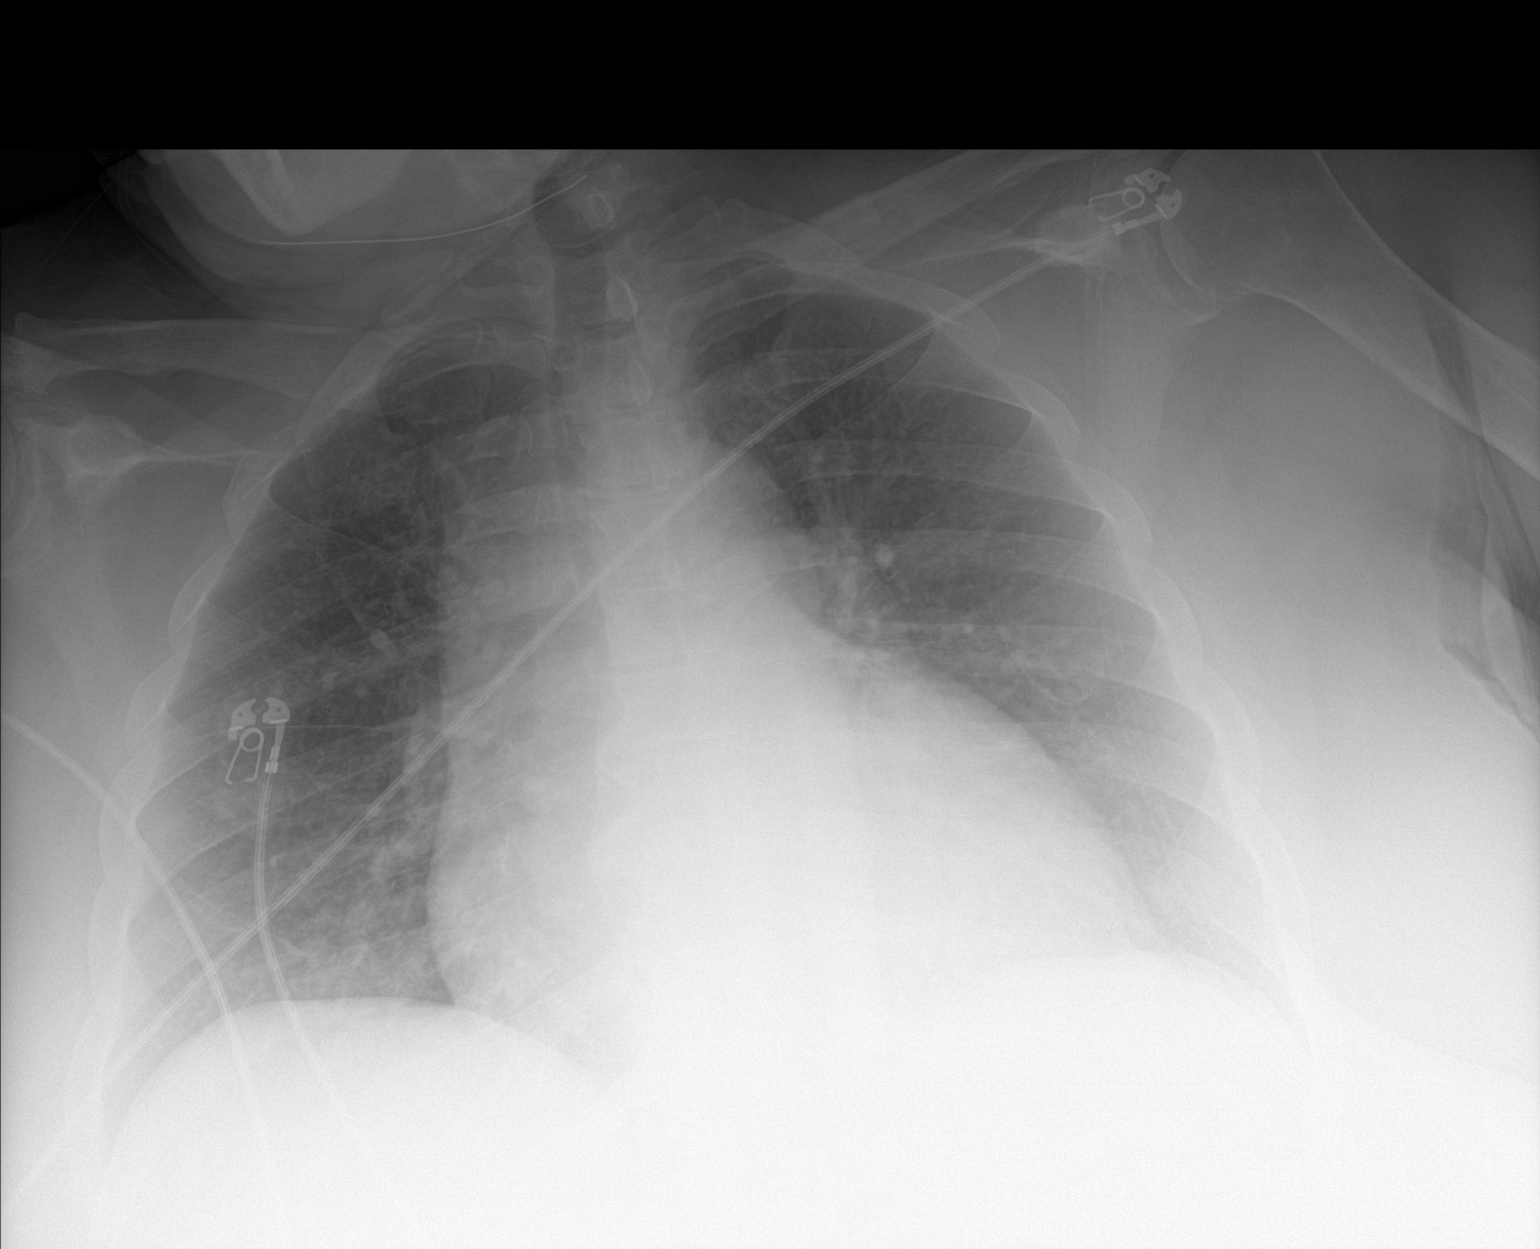

[1 of 1 positions shown; findings below may reference images not displayed]

FINDINGS: Cardiac shadow is enlarged but stable. Lungs are well aerated
bilaterally. No focal infiltrate or sizable effusion is seen.
Previously seen vascular congestion has improved. No bony
abnormality is noted.
IMPRESSION: No acute abnormality noted.

## 2021-07-27 ENCOUNTER — Other Ambulatory Visit: Payer: Self-pay | Admitting: Physical Medicine and Rehabilitation

## 2021-07-28 ENCOUNTER — Encounter: Payer: Medicare Other | Attending: Registered Nurse | Admitting: Registered Nurse

## 2021-07-28 ENCOUNTER — Encounter: Payer: Self-pay | Admitting: Registered Nurse

## 2021-07-28 VITALS — BP 103/59 | HR 90

## 2021-07-28 DIAGNOSIS — G894 Chronic pain syndrome: Secondary | ICD-10-CM | POA: Diagnosis present

## 2021-07-28 DIAGNOSIS — G893 Neoplasm related pain (acute) (chronic): Secondary | ICD-10-CM | POA: Diagnosis present

## 2021-07-28 DIAGNOSIS — M546 Pain in thoracic spine: Secondary | ICD-10-CM

## 2021-07-28 DIAGNOSIS — C7A010 Malignant carcinoid tumor of the duodenum: Secondary | ICD-10-CM | POA: Diagnosis present

## 2021-07-28 DIAGNOSIS — Z5181 Encounter for therapeutic drug level monitoring: Secondary | ICD-10-CM | POA: Diagnosis present

## 2021-07-28 DIAGNOSIS — G8929 Other chronic pain: Secondary | ICD-10-CM

## 2021-07-28 DIAGNOSIS — Z79891 Long term (current) use of opiate analgesic: Secondary | ICD-10-CM

## 2021-07-28 MED ORDER — HYDROMORPHONE HCL 4 MG PO TABS: 4.0000 mg | ORAL_TABLET | Freq: Two times a day (BID) | ORAL | 0 refills | Status: DC

## 2021-07-28 NOTE — Progress Notes (Deleted)
   Subjective:    Patient ID: Deborah [name], female    DOB: [birth year], 56 y.o.   MRN: [id number]  HPI   .cpr Review of Systems     Objective:   Physical Exam        Assessment & Plan:

## 2021-07-28 NOTE — Progress Notes (Signed)
Subjective:    Patient ID: Deborah [name], female    DOB: [birth year], 56 y.o.   MRN: [id number]  HPI: Deborah [name] is a 56 y.o. female who returns for follow up appointment for chronic pain and medication refill. She states her pain is located in her upper back and abdomen. She also reports she was having increase intensity of abdomen pain from Thursday- [name], she admits to taking extra pain pills. We reviewed the contract, she will call office if this occurs again , she ralizes if this occurs again can lead to being discharge from our office, she verbalizes understanding. The above was discussed with Dr Ranell Patrick and she agrees with plan. No early refill will be given, [name] verbalizes understanding.  She rates her pain 10.She is not following a exercise regimen, she states she only transfers herself from her wheelchair to chair in her home.    [name] Morphine equivalent is 8.00 MME.   Last UDS was Performed on 06/02/2021, it was consistent.   Pain Inventory Average Pain 10 Pain Right Now 10 My pain is sharp, stabbing, and aching  In the last 24 hours, has pain interfered with the following? General activity 10 Relation with others 10 Enjoyment of life 10 What TIME of day is your pain at its worst? morning , daytime, evening, and night Sleep (in general) Poor  Pain is worse with: walking, bending, sitting, inactivity, standing, and some activites Pain improves with: rest, medication, and injections Relief from Meds: 10  Family History  Problem Relation Age of Onset   Diabetes Mother    Diabetes Father    Heart disease Father    Diabetes Sister    Congestive Heart Failure Sister 32   Diabetes Brother    Social History   Socioeconomic History   Marital status: Married    Spouse name: Not on file   Number of children: Not on file   Years of education: Not on file   Highest education level: Not on file  Occupational History   Not on file   Tobacco Use   Smoking status: Never   Smokeless tobacco: Never  Vaping Use   Vaping Use: Never used  Substance and Sexual Activity   Alcohol use: No   Drug use: No   Sexual activity: Not Currently    Birth control/protection: None  Other Topics Concern   Not on file  Social History Narrative   ** Merged History Encounter **       Social Determinants of Health   Financial Resource Strain: Not on file  Food Insecurity: Not on file  Transportation Needs: Not on file  Physical Activity: Not on file  Stress: Not on file  Social Connections: Not on file   Past Surgical History:  Procedure Laterality Date   BIOPSY  07/27/2019   Procedure: BIOPSY;  Surgeon: Clarene Essex, MD;  [facility];  Service: Endoscopy;;   BIOPSY  07/30/2019   Procedure: BIOPSY;  Surgeon: Otis Brace, MD;  [facility];  Service: Gastroenterology;;   CATARACT EXTRACTION  01/2014   CHOLECYSTECTOMY     COLONOSCOPY WITH PROPOFOL N/A 07/30/2019   Procedure: COLONOSCOPY WITH PROPOFOL;  Surgeon: Otis Brace, MD;  [facility];  Service: Gastroenterology;  Laterality: N/A;   ESOPHAGOGASTRODUODENOSCOPY N/A 07/27/2019   Procedure: ESOPHAGOGASTRODUODENOSCOPY (EGD);  Surgeon: Clarene Essex, MD;  [facility];  Service: Endoscopy;  Laterality: N/A;   ESOPHAGOGASTRODUODENOSCOPY N/A 07/26/2020   Procedure: ESOPHAGOGASTRODUODENOSCOPY (EGD);  Surgeon:  Arta Silence, MD;  [facility];  Service: Endoscopy;  Laterality: N/A;   ESOPHAGOGASTRODUODENOSCOPY (EGD) WITH PROPOFOL N/A 08/02/2019   Procedure: ESOPHAGOGASTRODUODENOSCOPY (EGD) WITH PROPOFOL;  Surgeon: Otis Brace, MD;  [facility];  Service: Gastroenterology;  Laterality: N/A;   HEMOSTASIS CLIP PLACEMENT  08/02/2019   Procedure: HEMOSTASIS CLIP PLACEMENT;  Surgeon: Otis Brace, MD;  [facility];  Service: Gastroenterology;;   POLYPECTOMY  07/30/2019   Procedure: POLYPECTOMY;   Surgeon: Otis Brace, MD;  [facility];  Service: Gastroenterology;;   POLYPECTOMY  08/02/2019   Procedure: POLYPECTOMY;  Surgeon: Otis Brace, MD;  [facility];  Service: Gastroenterology;;   Past Surgical History:  Procedure Laterality Date   BIOPSY  07/27/2019   Procedure: BIOPSY;  Surgeon: Clarene Essex, MD;  [facility];  Service: Endoscopy;;   BIOPSY  07/30/2019   Procedure: BIOPSY;  Surgeon: Otis Brace, MD;  [facility];  Service: Gastroenterology;;   CATARACT EXTRACTION  01/2014   CHOLECYSTECTOMY     COLONOSCOPY WITH PROPOFOL N/A 07/30/2019   Procedure: COLONOSCOPY WITH PROPOFOL;  Surgeon: Otis Brace, MD;  [facility];  Service: Gastroenterology;  Laterality: N/A;   ESOPHAGOGASTRODUODENOSCOPY N/A 07/27/2019   Procedure: ESOPHAGOGASTRODUODENOSCOPY (EGD);  Surgeon: Clarene Essex, MD;  [facility];  Service: Endoscopy;  Laterality: N/A;   ESOPHAGOGASTRODUODENOSCOPY N/A 07/26/2020   Procedure: ESOPHAGOGASTRODUODENOSCOPY (EGD);  Surgeon: Arta Silence, MD;  [facility];  Service: Endoscopy;  Laterality: N/A;   ESOPHAGOGASTRODUODENOSCOPY (EGD) WITH PROPOFOL N/A 08/02/2019   Procedure: ESOPHAGOGASTRODUODENOSCOPY (EGD) WITH PROPOFOL;  Surgeon: Otis Brace, MD;  [facility];  Service: Gastroenterology;  Laterality: N/A;   HEMOSTASIS CLIP PLACEMENT  08/02/2019   Procedure: HEMOSTASIS CLIP PLACEMENT;  Surgeon: Otis Brace, MD;  [facility];  Service: Gastroenterology;;   POLYPECTOMY  07/30/2019   Procedure: POLYPECTOMY;  Surgeon: Otis Brace, MD;  [facility];  Service: Gastroenterology;;   POLYPECTOMY  08/02/2019   Procedure: POLYPECTOMY;  Surgeon: Otis Brace, MD;  [facility];  Service: Gastroenterology;;   Past Medical History:  Diagnosis Date   Acute back pain with sciatica, left    Acute back pain with sciatica, right    AKI  (acute kidney injury) ([place])    Anemia, unspecified    Cancer ([place])    Carcinoid tumor of duodenum    Chest pain with normal coronary angiography 2019   Chronic kidney disease, stage 3b ([place])    Chronic pain    Chronic systolic CHF (congestive heart failure) ([place])    Diabetes mellitus    DKA (diabetic ketoacidosis) ([place])    Drug-seeking behavior    21 hospitalizations and 14 CT a/p in 2 years for N/V and abdominal pain, demanding only IV dilaudid   Elevated troponin    chronic   Esophageal reflux    Fibromyalgia    Gastric ulcer    Gastroparesis    Gout    Hyperlipidemia    Hypertension    Hypokalemia    Hypomagnesemia    Lumbosacral stenosis    LVH (left ventricular hypertrophy)    Morbid obesity ([facility])    NICM (nonischemic cardiomyopathy) ([facility])    PAF (paroxysmal atrial fibrillation) ([place])    Stroke ([place]) 02/2011   Thrombocytosis    Vitamin B12 deficiency anemia    BP (!) 103/59 (BP Location: Left Arm, Patient Position: Standing, Cuff Size: Normal)   Pulse 90   LMP 10/10/2012   SpO2 97%   Opioid Risk Score:   Fall  Risk Score:  `1  Depression screen PHQ 2/9     06/01/2021    1:55 PM 08/01/2020    2:56 PM 06/05/2020    3:01 PM 01/04/2020    2:20 PM 11/15/2019    3:35 PM 11/14/2019    2:04 PM 10/10/2019    1:45 PM  Depression screen PHQ 2/9  Decreased Interest '1 1 1 2 3 1 3  '$ Down, Depressed, Hopeless '1 1 1 3 3 1 3  '$ PHQ - 2 Score '2 2 2 5 6 2 6  '$ Altered sleeping    '3 3  3  '$ Tired, decreased energy    '3 3  3  '$ Change in appetite    '3 3  3  '$ Feeling bad or failure about yourself     '3 3  2  '$ Trouble concentrating    '3 3  3  '$ Moving slowly or fidgety/restless    '3 3  3  '$ Suicidal thoughts    0 3  0  PHQ-9 Score    '23 27  23     '$ Review of Systems  Constitutional: Negative.   HENT: Negative.    Eyes: Negative.   Respiratory: Negative.    Cardiovascular: Negative.   Gastrointestinal: Negative.   Endocrine: Negative.   Genitourinary: Negative.   Musculoskeletal:   Positive for back pain and gait problem.  Skin: Negative.   Allergic/Immunologic: Negative.   Hematological: Negative.   Psychiatric/Behavioral: Negative.        Objective:   Physical Exam Vitals and nursing note reviewed.  Constitutional:      Appearance: Normal appearance.  Cardiovascular:     Rate and Rhythm: Normal rate and regular rhythm.     Pulses: Normal pulses.     Heart sounds: Normal heart sounds.  Pulmonary:     Effort: Pulmonary effort is normal.     Breath sounds: Normal breath sounds.  Musculoskeletal:     Cervical back: Normal range of motion and neck supple.     Comments: Normal Muscle Bulk and Muscle Testing Reveals:  Upper Extremities: Full ROM and Muscle Strength 5/5 Thoracic Hypersensitivity Lower Extremities Full ROM and Muscle Strength 5/5 Arrived in wheelchair      Skin:    General: Skin is warm and dry.  Neurological:     Mental Status: She is alert and oriented to person, place, and time.  Psychiatric:        Mood and Affect: Mood normal.        Behavior: Behavior normal.         Assessment & Plan:  1. 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 07/28/2021 2. Chronic Pain Syndrome: Refilled Dilaudid 4 mg one tablet twice a day as needed for sever pain., may take a extra tablet 10 days out of the month when pain is sever.  #70. We will continue the opioid monitoring program, this consists of regular clinic visits, examinations, urine drug screen, pill counts as well as use of [place] Controlled Substance Reporting system. A 12 month History has been reviewed on the [place] Controlled Substance Reporting System on 07/28/2021. 3. Chronic Bilateral Lower Back Pain without sciatica: Continue HEP as Tolerated. Continue to Monitor. 07/28/2021   F/U in 2 months

## 2021-07-28 NOTE — Progress Notes (Deleted)
   Subjective:    Patient ID: Deborah [name], female    DOB: [birth year], 56 y.o.   MRN: [id number]  HPI   .cpr Review of Systems     Objective:   Physical Exam        Assessment & Plan:

## 2021-08-02 ENCOUNTER — Emergency Department (HOSPITAL_COMMUNITY): Payer: Medicare Other

## 2021-08-02 ENCOUNTER — Emergency Department (HOSPITAL_COMMUNITY)
Admission: EM | Admit: 2021-08-02 | Discharge: 2021-08-03 | Disposition: A | Discharge status: Home / Self Care | Payer: Medicare Other | Attending: Emergency Medicine | Admitting: Emergency Medicine

## 2021-08-02 DIAGNOSIS — I509 Heart failure, unspecified: Secondary | ICD-10-CM | POA: Insufficient documentation

## 2021-08-02 DIAGNOSIS — N189 Chronic kidney disease, unspecified: Secondary | ICD-10-CM | POA: Diagnosis not present

## 2021-08-02 DIAGNOSIS — R000 Tachycardia, unspecified: Secondary | ICD-10-CM | POA: Insufficient documentation

## 2021-08-02 DIAGNOSIS — I130 Hypertensive heart and chronic kidney disease with heart failure and stage 1 through stage 4 chronic kidney disease, or unspecified chronic kidney disease: Secondary | ICD-10-CM | POA: Insufficient documentation

## 2021-08-02 DIAGNOSIS — R1084 Generalized abdominal pain: Secondary | ICD-10-CM | POA: Diagnosis not present

## 2021-08-02 DIAGNOSIS — E1122 Type 2 diabetes mellitus with diabetic chronic kidney disease: Secondary | ICD-10-CM | POA: Insufficient documentation

## 2021-08-02 DIAGNOSIS — R109 Unspecified abdominal pain: Secondary | ICD-10-CM | POA: Diagnosis present

## 2021-08-02 DIAGNOSIS — F419 Anxiety disorder, unspecified: Secondary | ICD-10-CM | POA: Insufficient documentation

## 2021-08-02 DIAGNOSIS — R079 Chest pain, unspecified: Secondary | ICD-10-CM | POA: Diagnosis not present

## 2021-08-02 DIAGNOSIS — R110 Nausea: Secondary | ICD-10-CM | POA: Insufficient documentation

## 2021-08-02 DIAGNOSIS — R0602 Shortness of breath: Secondary | ICD-10-CM | POA: Insufficient documentation

## 2021-08-02 LAB — CBC WITH DIFFERENTIAL/PLATELET
Abs Immature Granulocytes: 0.03 10*3/uL (ref 0.00–0.07)
Basophils Absolute: 0.1 10*3/uL (ref 0.0–0.1)
Basophils Relative: 1 %
Eosinophils Absolute: 0.2 10*3/uL (ref 0.0–0.5)
Eosinophils Relative: 2 %
HCT: 29.7 % — ABNORMAL LOW (ref 36.0–46.0)
Hemoglobin: 9 g/dL — ABNORMAL LOW (ref 12.0–15.0)
Immature Granulocytes: 0 %
Lymphocytes Relative: 25 %
Lymphs Abs: 2.4 10*3/uL (ref 0.7–4.0)
MCH: 30.1 pg (ref 26.0–34.0)
MCHC: 30.3 g/dL (ref 30.0–36.0)
MCV: 99.3 fL (ref 80.0–100.0)
Monocytes Absolute: 0.6 10*3/uL (ref 0.1–1.0)
Monocytes Relative: 6 %
Neutro Abs: 6.4 10*3/uL (ref 1.7–7.7)
Neutrophils Relative %: 66 %
Platelets: 356 10*3/uL (ref 150–400)
RBC: 2.99 MIL/uL — ABNORMAL LOW (ref 3.87–5.11)
RDW: 13.8 % (ref 11.5–15.5)
WBC: 9.6 10*3/uL (ref 4.0–10.5)
nRBC: 0 % (ref 0.0–0.2)

## 2021-08-02 LAB — COMPREHENSIVE METABOLIC PANEL
ALT: 14 U/L (ref 0–44)
AST: 18 U/L (ref 15–41)
Albumin: 3.5 g/dL (ref 3.5–5.0)
Alkaline Phosphatase: 91 U/L (ref 38–126)
Anion gap: 12 (ref 5–15)
BUN: 19 mg/dL (ref 6–20)
CO2: 16 mmol/L — ABNORMAL LOW (ref 22–32)
Calcium: 9.1 mg/dL (ref 8.9–10.3)
Chloride: 111 mmol/L (ref 98–111)
Creatinine, Ser: 2.13 mg/dL — ABNORMAL HIGH (ref 0.44–1.00)
GFR, Estimated: 27 mL/min — ABNORMAL LOW (ref >60)
Glucose, Bld: 247 mg/dL — ABNORMAL HIGH (ref 70–99)
Potassium: 3.7 mmol/L (ref 3.5–5.1)
Sodium: 139 mmol/L (ref 135–145)
Total Bilirubin: 0.8 mg/dL (ref 0.3–1.2)
Total Protein: 7.8 g/dL (ref 6.5–8.1)

## 2021-08-02 LAB — BRAIN NATRIURETIC PEPTIDE: B Natriuretic Peptide: 53 pg/mL (ref 0.0–100.0)

## 2021-08-02 LAB — TROPONIN I (HIGH SENSITIVITY)
Troponin I (High Sensitivity): 16 ng/L (ref <18)
Troponin I (High Sensitivity): 15 ng/L (ref <18)

## 2021-08-02 LAB — LIPASE, BLOOD: Lipase: 31 U/L (ref 11–51)

## 2021-08-02 LAB — MAGNESIUM: Magnesium: 1.1 mg/dL — ABNORMAL LOW (ref 1.7–2.4)

## 2021-08-02 MED ORDER — MAGNESIUM SULFATE 2 GM/50ML IV SOLN
2.0000 g | Freq: Once | INTRAVENOUS | Status: AC
  Administered 2021-08-03: 2 g via INTRAVENOUS
  Filled 2021-08-02: qty 50

## 2021-08-02 MED ORDER — HYDROMORPHONE HCL 1 MG/ML IJ SOLN
1.0000 mg | Freq: Once | INTRAMUSCULAR | Status: AC
  Administered 2021-08-02: 1 mg via INTRAVENOUS
  Filled 2021-08-02: qty 1

## 2021-08-02 MED ORDER — LACTATED RINGERS IV BOLUS
250.0000 mL | Freq: Once | INTRAVENOUS | Status: AC
  Administered 2021-08-03: 250 mL via INTRAVENOUS

## 2021-08-02 MED ORDER — HYDROMORPHONE HCL 1 MG/ML IJ SOLN: 0.5000 mg | Freq: Once | INTRAMUSCULAR | Status: DC

## 2021-08-02 MED ORDER — ONDANSETRON HCL 4 MG/2ML IJ SOLN
4.0000 mg | Freq: Once | INTRAMUSCULAR | Status: AC
Start: 2021-08-02 — End: 2021-08-02
  Administered 2021-08-02: 4 mg via INTRAVENOUS
  Filled 2021-08-02: qty 2

## 2021-08-02 NOTE — ED Provider Notes (Signed)
Care assumed from previous provider PA [facility]. Please see note for further details.  In short, patient is a 56 year old female with a history of gastroparesis, hypertension, diabetes and heart failure presenting today with abdominal pain, nausea and shortness of breath.  She is on chronic Dilaudid but ran out of her oral prescription.  She was given Dilaudid and Zofran with resolution of pain and nausea.  Continues to be tachycardic.  Plan is to give the patient IVF as well as magnesium that have already been ordered and if her heart rate comes to a normal rate, she will be discharged.  Otherwise a D-dimer will be ordered.  Of note, patient has a contrast media allergy and will require VQ scan in the event of a positive dimer.    Physical Exam  BP (!) 152/95   Pulse (!) 117   Temp 98.7 F (37.1 C) (Oral)   Resp 20   LMP 10/10/2012   SpO2 100%   Physical Exam Vitals and nursing note reviewed.  Constitutional:      Appearance: Normal appearance.  HENT:     Head: Normocephalic and atraumatic.  Eyes:     General: No scleral icterus.    Conjunctiva/sclera: Conjunctivae normal.  Pulmonary:     Effort: Pulmonary effort is normal. No respiratory distress.  Skin:    Findings: No rash.  Neurological:     Mental Status: She is alert.  Psychiatric:        Mood and Affect: Mood normal.    Procedures  Procedures  ED Course / MDM   Clinical Course as of 08/03/21 0458  Sun Aug 02, 2021  2008 Flag for prolong QT interval and zofran.  QT is normal on ekg currently.  [EH]  2047 Hemoglobin(!): 9.0 Baseline [EH]  2047 DG Chest Portable 1 View No acute abnormalities. [EH]  2111 Patient is reevaluated.  She appears much more comfortable, her breathing is slowed. [EH]  2126 CO2(!): 16 Patient has been hyperventilating [EH]  2129 Troponin I (High Sensitivity): 16 [EH]  2129 Not elevated [EH]  2130 Magnesium(!): 1.1 Slightly low, consistent with baseline [EH]  2130 Creatinine(!):  2.13 Patient with baseline from 2 months ago. [EH]  2156 Patient is reevaluated.  She has gotten a total of 2 mg of Dilaudid.  She is much more comfortable appearing now.  She still is slightly tachycardic.  Fluids have been ordered [EH]  2341 B Natriuretic Peptide: 53.0 Not consistent with CHF exacerbation [EH]  Mon Aug 03, 2021  0355 Heart rate still in 110s after 250 IVF and magnesium.  D-dimer to be drawn [MR]    Clinical Course User Index [EH] Lorin Glass, PA-C [MR] Jernie Schutt, Cecilio Asper, PA-C   Medical Decision Making Amount and/or Complexity of Data Reviewed Labs: ordered. Decision-making details documented in ED Course. Radiology: ordered. Decision-making details documented in ED Course.  Risk OTC drugs. Prescription drug management.   12:10a: Nursing staff notified me that there have been multiple attempts at starting an IV on this patient.  She is refusing ultrasound IV placement due to pain and frustration.  She is tolerating p.o. intake so I believe it is reasonable to try and orally hydrate her.  Her magnesium is 1.1 and I would prefer she get IV magnesium however we will proceed with Mag-Ox.  Per chart review, patient's baseline magnesium over the last few months has been around 1.5.   I went and spoke with the patient at bedside.  She reports that she  is willing to have another individual try for IV placement now that she knows her magnesium was low and my preference for IV replacement.  While I was at bedside, heart rate continues to be in the 120s.  She does report being in pain.  Will reorder oral pain medication for the time being   Heart rate continued to be in the 110s.  Dimer was ordered and negative.  Patient reevaluated and says she feels better and would like to go home.  This is reasonable.  Per chart review she has multiple other instances of tachycardia.  Will be given 1 final dose of oral pain medication and discharged.  She and her family are  agreeable.     Rhae Hammock, PA-C 08/03/21 [place], MD 08/03/21 [phone]

## 2021-08-02 NOTE — ED Provider Notes (Signed)
[facility] [facility] EMERGENCY DEPARTMENT Provider Note   [name]: [id number] Arrival date & time: 08/02/21  1842     History {Add pertinent medical, surgical, social history, OB history to HPI:1} Chief Complaint  Patient presents with   Anxiety    Deborah [name] is a 56 y.o. female who presents today for evaluation of chest pain, and shortness of breath. She states that she has chronic abdominal pain and ran out of her medications and her home Dilaudid a few days ago.  She states that her abdominal pain is 10 out of 10.  She reports frequent episodes of vomiting.  She states she normally has diarrhea and that is not changed from her normal baseline. She reports that about an hour prior to arrival she developed severe chest pain.  Chest pain does not radiate and is in the middle of her chest.  She states that she additionally became very short of breath at this time.  States she has baseline tardive dyskinesia.  Patient does report increased stress due to reports of a family member who died recently.  HPI     Home Medications Prior to Admission medications   Medication Sig Start Date End Date Taking? Authorizing Provider  albuterol (PROVENTIL) (2.5 MG/3ML) 0.083% nebulizer solution Take 3 mLs (2.5 mg total) by nebulization every 6 (six) hours as needed for wheezing or shortness of breath. Patient not taking: Reported on 01/31/2021 04/06/19   Scot Jun, FNP  atorvastatin (LIPITOR) 10 MG tablet TAKE ONE TABLET BY MOUTH ONCE DAILY (AM) Patient taking differently: Take 10 mg by mouth daily. 10/22/20   Alcus Dad, MD  atorvastatin (LIPITOR) 10 MG tablet Take 10 mg by mouth 1 day or 1 dose. 08/03/17   [provider]  baclofen (LIORESAL) 10 MG tablet Take 1 tablet (10 mg total) by mouth 3 (three) times daily. 02/20/21   Raulkar, Clide Deutscher, MD  blood glucose meter kit and supplies KIT Dispense based on patient and insurance preference. Use up to four times  daily as directed. (FOR ICD-9 250.00, 250.01). 12/13/18   [name], MD  cetirizine (ZYRTEC) 10 MG tablet TAKE 1 TABLET (10 MG TOTAL) BY MOUTH DAILY (AM) 06/30/21   Raulkar, Clide Deutscher, MD  DULoxetine HCl 40 MG CPEP TAKE 40 MG BY MOUTH IN THE MORNING AND AT BEDTIME. 06/30/21   Raulkar, Clide Deutscher, MD  EASY COMFORT PEN NEEDLES 31G X 5 MM MISC USE 3 TIMES A DAY FOR INSULIN ADMINISTRATION 11/14/19   Meccariello, Mel Almond J, DO  ELIQUIS 5 MG TABS tablet TAKE 1 TABLET BY MOUTH 2 (TWO) TIMES DAILY. Patient taking differently: Take 5 mg by mouth 2 (two) times daily. 12/20/19   Meccariello, Bernita Raisin, DO  famotidine (PEPCID) 40 MG tablet Take 40 mg by mouth daily. 04/07/21   [provider]  famotidine (PEPCID) 40 MG tablet Take 1 tablet by mouth daily. 04/03/21 04/03/22  [provider]  fluticasone (FLONASE) 50 MCG/ACT nasal spray Place 2 sprays into both nostrils daily as needed for allergies or rhinitis. 12/19/18   Rai, Vernelle Emerald, MD  folic acid (FOLVITE) 1 MG tablet Take 1 mg by mouth daily. 04/07/21   [provider]  hydrALAZINE (APRESOLINE) 50 MG tablet TAKE 1 TABLET (50 MG TOTAL) BY MOUTH 3 (THREE) TIMES DAILY. (AM+NOON+BEDTIME) 06/30/21   Raulkar, Clide Deutscher, MD  HYDROmorphone (DILAUDID) 4 MG tablet Take 1 tablet (4 mg total) by mouth 2 (two) times daily. May take an extra tablet when  pain is severe 10 days out of the month . Please fill on 07/31/2021 07/28/21   Bayard Hugger, NP  isosorbide mononitrate (IMDUR) 60 MG 24 hr tablet TAKE 1 TABLET (60 MG TOTAL) BY MOUTH DAILY (AM) 06/30/21   Raulkar, Clide Deutscher, MD  LANTUS SOLOSTAR 100 UNIT/ML Solostar Pen Inject 20 Units into the skin at bedtime. 04/20/21   Jennye Boroughs, MD  meclizine (ANTIVERT) 25 MG tablet Take 1 tablet (25 mg total) by mouth 3 (three) times daily as needed for dizziness. 02/20/21   Raulkar, Clide Deutscher, MD  metoCLOPramide (REGLAN) 5 MG tablet TAKE 1 TABLET (5 MG TOTAL) BY MOUTH 4 TIMES DAILY.  (AM+NOON+EVENING+BEDTIME) 07/28/21   Raulkar, Clide Deutscher, MD  metoprolol succinate (TOPROL-XL) 50 MG 24 hr tablet Take 1 tablet (50 mg total) by mouth daily. Take with or immediately following a meal. 11/24/20 04/13/21  Enzo Bi, MD  NOVOLOG FLEXPEN 100 UNIT/ML FlexPen Inject 10 Units into the skin 3 (three) times daily with meals. 04/20/21   Jennye Boroughs, MD  ondansetron (ZOFRAN ODT) 4 MG disintegrating tablet Take 1 tablet (4 mg total) by mouth every 8 (eight) hours as needed for nausea or vomiting. 05/30/21   Sherwood Gambler, MD  pantoprazole (PROTONIX) 40 MG tablet TAKE 1 TABLET (40 MG TOTAL) BY MOUTH 2 (TWO) TIMES DAILY. (AM+BEDTIME) 06/30/21   Raulkar, Clide Deutscher, MD  promethazine (PHENERGAN) 25 MG tablet Take 1 tablet (25 mg total) by mouth every 6 (six) hours as needed for nausea or vomiting. 05/30/21   Sherwood Gambler, MD  SENNA PLUS 8.6-50 MG tablet SMARTSIG:1 Tablet(s) By Mouth Morning-Night 05/19/21   [provider]  senna-docusate (SENOKOT-S) 8.6-50 MG tablet Take by mouth. 04/03/21 04/03/22  [provider]  sodium bicarbonate 650 MG tablet Take 1 tablet (650 mg total) by mouth 2 (two) times daily. 04/20/21   Jennye Boroughs, MD  spironolactone (ALDACTONE) 25 MG tablet Take 12.5 mg by mouth daily. 01/23/21   [provider]  tiZANidine (ZANAFLEX) 4 MG tablet Take 4 mg by mouth 3 (three) times daily as needed for muscle spasms. 08/11/20   [provider]  torsemide (DEMADEX) 20 MG tablet Take 40 mg by mouth at bedtime. 05/19/21   [provider]  Vitamin D, Ergocalciferol, (DRISDOL) 1.25 MG (50000 UNIT) CAPS capsule Take 50,000 Units by mouth once a week. 04/07/21   [provider]      Allergies    Diazepam, Gabapentin, Iodinated contrast media, Isovue [iopamidol], Lisinopril, Metoclopramide, Nsaids, Penicillins, Tolmetin, Cyclobenzaprine, Rifamycins, Acetaminophen, Oxycodone, and Tramadol    Review of Systems   Review of Systems See  above Physical Exam Updated Vital Signs BP (!) 147/96   Pulse (!) 108   Temp 98.7 F (37.1 C) (Oral)   Resp 18   LMP 10/10/2012   SpO2 100%  Physical Exam Vitals and nursing note reviewed.  Constitutional:      Appearance: She is ill-appearing.     Comments: Constantly moving, unable to sit still  HENT:     Head: Atraumatic.  Eyes:     Conjunctiva/sclera: Conjunctivae normal.  Cardiovascular:     Rate and Rhythm: Tachycardia present.     Pulses: Normal pulses.     Heart sounds: Normal heart sounds.  Pulmonary:     Effort: Pulmonary effort is normal. No respiratory distress.  Abdominal:     General: There is no distension.     Tenderness: There is abdominal tenderness. There is guarding.  Musculoskeletal:  Cervical back: Normal range of motion and neck supple. No rigidity.     Comments: No obvious acute injury  Skin:    General: Skin is warm and dry.  Neurological:     Mental Status: She is alert.     Comments: Awake and alert, answers all questions appropriately.  Speech is not slurred.  Constant movement changes consistent with tardive dyskinesia noted of the mouth.  Psychiatric:     Comments: Very anxious   On my initial exam clinical opioid withdrawal score/COWS score is 21  ED Results / Procedures / Treatments   Labs (all labs ordered are listed, but only abnormal results are displayed) Labs Reviewed  COMPREHENSIVE METABOLIC PANEL  LIPASE, BLOOD  CBC WITH DIFFERENTIAL/PLATELET  URINALYSIS, ROUTINE W REFLEX MICROSCOPIC  BRAIN NATRIURETIC PEPTIDE  MAGNESIUM  TROPONIN I (HIGH SENSITIVITY)    EKG EKG Interpretation  Date/Time:  Sunday Aug 02 2021 20:04:14 EDT Ventricular Rate:  118 PR Interval:  160 QRS Duration: 83 QT Interval:  323 QTC Calculation: 453 R Axis:   -16 Text Interpretation: Sinus tachycardia Ventricular premature complex Borderline left axis deviation Abnormal R-wave progression, early transition similar to Mar 2023 Confirmed by  Sherwood Gambler [id number]) on 08/02/2021 8:21:44 PM  Radiology DG Chest Portable 1 View  Result Date: 08/02/2021 CLINICAL DATA:  Chest pain with shortness of breath. EXAM: PORTABLE CHEST 1 VIEW COMPARISON:  Chest x-ray 05/30/2021. FINDINGS: The heart size and mediastinal contours are within normal limits. Both lungs are clear. The visualized skeletal structures are unremarkable. IMPRESSION: No active disease. Electronically Signed   By: Ronney Asters M.D.   On: 08/02/2021 20:11    Procedures Procedures  {Document cardiac monitor, telemetry assessment procedure when appropriate:1}  Medications Ordered in ED Medications  HYDROmorphone (DILAUDID) injection 1 mg (1 mg Intravenous Given 08/02/21 2028)  ondansetron (ZOFRAN) injection 4 mg (4 mg Intravenous Given 08/02/21 2028)    ED Course/ Medical Decision Making/ A&P Clinical Course as of 08/02/21 2042  Sun Aug 02, 2021  2008 Flag for prolong QT interval and zofran.  QT is normal on ekg currently.  [EH]    Clinical Course User Index [EH] Lorin Glass, PA-C                           Medical Decision Making Amount and/or Complexity of Data Reviewed Labs: ordered. Radiology: ordered.  Risk Prescription drug management.   ***  {Document critical care time when appropriate:1} {Document review of labs and clinical decision tools ie heart score, Chads2Vasc2 etc:1}  {Document your independent review of radiology images, and any outside records:1} {Document your discussion with family members, caretakers, and with consultants:1} {Document social determinants of health affecting pt's care:1} {Document your decision making why or why not admission, treatments were needed:1} Final Clinical Impression(s) / ED Diagnoses Final diagnoses:  None    Rx / DC Orders ED Discharge Orders     None

## 2021-08-02 NOTE — ED Triage Notes (Signed)
Patient BIB [facility] from home with complaint of anxiety, abdominal pain, and shortness of breath after a family member died recently. Patient states she has been experiencing abdominal pain for the last several days and then today started feeling short of breath. Room air SpO2 100%. Patient is alert, oriented, and in no apparent distress at this time.  BP 170/100, patient has not taken antihypertensive medications today.

## 2021-08-03 DIAGNOSIS — R1084 Generalized abdominal pain: Secondary | ICD-10-CM | POA: Diagnosis not present

## 2021-08-03 LAB — D-DIMER, QUANTITATIVE: D-Dimer, Quant: 0.47 ug/mL-FEU (ref 0.00–0.50)

## 2021-08-03 MED ORDER — HYDROCODONE-ACETAMINOPHEN 5-325 MG PO TABS
1.0000 | ORAL_TABLET | Freq: Once | ORAL | Status: DC
  Filled 2021-08-03: qty 1

## 2021-08-03 MED ORDER — MAGNESIUM OXIDE -MG SUPPLEMENT 400 (240 MG) MG PO TABS
800.0000 mg | ORAL_TABLET | Freq: Once | ORAL | Status: DC
  Filled 2021-08-03: qty 2

## 2021-08-03 MED ORDER — HYDROMORPHONE HCL 1 MG/ML IJ SOLN
1.0000 mg | Freq: Once | INTRAMUSCULAR | Status: AC
Start: 2021-08-03 — End: 2021-08-03
  Administered 2021-08-03: 1 mg via INTRAVENOUS
  Filled 2021-08-03: qty 1

## 2021-08-03 NOTE — Discharge Instructions (Addendum)
Follow up with your outpatient care team

## 2021-08-03 NOTE — ED Notes (Signed)
Medications and revaluation delayed due to unsuccessfuls of attempt. This RN missed twice.

## 2021-08-07 IMAGING — CR DG CHEST 2V
2 series · 2 of 2 positions shown · non-contrast
Comparison: 06/22/2019

CLINICAL DATA: Chest pain and shortness of breath. Diaphoresis.

EXAM:
CHEST - 2 VIEW

[x chest ap]
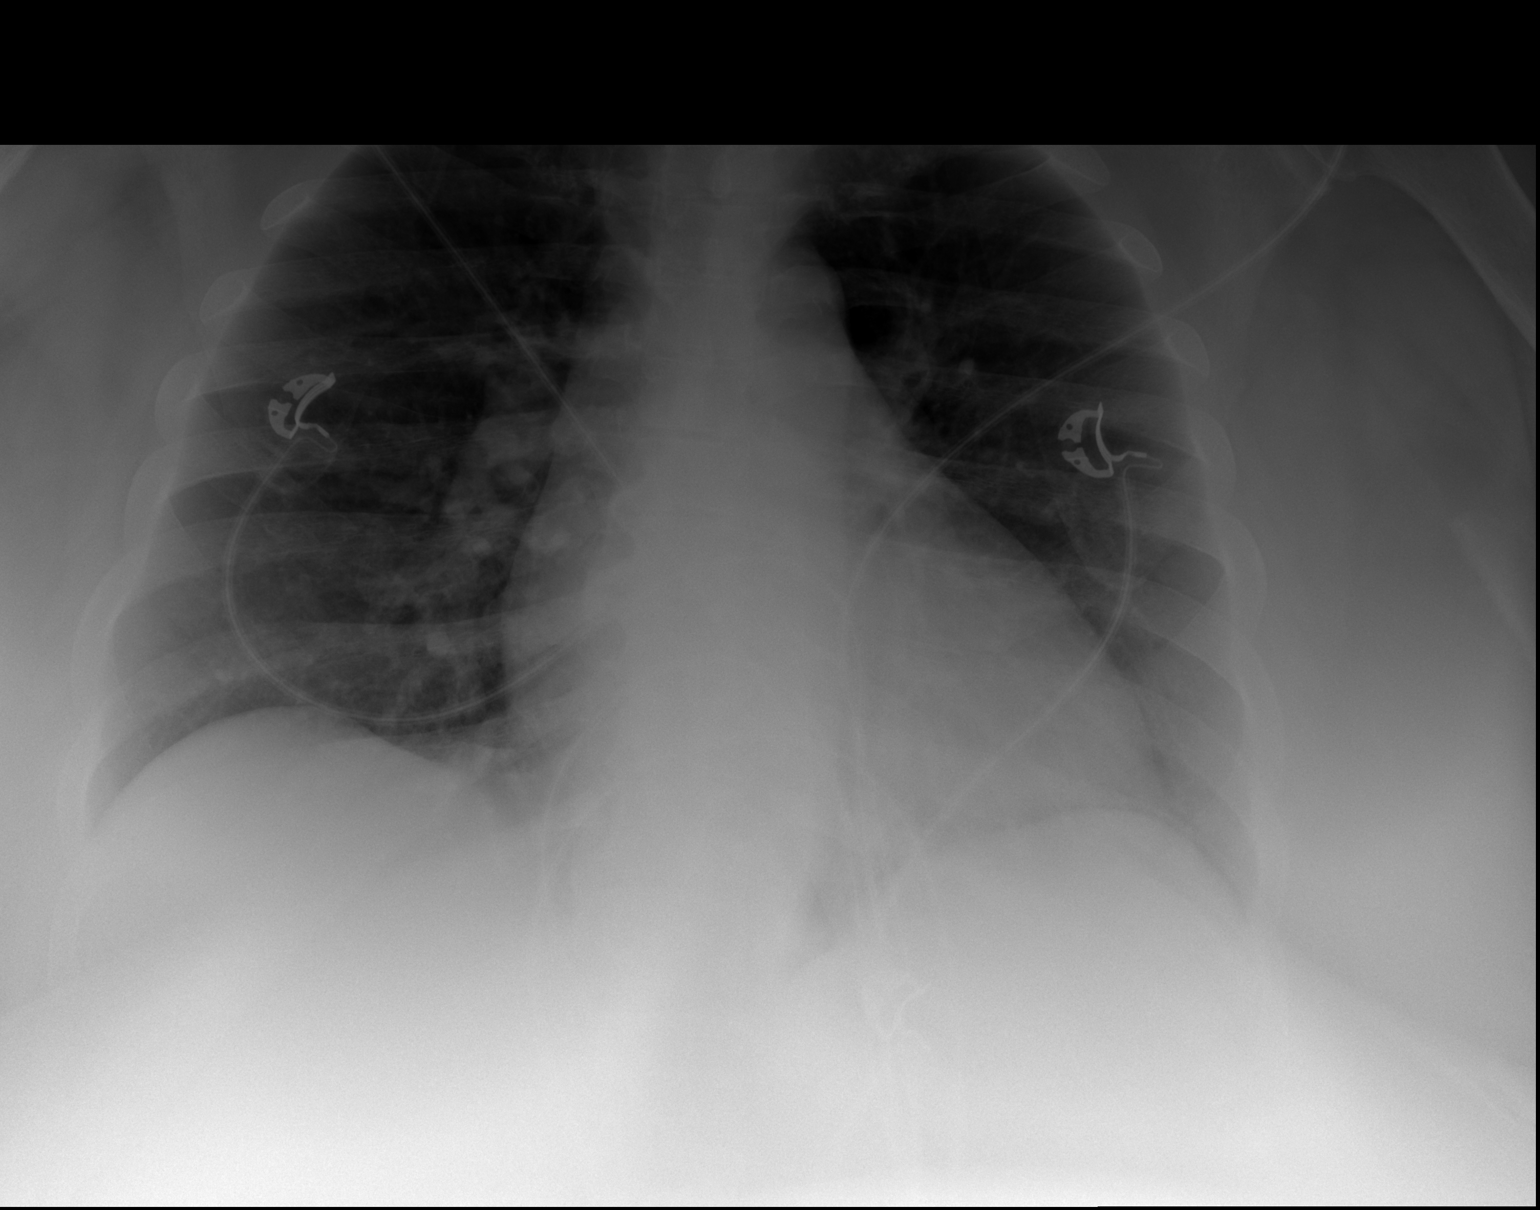

[w chest lat]
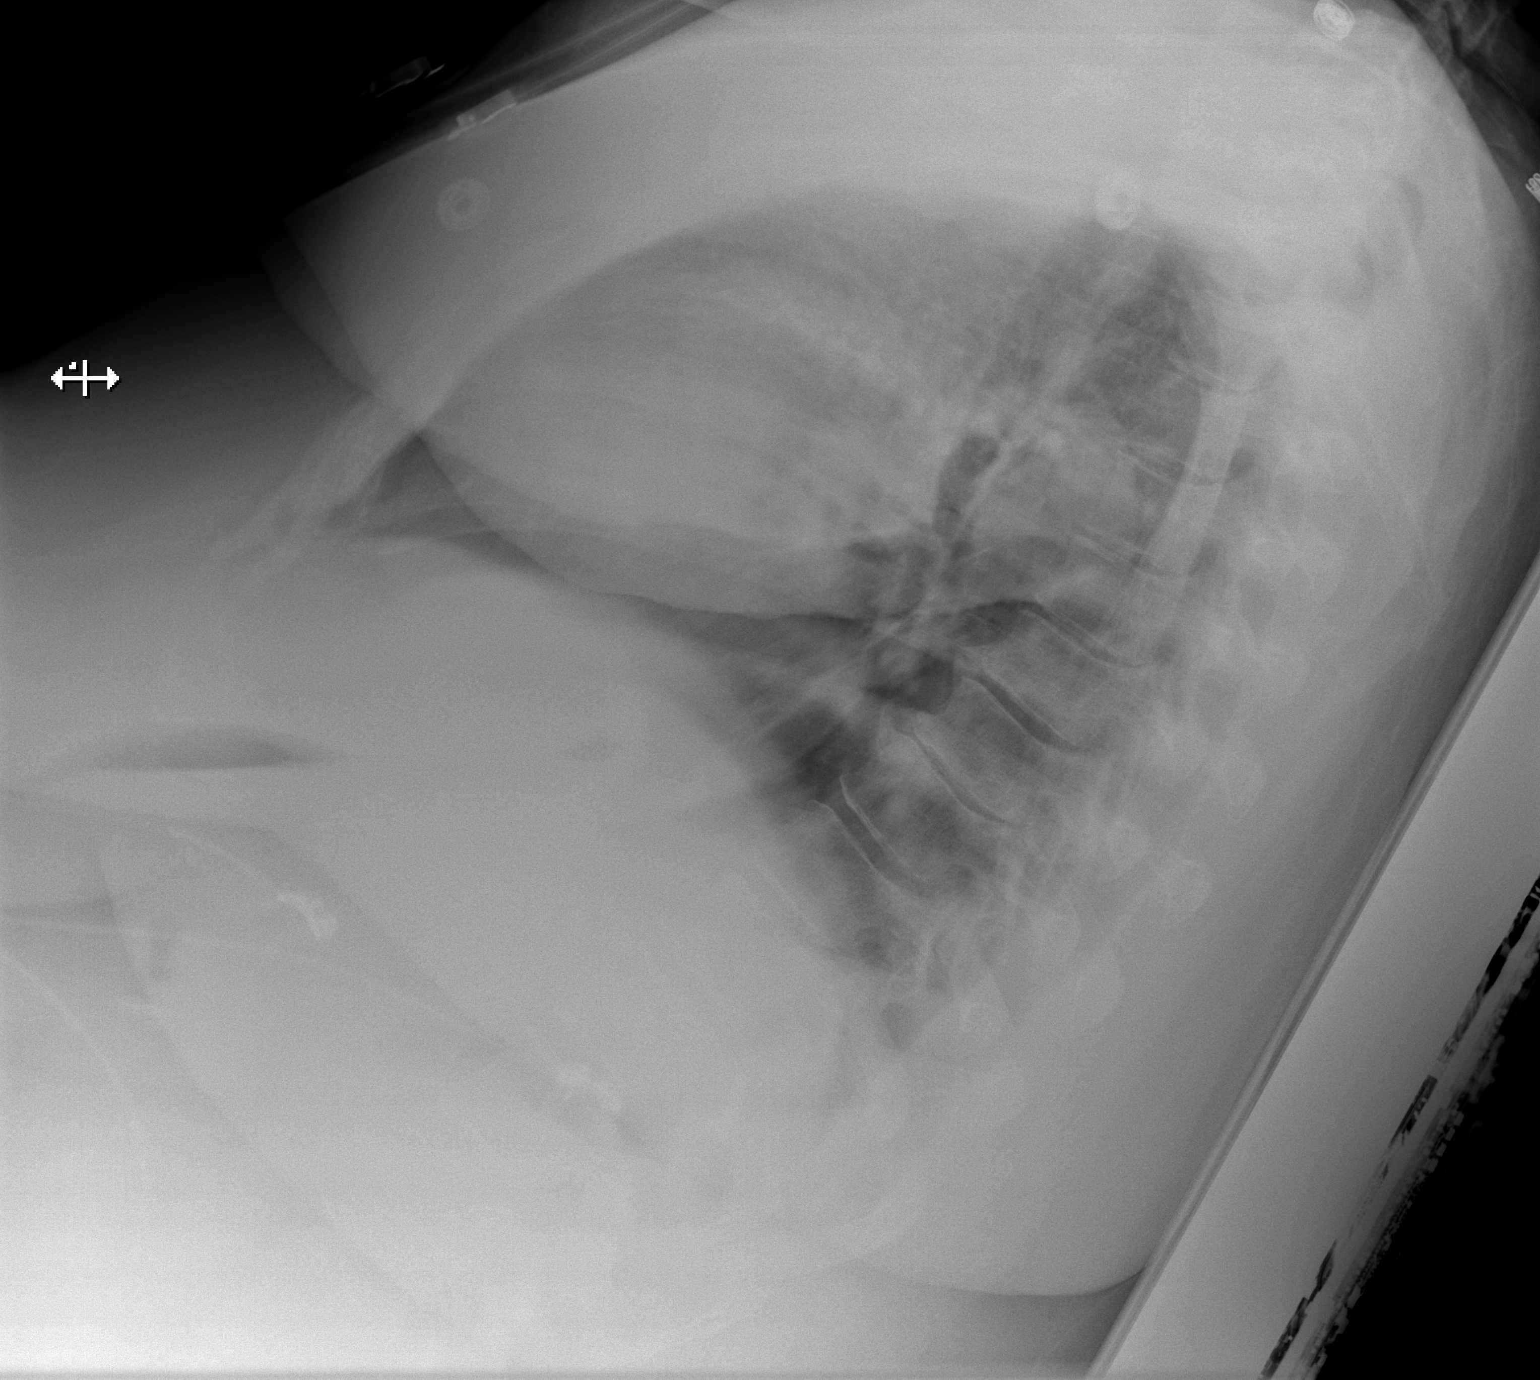

[2 of 2 positions shown; findings below may reference images not displayed]

FINDINGS: Borderline enlarged cardiac silhouette. Clear lungs with normal
vascularity. Thoracic spine degenerative changes.
IMPRESSION: Borderline cardiomegaly. No acute abnormality.

## 2021-08-10 IMAGING — DX DG CHEST 1V PORT
1 series · 1 of 1 positions shown · non-contrast
Comparison: Portable exam 5951 hours compared to 06/29/2019

CLINICAL DATA: Dyspnea, history acute on chronic heart failure,
hypertension, paroxysmal atrial fibrillation

EXAM:
PORTABLE CHEST 1 VIEW

[chest ap]
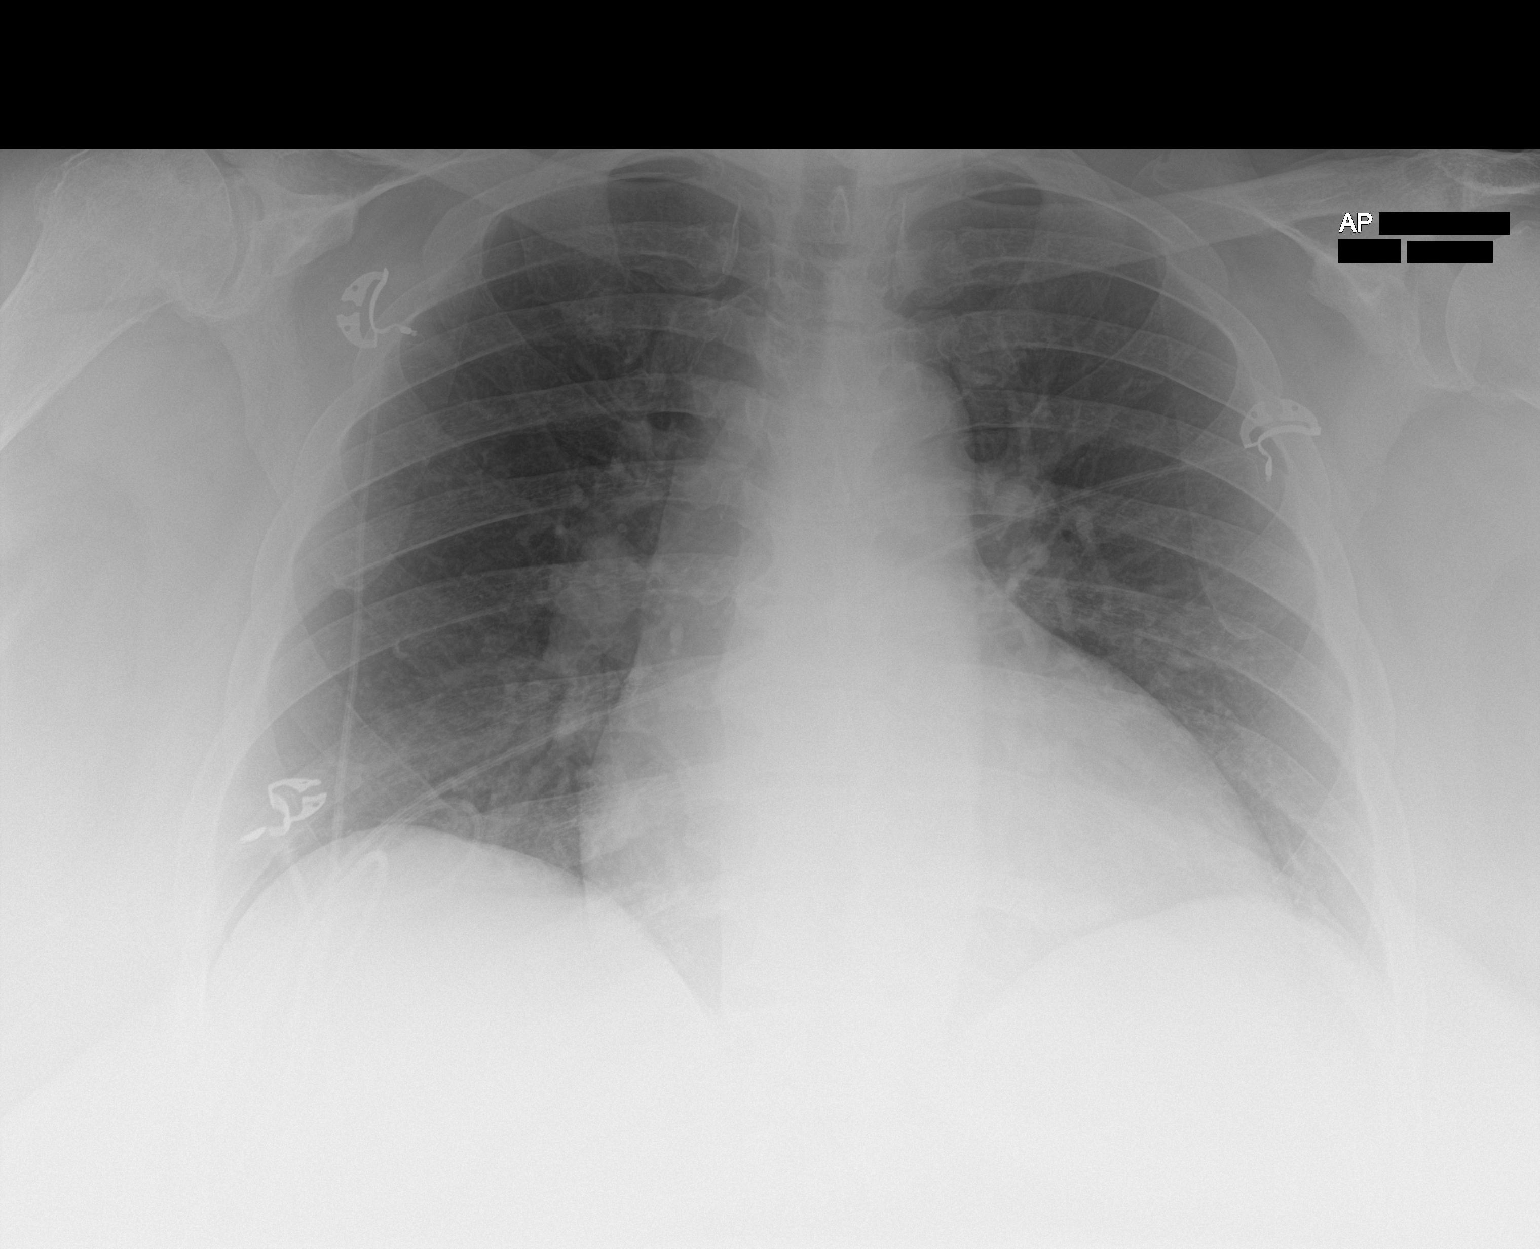

[1 of 1 positions shown; findings below may reference images not displayed]

FINDINGS: Upper normal heart size.

Mediastinal contours and pulmonary vascularity normal.

Lungs clear.

No infiltrate, pleural effusion or pneumothorax.

Bones demineralized with RIGHT glenohumeral degenerative changes
noted.
IMPRESSION: No acute abnormalities.

## 2021-08-15 ENCOUNTER — Encounter (HOSPITAL_COMMUNITY): Payer: Self-pay

## 2021-08-15 ENCOUNTER — Emergency Department (HOSPITAL_COMMUNITY): Payer: Medicare Other

## 2021-08-15 ENCOUNTER — Inpatient Hospital Stay (HOSPITAL_COMMUNITY)
Admission: EM | Admit: 2021-08-15 | Discharge: 2021-08-28 | DRG: 683 | Disposition: A | Discharge status: Home / Self Care | Payer: Medicare Other | Attending: Internal Medicine | Admitting: Internal Medicine

## 2021-08-15 ENCOUNTER — Other Ambulatory Visit: Payer: Self-pay

## 2021-08-15 DIAGNOSIS — N10 Acute tubulo-interstitial nephritis: Secondary | ICD-10-CM | POA: Diagnosis present

## 2021-08-15 DIAGNOSIS — E785 Hyperlipidemia, unspecified: Secondary | ICD-10-CM | POA: Diagnosis present

## 2021-08-15 DIAGNOSIS — K5900 Constipation, unspecified: Secondary | ICD-10-CM | POA: Diagnosis present

## 2021-08-15 DIAGNOSIS — N179 Acute kidney failure, unspecified: Secondary | ICD-10-CM | POA: Diagnosis present

## 2021-08-15 DIAGNOSIS — R109 Unspecified abdominal pain: Secondary | ICD-10-CM | POA: Diagnosis present

## 2021-08-15 DIAGNOSIS — B9620 Unspecified Escherichia coli [E. coli] as the cause of diseases classified elsewhere: Secondary | ICD-10-CM | POA: Diagnosis present

## 2021-08-15 DIAGNOSIS — E1122 Type 2 diabetes mellitus with diabetic chronic kidney disease: Secondary | ICD-10-CM | POA: Diagnosis present

## 2021-08-15 DIAGNOSIS — N184 Chronic kidney disease, stage 4 (severe): Secondary | ICD-10-CM | POA: Diagnosis present

## 2021-08-15 DIAGNOSIS — E1365 Other specified diabetes mellitus with hyperglycemia: Secondary | ICD-10-CM

## 2021-08-15 DIAGNOSIS — N390 Urinary tract infection, site not specified: Principal | ICD-10-CM

## 2021-08-15 DIAGNOSIS — Z6841 Body Mass Index (BMI) 40.0 and over, adult: Secondary | ICD-10-CM

## 2021-08-15 DIAGNOSIS — K219 Gastro-esophageal reflux disease without esophagitis: Secondary | ICD-10-CM | POA: Diagnosis present

## 2021-08-15 DIAGNOSIS — D631 Anemia in chronic kidney disease: Secondary | ICD-10-CM | POA: Diagnosis present

## 2021-08-15 DIAGNOSIS — E871 Hypo-osmolality and hyponatremia: Secondary | ICD-10-CM | POA: Diagnosis present

## 2021-08-15 DIAGNOSIS — Z8249 Family history of ischemic heart disease and other diseases of the circulatory system: Secondary | ICD-10-CM

## 2021-08-15 DIAGNOSIS — E876 Hypokalemia: Secondary | ICD-10-CM | POA: Diagnosis not present

## 2021-08-15 DIAGNOSIS — D509 Iron deficiency anemia, unspecified: Secondary | ICD-10-CM | POA: Diagnosis present

## 2021-08-15 DIAGNOSIS — M109 Gout, unspecified: Secondary | ICD-10-CM | POA: Diagnosis present

## 2021-08-15 DIAGNOSIS — Z794 Long term (current) use of insulin: Secondary | ICD-10-CM

## 2021-08-15 DIAGNOSIS — N170 Acute kidney failure with tubular necrosis: Principal | ICD-10-CM | POA: Diagnosis present

## 2021-08-15 DIAGNOSIS — Z993 Dependence on wheelchair: Secondary | ICD-10-CM

## 2021-08-15 DIAGNOSIS — G894 Chronic pain syndrome: Secondary | ICD-10-CM | POA: Diagnosis present

## 2021-08-15 DIAGNOSIS — Z8711 Personal history of peptic ulcer disease: Secondary | ICD-10-CM

## 2021-08-15 DIAGNOSIS — M797 Fibromyalgia: Secondary | ICD-10-CM | POA: Diagnosis present

## 2021-08-15 DIAGNOSIS — Z833 Family history of diabetes mellitus: Secondary | ICD-10-CM

## 2021-08-15 DIAGNOSIS — E11649 Type 2 diabetes mellitus with hypoglycemia without coma: Secondary | ICD-10-CM | POA: Diagnosis not present

## 2021-08-15 DIAGNOSIS — E872 Acidosis, unspecified: Secondary | ICD-10-CM | POA: Diagnosis present

## 2021-08-15 DIAGNOSIS — Z79891 Long term (current) use of opiate analgesic: Secondary | ICD-10-CM

## 2021-08-15 DIAGNOSIS — I428 Other cardiomyopathies: Secondary | ICD-10-CM | POA: Diagnosis present

## 2021-08-15 DIAGNOSIS — F419 Anxiety disorder, unspecified: Secondary | ICD-10-CM | POA: Diagnosis present

## 2021-08-15 DIAGNOSIS — I130 Hypertensive heart and chronic kidney disease with heart failure and stage 1 through stage 4 chronic kidney disease, or unspecified chronic kidney disease: Secondary | ICD-10-CM | POA: Diagnosis present

## 2021-08-15 DIAGNOSIS — Z91138 Patient's unintentional underdosing of medication regimen for other reason: Secondary | ICD-10-CM

## 2021-08-15 DIAGNOSIS — I480 Paroxysmal atrial fibrillation: Secondary | ICD-10-CM | POA: Diagnosis present

## 2021-08-15 DIAGNOSIS — G8929 Other chronic pain: Secondary | ICD-10-CM | POA: Diagnosis present

## 2021-08-15 DIAGNOSIS — N189 Chronic kidney disease, unspecified: Secondary | ICD-10-CM | POA: Diagnosis present

## 2021-08-15 DIAGNOSIS — T50916A Underdosing of multiple unspecified drugs, medicaments and biological substances, initial encounter: Secondary | ICD-10-CM | POA: Diagnosis present

## 2021-08-15 DIAGNOSIS — E1143 Type 2 diabetes mellitus with diabetic autonomic (poly)neuropathy: Secondary | ICD-10-CM | POA: Diagnosis present

## 2021-08-15 DIAGNOSIS — Z8673 Personal history of transient ischemic attack (TIA), and cerebral infarction without residual deficits: Secondary | ICD-10-CM

## 2021-08-15 DIAGNOSIS — Z9049 Acquired absence of other specified parts of digestive tract: Secondary | ICD-10-CM

## 2021-08-15 DIAGNOSIS — M4807 Spinal stenosis, lumbosacral region: Secondary | ICD-10-CM | POA: Diagnosis present

## 2021-08-15 DIAGNOSIS — E119 Type 2 diabetes mellitus without complications: Secondary | ICD-10-CM

## 2021-08-15 DIAGNOSIS — Z7901 Long term (current) use of anticoagulants: Secondary | ICD-10-CM

## 2021-08-15 DIAGNOSIS — I5032 Chronic diastolic (congestive) heart failure: Secondary | ICD-10-CM | POA: Diagnosis present

## 2021-08-15 DIAGNOSIS — K3184 Gastroparesis: Secondary | ICD-10-CM | POA: Diagnosis present

## 2021-08-15 DIAGNOSIS — E1165 Type 2 diabetes mellitus with hyperglycemia: Secondary | ICD-10-CM | POA: Diagnosis present

## 2021-08-15 LAB — CBC WITH DIFFERENTIAL/PLATELET
Abs Immature Granulocytes: 0.03 10*3/uL (ref 0.00–0.07)
Basophils Absolute: 0 10*3/uL (ref 0.0–0.1)
Basophils Relative: 0 %
Eosinophils Absolute: 0.3 10*3/uL (ref 0.0–0.5)
Eosinophils Relative: 3 %
HCT: 22.9 % — ABNORMAL LOW (ref 36.0–46.0)
Hemoglobin: 7.1 g/dL — ABNORMAL LOW (ref 12.0–15.0)
Immature Granulocytes: 0 %
Lymphocytes Relative: 23 %
Lymphs Abs: 2.1 10*3/uL (ref 0.7–4.0)
MCH: 30.3 pg (ref 26.0–34.0)
MCHC: 31 g/dL (ref 30.0–36.0)
MCV: 97.9 fL (ref 80.0–100.0)
Monocytes Absolute: 0.6 10*3/uL (ref 0.1–1.0)
Monocytes Relative: 7 %
Neutro Abs: 6.2 10*3/uL (ref 1.7–7.7)
Neutrophils Relative %: 67 %
Platelets: 294 10*3/uL (ref 150–400)
RBC: 2.34 MIL/uL — ABNORMAL LOW (ref 3.87–5.11)
RDW: 13 % (ref 11.5–15.5)
WBC: 9.3 10*3/uL (ref 4.0–10.5)
nRBC: 0 % (ref 0.0–0.2)

## 2021-08-15 LAB — I-STAT VENOUS BLOOD GAS, ED
Acid-base deficit: 9 mmol/L — ABNORMAL HIGH (ref 0.0–2.0)
Bicarbonate: 16.1 mmol/L — ABNORMAL LOW (ref 20.0–28.0)
Calcium, Ion: 1.16 mmol/L (ref 1.15–1.40)
HCT: 24 % — ABNORMAL LOW (ref 36.0–46.0)
Hemoglobin: 8.2 g/dL — ABNORMAL LOW (ref 12.0–15.0)
O2 Saturation: 75 %
Potassium: 5 mmol/L (ref 3.5–5.1)
Sodium: 132 mmol/L — ABNORMAL LOW (ref 135–145)
TCO2: 17 mmol/L — ABNORMAL LOW (ref 22–32)
pCO2, Ven: 30.9 mmHg — ABNORMAL LOW (ref 44–60)
pH, Ven: 7.326 (ref 7.25–7.43)
pO2, Ven: 42 mmHg (ref 32–45)

## 2021-08-15 LAB — CBG MONITORING, ED: Glucose-Capillary: 362 mg/dL — ABNORMAL HIGH (ref 70–99)

## 2021-08-15 LAB — URINALYSIS, ROUTINE W REFLEX MICROSCOPIC
Bilirubin Urine: NEGATIVE
Glucose, UA: 50 mg/dL — AB
Hgb urine dipstick: NEGATIVE
Ketones, ur: NEGATIVE mg/dL
Nitrite: NEGATIVE
Protein, ur: 100 mg/dL — AB
Specific Gravity, Urine: 1.013 (ref 1.005–1.030)
WBC, UA: >50 WBC/hpf — ABNORMAL HIGH (ref 0–5)
pH: 5 (ref 5.0–8.0)

## 2021-08-15 LAB — AMMONIA: Ammonia: 20 umol/L (ref 9–35)

## 2021-08-15 LAB — COMPREHENSIVE METABOLIC PANEL
ALT: 10 U/L (ref 0–44)
AST: 10 U/L — ABNORMAL LOW (ref 15–41)
Albumin: 3 g/dL — ABNORMAL LOW (ref 3.5–5.0)
Alkaline Phosphatase: 95 U/L (ref 38–126)
Anion gap: 12 (ref 5–15)
BUN: 61 mg/dL — ABNORMAL HIGH (ref 6–20)
CO2: 16 mmol/L — ABNORMAL LOW (ref 22–32)
Calcium: 9 mg/dL (ref 8.9–10.3)
Chloride: 100 mmol/L (ref 98–111)
Creatinine, Ser: 4.18 mg/dL — ABNORMAL HIGH (ref 0.44–1.00)
GFR, Estimated: 12 mL/min — ABNORMAL LOW (ref >60)
Glucose, Bld: 374 mg/dL — ABNORMAL HIGH (ref 70–99)
Potassium: 4.8 mmol/L (ref 3.5–5.1)
Sodium: 128 mmol/L — ABNORMAL LOW (ref 135–145)
Total Bilirubin: 0.5 mg/dL (ref 0.3–1.2)
Total Protein: 7.2 g/dL (ref 6.5–8.1)

## 2021-08-15 LAB — LIPASE, BLOOD: Lipase: 30 U/L (ref 11–51)

## 2021-08-15 LAB — BRAIN NATRIURETIC PEPTIDE: B Natriuretic Peptide: 15.1 pg/mL (ref 0.0–100.0)

## 2021-08-15 LAB — TROPONIN I (HIGH SENSITIVITY)
Troponin I (High Sensitivity): 12 ng/L (ref <18)
Troponin I (High Sensitivity): 14 ng/L (ref <18)

## 2021-08-15 MED ORDER — INSULIN ASPART 100 UNIT/ML IJ SOLN
0.0000 [IU] | Freq: Three times a day (TID) | INTRAMUSCULAR | Status: DC
  Administered 2021-08-16 (×2): 7 [IU] via SUBCUTANEOUS

## 2021-08-15 MED ORDER — SODIUM CHLORIDE 0.9 % IV SOLN
1.0000 g | Freq: Once | INTRAVENOUS | Status: AC
  Administered 2021-08-15: 1 g via INTRAVENOUS
  Filled 2021-08-15: qty 10

## 2021-08-15 MED ORDER — ENOXAPARIN SODIUM 40 MG/0.4ML IJ SOSY: 40.0000 mg | PREFILLED_SYRINGE | INTRAMUSCULAR | Status: DC

## 2021-08-15 MED ORDER — LACTATED RINGERS IV BOLUS
500.0000 mL | Freq: Once | INTRAVENOUS | Status: AC
  Administered 2021-08-16: 500 mL via INTRAVENOUS

## 2021-08-15 MED ORDER — HYDROMORPHONE HCL 1 MG/ML IJ SOLN
1.0000 mg | Freq: Once | INTRAMUSCULAR | Status: AC
  Administered 2021-08-15: 1 mg via INTRAVENOUS
  Filled 2021-08-15: qty 1

## 2021-08-15 MED ORDER — SENNOSIDES-DOCUSATE SODIUM 8.6-50 MG PO TABS: 1.0000 | ORAL_TABLET | Freq: Every evening | ORAL | Status: DC | PRN

## 2021-08-15 MED ORDER — ENOXAPARIN SODIUM 30 MG/0.3ML IJ SOSY: 30.0000 mg | PREFILLED_SYRINGE | INTRAMUSCULAR | Status: DC

## 2021-08-15 MED ORDER — LACTULOSE ENEMA
300.0000 mL | Freq: Once | ORAL | Status: DC
  Filled 2021-08-15: qty 300

## 2021-08-15 NOTE — ED Notes (Signed)
Admitting MD at bedside.

## 2021-08-15 NOTE — ED Notes (Signed)
Pt in CT.

## 2021-08-15 NOTE — ED Provider Notes (Signed)
[name] Provider Note   [name]: [id number] Arrival date & time: 08/15/21  1840     History  Chief Complaint  Patient presents with   Hyperglycemia    Deborah [name] is a 56 y.o. female.   Pt is a 56y/o female with hx of pyloromyotomy on 03/27/2021 in [place] for diabetic gastroparesis, recurrent hospitalization for chronic abdominal pain, chronic pain, stroke, DM2, HF, A fib on eliquis, CKD, CHF on torsemide (last EF 50-55% in 2022)who is presenting today for abdominal pain over the last 2 days, decreased appetite and just very sleepy overall.  She reports her abdominal pain is in the lower abdomen and on the right side.  She denies any urinary symptoms she has had no nausea vomiting or diarrhea.  She has not had any shortness of breath or cough but is noticed in the last 2 days along with the abdominal pain, increased sleepiness and poor appetite she is also had worsening swelling of her lower extremities.  She does take torsemide for the swelling in her legs but has not had a dose today.  She has not had anything to eat today.  Blood sugar earlier was in the 500s and she has not used any insulin today because she has not eaten but repeat blood sugar here was 360.  The history is provided by the patient and medical records.  Hyperglycemia      Home Medications Prior to Admission medications   Medication Sig Start Date End Date Taking? Authorizing Provider  albuterol (PROVENTIL) (2.5 MG/3ML) 0.083% nebulizer solution Take 3 mLs (2.5 mg total) by nebulization every 6 (six) hours as needed for wheezing or shortness of breath. 04/06/19   Scot Jun, FNP  atorvastatin (LIPITOR) 10 MG tablet TAKE ONE TABLET BY MOUTH ONCE DAILY (AM) Patient taking differently: Take 10 mg by mouth daily. 10/22/20   Alcus Dad, MD  baclofen (LIORESAL) 10 MG tablet Take 1 tablet (10 mg total) by mouth 3 (three) times daily. 02/20/21   Raulkar, Clide Deutscher, MD  blood glucose meter kit and supplies KIT Dispense based on patient and insurance preference. Use up to four times daily as directed. (FOR ICD-9 250.00, 250.01). 12/13/18   [name], MD  cetirizine (ZYRTEC) 10 MG tablet TAKE 1 TABLET (10 MG TOTAL) BY MOUTH DAILY (AM) Patient taking differently: Take 10 mg by mouth daily. 06/30/21   Raulkar, Clide Deutscher, MD  DULoxetine HCl 40 MG CPEP TAKE 40 MG BY MOUTH IN THE MORNING AND AT BEDTIME. 06/30/21   Raulkar, Clide Deutscher, MD  EASY COMFORT PEN NEEDLES 31G X 5 MM MISC USE 3 TIMES A DAY FOR INSULIN ADMINISTRATION 11/14/19   Meccariello, Mel Almond J, DO  ELIQUIS 5 MG TABS tablet TAKE 1 TABLET BY MOUTH 2 (TWO) TIMES DAILY. Patient taking differently: Take 5 mg by mouth 2 (two) times daily. 12/20/19   Meccariello, Bernita Raisin, DO  famotidine (PEPCID) 40 MG tablet Take 40 mg by mouth daily. 04/03/21 04/03/22  [provider]  fluticasone (FLONASE) 50 MCG/ACT nasal spray Place 2 sprays into both nostrils daily as needed for allergies or rhinitis. 12/19/18   Rai, Vernelle Emerald, MD  folic acid (FOLVITE) 1 MG tablet Take 1 mg by mouth daily. 04/07/21   [provider]  hydrALAZINE (APRESOLINE) 50 MG tablet TAKE 1 TABLET (50 MG TOTAL) BY MOUTH 3 (THREE) TIMES DAILY. (AM+NOON+BEDTIME) Patient taking differently: Take 50 mg by mouth 3 (three) times daily. 06/30/21  Raulkar, Clide Deutscher, MD  HYDROmorphone (DILAUDID) 4 MG tablet Take 1 tablet (4 mg total) by mouth 2 (two) times daily. May take an extra tablet when pain is severe 10 days out of the month . Please fill on 07/31/2021 07/28/21   Bayard Hugger, NP  isosorbide mononitrate (IMDUR) 60 MG 24 hr tablet TAKE 1 TABLET (60 MG TOTAL) BY MOUTH DAILY (AM) Patient taking differently: Take 60 mg by mouth daily. 06/30/21   Raulkar, Clide Deutscher, MD  LANTUS SOLOSTAR 100 UNIT/ML Solostar Pen Inject 20 Units into the skin at bedtime. Patient taking differently: Inject 50 Units into the skin at bedtime. 04/20/21   Jennye Boroughs, MD  meclizine (ANTIVERT) 25 MG tablet Take 1 tablet (25 mg total) by mouth 3 (three) times daily as needed for dizziness. 02/20/21   Raulkar, Clide Deutscher, MD  metoCLOPramide (REGLAN) 5 MG tablet TAKE 1 TABLET (5 MG TOTAL) BY MOUTH 4 TIMES DAILY. (AM+NOON+EVENING+BEDTIME) Patient taking differently: Take 5 mg by mouth 4 (four) times daily -  before meals and at bedtime. 07/28/21   Raulkar, Clide Deutscher, MD  metoprolol succinate (TOPROL-XL) 50 MG 24 hr tablet Take 1 tablet (50 mg total) by mouth daily. Take with or immediately following a meal. 11/24/20 08/03/21  Enzo Bi, MD  NOVOLOG FLEXPEN 100 UNIT/ML FlexPen Inject 10 Units into the skin 3 (three) times daily with meals. Patient taking differently: Inject 30 Units into the skin 4 (four) times daily -  before meals and at bedtime. 04/20/21   Jennye Boroughs, MD  ondansetron (ZOFRAN ODT) 4 MG disintegrating tablet Take 1 tablet (4 mg total) by mouth every 8 (eight) hours as needed for nausea or vomiting. 05/30/21   Sherwood Gambler, MD  pantoprazole (PROTONIX) 40 MG tablet TAKE 1 TABLET (40 MG TOTAL) BY MOUTH 2 (TWO) TIMES DAILY. (AM+BEDTIME) Patient taking differently: Take 40 mg by mouth 2 (two) times daily. 06/30/21   Raulkar, Clide Deutscher, MD  promethazine (PHENERGAN) 25 MG tablet Take 1 tablet (25 mg total) by mouth every 6 (six) hours as needed for nausea or vomiting. 05/30/21   Sherwood Gambler, MD  SENNA PLUS 8.6-50 MG tablet Take 1 tablet by mouth 2 (two) times daily. 05/19/21   [provider]  sodium bicarbonate 650 MG tablet Take 1 tablet (650 mg total) by mouth 2 (two) times daily. 04/20/21   Jennye Boroughs, MD  spironolactone (ALDACTONE) 25 MG tablet Take 12.5 mg by mouth daily. 01/23/21   [provider]  tiZANidine (ZANAFLEX) 4 MG tablet Take 4 mg by mouth 3 (three) times daily as needed for muscle spasms. 08/11/20   [provider]  torsemide (DEMADEX) 20 MG tablet Take 40 mg by mouth daily. 05/19/21   [provider]  Vitamin D, Ergocalciferol, (DRISDOL) 1.25 MG (50000 UNIT) CAPS capsule Take 50,000 Units by mouth every Monday. 04/07/21   [provider]      Allergies    Diazepam, Gabapentin, Iodinated contrast media, Isovue [iopamidol], Lisinopril, Metoclopramide, Nsaids, Penicillins, Tolmetin, Cyclobenzaprine, Rifamycins, Acetaminophen, Oxycodone, and Tramadol    Review of Systems   Review of Systems  Physical Exam Updated Vital Signs BP (!) 172/85   Pulse 99   Temp 97.9 F (36.6 C) (Oral)   Resp 14   Ht _0  (1.676 m)   Wt 113.4 kg   LMP 10/10/2012   SpO2 99%   BMI 40.35 kg/m  Physical Exam Vitals and nursing note reviewed.  Constitutional:  General: She is not in acute distress.    Appearance: Normal appearance. She is well-developed.  HENT:     Head: Normocephalic and atraumatic.     Mouth/Throat:     Mouth: Mucous membranes are moist.  Eyes:     Conjunctiva/sclera: Conjunctivae normal.     Pupils: Pupils are equal, round, and reactive to light.  Cardiovascular:     Rate and Rhythm: Normal rate and regular rhythm.     Heart sounds: No murmur heard. Pulmonary:     Effort: Pulmonary effort is normal. No respiratory distress.     Breath sounds: Normal breath sounds. No wheezing or rales.  Abdominal:     General: There is no distension.     Palpations: Abdomen is soft.     Tenderness: There is abdominal tenderness in the right lower quadrant, periumbilical area, suprapubic area and left lower quadrant. There is no right CVA tenderness, left CVA tenderness, guarding or rebound.  Musculoskeletal:        General: No tenderness. Normal range of motion.     Cervical back: Normal range of motion and neck supple.     Right lower leg: Edema present.     Left lower leg: Edema present.     Comments: 2+ pitting edema in bilateral lower extremities to the midshin  Skin:    General: Skin is warm and dry.     Findings: No erythema or rash.  Neurological:      Mental Status: She is alert and oriented to person, place, and time. Mental status is at baseline.     Motor: No weakness.  Psychiatric:        Mood and Affect: Mood normal.        Behavior: Behavior normal.     ED Results / Procedures / Treatments   Labs (all labs ordered are listed, but only abnormal results are displayed) Labs Reviewed  CBC WITH DIFFERENTIAL/PLATELET - Abnormal; Notable for the following components:      Result Value   RBC 2.34 (*)    Hemoglobin 7.1 (*)    HCT 22.9 (*)    All other components within normal limits  COMPREHENSIVE METABOLIC PANEL - Abnormal; Notable for the following components:   Sodium 128 (*)    CO2 16 (*)    Glucose, Bld 374 (*)    BUN 61 (*)    Creatinine, Ser 4.18 (*)    Albumin 3.0 (*)    AST 10 (*)    GFR, Estimated 12 (*)    All other components within normal limits  URINALYSIS, ROUTINE W REFLEX MICROSCOPIC - Abnormal; Notable for the following components:   Color, Urine AMBER (*)    APPearance TURBID (*)    Glucose, UA 50 (*)    Protein, ur 100 (*)    Leukocytes,Ua LARGE (*)    WBC, UA >50 (*)    Bacteria, UA RARE (*)    All other components within normal limits  CBG MONITORING, ED - Abnormal; Notable for the following components:   Glucose-Capillary 362 (*)    All other components within normal limits  I-STAT VENOUS BLOOD GAS, ED - Abnormal; Notable for the following components:   pCO2, Ven 30.9 (*)    Bicarbonate 16.1 (*)    TCO2 17 (*)    Acid-base deficit 9.0 (*)    Sodium 132 (*)    HCT 24.0 (*)    Hemoglobin 8.2 (*)    All other components within normal limits  URINE CULTURE  LIPASE, BLOOD  BRAIN NATRIURETIC PEPTIDE  AMMONIA  TROPONIN I (HIGH SENSITIVITY)  TROPONIN I (HIGH SENSITIVITY)    EKG EKG Interpretation  Date/Time:  Saturday August 15 2021 21:09:49 EDT Ventricular Rate:  94 PR Interval:  183 QRS Duration: 90 QT Interval:  353 QTC Calculation: 442 R Axis:   -35 Text Interpretation: Sinus rhythm  Left axis deviation Abnormal R-wave progression, late transition No significant change since last tracing Confirmed by Blanchie Dessert [phone]) on 08/15/2021 9:36:11 PM  Radiology DG Chest 1 View  Result Date: 08/15/2021 CLINICAL DATA:  Triage notes:"Pt [facility] ED via [facility] from home. Per pt she has been sleeping all day and her mom said she was out of it because she kept dropping stuff so her mom called EMS. EMS reports pt fsbs=532. Pt going to sleep during conver.*comment was [phone] EXAM: CHEST  1 VIEW COMPARISON:  None Available. FINDINGS: Normal mediastinum and cardiac silhouette. Normal pulmonary vasculature. No evidence of effusion, infiltrate, or pneumothorax. No acute bony abnormality. IMPRESSION: No acute cardiopulmonary process. Electronically Signed   By: Suzy Bouchard M.D.   On: 08/15/2021 21:02   CT ABDOMEN PELVIS WO CONTRAST  Result Date: 08/15/2021 CLINICAL DATA:  Abdominal pain, acute, nonlocalized. EXAM: CT ABDOMEN AND PELVIS WITHOUT CONTRAST TECHNIQUE: Multidetector CT imaging of the abdomen and pelvis was performed following the standard protocol without IV contrast. RADIATION DOSE REDUCTION: This exam was performed according to the departmental dose-optimization program which includes automated exposure control, adjustment of the mA and/or kV according to patient size and/or use of iterative reconstruction technique. COMPARISON:  05/30/2021. FINDINGS: Lower chest: No acute abnormality. Hepatobiliary: No focal liver abnormality is seen. Status post cholecystectomy. No biliary dilatation. Pancreas: Unremarkable. No pancreatic ductal dilatation or surrounding inflammatory changes. Spleen: Normal in size without focal abnormality. Adrenals/Urinary Tract: Adrenal glands are unremarkable. Kidneys are normal, without renal calculi, focal lesion, or hydronephrosis. Bladder is unremarkable. Stomach/Bowel: A small hiatal hernia is noted. The stomach is otherwise within normal limits.  Appendix appears normal. No evidence of bowel wall thickening, distention, or inflammatory changes. No free air or pneumatosis. Vascular/Lymphatic: Aortic atherosclerosis. No enlarged abdominal or pelvic lymph nodes by size criteria. Reproductive: Uterus and bilateral adnexa are unremarkable. Other: No abdominopelvic ascites. There is inferior displacement of the pelvic for contents below the pubococcygeal line compatible with pelvic floor dysfunction. Musculoskeletal: Degenerative changes in the thoracolumbar spine. No acute osseous abnormality. IMPRESSION: No acute intra-abdominal process. Electronically Signed   By: Brett Fairy M.D.   On: 08/15/2021 20:55    Procedures Procedures    Medications Ordered in ED Medications  cefTRIAXone (ROCEPHIN) 1 g in sodium chloride 0.9 % 100 mL IVPB (has no administration in time range)  lactated ringers bolus 500 mL (has no administration in time range)  HYDROmorphone (DILAUDID) injection 1 mg (1 mg Intravenous Given 08/15/21 2217)    ED Course/ Medical Decision Making/ A&P                           Medical Decision Making Amount and/or Complexity of Data Reviewed External Data Reviewed: notes. Labs: ordered. Decision-making details documented in ED Course. Radiology: ordered and independent interpretation performed. Decision-making details documented in ED Course. ECG/medicine tests: ordered and independent interpretation performed. Decision-making details documented in ED Course.  Risk Prescription drug management. Decision regarding hospitalization.   Pt with multiple medical problems and comorbidities and presenting today with a complaint that caries a high risk  for morbidity and mortality.  Presenting today with symptoms of elevated blood sugar, poor appetite, abdominal pain.  Denies any infectious symptoms or urinary symptoms.  Patient does have a long history of chronic abdominal pain and gastroparesis but denies any nausea or vomiting today.   She also has a history of CHF and has had worsening leg swelling but denies any chest pain or shortness of breath.  It was reported that patient was confused however she reports she does not feel confused and does not seem confused to me on exam.  She does admit to being very sleepy.  Patient does take chronic Dilaudid and baclofen but denies any changes in her doses.  She also takes torsemide but has not had her dose today.  Concern for acute infectious etiology causing her new symptoms versus exacerbation of her chronic issues versus new AKI versus CHF exacerbation.  I independently interpreted patient's EKG and labs.  Blood sugar here was 362, CBC with normal white count persistent anemia but worse than prior with hemoglobin of 7.1 from 9 a few weeks ago however she trends between 7-9, new AKI with a creatinine of 4.18 and BUN of 61 from her baseline of 2 with mild hyponatremia of 128, lipase within normal limits and UA with findings concerning for possible UTI with large leukocytes greater than 50 white blood cells but rare bacteria.  Urine culture was done.  We will do a CT to further evaluate.  Because patient appears fluid overloaded but will hold on fluids until BNP and chest x-ray return.  VBG with compensated metabolic acidosis with bicarb of 16 but pH of 7.32.  Patient is uremic today with a BUN of 61 which may be why she is so tired. I have independently visualized and interpreted pt's images today. Chest x-ray today without acute fluid overload and CT of the abdomen pelvis without evidence of renal stone or obstruction.  Etiology reports no acute intra-abdominal process.  Given patient's new AKI with creatinine doubled, possibility for UTI based on urine findings exam findings of fluid overload will admit for further care. 10:21 PM BNP wnl, ammonia wnl, urine culture done.  Pt covered with rocephin and will give a bolus of fluid based on those results.  Will admit for further  care.         Final Clinical Impression(s) / ED Diagnoses Final diagnoses:  Urinary tract infection without hematuria, site unspecified  AKI (acute kidney injury) ([place])  Other specified diabetes mellitus with hyperglycemia, with long-term current use of insulin [facility])    Rx / DC Orders ED Discharge Orders     None         Blanchie Dessert, MD 08/15/21 2221

## 2021-08-15 NOTE — ED Provider Triage Note (Signed)
Emergency Medicine Provider Triage Evaluation Note  SACHI BOULAY , a 56 y.o. female  was evaluated in triage.  Pt complains of hyperglycemia along with bilateral leg swelling for the last 2 days.  Patient reports her blood sugar at home was around the 500s, she has been eating.  She also endorses some generalized abdominal pain along with bilateral leg swelling, she is currently with furosemide and reports taking this medication without any improvement in her symptoms.  Review of Systems  Positive: Abdominal pain, nausea, leg swelling Negative: Fever, urinary symptoms.   Physical Exam  BP 130/70 (BP Location: Right Arm)   Pulse 72   Temp 97.9 F (36.6 C) (Oral)   Resp 18   Ht '5\' 6"'$  (1.676 m)   Wt 113.4 kg   LMP 10/10/2012   SpO2 97%   BMI 40.35 kg/m  Gen:   Awake, no distress   Resp:  Normal effort  MSK:   Moves extremities without difficulty  Other:  1+ pitting edema bilaterally, calves are soft.  Patient's pupils are pinpoint, peers drowsy during evaluation.  Medical Decision Making  Medically screening exam initiated at 6:55 PM.  Appropriate orders placed.  JAYDE MCALLISTER was informed that the remainder of the evaluation will be completed by another provider, this initial triage assessment does not replace that evaluation, and the importance of remaining in the ED until their evaluation is complete.  On home Dilaudid, concerns for may be taking an extra dose.  CBG checked in triage   Janeece Fitting, PA-C 08/16/21 1059

## 2021-08-15 NOTE — Hospital Course (Addendum)
[name] is a 56 y.o.female with a history of type 2 diabetes complicated by gastroparesis and neuropathy, hypertension, hyperlipidemia, paroxysmal A-fib on Eliquis, CKD stage IV, class III obesity, chronic pain syndrome on Dilaudid, history of spinal stenosis who was admitted to [facility] for AKI. Hospital course is detailed below:  AKI on CKD 4 Acute tubular necrosis Patient was admitted due to AKI suspected to be secondary to poor p.o. intake due to her acute abdominal pain.  Admission creatinine was 4.0.  Initially, patient's creatinine had begun to improve with fluid resuscitation; however, after patient began to tolerate p.o., patient's creatinine initially began to increase up to***.  Renal ultrasound showed no sign of obstruction.  Nephrology was consulted due to GFR becoming <10.  Foley catheter was placed and strict I's and O's were enforced to monitor volume status as well as urine output.  Repeat UA showed waxy casts as well as RBC in urine possibly due to traumatic Foley insertion. ***  Acute Pyelonephritis Patient's urine culture showed E. coli did report some difficulties urinating.  On abdominal CT, there was some concern regarding perinephric fluid concerning for bilateral pyelonephritis.  Given these findings, patient was started on 7-day course of antibiotics.  Patient did initially report some resolution of her abdominal pain when started on antibiotics.  Other chronic conditions were medically managed with home medications and formulary alternatives as necessary (***)

## 2021-08-15 NOTE — ED Notes (Signed)
Unable to get IV on pt - pt refusing IM meds at this time and would rather wait for IV team - order placed and MD aware

## 2021-08-15 NOTE — ED Notes (Signed)
IV team at bedside 

## 2021-08-15 NOTE — ED Triage Notes (Signed)
Pt to ED via [facility] from [facility].  Per pt she has been sleeping all day and her mom said she was out of it because she kept dropping stuff so her mom called EMS.  EMS reports pt fsbs=532.  Pt going to sleep during conversation. Pt c/o pain in abdomen.  Denies nausea and vomiting.    EMS VS: 90/46 68bpm 99% RA

## 2021-08-16 ENCOUNTER — Observation Stay (HOSPITAL_COMMUNITY): Payer: Medicare Other

## 2021-08-16 DIAGNOSIS — G8929 Other chronic pain: Secondary | ICD-10-CM | POA: Diagnosis not present

## 2021-08-16 DIAGNOSIS — N189 Chronic kidney disease, unspecified: Secondary | ICD-10-CM | POA: Diagnosis present

## 2021-08-16 DIAGNOSIS — E785 Hyperlipidemia, unspecified: Secondary | ICD-10-CM | POA: Diagnosis present

## 2021-08-16 DIAGNOSIS — E872 Acidosis, unspecified: Secondary | ICD-10-CM | POA: Diagnosis present

## 2021-08-16 DIAGNOSIS — N170 Acute kidney failure with tubular necrosis: Secondary | ICD-10-CM | POA: Diagnosis present

## 2021-08-16 DIAGNOSIS — I428 Other cardiomyopathies: Secondary | ICD-10-CM | POA: Diagnosis present

## 2021-08-16 DIAGNOSIS — I5032 Chronic diastolic (congestive) heart failure: Secondary | ICD-10-CM | POA: Diagnosis present

## 2021-08-16 DIAGNOSIS — I130 Hypertensive heart and chronic kidney disease with heart failure and stage 1 through stage 4 chronic kidney disease, or unspecified chronic kidney disease: Secondary | ICD-10-CM | POA: Diagnosis present

## 2021-08-16 DIAGNOSIS — E1143 Type 2 diabetes mellitus with diabetic autonomic (poly)neuropathy: Secondary | ICD-10-CM | POA: Diagnosis present

## 2021-08-16 DIAGNOSIS — N179 Acute kidney failure, unspecified: Secondary | ICD-10-CM | POA: Diagnosis present

## 2021-08-16 DIAGNOSIS — N10 Acute tubulo-interstitial nephritis: Secondary | ICD-10-CM | POA: Diagnosis present

## 2021-08-16 DIAGNOSIS — E1122 Type 2 diabetes mellitus with diabetic chronic kidney disease: Secondary | ICD-10-CM | POA: Diagnosis present

## 2021-08-16 DIAGNOSIS — N1832 Chronic kidney disease, stage 3b: Secondary | ICD-10-CM | POA: Diagnosis not present

## 2021-08-16 DIAGNOSIS — G894 Chronic pain syndrome: Secondary | ICD-10-CM | POA: Diagnosis present

## 2021-08-16 DIAGNOSIS — E11649 Type 2 diabetes mellitus with hypoglycemia without coma: Secondary | ICD-10-CM | POA: Diagnosis not present

## 2021-08-16 DIAGNOSIS — K5900 Constipation, unspecified: Secondary | ICD-10-CM | POA: Diagnosis present

## 2021-08-16 DIAGNOSIS — D631 Anemia in chronic kidney disease: Secondary | ICD-10-CM | POA: Diagnosis present

## 2021-08-16 DIAGNOSIS — I480 Paroxysmal atrial fibrillation: Secondary | ICD-10-CM | POA: Diagnosis present

## 2021-08-16 DIAGNOSIS — N184 Chronic kidney disease, stage 4 (severe): Secondary | ICD-10-CM | POA: Diagnosis present

## 2021-08-16 DIAGNOSIS — E1165 Type 2 diabetes mellitus with hyperglycemia: Secondary | ICD-10-CM | POA: Diagnosis not present

## 2021-08-16 DIAGNOSIS — Z6841 Body Mass Index (BMI) 40.0 and over, adult: Secondary | ICD-10-CM | POA: Diagnosis not present

## 2021-08-16 DIAGNOSIS — F419 Anxiety disorder, unspecified: Secondary | ICD-10-CM | POA: Diagnosis present

## 2021-08-16 DIAGNOSIS — E871 Hypo-osmolality and hyponatremia: Secondary | ICD-10-CM | POA: Diagnosis present

## 2021-08-16 DIAGNOSIS — R109 Unspecified abdominal pain: Secondary | ICD-10-CM | POA: Diagnosis not present

## 2021-08-16 DIAGNOSIS — B9620 Unspecified Escherichia coli [E. coli] as the cause of diseases classified elsewhere: Secondary | ICD-10-CM | POA: Diagnosis present

## 2021-08-16 DIAGNOSIS — E876 Hypokalemia: Secondary | ICD-10-CM | POA: Diagnosis not present

## 2021-08-16 DIAGNOSIS — D509 Iron deficiency anemia, unspecified: Secondary | ICD-10-CM | POA: Diagnosis present

## 2021-08-16 DIAGNOSIS — K3184 Gastroparesis: Secondary | ICD-10-CM | POA: Diagnosis present

## 2021-08-16 LAB — COMPREHENSIVE METABOLIC PANEL
ALT: 11 U/L (ref 0–44)
AST: 12 U/L — ABNORMAL LOW (ref 15–41)
Albumin: 3.2 g/dL — ABNORMAL LOW (ref 3.5–5.0)
Alkaline Phosphatase: 96 U/L (ref 38–126)
Anion gap: 13 (ref 5–15)
BUN: 56 mg/dL — ABNORMAL HIGH (ref 6–20)
CO2: 17 mmol/L — ABNORMAL LOW (ref 22–32)
Calcium: 9.5 mg/dL (ref 8.9–10.3)
Chloride: 104 mmol/L (ref 98–111)
Creatinine, Ser: 3.63 mg/dL — ABNORMAL HIGH (ref 0.44–1.00)
GFR, Estimated: 14 mL/min — ABNORMAL LOW (ref >60)
Glucose, Bld: 246 mg/dL — ABNORMAL HIGH (ref 70–99)
Potassium: 4.5 mmol/L (ref 3.5–5.1)
Sodium: 134 mmol/L — ABNORMAL LOW (ref 135–145)
Total Bilirubin: 0.4 mg/dL (ref 0.3–1.2)
Total Protein: 7.5 g/dL (ref 6.5–8.1)

## 2021-08-16 LAB — IRON AND TIBC
Iron: 53 ug/dL (ref 28–170)
Saturation Ratios: 16 % (ref 10.4–31.8)
TIBC: 325 ug/dL (ref 250–450)
UIBC: 272 ug/dL

## 2021-08-16 LAB — FERRITIN: Ferritin: 652 ng/mL — ABNORMAL HIGH (ref 11–307)

## 2021-08-16 LAB — CBC
HCT: 25 % — ABNORMAL LOW (ref 36.0–46.0)
Hemoglobin: 8.2 g/dL — ABNORMAL LOW (ref 12.0–15.0)
MCH: 31.4 pg (ref 26.0–34.0)
MCHC: 32.8 g/dL (ref 30.0–36.0)
MCV: 95.8 fL (ref 80.0–100.0)
Platelets: 305 10*3/uL (ref 150–400)
RBC: 2.61 MIL/uL — ABNORMAL LOW (ref 3.87–5.11)
RDW: 12.9 % (ref 11.5–15.5)
WBC: 11.1 10*3/uL — ABNORMAL HIGH (ref 4.0–10.5)
nRBC: 0 % (ref 0.0–0.2)

## 2021-08-16 LAB — CBG MONITORING, ED
Glucose-Capillary: 235 mg/dL — ABNORMAL HIGH (ref 70–99)
Glucose-Capillary: 270 mg/dL — ABNORMAL HIGH (ref 70–99)
Glucose-Capillary: 301 mg/dL — ABNORMAL HIGH (ref 70–99)

## 2021-08-16 LAB — BASIC METABOLIC PANEL WITH GFR
Anion gap: 12 (ref 5–15)
BUN: 53 mg/dL — ABNORMAL HIGH (ref 6–20)
CO2: 16 mmol/L — ABNORMAL LOW (ref 22–32)
Calcium: 9.2 mg/dL (ref 8.9–10.3)
Chloride: 105 mmol/L (ref 98–111)
Creatinine, Ser: 3.17 mg/dL — ABNORMAL HIGH (ref 0.44–1.00)
GFR, Estimated: 16 mL/min — ABNORMAL LOW
Glucose, Bld: 326 mg/dL — ABNORMAL HIGH (ref 70–99)
Potassium: 4.5 mmol/L (ref 3.5–5.1)
Sodium: 133 mmol/L — ABNORMAL LOW (ref 135–145)

## 2021-08-16 LAB — GLUCOSE, CAPILLARY
Glucose-Capillary: 332 mg/dL — ABNORMAL HIGH (ref 70–99)
Glucose-Capillary: 381 mg/dL — ABNORMAL HIGH (ref 70–99)

## 2021-08-16 LAB — TSH: TSH: 2.002 u[IU]/mL (ref 0.350–4.500)

## 2021-08-16 LAB — HEMOGLOBIN A1C
Hgb A1c MFr Bld: 7.5 % — ABNORMAL HIGH (ref 4.8–5.6)
Mean Plasma Glucose: 168.55 mg/dL

## 2021-08-16 LAB — BETA-HYDROXYBUTYRIC ACID: Beta-Hydroxybutyric Acid: 0.24 mmol/L (ref 0.05–0.27)

## 2021-08-16 MED ORDER — LACTATED RINGERS IV BOLUS
1000.0000 mL | Freq: Once | INTRAVENOUS | Status: AC
  Administered 2021-08-16: 1000 mL via INTRAVENOUS

## 2021-08-16 MED ORDER — ISOSORBIDE MONONITRATE ER 30 MG PO TB24: 60.0000 mg | ORAL_TABLET | Freq: Every day | ORAL | Status: DC

## 2021-08-16 MED ORDER — HYDROMORPHONE HCL 2 MG PO TABS
4.0000 mg | ORAL_TABLET | Freq: Two times a day (BID) | ORAL | Status: DC | PRN
  Filled 2021-08-16: qty 2

## 2021-08-16 MED ORDER — ISOSORBIDE MONONITRATE ER 60 MG PO TB24
60.0000 mg | ORAL_TABLET | Freq: Every day | ORAL | Status: DC
  Administered 2021-08-16 – 2021-08-22 (×7): 60 mg via ORAL
  Filled 2021-08-16 (×5): qty 1
  Filled 2021-08-16: qty 2
  Filled 2021-08-16: qty 1

## 2021-08-16 MED ORDER — ONDANSETRON 4 MG PO TBDP
4.0000 mg | ORAL_TABLET | Freq: Once | ORAL | Status: AC
Start: 2021-08-16 — End: 2021-08-16
  Administered 2021-08-16: 4 mg via ORAL
  Filled 2021-08-16: qty 1

## 2021-08-16 MED ORDER — PANTOPRAZOLE SODIUM 40 MG PO TBEC
40.0000 mg | DELAYED_RELEASE_TABLET | Freq: Every day | ORAL | Status: DC
  Administered 2021-08-16 – 2021-08-28 (×13): 40 mg via ORAL
  Filled 2021-08-16 (×13): qty 1

## 2021-08-16 MED ORDER — LIDOCAINE VISCOUS HCL 2 % MT SOLN
15.0000 mL | Freq: Once | OROMUCOSAL | Status: AC
  Administered 2021-08-16: 15 mL via ORAL
  Filled 2021-08-16: qty 15

## 2021-08-16 MED ORDER — SODIUM CHLORIDE 0.9 % IV SOLN: 1.0000 g | INTRAVENOUS | Status: DC

## 2021-08-16 MED ORDER — NALOXEGOL OXALATE 12.5 MG PO TABS
12.5000 mg | ORAL_TABLET | Freq: Every day | ORAL | Status: AC
  Administered 2021-08-16 – 2021-08-17 (×2): 12.5 mg via ORAL
  Filled 2021-08-16 (×2): qty 1

## 2021-08-16 MED ORDER — LACTATED RINGERS IV SOLN: INTRAVENOUS | Status: DC

## 2021-08-16 MED ORDER — SODIUM CHLORIDE 0.9 % IV BOLUS: 1000.0000 mL | Freq: Once | INTRAVENOUS | Status: DC

## 2021-08-16 MED ORDER — METOPROLOL SUCCINATE ER 25 MG PO TB24
50.0000 mg | ORAL_TABLET | Freq: Every day | ORAL | Status: DC
  Administered 2021-08-16 – 2021-08-22 (×7): 50 mg via ORAL
  Filled 2021-08-16 (×7): qty 2

## 2021-08-16 MED ORDER — INSULIN GLARGINE-YFGN 100 UNIT/ML ~~LOC~~ SOLN
25.0000 [IU] | Freq: Every day | SUBCUTANEOUS | Status: DC
  Administered 2021-08-16 – 2021-08-17 (×2): 25 [IU] via SUBCUTANEOUS
  Filled 2021-08-16 (×6): qty 0.25

## 2021-08-16 MED ORDER — DULOXETINE HCL 20 MG PO CPEP
40.0000 mg | ORAL_CAPSULE | Freq: Every day | ORAL | Status: DC
  Administered 2021-08-16 – 2021-08-28 (×13): 40 mg via ORAL
  Filled 2021-08-16 (×13): qty 2

## 2021-08-16 MED ORDER — METOCLOPRAMIDE HCL 5 MG PO TABS
5.0000 mg | ORAL_TABLET | Freq: Three times a day (TID) | ORAL | Status: DC
  Filled 2021-08-16: qty 1

## 2021-08-16 MED ORDER — HYDROMORPHONE HCL 1 MG/ML IJ SOLN
0.5000 mg | INTRAMUSCULAR | Status: DC | PRN
  Administered 2021-08-16 – 2021-08-17 (×9): 0.5 mg via INTRAVENOUS
  Filled 2021-08-16 (×3): qty 0.5
  Filled 2021-08-16: qty 1
  Filled 2021-08-16: qty 0.5
  Filled 2021-08-16 (×2): qty 1
  Filled 2021-08-16: qty 0.5
  Filled 2021-08-16: qty 1

## 2021-08-16 MED ORDER — HYDRALAZINE HCL 25 MG PO TABS: 50.0000 mg | ORAL_TABLET | Freq: Three times a day (TID) | ORAL | Status: DC

## 2021-08-16 MED ORDER — ALBUTEROL SULFATE (2.5 MG/3ML) 0.083% IN NEBU
3.0000 mL | INHALATION_SOLUTION | RESPIRATORY_TRACT | Status: DC | PRN
  Filled 2021-08-16: qty 3

## 2021-08-16 MED ORDER — ALUM & MAG HYDROXIDE-SIMETH 200-200-20 MG/5ML PO SUSP
30.0000 mL | Freq: Once | ORAL | Status: AC
  Administered 2021-08-16: 30 mL via ORAL
  Filled 2021-08-16: qty 30

## 2021-08-16 MED ORDER — ONDANSETRON HCL 4 MG/2ML IJ SOLN
4.0000 mg | Freq: Once | INTRAMUSCULAR | Status: DC
Start: 2021-08-16 — End: 2021-08-16

## 2021-08-16 MED ORDER — SODIUM BICARBONATE 650 MG PO TABS
650.0000 mg | ORAL_TABLET | Freq: Two times a day (BID) | ORAL | Status: DC
  Administered 2021-08-17 – 2021-08-28 (×23): 650 mg via ORAL
  Filled 2021-08-16 (×23): qty 1

## 2021-08-16 MED ORDER — APIXABAN 5 MG PO TABS
5.0000 mg | ORAL_TABLET | Freq: Two times a day (BID) | ORAL | Status: DC
  Administered 2021-08-16 – 2021-08-21 (×11): 5 mg via ORAL
  Filled 2021-08-16 (×11): qty 1

## 2021-08-16 MED ORDER — HYDRALAZINE HCL 50 MG PO TABS
50.0000 mg | ORAL_TABLET | Freq: Three times a day (TID) | ORAL | Status: DC
  Administered 2021-08-16 – 2021-08-22 (×20): 50 mg via ORAL
  Filled 2021-08-16: qty 1
  Filled 2021-08-16: qty 2
  Filled 2021-08-16 (×2): qty 1
  Filled 2021-08-16: qty 2
  Filled 2021-08-16 (×15): qty 1

## 2021-08-16 MED ORDER — HYDROMORPHONE HCL 2 MG PO TABS
4.0000 mg | ORAL_TABLET | Freq: Once | ORAL | Status: DC
  Filled 2021-08-16: qty 2

## 2021-08-16 NOTE — ED Notes (Signed)
Pt had large BM on [facility]

## 2021-08-16 NOTE — ED Notes (Signed)
Pt hit call light - on arrival to room this RN found that pt was completely naked on bedside commode and all monitoring equipment off. Pt stated that she felt like she was having an anxiety attack all of a sudden and requesting more dilaudid. This RN informed pt that she had PO dilaudid, pt refused PO dilaudid and stated that she needed IV because it worked the best and that PO would make her pain worse. This RN informed pt that she got IV dilaudid a short time ago and not due for it again, pt still refusing PO dilaudid. Pt assisted back into bed, new gown placed on pt, and pt placed back on monitor. MD Eulas Post notified at this time

## 2021-08-16 NOTE — ED Notes (Signed)
Patient's linen changed and chux applied to bed. Signaling device in reach.

## 2021-08-16 NOTE — Progress Notes (Addendum)
HD#0 Subjective:  Overnight Events: Admitted overnight   Patient seen and assessed at bedside.  Reports feeling somewhat better since having IV Dilaudid for pain control.  Patient refusing any PT/OT evaluation due to her abdominal pain as well as already having appropriate outpatient support.  Objective:  Vital signs in last 24 hours: Vitals:   08/16/21 0530 08/16/21 0601 08/16/21 0602 08/16/21 0944  BP: (!) 186/78 (!) 186/78  (!) 163/81  Pulse: (!) 124 (!) 130  97  Resp: 15   16  Temp:   98.9 F (37.2 C) 98.5 F (36.9 C)  TempSrc:   Rectal Oral  SpO2: 98%   100%  Weight:      Height:       Supplemental O2: Room Air SpO2: 100 %   Physical Exam:  Physical Exam Constitutional:      Appearance: She is obese. She is ill-appearing. She is not diaphoretic.  HENT:     Head: Normocephalic and atraumatic.  Cardiovascular:     Rate and Rhythm: Normal rate and regular rhythm.     Pulses: Normal pulses.     Heart sounds: Normal heart sounds.  Pulmonary:     Effort: Pulmonary effort is normal.     Breath sounds: Normal breath sounds.  Abdominal:     General: Abdomen is flat.     Palpations: Abdomen is soft.     Tenderness: There is abdominal tenderness.     Comments: Generalized abdominal tenderness  Skin:    General: Skin is warm and dry.     Capillary Refill: Capillary refill takes less than 2 seconds.  Neurological:     General: No focal deficit present.     Mental Status: She is oriented to person, place, and time. Mental status is at baseline.  Psychiatric:     Comments: anxious     Filed Weights   08/15/21 1852  Weight: 113.4 kg    No intake or output data in the 24 hours ending 08/16/21 1036 Net IO Since Admission: No IO data has been entered for this period [08/16/21 1036]  Pertinent Labs:    Latest Ref Rng & Units 08/16/2021    5:20 AM 08/15/2021    9:16 PM 08/15/2021    7:31 PM  CBC  WBC 4.0 - 10.5 K/uL 11.1   9.3   Hemoglobin 12.0 - 15.0 g/dL 8.2   8.2  7.1   Hematocrit 36.0 - 46.0 % 25.0  24.0  22.9   Platelets 150 - 400 K/uL 305   294        Latest Ref Rng & Units 08/16/2021    5:20 AM 08/15/2021    9:16 PM 08/15/2021    7:31 PM  CMP  Glucose 70 - 99 mg/dL 246   374   BUN 6 - 20 mg/dL 56   61   Creatinine 0.44 - 1.00 mg/dL 3.63   4.18   Sodium 135 - 145 mmol/L 134  132  128   Potassium 3.5 - 5.1 mmol/L 4.5  5.0  4.8   Chloride 98 - 111 mmol/L 104   100   CO2 22 - 32 mmol/L 17   16   Calcium 8.9 - 10.3 mg/dL 9.5   9.0   Total Protein 6.5 - 8.1 g/dL 7.5   7.2   Total Bilirubin 0.3 - 1.2 mg/dL 0.4   0.5   Alkaline Phos 38 - 126 U/L 96   95   AST 15 -  41 U/L 12   10   ALT 0 - 44 U/L 11   10     Assessment/Plan:   Principal Problem:   Acute on chronic kidney failure (HCC) Active Problems:   Type 2 diabetes mellitus with hyperglycemia, with long-term current use of insulin (HCC)   Diabetic gastroparesis (HCC)   Anxiety   Abdominal pain   Chronic pain   Patient Summary: [name] is a 56 yo F with a PMH of T2DM c/b gastroparesis, HTN, HLD, paroxysmal atrial fibrillation on chronic anticoagulation with eliquis, CKD stage 3b, class 3 obesity, chronic pain, h/o spinal stenosis mostly wheel chair bound, HFpEF presenting with abdominal pain and admitted for AKI.  #Acute on Chronic Abdominal Pain Appears multifactorial including gastroparesis, constipation, and neuropathy. Reassuring imaging and symptomatically improving with current pain regiment.  Does not appear to be infectious process at this time. -Resume home reglan -Lactulose enema -Movantik x2 -Senna nightly as needed -Pantoprazole 40 mg daily -Cymbalta 40 mg qd -IV dilaudid 0.5 mg q3 hour prn for breakthrough pain -Dilaudid p.o. 4 mg twice daily as needed for severe pain -Advance diet as tolerated today -Continue outpatient GI care in [facility] outpatient pain clinic  #AKI on CKD3b Due to dehydration in the setting of poor oral  intake. Treated with IV fluids at admission, will stop IVF today and encourage oral hydration. Creatinine downtrending from 4.18> 3.63.  Baseline appears to be around 2. Urinating well.  #HTN Last BP 163/81 -Continue to monitor -Resume home hydralazine 50 mg 3 times daily -Resume home Imdur 60 mg daily -Resume metoprolol XL 50 mg daily  #T2DM -Semglee 25 u  -SSI & CBG qac and qhs  #PAF -Continue home Eliquis  #BLE No erythema of BLE, mild swelling but very sensitive to palpation. Takes torsemide 40 mg qd at home.  -Continue to monitor -Holding torsemide in setting of AKI  #IDA #Anemia of Chronic Disease Tsat 16%. CKD likely contributory. Elevated ferritin likely secondary to  -Iron supplementation  Diet: Carb-Modified IVF: None,None VTE: Eliquis Code: Full PT/OT recs: None, none. TOC recs: none  Dispo: Anticipated discharge to Home in 1-2 days pending AKI resolution and pain management.   France Ravens, MD 08/16/2021, 10:36 AM Pager: [phone]  Please contact the on call pager after 5 pm and on weekends at [phone].

## 2021-08-16 NOTE — ED Notes (Signed)
MD Eulas Post notified of BP and HR

## 2021-08-16 NOTE — H&P (Addendum)
Date: 08/16/2021               Patient Name:  Deborah [name] MRN: [id number]  DOB: 1966/02/19 Age / Sex: 56 y.o., female   PCP: Cena Benton, MD         Medical Service: Internal Medicine Teaching Service         Attending Physician: Dr. Evette Doffing, Mallie Mussel, *    First Contact: Dr. France Ravens, MD  Pager: [phone]  Second Contact: Dr. Gaylan Gerold, DO  Pager: [phone]       After Hours (After 5p/  First Contact Pager: [phone]  weekends / holidays): Second Contact Pager: [phone]   Chief Complaint: Abdominal pain   History of Present Illness:   Deborah [name] is a 56 yo F with a PMH of T2DM c/b gastroparesis, HTN, HLD, paroxysmal atrial fibrillation on chronic anticoagulation with eliquis, CKD stage 3b, class 3 obesity, chronic pain, h/o spinal stenosis mostly wheel chair bound, HFpEF.   Patient presents after mother noticed she was sleeping more than usual and complaining of severe abdominal pain.   Patient reports that her abdominal pain began 2 days ago. She describes it as a sharp shooting pain, located along the inferior part of her abdomen. She states the pain has been intermittent. She denies association of the pain with specific movements, ingestion of certain foods, or with defecation. She denies radiation of the pain, N/V, constipation or diarrhea. She also denies polyuria or dysuria. She has had a poor appetite due to the abdominal pain and has not been able to consume many liquids or solids or take her medications.   Her last bowel movement occurred the day prior to admission and was soft and normal in amount.   Of note, Per chart review patient has multiple ED visits here and elsewhere over the last several years with a similar presentation. She has had multiple CT A/P and her symptoms are usually attributed to gastroparesis 2/2 her T2DM. She has had EGD with dilation and botox injection in the past. And recently had a pyloromyotomy 03/2021. This  was done with GI in [place]. The patient followed up with outpatient GI in [place] 06/2021. It was thought that her persistent symptoms could be 2/2 opioid induced constipation and gastroparesis. It was noted at that time that if patient did not get relief with bowel regimen that she should potentially undergo repeat EGD.   Over the last two days she notes her lower extremity edema has also worsened. She states she stopped her torsemide two days ago. She states in the past, when she misses a dose of torsemide her swelling has worsened. She denies any chest pain, shortness of breath, headache, or changes in vision.    Medications:  Patient does not recall all of her medications as she receives pill packs.  She does know that she is on eliquis, metoprolol, spironolactone, imdur, hydralazine, atorvastatin, dilaudid, reglan, lantus 50u QHS, and novolog 30u QID Patient reports she stopped her insulin about a day or two prior to admission due to poor oral intake in the setting of her abdominal pain.   PMH:  Afib On eliquis 65m BID  HFpEF (EF 50 to 55% 11/2020) HTN  HLD T2DM  PSH:  Pylorotomy 03/27/2021 S/p cholecystectomy [place] tumor intestines 12/2019  PCP  [place] [facility]   FH: Father T2DM, HTN No cancers  SH:  Never smoke No alcohol   Allergies:  multiple see chart    Code status: full   Meds:  Current Meds  Medication Sig   albuterol (PROVENTIL) (2.5 MG/3ML) 0.083% nebulizer solution Take 3 mLs (2.5 mg total) by nebulization every 6 (six) hours as needed for wheezing or shortness of breath.   atorvastatin (LIPITOR) 10 MG tablet TAKE ONE TABLET BY MOUTH ONCE DAILY (AM) (Patient taking differently: Take 10 mg by mouth daily.)   baclofen (LIORESAL) 10 MG tablet Take 1 tablet (10 mg total) by mouth 3 (three) times daily.   blood glucose meter kit and supplies KIT Dispense based on patient and insurance preference. Use up to four times daily as directed. (FOR ICD-9  250.00, 250.01).   cetirizine (ZYRTEC) 10 MG tablet TAKE 1 TABLET (10 MG TOTAL) BY MOUTH DAILY (AM) (Patient taking differently: Take 10 mg by mouth daily.)   DULoxetine HCl 40 MG CPEP TAKE 40 MG BY MOUTH IN THE MORNING AND AT BEDTIME.   EASY COMFORT PEN NEEDLES 31G X 5 MM MISC USE 3 TIMES A DAY FOR INSULIN ADMINISTRATION   ELIQUIS 5 MG TABS tablet TAKE 1 TABLET BY MOUTH 2 (TWO) TIMES DAILY. (Patient taking differently: Take 5 mg by mouth 2 (two) times daily.)   famotidine (PEPCID) 40 MG tablet Take 40 mg by mouth daily.   fluticasone (FLONASE) 50 MCG/ACT nasal spray Place 2 sprays into both nostrils daily as needed for allergies or rhinitis. (Patient taking differently: Place 1 spray into both nostrils daily.)   folic acid (FOLVITE) 1 MG tablet Take 1 mg by mouth daily.   hydrALAZINE (APRESOLINE) 50 MG tablet TAKE 1 TABLET (50 MG TOTAL) BY MOUTH 3 (THREE) TIMES DAILY. (AM+NOON+BEDTIME) (Patient taking differently: Take 50 mg by mouth 3 (three) times daily.)   HYDROmorphone (DILAUDID) 4 MG tablet Take 1 tablet (4 mg total) by mouth 2 (two) times daily. May take an extra tablet when pain is severe 10 days out of the month . Please fill on 07/31/2021   isosorbide mononitrate (IMDUR) 60 MG 24 hr tablet TAKE 1 TABLET (60 MG TOTAL) BY MOUTH DAILY (AM) (Patient taking differently: Take 60 mg by mouth daily.)   LANTUS SOLOSTAR 100 UNIT/ML Solostar Pen Inject 20 Units into the skin at bedtime. (Patient taking differently: Inject 50 Units into the skin at bedtime.)   meclizine (ANTIVERT) 25 MG tablet Take 1 tablet (25 mg total) by mouth 3 (three) times daily as needed for dizziness.   metoCLOPramide (REGLAN) 5 MG tablet TAKE 1 TABLET (5 MG TOTAL) BY MOUTH 4 TIMES DAILY. (AM+NOON+EVENING+BEDTIME) (Patient taking differently: Take 5 mg by mouth 4 (four) times daily -  before meals and at bedtime.)   metoprolol succinate (TOPROL-XL) 50 MG 24 hr tablet Take 1 tablet (50 mg total) by mouth daily. Take with or  immediately following a meal.   NOVOLOG FLEXPEN 100 UNIT/ML FlexPen Inject 10 Units into the skin 3 (three) times daily with meals. (Patient taking differently: Inject 30 Units into the skin 4 (four) times daily -  before meals and at bedtime.)   ondansetron (ZOFRAN ODT) 4 MG disintegrating tablet Take 1 tablet (4 mg total) by mouth every 8 (eight) hours as needed for nausea or vomiting.   pantoprazole (PROTONIX) 40 MG tablet TAKE 1 TABLET (40 MG TOTAL) BY MOUTH 2 (TWO) TIMES DAILY. (AM+BEDTIME) (Patient taking differently: Take 40 mg by mouth 2 (two) times daily.)   promethazine (PHENERGAN) 25 MG tablet Take 1 tablet (25 mg total) by mouth every 6 (six) hours  as needed for nausea or vomiting.   SENNA PLUS 8.6-50 MG tablet Take 1 tablet by mouth 2 (two) times daily.   sodium bicarbonate 650 MG tablet Take 1 tablet (650 mg total) by mouth 2 (two) times daily.   spironolactone (ALDACTONE) 25 MG tablet Take 12.5 mg by mouth daily.   tiZANidine (ZANAFLEX) 4 MG tablet Take 4 mg by mouth 3 (three) times daily as needed for muscle spasms.   torsemide (DEMADEX) 20 MG tablet Take 40 mg by mouth daily.   Vitamin D, Ergocalciferol, (DRISDOL) 1.25 MG (50000 UNIT) CAPS capsule Take 50,000 Units by mouth every Monday.     Allergies: Allergies as of 08/15/2021 - Review Complete 08/15/2021  Allergen Reaction Noted   Diazepam [name] 09/21/2011   Gabapentin [name] and Swelling 03/27/2017   Iodinated contrast media Anaphylaxis 11/28/2017   Isovue [iopamidol] Anaphylaxis 11/28/2017   Lisinopril Anaphylaxis 09/21/2011   Metoclopramide Other (See Comments) 08/08/2019   Nsaids Anaphylaxis and Other (See Comments) 09/21/2015   Penicillins Palpitations 02/04/2011   Tolmetin Nausea Only and Other (See Comments) 09/21/2015   Cyclobenzaprine  05/17/2020   Rifamycins Other (See Comments) 07/25/2020   Acetaminophen Nausea Only and Other (See Comments) 08/28/2015   Oxycodone Palpitations  10/09/2020   Tramadol Nausea And Vomiting 12/22/2015   Past Medical History:  Diagnosis Date   Acute back pain with sciatica, left    Acute back pain with sciatica, right    AKI (acute kidney injury) ([place])    Anemia, unspecified    Cancer (HCC)    Carcinoid tumor of duodenum    Chest pain with normal coronary angiography 2019   Chronic kidney disease, stage 3b (HCC)    Chronic pain    Chronic systolic CHF (congestive heart failure) (HCC)    Diabetes mellitus    DKA (diabetic ketoacidosis) ([place])    Drug-seeking behavior    21 hospitalizations and 14 CT a/p in 2 years for N/V and abdominal pain, demanding only IV dilaudid   Elevated troponin    chronic   Esophageal reflux    Fibromyalgia    Gastric ulcer    Gastroparesis    Gout    Hyperlipidemia    Hypertension    Hypokalemia    Hypomagnesemia    Lumbosacral stenosis    LVH (left ventricular hypertrophy)    Morbid obesity (HCC)    NICM (nonischemic cardiomyopathy) (HCC)    PAF (paroxysmal atrial fibrillation) ([place])    Stroke ([place]) 02/2011   Thrombocytosis    Vitamin B12 deficiency anemia       Review of Systems: A complete ROS was negative except as per HPI.   Physical Exam: Blood pressure (!) 167/76, pulse (!) 101, temperature 98.2 F (36.8 C), resp. rate 13, height 5' 6" (1.676 m), weight 113.4 kg, last menstrual period 10/10/2012, SpO2 98 %.  Constitutional: chronically ill appearing, and in no distress.  HENT:  Head: Normocephalic and atraumatic.  Eyes: EOM are normal.  Neck: Normal range of motion.  Cardiovascular: Tachycardic.  No gallop and no friction rub.  No murmur heard. Significant edema of BLE, mostly non pitting (trace to 1+ BLE) Pulmonary: Non labored breathing on room air, no wheezing or rales  Abdominal: Soft. Normal bowel sounds. Obese, diffuse TTP, no rebound tenderness or guarding, Non distended Musculoskeletal: Normal range of motion.     Neurological: Alert and oriented to person, place,  and time. Non focal  Skin: Skin is warm and dry.  EKG: personally reviewed my interpretation is NSR   CXR: personally reviewed my interpretation is no acute cardiopulmonary process   Assessment & Plan by Problem: Principal Problem:   Acute on chronic kidney failure (HCC)   #Abdominal Pain Unclear exact etiology of patients abdominal pain, although she reports two day onset of her current abdominal pain upon chart review it appears she has had multiple ED visits regarding this. She has a history of chronic opioid use and a pylorotomy. She most recently saw her GI doctor in [place] 06/2021. At that time it was thought that her abdominal pain was most likely due to constipation and/or her gastroparesis. However, if her abdominal pain persisted the plan was to repeat EGD.   Patient this visit has normal LFTs, lipase. CT A/P w/o contrast does show a significant  colonic stool burden but otherwise no acute intraabdominal pathology to explain her symptoms.   Will resume patient's reglan, will place order for lactulose enema in the AM, will give patient movantik x2 and reassess her symptoms, if continues to have persistent pain will reach out to GI team her.  #AKI on CKD3B Unclear of patient's baseline serum creatinine but appears to be around 2.5. This admission it is 4.0. This is likely prerenal azotemia in the setting of decreased PO intake. Urinalysis with hyaline casts which also point to prerenal etiology. Patient does have lower extremity edema but it is not pitting and overall appears volume down.  -IV fluids  -Obtain renal US, chronic opioid use could lead to post renal causes of her azotemia  -Avoid nephrotoxic agents   #BLE No erythema of her BLE, mild TTP. Patient noted to have diastolic dysfunction on most recent TTE. Likely in the setting of her HTN. She has no sores.   Likely due to diastolic dysfunction. She also likely has underlying OSA and is not currently being treated for that.   -Will hold home torsemide given her likely acute kidney injury   #NAGMA Likely due to CKD3B has sodium bicarbonate tabs on MAR though she is unclear if she is taking this daily. Will check Beta hydroxybutyrate given report of poor oral intake, BUN is also worsened but feel most likely 2/2 CKD  #Hyponatremia  Corrected for her BG, 132. Likely secondary to poor oral intake. Previously normonatremic 2 weeks ago. Will give IV fluids and recheck in the AM.   #Elevated protein gap  Mildly elevated protein gap 4.2. Also noted to have renal dysfunction and anemia.  -Consider spep/upep   #Anemia  Likely IDA  TSAT 13% 03/21/2021 and anemia of chronic disease due to kidney dysfunction. Recent EGD with no evidence of bleeding. Colonoscopy ~2 years ago with no evidence of bleeding. Denies hematochezia or melena. No hematemesis.  -Iron supplementation   #UTI No dysuria or polyuria. Does have abdominal pain although diffusely. UA with evidence of possible infection. S/p 1x dose of Ceftriaxone in the ED -Will hold on further antibiotics given abdominal pain likely not secondary to UTI and patient has no urinary symptoms.   Chronic medical problems:  T2DM: A1c 7.5. SSI + semeglee 25u    Dispo: Admit patient to Observation with expected length of stay less than 2 midnights.  Signed: Rick Duff, MD 08/16/2021, 1:21 AM  After 5pm on weekdays and 1pm on weekends: On Call pager: [phone]

## 2021-08-16 NOTE — ED Notes (Signed)
Pt tachycardic up to 150s - sustaining in 130s - pt is shivering - oral temp is 98.4 - pt refusing rectal temp - this RN explained importance of getting accurate temp - pt states "I will be okay" - MD Eulas Post notified at this time

## 2021-08-16 NOTE — ED Notes (Signed)
Pt sitting up at edge of bed - does not wish to lay down at this time - call bell within reach

## 2021-08-16 NOTE — ED Notes (Signed)
Pt ambulatory to bedside commode independently

## 2021-08-16 NOTE — ED Notes (Signed)
Lab to add on Ferritin and Iron labs

## 2021-08-16 NOTE — ED Notes (Signed)
MD Eulas Post at bedside

## 2021-08-16 NOTE — ED Notes (Signed)
Pt using BSC at this time. 

## 2021-08-16 NOTE — ED Notes (Signed)
Lab to add on beta hydroxybutyric acid

## 2021-08-16 NOTE — ED Notes (Signed)
Per MD Eulas Post give additional 500 bolus of LR

## 2021-08-17 DIAGNOSIS — N1832 Chronic kidney disease, stage 3b: Secondary | ICD-10-CM

## 2021-08-17 DIAGNOSIS — N179 Acute kidney failure, unspecified: Secondary | ICD-10-CM

## 2021-08-17 DIAGNOSIS — R109 Unspecified abdominal pain: Secondary | ICD-10-CM

## 2021-08-17 LAB — BASIC METABOLIC PANEL
Anion gap: 10 (ref 5–15)
BUN: 42 mg/dL — ABNORMAL HIGH (ref 6–20)
CO2: 19 mmol/L — ABNORMAL LOW (ref 22–32)
Calcium: 9.3 mg/dL (ref 8.9–10.3)
Chloride: 107 mmol/L (ref 98–111)
Creatinine, Ser: 2.45 mg/dL — ABNORMAL HIGH (ref 0.44–1.00)
GFR, Estimated: 22 mL/min — ABNORMAL LOW (ref >60)
Glucose, Bld: 215 mg/dL — ABNORMAL HIGH (ref 70–99)
Potassium: 4 mmol/L (ref 3.5–5.1)
Sodium: 136 mmol/L (ref 135–145)

## 2021-08-17 LAB — CBC
HCT: 23.3 % — ABNORMAL LOW (ref 36.0–46.0)
Hemoglobin: 7.4 g/dL — ABNORMAL LOW (ref 12.0–15.0)
MCH: 30.5 pg (ref 26.0–34.0)
MCHC: 31.8 g/dL (ref 30.0–36.0)
MCV: 95.9 fL (ref 80.0–100.0)
Platelets: 293 10*3/uL (ref 150–400)
RBC: 2.43 MIL/uL — ABNORMAL LOW (ref 3.87–5.11)
RDW: 12.9 % (ref 11.5–15.5)
WBC: 10.2 10*3/uL (ref 4.0–10.5)
nRBC: 0 % (ref 0.0–0.2)

## 2021-08-17 LAB — GLUCOSE, CAPILLARY
Glucose-Capillary: 193 mg/dL — ABNORMAL HIGH (ref 70–99)
Glucose-Capillary: 226 mg/dL — ABNORMAL HIGH (ref 70–99)
Glucose-Capillary: 236 mg/dL — ABNORMAL HIGH (ref 70–99)
Glucose-Capillary: 302 mg/dL — ABNORMAL HIGH (ref 70–99)
Glucose-Capillary: 344 mg/dL — ABNORMAL HIGH (ref 70–99)

## 2021-08-17 MED ORDER — INSULIN GLARGINE-YFGN 100 UNIT/ML ~~LOC~~ SOLN
28.0000 [IU] | Freq: Every day | SUBCUTANEOUS | Status: DC
  Administered 2021-08-17: 28 [IU] via SUBCUTANEOUS
  Filled 2021-08-17 (×2): qty 0.28

## 2021-08-17 MED ORDER — INSULIN ASPART 100 UNIT/ML IJ SOLN
0.0000 [IU] | Freq: Three times a day (TID) | INTRAMUSCULAR | Status: DC
  Administered 2021-08-17: 5 [IU] via SUBCUTANEOUS
  Administered 2021-08-18 (×2): 8 [IU] via SUBCUTANEOUS
  Administered 2021-08-18: 5 [IU] via SUBCUTANEOUS
  Administered 2021-08-19 – 2021-08-20 (×4): 3 [IU] via SUBCUTANEOUS
  Administered 2021-08-20: 5 [IU] via SUBCUTANEOUS
  Administered 2021-08-20: 2 [IU] via SUBCUTANEOUS
  Administered 2021-08-21 – 2021-08-22 (×4): 3 [IU] via SUBCUTANEOUS
  Administered 2021-08-22: 5 [IU] via SUBCUTANEOUS
  Administered 2021-08-23: 3 [IU] via SUBCUTANEOUS
  Administered 2021-08-23: 11 [IU] via SUBCUTANEOUS
  Administered 2021-08-24 (×2): 5 [IU] via SUBCUTANEOUS
  Administered 2021-08-26: 2 [IU] via SUBCUTANEOUS
  Administered 2021-08-26: 8 [IU] via SUBCUTANEOUS
  Administered 2021-08-26: 3 [IU] via SUBCUTANEOUS
  Administered 2021-08-27: 5 [IU] via SUBCUTANEOUS
  Administered 2021-08-27: 3 [IU] via SUBCUTANEOUS
  Administered 2021-08-28: 5 [IU] via SUBCUTANEOUS
  Administered 2021-08-28: 3 [IU] via SUBCUTANEOUS

## 2021-08-17 MED ORDER — INSULIN ASPART 100 UNIT/ML IJ SOLN
0.0000 [IU] | Freq: Three times a day (TID) | INTRAMUSCULAR | Status: DC
  Administered 2021-08-17: 4 [IU] via SUBCUTANEOUS
  Administered 2021-08-17: 15 [IU] via SUBCUTANEOUS

## 2021-08-17 MED ORDER — HYDROMORPHONE HCL 2 MG PO TABS
4.0000 mg | ORAL_TABLET | Freq: Three times a day (TID) | ORAL | Status: DC
  Administered 2021-08-17 – 2021-08-28 (×32): 4 mg via ORAL
  Filled 2021-08-17 (×33): qty 2

## 2021-08-17 MED ORDER — PROCHLORPERAZINE MALEATE 5 MG PO TABS: 5.0000 mg | ORAL_TABLET | Freq: Four times a day (QID) | ORAL | Status: DC | PRN

## 2021-08-17 MED ORDER — HYDROMORPHONE HCL 1 MG/ML IJ SOLN
0.5000 mg | INTRAMUSCULAR | Status: DC | PRN
  Administered 2021-08-17 – 2021-08-18 (×6): 0.5 mg via INTRAVENOUS
  Filled 2021-08-17 (×6): qty 0.5

## 2021-08-17 MED ORDER — INSULIN GLARGINE-YFGN 100 UNIT/ML ~~LOC~~ SOLN
35.0000 [IU] | Freq: Every day | SUBCUTANEOUS | Status: DC
  Filled 2021-08-17: qty 0.35

## 2021-08-17 MED ORDER — INSULIN ASPART 100 UNIT/ML IJ SOLN
6.0000 [IU] | Freq: Three times a day (TID) | INTRAMUSCULAR | Status: DC
  Administered 2021-08-17 – 2021-08-18 (×3): 6 [IU] via SUBCUTANEOUS

## 2021-08-17 MED ORDER — SIMETHICONE 80 MG PO CHEW
80.0000 mg | CHEWABLE_TABLET | Freq: Four times a day (QID) | ORAL | Status: DC | PRN
  Administered 2021-08-17 (×2): 80 mg via ORAL
  Filled 2021-08-17 (×2): qty 1

## 2021-08-17 MED ORDER — HYDROMORPHONE HCL 1 MG/ML IJ SOLN
1.0000 mg | Freq: Once | INTRAMUSCULAR | Status: AC
  Administered 2021-08-17: 1 mg via INTRAVENOUS
  Filled 2021-08-17: qty 1

## 2021-08-17 MED ORDER — ONDANSETRON HCL 4 MG/2ML IJ SOLN
4.0000 mg | Freq: Three times a day (TID) | INTRAMUSCULAR | Status: DC | PRN
  Administered 2021-08-17 – 2021-08-22 (×4): 4 mg via INTRAVENOUS
  Filled 2021-08-17 (×4): qty 2

## 2021-08-17 NOTE — Progress Notes (Addendum)
HD#1 Subjective:  Overnight Events: NAEON   Patient seen and assessed at bedside. Reports abdominal pain is starting to improve. Discussed spacing out IV dilaudid and scheduling oral dilaudid to see if would be better for pain control. Also added simethicone for flatulence per patient request  Objective:  Vital signs in last 24 hours: Vitals:   08/16/21 1618 08/16/21 1948 08/17/21 0014 08/17/21 0500  BP: (!) 166/96 (!) 187/103 (!) 184/88 137/78  Pulse: 99 (!) 102 (!) 110 (!) 108  Resp: '16 18  17  '$ Temp: 98.3 F (36.8 C) 98.3 F (36.8 C) 97.6 F (36.4 C) 97.9 F (36.6 C)  TempSrc: Oral Oral Oral Oral  SpO2: 100% 98% 100% 99%  Weight:      Height:       Supplemental O2: Room Air SpO2: 99 %   Physical Exam:  Physical Exam Constitutional:      Appearance: She is obese. She is ill-appearing. She is not diaphoretic.  HENT:     Head: Normocephalic and atraumatic.  Cardiovascular:     Rate and Rhythm: Normal rate and regular rhythm.     Pulses: Normal pulses.     Heart sounds: Normal heart sounds.  Pulmonary:     Effort: Pulmonary effort is normal.     Breath sounds: Normal breath sounds.  Abdominal:     General: Abdomen is flat.     Palpations: Abdomen is soft.     Tenderness: There is abdominal tenderness.     Comments: Generalized abdominal tenderness  Skin:    General: Skin is warm and dry.     Capillary Refill: Capillary refill takes less than 2 seconds.  Neurological:     General: No focal deficit present.     Mental Status: She is oriented to person, place, and time. Mental status is at baseline.  Psychiatric:     Comments: anxious     Filed Weights   08/15/21 1852  Weight: 113.4 kg     Intake/Output Summary (Last 24 hours) at 08/17/2021 0708 Last data filed at 08/17/2021 0600 Gross per 24 hour  Intake 2000 ml  Output --  Net 2000 ml   Net IO Since Admission: 2,000 mL [08/17/21 0708]  Pertinent Labs:    Latest Ref Rng & Units 08/16/2021     5:20 AM 08/15/2021    9:16 PM 08/15/2021    7:31 PM  CBC  WBC 4.0 - 10.5 K/uL 11.1   9.3   Hemoglobin 12.0 - 15.0 g/dL 8.2  8.2  7.1   Hematocrit 36.0 - 46.0 % 25.0  24.0  22.9   Platelets 150 - 400 K/uL 305   294        Latest Ref Rng & Units 08/16/2021   12:17 PM 08/16/2021    5:20 AM 08/15/2021    9:16 PM  CMP  Glucose 70 - 99 mg/dL 326  246    BUN 6 - 20 mg/dL 53  56    Creatinine 0.44 - 1.00 mg/dL 3.17  3.63    Sodium 135 - 145 mmol/L 133  134  132   Potassium 3.5 - 5.1 mmol/L 4.5  4.5  5.0   Chloride 98 - 111 mmol/L 105  104    CO2 22 - 32 mmol/L 16  17    Calcium 8.9 - 10.3 mg/dL 9.2  9.5    Total Protein 6.5 - 8.1 g/dL  7.5    Total Bilirubin 0.3 - 1.2 mg/dL  0.4  Alkaline Phos 38 - 126 U/L  96    AST 15 - 41 U/L  12    ALT 0 - 44 U/L  11      Assessment/Plan:   Principal Problem:   Acute on chronic kidney failure (HCC) Active Problems:   Type 2 diabetes mellitus with hyperglycemia, with long-term current use of insulin (HCC)   Diabetic gastroparesis (HCC)   Anxiety   Abdominal pain   Chronic pain   AKI (acute kidney injury) [facility])   Patient Summary: Deborah [name] is a 56 yo F with a PMH of T2DM c/b gastroparesis, HTN, HLD, paroxysmal atrial fibrillation on chronic anticoagulation with eliquis, CKD stage 3b, class 3 obesity, chronic pain, h/o spinal stenosis mostly wheel chair bound, HFpEF presenting with abdominal pain and admitted for AKI.  #Acute on Chronic Abdominal Pain Appears multifactorial including gastroparesis, constipation, and neuropathy. 4.5 mg IV dilaudid in past 24 hours -D/c home reglan as she's allergic -Senna nightly as needed -Pantoprazole 40 mg daily -Cymbalta 40 mg qd -IV dilaudid 0.5 mg q6 hour prn for breakthrough pain -Dilaudid p.o. 4 mg q8 hour scheduled -Simethicone q6 hour prn for flatulence -Advance diet as tolerated -Continue outpatient GI care in [place] outpatient pain clinic  #AKI on CKD3b Due to  dehydration in the setting of poor oral intake. Treated with IV fluids at admission, will stop IVF today and encourage oral hydration. Creatinine downtrending from 3.17>2.45.  Baseline appears to be around 2. -AM BMP  #HTN -Continue to monitor -Continue home hydralazine 50 mg 3 times daily -Continue home Imdur 60 mg daily -Continue home metoprolol XL 50 mg daily  #T2DM -Increase Semglee 25 to 38 u  -SSI & CBG with 6 unit meal coverage qac and qhs  #PAF -Continue home Eliquis  #BLE No erythema of BLE, mild swelling but very sensitive to palpation. Takes torsemide 40 mg qd at home.  -Continue to monitor -Holding torsemide in setting of AKI  #IDA #Anemia of Chronic Disease Tsat 16%. CKD likely contributory. Elevated ferritin likely secondary to  -Iron supplementation  Diet: Carb-Modified IVF: None,None VTE: Eliquis Code: Full PT/OT recs: None, none. TOC recs: none  Dispo: Anticipated discharge to Home in 1-2 days pending AKI resolution and pain management.   France Ravens, MD 08/17/2021, 7:08 AM Pager: [phone]  Please contact the on call pager after 5 pm and on weekends at [phone].

## 2021-08-17 NOTE — Progress Notes (Signed)
Inpatient Diabetes Program Recommendations  AACE/ADA: New Consensus Statement on Inpatient Glycemic Control   Target Ranges:  Prepandial:   less than 140 mg/dL      Peak postprandial:   less than 180 mg/dL (1-2 hours)      Critically ill patients:  140 - 180 mg/dL    Latest Reference Range & Units 08/16/21 09:08 08/16/21 12:15 08/16/21 16:49 08/16/21 19:49 08/17/21 00:14 08/17/21 08:01  Glucose-Capillary 70 - 99 mg/dL 235 (H)   301 (H)  Novolog 7 units 332 (H)  Novolog 7 units 381 (H) 344 (H)    Semglee 25 units 193 (H)  Novolog 4 units   Review of Glycemic Control  Diabetes history: DM2 Outpatient Diabetes medications: Lantus 50 units QHS, Novolog 30 units QID Current orders for Inpatient glycemic control: Semglee 35 units QHS, Novolog 0-20 units TID with meals  Inpatient Diabetes Program Recommendations:    Insulin: Noted Semglee increased from 25 units QHS to 35 units QHS today. Fasting glucose 193 mg/dl today. Please consider decreasing Semglee to 27 units QHS and ordering  Novolog 6 units TID with meals for meal coverage if patient eats at least 50% of meals.   Thanks, Barnie Alderman, RN, MSN, [place] Diabetes Coordinator Inpatient Diabetes Program [phone] (Team Pager from 8am to [place])

## 2021-08-17 NOTE — Progress Notes (Signed)
Mobility Specialist Progress Note:   08/17/21 1500  Mobility  Activity Transferred to/from [facility]  Level of Assistance Independent  Assistive Device None  Distance Ambulated (ft) 3 ft  Activity Response Tolerated well  $Mobility charge 1 Mobility   Pt received transferring to [facility] independently. Declined any mobility at this time. States she uses a wheelchair at baseline, however can walk. Pt encouraged to ambulate often, mother in room encouraging as well. Pt states "I don't need exercise".   Nelta Numbers Acute Rehab Secure Chat or Office Phone: [phone]

## 2021-08-18 ENCOUNTER — Inpatient Hospital Stay (HOSPITAL_COMMUNITY): Payer: Medicare Other

## 2021-08-18 DIAGNOSIS — N10 Acute tubulo-interstitial nephritis: Secondary | ICD-10-CM | POA: Diagnosis not present

## 2021-08-18 DIAGNOSIS — N1832 Chronic kidney disease, stage 3b: Secondary | ICD-10-CM | POA: Diagnosis not present

## 2021-08-18 DIAGNOSIS — N179 Acute kidney failure, unspecified: Secondary | ICD-10-CM | POA: Diagnosis not present

## 2021-08-18 LAB — COMPREHENSIVE METABOLIC PANEL
ALT: 10 U/L (ref 0–44)
AST: 11 U/L — ABNORMAL LOW (ref 15–41)
Albumin: 3 g/dL — ABNORMAL LOW (ref 3.5–5.0)
Alkaline Phosphatase: 85 U/L (ref 38–126)
Anion gap: 8 (ref 5–15)
BUN: 31 mg/dL — ABNORMAL HIGH (ref 6–20)
CO2: 21 mmol/L — ABNORMAL LOW (ref 22–32)
Calcium: 9.4 mg/dL (ref 8.9–10.3)
Chloride: 108 mmol/L (ref 98–111)
Creatinine, Ser: 2.17 mg/dL — ABNORMAL HIGH (ref 0.44–1.00)
GFR, Estimated: 26 mL/min — ABNORMAL LOW (ref >60)
Glucose, Bld: 238 mg/dL — ABNORMAL HIGH (ref 70–99)
Potassium: 3.9 mmol/L (ref 3.5–5.1)
Sodium: 137 mmol/L (ref 135–145)
Total Bilirubin: 0.5 mg/dL (ref 0.3–1.2)
Total Protein: 7.2 g/dL (ref 6.5–8.1)

## 2021-08-18 LAB — CBC
HCT: 24 % — ABNORMAL LOW (ref 36.0–46.0)
Hemoglobin: 7.8 g/dL — ABNORMAL LOW (ref 12.0–15.0)
MCH: 31 pg (ref 26.0–34.0)
MCHC: 32.5 g/dL (ref 30.0–36.0)
MCV: 95.2 fL (ref 80.0–100.0)
Platelets: 300 10*3/uL (ref 150–400)
RBC: 2.52 MIL/uL — ABNORMAL LOW (ref 3.87–5.11)
RDW: 13 % (ref 11.5–15.5)
WBC: 11 10*3/uL — ABNORMAL HIGH (ref 4.0–10.5)
nRBC: 0 % (ref 0.0–0.2)

## 2021-08-18 LAB — GLUCOSE, CAPILLARY
Glucose-Capillary: 296 mg/dL — ABNORMAL HIGH (ref 70–99)
Glucose-Capillary: 238 mg/dL — ABNORMAL HIGH (ref 70–99)
Glucose-Capillary: 255 mg/dL — ABNORMAL HIGH (ref 70–99)
Glucose-Capillary: 159 mg/dL — ABNORMAL HIGH (ref 70–99)

## 2021-08-18 LAB — TECHNOLOGIST SMEAR REVIEW

## 2021-08-18 LAB — URINE CULTURE: Culture: >=100000 — AB

## 2021-08-18 LAB — LIPASE, BLOOD: Lipase: 35 U/L (ref 11–51)

## 2021-08-18 IMAGING — CT CT ABD-PELV W/O CM
2 of 4 series · 17 of 46 positions shown, 19 images · non-contrast
Comparison: 06/10/2019

CLINICAL DATA: Nausea and vomiting for 2 hours

EXAM:
CT ABDOMEN AND PELVIS WITHOUT CONTRAST
TECHNIQUE: Multidetector CT imaging of the abdomen and pelvis was performed
following the standard protocol without IV contrast.

[Series 2: axial st · axial · 0.82mm/px · z∈[-497,-77]mm · 14 of 94 slices shown, 16 images]
[im 5/94  soft-tissue]
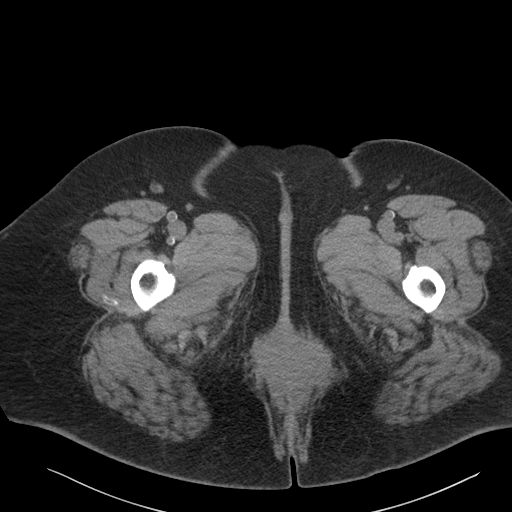
[im 5/94  bone]
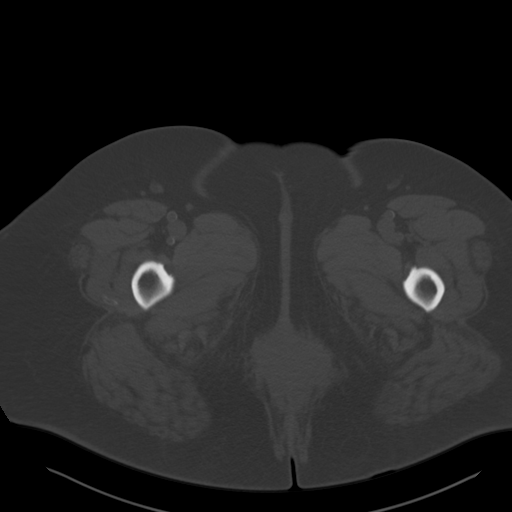
[im 10/94  soft-tissue]
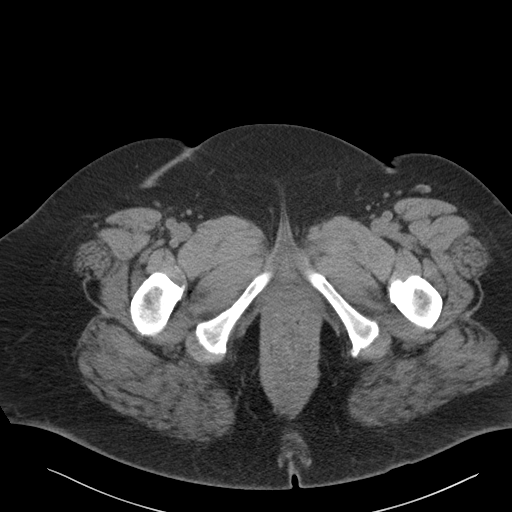
[im 20/94  soft-tissue]
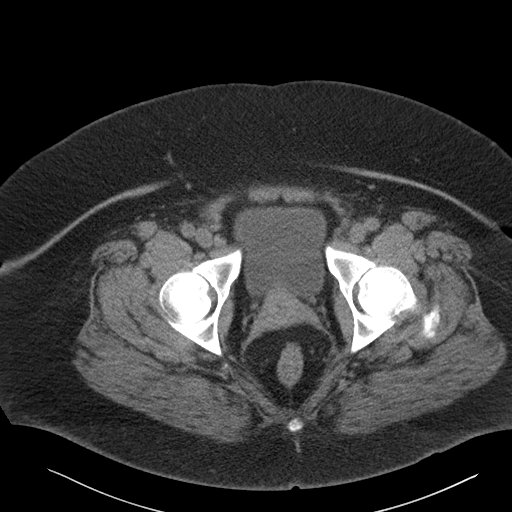
[im 25/94  soft-tissue]
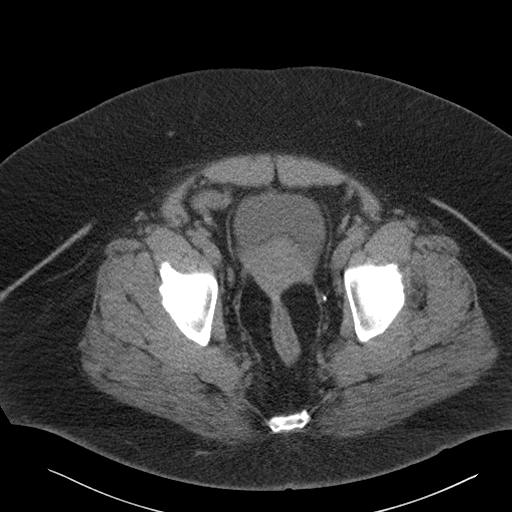
[im 30/94  soft-tissue]
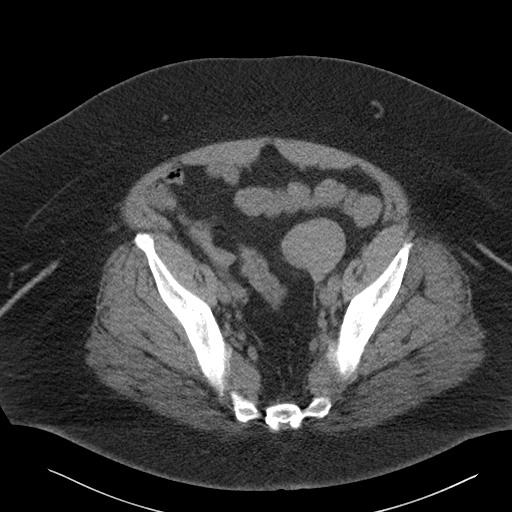
[im 40/94  soft-tissue]
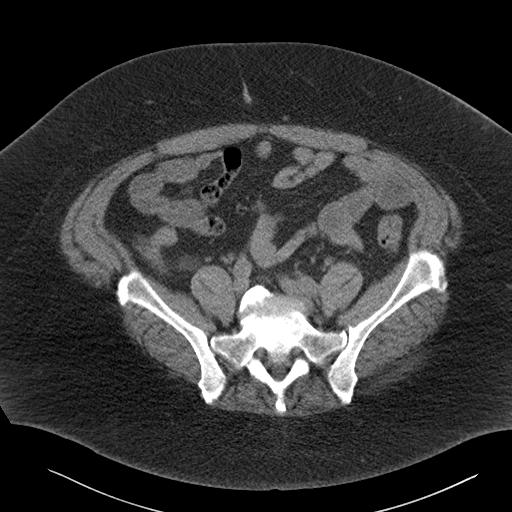
[im 45/94  soft-tissue]
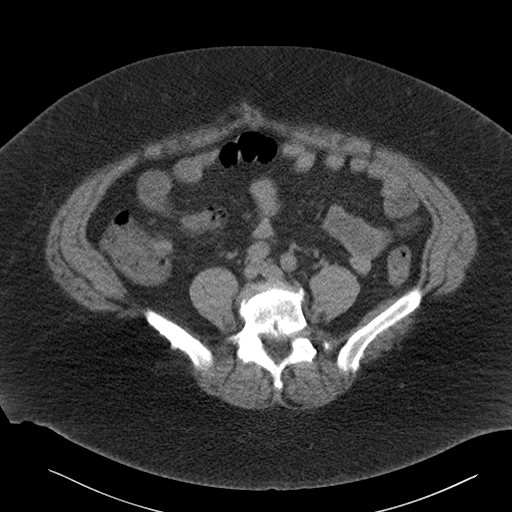
[im 49/94  soft-tissue]
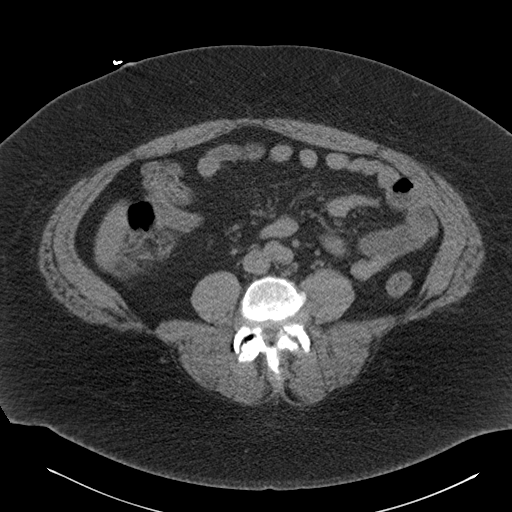
[im 54/94  soft-tissue]
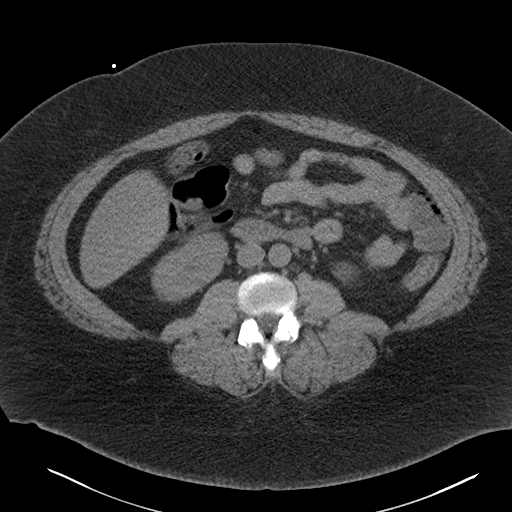
[im 54/94  bone]
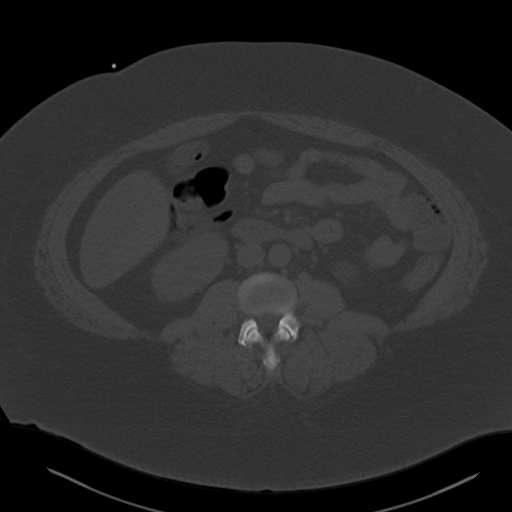
[im 64/94  soft-tissue]
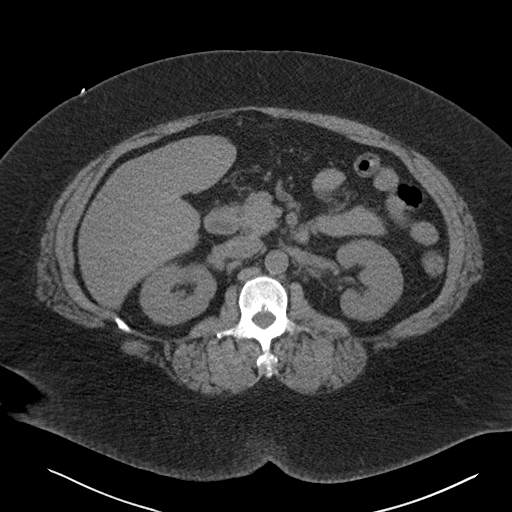
[im 69/94  soft-tissue]
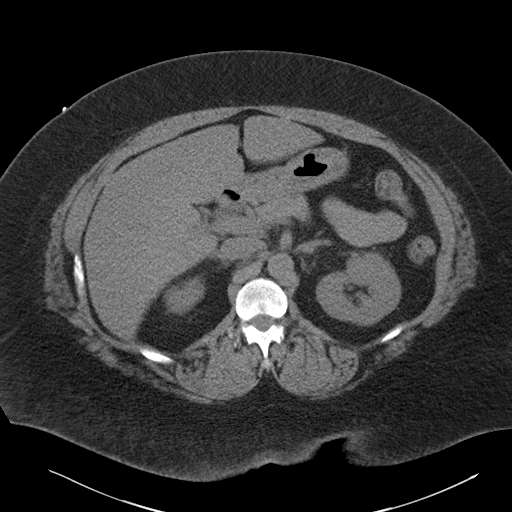
[im 74/94  soft-tissue]
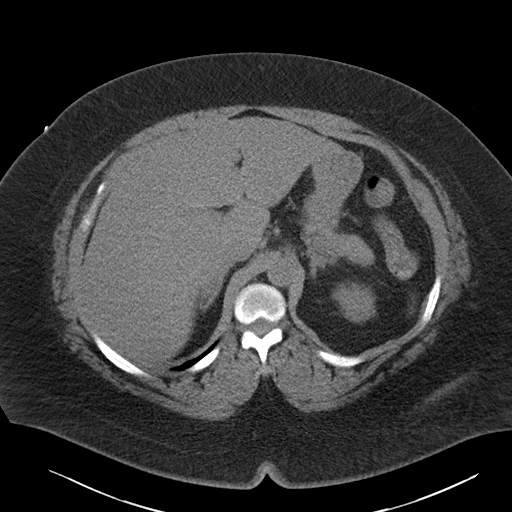
[im 84/94  soft-tissue]
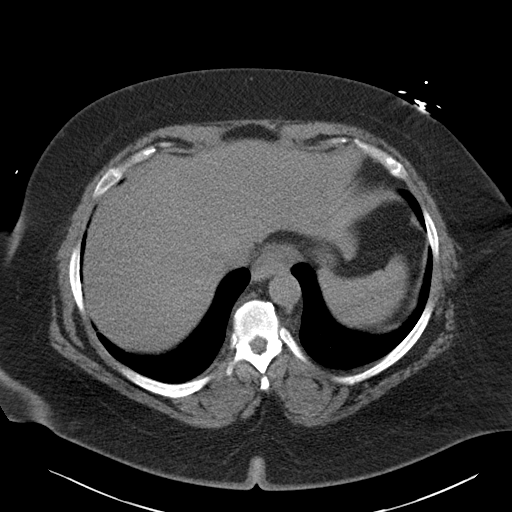
[im 89/94  soft-tissue]
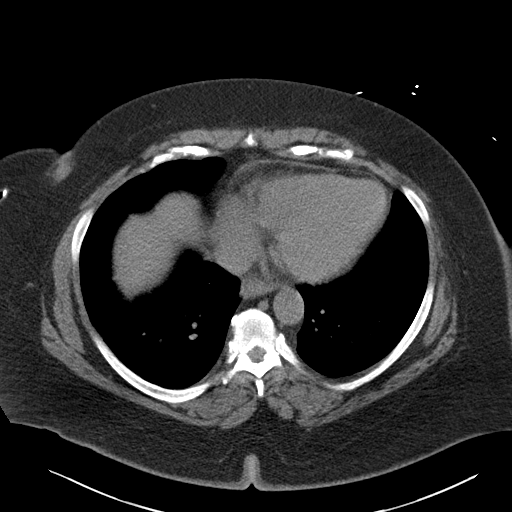

[Series 5: coronal st · coronal · 0.97mm/px · 3 of 179 slices shown]
[im 60/179  soft-tissue]
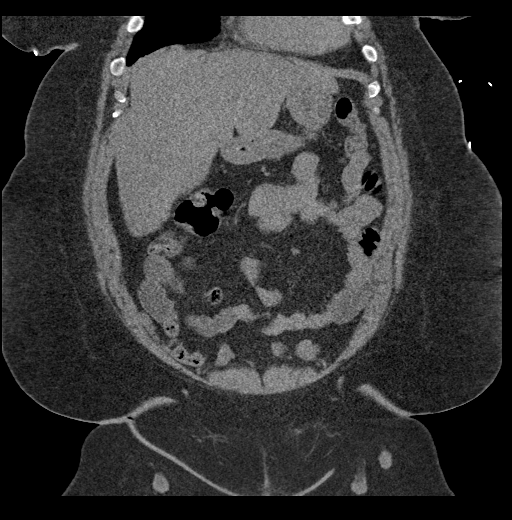
[im 80/179  soft-tissue]
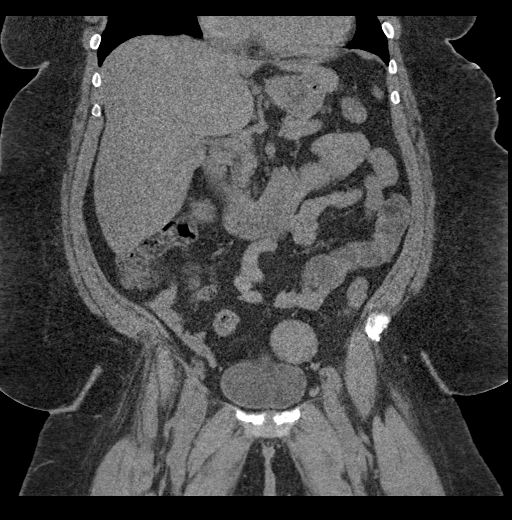
[im 99/179  soft-tissue]
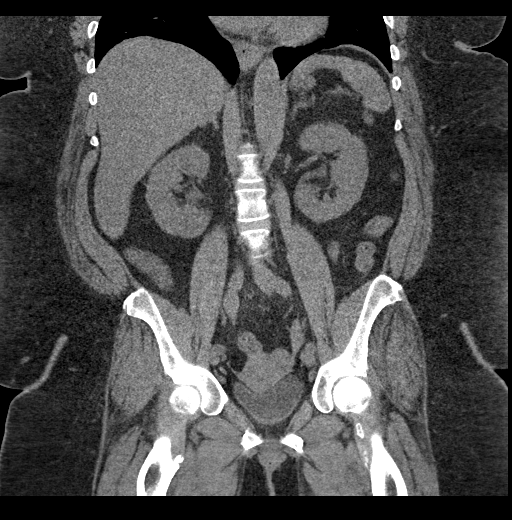

[17 of 46 positions shown; findings below may reference images not displayed]

FINDINGS: Lower chest:  No contributory findings.

Hepatobiliary: No focal liver abnormality.Cholecystectomy without
bile duct dilatation.

Pancreas: Unremarkable.

Spleen: Unremarkable.

Adrenals/Urinary Tract: Negative adrenals. No hydronephrosis or
stone. Unremarkable bladder.

Stomach/Bowel:  No obstruction. No appendicitis.

Vascular/Lymphatic: No acute vascular abnormality. No mass or
adenopathy.

Reproductive:5 cm left adnexal mass is an exophytic fibroid by 9354
pelvic MRI. Pelvic floor laxity.

Other: No ascites or pneumoperitoneum.

Musculoskeletal: No acute abnormalities. Spondylosis and advanced
lower lumbar facet osteoarthritis. Biforaminal impingement at L4-5
where there is jild anterolisthesis.
IMPRESSION: 1. No acute finding including bowel obstruction or visible
inflammation.
2. Chronic findings are stable from CT 1 month ago and described
above.

## 2021-08-18 MED ORDER — CEFAZOLIN SODIUM-DEXTROSE 1-4 GM/50ML-% IV SOLN
1.0000 g | Freq: Three times a day (TID) | INTRAVENOUS | Status: DC
  Administered 2021-08-18 – 2021-08-20 (×7): 1 g via INTRAVENOUS
  Filled 2021-08-18 (×8): qty 50

## 2021-08-18 MED ORDER — HYDROMORPHONE HCL 1 MG/ML IJ SOLN
1.0000 mg | Freq: Once | INTRAMUSCULAR | Status: AC
  Administered 2021-08-18: 1 mg via INTRAVENOUS
  Filled 2021-08-18: qty 1

## 2021-08-18 MED ORDER — HYDROMORPHONE HCL 1 MG/ML IJ SOLN
1.0000 mg | INTRAMUSCULAR | Status: DC | PRN
  Administered 2021-08-18 – 2021-08-21 (×15): 1 mg via INTRAVENOUS
  Filled 2021-08-18 (×15): qty 1

## 2021-08-18 MED ORDER — CEFAZOLIN SODIUM-DEXTROSE 1-4 GM/50ML-% IV SOLN
1.0000 g | Freq: Three times a day (TID) | INTRAVENOUS | Status: DC
  Filled 2021-08-18: qty 50

## 2021-08-18 MED ORDER — LACTATED RINGERS IV SOLN: INTRAVENOUS | Status: AC

## 2021-08-18 MED ORDER — INSULIN GLARGINE-YFGN 100 UNIT/ML ~~LOC~~ SOLN
32.0000 [IU] | Freq: Every day | SUBCUTANEOUS | Status: DC
  Administered 2021-08-18 – 2021-08-22 (×5): 32 [IU] via SUBCUTANEOUS
  Filled 2021-08-18 (×6): qty 0.32

## 2021-08-18 MED ORDER — INSULIN ASPART 100 UNIT/ML IJ SOLN
10.0000 [IU] | Freq: Three times a day (TID) | INTRAMUSCULAR | Status: DC
  Administered 2021-08-18 – 2021-08-20 (×6): 10 [IU] via SUBCUTANEOUS

## 2021-08-18 NOTE — Progress Notes (Signed)
HD#2 Subjective:  Overnight Events: Refer to Dr. Flora Lipps comprehensive note regarding overnight pain and management.   Patient seen and assessed at bedside. Tearful reporting that she is in excruciating pain. Discussed that we started patient on antibiotics for suspected pyelonephritis and that we would continue supportive management.   Objective:  Vital signs in last 24 hours: Vitals:   08/17/21 0800 08/17/21 1329 08/17/21 1630 08/18/21 0359  BP: (!) 166/84 134/78 (!) 141/82 (!) 170/102  Pulse: (!) 107  96 (!) 110  Resp: '17  17 18  '$ Temp: 98.1 F (36.7 C)  98.2 F (36.8 C) 98.6 F (37 C)  TempSrc: Oral  Oral Oral  SpO2: 100%  100%   Weight:      Height:       Supplemental O2: Room Air SpO2: 100 %   Physical Exam:  Physical Exam Constitutional:      Appearance: She is obese. She is ill-appearing. She is not diaphoretic.  HENT:     Head: Normocephalic and atraumatic.  Cardiovascular:     Rate and Rhythm: Normal rate and regular rhythm.     Pulses: Normal pulses.     Heart sounds: Normal heart sounds.  Pulmonary:     Effort: Pulmonary effort is normal.     Breath sounds: Normal breath sounds.  Abdominal:     General: Abdomen is flat.     Palpations: Abdomen is soft.     Tenderness: There is abdominal tenderness.     Comments: Generalized abdominal tenderness  Skin:    General: Skin is warm and dry.     Capillary Refill: Capillary refill takes less than 2 seconds.  Neurological:     General: No focal deficit present.     Mental Status: She is oriented to person, place, and time. Mental status is at baseline.  Psychiatric:     Comments: anxious     Filed Weights   08/15/21 1852  Weight: 113.4 kg     Intake/Output Summary (Last 24 hours) at 08/18/2021 0718 Last data filed at 08/17/2021 1500 Gross per 24 hour  Intake 800 ml  Output --  Net 800 ml    Net IO Since Admission: 2,800 mL [08/18/21 0718]  Pertinent Labs:    Latest Ref Rng & Units  08/18/2021   12:29 AM 08/17/2021    7:02 AM 08/16/2021    5:20 AM  CBC  WBC 4.0 - 10.5 K/uL 11.0  10.2  11.1   Hemoglobin 12.0 - 15.0 g/dL 7.8  7.4  8.2   Hematocrit 36.0 - 46.0 % 24.0  23.3  25.0   Platelets 150 - 400 K/uL 300  293  305        Latest Ref Rng & Units 08/18/2021   12:29 AM 08/17/2021    7:02 AM 08/16/2021   12:17 PM  CMP  Glucose 70 - 99 mg/dL 238  215  326   BUN 6 - 20 mg/dL 31  42  53   Creatinine 0.44 - 1.00 mg/dL 2.17  2.45  3.17   Sodium 135 - 145 mmol/L 137  136  133   Potassium 3.5 - 5.1 mmol/L 3.9  4.0  4.5   Chloride 98 - 111 mmol/L 108  107  105   CO2 22 - 32 mmol/L '21  19  16   '$ Calcium 8.9 - 10.3 mg/dL 9.4  9.3  9.2   Total Protein 6.5 - 8.1 g/dL 7.2     Total Bilirubin  0.3 - 1.2 mg/dL 0.5     Alkaline Phos 38 - 126 U/L 85     AST 15 - 41 U/L 11     ALT 0 - 44 U/L 10       Assessment/Plan:   Principal Problem:   Acute on chronic kidney failure (HCC) Active Problems:   Type 2 diabetes mellitus with hyperglycemia, with long-term current use of insulin (HCC)   Diabetic gastroparesis (HCC)   Anxiety   Abdominal pain   Chronic pain   AKI (acute kidney injury) [facility])   Patient Summary: [name] is a 56 yo F with a PMH of T2DM c/b gastroparesis, HTN, HLD, paroxysmal atrial fibrillation on chronic anticoagulation with eliquis, CKD stage 3b, class 3 obesity, chronic pain, h/o spinal stenosis mostly wheel chair bound, HFpEF presenting with abdominal pain and admitted for AKI.  #Acute Pyelonephritis #Acute on Chronic Abdominal Pain Appears multifactorial including gastroparesis, constipation, and neuropathy. New pain may be due to pyelonephritis given urine culture and CT abdomen/pelvis findings although she also does not have fever or have leucocytosis -Ancef 1 g q8 hour -Senna nightly as needed -Pantoprazole 40 mg daily -Cymbalta 40 mg qd -IV dilaudid 1 mg q4 hour prn for breakthrough pain -Dilaudid p.o. 4 mg q8 hour  scheduled -Simethicone q6 hour prn for flatulence -Advance diet as tolerated -Continue outpatient GI care in [place] outpatient pain clinic  #AKI on CKD3b, improving Due to dehydration in the setting of poor oral intake. Treated with IV fluids at admission, will stop IVF today and encourage oral hydration. Creatinine downtrending from 2.45>2.17.  Baseline appears to be around 2. -AM BMP  #HTN Elevated but likely secondary to pain -Continue to monitor -Continue home hydralazine 50 mg 3 times daily -Continue home Imdur 60 mg daily -Continue home metoprolol XL 50 mg daily  #T2DM -Increase Semglee from 28 to 32 u -SSI & CBG with 6 unit meal coverage qac and qhs  #PAF -Continue home Eliquis  #BLE No erythema of BLE, mild swelling but very sensitive to palpation. Takes torsemide 40 mg qd at home.  -Continue to monitor -Holding torsemide in setting of AKI  #IDA #Anemia of Chronic Disease Concern for sickle cell disease. Tsat 16%. CKD likely contributory. Elevated ferritin likely secondary to inflammatory response -Iron supplementation -Pathology and Technologist smear  Diet: Carb-Modified IVF: None,None VTE: Eliquis Code: Full PT/OT recs: None, none. TOC recs: none  Dispo: Anticipated discharge to Home in 1-2 days pending AKI resolution and pain management.   France Ravens, MD 08/18/2021, 7:18 AM Pager: [phone]  Please contact the on call pager after 5 pm and on weekends at [phone].

## 2021-08-18 NOTE — Inpatient Diabetes Management (Signed)
Inpatient Diabetes Program Recommendations  AACE/ADA: New Consensus Statement on Inpatient Glycemic Control   Target Ranges:  Prepandial:   less than 140 mg/dL      Peak postprandial:   less than 180 mg/dL (1-2 hours)      Critically ill patients:  140 - 180 mg/dL     Latest Reference Range & Units 08/17/21 08:01 08/17/21 11:26 08/17/21 16:31 08/17/21 22:43 08/18/21 08:42  Glucose-Capillary 70 - 99 mg/dL 193 (H) 302 (H) 236 (H) 226 (H) 296 (H)   Review of Glycemic Control  Diabetes history: DM2 Outpatient Diabetes medications: Lantus 50 units QHS, Novolog 30 units QID Current orders for Inpatient glycemic control: Semglee 28 units QHS, Novolog 0-15 units TID with meals, Novolog 6 units TID with meals  Inpatient Diabetes Program Recommendations:    Insulin: Please consider increasing Semglee to 30  units QHS, increasing meal coverage to Novolog 10 units TID with meals, and adding Novolog 0-5 units QHS.  Thanks, Barnie Alderman, RN, MSN, [place] Diabetes Coordinator Inpatient Diabetes Program [phone] (Team Pager from 8am to [place])

## 2021-08-18 NOTE — TOC Initial Note (Signed)
Transition of Care [facility]) - Initial/Assessment Note    Patient Details  Name: Deborah [name] MRN: [id number] Date of Birth: [birth year]  Transition of Care [facility]) CM/SW Contact:    Marilu Favre, RN Phone Number: 08/18/2021, 12:43 PM  Clinical Narrative:                 Went to talk to patient and do TOC assessment . Patient stated she is hurting and cannot talk right now.   TOC will follow up later         Patient Goals and CMS Choice        Expected Discharge Plan and Services                                                Prior Living Arrangements/Services                       Activities of Daily Living      Permission Sought/Granted                  Emotional Assessment              Admission diagnosis:  Acute on chronic kidney failure ([place]) [N17.9, N18.9] AKI (acute kidney injury) ([place]) [N17.9] Urinary tract infection without hematuria, site unspecified [N39.0] Other specified diabetes mellitus with hyperglycemia, with long-term current use of insulin ([place]) [E13.65, Z79.4] Patient Active Problem List   Diagnosis Date Noted   AKI (acute kidney injury) ([place]) 08/16/2021   Acute on chronic kidney failure ([place]) 08/15/2021   PAF (paroxysmal atrial fibrillation) (HCC)    Prolonged QT interval    HFrEF (heart failure with reduced ejection fraction) ([place]) 02/21/2020   Chronic pain 09/04/2019   Malignant carcinoid tumor of duodenum ([place])    Nonalcoholic fatty liver disease 06/05/2019   Restrictive lung disease secondary to obesity    History of gastric ulcer    Fibroid uterus 84/13/2440   Chronic systolic CHF (congestive heart failure) ([place]) 12/19/2018   Intractable nausea and vomiting 12/17/2018   Abdominal pain 07/17/2018   Anxiety 11/29/2017   Spinal stenosis, lumbar region without neurogenic claudication 08/03/2017   GERD (gastroesophageal reflux disease) 03/19/2017   Depression 03/19/2017   Morbid obesity ([place])     Normocytic anemia 08/16/2016   Diabetic gastroparesis ([place]) 08/16/2016   Gout 06/05/2016   Type 2 diabetes mellitus with hyperglycemia, with long-term current use of insulin ([place]) 05/25/2015   Chronic kidney disease, stage 3b ([place]) 05/25/2015   Essential hypertension, benign 09/28/2013   PCP:  Cena Benton, MD Pharmacy:   [place], [place] - [address] [address] Phone: [phone] Fax: [phone]     Social Determinants of Health (SDOH) Interventions    Readmission Risk Interventions    07/02/2019   11:00 AM 06/25/2019    3:14 PM 01/29/2019   10:18 AM  Readmission Risk Prevention Plan  Transportation Screening Complete Complete Complete  PCP or Specialist Appt within 3-5 Days   Complete  HRI or Home Care Consult   Complete  Social Work Consult for [place] Planning/Counseling   Complete  Palliative Care Screening   Not Applicable  Medication Review (RN Care Manager) Complete Complete Complete  PCP or Specialist appointment within 3-5 days of discharge  Complete  [place] or Home Care Consult Complete    SW Recovery Care/Counseling Consult Complete Complete   Palliative Care Screening Not Applicable Not Applicable   Skilled Nursing Facility Not Applicable Not Applicable

## 2021-08-18 NOTE — Progress Notes (Deleted)
Received patient  sitting and leaning over bedside table crying. I asked her why is she crying? She stated " My stomach hurt so bad ,I cant take it anymore." I gave her PRN Dilaudid. At this time she gets back in bed and gets into a fetal position.Will continue to monitor.

## 2021-08-18 NOTE — Progress Notes (Addendum)
Received a page from nurse that patient was crying out non-stop about pain loudly enough to wake neighboring patient rooms. Went to bedside to assess. Upon entering the room patient lying still on her side moaning softly in bed. Patient complaining of a sharp, new, all over abdominal pain. Says that it is different than her pain in the past. She did have a tray of food at bedside with only a couple of bites taken out. She did endorse trying to eat earlier in the evening but was not able to eat much. After talking with the patient she asked for some more pain medications. On exam her belly was soft with voluntary guarding. Vital signs were unremarkable. IMTS told patient since the pain was new that we would get some further imaging of her belly and that we would see what we could do for her pain. Patient then began to scream loudly and writhe around in bed praying for the pain to be taken away. Given severity of presentation will get CTAP non contrast, CBC, CMP,and lipase to assess for acute change. If this come back benign the pain may be related to gastroparesis and attempts to advance diet.  Also gave a 1 time dose of dilaudid 1 mg IV for breakthrough after which patient calmed back down. Will continue to monitor.   Addendum: CT showed bilateral stranding around the kidneys which could represent fluid shifts vs bilateral pyelonephritis. Went back to assess for back pain. Started assessment asking about pain in non-involved sites such as elbow and forearm. Patient would cry out in response to palpation of these areas the same as she would when assessing for CVA tenderness. Difficult to assess pain due to pan-positive endorsement of pain, though it is reassuring that she does not have more reaction/a stronger endorsement of pain when assessing for CVA tenderness. Low suspicion for bilateral pyelo at this time, suspect fluid shifts more likely.

## 2021-08-19 DIAGNOSIS — N10 Acute tubulo-interstitial nephritis: Secondary | ICD-10-CM | POA: Diagnosis not present

## 2021-08-19 LAB — GLUCOSE, CAPILLARY
Glucose-Capillary: 180 mg/dL — ABNORMAL HIGH (ref 70–99)
Glucose-Capillary: 159 mg/dL — ABNORMAL HIGH (ref 70–99)
Glucose-Capillary: 170 mg/dL — ABNORMAL HIGH (ref 70–99)
Glucose-Capillary: 249 mg/dL — ABNORMAL HIGH (ref 70–99)

## 2021-08-19 LAB — BASIC METABOLIC PANEL
Anion gap: 10 (ref 5–15)
BUN: 20 mg/dL (ref 6–20)
CO2: 24 mmol/L (ref 22–32)
Calcium: 9.1 mg/dL (ref 8.9–10.3)
Chloride: 104 mmol/L (ref 98–111)
Creatinine, Ser: 1.72 mg/dL — ABNORMAL HIGH (ref 0.44–1.00)
GFR, Estimated: 34 mL/min — ABNORMAL LOW (ref >60)
Glucose, Bld: 164 mg/dL — ABNORMAL HIGH (ref 70–99)
Potassium: 3.7 mmol/L (ref 3.5–5.1)
Sodium: 138 mmol/L (ref 135–145)

## 2021-08-19 LAB — PATHOLOGIST SMEAR REVIEW

## 2021-08-19 LAB — CBC
HCT: 25.8 % — ABNORMAL LOW (ref 36.0–46.0)
Hemoglobin: 8.5 g/dL — ABNORMAL LOW (ref 12.0–15.0)
MCH: 30.9 pg (ref 26.0–34.0)
MCHC: 32.9 g/dL (ref 30.0–36.0)
MCV: 93.8 fL (ref 80.0–100.0)
Platelets: 332 10*3/uL (ref 150–400)
RBC: 2.75 MIL/uL — ABNORMAL LOW (ref 3.87–5.11)
RDW: 12.9 % (ref 11.5–15.5)
WBC: 13.2 10*3/uL — ABNORMAL HIGH (ref 4.0–10.5)
nRBC: 0 % (ref 0.0–0.2)

## 2021-08-19 NOTE — Progress Notes (Signed)
HD#3 Subjective:  Overnight Events: NAEON. Used 4 doses of 1 mg dilaudid injection along with scheduled 4 mg PO dilaudid tid.   Patient seen and assessed at bedside.  Patient is upright with head and table.  Patient endorses continued abdominal pain as well as CVA tenderness.  Patient does feel that the pain has been better controlled with antibiotics and current pain medications.  Objective:  Vital signs in last 24 hours: Vitals:   08/18/21 0843 08/18/21 1547 08/18/21 1948 08/19/21 0500  BP: (!) 172/97 (!) 158/96 (!) 159/105 (!) 154/96  Pulse: (!) 109 (!) 103 (!) 107 (!) 102  Resp: '18 17 18 18  '$ Temp: 97.8 F (36.6 C) 97.9 F (36.6 C) 97.7 F (36.5 C) 98.7 F (37.1 C)  TempSrc: Oral Oral Oral Oral  SpO2: 100% 100% 100% 100%  Weight:      Height:       Supplemental O2: Room Air SpO2: 100 %   Physical Exam:  Physical Exam Constitutional:      Appearance: She is obese. She is ill-appearing. She is not diaphoretic.  HENT:     Head: Normocephalic and atraumatic.  Cardiovascular:     Rate and Rhythm: Normal rate and regular rhythm.     Pulses: Normal pulses.     Heart sounds: Normal heart sounds.  Pulmonary:     Effort: Pulmonary effort is normal.     Breath sounds: Normal breath sounds.  Abdominal:     General: Abdomen is flat.     Palpations: Abdomen is soft.     Tenderness: There is abdominal tenderness.     Comments: Generalized abdominal tenderness  Skin:    General: Skin is warm and dry.     Capillary Refill: Capillary refill takes less than 2 seconds.  Neurological:     General: No focal deficit present.     Mental Status: She is oriented to person, place, and time. Mental status is at baseline.  Psychiatric:     Comments: anxious     Filed Weights   08/15/21 1852  Weight: 113.4 kg     Intake/Output Summary (Last 24 hours) at 08/19/2021 0637 Last data filed at 08/19/2021 0350 Gross per 24 hour  Intake 954.51 ml  Output --  Net 954.51 ml     Net IO Since Admission: 3,754.51 mL [08/19/21 0637]  Pertinent Labs:    Latest Ref Rng & Units 08/19/2021    6:04 AM 08/18/2021   12:29 AM 08/17/2021    7:02 AM  CBC  WBC 4.0 - 10.5 K/uL 13.2  11.0  10.2   Hemoglobin 12.0 - 15.0 g/dL 8.5  7.8  7.4   Hematocrit 36.0 - 46.0 % 25.8  24.0  23.3   Platelets 150 - 400 K/uL 332  300  293        Latest Ref Rng & Units 08/18/2021   12:29 AM 08/17/2021    7:02 AM 08/16/2021   12:17 PM  CMP  Glucose 70 - 99 mg/dL 238  215  326   BUN 6 - 20 mg/dL 31  42  53   Creatinine 0.44 - 1.00 mg/dL 2.17  2.45  3.17   Sodium 135 - 145 mmol/L 137  136  133   Potassium 3.5 - 5.1 mmol/L 3.9  4.0  4.5   Chloride 98 - 111 mmol/L 108  107  105   CO2 22 - 32 mmol/L '21  19  16   '$ Calcium 8.9 -  10.3 mg/dL 9.4  9.3  9.2   Total Protein 6.5 - 8.1 g/dL 7.2     Total Bilirubin 0.3 - 1.2 mg/dL 0.5     Alkaline Phos 38 - 126 U/L 85     AST 15 - 41 U/L 11     ALT 0 - 44 U/L 10       Assessment/Plan:   Principal Problem:   Acute pyelonephritis Active Problems:   Type 2 diabetes mellitus with hyperglycemia, with long-term current use of insulin (HCC)   Diabetic gastroparesis (HCC)   Anxiety   Abdominal pain   Chronic pain   Acute on chronic kidney failure ([place])   AKI (acute kidney injury) [facility])   Patient Summary: Ms. Tashawn Greff is a 56 yo F with a PMH of T2DM c/b gastroparesis, HTN, HLD, paroxysmal atrial fibrillation on chronic anticoagulation with eliquis, CKD stage 3b, class 3 obesity, chronic pain, h/o spinal stenosis mostly wheel chair bound, HFpEF presenting with abdominal pain and admitted for AKI.  #Acute Pyelonephritis #Acute on Chronic Abdominal Pain WBC 11.0>13.2. Afebrile. Abdominal pain likely multifactorial including gastroparesis, constipation, neuropathy, and now pyelonephritis. Will monitor closely in case of possible ischemic colitis underlying chronic abdominal pain. -Ancef 1 g q8 hour -Senna nightly as needed -Pantoprazole  40 mg daily -Cymbalta 40 mg qd -IV dilaudid 1 mg q4 hour prn for breakthrough pain -Dilaudid p.o. 4 mg q8 hour scheduled -Simethicone q6 hour prn for flatulence -Advance diet as tolerated -Continue outpatient GI care in [place] outpatient pain clinic  #AKI on CKD3b, improving Due to dehydration in the setting of poor oral intake. Treated with IV fluids at admission, will stop IVF today and encourage oral hydration. Creatinine downtrending from 2.17>1.72.  Baseline appears to be around 2. -AM BMP  #HTN Elevated but likely secondary to pain -Continue to monitor -Continue home hydralazine 50 mg 3 times daily -Continue home Imdur 60 mg daily -Continue home metoprolol XL 50 mg daily  #T2DM -Continue Semglee 32 u -SSI & CBG with 10 unit meal coverage qac and qhs  #PAF -Continue home Eliquis  #BLE No erythema of BLE, mild swelling but very sensitive to palpation. Takes torsemide 40 mg qd at home.  -Continue to monitor -Holding torsemide in setting of AKI  #IDA #Anemia of Chronic Disease Concern for sickle cell disease. Tsat 16%. CKD likely contributory. Elevated ferritin likely secondary to inflammatory response. Tech smear unremarkable -Iron supplementation -Pathology smear  Diet: Carb-Modified IVF: None,None VTE: Eliquis Code: Full PT/OT recs: None, none. TOC recs: none  Dispo: Anticipated discharge to Home in 1-2 days pending pain control and pyelonephritis   France Ravens, MD 08/19/2021, 6:37 AM Pager: [phone]  Please contact the on call pager after 5 pm and on weekends at [phone].

## 2021-08-19 NOTE — Care Management Important Message (Signed)
Important Message  Patient Details  Name: Deborah [name] MRN: [id number] Date of Birth: [birth year]   Medicare Important Message Given:  Yes     Deborah [name] [address] 08/19/2021, 3:45 PM

## 2021-08-20 DIAGNOSIS — N10 Acute tubulo-interstitial nephritis: Secondary | ICD-10-CM | POA: Diagnosis not present

## 2021-08-20 LAB — BASIC METABOLIC PANEL
Anion gap: 13 (ref 5–15)
BUN: 21 mg/dL — ABNORMAL HIGH (ref 6–20)
CO2: 23 mmol/L (ref 22–32)
Calcium: 8.6 mg/dL — ABNORMAL LOW (ref 8.9–10.3)
Chloride: 98 mmol/L (ref 98–111)
Creatinine, Ser: 3.08 mg/dL — ABNORMAL HIGH (ref 0.44–1.00)
GFR, Estimated: 17 mL/min — ABNORMAL LOW (ref >60)
Glucose, Bld: 203 mg/dL — ABNORMAL HIGH (ref 70–99)
Potassium: 3.2 mmol/L — ABNORMAL LOW (ref 3.5–5.1)
Sodium: 134 mmol/L — ABNORMAL LOW (ref 135–145)

## 2021-08-20 LAB — GLUCOSE, CAPILLARY
Glucose-Capillary: 125 mg/dL — ABNORMAL HIGH (ref 70–99)
Glucose-Capillary: 215 mg/dL — ABNORMAL HIGH (ref 70–99)
Glucose-Capillary: 183 mg/dL — ABNORMAL HIGH (ref 70–99)
Glucose-Capillary: 145 mg/dL — ABNORMAL HIGH (ref 70–99)

## 2021-08-20 LAB — CBC
HCT: 26.3 % — ABNORMAL LOW (ref 36.0–46.0)
Hemoglobin: 8.5 g/dL — ABNORMAL LOW (ref 12.0–15.0)
MCH: 30.9 pg (ref 26.0–34.0)
MCHC: 32.3 g/dL (ref 30.0–36.0)
MCV: 95.6 fL (ref 80.0–100.0)
Platelets: 364 10*3/uL (ref 150–400)
RBC: 2.75 MIL/uL — ABNORMAL LOW (ref 3.87–5.11)
RDW: 13.2 % (ref 11.5–15.5)
WBC: 14.4 10*3/uL — ABNORMAL HIGH (ref 4.0–10.5)
nRBC: 0 % (ref 0.0–0.2)

## 2021-08-20 LAB — MAGNESIUM: Magnesium: 1 mg/dL — ABNORMAL LOW (ref 1.7–2.4)

## 2021-08-20 MED ORDER — LACTATED RINGERS IV SOLN: INTRAVENOUS | Status: DC

## 2021-08-20 MED ORDER — POTASSIUM CHLORIDE 20 MEQ PO PACK
40.0000 meq | PACK | Freq: Once | ORAL | Status: DC
  Filled 2021-08-20: qty 2

## 2021-08-20 MED ORDER — CEFAZOLIN SODIUM-DEXTROSE 1-4 GM/50ML-% IV SOLN
1.0000 g | Freq: Two times a day (BID) | INTRAVENOUS | Status: DC
  Administered 2021-08-20 – 2021-08-21 (×2): 1 g via INTRAVENOUS
  Filled 2021-08-20 (×3): qty 50

## 2021-08-20 MED ORDER — POTASSIUM CHLORIDE CRYS ER 20 MEQ PO TBCR
40.0000 meq | EXTENDED_RELEASE_TABLET | ORAL | Status: AC
  Administered 2021-08-20 (×2): 40 meq via ORAL
  Filled 2021-08-20 (×2): qty 2

## 2021-08-20 MED ORDER — MAGNESIUM SULFATE 4 GM/100ML IV SOLN
4.0000 g | Freq: Once | INTRAVENOUS | Status: AC
  Administered 2021-08-20: 4 g via INTRAVENOUS
  Filled 2021-08-20: qty 100

## 2021-08-20 MED ORDER — INSULIN ASPART 100 UNIT/ML IJ SOLN
13.0000 [IU] | Freq: Three times a day (TID) | INTRAMUSCULAR | Status: DC
  Administered 2021-08-20 – 2021-08-22 (×5): 13 [IU] via SUBCUTANEOUS

## 2021-08-20 NOTE — Progress Notes (Addendum)
HD#4 Subjective:  Overnight Events: NAEON. Requested dilaudid 1 mg every 4 hour on top of PO dilaudid.   Patient seen and assessed at bedside. Reports abdominal pain is improving quite a bit. Still endorsing the new abdominal pain that radiates to her back but says that it improving. Does endorse some urinary frequency but denies dysuria, constipation, or diarrhea.  Objective:  Vital signs in last 24 hours: Vitals:   08/19/21 0739 08/19/21 1557 08/19/21 2215 08/20/21 0413  BP: (!) 130/99 101/73 108/71 129/83  Pulse: (!) 110 (!) 102 95 91  Resp: '16 16 16 17  '$ Temp: 98.2 F (36.8 C) 97.6 F (36.4 C) 97.8 F (36.6 C) 98.3 F (36.8 C)  TempSrc: Oral Oral Oral   SpO2: 100% 100% 100% 100%  Weight:      Height:       Supplemental O2: Room Air SpO2: 100 %   Physical Exam:  Physical Exam Constitutional:      Appearance: She is obese. She is ill-appearing. She is not diaphoretic.  HENT:     Head: Normocephalic and atraumatic.  Cardiovascular:     Rate and Rhythm: Normal rate and regular rhythm.     Pulses: Normal pulses.     Heart sounds: Normal heart sounds.  Pulmonary:     Effort: Pulmonary effort is normal.     Breath sounds: Normal breath sounds.  Abdominal:     General: Abdomen is flat.     Palpations: Abdomen is soft.     Tenderness: There is abdominal tenderness.     Comments: Generalized abdominal tenderness  Skin:    General: Skin is warm and dry.     Capillary Refill: Capillary refill takes less than 2 seconds.  Neurological:     General: No focal deficit present.     Mental Status: She is oriented to person, place, and time. Mental status is at baseline.  Psychiatric:     Comments: anxious     Filed Weights   08/15/21 1852  Weight: 113.4 kg     Intake/Output Summary (Last 24 hours) at 08/20/2021 0716 Last data filed at 08/20/2021 0230 Gross per 24 hour  Intake 390 ml  Output --  Net 390 ml    Net IO Since Admission: 4,144.51 mL [08/20/21  0716]  Pertinent Labs:    Latest Ref Rng & Units 08/20/2021   12:11 AM 08/19/2021    6:04 AM 08/18/2021   12:29 AM  CBC  WBC 4.0 - 10.5 K/uL 14.4  13.2  11.0   Hemoglobin 12.0 - 15.0 g/dL 8.5  8.5  7.8   Hematocrit 36.0 - 46.0 % 26.3  25.8  24.0   Platelets 150 - 400 K/uL 364  332  300        Latest Ref Rng & Units 08/20/2021   12:11 AM 08/19/2021    6:04 AM 08/18/2021   12:29 AM  CMP  Glucose 70 - 99 mg/dL 203  164  238   BUN 6 - 20 mg/dL '21  20  31   '$ Creatinine 0.44 - 1.00 mg/dL 3.08  1.72  2.17   Sodium 135 - 145 mmol/L 134  138  137   Potassium 3.5 - 5.1 mmol/L 3.2  3.7  3.9   Chloride 98 - 111 mmol/L 98  104  108   CO2 22 - 32 mmol/L '23  24  21   '$ Calcium 8.9 - 10.3 mg/dL 8.6  9.1  9.4   Total Protein  6.5 - 8.1 g/dL   7.2   Total Bilirubin 0.3 - 1.2 mg/dL   0.5   Alkaline Phos 38 - 126 U/L   85   AST 15 - 41 U/L   11   ALT 0 - 44 U/L   10     Assessment/Plan:   Principal Problem:   Acute pyelonephritis Active Problems:   Type 2 diabetes mellitus with hyperglycemia, with long-term current use of insulin (HCC)   Diabetic gastroparesis (HCC)   Anxiety   Abdominal pain   Chronic pain   Acute on chronic kidney failure ([place])   AKI (acute kidney injury) [facility])   Patient Summary: [name] is a 56 yo F with a PMH of T2DM c/b gastroparesis, HTN, HLD, paroxysmal atrial fibrillation on chronic anticoagulation with eliquis, CKD stage 3b, class 3 obesity, chronic pain, h/o spinal stenosis mostly wheel chair bound, HFpEF presenting with abdominal pain and admitted for AKI.  #Acute Pyelonephritis WBC 13.2>14.4. Afebrile. Abdominal pain likely multifactorial including gastroparesis, constipation, neuropathy, and now pyelonephritis. Will monitor closely in case of possible colitis underlying chronic abdominal pain. -Post void residual  -Ancef 1 g q8 hour -Senna nightly as needed -Pantoprazole 40 mg daily -Cymbalta 40 mg qd -IV dilaudid 1 mg q4 hour prn for  breakthrough pain -Dilaudid p.o. 4 mg q8 hour scheduled -Simethicone q6 hour prn for flatulence -Advance diet as tolerated -Continue outpatient GI care in [place] outpatient pain clinic -Repeat UA  #Acute on Chronic Abdominal Pain Given clinically improving, will continue to monitor -CT abdomen if acutely worsening again  #AKI on CKD3b Cr 1.72>3.08 (baseline ~2). Uncertain whether this is poor PO hydration vs urinary retention vs pyelonephritis vs multifactorial. 6/13 post void was 0 ml. Appears to be clinical improving -AM BMP -Encourage PO hydration -Continue to monitor  #HTN Normotensive today -Continue to monitor -Continue home hydralazine 50 mg 3 times daily -Continue home Imdur 60 mg daily -Continue home metoprolol XL 50 mg daily  #T2DM -Continue Semglee 32 u -SSI & CBG with 10 unit meal coverage qac and qhs  #PAF -Continue home Eliquis  #HFpEF LVEF 50-55% Home med: torsemide 40 mg qd, aldactone 12.5 mg qd -Continue to monitor fluid status -Holding torsemide and aldactone in setting of AKI  #IDA #Anemia of Chronic Disease Concern for sickle cell disease. Tsat 16%. CKD likely contributory. Elevated ferritin likely secondary to inflammatory response. Tech and path read of smear unremarkable -Iron supplementation  Diet: Carb-Modified IVF: None,None VTE: Eliquis Code: Full PT/OT recs: None, none. TOC recs: none  Dispo: Anticipated discharge to Home in 1-2 days pending pain control and pyelonephritis   France Ravens, MD 08/20/2021, 7:16 AM Pager: [phone]  Please contact the on call pager after 5 pm and on weekends at [phone].

## 2021-08-20 NOTE — Inpatient Diabetes Management (Signed)
Inpatient Diabetes Program Recommendations  AACE/ADA: New Consensus Statement on Inpatient Glycemic Control   Target Ranges:  Prepandial:   less than 140 mg/dL      Peak postprandial:   less than 180 mg/dL (1-2 hours)      Critically ill patients:  140 - 180 mg/dL    Latest Reference Range & Units 08/20/21 08:20 08/20/21 12:08  Glucose-Capillary 70 - 99 mg/dL 125 (H) 215 (H)    Latest Reference Range & Units 08/19/21 08:00 08/19/21 11:33 08/19/21 16:24 08/19/21 22:12  Glucose-Capillary 70 - 99 mg/dL 180 (H) 159 (H) 170 (H) 249 (H)   Review of Glycemic Control  Diabetes history: DM2 Outpatient Diabetes medications: Lantus 50 units QHS, Novolog 30 units QID Current orders for Inpatient glycemic control: Semglee 32 units QHS, Novolog 0-15 units TID with meals, Novolog 10 units TID with meals   Inpatient Diabetes Program Recommendations:     Insulin: Please consider increasing meal coverage to Novolog 13 units TID with meals.  Thanks, Barnie Alderman, RN, MSN, [place] Diabetes Coordinator Inpatient Diabetes Program [phone] (Team Pager from 8am to [place])

## 2021-08-20 NOTE — Plan of Care (Signed)
  Problem: Health Behavior/Discharge Planning: Goal: Ability to manage health-related needs will improve Outcome: Progressing   Problem: Metabolic: Goal: Ability to maintain appropriate glucose levels will improve Outcome: Progressing   Problem: Nutritional: Goal: Maintenance of adequate nutrition will improve Outcome: Progressing   Problem: Education: Goal: Knowledge of General Education information will improve Description: Including pain rating scale, medication(s)/side effects and non-pharmacologic comfort measures Outcome: Progressing   Problem: Health Behavior/Discharge Planning: Goal: Ability to manage health-related needs will improve Outcome: Progressing   Problem: Clinical Measurements: Goal: Cardiovascular complication will be avoided Outcome: Progressing   Problem: Clinical Measurements: Goal: Respiratory complications will improve Outcome: Progressing   Problem: Nutrition: Goal: Adequate nutrition will be maintained Outcome: Progressing   Problem: Coping: Goal: Level of anxiety will decrease Outcome: Progressing   Problem: Elimination: Goal: Will not experience complications related to bowel motility Outcome: Progressing Goal: Will not experience complications related to urinary retention Outcome: Progressing   Problem: Pain Managment: Goal: General experience of comfort will improve Outcome: Progressing   Problem: Safety: Goal: Ability to remain free from injury will improve Outcome: Progressing

## 2021-08-20 NOTE — Progress Notes (Signed)
Mobility Specialist Progress Note:   08/20/21 1150  Mobility  Activity Dangled on edge of bed  Range of Motion/Exercises Active;Right leg;Left leg  Level of Assistance Independent  Assistive Device None  Activity Response Tolerated well  $Mobility charge 1 Mobility   Pt still resistant to any mobility. Sitting EOB upon arrival, performed BLE strengthening exercises. Pt eager to sit up in chair at first, but after realizing she would have to walk around bed, she declined. Pt left with NT in room. All needs met.  Nelta Numbers Acute [facility] Secure Chat or Office Phone: [phone]

## 2021-08-21 ENCOUNTER — Inpatient Hospital Stay (HOSPITAL_COMMUNITY): Payer: Medicare Other

## 2021-08-21 DIAGNOSIS — R109 Unspecified abdominal pain: Secondary | ICD-10-CM | POA: Diagnosis not present

## 2021-08-21 DIAGNOSIS — N10 Acute tubulo-interstitial nephritis: Secondary | ICD-10-CM | POA: Diagnosis not present

## 2021-08-21 DIAGNOSIS — Z794 Long term (current) use of insulin: Secondary | ICD-10-CM

## 2021-08-21 DIAGNOSIS — E1165 Type 2 diabetes mellitus with hyperglycemia: Secondary | ICD-10-CM

## 2021-08-21 DIAGNOSIS — N179 Acute kidney failure, unspecified: Secondary | ICD-10-CM | POA: Diagnosis not present

## 2021-08-21 LAB — BASIC METABOLIC PANEL
Anion gap: 13 (ref 5–15)
BUN: 23 mg/dL — ABNORMAL HIGH (ref 6–20)
CO2: 19 mmol/L — ABNORMAL LOW (ref 22–32)
Calcium: 8.3 mg/dL — ABNORMAL LOW (ref 8.9–10.3)
Chloride: 105 mmol/L (ref 98–111)
Creatinine, Ser: 4.25 mg/dL — ABNORMAL HIGH (ref 0.44–1.00)
GFR, Estimated: 12 mL/min — ABNORMAL LOW (ref >60)
Glucose, Bld: 215 mg/dL — ABNORMAL HIGH (ref 70–99)
Potassium: 4 mmol/L (ref 3.5–5.1)
Sodium: 137 mmol/L (ref 135–145)

## 2021-08-21 LAB — CULTURE, BLOOD (ROUTINE X 2)
Culture: NO GROWTH
Culture: NO GROWTH
Special Requests: ADEQUATE
Special Requests: ADEQUATE

## 2021-08-21 LAB — CBC
HCT: 26.7 % — ABNORMAL LOW (ref 36.0–46.0)
Hemoglobin: 8.3 g/dL — ABNORMAL LOW (ref 12.0–15.0)
MCH: 30.2 pg (ref 26.0–34.0)
MCHC: 31.1 g/dL (ref 30.0–36.0)
MCV: 97.1 fL (ref 80.0–100.0)
Platelets: 309 10*3/uL (ref 150–400)
RBC: 2.75 MIL/uL — ABNORMAL LOW (ref 3.87–5.11)
RDW: 13 % (ref 11.5–15.5)
WBC: 12.9 10*3/uL — ABNORMAL HIGH (ref 4.0–10.5)
nRBC: 0 % (ref 0.0–0.2)

## 2021-08-21 LAB — GLUCOSE, CAPILLARY
Glucose-Capillary: 189 mg/dL — ABNORMAL HIGH (ref 70–99)
Glucose-Capillary: 163 mg/dL — ABNORMAL HIGH (ref 70–99)
Glucose-Capillary: 100 mg/dL — ABNORMAL HIGH (ref 70–99)
Glucose-Capillary: 58 mg/dL — ABNORMAL LOW (ref 70–99)
Glucose-Capillary: 169 mg/dL — ABNORMAL HIGH (ref 70–99)
Glucose-Capillary: 180 mg/dL — ABNORMAL HIGH (ref 70–99)

## 2021-08-21 LAB — MAGNESIUM: Magnesium: 1.9 mg/dL (ref 1.7–2.4)

## 2021-08-21 MED ORDER — APIXABAN 5 MG PO TABS
5.0000 mg | ORAL_TABLET | Freq: Two times a day (BID) | ORAL | Status: DC
  Administered 2021-08-21 – 2021-08-28 (×14): 5 mg via ORAL
  Filled 2021-08-21 (×14): qty 1

## 2021-08-21 MED ORDER — SODIUM CHLORIDE 0.9 % IV SOLN
2.0000 g | INTRAVENOUS | Status: DC
  Administered 2021-08-21 – 2021-08-25 (×5): 2 g via INTRAVENOUS
  Filled 2021-08-21 (×5): qty 20

## 2021-08-21 MED ORDER — APIXABAN 2.5 MG PO TABS: 2.5000 mg | ORAL_TABLET | Freq: Two times a day (BID) | ORAL | Status: DC

## 2021-08-21 MED ORDER — LACTATED RINGERS IV BOLUS
500.0000 mL | Freq: Once | INTRAVENOUS | Status: AC
  Administered 2021-08-21: 500 mL via INTRAVENOUS

## 2021-08-21 MED ORDER — HYDROMORPHONE HCL 2 MG PO TABS
4.0000 mg | ORAL_TABLET | ORAL | Status: DC | PRN
  Administered 2021-08-21 – 2021-08-22 (×2): 4 mg via ORAL
  Filled 2021-08-21 (×4): qty 2

## 2021-08-21 MED ORDER — HYDROMORPHONE HCL 1 MG/ML IJ SOLN
1.0000 mg | INTRAMUSCULAR | Status: DC | PRN
  Administered 2021-08-21 – 2021-08-22 (×6): 1 mg via INTRAVENOUS
  Filled 2021-08-21 (×7): qty 1

## 2021-08-21 NOTE — Progress Notes (Addendum)
HD#5 Subjective:  Overnight Events: NAEON  Continues to endorse abdominal and back pain but does report that it is better controlled today.  Reports having a rough night last night but better this morning. Continues to endorse persistent nausea but denies vomiting yesterday.  Discussed plan to have a renal ultrasound and a urine sodium to assess for kidney function given worsening creatinine.  Objective:  Vital signs in last 24 hours: Vitals:   08/20/21 2056 08/20/21 2234 08/21/21 0453 08/21/21 0837  BP: 111/68  (!) 145/71 132/76  Pulse: 84  94 96  Resp: '20  18 18  '$ Temp: 97.6 F (36.4 C) 97.7 F (36.5 C) (!) 97.3 F (36.3 C) 97.7 F (36.5 C)  TempSrc: Oral Oral Oral Oral  SpO2: 100%  100% 100%  Weight:      Height:       Supplemental O2: Room Air SpO2: 100 %   Physical Exam:  Physical Exam Constitutional:      Appearance: Normal appearance. She is obese. She is ill-appearing.  HENT:     Head: Normocephalic and atraumatic.     Nose: Nose normal. No congestion or rhinorrhea.  Abdominal:     General: Bowel sounds are normal. There is no distension.     Palpations: Abdomen is soft. There is no mass.     Tenderness: There is abdominal tenderness. There is right CVA tenderness and left CVA tenderness. There is no guarding or rebound.     Hernia: No hernia is present.  Musculoskeletal:        General: Swelling present.     Comments: Mild pitting edema of BLE  Neurological:     Mental Status: She is alert.     Filed Weights   08/15/21 1852  Weight: 113.4 kg     Intake/Output Summary (Last 24 hours) at 08/21/2021 1134 Last data filed at 08/21/2021 5102 Gross per 24 hour  Intake 2504.89 ml  Output --  Net 2504.89 ml   Net IO Since Admission: 6,649.4 mL [08/21/21 1134]  Pertinent Labs:    Latest Ref Rng & Units 08/21/2021    2:45 AM 08/20/2021   12:11 AM 08/19/2021    6:04 AM  CBC  WBC 4.0 - 10.5 K/uL 12.9  14.4  13.2   Hemoglobin 12.0 - 15.0 g/dL 8.3  8.5   8.5   Hematocrit 36.0 - 46.0 % 26.7  26.3  25.8   Platelets 150 - 400 K/uL 309  364  332        Latest Ref Rng & Units 08/21/2021    2:45 AM 08/20/2021   12:11 AM 08/19/2021    6:04 AM  CMP  Glucose 70 - 99 mg/dL 215  203  164   BUN 6 - 20 mg/dL '23  21  20   '$ Creatinine 0.44 - 1.00 mg/dL 4.25  3.08  1.72   Sodium 135 - 145 mmol/L 137  134  138   Potassium 3.5 - 5.1 mmol/L 4.0  3.2  3.7   Chloride 98 - 111 mmol/L 105  98  104   CO2 22 - 32 mmol/L '19  23  24   '$ Calcium 8.9 - 10.3 mg/dL 8.3  8.6  9.1     Imaging: No results found.  Assessment/Plan:   Principal Problem:   Acute pyelonephritis Active Problems:   Type 2 diabetes mellitus with hyperglycemia, with long-term current use of insulin (HCC)   Diabetic gastroparesis (HCC)   Anxiety   Abdominal  pain   Chronic pain   Acute on chronic kidney failure ([place])   AKI (acute kidney injury) [facility])   Patient Summary: Deborah [name] is a 56 yo F with a PMH of T2DM c/b gastroparesis, HTN, HLD, paroxysmal atrial fibrillation on chronic anticoagulation with eliquis, CKD stage 3b, class 3 obesity, chronic pain, h/o spinal stenosis mostly wheel chair bound, HFpEF presenting with abdominal pain and admitted for AKI.  #AKI on CKD3b Cr 3.08>4.25 (baseline ~2). Uncertain whether this is poor PO hydration vs urinary retention vs pyelonephritis vs multifactorial. 6/13 and 6/15 post void was 0 ml. Appears to be clinical improving. Reports urinating well.  -AM BMP -Renal U/S to assess for obstruction -Encourage PO hydration -IVF 500 ml bolus -Continue to monitor  #Acute Pyelonephritis WBC 14.4>12.9. Afebrile. Abdominal pain likely multifactorial including gastroparesis, constipation, neuropathy, and pyelonephritis. Will monitor closely in case of possible colitis underlying chronic abdominal pain. -Ancef 1 g q8 hour, transition to PO tomorrow -Senna nightly as needed -Pantoprazole 40 mg daily -Cymbalta 40 mg qd -IV dilaudid 1 mg q4  hour prn for breakthrough pain -Dilaudid p.o. 4 mg q8 hour scheduled -Simethicone q6 hour prn for flatulence -Advance diet as tolerated -Continue outpatient GI care in [place] outpatient pain clinic   #Acute on Chronic Abdominal Pain Given clinically improving, will continue to monitor -CT abdomen if acutely worsening again   #HTN Normotensive today -Continue to monitor -Continue home hydralazine 50 mg 3 times daily -Continue home Imdur 60 mg daily -Continue home metoprolol XL 50 mg daily   #T2DM -Continue Semglee 32 u -SSI & CBG with 10 unit meal coverage qac and qhs   #PAF -Continue home Eliquis   #HFpEF LVEF 50-55% Home med: torsemide 40 mg qd, aldactone 12.5 mg qd -Continue to monitor fluid status -Holding torsemide and aldactone in setting of AKI   #IDA #Anemia of Chronic Disease Tsat 16%. CKD likely contributory. Elevated ferritin likely secondary to inflammatory response. Tech and path read of smear unremarkable -Iron supplementation   Diet: Carb-Modified IVF: 500 ml bolus VTE: Eliquis Code: Full PT/OT recs: None, none. TOC recs: none   Dispo: Anticipated discharge to Home in 1 days pending improvement in pain and improving kidney function.   France Ravens, MD 08/21/2021, 11:34 AM Pager: [phone]  Please contact the on call pager after 5 pm and on weekends at [phone].

## 2021-08-21 NOTE — Progress Notes (Signed)
IV team to bedside per consult, patient requesting upper arm IV, refusing IV placement lower than AC at this time. Pt is not a candidate for midline or PICC line given creatinine clearance of 16.5 at this time. This RN provided education, pt verbalized understanding that upper arm access not recommended at this time. Current IV in [facility] working properly, pt agreeable to keep IV in [facility], educated patient on need to keep arm straight for IV to remain intact and patent. Spoke with [facility] LPN caring for patient, agreeable with plan to keep IV in [facility] for now, advised to consult again if access needed in the future.

## 2021-08-22 ENCOUNTER — Inpatient Hospital Stay (HOSPITAL_COMMUNITY): Payer: Medicare Other

## 2021-08-22 ENCOUNTER — Encounter (HOSPITAL_COMMUNITY): Payer: Self-pay | Admitting: Student in an Organized Health Care Education/Training Program

## 2021-08-22 DIAGNOSIS — N10 Acute tubulo-interstitial nephritis: Secondary | ICD-10-CM | POA: Diagnosis not present

## 2021-08-22 LAB — BASIC METABOLIC PANEL
Anion gap: 15 (ref 5–15)
BUN: 29 mg/dL — ABNORMAL HIGH (ref 6–20)
CO2: 18 mmol/L — ABNORMAL LOW (ref 22–32)
Calcium: 8.4 mg/dL — ABNORMAL LOW (ref 8.9–10.3)
Chloride: 100 mmol/L (ref 98–111)
Creatinine, Ser: 5.09 mg/dL — ABNORMAL HIGH (ref 0.44–1.00)
GFR, Estimated: 9 mL/min — ABNORMAL LOW (ref >60)
Glucose, Bld: 281 mg/dL — ABNORMAL HIGH (ref 70–99)
Potassium: 3.8 mmol/L (ref 3.5–5.1)
Sodium: 133 mmol/L — ABNORMAL LOW (ref 135–145)

## 2021-08-22 LAB — CBC
HCT: 25.5 % — ABNORMAL LOW (ref 36.0–46.0)
Hemoglobin: 8.2 g/dL — ABNORMAL LOW (ref 12.0–15.0)
MCH: 30.9 pg (ref 26.0–34.0)
MCHC: 32.2 g/dL (ref 30.0–36.0)
MCV: 96.2 fL (ref 80.0–100.0)
Platelets: 331 10*3/uL (ref 150–400)
RBC: 2.65 MIL/uL — ABNORMAL LOW (ref 3.87–5.11)
RDW: 13.2 % (ref 11.5–15.5)
WBC: 14.1 10*3/uL — ABNORMAL HIGH (ref 4.0–10.5)
nRBC: 0 % (ref 0.0–0.2)

## 2021-08-22 LAB — GLUCOSE, CAPILLARY
Glucose-Capillary: 219 mg/dL — ABNORMAL HIGH (ref 70–99)
Glucose-Capillary: 199 mg/dL — ABNORMAL HIGH (ref 70–99)
Glucose-Capillary: 198 mg/dL — ABNORMAL HIGH (ref 70–99)

## 2021-08-22 MED ORDER — INSULIN ASPART 100 UNIT/ML IJ SOLN
15.0000 [IU] | Freq: Three times a day (TID) | INTRAMUSCULAR | Status: DC
  Administered 2021-08-22 – 2021-08-24 (×5): 15 [IU] via SUBCUTANEOUS

## 2021-08-22 MED ORDER — ONDANSETRON HCL 4 MG/2ML IJ SOLN
4.0000 mg | Freq: Four times a day (QID) | INTRAMUSCULAR | Status: DC | PRN
  Administered 2021-08-22 – 2021-08-28 (×9): 4 mg via INTRAVENOUS
  Filled 2021-08-22 (×9): qty 2

## 2021-08-22 MED ORDER — PROCHLORPERAZINE EDISYLATE 10 MG/2ML IJ SOLN
10.0000 mg | Freq: Four times a day (QID) | INTRAMUSCULAR | Status: DC | PRN
  Administered 2021-08-22 – 2021-08-27 (×4): 10 mg via INTRAVENOUS
  Filled 2021-08-22 (×6): qty 2

## 2021-08-22 MED ORDER — HYDRALAZINE HCL 25 MG PO TABS
25.0000 mg | ORAL_TABLET | Freq: Three times a day (TID) | ORAL | Status: DC
  Administered 2021-08-22: 25 mg via ORAL
  Filled 2021-08-22: qty 1

## 2021-08-22 NOTE — Plan of Care (Signed)
  Problem: Fluid Volume: Goal: Ability to maintain a balanced intake and output will improve Outcome: Progressing   Problem: Nutritional: Goal: Maintenance of adequate nutrition will improve Outcome: Progressing   Problem: Education: Goal: Knowledge of General Education information will improve Description: Including pain rating scale, medication(s)/side effects and non-pharmacologic comfort measures Outcome: Progressing   Problem: Activity: Goal: Risk for activity intolerance will decrease Outcome: Progressing   Problem: Coping: Goal: Level of anxiety will decrease Outcome: Progressing   Problem: Elimination: Goal: Will not experience complications related to bowel motility Outcome: Progressing Goal: Will not experience complications related to urinary retention Outcome: Progressing   Problem: Pain Managment: Goal: General experience of comfort will improve Outcome: Progressing

## 2021-08-22 NOTE — Consult Note (Addendum)
Renal Service Consult Note [facility]  Deborah [name] 08/22/2021 Sol Blazing, MD Requesting Physician: Dr. Saverio Danker  Reason for Consult: Renal failure HPI: The patient is a 56 y.o. year-old w/ hx of anemia, carcinoid tumor of duodenum, CKD, systolic CHF, DM2, FM, PUD, gastroparesis, spinal stenosis, debility/ WC bound, HL, HTN, sp CVA, PAF, NICM admitted on 6/10 for abdominal pain, AKI on CKD w/ baseline creat of 2.5 and creatinine on admission of 4.1. UCx grew [facility] and pt has been treated for suspected pyelonephritis. Creat improved from 4.1 to 1.72 on 6/14, but is now rising again up to 3.1 > 4.2 yesterday and 5.1 today.  We are asked to see for renal failure.   Pt seen in room. Pt lives alone, takes care of herself, no tob/ etoh. No SOB, tremors, CP or abd pain. Has not been voiding as much the last few days here. She is f/b Dr Candiss Norse at [facility]. No nsaids. Mild nausea.     ROS - denies CP, no joint pain, no HA, no blurry vision, no rash, no diarrhea, no nausea/ vomiting   Past Medical History  Past Medical History:  Diagnosis Date   Acute back pain with sciatica, left    Acute back pain with sciatica, right    AKI (acute kidney injury) ([place])    Anemia, unspecified    Cancer (HCC)    Carcinoid tumor of duodenum    Chest pain with normal coronary angiography 2019   Chronic kidney disease, stage 3b (HCC)    Chronic pain    Chronic systolic CHF (congestive heart failure) (HCC)    Diabetes mellitus    DKA (diabetic ketoacidosis) ([place])    Drug-seeking behavior    21 hospitalizations and 14 CT a/p in 2 years for N/V and abdominal pain, demanding only IV dilaudid   Elevated troponin    chronic   Esophageal reflux    Fibromyalgia    Gastric ulcer    Gastroparesis    Gout    Hyperlipidemia    Hypertension    Hypokalemia    Hypomagnesemia    Lumbosacral stenosis    LVH (left ventricular hypertrophy)    Morbid obesity (HCC)    NICM (nonischemic  cardiomyopathy) (HCC)    PAF (paroxysmal atrial fibrillation) ([place])    Stroke ([place]) 02/2011   Thrombocytosis    Vitamin B12 deficiency anemia    Past Surgical History  Past Surgical History:  Procedure Laterality Date   BIOPSY  07/27/2019   Procedure: BIOPSY;  Surgeon: Clarene Essex, MD;  Location: [facility];  Service: Endoscopy;;   BIOPSY  07/30/2019   Procedure: BIOPSY;  Surgeon: Otis Brace, MD;  Location: [facility];  Service: Gastroenterology;;   CATARACT EXTRACTION  01/2014   CHOLECYSTECTOMY     COLONOSCOPY WITH PROPOFOL N/A 07/30/2019   Procedure: COLONOSCOPY WITH PROPOFOL;  Surgeon: Otis Brace, MD;  Location: [facility];  Service: Gastroenterology;  Laterality: N/A;   ESOPHAGOGASTRODUODENOSCOPY N/A 07/27/2019   Procedure: ESOPHAGOGASTRODUODENOSCOPY (EGD);  Surgeon: Clarene Essex, MD;  Location: [facility];  Service: Endoscopy;  Laterality: N/A;   ESOPHAGOGASTRODUODENOSCOPY N/A 07/26/2020   Procedure: ESOPHAGOGASTRODUODENOSCOPY (EGD);  Surgeon: Arta Silence, MD;  Location: [facility];  Service: Endoscopy;  Laterality: N/A;   ESOPHAGOGASTRODUODENOSCOPY (EGD) WITH PROPOFOL N/A 08/02/2019   Procedure: ESOPHAGOGASTRODUODENOSCOPY (EGD) WITH PROPOFOL;  Surgeon: Otis Brace, MD;  Location: [facility];  Service: Gastroenterology;  Laterality: N/A;   HEMOSTASIS CLIP PLACEMENT  08/02/2019   Procedure: HEMOSTASIS CLIP PLACEMENT;  Surgeon:  Otis Brace, MD;  Location: [facility];  Service: Gastroenterology;;   POLYPECTOMY  07/30/2019   Procedure: POLYPECTOMY;  Surgeon: Otis Brace, MD;  [facility]: [facility];  Service: Gastroenterology;;   POLYPECTOMY  08/02/2019   Procedure: POLYPECTOMY;  Surgeon: Otis Brace, MD;  Location: [facility];  Service: Gastroenterology;;   Family History  Family History  Problem Relation Age of Onset   Diabetes Mother    Diabetes Father    Heart disease Father    Diabetes Sister    Congestive Heart Failure Sister  7   Diabetes Brother    Social History  reports that she has never smoked. She has never used smokeless tobacco. She reports that she does not drink alcohol and does not use drugs. Allergies  Allergies  Allergen Reactions   Diazepam [name]   Gabapentin [name] and Swelling    Other reaction(s): Unknown   Iodinated Contrast Media Anaphylaxis    11/29/17 Cardiac arrest 1 min after IV contrast, possible allergy vs vasovagal episode Iopamidol  Anaphylaxis  High 11/28/2017  Patient had seizure like activity and then code post 100 cc of isovue 300     Isovue [Iopamidol] Anaphylaxis    11/28/17 Patient had seizure like activity and then 1 min code after 100 cc of isovue 300. Possible contrast allergy vs vasovagal episode   Lisinopril Anaphylaxis    Tongue and mouth swelling   Metoclopramide Other (See Comments)    Tardive dyskinesia   Nsaids Anaphylaxis and Other (See Comments)    ULCER   Penicillins Palpitations    Has patient had a PCN reaction causing immediate rash, facial/tongue/throat swelling, SOB or lightheadedness with hypotension: Yes, heart races Has patient had a PCN reaction causing severe rash involving mucus membranes or skin necrosis: No Has patient had a PCN reaction that required hospitalization: Yes  Has patient had a PCN reaction occurring within the last 10 years: No    Tolmetin Nausea Only and Other (See Comments)    ULCER   Cyclobenzaprine     Other reaction(s): Unknown   Rifamycins Other (See Comments)    unknown   Acetaminophen Nausea Only and Other (See Comments)    Irritates stomach ulcer; Abdominal pain   Oxycodone Palpitations   Tramadol Nausea And Vomiting   Home medications Prior to Admission medications   Medication Sig Start Date End Date Taking? Authorizing Provider  albuterol (PROVENTIL) (2.5 MG/3ML) 0.083% nebulizer solution Take 3 mLs (2.5 mg total) by nebulization every 6 (six) hours as needed for wheezing or shortness  of breath. 04/06/19  Yes Scot Jun, FNP  atorvastatin (LIPITOR) 10 MG tablet TAKE ONE TABLET BY MOUTH ONCE DAILY (AM) Patient taking differently: Take 10 mg by mouth daily. 10/22/20  Yes Alcus Dad, MD  baclofen (LIORESAL) 10 MG tablet Take 1 tablet (10 mg total) by mouth 3 (three) times daily. 02/20/21  Yes Raulkar, Clide Deutscher, MD  blood glucose meter kit and supplies KIT Dispense based on patient and insurance preference. Use up to four times daily as directed. (FOR ICD-9 250.00, 250.01). 12/13/18  Yes [name], MD  cetirizine (ZYRTEC) 10 MG tablet TAKE 1 TABLET (10 MG TOTAL) BY MOUTH DAILY (AM) Patient taking differently: Take 10 mg by mouth daily. 06/30/21  Yes Raulkar, Clide Deutscher, MD  DULoxetine HCl 40 MG CPEP TAKE 40 MG BY MOUTH IN THE MORNING AND AT BEDTIME. 06/30/21  Yes Raulkar, Clide Deutscher, MD  EASY COMFORT PEN NEEDLES 31G X 5 MM MISC  USE 3 TIMES A DAY FOR INSULIN ADMINISTRATION 11/14/19  Yes Meccariello, Bernita Raisin, DO  ELIQUIS 5 MG TABS tablet TAKE 1 TABLET BY MOUTH 2 (TWO) TIMES DAILY. Patient taking differently: Take 5 mg by mouth 2 (two) times daily. 12/20/19  Yes Meccariello, Bernita Raisin, DO  famotidine (PEPCID) 40 MG tablet Take 40 mg by mouth daily. 04/03/21 04/03/22 Yes [provider]  fluticasone (FLONASE) 50 MCG/ACT nasal spray Place 2 sprays into both nostrils daily as needed for allergies or rhinitis. Patient taking differently: Place 1 spray into both nostrils daily. 12/19/18  Yes Rai, Ripudeep K, MD  folic acid (FOLVITE) 1 MG tablet Take 1 mg by mouth daily. 04/07/21  Yes [provider]  hydrALAZINE (APRESOLINE) 50 MG tablet TAKE 1 TABLET (50 MG TOTAL) BY MOUTH 3 (THREE) TIMES DAILY. (AM+NOON+BEDTIME) Patient taking differently: Take 50 mg by mouth 3 (three) times daily. 06/30/21  Yes Raulkar, Clide Deutscher, MD  HYDROmorphone (DILAUDID) 4 MG tablet Take 1 tablet (4 mg total) by mouth 2 (two) times daily. May take an extra tablet when pain is severe 10 days  out of the month . Please fill on 07/31/2021 07/28/21  Yes Bayard Hugger, NP  isosorbide mononitrate (IMDUR) 60 MG 24 hr tablet TAKE 1 TABLET (60 MG TOTAL) BY MOUTH DAILY (AM) Patient taking differently: Take 60 mg by mouth daily. 06/30/21  Yes Raulkar, Clide Deutscher, MD  LANTUS SOLOSTAR 100 UNIT/ML Solostar Pen Inject 20 Units into the skin at bedtime. Patient taking differently: Inject 50 Units into the skin at bedtime. 04/20/21  Yes Jennye Boroughs, MD  meclizine (ANTIVERT) 25 MG tablet Take 1 tablet (25 mg total) by mouth 3 (three) times daily as needed for dizziness. 02/20/21  Yes Raulkar, Clide Deutscher, MD  metoCLOPramide (REGLAN) 5 MG tablet TAKE 1 TABLET (5 MG TOTAL) BY MOUTH 4 TIMES DAILY. (AM+NOON+EVENING+BEDTIME) Patient taking differently: Take 5 mg by mouth 4 (four) times daily -  before meals and at bedtime. 07/28/21  Yes Raulkar, Clide Deutscher, MD  metoprolol succinate (TOPROL-XL) 50 MG 24 hr tablet Take 1 tablet (50 mg total) by mouth daily. Take with or immediately following a meal. 11/24/20 08/15/21 Yes Enzo Bi, MD  NOVOLOG FLEXPEN 100 UNIT/ML FlexPen Inject 10 Units into the skin 3 (three) times daily with meals. Patient taking differently: Inject 30 Units into the skin 4 (four) times daily -  before meals and at bedtime. 04/20/21  Yes Jennye Boroughs, MD  ondansetron (ZOFRAN ODT) 4 MG disintegrating tablet Take 1 tablet (4 mg total) by mouth every 8 (eight) hours as needed for nausea or vomiting. 05/30/21  Yes Sherwood Gambler, MD  pantoprazole (PROTONIX) 40 MG tablet TAKE 1 TABLET (40 MG TOTAL) BY MOUTH 2 (TWO) TIMES DAILY. (AM+BEDTIME) Patient taking differently: Take 40 mg by mouth 2 (two) times daily. 06/30/21  Yes Raulkar, Clide Deutscher, MD  promethazine (PHENERGAN) 25 MG tablet Take 1 tablet (25 mg total) by mouth every 6 (six) hours as needed for nausea or vomiting. 05/30/21  Yes Sherwood Gambler, MD  SENNA PLUS 8.6-50 MG tablet Take 1 tablet by mouth 2 (two) times daily. 05/19/21  Yes [provider]  sodium bicarbonate 650 MG tablet Take 1 tablet (650 mg total) by mouth 2 (two) times daily. 04/20/21  Yes Jennye Boroughs, MD  spironolactone (ALDACTONE) 25 MG tablet Take 12.5 mg by mouth daily. 01/23/21  Yes [provider]  tiZANidine (ZANAFLEX) 4 MG tablet Take 4 mg by mouth 3 (three) times  daily as needed for muscle spasms. 08/11/20  Yes [provider]  torsemide (DEMADEX) 20 MG tablet Take 40 mg by mouth daily. 05/19/21  Yes [provider]  Vitamin D, Ergocalciferol, (DRISDOL) 1.25 MG (50000 UNIT) CAPS capsule Take 50,000 Units by mouth every Monday. 04/07/21  Yes [provider]     Vitals:   08/21/21 2028 08/22/21 0520 08/22/21 0820 08/22/21 1647  BP: 115/70 140/88 139/74 109/60  Pulse: 90 95 92 92  Resp: _0 Temp: 97.6 F (36.4 C)  (!) 97.5 F (36.4 C) 97.6 F (36.4 C)  TempSrc: Oral  Oral Axillary  SpO2: 100% 100% 100% 100%  Weight:      Height:       Exam Gen alert, no distress, obese, up in chair, pleasant No rash, cyanosis or gangrene Sclera anicteric, throat clear  No jvd or bruits Chest clear bilat to bases, no rales/ wheezing RRR no MRG Abd soft ntnd no mass or ascites +bs GU defer MS no joint effusions or deformity Ext 1-2+ pretib edema bilat, no hip or other edema, no wounds or ulcers Neuro is alert, Ox 3 , nf, no asterixis          Home meds include - atorvastatin, eliquis, famotidine, hydralazine 50 tid, isosorbide mononitrate 60, lantus insulin, metoclopramide, metoprolol xl 50, insulin novolog, pantoprazole, spironolactone 12.5, torsemide 40 qd, prns        Date   Creat  eGFR      2011-16   0.7- 1.0      2017   0.9- 1.6      2018   0.8- 1.7      2019   1.0- 1.75      2020   1.36- 1.83      2021   1.11- 2.51      Jan-jun 2022  2.44- 4.38      July -dec 2022  1.20- 2.95      Feb- Aug 02, 2021  1.84- 2.54 21- 32 ml/min,        Total I/O's = 8 L in and nothing recorded out = + 8L    Admit  BP's high 170/ 80, last 3 days BP's lower 110- 140/ 58- 70. No BP's < 100.    Has been getting hydralazine and po metoprolol xl for last 7 days   IV abx = IV rocephin 6/10- 6/11   IV ancef 613- 6/16   CT abd wo 6/10 - Adrenals/Urinary Tract: Adrenal glands are unremarkable. Kidneys are normal, without renal calculi, focal lesion, or hydronephrosis. Bladder is unremarkable.    CT abd wo 6/13 - Adrenals/Urinary Tract: There is no adrenal mass no focal abnormality in the unenhanced renal cortex. Perinephric stranding does appear slightly increased today which could be due to inflammation or changes in fluid balance. There is no evidence of urinary stones or obstruction. There is no bladder thickening.    CXR 6/10 - IMPRESSION: No acute cardiopulmonary process.    Renal US 6/22, 6/16 > 11-12 cm kidneys w/o hydro, normal echo    UA 6/10 - turbid, amber, large LE, 100 prot, rare bact, 0-5 rbc, >50 wbc    UCx + EColi, blood cx's negative x 2    Echo last done sept 2022 - LVEF 50-55%  Assessment/ Plan: AKI on CKD 4 - b/l creat 1.84- 2.54, from feb- may 2023, 21- 32 ml/min.  Creat 4.1 on admission in setting of UTI and suspected pyelonephritis. Creat  improved to 1.7 several days later. Now creat rising again up to 4.2 yesterday and 5.1 today. Not voiding much per the patient. UA from 6/10 looked infected. Renal US x 2 w/o obstruction. On exam, vol status is difficult, clear lungs and mild-mod lower leg edema bilat, no other edema. No uremic signs or symptoms. No indication for HD yet. Wts suggest up 25 lbs since admit, but not sure this is accurate. Will place foley, get repeat UA and urine Na/ Cr, get CXR and will reassess.  UTI - ecoli on cx sp course of abx HTN - bp's down over past 2-3 days, will lower dc BB and cont hydralazine for now at lower dose w/ hold orders.  Chronic syst CHF - last EF 50-55%. Doesn't look wet on exam.       Rob Jonnie Finner  MD 08/22/2021, 5:25 PM Recent Labs  Lab  08/16/21 0520 08/16/21 1217 08/18/21 0029 08/19/21 0604 08/21/21 0245 08/22/21 0403  HGB 8.2*   < > 7.8*   < > 8.3* 8.2*  ALBUMIN 3.2*  --  3.0*  --   --   --   CALCIUM 9.5   < > 9.4   < > 8.3* 8.4*  CREATININE 3.63*   < > 2.17*   < > 4.25* 5.09*  K 4.5   < > 3.9   < > 4.0 3.8   < > = values in this interval not displayed.

## 2021-08-22 NOTE — Progress Notes (Signed)
Mobility Specialist Progress Note:   08/22/21 1125  Mobility  Activity Dangled on edge of bed  Level of Assistance Independent  Assistive Device None  Activity Response Tolerated well  $Mobility charge 1 Mobility   Pt not agreeing to much this am. Agreed to sit EOB, would not perform any exercises despite encouragement. Pt left with all needs met.   Nelta Numbers [facility] Secure Chat or Office Phone: [phone]

## 2021-08-22 NOTE — Progress Notes (Signed)
Pt has been unable to give urine sample so far today. Orders have been placed to do a bladder scan and in/out cath. Pt has declined in/out cath and asks that we wait and let her urinate on her own first.

## 2021-08-22 NOTE — Progress Notes (Signed)
Patient need a urine specimen,patient is refusing for a bladder scan and stated 'the Doctor has to wait till I urinate".I;m concern about her situation.

## 2021-08-22 NOTE — Progress Notes (Signed)
HD#6 Subjective:  Overnight Events: NAEON  Continues to endorse abdominal and back pain but does report he continues to improve.  Discussed concern for kidney injury versus kidney failure as patient's creatinine continues to increase despite fluid resuscitation.  Discussed possibility whether this is sign of cardiorenal syndrome. Discussed that we would consult nephrology for further recommendations and patient was amenable to this.  Patient does report decreased urinary frequency now and minimal urine output.  Objective:  Vital signs in last 24 hours: Vitals:   08/21/21 1401 08/21/21 1734 08/21/21 2028 08/22/21 0520  BP: (!) 119/58 118/67 115/70 140/88  Pulse: 95 96 90 95  Resp: '16 18 17 20  '$ Temp: 97.7 F (36.5 C) (!) 97.5 F (36.4 C) 97.6 F (36.4 C)   TempSrc: Oral Oral Oral   SpO2: 99% 100% 100% 100%  Weight:      Height:       Supplemental O2: Room Air SpO2: 100 %   Physical Exam:  Physical Exam Constitutional:      Appearance: Normal appearance. She is obese. She is ill-appearing.  HENT:     Head: Normocephalic and atraumatic.     Nose: Nose normal. No congestion or rhinorrhea.  Abdominal:     General: Bowel sounds are normal. There is no distension.     Palpations: Abdomen is soft. There is no mass.     Tenderness: There is abdominal tenderness. There is right CVA tenderness and left CVA tenderness. There is no guarding or rebound.     Hernia: No hernia is present.  Musculoskeletal:        General: Swelling present.     Comments: Mild pitting edema of BLE  Neurological:     Mental Status: She is alert.    Filed Weights   08/15/21 1852  Weight: 113.4 kg     Intake/Output Summary (Last 24 hours) at 08/22/2021 0643 Last data filed at 08/22/2021 0408 Gross per 24 hour  Intake 1129.04 ml  Output --  Net 1129.04 ml    Net IO Since Admission: 7,778.44 mL [08/22/21 0643]  Pertinent Labs:    Latest Ref Rng & Units 08/22/2021    4:03 AM 08/21/2021     2:45 AM 08/20/2021   12:11 AM  CBC  WBC 4.0 - 10.5 K/uL 14.1  12.9  14.4   Hemoglobin 12.0 - 15.0 g/dL 8.2  8.3  8.5   Hematocrit 36.0 - 46.0 % 25.5  26.7  26.3   Platelets 150 - 400 K/uL 331  309  364        Latest Ref Rng & Units 08/22/2021    4:03 AM 08/21/2021    2:45 AM 08/20/2021   12:11 AM  CMP  Glucose 70 - 99 mg/dL 281  215  203   BUN 6 - 20 mg/dL '29  23  21   '$ Creatinine 0.44 - 1.00 mg/dL 5.09  4.25  3.08   Sodium 135 - 145 mmol/L 133  137  134   Potassium 3.5 - 5.1 mmol/L 3.8  4.0  3.2   Chloride 98 - 111 mmol/L 100  105  98   CO2 22 - 32 mmol/L '18  19  23   '$ Calcium 8.9 - 10.3 mg/dL 8.4  8.3  8.6     Imaging: US RENAL  Result Date: 08/21/2021 CLINICAL DATA:  Acute kidney injury EXAM: RENAL / URINARY TRACT ULTRASOUND COMPLETE COMPARISON:  08/16/2021 FINDINGS: Right Kidney: Renal measurements: 11.4 x 5.6 x 5.7  cm = volume: 189 mL. Echogenicity within normal limits. No mass or hydronephrosis visualized. Left Kidney: Renal measurements: 11.4 x 7.1 x 4.8 cm = volume: 204 mL. Echogenicity within normal limits. No mass or hydronephrosis visualized. Bladder: Appears normal for degree of bladder distention. Other: None. IMPRESSION: No evidence of obstructive uropathy. Electronically Signed   By: Davina Poke D.O.   On: 08/21/2021 13:45    Assessment/Plan:   Principal Problem:   Acute pyelonephritis Active Problems:   Type 2 diabetes mellitus with hyperglycemia, with long-term current use of insulin (HCC)   Diabetic gastroparesis (HCC)   Anxiety   Abdominal pain   Chronic pain   Acute on chronic kidney failure ([place])   AKI (acute kidney injury) [facility])   Patient Summary: Deborah [name] is a 56 yo F with a PMH of T2DM c/b gastroparesis, HTN, HLD, paroxysmal atrial fibrillation on chronic anticoagulation with eliquis, CKD stage 3b, class 3 obesity, chronic pain, h/o spinal stenosis mostly wheel chair bound, HFpEF presenting with abdominal pain and admitted for  AKI.  #AKI on CKD3b Cr 4.25>5.09 (baseline ~2). Uncertain whether this is poor PO hydration vs cardiorenal vs pyelonephritis vs multifactorial. Postvoids have been 0 ml. Renal U/S showed no obstruction. Appears to be clinical improving. Reports urinating less now.  -AM BMP -Nephrology consulted, greatly appreciate assistance and recommendations -Encourage PO hydration -Continue to monitor  #Acute Pyelonephritis WBC 12.9>14.1. Afebrile. Abdominal pain likely multifactorial including gastroparesis, constipation, neuropathy, and pyelonephritis. Will monitor closely in case of possible colitis underlying chronic abdominal pain. -Switch from Ancef to Ceftriaxone as there is rare possibility that ancef may be nephrotoxic -Senna nightly as needed -Pantoprazole 40 mg daily -Cymbalta 40 mg qd -IV dilaudid 1 mg q4 hour prn for breakthrough pain -Dilaudid p.o. 4 mg q8 hour scheduled    #Acute on Chronic Abdominal Pain Given clinically improving, will continue to monitor -CT abdomen if acutely worsening again -Continue outpatient GI care in [place] outpatient pain clinic -Simethicone q6 hour prn for flatulence -Advance diet as tolerated   #HTN VSS on room air -Continue to monitor -Continue home hydralazine 50 mg 3 times daily -Continue home Imdur 60 mg daily -Continue home metoprolol XL 50 mg daily   #T2DM -Continue Semglee 32 u -SSI & CBG with 13 unit meal coverage qac and qhs   #PAF -Continue home Eliquis (pharmacy reports no need to change eliquis dosing to account for renal function as patient does not meet age range or BMI to need to lower dose)   #HFpEF LVEF 50-55% Home med: torsemide 40 mg qd, aldactone 12.5 mg qd -Continue to monitor fluid status -Holding torsemide and aldactone in setting of AKI   #IDA #Anemia of Chronic Disease Tsat 16%. CKD likely contributory. Elevated ferritin likely secondary to inflammatory response. Tech and path read of smear  unremarkable -Iron supplementation   Diet: Carb-Modified IVF: none VTE: Eliquis Code: Full PT/OT recs: None, none. TOC recs: none   Dispo: Anticipated discharge to Home in 1 days pending improvement in pain and improving kidney function.   France Ravens, MD 08/22/2021, 6:43 AM Pager: [phone]  Please contact the on call pager after 5 pm and on weekends at [phone].

## 2021-08-22 NOTE — Progress Notes (Signed)
Pt recently had a bm. Still unable to collect urine because she continues to urine and have a bm at the same time preventing a clean urine sample. Pt still refusing to have in/out as ordered by provider to collect a urine sample. Pt continues to request that we wait for her to urinate on her own.

## 2021-08-23 LAB — BASIC METABOLIC PANEL
Anion gap: 15 (ref 5–15)
BUN: 35 mg/dL — ABNORMAL HIGH (ref 6–20)
CO2: 20 mmol/L — ABNORMAL LOW (ref 22–32)
Calcium: 8.8 mg/dL — ABNORMAL LOW (ref 8.9–10.3)
Chloride: 102 mmol/L (ref 98–111)
Creatinine, Ser: 5.69 mg/dL — ABNORMAL HIGH (ref 0.44–1.00)
GFR, Estimated: 8 mL/min — ABNORMAL LOW (ref >60)
Glucose, Bld: 201 mg/dL — ABNORMAL HIGH (ref 70–99)
Potassium: 3.7 mmol/L (ref 3.5–5.1)
Sodium: 137 mmol/L (ref 135–145)

## 2021-08-23 LAB — GLUCOSE, CAPILLARY
Glucose-Capillary: 138 mg/dL — ABNORMAL HIGH (ref 70–99)
Glucose-Capillary: 196 mg/dL — ABNORMAL HIGH (ref 70–99)
Glucose-Capillary: 333 mg/dL — ABNORMAL HIGH (ref 70–99)
Glucose-Capillary: 274 mg/dL — ABNORMAL HIGH (ref 70–99)

## 2021-08-23 LAB — CBC
HCT: 24.2 % — ABNORMAL LOW (ref 36.0–46.0)
Hemoglobin: 7.5 g/dL — ABNORMAL LOW (ref 12.0–15.0)
MCH: 30.6 pg (ref 26.0–34.0)
MCHC: 31 g/dL (ref 30.0–36.0)
MCV: 98.8 fL (ref 80.0–100.0)
Platelets: 294 10*3/uL (ref 150–400)
RBC: 2.45 MIL/uL — ABNORMAL LOW (ref 3.87–5.11)
RDW: 12.9 % (ref 11.5–15.5)
WBC: 14.4 10*3/uL — ABNORMAL HIGH (ref 4.0–10.5)
nRBC: 0 % (ref 0.0–0.2)

## 2021-08-23 MED ORDER — LACTATED RINGERS IV SOLN: INTRAVENOUS | Status: AC

## 2021-08-23 MED ORDER — SENNOSIDES-DOCUSATE SODIUM 8.6-50 MG PO TABS
1.0000 | ORAL_TABLET | Freq: Two times a day (BID) | ORAL | Status: DC
  Administered 2021-08-23 – 2021-08-28 (×9): 1 via ORAL
  Filled 2021-08-23 (×10): qty 1

## 2021-08-23 MED ORDER — INSULIN GLARGINE-YFGN 100 UNIT/ML ~~LOC~~ SOLN
35.0000 [IU] | Freq: Every day | SUBCUTANEOUS | Status: DC
  Administered 2021-08-23 – 2021-08-27 (×5): 35 [IU] via SUBCUTANEOUS
  Filled 2021-08-23 (×7): qty 0.35

## 2021-08-23 MED ORDER — LACTATED RINGERS IV SOLN: INTRAVENOUS | Status: DC

## 2021-08-23 MED ORDER — CHLORHEXIDINE GLUCONATE CLOTH 2 % EX PADS
6.0000 | MEDICATED_PAD | Freq: Every day | CUTANEOUS | Status: DC
  Administered 2021-08-24 – 2021-08-28 (×3): 6 via TOPICAL

## 2021-08-23 NOTE — Plan of Care (Signed)
  Problem: Education: Goal: Ability to describe self-care measures that may prevent or decrease complications (Diabetes Survival Skills Education) will improve Outcome: Progressing   Problem: Education: Goal: Knowledge of General Education information will improve Description: Including pain rating scale, medication(s)/side effects and non-pharmacologic comfort measures Outcome: Progressing   Problem: Health Behavior/Discharge Planning: Goal: Ability to manage health-related needs will improve Outcome: Progressing   

## 2021-08-23 NOTE — Progress Notes (Signed)
Paged by pt for pain medication. Explained pt  pain medication schedule, next dose due @ 2200, no PRN's. Pt said she's not waiting for 2 hrs and requested I page MD. Pt eating dinner and watching tv, NAD noted. Will page MD per pt request.

## 2021-08-23 NOTE — Progress Notes (Signed)
Placed foley. No urine return because patient had just used BSC and she stated that she did urinate but it was mixed with stool. Will continue to monitor for urine to send to lab.

## 2021-08-23 NOTE — Progress Notes (Addendum)
~  1925: Pt eating dinner tray and requested nausea med. AM RN administered zofran. Upon entering room @ 2100, patient was sleeping in chair. Upon awakening, pt requested pain medication. Discussed pain medication orders with patient and made her aware of her scheduled 4 mg dilaudid q8hrs and prn '4mg'$  po dilaudid. If after oral pain medication ineffective, IV dilaudid was available. Pt administered prn po dilaudid. Informed pt I would return at midnight with scheduled dilaudid. Pt eating brownie and drinking coke with po medications. Upon reassessments, pt sleeping in chair. Entered room at midnight and pt sleeping soundly in chair. Awakened pt who immediately requested iv dilaudid. Informed pt I had her oral dilaudid and she was not nauseous and actively eating/drinking she would have to try PO medication first. Pt states 'that oral stuff doesn't work, only the IV works.' Pt administered PO dilaudid. Pt drank coke at this time with no issues. No acute distress noted. Will continue to monitor.  0020: Pt has not voided since 1816. Pt denied needing to void, refused bladder scan. Pt made aware multiple times a urine sample was needed. Pt verbalized understanding.   1658: Pt c/o nausea. States she does not feel need to void. Refused bladder scan/cath.

## 2021-08-23 NOTE — Progress Notes (Signed)
Mobility Specialist Progress Note:   08/23/21 0925  Mobility  Activity  (chair level exercises)  Range of Motion/Exercises Active;Right leg;Left leg  Level of Assistance Independent  Assistive Device None  Activity Response Tolerated fair  $Mobility charge 1 Mobility   Pt received up in chair. Educated pt on chair level exercises, pt able to demo a few. C/o BLE swelling and abdominal pain from urinary retention. Left with all needs met.   Nelta Numbers Acute Rehab Secure Chat or Office Phone: [phone]

## 2021-08-23 NOTE — Progress Notes (Signed)
Patient's bed was not working properly so we switched it out for another bed.

## 2021-08-23 NOTE — Progress Notes (Signed)
HD#8 Subjective:  Overnight Events: See RN note. Refused bladder scan and placement of foley.  Patient has some mild pain on initial exam. Paged after initially rounding and patient upset IV dilaudid stopped. I discussed bedside with patient and RN. Patient is having abdominal pain and radiates to her back. She is on PO dilaudid at home and we will continue PO dilaudid. She is willing to have foley catheter placed which will help with diagnose worsening kidney disease and acute pain.   Objective:  Vital signs in last 24 hours: Vitals:   08/22/21 1647 08/22/21 1801 08/22/21 2051 08/23/21 0521  BP: 109/60  122/70 107/68  Pulse: 92  92 (!) 106  Resp: '20  18 17  '$ Temp: 97.6 F (36.4 C)  98 F (36.7 C) 98.1 F (36.7 C)  TempSrc: Axillary  Oral Oral  SpO2: 100%  100% 100%  Weight:  129.7 kg    Height:       Supplemental O2: Room Air SpO2: 100 %   Physical Exam:  Physical Exam Constitutional:      General: She is not in acute distress.    Appearance: She is obese.  Eyes:     General: No scleral icterus. Cardiovascular:     Rate and Rhythm: Normal rate and regular rhythm.  Abdominal:     General: Bowel sounds are normal. There is no distension.     Palpations: Abdomen is soft. There is no mass.     Tenderness: There is abdominal tenderness. There is right CVA tenderness and left CVA tenderness. There is no guarding or rebound.     Hernia: No hernia is present.  Musculoskeletal:     Right lower leg: Edema (1+) present.     Left lower leg: Edema (1+) present.     Filed Weights   08/15/21 1852 08/22/21 1801  Weight: 113.4 kg 129.7 kg     Intake/Output Summary (Last 24 hours) at 08/23/2021 0935 Last data filed at 08/23/2021 0600 Gross per 24 hour  Intake 860 ml  Output 0 ml  Net 860 ml    Net IO Since Admission: 8,638.44 mL [08/23/21 0935]  Pertinent Labs:    Latest Ref Rng & Units 08/23/2021    1:08 AM 08/22/2021    4:03 AM 08/21/2021    2:45 AM  CBC  WBC 4.0  - 10.5 K/uL 14.4  14.1  12.9   Hemoglobin 12.0 - 15.0 g/dL 7.5  8.2  8.3   Hematocrit 36.0 - 46.0 % 24.2  25.5  26.7   Platelets 150 - 400 K/uL 294  331  309        Latest Ref Rng & Units 08/23/2021    1:08 AM 08/22/2021    4:03 AM 08/21/2021    2:45 AM  CMP  Glucose 70 - 99 mg/dL 201  281  215   BUN 6 - 20 mg/dL 35  29  23   Creatinine 0.44 - 1.00 mg/dL 5.69  5.09  4.25   Sodium 135 - 145 mmol/L 137  133  137   Potassium 3.5 - 5.1 mmol/L 3.7  3.8  4.0   Chloride 98 - 111 mmol/L 102  100  105   CO2 22 - 32 mmol/L '20  18  19   '$ Calcium 8.9 - 10.3 mg/dL 8.8  8.4  8.3     Imaging: DG CHEST PORT 1 VIEW  Result Date: 08/22/2021 CLINICAL DATA:  Acute kidney injury EXAM: PORTABLE CHEST 1 VIEW  COMPARISON:  08/15/2021 FINDINGS: Shallow inspiration. Heart size and pulmonary vascularity are normal. Lungs are clear. No pleural effusions. No pneumothorax. Mediastinal contours appear intact. Degenerative changes in the spine and shoulders. IMPRESSION: No active disease. Electronically Signed   By: Lucienne Capers M.D.   On: 08/22/2021 22:48    Assessment/Plan:   Principal Problem:   Acute pyelonephritis Active Problems:   Type 2 diabetes mellitus with hyperglycemia, with long-term current use of insulin (HCC)   Diabetic gastroparesis (HCC)   Anxiety   Abdominal pain   Chronic pain   Acute on chronic kidney failure ([place])   AKI (acute kidney injury) [facility])   Patient Summary: [name] is a 56 yo F with a PMH of T2DM c/b gastroparesis, HTN, HLD, paroxysmal atrial fibrillation on chronic anticoagulation with eliquis, CKD stage 4, class 3 obesity, chronic pain, h/o spinal stenosis mostly wheel chair bound, HFpEF presenting with abdominal pain and admitted for AKI.  #AKI on CKD4 - Baseline Cr 1.84-2.54 in past 4 months.  - Creatinine trending up in last 4 days, no urine output recorded on stay in hospital. Patient reports urge to urinate, but not urine output overnight. Refused to  have foley place by RN last night, but willing to have it placed today.  -Nephrology consulted, greatly appreciate assistance and recommendations -No signs of obstruction on renal ultrasound. Nephrology recommends challenge with IV fluids, will start LR 75 cc/hr for 12 hours. Agree edema in legs could be from venous insufficieny and patient could be intravascularly volume down.  -Trend I/O's and Kidney function with IV fluids.  - Continue sodium bicarbonate 650 mg BID  #Acute Pyelonephritis -Switch from Ancef to Ceftriaxone as there is rare possibility that ancef may be nephrotoxic. Patient on day 5 of 7 planned for treatment.  - Continues to have abdominal pain which I expect to be improving if from pyelonephritis. Expect this is chronic abdominal pain flair.   #Acute on Chronic Abdominal Pain Patient has chronic abdominal pain. She has been imaged with CT abdomen/pelvis multiple times and no clear etiology has been found. Chronic pain treated with PO dilaudid.  - 4 mg PO dilaudid q8hrs  -Continue outpatient GI care in [place] [facility] -Simethicone q6 hour prn for flatulence - Continue Protonix  - Zofran for nausea and Compazine for refractory nausea -Advance diet as tolerated   #HTN - normotensive, holding home antihypertensives today in setting of AKI on CKD4 - Hold hydralazine 50 mg 3 times daily, hold home Imdur 60 mg daily, hold home metoprolol XL 50 mg daily   #T2DM -Continue Semglee 32 u -SSI & CBG with 15 unit meal coverage qac and qhs   #PAF -Continue home Eliquis   #HFpEF LVEF 50-55% - hold  torsemide 40 mg qd, aldactone 12.5 mg qd in setting of AKI   #IDA #Anemia of Chronic Disease - Hgb Stable 7 to 8     Diet: Carb-Modified IVF: 75cchr VTE: Eliquis Code: Full PT/OT recs: None, none. TOC recs: none   Dispo: Anticipated discharge to Home in 2 days pending improving kidney function.   Madalyn Rob, MD 08/23/2021, 9:35 AM Pager:  [phone]  Please contact the on call pager after 5 pm and on weekends at [phone].

## 2021-08-23 NOTE — Progress Notes (Signed)
Patient requesting pain medication and nausea medication. She showed me a small amount of emesis in a bag. I brought compazine into room because zofran is not due until 0939. I also brought her scheduled PO dilaudid into the room. She requested that I bring her IV dilaudid. I explained that I was going to give her compazine and then give her her PO scheduled dilaudid about 15-20 minutes later. She had her breakfast tray in room and was being to eat and she was on the phone with nutrition ordering additional food items. She stated that "she wasn't going to play this game today and that she wanted IV dilaudid immediately". She also refused to allow me to place foley until she got her IV pain medication.  She demanded to see the doctor. I will page him.

## 2021-08-23 NOTE — Progress Notes (Signed)
[facility] Progress Note  Subjective: seen in room, creat up 5.6 today. No UOP recorded, 780 cc in by mouth. BP's remain soft, last 107/ 68.   Vitals:   08/22/21 1647 08/22/21 1801 08/22/21 2051 08/23/21 0521  BP: 109/60  122/70 107/68  Pulse: 92  92 (!) 106  Resp: _0 Temp: 97.6 F (36.4 C)  98 F (36.7 C) 98.1 F (36.7 C)  TempSrc: Axillary  Oral Oral  SpO2: 100%  100% 100%  Weight:  129.7 kg    Height:        Exam: Gen alert, obese, pleasant No jvd or bruits Chest clear bilat to bases, no rales/ wheezing RRR no MRG Abd soft ntnd no mass or ascites +bsno woun Ext 1-2+ pretib edema bilat, no hip or other edema Neuro is alert, Ox 3 , nf, no asterixis       Home meds include - atorvastatin, eliquis, famotidine, hydralazine 50 tid, isosorbide mononitrate 60, lantus insulin, metoclopramide, metoprolol xl 50, insulin novolog, pantoprazole, spironolactone 12.5, torsemide 40 qd, prns         Date                                  Creat               eGFR      2011-16                              0.7- 1.0      2017                                   0.9- 1.6      2018                                   0.8- 1.7      2019                                   1.0- 1.75      2020                                   1.36- 1.83      2021                                   1.11- 2.51      Jan-jun 2022                      2.44- 4.38      July -dec 2022                   1.20- 2.95      Feb- Aug 02, 2021             1.84- 2.54        21- 32 ml/min,        Total I/O's = 8 L in and nothing recorded out = +  8L    Admit BP's high 170/ 80, last 3 days BP's lower 110- 140/ 58- 70. No BP's < 100.    Has been getting hydralazine and po metoprolol xl for last 7 days   IV abx = IV rocephin 6/10- 6/11   IV ancef 613- 6/16   CT abd wo 6/10 - Adrenals/Urinary Tract: Adrenal glands are unremarkable. Kidneys are normal, without renal calculi, focal lesion, or hydronephrosis. Bladder  is unremarkable.    CT abd wo 6/13 - Adrenals/Urinary Tract: There is no adrenal mass no focal abnormality in the unenhanced renal cortex. Perinephric stranding does appear slightly increased today which could be due to inflammation or changes in fluid balance. There is no evidence of urinary stones or obstruction. There is no bladder thickening.    CXR 6/10 - IMPRESSION: No acute cardiopulmonary process.    Renal US 6/11, 6/16 > 11-12 cm kidneys w/o hydro, normal echo    UA 6/10 - turbid, amber, large LE, 100 prot, rare bact, 0-5 rbc, >50 wbc    UCx + EColi, blood cx's negative x 2    Echo last done sept 2022 - LVEF 50-55%     CXR 6/71 - completely negative for vasc congestion or edema   Assessment/ Plan: AKI on CKD 4 - b/l creat 1.84- 2.54, from feb- may 2023, 21- 32 ml/min.  Creat 4.1 on admission in setting of UTI and suspected pyelonephritis. Creat improved to 1.7 several days later. After this the creatinine climbed again up to 5.1 yesterday. No UOP is recorded during entire stay. Not voiding much per the patient. UA from 6/10 looked infected. Renal US x 2 w/o obstruction, 2nd was on 6/16. On exam, she has clear lungs and mild lower leg edema bilat, no other edema. No uremic signs or symptoms. Foley cath refused but is agreeable to placement now. Repeat UA, urine lytes ordered. No indication for HD at this time. Suspect intravasc hypovolemia and relative hypotension causing hypoperfusion. Have dc'd all BP lowering meds and will start IVF's 75 cc/hr. Let BP's come up, keep sbp > 120 while having AKI issue. Creat rate of rise is lower today w/ creat 5.6. No indication for RRT at this time. Will follow.   Volume - per patient her "normal weight" is between 250 and 325 lbs. Weighed in a 280 lbs yesterday. As above.  UTI - ecoli on urine cx, blood cxs negative. Still receiving rocephin.  HTN - bp's relatively softening over the last 2-3 days. Have dc'd metoprolol and hydralazine altogether and dc'd  imdur as well.   Chronic syst CHF - last EF 50-55%.           Rob Alicen Donalson 08/23/2021, 5:51 AM   Recent Labs  Lab 08/18/21 0029 08/19/21 0604 08/22/21 0403 08/23/21 0108  HGB 7.8*   < > 8.2* 7.5*  ALBUMIN 3.0*  --   --   --   CALCIUM 9.4   < > 8.4* 8.8*  CREATININE 2.17*   < > 5.09* 5.69*  K 3.9   < > 3.8 3.7   < > = values in this interval not displayed.   Inpatient medications:  apixaban  5 mg Oral BID   DULoxetine  40 mg Oral Daily   hydrALAZINE  25 mg Oral TID   HYDROmorphone  4 mg Oral Q8H   insulin aspart  0-15 Units Subcutaneous TID WC   insulin aspart  15 Units Subcutaneous TID WC   insulin glargine-yfgn  32  Units Subcutaneous QHS   isosorbide mononitrate  60 mg Oral Daily   lactulose  300 mL Rectal Once   pantoprazole  40 mg Oral Daily   sodium bicarbonate  650 mg Oral BID    cefTRIAXone (ROCEPHIN)  IV 200 mL/hr at 08/23/21 0515   albuterol, HYDROmorphone (DILAUDID) injection, HYDROmorphone, ondansetron (ZOFRAN) IV, prochlorperazine, senna-docusate, simethicone

## 2021-08-23 NOTE — Progress Notes (Signed)
Patient's IV kept beeping so I put in an IV consult order and turned fluids off. The IV team came to put in a new IV and the patient was eating so she told them to come back later.

## 2021-08-23 NOTE — Progress Notes (Signed)
Mobility Specialist Progress Note:   08/23/21 1530  Mobility  Activity Transferred from bed to chair  Level of Assistance Standby assist, set-up cues, supervision of patient - no hands on  Assistive Device None  Distance Ambulated (ft) 2 ft  Activity Response Tolerated well  $Mobility charge 1 Mobility   Pt needing to transfer to chair to get a new bed in room. Required not physical assist throughout. Pt left in chair with all needs met.   Nelta Numbers Acute Rehab Secure Chat or Office Phone: [phone]

## 2021-08-23 NOTE — Plan of Care (Signed)
  Problem: Coping: Goal: Ability to adjust to condition or change in health will improve Outcome: Progressing   Problem: Fluid Volume: Goal: Ability to maintain a balanced intake and output will improve Outcome: Progressing   Problem: Metabolic: Goal: Ability to maintain appropriate glucose levels will improve Outcome: Progressing   Problem: Nutritional: Goal: Maintenance of adequate nutrition will improve Outcome: Progressing Goal: Progress toward achieving an optimal weight will improve Outcome: Progressing   Problem: Skin Integrity: Goal: Risk for impaired skin integrity will decrease Outcome: Progressing   Problem: Health Behavior/Discharge Planning: Goal: Ability to manage health-related needs will improve Outcome: Progressing   Problem: Education: Goal: Knowledge of General Education information will improve Description: Including pain rating scale, medication(s)/side effects and non-pharmacologic comfort measures Outcome: Progressing

## 2021-08-24 DIAGNOSIS — G8929 Other chronic pain: Secondary | ICD-10-CM

## 2021-08-24 DIAGNOSIS — R109 Unspecified abdominal pain: Secondary | ICD-10-CM | POA: Diagnosis not present

## 2021-08-24 DIAGNOSIS — N10 Acute tubulo-interstitial nephritis: Secondary | ICD-10-CM | POA: Diagnosis not present

## 2021-08-24 DIAGNOSIS — N179 Acute kidney failure, unspecified: Secondary | ICD-10-CM | POA: Diagnosis not present

## 2021-08-24 LAB — BASIC METABOLIC PANEL
Anion gap: 12 (ref 5–15)
BUN: 38 mg/dL — ABNORMAL HIGH (ref 6–20)
CO2: 19 mmol/L — ABNORMAL LOW (ref 22–32)
Calcium: 9 mg/dL (ref 8.9–10.3)
Chloride: 105 mmol/L (ref 98–111)
Creatinine, Ser: 6.18 mg/dL — ABNORMAL HIGH (ref 0.44–1.00)
GFR, Estimated: 7 mL/min — ABNORMAL LOW (ref >60)
Glucose, Bld: 181 mg/dL — ABNORMAL HIGH (ref 70–99)
Potassium: 3.5 mmol/L (ref 3.5–5.1)
Sodium: 136 mmol/L (ref 135–145)

## 2021-08-24 LAB — HEPATIC FUNCTION PANEL
ALT: <5 U/L (ref 0–44)
AST: 9 U/L — ABNORMAL LOW (ref 15–41)
Albumin: 2.8 g/dL — ABNORMAL LOW (ref 3.5–5.0)
Alkaline Phosphatase: 91 U/L (ref 38–126)
Bilirubin, Direct: <0.1 mg/dL (ref 0.0–0.2)
Total Bilirubin: 0.4 mg/dL (ref 0.3–1.2)
Total Protein: 6.8 g/dL (ref 6.5–8.1)

## 2021-08-24 LAB — GLUCOSE, CAPILLARY
Glucose-Capillary: 215 mg/dL — ABNORMAL HIGH (ref 70–99)
Glucose-Capillary: 229 mg/dL — ABNORMAL HIGH (ref 70–99)
Glucose-Capillary: 37 mg/dL — CL (ref 70–99)
Glucose-Capillary: 84 mg/dL (ref 70–99)
Glucose-Capillary: 187 mg/dL — ABNORMAL HIGH (ref 70–99)

## 2021-08-24 LAB — URINALYSIS, ROUTINE W REFLEX MICROSCOPIC
Bilirubin Urine: NEGATIVE
Glucose, UA: 50 mg/dL — AB
Ketones, ur: NEGATIVE mg/dL
Nitrite: NEGATIVE
Protein, ur: 100 mg/dL — AB
RBC / HPF: >50 RBC/hpf — ABNORMAL HIGH (ref 0–5)
Specific Gravity, Urine: 1.009 (ref 1.005–1.030)
Waxy Casts, UA: 1
pH: 5 (ref 5.0–8.0)

## 2021-08-24 LAB — CBC WITH DIFFERENTIAL/PLATELET
Abs Immature Granulocytes: 0.07 10*3/uL (ref 0.00–0.07)
Basophils Absolute: 0.1 10*3/uL (ref 0.0–0.1)
Basophils Relative: 1 %
Eosinophils Absolute: 0.3 10*3/uL (ref 0.0–0.5)
Eosinophils Relative: 2 %
HCT: 24.5 % — ABNORMAL LOW (ref 36.0–46.0)
Hemoglobin: 7.9 g/dL — ABNORMAL LOW (ref 12.0–15.0)
Immature Granulocytes: 1 %
Lymphocytes Relative: 15 %
Lymphs Abs: 2.1 10*3/uL (ref 0.7–4.0)
MCH: 30.7 pg (ref 26.0–34.0)
MCHC: 32.2 g/dL (ref 30.0–36.0)
MCV: 95.3 fL (ref 80.0–100.0)
Monocytes Absolute: 0.7 10*3/uL (ref 0.1–1.0)
Monocytes Relative: 5 %
Neutro Abs: 10.3 10*3/uL — ABNORMAL HIGH (ref 1.7–7.7)
Neutrophils Relative %: 76 %
Platelets: 327 10*3/uL (ref 150–400)
RBC: 2.57 MIL/uL — ABNORMAL LOW (ref 3.87–5.11)
RDW: 13 % (ref 11.5–15.5)
WBC: 13.5 10*3/uL — ABNORMAL HIGH (ref 4.0–10.5)
nRBC: 0 % (ref 0.0–0.2)

## 2021-08-24 LAB — SODIUM, URINE, RANDOM: Sodium, Ur: 88 mmol/L

## 2021-08-24 LAB — CREATININE, URINE, RANDOM: Creatinine, Urine: 81.12 mg/dL

## 2021-08-24 LAB — LIPASE, BLOOD: Lipase: 41 U/L (ref 11–51)

## 2021-08-24 MED ORDER — LIDOCAINE 5 % EX PTCH
1.0000 | MEDICATED_PATCH | Freq: Every day | CUTANEOUS | Status: DC
Start: 2021-08-24 — End: 2021-08-28
  Administered 2021-08-24 – 2021-08-28 (×4): 1 via TRANSDERMAL
  Filled 2021-08-24 (×5): qty 1

## 2021-08-24 MED ORDER — INSULIN ASPART 100 UNIT/ML IJ SOLN: 18.0000 [IU] | Freq: Three times a day (TID) | INTRAMUSCULAR | Status: DC

## 2021-08-24 MED ORDER — OXYBUTYNIN CHLORIDE 5 MG PO TABS
5.0000 mg | ORAL_TABLET | Freq: Three times a day (TID) | ORAL | Status: DC
  Administered 2021-08-24 – 2021-08-25 (×4): 5 mg via ORAL
  Filled 2021-08-24 (×6): qty 1

## 2021-08-24 MED ORDER — METHOCARBAMOL 750 MG PO TABS
750.0000 mg | ORAL_TABLET | Freq: Three times a day (TID) | ORAL | Status: DC
  Administered 2021-08-24 – 2021-08-28 (×13): 750 mg via ORAL
  Filled 2021-08-24 (×13): qty 1

## 2021-08-24 NOTE — Progress Notes (Signed)
Patient continues to yell out. Pain medication given as scheduled. Mother at bedside, very supportive of staff efforts to care for patient. Urine sample collected via foley catheter. Peri care and foley care given after incontinent BM episode. Patient back and forth between recliner and bed. Call bell in reach.

## 2021-08-24 NOTE — Progress Notes (Signed)
Mobility Specialist Progress Note:   08/24/21 0915  Mobility  Activity  (chair level exercises/ROM)  Range of Motion/Exercises Active;All extremities  Level of Assistance Independent  Activity Response Tolerated poorly  $Mobility charge 1 Mobility   Pt received screaming crying in pain saying "someone help me". RN aware, no pain medicine available at this time. Attempted to redirect and comfort pt, somewhat successful. Pt c/o intense abdominal pain, leaning over on table while sitting in chair. Pt declined sitting up straight or lying flat on bed to possible ease pain, stated she couldn't move. Was able to do ROM stretches with BLE however very limited. Pt left in chair.  Nelta Numbers Acute Rehab Secure Chat or Office Phone: [phone]

## 2021-08-24 NOTE — Progress Notes (Signed)
Since this nurse's arrival this morning, patient has been intermittently screaming and yelling for help. When staff enters room, patient complains of pain. Patient made aware multiple times by this nurse that the MD was made aware by previous nurse and that pain medication is currently due approximately 1400. Patient makes no response or acknowledgment of this information. Remains in recliner at this time. Call bell in reach.

## 2021-08-24 NOTE — Progress Notes (Signed)
Hypoglycemic Event  CBG: 37  Treatment: 8 oz juice/soda  Symptoms: Shaky and dizzy  Follow-up CBG: Time: 1654 CBG Result: 84  Possible Reasons for Event: Inadequate meal intake and Unknown  Comments/MD notified:Dr. Cain Sieve notified     Mellody Life

## 2021-08-24 NOTE — Progress Notes (Signed)
[facility] Progress Note    Assessment/ Plan:   AKI on CKD 4 - b/l creat 1.84- 2.54, from feb- may 2023, 21- 32 ml/min.  Creat 4.1 on admission in setting of UTI and suspected pyelonephritis. Creat improved to 1.7 several days later. After this the creatinine climbed again up to 5.1 yesterday. No UOP is recorded during entire stay. Not voiding much per the patient. UA from 6/10 looked infected. Renal US x 2 w/o obstruction, 2nd was on 6/16. On exam, she has clear lungs and mild lower leg edema bilat, no other edema. No uremic signs or symptoms.  - has a Foley now - UOP yesterday 450 mL and already today 300 mL - no indication for RRT at present - on Na bicarb - s/p IVFs, would not give any more at present  2.  Volume - per patient her "normal weight" is between 250 and 325 lbs. Weighed in a 280 lbs  3. UTI - ecoli on urine cx, blood cxs negative. Still receiving rocephin. 4.  HTN - bp's relatively softening over the last 2-3 days. Have dc'd metoprolol and hydralazine altogether and dc'd imdur as well.   5.  Chronic syst CHF - last EF 50-55%.   6.  Afib: on Eliquis  Subjective:    Seen in room.  Screaming and yelling in pain intermittently.  Rate of rise of creatinine appears to be plateuing.  UOP increasing- has about 300 mL in Foley this AM   Objective:   BP (!) 138/98 (BP Location: Left Arm)   Pulse (!) 110   Temp 98 F (36.7 C) (Oral)   Resp 16   Ht '5\' 6"'$  (1.676 m)   Wt 129.7 kg   LMP 10/10/2012   SpO2 100%   BMI 46.15 kg/m   Intake/Output Summary (Last 24 hours) at 08/24/2021 1231 Last data filed at 08/24/2021 8338 Gross per 24 hour  Intake 1294.44 ml  Output 450 ml  Net 844.44 ml   Weight change:   Physical Exam: [id number] intermittently CVS: RRR Resp: clear Ext: 1= LE edema bilaterally  Imaging: DG CHEST PORT 1 VIEW  Result Date: 08/22/2021 CLINICAL DATA:  Acute kidney injury EXAM: PORTABLE CHEST 1 VIEW COMPARISON:  08/15/2021 FINDINGS:  Shallow inspiration. Heart size and pulmonary vascularity are normal. Lungs are clear. No pleural effusions. No pneumothorax. Mediastinal contours appear intact. Degenerative changes in the spine and shoulders. IMPRESSION: No active disease. Electronically Signed   By: Lucienne Capers M.D.   On: 08/22/2021 22:48    Labs: BMET Recent Labs  Lab 08/18/21 0029 08/19/21 0604 08/20/21 0011 08/21/21 0245 08/22/21 0403 08/23/21 0108 08/24/21 0426  NA 137 138 134* 137 133* 137 136  K 3.9 3.7 3.2* 4.0 3.8 3.7 3.5  CL 108 104 98 105 100 102 105  CO2 21* 24 23 19* 18* 20* 19*  GLUCOSE 238* 164* 203* 215* 281* 201* 181*  BUN 31* 20 21* 23* 29* 35* 38*  CREATININE 2.17* 1.72* 3.08* 4.25* 5.09* 5.69* 6.18*  CALCIUM 9.4 9.1 8.6* 8.3* 8.4* 8.8* 9.0   CBC Recent Labs  Lab 08/21/21 0245 08/22/21 0403 08/23/21 0108 08/24/21 0426  WBC 12.9* 14.1* 14.4* 13.5*  NEUTROABS  --   --   --  10.3*  HGB 8.3* 8.2* 7.5* 7.9*  HCT 26.7* 25.5* 24.2* 24.5*  MCV 97.1 96.2 98.8 95.3  PLT 309 331 294 327    Medications:     apixaban  5 mg Oral BID  Chlorhexidine Gluconate Cloth  6 each Topical Daily   DULoxetine  40 mg Oral Daily   HYDROmorphone  4 mg Oral Q8H   insulin aspart  0-15 Units Subcutaneous TID WC   insulin aspart  15 Units Subcutaneous TID WC   insulin glargine-yfgn  35 Units Subcutaneous QHS   lactulose  300 mL Rectal Once   lidocaine  1 patch Transdermal Daily   methocarbamol  750 mg Oral Q8H   oxybutynin  5 mg Oral TID   pantoprazole  40 mg Oral Daily   senna-docusate  1 tablet Oral BID   sodium bicarbonate  650 mg Oral BID      Madelon Lips MD 08/24/2021, 12:31 PM

## 2021-08-24 NOTE — Progress Notes (Addendum)
HD#8 Subjective:  Overnight Events: NAEON. Mildly Tachycardic and hypertensive.  Patient seen and assessed at bedside. Reports being in acute stabbing pain since 6 AM. Patient is tearful and frustrated by the pain. Reports abdominal pain is band-like in lower abdomen that radiates to back. Reports she may have begun having this abdominal pain when she had Foley inserted.  Does not reported correlating with food.  Last bowel movement was yesterday.  Offered one-time additional dose of p.o. Dilaudid but patient did not feel like this would help so refused at this time.  Objective:  Vital signs in last 24 hours: Vitals:   08/23/21 0720 08/23/21 1520 08/23/21 2038 08/24/21 0603  BP: 127/81 122/61 (!) 157/74 (!) 158/105  Pulse: (!) 101 (!) 106 (!) 110 (!) 115  Resp: '19  18 18  '$ Temp: 97.6 F (36.4 C) 97.7 F (36.5 C) 97.7 F (36.5 C) 98.4 F (36.9 C)  TempSrc: Oral Oral Oral Oral  SpO2: 100% 100% 100% 100%  Weight:      Height:       Supplemental O2: Room Air SpO2: 100 %   Physical Exam:  Physical Exam Constitutional:      General: She is not in acute distress.    Appearance: She is obese.  Eyes:     General: No scleral icterus. Cardiovascular:     Rate and Rhythm: Normal rate and regular rhythm.  Abdominal:     General: Bowel sounds are normal. There is no distension.     Palpations: Abdomen is soft. There is no mass.     Tenderness: There is abdominal tenderness. There is no guarding or rebound.     Hernia: No hernia is present.     Comments: Significant lower back pain that is tender to palpation that radiates to back and reproducible with palpation of back, more acutely right lower back  Musculoskeletal:     Right lower leg: Edema (1+) present.     Left lower leg: Edema (1+) present.  Psychiatric:     CommentsMarton [name], anxious     Filed Weights   08/15/21 1852 08/22/21 1801  Weight: 113.4 kg 129.7 kg     Intake/Output Summary (Last 24 hours) at 08/24/2021  0711 Last data filed at 08/24/2021 3300 Gross per 24 hour  Intake 1294.44 ml  Output 450 ml  Net 844.44 ml    Net IO Since Admission: 9,482.88 mL [08/24/21 0711]  Pertinent Labs:    Latest Ref Rng & Units 08/24/2021    4:26 AM 08/23/2021    1:08 AM 08/22/2021    4:03 AM  CBC  WBC 4.0 - 10.5 K/uL 13.5  14.4  14.1   Hemoglobin 12.0 - 15.0 g/dL 7.9  7.5  8.2   Hematocrit 36.0 - 46.0 % 24.5  24.2  25.5   Platelets 150 - 400 K/uL 327  294  331        Latest Ref Rng & Units 08/24/2021    4:26 AM 08/23/2021    1:08 AM 08/22/2021    4:03 AM  CMP  Glucose 70 - 99 mg/dL 181  201  281   BUN 6 - 20 mg/dL 38  35  29   Creatinine 0.44 - 1.00 mg/dL 6.18  5.69  5.09   Sodium 135 - 145 mmol/L 136  137  133   Potassium 3.5 - 5.1 mmol/L 3.5  3.7  3.8   Chloride 98 - 111 mmol/L 105  102  100  CO2 22 - 32 mmol/L '19  20  18   '$ Calcium 8.9 - 10.3 mg/dL 9.0  8.8  8.4     Imaging: No results found.  Assessment/Plan:   Principal Problem:   Acute pyelonephritis Active Problems:   Type 2 diabetes mellitus with hyperglycemia, with long-term current use of insulin (HCC)   Diabetic gastroparesis (HCC)   Anxiety   Abdominal pain   Chronic pain   Acute on chronic kidney failure ([place])   AKI (acute kidney injury) [facility])   Patient Summary: [name] is a 56 yo F with a PMH of T2DM c/b gastroparesis, HTN, HLD, paroxysmal atrial fibrillation on chronic anticoagulation with eliquis, CKD stage 4, class 3 obesity, chronic pain, h/o spinal stenosis mostly wheel chair bound, HFpEF presenting with abdominal pain and admitted for suspected pyelonephritis and AKI.  #AKI on CKD4 UOP .45 L. Cr 6.18 today. Kidney function does not appear to respond to mIVF. Unclear if dehydration vs cardiorenal vs ATN. - Baseline Cr 1.84-2.54 in past 4 months.  -No signs of obstruction on renal ultrasound.  - Foley in place - Nephrology consulted, greatly appreciate assistance and recommendations -Trend I/O's  and Kidney function with IV fluids -Repeat UA collected and in process  - Continue sodium bicarbonate 650 mg BID  #Acute on Chronic Abdominal Pain Chronic pain treated with PO dilaudid. New abdominal pain may be due to bladder spasms due to foley vs muscular spasms. Attempted to use multiple other modalities to manage acute pain such as NSAID, gabapentin, lidocaine patch to address pain but patient has allergies or refused these options. Do not want to persistently provide IV pain medications without addressing etiology of acute pain.  - 4 mg PO dilaudid q8hrs for chronic pain -Continue outpatient GI care in [place] outpatient pain clinic -Simethicone q6 hour prn for flatulence - Continue Protonix  - Zofran for nausea and Compazine for refractory nausea - Start robaxin 750 q8h - Start oxybutnin 5 mg 3 times daily - Start lidocaine patch every 24 hour  #Acute Pyelonephritis -Continue Ceftriaxone (total abx: day 6 of 7).   #HTN - normotensive, holding home antihypertensives today in setting of AKI on CKD4 - Hold hydralazine 50 mg 3 times daily, hold home Imdur 60 mg daily, hold home metoprolol XL 50 mg daily   #T2DM -Continue Semglee 32 u -SSI & CBG with 15 unit meal coverage qac and qhs   #PAF -Continue home Eliquis   #HFpEF LVEF 50-55% - hold  torsemide 40 mg qd, aldactone 12.5 mg qd in setting of AKI   #Anemia of Chronic Disease - Hgb Stable 7 to 8   Diet: Carb-Modified IVF: none VTE: Eliquis Code: Full PT/OT recs: None, none. TOC recs: none   Dispo: Anticipated discharge to Home in 2 days pending improving kidney function.   France Ravens, MD 08/24/2021, 7:11 AM Pager: [phone]  Please contact the on call pager after 5 pm and on weekends at [phone].

## 2021-08-24 NOTE — Progress Notes (Signed)
0600: Pt sitting in chair NAD noted, received 4 mg scheduled PO Dilaudid.  0630: Pt started screaming and in hysterics stating she was in pain-described pain in stomach as sharp and stabbing that radiated to back. Paged MD for pain medication per pt request.

## 2021-08-25 DIAGNOSIS — N179 Acute kidney failure, unspecified: Secondary | ICD-10-CM | POA: Diagnosis not present

## 2021-08-25 DIAGNOSIS — N10 Acute tubulo-interstitial nephritis: Secondary | ICD-10-CM | POA: Diagnosis not present

## 2021-08-25 DIAGNOSIS — G8929 Other chronic pain: Secondary | ICD-10-CM | POA: Diagnosis not present

## 2021-08-25 DIAGNOSIS — N1832 Chronic kidney disease, stage 3b: Secondary | ICD-10-CM | POA: Diagnosis not present

## 2021-08-25 LAB — BASIC METABOLIC PANEL
Anion gap: 15 (ref 5–15)
BUN: 37 mg/dL — ABNORMAL HIGH (ref 6–20)
CO2: 19 mmol/L — ABNORMAL LOW (ref 22–32)
Calcium: 9.2 mg/dL (ref 8.9–10.3)
Chloride: 105 mmol/L (ref 98–111)
Creatinine, Ser: 5.72 mg/dL — ABNORMAL HIGH (ref 0.44–1.00)
GFR, Estimated: 8 mL/min — ABNORMAL LOW (ref >60)
Glucose, Bld: 143 mg/dL — ABNORMAL HIGH (ref 70–99)
Potassium: 3.3 mmol/L — ABNORMAL LOW (ref 3.5–5.1)
Sodium: 139 mmol/L (ref 135–145)

## 2021-08-25 LAB — GLUCOSE, CAPILLARY
Glucose-Capillary: 118 mg/dL — ABNORMAL HIGH (ref 70–99)
Glucose-Capillary: 100 mg/dL — ABNORMAL HIGH (ref 70–99)
Glucose-Capillary: 115 mg/dL — ABNORMAL HIGH (ref 70–99)
Glucose-Capillary: 181 mg/dL — ABNORMAL HIGH (ref 70–99)

## 2021-08-25 MED ORDER — METOPROLOL SUCCINATE ER 25 MG PO TB24
25.0000 mg | ORAL_TABLET | Freq: Every day | ORAL | Status: DC
  Administered 2021-08-25 – 2021-08-28 (×4): 25 mg via ORAL
  Filled 2021-08-25 (×4): qty 1

## 2021-08-25 MED ORDER — INSULIN ASPART 100 UNIT/ML IJ SOLN
7.0000 [IU] | Freq: Three times a day (TID) | INTRAMUSCULAR | Status: DC
  Administered 2021-08-26 – 2021-08-28 (×5): 7 [IU] via SUBCUTANEOUS

## 2021-08-25 MED ORDER — POTASSIUM CHLORIDE CRYS ER 20 MEQ PO TBCR
40.0000 meq | EXTENDED_RELEASE_TABLET | Freq: Once | ORAL | Status: AC
  Administered 2021-08-25: 40 meq via ORAL
  Filled 2021-08-25: qty 2

## 2021-08-25 NOTE — Progress Notes (Signed)
Pt declining all morning medications at this time. States she will take them "later" and wishes to sleep. Medication education given; pt verbalized understanding. She remains alert and stable at baseline with no distress noted.

## 2021-08-25 NOTE — Progress Notes (Signed)
Multiple attempts for CHG bath today, pt continues to decline despite education on infection prevention. Pt agreeable to foley care.

## 2021-08-25 NOTE — Progress Notes (Signed)
HD#8 Subjective:  Overnight Events: NAEON. Mildly Tachycardic and hypertensive.  Patient seen and assessed at bedside. Resting comfortably.  Patient was very relieved to hear that her kidney function has continued to improve.  Patient wants to still think about whether she wants to undergo physical therapy while in the hospital.  Patient at baseline reports that she is normally in wheelchair.  Reports bilateral lower extremities are mildly more edematous than before.  Discussed that this will likely improve with more urine output.  Objective:  Vital signs in last 24 hours: Vitals:   08/24/21 0728 08/24/21 1610 08/24/21 2144 08/25/21 0634  BP: (!) 138/98 (!) 110/52 (!) 158/143 (!) 167/117  Pulse: (!) 110 100 (!) 110 (!) 116  Resp: '16 16 18 20  '$ Temp: 98 F (36.7 C) 97.9 F (36.6 C) 98.2 F (36.8 C) 97.7 F (36.5 C)  TempSrc: Oral Oral Oral Oral  SpO2: 100% 100% 100% 100%  Weight:      Height:       Supplemental O2: Room Air SpO2: 100 %   Physical Exam:  Physical Exam Constitutional:      General: She is not in acute distress.    Appearance: She is obese.  Eyes:     General: No scleral icterus. Cardiovascular:     Rate and Rhythm: Normal rate and regular rhythm.  Abdominal:     General: Bowel sounds are normal. There is no distension.     Palpations: Abdomen is soft. There is no mass.     Tenderness: There is abdominal tenderness. There is no guarding or rebound.     Hernia: No hernia is present.  Musculoskeletal:     Right lower leg: 2+ Pitting Edema (1+) present.     Left lower leg: 2+ Pitting Edema (1+) present.  Psychiatric:     Comments: [name], anxious     Filed Weights   08/15/21 1852 08/22/21 1801  Weight: 113.4 kg 129.7 kg     Intake/Output Summary (Last 24 hours) at 08/25/2021 0657 Last data filed at 08/25/2021 0500 Gross per 24 hour  Intake 480 ml  Output 2900 ml  Net -2420 ml    Net IO Since Admission: 7,062.88 mL [08/25/21  0657]  Pertinent Labs:    Latest Ref Rng & Units 08/24/2021    4:26 AM 08/23/2021    1:08 AM 08/22/2021    4:03 AM  CBC  WBC 4.0 - 10.5 K/uL 13.5  14.4  14.1   Hemoglobin 12.0 - 15.0 g/dL 7.9  7.5  8.2   Hematocrit 36.0 - 46.0 % 24.5  24.2  25.5   Platelets 150 - 400 K/uL 327  294  331        Latest Ref Rng & Units 08/25/2021    4:33 AM 08/24/2021    4:26 AM 08/23/2021    1:08 AM  CMP  Glucose 70 - 99 mg/dL 143  181  201   BUN 6 - 20 mg/dL 37  38  35   Creatinine 0.44 - 1.00 mg/dL 5.72  6.18  5.69   Sodium 135 - 145 mmol/L 139  136  137   Potassium 3.5 - 5.1 mmol/L 3.3  3.5  3.7   Chloride 98 - 111 mmol/L 105  105  102   CO2 22 - 32 mmol/L '19  19  20   '$ Calcium 8.9 - 10.3 mg/dL 9.2  9.0  8.8   Total Protein 6.5 - 8.1 g/dL  6.8  Total Bilirubin 0.3 - 1.2 mg/dL  0.4    Alkaline Phos 38 - 126 U/L  91    AST 15 - 41 U/L  9    ALT 0 - 44 U/L  <5      Imaging: No results found.  Assessment/Plan:   Principal Problem:   Acute pyelonephritis Active Problems:   Type 2 diabetes mellitus with hyperglycemia, with long-term current use of insulin (HCC)   Diabetic gastroparesis (HCC)   Anxiety   Abdominal pain   Chronic pain   Acute on chronic kidney failure ([place])   AKI (acute kidney injury) [facility])   Patient Summary: Ms. Sheral Pfahler is a 56 yo F with a PMH of T2DM c/b gastroparesis, HTN, HLD, paroxysmal atrial fibrillation on chronic anticoagulation with eliquis, CKD stage 4, class 3 obesity, chronic pain, h/o spinal stenosis mostly wheel chair bound, HFpEF presenting with abdominal pain and admitted for suspected pyelonephritis and AKI.  #AKI on CKD4 #ATN UOP 2.9 L. Cr 6.18>5.72 today. Repeat UA showed waxy casts. - Baseline Cr 1.84-2.54 in past 4 months.  - No signs of obstruction on renal ultrasound.  - Foley in place - Nephrology consulted, greatly appreciate assistance and recommendations -Trend I/O's and Kidney function with IV fluids -Repeat UA collected and  in process  - Continue sodium bicarbonate 650 mg BID  #Acute on Chronic Abdominal Pain Chronic pain treated with PO dilaudid.  - 4 mg PO dilaudid q8hrs for chronic pain -Continue outpatient GI care in [place] outpatient pain clinic -Simethicone q6 hour prn for flatulence - Continue Protonix  - Zofran for nausea and Compazine for refractory nausea - Continue robaxin 750 q8h - Continue oxybutnin 5 mg 3 times daily - Continue lidocaine patch every 24 hour  #Acute Pyelonephritis -Last day of Ceftriaxone (total abx: day 7 of 7).   #Hypomagnesemia, resolved  #HTN -holding home antihypertensives today in setting of AKI on CKD4 - Hold hydralazine 50 mg 3 times daily, hold home Imdur 60 mg daily, hold home metoprolol XL 50 mg daily  #T2DM Hypoglycemic yesterday down to 37 -Continue Semglee 32 u -SSI & CBG with 7 unit meal coverage qac and qhs   #PAF -Continue home Eliquis   #HFpEF LVEF 50-55% - hold hydralazine, torsemide 40 mg qd, aldactone 12.5 mg qd in setting of AKI - resume home metoprolol succinate at 1/2 dose 25 mg qd   #Anemia of Chronic Disease - Hgb Stable 7 to 8   Diet: Carb-Modified IVF: none VTE: Eliquis Code: Full PT/OT recs: None, none. TOC recs: none   Dispo: Anticipated discharge to Home in 2 days pending improving kidney function.   France Ravens, MD 08/25/2021, 6:57 AM Pager: [phone]  Please contact the on call pager after 5 pm and on weekends at [phone].

## 2021-08-25 NOTE — Progress Notes (Signed)
   08/25/21 0634  Assess: MEWS Score  Temp 97.7 F (36.5 C)  BP (!) 167/117  MAP (mmHg) 128  Pulse Rate (!) 116  Resp 20  SpO2 100 %  O2 Device Room Air  Assess: MEWS Score  MEWS Temp 0  MEWS Systolic 0  MEWS Pulse 2  MEWS RR 0  MEWS LOC 0  MEWS Score 2  MEWS Score Color Yellow  Assess: if the MEWS score is Yellow or Red  Were vital signs taken at a resting state? Yes  Focused Assessment No change from prior assessment  Does the patient meet 2 or more of the SIRS criteria? No  MEWS guidelines implemented *See Row Information* Yes  Treat  MEWS Interventions Other (Comment)  Take Vital Signs  Increase Vital Sign Frequency  Yellow: Q 2hr X 2 then Q 4hr X 2, if remains yellow, continue Q 4hrs  Escalate  MEWS: Escalate Yellow: discuss with charge nurse/RN and consider discussing with provider and RRT  Notify: Charge Nurse/RN  Name of Charge Nurse/RN Notified Jacquelynn Cree  Date Charge Nurse/RN Notified 08/25/21  Time Charge Nurse/RN Notified [phone]  Notify: Provider  Provider Name/Title Dr Bevelyn Buckles  Date Provider Notified 08/25/21  Time Provider Notified [phone]  Method of Notification Page  Notification Reason Other (Comment) (increased heart rate)  Provider response No new orders  Date of Provider Response 08/25/21  Time of Provider Response [phone]  Document  Patient Outcome Other (Comment)  Progress note created (see row info) Yes  Assess: SIRS CRITERIA  SIRS Temperature  0  SIRS Pulse 1  SIRS Respirations  0  SIRS WBC 0  SIRS Score Sum  1

## 2021-08-25 NOTE — Progress Notes (Signed)
Mobility Specialist Progress Note:   08/25/21 1020  Mobility  Activity  (bed level exercises)  Range of Motion/Exercises Active;Right leg;Left leg  Level of Assistance Independent  Assistive Device None  Activity Response Tolerated fair  $Mobility charge 1 Mobility   Pt received asleep in bed, easy to arouse. Pt with increased swelling in BLE. Able to perform bed level exercises. Declined any other mobility. Will f/u later today to get OOB.  Nelta Numbers Acute Rehab Secure Chat or Office Phone: [phone]

## 2021-08-25 NOTE — Progress Notes (Signed)
[facility] Progress Note    Assessment/ Plan:   AKI on CKD 4 - b/l creat 1.84- 2.54, from feb- may 2023, 21- 32 ml/min.  Creat 4.1 on admission in setting of UTI and suspected pyelonephritis. Creat improved to 1.7 several days later. After this the creatinine climbed again up to 5.1.  UA from 6/10 looked infected. Renal US x 2 w/o obstruction, 2nd was on 6/16. On exam, she has clear lungs and mild lower leg edema bilat, no other edema. No uremic signs or symptoms.  - has a Foley now- making great urine-- ? Urinary retention - Cr decreasing now- excellent - no indication for RRT at present - on Na bicarb - s/p IVFs, would not give any more at present  2.  Volume - per patient her "normal weight" is between 250 and 325 lbs. Weighed in a 280 lbs  3. UTI - ecoli on urine cx, blood cxs negative. Still receiving rocephin. 4.  HTN - bp's relatively softening over the last 2-3 days. Have dc'd metoprolol and hydralazine altogether and dc'd imdur as well.   5.  Chronic syst CHF - last EF 50-55%.   6.  Afib: on Eliquis  Subjective:    Seen in room. Sleeping and easily arousable today, no complaints.  Making more urine and Cr finally trending down.     Objective:   BP (!) 162/86 (BP Location: Left Arm)   Pulse (!) 113   Temp 98 F (36.7 C) (Oral)   Resp 19   Ht '5\' 6"'$  (1.676 m)   Wt 131.9 kg   LMP 10/10/2012   SpO2 (!) 82%   BMI 46.93 kg/m   Intake/Output Summary (Last 24 hours) at 08/25/2021 1415 Last data filed at 08/25/2021 1335 Gross per 24 hour  Intake 480 ml  Output 4100 ml  Net -3620 ml   Weight change:   Physical Exam: Gen: NAD, sleeping, easily arousable NECK: no JVD CVS: RRR Resp: clear GU: Foley in place draining clear yellow urine Ext: 1+ LE edema bilaterally  Imaging: No results found.  Labs: BMET Recent Labs  Lab 08/19/21 0604 08/20/21 0011 08/21/21 0245 08/22/21 0403 08/23/21 0108 08/24/21 0426 08/25/21 0433  NA 138 134* 137 133* 137  136 139  K 3.7 3.2* 4.0 3.8 3.7 3.5 3.3*  CL 104 98 105 100 102 105 105  CO2 24 23 19* 18* 20* 19* 19*  GLUCOSE 164* 203* 215* 281* 201* 181* 143*  BUN 20 21* 23* 29* 35* 38* 37*  CREATININE 1.72* 3.08* 4.25* 5.09* 5.69* 6.18* 5.72*  CALCIUM 9.1 8.6* 8.3* 8.4* 8.8* 9.0 9.2   CBC Recent Labs  Lab 08/21/21 0245 08/22/21 0403 08/23/21 0108 08/24/21 0426  WBC 12.9* 14.1* 14.4* 13.5*  NEUTROABS  --   --   --  10.3*  HGB 8.3* 8.2* 7.5* 7.9*  HCT 26.7* 25.5* 24.2* 24.5*  MCV 97.1 96.2 98.8 95.3  PLT 309 331 294 327    Medications:     apixaban  5 mg Oral BID   Chlorhexidine Gluconate Cloth  6 each Topical Daily   DULoxetine  40 mg Oral Daily   HYDROmorphone  4 mg Oral Q8H   insulin aspart  0-15 Units Subcutaneous TID WC   insulin aspart  7 Units Subcutaneous TID WC   insulin glargine-yfgn  35 Units Subcutaneous QHS   lidocaine  1 patch Transdermal Daily   methocarbamol  750 mg Oral Q8H   metoprolol succinate  25 mg  Oral Daily   oxybutynin  5 mg Oral TID   pantoprazole  40 mg Oral Daily   senna-docusate  1 tablet Oral BID   sodium bicarbonate  650 mg Oral BID      Madelon Lips MD 08/25/2021, 2:15 PM

## 2021-08-26 ENCOUNTER — Encounter: Payer: Medicare Other | Admitting: Registered Nurse

## 2021-08-26 DIAGNOSIS — N10 Acute tubulo-interstitial nephritis: Secondary | ICD-10-CM | POA: Diagnosis not present

## 2021-08-26 DIAGNOSIS — N1832 Chronic kidney disease, stage 3b: Secondary | ICD-10-CM | POA: Diagnosis not present

## 2021-08-26 DIAGNOSIS — G8929 Other chronic pain: Secondary | ICD-10-CM | POA: Diagnosis not present

## 2021-08-26 DIAGNOSIS — N179 Acute kidney failure, unspecified: Secondary | ICD-10-CM | POA: Diagnosis not present

## 2021-08-26 LAB — CBC
HCT: 24.4 % — ABNORMAL LOW (ref 36.0–46.0)
Hemoglobin: 7.6 g/dL — ABNORMAL LOW (ref 12.0–15.0)
MCH: 30.6 pg (ref 26.0–34.0)
MCHC: 31.1 g/dL (ref 30.0–36.0)
MCV: 98.4 fL (ref 80.0–100.0)
Platelets: 343 10*3/uL (ref 150–400)
RBC: 2.48 MIL/uL — ABNORMAL LOW (ref 3.87–5.11)
RDW: 13.1 % (ref 11.5–15.5)
WBC: 11 10*3/uL — ABNORMAL HIGH (ref 4.0–10.5)
nRBC: 0 % (ref 0.0–0.2)

## 2021-08-26 LAB — BASIC METABOLIC PANEL
Anion gap: 12 (ref 5–15)
BUN: 36 mg/dL — ABNORMAL HIGH (ref 6–20)
CO2: 19 mmol/L — ABNORMAL LOW (ref 22–32)
Calcium: 8.4 mg/dL — ABNORMAL LOW (ref 8.9–10.3)
Chloride: 107 mmol/L (ref 98–111)
Creatinine, Ser: 5.3 mg/dL — ABNORMAL HIGH (ref 0.44–1.00)
GFR, Estimated: 9 mL/min — ABNORMAL LOW (ref >60)
Glucose, Bld: 223 mg/dL — ABNORMAL HIGH (ref 70–99)
Potassium: 3.1 mmol/L — ABNORMAL LOW (ref 3.5–5.1)
Sodium: 138 mmol/L (ref 135–145)

## 2021-08-26 LAB — GLUCOSE, CAPILLARY
Glucose-Capillary: 142 mg/dL — ABNORMAL HIGH (ref 70–99)
Glucose-Capillary: 152 mg/dL — ABNORMAL HIGH (ref 70–99)
Glucose-Capillary: 252 mg/dL — ABNORMAL HIGH (ref 70–99)
Glucose-Capillary: 151 mg/dL — ABNORMAL HIGH (ref 70–99)

## 2021-08-26 LAB — MAGNESIUM: Magnesium: 1.4 mg/dL — ABNORMAL LOW (ref 1.7–2.4)

## 2021-08-26 MED ORDER — POTASSIUM CHLORIDE CRYS ER 20 MEQ PO TBCR
40.0000 meq | EXTENDED_RELEASE_TABLET | ORAL | Status: AC
  Administered 2021-08-26 (×2): 40 meq via ORAL
  Filled 2021-08-26: qty 2

## 2021-08-26 MED ORDER — MAGNESIUM SULFATE 2 GM/50ML IV SOLN
INTRAVENOUS | Status: AC
  Filled 2021-08-26: qty 50

## 2021-08-26 MED ORDER — MAGNESIUM SULFATE 4 GM/100ML IV SOLN
4.0000 g | Freq: Once | INTRAVENOUS | Status: AC
  Administered 2021-08-26: 4 g via INTRAVENOUS
  Filled 2021-08-26: qty 100

## 2021-08-26 NOTE — Progress Notes (Signed)
Foley removed at this time; pt tolerated well. DTV by 1600.

## 2021-08-26 NOTE — Progress Notes (Signed)
Loose BM at this time. Pt is unsure whether she had any UO. Although hat is present in [facility], unable to isolate any UO from BM.

## 2021-08-26 NOTE — Progress Notes (Signed)
[facility] Progress Note    Assessment/ Plan:   AKI on CKD 4 - b/l creat 1.84- 2.54, from feb- may 2023, 21- 32 ml/min.  Creat 4.1 on admission in setting of UTI and suspected pyelonephritis. Creat improved to 1.7 several days later. After this the creatinine climbed again up to 5.1.  UA from 6/10 looked infected. Renal US x 2 w/o obstruction, 2nd was on 6/16. On exam, she has clear lungs and mild lower leg edema bilat, no other edema. No uremic signs or symptoms.  - has a Foley now- making great urine-- ? Urinary retention - Cr decreasing now- excellent - no indication for RRT at present - on Na bicarb - s/p IVFs, would not give any more at present  2.  Volume - per patient her "normal weight" is between 250 and 325 lbs. Weighed in a 280 lbs  3. UTI - ecoli on urine cx, blood cxs negative. Still receiving rocephin. 4.  HTN - Bps were soft and so meds were d/c'd.  Now climbing again, will add back metop today.   5.  Chronic syst CHF - last EF 50-55%.   6.  Afib: on Eliquis 7.  Anemia: checking iron stores  Subjective:    Seen in room.  Sitting up in chair, Foley removed this AM.  Tolerated well.     Objective:   BP 140/76 (BP Location: Left Wrist)   Pulse 99   Temp 97.6 F (36.4 C)   Resp 18   Ht '5\' 6"'$  (1.676 m)   Wt 131.9 kg   LMP 10/10/2012   SpO2 100%   BMI 46.93 kg/m   Intake/Output Summary (Last 24 hours) at 08/26/2021 1314 Last data filed at 08/26/2021 1134 Gross per 24 hour  Intake 1520 ml  Output 2200 ml  Net -680 ml   Weight change:   Physical Exam: Gen: sitting up, smiling today NECK: no JVD CVS: RRR Resp: clear Ext: 1+ LE edema bilaterally  Imaging: No results found.  Labs: BMET Recent Labs  Lab 08/20/21 0011 08/21/21 0245 08/22/21 0403 08/23/21 0108 08/24/21 0426 08/25/21 0433 08/26/21 0351  NA 134* 137 133* 137 136 139 138  K 3.2* 4.0 3.8 3.7 3.5 3.3* 3.1*  CL 98 105 100 102 105 105 107  CO2 23 19* 18* 20* 19* 19* 19*   GLUCOSE 203* 215* 281* 201* 181* 143* 223*  BUN 21* 23* 29* 35* 38* 37* 36*  CREATININE 3.08* 4.25* 5.09* 5.69* 6.18* 5.72* 5.30*  CALCIUM 8.6* 8.3* 8.4* 8.8* 9.0 9.2 8.4*   CBC Recent Labs  Lab 08/22/21 0403 08/23/21 0108 08/24/21 0426 08/26/21 0351  WBC 14.1* 14.4* 13.5* 11.0*  NEUTROABS  --   --  10.3*  --   HGB 8.2* 7.5* 7.9* 7.6*  HCT 25.5* 24.2* 24.5* 24.4*  MCV 96.2 98.8 95.3 98.4  PLT 331 294 327 343    Medications:     apixaban  5 mg Oral BID   Chlorhexidine Gluconate Cloth  6 each Topical Daily   DULoxetine  40 mg Oral Daily   HYDROmorphone  4 mg Oral Q8H   insulin aspart  0-15 Units Subcutaneous TID WC   insulin aspart  7 Units Subcutaneous TID WC   insulin glargine-yfgn  35 Units Subcutaneous QHS   lidocaine  1 patch Transdermal Daily   methocarbamol  750 mg Oral Q8H   metoprolol succinate  25 mg Oral Daily   oxybutynin  5 mg Oral TID  pantoprazole  40 mg Oral Daily   potassium chloride  40 mEq Oral Q4H   senna-docusate  1 tablet Oral BID   sodium bicarbonate  650 mg Oral BID      Madelon Lips MD 08/26/2021, 1:14 PM

## 2021-08-26 NOTE — Progress Notes (Addendum)
Pt has had no measurable UO since foley removal at 1000. Pt reported a small amount of urine with loose BMs around 1100 and 1400; but it was not able to be isolated from stool for assessment/measurement.  Attempted to bladder scan pt at this time, but pt refused. Educated pt and family at bedside the importance of bladder scan, especially if she has not had good UO since foley removal. Both her mother and the pt verbalize understanding.  However, pt is requesting to have bladder scan done after dinner and after her family is done visiting. I told pt I will be back in an hour to bladder scan. Pt verbalizes agreement. MD notified

## 2021-08-26 NOTE — Progress Notes (Signed)
Bladder scan showed 148m. MD updated.

## 2021-08-26 NOTE — Progress Notes (Signed)
HD#8 Subjective:  Overnight Events: NAEON.   Patient seen and assessed at bedside.  Patient reports that she is doing well overall.  Discussed kidney function continues to improve and plan for Foley removal for voiding trial.  Discussed that PT would need to evaluate patient prior to discharge.  Patient was amenable to having PT follow-up.  Objective:  Vital signs in last 24 hours: Vitals:   08/25/21 2100 08/26/21 0010 08/26/21 0400 08/26/21 0611  BP:  137/77 131/73 140/76  Pulse:  89 90 99  Resp:    18  Temp: 97.9 F (36.6 C)   97.6 F (36.4 C)  TempSrc: Oral     SpO2:    100%  Weight:      Height:       Supplemental O2: Room Air SpO2: 100 %   Physical Exam:  Physical Exam Constitutional:      General: She is not in acute distress.    Appearance: She is obese.  Eyes:     General: No scleral icterus. Cardiovascular:     Rate and Rhythm: Normal rate and regular rhythm.  Abdominal:     General: Bowel sounds are normal. There is no distension.     Palpations: Abdomen is soft. There is no mass.     Tenderness: There is abdominal tenderness. There is no guarding or rebound.     Hernia: No hernia is present.  Musculoskeletal:     Right lower leg: 2+ Pitting Edema (1+) present.     Left lower leg: 2+ Pitting Edema (1+) present.  Psychiatric:     CommentsMarton [name], anxious     Filed Weights   08/15/21 1852 08/22/21 1801 08/25/21 0822  Weight: 113.4 kg 129.7 kg 131.9 kg     Intake/Output Summary (Last 24 hours) at 08/26/2021 0925 Last data filed at 08/26/2021 4098 Gross per 24 hour  Intake 1040 ml  Output 1800 ml  Net -760 ml    Net IO Since Admission: 6,302.88 mL [08/26/21 0925]  Pertinent Labs:    Latest Ref Rng & Units 08/24/2021    4:26 AM 08/23/2021    1:08 AM 08/22/2021    4:03 AM  CBC  WBC 4.0 - 10.5 K/uL 13.5  14.4  14.1   Hemoglobin 12.0 - 15.0 g/dL 7.9  7.5  8.2   Hematocrit 36.0 - 46.0 % 24.5  24.2  25.5   Platelets 150 - 400 K/uL 327  294   331        Latest Ref Rng & Units 08/26/2021    3:51 AM 08/25/2021    4:33 AM 08/24/2021    4:26 AM  CMP  Glucose 70 - 99 mg/dL 223  143  181   BUN 6 - 20 mg/dL 36  37  38   Creatinine 0.44 - 1.00 mg/dL 5.30  5.72  6.18   Sodium 135 - 145 mmol/L 138  139  136   Potassium 3.5 - 5.1 mmol/L 3.1  3.3  3.5   Chloride 98 - 111 mmol/L 107  105  105   CO2 22 - 32 mmol/L '19  19  19   '$ Calcium 8.9 - 10.3 mg/dL 8.4  9.2  9.0   Total Protein 6.5 - 8.1 g/dL   6.8   Total Bilirubin 0.3 - 1.2 mg/dL   0.4   Alkaline Phos 38 - 126 U/L   91   AST 15 - 41 U/L   9   ALT 0 -  44 U/L   <5    Mg 1.4  Imaging: No results found.  Assessment/Plan:   Principal Problem:   Acute pyelonephritis Active Problems:   Type 2 diabetes mellitus with hyperglycemia, with long-term current use of insulin (HCC)   Diabetic gastroparesis (HCC)   Anxiety   Abdominal pain   Chronic pain   Acute on chronic kidney failure ([place])   AKI (acute kidney injury) [facility])   Patient Summary: [name] is a 56 yo F with a PMH of T2DM c/b gastroparesis, HTN, HLD, paroxysmal atrial fibrillation on chronic anticoagulation with eliquis, CKD stage 4, class 3 obesity, chronic pain, h/o spinal stenosis mostly wheel chair bound, HFpEF presenting with abdominal pain and admitted for suspected pyelonephritis and AKI.  #AKI on CKD4 #ATN #Acute Pyelonephritis, treated UOP 1.8 L. Cr 6.18>5.72 today. Repeat UA showed waxy casts. May be a component of cardiorenal but she appears to be auto-diuresing well - Baseline Cr 1.84-2.54 in past 4 months.  - No signs of obstruction on renal ultrasound.  - Voiding trial today - Nephrology consulted, greatly appreciate assistance and recommendations -Trend I/O's and Kidney function with IV fluids - Continue sodium bicarbonate 650 mg BID  #Acute on Chronic Abdominal Pain Chronic pain treated with PO dilaudid.  - 4 mg PO dilaudid q8hrs for chronic pain -Continue outpatient GI care in  [employer] outpatient pain clinic -Simethicone q6 hour prn for flatulence - Continue Protonix  - Zofran for nausea and Compazine for refractory nausea - Continue robaxin 750 q8h - Continue oxybutnin 5 mg 3 times daily - Continue lidocaine patch every 24 hour  #Hypomagnesemia #Hypokalemia -Repleted today with IV Mag sulfate and PO kdur  #HTN -holding home antihypertensives today in setting of AKI on CKD4 - Hold hydralazine 50 mg 3 times daily, hold home Imdur 60 mg daily  #T2DM Hypoglycemic yesterday down to 37 -Continue Semglee 32 u -SSI & CBG with 7 unit meal coverage qac and qhs   #PAF -Continue home Eliquis   #HFpEF LVEF 50-55% - hold hydralazine, torsemide 40 mg qd, aldactone 12.5 mg qd in setting of AKI - resume home metoprolol succinate at 1/2 dose 25 mg qd   #Anemia of Chronic Disease - Hgb Stable 7 to 8   Diet: Carb-Modified IVF: none VTE: Eliquis Code: Full PT/OT recs: None, none. TOC recs: none   Dispo: Anticipated discharge to Home in 2 days pending improving kidney function.   Deborah Ravens, MD 08/26/2021, 9:25 AM Pager: [phone]  Please contact the on call pager after 5 pm and on weekends at [phone].

## 2021-08-26 NOTE — Progress Notes (Signed)
Pt declined 0800 scheduled meds at this time. Requested for RN to "come back later". Meds re-timed for 1000.  Reviewed plan of care with pt, including foley removal. Pt agreeable.

## 2021-08-26 NOTE — Progress Notes (Signed)
Pt OOB to chair for meal. Pt requesting multiple food items not compliant with her Renal diet. Reviewed importance of diet restriction. Pt verbalizes understanding, reinforcement needed.  UO post foley removal is unmeasured d/t BM.

## 2021-08-26 NOTE — Progress Notes (Signed)
PT Cancellation Note  Patient Details Name: Deborah [name] MRN: [id number] DOB: 08-25-1965   Cancelled Treatment:    Reason Eval/Treat Not Completed: Patient declined, no reason specified Pt states she just woke up and is too tired to participate. Therapist explained the need to evaluate pts mobility to ensure safe discharge home, pt verbalized understanding but still refusing. PT will follow up with patient tomorrow.   Mackie Pai, SPT Acute Rehabilitation Services  Office: [phone]  Mackie Pai 08/26/2021, 3:22 PM

## 2021-08-27 DIAGNOSIS — R109 Unspecified abdominal pain: Secondary | ICD-10-CM | POA: Diagnosis not present

## 2021-08-27 DIAGNOSIS — N179 Acute kidney failure, unspecified: Secondary | ICD-10-CM | POA: Diagnosis not present

## 2021-08-27 DIAGNOSIS — G8929 Other chronic pain: Secondary | ICD-10-CM | POA: Diagnosis not present

## 2021-08-27 DIAGNOSIS — N10 Acute tubulo-interstitial nephritis: Secondary | ICD-10-CM | POA: Diagnosis not present

## 2021-08-27 LAB — BASIC METABOLIC PANEL
Anion gap: 11 (ref 5–15)
BUN: 33 mg/dL — ABNORMAL HIGH (ref 6–20)
CO2: 20 mmol/L — ABNORMAL LOW (ref 22–32)
Calcium: 8.5 mg/dL — ABNORMAL LOW (ref 8.9–10.3)
Chloride: 103 mmol/L (ref 98–111)
Creatinine, Ser: 5.24 mg/dL — ABNORMAL HIGH (ref 0.44–1.00)
GFR, Estimated: 9 mL/min — ABNORMAL LOW (ref >60)
Glucose, Bld: 156 mg/dL — ABNORMAL HIGH (ref 70–99)
Potassium: 2.8 mmol/L — ABNORMAL LOW (ref 3.5–5.1)
Sodium: 134 mmol/L — ABNORMAL LOW (ref 135–145)

## 2021-08-27 LAB — FERRITIN: Ferritin: 466 ng/mL — ABNORMAL HIGH (ref 11–307)

## 2021-08-27 LAB — MAGNESIUM: Magnesium: 1.9 mg/dL (ref 1.7–2.4)

## 2021-08-27 LAB — GLUCOSE, CAPILLARY
Glucose-Capillary: 132 mg/dL — ABNORMAL HIGH (ref 70–99)
Glucose-Capillary: 225 mg/dL — ABNORMAL HIGH (ref 70–99)
Glucose-Capillary: 171 mg/dL — ABNORMAL HIGH (ref 70–99)
Glucose-Capillary: 163 mg/dL — ABNORMAL HIGH (ref 70–99)

## 2021-08-27 LAB — IRON AND TIBC
Iron: 52 ug/dL (ref 28–170)
Saturation Ratios: 16 % (ref 10.4–31.8)
TIBC: 329 ug/dL (ref 250–450)
UIBC: 277 ug/dL

## 2021-08-27 LAB — RENAL FUNCTION PANEL
Albumin: 3 g/dL — ABNORMAL LOW (ref 3.5–5.0)
Anion gap: 15 (ref 5–15)
BUN: 35 mg/dL — ABNORMAL HIGH (ref 6–20)
CO2: 19 mmol/L — ABNORMAL LOW (ref 22–32)
Calcium: 8.8 mg/dL — ABNORMAL LOW (ref 8.9–10.3)
Chloride: 104 mmol/L (ref 98–111)
Creatinine, Ser: 5.17 mg/dL — ABNORMAL HIGH (ref 0.44–1.00)
GFR, Estimated: 9 mL/min — ABNORMAL LOW (ref >60)
Glucose, Bld: 154 mg/dL — ABNORMAL HIGH (ref 70–99)
Phosphorus: 4.9 mg/dL — ABNORMAL HIGH (ref 2.5–4.6)
Potassium: 2.9 mmol/L — ABNORMAL LOW (ref 3.5–5.1)
Sodium: 138 mmol/L (ref 135–145)

## 2021-08-27 MED ORDER — POTASSIUM CHLORIDE CRYS ER 20 MEQ PO TBCR
40.0000 meq | EXTENDED_RELEASE_TABLET | ORAL | Status: AC
  Administered 2021-08-27 (×2): 40 meq via ORAL
  Filled 2021-08-27 (×3): qty 2

## 2021-08-27 MED ORDER — HYDROMORPHONE HCL 2 MG PO TABS
1.0000 mg | ORAL_TABLET | Freq: Once | ORAL | Status: AC
  Administered 2021-08-27: 1 mg via ORAL
  Filled 2021-08-27: qty 1

## 2021-08-27 NOTE — Progress Notes (Signed)
MD made aware of patients refusal of her potassium pill.

## 2021-08-27 NOTE — Progress Notes (Signed)
Mobility Specialist Progress Note:   08/27/21 0905  Mobility  Activity  (bed level exercises)  Range of Motion/Exercises Active;All extremities  Level of Assistance Independent  Activity Response Tolerated fair  $Mobility charge 1 Mobility   Pt sleeping upon arrival, easy to arouse. Declined OOB mobility d/t not sleeping well last night. Performed limited amount of bed level exercises. Encouraged OOB mobility later today with PT, pt agreeable.   Nelta Numbers Acute Rehab Secure Chat or Office Phone: [phone]

## 2021-08-27 NOTE — TOC CM/SW Note (Cosign Needed)
    Durable Medical Equipment  (From admission, onward)           Start     Ordered   08/27/21 1342  For home use only DME Hospital bed  Once       Question Answer Comment  Length of Need Lifetime   Patient has (list medical condition): chronic pain, h/o spinal stenosis   The above medical condition requires: Patient requires the ability to reposition frequently   Head must be elevated greater than: 45 degrees   Bed type Semi-electric   Support Surface: Gel Overlay      08/27/21 1341

## 2021-08-27 NOTE — Progress Notes (Addendum)
HD#8 Subjective:  Overnight Events: NAEON.   Patient seen and assessed at bedside. Reports having rough night last night due to abdominal pain. Denies acute concern.  Was sleeping comfortably on arrival.  Discussed discontinuing oxybutynin because of anticholinergic effects.  Patient verbalized understanding.  Encourage patient to participate more in PT today due to extended hospitalization.  Objective:  Vital signs in last 24 hours: Vitals:   08/26/21 0400 08/26/21 0611 08/26/21 1656 08/27/21 0602  BP: 131/73 140/76 (!) 146/85 (!) 152/88  Pulse: 90 99 93 94  Resp:  '18 17 17  '$ Temp:  97.6 F (36.4 C) 98.3 F (36.8 C) 98.3 F (36.8 C)  TempSrc:    Oral  SpO2:  100% 99% 98%  Weight:    132.8 kg  Height:       Supplemental O2: Room Air SpO2: 98 %   Physical Exam:  Physical Exam Constitutional:      General: She is not in acute distress.    Appearance: She is obese.  Eyes:     General: No scleral icterus. Cardiovascular:     Rate and Rhythm: Normal rate and regular rhythm.  Abdominal:     General: Bowel sounds are normal. There is no distension.     Palpations: Abdomen is soft. There is no mass.     Tenderness: There is abdominal tenderness. There is no guarding or rebound.     Hernia: No hernia is present.  Musculoskeletal:     Right lower leg: 2+ Pitting Edema (1+) present.     Left lower leg: 2+ Pitting Edema (1+) present.  Psychiatric:     CommentsMarton [name], anxious     Filed Weights   08/22/21 1801 08/25/21 0822 08/27/21 0602  Weight: 129.7 kg 131.9 kg 132.8 kg     Intake/Output Summary (Last 24 hours) at 08/27/2021 0659 Last data filed at 08/26/2021 2334 Gross per 24 hour  Intake 720 ml  Output 700 ml  Net 20 ml    Net IO Since Admission: 6,322.88 mL [08/27/21 0659]  Pertinent Labs:    Latest Ref Rng & Units 08/26/2021    3:51 AM 08/24/2021    4:26 AM 08/23/2021    1:08 AM  CBC  WBC 4.0 - 10.5 K/uL 11.0  13.5  14.4   Hemoglobin 12.0 - 15.0  g/dL 7.6  7.9  7.5   Hematocrit 36.0 - 46.0 % 24.4  24.5  24.2   Platelets 150 - 400 K/uL 343  327  294        Latest Ref Rng & Units 08/27/2021   12:40 AM 08/26/2021    3:51 AM 08/25/2021    4:33 AM  CMP  Glucose 70 - 99 mg/dL 70 - 99 mg/dL 154    156  223  143   BUN 6 - 20 mg/dL 6 - 20 mg/dL 35    33  36  37   Creatinine 0.44 - 1.00 mg/dL 0.44 - 1.00 mg/dL 5.17    5.24  5.30  5.72   Sodium 135 - 145 mmol/L 135 - 145 mmol/L 138    134  138  139   Potassium 3.5 - 5.1 mmol/L 3.5 - 5.1 mmol/L 2.9    2.8  3.1  3.3   Chloride 98 - 111 mmol/L 98 - 111 mmol/L 104    103  107  105   CO2 22 - 32 mmol/L 22 - 32 mmol/L 19    20  19  19   Calcium 8.9 - 10.3 mg/dL 8.9 - 10.3 mg/dL 8.8    8.5  8.4  9.2     Imaging: No results found.  Assessment/Plan:   Principal Problem:   Acute pyelonephritis Active Problems:   Type 2 diabetes mellitus with hyperglycemia, with long-term current use of insulin (HCC)   Diabetic gastroparesis (HCC)   Anxiety   Abdominal pain   Chronic pain   Acute on chronic kidney failure ([place])   AKI (acute kidney injury) [facility])   Patient Summary: [name] is a 56 yo F with a PMH of T2DM c/b gastroparesis, HTN, HLD, paroxysmal atrial fibrillation on chronic anticoagulation with eliquis, CKD stage 4, class 3 obesity, chronic pain, h/o spinal stenosis mostly wheel chair bound, HFpEF presenting with abdominal pain and admitted for suspected pyelonephritis and AKI.  #AKI on CKD4 #ATN #Acute Pyelonephritis, treated #Bladder retention UOP 0.7 L. Daily net +20. Cr 5.30>5.24 today. Will do bladder scans and I&O cath for today. Possibility that urinary retention is secondary to progression of lumbar stenosis. If still retaining tomorrow, may require foley and outpatient urology referral.  - Baseline Cr 1.84-2.54 in past 4 months.  - No signs of obstruction on renal ultrasound.  - Nephrology consulted, greatly appreciate assistance and  recommendations - Strict I&O - Continue sodium bicarbonate 650 mg BID - Bladder scans q6h, I&O catheter if exceeding 300 cc - PT eval and treat  #Acute on Chronic Abdominal Pain Chronic pain treated with PO dilaudid.  - 4 mg PO dilaudid q8hrs for chronic pain -Continue outpatient GI care in [place] outpatient pain clinic -Simethicone q6 hour prn for flatulence - Continue Protonix  - Zofran for nausea and Compazine for refractory nausea - Continue robaxin 750 q8h - Discontinue oxybutnin due to anticholinergic side effects - Continue lidocaine patch every 24 hour  #Hypomagnesemia #Hypokalemia Poor PO intake and hydration -Repleted today with IV Mag sulfate and PO kdur  #HTN -holding home antihypertensives today in setting of AKI on CKD4 - Hold hydralazine 50 mg 3 times daily, hold home Imdur 60 mg daily  #T2DM Hypoglycemic yesterday down to 37 -Continue Semglee 32 u -SSI & CBG with 7 unit meal coverage qac and qhs   #PAF -Continue home Eliquis   #HFpEF LVEF 50-55% - hold hydralazine, torsemide 40 mg qd, aldactone 12.5 mg qd in setting of AKI - Continue metop succinate 25 mg qd   #Anemia of Chronic Disease - Hgb Stable 7 to 8   Diet: Carb-Modified IVF: none VTE: Eliquis Code: Full PT/OT recs: None, none. TOC recs: none   Dispo: Anticipated discharge to Home in 2 days pending improving kidney function.   Deborah Ravens, MD 08/27/2021, 6:59 AM Pager: [phone]  Please contact the on call pager after 5 pm and on weekends at [phone].

## 2021-08-27 NOTE — Evaluation (Signed)
Physical Therapy Evaluation and D/C Patient Details Name: Deborah [name] MRN: [id number] DOB: 1965/11/14 Today's Date: 08/27/2021  History of Present Illness  [name] is a 56 yo F admitted 6/10 presenting with abdominal pain and admitted for suspected pyelonephritis and AKI. PMH of T2DM c/b gastroparesis, HTN, HLD, paroxysmal atrial fibrillation on chronic anticoagulation with eliquis, CKD stage 4, class 3 obesity, chronic pain, h/o spinal stenosis mostly wheel chair bound, HFpEF  Clinical Impression  Pt admitted with above diagnosis. Pt was able to transfer independently to recliner and is wheelchair level at home PTA.  Has not ambulated in 6 years. Pt demonstrated good safety as well.  Mobility team can follow to ensure pt is getting OOB a few times a day.  Will sign off as there are no goals for PT.     Recommendations for follow up therapy are one component of a multi-disciplinary discharge planning process, led by the attending physician.  Recommendations may be updated based on patient status, additional functional criteria and insurance authorization.  Follow Up Recommendations No PT follow up      Assistance Recommended at Discharge PRN  Patient can return home with the following  Help with stairs or ramp for entrance;Assist for transportation;Assistance with cooking/housework    Equipment Recommendations Hospital bed (Pt requests hospital bed.  It would benefit pt at home.)  Recommendations for Other Services       Functional Status Assessment Patient has not had a recent decline in their functional status     Precautions / Restrictions Precautions Precautions: Fall Restrictions Weight Bearing Restrictions: No      Mobility  Bed Mobility Overal bed mobility: Independent                  Transfers Overall transfer level: Modified independent                 General transfer comment: Able to stand and pivot to recliner without assist  and no LOB. Pt is transfers only PTA.    Ambulation/Gait                  Stairs            Wheelchair Mobility    Modified Rankin (Stroke Patients Only)       Balance Overall balance assessment: No apparent balance deficits (not formally assessed)                                           Pertinent Vitals/Pain Pain Assessment Pain Assessment: No/denies pain    Home Living Family/patient expects to be discharged to:: Private residence Living Arrangements: Alone Available Help at Discharge: Family;Available PRN/intermittently Type of Home: Apartment Home Access: Level entry       Home Layout: One level Home Equipment: BSC/3in1;Shower seat;Wheelchair - manual Additional Comments: Aide M-Sat a few hours day    Prior Function Prior Level of Function : Needs assist             Mobility Comments: pt reports using w/c at all times and can transfer without assist,pt can do things on her own per pt,  doesn't ambulate at home, reports spouse assists "with whatever I need".  Used wheelchair at all times due to spinal stenosis x 6 years ADLs Comments: pt reports spouse assists "with whatever I need"     Hand Dominance  Dominant Hand: Right    Extremity/Trunk Assessment   Upper Extremity Assessment Upper Extremity Assessment: Defer to OT evaluation    Lower Extremity Assessment Lower Extremity Assessment: Generalized weakness    Cervical / Trunk Assessment Cervical / Trunk Assessment: Kyphotic  Communication   Communication:  (pt not responding to questions from mom or therapist)  Cognition Arousal/Alertness: Awake/alert Behavior During Therapy: WFL for tasks assessed/performed Overall Cognitive Status: Within Functional Limits for tasks assessed                                          General Comments      Exercises     Assessment/Plan    PT Assessment Patient does not need any further PT services   PT Problem List         PT Treatment Interventions      PT Goals (Current goals can be found in the Care Plan section)  Acute Rehab PT Goals Patient Stated Goal: to go home PT Goal Formulation: All assessment and education complete, DC therapy    Frequency       Co-evaluation               AM-PAC PT "6 Clicks" Mobility  Outcome Measure Help needed turning from your back to your side while in a flat bed without using bedrails?: None Help needed moving from lying on your back to sitting on the side of a flat bed without using bedrails?: None Help needed moving to and from a bed to a chair (including a wheelchair)?: None Help needed standing up from a chair using your arms (e.g., wheelchair or bedside chair)?: None Help needed to walk in hospital room?: Total Help needed climbing 3-5 steps with a railing? : Total 6 Click Score: 18    End of Session   Activity Tolerance: Patient tolerated treatment well Patient left: in chair;with call bell/phone within reach Nurse Communication: Mobility status PT Visit Diagnosis: Muscle weakness (generalized) (M62.81)    Time: 3790-2409 PT Time Calculation (min) (ACUTE ONLY): 12 min   Charges:   PT Evaluation $PT Eval Low Complexity: 1 Low          Adeyemi Hamad M,PT Acute Rehab Services [phone]   Alvira Philips 08/27/2021, 1:22 PM

## 2021-08-27 NOTE — Plan of Care (Signed)
  Problem: Skin Integrity: Goal: Risk for impaired skin integrity will decrease Outcome: Progressing   Problem: Tissue Perfusion: Goal: Adequacy of tissue perfusion will improve Outcome: Progressing   

## 2021-08-28 ENCOUNTER — Other Ambulatory Visit (HOSPITAL_COMMUNITY): Payer: Self-pay

## 2021-08-28 LAB — GLUCOSE, CAPILLARY
Glucose-Capillary: 182 mg/dL — ABNORMAL HIGH (ref 70–99)
Glucose-Capillary: 221 mg/dL — ABNORMAL HIGH (ref 70–99)
Glucose-Capillary: 107 mg/dL — ABNORMAL HIGH (ref 70–99)

## 2021-08-28 LAB — CBC
HCT: 23.4 % — ABNORMAL LOW (ref 36.0–46.0)
Hemoglobin: 7.6 g/dL — ABNORMAL LOW (ref 12.0–15.0)
MCH: 31.9 pg (ref 26.0–34.0)
MCHC: 32.5 g/dL (ref 30.0–36.0)
MCV: 98.3 fL (ref 80.0–100.0)
Platelets: 296 10*3/uL (ref 150–400)
RBC: 2.38 MIL/uL — ABNORMAL LOW (ref 3.87–5.11)
RDW: 12.9 % (ref 11.5–15.5)
WBC: 10.3 10*3/uL (ref 4.0–10.5)
nRBC: 0 % (ref 0.0–0.2)

## 2021-08-28 LAB — BASIC METABOLIC PANEL
Anion gap: 12 (ref 5–15)
BUN: 33 mg/dL — ABNORMAL HIGH (ref 6–20)
CO2: 18 mmol/L — ABNORMAL LOW (ref 22–32)
Calcium: 8.4 mg/dL — ABNORMAL LOW (ref 8.9–10.3)
Chloride: 105 mmol/L (ref 98–111)
Creatinine, Ser: 4.58 mg/dL — ABNORMAL HIGH (ref 0.44–1.00)
GFR, Estimated: 11 mL/min — ABNORMAL LOW (ref >60)
Glucose, Bld: 224 mg/dL — ABNORMAL HIGH (ref 70–99)
Potassium: 3.4 mmol/L — ABNORMAL LOW (ref 3.5–5.1)
Sodium: 135 mmol/L (ref 135–145)

## 2021-08-28 MED ORDER — LANTUS SOLOSTAR 100 UNIT/ML ~~LOC~~ SOPN
35.0000 [IU] | PEN_INJECTOR | Freq: Every day | SUBCUTANEOUS | 0 refills | Status: DC
  Filled 2021-08-28: qty 15, 42d supply, fill #0

## 2021-08-28 MED ORDER — METHOCARBAMOL 750 MG PO TABS
750.0000 mg | ORAL_TABLET | Freq: Three times a day (TID) | ORAL | 0 refills | Status: DC
  Filled 2021-08-28: qty 30, 10d supply, fill #0

## 2021-08-28 MED ORDER — LANTUS SOLOSTAR 100 UNIT/ML ~~LOC~~ SOPN: 35.0000 [IU] | PEN_INJECTOR | Freq: Every day | SUBCUTANEOUS | 0 refills | Status: DC

## 2021-08-28 MED ORDER — INSULIN LISPRO (1 UNIT DIAL) 100 UNIT/ML (KWIKPEN)
7.0000 [IU] | PEN_INJECTOR | Freq: Three times a day (TID) | SUBCUTANEOUS | 0 refills | Status: DC
  Filled 2021-08-28: qty 15, 72d supply, fill #0

## 2021-08-28 MED ORDER — SIMETHICONE 80 MG PO CHEW
80.0000 mg | CHEWABLE_TABLET | Freq: Four times a day (QID) | ORAL | 0 refills | Status: DC | PRN
  Filled 2021-08-28: qty 30, 8d supply, fill #0

## 2021-08-28 MED ORDER — POTASSIUM CHLORIDE CRYS ER 20 MEQ PO TBCR
20.0000 meq | EXTENDED_RELEASE_TABLET | Freq: Once | ORAL | Status: AC
Start: 2021-08-28 — End: 2021-08-28
  Administered 2021-08-28: 20 meq via ORAL
  Filled 2021-08-28: qty 1

## 2021-08-28 MED ORDER — NOVOLOG FLEXPEN 100 UNIT/ML ~~LOC~~ SOPN: 7.0000 [IU] | PEN_INJECTOR | Freq: Three times a day (TID) | SUBCUTANEOUS | 0 refills | Status: DC

## 2021-08-28 NOTE — Progress Notes (Signed)
Pt discharged to home, bed at home.  Pt has all meds from TOC, DC instructions and verbalizes all home self care, no questions about home self care. Ride is downstairs waiting on pt.

## 2021-08-29 IMAGING — CR DG CHEST 2V
2 series · 2 of 2 positions shown · non-contrast
Comparison: 07/02/2019

CLINICAL DATA: Chest pain

EXAM:
CHEST - 2 VIEW

[w chest lat]
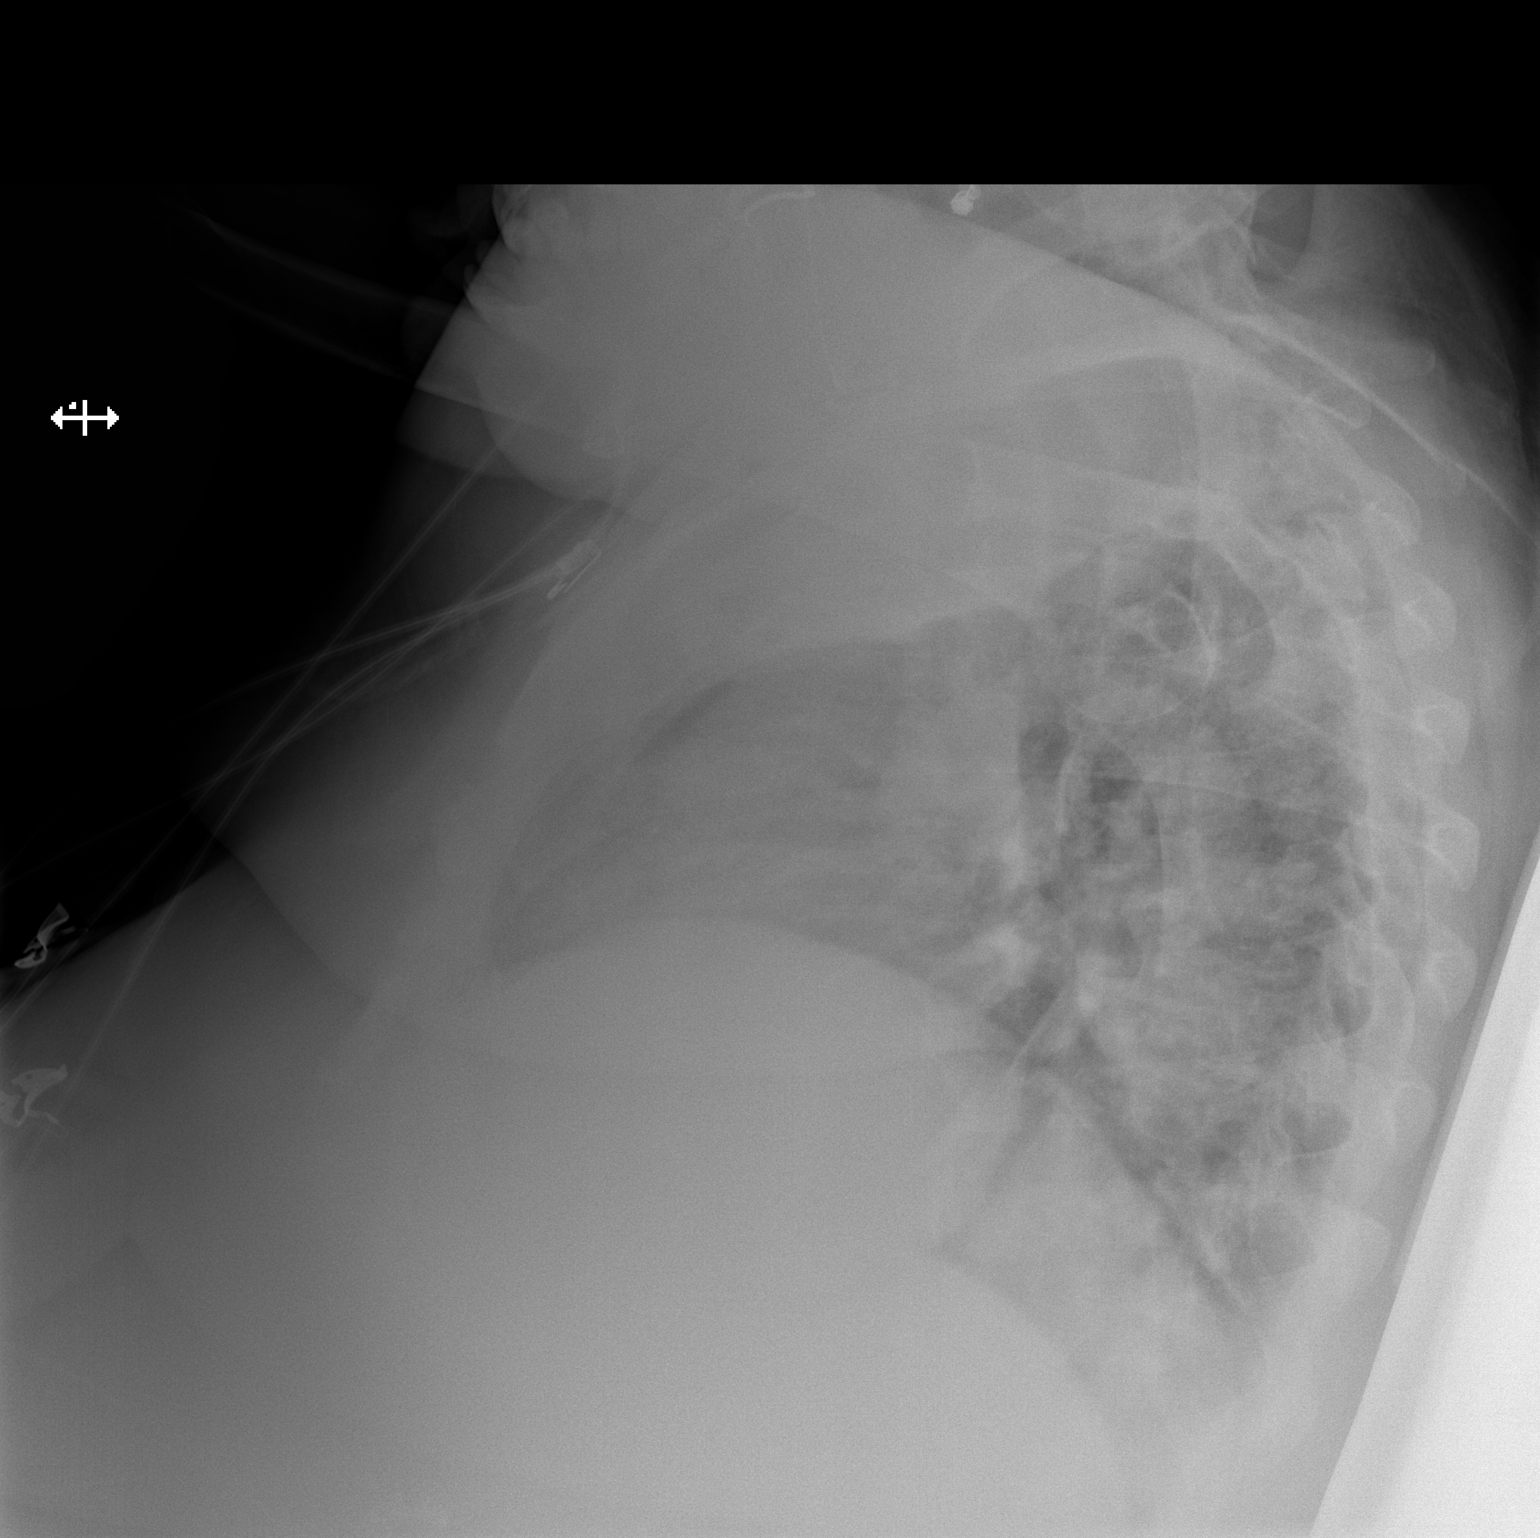

[x chest ap]
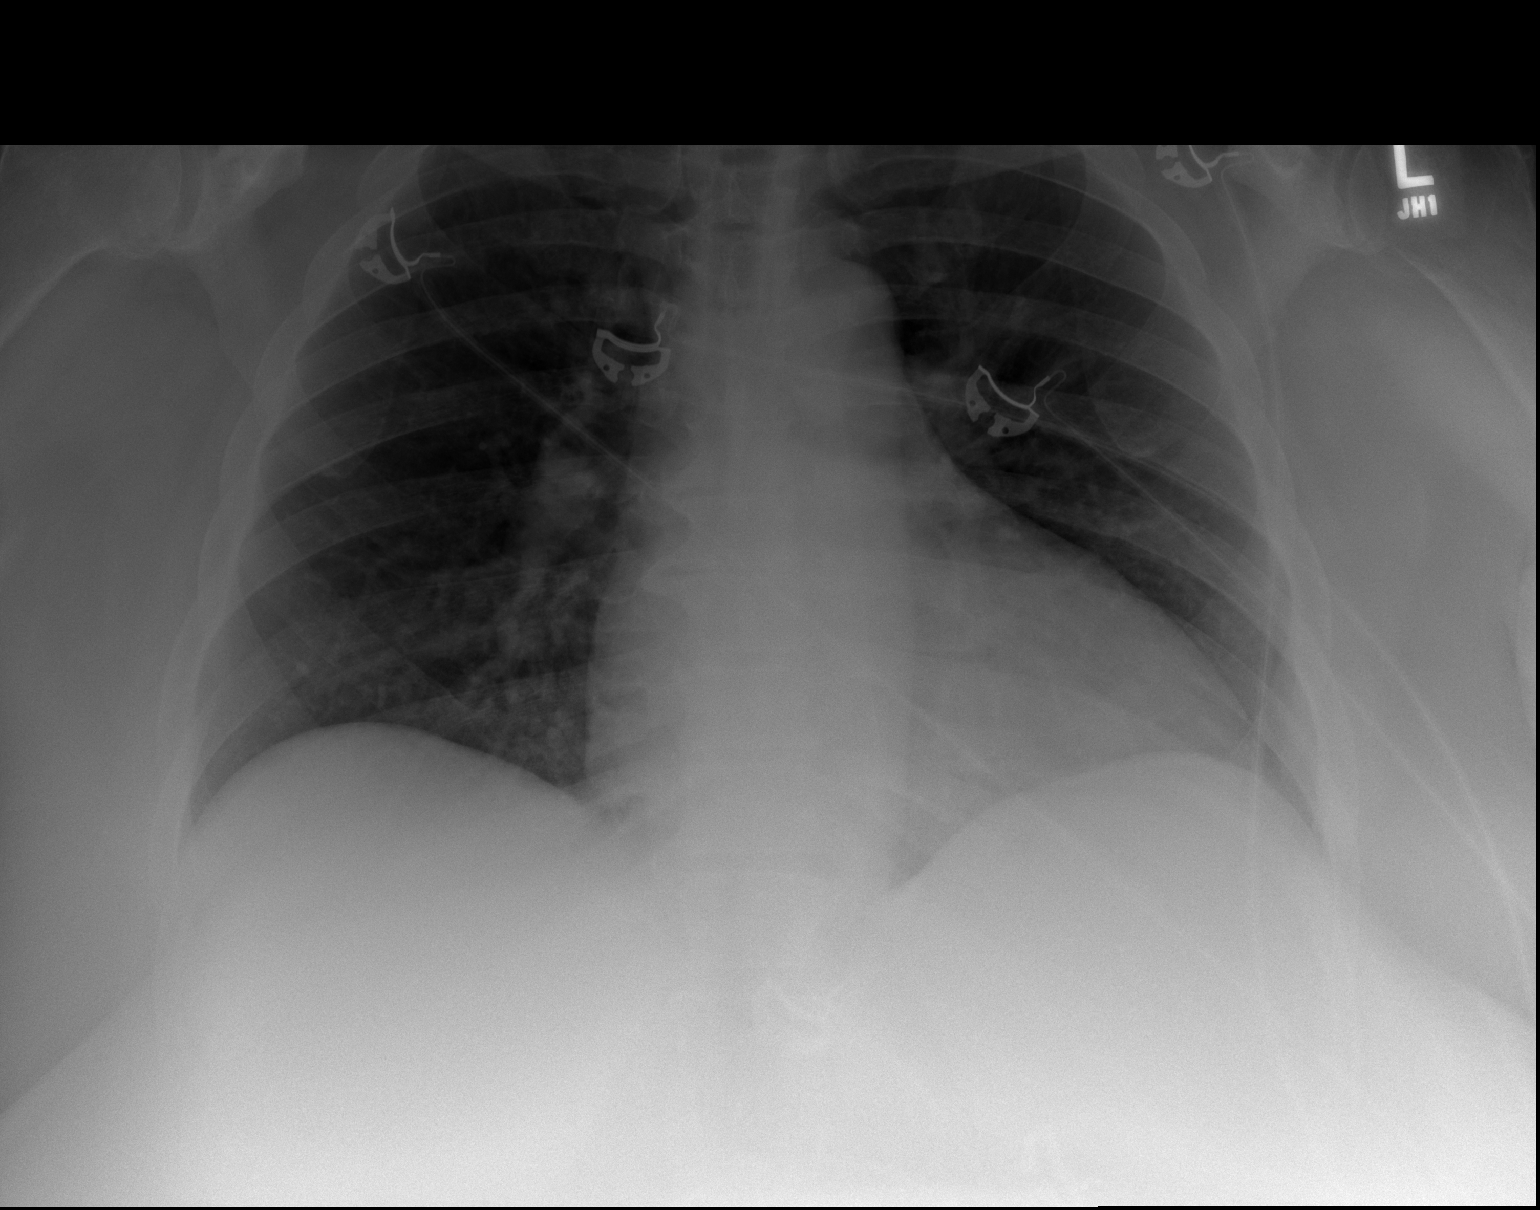

[2 of 2 positions shown; findings below may reference images not displayed]

FINDINGS: Mild cardiomegaly. No focal opacity or pleural effusion. No
pneumothorax.
IMPRESSION: Mild cardiomegaly. No edema or infiltrate.

## 2021-08-30 IMAGING — CT CT ABD-PELV W/O CM
2 of 4 series · 17 of 46 positions shown, 19 images · non-contrast
Comparison: CT abdomen dated 07/10/2019.

CLINICAL DATA: Diffuse abdominal pain, nausea and vomiting.

EXAM:
CT ABDOMEN AND PELVIS WITHOUT CONTRAST
TECHNIQUE: Multidetector CT imaging of the abdomen and pelvis was performed
following the standard protocol without IV contrast.

[Series 2: axial st · axial · 0.88mm/px · z∈[-516,-46]mm · 14 of 108 slices shown, 16 images]
[im 7/108  soft-tissue]
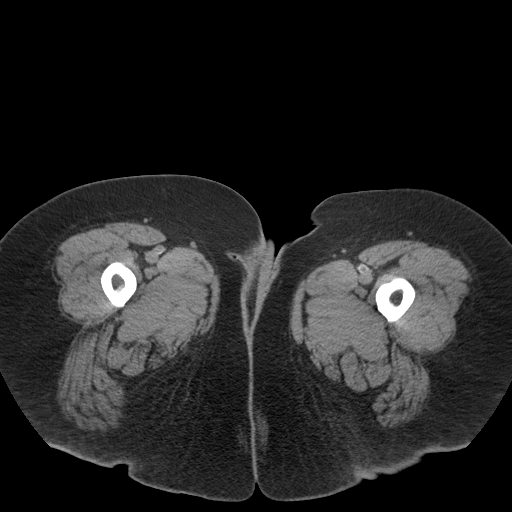
[im 7/108  bone]
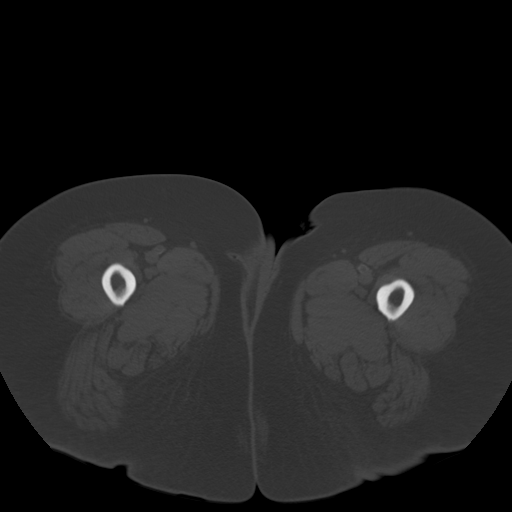
[im 13/108  soft-tissue]
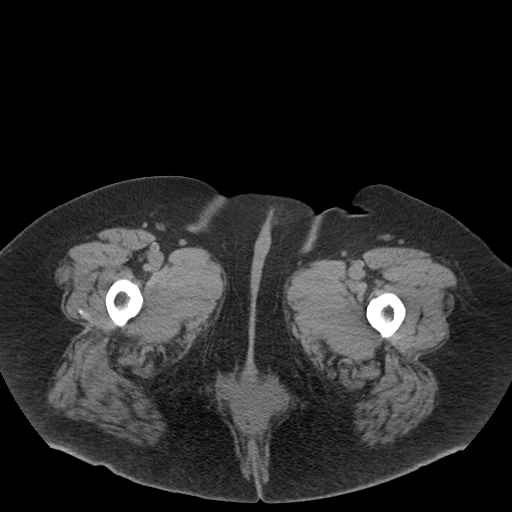
[im 19/108  soft-tissue]
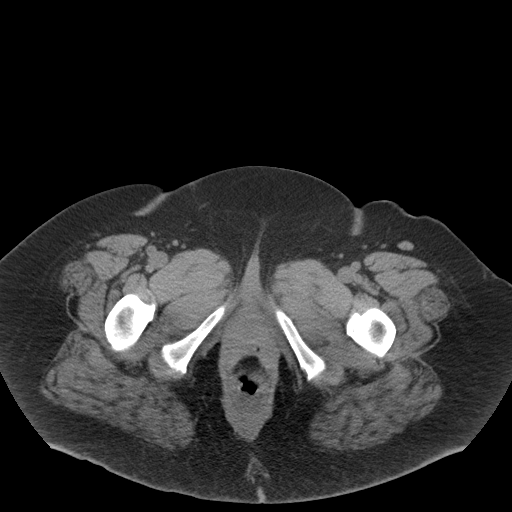
[im 32/108  soft-tissue]
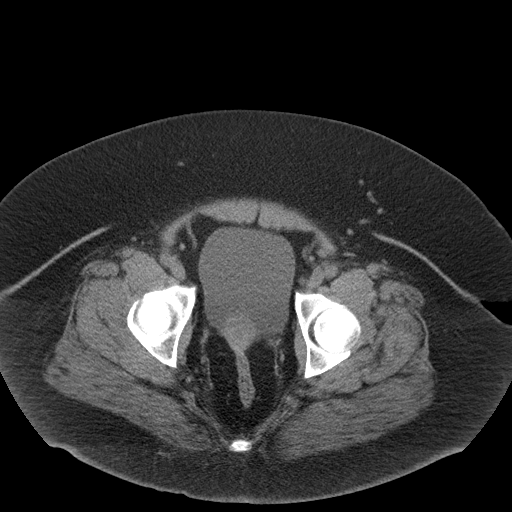
[im 38/108  soft-tissue]
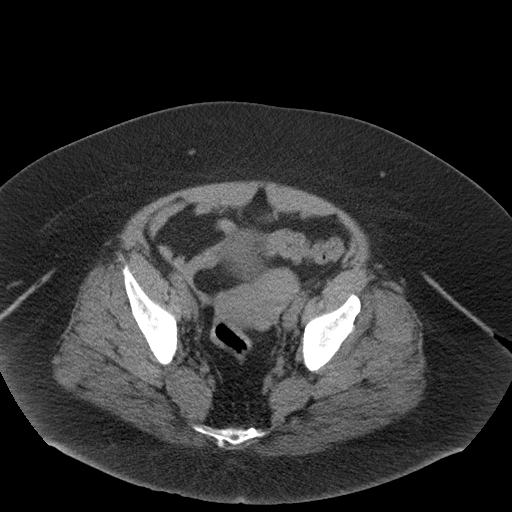
[im 45/108  soft-tissue]
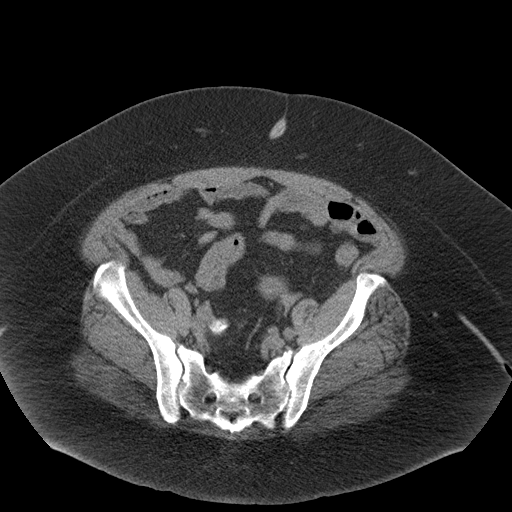
[im 51/108  soft-tissue]
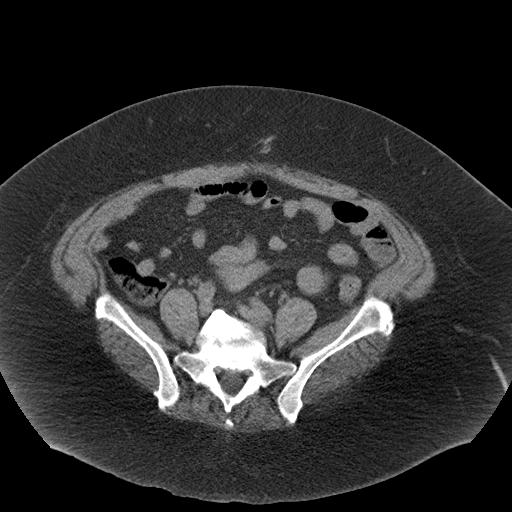
[im 57/108  soft-tissue]
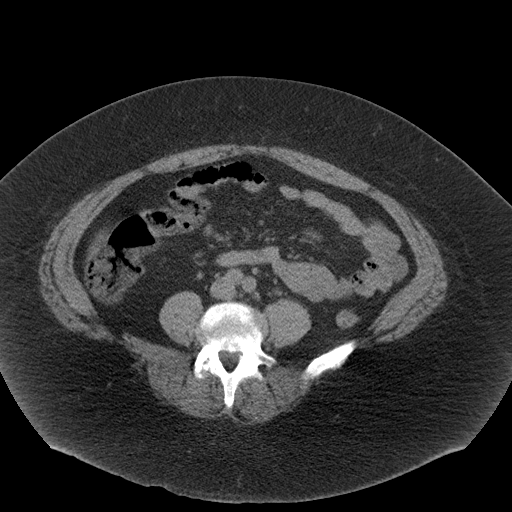
[im 63/108  soft-tissue]
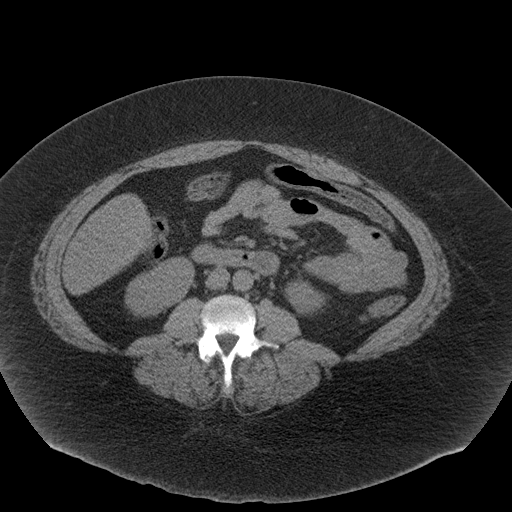
[im 63/108  bone]
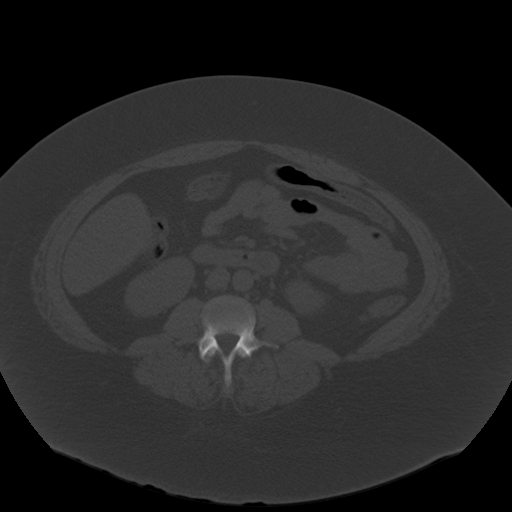
[im 70/108  soft-tissue]
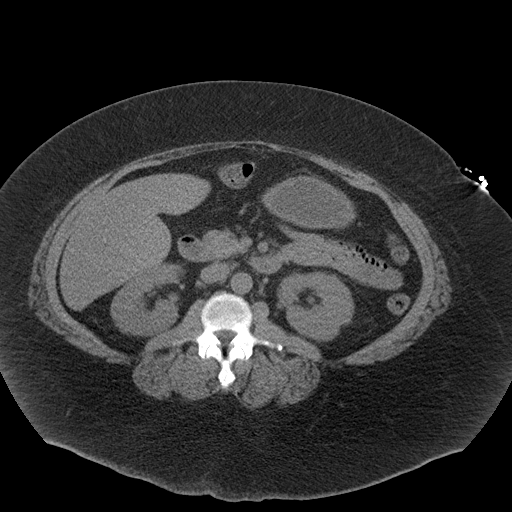
[im 82/108  soft-tissue]
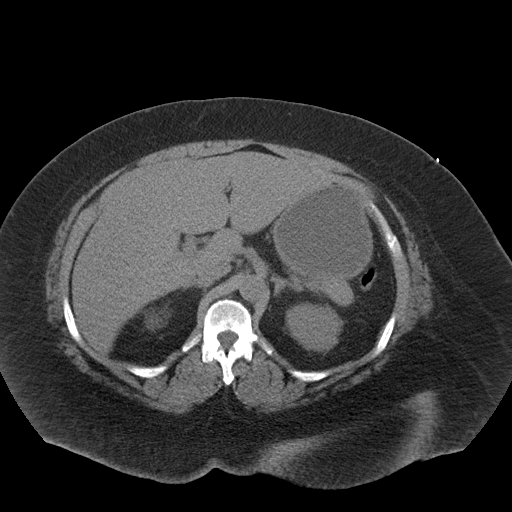
[im 89/108  soft-tissue]
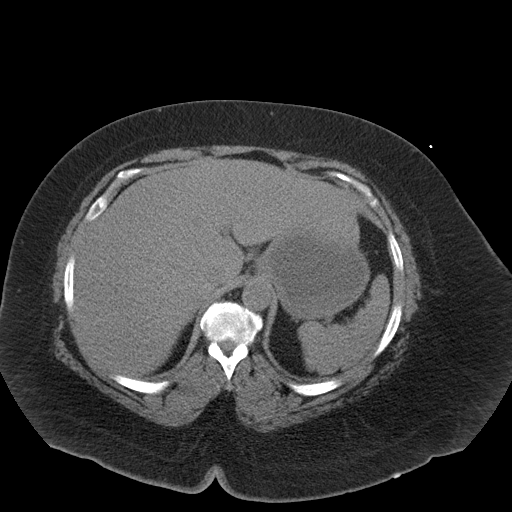
[im 95/108  soft-tissue]
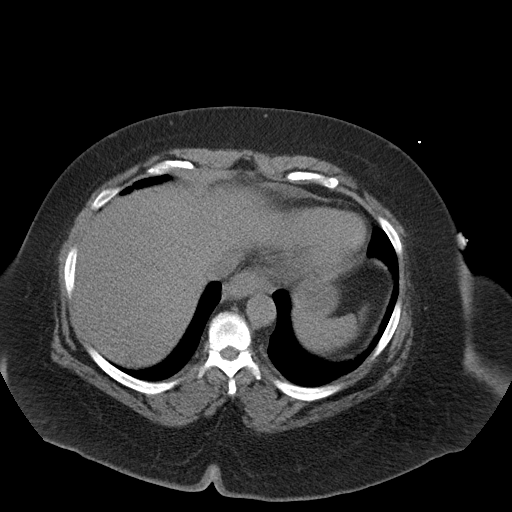
[im 101/108  soft-tissue]
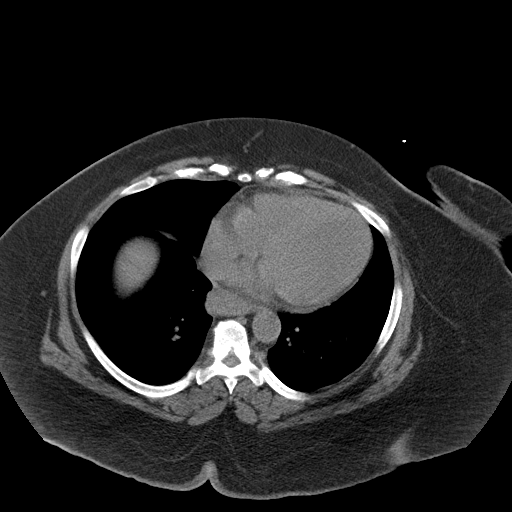

[Series 4: coronal st · coronal · 1.11mm/px · 3 of 175 slices shown]
[im 59/175  soft-tissue]
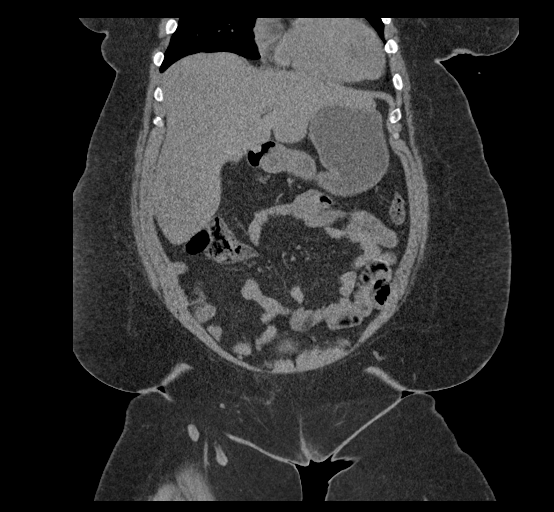
[im 78/175  soft-tissue]
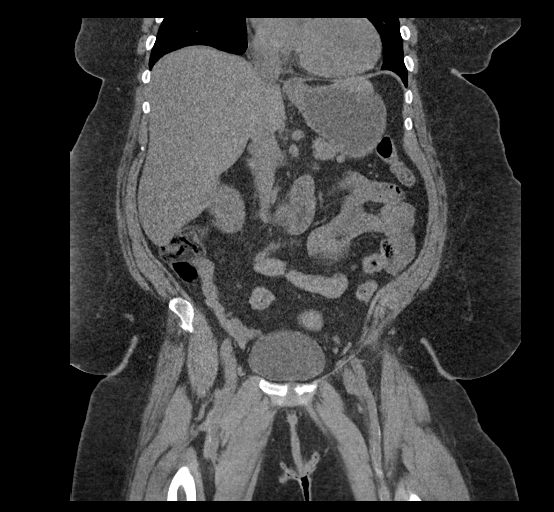
[im 97/175  soft-tissue]
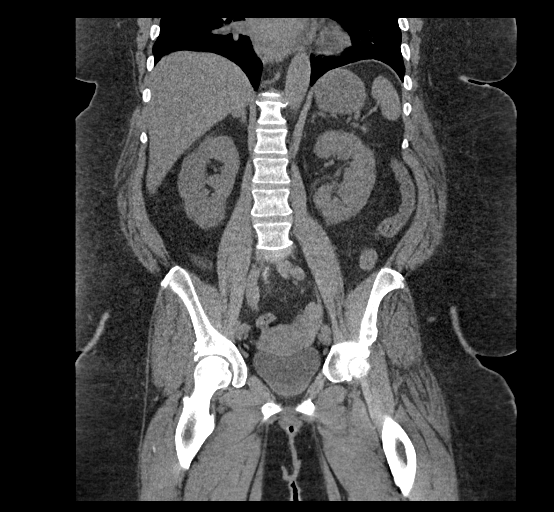

[17 of 46 positions shown; findings below may reference images not displayed]

FINDINGS: Lower chest: No acute abnormality.

Hepatobiliary: No focal liver abnormality is seen. Status post
cholecystectomy. No biliary dilatation.

Pancreas: Unremarkable. No pancreatic ductal dilatation or
surrounding inflammatory changes.

Spleen: Normal in size without focal abnormality.

Adrenals/Urinary Tract: Adrenal glands appear normal. Kidneys are
unremarkable without mass, stone or hydronephrosis. No ureteral or
bladder calculi. Bladder appears normal.

Stomach/Bowel: No dilated large or small bowel loops. No evidence of
bowel wall inflammation. Appendix is normal. Stomach is
unremarkable.

Vascular/Lymphatic: No significant vascular findings are present. No
enlarged abdominal or pelvic lymph nodes.

Reproductive: Uterine fibroid measures 5 cm. No acute findings.
Again noted is some degree of pelvic floor laxity.

Other: No free fluid or abscess collection. No free intraperitoneal
air.

Musculoskeletal: Degenerative spondylosis of the lower lumbar spine,
mild to moderate in degree. No acute or suspicious osseous finding.
IMPRESSION: 1. No acute findings within the abdomen or pelvis. No bowel
obstruction or evidence of bowel wall inflammation. No evidence of
acute solid organ abnormality. No renal or ureteral calculi.
Appendix is normal.
2. Uterine fibroid measures 5 cm.
3. Degenerative spondylosis of the lower lumbar spine, mild to
moderate in degree.

## 2021-09-04 ENCOUNTER — Other Ambulatory Visit: Payer: Self-pay | Admitting: Physical Medicine and Rehabilitation

## 2021-09-04 ENCOUNTER — Telehealth: Payer: Self-pay

## 2021-09-04 NOTE — Telephone Encounter (Signed)
Please advise [name] NP is out of the office.  Johny Chess called to report: She had  a bad pain episodes last night. And she was advised by [name] to call when that happens. To see if she can take an extra pain  medication dose. Please advise.   Call back phone [phone]

## 2021-09-07 NOTE — Telephone Encounter (Signed)
Message left for call back. No voice ID.

## 2021-09-07 NOTE — Telephone Encounter (Signed)
[facility] called back. I attempted to call her back twice.  Call went to non-identified voicemail.

## 2021-09-09 ENCOUNTER — Telehealth: Payer: Self-pay | Admitting: Registered Nurse

## 2021-09-09 NOTE — Telephone Encounter (Signed)
Patient states she is having really bad pains asked if she may take additional pills to help

## 2021-09-10 ENCOUNTER — Encounter (HOSPITAL_COMMUNITY): Payer: Self-pay | Admitting: Emergency Medicine

## 2021-09-10 ENCOUNTER — Other Ambulatory Visit: Payer: Self-pay

## 2021-09-10 ENCOUNTER — Observation Stay (HOSPITAL_COMMUNITY): Payer: Medicare Other

## 2021-09-10 ENCOUNTER — Inpatient Hospital Stay (HOSPITAL_COMMUNITY)
Admission: EM | Admit: 2021-09-10 | Discharge: 2021-09-15 | DRG: 074 | Disposition: A | Discharge status: Home / Self Care | Payer: Medicare Other | Attending: Internal Medicine | Admitting: Internal Medicine

## 2021-09-10 ENCOUNTER — Emergency Department (HOSPITAL_COMMUNITY): Payer: Medicare Other

## 2021-09-10 DIAGNOSIS — I5042 Chronic combined systolic (congestive) and diastolic (congestive) heart failure: Secondary | ICD-10-CM | POA: Diagnosis present

## 2021-09-10 DIAGNOSIS — R109 Unspecified abdominal pain: Secondary | ICD-10-CM | POA: Diagnosis present

## 2021-09-10 DIAGNOSIS — F419 Anxiety disorder, unspecified: Secondary | ICD-10-CM | POA: Diagnosis present

## 2021-09-10 DIAGNOSIS — K3184 Gastroparesis: Secondary | ICD-10-CM | POA: Diagnosis present

## 2021-09-10 DIAGNOSIS — E876 Hypokalemia: Secondary | ICD-10-CM | POA: Diagnosis present

## 2021-09-10 DIAGNOSIS — R0789 Other chest pain: Secondary | ICD-10-CM | POA: Diagnosis not present

## 2021-09-10 DIAGNOSIS — Z8673 Personal history of transient ischemic attack (TIA), and cerebral infarction without residual deficits: Secondary | ICD-10-CM

## 2021-09-10 DIAGNOSIS — Z87892 Personal history of anaphylaxis: Secondary | ICD-10-CM

## 2021-09-10 DIAGNOSIS — R072 Precordial pain: Secondary | ICD-10-CM | POA: Diagnosis present

## 2021-09-10 DIAGNOSIS — M797 Fibromyalgia: Secondary | ICD-10-CM | POA: Diagnosis present

## 2021-09-10 DIAGNOSIS — Z885 Allergy status to narcotic agent status: Secondary | ICD-10-CM

## 2021-09-10 DIAGNOSIS — E1143 Type 2 diabetes mellitus with diabetic autonomic (poly)neuropathy: Secondary | ICD-10-CM | POA: Diagnosis not present

## 2021-09-10 DIAGNOSIS — Z87440 Personal history of urinary (tract) infections: Secondary | ICD-10-CM

## 2021-09-10 DIAGNOSIS — Z794 Long term (current) use of insulin: Secondary | ICD-10-CM

## 2021-09-10 DIAGNOSIS — D631 Anemia in chronic kidney disease: Secondary | ICD-10-CM | POA: Diagnosis present

## 2021-09-10 DIAGNOSIS — R778 Other specified abnormalities of plasma proteins: Secondary | ICD-10-CM

## 2021-09-10 DIAGNOSIS — G8929 Other chronic pain: Secondary | ICD-10-CM | POA: Diagnosis present

## 2021-09-10 DIAGNOSIS — Z79899 Other long term (current) drug therapy: Secondary | ICD-10-CM

## 2021-09-10 DIAGNOSIS — R079 Chest pain, unspecified: Principal | ICD-10-CM

## 2021-09-10 DIAGNOSIS — I10 Essential (primary) hypertension: Secondary | ICD-10-CM | POA: Diagnosis present

## 2021-09-10 DIAGNOSIS — E1165 Type 2 diabetes mellitus with hyperglycemia: Secondary | ICD-10-CM | POA: Diagnosis present

## 2021-09-10 DIAGNOSIS — N184 Chronic kidney disease, stage 4 (severe): Secondary | ICD-10-CM | POA: Diagnosis present

## 2021-09-10 DIAGNOSIS — E1122 Type 2 diabetes mellitus with diabetic chronic kidney disease: Secondary | ICD-10-CM | POA: Diagnosis present

## 2021-09-10 DIAGNOSIS — Z91041 Radiographic dye allergy status: Secondary | ICD-10-CM

## 2021-09-10 DIAGNOSIS — Z8249 Family history of ischemic heart disease and other diseases of the circulatory system: Secondary | ICD-10-CM

## 2021-09-10 DIAGNOSIS — E119 Type 2 diabetes mellitus without complications: Secondary | ICD-10-CM

## 2021-09-10 DIAGNOSIS — Z7901 Long term (current) use of anticoagulants: Secondary | ICD-10-CM

## 2021-09-10 DIAGNOSIS — Z8711 Personal history of peptic ulcer disease: Secondary | ICD-10-CM

## 2021-09-10 DIAGNOSIS — K5900 Constipation, unspecified: Secondary | ICD-10-CM | POA: Diagnosis present

## 2021-09-10 DIAGNOSIS — Z880 Allergy status to penicillin: Secondary | ICD-10-CM

## 2021-09-10 DIAGNOSIS — Z833 Family history of diabetes mellitus: Secondary | ICD-10-CM

## 2021-09-10 DIAGNOSIS — Z886 Allergy status to analgesic agent status: Secondary | ICD-10-CM

## 2021-09-10 DIAGNOSIS — I130 Hypertensive heart and chronic kidney disease with heart failure and stage 1 through stage 4 chronic kidney disease, or unspecified chronic kidney disease: Secondary | ICD-10-CM | POA: Diagnosis present

## 2021-09-10 DIAGNOSIS — Z993 Dependence on wheelchair: Secondary | ICD-10-CM

## 2021-09-10 DIAGNOSIS — E785 Hyperlipidemia, unspecified: Secondary | ICD-10-CM | POA: Diagnosis present

## 2021-09-10 DIAGNOSIS — I252 Old myocardial infarction: Secondary | ICD-10-CM

## 2021-09-10 DIAGNOSIS — I480 Paroxysmal atrial fibrillation: Secondary | ICD-10-CM | POA: Diagnosis present

## 2021-09-10 DIAGNOSIS — Z888 Allergy status to other drugs, medicaments and biological substances status: Secondary | ICD-10-CM

## 2021-09-10 DIAGNOSIS — M4800 Spinal stenosis, site unspecified: Secondary | ICD-10-CM | POA: Diagnosis present

## 2021-09-10 HISTORY — DX: Other chest pain: R07.89

## 2021-09-10 LAB — CBC WITH DIFFERENTIAL/PLATELET
Abs Immature Granulocytes: 0.03 10*3/uL (ref 0.00–0.07)
Basophils Absolute: 0.1 10*3/uL (ref 0.0–0.1)
Basophils Relative: 0 %
Eosinophils Absolute: 0.2 10*3/uL (ref 0.0–0.5)
Eosinophils Relative: 2 %
HCT: 25.3 % — ABNORMAL LOW (ref 36.0–46.0)
Hemoglobin: 7.9 g/dL — ABNORMAL LOW (ref 12.0–15.0)
Immature Granulocytes: 0 %
Lymphocytes Relative: 27 %
Lymphs Abs: 3.1 10*3/uL (ref 0.7–4.0)
MCH: 30.5 pg (ref 26.0–34.0)
MCHC: 31.2 g/dL (ref 30.0–36.0)
MCV: 97.7 fL (ref 80.0–100.0)
Monocytes Absolute: 0.6 10*3/uL (ref 0.1–1.0)
Monocytes Relative: 5 %
Neutro Abs: 7.4 10*3/uL (ref 1.7–7.7)
Neutrophils Relative %: 66 %
Platelets: 448 10*3/uL — ABNORMAL HIGH (ref 150–400)
RBC: 2.59 MIL/uL — ABNORMAL LOW (ref 3.87–5.11)
RDW: 13.5 % (ref 11.5–15.5)
WBC: 11.4 10*3/uL — ABNORMAL HIGH (ref 4.0–10.5)
nRBC: 0 % (ref 0.0–0.2)

## 2021-09-10 LAB — COMPREHENSIVE METABOLIC PANEL
ALT: 10 U/L (ref 0–44)
AST: 13 U/L — ABNORMAL LOW (ref 15–41)
Albumin: 3.5 g/dL (ref 3.5–5.0)
Alkaline Phosphatase: 77 U/L (ref 38–126)
Anion gap: 15 (ref 5–15)
BUN: 29 mg/dL — ABNORMAL HIGH (ref 6–20)
CO2: 19 mmol/L — ABNORMAL LOW (ref 22–32)
Calcium: 8.4 mg/dL — ABNORMAL LOW (ref 8.9–10.3)
Chloride: 106 mmol/L (ref 98–111)
Creatinine, Ser: 3.02 mg/dL — ABNORMAL HIGH (ref 0.44–1.00)
GFR, Estimated: 17 mL/min — ABNORMAL LOW (ref >60)
Glucose, Bld: 119 mg/dL — ABNORMAL HIGH (ref 70–99)
Potassium: 2.8 mmol/L — ABNORMAL LOW (ref 3.5–5.1)
Sodium: 140 mmol/L (ref 135–145)
Total Bilirubin: 1.1 mg/dL (ref 0.3–1.2)
Total Protein: 7.9 g/dL (ref 6.5–8.1)

## 2021-09-10 LAB — URINALYSIS, ROUTINE W REFLEX MICROSCOPIC
Bilirubin Urine: NEGATIVE
Glucose, UA: NEGATIVE mg/dL
Hgb urine dipstick: NEGATIVE
Ketones, ur: NEGATIVE mg/dL
Leukocytes,Ua: NEGATIVE
Nitrite: NEGATIVE
Protein, ur: 100 mg/dL — AB
Specific Gravity, Urine: 1.009 (ref 1.005–1.030)
pH: 5 (ref 5.0–8.0)

## 2021-09-10 LAB — LIPASE, BLOOD: Lipase: 29 U/L (ref 11–51)

## 2021-09-10 LAB — GLUCOSE, CAPILLARY: Glucose-Capillary: 199 mg/dL — ABNORMAL HIGH (ref 70–99)

## 2021-09-10 LAB — POTASSIUM: Potassium: 3.2 mmol/L — ABNORMAL LOW (ref 3.5–5.1)

## 2021-09-10 LAB — CBG MONITORING, ED: Glucose-Capillary: 196 mg/dL — ABNORMAL HIGH (ref 70–99)

## 2021-09-10 LAB — TROPONIN I (HIGH SENSITIVITY)
Troponin I (High Sensitivity): 33 ng/L — ABNORMAL HIGH (ref <18)
Troponin I (High Sensitivity): 29 ng/L — ABNORMAL HIGH (ref <18)

## 2021-09-10 LAB — MAGNESIUM: Magnesium: 1.1 mg/dL — ABNORMAL LOW (ref 1.7–2.4)

## 2021-09-10 LAB — LACTIC ACID, PLASMA: Lactic Acid, Venous: 1.2 mmol/L (ref 0.5–1.9)

## 2021-09-10 MED ORDER — FAMOTIDINE 20 MG PO TABS
40.0000 mg | ORAL_TABLET | Freq: Every day | ORAL | Status: DC
  Administered 2021-09-11 – 2021-09-12 (×2): 40 mg via ORAL
  Filled 2021-09-10 (×3): qty 2

## 2021-09-10 MED ORDER — PHENOL 1.4 % MT LIQD
1.0000 | OROMUCOSAL | Status: DC | PRN
Start: 2021-09-10 — End: 2021-09-14
  Filled 2021-09-10: qty 177

## 2021-09-10 MED ORDER — POTASSIUM CHLORIDE CRYS ER 20 MEQ PO TBCR
40.0000 meq | EXTENDED_RELEASE_TABLET | Freq: Two times a day (BID) | ORAL | Status: AC
  Filled 2021-09-10 (×3): qty 2

## 2021-09-10 MED ORDER — LIDOCAINE 5 % EX PTCH
1.0000 | MEDICATED_PATCH | CUTANEOUS | Status: DC
  Administered 2021-09-10: 1 via TRANSDERMAL
  Filled 2021-09-10 (×2): qty 1

## 2021-09-10 MED ORDER — HYDROMORPHONE HCL 2 MG PO TABS
4.0000 mg | ORAL_TABLET | Freq: Three times a day (TID) | ORAL | Status: DC | PRN
  Filled 2021-09-10: qty 2

## 2021-09-10 MED ORDER — LIDOCAINE VISCOUS HCL 2 % MT SOLN: 15.0000 mL | Freq: Once | OROMUCOSAL | Status: DC

## 2021-09-10 MED ORDER — METOPROLOL SUCCINATE ER 50 MG PO TB24
50.0000 mg | ORAL_TABLET | Freq: Every day | ORAL | Status: DC
  Administered 2021-09-11 – 2021-09-15 (×5): 50 mg via ORAL
  Filled 2021-09-10 (×5): qty 1

## 2021-09-10 MED ORDER — HYDROMORPHONE HCL 2 MG PO TABS
4.0000 mg | ORAL_TABLET | ORAL | Status: DC | PRN
  Administered 2021-09-10: 4 mg via ORAL
  Filled 2021-09-10 (×2): qty 2

## 2021-09-10 MED ORDER — SODIUM BICARBONATE 650 MG PO TABS
650.0000 mg | ORAL_TABLET | Freq: Two times a day (BID) | ORAL | Status: DC
  Administered 2021-09-11 – 2021-09-14 (×9): 650 mg via ORAL
  Filled 2021-09-10 (×12): qty 1

## 2021-09-10 MED ORDER — APIXABAN 5 MG PO TABS
5.0000 mg | ORAL_TABLET | Freq: Two times a day (BID) | ORAL | Status: DC
  Administered 2021-09-11 – 2021-09-15 (×10): 5 mg via ORAL
  Filled 2021-09-10 (×11): qty 1

## 2021-09-10 MED ORDER — DULOXETINE HCL 20 MG PO CPEP
40.0000 mg | ORAL_CAPSULE | Freq: Every day | ORAL | Status: DC
  Administered 2021-09-11 – 2021-09-15 (×5): 40 mg via ORAL
  Filled 2021-09-10 (×6): qty 2

## 2021-09-10 MED ORDER — MAGNESIUM SULFATE 2 GM/50ML IV SOLN
2.0000 g | Freq: Once | INTRAVENOUS | Status: AC
  Administered 2021-09-10: 2 g via INTRAVENOUS
  Filled 2021-09-10: qty 50

## 2021-09-10 MED ORDER — INSULIN ASPART 100 UNIT/ML IJ SOLN
0.0000 [IU] | Freq: Three times a day (TID) | INTRAMUSCULAR | Status: DC
  Administered 2021-09-10: 3 [IU] via SUBCUTANEOUS
  Administered 2021-09-11 (×2): 5 [IU] via SUBCUTANEOUS
  Administered 2021-09-11: 2 [IU] via SUBCUTANEOUS
  Administered 2021-09-12: 8 [IU] via SUBCUTANEOUS
  Administered 2021-09-12: 3 [IU] via SUBCUTANEOUS
  Administered 2021-09-12: 5 [IU] via SUBCUTANEOUS
  Administered 2021-09-13: 3 [IU] via SUBCUTANEOUS
  Administered 2021-09-13: 5 [IU] via SUBCUTANEOUS
  Administered 2021-09-13: 8 [IU] via SUBCUTANEOUS
  Administered 2021-09-14: 11 [IU] via SUBCUTANEOUS
  Administered 2021-09-14: 5 [IU] via SUBCUTANEOUS
  Administered 2021-09-14: 3 [IU] via SUBCUTANEOUS
  Administered 2021-09-15: 5 [IU] via SUBCUTANEOUS
  Administered 2021-09-15: 3 [IU] via SUBCUTANEOUS

## 2021-09-10 MED ORDER — POTASSIUM CHLORIDE 10 MEQ/100ML IV SOLN
10.0000 meq | INTRAVENOUS | Status: AC
  Administered 2021-09-10 (×2): 10 meq via INTRAVENOUS
  Filled 2021-09-10 (×2): qty 100

## 2021-09-10 MED ORDER — PROCHLORPERAZINE EDISYLATE 10 MG/2ML IJ SOLN
10.0000 mg | Freq: Four times a day (QID) | INTRAMUSCULAR | Status: DC | PRN
  Administered 2021-09-10 – 2021-09-12 (×4): 10 mg via INTRAVENOUS
  Filled 2021-09-10 (×4): qty 2

## 2021-09-10 MED ORDER — HYDROMORPHONE HCL 1 MG/ML IJ SOLN
1.0000 mg | Freq: Once | INTRAMUSCULAR | Status: AC
  Administered 2021-09-10: 1 mg via INTRAVENOUS
  Filled 2021-09-10: qty 1

## 2021-09-10 MED ORDER — SODIUM CHLORIDE 0.9 % IV SOLN: INTRAVENOUS | Status: DC | PRN

## 2021-09-10 MED ORDER — ENOXAPARIN SODIUM 30 MG/0.3ML IJ SOSY: 30.0000 mg | PREFILLED_SYRINGE | INTRAMUSCULAR | Status: DC

## 2021-09-10 MED ORDER — POTASSIUM CHLORIDE 10 MEQ/100ML IV SOLN
10.0000 meq | INTRAVENOUS | Status: AC
  Administered 2021-09-10 – 2021-09-11 (×3): 10 meq via INTRAVENOUS
  Filled 2021-09-10 (×3): qty 100

## 2021-09-10 MED ORDER — POTASSIUM CHLORIDE 10 MEQ/100ML IV SOLN: 10.0000 meq | INTRAVENOUS | Status: DC

## 2021-09-10 MED ORDER — LIDOCAINE VISCOUS HCL 2 % MT SOLN
15.0000 mL | Freq: Once | OROMUCOSAL | Status: AC
  Administered 2021-09-10: 15 mL via ORAL
  Filled 2021-09-10: qty 15

## 2021-09-10 MED ORDER — ALBUTEROL SULFATE (2.5 MG/3ML) 0.083% IN NEBU: 2.5000 mg | INHALATION_SOLUTION | Freq: Four times a day (QID) | RESPIRATORY_TRACT | Status: DC | PRN

## 2021-09-10 MED ORDER — MORPHINE SULFATE 15 MG PO TABS
15.0000 mg | ORAL_TABLET | Freq: Once | ORAL | Status: AC
  Administered 2021-09-10: 15 mg via ORAL
  Filled 2021-09-10: qty 1

## 2021-09-10 MED ORDER — ALUM & MAG HYDROXIDE-SIMETH 200-200-20 MG/5ML PO SUSP
30.0000 mL | Freq: Once | ORAL | Status: AC
  Administered 2021-09-10: 30 mL via ORAL
  Filled 2021-09-10: qty 30

## 2021-09-10 MED ORDER — INSULIN GLARGINE-YFGN 100 UNIT/ML ~~LOC~~ SOLN
15.0000 [IU] | Freq: Every day | SUBCUTANEOUS | Status: DC
  Administered 2021-09-10 – 2021-09-11 (×2): 15 [IU] via SUBCUTANEOUS
  Filled 2021-09-10 (×4): qty 0.15

## 2021-09-10 MED ORDER — ALUM & MAG HYDROXIDE-SIMETH 200-200-20 MG/5ML PO SUSP: 30.0000 mL | Freq: Once | ORAL | Status: DC

## 2021-09-10 MED ORDER — PANTOPRAZOLE SODIUM 40 MG PO TBEC
40.0000 mg | DELAYED_RELEASE_TABLET | Freq: Two times a day (BID) | ORAL | Status: DC
  Administered 2021-09-10 – 2021-09-15 (×10): 40 mg via ORAL
  Filled 2021-09-10 (×11): qty 1

## 2021-09-10 MED ORDER — FOLIC ACID 1 MG PO TABS
1.0000 mg | ORAL_TABLET | Freq: Every day | ORAL | Status: DC
Start: 2021-09-10 — End: 2021-09-10
  Filled 2021-09-10: qty 1

## 2021-09-10 MED ORDER — HYDROMORPHONE HCL 1 MG/ML IJ SOLN
0.5000 mg | Freq: Once | INTRAMUSCULAR | Status: AC | PRN
  Administered 2021-09-10: 0.5 mg via INTRAVENOUS
  Filled 2021-09-10: qty 1

## 2021-09-10 MED ORDER — POTASSIUM CHLORIDE 10 MEQ/100ML IV SOLN
INTRAVENOUS | Status: AC
  Administered 2021-09-10: 10 meq via INTRAVENOUS
  Filled 2021-09-10: qty 100

## 2021-09-10 MED ORDER — ACETAMINOPHEN 500 MG PO TABS: 1000.0000 mg | ORAL_TABLET | Freq: Once | ORAL | Status: DC

## 2021-09-10 MED ORDER — ATORVASTATIN CALCIUM 10 MG PO TABS
10.0000 mg | ORAL_TABLET | Freq: Every day | ORAL | Status: DC
  Administered 2021-09-11 – 2021-09-15 (×5): 10 mg via ORAL
  Filled 2021-09-10 (×5): qty 1

## 2021-09-10 NOTE — ED Notes (Signed)
Patient resting quietly in room.  

## 2021-09-10 NOTE — ED Provider Triage Note (Signed)
Emergency Medicine Provider Triage Evaluation Note  Deborah [name] , a 56 y.o. female  was evaluated in triage.  Pt complains of chest and abdominal pain that woke her from sleep.  She reports chest pain is described as a pressure and heaviness in the middle of her chest and radiating to the left side she also reports generalized abdominal pain worse in the lower abdomen associated with nausea and 3 episodes of vomiting.  No improvement in pain with nitro in route.  Allergic to aspirin.  History of prior MI.  Recent admission for UTI.  Review of Systems  Positive: Chest pain, abdominal pain, nausea, vomiting Negative: Fever, cough, syncope  Physical Exam  BP (!) 181/99 (BP Location: Right Wrist)   Pulse (!) 103   Temp 98.3 F (36.8 C) (Oral)   Resp (!) 22   LMP 10/10/2012   SpO2 98%  Gen:   Awake, appears uncomfortable Resp:  Normal effort, mildly tachycardic MSK:   Moves extremities without difficulty  Other:  Generalized abdominal pain without guarding.  Mouth movements consistent with tardive dyskinesia which patient reports prior history of  Medical Decision Making  Medically screening exam initiated at 3:17 AM.  Appropriate orders placed.  Deborah [name] was informed that the remainder of the evaluation will be completed by another provider, this initial triage assessment does not replace that evaluation, and the importance of remaining in the ED until their evaluation is complete.  EKG without STEMI.  Chest pain and abdominal pain labs and chest x-ray ordered from triage.   Jacqlyn Larsen, [place] 09/10/21 [phone]

## 2021-09-10 NOTE — ED Notes (Signed)
Pt hysterically crying out since being placed in room. This RN assessed pt. Pt stating she is experiencing 10/10 abdominal pain. Pt previously c/o CP. 1 hour ago '1mg'$  dilaudid given with minimal relief. EDP ordered and this RN administered another '1mg'$  of dilaudid at this time, will continue to assess.

## 2021-09-10 NOTE — ED Triage Notes (Signed)
Per EMS, pt from home, c/o awaken from sleep w/ substernal pain 10/10 described as pressure and tightness that radiates to the left side.  Pt also c/o lower abdominal pain.  158/76 Pulse 104 - 118 CBG 117 99% RA 1 nitro (no changes in BP or pain) '4mg'$  zofran 18G L AC

## 2021-09-10 NOTE — ED Notes (Signed)
Attempting improved IV/ second access, unsuccessful x3. Pt to CT.

## 2021-09-10 NOTE — ED Notes (Signed)
Declined taking any of her scheduled PO meds at this time b/c her throat hurts. Offered water and/or crushed meds. Will try again. Crying/tearful per (this) morning baseline.

## 2021-09-10 NOTE — H&P (Signed)
NAME:  Deborah [name], MRN:  [id number], DOB:  Sep 18, 1965, LOS: 0 ADMISSION DATE:  09/10/2021, Primary: Cena Benton, MD  CHIEF COMPLAINT:  chest pain   Medical Service: Internal Medicine Teaching Service         Attending Physician: Dr. Charise Killian, MD    First Contact: Dr. Simeon Craft Pager: [phone]  Second Contact: Dr. Allyson Sabal Pager: [phone]       After Hours (After 5p/  First Contact Pager: [phone]  weekends / holidays): Second Contact Pager: Severn is 56yo person with chronic abdominal pain 2/2 gastroparesis, type II diabetes mellitus, CKD IV, paroxysmal atrial fibrillation, chronic diastolic heart failure, recent admission for acute kidney injury 2/2 ATN/acute pyelonephritis presenting to [facility] for acute mid-sternal chest pain. History partially obtained via chart review, as patient was unable to provide much history due to her significant pain. Yesterday she began to have worsening abdominal pain. States this pain is worse than her usual chronic pain and is located primarily in bilateral lower quadrants. Denies any dietary changes, changes in bowel movements, dysuria, urinary frequency. Later in the day, she began to feel mid-sternal chest pain. Describes the pain as sharp, no radiation. She is unable to tell me if she is having any other symptoms.   PCP: Cena Benton, MD  ED COURSE   In ED, patient afebrile, hypertensive, tachycardic sating well on room air. Lab work revealed hypokalemia, improved renal function from last hospitalization, and mild leukocytosis. Troponin also mildly elevated, 33>29. IMTS subsequently called for admission.   PAST MEDICAL HISTORY   She,  has a past medical history of Acute back pain with sciatica, left, Acute back pain with sciatica, right, AKI (acute kidney injury) ([place]), Anemia, unspecified, Cancer ([place]), Carcinoid tumor of duodenum, Chest pain with normal coronary  angiography (2019), Chronic kidney disease, stage 3b ([place]), Chronic pain, Chronic systolic CHF (congestive heart failure) ([place]), Diabetes mellitus, DKA (diabetic ketoacidosis) ([place]), Drug-seeking behavior, Elevated troponin, Esophageal reflux, Fibromyalgia, Gastric ulcer, Gastroparesis, Gout, Hyperlipidemia, Hypertension, Hypokalemia, Hypomagnesemia, Lumbosacral stenosis, LVH (left ventricular hypertrophy), Morbid obesity ([place]), NICM (nonischemic cardiomyopathy) ([place]), PAF (paroxysmal atrial fibrillation) ([place]), Stroke ([place]) (02/2011), Thrombocytosis, and Vitamin B12 deficiency anemia.   HOME MEDICATIONS   Prior to Admission medications   Medication Sig Start Date End Date Taking? Authorizing Provider  albuterol (PROVENTIL) (2.5 MG/3ML) 0.083% nebulizer solution Take 3 mLs (2.5 mg total) by nebulization every 6 (six) hours as needed for wheezing or shortness of breath. 04/06/19   Scot Jun, FNP  atorvastatin (LIPITOR) 10 MG tablet TAKE ONE TABLET BY MOUTH ONCE DAILY (AM) Patient taking differently: Take 10 mg by mouth daily. 10/22/20   Alcus Dad, MD  blood glucose meter kit and supplies KIT Dispense based on patient and insurance preference. Use up to four times daily as directed. (FOR ICD-9 250.00, 250.01). 12/13/18   [name], MD  cetirizine (ZYRTEC) 10 MG tablet TAKE 1 TABLET (10 MG TOTAL) BY MOUTH DAILY (AM) Patient taking differently: Take 10 mg by mouth daily. 06/30/21   Raulkar, Clide Deutscher, MD  DULoxetine HCl 40 MG CPEP TAKE 40 MG BY MOUTH IN THE MORNING AND AT BEDTIME. 06/30/21   Raulkar, Clide Deutscher, MD  EASY COMFORT PEN NEEDLES 31G X 5 MM MISC USE 3 TIMES A DAY FOR INSULIN ADMINISTRATION 11/14/19   Meccariello, Mel Almond J, DO  ELIQUIS 5 MG TABS tablet TAKE 1 TABLET BY MOUTH 2 (TWO) TIMES DAILY. Patient taking differently:  Take 5 mg by mouth 2 (two) times daily. 12/20/19   Meccariello, Bernita Raisin, DO  famotidine (PEPCID) 40 MG tablet Take 40 mg by mouth daily. 04/03/21 04/03/22   [provider]  fluticasone (FLONASE) 50 MCG/ACT nasal spray Place 2 sprays into both nostrils daily as needed for allergies or rhinitis. Patient taking differently: Place 1 spray into both nostrils daily. 12/19/18   Rai, Vernelle Emerald, MD  folic acid (FOLVITE) 1 MG tablet Take 1 mg by mouth daily. 04/07/21   [provider]  hydrALAZINE (APRESOLINE) 50 MG tablet Take 50 mg by mouth 3 (three) times daily. 09/04/21   [provider]  HYDROmorphone (DILAUDID) 4 MG tablet Take 1 tablet (4 mg total) by mouth 2 (two) times daily. May take an extra tablet when pain is severe 10 days out of the month . Please fill on 07/31/2021 07/28/21   Bayard Hugger, NP  isosorbide mononitrate (IMDUR) 60 MG 24 hr tablet TAKE 1 TABLET (60 MG TOTAL) BY MOUTH DAILY (AM) 09/07/21   Raulkar, Clide Deutscher, MD  LANTUS SOLOSTAR 100 UNIT/ML Solostar Pen Inject 35 Units into the skin at bedtime. 08/28/21   Gaylan Gerold, DO  meclizine (ANTIVERT) 25 MG tablet Take 1 tablet (25 mg total) by mouth 3 (three) times daily as needed for dizziness. 02/20/21   Raulkar, Clide Deutscher, MD  methocarbamol (ROBAXIN) 750 MG tablet Take 1 tablet (750 mg total) by mouth every 8 (eight) hours. 08/28/21   Gaylan Gerold, DO  metoCLOPramide (REGLAN) 5 MG tablet TAKE 1 TABLET (5 MG TOTAL) BY MOUTH 4 TIMES DAILY. (AM+NOON+EVENING+BEDTIME) Patient taking differently: Take 5 mg by mouth 4 (four) times daily -  before meals and at bedtime. 07/28/21   Raulkar, Clide Deutscher, MD  metoprolol succinate (TOPROL-XL) 50 MG 24 hr tablet Take 1 tablet (50 mg total) by mouth daily. Take with or immediately following a meal. 11/24/20 08/15/21  Enzo Bi, MD  insulin lispro (HUMALOG) 100 UNIT/ML KwikPen Inject 7 Units into the skin 3 (three) times daily with meals. 08/28/21   Gaylan Gerold, DO  ondansetron (ZOFRAN ODT) 4 MG disintegrating tablet Take 1 tablet (4 mg total) by mouth every 8 (eight) hours as needed for nausea or vomiting. 05/30/21   Sherwood Gambler, MD   pantoprazole (PROTONIX) 40 MG tablet TAKE 1 TABLET (40 MG TOTAL) BY MOUTH 2 (TWO) TIMES DAILY. (AM+BEDTIME) 09/07/21   Raulkar, Clide Deutscher, MD  promethazine (PHENERGAN) 25 MG tablet Take 1 tablet (25 mg total) by mouth every 6 (six) hours as needed for nausea or vomiting. 05/30/21   Sherwood Gambler, MD  SENNA PLUS 8.6-50 MG tablet Take 1 tablet by mouth 2 (two) times daily. 05/19/21   [provider]  simethicone (MYLICON) 80 MG chewable tablet Chew 1 tablet (80 mg total) by mouth every 6 (six) hours as needed for up to 14 days for flatulence. 08/28/21 09/11/21  Gaylan Gerold, DO  sodium bicarbonate 650 MG tablet Take 1 tablet (650 mg total) by mouth 2 (two) times daily. 04/20/21   Jennye Boroughs, MD  spironolactone (ALDACTONE) 25 MG tablet Take 12.5 mg by mouth daily. 09/04/21   [provider]  torsemide (DEMADEX) 20 MG tablet Take 20 mg by mouth daily as needed. 09/04/21   [provider]  Vitamin D, Ergocalciferol, (DRISDOL) 1.25 MG (50000 UNIT) CAPS capsule Take 50,000 Units by mouth every Monday. 04/07/21   [provider]  NOVOLOG FLEXPEN 100 UNIT/ML FlexPen Inject 7 Units into the skin 3 (  three) times daily with meals. 08/28/21 08/28/21  Gaylan Gerold, DO    ALLERGIES   Allergies as of 09/10/2021 - Review Complete 09/10/2021  Allergen Reaction Noted   Diazepam [name] 09/21/2011   Gabapentin [name] and Swelling 03/27/2017   Iodinated contrast media Anaphylaxis 11/28/2017   Isovue [iopamidol] Anaphylaxis 11/28/2017   Lisinopril Anaphylaxis 09/21/2011   Metoclopramide Other (See Comments) 08/08/2019   Nsaids Anaphylaxis and Other (See Comments) 09/21/2015   Penicillins Palpitations 02/04/2011   Tolmetin Nausea Only and Other (See Comments) 09/21/2015   Cyclobenzaprine  05/17/2020   Rifamycins Other (See Comments) 07/25/2020   Acetaminophen Nausea Only and Other (See Comments) 08/28/2015   Oxycodone Palpitations 10/09/2020   Tramadol  Nausea And Vomiting 12/22/2015    SOCIAL HISTORY   Social history obtained entirely via chart review. No tobacco or alcohol use noted.   FAMILY HISTORY   Her family history includes Congestive Heart Failure (age of onset: 24) in her sister; Diabetes in her brother, father, mother, and sister; Heart disease in her father.   REVIEW OF SYSTEMS   ROS per history of present illness.  PHYSICAL EXAMINATION   Blood pressure (!) 177/101, pulse (!) 107, temperature 98.3 F (36.8 C), temperature source Oral, resp. rate 18, last menstrual period 10/10/2012, SpO2 100 %.    There were no vitals filed for this visit.  GENERAL: Person laying in bed, sobbing inconsolably in moderate distress HENT: Normocephalic, atraumatic. Moist mucous membranes. EYES: No scleral icterus or conjunctival injection appreciated. CV: Tachycardic, regular rhythm. No murmurs appreciated. Warm extremities. Mid-sternal pain reproducible on exam. PULM: Increased work of breathing from crying. Clear to ausculation bilaterally. GI: Abdomen soft, non-distended. Extremely tender out of proportion to palpation, especially in lower quadrants. No guarding appreciated. Hypoactive bowel sounds. MSK: Normal bulk, tone. No pitting edema bilateral lower extremities. SKIN: Warm, dry. No rashes or lesions appreciated. NEURO: Awake, alert, moving all extremities spontaneously. Grossly non-focal. PSYCH: Extremely upset, crying and sobbing throughout interview  SIGNIFICANT DIAGNOSTIC TESTS   ECG: Sinus tachycardia. Left axis deviation. Previous ECG from 6/11 with T-wave inversion on V1, no longer present. No ischemic ST changes appreciated.   I personally reviewed patient's ECG with my interpretation as above.  CXR: Normal cardiac countor, trachea midline. Good aeration throughout lungs. No effusions or congestions appreciated.   I personally reviewed patient's CXR with my interpretation as above.  LABS      Latest Ref Rng &  Units 09/10/2021    3:29 AM 08/28/2021   12:45 AM 08/26/2021    3:51 AM  CBC  WBC 4.0 - 10.5 K/uL 11.4  10.3  11.0   Hemoglobin 12.0 - 15.0 g/dL 7.9  7.6  7.6   Hematocrit 36.0 - 46.0 % 25.3  23.4  24.4   Platelets 150 - 400 K/uL 448  296  343       Latest Ref Rng & Units 09/10/2021    3:29 AM 08/28/2021   12:45 AM 08/27/2021   12:40 AM  BMP  Glucose 70 - 99 mg/dL 119  224  154    156   BUN 6 - 20 mg/dL 29  33  35    33   Creatinine 0.44 - 1.00 mg/dL 3.02  4.58  5.17    5.24   Sodium 135 - 145 mmol/L 140  135  138    134   Potassium 3.5 - 5.1 mmol/L 2.8  3.4  2.9    2.8  Chloride 98 - 111 mmol/L 106  105  104    103   CO2 22 - 32 mmol/L '19  18  19    20   ' Calcium 8.9 - 10.3 mg/dL 8.4  8.4  8.8    8.5     CONSULTS   N/a  ASSESSMENT   Deborah [name] is 56yo person with chronic abdominal pain 2/2 gastroparesis, type II diabetes mellitus, CKD IV, paroxysmal atrial fibrillation, chronic diastolic heart failure, recent admission for acute kidney injury on CKDIV 2/2 ATN/acute pyelonephritis admitted 7/6 for atypical chest pain and acute on chronic abdominal pain.   PLAN   Principal Problem:   Atypical chest pain Active Problems:   Essential hypertension, benign   Type 2 diabetes mellitus with hyperglycemia, with long-term current use of insulin (HCC)   Diabetic gastroparesis (HCC)   Abdominal pain   PAF (paroxysmal atrial fibrillation) (HCC)  #Atypical chest pain Deborah [name] is presenting to [facility] after 1d history of new-onset, mid-sternal chest pain. On arrival, ECG without ischemic changes and troponin 33>29. On my examination, chest pain is reproducible on exam. She previously had nuclear stress test 01/2019 which revealed intermediate risk study (due to EF 36% at the time) with medium defect in multiple locations, thought to be consistent with breast/chest wall artifact. No history of CAD or previous catheterizations. Per notes in September 2022, patient was to  follow-up with [facility], but I do not see where she followed up with them. Overall, I believe her chest pain is likely low-risk. However, given the troponin leak we will plan to keep for observation, monitor on telemetry, and order Echo to assess for structural heart disease.  - Admit to observation - Overnight telemetry - Follow-up Echocardiogram - Follow-up outpatient cardiology  #Acute on chronic abdominal pain During last admission, patient had an acute on chronic abdominal pain likely due to chronic gastroparesis. Her pain is out of proportion on exam today. With an elevated white count, I added a lactic acid this morning concerned for possible mesenteric ischemia given her multiple co-morbidities and presentation. Thankfully, this was normal. Since she reported new pain, I ordered a CT abdomen/pelvis for any new findings. This was largely normal, except for small nephrolithiasis, which I do not think is causing her current symptoms. She states she is no longer supposed to take Reglan, but this was prescribed to her at discharge on 6/22. It does not appear she received this during her hospitalization, though.  Previously on erythromycin, but it appears this was stopped a couple years ago. Patient is on narcotics, which likely worsens this issue. Per GI note from 2021: "Diabetic gastroparesis that will not improve as long as she remains on narcotic pain meds. Would not try Reglan due to history of tremors and this would be unlikely to help as long as she is on Dilaudid." Will need to have continued discussions around use of narcotic medication. Per chart review, she has had multiple discussions regarding this, but has refused to stop dilaudid. QT not prolonged on ECG, will need to monitor if she receives significant amount of anti-emetics.  - Home PO dilaudid 41m every 4 hours as needed - Prochlorperazine 157mq6h PRN - Try to avoid IV pain medications - Consider decreasing narcotic  regimen  #Hypokalemia #Hypomagnesemia Unclear cause, possibly due to decreased PO intake? Difficult to say without much of a history. Will replete today and check again this afternoon.  - IV and oral K replacement potassium today - IV magnesium  replacement - Repeat BMP this afternoon - BMP, Mg in AM  #Type II diabetes mellitus #Diabetic neuropathy Home regimen consists of 35 units long acting and 7 units short-acting three times daily with meals. Sugars at goal here. Will place on sliding scale, will give partial dosage of her home long-acting while inpatient. - Semglee 15 units nightly - SSI  #Chronic kidney disease IV At discharge last admission sCr 4.58 w/ GFR 11. This has improved, now sCr 3.02 with GFR 17. Will continue to monitor. - Continue home sodium bicarbonate tablets 643m three times daily - Daily BMP - I/O  #Anemia of chronic disease Hgb stable from previous admission. No signs or symptoms of acute bleeding. Will continue to monitor. Did have previous folate deficiency in 2017, this has been normal since then.  - Daily CBC  #Hypertension BP significantly elevated with systolics 1335-331 Likely acute pain worsening BP. Will re-start home regimen. - Metoprolol succinate 573mdaily  #Chronic diastolic heart failure Patient appears euvolemic, no concern for acute heart failure at this time. Will continue with home regimen. Previously on SGLT2i, now off of this due to renal function. Continue home beta blocker. - Metoprolol succinate 5083maily  #Paroxysmal atrial fibrillation Patient is in sinus rhythm on ECG. Sinus tachycardia currently likely due to pain and agitation. Will continue with home medications. - Metoprolol succinate 39m1mily - Eliquis 5mg 39mce daily  #Hyperlipidemia Chronic, stable issue.  - Atorvastatin 10mg 65my  BEST PRACTICE   DIET: HH IVF: n/a DVT PPX: Eliquis BOWEL: senokot-s CODE: FULL FAM COM: n/a  DISPO: Admit patient to  Observation with expected length of stay less than 2 midnights.  PhilliSanjuan Damenternal Medicine Resident PGY-3 Pager #[phone] 1:57 PM

## 2021-09-10 NOTE — ED Notes (Addendum)
Therapeutic conversation. Encouraged breathing technique to help with pain. Meds given. Pt calmer. [place] unable d/t pain, will return.

## 2021-09-10 NOTE — ED Notes (Signed)
Pt arrives to room 10 from room 20, pending inpt bed assignment. Pt sobbing/moaning. Dilaudid '1mg'$  IV given at 0636. Will monitor and tx.

## 2021-09-10 NOTE — ED Notes (Signed)
Back from CT, alert, NAD, calm, sitting upright in bed, no moaning or sobbing.

## 2021-09-10 NOTE — ED Provider Notes (Signed)
Deborah [name]  Provider Note  [name]: [id number] Arrival date & time: 09/10/21 3500  History Chief Complaint  Patient presents with   Chest Pain    Deborah [name] is a 56 y.o. female with history of HTN, DM, gastroparesis with chronic abdominal pain, reports she woke up during the night with severe midsternal pressure which is not a typical pain for her. She also reports sharp pain in her generalized abdomen which began afterwards. She has not been feeling well for the last day but unable to describe any other specific symptoms. She reports she had a heart attack at [facility] last year, but did not get a LHC or stent. The only admission I can find from [place] was for her gastroparesis, no mention of MI or elevated trop then. She did have a NucMed Stress in Nov 2020 which was intermediate risk. She was recently admitted for UTI and AKI, improved at discharge on Jun 23rd. She has been taking her home dilaudid but states it was not helping with this pain. Denies any drug or alcohol use.    Home Medications Prior to Admission medications   Medication Sig Start Date End Date Taking? Authorizing Provider  albuterol (PROVENTIL) (2.5 MG/3ML) 0.083% nebulizer solution Take 3 mLs (2.5 mg total) by nebulization every 6 (six) hours as needed for wheezing or shortness of breath. 04/06/19   Scot Jun, FNP  atorvastatin (LIPITOR) 10 MG tablet TAKE ONE TABLET BY MOUTH ONCE DAILY (AM) Patient taking differently: Take 10 mg by mouth daily. 10/22/20   Alcus Dad, MD  blood glucose meter kit and supplies KIT Dispense based on patient and insurance preference. Use up to four times daily as directed. (FOR ICD-9 250.00, 250.01). 12/13/18   [name], MD  cetirizine (ZYRTEC) 10 MG tablet TAKE 1 TABLET (10 MG TOTAL) BY MOUTH DAILY (AM) Patient taking differently: Take 10 mg by mouth daily. 06/30/21   Raulkar, Clide Deutscher, MD  DULoxetine HCl 40 MG CPEP TAKE 40 MG BY  MOUTH IN THE MORNING AND AT BEDTIME. 06/30/21   Raulkar, Clide Deutscher, MD  EASY COMFORT PEN NEEDLES 31G X 5 MM MISC USE 3 TIMES A DAY FOR INSULIN ADMINISTRATION 11/14/19   Meccariello, Mel Almond J, DO  ELIQUIS 5 MG TABS tablet TAKE 1 TABLET BY MOUTH 2 (TWO) TIMES DAILY. Patient taking differently: Take 5 mg by mouth 2 (two) times daily. 12/20/19   Meccariello, Bernita Raisin, DO  famotidine (PEPCID) 40 MG tablet Take 40 mg by mouth daily. 04/03/21 04/03/22  [provider]  fluticasone (FLONASE) 50 MCG/ACT nasal spray Place 2 sprays into both nostrils daily as needed for allergies or rhinitis. Patient taking differently: Place 1 spray into both nostrils daily. 12/19/18   Rai, Vernelle Emerald, MD  folic acid (FOLVITE) 1 MG tablet Take 1 mg by mouth daily. 04/07/21   [provider]  HYDROmorphone (DILAUDID) 4 MG tablet Take 1 tablet (4 mg total) by mouth 2 (two) times daily. May take an extra tablet when pain is severe 10 days out of the month . Please fill on 07/31/2021 07/28/21   Bayard Hugger, NP  isosorbide mononitrate (IMDUR) 60 MG 24 hr tablet TAKE 1 TABLET (60 MG TOTAL) BY MOUTH DAILY (AM) 09/07/21   Raulkar, Clide Deutscher, MD  LANTUS SOLOSTAR 100 UNIT/ML Solostar Pen Inject 35 Units into the skin at bedtime. 08/28/21   Gaylan Gerold, DO  meclizine (ANTIVERT) 25 MG tablet Take 1 tablet (25 mg total)  by mouth 3 (three) times daily as needed for dizziness. 02/20/21   Raulkar, Clide Deutscher, MD  methocarbamol (ROBAXIN) 750 MG tablet Take 1 tablet (750 mg total) by mouth every 8 (eight) hours. 08/28/21   Gaylan Gerold, DO  metoCLOPramide (REGLAN) 5 MG tablet TAKE 1 TABLET (5 MG TOTAL) BY MOUTH 4 TIMES DAILY. (AM+NOON+EVENING+BEDTIME) Patient taking differently: Take 5 mg by mouth 4 (four) times daily -  before meals and at bedtime. 07/28/21   Raulkar, Clide Deutscher, MD  metoprolol succinate (TOPROL-XL) 50 MG 24 hr tablet Take 1 tablet (50 mg total) by mouth daily. Take with or immediately following a meal. 11/24/20  08/15/21  Enzo Bi, MD  insulin lispro (HUMALOG) 100 UNIT/ML KwikPen Inject 7 Units into the skin 3 (three) times daily with meals. 08/28/21   Gaylan Gerold, DO  ondansetron (ZOFRAN ODT) 4 MG disintegrating tablet Take 1 tablet (4 mg total) by mouth every 8 (eight) hours as needed for nausea or vomiting. 05/30/21   Sherwood Gambler, MD  pantoprazole (PROTONIX) 40 MG tablet TAKE 1 TABLET (40 MG TOTAL) BY MOUTH 2 (TWO) TIMES DAILY. (AM+BEDTIME) 09/07/21   Raulkar, Clide Deutscher, MD  promethazine (PHENERGAN) 25 MG tablet Take 1 tablet (25 mg total) by mouth every 6 (six) hours as needed for nausea or vomiting. 05/30/21   Sherwood Gambler, MD  SENNA PLUS 8.6-50 MG tablet Take 1 tablet by mouth 2 (two) times daily. 05/19/21   [provider]  simethicone (MYLICON) 80 MG chewable tablet Chew 1 tablet (80 mg total) by mouth every 6 (six) hours as needed for up to 14 days for flatulence. 08/28/21 09/11/21  Gaylan Gerold, DO  sodium bicarbonate 650 MG tablet Take 1 tablet (650 mg total) by mouth 2 (two) times daily. 04/20/21   Jennye Boroughs, MD  Vitamin D, Ergocalciferol, (DRISDOL) 1.25 MG (50000 UNIT) CAPS capsule Take 50,000 Units by mouth every Monday. 04/07/21   [provider]  NOVOLOG FLEXPEN 100 UNIT/ML FlexPen Inject 7 Units into the skin 3 (three) times daily with meals. 08/28/21 08/28/21  Gaylan Gerold, DO     Allergies    Diazepam, Gabapentin, Iodinated contrast media, Isovue [iopamidol], Lisinopril, Metoclopramide, Nsaids, Penicillins, Tolmetin, Cyclobenzaprine, Rifamycins, Acetaminophen, Oxycodone, and Tramadol   Review of Systems   Review of Systems Please see HPI for pertinent positives and negatives  Physical Exam BP (!) 159/88   Pulse (!) 108   Temp 98.3 F (36.8 C) (Oral)   Resp (!) 28   LMP 10/10/2012   SpO2 100%   Physical Exam Vitals and nursing note reviewed.  Constitutional:      Appearance: Normal appearance.     Comments: Moaning and screaming in pain  HENT:     Head:  Normocephalic and atraumatic.     Nose: Nose normal.     Mouth/Throat:     Mouth: Mucous membranes are moist.  Eyes:     Extraocular Movements: Extraocular movements intact.     Conjunctiva/sclera: Conjunctivae normal.  Cardiovascular:     Rate and Rhythm: Regular rhythm. Tachycardia present.  Pulmonary:     Effort: Pulmonary effort is normal.     Breath sounds: Normal breath sounds.  Abdominal:     General: Abdomen is flat.     Palpations: Abdomen is soft.     Tenderness: There is abdominal tenderness (diffuse). There is no guarding (no peritoneal signs).  Musculoskeletal:        General: No swelling. Normal range of motion.  Cervical back: Neck supple.  Skin:    General: Skin is warm and dry.  Neurological:     General: No focal deficit present.     Mental Status: She is alert.  Psychiatric:        Mood and Affect: Mood normal.     ED Results / Procedures / Treatments   EKG EKG Interpretation  Date/Time:  Thursday September 10 2021 04:14:44 EDT Ventricular Rate:  121 PR Interval:  160 QRS Duration: 88 QT Interval:  336 QTC Calculation: 477 R Axis:   -23 Text Interpretation: Sinus tachycardia with Premature atrial complexes Left ventricular hypertrophy with repolarization abnormality ( R in aVL , Cornell product ) Abnormal ECG When compared with ECG of 10-Sep-2021 03:24, No significant change since last tracing EARLIER SAME DATE Confirmed by Calvert Cantor [id number]) on 09/10/2021 5:06:27 AM  Procedures Procedures  Medications Ordered in the ED Medications  HYDROmorphone (DILAUDID) injection 1 mg (1 mg Intravenous Given 09/10/21 0530)  HYDROmorphone (DILAUDID) injection 1 mg (1 mg Intravenous Given 09/10/21 0636)    Initial Impression and Plan  Patient here for chest pain and exacerbation of her chronic abdominal pain. She has a benign abdominal exam. She had labs done in triage showing CBC with anemia at baseline. CMP with CKD improved from recent admission. Initial Trop  is mildly elevated, no prior elevated values. Lipase is normal. I personally viewed the images from radiology studies and agree with radiologist interpretation: CXR is clear. Will treat her pain with IV dilaudid. NTG by EMS did not help. Check a delta trop and anticipate admission for further evaluation.    ED Course   Clinical Course as of 09/10/21 0648  Thu Sep 10, 2021  0354 Repeat troponin improved slightly, patient still moaning and crying in pain, mostly from her abdomen. Will discuss admission with the medicine team. Her PCP is [facility]. [CS]  [phone] Spoke with IM resident who will evaluate for admission.  [CS]    Clinical Course User Index [CS] Truddie Hidden, MD     MDM Rules/Calculators/A&P Medical Decision Making Problems Addressed: Chest pain, unspecified type: acute illness or injury Chronic abdominal pain: acute illness or injury Elevated troponin: acute illness or injury  Amount and/or Complexity of Data Reviewed Labs: ordered. Decision-making details documented in ED Course. Radiology: ordered and independent interpretation performed. Decision-making details documented in ED Course. ECG/medicine tests: ordered and independent interpretation performed. Decision-making details documented in ED Course.  Risk Prescription drug management. Decision regarding hospitalization.    Final Clinical Impression(s) / ED Diagnoses Final diagnoses:  Chest pain, unspecified type  Chronic abdominal pain  Elevated troponin    Rx / DC Orders ED Discharge Orders     None        Truddie Hidden, MD 09/10/21 [phone]

## 2021-09-10 NOTE — Progress Notes (Addendum)
Paged by RN regarding patient's pain.  Patient seen and evaluated at bedside. Patient demanding IV morphine. She has declined PO dilaudid saying that it takes too long to work and hurts her stomach. When offered oral morphine, she declined that as well stating that she couldn't swallow because her tongue was swollen. She later stated that she couldn't swallow because her throat hurt.  When offered viscous lidocaine and maalox to help with throat and abd pain so that she could take PO, she refused and demanded IV morphine stating only IV morhpine works for her pain.   On physical exam, patient was moving around bed freely. Comfort level appeared to improve when patient distracted with physical exam of mouth. No obvious swelling. Patient able to speak clearly and articulately. Able to clear secretions. No obvious ulcers noted in mouth. Neck supple. No peritoneal signs.  We will offer her viscous lidocaine, maalox, and one time dose of oral morphine.  She also has PO dilaudid '4mg'$  q4 hrs available, prochlorperazine '10mg'$  q6hrs PRN available for pain. Need to avoid IV pain medications.

## 2021-09-11 ENCOUNTER — Observation Stay (HOSPITAL_COMMUNITY): Payer: Medicare Other

## 2021-09-11 DIAGNOSIS — Z794 Long term (current) use of insulin: Secondary | ICD-10-CM

## 2021-09-11 DIAGNOSIS — R0789 Other chest pain: Secondary | ICD-10-CM | POA: Diagnosis not present

## 2021-09-11 DIAGNOSIS — R109 Unspecified abdominal pain: Secondary | ICD-10-CM

## 2021-09-11 DIAGNOSIS — E1143 Type 2 diabetes mellitus with diabetic autonomic (poly)neuropathy: Secondary | ICD-10-CM | POA: Diagnosis not present

## 2021-09-11 DIAGNOSIS — I10 Essential (primary) hypertension: Secondary | ICD-10-CM

## 2021-09-11 DIAGNOSIS — I480 Paroxysmal atrial fibrillation: Secondary | ICD-10-CM

## 2021-09-11 DIAGNOSIS — R079 Chest pain, unspecified: Secondary | ICD-10-CM

## 2021-09-11 DIAGNOSIS — E1165 Type 2 diabetes mellitus with hyperglycemia: Secondary | ICD-10-CM | POA: Diagnosis not present

## 2021-09-11 LAB — BASIC METABOLIC PANEL
Anion gap: 15 (ref 5–15)
BUN: 23 mg/dL — ABNORMAL HIGH (ref 6–20)
CO2: 18 mmol/L — ABNORMAL LOW (ref 22–32)
Calcium: 7.8 mg/dL — ABNORMAL LOW (ref 8.9–10.3)
Chloride: 108 mmol/L (ref 98–111)
Creatinine, Ser: 2.63 mg/dL — ABNORMAL HIGH (ref 0.44–1.00)
GFR, Estimated: 21 mL/min — ABNORMAL LOW (ref >60)
Glucose, Bld: 155 mg/dL — ABNORMAL HIGH (ref 70–99)
Potassium: 3.4 mmol/L — ABNORMAL LOW (ref 3.5–5.1)
Sodium: 141 mmol/L (ref 135–145)

## 2021-09-11 LAB — CBC
HCT: 25 % — ABNORMAL LOW (ref 36.0–46.0)
Hemoglobin: 7.9 g/dL — ABNORMAL LOW (ref 12.0–15.0)
MCH: 30.2 pg (ref 26.0–34.0)
MCHC: 31.6 g/dL (ref 30.0–36.0)
MCV: 95.4 fL (ref 80.0–100.0)
Platelets: 380 10*3/uL (ref 150–400)
RBC: 2.62 MIL/uL — ABNORMAL LOW (ref 3.87–5.11)
RDW: 13.7 % (ref 11.5–15.5)
WBC: 10.7 10*3/uL — ABNORMAL HIGH (ref 4.0–10.5)
nRBC: 0 % (ref 0.0–0.2)

## 2021-09-11 LAB — GLUCOSE, CAPILLARY
Glucose-Capillary: 145 mg/dL — ABNORMAL HIGH (ref 70–99)
Glucose-Capillary: 218 mg/dL — ABNORMAL HIGH (ref 70–99)
Glucose-Capillary: 214 mg/dL — ABNORMAL HIGH (ref 70–99)
Glucose-Capillary: 308 mg/dL — ABNORMAL HIGH (ref 70–99)

## 2021-09-11 LAB — MAGNESIUM: Magnesium: 1.3 mg/dL — ABNORMAL LOW (ref 1.7–2.4)

## 2021-09-11 LAB — ECHOCARDIOGRAM COMPLETE
Area-P 1/2: 4.63 cm2
S' Lateral: 3.5 cm

## 2021-09-11 IMAGING — CT CT ABDOMEN W/O CM
2 of 4 series · 14 of 46 positions shown, 16 images · non-contrast
Comparison: CT abdomen pelvis dated 07/22/2019.

CLINICAL DATA: 54-year-old female with abdominal pain. Status post
recent endoscopy.

EXAM:
CT ABDOMEN WITHOUT CONTRAST
TECHNIQUE: Multidetector CT imaging of the abdomen was performed following the
standard protocol without IV contrast.

[Series 2: axial st · axial · 0.98mm/px · z∈[-233,-28]mm · 11 of 49 slices shown, 13 images]
[im 4/49  soft-tissue]
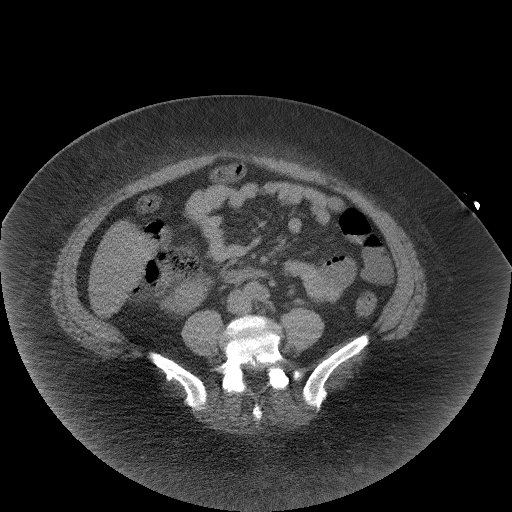
[im 4/49  bone]
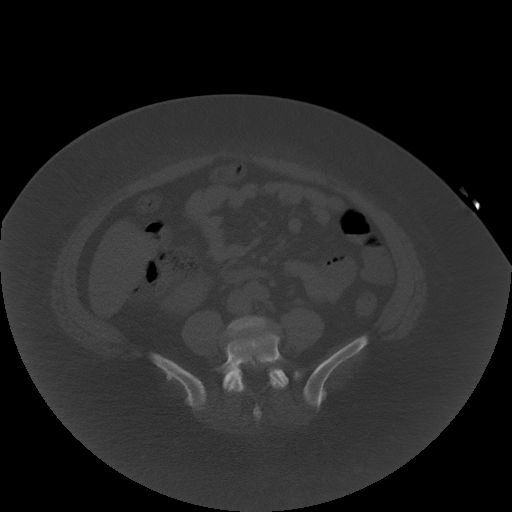
[im 8/49  soft-tissue]
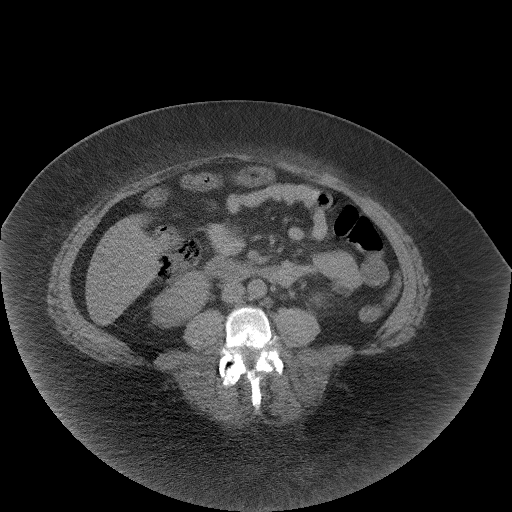
[im 12/49  soft-tissue]
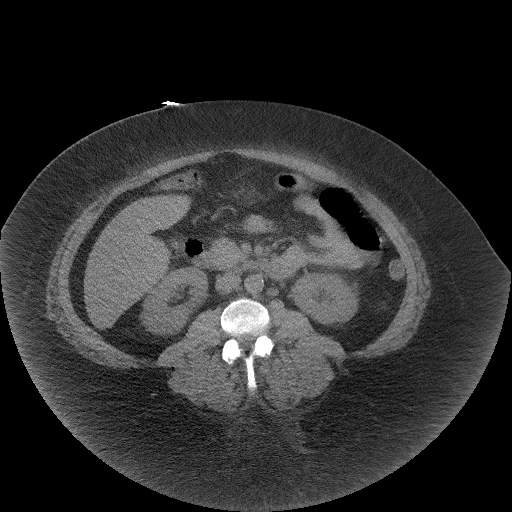
[im 15/49  soft-tissue]
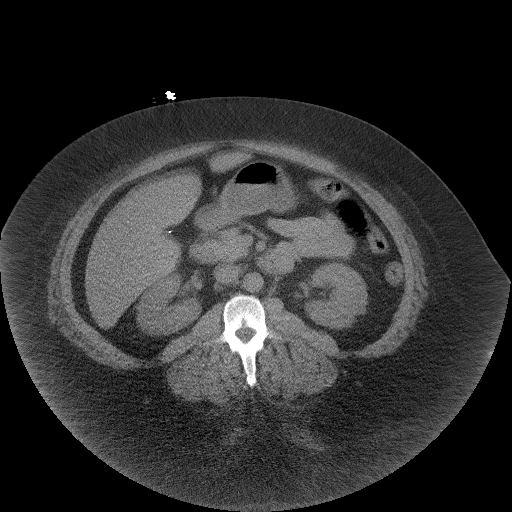
[im 19/49  soft-tissue]
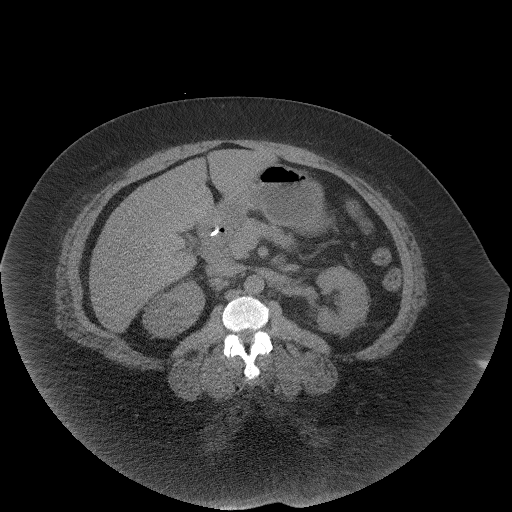
[im 26/49  soft-tissue]
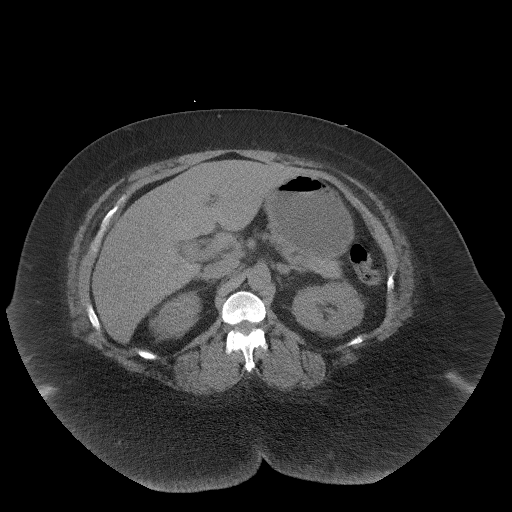
[im 30/49  soft-tissue]
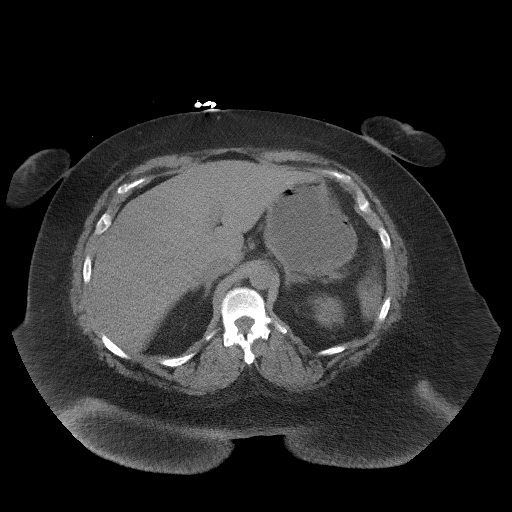
[im 34/49  soft-tissue]
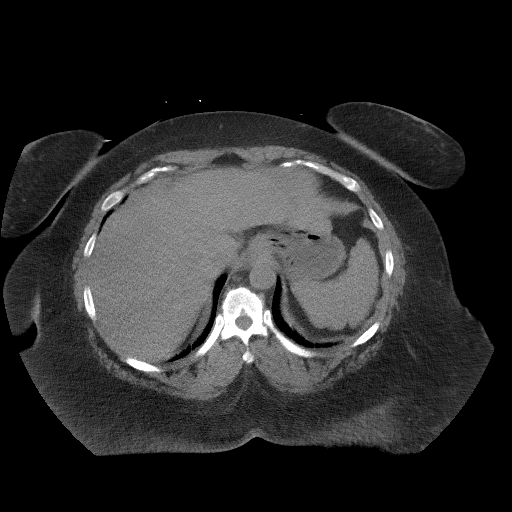
[im 37/49  soft-tissue]
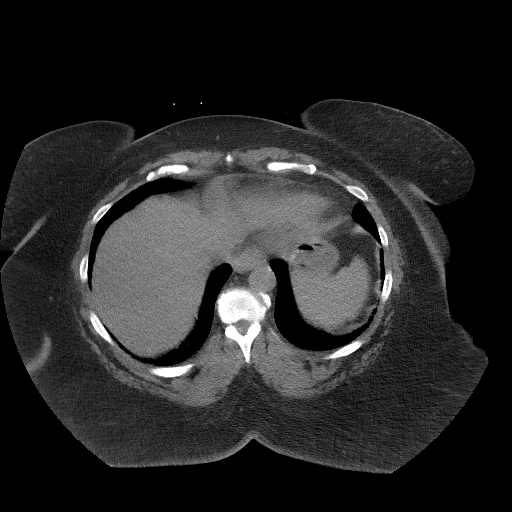
[im 37/49  bone]
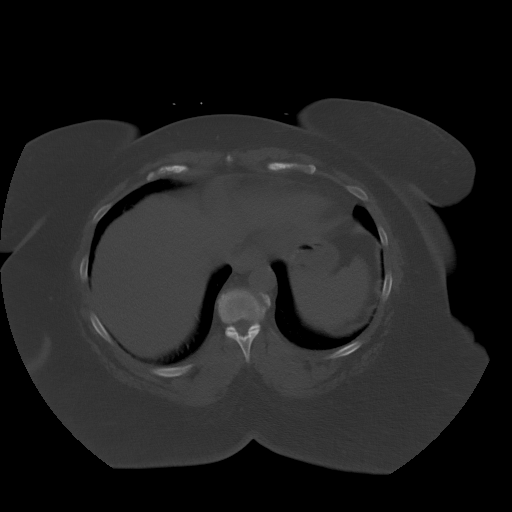
[im 41/49  soft-tissue]
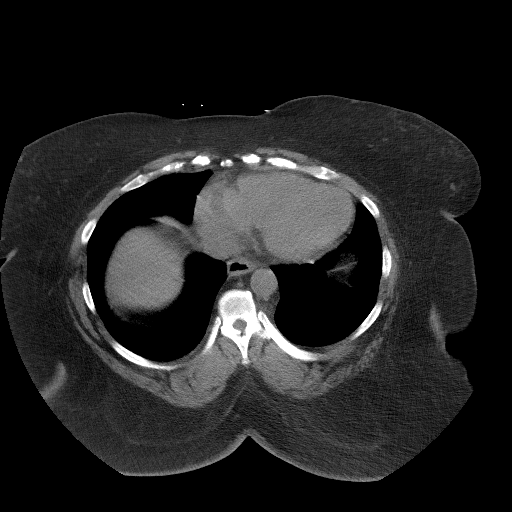
[im 45/49  soft-tissue]
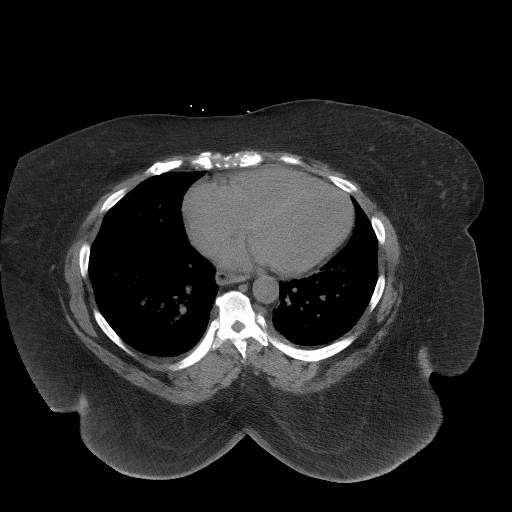

[Series 4: coronal st · coronal · 0.56mm/px · 3 of 84 slices shown]
[im 28/84  soft-tissue]
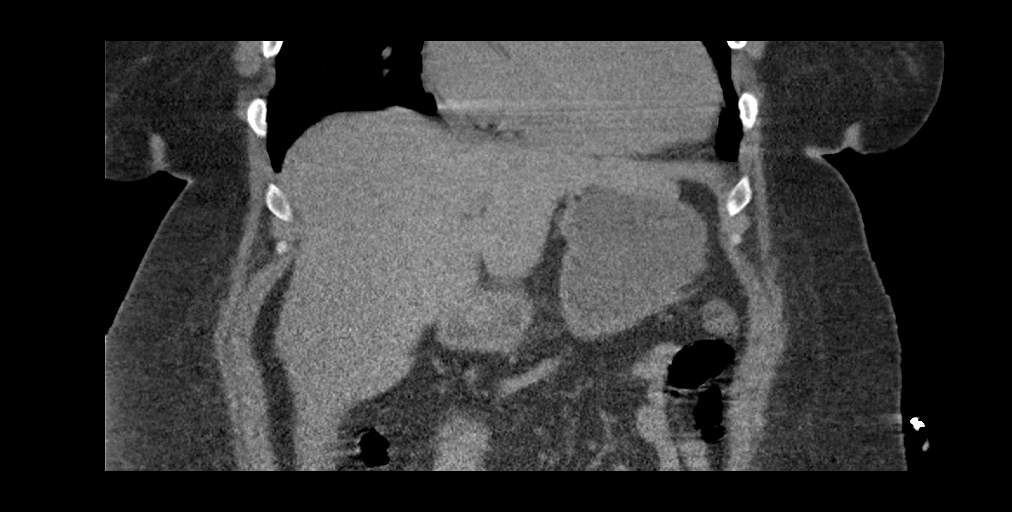
[im 37/84  soft-tissue]
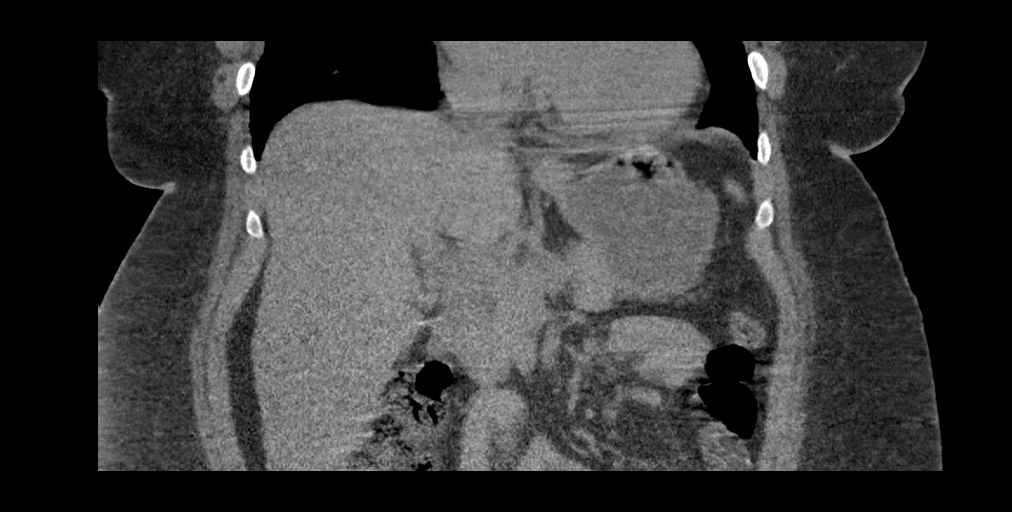
[im 47/84  soft-tissue]
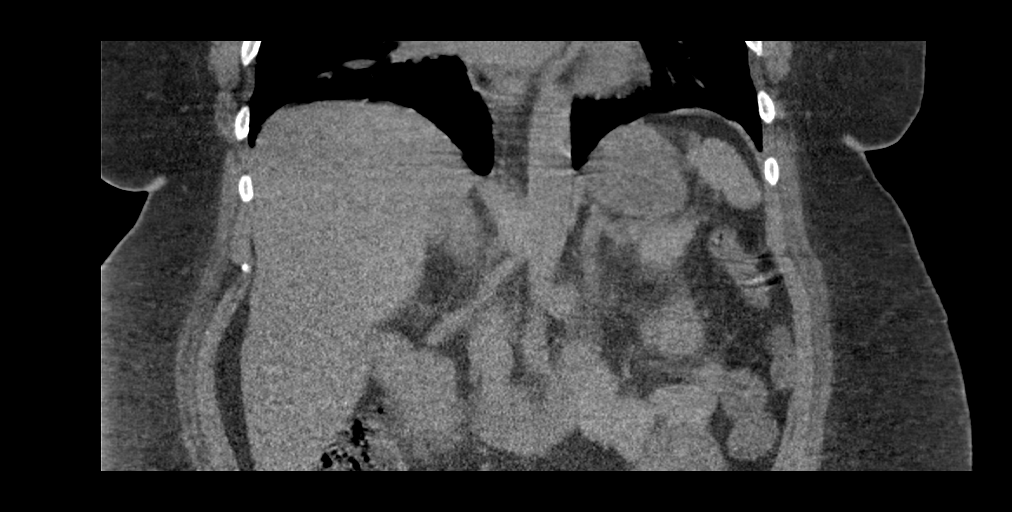

[14 of 46 positions shown; findings below may reference images not displayed]

FINDINGS: Evaluation of this exam is limited in the absence of intravenous
contrast.

Lower chest: There is diffuse interstitial and interlobular septal
prominence which may represent mild edema. Clinical correlation is
recommended.

No free air or free fluid identified in the visualized upper
abdomen.

Hepatobiliary: Fatty infiltration of the liver. No intrahepatic
biliary ductal dilatation. Cholecystectomy.

Pancreas: Unremarkable. No pancreatic ductal dilatation or
surrounding inflammatory changes.

Spleen: Normal in size without focal abnormality.

Adrenals/Urinary Tract: The adrenal glands and kidneys are
unremarkable.

Stomach/Bowel: There is a 15 mm linear radiopaque metallic density
in the region of the duodenal bulb, likely an ingested foreign
object. No bowel obstruction or active inflammation in the
visualized abdomen.

Vascular/Lymphatic: Mild atherosclerotic calcification of the aorta.
No adenopathy.

Other: None

Musculoskeletal: Mild degenerative changes of the spine. No acute
osseous pathology.
IMPRESSION: 1. A 15 mm linear radiopaque metallic density in the region of the
duodenal bulb, likely an ingested foreign object. No bowel
obstruction or active inflammation in the visualized abdomen. No
evidence of perforation.
2. Fatty liver.
3. Aortic Atherosclerosis ([id number]).

## 2021-09-11 MED ORDER — MAGNESIUM SULFATE 2 GM/50ML IV SOLN
2.0000 g | Freq: Once | INTRAVENOUS | Status: AC
Start: 2021-09-11 — End: 2021-09-11
  Administered 2021-09-11: 2 g via INTRAVENOUS
  Filled 2021-09-11: qty 50

## 2021-09-11 MED ORDER — POLYETHYLENE GLYCOL 3350 17 G PO PACK
17.0000 g | PACK | Freq: Every day | ORAL | Status: DC
  Administered 2021-09-14: 17 g via ORAL
  Filled 2021-09-11 (×2): qty 1

## 2021-09-11 MED ORDER — MAGNESIUM SULFATE 2 GM/50ML IV SOLN
2.0000 g | Freq: Once | INTRAVENOUS | Status: AC
  Administered 2021-09-11: 2 g via INTRAVENOUS
  Filled 2021-09-11: qty 50

## 2021-09-11 MED ORDER — MORPHINE SULFATE 15 MG PO TABS
15.0000 mg | ORAL_TABLET | Freq: Four times a day (QID) | ORAL | Status: DC | PRN
  Administered 2021-09-11: 15 mg via ORAL
  Filled 2021-09-11: qty 1

## 2021-09-11 MED ORDER — LIDOCAINE VISCOUS HCL 2 % MT SOLN
15.0000 mL | Freq: Once | OROMUCOSAL | Status: AC
  Administered 2021-09-11: 15 mL via OROMUCOSAL
  Filled 2021-09-11: qty 15

## 2021-09-11 MED ORDER — OXYCODONE HCL 5 MG PO TABS
7.5000 mg | ORAL_TABLET | Freq: Four times a day (QID) | ORAL | Status: DC | PRN
  Administered 2021-09-11 – 2021-09-12 (×2): 7.5 mg via ORAL
  Filled 2021-09-11 (×2): qty 2

## 2021-09-11 MED ORDER — OXYCODONE HCL 5 MG PO TABS: 7.5000 mg | ORAL_TABLET | Freq: Four times a day (QID) | ORAL | Status: DC | PRN

## 2021-09-11 MED ORDER — POTASSIUM CHLORIDE 10 MEQ/100ML IV SOLN
10.0000 meq | INTRAVENOUS | Status: AC
  Administered 2021-09-11 (×3): 10 meq via INTRAVENOUS
  Filled 2021-09-11 (×3): qty 100

## 2021-09-11 MED ORDER — POTASSIUM CHLORIDE CRYS ER 20 MEQ PO TBCR
40.0000 meq | EXTENDED_RELEASE_TABLET | Freq: Two times a day (BID) | ORAL | Status: DC
Start: 2021-09-11 — End: 2021-09-11

## 2021-09-11 MED ORDER — DICYCLOMINE HCL 20 MG PO TABS
20.0000 mg | ORAL_TABLET | Freq: Three times a day (TID) | ORAL | Status: DC
  Filled 2021-09-11 (×11): qty 1

## 2021-09-11 MED ORDER — SENNOSIDES-DOCUSATE SODIUM 8.6-50 MG PO TABS
1.0000 | ORAL_TABLET | Freq: Every day | ORAL | Status: DC
  Administered 2021-09-11: 1 via ORAL
  Filled 2021-09-11: qty 1

## 2021-09-11 MED ORDER — LIDOCAINE VISCOUS HCL 2 % MT SOLN
15.0000 mL | Freq: Once | OROMUCOSAL | Status: AC
Start: 2021-09-11 — End: 2021-09-11
  Administered 2021-09-11: 15 mL via OROMUCOSAL
  Filled 2021-09-11: qty 15

## 2021-09-11 NOTE — Plan of Care (Signed)

## 2021-09-11 NOTE — Hospital Course (Addendum)
States she is doing so-so. Stomach pain is still there. Has not had a BM. Is having some chest pain, worse with palpation.   Does have some nausea. Has not been able to eat or drink. Last ate a couple of days ago. Sharp pain her stomach across the bottom of her stomach. Does not worsen with any particular movement or with eating. No pattern that she has noted. Has not had a problem with constipation in the past. Has not passed a BM in a couple of days. Belly pain does not improve with passing a BM.   Takes dilaudid twice a day at home.    ---from 10/2019 The pain is present on both sides of her body in her upper and lower extremities. It is worst in her belly, radiates down her arms, and her legs and feet. The pain feels sharp in the belly and aching in her extremities. She has had benefit from Dilaudid. She has not had benefit from Morphine- it worked for a while and the benefits stopped. The pain makes her nauseous. She was given 7 days of Dilaudid by her physician. She is allergic to Tramadol (she gets tremors and palpitations). She has not tried Lyrica. She has had leg weakness with Gabapentin. She on Cymbalta for fibromyalgia. She has never tried Amitriptyline. The '2mg'$  of Dilaudid takes the edge of her pain. She takes this once per day and if the pain is very severe she takes another.

## 2021-09-11 NOTE — TOC Initial Note (Signed)
Transition of Care [facility]) - Initial/Assessment Note    Patient Details  Name: Deborah [name] MRN: [id number] Date of Birth: [birth year]  [facility] CM/SW Contact:    Ninfa Meeker, RN Phone Number: 09/11/2021, 11:32 AM  Clinical Narrative:                 Transition of Care screening Note:  Transition of Care Department [facility]) has reviewed patient and no TOC needs have been identified at this time. We will continue to monitor patient advancement through Interdisciplinary progressions. If new patient transition needs arise, please place a consult.          Patient Goals and CMS Choice        Expected Discharge Plan and Services                                                Prior Living Arrangements/Services                       Activities of Daily Living      Permission Sought/Granted                  Emotional Assessment              Admission diagnosis:  Atypical chest pain [R07.89] Chronic abdominal pain [R10.9, G89.29] Elevated troponin [R77.8] Chest pain, unspecified type [R07.9] Patient Active Problem List   Diagnosis Date Noted   Atypical chest pain 09/10/2021   Acute pyelonephritis    AKI (acute kidney injury) ([place]) 08/16/2021   Acute on chronic kidney failure ([place]) 08/15/2021   PAF (paroxysmal atrial fibrillation) (HCC)    Prolonged QT interval    HFrEF (heart failure with reduced ejection fraction) ([place]) 02/21/2020   Chronic pain 09/04/2019   Malignant carcinoid tumor of duodenum ([place])    Nonalcoholic fatty liver disease 06/05/2019   Restrictive lung disease secondary to obesity    History of gastric ulcer    Fibroid uterus 22/29/7989   Chronic systolic CHF (congestive heart failure) ([place]) 12/19/2018   Intractable nausea and vomiting 12/17/2018   Abdominal pain 07/17/2018   Anxiety 11/29/2017   Spinal stenosis, lumbar region without neurogenic claudication 08/03/2017   GERD  (gastroesophageal reflux disease) 03/19/2017   Depression 03/19/2017   Morbid obesity ([place])    Normocytic anemia 08/16/2016   Diabetic gastroparesis ([place]) 08/16/2016   Gout 06/05/2016   Type 2 diabetes mellitus with hyperglycemia, with long-term current use of insulin ([place]) 05/25/2015   Chronic kidney disease, stage 3b ([place]) 05/25/2015   Essential hypertension, benign 09/28/2013   PCP:  Cena [place], MD Pharmacy:   [place], [place] - [address] [place] [address] Phone: [phone] Fax: [phone]  [facility] [address] [place] [address] Phone: [phone] Fax: [phone]     Social Determinants of Health (SDOH) Interventions    Readmission Risk Interventions    07/02/2019   11:00 AM 06/25/2019    3:14 PM 01/29/2019   10:18 AM  Readmission Risk Prevention Plan  Transportation Screening Complete Complete Complete  PCP or Specialist Appt within 3-5 Days   Complete  HRI or [place]   Complete  Social Work Consult for [place] Planning/Counseling   Complete  Palliative  Care Screening   Not Applicable  Medication Review (RN Care Manager) Complete Complete Complete  PCP or Specialist appointment within 3-5 days of discharge  Complete   HRI or [place] Complete    SW Recovery Care/Counseling Consult Complete Complete   Palliative Care Screening Not Applicable Not [place] Not Applicable Not Applicable

## 2021-09-11 NOTE — Progress Notes (Addendum)
Subjective:  Pt reporting severe sharp lower abdominal pain that is constant. She reports minimal relief of her pain with PO morphine. She reports same to take 2-3 hours to achieve any pain relief and then only lasting an hour following initiation of relief. She reports no improvement in pain with her home PO dilaudid and reports nausea and worsening of her abdominal symptoms with same. She does not want to continue her home pain management of PO dilaudid. Pt reporting that IV pain medication is the only medication that will relief her current pain.   She has been unable to tolerate PO intake due to the pain. She denies recent BM and is unable to recall her last. She denies a history of constipation. She had not urinated at the time of visit but does not feel like she will have issues urinating.   Objective:  Vital signs in last 24 hours: Vitals:   09/11/21 0213 09/11/21 0506 09/11/21 0803 09/11/21 0951  BP: (!) 170/79  (!) 149/94 (!) 166/106  Pulse: (!) 103 (!) 111 99   Resp: '18 18 16   '$ Temp: 98.6 F (37 C) 98.5 F (36.9 C) 98.5 F (36.9 C)   TempSrc: Oral Oral Oral   SpO2: 100%  98%    Weight change:   Intake/Output Summary (Last 24 hours) at 09/11/2021 1305 Last data filed at 09/11/2021 0041 Gross per 24 hour  Intake 472.54 ml  Output --  Net 472.54 ml   GENERAL: Patient sitting up in bed, in no apparent distress HENT: Normocephalic, atraumatic. Moist mucous membranes. EYES: No scleral icterus or conjunctival injection appreciated. CV: Tachycardic, regular rhythm. No murmurs appreciated. Warm extremities. Minimal mid-sternal pain reproducible on exam. PULM: Regular respiratory effort. Clear to ausculation bilaterally. GI: Refused abdominal exam. MSK: Normal bulk, tone. No pitting edema bilateral lower extremities. SKIN: Warm, dry. No rashes or lesions appreciated. NEURO: Awake, alert, moving all extremities spontaneously. Grossly non-focal.  Assessment/Plan:  Principal  Problem:   Atypical chest pain Active Problems:   Essential hypertension, benign   Type 2 diabetes mellitus with hyperglycemia, with long-term current use of insulin (HCC)   Diabetic gastroparesis (HCC)   Abdominal pain   PAF (paroxysmal atrial fibrillation) ([place])  56yo female with chronic abdominal pain 2/2 gastroparesis, type II diabetes mellitus, CKD IV, paroxysmal atrial fibrillation, chronic diastolic heart failure, recent admission for acute kidney injury on CKDIV 2/2 ATN/acute pyelonephritis admitted 7/6 for atypical chest pain and acute on chronic abdominal pain.   Acute on chronic abdominal pain Patient recently seen for acute on chronic abdominal pain likely due to chronic gastroparesis. Pt refused abdominal exam today due to pain. Lactic acid returned normal less concerned for possible mesenteric ischemia. Will hold off on CTA abdomen pelvis at this time given concern for kidney function and in the setting of a nml lactic. CT abdomen/pelvis WO contrast  showing no acute process. Feel her abdominal pain is largely due to her gastroparesis. She is unable to tolerate Reglan, same was prescribed to her at discharge on 6/22. Previously on erythromycin, which was stopped a couple years ago. Patient is on home PO dilaudid '4mg'$  BID, which likely worsens this issue. She has not tried Lyrica. She has had leg weakness with Gabapentin. She on Cymbalta for fibromyalgia. She has never tried Amitriptyline. Had extensive conversation with patient around risks of IV pain medications, pt amendable to continue PO more times a day to keep her pain controlled. Will need to have continued discussions around use  of narcotic medication. - Telemetry to monitor QT given anti-emetics use - Prochlorperazine '10mg'$  q6h PRN - PO oxycodone vs dilaudid pending patient discussion this PM, avoid morphine in the setting of CKD - Consider decreasing narcotic regimen - Viscous lidocaine and maalox - Bentyl TID with  reassessment tomorrow  Atypical chest pain New-onset, mid-sternal reproducible chest pain. On arrival, ECG without ischemic changes and troponin 33>29. She previously had nuclear stress test 01/2019 which revealed intermediate risk study (due to EF 36% at the time) with medium defect in multiple locations, thought to be consistent with breast/chest wall artifact. No history of CAD or previous catheterizations. Per notes in September 2022, patient was to follow[facility]up with [facility]. Overall, believe her chest pain is likely low-risk. - Lidocaine patch  - Telemetry - Follow-up Echocardiogram - Follow-up outpatient cardiology  Hypokalemia Hypomagnesemia Unclear cause, possibly due to decreased PO intake. Mg increased from 1.1 to 1.3 today. Potassium increased from 3.2 to 3.4 today. Will plan to replete again today. - IV K replacement potassium  - IV magnesium replacement - BMP, Mg in AM   Type II diabetes mellitus Diabetic neuropathy Home regimen consists of 35 units long acting and 7 units short-acting three times daily with meals. Sugars at goal here. Will place on sliding scale, will give partial dosage of her home long-acting while inpatient. - Semglee 15 units nightly - SSI   Chronic kidney disease IV At discharge last admission sCr 4.58 w/ GFR 11. This has improved, now sCr 2.63 with GFR 23. Will continue to monitor. - Continue home sodium bicarbonate tablets '650mg'$  three times daily - Daily BMP - I/O   Anemia of chronic disease Hgb stable from previous admission. No signs or symptoms of acute bleeding. Will continue to monitor. - Daily CBC   Hypertension BP significantly elevated with systolics 578-469. Likely acute pain worsening BP. Will re-start home regimen. - Metoprolol succinate '50mg'$  daily   Chronic diastolic heart failure Patient appears euvolemic, no concern for acute heart failure at this time. Will continue with home regimen. Previously on SGLT2i, now off of this due to  renal function. Continue home beta blocker. - Metoprolol succinate '50mg'$  daily   Paroxysmal atrial fibrillation Patient is in sinus rhythm on ECG. Sinus tachycardia currently likely due to pain and decreased PO intake. Will continue with home medications. - Metoprolol succinate '50mg'$  daily - Eliquis '5mg'$  twice daily   Hyperlipidemia Chronic, stable issue.  - Atorvastatin '10mg'$  daily   BEST PRACTICE    DIET: HH IVF: n/a DVT PPX: Eliquis BOWEL: senokot-s and miralax CODE: FULL FAM COM: n/a   DISPO: Admit patient to Observation with expected length of stay less than 2 midnights.   LOS: 0 days   Shirlyn Goltz, Medical Student 09/11/2021, 1:05 PM

## 2021-09-12 DIAGNOSIS — R0789 Other chest pain: Secondary | ICD-10-CM | POA: Diagnosis not present

## 2021-09-12 LAB — GLUCOSE, CAPILLARY
Glucose-Capillary: 211 mg/dL — ABNORMAL HIGH (ref 70–99)
Glucose-Capillary: 280 mg/dL — ABNORMAL HIGH (ref 70–99)
Glucose-Capillary: 234 mg/dL — ABNORMAL HIGH (ref 70–99)
Glucose-Capillary: 253 mg/dL — ABNORMAL HIGH (ref 70–99)

## 2021-09-12 LAB — MAGNESIUM: Magnesium: 2 mg/dL (ref 1.7–2.4)

## 2021-09-12 LAB — CBC
HCT: 26.3 % — ABNORMAL LOW (ref 36.0–46.0)
Hemoglobin: 8.4 g/dL — ABNORMAL LOW (ref 12.0–15.0)
MCH: 31 pg (ref 26.0–34.0)
MCHC: 31.9 g/dL (ref 30.0–36.0)
MCV: 97 fL (ref 80.0–100.0)
Platelets: 379 10*3/uL (ref 150–400)
RBC: 2.71 MIL/uL — ABNORMAL LOW (ref 3.87–5.11)
RDW: 13.7 % (ref 11.5–15.5)
WBC: 9.6 10*3/uL (ref 4.0–10.5)
nRBC: 0 % (ref 0.0–0.2)

## 2021-09-12 LAB — BASIC METABOLIC PANEL
Anion gap: 8 (ref 5–15)
BUN: 20 mg/dL (ref 6–20)
CO2: 22 mmol/L (ref 22–32)
Calcium: 8.1 mg/dL — ABNORMAL LOW (ref 8.9–10.3)
Chloride: 107 mmol/L (ref 98–111)
Creatinine, Ser: 2.68 mg/dL — ABNORMAL HIGH (ref 0.44–1.00)
GFR, Estimated: 20 mL/min — ABNORMAL LOW (ref >60)
Glucose, Bld: 229 mg/dL — ABNORMAL HIGH (ref 70–99)
Potassium: 3 mmol/L — ABNORMAL LOW (ref 3.5–5.1)
Sodium: 137 mmol/L (ref 135–145)

## 2021-09-12 LAB — POTASSIUM: Potassium: 3.6 mmol/L (ref 3.5–5.1)

## 2021-09-12 MED ORDER — POTASSIUM CHLORIDE 10 MEQ/100ML IV SOLN
10.0000 meq | INTRAVENOUS | Status: AC
  Filled 2021-09-12: qty 100

## 2021-09-12 MED ORDER — POTASSIUM CHLORIDE 10 MEQ/100ML IV SOLN
10.0000 meq | Freq: Once | INTRAVENOUS | Status: AC
Start: 2021-09-12 — End: 2021-09-12
  Administered 2021-09-12: 10 meq via INTRAVENOUS

## 2021-09-12 MED ORDER — FAMOTIDINE 20 MG PO TABS
20.0000 mg | ORAL_TABLET | Freq: Every day | ORAL | Status: DC
  Administered 2021-09-13: 20 mg via ORAL
  Filled 2021-09-12 (×2): qty 1

## 2021-09-12 MED ORDER — SENNOSIDES-DOCUSATE SODIUM 8.6-50 MG PO TABS
2.0000 | ORAL_TABLET | Freq: Two times a day (BID) | ORAL | Status: DC
  Administered 2021-09-12 – 2021-09-14 (×6): 2 via ORAL
  Filled 2021-09-12 (×7): qty 2

## 2021-09-12 MED ORDER — OXYCODONE HCL 5 MG PO TABS
5.0000 mg | ORAL_TABLET | Freq: Four times a day (QID) | ORAL | Status: DC | PRN
  Administered 2021-09-12 – 2021-09-13 (×4): 5 mg via ORAL
  Filled 2021-09-12 (×4): qty 1

## 2021-09-12 MED ORDER — POTASSIUM CHLORIDE CRYS ER 20 MEQ PO TBCR
40.0000 meq | EXTENDED_RELEASE_TABLET | Freq: Two times a day (BID) | ORAL | Status: DC
  Administered 2021-09-12: 40 meq via ORAL
  Filled 2021-09-12: qty 2

## 2021-09-12 MED ORDER — INSULIN GLARGINE-YFGN 100 UNIT/ML ~~LOC~~ SOLN
20.0000 [IU] | Freq: Every day | SUBCUTANEOUS | Status: DC
  Administered 2021-09-12: 20 [IU] via SUBCUTANEOUS
  Filled 2021-09-12 (×2): qty 0.2

## 2021-09-12 MED ORDER — ONDANSETRON 4 MG PO TBDP
4.0000 mg | ORAL_TABLET | Freq: Three times a day (TID) | ORAL | Status: DC | PRN
Start: 2021-09-12 — End: 2021-09-15
  Administered 2021-09-13 – 2021-09-15 (×2): 4 mg via ORAL
  Filled 2021-09-12 (×4): qty 1

## 2021-09-12 MED ORDER — POTASSIUM CHLORIDE 10 MEQ/100ML IV SOLN
10.0000 meq | INTRAVENOUS | Status: AC
  Administered 2021-09-12: 10 meq via INTRAVENOUS
  Filled 2021-09-12: qty 100

## 2021-09-12 NOTE — Progress Notes (Addendum)
Subjective:  She is feeling a little better, abdominal pain improved "it is getting there". Able to eat some breakfast but requesting a regular diet as she does not like HH diet. Her tongue feels better and has not been having trouble eating. She has not been able to pass a BM yet. States her belly pain has improved with oxycodone. She has not been walking around much because she is usually wheelchair bound due to spinal stenosis.  Still scared and does not want to take PO or IV dilaudid as she reports it caused severe pain and nausea when she received a dose in the ED. Discusses need for transitioning from oxycodone to home dilaudid and monitoring BM and PO intake prior to discharge. She's amenable to to trial PO dilaudid today every six hours.   Objective:  Vital signs in last 24 hours: Vitals:   09/11/21 2032 09/12/21 0011 09/12/21 0414 09/12/21 0739  BP: 137/71 (!) 150/85 (!) 141/83 116/83  Pulse: 86 86 85 94  Resp: '16 16 16 16  '$ Temp: 97.7 F (36.5 C) (!) 97.5 F (36.4 C) 97.7 F (36.5 C) 97.9 F (36.6 C)  TempSrc: Oral Oral Oral Oral  SpO2:    99%   Weight change:   Intake/Output Summary (Last 24 hours) at 09/12/2021 7209 Last data filed at 09/11/2021 1804 Gross per 24 hour  Intake 200 ml  Output 800 ml  Net -600 ml   GENERAL: Patient sitting up in bed, in no apparent distress HENT: Normocephalic, atraumatic. Moist mucous membranes. EYES: No scleral icterus or conjunctival injection appreciated. CV: Regular rate and rhythm. No murmurs appreciated. Warm extremities. PULM: Regular respiratory effort. Clear to ausculation bilaterally. GI: Refusing abdominal exam. MSK: Normal bulk, tone. No pitting edema bilateral lower extremities. SKIN: Warm, dry. No rashes or lesions appreciated. NEURO: Awake, alert, moving all extremities spontaneously. Grossly non-focal.  Assessment/Plan:  Principal Problem:   Atypical chest pain Active Problems:   Essential hypertension, benign    Type 2 diabetes mellitus with hyperglycemia, with long-term current use of insulin (HCC)   Diabetic gastroparesis (HCC)   Abdominal pain   PAF (paroxysmal atrial fibrillation) ([place])  56yo female with chronic abdominal pain 2/2 gastroparesis, type II diabetes mellitus, CKD IV, paroxysmal atrial fibrillation, chronic diastolic heart failure, recent admission for acute kidney injury on CKDIV 2/2 ATN/acute pyelonephritis admitted 7/6 for atypical chest pain and acute on chronic abdominal pain now being assessed for pain control   Acute on chronic abdominal pain Patient recently seen for acute on chronic abdominal pain likely due to chronic gastroparesis. Has been unable to tolerate Reglan and Erythromycin for same concern, worsening on home narcotic pain medication. Had extensive conversation with patient around risks of IV pain medications. Pt with improvement in pain with PO oxycodone yesterday, will continue on this therapy today. Patient aware oxycodone is not feasible long term given her kidney disease. Will begin to transition patient to PO Dilaudid today. - D/c Prochlorperazine given minimal improvement, will try Zofran per pharms recommendation - Telemetry to monitor QT given anti-emetics use - PO oxycodone titrated from 7.'5mg'$  to '5mg'$  with transition back to Dilaudid tonight or tomorrow - Avoid morphine in the setting of CKD - Consider decreasing narcotic regimen - Viscous lidocaine and maalox  - Bentyl TID    Hypokalemia Hypomagnesemia Unclear cause, possibly due to decreased PO intake. Mg increased from 1.3 to 2 today. Potassium decreased from 3.4 to 3.0 today. Will plan to replete potassium again today.  -  IV K replacement potassium  - BMP following   Atypical chest pain New-onset, mid-sternal reproducible chest pain. Echo consistent with prior. - Lidocaine patch  - Telemetry - Follow-up outpatient cardiology   Type II diabetes mellitus Diabetic neuropathy Home regimen consists  of 35 units long acting and 7 units short-acting three times daily with meals. Sugars at goal here. Will place on sliding scale, will give partial dosage of her home long-acting while inpatient. - Semglee 20 units nightly - SSI   Chronic kidney disease IV At discharge last admission sCr 4.58 w/ GFR 11. This has improved, now sCr 2.68 with GFR 20. Will continue to monitor. - Continue home sodium bicarbonate tablets '650mg'$  three times daily - Daily BMP - I/O   Anemia of chronic disease Hgb stable from previous admission. No signs or symptoms of acute bleeding. Will continue to monitor. - Daily CBC   Hypertension BP within regular range likely improved with improved pain control.  - Metoprolol succinate '50mg'$  daily   Chronic diastolic heart failure Patient appears euvolemic, no concern for acute heart failure at this time. Will continue with home regimen. Previously on SGLT2i, now off of this due to renal function. Continue home beta blocker. - Metoprolol succinate '50mg'$  daily   Paroxysmal atrial fibrillation Patient is in sinus rhythm on ECG. Sinus rhythm. Will continue with home medications. - Metoprolol succinate '50mg'$  daily - Eliquis '5mg'$  twice daily   Hyperlipidemia Chronic, stable issue.  - Atorvastatin '10mg'$  daily   BEST PRACTICE    DIET: Regular diet  IVF: n/a DVT PPX: Eliquis BOWEL: senokot-s and miralax CODE: FULL FAM COM: n/a   DISPO: Admit patient to Observation with expected length of stay less than 2 midnights.   LOS: 0 days   Shirlyn Goltz, Medical Student 09/12/2021, 9:04 AM  Attestation for Student Documentation:  I personally was present and performed or re-performed the history, physical exam and medical decision-making activities of this service and have verified that the service and findings are accurately documented in the student's note.  Romana Juniper, MD 09/12/2021, 11:51 AM

## 2021-09-13 ENCOUNTER — Observation Stay (HOSPITAL_COMMUNITY): Payer: Medicare Other

## 2021-09-13 DIAGNOSIS — R0789 Other chest pain: Secondary | ICD-10-CM | POA: Diagnosis not present

## 2021-09-13 LAB — TROPONIN I (HIGH SENSITIVITY)
Troponin I (High Sensitivity): 24 ng/L — ABNORMAL HIGH (ref <18)
Troponin I (High Sensitivity): 28 ng/L — ABNORMAL HIGH (ref <18)

## 2021-09-13 LAB — BASIC METABOLIC PANEL
Anion gap: 12 (ref 5–15)
BUN: 29 mg/dL — ABNORMAL HIGH (ref 6–20)
CO2: 20 mmol/L — ABNORMAL LOW (ref 22–32)
Calcium: 8.6 mg/dL — ABNORMAL LOW (ref 8.9–10.3)
Chloride: 104 mmol/L (ref 98–111)
Creatinine, Ser: 3.06 mg/dL — ABNORMAL HIGH (ref 0.44–1.00)
GFR, Estimated: 17 mL/min — ABNORMAL LOW (ref >60)
Glucose, Bld: 198 mg/dL — ABNORMAL HIGH (ref 70–99)
Potassium: 3.5 mmol/L (ref 3.5–5.1)
Sodium: 136 mmol/L (ref 135–145)

## 2021-09-13 LAB — MAGNESIUM: Magnesium: 1.9 mg/dL (ref 1.7–2.4)

## 2021-09-13 LAB — GLUCOSE, CAPILLARY
Glucose-Capillary: 181 mg/dL — ABNORMAL HIGH (ref 70–99)
Glucose-Capillary: 292 mg/dL — ABNORMAL HIGH (ref 70–99)
Glucose-Capillary: 214 mg/dL — ABNORMAL HIGH (ref 70–99)
Glucose-Capillary: 152 mg/dL — ABNORMAL HIGH (ref 70–99)

## 2021-09-13 LAB — CBC
HCT: 27.2 % — ABNORMAL LOW (ref 36.0–46.0)
Hemoglobin: 8.2 g/dL — ABNORMAL LOW (ref 12.0–15.0)
MCH: 30.4 pg (ref 26.0–34.0)
MCHC: 30.1 g/dL (ref 30.0–36.0)
MCV: 100.7 fL — ABNORMAL HIGH (ref 80.0–100.0)
Platelets: 381 10*3/uL (ref 150–400)
RBC: 2.7 MIL/uL — ABNORMAL LOW (ref 3.87–5.11)
RDW: 13.8 % (ref 11.5–15.5)
WBC: 8.3 10*3/uL (ref 4.0–10.5)
nRBC: 0 % (ref 0.0–0.2)

## 2021-09-13 MED ORDER — OXYCODONE HCL 5 MG PO TABS
5.0000 mg | ORAL_TABLET | Freq: Once | ORAL | Status: AC
  Administered 2021-09-13: 5 mg via ORAL
  Filled 2021-09-13: qty 1

## 2021-09-13 MED ORDER — NITROGLYCERIN 0.4 MG SL SUBL
SUBLINGUAL_TABLET | SUBLINGUAL | Status: AC
  Administered 2021-09-13: 0.4 mg
  Filled 2021-09-13: qty 1

## 2021-09-13 MED ORDER — BISACODYL 10 MG RE SUPP
10.0000 mg | Freq: Once | RECTAL | Status: DC
  Filled 2021-09-13: qty 1

## 2021-09-13 MED ORDER — HYDROMORPHONE HCL 2 MG PO TABS
4.0000 mg | ORAL_TABLET | Freq: Two times a day (BID) | ORAL | Status: DC
Start: 2021-09-13 — End: 2021-09-13

## 2021-09-13 MED ORDER — LINACLOTIDE 145 MCG PO CAPS
145.0000 ug | ORAL_CAPSULE | Freq: Every day | ORAL | Status: DC
  Administered 2021-09-14 – 2021-09-15 (×2): 145 ug via ORAL
  Filled 2021-09-13 (×3): qty 1

## 2021-09-13 MED ORDER — INSULIN GLARGINE-YFGN 100 UNIT/ML ~~LOC~~ SOLN
28.0000 [IU] | Freq: Every day | SUBCUTANEOUS | Status: DC
  Administered 2021-09-13: 28 [IU] via SUBCUTANEOUS
  Filled 2021-09-13 (×2): qty 0.28

## 2021-09-13 MED ORDER — OXYCODONE HCL 5 MG PO TABS: 5.0000 mg | ORAL_TABLET | Freq: Once | ORAL | Status: DC

## 2021-09-13 MED ORDER — OXYCODONE HCL 5 MG PO TABS
5.0000 mg | ORAL_TABLET | Freq: Once | ORAL | Status: DC
Start: 2021-09-13 — End: 2021-09-13

## 2021-09-13 MED ORDER — HYDROMORPHONE HCL 2 MG PO TABS
4.0000 mg | ORAL_TABLET | Freq: Two times a day (BID) | ORAL | Status: DC
  Administered 2021-09-13 (×2): 4 mg via ORAL
  Filled 2021-09-13 (×2): qty 2

## 2021-09-13 NOTE — Significant Event (Signed)
Rapid Response Event Note   Reason for Call :  Chest pain  Initial Focused Assessment:  The patient was lying in bed and appeared to be in discomfort. She had received one dose of nitro SL before my arrival. She stated that the 1st dose of nitro SL has not helped with her pain. When asked where her pain was she pointed at her abdomen and epigastric region. She states that the pain is no different than what brought her to the hospital. She states that it is a constant stabbing pain with no radiation.  She denies jaw pain, left arm pain, palpitations, and is not diaphoretic. She was able to speak in complete sentences but was markedly anxious and crying. Emotional support offered to the patient while I was holding her hand.   Resident is at bedside examining the patient. One '5mg'$  dose of oxy was ordered for her acute pain.  EKG is negative for changes when compared to her previous one. CBG 292. BP 160/100 initially      Interventions:  EKG with no change from previous Troponin ordered PO oxy '5mg'$  given Atarax ordered to help with her anxiety   Event Summary:   MD Notified: Dr. Rodney Booze bedside Call Time: 6503 Arrival Time: [place] Time: [place], RN

## 2021-09-13 NOTE — Progress Notes (Signed)
.  Subjective:  ON: 1 episode of pain, for which she received one dose of oxycodone.   This [id number] better this morning, pain wise. Still not able to pass a bowel moment or gas. Has been able to tolerate PO.   Addendum: Page to bedside for acute chest pain with associated increase in BP to 160/100s. When I arrived, patient had received a dose of nitroglycerin, gotten an EKG, placed on supplemental O2 by [place] for comfort, and rapid response nurse at bedside. Patient was in acute distress endorsing substernal pain and concomitant sharp abdominal pain, similar to pain that brought patient to hospital this admission. +nausea but denied shortness of breath, diaphoresis, lightheadedness. Patient scared and screaming. P exam: patient in distress, oriented x3,  + tachycardia with normal rhythm, lung auscultation clear bilaterally. Chest pain reproducible on palpation. Discomfort to palpation on lower abdomen. Patient was warm and dry to touch, with 2+ pulses bilaterally in U and L extremities. EKG reviewed with rapid response nurse and with Dr. Allyson Sabal with normal sinus rhythm, no acute signs of ischemia, and largely unchanged from prior. Patient ABD xray from earlier showed no signs of obstruction, normal bowel gas, and normal stool burden.   Will order troponins now to rule out pain secondary to ischemia, but suspect this is in the setting of abdominal pain patient has been endorsing this hospitalization for which an etiology has not been found despite thorough evaluation.  - Troponin were drawn, pending, will follow.  - 1 dose of 5 mg PO oxycodone administered at bedside  - Patient on 4 mg of dilaudid BID PO - Adding hydroxyzine to help with anxiety   Objective:  Vital signs in last 24 hours: Vitals:   09/13/21 0051 09/13/21 0559 09/13/21 0803 09/13/21 1220  BP: (!) 137/95 (!) 149/66 (!) 157/87 (!) 181/108  Pulse: 78 89 86 96  Resp: '16 18 16   '$ Temp: 98.2 F (36.8 C) 97.6 F (36.4 C) 97.6 F  (36.4 C)   TempSrc: Oral Oral Oral   SpO2: 94% 96% 100% 100%   Weight change:  No intake or output data in the 24 hours ending 09/13/21 1300  GENERAL: Patient laying in bed, in no acute distress CV: Regular rate and rhythm. No murmurs appreciated PULM: normal respiratory effort. Clear to auscultation GI: Normal bowel sounds. Discomfort with palpation of lower abdomen. MSK: Normal bulk, tone. No pitting edema SKIN: warm and dry NEURO: awake and oriented x3. Conversation appropriate.  Assessment/Plan:  Principal Problem:   Atypical chest pain Active Problems:   Essential hypertension, benign   Type 2 diabetes mellitus with hyperglycemia, with long-term current use of insulin (HCC)   Diabetic gastroparesis (HCC)   Abdominal pain   PAF (paroxysmal atrial fibrillation) ([place])  56yo female with chronic abdominal pain 2/2 gastroparesis, type II diabetes mellitus, CKD IV, paroxysmal atrial fibrillation, chronic diastolic heart failure, recent admission for acute kidney injury on CKDIV 2/2 ATN/acute pyelonephritis admitted 7/6 for atypical chest pain and acute on chronic abdominal pain now being assessed for pain control   Acute on chronic abdominal pain Patient recently seen for acute on chronic abdominal pain likely due to chronic gastroparesis. Has been unable to tolerate Reglan and Erythromycin for same concern, worsening on home narcotic pain medication. Pt with improvement in pain with PO '5mg'$  oxycodone yesterday, discussed trialing home dose of dilaudid at '4mg'$  twice a day, and patient agreed. - Continue Zofran PRN - Telemetry to monitor QT given anti-emetics use - Dilaudid '4mg'$   BID - Avoid morphine in the setting of CKD - Viscous lidocaine and maalox  - Start trial of Lincess 145 mcf prior to meals if no evidence of obstruction on KUB - D/C'd Bentyl TID as patient is not taking it.    Hypokalemia Hypomagnesemia Unclear cause, possibly due to decreased PO intake. Mg i1.9 today.  Potassium at 3.5 today. - BMP following   Atypical chest pain New-onset, mid-sternal reproducible chest pain. Echo consistent with prior. - Lidocaine patch  - Telemetry - Follow-up outpatient cardiology   Type II diabetes mellitus Diabetic neuropathy BG 198 today. Required 20 unit seemglee and 16 total units of sliding scale. - Semglee 28 units nightly - SSI   Chronic kidney disease IV At discharge last admission sCr 4.58 w/ GFR 11. Tcr 3.06 today and GFR 17 - Continue home sodium bicarbonate tablets '650mg'$  three times daily - Daily BMP - I/O   Anemia of chronic disease Hgb stable from previous admission. No signs or symptoms of acute bleeding. Will continue to monitor. - Daily CBC   Hypertension BP within regular range likely improved with improved pain control.  - Metoprolol succinate '50mg'$  daily   Chronic diastolic heart failure Patient appears euvolemic, no concern for acute heart failure at this time. Will continue with home regimen. Previously on SGLT2i, now off of this due to renal function. Continue home beta blocker. - Metoprolol succinate '50mg'$  daily   Paroxysmal atrial fibrillation Patient is in sinus rhythm on ECG. Sinus rhythm. Will continue with home medications. - Metoprolol succinate '50mg'$  daily - Eliquis '5mg'$  twice daily   Hyperlipidemia Chronic, stable issue.  - Atorvastatin '10mg'$  daily   BEST PRACTICE    DIET: Regular diet  IVF: n/a DVT PPX: Eliquis BOWEL: senokot-s and miralax CODE: FULL FAM COM: n/a   DISPO: Admit patient to Observation with expected length of stay less than 2 midnights. Romana Juniper, MD 09/13/2021, 1:00 PM

## 2021-09-13 NOTE — Progress Notes (Signed)
Pt requesting additional pain medication. IM paged.

## 2021-09-13 NOTE — Plan of Care (Signed)
  Problem: Education: Goal: Ability to describe self-care measures that may prevent or decrease complications (Diabetes Survival Skills Education) will improve Outcome: Progressing   Problem: Nutrition: Goal: Adequate nutrition will be maintained Outcome: Progressing

## 2021-09-13 NOTE — Plan of Care (Signed)

## 2021-09-14 ENCOUNTER — Encounter: Payer: Medicare Other | Admitting: Registered Nurse

## 2021-09-14 DIAGNOSIS — I252 Old myocardial infarction: Secondary | ICD-10-CM | POA: Diagnosis not present

## 2021-09-14 DIAGNOSIS — E1143 Type 2 diabetes mellitus with diabetic autonomic (poly)neuropathy: Secondary | ICD-10-CM | POA: Diagnosis not present

## 2021-09-14 DIAGNOSIS — G8929 Other chronic pain: Secondary | ICD-10-CM | POA: Diagnosis not present

## 2021-09-14 DIAGNOSIS — M797 Fibromyalgia: Secondary | ICD-10-CM | POA: Diagnosis present

## 2021-09-14 DIAGNOSIS — Z794 Long term (current) use of insulin: Secondary | ICD-10-CM | POA: Diagnosis not present

## 2021-09-14 DIAGNOSIS — I130 Hypertensive heart and chronic kidney disease with heart failure and stage 1 through stage 4 chronic kidney disease, or unspecified chronic kidney disease: Secondary | ICD-10-CM | POA: Diagnosis not present

## 2021-09-14 DIAGNOSIS — E876 Hypokalemia: Secondary | ICD-10-CM | POA: Diagnosis present

## 2021-09-14 DIAGNOSIS — E785 Hyperlipidemia, unspecified: Secondary | ICD-10-CM | POA: Diagnosis present

## 2021-09-14 DIAGNOSIS — I480 Paroxysmal atrial fibrillation: Secondary | ICD-10-CM | POA: Diagnosis present

## 2021-09-14 DIAGNOSIS — Z7901 Long term (current) use of anticoagulants: Secondary | ICD-10-CM | POA: Diagnosis not present

## 2021-09-14 DIAGNOSIS — Z993 Dependence on wheelchair: Secondary | ICD-10-CM | POA: Diagnosis not present

## 2021-09-14 DIAGNOSIS — E1122 Type 2 diabetes mellitus with diabetic chronic kidney disease: Secondary | ICD-10-CM | POA: Diagnosis not present

## 2021-09-14 DIAGNOSIS — K5900 Constipation, unspecified: Secondary | ICD-10-CM | POA: Diagnosis not present

## 2021-09-14 DIAGNOSIS — K3184 Gastroparesis: Secondary | ICD-10-CM | POA: Diagnosis not present

## 2021-09-14 DIAGNOSIS — Z87440 Personal history of urinary (tract) infections: Secondary | ICD-10-CM | POA: Diagnosis not present

## 2021-09-14 DIAGNOSIS — R072 Precordial pain: Secondary | ICD-10-CM | POA: Diagnosis not present

## 2021-09-14 DIAGNOSIS — Z79899 Other long term (current) drug therapy: Secondary | ICD-10-CM | POA: Diagnosis not present

## 2021-09-14 DIAGNOSIS — F419 Anxiety disorder, unspecified: Secondary | ICD-10-CM | POA: Diagnosis not present

## 2021-09-14 DIAGNOSIS — E1165 Type 2 diabetes mellitus with hyperglycemia: Secondary | ICD-10-CM | POA: Diagnosis not present

## 2021-09-14 DIAGNOSIS — D631 Anemia in chronic kidney disease: Secondary | ICD-10-CM | POA: Diagnosis not present

## 2021-09-14 DIAGNOSIS — M4800 Spinal stenosis, site unspecified: Secondary | ICD-10-CM | POA: Diagnosis present

## 2021-09-14 DIAGNOSIS — I5042 Chronic combined systolic (congestive) and diastolic (congestive) heart failure: Secondary | ICD-10-CM | POA: Diagnosis not present

## 2021-09-14 DIAGNOSIS — N184 Chronic kidney disease, stage 4 (severe): Secondary | ICD-10-CM | POA: Diagnosis not present

## 2021-09-14 DIAGNOSIS — R0789 Other chest pain: Secondary | ICD-10-CM | POA: Diagnosis present

## 2021-09-14 LAB — BASIC METABOLIC PANEL
Anion gap: 13 (ref 5–15)
Anion gap: 15 (ref 5–15)
BUN: 27 mg/dL — ABNORMAL HIGH (ref 6–20)
BUN: 27 mg/dL — ABNORMAL HIGH (ref 6–20)
CO2: 22 mmol/L (ref 22–32)
CO2: 23 mmol/L (ref 22–32)
Calcium: 9.1 mg/dL (ref 8.9–10.3)
Calcium: 9.5 mg/dL (ref 8.9–10.3)
Chloride: 102 mmol/L (ref 98–111)
Chloride: 101 mmol/L (ref 98–111)
Creatinine, Ser: 2.26 mg/dL — ABNORMAL HIGH (ref 0.44–1.00)
Creatinine, Ser: 2.57 mg/dL — ABNORMAL HIGH (ref 0.44–1.00)
GFR, Estimated: 25 mL/min — ABNORMAL LOW (ref >60)
GFR, Estimated: 21 mL/min — ABNORMAL LOW (ref >60)
Glucose, Bld: 212 mg/dL — ABNORMAL HIGH (ref 70–99)
Glucose, Bld: 242 mg/dL — ABNORMAL HIGH (ref 70–99)
Potassium: 3.2 mmol/L — ABNORMAL LOW (ref 3.5–5.1)
Potassium: 3.9 mmol/L (ref 3.5–5.1)
Sodium: 137 mmol/L (ref 135–145)
Sodium: 139 mmol/L (ref 135–145)

## 2021-09-14 LAB — CBC
HCT: 27.4 % — ABNORMAL LOW (ref 36.0–46.0)
Hemoglobin: 8.8 g/dL — ABNORMAL LOW (ref 12.0–15.0)
MCH: 30.2 pg (ref 26.0–34.0)
MCHC: 32.1 g/dL (ref 30.0–36.0)
MCV: 94.2 fL (ref 80.0–100.0)
Platelets: 384 K/uL (ref 150–400)
RBC: 2.91 MIL/uL — ABNORMAL LOW (ref 3.87–5.11)
RDW: 13 % (ref 11.5–15.5)
WBC: 12.3 K/uL — ABNORMAL HIGH (ref 4.0–10.5)
nRBC: 0 % (ref 0.0–0.2)

## 2021-09-14 LAB — MAGNESIUM
Magnesium: 1.3 mg/dL — ABNORMAL LOW (ref 1.7–2.4)
Magnesium: 2.3 mg/dL (ref 1.7–2.4)

## 2021-09-14 LAB — GLUCOSE, CAPILLARY
Glucose-Capillary: 179 mg/dL — ABNORMAL HIGH (ref 70–99)
Glucose-Capillary: 227 mg/dL — ABNORMAL HIGH (ref 70–99)
Glucose-Capillary: 309 mg/dL — ABNORMAL HIGH (ref 70–99)
Glucose-Capillary: 310 mg/dL — ABNORMAL HIGH (ref 70–99)

## 2021-09-14 MED ORDER — INSULIN ASPART 100 UNIT/ML IJ SOLN
3.0000 [IU] | Freq: Three times a day (TID) | INTRAMUSCULAR | Status: DC
  Administered 2021-09-14 (×3): 3 [IU] via SUBCUTANEOUS

## 2021-09-14 MED ORDER — MAGNESIUM SULFATE 2 GM/50ML IV SOLN
2.0000 g | Freq: Once | INTRAVENOUS | Status: AC
  Administered 2021-09-14: 2 g via INTRAVENOUS
  Filled 2021-09-14: qty 50

## 2021-09-14 MED ORDER — ORAL CARE MOUTH RINSE
15.0000 mL | OROMUCOSAL | Status: DC | PRN
Start: 2021-09-14 — End: 2021-09-15

## 2021-09-14 MED ORDER — METHOCARBAMOL 500 MG PO TABS
500.0000 mg | ORAL_TABLET | Freq: Two times a day (BID) | ORAL | Status: DC | PRN
  Administered 2021-09-15: 500 mg via ORAL
  Filled 2021-09-14: qty 1

## 2021-09-14 MED ORDER — MAGNESIUM SULFATE 2 GM/50ML IV SOLN
2.0000 g | Freq: Once | INTRAVENOUS | Status: AC
Start: 2021-09-14 — End: 2021-09-14
  Administered 2021-09-14: 2 g via INTRAVENOUS
  Filled 2021-09-14: qty 50

## 2021-09-14 MED ORDER — HYDROMORPHONE HCL 2 MG PO TABS
4.0000 mg | ORAL_TABLET | Freq: Two times a day (BID) | ORAL | Status: DC
Start: 2021-09-14 — End: 2021-09-14

## 2021-09-14 MED ORDER — POTASSIUM CHLORIDE 10 MEQ/100ML IV SOLN
10.0000 meq | INTRAVENOUS | Status: AC
  Administered 2021-09-14 (×3): 10 meq via INTRAVENOUS
  Filled 2021-09-14 (×3): qty 100

## 2021-09-14 MED ORDER — HYDROMORPHONE HCL 2 MG PO TABS
4.0000 mg | ORAL_TABLET | Freq: Once | ORAL | Status: AC
  Administered 2021-09-14: 4 mg via ORAL

## 2021-09-14 MED ORDER — HYDROMORPHONE HCL 2 MG PO TABS
2.0000 mg | ORAL_TABLET | Freq: Once | ORAL | Status: AC | PRN
  Administered 2021-09-14: 2 mg via ORAL
  Filled 2021-09-14: qty 1

## 2021-09-14 MED ORDER — INSULIN GLARGINE-YFGN 100 UNIT/ML ~~LOC~~ SOLN
35.0000 [IU] | Freq: Every day | SUBCUTANEOUS | Status: DC
  Administered 2021-09-14: 35 [IU] via SUBCUTANEOUS
  Filled 2021-09-14 (×2): qty 0.35

## 2021-09-14 MED ORDER — HYDROXYZINE HCL 25 MG PO TABS
25.0000 mg | ORAL_TABLET | Freq: Two times a day (BID) | ORAL | Status: DC | PRN
  Administered 2021-09-15: 25 mg via ORAL
  Filled 2021-09-14: qty 1

## 2021-09-14 MED ORDER — HYDROMORPHONE HCL 2 MG PO TABS
4.0000 mg | ORAL_TABLET | Freq: Two times a day (BID) | ORAL | Status: DC
  Administered 2021-09-14 – 2021-09-15 (×3): 4 mg via ORAL
  Filled 2021-09-14 (×4): qty 2

## 2021-09-14 NOTE — Progress Notes (Deleted)
Subjective:    Patient ID: Deborah [name], female    DOB: 1966-03-08, 56 y.o.   MRN: [id number]  HPI  Pain Inventory Average Pain 10 Pain Right Now 10 My pain is sharp, stabbing, and aching  In the last 24 hours, has pain interfered with the following? General activity 10 Relation with others 10 Enjoyment of life 10 What TIME of day is your pain at its worst? morning , daytime, evening, and night Sleep (in general) Poor  Pain is worse with: walking, bending, sitting, inactivity, standing, and some activites Pain improves with: rest, medication, and injections Relief from Meds: 10  Family History  Problem Relation Age of Onset  . Diabetes Mother   . Diabetes Father   . Heart disease Father   . Diabetes Sister   . Congestive Heart Failure Sister 29  . Diabetes Brother    Social History   Socioeconomic History  . Marital status: Married    Spouse name: Not on file  . Number of children: Not on file  . Years of education: Not on file  . Highest education level: Not on file  Occupational History  . Not on file  Tobacco Use  . Smoking status: Never  . Smokeless tobacco: Never  Vaping Use  . Vaping Use: Never used  Substance and Sexual Activity  . Alcohol use: No  . Drug use: No  . Sexual activity: Not Currently    Birth control/protection: None  Other Topics Concern  . Not on file  Social History Narrative   ** Merged History Encounter **       Social Determinants of Health   Financial Resource Strain: Not on file  Food Insecurity: Not on file  Transportation Needs: Not on file  Physical Activity: Not on file  Stress: Not on file  Social Connections: Not on file   Past Surgical History:  Procedure Laterality Date  . BIOPSY  07/27/2019   Procedure: BIOPSY;  Surgeon: Clarene Essex, MD;  Location: [facility];  Service: Endoscopy;;  . BIOPSY  07/30/2019   Procedure: BIOPSY;  Surgeon: Otis Brace, MD;  Location: [facility];  Service:  Gastroenterology;;  . CATARACT EXTRACTION  01/2014  . CHOLECYSTECTOMY    . COLONOSCOPY WITH PROPOFOL N/A 07/30/2019   Procedure: COLONOSCOPY WITH PROPOFOL;  Surgeon: Otis Brace, MD;  Location: [facility];  Service: Gastroenterology;  Laterality: N/A;  . ESOPHAGOGASTRODUODENOSCOPY N/A 07/27/2019   Procedure: ESOPHAGOGASTRODUODENOSCOPY (EGD);  Surgeon: Clarene Essex, MD;  Location: [facility];  Service: Endoscopy;  Laterality: N/A;  . ESOPHAGOGASTRODUODENOSCOPY N/A 07/26/2020   Procedure: ESOPHAGOGASTRODUODENOSCOPY (EGD);  Surgeon: Arta Silence, MD;  Location: [facility];  Service: Endoscopy;  Laterality: N/A;  . ESOPHAGOGASTRODUODENOSCOPY (EGD) WITH PROPOFOL N/A 08/02/2019   Procedure: ESOPHAGOGASTRODUODENOSCOPY (EGD) WITH PROPOFOL;  Surgeon: Otis Brace, MD;  Location: [facility];  Service: Gastroenterology;  Laterality: N/A;  . HEMOSTASIS CLIP PLACEMENT  08/02/2019   Procedure: HEMOSTASIS CLIP PLACEMENT;  Surgeon: Otis Brace, MD;  Location: [facility];  Service: Gastroenterology;;  . POLYPECTOMY  07/30/2019   Procedure: POLYPECTOMY;  Surgeon: Otis Brace, MD;  Location: [facility];  Service: Gastroenterology;;  . POLYPECTOMY  08/02/2019   Procedure: POLYPECTOMY;  Surgeon: Otis Brace, MD;  Location: [facility];  Service: Gastroenterology;;   Past Surgical History:  Procedure Laterality Date  . BIOPSY  07/27/2019   Procedure: BIOPSY;  Surgeon: Clarene Essex, MD;  Location: [facility];  Service: Endoscopy;;  . BIOPSY  07/30/2019   Procedure: BIOPSY;  Surgeon: Otis Brace,  MD;  Location: [facility];  Service: Gastroenterology;;  . CATARACT EXTRACTION  01/2014  . CHOLECYSTECTOMY    . COLONOSCOPY WITH PROPOFOL N/A 07/30/2019   Procedure: COLONOSCOPY WITH PROPOFOL;  Surgeon: Otis Brace, MD;  Location: [facility];  Service: Gastroenterology;  Laterality: N/A;  . ESOPHAGOGASTRODUODENOSCOPY N/A 07/27/2019   Procedure:  ESOPHAGOGASTRODUODENOSCOPY (EGD);  Surgeon: Clarene Essex, MD;  Location: [facility];  Service: Endoscopy;  Laterality: N/A;  . ESOPHAGOGASTRODUODENOSCOPY N/A 07/26/2020   Procedure: ESOPHAGOGASTRODUODENOSCOPY (EGD);  Surgeon: Arta Silence, MD;  Location: [facility];  Service: Endoscopy;  Laterality: N/A;  . ESOPHAGOGASTRODUODENOSCOPY (EGD) WITH PROPOFOL N/A 08/02/2019   Procedure: ESOPHAGOGASTRODUODENOSCOPY (EGD) WITH PROPOFOL;  Surgeon: Otis Brace, MD;  Location: [facility];  Service: Gastroenterology;  Laterality: N/A;  . HEMOSTASIS CLIP PLACEMENT  08/02/2019   Procedure: HEMOSTASIS CLIP PLACEMENT;  Surgeon: Otis Brace, MD;  Location: [facility];  Service: Gastroenterology;;  . POLYPECTOMY  07/30/2019   Procedure: POLYPECTOMY;  Surgeon: Otis Brace, MD;  Location: [facility];  Service: Gastroenterology;;  . POLYPECTOMY  08/02/2019   Procedure: POLYPECTOMY;  Surgeon: Otis Brace, MD;  Location: [facility];  Service: Gastroenterology;;   Past Medical History:  Diagnosis Date  . Acute back pain with sciatica, left   . Acute back pain with sciatica, right   . AKI (acute kidney injury) ([place])   . Anemia, unspecified   . Cancer ([place])   . Carcinoid tumor of duodenum   . Chest pain with normal coronary angiography 2019  . Chronic kidney disease, stage 3b ([place])   . Chronic pain   . Chronic systolic CHF (congestive heart failure) ([place])   . Diabetes mellitus   . DKA (diabetic ketoacidosis) ([place])   . Drug-seeking behavior    21 hospitalizations and 14 CT a/p in 2 years for N/V and abdominal pain, demanding only IV dilaudid  . Elevated troponin    chronic  . Esophageal reflux   . Fibromyalgia   . Gastric ulcer   . Gastroparesis   . Gout   . Hyperlipidemia   . Hypertension   . Hypokalemia   . Hypomagnesemia   . Lumbosacral stenosis   . LVH (left ventricular hypertrophy)   . Morbid obesity ([place])   . NICM (nonischemic cardiomyopathy) ([place])   . PAF  (paroxysmal atrial fibrillation) ([place])   . Stroke ([place]) 02/2011  . Thrombocytosis   . Vitamin B12 deficiency anemia    LMP 10/10/2012   Opioid Risk Score:   Fall Risk Score:  `1  Depression screen PHQ 2/9     06/01/2021    1:55 PM 08/01/2020    2:56 PM 06/05/2020    3:01 PM 01/04/2020    2:20 PM 11/15/2019    3:35 PM 11/14/2019    2:04 PM 10/10/2019    1:45 PM  Depression screen PHQ 2/9  Decreased Interest '1 1 1 2 3 1 3  '$ Down, Depressed, Hopeless '1 1 1 3 3 1 3  '$ PHQ - 2 Score '2 2 2 5 6 2 6  '$ Altered sleeping    '3 3  3  '$ Tired, decreased energy    '3 3  3  '$ Change in appetite    '3 3  3  '$ Feeling bad or failure about yourself     '3 3  2  '$ Trouble concentrating    '3 3  3  '$ Moving slowly or fidgety/restless    '3 3  3  '$ Suicidal thoughts    0 3  0  PHQ-9 Score  $'23 27  23     'v$ Review of Systems  Musculoskeletal:  Positive for back pain and gait problem.  All other systems reviewed and are negative.     Objective:   Physical Exam        Assessment & Plan:

## 2021-09-14 NOTE — Progress Notes (Addendum)
Subjective:  Pt reports return of her abdominal pain last night with minimal improvement with the PO dilaudid. She feels her PO dilaudid was too spaced out. She was not able to eat last night or this morning but has had good fluid intake as well as urine output. She reports a BM yesterday with no improvement in her symptoms. She denies any nausea or vomiting.   Objective:  Vital signs in last 24 hours: Vitals:   09/13/21 2040 09/13/21 2255 09/14/21 0017 09/14/21 0341  BP: (!) 194/106 (!) 173/84 (!) 161/115 (!) 148/81  Pulse: 92     Resp: '19  20 17  '$ Temp: 98.9 F (37.2 C)  98.3 F (36.8 C) 97.6 F (36.4 C)  TempSrc: Oral  Oral Oral  SpO2: 100%  97% 99%   Weight change:   Intake/Output Summary (Last 24 hours) at 09/14/2021 0657 Last data filed at 09/14/2021 2297 Gross per 24 hour  Intake 172.3 ml  Output 3200 ml  Net -3027.7 ml   GENERAL: Patient laying in bed, in no acute distress CV: Regular rate and rhythm. No murmurs appreciated PULM: normal respiratory effort. Clear to auscultation GI: Normal bowel sounds. Discomfort with palpation of lower abdomen. MSK: Normal bulk, tone. No pitting edema. SKIN: warm and dry NEURO: awake and oriented x3. Conversation appropriate.  Assessment/Plan:  Principal Problem:   Atypical chest pain Active Problems:   Essential hypertension, benign   Type 2 diabetes mellitus with hyperglycemia, with long-term current use of insulin (HCC)   Diabetic gastroparesis (HCC)   Abdominal pain   PAF (paroxysmal atrial fibrillation) ([place])  56yo female with chronic abdominal pain 2/2 gastroparesis, type II diabetes mellitus, CKD IV, paroxysmal atrial fibrillation, chronic diastolic heart failure, recent admission for acute kidney injury on CKDIV 2/2 ATN/acute pyelonephritis admitted 7/6 for atypical chest pain and acute on chronic abdominal pain now being assessed for pain control.   Acute on chronic abdominal pain Patient recently seen for acute on  chronic abdominal pain likely due to chronic gastroparesis. Has been unable to tolerate Reglan, Bentyl, and Erythromycin for same concern, worsening on home narcotic pain medication. Trialing home dose of dilaudid at '4mg'$  twice a day with Linzess. Pt with large urine output of 3,200 yesterday. Pt reports increased PO intake yesterday. - Continue Zofran PRN - Telemetry to monitor QT given anti-emetics use - Dilaudid '4mg'$  BID readjusted timing to 09:30 and 15:00 - Avoid morphine in the setting of CKD - Viscous lidocaine and maalox  - Start trial of Linzess 145 mcf prior to meals - Trial of Robaxin given patient reporting improvement with same previously - D/C'd Bentyl TID as patient is not taking it.    Hypokalemia Hypomagnesemia Unclear cause, possibly due to decreased PO intake. Mg i1.3 today. Potassium at 3.2 today. - Continued replacement of Mg and K+ - BMP following   Atypical chest pain New-onset, mid-sternal reproducible chest pain acute worsening yesterday that improved throughout the day. Repeat troponins x2 negative. Echo consistent with prior. - Trial of hydroxyzine with significant improvement - Lidocaine patch PRN - Telemetry - Follow-up outpatient cardiology   Type II diabetes mellitus Diabetic neuropathy BG 227 today.  - Semglee 35 units nightly - SSI   Chronic kidney disease IV At discharge last admission sCr 4.58 w/ GFR 11. Tcr 2.57 today and GFR 21 - Continue home sodium bicarbonate tablets '650mg'$  three times daily - Daily BMP - I/O   Anemia of chronic disease Hgb stable from previous admission. No signs  or symptoms of acute bleeding. Will continue to monitor. - Daily CBC   Hypertension BP increased acutely with pain will likely improve with continued pain control.  - Metoprolol succinate '50mg'$  daily   Chronic diastolic heart failure Patient appears euvolemic, no concern for acute heart failure at this time. Will continue with home regimen. Previously on  SGLT2i, now off of this due to renal function. Continue home beta blocker. - Metoprolol succinate '50mg'$  daily   Paroxysmal atrial fibrillation Patient is in sinus rhythm on ECG. Sinus rhythm. Will continue with home medications. - Metoprolol succinate '50mg'$  daily - Eliquis '5mg'$  twice daily   Hyperlipidemia Chronic, stable issue.  - Atorvastatin '10mg'$  daily   BEST PRACTICE    DIET: Regular diet  IVF: n/a DVT PPX: Eliquis BOWEL: senokot-s and miralax CODE: FULL FAM COM: n/a   LOS: 0 days   Shirlyn Goltz, Medical Student 09/14/2021, 6:57 AM

## 2021-09-15 ENCOUNTER — Other Ambulatory Visit (HOSPITAL_COMMUNITY): Payer: Self-pay

## 2021-09-15 DIAGNOSIS — R0789 Other chest pain: Secondary | ICD-10-CM | POA: Diagnosis not present

## 2021-09-15 LAB — BASIC METABOLIC PANEL
Anion gap: 10 (ref 5–15)
BUN: 34 mg/dL — ABNORMAL HIGH (ref 6–20)
CO2: 24 mmol/L (ref 22–32)
Calcium: 9 mg/dL (ref 8.9–10.3)
Chloride: 103 mmol/L (ref 98–111)
Creatinine, Ser: 2.93 mg/dL — ABNORMAL HIGH (ref 0.44–1.00)
GFR, Estimated: 18 mL/min — ABNORMAL LOW (ref >60)
Glucose, Bld: 215 mg/dL — ABNORMAL HIGH (ref 70–99)
Potassium: 3.4 mmol/L — ABNORMAL LOW (ref 3.5–5.1)
Sodium: 137 mmol/L (ref 135–145)

## 2021-09-15 LAB — MAGNESIUM: Magnesium: 2.2 mg/dL (ref 1.7–2.4)

## 2021-09-15 LAB — CBC
HCT: 25.8 % — ABNORMAL LOW (ref 36.0–46.0)
Hemoglobin: 8.2 g/dL — ABNORMAL LOW (ref 12.0–15.0)
MCH: 30.5 pg (ref 26.0–34.0)
MCHC: 31.8 g/dL (ref 30.0–36.0)
MCV: 95.9 fL (ref 80.0–100.0)
Platelets: 359 10*3/uL (ref 150–400)
RBC: 2.69 MIL/uL — ABNORMAL LOW (ref 3.87–5.11)
RDW: 13.5 % (ref 11.5–15.5)
WBC: 10.7 10*3/uL — ABNORMAL HIGH (ref 4.0–10.5)
nRBC: 0 % (ref 0.0–0.2)

## 2021-09-15 LAB — GLUCOSE, CAPILLARY
Glucose-Capillary: 170 mg/dL — ABNORMAL HIGH (ref 70–99)
Glucose-Capillary: 237 mg/dL — ABNORMAL HIGH (ref 70–99)

## 2021-09-15 MED ORDER — LINACLOTIDE 145 MCG PO CAPS
145.0000 ug | ORAL_CAPSULE | Freq: Every day | ORAL | 0 refills | Status: DC
  Filled 2021-09-15: qty 30, 30d supply, fill #0

## 2021-09-15 MED ORDER — POTASSIUM CHLORIDE 10 MEQ/100ML IV SOLN
10.0000 meq | INTRAVENOUS | Status: AC
  Administered 2021-09-15 (×3): 10 meq via INTRAVENOUS
  Filled 2021-09-15 (×3): qty 100

## 2021-09-15 MED ORDER — POLYETHYLENE GLYCOL 3350 17 GM/SCOOP PO POWD
17.0000 g | Freq: Every day | ORAL | 0 refills | Status: DC
  Filled 2021-09-15: qty 238, 14d supply, fill #0

## 2021-09-15 NOTE — Discharge Summary (Addendum)
Name: Deborah [name] MRN: [id number] DOB: Aug 28, 1965 56 y.o. PCP: Cena Benton, MD  Date of Admission: 09/10/2021  3:14 AM Date of Discharge: 09/15/2021 4:00 PM Attending Physician: Dr.  Charise Killian  Discharge Diagnosis: Principal Problem:   Atypical chest pain Active Problems:   Essential hypertension, benign   Type 2 diabetes mellitus with hyperglycemia, with long-term current use of insulin (HCC)   Diabetic gastroparesis (HCC)   Abdominal pain   PAF (paroxysmal atrial fibrillation) ([place])    Discharge Medications: Allergies as of 09/15/2021       Reactions   Diazepam [name]   Gabapentin [name], Swelling   Other reaction(s): Unknown   Iodinated Contrast Media Anaphylaxis   11/29/17 Cardiac arrest 1 min after IV contrast, possible allergy vs vasovagal episode Iopamidol  Anaphylaxis  High 11/28/2017  Patient had seizure like activity and then code post 100 cc of isovue 300     Isovue [iopamidol] Anaphylaxis   11/28/17 Patient had seizure like activity and then 1 min code after 100 cc of isovue 300. Possible contrast allergy vs vasovagal episode   Lisinopril Anaphylaxis   Tongue and mouth swelling   Metoclopramide Other (See Comments)   Tardive dyskinesia   Nsaids Anaphylaxis, Other (See Comments)   ULCER   Penicillins Palpitations   Has patient had a PCN reaction causing immediate rash, facial/tongue/throat swelling, SOB or lightheadedness with hypotension: Yes, heart races Has patient had a PCN reaction causing severe rash involving mucus membranes or skin necrosis: No Has patient had a PCN reaction that required hospitalization: Yes  Has patient had a PCN reaction occurring within the last 10 years: No   Dicyclomine Hcl Other (See Comments)   Reports chest pain   Tolmetin Nausea Only, Other (See Comments)   ULCER   Cyclobenzaprine Other (See Comments)   Unknown   Rifamycins Other (See Comments)   unknown   Acetaminophen  Nausea Only, Other (See Comments)   Irritates stomach ulcer; Abdominal pain   Oxycodone Palpitations   Tramadol Nausea And Vomiting        Medication List     STOP taking these medications    metoCLOPramide 5 MG tablet Commonly known as: REGLAN       TAKE these medications    albuterol (2.5 MG/3ML) 0.083% nebulizer solution Commonly known as: PROVENTIL Take 3 mLs (2.5 mg total) by nebulization every 6 (six) hours as needed for wheezing or shortness of breath.   atorvastatin 10 MG tablet Commonly known as: LIPITOR TAKE ONE TABLET BY MOUTH ONCE DAILY (AM) What changed: See the new instructions.   cetirizine 10 MG tablet Commonly known as: ZYRTEC TAKE 1 TABLET (10 MG TOTAL) BY MOUTH DAILY (AM) What changed: See the new instructions.   DULoxetine HCl 40 MG Cpep TAKE 40 MG BY MOUTH IN THE MORNING AND AT BEDTIME.   Easy Comfort Pen Needles 31G X 5 MM Misc Generic drug: Insulin Pen Needle USE 3 TIMES A DAY FOR INSULIN ADMINISTRATION   Eliquis 5 MG Tabs tablet Generic drug: apixaban TAKE 1 TABLET BY MOUTH 2 (TWO) TIMES DAILY. What changed: how much to take   famotidine 40 MG tablet Commonly known as: PEPCID Take 40 mg by mouth daily.   fluticasone 50 MCG/ACT nasal spray Commonly known as: FLONASE Place 2 sprays into both nostrils daily as needed for allergies or rhinitis. What changed:  how much to take when to take this   folic acid 1 MG tablet Commonly  known as: FOLVITE Take 1 mg by mouth daily.   HumaLOG KwikPen 100 UNIT/ML KwikPen Generic drug: insulin lispro Inject 7 Units into the skin 3 (three) times daily with meals.   hydrALAZINE 50 MG tablet Commonly known as: APRESOLINE Take 50 mg by mouth 3 (three) times daily.   HYDROmorphone 4 MG tablet Commonly known as: DILAUDID Take 1 tablet (4 mg total) by mouth 2 (two) times daily. May take an extra tablet when pain is severe 10 days out of the month . Please fill on 07/31/2021   isosorbide  mononitrate 60 MG 24 hr tablet Commonly known as: IMDUR TAKE 1 TABLET (60 MG TOTAL) BY MOUTH DAILY (AM) What changed: See the new instructions.   Lantus SoloStar 100 UNIT/ML Solostar Pen Generic drug: insulin glargine Inject 35 Units into the skin at bedtime.   linaclotide 145 MCG Caps capsule Commonly known as: LINZESS Take 1 capsule (145 mcg total) by mouth daily before breakfast.   meclizine 25 MG tablet Commonly known as: ANTIVERT Take 1 tablet (25 mg total) by mouth 3 (three) times daily as needed for dizziness.   methocarbamol 750 MG tablet Commonly known as: ROBAXIN Take 1 tablet (750 mg total) by mouth every 8 (eight) hours.   metoprolol succinate 50 MG 24 hr tablet Commonly known as: TOPROL-XL Take 1 tablet (50 mg total) by mouth daily. Take with or immediately following a meal.   ondansetron 4 MG disintegrating tablet Commonly known as: Zofran ODT Take 1 tablet (4 mg total) by mouth every 8 (eight) hours as needed for nausea or vomiting.   pantoprazole 40 MG tablet Commonly known as: PROTONIX TAKE 1 TABLET (40 MG TOTAL) BY MOUTH 2 (TWO) TIMES DAILY. (AM+BEDTIME) What changed: See the new instructions.   polyethylene glycol 17 g packet Commonly known as: MIRALAX / GLYCOLAX Take 17 g by mouth daily. Start taking on: September 16, 2021   promethazine 25 MG tablet Commonly known as: PHENERGAN Take 1 tablet (25 mg total) by mouth every 6 (six) hours as needed for nausea or vomiting.   Senna Plus 8.6-50 MG tablet Generic drug: senna-docusate Take 1 tablet by mouth 2 (two) times daily.   sodium bicarbonate 650 MG tablet Take 1 tablet (650 mg total) by mouth 2 (two) times daily.   spironolactone 25 MG tablet Commonly known as: ALDACTONE Take 12.5 mg by mouth daily.   torsemide 20 MG tablet Commonly known as: DEMADEX Take 20 mg by mouth daily as needed (fluid retention).   V-R GAS RELIEF 80 MG chewable tablet Generic drug: simethicone Chew 1 tablet (80 mg  total) by mouth every 6 (six) hours as needed for up to 14 days for flatulence.   Vitamin D (Ergocalciferol) 1.25 MG (50000 UNIT) Caps capsule Commonly known as: DRISDOL Take 50,000 Units by mouth every Monday.        Disposition and follow-up:   Ms.Abiageal ZOEJANE GAULIN was discharged from [facility] in [place] condition.  At the hospital follow up visit please address:  1.  Follow-up: Chronic abdominal pain DM gastroparesis Constipation No acute source of pain identified. Chronic pain likely worsened in the setting of chronic gastroparesis and constipation.  - Assess pain on home Dilaudid  dose '4mg'$  BID, Duloxetine 40 mg BID - Reevaluate adding muscle relaxant and Linzess to regular regimen - Avoid morphine and nephrotoxic pain medications in the setting of CKD - Evaluate monitor bowel movements  Electrolyte abnormalities - Monitor for electrolyte abnormalities, especially K and Mg  CKD IV - stable. Avoid nephrotoxic medications - Assess for signs of uremia  HTN - SBP variability with acute pain crises - Monitor BP  Chronic diastolic HF - Assess for signs and symptoms of exacerbation - Monitor medications and assess need for diuresis  Anemia - Monitor CBC  Type II DM -Monitor BG and manage DM medications  2.  Labs / imaging needed at time of follow-up: cbc, bmp  3.  Pending labs/ test needing follow-up: none  4.  Medication Changes  Added Linzess, for constipation  Follow-up Appointments: PM&R, Danella Sensing NP, 09/16/21 @ [place] by problem list: 56yo female with chronic abdominal pain 2/2 gastroparesis, type II diabetes mellitus, CKD IV, paroxysmal atrial fibrillation, chronic diastolic heart failure, recent admission for acute kidney injury on CKDIV 2/2 ATN/acute pyelonephritis admitted 7/6 for atypical chest pain, managed for acute on chronic abdominal pain   Chronic abdominal pain DM gastroparesis Constipation Patient  presented with suspected acute on chronic abdominal pain. Pain was described as a sharp substernal and mid abdominal areas that she reported differs from her chronic back spasms and bilateral lower abdominal pain. Patient evaluated for cardiac ischemia during this hospitalization, with EKG and troponin during episodes, unremarkable. Non-con CT abdomen pelvis and renal US showed small nephrolithiasis with no acute process. Avoided IV contrast during this admission as patient is recovering from ATN after previous hospitalization (08/2021). Ultimately this was suspected to be worsening of chronic pain, likely secondary to diabetic gastroparesis, worsened by chronic constipation. Pt unable to tolerate Reglan, Bentyl, and Erythromycin in the past. Pain was accompanied by high blood pressure and significant distress. Initially, pain was managed with IV dilaudid, morphine, but given CKD and Cr, transitioned to oxycodone, Robaxin, and PO Dilaudid for pain control. Discussed with patient the risk of worsening of her symptoms with continued use of narcotics. Pt was also treated with Zofran, viscous lidocaine and Maalox. Largely avoided IV narcotics given concern for worsening constipation and in an effort to keep her closer to her home pain medications. Added Linzess trial with two doses taken in hospital, along with Senna and Miralax for constipation. For the past two days, patient has received her home Dilaudid dose of 4 mg BID, with PRN 2 mg Dilaudid for breakthrough pain at night. Patient still endorses discomfort this am, but was able to tolerate PO intake, passed 1x BM during this hospitalization, and is stable for discharge from the hospital with close follow up with her pain specialist at [facility]. Regarding this pain, patient seems scared and mistrustful of care teams. There is also a component of anxiety that aggravates the pain. Recommend close monitoring at outpatient pain clinic for continuity of care, to which  the patient responds well, and for re-assessment of pain regimen. We discussed the potential for outpatient CTA of abdomen to rule out chronic mesenteric ischemia given comorbidities, although suspicion is lower given pain pattern and risk vs benefit given CKD may not be favorable. Team spoke with Danella Sensing, NP to discuss the case and  who will see the patient in the clinic on 7/12.   Atypical Chest Pain Mid sternal reproducible chest pain. Initial trops elevated to 33>29, pt developed acute episode of chest pain again inpatient with negative trops. No significant ischemic changes noted on EKG. Echo unchanged from previous. Pt remained on continuous telemetry. Pt to follow up with outpatient Cardiology.  Electrolyte abnormalities Pt's potassium low at 2.8 and magnesium low of 1.1. Replete throughout hospital course  and are currently within normal range.  CKD IV At discharge last admission sCr 4.58 w/ GFR 11. Tcr 2.93 today and GFR 18. We continued home sodium bicarbonate tablets '650mg'$  three times daily with close kidney function monitoring through daily BMPs and ins and outs.  HTN Elevated SBPs with acute pain and improved with pain management. Continued on home medications  Chronic diastolic HF Patient remained euvolemic, with no concerns for acute hear failure event. She was continued on her home regimen throughout hospital  Anemia Hemoglobin was stable at 8 from previous admission. Monitored daily with no signs or symptoms of acute bleeding.  Type II DM Diabetic neuropathy Glucose monitored regularly. Pt on sliding scare insulin and Semglee 35 units nightly.   Paroxysmal atrial fibrillation Pt remained in sinus rhythm on ECG. Continued home Metoprolol succinate '50mg'$  and Eliquis 5 mg BID.  HLD Pt with chronic HLD, stable issue. Remained on home atorvastatin '10mg'$ .    Discharge Subjective: Feeling uncomfortable and unsure about leaving the hospital. She is scared she will need to  come back tonight. Tolerating PO intake. No BM last night.   Discharge Exam:   BP 113/68   Pulse 85   Temp (!) 97.5 F (36.4 C) (Oral)   Resp 18   LMP 10/10/2012   SpO2 98%  Constitutional: chronically ill-appearing woman sitting in bed, in some distress Cardiovascular: regular rate and rhythm, no m/r/g Pulmonary/Chest: normal work of breathing on room air, lungs clear to auscultation bilaterally Abdominal: soft, non-distended, tender to deep palpation in the lower abdomen bilaterally MSK: normal bulk and tone, no lower extremity edema Neurological: alert & oriented x 3 Skin: warm and dry Psych: sad mood and labile affect  Pertinent Labs, Studies, and Procedures:     Latest Ref Rng & Units 09/15/2021    4:16 AM 09/14/2021    1:05 AM 09/13/2021    1:14 AM  CBC  WBC 4.0 - 10.5 K/uL 10.7  12.3  8.3   Hemoglobin 12.0 - 15.0 g/dL 8.2  8.8  8.2   Hematocrit 36.0 - 46.0 % 25.8  27.4  27.2   Platelets 150 - 400 K/uL 359  384  381        Latest Ref Rng & Units 09/15/2021    4:16 AM 09/14/2021    1:22 PM 09/14/2021    1:05 AM  CMP  Glucose 70 - 99 mg/dL 215  242  212   BUN 6 - 20 mg/dL 34  27  27   Creatinine 0.44 - 1.00 mg/dL 2.93  2.57  2.26   Sodium 135 - 145 mmol/L 137  139  137   Potassium 3.5 - 5.1 mmol/L 3.4  3.9  3.2   Chloride 98 - 111 mmol/L 103  101  102   CO2 22 - 32 mmol/L '24  23  22   '$ Calcium 8.9 - 10.3 mg/dL 9.0  9.5  9.1     ECHOCARDIOGRAM COMPLETE  Result Date: 09/11/2021    ECHOCARDIOGRAM REPORT   Patient Name:   DEMARIE UHLIG Date of Exam: 09/11/2021 Medical Rec #:  [id number]            Height:       66.0 in Accession #:    [id number]           Weight:       292.8 lb Date of Birth:  05/23/1965            BSA:  2.351 m Patient Age:    11 years             BP:           166/106 mmHg Patient Gender: F                    HR:           101 bpm. Exam Location:  Inpatient Procedure: 2D Echo, Color Doppler and Cardiac Doppler Indications:    R07.9* Chest pain,  unspecified  History:        Patient has prior history of Echocardiogram examinations, most                 recent 11/21/2020. CHF, Arrythmias:Atrial Fibrillation; Risk                 Factors:Hypertension, Diabetes and Dyslipidemia.  Sonographer:    Raquel Sarna Senior RDCS Referring Phys: [place]  1. Left ventricular ejection fraction, by estimation, is 50 to 55%. The left ventricle has low normal function. The left ventricle has no regional wall motion abnormalities. There is moderate left ventricular hypertrophy. Left ventricular diastolic parameters are indeterminate.  2. Right ventricle was not well visualized but grossly normal size and systolic function. There is normal pulmonary artery systolic pressure. The estimated right ventricular systolic pressure is 00.1 mmHg.  3. The mitral valve is normal in structure. Trivial mitral valve regurgitation.  4. The aortic valve is tricuspid. Aortic valve regurgitation is not visualized. Aortic valve sclerosis is present, with no evidence of aortic valve stenosis.  5. The inferior vena cava is normal in size with greater than 50% respiratory variability, suggesting right atrial pressure of 3 mmHg. FINDINGS  Left Ventricle: Left ventricular ejection fraction, by estimation, is 50 to 55%. The left ventricle has low normal function. The left ventricle has no regional wall motion abnormalities. The left ventricular internal cavity size was normal in size. There is moderate left ventricular hypertrophy. Left ventricular diastolic parameters are indeterminate. Right Ventricle: The right ventricular size is not well visualized. Right vetricular wall thickness was not well visualized. Right ventricular systolic function was not well visualized. There is normal pulmonary artery systolic pressure. The tricuspid regurgitant velocity is 2.69 m/s, and with an assumed right atrial pressure of 3 mmHg, the estimated right ventricular systolic pressure is 74.9 mmHg.  Left Atrium: Left atrial size was normal in size. Right Atrium: Right atrial size was normal in size. Pericardium: There is no evidence of pericardial effusion. Mitral Valve: The mitral valve is normal in structure. Trivial mitral valve regurgitation. Tricuspid Valve: The tricuspid valve is normal in structure. Tricuspid valve regurgitation is trivial. Aortic Valve: The aortic valve is tricuspid. Aortic valve regurgitation is not visualized. Aortic valve sclerosis is present, with no evidence of aortic valve stenosis. Pulmonic Valve: The pulmonic valve was not well visualized. Pulmonic valve regurgitation is not visualized. Aorta: The aortic root and ascending aorta are structurally normal, with no evidence of dilitation. Venous: The inferior vena cava is normal in size with greater than 50% respiratory variability, suggesting right atrial pressure of 3 mmHg. IAS/Shunts: The interatrial septum was not well visualized.  LEFT VENTRICLE PLAX 2D LVIDd:         4.90 cm   Diastology LVIDs:         3.50 cm   LV e' medial:    12.00 cm/s LV PW:         1.00 cm  LV E/e' medial:  10.0 LV IVS:        0.90 cm   LV e' lateral:   6.22 cm/s LVOT diam:     2.25 cm   LV E/e' lateral: 19.3 LV SV:         72 LV SV Index:   31 LVOT Area:     3.98 cm  RIGHT VENTRICLE RV S prime:     11.80 cm/s TAPSE (M-mode): 2.3 cm LEFT ATRIUM             Index        RIGHT ATRIUM           Index LA diam:        4.60 cm 1.96 cm/m   RA Area:     14.20 cm LA Vol (A2C):   53.9 ml 22.92 ml/m  RA Volume:   30.60 ml  13.01 ml/m LA Vol (A4C):   74.6 ml 31.73 ml/m LA Biplane Vol: 64.0 ml 27.22 ml/m  AORTIC VALVE LVOT Vmax:   86.30 cm/s LVOT Vmean:  66.700 cm/s LVOT VTI:    0.182 m  AORTA Ao Root diam: 3.30 cm Ao Asc diam:  3.40 cm MITRAL VALVE                TRICUSPID VALVE MV Area (PHT): 4.63 cm     TR Peak grad:   28.9 mmHg MV Decel Time: 164 msec     TR Vmax:        269.00 cm/s MV E velocity: 120.00 cm/s                             SHUNTS                              Systemic VTI:  0.18 m                             Systemic Diam: 2.25 cm Oswaldo Milian MD Electronically signed by Oswaldo Milian MD Signature Date/Time: 09/11/2021/7:21:49 PM    Final    CT ABDOMEN PELVIS WO CONTRAST  Result Date: 09/10/2021 CLINICAL DATA:  Abdominal pain EXAM: CT ABDOMEN AND PELVIS WITHOUT CONTRAST TECHNIQUE: Multidetector CT imaging of the abdomen and pelvis was performed following the standard protocol without IV contrast. RADIATION DOSE REDUCTION: This exam was performed according to the departmental dose-optimization program which includes automated exposure control, adjustment of the mA and/or kV according to patient size and/or use of iterative reconstruction technique. COMPARISON:  CT abdomen and pelvis 08/18/2021 FINDINGS: Lower chest: No acute abnormality. Hepatobiliary: Liver is mildly enlarged measuring 18.8 cm in length. No focal hepatic mass identified. Gallbladder is surgically absent. No biliary ductal dilatation. Pancreas: Unremarkable. No pancreatic ductal dilatation or surrounding inflammatory changes. Spleen: Normal in size without focal abnormality. Adrenals/Urinary Tract: Adrenal glands appear normal. A few tiny calculi in the lower pole left kidney measuring up to 2 mm in size. No hydronephrosis. Urinary bladder appears normal. Stomach/Bowel: Stomach is within normal limits. Appendix appears normal. No evidence of bowel wall thickening, distention, or inflammatory changes. Vascular/Lymphatic: No significant vascular findings are present. No enlarged abdominal or pelvic lymph nodes. Reproductive: Uterus and bilateral adnexa are unremarkable. Other: No ascites. Musculoskeletal: Degenerative changes in the lumbar spine. No suspicious bony lesions identified. IMPRESSION: 1. No acute process identified. 2. Small  left nephrolithiasis. 3. Mild hepatomegaly. Electronically Signed   By: Ofilia Neas M.D.   On: 09/10/2021 10:00   DG Chest 2  View  Result Date: 09/10/2021 CLINICAL DATA:  Chest pain and shortness of breath, initial encounter EXAM: CHEST - 2 VIEW COMPARISON:  08/22/2021 FINDINGS: The heart size and mediastinal contours are within normal limits. Both lungs are clear. The visualized skeletal structures are unremarkable. IMPRESSION: No active cardiopulmonary disease. Electronically Signed   By: Inez Catalina M.D.   On: 09/10/2021 03:44       Signed: Romana Juniper, MD 09/15/2021, 5:27 PM   Pager: [phone]

## 2021-09-15 NOTE — Progress Notes (Signed)
RN attempting to give morning daily medications. Pt at first refused, wanting to wait for breakfast. When breakfast arrived around 1000, pt felt nauseous and fell back asleep. RN attempted again around 1150 but pt stated she felt nauseous, denied PRN med for the nausea, and requested to try again in 30 minutes. A Careers information officer attempted again to give AM meds at 1300 to which pt again stated she was too nauseous but this time requested PO Zofran - requested from pharmacy. Will continue to attempt to give daily AM meds before expected discharge.

## 2021-09-15 NOTE — Inpatient Diabetes Management (Signed)
Inpatient Diabetes Program Recommendations  AACE/ADA: New Consensus Statement on Inpatient Glycemic Control (2015)  Target Ranges:  Prepandial:   less than 140 mg/dL      Peak postprandial:   less than 180 mg/dL (1-2 hours)      Critically ill patients:  140 - 180 mg/dL   Lab Results  Component Value Date   GLUCAP 170 (H) 09/15/2021   HGBA1C 7.5 (H) 08/16/2021    Review of Glycemic Control  Latest Reference Range & Units 09/14/21 07:43 09/14/21 11:36 09/14/21 17:15 09/14/21 21:40 09/15/21 07:40  Glucose-Capillary 70 - 99 mg/dL 179 (H) 227 (H) 309 (H) 310 (H) 170 (H)  (H): Data is abnormally high  Diabetes history: DM2 Outpatient Diabetes medications: Lantus 50 units QHS, Novolog 30 units QID Current orders for Inpatient glycemic control: Semglee 35 units QHS, Novolog 0-15 units TID with meals, Novolog 3 units TID with meals  Inpatient Diabetes Program Recommendations:   Please consider: -Novolog 5 units tid meal coverage if eats 50%  Thank you, Bethena Roys E. Alisha Burgo, RN, MSN, CDE  Diabetes Coordinator Inpatient Glycemic Control Team Team Pager [phone] (8am-5pm) 09/15/2021 11:07 AM

## 2021-09-15 NOTE — Discharge Instructions (Signed)
  Ms. Boulos,   You came in with abdominal pain, chest pain, throat pain, and decreased oral intake. We found you to be low in two electrolytes (Magnesium and Potassium) that are important for your bodily functions.  We looked at your heart with an echocardiogram, chest X-ray, and we also completed a blood test (troponin) to look for any heart tissue death that all returned normal. We looked at your abdomen with a CT abdomen pelvis as well as a renal ultrasound that also returned normal.   Initially, we treated your chest pain with lidocaine patches and your abdominal pain with pain medications, anti nausea medication, and bowel medications to help with your constipation. We also treated your throat pain with a lidocaine mouthwash. We gave you medications to replete your low potassium and low magnesium and are now back to normal range.   We transitioned you back to your home pain medications, continued the bowel medications for constipation, and added a gas pain medication.   We have scheduled an appointment with your pain doctor tomorrow Wednesday, July 12 at 10:30AM. Its important that you follow up with you Primary care provider for your other medical conditions.   We also want you to continue to take the gas pain medication (Linzess) and the constipation medications (Miralax and Senna) to help your bowel movements to become more regular. We would like you to have a bowel movement every day if not every other day.  Take care, [facility] inpatient team

## 2021-09-16 ENCOUNTER — Encounter: Payer: Self-pay | Admitting: Registered Nurse

## 2021-09-16 ENCOUNTER — Encounter: Payer: Medicare Other | Attending: Registered Nurse | Admitting: Registered Nurse

## 2021-09-16 VITALS — BP 113/75 | HR 81 | Ht 66.0 in

## 2021-09-16 DIAGNOSIS — C7A010 Malignant carcinoid tumor of the duodenum: Secondary | ICD-10-CM | POA: Diagnosis present

## 2021-09-16 DIAGNOSIS — Z5181 Encounter for therapeutic drug level monitoring: Secondary | ICD-10-CM | POA: Insufficient documentation

## 2021-09-16 DIAGNOSIS — G894 Chronic pain syndrome: Secondary | ICD-10-CM | POA: Insufficient documentation

## 2021-09-16 DIAGNOSIS — Z79891 Long term (current) use of opiate analgesic: Secondary | ICD-10-CM | POA: Diagnosis present

## 2021-09-16 DIAGNOSIS — G893 Neoplasm related pain (acute) (chronic): Secondary | ICD-10-CM | POA: Diagnosis present

## 2021-09-16 MED ORDER — HYDROMORPHONE HCL 4 MG PO TABS: 4.0000 mg | ORAL_TABLET | Freq: Two times a day (BID) | ORAL | 0 refills | Status: DC

## 2021-09-16 NOTE — Progress Notes (Signed)
Subjective:    Patient ID: Deborah [name], female    DOB: December 19, 1965, 56 y.o.   MRN: [id number]  HPI: Deborah [name] is a 57 y.o. female who returns for follow up appointment for chronic pain and medication refill. She states her pain is located in her abdomen. She rates her pain 9. She is not following a exercise regimen, she states she is usually in her wheelchair.  Deborah [name] was admitted to [facility] on 09/10/2021- 09/15/2021, she was admitted with Atypical Chest Pain, discharge summary was reviewed.   Deborah [name] Morphine Equivalent  is 28.67 MME. Last UDS was Performed on 06/02/2021, it was consistent.   Pain Inventory Average Pain 10 Pain Right Now 9 My pain is stabbing  In the last 24 hours, has pain interfered with the following? General activity 10 Relation with others 10 Enjoyment of life 10 What TIME of day is your pain at its worst? morning , daytime, evening, and night Sleep (in general) Poor  Pain is worse with: unsure Pain improves with: medication Relief from Meds: 8  Family History  Problem Relation Age of Onset   Diabetes Mother    Diabetes Father    Heart disease Father    Diabetes Sister    Congestive Heart Failure Sister 44   Diabetes Brother    Social History   Socioeconomic History   Marital status: Married    Spouse name: Not on file   Number of children: Not on file   Years of education: Not on file   Highest education level: Not on file  Occupational History   Not on file  Tobacco Use   Smoking status: Never   Smokeless tobacco: Never  Vaping Use   Vaping Use: Never used  Substance and Sexual Activity   Alcohol use: No   Drug use: No   Sexual activity: Not Currently    Birth control/protection: None  Other Topics Concern   Not on file  Social History Narrative   ** Merged History Encounter **       Social Determinants of Health   Financial Resource Strain: Not on file  Food Insecurity: Not on file   Transportation Needs: Not on file  Physical Activity: Not on file  Stress: Not on file  Social Connections: Not on file   Past Surgical History:  Procedure Laterality Date   BIOPSY  07/27/2019   Procedure: BIOPSY;  Surgeon: Clarene Essex, MD;  [facility];  Service: Endoscopy;;   BIOPSY  07/30/2019   Procedure: BIOPSY;  Surgeon: Otis Brace, MD;  [facility];  Service: Gastroenterology;;   CATARACT EXTRACTION  01/2014   CHOLECYSTECTOMY     COLONOSCOPY WITH PROPOFOL N/A 07/30/2019   Procedure: COLONOSCOPY WITH PROPOFOL;  Surgeon: Otis Brace, MD;  [facility];  Service: Gastroenterology;  Laterality: N/A;   ESOPHAGOGASTRODUODENOSCOPY N/A 07/27/2019   Procedure: ESOPHAGOGASTRODUODENOSCOPY (EGD);  Surgeon: Clarene Essex, MD;  [facility];  Service: Endoscopy;  Laterality: N/A;   ESOPHAGOGASTRODUODENOSCOPY N/A 07/26/2020   Procedure: ESOPHAGOGASTRODUODENOSCOPY (EGD);  Surgeon: Arta Silence, MD;  Location[facility];  Service: Endoscopy;  Laterality: N/A;   ESOPHAGOGASTRODUODENOSCOPY (EGD) WITH PROPOFOL N/A 08/02/2019   Procedure: ESOPHAGOGASTRODUODENOSCOPY (EGD) WITH PROPOFOL;  Surgeon: Otis Brace, MD;  [facility];  Service: Gastroenterology;  Laterality: N/A;   HEMOSTASIS CLIP PLACEMENT  08/02/2019   Procedure: HEMOSTASIS CLIP PLACEMENT;  Surgeon: Otis Brace, MD;  [facility];  Service: Gastroenterology;;   POLYPECTOMY  07/30/2019  Procedure: POLYPECTOMY;  Surgeon: Otis Brace, MD;  [facility];  Service: Gastroenterology;;   POLYPECTOMY  08/02/2019   Procedure: POLYPECTOMY;  Surgeon: Otis Brace, MD;  Location: [facility];  Service: Gastroenterology;;   Past Surgical History:  Procedure Laterality Date   BIOPSY  07/27/2019   Procedure: BIOPSY;  Surgeon: Clarene Essex, MD;  [facility];  Service: Endoscopy;;   BIOPSY  07/30/2019   Procedure: BIOPSY;  Surgeon:  Otis Brace, MD;  [facility];  Service: Gastroenterology;;   CATARACT EXTRACTION  01/2014   CHOLECYSTECTOMY     COLONOSCOPY WITH PROPOFOL N/A 07/30/2019   Procedure: COLONOSCOPY WITH PROPOFOL;  Surgeon: Otis Brace, MD;  [facility];  Service: Gastroenterology;  Laterality: N/A;   ESOPHAGOGASTRODUODENOSCOPY N/A 07/27/2019   Procedure: ESOPHAGOGASTRODUODENOSCOPY (EGD);  Surgeon: Clarene Essex, MD;  Location[facility];  Service: Endoscopy;  Laterality: N/A;   ESOPHAGOGASTRODUODENOSCOPY N/A 07/26/2020   Procedure: ESOPHAGOGASTRODUODENOSCOPY (EGD);  Surgeon: Arta Silence, MD;  Location[facility];  Service: Endoscopy;  Laterality: N/A;   ESOPHAGOGASTRODUODENOSCOPY (EGD) WITH PROPOFOL N/A 08/02/2019   Procedure: ESOPHAGOGASTRODUODENOSCOPY (EGD) WITH PROPOFOL;  Surgeon: Otis Brace, MD;  [facility];  Service: Gastroenterology;  Laterality: N/A;   HEMOSTASIS CLIP PLACEMENT  08/02/2019   Procedure: HEMOSTASIS CLIP PLACEMENT;  Surgeon: Otis Brace, MD;  [facility];  Service: Gastroenterology;;   POLYPECTOMY  07/30/2019   Procedure: POLYPECTOMY;  Surgeon: Otis Brace, MD;  [facility];  Service: Gastroenterology;;   POLYPECTOMY  08/02/2019   Procedure: POLYPECTOMY;  Surgeon: Otis Brace, MD;  [facility];  Service: Gastroenterology;;   Past Medical History:  Diagnosis Date   Acute back pain with sciatica, left    Acute back pain with sciatica, right    AKI (acute kidney injury) ([place])    Anemia, unspecified    Cancer ([place])    Carcinoid tumor of duodenum    Chest pain with normal coronary angiography 2019   Chronic kidney disease, stage 3b ([place])    Chronic pain    Chronic systolic CHF (congestive heart failure) ([place])    Diabetes mellitus    DKA (diabetic ketoacidosis) ([place])    Drug-seeking behavior    21 hospitalizations and 14 CT a/p in 2 years for N/V and abdominal pain, demanding only IV  dilaudid   Elevated troponin    chronic   Esophageal reflux    Fibromyalgia    Gastric ulcer    Gastroparesis    Gout    Hyperlipidemia    Hypertension    Hypokalemia    Hypomagnesemia    Lumbosacral stenosis    LVH (left ventricular hypertrophy)    Morbid obesity (HCC)    NICM (nonischemic cardiomyopathy) (HCC)    PAF (paroxysmal atrial fibrillation) ([place])    Stroke ([place]) 02/2011   Thrombocytosis    Vitamin B12 deficiency anemia    BP 113/75   Pulse 81   Ht '5\' 6"'$  (1.676 m)   LMP 10/10/2012   SpO2 100%   BMI 47.25 kg/m   Opioid Risk Score:   Fall Risk Score:  `1  Depression screen PHQ 2/9     09/16/2021    9:16 AM 06/01/2021    1:55 PM 08/01/2020    2:56 PM 06/05/2020    3:01 PM 01/04/2020    2:20 PM 11/15/2019    3:35 PM 11/14/2019    2:04 PM  Depression screen PHQ 2/9  Decreased Interest '3 1 1 1 2 3 1  '$ Down, Depressed, Hopeless  $'3 1 1 1 3 3 1  'j$ PHQ - 2 Score '6 2 2 2 5 6 2  '$ Altered sleeping     3 3   Tired, decreased energy     3 3   Change in appetite     3 3   Feeling bad or failure about yourself      3 3   Trouble concentrating     3 3   Moving slowly or fidgety/restless     3 3   Suicidal thoughts     0 3   PHQ-9 Score     23 27      Review of Systems  Constitutional: Negative.   HENT: Negative.    Eyes: Negative.   Respiratory: Negative.    Cardiovascular:  Positive for chest pain.  Gastrointestinal:  Positive for abdominal pain.  Endocrine: Negative.   Genitourinary: Negative.   Musculoskeletal: Negative.   Skin: Negative.   Allergic/Immunologic: Negative.   Neurological: Negative.   Hematological: Negative.   Psychiatric/Behavioral:  Positive for dysphoric mood and sleep disturbance.       Objective:   Physical Exam Vitals and nursing note reviewed.  Constitutional:      Appearance: Normal appearance.  Cardiovascular:     Rate and Rhythm: Normal rate and regular rhythm.     Pulses: Normal pulses.     Heart sounds: Normal heart sounds.   Pulmonary:     Effort: Pulmonary effort is normal.     Breath sounds: Normal breath sounds.  Musculoskeletal:     Cervical back: Normal range of motion and neck supple.     Comments: Normal Muscle Bulk and Muscle Testing Reveals:  Upper Extremities:Decreased  ROM and Muscle Strength 5/5  Lumbar Paraspinal Tenderness: L-4-L-5 Bilateral Greater Trochanter Tenderness Lower Extremities: Decreased ROM and Muscle Strength 5/5 Bilateral Lower Extremities Flexion Produces Pain into her lower Extremities Arrived in wheelchair      Skin:    General: Skin is warm and dry.  Neurological:     Mental Status: She is alert and oriented to person, place, and time.  Psychiatric:        Mood and Affect: Mood normal.        Behavior: Behavior normal.         Assessment & Plan:  1. 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 09/16/2021 2. Chronic Pain Syndrome: Refilled Dilaudid 4 mg one tablet twice a day as needed for sever pain., may take a extra tablet 10 days out of the month when pain is sever.  #70. We will continue the opioid monitoring program, this consists of regular clinic visits, examinations, urine drug screen, pill counts as well as use of [place] Controlled Substance Reporting system. A 12 month History has been reviewed on the [place] Controlled Substance Reporting System on 09/16/2021. 3. Chronic Bilateral Lower Back Pain without sciatica: Continue HEP as Tolerated. Continue to Monitor. 09/16/2021   F/U in 2 months

## 2021-09-27 IMAGING — CR DG CHEST 2V
2 series · 2 of 2 positions shown · non-contrast
Comparison: 07/21/2019

CLINICAL DATA: Chest pain

EXAM:
CHEST - 2 VIEW

[w chest lat]
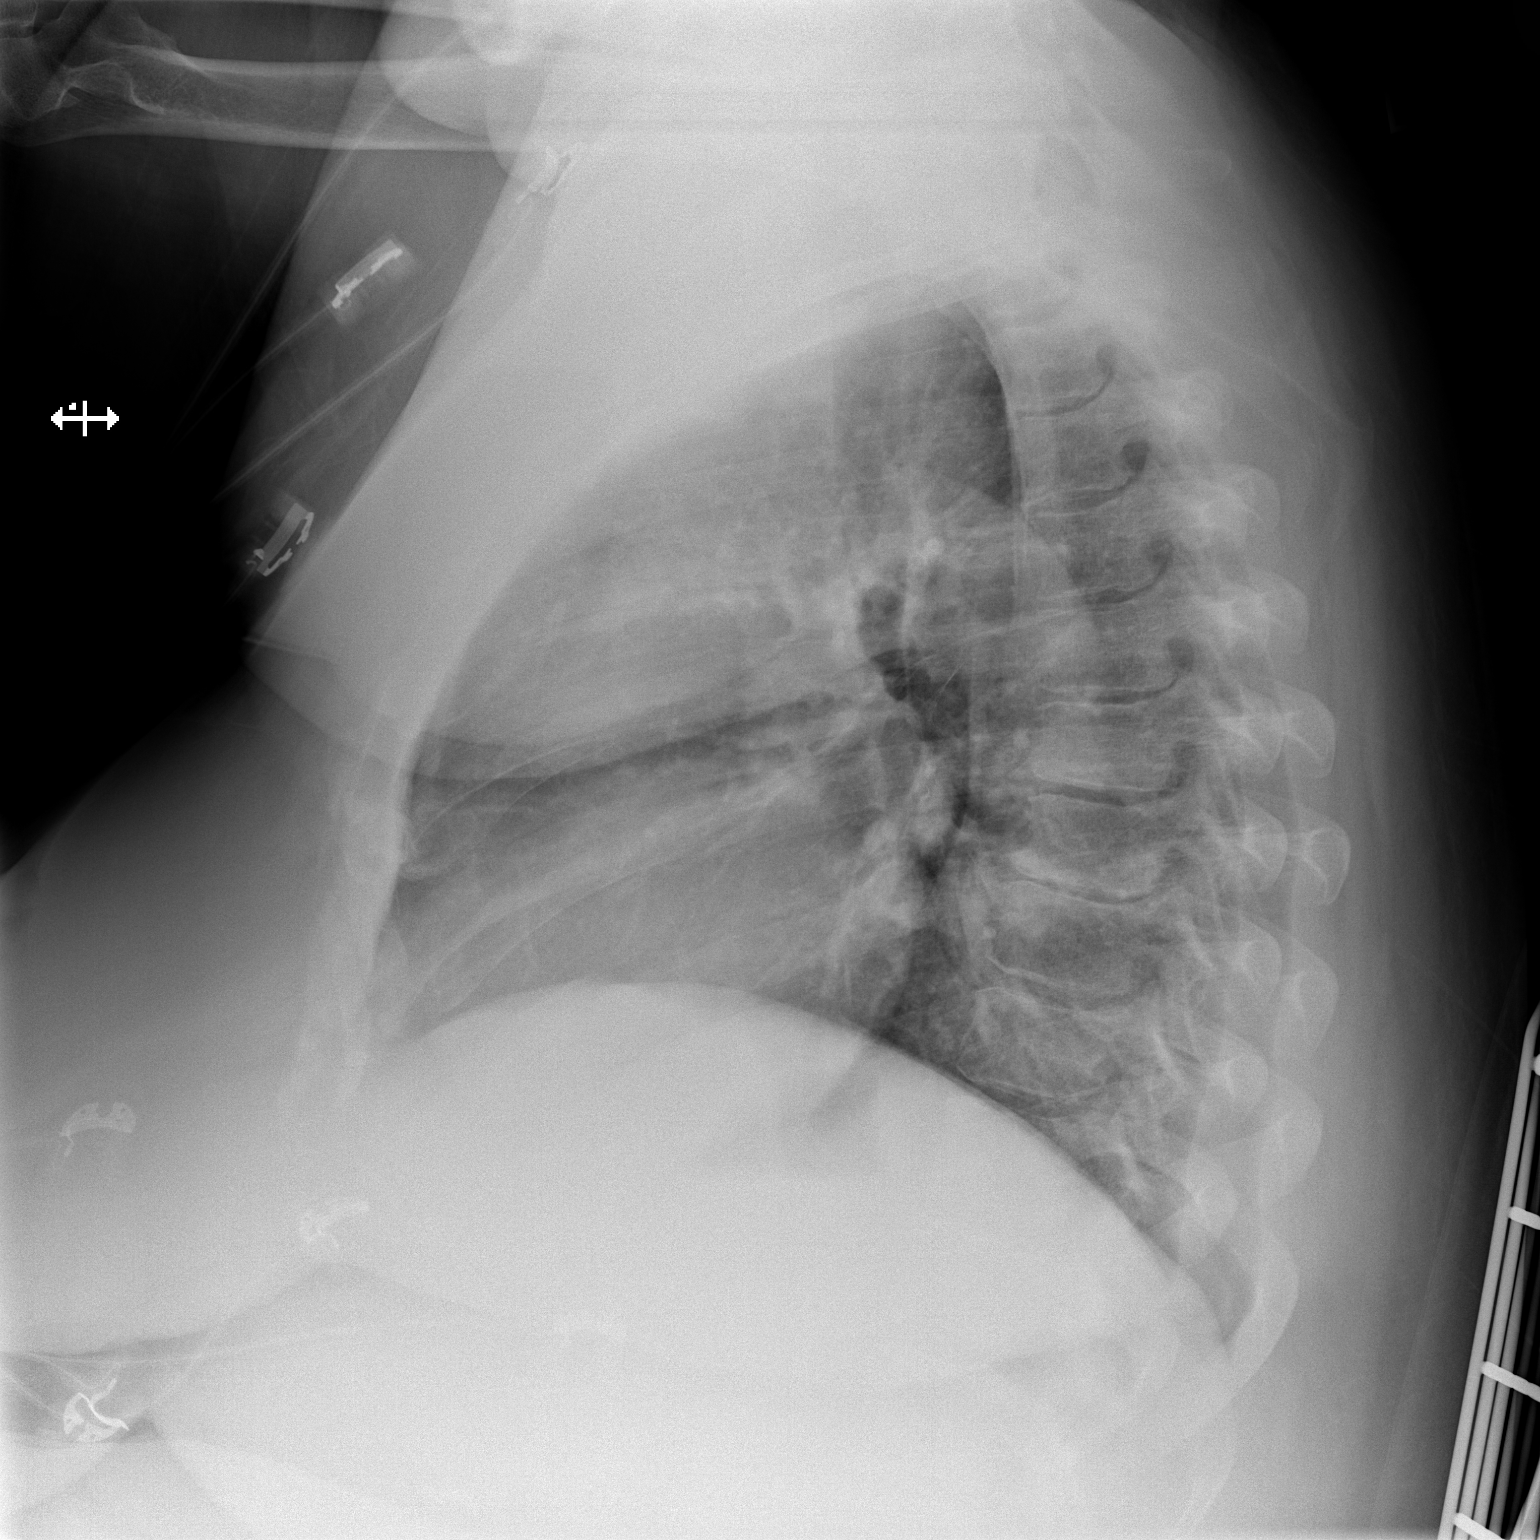

[x chest ap]
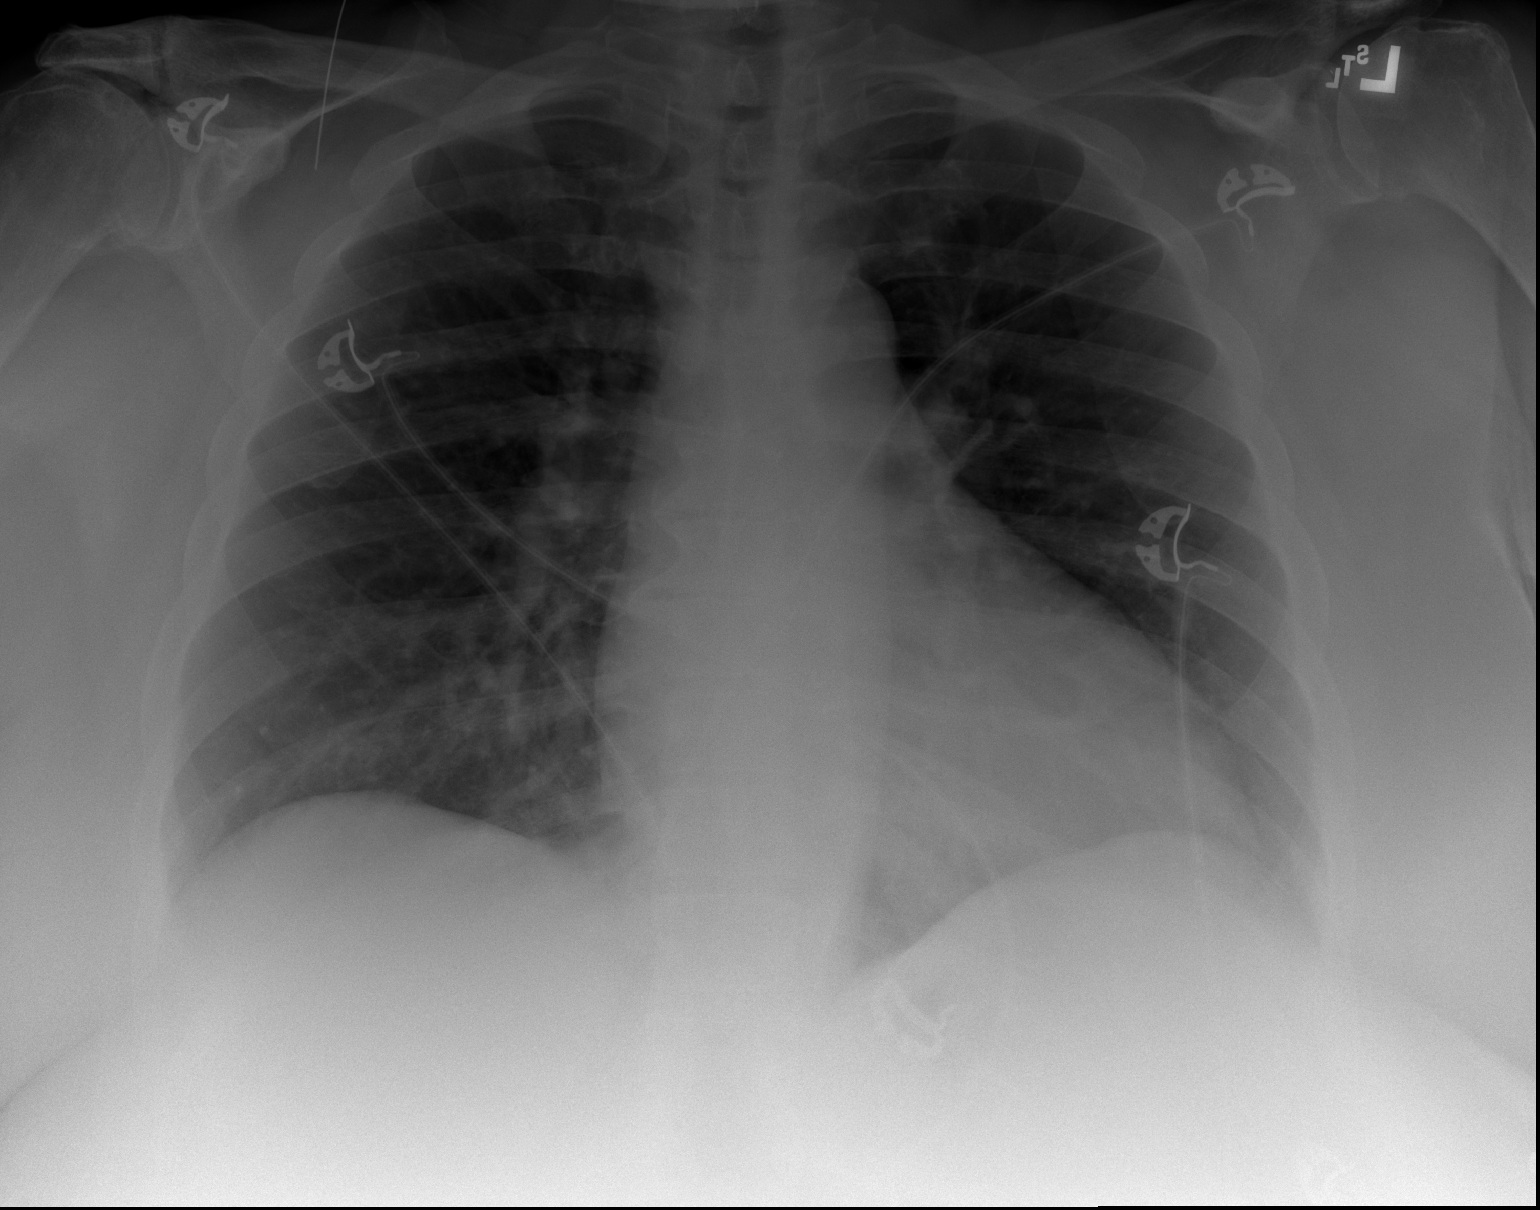

[2 of 2 positions shown; findings below may reference images not displayed]

FINDINGS: Cardiomegaly. Both lungs are clear. Disc degenerative disease of the
thoracic spine.
IMPRESSION: Cardiomegaly without acute abnormality of the lungs.

## 2021-10-01 IMAGING — CT CT ABD-PELV W/O CM
2 of 3 series · 13 of 36 positions shown, 19 images · non-contrast
Comparison: [DATE] [DATE], [DATE].  [DATE] [DATE], [DATE].

CLINICAL DATA: Acute epigastric abdominal pain.

EXAM:
CT ABDOMEN AND PELVIS WITHOUT CONTRAST
TECHNIQUE: Multidetector CT imaging of the abdomen and pelvis was performed
following the standard protocol without IV contrast.

[Series 2: axial st · axial · 0.76mm/px · z∈[-423,-23]mm · 12 of 91 slices shown, 17 images]
[im 6/91  soft-tissue]
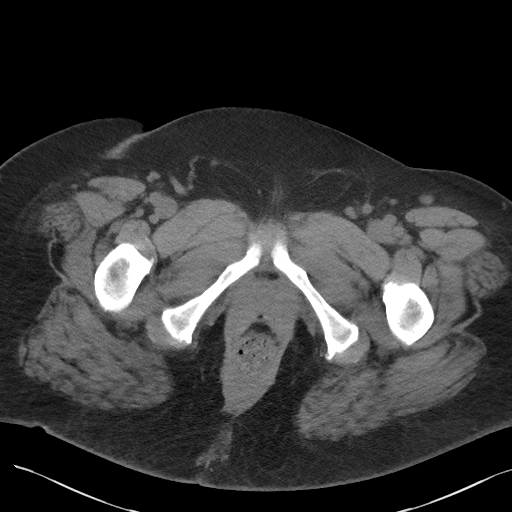
[im 6/91  bone]
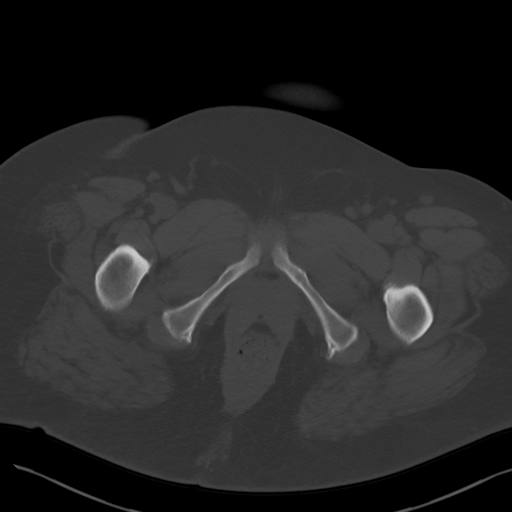
[im 16/91  soft-tissue]
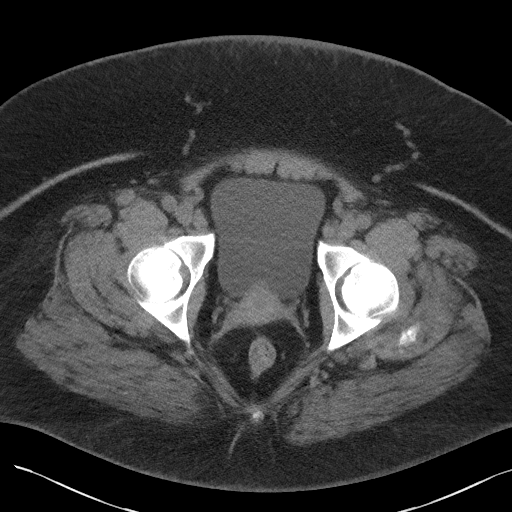
[im 21/91  soft-tissue]
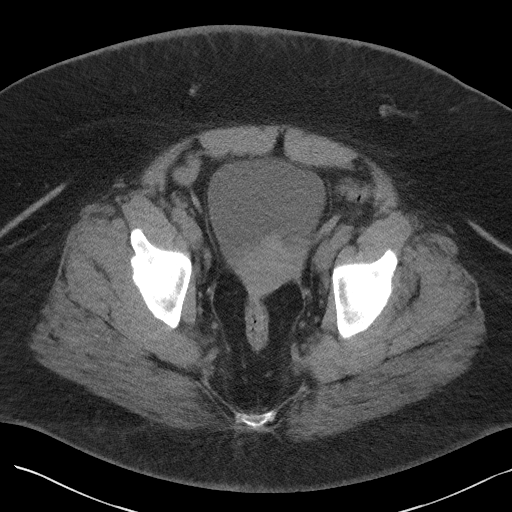
[im 31/91  soft-tissue]
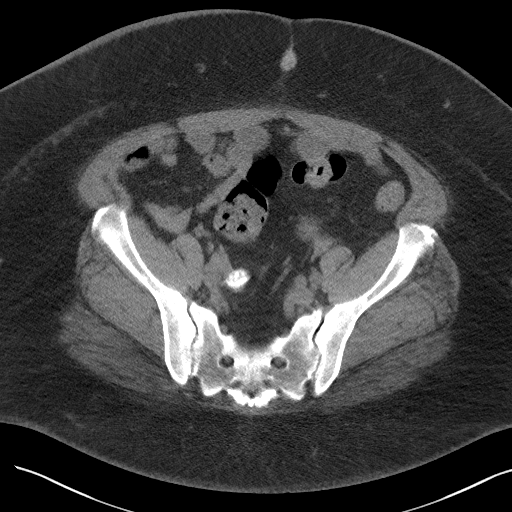
[im 36/91  soft-tissue]
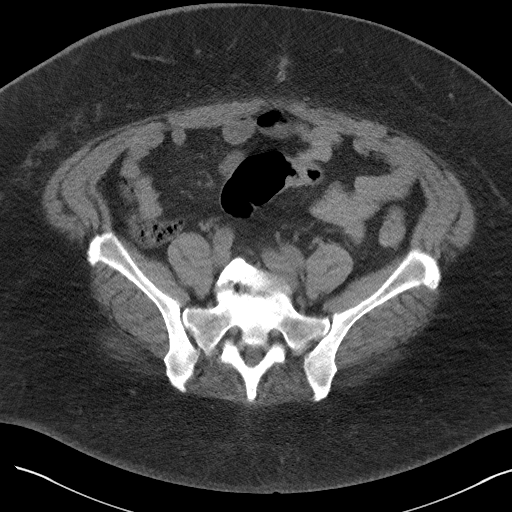
[im 46/91  soft-tissue]
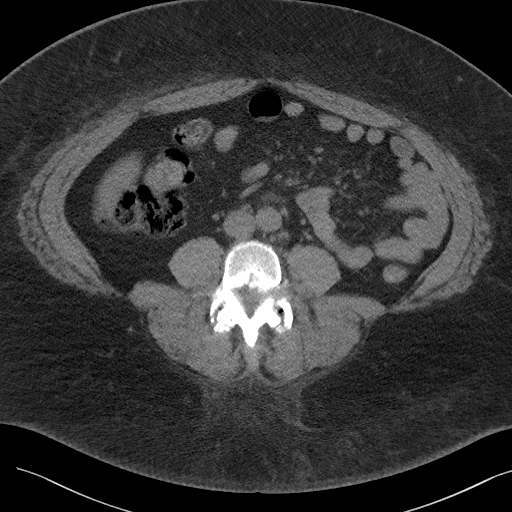
[im 56/91  soft-tissue]
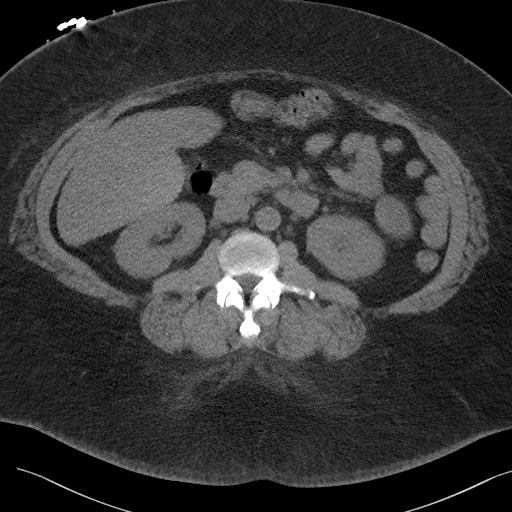
[im 61/91  soft-tissue]
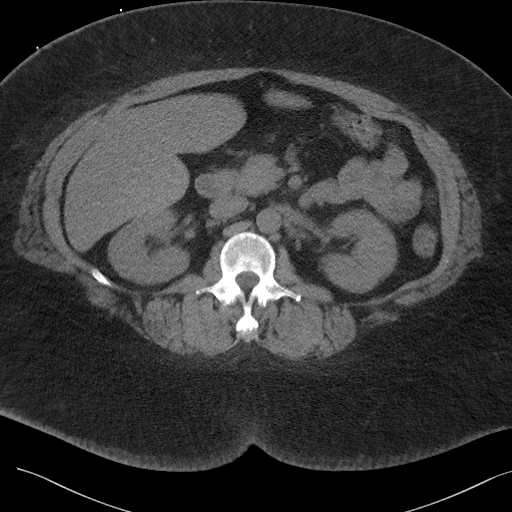
[im 71/91  soft-tissue]
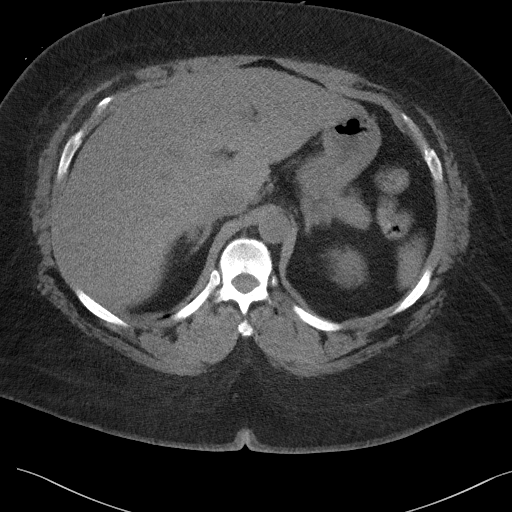
[im 71/91  lung]
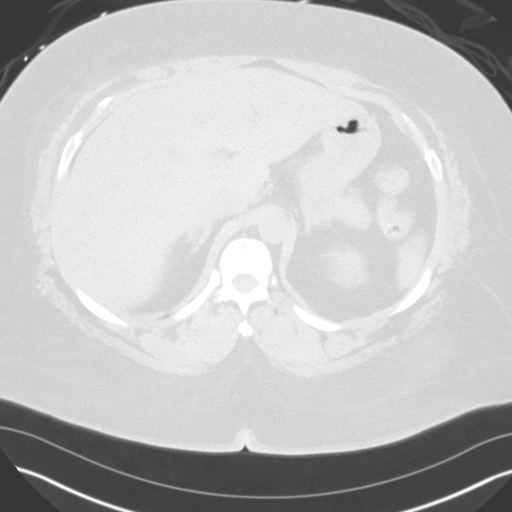
[im 71/91  bone]
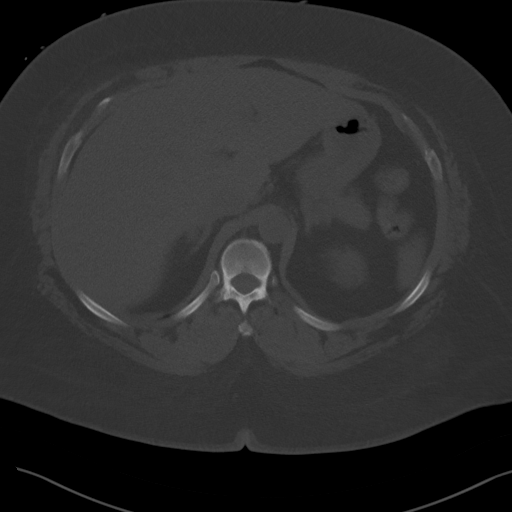
[im 76/91  soft-tissue]
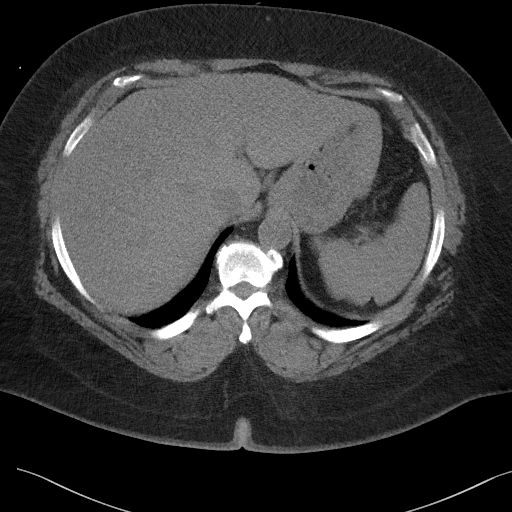
[im 76/91  lung]
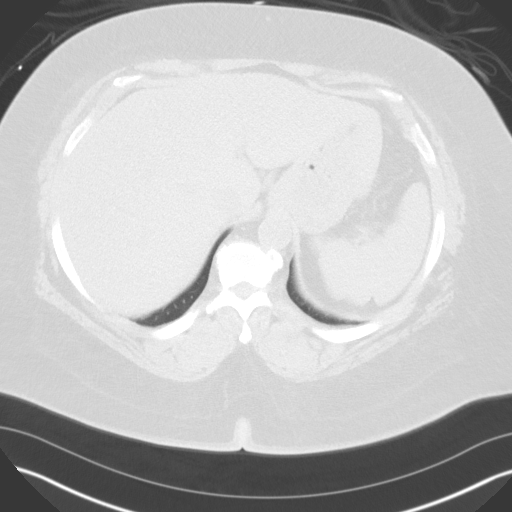
[im 81/91  lung]
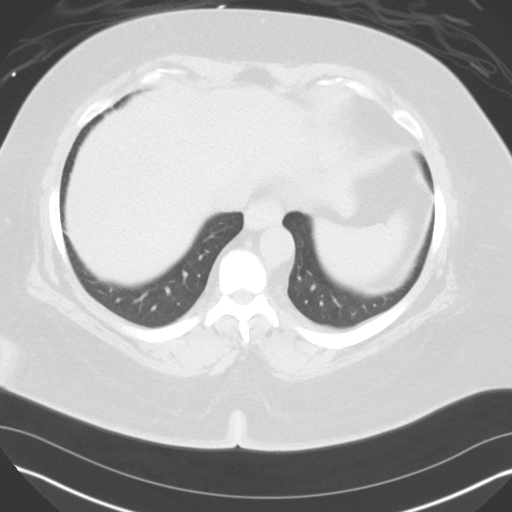
[im 86/91  soft-tissue]
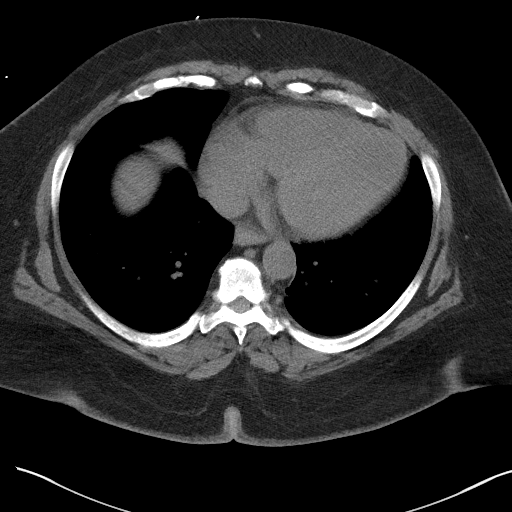
[im 86/91  lung]
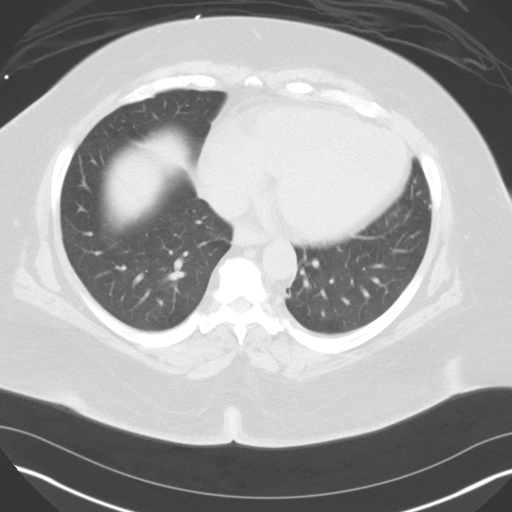

[Series 6: sagittal st · sagittal · 0.75mm/px · 1 of 152 slices shown, 2 images]
[im 51/152  soft-tissue]
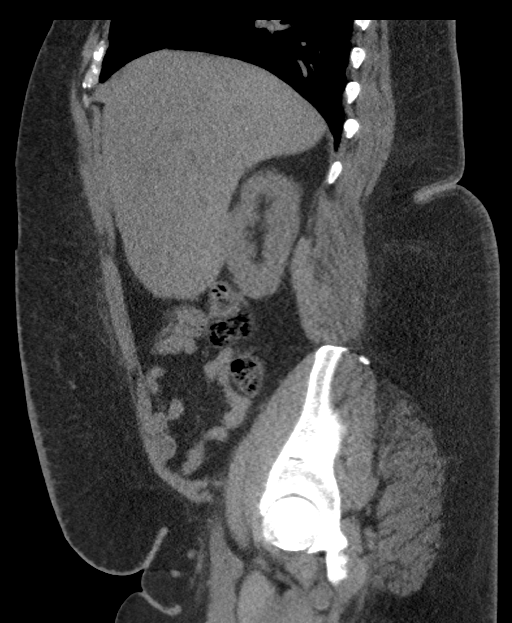
[im 51/152  bone]
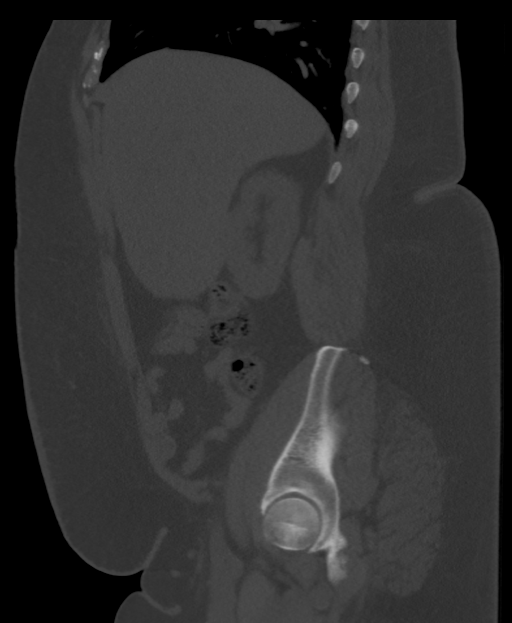

[13 of 36 positions shown; findings below may reference images not displayed]

FINDINGS: Lower chest: No acute abnormality.

Hepatobiliary: No focal liver abnormality is seen. Status post
cholecystectomy. No biliary dilatation.

Pancreas: Unremarkable. No pancreatic ductal dilatation or
surrounding inflammatory changes.

Spleen: Normal in size without focal abnormality.

Adrenals/Urinary Tract: Adrenal glands are unremarkable. Kidneys are
normal, without renal calculi, focal lesion, or hydronephrosis.
Bladder is unremarkable.

Stomach/Bowel: Stomach is within normal limits. Appendix appears
normal. No evidence of bowel wall thickening, distention, or
inflammatory changes.

Vascular/Lymphatic: No significant vascular findings are present. No
enlarged abdominal or pelvic lymph nodes.

Reproductive: No adnexal abnormality is noted. Stable uterine
fibroid is noted.

Other: No abdominal wall hernia or abnormality. No abdominopelvic
ascites.

Musculoskeletal: No acute or significant osseous findings.
IMPRESSION: 1. Stable uterine fibroid.
2. No other abnormality seen in the abdomen or pelvis.

## 2021-10-08 ENCOUNTER — Other Ambulatory Visit: Payer: Self-pay | Admitting: Family Medicine

## 2021-10-22 ENCOUNTER — Telehealth: Payer: Self-pay

## 2021-10-22 ENCOUNTER — Other Ambulatory Visit: Payer: Self-pay | Admitting: Physical Medicine and Rehabilitation

## 2021-10-22 NOTE — Telephone Encounter (Signed)
Deborah [name] left a message to see if you will send a script for Tizanidine? Call back phone [phone].  I tried to her back several times of more detail. No answer. Message left for call back. Please advise or prescribe. Thank you.

## 2021-10-23 ENCOUNTER — Telehealth: Payer: Self-pay | Admitting: Registered Nurse

## 2021-10-23 ENCOUNTER — Other Ambulatory Visit: Payer: Self-pay | Admitting: Physical Medicine and Rehabilitation

## 2021-10-23 MED ORDER — CYCLOBENZAPRINE HCL 5 MG PO TABS: 5.0000 mg | ORAL_TABLET | Freq: Three times a day (TID) | ORAL | 0 refills | Status: DC | PRN

## 2021-10-23 NOTE — Telephone Encounter (Signed)
Dr Ranell Patrick sent Flexeril for Deborah [name]. This morning.  Call placed to [name], no answer. Left message to return the call.

## 2021-10-23 NOTE — Telephone Encounter (Signed)
I tried to call patient a few times today. No answer and was not able to leave a message.

## 2021-10-27 ENCOUNTER — Telehealth: Payer: Self-pay

## 2021-10-27 NOTE — Telephone Encounter (Signed)
Dr. Ranell Patrick is present in the office: A faxed drug interaction warning was received about the Rx Cyclobenzaprine 5 MG and CHF.

## 2021-10-27 NOTE — Telephone Encounter (Signed)
Generic message left on [facility]  voice mail for an urgent call back. No voice ID's

## 2021-11-02 IMAGING — CT NM PET SKULL BASE TO THIGH
1 of 7 series · 1 of 25 positions shown · non-contrast
Comparison: CT AP 08/23/2019

CLINICAL DATA: Initial treatment strategy for duodenal carcinoid.

EXAM:
NUCLEAR MEDICINE PET SKULL BASE TO THIGH
TECHNIQUE: 5.7 mCi Ga 68 DOTATATE was injected intravenously. Full-ring PET
imaging was performed from the skull base to thigh after the
radiotracer. CT data was obtained and used for attenuation
correction and anatomic localization.

[Series 4: ct sk_thigh 5.0 b31f · axial · 5.0mm · 0.98mm/px · 1 of 225 slices shown]
[im 225/225  brain]
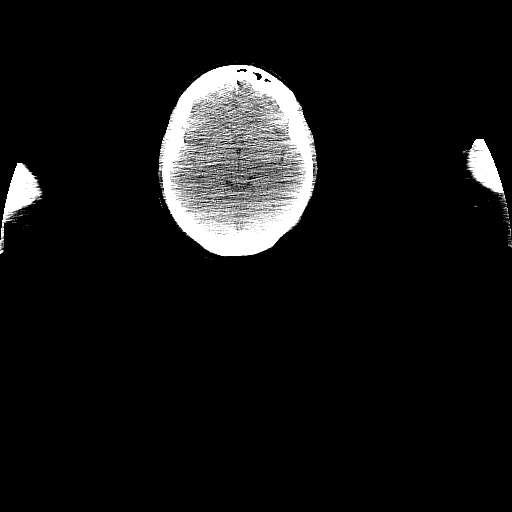

[1 of 25 positions shown; findings below may reference images not displayed]

FINDINGS: NECK

No radiotracer activity in neck lymph nodes.

Incidental CT findings: None

CHEST

No radiotracer accumulation within mediastinal or hilar lymph nodes.
No suspicious pulmonary nodules on the CT scan.

Incidental CT finding:Mild aortic atherosclerosis. No pericardial
effusion.

ABDOMEN/PELVIS

No abnormal radiotracer activity identified within the liver,
pancreas, spleen, or adrenal glands. No tracer avid lymph nodes
within the abdomen or pelvis. No abnormal tracer uptake identified
within the bowel loops.

Physiologic activity noted in the liver, spleen, adrenal glands and
kidneys.

Incidental CT findings:Exophytic uterine fibroid arising from the
left side of fundus is again noted and measures 5.1 cm, image 157/4.

SKELETON

No focal activity to suggest skeletal metastasis.

Incidental CT findings:None
IMPRESSION: 1. No signs of tracer avid solid organ or nodal metastases.

## 2021-11-10 ENCOUNTER — Telehealth: Payer: Self-pay

## 2021-11-10 MED ORDER — HYDROMORPHONE HCL 4 MG PO TABS
4.0000 mg | ORAL_TABLET | Freq: Two times a day (BID) | ORAL | 0 refills | Status: DC
Start: 2021-11-10 — End: 2021-12-02

## 2021-11-10 NOTE — Telephone Encounter (Signed)
[name] was Reviewed, last fill date 10/14/2021 Call placed to [name] , mail box is full.

## 2021-11-10 NOTE — Telephone Encounter (Signed)
Patient is calling for a refill on Hydromorphone 4 mg. Per [name], last filled 10/14/21

## 2021-11-11 ENCOUNTER — Telehealth: Payer: Self-pay | Admitting: Registered Nurse

## 2021-11-11 MED ORDER — CYCLOBENZAPRINE HCL 5 MG PO TABS: 5.0000 mg | ORAL_TABLET | Freq: Three times a day (TID) | ORAL | 0 refills | Status: DC | PRN

## 2021-11-11 NOTE — Telephone Encounter (Signed)
Placed a call to Ms. Hallinan, She is aware her medication was sent to pharmacy. She requested a refill of Flexeril. Her scheduled appointment changed. She verbalizes understanding.

## 2021-11-16 ENCOUNTER — Ambulatory Visit: Payer: Medicare Other | Admitting: Registered Nurse

## 2021-11-24 ENCOUNTER — Other Ambulatory Visit: Payer: Self-pay | Admitting: Physical Medicine and Rehabilitation

## 2021-11-25 ENCOUNTER — Other Ambulatory Visit: Payer: Self-pay | Admitting: Physical Medicine and Rehabilitation

## 2021-12-02 ENCOUNTER — Encounter: Payer: Medicare Other | Attending: Registered Nurse | Admitting: Registered Nurse

## 2021-12-02 VITALS — BP 147/82 | HR 105

## 2021-12-02 DIAGNOSIS — G893 Neoplasm related pain (acute) (chronic): Secondary | ICD-10-CM | POA: Diagnosis present

## 2021-12-02 DIAGNOSIS — G8929 Other chronic pain: Secondary | ICD-10-CM | POA: Diagnosis present

## 2021-12-02 DIAGNOSIS — G894 Chronic pain syndrome: Secondary | ICD-10-CM

## 2021-12-02 DIAGNOSIS — M545 Low back pain, unspecified: Secondary | ICD-10-CM | POA: Diagnosis not present

## 2021-12-02 DIAGNOSIS — Z79891 Long term (current) use of opiate analgesic: Secondary | ICD-10-CM | POA: Diagnosis not present

## 2021-12-02 DIAGNOSIS — Z5181 Encounter for therapeutic drug level monitoring: Secondary | ICD-10-CM | POA: Diagnosis not present

## 2021-12-02 DIAGNOSIS — C7A010 Malignant carcinoid tumor of the duodenum: Secondary | ICD-10-CM

## 2021-12-02 DIAGNOSIS — M62838 Other muscle spasm: Secondary | ICD-10-CM

## 2021-12-02 MED ORDER — HYDROMORPHONE HCL 4 MG PO TABS: 4.0000 mg | ORAL_TABLET | Freq: Two times a day (BID) | ORAL | 0 refills | Status: DC

## 2021-12-02 MED ORDER — HYDROMORPHONE HCL 4 MG PO TABS
4.0000 mg | ORAL_TABLET | Freq: Two times a day (BID) | ORAL | 0 refills | Status: DC
Start: 2021-12-02 — End: 2022-02-03

## 2021-12-02 NOTE — Progress Notes (Signed)
Subjective:    Patient ID: Deborah [name], female    DOB: 04/07/1965, 56 y.o.   MRN: [id number]  HPI: Deborah [name] is a 56 y.o. female who returns for follow up appointment for chronic pain and medication refill. She states her pain is located in her abdomen and lower back. She also reports increase frequency of muscle spasms with no relief with her current dose of Flexeril. This was discussed with Dr Ranell Patrick, she agrees with increasing her Fleril to 10 mg three times a day as needed. Call placed to Deborah [name] regarding the above and she verbalizes understanding.  She rates her pain 10.Deborah KitchenHer  current exercise regime is walking short distances with her walker and performing stretching exercises.  [name] Morphine equivalent is 53.33 MME.   UDS orderd today.    Apical Pulse was check: Heart Rate 100. Pain Inventory Average Pain 10 Pain Right Now 10 My pain is sharp, stabbing, and aching  In the last 24 hours, has pain interfered with the following? General activity 10 Relation with others 10 Enjoyment of life 10 What TIME of day is your pain at its worst? morning , daytime, evening, and night Sleep (in general) Poor  Pain is worse with: walking, bending, sitting, inactivity, standing, and some activites Pain improves with: medication Relief from Meds: 5  Family History  Problem Relation Age of Onset   Diabetes Mother    Diabetes Father    Heart disease Father    Diabetes Sister    Congestive Heart Failure Sister 73   Diabetes Brother    Social History   Socioeconomic History   Marital status: Married    Spouse name: Not on file   Number of children: Not on file   Years of education: Not on file   Highest education level: Not on file  Occupational History   Not on file  Tobacco Use   Smoking status: Never   Smokeless tobacco: Never  Vaping Use   Vaping Use: Never used  Substance and Sexual Activity   Alcohol use: No   Drug use: No   Sexual  activity: Not Currently    Birth control/protection: None  Other Topics Concern   Not on file  Social History Narrative   ** Merged History Encounter **       Social Determinants of Health   Financial Resource Strain: Not on file  Food Insecurity: Not on file  Transportation Needs: Not on file  Physical Activity: Not on file  Stress: Not on file  Social Connections: Not on file   Past Surgical History:  Procedure Laterality Date   BIOPSY  07/27/2019   Procedure: BIOPSY;  Surgeon: Deborah Essex, MD;  [facility];  Service: Endoscopy;;   BIOPSY  07/30/2019   Procedure: BIOPSY;  Surgeon: Deborah Brace, MD;  [facility];  Service: Gastroenterology;;   CATARACT EXTRACTION  01/2014   CHOLECYSTECTOMY     COLONOSCOPY WITH PROPOFOL N/A 07/30/2019   Procedure: COLONOSCOPY WITH PROPOFOL;  Surgeon: Deborah Brace, MD;  [facility];  Service: Gastroenterology;  Laterality: N/A;   ESOPHAGOGASTRODUODENOSCOPY N/A 07/27/2019   Procedure: ESOPHAGOGASTRODUODENOSCOPY (EGD);  Surgeon: Deborah Essex, MD;  [facility];  Service: Endoscopy;  Laterality: N/A;   ESOPHAGOGASTRODUODENOSCOPY N/A 07/26/2020   Procedure: ESOPHAGOGASTRODUODENOSCOPY (EGD);  Surgeon: Deborah Silence, MD;  [facility];  Service: Endoscopy;  Laterality: N/A;   ESOPHAGOGASTRODUODENOSCOPY (EGD) WITH PROPOFOL N/A 08/02/2019   Procedure: ESOPHAGOGASTRODUODENOSCOPY (EGD) WITH PROPOFOL;  Surgeon: Deborah [name],  Deborah Gear, MD;  [facility];  Service: Gastroenterology;  Laterality: N/A;   HEMOSTASIS CLIP PLACEMENT  08/02/2019   Procedure: HEMOSTASIS CLIP PLACEMENT;  Surgeon: Deborah Brace, MD;  [facility];  Service: Gastroenterology;;   POLYPECTOMY  07/30/2019   Procedure: POLYPECTOMY;  Surgeon: Deborah Brace, MD;  [facility];  Service: Gastroenterology;;   POLYPECTOMY  08/02/2019   Procedure: POLYPECTOMY;  Surgeon: Deborah Brace, MD;  [facility];  Service: Gastroenterology;;   Past Surgical History:  Procedure Laterality Date   BIOPSY  07/27/2019   Procedure: BIOPSY;  Surgeon: Deborah Essex, MD;  [facility];  Service: Endoscopy;;   BIOPSY  07/30/2019   Procedure: BIOPSY;  Surgeon: Deborah Brace, MD;  [facility];  Service: Gastroenterology;;   CATARACT EXTRACTION  01/2014   CHOLECYSTECTOMY     COLONOSCOPY WITH PROPOFOL N/A 07/30/2019   Procedure: COLONOSCOPY WITH PROPOFOL;  Surgeon: Deborah Brace, MD;  [facility];  Service: Gastroenterology;  Laterality: N/A;   ESOPHAGOGASTRODUODENOSCOPY N/A 07/27/2019   Procedure: ESOPHAGOGASTRODUODENOSCOPY (EGD);  Surgeon: Deborah Essex, MD;  [facility];  Service: Endoscopy;  Laterality: N/A;   ESOPHAGOGASTRODUODENOSCOPY N/A 07/26/2020   Procedure: ESOPHAGOGASTRODUODENOSCOPY (EGD);  Surgeon: Deborah Silence, MD;  [facility];  Service: Endoscopy;  Laterality: N/A;   ESOPHAGOGASTRODUODENOSCOPY (EGD) WITH PROPOFOL N/A 08/02/2019   Procedure: ESOPHAGOGASTRODUODENOSCOPY (EGD) WITH PROPOFOL;  Surgeon: Deborah Brace, MD;  [facility];  Service: Gastroenterology;  Laterality: N/A;   HEMOSTASIS CLIP PLACEMENT  08/02/2019   Procedure: HEMOSTASIS CLIP PLACEMENT;  Surgeon: Deborah Brace, MD;  [facility];  Service: Gastroenterology;;   POLYPECTOMY  07/30/2019   Procedure: POLYPECTOMY;  Surgeon: Deborah Brace, MD;  [facility];  Service: Gastroenterology;;   POLYPECTOMY  08/02/2019   Procedure: POLYPECTOMY;  Surgeon: Deborah Brace, MD;  [facility];  Service: Gastroenterology;;   Past Medical History:  Diagnosis Date   Acute back pain with sciatica, left    Acute back pain with sciatica, right    AKI (acute kidney injury) ([place])    Anemia, unspecified    Cancer ([place])    Carcinoid tumor of duodenum    Chest pain with normal coronary angiography 2019   Chronic kidney disease, stage  3b ([place])    Chronic pain    Chronic systolic CHF (congestive heart failure) ([place])    Diabetes mellitus    DKA (diabetic ketoacidosis) ([place])    Drug-seeking behavior    21 hospitalizations and 14 CT a/p in 2 years for N/V and abdominal pain, demanding only IV dilaudid   Elevated troponin    chronic   Esophageal reflux    Fibromyalgia    Gastric ulcer    Gastroparesis    Gout    Hyperlipidemia    Hypertension    Hypokalemia    Hypomagnesemia    Lumbosacral stenosis    LVH (left ventricular hypertrophy)    Morbid obesity ([facility])    NICM (nonischemic cardiomyopathy) ([facility])    PAF (paroxysmal atrial fibrillation) ([place])    Stroke ([place]) 02/2011   Thrombocytosis    Vitamin B12 deficiency anemia    BP (!) 147/82   Pulse (!) 105   LMP 10/10/2012   SpO2 99%   Opioid Risk Score:   Fall Risk Score:  `1  Depression screen [facility] 2/9     09/16/2021    9:16 AM 06/01/2021    1:55 PM 08/01/2020    2:56 PM 06/05/2020    3:01 PM 01/04/2020  2:20 PM 11/15/2019    3:35 PM 11/14/2019    2:04 PM  Depression screen PHQ 2/9  Decreased Interest '3 1 1 1 2 3 1  '$ Down, Depressed, Hopeless '3 1 1 1 3 3 1  '$ PHQ - 2 Score '6 2 2 2 5 6 2  '$ Altered sleeping     3 3   Tired, decreased energy     3 3   Change in appetite     3 3   Feeling bad or failure about yourself      3 3   Trouble concentrating     3 3   Moving slowly or fidgety/restless     3 3   Suicidal thoughts     0 3   PHQ-9 Score     23 27      Review of Systems  Musculoskeletal:  Positive for back pain.  All other systems reviewed and are negative.     Objective:   Physical Exam Vitals and nursing note reviewed.  Constitutional:      Appearance: Normal appearance.  Cardiovascular:     Rate and Rhythm: Normal rate and regular rhythm.     Pulses: Normal pulses.     Heart sounds: Normal heart sounds.  Pulmonary:     Effort: Pulmonary effort is normal.     Breath sounds: Normal breath sounds.  Musculoskeletal:     Cervical back:  Normal range of motion and neck supple.     Comments: Normal Muscle Bulk and Muscle Testing Reveals:  Upper Extremities: Full ROM and Muscle Strength 5/5  Lower Extremities: Full ROM and Muscle Strength 5/5 Arrived in wheelchair    Skin:    General: Skin is warm and dry.  Neurological:     Mental Status: She is alert and oriented to person, place, and time.  Psychiatric:        Mood and Affect: Mood normal.        Behavior: Behavior normal.         Assessment & Plan:  1. 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 12/04/2021 2. Chronic Pain Syndrome: Refilled Dilaudid 4 mg one tablet twice a day as needed for sever pain., may take a extra tablet 10 days out of the month when pain is sever.  #80.Second script sent for the following month.  We will continue the opioid monitoring program, this consists of regular clinic visits, examinations, urine drug screen, pill counts as well as use of [place] Controlled Substance Reporting system. A 12 month History has been reviewed on the [place] Controlled Substance Reporting System on 12/04/2021. 3. Chronic Bilateral Lower Back Pain without sciatica: Continue HEP as Tolerated. Continue to Monitor. 12/04/2021  4. Muscle Spasm: Flexeril 10 mg one tablet three times a day as needed #90. Continue to monitor. 12/04/2021 F/U in 2 months

## 2021-12-04 ENCOUNTER — Encounter: Payer: Self-pay | Admitting: Registered Nurse

## 2021-12-04 MED ORDER — CYCLOBENZAPRINE HCL 10 MG PO TABS
10.0000 mg | ORAL_TABLET | Freq: Three times a day (TID) | ORAL | 1 refills | Status: DC | PRN
Start: 2021-12-04 — End: 2022-03-17

## 2021-12-05 LAB — TOXASSURE SELECT,+ANTIDEPR,UR

## 2021-12-09 ENCOUNTER — Telehealth: Payer: Self-pay | Admitting: *Deleted

## 2021-12-09 NOTE — Telephone Encounter (Signed)
Urine drug screen for this encounter is consistent for prescribed medication 

## 2021-12-15 ENCOUNTER — Encounter: Payer: Medicare Other | Attending: Registered Nurse | Admitting: Physical Medicine and Rehabilitation

## 2021-12-15 DIAGNOSIS — Z79891 Long term (current) use of opiate analgesic: Secondary | ICD-10-CM | POA: Insufficient documentation

## 2021-12-15 DIAGNOSIS — M545 Low back pain, unspecified: Secondary | ICD-10-CM | POA: Insufficient documentation

## 2021-12-15 DIAGNOSIS — G894 Chronic pain syndrome: Secondary | ICD-10-CM | POA: Insufficient documentation

## 2021-12-15 DIAGNOSIS — G893 Neoplasm related pain (acute) (chronic): Secondary | ICD-10-CM | POA: Insufficient documentation

## 2021-12-15 DIAGNOSIS — C7A010 Malignant carcinoid tumor of the duodenum: Secondary | ICD-10-CM | POA: Insufficient documentation

## 2021-12-15 DIAGNOSIS — Z5181 Encounter for therapeutic drug level monitoring: Secondary | ICD-10-CM | POA: Insufficient documentation

## 2021-12-15 DIAGNOSIS — M62838 Other muscle spasm: Secondary | ICD-10-CM | POA: Insufficient documentation

## 2021-12-15 DIAGNOSIS — G8929 Other chronic pain: Secondary | ICD-10-CM | POA: Insufficient documentation

## 2022-01-04 IMAGING — CR DG CHEST 2V
2 series · 2 of 2 positions shown · non-contrast
Comparison: 08/19/2019

CLINICAL DATA: Chest pain and shortness of breath

EXAM:
CHEST - 2 VIEW

[w chest lat]
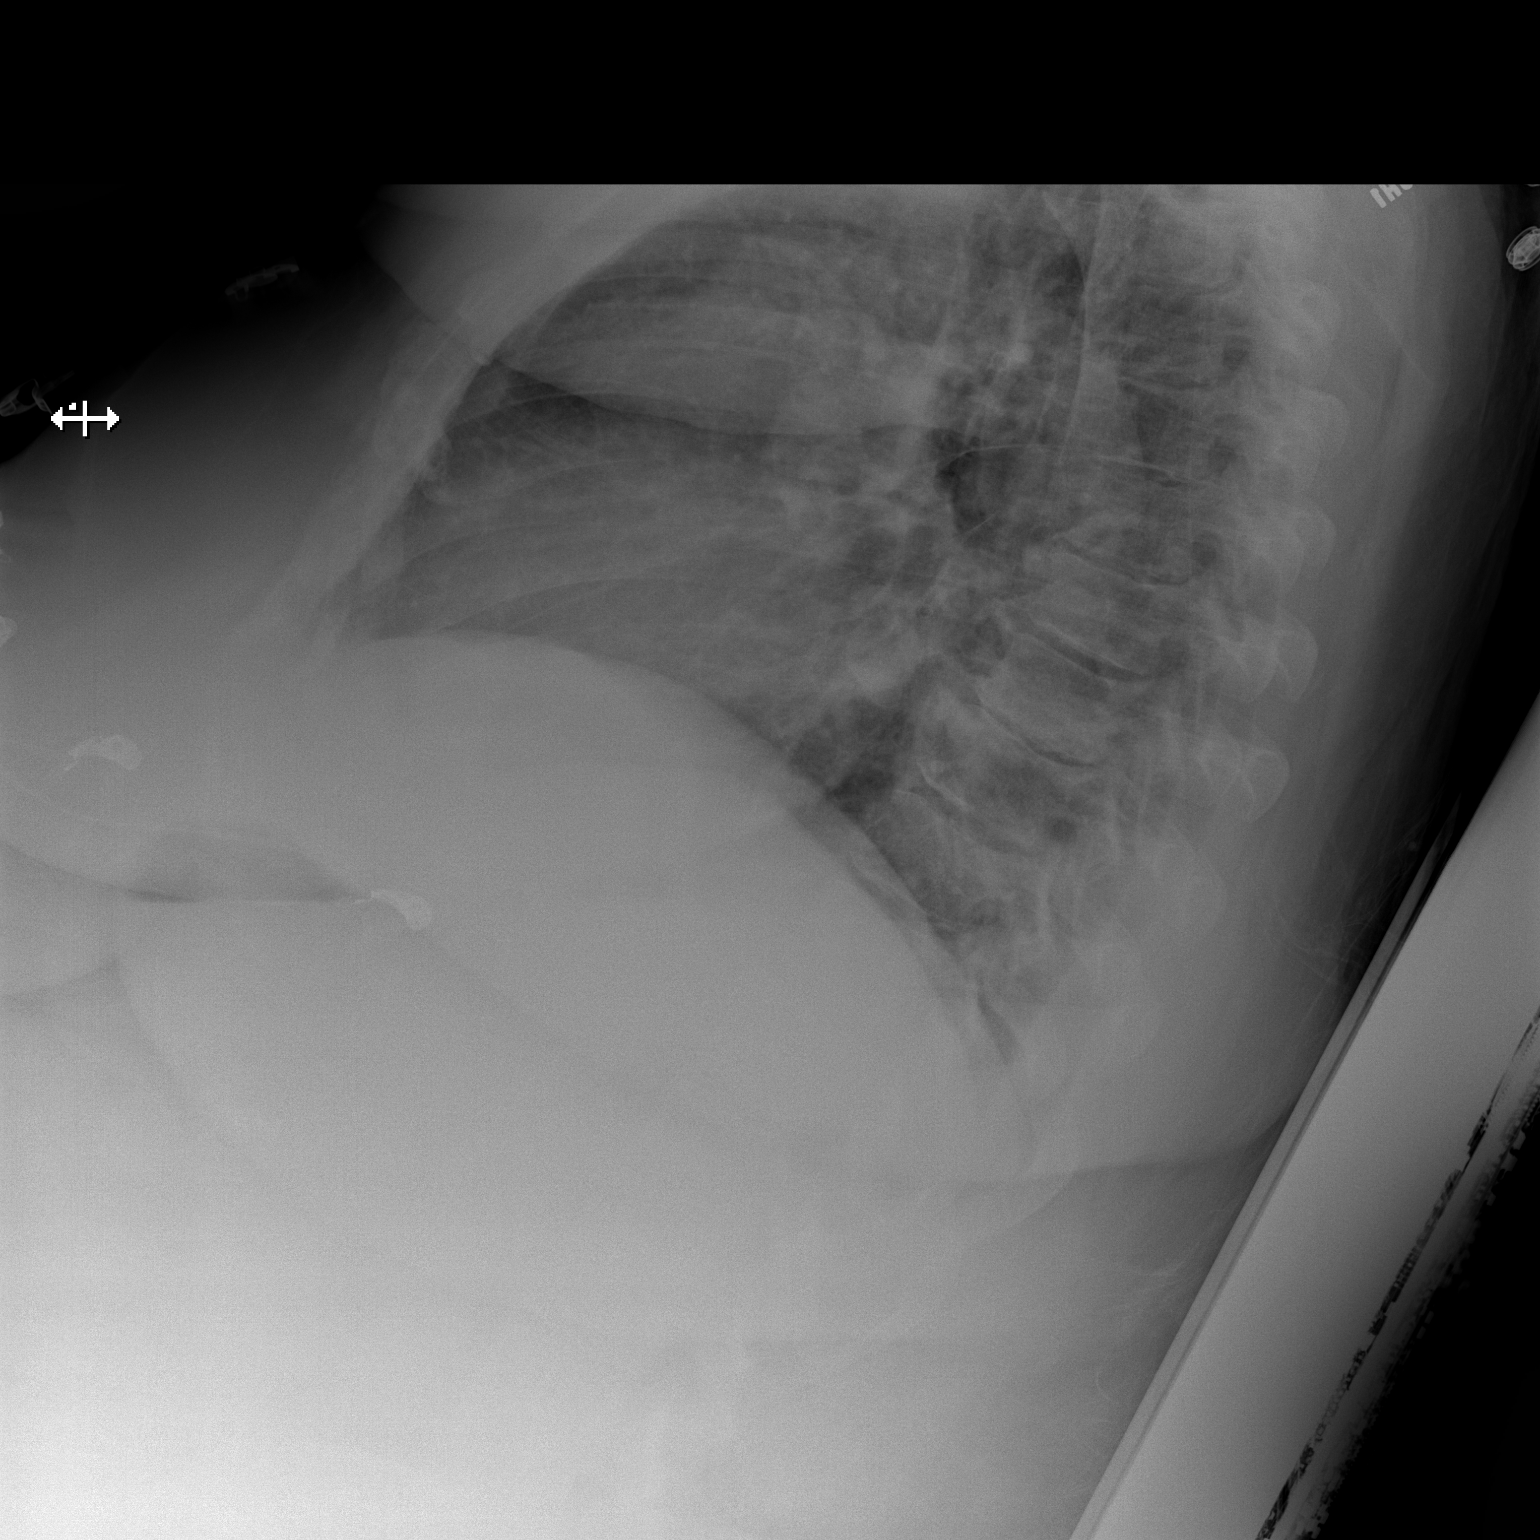

[x chest ap]
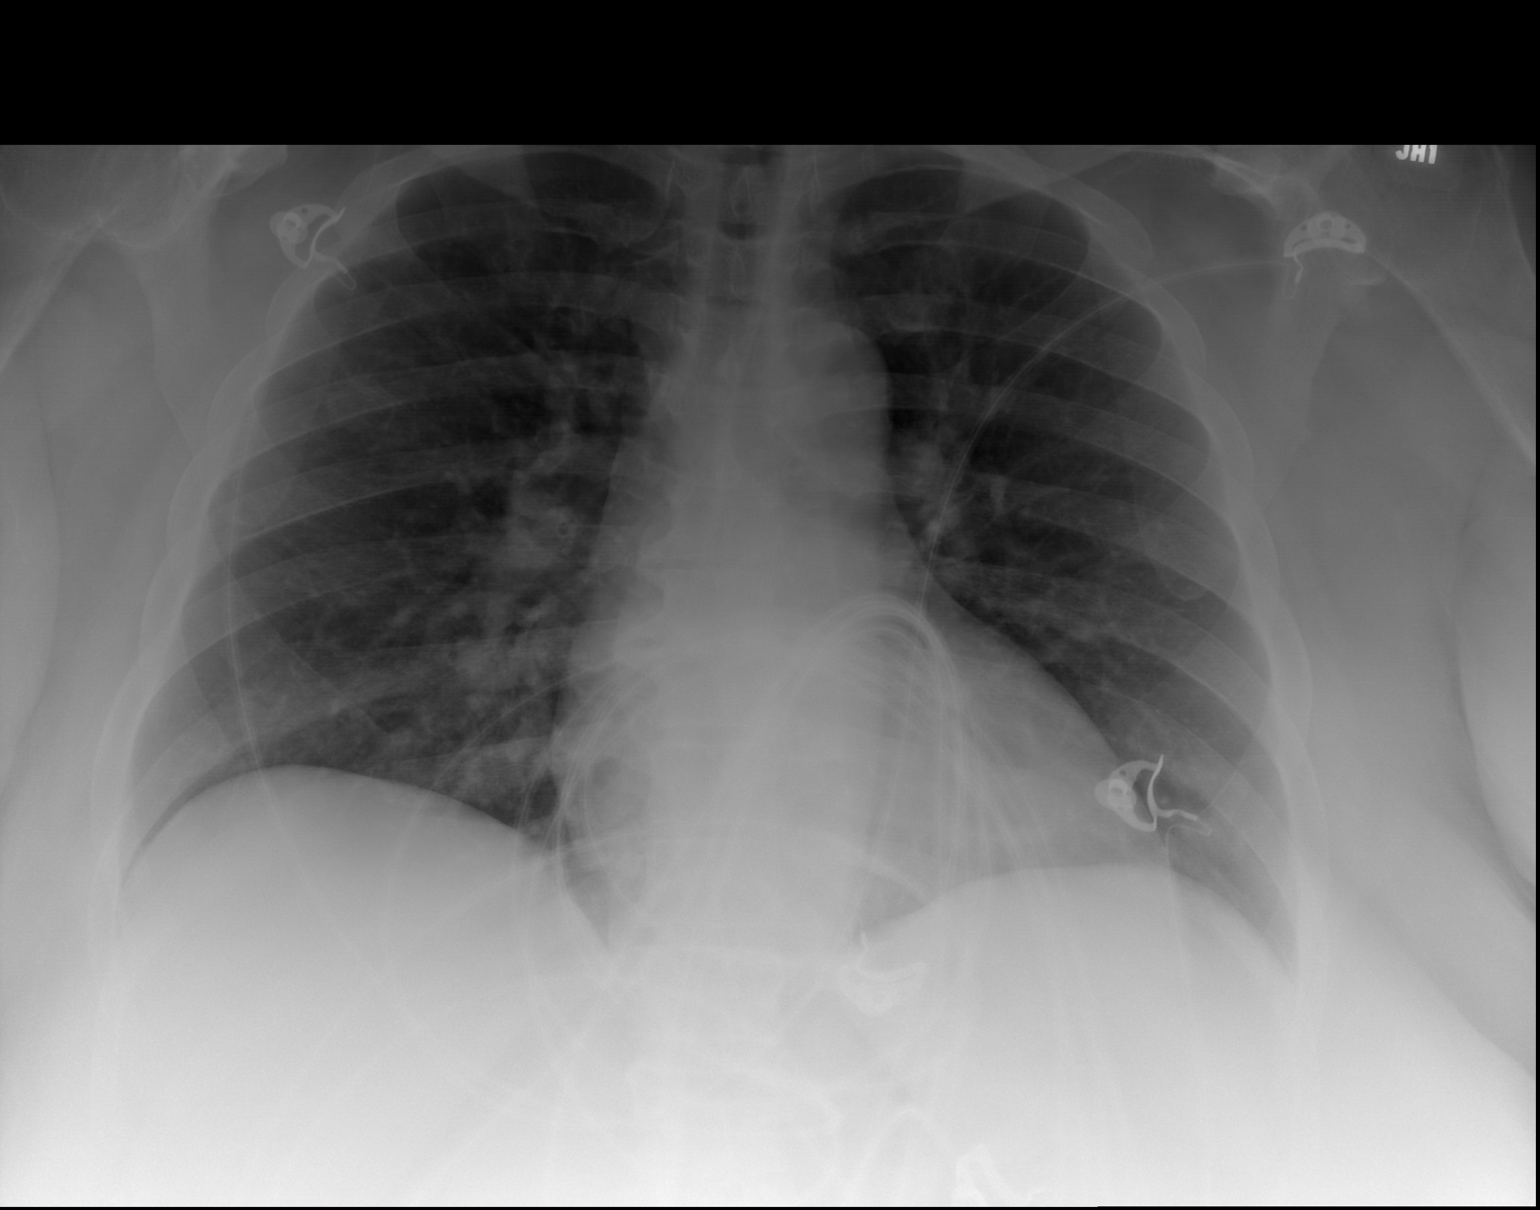

[2 of 2 positions shown; findings below may reference images not displayed]

FINDINGS: The heart size and mediastinal contours are within normal limits.
Both lungs are clear. The visualized skeletal structures are
unremarkable.
IMPRESSION: No active cardiopulmonary disease.

## 2022-01-04 IMAGING — CT CT ABD-PELV W/O CM
2 of 4 series · 17 of 46 positions shown, 19 images · non-contrast
Comparison: 08/23/2019

CLINICAL DATA: Abdominal pain, acute, nonlocalized, nausea and
vomiting. Duodenal carcinoid.

EXAM:
CT ABDOMEN AND PELVIS WITHOUT CONTRAST
TECHNIQUE: Multidetector CT imaging of the abdomen and pelvis was performed
following the standard protocol without IV contrast.

[Series 2: axial st · axial · 0.91mm/px · z∈[-445,-45]mm · 14 of 90 slices shown, 16 images]
[im 5/90  soft-tissue]
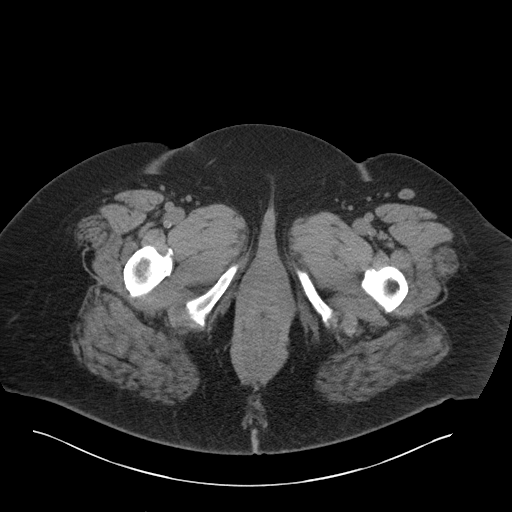
[im 5/90  bone]
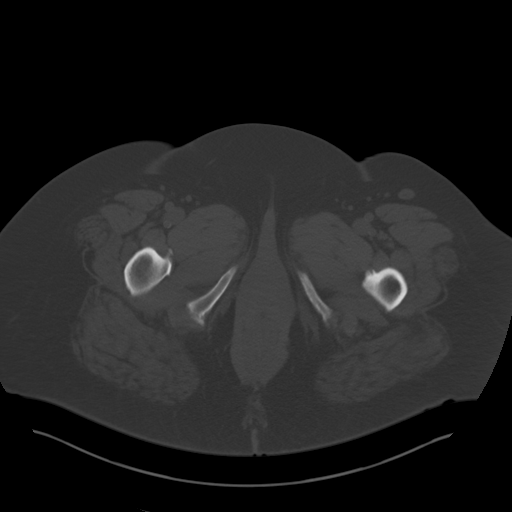
[im 10/90  soft-tissue]
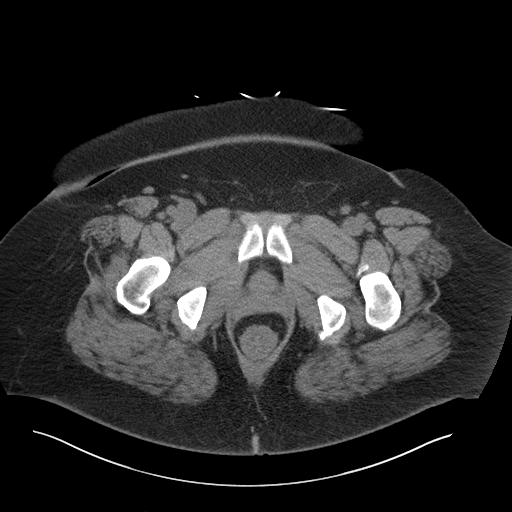
[im 19/90  soft-tissue]
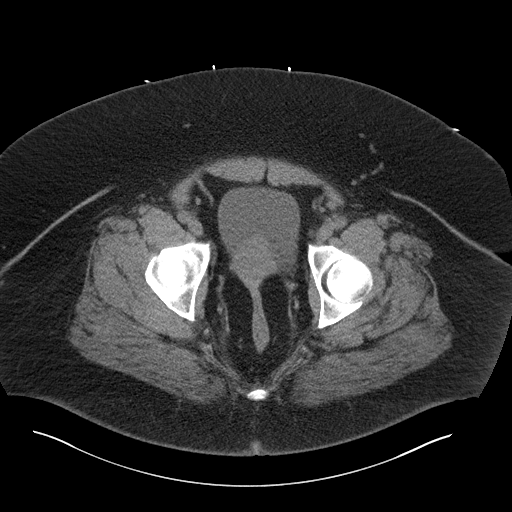
[im 24/90  soft-tissue]
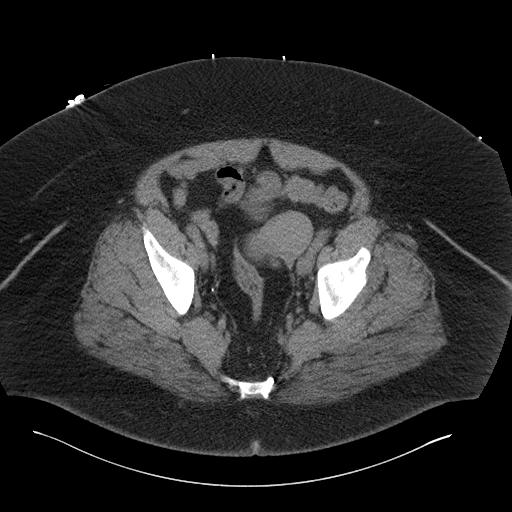
[im 29/90  soft-tissue]
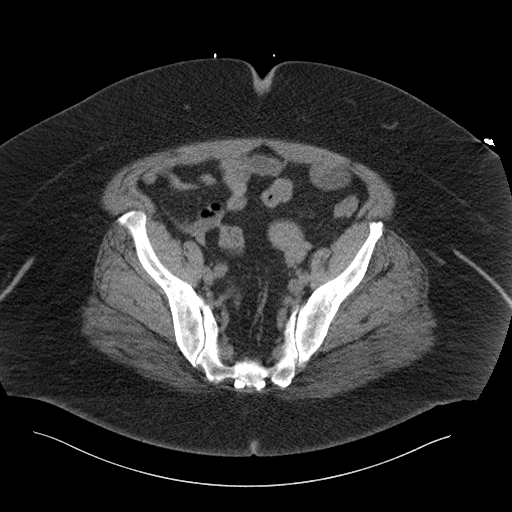
[im 38/90  soft-tissue]
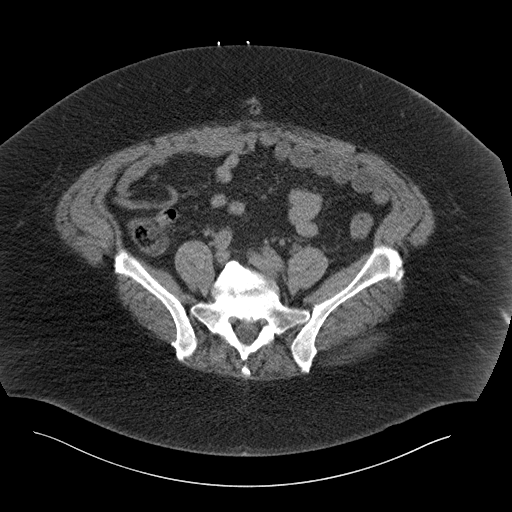
[im 43/90  soft-tissue]
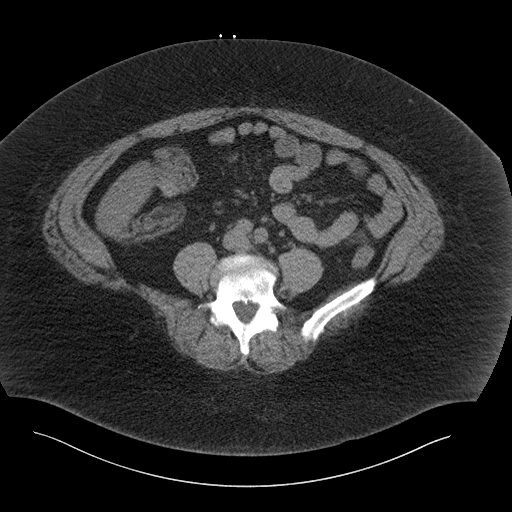
[im 47/90  soft-tissue]
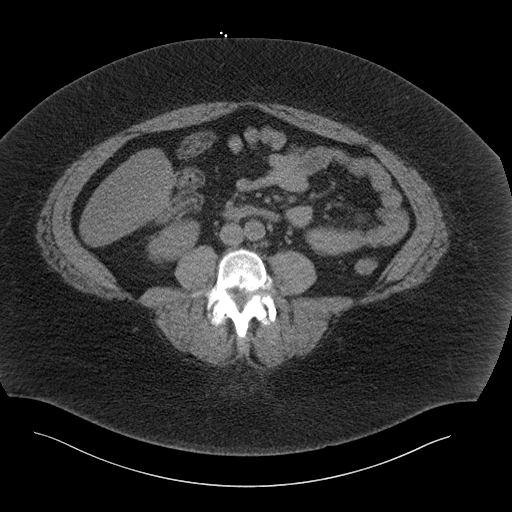
[im 52/90  soft-tissue]
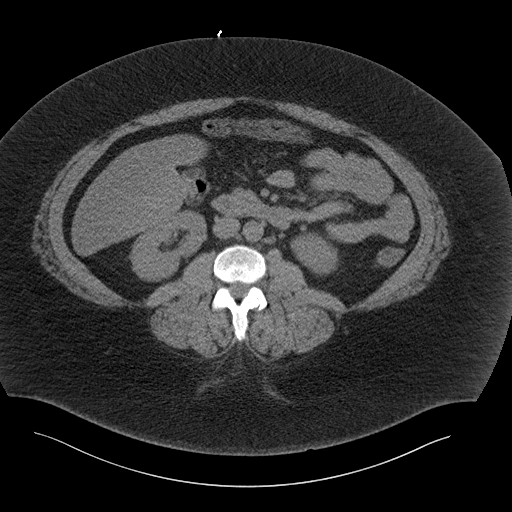
[im 52/90  bone]
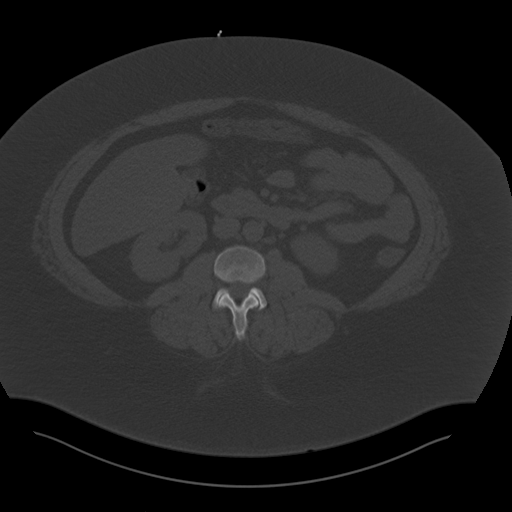
[im 61/90  soft-tissue]
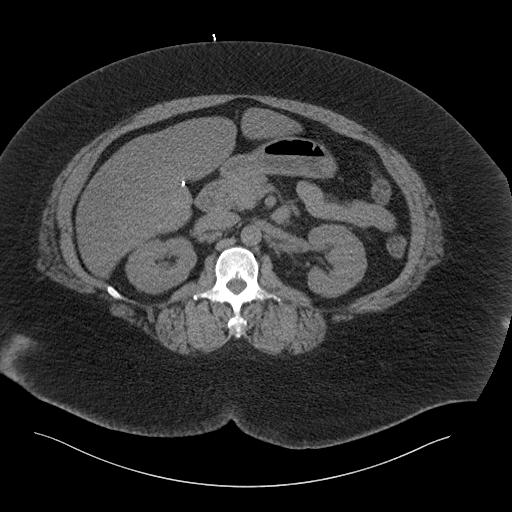
[im 66/90  soft-tissue]
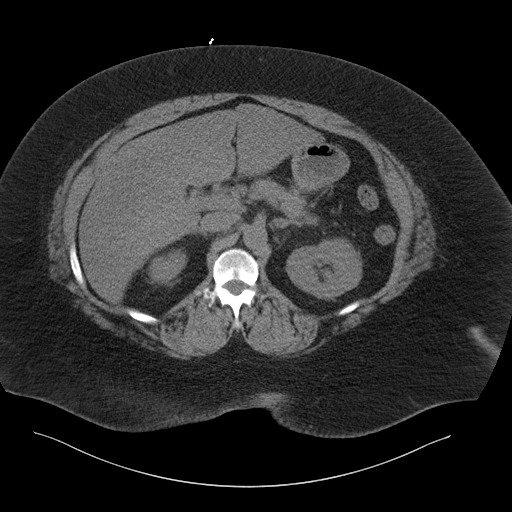
[im 71/90  soft-tissue]
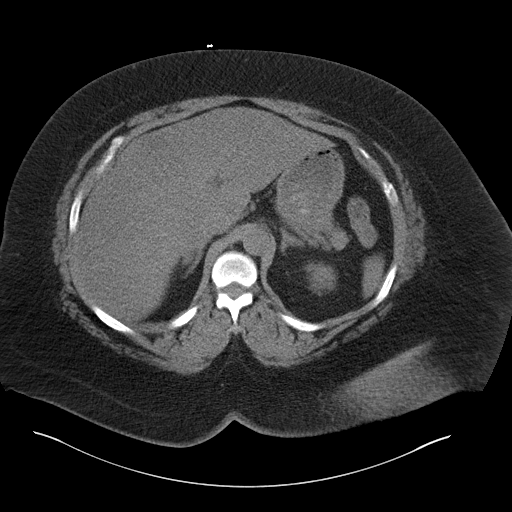
[im 80/90  soft-tissue]
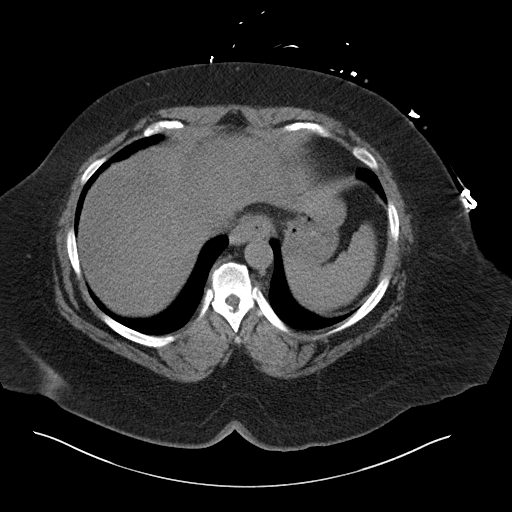
[im 85/90  soft-tissue]
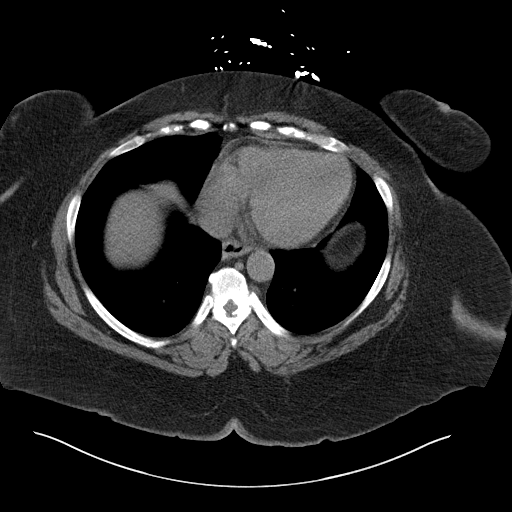

[Series 5: coronal st · coronal · 0.93mm/px · 3 of 180 slices shown]
[im 60/180  soft-tissue]
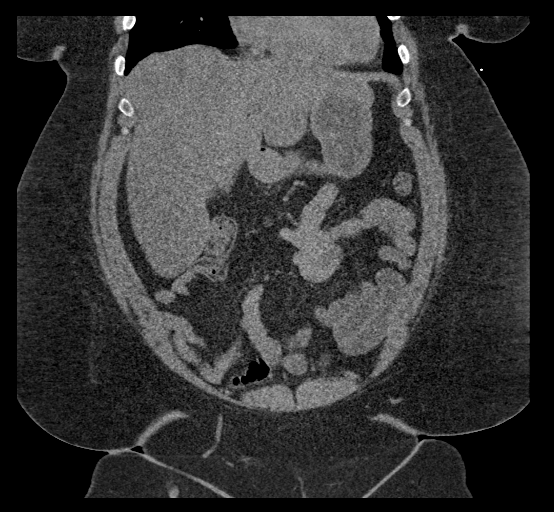
[im 80/180  soft-tissue]
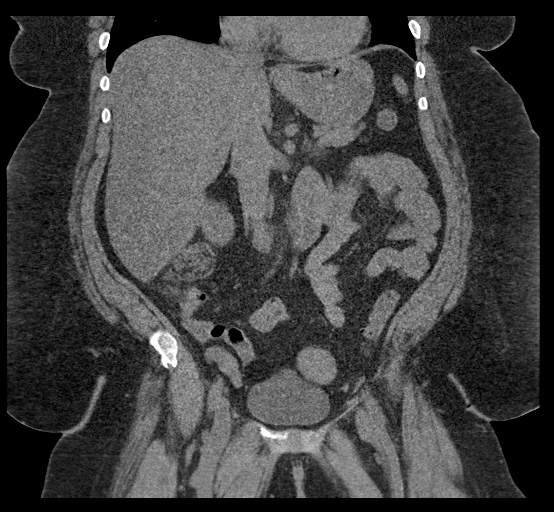
[im 100/180  soft-tissue]
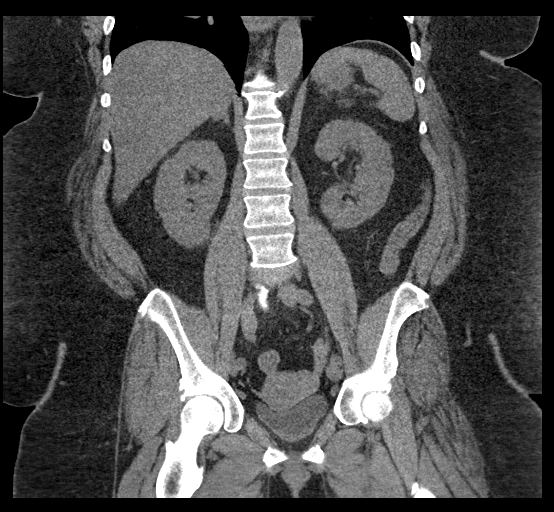

[17 of 46 positions shown; findings below may reference images not displayed]

FINDINGS: Lower chest: No acute abnormality.

Hepatobiliary: Cholecystectomy has been performed. At least mild
hepatic steatosis and mild hepatomegaly are stable since prior
examination. No focal intrahepatic mass on this noncontrast
examination. No intra or extrahepatic biliary ductal dilation.

Pancreas: Unremarkable

Spleen: Unremarkable

Adrenals/Urinary Tract: Adrenal glands are unremarkable. Kidneys are
normal, without renal calculi, focal lesion, or hydronephrosis.
Bladder is unremarkable.

Stomach/Bowel: The stomach, small bowel, and large bowel are
unremarkable. Appendix normal. No free intraperitoneal gas or fluid.

Vascular/Lymphatic: No significant vascular findings are present. No
enlarged abdominal or pelvic lymph nodes.

Reproductive: Exophytic uterine fibroid extending into the left
adnexa is again noted. The pelvic organs are otherwise unremarkable.

Other: Rectum unremarkable.

Musculoskeletal: Degenerative changes are seen within the lumbar
spine. No lytic or blastic bone lesions are seen.
IMPRESSION: No radiographic explanation for the patient's reported symptoms.

Stable hepatic steatosis and hepatomegaly.

Fibroid uterus.

## 2022-02-01 ENCOUNTER — Ambulatory Visit: Payer: Medicare Other | Admitting: Registered Nurse

## 2022-02-03 ENCOUNTER — Telehealth: Payer: Self-pay | Admitting: Registered Nurse

## 2022-02-03 ENCOUNTER — Encounter: Payer: Medicare Other | Admitting: Registered Nurse

## 2022-02-03 MED ORDER — HYDROMORPHONE HCL 4 MG PO TABS: 4.0000 mg | ORAL_TABLET | Freq: Two times a day (BID) | ORAL | 0 refills | Status: DC

## 2022-02-03 NOTE — Telephone Encounter (Signed)
Patient fell out of her wheelchair and hurt her leg. She is unable to come to appt today. She is scheduled for 02/23/22. Please send in her Dilaudid as she is out.

## 2022-02-03 NOTE — Telephone Encounter (Signed)
[name] was Reviewed.  Hydromorphone refilled today, her appointment was rescheduled. Deborah [name] will F/U with her PCP regarding the fall.

## 2022-02-04 ENCOUNTER — Other Ambulatory Visit: Payer: Self-pay | Admitting: Physical Medicine and Rehabilitation

## 2022-02-16 IMAGING — DX DG CHEST 1V PORT
1 series · 1 of 1 positions shown · non-contrast
Comparison: 11/26/2019.

CLINICAL DATA: Chest pain.

EXAM:
PORTABLE CHEST 1 VIEW

[chest]
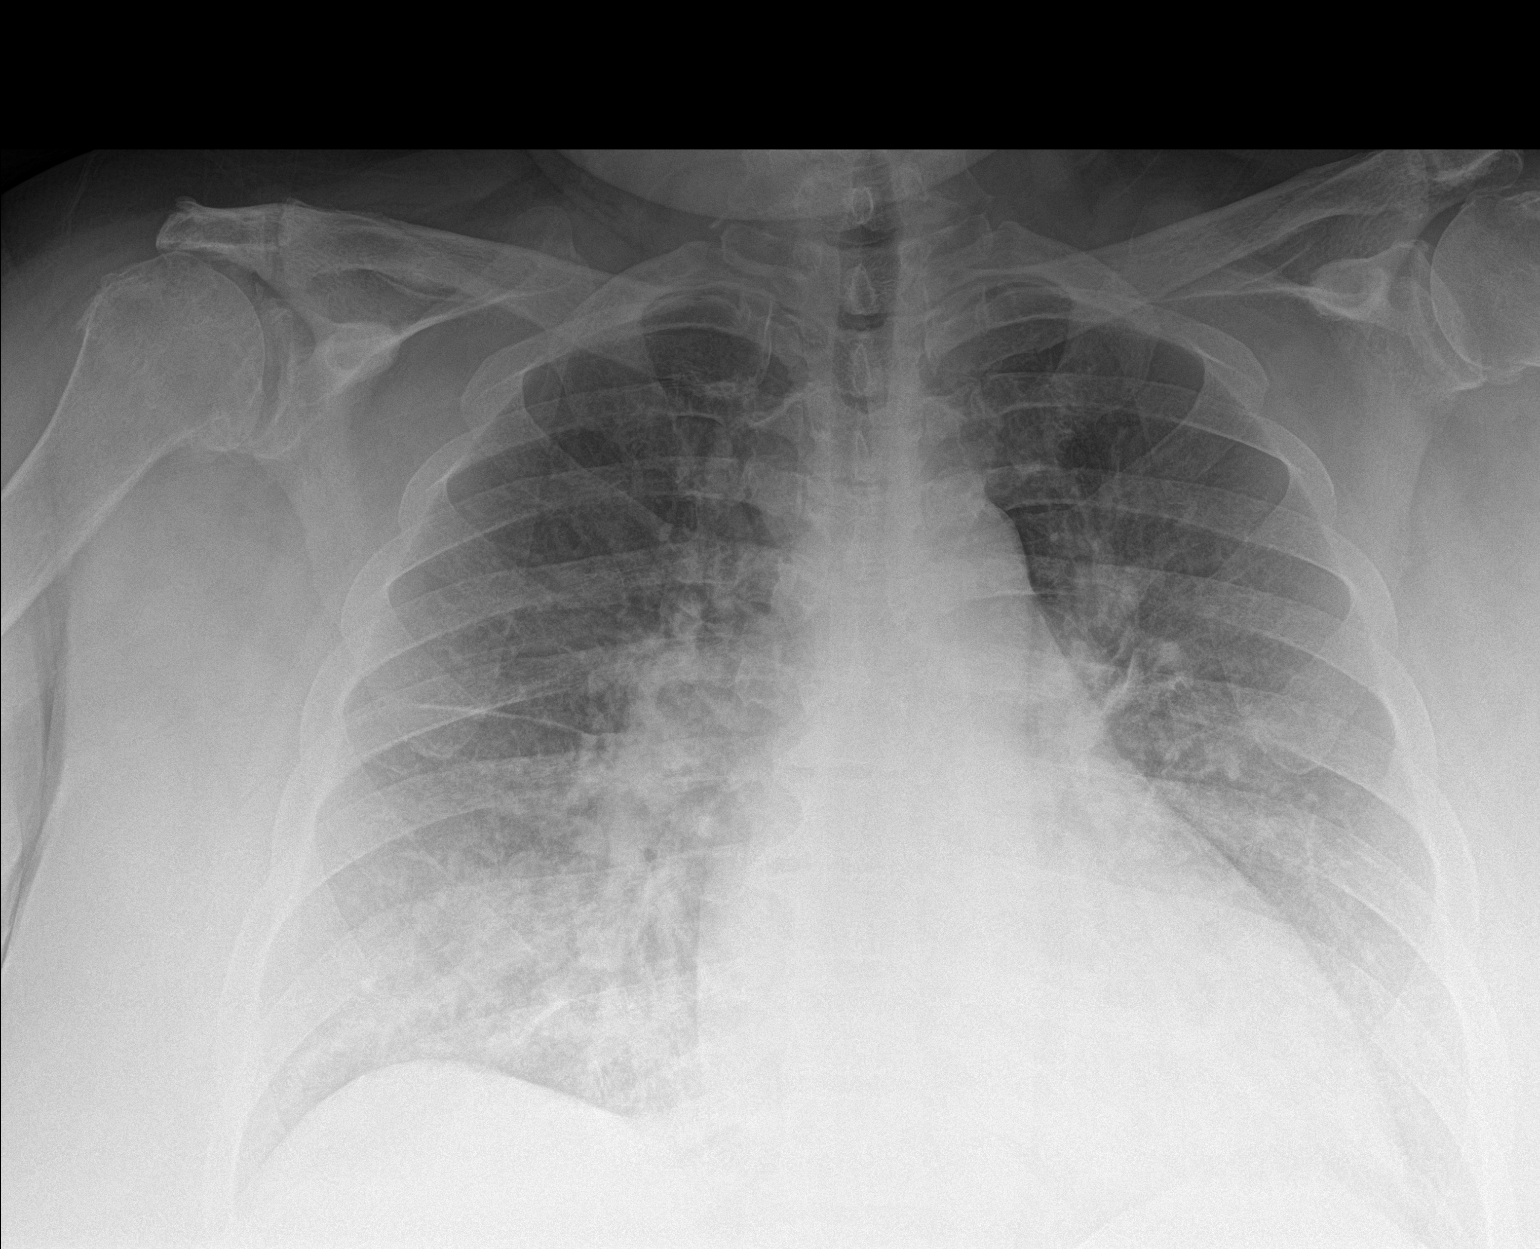

[1 of 1 positions shown; findings below may reference images not displayed]

FINDINGS: Cardiomegaly with pulmonary venous congestion. Bilateral pulmonary
infiltrates/edema. No pleural effusion or pneumothorax.
IMPRESSION: Cardiomegaly with pulmonary venous congestion. Bilateral pulmonary
infiltrates/edema. Findings most consistent with CHF.

## 2022-02-19 IMAGING — DX DG CHEST 1V PORT
1 series · 1 of 1 positions shown · non-contrast
Comparison: January 08, 2020.

CLINICAL DATA: Fever.

EXAM:
PORTABLE CHEST 1 VIEW

[chest ap]
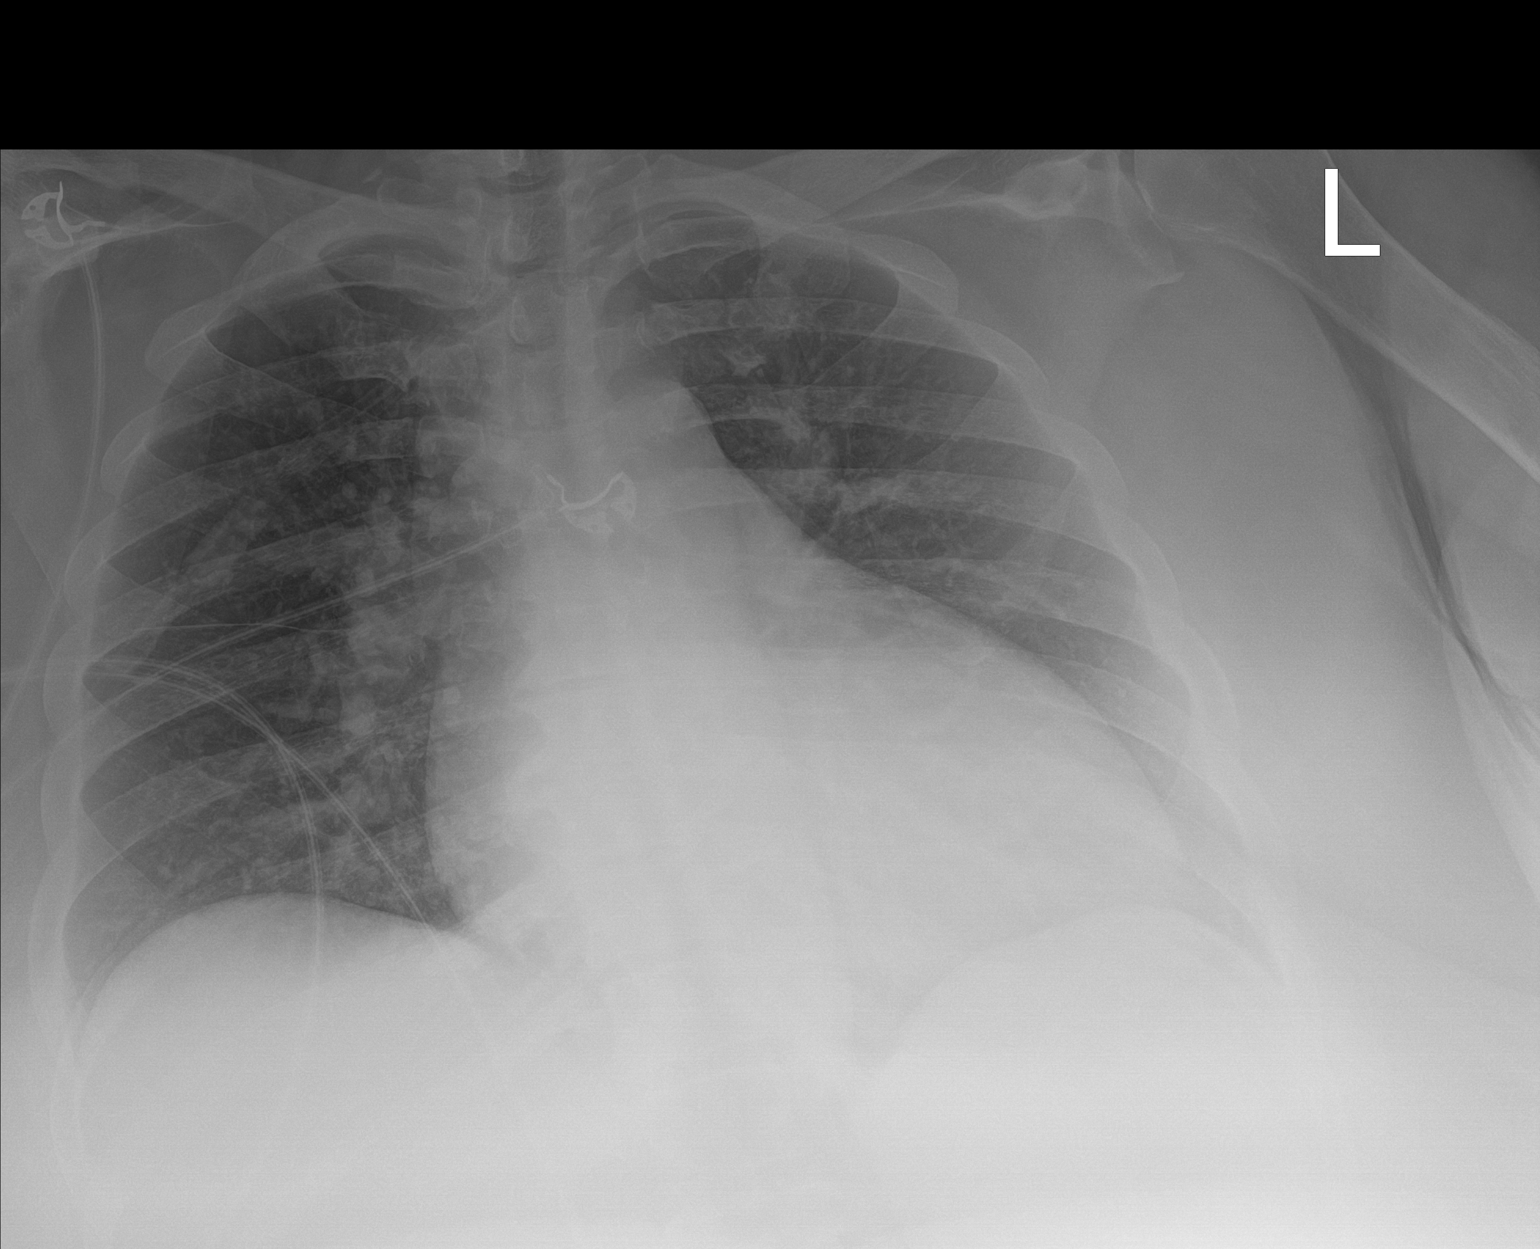

[1 of 1 positions shown; findings below may reference images not displayed]

FINDINGS: Enlarged cardiac silhouette. Both lungs are clear. No visible
pleural effusions or pneumothorax. No acute osseous abnormality.
IMPRESSION: 1. No acute cardiopulmonary disease.
2. Cardiomegaly.

## 2022-02-19 IMAGING — CT CT ABD-PELV W/O CM
2 of 4 series · 16 of 46 positions shown, 18 images · non-contrast
Comparison: 11/26/2019

CLINICAL DATA: Abdominal pain

EXAM:
CT ABDOMEN AND PELVIS WITHOUT CONTRAST
TECHNIQUE: Multidetector CT imaging of the abdomen and pelvis was performed
following the standard protocol without IV contrast.

[Series 3: a/p w/o 5mm · axial · non-contrast · 0.98mm/px · z∈[+927,+1377]mm · 13 of 100 slices shown, 15 images]
[im 5/100  soft-tissue]
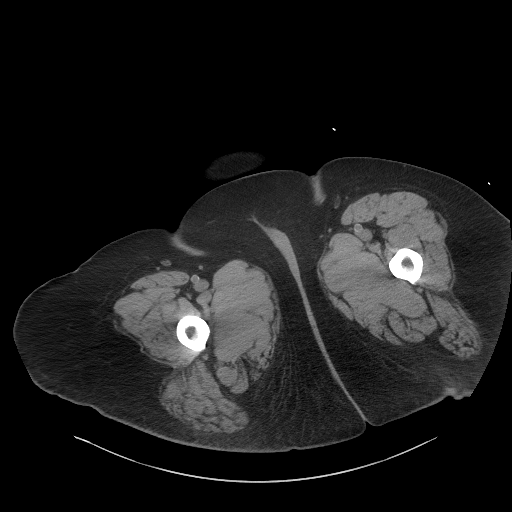
[im 5/100  bone]
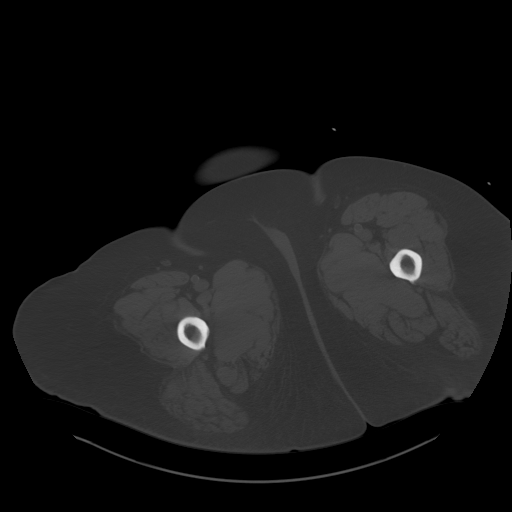
[im 13/100  soft-tissue]
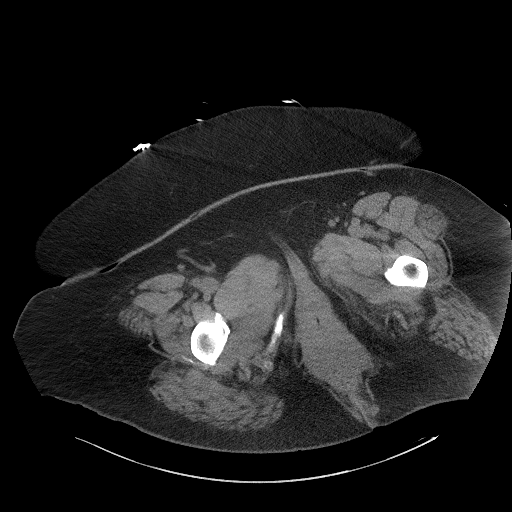
[im 21/100  soft-tissue]
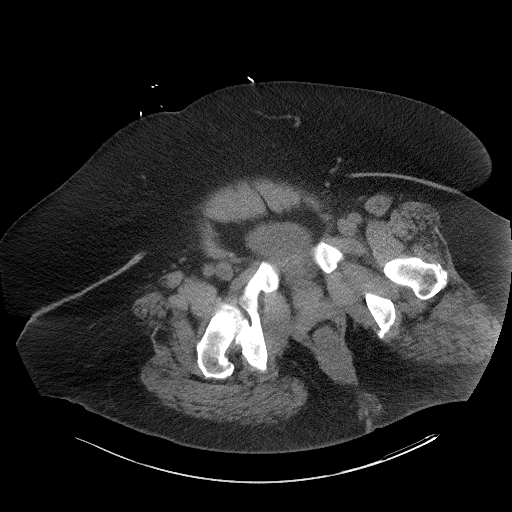
[im 29/100  soft-tissue]
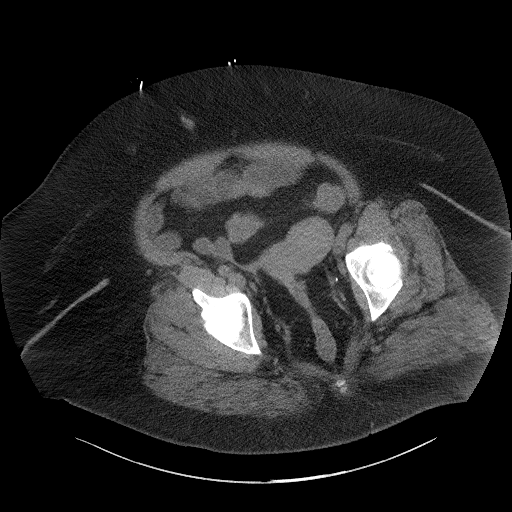
[im 34/100  soft-tissue]
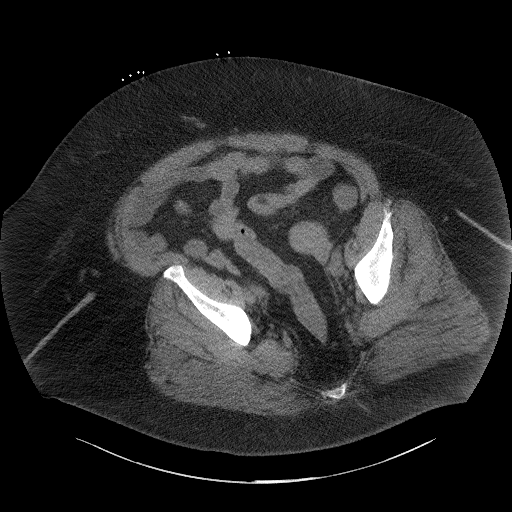
[im 42/100  soft-tissue]
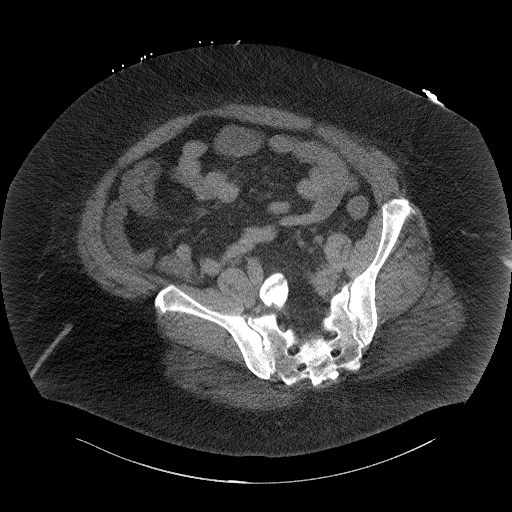
[im 50/100  soft-tissue]
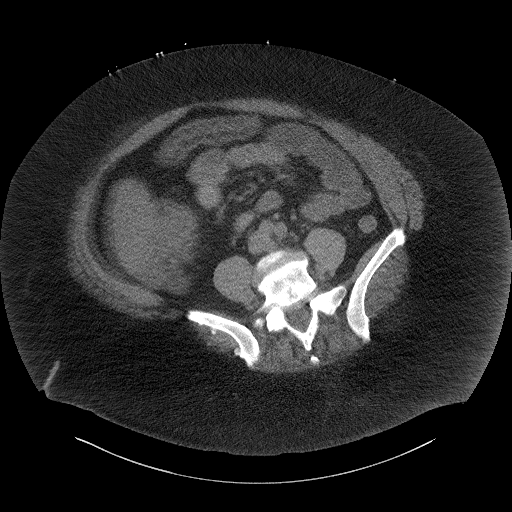
[im 58/100  soft-tissue]
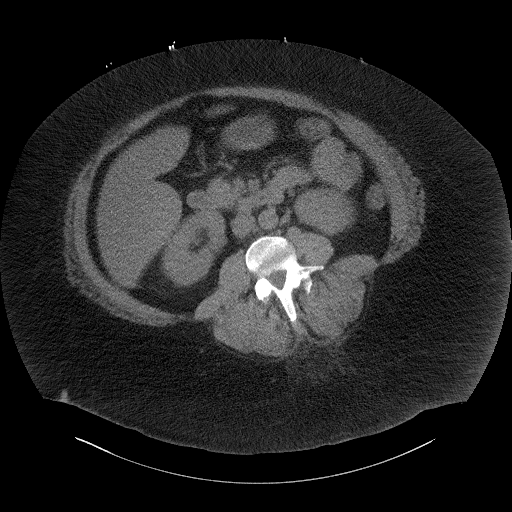
[im 67/100  soft-tissue]
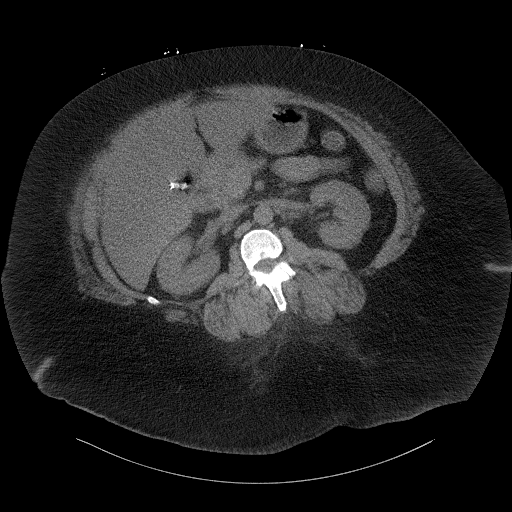
[im 67/100  bone]
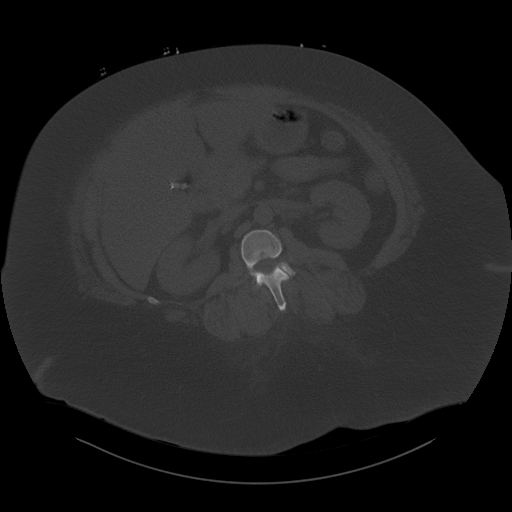
[im 71/100  soft-tissue]
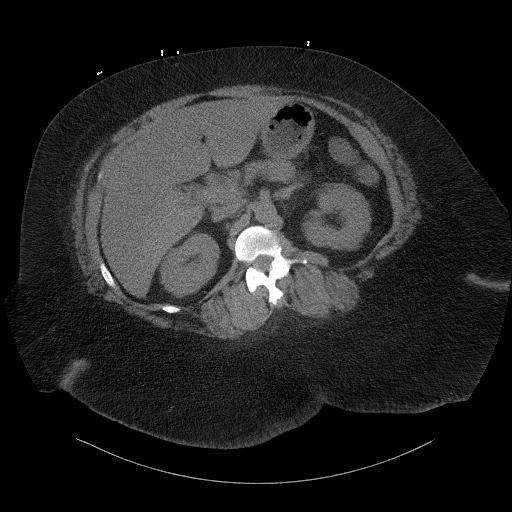
[im 79/100  soft-tissue]
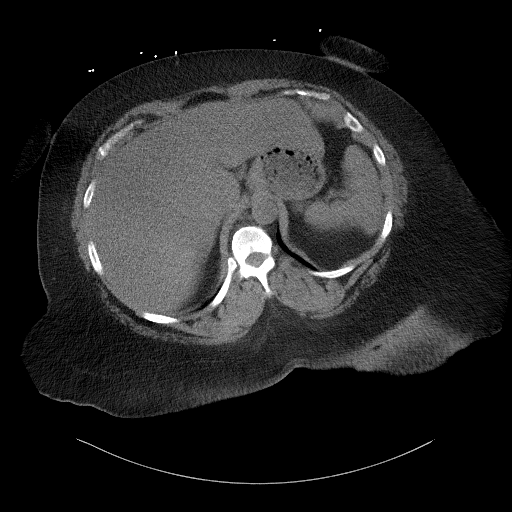
[im 87/100  soft-tissue]
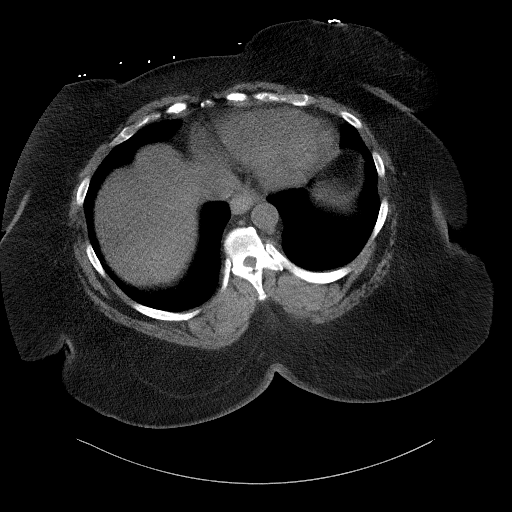
[im 95/100  soft-tissue]
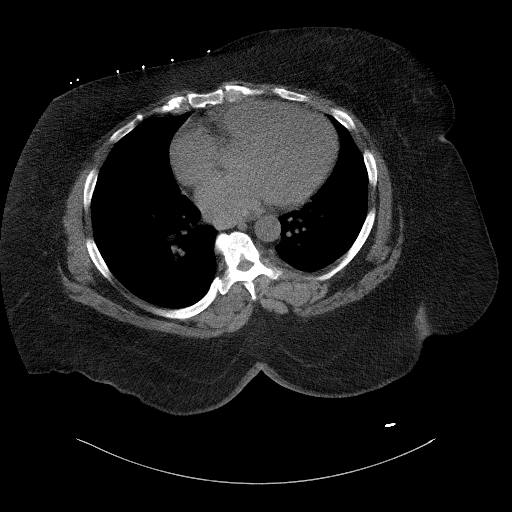

[Series 6: a/p w/o cor · coronal · non-contrast · 0.97mm/px · 3 of 232 slices shown]
[im 78/232  soft-tissue]
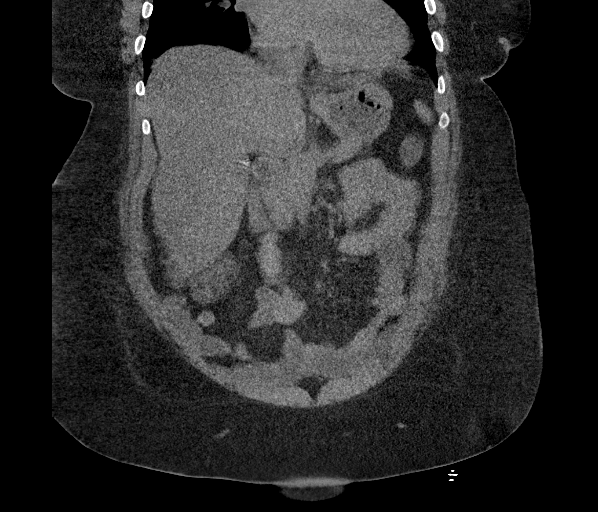
[im 103/232  soft-tissue]
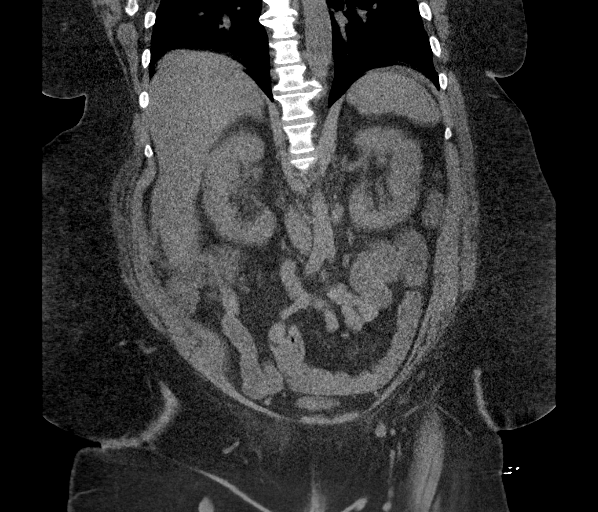
[im 129/232  soft-tissue]
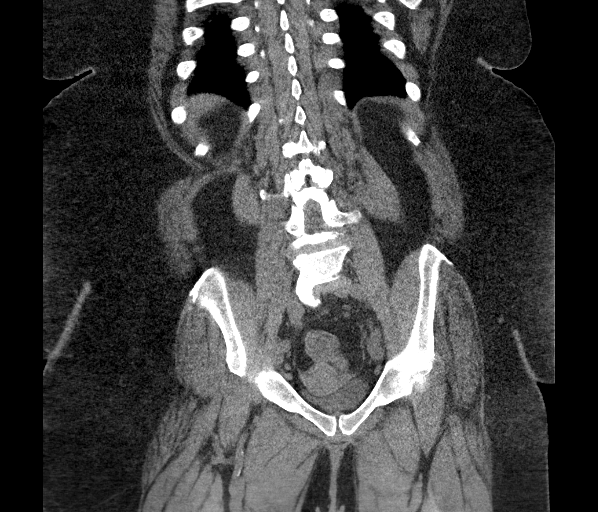

[16 of 46 positions shown; findings below may reference images not displayed]

FINDINGS: Lower chest: No acute abnormality.

Hepatobiliary: There is diffuse hepatic steatosis. No focal liver
abnormality identified. Cholecystectomy. No biliary dilatation.

Pancreas: Unremarkable. No pancreatic ductal dilatation or
surrounding inflammatory changes.

Spleen: Normal in size without focal abnormality.

Adrenals/Urinary Tract: Normal appearance of the adrenal glands.

No kidney mass, calcification, or hydronephrosis identified.

Bladder unremarkable.

Stomach/Bowel: Stomach is normal no abnormal small bowel wall
thickening, inflammation or distension. The: Appears diffusely
collapsed. Chronic intramural fatty deposition is identified and
appears similar to recent CT exams. Although nonspecific this may be
seen in the setting of chronic such as inflammatory bowel disease.
Mild diffuse colonic wall thickening is likely secondary to
incomplete distension. No secondary signs scratch set no pericolonic
inflammation or free fluid noted.

Vascular/Lymphatic: Mild aortic atherosclerosis. No aneurysm. No
abdominopelvic adenopathy.

Reproductive: Exophytic fibroid arising off the left fundus of the
uterus is again noted extending into the left adnexal region. Pelvic
organs are otherwise unremarkable.

Other: No free fluid or fluid collections.

Musculoskeletal: L5-S1 degenerative disc disease noted. No acute or
suspicious osseous findings.
IMPRESSION: 1. No acute findings identified within the abdomen or pelvis.
2. Chronic intramural fatty deposition within the colon which may be
seen in the setting of chronic inflammation.
3. Hepatic steatosis.
4. Uterine fibroid.
5. Aortic atherosclerosis.

Aortic Atherosclerosis ([id number]).

## 2022-02-23 ENCOUNTER — Encounter: Payer: Self-pay | Admitting: Registered Nurse

## 2022-02-23 ENCOUNTER — Encounter: Payer: Medicare Other | Attending: Registered Nurse | Admitting: Registered Nurse

## 2022-02-23 VITALS — BP 144/84 | HR 114 | Ht 66.0 in

## 2022-02-23 DIAGNOSIS — G8929 Other chronic pain: Secondary | ICD-10-CM

## 2022-02-23 DIAGNOSIS — Z79891 Long term (current) use of opiate analgesic: Secondary | ICD-10-CM | POA: Diagnosis present

## 2022-02-23 DIAGNOSIS — Z5181 Encounter for therapeutic drug level monitoring: Secondary | ICD-10-CM

## 2022-02-23 DIAGNOSIS — G893 Neoplasm related pain (acute) (chronic): Secondary | ICD-10-CM

## 2022-02-23 DIAGNOSIS — R000 Tachycardia, unspecified: Secondary | ICD-10-CM

## 2022-02-23 DIAGNOSIS — G894 Chronic pain syndrome: Secondary | ICD-10-CM | POA: Diagnosis present

## 2022-02-23 DIAGNOSIS — C7A010 Malignant carcinoid tumor of the duodenum: Secondary | ICD-10-CM

## 2022-02-23 DIAGNOSIS — M545 Low back pain, unspecified: Secondary | ICD-10-CM | POA: Insufficient documentation

## 2022-02-23 MED ORDER — HYDROMORPHONE HCL 4 MG PO TABS: 4.0000 mg | ORAL_TABLET | Freq: Two times a day (BID) | ORAL | 0 refills | Status: DC

## 2022-02-23 NOTE — Progress Notes (Signed)
Subjective:    Patient ID: [name], female    DOB: [birth year], 56 y.o.   MRN: [id number]  HPI: ANASTASIJA ANFINSON is a 56 y.o. female who returns for follow up appointment for chronic pain and medication refill. She states her pain is located in her abdomen and lower back pain. She rates her pain 10. Her current exercise regime is walking short distances in her home to the bathroom , otherwise she is wheelchair bound.   Ms. Kucinski Morphine equivalent is 53.33 MME.   Last UDS was Performed on 12/02/2021, it was consistent.    Pain Inventory Average Pain 10 Pain Right Now 10 My pain is sharp  In the last 24 hours, has pain interfered with the following? General activity 10 Relation with others 10 Enjoyment of life 10 What TIME of day is your pain at its worst? morning , daytime, evening, and night Sleep (in general) Poor  Pain is worse with: walking, bending, sitting, standing, and some activites Pain improves with: medication Relief from Meds: 5  Family History  Problem Relation Age of Onset   Diabetes Mother    Diabetes Father    Heart disease Father    Diabetes Sister    Congestive Heart Failure Sister 54   Diabetes Brother    Social History   Socioeconomic History   Marital status: Married    Spouse name: Not on file   Number of children: Not on file   Years of education: Not on file   Highest education level: Not on file  Occupational History   Not on file  Tobacco Use   Smoking status: Never   Smokeless tobacco: Never  Vaping Use   Vaping Use: Never used  Substance and Sexual Activity   Alcohol use: No   Drug use: No   Sexual activity: Not Currently    Birth control/protection: None  Other Topics Concern   Not on file  Social History Narrative   ** Merged History Encounter **       Social Determinants of Health   Financial Resource Strain: Not on file  Food Insecurity: Not on file  Transportation Needs: Not on file  Physical  Activity: Not on file  Stress: Not on file  Social Connections: Not on file   Past Surgical History:  Procedure Laterality Date   BIOPSY  07/27/2019   Procedure: BIOPSY;  Surgeon: Clarene Essex, MD;  Location: [facility];  Service: Endoscopy;;   BIOPSY  07/30/2019   Procedure: BIOPSY;  Surgeon: Otis Brace, MD;  Location: [facility];  Service: Gastroenterology;;   CATARACT EXTRACTION  01/2014   CHOLECYSTECTOMY     COLONOSCOPY WITH PROPOFOL N/A 07/30/2019   Procedure: COLONOSCOPY WITH PROPOFOL;  Surgeon: Otis Brace, MD;  Location: [facility];  Service: Gastroenterology;  Laterality: N/A;   ESOPHAGOGASTRODUODENOSCOPY N/A 07/27/2019   Procedure: ESOPHAGOGASTRODUODENOSCOPY (EGD);  Surgeon: Clarene Essex, MD;  Location: [facility];  Service: Endoscopy;  Laterality: N/A;   ESOPHAGOGASTRODUODENOSCOPY N/A 07/26/2020   Procedure: ESOPHAGOGASTRODUODENOSCOPY (EGD);  Surgeon: Arta Silence, MD;  Location: [facility];  Service: Endoscopy;  Laterality: N/A;   ESOPHAGOGASTRODUODENOSCOPY (EGD) WITH PROPOFOL N/A 08/02/2019   Procedure: ESOPHAGOGASTRODUODENOSCOPY (EGD) WITH PROPOFOL;  Surgeon: Otis Brace, MD;  Location: [facility];  Service: Gastroenterology;  Laterality: N/A;   HEMOSTASIS CLIP PLACEMENT  08/02/2019   Procedure: HEMOSTASIS CLIP PLACEMENT;  Surgeon: Otis Brace, MD;  Location: [facility];  Service: Gastroenterology;;   POLYPECTOMY  07/30/2019   Procedure: POLYPECTOMY;  Surgeon: Alessandra Bevels,  Orson Gear, MD;  Location: [facility];  Service: Gastroenterology;;   POLYPECTOMY  08/02/2019   Procedure: POLYPECTOMY;  Surgeon: Otis Brace, MD;  Location: [facility];  Service: Gastroenterology;;   Past Surgical History:  Procedure Laterality Date   BIOPSY  07/27/2019   Procedure: BIOPSY;  Surgeon: Clarene Essex, MD;  Location: [facility];  Service: Endoscopy;;   BIOPSY  07/30/2019   Procedure: BIOPSY;  Surgeon: Otis Brace, MD;  Location: [facility];   Service: Gastroenterology;;   CATARACT EXTRACTION  01/2014   CHOLECYSTECTOMY     COLONOSCOPY WITH PROPOFOL N/A 07/30/2019   Procedure: COLONOSCOPY WITH PROPOFOL;  Surgeon: Otis Brace, MD;  Location: [facility];  Service: Gastroenterology;  Laterality: N/A;   ESOPHAGOGASTRODUODENOSCOPY N/A 07/27/2019   Procedure: ESOPHAGOGASTRODUODENOSCOPY (EGD);  Surgeon: Clarene Essex, MD;  Location: [facility];  Service: Endoscopy;  Laterality: N/A;   ESOPHAGOGASTRODUODENOSCOPY N/A 07/26/2020   Procedure: ESOPHAGOGASTRODUODENOSCOPY (EGD);  Surgeon: Arta Silence, MD;  Location: [facility];  Service: Endoscopy;  Laterality: N/A;   ESOPHAGOGASTRODUODENOSCOPY (EGD) WITH PROPOFOL N/A 08/02/2019   Procedure: ESOPHAGOGASTRODUODENOSCOPY (EGD) WITH PROPOFOL;  Surgeon: Otis Brace, MD;  Location: [facility];  Service: Gastroenterology;  Laterality: N/A;   HEMOSTASIS CLIP PLACEMENT  08/02/2019   Procedure: HEMOSTASIS CLIP PLACEMENT;  Surgeon: Otis Brace, MD;  Location: [facility];  Service: Gastroenterology;;   POLYPECTOMY  07/30/2019   Procedure: POLYPECTOMY;  Surgeon: Otis Brace, MD;  Location: [facility];  Service: Gastroenterology;;   POLYPECTOMY  08/02/2019   Procedure: POLYPECTOMY;  Surgeon: Otis Brace, MD;  Location: [facility];  Service: Gastroenterology;;   Past Medical History:  Diagnosis Date   Acute back pain with sciatica, left    Acute back pain with sciatica, right    AKI (acute kidney injury) ([place])    Anemia, unspecified    Cancer ([place])    Carcinoid tumor of duodenum    Chest pain with normal coronary angiography 2019   Chronic kidney disease, stage 3b ([place])    Chronic pain    Chronic systolic CHF (congestive heart failure) ([place])    Diabetes mellitus    DKA (diabetic ketoacidosis) ([place])    Drug-seeking behavior    21 hospitalizations and 14 CT a/p in 2 years for N/V and abdominal pain, demanding only IV dilaudid   Elevated troponin    chronic    Esophageal reflux    Fibromyalgia    Gastric ulcer    Gastroparesis    Gout    Hyperlipidemia    Hypertension    Hypokalemia    Hypomagnesemia    Lumbosacral stenosis    LVH (left ventricular hypertrophy)    Morbid obesity ([facility])    NICM (nonischemic cardiomyopathy) ([facility])    PAF (paroxysmal atrial fibrillation) ([place])    Stroke ([place]) 02/2011   Thrombocytosis    Vitamin B12 deficiency anemia    BP (!) 144/84   Pulse (!) 114   Ht '5\' 6"'$  (1.676 m)   LMP 10/10/2012   SpO2 94%   BMI 47.25 kg/m   Opioid Risk Score:   Fall Risk Score:  `1  Depression screen [facility] 2/9     09/16/2021    9:16 AM 06/01/2021    1:55 PM 08/01/2020    2:56 PM 06/05/2020    3:01 PM 01/04/2020    2:20 PM 11/15/2019    3:35 PM 11/14/2019    2:04 PM  Depression screen PHQ 2/9  Decreased Interest '3 1 1 1 2 3 1  '$ Down, Depressed, Hopeless 3 1 1  $'1 3 3 1  'R$ PHQ - 2 Score '6 2 2 2 5 6 2  '$ Altered sleeping     3 3   Tired, decreased energy     3 3   Change in appetite     3 3   Feeling bad or failure about yourself      3 3   Trouble concentrating     3 3   Moving slowly or fidgety/restless     3 3   Suicidal thoughts     0 3   PHQ-9 Score     23 27       Review of Systems  Musculoskeletal:  Positive for back pain and gait problem.  All other systems reviewed and are negative.     Objective:   Physical Exam Vitals and nursing note reviewed.  Constitutional:      Appearance: Normal appearance.  Cardiovascular:     Rate and Rhythm: Normal rate and regular rhythm.     Pulses: Normal pulses.     Heart sounds: Normal heart sounds.  Pulmonary:     Effort: Pulmonary effort is normal.     Breath sounds: Normal breath sounds.  Musculoskeletal:     Cervical back: Normal range of motion and neck supple.     Comments: Normal Muscle Bulk and Muscle Testing Reveals:  Upper Extremities: Full ROM and Muscle Strength 5/5  Lumbar Paraspinal Tenderness: L-4-L-5 Lower Extremities: Decreased ROM and Muscle Strength  5/5 Left lower Extremity Flexion Produces Pain into her Left Patella Arrived in wheelchair     Skin:    General: Skin is warm and dry.  Neurological:     Mental Status: She is alert and oriented to person, place, and time.  Psychiatric:        Mood and Affect: Mood normal.        Behavior: Behavior normal.         Assessment & Plan:  1. 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 02/23/2022 2. Chronic Pain Syndrome: Refilled Dilaudid 4 mg one tablet twice a day as needed for sever pain., may take a extra tablet 10 days out of the month when pain is sever.  #80.Second script sent for the following month.  We will continue the opioid monitoring program, this consists of regular clinic visits, examinations, urine drug screen, pill counts as well as use of [place] Controlled Substance Reporting system. A 12 month History has been reviewed on the [place] Controlled Substance Reporting System on 02/23/2022. 3. Chronic Bilateral Lower Back Pain without sciatica: Continue HEP as Tolerated. Continue to Monitor. 02/23/2022  4. Muscle Spasm: Flexeril 10 mg one tablet three times a day as needed #90. Continue to monitor. 02/23/2022 5. Tachycardia: She states she is compliant with her medication. Apical pulse checked. She will F/U with her PCP.   F/U in 2 months

## 2022-03-03 IMAGING — DX DG CHEST 1V PORT
1 series · 1 of 1 positions shown · non-contrast
Comparison: January 11, 2020.

CLINICAL DATA: [name].  Chest pain.

EXAM:
PORTABLE CHEST 1 VIEW

[chest ap]
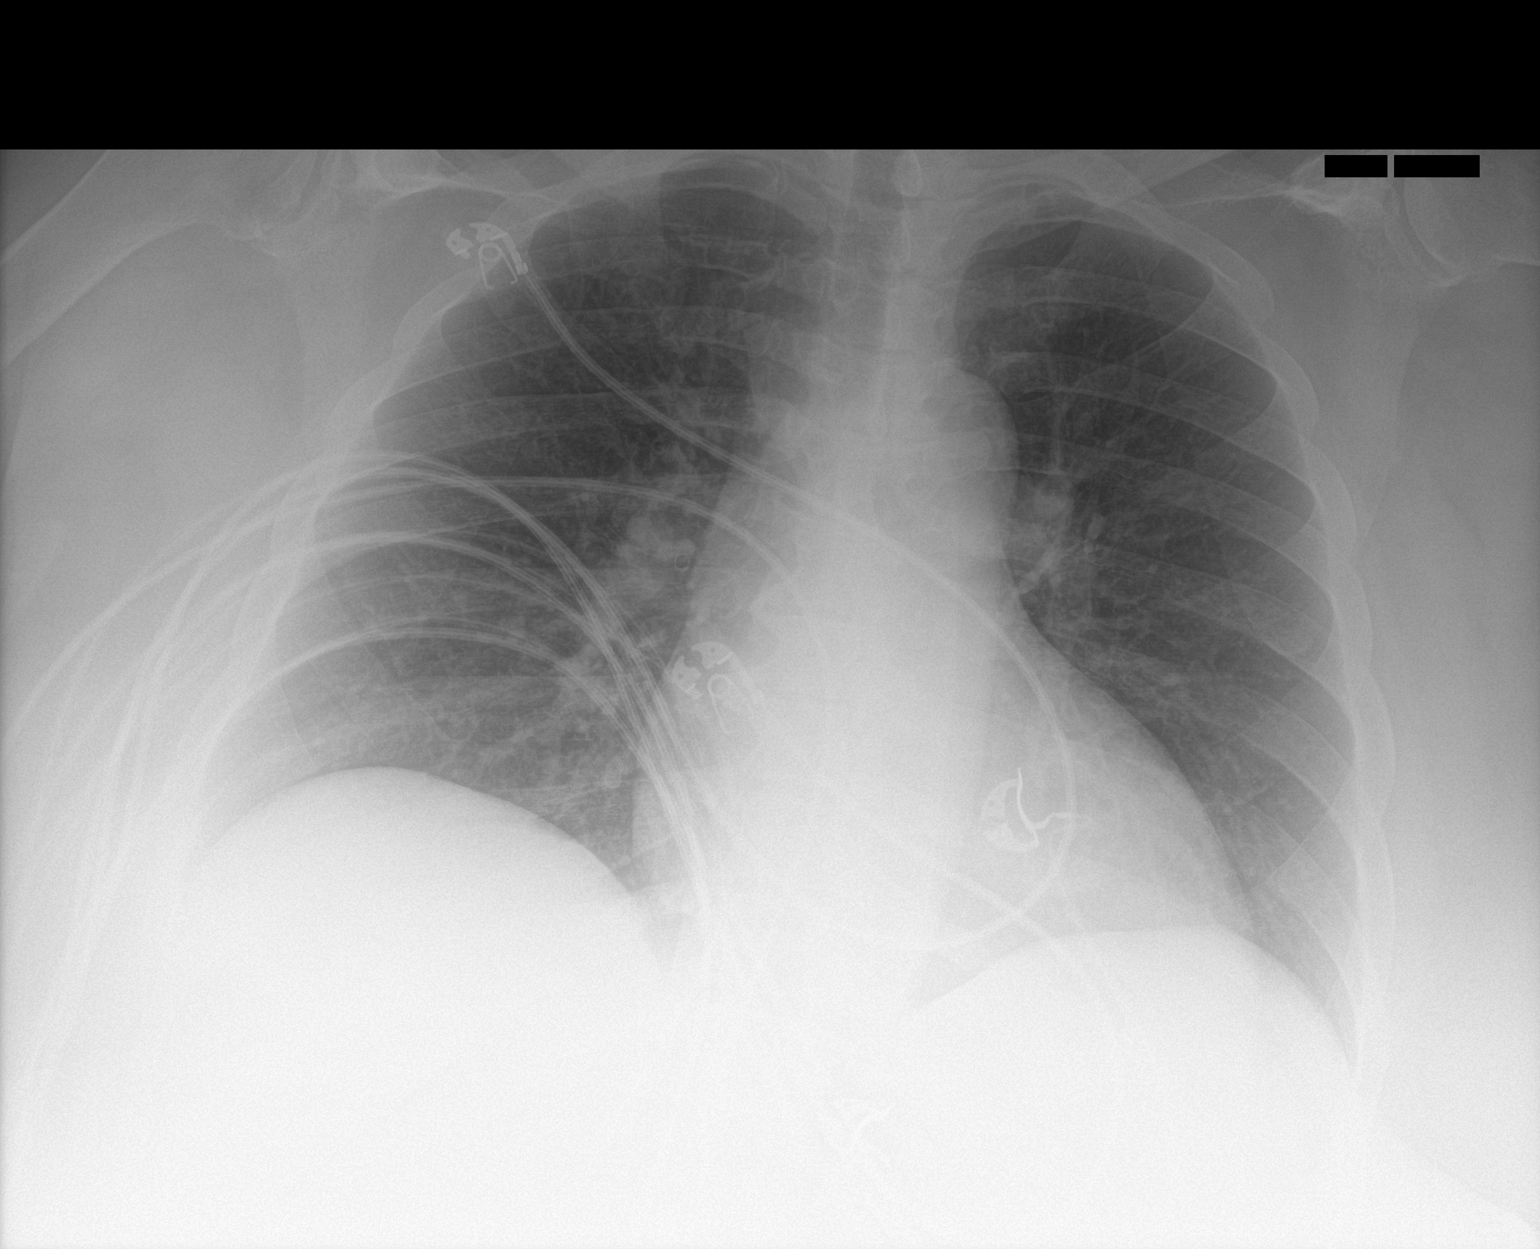

[1 of 1 positions shown; findings below may reference images not displayed]

FINDINGS: The heart size and mediastinal contours are within normal limits. No
consolidation. No visible pleural effusions or pneumothorax. No
acute osseous abnormality.
IMPRESSION: No acute cardiopulmonary disease.

## 2022-03-05 IMAGING — US US RENAL
1 series · 14 of 25 positions shown · non-contrast
Comparison: None.

CLINICAL DATA: Acute renal insufficiency

EXAM:
RENAL / URINARY TRACT ULTRASOUND COMPLETE

[Series 1: us renal · 14 of 53 slices shown]
[im 1/53]
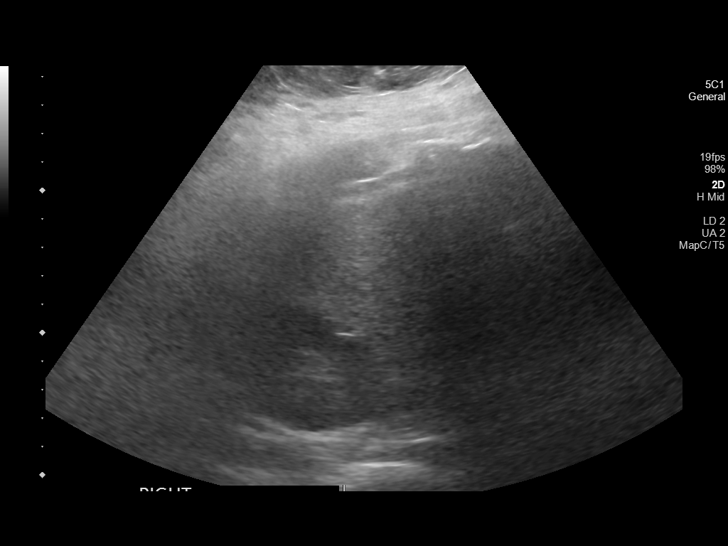
[im 5/53]
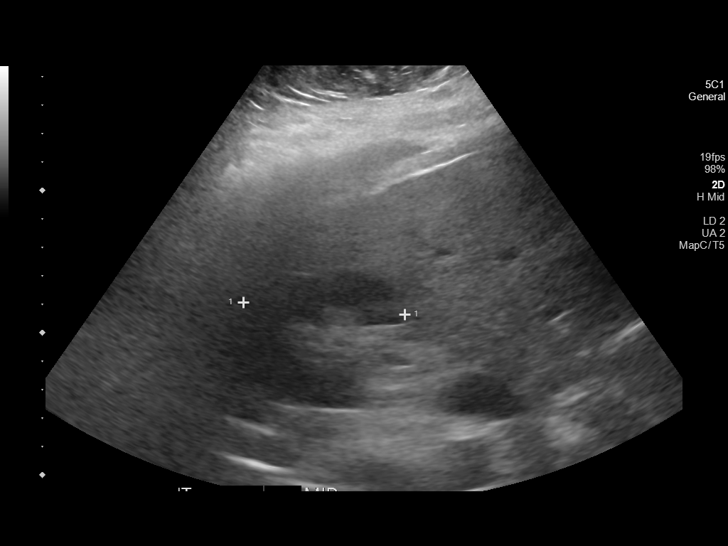
[im 9/53]
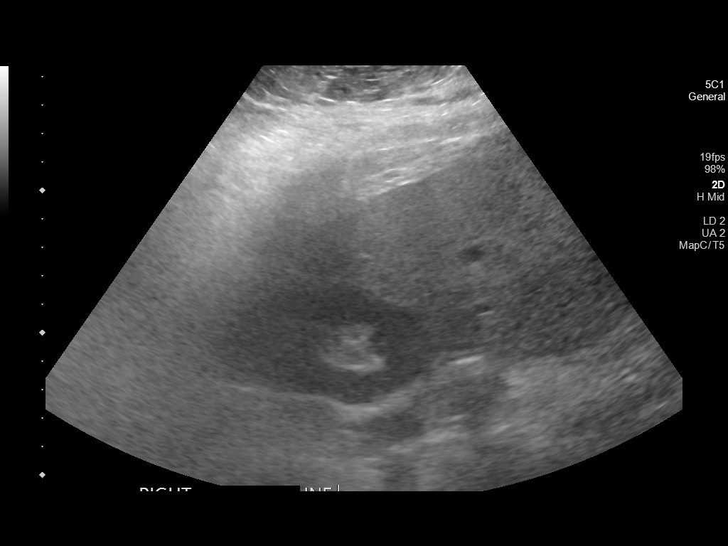
[im 14/53]
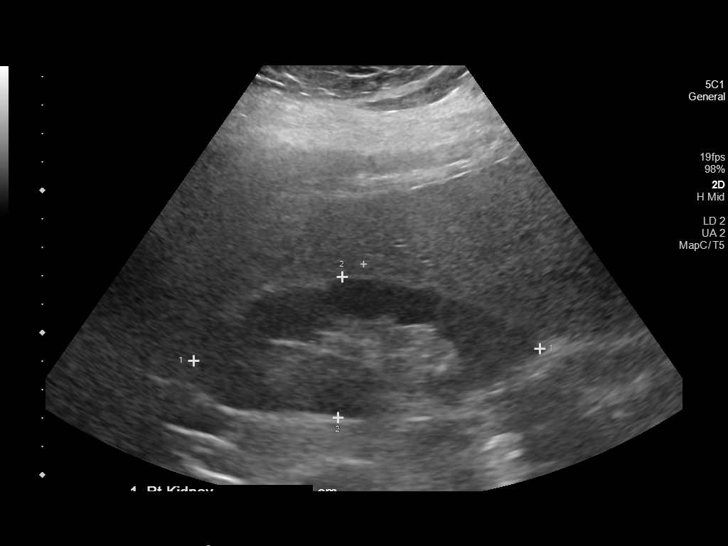
[im 18/53]
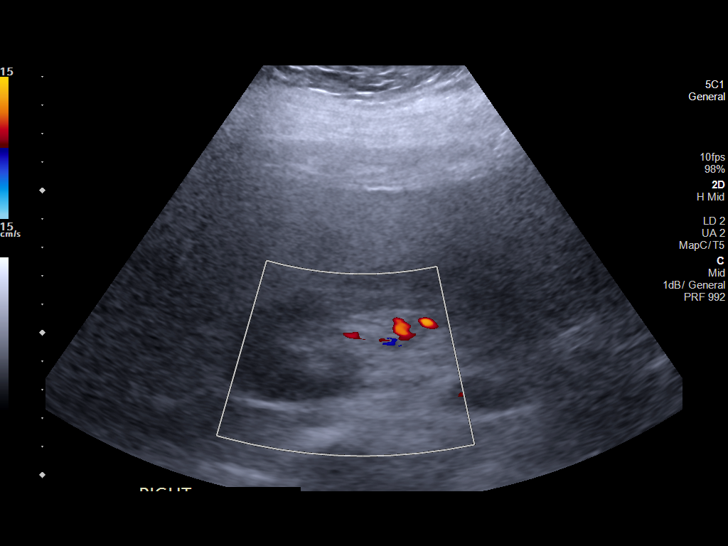
[im 20/53]
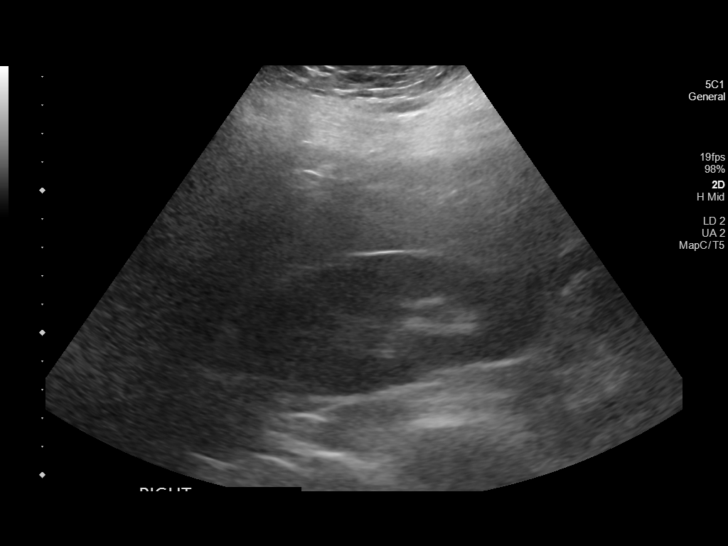
[im 24/53]
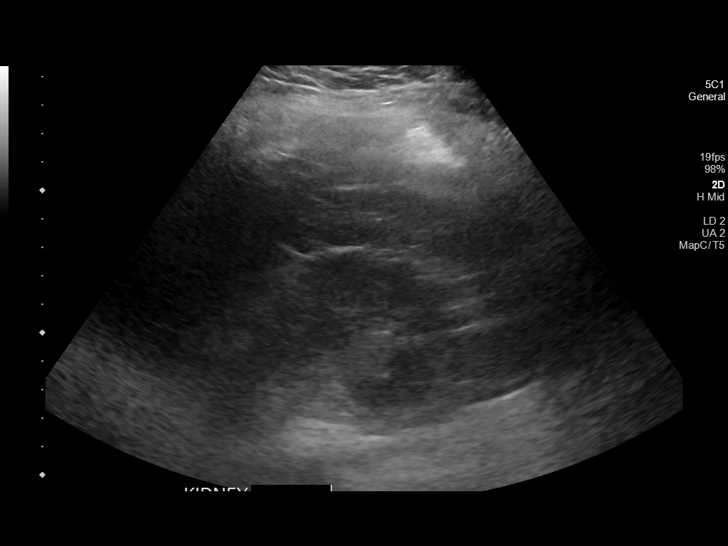
[im 29/53]
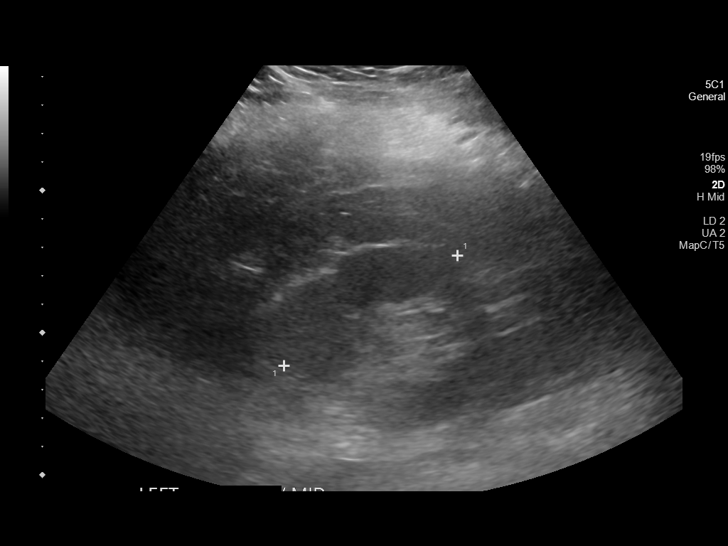
[im 33/53]
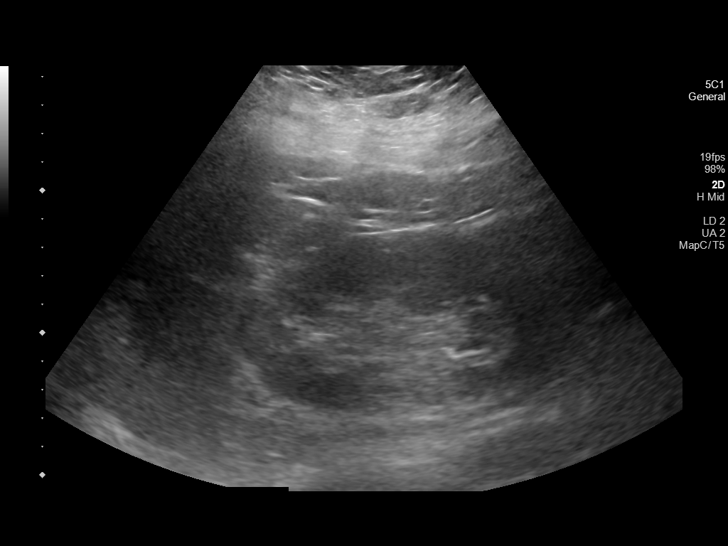
[im 35/53]
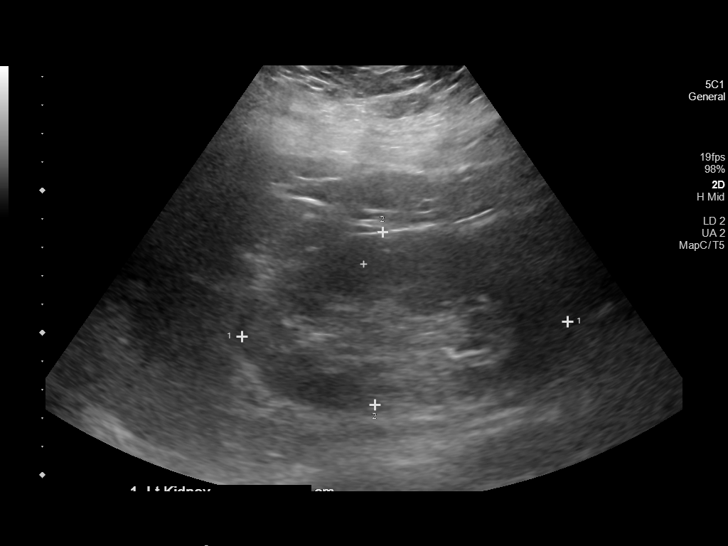
[im 40/53]
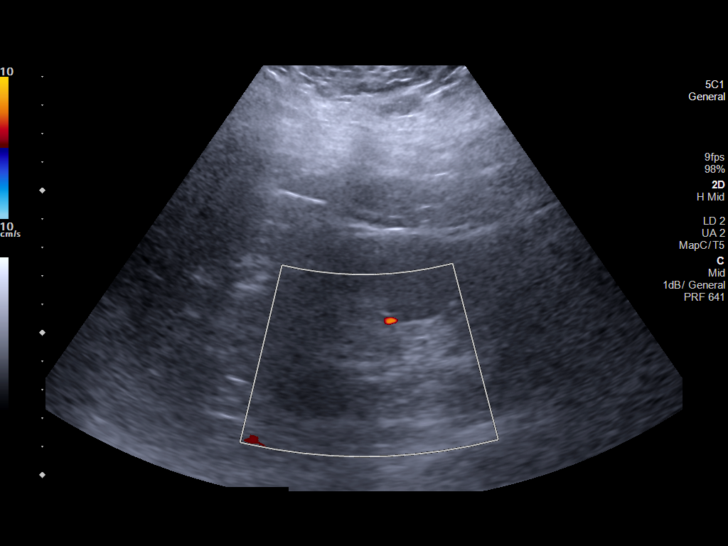
[im 44/53]
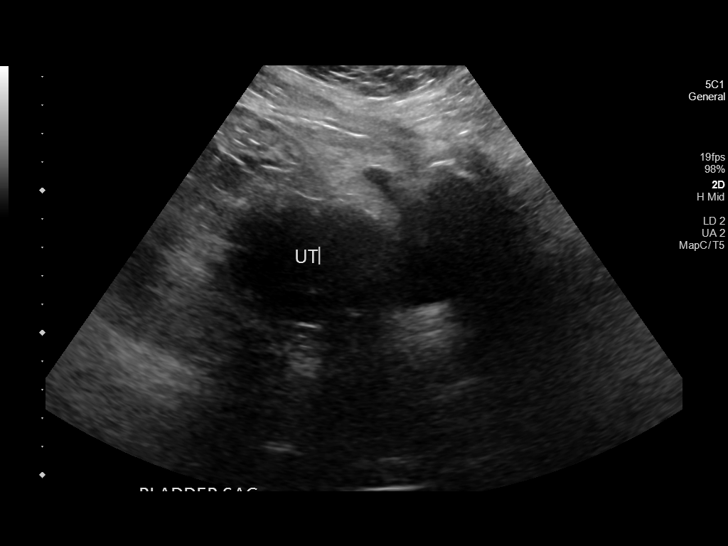
[im 48/53]
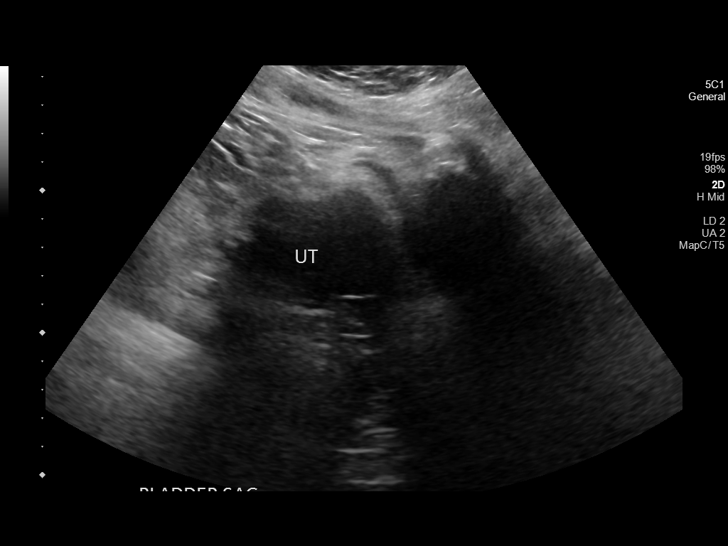
[im 53/53]
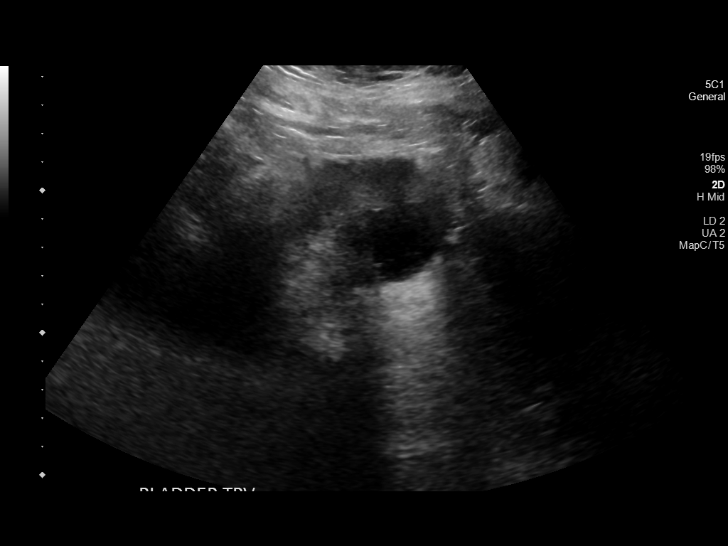

[14 of 25 positions shown; findings below may reference images not displayed]

FINDINGS: Right Kidney:

Renal measurements: 12.2 x 5.0 x 5.7 cm = volume: 179.6 mL.
Echogenicity within normal limits. No mass or hydronephrosis
visualized.

Left Kidney:

Renal measurements: 11.5 x 6.1 x 7.2 cm = volume: 263.4 mL.
Echogenicity within normal limits. No mass or hydronephrosis
visualized.

Bladder:

The bladder is not assessed due to lack of distention.

Other:

None.
IMPRESSION: The bladder is not assessed due to lack of distention. No renal
abnormalities identified.

## 2022-03-08 IMAGING — DX DG CHEST 1V PORT
1 series · 1 of 1 positions shown · non-contrast
Comparison: 01/23/2020

CLINICAL DATA: [name]

EXAM:
PORTABLE CHEST 1 VIEW

[chest ap]
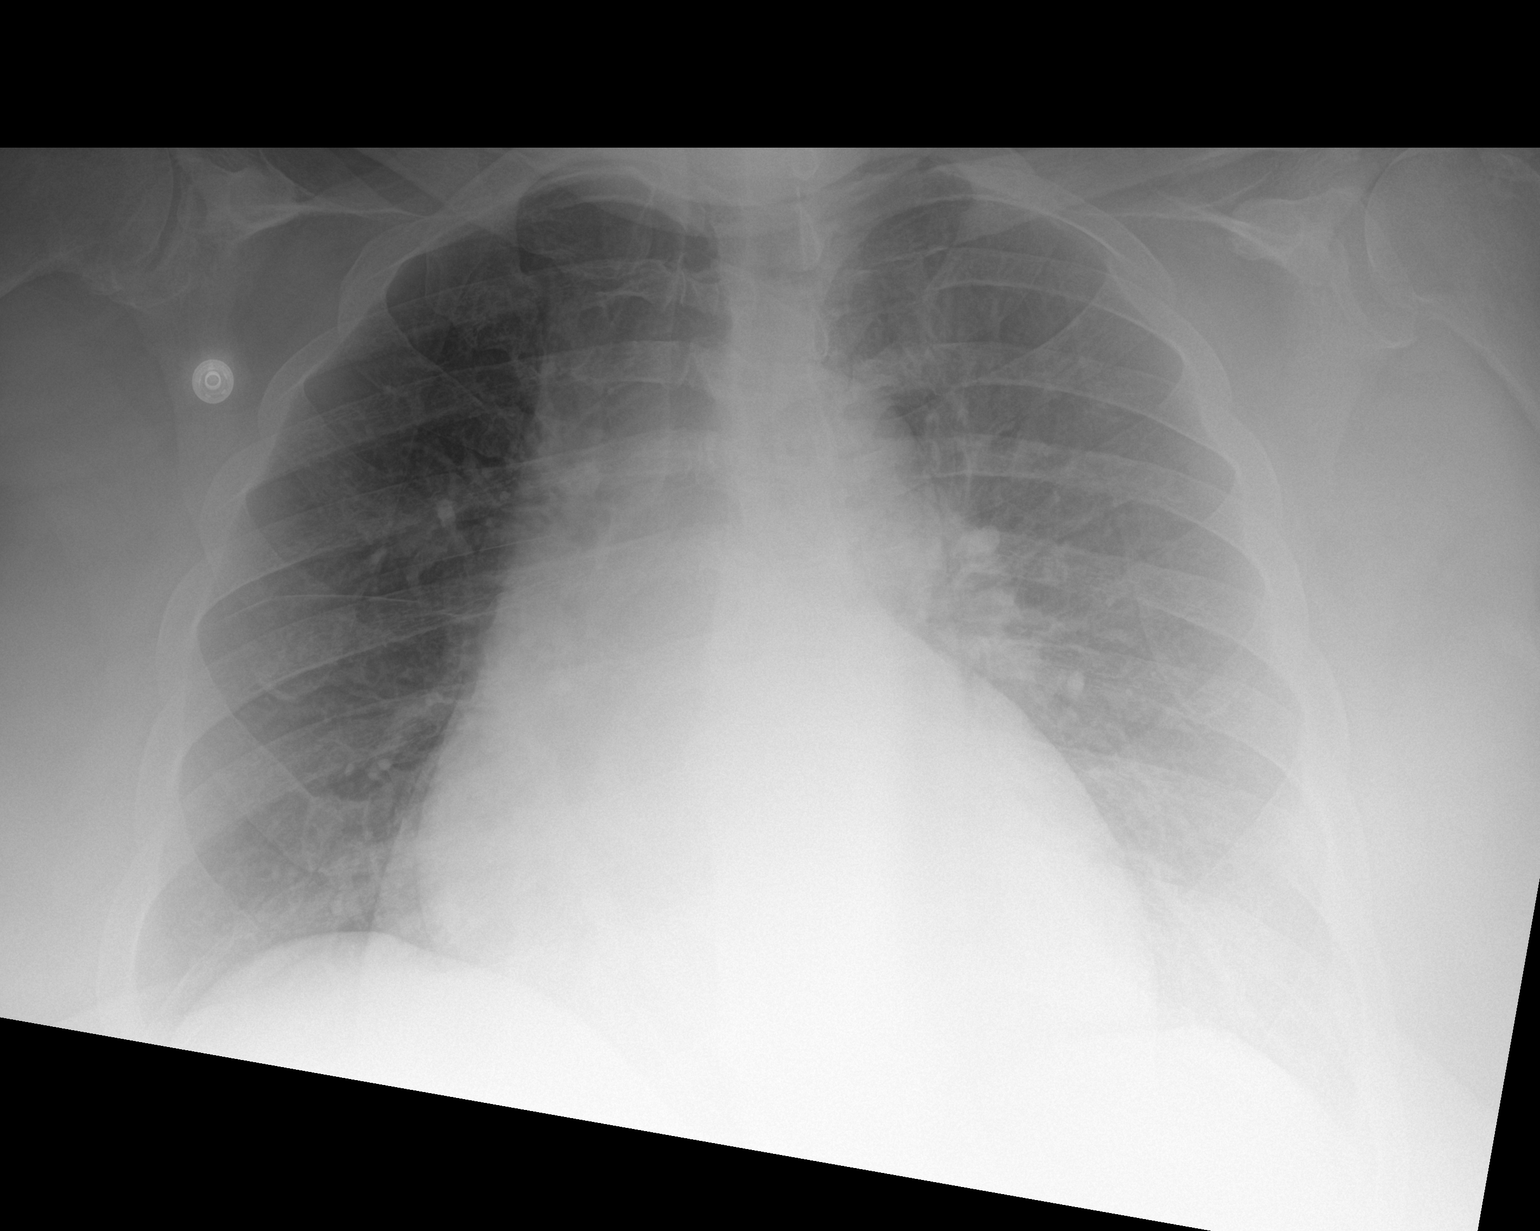

[1 of 1 positions shown; findings below may reference images not displayed]

FINDINGS: Shallow inspiration. Cardiac enlargement. Lungs are clear. No
pleural effusions. No pneumothorax. Mediastinal contours appear
intact.
IMPRESSION: Cardiac enlargement. No evidence of active pulmonary disease.

## 2022-03-09 ENCOUNTER — Other Ambulatory Visit: Payer: Self-pay | Admitting: Physical Medicine and Rehabilitation

## 2022-03-17 ENCOUNTER — Other Ambulatory Visit: Payer: Self-pay

## 2022-03-17 ENCOUNTER — Emergency Department (HOSPITAL_COMMUNITY): Payer: 59

## 2022-03-17 ENCOUNTER — Encounter (HOSPITAL_COMMUNITY): Payer: Self-pay

## 2022-03-17 ENCOUNTER — Inpatient Hospital Stay (HOSPITAL_COMMUNITY)
Admission: EM | Admit: 2022-03-17 | Discharge: 2022-03-22 | DRG: 683 | Disposition: A | Discharge status: Home / Self Care | Payer: 59 | Attending: Family Medicine | Admitting: Family Medicine

## 2022-03-17 DIAGNOSIS — I5042 Chronic combined systolic (congestive) and diastolic (congestive) heart failure: Secondary | ICD-10-CM | POA: Diagnosis present

## 2022-03-17 DIAGNOSIS — E860 Dehydration: Secondary | ICD-10-CM | POA: Diagnosis present

## 2022-03-17 DIAGNOSIS — E1143 Type 2 diabetes mellitus with diabetic autonomic (poly)neuropathy: Secondary | ICD-10-CM | POA: Diagnosis present

## 2022-03-17 DIAGNOSIS — D72829 Elevated white blood cell count, unspecified: Secondary | ICD-10-CM | POA: Diagnosis present

## 2022-03-17 DIAGNOSIS — M797 Fibromyalgia: Secondary | ICD-10-CM | POA: Diagnosis present

## 2022-03-17 DIAGNOSIS — R000 Tachycardia, unspecified: Secondary | ICD-10-CM | POA: Diagnosis present

## 2022-03-17 DIAGNOSIS — K3184 Gastroparesis: Secondary | ICD-10-CM | POA: Diagnosis present

## 2022-03-17 DIAGNOSIS — M549 Dorsalgia, unspecified: Secondary | ICD-10-CM | POA: Diagnosis present

## 2022-03-17 DIAGNOSIS — Z7901 Long term (current) use of anticoagulants: Secondary | ICD-10-CM

## 2022-03-17 DIAGNOSIS — M4807 Spinal stenosis, lumbosacral region: Secondary | ICD-10-CM | POA: Diagnosis present

## 2022-03-17 DIAGNOSIS — G8929 Other chronic pain: Secondary | ICD-10-CM | POA: Diagnosis present

## 2022-03-17 DIAGNOSIS — G894 Chronic pain syndrome: Secondary | ICD-10-CM | POA: Diagnosis present

## 2022-03-17 DIAGNOSIS — Z794 Long term (current) use of insulin: Secondary | ICD-10-CM

## 2022-03-17 DIAGNOSIS — Z886 Allergy status to analgesic agent status: Secondary | ICD-10-CM

## 2022-03-17 DIAGNOSIS — Z6841 Body Mass Index (BMI) 40.0 and over, adult: Secondary | ICD-10-CM

## 2022-03-17 DIAGNOSIS — E875 Hyperkalemia: Secondary | ICD-10-CM

## 2022-03-17 DIAGNOSIS — J309 Allergic rhinitis, unspecified: Secondary | ICD-10-CM | POA: Diagnosis present

## 2022-03-17 DIAGNOSIS — I130 Hypertensive heart and chronic kidney disease with heart failure and stage 1 through stage 4 chronic kidney disease, or unspecified chronic kidney disease: Secondary | ICD-10-CM | POA: Diagnosis present

## 2022-03-17 DIAGNOSIS — Z85060 Personal history of malignant carcinoid tumor of small intestine: Secondary | ICD-10-CM

## 2022-03-17 DIAGNOSIS — D75839 Thrombocytosis, unspecified: Secondary | ICD-10-CM | POA: Diagnosis present

## 2022-03-17 DIAGNOSIS — E785 Hyperlipidemia, unspecified: Secondary | ICD-10-CM | POA: Diagnosis present

## 2022-03-17 DIAGNOSIS — Z8711 Personal history of peptic ulcer disease: Secondary | ICD-10-CM

## 2022-03-17 DIAGNOSIS — Z8249 Family history of ischemic heart disease and other diseases of the circulatory system: Secondary | ICD-10-CM

## 2022-03-17 DIAGNOSIS — N170 Acute kidney failure with tubular necrosis: Principal | ICD-10-CM | POA: Diagnosis present

## 2022-03-17 DIAGNOSIS — Z91148 Patient's other noncompliance with medication regimen for other reason: Secondary | ICD-10-CM

## 2022-03-17 DIAGNOSIS — I5020 Unspecified systolic (congestive) heart failure: Secondary | ICD-10-CM | POA: Diagnosis present

## 2022-03-17 DIAGNOSIS — Z833 Family history of diabetes mellitus: Secondary | ICD-10-CM

## 2022-03-17 DIAGNOSIS — E1122 Type 2 diabetes mellitus with diabetic chronic kidney disease: Secondary | ICD-10-CM | POA: Diagnosis present

## 2022-03-17 DIAGNOSIS — Z888 Allergy status to other drugs, medicaments and biological substances status: Secondary | ICD-10-CM

## 2022-03-17 DIAGNOSIS — N189 Chronic kidney disease, unspecified: Secondary | ICD-10-CM | POA: Diagnosis present

## 2022-03-17 DIAGNOSIS — N184 Chronic kidney disease, stage 4 (severe): Secondary | ICD-10-CM | POA: Diagnosis present

## 2022-03-17 DIAGNOSIS — Z79899 Other long term (current) drug therapy: Secondary | ICD-10-CM

## 2022-03-17 DIAGNOSIS — E119 Type 2 diabetes mellitus without complications: Secondary | ICD-10-CM

## 2022-03-17 DIAGNOSIS — E874 Mixed disorder of acid-base balance: Secondary | ICD-10-CM | POA: Diagnosis present

## 2022-03-17 DIAGNOSIS — E871 Hypo-osmolality and hyponatremia: Secondary | ICD-10-CM | POA: Diagnosis not present

## 2022-03-17 DIAGNOSIS — Z885 Allergy status to narcotic agent status: Secondary | ICD-10-CM

## 2022-03-17 DIAGNOSIS — R600 Localized edema: Secondary | ICD-10-CM | POA: Insufficient documentation

## 2022-03-17 DIAGNOSIS — Z8673 Personal history of transient ischemic attack (TIA), and cerebral infarction without residual deficits: Secondary | ICD-10-CM

## 2022-03-17 DIAGNOSIS — Z91041 Radiographic dye allergy status: Secondary | ICD-10-CM

## 2022-03-17 DIAGNOSIS — Z79891 Long term (current) use of opiate analgesic: Secondary | ICD-10-CM

## 2022-03-17 DIAGNOSIS — N179 Acute kidney failure, unspecified: Secondary | ICD-10-CM | POA: Diagnosis present

## 2022-03-17 DIAGNOSIS — Z880 Allergy status to penicillin: Secondary | ICD-10-CM

## 2022-03-17 DIAGNOSIS — E1165 Type 2 diabetes mellitus with hyperglycemia: Secondary | ICD-10-CM | POA: Diagnosis present

## 2022-03-17 DIAGNOSIS — I480 Paroxysmal atrial fibrillation: Secondary | ICD-10-CM | POA: Diagnosis present

## 2022-03-17 DIAGNOSIS — D631 Anemia in chronic kidney disease: Secondary | ICD-10-CM | POA: Diagnosis present

## 2022-03-17 DIAGNOSIS — K219 Gastro-esophageal reflux disease without esophagitis: Secondary | ICD-10-CM | POA: Diagnosis present

## 2022-03-17 DIAGNOSIS — I10 Essential (primary) hypertension: Secondary | ICD-10-CM | POA: Diagnosis present

## 2022-03-17 LAB — CBC WITH DIFFERENTIAL/PLATELET
Abs Immature Granulocytes: 0.1 10*3/uL — ABNORMAL HIGH (ref 0.00–0.07)
Basophils Absolute: 0.1 10*3/uL (ref 0.0–0.1)
Basophils Relative: 1 %
Eosinophils Absolute: 0.1 10*3/uL (ref 0.0–0.5)
Eosinophils Relative: 1 %
HCT: 26.6 % — ABNORMAL LOW (ref 36.0–46.0)
Hemoglobin: 8.5 g/dL — ABNORMAL LOW (ref 12.0–15.0)
Immature Granulocytes: 1 %
Lymphocytes Relative: 14 %
Lymphs Abs: 2 10*3/uL (ref 0.7–4.0)
MCH: 29.8 pg (ref 26.0–34.0)
MCHC: 32 g/dL (ref 30.0–36.0)
MCV: 93.3 fL (ref 80.0–100.0)
Monocytes Absolute: 0.7 10*3/uL (ref 0.1–1.0)
Monocytes Relative: 5 %
Neutro Abs: 11.6 10*3/uL — ABNORMAL HIGH (ref 1.7–7.7)
Neutrophils Relative %: 78 %
Platelets: 464 10*3/uL — ABNORMAL HIGH (ref 150–400)
RBC: 2.85 MIL/uL — ABNORMAL LOW (ref 3.87–5.11)
RDW: 13.2 % (ref 11.5–15.5)
WBC: 14.6 10*3/uL — ABNORMAL HIGH (ref 4.0–10.5)
nRBC: 0 % (ref 0.0–0.2)

## 2022-03-17 LAB — I-STAT VENOUS BLOOD GAS, ED
Acid-base deficit: 1 mmol/L (ref 0.0–2.0)
Bicarbonate: 20.9 mmol/L (ref 20.0–28.0)
Calcium, Ion: 1.01 mmol/L — ABNORMAL LOW (ref 1.15–1.40)
HCT: 26 % — ABNORMAL LOW (ref 36.0–46.0)
Hemoglobin: 8.8 g/dL — ABNORMAL LOW (ref 12.0–15.0)
O2 Saturation: 99 %
Potassium: 5.8 mmol/L — ABNORMAL HIGH (ref 3.5–5.1)
Sodium: 129 mmol/L — ABNORMAL LOW (ref 135–145)
TCO2: 22 mmol/L (ref 22–32)
pCO2, Ven: 25.2 mmHg — ABNORMAL LOW (ref 44–60)
pH, Ven: 7.526 — ABNORMAL HIGH (ref 7.25–7.43)
pO2, Ven: 110 mmHg — ABNORMAL HIGH (ref 32–45)

## 2022-03-17 LAB — URINALYSIS, ROUTINE W REFLEX MICROSCOPIC
Bacteria, UA: NONE SEEN
Bilirubin Urine: NEGATIVE
Glucose, UA: 150 mg/dL — AB
Ketones, ur: NEGATIVE mg/dL
Leukocytes,Ua: NEGATIVE
Nitrite: NEGATIVE
Protein, ur: 100 mg/dL — AB
Specific Gravity, Urine: 1.011 (ref 1.005–1.030)
pH: 5 (ref 5.0–8.0)

## 2022-03-17 LAB — COMPREHENSIVE METABOLIC PANEL
ALT: 13 U/L (ref 0–44)
AST: 22 U/L (ref 15–41)
Albumin: 3.1 g/dL — ABNORMAL LOW (ref 3.5–5.0)
Alkaline Phosphatase: 106 U/L (ref 38–126)
Anion gap: 15 (ref 5–15)
BUN: 53 mg/dL — ABNORMAL HIGH (ref 6–20)
CO2: 19 mmol/L — ABNORMAL LOW (ref 22–32)
Calcium: 9 mg/dL (ref 8.9–10.3)
Chloride: 95 mmol/L — ABNORMAL LOW (ref 98–111)
Creatinine, Ser: 4.27 mg/dL — ABNORMAL HIGH (ref 0.44–1.00)
GFR, Estimated: 12 mL/min — ABNORMAL LOW (ref >60)
Glucose, Bld: 442 mg/dL — ABNORMAL HIGH (ref 70–99)
Potassium: 5.6 mmol/L — ABNORMAL HIGH (ref 3.5–5.1)
Sodium: 129 mmol/L — ABNORMAL LOW (ref 135–145)
Total Bilirubin: 1.3 mg/dL — ABNORMAL HIGH (ref 0.3–1.2)
Total Protein: 7.4 g/dL (ref 6.5–8.1)

## 2022-03-17 LAB — CBG MONITORING, ED
Glucose-Capillary: 176 mg/dL — ABNORMAL HIGH (ref 70–99)
Glucose-Capillary: 334 mg/dL — ABNORMAL HIGH (ref 70–99)
Glucose-Capillary: 399 mg/dL — ABNORMAL HIGH (ref 70–99)

## 2022-03-17 LAB — BETA-HYDROXYBUTYRIC ACID: Beta-Hydroxybutyric Acid: 0.51 mmol/L — ABNORMAL HIGH (ref 0.05–0.27)

## 2022-03-17 LAB — TROPONIN I (HIGH SENSITIVITY)
Troponin I (High Sensitivity): 33 ng/L — ABNORMAL HIGH (ref <18)
Troponin I (High Sensitivity): 29 ng/L — ABNORMAL HIGH (ref <18)

## 2022-03-17 LAB — LIPASE, BLOOD: Lipase: 37 U/L (ref 11–51)

## 2022-03-17 LAB — BRAIN NATRIURETIC PEPTIDE: B Natriuretic Peptide: 43.9 pg/mL (ref 0.0–100.0)

## 2022-03-17 MED ORDER — DEXTROSE 50 % IV SOLN: 0.0000 mL | INTRAVENOUS | Status: DC | PRN

## 2022-03-17 MED ORDER — ACETAMINOPHEN 325 MG PO TABS
650.0000 mg | ORAL_TABLET | Freq: Four times a day (QID) | ORAL | Status: DC | PRN
  Filled 2022-03-17 (×2): qty 2

## 2022-03-17 MED ORDER — INSULIN REGULAR(HUMAN) IN NACL 100-0.9 UT/100ML-% IV SOLN
INTRAVENOUS | Status: DC
  Administered 2022-03-17: 11.5 [IU]/h via INTRAVENOUS
  Filled 2022-03-17: qty 100

## 2022-03-17 MED ORDER — LACTATED RINGERS IV BOLUS
500.0000 mL | Freq: Once | INTRAVENOUS | Status: AC
  Administered 2022-03-17: 500 mL via INTRAVENOUS

## 2022-03-17 MED ORDER — INSULIN GLARGINE-YFGN 100 UNIT/ML ~~LOC~~ SOLN
25.0000 [IU] | Freq: Every day | SUBCUTANEOUS | Status: DC
  Administered 2022-03-18: 25 [IU] via SUBCUTANEOUS
  Filled 2022-03-17 (×2): qty 0.25

## 2022-03-17 MED ORDER — ACETAMINOPHEN 650 MG RE SUPP: 650.0000 mg | Freq: Four times a day (QID) | RECTAL | Status: DC | PRN

## 2022-03-17 MED ORDER — DEXTROSE-NACL 5-0.9 % IV SOLN: INTRAVENOUS | Status: DC

## 2022-03-17 MED ORDER — INSULIN ASPART 100 UNIT/ML IJ SOLN
0.0000 [IU] | Freq: Three times a day (TID) | INTRAMUSCULAR | Status: DC
  Administered 2022-03-18: 11 [IU] via SUBCUTANEOUS
  Administered 2022-03-18: 8 [IU] via SUBCUTANEOUS
  Administered 2022-03-18: 3 [IU] via SUBCUTANEOUS
  Administered 2022-03-19: 5 [IU] via SUBCUTANEOUS
  Administered 2022-03-19: 11 [IU] via SUBCUTANEOUS
  Administered 2022-03-20 (×2): 8 [IU] via SUBCUTANEOUS

## 2022-03-17 MED ORDER — LACTATED RINGERS IV SOLN: INTRAVENOUS | Status: DC

## 2022-03-17 MED ORDER — ENOXAPARIN SODIUM 30 MG/0.3ML IJ SOSY: 30.0000 mg | PREFILLED_SYRINGE | INTRAMUSCULAR | Status: DC

## 2022-03-17 MED ORDER — DEXTROSE IN LACTATED RINGERS 5 % IV SOLN: INTRAVENOUS | Status: DC

## 2022-03-17 MED ORDER — INSULIN ASPART 100 UNIT/ML IJ SOLN
0.0000 [IU] | Freq: Every day | INTRAMUSCULAR | Status: DC
  Administered 2022-03-18: 2 [IU] via SUBCUTANEOUS
  Administered 2022-03-19 – 2022-03-20 (×2): 3 [IU] via SUBCUTANEOUS

## 2022-03-17 MED ORDER — SODIUM ZIRCONIUM CYCLOSILICATE 5 G PO PACK
5.0000 g | PACK | Freq: Once | ORAL | Status: AC
  Administered 2022-03-18: 5 g via ORAL
  Filled 2022-03-17: qty 1

## 2022-03-17 MED ORDER — HYDROMORPHONE HCL 1 MG/ML IJ SOLN
1.0000 mg | Freq: Once | INTRAMUSCULAR | Status: AC
  Administered 2022-03-17: 1 mg via INTRAVENOUS
  Filled 2022-03-17: qty 1

## 2022-03-17 MED ORDER — SODIUM CHLORIDE 0.9 % IV SOLN: INTRAVENOUS | Status: DC

## 2022-03-17 MED ORDER — HYDROMORPHONE HCL 1 MG/ML IJ SOLN
1.0000 mg | Freq: Once | INTRAMUSCULAR | Status: AC
  Administered 2022-03-17: 1 mg via INTRAMUSCULAR
  Filled 2022-03-17: qty 1

## 2022-03-17 NOTE — ED Notes (Signed)
Charge nurse made aware of PA request for a room at this time

## 2022-03-17 NOTE — ED Notes (Signed)
[phone] Shanon Brow pt son would like a update

## 2022-03-17 NOTE — H&P (Incomplete)
Hospital Admission History and Physical Service Pager: [phone]  Patient name: Deborah [name] Medical record number: [id number] Date of Birth: April 08, 1965 Age: 57 y.o. Gender: female  Primary Care Provider: Cena Benton, MD Consultants: none Code Status: FULL Preferred Emergency Contact: mother, Mabell Esguerra Information     Name Relation Home Work [place] Mother [phone]     Alejandra, Hunt [phone]  [phone]        Chief Complaint: Abdominal pain  Assessment and Plan: Deborah [name] is a 57 y.o. female presenting with abm pain and N/V . Differential for this patient's presentation of this includes acute on chronic gastroparesis, infectious gastroenteritis, and can't-miss diagnoses such as acute pancreatitis, acute hepatitis, appendicitis, and biliary etiologies. Acute on chronic gastroparesis most likely given pt's multiple past hospitalizations for similar presentations, hyperglycemia, and lack of fever. Infectious gastro is also considered given pt's N/V, but less likely given lack of recent infectious exposures. Pancreatitis, hepatitis, appendicitis, and biliary etioligies are ruled out by negative CTAP, normal lipase, normal transaminases, and normal Bili. UTI is ruled out by UA not c/f infxn.   * Diabetic gastroparesis ([place]) Pt presents w/ 1 day hx of acute sharp diffuse abm pain and N/V, most likely 2/2 acute exacerbation of chronic diabetic gastroparesis.  - Admit to [facility] Surg w/ attending Dr. Erin Hearing - s/p 1L bolus in ED. Cap refill <2 on exam, will discontinue IVF. Caution with fluids given CHF. Can restart if poor PO.  - Dilaudid 0.'5mg'$  q3h prn for pain for 3 doses - Tx hyperglycemia/T2DM (see below) - Cont home Linzess prn - Cont home Zofran prn - Per chart review, pt has adverse reaction to metoclopramide. Consider other gastro motility options such as erythromycin.  - Vitals per floor - AM CBC,  CMP  Type 2 diabetes mellitus with hyperglycemia, with long-term current use of insulin (HCC) Glucose elevated to 399 on admission. Most recent A1c 7.5 08/2021. Pt reports taking insulin and home meds as prescribed however has not been adherent to her insulin in the past 2-3 days. Hyperglycemia may be 2/2 stress from pain and N/V. Mildly elevated ketones but no acidosis to suggest DKA.  - Endotool started in the ED, will discontinue at this time - Will decrease home regimen given decreased PO & N/V (Home regimen: Lantus 50 qhs, Lispro 30 QID)  - Semglee 25u qhs (for basal)  - modSSI for mealtime, add mealtime coverage if needed - monitor CBG  Respiratory alkalosis VBG on admission c/w respiratory alkalosis w/ metabolic compensation. Given tachypnea, this is most likely due to hyperventilation in setting of uncontrolled pain.  - Monitor resp status. Consider repeat VBG if tachypnea does not resolve.  Acute kidney injury superimposed on chronic kidney disease ([place]) Likely pre-renal in setting of N/V and dehydration.  - s/p 1L bolus in ED - AM CMP  Hyperkalemia K elevated on admission, however likely spurious as sample was hemolyzed. - s/p Lokelma in ED - AM CMP  Leukocytosis WBC noted on admission. Reassuringly, pt is afebrile and has no obvious signs of infection (CXR wnl, UA not c/f UTI). Leukocytosis most likely stress reaction to N/V and pain. - AM CBC - Monitor for fever  Essential hypertension, benign BP elevated on admission, most likely 2/2 pain but also with soft BP.  - Cont home metoprolol - Holding home hydralazine to avoid hypotension - hold spiro, torsemide given AKI. Restart once Cr improves.    Other problems chronic  and stable: Paroxysmal atrial fibrillation-in sinus rhythm, continue apixaban and metoprolol succinate HFpEF- Cont Imdur. Holding home torsemide, spironolactone for AKI GERD-continue pantoprazole twice daily, famotidine Allergic rhinitis-continue  loratadine (formulary), Flonase HLD-continue atorvastatin  FEN/GI: Carb modified diet VTE Prophylaxis: Home Apixaban  Disposition: MedSurg  History of Present Illness:  Deborah [name] is a 57 y.o. female presenting with abdominal pain.  Patient reports she had severe stabbing diffuse abdominal pain that worsened last night. She normally has some chronic abdominal pain and occasional nausea and vomiting due to gastroparesis. She reports associated nausea and vomiting. She has not been able to hold down food or fluids for the past 2 days. She has also had back pain. Last BM was 2 days ago.  Denies urinary symptoms, vaginal discharge.  She reports she has chronic diarrhea that usually occurs right after eating. No suspicious food intake. No recent medication changes. She reports she recently "threw her husband out" the week of Thanksgiving. No recent travel.  In the ED, patient was noted to be tachycardic to 120s to 130s, mildly hypertensive but otherwise VSS.  Labs notable for hyperkalemia with potassium 5.6 (though hemolyzed sample), elevated creatinine 4.27 consistent with AKI, hyperglycemia with glucose in the 400s, mild leukocytosis 14.6, mild anemia hemoglobin 8.5, lipase within normal limits.  Troponin trended flat at 33 and 29.  VBG with slightly alkalotic pH 7.5.  UA with some glucose but no ketones.  CT abdomen/pelvis obtained without any acute findings.  CXR unremarkable.  Patient was given IV Dilaudid 1 mg x 2, Lokelma, LR 500 cc bolus x 2.  She was started on insulin drip.  Review Of Systems: Per HPI with the following additions:  Review of Systems  Constitutional:  Negative for fever.  Respiratory:  Negative for shortness of breath.   Gastrointestinal:  Positive for abdominal pain, nausea and vomiting. Negative for blood in stool.  Genitourinary:  Negative for dysuria.     Pertinent Past Medical History: T2DM Diabetic gastroparesis GERD Gastric ulcer Malignant carcinoid  tumor of the duodenum Paroxysmal A-fib CKD stage IIIb HFpEF (most recent echocardiogram 09/2021 with EF 50 to 55%) HTN Postmenopausal Remainder reviewed in history tab.   Pertinent Past Surgical History: Cholecystectomy Reports she had surgery 2 months ago to remove her carcinoid tumor Endoscopic pyloromyotomy 03/27/2021   Remainder reviewed in history tab.  Pertinent Social History: Tobacco use: Yes/No/Former Alcohol use: denies Other Substance use: denies Lives with self, brother's dog Normally uses a wheelchair due to spinal stenosis. She has a bedside commode. She does minimal walking, usually just to the commode.  Pertinent Family History: Family history of diabetes in multiple family members  Remainder reviewed in history tab.   Important Outpatient Medications:  Remainder reviewed in medication history.   Objective: BP 98/63   Pulse (!) 107   Temp 98.4 F (36.9 C)   Resp 18   LMP 10/10/2012   SpO2 95%  Exam: General: Alert, laying in bed. Obese habitus woman. NAD. Eyes: PERRLA ENTM: NCAT. MMM. Cardiovascular: RRR, no murmurs.  Respiratory: Normal WOB on RA. CTAB, no crackles or wheezing. Good air movement throughout.  Gastrointestinal: Diffusely tender to palpation, especially along R side and lower abm. Soft, nondistended. Normal BS. MSK: Tender to palpation around sternum, L side of chest.  Neuro: Alert and grossly oriented Psych: Mood and affect congruent.  Labs:  CBC BMET  Recent Labs  Lab 03/17/22 1654 03/17/22 1842  WBC 14.6*  --   HGB 8.5* 8.8*  HCT  26.6* 26.0*  PLT 464*  --    Recent Labs  Lab 03/17/22 1654 03/17/22 1842  NA 129* 129*  K 5.6* 5.8*  CL 95*  --   CO2 19*  --   BUN 53*  --   CREATININE 4.27*  --   GLUCOSE 442*  --   CALCIUM 9.0  --     Pertinent additional labs  VBG pH 7.526 Troponin 33, 29 BNP 43.9 UA glucose 150, negative ketones BHB 0.51 EKG: My own interpretation (not copied from electronic read) sinus  tachycardia, no obvious ST changes  Imaging Studies Performed:  CXR Impression from Radiologist: No acute cardiopulmonary disease.    My Interpretation: Agree with radiologist, no obvious infiltrates or pneumothorax   Arlyce Dice, MD 03/18/2022, 1:40 AM PGY-1, [place] Intern pager: [phone], text pages welcome Secure chat group [facility] Teaching Service   Upper Level Addendum:  I have reviewed the above note, making necessary revisions as appropriate.  I agree with the medical decision making and physical exam by the resident as noted above.  Zola Button, MD PGY-3 [facility] Family Medicine Residency

## 2022-03-17 NOTE — ED Triage Notes (Signed)
Patient complains of abd pain N/V for the past 2 days. Patient reports CP today starting about an hour ago starting in the center of chest radiating to the left arm. Patient has not been able to take any of her home medications due to N/V. Per E[facility] patient refused care en route and had some spells of escalated behavior towards EMS staff. Per EMS HR 130 BP 160/130, spo2 100%.

## 2022-03-17 NOTE — ED Notes (Signed)
Ransom PA notified of the patient's resting heart rate at this time

## 2022-03-17 NOTE — ED Notes (Signed)
This rn spoke with reya from the lab who reports they will send back the dark green top for Istat and also start running other labs

## 2022-03-17 NOTE — ED Notes (Signed)
Patient reminded of the need for a urine sample at this time 

## 2022-03-17 NOTE — H&P (Incomplete)
Hospital Admission History and Physical Service Pager: [phone]  Patient name: Deborah [name] Medical record number: [id number] Date of Birth: December 16, 1965 Age: 57 y.o. Gender: female  Primary Care Provider: Cena Benton, MD Consultants: none Code Status: FULL Preferred Emergency Contact: mother, Deborah [name] Information     Name Relation Home Work [place] Mother [phone]     Deborah, [name] [phone]  [phone]        Chief Complaint: Abdominal pain  Assessment and Plan: Deborah [name] is a 57 y.o. female presenting with *** . Differential for this patient's presentation of this includes ***.  (***describe here why less likely than primary diagnosis-delete this once complete***)  No notes have been filed under this hospital service. Service: Family Medicine      FEN/GI: Carb modified diet VTE Prophylaxis: Apixaban  Disposition: MedSurg  History of Present Illness:  Deborah [name] is a 57 y.o. female presenting with abdominal pain.  Patient reports she had severe stabbing diffuse abdominal pain that worsened last night. She normally has some chronic abdominal pain and occasional nausea and vomiting due to gastroparesis. She reports associated nausea and vomiting. She has not been able to hold down food or fluids for the past 2 days. She has also had back pain. Last BM was 2 days ago. She reports she has chronic diarrhea that usually occurs right after eating. No suspicious food intake. No recent medication changes. She reports she recently "threw her husband out" the week of Thanksgiving. No recent travel.  In the ED, patient was noted to be tachycardic to 120s to 130s, mildly hypertensive but otherwise VSS.  Labs notable for hyperkalemia with potassium 5.6 (though hemolyzed sample), elevated creatinine 4.27 consistent with AKI, hyperglycemia with glucose in the 400s, mild leukocytosis 14.6, mild anemia  hemoglobin 8.5, lipase within normal limits.  Troponin trended flat at 33 and 29.  VBG with slightly alkalotic pH 7.5.  UA with some glucose but no ketones.  CT abdomen/pelvis obtained without any acute findings.  CXR unremarkable.  Patient was given IV Dilaudid 1 mg x 2, Lokelma, LR 500 cc bolus x 2.  She was started on insulin drip.  Review Of Systems: Per HPI with the following additions:  Review of Systems  Constitutional:  Negative for fever.  Respiratory:  Negative for shortness of breath.   Gastrointestinal:  Positive for abdominal pain, nausea and vomiting. Negative for blood in stool.  Genitourinary:  Negative for dysuria.     Pertinent Past Medical History: T2DM Diabetic gastroparesis GERD Gastric ulcer Malignant carcinoid tumor of the duodenum Paroxysmal A-fib CKD stage IIIb HFpEF (most recent echocardiogram 09/2021 with EF 50 to 55%) HTN Postmenopausal Remainder reviewed in history tab.   Pertinent Past Surgical History: Cholecystectomy Reports she had surgery 2 months ago to remove her carcinoid tumor   Remainder reviewed in history tab.  Pertinent Social History: Tobacco use: Yes/No/Former Alcohol use: denies Other Substance use: denies Lives with self, brother's dog Normally uses a wheelchair due to spinal stenosis. She has a bedside commode. She does minimal walking, usually just to the commode.  Pertinent Family History: Family history of diabetes in multiple family members  Remainder reviewed in history tab.   Important Outpatient Medications:  Remainder reviewed in medication history.   Objective: BP 131/74   Pulse (!) 117   Temp 98.5 F (36.9 C)   Resp (!) 22   LMP 10/10/2012   SpO2 96%  Exam: General: ***  Eyes: *** ENTM: *** Neck: *** Cardiovascular: *** Respiratory: *** Gastrointestinal: *** MSK: *** Derm: *** Neuro: *** Psych: ***  Labs:  CBC BMET  Recent Labs  Lab 03/17/22 1654 03/17/22 1842  WBC 14.6*  --   HGB 8.5* 8.8*   HCT 26.6* 26.0*  PLT 464*  --    Recent Labs  Lab 03/17/22 1654 03/17/22 1842  NA 129* 129*  K 5.6* 5.8*  CL 95*  --   CO2 19*  --   BUN 53*  --   CREATININE 4.27*  --   GLUCOSE 442*  --   CALCIUM 9.0  --     Pertinent additional labs  VBG pH 7.526 Troponin 33, 29 BNP 43.9 UA glucose 150, negative ketones BHB 0.51 EKG: My own interpretation (not copied from electronic read) ***    Imaging Studies Performed:  Imaging Study (ie. Chest x-ray) CXR Impression from Radiologist: No acute cardiopulmonary disease.    My Interpretation: Agree with radiologist, no obvious infiltrates or pneumothorax   Deborah Button, MD 03/17/2022, 10:37 PM PGY-***, [place] Intern pager: [phone], text pages welcome Secure chat group [place]

## 2022-03-17 NOTE — Hospital Course (Addendum)
Deborah [name] is a 57 y.o.female with a history of T2DM, diabetic gastroparesis, malignant carcinoid tumor of the duodenum, PAF, CKD, HFpEF (EF 50-55%), HTN who was admitted to the [facility] Medicine Teaching Service at [facility] for abdominal pain and AKI. Her hospital course is detailed below:  Acute Exacerbation of Chronic Abdominal pain  Diabetic Gastroparesis Pt admitted w/ acute worsening of chronic abdominal pain. CTAP showed no acute process. Thought to be 2/2 acute on chronic diabetic gastroparesis. Takes Dilaudid chronically for chronic pain, follows w/ PMR for this. Received IV Dilaudid for breakthrough pain with improvement. Prior to discharge, pt was started back on her home PO Dilaudid regimen.   T2DM Presented with significant hyperglycemia to 399. With recent hx of N/V, abd pain, and mildly elevated BHB, she was initially started on Endotool for presumed DKA. However, she was not acidotic and did not have an anion gap so this was dc'd. Her blood glucose improved and she was started on basal insulin with SSI. Upon discharge, her regimen was Lantus 35u nightly, Lispro 7u w/ meals. A1c was elevated to 10.1, will need outpt management.  Acute on Chronic Kidney Disease P/w significant elevation in serum Cr to 4.27. Thought to be prerenal given recent hx of N/V as well as recent increase in her home torsemide dose. Her torsemide and spironolactone were held and she was started on fluids (gentle due to CHF). Nephrology was consulted due to concern for progression of her CKD. They recommended continued IVF and discontinuing spironolactone. Renal U/S unremarkable. Dialysis was not indicated. Upon discharge, patient's Cr improved after multiple days of IVF. Torsemide was restarted at discharge.   HFpEF Echo 7/23 showed EF 50-55%. Home torsemide recently increased from '40mg'$  qd to '50mg'$  BID. This was held on admission. She remained euvolemic during her stay. Upon discharge, she was restarted on home  torsemide.  Chronic Anemia  Hgb baseline ~8. Likely component of CKD and anemia of chronic disease. Pt received IV iron and nephrology recommended potential outpatient ESA.   Other chronic conditions were medically managed with home medications and formulary alternatives as necessary   PAF - continued home metoprolol, Eliquis GERD - continued home pepcid, protonix   PCP Follow-up Recommendations: Needs close follow-up for diabetes management. Currently poorly controlled (A1c 10.1) and likely contributing to gastroparesis.  Pt reports taking insulin differently than prescribed.  She reports taking Lantus 50u daily, Lispro 30u four times daily at home.  Spironolactone was discontinued due to decreased kidney function.  Per nephrology, would benefit from outpatient ESA.  Consider weaning Dilaudid due to gastroparesis concerns Consider adding mirtazipine instead of duloxetine for potential additional gastroparesis benefits.

## 2022-03-17 NOTE — ED Provider Notes (Signed)
[facility] Provider Note   [name]: [id number] Arrival date & time: 03/17/22  1637     History {Add pertinent medical, surgical, social history, OB history to HPI:1} Chief Complaint  Patient presents with   Abdominal Pain    Deborah [name] is a 57 y.o. female.   Abdominal Pain      Home Medications Prior to Admission medications   Medication Sig Start Date End Date Taking? Authorizing Provider  albuterol (PROVENTIL) (2.5 MG/3ML) 0.083% nebulizer solution Take 3 mLs (2.5 mg total) by nebulization every 6 (six) hours as needed for wheezing or shortness of breath. 04/06/19   Scot Jun, FNP  apixaban (ELIQUIS) 5 MG TABS tablet Take by mouth. 12/03/21   [provider]  atorvastatin (LIPITOR) 10 MG tablet Take 1 tablet by mouth daily. 12/03/21   [provider]  cetirizine (ZYRTEC) 10 MG tablet TAKE 1 TABLET (10 MG TOTAL) BY MOUTH DAILY (AM) Patient taking differently: Take 10 mg by mouth daily. 06/30/21   Raulkar, Clide Deutscher, MD  cyclobenzaprine (FLEXERIL) 10 MG tablet Take 1 tablet (10 mg total) by mouth 3 (three) times daily as needed for muscle spasms. 12/04/21   Bayard Hugger, NP  DULoxetine HCl 40 MG CPEP TAKE 40 MG BY MOUTH IN THE MORNING AND AT BEDTIME. 06/30/21   Raulkar, Clide Deutscher, MD  EASY COMFORT PEN NEEDLES 31G X 5 MM MISC USE 3 TIMES A DAY FOR INSULIN ADMINISTRATION 11/14/19   Meccariello, Bernita Raisin, MD  famotidine (PEPCID) 40 MG tablet Take 40 mg by mouth daily. 04/03/21 04/03/22  [provider]  fluticasone (FLONASE) 50 MCG/ACT nasal spray Place 2 sprays into both nostrils daily as needed for allergies or rhinitis. Patient taking differently: Place 1 spray into both nostrils daily. 12/19/18   Rai, Vernelle Emerald, MD  folic acid (FOLVITE) 1 MG tablet Take 1 mg by mouth daily. 04/07/21   [provider]  hydrALAZINE (APRESOLINE) 50 MG tablet Take 1 tablet by mouth 3 (three) times daily. 12/03/21    [provider]  HYDROmorphone (DILAUDID) 4 MG tablet Take 1 tablet (4 mg total) by mouth 2 (two) times daily. May take an extra tablet when pain is severe 20 days out of the month . 02/23/22   Bayard Hugger, NP  insulin lispro (HUMALOG) 100 UNIT/ML KwikPen Inject 7 Units into the skin 3 (three) times daily with meals. 08/28/21   Gaylan Gerold, DO  isosorbide mononitrate (IMDUR) 60 MG 24 hr tablet Take 1 tablet by mouth daily. 12/03/21   [provider]  LANTUS SOLOSTAR 100 UNIT/ML Solostar Pen Inject 35 Units into the skin at bedtime. 08/28/21   Gaylan Gerold, DO  linaclotide [facility]) 145 MCG CAPS capsule Take 1 capsule (145 mcg total) by mouth daily before breakfast. 09/15/21 10/15/21  Romana Juniper, MD  magnesium chloride (SLOW-MAG) 64 MG TBEC SR tablet Take 1 tablet by mouth daily. 12/07/21   [provider]  meclizine (ANTIVERT) 25 MG tablet Take 1 tablet (25 mg total) by mouth 3 (three) times daily as needed for dizziness. 02/20/21   Raulkar, Clide Deutscher, MD  methocarbamol (ROBAXIN) 750 MG tablet Take 1 tablet (750 mg total) by mouth every 8 (eight) hours. 08/28/21   Gaylan Gerold, DO  metoCLOPramide (REGLAN) 5 MG tablet TAKE 1 TABLET (5 MG TOTAL) BY MOUTH 4 TIMES DAILY. 12/03/21   [provider]  metoprolol succinate (TOPROL-XL) 50 MG 24 hr tablet Take 1 tablet (50 mg  total) by mouth daily. Take with or immediately following a meal. 11/24/20 09/11/21  Enzo Bi, MD  ondansetron (ZOFRAN ODT) 4 MG disintegrating tablet Take 1 tablet (4 mg total) by mouth every 8 (eight) hours as needed for nausea or vomiting. 05/30/21   Sherwood Gambler, MD  pantoprazole (PROTONIX) 40 MG tablet TAKE 1 TABLET BY MOUTH 2 (TWO) TIMES DAILY. (AM+BEDTIME) 03/11/22   Raulkar, Clide Deutscher, MD  polyethylene glycol powder (GLYCOLAX/MIRALAX) 17 GM/SCOOP powder Take 17 g by mouth daily. 09/16/21   Romana Juniper, MD  promethazine (PHENERGAN) 25 MG tablet Take 1 tablet (25 mg total) by mouth  every 6 (six) hours as needed for nausea or vomiting. 05/30/21   Sherwood Gambler, MD  SENNA PLUS 8.6-50 MG tablet Take 1 tablet by mouth 2 (two) times daily. 05/19/21   [provider]  simethicone (MYLICON) 80 MG chewable tablet Chew 1 tablet (80 mg total) by mouth every 6 (six) hours as needed for up to 14 days for flatulence. 08/28/21 09/11/21  Gaylan Gerold, DO  sodium bicarbonate 650 MG tablet Take 1 tablet (650 mg total) by mouth 2 (two) times daily. 04/20/21   Jennye Boroughs, MD  spironolactone (ALDACTONE) 25 MG tablet Take 0.5 tablets by mouth daily. 12/03/21   [provider]  torsemide (DEMADEX) 20 MG tablet Take 20 mg by mouth daily as needed (fluid retention). 09/04/21   [provider]  torsemide (DEMADEX) 20 MG tablet Take 2 tablets by mouth daily. 12/03/21   [provider]  Vitamin D, Ergocalciferol, (DRISDOL) 1.25 MG (50000 UNIT) CAPS capsule Take 50,000 Units by mouth every Monday. 04/07/21   [provider]  NOVOLOG FLEXPEN 100 UNIT/ML FlexPen Inject 7 Units into the skin 3 (three) times daily with meals. 08/28/21 08/28/21  Gaylan Gerold, DO      Allergies    Diazepam, Gabapentin, Iodinated contrast media, Isovue [iopamidol], Lisinopril, Metoclopramide, Nsaids, Penicillins, Dicyclomine hcl, Tolmetin, Cyclobenzaprine, Rifamycins, Acetaminophen, Oxycodone, and Tramadol    Review of Systems   Review of Systems  Gastrointestinal:  Positive for abdominal pain.    Physical Exam Updated Vital Signs BP (!) 118/95   Pulse (!) 124   Temp 98.5 F (36.9 C)   Resp 18   LMP 10/10/2012   SpO2 99%  Physical Exam  ED Results / Procedures / Treatments   Labs (all labs ordered are listed, but only abnormal results are displayed) Labs Reviewed  CBC WITH DIFFERENTIAL/PLATELET - Abnormal; Notable for the following components:      Result Value   WBC 14.6 (*)    RBC 2.85 (*)    Hemoglobin 8.5 (*)    HCT 26.6 (*)    Platelets 464 (*)    Neutro Abs  11.6 (*)    Abs Immature Granulocytes 0.10 (*)    All other components within normal limits  COMPREHENSIVE METABOLIC PANEL - Abnormal; Notable for the following components:   Sodium 129 (*)    Potassium 5.6 (*)    Chloride 95 (*)    CO2 19 (*)    Glucose, Bld 442 (*)    BUN 53 (*)    Creatinine, Ser 4.27 (*)    Albumin 3.1 (*)    Total Bilirubin 1.3 (*)    GFR, Estimated 12 (*)    All other components within normal limits  URINALYSIS, ROUTINE W REFLEX MICROSCOPIC - Abnormal; Notable for the following components:   Glucose, UA 150 (*)    Hgb urine dipstick SMALL (*)  Protein, ur 100 (*)    All other components within normal limits  CBG MONITORING, ED - Abnormal; Notable for the following components:   Glucose-Capillary 399 (*)    All other components within normal limits  I-STAT VENOUS BLOOD GAS, ED - Abnormal; Notable for the following components:   pH, Ven 7.526 (*)    pCO2, Ven 25.2 (*)    pO2, Ven 110 (*)    Sodium 129 (*)    Potassium 5.8 (*)    Calcium, Ion 1.01 (*)    HCT 26.0 (*)    Hemoglobin 8.8 (*)    All other components within normal limits  TROPONIN I (HIGH SENSITIVITY) - Abnormal; Notable for the following components:   Troponin I (High Sensitivity) 33 (*)    All other components within normal limits  LIPASE, BLOOD  BRAIN NATRIURETIC PEPTIDE  BETA-HYDROXYBUTYRIC ACID  TROPONIN I (HIGH SENSITIVITY)    EKG EKG Interpretation  Date/Time:  Wednesday March 17 2022 17:18:49 EST Ventricular Rate:  122 PR Interval:  154 QRS Duration: 82 QT Interval:  322 QTC Calculation: 458 R Axis:   -53 Text Interpretation: Sinus tachycardia Left anterior fascicular block Septal infarct , age undetermined Abnormal ECG When compared with ECG of 10-Sep-2021 22:04, No significant change since last tracing Confirmed by Gareth Morgan [phone]) on 03/17/2022 5:42:20 PM  Radiology CT ABDOMEN PELVIS WO CONTRAST  Result Date: 03/17/2022 CLINICAL DATA:  Abdominal pain,  acute, nonlocalized, nausea, vomiting. EXAM: CT ABDOMEN AND PELVIS WITHOUT CONTRAST TECHNIQUE: Multidetector CT imaging of the abdomen and pelvis was performed following the standard protocol without IV contrast. RADIATION DOSE REDUCTION: This exam was performed according to the departmental dose-optimization program which includes automated exposure control, adjustment of the mA and/or kV according to patient size and/or use of iterative reconstruction technique. COMPARISON:  09/10/2021 FINDINGS: Lower chest: No acute abnormality. Hepatobiliary: Stable mild hepatomegaly. No focal intrahepatic mass on this noncontrast examination. No intra or extrahepatic biliary ductal dilation. Status post cholecystectomy. Pancreas: Unremarkable Spleen: Unremarkable Adrenals/Urinary Tract: Adrenal glands are unremarkable. Kidneys are normal, without renal calculi, focal lesion, or hydronephrosis. Bladder is unremarkable. Stomach/Bowel: Stomach is within normal limits. Appendix appears normal. No evidence of bowel wall thickening, distention, or inflammatory changes. Vascular/Lymphatic: Minimal atherosclerotic calcification within the abdominal aorta. No aortic aneurysm. No pathologic adenopathy within the abdomen and pelvis. Reproductive: 4.8 cm hyperdense mass abuts the uterine fundus extending into the left adnexa and is previously characterized on MRI examination of 11/29/2017 as an exophytic uterine fibroid. This is stable. The pelvic organs are otherwise unremarkable. Other: No abdominal wall hernia or abnormality. No abdominopelvic ascites. Musculoskeletal: Degenerative changes are seen within the lumbar spine. No lytic or blastic bone lesion. No acute bone abnormality. IMPRESSION: 1. No acute intra-abdominal pathology identified. No definite radiographic explanation for the patient's reported symptoms. 2. Stable mild hepatomegaly. 3. Stable 4.8 cm exophytic uterine fibroid. Aortic Atherosclerosis (ICD10-I70.0).  Electronically Signed   By: Fidela Salisbury M.D.   On: 03/17/2022 19:50   DG Chest 2 View  Result Date: 03/17/2022 CLINICAL DATA:  Chest pain EXAM: CHEST - 2 VIEW COMPARISON:  09/10/2021 and older. FINDINGS: Under penetrated radiographs. Underinflation. No consolidation, pneumothorax or effusion. Normal cardiopericardial silhouette without edema. Frontal views are also somewhat lordotic. Degenerative changes of the spine. IMPRESSION: No acute cardiopulmonary disease. Electronically Signed   By: Jill Side M.D.   On: 03/17/2022 17:22    Procedures Procedures  {Document cardiac monitor, telemetry assessment procedure when appropriate:1}  Medications Ordered  in ED Medications  sodium zirconium cyclosilicate (LOKELMA) packet 5 g (has no administration in time range)  HYDROmorphone (DILAUDID) injection 1 mg (1 mg Intravenous Given 03/17/22 1801)  lactated ringers bolus 500 mL (0 mLs Intravenous Stopped 03/17/22 1908)  lactated ringers bolus 500 mL (500 mLs Intravenous New Bag/Given 03/17/22 2137)  HYDROmorphone (DILAUDID) injection 1 mg (1 mg Intramuscular Given 03/17/22 2008)    ED Course/ Medical Decision Making/ A&P                           Medical Decision Making Amount and/or Complexity of Data Reviewed Labs: ordered. Radiology: ordered.  Risk Prescription drug management.   ***  {Document critical care time when appropriate:1} {Document review of labs and clinical decision tools ie heart score, Chads2Vasc2 etc:1}  {Document your independent review of radiology images, and any outside records:1} {Document your discussion with family members, caretakers, and with consultants:1} {Document social determinants of health affecting pt's care:1} {Document your decision making why or why not admission, treatments were needed:1} Final Clinical Impression(s) / ED Diagnoses Final diagnoses:  None    Rx / DC Orders ED Discharge Orders     None

## 2022-03-18 DIAGNOSIS — E874 Mixed disorder of acid-base balance: Secondary | ICD-10-CM | POA: Diagnosis present

## 2022-03-18 DIAGNOSIS — M797 Fibromyalgia: Secondary | ICD-10-CM | POA: Diagnosis present

## 2022-03-18 DIAGNOSIS — N179 Acute kidney failure, unspecified: Secondary | ICD-10-CM | POA: Diagnosis not present

## 2022-03-18 DIAGNOSIS — Z880 Allergy status to penicillin: Secondary | ICD-10-CM | POA: Diagnosis not present

## 2022-03-18 DIAGNOSIS — E860 Dehydration: Secondary | ICD-10-CM | POA: Diagnosis present

## 2022-03-18 DIAGNOSIS — I480 Paroxysmal atrial fibrillation: Secondary | ICD-10-CM | POA: Diagnosis present

## 2022-03-18 DIAGNOSIS — R000 Tachycardia, unspecified: Secondary | ICD-10-CM

## 2022-03-18 DIAGNOSIS — Z794 Long term (current) use of insulin: Secondary | ICD-10-CM | POA: Diagnosis not present

## 2022-03-18 DIAGNOSIS — E1122 Type 2 diabetes mellitus with diabetic chronic kidney disease: Secondary | ICD-10-CM | POA: Diagnosis present

## 2022-03-18 DIAGNOSIS — G8929 Other chronic pain: Secondary | ICD-10-CM | POA: Diagnosis present

## 2022-03-18 DIAGNOSIS — N170 Acute kidney failure with tubular necrosis: Secondary | ICD-10-CM | POA: Diagnosis present

## 2022-03-18 DIAGNOSIS — K219 Gastro-esophageal reflux disease without esophagitis: Secondary | ICD-10-CM | POA: Diagnosis present

## 2022-03-18 DIAGNOSIS — Z6841 Body Mass Index (BMI) 40.0 and over, adult: Secondary | ICD-10-CM | POA: Diagnosis not present

## 2022-03-18 DIAGNOSIS — E1165 Type 2 diabetes mellitus with hyperglycemia: Secondary | ICD-10-CM | POA: Diagnosis present

## 2022-03-18 DIAGNOSIS — D631 Anemia in chronic kidney disease: Secondary | ICD-10-CM | POA: Diagnosis present

## 2022-03-18 DIAGNOSIS — E785 Hyperlipidemia, unspecified: Secondary | ICD-10-CM | POA: Diagnosis present

## 2022-03-18 DIAGNOSIS — E1143 Type 2 diabetes mellitus with diabetic autonomic (poly)neuropathy: Secondary | ICD-10-CM

## 2022-03-18 DIAGNOSIS — Z885 Allergy status to narcotic agent status: Secondary | ICD-10-CM | POA: Diagnosis not present

## 2022-03-18 DIAGNOSIS — Z79891 Long term (current) use of opiate analgesic: Secondary | ICD-10-CM | POA: Diagnosis not present

## 2022-03-18 DIAGNOSIS — K3184 Gastroparesis: Secondary | ICD-10-CM | POA: Diagnosis not present

## 2022-03-18 DIAGNOSIS — I5042 Chronic combined systolic (congestive) and diastolic (congestive) heart failure: Secondary | ICD-10-CM | POA: Diagnosis present

## 2022-03-18 DIAGNOSIS — E875 Hyperkalemia: Secondary | ICD-10-CM | POA: Diagnosis present

## 2022-03-18 DIAGNOSIS — N184 Chronic kidney disease, stage 4 (severe): Secondary | ICD-10-CM | POA: Diagnosis present

## 2022-03-18 DIAGNOSIS — I130 Hypertensive heart and chronic kidney disease with heart failure and stage 1 through stage 4 chronic kidney disease, or unspecified chronic kidney disease: Secondary | ICD-10-CM | POA: Diagnosis present

## 2022-03-18 DIAGNOSIS — N189 Chronic kidney disease, unspecified: Secondary | ICD-10-CM | POA: Diagnosis not present

## 2022-03-18 DIAGNOSIS — E871 Hypo-osmolality and hyponatremia: Secondary | ICD-10-CM | POA: Diagnosis not present

## 2022-03-18 LAB — GLUCOSE, CAPILLARY
Glucose-Capillary: 308 mg/dL — ABNORMAL HIGH (ref 70–99)
Glucose-Capillary: 279 mg/dL — ABNORMAL HIGH (ref 70–99)
Glucose-Capillary: 202 mg/dL — ABNORMAL HIGH (ref 70–99)

## 2022-03-18 LAB — COMPREHENSIVE METABOLIC PANEL
ALT: 10 U/L (ref 0–44)
AST: 15 U/L (ref 15–41)
Albumin: 3.3 g/dL — ABNORMAL LOW (ref 3.5–5.0)
Alkaline Phosphatase: 99 U/L (ref 38–126)
Anion gap: 14 (ref 5–15)
BUN: 52 mg/dL — ABNORMAL HIGH (ref 6–20)
CO2: 21 mmol/L — ABNORMAL LOW (ref 22–32)
Calcium: 9.2 mg/dL (ref 8.9–10.3)
Chloride: 101 mmol/L (ref 98–111)
Creatinine, Ser: 4.41 mg/dL — ABNORMAL HIGH (ref 0.44–1.00)
GFR, Estimated: 11 mL/min — ABNORMAL LOW (ref >60)
Glucose, Bld: 163 mg/dL — ABNORMAL HIGH (ref 70–99)
Potassium: 3.9 mmol/L (ref 3.5–5.1)
Sodium: 136 mmol/L (ref 135–145)
Total Bilirubin: 0.6 mg/dL (ref 0.3–1.2)
Total Protein: 7.8 g/dL (ref 6.5–8.1)

## 2022-03-18 LAB — CBC
HCT: 27.1 % — ABNORMAL LOW (ref 36.0–46.0)
Hemoglobin: 8.4 g/dL — ABNORMAL LOW (ref 12.0–15.0)
MCH: 28.9 pg (ref 26.0–34.0)
MCHC: 31 g/dL (ref 30.0–36.0)
MCV: 93.1 fL (ref 80.0–100.0)
Platelets: 445 10*3/uL — ABNORMAL HIGH (ref 150–400)
RBC: 2.91 MIL/uL — ABNORMAL LOW (ref 3.87–5.11)
RDW: 13.1 % (ref 11.5–15.5)
WBC: 14.3 10*3/uL — ABNORMAL HIGH (ref 4.0–10.5)
nRBC: 0 % (ref 0.0–0.2)

## 2022-03-18 MED ORDER — SODIUM BICARBONATE 650 MG PO TABS
650.0000 mg | ORAL_TABLET | Freq: Two times a day (BID) | ORAL | Status: DC
  Administered 2022-03-18 – 2022-03-21 (×7): 650 mg via ORAL
  Filled 2022-03-18 (×9): qty 1

## 2022-03-18 MED ORDER — LACTATED RINGERS IV BOLUS
500.0000 mL | Freq: Once | INTRAVENOUS | Status: AC
  Administered 2022-03-18: 500 mL via INTRAVENOUS

## 2022-03-18 MED ORDER — FLUTICASONE PROPIONATE 50 MCG/ACT NA SUSP
1.0000 | Freq: Every day | NASAL | Status: DC
  Administered 2022-03-19 – 2022-03-22 (×2): 1 via NASAL
  Filled 2022-03-18: qty 16

## 2022-03-18 MED ORDER — HYDROMORPHONE HCL 1 MG/ML IJ SOLN
0.5000 mg | Freq: Four times a day (QID) | INTRAMUSCULAR | Status: DC | PRN
  Administered 2022-03-18 – 2022-03-20 (×6): 0.5 mg via INTRAVENOUS
  Filled 2022-03-18 (×6): qty 0.5

## 2022-03-18 MED ORDER — ONDANSETRON 4 MG PO TBDP
4.0000 mg | ORAL_TABLET | Freq: Three times a day (TID) | ORAL | Status: DC | PRN
  Administered 2022-03-18: 4 mg via ORAL
  Filled 2022-03-18 (×2): qty 1

## 2022-03-18 MED ORDER — INSULIN GLARGINE-YFGN 100 UNIT/ML ~~LOC~~ SOLN: 25.0000 [IU] | Freq: Every day | SUBCUTANEOUS | Status: DC

## 2022-03-18 MED ORDER — HYDROMORPHONE HCL 1 MG/ML IJ SOLN
0.5000 mg | INTRAMUSCULAR | Status: AC | PRN
  Administered 2022-03-18 (×2): 0.5 mg via INTRAVENOUS
  Filled 2022-03-18: qty 0.5

## 2022-03-18 MED ORDER — HYDROMORPHONE HCL 1 MG/ML IJ SOLN: 0.5000 mg | INTRAMUSCULAR | Status: DC | PRN

## 2022-03-18 MED ORDER — FAMOTIDINE 20 MG PO TABS
20.0000 mg | ORAL_TABLET | Freq: Every day | ORAL | Status: DC
  Administered 2022-03-18 – 2022-03-22 (×5): 20 mg via ORAL
  Filled 2022-03-18 (×5): qty 1

## 2022-03-18 MED ORDER — METOPROLOL SUCCINATE ER 50 MG PO TB24
50.0000 mg | ORAL_TABLET | Freq: Every day | ORAL | Status: DC
  Administered 2022-03-18 – 2022-03-22 (×5): 50 mg via ORAL
  Filled 2022-03-18 (×5): qty 1

## 2022-03-18 MED ORDER — HYDROMORPHONE HCL 2 MG PO TABS
4.0000 mg | ORAL_TABLET | Freq: Two times a day (BID) | ORAL | Status: DC
  Administered 2022-03-18 – 2022-03-22 (×8): 4 mg via ORAL
  Filled 2022-03-18 (×8): qty 2

## 2022-03-18 MED ORDER — PANTOPRAZOLE SODIUM 40 MG PO TBEC
40.0000 mg | DELAYED_RELEASE_TABLET | Freq: Two times a day (BID) | ORAL | Status: DC
  Administered 2022-03-18 – 2022-03-22 (×8): 40 mg via ORAL
  Filled 2022-03-18 (×9): qty 1

## 2022-03-18 MED ORDER — ISOSORBIDE MONONITRATE ER 60 MG PO TB24
60.0000 mg | ORAL_TABLET | Freq: Every day | ORAL | Status: DC
  Administered 2022-03-18 – 2022-03-22 (×5): 60 mg via ORAL
  Filled 2022-03-18 (×5): qty 1

## 2022-03-18 MED ORDER — SPIRONOLACTONE 12.5 MG HALF TABLET: 12.5000 mg | ORAL_TABLET | Freq: Every day | ORAL | Status: DC

## 2022-03-18 MED ORDER — ATORVASTATIN CALCIUM 10 MG PO TABS
10.0000 mg | ORAL_TABLET | Freq: Every day | ORAL | Status: DC
  Administered 2022-03-18 – 2022-03-22 (×5): 10 mg via ORAL
  Filled 2022-03-18 (×5): qty 1

## 2022-03-18 MED ORDER — LINACLOTIDE 145 MCG PO CAPS: 145.0000 ug | ORAL_CAPSULE | Freq: Every day | ORAL | Status: DC | PRN

## 2022-03-18 MED ORDER — ORAL CARE MOUTH RINSE: 15.0000 mL | OROMUCOSAL | Status: DC | PRN

## 2022-03-18 MED ORDER — APIXABAN 5 MG PO TABS
5.0000 mg | ORAL_TABLET | Freq: Two times a day (BID) | ORAL | Status: DC
  Administered 2022-03-18 – 2022-03-22 (×8): 5 mg via ORAL
  Filled 2022-03-18 (×9): qty 1

## 2022-03-18 MED ORDER — DULOXETINE HCL 20 MG PO CPEP
40.0000 mg | ORAL_CAPSULE | Freq: Two times a day (BID) | ORAL | Status: DC
  Administered 2022-03-18 – 2022-03-19 (×3): 40 mg via ORAL
  Filled 2022-03-18 (×3): qty 2

## 2022-03-18 MED ORDER — TORSEMIDE 20 MG PO TABS: 40.0000 mg | ORAL_TABLET | Freq: Every day | ORAL | Status: DC

## 2022-03-18 MED ORDER — FOLIC ACID 1 MG PO TABS
1.0000 mg | ORAL_TABLET | Freq: Every day | ORAL | Status: DC
  Administered 2022-03-18 – 2022-03-22 (×5): 1 mg via ORAL
  Filled 2022-03-18 (×5): qty 1

## 2022-03-18 MED ORDER — HYDROMORPHONE HCL 1 MG/ML IJ SOLN
0.5000 mg | INTRAMUSCULAR | Status: DC | PRN
  Administered 2022-03-18: 0.5 mg via INTRAMUSCULAR
  Filled 2022-03-18: qty 1
  Filled 2022-03-18: qty 0.5

## 2022-03-18 MED ORDER — INSULIN GLARGINE-YFGN 100 UNIT/ML ~~LOC~~ SOLN
40.0000 [IU] | Freq: Every day | SUBCUTANEOUS | Status: DC
  Administered 2022-03-18: 40 [IU] via SUBCUTANEOUS
  Filled 2022-03-18 (×2): qty 0.4

## 2022-03-18 MED ORDER — SODIUM CHLORIDE 0.9 % IV SOLN: INTRAVENOUS | Status: DC

## 2022-03-18 MED ORDER — HYDROMORPHONE HCL 1 MG/ML IJ SOLN
0.5000 mg | INTRAMUSCULAR | Status: DC | PRN
  Administered 2022-03-18: 0.5 mg via INTRAVENOUS
  Filled 2022-03-18: qty 0.5

## 2022-03-18 MED ORDER — LORATADINE 10 MG PO TABS
10.0000 mg | ORAL_TABLET | Freq: Every day | ORAL | Status: DC
  Administered 2022-03-18 – 2022-03-22 (×5): 10 mg via ORAL
  Filled 2022-03-18 (×5): qty 1

## 2022-03-18 NOTE — ED Notes (Signed)
ED TO INPATIENT HANDOFF REPORT  ED Nurse Name and Phone #: [phone]  S Name/Age/Gender Deborah [name] 57 y.o. female Room/Bed: H018C/H018C  Code Status   Code Status: Full Code  Home/SNF/Other Home Patient oriented to: self, place, time, and situation Is this baseline? Yes   Triage Complete: Triage complete  Chief Complaint AKI (acute kidney injury) ([place]) [N17.9]  Triage Note Patient complains of abd pain N/V for the past 2 days. Patient reports CP today starting about an hour ago starting in the center of chest radiating to the left arm. Patient has not been able to take any of her home medications due to N/V. Per [facility] patient refused care en route and had some spells of escalated behavior towards EMS staff. Per EMS HR 130 BP 160/130, spo2 100%.    Allergies Allergies  Allergen Reactions   Diazepam [name]   Gabapentin [name] and Swelling    Other reaction(s): Unknown   Iodinated Contrast Media Anaphylaxis    11/29/17 Cardiac arrest 1 min after IV contrast, possible allergy vs vasovagal episode Iopamidol  Anaphylaxis  High 11/28/2017  Patient had seizure like activity and then code post 100 cc of isovue 300     Isovue [Iopamidol] Anaphylaxis    11/28/17 Patient had seizure like activity and then 1 min code after 100 cc of isovue 300. Possible contrast allergy vs vasovagal episode   Lisinopril Anaphylaxis    Tongue and mouth swelling   Metoclopramide Other (See Comments)    Tardive dyskinesia   Nsaids Anaphylaxis and Other (See Comments)    ULCER   Penicillins Palpitations    Has patient had a PCN reaction causing immediate rash, facial/tongue/throat swelling, SOB or lightheadedness with hypotension: Yes, heart races Has patient had a PCN reaction causing severe rash involving mucus membranes or skin necrosis: No Has patient had a PCN reaction that required hospitalization: Yes  Has patient had a PCN reaction occurring within the last 10  years: No    Dicyclomine Hcl Other (See Comments)    Reports chest pain   Tolmetin Nausea Only and Other (See Comments)    ULCER   Rifamycins Other (See Comments)    unknown   Acetaminophen Nausea Only and Other (See Comments)    Irritates stomach ulcer; Abdominal pain   Cyclobenzaprine Palpitations    Unknown   Oxycodone Palpitations   Tramadol Nausea And Vomiting    Level of Care/Admitting Diagnosis ED Disposition     ED Disposition  Admit   Condition  --   Comment  Hospital Area: [facility] [[id number]]  Level of Care: Med-Surg [16]  May place patient in observation at [facility] or [facility] if equivalent level of care is available:: No  Covid Evaluation: Asymptomatic - no recent exposure (last 10 days) testing not required  Diagnosis: AKI (acute kidney injury) [facility]) [[id number]]  Admitting Physician: Cephus Slater  Attending Physician: Lind Covert [[id number]]          B Medical/Surgery History Past Medical History:  Diagnosis Date   Acute back pain with sciatica, left    Acute back pain with sciatica, right    AKI (acute kidney injury) ([place])    Anemia, unspecified    Cancer ([place])    Carcinoid tumor of duodenum    Chest pain with normal coronary angiography 2019   Chronic kidney disease, stage 3b (HCC)    Chronic pain    Chronic systolic CHF (congestive heart  failure) ([place])    Diabetes mellitus    DKA (diabetic ketoacidosis) ([place])    Drug-seeking behavior    21 hospitalizations and 14 CT a/p in 2 years for N/V and abdominal pain, demanding only IV dilaudid   Elevated troponin    chronic   Esophageal reflux    Fibromyalgia    Gastric ulcer    Gastroparesis    Gout    Hyperlipidemia    Hypertension    Hypokalemia    Hypomagnesemia    Lumbosacral stenosis    LVH (left ventricular hypertrophy)    Morbid obesity (HCC)    NICM (nonischemic cardiomyopathy) (HCC)    PAF (paroxysmal atrial fibrillation) ([place])    Stroke  ([place]) 02/2011   Thrombocytosis    Vitamin B12 deficiency anemia    Past Surgical History:  Procedure Laterality Date   BIOPSY  07/27/2019   Procedure: BIOPSY;  Surgeon: Clarene Essex, MD;  Location: [facility];  Service: Endoscopy;;   BIOPSY  07/30/2019   Procedure: BIOPSY;  Surgeon: Otis Brace, MD;  Location: [facility];  Service: Gastroenterology;;   CATARACT EXTRACTION  01/2014   CHOLECYSTECTOMY     COLONOSCOPY WITH PROPOFOL N/A 07/30/2019   Procedure: COLONOSCOPY WITH PROPOFOL;  Surgeon: Otis Brace, MD;  Location: [facility];  Service: Gastroenterology;  Laterality: N/A;   ESOPHAGOGASTRODUODENOSCOPY N/A 07/27/2019   Procedure: ESOPHAGOGASTRODUODENOSCOPY (EGD);  Surgeon: Clarene Essex, MD;  Location: [facility];  Service: Endoscopy;  Laterality: N/A;   ESOPHAGOGASTRODUODENOSCOPY N/A 07/26/2020   Procedure: ESOPHAGOGASTRODUODENOSCOPY (EGD);  Surgeon: Arta Silence, MD;  Location: [facility];  Service: Endoscopy;  Laterality: N/A;   ESOPHAGOGASTRODUODENOSCOPY (EGD) WITH PROPOFOL N/A 08/02/2019   Procedure: ESOPHAGOGASTRODUODENOSCOPY (EGD) WITH PROPOFOL;  Surgeon: Otis Brace, MD;  Location: [facility];  Service: Gastroenterology;  Laterality: N/A;   HEMOSTASIS CLIP PLACEMENT  08/02/2019   Procedure: HEMOSTASIS CLIP PLACEMENT;  Surgeon: Otis Brace, MD;  Location: [facility];  Service: Gastroenterology;;   POLYPECTOMY  07/30/2019   Procedure: POLYPECTOMY;  Surgeon: Otis Brace, MD;  Location: [facility];  Service: Gastroenterology;;   POLYPECTOMY  08/02/2019   Procedure: POLYPECTOMY;  Surgeon: Otis Brace, MD;  Location: [facility];  Service: Gastroenterology;;     A IV Location/Drains/Wounds Patient Lines/Drains/Airways Status     Active Line/Drains/Airways     Name Placement date Placement time Site Days   Peripheral IV 03/17/22 22 G 2.5" Anterior;Left Forearm 03/17/22  2046  Forearm  1            Intake/Output Last 24  hours No intake or output data in the 24 hours ending 03/18/22 0139  Labs/Imaging Results for orders placed or performed during the hospital encounter of 03/17/22 (from the past 48 hour(s))  Troponin I (High Sensitivity)     Status: Abnormal   Collection Time: 03/17/22  4:54 PM  Result Value Ref Range   Troponin I (High Sensitivity) 33 (H) <18 ng/L    Comment: (NOTE) Elevated high sensitivity troponin I (hsTnI) values and significant  changes across serial measurements may suggest ACS but many other  chronic and acute conditions are known to elevate hsTnI results.  Refer to the "Links" section for chest pain algorithms and additional  guidance. Performed at [facility], [place] [address], [place], [place] [address]   Lipase, blood     Status: None   Collection Time: 03/17/22  4:54 PM  Result Value Ref Range   Lipase 37 11 - 51 U/L    Comment: Performed at [facility], [place] [address] [place], [place] [address]  CBC with Differential     Status: Abnormal   Collection Time: 03/17/22  4:54 PM  Result Value Ref Range   WBC 14.6 (H) 4.0 - 10.5 K/uL   RBC 2.85 (L) 3.87 - 5.11 MIL/uL   Hemoglobin 8.5 (L) 12.0 - 15.0 g/dL   HCT 26.6 (L) 36.0 - 46.0 %   MCV 93.3 80.0 - 100.0 fL   MCH 29.8 26.0 - 34.0 pg   MCHC 32.0 30.0 - 36.0 g/dL   RDW 13.2 11.5 - 15.5 %   Platelets 464 (H) 150 - 400 K/uL   nRBC 0.0 0.0 - 0.2 %   Neutrophils Relative % 78 %   Neutro Abs 11.6 (H) 1.7 - 7.7 K/uL   Lymphocytes Relative 14 %   Lymphs Abs 2.0 0.7 - 4.0 K/uL   Monocytes Relative 5 %   Monocytes Absolute 0.7 0.1 - 1.0 K/uL   Eosinophils Relative 1 %   Eosinophils Absolute 0.1 0.0 - 0.5 K/uL   Basophils Relative 1 %   Basophils Absolute 0.1 0.0 - 0.1 K/uL   Immature Granulocytes 1 %   Abs Immature Granulocytes 0.10 (H) 0.00 - 0.07 K/uL    Comment: Performed at [facility] [address] [name], [place] [address]  Comprehensive metabolic panel     Status: Abnormal   Collection  Time: 03/17/22  4:54 PM  Result Value Ref Range   Sodium 129 (L) 135 - 145 mmol/L   Potassium 5.6 (H) 3.5 - 5.1 mmol/L    Comment: HEMOLYSIS AT THIS LEVEL MAY AFFECT RESULT   Chloride 95 (L) 98 - 111 mmol/L   CO2 19 (L) 22 - 32 mmol/L   Glucose, Bld 442 (H) 70 - 99 mg/dL    Comment: Glucose reference range applies only to samples taken after fasting for at least 8 hours.   BUN 53 (H) 6 - 20 mg/dL   Creatinine, Ser 4.27 (H) 0.44 - 1.00 mg/dL   Calcium 9.0 8.9 - 10.3 mg/dL   Total Protein 7.4 6.5 - 8.1 g/dL   Albumin 3.1 (L) 3.5 - 5.0 g/dL   AST 22 15 - 41 U/L    Comment: HEMOLYSIS AT THIS LEVEL MAY AFFECT RESULT   ALT 13 0 - 44 U/L    Comment: HEMOLYSIS AT THIS LEVEL MAY AFFECT RESULT   Alkaline Phosphatase 106 38 - 126 U/L   Total Bilirubin 1.3 (H) 0.3 - 1.2 mg/dL    Comment: HEMOLYSIS AT THIS LEVEL MAY AFFECT RESULT   GFR, Estimated 12 (L) >60 mL/min    Comment: (NOTE) Calculated using the CKD-EPI Creatinine Equation (2021)    Anion gap 15 5 - 15    Comment: Performed at [facility], [place] [address], [place], [place] [address]  Brain natriuretic peptide     Status: None   Collection Time: 03/17/22  4:54 PM  Result Value Ref Range   B Natriuretic Peptide 43.9 0.0 - 100.0 pg/mL    Comment: Performed at [facility] [address], [place], [place] [address]  POC CBG, ED     Status: Abnormal   Collection Time: 03/17/22  4:59 PM  Result Value Ref Range   Glucose-Capillary 399 (H) 70 - 99 mg/dL    Comment: Glucose reference range applies only to samples taken after fasting for at least 8 hours.  I-Stat venous blood gas, ([facility], [facility], [facility])  Status: Abnormal   Collection Time: 03/17/22  6:42 PM  Result Value Ref Range   pH, Ven 7.526 (H) 7.25 - 7.43   pCO2, Ven 25.2 (L) 44 - 60 mmHg   pO2, Ven 110 (H) 32 - 45 mmHg   Bicarbonate 20.9 20.0 - 28.0 mmol/L   TCO2 22 22 - 32 mmol/L   O2 Saturation 99 %   Acid-base deficit 1.0 0.0 - 2.0 mmol/L   Sodium 129 (L) 135 -  145 mmol/L   Potassium 5.8 (H) 3.5 - 5.1 mmol/L   Calcium, Ion 1.01 (L) 1.15 - 1.40 mmol/L   HCT 26.0 (L) 36.0 - 46.0 %   Hemoglobin 8.8 (L) 12.0 - 15.0 g/dL   Sample type VENOUS   Troponin I (High Sensitivity)     Status: Abnormal   Collection Time: 03/17/22  9:22 PM  Result Value Ref Range   Troponin I (High Sensitivity) 29 (H) <18 ng/L    Comment: (NOTE) Elevated high sensitivity troponin I (hsTnI) values and significant  changes across serial measurements may suggest ACS but many other  chronic and acute conditions are known to elevate hsTnI results.  Refer to the "Links" section for chest pain algorithms and additional  guidance. Performed at [facility], [place] [address], [place], [place] [address]   Beta-hydroxybutyric acid     Status: Abnormal   Collection Time: 03/17/22  9:22 PM  Result Value Ref Range   Beta-Hydroxybutyric Acid 0.51 (H) 0.05 - 0.27 mmol/L    Comment: Performed at [facility] [address] [place], [place] [address]  Urinalysis, Routine w reflex microscopic     Status: Abnormal   Collection Time: 03/17/22  9:38 PM  Result Value Ref Range   Color, Urine YELLOW YELLOW   APPearance CLEAR CLEAR   Specific Gravity, Urine 1.011 1.005 - 1.030   pH 5.0 5.0 - 8.0   Glucose, UA 150 (A) NEGATIVE mg/dL   Hgb urine dipstick SMALL (A) NEGATIVE   Bilirubin Urine NEGATIVE NEGATIVE   Ketones, ur NEGATIVE NEGATIVE mg/dL   Protein, ur 100 (A) NEGATIVE mg/dL   Nitrite NEGATIVE NEGATIVE   Leukocytes,Ua NEGATIVE NEGATIVE   RBC / HPF 0-5 0 - 5 RBC/hpf   WBC, UA 0-5 0 - 5 WBC/hpf   Bacteria, UA NONE SEEN NONE SEEN   Squamous Epithelial / HPF 0-5 0 - 5 /HPF   Mucus PRESENT     Comment: Performed at [facility] [address], [place],  [address]  CBG monitoring, ED     Status: Abnormal   Collection Time: 03/17/22 10:10 PM  Result Value Ref Range   Glucose-Capillary 334 (H) 70 - 99 mg/dL    Comment: Glucose reference range applies only to  samples taken after fasting for at least 8 hours.  CBG monitoring, ED     Status: Abnormal   Collection Time: 03/17/22 11:49 PM  Result Value Ref Range   Glucose-Capillary 176 (H) 70 - 99 mg/dL    Comment: Glucose reference range applies only to samples taken after fasting for at least 8 hours.   CT ABDOMEN PELVIS WO CONTRAST  Result Date: 03/17/2022 CLINICAL DATA:  Abdominal pain, acute, nonlocalized, nausea, vomiting. EXAM: CT ABDOMEN AND PELVIS WITHOUT CONTRAST TECHNIQUE: Multidetector CT imaging of the abdomen and pelvis was performed following the standard protocol without IV contrast. RADIATION DOSE REDUCTION: This exam was performed according to the departmental dose-optimization program which includes automated exposure control, adjustment of  the mA and/or kV according to patient size and/or use of iterative reconstruction technique. COMPARISON:  09/10/2021 FINDINGS: Lower chest: No acute abnormality. Hepatobiliary: Stable mild hepatomegaly. No focal intrahepatic mass on this noncontrast examination. No intra or extrahepatic biliary ductal dilation. Status post cholecystectomy. Pancreas: Unremarkable Spleen: Unremarkable Adrenals/Urinary Tract: Adrenal glands are unremarkable. Kidneys are normal, without renal calculi, focal lesion, or hydronephrosis. Bladder is unremarkable. Stomach/Bowel: Stomach is within normal limits. Appendix appears normal. No evidence of bowel wall thickening, distention, or inflammatory changes. Vascular/Lymphatic: Minimal atherosclerotic calcification within the abdominal aorta. No aortic aneurysm. No pathologic adenopathy within the abdomen and pelvis. Reproductive: 4.8 cm hyperdense mass abuts the uterine fundus extending into the left adnexa and is previously characterized on MRI examination of 11/29/2017 as an exophytic uterine fibroid. This is stable. The pelvic organs are otherwise unremarkable. Other: No abdominal wall hernia or abnormality. No abdominopelvic  ascites. Musculoskeletal: Degenerative changes are seen within the lumbar spine. No lytic or blastic bone lesion. No acute bone abnormality. IMPRESSION: 1. No acute intra-abdominal pathology identified. No definite radiographic explanation for the patient's reported symptoms. 2. Stable mild hepatomegaly. 3. Stable 4.8 cm exophytic uterine fibroid. Aortic Atherosclerosis (ICD10-I70.0). Electronically Signed   By: Fidela Salisbury M.D.   On: 03/17/2022 19:50   DG Chest 2 View  Result Date: 03/17/2022 CLINICAL DATA:  Chest pain EXAM: CHEST - 2 VIEW COMPARISON:  09/10/2021 and older. FINDINGS: Under penetrated radiographs. Underinflation. No consolidation, pneumothorax or effusion. Normal cardiopericardial silhouette without edema. Frontal views are also somewhat lordotic. Degenerative changes of the spine. IMPRESSION: No acute cardiopulmonary disease. Electronically Signed   By: Jill Side M.D.   On: 03/17/2022 17:22    Pending Labs Unresulted Labs (From admission, onward)     Start     Ordered   03/18/22 0500  CBC  Tomorrow morning,   R        03/17/22 2323   03/18/22 0500  Comprehensive metabolic panel  Tomorrow morning,   R        03/17/22 2323   03/17/22 2314  HIV Antibody (routine testing w rflx)  (HIV Antibody (Routine testing w reflex) panel)  Once,   R        03/17/22 2323            Vitals/Pain Today's Vitals   03/17/22 2215 03/17/22 2230 03/17/22 2307 03/18/22 0105  BP: 131/74 130/74 (!) 109/90 98/63  Pulse: (!) 117 (!) 118 (!) 120 (!) 107  Resp: (!) '22 20 20 18  '$ Temp:    98.4 F (36.9 C)  SpO2: 96% 99% 98% 95%  PainSc:    Asleep    Isolation Precautions No active isolations  Medications Medications  dextrose 50 % solution 0-50 mL (has no administration in time range)  acetaminophen (TYLENOL) tablet 650 mg (has no administration in time range)    Or  acetaminophen (TYLENOL) suppository 650 mg (has no administration in time range)  insulin glargine-yfgn (SEMGLEE)  injection 25 Units (25 Units Subcutaneous Given 03/18/22 0137)  insulin aspart (novoLOG) injection 0-5 Units ( Subcutaneous Not Given 03/17/22 2351)  insulin aspart (novoLOG) injection 0-15 Units (has no administration in time range)  atorvastatin (LIPITOR) tablet 10 mg (has no administration in time range)  isosorbide mononitrate (IMDUR) 24 hr tablet 60 mg (has no administration in time range)  metoprolol succinate (TOPROL-XL) 24 hr tablet 50 mg (has no administration in time range)  DULoxetine (CYMBALTA) DR capsule 40 mg (has no administration in time range)  famotidine (PEPCID) tablet 20 mg (has no administration in time range)  linaclotide (LINZESS) capsule 145 mcg (has no administration in time range)  sodium bicarbonate tablet 650 mg (has no administration in time range)  pantoprazole (PROTONIX) EC tablet 40 mg (has no administration in time range)  ondansetron (ZOFRAN-ODT) disintegrating tablet 4 mg (has no administration in time range)  apixaban (ELIQUIS) tablet 5 mg (has no administration in time range)  folic acid (FOLVITE) tablet 1 mg (has no administration in time range)  loratadine (CLARITIN) tablet 10 mg (has no administration in time range)  fluticasone (FLONASE) 50 MCG/ACT nasal spray 1 spray (has no administration in time range)  HYDROmorphone (DILAUDID) injection 0.5 mg (0.5 mg Intramuscular Given 03/18/22 0030)  HYDROmorphone (DILAUDID) injection 1 mg (1 mg Intravenous Given 03/17/22 1801)  lactated ringers bolus 500 mL (0 mLs Intravenous Stopped 03/17/22 1908)  lactated ringers bolus 500 mL (0 mLs Intravenous Stopped 03/17/22 2211)  HYDROmorphone (DILAUDID) injection 1 mg (1 mg Intramuscular Given 03/17/22 2008)  sodium zirconium cyclosilicate (LOKELMA) packet 5 g (5 g Oral Given 03/18/22 0032)    Mobility manual wheelchair / walks some Low fall risk   Focused Assessments     R Recommendations: See Admitting Provider Note  Report given to:   Additional Notes: BSC or  W/C for BR / on RA / [place] 22 L FA /

## 2022-03-18 NOTE — Assessment & Plan Note (Addendum)
Cr improving 5.07>3.52 after receiving IV fluids.  Renal U/S unremarkable.  Nephrology following, patient appears volume overloaded. -Nephrology following, appreciate their care and recommendations -D/C IV fluids -1 dose IV Lasix 40 mg per nephrology -Hold home torsemide, spironolactone - AM RFP, Mg -Strict I/O

## 2022-03-18 NOTE — Progress Notes (Signed)
PT Cancellation Note  Patient Details Name: Deborah [name] MRN: [id number] DOB: 1966/02/06   Cancelled Treatment:    Reason Eval/Treat Not Completed: Patient declined, no reason specified; attempted second time, patient relates she does not feel well and is already in the chair.  Declined wheelchair mobility or standing attempts.  States getting to [facility] for toileting.  Feels mobility is at her baseline and not wanting to progress to standing or ambulation.  PT will sign off as pt declining.  Please re-consult if patient becomes interested to progress mobility.   Reginia Naas 03/18/2022, 3:18 PM Magda Kiel, PT Acute Rehabilitation Services Office:[phone] 03/18/2022

## 2022-03-18 NOTE — Plan of Care (Signed)

## 2022-03-18 NOTE — Assessment & Plan Note (Addendum)
Home regimen: Lantus 50 qhs, Lispro 30 QID. Received semglee 50 U and 30 aspart yesterday - Increase Semglee 60u qhs - modSSI for mealtime, add mealtime coverage if needed - monitor CBG

## 2022-03-18 NOTE — Evaluation (Signed)
Occupational Therapy Evaluation Patient Details Name: Deborah [name] MRN: [id number] DOB: [birth year] Today's Date: 03/18/2022   History of Present Illness Pt is a 57 y/o female admitted 1/10 presents with abm pain, n/v, and diabetic gastroparesis. PMH of DM 2, gastroparesis, HTN, gout, anxiety, insomina, fibromyalgia, stroke, and chronic pain.   Clinical Impression   Pt presents to OT with impairments in balance and functional activity tolerance. She admits baseline she spends most of her time in the w/c and completes transfers only. Encouraged [place] and participation here in acute OT to improve independence and quality of life. Pt resistant but states she will consider. She has considerable fear of falling and resistance to AE d/t past falls using a RW. Pt would benefit from continued acute OT services to facilitate safe d/c home and optimize occupational performance.       Recommendations for follow up therapy are one component of a multi-disciplinary discharge planning process, led by the attending physician.  Recommendations may be updated based on patient status, additional functional criteria and insurance authorization.   Follow Up Recommendations  Home health OT     Assistance Recommended at Discharge PRN  Patient can return home with the following Assistance with cooking/housework;Assist for transportation;A little help with walking and/or transfers    Functional Status Assessment  Patient has had a recent decline in their functional status and demonstrates the ability to make significant improvements in function in a reasonable and predictable amount of time.  Equipment Recommendations  None recommended by OT    Recommendations for Other Services       Precautions / Restrictions Precautions Precautions: Fall      Mobility Bed Mobility Overal bed mobility: Needs Assistance Bed Mobility: Supine to Sit, Sit to Supine     Supine to sit: Modified independent  (Device/Increase time) Sit to supine: Modified independent (Device/Increase time)        Transfers Overall transfer level: Needs assistance Equipment used: None Transfers: Bed to chair/wheelchair/BSC, Sit to/from Stand Sit to Stand: Supervision Stand pivot transfers: Supervision                Balance Overall balance assessment: Needs assistance Sitting-balance support: Feet supported, No upper extremity supported Sitting balance-Leahy Scale: Good     Standing balance support: During functional activity, Single extremity supported Standing balance-Leahy Scale: Fair Standing balance comment: High fall risk, reliant on furniture but very resistant to RW use or any change in routine                           ADL either performed or assessed with clinical judgement   ADL Overall ADL's : Needs assistance/impaired Eating/Feeding: Independent   Grooming: Wash/dry hands;Wash/dry face;Independent   Upper Body Bathing: Modified independent   Lower Body Bathing: Supervison/ safety   Upper Body Dressing : Modified independent   Lower Body Dressing: Supervision/safety   Toilet Transfer: Supervision/safety   Toileting- Clothing Manipulation and Hygiene: Supervision/safety       Functional mobility during ADLs: Supervision/safety       Vision Baseline Vision/History: 0 No visual deficits Ability to See in Adequate Light: 0 Adequate Patient Visual Report: No change from baseline Vision Assessment?: No apparent visual deficits     Perception     Praxis      Pertinent Vitals/Pain Pain Assessment Pain Assessment: 0-10 Pain Score: 6  Pain Descriptors / Indicators: Aching Pain Intervention(s): Monitored during session     Hand Dominance Right  Extremity/Trunk Assessment Upper Extremity Assessment Upper Extremity Assessment: Generalized weakness   Lower Extremity Assessment Lower Extremity Assessment: Generalized weakness       Communication  Communication Communication: No difficulties   Cognition Arousal/Alertness: Awake/alert Behavior During Therapy: WFL for tasks assessed/performed Overall Cognitive Status: Within Functional Limits for tasks assessed                                                  Home Living Family/patient expects to be discharged to:: Private residence Living Arrangements: Alone Available Help at Discharge: Family;Available PRN/intermittently Type of Home: Apartment Home Access: Level entry     Home Layout: One level     Bathroom Shower/Tub: Teacher, early years/pre: Standard     Home Equipment: BSC/3in1;Shower seat;Wheelchair - manual   Additional Comments: Aide M-Sat a few hours day      Prior Functioning/Environment Prior Level of Function : Needs assist       Physical Assist : Mobility (physical);ADLs (physical)       ADLs Comments: Pt reports her aide helps her put on socks, IADL, takes her to doctors appointment, laundry, dishes        OT Problem List: Decreased strength;Pain;Decreased activity tolerance;Decreased knowledge of use of DME or AE;Impaired balance (sitting and/or standing);Decreased safety awareness      OT Treatment/Interventions: Self-care/ADL training;Therapeutic exercise;Therapeutic activities;DME and/or AE instruction;Patient/family education    OT Goals(Current goals can be found in the care plan section) Acute Rehab OT Goals Patient Stated Goal: Get back home OT Goal Formulation: With patient Time For Goal Achievement: 04/01/22 Potential to Achieve Goals: Fair  OT Frequency: Min 2X/week    Co-evaluation              AM-PAC OT "6 Clicks" Daily Activity     Outcome Measure Help from another person eating meals?: None Help from another person taking care of personal grooming?: None Help from another person toileting, which includes using toliet, bedpan, or urinal?: A Little Help from another person bathing  (including washing, rinsing, drying)?: A Little Help from another person to put on and taking off regular upper body clothing?: None Help from another person to put on and taking off regular lower body clothing?: A Little 6 Click Score: 21   End of Session Equipment Utilized During Treatment: Gait belt Nurse Communication: Mobility status  Activity Tolerance:   Patient left: in bed  OT Visit Diagnosis: Unsteadiness on feet (R26.81);Muscle weakness (generalized) (M62.81);Repeated falls (R29.6)                Time: 7628-3151 OT Time Calculation (min): 34 min Charges:  OT General Charges $OT Visit: 1 Visit OT Evaluation $OT Eval Low Complexity: 1 Low OT Treatments $Self Care/Home Management : 8-22 mins  Laverle Hobby, OTR/L, CBIS Acute Rehab Office: [phone]   Curtis Sites 03/18/2022, 10:50 AM

## 2022-03-18 NOTE — Assessment & Plan Note (Addendum)
Still in pain, still nauseous, not vomiting. On home Dilaudid and has been getting IV Dilaudid as needed.  Suspect that chronic dilaudid use is possibly also contributing to gastroparesis. -Continue home Dilaudid, DC IV Dilaudid - Tx hyperglycemia/T2DM, AKI - Cont home Linzess  - Phenergan q6h prn for nausea/vomiting - Per chart review, pt has adverse reaction to metoclopramide, erythromycin, and Bentyl.  - likely needs outpatient GI follow up - Vitals per floor

## 2022-03-18 NOTE — Progress Notes (Signed)
PT Cancellation Note  Patient Details Name: Deborah [name] MRN: [id number] DOB: [birth year]   Cancelled Treatment:    Reason Eval/Treat Not Completed: Pain limiting ability to participate; patient in recliner following OT earlier.  Declines PT currently due to abdominal pain.  RN delivering meds while PT in the room.  Will attempt later.   Reginia Naas 03/18/2022, 10:43 AM Magda Kiel, PT Acute Rehabilitation Services Office:[phone] 03/18/2022

## 2022-03-18 NOTE — Assessment & Plan Note (Addendum)
Tachycardic to 130s on arrival, improved to 100-110s with treatment of other conditions. EKG sinus tachy. Pt notably has a history of PE and PAF (on Eliquis) but has not shown signs of hypoxemia or respiratory distress. However, she did initially present with atypical chest pain thought to be more likely MSK related.  -Consider V/Q scan given AKI, especially if worsening respiratory status

## 2022-03-18 NOTE — Assessment & Plan Note (Addendum)
Hx PAF. HR stable -Treat other conditions as above -Continue home metop

## 2022-03-18 NOTE — Assessment & Plan Note (Addendum)
Most recent echo 09/2021 with EF 50-55%. Holding home torsemide due to AKI (dose was recently increased from '40mg'$  qd to '50mg'$  BID). Euvolemic on exam. -Daily weights, strict I/O -Consider repeat echo if worsening fluid/respiratory status -Caution with fluids as noted -Consider decreasing torsemide dose upon discharge

## 2022-03-18 NOTE — Assessment & Plan Note (Addendum)
WBC noted on admission. Improving and stable. Reassuringly, pt is afebrile and has no obvious signs of infection (CXR wnl, UA not c/f UTI). Leukocytosis most likely stress reaction to N/V and pain. - Monitor on CBC - Monitor for fever

## 2022-03-18 NOTE — Assessment & Plan Note (Addendum)
Stable. Had brief hypotensive episodes requiring IV bolus. - Cont home metoprolol - Holding home hydralazine to avoid hypotension - hold spiro, torsemide given AKI. Treating AKI/CKD as above

## 2022-03-18 NOTE — Progress Notes (Signed)
Daily Progress Note Intern Pager: [phone]  Patient name: Deborah [name] Medical record number: [id number] Date of birth: 09-May-1965 Age: 57 y.o. Gender: female  Primary Care Provider: Cena Benton, MD Consultants: None Code Status: Full  Pt Overview and Major Events to Date:  1/10 - admitted  Assessment and Plan:  Deborah [name] is a 57 y.o. female p/w diffuse abdominal pain in the setting of chronic diabetic gastroparesis, also found to have AKI.   Pertinent PMH/PSH includes T2DM, diabetic gastroparesis, malignant carcinoid tumor of the duodenum, PAF, CKD, HFpEF (EF 50-55%), HTN.    * Diabetic gastroparesis (HCC) Abdominal pain most likely 2/2 acute exacerbation of chronic diabetic gastroparesis. CTAP with no acute process. Improving with IV pain medication. However, suspect that chronic dilaudid use is likely also contributing. - Restart home Dilaudid, prn IV Dilaudid q6h for breakthrough pain - Tx hyperglycemia/T2DM (see below) - Cont home Linzess prn - Cont home Zofran prn - Per chart review, pt has adverse reaction to metoclopramide, erythromycin, and Bentyl.  - Vitals per floor - AM CBC, BMP  Type 2 diabetes mellitus with hyperglycemia, with long-term current use of insulin (HCC) Glucose elevated to 399 on admission. Most recent A1c 7.8 11/2021. This has improved (most recent 163) with 25U basal insulin and SSI, though she was on Endotool briefly. Unclear etiology of this initial hyperglycemia as she reports medication adherence. (Home regimen: Lantus 50 qhs, Lispro 30 QID)  - Semglee 25u qhs (for basal)  - modSSI for mealtime, add mealtime coverage if needed - monitor CBG  Tachycardia Tachycardic to 130s on arrival, improved with treatment of other conditions. EKG sinus tachy. Currently still in low 100s. Hx PAF. BP stable. Restarted on home metoprolol. Her pain and recent N/V likely contributing. -Treat other conditions as above -Continue  home metop  Acute kidney injury superimposed on chronic kidney disease ([place]) Likely pre-renal in setting of N/V and dehydration. UA not suggestive of ATN. No obstruction noted on CT. Additionally, her home torsemide dose was recently increased from '40mg'$  qd to '50mg'$  BID. S/p LR boluses x3. -NS IVF at 136m/hr, cautious due to CHF -Hold home torsemide, spironolactone - AM CMP  HFrEF (heart failure with reduced ejection fraction) ([facility] Most recent echo 09/2021 with EF 50-55%. Holding home torsemide due to AKI (dose was recently increased from '40mg'$  qd to '50mg'$  BID). Euvolemic on exam. -Daily weights, strict I/O -Consider repeat echo if worsening fluid/respiratory status -Caution with fluids as above -Consider decreasing torsemide dose upon discharge w/ close follow up, due to AKI  Chronic pain Chronically on home Dilaudid '4mg'$  BID, follows with PMR. 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 Suspect that this and her gastroparesis are contributing to her overall pain. She is appropriately hesitant to self medicate with severe pain episodes and must present to the ED for IV medication which is likely what triggered this current presentation. -Pain control as above  Leukocytosis WBC noted on admission. Reassuringly, pt is afebrile and has no obvious signs of infection (CXR wnl, UA not c/f UTI). Leukocytosis most likely stress reaction to N/V and pain. - Monitor on CBC - Monitor for fever  Essential hypertension, benign BP elevated on admission, most likely 2/2 pain but also with soft BP.  - Cont home metoprolol - Holding home hydralazine to avoid hypotension - hold spiro, torsemide given AKI. Restart once Cr improves.        FEN/GI: Carb modified PPx:  home Eliquis (hx PAF) Dispo:Pending PT recommendations , continuing treatment of her AKI and pain  Subjective:  Feeling a little better this morning. States that she is chronically on Dilaudid and  was instructed not to self medicate when she has severe pain. Thus, she has to present to the ED with episodes like this one in order to get her pain under control. She feels her pain is similar to her chronic gastroparesis pain albeit sharper than typical. Denies CP, SOB. Most recent BM 3-4 days ago. Denies additional N/V.  Objective: Temp:  [97.5 F (36.4 C)-98.8 F (37.1 C)] 97.7 F (36.5 C) (01/11 1148) Pulse Rate:  [107-134] 110 (01/11 1148) Resp:  [15-22] 18 (01/11 1148) BP: (92-155)/(56-95) 92/57 (01/11 1148) SpO2:  [95 %-100 %] 100 % (01/11 1148) Physical Exam: General: NAD sitting on side of bed, appears mildly uncomfortable Cardiovascular: RRR no MRG Respiratory: CTAB normal WOB on RA Abdomen: soft, non-distended, +tender to palpation in LLQ and RLQ with no rebound or guarding Extremities: lower extremities without significant edema  Laboratory: Most recent CBC Lab Results  Component Value Date   WBC 14.3 (H) 03/18/2022   HGB 8.4 (L) 03/18/2022   HCT 27.1 (L) 03/18/2022   MCV 93.1 03/18/2022   PLT 445 (H) 03/18/2022   Most recent BMP    Latest Ref Rng & Units 03/18/2022    3:17 AM  BMP  Glucose 70 - 99 mg/dL 163   BUN 6 - 20 mg/dL 52   Creatinine 0.44 - 1.00 mg/dL 4.41   Sodium 135 - 145 mmol/L 136   Potassium 3.5 - 5.1 mmol/L 3.9   Chloride 98 - 111 mmol/L 101   CO2 22 - 32 mmol/L 21   Calcium 8.9 - 10.3 mg/dL 9.2      August Albino, MD 03/18/2022, 12:05 PM  PGY-1, [place] Intern pager: [phone], text pages welcome Secure chat group [place]

## 2022-03-18 NOTE — Assessment & Plan Note (Addendum)
K elevated on admission, however likely spurious as sample was hemolyzed. - s/p Lokelma in ED - AM CMP

## 2022-03-18 NOTE — Assessment & Plan Note (Signed)
VBG on admission c/w respiratory alkalosis w/ metabolic compensation. Given tachypnea, this is most likely due to hyperventilation in setting of uncontrolled pain.  - Monitor resp status. Consider repeat VBG if tachypnea does not resolve.

## 2022-03-18 NOTE — Assessment & Plan Note (Addendum)
Chronically on home Dilaudid '4mg'$  BID, follows with PMR. 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 Suspect that this and her gastroparesis are contributing to her overall pain. She is appropriately hesitant to self medicate with severe pain episodes and must present to the ED for IV medication which is likely what triggered this current presentation. -Pain control as above -Dilaudid likely contributing to gastroparesis, worth discussing w/ patient

## 2022-03-19 ENCOUNTER — Inpatient Hospital Stay (HOSPITAL_COMMUNITY): Payer: 59

## 2022-03-19 DIAGNOSIS — N179 Acute kidney failure, unspecified: Secondary | ICD-10-CM | POA: Diagnosis not present

## 2022-03-19 DIAGNOSIS — N189 Chronic kidney disease, unspecified: Secondary | ICD-10-CM | POA: Diagnosis not present

## 2022-03-19 LAB — BASIC METABOLIC PANEL
Anion gap: 12 (ref 5–15)
BUN: 59 mg/dL — ABNORMAL HIGH (ref 6–20)
CO2: 19 mmol/L — ABNORMAL LOW (ref 22–32)
Calcium: 7.8 mg/dL — ABNORMAL LOW (ref 8.9–10.3)
Chloride: 98 mmol/L (ref 98–111)
Creatinine, Ser: 5.93 mg/dL — ABNORMAL HIGH (ref 0.44–1.00)
GFR, Estimated: 8 mL/min — ABNORMAL LOW (ref >60)
Glucose, Bld: 299 mg/dL — ABNORMAL HIGH (ref 70–99)
Potassium: 4.7 mmol/L (ref 3.5–5.1)
Sodium: 129 mmol/L — ABNORMAL LOW (ref 135–145)

## 2022-03-19 LAB — CBC
HCT: 24.3 % — ABNORMAL LOW (ref 36.0–46.0)
Hemoglobin: 7.2 g/dL — ABNORMAL LOW (ref 12.0–15.0)
MCH: 28.6 pg (ref 26.0–34.0)
MCHC: 29.6 g/dL — ABNORMAL LOW (ref 30.0–36.0)
MCV: 96.4 fL (ref 80.0–100.0)
Platelets: 365 10*3/uL (ref 150–400)
RBC: 2.52 MIL/uL — ABNORMAL LOW (ref 3.87–5.11)
RDW: 13.1 % (ref 11.5–15.5)
WBC: 12.3 10*3/uL — ABNORMAL HIGH (ref 4.0–10.5)
nRBC: 0 % (ref 0.0–0.2)

## 2022-03-19 LAB — FERRITIN: Ferritin: 306 ng/mL (ref 11–307)

## 2022-03-19 LAB — GLUCOSE, CAPILLARY
Glucose-Capillary: 275 mg/dL — ABNORMAL HIGH (ref 70–99)
Glucose-Capillary: 203 mg/dL — ABNORMAL HIGH (ref 70–99)
Glucose-Capillary: 307 mg/dL — ABNORMAL HIGH (ref 70–99)
Glucose-Capillary: 223 mg/dL — ABNORMAL HIGH (ref 70–99)
Glucose-Capillary: 271 mg/dL — ABNORMAL HIGH (ref 70–99)

## 2022-03-19 LAB — HIV ANTIBODY (ROUTINE TESTING W REFLEX): HIV Screen 4th Generation wRfx: NONREACTIVE

## 2022-03-19 LAB — IRON AND TIBC
Iron: 64 ug/dL (ref 28–170)
Saturation Ratios: 19 % (ref 10.4–31.8)
TIBC: 346 ug/dL (ref 250–450)
UIBC: 282 ug/dL

## 2022-03-19 MED ORDER — LINACLOTIDE 145 MCG PO CAPS
145.0000 ug | ORAL_CAPSULE | Freq: Every day | ORAL | Status: DC
  Administered 2022-03-20 – 2022-03-22 (×3): 145 ug via ORAL
  Filled 2022-03-19 (×4): qty 1

## 2022-03-19 MED ORDER — DULOXETINE HCL 20 MG PO CPEP
40.0000 mg | ORAL_CAPSULE | Freq: Every day | ORAL | Status: DC
  Administered 2022-03-20 – 2022-03-22 (×3): 40 mg via ORAL
  Filled 2022-03-19 (×3): qty 2

## 2022-03-19 MED ORDER — INSULIN GLARGINE-YFGN 100 UNIT/ML ~~LOC~~ SOLN
50.0000 [IU] | Freq: Every day | SUBCUTANEOUS | Status: DC
  Administered 2022-03-19 – 2022-03-20 (×2): 50 [IU] via SUBCUTANEOUS
  Filled 2022-03-19 (×3): qty 0.5

## 2022-03-19 MED ORDER — PROMETHAZINE HCL 25 MG PO TABS
25.0000 mg | ORAL_TABLET | Freq: Four times a day (QID) | ORAL | Status: DC | PRN
  Filled 2022-03-19: qty 1

## 2022-03-19 NOTE — Progress Notes (Signed)
Daily Progress Note Intern Pager: [phone]  Patient name: Deborah [name] Medical record number: [id number] Date of birth: Mar 24, 1965 Age: 57 y.o. Gender: female  Primary Care Provider: Cena Benton, MD Consultants: Nephrology Code Status: Full  Pt Overview and Major Events to Date:  1/10 - admitted  Assessment and Plan:  Deborah [name] is a 57 y.o. female p/w diffuse abdominal pain in the setting of chronic diabetic gastroparesis, also noted to have acute on chronic kidney disease. Nephrology consulted due to worsening of her CKD.   Pertinent PMH/PSH includes T2DM, diabetic gastroparesis, malignant carcinoid tumor of the duodenum, PAF, CKD, HFpEF (EF 50-55%), HTN.    * Acute kidney injury superimposed on chronic kidney disease ([place]) Thought to be pre-renal, but now worsening after 1 day of IV fluids. Cr 4.27>4.41>5.93. Suspect possible progression of her CKD. She is mildly hyponatremic and her bicarb is also slightly low. Potassium wnl. Euvolemic on exam with no evidence of toxic uremia. No hx of intoxication. -Nephrology consulted, appreciate their care and recommendations -Check urine lytes, renal u/s -Continue NS IVF at 152m/hr, cautious due to CHF -Hold home torsemide, spironolactone - AM CMP  Diabetic gastroparesis ([place] Improving with IV pain medication. Also on home Dilaudid. However, suspect that chronic dilaudid use is likely also contributing. - home Dilaudid, prn IV Dilaudid q6h for breakthrough pain - Tx hyperglycemia/T2DM, AKI - Cont home Linzess  - Phenergan q6h prn for nausea/vomiting - Per chart review, pt has adverse reaction to metoclopramide, erythromycin, and Bentyl.  - Vitals per floor - AM CBC, BMP  Type 2 diabetes mellitus with hyperglycemia, with long-term current use of insulin (HCC) P/w glucose 399 on admission. Improving but still not fully controlled. Unclear etiology of her initial hyperglycemia as she reports  medication adherence. (Home regimen: Lantus 50 qhs, Lispro 30 QID)  - Semglee 50u qhs (for basal)  - modSSI for mealtime, add mealtime coverage if needed - monitor CBG  Tachycardia Hx PAF. BP stable currently but has required fluid boluses. On home metoprolol. Her pain and recent N/V likely contributing. -Treat other conditions as above -Continue home metop  HFrEF (heart failure with reduced ejection fraction) ([place] Most recent echo 09/2021 with EF 50-55%. Holding home torsemide due to AKI (dose was recently increased from '40mg'$  qd to '50mg'$  BID). Euvolemic on exam. -Daily weights, strict I/O -Consider repeat echo if worsening fluid/respiratory status -Caution with fluids as noted -Consider decreasing torsemide dose upon discharge   Chronic pain Chronically on home Dilaudid '4mg'$  BID, follows with PMR. 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 Suspect that this and her gastroparesis are contributing to her overall pain. She is appropriately hesitant to self medicate with severe pain episodes and must present to the ED for IV medication which is likely what triggered this current presentation. -Pain control as above -Dilaudid likely contributing to gastroparesis, worth discussing w/ patient  Leukocytosis WBC noted on admission. Improving and stable. Reassuringly, pt is afebrile and has no obvious signs of infection (CXR wnl, UA not c/f UTI). Leukocytosis most likely stress reaction to N/V and pain. - Monitor on CBC - Monitor for fever  Essential hypertension, benign BP elevated on admission, most likely 2/2 pain but also with intermittent soft BP requiring fluid bolus. - Cont home metoprolol - Holding home hydralazine to avoid hypotension - hold spiro, torsemide given AKI. Treating AKI/CKD as above       FEN/GI: carb modified PPx: home  eliquis Dispo:pending further eval/mgmt  Subjective:  Overall her pain has improved. However, she had a  rough night - vomited around 0330 and had abdominal pain. States that phenergan is better than zofran for her nausea. Discussed worsening of her kidney function and that nephrology has been consulted.  Objective: Temp:  [97.3 F (36.3 C)-98.4 F (36.9 C)] 97.3 F (36.3 C) (01/12 0900) Pulse Rate:  [81-110] 86 (01/12 0900) Resp:  [15-18] 18 (01/12 0900) BP: (72-124)/(55-86) 124/86 (01/12 0900) SpO2:  [97 %-100 %] 99 % (01/12 0900) Weight:  [113.4 kg] 113.4 kg (01/11 1400) Physical Exam: General: NAD, resting in bed - tearful upon learning about he worsening AKI Cardiovascular: RRR Respiratory: CTAB normal WOB on RA Abdomen: soft, non-distended, moderately tender in LLQ and RLQ, stable from prior Extremities: lower extremities without significant edema  Laboratory: Most recent CBC Lab Results  Component Value Date   WBC 12.3 (H) 03/19/2022   HGB 7.2 (L) 03/19/2022   HCT 24.3 (L) 03/19/2022   MCV 96.4 03/19/2022   PLT 365 03/19/2022   Most recent BMP    Latest Ref Rng & Units 03/19/2022    4:34 AM  BMP  Glucose 70 - 99 mg/dL 299   BUN 6 - 20 mg/dL 59   Creatinine 0.44 - 1.00 mg/dL 5.93   Sodium 135 - 145 mmol/L 129   Potassium 3.5 - 5.1 mmol/L 4.7   Chloride 98 - 111 mmol/L 98   CO2 22 - 32 mmol/L 19   Calcium 8.9 - 10.3 mg/dL 7.8      Deborah Albino, MD 03/19/2022, 11:06 AM  PGY-1, [place] Intern pager: [phone], text pages welcome Secure chat group [place]

## 2022-03-19 NOTE — Consult Note (Signed)
Reason for Consult: Acute kidney injury on chronic kidney disease stage IV Referring Physician: Talbert Cage MD (FPTS)  HPI:  57 year old woman with past medical history significant for hypertension, type 2 diabetes mellitus, malignant carcinoid tumor of the duodenum s/p resection, history of atrial fibrillation, history of congestive heart failure (EF 46 to 55%), history of CVA, dyslipidemia, gastroparesis and chronic kidney disease stage IV (recent creatinine appears to be around 2.6-2.9).  She was last seen by nephrology-Dr. Candiss Norse at [facility] in November 2011 when her creatinine was around 2.2 following which she appears to have had some progression.  She was admitted to the hospital with severe diffuse abdominal pain that was stabbing in nature with associated nausea and vomiting and inability to retain oral intake for 2 days prior to admission.  She has chronic postprandial diarrhea that was unchanged in quality or quantity.  She denies any associated dysuria, urgency, frequency, hematuria, flank pain or fever/chills.  She reports to have had some transient chest tightness that apparently radiated down her left arm prior to presentation but none at this time.  She also denies any shortness of breath.  Her main complaints today surround abdominal pain.  Labs on admission yesterday were significant for an elevated creatinine of 4.3, BUN 53, potassium 5.6 and a sodium of 129 with glucose 442.  Urinalysis showed specific gravity of 1011, 0-5 RBC/hpf, 100 mg proteinuria and glucosuria.  She was on torsemide and spironolactone prior to admission.  CT abdomen/pelvis without contrast from 1/10 did not show any hydronephrosis.  Past Medical History:  Diagnosis Date   Acute back pain with sciatica, left    Acute back pain with sciatica, right    AKI (acute kidney injury) ([place])    Anemia, unspecified    Cancer (HCC)    Carcinoid tumor of duodenum    Chest pain with normal coronary  angiography 2019   Chronic kidney disease, stage 3b (HCC)    Chronic pain    Chronic systolic CHF (congestive heart failure) ([place])    Diabetes mellitus    DKA (diabetic ketoacidosis) ([place])    Drug-seeking behavior    21 hospitalizations and 14 CT a/p in 2 years for N/V and abdominal pain, demanding only IV dilaudid   Elevated troponin    chronic   Esophageal reflux    Fibromyalgia    Gastric ulcer    Gastroparesis    Gout    Hyperlipidemia    Hypertension    Hypokalemia    Hypomagnesemia    Lumbosacral stenosis    LVH (left ventricular hypertrophy)    Morbid obesity (HCC)    NICM (nonischemic cardiomyopathy) (HCC)    PAF (paroxysmal atrial fibrillation) ([place])    Stroke ([place]) 02/2011   Thrombocytosis    Vitamin B12 deficiency anemia     Past Surgical History:  Procedure Laterality Date   BIOPSY  07/27/2019   Procedure: BIOPSY;  Surgeon: Clarene Essex, MD;  Location: [facility];  Service: Endoscopy;;   BIOPSY  07/30/2019   Procedure: BIOPSY;  Surgeon: Otis Brace, MD;  Location: [facility];  Service: Gastroenterology;;   CATARACT EXTRACTION  01/2014   CHOLECYSTECTOMY     COLONOSCOPY WITH PROPOFOL N/A 07/30/2019   Procedure: COLONOSCOPY WITH PROPOFOL;  Surgeon: Otis Brace, MD;  Location: [facility];  Service: Gastroenterology;  Laterality: N/A;   ESOPHAGOGASTRODUODENOSCOPY N/A 07/27/2019   Procedure: ESOPHAGOGASTRODUODENOSCOPY (EGD);  Surgeon: Clarene Essex, MD;  Location: [facility];  Service: Endoscopy;  Laterality: N/A;   ESOPHAGOGASTRODUODENOSCOPY  N/A 07/26/2020   Procedure: ESOPHAGOGASTRODUODENOSCOPY (EGD);  Surgeon: Arta Silence, MD;  Location: [facility];  Service: Endoscopy;  Laterality: N/A;   ESOPHAGOGASTRODUODENOSCOPY (EGD) WITH PROPOFOL N/A 08/02/2019   Procedure: ESOPHAGOGASTRODUODENOSCOPY (EGD) WITH PROPOFOL;  Surgeon: Otis Brace, MD;  Location: [facility];  Service: Gastroenterology;  Laterality: N/A;   HEMOSTASIS CLIP PLACEMENT   08/02/2019   Procedure: HEMOSTASIS CLIP PLACEMENT;  Surgeon: Otis Brace, MD;  Location: [facility];  Service: Gastroenterology;;   POLYPECTOMY  07/30/2019   Procedure: POLYPECTOMY;  Surgeon: Otis Brace, MD;  Location: [facility];  Service: Gastroenterology;;   POLYPECTOMY  08/02/2019   Procedure: POLYPECTOMY;  Surgeon: Otis Brace, MD;  Location: [facility];  Service: Gastroenterology;;    Family History  Problem Relation Age of Onset   Diabetes Mother    Diabetes Father    Heart disease Father    Diabetes Sister    Congestive Heart Failure Sister 79   Diabetes Brother     Social History:  reports that she has never smoked. She has never used smokeless tobacco. She reports that she does not drink alcohol and does not use drugs.  Allergies:  Allergies  Allergen Reactions   Diazepam [name]   Gabapentin [name] and Swelling    Other reaction(s): Unknown   Iodinated Contrast Media Anaphylaxis    11/29/17 Cardiac arrest 1 min after IV contrast, possible allergy vs vasovagal episode Iopamidol  Anaphylaxis  High 11/28/2017  Patient had seizure like activity and then code post 100 cc of isovue 300     Isovue [Iopamidol] Anaphylaxis    11/28/17 Patient had seizure like activity and then 1 min code after 100 cc of isovue 300. Possible contrast allergy vs vasovagal episode   Lisinopril Anaphylaxis    Tongue and mouth swelling   Metoclopramide Other (See Comments)    Tardive dyskinesia   Nsaids Anaphylaxis and Other (See Comments)    ULCER   Penicillins Palpitations    Has patient had a PCN reaction causing immediate rash, facial/tongue/throat swelling, SOB or lightheadedness with hypotension: Yes, heart races Has patient had a PCN reaction causing severe rash involving mucus membranes or skin necrosis: No Has patient had a PCN reaction that required hospitalization: Yes  Has patient had a PCN reaction occurring within the last 10 years:  No    Dicyclomine Hcl Other (See Comments)    Reports chest pain   Tolmetin Nausea Only and Other (See Comments)    ULCER   Rifamycins Other (See Comments)    unknown   Acetaminophen Nausea Only and Other (See Comments)    Irritates stomach ulcer; Abdominal pain   Cyclobenzaprine Palpitations    Unknown   Oxycodone Palpitations   Tramadol Nausea And Vomiting    Medications: I have reviewed the patient's current medications. Scheduled:  apixaban  5 mg Oral BID   atorvastatin  10 mg Oral Daily   [START ON 03/20/2022] DULoxetine  40 mg Oral Daily   famotidine  20 mg Oral Daily   fluticasone  1 spray Each Nare Daily   folic acid  1 mg Oral Daily   HYDROmorphone  4 mg Oral BID   insulin aspart  0-15 Units Subcutaneous TID WC   insulin aspart  0-5 Units Subcutaneous QHS   insulin glargine-yfgn  50 Units Subcutaneous QHS   isosorbide mononitrate  60 mg Oral Daily   linaclotide  145 mcg Oral Daily   loratadine  10 mg Oral Daily   metoprolol succinate  50 mg Oral Daily   pantoprazole  40 mg Oral BID   sodium bicarbonate  650 mg Oral BID   Continuous:  sodium chloride Stopped (03/18/22 2017)       Latest Ref Rng & Units 03/19/2022    4:34 AM 03/18/2022    3:17 AM 03/17/2022    6:42 PM  BMP  Glucose 70 - 99 mg/dL 299  163    BUN 6 - 20 mg/dL 59  52    Creatinine 0.44 - 1.00 mg/dL 5.93  4.41    Sodium 135 - 145 mmol/L 129  136  129   Potassium 3.5 - 5.1 mmol/L 4.7  3.9  5.8   Chloride 98 - 111 mmol/L 98  101    CO2 22 - 32 mmol/L 19  21    Calcium 8.9 - 10.3 mg/dL 7.8  9.2        Latest Ref Rng & Units 03/19/2022    4:34 AM 03/18/2022    3:17 AM 03/17/2022    6:42 PM  CBC  WBC 4.0 - 10.5 K/uL 12.3  14.3    Hemoglobin 12.0 - 15.0 g/dL 7.2  8.4  8.8   Hematocrit 36.0 - 46.0 % 24.3  27.1  26.0   Platelets 150 - 400 K/uL 365  445       CT ABDOMEN PELVIS WO CONTRAST  Result Date: 03/17/2022 CLINICAL DATA:  Abdominal pain, acute, nonlocalized, nausea, vomiting. EXAM: CT  ABDOMEN AND PELVIS WITHOUT CONTRAST TECHNIQUE: Multidetector CT imaging of the abdomen and pelvis was performed following the standard protocol without IV contrast. RADIATION DOSE REDUCTION: This exam was performed according to the departmental dose-optimization program which includes automated exposure control, adjustment of the mA and/or kV according to patient size and/or use of iterative reconstruction technique. COMPARISON:  09/10/2021 FINDINGS: Lower chest: No acute abnormality. Hepatobiliary: Stable mild hepatomegaly. No focal intrahepatic mass on this noncontrast examination. No intra or extrahepatic biliary ductal dilation. Status post cholecystectomy. Pancreas: Unremarkable Spleen: Unremarkable Adrenals/Urinary Tract: Adrenal glands are unremarkable. Kidneys are normal, without renal calculi, focal lesion, or hydronephrosis. Bladder is unremarkable. Stomach/Bowel: Stomach is within normal limits. Appendix appears normal. No evidence of bowel wall thickening, distention, or inflammatory changes. Vascular/Lymphatic: Minimal atherosclerotic calcification within the abdominal aorta. No aortic aneurysm. No pathologic adenopathy within the abdomen and pelvis. Reproductive: 4.8 cm hyperdense mass abuts the uterine fundus extending into the left adnexa and is previously characterized on MRI examination of 11/29/2017 as an exophytic uterine fibroid. This is stable. The pelvic organs are otherwise unremarkable. Other: No abdominal wall hernia or abnormality. No abdominopelvic ascites. Musculoskeletal: Degenerative changes are seen within the lumbar spine. No lytic or blastic bone lesion. No acute bone abnormality. IMPRESSION: 1. No acute intra-abdominal pathology identified. No definite radiographic explanation for the patient's reported symptoms. 2. Stable mild hepatomegaly. 3. Stable 4.8 cm exophytic uterine fibroid. Aortic Atherosclerosis (ICD10-I70.0). Electronically Signed   By: Fidela Salisbury M.D.   On:  03/17/2022 19:50   DG Chest 2 View  Result Date: 03/17/2022 CLINICAL DATA:  Chest pain EXAM: CHEST - 2 VIEW COMPARISON:  09/10/2021 and older. FINDINGS: Under penetrated radiographs. Underinflation. No consolidation, pneumothorax or effusion. Normal cardiopericardial silhouette without edema. Frontal views are also somewhat lordotic. Degenerative changes of the spine. IMPRESSION: No acute cardiopulmonary disease. Electronically Signed   By: Jill Side M.D.   On: 03/17/2022 17:22    Review of Systems  Constitutional:  Positive for appetite change and fatigue. Negative for chills  and fever.  HENT:  Negative for nosebleeds, sinus pain and trouble swallowing.   Eyes:  Negative for photophobia and visual disturbance.  Respiratory:  Positive for chest tightness. Negative for cough and shortness of breath.   Cardiovascular:  Positive for chest pain and palpitations. Negative for leg swelling.  Gastrointestinal:  Positive for abdominal pain, diarrhea, nausea and vomiting. Negative for blood in stool.  Genitourinary:  Negative for difficulty urinating, dysuria, hematuria and urgency.  Musculoskeletal:  Positive for back pain and myalgias.  Skin:  Negative for rash and wound.  Neurological:  Positive for weakness. Negative for dizziness, light-headedness and headaches.   Blood pressure 124/86, pulse 86, temperature (!) 97.3 F (36.3 C), temperature source Oral, resp. rate 18, weight 113.4 kg, last menstrual period 10/10/2012, SpO2 99 %. Physical Exam Vitals reviewed.  Constitutional:      Appearance: She is well-developed. She is obese. She is ill-appearing.     Comments: Sitting on the edge of her bed with head resting on rolling table  HENT:     Head: Normocephalic and atraumatic.     Mouth/Throat:     Mouth: Mucous membranes are moist.     Pharynx: Oropharynx is clear.  Eyes:     Pupils: Pupils are equal, round, and reactive to light.  Cardiovascular:     Rate and Rhythm: Normal rate and  regular rhythm.     Heart sounds: Normal heart sounds. No murmur heard. Pulmonary:     Effort: Pulmonary effort is normal.     Breath sounds: Normal breath sounds. No wheezing or rales.  Abdominal:     General: Abdomen is protuberant. Bowel sounds are normal. There is no distension.     Palpations: Abdomen is soft.     Tenderness: There is generalized abdominal tenderness. There is guarding.  Musculoskeletal:     Right lower leg: No edema.     Left lower leg: No edema.  Skin:    General: Skin is warm and dry.  Neurological:     General: No focal deficit present.     Mental Status: She is alert and oriented to person, place, and time.     Assessment/Plan: 1.  Acute kidney injury on chronic kidney disease stage IV: I agree with the assessment of the primary service that she has likely had progression of her underlying chronic kidney disease with a higher baseline creatinine however, suspect that this is more around 2.9 than the current value.  Her acute injury is most likely consistent with prerenal azotemia and it is plausible that she has mild ATN from sustained prerenal state reflected in the lack of rapid improvement with intravenous fluids overnight.  Her urine output is not charted from overnight and I discussed with her the importance that we do this to assess for renal recovery.  Ejection fraction is rather preserved and I will give her additional intravenous fluids overnight for additional volume expansion and recheck labs again tomorrow morning.  She does not have any acute indications for dialysis at this time. Avoid nephrotoxic medications including NSAIDs and iodinated intravenous contrast exposure unless the latter is absolutely indicated.  Preferred narcotic agents for pain control are hydromorphone, fentanyl, and methadone. Morphine should not be used. Avoid Baclofen and avoid oral sodium phosphate and magnesium citrate based laxatives / bowel preps. Continue strict Input and  Output monitoring. Will monitor the patient closely with you and intervene or adjust therapy as indicated by changes in clinical status/labs.  2.  Hyponatremia: Likely  secondary to impaired free water excretion and hypotonic fluid intake in the setting of acute kidney injury compounded by hyperglycemia and pseudohyponatremia.  Monitor with isotonic fluids. 3.  Anion gap metabolic acidosis: Secondary to acute kidney injury, monitor with isotonic fluids. 4.  Anemia: Likely secondary to chronic illness including chronic kidney disease.  Will check iron studies and replete.  Would be prudent to clarify the safety of using ESA with oncology prior to starting this (she informs me that she has been getting annual follow-up visits at [facility] status post resection of her carcinoid tumor without recurrence). 5.  Abdominal pain: Appears to be associated with diabetic gastroparesis and ongoing pain management per primary service; she is intolerant to prokinetic therapy  Dornell Grasmick K. 03/19/2022, 11:50 AM

## 2022-03-20 DIAGNOSIS — N179 Acute kidney failure, unspecified: Secondary | ICD-10-CM | POA: Diagnosis not present

## 2022-03-20 DIAGNOSIS — N189 Chronic kidney disease, unspecified: Secondary | ICD-10-CM | POA: Diagnosis not present

## 2022-03-20 LAB — RENAL FUNCTION PANEL
Albumin: 2.9 g/dL — ABNORMAL LOW (ref 3.5–5.0)
Anion gap: 11 (ref 5–15)
BUN: 60 mg/dL — ABNORMAL HIGH (ref 6–20)
CO2: 21 mmol/L — ABNORMAL LOW (ref 22–32)
Calcium: 8.3 mg/dL — ABNORMAL LOW (ref 8.9–10.3)
Chloride: 101 mmol/L (ref 98–111)
Creatinine, Ser: 5.07 mg/dL — ABNORMAL HIGH (ref 0.44–1.00)
GFR, Estimated: 9 mL/min — ABNORMAL LOW (ref >60)
Glucose, Bld: 305 mg/dL — ABNORMAL HIGH (ref 70–99)
Phosphorus: 4.9 mg/dL — ABNORMAL HIGH (ref 2.5–4.6)
Potassium: 4.5 mmol/L (ref 3.5–5.1)
Sodium: 133 mmol/L — ABNORMAL LOW (ref 135–145)

## 2022-03-20 LAB — GLUCOSE, CAPILLARY
Glucose-Capillary: 237 mg/dL — ABNORMAL HIGH (ref 70–99)
Glucose-Capillary: 260 mg/dL — ABNORMAL HIGH (ref 70–99)
Glucose-Capillary: 288 mg/dL — ABNORMAL HIGH (ref 70–99)
Glucose-Capillary: 283 mg/dL — ABNORMAL HIGH (ref 70–99)

## 2022-03-20 LAB — MAGNESIUM: Magnesium: 1.4 mg/dL — ABNORMAL LOW (ref 1.7–2.4)

## 2022-03-20 MED ORDER — SODIUM CHLORIDE 0.9 % IV SOLN
250.0000 mg | Freq: Every day | INTRAVENOUS | Status: DC
  Administered 2022-03-20 – 2022-03-21 (×2): 250 mg via INTRAVENOUS
  Filled 2022-03-20 (×4): qty 20

## 2022-03-20 MED ORDER — MAGNESIUM SULFATE 2 GM/50ML IV SOLN
2.0000 g | Freq: Once | INTRAVENOUS | Status: AC
  Administered 2022-03-20: 2 g via INTRAVENOUS
  Filled 2022-03-20: qty 50

## 2022-03-20 MED ORDER — HYDROMORPHONE HCL 1 MG/ML IJ SOLN
0.5000 mg | Freq: Three times a day (TID) | INTRAMUSCULAR | Status: DC | PRN
  Administered 2022-03-20 – 2022-03-21 (×3): 0.5 mg via INTRAVENOUS
  Filled 2022-03-20 (×4): qty 0.5

## 2022-03-20 MED ORDER — INSULIN ASPART 100 UNIT/ML IJ SOLN
0.0000 [IU] | Freq: Three times a day (TID) | INTRAMUSCULAR | Status: DC
  Administered 2022-03-20 – 2022-03-21 (×2): 11 [IU] via SUBCUTANEOUS
  Administered 2022-03-21: 15 [IU] via SUBCUTANEOUS
  Administered 2022-03-21: 7 [IU] via SUBCUTANEOUS
  Administered 2022-03-22: 11 [IU] via SUBCUTANEOUS
  Administered 2022-03-22: 3 [IU] via SUBCUTANEOUS

## 2022-03-20 MED ORDER — INSULIN ASPART 100 UNIT/ML IJ SOLN: 0.0000 [IU] | Freq: Three times a day (TID) | INTRAMUSCULAR | Status: DC

## 2022-03-20 NOTE — Progress Notes (Signed)
Patient ID: Deborah [name], female   DOB: September 30, 1965, 57 y.o.   MRN: [id number] [name] Progress Note   Assessment/ Plan:   1.  Acute kidney injury on chronic kidney disease stage IV: With multiple previous episodes of prerenal acute kidney injury and underlying chronic kidney disease likely with progression; recent baseline creatinine suspected to be around 2.9 (from 2.2 back in 2022).  Presentation consistent with recurrent prerenal azotemia with possible mild ATN based on initial poor response to intravenous fluids.  She has been (based on her description) nonoliguric overnight and with improvement of renal function seen on labs.  She does not have any acute indications for dialysis and the plan is to continue gentle intravenous volume expansion given improvement seen so far and no evidence of volume overload.  2.  Hyponatremia: Likely secondary to impaired free water excretion and hypotonic fluid intake in the setting of acute kidney injury compounded by hyperglycemia and pseudohyponatremia.  Improving with isotonic fluid. 3.  Anion gap metabolic acidosis: Secondary to acute kidney injury, improving with fluids and ongoing sodium bicarbonate supplementation. 4.  Anemia: Likely secondary to chronic illness including chronic kidney disease.  Iron panel from yesterday shows low iron saturation and I will order for intravenous iron series. 5.  Abdominal pain: Appears to be associated with diabetic gastroparesis and ongoing pain management per primary service; she is intolerant to prokinetic therapy  Subjective:   Reports continued problems with abdominal pain overnight.  Denies any shortness of breath or chest pain.   Objective:   BP 126/64 (BP Location: Right Arm)   Pulse 98   Temp (!) 97.5 F (36.4 C) (Oral)   Resp 19   Ht '5\' 6"'$  (1.676 m)   Wt 115.2 kg   LMP 10/10/2012   SpO2 100%   BMI 40.99 kg/m   Intake/Output Summary (Last 24 hours) at 03/20/2022 0735 Last data  filed at 03/20/2022 8250 Gross per 24 hour  Intake 584.75 ml  Output --  Net 584.75 ml   Weight change: -0.399 kg  Physical Exam: Gen: Resting comfortably in bed, awakens to calling out her name CVS: Pulse regular rhythm, normal rate, S1 and S2 normal Resp: Diminished breath sounds over bases, no rales/rhonchi Abd: Soft, obese, mild tenderness over upper quadrants Ext: No lower extremity edema  Imaging: US RENAL  Result Date: 03/19/2022 CLINICAL DATA:  Chronic kidney disease EXAM: RENAL / URINARY TRACT ULTRASOUND COMPLETE COMPARISON:  CT 03/17/2022 FINDINGS: Right Kidney: Renal measurements: 10.4 x 4.8 x 5.3 cm = volume: 137.9 mL. Echogenicity within normal limits. No mass or hydronephrosis visualized. Left Kidney: Renal measurements: 10.7 x 6.1 x 4.8 cm = volume: 164.1 mL. Echogenicity within normal limits. No mass or hydronephrosis visualized. Bladder: Appears normal for degree of bladder distention. Other: None. IMPRESSION: Negative examination. Electronically Signed   By: Donavan Foil M.D.   On: 03/19/2022 22:24    Labs: BMET Recent Labs  Lab 03/17/22 1654 03/17/22 1842 03/18/22 0317 03/19/22 0434 03/20/22 0228  NA 129* 129* 136 129* 133*  K 5.6* 5.8* 3.9 4.7 4.5  CL 95*  --  101 98 101  CO2 19*  --  21* 19* 21*  GLUCOSE 442*  --  163* 299* 305*  BUN 53*  --  52* 59* 60*  CREATININE 4.27*  --  4.41* 5.93* 5.07*  CALCIUM 9.0  --  9.2 7.8* 8.3*  PHOS  --   --   --   --  4.9*  CBC Recent Labs  Lab 03/17/22 1654 03/17/22 1842 03/18/22 0317 03/19/22 0434  WBC 14.6*  --  14.3* 12.3*  NEUTROABS 11.6*  --   --   --   HGB 8.5* 8.8* 8.4* 7.2*  HCT 26.6* 26.0* 27.1* 24.3*  MCV 93.3  --  93.1 96.4  PLT 464*  --  445* 365    Medications:     apixaban  5 mg Oral BID   atorvastatin  10 mg Oral Daily   DULoxetine  40 mg Oral Daily   famotidine  20 mg Oral Daily   fluticasone  1 spray Each Nare Daily   folic acid  1 mg Oral Daily   HYDROmorphone  4 mg Oral BID    insulin aspart  0-15 Units Subcutaneous TID WC   insulin aspart  0-5 Units Subcutaneous QHS   insulin glargine-yfgn  50 Units Subcutaneous QHS   isosorbide mononitrate  60 mg Oral Daily   linaclotide  145 mcg Oral Daily   loratadine  10 mg Oral Daily   metoprolol succinate  50 mg Oral Daily   pantoprazole  40 mg Oral BID   sodium bicarbonate  650 mg Oral BID   Elmarie Shiley, MD 03/20/2022, 7:35 AM

## 2022-03-20 NOTE — Progress Notes (Signed)
Daily Progress Note Intern Pager: [phone]  Patient name: Deborah [name] Medical record number: [id number] Date of birth: 26-Dec-1965 Age: 57 y.o. Gender: female  Primary Care Provider: Cena Benton, MD Consultants: Nephrology Code Status: Full  Pt Overview and Major Events to Date:  1/10 admitted  Assessment and Plan:  Deborah [name] is a 57 y.o. female p/w diffuse abdominal pain in the setting of chronic diabetic gastroparesis, also noted to have acute on chronic kidney disease. Nephrology consulted due to worsening of her CKD.    Pertinent PMH/PSH includes T2DM, diabetic gastroparesis, malignant carcinoid tumor of the duodenum, PAF, CKD, HFpEF (EF 50-55%), HTN.  * Acute kidney injury superimposed on chronic kidney disease ([place]) Showing signs of improvement (Cr 5.93>5.07) on IV fluids. Renal U/S unremarkable. Nonoliguric. -Nephrology following, appreciate their care and recommendations -f/u urine lytes -Continue NS IVF at 162m/hr, cautious due to CHF -Hold home torsemide, spironolactone - AM RFP, Mg -Strict I/O  Diabetic gastroparesis (HCC) Pain improving. On home Dilaudid with prn IV for breakthrough. However, suspect that chronic dilaudid use is possibly also contributing. - home Dilaudid, prn IV Dilaudid q8h for breakthrough pain - Tx hyperglycemia/T2DM, AKI - Cont home Linzess  - Phenergan q6h prn for nausea/vomiting - Per chart review, pt has adverse reaction to metoclopramide, erythromycin, and Bentyl.  - likely needs outpatient GI follow up - Vitals per floor - AM CBC, BMP  Type 2 diabetes mellitus with hyperglycemia, with long-term current use of insulin (HCC) Stable. Home regimen: Lantus 50 qhs, Lispro 30 QID - Semglee 50u qhs (for basal) - modSSI for mealtime, add mealtime coverage if needed - monitor CBG  Tachycardia Hx PAF. BP stable currently but has required fluid boluses. On home metoprolol. Her pain and recent N/V likely  contributing. -Treat other conditions as above -Continue home metop  HFrEF (heart failure with reduced [place]) [place] Most recent echo 09/2021 with EF 50-55%. Holding home torsemide due to AKI (dose was recently increased from '40mg'$  qd to '50mg'$  BID). Euvolemic on exam. -Daily weights, strict I/O -Consider repeat echo if worsening fluid/respiratory status -Caution with fluids as noted -Consider decreasing torsemide dose upon discharge   Chronic pain Chronically on home Dilaudid '4mg'$  BID, follows with PMR. 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 Noted to have chronic back pain related to spinal stenosis. Suspect that this and her gastroparesis are contributing to her overall pain.  -Pain control as above -Dilaudid likely contributing to gastroparesis, will space out PRNs to transition back to home dose. Will need continued f/u outpatient   Essential hypertension, benign Stable. Had brief hypotensive episodes requiring IV bolus. - Cont home metoprolol - Holding home hydralazine to avoid hypotension - hold spiro, torsemide given AKI. Treating AKI/CKD as above       FEN/GI: carb modified PPx: home eliquis Dispo:pending continued management. Requiring IV fluids  Subjective:  Feeling slightly better today but yesterday had a lot of pain. Has not vomited again today.   Discussed at length about spacing out her prn Dilaudid from q6h to q8h. She initially did not respond well to this but after discussing longer she is more on board. Discussed that we are not changing her medication because of any suspicion for abuse; rather, we are doing so to hopefully transition her back to her home dose. Discussed that Dilaudid may have unintended/unnoticed long term consequences related to her abdominal pain.   Objective: Temp:  [97.3 F (  36.3 C)-97.5 F (36.4 C)] 97.5 F (36.4 C) (01/13 0448) Pulse Rate:  [86-100] 91 (01/13 0753) Resp:  [18-19] 19  (01/13 0448) BP: (106-151)/(60-100) 106/60 (01/13 0753) SpO2:  [96 %-100 %] 99 % (01/13 0753) Weight:  [831 kg-115.2 kg] 115.2 kg (01/13 0448) Physical Exam: General: NAD, sitting in chair Cardiovascular: RRR Respiratory: CTAB on RA Abdomen: soft, moderately tender in LLQ and RLQ, stable from prior Extremities: no significant edema  Laboratory: Most recent CBC Lab Results  Component Value Date   WBC 12.3 (H) 03/19/2022   HGB 7.2 (L) 03/19/2022   HCT 24.3 (L) 03/19/2022   MCV 96.4 03/19/2022   PLT 365 03/19/2022   Most recent BMP    Latest Ref Rng & Units 03/20/2022    2:28 AM  BMP  Glucose 70 - 99 mg/dL 305   BUN 6 - 20 mg/dL 60   Creatinine 0.44 - 1.00 mg/dL 5.07   Sodium 135 - 145 mmol/L 133   Potassium 3.5 - 5.1 mmol/L 4.5   Chloride 98 - 111 mmol/L 101   CO2 22 - 32 mmol/L 21   Calcium 8.9 - 10.3 mg/dL 8.3     Mg 1.4 (repleted)  Imaging/Diagnostic Tests:  Renal U/S: Negative  August Albino, MD 03/20/2022, 12:32 PM  PGY-1, [place] Intern pager: [phone], text pages welcome Secure chat group [email or web address]

## 2022-03-21 DIAGNOSIS — N189 Chronic kidney disease, unspecified: Secondary | ICD-10-CM | POA: Diagnosis not present

## 2022-03-21 DIAGNOSIS — R600 Localized edema: Secondary | ICD-10-CM | POA: Insufficient documentation

## 2022-03-21 DIAGNOSIS — N179 Acute kidney failure, unspecified: Secondary | ICD-10-CM | POA: Diagnosis not present

## 2022-03-21 LAB — CBC
HCT: 24.3 % — ABNORMAL LOW (ref 36.0–46.0)
Hemoglobin: 7.4 g/dL — ABNORMAL LOW (ref 12.0–15.0)
MCH: 28.9 pg (ref 26.0–34.0)
MCHC: 30.5 g/dL (ref 30.0–36.0)
MCV: 94.9 fL (ref 80.0–100.0)
Platelets: 328 10*3/uL (ref 150–400)
RBC: 2.56 MIL/uL — ABNORMAL LOW (ref 3.87–5.11)
RDW: 13 % (ref 11.5–15.5)
WBC: 11.9 10*3/uL — ABNORMAL HIGH (ref 4.0–10.5)
nRBC: 0 % (ref 0.0–0.2)

## 2022-03-21 LAB — RENAL FUNCTION PANEL
Albumin: 2.9 g/dL — ABNORMAL LOW (ref 3.5–5.0)
Anion gap: 10 (ref 5–15)
BUN: 60 mg/dL — ABNORMAL HIGH (ref 6–20)
CO2: 18 mmol/L — ABNORMAL LOW (ref 22–32)
Calcium: 8.2 mg/dL — ABNORMAL LOW (ref 8.9–10.3)
Chloride: 102 mmol/L (ref 98–111)
Creatinine, Ser: 3.52 mg/dL — ABNORMAL HIGH (ref 0.44–1.00)
GFR, Estimated: 14 mL/min — ABNORMAL LOW (ref >60)
Glucose, Bld: 310 mg/dL — ABNORMAL HIGH (ref 70–99)
Phosphorus: 3.8 mg/dL (ref 2.5–4.6)
Potassium: 4.7 mmol/L (ref 3.5–5.1)
Sodium: 130 mmol/L — ABNORMAL LOW (ref 135–145)

## 2022-03-21 LAB — HEMOGLOBIN A1C
Hgb A1c MFr Bld: 10.1 % — ABNORMAL HIGH (ref 4.8–5.6)
Mean Plasma Glucose: 243.17 mg/dL

## 2022-03-21 LAB — GLUCOSE, CAPILLARY
Glucose-Capillary: 209 mg/dL — ABNORMAL HIGH (ref 70–99)
Glucose-Capillary: 267 mg/dL — ABNORMAL HIGH (ref 70–99)
Glucose-Capillary: 343 mg/dL — ABNORMAL HIGH (ref 70–99)

## 2022-03-21 LAB — MAGNESIUM: Magnesium: 1.8 mg/dL (ref 1.7–2.4)

## 2022-03-21 MED ORDER — LIDOCAINE 5 % EX PTCH: 1.0000 | MEDICATED_PATCH | CUTANEOUS | Status: DC

## 2022-03-21 MED ORDER — INSULIN GLARGINE-YFGN 100 UNIT/ML ~~LOC~~ SOLN
60.0000 [IU] | Freq: Every day | SUBCUTANEOUS | Status: DC
  Filled 2022-03-21 (×2): qty 0.6

## 2022-03-21 MED ORDER — FUROSEMIDE 10 MG/ML IJ SOLN
40.0000 mg | Freq: Once | INTRAMUSCULAR | Status: AC
  Administered 2022-03-21: 40 mg via INTRAVENOUS
  Filled 2022-03-21: qty 4

## 2022-03-21 NOTE — Progress Notes (Signed)
Daily Progress Note Intern Pager: [phone]  Patient name: Deborah [name] Medical record number: [id number] Date of birth: [birth year] Age: 57 y.o. Gender: female  Primary Care Provider: Cena Benton, MD Consultants: Nephrology Code Status: Full  Pt Overview and Major Events to Date:  1/10-admitted  Assessment and Plan: Deborah [name] is a 57 y.o. female p/w diffuse abdominal pain in the setting of chronic diabetic gastroparesis, also noted to have acute on chronic kidney disease. Nephrology consulted due to worsening of her CKD.    Pertinent PMH/PSH includes T2DM, diabetic gastroparesis, malignant carcinoid tumor of the duodenum, PAF, CKD, HFpEF (EF 50-55%), HTN.  * Acute kidney injury superimposed on chronic kidney disease ([place]) Cr improving 5.07>3.52 after receiving IV fluids.  Renal U/S unremarkable.  Nephrology following, patient appears volume overloaded. -Nephrology following, appreciate their care and recommendations -D/C IV fluids -1 dose IV Lasix 40 mg per nephrology -Hold home torsemide, spironolactone - AM RFP, Mg -Strict I/O  Diabetic gastroparesis ([place]) Still in pain, still nauseous, not vomiting. On home Dilaudid and has been getting IV Dilaudid as needed.  Suspect that chronic dilaudid use is possibly also contributing to gastroparesis. -Continue home Dilaudid, DC IV Dilaudid - Tx hyperglycemia/T2DM, AKI - Cont home Linzess  - Phenergan q6h prn for nausea/vomiting - Per chart review, pt has adverse reaction to metoclopramide, erythromycin, and Bentyl.  - likely needs outpatient GI follow up - Vitals per floor   Type 2 diabetes mellitus with hyperglycemia, with long-term current use of insulin (HCC) Home regimen: Lantus 50 qhs, Lispro 30 QID. Received semglee 50 U and 30 aspart yesterday - Increase Semglee 60u qhs - modSSI for mealtime, add mealtime coverage if needed - monitor CBG  Edema of left upper extremity Likely  multifactorial, can consider thrombophlebitis with fluid overload contributing. -Keep elevated -CTM   Tachycardia Hx PAF. HR stable -Treat other conditions as above -Continue home metop  HFrEF (heart failure with reduced ejection fraction) ([place]) Most recent echo 09/2021 with EF 50-55%. Holding home torsemide due to AKI (dose was recently increased from '40mg'$  qd to '50mg'$  BID). Euvolemic on exam. -Daily weights, strict I/O -Consider repeat echo if worsening fluid/respiratory status -Caution with fluids as noted -Consider decreasing torsemide dose upon discharge   Chronic pain Chronically on home Dilaudid '4mg'$  BID, follows with PMR. 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 Noted to have chronic back pain related to spinal stenosis. Suspect that this and her gastroparesis are contributing to her overall pain.  -Continue home Dilaudid -Consider scheduled Tylenol if needed   Essential hypertension, benign Stable. Had brief hypotensive episodes requiring IV bolus. - Cont home metoprolol - Holding home hydralazine to avoid hypotension - hold spiro, torsemide given AKI. Treating AKI/CKD as above     FEN/GI: Carb modified PPx: Eliquis Dispo: Pending continued medical management, may discharge home tomorrow  Subjective:  Patient complains of back pain, abdominal pain, new swelling and pain of left upper extremity since last night.  States she is feeling nauseous but was able to eat eggs toast and bacon for breakfast.  Objective: Temp:  [97.4 F (36.3 C)-98.7 F (37.1 C)] 98.2 F (36.8 C) (01/14 0740) Pulse Rate:  [83-93] 93 (01/14 0740) Resp:  [16-18] 16 (01/14 0740) BP: (121-128)/(54-74) 121/54 (01/14 0740) SpO2:  [100 %] 100 % (01/14 0740) Physical Exam: General: 57 year old female, sitting up in chair, NAD Cardiovascular: Well-perfused Respiratory: Breathing comfortably on room air Abdomen: Soft, nondistended,  diffuse pain with  palpation, no guarding Extremities: Left hand and wrist edematous, pain with movement of LUE, decreased grip strength of left hand due to pain  Laboratory: Most recent CBC Lab Results  Component Value Date   WBC 11.9 (H) 03/21/2022   HGB 7.4 (L) 03/21/2022   HCT 24.3 (L) 03/21/2022   MCV 94.9 03/21/2022   PLT 328 03/21/2022   Most recent BMP    Latest Ref Rng & Units 03/21/2022    3:12 AM  BMP  Glucose 70 - 99 mg/dL 310   BUN 6 - 20 mg/dL 60   Creatinine 0.44 - 1.00 mg/dL 3.52   Sodium 135 - 145 mmol/L 130   Potassium 3.5 - 5.1 mmol/L 4.7   Chloride 98 - 111 mmol/L 102   CO2 22 - 32 mmol/L 18   Calcium 8.9 - 10.3 mg/dL 8.2      Precious Gilding, DO 03/21/2022, 12:30 PM  PGY-2, [place] Intern pager: [phone], text pages welcome Secure chat group [place]

## 2022-03-21 NOTE — Progress Notes (Signed)
Patient ID: Deborah [name], female   DOB: December 14, 1965, 57 y.o.   MRN: [id number] [facility] Progress Note   Assessment/ Plan:   1.  Acute kidney injury on chronic kidney disease stage IV: With multiple previous episodes of prerenal acute kidney injury and underlying chronic kidney disease likely with progression; recent baseline creatinine suspected to be around 2.9 (from 2.2 back in 2022).  Presentation consistent with recurrent prerenal azotemia with possible mild ATN based on initial poor response to intravenous fluids.  Urine output is not charted but she reports to have been urinating well by ambulating to the bathroom/bedside commode.  Labs this morning show improving renal function and physical exam shows evidence of volume overload.  Will discontinue IV fluids at this time and give a single dose of IV Lasix to help promote diuresis/urine output while monitoring renal function.  I will arrange for outpatient nephrology follow-up with Dr. Candiss Norse at [facility] tomorrow when the office is open. 2.  Hyponatremia: Likely secondary to impaired free water excretion and hypotonic fluid intake in the setting of acute kidney injury compounded by hyperglycemia and pseudohyponatremia.  Slightly worse this morning-monitor with diuresis. 3.  Anion gap metabolic acidosis: Secondary to acute kidney injury, improving with fluids and ongoing sodium bicarbonate supplementation. 4.  Anemia: Likely secondary to chronic illness including chronic kidney disease.  Iron panel from yesterday shows low iron saturation and I will order for intravenous iron series. 5.  Abdominal pain: Appears to be associated with diabetic gastroparesis and ongoing pain management per primary service; she is intolerant to prokinetic therapy  Subjective:   Reports sensation of left arm swelling with some numbness (ipsilateral to antecubital IV access) as well as bilateral leg swelling.  Denies any chest pain/chest  tightness or shortness of breath   Objective:   BP 123/72 (BP Location: Right Arm)   Pulse 83   Temp 98.7 F (37.1 C)   Resp 16   Ht '5\' 6"'$  (1.676 m)   Wt 115.2 kg   LMP 10/10/2012   SpO2 100%   BMI 40.99 kg/m   Intake/Output Summary (Last 24 hours) at 03/21/2022 0735 Last data filed at 03/21/2022 0657 Gross per 24 hour  Intake 1949.02 ml  Output --  Net 1949.02 ml   Weight change:   Physical Exam: Gen: Appears comfortable resting in bed laying on her left side. CVS: Pulse regular rhythm, normal rate, S1 and S2 normal Resp: Diminished breath sounds over bases, no rales/rhonchi Abd: Soft, obese, mild tenderness over upper quadrants Ext: 1+ bilateral lower extremity edema  Imaging: US RENAL  Result Date: 03/19/2022 CLINICAL DATA:  Chronic kidney disease EXAM: RENAL / URINARY TRACT ULTRASOUND COMPLETE COMPARISON:  CT 03/17/2022 FINDINGS: Right Kidney: Renal measurements: 10.4 x 4.8 x 5.3 cm = volume: 137.9 mL. Echogenicity within normal limits. No mass or hydronephrosis visualized. Left Kidney: Renal measurements: 10.7 x 6.1 x 4.8 cm = volume: 164.1 mL. Echogenicity within normal limits. No mass or hydronephrosis visualized. Bladder: Appears normal for degree of bladder distention. Other: None. IMPRESSION: Negative examination. Electronically Signed   By: Donavan Foil M.D.   On: 03/19/2022 22:24    Labs: BMET Recent Labs  Lab 03/17/22 1654 03/17/22 1842 03/18/22 0317 03/19/22 0434 03/20/22 0228 03/21/22 0312  NA 129* 129* 136 129* 133* 130*  K 5.6* 5.8* 3.9 4.7 4.5 4.7  CL 95*  --  101 98 101 102  CO2 19*  --  21* 19* 21* 18*  GLUCOSE 442*  --  163* 299* 305* 310*  BUN 53*  --  52* 59* 60* 60*  CREATININE 4.27*  --  4.41* 5.93* 5.07* 3.52*  CALCIUM 9.0  --  9.2 7.8* 8.3* 8.2*  PHOS  --   --   --   --  4.9* 3.8   CBC Recent Labs  Lab 03/17/22 1654 03/17/22 1842 03/18/22 0317 03/19/22 0434 03/21/22 0312  WBC 14.6*  --  14.3* 12.3* 11.9*  NEUTROABS 11.6*  --    --   --   --   HGB 8.5* 8.8* 8.4* 7.2* 7.4*  HCT 26.6* 26.0* 27.1* 24.3* 24.3*  MCV 93.3  --  93.1 96.4 94.9  PLT 464*  --  445* 365 328    Medications:     apixaban  5 mg Oral BID   atorvastatin  10 mg Oral Daily   DULoxetine  40 mg Oral Daily   famotidine  20 mg Oral Daily   fluticasone  1 spray Each Nare Daily   folic acid  1 mg Oral Daily   HYDROmorphone  4 mg Oral BID   insulin aspart  0-20 Units Subcutaneous TID WC   insulin aspart  0-5 Units Subcutaneous QHS   insulin glargine-yfgn  50 Units Subcutaneous QHS   isosorbide mononitrate  60 mg Oral Daily   linaclotide  145 mcg Oral Daily   loratadine  10 mg Oral Daily   metoprolol succinate  50 mg Oral Daily   pantoprazole  40 mg Oral BID   sodium bicarbonate  650 mg Oral BID   Elmarie Shiley, MD 03/21/2022, 7:35 AM

## 2022-03-21 NOTE — Assessment & Plan Note (Signed)
Likely multifactorial, can consider thrombophlebitis with fluid overload contributing. -Keep elevated -CTM

## 2022-03-21 NOTE — Progress Notes (Signed)
Patient asked about IV dilaudid, I explained that IV dilaudid was d/c and she has po dilaudid scheduled BID, next dose is 0800. She said no one told her. I told her she has '650mg'$  tylenol available. She stated she is allergic. Patient stated, to leave her pm medications on table and would take them later. I explained I could not leave medications and she refused to take  pm medications. Noted on MAR and medications were discarded. Will contact on call about possible options for pain medications. Sherlyn Hay, LPN

## 2022-03-21 NOTE — Progress Notes (Signed)
As per md offered pt alternatives for pain, ie heating pad, lidocaine patch, pt  has refused those options as well as glucose checks and insulin administration, Md notified. Sherlyn Hay, LPN

## 2022-03-22 DIAGNOSIS — N179 Acute kidney failure, unspecified: Secondary | ICD-10-CM

## 2022-03-22 DIAGNOSIS — N189 Chronic kidney disease, unspecified: Secondary | ICD-10-CM

## 2022-03-22 LAB — RENAL FUNCTION PANEL
Albumin: 2.7 g/dL — ABNORMAL LOW (ref 3.5–5.0)
Anion gap: 10 (ref 5–15)
BUN: 58 mg/dL — ABNORMAL HIGH (ref 6–20)
CO2: 18 mmol/L — ABNORMAL LOW (ref 22–32)
Calcium: 8.7 mg/dL — ABNORMAL LOW (ref 8.9–10.3)
Chloride: 107 mmol/L (ref 98–111)
Creatinine, Ser: 3.02 mg/dL — ABNORMAL HIGH (ref 0.44–1.00)
GFR, Estimated: 17 mL/min — ABNORMAL LOW (ref >60)
Glucose, Bld: 176 mg/dL — ABNORMAL HIGH (ref 70–99)
Phosphorus: 3.5 mg/dL (ref 2.5–4.6)
Potassium: 4.3 mmol/L (ref 3.5–5.1)
Sodium: 135 mmol/L (ref 135–145)

## 2022-03-22 LAB — GLUCOSE, CAPILLARY
Glucose-Capillary: 145 mg/dL — ABNORMAL HIGH (ref 70–99)
Glucose-Capillary: 255 mg/dL — ABNORMAL HIGH (ref 70–99)

## 2022-03-22 LAB — MAGNESIUM: Magnesium: 1.7 mg/dL (ref 1.7–2.4)

## 2022-03-22 MED ORDER — HYDRALAZINE HCL 25 MG PO TABS: 25.0000 mg | ORAL_TABLET | Freq: Three times a day (TID) | ORAL | 0 refills | Status: DC

## 2022-03-22 MED ORDER — ISOSORBIDE MONONITRATE ER 30 MG PO TB24: 15.0000 mg | ORAL_TABLET | Freq: Every day | ORAL | 0 refills | Status: DC

## 2022-03-22 MED ORDER — LINACLOTIDE 145 MCG PO CAPS: 145.0000 ug | ORAL_CAPSULE | Freq: Every day | ORAL | 0 refills | Status: DC

## 2022-03-22 MED ORDER — DULOXETINE HCL 40 MG PO CPEP: 40.0000 mg | ORAL_CAPSULE | Freq: Two times a day (BID) | ORAL | 3 refills | Status: DC

## 2022-03-22 MED ORDER — FAMOTIDINE 20 MG PO TABS: 20.0000 mg | ORAL_TABLET | Freq: Every day | ORAL | 3 refills | Status: DC

## 2022-03-22 NOTE — Progress Notes (Signed)
Patient is alert and oriented. She is sitting in her recliner upon entering in her room. She took her P. O medications without swallowing difficulty noted. Refused IV insertion, held IV iron per MD's approval. Primarily uses RUE versus left extremity d/t weakness.  BLE 1-2+ edema.

## 2022-03-22 NOTE — Discharge Summary (Addendum)
[facility] Discharge Summary  Patient name: SANJANA FOLZ Medical record number: [id number] Date of birth: 1965/04/21 Age: 57 y.o. Gender: female Date of Admission: 03/17/2022  Date of Discharge: 03/22/2022 Admitting Physician: Lind Covert, MD  Primary Care Provider: Cena Benton, MD Consultants: Nephrology  Indication for Hospitalization: AKI, Gastroparesis  Brief Hospital Course:  MARIELLA BLACKWELDER is a 57 y.o.female with a history of T2DM, diabetic gastroparesis, malignant carcinoid tumor of the duodenum, PAF, CKD, HFpEF (EF 50-55%), HTN who was admitted to the [facility] Teaching Service at [facility] for abdominal pain and AKI. Her hospital course is detailed below:  Acute Exacerbation of Chronic Abdominal pain  Diabetic Gastroparesis Pt admitted w/ acute worsening of chronic abdominal pain. CTAP showed no acute process. Thought to be 2/2 acute on chronic diabetic gastroparesis. Takes Dilaudid chronically for chronic pain, follows w/ PMR for this. Received IV Dilaudid for breakthrough pain with improvement. Prior to discharge, pt was started back on her home PO Dilaudid regimen.   T2DM Presented with significant hyperglycemia to 399. With recent hx of N/V, abd pain, and mildly elevated BHB, she was initially started on Endotool for presumed DKA. However, she was not acidotic and did not have an anion gap so this was dc'd. Her blood glucose improved and she was started on basal insulin with SSI. Upon discharge, her regimen was Lantus 35u nightly, Lispro 7u w/ meals. A1c was elevated to 10.1, will need outpt management.  Acute on Chronic Kidney Disease P/w significant elevation in serum Cr to 4.27. Thought to be prerenal given recent hx of N/V as well as recent increase in her home torsemide dose. Her torsemide and spironolactone were held and she was started on fluids (gentle due to CHF). Nephrology was consulted due to concern  for progression of her CKD. They recommended continued IVF and discontinuing spironolactone. Renal U/S unremarkable. Dialysis was not indicated. Upon discharge, patient's Cr improved after multiple days of IVF. Torsemide was restarted at discharge.   HFpEF Echo 7/23 showed EF 50-55%. Home torsemide recently increased from '40mg'$  qd to '50mg'$  BID. This was held on admission. She remained euvolemic during her stay. Upon discharge, she was restarted on home torsemide.  Chronic Anemia  Hgb baseline ~8. Likely component of CKD and anemia of chronic disease. Pt received IV iron and nephrology recommended potential outpatient ESA.   Other chronic conditions were medically managed with home medications and formulary alternatives as necessary   PAF - continued home metoprolol, Eliquis GERD - continued home pepcid, protonix   PCP Follow-up Recommendations: Needs close follow-up for diabetes management. Currently poorly controlled (A1c 10.1) and likely contributing to gastroparesis.  Pt reports taking insulin differently than prescribed.  She reports taking Lantus 50u daily, Lispro 30u four times daily at home.  Spironolactone was discontinued due to decreased kidney function.  Per nephrology, would benefit from outpatient ESA.  Consider weaning Dilaudid due to gastroparesis concerns Consider adding mirtazipine instead of duloxetine for potential additional gastroparesis benefits.  Discharge Diagnoses/Problem List:  AKI, Gastroparesis, HFpEF, T2DM, Chronic Anemia  Disposition: Home  Discharge Condition: Improved and stable  Discharge Exam:  Gen: Alert, NAD. HEENT: NCAT. MMM. CV: RRR, no murmurs. Resp: Normal WOB on RA. CTAB, no crackles or wheezing Abm: Diffusely uncomfortable to palpation. Soft, nondistended. Normal BS. Skin: Warm, well perfused.  Significant Procedures: None  Significant Labs and Imaging:  Recent Labs  Lab 03/21/22 0312  WBC 11.9*  HGB 7.4*  HCT 24.3*  PLT 328    Recent Labs  Lab 03/21/22 0312 03/22/22 0405  NA 130* 135  K 4.7 4.3  CL 102 107  CO2 18* 18*  GLUCOSE 310* 176*  BUN 60* 58*  CREATININE 3.52* 3.02*  CALCIUM 8.2* 8.7*  MG 1.8 1.7  PHOS 3.8 3.5  ALBUMIN 2.9* 2.7*   A1c 10.1  Results/Tests Pending at Time of Discharge: None  Discharge Medications:  Allergies as of 03/22/2022       Reactions   Diazepam [name]   Gabapentin [name], Swelling   Other reaction(s): Unknown   Iodinated Contrast Media Anaphylaxis   11/29/17 Cardiac arrest 1 min after IV contrast, possible allergy vs vasovagal episode Iopamidol  Anaphylaxis  High 11/28/2017  Patient had seizure like activity and then code post 100 cc of isovue 300     Isovue [iopamidol] Anaphylaxis   11/28/17 Patient had seizure like activity and then 1 min code after 100 cc of isovue 300. Possible contrast allergy vs vasovagal episode   Lisinopril Anaphylaxis   Tongue and mouth swelling   Metoclopramide Other (See Comments)   Tardive dyskinesia   Nsaids Anaphylaxis, Other (See Comments)   ULCER   Penicillins Palpitations   Has patient had a PCN reaction causing immediate rash, facial/tongue/throat swelling, SOB or lightheadedness with hypotension: Yes, heart races Has patient had a PCN reaction causing severe rash involving mucus membranes or skin necrosis: No Has patient had a PCN reaction that required hospitalization: Yes  Has patient had a PCN reaction occurring within the last 10 years: No   Dicyclomine Hcl Other (See Comments)   Reports chest pain   Tolmetin Nausea Only, Other (See Comments)   ULCER   Rifamycins Other (See Comments)   unknown   Acetaminophen Nausea Only, Other (See Comments)   Irritates stomach ulcer; Abdominal pain   Cyclobenzaprine Palpitations   Unknown   Oxycodone Palpitations   Tramadol Nausea And Vomiting        Medication List     STOP taking these medications    methocarbamol 750 MG tablet Commonly  known as: ROBAXIN   metoCLOPramide 5 MG tablet Commonly known as: REGLAN   spironolactone 25 MG tablet Commonly known as: ALDACTONE       TAKE these medications    albuterol (2.5 MG/3ML) 0.083% nebulizer solution Commonly known as: PROVENTIL Take 3 mLs (2.5 mg total) by nebulization every 6 (six) hours as needed for wheezing or shortness of breath.   apixaban 5 MG Tabs tablet Commonly known as: ELIQUIS Take 5 mg by mouth 2 (two) times daily.   atorvastatin 10 MG tablet Commonly known as: LIPITOR Take 10 mg by mouth daily.   cetirizine 10 MG tablet Commonly known as: ZYRTEC TAKE 1 TABLET (10 MG TOTAL) BY MOUTH DAILY (AM) What changed: See the new instructions.   DULoxetine HCl 40 MG Cpep Take 40 mg by mouth in the morning and at bedtime.   Easy Comfort Pen Needles 31G X 5 MM Misc Generic drug: Insulin Pen Needle USE 3 TIMES A DAY FOR INSULIN ADMINISTRATION   famotidine 20 MG tablet Commonly known as: PEPCID Take 1 tablet (20 mg total) by mouth daily. What changed:  medication strength how much to take   fluticasone 50 MCG/ACT nasal spray Commonly known as: FLONASE Place 2 sprays into both nostrils daily as needed for allergies or rhinitis. What changed:  how much to take when to take this   folic acid 1 MG tablet  Commonly known as: FOLVITE Take 1 mg by mouth daily.   HumaLOG KwikPen 100 UNIT/ML KwikPen Generic drug: insulin lispro Inject 7 Units into the skin 3 (three) times daily with meals. What changed:  how much to take when to take this   hydrALAZINE 25 MG tablet Commonly known as: APRESOLINE Take 1 tablet (25 mg total) by mouth 3 (three) times daily. What changed:  medication strength how much to take   HYDROmorphone 4 MG tablet Commonly known as: DILAUDID Take 1 tablet (4 mg total) by mouth 2 (two) times daily. May take an extra tablet when pain is severe 20 days out of the month .   isosorbide mononitrate 30 MG 24 hr tablet Commonly  known as: IMDUR Take 0.5 tablets (15 mg total) by mouth daily. What changed:  medication strength how much to take   Lantus SoloStar 100 UNIT/ML Solostar Pen Generic drug: insulin glargine Inject 35 Units into the skin at bedtime. What changed: how much to take   linaclotide 145 MCG Caps capsule Commonly known as: LINZESS Take 1 capsule (145 mcg total) by mouth daily before breakfast. What changed:  when to take this reasons to take this   magnesium chloride 64 MG Tbec SR tablet Commonly known as: SLOW-MAG Take 1 tablet by mouth daily.   meclizine 25 MG tablet Commonly known as: ANTIVERT Take 1 tablet (25 mg total) by mouth 3 (three) times daily as needed for dizziness.   metoprolol succinate 50 MG 24 hr tablet Commonly known as: TOPROL-XL Take 1 tablet (50 mg total) by mouth daily. Take with or immediately following a meal.   ondansetron 4 MG disintegrating tablet Commonly known as: Zofran ODT Take 1 tablet (4 mg total) by mouth every 8 (eight) hours as needed for nausea or vomiting.   pantoprazole 40 MG tablet Commonly known as: PROTONIX TAKE 1 TABLET BY MOUTH 2 (TWO) TIMES DAILY. (AM+BEDTIME) What changed: See the new instructions.   Senna Plus 8.6-50 MG tablet Generic drug: senna-docusate Take 1 tablet by mouth daily.   sodium bicarbonate 650 MG tablet Take 1 tablet (650 mg total) by mouth 2 (two) times daily.   torsemide 20 MG tablet Commonly known as: DEMADEX Take 2 tablets by mouth daily.   Vitamin D (Ergocalciferol) 1.25 MG (50000 UNIT) Caps capsule Commonly known as: DRISDOL Take 50,000 Units by mouth every Monday.        Discharge Instructions: Please refer to Patient Instructions section of EMR for full details.  Patient was counseled important signs and symptoms that should prompt return to medical care, changes in medications, dietary instructions, activity restrictions, and follow up appointments.   Follow-Up Appointments: 04/26/2022 11:40am  with Danella Sensing. NP  Arlyce Dice, MD 03/22/2022, 1:17 PM PGY-1, [place] Upper-Level Resident Addendum   I have independently interviewed and examined the patient. I have discussed the above with the original author and agree with their documentation. Please see also any attending notes.   Sonia Side D.Jenetta Downer PGY-3, [facility] 03/22/2022 1:30 PM  [place] Service pager: [phone] (text pages welcome through [facility])

## 2022-03-22 NOTE — Discharge Instructions (Addendum)
Dear [name],  Thank you for letting us participate in your care. You were hospitalized for abdominal pain and vomiting and diagnosed with an Acute kidney injury superimposed on chronic kidney disease ([place]) and suspected gastroparesis. You were treated with anti-nausea medications and pain medications.   POST-HOSPITAL & CARE INSTRUCTIONS We reduced your Pepcid to 20 mg daily.   For your vomiting, you can continue taking Zofran as needed You can continue taking your home dilaudid for pain, however this medicine may be worsening your gastroparesis and causing worsening nausea and vomiting. You should start taking Linzess daily to help reduce constipation while you are taking the dilaudid.  We reduced your hydralazine to 25 mg TID, and your Imdur to 15 mg daily due to your low blood pressure.  We discontinued your spironolactone due to your kidney function, please review your medication list carefully and take a copy of it to your PCP.  Go to your follow up appointments (listed below)   DOCTOR'S APPOINTMENT   Future Appointments  Date Time Provider [place]  04/26/2022 11:40 AM Bayard Hugger, NP [facility] CPR     Take care and be well!  [facility]  [place],  [address] [phone]

## 2022-03-22 NOTE — Inpatient Diabetes Management (Addendum)
Inpatient Diabetes Program Recommendations  AACE/ADA: New Consensus Statement on Inpatient Glycemic Control (2015)  Target Ranges:  Prepandial:   less than 140 mg/dL      Peak postprandial:   less than 180 mg/dL (1-2 hours)      Critically ill patients:  140 - 180 mg/dL    Latest Reference Range & Units 03/21/22 03:12  Hemoglobin A1C 4.8 - 5.6 % 10.1 (H)  243 mg/dl  (H): Data is abnormally high  Latest Reference Range & Units 03/20/22 07:54 03/20/22 11:41 03/20/22 16:08 03/20/22 20:40  Glucose-Capillary 70 - 99 mg/dL 237 (H) 260 (H) 288 (H) 283 (H)  (H): Data is abnormally high  Latest Reference Range & Units 03/21/22 08:52 03/21/22 11:37 03/21/22 16:26  Glucose-Capillary 70 - 99 mg/dL 209 (H)  7 units Novolog '@1008'$  267 (H)  11 units Novolog  343 (H)  15 units Novolog   Pt REFUSED CBG check, Semglee, and Novolog at bedtime  (H): Data is abnormally high  Latest Reference Range & Units 03/22/22 07:50  Glucose-Capillary 70 - 99 mg/dL 145 (H)  (H): Data is abnormally high   Admit with: Abd Pain with N&V/ Gastroparesis/ Acute Kidney Injury on CKD  History: DM, CKD  Home DM Meds: Humalog 30 units QID        Lantus 50 units QHS  Current Orders: Semglee 60 units QHS      Novolog Resistant Correction Scale/ SSI (0-20 units) TID AC + HS     MD- Please note that pt REFUSED Novolog and Semglee insulins last PM    --Will follow patient during hospitalization--  Wyn Quaker RN, MSN, [place] Diabetes Coordinator Inpatient Glycemic Control Team Team Pager: [phone] (8a-5p)

## 2022-03-22 NOTE — Care Management Important Message (Signed)
Important Message  Patient Details  Name: Deborah [name] MRN: [id number] Date of Birth: 06-05-1965   Medicare Important Message Given:  Yes     [name] 03/22/2022, 2:59 PM

## 2022-03-22 NOTE — Progress Notes (Signed)
Patient ID: Deborah [name], female   DOB: [birth year], 57 y.o.   MRN: [id number] S: Feels well, no new complaints and informs me that she will be discharged today. O:BP 120/61 (BP Location: Left Wrist)   Pulse 93   Temp 98.7 F (37.1 C) (Oral)   Resp 15   Ht '5\' 6"'$  (1.676 m)   Wt 115.2 kg   LMP 10/10/2012   SpO2 100%   BMI 40.99 kg/m   Intake/Output Summary (Last 24 hours) at 03/22/2022 1132 Last data filed at 03/21/2022 2026 Gross per 24 hour  Intake 393.77 ml  Output --  Net 393.77 ml   Intake/Output: I/O last 3 completed shifts: In: 1591.3 [P.O.:120; I.V.:1197.5; IV Piggyback:273.8] Out: -   Intake/Output this shift:  No intake/output data recorded. Weight change:  Gen: NAD   Recent Labs  Lab 03/17/22 1654 03/17/22 1842 03/18/22 0317 03/19/22 0434 03/20/22 0228 03/21/22 0312 03/22/22 0405  NA 129* 129* 136 129* 133* 130* 135  K 5.6* 5.8* 3.9 4.7 4.5 4.7 4.3  CL 95*  --  101 98 101 102 107  CO2 19*  --  21* 19* 21* 18* 18*  GLUCOSE 442*  --  163* 299* 305* 310* 176*  BUN 53*  --  52* 59* 60* 60* 58*  CREATININE 4.27*  --  4.41* 5.93* 5.07* 3.52* 3.02*  ALBUMIN 3.1*  --  3.3*  --  2.9* 2.9* 2.7*  CALCIUM 9.0  --  9.2 7.8* 8.3* 8.2* 8.7*  PHOS  --   --   --   --  4.9* 3.8 3.5  AST 22  --  15  --   --   --   --   ALT 13  --  10  --   --   --   --    Liver Function Tests: Recent Labs  Lab 03/17/22 1654 03/18/22 0317 03/20/22 0228 03/21/22 0312 03/22/22 0405  AST 22 15  --   --   --   ALT 13 10  --   --   --   ALKPHOS 106 99  --   --   --   BILITOT 1.3* 0.6  --   --   --   PROT 7.4 7.8  --   --   --   ALBUMIN 3.1* 3.3* 2.9* 2.9* 2.7*   Recent Labs  Lab 03/17/22 1654  LIPASE 37   No results for input(s): "AMMONIA" in the last 168 hours. CBC: Recent Labs  Lab 03/17/22 1654 03/17/22 1842 03/18/22 0317 03/19/22 0434 03/21/22 0312  WBC 14.6*  --  14.3* 12.3* 11.9*  NEUTROABS 11.6*  --   --   --   --   HGB 8.5*   < > 8.4* 7.2* 7.4*  HCT  26.6*   < > 27.1* 24.3* 24.3*  MCV 93.3  --  93.1 96.4 94.9  PLT 464*  --  445* 365 328   < > = values in this interval not displayed.   Cardiac Enzymes: No results for input(s): "CKTOTAL", "CKMB", "CKMBINDEX", "TROPONINI" in the last 168 hours. CBG: Recent Labs  Lab 03/20/22 2040 03/21/22 0852 03/21/22 1137 03/21/22 1626 03/22/22 0750  GLUCAP 283* 209* 267* 343* 145*    Iron Studies:  Recent Labs    03/19/22 1258  IRON 64  TIBC 346  FERRITIN 306   Studies/Results: No results found.  apixaban  5 mg Oral BID   atorvastatin  10 mg Oral Daily   DULoxetine  40 mg Oral Daily   famotidine  20 mg Oral Daily   fluticasone  1 spray Each Nare Daily   folic acid  1 mg Oral Daily   HYDROmorphone  4 mg Oral BID   insulin aspart  0-20 Units Subcutaneous TID WC   insulin aspart  0-5 Units Subcutaneous QHS   insulin glargine-yfgn  60 Units Subcutaneous QHS   isosorbide mononitrate  60 mg Oral Daily   lidocaine  1 patch Transdermal Q24H   linaclotide  145 mcg Oral Daily   loratadine  10 mg Oral Daily   metoprolol succinate  50 mg Oral Daily   pantoprazole  40 mg Oral BID   sodium bicarbonate  650 mg Oral BID    BMET    Component Value Date/Time   NA 135 03/22/2022 0405   NA 137 09/20/2019 1530   K 4.3 03/22/2022 0405   CL 107 03/22/2022 0405   CO2 18 (L) 03/22/2022 0405   GLUCOSE 176 (H) 03/22/2022 0405   BUN 58 (H) 03/22/2022 0405   BUN 18 09/20/2019 1530   CREATININE 3.02 (H) 03/22/2022 0405   CALCIUM 8.7 (L) 03/22/2022 0405   GFRNONAA 17 (L) 03/22/2022 0405   GFRAA 49 (L) 11/30/2019 0356   CBC    Component Value Date/Time   WBC 11.9 (H) 03/21/2022 0312   RBC 2.56 (L) 03/21/2022 0312   HGB 7.4 (L) 03/21/2022 0312   HGB 8.8 (L) 10/09/2019 0906   HGB 9.6 (L) 09/03/2019 1620   HCT 24.3 (L) 03/21/2022 0312   HCT 30.4 (L) 09/03/2019 1620   PLT 328 03/21/2022 0312   PLT 348 10/09/2019 0906   PLT 451 (H) 09/03/2019 1620   MCV 94.9 03/21/2022 0312   MCV 85  09/03/2019 1620   MCH 28.9 03/21/2022 0312   MCHC 30.5 03/21/2022 0312   RDW 13.0 03/21/2022 0312   RDW 13.6 09/03/2019 1620   LYMPHSABS 2.0 03/17/2022 1654   LYMPHSABS 2.2 09/03/2019 1620   MONOABS 0.7 03/17/2022 1654   EOSABS 0.1 03/17/2022 1654   EOSABS 0.3 09/03/2019 1620   BASOSABS 0.1 03/17/2022 1654   BASOSABS 0.1 09/03/2019 1620     Assessment/Plan: Acute kidney injury on chronic kidney disease stage IV: With multiple previous episodes of prerenal acute kidney injury and underlying chronic kidney disease likely with progression; recent baseline creatinine suspected to be around 2.9 (from 2.2 back in 2022).  Presentation consistent with recurrent prerenal azotemia with possible mild ATN based on h/o N/V prior to admission and diabetic gastroparesis.  Urine output has not been charted but she reports to have been urinating well by ambulating to the bathroom/bedside commode.  Labs this morning show improving renal function and at her baseline Scr.  Will arrange for outpatient nephrology follow-up with Dr. Candiss Norse at [facility].  Nothing further to add. Will sign off.  Please call with any questions or concerns.  Ok to resume home torsemide but would not resume spironolactone. Hyponatremia: Likely secondary to impaired free water excretion and hypotonic fluid intake in the setting of acute kidney injury compounded by hyperglycemia and pseudohyponatremia.  Now resolved Anion gap metabolic acidosis: Secondary to acute kidney injury, improving with fluids and ongoing sodium bicarbonate supplementation.  Anemia: Likely secondary to chronic illness including chronic kidney disease.  Iron panel from yesterday shows low iron saturation and intravenous iron series ordered.  Will likely need ESA initiated as an outpatient.  Abdominal pain: Appears to be associated with diabetic gastroparesis and ongoing pain  management per primary service; she is intolerant to prokinetic therapy  Donetta Potts, MD [facility]

## 2022-04-21 ENCOUNTER — Other Ambulatory Visit: Payer: Self-pay | Admitting: Registered Nurse

## 2022-04-26 ENCOUNTER — Encounter: Payer: 59 | Admitting: Registered Nurse

## 2022-04-26 ENCOUNTER — Ambulatory Visit: Payer: Medicare Other | Admitting: Registered Nurse

## 2022-04-29 ENCOUNTER — Other Ambulatory Visit: Payer: Self-pay | Admitting: Registered Nurse

## 2022-04-29 NOTE — Telephone Encounter (Signed)
Patient was scheduled to see [name] on 2/19 but appt was rescheduled due to [name] being out of office until 2/26. Patient is requesting a refill on her Hydromorphone.

## 2022-04-30 ENCOUNTER — Telehealth: Payer: Self-pay | Admitting: Physical Medicine and Rehabilitation

## 2022-05-03 ENCOUNTER — Encounter: Payer: 59 | Admitting: Registered Nurse

## 2022-05-05 NOTE — Telephone Encounter (Signed)
I think I came back and asked you about it instead. I will close this encounter

## 2022-05-08 IMAGING — CT CT ABD-PELV W/O CM
2 of 4 series · 17 of 46 positions shown, 19 images · non-contrast
Comparison: CT abdomen pelvis 01/11/2020

CLINICAL DATA: Nausea and vomiting.

EXAM:
CT ABDOMEN AND PELVIS WITHOUT CONTRAST
TECHNIQUE: Multidetector CT imaging of the abdomen and pelvis was performed
following the standard protocol without IV contrast.

[Series 2: axial st · axial · 0.89mm/px · z∈[+1198,+1593]mm · 14 of 89 slices shown, 16 images]
[im 5/89  soft-tissue]
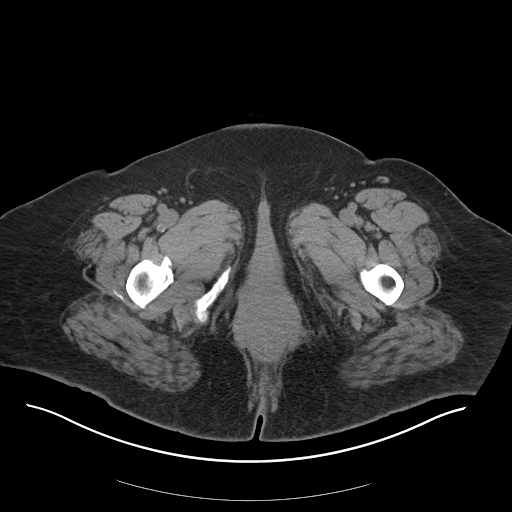
[im 5/89  bone]
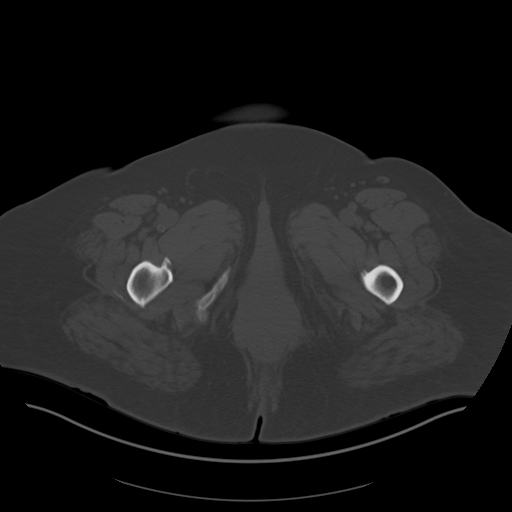
[im 10/89  soft-tissue]
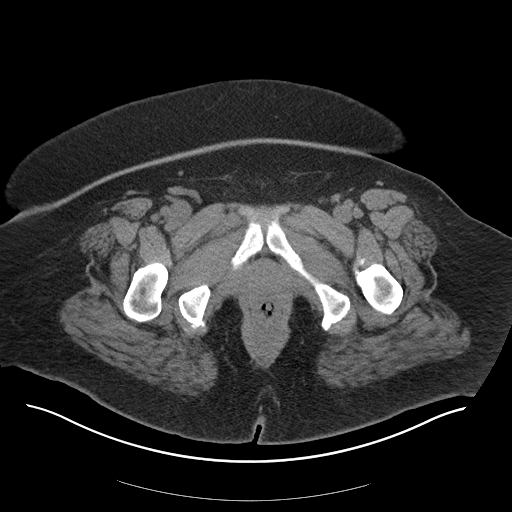
[im 19/89  soft-tissue]
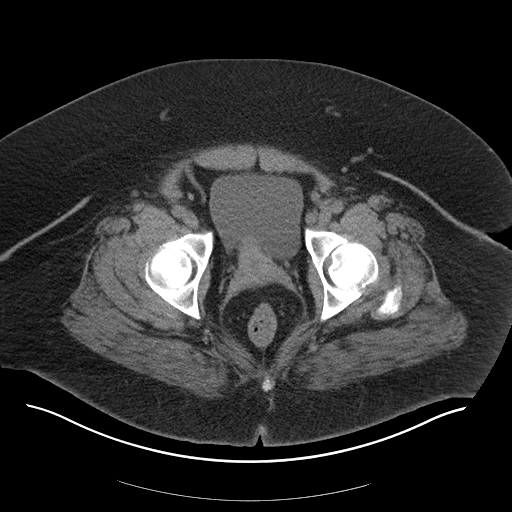
[im 24/89  soft-tissue]
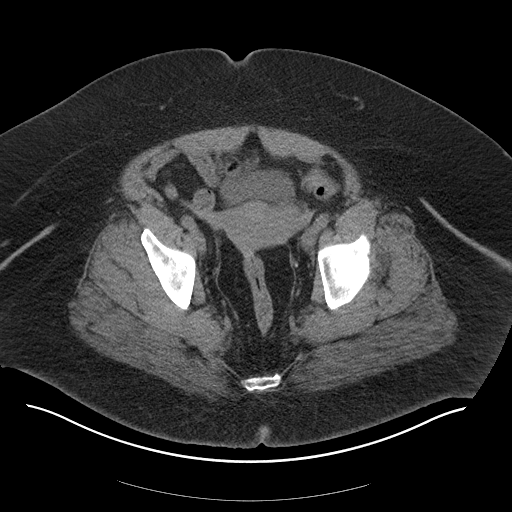
[im 28/89  soft-tissue]
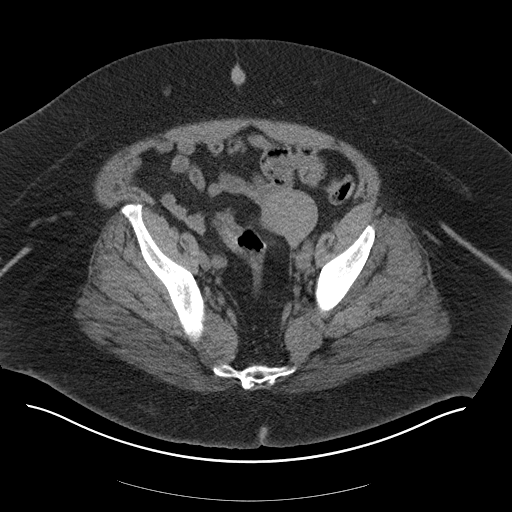
[im 38/89  soft-tissue]
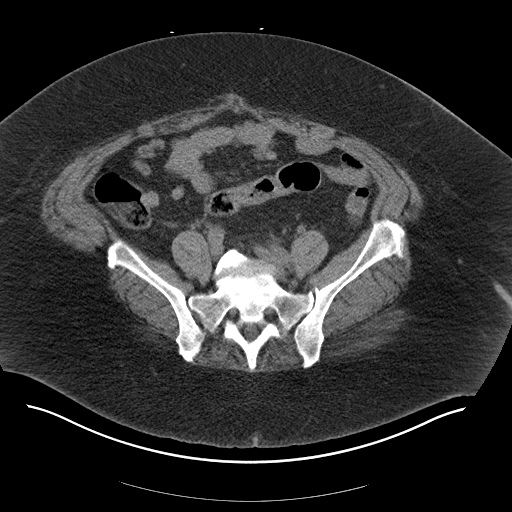
[im 42/89  soft-tissue]
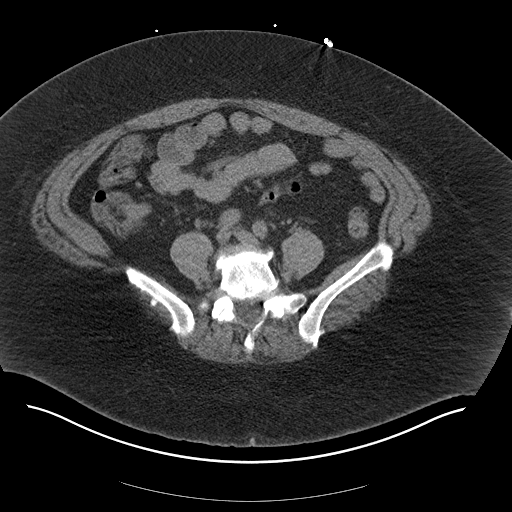
[im 47/89  soft-tissue]
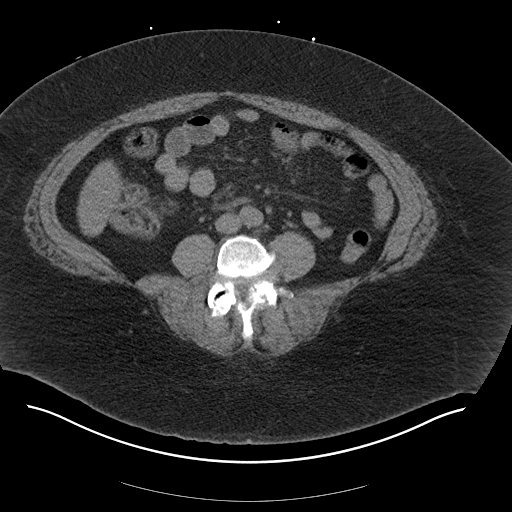
[im 51/89  soft-tissue]
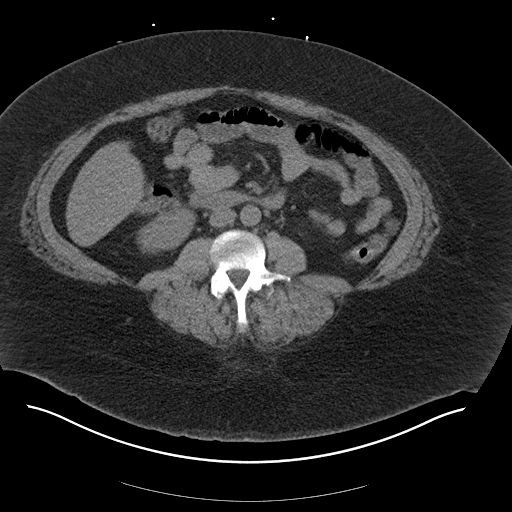
[im 51/89  bone]
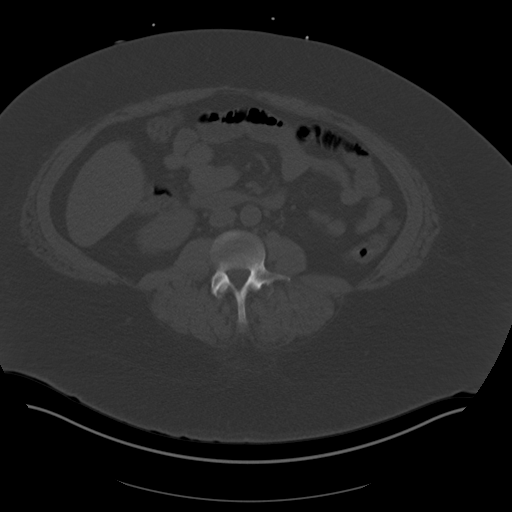
[im 61/89  soft-tissue]
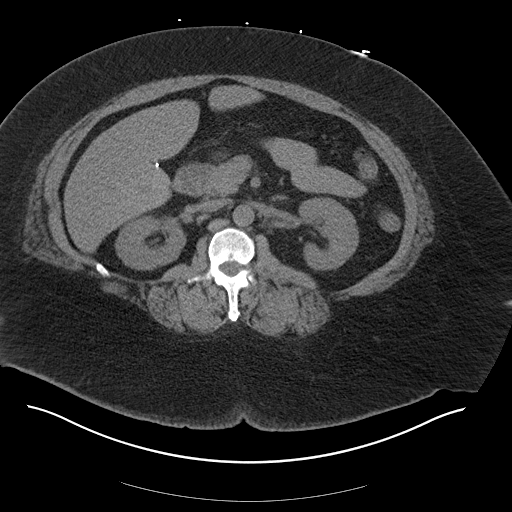
[im 65/89  soft-tissue]
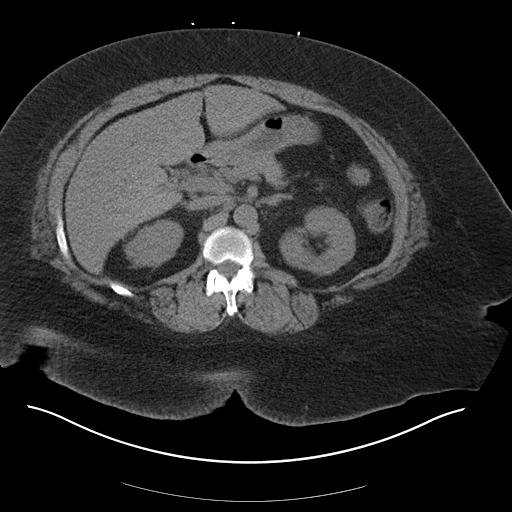
[im 70/89  soft-tissue]
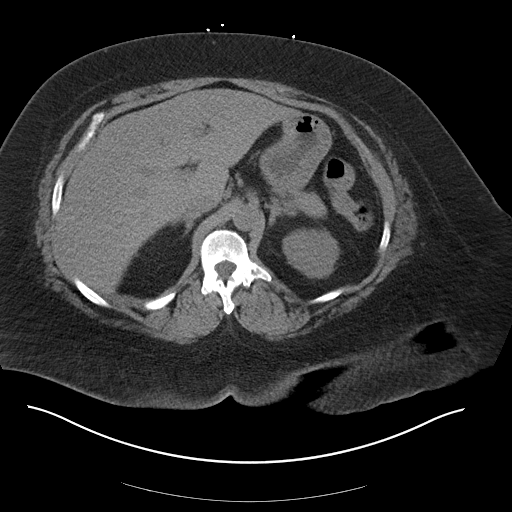
[im 79/89  soft-tissue]
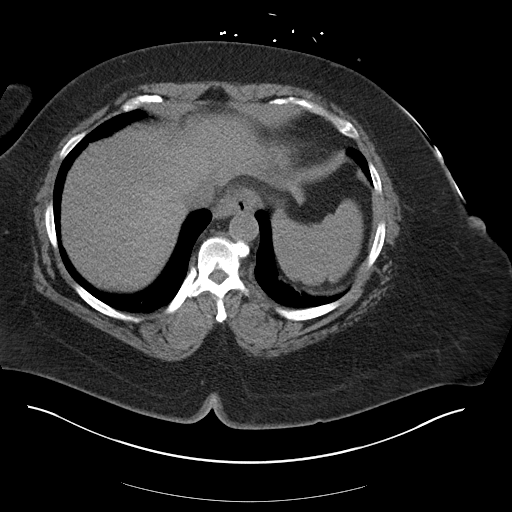
[im 84/89  soft-tissue]
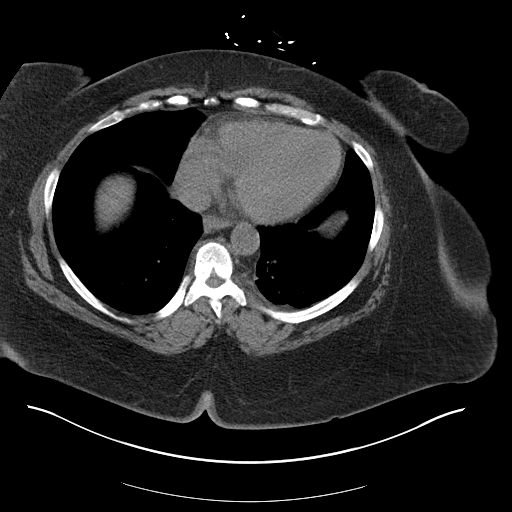

[Series 5: coronal st · coronal · 0.85mm/px · 3 of 161 slices shown]
[im 54/161  soft-tissue]
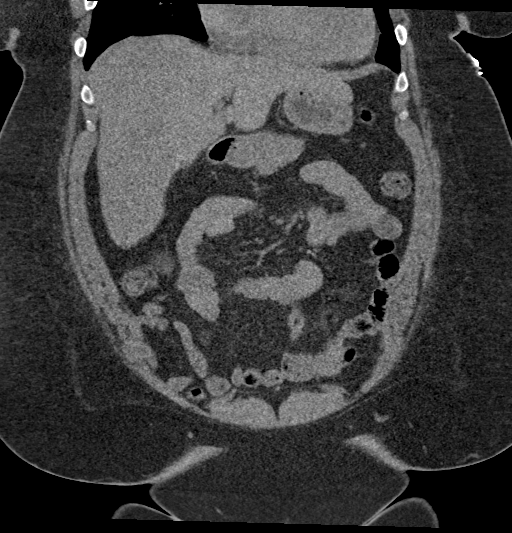
[im 72/161  soft-tissue]
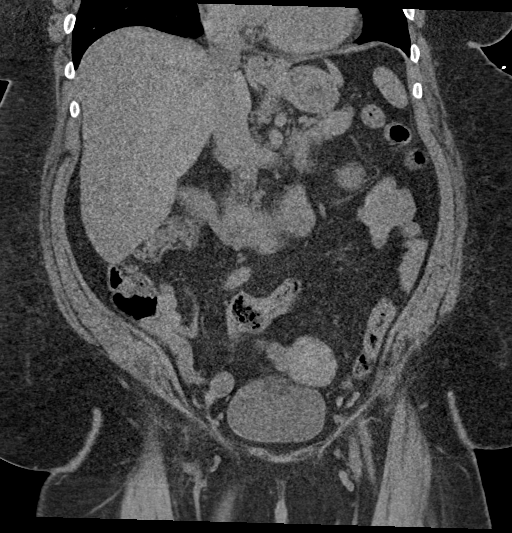
[im 89/161  soft-tissue]
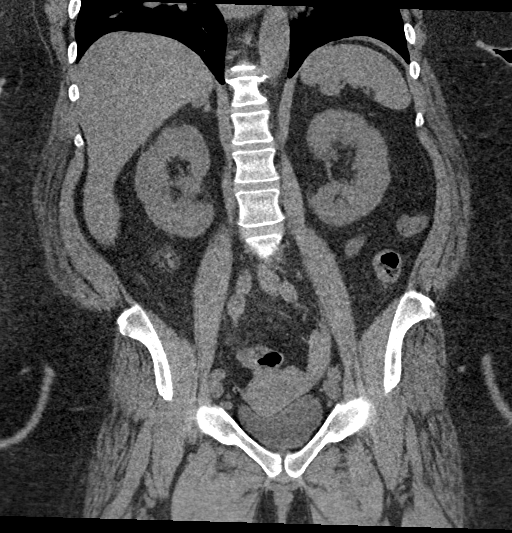

[17 of 46 positions shown; findings below may reference images not displayed]

FINDINGS: Lower chest: Normal heart size. Dependent atelectasis bilateral
lower lobes. No pleural effusion.

Hepatobiliary: Liver is diffusely low in attenuation compatible with
steatosis. Prior cholecystectomy.

Pancreas: Unremarkable

Spleen: Unremarkable

Adrenals/Urinary Tract: Normal adrenal glands. Kidneys are symmetric
in size. No hydronephrosis. Urinary bladder is unremarkable.

Stomach/Bowel: Normal morphology of the stomach. No evidence for
bowel obstruction, bowel wall thickening. No free fluid or free
intraperitoneal air.

Vascular/Lymphatic: Normal caliber abdominal aorta. Peripheral
calcified atherosclerotic plaque. No retroperitoneal
lymphadenopathy.

Reproductive: Redemonstrated exophytic fibroid off the uterine
fundus. Adnexal structures unremarkable.

Other: None.

Musculoskeletal: Lower thoracic and lumbar spine degenerative
changes.
IMPRESSION: 1. No acute process within the abdomen or pelvis.
2. Hepatic steatosis.

## 2022-05-10 ENCOUNTER — Other Ambulatory Visit: Payer: Self-pay

## 2022-05-10 ENCOUNTER — Emergency Department (HOSPITAL_COMMUNITY): Payer: 59

## 2022-05-10 ENCOUNTER — Inpatient Hospital Stay (HOSPITAL_COMMUNITY)
Admission: EM | Admit: 2022-05-10 | Discharge: 2022-05-27 | DRG: 638 | Disposition: A | Discharge status: Skilled Nursing Facility | Payer: 59 | Attending: Internal Medicine | Admitting: Internal Medicine

## 2022-05-10 ENCOUNTER — Encounter (HOSPITAL_COMMUNITY): Payer: Self-pay

## 2022-05-10 DIAGNOSIS — M5431 Sciatica, right side: Secondary | ICD-10-CM | POA: Diagnosis present

## 2022-05-10 DIAGNOSIS — E11649 Type 2 diabetes mellitus with hypoglycemia without coma: Secondary | ICD-10-CM | POA: Diagnosis not present

## 2022-05-10 DIAGNOSIS — I480 Paroxysmal atrial fibrillation: Secondary | ICD-10-CM | POA: Diagnosis present

## 2022-05-10 DIAGNOSIS — Z833 Family history of diabetes mellitus: Secondary | ICD-10-CM

## 2022-05-10 DIAGNOSIS — R739 Hyperglycemia, unspecified: Secondary | ICD-10-CM

## 2022-05-10 DIAGNOSIS — M5432 Sciatica, left side: Secondary | ICD-10-CM | POA: Diagnosis present

## 2022-05-10 DIAGNOSIS — Z888 Allergy status to other drugs, medicaments and biological substances status: Secondary | ICD-10-CM

## 2022-05-10 DIAGNOSIS — Z765 Malingerer [conscious simulation]: Secondary | ICD-10-CM

## 2022-05-10 DIAGNOSIS — R109 Unspecified abdominal pain: Principal | ICD-10-CM

## 2022-05-10 DIAGNOSIS — T40605A Adverse effect of unspecified narcotics, initial encounter: Secondary | ICD-10-CM | POA: Diagnosis not present

## 2022-05-10 DIAGNOSIS — Z8249 Family history of ischemic heart disease and other diseases of the circulatory system: Secondary | ICD-10-CM

## 2022-05-10 DIAGNOSIS — I428 Other cardiomyopathies: Secondary | ICD-10-CM | POA: Diagnosis present

## 2022-05-10 DIAGNOSIS — K2100 Gastro-esophageal reflux disease with esophagitis, without bleeding: Secondary | ICD-10-CM | POA: Diagnosis present

## 2022-05-10 DIAGNOSIS — Z7901 Long term (current) use of anticoagulants: Secondary | ICD-10-CM

## 2022-05-10 DIAGNOSIS — E1100 Type 2 diabetes mellitus with hyperosmolarity without nonketotic hyperglycemic-hyperosmolar coma (NKHHC): Secondary | ICD-10-CM | POA: Diagnosis not present

## 2022-05-10 DIAGNOSIS — E1143 Type 2 diabetes mellitus with diabetic autonomic (poly)neuropathy: Secondary | ICD-10-CM | POA: Diagnosis present

## 2022-05-10 DIAGNOSIS — Z602 Problems related to living alone: Secondary | ICD-10-CM | POA: Diagnosis present

## 2022-05-10 DIAGNOSIS — M109 Gout, unspecified: Secondary | ICD-10-CM | POA: Diagnosis present

## 2022-05-10 DIAGNOSIS — E1122 Type 2 diabetes mellitus with diabetic chronic kidney disease: Secondary | ICD-10-CM | POA: Diagnosis present

## 2022-05-10 DIAGNOSIS — C7A010 Malignant carcinoid tumor of the duodenum: Secondary | ICD-10-CM | POA: Diagnosis present

## 2022-05-10 DIAGNOSIS — Z794 Long term (current) use of insulin: Secondary | ICD-10-CM

## 2022-05-10 DIAGNOSIS — Z8673 Personal history of transient ischemic attack (TIA), and cerebral infarction without residual deficits: Secondary | ICD-10-CM

## 2022-05-10 DIAGNOSIS — N179 Acute kidney failure, unspecified: Secondary | ICD-10-CM | POA: Diagnosis present

## 2022-05-10 DIAGNOSIS — M797 Fibromyalgia: Secondary | ICD-10-CM | POA: Diagnosis present

## 2022-05-10 DIAGNOSIS — Z886 Allergy status to analgesic agent status: Secondary | ICD-10-CM

## 2022-05-10 DIAGNOSIS — K317 Polyp of stomach and duodenum: Secondary | ICD-10-CM | POA: Diagnosis present

## 2022-05-10 DIAGNOSIS — I5042 Chronic combined systolic (congestive) and diastolic (congestive) heart failure: Secondary | ICD-10-CM | POA: Diagnosis present

## 2022-05-10 DIAGNOSIS — E871 Hypo-osmolality and hyponatremia: Secondary | ICD-10-CM | POA: Diagnosis present

## 2022-05-10 DIAGNOSIS — R32 Unspecified urinary incontinence: Secondary | ICD-10-CM | POA: Diagnosis present

## 2022-05-10 DIAGNOSIS — D509 Iron deficiency anemia, unspecified: Secondary | ICD-10-CM | POA: Diagnosis present

## 2022-05-10 DIAGNOSIS — I130 Hypertensive heart and chronic kidney disease with heart failure and stage 1 through stage 4 chronic kidney disease, or unspecified chronic kidney disease: Secondary | ICD-10-CM | POA: Diagnosis present

## 2022-05-10 DIAGNOSIS — K269 Duodenal ulcer, unspecified as acute or chronic, without hemorrhage or perforation: Secondary | ICD-10-CM | POA: Diagnosis present

## 2022-05-10 DIAGNOSIS — Z880 Allergy status to penicillin: Secondary | ICD-10-CM

## 2022-05-10 DIAGNOSIS — I10 Essential (primary) hypertension: Secondary | ICD-10-CM | POA: Diagnosis present

## 2022-05-10 DIAGNOSIS — K3184 Gastroparesis: Secondary | ICD-10-CM | POA: Diagnosis present

## 2022-05-10 DIAGNOSIS — K219 Gastro-esophageal reflux disease without esophagitis: Secondary | ICD-10-CM | POA: Diagnosis present

## 2022-05-10 DIAGNOSIS — M25561 Pain in right knee: Secondary | ICD-10-CM | POA: Diagnosis not present

## 2022-05-10 DIAGNOSIS — D72829 Elevated white blood cell count, unspecified: Secondary | ICD-10-CM | POA: Diagnosis present

## 2022-05-10 DIAGNOSIS — Z993 Dependence on wheelchair: Secondary | ICD-10-CM

## 2022-05-10 DIAGNOSIS — Z79899 Other long term (current) drug therapy: Secondary | ICD-10-CM

## 2022-05-10 DIAGNOSIS — Z6841 Body Mass Index (BMI) 40.0 and over, adult: Secondary | ICD-10-CM

## 2022-05-10 DIAGNOSIS — D649 Anemia, unspecified: Secondary | ICD-10-CM | POA: Diagnosis present

## 2022-05-10 DIAGNOSIS — R112 Nausea with vomiting, unspecified: Secondary | ICD-10-CM | POA: Diagnosis present

## 2022-05-10 DIAGNOSIS — Z8711 Personal history of peptic ulcer disease: Secondary | ICD-10-CM

## 2022-05-10 DIAGNOSIS — Z885 Allergy status to narcotic agent status: Secondary | ICD-10-CM

## 2022-05-10 DIAGNOSIS — F410 Panic disorder [episodic paroxysmal anxiety] without agoraphobia: Secondary | ICD-10-CM | POA: Diagnosis not present

## 2022-05-10 DIAGNOSIS — E876 Hypokalemia: Secondary | ICD-10-CM | POA: Diagnosis present

## 2022-05-10 DIAGNOSIS — D631 Anemia in chronic kidney disease: Secondary | ICD-10-CM | POA: Diagnosis present

## 2022-05-10 DIAGNOSIS — Z1152 Encounter for screening for COVID-19: Secondary | ICD-10-CM

## 2022-05-10 DIAGNOSIS — M47816 Spondylosis without myelopathy or radiculopathy, lumbar region: Secondary | ICD-10-CM | POA: Diagnosis present

## 2022-05-10 DIAGNOSIS — E785 Hyperlipidemia, unspecified: Secondary | ICD-10-CM | POA: Diagnosis present

## 2022-05-10 DIAGNOSIS — M48062 Spinal stenosis, lumbar region with neurogenic claudication: Secondary | ICD-10-CM | POA: Diagnosis present

## 2022-05-10 DIAGNOSIS — K5903 Drug induced constipation: Secondary | ICD-10-CM | POA: Diagnosis not present

## 2022-05-10 DIAGNOSIS — M25512 Pain in left shoulder: Secondary | ICD-10-CM | POA: Diagnosis not present

## 2022-05-10 DIAGNOSIS — G8929 Other chronic pain: Secondary | ICD-10-CM | POA: Diagnosis present

## 2022-05-10 DIAGNOSIS — N184 Chronic kidney disease, stage 4 (severe): Secondary | ICD-10-CM | POA: Diagnosis present

## 2022-05-10 LAB — I-STAT VENOUS BLOOD GAS, ED
Acid-base deficit: 3 mmol/L — ABNORMAL HIGH (ref 0.0–2.0)
Bicarbonate: 21.5 mmol/L (ref 20.0–28.0)
Calcium, Ion: 1.16 mmol/L (ref 1.15–1.40)
HCT: 27 % — ABNORMAL LOW (ref 36.0–46.0)
Hemoglobin: 9.2 g/dL — ABNORMAL LOW (ref 12.0–15.0)
O2 Saturation: 38 %
Potassium: 4.4 mmol/L (ref 3.5–5.1)
Sodium: 128 mmol/L — ABNORMAL LOW (ref 135–145)
TCO2: 23 mmol/L (ref 22–32)
pCO2, Ven: 35.7 mmHg — ABNORMAL LOW (ref 44–60)
pH, Ven: 7.388 (ref 7.25–7.43)
pO2, Ven: 22 mmHg — CL (ref 32–45)

## 2022-05-10 LAB — RESP PANEL BY RT-PCR (RSV, FLU A&B, COVID)  RVPGX2
Influenza A by PCR: NEGATIVE
Influenza B by PCR: NEGATIVE
Resp Syncytial Virus by PCR: NEGATIVE
SARS Coronavirus 2 by RT PCR: NEGATIVE

## 2022-05-10 LAB — COMPREHENSIVE METABOLIC PANEL
ALT: 7 U/L (ref 0–44)
AST: 14 U/L — ABNORMAL LOW (ref 15–41)
Albumin: 2.8 g/dL — ABNORMAL LOW (ref 3.5–5.0)
Alkaline Phosphatase: 89 U/L (ref 38–126)
Anion gap: 13 (ref 5–15)
BUN: 82 mg/dL — ABNORMAL HIGH (ref 6–20)
CO2: 21 mmol/L — ABNORMAL LOW (ref 22–32)
Calcium: 9.5 mg/dL (ref 8.9–10.3)
Chloride: 93 mmol/L — ABNORMAL LOW (ref 98–111)
Creatinine, Ser: 3.67 mg/dL — ABNORMAL HIGH (ref 0.44–1.00)
GFR, Estimated: 14 mL/min — ABNORMAL LOW (ref >60)
Glucose, Bld: 540 mg/dL (ref 70–99)
Potassium: 4.3 mmol/L (ref 3.5–5.1)
Sodium: 127 mmol/L — ABNORMAL LOW (ref 135–145)
Total Bilirubin: 0.6 mg/dL (ref 0.3–1.2)
Total Protein: 8.3 g/dL — ABNORMAL HIGH (ref 6.5–8.1)

## 2022-05-10 LAB — CBC WITH DIFFERENTIAL/PLATELET
Abs Immature Granulocytes: 0.08 10*3/uL — ABNORMAL HIGH (ref 0.00–0.07)
Basophils Absolute: 0.1 10*3/uL (ref 0.0–0.1)
Basophils Relative: 0 %
Eosinophils Absolute: 0.2 10*3/uL (ref 0.0–0.5)
Eosinophils Relative: 1 %
HCT: 25.2 % — ABNORMAL LOW (ref 36.0–46.0)
Hemoglobin: 7.7 g/dL — ABNORMAL LOW (ref 12.0–15.0)
Immature Granulocytes: 1 %
Lymphocytes Relative: 10 %
Lymphs Abs: 1.6 10*3/uL (ref 0.7–4.0)
MCH: 28.4 pg (ref 26.0–34.0)
MCHC: 30.6 g/dL (ref 30.0–36.0)
MCV: 93 fL (ref 80.0–100.0)
Monocytes Absolute: 0.7 10*3/uL (ref 0.1–1.0)
Monocytes Relative: 5 %
Neutro Abs: 12.8 10*3/uL — ABNORMAL HIGH (ref 1.7–7.7)
Neutrophils Relative %: 83 %
Platelets: 502 10*3/uL — ABNORMAL HIGH (ref 150–400)
RBC: 2.71 MIL/uL — ABNORMAL LOW (ref 3.87–5.11)
RDW: 13.7 % (ref 11.5–15.5)
WBC: 15.4 10*3/uL — ABNORMAL HIGH (ref 4.0–10.5)
nRBC: 0 % (ref 0.0–0.2)

## 2022-05-10 LAB — TROPONIN I (HIGH SENSITIVITY): Troponin I (High Sensitivity): 12 ng/L (ref <18)

## 2022-05-10 LAB — ETHANOL: Alcohol, Ethyl (B): <10 mg/dL (ref <10)

## 2022-05-10 LAB — LIPASE, BLOOD: Lipase: 42 U/L (ref 11–51)

## 2022-05-10 LAB — MAGNESIUM: Magnesium: 1.6 mg/dL — ABNORMAL LOW (ref 1.7–2.4)

## 2022-05-10 LAB — CBG MONITORING, ED
Glucose-Capillary: 521 mg/dL (ref 70–99)
Glucose-Capillary: 445 mg/dL — ABNORMAL HIGH (ref 70–99)

## 2022-05-10 LAB — PHOSPHORUS: Phosphorus: 3.3 mg/dL (ref 2.5–4.6)

## 2022-05-10 LAB — BRAIN NATRIURETIC PEPTIDE: B Natriuretic Peptide: 24.5 pg/mL (ref 0.0–100.0)

## 2022-05-10 MED ORDER — DEXTROSE 50 % IV SOLN: 0.0000 mL | INTRAVENOUS | Status: DC | PRN

## 2022-05-10 MED ORDER — LACTATED RINGERS IV SOLN: INTRAVENOUS | Status: DC

## 2022-05-10 MED ORDER — ONDANSETRON HCL 4 MG/2ML IJ SOLN
4.0000 mg | Freq: Once | INTRAMUSCULAR | Status: AC
  Administered 2022-05-10: 4 mg via INTRAVENOUS
  Filled 2022-05-10: qty 2

## 2022-05-10 MED ORDER — POTASSIUM CHLORIDE CRYS ER 20 MEQ PO TBCR
40.0000 meq | EXTENDED_RELEASE_TABLET | Freq: Once | ORAL | Status: AC
  Administered 2022-05-10: 40 meq via ORAL
  Filled 2022-05-10: qty 2

## 2022-05-10 MED ORDER — INSULIN REGULAR(HUMAN) IN NACL 100-0.9 UT/100ML-% IV SOLN
INTRAVENOUS | Status: DC
  Administered 2022-05-11: 11.5 [IU]/h via INTRAVENOUS
  Filled 2022-05-10: qty 100

## 2022-05-10 MED ORDER — HYDROMORPHONE HCL 1 MG/ML IJ SOLN
1.0000 mg | Freq: Once | INTRAMUSCULAR | Status: AC
  Administered 2022-05-10: 1 mg via INTRAVENOUS
  Filled 2022-05-10: qty 1

## 2022-05-10 MED ORDER — PANTOPRAZOLE SODIUM 40 MG IV SOLR
40.0000 mg | Freq: Once | INTRAVENOUS | Status: AC
  Administered 2022-05-10: 40 mg via INTRAVENOUS
  Filled 2022-05-10: qty 10

## 2022-05-10 MED ORDER — DEXTROSE IN LACTATED RINGERS 5 % IV SOLN: INTRAVENOUS | Status: DC

## 2022-05-10 MED ORDER — MAGNESIUM SULFATE 2 GM/50ML IV SOLN
2.0000 g | Freq: Once | INTRAVENOUS | Status: AC
  Administered 2022-05-10: 2 g via INTRAVENOUS
  Filled 2022-05-10: qty 50

## 2022-05-10 MED ORDER — DROPERIDOL 2.5 MG/ML IJ SOLN
1.2500 mg | Freq: Once | INTRAMUSCULAR | Status: DC
  Filled 2022-05-10: qty 2

## 2022-05-10 MED ORDER — LACTATED RINGERS IV BOLUS
1000.0000 mL | Freq: Once | INTRAVENOUS | Status: AC
  Administered 2022-05-10: 1000 mL via INTRAVENOUS

## 2022-05-10 MED ORDER — DROPERIDOL 2.5 MG/ML IJ SOLN: 2.5000 mg | Freq: Once | INTRAMUSCULAR | Status: DC

## 2022-05-10 MED ORDER — HYDROMORPHONE HCL 1 MG/ML IJ SOLN
0.5000 mg | Freq: Once | INTRAMUSCULAR | Status: AC
  Administered 2022-05-10: 0.5 mg via INTRAVENOUS
  Filled 2022-05-10: qty 1

## 2022-05-10 NOTE — ED Notes (Signed)
Patient refused PO potassium at this time. Refusing IV access by RN. Requesting IV team.

## 2022-05-10 NOTE — ED Provider Notes (Signed)
[facility] Provider Note   [name]: [id number] Arrival date & time: 05/10/22  1900     History  Chief Complaint  Patient presents with   Abdominal Pain    Deborah [name] is a 57 y.o. female.  With PMH of DM2, CKD, CHF, HTN, gastroparesis who presents with 1 week of ongoing abdominal pain with nausea and vomiting associated with diffuse weakness and inability to ambulate or walk.  She denies any recent falls or injuries.  Denies any headache.  She is complaining of severe pain shooting down her right arm or right leg.  She does have upper abdominal pain associate with nausea and nonbloody nonbilious emesis.  Due to her ongoing abdominal pain and emesis she has not been taking any of her home medications.  She is denying any chest pain or shortness of breath.  She complains of diffuse weakness but worse on the right side.  Unsure if it is more the pain contributing to her worsening weakness.  She had 1 episode of incontinence but that is because she cannot get out of the bed to go to the bathroom.  No saddle anesthesia.   Abdominal Pain      Home Medications Prior to Admission medications   Medication Sig Start Date End Date Taking? Authorizing Provider  albuterol (PROVENTIL) (2.5 MG/3ML) 0.083% nebulizer solution Take 3 mLs (2.5 mg total) by nebulization every 6 (six) hours as needed for wheezing or shortness of breath. 04/06/19   Scot Jun, NP  apixaban (ELIQUIS) 5 MG TABS tablet Take 5 mg by mouth 2 (two) times daily. 12/03/21   [provider]  atorvastatin (LIPITOR) 10 MG tablet Take 10 mg by mouth daily. 12/03/21   [provider]  cetirizine (ZYRTEC) 10 MG tablet TAKE 1 TABLET (10 MG TOTAL) BY MOUTH DAILY (AM) Patient taking differently: Take 10 mg by mouth daily. 06/30/21   Raulkar, Clide Deutscher, MD  DULoxetine HCl 40 MG CPEP Take 40 mg by mouth in the morning and at bedtime. 03/22/22   Shary Key, DO  EASY  COMFORT PEN NEEDLES 31G X 5 MM MISC USE 3 TIMES A DAY FOR INSULIN ADMINISTRATION 11/14/19   Meccariello, Bernita Raisin, MD  famotidine (PEPCID) 20 MG tablet Take 1 tablet (20 mg total) by mouth daily. 03/22/22 07/20/22  Shary Key, DO  fluticasone (FLONASE) 50 MCG/ACT nasal spray Place 2 sprays into both nostrils daily as needed for allergies or rhinitis. Patient taking differently: Place 1 spray into both nostrils daily. 12/19/18   Rai, Vernelle Emerald, MD  folic acid (FOLVITE) 1 MG tablet Take 1 mg by mouth daily. 04/07/21   [provider]  hydrALAZINE (APRESOLINE) 25 MG tablet Take 1 tablet (25 mg total) by mouth 3 (three) times daily. 03/22/22   Shary Key, DO  HYDROmorphone (DILAUDID) 4 MG tablet TAKE 1 TABLET (4 MG TOTAL) BY MOUTH 2 (TWO) TIMES DAILY. MAY TAKE AN EXTRA TABLET WHEN PAIN IS SEVERE 20 DAYS OUT OF THE MONTH *** FILL ON 04/02/2022 *** 04/29/22   Raulkar, Clide Deutscher, MD  insulin lispro (HUMALOG) 100 UNIT/ML KwikPen Inject 7 Units into the skin 3 (three) times daily with meals. Patient taking differently: Inject 30 Units into the skin in the morning, at noon, in the evening, and at bedtime. 08/28/21   Gaylan Gerold, DO  isosorbide mononitrate (IMDUR) 30 MG 24 hr tablet Take 0.5 tablets (15 mg total) by mouth daily. 03/22/22  Arby Barrette, [place], DO  LANTUS SOLOSTAR 100 UNIT/ML Solostar Pen Inject 35 Units into the skin at bedtime. Patient taking differently: Inject 50 Units into the skin at bedtime. 08/28/21   Gaylan Gerold, DO  linaclotide [facility]) 145 MCG CAPS capsule Take 1 capsule (145 mcg total) by mouth daily before breakfast. 03/22/22 04/21/22  Shary Key, DO  magnesium chloride (SLOW-MAG) 64 MG TBEC SR tablet Take 1 tablet by mouth daily. 12/07/21   [provider]  meclizine (ANTIVERT) 25 MG tablet Take 1 tablet (25 mg total) by mouth 3 (three) times daily as needed for dizziness. 02/20/21   Raulkar, Clide Deutscher, MD  metoprolol succinate (TOPROL-XL) 50 MG 24 hr  tablet Take 1 tablet (50 mg total) by mouth daily. Take with or immediately following a meal. 11/24/20 03/18/23  Enzo Bi, MD  ondansetron (ZOFRAN ODT) 4 MG disintegrating tablet Take 1 tablet (4 mg total) by mouth every 8 (eight) hours as needed for nausea or vomiting. 05/30/21   Sherwood Gambler, MD  pantoprazole (PROTONIX) 40 MG tablet TAKE 1 TABLET BY MOUTH 2 (TWO) TIMES DAILY. (AM+BEDTIME) Patient taking differently: Take 40 mg by mouth 2 (two) times daily. 03/11/22   Raulkar, Clide Deutscher, MD  SENNA PLUS 8.6-50 MG tablet Take 1 tablet by mouth daily. 05/19/21   [provider]  sodium bicarbonate 650 MG tablet Take 1 tablet (650 mg total) by mouth 2 (two) times daily. 04/20/21   Jennye Boroughs, MD  torsemide (DEMADEX) 20 MG tablet Take 2 tablets by mouth daily. 12/03/21   [provider]  Vitamin D, Ergocalciferol, (DRISDOL) 1.25 MG (50000 UNIT) CAPS capsule Take 50,000 Units by mouth every Monday. 04/07/21   [provider]  NOVOLOG FLEXPEN 100 UNIT/ML FlexPen Inject 7 Units into the skin 3 (three) times daily with meals. 08/28/21 08/28/21  Gaylan Gerold, DO      Allergies    Diazepam, Gabapentin, Iodinated contrast media, Isovue [iopamidol], Lisinopril, Metoclopramide, Nsaids, Penicillins, Dicyclomine hcl, Tolmetin, Rifamycins, Acetaminophen, Cyclobenzaprine, Oxycodone, and Tramadol    Review of Systems   Review of Systems  Gastrointestinal:  Positive for abdominal pain.    Physical Exam Updated Vital Signs BP 121/79   Pulse (!) 111   Temp (!) 97.3 F (36.3 C) (Oral)   Resp 16   Ht '5\' 6"'$  (1.676 m)   Wt (!) 154.2 kg   LMP 10/10/2012   SpO2 99%   BMI 54.88 kg/m  Physical Exam Constitutional: Alert and oriented.  Uncomfortable but nontoxic Eyes: Conjunctivae are normal. ENT      Head: Normocephalic and atraumatic. Cardiovascular: S1, S2, tachycardic, regular rhythm, warm well-perfused Respiratory: Breath sounds clear, O2 sat 99 on RA Gastrointestinal: Soft and  nontender. Musculoskeletal: Normal range of motion in all extremities. No pitting edema of lower extremities Neurologic: Normal speech and language.  No facial droop.  PERRL.  EOMI.  4+ out of 5 strength diffusely.  No gross focal neurologic deficits are appreciated. Skin: Skin is warm, dry and intact. No rash noted. Psychiatric: Mood and affect are normal. Speech and behavior are normal.  ED Results / Procedures / Treatments   Labs (all labs ordered are listed, but only abnormal results are displayed) Labs Reviewed  CBC WITH DIFFERENTIAL/PLATELET - Abnormal; Notable for the following components:      Result Value   WBC 15.4 (*)    RBC 2.71 (*)    Hemoglobin 7.7 (*)    HCT 25.2 (*)    Platelets 502 (*)  Neutro Abs 12.8 (*)    Abs Immature Granulocytes 0.08 (*)    All other components within normal limits  COMPREHENSIVE METABOLIC PANEL - Abnormal; Notable for the following components:   Sodium 127 (*)    Chloride 93 (*)    CO2 21 (*)    Glucose, Bld 540 (*)    BUN 82 (*)    Creatinine, Ser 3.67 (*)    Total Protein 8.3 (*)    Albumin 2.8 (*)    AST 14 (*)    GFR, Estimated 14 (*)    All other components within normal limits  MAGNESIUM - Abnormal; Notable for the following components:   Magnesium 1.6 (*)    All other components within normal limits  CBG MONITORING, ED - Abnormal; Notable for the following components:   Glucose-Capillary 521 (*)    All other components within normal limits  I-STAT VENOUS BLOOD GAS, ED - Abnormal; Notable for the following components:   pCO2, Ven 35.7 (*)    pO2, Ven 22 (*)    Acid-base deficit 3.0 (*)    Sodium 128 (*)    HCT 27.0 (*)    Hemoglobin 9.2 (*)    All other components within normal limits  RESP PANEL BY RT-PCR (RSV, FLU A&B, COVID)  RVPGX2  ETHANOL  LIPASE, BLOOD  BRAIN NATRIURETIC PEPTIDE  PHOSPHORUS  URINALYSIS, ROUTINE W REFLEX MICROSCOPIC  RAPID URINE DRUG SCREEN, HOSP PERFORMED  BLOOD GAS, VENOUS  TROPONIN I  (HIGH SENSITIVITY)  TROPONIN I (HIGH SENSITIVITY)    EKG EKG Interpretation  Date/Time:  Monday May 10 2022 19:08:36 EST Ventricular Rate:  114 PR Interval:  177 QRS Duration: 90 QT Interval:  329 QTC Calculation: 453 R Axis:   -22 Text Interpretation: Sinus tachycardia Borderline left axis deviation Abnormal R-wave progression, late transition Confirmed by Georgina Snell [phone]) on 05/10/2022 7:22:35 PM  Radiology CT Head Wo Contrast  Result Date: 05/10/2022 CLINICAL DATA:  Weakness and pain, initial encounter EXAM: CT HEAD WITHOUT CONTRAST CT CERVICAL SPINE WITHOUT CONTRAST TECHNIQUE: Multidetector CT imaging of the head and cervical spine was performed following the standard protocol without intravenous contrast. Multiplanar CT image reconstructions of the cervical spine were also generated. RADIATION DOSE REDUCTION: This exam was performed according to the departmental dose-optimization program which includes automated exposure control, adjustment of the mA and/or kV according to patient size and/or use of iterative reconstruction technique. COMPARISON:  None Available. FINDINGS: CT HEAD FINDINGS Brain: No evidence of acute infarction, hemorrhage, hydrocephalus, extra-axial collection or mass lesion/mass effect. Mild atrophic changes are noted. Vascular: No hyperdense vessel or unexpected calcification. Skull: Normal. Negative for fracture or focal lesion. Sinuses/Orbits: No acute finding. Other: None. CT CERVICAL SPINE FINDINGS Alignment: Mild straightening of the normal cervical lordosis is noted. Skull base and vertebrae: In cervical segments are well visualized. Vertebral body height is well maintained. No acute fracture or acute facet abnormality is noted. Mild osteophytic changes are noted worst at C5-6. Posterior fusion defect is noted at C1 posteriorly felt to be congenital in nature. The odontoid is within normal limits. Soft tissues and spinal canal: Surrounding soft tissue  structures are within normal limits. Upper chest: Visualized lung apices are within normal limits. Other: None IMPRESSION: CT of the head: No acute intracranial abnormality noted. Mild atrophic changes are noted. CT of the cervical spine: Mild degenerative change without acute abnormality. Electronically Signed   By: Inez Catalina M.D.   On: 05/10/2022 23:21   CT Cervical Spine Wo  Contrast  Result Date: 05/10/2022 CLINICAL DATA:  Weakness and pain, initial encounter EXAM: CT HEAD WITHOUT CONTRAST CT CERVICAL SPINE WITHOUT CONTRAST TECHNIQUE: Multidetector CT imaging of the head and cervical spine was performed following the standard protocol without intravenous contrast. Multiplanar CT image reconstructions of the cervical spine were also generated. RADIATION DOSE REDUCTION: This exam was performed according to the departmental dose-optimization program which includes automated exposure control, adjustment of the mA and/or kV according to patient size and/or use of iterative reconstruction technique. COMPARISON:  None Available. FINDINGS: CT HEAD FINDINGS Brain: No evidence of acute infarction, hemorrhage, hydrocephalus, extra-axial collection or mass lesion/mass effect. Mild atrophic changes are noted. Vascular: No hyperdense vessel or unexpected calcification. Skull: Normal. Negative for fracture or focal lesion. Sinuses/Orbits: No acute finding. Other: None. CT CERVICAL SPINE FINDINGS Alignment: Mild straightening of the normal cervical lordosis is noted. Skull base and vertebrae: In cervical segments are well visualized. Vertebral body height is well maintained. No acute fracture or acute facet abnormality is noted. Mild osteophytic changes are noted worst at C5-6. Posterior fusion defect is noted at C1 posteriorly felt to be congenital in nature. The odontoid is within normal limits. Soft tissues and spinal canal: Surrounding soft tissue structures are within normal limits. Upper chest: Visualized lung apices  are within normal limits. Other: None IMPRESSION: CT of the head: No acute intracranial abnormality noted. Mild atrophic changes are noted. CT of the cervical spine: Mild degenerative change without acute abnormality. Electronically Signed   By: Inez Catalina M.D.   On: 05/10/2022 23:21    Procedures Procedures  Remain on constant cardiac monitoring sinus tachycardia improved to sinus rhythm with normal rates  Medications Ordered in ED Medications  insulin regular, human (MYXREDLIN) 100 units/ 100 mL infusion (has no administration in time range)  lactated ringers infusion (has no administration in time range)  dextrose 5 % in lactated ringers infusion (has no administration in time range)  dextrose 50 % solution 0-50 mL (has no administration in time range)  potassium chloride SA (KLOR-CON M) CR tablet 40 mEq (has no administration in time range)  magnesium sulfate IVPB 2 g 50 mL (has no administration in time range)  lactated ringers bolus 1,000 mL (1,000 mLs Intravenous New Bag/Given 05/10/22 2049)  pantoprazole (PROTONIX) injection 40 mg (40 mg Intravenous Given 05/10/22 2053)  HYDROmorphone (DILAUDID) injection 1 mg (1 mg Intravenous Given 05/10/22 2049)  ondansetron (ZOFRAN) injection 4 mg (4 mg Intravenous Given 05/10/22 2052)    ED Course/ Medical Decision Making/ A&P   {                            Medical Decision Making Amount and/or Complexity of Data Reviewed Labs: ordered. Radiology: ordered.  Risk Prescription drug management.      Final Clinical Impression(s) / ED Diagnoses Final diagnoses:  Abdominal pain, unspecified abdominal location  Hyperglycemia  AKI (acute kidney injury) ([place])  Hypomagnesemia    Rx / DC Orders ED Discharge Orders     None

## 2022-05-10 NOTE — ED Triage Notes (Addendum)
BIB EMS from home, c/o abd pain n/v. Pain on back of right leg to neck. Uses wheelchair. Aaox4. Denies chest pain, sob. States has not had an appetite for 2 wks.

## 2022-05-10 NOTE — Progress Notes (Signed)
Assessed this patient for PIV placement. Spoke with MD/nurse regarding POC. At this time patient has currently working PIV. Elmo Putt, RN, denies and need for further PIV access at this time. No current infusions ordered. Fran Lowes RN VAST

## 2022-05-11 ENCOUNTER — Observation Stay (HOSPITAL_COMMUNITY): Payer: 59

## 2022-05-11 DIAGNOSIS — K5909 Other constipation: Secondary | ICD-10-CM | POA: Diagnosis not present

## 2022-05-11 DIAGNOSIS — D649 Anemia, unspecified: Secondary | ICD-10-CM | POA: Diagnosis not present

## 2022-05-11 DIAGNOSIS — M48062 Spinal stenosis, lumbar region with neurogenic claudication: Secondary | ICD-10-CM | POA: Diagnosis present

## 2022-05-11 DIAGNOSIS — I5042 Chronic combined systolic (congestive) and diastolic (congestive) heart failure: Secondary | ICD-10-CM | POA: Diagnosis present

## 2022-05-11 DIAGNOSIS — M797 Fibromyalgia: Secondary | ICD-10-CM | POA: Diagnosis present

## 2022-05-11 DIAGNOSIS — K219 Gastro-esophageal reflux disease without esophagitis: Secondary | ICD-10-CM

## 2022-05-11 DIAGNOSIS — E1100 Type 2 diabetes mellitus with hyperosmolarity without nonketotic hyperglycemic-hyperosmolar coma (NKHHC): Secondary | ICD-10-CM | POA: Diagnosis present

## 2022-05-11 DIAGNOSIS — I130 Hypertensive heart and chronic kidney disease with heart failure and stage 1 through stage 4 chronic kidney disease, or unspecified chronic kidney disease: Secondary | ICD-10-CM | POA: Diagnosis present

## 2022-05-11 DIAGNOSIS — E1143 Type 2 diabetes mellitus with diabetic autonomic (poly)neuropathy: Secondary | ICD-10-CM

## 2022-05-11 DIAGNOSIS — Z7901 Long term (current) use of anticoagulants: Secondary | ICD-10-CM | POA: Diagnosis not present

## 2022-05-11 DIAGNOSIS — Z6841 Body Mass Index (BMI) 40.0 and over, adult: Secondary | ICD-10-CM | POA: Diagnosis not present

## 2022-05-11 DIAGNOSIS — I428 Other cardiomyopathies: Secondary | ICD-10-CM | POA: Diagnosis present

## 2022-05-11 DIAGNOSIS — I5022 Chronic systolic (congestive) heart failure: Secondary | ICD-10-CM

## 2022-05-11 DIAGNOSIS — R52 Pain, unspecified: Secondary | ICD-10-CM | POA: Diagnosis not present

## 2022-05-11 DIAGNOSIS — R109 Unspecified abdominal pain: Secondary | ICD-10-CM | POA: Diagnosis not present

## 2022-05-11 DIAGNOSIS — E871 Hypo-osmolality and hyponatremia: Secondary | ICD-10-CM | POA: Diagnosis present

## 2022-05-11 DIAGNOSIS — K269 Duodenal ulcer, unspecified as acute or chronic, without hemorrhage or perforation: Secondary | ICD-10-CM | POA: Diagnosis present

## 2022-05-11 DIAGNOSIS — E876 Hypokalemia: Secondary | ICD-10-CM | POA: Diagnosis present

## 2022-05-11 DIAGNOSIS — Z79899 Other long term (current) drug therapy: Secondary | ICD-10-CM | POA: Diagnosis not present

## 2022-05-11 DIAGNOSIS — R112 Nausea with vomiting, unspecified: Secondary | ICD-10-CM | POA: Diagnosis not present

## 2022-05-11 DIAGNOSIS — I480 Paroxysmal atrial fibrillation: Secondary | ICD-10-CM | POA: Diagnosis present

## 2022-05-11 DIAGNOSIS — N184 Chronic kidney disease, stage 4 (severe): Secondary | ICD-10-CM | POA: Diagnosis present

## 2022-05-11 DIAGNOSIS — E1122 Type 2 diabetes mellitus with diabetic chronic kidney disease: Secondary | ICD-10-CM | POA: Diagnosis present

## 2022-05-11 DIAGNOSIS — N179 Acute kidney failure, unspecified: Secondary | ICD-10-CM | POA: Diagnosis present

## 2022-05-11 DIAGNOSIS — N1832 Chronic kidney disease, stage 3b: Secondary | ICD-10-CM | POA: Diagnosis not present

## 2022-05-11 DIAGNOSIS — C7A010 Malignant carcinoid tumor of the duodenum: Secondary | ICD-10-CM | POA: Diagnosis present

## 2022-05-11 DIAGNOSIS — I10 Essential (primary) hypertension: Secondary | ICD-10-CM | POA: Diagnosis not present

## 2022-05-11 DIAGNOSIS — D631 Anemia in chronic kidney disease: Secondary | ICD-10-CM | POA: Diagnosis present

## 2022-05-11 DIAGNOSIS — Z794 Long term (current) use of insulin: Secondary | ICD-10-CM | POA: Diagnosis not present

## 2022-05-11 DIAGNOSIS — Z1152 Encounter for screening for COVID-19: Secondary | ICD-10-CM | POA: Diagnosis not present

## 2022-05-11 DIAGNOSIS — M7989 Other specified soft tissue disorders: Secondary | ICD-10-CM | POA: Diagnosis not present

## 2022-05-11 DIAGNOSIS — K3184 Gastroparesis: Secondary | ICD-10-CM

## 2022-05-11 DIAGNOSIS — E785 Hyperlipidemia, unspecified: Secondary | ICD-10-CM | POA: Diagnosis present

## 2022-05-11 DIAGNOSIS — E11649 Type 2 diabetes mellitus with hypoglycemia without coma: Secondary | ICD-10-CM | POA: Diagnosis not present

## 2022-05-11 DIAGNOSIS — D72829 Elevated white blood cell count, unspecified: Secondary | ICD-10-CM | POA: Diagnosis present

## 2022-05-11 HISTORY — DX: Type 2 diabetes mellitus with hyperosmolarity without nonketotic hyperglycemic-hyperosmolar coma (NKHHC): E11.00

## 2022-05-11 LAB — GLUCOSE, CAPILLARY
Glucose-Capillary: 133 mg/dL — ABNORMAL HIGH (ref 70–99)
Glucose-Capillary: 152 mg/dL — ABNORMAL HIGH (ref 70–99)
Glucose-Capillary: 159 mg/dL — ABNORMAL HIGH (ref 70–99)
Glucose-Capillary: 159 mg/dL — ABNORMAL HIGH (ref 70–99)
Glucose-Capillary: 205 mg/dL — ABNORMAL HIGH (ref 70–99)
Glucose-Capillary: 204 mg/dL — ABNORMAL HIGH (ref 70–99)
Glucose-Capillary: 329 mg/dL — ABNORMAL HIGH (ref 70–99)
Glucose-Capillary: 333 mg/dL — ABNORMAL HIGH (ref 70–99)

## 2022-05-11 LAB — CBC
HCT: 23.4 % — ABNORMAL LOW (ref 36.0–46.0)
HCT: 23.5 % — ABNORMAL LOW (ref 36.0–46.0)
Hemoglobin: 7.5 g/dL — ABNORMAL LOW (ref 12.0–15.0)
Hemoglobin: 7.4 g/dL — ABNORMAL LOW (ref 12.0–15.0)
MCH: 28.7 pg (ref 26.0–34.0)
MCH: 28.5 pg (ref 26.0–34.0)
MCHC: 32.1 g/dL (ref 30.0–36.0)
MCHC: 31.5 g/dL (ref 30.0–36.0)
MCV: 89.7 fL (ref 80.0–100.0)
MCV: 90.4 fL (ref 80.0–100.0)
Platelets: 451 10*3/uL — ABNORMAL HIGH (ref 150–400)
Platelets: 496 10*3/uL — ABNORMAL HIGH (ref 150–400)
RBC: 2.61 MIL/uL — ABNORMAL LOW (ref 3.87–5.11)
RBC: 2.6 MIL/uL — ABNORMAL LOW (ref 3.87–5.11)
RDW: 13.5 % (ref 11.5–15.5)
RDW: 13.6 % (ref 11.5–15.5)
WBC: 14.2 10*3/uL — ABNORMAL HIGH (ref 4.0–10.5)
WBC: 16.3 10*3/uL — ABNORMAL HIGH (ref 4.0–10.5)
nRBC: 0 % (ref 0.0–0.2)
nRBC: 0 % (ref 0.0–0.2)

## 2022-05-11 LAB — LACTIC ACID, PLASMA
Lactic Acid, Venous: 1.8 mmol/L (ref 0.5–1.9)
Lactic Acid, Venous: 1.1 mmol/L (ref 0.5–1.9)

## 2022-05-11 LAB — URINALYSIS, ROUTINE W REFLEX MICROSCOPIC
Bilirubin Urine: NEGATIVE
Glucose, UA: 150 mg/dL — AB
Ketones, ur: NEGATIVE mg/dL
Leukocytes,Ua: NEGATIVE
Nitrite: NEGATIVE
Protein, ur: 100 mg/dL — AB
Specific Gravity, Urine: 1.012 (ref 1.005–1.030)
pH: 5 (ref 5.0–8.0)

## 2022-05-11 LAB — CK: Total CK: 140 U/L (ref 38–234)

## 2022-05-11 LAB — RETICULOCYTES
Immature Retic Fract: 19.7 % — ABNORMAL HIGH (ref 2.3–15.9)
RBC.: 2.58 MIL/uL — ABNORMAL LOW (ref 3.87–5.11)
Retic Count, Absolute: 66 10*3/uL (ref 19.0–186.0)
Retic Ct Pct: 2.6 % (ref 0.4–3.1)

## 2022-05-11 LAB — BASIC METABOLIC PANEL
Anion gap: 11 (ref 5–15)
BUN: 76 mg/dL — ABNORMAL HIGH (ref 6–20)
CO2: 21 mmol/L — ABNORMAL LOW (ref 22–32)
Calcium: 9.2 mg/dL (ref 8.9–10.3)
Chloride: 95 mmol/L — ABNORMAL LOW (ref 98–111)
Creatinine, Ser: 3.29 mg/dL — ABNORMAL HIGH (ref 0.44–1.00)
GFR, Estimated: 16 mL/min — ABNORMAL LOW (ref >60)
Glucose, Bld: 422 mg/dL — ABNORMAL HIGH (ref 70–99)
Potassium: 4 mmol/L (ref 3.5–5.1)
Sodium: 127 mmol/L — ABNORMAL LOW (ref 135–145)

## 2022-05-11 LAB — IRON AND TIBC
Iron: 30 ug/dL (ref 28–170)
Saturation Ratios: 11 % (ref 10.4–31.8)
TIBC: 266 ug/dL (ref 250–450)
UIBC: 236 ug/dL

## 2022-05-11 LAB — COMPREHENSIVE METABOLIC PANEL
ALT: 7 U/L (ref 0–44)
AST: 11 U/L — ABNORMAL LOW (ref 15–41)
Albumin: 2.6 g/dL — ABNORMAL LOW (ref 3.5–5.0)
Alkaline Phosphatase: 80 U/L (ref 38–126)
Anion gap: 16 — ABNORMAL HIGH (ref 5–15)
BUN: 73 mg/dL — ABNORMAL HIGH (ref 6–20)
CO2: 17 mmol/L — ABNORMAL LOW (ref 22–32)
Calcium: 9.5 mg/dL (ref 8.9–10.3)
Chloride: 101 mmol/L (ref 98–111)
Creatinine, Ser: 3.44 mg/dL — ABNORMAL HIGH (ref 0.44–1.00)
GFR, Estimated: 15 mL/min — ABNORMAL LOW (ref >60)
Glucose, Bld: 158 mg/dL — ABNORMAL HIGH (ref 70–99)
Potassium: 3.9 mmol/L (ref 3.5–5.1)
Sodium: 134 mmol/L — ABNORMAL LOW (ref 135–145)
Total Bilirubin: 0.6 mg/dL (ref 0.3–1.2)
Total Protein: 7.8 g/dL (ref 6.5–8.1)

## 2022-05-11 LAB — MAGNESIUM: Magnesium: 1.9 mg/dL (ref 1.7–2.4)

## 2022-05-11 LAB — FERRITIN: Ferritin: 825 ng/mL — ABNORMAL HIGH (ref 11–307)

## 2022-05-11 LAB — CBG MONITORING, ED
Glucose-Capillary: 173 mg/dL — ABNORMAL HIGH (ref 70–99)
Glucose-Capillary: 294 mg/dL — ABNORMAL HIGH (ref 70–99)
Glucose-Capillary: 348 mg/dL — ABNORMAL HIGH (ref 70–99)
Glucose-Capillary: 413 mg/dL — ABNORMAL HIGH (ref 70–99)

## 2022-05-11 LAB — VITAMIN B12: Vitamin B-12: 295 pg/mL (ref 180–914)

## 2022-05-11 LAB — PHOSPHORUS: Phosphorus: 2.7 mg/dL (ref 2.5–4.6)

## 2022-05-11 LAB — TSH: TSH: 1.39 u[IU]/mL (ref 0.350–4.500)

## 2022-05-11 LAB — TROPONIN I (HIGH SENSITIVITY): Troponin I (High Sensitivity): 14 ng/L (ref <18)

## 2022-05-11 LAB — FOLATE: Folate: 9.5 ng/mL (ref >5.9)

## 2022-05-11 LAB — PROCALCITONIN: Procalcitonin: 0.18 ng/mL

## 2022-05-11 MED ORDER — SODIUM BICARBONATE 650 MG PO TABS
650.0000 mg | ORAL_TABLET | Freq: Two times a day (BID) | ORAL | Status: DC
  Administered 2022-05-11 – 2022-05-27 (×30): 650 mg via ORAL
  Filled 2022-05-11 (×34): qty 1

## 2022-05-11 MED ORDER — PROSOURCE PLUS PO LIQD
30.0000 mL | Freq: Two times a day (BID) | ORAL | Status: DC
  Administered 2022-05-17: 30 mL via ORAL
  Filled 2022-05-11 (×5): qty 30

## 2022-05-11 MED ORDER — ADULT MULTIVITAMIN W/MINERALS CH
1.0000 | ORAL_TABLET | Freq: Every day | ORAL | Status: DC
  Administered 2022-05-11 – 2022-05-27 (×16): 1 via ORAL
  Filled 2022-05-11 (×17): qty 1

## 2022-05-11 MED ORDER — SENNOSIDES-DOCUSATE SODIUM 8.6-50 MG PO TABS
1.0000 | ORAL_TABLET | Freq: Every day | ORAL | Status: DC
  Administered 2022-05-12: 1 via ORAL
  Filled 2022-05-11 (×8): qty 1

## 2022-05-11 MED ORDER — LORATADINE 10 MG PO TABS
10.0000 mg | ORAL_TABLET | Freq: Every day | ORAL | Status: DC
  Administered 2022-05-11 – 2022-05-27 (×16): 10 mg via ORAL
  Filled 2022-05-11 (×17): qty 1

## 2022-05-11 MED ORDER — APIXABAN 5 MG PO TABS
5.0000 mg | ORAL_TABLET | Freq: Two times a day (BID) | ORAL | Status: DC
  Administered 2022-05-11 – 2022-05-21 (×21): 5 mg via ORAL
  Filled 2022-05-11 (×22): qty 1

## 2022-05-11 MED ORDER — ALBUTEROL SULFATE (2.5 MG/3ML) 0.083% IN NEBU: 2.5000 mg | INHALATION_SOLUTION | Freq: Four times a day (QID) | RESPIRATORY_TRACT | Status: DC | PRN

## 2022-05-11 MED ORDER — SODIUM CHLORIDE 0.9 % IV SOLN: INTRAVENOUS | Status: AC

## 2022-05-11 MED ORDER — LORAZEPAM 1 MG PO TABS: 2.0000 mg | ORAL_TABLET | Freq: Once | ORAL | Status: DC | PRN

## 2022-05-11 MED ORDER — ATORVASTATIN CALCIUM 10 MG PO TABS
10.0000 mg | ORAL_TABLET | Freq: Every day | ORAL | Status: DC
  Administered 2022-05-11 – 2022-05-27 (×16): 10 mg via ORAL
  Filled 2022-05-11 (×17): qty 1

## 2022-05-11 MED ORDER — DIAZEPAM 5 MG/ML IJ SOLN: 10.0000 mg | Freq: Once | INTRAMUSCULAR | Status: DC

## 2022-05-11 MED ORDER — PANTOPRAZOLE SODIUM 40 MG PO TBEC
40.0000 mg | DELAYED_RELEASE_TABLET | Freq: Two times a day (BID) | ORAL | Status: DC
  Administered 2022-05-11 – 2022-05-27 (×31): 40 mg via ORAL
  Filled 2022-05-11 (×34): qty 1

## 2022-05-11 MED ORDER — INSULIN ASPART 100 UNIT/ML IJ SOLN
0.0000 [IU] | Freq: Every day | INTRAMUSCULAR | Status: DC
  Administered 2022-05-11: 4 [IU] via SUBCUTANEOUS
  Administered 2022-05-12: 3 [IU] via SUBCUTANEOUS
  Administered 2022-05-13 – 2022-05-14 (×2): 5 [IU] via SUBCUTANEOUS
  Administered 2022-05-15 – 2022-05-21 (×3): 2 [IU] via SUBCUTANEOUS

## 2022-05-11 MED ORDER — INSULIN GLARGINE-YFGN 100 UNIT/ML ~~LOC~~ SOLN
20.0000 [IU] | SUBCUTANEOUS | Status: DC
  Filled 2022-05-11: qty 0.2

## 2022-05-11 MED ORDER — POLYETHYLENE GLYCOL 3350 17 G PO PACK: 17.0000 g | PACK | Freq: Every day | ORAL | Status: DC | PRN

## 2022-05-11 MED ORDER — METOPROLOL SUCCINATE ER 50 MG PO TB24
50.0000 mg | ORAL_TABLET | Freq: Every day | ORAL | Status: DC
  Administered 2022-05-11 – 2022-05-27 (×16): 50 mg via ORAL
  Filled 2022-05-11 (×17): qty 1

## 2022-05-11 MED ORDER — BOOST / RESOURCE BREEZE PO LIQD CUSTOM: 1.0000 | Freq: Two times a day (BID) | ORAL | Status: DC

## 2022-05-11 MED ORDER — INSULIN ASPART 100 UNIT/ML IJ SOLN
0.0000 [IU] | Freq: Three times a day (TID) | INTRAMUSCULAR | Status: DC
  Administered 2022-05-11: 15 [IU] via SUBCUTANEOUS
  Administered 2022-05-12: 7 [IU] via SUBCUTANEOUS
  Administered 2022-05-12: 11 [IU] via SUBCUTANEOUS
  Administered 2022-05-12: 15 [IU] via SUBCUTANEOUS
  Administered 2022-05-13: 20 [IU] via SUBCUTANEOUS
  Administered 2022-05-13 (×2): 15 [IU] via SUBCUTANEOUS
  Administered 2022-05-14: 11 [IU] via SUBCUTANEOUS
  Administered 2022-05-14: 20 [IU] via SUBCUTANEOUS
  Administered 2022-05-14: 7 [IU] via SUBCUTANEOUS
  Administered 2022-05-15: 15 [IU] via SUBCUTANEOUS
  Administered 2022-05-15: 20 [IU] via SUBCUTANEOUS
  Administered 2022-05-15: 11 [IU] via SUBCUTANEOUS
  Administered 2022-05-16: 3 [IU] via SUBCUTANEOUS
  Administered 2022-05-16: 20 [IU] via SUBCUTANEOUS
  Administered 2022-05-16 – 2022-05-17 (×2): 11 [IU] via SUBCUTANEOUS
  Administered 2022-05-17: 3 [IU] via SUBCUTANEOUS
  Administered 2022-05-18: 4 [IU] via SUBCUTANEOUS
  Administered 2022-05-18 – 2022-05-19 (×3): 3 [IU] via SUBCUTANEOUS
  Administered 2022-05-21: 4 [IU] via SUBCUTANEOUS
  Administered 2022-05-22 (×2): 7 [IU] via SUBCUTANEOUS
  Administered 2022-05-22 – 2022-05-23 (×3): 11 [IU] via SUBCUTANEOUS
  Administered 2022-05-24: 7 [IU] via SUBCUTANEOUS
  Administered 2022-05-24: 4 [IU] via SUBCUTANEOUS
  Administered 2022-05-24: 3 [IU] via SUBCUTANEOUS
  Administered 2022-05-25: 7 [IU] via SUBCUTANEOUS
  Administered 2022-05-25: 3 [IU] via SUBCUTANEOUS
  Administered 2022-05-25: 7 [IU] via SUBCUTANEOUS
  Administered 2022-05-26: 15 [IU] via SUBCUTANEOUS
  Administered 2022-05-26: 3 [IU] via SUBCUTANEOUS
  Administered 2022-05-26: 4 [IU] via SUBCUTANEOUS
  Administered 2022-05-27: 7 [IU] via SUBCUTANEOUS
  Administered 2022-05-27: 4 [IU] via SUBCUTANEOUS

## 2022-05-11 MED ORDER — DIAZEPAM 5 MG/ML IJ SOLN
10.0000 mg | Freq: Once | INTRAMUSCULAR | Status: AC | PRN
  Administered 2022-05-12: 10 mg via INTRAVENOUS
  Filled 2022-05-11: qty 2

## 2022-05-11 MED ORDER — INSULIN GLARGINE-YFGN 100 UNIT/ML ~~LOC~~ SOLN
30.0000 [IU] | SUBCUTANEOUS | Status: AC
  Administered 2022-05-11: 30 [IU] via SUBCUTANEOUS
  Filled 2022-05-11: qty 0.3

## 2022-05-11 MED ORDER — HYDROMORPHONE HCL 2 MG PO TABS
4.0000 mg | ORAL_TABLET | Freq: Four times a day (QID) | ORAL | Status: DC | PRN
  Administered 2022-05-11 – 2022-05-20 (×31): 4 mg via ORAL
  Filled 2022-05-11 (×33): qty 2

## 2022-05-11 NOTE — Plan of Care (Signed)

## 2022-05-11 NOTE — ED Notes (Signed)
Per Dr Roel Cluck, LR  with dextrose is not to be given with BS reaches 250 or below. Advise to reach out to floor coverage for further instructions on insulin drip.

## 2022-05-11 NOTE — Progress Notes (Signed)
[facility] Progress Note  Patient: Deborah [name]    [id number]  DOA: 05/10/2022    Date of Service: the patient was seen and examined on 05/11/2022  Brief hospital course: Patient is a 57 year old female with past medical history of morbid obesity, spinal stenosis uncontrolled diabetes mellitus type 2 with gastroparesis and stage IV chronic kidney disease, atrial fibrillation, hypertension and carcinoid tumor of the duodenum who presented to the emergency room on the evening of 3/4 with 1 week of abdominal pain, nausea and vomiting.  In the emergency room, patient found to have blood sugar of 540 and nonketotic hyperglycemia.  Patient started on IV fluids and insulin drip.  In addition, she reported to the admitting physician that she was having significant pain in her lower extremities which is now much worse even when she tried to pivot in and out of her wheelchair.  Patient was admitted to the hospitalist service.   Assessment and Plan: * Hyperosmolar non-ketotic state due to type 2 diabetes mellitus (HCC) A1c about 2 months ago at 10.1.  When asked if she takes her insulin consistently, patient states most of the time.  Initial insulin drip and able to be weaned off.  Discussed with diabetes coordinator.  Patient normally takes 50 units of Lantus at home nightly.  Have started 30 units at 12 noon on 3/5.  Would favor increasing Lantus on 3/6 with next dose to be given in mid afternoon around 2 to 3 PM.  Goal is to titrate up Lantus to home dose and slowly give further out until she is on nightly insulin.  Intractable nausea and vomiting-resolved as of 05/11/2022 Improved.  Advancing diet to carb modified.  Suspect this may have more secondary to nonketotic hyperglycemia rather than diabetic gastroparesis.  Spinal stenosis, lumbar region with neurogenic claudication Unclear etiology of worsening pain.  Patient already in wheelchair, but she states pain now so severe she can barely  even moved to pivot in wheelchair.  Unable to get lumbar MRI today, due to miscommunication and concerns for anxiety.  Had extensive discussion with patient and she is quite amenable to IV Valium tomorrow and MRI.  Leukocytosis White blood cell count of 15.4 on admission.  Has not really changed, but no source of infection noted.  More likely stress margination.  Procalcitonin only minimally elevated.  Favor rechecking labs in the morning.  PAF (paroxysmal atrial fibrillation) (HCC) Metoprolol held on admission due to hypotension from hyperglycemia.  Now that she is more fluid replete, heart rate starting to trend upward.  Already on Eliquis.  Restarted metoprolol.  Chronic kidney disease (CKD), stage IV (severe) (HCC) Secondary diabetes mellitus, at baseline  Malignant carcinoid tumor of duodenum [facility]) Outpatient with oncology  Normocytic anemia Secondary to chronic renal disease.  Patient's baseline is around 7.5, currently stable.  She had an isolated erroneous lab on night of 3/4 at 9.2  Hypomagnesemia and hypokalemia Secondary to electrolyte shifts with hyperglycemia.  Replacing as needed.  Please note that hyponatremia is pseudohyponatremia in the context of hyperglycemia  GERD (gastroesophageal reflux disease) Continue PPI  Morbid obesity (HCC) Meets criteria BMI greater than 40       Body mass index is 47.63 kg/m.  Nutrition Problem: Inadequate oral intake Etiology: acute illness, decreased appetite     Consultants: None   Procedures: None  Antimicrobials: None  Code Status: Full code   Subjective: Complains of hip pain  Objective: Mild tachycardia Vitals:   05/11/22 0501 05/11/22 0735  BP: 119/79 126/62  Pulse: (!) 104   Resp: 16 20  Temp: (!) 97.5 F (36.4 C) (!) 97.5 F (36.4 C)  SpO2: 100% 100%    Intake/Output Summary (Last 24 hours) at 05/11/2022 1605 Last data filed at 05/11/2022 1400 Gross per 24 hour  Intake 260.27 ml  Output 250 ml   Net 10.27 ml   Filed Weights   05/10/22 1919 05/11/22 0501  Weight: (!) 154.2 kg 133.8 kg   Body mass index is 47.63 kg/m.  Exam:  General: Alert and oriented x 3, mildly anxious HEENT: Normocephalic, atraumatic, mucous membranes are moist Cardiovascular: Irregular rhythm, borderline tachycardia Respiratory: Clear to auscultation bilaterally Abdomen: Soft, nontender, nondistended, positive bowel sounds, hypoactive Musculoskeletal: No clubbing or cyanosis or edema Skin: No skin breaks, tears or lesions Psychiatry: Slightly anxious, but appropriate, no evidence of psychoses Neurology: Lower extremity peripheral neuropathy, decreased muscle tone, but otherwise no focal deficits  Data Reviewed: Noted improvement in CBGs, stable hemoglobin, white count of 16.3  Disposition:  Status is: Inpatient Remains inpatient appropriate because:  -Improvement in leukocytosis -Evaluation of lower extremities/getting MRI/physical therapy evaluation    Anticipated discharge date: 3/7   Family Communication: Declined for me to call anyone DVT Prophylaxis:  apixaban (ELIQUIS) tablet 5 mg    Author: Annita Brod ,MD 05/11/2022 4:05 PM  To reach On-call, see care teams to locate the attending and reach out via [email or web address]. Between 7PM-7AM, please contact night-coverage If you still have difficulty reaching the attending provider, please page the [facility] (Director on Call) for [facility] on amion for assistance.

## 2022-05-11 NOTE — Assessment & Plan Note (Signed)
-   currently appears to be slightly on the dry side, hold home diuretics for tonight and restart when appears euvolemic, carefuly follow fluid status and Cr  

## 2022-05-11 NOTE — Hospital Course (Signed)
Patient is a 57 year old female with past medical history of morbid obesity, spinal stenosis uncontrolled diabetes mellitus type 2 with gastroparesis and stage IV chronic kidney disease, atrial fibrillation, hypertension and carcinoid tumor of the duodenum who presented to the emergency room on the evening of 3/4 with 1 week of abdominal pain, nausea and vomiting.  In the emergency room, patient found to have blood sugar of 540 and nonketotic hyperglycemia.  Patient started on IV fluids and insulin drip.  In addition, she reported to the admitting physician that she was having significant pain in her lower extremities which is now much worse even when she tried to pivot in and out of her wheelchair.  Patient was admitted to the hospitalist service.

## 2022-05-11 NOTE — Assessment & Plan Note (Signed)
Will replace and recheck

## 2022-05-11 NOTE — Inpatient Diabetes Management (Signed)
Inpatient Diabetes Program Recommendations  AACE/ADA: New Consensus Statement on Inpatient Glycemic Control (2015)  Target Ranges:  Prepandial:   less than 140 mg/dL      Peak postprandial:   less than 180 mg/dL (1-2 hours)      Critically ill patients:  140 - 180 mg/dL   Lab Results  Component Value Date   GLUCAP 205 (H) 05/11/2022   HGBA1C 10.1 (H) 03/21/2022    Review of Glycemic Control  Latest Reference Range & Units 05/11/22 04:59 05/11/22 06:03 05/11/22 07:04 05/11/22 08:10 05/11/22 10:23  Glucose-Capillary 70 - 99 mg/dL 133 (H) 152 (H) 159 (H) 159 (H) 205 (H)   Diabetes history: DM 2 Outpatient Diabetes medications:  Lantus 50 units q HS, Humalog 7-30 units tid with meals?? Current orders for Inpatient glycemic control:  IV insulin  Inpatient Diabetes Program Recommendations:    Spoke with patient regarding DM management at home. She states that she takes her insulin consistently but did not take it prior to coming to the hospital.  Patient states she is still in a lot of pain.  Discussed potential plan for transitioning off insulin drip today.     Consider transition to Semglee 30 units daily and Novolog moderate q 4 hours.  Will follow.   Thanks,  Adah Perl, RN, BC-ADM Inpatient Diabetes Coordinator Pager [phone]  (8a-5p)

## 2022-05-11 NOTE — Assessment & Plan Note (Signed)
White blood cell count of 15.4 on admission.  Has not really changed, but no source of infection noted.  More likely stress margination.  Procalcitonin only minimally elevated.  Favor rechecking labs in the morning.

## 2022-05-11 NOTE — H&P (Signed)
Deborah [name] [id number] DOB: 07-16-1965 DOA: 05/10/2022     PCP: Cena Benton, MD  [facility]  Outpatient Specialists:      Patient arrived to ER on 05/10/22 at 1900 Referred by Attending Elgie Congo, MD   Patient coming from:    home Lives alone,        Chief Complaint:   Chief Complaint  Patient presents with   Abdominal Pain    HPI: Deborah [name] is a 57 y.o. female with medical history significant of DM2, CKD IV, A.fib   Gastric ulcer, carcinoid tumor of the duodenum, systolic CHF EF [id number], HTN  Presented with  N/V Reports Nausea and voming  No CP no SOB decreased appetite for 2 wks  Hx of gastroparesis and DM2 Recent admit for the same in January 2024      Reports her sciatic pain is wore her right hand has been swollen but now better She is wheel chair bound Does not smoke or drink     Initial COVID TEST  NEGATIVE   Lab Results  Component Value Date   [place] 05/10/2022   [place] NEGATIVE 04/13/2021   [place] NEGATIVE 11/20/2020   [place] NEGATIVE 11/06/2020     Regarding pertinent Chronic problems:    Hyperlipidemia -  on statins Lipitor (atorvastatin)  Lipid Panel     Component Value Date/Time   CHOL 104 07/22/2019 1529   CHOL 106 12/13/2018 0000   TRIG 185 (H) 07/22/2019 1529   HDL 30 (L) 07/22/2019 1529   HDL 36 (L) 12/13/2018 0000   CHOLHDL 3.5 07/22/2019 1529   VLDL 37 07/22/2019 1529   [place] 37 07/22/2019 1529   [place] 38 12/13/2018 0000   LABVLDL 32 12/13/2018 0000     HTN on hydralazine, imdur, torprol   chronic CHF  systolic/ combined - last echo July 2023  EF 50 to 55%.  Demadex       DM 2 -  Lab Results  Component Value Date   HGBA1C 10.1 (H) 03/21/2022   on insulin,  sh has been taking 50 units at night of lantus   Morbid obesity-   BMI Readings from Last 1 Encounters:  05/10/22 54.88 kg/m       A. Fib -  - CHA2DS2 vas score   7      current  on  anticoagulation with  Eliquis,           -  Rate control:  Currently controlled with  Toprolol,       CKD stage IV- baseline Cr 3.5 Estimated Creatinine Clearance: 25.7 mL/min (A) (by C-G formula based on SCr of 3.67 mg/dL (H)).  Lab Results  Component Value Date   CREATININE 3.67 (H) 05/10/2022   CREATININE 3.02 (H) 03/22/2022   CREATININE 3.52 (H) 03/21/2022     Chronic anemia - baseline hg Hemoglobin & Hematocrit  Recent Labs    03/21/22 0312 05/10/22 2038 05/10/22 2042  HGB 7.4* 7.7* 9.2*    While in ER:   Noted to be hyperglycemic also reporting lower extremity pain extensive imaging showed no evidence of acute changes    Ordered   CTabd/pelvis -  nonacute  CTchest -  nonacute,    Following Medications were ordered in ER: Medications  insulin regular, human (MYXREDLIN) 100 units/ 100 mL infusion (has no administration in time range)  lactated ringers infusion (has no administration in time range)  dextrose 5 % in lactated ringers infusion (0  mLs Intravenous Hold 05/10/22 2338)  dextrose 50 % solution 0-50 mL (has no administration in time range)  droperidol (INAPSINE) 2.5 MG/ML injection 1.25 mg (has no administration in time range)  lactated ringers bolus 1,000 mL (0 mLs Intravenous Stopped 05/11/22 0023)  pantoprazole (PROTONIX) injection 40 mg (40 mg Intravenous Given 05/10/22 2053)  HYDROmorphone (DILAUDID) injection 1 mg (1 mg Intravenous Given 05/10/22 2049)  ondansetron (ZOFRAN) injection 4 mg (4 mg Intravenous Given 05/10/22 2052)  potassium chloride SA (KLOR-CON M) CR tablet 40 mEq (40 mEq Oral Given 05/10/22 2336)  magnesium sulfate IVPB 2 g 50 mL (0 g Intravenous Stopped 05/11/22 0024)  HYDROmorphone (DILAUDID) injection 0.5 mg (0.5 mg Intravenous Given 05/10/22 2337)       ED Triage Vitals  Enc Vitals Group     BP 05/10/22 1910 121/79     Pulse Rate 05/10/22 1910 (!) 111     Resp 05/10/22 1910 16     Temp 05/10/22 1910 (!) 97.3 F (36.3 C)     Temp Source  05/10/22 1910 Oral     SpO2 05/10/22 1910 100 %     Weight 05/10/22 1919 (!) 340 lb (154.2 kg)     Height 05/10/22 1919 '5\' 6"'$  (1.676 m)     Head Circumference --      Peak Flow --      Pain Score 05/10/22 1919 10     Pain Loc --      Pain Edu? --      Excl. in [place]? --   TMAX(24)@     _________________________________________ Significant initial  Findings: Abnormal Labs Reviewed  CBC WITH DIFFERENTIAL/PLATELET - Abnormal; Notable for the following components:      Result Value   WBC 15.4 (*)    RBC 2.71 (*)    Hemoglobin 7.7 (*)    HCT 25.2 (*)    Platelets 502 (*)    Neutro Abs 12.8 (*)    Abs Immature Granulocytes 0.08 (*)    All other components within normal limits  COMPREHENSIVE METABOLIC PANEL - Abnormal; Notable for the following components:   Sodium 127 (*)    Chloride 93 (*)    CO2 21 (*)    Glucose, Bld 540 (*)    BUN 82 (*)    Creatinine, Ser 3.67 (*)    Total Protein 8.3 (*)    Albumin 2.8 (*)    AST 14 (*)    GFR, Estimated 14 (*)    All other components within normal limits  MAGNESIUM - Abnormal; Notable for the following components:   Magnesium 1.6 (*)    All other components within normal limits  CBG MONITORING, ED - Abnormal; Notable for the following components:   Glucose-Capillary 521 (*)    All other components within normal limits  I-STAT VENOUS BLOOD GAS, ED - Abnormal; Notable for the following components:   pCO2, Ven 35.7 (*)    pO2, Ven 22 (*)    Acid-base deficit 3.0 (*)    Sodium 128 (*)    HCT 27.0 (*)    Hemoglobin 9.2 (*)    All other components within normal limits  CBG MONITORING, ED - Abnormal; Notable for the following components:   Glucose-Capillary 445 (*)    All other components within normal limits  CBG MONITORING, ED - Abnormal; Notable for the following components:   Glucose-Capillary 413 (*)    All other components within normal limits     _________________________ Troponin  12- 14  ECG: Ordered Personally reviewed and  interpreted by me showing: HR : 114 Rhythm: Sinus tachycardia Borderline left axis deviation Abnormal R-wave progression, late transition QTC 453     The recent clinical data is shown below. Vitals:   05/10/22 1919 05/10/22 1945 05/10/22 2326 05/10/22 2358  BP:    (!) 126/93  Pulse:    (!) 101  Resp:    16  Temp:   98 F (36.7 C)   TempSrc:      SpO2:  99%  95%  Weight: (!) 154.2 kg     Height: '5\' 6"'$  (1.676 m)       WBC     Component Value Date/Time   WBC 15.4 (H) 05/10/2022 2038   LYMPHSABS 1.6 05/10/2022 2038   LYMPHSABS 2.2 09/03/2019 1620   MONOABS 0.7 05/10/2022 2038   EOSABS 0.2 05/10/2022 2038   EOSABS 0.3 09/03/2019 1620   BASOSABS 0.1 05/10/2022 2038   BASOSABS 0.1 09/03/2019 1620     UA ordered     Results for orders placed or performed during the hospital encounter of 05/10/22  Resp panel by RT-PCR (RSV, Flu A&B, Covid) Anterior Nasal Swab     Status: None   Collection Time: 05/10/22  7:21 PM   Specimen: Anterior Nasal Swab  Result Value Ref Range Status   SARS Coronavirus 2 by RT PCR NEGATIVE NEGATIVE Final   Influenza A by PCR NEGATIVE NEGATIVE Final   Influenza B by PCR NEGATIVE NEGATIVE Final         Resp Syncytial Virus by PCR NEGATIVE NEGATIVE Final           _______________________________________________ Hospitalist was called for admission for   Abdominal pain, unspecified abdominal location    Hyperglycemia   Hypomagnesemia     The following Work up has been ordered so far:  Orders Placed This Encounter  Procedures   Resp panel by RT-PCR (RSV, Flu A&B, Covid) Anterior Nasal Swab   CT Head Wo Contrast   CT Cervical Spine Wo Contrast   CT CHEST ABDOMEN PELVIS WO CONTRAST   CT L-SPINE NO CHARGE   CT T-SPINE NO CHARGE   CBC with Differential   Comprehensive metabolic panel   Ethanol   Lipase, blood   Brain natriuretic peptide   Urinalysis, Routine w reflex microscopic -Urine, Clean Catch   Urine rapid drug screen (hosp  performed)   Magnesium   Phosphorus   Blood gas, venous   Diet NPO time specified   Cardiac monitoring   Notify physician (specify)   If present, discontinue Insulin Pump after IV Insulin is initiated.   Do NOT use lab glucose values in [employer].  If CBG meter reads "Critical High", enter 600.   Consult to hospitalist   Pulse oximetry, continuous   CBG monitoring, ED   I-Stat venous blood gas, ED   CBG monitoring, ED   CBG monitoring, ED   CBG monitoring, ED   EKG 12-Lead   Insert peripheral IV     OTHER Significant initial  Findings:  labs showing:  Recent Labs  Lab 05/10/22 2038 05/10/22 2042 05/11/22 0059  NA 127* 128* 127*  K 4.3 4.4 4.0  CO2 21*  --  21*  GLUCOSE 540*  --  422*  BUN 82*  --  76*  CREATININE 3.67*  --  3.29*  CALCIUM 9.5  --  9.2  MG 1.6*  --   --   PHOS 3.3  --   --  Cr    Up from baseline see below Lab Results  Component Value Date   CREATININE 3.29 (H) 05/11/2022   CREATININE 3.67 (H) 05/10/2022   CREATININE 3.02 (H) 03/22/2022    Recent Labs  Lab 05/10/22 2038  AST 14*  ALT 7  ALKPHOS 89  BILITOT 0.6  PROT 8.3*  ALBUMIN 2.8*   Lab Results  Component Value Date   CALCIUM 9.5 05/10/2022   PHOS 3.3 05/10/2022    Plt: Lab Results  Component Value Date   PLT 502 (H) 05/10/2022      COVID-19 Labs  No results for input(s): "DDIMER", "FERRITIN", "LDH", "CRP" in the last 72 hours.  Lab Results  Component Value Date   SARSCOV2NAA NEGATIVE 05/10/2022   SARSCOV2NAA NEGATIVE 04/13/2021   SARSCOV2NAA NEGATIVE 11/20/2020   SARSCOV2NAA NEGATIVE 11/06/2020    Venous  Blood Gas result:  pH, Ven 7.388 Sodium 128 Low  mmol/L  pCO2, Ven 35.7 Low  mmHg Potassium 4.4 mmol/L  pO2, Ven 22 Low Panic  mmHg     ABG    Component Value Date/Time   PHART 7.369 02/20/2019 1604   PCO2ART 35.1 02/20/2019 1604   PO2ART 59.0 (L) 02/20/2019 1604   HCO3 21.5 05/10/2022 2042   TCO2 23 05/10/2022 2042   ACIDBASEDEF 3.0 (H) 05/10/2022  2042   O2SAT 38 05/10/2022 2042         Recent Labs  Lab 05/10/22 2038 05/10/22 2042 05/11/22 0059  WBC 15.4*  --  14.2*  NEUTROABS 12.8*  --   --   HGB 7.7* 9.2* 7.5*  HCT 25.2* 27.0* 23.4*  MCV 93.0  --  89.7  PLT 502*  --  451*    HG/HCT  Down  from baseline see below    Component Value Date/Time   HGB 9.2 (L) 05/10/2022 2042   HGB 8.8 (L) 10/09/2019 0906   HGB 9.6 (L) 09/03/2019 1620   HCT 27.0 (L) 05/10/2022 2042   HCT 30.4 (L) 09/03/2019 1620   MCV 93.0 05/10/2022 2038   MCV 85 09/03/2019 1620      Recent Labs  Lab 05/10/22 2038  LIPASE 42   No results for input(s): "AMMONIA" in the last 168 hours.    Cardiac Panel (last 3 results) Recent Labs    05/11/22 0059  CKTOTAL 140    BNP (last 3 results) Recent Labs    08/15/21 2105 03/17/22 1654 05/10/22 2038  BNP 15.1 43.9 24.5      DM  labs:  HbA1C: Recent Labs    08/16/21 0009 03/21/22 0312  HGBA1C 7.5* 10.1*    CBG (last 3)  Recent Labs    05/10/22 1919 05/10/22 2328 05/11/22 0043  GLUCAP 521* 445* 413*    Cultures:    Component Value Date/Time   SDES BLOOD RIGHT ARM 08/16/2021 0556   SPECREQUEST  08/16/2021 0556    BOTTLES DRAWN AEROBIC AND ANAEROBIC Blood Culture adequate volume   CULT  08/16/2021 0556    NO GROWTH 5 DAYS Performed at [facility], [place] [address] [place],  [address]    REPTSTATUS 08/21/2021 FINAL 08/16/2021 0556     Radiological Exams on Admission: CT CHEST ABDOMEN PELVIS WO CONTRAST  Result Date: 05/10/2022 CLINICAL DATA:  Vomiting, weakness, and abdominal pain. EXAM: CT CHEST, ABDOMEN AND PELVIS WITHOUT CONTRAST TECHNIQUE: Multidetector CT imaging of the chest, abdomen and pelvis was performed following the standard protocol without IV contrast. RADIATION DOSE REDUCTION: This exam was performed according to  the departmental dose-optimization program which includes automated exposure control, adjustment of the mA and/or kV according to patient  size and/or use of iterative reconstruction technique. COMPARISON:  CT abdomen pelvis dated 03/17/2022. FINDINGS: Evaluation of this exam is limited in the absence of intravenous contrast as well as due to streak artifact caused by patient's arms. CT CHEST FINDINGS Cardiovascular: There is no cardiomegaly or pericardial effusion. Coronary vascular calcification of the LAD and RCA. Mild atherosclerotic calcification of the thoracic aorta. No aneurysmal dilatation. The central pulmonary arteries are grossly unremarkable. Mediastinum/Nodes: No hilar or mediastinal adenopathy. The esophagus is grossly unremarkable. No mediastinal fluid collection. Lungs/Pleura: The lungs are clear. There is no pleural effusion or pneumothorax. The central airways are patent. Musculoskeletal: Degenerative changes of the spine. No acute osseous pathology. CT ABDOMEN PELVIS FINDINGS No intra-abdominal free air or free fluid. Hepatobiliary: The liver is grossly unremarkable but suboptimally visualized due to streak artifact caused by patient's arms. Cholecystectomy. Pancreas: Unremarkable. No pancreatic ductal dilatation or surrounding inflammatory changes. Spleen: Normal in size without focal abnormality. Adrenals/Urinary Tract: The adrenal glands are unremarkable. There is no hydronephrosis or nephrolithiasis on either side. The visualized ureters and urinary bladder appear unremarkable. Stomach/Bowel: There is no bowel obstruction or active inflammation. The appendix is normal. Vascular/Lymphatic: Mild atherosclerotic calcification of the abdominal aorta. The IVC is unremarkable. No portal venous gas. There is no adenopathy. Reproductive: The uterus is anteverted. Similar appearance of a 4 cm left uterine fundus exophytic fibroid. Other: None Musculoskeletal: Degenerative changes of the spine. No acute osseous pathology. IMPRESSION: 1. No acute intrathoracic, abdominal, or pelvic pathology. 2. No bowel obstruction. Normal appendix. 3.   Aortic Atherosclerosis (ICD10-I70.0). Electronically Signed   By: Anner Crete M.D.   On: 05/10/2022 23:32   CT L-SPINE NO CHARGE  Result Date: 05/10/2022 CLINICAL DATA:  Right-sided pain and generalized weakness. EXAM: CT THORACIC AND LUMBAR SPINE WITHOUT CONTRAST TECHNIQUE: Multidetector CT imaging of the thoracic and lumbar spine was performed without contrast. Multiplanar CT image reconstructions were also generated. RADIATION DOSE REDUCTION: This exam was performed according to the departmental dose-optimization program which includes automated exposure control, adjustment of the mA and/or kV according to patient size and/or use of iterative reconstruction technique. COMPARISON:  CTA chest abdomen pelvis dated 05/10/2022 and CT abdomen pelvis dated 03/17/2022. FINDINGS: CT THORACIC SPINE FINDINGS Alignment: No acute subluxation. Vertebrae: No acute fracture. Paraspinal and other soft tissues: Negative. Disc levels: No acute findings. Multilevel degenerative changes most prominent at T7-T8 with disc space narrowing, endplate irregularity and spurring/osteophyte. CT LUMBAR SPINE FINDINGS Segmentation: 5 lumbar type vertebrae. Alignment: No acute subluxation.  Grade 1 L4-L5 anterolisthesis. Vertebrae: No acute fracture. Paraspinal and other soft tissues: Negative. Disc levels: No acute findings. Degenerative changes primarily at L5-S1 with anterior osteophyte. IMPRESSION: 1. No acute/traumatic thoracic or lumbar spine pathology. 2. Degenerative changes. Electronically Signed   By: Anner Crete M.D.   On: 05/10/2022 23:25   CT T-SPINE NO CHARGE  Result Date: 05/10/2022 CLINICAL DATA:  Right-sided pain and generalized weakness. EXAM: CT THORACIC AND LUMBAR SPINE WITHOUT CONTRAST TECHNIQUE: Multidetector CT imaging of the thoracic and lumbar spine was performed without contrast. Multiplanar CT image reconstructions were also generated. RADIATION DOSE REDUCTION: This exam was performed according to the  departmental dose-optimization program which includes automated exposure control, adjustment of the mA and/or kV according to patient size and/or use of iterative reconstruction technique. COMPARISON:  CTA chest abdomen pelvis dated 05/10/2022 and CT abdomen pelvis dated 03/17/2022. FINDINGS: CT  THORACIC SPINE FINDINGS Alignment: No acute subluxation. Vertebrae: No acute fracture. Paraspinal and other soft tissues: Negative. Disc levels: No acute findings. Multilevel degenerative changes most prominent at T7-T8 with disc space narrowing, endplate irregularity and spurring/osteophyte. CT LUMBAR SPINE FINDINGS Segmentation: 5 lumbar type vertebrae. Alignment: No acute subluxation.  Grade 1 L4-L5 anterolisthesis. Vertebrae: No acute fracture. Paraspinal and other soft tissues: Negative. Disc levels: No acute findings. Degenerative changes primarily at L5-S1 with anterior osteophyte. IMPRESSION: 1. No acute/traumatic thoracic or lumbar spine pathology. 2. Degenerative changes. Electronically Signed   By: Anner Crete M.D.   On: 05/10/2022 23:25   CT Head Wo Contrast  Result Date: 05/10/2022 CLINICAL DATA:  Weakness and pain, initial encounter EXAM: CT HEAD WITHOUT CONTRAST CT CERVICAL SPINE WITHOUT CONTRAST TECHNIQUE: Multidetector CT imaging of the head and cervical spine was performed following the standard protocol without intravenous contrast. Multiplanar CT image reconstructions of the cervical spine were also generated. RADIATION DOSE REDUCTION: This exam was performed according to the departmental dose-optimization program which includes automated exposure control, adjustment of the mA and/or kV according to patient size and/or use of iterative reconstruction technique. COMPARISON:  None Available. FINDINGS: CT HEAD FINDINGS Brain: No evidence of acute infarction, hemorrhage, hydrocephalus, extra-axial collection or mass lesion/mass effect. Mild atrophic changes are noted. Vascular: No hyperdense vessel or  unexpected calcification. Skull: Normal. Negative for fracture or focal lesion. Sinuses/Orbits: No acute finding. Other: None. CT CERVICAL SPINE FINDINGS Alignment: Mild straightening of the normal cervical lordosis is noted. Skull base and vertebrae: In cervical segments are well visualized. Vertebral body height is well maintained. No acute fracture or acute facet abnormality is noted. Mild osteophytic changes are noted worst at C5-6. Posterior fusion defect is noted at C1 posteriorly felt to be congenital in nature. The odontoid is within normal limits. Soft tissues and spinal canal: Surrounding soft tissue structures are within normal limits. Upper chest: Visualized lung apices are within normal limits. Other: None IMPRESSION: CT of the head: No acute intracranial abnormality noted. Mild atrophic changes are noted. CT of the cervical spine: Mild degenerative change without acute abnormality. Electronically Signed   By: Inez Catalina M.D.   On: 05/10/2022 23:21   CT Cervical Spine Wo Contrast  Result Date: 05/10/2022 CLINICAL DATA:  Weakness and pain, initial encounter EXAM: CT HEAD WITHOUT CONTRAST CT CERVICAL SPINE WITHOUT CONTRAST TECHNIQUE: Multidetector CT imaging of the head and cervical spine was performed following the standard protocol without intravenous contrast. Multiplanar CT image reconstructions of the cervical spine were also generated. RADIATION DOSE REDUCTION: This exam was performed according to the departmental dose-optimization program which includes automated exposure control, adjustment of the mA and/or kV according to patient size and/or use of iterative reconstruction technique. COMPARISON:  None Available. FINDINGS: CT HEAD FINDINGS Brain: No evidence of acute infarction, hemorrhage, hydrocephalus, extra-axial collection or mass lesion/mass effect. Mild atrophic changes are noted. Vascular: No hyperdense vessel or unexpected calcification. Skull: Normal. Negative for fracture or focal  lesion. Sinuses/Orbits: No acute finding. Other: None. CT CERVICAL SPINE FINDINGS Alignment: Mild straightening of the normal cervical lordosis is noted. Skull base and vertebrae: In cervical segments are well visualized. Vertebral body height is well maintained. No acute fracture or acute facet abnormality is noted. Mild osteophytic changes are noted worst at C5-6. Posterior fusion defect is noted at C1 posteriorly felt to be congenital in nature. The odontoid is within normal limits. Soft tissues and spinal canal: Surrounding soft tissue structures are within normal limits. Upper chest:  Visualized lung apices are within normal limits. Other: None IMPRESSION: CT of the head: No acute intracranial abnormality noted. Mild atrophic changes are noted. CT of the cervical spine: Mild degenerative change without acute abnormality. Electronically Signed   By: Inez Catalina M.D.   On: 05/10/2022 23:21   _______________________________________________________________________________________________________ Latest  Blood pressure (!) 126/93, pulse (!) 101, temperature 98 F (36.7 C), resp. rate 16, height '5\' 6"'$  (1.676 m), weight (!) 154.2 kg, last menstrual period 10/10/2012, SpO2 95 %.   Vitals  labs and radiology finding personally reviewed  Review of Systems:    Pertinent positives include:  fatigue Bilateral lower extremity pain Constitutional:  No weight loss, night sweats, Fevers, chills, , weight loss  HEENT:  No headaches, Difficulty swallowing,Tooth/dental problems,Sore throat,  No sneezing, itching, ear ache, nasal congestion, post nasal drip,  Cardio-vascular:  No chest pain, Orthopnea, PND, anasarca, dizziness, palpitations.no swelling  GI:  No heartburn, indigestion, abdominal pain, nausea, vomiting, diarrhea, change in bowel habits, loss of appetite, melena, blood in stool, hematemesis Resp:  no shortness of breath at rest. No dyspnea on exertion, No excess mucus, no productive cough, No  non-productive cough, No coughing up of blood.No change in color of mucus.No wheezing. Skin:  no rash or lesions. No jaundice GU:  no dysuria, change in color of urine, no urgency or frequency. No straining to urinate.  No flank pain.  Musculoskeletal:  No joint pain or no joint swelling. No decreased range of motion. No back pain.  Psych:  No change in mood or affect. No depression or anxiety. No memory loss.  Neuro: no localizing neurological complaints, no tingling, no weakness, no double vision, no gait abnormality, no slurred speech, no confusion  All systems reviewed and apart from [place] all are negative _______________________________________________________________________________________________ Past Medical History:   Past Medical History:  Diagnosis Date   Acute back pain with sciatica, left    Acute back pain with sciatica, right    AKI (acute kidney injury) ([place])    Anemia, unspecified    Cancer (HCC)    Carcinoid tumor of duodenum    Chest pain with normal coronary angiography 2019   Chronic kidney disease, stage 3b (HCC)    Chronic pain    Chronic systolic CHF (congestive heart failure) (HCC)    Diabetes mellitus    DKA (diabetic ketoacidosis) ([place])    Drug-seeking behavior    21 hospitalizations and 14 CT a/p in 2 years for N/V and abdominal pain, demanding only IV dilaudid   Elevated troponin    chronic   Esophageal reflux    Fibromyalgia    Gastric ulcer    Gastroparesis    Gout    Hyperlipidemia    Hypertension    Hypokalemia    Hypomagnesemia    Lumbosacral stenosis    LVH (left ventricular hypertrophy)    Morbid obesity (HCC)    NICM (nonischemic cardiomyopathy) (HCC)    PAF (paroxysmal atrial fibrillation) ([place])    Stroke ([place]) 02/2011   Thrombocytosis    Vitamin B12 deficiency anemia     Past Surgical History:  Procedure Laterality Date   BIOPSY  07/27/2019   Procedure: BIOPSY;  Surgeon: Clarene Essex, MD;  Location: [facility];  Service:  Endoscopy;;   BIOPSY  07/30/2019   Procedure: BIOPSY;  Surgeon: Otis Brace, MD;  Location: [facility];  Service: Gastroenterology;;   CATARACT EXTRACTION  01/2014   CHOLECYSTECTOMY     COLONOSCOPY WITH PROPOFOL N/A 07/30/2019   Procedure:  COLONOSCOPY WITH PROPOFOL;  Surgeon: Otis Brace, MD;  Location: [facility];  Service: Gastroenterology;  Laterality: N/A;   ESOPHAGOGASTRODUODENOSCOPY N/A 07/27/2019   Procedure: ESOPHAGOGASTRODUODENOSCOPY (EGD);  Surgeon: Clarene Essex, MD;  Location: [facility];  Service: Endoscopy;  Laterality: N/A;   ESOPHAGOGASTRODUODENOSCOPY N/A 07/26/2020   Procedure: ESOPHAGOGASTRODUODENOSCOPY (EGD);  Surgeon: Arta Silence, MD;  Location: [facility];  Service: Endoscopy;  Laterality: N/A;   ESOPHAGOGASTRODUODENOSCOPY (EGD) WITH PROPOFOL N/A 08/02/2019   Procedure: ESOPHAGOGASTRODUODENOSCOPY (EGD) WITH PROPOFOL;  Surgeon: Otis Brace, MD;  Location: [facility];  Service: Gastroenterology;  Laterality: N/A;   HEMOSTASIS CLIP PLACEMENT  08/02/2019   Procedure: HEMOSTASIS CLIP PLACEMENT;  Surgeon: Otis Brace, MD;  Location: [facility];  Service: Gastroenterology;;   POLYPECTOMY  07/30/2019   Procedure: POLYPECTOMY;  Surgeon: Otis Brace, MD;  Location: [facility];  Service: Gastroenterology;;   POLYPECTOMY  08/02/2019   Procedure: POLYPECTOMY;  Surgeon: Otis Brace, MD;  Location: [facility];  Service: Gastroenterology;;    Social History:  Ambulatory  wheelchair bound,       reports that she has never smoked. She has never used smokeless tobacco. She reports that she does not drink alcohol and does not use drugs.     Family History:   Family History  Problem Relation Age of Onset   Diabetes Mother    Diabetes Father    Heart disease Father    Diabetes Sister    Congestive Heart Failure Sister 37   Diabetes Brother     ______________________________________________________________________________________________ Allergies: Allergies  Allergen Reactions   Diazepam [name]   Gabapentin [name] and Swelling    Other reaction(s): Unknown   Iodinated Contrast Media Anaphylaxis    11/29/17 Cardiac arrest 1 min after IV contrast, possible allergy vs vasovagal episode Iopamidol  Anaphylaxis  High 11/28/2017  Patient had seizure like activity and then code post 100 cc of isovue 300     Isovue [Iopamidol] Anaphylaxis    11/28/17 Patient had seizure like activity and then 1 min code after 100 cc of isovue 300. Possible contrast allergy vs vasovagal episode   Lisinopril Anaphylaxis    Tongue and mouth swelling   Metoclopramide Other (See Comments)    Tardive dyskinesia   Nsaids Anaphylaxis and Other (See Comments)    ULCER   Penicillins Palpitations    Has patient had a PCN reaction causing immediate rash, facial/tongue/throat swelling, SOB or lightheadedness with hypotension: Yes, heart races Has patient had a PCN reaction causing severe rash involving mucus membranes or skin necrosis: No Has patient had a PCN reaction that required hospitalization: Yes  Has patient had a PCN reaction occurring within the last 10 years: No    Dicyclomine Hcl Other (See Comments)    Reports chest pain   Tolmetin Nausea Only and Other (See Comments)    ULCER   Rifamycins Other (See Comments)    unknown   Acetaminophen Nausea Only and Other (See Comments)    Irritates stomach ulcer; Abdominal pain   Cyclobenzaprine Palpitations    Unknown   Oxycodone Palpitations   Tramadol Nausea And Vomiting     Prior to Admission medications   Medication Sig Start Date End Date Taking? Authorizing Provider  albuterol (PROVENTIL) (2.5 MG/3ML) 0.083% nebulizer solution Take 3 mLs (2.5 mg total) by nebulization every 6 (six) hours as needed for wheezing or shortness of breath. 04/06/19   Scot Jun, NP   apixaban (ELIQUIS) 5 MG TABS tablet Take 5 mg by mouth 2 (  two) times daily. 12/03/21   [provider]  atorvastatin (LIPITOR) 10 MG tablet Take 10 mg by mouth daily. 12/03/21   [provider]  cetirizine (ZYRTEC) 10 MG tablet TAKE 1 TABLET (10 MG TOTAL) BY MOUTH DAILY (AM) Patient taking differently: Take 10 mg by mouth daily. 06/30/21   Raulkar, Clide Deutscher, MD  DULoxetine HCl 40 MG CPEP Take 40 mg by mouth in the morning and at bedtime. 03/22/22   Shary Key, DO  EASY COMFORT PEN NEEDLES 31G X 5 MM MISC USE 3 TIMES A DAY FOR INSULIN ADMINISTRATION 11/14/19   Meccariello, Bernita Raisin, MD  famotidine (PEPCID) 20 MG tablet Take 1 tablet (20 mg total) by mouth daily. 03/22/22 07/20/22  Shary Key, DO  fluticasone (FLONASE) 50 MCG/ACT nasal spray Place 2 sprays into both nostrils daily as needed for allergies or rhinitis. Patient taking differently: Place 1 spray into both nostrils daily. 12/19/18   Rai, Vernelle Emerald, MD  folic acid (FOLVITE) 1 MG tablet Take 1 mg by mouth daily. 04/07/21   [provider]  hydrALAZINE (APRESOLINE) 25 MG tablet Take 1 tablet (25 mg total) by mouth 3 (three) times daily. 03/22/22   Shary Key, DO  HYDROmorphone (DILAUDID) 4 MG tablet TAKE 1 TABLET (4 MG TOTAL) BY MOUTH 2 (TWO) TIMES DAILY. MAY TAKE AN EXTRA TABLET WHEN PAIN IS SEVERE 20 DAYS OUT OF THE MONTH  FILL ON 04/02/2022   04/29/22   Raulkar, Clide Deutscher, MD  insulin lispro (HUMALOG) 100 UNIT/ML KwikPen Inject 7 Units into the skin 3 (three) times daily with meals. Patient taking differently: Inject 30 Units into the skin in the morning, at noon, in the evening, and at bedtime. 08/28/21   Gaylan Gerold, DO  isosorbide mononitrate (IMDUR) 30 MG 24 hr tablet Take 0.5 tablets (15 mg total) by mouth daily. 03/22/22   Shary Key, DO  LANTUS SOLOSTAR 100 UNIT/ML Solostar Pen Inject 35 Units into the skin at bedtime. Patient taking differently: Inject 50 Units into the skin at bedtime.  08/28/21   Gaylan Gerold, DO  linaclotide [facility]) 145 MCG CAPS capsule Take 1 capsule (145 mcg total) by mouth daily before breakfast. 03/22/22 04/21/22  Shary Key, DO  magnesium chloride (SLOW-MAG) 64 MG TBEC SR tablet Take 1 tablet by mouth daily. 12/07/21   [provider]  meclizine (ANTIVERT) 25 MG tablet Take 1 tablet (25 mg total) by mouth 3 (three) times daily as needed for dizziness. 02/20/21   Raulkar, Clide Deutscher, MD  metoprolol succinate (TOPROL-XL) 50 MG 24 hr tablet Take 1 tablet (50 mg total) by mouth daily. Take with or immediately following a meal. 11/24/20 03/18/23  Enzo Bi, MD  ondansetron (ZOFRAN ODT) 4 MG disintegrating tablet Take 1 tablet (4 mg total) by mouth every 8 (eight) hours as needed for nausea or vomiting. 05/30/21   Sherwood Gambler, MD  pantoprazole (PROTONIX) 40 MG tablet TAKE 1 TABLET BY MOUTH 2 (TWO) TIMES DAILY. (AM+BEDTIME) Patient taking differently: Take 40 mg by mouth 2 (two) times daily. 03/11/22   Raulkar, Clide Deutscher, MD  SENNA PLUS 8.6-50 MG tablet Take 1 tablet by mouth daily. 05/19/21   [provider]  sodium bicarbonate 650 MG tablet Take 1 tablet (650 mg total) by mouth 2 (two) times daily. 04/20/21   Jennye Boroughs, MD  torsemide (DEMADEX) 20 MG tablet Take 2 tablets by mouth daily. 12/03/21   [provider]  Vitamin D, Ergocalciferol, (DRISDOL) 1.25 MG (  50000 UNIT) CAPS capsule Take 50,000 Units by mouth every Monday. 04/07/21   [provider]  NOVOLOG FLEXPEN 100 UNIT/ML FlexPen Inject 7 Units into the skin 3 (three) times daily with meals. 08/28/21 08/28/21  Gaylan Gerold, DO    ___________________________________________________________________________________________________ Physical Exam:    05/10/2022   11:58 PM 05/10/2022    7:19 PM 05/10/2022    7:10 PM  Vitals with BMI  Height  '5\' 6"'$    Weight  340 lbs   BMI  123456   Systolic 123XX123  123XX123  Diastolic 93  79  Pulse 99991111  111     1. General:  in No  Acute  distress   Chronically ill   -appearing 2. Psychological: Alert and   Oriented 3. Head/ENT:    Dry Mucous Membranes                          Head Non traumatic, neck supple                         Poor Dentition 4. SKIN: normal  decreased Skin turgor,  Skin clean Dry and intact no rash 5. Heart: Regular rate and rhythm no  Murmur, no Rub or gallop 6. Lungs:  , no wheezes or crackles   7. Abdomen: Soft,  non-tender, Non distended   obese  bowel sounds present 8. Lower extremities: no clubbing, cyanosis, no  edema 9. Neurologically states bilateral legs hurt to rise patient resistant to me examining him states it hurts too much for me to touch her legs in any way 10. MSK: Decreased range of motion in the lower extremities    Chart has been reviewed  ______________________________________________________________________________________________  Assessment/Plan  57 y.o. female with medical history significant of DM2, CKD IV, A.fib   Gastric ulcer, carcinoid tumor of the duodenum, systolic CHF EF [id number], HTN   Admitted for   HHS, leg pain possible sciatica    Hypomagnesemia     Present on Admission:  Hyperosmolar hyperglycemic state (HHS) ([place])  Diabetic gastroparesis ([place])  Chronic kidney disease, stage 3b ([place])  Essential hypertension, benign  Normocytic anemia  GERD (gastroesophageal reflux disease)  Morbid obesity ([place])  Malignant carcinoid tumor of duodenum (HCC)  PAF (paroxysmal atrial fibrillation) ([place])  Spinal stenosis, lumbar region with neurogenic claudication  Chronic systolic CHF (congestive heart failure) ([place])  Hypomagnesemia     Diabetic gastroparesis ([place]) Patient states that that has not been bothering her as much fluid recently she just had decreased appetite and no BMs. Will evaluate for presence of constipation Noted possible tardive dyskinesia we will hold off on Reglan if able  Chronic kidney disease, stage 3b (HCC)  -chronic avoid nephrotoxic  medications such as NSAIDs, Vanco Zosyn combo,  avoid hypotension, continue to follow renal function   Hyperosmolar hyperglycemic state (HHS) ([place]) will admit per D HHS protocol, obtain serial BMET, start on glucosestabalizer,  iVF.      Monitor in [place]. Replace potassium as needed.     Consult diabetes coordinator    Essential hypertension, benign Restart hydralazine 25 mg 3 times daily if blood pressure allows  Normocytic anemia Hemoglobin down to 7.5 patient is amendable to blood transfusion if necessary we will continue to monitor CBC denies any chest pain or shortness of breath Denies any bleeding obtain anemia panel Hemoccult stool transfuse if hemoglobin goes below 7 obtain type and screen  GERD (gastroesophageal reflux disease) Continue  Protonix 40 mg a day  Morbid obesity ([place]) Will need to have follow-up as an outpatient  Malignant carcinoid tumor of duodenum ([place]) Will need to continue to follow-up as an outpatient  PAF (paroxysmal atrial fibrillation) ([place]) Continue for now Eliquis Monitor for any sign of bleeding If blood pressure allows continue toprolol 50 mg a day  Spinal stenosis, lumbar region with neurogenic claudication Patient now reports significant Pain in her lower extremities bilateral hips pain with attempt to pivot when she is standing up from wheelchair which is preventing her from ambulating She does not tolerate MRIs well but would be willing if her head does not have to go inside Patient has undergone CTA of thoracic lumbar and cervical spine in the emergency department which shows degenerative disease but no acute findings  Will attempt to order MRI of the lumbar spine  Recommend PT OT although patient seemed reluctant.  Recommended patient may need to get a to rehab facility because she lives at home and unable to ambulate patient was reluctant regarding that as well   Chronic systolic CHF (congestive heart failure) ([place]) - currently  appears to be slightly on the dry side, hold home diuretics for tonight and restart when appears euvolemic, carefuly follow fluid status and Cr   Hypomagnesemia Will replace and recheck   Other plan as per orders.  DVT prophylaxis:  on eliquis       Code Status:    Code Status: Prior FULL CODE  as per patient    I had personally discussed CODE STATUS with patient     Family Communication:   Family not at  Bedside    Disposition Plan:     likely will need placement for rehabilitation                            Following barriers for discharge:                            Electrolytes corrected                               Anemia corrected                             Pain controlled with PO medications                                                     Would benefit from PT/OT eval prior to DC  Ordered                                     Diabetes care coordinator                    Consults called: none   Admission status:  ED Disposition     ED Disposition  [place]: [facility] [[id number]]  Level of Care: Progressive [102]  Admit to Progressive based on following criteria: CARDIOVASCULAR & THORACIC of moderate stability with acute coronary syndrome  symptoms/low risk myocardial infarction/hypertensive urgency/arrhythmias/heart failure potentially compromising stability and stable post cardiovascular intervention patients.  May place patient in observation at [facility] or [facility] if equivalent level of care is available:: No  Covid Evaluation: Confirmed COVID Negative  Diagnosis: Hyperosmolar hyperglycemic state (HHS) [id number]  Admitting Physician: Toy Baker [[id number]]  Attending Physician: Toy Baker [[id number]]          Obs         Level of care      progressive tele indefinitely please discontinue once patient no longer qualifies COVID-19 Labs     Lab Results  Component Value Date    [place] 05/10/2022     Precautions: admitted as   Covid Negative     Reyaan Thoma 05/11/2022, 2:32 AM    [facility]     after 2 AM please page floor coverage PA If 7AM-7PM, please contact the day team taking care of the patient using [email or web address]   Patient was evaluated in the context of the global COVID-19 pandemic, which necessitated consideration that the patient might be at risk for infection with the SARS-CoV-2 virus that causes COVID-19. Institutional protocols and algorithms that pertain to the evaluation of patients at risk for COVID-19 are in a state of rapid change based on information released by regulatory bodies including the CDC and federal and state organizations. These policies and algorithms were followed during the patient's care.

## 2022-05-11 NOTE — Progress Notes (Signed)
Pt educated that urine sample is still needed.

## 2022-05-11 NOTE — Progress Notes (Signed)
61 Pt came to MRI and refused PO medication, nurse and doc to discuss with pt

## 2022-05-11 NOTE — Evaluation (Signed)
Physical Therapy Evaluation Patient Details Name: Deborah [name] MRN: [id number] DOB: 09-25-1965 Today's Date: 05/11/2022  History of Present Illness  57 y.o. female with medical history significant of DM2, CKD IV, A.fib    Gastric ulcer, carcinoid tumor of the duodenum, systolic CHF EF A999333, HTN    Admitted for   HHS, leg pain possible sciatica  Clinical Impression  Pt presents with admitting diagnosis above. Pt today had poor tolerance to treatment. Pt was able to sit EOB with increased time however declined to stand and was very limited by back and BLE pain. Pt reports that back pain started a week ago after a fall in her home requiring a call to EMS to assist. Pt is scheduled for an MRI today. Pt is WC level at baseline and able to perform her own stand step transfers independently. Anticipate that once pt pain is under control then pt would able to progress to transfers again. Given pt current state recommend SNF however if pt progresses then could go home with HHPT. PT will follow.       Recommendations for follow up therapy are one component of a multi-disciplinary discharge planning process, led by the attending physician.  Recommendations may be updated based on patient status, additional functional criteria and insurance authorization.  Follow Up Recommendations Skilled nursing-short term rehab (<3 hours/day) (However could progress to HHPT if pt progresses) Can patient physically be transported by private vehicle: No    Assistance Recommended at Discharge Frequent or constant Supervision/Assistance  Patient can return home with the following  A lot of help with walking and/or transfers;A lot of help with bathing/dressing/bathroom;Assistance with cooking/housework;Assist for transportation;Help with stairs or ramp for entrance    Equipment Recommendations Other (comment) (Per accepting facility)  Recommendations for Other Services       Functional Status Assessment Patient has  had a recent decline in their functional status and demonstrates the ability to make significant improvements in function in a reasonable and predictable amount of time.     Precautions / Restrictions Precautions Precautions: Fall Restrictions Weight Bearing Restrictions: No      Mobility  Bed Mobility Overal bed mobility: Needs Assistance Bed Mobility: Rolling, Supine to Sit, Sit to Supine Rolling: Min guard   Supine to sit: Supervision (Increased time) Sit to supine: Min assist (For RLE management)   General bed mobility comments: Increased time. Reported dizziness getting back in bed however VSS.    Transfers                   General transfer comment: Pt declined due to pain    Ambulation/Gait                  Stairs            Wheelchair Mobility    Modified Rankin (Stroke Patients Only)       Balance Overall balance assessment: Mild deficits observed, not formally tested                                           Pertinent Vitals/Pain Pain Assessment Pain Assessment: 0-10 Pain Score: 10-Worst pain ever Pain Location: "From hips on down to feet" Pain Descriptors / Indicators: Constant, Shooting Pain Intervention(s): Premedicated before session, Monitored during session, Limited activity within patient's tolerance    Home Living Family/patient expects to be discharged to:: Private residence  Living Arrangements: Alone Available Help at Discharge: Personal care attendant;Available PRN/intermittently (Personal care attendant everyday for 2.5 hours) Type of Home: Apartment Home Access: Stairs to enter Entrance Stairs-Rails: None Entrance Stairs-Number of Steps: 2 (Door threshold and 1 step up. Pt states shes able to navigate steps with WC)   Home Layout: One level Home Equipment: BSC/3in1;Shower seat;Wheelchair - manual Additional Comments: Pt would like a bariatric BSC ordered. States that her current one doesnt  work.    Prior Function Prior Level of Function : Needs assist;History of Falls (last six months)       Physical Assist : Mobility (physical);ADLs (physical)     Mobility Comments: Pt reports using WC at all times however is able to transfer on her own OOB and to toilet without AD. ADLs Comments: Pt reports that she is mostly independent but her aide helps out with dressing and cleaning as needed.     Hand Dominance   Dominant Hand: Right    Extremity/Trunk Assessment   Upper Extremity Assessment Upper Extremity Assessment: Overall WFL for tasks assessed    Lower Extremity Assessment Lower Extremity Assessment: Generalized weakness       Communication   Communication: No difficulties  Cognition Arousal/Alertness: Awake/alert Behavior During Therapy: WFL for tasks assessed/performed Overall Cognitive Status: Within Functional Limits for tasks assessed                                          General Comments General comments (skin integrity, edema, etc.): VSS on RA. Pt very limited by back pain.    Exercises     Assessment/Plan    PT Assessment Patient needs continued PT services  PT Problem List Decreased strength;Decreased activity tolerance;Decreased mobility;Decreased coordination;Pain       PT Treatment Interventions Functional mobility training;Therapeutic activities;Therapeutic exercise;Neuromuscular re-education;Patient/family education;Wheelchair mobility training    PT Goals (Current goals can be found in the Care Plan section)  Acute Rehab PT Goals Patient Stated Goal: To get rid of this pain PT Goal Formulation: With patient Time For Goal Achievement: 05/25/22 Potential to Achieve Goals: Fair    Frequency Min 3X/week     Co-evaluation               AM-PAC PT "6 Clicks" Mobility  Outcome Measure Help needed turning from your back to your side while in a flat bed without using bedrails?: A Little Help needed moving  from lying on your back to sitting on the side of a flat bed without using bedrails?: A Lot Help needed moving to and from a bed to a chair (including a wheelchair)?: Total Help needed standing up from a chair using your arms (e.g., wheelchair or bedside chair)?: Total Help needed to walk in hospital room?: Total Help needed climbing 3-5 steps with a railing? : Total 6 Click Score: 9    End of Session   Activity Tolerance: Patient limited by pain Patient left: in bed;with call bell/phone within reach Nurse Communication: Mobility status PT Visit Diagnosis: Muscle weakness (generalized) (M62.81);Pain Pain - Right/Left:  (B) Pain - part of body: Leg (BLE and back)    Time: [id number] PT Time Calculation (min) (ACUTE ONLY): 50 min   Charges:   PT Evaluation $PT Eval Moderate Complexity: 1 Mod PT Treatments $Therapeutic Activity: 38-52 mins        Shelby Mattocks, PT, DPT Acute Rehab Services [id number]  Viann Shove 05/11/2022, 2:10 PM

## 2022-05-11 NOTE — Assessment & Plan Note (Signed)
Unclear etiology of worsening pain.  Patient already in wheelchair, but she states pain now so severe she can barely even moved to pivot in wheelchair.  Unable to get lumbar MRI today, due to miscommunication and concerns for anxiety.  Had extensive discussion with patient and she is quite amenable to IV Valium tomorrow and MRI.

## 2022-05-11 NOTE — Assessment & Plan Note (Signed)
Hemoglobin down to 7.5 patient is amendable to blood transfusion if necessary we will continue to monitor CBC denies any chest pain or shortness of breath Denies any bleeding obtain anemia panel Hemoccult stool transfuse if hemoglobin goes below 7 obtain type and screen

## 2022-05-11 NOTE — Progress Notes (Signed)
OT Cancellation Note  Patient Details Name: Deborah [name] MRN: [id number] DOB: 25-Oct-1965   Cancelled Treatment:    Reason Eval/Treat Not Completed: Patient at procedure or test/ unavailable.  At MRI, will continue efforts.    Ethyl Vila D Xaniyah Buchholz 05/11/2022, 12:02 PM 05/11/2022  RP, OTR/L  Acute Rehabilitation Services  Office:  [phone]

## 2022-05-11 NOTE — Assessment & Plan Note (Signed)
Metoprolol held on admission due to hypotension from hyperglycemia.  Now that she is more fluid replete, heart rate starting to trend upward.  Already on Eliquis.  Restarted metoprolol.

## 2022-05-11 NOTE — Assessment & Plan Note (Signed)
Outpatient with oncology

## 2022-05-11 NOTE — Assessment & Plan Note (Signed)
.    Meets criteria BMI greater than 40 

## 2022-05-11 NOTE — Assessment & Plan Note (Signed)
Secondary diabetes mellitus, at baseline

## 2022-05-11 NOTE — Assessment & Plan Note (Signed)
Continue PPI ?

## 2022-05-11 NOTE — Subjective & Objective (Signed)
Reports Nausea and voming  No CP no SOB decreased appetite for 2 wks  Hx of gastroparesis and DM2 Recent admit for the same in January 2024

## 2022-05-11 NOTE — Assessment & Plan Note (Signed)
will admit per D HHS protocol, obtain serial BMET, start on glucosestabalizer,  iVF.      Monitor in [place]. Replace potassium as needed.     Consult diabetes coordinator

## 2022-05-11 NOTE — Assessment & Plan Note (Signed)
-  chronic avoid nephrotoxic medications such as NSAIDs, Vanco Zosyn combo,  avoid hypotension, continue to follow renal function  

## 2022-05-11 NOTE — Assessment & Plan Note (Addendum)
Patient now reports significant Pain in her lower extremities bilateral hips pain with attempt to pivot when she is standing up from wheelchair which is preventing her from ambulating She does not tolerate MRIs well but would be willing if her head does not have to go inside Patient has undergone CTA of thoracic lumbar and cervical spine in the emergency department which shows degenerative disease but no acute findings  Will attempt to order MRI of the lumbar spine  Recommend PT OT although patient seemed reluctant.  Recommended patient may need to get a to rehab facility because she lives at home and unable to ambulate patient was reluctant regarding that as well

## 2022-05-11 NOTE — Assessment & Plan Note (Signed)
Will need to have follow-up as an outpatient

## 2022-05-11 NOTE — Progress Notes (Signed)
Charge RN Ronalee Belts contacted provider on [employer], MD to clarify on next steps based on the patient BS remaining below 250 x 3 BS checks. RN and charge RN waiting on response from provider.

## 2022-05-11 NOTE — Assessment & Plan Note (Signed)
Patient states that that has not been bothering her as much fluid recently she just had decreased appetite and no BMs. Will evaluate for presence of constipation Noted possible tardive dyskinesia we will hold off on Reglan if able

## 2022-05-11 NOTE — TOC Progression Note (Signed)
Transition of Care [facility]) - Progression Note    Patient Details  Name: DENIS SKALKA MRN: [id number] Date of Birth: [birth year]  Transition of Care [facility]) CM/SW [place], LCSW Phone Number: 05/11/2022, 3:24 PM  Clinical Narrative:    CSW spoke with pt via phone regarding PT recommendation for SNF. Pt is adamantly refusing SNF. Pt stated she has gone to SNF before and will not go back. CSW explained the importance of STR at SNF. Pt began to yell and stated that she stay here and not go home until she is walking. Pt then hung up the phone on CSW.   CSW notified RN CM. RN CM will go speak with pt tomorrow in attempt to see if pt will be willing to agree to [facility] services.   TOC will continue to follow this admission.         Expected Discharge Plan and Services                                               Social Determinants of Health (SDOH) Interventions SDOH Screenings   Food Insecurity: No Food Insecurity (05/11/2022)  Housing: Low Risk  (05/11/2022)  Transportation Needs: No Transportation Needs (05/11/2022)  Utilities: Not At Risk (05/11/2022)  Depression (PHQ2-9): Medium Risk (09/16/2021)  Tobacco Use: Low Risk  (05/10/2022)    Readmission Risk Interventions     No data to display        Beckey Rutter, MSW, LCSWA, LCASA Transitions of Care  Clinical Social Worker I

## 2022-05-11 NOTE — Assessment & Plan Note (Signed)
Secondary to electrolyte shifts with hyperglycemia.  Replacing as needed.  Please note that hyponatremia is pseudohyponatremia in the context of hyperglycemia

## 2022-05-11 NOTE — Assessment & Plan Note (Signed)
Continue Protonix 40 mg a day

## 2022-05-11 NOTE — Assessment & Plan Note (Signed)
Improved.  Advancing diet to carb modified.  Suspect this may have more secondary to nonketotic hyperglycemia rather than diabetic gastroparesis.

## 2022-05-11 NOTE — ED Notes (Signed)
ED TO INPATIENT HANDOFF REPORT  ED Nurse Name and Phone #: [phone] Deeandra Jerry  S Name/Age/Gender Deborah [name] 57 y.o. female Room/Bed: 026C/026C  Code Status   Code Status: Full Code  Home/SNF/Other Home Patient oriented to: self, place, time, and situation Is this baseline? Yes      Chief Complaint Hyperosmolar hyperglycemic state (HHS) ([place]) [E11.00]  Triage Note BIB EMS from home, c/o abd pain n/v. Pain on back of right leg to neck. Uses wheelchair. Aaox4. Denies chest pain, sob. States has not had an appetite for 2 wks.    Allergies Allergies  Allergen Reactions   Diazepam [name]   Gabapentin [name] and Swelling    Other reaction(s): Unknown   Iodinated Contrast Media Anaphylaxis    11/29/17 Cardiac arrest 1 min after IV contrast, possible allergy vs vasovagal episode Iopamidol  Anaphylaxis  High 11/28/2017  Patient had seizure like activity and then code post 100 cc of isovue 300     Isovue [Iopamidol] Anaphylaxis    11/28/17 Patient had seizure like activity and then 1 min code after 100 cc of isovue 300. Possible contrast allergy vs vasovagal episode   Lisinopril Anaphylaxis    Tongue and mouth swelling   Metoclopramide Other (See Comments)    Tardive dyskinesia  Also known as Reglan    Nsaids Anaphylaxis and Other (See Comments)    ULCER   Penicillins Palpitations    Has patient had a PCN reaction causing immediate rash, facial/tongue/throat swelling, SOB or lightheadedness with hypotension: Yes, heart races Has patient had a PCN reaction causing severe rash involving mucus membranes or skin necrosis: No Has patient had a PCN reaction that required hospitalization: Yes  Has patient had a PCN reaction occurring within the last 10 years: No    Tolmetin Nausea Only and Other (See Comments)    ULCER   Dicyclomine Other (See Comments)    Chest pain   Acetaminophen Nausea Only and Other (See Comments)    Irritates stomach ulcer;  Abdominal pain   Cyclobenzaprine Palpitations   Oxycodone Palpitations   Rifamycins Palpitations   Tramadol Nausea And Vomiting    Level of Care/Admitting Diagnosis ED Disposition     ED Disposition  Admit   Condition  --   [place]: [place] [[id number]]  Level of Care: Progressive [102]  Admit to Progressive based on following criteria: CARDIOVASCULAR & THORACIC of moderate stability with acute coronary syndrome symptoms/low risk myocardial infarction/hypertensive urgency/arrhythmias/heart failure potentially compromising stability and stable post cardiovascular intervention patients.  May place patient in observation at [facility] or [facility] if equivalent level of care is available:: No  Covid Evaluation: Confirmed COVID Negative  Diagnosis: Hyperosmolar hyperglycemic state (HHS) [facility]) [id number]  Admitting Physician: Toy Baker [[id number]]  Attending Physician: Toy Baker [[id number]]          B Medical/Surgery History Past Medical History:  Diagnosis Date   Acute back pain with sciatica, left    Acute back pain with sciatica, right    AKI (acute kidney injury) ([place])    Anemia, unspecified    Cancer ([place])    Carcinoid tumor of duodenum    Chest pain with normal coronary angiography 2019   Chronic kidney disease, stage 3b (HCC)    Chronic pain    Chronic systolic CHF (congestive heart failure) ([place])    Diabetes mellitus    DKA (diabetic ketoacidosis) ([place])    Drug-seeking behavior    21  hospitalizations and 14 CT a/p in 2 years for N/V and abdominal pain, demanding only IV dilaudid   Elevated troponin    chronic   Esophageal reflux    Fibromyalgia    Gastric ulcer    Gastroparesis    Gout    Hyperlipidemia    Hypertension    Hypokalemia    Hypomagnesemia    Lumbosacral stenosis    LVH (left ventricular hypertrophy)    Morbid obesity (HCC)    NICM (nonischemic cardiomyopathy) (HCC)    PAF (paroxysmal atrial  fibrillation) ([place])    Stroke ([place]) 02/2011   Thrombocytosis    Vitamin B12 deficiency anemia    Past Surgical History:  Procedure Laterality Date   BIOPSY  07/27/2019   Procedure: BIOPSY;  Surgeon: Clarene Essex, MD;  [facility]: [facility];  Service: Endoscopy;;   BIOPSY  07/30/2019   Procedure: BIOPSY;  Surgeon: Otis Brace, MD;  Location: [facility];  Service: Gastroenterology;;   CATARACT EXTRACTION  01/2014   CHOLECYSTECTOMY     COLONOSCOPY WITH PROPOFOL N/A 07/30/2019   Procedure: COLONOSCOPY WITH PROPOFOL;  Surgeon: Otis Brace, MD;  [facility];  Service: Gastroenterology;  Laterality: N/A;   ESOPHAGOGASTRODUODENOSCOPY N/A 07/27/2019   Procedure: ESOPHAGOGASTRODUODENOSCOPY (EGD);  Surgeon: Clarene Essex, MD;  [facility];  Service: Endoscopy;  Laterality: N/A;   ESOPHAGOGASTRODUODENOSCOPY N/A 07/26/2020   Procedure: ESOPHAGOGASTRODUODENOSCOPY (EGD);  Surgeon: Arta Silence, MD;  Location: [facility];  Service: Endoscopy;  Laterality: N/A;   ESOPHAGOGASTRODUODENOSCOPY (EGD) WITH PROPOFOL N/A 08/02/2019   Procedure: ESOPHAGOGASTRODUODENOSCOPY (EGD) WITH PROPOFOL;  Surgeon: Otis Brace, MD;  [facility];  Service: Gastroenterology;  Laterality: N/A;   HEMOSTASIS CLIP PLACEMENT  08/02/2019   Procedure: HEMOSTASIS CLIP PLACEMENT;  Surgeon: Otis Brace, MD;  [facility];  Service: Gastroenterology;;   POLYPECTOMY  07/30/2019   Procedure: POLYPECTOMY;  Surgeon: Otis Brace, MD;  [facility];  Service: Gastroenterology;;   POLYPECTOMY  08/02/2019   Procedure: POLYPECTOMY;  Surgeon: Otis Brace, MD;  [facility];  Service: Gastroenterology;;     A IV Location/Drains/Wounds Patient Lines/Drains/Airways Status     Active Line/Drains/Airways     Name Placement date Placement time Site Days   Peripheral IV 03/17/22 22 G 2.5" Anterior;Left Forearm 03/17/22  2046  Forearm  55   Peripheral IV  05/10/22 20 G Anterior;Proximal;Right Antecubital 05/10/22  --  Antecubital  1   Peripheral IV 05/10/22 22 G 1.75" Left;Anterior Forearm 05/10/22  2320  Forearm  1            Intake/Output Last 24 hours  Intake/Output Summary (Last 24 hours) at 05/11/2022 0329 Last data filed at 05/11/2022 [id number] Gross per 24 hour  Intake 113.28 ml  Output --  Net 113.28 ml    Labs/Imaging Results for orders placed or performed during the hospital encounter of 05/10/22 (from the past 48 hour(s))  CBG monitoring, ED     Status: Abnormal   Collection Time: 05/10/22  7:19 PM  Result Value Ref Range   Glucose-Capillary 521 (HH) 70 - 99 mg/dL    Comment: Glucose reference range applies only to samples taken after fasting for at least 8 hours.  Resp panel by RT-PCR (RSV, Flu A&B, Covid) Anterior Nasal Swab     Status: None   Collection Time: 05/10/22  7:21 PM   Specimen: Anterior Nasal Swab  Result Value Ref Range   SARS Coronavirus 2 by RT PCR NEGATIVE NEGATIVE   Influenza A by PCR NEGATIVE NEGATIVE  Influenza B by PCR NEGATIVE NEGATIVE    Comment: (NOTE) The Xpert Xpress SARS-CoV-2/FLU/RSV plus assay is intended as an aid in the diagnosis of influenza from Nasopharyngeal swab specimens and should not be used as a sole basis for treatment. Nasal washings and aspirates are unacceptable for Xpert Xpress SARS-CoV-2/FLU/RSV testing.  Fact Sheet for Patients: EntrepreneurPulse.com.au  Fact Sheet for Healthcare Providers: IncredibleEmployment.be  This test is not yet approved or cleared by the [place] FDA and has been authorized for detection and/or diagnosis of SARS-CoV-2 by FDA under an Emergency Use Authorization (EUA). This EUA will remain in effect (meaning this test can be used) for the duration of the COVID-19 declaration under Section 564(b)(1) of the Act, 21 U.S.C. section 360bbb-3(b)(1), unless the authorization is terminated or revoked.      Resp Syncytial Virus by PCR NEGATIVE NEGATIVE    Comment: (NOTE) Fact Sheet for Patients: EntrepreneurPulse.com.au  Fact Sheet for Healthcare Providers: IncredibleEmployment.be  This test is not yet approved or cleared by the [place] FDA and has been authorized for detection and/or diagnosis of SARS-CoV-2 by FDA under an Emergency Use Authorization (EUA). This EUA will remain in effect (meaning this test can be used) for the duration of the COVID-19 declaration under Section 564(b)(1) of the Act, 21 U.S.C. section 360bbb-3(b)(1), unless the authorization is terminated or revoked.  Performed at [facility], [place] [address] [place], [place] [address]   CBC with Differential     Status: Abnormal   Collection Time: 05/10/22  8:38 PM  Result Value Ref Range   WBC 15.4 (H) 4.0 - 10.5 K/uL   RBC 2.71 (L) 3.87 - 5.11 MIL/uL   Hemoglobin 7.7 (L) 12.0 - 15.0 g/dL   HCT 25.2 (L) 36.0 - 46.0 %   MCV 93.0 80.0 - 100.0 fL   MCH 28.4 26.0 - 34.0 pg   MCHC 30.6 30.0 - 36.0 g/dL   RDW 13.7 11.5 - 15.5 %   Platelets 502 (H) 150 - 400 K/uL   nRBC 0.0 0.0 - 0.2 %   Neutrophils Relative % 83 %   Neutro Abs 12.8 (H) 1.7 - 7.7 K/uL   Lymphocytes Relative 10 %   Lymphs Abs 1.6 0.7 - 4.0 K/uL   Monocytes Relative 5 %   Monocytes Absolute 0.7 0.1 - 1.0 K/uL   Eosinophils Relative 1 %   Eosinophils Absolute 0.2 0.0 - 0.5 K/uL   Basophils Relative 0 %   Basophils Absolute 0.1 0.0 - 0.1 K/uL   Immature Granulocytes 1 %   Abs Immature Granulocytes 0.08 (H) 0.00 - 0.07 K/uL    Comment: Performed at [facility] [address], [place], [place] [address]  Troponin I (High Sensitivity)     Status: None   Collection Time: 05/10/22  8:38 PM  Result Value Ref Range   Troponin I (High Sensitivity) 12 <18 ng/L    Comment: (NOTE) Elevated high sensitivity troponin I (hsTnI) values and significant  changes across serial measurements may suggest ACS  but many other  chronic and acute conditions are known to elevate hsTnI results.  Refer to the "Links" section for chest pain algorithms and additional  guidance. Performed at [facility], [place] [address] [place], [place] [address]   Comprehensive metabolic panel     Status: Abnormal   Collection Time: 05/10/22  8:38 PM  Result Value Ref Range   Sodium 127 (L) 135 - 145 mmol/L   Potassium 4.3 3.5 -  5.1 mmol/L   Chloride 93 (L) 98 - 111 mmol/L   CO2 21 (L) 22 - 32 mmol/L   Glucose, Bld 540 (HH) 70 - 99 mg/dL    Comment: CRITICAL RESULT CALLED TO, READ BACK BY AND VERIFIED WITH A. Velda Wendt, RN, 2141,05/10/22, EADEDOKUN Glucose reference range applies only to samples taken after fasting for at least 8 hours.    BUN 82 (H) 6 - 20 mg/dL   Creatinine, Ser 3.67 (H) 0.44 - 1.00 mg/dL   Calcium 9.5 8.9 - 10.3 mg/dL   Total Protein 8.3 (H) 6.5 - 8.1 g/dL   Albumin 2.8 (L) 3.5 - 5.0 g/dL   AST 14 (L) 15 - 41 U/L   ALT 7 0 - 44 U/L   Alkaline Phosphatase 89 38 - 126 U/L   Total Bilirubin 0.6 0.3 - 1.2 mg/dL   GFR, Estimated 14 (L) >60 mL/min    Comment: (NOTE) Calculated using the CKD-EPI Creatinine Equation (2021)    Anion gap 13 5 - 15    Comment: Performed at [facility], [place] [address] [place], [place] [address]  Ethanol     Status: None   Collection Time: 05/10/22  8:38 PM  Result Value Ref Range   Alcohol, Ethyl (B) <10 <10 mg/dL    Comment: (NOTE) Lowest detectable limit for serum alcohol is 10 mg/dL.  For medical purposes only. Performed at [facility], [place] [address] [place], [place] [address]   Lipase, blood     Status: None   Collection Time: 05/10/22  8:38 PM  Result Value Ref Range   Lipase 42 11 - 51 U/L    Comment: Performed at [facility] [address], [place], [place] [address]  Brain natriuretic peptide     Status: None   Collection Time: 05/10/22  8:38 PM  Result Value Ref Range   B Natriuretic Peptide 24.5 0.0 - 100.0 pg/mL     Comment: Performed at [facility] [address] [place], [place] [address]  Magnesium     Status: Abnormal   Collection Time: 05/10/22  8:38 PM  Result Value Ref Range   Magnesium 1.6 (L) 1.7 - 2.4 mg/dL    Comment: Performed at [facility] [address] [place], [place] [address]  Phosphorus     Status: None   Collection Time: 05/10/22  8:38 PM  Result Value Ref Range   Phosphorus 3.3 2.5 - 4.6 mg/dL    Comment: Performed at [facility], [place] [address], [place], [place] [address]  I-Stat venous blood gas, ED     Status: Abnormal   Collection Time: 05/10/22  8:42 PM  Result Value Ref Range   pH, Ven 7.388 7.25 - 7.43   pCO2, Ven 35.7 (L) 44 - 60 mmHg   pO2, Ven 22 (LL) 32 - 45 mmHg   Bicarbonate 21.5 20.0 - 28.0 mmol/L   TCO2 23 22 - 32 mmol/L   O2 Saturation 38 %   Acid-base deficit 3.0 (H) 0.0 - 2.0 mmol/L   Sodium 128 (L) 135 - 145 mmol/L   Potassium 4.4 3.5 - 5.1 mmol/L   Calcium, Ion 1.16 1.15 - 1.40 mmol/L   HCT 27.0 (L) 36.0 - 46.0 %   Hemoglobin 9.2 (L) 12.0 - 15.0 g/dL   Sample type VENOUS    Comment NOTIFIED PHYSICIAN   CBG monitoring, ED     Status: Abnormal   Collection Time:  05/10/22 11:28 PM  Result Value Ref Range   Glucose-Capillary 445 (H) 70 - 99 mg/dL    Comment: Glucose reference range applies only to samples taken after fasting for at least 8 hours.  CBG monitoring, ED     Status: Abnormal   Collection Time: 05/11/22 12:43 AM  Result Value Ref Range   Glucose-Capillary 413 (H) 70 - 99 mg/dL    Comment: Glucose reference range applies only to samples taken after fasting for at least 8 hours.  Troponin I (High Sensitivity)     Status: None   Collection Time: 05/11/22 12:59 AM  Result Value Ref Range   Troponin I (High Sensitivity) 14 <18 ng/L    Comment: (NOTE) Elevated high sensitivity troponin I (hsTnI) values and significant  changes across serial measurements may suggest ACS but many other  chronic and acute conditions are  known to elevate hsTnI results.  Refer to the "Links" section for chest pain algorithms and additional  guidance. Performed at [facility], [place] [address], [place], [place] [id number]   Basic metabolic panel     Status: Abnormal   Collection Time: 05/11/22 12:59 AM  Result Value Ref Range   Sodium 127 (L) 135 - 145 mmol/L   Potassium 4.0 3.5 - 5.1 mmol/L   Chloride 95 (L) 98 - 111 mmol/L   CO2 21 (L) 22 - 32 mmol/L   Glucose, Bld 422 (H) 70 - 99 mg/dL    Comment: Glucose reference range applies only to samples taken after fasting for at least 8 hours.   BUN 76 (H) 6 - 20 mg/dL   Creatinine, Ser 3.29 (H) 0.44 - 1.00 mg/dL   Calcium 9.2 8.9 - 10.3 mg/dL   GFR, Estimated 16 (L) >60 mL/min    Comment: (NOTE) Calculated using the CKD-EPI Creatinine Equation (2021)    Anion gap 11 5 - 15    Comment: Performed at [facility] [address], [place], [place] [address]  CK     Status: None   Collection Time: 05/11/22 12:59 AM  Result Value Ref Range   Total CK 140 38 - 234 U/L    Comment: Performed at [facility], [place] [address], [place], [place] [address]  TSH     Status: None   Collection Time: 05/11/22 12:59 AM  Result Value Ref Range   TSH 1.390 0.350 - 4.500 uIU/mL    Comment: Performed by a 3rd Generation assay with a functional sensitivity of <=0.01 uIU/mL. Performed at [facility], [place] [address] [place], [place] [address]   Folate     Status: None   Collection Time: 05/11/22 12:59 AM  Result Value Ref Range   Folate 9.5 >5.9 ng/mL    Comment: Performed at [facility] [address], [place], [place] [address]  Reticulocytes     Status: Abnormal   Collection Time: 05/11/22 12:59 AM  Result Value Ref Range   Retic Ct Pct 2.6 0.4 - 3.1 %   RBC. 2.58 (L) 3.87 - 5.11 MIL/uL   Retic Count, Absolute 66.0 19.0 - 186.0 K/uL   Immature Retic Fract 19.7 (H) 2.3 - 15.9 %    Comment: Performed at [facility] [address]  [place], [place] [address]  CBC     Status: Abnormal   Collection Time: 05/11/22 12:59 AM  Result Value Ref Range   WBC 14.2 (H) 4.0 - 10.5 K/uL   RBC  2.61 (L) 3.87 - 5.11 MIL/uL   Hemoglobin 7.5 (L) 12.0 - 15.0 g/dL   HCT 23.4 (L) 36.0 - 46.0 %   MCV 89.7 80.0 - 100.0 fL   MCH 28.7 26.0 - 34.0 pg   MCHC 32.1 30.0 - 36.0 g/dL   RDW 13.5 11.5 - 15.5 %   Platelets 451 (H) 150 - 400 K/uL   nRBC 0.0 0.0 - 0.2 %    Comment: Performed at [facility] [address] [place], [place] [address]  CBG monitoring, ED     Status: Abnormal   Collection Time: 05/11/22  2:02 AM  Result Value Ref Range   Glucose-Capillary 348 (H) 70 - 99 mg/dL    Comment: Glucose reference range applies only to samples taken after fasting for at least 8 hours.  Type and screen [place]     Status: None   Collection Time: 05/11/22  2:20 AM  Result Value Ref Range   ABO/RH(D) O POS    Antibody Screen NEG    Sample Expiration      05/14/2022,2359 Performed at [facility], [place] [address] [place], [place] [address]   Lactic acid, plasma     Status: None   Collection Time: 05/11/22  2:25 AM  Result Value Ref Range   Lactic Acid, Venous 1.8 0.5 - 1.9 mmol/L    Comment: Performed at [facility] [address], [place], [place] [address]  Vitamin B12     Status: None   Collection Time: 05/11/22  2:25 AM  Result Value Ref Range   Vitamin B-12 295 180 - 914 pg/mL    Comment: (NOTE) This assay is not validated for testing neonatal or myeloproliferative syndrome specimens for Vitamin B12 levels. Performed at [facility], [place] [address], [place], [place] [address]   Iron and TIBC     Status: None   Collection Time: 05/11/22  2:25 AM  Result Value Ref Range   Iron 30 28 - 170 ug/dL   TIBC 266 250 - 450 ug/dL   Saturation Ratios 11 10.4 - 31.8 %   UIBC 236 ug/dL    Comment: Performed at [facility], [place] [address] [place], [place] [address]  Ferritin     Status:  Abnormal   Collection Time: 05/11/22  2:25 AM  Result Value Ref Range   Ferritin 825 (H) 11 - 307 ng/mL    Comment: Performed at [facility], [place] [address], [place], [place] [address]  CBG monitoring, ED     Status: Abnormal   Collection Time: 05/11/22  2:44 AM  Result Value Ref Range   Glucose-Capillary 294 (H) 70 - 99 mg/dL    Comment: Glucose reference range applies only to samples taken after fasting for at least 8 hours.   CT CHEST ABDOMEN PELVIS WO CONTRAST  Result Date: 05/10/2022 CLINICAL DATA:  Vomiting, weakness, and abdominal pain. EXAM: CT CHEST, ABDOMEN AND PELVIS WITHOUT CONTRAST TECHNIQUE: Multidetector CT imaging of the chest, abdomen and pelvis was performed following the standard protocol without IV contrast. RADIATION DOSE REDUCTION: This exam was performed according to the departmental dose-optimization program which includes automated exposure control, adjustment of the mA and/or kV according to patient size and/or use of iterative reconstruction technique. COMPARISON:  CT abdomen pelvis dated 03/17/2022. FINDINGS: Evaluation of this exam is limited in the absence of intravenous contrast as well as due to streak artifact caused by patient's  arms. CT CHEST FINDINGS Cardiovascular: There is no cardiomegaly or pericardial effusion. Coronary vascular calcification of the LAD and RCA. Mild atherosclerotic calcification of the thoracic aorta. No aneurysmal dilatation. The central pulmonary arteries are grossly unremarkable. Mediastinum/Nodes: No hilar or mediastinal adenopathy. The esophagus is grossly unremarkable. No mediastinal fluid collection. Lungs/Pleura: The lungs are clear. There is no pleural effusion or pneumothorax. The central airways are patent. Musculoskeletal: Degenerative changes of the spine. No acute osseous pathology. CT ABDOMEN PELVIS FINDINGS No intra-abdominal free air or free fluid. Hepatobiliary: The liver is grossly unremarkable but suboptimally visualized  due to streak artifact caused by patient's arms. Cholecystectomy. Pancreas: Unremarkable. No pancreatic ductal dilatation or surrounding inflammatory changes. Spleen: Normal in size without focal abnormality. Adrenals/Urinary Tract: The adrenal glands are unremarkable. There is no hydronephrosis or nephrolithiasis on either side. The visualized ureters and urinary bladder appear unremarkable. Stomach/Bowel: There is no bowel obstruction or active inflammation. The appendix is normal. Vascular/Lymphatic: Mild atherosclerotic calcification of the abdominal aorta. The IVC is unremarkable. No portal venous gas. There is no adenopathy. Reproductive: The uterus is anteverted. Similar appearance of a 4 cm left uterine fundus exophytic fibroid. Other: None Musculoskeletal: Degenerative changes of the spine. No acute osseous pathology. IMPRESSION: 1. No acute intrathoracic, abdominal, or pelvic pathology. 2. No bowel obstruction. Normal appendix. 3.  Aortic Atherosclerosis (ICD10-I70.0). Electronically Signed   By: Anner Crete M.D.   On: 05/10/2022 23:32   CT L-SPINE NO CHARGE  Result Date: 05/10/2022 CLINICAL DATA:  Right-sided pain and generalized weakness. EXAM: CT THORACIC AND LUMBAR SPINE WITHOUT CONTRAST TECHNIQUE: Multidetector CT imaging of the thoracic and lumbar spine was performed without contrast. Multiplanar CT image reconstructions were also generated. RADIATION DOSE REDUCTION: This exam was performed according to the departmental dose-optimization program which includes automated exposure control, adjustment of the mA and/or kV according to patient size and/or use of iterative reconstruction technique. COMPARISON:  CTA chest abdomen pelvis dated 05/10/2022 and CT abdomen pelvis dated 03/17/2022. FINDINGS: CT THORACIC SPINE FINDINGS Alignment: No acute subluxation. Vertebrae: No acute fracture. Paraspinal and other soft tissues: Negative. Disc levels: No acute findings. Multilevel degenerative changes  most prominent at T7-T8 with disc space narrowing, endplate irregularity and spurring/osteophyte. CT LUMBAR SPINE FINDINGS Segmentation: 5 lumbar type vertebrae. Alignment: No acute subluxation.  Grade 1 L4-L5 anterolisthesis. Vertebrae: No acute fracture. Paraspinal and other soft tissues: Negative. Disc levels: No acute findings. Degenerative changes primarily at L5-S1 with anterior osteophyte. IMPRESSION: 1. No acute/traumatic thoracic or lumbar spine pathology. 2. Degenerative changes. Electronically Signed   By: Anner Crete M.D.   On: 05/10/2022 23:25   CT T-SPINE NO CHARGE  Result Date: 05/10/2022 CLINICAL DATA:  Right-sided pain and generalized weakness. EXAM: CT THORACIC AND LUMBAR SPINE WITHOUT CONTRAST TECHNIQUE: Multidetector CT imaging of the thoracic and lumbar spine was performed without contrast. Multiplanar CT image reconstructions were also generated. RADIATION DOSE REDUCTION: This exam was performed according to the departmental dose-optimization program which includes automated exposure control, adjustment of the mA and/or kV according to patient size and/or use of iterative reconstruction technique. COMPARISON:  CTA chest abdomen pelvis dated 05/10/2022 and CT abdomen pelvis dated 03/17/2022. FINDINGS: CT THORACIC SPINE FINDINGS Alignment: No acute subluxation. Vertebrae: No acute fracture. Paraspinal and other soft tissues: Negative. Disc levels: No acute findings. Multilevel degenerative changes most prominent at T7-T8 with disc space narrowing, endplate irregularity and spurring/osteophyte. CT LUMBAR SPINE FINDINGS Segmentation: 5 lumbar type vertebrae. Alignment: No acute subluxation.  Grade 1 L4-L5 anterolisthesis. Vertebrae:  No acute fracture. Paraspinal and other soft tissues: Negative. Disc levels: No acute findings. Degenerative changes primarily at L5-S1 with anterior osteophyte. IMPRESSION: 1. No acute/traumatic thoracic or lumbar spine pathology. 2. Degenerative changes.  Electronically Signed   By: Anner Crete M.D.   On: 05/10/2022 23:25   CT Head Wo Contrast  Result Date: 05/10/2022 CLINICAL DATA:  Weakness and pain, initial encounter EXAM: CT HEAD WITHOUT CONTRAST CT CERVICAL SPINE WITHOUT CONTRAST TECHNIQUE: Multidetector CT imaging of the head and cervical spine was performed following the standard protocol without intravenous contrast. Multiplanar CT image reconstructions of the cervical spine were also generated. RADIATION DOSE REDUCTION: This exam was performed according to the departmental dose-optimization program which includes automated exposure control, adjustment of the mA and/or kV according to patient size and/or use of iterative reconstruction technique. COMPARISON:  None Available. FINDINGS: CT HEAD FINDINGS Brain: No evidence of acute infarction, hemorrhage, hydrocephalus, extra-axial collection or mass lesion/mass effect. Mild atrophic changes are noted. Vascular: No hyperdense vessel or unexpected calcification. Skull: Normal. Negative for fracture or focal lesion. Sinuses/Orbits: No acute finding. Other: None. CT CERVICAL SPINE FINDINGS Alignment: Mild straightening of the normal cervical lordosis is noted. Skull base and vertebrae: In cervical segments are well visualized. Vertebral body height is well maintained. No acute fracture or acute facet abnormality is noted. Mild osteophytic changes are noted worst at C5-6. Posterior fusion defect is noted at C1 posteriorly felt to be congenital in nature. The odontoid is within normal limits. Soft tissues and spinal canal: Surrounding soft tissue structures are within normal limits. Upper chest: Visualized lung apices are within normal limits. Other: None IMPRESSION: CT of the head: No acute intracranial abnormality noted. Mild atrophic changes are noted. CT of the cervical spine: Mild degenerative change without acute abnormality. Electronically Signed   By: Inez Catalina M.D.   On: 05/10/2022 23:21   CT  Cervical Spine Wo Contrast  Result Date: 05/10/2022 CLINICAL DATA:  Weakness and pain, initial encounter EXAM: CT HEAD WITHOUT CONTRAST CT CERVICAL SPINE WITHOUT CONTRAST TECHNIQUE: Multidetector CT imaging of the head and cervical spine was performed following the standard protocol without intravenous contrast. Multiplanar CT image reconstructions of the cervical spine were also generated. RADIATION DOSE REDUCTION: This exam was performed according to the departmental dose-optimization program which includes automated exposure control, adjustment of the mA and/or kV according to patient size and/or use of iterative reconstruction technique. COMPARISON:  None Available. FINDINGS: CT HEAD FINDINGS Brain: No evidence of acute infarction, hemorrhage, hydrocephalus, extra-axial collection or mass lesion/mass effect. Mild atrophic changes are noted. Vascular: No hyperdense vessel or unexpected calcification. Skull: Normal. Negative for fracture or focal lesion. Sinuses/Orbits: No acute finding. Other: None. CT CERVICAL SPINE FINDINGS Alignment: Mild straightening of the normal cervical lordosis is noted. Skull base and vertebrae: In cervical segments are well visualized. Vertebral body height is well maintained. No acute fracture or acute facet abnormality is noted. Mild osteophytic changes are noted worst at C5-6. Posterior fusion defect is noted at C1 posteriorly felt to be congenital in nature. The odontoid is within normal limits. Soft tissues and spinal canal: Surrounding soft tissue structures are within normal limits. Upper chest: Visualized lung apices are within normal limits. Other: None IMPRESSION: CT of the head: No acute intracranial abnormality noted. Mild atrophic changes are noted. CT of the cervical spine: Mild degenerative change without acute abnormality. Electronically Signed   By: Inez Catalina M.D.   On: 05/10/2022 23:21    Pending Labs [employer] (  From admission, onward)     Start      Ordered   05/11/22 0500  Comprehensive metabolic panel  Tomorrow morning,   R       Question:  Release to patient  Answer:  Immediate   05/11/22 0228   05/11/22 0500  CBC  Tomorrow morning,   R       Question:  Release to patient  Answer:  Immediate   05/11/22 0228   05/11/22 0500  Magnesium  Tomorrow morning,   R        05/11/22 0228   05/11/22 0500  Phosphorus  Tomorrow morning,   R        05/11/22 0228   05/11/22 0058  Lactic acid, plasma  STAT Now then every 3 hours,   R     Question:  Release to patient  Answer:  Immediate   05/11/22 0058   05/10/22 1923  Blood gas, venous  Once,   R        05/10/22 1922   05/10/22 1921  Urinalysis, Routine w reflex microscopic -Urine, Clean Catch  Once,   URGENT       Question:  Specimen Source  Answer:  Urine, Clean Catch   05/10/22 1921   05/10/22 1921  Urine rapid drug screen (hosp performed)  Once,   STAT        05/10/22 1921   Unscheduled  Occult blood card to lab, stool RN will collect  As needed,   R     Question Answer Comment  Specimen to be collected by: RN will collect   Release to patient Immediate      05/11/22 0113            Vitals/Pain Today's Vitals   05/11/22 0030 05/11/22 0045 05/11/22 0130 05/11/22 0305  BP: 128/67 137/86 (!) 104/95   Pulse: 98 98 (!) 101   Resp: '11 16 18   '$ Temp:      TempSrc:      SpO2: 97% 100% 100%   Weight:      Height:      PainSc:    8     Isolation Precautions No active isolations  Medications Medications  insulin regular, human (MYXREDLIN) 100 units/ 100 mL infusion (10.5 Units/hr Intravenous Rate/Dose Change 05/11/22 0204)  lactated ringers infusion (0 mLs Intravenous Stopped 05/11/22 0253)  dextrose 50 % solution 0-50 mL (has no administration in time range)  albuterol (PROVENTIL) (2.5 MG/3ML) 0.083% nebulizer solution 2.5 mg (has no administration in time range)  apixaban (ELIQUIS) tablet 5 mg (5 mg Oral Given 05/11/22 0305)  atorvastatin (LIPITOR) tablet 10 mg (has no  administration in time range)  loratadine (CLARITIN) tablet 10 mg (has no administration in time range)  pantoprazole (PROTONIX) EC tablet 40 mg (40 mg Oral Given 05/11/22 0305)  senna-docusate (Senokot-S) tablet 1 tablet (has no administration in time range)  sodium bicarbonate tablet 650 mg (has no administration in time range)  0.9 %  sodium chloride infusion ( Intravenous New Bag/Given 05/11/22 0318)  polyethylene glycol (MIRALAX / GLYCOLAX) packet 17 g (has no administration in time range)  HYDROmorphone (DILAUDID) tablet 4 mg (4 mg Oral Given 05/11/22 0305)  lactated ringers bolus 1,000 mL (0 mLs Intravenous Stopped 05/11/22 0023)  pantoprazole (PROTONIX) injection 40 mg (40 mg Intravenous Given 05/10/22 2053)  HYDROmorphone (DILAUDID) injection 1 mg (1 mg Intravenous Given 05/10/22 2049)  ondansetron (ZOFRAN) injection 4 mg (4 mg Intravenous Given 05/10/22 2052)  potassium  chloride SA (KLOR-CON M) CR tablet 40 mEq (40 mEq Oral Given 05/10/22 2336)  magnesium sulfate IVPB 2 g 50 mL (0 g Intravenous Stopped 05/11/22 0024)  HYDROmorphone (DILAUDID) injection 0.5 mg (0.5 mg Intravenous Given 05/10/22 2337)    Mobility manual wheelchair     Focused Assessments Cardiac Assessment Handoff:  Cardiac Rhythm: (S) Sinus tachycardia Lab Results  Component Value Date   CKTOTAL 140 05/11/2022   CKMB 3.0 03/24/2010   TROPONINI <0.03 07/17/2018   Lab Results  Component Value Date   DDIMER 0.47 08/03/2021   Does the Patient currently have chest pain? No   , Pulmonary Assessment Handoff:  Lung sounds: Bilateral Breath Sounds: Clear O2 Device: Room Air      R Recommendations: See Admitting Provider Note  Report given to:   Additional Notes: non ambulatory at baseline; on insulin drip.

## 2022-05-11 NOTE — Evaluation (Signed)
Occupational Therapy Evaluation Patient Details Name: Deborah [name] MRN: [id number] DOB: 1965-06-05 Today's Date: 05/11/2022   History of Present Illness 57 y.o. female with medical history significant of DM2, CKD IV, A.fib    Gastric ulcer, carcinoid tumor of the duodenum, systolic CHF EF A999333, HTN    Admitted for   HHS, leg pain possible sciatica   Clinical Impression   Patient admitted for the diagnosis above.  PTA she lives alone in a one bedroom apartment with assist M-Sat for a few hours form a HHA.  Typically HHA assists with iADL and community mobility, but recently needing assist with dressing due to pain.  Pain, dizziness and poor activity tolerance are the primary deficits.  Currently she is needing Max A for lower body ADL, and assist as needed and increased time for basic bed mobility.  Patient has not been able to practice stand pivot transfers  due to pain.  OT to follow in the acute setting to address deficits, and HH OT can be considered depending on progress.       Recommendations for follow up therapy are one component of a multi-disciplinary discharge planning process, led by the attending physician.  Recommendations may be updated based on patient status, additional functional criteria and insurance authorization.   Follow Up Recommendations  Home health OT     Assistance Recommended at Discharge Intermittent Supervision/Assistance  Patient can return home with the following Assist for transportation;Assistance with cooking/housework;A lot of help with walking and/or transfers;A lot of help with bathing/dressing/bathroom    Functional Status Assessment  Patient has had a recent decline in their functional status and demonstrates the ability to make significant improvements in function in a reasonable and predictable amount of time.  Equipment Recommendations  BSC/3in1    Recommendations for Other Services       Precautions / Restrictions  Precautions Precautions: Fall Restrictions Weight Bearing Restrictions: No      Mobility Bed Mobility Overal bed mobility: Needs Assistance Bed Mobility: Rolling Rolling: Min guard         General bed mobility comments: able to scoot higher in bed with Mod A    Transfers                   General transfer comment: Pt declined due to pain      Balance                                           ADL either performed or assessed with clinical judgement   ADL   Eating/Feeding: Set up;Sitting   Grooming: Wash/dry hands;Wash/dry face;Set up;Bed level           Upper Body Dressing : Minimal assistance;Sitting   Lower Body Dressing: Maximal assistance;Bed level                       Vision Patient Visual Report: No change from baseline Additional Comments: recent cataract surgery     Perception     Praxis      Pertinent Vitals/Pain Pain Assessment Pain Assessment: 0-10 Pain Score: 7  Pain Location: legs and arms Pain Descriptors / Indicators: Constant, Shooting Pain Intervention(s): Monitored during session     Hand Dominance Right   Extremity/Trunk Assessment Upper Extremity Assessment Upper Extremity Assessment: LUE deficits/detail;Generalized weakness LUE Deficits / Details: swelling with limited ability to  c;ose her L hand LUE Sensation: decreased light touch LUE Coordination: decreased fine motor   Lower Extremity Assessment Lower Extremity Assessment: Defer to PT evaluation   Cervical / Trunk Assessment Cervical / Trunk Assessment: Kyphotic   Communication Communication Communication: No difficulties   Cognition Arousal/Alertness: Awake/alert Behavior During Therapy: WFL for tasks assessed/performed Overall Cognitive Status: Within Functional Limits for tasks assessed                                       General Comments  VSS on RA. Pt very limited by back pain.    Exercises      Shoulder Instructions      Home Living Family/patient expects to be discharged to:: Private residence Living Arrangements: Alone Available Help at Discharge: Personal care attendant;Available PRN/intermittently Type of Home: Apartment Home Access: Stairs to enter Entrance Stairs-Number of Steps: 2 Entrance Stairs-Rails: None Home Layout: One level     Bathroom Shower/Tub: Teacher, early years/pre: Standard Bathroom Accessibility: Yes   Home Equipment: BSC/3in1;Shower seat;Wheelchair - manual   Additional Comments: Pt would like a bariatric BSC ordered. States that her current one doesnt work.      Prior Functioning/Environment Prior Level of Function : Needs assist;History of Falls (last six months)       Physical Assist : Mobility (physical);ADLs (physical)     Mobility Comments: Pt reports using WC at all times however is able to transfer on her own OOB and to toilet without AD. ADLs Comments: Pt reports that she is mostly independent but her aide helps out with dressing and cleaning as needed.        OT Problem List: Decreased strength;Decreased range of motion;Decreased activity tolerance;Impaired balance (sitting and/or standing);Impaired UE functional use;Impaired sensation;Pain      OT Treatment/Interventions: Self-care/ADL training;Therapeutic activities;Therapeutic exercise;Patient/family education;Balance training    OT Goals(Current goals can be found in the care plan section) Acute Rehab OT Goals Patient Stated Goal: Return home OT Goal Formulation: With patient Time For Goal Achievement: 05/25/22 Potential to Achieve Goals: Good ADL Goals Pt Will Perform Grooming: with set-up;sitting Pt Will Perform Lower Body Dressing: with min assist;sit to/from stand Pt Will Transfer to Toilet: with min guard assist;squat pivot transfer;bedside commode Pt/caregiver will Perform Home Exercise Program: Increased ROM;Left upper extremity;With  Supervision;Increased strength;Both right and left upper extremity  OT Frequency: Min 2X/week    Co-evaluation              AM-PAC OT "6 Clicks" Daily Activity     Outcome Measure Help from another person eating meals?: A Little Help from another person taking care of personal grooming?: A Little Help from another person toileting, which includes using toliet, bedpan, or urinal?: A Lot Help from another person bathing (including washing, rinsing, drying)?: A Lot Help from another person to put on and taking off regular upper body clothing?: A Little Help from another person to put on and taking off regular lower body clothing?: A Lot 6 Click Score: 15   End of Session Nurse Communication: Mobility status  Activity Tolerance: Patient limited by pain Patient left: in bed;with call bell/phone within reach  OT Visit Diagnosis: Unsteadiness on feet (R26.81);Pain Pain - Right/Left: Left Pain - part of body: Shoulder;Arm;Hand                Time: [id number] OT Time Calculation (min): 21 min Charges:  OT  General Charges $OT Visit: 1 Visit OT Evaluation $OT Eval Moderate Complexity: 1 Mod  05/11/2022  RP, OTR/L  Acute Rehabilitation Services  Office:  [phone]   Metta Clines 05/11/2022, 2:58 PM

## 2022-05-11 NOTE — Assessment & Plan Note (Signed)
A1c about 2 months ago at 10.1.  When asked if she takes her insulin consistently, patient states most of the time.  Initial insulin drip and able to be weaned off.  Discussed with diabetes coordinator.  Patient normally takes 50 units of Lantus at home nightly.  Have started 30 units at 12 noon on 3/5.  Would favor increasing Lantus on 3/6 with next dose to be given in mid afternoon around 2 to 3 PM.  Goal is to titrate up Lantus to home dose and slowly give further out until she is on nightly insulin.

## 2022-05-11 NOTE — Assessment & Plan Note (Signed)
Secondary to chronic renal disease.  Patient's baseline is around 7.5, currently stable.  She had an isolated erroneous lab on night of 3/4 at 9.2

## 2022-05-11 NOTE — Assessment & Plan Note (Signed)
Continue for now Eliquis Monitor for any sign of bleeding If blood pressure allows continue toprolol 50 mg a day

## 2022-05-11 NOTE — Progress Notes (Signed)
Initial Nutrition Assessment  DOCUMENTATION CODES:  Morbid obesity  INTERVENTION:  Boost Breeze po BID, each supplement provides 250 kcal and 9 grams of protein 30 ml ProSource Plus BID, each supplement provides 100 kcals and 15 grams protein.  MVI with minerals daily Monitor for diet advancement  NUTRITION DIAGNOSIS:  Inadequate oral intake related to acute illness, decreased appetite as evidenced by per patient/family report.  GOAL:  Patient will meet greater than or equal to 90% of their needs  MONITOR:  Supplement acceptance, Diet advancement, Labs, Weight trends  REASON FOR ASSESSMENT:  Consult Assessment of nutrition requirement/status  ASSESSMENT:  Pt admitted with n/v and abdominal pain with admission 1/10-1/15 with similar presentation. PMH significant for T2DM, gastroparesis, CKD IV, afib, gastric ulcer, carcinoid tumor of the duodenum, systolic CHF EF A999333, HTN.  Per H&P, pt noted to have a recent decrease in appetite and no BM's. RD working remotely. Spoke with pt via phone call to room. She reports continuing to feel unwell with pain today.  Within the last week, she has eaten very minimally. She endorses intermittent n/v. Denies current n/v. She is uncertain when her appetite and PO intake began to decrease but has noticed this at least within the last 2 weeks. Despite poor PO intake she reports continuing to check her blood sugar.   No documented meal completions. Pt remains on a clear liquid diet. Given this provides inadequate nutrients including protein, she would benefit from the addition of nutrition supplements. Pt is agreeable. Will monitor for acceptance and diet advancement and adjust ONS as appropriate.   Pt states that she is uncertain of any recent weight changes. There is limited documentation within the last year. Uncertain of pt's dry weight as she is noted to have edema per nursing documentation. Her current weight is documented to be 133.8 kg. However  her weight on 01/13 was reported to be 115.2 kg.   Edema: non-pitting BUE  Medications: protonix, senna, sodium bicarbonate IV drips: insulin    Labs: sodium 134, BUN 73, Cr 3.44, anion gap 16, AST 11, GFR 15, HgbA1c 10.1%, CBG's 133-348 x12 hours  NUTRITION - FOCUSED PHYSICAL EXAM: RD working remotely. Deferred to follow up.   Diet Order:   Diet Order             Diet clear liquid Room service appropriate? Yes; Fluid consistency: Thin  Diet effective now                   EDUCATION NEEDS:  No education needs have been identified at this time  Skin:  Skin Assessment: Reviewed RN Assessment  Last BM:  3/4  Height:  Ht Readings from Last 1 Encounters:  05/11/22 5' 5.98" (1.676 m)    Weight:  Wt Readings from Last 1 Encounters:  05/11/22 133.8 kg    Ideal Body Weight:  59.1 kg  BMI:  Body mass index is 47.63 kg/m.  Estimated Nutritiona Needs:   Kcal:  1500-1700  Protein:  80-95g  Fluid:  >/=1.5L  Clayborne Dana, RDN, LDN Clinical Nutrition

## 2022-05-11 NOTE — Progress Notes (Signed)
pt recieved this eliquis dose at 0305, instructed by MD to hold AM dose.

## 2022-05-11 NOTE — Progress Notes (Signed)
The patient arrived from ER via stretcher with Elmo Putt, Therapist, sports. The patient arrived on insulin drip wit NaCl running at 75 ml/hr per provider order.

## 2022-05-11 NOTE — Assessment & Plan Note (Signed)
Will need to continue to follow-up as an outpatient

## 2022-05-11 NOTE — Assessment & Plan Note (Signed)
Restart hydralazine 25 mg 3 times daily if blood pressure allows

## 2022-05-12 ENCOUNTER — Inpatient Hospital Stay (HOSPITAL_COMMUNITY): Payer: 59

## 2022-05-12 ENCOUNTER — Encounter: Payer: 59 | Admitting: Registered Nurse

## 2022-05-12 DIAGNOSIS — E1100 Type 2 diabetes mellitus with hyperosmolarity without nonketotic hyperglycemic-hyperosmolar coma (NKHHC): Secondary | ICD-10-CM | POA: Diagnosis not present

## 2022-05-12 DIAGNOSIS — I10 Essential (primary) hypertension: Secondary | ICD-10-CM | POA: Diagnosis not present

## 2022-05-12 DIAGNOSIS — R112 Nausea with vomiting, unspecified: Secondary | ICD-10-CM

## 2022-05-12 DIAGNOSIS — N184 Chronic kidney disease, stage 4 (severe): Secondary | ICD-10-CM

## 2022-05-12 DIAGNOSIS — M48062 Spinal stenosis, lumbar region with neurogenic claudication: Secondary | ICD-10-CM | POA: Diagnosis not present

## 2022-05-12 LAB — COMPREHENSIVE METABOLIC PANEL WITH GFR
ALT: 8 U/L (ref 0–44)
AST: 12 U/L — ABNORMAL LOW (ref 15–41)
Albumin: 2.4 g/dL — ABNORMAL LOW (ref 3.5–5.0)
Alkaline Phosphatase: 84 U/L (ref 38–126)
Anion gap: 11 (ref 5–15)
BUN: 69 mg/dL — ABNORMAL HIGH (ref 6–20)
CO2: 19 mmol/L — ABNORMAL LOW (ref 22–32)
Calcium: 9.3 mg/dL (ref 8.9–10.3)
Chloride: 102 mmol/L (ref 98–111)
Creatinine, Ser: 3.28 mg/dL — ABNORMAL HIGH (ref 0.44–1.00)
GFR, Estimated: 16 mL/min — ABNORMAL LOW (ref >60)
Glucose, Bld: 265 mg/dL — ABNORMAL HIGH (ref 70–99)
Potassium: 3.9 mmol/L (ref 3.5–5.1)
Sodium: 132 mmol/L — ABNORMAL LOW (ref 135–145)
Total Bilirubin: 0.5 mg/dL (ref 0.3–1.2)
Total Protein: 7.4 g/dL (ref 6.5–8.1)

## 2022-05-12 LAB — CBC WITH DIFFERENTIAL/PLATELET
Abs Immature Granulocytes: 0.06 10*3/uL (ref 0.00–0.07)
Basophils Absolute: 0.1 10*3/uL (ref 0.0–0.1)
Basophils Relative: 1 %
Eosinophils Absolute: 0.3 10*3/uL (ref 0.0–0.5)
Eosinophils Relative: 2 %
HCT: 22 % — ABNORMAL LOW (ref 36.0–46.0)
Hemoglobin: 7 g/dL — ABNORMAL LOW (ref 12.0–15.0)
Immature Granulocytes: 1 %
Lymphocytes Relative: 17 %
Lymphs Abs: 2.2 10*3/uL (ref 0.7–4.0)
MCH: 28.9 pg (ref 26.0–34.0)
MCHC: 31.8 g/dL (ref 30.0–36.0)
MCV: 90.9 fL (ref 80.0–100.0)
Monocytes Absolute: 0.7 10*3/uL (ref 0.1–1.0)
Monocytes Relative: 6 %
Neutro Abs: 9.7 10*3/uL — ABNORMAL HIGH (ref 1.7–7.7)
Neutrophils Relative %: 73 %
Platelets: 492 10*3/uL — ABNORMAL HIGH (ref 150–400)
RBC: 2.42 MIL/uL — ABNORMAL LOW (ref 3.87–5.11)
RDW: 13.7 % (ref 11.5–15.5)
WBC: 13 10*3/uL — ABNORMAL HIGH (ref 4.0–10.5)
nRBC: 0 % (ref 0.0–0.2)

## 2022-05-12 LAB — GLUCOSE, CAPILLARY
Glucose-Capillary: 335 mg/dL — ABNORMAL HIGH (ref 70–99)
Glucose-Capillary: 221 mg/dL — ABNORMAL HIGH (ref 70–99)
Glucose-Capillary: 278 mg/dL — ABNORMAL HIGH (ref 70–99)
Glucose-Capillary: 275 mg/dL — ABNORMAL HIGH (ref 70–99)

## 2022-05-12 MED ORDER — HYDROMORPHONE HCL 1 MG/ML IJ SOLN
0.5000 mg | INTRAMUSCULAR | Status: DC | PRN
  Administered 2022-05-12 – 2022-05-15 (×14): 0.5 mg via INTRAVENOUS
  Filled 2022-05-12 (×14): qty 0.5

## 2022-05-12 MED ORDER — INSULIN GLARGINE-YFGN 100 UNIT/ML ~~LOC~~ SOLN
45.0000 [IU] | Freq: Every day | SUBCUTANEOUS | Status: DC
  Administered 2022-05-12: 45 [IU] via SUBCUTANEOUS
  Filled 2022-05-12 (×3): qty 0.45

## 2022-05-12 MED ORDER — INSULIN ASPART 100 UNIT/ML IJ SOLN
3.0000 [IU] | Freq: Three times a day (TID) | INTRAMUSCULAR | Status: DC
  Administered 2022-05-12 (×2): 3 [IU] via SUBCUTANEOUS

## 2022-05-12 NOTE — Progress Notes (Addendum)
Inpatient Diabetes Program Recommendations  AACE/ADA: New Consensus Statement on Inpatient Glycemic Control (2015)  Target Ranges:  Prepandial:   less than 140 mg/dL      Peak postprandial:   less than 180 mg/dL (1-2 hours)      Critically ill patients:  140 - 180 mg/dL   Lab Results  Component Value Date   GLUCAP 335 (H) 05/12/2022   HGBA1C 10.1 (H) 03/21/2022    Review of Glycemic Control  Latest Reference Range & Units 05/11/22 10:23 05/11/22 13:14 05/11/22 16:41 05/11/22 21:08 05/12/22 05:54  Glucose-Capillary 70 - 99 mg/dL 205 (H) 204 (H) 329 (H) 333 (H) 335 (H)   Diabetes history: DM 2 Outpatient Diabetes medications:  Lantus 50 units q HS, Humalog 7-30 units tid with meals?? Current orders for Inpatient glycemic control:  Novolog 0-20 units tid with meals and HS  Inpatient Diabetes Program Recommendations:    Please add Semglee 45 units daily and add Novolog meal coverage 5 units tid with meals (hold if patient eats less than 50% or NPO).    Thanks,  Adah Perl, RN, BC-ADM Inpatient Diabetes Coordinator Pager [phone]  (8a-5p)

## 2022-05-12 NOTE — TOC Progression Note (Signed)
Transition of Care [facility]) - Progression Note    Patient Details  Name: MAHDIA KOR MRN: [id number] Date of Birth: 09/17/1965  [facility] CM/SW Contact  Zenon Mayo, RN Phone Number: 05/12/2022, 3:35 PM  Clinical Narrative:    NCM was informed that patient refused SNF,  NCM spoke with patient at the bedside, offered choice for [facility], HHPT, and Education officer, museum.  She states she does not have a preference.  NCM made referral to [facility] with [place], she is able to take referral.  Soc will begin 24 to 48 hrs post dc.  Patient has an aide at home from 12:30 to 3:30 everyday and then on Saturdays for 2.5 hrs .  She states she will need a bariatric BSC where the arms drop down where she can scoot across. She has a w/chair at home.  She states her mother lives close by and she cooks for her and she eats a lot of microwave foods as well.  She will need ambulance transport at dc. Patient states she does not have a preference for the DME agency either.          Expected Discharge Plan and Services                                               Social Determinants of Health (SDOH) Interventions SDOH Screenings   Food Insecurity: No Food Insecurity (05/11/2022)  Housing: Low Risk  (05/11/2022)  Transportation Needs: No Transportation Needs (05/11/2022)  Utilities: Not At Risk (05/11/2022)  Depression (PHQ2-9): Medium Risk (09/16/2021)  Tobacco Use: Low Risk  (05/10/2022)    Readmission Risk Interventions     No data to display

## 2022-05-12 NOTE — Plan of Care (Signed)

## 2022-05-12 NOTE — TOC Initial Note (Signed)
Transition of Care [facility]) - Initial/Assessment Note    Patient Details  Name: Deborah [name] MRN: [id number] Date of Birth: Feb 10, 1966  [facility] CM/SW Contact:    Zenon Mayo, RN Phone Number: 05/12/2022, 3:50 PM  Clinical Narrative:                 NCM was informed that patient refused SNF, NCM spoke with patient at the bedside, offered choice for [facility], HHPT, and Education officer, museum. She states she does not have a preference. NCM made referral to [facility] with [place], she is able to take referral. Soc will begin 24 to 48 hrs post dc. Patient has an aide at home from 12:30 to 3:30 everyday and then on Saturdays for 2.5 hrs . She states she will need a bariatric BSC where the arms drop down where she can scoot across. She has a w/chair at home. She states her mother lives close by and she cooks for her and she eats a lot of microwave foods as well. She will need ambulance transport at dc. Patient states she does not have a preference for the DME agency either.   Expected Discharge Plan: [place] Barriers to Discharge: Continued Medical Work up   Patient Goals and CMS Choice Patient states their goals for this hospitalization and ongoing recovery are:: return home CMS Medicare.gov Compare Post Acute Care list provided to:: Patient Choice offered to / list presented to : Patient      Expected Discharge Plan and Services In-house Referral: NA Discharge Planning Services: CM Consult Post Acute Care Choice: [place] arrangements for the past 2 months: Single Family Home                 DME Arranged: Bedside commode DME Agency: [employer] Date DME Agency Contacted: 05/12/22 Time DME Agency Contacted: [id number] Representative spoke with at DME Agency: Erasmo Downer HH Arranged: RN, Disease Management, PT, Social Work [employer] Agency: [place] Date [place]: 05/12/22 Time [place]: [place] Representative spoke with  at [place]: [place] Arrangements/Services Living arrangements for the past 2 months: [place] Lives with:: Self Patient language and need for interpreter reviewed:: Yes Do you feel safe going back to the place where you live?: Yes      Need for Family Participation in Patient Care: No (Comment) Care giver support system in place?: No (comment) Current home services: DME (wheelchair) Criminal Activity/Legal Involvement Pertinent to Current Situation/Hospitalization: No - Comment as needed  Activities of Daily Living Home Assistive Devices/Equipment: Wheelchair ADL Screening (condition at time of admission) Patient's cognitive ability adequate to safely complete daily activities?: Yes Is the patient deaf or have difficulty hearing?: No Does the patient have difficulty seeing, even when wearing glasses/contacts?: No Does the patient have difficulty concentrating, remembering, or making decisions?: No Patient able to express need for assistance with ADLs?: Yes Does the patient have difficulty dressing or bathing?: Yes Independently performs ADLs?: No Communication: Independent Dressing (OT): Needs assistance Is this a change from baseline?: Pre-admission baseline Grooming: Needs assistance Is this a change from baseline?: Pre-admission baseline Feeding: Independent Bathing: Needs assistance Is this a change from baseline?: Pre-admission baseline Toileting: Needs assistance Is this a change from baseline?: Pre-admission baseline In/Out Bed: Needs assistance Is this a change from baseline?: Pre-admission baseline Walks in Home: Dependent Is this a change from baseline?: Pre-admission baseline Does the patient have difficulty walking or climbing stairs?: Yes Weakness  of Legs: Both Weakness of Arms/Hands: None  Permission Sought/Granted                  Emotional Assessment Appearance:: Appears stated age Attitude/Demeanor/Rapport: Engaged Affect  (typically observed): Appropriate Orientation: : Oriented to Self, Oriented to  Time, Oriented to Place, Oriented to Situation Alcohol / Substance Use: Not Applicable Psych Involvement: No (comment)  Admission diagnosis:  Hypomagnesemia [E83.42] Hyperglycemia [R73.9] AKI (acute kidney injury) ([place]) [N17.9] Abdominal pain, unspecified abdominal location [R10.9] Hyperosmolar hyperglycemic state (HHS) ([place]) [E11.00] Hyperosmolar non-ketotic state due to type 2 diabetes mellitus ([place]) [E11.00] Patient Active Problem List   Diagnosis Date Noted   Hyperosmolar hyperglycemic state (HHS) ([place]) 05/11/2022   Hyperosmolar non-ketotic state due to type 2 diabetes mellitus ([place]) 05/11/2022   Leukocytosis 05/11/2022   Edema of left upper extremity 03/21/2022   Tachycardia 03/18/2022   Atypical chest pain 09/10/2021   Acute pyelonephritis    Acute kidney injury superimposed on chronic kidney disease ([place]) 08/16/2021   Acute on chronic kidney failure ([place]) 08/15/2021   PAF (paroxysmal atrial fibrillation) (HCC)    Prolonged QT interval    HFrEF (heart failure with reduced ejection fraction) ([place]) 02/21/2020   Chronic pain 09/04/2019   Malignant carcinoid tumor of duodenum ([place])    Nonalcoholic fatty liver disease 06/05/2019   Restrictive lung disease secondary to obesity    History of gastric ulcer    Fibroid uterus [id number]   Chronic systolic CHF (congestive heart failure) ([place]) 12/19/2018   Abdominal pain 07/17/2018   Anxiety 11/29/2017   Spinal stenosis, lumbar region with neurogenic claudication 08/03/2017   GERD (gastroesophageal reflux disease) 03/19/2017   Depression 03/19/2017   Morbid obesity ([place])    Normocytic anemia 08/16/2016   Diabetic gastroparesis (HCC) 08/16/2016   Gout 06/05/2016   Hypomagnesemia and hypokalemia 09/26/2015   Type 2 diabetes mellitus with hyperglycemia, with long-term current use of insulin ([place]) 05/25/2015   Chronic kidney disease (CKD), stage IV  (severe) ([place]) 05/25/2015   Essential hypertension, benign 09/28/2013   PCP:  Cena Benton, MD Pharmacy:   [place], [place] - [address] [place] [address] Phone: [phone] Fax: [phone]  [facility] [address] [place] [address] Phone: [phone] Fax: [phone]     Social Determinants of Health (SDOH) Social History: SDOH Screenings   Food Insecurity: No Food Insecurity (05/11/2022)  Housing: Low Risk  (05/11/2022)  Transportation Needs: No Transportation Needs (05/11/2022)  Utilities: Not At Risk (05/11/2022)  Depression (PHQ2-9): Medium Risk (09/16/2021)  Tobacco Use: Low Risk  (05/10/2022)   SDOH Interventions:     Readmission Risk Interventions     No data to display

## 2022-05-12 NOTE — Progress Notes (Addendum)
Deborah [name], is a 57 y.o. female, DOB - 1965/04/04, [id number] Admit date - 05/10/2022    Outpatient Primary MD for the patient is [name], [name], MD  LOS - 1  days    Brief summary   Patient is a 57 year old female with past medical history of morbid obesity, spinal stenosis uncontrolled diabetes mellitus type 2 with gastroparesis and stage IV chronic kidney disease, atrial fibrillation, hypertension and carcinoid tumor of the duodenum who presented to the emergency room on the evening of 3/4 with 1 week of abdominal pain, nausea and vomiting.  In the emergency room, patient found to have blood sugar of 540 and nonketotic hyperglycemia.  Patient started on IV fluids and insulin drip.  In addition, she reported to the admitting physician that she was having significant pain in her lower extremities which is now much worse even when she tried to pivot in and out of her wheelchair.  Patient was admitted to the hospitalist service.   Assessment & Plan    Assessment and Plan: * Hyperosmolar non-ketotic state due to type 2 diabetes mellitus ([place]) Uncontrolled with hyperglycemia.  CBG (last 3)  Recent Labs    05/11/22 2108 05/12/22 0554 05/12/22 1050  GLUCAP 333* 335* 221*   Started the patient on 45 units of semglee and SSI, added novolog 4 units TIDAC.  A!C IS 10.1.   Nausea and vomiting Appears to have resolved  Spinal stenosis, lumbar region with neurogenic claudication MRI of the Lumbar spine ordered and reviewed.  Requested neurosurgery consult.   Leukocytosis No source of infection so far. Procalcitonin 0.18, minimal and lactic acid within normal limits.  Probably reactive. WBC count around 13 this morning.  PAF (paroxysmal atrial fibrillation) (HCC) Rate controlled with metoprolol and on Eliquis for anticoagulation  Chronic kidney disease (CKD),  stage IV (severe) (HCC) Creatinine at baseline  Malignant carcinoid tumor of duodenum [facility]) Outpatient with oncology  Normocytic anemia Secondary to chronic renal disease.  Patient's baseline is around 7.5, currently stable.  She had an isolated erroneous lab on night of 3/4 at 9.2. Hemoglobin around 7 this morning,  recheck hemoglobin in am if still around 7, transfuse 1 unit of PRBC to keep it greater than 7. No obvious bleeding evident  Hypomagnesemia and hypokalemia Replaced.  Hyponatremia probably secondary to hyperglycemia Continue to monitor  GERD (gastroesophageal reflux disease) Continue PPI  Morbid obesity (HCC) Meets criteria BMI greater than 40         RN Pressure Injury Documentation:    Malnutrition Type:  Nutrition Problem: Inadequate oral intake Etiology: acute illness, decreased appetite   Malnutrition Characteristics:  Signs/Symptoms: per patient/family report   Nutrition Interventions:  Interventions: Boost Breeze, MVI  Estimated body mass index is 49.45 kg/m as calculated from the following:   Height as of this encounter: 5' 5.98" (1.676 m).   Weight as of this encounter: 138.9 kg.  Code Status: full code.  DVT Prophylaxis:   apixaban (ELIQUIS) tablet 5 mg   Level of Care: Level  of care: Telemetry Medical Family Communication: none at bedside.   Disposition Plan:     Remains inpatient appropriate:  hyperglycemia.   Procedures:  MRI lumbar spine.   Consultants:   None.   Antimicrobials:   Anti-infectives (From admission, onward)    None        Medications  Scheduled Meds:  (feeding supplement) PROSource Plus  30 mL Oral BID BM   apixaban  5 mg Oral BID   atorvastatin  10 mg Oral Daily   feeding supplement  1 Container Oral BID BM   insulin aspart  0-20 Units Subcutaneous TID WC   insulin aspart  0-5 Units Subcutaneous QHS   insulin aspart  3 Units Subcutaneous TID WC   insulin glargine-yfgn  45 Units  Subcutaneous Daily   loratadine  10 mg Oral Daily   metoprolol succinate  50 mg Oral Daily   multivitamin with minerals  1 tablet Oral Daily   pantoprazole  40 mg Oral BID   senna-docusate  1 tablet Oral Daily   sodium bicarbonate  650 mg Oral BID   Continuous Infusions:  lactated ringers Stopped (05/11/22 0253)   PRN Meds:.albuterol, dextrose, HYDROmorphone, polyethylene glycol    Subjective:   Darene Steimer was seen and examined today.  Back pain.   Objective:   Vitals:   05/12/22 0033 05/12/22 0510 05/12/22 0746 05/12/22 1102  BP: 121/69 109/69 117/71 107/75  Pulse: 97 90 85 88  Resp: '19 20 18 11  '$ Temp: 97.8 F (36.6 C) (!) 97.4 F (36.3 C) 97.8 F (36.6 C) (!) 97.5 F (36.4 C)  TempSrc: Oral Oral Oral Oral  SpO2: 95% 98% 94% 96%  Weight: (!) 138.9 kg     Height:        Intake/Output Summary (Last 24 hours) at 05/12/2022 1334 Last data filed at 05/12/2022 0040 Gross per 24 hour  Intake 120 ml  Output 500 ml  Net -380 ml   Filed Weights   05/10/22 1919 05/11/22 0501 05/12/22 0033  Weight: (!) 154.2 kg 133.8 kg (!) 138.9 kg     Exam General exam: Appears calm and comfortable  Respiratory system: Clear to auscultation. Respiratory effort normal. Cardiovascular system: S1 & S2 heard, RRR. No JVD, murmurs, Gastrointestinal system: Abdomen is nondistended, soft and nontender.  Central nervous system: Alert and oriented. No focal neurological deficits. Extremities: Symmetric 5 x 5 power. Skin: No rashes,  Psychiatry: Mood & affect appropriate.     Data Reviewed:  I have personally reviewed following labs and imaging studies   CBC Lab Results  Component Value Date   WBC 13.0 (H) 05/12/2022   RBC 2.42 (L) 05/12/2022   HGB 7.0 (L) 05/12/2022   HCT 22.0 (L) 05/12/2022   MCV 90.9 05/12/2022   MCH 28.9 05/12/2022   PLT 492 (H) 05/12/2022   MCHC 31.8 05/12/2022   RDW 13.7 05/12/2022   LYMPHSABS 2.2 05/12/2022   MONOABS 0.7 05/12/2022   EOSABS 0.3  05/12/2022   BASOSABS 0.1 [id number]     Last metabolic panel Lab Results  Component Value Date   NA 132 (L) 05/12/2022   K 3.9 05/12/2022   CL 102 05/12/2022   CO2 19 (L) 05/12/2022   BUN 69 (H) 05/12/2022   CREATININE 3.28 (H) 05/12/2022   GLUCOSE 265 (H) 05/12/2022   GFRNONAA 16 (L) 05/12/2022   GFRAA 49 (L) 11/30/2019   CALCIUM 9.3 05/12/2022   PHOS 2.7 05/11/2022   PROT 7.4 05/12/2022   ALBUMIN 2.4 (  L) 05/12/2022   BILITOT 0.5 05/12/2022   ALKPHOS 84 05/12/2022   AST 12 (L) 05/12/2022   ALT 8 05/12/2022   ANIONGAP 11 05/12/2022    CBG (last 3)  Recent Labs    05/11/22 2108 05/12/22 0554 05/12/22 1050  GLUCAP 333* 335* 221*      Coagulation Profile: No results for input(s): "INR", "PROTIME" in the last 168 hours.   Radiology Studies: MR LUMBAR SPINE WO CONTRAST  Result Date: 05/12/2022 CLINICAL DATA:  Low back pain, cauda equina syndrome suspected. Lower extremity weakness. EXAM: MRI LUMBAR SPINE WITHOUT CONTRAST TECHNIQUE: Multiplanar, multisequence MR imaging of the lumbar spine was performed. No intravenous contrast was administered. COMPARISON:  Lumbar spine MRI 03/19/2017. Lumbar spine CT 05/10/2022. FINDINGS: Segmentation: Conventional numbering is assumed with 5 non-rib-bearing, lumbar type vertebral bodies. Alignment: Trace retrolisthesis of L5 on S1. Grade 1 anterolisthesis of L4 on L5, unchanged. Vertebrae: Modic type 1 degenerative endplate marrow signal changes at T11-12. Conus medullaris and cauda equina: Conus extends to the L1-2 level. Conus and cauda equina appear normal. Paraspinal and other soft tissues: Unremarkable. Disc levels: Moderate degenerative changes of the bilateral sacroiliac joints. T12-L1:  Normal. L1-L2:  Normal. L2-L3:  Normal. L3-L4: Small disc bulge and mild bilateral facet arthropathy. No spinal canal stenosis or neural foraminal narrowing. L4-L5: Anterolisthesis with uncovered disc and bilateral facet arthropathy results in  moderate spinal canal stenosis and severe bilateral neural foraminal narrowing, worse from prior. Severe bilateral facet arthropathy with joint effusions and periarticular edema. L5-S1: Unchanged right eccentric disc bulge and facet arthropathy resulting in severe right and mild left neural foraminal narrowing. No spinal canal stenosis. IMPRESSION: 1. Slight progression of lower lumbar spondylosis since 2019, worst at L4-L5 where grade 1 anterolisthesis and contributes to moderate spinal canal stenosis and severe bilateral neural foraminal narrowing, worse from prior. 2. Severe bilateral L4-5 facet arthropathy with joint effusions and periarticular edema. This can be a cause of back pain. 3. Unchanged severe right neural foraminal narrowing at L5-S1. Electronically Signed   By: Emmit Alexanders M.D.   On: 05/12/2022 11:13   CT CHEST ABDOMEN PELVIS WO CONTRAST  Result Date: 05/10/2022 CLINICAL DATA:  Vomiting, weakness, and abdominal pain. EXAM: CT CHEST, ABDOMEN AND PELVIS WITHOUT CONTRAST TECHNIQUE: Multidetector CT imaging of the chest, abdomen and pelvis was performed following the standard protocol without IV contrast. RADIATION DOSE REDUCTION: This exam was performed according to the departmental dose-optimization program which includes automated exposure control, adjustment of the mA and/or kV according to patient size and/or use of iterative reconstruction technique. COMPARISON:  CT abdomen pelvis dated 03/17/2022. FINDINGS: Evaluation of this exam is limited in the absence of intravenous contrast as well as due to streak artifact caused by patient's arms. CT CHEST FINDINGS Cardiovascular: There is no cardiomegaly or pericardial effusion. Coronary vascular calcification of the LAD and RCA. Mild atherosclerotic calcification of the thoracic aorta. No aneurysmal dilatation. The central pulmonary arteries are grossly unremarkable. Mediastinum/Nodes: No hilar or mediastinal adenopathy. The esophagus is grossly  unremarkable. No mediastinal fluid collection. Lungs/Pleura: The lungs are clear. There is no pleural effusion or pneumothorax. The central airways are patent. Musculoskeletal: Degenerative changes of the spine. No acute osseous pathology. CT ABDOMEN PELVIS FINDINGS No intra-abdominal free air or free fluid. Hepatobiliary: The liver is grossly unremarkable but suboptimally visualized due to streak artifact caused by patient's arms. Cholecystectomy. Pancreas: Unremarkable. No pancreatic ductal dilatation or surrounding inflammatory changes. Spleen: Normal in size without focal abnormality. Adrenals/Urinary Tract: The adrenal  glands are unremarkable. There is no hydronephrosis or nephrolithiasis on either side. The visualized ureters and urinary bladder appear unremarkable. Stomach/Bowel: There is no bowel obstruction or active inflammation. The appendix is normal. Vascular/Lymphatic: Mild atherosclerotic calcification of the abdominal aorta. The IVC is unremarkable. No portal venous gas. There is no adenopathy. Reproductive: The uterus is anteverted. Similar appearance of a 4 cm left uterine fundus exophytic fibroid. Other: None Musculoskeletal: Degenerative changes of the spine. No acute osseous pathology. IMPRESSION: 1. No acute intrathoracic, abdominal, or pelvic pathology. 2. No bowel obstruction. Normal appendix. 3.  Aortic Atherosclerosis (ICD10-I70.0). Electronically Signed   By: Anner Crete M.D.   On: 05/10/2022 23:32   CT L-SPINE NO CHARGE  Result Date: 05/10/2022 CLINICAL DATA:  Right-sided pain and generalized weakness. EXAM: CT THORACIC AND LUMBAR SPINE WITHOUT CONTRAST TECHNIQUE: Multidetector CT imaging of the thoracic and lumbar spine was performed without contrast. Multiplanar CT image reconstructions were also generated. RADIATION DOSE REDUCTION: This exam was performed according to the departmental dose-optimization program which includes automated exposure control, adjustment of the mA  and/or kV according to patient size and/or use of iterative reconstruction technique. COMPARISON:  CTA chest abdomen pelvis dated 05/10/2022 and CT abdomen pelvis dated 03/17/2022. FINDINGS: CT THORACIC SPINE FINDINGS Alignment: No acute subluxation. Vertebrae: No acute fracture. Paraspinal and other soft tissues: Negative. Disc levels: No acute findings. Multilevel degenerative changes most prominent at T7-T8 with disc space narrowing, endplate irregularity and spurring/osteophyte. CT LUMBAR SPINE FINDINGS Segmentation: 5 lumbar type vertebrae. Alignment: No acute subluxation.  Grade 1 L4-L5 anterolisthesis. Vertebrae: No acute fracture. Paraspinal and other soft tissues: Negative. Disc levels: No acute findings. Degenerative changes primarily at L5-S1 with anterior osteophyte. IMPRESSION: 1. No acute/traumatic thoracic or lumbar spine pathology. 2. Degenerative changes. Electronically Signed   By: Anner Crete M.D.   On: 05/10/2022 23:25   CT T-SPINE NO CHARGE  Result Date: 05/10/2022 CLINICAL DATA:  Right-sided pain and generalized weakness. EXAM: CT THORACIC AND LUMBAR SPINE WITHOUT CONTRAST TECHNIQUE: Multidetector CT imaging of the thoracic and lumbar spine was performed without contrast. Multiplanar CT image reconstructions were also generated. RADIATION DOSE REDUCTION: This exam was performed according to the departmental dose-optimization program which includes automated exposure control, adjustment of the mA and/or kV according to patient size and/or use of iterative reconstruction technique. COMPARISON:  CTA chest abdomen pelvis dated 05/10/2022 and CT abdomen pelvis dated 03/17/2022. FINDINGS: CT THORACIC SPINE FINDINGS Alignment: No acute subluxation. Vertebrae: No acute fracture. Paraspinal and other soft tissues: Negative. Disc levels: No acute findings. Multilevel degenerative changes most prominent at T7-T8 with disc space narrowing, endplate irregularity and spurring/osteophyte. CT LUMBAR  SPINE FINDINGS Segmentation: 5 lumbar type vertebrae. Alignment: No acute subluxation.  Grade 1 L4-L5 anterolisthesis. Vertebrae: No acute fracture. Paraspinal and other soft tissues: Negative. Disc levels: No acute findings. Degenerative changes primarily at L5-S1 with anterior osteophyte. IMPRESSION: 1. No acute/traumatic thoracic or lumbar spine pathology. 2. Degenerative changes. Electronically Signed   By: Anner Crete M.D.   On: 05/10/2022 23:25   CT Head Wo Contrast  Result Date: 05/10/2022 CLINICAL DATA:  Weakness and pain, initial encounter EXAM: CT HEAD WITHOUT CONTRAST CT CERVICAL SPINE WITHOUT CONTRAST TECHNIQUE: Multidetector CT imaging of the head and cervical spine was performed following the standard protocol without intravenous contrast. Multiplanar CT image reconstructions of the cervical spine were also generated. RADIATION DOSE REDUCTION: This exam was performed according to the departmental dose-optimization program which includes automated exposure control, adjustment of the mA and/or  kV according to patient size and/or use of iterative reconstruction technique. COMPARISON:  None Available. FINDINGS: CT HEAD FINDINGS Brain: No evidence of acute infarction, hemorrhage, hydrocephalus, extra-axial collection or mass lesion/mass effect. Mild atrophic changes are noted. Vascular: No hyperdense vessel or unexpected calcification. Skull: Normal. Negative for fracture or focal lesion. Sinuses/Orbits: No acute finding. Other: None. CT CERVICAL SPINE FINDINGS Alignment: Mild straightening of the normal cervical lordosis is noted. Skull base and vertebrae: In cervical segments are well visualized. Vertebral body height is well maintained. No acute fracture or acute facet abnormality is noted. Mild osteophytic changes are noted worst at C5-6. Posterior fusion defect is noted at C1 posteriorly felt to be congenital in nature. The odontoid is within normal limits. Soft tissues and spinal canal:  Surrounding soft tissue structures are within normal limits. Upper chest: Visualized lung apices are within normal limits. Other: None IMPRESSION: CT of the head: No acute intracranial abnormality noted. Mild atrophic changes are noted. CT of the cervical spine: Mild degenerative change without acute abnormality. Electronically Signed   By: Inez Catalina M.D.   On: 05/10/2022 23:21   CT Cervical Spine Wo Contrast  Result Date: 05/10/2022 CLINICAL DATA:  Weakness and pain, initial encounter EXAM: CT HEAD WITHOUT CONTRAST CT CERVICAL SPINE WITHOUT CONTRAST TECHNIQUE: Multidetector CT imaging of the head and cervical spine was performed following the standard protocol without intravenous contrast. Multiplanar CT image reconstructions of the cervical spine were also generated. RADIATION DOSE REDUCTION: This exam was performed according to the departmental dose-optimization program which includes automated exposure control, adjustment of the mA and/or kV according to patient size and/or use of iterative reconstruction technique. COMPARISON:  None Available. FINDINGS: CT HEAD FINDINGS Brain: No evidence of acute infarction, hemorrhage, hydrocephalus, extra-axial collection or mass lesion/mass effect. Mild atrophic changes are noted. Vascular: No hyperdense vessel or unexpected calcification. Skull: Normal. Negative for fracture or focal lesion. Sinuses/Orbits: No acute finding. Other: None. CT CERVICAL SPINE FINDINGS Alignment: Mild straightening of the normal cervical lordosis is noted. Skull base and vertebrae: In cervical segments are well visualized. Vertebral body height is well maintained. No acute fracture or acute facet abnormality is noted. Mild osteophytic changes are noted worst at C5-6. Posterior fusion defect is noted at C1 posteriorly felt to be congenital in nature. The odontoid is within normal limits. Soft tissues and spinal canal: Surrounding soft tissue structures are within normal limits. Upper  chest: Visualized lung apices are within normal limits. Other: None IMPRESSION: CT of the head: No acute intracranial abnormality noted. Mild atrophic changes are noted. CT of the cervical spine: Mild degenerative change without acute abnormality. Electronically Signed   By: Inez Catalina M.D.   On: 05/10/2022 23:21       Hosie Poisson M.D. [facility] Hospitalist 05/12/2022, 1:34 PM  Available via Epic secure chat 7am-7pm After 7 pm, please refer to night coverage provider listed on amion.

## 2022-05-13 ENCOUNTER — Inpatient Hospital Stay (HOSPITAL_COMMUNITY): Payer: 59

## 2022-05-13 DIAGNOSIS — R112 Nausea with vomiting, unspecified: Secondary | ICD-10-CM | POA: Diagnosis not present

## 2022-05-13 DIAGNOSIS — M48062 Spinal stenosis, lumbar region with neurogenic claudication: Secondary | ICD-10-CM | POA: Diagnosis not present

## 2022-05-13 DIAGNOSIS — E1100 Type 2 diabetes mellitus with hyperosmolarity without nonketotic hyperglycemic-hyperosmolar coma (NKHHC): Secondary | ICD-10-CM | POA: Diagnosis not present

## 2022-05-13 DIAGNOSIS — I10 Essential (primary) hypertension: Secondary | ICD-10-CM | POA: Diagnosis not present

## 2022-05-13 LAB — CBC WITH DIFFERENTIAL/PLATELET
Abs Immature Granulocytes: 0.07 10*3/uL (ref 0.00–0.07)
Basophils Absolute: 0.1 10*3/uL (ref 0.0–0.1)
Basophils Relative: 0 %
Eosinophils Absolute: 0.3 10*3/uL (ref 0.0–0.5)
Eosinophils Relative: 2 %
HCT: 21.9 % — ABNORMAL LOW (ref 36.0–46.0)
Hemoglobin: 6.9 g/dL — CL (ref 12.0–15.0)
Immature Granulocytes: 1 %
Lymphocytes Relative: 17 %
Lymphs Abs: 2.3 10*3/uL (ref 0.7–4.0)
MCH: 28.6 pg (ref 26.0–34.0)
MCHC: 31.5 g/dL (ref 30.0–36.0)
MCV: 90.9 fL (ref 80.0–100.0)
Monocytes Absolute: 0.7 10*3/uL (ref 0.1–1.0)
Monocytes Relative: 5 %
Neutro Abs: 9.9 10*3/uL — ABNORMAL HIGH (ref 1.7–7.7)
Neutrophils Relative %: 75 %
Platelets: 469 10*3/uL — ABNORMAL HIGH (ref 150–400)
RBC: 2.41 MIL/uL — ABNORMAL LOW (ref 3.87–5.11)
RDW: 13.6 % (ref 11.5–15.5)
WBC: 13.2 10*3/uL — ABNORMAL HIGH (ref 4.0–10.5)
nRBC: 0 % (ref 0.0–0.2)

## 2022-05-13 LAB — IRON AND TIBC
Iron: 22 ug/dL — ABNORMAL LOW (ref 28–170)
Saturation Ratios: 9 % — ABNORMAL LOW (ref 10.4–31.8)
TIBC: 248 ug/dL — ABNORMAL LOW (ref 250–450)
UIBC: 226 ug/dL

## 2022-05-13 LAB — RETICULOCYTES
Immature Retic Fract: 18.6 % — ABNORMAL HIGH (ref 2.3–15.9)
RBC.: 2.32 MIL/uL — ABNORMAL LOW (ref 3.87–5.11)
Retic Count, Absolute: 65.2 10*3/uL (ref 19.0–186.0)
Retic Ct Pct: 2.8 % (ref 0.4–3.1)

## 2022-05-13 LAB — BASIC METABOLIC PANEL
Anion gap: 10 (ref 5–15)
BUN: 63 mg/dL — ABNORMAL HIGH (ref 6–20)
CO2: 19 mmol/L — ABNORMAL LOW (ref 22–32)
Calcium: 9.1 mg/dL (ref 8.9–10.3)
Chloride: 103 mmol/L (ref 98–111)
Creatinine, Ser: 3.04 mg/dL — ABNORMAL HIGH (ref 0.44–1.00)
GFR, Estimated: 17 mL/min — ABNORMAL LOW (ref >60)
Glucose, Bld: 402 mg/dL — ABNORMAL HIGH (ref 70–99)
Potassium: 3.7 mmol/L (ref 3.5–5.1)
Sodium: 132 mmol/L — ABNORMAL LOW (ref 135–145)

## 2022-05-13 LAB — HEMOGLOBIN AND HEMATOCRIT, BLOOD
HCT: 24.6 % — ABNORMAL LOW (ref 36.0–46.0)
Hemoglobin: 7.6 g/dL — ABNORMAL LOW (ref 12.0–15.0)

## 2022-05-13 LAB — GLUCOSE, CAPILLARY
Glucose-Capillary: 394 mg/dL — ABNORMAL HIGH (ref 70–99)
Glucose-Capillary: 349 mg/dL — ABNORMAL HIGH (ref 70–99)
Glucose-Capillary: 341 mg/dL — ABNORMAL HIGH (ref 70–99)
Glucose-Capillary: 370 mg/dL — ABNORMAL HIGH (ref 70–99)

## 2022-05-13 LAB — VITAMIN B12: Vitamin B-12: 289 pg/mL (ref 180–914)

## 2022-05-13 LAB — FERRITIN: Ferritin: 495 ng/mL — ABNORMAL HIGH (ref 11–307)

## 2022-05-13 LAB — PREPARE RBC (CROSSMATCH)

## 2022-05-13 LAB — FOLATE: Folate: 9.4 ng/mL (ref >5.9)

## 2022-05-13 MED ORDER — SODIUM CHLORIDE 0.9% IV SOLUTION: Freq: Once | INTRAVENOUS | Status: AC

## 2022-05-13 MED ORDER — INSULIN ASPART 100 UNIT/ML IJ SOLN
5.0000 [IU] | Freq: Three times a day (TID) | INTRAMUSCULAR | Status: DC
  Administered 2022-05-13: 5 [IU] via SUBCUTANEOUS

## 2022-05-13 MED ORDER — INSULIN ASPART 100 UNIT/ML IJ SOLN
10.0000 [IU] | Freq: Three times a day (TID) | INTRAMUSCULAR | Status: DC
  Administered 2022-05-13: 10 [IU] via SUBCUTANEOUS

## 2022-05-13 MED ORDER — DIAZEPAM 5 MG/ML IJ SOLN: 5.0000 mg | Freq: Once | INTRAMUSCULAR | Status: DC | PRN

## 2022-05-13 MED ORDER — MELATONIN 3 MG PO TABS
3.0000 mg | ORAL_TABLET | Freq: Every evening | ORAL | Status: DC | PRN
  Administered 2022-05-13 – 2022-05-25 (×6): 3 mg via ORAL
  Filled 2022-05-13 (×7): qty 1

## 2022-05-13 MED ORDER — INSULIN GLARGINE-YFGN 100 UNIT/ML ~~LOC~~ SOLN
50.0000 [IU] | Freq: Every day | SUBCUTANEOUS | Status: DC
  Administered 2022-05-13 – 2022-05-15 (×3): 50 [IU] via SUBCUTANEOUS
  Filled 2022-05-13 (×3): qty 0.5

## 2022-05-13 NOTE — Progress Notes (Signed)
Physical Therapy Treatment Patient Details Name: Deborah [name] MRN: [id number] DOB: 1965/03/15 Today's Date: 05/13/2022   History of Present Illness 57 y.o. female with medical history significant of DM2, CKD IV, A.fib    Gastric ulcer, carcinoid tumor of the duodenum, systolic CHF EF [id number], HTN    Admitted for   HHS, leg pain possible sciatica    PT Comments    Pt had fair tolerance to treatment today. Co treat with OT. Pt was able to complete 2 sit to stands today with +2 Mod A however declined further mobility due to pain and fear of falling despite being provided reassurance by PT and OT that she was not going to fall. Pt continues to function at supervision level for bed mobility. Given that pt needs to be independent with transfers and lives alone, continuing to recommend SNF. Pt continues to refuse SNF however pt was educated on current need for assistance and possibility of HHPT pending progress. PT will continue to follow. Pt may benefit from learning slideboard transfers if there is a plateau in progress.  Recommendations for follow up therapy are one component of a multi-disciplinary discharge planning process, led by the attending physician.  Recommendations may be updated based on patient status, additional functional criteria and insurance authorization.  Follow Up Recommendations  Skilled nursing-short term rehab (<3 hours/day) (However could progress to HHPT if pt progresses) Can patient physically be transported by private vehicle: No   Assistance Recommended at Discharge Frequent or constant Supervision/Assistance  Patient can return home with the following A lot of help with walking and/or transfers;A lot of help with bathing/dressing/bathroom;Assistance with cooking/housework;Assist for transportation;Help with stairs or ramp for entrance   Equipment Recommendations  Other (comment) (per accepting facility)    Recommendations for Other Services       Precautions /  Restrictions Precautions Precautions: Fall Restrictions Weight Bearing Restrictions: No     Mobility  Bed Mobility Overal bed mobility: Needs Assistance Bed Mobility: Supine to Sit, Sit to Supine Rolling: Supervision   Supine to sit: Supervision, HOB elevated Sit to supine: Supervision, HOB elevated   General bed mobility comments: Pt needs increased time with bed mobility due to pain. Prefers HOB elevated and occasionally uses rails to assist with bed mobility    Transfers Overall transfer level: Needs assistance Equipment used: Rolling walker (2 wheels) Transfers: Sit to/from Stand Sit to Stand: Mod assist, +2 physical assistance, From elevated surface           General transfer comment: Pt performed 2 sit>stands, exhibited fear of falling. Which OT/PT addressed with reassurance of safety. Pt declined further mobility due to fear of being stuck in chair.    Ambulation/Gait                   Stairs             Wheelchair Mobility    Modified Rankin (Stroke Patients Only)       Balance Overall balance assessment: Needs assistance Sitting-balance support: Bilateral upper extremity supported, Feet supported Sitting balance-Leahy Scale: Normal       Standing balance-Leahy Scale: Poor Standing balance comment: Mod A x2 for safety due to fear of falling + RW                            Cognition Arousal/Alertness: Awake/alert Behavior During Therapy: WFL for tasks assessed/performed Overall Cognitive Status: Within Functional Limits for tasks assessed  Exercises Other Exercises Other Exercises: Marching in place EOB BLEs 2x5 reps with rest breaks Other Exercises: Isometric horizontal shoulder abduction BUEs x 10 reps each.    General Comments General comments (skin integrity, edema, etc.): VSS on RA. HR up to 121 during transfer.      Pertinent Vitals/Pain Pain  Assessment Pain Assessment: 0-10 Pain Score: 10-Worst pain ever Pain Location: Pt reports pain all over, Pt asked nurse for pain meds at end of OT session Pain Descriptors / Indicators: Constant, Shooting Pain Intervention(s): Monitored during session, Patient requesting pain meds-RN notified    Home Living                          Prior Function            PT Goals (current goals can now be found in the care plan section) Progress towards PT goals: Progressing toward goals    Frequency    Min 3X/week      PT Plan Current plan remains appropriate    Co-evaluation PT/OT/SLP Co-Evaluation/Treatment: Yes Reason for Co-Treatment: For patient/therapist safety PT goals addressed during session: Mobility/safety with mobility;Proper use of DME OT goals addressed during session: ADL's and self-care      AM-PAC PT "6 Clicks" Mobility   Outcome Measure  Help needed turning from your back to your side while in a flat bed without using bedrails?: A Little Help needed moving from lying on your back to sitting on the side of a flat bed without using bedrails?: A Little Help needed moving to and from a bed to a chair (including a wheelchair)?: Total Help needed standing up from a chair using your arms (e.g., wheelchair or bedside chair)?: A Lot Help needed to walk in hospital room?: Total Help needed climbing 3-5 steps with a railing? : Total 6 Click Score: 11    End of Session Equipment Utilized During Treatment: Gait belt Activity Tolerance: Patient limited by pain Patient left: in bed;with call bell/phone within reach;with nursing/sitter in room Nurse Communication: Mobility status PT Visit Diagnosis: Muscle weakness (generalized) (M62.81);Pain Pain - Right/Left: Left Pain - part of body: Shoulder     Time: [id number] PT Time Calculation (min) (ACUTE ONLY): 40 min  Charges:  $Therapeutic Activity: 23-37 mins                     Shelby Mattocks, PT, DPT Acute Rehab  Services [id number]    Viann Shove 05/13/2022, 2:30 PM

## 2022-05-13 NOTE — Progress Notes (Signed)
Deborah [name], is a 57 y.o. female, DOB - [birth year], [id number] Admit date - 05/10/2022    Outpatient Primary MD for the patient is [name], [name], MD  LOS - 2  days    Brief summary   Patient is a 57 year old female with past medical history of morbid obesity, spinal stenosis uncontrolled diabetes mellitus type 2 with gastroparesis and stage IV chronic kidney disease, atrial fibrillation, hypertension and carcinoid tumor of the duodenum who presented to the emergency room on the evening of 3/4 with 1 week of abdominal pain, nausea and vomiting.  In the emergency room, patient found to have blood sugar of 540 and nonketotic hyperglycemia.  Patient started on IV fluids and insulin drip.  In addition, she reported to the admitting physician that she was having significant pain in her lower extremities which is now much worse even when she tried to pivot in and out of her wheelchair.  Patient was admitted to the hospitalist service.   Assessment & Plan    Assessment and Plan: * Hyperosmolar non-ketotic state due to type 2 diabetes mellitus ([place]) Uncontrolled with hyperglycemia.  CBG (last 3)  Recent Labs    05/12/22 2110 05/13/22 0613 05/13/22 1118  GLUCAP 275* 394* 349*    Increased semglee to 50 units daily, and increased novolog to 10 units TIDAC. IN addition to the SSI.  A!C IS 10.1.   Nausea and vomiting Appears to have resolved  Spinal stenosis, lumbar region with neurogenic claudication MRI of the Lumbar spine ordered and reviewed.  Requested neurosurgery consult, Dr Ronnald Ramp recommended outpatient follow up on discharge, and no surgical intervention needed at this time.   Leukocytosis No source of infection so far. Procalcitonin 0.18, minimal and lactic acid within normal limits.  Probably reactive. WBC count around 13.2 this morning.  PAF  (paroxysmal atrial fibrillation) (HCC) Rate controlled with metoprolol and on Eliquis for anticoagulation  Chronic kidney disease (CKD), stage IV (severe) (HCC) Creatinine at baseline around 3.   Malignant carcinoid tumor of duodenum [facility]) Outpatient with oncology  Normocytic anemia Secondary to chronic renal disease.  Patient's baseline is around 7.5,.  She had an isolated erroneous lab on night of 3/4 at 9.2. Hemoglobin around 6.9 this morning, 1 unit of prbc transfusion ordered. Repeat H&H tonight and get anemia panel and stool for occult blood.  No obvious bleeding evident  Hypomagnesemia and hypokalemia Replaced.  Hyponatremia probably secondary to hyperglycemia Continue to monitor  GERD (gastroesophageal reflux disease) Continue PPI  Morbid obesity (HCC) Body mass index is 49.56 kg/m. Recommend outpatient follow up with PCp for weight loss.    Left shoulder pain and unable to lift left arm; X rays suspicious for rotator cuff tear.  MRI of the left left shoulder ordered.    Right knee pain and right leg pain:  X rays show severe Severe tricompartmental degenerative changes, most prominent in the lateral compartment. Physical therapy and pain control.    Therapy eval recommending SNF.  Toc on board.  RN Pressure Injury Documentation:    Malnutrition Type:  Nutrition Problem: Inadequate oral intake Etiology: acute illness, decreased appetite   Malnutrition Characteristics:  Signs/Symptoms: per patient/family report   Nutrition Interventions:  Interventions: Boost Breeze, MVI  Estimated body mass index is 49.56 kg/m as calculated from the following:   Height as of this encounter: 5' 5.98" (1.676 m).   Weight as of this encounter: 139.2 kg.  Code Status: full code.  DVT Prophylaxis:   apixaban (ELIQUIS) tablet 5 mg   Level of Care: Level of care: Telemetry Medical Family Communication: none at bedside.   Disposition Plan:     Remains  inpatient appropriate:  hyperglycemia.   Procedures:  MRI lumbar spine.   Consultants:   None.   Antimicrobials:   Anti-infectives (From admission, onward)    None        Medications  Scheduled Meds:  (feeding supplement) PROSource Plus  30 mL Oral BID BM   apixaban  5 mg Oral BID   atorvastatin  10 mg Oral Daily   feeding supplement  1 Container Oral BID BM   insulin aspart  0-20 Units Subcutaneous TID WC   insulin aspart  0-5 Units Subcutaneous QHS   insulin aspart  5 Units Subcutaneous TID WC   insulin glargine-yfgn  50 Units Subcutaneous Daily   loratadine  10 mg Oral Daily   metoprolol succinate  50 mg Oral Daily   multivitamin with minerals  1 tablet Oral Daily   pantoprazole  40 mg Oral BID   senna-docusate  1 tablet Oral Daily   sodium bicarbonate  650 mg Oral BID   Continuous Infusions:  lactated ringers Stopped (05/11/22 0253)   PRN Meds:.albuterol, dextrose, HYDROmorphone (DILAUDID) injection, HYDROmorphone, polyethylene glycol    Subjective:   Belissa Tannous was seen and examined today.  Pain in the right leg improving.   Objective:   Vitals:   05/12/22 2328 05/13/22 0335 05/13/22 0749 05/13/22 1251  BP: 92/62 (!) 103/51 (!) 114/50 (!) 131/54  Pulse: 96 99 100 100  Resp: '18 16 17 20  '$ Temp: 98 F (36.7 C) 98 F (36.7 C) 98.2 F (36.8 C) 98.3 F (36.8 C)  TempSrc: Oral Oral Oral Oral  SpO2: 92% 97% 98% 100%  Weight:  (!) 139.2 kg    Height:        Intake/Output Summary (Last 24 hours) at 05/13/2022 1311 Last data filed at 05/13/2022 0800 Gross per 24 hour  Intake 960 ml  Output 600 ml  Net 360 ml    Filed Weights   05/11/22 0501 05/12/22 0033 05/13/22 0335  Weight: 133.8 kg (!) 138.9 kg (!) 139.2 kg     Exam General exam: Appears calm and comfortable  Respiratory system: Clear to auscultation. Respiratory effort normal. Cardiovascular system: S1 & S2 heard, RRR. No JVD, murmurs, rubs, gallops or clicks. No pedal  edema. Gastrointestinal system: Abdomen is nondistended, soft and nontender.  Central nervous system: Alert and oriented. No focal neurological deficits. Extremities: right leg pain and swelling, left shoulder pain and unable to raise left arm.  Skin: No rashes, lesions or ulcers Psychiatry: Judgement and insight appear normal. Mood & affect appropriate.      Data Reviewed:  I have personally reviewed following labs and imaging studies   CBC Lab Results  Component Value Date   WBC 13.2 (H) 05/13/2022   RBC 2.41 (L) 05/13/2022   HGB 6.9 (LL) 05/13/2022   HCT 21.9 (L) 05/13/2022   MCV 90.9  05/13/2022   MCH 28.6 05/13/2022   PLT 469 (H) 05/13/2022   MCHC 31.5 05/13/2022   RDW 13.6 05/13/2022   LYMPHSABS 2.3 05/13/2022   MONOABS 0.7 05/13/2022   EOSABS 0.3 05/13/2022   BASOSABS 0.1 [id number]     Last metabolic panel Lab Results  Component Value Date   NA 132 (L) 05/13/2022   K 3.7 05/13/2022   CL 103 05/13/2022   CO2 19 (L) 05/13/2022   BUN 63 (H) 05/13/2022   CREATININE 3.04 (H) 05/13/2022   GLUCOSE 402 (H) 05/13/2022   GFRNONAA 17 (L) 05/13/2022   GFRAA 49 (L) 11/30/2019   CALCIUM 9.1 05/13/2022   PHOS 2.7 05/11/2022   PROT 7.4 05/12/2022   ALBUMIN 2.4 (L) 05/12/2022   BILITOT 0.5 05/12/2022   ALKPHOS 84 05/12/2022   AST 12 (L) 05/12/2022   ALT 8 05/12/2022   ANIONGAP 10 05/13/2022    CBG (last 3)  Recent Labs    05/12/22 2110 05/13/22 0613 05/13/22 1118  GLUCAP 275* 394* 349*       Coagulation Profile: No results for input(s): "INR", "PROTIME" in the last 168 hours.   Radiology Studies: DG Shoulder Left  Result Date: 05/13/2022 CLINICAL DATA:  Severe left shoulder pain.  No injury. EXAM: LEFT SHOULDER - 2+ VIEW COMPARISON:  None Available. FINDINGS: No acute fracture or dislocation. High-riding humeral head. Mild acromioclavicular and glenohumeral degenerative changes. Soft tissues are unremarkable. IMPRESSION: 1. No acute osseous abnormality.  2. High-riding humeral head suggestive of underlying rotator cuff tear. Electronically Signed   By: Titus Dubin M.D.   On: 05/13/2022 09:24   DG Knee 1-2 Views Right  Result Date: 05/12/2022 CLINICAL DATA:  Knee pain EXAM: RIGHT KNEE - 1-2 VIEW COMPARISON:  None Available. FINDINGS: No fracture or dislocation is seen. Severe tricompartmental degenerative changes, most prominent in the lateral compartment. The visualized soft tissues are unremarkable. No suprapatellar knee joint effusion. IMPRESSION: No fracture or dislocation is seen. Severe tricompartmental degenerative changes, most prominent in the lateral compartment. Electronically Signed   By: Julian Hy M.D.   On: 05/12/2022 17:33   DG Foot Complete Right  Result Date: 05/12/2022 CLINICAL DATA:  Right leg pain EXAM: RIGHT FOOT COMPLETE - 3+ VIEW COMPARISON:  None Available. FINDINGS: No fracture or dislocation is seen. Mild tibiotalar degenerative changes. Mild degenerative changes of the dorsal midfoot. The visualized soft tissues are unremarkable. IMPRESSION: No fracture or dislocation is seen. Mild degenerative changes, as above. Electronically Signed   By: Julian Hy M.D.   On: 05/12/2022 17:32   DG Tibia/Fibula Right  Result Date: 05/12/2022 CLINICAL DATA:  Right leg pain EXAM: RIGHT TIBIA AND FIBULA - 2 VIEW COMPARISON:  None Available. FINDINGS: No fracture or dislocation is seen. Mild tibiotalar degenerative changes. Visualized soft tissues are within normal limits. IMPRESSION: Negative. Electronically Signed   By: Julian Hy M.D.   On: 05/12/2022 17:32   MR LUMBAR SPINE WO CONTRAST  Result Date: 05/12/2022 CLINICAL DATA:  Low back pain, cauda equina syndrome suspected. Lower extremity weakness. EXAM: MRI LUMBAR SPINE WITHOUT CONTRAST TECHNIQUE: Multiplanar, multisequence MR imaging of the lumbar spine was performed. No intravenous contrast was administered. COMPARISON:  Lumbar spine MRI 03/19/2017. Lumbar spine CT  05/10/2022. FINDINGS: Segmentation: Conventional numbering is assumed with 5 non-rib-bearing, lumbar type vertebral bodies. Alignment: Trace retrolisthesis of L5 on S1. Grade 1 anterolisthesis of L4 on L5, unchanged. Vertebrae: Modic type 1 degenerative endplate marrow signal changes at T11-12. Conus medullaris and cauda  equina: Conus extends to the L1-2 level. Conus and cauda equina appear normal. Paraspinal and other soft tissues: Unremarkable. Disc levels: Moderate degenerative changes of the bilateral sacroiliac joints. T12-L1:  Normal. L1-L2:  Normal. L2-L3:  Normal. L3-L4: Small disc bulge and mild bilateral facet arthropathy. No spinal canal stenosis or neural foraminal narrowing. L4-L5: Anterolisthesis with uncovered disc and bilateral facet arthropathy results in moderate spinal canal stenosis and severe bilateral neural foraminal narrowing, worse from prior. Severe bilateral facet arthropathy with joint effusions and periarticular edema. L5-S1: Unchanged right eccentric disc bulge and facet arthropathy resulting in severe right and mild left neural foraminal narrowing. No spinal canal stenosis. IMPRESSION: 1. Slight progression of lower lumbar spondylosis since 2019, worst at L4-L5 where grade 1 anterolisthesis and contributes to moderate spinal canal stenosis and severe bilateral neural foraminal narrowing, worse from prior. 2. Severe bilateral L4-5 facet arthropathy with joint effusions and periarticular edema. This can be a cause of back pain. 3. Unchanged severe right neural foraminal narrowing at L5-S1. Electronically Signed   By: Emmit Alexanders M.D.   On: 05/12/2022 11:13       Hosie Poisson M.D. [facility]ist 05/13/2022, 1:11 PM  Available via Epic secure chat 7am-7pm After 7 pm, please refer to night coverage provider listed on amion.

## 2022-05-13 NOTE — Progress Notes (Signed)
[facility] T. Opyd text via chat that patient is requesting IV Valium for sedation for MRI and that procedure to be done tomorrow. MRI was called and informed of patient of patient request.

## 2022-05-13 NOTE — TOC Progression Note (Addendum)
Transition of Care [facility]) - Progression Note    Patient Details  Name: Deborah [name] MRN: [id number] Date of Birth: April 09, 1965  [facility]) CM/SW Contact  Zenon Mayo, RN Phone Number: 05/13/2022, 2:00 PM  Clinical Narrative:    NCM spoke with patient at bedside, ask how she will get to her MD apts, she states her aide usually takes her, but she did not need much ast at that time, but now she is plus two ast.  Concerned about how much patient will be able to do at home her self,  pt/ot eval rec SNF but she does not want to go to SNF with fear of losing her section 8 housing.   Asked if we could have referral for the [employer] program for her.   [employer] will need to be notified when she is discharged so they can deliver the Bariatric Drop arm BSC to her home when she is there.   Expected Discharge Plan: [place] Barriers to Discharge: Continued Medical Work up  Expected Discharge Plan and Services In-house Referral: NA Discharge Planning Services: CM Consult Post Acute Care Choice: [place] arrangements for the past 2 months: Single Family Home                 DME Arranged: Bedside commode DME Agency: [employer] Date DME Agency Contacted: 05/12/22 Time DME Agency Contacted: [phone] Representative spoke with at DME Agency: Erasmo Downer HH Arranged: RN, Disease Management, PT, Social Work [employer] Agency: [place] Date [place]: 05/12/22 Time [place]: [place] Representative spoke with at [place]: [place] Determinants of Health ([place]) Interventions [place]: No Food Insecurity (05/11/2022)  Housing: Low Risk  (05/11/2022)  Transportation Needs: No Transportation Needs (05/11/2022)  Utilities: Not At Risk (05/11/2022)  Depression (PHQ2-9): Medium Risk (09/16/2021)  Tobacco Use: Low Risk  (05/10/2022)    Readmission Risk Interventions     No data to display

## 2022-05-13 NOTE — Progress Notes (Signed)
Occupational Therapy Treatment Patient Details Name: Deborah [name] MRN: [id number] DOB: December 03, 1965 Today's Date: 05/13/2022   History of present illness 57 y.o. female with medical history significant of DM2, CKD IV, A.fib    Gastric ulcer, carcinoid tumor of the duodenum, systolic CHF EF [id number], HTN    Admitted for   HHS, leg pain possible sciatica   OT comments  Pt making good progress toward patient focused goals. Pt completed 2 sit to stands with +2 Mod A, exhibited fear of falling. Functioning at supervision for bed mobility with use of rails and HOB elevated, remains limited in activities due to pain. Pt will continue to benefit from continued skilled OT services to improve activity tolerance, strength, and functional mobility. Due to patient not progressing as anticipated and fear of falling, OT recommending SNF for follow-up OT services.   Recommendations for follow up therapy are one component of a multi-disciplinary discharge planning process, led by the attending physician.  Recommendations may be updated based on patient status, additional functional criteria and insurance authorization.    Follow Up Recommendations  Skilled nursing-short term rehab (<3 hours/day)     Assistance Recommended at Discharge Frequent or constant Supervision/Assistance  Patient can return home with the following  Assist for transportation;Assistance with cooking/housework;A lot of help with walking and/or transfers;A lot of help with bathing/dressing/bathroom   Equipment Recommendations  BSC/3in1    Recommendations for Other Services      Precautions / Restrictions Precautions Precautions: Fall Restrictions Weight Bearing Restrictions: No       Mobility Bed Mobility Overal bed mobility: Needs Assistance   Rolling: Supervision   Supine to sit: Supervision, HOB elevated Sit to supine: Supervision, HOB elevated   General bed mobility comments: Pt needs increased time with bed mobility  due to pain. Prefers HOB elevated and occasionally uses rails to assist with bed mobility    Transfers Overall transfer level: Needs assistance Equipment used: Rolling walker (2 wheels) Transfers: Sit to/from Stand Sit to Stand: Mod assist, +2 physical assistance, From elevated surface           General transfer comment: Pt performed 2 sit>stands, exhibited fear of falling. Which OT/PT addressed with reassurance of safety.     Balance Overall balance assessment: Needs assistance Sitting-balance support: Bilateral upper extremity supported, Feet supported Sitting balance-Leahy Scale: Normal       Standing balance-Leahy Scale: Poor Standing balance comment: Mod A x2 for safety due to fear of falling + RW                           ADL either performed or assessed with clinical judgement   ADL Overall ADL's : Needs assistance/impaired                                     Functional mobility during ADLs: Moderate assistance;Rolling walker (2 wheels);+2 for physical assistance General ADL Comments: Sit>stand x2 Mod Ax2    Extremity/Trunk Assessment Upper Extremity Assessment Upper Extremity Assessment: LUE deficits/detail LUE Deficits / Details: Pt reports increase L shoulder pain in the past couple of days. Pt guarding L shoulder during exercises with decreased AROM present. No visible signs of LUE impairments LUE: Shoulder pain at rest            Vision Baseline Vision/History: 0 No visual deficits Ability to See in Adequate Light: 0 Adequate Patient  Visual Report: No change from baseline     Perception Perception Perception: Not tested   Praxis Praxis Praxis: Not tested    Cognition Arousal/Alertness: Awake/alert Behavior During Therapy: WFL for tasks assessed/performed Overall Cognitive Status: Within Functional Limits for tasks assessed                                          Exercises Other Exercises Other  Exercises: Marching in place EOB BLEs 2x5 reps with rest breaks Other Exercises: Isometric horizontal shoulder abduction BUEs x 10 reps each.    Shoulder Instructions       General Comments      Pertinent Vitals/ Pain       Pain Assessment Pain Assessment: 0-10 Pain Score: 10-Worst pain ever Pain Location: Pt reports pain all over, Pt asked nurse for pain meds at end of OT session Pain Descriptors / Indicators: Constant, Shooting  Home Living                                          Prior Functioning/Environment              Frequency  Min 2X/week        Progress Toward Goals  OT Goals(current goals can now be found in the care plan section)  Progress towards OT goals: Progressing toward goals  Acute Rehab OT Goals Patient Stated Goal: Return home OT Goal Formulation: With patient Time For Goal Achievement: 05/25/22 Potential to Achieve Goals: Good  Plan Frequency remains appropriate    Co-evaluation    PT/OT/SLP Co-Evaluation/Treatment: Yes Reason for Co-Treatment: For patient/therapist safety   OT goals addressed during session: ADL's and self-care      AM-PAC OT "6 Clicks" Daily Activity     Outcome Measure   Help from another person eating meals?: A Little Help from another person taking care of personal grooming?: A Little Help from another person toileting, which includes using toliet, bedpan, or urinal?: A Lot Help from another person bathing (including washing, rinsing, drying)?: A Lot Help from another person to put on and taking off regular upper body clothing?: A Little Help from another person to put on and taking off regular lower body clothing?: A Lot 6 Click Score: 15    End of Session Equipment Utilized During Treatment: Gait belt;Rolling walker (2 wheels)  OT Visit Diagnosis: Unsteadiness on feet (R26.81);Pain Pain - Right/Left: Left Pain - part of body: Shoulder;Arm;Hand (Pt reports pain all over body)    Activity Tolerance Patient limited by pain;Patient tolerated treatment well   Patient Left in bed;with call bell/phone within reach;with nursing/sitter in room   Nurse Communication Mobility status        Time: [id number] OT Time Calculation (min): 40 min  Charges: OT General Charges $OT Visit: 1 Visit OT Treatments $Therapeutic Activity: 8-22 mins  05/13/2022  RP, OTR/L  Acute Rehabilitation Services  Office:  [phone]   Metta Clines 05/13/2022, 10:18 AM

## 2022-05-13 NOTE — Care Management Important Message (Signed)
Important Message  Patient Details  Name: LINDZY MOOTS MRN: [id number] Date of Birth: 22-Mar-1965   Medicare Important Message Given:  Yes     Shelda Altes 05/13/2022, 8:45 AM

## 2022-05-14 ENCOUNTER — Inpatient Hospital Stay (HOSPITAL_COMMUNITY): Payer: 59

## 2022-05-14 DIAGNOSIS — M7989 Other specified soft tissue disorders: Secondary | ICD-10-CM | POA: Diagnosis not present

## 2022-05-14 DIAGNOSIS — R52 Pain, unspecified: Secondary | ICD-10-CM

## 2022-05-14 DIAGNOSIS — M48062 Spinal stenosis, lumbar region with neurogenic claudication: Secondary | ICD-10-CM | POA: Diagnosis not present

## 2022-05-14 DIAGNOSIS — E1100 Type 2 diabetes mellitus with hyperosmolarity without nonketotic hyperglycemic-hyperosmolar coma (NKHHC): Secondary | ICD-10-CM | POA: Diagnosis not present

## 2022-05-14 DIAGNOSIS — R112 Nausea with vomiting, unspecified: Secondary | ICD-10-CM | POA: Diagnosis not present

## 2022-05-14 DIAGNOSIS — I10 Essential (primary) hypertension: Secondary | ICD-10-CM | POA: Diagnosis not present

## 2022-05-14 LAB — TYPE AND SCREEN
ABO/RH(D): O POS
Antibody Screen: NEGATIVE
Donor AG Type: NEGATIVE
Donor AG Type: NEGATIVE
PT AG Type: NEGATIVE
Unit division: 0
Unit division: 0

## 2022-05-14 LAB — CBC WITH DIFFERENTIAL/PLATELET
Abs Immature Granulocytes: 0.18 10*3/uL — ABNORMAL HIGH (ref 0.00–0.07)
Basophils Absolute: 0.1 10*3/uL (ref 0.0–0.1)
Basophils Relative: 0 %
Eosinophils Absolute: 0.3 10*3/uL (ref 0.0–0.5)
Eosinophils Relative: 2 %
HCT: 26.6 % — ABNORMAL LOW (ref 36.0–46.0)
Hemoglobin: 8.3 g/dL — ABNORMAL LOW (ref 12.0–15.0)
Immature Granulocytes: 1 %
Lymphocytes Relative: 10 %
Lymphs Abs: 1.8 10*3/uL (ref 0.7–4.0)
MCH: 28.3 pg (ref 26.0–34.0)
MCHC: 31.2 g/dL (ref 30.0–36.0)
MCV: 90.8 fL (ref 80.0–100.0)
Monocytes Absolute: 1.1 10*3/uL — ABNORMAL HIGH (ref 0.1–1.0)
Monocytes Relative: 6 %
Neutro Abs: 15.1 10*3/uL — ABNORMAL HIGH (ref 1.7–7.7)
Neutrophils Relative %: 81 %
Platelets: 425 10*3/uL — ABNORMAL HIGH (ref 150–400)
RBC: 2.93 MIL/uL — ABNORMAL LOW (ref 3.87–5.11)
RDW: 14.5 % (ref 11.5–15.5)
WBC: 18.6 10*3/uL — ABNORMAL HIGH (ref 4.0–10.5)
nRBC: 0 % (ref 0.0–0.2)

## 2022-05-14 LAB — BASIC METABOLIC PANEL
Anion gap: 13 (ref 5–15)
BUN: 70 mg/dL — ABNORMAL HIGH (ref 6–20)
CO2: 15 mmol/L — ABNORMAL LOW (ref 22–32)
Calcium: 9.2 mg/dL (ref 8.9–10.3)
Chloride: 101 mmol/L (ref 98–111)
Creatinine, Ser: 3.35 mg/dL — ABNORMAL HIGH (ref 0.44–1.00)
GFR, Estimated: 15 mL/min — ABNORMAL LOW (ref >60)
Glucose, Bld: 321 mg/dL — ABNORMAL HIGH (ref 70–99)
Potassium: 4.4 mmol/L (ref 3.5–5.1)
Sodium: 129 mmol/L — ABNORMAL LOW (ref 135–145)

## 2022-05-14 LAB — GLUCOSE, CAPILLARY
Glucose-Capillary: 222 mg/dL — ABNORMAL HIGH (ref 70–99)
Glucose-Capillary: 224 mg/dL — ABNORMAL HIGH (ref 70–99)
Glucose-Capillary: 278 mg/dL — ABNORMAL HIGH (ref 70–99)
Glucose-Capillary: 369 mg/dL — ABNORMAL HIGH (ref 70–99)
Glucose-Capillary: 378 mg/dL — ABNORMAL HIGH (ref 70–99)

## 2022-05-14 LAB — BPAM RBC
Blood Product Expiration Date: 202404052359
Blood Product Expiration Date: 202404052359
Unit Type and Rh: 5100
Unit Type and Rh: 5100

## 2022-05-14 LAB — PROCALCITONIN: Procalcitonin: 0.12 ng/mL

## 2022-05-14 MED ORDER — HYDROMORPHONE HCL 1 MG/ML IJ SOLN
1.0000 mg | Freq: Once | INTRAMUSCULAR | Status: AC
  Administered 2022-05-14: 1 mg via INTRAVENOUS
  Filled 2022-05-14: qty 1

## 2022-05-14 MED ORDER — INSULIN ASPART 100 UNIT/ML IJ SOLN
15.0000 [IU] | Freq: Three times a day (TID) | INTRAMUSCULAR | Status: DC
  Administered 2022-05-14 – 2022-05-15 (×4): 15 [IU] via SUBCUTANEOUS

## 2022-05-14 NOTE — Plan of Care (Signed)
  Problem: Education: Goal: Knowledge of General Education information will improve Description: Including pain rating scale, medication(s)/side effects and non-pharmacologic comfort measures Outcome: Progressing   Problem: Health Behavior/Discharge Planning: Goal: Ability to manage health-related needs will improve Outcome: Progressing   Problem: Clinical Measurements: Goal: Ability to maintain clinical measurements within normal limits will improve Outcome: Progressing Goal: Will remain free from infection Outcome: Progressing Goal: Diagnostic test results will improve Outcome: Progressing Goal: Respiratory complications will improve Outcome: Progressing Goal: Cardiovascular complication will be avoided Outcome: Progressing   Problem: Activity: Goal: Risk for activity intolerance will decrease Outcome: Progressing   Problem: Nutrition: Goal: Adequate nutrition will be maintained Outcome: Progressing   Problem: Coping: Goal: Level of anxiety will decrease Outcome: Progressing   Problem: Elimination: Goal: Will not experience complications related to bowel motility Outcome: Progressing Goal: Will not experience complications related to urinary retention Outcome: Progressing   Problem: Pain Managment: Goal: General experience of comfort will improve Outcome: Progressing   Problem: Safety: Goal: Ability to remain free from injury will improve Outcome: Progressing   Problem: Skin Integrity: Goal: Risk for impaired skin integrity will decrease Outcome: Progressing   Problem: Education: Goal: Ability to describe self-care measures that may prevent or decrease complications (Diabetes Survival Skills Education) will improve Outcome: Progressing Goal: Individualized Educational Video(s) Outcome: Progressing   

## 2022-05-14 NOTE — Progress Notes (Addendum)
Deborah [name], is a 57 y.o. female, DOB - October 06, 1965, [id number] Admit date - 05/10/2022    Outpatient Primary MD for the patient is [name], [name], MD  LOS - 3  days    Brief summary   Patient is a 57 year old female with past medical history of morbid obesity, spinal stenosis uncontrolled diabetes mellitus type 2 with gastroparesis and stage IV chronic kidney disease, atrial fibrillation, hypertension and carcinoid tumor of the duodenum who presented to the emergency room on the evening of 3/4 with 1 week of abdominal pain, nausea and vomiting.  In the emergency room, patient found to have blood sugar of 540 and nonketotic hyperglycemia.  Patient started on IV fluids and insulin drip.  In addition, she reported to the admitting physician that she was having significant pain in her lower extremities which is now much worse even when she tried to pivot in and out of her wheelchair.  Patient was admitted to the hospitalist service.   Assessment & Plan    Assessment and Plan: * Hyperosmolar non-ketotic state due to type 2 diabetes mellitus ([place]) Uncontrolled with hyperglycemia.  CBG (last 3)  Recent Labs    05/14/22 0650 05/14/22 0754 05/14/22 1114  GLUCAP 222* 224* 278*    Increased semglee to 50 units daily, and increased novolog to 15 units TIDAC. IN addition to the SSI.  A!C IS 10.1.   Nausea and vomiting Appears to have resolved  Spinal stenosis, lumbar region with neurogenic claudication MRI of the Lumbar spine ordered and reviewed.  Requested neurosurgery consult, Dr Ronnald Ramp recommended outpatient follow up on discharge, and no surgical intervention needed at this time.   Leukocytosis No source of infection so far. Procalcitonin 0.18, minimal and lactic acid within normal limits.  Probably reactive. WBC count around 13.2 this morning.  PAF  (paroxysmal atrial fibrillation) (HCC) Rate controlled with metoprolol and on Eliquis for anticoagulation  Chronic kidney disease (CKD), stage IV (severe) (HCC) Creatinine at baseline around 3.   Malignant carcinoid tumor of duodenum [facility]) Outpatient with oncology  Normocytic anemia Secondary to chronic renal disease.  Patient's baseline is around 7.5,.  She had an isolated erroneous lab on night of 3/4 at 9.2. Hemoglobin around 6.9 this morning, 1 unit of prbc transfusion ordered. Repeat H&H improved to 8.3. and get anemia panel and stool for occult blood. Anemia panel shows low iron. Supplementation will be added ond ischarge.  No obvious bleeding evident  Hypomagnesemia and hypokalemia Replaced. Repeat levels wnl.   Hyponatremia probably secondary to hyperglycemia Continue to monitor  GERD (gastroesophageal reflux disease) Continue PPI  Morbid obesity (HCC) Body mass index is 49.56 kg/m. Recommend outpatient follow up with PCp for weight loss.    Left shoulder pain and unable to lift left arm; X rays suspicious for rotator cuff tear.  MRI of the left left shoulder ordered.    Right knee pain and right leg pain:  X rays show severe Severe tricompartmental degenerative changes, most prominent in the lateral compartment. Physical therapy  and pain control.  Venous duplex ordered for persistent pain    Leukocytosis:  ? Reactive. Pro calcitonin is 0.12    Therapy eval recommending SNF.  Toc on board.       RN Pressure Injury Documentation:    Malnutrition Type:  Nutrition Problem: Inadequate oral intake Etiology: acute illness, decreased appetite   Malnutrition Characteristics:  Signs/Symptoms: per patient/family report   Nutrition Interventions:  Interventions: Boost Breeze, MVI  Estimated body mass index is 49.56 kg/m as calculated from the following:   Height as of this encounter: 5' 5.98" (1.676 m).   Weight as of this encounter: 139.2  kg.  Code Status: full code.  DVT Prophylaxis:   apixaban (ELIQUIS) tablet 5 mg   Level of Care: Level of care: Med-Surg Family Communication: none at bedside.   Disposition Plan:     Remains inpatient appropriate:  hyperglycemia.   Procedures:  MRI lumbar spine.   Consultants:   None.   Antimicrobials:   Anti-infectives (From admission, onward)    None        Medications  Scheduled Meds:  (feeding supplement) PROSource Plus  30 mL Oral BID BM   apixaban  5 mg Oral BID   atorvastatin  10 mg Oral Daily   feeding supplement  1 Container Oral BID BM   insulin aspart  0-20 Units Subcutaneous TID WC   insulin aspart  0-5 Units Subcutaneous QHS   insulin aspart  10 Units Subcutaneous TID WC   insulin glargine-yfgn  50 Units Subcutaneous Daily   loratadine  10 mg Oral Daily   metoprolol succinate  50 mg Oral Daily   multivitamin with minerals  1 tablet Oral Daily   pantoprazole  40 mg Oral BID   senna-docusate  1 tablet Oral Daily   sodium bicarbonate  650 mg Oral BID   Continuous Infusions:  lactated ringers Stopped (05/11/22 0253)   PRN Meds:.albuterol, dextrose, diazepam, HYDROmorphone (DILAUDID) injection, HYDROmorphone, melatonin, polyethylene glycol    Subjective:   Deborah [name] was seen and examined today.  Reports having a panic attack this morning, refused the MRI 2 times.   Objective:   Vitals:   05/13/22 1701 05/13/22 1950 05/14/22 0353 05/14/22 0756  BP: 131/76 109/61 120/75 120/84  Pulse: 94 88 92 (!) 105  Resp:  '17 17 16  '$ Temp: 98.8 F (37.1 C) 98.1 F (36.7 C) 98.6 F (37 C) (!) 97.5 F (36.4 C)  TempSrc: Oral Oral  Oral  SpO2: 100% 100% 100% 100%  Weight:      Height:        Intake/Output Summary (Last 24 hours) at 05/14/2022 1524 Last data filed at 05/14/2022 0930 Gross per 24 hour  Intake 596.67 ml  Output 100 ml  Net 496.67 ml    Filed Weights   05/11/22 0501 05/12/22 0033 05/13/22 0335  Weight: 133.8 kg (!) 138.9 kg  (!) 139.2 kg     Exam General exam: Appears calm and comfortable  Respiratory system: Clear to auscultation. Respiratory effort normal. Cardiovascular system: S1 & S2 heard, RRR. No JVD,  Gastrointestinal system: Abdomen is nondistended, soft and nontender.  Central nervous system: Alert and oriented. No focal neurological deficits. Extremities: right lower extremity swelling.  Skin: No rashes, Psychiatry:  Mood & affect appropriate.       Data Reviewed:  I have personally reviewed following labs and imaging studies   CBC Lab Results  Component Value Date   WBC 18.6 (H) 05/14/2022   RBC  2.93 (L) 05/14/2022   HGB 8.3 (L) 05/14/2022   HCT 26.6 (L) 05/14/2022   MCV 90.8 05/14/2022   MCH 28.3 05/14/2022   PLT 425 (H) 05/14/2022   MCHC 31.2 05/14/2022   RDW 14.5 05/14/2022   LYMPHSABS 1.8 05/14/2022   MONOABS 1.1 (H) 05/14/2022   EOSABS 0.3 05/14/2022   BASOSABS 0.1 [id number]     Last metabolic panel Lab Results  Component Value Date   NA 129 (L) 05/14/2022   K 4.4 05/14/2022   CL 101 05/14/2022   CO2 15 (L) 05/14/2022   BUN 70 (H) 05/14/2022   CREATININE 3.35 (H) 05/14/2022   GLUCOSE 321 (H) 05/14/2022   GFRNONAA 15 (L) 05/14/2022   GFRAA 49 (L) 11/30/2019   CALCIUM 9.2 05/14/2022   PHOS 2.7 05/11/2022   PROT 7.4 05/12/2022   ALBUMIN 2.4 (L) 05/12/2022   BILITOT 0.5 05/12/2022   ALKPHOS 84 05/12/2022   AST 12 (L) 05/12/2022   ALT 8 05/12/2022   ANIONGAP 13 05/14/2022    CBG (last 3)  Recent Labs    05/14/22 0650 05/14/22 0754 05/14/22 1114  GLUCAP 222* 224* 278*       Coagulation Profile: No results for input(s): "INR", "PROTIME" in the last 168 hours.   Radiology Studies: DG Shoulder Left  Result Date: 05/13/2022 CLINICAL DATA:  Severe left shoulder pain.  No injury. EXAM: LEFT SHOULDER - 2+ VIEW COMPARISON:  None Available. FINDINGS: No acute fracture or dislocation. High-riding humeral head. Mild acromioclavicular and glenohumeral  degenerative changes. Soft tissues are unremarkable. IMPRESSION: 1. No acute osseous abnormality. 2. High-riding humeral head suggestive of underlying rotator cuff tear. Electronically Signed   By: Titus Dubin M.D.   On: 05/13/2022 09:24   DG Knee 1-2 Views Right  Result Date: 05/12/2022 CLINICAL DATA:  Knee pain EXAM: RIGHT KNEE - 1-2 VIEW COMPARISON:  None Available. FINDINGS: No fracture or dislocation is seen. Severe tricompartmental degenerative changes, most prominent in the lateral compartment. The visualized soft tissues are unremarkable. No suprapatellar knee joint effusion. IMPRESSION: No fracture or dislocation is seen. Severe tricompartmental degenerative changes, most prominent in the lateral compartment. Electronically Signed   By: Julian Hy M.D.   On: 05/12/2022 17:33   DG Foot Complete Right  Result Date: 05/12/2022 CLINICAL DATA:  Right leg pain EXAM: RIGHT FOOT COMPLETE - 3+ VIEW COMPARISON:  None Available. FINDINGS: No fracture or dislocation is seen. Mild tibiotalar degenerative changes. Mild degenerative changes of the dorsal midfoot. The visualized soft tissues are unremarkable. IMPRESSION: No fracture or dislocation is seen. Mild degenerative changes, as above. Electronically Signed   By: Julian Hy M.D.   On: 05/12/2022 17:32   DG Tibia/Fibula Right  Result Date: 05/12/2022 CLINICAL DATA:  Right leg pain EXAM: RIGHT TIBIA AND FIBULA - 2 VIEW COMPARISON:  None Available. FINDINGS: No fracture or dislocation is seen. Mild tibiotalar degenerative changes. Visualized soft tissues are within normal limits. IMPRESSION: Negative. Electronically Signed   By: Julian Hy M.D.   On: 05/12/2022 17:32       Hosie Poisson M.D. Deborah Hospitalist 05/14/2022, 3:24 PM  Available via Epic secure chat 7am-7pm After 7 pm, please refer to night coverage provider listed on amion.

## 2022-05-14 NOTE — TOC Progression Note (Signed)
Transition of Care [facility]) - Progression Note    Patient Details  Name: Deborah [name] MRN: [id number] Date of Birth: [birth year]  Transition of Care [facility]) CM/SW [place], RN Phone Number: 05/14/2022, 1:41 PM  Clinical Narrative:    CM met with the patient at the bedside and discuss SNF placement for STR services with the patient.  The patient has available Medicare coverage.  I explained to the patient if she is admitted to the [place] facility and is able to discharge to home within the 20 days, that her Medicaid insurance and disability check are not utilized.  The patient was agreeable to SNF placement and discussion regarding bed offers as long as the facility does not take her Medicaid check for expenses.  I updated Drema Dallas, MSW and she will discuss bed offers with the patient when available - otherwise patient will need to progress with Inpatient PT and return home with home health services when able to safely mobilize and return home alone with [facility] Services.  The patient has PCS Services active with [facility] through Medicaid at home 6 days a week for 3 hours per day.  SNF workup started by MSW and will follow up with possible bed offers.   Expected Discharge Plan: [place] Barriers to Discharge: Continued Medical Work up  Expected Discharge Plan and Services In-house Referral: NA Discharge Planning Services: CM Consult Post Acute Care Choice: [place] Living arrangements for the past 2 months: Single Family Home                 DME Arranged: Bedside commode DME Agency: [employer] Date DME Agency Contacted: 05/12/22 Time DME Agency Contacted: [phone] Representative spoke with at DME Agency: Erasmo Downer HH Arranged: RN, Disease Management, PT, Social Work [employer] Agency: [place] Date [place]: 05/12/22 Time [place]: [place] Representative spoke with at [place]: [place]  Determinants of Health ([place]) Interventions [place]: No Food Insecurity (05/11/2022)  Housing: Low Risk  (05/11/2022)  Transportation Needs: No Transportation Needs (05/11/2022)  Utilities: Not At Risk (05/11/2022)  Depression (PHQ2-9): Medium Risk (09/16/2021)  Tobacco Use: Low Risk  (05/10/2022)    Readmission Risk Interventions     No data to display

## 2022-05-14 NOTE — Progress Notes (Signed)
Patient removed he gown and requested to leave the curtain and door open explained that she's exposed that visitors or patients walking up the hall can see her. She stated that she is having a anxiety attack she wanted the temp lowered in room she was given 0.5 of dilaudid for hip pain.

## 2022-05-14 NOTE — Progress Notes (Signed)
PT Cancellation Note  Patient Details Name: Deborah [name] MRN: [id number] DOB: [birth year]   Cancelled Treatment:    Reason Eval/Treat Not Completed: Pain limiting ability to participate - pt states "I can't, my whole body hurts, I just had a panic attack", pt unwilling to participate in PT at this time. PT to check back as able.   Stacie Glaze, PT DPT Acute Rehabilitation Services Pager [phone]  Office [phone]    Louis Matte 05/14/2022, 11:44 AM

## 2022-05-14 NOTE — Progress Notes (Signed)
Current SNF bed offers provided to pt at bedside. Pt to discuss offers with her family and update SW on SNF choice vs return home with [facility] and continued PCS services. SW will provide updates as available.   Wandra Feinstein, MSW, LCSW [phone] (coverage)

## 2022-05-14 NOTE — TOC Progression Note (Signed)
Transition of Care [facility]) - Progression Note    Patient Details  Name: Deborah [name] MRN: [id number] Date of Birth: 01/08/1966  Transition of Care [facility]) CM/SW [place], RN Phone Number: 05/14/2022, 1:25 PM  Clinical Narrative:    CM spoke with the patient at the bedside and explained SNF placement for short term placement.  The patient states that she lives in section 8 housing and would consider SNF placement only if she is admitted for short term placement and her Medicaid / Medicaid check is not utilized - since she needs her funds to cover her apartment and living expenses.  SNF work up will be started and bed offers presented to the patient when available.  CM will continue to follow the patient for SNF placement - otherwise patient plans to return home with home health services as set up with [facility].   Expected Discharge Plan: [place] Barriers to Discharge: Continued Medical Work up  Expected Discharge Plan and Services In-house Referral: NA Discharge Planning Services: CM Consult Post Acute Care Choice: [place] Living arrangements for the past 2 months: Single Family Home                 DME Arranged: Bedside commode DME Agency: [employer] Date DME Agency Contacted: 05/12/22 Time DME Agency Contacted: [phone] Representative spoke with at DME Agency: Erasmo Downer HH Arranged: RN, Disease Management, PT, Social Work [employer] Agency: [place] Date [place]: 05/12/22 Time [place]: [place] Representative spoke with at [place]: [place] Determinants of Health ([place]) Interventions [place]: No Food Insecurity (05/11/2022)  Housing: Low Risk  (05/11/2022)  Transportation Needs: No Transportation Needs (05/11/2022)  Utilities: Not At Risk (05/11/2022)  Depression (PHQ2-9): Medium Risk (09/16/2021)  Tobacco Use: Low Risk  (05/10/2022)    Readmission Risk  Interventions     No data to display

## 2022-05-14 NOTE — Inpatient Diabetes Management (Signed)
Inpatient Diabetes Program Recommendations  AACE/ADA: New Consensus Statement on Inpatient Glycemic Control (2015)  Target Ranges:  Prepandial:   less than 140 mg/dL      Peak postprandial:   less than 180 mg/dL (1-2 hours)      Critically ill patients:  140 - 180 mg/dL    Latest Reference Range & Units 05/13/22 06:13 05/13/22 11:18 05/13/22 17:15 05/13/22 21:00  Glucose-Capillary 70 - 99 mg/dL 394 (H)  20 units Novolog  349 (H)  20 units Novolog  50 units Semglee '@1039'$  341 (H)  25 units Novolog  370 (H)  5 units Novolog   (H): Data is abnormally high  Latest Reference Range & Units 05/14/22 07:54 05/14/22 11:14  Glucose-Capillary 70 - 99 mg/dL 224 (H)  7 units Novolog  278 (H)  11 units Novolog  50 units Semglee '@0930'$   (H): Data is abnormally high      Home DM Meds: Lantus 50 units QHS     Humalog 7-30 units tid with meals    Current Orders: Semglee 50 units Daily      Novolog Resistant Correction Scale/ SSI (0-20 units) TID AC + HS      Novolog 10 units TID with meals    MD- Note CBGs remain >200.  Pt did not get Novolog Meal Coverage at 8am nor 12pm today b/c pt refused to eat meals  May consider increasing the Semglee insulin further to 55 units Daily     --Will follow patient during hospitalization--  Wyn Quaker RN, MSN, [place] Diabetes Coordinator Inpatient Glycemic Control Team Team Pager: [phone] (8a-5p)

## 2022-05-14 NOTE — NC FL2 (Signed)
[place] LEVEL OF CARE FORM     IDENTIFICATION  Patient Name: Deborah [name] Birthdate: 1965/10/30 Sex: female Admission Date (Current Location): 05/10/2022  [facility] and [place] Number:  Herbalist and Address:  [facility] [place], [place] [address] [place], [place] [address]      Provider Number: [id number]  Attending Physician Name and Address:  Hosie Poisson, MD  Relative Name and Phone Number:       Current Level of Care: Hospital Recommended Level of Care: [place] Prior Approval Number:    Date Approved/Denied:   PASRR Number: [id number]  Discharge Plan: SNF    Current Diagnoses: Patient Active Problem List   Diagnosis Date Noted   Hyperosmolar hyperglycemic state (HHS) ([place]) 05/11/2022   Hyperosmolar non-ketotic state due to type 2 diabetes mellitus ([place]) 05/11/2022   Leukocytosis 05/11/2022   Edema of left upper extremity 03/21/2022   Tachycardia 03/18/2022   Atypical chest pain 09/10/2021   Acute pyelonephritis    Acute kidney injury superimposed on chronic kidney disease ([place]) 08/16/2021   Acute on chronic kidney failure ([place]) 08/15/2021   PAF (paroxysmal atrial fibrillation) (HCC)    Prolonged QT interval    HFrEF (heart failure with reduced ejection fraction) ([place]) 02/21/2020   Chronic pain 09/04/2019   Malignant carcinoid tumor of duodenum ([place])    Nonalcoholic fatty liver disease 06/05/2019   Restrictive lung disease secondary to obesity    History of gastric ulcer    Fibroid uterus [id number]   Chronic systolic CHF (congestive heart failure) ([place]) 12/19/2018   Abdominal pain 07/17/2018   Anxiety 11/29/2017   Spinal stenosis, lumbar region with neurogenic claudication 08/03/2017   GERD (gastroesophageal reflux disease) 03/19/2017   Depression 03/19/2017   Morbid obesity ([place])    Normocytic anemia 08/16/2016   Diabetic gastroparesis ([place]) 08/16/2016   Gout 06/05/2016   Hypomagnesemia  and hypokalemia 09/26/2015   Type 2 diabetes mellitus with hyperglycemia, with long-term current use of insulin ([place]) 05/25/2015   Chronic kidney disease (CKD), stage IV (severe) ([place]) 05/25/2015   Essential hypertension, benign 09/28/2013    Orientation RESPIRATION BLADDER Height & Weight     Self, Time, Situation, Place  Normal Continent, External catheter Weight: (!) 306 lb 14.1 oz (139.2 kg) Height:  5' 5.98" (167.6 cm)  BEHAVIORAL SYMPTOMS/MOOD NEUROLOGICAL BOWEL NUTRITION STATUS      Continent    AMBULATORY STATUS COMMUNICATION OF NEEDS Skin   Extensive Assist Verbally Normal                       Personal Care Assistance Level of Assistance  Bathing, Feeding, Dressing Bathing Assistance: Maximum assistance Feeding assistance: Limited assistance Dressing Assistance: Maximum assistance     Functional Limitations Info  Sight, Hearing, Speech Sight Info: Adequate Hearing Info: Adequate Speech Info: Adequate    SPECIAL CARE FACTORS FREQUENCY  PT (By licensed PT), OT (By licensed OT)                    Contractures Contractures Info: Not present    Additional Factors Info                  Current Medications (05/14/2022):  This is the current hospital active medication list Current Facility-Administered Medications  Medication Dose Route Frequency Provider Last Rate Last Admin   (feeding supplement) PROSource Plus liquid 30 mL  30 mL Oral BID BM Annita Brod, MD  albuterol (PROVENTIL) (2.5 MG/3ML) 0.083% nebulizer solution 2.5 mg  2.5 mg Nebulization Q6H PRN Toy Baker, MD       apixaban (ELIQUIS) tablet 5 mg  5 mg Oral BID Toy Baker, MD   5 mg at 05/14/22 0931   atorvastatin (LIPITOR) tablet 10 mg  10 mg Oral Daily Doutova, Anastassia, MD   10 mg at 05/14/22 0930   dextrose 50 % solution 0-50 mL  0-50 mL Intravenous PRN Doutova, Anastassia, MD       diazepam (VALIUM) injection 5 mg  5 mg Intravenous Once PRN Opyd, Ilene Qua, MD       feeding supplement (BOOST / RESOURCE BREEZE) liquid 1 Container  1 Container Oral BID BM Annita Brod, MD       HYDROmorphone (DILAUDID) injection 0.5 mg  0.5 mg Intravenous Q4H PRN Hosie Poisson, MD   0.5 mg at 05/14/22 1351   HYDROmorphone (DILAUDID) tablet 4 mg  4 mg Oral Q6H PRN Doutova, Anastassia, MD   4 mg at 05/14/22 1159   insulin aspart (novoLOG) injection 0-20 Units  0-20 Units Subcutaneous TID WC Annita Brod, MD   11 Units at 05/14/22 1156   insulin aspart (novoLOG) injection 0-5 Units  0-5 Units Subcutaneous QHS Annita Brod, MD   5 Units at 05/13/22 2217   insulin aspart (novoLOG) injection 10 Units  10 Units Subcutaneous TID WC Hosie Poisson, MD   10 Units at 05/13/22 1758   insulin glargine-yfgn (SEMGLEE) injection 50 Units  50 Units Subcutaneous Daily Hosie Poisson, MD   50 Units at 05/14/22 0930   lactated ringers infusion   Intravenous Continuous Toy Baker, MD   Stopped at 05/11/22 0253   loratadine (CLARITIN) tablet 10 mg  10 mg Oral Daily Doutova, Nyoka Lint, MD   10 mg at 05/14/22 0933   melatonin tablet 3 mg  3 mg Oral QHS PRN Opyd, Ilene Qua, MD   3 mg at 05/13/22 2324   metoprolol succinate (TOPROL-XL) 24 hr tablet 50 mg  50 mg Oral Daily Annita Brod, MD   50 mg at 05/14/22 0931   multivitamin with minerals tablet 1 tablet  1 tablet Oral Daily Annita Brod, MD   1 tablet at 05/14/22 0931   pantoprazole (PROTONIX) EC tablet 40 mg  40 mg Oral BID Toy Baker, MD   40 mg at 05/14/22 0930   polyethylene glycol (MIRALAX / GLYCOLAX) packet 17 g  17 g Oral Daily PRN Doutova, Nyoka Lint, MD       senna-docusate (Senokot-S) tablet 1 tablet  1 tablet Oral Daily Doutova, Anastassia, MD   1 tablet at 05/12/22 0935   sodium bicarbonate tablet 650 mg  650 mg Oral BID Toy Baker, MD   650 mg at 05/14/22 [id number]     Discharge Medications: Please see discharge summary for a list of discharge medications.  Relevant  Imaging Results:  Relevant Lab Results:   Additional Information SS# [id number]  [name], [place]

## 2022-05-15 DIAGNOSIS — R112 Nausea with vomiting, unspecified: Secondary | ICD-10-CM | POA: Diagnosis not present

## 2022-05-15 DIAGNOSIS — E1100 Type 2 diabetes mellitus with hyperosmolarity without nonketotic hyperglycemic-hyperosmolar coma (NKHHC): Secondary | ICD-10-CM | POA: Diagnosis not present

## 2022-05-15 DIAGNOSIS — I10 Essential (primary) hypertension: Secondary | ICD-10-CM | POA: Diagnosis not present

## 2022-05-15 DIAGNOSIS — M48062 Spinal stenosis, lumbar region with neurogenic claudication: Secondary | ICD-10-CM | POA: Diagnosis not present

## 2022-05-15 LAB — CBC WITH DIFFERENTIAL/PLATELET
Abs Immature Granulocytes: 0.11 10*3/uL — ABNORMAL HIGH (ref 0.00–0.07)
Basophils Absolute: 0.1 10*3/uL (ref 0.0–0.1)
Basophils Relative: 0 %
Eosinophils Absolute: 0.4 10*3/uL (ref 0.0–0.5)
Eosinophils Relative: 3 %
HCT: 24.2 % — ABNORMAL LOW (ref 36.0–46.0)
Hemoglobin: 7.6 g/dL — ABNORMAL LOW (ref 12.0–15.0)
Immature Granulocytes: 1 %
Lymphocytes Relative: 12 %
Lymphs Abs: 1.7 10*3/uL (ref 0.7–4.0)
MCH: 28.3 pg (ref 26.0–34.0)
MCHC: 31.4 g/dL (ref 30.0–36.0)
MCV: 90 fL (ref 80.0–100.0)
Monocytes Absolute: 1.3 10*3/uL — ABNORMAL HIGH (ref 0.1–1.0)
Monocytes Relative: 9 %
Neutro Abs: 11.4 10*3/uL — ABNORMAL HIGH (ref 1.7–7.7)
Neutrophils Relative %: 75 %
Platelets: 492 10*3/uL — ABNORMAL HIGH (ref 150–400)
RBC: 2.69 MIL/uL — ABNORMAL LOW (ref 3.87–5.11)
RDW: 14.3 % (ref 11.5–15.5)
WBC: 15 10*3/uL — ABNORMAL HIGH (ref 4.0–10.5)
nRBC: 0 % (ref 0.0–0.2)

## 2022-05-15 LAB — BASIC METABOLIC PANEL
Anion gap: 11 (ref 5–15)
BUN: 70 mg/dL — ABNORMAL HIGH (ref 6–20)
CO2: 21 mmol/L — ABNORMAL LOW (ref 22–32)
Calcium: 9.3 mg/dL (ref 8.9–10.3)
Chloride: 99 mmol/L (ref 98–111)
Creatinine, Ser: 3.06 mg/dL — ABNORMAL HIGH (ref 0.44–1.00)
GFR, Estimated: 17 mL/min — ABNORMAL LOW (ref >60)
Glucose, Bld: 250 mg/dL — ABNORMAL HIGH (ref 70–99)
Potassium: 4 mmol/L (ref 3.5–5.1)
Sodium: 131 mmol/L — ABNORMAL LOW (ref 135–145)

## 2022-05-15 LAB — GLUCOSE, CAPILLARY
Glucose-Capillary: 302 mg/dL — ABNORMAL HIGH (ref 70–99)
Glucose-Capillary: 377 mg/dL — ABNORMAL HIGH (ref 70–99)
Glucose-Capillary: 261 mg/dL — ABNORMAL HIGH (ref 70–99)
Glucose-Capillary: 245 mg/dL — ABNORMAL HIGH (ref 70–99)

## 2022-05-15 MED ORDER — INSULIN ASPART 100 UNIT/ML IJ SOLN
20.0000 [IU] | Freq: Three times a day (TID) | INTRAMUSCULAR | Status: DC
  Administered 2022-05-16 (×2): 20 [IU] via SUBCUTANEOUS

## 2022-05-15 MED ORDER — HYDROMORPHONE HCL 1 MG/ML IJ SOLN
0.5000 mg | Freq: Four times a day (QID) | INTRAMUSCULAR | Status: DC | PRN
  Administered 2022-05-15 – 2022-05-17 (×6): 0.5 mg via INTRAVENOUS
  Filled 2022-05-15 (×6): qty 0.5

## 2022-05-15 MED ORDER — INSULIN GLARGINE-YFGN 100 UNIT/ML ~~LOC~~ SOLN
60.0000 [IU] | Freq: Every day | SUBCUTANEOUS | Status: DC
  Administered 2022-05-16 – 2022-05-17 (×2): 60 [IU] via SUBCUTANEOUS
  Filled 2022-05-15 (×2): qty 0.6

## 2022-05-15 NOTE — Progress Notes (Signed)
On call contacted overnight for one time pain medication - pt yelling, crying, stating severe pain. See eMar for PRN pain medication - of note, last IV dilaudid admin pt afterwards rolled eyes upwards stating "ohhh yeahhh"

## 2022-05-15 NOTE — Plan of Care (Signed)
  Problem: Education: Goal: Knowledge of General Education information will improve Description: Including pain rating scale, medication(s)/side effects and non-pharmacologic comfort measures Outcome: Progressing   Problem: Clinical Measurements: Goal: Ability to maintain clinical measurements within normal limits will improve Outcome: Progressing   Problem: Activity: Goal: Risk for activity intolerance will decrease Outcome: Progressing   Problem: Nutrition: Goal: Adequate nutrition will be maintained Outcome: Progressing   Problem: Coping: Goal: Level of anxiety will decrease Outcome: Progressing   Problem: Pain Managment: Goal: General experience of comfort will improve Outcome: Progressing   Problem: Skin Integrity: Goal: Risk for impaired skin integrity will decrease Outcome: Progressing   Problem: Health Behavior/Discharge Planning: Goal: Ability to identify and utilize available resources and services will improve Outcome: Progressing Goal: Ability to manage health-related needs will improve Outcome: Progressing   Problem: Metabolic: Goal: Ability to maintain appropriate glucose levels will improve Outcome: Progressing   Problem: Nutritional: Goal: Maintenance of adequate nutrition will improve Outcome: Progressing Goal: Progress toward achieving an optimal weight will improve Outcome: Progressing   Problem: Skin Integrity: Goal: Risk for impaired skin integrity will decrease Outcome: Progressing

## 2022-05-15 NOTE — Progress Notes (Signed)
[facility], is a 57 y.o. female, DOB - 11/29/1965, [id number] Admit date - 05/10/2022    Outpatient Primary MD for the patient is [name], [name], MD  LOS - 4  days    Brief summary   Patient is a 57 year old female with past medical history of morbid obesity, spinal stenosis uncontrolled diabetes mellitus type 2 with gastroparesis and stage IV chronic kidney disease, atrial fibrillation, hypertension and carcinoid tumor of the duodenum who presented to the emergency room on the evening of 3/4 with 1 week of abdominal pain, nausea and vomiting.  In the emergency room, patient found to have blood sugar of 540 and nonketotic hyperglycemia.  Patient started on IV fluids and insulin drip.  In addition, she reported to the admitting physician that she was having significant pain in her lower extremities which is now much worse even when she tried to pivot in and out of her wheelchair.  Patient was admitted to the hospitalist service.   Assessment & Plan    Assessment and Plan: * Hyperosmolar non-ketotic state due to type 2 diabetes mellitus ([place]) Uncontrolled with hyperglycemia.  CBG (last 3)  Recent Labs    05/15/22 0812 05/15/22 1214 05/15/22 1614  GLUCAP 302* 377* 261*    Increased semglee to 60 units daily, and increased novolog to 20 units TIDAC. IN addition to the SSI.  A!C IS 10.1.   Nausea and vomiting Appears to have resolved  Spinal stenosis, lumbar region with neurogenic claudication MRI of the Lumbar spine ordered and reviewed.  Requested neurosurgery consult, Dr Ronnald Ramp recommended outpatient follow up on discharge, and no surgical intervention needed at this time.   Leukocytosis No source of infection so far. Procalcitonin 0.12, minimal and lactic acid within normal limits.  Probably reactive. UA negative.  CT chest, abdomen and pelvis done  and negative.     PAF (paroxysmal atrial fibrillation) (HCC) Rate controlled with metoprolol and on Eliquis for anticoagulation  Chronic kidney disease (CKD), stage IV (severe) (HCC) Creatinine at baseline around 3.   Malignant carcinoid tumor of duodenum [facility]) Outpatient with oncology  Normocytic anemia Secondary to chronic renal disease.  Patient's baseline is around 7.5,.  She had an isolated erroneous lab on night of 3/4 at 9.2. Hemoglobin around 6.9 this morning, 1 unit of prbc transfusion ordered. Repeat H&H improved to 8.3. and get anemia panel and stool for occult blood. Anemia panel shows low iron. Supplementation will be added on  discharge.  No obvious bleeding evident  Hypomagnesemia and hypokalemia Replaced. Repeat levels wnl.   Hyponatremia probably secondary to hyperglycemia Continue to monitor, recheck labs today.   GERD (gastroesophageal reflux disease) Continue PPI  Morbid obesity (HCC) Body mass index is 49.56 kg/m. Recommend outpatient follow up with [facility] for weight loss.    Left shoulder pain and unable to lift left arm; X rays suspicious for rotator cuff tear.  MRI of the left left shoulder ordered on 2 different occasions.  She has refused it .  Right knee pain and right leg pain:  X rays show severe Severe tricompartmental degenerative changes, most prominent in the lateral compartment. Physical therapy and pain control.  Venous duplex of the lower extremities ordered and negative.    Therapy eval recommending SNF.  Toc on board.  Waiting for placement.       RN Pressure Injury Documentation:    Malnutrition Type:  Nutrition Problem: Inadequate oral intake Etiology: acute illness, decreased appetite   Malnutrition Characteristics:  Signs/Symptoms: per patient/family report   Nutrition Interventions:  Interventions: Boost Breeze, MVI  Estimated body mass index is 49.56 kg/m as calculated from the following:   Height  as of this encounter: 5' 5.98" (1.676 m).   Weight as of this encounter: 139.2 kg.  Code Status: full code.  DVT Prophylaxis:   apixaban (ELIQUIS) tablet 5 mg   Level of Care: Level of care: Med-Surg Family Communication: none at bedside.   Disposition Plan:     Remains inpatient appropriate:  hyperglycemia.   Procedures:  MRI lumbar spine.   Consultants:   None.   Antimicrobials:   Anti-infectives (From admission, onward)    None        Medications  Scheduled Meds:  (feeding supplement) PROSource Plus  30 mL Oral BID BM   apixaban  5 mg Oral BID   atorvastatin  10 mg Oral Daily   feeding supplement  1 Container Oral BID BM   insulin aspart  0-20 Units Subcutaneous TID WC   insulin aspart  0-5 Units Subcutaneous QHS   [START ON 05/16/2022] insulin aspart  20 Units Subcutaneous TID WC   [START ON 05/16/2022] insulin glargine-yfgn  60 Units Subcutaneous Daily   loratadine  10 mg Oral Daily   metoprolol succinate  50 mg Oral Daily   multivitamin with minerals  1 tablet Oral Daily   pantoprazole  40 mg Oral BID   senna-docusate  1 tablet Oral Daily   sodium bicarbonate  650 mg Oral BID   Continuous Infusions:   PRN Meds:.albuterol, dextrose, diazepam, HYDROmorphone (DILAUDID) injection, HYDROmorphone, melatonin, polyethylene glycol    Subjective:   Deborah [name] was seen and examined today.  Refused MRI of the arm again. Pain in the leg improving.   Objective:   Vitals:   05/14/22 1622 05/14/22 2112 05/15/22 0516 05/15/22 1641  BP: (!) 116/57 (!) 147/72 127/60 (!) 145/72  Pulse: 91 100 98 98  Resp: '16 18 18 18  '$ Temp: (!) 97.5 F (36.4 C) (!) 97.5 F (36.4 C) 98.3 F (36.8 C) 98.8 F (37.1 C)  TempSrc: Oral Oral Oral Oral  SpO2: 100% 96% 100% 100%  Weight:      Height:        Intake/Output Summary (Last 24 hours) at 05/15/2022 1706 Last data filed at 05/15/2022 0830 Gross per 24 hour  Intake 240 ml  Output 1100 ml  Net -860 ml    Filed  Weights   05/11/22 0501 05/12/22 0033 05/13/22 0335  Weight: 133.8 kg (!) 138.9 kg (!) 139.2 kg     Exam General exam: Appears calm and comfortable  Respiratory system: Clear to auscultation. Respiratory effort normal. Cardiovascular system: S1 & S2 heard, RRR. No JVD, Gastrointestinal system: Abdomen is nondistended, soft and nontender. Central nervous system: Alert and oriented. No focal neurological deficits. Extremities: right leg swelling improving.  Skin: No rashes, lesions or ulcers Psychiatry: moos is appropriate.        Data Reviewed:  I have personally reviewed following  labs and imaging studies   CBC Lab Results  Component Value Date   WBC 18.6 (H) 05/14/2022   RBC 2.93 (L) 05/14/2022   HGB 8.3 (L) 05/14/2022   HCT 26.6 (L) 05/14/2022   MCV 90.8 05/14/2022   MCH 28.3 05/14/2022   PLT 425 (H) 05/14/2022   MCHC 31.2 05/14/2022   RDW 14.5 05/14/2022   LYMPHSABS 1.8 05/14/2022   MONOABS 1.1 (H) 05/14/2022   EOSABS 0.3 05/14/2022   BASOSABS 0.1 [id number]     Last metabolic panel Lab Results  Component Value Date   NA 129 (L) 05/14/2022   K 4.4 05/14/2022   CL 101 05/14/2022   CO2 15 (L) 05/14/2022   BUN 70 (H) 05/14/2022   CREATININE 3.35 (H) 05/14/2022   GLUCOSE 321 (H) 05/14/2022   GFRNONAA 15 (L) 05/14/2022   GFRAA 49 (L) 11/30/2019   CALCIUM 9.2 05/14/2022   PHOS 2.7 05/11/2022   PROT 7.4 05/12/2022   ALBUMIN 2.4 (L) 05/12/2022   BILITOT 0.5 05/12/2022   ALKPHOS 84 05/12/2022   AST 12 (L) 05/12/2022   ALT 8 05/12/2022   ANIONGAP 13 05/14/2022    CBG (last 3)  Recent Labs    05/15/22 0812 05/15/22 1214 05/15/22 1614  GLUCAP 302* 377* 261*       Coagulation Profile: No results for input(s): "INR", "PROTIME" in the last 168 hours.   Radiology Studies: VAS [place] LOWER EXTREMITY VENOUS (DVT)  Result Date: 05/14/2022  Lower Venous DVT Study Patient Name:  Deborah [name]  Date of Exam:   05/14/2022 Medical Rec #: [id number]              Accession #:    [id number] Date of Birth: [birth year]             Patient Gender: F Patient Age:   52 years Exam Location:  [facility] Procedure:      VAS [place] LOWER EXTREMITY VENOUS (DVT) Referring Phys: Hosie Poisson --------------------------------------------------------------------------------  Indications: Pain, and Swelling.  Limitations: Patient pain with probe pressure. Comparison Study: No prior studies. Performing Technologist: Darlin Coco RDMS, RVT  Examination Guidelines: A complete evaluation includes B-mode imaging, spectral Doppler, color Doppler, and power Doppler as needed of all accessible portions of each vessel. Bilateral testing is considered an integral part of a complete examination. Limited examinations for reoccurring indications may be performed as noted. The reflux portion of the exam is performed with the patient in reverse Trendelenburg.  +---------+---------------+---------+-----------+----------+-------------------+ RIGHT    CompressibilityPhasicitySpontaneityPropertiesThrombus Aging      +---------+---------------+---------+-----------+----------+-------------------+ CFV      Full           Yes      Yes                                      +---------+---------------+---------+-----------+----------+-------------------+ SFJ      Full                                                             +---------+---------------+---------+-----------+----------+-------------------+ FV Prox  Full                                                             +---------+---------------+---------+-----------+----------+-------------------+  FV Mid   Full                                                             +---------+---------------+---------+-----------+----------+-------------------+ FV Distal               Yes      Yes                                      +---------+---------------+---------+-----------+----------+-------------------+ PFV       Full                                                             +---------+---------------+---------+-----------+----------+-------------------+ POP      Full           Yes      Yes                                      +---------+---------------+---------+-----------+----------+-------------------+ PTV      Full                                                             +---------+---------------+---------+-----------+----------+-------------------+ PERO                                                  Not well visualized +---------+---------------+---------+-----------+----------+-------------------+   +----+---------------+---------+-----------+----------+--------------+ LEFTCompressibilityPhasicitySpontaneityPropertiesThrombus Aging +----+---------------+---------+-----------+----------+--------------+ CFV Full           Yes      Yes                                 +----+---------------+---------+-----------+----------+--------------+    Summary: RIGHT: - There is no evidence of deep vein thrombosis in the lower extremity. However, portions of this examination were limited- see technologist comments above.  - No cystic structure found in the popliteal fossa.  LEFT: - No evidence of common femoral vein obstruction.  *See table(s) above for measurements and observations.    Preliminary        Hosie Poisson M.D. [facility] 05/15/2022, 5:06 PM  Available via Epic secure chat 7am-7pm After 7 pm, please refer to night coverage provider listed on amion.

## 2022-05-15 NOTE — Plan of Care (Signed)
1200: RN was called by MRI to see if pt willing to go down for ordered MRI of left shoulder. RN went to speak with pt, pt stated at this time she is unsure if she is willing to go to MRI, and does not want to go at this time, pt states she "does not want to waste their time". RN educated pt that she does have a x1 dose of valium ordered for the MRI, but RN noticed valium is listed as an allergy. Pt stated that she was given valium earlier in the week and it went okay but she did feel her heart racing when she was given the medication, Pt unsure if she wants to take valium on call to MRI. RN notified MRI pt declining to go at this time.    1700:Pt refusing to go to MRI, pt states " I will not be going", pt wanting MD to discontinue the order, RN notified MD

## 2022-05-16 DIAGNOSIS — R112 Nausea with vomiting, unspecified: Secondary | ICD-10-CM | POA: Diagnosis not present

## 2022-05-16 DIAGNOSIS — M48062 Spinal stenosis, lumbar region with neurogenic claudication: Secondary | ICD-10-CM | POA: Diagnosis not present

## 2022-05-16 DIAGNOSIS — I10 Essential (primary) hypertension: Secondary | ICD-10-CM | POA: Diagnosis not present

## 2022-05-16 DIAGNOSIS — E1100 Type 2 diabetes mellitus with hyperosmolarity without nonketotic hyperglycemic-hyperosmolar coma (NKHHC): Secondary | ICD-10-CM | POA: Diagnosis not present

## 2022-05-16 LAB — GLUCOSE, CAPILLARY
Glucose-Capillary: 361 mg/dL — ABNORMAL HIGH (ref 70–99)
Glucose-Capillary: 278 mg/dL — ABNORMAL HIGH (ref 70–99)
Glucose-Capillary: 131 mg/dL — ABNORMAL HIGH (ref 70–99)
Glucose-Capillary: 234 mg/dL — ABNORMAL HIGH (ref 70–99)

## 2022-05-16 MED ORDER — TORSEMIDE 20 MG PO TABS
50.0000 mg | ORAL_TABLET | Freq: Two times a day (BID) | ORAL | Status: DC
  Filled 2022-05-16: qty 3

## 2022-05-16 MED ORDER — INSULIN ASPART 100 UNIT/ML IJ SOLN
25.0000 [IU] | Freq: Three times a day (TID) | INTRAMUSCULAR | Status: DC
  Administered 2022-05-17 (×2): 25 [IU] via SUBCUTANEOUS

## 2022-05-16 MED ORDER — TORSEMIDE 100 MG PO TABS: 500.0000 mg | ORAL_TABLET | Freq: Two times a day (BID) | ORAL | Status: DC

## 2022-05-16 MED ORDER — FUROSEMIDE 40 MG PO TABS
80.0000 mg | ORAL_TABLET | Freq: Three times a day (TID) | ORAL | Status: DC
  Administered 2022-05-16 – 2022-05-24 (×21): 80 mg via ORAL
  Filled 2022-05-16 (×27): qty 2

## 2022-05-16 MED ORDER — FAMOTIDINE 20 MG PO TABS
20.0000 mg | ORAL_TABLET | Freq: Every day | ORAL | Status: DC
  Administered 2022-05-16 – 2022-05-27 (×11): 20 mg via ORAL
  Filled 2022-05-16 (×12): qty 1

## 2022-05-16 MED ORDER — ONDANSETRON HCL 4 MG/2ML IJ SOLN
4.0000 mg | Freq: Four times a day (QID) | INTRAMUSCULAR | Status: DC | PRN
  Administered 2022-05-16: 4 mg via INTRAVENOUS
  Filled 2022-05-16 (×2): qty 2

## 2022-05-16 MED ORDER — DULOXETINE HCL 20 MG PO CPEP
40.0000 mg | ORAL_CAPSULE | Freq: Two times a day (BID) | ORAL | Status: DC
  Administered 2022-05-16 – 2022-05-27 (×21): 40 mg via ORAL
  Filled 2022-05-16 (×25): qty 2

## 2022-05-16 NOTE — Progress Notes (Signed)
Pt educated on need for bed alarm for safety; pt verbalized teach back & understanding. Pt declines bed alarm.

## 2022-05-16 NOTE — Progress Notes (Signed)
   05/16/22 0447 05/16/22 0447 05/16/22 0514  Assess: MEWS Score  Temp  --  98.4 F (36.9 C)  --   BP  --  126/83  --   MAP (mmHg)  --  97  --   Pulse Rate  --  (!) 118 (!) 120  Resp  --  17 20  Level of Consciousness  --   --  Alert  SpO2  --  99 %  --   O2 Device  --  Room Air  --   Assess: MEWS Score  MEWS Temp  --  0 0  MEWS Systolic  --  0 0  MEWS Pulse  --  2 2  MEWS RR  --  0 0  MEWS LOC  --  0 0  MEWS Score  --  2 2  MEWS Score Color  --  Yellow Yellow  Assess: if the MEWS score is Yellow or Red  Were vital signs taken at a resting state? Yes  --   --   Focused Assessment No change from prior assessment  --   --   Does the patient meet 2 or more of the SIRS criteria? No  --   --   MEWS guidelines implemented  Yes, yellow  --   --   Treat  MEWS Interventions Considered administering scheduled or prn medications/treatments as ordered  --   --   Take Vital Signs  Increase Vital Sign Frequency  Yellow: Q2hr x1, continue Q4hrs until patient remains green for 12hrs  --   --   Escalate  MEWS: Escalate Yellow: Discuss with charge nurse and consider notifying provider and/or RRT  --   --   Notify: Charge Nurse/RN  Name of Charge Nurse/RN Notified Lorri Frederick RN  --   --   Provider Notification  Provider Name/Title Opyd MD  --   --   Date Provider Notified 05/16/22  --   --   Time Provider Notified [phone]  --   --   Method of Notification Page  --   --   Notification Reason Change in status (Yellow MEWS d/t elevated pulse)  --   --   Assess: SIRS CRITERIA  SIRS Temperature   --  0 0  SIRS Pulse  --  1 1  SIRS Respirations   --  0 0  SIRS WBC  --  0 1  SIRS Score Sum   --  1 2

## 2022-05-16 NOTE — Plan of Care (Signed)
RN asked pt if she is able to stand, RN encouraged getting up to go to the bathroom instead of using pure wick if possible, Per pt we cannot remove pure wick because she is "wheelchair bound and unable to get up."

## 2022-05-16 NOTE — Progress Notes (Signed)
Pt sitting on edge of bed - pt had independently sat up to EOB. Stated that she wanted to get back into the bed, and that she needed help moving her legs back in. This RN picked up her legs with resistance; pt then put herself back to dangling on EOB stating that she has a panic attack every time she lays down in bed. Pt was previously observed during handoff rounding to be sleeping.

## 2022-05-16 NOTE — Progress Notes (Signed)
[facility] [place], is a 57 y.o. female, DOB - 09/22/1965, [id number] Admit date - 05/10/2022    Outpatient Primary MD for the patient is [name], [name], MD  LOS - 5  days    Brief summary   Patient is a 57 year old female with past medical history of morbid obesity, spinal stenosis uncontrolled diabetes mellitus type 2 with gastroparesis and stage IV chronic kidney disease, atrial fibrillation, hypertension and carcinoid tumor of the duodenum who presented to the emergency room on the evening of 3/4 with 1 week of abdominal pain, nausea and vomiting.  In the emergency room, patient found to have blood sugar of 540 and nonketotic hyperglycemia.  Patient started on IV fluids and insulin drip.  In addition, she reported to the admitting physician that she was having significant pain in her lower extremities which is now much worse even when she tried to pivot in and out of her wheelchair.  Patient was admitted to the hospitalist service.   Assessment & Plan    Assessment and Plan: * Hyperosmolar non-ketotic state due to type 2 diabetes mellitus ([place]) Uncontrolled with hyperglycemia.  CBG (last 3)  Recent Labs    05/15/22 2007 05/16/22 0831 05/16/22 1149  GLUCAP 245* 361* 278*    Increased semglee to 60 units daily, and increased novolog to 25 units TIDAC. IN addition to the  resistant scale SSI.  A!C IS 10.1.   Nausea and vomiting Appears to have resolved  Spinal stenosis, lumbar region with neurogenic claudication Patient reports that she is wheelchair bound since many months.Worsening pain in the lower extremities more in the right.  MRI of the Lumbar spine ordered and reviewed.  Requested neurosurgery consult, Dr Ronnald Ramp recommended outpatient follow up on discharge, and no surgical intervention needed at this time.   Leukocytosis No source of infection so  far. Procalcitonin 0.12, minimal and lactic acid within normal limits.  Probably reactive. UA negative.  CT chest, abdomen and pelvis done and negative for infection.     PAF (paroxysmal atrial fibrillation) (HCC) Rate controlled with metoprolol 50 mg daily and on Eliquis for anticoagulation  Chronic kidney disease (CKD), stage IV (severe) (HCC) Creatinine at baseline around 3.   Malignant carcinoid tumor of duodenum [facility]) Outpatient with oncology  Normocytic anemia Secondary to chronic renal disease.  Patient's baseline is around 7.5,.  She had an isolated erroneous lab on night of 3/4 at 9.2. Hemoglobin around 6.9 this morning, 1 unit of prbc transfusion ordered. Repeat H&H improved to 8.3. and get anemia panel and stool for occult blood. Anemia panel shows low iron. Supplementation will be added on  discharge.  No obvious bleeding evident  Hypomagnesemia and hypokalemia Replaced. Repeat levels wnl.   Hyponatremia probably secondary to hyperglycemia Continue to monitor. Sodium has improved from 129 to 131.   GERD (gastroesophageal reflux disease) Continue PPI  Morbid obesity (HCC) Body mass index is 49.56 kg/m. Recommend outpatient follow up with PCp/ bariatric surgery for weight loss.    Left shoulder pain and  unable to lift left arm; X rays suspicious for rotator cuff tear.  MRI of the left left shoulder ordered on 2 different occasions.  She has refused it . She does not want any CT's or MRI's at this time. Recommend outpatient follow up with orthopedics.     Right knee pain and right leg pain:  X rays show severe Severe tricompartmental degenerative changes, most prominent in the lateral compartment. Physical therapy and pain control.  Venous duplex of the lower extremities ordered and negative.    Therapy eval recommending SNF.  Toc on board.  Waiting for placement.      Bilateral leg edema:  Restarted home dose of torsemide 50 mg BID.    RN  Pressure Injury Documentation:    Malnutrition Type:  Nutrition Problem: Inadequate oral intake Etiology: acute illness, decreased appetite   Malnutrition Characteristics:  Signs/Symptoms: per patient/family report   Nutrition Interventions:  Interventions: Boost Breeze, MVI  Estimated body mass index is 49.56 kg/m as calculated from the following:   Height as of this encounter: 5' 5.98" (1.676 m).   Weight as of this encounter: 139.2 kg.  Code Status: full code.  DVT Prophylaxis:   apixaban (ELIQUIS) tablet 5 mg   Level of Care: Level of care: Med-Surg Family Communication: none at bedside.   Disposition Plan:     Remains inpatient appropriate: SNF placement.   Procedures:  MRI lumbar spine.   Consultants:   None.   Antimicrobials:   Anti-infectives (From admission, onward)    None        Medications  Scheduled Meds:  (feeding supplement) PROSource Plus  30 mL Oral BID BM   apixaban  5 mg Oral BID   atorvastatin  10 mg Oral Daily   feeding supplement  1 Container Oral BID BM   insulin aspart  0-20 Units Subcutaneous TID WC   insulin aspart  0-5 Units Subcutaneous QHS   insulin aspart  20 Units Subcutaneous TID WC   insulin glargine-yfgn  60 Units Subcutaneous Daily   loratadine  10 mg Oral Daily   metoprolol succinate  50 mg Oral Daily   multivitamin with minerals  1 tablet Oral Daily   pantoprazole  40 mg Oral BID   senna-docusate  1 tablet Oral Daily   sodium bicarbonate  650 mg Oral BID   Continuous Infusions:   PRN Meds:.albuterol, dextrose, diazepam, HYDROmorphone (DILAUDID) injection, HYDROmorphone, melatonin, polyethylene glycol    Subjective:   Deborah [name] was seen and examined today.   Leg edema, restarted her on her home meds.   Objective:   Vitals:   05/16/22 0514 05/16/22 0700 05/16/22 0829 05/16/22 1230  BP:  122/73 139/66 137/60  Pulse: (!) 120 (!) 118 (!) 106 (!) 104  Resp: '20 16 16 17  '$ Temp:  98.6 F (37 C)  98.2 F (36.8 C) 98.4 F (36.9 C)  TempSrc:  Oral Oral Oral  SpO2:   99% 100%  Weight:      Height:        Intake/Output Summary (Last 24 hours) at 05/16/2022 1415 Last data filed at 05/16/2022 0830 Gross per 24 hour  Intake 480 ml  Output 1850 ml  Net -1370 ml    Filed Weights   05/11/22 0501 05/12/22 0033 05/13/22 0335  Weight: 133.8 kg (!) 138.9 kg (!) 139.2 kg     Exam General exam: obese lady, not in distress.  Respiratory system: Clear to auscultation. Respiratory effort normal. Cardiovascular system: S1 &  S2 heard, RRR. No JVD,  Gastrointestinal system: Abdomen is nondistended, soft and nontender. Central nervous system: Alert and oriented. Wheelchair bound.  Extremities: bilateral leg edema.  Skin: No rashes,  Psychiatry: Mood & affect appropriate.         Data Reviewed:  I have personally reviewed following labs and imaging studies   CBC Lab Results  Component Value Date   WBC 15.0 (H) 05/15/2022   RBC 2.69 (L) 05/15/2022   HGB 7.6 (L) 05/15/2022   HCT 24.2 (L) 05/15/2022   MCV 90.0 05/15/2022   MCH 28.3 05/15/2022   PLT 492 (H) 05/15/2022   MCHC 31.4 05/15/2022   RDW 14.3 05/15/2022   LYMPHSABS 1.7 05/15/2022   MONOABS 1.3 (H) 05/15/2022   EOSABS 0.4 05/15/2022   BASOSABS 0.1 [id number]     Last metabolic panel Lab Results  Component Value Date   NA 131 (L) 05/15/2022   K 4.0 05/15/2022   CL 99 05/15/2022   CO2 21 (L) 05/15/2022   BUN 70 (H) 05/15/2022   CREATININE 3.06 (H) 05/15/2022   GLUCOSE 250 (H) 05/15/2022   GFRNONAA 17 (L) 05/15/2022   GFRAA 49 (L) 11/30/2019   CALCIUM 9.3 05/15/2022   PHOS 2.7 05/11/2022   PROT 7.4 05/12/2022   ALBUMIN 2.4 (L) 05/12/2022   BILITOT 0.5 05/12/2022   ALKPHOS 84 05/12/2022   AST 12 (L) 05/12/2022   ALT 8 05/12/2022   ANIONGAP 11 05/15/2022    CBG (last 3)  Recent Labs    05/15/22 2007 05/16/22 0831 05/16/22 1149  GLUCAP 245* 361* 278*       Coagulation Profile: No results for  input(s): "INR", "PROTIME" in the last 168 hours.   Radiology Studies: VAS [place] LOWER EXTREMITY VENOUS (DVT)  Result Date: 05/14/2022  Lower Venous DVT Study Patient Name:  Deborah [name]  Date of Exam:   05/14/2022 Medical Rec #: [id number]             Accession #:    [id number] Date of Birth: June 18, 1965             Patient Gender: F Patient Age:   58 years Exam Location:  [facility] Procedure:      VAS [place] LOWER EXTREMITY VENOUS (DVT) Referring Phys: Hosie Poisson --------------------------------------------------------------------------------  Indications: Pain, and Swelling.  Limitations: Patient pain with probe pressure. Comparison Study: No prior studies. Performing Technologist: Darlin Coco RDMS, RVT  Examination Guidelines: A complete evaluation includes B-mode imaging, spectral Doppler, color Doppler, and power Doppler as needed of all accessible portions of each vessel. Bilateral testing is considered an integral part of a complete examination. Limited examinations for reoccurring indications may be performed as noted. The reflux portion of the exam is performed with the patient in reverse Trendelenburg.  +---------+---------------+---------+-----------+----------+-------------------+ RIGHT    CompressibilityPhasicitySpontaneityPropertiesThrombus Aging      +---------+---------------+---------+-----------+----------+-------------------+ CFV      Full           Yes      Yes                                      +---------+---------------+---------+-----------+----------+-------------------+ SFJ      Full                                                             +---------+---------------+---------+-----------+----------+-------------------+  FV Prox  Full                                                             +---------+---------------+---------+-----------+----------+-------------------+ FV Mid   Full                                                              +---------+---------------+---------+-----------+----------+-------------------+ FV Distal               Yes      Yes                                      +---------+---------------+---------+-----------+----------+-------------------+ PFV      Full                                                             +---------+---------------+---------+-----------+----------+-------------------+ POP      Full           Yes      Yes                                      +---------+---------------+---------+-----------+----------+-------------------+ PTV      Full                                                             +---------+---------------+---------+-----------+----------+-------------------+ PERO                                                  Not well visualized +---------+---------------+---------+-----------+----------+-------------------+   +----+---------------+---------+-----------+----------+--------------+ LEFTCompressibilityPhasicitySpontaneityPropertiesThrombus Aging +----+---------------+---------+-----------+----------+--------------+ CFV Full           Yes      Yes                                 +----+---------------+---------+-----------+----------+--------------+    Summary: RIGHT: - There is no evidence of deep vein thrombosis in the lower extremity. However, portions of this examination were limited- see technologist comments above.  - No cystic structure found in the popliteal fossa.  LEFT: - No evidence of common femoral vein obstruction.  *See table(s) above for measurements and observations.    Preliminary        Hosie Poisson M.D. [facility] 05/16/2022, 2:15 PM  Available via Epic secure chat 7am-7pm After 7 pm, please refer to night coverage provider listed on amion.

## 2022-05-16 NOTE — Progress Notes (Signed)
Pt frequently restless/anxious overnight; frequent requests for Dilaudid. At times generalized tremors with itching unrelieved by scratching.

## 2022-05-17 DIAGNOSIS — E1100 Type 2 diabetes mellitus with hyperosmolarity without nonketotic hyperglycemic-hyperosmolar coma (NKHHC): Secondary | ICD-10-CM | POA: Diagnosis not present

## 2022-05-17 DIAGNOSIS — R112 Nausea with vomiting, unspecified: Secondary | ICD-10-CM | POA: Diagnosis not present

## 2022-05-17 DIAGNOSIS — M48062 Spinal stenosis, lumbar region with neurogenic claudication: Secondary | ICD-10-CM | POA: Diagnosis not present

## 2022-05-17 DIAGNOSIS — I10 Essential (primary) hypertension: Secondary | ICD-10-CM | POA: Diagnosis not present

## 2022-05-17 LAB — BPAM RBC
Blood Product Expiration Date: 202404042359
Blood Product Expiration Date: 202404042359
ISSUE DATE / TIME: 202403071253
Unit Type and Rh: 5100
Unit Type and Rh: 5100

## 2022-05-17 LAB — TYPE AND SCREEN
ABO/RH(D): O POS
Antibody Screen: NEGATIVE
Donor AG Type: NEGATIVE
Donor AG Type: NEGATIVE
Unit division: 0
Unit division: 0

## 2022-05-17 LAB — GLUCOSE, CAPILLARY
Glucose-Capillary: 263 mg/dL — ABNORMAL HIGH (ref 70–99)
Glucose-Capillary: 128 mg/dL — ABNORMAL HIGH (ref 70–99)
Glucose-Capillary: 61 mg/dL — ABNORMAL LOW (ref 70–99)
Glucose-Capillary: 64 mg/dL — ABNORMAL LOW (ref 70–99)
Glucose-Capillary: 111 mg/dL — ABNORMAL HIGH (ref 70–99)
Glucose-Capillary: 155 mg/dL — ABNORMAL HIGH (ref 70–99)

## 2022-05-17 MED ORDER — INSULIN GLARGINE-YFGN 100 UNIT/ML ~~LOC~~ SOLN
50.0000 [IU] | Freq: Every day | SUBCUTANEOUS | Status: DC
  Administered 2022-05-18 – 2022-05-19 (×2): 50 [IU] via SUBCUTANEOUS
  Filled 2022-05-17 (×3): qty 0.5

## 2022-05-17 MED ORDER — HYDROMORPHONE HCL 1 MG/ML IJ SOLN
0.5000 mg | Freq: Three times a day (TID) | INTRAMUSCULAR | Status: DC | PRN
  Administered 2022-05-17 – 2022-05-18 (×3): 0.5 mg via INTRAVENOUS
  Filled 2022-05-17 (×4): qty 0.5

## 2022-05-17 MED ORDER — INSULIN ASPART 100 UNIT/ML IJ SOLN
15.0000 [IU] | Freq: Three times a day (TID) | INTRAMUSCULAR | Status: DC
  Administered 2022-05-18 – 2022-05-19 (×4): 15 [IU] via SUBCUTANEOUS

## 2022-05-17 MED ORDER — DICLOFENAC SODIUM 1 % EX GEL
4.0000 g | Freq: Four times a day (QID) | CUTANEOUS | Status: DC
  Administered 2022-05-17 – 2022-05-24 (×19): 4 g via TOPICAL
  Filled 2022-05-17 (×2): qty 100

## 2022-05-17 MED ORDER — FERROUS SULFATE 325 (65 FE) MG PO TABS
325.0000 mg | ORAL_TABLET | Freq: Two times a day (BID) | ORAL | Status: DC
  Administered 2022-05-18 – 2022-05-27 (×16): 325 mg via ORAL
  Filled 2022-05-17 (×20): qty 1

## 2022-05-17 NOTE — Progress Notes (Signed)
Physical Therapy Treatment Patient Details Name: Deborah [name] MRN: [id number] DOB: June 06, 1965 Today's Date: 05/17/2022   History of Present Illness 57 y.o. female with medical history significant of DM2, CKD IV, A.fib    Gastric ulcer, carcinoid tumor of the duodenum, systolic CHF EF A999333, HTN    Admitted for   HHS, leg pain possible sciatica    PT Comments    Pt admitted with above diagnosis. Pt was only agreeable to come to EOB and perform some exercises today.  Pt reports that PTA she performed wheelchair scoot transfers only and did not walk. Pt  in a lot of pain and nursing had given pain meds at 1000.  Will continue to follow acutely and pt still needs SNF. Pt currently with functional limitations due to the deficits listed below (see PT Problem List). Pt will benefit from skilled PT to increase their independence and safety with mobility to allow discharge to the venue listed below.      Recommendations for follow up therapy are one component of a multi-disciplinary discharge planning process, led by the attending physician.  Recommendations may be updated based on patient status, additional functional criteria and insurance authorization.  Follow Up Recommendations  Skilled nursing-short term rehab (<3 hours/day) (However could progress to HHPT if pt progresses) Can patient physically be transported by private vehicle: No   Assistance Recommended at Discharge Frequent or constant Supervision/Assistance  Patient can return home with the following A lot of help with walking and/or transfers;A lot of help with bathing/dressing/bathroom;Assistance with cooking/housework;Assist for transportation;Help with stairs or ramp for entrance   Equipment Recommendations  Other (comment) (bariatric drop arm commode, recommend seating assessment for wheelchair while at SNF)    Recommendations for Other Services       Precautions / Restrictions Precautions Precautions:  Fall Restrictions Weight Bearing Restrictions: No     Mobility  Bed Mobility Overal bed mobility: Needs Assistance Bed Mobility: Supine to Sit, Sit to Supine Rolling: Supervision   Supine to sit: Supervision, HOB elevated     General bed mobility comments: Pt needs increased time with bed mobility due to pain. Prefers HOB elevated and  uses rails to assist with bed mobility.  Pt refused to stand and refused to lay back down once on EOB.  Pt stated she couldnt do a LAQ on right LE however when pt asked to stay seated at EOB, she lifted right LE over the base of the bedside table fairly easily.    Transfers                        Ambulation/Gait                   Stairs             Wheelchair Mobility    Modified Rankin (Stroke Patients Only)       Balance Overall balance assessment: Needs assistance Sitting-balance support: Bilateral upper extremity supported, Feet supported Sitting balance-Leahy Scale: Normal       Standing balance-Leahy Scale: Zero Standing balance comment: refused to stand                            Cognition Arousal/Alertness: Awake/alert Behavior During Therapy: WFL for tasks assessed/performed Overall Cognitive Status: Within Functional Limits for tasks assessed  Exercises General Exercises - Lower Extremity Ankle Circles/Pumps: AROM, Both, 5 reps, Supine Long Arc Quad: AAROM, Both, 5 reps, Seated    General Comments        Pertinent Vitals/Pain Pain Assessment Pain Assessment: Faces Faces Pain Scale: Hurts whole lot Breathing: normal Negative Vocalization: none Pain Location: bil LEs Pain Descriptors / Indicators: Constant, Shooting Pain Intervention(s): Limited activity within patient's tolerance, Monitored during session, Repositioned, Premedicated before session, Patient requesting pain meds-RN notified    Home Living                           Prior Function            PT Goals (current goals can now be found in the care plan section) Acute Rehab PT Goals Patient Stated Goal: To get rid of this pain Progress towards PT goals: Progressing toward goals    Frequency    Min 3X/week      PT Plan Current plan remains appropriate    Co-evaluation              AM-PAC PT "6 Clicks" Mobility   Outcome Measure  Help needed turning from your back to your side while in a flat bed without using bedrails?: A Little Help needed moving from lying on your back to sitting on the side of a flat bed without using bedrails?: A Little Help needed moving to and from a bed to a chair (including a wheelchair)?: Total Help needed standing up from a chair using your arms (e.g., wheelchair or bedside chair)?: A Lot Help needed to walk in hospital room?: Total Help needed climbing 3-5 steps with a railing? : Total 6 Click Score: 11    End of Session Equipment Utilized During Treatment: Gait belt Activity Tolerance: Patient limited by pain Patient left: in bed;with call bell/phone within reach (sitting EOB) Nurse Communication: Mobility status PT Visit Diagnosis: Muscle weakness (generalized) (M62.81);Pain Pain - Right/Left: Left Pain - part of body: Shoulder     Time: 1240-1301 PT Time Calculation (min) (ACUTE ONLY): 21 min  Charges:  $Therapeutic Activity: 8-22 mins                     [facility] M,PT Acute Rehab Services [phone]    Alvira Philips 05/17/2022, 2:56 PM

## 2022-05-17 NOTE — TOC Progression Note (Signed)
Transition of Care [facility]) - Progression Note    Patient Details  Name: Deborah [name] MRN: [id number] Date of Birth: 1966-01-23  [facility] of [facility]) CM/SW Contact  Jinger Neighbors, [place] Phone Number: 05/17/2022, 2:00 PM  Clinical Narrative:     CSW met with pt at bedside to discuss bed offers. CSW researched [facility] and [facility] at bedside with pt. Pt is agreeable to [facility]. CSW messaged [employer] to request auth.   Expected Discharge Plan: [place] Barriers to Discharge: Continued Medical Work up  Expected Discharge Plan and Services In-house Referral: NA Discharge Planning Services: CM Consult Post Acute Care Choice: [place] Living arrangements for the past 2 months: Single Family Home                 DME Arranged: Bedside commode DME Agency: [employer] Date DME Agency Contacted: 05/12/22 Time DME Agency Contacted: [phone] Representative spoke with at DME Agency: Erasmo Downer HH Arranged: RN, Disease Management, PT, Social Work [employer] Agency: [place] Date [place]: 05/12/22 Time [place]: [place] Representative spoke with at Center: [place] Determinants of Health ([place]) Interventions [place]: No Food Insecurity (05/11/2022)  Housing: Low Risk  (05/11/2022)  Transportation Needs: No Transportation Needs (05/11/2022)  Utilities: Not At Risk (05/11/2022)  Depression (PHQ2-9): Medium Risk (09/16/2021)  Tobacco Use: Low Risk  (05/10/2022)    Readmission Risk Interventions     No data to display

## 2022-05-17 NOTE — Progress Notes (Signed)
[facility] [place], is a 57 y.o. female, DOB - 07/12/1965, [id number] Admit date - 05/10/2022    Outpatient Primary MD for the patient is [name], [name], MD  LOS - 6  days    Brief summary   Patient is a 57 year old female with past medical history of morbid obesity, spinal stenosis uncontrolled diabetes mellitus type 2 with gastroparesis and stage IV chronic kidney disease, atrial fibrillation, hypertension and carcinoid tumor of the duodenum who presented to the emergency room on the evening of 3/4 with 1 week of abdominal pain, nausea and vomiting.  In the emergency room, patient found to have blood sugar of 540 and nonketotic hyperglycemia.  Patient started on IV fluids and insulin drip.  In addition, she reported to the admitting physician that she was having significant pain in her lower extremities which is now much worse even when she tried to pivot in and out of her wheelchair.  Patient was admitted to the hospitalist service.   Assessment & Plan    Assessment and Plan: * Hyperosmolar non-ketotic state due to type 2 diabetes mellitus ([place]) Uncontrolled with hyperglycemia.  CBG (last 3)  Recent Labs    05/17/22 0734 05/17/22 1206 05/17/22 1619  GLUCAP 263* 128* 61*    The last CBG was low, will cut down on the novolog to 15 TIDAC. Decrease the semglee to 50 units daily,  addition to the  resistant scale SSI.  A!C IS 10.1.   Nausea and vomiting Appears to have resolved  Spinal stenosis, lumbar region with neurogenic claudication Patient reports that she is wheelchair bound since many months.Worsening pain in the lower extremities more in the right.  MRI of the Lumbar spine ordered and reviewed.  Requested neurosurgery consult, Dr Ronnald Ramp recommended outpatient follow up on discharge, and no surgical intervention needed at this time.   Leukocytosis No  source of infection so far. Procalcitonin 0.12, minimal and lactic acid within normal limits.  Probably reactive. UA negative.  CT chest, abdomen and pelvis done and negative for infection.     PAF (paroxysmal atrial fibrillation) (HCC) Rate controlled with metoprolol 50 mg daily and on Eliquis for anticoagulation  Chronic kidney disease (CKD), stage IV (severe) (HCC) Creatinine at baseline around 3. Creatinine is back to 3.06.   Malignant carcinoid tumor of duodenum [facility]) Outpatient with oncology  Normocytic anemia Secondary to chronic renal disease.  Patient's baseline is around 7.5,.  She had an isolated erroneous lab on night of 3/4 at 9.2. Hemoglobin around 6.9 , 1 unit of prbc transfusion ordered. Repeat H&H improved to 8.3. and get anemia panel and stool for occult blood. Anemia panel shows low iron. Supplementation will be added on  discharge.  No obvious bleeding evident She would benefit from outpatient PROCRIT.  Iron supplementation added.   Hypomagnesemia and hypokalemia Replaced. Repeat levels wnl.   Hyponatremia probably secondary to hyperglycemia Continue to monitor. Sodium has improved from 129 to 131.   GERD (gastroesophageal reflux disease) Continue PPI  Morbid obesity ([place]) Body  mass index is 49.56 kg/m. Recommend outpatient follow up with PCp/ bariatric surgery for weight loss.    Left shoulder pain and unable to lift left arm; X rays suspicious for rotator cuff tear.  MRI of the left left shoulder ordered on 2 different occasions.  She has refused it . She does not want any CT's or MRI's at this time. Recommend outpatient follow up with orthopedics.     Right knee pain and right leg pain:  X rays show severe Severe tricompartmental degenerative changes, most prominent in the lateral compartment. Physical therapy and pain control.  Venous duplex of the lower extremities ordered and negative.    Therapy eval recommending SNF.  Toc on board.   Waiting for placement.      Bilateral leg edema:  Restarted home dose of torsemide 50 mg BID. But patient says torsemide does not work , so transitioned to 80 mg of lasix TID.    RN Pressure Injury Documentation:    Malnutrition Type:  Nutrition Problem: Inadequate oral intake Etiology: acute illness, decreased appetite   Malnutrition Characteristics:  Signs/Symptoms: per patient/family report   Nutrition Interventions:  Interventions: Boost Breeze, MVI  Estimated body mass index is 49.56 kg/m as calculated from the following:   Height as of this encounter: 5' 5.98" (1.676 m).   Weight as of this encounter: 139.2 kg.  Code Status: full code.  DVT Prophylaxis:   apixaban (ELIQUIS) tablet 5 mg   Level of Care: Level of care: Med-Surg Family Communication: none at bedside.   Disposition Plan:     Remains inpatient appropriate: SNF placement.   Procedures:  MRI lumbar spine.   Consultants:   None.   Antimicrobials:   Anti-infectives (From admission, onward)    None        Medications  Scheduled Meds:  (feeding supplement) PROSource Plus  30 mL Oral BID BM   apixaban  5 mg Oral BID   atorvastatin  10 mg Oral Daily   diclofenac Sodium  4 g Topical QID   DULoxetine  40 mg Oral BID   famotidine  20 mg Oral Daily   feeding supplement  1 Container Oral BID BM   furosemide  80 mg Oral TID   insulin aspart  0-20 Units Subcutaneous TID WC   insulin aspart  0-5 Units Subcutaneous QHS   insulin aspart  25 Units Subcutaneous TID WC   insulin glargine-yfgn  60 Units Subcutaneous Daily   loratadine  10 mg Oral Daily   metoprolol succinate  50 mg Oral Daily   multivitamin with minerals  1 tablet Oral Daily   pantoprazole  40 mg Oral BID   senna-docusate  1 tablet Oral Daily   sodium bicarbonate  650 mg Oral BID   Continuous Infusions:   PRN Meds:.albuterol, dextrose, diazepam, HYDROmorphone (DILAUDID) injection, HYDROmorphone, melatonin, ondansetron  (ZOFRAN) IV, polyethylene glycol    Subjective:   Deborah [name] was seen and examined today.     Objective:   Vitals:   05/16/22 1624 05/16/22 1953 05/17/22 0528 05/17/22 1609  BP: (!) 121/57 128/85 118/71 127/70  Pulse: 96 99 90 95  Resp: '18 20 20 16  '$ Temp: 98.7 F (37.1 C) 98.3 F (36.8 C) (!) 97.4 F (36.3 C) 98.1 F (36.7 C)  TempSrc: Oral Oral Oral Oral  SpO2: 100% 100% 100% 100%  Weight:      Height:       No intake or output data in the 24 hours ending 05/17/22  Rule   05/11/22 0501 05/12/22 0033 05/13/22 0335  Weight: 133.8 kg (!) 138.9 kg (!) 139.2 kg     Exam General exam: Appears calm and comfortable  Respiratory system: Clear to auscultation. Respiratory effort normal. Cardiovascular system: S1 & S2 heard, RRR. No JVD, Gastrointestinal system: Abdomen is nondistended, soft and nontender.  Central nervous system: Alert and oriented. No focal neurological deficits. Extremities: Symmetric 5 x 5 power. Skin: No rashes,  Psychiatry: Mood & affect appropriate.         Data Reviewed:  I have personally reviewed following labs and imaging studies   CBC Lab Results  Component Value Date   WBC 15.0 (H) 05/15/2022   RBC 2.69 (L) 05/15/2022   HGB 7.6 (L) 05/15/2022   HCT 24.2 (L) 05/15/2022   MCV 90.0 05/15/2022   MCH 28.3 05/15/2022   PLT 492 (H) 05/15/2022   MCHC 31.4 05/15/2022   RDW 14.3 05/15/2022   LYMPHSABS 1.7 05/15/2022   MONOABS 1.3 (H) 05/15/2022   EOSABS 0.4 05/15/2022   BASOSABS 0.1 [id number]     Last metabolic panel Lab Results  Component Value Date   NA 131 (L) 05/15/2022   K 4.0 05/15/2022   CL 99 05/15/2022   CO2 21 (L) 05/15/2022   BUN 70 (H) 05/15/2022   CREATININE 3.06 (H) 05/15/2022   GLUCOSE 250 (H) 05/15/2022   GFRNONAA 17 (L) 05/15/2022   GFRAA 49 (L) 11/30/2019   CALCIUM 9.3 05/15/2022   PHOS 2.7 05/11/2022   PROT 7.4 05/12/2022   ALBUMIN 2.4 (L) 05/12/2022   BILITOT 0.5 05/12/2022    ALKPHOS 84 05/12/2022   AST 12 (L) 05/12/2022   ALT 8 05/12/2022   ANIONGAP 11 05/15/2022    CBG (last 3)  Recent Labs    05/17/22 0734 05/17/22 1206 05/17/22 1619  GLUCAP 263* 128* 61*       Coagulation Profile: No results for input(s): "INR", "PROTIME" in the last 168 hours.   Radiology Studies: No results found.     Hosie Poisson M.D. [facility] 05/17/2022, 5:15 PM  Available via Epic secure chat 7am-7pm After 7 pm, please refer to night coverage provider listed on amion.

## 2022-05-18 DIAGNOSIS — I10 Essential (primary) hypertension: Secondary | ICD-10-CM | POA: Diagnosis not present

## 2022-05-18 DIAGNOSIS — D72829 Elevated white blood cell count, unspecified: Secondary | ICD-10-CM

## 2022-05-18 DIAGNOSIS — D649 Anemia, unspecified: Secondary | ICD-10-CM | POA: Diagnosis not present

## 2022-05-18 DIAGNOSIS — E1100 Type 2 diabetes mellitus with hyperosmolarity without nonketotic hyperglycemic-hyperosmolar coma (NKHHC): Secondary | ICD-10-CM | POA: Diagnosis not present

## 2022-05-18 DIAGNOSIS — K219 Gastro-esophageal reflux disease without esophagitis: Secondary | ICD-10-CM | POA: Diagnosis not present

## 2022-05-18 LAB — CBC WITH DIFFERENTIAL/PLATELET
Abs Immature Granulocytes: 0.09 10*3/uL — ABNORMAL HIGH (ref 0.00–0.07)
Basophils Absolute: 0.1 10*3/uL (ref 0.0–0.1)
Basophils Relative: 0 %
Eosinophils Absolute: 0.3 10*3/uL (ref 0.0–0.5)
Eosinophils Relative: 2 %
HCT: 22.6 % — ABNORMAL LOW (ref 36.0–46.0)
Hemoglobin: 7.2 g/dL — ABNORMAL LOW (ref 12.0–15.0)
Immature Granulocytes: 1 %
Lymphocytes Relative: 8 %
Lymphs Abs: 1.2 10*3/uL (ref 0.7–4.0)
MCH: 28.3 pg (ref 26.0–34.0)
MCHC: 31.9 g/dL (ref 30.0–36.0)
MCV: 89 fL (ref 80.0–100.0)
Monocytes Absolute: 0.6 10*3/uL (ref 0.1–1.0)
Monocytes Relative: 4 %
Neutro Abs: 12.4 10*3/uL — ABNORMAL HIGH (ref 1.7–7.7)
Neutrophils Relative %: 85 %
Platelets: 532 10*3/uL — ABNORMAL HIGH (ref 150–400)
RBC: 2.54 MIL/uL — ABNORMAL LOW (ref 3.87–5.11)
RDW: 14.6 % (ref 11.5–15.5)
WBC: 14.6 10*3/uL — ABNORMAL HIGH (ref 4.0–10.5)
nRBC: 0 % (ref 0.0–0.2)

## 2022-05-18 LAB — GLUCOSE, CAPILLARY
Glucose-Capillary: 196 mg/dL — ABNORMAL HIGH (ref 70–99)
Glucose-Capillary: 182 mg/dL — ABNORMAL HIGH (ref 70–99)
Glucose-Capillary: 142 mg/dL — ABNORMAL HIGH (ref 70–99)
Glucose-Capillary: 113 mg/dL — ABNORMAL HIGH (ref 70–99)

## 2022-05-18 LAB — BASIC METABOLIC PANEL
Anion gap: 13 (ref 5–15)
BUN: 83 mg/dL — ABNORMAL HIGH (ref 6–20)
CO2: 18 mmol/L — ABNORMAL LOW (ref 22–32)
Calcium: 8.9 mg/dL (ref 8.9–10.3)
Chloride: 97 mmol/L — ABNORMAL LOW (ref 98–111)
Creatinine, Ser: 3.23 mg/dL — ABNORMAL HIGH (ref 0.44–1.00)
GFR, Estimated: 16 mL/min — ABNORMAL LOW (ref >60)
Glucose, Bld: 249 mg/dL — ABNORMAL HIGH (ref 70–99)
Potassium: 4.3 mmol/L (ref 3.5–5.1)
Sodium: 128 mmol/L — ABNORMAL LOW (ref 135–145)

## 2022-05-18 NOTE — Progress Notes (Signed)
Physical Therapy Treatment Patient Details Name: Deborah [name] MRN: [id number] DOB: [birth year] Today's Date: 05/18/2022   History of Present Illness 57 y.o. female with medical history significant of DM2, CKD IV, A.fib    Gastric ulcer, carcinoid tumor of the duodenum, systolic CHF EF A999333, HTN    Admitted for   HHS, leg pain possible sciatica    PT Comments    Pt  reporting severe pain throughout lower extremities and shoulders, states her pain medication this am did not help. Pt requiring light physical assist to transition to EOB, very increased time to perform and once EOB pt declines performing LE exercises or transfer attempt. PT to continue to follow to progress as pt allows.    Recommendations for follow up therapy are one component of a multi-disciplinary discharge planning process, led by the attending physician.  Recommendations may be updated based on patient status, additional functional criteria and insurance authorization.  Follow Up Recommendations  Skilled nursing-short term rehab (<3 hours/day) (However could progress to HHPT if pt progresses) Can patient physically be transported by private vehicle: No   Assistance Recommended at Discharge Frequent or constant Supervision/Assistance  Patient can return home with the following A lot of help with walking and/or transfers;A lot of help with bathing/dressing/bathroom;Assistance with cooking/housework;Assist for transportation;Help with stairs or ramp for entrance   Equipment Recommendations  Other (comment) (bariatric drop arm commode, recommend seating assessment for wheelchair while at SNF)    Recommendations for Other Services       Precautions / Restrictions Precautions Precautions: Fall Restrictions Weight Bearing Restrictions: No     Mobility  Bed Mobility Overal bed mobility: Needs Assistance Bed Mobility: Supine to Sit     Supine to sit: Min assist, HOB elevated     General bed mobility  comments: assist for LE progression to EOB, pt with heavy reliance on UEs, bedrails, and HOB elevation to complete. Very increased time    Transfers                   General transfer comment: pt declines attempt    Ambulation/Gait                   Stairs             Wheelchair Mobility    Modified Rankin (Stroke Patients Only)       Balance Overall balance assessment: Needs assistance Sitting-balance support: Bilateral upper extremity supported, Feet supported Sitting balance-Leahy Scale: Fair         Standing balance comment: declines stand                            Cognition Arousal/Alertness: Awake/alert Behavior During Therapy: WFL for tasks assessed/performed Overall Cognitive Status: Impaired/Different from baseline Area of Impairment: Safety/judgement, Problem solving                         Safety/Judgement: Decreased awareness of deficits, Decreased awareness of safety   Problem Solving: Requires verbal cues, Requires tactile cues General Comments: self-limiting due to pain        Exercises      General Comments        Pertinent Vitals/Pain Pain Assessment Pain Assessment: Faces Faces Pain Scale: Hurts even more Pain Location: bil LEs (hips, knees, ankles), bilat shoulders Pain Descriptors / Indicators: Constant, Sharp Pain Intervention(s): Limited activity within patient's tolerance, Monitored during session, Repositioned,  Other (comment) (voltaren on bilat shoulders)    Home Living                          Prior Function            PT Goals (current goals can now be found in the care plan section) Acute Rehab PT Goals Patient Stated Goal: To get rid of this pain PT Goal Formulation: With patient Time For Goal Achievement: 05/25/22 Potential to Achieve Goals: Fair Progress towards PT goals: Progressing toward goals    Frequency    Min 3X/week      PT Plan Current plan  remains appropriate    Co-evaluation              AM-PAC PT "6 Clicks" Mobility   Outcome Measure  Help needed turning from your back to your side while in a flat bed without using bedrails?: A Little Help needed moving from lying on your back to sitting on the side of a flat bed without using bedrails?: A Little Help needed moving to and from a bed to a chair (including a wheelchair)?: Total Help needed standing up from a chair using your arms (e.g., wheelchair or bedside chair)?: A Lot Help needed to walk in hospital room?: Total Help needed climbing 3-5 steps with a railing? : Total 6 Click Score: 11    End of Session   Activity Tolerance: Patient limited by pain Patient left: in bed;with call bell/phone within reach (sitting EOB - pt refuses bed alarm states it gives her an "anxiety attack", states she will press call button when she wants to return to supine) Nurse Communication: Mobility status PT Visit Diagnosis: Muscle weakness (generalized) (M62.81);Pain Pain - Right/Left: Left Pain - part of body: Shoulder (pt requested voltaren, applied to both shoulders)     Time: [id number] PT Time Calculation (min) (ACUTE ONLY): 14 min  Charges:  $Therapeutic Activity: 8-22 mins                     Stacie Glaze, PT DPT Acute Rehabilitation Services Pager [phone]  Office [phone]    Billiejo Sorto E Ruffin Pyo 05/18/2022, 9:15 AM

## 2022-05-18 NOTE — Progress Notes (Signed)
Deborah [name], is a 57 y.o. female, DOB - Jun 04, 1965, [id number] Admit date - 05/10/2022    Outpatient Primary MD for the patient is [name], [name], MD  LOS - 7  days    Brief summary   Patient is a 57 year old female with past medical history of morbid obesity, spinal stenosis uncontrolled diabetes mellitus type 2 with gastroparesis and stage IV chronic kidney disease, atrial fibrillation, hypertension and carcinoid tumor of the duodenum who presented to the emergency room on the evening of 3/4 with 1 week of abdominal pain, nausea and vomiting.  In the emergency room, patient found to have blood sugar of 540 and nonketotic hyperglycemia.  Patient started on IV fluids and insulin drip.  In addition, she reported to the admitting physician that she was having significant pain in her lower extremities which is now much worse even when she tried to pivot in and out of her wheelchair.  MRI done and reviewed with NS, no surgical intervention at this time. Plan for outpatient follow up .  Therapy eval recommending SNF.   Assessment & Plan    Assessment and Plan: * Hyperosmolar non-ketotic state due to type 2 diabetes mellitus ([place]) Uncontrolled with hyperglycemia.  CBG (last 3)  Recent Labs    05/17/22 1805 05/17/22 2031 05/18/22 0726  GLUCAP 111* 155* 196*   Well controlled CBG'S ,  No changes in the regimen. Continue with semglee 50 units daily and novolog 15 units TIDAC.  A!C IS 10.1.   Nausea and vomiting Appears to have resolved  Spinal stenosis, lumbar region with neurogenic claudication Patient reports that she is wheelchair bound since many months.Worsening pain in the lower extremities more in the right.  MRI of the Lumbar spine ordered and reviewed.  Requested neurosurgery consult, Dr Ronnald Ramp recommended outpatient follow up on discharge, and no surgical  intervention needed at this time.   Leukocytosis No source of infection so far. Procalcitonin 0.12, minimal and lactic acid within normal limits.  Probably reactive. UA negative.  CT chest, abdomen and pelvis done and negative for infection.     PAF (paroxysmal atrial fibrillation) (HCC) Rate controlled with metoprolol 50 mg daily and on Eliquis for anticoagulation  Chronic kidney disease (CKD), stage IV (severe) (HCC) Creatinine at baseline around 3. Creatinine is back to 3.06.   Malignant carcinoid tumor of duodenum [facility]) Outpatient with oncology  Normocytic anemia Secondary to chronic renal disease.  Patient's baseline is around 7.5,.  She had an isolated erroneous lab on night of 3/4 at 9.2.  Hemoglobin later during the hospitalization dropped to 6.9, underwent 1 unit of prbc transfusion ordered. Repeat  post transfusion H&H improved to 8.3,. but hemoglobin continues to trend down gradually and currently around 7.2. continue to monitor.   Anemia panel shows low iron.  No obvious bleeding evident. Stool for occult blood ordered and pending.  She would benefit from outpatient PROCRIT.  Iron supplementation added.   Hypomagnesemia and hypokalemia Replaced. Repeat levels wnl.   Hyponatremia probably secondary to  hyperglycemia Continue to monitor. Sodium has improved from 129 to 131.   GERD (gastroesophageal reflux disease) Continue PPI  Morbid obesity (HCC) Body mass index is 49.56 kg/m. Recommend outpatient follow up with PCp/ bariatric surgery for weight loss.    Left shoulder pain and unable to lift left arm; X rays suspicious for rotator cuff tear.  MRI of the left left shoulder ordered on 2 different occasions.  She has refused it . She does not want any CT's or MRI's at this time. Recommend outpatient follow up with orthopedics. Ambulatory referral on discharge.     Right knee pain and right leg pain:  X rays show severe Severe tricompartmental degenerative  changes, most prominent in the lateral compartment. Physical therapy and pain control.  Venous duplex of the lower extremities ordered and negative.    Therapy eval recommending SNF.  Toc on board.       Bilateral leg edema:  Restarted home dose of torsemide 50 mg BID. But patient says torsemide does not work , so transitioned to 80 mg of lasix TID.    RN Pressure Injury Documentation:    Malnutrition Type:  Nutrition Problem: Inadequate oral intake Etiology: acute illness, decreased appetite   Malnutrition Characteristics:  Signs/Symptoms: per patient/family report   Nutrition Interventions:  Interventions: Boost Breeze, MVI  Estimated body mass index is 49.56 kg/m as calculated from the following:   Height as of this encounter: 5' 5.98" (1.676 m).   Weight as of this encounter: 139.2 kg.  Code Status: full code.  DVT Prophylaxis:   apixaban (ELIQUIS) tablet 5 mg   Level of Care: Level of care: Med-Surg Family Communication: none at bedside.   Disposition Plan:     Remains inpatient appropriate: SNF placement.   Procedures:  MRI lumbar spine.   Consultants:   None.   Antimicrobials:   Anti-infectives (From admission, onward)    None        Medications  Scheduled Meds:  (feeding supplement) PROSource Plus  30 mL Oral BID BM   apixaban  5 mg Oral BID   atorvastatin  10 mg Oral Daily   diclofenac Sodium  4 g Topical QID   DULoxetine  40 mg Oral BID   famotidine  20 mg Oral Daily   feeding supplement  1 Container Oral BID BM   ferrous sulfate  325 mg Oral BID WC   furosemide  80 mg Oral TID   insulin aspart  0-20 Units Subcutaneous TID WC   insulin aspart  0-5 Units Subcutaneous QHS   insulin aspart  15 Units Subcutaneous TID WC   insulin glargine-yfgn  50 Units Subcutaneous Daily   loratadine  10 mg Oral Daily   metoprolol succinate  50 mg Oral Daily   multivitamin with minerals  1 tablet Oral Daily   pantoprazole  40 mg Oral BID    senna-docusate  1 tablet Oral Daily   sodium bicarbonate  650 mg Oral BID   Continuous Infusions:   PRN Meds:.albuterol, dextrose, diazepam, HYDROmorphone (DILAUDID) injection, HYDROmorphone, melatonin, ondansetron (ZOFRAN) IV, polyethylene glycol    Subjective:   Deborah [name] was seen and examined today.   No new complaints other than chronic pain.   Objective:   Vitals:   05/17/22 1609 05/17/22 1934 05/18/22 0547 05/18/22 0806  BP: 127/70 101/66 (!) 125/51 (!) 112/51  Pulse: 95 87 94 97  Resp: '16 18 16 17  '$ Temp: 98.1 F (36.7 C) 98.4 F (36.9 C) 98.6 F (37  C) 97.7 F (36.5 C)  TempSrc: Oral Oral Oral Oral  SpO2: 100% 100% 100% 96%  Weight:      Height:        Intake/Output Summary (Last 24 hours) at 05/18/2022 0927 Last data filed at 05/18/2022 0500 Gross per 24 hour  Intake 340 ml  Output 1950 ml  Net -1610 ml    Filed Weights   05/11/22 0501 05/12/22 0033 05/13/22 0335  Weight: 133.8 kg (!) 138.9 kg (!) 139.2 kg     Exam  General exam: Appears calm and comfortable  Respiratory system: Clear to auscultation. Respiratory effort normal. Cardiovascular system: S1 & S2 heard, RRR. No JVD, Gastrointestinal system: Abdomen is nondistended, soft and nontender.  Central nervous system: Alert and oriented. No focal neurological deficits. Extremities: bilateral lower extremity edema.  Skin: No rashes,  Psychiatry: Mood & affect appropriate.         Data Reviewed:  I have personally reviewed following labs and imaging studies   CBC Lab Results  Component Value Date   WBC 15.0 (H) 05/15/2022   RBC 2.69 (L) 05/15/2022   HGB 7.6 (L) 05/15/2022   HCT 24.2 (L) 05/15/2022   MCV 90.0 05/15/2022   MCH 28.3 05/15/2022   PLT 492 (H) 05/15/2022   MCHC 31.4 05/15/2022   RDW 14.3 05/15/2022   LYMPHSABS 1.7 05/15/2022   MONOABS 1.3 (H) 05/15/2022   EOSABS 0.4 05/15/2022   BASOSABS 0.1 [id number]     Last metabolic panel Lab Results  Component  Value Date   NA 131 (L) 05/15/2022   K 4.0 05/15/2022   CL 99 05/15/2022   CO2 21 (L) 05/15/2022   BUN 70 (H) 05/15/2022   CREATININE 3.06 (H) 05/15/2022   GLUCOSE 250 (H) 05/15/2022   GFRNONAA 17 (L) 05/15/2022   GFRAA 49 (L) 11/30/2019   CALCIUM 9.3 05/15/2022   PHOS 2.7 05/11/2022   PROT 7.4 05/12/2022   ALBUMIN 2.4 (L) 05/12/2022   BILITOT 0.5 05/12/2022   ALKPHOS 84 05/12/2022   AST 12 (L) 05/12/2022   ALT 8 05/12/2022   ANIONGAP 11 05/15/2022    CBG (last 3)  Recent Labs    05/17/22 1805 05/17/22 2031 05/18/22 0726  GLUCAP 111* 155* 196*       Coagulation Profile: No results for input(s): "INR", "PROTIME" in the last 168 hours.   Radiology Studies: No results found.     Hosie Poisson M.D. [facility] 05/18/2022, 9:27 AM  Available via Epic secure chat 7am-7pm After 7 pm, please refer to night coverage provider listed on amion.

## 2022-05-18 NOTE — Progress Notes (Signed)
OT Cancellation Note  Patient Details Name: Deborah [name] MRN: [id number] DOB: 1965/11/04   Cancelled Treatment:    Reason Eval/Treat Not Completed: Patient receiving pain meds, will try to stop back as schedule allows.    Chirstine Defrain D Shloima Clinch 05/18/2022, 3:27 PM 05/18/2022  RP, OTR/L  Acute Rehabilitation Services  Office:  [phone]

## 2022-05-18 NOTE — TOC Progression Note (Signed)
Transition of Care [facility]) - Progression Note    Patient Details  Name: ARYIANA DEGLER MRN: [id number] Date of Birth: Sep 09, 1965  [facility]) CM/SW Contact  Jinger Neighbors, [place] Phone Number: 05/18/2022, 12:49 PM  Clinical Narrative:     CSW returned call to [employer] regarding pt's pending auth for SNF. CSW confirmed we would like authorization expedited to assist with discharge planning.   Expected Discharge Plan: [place] Barriers to Discharge: Continued Medical Work up  Expected Discharge Plan and Services In-house Referral: NA Discharge Planning Services: CM Consult Post Acute Care Choice: [place] Living arrangements for the past 2 months: Single Family Home                 DME Arranged: Bedside commode DME Agency: [employer] Date DME Agency Contacted: 05/12/22 Time DME Agency Contacted: [phone] Representative spoke with at DME Agency: Erasmo Downer HH Arranged: RN, Disease Management, PT, Social Work [employer] Agency: [place] Date [place]: 05/12/22 Time [place]: [place] Representative spoke with at [place]: [place] Determinants of Health ([place]) Interventions [place]: No Food Insecurity (05/11/2022)  Housing: Low Risk  (05/11/2022)  Transportation Needs: No Transportation Needs (05/11/2022)  Utilities: Not At Risk (05/11/2022)  Depression (PHQ2-9): Medium Risk (09/16/2021)  Tobacco Use: Low Risk  (05/10/2022)    Readmission Risk Interventions     No data to display

## 2022-05-18 NOTE — Progress Notes (Signed)
Nutrition Follow-up  DOCUMENTATION CODES:   Morbid obesity  INTERVENTION:  - Discontinue Boost Breeze and Prosource Plus supplements.   NUTRITION DIAGNOSIS:   Inadequate oral intake related to acute illness, decreased appetite as evidenced by per patient/family report. - Progressing   GOAL:   Patient will meet greater than or equal to 90% of their needs - Ongoing  MONITOR:   Supplement acceptance, Diet advancement, Labs, Weight trends  REASON FOR ASSESSMENT:   Consult Assessment of nutrition requirement/status  ASSESSMENT:   Pt admitted with n/v and abdominal pain with admission 1/10-1/15 with similar presentation. PMH significant for T2DM, gastroparesis, CKD IV, afib, gastric ulcer, carcinoid tumor of the duodenum, systolic CHF EF [id number], HTN.  Meds reviewed:  Lipitor, pepcid, ferrous sulfate, lasix, sliding scale insulin, semglee (50 units), MVI, senokot, sodium bicarbonate.   Pt reports that her intakes are improving. Per record, pt has eaten mostly 50-100% of her meals. She states that she no longer wants the Boost Breeze or Prosource Plus supplements. RD will discontinue supplements and continue to monitor PO intakes.   Diet Order:   Diet Order             Diet Carb Modified Fluid consistency: Thin; Room service appropriate? Yes  Diet effective now                   EDUCATION NEEDS:   No education needs have been identified at this time  Skin:  Skin Assessment: Reviewed RN Assessment  Last BM:  3/10  Height:   Ht Readings from Last 1 Encounters:  05/11/22 5' 5.98" (1.676 m)    Weight:   Wt Readings from Last 1 Encounters:  05/13/22 (!) 139.2 kg    Ideal Body Weight:  59.1 kg  BMI:  Body mass index is 49.56 kg/m.  Estimated Nutritional Needs:   Kcal:  1500-1700  Protein:  80-95g  Fluid:  >/=1.5L  Deborah [name], RD, LDN, CNSC.

## 2022-05-19 DIAGNOSIS — E1100 Type 2 diabetes mellitus with hyperosmolarity without nonketotic hyperglycemic-hyperosmolar coma (NKHHC): Secondary | ICD-10-CM | POA: Diagnosis not present

## 2022-05-19 LAB — CBC
HCT: 20.4 % — ABNORMAL LOW (ref 36.0–46.0)
HCT: 23.9 % — ABNORMAL LOW (ref 36.0–46.0)
Hemoglobin: 6.6 g/dL — CL (ref 12.0–15.0)
Hemoglobin: 7.7 g/dL — ABNORMAL LOW (ref 12.0–15.0)
MCH: 28.3 pg (ref 26.0–34.0)
MCH: 27.1 pg (ref 26.0–34.0)
MCHC: 32.4 g/dL (ref 30.0–36.0)
MCHC: 32.2 g/dL (ref 30.0–36.0)
MCV: 87.6 fL (ref 80.0–100.0)
MCV: 84.2 fL (ref 80.0–100.0)
Platelets: 500 10*3/uL — ABNORMAL HIGH (ref 150–400)
Platelets: 535 10*3/uL — ABNORMAL HIGH (ref 150–400)
RBC: 2.33 MIL/uL — ABNORMAL LOW (ref 3.87–5.11)
RBC: 2.84 MIL/uL — ABNORMAL LOW (ref 3.87–5.11)
RDW: 14.9 % (ref 11.5–15.5)
RDW: 17.8 % — ABNORMAL HIGH (ref 11.5–15.5)
WBC: 14.4 10*3/uL — ABNORMAL HIGH (ref 4.0–10.5)
WBC: 15.1 10*3/uL — ABNORMAL HIGH (ref 4.0–10.5)
nRBC: 0 % (ref 0.0–0.2)
nRBC: 0 % (ref 0.0–0.2)

## 2022-05-19 LAB — BASIC METABOLIC PANEL
Anion gap: 12 (ref 5–15)
BUN: 83 mg/dL — ABNORMAL HIGH (ref 6–20)
CO2: 22 mmol/L (ref 22–32)
Calcium: 8.8 mg/dL — ABNORMAL LOW (ref 8.9–10.3)
Chloride: 97 mmol/L — ABNORMAL LOW (ref 98–111)
Creatinine, Ser: 3.23 mg/dL — ABNORMAL HIGH (ref 0.44–1.00)
GFR, Estimated: 16 mL/min — ABNORMAL LOW (ref >60)
Glucose, Bld: 121 mg/dL — ABNORMAL HIGH (ref 70–99)
Potassium: 3.9 mmol/L (ref 3.5–5.1)
Sodium: 131 mmol/L — ABNORMAL LOW (ref 135–145)

## 2022-05-19 LAB — GLUCOSE, CAPILLARY
Glucose-Capillary: 126 mg/dL — ABNORMAL HIGH (ref 70–99)
Glucose-Capillary: 65 mg/dL — ABNORMAL LOW (ref 70–99)
Glucose-Capillary: 76 mg/dL (ref 70–99)
Glucose-Capillary: 104 mg/dL — ABNORMAL HIGH (ref 70–99)
Glucose-Capillary: 109 mg/dL — ABNORMAL HIGH (ref 70–99)

## 2022-05-19 LAB — PREPARE RBC (CROSSMATCH)

## 2022-05-19 MED ORDER — SENNOSIDES-DOCUSATE SODIUM 8.6-50 MG PO TABS
2.0000 | ORAL_TABLET | Freq: Two times a day (BID) | ORAL | Status: DC
  Administered 2022-05-19 – 2022-05-23 (×6): 2 via ORAL
  Filled 2022-05-19 (×9): qty 2

## 2022-05-19 MED ORDER — SODIUM CHLORIDE 0.9% IV SOLUTION: Freq: Once | INTRAVENOUS | Status: AC

## 2022-05-19 MED ORDER — POLYETHYLENE GLYCOL 3350 17 G PO PACK
17.0000 g | PACK | Freq: Every day | ORAL | Status: DC
  Administered 2022-05-21 – 2022-05-23 (×2): 17 g via ORAL
  Filled 2022-05-19 (×4): qty 1

## 2022-05-19 NOTE — Progress Notes (Signed)
Hypoglycemic Event  CBG: 65  Treatment: 4 oz juice/soda  Symptoms: None  Follow-up CBG: Time:1415 CBG Result:76  Possible Reasons for Event:   Comments/MD notified:yes    Temitope Griffing Anastasia Fiedler [place]

## 2022-05-19 NOTE — Progress Notes (Signed)
PROGRESS NOTE  Deborah [name] [id number] DOB: 1965-09-16 DOA: 05/10/2022 PCP: Cena Benton, MD   LOS: 8 days   Brief Narrative / Interim history: 57 year old female with history of morbid obesity, spinal stenosis, uncontrolled DM2 with gastroparesis, CKD stage IV, PAF, HTN, duodenal carcinoid tumor who came into the hospital with abdominal pain, nausea, vomiting.  She was found to have a blood sugar of 540 nonketotic hyperglycemia.  She was started on insulin and admitted to the hospital.  She also reported worsening lower extremity and back pain.  Subjective / 24h Interval events: Feels sleepy this morning, no significant complaints at rest.  Assesement and Plan: Principal Problem:   Hyperosmolar non-ketotic state due to type 2 diabetes mellitus ([place]) Active Problems:   Spinal stenosis, lumbar region with neurogenic claudication   Leukocytosis   PAF (paroxysmal atrial fibrillation) (HCC)   Chronic kidney disease (CKD), stage IV (severe) (HCC)   Essential hypertension, benign   Malignant carcinoid tumor of duodenum (HCC)   Normocytic anemia   Hypomagnesemia and hypokalemia   GERD (gastroesophageal reflux disease)   Morbid obesity ([place])  Principal problem Hyperosmolar nonketotic state due to poorly controlled DM2, with hyperglycemia -fairly well-controlled now on glargine as well as scheduled insulin plus sliding scale.  Continue regimen as below.  A1c was 10  CBG (last 3)  Recent Labs    05/18/22 1541 05/18/22 2025 05/19/22 0811  GLUCAP 142* 113* 126*   Active problems Spinal stenosis, lumbar region with neurogenic claudication -she has been wheelchair-bound for several months, now has worsening pain in the lower extremities more so on the right.  Lumbar spine MRI was done on 3/6 which showed lower lumbar spondylosis worsening since 2019, worst at L4-5, with facet arthropathy with joint effusion and periarticular edema and unchanged severe right  neuroforaminal narrowing at L5-S1.  Neurosurgery evaluated patient and did not recommend surgical approach at this point but continued PT, outpatient follow-up  Abdominal pain, nausea, vomiting -intermittent in the setting of gastroparesis, now appears resolved  PAF-continue Eliquis, metoprolol  CKD, stage IV-baseline around 3  Duodenal malignant carcinoid tumor-outpatient follow-up with oncology  Normocytic anemia, iron deficiency anemia-likely secondary to chronic kidney disease, she did require unit of packed red blood cells upon admission and hemoglobin again dropping less than 7 this morning.  No obvious bleeding, fecal occult pending.  Transfuse additional unit of packed red blood cells.  Continue iron  Leukocytosis-no clear-cut source of infection, monitor off antibiotics, she is afebrile.  Procalcitonin was low at 0.12.  UA negative, CT chest, abdomen, pelvis negative for apparent infections   Scheduled Meds:  sodium chloride   Intravenous Once   apixaban  5 mg Oral BID   atorvastatin  10 mg Oral Daily   diclofenac Sodium  4 g Topical QID   DULoxetine  40 mg Oral BID   famotidine  20 mg Oral Daily   ferrous sulfate  325 mg Oral BID WC   furosemide  80 mg Oral TID   insulin aspart  0-20 Units Subcutaneous TID WC   insulin aspart  0-5 Units Subcutaneous QHS   insulin aspart  15 Units Subcutaneous TID WC   insulin glargine-yfgn  50 Units Subcutaneous Daily   loratadine  10 mg Oral Daily   metoprolol succinate  50 mg Oral Daily   multivitamin with minerals  1 tablet Oral Daily   pantoprazole  40 mg Oral BID   senna-docusate  1 tablet Oral Daily   sodium bicarbonate  650  mg Oral BID   Continuous Infusions: PRN Meds:.albuterol, dextrose, diazepam, HYDROmorphone, melatonin, ondansetron (ZOFRAN) IV, polyethylene glycol  Current Outpatient Medications  Medication Instructions   albuterol (PROVENTIL) 2.5 mg, Nebulization, Every 6 hours PRN   apixaban (ELIQUIS) 5 mg, Oral, 2  times daily   atorvastatin (LIPITOR) 10 mg, Oral, Daily   cetirizine (ZYRTEC) 10 MG tablet TAKE 1 TABLET (10 MG TOTAL) BY MOUTH DAILY (AM)   DULoxetine HCl 40 mg, Oral, 2 times daily   EASY COMFORT PEN NEEDLES 31G X 5 MM MISC USE 3 TIMES A DAY FOR INSULIN ADMINISTRATION   famotidine (PEPCID) 20 mg, Oral, Daily   fluticasone (FLONASE) 50 MCG/ACT nasal spray 2 sprays, Each Nare, Daily PRN   folic acid (FOLVITE) 1 mg, Oral, Daily   HumaLOG KwikPen 7 Units, Subcutaneous, 3 times daily with meals   hydrALAZINE (APRESOLINE) 25 mg, Oral, 3 times daily   HYDROmorphone (DILAUDID) 4 MG tablet TAKE 1 TABLET (4 MG TOTAL) BY MOUTH 2 (TWO) TIMES DAILY. MAY TAKE AN EXTRA TABLET WHEN PAIN IS SEVERE 20 DAYS OUT OF THE MONTH  FILL ON 04/02/2022    isosorbide mononitrate (IMDUR) 15 mg, Oral, Daily   Lantus SoloStar 35 Units, Subcutaneous, Daily at bedtime   magnesium chloride (SLOW-MAG) 64 MG TBEC SR tablet 1 tablet, Oral, Daily   metoprolol succinate (TOPROL-XL) 50 mg, Oral, Daily, Take with or immediately following a meal.   ondansetron (ZOFRAN ODT) 4 mg, Oral, Every 8 hours PRN   pantoprazole (PROTONIX) 40 MG tablet TAKE 1 TABLET BY MOUTH 2 (TWO) TIMES DAILY. (AM+BEDTIME)   SENNA PLUS 8.6-50 MG tablet 1 tablet, Daily   sodium bicarbonate 650 mg, Oral, 2 times daily   torsemide (DEMADEX) 50 mg, Oral, 2 times daily   Vitamin D (Ergocalciferol) (DRISDOL) 50,000 Units, Oral, Every Mon    Diet Orders (From admission, onward)     Start     Ordered   05/11/22 1110  Diet Carb Modified Fluid consistency: Thin; Room service appropriate? Yes  Diet effective now       Question Answer Comment  Diet-HS Snack? Nothing   Calorie Level Medium 1600-2000   Fluid consistency: Thin   Room service appropriate? Yes      05/11/22 1110            DVT prophylaxis:  apixaban (ELIQUIS) tablet 5 mg   Lab Results  Component Value Date   PLT 500 (H) 05/19/2022      Code Status: Full Code  Family Communication:  no family at bedside   Status is: Inpatient  Remains inpatient appropriate because: severity of illness  Level of care: Med-Surg  Consultants:  Neurosurgery   Objective: Vitals:   05/18/22 1540 05/18/22 2018 05/19/22 0419 05/19/22 0753  BP: (!) 117/58 105/80 (!) 110/57 124/63  Pulse: 89 93 88 91  Resp: '18 17 17   '$ Temp: 98.5 F (36.9 C) 97.9 F (36.6 C) 97.8 F (36.6 C) 97.6 F (36.4 C)  TempSrc: Oral Oral Oral Oral  SpO2: 100% 100% 100% 100%  Weight:      Height:        Intake/Output Summary (Last 24 hours) at 05/19/2022 0955 Last data filed at 05/19/2022 0420 Gross per 24 hour  Intake --  Output 2050 ml  Net -2050 ml   Wt Readings from Last 3 Encounters:  05/13/22 (!) 139.2 kg  03/20/22 115.2 kg  08/27/21 132.8 kg    Examination:  Constitutional: NAD Eyes: no scleral icterus ENMT: Mucous  membranes are moist.  Neck: normal, supple Respiratory: clear to auscultation bilaterally, no wheezing, no crackles. Normal respiratory effort. No accessory muscle use.  Cardiovascular: Regular rate and rhythm, no murmurs / rubs / gallops. No LE edema.  Abdomen: non distended, no tenderness. Bowel sounds positive.  Musculoskeletal: no clubbing / cyanosis.    Data Reviewed: I have independently reviewed following labs and imaging studies   CBC Recent Labs  Lab 05/13/22 0834 05/13/22 1812 05/14/22 1028 05/15/22 1802 05/18/22 0910 05/19/22 0657  WBC 13.2*  --  18.6* 15.0* 14.6* 14.4*  HGB 6.9* 7.6* 8.3* 7.6* 7.2* 6.6*  HCT 21.9* 24.6* 26.6* 24.2* 22.6* 20.4*  PLT 469*  --  425* 492* 532* 500*  MCV 90.9  --  90.8 90.0 89.0 87.6  MCH 28.6  --  28.3 28.3 28.3 28.3  MCHC 31.5  --  31.2 31.4 31.9 32.4  RDW 13.6  --  14.5 14.3 14.6 14.9  LYMPHSABS 2.3  --  1.8 1.7 1.2  --   MONOABS 0.7  --  1.1* 1.3* 0.6  --   EOSABS 0.3  --  0.3 0.4 0.3  --   BASOSABS 0.1  --  0.1 0.1 0.1  --     Recent Labs  Lab 05/13/22 0834 05/14/22 1028 05/15/22 1802 05/18/22 0910  05/19/22 0657  NA 132* 129* 131* 128* 131*  K 3.7 4.4 4.0 4.3 3.9  CL 103 101 99 97* 97*  CO2 19* 15* 21* 18* 22  GLUCOSE 402* 321* 250* 249* 121*  BUN 63* 70* 70* 83* 83*  CREATININE 3.04* 3.35* 3.06* 3.23* 3.23*  CALCIUM 9.1 9.2 9.3 8.9 8.8*  PROCALCITON  --  0.12  --   --   --     ------------------------------------------------------------------------------------------------------------------ No results for input(s): "CHOL", "HDL", "LDLCALC", "TRIG", "CHOLHDL", "LDLDIRECT" in the last 72 hours.  Lab Results  Component Value Date   HGBA1C 10.1 (H) 03/21/2022   ------------------------------------------------------------------------------------------------------------------ No results for input(s): "TSH", "T4TOTAL", "T3FREE", "THYROIDAB" in the last 72 hours.  Invalid input(s): "FREET3"  Cardiac Enzymes No results for input(s): "CKMB", "TROPONINI", "MYOGLOBIN" in the last 168 hours.  Invalid input(s): "CK" ------------------------------------------------------------------------------------------------------------------    Component Value Date/Time   BNP 24.5 05/10/2022 2038    CBG: Recent Labs  Lab 05/18/22 0726 05/18/22 1139 05/18/22 1541 05/18/22 2025 05/19/22 0811  GLUCAP 196* 182* 142* 113* 126*    Recent Results (from the past 240 hour(s))  Resp panel by RT-PCR (RSV, Flu A&B, Covid) Anterior Nasal Swab     Status: None   Collection Time: 05/10/22  7:21 PM   Specimen: Anterior Nasal Swab  Result Value Ref Range Status   SARS Coronavirus 2 by RT PCR NEGATIVE NEGATIVE Final   Influenza A by PCR NEGATIVE NEGATIVE Final   Influenza B by PCR NEGATIVE NEGATIVE Final    Comment: (NOTE) The Xpert Xpress SARS-CoV-2/FLU/RSV plus assay is intended as an aid in the diagnosis of influenza from Nasopharyngeal swab specimens and should not be used as a sole basis for treatment. Nasal washings and aspirates are unacceptable for Xpert Xpress  SARS-CoV-2/FLU/RSV testing.  Fact Sheet for Patients: EntrepreneurPulse.com.au  Fact Sheet for Healthcare Providers: IncredibleEmployment.be  This test is not yet approved or cleared by the [place] FDA and has been authorized for detection and/or diagnosis of SARS-CoV-2 by FDA under an Emergency Use Authorization (EUA). This EUA will remain in effect (meaning this test can be used) for the duration of the COVID-19 declaration under Section 564(b)(1)  of the Act, 21 U.S.C. section 360bbb-3(b)(1), unless the authorization is terminated or revoked.     Resp Syncytial Virus by PCR NEGATIVE NEGATIVE Final    Comment: (NOTE) Fact Sheet for Patients: EntrepreneurPulse.com.au  Fact Sheet for Healthcare Providers: IncredibleEmployment.be  This test is not yet approved or cleared by the [place] FDA and has been authorized for detection and/or diagnosis of SARS-CoV-2 by FDA under an Emergency Use Authorization (EUA). This EUA will remain in effect (meaning this test can be used) for the duration of the COVID-19 declaration under Section 564(b)(1) of the Act, 21 U.S.C. section 360bbb-3(b)(1), unless the authorization is terminated or revoked.  Performed at [facility], [place] [address], [place], [place] [address]      Radiology Studies: No results found.   Marzetta Board, MD, PhD [facility]  Between 7 am - 7 pm I am available, please contact me via Amion (for emergencies) or Securechat (non urgent messages)  Between 7 pm - 7 am I am not available, please contact night coverage MD/APP via Amion

## 2022-05-19 NOTE — Inpatient Diabetes Management (Signed)
Inpatient Diabetes Program Recommendations  AACE/ADA: New Consensus Statement on Inpatient Glycemic Control (2015)  Target Ranges:  Prepandial:   less than 140 mg/dL      Peak postprandial:   less than 180 mg/dL (1-2 hours)      Critically ill patients:  140 - 180 mg/dL   Lab Results  Component Value Date   GLUCAP 76 05/19/2022   HGBA1C 10.1 (H) 03/21/2022    Review of Glycemic Control  Latest Reference Range & Units 05/19/22 08:11 05/19/22 13:25 05/19/22 14:23  Glucose-Capillary 70 - 99 mg/dL 126 (H) 65 (L) 76  (H): Data is abnormally high (L): Data is abnormally low  Diabetes history: DM2 Outpatient Diabetes medications:  Semglee 50 units Daily Novolog Resistant Correction Scale/ SSI (0-20 units) TID AC + HS Novolog 10 units TID with meals Current orders for Inpatient glycemic control:  Semglee 50 units QD Novolog 0-20 units TID and 0-5 units QHS Novolog 15 units TID  Inpatient Diabetes Program Recommendations:    Please consider: Novolog 12 units TID with meals if she consumes at least 50%  Will continue to follow while inpatient.  Thank you, Reche Dixon, MSN, [place] Diabetes Coordinator Inpatient Diabetes Program [phone] (team pager from 8a-5p)

## 2022-05-19 NOTE — Progress Notes (Signed)
OT Cancellation Note  Patient Details Name: Deborah [name] MRN: [id number] DOB: 1965/10/14   Cancelled Treatment:    Reason Eval/Treat Not Completed: Patient declined, no reason specified (Patient asked for COTA to return after she has had her pain meds, will attempt again later as schedule permits) Lodema Hong, [place] 05/19/2022, 9:42 AM

## 2022-05-19 NOTE — Progress Notes (Signed)
Order is there to stool for occult blood. Patient made aware. She has not had bowel movement since yesterday. Refused to have stool softners per order.

## 2022-05-19 NOTE — Progress Notes (Signed)
Received a phone call for critical lab results for HB 6.6 at 0713 AM. MD made aware via secure chat at [place] AM.

## 2022-05-19 NOTE — Progress Notes (Signed)
Blood transfusion is completed at 1415 PM. Vital signs are stable.  No transfusion reaction noted.

## 2022-05-20 DIAGNOSIS — E1100 Type 2 diabetes mellitus with hyperosmolarity without nonketotic hyperglycemic-hyperosmolar coma (NKHHC): Secondary | ICD-10-CM | POA: Diagnosis not present

## 2022-05-20 LAB — GLUCOSE, CAPILLARY
Glucose-Capillary: 117 mg/dL — ABNORMAL HIGH (ref 70–99)
Glucose-Capillary: 134 mg/dL — ABNORMAL HIGH (ref 70–99)
Glucose-Capillary: 150 mg/dL — ABNORMAL HIGH (ref 70–99)
Glucose-Capillary: 190 mg/dL — ABNORMAL HIGH (ref 70–99)

## 2022-05-20 MED ORDER — INSULIN ASPART 100 UNIT/ML IJ SOLN
8.0000 [IU] | Freq: Three times a day (TID) | INTRAMUSCULAR | Status: DC
  Administered 2022-05-21 – 2022-05-27 (×14): 8 [IU] via SUBCUTANEOUS

## 2022-05-20 MED ORDER — MORPHINE SULFATE ER 15 MG PO TBCR
15.0000 mg | EXTENDED_RELEASE_TABLET | Freq: Two times a day (BID) | ORAL | Status: DC
  Administered 2022-05-20 – 2022-05-27 (×14): 15 mg via ORAL
  Filled 2022-05-20 (×15): qty 1

## 2022-05-20 MED ORDER — INSULIN GLARGINE-YFGN 100 UNIT/ML ~~LOC~~ SOLN
40.0000 [IU] | Freq: Every day | SUBCUTANEOUS | Status: DC
  Administered 2022-05-20 – 2022-05-23 (×4): 40 [IU] via SUBCUTANEOUS
  Filled 2022-05-20 (×4): qty 0.4

## 2022-05-20 MED ORDER — LIDOCAINE 5 % EX PTCH: 1.0000 | MEDICATED_PATCH | CUTANEOUS | Status: DC

## 2022-05-20 NOTE — Plan of Care (Signed)
  Problem: Education: Goal: Knowledge of General Education information will improve Description: Including pain rating scale, medication(s)/side effects and non-pharmacologic comfort measures Outcome: Progressing   Problem: Health Behavior/Discharge Planning: Goal: Ability to manage health-related needs will improve Outcome: Progressing   Problem: Activity: Goal: Risk for activity intolerance will decrease Outcome: Progressing   Problem: Nutrition: Goal: Adequate nutrition will be maintained Outcome: Progressing   

## 2022-05-20 NOTE — TOC Progression Note (Signed)
Transition of Care [facility]) - Progression Note    Patient Details  Name: Deborah [name] MRN: [id number] Date of Birth: Jul 23, 1965  [facility] CM/SW Contact  Jinger Neighbors, [place] Phone Number: 05/20/2022, 9:35 AM  Clinical Narrative:     CSW alerted attending MD to peer to peer for insurance auth for SNF. Insurance denied SNF auth stating pain should be better controlled before d/c. TOC will continue to follow   Expected Discharge Plan: [place] Barriers to Discharge: Continued Medical Work up  Expected Discharge Plan and Services In-house Referral: NA Discharge Planning Services: CM Consult Post Acute Care Choice: [place] Living arrangements for the past 2 months: Single Family Home                 DME Arranged: Bedside commode DME Agency: [employer] Date DME Agency Contacted: 05/12/22 Time DME Agency Contacted: [phone] Representative spoke with at DME Agency: Erasmo Downer HH Arranged: RN, Disease Management, PT, Social Work [employer] Agency: [place] Date [place]: 05/12/22 Time [place]: [place] Representative spoke with at [place]: [place] Determinants of Health ([place]) Interventions [place]: No Food Insecurity (05/11/2022)  Housing: Low Risk  (05/11/2022)  Transportation Needs: No Transportation Needs (05/11/2022)  Utilities: Not At Risk (05/11/2022)  Depression (PHQ2-9): Medium Risk (09/16/2021)  Tobacco Use: Low Risk  (05/10/2022)    Readmission Risk Interventions     No data to display

## 2022-05-20 NOTE — Progress Notes (Signed)
Patient refused to have food order. Patient's mother was at bed side and she tried to order for food but patient was still kept on refusing.  And patient refused to have insulin and pills in the evening. Patient was educated on importance of having medicine but refused.  Charge nurse made aware.

## 2022-05-20 NOTE — Progress Notes (Signed)
PROGRESS NOTE  Deborah [name] [id number] DOB: August 11, 1965 DOA: 05/10/2022 PCP: Cena Benton, MD   LOS: 9 days   Brief Narrative / Interim history: 57 year old female with history of morbid obesity, spinal stenosis, uncontrolled DM2 with gastroparesis, CKD stage IV, PAF, HTN, duodenal carcinoid tumor who came into the hospital with abdominal pain, nausea, vomiting.  She was found to have a blood sugar of 540 nonketotic hyperglycemia.  She was started on insulin and admitted to the hospital.  She also reported worsening lower extremity and back pain.  Subjective / 24h Interval events: Continues to complain of back pain.  Assesement and Plan: Principal Problem:   Hyperosmolar non-ketotic state due to type 2 diabetes mellitus ([place]) Active Problems:   Spinal stenosis, lumbar region with neurogenic claudication   Leukocytosis   PAF (paroxysmal atrial fibrillation) (HCC)   Chronic kidney disease (CKD), stage IV (severe) (HCC)   Essential hypertension, benign   Malignant carcinoid tumor of duodenum (HCC)   Normocytic anemia   Hypomagnesemia and hypokalemia   GERD (gastroesophageal reflux disease)   Morbid obesity ([place])  Principal problem Hyperosmolar nonketotic state due to poorly controlled DM2, with hyperglycemia -fairly well-controlled now on glargine as well as scheduled insulin plus sliding scale.  Continue regimen as below.  A1c was 10  CBG (last 3)  Recent Labs    05/19/22 1708 05/19/22 2138 05/20/22 0748  GLUCAP 104* 109* 117*    Active problems Spinal stenosis, lumbar region with neurogenic claudication -she has been wheelchair-bound for several months, now has worsening pain in the lower extremities more so on the right.  Lumbar spine MRI was done on 3/6 which showed lower lumbar spondylosis worsening since 2019, worst at L4-5, with facet arthropathy with joint effusion and periarticular edema and unchanged severe right neuroforaminal narrowing at L5-S1.   Neurosurgery evaluated patient and did not recommend surgical approach at this point but continued PT, outpatient follow-up -Pain is now controlled with oral agents, she refused lidocaine patch due to the fact that this is never worked for, I will start long-acting MS Contin today in addition to her oral Dilaudid.  For further pain control she does not need to stay in the hospital  Abdominal pain, nausea, vomiting -intermittent in the setting of gastroparesis, now appears resolved  PAF-continue Eliquis, metoprolol  CKD, stage IV-baseline around 3, currently at baseline.  Continue furosemide  Duodenal malignant carcinoid tumor-outpatient follow-up with oncology  Normocytic anemia, iron deficiency anemia, anemia of CKD-likely secondary to chronic kidney disease, status post 2 units of red blood cells.  No obvious bleeding and fecal occult is pending  Leukocytosis-no clear-cut source of infection, monitor off antibiotics, she is afebrile.  Procalcitonin was low at 0.12.  UA negative, CT chest, abdomen, pelvis negative for apparent infections   Scheduled Meds:  apixaban  5 mg Oral BID   atorvastatin  10 mg Oral Daily   diclofenac Sodium  4 g Topical QID   DULoxetine  40 mg Oral BID   famotidine  20 mg Oral Daily   ferrous sulfate  325 mg Oral BID WC   furosemide  80 mg Oral TID   insulin aspart  0-20 Units Subcutaneous TID WC   insulin aspart  0-5 Units Subcutaneous QHS   insulin aspart  8 Units Subcutaneous TID WC   insulin glargine-yfgn  40 Units Subcutaneous Daily   loratadine  10 mg Oral Daily   metoprolol succinate  50 mg Oral Daily   morphine  15 mg Oral  Q12H   multivitamin with minerals  1 tablet Oral Daily   pantoprazole  40 mg Oral BID   polyethylene glycol  17 g Oral Daily   senna-docusate  2 tablet Oral BID   sodium bicarbonate  650 mg Oral BID   Continuous Infusions: PRN Meds:.albuterol, dextrose, diazepam, HYDROmorphone, melatonin, ondansetron (ZOFRAN) IV  Current  Outpatient Medications  Medication Instructions   albuterol (PROVENTIL) 2.5 mg, Nebulization, Every 6 hours PRN   apixaban (ELIQUIS) 5 mg, Oral, 2 times daily   atorvastatin (LIPITOR) 10 mg, Oral, Daily   cetirizine (ZYRTEC) 10 MG tablet TAKE 1 TABLET (10 MG TOTAL) BY MOUTH DAILY (AM)   DULoxetine HCl 40 mg, Oral, 2 times daily   EASY COMFORT PEN NEEDLES 31G X 5 MM MISC USE 3 TIMES A DAY FOR INSULIN ADMINISTRATION   famotidine (PEPCID) 20 mg, Oral, Daily   fluticasone (FLONASE) 50 MCG/ACT nasal spray 2 sprays, Each Nare, Daily PRN   folic acid (FOLVITE) 1 mg, Oral, Daily   HumaLOG KwikPen 7 Units, Subcutaneous, 3 times daily with meals   hydrALAZINE (APRESOLINE) 25 mg, Oral, 3 times daily   HYDROmorphone (DILAUDID) 4 MG tablet TAKE 1 TABLET (4 MG TOTAL) BY MOUTH 2 (TWO) TIMES DAILY. MAY TAKE AN EXTRA TABLET WHEN PAIN IS SEVERE 20 DAYS OUT OF THE MONTH  FILL ON 04/02/2022    isosorbide mononitrate (IMDUR) 15 mg, Oral, Daily   Lantus SoloStar 35 Units, Subcutaneous, Daily at bedtime   magnesium chloride (SLOW-MAG) 64 MG TBEC SR tablet 1 tablet, Oral, Daily   metoprolol succinate (TOPROL-XL) 50 mg, Oral, Daily, Take with or immediately following a meal.   ondansetron (ZOFRAN ODT) 4 mg, Oral, Every 8 hours PRN   pantoprazole (PROTONIX) 40 MG tablet TAKE 1 TABLET BY MOUTH 2 (TWO) TIMES DAILY. (AM+BEDTIME)   SENNA PLUS 8.6-50 MG tablet 1 tablet, Daily   sodium bicarbonate 650 mg, Oral, 2 times daily   torsemide (DEMADEX) 50 mg, Oral, 2 times daily   Vitamin D (Ergocalciferol) (DRISDOL) 50,000 Units, Oral, Every Mon    Diet Orders (From admission, onward)     Start     Ordered   05/11/22 1110  Diet Carb Modified Fluid consistency: Thin; Room service appropriate? Yes  Diet effective now       Question Answer Comment  Diet-HS Snack? Nothing   Calorie Level Medium 1600-2000   Fluid consistency: Thin   Room service appropriate? Yes      05/11/22 1110            DVT prophylaxis:   apixaban (ELIQUIS) tablet 5 mg   Lab Results  Component Value Date   PLT 535 (H) 05/19/2022      Code Status: Full Code  Family Communication: no family at bedside   Status is: Inpatient  Remains inpatient appropriate because: severity of illness  Level of care: Med-Surg  Consultants:  Neurosurgery   Objective: Vitals:   05/19/22 1650 05/19/22 2008 05/20/22 0449 05/20/22 0747  BP: 125/76 125/62 (!) 114/58 127/71  Pulse: 98 97 99 98  Resp: '16 17 17 17  '$ Temp: 98.2 F (36.8 C) 97.7 F (36.5 C) 98.6 F (37 C) 98.3 F (36.8 C)  TempSrc: Oral Oral  Oral  SpO2: 100% 98% 96% 99%  Weight:      Height:        Intake/Output Summary (Last 24 hours) at 05/20/2022 1139 Last data filed at 05/20/2022 [id number] Gross per 24 hour  Intake 230 ml  Output  500 ml  Net -270 ml    Wt Readings from Last 3 Encounters:  05/13/22 (!) 139.2 kg  03/20/22 115.2 kg  08/27/21 132.8 kg    Examination:  Constitutional: NAD Eyes: lids and conjunctivae normal, no scleral icterus ENMT: mmm Neck: normal, supple Respiratory: clear to auscultation bilaterally, no wheezing, no crackles. Normal respiratory effort.  Cardiovascular: Regular rate and rhythm, no murmurs / rubs / gallops. Trace edema Abdomen: soft, no distention, no tenderness. Bowel sounds positive.    Data Reviewed: I have independently reviewed following labs and imaging studies   CBC Recent Labs  Lab 05/14/22 1028 05/15/22 1802 05/18/22 0910 05/19/22 0657 05/19/22 2035  WBC 18.6* 15.0* 14.6* 14.4* 15.1*  HGB 8.3* 7.6* 7.2* 6.6* 7.7*  HCT 26.6* 24.2* 22.6* 20.4* 23.9*  PLT 425* 492* 532* 500* 535*  MCV 90.8 90.0 89.0 87.6 84.2  MCH 28.3 28.3 28.3 28.3 27.1  MCHC 31.2 31.4 31.9 32.4 32.2  RDW 14.5 14.3 14.6 14.9 17.8*  LYMPHSABS 1.8 1.7 1.2  --   --   MONOABS 1.1* 1.3* 0.6  --   --   EOSABS 0.3 0.4 0.3  --   --   BASOSABS 0.1 0.1 0.1  --   --      Recent Labs  Lab 05/14/22 1028 05/15/22 1802 05/18/22 0910  05/19/22 0657  NA 129* 131* 128* 131*  K 4.4 4.0 4.3 3.9  CL 101 99 97* 97*  CO2 15* 21* 18* 22  GLUCOSE 321* 250* 249* 121*  BUN 70* 70* 83* 83*  CREATININE 3.35* 3.06* 3.23* 3.23*  CALCIUM 9.2 9.3 8.9 8.8*  PROCALCITON 0.12  --   --   --      ------------------------------------------------------------------------------------------------------------------ No results for input(s): "CHOL", "HDL", "LDLCALC", "TRIG", "CHOLHDL", "LDLDIRECT" in the last 72 hours.  Lab Results  Component Value Date   HGBA1C 10.1 (H) 03/21/2022   ------------------------------------------------------------------------------------------------------------------ No results for input(s): "TSH", "T4TOTAL", "T3FREE", "THYROIDAB" in the last 72 hours.  Invalid input(s): "FREET3"  Cardiac Enzymes No results for input(s): "CKMB", "TROPONINI", "MYOGLOBIN" in the last 168 hours.  Invalid input(s): "CK" ------------------------------------------------------------------------------------------------------------------    Component Value Date/Time   BNP 24.5 05/10/2022 2038    CBG: Recent Labs  Lab 05/19/22 1325 05/19/22 1423 05/19/22 1708 05/19/22 2138 05/20/22 0748  GLUCAP 65* 76 104* 109* 117*     Recent Results (from the past 240 hour(s))  Resp panel by RT-PCR (RSV, Flu A&B, Covid) Anterior Nasal Swab     Status: None   Collection Time: 05/10/22  7:21 PM   Specimen: Anterior Nasal Swab  Result Value Ref Range Status   SARS Coronavirus 2 by RT PCR NEGATIVE NEGATIVE Final   Influenza A by PCR NEGATIVE NEGATIVE Final   Influenza B by PCR NEGATIVE NEGATIVE Final    Comment: (NOTE) The Xpert Xpress SARS-CoV-2/FLU/RSV plus assay is intended as an aid in the diagnosis of influenza from Nasopharyngeal swab specimens and should not be used as a sole basis for treatment. Nasal washings and aspirates are unacceptable for Xpert Xpress SARS-CoV-2/FLU/RSV testing.  Fact Sheet for  Patients: EntrepreneurPulse.com.au  Fact Sheet for Healthcare Providers: IncredibleEmployment.be  This test is not yet approved or cleared by the [place] FDA and has been authorized for detection and/or diagnosis of SARS-CoV-2 by FDA under an Emergency Use Authorization (EUA). This EUA will remain in effect (meaning this test can be used) for the duration of the COVID-19 declaration under Section 564(b)(1) of the Act, 21 U.S.C. section  360bbb-3(b)(1), unless the authorization is terminated or revoked.     Resp Syncytial Virus by PCR NEGATIVE NEGATIVE Final    Comment: (NOTE) Fact Sheet for Patients: EntrepreneurPulse.com.au  Fact Sheet for Healthcare Providers: IncredibleEmployment.be  This test is not yet approved or cleared by the [place] FDA and has been authorized for detection and/or diagnosis of SARS-CoV-2 by FDA under an Emergency Use Authorization (EUA). This EUA will remain in effect (meaning this test can be used) for the duration of the COVID-19 declaration under Section 564(b)(1) of the Act, 21 U.S.C. section 360bbb-3(b)(1), unless the authorization is terminated or revoked.  Performed at [facility], [place] [address] [place],  [address]      Radiology Studies: No results found.   Marzetta Board, MD, PhD [facility]  Between 7 am - 7 pm I am available, please contact me via Amion (for emergencies) or Securechat (non urgent messages)  Between 7 pm - 7 am I am not available, please contact night coverage MD/APP via Amion

## 2022-05-21 DIAGNOSIS — Z7901 Long term (current) use of anticoagulants: Secondary | ICD-10-CM

## 2022-05-21 DIAGNOSIS — K5909 Other constipation: Secondary | ICD-10-CM

## 2022-05-21 DIAGNOSIS — D649 Anemia, unspecified: Secondary | ICD-10-CM | POA: Diagnosis not present

## 2022-05-21 DIAGNOSIS — E1100 Type 2 diabetes mellitus with hyperosmolarity without nonketotic hyperglycemic-hyperosmolar coma (NKHHC): Secondary | ICD-10-CM | POA: Diagnosis not present

## 2022-05-21 LAB — CBC
HCT: 23.2 % — ABNORMAL LOW (ref 36.0–46.0)
Hemoglobin: 7.1 g/dL — ABNORMAL LOW (ref 12.0–15.0)
MCH: 26.5 pg (ref 26.0–34.0)
MCHC: 30.6 g/dL (ref 30.0–36.0)
MCV: 86.6 fL (ref 80.0–100.0)
Platelets: 583 10*3/uL — ABNORMAL HIGH (ref 150–400)
RBC: 2.68 MIL/uL — ABNORMAL LOW (ref 3.87–5.11)
RDW: 18.4 % — ABNORMAL HIGH (ref 11.5–15.5)
WBC: 13.5 10*3/uL — ABNORMAL HIGH (ref 4.0–10.5)
nRBC: 0 % (ref 0.0–0.2)

## 2022-05-21 LAB — GLUCOSE, CAPILLARY
Glucose-Capillary: 133 mg/dL — ABNORMAL HIGH (ref 70–99)
Glucose-Capillary: 106 mg/dL — ABNORMAL HIGH (ref 70–99)
Glucose-Capillary: 238 mg/dL — ABNORMAL HIGH (ref 70–99)

## 2022-05-21 LAB — BASIC METABOLIC PANEL
Anion gap: 13 (ref 5–15)
BUN: 84 mg/dL — ABNORMAL HIGH (ref 6–20)
CO2: 21 mmol/L — ABNORMAL LOW (ref 22–32)
Calcium: 8.9 mg/dL (ref 8.9–10.3)
Chloride: 98 mmol/L (ref 98–111)
Creatinine, Ser: 3.12 mg/dL — ABNORMAL HIGH (ref 0.44–1.00)
GFR, Estimated: 17 mL/min — ABNORMAL LOW (ref >60)
Glucose, Bld: 157 mg/dL — ABNORMAL HIGH (ref 70–99)
Potassium: 3.9 mmol/L (ref 3.5–5.1)
Sodium: 132 mmol/L — ABNORMAL LOW (ref 135–145)

## 2022-05-21 MED ORDER — GLYCERIN (LAXATIVE) 2 G RE SUPP
1.0000 | Freq: Once | RECTAL | Status: DC
  Filled 2022-05-21: qty 1

## 2022-05-21 MED ORDER — HYDROMORPHONE HCL 2 MG PO TABS
2.0000 mg | ORAL_TABLET | Freq: Four times a day (QID) | ORAL | Status: DC | PRN
  Administered 2022-05-21 – 2022-05-26 (×10): 2 mg via ORAL
  Filled 2022-05-21 (×10): qty 1

## 2022-05-21 MED ORDER — CYANOCOBALAMIN 1000 MCG/ML IJ SOLN
1000.0000 ug | Freq: Every day | INTRAMUSCULAR | Status: AC
  Administered 2022-05-23 – 2022-05-25 (×3): 1000 ug via INTRAMUSCULAR
  Filled 2022-05-21 (×5): qty 1

## 2022-05-21 NOTE — Progress Notes (Signed)
Occupational Therapy Treatment Patient Details Name: Deborah [name] MRN: [id number] DOB: 1965-04-16 Today's Date: 05/21/2022   History of present illness 57 y.o. female with medical history significant of DM2, CKD IV, A.fib    Gastric ulcer, carcinoid tumor of the duodenum, systolic CHF EF [id number], HTN    Admitted for   HHS, leg pain possible sciatica   OT comments  Limited progress this treatment session due to patient's complaints of pain and lethargic. Patient alert upon entry and declined getting to EOB but agreed to UE exercises. Patient became more lethargic during exercises for BUE shoulder horizontal abductions and elbow flexion and extension. Patient would benefit from further OT services once pain is better managed. Acute OT to continue to follow.    Recommendations for follow up therapy are one component of a multi-disciplinary discharge planning process, led by the attending physician.  Recommendations may be updated based on patient status, additional functional criteria and insurance authorization.    Follow Up Recommendations  Skilled nursing-short term rehab (<3 hours/day)     Assistance Recommended at Discharge Frequent or constant Supervision/Assistance  Patient can return home with the following  Assist for transportation;Assistance with cooking/housework;A lot of help with walking and/or transfers;A lot of help with bathing/dressing/bathroom   Equipment Recommendations  BSC/3in1    Recommendations for Other Services      Precautions / Restrictions Precautions Precautions: Fall Restrictions Weight Bearing Restrictions: No       Mobility Bed Mobility                    Transfers                         Balance                                           ADL either performed or assessed with clinical judgement   ADL Overall ADL's : Needs assistance/impaired                                        General ADL Comments: declined self care tasks due to female therapist    Extremity/Trunk Assessment              Vision       Perception     Praxis      Cognition Arousal/Alertness: Lethargic Behavior During Therapy: Flat affect Overall Cognitive Status: Impaired/Different from baseline Area of Impairment: Safety/judgement, Problem solving                         Safety/Judgement: Decreased awareness of deficits, Decreased awareness of safety   Problem Solving: Requires verbal cues, Requires tactile cues General Comments: self limiting, alert upon entry and became lethargic during visit        Exercises Exercises: General Upper Extremity General Exercises - Upper Extremity Shoulder Horizontal ABduction: AROM, 10 reps, Both, Supine Elbow Flexion: Strengthening, 10 reps, Supine, Theraband Theraband Level (Elbow Flexion): Level 1 (Yellow) Elbow Extension: Strengthening, Both, 10 reps, Supine, Theraband    Shoulder Instructions       General Comments      Pertinent Vitals/ Pain       Pain Assessment Pain Assessment: Faces Faces Pain Scale: Hurts  even more Pain Location:  (BUE shoulders and generalized) Pain Descriptors / Indicators: Aching, Discomfort, Grimacing Pain Intervention(s): Limited activity within patient's tolerance, Monitored during session, Premedicated before session  Home Living                                          Prior Functioning/Environment              Frequency  Min 2X/week        Progress Toward Goals  OT Goals(current goals can now be found in the care plan section)  Progress towards OT goals: Not progressing toward goals - comment (self-limiting)  Acute Rehab OT Goals Patient Stated Goal: none stated OT Goal Formulation: With patient Time For Goal Achievement: 05/25/22 Potential to Achieve Goals: Fair ADL Goals Pt Will Perform Grooming: with set-up;sitting Pt Will Perform Lower Body  Dressing: with min assist;sit to/from stand Pt Will Transfer to Toilet: with min guard assist;squat pivot transfer;bedside commode Pt Will Perform Toileting - Clothing Manipulation and hygiene: with modified independence Pt/caregiver will Perform Home Exercise Program: Increased ROM;Left upper extremity;With Supervision;Increased strength;Both right and left upper extremity  Plan Frequency remains appropriate    Co-evaluation                 AM-PAC OT "6 Clicks" Daily Activity     Outcome Measure   Help from another person eating meals?: A Little Help from another person taking care of personal grooming?: A Little Help from another person toileting, which includes using toliet, bedpan, or urinal?: A Lot Help from another person bathing (including washing, rinsing, drying)?: A Lot Help from another person to put on and taking off regular upper body clothing?: A Little Help from another person to put on and taking off regular lower body clothing?: A Lot 6 Click Score: 15    End of Session    OT Visit Diagnosis: Unsteadiness on feet (R26.81);Pain Pain - Right/Left: Left Pain - part of body: Shoulder;Arm;Hand (generalized pain)   Activity Tolerance Patient limited by lethargy;Patient limited by pain   Patient Left in bed;with call bell/phone within reach   Nurse Communication Mobility status        Time: [id number] OT Time Calculation (min): 20 min  Charges: OT General Charges $OT Visit: 1 Visit OT Treatments $Therapeutic Exercise: 8-22 mins  Lodema Hong, [place]  Office [place] 05/21/2022, 1:45 PM

## 2022-05-21 NOTE — Consult Note (Signed)
Referring Provider: Dr. Cruzita Lederer, [facility] Primary Care Physician:  Cena Benton, MD Primary Gastroenterologist:  ? [facility]  Reason for Consultation:  Anemia  HPI: Deborah [name] is a 57 y.o. female with history of morbid obesity, spinal stenosis, uncontrolled DM2 with gastroparesis, CKD stage IV, PAF, HTN, duodenal carcinoid tumor resected in 10/2019 at [facility] in [place] (in [place]) who came into the hospital with abdominal pain, nausea, vomiting. She was found to have a blood sugar of 540 nonketotic hyperglycemia. She was started on insulin and admitted to the hospital. She also reported worsening lower extremity and back pain. GI symptoms resolved.  Unfortunately they have had issues with maintaining her hemoglobin while she is here.  She has received 2 units of packed red blood cells, initially on 05/17/2022 and then one on 05/19/2022.  Hemoglobin drifting down slightly again today at 7.1 g.  No sign of overt GI bleeding.  Hemoccult has been ordered, but patient has not had a bowel movement since 3/7.  Denies any sign of GI bleeding prior to admission.  She is on Eliquis.  Iron studies c/w AOCD.  Some GI history as follows:  EGD 07/2019: - Z-line regular, 36 cm from the incisors. - Normal mucosa was found in the entire stomach. - Non-bleeding duodenal ulcer with no stigmata of bleeding. - A single duodenal polyp. Resected and retrieved. Clip was placed. - Normal second portion of the duodenum.  A. DUODENUM, POLYPECTOMY:  - Low grade neuroendocrine tumor (carcinoid tumor), broadly present at  multiple tissue edges.  - Lymphovascular invasion is identified.  - See comment.   EGD/EUS at [facility] in August 2021 where they did biopsies, but they terminated the procedure due to difficulty breathing.  They had her return a week later on 11/06/2019 for repeat procedure with general anesthesia.  All those records are in [place].  EGD 07/2020:  - LA Grade A esophagitis with no bleeding. Suspected cause of black emesis. - A large amount of food (residue) in the stomach. - Retained food in the duodenum.  She tells me that she had a colonoscopy here in [place] last year, but I do not see that anywhere.  She is a very poor historian.  Past Medical History:  Diagnosis Date   Acute back pain with sciatica, left    Acute back pain with sciatica, right    AKI (acute kidney injury) ([place])    Anemia, unspecified    Cancer (HCC)    Carcinoid tumor of duodenum    Chest pain with normal coronary angiography 2019   Chronic kidney disease, stage 3b (HCC)    Chronic pain    Chronic systolic CHF (congestive heart failure) ([place])    Diabetes mellitus    DKA (diabetic ketoacidosis) ([place])    Drug-seeking behavior    21 hospitalizations and 14 CT a/p in 2 years for N/V and abdominal pain, demanding only IV dilaudid   Elevated troponin    chronic   Esophageal reflux    Fibromyalgia    Gastric ulcer    Gastroparesis    Gout    Hyperlipidemia    Hypertension    Hypokalemia    Hypomagnesemia    Lumbosacral stenosis    LVH (left ventricular hypertrophy)    Morbid obesity (HCC)    NICM (nonischemic cardiomyopathy) (HCC)    PAF (paroxysmal atrial fibrillation) ([place])    Stroke ([place]) 02/2011   Thrombocytosis    Vitamin B12 deficiency  anemia     Past Surgical History:  Procedure Laterality Date   BIOPSY  07/27/2019   Procedure: BIOPSY;  Surgeon: Clarene Essex, MD;  Location: [facility] ENDOSCOPY;  Service: Endoscopy;;   BIOPSY  07/30/2019   Procedure: BIOPSY;  Surgeon: Otis Brace, MD;  Location: [facility] ENDOSCOPY;  Service: Gastroenterology;;   CATARACT EXTRACTION  01/2014   CHOLECYSTECTOMY     COLONOSCOPY WITH PROPOFOL N/A 07/30/2019   Procedure: COLONOSCOPY WITH PROPOFOL;  Surgeon: Otis Brace, MD;  Location: [facility] ENDOS[facility];  Service: Gastroenterology;  Laterality: N/A;   ESOPHAGOGASTRODUODENOSCOPY N/A  07/27/2019   Procedure: ESOPHAGOGASTRODUODENOSCOPY (EGD);  Surgeon: Clarene Essex, MD;  Location: [facility];  Service: Endoscopy;  Laterality: N/A;   ESOPHAGOGASTRODUODENOSCOPY N/A 07/26/2020   Procedure: ESOPHAGOGASTRODUODENOSCOPY (EGD);  Surgeon: Arta Silence, MD;  Location: [facility];  Service: Endoscopy;  Laterality: N/A;   ESOPHAGOGASTRODUODENOSCOPY (EGD) WITH PROPOFOL N/A 08/02/2019   Procedure: ESOPHAGOGASTRODUODENOSCOPY (EGD) WITH PROPOFOL;  Surgeon: Otis Brace, MD;  Location: [facility];  Service: Gastroenterology;  Laterality: N/A;   HEMOSTASIS CLIP PLACEMENT  08/02/2019   Procedure: HEMOSTASIS CLIP PLACEMENT;  Surgeon: Otis Brace, MD;  Location: [facility];  Service: Gastroenterology;;   POLYPECTOMY  07/30/2019   Procedure: POLYPECTOMY;  Surgeon: Otis Brace, MD;  Location: [facility];  Service: Gastroenterology;;   POLYPECTOMY  08/02/2019   Procedure: POLYPECTOMY;  Surgeon: Otis Brace, MD;  Location: [facility];  Service: Gastroenterology;;    Prior to Admission medications   Medication Sig Start Date End Date Taking? Authorizing Provider  albuterol (PROVENTIL) (2.5 MG/3ML) 0.083% nebulizer solution Take 3 mLs (2.5 mg total) by nebulization every 6 (six) hours as needed for wheezing or shortness of breath. 04/06/19  Yes Scot Jun, NP  apixaban (ELIQUIS) 5 MG TABS tablet Take 5 mg by mouth 2 (two) times daily. 12/03/21  Yes [provider]  atorvastatin (LIPITOR) 10 MG tablet Take 10 mg by mouth daily. 12/03/21  Yes [provider]  cetirizine (ZYRTEC) 10 MG tablet TAKE 1 TABLET (10 MG TOTAL) BY MOUTH DAILY (AM) Patient taking differently: Take 10 mg by mouth daily. 06/30/21  Yes Raulkar, Clide Deutscher, MD  DULoxetine HCl 40 MG CPEP Take 40 mg by mouth in the morning and at bedtime. 03/22/22  Yes Paige, Weldon Picking, DO  famotidine (PEPCID) 20 MG tablet Take 1 tablet (20 mg total) by mouth daily. 03/22/22 07/20/22 Yes Paige, Weldon Picking, DO  fluticasone (FLONASE) 50 MCG/ACT nasal spray Place 2 sprays into both nostrils daily as needed for allergies or rhinitis. Patient taking differently: Place 1 spray into both nostrils daily as needed for allergies. 12/19/18  Yes Rai, Ripudeep K, MD  folic acid (FOLVITE) 1 MG tablet Take 1 mg by mouth daily. 04/07/21  Yes [provider]  hydrALAZINE (APRESOLINE) 25 MG tablet Take 1 tablet (25 mg total) by mouth 3 (three) times daily. 03/22/22  Yes Paige, Victoria J, DO  HYDROmorphone (DILAUDID) 4 MG tablet TAKE 1 TABLET (4 MG TOTAL) BY MOUTH 2 (TWO) TIMES DAILY. MAY TAKE AN EXTRA TABLET WHEN PAIN IS SEVERE 20 DAYS OUT OF THE MONTH  FILL ON 04/02/2022  Patient taking differently: Take 4 mg by mouth in the morning and at bedtime. MAY TAKE AN EXTRA TABLET WHEN PAIN IS SEVERE 20 DAYS OUT OF THE MONTH 04/29/22  Yes Raulkar, Clide Deutscher, MD  insulin lispro (HUMALOG) 100 UNIT/ML KwikPen Inject 7 Units into the skin 3 (three) times daily with meals. Patient taking differently: Inject 30 Units into the skin  in the morning, at noon, in the evening, and at bedtime. 08/28/21  Yes Gaylan Gerold, DO  isosorbide mononitrate (IMDUR) 30 MG 24 hr tablet Take 0.5 tablets (15 mg total) by mouth daily. 03/22/22  Yes Paige, Victoria J, DO  LANTUS SOLOSTAR 100 UNIT/ML Solostar Pen Inject 35 Units into the skin at bedtime. Patient taking differently: Inject 50 Units into the skin at bedtime. 08/28/21  Yes Gaylan Gerold, DO  magnesium chloride (SLOW-MAG) 64 MG TBEC SR tablet Take 1 tablet by mouth daily. 12/07/21  Yes [provider]  metoprolol succinate (TOPROL-XL) 50 MG 24 hr tablet Take 1 tablet (50 mg total) by mouth daily. Take with or immediately following a meal. 11/24/20 03/18/23 Yes Enzo Bi, MD  ondansetron (ZOFRAN ODT) 4 MG disintegrating tablet Take 1 tablet (4 mg total) by mouth every 8 (eight) hours as needed for nausea or vomiting. 05/30/21  Yes Sherwood Gambler, MD  pantoprazole (PROTONIX) 40 MG  tablet TAKE 1 TABLET BY MOUTH 2 (TWO) TIMES DAILY. (AM+BEDTIME) Patient taking differently: Take 40 mg by mouth 2 (two) times daily. 03/11/22  Yes Raulkar, Clide Deutscher, MD  sodium bicarbonate 650 MG tablet Take 1 tablet (650 mg total) by mouth 2 (two) times daily. 04/20/21  Yes Jennye Boroughs, MD  torsemide (DEMADEX) 100 MG tablet Take 50 mg by mouth 2 (two) times daily. 12/03/21  Yes [provider]  Vitamin D, Ergocalciferol, (DRISDOL) 1.25 MG (50000 UNIT) CAPS capsule Take 50,000 Units by mouth every Monday. 04/07/21  Yes [provider]  EASY COMFORT PEN NEEDLES 31G X 5 MM MISC USE 3 TIMES A DAY FOR INSULIN ADMINISTRATION 11/14/19   Meccariello, Bernita Raisin, MD  SENNA PLUS 8.6-50 MG tablet Take 1 tablet by mouth daily. Patient not taking: Reported on 05/11/2022 05/19/21   [provider]  NOVOLOG FLEXPEN 100 UNIT/ML FlexPen Inject 7 Units into the skin 3 (three) times daily with meals. 08/28/21 08/28/21  Gaylan Gerold, DO    Current Facility-Administered Medications  Medication Dose Route Frequency Provider Last Rate Last Admin   albuterol (PROVENTIL) (2.5 MG/3ML) 0.083% nebulizer solution 2.5 mg  2.5 mg Nebulization Q6H PRN Doutova, Anastassia, MD       atorvastatin (LIPITOR) tablet 10 mg  10 mg Oral Daily Doutova, Anastassia, MD   10 mg at 05/21/22 [id number]   cyanocobalamin (VITAMIN B12) injection 1,000 mcg  1,000 mcg Intramuscular Daily Gherghe, Costin M, MD       dextrose 50 % solution 0-50 mL  0-50 mL Intravenous PRN Doutova, Anastassia, MD       diazepam (VALIUM) injection 5 mg  5 mg Intravenous Once PRN Opyd, Ilene Qua, MD       diclofenac Sodium (VOLTAREN) 1 % topical gel 4 g  4 g Topical QID Hosie Poisson, MD   4 g at 05/20/22 2148   DULoxetine (CYMBALTA) DR capsule 40 mg  40 mg Oral BID Hosie Poisson, MD   40 mg at 05/21/22 0926   famotidine (PEPCID) tablet 20 mg  20 mg Oral Daily Hosie Poisson, MD   20 mg at 05/21/22 0926   ferrous sulfate tablet 325 mg  325 mg Oral BID WC  Hosie Poisson, MD   325 mg at 05/21/22 0927   furosemide (LASIX) tablet 80 mg  80 mg Oral TID Hosie Poisson, MD   80 mg at 05/21/22 0926   HYDROmorphone (DILAUDID) tablet 2 mg  2 mg Oral Q6H PRN Caren Griffins, MD  insulin aspart (novoLOG) injection 0-20 Units  0-20 Units Subcutaneous TID WC Annita Brod, MD   4 Units at 05/21/22 [id number]   insulin aspart (novoLOG) injection 0-5 Units  0-5 Units Subcutaneous QHS Annita Brod, MD   2 Units at 05/16/22 2052   insulin aspart (novoLOG) injection 8 Units  8 Units Subcutaneous TID WC Caren Griffins, MD   8 Units at 05/21/22 [id number]   insulin glargine-yfgn [place]) injection 40 Units  40 Units Subcutaneous Daily Caren Griffins, MD   40 Units at 05/21/22 [id number]   loratadine (CLARITIN) tablet 10 mg  10 mg Oral Daily Toy Baker, MD   10 mg at 05/21/22 [id number]   melatonin tablet 3 mg  3 mg Oral QHS PRN Vianne Bulls, MD   3 mg at 05/18/22 2116   metoprolol succinate (TOPROL-XL) 24 hr tablet 50 mg  50 mg Oral Daily Annita Brod, MD   50 mg at 05/21/22 [id number]   morphine (MS CONTIN) 12 hr tablet 15 mg  15 mg Oral Q12H Caren Griffins, MD   15 mg at 05/21/22 [id number]   multivitamin with minerals tablet 1 tablet  1 tablet Oral Daily Annita Brod, MD   1 tablet at 05/21/22 0925   ondansetron [facility]) injection 4 mg  4 mg Intravenous Q6H PRN Opyd, Ilene Qua, MD   4 mg at 05/16/22 2126   pantoprazole (PROTONIX) EC tablet 40 mg  40 mg Oral BID Toy Baker, MD   40 mg at 05/21/22 [id number]   polyethylene glycol (MIRALAX / GLYCOLAX) packet 17 g  17 g Oral Daily Caren Griffins, MD   17 g at 05/21/22 [id number]   senna-docusate (Senokot-S) tablet 2 tablet  2 tablet Oral BID Caren Griffins, MD   2 tablet at 05/21/22 [id number]   sodium bicarbonate tablet 650 mg  650 mg Oral BID Toy Baker, MD   650 mg at 05/21/22 0926    Allergies as of 05/10/2022 - Review Complete 05/10/2022  Allergen Reaction Noted   Diazepam [name]  09/21/2011   Gabapentin [name] and Swelling 03/27/2017   Iodinated contrast media Anaphylaxis 11/28/2017   Isovue [iopamidol] Anaphylaxis 11/28/2017   Lisinopril Anaphylaxis 09/21/2011   Metoclopramide Other (See Comments) 08/08/2019   Nsaids Anaphylaxis and Other (See Comments) 09/21/2015   Penicillins Palpitations 02/04/2011   Dicyclomine hcl Other (See Comments) 09/13/2021   Tolmetin Nausea Only and Other (See Comments) 09/21/2015   Rifamycins Other (See Comments) 07/25/2020   Acetaminophen Nausea Only and Other (See Comments) 08/28/2015   Cyclobenzaprine Palpitations 05/17/2020   Oxycodone Palpitations 10/09/2020   Tramadol Nausea And Vomiting 12/22/2015    Family History  Problem Relation Age of Onset   Diabetes Mother    Diabetes Father    Heart disease Father    Diabetes Sister    Congestive Heart Failure Sister 60   Diabetes Brother     Social History   Socioeconomic History   Marital status: Married    Spouse name: Not on file   Number of children: Not on file   Years of education: Not on file   Highest education level: Not on file  Occupational History   Not on file  Tobacco Use   Smoking status: Never   Smokeless tobacco: Never  Vaping Use   Vaping Use: Never used  Substance and Sexual Activity   Alcohol use: No   Drug use: No   Sexual activity: Not Currently  Birth control/protection: None  Other Topics Concern   Not on file  Social History Narrative   ** Merged History Encounter **       Social Determinants of Health   Financial Resource Strain: Not on file  Food Insecurity: No Food Insecurity (05/11/2022)   Hunger Vital Sign    Worried About Running Out of Food in the Last Year: Never true    Ran Out of Food in the Last Year: Never true  Transportation Needs: No Transportation Needs (05/11/2022)   PRAPARE - Hydrologist (Medical): No    Lack of Transportation (Non-Medical): No  Physical Activity: Not  on file  Stress: Not on file  Social Connections: Not on file  Intimate Partner Violence: Not At Risk (05/11/2022)   Humiliation, Afraid, Rape, and Kick questionnaire    Fear of Current or Ex-Partner: No    Emotionally Abused: No    Physically Abused: No    Sexually Abused: No    Review of Systems: ROS is O/W negative except as mentioned in HPI.  Physical Exam: Vital signs in last 24 hours: Temp:  [97.9 F (36.6 C)-99.1 F (37.3 C)] 97.9 F (36.6 C) (03/15 0805) Pulse Rate:  [89-98] 89 (03/15 0805) Resp:  [17-18] 18 (03/15 0805) BP: (108-126)/(60-67) 111/65 (03/15 0805) SpO2:  [97 %-99 %] 97 % (03/15 0805) Last BM Date : 05/18/22 General:  Alert, Well-developed, well-nourished, pleasant and cooperative in NAD Head:  Normocephalic and atraumatic. Eyes:  Sclera clear, no icterus.  Conjunctiva pink. Ears:  Normal auditory acuity. Mouth:  No deformity or lesions.   Lungs:  Clear throughout to auscultation.  No wheezes, crackles, or rhonchi.  Heart:  Regular rate and rhythm; no murmurs, clicks, rubs, or gallops. Abdomen:  Soft, non-distended.  BS present.  Non-tender.    Msk:  Symmetrical without gross deformities. Pulses:  Normal pulses noted. Extremities:  Without clubbing or edema. Neurologic:  Alert and oriented x 4. Skin:  Intact without significant lesions or rashes.  Intake/Output from previous day: 03/14 0701 - 03/15 0700 In: -  Out: 500 [Urine:500]  Lab Results: Recent Labs    05/19/22 0657 05/19/22 2035 05/21/22 0551  WBC 14.4* 15.1* 13.5*  HGB 6.6* 7.7* 7.1*  HCT 20.4* 23.9* 23.2*  PLT 500* 535* 583*   BMET Recent Labs    05/19/22 0657 05/21/22 0551  NA 131* 132*  K 3.9 3.9  CL 97* 98  CO2 22 21*  GLUCOSE 121* 157*  BUN 83* 84*  CREATININE 3.23* 3.12*  CALCIUM 8.8* 8.9   IMPRESSION:  *Normocytic anemia, anemia of CKD-likely secondary to chronic kidney disease.  She has been here 10 days and has received 2 units PRBCs, one on 3/7 and one on  3/13.  Hgb again drifted down to 7.1 grams today.  No overt sign of GI bleeding.  Iron studies c/w anemia of chronic disease.  Last EGD 07/2020 and she says that she had a colonoscopy last year but I do not have record of that. Spinal stenosis, lumbar region with neurogenic claudication  PAF-continue Eliquis  CKD, stage IV Duodenal neuroendocrine tumor:  Resected at [facility] in [place], [place] in 10/2019. Constipation:  Last BM was 3/7.  Has Miralax daily and senokot-S BID ordered.  PLAN: -Monitor Hgb and transfuse prn. -? Endoscopic evaluation and timing TBD. -Pantoprazole 40 mg PO BID for now. -Check FOBT when patient has a BM. -Will order a glycerin suppository.  Deborah [name]. Deborah [name]  05/21/2022, 12:55 PM

## 2022-05-21 NOTE — Progress Notes (Signed)
PROGRESS NOTE  Deborah [name] [id number] DOB: 1965-12-19 DOA: 05/10/2022 PCP: Cena Benton, MD   LOS: 10 days   Brief Narrative / Interim history: 57 year old female with history of morbid obesity, spinal stenosis, uncontrolled DM2 with gastroparesis, CKD stage IV, PAF, HTN, duodenal carcinoid tumor who came into the hospital with abdominal pain, nausea, vomiting.  She was found to have a blood sugar of 540 nonketotic hyperglycemia.  She was started on insulin and admitted to the hospital.  She also reported worsening lower extremity and back pain.  Subjective / 24h Interval events: Continues to complain of back pain.  Assesement and Plan: Principal Problem:   Hyperosmolar non-ketotic state due to type 2 diabetes mellitus ([place]) Active Problems:   Spinal stenosis, lumbar region with neurogenic claudication   Leukocytosis   PAF (paroxysmal atrial fibrillation) (HCC)   Chronic kidney disease (CKD), stage IV (severe) (HCC)   Essential hypertension, benign   Malignant carcinoid tumor of duodenum (HCC)   Normocytic anemia   Hypomagnesemia and hypokalemia   GERD (gastroesophageal reflux disease)   Morbid obesity ([place])  Principal problem Normocytic anemia, iron deficiency anemia, anemia of CKD-likely secondary to chronic kidney disease, she is status post units of red blood cells, however hemoglobin still trending down.  There is concern for GI source, gastroenterology consulted, appreciate input  Active problems Spinal stenosis, lumbar region with neurogenic claudication -she has been wheelchair-bound for several months, now has worsening pain in the lower extremities more so on the right.  Lumbar spine MRI was done on 3/6 which showed lower lumbar spondylosis worsening since 2019, worst at L4-5, with facet arthropathy with joint effusion and periarticular edema and unchanged severe right neuroforaminal narrowing at L5-S1.  Neurosurgery evaluated patient and did not  recommend surgical approach at this point but continued PT, outpatient follow-up -Pain is now controlled with oral agents, she refused lidocaine patch due to the fact that this is never worked for, has been placed on MS Contin alternating with oral Dilaudid, seems to be better.  Decrease Dilaudid dose today due to her being a little somnolent  Hyperosmolar nonketotic state due to poorly controlled DM2, with hyperglycemia -fairly well-controlled now on glargine as well as scheduled insulin plus sliding scale.  Continue regimen as below.  A1c was 10  CBG (last 3)  Recent Labs    05/20/22 1536 05/20/22 2330 05/21/22 1124  GLUCAP 150* 190* 133*   Abdominal pain, nausea, vomiting -intermittent in the setting of gastroparesis, now appears resolved  PAF-continue Eliquis, metoprolol  CKD, stage IV-baseline around 3, currently at baseline.  Continue furosemide  Duodenal malignant carcinoid tumor-outpatient follow-up with oncology although it is not clear to me what kind of treatment she got in the past.  I wonder whether this is related to her anemia, GI consulted, appreciate input.  Leukocytosis-no clear-cut source of infection, monitor off antibiotics, she is afebrile.  Procalcitonin was low at 0.12.  UA negative, CT chest, abdomen, pelvis negative for apparent infections   Scheduled Meds:  apixaban  5 mg Oral BID   atorvastatin  10 mg Oral Daily   cyanocobalamin  1,000 mcg Intramuscular Daily   diclofenac Sodium  4 g Topical QID   DULoxetine  40 mg Oral BID   famotidine  20 mg Oral Daily   ferrous sulfate  325 mg Oral BID WC   furosemide  80 mg Oral TID   insulin aspart  0-20 Units Subcutaneous TID WC   insulin aspart  0-5 Units Subcutaneous  QHS   insulin aspart  8 Units Subcutaneous TID WC   insulin glargine-yfgn  40 Units Subcutaneous Daily   loratadine  10 mg Oral Daily   metoprolol succinate  50 mg Oral Daily   morphine  15 mg Oral Q12H   multivitamin with minerals  1 tablet Oral  Daily   pantoprazole  40 mg Oral BID   polyethylene glycol  17 g Oral Daily   senna-docusate  2 tablet Oral BID   sodium bicarbonate  650 mg Oral BID   Continuous Infusions: PRN Meds:.albuterol, dextrose, diazepam, HYDROmorphone, melatonin, ondansetron (ZOFRAN) IV  Current Outpatient Medications  Medication Instructions   albuterol (PROVENTIL) 2.5 mg, Nebulization, Every 6 hours PRN   apixaban (ELIQUIS) 5 mg, Oral, 2 times daily   atorvastatin (LIPITOR) 10 mg, Oral, Daily   cetirizine (ZYRTEC) 10 MG tablet TAKE 1 TABLET (10 MG TOTAL) BY MOUTH DAILY (AM)   DULoxetine HCl 40 mg, Oral, 2 times daily   EASY COMFORT PEN NEEDLES 31G X 5 MM MISC USE 3 TIMES A DAY FOR INSULIN ADMINISTRATION   famotidine (PEPCID) 20 mg, Oral, Daily   fluticasone (FLONASE) 50 MCG/ACT nasal spray 2 sprays, Each Nare, Daily PRN   folic acid (FOLVITE) 1 mg, Oral, Daily   HumaLOG KwikPen 7 Units, Subcutaneous, 3 times daily with meals   hydrALAZINE (APRESOLINE) 25 mg, Oral, 3 times daily   HYDROmorphone (DILAUDID) 4 MG tablet TAKE 1 TABLET (4 MG TOTAL) BY MOUTH 2 (TWO) TIMES DAILY. MAY TAKE AN EXTRA TABLET WHEN PAIN IS SEVERE 20 DAYS OUT OF THE MONTH  FILL ON 04/02/2022    isosorbide mononitrate (IMDUR) 15 mg, Oral, Daily   Lantus SoloStar 35 Units, Subcutaneous, Daily at bedtime   magnesium chloride (SLOW-MAG) 64 MG TBEC SR tablet 1 tablet, Oral, Daily   metoprolol succinate (TOPROL-XL) 50 mg, Oral, Daily, Take with or immediately following a meal.   ondansetron (ZOFRAN ODT) 4 mg, Oral, Every 8 hours PRN   pantoprazole (PROTONIX) 40 MG tablet TAKE 1 TABLET BY MOUTH 2 (TWO) TIMES DAILY. (AM+BEDTIME)   SENNA PLUS 8.6-50 MG tablet 1 tablet, Daily   sodium bicarbonate 650 mg, Oral, 2 times daily   torsemide (DEMADEX) 50 mg, Oral, 2 times daily   Vitamin D (Ergocalciferol) (DRISDOL) 50,000 Units, Oral, Every Mon    Diet Orders (From admission, onward)     Start     Ordered   05/11/22 1110  Diet Carb Modified  Fluid consistency: Thin; Room service appropriate? Yes  Diet effective now       Question Answer Comment  Diet-HS Snack? Nothing   Calorie Level Medium 1600-2000   Fluid consistency: Thin   Room service appropriate? Yes      05/11/22 1110            DVT prophylaxis:  apixaban (ELIQUIS) tablet 5 mg   Lab Results  Component Value Date   PLT 583 (H) 05/21/2022      Code Status: Full Code  Family Communication: no family at bedside   Status is: Inpatient  Remains inpatient appropriate because: severity of illness  Level of care: Med-Surg  Consultants:  Neurosurgery   Objective: Vitals:   05/20/22 1555 05/20/22 1948 05/21/22 0420 05/21/22 0805  BP: 108/60 109/67 126/64 111/65  Pulse: 91 98 93 89  Resp: 17 17 17 18   Temp: 99.1 F (37.3 C) 97.9 F (36.6 C) 98.7 F (37.1 C) 97.9 F (36.6 C)  TempSrc: Oral Oral  Oral  SpO2:  99% 98% 98% 97%  Weight:      Height:        Intake/Output Summary (Last 24 hours) at 05/21/2022 1139 Last data filed at 05/21/2022 0422 Gross per 24 hour  Intake --  Output 500 ml  Net -500 ml    Wt Readings from Last 3 Encounters:  05/13/22 (!) 139.2 kg  03/20/22 115.2 kg  08/27/21 132.8 kg    Examination:  Constitutional: NAD Eyes: lids and conjunctivae normal, no scleral icterus ENMT: mmm Neck: normal, supple Respiratory: clear to auscultation bilaterally, no wheezing, no crackles.   Cardiovascular: Regular rate and rhythm, no murmurs / rubs / gallops.  Abdomen: soft, no distention, no tenderness. Bowel sounds positive.  Skin: no rashes Neurologic: no focal deficits, equal strength   Data Reviewed: I have independently reviewed following labs and imaging studies   CBC Recent Labs  Lab 05/15/22 1802 05/18/22 0910 05/19/22 0657 05/19/22 2035 05/21/22 0551  WBC 15.0* 14.6* 14.4* 15.1* 13.5*  HGB 7.6* 7.2* 6.6* 7.7* 7.1*  HCT 24.2* 22.6* 20.4* 23.9* 23.2*  PLT 492* 532* 500* 535* 583*  MCV 90.0 89.0 87.6 84.2 86.6   MCH 28.3 28.3 28.3 27.1 26.5  MCHC 31.4 31.9 32.4 32.2 30.6  RDW 14.3 14.6 14.9 17.8* 18.4*  LYMPHSABS 1.7 1.2  --   --   --   MONOABS 1.3* 0.6  --   --   --   EOSABS 0.4 0.3  --   --   --   BASOSABS 0.1 0.1  --   --   --      Recent Labs  Lab 05/15/22 1802 05/18/22 0910 05/19/22 0657 05/21/22 0551  NA 131* 128* 131* 132*  K 4.0 4.3 3.9 3.9  CL 99 97* 97* 98  CO2 21* 18* 22 21*  GLUCOSE 250* 249* 121* 157*  BUN 70* 83* 83* 84*  CREATININE 3.06* 3.23* 3.23* 3.12*  CALCIUM 9.3 8.9 8.8* 8.9     ------------------------------------------------------------------------------------------------------------------ No results for input(s): "CHOL", "HDL", "LDLCALC", "TRIG", "CHOLHDL", "LDLDIRECT" in the last 72 hours.  Lab Results  Component Value Date   HGBA1C 10.1 (H) 03/21/2022   ------------------------------------------------------------------------------------------------------------------ No results for input(s): "TSH", "T4TOTAL", "T3FREE", "THYROIDAB" in the last 72 hours.  Invalid input(s): "FREET3"  Cardiac Enzymes No results for input(s): "CKMB", "TROPONINI", "MYOGLOBIN" in the last 168 hours.  Invalid input(s): "CK" ------------------------------------------------------------------------------------------------------------------    Component Value Date/Time   BNP 24.5 05/10/2022 2038    CBG: Recent Labs  Lab 05/20/22 0748 05/20/22 1137 05/20/22 1536 05/20/22 2330 05/21/22 1124  GLUCAP 117* 134* 150* 190* 133*     No results found for this or any previous visit (from the past 240 hour(s)).    Radiology Studies: No results found.   Marzetta Board, MD, PhD [facility]  Between 7 am - 7 pm I am available, please contact me via Amion (for emergencies) or Securechat (non urgent messages)  Between 7 pm - 7 am I am not available, please contact night coverage MD/APP via Amion

## 2022-05-22 DIAGNOSIS — D649 Anemia, unspecified: Secondary | ICD-10-CM | POA: Diagnosis not present

## 2022-05-22 DIAGNOSIS — E1100 Type 2 diabetes mellitus with hyperosmolarity without nonketotic hyperglycemic-hyperosmolar coma (NKHHC): Secondary | ICD-10-CM | POA: Diagnosis not present

## 2022-05-22 LAB — CBC
HCT: 24.4 % — ABNORMAL LOW (ref 36.0–46.0)
Hemoglobin: 7.4 g/dL — ABNORMAL LOW (ref 12.0–15.0)
MCH: 26.4 pg (ref 26.0–34.0)
MCHC: 30.3 g/dL (ref 30.0–36.0)
MCV: 87.1 fL (ref 80.0–100.0)
Platelets: 644 10*3/uL — ABNORMAL HIGH (ref 150–400)
RBC: 2.8 MIL/uL — ABNORMAL LOW (ref 3.87–5.11)
RDW: 18.5 % — ABNORMAL HIGH (ref 11.5–15.5)
WBC: 15.8 10*3/uL — ABNORMAL HIGH (ref 4.0–10.5)
nRBC: 0 % (ref 0.0–0.2)

## 2022-05-22 LAB — BASIC METABOLIC PANEL
Anion gap: 11 (ref 5–15)
BUN: 86 mg/dL — ABNORMAL HIGH (ref 6–20)
CO2: 24 mmol/L (ref 22–32)
Calcium: 9.1 mg/dL (ref 8.9–10.3)
Chloride: 97 mmol/L — ABNORMAL LOW (ref 98–111)
Creatinine, Ser: 3.51 mg/dL — ABNORMAL HIGH (ref 0.44–1.00)
GFR, Estimated: 14 mL/min — ABNORMAL LOW (ref >60)
Glucose, Bld: 263 mg/dL — ABNORMAL HIGH (ref 70–99)
Potassium: 4.2 mmol/L (ref 3.5–5.1)
Sodium: 132 mmol/L — ABNORMAL LOW (ref 135–145)

## 2022-05-22 LAB — GLUCOSE, CAPILLARY
Glucose-Capillary: 238 mg/dL — ABNORMAL HIGH (ref 70–99)
Glucose-Capillary: 285 mg/dL — ABNORMAL HIGH (ref 70–99)
Glucose-Capillary: 235 mg/dL — ABNORMAL HIGH (ref 70–99)
Glucose-Capillary: 151 mg/dL — ABNORMAL HIGH (ref 70–99)

## 2022-05-22 NOTE — Progress Notes (Signed)
     [facility] Progress Note  CC:   Anemia   Subjective:  Still no BM so hemoccult not performed.  She adamantly declined glycerin suppository yesterday per nursing staff.  Her mother is at bedside.  Objective:  Vital signs in last 24 hours: Temp:  [97.6 F (36.4 C)-98.9 F (37.2 C)] 98.9 F (37.2 C) (03/16 0750) Pulse Rate:  [81-96] 96 (03/16 0750) Resp:  [18-19] 19 (03/16 0414) BP: (102-116)/(50-77) 116/77 (03/16 0750) SpO2:  [99 %-100 %] 99 % (03/16 0750) Last BM Date : 05/18/22 General:  Alert, chronically ill-appearing, in NAD; tearful Heart:  Regular rate and rhythm; no murmurs Pulm:  CTAB.  No W/R/R. Abdomen:  Soft, obese, non-distended.  BS present.  Non-tender.   Intake/Output from previous day: 03/15 0701 - 03/16 0700 In: 240 [P.O.:240] Out: 700 [Urine:700]  Lab Results: Recent Labs    05/19/22 2035 05/21/22 0551 05/22/22 0849  WBC 15.1* 13.5* 15.8*  HGB 7.7* 7.1* 7.4*  HCT 23.9* 23.2* 24.4*  PLT 535* 583* 644*   BMET Recent Labs    05/21/22 0551 05/22/22 0849  NA 132* 132*  K 3.9 4.2  CL 98 97*  CO2 21* 24  GLUCOSE 157* 263*  BUN 84* 86*  CREATININE 3.12* 3.51*  CALCIUM 8.9 9.1   Assessment / Plan: *Normocytic anemia, anemia of CKD-likely secondary to chronic kidney disease.  She has been here 10 days and has received 2 units PRBCs, one on 3/7 and one on 3/13.  Hgb again drifted down to 7.1 grams today.  No overt sign of GI bleeding.  Iron studies c/w anemia of chronic disease.  Last EGD 07/2020 and she says that she had a colonoscopy last year but I do not have record of that.  Hgb today 7.4 grams.  Hemoccult still not performed. Spinal stenosis, lumbar region with neurogenic claudication  PAF-continue Eliquis  CKD, stage IV Duodenal neuroendocrine tumor:  Resected at [facility] in [place], [place] in 10/2019. Constipation:  Last BM was 3/7.  Has Miralax daily and senokot-S BID ordered.  -Monitor Hgb and transfuse  further prn. -Pantoprazole 40 mg PO BID for now. -Check FOBT when patient has a BM.  She refused a glycerin suppository yesterday even though no BM in over a week.  She does not have any evidence of overt GI bleeding and is still on her anticoagulation.  GI is signing off for now.   LOS: 11 days   Laban Emperor. Shawnette Augello  05/22/2022, 2:29 PM

## 2022-05-22 NOTE — Progress Notes (Signed)
PROGRESS NOTE  Deborah [name] [id number] DOB: Oct 05, 1965 DOA: 05/10/2022 PCP: Cena Benton, MD   LOS: 11 days   Brief Narrative / Interim history: 57 year old female with history of morbid obesity, spinal stenosis, uncontrolled DM2 with gastroparesis, CKD stage IV, PAF, HTN, duodenal carcinoid tumor who came into the hospital with abdominal pain, nausea, vomiting.  She was found to have a blood sugar of 540 nonketotic hyperglycemia.  She was started on insulin and admitted to the hospital.  She also reported worsening lower extremity and back pain.  Subjective / 24h Interval events: Pain controlled, no chest discomfort, no shortness of breath, no nausea or vomiting.  Assesement and Plan: Principal Problem:   Hyperosmolar non-ketotic state due to type 2 diabetes mellitus ([place]) Active Problems:   Spinal stenosis, lumbar region with neurogenic claudication   Leukocytosis   PAF (paroxysmal atrial fibrillation) (HCC)   Chronic kidney disease (CKD), stage IV (severe) (HCC)   Essential hypertension, benign   Malignant carcinoid tumor of duodenum (HCC)   Normocytic anemia   Hypomagnesemia and hypokalemia   GERD (gastroesophageal reflux disease)   Morbid obesity ([place])  Principal problem Normocytic anemia, iron deficiency anemia, anemia of CKD, concern for chronic blood loss anemia with a GI source -patient received total of 2 units of packed red blood cells, and hemoglobin continued to trend down.  GI consulted on 3/15, for now holding Eliquis.  They are considering an EGD given history of duodenal carcinoid tumor.  Active problems Spinal stenosis, lumbar region with neurogenic claudication -she has been wheelchair-bound for several months, now has worsening pain in the lower extremities more so on the right.  Lumbar spine MRI was done on 3/6 which showed lower lumbar spondylosis worsening since 2019, worst at L4-5, with facet arthropathy with joint effusion and  periarticular edema and unchanged severe right neuroforaminal narrowing at L5-S1.  Neurosurgery evaluated patient and did not recommend surgical approach at this point but continued PT, outpatient follow-up -Pain is now controlled with oral agents, she is on a combination of MS Contin BID alternating with oral Dilaudid  Hyperosmolar nonketotic state due to poorly controlled DM2, with hyperglycemia -fairly well-controlled now on glargine as well as scheduled insulin plus sliding scale.  Continue regimen as below.  A1c was 10  CBG (last 3)  Recent Labs    05/21/22 1515 05/21/22 2050 05/22/22 0753  GLUCAP 106* 238* 238*    Abdominal pain, nausea, vomiting -intermittent in the setting of gastroparesis, now appears resolved  PAF-continue metoprolol, Eliquis now on hold due to concern for GI bleed  CKD, stage IV-baseline around 3, currently at baseline.  Continue furosemide  Duodenal malignant carcinoid tumor-outpatient follow-up with oncology, apparently she had surgery few years back  Leukocytosis-no clear-cut source of infection, monitor off antibiotics, she is afebrile.  Procalcitonin was low at 0.12.  UA negative, CT chest, abdomen, pelvis negative for apparent infections   Scheduled Meds:  atorvastatin  10 mg Oral Daily   cyanocobalamin  1,000 mcg Intramuscular Daily   diclofenac Sodium  4 g Topical QID   DULoxetine  40 mg Oral BID   famotidine  20 mg Oral Daily   ferrous sulfate  325 mg Oral BID WC   furosemide  80 mg Oral TID   Glycerin (Adult)  1 suppository Rectal Once   insulin aspart  0-20 Units Subcutaneous TID WC   insulin aspart  0-5 Units Subcutaneous QHS   insulin aspart  8 Units Subcutaneous TID WC   insulin  glargine-yfgn  40 Units Subcutaneous Daily   loratadine  10 mg Oral Daily   metoprolol succinate  50 mg Oral Daily   morphine  15 mg Oral Q12H   multivitamin with minerals  1 tablet Oral Daily   pantoprazole  40 mg Oral BID   polyethylene glycol  17 g Oral  Daily   senna-docusate  2 tablet Oral BID   sodium bicarbonate  650 mg Oral BID   Continuous Infusions: PRN Meds:.albuterol, dextrose, diazepam, HYDROmorphone, melatonin, ondansetron (ZOFRAN) IV  Current Outpatient Medications  Medication Instructions   albuterol (PROVENTIL) 2.5 mg, Nebulization, Every 6 hours PRN   apixaban (ELIQUIS) 5 mg, Oral, 2 times daily   atorvastatin (LIPITOR) 10 mg, Oral, Daily   cetirizine (ZYRTEC) 10 MG tablet TAKE 1 TABLET (10 MG TOTAL) BY MOUTH DAILY (AM)   DULoxetine HCl 40 mg, Oral, 2 times daily   EASY COMFORT PEN NEEDLES 31G X 5 MM MISC USE 3 TIMES A DAY FOR INSULIN ADMINISTRATION   famotidine (PEPCID) 20 mg, Oral, Daily   fluticasone (FLONASE) 50 MCG/ACT nasal spray 2 sprays, Each Nare, Daily PRN   folic acid (FOLVITE) 1 mg, Oral, Daily   HumaLOG KwikPen 7 Units, Subcutaneous, 3 times daily with meals   hydrALAZINE (APRESOLINE) 25 mg, Oral, 3 times daily   HYDROmorphone (DILAUDID) 4 MG tablet TAKE 1 TABLET (4 MG TOTAL) BY MOUTH 2 (TWO) TIMES DAILY. MAY TAKE AN EXTRA TABLET WHEN PAIN IS SEVERE 20 DAYS OUT OF THE MONTH  FILL ON 04/02/2022    isosorbide mononitrate (IMDUR) 15 mg, Oral, Daily   Lantus SoloStar 35 Units, Subcutaneous, Daily at bedtime   magnesium chloride (SLOW-MAG) 64 MG TBEC SR tablet 1 tablet, Oral, Daily   metoprolol succinate (TOPROL-XL) 50 mg, Oral, Daily, Take with or immediately following a meal.   ondansetron (ZOFRAN ODT) 4 mg, Oral, Every 8 hours PRN   pantoprazole (PROTONIX) 40 MG tablet TAKE 1 TABLET BY MOUTH 2 (TWO) TIMES DAILY. (AM+BEDTIME)   SENNA PLUS 8.6-50 MG tablet 1 tablet, Daily   sodium bicarbonate 650 mg, Oral, 2 times daily   torsemide (DEMADEX) 50 mg, Oral, 2 times daily   Vitamin D (Ergocalciferol) (DRISDOL) 50,000 Units, Oral, Every Mon    Diet Orders (From admission, onward)     Start     Ordered   05/11/22 1110  Diet Carb Modified Fluid consistency: Thin; Room service appropriate? Yes  Diet effective now        Question Answer Comment  Diet-HS Snack? Nothing   Calorie Level Medium 1600-2000   Fluid consistency: Thin   Room service appropriate? Yes      05/11/22 1110            DVT prophylaxis:    Lab Results  Component Value Date   PLT 644 (H) 05/22/2022      Code Status: Full Code  Family Communication: no family at bedside   Status is: Inpatient  Remains inpatient appropriate because: severity of illness  Level of care: Med-Surg  Consultants:  Neurosurgery   Objective: Vitals:   05/21/22 1522 05/21/22 2112 05/22/22 0414 05/22/22 0750  BP: 111/67 (!) 108/50 102/60 116/77  Pulse: 81 85 91 96  Resp: 18  19   Temp:  98 F (36.7 C) 97.6 F (36.4 C) 98.9 F (37.2 C)  TempSrc:  Oral Oral Oral  SpO2: 100% 100% 100% 99%  Weight:      Height:        Intake/Output Summary (  Last 24 hours) at 05/22/2022 1045 Last data filed at 05/22/2022 0436 Gross per 24 hour  Intake 240 ml  Output 700 ml  Net -460 ml    Wt Readings from Last 3 Encounters:  05/13/22 (!) 139.2 kg  03/20/22 115.2 kg  08/27/21 132.8 kg    Examination:  Constitutional: NAD Eyes: lids and conjunctivae normal, no scleral icterus ENMT: mmm Neck: normal, supple Respiratory: clear to auscultation bilaterally, no wheezing, no crackles. Normal respiratory effort.  Cardiovascular: Regular rate and rhythm, no murmurs / rubs / gallops.  Abdomen: soft, no distention, no tenderness. Bowel sounds positive.  Skin: no rashes Neurologic: no focal deficits, equal strength   Data Reviewed: I have independently reviewed following labs and imaging studies   CBC Recent Labs  Lab 05/15/22 1802 05/18/22 0910 05/19/22 0657 05/19/22 2035 05/21/22 0551 05/22/22 0849  WBC 15.0* 14.6* 14.4* 15.1* 13.5* 15.8*  HGB 7.6* 7.2* 6.6* 7.7* 7.1* 7.4*  HCT 24.2* 22.6* 20.4* 23.9* 23.2* 24.4*  PLT 492* 532* 500* 535* 583* 644*  MCV 90.0 89.0 87.6 84.2 86.6 87.1  MCH 28.3 28.3 28.3 27.1 26.5 26.4  MCHC 31.4 31.9  32.4 32.2 30.6 30.3  RDW 14.3 14.6 14.9 17.8* 18.4* 18.5*  LYMPHSABS 1.7 1.2  --   --   --   --   MONOABS 1.3* 0.6  --   --   --   --   EOSABS 0.4 0.3  --   --   --   --   BASOSABS 0.1 0.1  --   --   --   --      Recent Labs  Lab 05/15/22 1802 05/18/22 0910 05/19/22 0657 05/21/22 0551 05/22/22 0849  NA 131* 128* 131* 132* 132*  K 4.0 4.3 3.9 3.9 4.2  CL 99 97* 97* 98 97*  CO2 21* 18* 22 21* 24  GLUCOSE 250* 249* 121* 157* 263*  BUN 70* 83* 83* 84* 86*  CREATININE 3.06* 3.23* 3.23* 3.12* 3.51*  CALCIUM 9.3 8.9 8.8* 8.9 9.1     ------------------------------------------------------------------------------------------------------------------ No results for input(s): "CHOL", "HDL", "LDLCALC", "TRIG", "CHOLHDL", "LDLDIRECT" in the last 72 hours.  Lab Results  Component Value Date   HGBA1C 10.1 (H) 03/21/2022   ------------------------------------------------------------------------------------------------------------------ No results for input(s): "TSH", "T4TOTAL", "T3FREE", "THYROIDAB" in the last 72 hours.  Invalid input(s): "FREET3"  Cardiac Enzymes No results for input(s): "CKMB", "TROPONINI", "MYOGLOBIN" in the last 168 hours.  Invalid input(s): "CK" ------------------------------------------------------------------------------------------------------------------    Component Value Date/Time   BNP 24.5 05/10/2022 2038    CBG: Recent Labs  Lab 05/20/22 2330 05/21/22 1124 05/21/22 1515 05/21/22 2050 05/22/22 0753  GLUCAP 190* 133* 106* 238* 238*     No results found for this or any previous visit (from the past 240 hour(s)).    Radiology Studies: No results found.   Marzetta Board, MD, PhD [facility]  Between 7 am - 7 pm I am available, please contact me via Amion (for emergencies) or Securechat (non urgent messages)  Between 7 pm - 7 am I am not available, please contact night coverage MD/APP via Amion

## 2022-05-22 NOTE — Progress Notes (Signed)
Physical Therapy Treatment Patient Details Name: Deborah [name] MRN: [id number] DOB: Dec 03, 1965 Today's Date: 05/22/2022   History of Present Illness 57 y.o. female with medical history significant of DM2, CKD IV, A.fib    Gastric ulcer, carcinoid tumor of the duodenum, systolic CHF EF [id number], HTN    Admitted for   HHS, leg pain possible sciatica    PT Comments    Pt endorses severe pain, states "I know what pain I am dealing with". Pt only requiring cuing for sequencing and close guard for safety to move to EOB, very increased time to perform and pt props on elbow x5 minutes before attempting to rise. PT attempting to help as needed, pt is irritable and disrespectful to all staff members in the room (PT, PT aide, NT). When pt continued to be rude to staff, PT stated she did not need to be ugly as we were all trying to help her, she told PT to stop talking and stop trying to mess with her psychologically when PT was only trying to help pt mobilize. Pt asked PT to leave the room, PT left pt at EOB with NT in room to perform wash up. MD made aware of pt's lack of effort during therapy this date.      Recommendations for follow up therapy are one component of a multi-disciplinary discharge planning process, led by the attending physician.  Recommendations may be updated based on patient status, additional functional criteria and insurance authorization.  Follow Up Recommendations  Skilled nursing-short term rehab (<3 hours/day)     Assistance Recommended at Discharge Frequent or constant Supervision/Assistance  Patient can return home with the following A lot of help with walking and/or transfers;A lot of help with bathing/dressing/bathroom;Assistance with cooking/housework;Assist for transportation;Help with stairs or ramp for entrance   Equipment Recommendations  Other (comment) (bariatric drop arm bsc)    Recommendations for Other Services       Precautions / Restrictions  Precautions Precautions: Fall Restrictions Weight Bearing Restrictions: No     Mobility  Bed Mobility Overal bed mobility: Needs Assistance Bed Mobility: Supine to Sit     Supine to sit: Min guard, HOB elevated     General bed mobility comments: VERY increased time, use of HOB elevation and bedrails    Transfers                   General transfer comment: pt declines attempt    Ambulation/Gait                   Stairs             Wheelchair Mobility    Modified Rankin (Stroke Patients Only)       Balance Overall balance assessment: Needs assistance Sitting-balance support: Bilateral upper extremity supported, Feet supported Sitting balance-Leahy Scale: Fair       Standing balance-Leahy Scale: Zero Standing balance comment: declines stand                            Cognition Arousal/Alertness: Lethargic Behavior During Therapy: Flat affect, Agitated, Restless Overall Cognitive Status: Impaired/Different from baseline Area of Impairment: Safety/judgement, Problem solving                         Safety/Judgement: Decreased awareness of deficits, Decreased awareness of safety   Problem Solving: Requires verbal cues, Requires tactile cues General Comments: pt is disrespectful and  irritable with staff, including PT, PT aide, and nurse tech in room. Pt lacks insight into deficits and how lack of participation carries over into functional deficits.        Exercises      General Comments        Pertinent Vitals/Pain Pain Assessment Pain Assessment: Faces Faces Pain Scale: Hurts whole lot Pain Location: LEs Pain Descriptors / Indicators: Aching, Discomfort, Grimacing Pain Intervention(s): Limited activity within patient's tolerance, Monitored during session, Repositioned    Home Living                          Prior Function            PT Goals (current goals can now be found in the care plan  section) Acute Rehab PT Goals Patient Stated Goal: To get rid of this pain PT Goal Formulation: With patient Time For Goal Achievement: 05/25/22 Potential to Achieve Goals: Fair Progress towards PT goals: Not progressing toward goals - comment (pt is self-limiting)    Frequency    Min 2X/week      PT Plan Current plan remains appropriate    Co-evaluation              AM-PAC PT "6 Clicks" Mobility   Outcome Measure  Help needed turning from your back to your side while in a flat bed without using bedrails?: A Little Help needed moving from lying on your back to sitting on the side of a flat bed without using bedrails?: A Little Help needed moving to and from a bed to a chair (including a wheelchair)?: Total Help needed standing up from a chair using your arms (e.g., wheelchair or bedside chair)?: Total Help needed to walk in hospital room?: Total Help needed climbing 3-5 steps with a railing? : Total 6 Click Score: 10    End of Session   Activity Tolerance: Patient limited by pain;Treatment limited secondary to agitation Patient left: in bed;with call bell/phone within reach;Other (comment) (sitting EOB with NT present) Nurse Communication: Mobility status PT Visit Diagnosis: Muscle weakness (generalized) (M62.81);Pain Pain - part of body: Leg     Time: [id number] PT Time Calculation (min) (ACUTE ONLY): 16 min  Charges:  $Therapeutic Activity: 8-22 mins                     Deborah [name], PT DPT Acute Rehabilitation Services Pager [phone]  Office [phone]    Bryan E Ruffin Pyo 05/22/2022, 11:21 AM

## 2022-05-23 DIAGNOSIS — E1100 Type 2 diabetes mellitus with hyperosmolarity without nonketotic hyperglycemic-hyperosmolar coma (NKHHC): Secondary | ICD-10-CM | POA: Diagnosis not present

## 2022-05-23 LAB — TYPE AND SCREEN
ABO/RH(D): O POS
Antibody Screen: NEGATIVE
Donor AG Type: NEGATIVE
Donor AG Type: NEGATIVE
Unit division: 0
Unit division: 0

## 2022-05-23 LAB — COMPREHENSIVE METABOLIC PANEL
ALT: 24 U/L (ref 0–44)
AST: 20 U/L (ref 15–41)
Albumin: 2 g/dL — ABNORMAL LOW (ref 3.5–5.0)
Alkaline Phosphatase: 94 U/L (ref 38–126)
Anion gap: 12 (ref 5–15)
BUN: 88 mg/dL — ABNORMAL HIGH (ref 6–20)
CO2: 23 mmol/L (ref 22–32)
Calcium: 9.3 mg/dL (ref 8.9–10.3)
Chloride: 96 mmol/L — ABNORMAL LOW (ref 98–111)
Creatinine, Ser: 3.22 mg/dL — ABNORMAL HIGH (ref 0.44–1.00)
GFR, Estimated: 16 mL/min — ABNORMAL LOW (ref >60)
Glucose, Bld: 198 mg/dL — ABNORMAL HIGH (ref 70–99)
Potassium: 4.1 mmol/L (ref 3.5–5.1)
Sodium: 131 mmol/L — ABNORMAL LOW (ref 135–145)
Total Bilirubin: 0.8 mg/dL (ref 0.3–1.2)
Total Protein: 7.7 g/dL (ref 6.5–8.1)

## 2022-05-23 LAB — BPAM RBC
Blood Product Expiration Date: 202404182359
Blood Product Expiration Date: 202404192359
ISSUE DATE / TIME: 202403131130
Unit Type and Rh: 5100
Unit Type and Rh: 5100

## 2022-05-23 LAB — CBC
HCT: 24.4 % — ABNORMAL LOW (ref 36.0–46.0)
Hemoglobin: 7.5 g/dL — ABNORMAL LOW (ref 12.0–15.0)
MCH: 26.8 pg (ref 26.0–34.0)
MCHC: 30.7 g/dL (ref 30.0–36.0)
MCV: 87.1 fL (ref 80.0–100.0)
Platelets: 662 10*3/uL — ABNORMAL HIGH (ref 150–400)
RBC: 2.8 MIL/uL — ABNORMAL LOW (ref 3.87–5.11)
RDW: 18.4 % — ABNORMAL HIGH (ref 11.5–15.5)
WBC: 16.4 10*3/uL — ABNORMAL HIGH (ref 4.0–10.5)
nRBC: 0 % (ref 0.0–0.2)

## 2022-05-23 LAB — GLUCOSE, CAPILLARY
Glucose-Capillary: 290 mg/dL — ABNORMAL HIGH (ref 70–99)
Glucose-Capillary: 297 mg/dL — ABNORMAL HIGH (ref 70–99)
Glucose-Capillary: 87 mg/dL (ref 70–99)
Glucose-Capillary: 60 mg/dL — ABNORMAL LOW (ref 70–99)
Glucose-Capillary: 92 mg/dL (ref 70–99)

## 2022-05-23 LAB — MAGNESIUM: Magnesium: 1.9 mg/dL (ref 1.7–2.4)

## 2022-05-23 MED ORDER — MILK AND MOLASSES ENEMA
1.0000 | Freq: Once | RECTAL | Status: DC
  Filled 2022-05-23: qty 240

## 2022-05-23 MED ORDER — APIXABAN 5 MG PO TABS
5.0000 mg | ORAL_TABLET | Freq: Two times a day (BID) | ORAL | Status: DC
  Administered 2022-05-23 – 2022-05-27 (×9): 5 mg via ORAL
  Filled 2022-05-23 (×9): qty 1

## 2022-05-23 NOTE — Progress Notes (Signed)
Patient had hypoglycemia tonight.  Semglee will be held off for 24 hours.

## 2022-05-23 NOTE — Progress Notes (Signed)
PROGRESS NOTE  Deborah [name] [id number] DOB: 1965-08-06 DOA: 05/10/2022 PCP: Cena Benton, MD   LOS: 12 days   Brief Narrative / Interim history: 57 year old female with history of morbid obesity, spinal stenosis, uncontrolled DM2 with gastroparesis, CKD stage IV, PAF, HTN, duodenal carcinoid tumor who came into the hospital with abdominal pain, nausea, vomiting.  She was found to have a blood sugar of 540 nonketotic hyperglycemia.  She was started on insulin and admitted to the hospital.  She also reported worsening lower extremity and back pain.  Subjective / 24h Interval events: Overall feels well.  Has been refusing medications all day yesterday except for her narcotics.  Still has not had a bowel movement  Assesement and Plan: Principal Problem:   Hyperosmolar non-ketotic state due to type 2 diabetes mellitus ([place]) Active Problems:   Spinal stenosis, lumbar region with neurogenic claudication   Leukocytosis   PAF (paroxysmal atrial fibrillation) (HCC)   Chronic kidney disease (CKD), stage IV (severe) (HCC)   Essential hypertension, benign   Malignant carcinoid tumor of duodenum (HCC)   Normocytic anemia   Hypomagnesemia and hypokalemia   GERD (gastroesophageal reflux disease)   Morbid obesity (HCC)   Anemia  Principal problem Normocytic anemia, iron deficiency anemia, anemia of CKD, concern for chronic blood loss anemia with a GI source -patient received total of 2 units of packed red blood cells, and hemoglobin continued to trend down.  GI consulted on 3/15, however with lack of bowel movement and hemoglobin stability they signed off 3/16.  Eliquis was on hold for 2 days, resume today and see what she does.  Continue aggressive bowel regimen to check a fecal occult.  Patient has been refusing MiraLAX as well as Senokot, discussed with her this morning that this is unacceptable and she needs to take this medications in order to have a bowel movement since it has  been more than a week.  Active problems Spinal stenosis, lumbar region with neurogenic claudication -she has been wheelchair-bound for several months, now has worsening pain in the lower extremities more so on the right.  Lumbar spine MRI was done on 3/6 which showed lower lumbar spondylosis worsening since 2019, worst at L4-5, with facet arthropathy with joint effusion and periarticular edema and unchanged severe right neuroforaminal narrowing at L5-S1.  Neurosurgery evaluated patient and did not recommend surgical approach at this point but continued PT, outpatient follow-up -Pain is now controlled with oral agents, she is on a combination of MS Contin BID alternating with oral Dilaudid  Hyperosmolar nonketotic state due to poorly controlled DM2, with hyperglycemia -fairly well-controlled now on glargine as well as scheduled insulin plus sliding scale.  Continue regimen as below.  A1c was 10  CBG (last 3)  Recent Labs    05/22/22 1651 05/22/22 2109 05/23/22 0816  GLUCAP 235* 151* 290*    Abdominal pain, nausea, vomiting -intermittent in the setting of gastroparesis, now appears resolved  PAF-continue metoprolol, challenge her with Eliquis this morning  CKD, stage IV-baseline around 3, currently at baseline.  Continue furosemide  Duodenal malignant carcinoid tumor-outpatient follow-up with oncology, apparently she had surgery few years back  Leukocytosis-no clear-cut source of infection, monitor off antibiotics, she is afebrile.  Procalcitonin was low at 0.12.  UA negative, CT chest, abdomen, pelvis negative for apparent infections   Scheduled Meds:  apixaban  5 mg Oral BID   atorvastatin  10 mg Oral Daily   cyanocobalamin  1,000 mcg Intramuscular Daily   diclofenac Sodium  4 g Topical QID   DULoxetine  40 mg Oral BID   famotidine  20 mg Oral Daily   ferrous sulfate  325 mg Oral BID WC   furosemide  80 mg Oral TID   Glycerin (Adult)  1 suppository Rectal Once   insulin aspart   0-20 Units Subcutaneous TID WC   insulin aspart  0-5 Units Subcutaneous QHS   insulin aspart  8 Units Subcutaneous TID WC   insulin glargine-yfgn  40 Units Subcutaneous Daily   loratadine  10 mg Oral Daily   metoprolol succinate  50 mg Oral Daily   milk and molasses  1 enema Rectal Once   morphine  15 mg Oral Q12H   multivitamin with minerals  1 tablet Oral Daily   pantoprazole  40 mg Oral BID   polyethylene glycol  17 g Oral Daily   senna-docusate  2 tablet Oral BID   sodium bicarbonate  650 mg Oral BID   Continuous Infusions: PRN Meds:.albuterol, dextrose, diazepam, HYDROmorphone, melatonin, ondansetron (ZOFRAN) IV  Current Outpatient Medications  Medication Instructions   albuterol (PROVENTIL) 2.5 mg, Nebulization, Every 6 hours PRN   apixaban (ELIQUIS) 5 mg, Oral, 2 times daily   atorvastatin (LIPITOR) 10 mg, Oral, Daily   cetirizine (ZYRTEC) 10 MG tablet TAKE 1 TABLET (10 MG TOTAL) BY MOUTH DAILY (AM)   DULoxetine HCl 40 mg, Oral, 2 times daily   EASY COMFORT PEN NEEDLES 31G X 5 MM MISC USE 3 TIMES A DAY FOR INSULIN ADMINISTRATION   famotidine (PEPCID) 20 mg, Oral, Daily   fluticasone (FLONASE) 50 MCG/ACT nasal spray 2 sprays, Each Nare, Daily PRN   folic acid (FOLVITE) 1 mg, Oral, Daily   HumaLOG KwikPen 7 Units, Subcutaneous, 3 times daily with meals   hydrALAZINE (APRESOLINE) 25 mg, Oral, 3 times daily   HYDROmorphone (DILAUDID) 4 MG tablet TAKE 1 TABLET (4 MG TOTAL) BY MOUTH 2 (TWO) TIMES DAILY. MAY TAKE AN EXTRA TABLET WHEN PAIN IS SEVERE 20 DAYS OUT OF THE MONTH  FILL ON 04/02/2022    isosorbide mononitrate (IMDUR) 15 mg, Oral, Daily   Lantus SoloStar 35 Units, Subcutaneous, Daily at bedtime   magnesium chloride (SLOW-MAG) 64 MG TBEC SR tablet 1 tablet, Oral, Daily   metoprolol succinate (TOPROL-XL) 50 mg, Oral, Daily, Take with or immediately following a meal.   ondansetron (ZOFRAN ODT) 4 mg, Oral, Every 8 hours PRN   pantoprazole (PROTONIX) 40 MG tablet TAKE 1 TABLET  BY MOUTH 2 (TWO) TIMES DAILY. (AM+BEDTIME)   SENNA PLUS 8.6-50 MG tablet 1 tablet, Daily   sodium bicarbonate 650 mg, Oral, 2 times daily   torsemide (DEMADEX) 50 mg, Oral, 2 times daily   Vitamin D (Ergocalciferol) (DRISDOL) 50,000 Units, Oral, Every Mon    Diet Orders (From admission, onward)     Start     Ordered   05/11/22 1110  Diet Carb Modified Fluid consistency: Thin; Room service appropriate? Yes  Diet effective now       Question Answer Comment  Diet-HS Snack? Nothing   Calorie Level Medium 1600-2000   Fluid consistency: Thin   Room service appropriate? Yes      05/11/22 1110            DVT prophylaxis:  apixaban (ELIQUIS) tablet 5 mg   Lab Results  Component Value Date   PLT 662 (H) 05/23/2022      Code Status: Full Code  Family Communication: no family at bedside   Status is: Inpatient  Remains inpatient appropriate because: severity of illness  Level of care: Med-Surg  Consultants:  Neurosurgery   Objective: Vitals:   05/22/22 2107 05/23/22 0615 05/23/22 0628 05/23/22 0750  BP: 122/78  130/82 (!) 121/51  Pulse: 89  (!) 113 (!) 104  Resp: 17 18 17 19   Temp: 98 F (36.7 C)  97.8 F (36.6 C) 98.3 F (36.8 C)  TempSrc:   Oral Oral  SpO2: 99% 100%  100%  Weight:      Height:       No intake or output data in the 24 hours ending 05/23/22 1114  Wt Readings from Last 3 Encounters:  05/13/22 (!) 139.2 kg  03/20/22 115.2 kg  08/27/21 132.8 kg    Examination:  Constitutional: NAD Eyes: lids and conjunctivae normal, no scleral icterus ENMT: mmm Neck: normal, supple Respiratory: clear to auscultation bilaterally, no wheezing, no crackles. Normal respiratory effort.  Cardiovascular: Regular rate and rhythm, no murmurs / rubs / gallops. No LE edema. Abdomen: soft, no distention, no tenderness. Bowel sounds positive.  Skin: no rashes Neurologic: no focal deficits, equal strength   Data Reviewed: I have independently reviewed following  labs and imaging studies   CBC Recent Labs  Lab 05/18/22 0910 05/19/22 0657 05/19/22 2035 05/21/22 0551 05/22/22 0849 05/23/22 0405  WBC 14.6* 14.4* 15.1* 13.5* 15.8* 16.4*  HGB 7.2* 6.6* 7.7* 7.1* 7.4* 7.5*  HCT 22.6* 20.4* 23.9* 23.2* 24.4* 24.4*  PLT 532* 500* 535* 583* 644* 662*  MCV 89.0 87.6 84.2 86.6 87.1 87.1  MCH 28.3 28.3 27.1 26.5 26.4 26.8  MCHC 31.9 32.4 32.2 30.6 30.3 30.7  RDW 14.6 14.9 17.8* 18.4* 18.5* 18.4*  LYMPHSABS 1.2  --   --   --   --   --   MONOABS 0.6  --   --   --   --   --   EOSABS 0.3  --   --   --   --   --   BASOSABS 0.1  --   --   --   --   --      Recent Labs  Lab 05/18/22 0910 05/19/22 0657 05/21/22 0551 05/22/22 0849 05/23/22 0405  NA 128* 131* 132* 132* 131*  K 4.3 3.9 3.9 4.2 4.1  CL 97* 97* 98 97* 96*  CO2 18* 22 21* 24 23  GLUCOSE 249* 121* 157* 263* 198*  BUN 83* 83* 84* 86* 88*  CREATININE 3.23* 3.23* 3.12* 3.51* 3.22*  CALCIUM 8.9 8.8* 8.9 9.1 9.3  AST  --   --   --   --  20  ALT  --   --   --   --  24  ALKPHOS  --   --   --   --  94  BILITOT  --   --   --   --  0.8  ALBUMIN  --   --   --   --  2.0*  MG  --   --   --   --  1.9     ------------------------------------------------------------------------------------------------------------------ No results for input(s): "CHOL", "HDL", "LDLCALC", "TRIG", "CHOLHDL", "LDLDIRECT" in the last 72 hours.  Lab Results  Component Value Date   HGBA1C 10.1 (H) 03/21/2022   ------------------------------------------------------------------------------------------------------------------ No results for input(s): "TSH", "T4TOTAL", "T3FREE", "THYROIDAB" in the last 72 hours.  Invalid input(s): "FREET3"  Cardiac Enzymes No results for input(s): "CKMB", "TROPONINI", "MYOGLOBIN" in the last 168 hours.  Invalid input(s): "CK" ------------------------------------------------------------------------------------------------------------------    Component  Value Date/Time   BNP 24.5  05/10/2022 2038    CBG: Recent Labs  Lab 05/22/22 0753 05/22/22 1202 05/22/22 1651 05/22/22 2109 05/23/22 0816  GLUCAP 238* 285* 235* 151* 290*     No results found for this or any previous visit (from the past 240 hour(s)).    Radiology Studies: No results found.   Marzetta Board, MD, PhD [facility]  Between 7 am - 7 pm I am available, please contact me via Amion (for emergencies) or Securechat (non urgent messages)  Between 7 pm - 7 am I am not available, please contact night coverage MD/APP via Amion

## 2022-05-23 NOTE — Progress Notes (Signed)
Pt in room yelling "Please help me" with door open.  Pt stated that she couldn't breath and I pointed out to her that if she couldn't breathe, then she wouldn't be able to speak or yell.  Explained about breathing in through nose and out through mouth slowly.  Also informed pt that certain medicines like Dilaudid will cause lower breathing and slow down bowels.  Pt was sitting on bed and stated she 'wanted to get out of the chair because it was hurting her back".  Pt then said she just wanted her medicine of Dilaudid and to go home. Informed pt that for [place] to help her, she needs to start taking ALL her medicine and allowing [place] to help her and not just give her Dilaudid and insulin.

## 2022-05-23 NOTE — Progress Notes (Signed)
  Signed      Late entry 0615, pt yelling help, help, went to the pts bedside, pt states  I cannot breath, am hperventillating. Spo2 100, pt not in distress, denies CP, encouraged to take in deep breaths. Pt keeps yelling I cannot breath, reassured but keeps yelling and loud difficult to have a conversation with her at this time. Wanted to sit on the chair but says its uncomfortable, sitted at the edge of the bed. She would like to talk with the supervisor, Charge RN informed. C/o generalized body aches, pain scale 8/10 dilaudid 2 mg given p.o., calmed down thereafter and now lying in bed. Encouraged to take her regular medications, has been refusing to take them.

## 2022-05-24 DIAGNOSIS — E1100 Type 2 diabetes mellitus with hyperosmolarity without nonketotic hyperglycemic-hyperosmolar coma (NKHHC): Secondary | ICD-10-CM | POA: Diagnosis not present

## 2022-05-24 LAB — CBC
HCT: 23.2 % — ABNORMAL LOW (ref 36.0–46.0)
Hemoglobin: 7.3 g/dL — ABNORMAL LOW (ref 12.0–15.0)
MCH: 26.9 pg (ref 26.0–34.0)
MCHC: 31.5 g/dL (ref 30.0–36.0)
MCV: 85.6 fL (ref 80.0–100.0)
Platelets: 620 10*3/uL — ABNORMAL HIGH (ref 150–400)
RBC: 2.71 MIL/uL — ABNORMAL LOW (ref 3.87–5.11)
RDW: 18.9 % — ABNORMAL HIGH (ref 11.5–15.5)
WBC: 15.7 10*3/uL — ABNORMAL HIGH (ref 4.0–10.5)
nRBC: 0 % (ref 0.0–0.2)

## 2022-05-24 LAB — GLUCOSE, CAPILLARY
Glucose-Capillary: 143 mg/dL — ABNORMAL HIGH (ref 70–99)
Glucose-Capillary: 123 mg/dL — ABNORMAL HIGH (ref 70–99)
Glucose-Capillary: 140 mg/dL — ABNORMAL HIGH (ref 70–99)
Glucose-Capillary: 187 mg/dL — ABNORMAL HIGH (ref 70–99)
Glucose-Capillary: 201 mg/dL — ABNORMAL HIGH (ref 70–99)
Glucose-Capillary: 174 mg/dL — ABNORMAL HIGH (ref 70–99)

## 2022-05-24 LAB — BASIC METABOLIC PANEL
Anion gap: 12 (ref 5–15)
BUN: 90 mg/dL — ABNORMAL HIGH (ref 6–20)
CO2: 23 mmol/L (ref 22–32)
Calcium: 9.2 mg/dL (ref 8.9–10.3)
Chloride: 97 mmol/L — ABNORMAL LOW (ref 98–111)
Creatinine, Ser: 3.08 mg/dL — ABNORMAL HIGH (ref 0.44–1.00)
GFR, Estimated: 17 mL/min — ABNORMAL LOW (ref >60)
Glucose, Bld: 138 mg/dL — ABNORMAL HIGH (ref 70–99)
Potassium: 4.2 mmol/L (ref 3.5–5.1)
Sodium: 132 mmol/L — ABNORMAL LOW (ref 135–145)

## 2022-05-24 LAB — OCCULT BLOOD X 1 CARD TO LAB, STOOL: Fecal Occult Bld: NEGATIVE

## 2022-05-24 MED ORDER — METHYLNALTREXONE BROMIDE 12 MG/0.6ML ~~LOC~~ SOLN
12.0000 mg | Freq: Once | SUBCUTANEOUS | Status: AC
  Administered 2022-05-24: 12 mg via SUBCUTANEOUS
  Filled 2022-05-24: qty 0.6

## 2022-05-24 NOTE — TOC Progression Note (Signed)
Transition of Care [facility]) - Progression Note    Patient Details  Name: Deborah [name] MRN: [id number] Date of Birth: 01-20-1966  Transition of Care [facility]) CM/SW [place], [place] Phone Number: 05/24/2022, 11:11 AM  Clinical Narrative:    CSW reviewed chart and noted pt had been denied by insurance on 3/14 due to uncontrolled pain. Per MD, Pt still with dropping Hemoglobin and possible GI bleed, not medically ready to start auth again. Pt participated with PT today, she is limiting, but did attempt. Pt chose [facility], attempt will be made to place again when closer to DC. TOC will continue to follow.    Expected Discharge Plan: [place] Barriers to Discharge: Continued Medical Work up  Expected Discharge Plan and Services In-house Referral: NA Discharge Planning Services: CM Consult Post Acute Care Choice: [place] Living arrangements for the past 2 months: Single Family Home                 DME Arranged: Bedside commode DME Agency: [employer] Date DME Agency Contacted: 05/12/22 Time DME Agency Contacted: [phone] Representative spoke with at DME Agency: Erasmo Downer HH Arranged: RN, Disease Management, PT, Social Work [employer] Agency: [place] Date [place]: 05/12/22 Time [place]: [place] Representative spoke with at [place]: [place] Determinants of Health ([place]) Interventions [place]: No Food Insecurity (05/11/2022)  Housing: Low Risk  (05/11/2022)  Transportation Needs: No Transportation Needs (05/11/2022)  Utilities: Not At Risk (05/11/2022)  Depression (PHQ2-9): Medium Risk (09/16/2021)  Tobacco Use: Low Risk  (05/10/2022)    Readmission Risk Interventions     No data to display

## 2022-05-24 NOTE — Progress Notes (Signed)
Physical Therapy Treatment Patient Details Name: Deborah [name] MRN: [id number] DOB: 02-11-1966 Today's Date: 05/24/2022   History of Present Illness 57 y.o. female with medical history significant of DM2, CKD IV, A.fib    Gastric ulcer, carcinoid tumor of the duodenum, systolic CHF EF [id number], HTN    Admitted for   HHS, leg pain possible sciatica    PT Comments    Pt received in bed, reluctantly agreeing to participate stating "I don't want to lose my physical therapy here." Reporting pain BLE with even slight touch and unwilling to allow therapist to provide physical assist with mobility or exercises. With exercises, pt only able to complete ankle pumps through a very small arc of movement. When asked to bend her knee, pt states "I can't do that." Pt transitioned to/from EOB with min guard assist and significantly increased time. She tolerated sitting EOB x 5 minutes. Unwilling to attempt lateral scoots EOB, repeatedly stating "I can't." Therapist limited in the amount of encouragement able to be provided due to pt easily agitated. Pt supine in bed at end of session.    Recommendations for follow up therapy are one component of a multi-disciplinary discharge planning process, led by the attending physician.  Recommendations may be updated based on patient status, additional functional criteria and insurance authorization.  Follow Up Recommendations  Skilled nursing-short term rehab (<3 hours/day) Can patient physically be transported by private vehicle: No   Assistance Recommended at Discharge Frequent or constant Supervision/Assistance  Patient can return home with the following A lot of help with walking and/or transfers;A lot of help with bathing/dressing/bathroom;Assistance with cooking/housework;Assist for transportation;Help with stairs or ramp for entrance   Equipment Recommendations  BSC/3in1 (bari drop-arm)    Recommendations for Other Services       Precautions /  Restrictions Precautions Precautions: Fall     Mobility  Bed Mobility Overal bed mobility: Needs Assistance Bed Mobility: Sidelying to Sit, Sit to Sidelying, Rolling Rolling: Modified independent (Device/Increase time) Sidelying to sit: Min guard, HOB elevated     Sit to sidelying: Min assist, HOB elevated General bed mobility comments: +rail, increased time, frequent verbal cues to stay on task. Pt unwilling to accept physical assist from therapist, reporting pain with even slight touch BLE    Transfers                   General transfer comment: Declining OOB. Pt unwilling to attempt lateral scoots EOB, repeatedly stating "I can't."    Ambulation/Gait                   Stairs             Wheelchair Mobility    Modified Rankin (Stroke Patients Only)       Balance Overall balance assessment: Needs assistance Sitting-balance support: Bilateral upper extremity supported, Feet supported Sitting balance-Leahy Scale: Fair Sitting balance - Comments: Sat EOB min guard x 5 minutes, alternating between upright sit and propping on L elbow                                    Cognition Arousal/Alertness: Awake/alert Behavior During Therapy: WFL for tasks assessed/performed Overall Cognitive Status: No family/caregiver present to determine baseline cognitive functioning  General Comments: Easily agitated. Circular conversation. Self limiting.        Exercises General Exercises - Lower Extremity Ankle Circles/Pumps: AROM, Both, 5 reps, Supine, Seated    General Comments General comments (skin integrity, edema, etc.): Unwilling to put on a gown for session, stating "I am alergic." Continual cues/assist to keep covered with blanket as pt kept removing it.      Pertinent Vitals/Pain Pain Assessment Pain Assessment: 0-10 Pain Score: 10-Worst pain ever Pain Location: BLE Pain Descriptors /  Indicators: Discomfort, Spasm, Grimacing, Guarding Pain Intervention(s): Limited activity within patient's tolerance, Monitored during session, Repositioned    Home Living                          Prior Function            PT Goals (current goals can now be found in the care plan section) Acute Rehab PT Goals Patient Stated Goal: decrease pain Progress towards PT goals: Not progressing toward goals - comment (pain, self limiting)    Frequency    Min 2X/week      PT Plan Current plan remains appropriate    Co-evaluation              AM-PAC PT "6 Clicks" Mobility   Outcome Measure  Help needed turning from your back to your side while in a flat bed without using bedrails?: A Little Help needed moving from lying on your back to sitting on the side of a flat bed without using bedrails?: A Little Help needed moving to and from a bed to a chair (including a wheelchair)?: Total Help needed standing up from a chair using your arms (e.g., wheelchair or bedside chair)?: Total Help needed to walk in hospital room?: Total Help needed climbing 3-5 steps with a railing? : Total 6 Click Score: 10    End of Session   Activity Tolerance: Patient limited by pain;Other (comment) (self limiting) Patient left: with call bell/phone within reach;in bed Nurse Communication: Mobility status PT Visit Diagnosis: Muscle weakness (generalized) (M62.81);Pain Pain - part of body: Leg     Time: [id number] PT Time Calculation (min) (ACUTE ONLY): 30 min  Charges:  $Therapeutic Activity: 23-37 mins                     Gloriann Loan., PT  Office # [phone]    Lorriane Shire 05/24/2022, 10:36 AM

## 2022-05-24 NOTE — Progress Notes (Signed)
PROGRESS NOTE  Deborah [name] [id number] DOB: 27-Apr-1965 DOA: 05/10/2022 PCP: Cena Benton, MD   LOS: 13 days   Brief Narrative / Interim history: 57 year old female with history of morbid obesity, spinal stenosis, uncontrolled DM2 with gastroparesis, CKD stage IV, PAF, HTN, duodenal carcinoid tumor who came into the hospital with abdominal pain, nausea, vomiting.  She was found to have a blood sugar of 540 nonketotic hyperglycemia.  She was started on insulin and admitted to the hospital.  She also reported worsening lower extremity and back pain.  Subjective / 24h Interval events: Feels well.  She has been taking her oral laxatives but refused enema last night.  Still has not had a bowel movement  Assesement and Plan: Principal Problem:   Hyperosmolar non-ketotic state due to type 2 diabetes mellitus ([place]) Active Problems:   Spinal stenosis, lumbar region with neurogenic claudication   Leukocytosis   PAF (paroxysmal atrial fibrillation) (HCC)   Chronic kidney disease (CKD), stage IV (severe) (HCC)   Essential hypertension, benign   Malignant carcinoid tumor of duodenum (HCC)   Normocytic anemia   Hypomagnesemia and hypokalemia   GERD (gastroesophageal reflux disease)   Morbid obesity (HCC)   Anemia  Principal problem Normocytic anemia, iron deficiency anemia, anemia of CKD, concern for chronic blood loss anemia with a GI source -patient received total of 2 units of packed red blood cells, and hemoglobin continued to trend down.  GI consulted on 3/15, however with lack of bowel movement and hemoglobin stability they signed off 3/16.  Eliquis was on hold for 2 days, but now has been resumed on 3/17, and hemoglobin again is trending down.  Due to her severe narcotic induced constipation refractory to laxatives, with give Relistor today.  Will need to send fecal occult, if positive I will reconsult GI  Active problems Spinal stenosis, lumbar region with neurogenic  claudication -she has been wheelchair-bound for several months, now has worsening pain in the lower extremities more so on the right.  Lumbar spine MRI was done on 3/6 which showed lower lumbar spondylosis worsening since 2019, worst at L4-5, with facet arthropathy with joint effusion and periarticular edema and unchanged severe right neuroforaminal narrowing at L5-S1.  Neurosurgery evaluated patient and did not recommend surgical approach at this point but continued PT, outpatient follow-up -Pain is now controlled with oral agents, she is on a combination of MS Contin BID alternating with oral Dilaudid  Hyperosmolar nonketotic state due to poorly controlled DM2, with hyperglycemia -fairly well-controlled now on glargine as well as scheduled insulin plus sliding scale.  Continue regimen as below.  A1c was 10.  Had another episode of hypoglycemia last night and her long-acting insulin was discontinued.  Monitor CBGs  CBG (last 3)  Recent Labs    05/23/22 2023 05/24/22 0507 05/24/22 0814  GLUCAP 92 123* 140*    Abdominal pain, nausea, vomiting -intermittent in the setting of gastroparesis, now appears resolved  PAF-continue metoprolol, challenge her with Eliquis this morning  CKD, stage IV-baseline around 3, currently at baseline.  Continue furosemide  Duodenal malignant carcinoid tumor-outpatient follow-up with oncology, apparently she had surgery few years back  Leukocytosis-no clear-cut source of infection, monitor off antibiotics, she is afebrile.  Procalcitonin was low at 0.12.  UA negative, CT chest, abdomen, pelvis negative for apparent infections   Scheduled Meds:  apixaban  5 mg Oral BID   atorvastatin  10 mg Oral Daily   cyanocobalamin  1,000 mcg Intramuscular Daily   diclofenac Sodium  4 g Topical QID   DULoxetine  40 mg Oral BID   famotidine  20 mg Oral Daily   ferrous sulfate  325 mg Oral BID WC   furosemide  80 mg Oral TID   insulin aspart  0-20 Units Subcutaneous TID WC    insulin aspart  0-5 Units Subcutaneous QHS   insulin aspart  8 Units Subcutaneous TID WC   loratadine  10 mg Oral Daily   metoprolol succinate  50 mg Oral Daily   morphine  15 mg Oral Q12H   multivitamin with minerals  1 tablet Oral Daily   pantoprazole  40 mg Oral BID   sodium bicarbonate  650 mg Oral BID   Continuous Infusions: PRN Meds:.albuterol, dextrose, diazepam, HYDROmorphone, melatonin, ondansetron (ZOFRAN) IV  Current Outpatient Medications  Medication Instructions   albuterol (PROVENTIL) 2.5 mg, Nebulization, Every 6 hours PRN   apixaban (ELIQUIS) 5 mg, Oral, 2 times daily   atorvastatin (LIPITOR) 10 mg, Oral, Daily   cetirizine (ZYRTEC) 10 MG tablet TAKE 1 TABLET (10 MG TOTAL) BY MOUTH DAILY (AM)   DULoxetine HCl 40 mg, Oral, 2 times daily   EASY COMFORT PEN NEEDLES 31G X 5 MM MISC USE 3 TIMES A DAY FOR INSULIN ADMINISTRATION   famotidine (PEPCID) 20 mg, Oral, Daily   fluticasone (FLONASE) 50 MCG/ACT nasal spray 2 sprays, Each Nare, Daily PRN   folic acid (FOLVITE) 1 mg, Oral, Daily   HumaLOG KwikPen 7 Units, Subcutaneous, 3 times daily with meals   hydrALAZINE (APRESOLINE) 25 mg, Oral, 3 times daily   HYDROmorphone (DILAUDID) 4 MG tablet TAKE 1 TABLET (4 MG TOTAL) BY MOUTH 2 (TWO) TIMES DAILY. MAY TAKE AN EXTRA TABLET WHEN PAIN IS SEVERE 20 DAYS OUT OF THE MONTH  FILL ON 04/02/2022    isosorbide mononitrate (IMDUR) 15 mg, Oral, Daily   Lantus SoloStar 35 Units, Subcutaneous, Daily at bedtime   magnesium chloride (SLOW-MAG) 64 MG TBEC SR tablet 1 tablet, Oral, Daily   metoprolol succinate (TOPROL-XL) 50 mg, Oral, Daily, Take with or immediately following a meal.   ondansetron (ZOFRAN ODT) 4 mg, Oral, Every 8 hours PRN   pantoprazole (PROTONIX) 40 MG tablet TAKE 1 TABLET BY MOUTH 2 (TWO) TIMES DAILY. (AM+BEDTIME)   SENNA PLUS 8.6-50 MG tablet 1 tablet, Daily   sodium bicarbonate 650 mg, Oral, 2 times daily   torsemide (DEMADEX) 50 mg, Oral, 2 times daily   Vitamin D  (Ergocalciferol) (DRISDOL) 50,000 Units, Oral, Every Mon    Diet Orders (From admission, onward)     Start     Ordered   05/11/22 1110  Diet Carb Modified Fluid consistency: Thin; Room service appropriate? Yes  Diet effective now       Question Answer Comment  Diet-HS Snack? Nothing   Calorie Level Medium 1600-2000   Fluid consistency: Thin   Room service appropriate? Yes      05/11/22 1110            DVT prophylaxis:  apixaban (ELIQUIS) tablet 5 mg   Lab Results  Component Value Date   PLT 620 (H) 05/24/2022      Code Status: Full Code  Family Communication: no family at bedside   Status is: Inpatient  Remains inpatient appropriate because: severity of illness  Level of care: Med-Surg  Consultants:  Neurosurgery   Objective: Vitals:   05/23/22 1442 05/23/22 1941 05/24/22 0503 05/24/22 0812  BP: 115/63 108/62 (!) 129/49 (!) 124/101  Pulse: (!) 102 95 97  98  Resp: 19 15 15 20   Temp: 98.1 F (36.7 C) 98.2 F (36.8 C) 97.8 F (36.6 C) 98 F (36.7 C)  TempSrc: Oral Axillary Axillary Oral  SpO2: 100% 96% 94% 100%  Weight:      Height:        Intake/Output Summary (Last 24 hours) at 05/24/2022 1028 Last data filed at 05/24/2022 1014 Gross per 24 hour  Intake 400 ml  Output 1000 ml  Net -600 ml    Wt Readings from Last 3 Encounters:  05/13/22 (!) 139.2 kg  03/20/22 115.2 kg  08/27/21 132.8 kg    Examination:  Constitutional: NAD Eyes: lids and conjunctivae normal, no scleral icterus ENMT: mmm Neck: normal, supple Respiratory: clear to auscultation bilaterally, no wheezing, no crackles. Normal respiratory effort.  Cardiovascular: Regular rate and rhythm, no murmurs / rubs / gallops. No LE edema. Abdomen: soft, no distention, no tenderness. Bowel sounds positive.   Data Reviewed: I have independently reviewed following labs and imaging studies   CBC Recent Labs  Lab 05/18/22 0910 05/19/22 0657 05/19/22 2035 05/21/22 0551 05/22/22 0849  05/23/22 0405 05/24/22 0458  WBC 14.6*   < > 15.1* 13.5* 15.8* 16.4* 15.7*  HGB 7.2*   < > 7.7* 7.1* 7.4* 7.5* 7.3*  HCT 22.6*   < > 23.9* 23.2* 24.4* 24.4* 23.2*  PLT 532*   < > 535* 583* 644* 662* 620*  MCV 89.0   < > 84.2 86.6 87.1 87.1 85.6  MCH 28.3   < > 27.1 26.5 26.4 26.8 26.9  MCHC 31.9   < > 32.2 30.6 30.3 30.7 31.5  RDW 14.6   < > 17.8* 18.4* 18.5* 18.4* 18.9*  LYMPHSABS 1.2  --   --   --   --   --   --   MONOABS 0.6  --   --   --   --   --   --   EOSABS 0.3  --   --   --   --   --   --   BASOSABS 0.1  --   --   --   --   --   --    < > = values in this interval not displayed.     Recent Labs  Lab 05/19/22 0657 05/21/22 0551 05/22/22 0849 05/23/22 0405 05/24/22 0458  NA 131* 132* 132* 131* 132*  K 3.9 3.9 4.2 4.1 4.2  CL 97* 98 97* 96* 97*  CO2 22 21* 24 23 23   GLUCOSE 121* 157* 263* 198* 138*  BUN 83* 84* 86* 88* 90*  CREATININE 3.23* 3.12* 3.51* 3.22* 3.08*  CALCIUM 8.8* 8.9 9.1 9.3 9.2  AST  --   --   --  20  --   ALT  --   --   --  24  --   ALKPHOS  --   --   --  94  --   BILITOT  --   --   --  0.8  --   ALBUMIN  --   --   --  2.0*  --   MG  --   --   --  1.9  --      ------------------------------------------------------------------------------------------------------------------ No results for input(s): "CHOL", "HDL", "LDLCALC", "TRIG", "CHOLHDL", "LDLDIRECT" in the last 72 hours.  Lab Results  Component Value Date   HGBA1C 10.1 (H) 03/21/2022   ------------------------------------------------------------------------------------------------------------------ No results for input(s): "TSH", "T4TOTAL", "T3FREE", "THYROIDAB" in the last 72 hours.  Invalid input(s): "FREET3"  Cardiac Enzymes No results for input(s): "CKMB", "TROPONINI", "MYOGLOBIN" in the last 168 hours.  Invalid input(s): "CK" ------------------------------------------------------------------------------------------------------------------    Component Value Date/Time   BNP  24.5 05/10/2022 2038    CBG: Recent Labs  Lab 05/23/22 1656 05/23/22 1934 05/23/22 2023 05/24/22 0507 05/24/22 0814  GLUCAP 87 60* 92 123* 140*     No results found for this or any previous visit (from the past 240 hour(s)).    Radiology Studies: No results found.   Marzetta Board, MD, PhD [facility]  Between 7 am - 7 pm I am available, please contact me via Amion (for emergencies) or Securechat (non urgent messages)  Between 7 pm - 7 am I am not available, please contact night coverage MD/APP via Amion

## 2022-05-24 NOTE — Inpatient Diabetes Management (Signed)
Inpatient Diabetes Program Recommendations  AACE/ADA: New Consensus Statement on Inpatient Glycemic Control  Target Ranges:  Prepandial:   less than 140 mg/dL      Peak postprandial:   less than 180 mg/dL (1-2 hours)      Critically ill patients:  140 - 180 mg/dL    Latest Reference Range & Units 05/24/22 05:07 05/24/22 08:14  Glucose-Capillary 70 - 99 mg/dL 123 (H) 140 (H)  Novolog 11 units    Latest Reference Range & Units 05/23/22 08:16 05/23/22 10:25 05/23/22 11:34 05/23/22 12:53 05/23/22 16:56 05/23/22 19:34 05/23/22 20:23  Glucose-Capillary 70 - 99 mg/dL 290 (H)     Novolog 19 units @10 :25  Semglee 40 units @10 :32 297 (H)   Novolog 19 units @12 :53 87 60 (L) 92   Review of Glycemic Control  Diabetes history: DM2 Outpatient Diabetes medications: Lantus 50 units QHS, Humalog 30 units QID Current orders for Inpatient glycemic control: Novolog 8 units TID with meals, Novolog 0-20 units TID with meals, Novolog 0-5 units QHS  Inpatient Diabetes Program Recommendations:    Insulin: Noted that patient was ordered Semglee 40 units (last given at 10:32 am on 05/23/22) which has been discontinued. Noted glucose down to 60 mg/dl at 19:34 on 3/17. Anticipate hypoglycemia due to insulin stacking (patient received Novolog 19 units at 10:25 and Novolog 19 units at 12:53). Please consider reordering Semglee 40 units daily.  Thanks, Barnie Alderman, RN, MSN, [place] Diabetes Coordinator Inpatient Diabetes Program [phone] (Team Pager from 8am to [place])

## 2022-05-24 NOTE — Plan of Care (Signed)
Patient AOX2-3, forgetful and confused at baseline.  Disoriented to date and situation.  All meds given on time as ordered.  Pt c/o pain relieved by PRN oxycodone.  Blood sugar was 60 last night, pt remained asymptomatic.  Pt refused juice, cola given.  Blood sugar recheck was WDLs.  Provider notified.  Diminished lungs, IS encouraged.  Purewick in place.  POC maintained, will continue to monitor.  Problem: Education: Goal: Knowledge of General Education information will improve Description: Including pain rating scale, medication(s)/side effects and non-pharmacologic comfort measures Outcome: Progressing   Problem: Health Behavior/Discharge Planning: Goal: Ability to manage health-related needs will improve Outcome: Progressing   Problem: Clinical Measurements: Goal: Will remain free from infection Outcome: Progressing Goal: Diagnostic test results will improve Outcome: Progressing Goal: Respiratory complications will improve Outcome: Progressing Goal: Cardiovascular complication will be avoided Outcome: Progressing   Problem: Activity: Goal: Risk for activity intolerance will decrease Outcome: Progressing   Problem: Nutrition: Goal: Adequate nutrition will be maintained Outcome: Progressing   Problem: Coping: Goal: Level of anxiety will decrease Outcome: Progressing   Problem: Elimination: Goal: Will not experience complications related to bowel motility Outcome: Progressing Goal: Will not experience complications related to urinary retention Outcome: Progressing   Problem: Pain Managment: Goal: General experience of comfort will improve Outcome: Progressing   Problem: Safety: Goal: Ability to remain free from injury will improve Outcome: Progressing   Problem: Skin Integrity: Goal: Risk for impaired skin integrity will decrease Outcome: Progressing   Problem: Education: Goal: Ability to describe self-care measures that may prevent or decrease complications  (Diabetes Survival Skills Education) will improve Outcome: Progressing Goal: Individualized Educational Video(s) Outcome: Progressing   Problem: Coping: Goal: Ability to adjust to condition or change in health will improve Outcome: Progressing   Problem: Fluid Volume: Goal: Ability to maintain a balanced intake and output will improve Outcome: Progressing   Problem: Health Behavior/Discharge Planning: Goal: Ability to identify and utilize available resources and services will improve Outcome: Progressing Goal: Ability to manage health-related needs will improve Outcome: Progressing   Problem: Metabolic: Goal: Ability to maintain appropriate glucose levels will improve Outcome: Progressing   Problem: Nutritional: Goal: Maintenance of adequate nutrition will improve Outcome: Progressing Goal: Progress toward achieving an optimal weight will improve Outcome: Progressing   Problem: Skin Integrity: Goal: Risk for impaired skin integrity will decrease Outcome: Progressing   Problem: Tissue Perfusion: Goal: Adequacy of tissue perfusion will improve Outcome: Progressing

## 2022-05-25 DIAGNOSIS — E1100 Type 2 diabetes mellitus with hyperosmolarity without nonketotic hyperglycemic-hyperosmolar coma (NKHHC): Secondary | ICD-10-CM | POA: Diagnosis not present

## 2022-05-25 LAB — CBC
HCT: 24.3 % — ABNORMAL LOW (ref 36.0–46.0)
Hemoglobin: 7.5 g/dL — ABNORMAL LOW (ref 12.0–15.0)
MCH: 26.8 pg (ref 26.0–34.0)
MCHC: 30.9 g/dL (ref 30.0–36.0)
MCV: 86.8 fL (ref 80.0–100.0)
Platelets: 657 10*3/uL — ABNORMAL HIGH (ref 150–400)
RBC: 2.8 MIL/uL — ABNORMAL LOW (ref 3.87–5.11)
RDW: 18.8 % — ABNORMAL HIGH (ref 11.5–15.5)
WBC: 17.7 10*3/uL — ABNORMAL HIGH (ref 4.0–10.5)
nRBC: 0 % (ref 0.0–0.2)

## 2022-05-25 LAB — GLUCOSE, CAPILLARY
Glucose-Capillary: 210 mg/dL — ABNORMAL HIGH (ref 70–99)
Glucose-Capillary: 207 mg/dL — ABNORMAL HIGH (ref 70–99)
Glucose-Capillary: 205 mg/dL — ABNORMAL HIGH (ref 70–99)
Glucose-Capillary: 124 mg/dL — ABNORMAL HIGH (ref 70–99)
Glucose-Capillary: 96 mg/dL (ref 70–99)

## 2022-05-25 LAB — BASIC METABOLIC PANEL
Anion gap: 14 (ref 5–15)
BUN: 93 mg/dL — ABNORMAL HIGH (ref 6–20)
CO2: 22 mmol/L (ref 22–32)
Calcium: 9.3 mg/dL (ref 8.9–10.3)
Chloride: 94 mmol/L — ABNORMAL LOW (ref 98–111)
Creatinine, Ser: 3.36 mg/dL — ABNORMAL HIGH (ref 0.44–1.00)
GFR, Estimated: 15 mL/min — ABNORMAL LOW (ref >60)
Glucose, Bld: 241 mg/dL — ABNORMAL HIGH (ref 70–99)
Potassium: 4.6 mmol/L (ref 3.5–5.1)
Sodium: 130 mmol/L — ABNORMAL LOW (ref 135–145)

## 2022-05-25 LAB — MAGNESIUM: Magnesium: 2 mg/dL (ref 1.7–2.4)

## 2022-05-25 MED ORDER — FUROSEMIDE 40 MG PO TABS
80.0000 mg | ORAL_TABLET | Freq: Every day | ORAL | Status: DC
  Administered 2022-05-25 – 2022-05-27 (×3): 80 mg via ORAL
  Filled 2022-05-25 (×3): qty 2

## 2022-05-25 MED ORDER — INSULIN GLARGINE-YFGN 100 UNIT/ML ~~LOC~~ SOLN
15.0000 [IU] | Freq: Every day | SUBCUTANEOUS | Status: DC
  Administered 2022-05-25: 15 [IU] via SUBCUTANEOUS
  Filled 2022-05-25 (×3): qty 0.15

## 2022-05-25 NOTE — TOC Progression Note (Addendum)
Transition of Care [facility]) - Progression Note    Patient Details  Name: Deborah [name] MRN: [id number] Date of Birth: 1965/05/08  Transition of Care [facility]) CM/SW [place], [place] Phone Number: 05/25/2022, 11:18 AM  Clinical Narrative:     CSW informed pt stable for DC and auth can be restarted. TOC CMA initiated auth request. Auth ID [id number]; Status:pending  1430: Initial auth request denied. Peer review offered. Attending called [phone]  and completed peer review. Insurance would like to see updated PT note with more patient participation. Deadline for new note is tomorrow at 4pm. [facility] will follow and submit updated PT note once available.    Expected Discharge Plan: Skilled Nursing Facility Barriers to Discharge: Insurance Authorization  Expected Discharge Plan and Services In-house Referral: NA Discharge Planning Services: CM Consult Post Acute Care Choice: [place] Living arrangements for the past 2 months: Single Family Home                 DME Arranged: Bedside commode DME Agency: [employer] Date DME Agency Contacted: 05/12/22 Time DME Agency Contacted: [phone] Representative spoke with at DME Agency: Erasmo Downer HH Arranged: RN, Disease Management, PT, Social Work [employer] Agency: [place] Date [place]: 05/12/22 Time [place]: [place] Representative spoke with at [place]: [place] Determinants of Health ([place]) Interventions [place]: No Food Insecurity (05/11/2022)  Housing: Low Risk  (05/11/2022)  Transportation Needs: No Transportation Needs (05/11/2022)  Utilities: Not At Risk (05/11/2022)  Depression (PHQ2-9): Medium Risk (09/16/2021)  Tobacco Use: Low Risk  (05/10/2022)    Readmission Risk Interventions     No data to display

## 2022-05-25 NOTE — Progress Notes (Signed)
Occupational Therapy Treatment Patient Details Name: Deborah [name] MRN: [id number] DOB: [birth year] Today's Date: 05/25/2022   History of present illness 57 y.o. female with medical history significant of DM2, CKD IV, A.fib    Gastric ulcer, carcinoid tumor of the duodenum, systolic CHF EF [id number], HTN    Admitted for   HHS, leg pain possible sciatica   OT comments  Pt was initially agreeable to participate and able to transfer from supine>sitting with min G and increased time and donned a gown with set up A. Upon sitting pt complained of bilateral hip pain and needed a "rest break," initially asking for 10 minutes but compromised with 2 minutes. However once break was over, pt declined participation in all tasks offered (self care, exercises, standing, PROM, stretching, etc.). Educated and encourage pt to pick 1 goal that she wishes to work towards with therapy, she identified "to get stronger," as important to her but became agitated as therapist was determining ways to work towards that self-directed goal. During conversation pt complete 5x L lateral leans to elbow with push up - attempted to encourage her to complete 5x to the R she declined. Acute OT goals updated, d/c to SNF remains appropriate.   Recommendations for follow up therapy are one component of a multi-disciplinary discharge planning process, led by the attending physician.  Recommendations may be updated based on patient status, additional functional criteria and insurance authorization.    Follow Up Recommendations  Skilled nursing-short term rehab (<3 hours/day)     Assistance Recommended at Discharge Frequent or constant Supervision/Assistance  Patient can return home with the following  Assist for transportation;Assistance with cooking/housework;A lot of help with walking and/or transfers;A lot of help with bathing/dressing/bathroom   Equipment Recommendations  BSC/3in1       Precautions / Restrictions  Precautions Precautions: Fall Restrictions Weight Bearing Restrictions: No       Mobility Bed Mobility Overal bed mobility: Needs Assistance Bed Mobility: Supine to Sit, Sit to Supine     Supine to sit: Min guard Sit to supine: Min guard   General bed mobility comments: rail + increased time    Transfers                   General transfer comment: pt declined     Balance Overall balance assessment: Needs assistance Sitting-balance support: Bilateral upper extremity supported Sitting balance-Leahy Scale: Fair Sitting balance - Comments: Sat EOB min guard x 15 minutes, alternating between upright sit and propping on L elbow                                   ADL either performed or assessed with clinical judgement   ADL Overall ADL's : Needs assistance/impaired Eating/Feeding: Set up;Sitting Eating/Feeding Details (indicate cue type and reason): may benefit from built up utencils             Upper Body Dressing : Set up;Min guard;Sitting Upper Body Dressing Details (indicate cue type and reason): donned gown, encouragement and increased time needed                   General ADL Comments: declined participation in self care, or simulated practice    Extremity/Trunk Assessment Upper Extremity Assessment Upper Extremity Assessment: RUE deficits/detail;LUE deficits/detail RUE Deficits / Details: self reports painful hands with, poor grip strength and has been dropping things out of her R hand. Declines ROM/MMT  assessment RUE Sensation: WNL LUE Deficits / Details: reports shoulder and elbow "are fine," but her hands hurt. Declined ROM/MMT   Lower Extremity Assessment Lower Extremity Assessment: Defer to PT evaluation        Vision   Vision Assessment?: No apparent visual deficits   Perception Perception Perception: Not tested   Praxis Praxis Praxis: Not tested    Cognition Arousal/Alertness: Awake/alert Behavior During  Therapy: Flat affect Overall Cognitive Status: No family/caregiver present to determine baseline cognitive functioning                                 General Comments: Flat affect, seemingly ignoring therapist throughout. self-limiting and becomes agitated with encouragement/education. Focused session on attempting to gain a self-driected goal from pt, all she would state is "to get stronger," with no initiation on what that emcompasses. Pt then agreeable that she would like to become more indep with bathing.              General Comments VSS on RA    Pertinent Vitals/ Pain       Pain Assessment Pain Assessment: 0-10 Pain Score: 9  Faces Pain Scale: Hurts little more Pain Location: bilat hips Pain Descriptors / Indicators: Discomfort, Spasm, Grimacing, Guarding Pain Intervention(s): Limited activity within patient's tolerance, Monitored during session   Frequency  Min 2X/week        Progress Toward Goals  OT Goals(current goals can now be found in the care plan section)  Progress towards OT goals: Not progressing toward goals - comment (self-limiting)  Acute Rehab OT Goals Patient Stated Goal: "to get stronger" OT Goal Formulation: With patient Time For Goal Achievement: 06/08/22 Potential to Achieve Goals: Fair ADL Goals Pt Will Perform Grooming: with supervision;sitting Pt Will Perform Lower Body Dressing: with min assist;with adaptive equipment;sit to/from stand Pt Will Transfer to Toilet: with min assist;stand pivot transfer;bedside commode Pt/caregiver will Perform Home Exercise Program: Increased ROM;Increased strength;Both right and left upper extremity;With written HEP provided;With Supervision  Plan Frequency remains appropriate;Discharge plan remains appropriate       AM-PAC OT "6 Clicks" Daily Activity     Outcome Measure   Help from another person eating meals?: A Little Help from another person taking care of personal grooming?: A  Little Help from another person toileting, which includes using toliet, bedpan, or urinal?: A Lot Help from another person bathing (including washing, rinsing, drying)?: A Lot Help from another person to put on and taking off regular upper body clothing?: A Little Help from another person to put on and taking off regular lower body clothing?: A Lot 6 Click Score: 15    End of Session    OT Visit Diagnosis: Unsteadiness on feet (R26.81);Pain   Activity Tolerance Other (comment) (self-limiting)   Patient Left in bed;with call bell/phone within reach   Nurse Communication Mobility status        Time: 1524-1550 OT Time Calculation (min): 26 min  Charges: OT General Charges $OT Visit: 1 Visit OT Treatments $Therapeutic Activity: 23-37 mins  Shade Flood, OTR/L [place] [facility] [phone] Secure Chat Communication Preferred   Elliot Cousin 05/25/2022, 4:52 PM

## 2022-05-25 NOTE — Plan of Care (Signed)
Patient AOX2-3, forgetful and confused at baseline. Disoriented to date and situation. All meds given on time as ordered. Pt denied pain.  Diminished lungs, IS encouraged. Purewick in place. POC maintained, will continue to monitor   Problem: Education: Goal: Knowledge of General Education information will improve Description: Including pain rating scale, medication(s)/side effects and non-pharmacologic comfort measures Outcome: Progressing   Problem: Health Behavior/Discharge Planning: Goal: Ability to manage health-related needs will improve Outcome: Progressing   Problem: Clinical Measurements: Goal: Will remain free from infection Outcome: Progressing Goal: Diagnostic test results will improve Outcome: Progressing Goal: Respiratory complications will improve Outcome: Progressing Goal: Cardiovascular complication will be avoided Outcome: Progressing   Problem: Activity: Goal: Risk for activity intolerance will decrease Outcome: Progressing   Problem: Nutrition: Goal: Adequate nutrition will be maintained Outcome: Progressing   Problem: Coping: Goal: Level of anxiety will decrease Outcome: Progressing   Problem: Elimination: Goal: Will not experience complications related to bowel motility Outcome: Progressing Goal: Will not experience complications related to urinary retention Outcome: Progressing   Problem: Pain Managment: Goal: General experience of comfort will improve Outcome: Progressing   Problem: Safety: Goal: Ability to remain free from injury will improve Outcome: Progressing   Problem: Skin Integrity: Goal: Risk for impaired skin integrity will decrease Outcome: Progressing   Problem: Education: Goal: Ability to describe self-care measures that may prevent or decrease complications (Diabetes Survival Skills Education) will improve Outcome: Progressing Goal: Individualized Educational Video(s) Outcome: Progressing   Problem: Coping: Goal: Ability  to adjust to condition or change in health will improve Outcome: Progressing   Problem: Fluid Volume: Goal: Ability to maintain a balanced intake and output will improve Outcome: Progressing   Problem: Health Behavior/Discharge Planning: Goal: Ability to identify and utilize available resources and services will improve Outcome: Progressing Goal: Ability to manage health-related needs will improve Outcome: Progressing   Problem: Metabolic: Goal: Ability to maintain appropriate glucose levels will improve Outcome: Progressing   Problem: Nutritional: Goal: Maintenance of adequate nutrition will improve Outcome: Progressing Goal: Progress toward achieving an optimal weight will improve Outcome: Progressing   Problem: Skin Integrity: Goal: Risk for impaired skin integrity will decrease Outcome: Progressing   Problem: Tissue Perfusion: Goal: Adequacy of tissue perfusion will improve Outcome: Progressing

## 2022-05-25 NOTE — Progress Notes (Signed)
PROGRESS NOTE  Deborah [name] [id number] DOB: 1965/07/23 DOA: 05/10/2022 PCP: Cena Benton, MD   LOS: 14 days   Brief Narrative / Interim history: 57 year old female with history of morbid obesity, spinal stenosis, uncontrolled DM2 with gastroparesis, CKD stage IV, PAF, HTN, duodenal carcinoid tumor who came into the hospital with abdominal pain, nausea, vomiting.  She was found to have a blood sugar of 540 nonketotic hyperglycemia.  She was started on insulin and admitted to the hospital.  She also reported worsening lower extremity and back pain.  Hospital course complicated by recurrent anemia requiring 2 units of packed red blood cells.  GI consulted and her Eliquis was held, but hemoglobin has remained stable and GI signed off.  Eliquis was resumed and hemoglobin has remained stable, and fecal occult was negative  Subjective / 24h Interval events: Feels well, pain is controlled.  Assesement and Plan: Principal Problem:   Hyperosmolar non-ketotic state due to type 2 diabetes mellitus ([place]) Active Problems:   Spinal stenosis, lumbar region with neurogenic claudication   Leukocytosis   PAF (paroxysmal atrial fibrillation) (HCC)   Chronic kidney disease (CKD), stage IV (severe) (HCC)   Essential hypertension, benign   Malignant carcinoid tumor of duodenum (HCC)   Normocytic anemia   Hypomagnesemia and hypokalemia   GERD (gastroesophageal reflux disease)   Morbid obesity (HCC)   Anemia  Principal problem Normocytic anemia, iron deficiency anemia, anemia of CKD, concern for chronic blood loss anemia with a GI source -patient received total of 2 units of packed red blood cells, and hemoglobin continued to trend down.  GI consulted on 3/15, however with lack of bowel movement and hemoglobin stability they signed off 3/16.  Eliquis was on hold for 2 days, but now has been resumed on 3/17, and hemoglobin is now stable.  Due to her severe narcotic induced constipation  refractory to laxatives, she received Relistor 3/18.  Resume laxatives tomorrow.  Fecal occult was negative  Active problems Spinal stenosis, lumbar region with neurogenic claudication -she has been wheelchair-bound for several months, now has worsening pain in the lower extremities more so on the right.  Lumbar spine MRI was done on 3/6 which showed lower lumbar spondylosis worsening since 2019, worst at L4-5, with facet arthropathy with joint effusion and periarticular edema and unchanged severe right neuroforaminal narrowing at L5-S1.  Neurosurgery evaluated patient and did not recommend surgical approach at this point but continued PT, outpatient follow-up -Pain is now controlled with oral agents, she is on a combination of MS Contin BID alternating with oral Dilaudid -Discussed with TOC, now that the hemoglobin is stable and pain is controlled with oral agents insurance authorization will be started today for SNF placement  Hyperosmolar nonketotic state due to poorly controlled DM2, with hyperglycemia -fairly well-controlled now on glargine as well as scheduled insulin plus sliding scale.  Continue regimen as below.  A1c was 10.  Had another episode of hypoglycemia last night and her long-acting insulin was discontinued.  CBGs in the 200 this morning, resume long-acting at a lower dose  CBG (last 3)  Recent Labs    05/24/22 2013 05/25/22 0631 05/25/22 0744  GLUCAP 174* 210* 207*    Abdominal pain, nausea, vomiting -intermittent in the setting of gastroparesis, now appears resolved  PAF-continue metoprolol, continue Eliquis  CKD, stage IV-baseline around 3, currently at baseline.  Continue furosemide  Duodenal malignant carcinoid tumor-outpatient follow-up with oncology, apparently she had surgery few years back  Leukocytosis-no clear-cut source of infection,  monitor off antibiotics, she is afebrile.  Procalcitonin was low at 0.12.  UA negative, CT chest, abdomen, pelvis negative for  apparent infections   Scheduled Meds:  apixaban  5 mg Oral BID   atorvastatin  10 mg Oral Daily   cyanocobalamin  1,000 mcg Intramuscular Daily   diclofenac Sodium  4 g Topical QID   DULoxetine  40 mg Oral BID   famotidine  20 mg Oral Daily   ferrous sulfate  325 mg Oral BID WC   furosemide  80 mg Oral Daily   insulin aspart  0-20 Units Subcutaneous TID WC   insulin aspart  0-5 Units Subcutaneous QHS   insulin aspart  8 Units Subcutaneous TID WC   loratadine  10 mg Oral Daily   metoprolol succinate  50 mg Oral Daily   morphine  15 mg Oral Q12H   multivitamin with minerals  1 tablet Oral Daily   pantoprazole  40 mg Oral BID   sodium bicarbonate  650 mg Oral BID   Continuous Infusions: PRN Meds:.albuterol, dextrose, diazepam, HYDROmorphone, melatonin, ondansetron (ZOFRAN) IV  Current Outpatient Medications  Medication Instructions   albuterol (PROVENTIL) 2.5 mg, Nebulization, Every 6 hours PRN   apixaban (ELIQUIS) 5 mg, Oral, 2 times daily   atorvastatin (LIPITOR) 10 mg, Oral, Daily   cetirizine (ZYRTEC) 10 MG tablet TAKE 1 TABLET (10 MG TOTAL) BY MOUTH DAILY (AM)   DULoxetine HCl 40 mg, Oral, 2 times daily   EASY COMFORT PEN NEEDLES 31G X 5 MM MISC USE 3 TIMES A DAY FOR INSULIN ADMINISTRATION   famotidine (PEPCID) 20 mg, Oral, Daily   fluticasone (FLONASE) 50 MCG/ACT nasal spray 2 sprays, Each Nare, Daily PRN   folic acid (FOLVITE) 1 mg, Oral, Daily   HumaLOG KwikPen 7 Units, Subcutaneous, 3 times daily with meals   hydrALAZINE (APRESOLINE) 25 mg, Oral, 3 times daily   HYDROmorphone (DILAUDID) 4 MG tablet TAKE 1 TABLET (4 MG TOTAL) BY MOUTH 2 (TWO) TIMES DAILY. MAY TAKE AN EXTRA TABLET WHEN PAIN IS SEVERE 20 DAYS OUT OF THE MONTH  FILL ON 04/02/2022    isosorbide mononitrate (IMDUR) 15 mg, Oral, Daily   Lantus SoloStar 35 Units, Subcutaneous, Daily at bedtime   magnesium chloride (SLOW-MAG) 64 MG TBEC SR tablet 1 tablet, Oral, Daily   metoprolol succinate (TOPROL-XL) 50 mg,  Oral, Daily, Take with or immediately following a meal.   ondansetron (ZOFRAN ODT) 4 mg, Oral, Every 8 hours PRN   pantoprazole (PROTONIX) 40 MG tablet TAKE 1 TABLET BY MOUTH 2 (TWO) TIMES DAILY. (AM+BEDTIME)   SENNA PLUS 8.6-50 MG tablet 1 tablet, Daily   sodium bicarbonate 650 mg, Oral, 2 times daily   torsemide (DEMADEX) 50 mg, Oral, 2 times daily   Vitamin D (Ergocalciferol) (DRISDOL) 50,000 Units, Oral, Every Mon    Diet Orders (From admission, onward)     Start     Ordered   05/11/22 1110  Diet Carb Modified Fluid consistency: Thin; Room service appropriate? Yes  Diet effective now       Question Answer Comment  Diet-HS Snack? Nothing   Calorie Level Medium 1600-2000   Fluid consistency: Thin   Room service appropriate? Yes      05/11/22 1110            DVT prophylaxis:  apixaban (ELIQUIS) tablet 5 mg   Lab Results  Component Value Date   PLT 657 (H) 05/25/2022      Code Status: Full Code  Family  Communication: no family at bedside   Status is: Inpatient  Remains inpatient appropriate because: SNF when bed available  Level of care: Med-Surg  Consultants:  Neurosurgery   Objective: Vitals:   05/24/22 1551 05/24/22 2000 05/25/22 0545 05/25/22 0742  BP: (!) 108/43 (!) 125/49 110/64 120/75  Pulse: 75 88 92 91  Resp: 18 15 16 18   Temp: 98.2 F (36.8 C) 97.7 F (36.5 C) 98.2 F (36.8 C) 98.9 F (37.2 C)  TempSrc: Oral Axillary    SpO2:  100% 100% 100%  Weight:      Height:        Intake/Output Summary (Last 24 hours) at 05/25/2022 1105 Last data filed at 05/25/2022 0818 Gross per 24 hour  Intake 250 ml  Output 1350 ml  Net -1100 ml    Wt Readings from Last 3 Encounters:  05/13/22 (!) 139.2 kg  03/20/22 115.2 kg  08/27/21 132.8 kg    Examination:  Constitutional: NAD Eyes: lids and conjunctivae normal, no scleral icterus ENMT: mmm Neck: normal, supple Respiratory: clear to auscultation bilaterally, no wheezing, no crackles. Normal  respiratory effort.  Cardiovascular: Regular rate and rhythm, no murmurs / rubs / gallops. No LE edema. Abdomen: soft, no distention, no tenderness. Bowel sounds positive.  Skin: no rashes Neurologic: no focal deficits, equal strength  Data Reviewed: I have independently reviewed following labs and imaging studies   CBC Recent Labs  Lab 05/21/22 0551 05/22/22 0849 05/23/22 0405 05/24/22 0458 05/25/22 0310  WBC 13.5* 15.8* 16.4* 15.7* 17.7*  HGB 7.1* 7.4* 7.5* 7.3* 7.5*  HCT 23.2* 24.4* 24.4* 23.2* 24.3*  PLT 583* 644* 662* 620* 657*  MCV 86.6 87.1 87.1 85.6 86.8  MCH 26.5 26.4 26.8 26.9 26.8  MCHC 30.6 30.3 30.7 31.5 30.9  RDW 18.4* 18.5* 18.4* 18.9* 18.8*     Recent Labs  Lab 05/21/22 0551 05/22/22 0849 05/23/22 0405 05/24/22 0458 05/25/22 0310  NA 132* 132* 131* 132* 130*  K 3.9 4.2 4.1 4.2 4.6  CL 98 97* 96* 97* 94*  CO2 21* 24 23 23 22   GLUCOSE 157* 263* 198* 138* 241*  BUN 84* 86* 88* 90* 93*  CREATININE 3.12* 3.51* 3.22* 3.08* 3.36*  CALCIUM 8.9 9.1 9.3 9.2 9.3  AST  --   --  20  --   --   ALT  --   --  24  --   --   ALKPHOS  --   --  94  --   --   BILITOT  --   --  0.8  --   --   ALBUMIN  --   --  2.0*  --   --   MG  --   --  1.9  --  2.0     ------------------------------------------------------------------------------------------------------------------ No results for input(s): "CHOL", "HDL", "LDLCALC", "TRIG", "CHOLHDL", "LDLDIRECT" in the last 72 hours.  Lab Results  Component Value Date   HGBA1C 10.1 (H) 03/21/2022   ------------------------------------------------------------------------------------------------------------------ No results for input(s): "TSH", "T4TOTAL", "T3FREE", "THYROIDAB" in the last 72 hours.  Invalid input(s): "FREET3"  Cardiac Enzymes No results for input(s): "CKMB", "TROPONINI", "MYOGLOBIN" in the last 168 hours.  Invalid input(s):  "CK" ------------------------------------------------------------------------------------------------------------------    Component Value Date/Time   BNP 24.5 05/10/2022 2038    CBG: Recent Labs  Lab 05/24/22 1144 05/24/22 1550 05/24/22 2013 05/25/22 0631 05/25/22 0744  GLUCAP 187* 201* 174* 210* 207*     No results found for this or any previous visit (from the past  240 hour(s)).    Radiology Studies: No results found.   Marzetta Board, MD, PhD [facility]  Between 7 am - 7 pm I am available, please contact me via Amion (for emergencies) or Securechat (non urgent messages)  Between 7 pm - 7 am I am not available, please contact night coverage MD/APP via Amion

## 2022-05-26 DIAGNOSIS — R112 Nausea with vomiting, unspecified: Secondary | ICD-10-CM | POA: Diagnosis not present

## 2022-05-26 DIAGNOSIS — E1100 Type 2 diabetes mellitus with hyperosmolarity without nonketotic hyperglycemic-hyperosmolar coma (NKHHC): Secondary | ICD-10-CM | POA: Diagnosis not present

## 2022-05-26 DIAGNOSIS — N184 Chronic kidney disease, stage 4 (severe): Secondary | ICD-10-CM | POA: Diagnosis not present

## 2022-05-26 DIAGNOSIS — I10 Essential (primary) hypertension: Secondary | ICD-10-CM | POA: Diagnosis not present

## 2022-05-26 LAB — CBC
HCT: 25.2 % — ABNORMAL LOW (ref 36.0–46.0)
Hemoglobin: 7.6 g/dL — ABNORMAL LOW (ref 12.0–15.0)
MCH: 26.2 pg (ref 26.0–34.0)
MCHC: 30.2 g/dL (ref 30.0–36.0)
MCV: 86.9 fL (ref 80.0–100.0)
Platelets: 705 10*3/uL — ABNORMAL HIGH (ref 150–400)
RBC: 2.9 MIL/uL — ABNORMAL LOW (ref 3.87–5.11)
RDW: 19.1 % — ABNORMAL HIGH (ref 11.5–15.5)
WBC: 17.6 10*3/uL — ABNORMAL HIGH (ref 4.0–10.5)
nRBC: 0 % (ref 0.0–0.2)

## 2022-05-26 LAB — BASIC METABOLIC PANEL
Anion gap: 13 (ref 5–15)
BUN: 96 mg/dL — ABNORMAL HIGH (ref 6–20)
CO2: 23 mmol/L (ref 22–32)
Calcium: 9.4 mg/dL (ref 8.9–10.3)
Chloride: 94 mmol/L — ABNORMAL LOW (ref 98–111)
Creatinine, Ser: 3.87 mg/dL — ABNORMAL HIGH (ref 0.44–1.00)
GFR, Estimated: 13 mL/min — ABNORMAL LOW (ref >60)
Glucose, Bld: 228 mg/dL — ABNORMAL HIGH (ref 70–99)
Potassium: 4.2 mmol/L (ref 3.5–5.1)
Sodium: 130 mmol/L — ABNORMAL LOW (ref 135–145)

## 2022-05-26 LAB — GLUCOSE, CAPILLARY
Glucose-Capillary: 303 mg/dL — ABNORMAL HIGH (ref 70–99)
Glucose-Capillary: 143 mg/dL — ABNORMAL HIGH (ref 70–99)
Glucose-Capillary: 177 mg/dL — ABNORMAL HIGH (ref 70–99)
Glucose-Capillary: 187 mg/dL — ABNORMAL HIGH (ref 70–99)

## 2022-05-26 MED ORDER — PHENOL 1.4 % MT LIQD
1.0000 | OROMUCOSAL | Status: DC | PRN
  Administered 2022-05-26: 1 via OROMUCOSAL
  Filled 2022-05-26: qty 177

## 2022-05-26 MED ORDER — HYDROMORPHONE HCL 2 MG PO TABS
1.0000 mg | ORAL_TABLET | Freq: Four times a day (QID) | ORAL | Status: DC | PRN
  Administered 2022-05-26: 2 mg via ORAL
  Filled 2022-05-26: qty 1

## 2022-05-26 MED ORDER — MENTHOL 3 MG MT LOZG
1.0000 | LOZENGE | OROMUCOSAL | Status: DC | PRN
  Administered 2022-05-26: 3 mg via ORAL
  Filled 2022-05-26: qty 9

## 2022-05-26 NOTE — Progress Notes (Signed)
Nutrition Follow-up  DOCUMENTATION CODES:   Morbid obesity  INTERVENTION:  - Continue CHO modified diet.   - RD will attach diet education to discharge paperwork.   NUTRITION DIAGNOSIS:   Inadequate oral intake related to acute illness, decreased appetite as evidenced by per patient/family report. - Improving.  GOAL:   Patient will meet greater than or equal to 90% of their needs - Improving   MONITOR:   Supplement acceptance, Diet advancement, Labs, Weight trends  REASON FOR ASSESSMENT:   Consult Assessment of nutrition requirement/status  ASSESSMENT:   Pt admitted with n/v and abdominal pain with admission 1/10-1/15 with similar presentation. PMH significant for T2DM, gastroparesis, CKD IV, afib, gastric ulcer, carcinoid tumor of the duodenum, systolic CHF EF A999333, HTN.  Meds reviewed: lipitor, Vit B12, pepcid, ferrous sulfate, sliding scale insulin, lasix, semglee, MVI, sodium bicarbonate. Labs reviewed: Na low, BUN/creatinine elevated. FS Glucose: 96-303 mg/dL.   Pt ate 50-75% of her meals yesterday. Per record, pt has been eating mostly 50-100% of her meals since admission. RD will continue to monitor PO intakes.   Diet Order:   Diet Order             Diet Carb Modified Fluid consistency: Thin; Room service appropriate? Yes  Diet effective now                   EDUCATION NEEDS:   No education needs have been identified at this time  Skin:  Skin Assessment: Reviewed RN Assessment  Last BM:  3/18 - type 7  Height:   Ht Readings from Last 1 Encounters:  05/11/22 5' 5.98" (1.676 m)    Weight:   Wt Readings from Last 1 Encounters:  05/13/22 (!) 139.2 kg    Ideal Body Weight:  59.1 kg  BMI:  Body mass index is 49.56 kg/m.  Estimated Nutritional Needs:   Kcal:  1500-1700  Protein:  80-95g  Fluid:  >/=1.5L  Thalia Bloodgood, RD, LDN, CNSC.

## 2022-05-26 NOTE — Plan of Care (Signed)
0730: Pt resting in bed at beginning of shift, pt drowsy but able to wake up when spoken to. Pt alert and oriented x3, pt intermittently forgetful/confused.  Pt on room air. Bed alarm in place.   0900: Pt drowsy and falling asleep while having a conversation with RN, RN held morning dose of oral morphine, and made MD aware during shift.   1100: RN spoke with pt and tried to educate pt about trying to be more mobile if possible and discarding the purewick. RN educated pt that purewick was not working well for pt and constantly leaking, pt does not call out to RN for clean-up and pt sitting in urine. Pt room having a strong urine-order. RN asked pt if she is able to tell RN when she feels the urge to void and RN's and NA's can come to place pt on bed pan or assist pt to [facility] if pt able to stand. Pt stated to RN she is unable to stand and that the bed pan hurts, pt states those are not options. Rn spoke with pt about notifying RN when pt has wet the bed so pt can be cleaned and not sit in urine. Pt more awake throughout the day. Pt received Prn oral dilaudid for pain.  Pt seen by OT during shift, Purewick removed from pt due to not working properly.Pt cleaned up by OT.  MD ordered UA to reflex culture, RN notified MD of difficulties with being able to measure/catch urine, due to pt incontinence and not wanting to use bedpan or BSC, Md aware and RN will attempt to collect sample.   1530: RN went to speak with pt about collecting sample, pt states she does not need to void at this time and unable to try. RN educated pt to try to call RN if pt feels the urge to void.   1800: RN went to try to place pt on bedpan to collect sample, but pt had an incontinence episode and was being cleaned up by NA, RN unable to collect sample at this time. Pt stated ," I want to apologize to you and staff about my behavior, I want to try harder to help myself." Pt agreeable to call RN if she feels urge to void.

## 2022-05-26 NOTE — Progress Notes (Signed)
Deborah [name], is a 57 y.o. female, DOB - Jan 25, 1966, [id number] Admit date - 05/10/2022    Outpatient Primary MD for the patient is [name], [name], MD  LOS - 15  days    Brief summary   Patient is a 57 year old female with past medical history of morbid obesity, spinal stenosis uncontrolled diabetes mellitus type 2 with gastroparesis and stage IV chronic kidney disease, atrial fibrillation, hypertension and carcinoid tumor of the duodenum who presented to the emergency room on the evening of 3/4 with 1 week of abdominal pain, nausea and vomiting.  In the emergency room, patient found to have blood sugar of 540 and nonketotic hyperglycemia.  Patient started on IV fluids and insulin drip.  In addition, she reported to the admitting physician that she was having significant pain in her lower extremities which is now much worse even when she tried to pivot in and out of her wheelchair.  Patient was admitted to the hospitalist service.   Assessment & Plan    Assessment and Plan:   Anemia of chronic disease secondary to CKD,  Normocytic anemia:  Iron deficiency anemia S/p 2 units of prbc transfusion.  GI consulted and they signed off 3/16 Fecal occult blood is negative.  Hemoglobin stable around 7.5.   * Hyperosmolar non-ketotic state due to type 2 diabetes mellitus (HCC) CBG (last 3)  Recent Labs    05/26/22 0758 05/26/22 1202 05/26/22 1620  GLUCAP 303* 143* 177*   Suboptimally controlled, currently on 15 units of Semglee 15 units daily, SSI. And novolog 8 units TIDAC.   Spinal stenosis, lumbar region with neurogenic claudication she has been wheelchair-bound for several months, now has worsening pain in the lower extremities more so on the right.  Lumbar spine MRI was done on 3/6 which showed lower lumbar spondylosis worsening since 2019, worst at L4-5,  with facet arthropathy with joint effusion and periarticular edema and unchanged severe right neuroforaminal narrowing at L5-S1.  Neurosurgery evaluated patient and did not recommend surgical approach at this point but continued PT, outpatient follow-up -Pain is now controlled with oral agents, she is on a combination of MS Contin BID alternating with oral Dilaudid -Discussed with TOC, now that the hemoglobin is stable and pain is controlled with oral agents insurance authorization started. Plan for SNF placement in am.     Leukocytosis White blood cell count of 15.4 on admission.  Persistently elevated leukocytosis of unclear etiology. Recommend outpatient follow up with hematology on discharge for further workup.  monitor off antibiotics, she is afebrile. Procalcitonin was low at 0.12. UA negative, CT chest, abdomen, pelvis negative for apparent infections   PAF (paroxysmal atrial fibrillation) (HCC) Rate controlled with metoprolol and eliquis.   Chronic kidney disease (CKD), stage IV (severe) (HCC) Secondary to  diabetes mellitus, at baseline.   Malignant carcinoid tumor of duodenum [facility]) Outpatient with oncology  Hypomagnesemia and hypokalemia Replaced.   GERD (gastroesophageal reflux disease) Continue PPI  Morbid obesity (HCC) Meets criteria BMI greater than 40  Chronic diastolic heart failure:  On lasix 80 mg daily.    Hyperlipidemia:  Resume lipitor.   Malnutrition Type:  Nutrition Problem: Inadequate oral intake Etiology: acute illness, decreased appetite   Malnutrition Characteristics:  Signs/Symptoms: per patient/family report   Nutrition Interventions:  Interventions: Boost Breeze, MVI  Estimated body mass index is 49.56 kg/m as calculated from the following:   Height as of this encounter: 5' 5.98" (1.676 m).   Weight as of this encounter: 139.2 kg.  Code Status: full code.  DVT Prophylaxis:   apixaban (ELIQUIS) tablet 5 mg   Level of Care: Level  of care: Med-Surg Family Communication: none at bedside.   Disposition Plan:     Remains inpatient appropriate:  SNF in am.   Procedures:  None.   Consultants:   NS curbside.   Antimicrobials:   Anti-infectives (From admission, onward)    None        Medications  Scheduled Meds:  apixaban  5 mg Oral BID   atorvastatin  10 mg Oral Daily   diclofenac Sodium  4 g Topical QID   DULoxetine  40 mg Oral BID   famotidine  20 mg Oral Daily   ferrous sulfate  325 mg Oral BID WC   furosemide  80 mg Oral Daily   insulin aspart  0-20 Units Subcutaneous TID WC   insulin aspart  0-5 Units Subcutaneous QHS   insulin aspart  8 Units Subcutaneous TID WC   insulin glargine-yfgn  15 Units Subcutaneous QHS   loratadine  10 mg Oral Daily   metoprolol succinate  50 mg Oral Daily   morphine  15 mg Oral Q12H   multivitamin with minerals  1 tablet Oral Daily   pantoprazole  40 mg Oral BID   sodium bicarbonate  650 mg Oral BID   Continuous Infusions: PRN Meds:.albuterol, dextrose, HYDROmorphone, melatonin, menthol-cetylpyridinium, ondansetron (ZOFRAN) IV, phenol    Subjective:   Seymone Blew was seen and examined today. No new complaints. Recommended to get out of bed.   Objective:   Vitals:   05/25/22 1945 05/25/22 2120 05/26/22 0428 05/26/22 0730  BP:  126/82 (!) 112/51 116/60  Pulse: 80 85 (!) 103 99  Resp: 16 18 16 15   Temp: 98.5 F (36.9 C) 98.9 F (37.2 C) 98 F (36.7 C) 98.5 F (36.9 C)  TempSrc: Oral  Oral Oral  SpO2:  100% 97% 98%  Weight:      Height:        Intake/Output Summary (Last 24 hours) at 05/26/2022 1605 Last data filed at 05/26/2022 0718 Gross per 24 hour  Intake 120 ml  Output 1300 ml  Net -1180 ml   Filed Weights   05/11/22 0501 05/12/22 0033 05/13/22 0335  Weight: 133.8 kg (!) 138.9 kg (!) 139.2 kg     Exam General exam: Appears calm and comfortable  Respiratory system: Clear to auscultation. Respiratory effort  normal. Cardiovascular system: S1 & S2 heard, RRR. No JVD,  Gastrointestinal system: Abdomen is nondistended, soft and nontender. Central nervous system: Alert and oriented. No focal neurological deficits. Extremities: Symmetric 5 x 5 power. Skin: No rashes,  Psychiatry:  Mood & affect appropriate.     Data Reviewed:  I have personally reviewed following labs and imaging studies   CBC Lab Results  Component Value Date   WBC 17.6 (H) 05/26/2022   RBC 2.90 (L) 05/26/2022   HGB 7.6 (L) 05/26/2022   HCT 25.2 (L) 05/26/2022   MCV 86.9 05/26/2022  MCH 26.2 05/26/2022   PLT 705 (H) 05/26/2022   MCHC 30.2 05/26/2022   RDW 19.1 (H) 05/26/2022   LYMPHSABS 1.2 05/18/2022   MONOABS 0.6 05/18/2022   EOSABS 0.3 05/18/2022   BASOSABS 0.1 [id number]     Last metabolic panel Lab Results  Component Value Date   NA 130 (L) 05/26/2022   K 4.2 05/26/2022   CL 94 (L) 05/26/2022   CO2 23 05/26/2022   BUN 96 (H) 05/26/2022   CREATININE 3.87 (H) 05/26/2022   GLUCOSE 228 (H) 05/26/2022   GFRNONAA 13 (L) 05/26/2022   GFRAA 49 (L) 11/30/2019   CALCIUM 9.4 05/26/2022   PHOS 2.7 05/11/2022   PROT 7.7 05/23/2022   ALBUMIN 2.0 (L) 05/23/2022   BILITOT 0.8 05/23/2022   ALKPHOS 94 05/23/2022   AST 20 05/23/2022   ALT 24 05/23/2022   ANIONGAP 13 05/26/2022    CBG (last 3)  Recent Labs    05/25/22 2123 05/26/22 0758 05/26/22 1202  GLUCAP 96 303* 143*      Coagulation Profile: No results for input(s): "INR", "PROTIME" in the last 168 hours.   Radiology Studies: No results found.     Hosie Poisson M.D. [facility] 05/26/2022, 4:05 PM  Available via Epic secure chat 7am-7pm After 7 pm, please refer to night coverage provider listed on amion.

## 2022-05-26 NOTE — Discharge Instructions (Signed)
Carbohydrate Counting For People With Diabetes  Foods with carbohydrates make your blood glucose level go up. Learning how to count carbohydrates can help you control your blood glucose levels. First, identify the foods you eat that contain carbohydrates. Then, using the Foods with Carbohydrates chart, determine about how much carbohydrates are in your meals and snacks. Make sure you are eating foods with fiber, protein, and healthy fat along with your carbohydrate foods. Foods with Carbohydrates The following table shows carbohydrate foods that have about 15 grams of carbohydrate each. Using measuring cups, spoons, or a food scale when you first begin learning about carbohydrate counting can help you learn about the portion sizes you typically eat. The following foods have 15 grams carbohydrate each:  Grains 1 slice bread (1 ounce)  1 small tortilla (6-inch size)   large bagel (1 ounce)  1/3 cup pasta or rice (cooked)   hamburger or hot dog bun ( ounce)   cup cooked cereal   to  cup ready-to-eat cereal  2 taco shells (5-inch size) Fruit 1 small fresh fruit ( to 1 cup)   medium banana  17 small grapes (3 ounces)  1 cup melon or berries   cup canned or frozen fruit  2 tablespoons dried fruit (blueberries, cherries, cranberries, raisins)   cup unsweetened fruit juice  Starchy Vegetables  cup cooked beans, peas, corn, potatoes/sweet potatoes   large baked potato (3 ounces)  1 cup acorn or butternut squash  Snack Foods 3 to 6 crackers  8 potato chips or 13 tortilla chips ( ounce to 1 ounce)  3 cups popped popcorn  Dairy 3/4 cup (6 ounces) nonfat plain yogurt, or yogurt with sugar-free sweetener  1 cup milk  1 cup plain rice, soy, coconut or flavored almond milk Sweets and Desserts  cup ice cream or frozen yogurt  1 tablespoon jam, jelly, pancake syrup, table sugar, or honey  2 tablespoons light pancake syrup  1 inch square of frosted cake or 2 inch square of unfrosted  cake  2 small cookies (2/3 ounce each) or  large cookie  Sometimes you'll have to estimate carbohydrate amounts if you don't know the exact recipe. One cup of mixed foods like soups can have 1 to 2 carbohydrate servings, while some casseroles might have 2 or more servings of carbohydrate. Foods that have less than 20 calories in each serving can be counted as "free" foods. Count 1 cup raw vegetables, or  cup cooked non-starchy vegetables as "free" foods. If you eat 3 or more servings at one meal, then count them as 1 carbohydrate serving.  Foods without Carbohydrates  Not all foods contain carbohydrates. Meat, some dairy, fats, non-starchy vegetables, and many beverages don't contain carbohydrate. So when you count carbohydrates, you can generally exclude chicken, pork, beef, fish, seafood, eggs, tofu, cheese, butter, sour cream, avocado, nuts, seeds, olives, mayonnaise, water, black coffee, unsweetened tea, and zero-calorie drinks. Vegetables with no or low carbohydrate include green beans, cauliflower, tomatoes, and onions. How much carbohydrate should I eat at each meal?  Carbohydrate counting can help you plan your meals and manage your weight. Following are some starting points for carbohydrate intake at each meal. Work with your registered dietitian nutritionist to find the best range that works for your blood glucose and weight.   To Lose Weight To Maintain Weight  Women 2 - 3 carb servings 3 - 4 carb servings  Men 3 - 4 carb servings 4 - 5 carb servings  Checking your   blood glucose after meals will help you know if you need to adjust the timing, type, or number of carbohydrate servings in your meal plan. Achieve and keep a healthy body weight by balancing your food intake and physical activity.  Tips How should I plan my meals?  Plan for half the food on your plate to include non-starchy vegetables, like salad greens, broccoli, or carrots. Try to eat 3 to 5 servings of non-starchy vegetables  every day. Have a protein food at each meal. Protein foods include chicken, fish, meat, eggs, or beans (note that beans contain carbohydrate). These two food groups (non-starchy vegetables and proteins) are low in carbohydrate. If you fill up your plate with these foods, you will eat less carbohydrate but still fill up your stomach. Try to limit your carbohydrate portion to  of the plate.  What fats are healthiest to eat?  Diabetes increases risk for heart disease. To help protect your heart, eat more healthy fats, such as olive oil, nuts, and avocado. Eat less saturated fats like butter, cream, and high-fat meats, like bacon and sausage. Avoid trans fats, which are in all foods that list "partially hydrogenated oil" as an ingredient. What should I drink?  Choose drinks that are not sweetened with sugar. The healthiest choices are water, carbonated or seltzer waters, and tea and coffee without added sugars.  Sweet drinks will make your blood glucose go up very quickly. One serving of soda or energy drink is  cup. It is best to drink these beverages only if your blood glucose is low.  Artificially sweetened, or diet drinks, typically do not increase your blood glucose if they have zero calories in them. Read labels of beverages, as some diet drinks do have carbohydrate and will raise your blood glucose. Label Reading Tips Read Nutrition Facts labels to find out how many grams of carbohydrate are in a food you want to eat. Don't forget: sometimes serving sizes on the label aren't the same as how much food you are going to eat, so you may need to calculate how much carbohydrate is in the food you are serving yourself.   Carbohydrate Counting for People with Diabetes Sample 1-Day Menu  Breakfast  cup yogurt, low fat, low sugar (1 carbohydrate serving)   cup cereal, ready-to-eat, unsweetened (1 carbohydrate serving)  1 cup strawberries (1 carbohydrate serving)   cup almonds ( carbohydrate serving)   Lunch 1, 5 ounce can chunk light tuna  2 ounces cheese, low fat cheddar  6 whole wheat crackers (1 carbohydrate serving)  1 small apple (1 carbohydrate servings)   cup carrots ( carbohydrate serving)   cup snap peas  1 cup 1% milk (1 carbohydrate serving)   Evening Meal Stir fry made with: 3 ounces chicken  1 cup brown rice (3 carbohydrate servings)   cup broccoli ( carbohydrate serving)   cup green beans   cup onions  1 tablespoon olive oil  2 tablespoons teriyaki sauce ( carbohydrate serving)  Evening Snack 1 extra small banana (1 carbohydrate serving)  1 tablespoon peanut butter   Carbohydrate Counting for People with Diabetes Vegan Sample 1-Day Menu  Breakfast 1 cup cooked oatmeal (2 carbohydrate servings)   cup blueberries (1 carbohydrate serving)  2 tablespoons flaxseeds  1 cup soymilk fortified with calcium and vitamin D  1 cup coffee  Lunch 2 slices whole wheat bread (2 carbohydrate servings)   cup baked tofu   cup lettuce  2 slices tomato  2 slices avocado     cup baby carrots ( carbohydrate serving)  1 orange (1 carbohydrate serving)  1 cup soymilk fortified with calcium and vitamin D   Evening Meal Burrito made with: 1 6-inch corn tortilla (1 carbohydrate serving)  1 cup refried vegetarian beans (2 carbohydrate servings)   cup chopped tomatoes   cup lettuce   cup salsa  1/3 cup brown rice (1 carbohydrate serving)  1 tablespoon olive oil for rice   cup zucchini   Evening Snack 6 small whole grain crackers (1 carbohydrate serving)  2 apricots ( carbohydrate serving)   cup unsalted peanuts ( carbohydrate serving)    Carbohydrate Counting for People with Diabetes Vegetarian (Lacto-Ovo) Sample 1-Day Menu  Breakfast 1 cup cooked oatmeal (2 carbohydrate servings)   cup blueberries (1 carbohydrate serving)  2 tablespoons flaxseeds  1 egg  1 cup 1% milk (1 carbohydrate serving)  1 cup coffee  Lunch 2 slices whole wheat bread (2 carbohydrate  servings)  2 ounces low-fat cheese   cup lettuce  2 slices tomato  2 slices avocado   cup baby carrots ( carbohydrate serving)  1 orange (1 carbohydrate serving)  1 cup unsweetened tea  Evening Meal Burrito made with: 1 6-inch corn tortilla (1 carbohydrate serving)   cup refried vegetarian beans (1 carbohydrate serving)   cup tomatoes   cup lettuce   cup salsa  1/3 cup brown rice (1 carbohydrate serving)  1 tablespoon olive oil for rice   cup zucchini  1 cup 1% milk (1 carbohydrate serving)  Evening Snack 6 small whole grain crackers (1 carbohydrate serving)  2 apricots ( carbohydrate serving)   cup unsalted peanuts ( carbohydrate serving)    Copyright 2020  [employer]. All rights reserved.  Using Nutrition Labels: Carbohydrate  Serving Size  Look at the serving size. All the information on the label is based on this portion. Servings Per Container  The number of servings contained in the package. Guidelines for Carbohydrate  Look at the total grams of carbohydrate in the serving size.  1 carbohydrate choice = 15 grams of carbohydrate. Range of Carbohydrate Grams Per Choice  Carbohydrate Grams/Choice Carbohydrate Choices  6-10   11-20 1  21-25 1  26-35 2  36-40 2  41-50 3  51-55 3  56-65 4  66-70 4  71-80 5    Copyright 2020  [employer]. All rights reserved.  

## 2022-05-26 NOTE — Progress Notes (Signed)
Physical Therapy Treatment Patient Details Name: Deborah [name] MRN: [id number] DOB: [birth year] Today's Date: 05/26/2022   History of Present Illness 57 y.o. female with medical history significant of DM2, CKD IV, A.fib    Gastric ulcer, carcinoid tumor of the duodenum, systolic CHF EF [id number], HTN    Admitted for   HHS, leg pain possible sciatica    PT Comments    Pt continues to be self limiting and limited by pain. Pt appears to be confused, insisting on taking her "blanket" off, even though it was her gown, and then a moment later putting in back on. Pt also not recalling having taken her pain meds prior to the session. Pt needing extensive encouragement and education to participate, performing a few seated therapeutic exercises to improve her lower extremity ROM and strength. Pt also only gave a brief poor attempt to stand, not even clearing her buttocks from the bed surface. When cued to scoot towards [place] prior to lying down, she would transition to supine without scooting, then she would return herself to sitting EOB again. Pt with poor understanding that she needs to start with attempting various exercises and functional mobility if she wants to make progress. Pt easily agitated as well. Will continue to follow acutely. Current recommendations remain appropriate, but pt may need to pursue long-term care.      Recommendations for follow up therapy are one component of a multi-disciplinary discharge planning process, led by the attending physician.  Recommendations may be updated based on patient status, additional functional criteria and insurance authorization.  Follow Up Recommendations  Skilled nursing-short term rehab (<3 hours/day) (potentially long-term care?) Can patient physically be transported by private vehicle: No   Assistance Recommended at Discharge Frequent or constant Supervision/Assistance  Patient can return home with the following Assistance with  cooking/housework;Assist for transportation;Help with stairs or ramp for entrance;Two people to help with walking and/or transfers;A lot of help with bathing/dressing/bathroom;Direct supervision/assist for medications management;Direct supervision/assist for financial management   Equipment Recommendations  BSC/3in1;Hospital bed;Rolling walker (2 wheels);Other (comment) (bari drop arm; bari RW)    Recommendations for Other Services       Precautions / Restrictions Precautions Precautions: Fall Restrictions Weight Bearing Restrictions: No     Mobility  Bed Mobility Overal bed mobility: Needs Assistance Bed Mobility: Supine to Sit, Sit to Supine, Rolling Rolling: Modified independent (Device/Increase time)   Supine to sit: Supervision Sit to supine: Supervision   General bed mobility comments: Pt using bed rails to roll, needing repeated cues and multiple attempts to get pt to concentrate to roll to change bed pad due to the current one being saturated in urine. Pt transitioning herself supine <> sit 2x during session even though cued to try to stand or scoot up to [facility] prior to transitioning to supine, supervision for safety    Transfers Overall transfer level: Needs assistance Equipment used: 1 person hand held assist Transfers: Sit to/from Stand             General transfer comment: Pt frustratingly said "ok let's try" when encouraged to stand to step towards HOB to lay down. But pt only minimally activating legs and arms with weak pull on therapist for <1 second attempt to stand. Did not even clear her buttocks    Ambulation/Gait               General Gait Details: deferred   Stairs             Wheelchair  Mobility    Modified Rankin (Stroke Patients Only)       Balance Overall balance assessment: Needs assistance Sitting-balance support: No upper extremity supported, Feet supported Sitting balance-Leahy Scale: Good Sitting balance - Comments:  Able to sit EOB and reach off BOS without LOB, x2 bouts of >10 min each bout       Standing balance comment: pt giving poor attempt to stand, unsuccessful                            Cognition Arousal/Alertness: Awake/alert Behavior During Therapy: Restless, Flat affect, Agitated Overall Cognitive Status: No family/caregiver present to determine baseline cognitive functioning                                 General Comments: Easily agitated, stating "You think I am lying" even though repeatedly notified pt that this therapist never said that and does not think that, encouraged her that I am sure her pain is real but she needs to try to push through it and participate to improve, especially while pain meds are in her system and thus reducing her pain. Circular conversation. Self limiting, often stating "I can't". When educated she needs to at least try she states "I am" yet she would give little to no effort. Pt often believing she was going to have an asthma attack, refusing to place the bed on a flat level even though she was supine and flat for several minutes earlier. Pt taking her gown off, insisting therapist to "take this blanket off". When educated it was her gown and she would be naked she insisted she knew that and wanted it off, but then a minute after having it off she put it back on.        Exercises General Exercises - Lower Extremity Ankle Circles/Pumps: AROM, Both, Other reps (comment), Seated (> x7 reps) Long Arc Quad: AROM, Strengthening, Both, 10 reps, Seated Hip Flexion/Marching: AROM, Strengthening, Both, 5 reps, Seated    General Comments General comments (skin integrity, edema, etc.): provided extensive education and encouragement for pt to try to participate with therapy to try to assist her in becoming more independent and help her get admitted to a rehab program      Pertinent Vitals/Pain Pain Assessment Pain Assessment: Faces Faces Pain  Scale: Hurts even more Pain Location: bil legs, back, abdomen Pain Descriptors / Indicators: Discomfort, Grimacing, Guarding Pain Intervention(s): Limited activity within patient's tolerance, Monitored during session, Premedicated before session, Repositioned    Home Living                          Prior Function            PT Goals (current goals can now be found in the care plan section) Acute Rehab PT Goals Patient Stated Goal: decrease pain PT Goal Formulation: With patient Time For Goal Achievement: 06/09/22 Potential to Achieve Goals: Fair Progress towards PT goals: Not progressing toward goals - comment (pain, self limiting)    Frequency    Min 2X/week      PT Plan Equipment recommendations need to be updated    Co-evaluation              AM-PAC PT "6 Clicks" Mobility   Outcome Measure  Help needed turning from your back to your side while in a  flat bed without using bedrails?: A Little Help needed moving from lying on your back to sitting on the side of a flat bed without using bedrails?: A Little Help needed moving to and from a bed to a chair (including a wheelchair)?: Total Help needed standing up from a chair using your arms (e.g., wheelchair or bedside chair)?: Total Help needed to walk in hospital room?: Total Help needed climbing 3-5 steps with a railing? : Total 6 Click Score: 10    End of Session   Activity Tolerance: Patient limited by pain;Other (comment) (self limiting) Patient left: in bed;with call bell/phone within reach;with bed alarm set;with nursing/sitter in room Nurse Communication: Mobility status;Other (comment) (pt confusion, pt asking about her pain meds) PT Visit Diagnosis: Muscle weakness (generalized) (M62.81);Pain Pain - Right/Left:  (bil) Pain - part of body: Leg (abdomen, back)     Time: [id number] PT Time Calculation (min) (ACUTE ONLY): 36 min  Charges:  $Gait Training: 8-22 mins $Therapeutic Activity:  8-22 mins                     Moishe Spice, PT, DPT Acute Rehabilitation Services  Office: [place] 05/26/2022, 12:55 PM

## 2022-05-26 NOTE — TOC Progression Note (Signed)
Transition of Care [facility]) - Progression Note    Patient Details  Name: Deborah [name] MRN: [id number] Date of Birth: 20-Sep-1965  Transition of Care [facility]) CM/SW [place], [place] Phone Number: 05/26/2022, 4:27 PM  Clinical Narrative:     New PT note today was submitted for pt's pending SNF auth. Auth approved 3/19 - 3/21 Plan Auth ID [id number] [employer] Auth ID [id number].  Will plan to DC to [facility]. SNF has been updated.   Expected Discharge Plan: Skilled Nursing Facility Barriers to Discharge: Insurance Authorization  Expected Discharge Plan and Services In-house Referral: NA Discharge Planning Services: CM Consult Post Acute Care Choice: [place] Living arrangements for the past 2 months: Single Family Home                 DME Arranged: Bedside commode DME Agency: [employer] Date DME Agency Contacted: 05/12/22 Time DME Agency Contacted: [phone] Representative spoke with at DME Agency: Erasmo Downer HH Arranged: RN, Disease Management, PT, Social Work [employer] Agency: [place] Date [place]: 05/12/22 Time [place]: [place] Representative spoke with at [place]: [place] Determinants of Health ([place]) Interventions [place]: No Food Insecurity (05/11/2022)  Housing: Low Risk  (05/11/2022)  Transportation Needs: No Transportation Needs (05/11/2022)  Utilities: Not At Risk (05/11/2022)  Depression (PHQ2-9): Medium Risk (09/16/2021)  Tobacco Use: Low Risk  (05/10/2022)    Readmission Risk Interventions     No data to display

## 2022-05-27 DIAGNOSIS — I10 Essential (primary) hypertension: Secondary | ICD-10-CM | POA: Diagnosis not present

## 2022-05-27 DIAGNOSIS — K219 Gastro-esophageal reflux disease without esophagitis: Secondary | ICD-10-CM | POA: Diagnosis not present

## 2022-05-27 DIAGNOSIS — E1100 Type 2 diabetes mellitus with hyperosmolarity without nonketotic hyperglycemic-hyperosmolar coma (NKHHC): Secondary | ICD-10-CM | POA: Diagnosis not present

## 2022-05-27 LAB — GLUCOSE, CAPILLARY
Glucose-Capillary: 205 mg/dL — ABNORMAL HIGH (ref 70–99)
Glucose-Capillary: 172 mg/dL — ABNORMAL HIGH (ref 70–99)

## 2022-05-27 MED ORDER — DICLOFENAC SODIUM 1 % EX GEL: 4.0000 g | Freq: Four times a day (QID) | CUTANEOUS | Status: DC

## 2022-05-27 MED ORDER — HYDROMORPHONE HCL 2 MG PO TABS: 1.0000 mg | ORAL_TABLET | Freq: Three times a day (TID) | ORAL | 0 refills | Status: DC | PRN

## 2022-05-27 MED ORDER — ADULT MULTIVITAMIN W/MINERALS CH: 1.0000 | ORAL_TABLET | Freq: Every day | ORAL | Status: DC

## 2022-05-27 MED ORDER — INSULIN ASPART 100 UNIT/ML IJ SOLN: INTRAMUSCULAR | 11 refills | Status: DC

## 2022-05-27 MED ORDER — INSULIN GLARGINE-YFGN 100 UNIT/ML ~~LOC~~ SOLN: 15.0000 [IU] | Freq: Every day | SUBCUTANEOUS | 11 refills | Status: DC

## 2022-05-27 MED ORDER — FERROUS SULFATE 325 (65 FE) MG PO TABS: 325.0000 mg | ORAL_TABLET | Freq: Two times a day (BID) | ORAL | 3 refills | Status: DC

## 2022-05-27 MED ORDER — INSULIN ASPART 100 UNIT/ML IJ SOLN: 8.0000 [IU] | Freq: Three times a day (TID) | INTRAMUSCULAR | 11 refills | Status: DC

## 2022-05-27 NOTE — TOC Transition Note (Signed)
Transition of Care [facility]) - CM/SW Discharge Note   Patient Details  Name: Deborah [name] MRN: [id number] Date of Birth: 10/05/1965  Transition of Care [facility]) CM/SW Contact:  Amador Cunas, [place] Phone Number: 05/27/2022, 1:07 PM   Clinical Narrative: Pt for dc to [facility] today. Spoke to [place] in admissions who confirmed they are prepared to admit pt to room 108. Pt aware of dc and reports agreeable. RN provided with number for report and PTAR arranged for transport. SW signing off at dc.  Wandra Feinstein, MSW, LCSW [phone] (coverage)        Final next level of care: Skilled Nursing Facility Barriers to Discharge: No Barriers Identified   Patient Goals and CMS Choice CMS Medicare.gov Compare Post Acute Care list provided to:: Patient Choice offered to / list presented to : Patient  Discharge Placement                Patient chooses bed at:  [facility]) Patient to be transferred to facility by: PTAR   Patient and family notified of of transfer: 05/27/22  Discharge Plan and Services Additional resources added to the After Visit Summary for   In-house Referral: NA Discharge Planning Services: CM Consult Post Acute Care Choice: [place]          DME Arranged: Bedside commode DME Agency: [employer] Date DME Agency Contacted: 05/12/22 Time DME Agency Contacted: [phone] Representative spoke with at DME Agency: Erasmo Downer HH Arranged: RN, Disease Management, PT, Social Work [facility] Agency: [place] Date [place]: 05/12/22 Time [place]: [place] Representative spoke with at [place]: [place] Determinants of Health ([place]) Interventions SDOH Screenings   Food Insecurity: No Food Insecurity (05/11/2022)  Housing: Low Risk  (05/11/2022)  Transportation Needs: No Transportation Needs (05/11/2022)  Utilities: Not At Risk (05/11/2022)  Depression (PHQ2-9): Medium Risk (09/16/2021)  Tobacco Use: Low Risk   (05/10/2022)     Readmission Risk Interventions     No data to display

## 2022-05-27 NOTE — Plan of Care (Signed)
  Problem: Education: Goal: Knowledge of General Education information will improve Description: Including pain rating scale, medication(s)/side effects and non-pharmacologic comfort measures Outcome: Progressing   Problem: Health Behavior/Discharge Planning: Goal: Ability to manage health-related needs will improve Outcome: Progressing   Problem: Clinical Measurements: Goal: Will remain free from infection Outcome: Progressing Goal: Diagnostic test results will improve Outcome: Progressing Goal: Respiratory complications will improve Outcome: Progressing Goal: Cardiovascular complication will be avoided Outcome: Progressing   Problem: Activity: Goal: Risk for activity intolerance will decrease Outcome: Progressing   Problem: Nutrition: Goal: Adequate nutrition will be maintained Outcome: Progressing   Problem: Coping: Goal: Level of anxiety will decrease Outcome: Progressing   Problem: Elimination: Goal: Will not experience complications related to bowel motility Outcome: Progressing Goal: Will not experience complications related to urinary retention Outcome: Progressing   Problem: Pain Managment: Goal: General experience of comfort will improve Outcome: Progressing   Problem: Safety: Goal: Ability to remain free from injury will improve Outcome: Progressing   Problem: Skin Integrity: Goal: Risk for impaired skin integrity will decrease Outcome: Progressing   Problem: Education: Goal: Ability to describe self-care measures that may prevent or decrease complications (Diabetes Survival Skills Education) will improve Outcome: Progressing Goal: Individualized Educational Video(s) Outcome: Progressing   Problem: Coping: Goal: Ability to adjust to condition or change in health will improve Outcome: Progressing   Problem: Fluid Volume: Goal: Ability to maintain a balanced intake and output will improve Outcome: Progressing   Problem: Health Behavior/Discharge  Planning: Goal: Ability to identify and utilize available resources and services will improve Outcome: Progressing Goal: Ability to manage health-related needs will improve Outcome: Progressing   Problem: Metabolic: Goal: Ability to maintain appropriate glucose levels will improve Outcome: Progressing   Problem: Nutritional: Goal: Maintenance of adequate nutrition will improve Outcome: Progressing Goal: Progress toward achieving an optimal weight will improve Outcome: Progressing   Problem: Skin Integrity: Goal: Risk for impaired skin integrity will decrease Outcome: Progressing   Problem: Tissue Perfusion: Goal: Adequacy of tissue perfusion will improve Outcome: Progressing

## 2022-05-27 NOTE — Progress Notes (Addendum)
Patient has been discharged to SNF via Ambulance. PIV discounted, site looks intact and dry. All her belongings and AVS paper has been handed to patient.

## 2022-05-27 NOTE — Discharge Summary (Addendum)
Physician Discharge Summary   Patient: Deborah [name] MRN: [id number] DOB: 07-12-1965  Admit date:     05/10/2022  Discharge date: 05/27/22  Discharge Physician: Hosie Poisson   PCP: Cena Benton, MD   Recommendations at discharge:  [place].  PLEASE FOLLOW UP WITH GI for evaluation of the anemia at [facility] in the outpatient setting.  Please follow up with Nephrology for stage 4 CKD.  Please follow up with pain clinic as scheduled.  Please follow up with neuro surgery/ [facility] [place] [facility] for the back pain.    Discharge Diagnoses: Principal Problem:   Hyperosmolar non-ketotic state due to type 2 diabetes mellitus ([place]) Active Problems:   Spinal stenosis, lumbar region with neurogenic claudication   Leukocytosis   PAF (paroxysmal atrial fibrillation) (HCC)   Chronic kidney disease (CKD), stage IV (severe) (HCC)   Essential hypertension, benign   Malignant carcinoid tumor of duodenum (HCC)   Normocytic anemia   Hypomagnesemia and hypokalemia   GERD (gastroesophageal reflux disease)   Morbid obesity (HCC)   Anemia  Resolved Problems:   Intractable nausea and vomiting  Hospital Course: Patient is a 57 year old female with past medical history of morbid obesity, spinal stenosis uncontrolled diabetes mellitus type 2 with gastroparesis and stage IV chronic kidney disease, atrial fibrillation, hypertension and carcinoid tumor of the duodenum who presented to the emergency room on the evening of 3/4 with 1 week of abdominal pain, nausea and vomiting.  In the emergency room, patient found to have blood sugar of 540 and nonketotic hyperglycemia.  Patient started on IV fluids and insulin drip.  In addition, she reported to the admitting physician that she was having significant pain in her lower extremities which is now much worse even when she tried to pivot in and out of her wheelchair.  Patient was  admitted to the hospitalist service.    Assessment and Plan:    Anemia of chronic disease secondary to CKD,  Normocytic anemia:  Iron deficiency anemia S/p 2 units of prbc transfusion.  GI consulted and they signed off 3/16 Fecal occult blood is negative.  Hemoglobin stable around 7.5.     * Hyperosmolar non-ketotic state due to type 2 diabetes mellitus ([place]) Better controlled.  CBG (last 3)  Recent Labs    05/26/22 2156 05/27/22 0742 05/27/22 1205  GLUCAP 187* 205* 172*    currently on 15 units of Semglee 15 units daily, SSI. And novolog 8 units TIDAC.    Spinal stenosis, lumbar region with neurogenic claudication she has been wheelchair-bound for several months, now has worsening pain in the lower extremities more so on the right.  Lumbar spine MRI was done on 3/6 which showed lower lumbar spondylosis worsening since 2019, worst at L4-5, with facet arthropathy with joint effusion and periarticular edema and unchanged severe right neuroforaminal narrowing at L5-S1.  Neurosurgery evaluated patient and did not recommend surgical approach at this point but continued PT, outpatient follow-up -Pain is now controlled with oral agents, discharged her on oral dilaudid.  -Discussed with TOC, now that the hemoglobin is stable and pain is controlled with oral agents insurance authorization started. Plan for SNF placement in am.        Leukocytosis White blood cell count of 15.4 on admission.  Persistently elevated leukocytosis of unclear etiology. Recommend outpatient follow up with hematology on discharge for further workup.  monitor off antibiotics, she is afebrile. Procalcitonin was low at 0.12. UA negative,  CT chest, abdomen, pelvis negative for apparent infections    PAF (paroxysmal atrial fibrillation) (HCC) Rate controlled with metoprolol and eliquis.    Chronic kidney disease (CKD), stage IV (severe) (HCC) Secondary to  diabetes mellitus, at baseline.     Malignant  carcinoid tumor of duodenum [facility]) Outpatient with oncology   Hypomagnesemia and hypokalemia Replaced.    GERD (gastroesophageal reflux disease) Continue PPI   Morbid obesity (HCC) Meets criteria BMI greater than 40   Chronic diastolic heart failure:  Resume home dose of torsemide.      Hyperlipidemia:  Resume lipitor.    Malnutrition Type:   Nutrition Problem: Inadequate oral intake Etiology: acute illness, decreased appetite     Malnutrition Characteristics:   Signs/Symptoms: per patient/family report     Nutrition Interventions:   Interventions: Boost Breeze, MVI   Estimated body mass index is 49.56 kg/m as calculated from the following:   Height as of this encounter: 5' 5.98" (1.676 m).   Weight as of this encounter: 139.2 kg.        Consultants: GI  Procedures performed: NONE.   Disposition: Skilled nursing facility Diet recommendation:  Discharge Diet Orders (From admission, onward)     Start     Ordered   05/27/22 0000  Diet - low sodium heart healthy        05/27/22 1146           Carb modified diet DISCHARGE MEDICATION: Allergies as of 05/27/2022       Reactions   Diazepam [name]   Gabapentin [name], Swelling   Other reaction(s): Unknown   Iodinated Contrast Media Anaphylaxis   11/29/17 Cardiac arrest 1 min after IV contrast, possible allergy vs vasovagal episode Iopamidol  Anaphylaxis  High 11/28/2017  Patient had seizure like activity and then code post 100 cc of isovue 300     Isovue [iopamidol] Anaphylaxis   11/28/17 Patient had seizure like activity and then 1 min code after 100 cc of isovue 300. Possible contrast allergy vs vasovagal episode   Lisinopril Anaphylaxis   Tongue and mouth swelling   Metoclopramide Other (See Comments)   Tardive dyskinesia Also known as Reglan    Nsaids Anaphylaxis, Other (See Comments)   ULCER   Penicillins Palpitations   Has patient had a PCN reaction causing immediate  rash, facial/tongue/throat swelling, SOB or lightheadedness with hypotension: Yes, heart races Has patient had a PCN reaction causing severe rash involving mucus membranes or skin necrosis: No Has patient had a PCN reaction that required hospitalization: Yes  Has patient had a PCN reaction occurring within the last 10 years: No   Tolmetin Nausea Only, Other (See Comments)   ULCER   Dicyclomine Other (See Comments)   Chest pain   Acetaminophen Nausea Only, Other (See Comments)   Irritates stomach ulcer; Abdominal pain   Cyclobenzaprine Palpitations   Oxycodone Palpitations   Rifamycins Palpitations   Tramadol Nausea And Vomiting        Medication List     STOP taking these medications    HumaLOG KwikPen 100 UNIT/ML KwikPen Generic drug: insulin lispro   hydrALAZINE 25 MG tablet Commonly known as: APRESOLINE   isosorbide mononitrate 30 MG 24 hr tablet Commonly known as: IMDUR   Lantus SoloStar 100 UNIT/ML Solostar Pen Generic drug: insulin glargine       TAKE these medications    albuterol (2.5 MG/3ML) 0.083% nebulizer solution Commonly known as: PROVENTIL Take 3 mLs (2.5 mg total) by  nebulization every 6 (six) hours as needed for wheezing or shortness of breath.   apixaban 5 MG Tabs tablet Commonly known as: ELIQUIS Take 5 mg by mouth 2 (two) times daily.   atorvastatin 10 MG tablet Commonly known as: LIPITOR Take 10 mg by mouth daily.   cetirizine 10 MG tablet Commonly known as: ZYRTEC TAKE 1 TABLET (10 MG TOTAL) BY MOUTH DAILY (AM) What changed: See the new instructions.   diclofenac Sodium 1 % Gel Commonly known as: VOLTAREN Apply 4 g topically 4 (four) times daily.   DULoxetine HCl 40 MG Cpep Take 40 mg by mouth in the morning and at bedtime.   Easy Comfort Pen Needles 31G X 5 MM Misc Generic drug: Insulin Pen Needle USE 3 TIMES A DAY FOR INSULIN ADMINISTRATION   famotidine 20 MG tablet Commonly known as: PEPCID Take 1 tablet (20 mg total) by  mouth daily.   ferrous sulfate 325 (65 FE) MG tablet Take 1 tablet (325 mg total) by mouth 2 (two) times daily with a meal.   fluticasone 50 MCG/ACT nasal spray Commonly known as: FLONASE Place 2 sprays into both nostrils daily as needed for allergies or rhinitis. What changed:  how much to take reasons to take this   folic acid 1 MG tablet Commonly known as: FOLVITE Take 1 mg by mouth daily.   HYDROmorphone 2 MG tablet Commonly known as: DILAUDID Take 0.5-1 tablets (1-2 mg total) by mouth every 8 (eight) hours as needed for up to 3 days for severe pain. What changed:  medication strength See the new instructions.   insulin aspart 100 UNIT/ML injection Commonly known as: novoLOG CBG 70 - 120: 0 units  CBG 121 - 150: 3 units  CBG 151 - 200: 4 units  CBG 201 - 250: 7 units  CBG 251 - 300: 11 units  CBG 301 - 350: 15 units  CBG 351 - 400: 20 units   insulin aspart 100 UNIT/ML injection Commonly known as: novoLOG Inject 8 Units into the skin 3 (three) times daily with meals.   insulin glargine-yfgn 100 UNIT/ML injection Commonly known as: SEMGLEE Inject 0.15 mLs (15 Units total) into the skin at bedtime.   magnesium chloride 64 MG Tbec SR tablet Commonly known as: SLOW-MAG Take 1 tablet by mouth daily.   metoprolol succinate 50 MG 24 hr tablet Commonly known as: TOPROL-XL Take 1 tablet (50 mg total) by mouth daily. Take with or immediately following a meal.   multivitamin with minerals Tabs tablet Take 1 tablet by mouth daily. Start taking on: May 28, 2022   ondansetron 4 MG disintegrating tablet Commonly known as: Zofran ODT Take 1 tablet (4 mg total) by mouth every 8 (eight) hours as needed for nausea or vomiting.   pantoprazole 40 MG tablet Commonly known as: PROTONIX TAKE 1 TABLET BY MOUTH 2 (TWO) TIMES DAILY. (AM+BEDTIME) What changed: See the new instructions.   Senna Plus 8.6-50 MG tablet Generic drug: senna-docusate Take 1 tablet by mouth daily.    sodium bicarbonate 650 MG tablet Take 1 tablet (650 mg total) by mouth 2 (two) times daily.   torsemide 100 MG tablet Commonly known as: DEMADEX Take 50 mg by mouth 2 (two) times daily.   Vitamin D (Ergocalciferol) 1.25 MG (50000 UNIT) Caps capsule Commonly known as: DRISDOL Take 50,000 Units by mouth every Monday.               Durable Medical Equipment  (From admission, onward)  Start     Ordered   05/12/22 1605  For home use only DME Bedside commode  Once       Comments: Drop Arm Bariatric  Question:  Patient needs a bedside commode to treat with the following condition  Answer:  Weakness   05/12/22 1604            Contact information for follow-up providers     [facility] Follow up.   Why: drop arm bariatric bedside commode, will be delivered to patient's home Contact information: [place] [address] [phone]         [facility] Follow up.   Specialty: [place] Why: Agency will contact you to set up apt times Contact information: [place] [address] [phone]              Contact information for after-discharge care     Destination     [facility] Preferred SNF .   Service: Skilled Nursing Contact information: [place] [phone]                    Discharge Exam: Danley Danker Weights   05/11/22 0501 05/12/22 0033 05/13/22 0335  Weight: 133.8 kg (!) 138.9 kg (!) 139.2 kg   General exam: Appears calm and comfortable  Respiratory system: Clear to auscultation. Respiratory effort normal. Cardiovascular system: S1 & S2 heard, RRR. No JVD, Gastrointestinal system: Abdomen is nondistended, soft and nontender.  Central nervous system: Alert and oriented. No focal neurological deficits. Extremities: Symmetric 5 x 5 power. Skin: No rashes, lesions or ulcers Psychiatry:  Mood & affect  appropriate.    Condition at discharge: fair  The results of significant diagnostics from this hospitalization (including imaging, microbiology, ancillary and laboratory) are listed below for reference.   Imaging Studies: VAS [place] LOWER EXTREMITY VENOUS (DVT)  Result Date: 05/16/2022  Lower Venous DVT Study Patient Name:  ZYLEAH STRAUGHTER  Date of Exam:   05/14/2022 Medical Rec #: [id number]             Accession #:    [id number] Date of Birth: Jul 29, 1965             Patient Gender: F Patient Age:   34 years Exam Location:  [facility] Procedure:      VAS [place] LOWER EXTREMITY VENOUS (DVT) Referring Phys: Hosie Poisson --------------------------------------------------------------------------------  Indications: Pain, and Swelling.  Limitations: Patient pain with probe pressure. Comparison Study: No prior studies. Performing Technologist: Darlin Coco RDMS, RVT  Examination Guidelines: A complete evaluation includes B-mode imaging, spectral Doppler, color Doppler, and power Doppler as needed of all accessible portions of each vessel. Bilateral testing is considered an integral part of a complete examination. Limited examinations for reoccurring indications may be performed as noted. The reflux portion of the exam is performed with the patient in reverse Trendelenburg.  +---------+---------------+---------+-----------+----------+-------------------+ RIGHT    CompressibilityPhasicitySpontaneityPropertiesThrombus Aging      +---------+---------------+---------+-----------+----------+-------------------+ CFV      Full           Yes      Yes                                      +---------+---------------+---------+-----------+----------+-------------------+ SFJ      Full                                                             +---------+---------------+---------+-----------+----------+-------------------+  FV Prox  Full                                                              +---------+---------------+---------+-----------+----------+-------------------+ FV Mid   Full                                                             +---------+---------------+---------+-----------+----------+-------------------+ FV Distal               Yes      Yes                                      +---------+---------------+---------+-----------+----------+-------------------+ PFV      Full                                                             +---------+---------------+---------+-----------+----------+-------------------+ POP      Full           Yes      Yes                                      +---------+---------------+---------+-----------+----------+-------------------+ PTV      Full                                                             +---------+---------------+---------+-----------+----------+-------------------+ PERO                                                  Not well visualized +---------+---------------+---------+-----------+----------+-------------------+   +----+---------------+---------+-----------+----------+--------------+ LEFTCompressibilityPhasicitySpontaneityPropertiesThrombus Aging +----+---------------+---------+-----------+----------+--------------+ CFV Full           Yes      Yes                                 +----+---------------+---------+-----------+----------+--------------+    Summary: RIGHT: - There is no evidence of deep vein thrombosis in the lower extremity. However, portions of this examination were limited- see technologist comments above.  - No cystic structure found in the popliteal fossa.  LEFT: - No evidence of common femoral vein obstruction.  *See table(s) above for measurements and observations. Electronically signed by Jamelle Haring on 05/16/2022 at 4:19:17 PM.    Final    DG Shoulder Left  Result Date: 05/13/2022 CLINICAL DATA:  Severe left shoulder pain.  No injury. EXAM: LEFT SHOULDER  - 2+  VIEW COMPARISON:  None Available. FINDINGS: No acute fracture or dislocation. High-riding humeral head. Mild acromioclavicular and glenohumeral degenerative changes. Soft tissues are unremarkable. IMPRESSION: 1. No acute osseous abnormality. 2. High-riding humeral head suggestive of underlying rotator cuff tear. Electronically Signed   By: Titus Dubin M.D.   On: 05/13/2022 09:24   DG Knee 1-2 Views Right  Result Date: 05/12/2022 CLINICAL DATA:  Knee pain EXAM: RIGHT KNEE - 1-2 VIEW COMPARISON:  None Available. FINDINGS: No fracture or dislocation is seen. Severe tricompartmental degenerative changes, most prominent in the lateral compartment. The visualized soft tissues are unremarkable. No suprapatellar knee joint effusion. IMPRESSION: No fracture or dislocation is seen. Severe tricompartmental degenerative changes, most prominent in the lateral compartment. Electronically Signed   By: Julian Hy M.D.   On: 05/12/2022 17:33   DG Foot Complete Right  Result Date: 05/12/2022 CLINICAL DATA:  Right leg pain EXAM: RIGHT FOOT COMPLETE - 3+ VIEW COMPARISON:  None Available. FINDINGS: No fracture or dislocation is seen. Mild tibiotalar degenerative changes. Mild degenerative changes of the dorsal midfoot. The visualized soft tissues are unremarkable. IMPRESSION: No fracture or dislocation is seen. Mild degenerative changes, as above. Electronically Signed   By: Julian Hy M.D.   On: 05/12/2022 17:32   DG Tibia/Fibula Right  Result Date: 05/12/2022 CLINICAL DATA:  Right leg pain EXAM: RIGHT TIBIA AND FIBULA - 2 VIEW COMPARISON:  None Available. FINDINGS: No fracture or dislocation is seen. Mild tibiotalar degenerative changes. Visualized soft tissues are within normal limits. IMPRESSION: Negative. Electronically Signed   By: Julian Hy M.D.   On: 05/12/2022 17:32   MR LUMBAR SPINE WO CONTRAST  Result Date: 05/12/2022 CLINICAL DATA:  Low back pain, cauda equina syndrome suspected.  Lower extremity weakness. EXAM: MRI LUMBAR SPINE WITHOUT CONTRAST TECHNIQUE: Multiplanar, multisequence MR imaging of the lumbar spine was performed. No intravenous contrast was administered. COMPARISON:  Lumbar spine MRI 03/19/2017. Lumbar spine CT 05/10/2022. FINDINGS: Segmentation: Conventional numbering is assumed with 5 non-rib-bearing, lumbar type vertebral bodies. Alignment: Trace retrolisthesis of L5 on S1. Grade 1 anterolisthesis of L4 on L5, unchanged. Vertebrae: Modic type 1 degenerative endplate marrow signal changes at T11-12. Conus medullaris and cauda equina: Conus extends to the L1-2 level. Conus and cauda equina appear normal. Paraspinal and other soft tissues: Unremarkable. Disc levels: Moderate degenerative changes of the bilateral sacroiliac joints. T12-L1:  Normal. L1-L2:  Normal. L2-L3:  Normal. L3-L4: Small disc bulge and mild bilateral facet arthropathy. No spinal canal stenosis or neural foraminal narrowing. L4-L5: Anterolisthesis with uncovered disc and bilateral facet arthropathy results in moderate spinal canal stenosis and severe bilateral neural foraminal narrowing, worse from prior. Severe bilateral facet arthropathy with joint effusions and periarticular edema. L5-S1: Unchanged right eccentric disc bulge and facet arthropathy resulting in severe right and mild left neural foraminal narrowing. No spinal canal stenosis. IMPRESSION: 1. Slight progression of lower lumbar spondylosis since 2019, worst at L4-L5 where grade 1 anterolisthesis and contributes to moderate spinal canal stenosis and severe bilateral neural foraminal narrowing, worse from prior. 2. Severe bilateral L4-5 facet arthropathy with joint effusions and periarticular edema. This can be a cause of back pain. 3. Unchanged severe right neural foraminal narrowing at L5-S1. Electronically Signed   By: Emmit Alexanders M.D.   On: 05/12/2022 11:13   CT CHEST ABDOMEN PELVIS WO CONTRAST  Result Date: 05/10/2022 CLINICAL DATA:   Vomiting, weakness, and abdominal pain. EXAM: CT CHEST, ABDOMEN AND PELVIS WITHOUT CONTRAST TECHNIQUE: Multidetector CT imaging of the  chest, abdomen and pelvis was performed following the standard protocol without IV contrast. RADIATION DOSE REDUCTION: This exam was performed according to the departmental dose-optimization program which includes automated exposure control, adjustment of the mA and/or kV according to patient size and/or use of iterative reconstruction technique. COMPARISON:  CT abdomen pelvis dated 03/17/2022. FINDINGS: Evaluation of this exam is limited in the absence of intravenous contrast as well as due to streak artifact caused by patient's arms. CT CHEST FINDINGS Cardiovascular: There is no cardiomegaly or pericardial effusion. Coronary vascular calcification of the LAD and RCA. Mild atherosclerotic calcification of the thoracic aorta. No aneurysmal dilatation. The central pulmonary arteries are grossly unremarkable. Mediastinum/Nodes: No hilar or mediastinal adenopathy. The esophagus is grossly unremarkable. No mediastinal fluid collection. Lungs/Pleura: The lungs are clear. There is no pleural effusion or pneumothorax. The central airways are patent. Musculoskeletal: Degenerative changes of the spine. No acute osseous pathology. CT ABDOMEN PELVIS FINDINGS No intra-abdominal free air or free fluid. Hepatobiliary: The liver is grossly unremarkable but suboptimally visualized due to streak artifact caused by patient's arms. Cholecystectomy. Pancreas: Unremarkable. No pancreatic ductal dilatation or surrounding inflammatory changes. Spleen: Normal in size without focal abnormality. Adrenals/Urinary Tract: The adrenal glands are unremarkable. There is no hydronephrosis or nephrolithiasis on either side. The visualized ureters and urinary bladder appear unremarkable. Stomach/Bowel: There is no bowel obstruction or active inflammation. The appendix is normal. Vascular/Lymphatic: Mild  atherosclerotic calcification of the abdominal aorta. The IVC is unremarkable. No portal venous gas. There is no adenopathy. Reproductive: The uterus is anteverted. Similar appearance of a 4 cm left uterine fundus exophytic fibroid. Other: None Musculoskeletal: Degenerative changes of the spine. No acute osseous pathology. IMPRESSION: 1. No acute intrathoracic, abdominal, or pelvic pathology. 2. No bowel obstruction. Normal appendix. 3.  Aortic Atherosclerosis (ICD10-I70.0). Electronically Signed   By: Anner Crete M.D.   On: 05/10/2022 23:32   CT L-SPINE NO CHARGE  Result Date: 05/10/2022 CLINICAL DATA:  Right-sided pain and generalized weakness. EXAM: CT THORACIC AND LUMBAR SPINE WITHOUT CONTRAST TECHNIQUE: Multidetector CT imaging of the thoracic and lumbar spine was performed without contrast. Multiplanar CT image reconstructions were also generated. RADIATION DOSE REDUCTION: This exam was performed according to the departmental dose-optimization program which includes automated exposure control, adjustment of the mA and/or kV according to patient size and/or use of iterative reconstruction technique. COMPARISON:  CTA chest abdomen pelvis dated 05/10/2022 and CT abdomen pelvis dated 03/17/2022. FINDINGS: CT THORACIC SPINE FINDINGS Alignment: No acute subluxation. Vertebrae: No acute fracture. Paraspinal and other soft tissues: Negative. Disc levels: No acute findings. Multilevel degenerative changes most prominent at T7-T8 with disc space narrowing, endplate irregularity and spurring/osteophyte. CT LUMBAR SPINE FINDINGS Segmentation: 5 lumbar type vertebrae. Alignment: No acute subluxation.  Grade 1 L4-L5 anterolisthesis. Vertebrae: No acute fracture. Paraspinal and other soft tissues: Negative. Disc levels: No acute findings. Degenerative changes primarily at L5-S1 with anterior osteophyte. IMPRESSION: 1. No acute/traumatic thoracic or lumbar spine pathology. 2. Degenerative changes. Electronically  Signed   By: Anner Crete M.D.   On: 05/10/2022 23:25   CT T-SPINE NO CHARGE  Result Date: 05/10/2022 CLINICAL DATA:  Right-sided pain and generalized weakness. EXAM: CT THORACIC AND LUMBAR SPINE WITHOUT CONTRAST TECHNIQUE: Multidetector CT imaging of the thoracic and lumbar spine was performed without contrast. Multiplanar CT image reconstructions were also generated. RADIATION DOSE REDUCTION: This exam was performed according to the departmental dose-optimization program which includes automated exposure control, adjustment of the mA and/or kV according to patient size and/or  use of iterative reconstruction technique. COMPARISON:  CTA chest abdomen pelvis dated 05/10/2022 and CT abdomen pelvis dated 03/17/2022. FINDINGS: CT THORACIC SPINE FINDINGS Alignment: No acute subluxation. Vertebrae: No acute fracture. Paraspinal and other soft tissues: Negative. Disc levels: No acute findings. Multilevel degenerative changes most prominent at T7-T8 with disc space narrowing, endplate irregularity and spurring/osteophyte. CT LUMBAR SPINE FINDINGS Segmentation: 5 lumbar type vertebrae. Alignment: No acute subluxation.  Grade 1 L4-L5 anterolisthesis. Vertebrae: No acute fracture. Paraspinal and other soft tissues: Negative. Disc levels: No acute findings. Degenerative changes primarily at L5-S1 with anterior osteophyte. IMPRESSION: 1. No acute/traumatic thoracic or lumbar spine pathology. 2. Degenerative changes. Electronically Signed   By: Anner Crete M.D.   On: 05/10/2022 23:25   CT Head Wo Contrast  Result Date: 05/10/2022 CLINICAL DATA:  Weakness and pain, initial encounter EXAM: CT HEAD WITHOUT CONTRAST CT CERVICAL SPINE WITHOUT CONTRAST TECHNIQUE: Multidetector CT imaging of the head and cervical spine was performed following the standard protocol without intravenous contrast. Multiplanar CT image reconstructions of the cervical spine were also generated. RADIATION DOSE REDUCTION: This exam was performed  according to the departmental dose-optimization program which includes automated exposure control, adjustment of the mA and/or kV according to patient size and/or use of iterative reconstruction technique. COMPARISON:  None Available. FINDINGS: CT HEAD FINDINGS Brain: No evidence of acute infarction, hemorrhage, hydrocephalus, extra-axial collection or mass lesion/mass effect. Mild atrophic changes are noted. Vascular: No hyperdense vessel or unexpected calcification. Skull: Normal. Negative for fracture or focal lesion. Sinuses/Orbits: No acute finding. Other: None. CT CERVICAL SPINE FINDINGS Alignment: Mild straightening of the normal cervical lordosis is noted. Skull base and vertebrae: In cervical segments are well visualized. Vertebral body height is well maintained. No acute fracture or acute facet abnormality is noted. Mild osteophytic changes are noted worst at C5-6. Posterior fusion defect is noted at C1 posteriorly felt to be congenital in nature. The odontoid is within normal limits. Soft tissues and spinal canal: Surrounding soft tissue structures are within normal limits. Upper chest: Visualized lung apices are within normal limits. Other: None IMPRESSION: CT of the head: No acute intracranial abnormality noted. Mild atrophic changes are noted. CT of the cervical spine: Mild degenerative change without acute abnormality. Electronically Signed   By: Inez Catalina M.D.   On: 05/10/2022 23:21   CT Cervical Spine Wo Contrast  Result Date: 05/10/2022 CLINICAL DATA:  Weakness and pain, initial encounter EXAM: CT HEAD WITHOUT CONTRAST CT CERVICAL SPINE WITHOUT CONTRAST TECHNIQUE: Multidetector CT imaging of the head and cervical spine was performed following the standard protocol without intravenous contrast. Multiplanar CT image reconstructions of the cervical spine were also generated. RADIATION DOSE REDUCTION: This exam was performed according to the departmental dose-optimization program which includes  automated exposure control, adjustment of the mA and/or kV according to patient size and/or use of iterative reconstruction technique. COMPARISON:  None Available. FINDINGS: CT HEAD FINDINGS Brain: No evidence of acute infarction, hemorrhage, hydrocephalus, extra-axial collection or mass lesion/mass effect. Mild atrophic changes are noted. Vascular: No hyperdense vessel or unexpected calcification. Skull: Normal. Negative for fracture or focal lesion. Sinuses/Orbits: No acute finding. Other: None. CT CERVICAL SPINE FINDINGS Alignment: Mild straightening of the normal cervical lordosis is noted. Skull base and vertebrae: In cervical segments are well visualized. Vertebral body height is well maintained. No acute fracture or acute facet abnormality is noted. Mild osteophytic changes are noted worst at C5-6. Posterior fusion defect is noted at C1 posteriorly felt to be congenital in nature.  The odontoid is within normal limits. Soft tissues and spinal canal: Surrounding soft tissue structures are within normal limits. Upper chest: Visualized lung apices are within normal limits. Other: None IMPRESSION: CT of the head: No acute intracranial abnormality noted. Mild atrophic changes are noted. CT of the cervical spine: Mild degenerative change without acute abnormality. Electronically Signed   By: Inez Catalina M.D.   On: 05/10/2022 23:21    Microbiology: Results for orders placed or performed during the hospital encounter of 05/10/22  Resp panel by RT-PCR (RSV, Flu A&B, Covid) Anterior Nasal Swab     Status: None   Collection Time: 05/10/22  7:21 PM   Specimen: Anterior Nasal Swab  Result Value Ref Range Status   SARS Coronavirus 2 by RT PCR NEGATIVE NEGATIVE Final   Influenza A by PCR NEGATIVE NEGATIVE Final   Influenza B by PCR NEGATIVE NEGATIVE Final    Comment: (NOTE) The Xpert Xpress SARS-CoV-2/FLU/RSV plus assay is intended as an aid in the diagnosis of influenza from Nasopharyngeal swab specimens  and should not be used as a sole basis for treatment. Nasal washings and aspirates are unacceptable for Xpert Xpress SARS-CoV-2/FLU/RSV testing.  Fact Sheet for Patients: EntrepreneurPulse.com.au  Fact Sheet for Healthcare Providers: IncredibleEmployment.be  This test is not yet approved or cleared by the [place] FDA and has been authorized for detection and/or diagnosis of SARS-CoV-2 by FDA under an Emergency Use Authorization (EUA). This EUA will remain in effect (meaning this test can be used) for the duration of the COVID-19 declaration under Section 564(b)(1) of the Act, 21 U.S.C. section 360bbb-3(b)(1), unless the authorization is terminated or revoked.     Resp Syncytial Virus by PCR NEGATIVE NEGATIVE Final    Comment: (NOTE) Fact Sheet for Patients: EntrepreneurPulse.com.au  Fact Sheet for Healthcare Providers: IncredibleEmployment.be  This test is not yet approved or cleared by the [place] FDA and has been authorized for detection and/or diagnosis of SARS-CoV-2 by FDA under an Emergency Use Authorization (EUA). This EUA will remain in effect (meaning this test can be used) for the duration of the COVID-19 declaration under Section 564(b)(1) of the Act, 21 U.S.C. section 360bbb-3(b)(1), unless the authorization is terminated or revoked.  Performed at [facility], [place] [address] [place], [place] [address]     Labs: CBC: Recent Labs  Lab 05/22/22 [phone] 05/23/22 0405 05/24/22 0458 05/25/22 0310 05/26/22 0328  WBC 15.8* 16.4* 15.7* 17.7* 17.6*  HGB 7.4* 7.5* 7.3* 7.5* 7.6*  HCT 24.4* 24.4* 23.2* 24.3* 25.2*  MCV 87.1 87.1 85.6 86.8 86.9  PLT 644* 662* 620* 657* [id number]*   Basic Metabolic Panel: Recent Labs  Lab 05/22/22 0849 05/23/22 0405 05/24/22 0458 05/25/22 0310 05/26/22 0328  NA 132* 131* 132* 130* 130*  K 4.2 4.1 4.2 4.6 4.2  CL 97* 96* 97* 94* 94*  CO2 24  23 23 22 23   GLUCOSE 263* 198* 138* 241* 228*  BUN 86* 88* 90* 93* 96*  CREATININE 3.51* 3.22* 3.08* 3.36* 3.87*  CALCIUM 9.1 9.3 9.2 9.3 9.4  MG  --  1.9  --  2.0  --    Liver Function Tests: Recent Labs  Lab 05/23/22 0405  AST 20  ALT 24  ALKPHOS 94  BILITOT 0.8  PROT 7.7  ALBUMIN 2.0*   CBG: Recent Labs  Lab 05/26/22 1202 05/26/22 1620 05/26/22 2156 05/27/22 0742 05/27/22 1205  GLUCAP 143* 177* 187* 205* 172*    Discharge time spent: 36 MINUTES.  Signed: Hosie Poisson, MD [facility] 05/27/2022

## 2022-05-28 ENCOUNTER — Inpatient Hospital Stay (HOSPITAL_COMMUNITY)
Admission: EM | Admit: 2022-05-28 | Discharge: 2022-06-04 | DRG: 683 | Disposition: A | Discharge status: Skilled Nursing Facility | Payer: 59 | Source: Skilled Nursing Facility | Attending: Internal Medicine | Admitting: Internal Medicine

## 2022-05-28 ENCOUNTER — Emergency Department (HOSPITAL_COMMUNITY): Payer: 59

## 2022-05-28 ENCOUNTER — Other Ambulatory Visit: Payer: Self-pay

## 2022-05-28 ENCOUNTER — Encounter (HOSPITAL_COMMUNITY): Payer: Self-pay

## 2022-05-28 DIAGNOSIS — I428 Other cardiomyopathies: Secondary | ICD-10-CM | POA: Diagnosis present

## 2022-05-28 DIAGNOSIS — Z6841 Body Mass Index (BMI) 40.0 and over, adult: Secondary | ICD-10-CM

## 2022-05-28 DIAGNOSIS — M48062 Spinal stenosis, lumbar region with neurogenic claudication: Secondary | ICD-10-CM | POA: Diagnosis present

## 2022-05-28 DIAGNOSIS — N179 Acute kidney failure, unspecified: Principal | ICD-10-CM | POA: Diagnosis present

## 2022-05-28 DIAGNOSIS — G8929 Other chronic pain: Secondary | ICD-10-CM | POA: Diagnosis present

## 2022-05-28 DIAGNOSIS — Z880 Allergy status to penicillin: Secondary | ICD-10-CM

## 2022-05-28 DIAGNOSIS — K3184 Gastroparesis: Secondary | ICD-10-CM | POA: Diagnosis present

## 2022-05-28 DIAGNOSIS — Z7901 Long term (current) use of anticoagulants: Secondary | ICD-10-CM

## 2022-05-28 DIAGNOSIS — N184 Chronic kidney disease, stage 4 (severe): Secondary | ICD-10-CM | POA: Diagnosis present

## 2022-05-28 DIAGNOSIS — Z8673 Personal history of transient ischemic attack (TIA), and cerebral infarction without residual deficits: Secondary | ICD-10-CM

## 2022-05-28 DIAGNOSIS — E1122 Type 2 diabetes mellitus with diabetic chronic kidney disease: Secondary | ICD-10-CM | POA: Diagnosis present

## 2022-05-28 DIAGNOSIS — Z993 Dependence on wheelchair: Secondary | ICD-10-CM

## 2022-05-28 DIAGNOSIS — I10 Essential (primary) hypertension: Secondary | ICD-10-CM | POA: Diagnosis present

## 2022-05-28 DIAGNOSIS — D72829 Elevated white blood cell count, unspecified: Secondary | ICD-10-CM | POA: Diagnosis present

## 2022-05-28 DIAGNOSIS — G9340 Encephalopathy, unspecified: Secondary | ICD-10-CM | POA: Diagnosis present

## 2022-05-28 DIAGNOSIS — Z9049 Acquired absence of other specified parts of digestive tract: Secondary | ICD-10-CM

## 2022-05-28 DIAGNOSIS — G9341 Metabolic encephalopathy: Secondary | ICD-10-CM | POA: Diagnosis present

## 2022-05-28 DIAGNOSIS — D75839 Thrombocytosis, unspecified: Secondary | ICD-10-CM | POA: Diagnosis present

## 2022-05-28 DIAGNOSIS — K219 Gastro-esophageal reflux disease without esophagitis: Secondary | ICD-10-CM | POA: Diagnosis present

## 2022-05-28 DIAGNOSIS — Z91041 Radiographic dye allergy status: Secondary | ICD-10-CM

## 2022-05-28 DIAGNOSIS — E785 Hyperlipidemia, unspecified: Secondary | ICD-10-CM | POA: Diagnosis present

## 2022-05-28 DIAGNOSIS — Z886 Allergy status to analgesic agent status: Secondary | ICD-10-CM

## 2022-05-28 DIAGNOSIS — Z833 Family history of diabetes mellitus: Secondary | ICD-10-CM

## 2022-05-28 DIAGNOSIS — C7A010 Malignant carcinoid tumor of the duodenum: Secondary | ICD-10-CM | POA: Diagnosis present

## 2022-05-28 DIAGNOSIS — Z885 Allergy status to narcotic agent status: Secondary | ICD-10-CM

## 2022-05-28 DIAGNOSIS — I480 Paroxysmal atrial fibrillation: Secondary | ICD-10-CM | POA: Diagnosis present

## 2022-05-28 DIAGNOSIS — E119 Type 2 diabetes mellitus without complications: Secondary | ICD-10-CM

## 2022-05-28 DIAGNOSIS — E1143 Type 2 diabetes mellitus with diabetic autonomic (poly)neuropathy: Secondary | ICD-10-CM | POA: Diagnosis present

## 2022-05-28 DIAGNOSIS — D509 Iron deficiency anemia, unspecified: Secondary | ICD-10-CM | POA: Diagnosis present

## 2022-05-28 DIAGNOSIS — Z794 Long term (current) use of insulin: Secondary | ICD-10-CM

## 2022-05-28 DIAGNOSIS — Z79899 Other long term (current) drug therapy: Secondary | ICD-10-CM

## 2022-05-28 DIAGNOSIS — E861 Hypovolemia: Secondary | ICD-10-CM | POA: Diagnosis present

## 2022-05-28 DIAGNOSIS — E871 Hypo-osmolality and hyponatremia: Secondary | ICD-10-CM | POA: Diagnosis present

## 2022-05-28 DIAGNOSIS — Z888 Allergy status to other drugs, medicaments and biological substances status: Secondary | ICD-10-CM

## 2022-05-28 DIAGNOSIS — E860 Dehydration: Secondary | ICD-10-CM | POA: Diagnosis present

## 2022-05-28 DIAGNOSIS — Z8509 Personal history of malignant neoplasm of other digestive organs: Secondary | ICD-10-CM

## 2022-05-28 DIAGNOSIS — Z765 Malingerer [conscious simulation]: Secondary | ICD-10-CM

## 2022-05-28 DIAGNOSIS — Z8249 Family history of ischemic heart disease and other diseases of the circulatory system: Secondary | ICD-10-CM

## 2022-05-28 DIAGNOSIS — I130 Hypertensive heart and chronic kidney disease with heart failure and stage 1 through stage 4 chronic kidney disease, or unspecified chronic kidney disease: Secondary | ICD-10-CM | POA: Diagnosis present

## 2022-05-28 DIAGNOSIS — M109 Gout, unspecified: Secondary | ICD-10-CM | POA: Diagnosis present

## 2022-05-28 DIAGNOSIS — M797 Fibromyalgia: Secondary | ICD-10-CM | POA: Diagnosis present

## 2022-05-28 DIAGNOSIS — R7989 Other specified abnormal findings of blood chemistry: Secondary | ICD-10-CM | POA: Diagnosis present

## 2022-05-28 DIAGNOSIS — F05 Delirium due to known physiological condition: Secondary | ICD-10-CM | POA: Diagnosis present

## 2022-05-28 DIAGNOSIS — I5022 Chronic systolic (congestive) heart failure: Secondary | ICD-10-CM | POA: Diagnosis present

## 2022-05-28 DIAGNOSIS — D631 Anemia in chronic kidney disease: Secondary | ICD-10-CM | POA: Diagnosis present

## 2022-05-28 DIAGNOSIS — E1165 Type 2 diabetes mellitus with hyperglycemia: Secondary | ICD-10-CM

## 2022-05-28 DIAGNOSIS — R4182 Altered mental status, unspecified: Principal | ICD-10-CM

## 2022-05-28 DIAGNOSIS — Z79891 Long term (current) use of opiate analgesic: Secondary | ICD-10-CM

## 2022-05-28 DIAGNOSIS — D649 Anemia, unspecified: Secondary | ICD-10-CM | POA: Diagnosis present

## 2022-05-28 LAB — I-STAT VENOUS BLOOD GAS, ED
Acid-Base Excess: 0 mmol/L (ref 0.0–2.0)
Bicarbonate: 22.9 mmol/L (ref 20.0–28.0)
Calcium, Ion: 1.03 mmol/L — ABNORMAL LOW (ref 1.15–1.40)
HCT: 24 % — ABNORMAL LOW (ref 36.0–46.0)
Hemoglobin: 8.2 g/dL — ABNORMAL LOW (ref 12.0–15.0)
O2 Saturation: 66 %
Potassium: 4.9 mmol/L (ref 3.5–5.1)
Sodium: 130 mmol/L — ABNORMAL LOW (ref 135–145)
TCO2: 24 mmol/L (ref 22–32)
pCO2, Ven: 29.6 mmHg — ABNORMAL LOW (ref 44–60)
pH, Ven: 7.497 — ABNORMAL HIGH (ref 7.25–7.43)
pO2, Ven: 31 mmHg — CL (ref 32–45)

## 2022-05-28 LAB — COMPREHENSIVE METABOLIC PANEL
ALT: 17 U/L (ref 0–44)
AST: 22 U/L (ref 15–41)
Albumin: 2.4 g/dL — ABNORMAL LOW (ref 3.5–5.0)
Alkaline Phosphatase: 91 U/L (ref 38–126)
Anion gap: 15 (ref 5–15)
BUN: 113 mg/dL — ABNORMAL HIGH (ref 6–20)
CO2: 20 mmol/L — ABNORMAL LOW (ref 22–32)
Calcium: 9.2 mg/dL (ref 8.9–10.3)
Chloride: 93 mmol/L — ABNORMAL LOW (ref 98–111)
Creatinine, Ser: 4.59 mg/dL — ABNORMAL HIGH (ref 0.44–1.00)
GFR, Estimated: 10 mL/min — ABNORMAL LOW (ref >60)
Glucose, Bld: 193 mg/dL — ABNORMAL HIGH (ref 70–99)
Potassium: 4.8 mmol/L (ref 3.5–5.1)
Sodium: 128 mmol/L — ABNORMAL LOW (ref 135–145)
Total Bilirubin: 0.9 mg/dL (ref 0.3–1.2)
Total Protein: 8 g/dL (ref 6.5–8.1)

## 2022-05-28 LAB — CBC WITH DIFFERENTIAL/PLATELET
Abs Immature Granulocytes: 0.18 10*3/uL — ABNORMAL HIGH (ref 0.00–0.07)
Basophils Absolute: 0.1 10*3/uL (ref 0.0–0.1)
Basophils Relative: 0 %
Eosinophils Absolute: 0.2 10*3/uL (ref 0.0–0.5)
Eosinophils Relative: 1 %
HCT: 24.4 % — ABNORMAL LOW (ref 36.0–46.0)
Hemoglobin: 7.2 g/dL — ABNORMAL LOW (ref 12.0–15.0)
Immature Granulocytes: 1 %
Lymphocytes Relative: 11 %
Lymphs Abs: 1.8 10*3/uL (ref 0.7–4.0)
MCH: 26.6 pg (ref 26.0–34.0)
MCHC: 29.5 g/dL — ABNORMAL LOW (ref 30.0–36.0)
MCV: 90 fL (ref 80.0–100.0)
Monocytes Absolute: 1.1 10*3/uL — ABNORMAL HIGH (ref 0.1–1.0)
Monocytes Relative: 6 %
Neutro Abs: 13.7 10*3/uL — ABNORMAL HIGH (ref 1.7–7.7)
Neutrophils Relative %: 81 %
Platelets: 602 10*3/uL — ABNORMAL HIGH (ref 150–400)
RBC: 2.71 MIL/uL — ABNORMAL LOW (ref 3.87–5.11)
RDW: 19.8 % — ABNORMAL HIGH (ref 11.5–15.5)
WBC: 17 10*3/uL — ABNORMAL HIGH (ref 4.0–10.5)
nRBC: 0 % (ref 0.0–0.2)

## 2022-05-28 LAB — CBG MONITORING, ED
Glucose-Capillary: 173 mg/dL — ABNORMAL HIGH (ref 70–99)
Glucose-Capillary: 174 mg/dL — ABNORMAL HIGH (ref 70–99)

## 2022-05-28 LAB — I-STAT BETA HCG BLOOD, ED (MC, WL, AP ONLY): I-stat hCG, quantitative: <5 m[IU]/mL (ref <5)

## 2022-05-28 LAB — AMMONIA: Ammonia: 19 umol/L (ref 9–35)

## 2022-05-28 LAB — ETHANOL: Alcohol, Ethyl (B): <10 mg/dL (ref <10)

## 2022-05-28 MED ORDER — FENTANYL CITRATE PF 50 MCG/ML IJ SOSY
50.0000 ug | PREFILLED_SYRINGE | Freq: Once | INTRAMUSCULAR | Status: AC
  Administered 2022-05-28: 50 ug via INTRAVENOUS
  Filled 2022-05-28: qty 1

## 2022-05-28 MED ORDER — HALOPERIDOL LACTATE 5 MG/ML IJ SOLN: 5.0000 mg | INTRAMUSCULAR | Status: DC

## 2022-05-28 MED ORDER — MIDAZOLAM HCL 2 MG/2ML IJ SOLN: 4.0000 mg | Freq: Once | INTRAMUSCULAR | Status: DC

## 2022-05-28 NOTE — ED Notes (Signed)
Pt refusing CT, states she is a lot of pain all over her body and will try to do the CT if she gets some pain meds. [name] MD made aware

## 2022-05-28 NOTE — ED Provider Notes (Signed)
[facility] Provider Note   [name]: [id number] Arrival date & time: 05/28/22  1717     History {Add pertinent medical, surgical, social history, OB history to HPI:1} Chief Complaint  Patient presents with   Altered Mental Status    Deborah [name] is a 57 y.o. female.  57 year old female with a history of insulin-dependent diabetes, CHF, carcinoid tumor, and stroke who presents emergency department with altered mental status.  Patient was recently hospitalized and discharged for hyperosmolar nonketotic state and sent to [facility].  Today they reported that she was altered and she typically is alert and oriented x 4 per EMS report.  No focal neurologic deficits were reported.  Had blood sugar in the field that was 204.  Patient unable to give additional history.  [place] called but is not picking up.       Home Medications Prior to Admission medications   Medication Sig Start Date End Date Taking? Authorizing Provider  albuterol (PROVENTIL) (2.5 MG/3ML) 0.083% nebulizer solution Take 3 mLs (2.5 mg total) by nebulization every 6 (six) hours as needed for wheezing or shortness of breath. 04/06/19   Scot Jun, NP  apixaban (ELIQUIS) 5 MG TABS tablet Take 5 mg by mouth 2 (two) times daily. 12/03/21   [provider]  atorvastatin (LIPITOR) 10 MG tablet Take 10 mg by mouth daily. 12/03/21   [provider]  cetirizine (ZYRTEC) 10 MG tablet TAKE 1 TABLET (10 MG TOTAL) BY MOUTH DAILY (AM) Patient taking differently: Take 10 mg by mouth daily. 06/30/21   Raulkar, Clide Deutscher, MD  diclofenac Sodium (VOLTAREN) 1 % GEL Apply 4 g topically 4 (four) times daily. 05/27/22   Hosie Poisson, MD  DULoxetine HCl 40 MG CPEP Take 40 mg by mouth in the morning and at bedtime. 03/22/22   Shary Key, DO  EASY COMFORT PEN NEEDLES 31G X 5 MM MISC USE 3 TIMES A DAY FOR INSULIN ADMINISTRATION 11/14/19   Meccariello, Bernita Raisin, MD   famotidine (PEPCID) 20 MG tablet Take 1 tablet (20 mg total) by mouth daily. 03/22/22 07/20/22  Shary Key, DO  ferrous sulfate 325 (65 FE) MG tablet Take 1 tablet (325 mg total) by mouth 2 (two) times daily with a meal. 05/27/22   Hosie Poisson, MD  fluticasone (FLONASE) 50 MCG/ACT nasal spray Place 2 sprays into both nostrils daily as needed for allergies or rhinitis. Patient taking differently: Place 1 spray into both nostrils daily as needed for allergies. 12/19/18   Rai, Vernelle Emerald, MD  folic acid (FOLVITE) 1 MG tablet Take 1 mg by mouth daily. 04/07/21   [provider]  HYDROmorphone (DILAUDID) 2 MG tablet Take 0.5-1 tablets (1-2 mg total) by mouth every 8 (eight) hours as needed for up to 3 days for severe pain. 05/27/22 05/30/22  Hosie Poisson, MD  insulin aspart (NOVOLOG) 100 UNIT/ML injection CBG 70 - 120: 0 units  CBG 121 - 150: 3 units  CBG 151 - 200: 4 units  CBG 201 - 250: 7 units  CBG 251 - 300: 11 units  CBG 301 - 350: 15 units  CBG 351 - 400: 20 units 05/27/22   Hosie Poisson, MD  insulin aspart (NOVOLOG) 100 UNIT/ML injection Inject 8 Units into the skin 3 (three) times daily with meals. 05/27/22   Hosie Poisson, MD  insulin glargine-yfgn (SEMGLEE) 100 UNIT/ML injection Inject 0.15 mLs (15 Units total) into the skin at bedtime. 05/27/22  Hosie Poisson, MD  magnesium chloride (SLOW-MAG) 64 MG TBEC SR tablet Take 1 tablet by mouth daily. 12/07/21   [provider]  metoprolol succinate (TOPROL-XL) 50 MG 24 hr tablet Take 1 tablet (50 mg total) by mouth daily. Take with or immediately following a meal. 11/24/20 03/18/23  Enzo Bi, MD  Multiple Vitamin (MULTIVITAMIN WITH MINERALS) TABS tablet Take 1 tablet by mouth daily. 05/28/22   Hosie Poisson, MD  ondansetron (ZOFRAN ODT) 4 MG disintegrating tablet Take 1 tablet (4 mg total) by mouth every 8 (eight) hours as needed for nausea or vomiting. 05/30/21   Sherwood Gambler, MD  pantoprazole (PROTONIX) 40 MG tablet TAKE 1  TABLET BY MOUTH 2 (TWO) TIMES DAILY. (AM+BEDTIME) Patient taking differently: Take 40 mg by mouth 2 (two) times daily. 03/11/22   Raulkar, Clide Deutscher, MD  SENNA PLUS 8.6-50 MG tablet Take 1 tablet by mouth daily. Patient not taking: Reported on 05/11/2022 05/19/21   [provider]  sodium bicarbonate 650 MG tablet Take 1 tablet (650 mg total) by mouth 2 (two) times daily. 04/20/21   Jennye Boroughs, MD  torsemide (DEMADEX) 100 MG tablet Take 50 mg by mouth 2 (two) times daily. 12/03/21   [provider]  Vitamin D, Ergocalciferol, (DRISDOL) 1.25 MG (50000 UNIT) CAPS capsule Take 50,000 Units by mouth every Monday. 04/07/21   [provider]      Allergies    Diazepam, Gabapentin, Iodinated contrast media, Isovue [iopamidol], Lisinopril, Metoclopramide, Nsaids, Penicillins, Tolmetin, Dicyclomine, Acetaminophen, Cyclobenzaprine, Oxycodone, Rifamycins, and Tramadol    Review of Systems   Review of Systems  Physical Exam Updated Vital Signs LMP 10/10/2012  Physical Exam Vitals and nursing note reviewed.  Constitutional:      General: She is not in acute distress.    Appearance: She is well-developed.     Comments: Alert and oriented to self and place but did not know year.  HENT:     Head: Normocephalic and atraumatic.     Right Ear: External ear normal.     Left Ear: External ear normal.     Nose: Nose normal.  Eyes:     Extraocular Movements: Extraocular movements intact.     Conjunctiva/sclera: Conjunctivae normal.     Pupils: Pupils are equal, round, and reactive to light.     Comments: Pupils 3 mm  Cardiovascular:     Rate and Rhythm: Normal rate and regular rhythm.  Pulmonary:     Effort: Pulmonary effort is normal. No respiratory distress.  Abdominal:     General: Abdomen is flat.     Palpations: Abdomen is soft.  Musculoskeletal:     Cervical back: Normal range of motion and neck supple.     Right lower leg: No edema.     Left lower leg: No edema.   Skin:    General: Skin is warm and dry.  Neurological:     Mental Status: She is alert.     Sensory: No sensory deficit.     Comments: No aphasia noted.  Patient has difficulty complying with strength exam due to pain and her left shoulder and bilateral lower extremities.  Intact sensation to light touch.  Cranial nerves II through XII intact.  Psychiatric:        Mood and Affect: Mood normal.     ED Results / Procedures / Treatments   Labs (all labs ordered are listed, but only abnormal results are displayed) Labs Reviewed  COMPREHENSIVE METABOLIC PANEL  CBC WITH  DIFFERENTIAL/PLATELET  URINALYSIS, ROUTINE W REFLEX MICROSCOPIC  AMMONIA  BLOOD GAS, VENOUS  ETHANOL  RAPID URINE DRUG SCREEN, HOSP PERFORMED  CBG MONITORING, ED  I-STAT BETA HCG BLOOD, ED ([facility], [facility], [facility] ONLY)    EKG None  Radiology No results found.  Procedures Procedures  {Document cardiac monitor, telemetry assessment procedure when appropriate:1}  Medications Ordered in ED Medications - No data to display  ED Course/ Medical Decision Making/ A&P   {   Click here for ABCD2, HEART and other calculatorsREFRESH Note before signing :1}                          Medical Decision Making Amount and/or Complexity of Data Reviewed Labs: ordered. Radiology: ordered.   ***  {Document critical care time when appropriate:1} {Document review of labs and clinical decision tools ie heart score, Chads2Vasc2 etc:1}  {Document your independent review of radiology images, and any outside records:1} {Document your discussion with family members, caretakers, and with consultants:1} {Document social determinants of health affecting pt's care:1} {Document your decision making why or why not admission, treatments were needed:1} Final Clinical Impression(s) / ED Diagnoses Final diagnoses:  None    Rx / DC Orders ED Discharge Orders     None

## 2022-05-28 NOTE — ED Triage Notes (Signed)
Pt BIB EMS from facility for AMS, has been sitting on the side of the bed all day, has been confused, staff was not aware as to why the patient was at the facility, pt is AO to self, legs are swollen, small pupils.  CBG 204

## 2022-05-28 NOTE — ED Notes (Signed)
Pt continuing to refuse CT, [name] MD made aware

## 2022-05-29 ENCOUNTER — Observation Stay (HOSPITAL_COMMUNITY): Payer: 59

## 2022-05-29 ENCOUNTER — Inpatient Hospital Stay (HOSPITAL_COMMUNITY): Payer: 59

## 2022-05-29 ENCOUNTER — Encounter (HOSPITAL_COMMUNITY): Payer: Self-pay | Admitting: Family Medicine

## 2022-05-29 ENCOUNTER — Emergency Department (HOSPITAL_COMMUNITY): Payer: 59

## 2022-05-29 DIAGNOSIS — R410 Disorientation, unspecified: Secondary | ICD-10-CM | POA: Diagnosis not present

## 2022-05-29 DIAGNOSIS — E1165 Type 2 diabetes mellitus with hyperglycemia: Secondary | ICD-10-CM

## 2022-05-29 DIAGNOSIS — R4182 Altered mental status, unspecified: Secondary | ICD-10-CM

## 2022-05-29 DIAGNOSIS — Z6841 Body Mass Index (BMI) 40.0 and over, adult: Secondary | ICD-10-CM | POA: Diagnosis not present

## 2022-05-29 DIAGNOSIS — M48062 Spinal stenosis, lumbar region with neurogenic claudication: Secondary | ICD-10-CM

## 2022-05-29 DIAGNOSIS — G9340 Encephalopathy, unspecified: Secondary | ICD-10-CM | POA: Diagnosis present

## 2022-05-29 DIAGNOSIS — K219 Gastro-esophageal reflux disease without esophagitis: Secondary | ICD-10-CM | POA: Diagnosis present

## 2022-05-29 DIAGNOSIS — E1122 Type 2 diabetes mellitus with diabetic chronic kidney disease: Secondary | ICD-10-CM | POA: Diagnosis present

## 2022-05-29 DIAGNOSIS — E871 Hypo-osmolality and hyponatremia: Secondary | ICD-10-CM | POA: Diagnosis present

## 2022-05-29 DIAGNOSIS — D631 Anemia in chronic kidney disease: Secondary | ICD-10-CM | POA: Diagnosis present

## 2022-05-29 DIAGNOSIS — E860 Dehydration: Secondary | ICD-10-CM | POA: Diagnosis present

## 2022-05-29 DIAGNOSIS — E785 Hyperlipidemia, unspecified: Secondary | ICD-10-CM | POA: Diagnosis present

## 2022-05-29 DIAGNOSIS — N179 Acute kidney failure, unspecified: Secondary | ICD-10-CM | POA: Diagnosis present

## 2022-05-29 DIAGNOSIS — F05 Delirium due to known physiological condition: Secondary | ICD-10-CM | POA: Diagnosis present

## 2022-05-29 DIAGNOSIS — D649 Anemia, unspecified: Secondary | ICD-10-CM

## 2022-05-29 DIAGNOSIS — E1143 Type 2 diabetes mellitus with diabetic autonomic (poly)neuropathy: Secondary | ICD-10-CM | POA: Diagnosis present

## 2022-05-29 DIAGNOSIS — I480 Paroxysmal atrial fibrillation: Secondary | ICD-10-CM

## 2022-05-29 DIAGNOSIS — M109 Gout, unspecified: Secondary | ICD-10-CM | POA: Diagnosis present

## 2022-05-29 DIAGNOSIS — Z794 Long term (current) use of insulin: Secondary | ICD-10-CM | POA: Diagnosis not present

## 2022-05-29 DIAGNOSIS — N184 Chronic kidney disease, stage 4 (severe): Secondary | ICD-10-CM | POA: Diagnosis present

## 2022-05-29 DIAGNOSIS — G8929 Other chronic pain: Secondary | ICD-10-CM | POA: Diagnosis present

## 2022-05-29 DIAGNOSIS — I428 Other cardiomyopathies: Secondary | ICD-10-CM | POA: Diagnosis present

## 2022-05-29 DIAGNOSIS — M797 Fibromyalgia: Secondary | ICD-10-CM | POA: Diagnosis present

## 2022-05-29 DIAGNOSIS — I130 Hypertensive heart and chronic kidney disease with heart failure and stage 1 through stage 4 chronic kidney disease, or unspecified chronic kidney disease: Secondary | ICD-10-CM | POA: Diagnosis present

## 2022-05-29 DIAGNOSIS — I5022 Chronic systolic (congestive) heart failure: Secondary | ICD-10-CM | POA: Diagnosis present

## 2022-05-29 DIAGNOSIS — R7989 Other specified abnormal findings of blood chemistry: Secondary | ICD-10-CM | POA: Diagnosis present

## 2022-05-29 DIAGNOSIS — D509 Iron deficiency anemia, unspecified: Secondary | ICD-10-CM | POA: Diagnosis present

## 2022-05-29 HISTORY — DX: Encephalopathy, unspecified: G93.40

## 2022-05-29 LAB — GLUCOSE, CAPILLARY
Glucose-Capillary: 199 mg/dL — ABNORMAL HIGH (ref 70–99)
Glucose-Capillary: 155 mg/dL — ABNORMAL HIGH (ref 70–99)
Glucose-Capillary: 92 mg/dL (ref 70–99)
Glucose-Capillary: 109 mg/dL — ABNORMAL HIGH (ref 70–99)

## 2022-05-29 LAB — URINALYSIS, COMPLETE (UACMP) WITH MICROSCOPIC
Bilirubin Urine: NEGATIVE
Glucose, UA: NEGATIVE mg/dL
Hgb urine dipstick: NEGATIVE
Ketones, ur: 5 mg/dL — AB
Nitrite: NEGATIVE
Protein, ur: 30 mg/dL — AB
Specific Gravity, Urine: 1.012 (ref 1.005–1.030)
WBC, UA: >50 WBC/hpf (ref 0–5)
pH: 5 (ref 5.0–8.0)

## 2022-05-29 LAB — TSH: TSH: 1.976 u[IU]/mL (ref 0.350–4.500)

## 2022-05-29 LAB — BASIC METABOLIC PANEL
Anion gap: 17 — ABNORMAL HIGH (ref 5–15)
BUN: 112 mg/dL — ABNORMAL HIGH (ref 6–20)
CO2: 23 mmol/L (ref 22–32)
Calcium: 9.2 mg/dL (ref 8.9–10.3)
Chloride: 91 mmol/L — ABNORMAL LOW (ref 98–111)
Creatinine, Ser: 4.44 mg/dL — ABNORMAL HIGH (ref 0.44–1.00)
GFR, Estimated: 11 mL/min — ABNORMAL LOW (ref >60)
Glucose, Bld: 215 mg/dL — ABNORMAL HIGH (ref 70–99)
Potassium: 4.3 mmol/L (ref 3.5–5.1)
Sodium: 131 mmol/L — ABNORMAL LOW (ref 135–145)

## 2022-05-29 LAB — CBC
HCT: 21.5 % — ABNORMAL LOW (ref 36.0–46.0)
Hemoglobin: 6.7 g/dL — CL (ref 12.0–15.0)
MCH: 26.9 pg (ref 26.0–34.0)
MCHC: 31.2 g/dL (ref 30.0–36.0)
MCV: 86.3 fL (ref 80.0–100.0)
Platelets: 576 10*3/uL — ABNORMAL HIGH (ref 150–400)
RBC: 2.49 MIL/uL — ABNORMAL LOW (ref 3.87–5.11)
RDW: 19.6 % — ABNORMAL HIGH (ref 11.5–15.5)
WBC: 16.7 10*3/uL — ABNORMAL HIGH (ref 4.0–10.5)
nRBC: 0 % (ref 0.0–0.2)

## 2022-05-29 LAB — HEMOGLOBIN AND HEMATOCRIT, BLOOD
HCT: 26 % — ABNORMAL LOW (ref 36.0–46.0)
Hemoglobin: 8 g/dL — ABNORMAL LOW (ref 12.0–15.0)

## 2022-05-29 LAB — TECHNOLOGIST SMEAR REVIEW: Plt Morphology: NORMAL

## 2022-05-29 LAB — IRON AND TIBC
Iron: 32 ug/dL (ref 28–170)
Saturation Ratios: 13 % (ref 10.4–31.8)
TIBC: 249 ug/dL — ABNORMAL LOW (ref 250–450)
UIBC: 217 ug/dL

## 2022-05-29 LAB — MAGNESIUM: Magnesium: 2.2 mg/dL (ref 1.7–2.4)

## 2022-05-29 LAB — RETICULOCYTES
Immature Retic Fract: 28.9 % — ABNORMAL HIGH (ref 2.3–15.9)
RBC.: 2.49 MIL/uL — ABNORMAL LOW (ref 3.87–5.11)
Retic Count, Absolute: 67.5 10*3/uL (ref 19.0–186.0)
Retic Ct Pct: 2.7 % (ref 0.4–3.1)

## 2022-05-29 LAB — PROCALCITONIN: Procalcitonin: 0.28 ng/mL

## 2022-05-29 LAB — SODIUM, URINE, RANDOM: Sodium, Ur: 28 mmol/L

## 2022-05-29 LAB — FOLATE: Folate: 12.4 ng/mL (ref >5.9)

## 2022-05-29 LAB — PREPARE RBC (CROSSMATCH)

## 2022-05-29 LAB — VITAMIN B12: Vitamin B-12: >7500 pg/mL — ABNORMAL HIGH (ref 180–914)

## 2022-05-29 LAB — C-REACTIVE PROTEIN: CRP: 19.6 mg/dL — ABNORMAL HIGH (ref <1.0)

## 2022-05-29 LAB — MRSA NEXT GEN BY PCR, NASAL: MRSA by PCR Next Gen: NOT DETECTED

## 2022-05-29 LAB — PHOSPHORUS: Phosphorus: 5.2 mg/dL — ABNORMAL HIGH (ref 2.5–4.6)

## 2022-05-29 LAB — FERRITIN: Ferritin: 1788 ng/mL — ABNORMAL HIGH (ref 11–307)

## 2022-05-29 LAB — CREATININE, URINE, RANDOM: Creatinine, Urine: 137 mg/dL

## 2022-05-29 LAB — URIC ACID: Uric Acid, Serum: 16.5 mg/dL — ABNORMAL HIGH (ref 2.5–7.1)

## 2022-05-29 MED ORDER — DULOXETINE HCL 20 MG PO CPEP
40.0000 mg | ORAL_CAPSULE | Freq: Two times a day (BID) | ORAL | Status: DC
  Administered 2022-05-29 – 2022-06-04 (×13): 40 mg via ORAL
  Filled 2022-05-29 (×14): qty 2

## 2022-05-29 MED ORDER — FENTANYL CITRATE PF 50 MCG/ML IJ SOSY
25.0000 ug | PREFILLED_SYRINGE | INTRAMUSCULAR | Status: DC | PRN
  Administered 2022-05-31 – 2022-06-04 (×10): 25 ug via INTRAVENOUS
  Filled 2022-05-29 (×10): qty 1

## 2022-05-29 MED ORDER — INSULIN ASPART 100 UNIT/ML IJ SOLN
0.0000 [IU] | Freq: Three times a day (TID) | INTRAMUSCULAR | Status: DC
  Administered 2022-05-29 – 2022-05-30 (×3): 2 [IU] via SUBCUTANEOUS
  Administered 2022-05-30: 20 [IU] via SUBCUTANEOUS
  Administered 2022-05-30: 2 [IU] via SUBCUTANEOUS
  Administered 2022-05-31: 5 [IU] via SUBCUTANEOUS
  Administered 2022-05-31: 3 [IU] via SUBCUTANEOUS
  Administered 2022-05-31: 5 [IU] via SUBCUTANEOUS
  Administered 2022-06-01 (×3): 3 [IU] via SUBCUTANEOUS
  Administered 2022-06-02 (×2): 2 [IU] via SUBCUTANEOUS
  Administered 2022-06-02: 7 [IU] via SUBCUTANEOUS
  Administered 2022-06-03: 3 [IU] via SUBCUTANEOUS
  Administered 2022-06-03: 5 [IU] via SUBCUTANEOUS
  Administered 2022-06-04: 2 [IU] via SUBCUTANEOUS
  Administered 2022-06-04: 5 [IU] via SUBCUTANEOUS

## 2022-05-29 MED ORDER — INSULIN ASPART 100 UNIT/ML IJ SOLN
6.0000 [IU] | Freq: Three times a day (TID) | INTRAMUSCULAR | Status: DC
  Administered 2022-05-29: 6 [IU] via SUBCUTANEOUS

## 2022-05-29 MED ORDER — HALOPERIDOL LACTATE 5 MG/ML IJ SOLN
5.0000 mg | Freq: Once | INTRAMUSCULAR | Status: AC | PRN
  Administered 2022-05-29: 5 mg via INTRAVENOUS
  Filled 2022-05-29: qty 1

## 2022-05-29 MED ORDER — METOPROLOL SUCCINATE ER 25 MG PO TB24
50.0000 mg | ORAL_TABLET | Freq: Every day | ORAL | Status: DC
  Administered 2022-05-30: 50 mg via ORAL
  Filled 2022-05-29 (×2): qty 2

## 2022-05-29 MED ORDER — INSULIN ASPART 100 UNIT/ML IJ SOLN
0.0000 [IU] | Freq: Every day | INTRAMUSCULAR | Status: DC
  Administered 2022-05-30: 5 [IU] via SUBCUTANEOUS
  Administered 2022-05-31: 2 [IU] via SUBCUTANEOUS
  Administered 2022-06-02: 3 [IU] via SUBCUTANEOUS
  Administered 2022-06-03: 2 [IU] via SUBCUTANEOUS

## 2022-05-29 MED ORDER — INSULIN GLARGINE-YFGN 100 UNIT/ML ~~LOC~~ SOLN
15.0000 [IU] | Freq: Every day | SUBCUTANEOUS | Status: DC
  Administered 2022-05-30: 15 [IU] via SUBCUTANEOUS
  Filled 2022-05-29 (×2): qty 0.15

## 2022-05-29 MED ORDER — ACETAMINOPHEN 325 MG PO TABS
650.0000 mg | ORAL_TABLET | Freq: Four times a day (QID) | ORAL | Status: DC | PRN
  Administered 2022-05-31: 650 mg via ORAL
  Filled 2022-05-29: qty 2

## 2022-05-29 MED ORDER — SODIUM CHLORIDE 0.9% IV SOLUTION: Freq: Once | INTRAVENOUS | Status: DC

## 2022-05-29 MED ORDER — ACETAMINOPHEN 650 MG RE SUPP: 650.0000 mg | Freq: Four times a day (QID) | RECTAL | Status: DC | PRN

## 2022-05-29 MED ORDER — NALOXONE HCL 0.4 MG/ML IJ SOLN: 0.4000 mg | INTRAMUSCULAR | Status: DC | PRN

## 2022-05-29 MED ORDER — ONDANSETRON HCL 4 MG/2ML IJ SOLN
4.0000 mg | Freq: Four times a day (QID) | INTRAMUSCULAR | Status: DC | PRN
  Administered 2022-05-31 – 2022-06-04 (×3): 4 mg via INTRAVENOUS
  Filled 2022-05-29 (×3): qty 2

## 2022-05-29 MED ORDER — ALBUTEROL SULFATE (2.5 MG/3ML) 0.083% IN NEBU: 2.5000 mg | INHALATION_SOLUTION | Freq: Four times a day (QID) | RESPIRATORY_TRACT | Status: DC | PRN

## 2022-05-29 MED ORDER — APIXABAN 5 MG PO TABS
5.0000 mg | ORAL_TABLET | Freq: Two times a day (BID) | ORAL | Status: DC
  Administered 2022-05-29 – 2022-06-04 (×13): 5 mg via ORAL
  Filled 2022-05-29 (×13): qty 1

## 2022-05-29 MED ORDER — PANTOPRAZOLE SODIUM 40 MG PO TBEC
40.0000 mg | DELAYED_RELEASE_TABLET | Freq: Two times a day (BID) | ORAL | Status: DC
  Administered 2022-05-29 – 2022-06-04 (×13): 40 mg via ORAL
  Filled 2022-05-29 (×13): qty 1

## 2022-05-29 MED ORDER — GUAIFENESIN 100 MG/5ML PO LIQD: 5.0000 mL | ORAL | Status: DC | PRN

## 2022-05-29 MED ORDER — MELATONIN 5 MG PO TABS
5.0000 mg | ORAL_TABLET | Freq: Every evening | ORAL | Status: DC | PRN
  Filled 2022-05-29 (×2): qty 1

## 2022-05-29 MED ORDER — SODIUM BICARBONATE 650 MG PO TABS
650.0000 mg | ORAL_TABLET | Freq: Two times a day (BID) | ORAL | Status: DC
  Administered 2022-05-29 – 2022-06-04 (×13): 650 mg via ORAL
  Filled 2022-05-29 (×13): qty 1

## 2022-05-29 MED ORDER — ONDANSETRON HCL 4 MG PO TABS: 4.0000 mg | ORAL_TABLET | Freq: Four times a day (QID) | ORAL | Status: DC | PRN

## 2022-05-29 MED ORDER — SODIUM CHLORIDE 0.9 % IV SOLN: INTRAVENOUS | Status: DC

## 2022-05-29 MED ORDER — ATORVASTATIN CALCIUM 10 MG PO TABS
10.0000 mg | ORAL_TABLET | Freq: Every day | ORAL | Status: DC
  Administered 2022-05-29 – 2022-06-04 (×7): 10 mg via ORAL
  Filled 2022-05-29 (×7): qty 1

## 2022-05-29 MED ORDER — ORAL CARE MOUTH RINSE: 15.0000 mL | OROMUCOSAL | Status: DC | PRN

## 2022-05-29 MED ORDER — HYDROMORPHONE HCL 2 MG PO TABS
1.0000 mg | ORAL_TABLET | Freq: Three times a day (TID) | ORAL | Status: DC | PRN
  Administered 2022-05-29: 2 mg via ORAL
  Filled 2022-05-29: qty 1

## 2022-05-29 MED ORDER — HYDROCODONE-ACETAMINOPHEN 5-325 MG PO TABS
1.0000 | ORAL_TABLET | Freq: Four times a day (QID) | ORAL | Status: DC | PRN
  Filled 2022-05-29: qty 1

## 2022-05-29 NOTE — ED Provider Notes (Signed)
Clinical Course as of 05/29/22 0449  Fri May 28, 2022  1858 Hemoglobin(!): 7.2 Baseline of 7.5 [RP]  1858 WBC(!): 17.0 Also 17 two days ago [RP]  Sat May 29, 2022  0017 Cherlynn Polo (brother) contacted. Says she is typically AAOx3. Went to rehab for physical therapy but wasn't confused upon dc from hospital.  [RP]  0107 Signed out to Dr Nechama Guard.  [RP]  [phone] F with CKD, carcinoid tumor, previous CVA no residual deficits, pAfib on Eliquis p/w AMS. Worsening Cr today 4.59 from baseline 3.4 Supect hyperuremia 113.  Ammonia unremarkable.  Follow up head CT and admit.  [VB]  0110 Signed out to Dr Nechama Guard.  [RP]  [phone] Patient has been very difficult to interact with.  She is continuously refusing in and out cath however she does not seem to understand the reasoning for why we want to obtain it.  She has been having waxing and waning attention.  Suspect some delirium.  Could be opioid use, could be worsening uremia.  CT head was unremarkable.  Discussed with Dr. Myna Hidalgo for at least continued observation. [VB]    Clinical Course User Index [RP] Fransico Meadow, MD [VB] Deborah Congo, MD      Deborah Congo, MD 05/29/22 [phone]

## 2022-05-29 NOTE — ED Notes (Signed)
Patient transported to CT 

## 2022-05-29 NOTE — ED Notes (Signed)
RN notified provider for an order for cough medicine

## 2022-05-29 NOTE — Progress Notes (Signed)
EEG complete - results pending 

## 2022-05-29 NOTE — Progress Notes (Signed)
Pt just left for [place]. Will try back a schedule permits.

## 2022-05-29 NOTE — ED Notes (Signed)
Attempted to in and out cath however patient urinated prior and urine was accidentally discarded. There was no output in the cath. Dr. [name] notified.

## 2022-05-29 NOTE — ED Notes (Signed)
Primary and secondary RN tried to collect urine through I/O. Pt stated she doesn't understand the reason for collecting urine when we are aware she is sick. RN explained pt is at [facility] and the urine is needed to r/o suspected infection.   Pt states she already had an I/O done. RN explained earlier pt didn't have any urine collected and a bladder scan was completed that confirmed she has urine in her bladder that can be collected.   The primary RN and secondary RN explained to the pt the procedure and process. When attempting to lay pt down, she stated she cannot lay flat d/t not being able to breathe. RN placed a nasal canula on pt and encouraged deep breathing and explained the I/O should not take any longer than 10 minutes. RN attempted to lay pt flat again and pt stated her feelings are not being considered. Pt states she feels like too many people her telling her different things. RN notified MD to come to room to speak with pt.   Pt seemed was agitated when the provider arrived d/t is being a different person. The RN explained that the shift has changed and introduced the staff in the room. The pt stated she wanted to speak to brother before getting I/O. EDP stated that we can wait at a later time to collect.

## 2022-05-29 NOTE — Plan of Care (Signed)
  Problem: Education: Goal: Ability to describe self-care measures that may prevent or decrease complications (Diabetes Survival Skills Education) will improve Outcome: Not Met (add Reason) Goal: Individualized Educational Video(s) Outcome: Not Met (add Reason)   Problem: Coping: Goal: Ability to adjust to condition or change in health will improve Outcome: Not Met (add Reason)   Problem: Fluid Volume: Goal: Ability to maintain a balanced intake and output will improve Outcome: Not Met (add Reason)   Problem: Health Behavior/Discharge Planning: Goal: Ability to identify and utilize available resources and services will improve Outcome: Not Met (add Reason) Goal: Ability to manage health-related needs will improve Outcome: Not Met (add Reason)   Problem: Metabolic: Goal: Ability to maintain appropriate glucose levels will improve Outcome: Not Met (add Reason)   Problem: Nutritional: Goal: Maintenance of adequate nutrition will improve Outcome: Not Met (add Reason) Goal: Progress toward achieving an optimal weight will improve Outcome: Not Met (add Reason)   Problem: Skin Integrity: Goal: Risk for impaired skin integrity will decrease Outcome: Not Met (add Reason)   Problem: Tissue Perfusion: Goal: Adequacy of tissue perfusion will improve Outcome: Not Met (add Reason)   Problem: Education: Goal: Knowledge of General Education information will improve Description: Including pain rating scale, medication(s)/side effects and non-pharmacologic comfort measures Outcome: Not Met (add Reason)   Problem: Health Behavior/Discharge Planning: Goal: Ability to manage health-related needs will improve Outcome: Not Met (add Reason)   Problem: Clinical Measurements: Goal: Ability to maintain clinical measurements within normal limits will improve Outcome: Not Met (add Reason) Goal: Will remain free from infection Outcome: Not Met (add Reason) Goal: Diagnostic test results will  improve Outcome: Not Met (add Reason) Goal: Respiratory complications will improve Outcome: Not Met (add Reason) Goal: Cardiovascular complication will be avoided Outcome: Not Met (add Reason)   Problem: Activity: Goal: Risk for activity intolerance will decrease Outcome: Not Met (add Reason)   Problem: Nutrition: Goal: Adequate nutrition will be maintained Outcome: Not Met (add Reason)   Problem: Coping: Goal: Level of anxiety will decrease Outcome: Not Met (add Reason)   Problem: Elimination: Goal: Will not experience complications related to bowel motility Outcome: Not Met (add Reason) Goal: Will not experience complications related to urinary retention Outcome: Not Met (add Reason)   Problem: Pain Managment: Goal: General experience of comfort will improve Outcome: Not Met (add Reason)   Problem: Safety: Goal: Ability to remain free from injury will improve Outcome: Not Met (add Reason)   Problem: Skin Integrity: Goal: Risk for impaired skin integrity will decrease Outcome: Not Met (add Reason)

## 2022-05-29 NOTE — H&P (Signed)
History and Physical    Deborah [name] [id number] DOB: June 04, 1965 DOA: 05/28/2022  PCP: Cena Benton, MD   Patient coming from: SNF   Chief Complaint: AMS   HPI: Deborah [name] is a 57 y.o. female with medical history significant for insulin-dependent diabetes mellitus, hypertension, PAF on Eliquis, CKD stage IV, iron deficiency anemia, chronic pain, duodenal carcinoid tumor resected in 2021, and recent protracted admission for HHS which was complicated by AKI, transfusion dependent anemia, and difficult pain control, now presenting with altered mental status.  Patient was discharged to an SNF on 05/27/2022, was noted by SNF personnel to be altered all throughout the day, and she was eventually sent to the ED.  Patient has no acute complaints in the emergency department and does not understand why she was sent here.  She denies chest pain, nausea, vomiting, or diarrhea.  She denies fever or chills.  And she denies dysuria, hematuria, or flank pain.  ED Course: Upon arrival to the ED, patient is found to be afebrile and saturating well on room air with stable blood pressure.  EKG demonstrates sinus rhythm.  Head CT is negative for acute intracranial abnormality.  Labs are most notable for sodium 128, BUN 113, creatinine 4.59, WBC 17,000, hemoglobin 7.2, platelets 602,000, normal ammonia, and undetectable ethanol.  Patient was given a dose of fentanyl in the ED.  Review of Systems:  All other systems reviewed and apart from HPI, are negative.  Past Medical History:  Diagnosis Date   Acute back pain with sciatica, left    Acute back pain with sciatica, right    AKI (acute kidney injury) ([place])    Anemia, unspecified    Cancer (HCC)    Carcinoid tumor of duodenum    Chest pain with normal coronary angiography 2019   Chronic kidney disease, stage 3b (HCC)    Chronic pain    Chronic systolic CHF (congestive heart failure) ([place])    Diabetes mellitus    DKA  (diabetic ketoacidosis) ([place])    Drug-seeking behavior    21 hospitalizations and 14 CT a/p in 2 years for N/V and abdominal pain, demanding only IV dilaudid   Elevated troponin    chronic   Esophageal reflux    Fibromyalgia    Gastric ulcer    Gastroparesis    Gout    Hyperlipidemia    Hypertension    Hypokalemia    Hypomagnesemia    Lumbosacral stenosis    LVH (left ventricular hypertrophy)    Morbid obesity (HCC)    NICM (nonischemic cardiomyopathy) (HCC)    PAF (paroxysmal atrial fibrillation) ([place])    Stroke ([place]) 02/2011   Thrombocytosis    Vitamin B12 deficiency anemia     Past Surgical History:  Procedure Laterality Date   BIOPSY  07/27/2019   Procedure: BIOPSY;  Surgeon: Clarene Essex, MD;  Location: [facility];  Service: Endoscopy;;   BIOPSY  07/30/2019   Procedure: BIOPSY;  Surgeon: Otis Brace, MD;  Location: [facility];  Service: Gastroenterology;;   CATARACT EXTRACTION  01/2014   CHOLECYSTECTOMY     COLONOSCOPY WITH PROPOFOL N/A 07/30/2019   Procedure: COLONOSCOPY WITH PROPOFOL;  Surgeon: Otis Brace, MD;  Location: [facility];  Service: Gastroenterology;  Laterality: N/A;   ESOPHAGOGASTRODUODENOSCOPY N/A 07/27/2019   Procedure: ESOPHAGOGASTRODUODENOSCOPY (EGD);  Surgeon: Clarene Essex, MD;  Location: [facility];  Service: Endoscopy;  Laterality: N/A;   ESOPHAGOGASTRODUODENOSCOPY N/A 07/26/2020   Procedure: ESOPHAGOGASTRODUODENOSCOPY (EGD);  Surgeon: Arta Silence, MD;  Location:  [facility];  Service: Endoscopy;  Laterality: N/A;   ESOPHAGOGASTRODUODENOSCOPY (EGD) WITH PROPOFOL N/A 08/02/2019   Procedure: ESOPHAGOGASTRODUODENOSCOPY (EGD) WITH PROPOFOL;  Surgeon: Otis Brace, MD;  Location: [facility];  Service: Gastroenterology;  Laterality: N/A;   HEMOSTASIS CLIP PLACEMENT  08/02/2019   Procedure: HEMOSTASIS CLIP PLACEMENT;  Surgeon: Otis Brace, MD;  Location: [facility];  Service: Gastroenterology;;   POLYPECTOMY  07/30/2019    Procedure: POLYPECTOMY;  Surgeon: Otis Brace, MD;  Location: [facility];  Service: Gastroenterology;;   POLYPECTOMY  08/02/2019   Procedure: POLYPECTOMY;  Surgeon: Otis Brace, MD;  Location: [facility];  Service: Gastroenterology;;    Social History:   reports that she has never smoked. She has never used smokeless tobacco. She reports that she does not drink alcohol and does not use drugs.  Allergies  Allergen Reactions   Diazepam [name]   Gabapentin [name] and Swelling    Other reaction(s): Unknown   Iodinated Contrast Media Anaphylaxis    11/29/17 Cardiac arrest 1 min after IV contrast, possible allergy vs vasovagal episode Iopamidol  Anaphylaxis  High 11/28/2017  Patient had seizure like activity and then code post 100 cc of isovue 300     Isovue [Iopamidol] Anaphylaxis    11/28/17 Patient had seizure like activity and then 1 min code after 100 cc of isovue 300. Possible contrast allergy vs vasovagal episode   Lisinopril Anaphylaxis    Tongue and mouth swelling   Metoclopramide Other (See Comments)    Tardive dyskinesia  Also known as Reglan    Nsaids Anaphylaxis and Other (See Comments)    ULCER   Penicillins Palpitations    Has patient had a PCN reaction causing immediate rash, facial/tongue/throat swelling, SOB or lightheadedness with hypotension: Yes, heart races Has patient had a PCN reaction causing severe rash involving mucus membranes or skin necrosis: No Has patient had a PCN reaction that required hospitalization: Yes  Has patient had a PCN reaction occurring within the last 10 years: No    Tolmetin Nausea Only and Other (See Comments)    ULCER   Dicyclomine Other (See Comments)    Chest pain   Acetaminophen Nausea Only and Other (See Comments)    Irritates stomach ulcer; Abdominal pain   Cyclobenzaprine Palpitations   Oxycodone Palpitations   Rifamycins Palpitations   Tramadol Nausea And Vomiting    Family History   Problem Relation Age of Onset   Diabetes Mother    Diabetes Father    Heart disease Father    Diabetes Sister    Congestive Heart Failure Sister 47   Diabetes Brother      Prior to Admission medications   Medication Sig Start Date End Date Taking? Authorizing Provider  albuterol (PROVENTIL) (2.5 MG/3ML) 0.083% nebulizer solution Take 3 mLs (2.5 mg total) by nebulization every 6 (six) hours as needed for wheezing or shortness of breath. 04/06/19   Scot Jun, NP  apixaban (ELIQUIS) 5 MG TABS tablet Take 5 mg by mouth 2 (two) times daily. 12/03/21   [provider]  atorvastatin (LIPITOR) 10 MG tablet Take 10 mg by mouth daily. 12/03/21   [provider]  cetirizine (ZYRTEC) 10 MG tablet TAKE 1 TABLET (10 MG TOTAL) BY MOUTH DAILY (AM) Patient taking differently: Take 10 mg by mouth daily. 06/30/21   Raulkar, Clide Deutscher, MD  diclofenac Sodium (VOLTAREN) 1 % GEL Apply 4 g topically 4 (four) times daily. 05/27/22   Hosie Poisson, MD  DULoxetine HCl  40 MG CPEP Take 40 mg by mouth in the morning and at bedtime. 03/22/22   Shary Key, DO  EASY COMFORT PEN NEEDLES 31G X 5 MM MISC USE 3 TIMES A DAY FOR INSULIN ADMINISTRATION 11/14/19   Meccariello, Bernita Raisin, MD  famotidine (PEPCID) 20 MG tablet Take 1 tablet (20 mg total) by mouth daily. 03/22/22 07/20/22  Shary Key, DO  ferrous sulfate 325 (65 FE) MG tablet Take 1 tablet (325 mg total) by mouth 2 (two) times daily with a meal. 05/27/22   Hosie Poisson, MD  fluticasone (FLONASE) 50 MCG/ACT nasal spray Place 2 sprays into both nostrils daily as needed for allergies or rhinitis. Patient taking differently: Place 1 spray into both nostrils daily as needed for allergies. 12/19/18   Rai, Vernelle Emerald, MD  folic acid (FOLVITE) 1 MG tablet Take 1 mg by mouth daily. 04/07/21   [provider]  HYDROmorphone (DILAUDID) 2 MG tablet Take 0.5-1 tablets (1-2 mg total) by mouth every 8 (eight) hours as needed for up to 3 days  for severe pain. 05/27/22 05/30/22  Hosie Poisson, MD  insulin aspart (NOVOLOG) 100 UNIT/ML injection CBG 70 - 120: 0 units  CBG 121 - 150: 3 units  CBG 151 - 200: 4 units  CBG 201 - 250: 7 units  CBG 251 - 300: 11 units  CBG 301 - 350: 15 units  CBG 351 - 400: 20 units 05/27/22   Hosie Poisson, MD  insulin aspart (NOVOLOG) 100 UNIT/ML injection Inject 8 Units into the skin 3 (three) times daily with meals. 05/27/22   Hosie Poisson, MD  insulin glargine-yfgn (SEMGLEE) 100 UNIT/ML injection Inject 0.15 mLs (15 Units total) into the skin at bedtime. 05/27/22   Hosie Poisson, MD  magnesium chloride (SLOW-MAG) 64 MG TBEC SR tablet Take 1 tablet by mouth daily. 12/07/21   [provider]  metoprolol succinate (TOPROL-XL) 50 MG 24 hr tablet Take 1 tablet (50 mg total) by mouth daily. Take with or immediately following a meal. 11/24/20 03/18/23  Enzo Bi, MD  Multiple Vitamin (MULTIVITAMIN WITH MINERALS) TABS tablet Take 1 tablet by mouth daily. 05/28/22   Hosie Poisson, MD  ondansetron (ZOFRAN ODT) 4 MG disintegrating tablet Take 1 tablet (4 mg total) by mouth every 8 (eight) hours as needed for nausea or vomiting. 05/30/21   Sherwood Gambler, MD  pantoprazole (PROTONIX) 40 MG tablet TAKE 1 TABLET BY MOUTH 2 (TWO) TIMES DAILY. (AM+BEDTIME) Patient taking differently: Take 40 mg by mouth 2 (two) times daily. 03/11/22   Raulkar, Clide Deutscher, MD  SENNA PLUS 8.6-50 MG tablet Take 1 tablet by mouth daily. Patient not taking: Reported on 05/11/2022 05/19/21   [provider]  sodium bicarbonate 650 MG tablet Take 1 tablet (650 mg total) by mouth 2 (two) times daily. 04/20/21   Jennye Boroughs, MD  torsemide (DEMADEX) 100 MG tablet Take 50 mg by mouth 2 (two) times daily. 12/03/21   [provider]  Vitamin D, Ergocalciferol, (DRISDOL) 1.25 MG (50000 UNIT) CAPS capsule Take 50,000 Units by mouth every Monday. 04/07/21   [provider]    Physical Exam: Vitals:   05/28/22 2300 05/28/22  2345 05/29/22 0130 05/29/22 0200  BP: 120/68 119/66 (!) 120/53 127/64  Pulse:   96 95  Resp: 20 14 17  (!) 21  Temp:      TempSrc:      SpO2:   100% 100%    Constitutional: NAD, no pallor or diaphoresis  Eyes: PERTLA, lids and conjunctivae normal ENMT: Mucous membranes are moist. Posterior pharynx clear of any exudate or lesions.   Neck: supple, no masses  Respiratory: no wheezing, no crackles. No accessory muscle use.  Cardiovascular: S1 & S2 heard, regular rate and rhythm. No extremity edema.  Abdomen: No distension, no tenderness, soft. Bowel sounds active.  Musculoskeletal: no clubbing / cyanosis. No joint deformity upper and lower extremities.   Skin: no significant rashes, lesions, ulcers. Warm, dry, well-perfused. Neurologic: CN 2-12 grossly intact. Moving all extremities. Alert and oriented to person, place, and situation but has had waxing and waning confusion and memory loss.  Psychiatric: Calm and cooperative currently.    Labs and Imaging on Admission: I have personally reviewed following labs and imaging studies  CBC: Recent Labs  Lab 05/23/22 0405 05/24/22 0458 05/25/22 0310 05/26/22 0328 05/28/22 1819 05/28/22 1825  WBC 16.4* 15.7* 17.7* 17.6* 17.0*  --   NEUTROABS  --   --   --   --  13.7*  --   HGB 7.5* 7.3* 7.5* 7.6* 7.2* 8.2*  HCT 24.4* 23.2* 24.3* 25.2* 24.4* 24.0*  MCV 87.1 85.6 86.8 86.9 90.0  --   PLT 662* 620* 657* 705* 602*  --    Basic Metabolic Panel: Recent Labs  Lab 05/23/22 0405 05/24/22 0458 05/25/22 0310 05/26/22 0328 05/28/22 1819 05/28/22 1825  NA 131* 132* 130* 130* 128* 130*  K 4.1 4.2 4.6 4.2 4.8 4.9  CL 96* 97* 94* 94* 93*  --   CO2 23 23 22 23  20*  --   GLUCOSE 198* 138* 241* 228* 193*  --   BUN 88* 90* 93* 96* 113*  --   CREATININE 3.22* 3.08* 3.36* 3.87* 4.59*  --   CALCIUM 9.3 9.2 9.3 9.4 9.2  --   MG 1.9  --  2.0  --   --   --    GFR: CrCl cannot be calculated (Unknown ideal weight.). Liver Function Tests: Recent  Labs  Lab 05/23/22 0405 05/28/22 1819  AST 20 22  ALT 24 17  ALKPHOS 94 91  BILITOT 0.8 0.9  PROT 7.7 8.0  ALBUMIN 2.0* 2.4*   No results for input(s): "LIPASE", "AMYLASE" in the last 168 hours. Recent Labs  Lab 05/28/22 1819  AMMONIA 19   Coagulation Profile: No results for input(s): "INR", "PROTIME" in the last 168 hours. Cardiac Enzymes: No results for input(s): "CKTOTAL", "CKMB", "CKMBINDEX", "TROPONINI" in the last 168 hours. BNP (last 3 results) No results for input(s): "PROBNP" in the last 8760 hours. HbA1C: No results for input(s): "HGBA1C" in the last 72 hours. CBG: Recent Labs  Lab 05/26/22 2156 05/27/22 0742 05/27/22 1205 05/28/22 1744 05/28/22 1837  GLUCAP 187* 205* 172* 173* 174*   Lipid Profile: No results for input(s): "CHOL", "HDL", "LDLCALC", "TRIG", "CHOLHDL", "LDLDIRECT" in the last 72 hours. Thyroid Function Tests: No results for input(s): "TSH", "T4TOTAL", "FREET4", "T3FREE", "THYROIDAB" in the last 72 hours. Anemia Panel: No results for input(s): "VITAMINB12", "FOLATE", "FERRITIN", "TIBC", "IRON", "RETICCTPCT" in the last 72 hours. Urine analysis:    Component Value Date/Time   COLORURINE YELLOW 05/11/2022 1441   APPEARANCEUR HAZY (A) 05/11/2022 1441   LABSPEC 1.012 05/11/2022 1441   PHURINE 5.0 05/11/2022 1441   GLUCOSEU 150 (A) 05/11/2022 1441   HGBUR SMALL (A) 05/11/2022 1441   BILIRUBINUR NEGATIVE 05/11/2022 1441   KETONESUR NEGATIVE 05/11/2022 1441   PROTEINUR 100 (A) 05/11/2022 1441   UROBILINOGEN 0.2 10/02/2013 2108   NITRITE NEGATIVE  05/11/2022 1441   LEUKOCYTESUR NEGATIVE 05/11/2022 1441   Sepsis Labs: @LABRCNTIP (procalcitonin:4,lacticidven:4) )No results found for this or any previous visit (from the past 240 hour(s)).   Radiological Exams on Admission: CT HEAD WO CONTRAST  Result Date: 05/29/2022 CLINICAL DATA:  Altered mental status EXAM: CT HEAD WITHOUT CONTRAST TECHNIQUE: Contiguous axial images were obtained from the  base of the skull through the vertex without intravenous contrast. RADIATION DOSE REDUCTION: This exam was performed according to the departmental dose-optimization program which includes automated exposure control, adjustment of the mA and/or kV according to patient size and/or use of iterative reconstruction technique. COMPARISON:  05/10/2022 FINDINGS: Brain: No evidence of acute infarction, hemorrhage, hydrocephalus, extra-axial collection or mass lesion/mass effect. Mild cortical atrophy suggested. Vascular: Mild intracranial arterial calcifications. Skull: Normal. Negative for fracture or focal lesion. Sinuses/Orbits: No acute finding. Other: Congenital nonunion of the posterior arch of C1. IMPRESSION: No acute intracranial abnormalities. No change since previous study. Electronically Signed   By: Lucienne Capers M.D.   On: 05/29/2022 02:36    EKG: Independently reviewed. Sinus rhythm.    Assessment/Plan   1. AKI superimposed on CKD IV  - BUN is 113 and SCr 4.59 on admission, up from 96 & 3.87 two days earlier  - Check urinalysis with microscopy, hold torsemide, check FEUrea, start IVF hydration, renally-dose medications, repeat chem panel in am    2. Acute encephalopathy  - Patient has exhibited waxing and waning confusion, memory loss, and inattention in the ED    - Nursing note from 3/18 documents baseline forgetfulness and confusion though the patient's brother states that she is normally A&O x4  - There is no acute findings on head CT, ammonia is normal, pCO2 low, and EtOH undetectable in ED  - TSH and B12 were normal earlier this month  - Delirium precautions, treat AKI/uremia as above    3. Lumbar spinal stenosis  - With b/l LE weakness, wheelchair-bound for months  - MRI during recent admission reviewed by Dr. Ronnald Ramp of neurosurgery who recommended PT and outpatient follow-up  - Continue PT and outpatient neurosurgery follow-up as planned   4. Anemia  - Hgb 7.2 on admission, down  from 7.6 two days earlier  - She required 2 units RBC during recent admission earlier this month, had negative FOBT, anemia panel consistent with IDA, and was evaluated by GI who did not think this was gastrointestinal  - Type and screen, transfuse as needed    5. PAF  - Continue Eliquis and metoprolol    6. Leukocytosis; thrombocytosis  - Appears chronic and stable, no apparent infection, recent procalcitonin levels were low, and she has been advised to follow-up with hematology    7. Insulin-dependent DM  - A1c was 10.1% in January 2024  - Check CBGs and continue long- and short-acting insulin    8. Hyponatremia  - Serum sodium 128 on admission  - She appears hypovolemic and has AKI on CKD  - Check urine sodium, start isotonic IVF hydration, monitor    DVT prophylaxis: Eliquis  Code Status: Full  Level of Care: Level of care: Telemetry Medical Family Communication: None present  Disposition Plan:  Patient is from: SNF  Anticipated d/c is to: SNF  Anticipated d/c date is: 3/24 or 05/31/22  Patient currently: Pending improved/stable renal function, stable H&H  Consults called: None  Admission status: Observation     Vianne Bulls, MD [facility]  05/29/2022, 5:52 AM

## 2022-05-29 NOTE — Evaluation (Signed)
Physical Therapy Evaluation Patient Details Name: Deborah [name] MRN: [id number] DOB: 1965/05/03 Today's Date: 05/29/2022  History of Present Illness  57 y/o F admitted to [facility] on 3/22 for AMS. Head CT negative. Pt with recent admission 3/4-3/21 where she was discharged to a SNF. PMHx: DM, CHF, CVA, duodenal carcinoid tumor resected in 2021, AKI, anemia, chronic pain, CKD, PAF on eliquis, lumbar spinal stenosis  Clinical Impression  Pt presents today with impaired cognition and mobility, limited by lethargy and mild pain in BLE. Pt poor historian and requiring increased time and repetition of cueing to participate in therapy, able to partially sit EOB and roll, limited by pt being transported to ultrasound as well as pt declining any assistance for mobility and easily agitated with cueing today. Per chart review, pt was recently requiring assist for transfers, recently discharged to a SNF. Acute PT will follow up with pt as appropriate to progress mobility, recommend return to SNF at discharge as pt requiring full time care and assist with all mobility.        Recommendations for follow up therapy are one component of a multi-disciplinary discharge planning process, led by the attending physician.  Recommendations may be updated based on patient status, additional functional criteria and insurance authorization.  Follow Up Recommendations Skilled nursing-short term rehab (<3 hours/day) Can patient physically be transported by private vehicle: No    Assistance Recommended at Discharge Frequent or constant Supervision/Assistance  Patient can return home with the following  A lot of help with walking and/or transfers;Assistance with cooking/housework;Direct supervision/assist for medications management;Direct supervision/assist for financial management;Assist for transportation;Help with stairs or ramp for entrance    Equipment Recommendations Other (comment) (defer to next level)   Recommendations for Other Services       Functional Status Assessment Patient has had a recent decline in their functional status and demonstrates the ability to make significant improvements in function in a reasonable and predictable amount of time.     Precautions / Restrictions Precautions Precautions: Fall Restrictions Weight Bearing Restrictions: No      Mobility  Bed Mobility Overal bed mobility: Needs Assistance Bed Mobility: Rolling Rolling: Min guard   Supine to sit: Min guard Sit to supine: Min guard   General bed mobility comments: pt with use of side rails and very increased time to perform mobility, with cueing throughout for attention to task. Able to get BLE to EOB with increased time and partial sit performed with pt propped on elbows but unable to fully come into sitting even with encouragement. Returning fully supine with increased time and cueing for technique, rolling to the left with use of side rail    Transfers                   General transfer comment: unable, transport taking pt for ultrasound and unable to fully get EOB    Ambulation/Gait               General Gait Details: utilizes wheelchair most recently  Science writer    Modified Rankin (Stroke Patients Only)       Balance Overall balance assessment: Needs assistance Sitting-balance support: Bilateral upper extremity supported, Feet unsupported Sitting balance-Leahy Scale: Poor Sitting balance - Comments: not fully EOB but propped on BUE for balance, limited by lethargy  Pertinent Vitals/Pain Pain Assessment Pain Assessment: Faces Faces Pain Scale: Hurts little more Pain Location: BLE with movement Pain Descriptors / Indicators: Discomfort, Grimacing, Guarding Pain Intervention(s): Monitored during session, Limited activity within patient's tolerance    Home Living Family/patient  expects to be discharged to:: Skilled nursing facility Living Arrangements: Alone Available Help at Discharge: Personal care attendant;Available PRN/intermittently Type of Home: Apartment Home Access: Stairs to enter Entrance Stairs-Rails: None Entrance Stairs-Number of Steps: 2   Home Layout: One level Home Equipment: BSC/3in1;Shower seat;Wheelchair - manual Additional Comments: Anticipate discharge back to SNF, as pt only there 1 day, no family to confirm and pt poor historian, most home set up obtained from recent admission. At home pt had PCA 2.5 hours/day    Prior Function Prior Level of Function : Needs assist             Mobility Comments: Per chart review, pt wheelchair bound over the past few months, recently requiring assistance for transfers, at SNF for STR but readmitted back to [place]   Dominant Hand: Right    Extremity/Trunk Assessment   Upper Extremity Assessment Upper Extremity Assessment: Defer to OT evaluation    Lower Extremity Assessment Lower Extremity Assessment: Generalized weakness (limited by pt not wanting to be touched and limited command following, light touch intact, active movement noted but increased time needed to move BLE to EOB. Noted mild pain with movement)       Communication   Communication: Expressive difficulties (often repeating herself or needing increased time to get her thoughts out, limited by lethargy as well)  Cognition Arousal/Alertness: Lethargic Behavior During Therapy: Agitated Overall Cognitive Status: No family/caregiver present to determine baseline cognitive functioning Area of Impairment: Problem solving, Safety/judgement, Following commands, Memory                     Memory: Decreased short-term memory Following Commands: Follows one step commands inconsistently, Follows one step commands with increased time Safety/Judgement: Decreased awareness of deficits   Problem Solving: Slow  processing, Decreased initiation, Difficulty sequencing, Requires verbal cues General Comments: Pt drowsy upon arrival, awakes to sound but intermittently drifts off briefly but denies she is sleeping. Pt easily agitated, disliking any encouragement with mobility. Increased time and repetition for command following, declining any physical help but easily distracted by environment or self, starting sentences but not finishing them. Pt often repeating herself when asked questions, using the answer for the first question throughout.        General Comments General comments (skin integrity, edema, etc.): VSS on room air, increased time for all mobility and redirection, limited by cognition and lethargy today    Exercises     Assessment/Plan    PT Assessment Patient needs continued PT services  PT Problem List Decreased strength;Decreased activity tolerance;Decreased balance;Decreased mobility;Decreased cognition;Decreased safety awareness;Decreased knowledge of precautions       PT Treatment Interventions DME instruction;Functional mobility training;Therapeutic activities;Therapeutic exercise;Balance training;Neuromuscular re-education;Cognitive remediation;Patient/family education;Wheelchair mobility training    PT Goals (Current goals can be found in the Care Plan section)  Acute Rehab PT Goals Patient Stated Goal: none stated today PT Goal Formulation: Patient unable to participate in goal setting Time For Goal Achievement: 06/12/22 Potential to Achieve Goals: Fair    Frequency Min 2X/week     Co-evaluation               AM-PAC PT "6 Clicks" Mobility  Outcome Measure Help needed turning  from your back to your side while in a flat bed without using bedrails?: A Lot Help needed moving from lying on your back to sitting on the side of a flat bed without using bedrails?: A Lot Help needed moving to and from a bed to a chair (including a wheelchair)?: Total Help needed standing  up from a chair using your arms (e.g., wheelchair or bedside chair)?: Total Help needed to walk in hospital room?: Total Help needed climbing 3-5 steps with a railing? : Total 6 Click Score: 8    End of Session   Activity Tolerance: Patient limited by lethargy Patient left: in bed;with call bell/phone within reach;with nursing/sitter in room Nurse Communication: Mobility status PT Visit Diagnosis: Muscle weakness (generalized) (M62.81);Other abnormalities of gait and mobility (R26.89)    Time: [id number] PT Time Calculation (min) (ACUTE ONLY): 24 min   Charges:   PT Evaluation $PT Eval Moderate Complexity: 1 Mod          Charlynne Cousins, PT DPT Acute Rehabilitation Services Office [phone]   Luvenia Heller 05/29/2022, 1:22 PM

## 2022-05-29 NOTE — Procedures (Signed)
Patient Name: GRACIELA TICHY  MRN: [id number]  Epilepsy Attending: Lora Havens  Referring Physician/Provider: Thurnell Lose, MD  Date: 05/29/2022 Duration: 22.35 mins  Patient history: 57yo F with ams. EEG to evaluate for seizure  Level of alertness: Awake  AEDs during EEG study: None  Technical aspects: This EEG study was done with scalp electrodes positioned according to the 10-20 International system of electrode placement. Electrical activity was reviewed with band pass filter of 1-70Hz , sensitivity of 7 uV/mm, display speed of 13mm/sec with a 60Hz  notched filter applied as appropriate. EEG data were recorded continuously and digitally stored.  Video monitoring was available and reviewed as appropriate.  Description: EEG showed continuous generalized 3 to 6 Hz theta-delta slowing. Generalized periodic discharges with triphasic morphology at  1Hz  were also noted. Hyperventilation and photic stimulation were not performed.     ABNORMALITY - Periodic discharges with triphasic morphology, generalized ( GPDs) - Continuous slow, generalized  IMPRESSION: This study is suggestive of moderate diffuse encephalopathy, nonspecific etiology but likely related to toxic-metabolic causes. No seizures or definite epileptiform discharges were seen throughout the recording.  Raina Sole [name]

## 2022-05-29 NOTE — ED Notes (Signed)
Pt requested cough medicine.

## 2022-05-29 NOTE — ED Notes (Signed)
X-ray at bedside

## 2022-05-29 NOTE — Progress Notes (Signed)
TRH night cross cover note:   I was notified by RN that this patient is having trouble sleeping but is also appearing agitated, yelling out.  Attempting to emphasize verbal redirection, but with limited success.  I subsequently placed orders for prn melatonin for sleep, and also a order for a one-time prn dose of Haldol for agitation.  RN conveys that the patient is cycling between multiple distinct personalities and exhibiting diminished oral intake overnight. Prn iv haldol order in place. I ordered uds and placed order for psych consult. Also ordered LR 75 cc/hr x 6 hours.   Update: large green-appearing bowel movement towards end of shift, in absence of gross blood.    Babs Bertin, DO Hospitalist

## 2022-05-29 NOTE — Progress Notes (Deleted)
EEG complete - results pending 

## 2022-05-29 NOTE — Progress Notes (Signed)
PROGRESS NOTE                                                                                                                                                                                                             Patient Demographics:    Deborah [name], is a 57 y.o. female, DOB - 1965-12-09, [id number]  Outpatient Primary MD for the patient is Cena Benton, MD    LOS - 0  Admit date - 05/28/2022    Chief Complaint  Patient presents with   Altered Mental Status       Brief Narrative (HPI from H&P)   57 y.o. female with medical history significant for insulin-dependent diabetes mellitus, hypertension, PAF on Eliquis, CKD stage IV, iron deficiency anemia, chronic pain, duodenal carcinoid tumor resected in 2021, and recent protracted admission for HHS which was complicated by AKI, transfusion dependent anemia, and difficult pain control, now presenting with altered mental status.   Patient was discharged to an SNF on 05/27/2022, was noted by SNF personnel to be altered all throughout the day, and she was eventually sent to the ED.  Patient has no acute complaints in the emergency department and does not understand why she was sent here.  She denies chest pain, nausea, vomiting, or diarrhea.  She denies fever or chills, she denies dysuria, hematuria, or flank pain.   In the ED her workup was suggestive of dehydration, AKI and metabolic encephalopathy, head CT was unremarkable, with ammonia level was stable, blood gas did not suggest any CO2 narcosis, she was had no focal deficits and was kept in the hospital for further treatment.   Subjective:    Deborah [name] today has, No headache, No chest pain, mild suprapubic abdominal pain - No Nausea, No new weakness tingling or numbness, no SOB   Assessment  & Plan :     1.  Acute metabolic encephalopathy due to dehydration along with AKI superimposed on CKD  IV  -Her baseline creatinine is around 3.8, renal function has certainly worsened, she is being hydrated with IV fluids, urine electrolytes ordered, renal ultrasound also ordered, will monitor frequent bladder scans.  Head CT unremarkable, no focal deficits, stable ammonia levels, blood gas does not suggest CO2 retention, chest x-ray and UA stable, continue to monitor closely.  Will also check EEG, if no improvement by  tomorrow then MRI brain.   2. Acute encephalopathy  -See above   3. Lumbar spinal stenosis  - With b/l LE weakness, wheelchair-bound for months  - MRI during recent admission reviewed by Dr. Ronnald Ramp of neurosurgery who recommended PT and outpatient follow-up  - Continue PT and outpatient neurosurgery follow-up as planned    4. Anemia, transfusion dependent.  Negative FOBT recently. - Hgb dropped from 7.2 at the time of admission due to heme dilution, type screen 2 units of packed RBC transfusion, continue PPI, no signs of ongoing bleeding, will check anemia panel along with peripheral smear.   5. PAF  - Continue Eliquis and metoprolol     6. Leukocytosis; thrombocytosis chronic - Appears chronic and stable, no apparent infection, recent procalcitonin levels were low, and she has been advised to follow-up with hematology, most of the changes are chronic, check smear, procalcitonin and CRP.   7.  Hyponatremia  -Hydrate and monitor  8.  Insulin-dependent DM  - A1c was 10.1% in January 2024  - Check CBGs and continue long- and short-acting insulin     CBG (last 3)  Recent Labs    05/28/22 1744 05/28/22 1837 05/29/22 0740  GLUCAP 173* 174* 199*    Lab Results  Component Value Date   HGBA1C 10.1 (H) 03/21/2022        Condition - Extremely Guarded  Family Communication  : Called mother Vicente Males [phone]  on 05/29/2022 at 10:49 AM.  Code Status :  Full  Consults  :  None  PUD Prophylaxis : PPI   Procedures  :     CT head.  Nonacute.    EEG.       Disposition Plan  :    Status is: Observation  DVT Prophylaxis  :     apixaban (ELIQUIS) tablet 5 mg     Lab Results  Component Value Date   PLT 576 (H) 05/29/2022    Diet :  Diet Order             Diet regular Room service appropriate? Yes; Fluid consistency: Thin  Diet effective now                    Inpatient Medications  Scheduled Meds:  apixaban  5 mg Oral BID   atorvastatin  10 mg Oral Daily   DULoxetine  40 mg Oral BID   insulin aspart  0-5 Units Subcutaneous QHS   insulin aspart  0-9 Units Subcutaneous TID WC   insulin aspart  6 Units Subcutaneous TID WC   insulin glargine-yfgn  15 Units Subcutaneous QHS   metoprolol succinate  50 mg Oral Daily   pantoprazole  40 mg Oral BID   sodium bicarbonate  650 mg Oral BID   Continuous Infusions:  sodium chloride 100 mL/hr at 05/29/22 0814   PRN Meds:.acetaminophen **OR** acetaminophen, albuterol, fentaNYL (SUBLIMAZE) injection, guaiFENesin, HYDROcodone-acetaminophen, naLOXone (NARCAN)  injection, ondansetron **OR** ondansetron (ZOFRAN) IV  Antibiotics  :    Anti-infectives (From admission, onward)    None         Objective:   Vitals:   05/29/22 0200 05/29/22 0545 05/29/22 0634 05/29/22 0743  BP: 127/64 127/68 128/67 108/66  Pulse: 95 100 77   Resp: (!) 21   18  Temp:  97.9 F (36.6 C) 98.4 F (36.9 C) 97.6 F (36.4 C)  TempSrc:  Oral Oral Oral  SpO2: 100% 100%    Weight:   132.4 kg  Wt Readings from Last 3 Encounters:  05/29/22 132.4 kg  05/13/22 (!) 139.2 kg  03/20/22 115.2 kg    No intake or output data in the 24 hours ending 05/29/22 1048   Physical Exam  Awake Alert x 2, No new F.N deficits, bizzare affect [place].AT,PERRAL Supple Neck, No JVD,   Symmetrical Chest wall movement, Good air movement bilaterally, CTAB RRR,No Gallops,Rubs or new Murmurs,  +ve B.Sounds, Abd Soft, No tenderness,   No Cyanosis, Clubbing or edema        Data Review:    Recent Labs  Lab  05/24/22 0458 05/25/22 0310 05/26/22 0328 05/28/22 1819 05/28/22 1825 05/29/22 0728  WBC 15.7* 17.7* 17.6* 17.0*  --  16.7*  HGB 7.3* 7.5* 7.6* 7.2* 8.2* 6.7*  HCT 23.2* 24.3* 25.2* 24.4* 24.0* 21.5*  PLT 620* 657* 705* 602*  --  576*  MCV 85.6 86.8 86.9 90.0  --  86.3  MCH 26.9 26.8 26.2 26.6  --  26.9  MCHC 31.5 30.9 30.2 29.5*  --  31.2  RDW 18.9* 18.8* 19.1* 19.8*  --  19.6*  LYMPHSABS  --   --   --  1.8  --   --   MONOABS  --   --   --  1.1*  --   --   EOSABS  --   --   --  0.2  --   --   BASOSABS  --   --   --  0.1  --   --     Recent Labs  Lab 05/23/22 0405 05/24/22 0458 05/25/22 0310 05/26/22 0328 05/28/22 1819 05/28/22 1825 05/29/22 0728  NA 131* 132* 130* 130* 128* 130* 131*  K 4.1 4.2 4.6 4.2 4.8 4.9 4.3  CL 96* 97* 94* 94* 93*  --  91*  CO2 23 23 22 23  20*  --  23  ANIONGAP 12 12 14 13 15   --  17*  GLUCOSE 198* 138* 241* 228* 193*  --  215*  BUN 88* 90* 93* 96* 113*  --  112*  CREATININE 3.22* 3.08* 3.36* 3.87* 4.59*  --  4.44*  AST 20  --   --   --  22  --   --   ALT 24  --   --   --  17  --   --   ALKPHOS 94  --   --   --  91  --   --   BILITOT 0.8  --   --   --  0.9  --   --   ALBUMIN 2.0*  --   --   --  2.4*  --   --   AMMONIA  --   --   --   --  19  --   --   MG 1.9  --  2.0  --   --   --  2.2  CALCIUM 9.3 9.2 9.3 9.4 9.2  --  9.2      Recent Labs  Lab 05/23/22 0405 05/24/22 0458 05/25/22 0310 05/26/22 0328 05/28/22 1819 05/29/22 0728  AMMONIA  --   --   --   --  19  --   MG 1.9  --  2.0  --   --  2.2  CALCIUM 9.3 9.2 9.3 9.4 9.2 9.2    No results for input(s): "CHOL", "HDL", "LDLCALC", "TRIG", "CHOLHDL", "LDLDIRECT" in the last 72 hours.  Lab Results  Component Value Date   HGBA1C 10.1 (  H) 03/21/2022      Micro Results No results found for this or any previous visit (from the past 240 hour(s)).  Radiology Reports DG Chest Port 1 View  Result Date: 05/29/2022 CLINICAL DATA:  57 year old female with shortness of breath. EXAM:  PORTABLE CHEST 1 VIEW COMPARISON:  CT Chest, Abdomen, and Pelvis 05/10/2022, and earlier. FINDINGS: Portable AP upright view at 0553 hours. Lung volumes and mediastinal contours are within normal limits. Visualized tracheal air column is within normal limits. Allowing for portable technique the lungs are clear. No pneumothorax or pleural effusion. Paucity bowel gas in the visible abdomen. No acute osseous abnormality identified. IMPRESSION: Negative portable chest. Electronically Signed   By: Genevie Ann M.D.   On: 05/29/2022 06:16   CT HEAD WO CONTRAST  Result Date: 05/29/2022 CLINICAL DATA:  Altered mental status EXAM: CT HEAD WITHOUT CONTRAST TECHNIQUE: Contiguous axial images were obtained from the base of the skull through the vertex without intravenous contrast. RADIATION DOSE REDUCTION: This exam was performed according to the departmental dose-optimization program which includes automated exposure control, adjustment of the mA and/or kV according to patient size and/or use of iterative reconstruction technique. COMPARISON:  05/10/2022 FINDINGS: Brain: No evidence of acute infarction, hemorrhage, hydrocephalus, extra-axial collection or mass lesion/mass effect. Mild cortical atrophy suggested. Vascular: Mild intracranial arterial calcifications. Skull: Normal. Negative for fracture or focal lesion. Sinuses/Orbits: No acute finding. Other: Congenital nonunion of the posterior arch of C1. IMPRESSION: No acute intracranial abnormalities. No change since previous study. Electronically Signed   By: Lucienne Capers M.D.   On: 05/29/2022 02:36      Signature  -   Lala Lund M.D on 05/29/2022 at 10:48 AM   -  To page go to [email or web address]

## 2022-05-29 NOTE — ED Notes (Addendum)
ED TO INPATIENT HANDOFF REPORT  ED Nurse Name and Phone #: Juel Burrow [phone]  S Name/Age/Gender Deborah [name] 57 y.o. female Room/Bed: 012C/012C  Code Status   Code Status: Prior  Home/SNF/Other Rehab Patient oriented to: self and place Is this baseline? Yes      Chief Complaint Acute renal failure superimposed on stage 4 chronic kidney disease ([place]) [N17.9, N18.4]  Triage Note Pt BIB EMS from facility for AMS, has been sitting on the side of the bed all day, has been confused, staff was not aware as to why the patient was at the facility, pt is AO to self, legs are swollen, small pupils.  CBG 204   Allergies Allergies  Allergen Reactions   Diazepam [name]   Gabapentin [name] and Swelling    Other reaction(s): Unknown   Iodinated Contrast Media Anaphylaxis    11/29/17 Cardiac arrest 1 min after IV contrast, possible allergy vs vasovagal episode Iopamidol  Anaphylaxis  High 11/28/2017  Patient had seizure like activity and then code post 100 cc of isovue 300     Isovue [Iopamidol] Anaphylaxis    11/28/17 Patient had seizure like activity and then 1 min code after 100 cc of isovue 300. Possible contrast allergy vs vasovagal episode   Lisinopril Anaphylaxis    Tongue and mouth swelling   Metoclopramide Other (See Comments)    Tardive dyskinesia  Also known as Reglan    Nsaids Anaphylaxis and Other (See Comments)    ULCER   Penicillins Palpitations    Has patient had a PCN reaction causing immediate rash, facial/tongue/throat swelling, SOB or lightheadedness with hypotension: Yes, heart races Has patient had a PCN reaction causing severe rash involving mucus membranes or skin necrosis: No Has patient had a PCN reaction that required hospitalization: Yes  Has patient had a PCN reaction occurring within the last 10 years: No    Tolmetin Nausea Only and Other (See Comments)    ULCER   Dicyclomine Other (See Comments)    Chest  pain   Acetaminophen Nausea Only and Other (See Comments)    Irritates stomach ulcer; Abdominal pain   Cyclobenzaprine Palpitations   Oxycodone Palpitations   Rifamycins Palpitations   Tramadol Nausea And Vomiting    Level of Care/Admitting Diagnosis ED Disposition     ED Disposition  Admit   Condition  --   [place]: [place] [[id number]]  Level of Care: Telemetry Medical [104]  May place patient in observation at [facility] or [place] if equivalent level of care is available:: No  Covid Evaluation: Asymptomatic - no recent exposure (last 10 days) testing not required  Diagnosis: Acute renal failure superimposed on stage 4 chronic kidney disease [facility]) [id number]  Admitting Physician: Vianne Bulls [id number]  Attending Physician: Vianne Bulls [id number]          B Medical/Surgery History Past Medical History:  Diagnosis Date   Acute back pain with sciatica, left    Acute back pain with sciatica, right    AKI (acute kidney injury) ([place])    Anemia, unspecified    Cancer ([place])    Carcinoid tumor of duodenum    Chest pain with normal coronary angiography 2019   Chronic kidney disease, stage 3b (HCC)    Chronic pain    Chronic systolic CHF (congestive heart failure) ([employer])    Diabetes mellitus    DKA (diabetic ketoacidosis) ([place])    Drug-seeking behavior  21 hospitalizations and 14 CT a/p in 2 years for N/V and abdominal pain, demanding only IV dilaudid   Elevated troponin    chronic   Esophageal reflux    Fibromyalgia    Gastric ulcer    Gastroparesis    Gout    Hyperlipidemia    Hypertension    Hypokalemia    Hypomagnesemia    Lumbosacral stenosis    LVH (left ventricular hypertrophy)    Morbid obesity (HCC)    NICM (nonischemic cardiomyopathy) (HCC)    PAF (paroxysmal atrial fibrillation) ([place])    Stroke ([place]) 02/2011   Thrombocytosis    Vitamin B12 deficiency anemia    Past Surgical History:  Procedure  Laterality Date   BIOPSY  07/27/2019   Procedure: BIOPSY;  Surgeon: Clarene Essex, MD;  Location: [facility];  Service: Endoscopy;;   BIOPSY  07/30/2019   Procedure: BIOPSY;  Surgeon: Otis Brace, MD;  Location: [facility];  Service: Gastroenterology;;   CATARACT EXTRACTION  01/2014   CHOLECYSTECTOMY     COLONOSCOPY WITH PROPOFOL N/A 07/30/2019   Procedure: COLONOSCOPY WITH PROPOFOL;  Surgeon: Otis Brace, MD;  Location: [facility];  Service: Gastroenterology;  Laterality: N/A;   ESOPHAGOGASTRODUODENOSCOPY N/A 07/27/2019   Procedure: ESOPHAGOGASTRODUODENOSCOPY (EGD);  Surgeon: Clarene Essex, MD;  Location: [facility];  Service: Endoscopy;  Laterality: N/A;   ESOPHAGOGASTRODUODENOSCOPY N/A 07/26/2020   Procedure: ESOPHAGOGASTRODUODENOSCOPY (EGD);  Surgeon: Arta Silence, MD;  Location: [facility];  Service: Endoscopy;  Laterality: N/A;   ESOPHAGOGASTRODUODENOSCOPY (EGD) WITH PROPOFOL N/A 08/02/2019   Procedure: ESOPHAGOGASTRODUODENOSCOPY (EGD) WITH PROPOFOL;  Surgeon: Otis Brace, MD;  Location: [facility];  Service: Gastroenterology;  Laterality: N/A;   HEMOSTASIS CLIP PLACEMENT  08/02/2019   Procedure: HEMOSTASIS CLIP PLACEMENT;  Surgeon: Otis Brace, MD;  Location: [facility];  Service: Gastroenterology;;   POLYPECTOMY  07/30/2019   Procedure: POLYPECTOMY;  Surgeon: Otis Brace, MD;  [facility];  Service: Gastroenterology;;   POLYPECTOMY  08/02/2019   Procedure: POLYPECTOMY;  Surgeon: Otis Brace, MD;  Location: [facility];  Service: Gastroenterology;;     A IV Location/Drains/Wounds Patient Lines/Drains/Airways Status     Active Line/Drains/Airways     Name Placement date Placement time Site Days   Peripheral IV 05/28/22 20 G 2.5" Right;Anterior;Upper Arm 05/28/22  2044  Arm  1            Intake/Output Last 24 hours No intake or output data in the 24 hours ending 05/29/22 [id number]  Labs/Imaging Results for orders placed or  performed during the hospital encounter of 05/28/22 (from the past 48 hour(s))  CBG monitoring, ED     Status: Abnormal   Collection Time: 05/28/22  5:44 PM  Result Value Ref Range   Glucose-Capillary 173 (H) 70 - 99 mg/dL    Comment: Glucose reference range applies only to samples taken after fasting for at least 8 hours.  Comprehensive metabolic panel     Status: Abnormal   Collection Time: 05/28/22  6:19 PM  Result Value Ref Range   Sodium 128 (L) 135 - 145 mmol/L   Potassium 4.8 3.5 - 5.1 mmol/L   Chloride 93 (L) 98 - 111 mmol/L   CO2 20 (L) 22 - 32 mmol/L   Glucose, Bld 193 (H) 70 - 99 mg/dL    Comment: Glucose reference range applies only to samples taken after fasting for at least 8 hours.   BUN 113 (H) 6 - 20 mg/dL   Creatinine, Ser 4.59 (H) 0.44 - 1.00 mg/dL  Calcium 9.2 8.9 - 10.3 mg/dL   Total Protein 8.0 6.5 - 8.1 g/dL   Albumin 2.4 (L) 3.5 - 5.0 g/dL   AST 22 15 - 41 U/L   ALT 17 0 - 44 U/L   Alkaline Phosphatase 91 38 - 126 U/L   Total Bilirubin 0.9 0.3 - 1.2 mg/dL   GFR, Estimated 10 (L) >60 mL/min    Comment: (NOTE) Calculated using the CKD-EPI Creatinine Equation (2021)    Anion gap 15 5 - 15    Comment: Performed at [facility] [address] [place], [place] [address]  CBC with Differential/Platelet     Status: Abnormal   Collection Time: 05/28/22  6:19 PM  Result Value Ref Range   WBC 17.0 (H) 4.0 - 10.5 K/uL   RBC 2.71 (L) 3.87 - 5.11 MIL/uL   Hemoglobin 7.2 (L) 12.0 - 15.0 g/dL   HCT 24.4 (L) 36.0 - 46.0 %   MCV 90.0 80.0 - 100.0 fL   MCH 26.6 26.0 - 34.0 pg   MCHC 29.5 (L) 30.0 - 36.0 g/dL   RDW 19.8 (H) 11.5 - 15.5 %   Platelets 602 (H) 150 - 400 K/uL   nRBC 0.0 0.0 - 0.2 %   Neutrophils Relative % 81 %   Neutro Abs 13.7 (H) 1.7 - 7.7 K/uL   Lymphocytes Relative 11 %   Lymphs Abs 1.8 0.7 - 4.0 K/uL   Monocytes Relative 6 %   Monocytes Absolute 1.1 (H) 0.1 - 1.0 K/uL   Eosinophils Relative 1 %   Eosinophils Absolute 0.2 0.0 - 0.5 K/uL    Basophils Relative 0 %   Basophils Absolute 0.1 0.0 - 0.1 K/uL   Immature Granulocytes 1 %   Abs Immature Granulocytes 0.18 (H) 0.00 - 0.07 K/uL    Comment: Performed at [facility] [address], [place], [place] [address]  Ammonia     Status: None   Collection Time: 05/28/22  6:19 PM  Result Value Ref Range   Ammonia 19 9 - 35 umol/L    Comment: Performed at [facility], [place] [address], [place], [place] [address]  Ethanol     Status: None   Collection Time: 05/28/22  6:19 PM  Result Value Ref Range   Alcohol, Ethyl (B) <10 <10 mg/dL    Comment: (NOTE) Lowest detectable limit for serum alcohol is 10 mg/dL.  For medical purposes only. Performed at [facility], [place] [address], [place], [place] [address]   I-Stat beta hCG blood, ED     Status: None   Collection Time: 05/28/22  6:25 PM  Result Value Ref Range   I-stat hCG, quantitative <5.0 <5 mIU/mL   Comment 3            Comment:   GEST. AGE      CONC.  (mIU/mL)   <=1 WEEK        5 - 50     2 WEEKS       50 - 500     3 WEEKS       100 - 10,000     4 WEEKS     1,000 - 30,000        FEMALE AND NON-PREGNANT FEMALE:     LESS THAN 5 mIU/mL   I-Stat venous blood gas, ED     Status: Abnormal   Collection Time: 05/28/22  6:25 PM  Result Value Ref Range  pH, Ven 7.497 (H) 7.25 - 7.43   pCO2, Ven 29.6 (L) 44 - 60 mmHg   pO2, Ven 31 (LL) 32 - 45 mmHg   Bicarbonate 22.9 20.0 - 28.0 mmol/L   TCO2 24 22 - 32 mmol/L   O2 Saturation 66 %   Acid-Base Excess 0.0 0.0 - 2.0 mmol/L   Sodium 130 (L) 135 - 145 mmol/L   Potassium 4.9 3.5 - 5.1 mmol/L   Calcium, Ion 1.03 (L) 1.15 - 1.40 mmol/L   HCT 24.0 (L) 36.0 - 46.0 %   Hemoglobin 8.2 (L) 12.0 - 15.0 g/dL   Sample type VENOUS    Comment NOTIFIED PHYSICIAN   POC CBG, ED     Status: Abnormal   Collection Time: 05/28/22  6:37 PM  Result Value Ref Range   Glucose-Capillary 174 (H) 70 - 99 mg/dL    Comment: Glucose reference range applies only to samples taken  after fasting for at least 8 hours.   CT HEAD WO CONTRAST  Result Date: 05/29/2022 CLINICAL DATA:  Altered mental status EXAM: CT HEAD WITHOUT CONTRAST TECHNIQUE: Contiguous axial images were obtained from the base of the skull through the vertex without intravenous contrast. RADIATION DOSE REDUCTION: This exam was performed according to the departmental dose-optimization program which includes automated exposure control, adjustment of the mA and/or kV according to patient size and/or use of iterative reconstruction technique. COMPARISON:  05/10/2022 FINDINGS: Brain: No evidence of acute infarction, hemorrhage, hydrocephalus, extra-axial collection or mass lesion/mass effect. Mild cortical atrophy suggested. Vascular: Mild intracranial arterial calcifications. Skull: Normal. Negative for fracture or focal lesion. Sinuses/Orbits: No acute finding. Other: Congenital nonunion of the posterior arch of C1. IMPRESSION: No acute intracranial abnormalities. No change since previous study. Electronically Signed   By: Lucienne Capers M.D.   On: 05/29/2022 02:36    Pending Labs Unresulted Labs (From admission, onward)     Start     Ordered   05/28/22 1734  Urinalysis, Routine w reflex microscopic -Urine, Clean Catch  Once,   URGENT       Question:  Specimen Source  Answer:  Urine, Clean Catch   05/28/22 1734   05/28/22 1734  Blood gas, venous  Once,   R        05/28/22 1734   05/28/22 1734  Rapid urine drug screen (hospital performed)  Once,   STAT        05/28/22 1734            Vitals/Pain Today's Vitals   05/28/22 2345 05/29/22 0130 05/29/22 0200 05/29/22 0330  BP: 119/66 (!) 120/53 127/64   Pulse:  96 95   Resp: 14 17 (!) 21   Temp:      TempSrc:      SpO2:  100% 100%   PainSc:    0-No pain    Isolation Precautions No active isolations  Medications Medications  haloperidol lactate (HALDOL) injection 5 mg (0 mg Intravenous Hold 05/29/22 0126)  midazolam (VERSED) injection 4 mg (0 mg  Intravenous Hold 05/29/22 0126)  guaiFENesin (ROBITUSSIN) 100 MG/5ML liquid 5 mL (has no administration in time range)  fentaNYL (SUBLIMAZE) injection 50 mcg (50 mcg Intravenous Given 05/28/22 2106)    Mobility non-ambulatory     Focused Assessments   R Recommendations: See Admitting Provider Note  Report given to:   Additional Notes: Provider informed pt has hx of being forgetful and confused at baseline. When doing tasks please explain what is going on. Pt has not  tried to get out of bed. Pt states she is unable to use her legs but she can bend her legs and open/close them.

## 2022-05-29 NOTE — ED Notes (Signed)
RN provided pt room phone to call brother, Lucianne Lei. Pt left a voicemail stating that she is sick and the hospital is trying to do surgery on her. Pt doesn't understand why an I/O will be done when she is sick.   RN explained to pt she isn't here for surgery. RN informed I/O are done routinely to verify if pt has infection that need to be treated. RN explained peri-care is provided to ensure new germs aren't introduced. Pt then asked why the doctor didn't collect the urine if she needs it so bad.   RN informed pt that the nurses are the ones that was trying to collect. The EDP just wanted to explain the reasons for collecting the urine and that pt lab work shows AKI. Pt requested to call brother again. RN dialed number and gave phone to pt.

## 2022-05-29 NOTE — ED Notes (Signed)
Offered sips of water to encourage urination for urine sample as discussed with Dr. Nechama Guard.

## 2022-05-29 NOTE — Progress Notes (Signed)
In to perform shift assessment and monitor progress.  Found in bed, leaning over left side rail wailing, crying, gagging and rambling. Notes not knowing where she is, and why she is here.  Attempts to reduce reassure safety, provide reality reorientation and promote rest have failed. Offered assistance with elimination and placed on bedpan. Tachycardic at 120s.  Reduced to low 100s with ongoing reassurance. Provider contacted for further medicinal support.

## 2022-05-30 DIAGNOSIS — N184 Chronic kidney disease, stage 4 (severe): Secondary | ICD-10-CM | POA: Diagnosis not present

## 2022-05-30 DIAGNOSIS — N179 Acute kidney failure, unspecified: Secondary | ICD-10-CM | POA: Diagnosis not present

## 2022-05-30 DIAGNOSIS — R4182 Altered mental status, unspecified: Secondary | ICD-10-CM | POA: Diagnosis not present

## 2022-05-30 LAB — GLUCOSE, CAPILLARY
Glucose-Capillary: 178 mg/dL — ABNORMAL HIGH (ref 70–99)
Glucose-Capillary: 477 mg/dL — ABNORMAL HIGH (ref 70–99)
Glucose-Capillary: 379 mg/dL — ABNORMAL HIGH (ref 70–99)
Glucose-Capillary: 341 mg/dL — ABNORMAL HIGH (ref 70–99)

## 2022-05-30 LAB — COMPREHENSIVE METABOLIC PANEL
ALT: 15 U/L (ref 0–44)
AST: 22 U/L (ref 15–41)
Albumin: 2.1 g/dL — ABNORMAL LOW (ref 3.5–5.0)
Alkaline Phosphatase: 74 U/L (ref 38–126)
Anion gap: 14 (ref 5–15)
BUN: 98 mg/dL — ABNORMAL HIGH (ref 6–20)
CO2: 19 mmol/L — ABNORMAL LOW (ref 22–32)
Calcium: 8.8 mg/dL — ABNORMAL LOW (ref 8.9–10.3)
Chloride: 103 mmol/L (ref 98–111)
Creatinine, Ser: 3.2 mg/dL — ABNORMAL HIGH (ref 0.44–1.00)
GFR, Estimated: 16 mL/min — ABNORMAL LOW (ref >60)
Glucose, Bld: 141 mg/dL — ABNORMAL HIGH (ref 70–99)
Potassium: 4.3 mmol/L (ref 3.5–5.1)
Sodium: 136 mmol/L (ref 135–145)
Total Bilirubin: 1.2 mg/dL (ref 0.3–1.2)
Total Protein: 7.2 g/dL (ref 6.5–8.1)

## 2022-05-30 LAB — CBC WITH DIFFERENTIAL/PLATELET
Abs Immature Granulocytes: 0.07 10*3/uL (ref 0.00–0.07)
Basophils Absolute: 0.1 10*3/uL (ref 0.0–0.1)
Basophils Relative: 0 %
Eosinophils Absolute: 0.3 10*3/uL (ref 0.0–0.5)
Eosinophils Relative: 2 %
HCT: 27.3 % — ABNORMAL LOW (ref 36.0–46.0)
Hemoglobin: 8.2 g/dL — ABNORMAL LOW (ref 12.0–15.0)
Immature Granulocytes: 1 %
Lymphocytes Relative: 14 %
Lymphs Abs: 1.7 10*3/uL (ref 0.7–4.0)
MCH: 26.5 pg (ref 26.0–34.0)
MCHC: 30 g/dL (ref 30.0–36.0)
MCV: 88.3 fL (ref 80.0–100.0)
Monocytes Absolute: 0.8 10*3/uL (ref 0.1–1.0)
Monocytes Relative: 7 %
Neutro Abs: 9.6 10*3/uL — ABNORMAL HIGH (ref 1.7–7.7)
Neutrophils Relative %: 76 %
Platelets: 420 10*3/uL — ABNORMAL HIGH (ref 150–400)
RBC: 3.09 MIL/uL — ABNORMAL LOW (ref 3.87–5.11)
RDW: 20.2 % — ABNORMAL HIGH (ref 11.5–15.5)
WBC: 12.5 10*3/uL — ABNORMAL HIGH (ref 4.0–10.5)
nRBC: 0 % (ref 0.0–0.2)

## 2022-05-30 LAB — TYPE AND SCREEN
ABO/RH(D): O POS
Antibody Screen: NEGATIVE
Donor AG Type: NEGATIVE
Donor AG Type: NEGATIVE
Unit division: 0
Unit division: 0

## 2022-05-30 LAB — BRAIN NATRIURETIC PEPTIDE: B Natriuretic Peptide: 54.7 pg/mL (ref 0.0–100.0)

## 2022-05-30 LAB — BPAM RBC
Blood Product Expiration Date: 202404292359
Blood Product Expiration Date: 202404292359
ISSUE DATE / TIME: 202403231143
ISSUE DATE / TIME: 202403231543
Unit Type and Rh: 5100
Unit Type and Rh: 5100

## 2022-05-30 LAB — PROCALCITONIN: Procalcitonin: 0.16 ng/mL

## 2022-05-30 LAB — C-REACTIVE PROTEIN: CRP: 14.2 mg/dL — ABNORMAL HIGH (ref <1.0)

## 2022-05-30 LAB — RAPID URINE DRUG SCREEN, HOSP PERFORMED
Amphetamines: NOT DETECTED
Barbiturates: NOT DETECTED
Benzodiazepines: NOT DETECTED
Cocaine: NOT DETECTED
Opiates: POSITIVE — AB
Tetrahydrocannabinol: NOT DETECTED

## 2022-05-30 LAB — MAGNESIUM: Magnesium: 2.1 mg/dL (ref 1.7–2.4)

## 2022-05-30 MED ORDER — METHYLPREDNISOLONE SODIUM SUCC 125 MG IJ SOLR
60.0000 mg | Freq: Every day | INTRAMUSCULAR | Status: DC
  Administered 2022-05-30: 60 mg via INTRAVENOUS
  Filled 2022-05-30: qty 2

## 2022-05-30 MED ORDER — FOLIC ACID 1 MG PO TABS
1.0000 mg | ORAL_TABLET | Freq: Every day | ORAL | Status: DC
  Administered 2022-05-30 – 2022-06-04 (×6): 1 mg via ORAL
  Filled 2022-05-30 (×6): qty 1

## 2022-05-30 MED ORDER — QUETIAPINE FUMARATE 25 MG PO TABS
25.0000 mg | ORAL_TABLET | Freq: Every day | ORAL | Status: DC
  Administered 2022-05-30 – 2022-05-31 (×2): 25 mg via ORAL
  Filled 2022-05-30 (×2): qty 1

## 2022-05-30 MED ORDER — QUETIAPINE FUMARATE 50 MG PO TABS: 50.0000 mg | ORAL_TABLET | Freq: Every day | ORAL | Status: DC

## 2022-05-30 MED ORDER — INSULIN GLARGINE-YFGN 100 UNIT/ML ~~LOC~~ SOLN
18.0000 [IU] | Freq: Every day | SUBCUTANEOUS | Status: DC
  Filled 2022-05-30: qty 0.18

## 2022-05-30 MED ORDER — INSULIN ASPART 100 UNIT/ML IJ SOLN: 4.0000 [IU] | Freq: Three times a day (TID) | INTRAMUSCULAR | Status: DC

## 2022-05-30 MED ORDER — INSULIN GLARGINE-YFGN 100 UNIT/ML ~~LOC~~ SOLN
14.0000 [IU] | Freq: Two times a day (BID) | SUBCUTANEOUS | Status: DC
  Administered 2022-05-30 (×2): 14 [IU] via SUBCUTANEOUS
  Filled 2022-05-30 (×3): qty 0.14

## 2022-05-30 MED ORDER — FERROUS SULFATE 325 (65 FE) MG PO TABS
325.0000 mg | ORAL_TABLET | Freq: Two times a day (BID) | ORAL | Status: DC
  Administered 2022-05-30 – 2022-06-04 (×10): 325 mg via ORAL
  Filled 2022-05-30 (×10): qty 1

## 2022-05-30 MED ORDER — LACTATED RINGERS IV SOLN: INTRAVENOUS | Status: DC

## 2022-05-30 MED ORDER — AMLODIPINE BESYLATE 10 MG PO TABS
10.0000 mg | ORAL_TABLET | Freq: Every day | ORAL | Status: DC
  Administered 2022-05-30 – 2022-06-04 (×6): 10 mg via ORAL
  Filled 2022-05-30 (×6): qty 1

## 2022-05-30 MED ORDER — HYDROMORPHONE HCL 2 MG PO TABS
1.0000 mg | ORAL_TABLET | Freq: Four times a day (QID) | ORAL | Status: DC | PRN
  Administered 2022-05-31 – 2022-06-01 (×5): 1 mg via ORAL
  Filled 2022-05-30 (×5): qty 1

## 2022-05-30 MED ORDER — MIRTAZAPINE 15 MG PO TBDP
15.0000 mg | ORAL_TABLET | Freq: Every day | ORAL | Status: DC
  Administered 2022-05-30 – 2022-06-03 (×5): 15 mg via ORAL
  Filled 2022-05-30 (×6): qty 1

## 2022-05-30 MED ORDER — FEBUXOSTAT 40 MG PO TABS
40.0000 mg | ORAL_TABLET | Freq: Every day | ORAL | Status: DC
  Administered 2022-05-30 – 2022-06-04 (×6): 40 mg via ORAL
  Filled 2022-05-30 (×6): qty 1

## 2022-05-30 MED ORDER — TRAMADOL HCL 50 MG PO TABS
50.0000 mg | ORAL_TABLET | Freq: Two times a day (BID) | ORAL | Status: DC | PRN
  Administered 2022-06-01: 50 mg via ORAL
  Filled 2022-05-30 (×2): qty 1

## 2022-05-30 MED ORDER — HALOPERIDOL LACTATE 5 MG/ML IJ SOLN
5.0000 mg | Freq: Once | INTRAMUSCULAR | Status: AC | PRN
  Administered 2022-05-30: 5 mg via INTRAVENOUS
  Filled 2022-05-30: qty 1

## 2022-05-30 MED ORDER — COLCHICINE 0.3 MG HALF TABLET
0.3000 mg | ORAL_TABLET | Freq: Every day | ORAL | Status: DC
  Administered 2022-05-30 – 2022-06-04 (×6): 0.3 mg via ORAL
  Filled 2022-05-30 (×6): qty 1

## 2022-05-30 NOTE — Progress Notes (Signed)
Yelling "Hello!", into the hall. Provider notified of POCBG levels and reduced po.  Repeat order received for haloperidol and LR. Administered at this time.  Will continue to monitor for changes.

## 2022-05-30 NOTE — Progress Notes (Signed)
Orders received per provider for x1 dose of 5 haloperidol IVP and melatonin.  Melatonin held due to pt refusal to accept routinely scheduled oral medications. CN in to assist. Visited by RR nurse. No changes to current POC and no immediate threat observed. Positive response to haldol.  Will continue to monitor. RR/EUL.  NAD. HR remains in ST @ 102.

## 2022-05-30 NOTE — Evaluation (Signed)
Occupational Therapy Evaluation Patient Details Name: Deborah [name] MRN: [id number] DOB: 1965-06-11 Today's Date: 05/30/2022   History of Present Illness 57 y/o F admitted to [facility] on 3/22 for AMS. Head CT negative. Pt with recent admission 3/4-3/21 where she was discharged to a SNF. PMHx: DM, CHF, CVA, duodenal carcinoid tumor resected in 2021, AKI, anemia, chronic pain, CKD, PAF on eliquis, lumbar spinal stenosis   Clinical Impression   Pt from SNF, has difficulty recalling prior mobility, states "I haven't been out of bed in a long time", prior to SNF admission, pt was living alone with PCA 2 hrs/day. Pt currently needing min-max A for ADLs, min A for bed mobility, and mod A for sit to stand transfer from elevated bed height using RW. Pt able to stand for ~10 seconds for bed pad change. Pt HR up to 130 during session. Pt presenting with impairments listed below, will follow acutely. Pt to benefit from postacute rehab (<3 hrs/day) to maximize safety and independence with ADLs prior to d/c home.     Recommendations for follow up therapy are one component of a multi-disciplinary discharge planning process, led by the attending physician.  Recommendations may be updated based on patient status, additional functional criteria and insurance authorization.   Follow Up Recommendations  Skilled nursing-short term rehab (<3 hours/day)     Assistance Recommended at Discharge Frequent or constant Supervision/Assistance  Patient can return home with the following Assist for transportation;Assistance with cooking/housework;A lot of help with walking and/or transfers;A lot of help with bathing/dressing/bathroom    Functional Status Assessment  Patient has had a recent decline in their functional status and demonstrates the ability to make significant improvements in function in a reasonable and predictable amount of time.  Equipment Recommendations  BSC/3in1    Recommendations for Other Services  PT consult     Precautions / Restrictions Precautions Precautions: Fall Restrictions Weight Bearing Restrictions: No      Mobility Bed Mobility Overal bed mobility: Needs Assistance Bed Mobility: Rolling Rolling: Min assist   Supine to sit: Min assist Sit to supine: Min assist        Transfers Overall transfer level: Needs assistance Equipment used: Rolling walker (2 wheels) Transfers: Sit to/from Stand Sit to Stand: Mod assist           General transfer comment: mod A, pt declines transfer or taking lateral steps toward [facility] at this time      Balance Overall balance assessment: Needs assistance Sitting-balance support: Bilateral upper extremity supported, Feet unsupported Sitting balance-Leahy Scale: Fair       Standing balance-Leahy Scale: Poor Standing balance comment: reliant on external support                           ADL either performed or assessed with clinical judgement   ADL Overall ADL's : Needs assistance/impaired Eating/Feeding: Set up;Sitting;Bed level   Grooming: Set up;Sitting;Bed level   Upper Body Bathing: Bed level;Sitting;Minimal assistance   Lower Body Bathing: Moderate assistance;Bed level;Sitting/lateral leans   Upper Body Dressing : Minimal assistance;Sitting;Bed level   Lower Body Dressing: Moderate assistance;Sitting/lateral leans;Bed level   Toilet Transfer: Moderate assistance   Toileting- Clothing Manipulation and Hygiene: Maximal assistance;Bed level       Functional mobility during ADLs: Moderate assistance       Vision   Vision Assessment?: No apparent visual deficits     Perception Perception Perception Tested?: No   Praxis Praxis Praxis tested?:  Not tested    Pertinent Vitals/Pain Pain Assessment Pain Assessment: Faces Pain Score: 7  Faces Pain Scale: Hurts even more Pain Location: BLE pain Pain Descriptors / Indicators: Discomfort, Constant Pain Intervention(s): Limited activity  within patient's tolerance, Monitored during session, Repositioned     Hand Dominance     Extremity/Trunk Assessment Upper Extremity Assessment Upper Extremity Assessment: Generalized weakness   Lower Extremity Assessment Lower Extremity Assessment: Defer to PT evaluation   Cervical / Trunk Assessment Cervical / Trunk Assessment: Kyphotic   Communication Communication Communication: No difficulties   Cognition Arousal/Alertness: Awake/alert Behavior During Therapy: WFL for tasks assessed/performed   Area of Impairment: Problem solving, Safety/judgement, Following commands, Memory                     Memory: Decreased short-term memory (unaware how long she was at rehab) Following Commands: Follows one step commands with increased time Safety/Judgement: Decreased awareness of deficits   Problem Solving: Slow processing, Decreased initiation, Difficulty sequencing, Requires verbal cues       General Comments  HR 130s with mobility    Exercises     Shoulder Instructions      Home Living Family/patient expects to be discharged to:: Skilled nursing facility Living Arrangements: Alone Available Help at Discharge: Personal care attendant;Available PRN/intermittently Type of Home: Apartment Home Access: Stairs to enter Entrance Stairs-Number of Steps: 2 Entrance Stairs-Rails: None Home Layout: One level     Bathroom Shower/Tub: Teacher, early years/pre: Standard     Home Equipment: BSC/3in1;Shower seat;Wheelchair - manual   Additional Comments: Anticipate discharge back to SNF, as pt only there 1 day, no family to confirm and pt poor historian, most home set up obtained from recent admission. At home pt had PCA 2.5 hours/day      Prior Functioning/Environment Prior Level of Function : Needs assist             Mobility Comments: Per chart review, pt wheelchair bound over the past few months, recently requiring assistance for transfers, at  [facility] but readmitted back to [facility]          OT Problem List: Decreased strength;Decreased range of motion;Decreased activity tolerance;Impaired balance (sitting and/or standing);Impaired UE functional use;Impaired sensation;Pain;Decreased cognition;Decreased safety awareness      OT Treatment/Interventions: Self-care/ADL training;Therapeutic activities;Therapeutic exercise;Patient/family education;Balance training    OT Goals(Current goals can be found in the care plan section) Acute Rehab OT Goals Patient Stated Goal: to go home OT Goal Formulation: With patient Time For Goal Achievement: 06/13/22 Potential to Achieve Goals: Fair ADL Goals Pt Will Perform Upper Body Dressing: with min guard assist;sitting Pt Will Perform Lower Body Dressing: with min guard assist;sitting/lateral leans;sit to/from stand Pt Will Transfer to Toilet: with min assist;squat pivot transfer;stand pivot transfer;bedside commode Additional ADL Goal #1: pt will perform bed mobility with supervision in prep for ADLs  OT Frequency: Min 2X/week    Co-evaluation              AM-PAC OT "6 Clicks" Daily Activity     Outcome Measure Help from another person eating meals?: A Little Help from another person taking care of personal grooming?: A Little Help from another person toileting, which includes using toliet, bedpan, or urinal?: A Lot Help from another person bathing (including washing, rinsing, drying)?: A Lot Help from another person to put on and taking off regular upper body clothing?: A Little Help from another person to put on and taking off  regular lower body clothing?: A Lot 6 Click Score: 15   End of Session Equipment Utilized During Treatment: Gait belt;Rolling walker (2 wheels) Nurse Communication: Mobility status  Activity Tolerance: Other (comment) (self-limiting) Patient left: in bed;with call bell/phone within reach;with bed alarm set;with family/visitor present;with nursing/sitter in  room(seated EOB)  OT Visit Diagnosis: Unsteadiness on feet (R26.81);Pain;Other abnormalities of gait and mobility (R26.89);Muscle weakness (generalized) (M62.81)                Time: [id number] OT Time Calculation (min): 43 min Charges:  OT General Charges $OT Visit: 1 Visit OT Evaluation $OT Eval Moderate Complexity: 1 Mod OT Treatments $Self Care/Home Management : 23-37 mins  Renaye Rakers, OTD, OTR/L SecureChat Preferred Acute Rehab [phone]  Renaye Rakers [place] 05/30/2022, 4:31 PM

## 2022-05-30 NOTE — Progress Notes (Signed)
Specimen for UDS collected and sent. Approved via charge.

## 2022-05-30 NOTE — Progress Notes (Signed)
OT Cancellation Note  Patient Details Name: Deborah [name] MRN: [id number] DOB: 12/12/1965   Cancelled Treatment:    Reason Eval/Treat Not Completed: Fatigue/lethargy limiting ability to participate (per chart review,pt recieved Haldol yesterday, per RN is still sleepy. Will follow up for OT evaluation as appropriate.)  Renaye Rakers, OTD, OTR/L SecureChat Preferred Acute Rehab [phone] [place] 05/30/2022, 1:06 PM

## 2022-05-30 NOTE — TOC Initial Note (Signed)
Transition of Care [facility]) - Initial/Assessment Note    Patient Details  Name: Deborah [name] MRN: [id number] Date of Birth: May 15, 1965  Transition of Care [facility]) CM/SW Contact:    Milas Gain, [place] Phone Number: 05/30/2022, 11:41 AM  Clinical Narrative:                  CSW received consult for possible SNF placement at time of discharge. Due to patients current orientation CSW spoke with patients mother Vicente Males regarding PT recommendation of SNF placement for patient at time of discharge. Patients Mother reports PTA patient came from [facility] short term. Patients mother expressed understanding of PT recommendation and is agreeable to SNF placement for patient at time of discharge. Patients mother reports preference for patient to return back to [facility] for short term rehab. CSW discussed insurance authorization process with patients mother.  No further questions reported at this time. CSW to continue to follow and assist with discharge planning needs.    Expected Discharge Plan: [place] Barriers to Discharge: Continued Medical Work up   Patient Goals and CMS Choice     Choice offered to / list presented to : Parent (mother)      Expected Discharge Plan and Services In-house Referral: Clinical Social Work   Post Acute Care Choice: [place] Living arrangements for the past 2 months:  (from [facility] term)                                      Prior Living Arrangements/Services Living arrangements for the past 2 months:  (from [facility] short term)   Patient language and need for interpreter reviewed:: Yes        Need for Family Participation in Patient Care: Yes (Comment) Care giver support system in place?: Yes (comment)   Criminal Activity/Legal Involvement Pertinent to Current Situation/Hospitalization: No - Comment as needed  Activities of Daily Living Home Assistive Devices/Equipment: Wheelchair ADL  Screening (condition at time of admission) Patient's cognitive ability adequate to safely complete daily activities?: Yes Is the patient deaf or have difficulty hearing?: No Does the patient have difficulty seeing, even when wearing glasses/contacts?: No Does the patient have difficulty concentrating, remembering, or making decisions?: No Patient able to express need for assistance with ADLs?: No Does the patient have difficulty dressing or bathing?:  (assistance) Independently performs ADLs?: Yes (appropriate for developmental age) Communication: Independent Dressing (OT): Needs assistance Grooming: Needs assistance Feeding: Independent Bathing: Needs assistance Toileting: Needs assistance In/Out Bed: Needs assistance Walks in Home: Dependent Does the patient have difficulty walking or climbing stairs?: Yes Weakness of Legs: Both Weakness of Arms/Hands: None  Permission Sought/Granted Permission sought to share information with : Case Manager, Family Supports, Chartered certified accountant granted to share information with : No  Share Information with NAME: Due to current orientation CSW spoke with patients mother Vicente Males  Permission granted to share info w AGENCY: Due to current orientation CSW spoke with patients mother Anna/SNF  Permission granted to share info w Relationship: Due to current orientation CSW spoke with patients mother Vicente Males  Permission granted to share info w Contact Information: Due to current orientation CSW spoke with patients mother Vicente Males [phone]  Emotional Assessment       Orientation: : Oriented to Self, Oriented to Place Alcohol / Substance Use: Not Applicable Psych Involvement: No (comment)  Admission diagnosis:  AKI (  acute kidney injury) ([place]) [N17.9] Altered mental status, unspecified altered mental status type [R41.82] Acute renal failure superimposed on stage 4 chronic kidney disease ([place]) [N17.9, N18.4] Patient Active Problem List    Diagnosis Date Noted   Acute renal failure superimposed on stage 4 chronic kidney disease ([place]) 05/29/2022   Acute encephalopathy 05/29/2022   Hyponatremia 05/29/2022   Anemia 05/22/2022   Hyperosmolar hyperglycemic state (HHS) ([place]) 05/11/2022   Hyperosmolar non-ketotic state due to type 2 diabetes mellitus ([place]) 05/11/2022   Leukocytosis 05/11/2022   Edema of left upper extremity 03/21/2022   Tachycardia 03/18/2022   Atypical chest pain 09/10/2021   Acute pyelonephritis    Acute kidney injury superimposed on chronic kidney disease ([place]) 08/16/2021   Acute on chronic kidney failure ([place]) 08/15/2021   PAF (paroxysmal atrial fibrillation) ([place])    Chronic pain 09/04/2019   Malignant carcinoid tumor of duodenum ([place])    Nonalcoholic fatty liver disease 06/05/2019   Restrictive lung disease secondary to obesity    History of gastric ulcer    Fibroid uterus 02/23/2019   Abdominal pain 07/17/2018   Anxiety 11/29/2017   Spinal stenosis, lumbar region with neurogenic claudication 08/03/2017   GERD (gastroesophageal reflux disease) 03/19/2017   Depression 03/19/2017   Morbid obesity ([place])    Normocytic anemia 08/16/2016   Diabetic gastroparesis ([place]) 08/16/2016   Gout 06/05/2016   Hypomagnesemia and hypokalemia 09/26/2015   Type 2 diabetes mellitus with hyperglycemia, with long-term current use of insulin ([place]) 05/25/2015   Chronic kidney disease (CKD), stage IV (severe) ([place]) 05/25/2015   Essential hypertension, benign 09/28/2013   PCP:  Cena Benton, MD Pharmacy:   [place], [place] - [address] [place] [address] Phone: [phone] Fax: [phone]  [facility] [address] [place] [address] Phone: [phone] Fax: [phone]     Social Determinants of Health (SDOH) Social History: SDOH Screenings   Food Insecurity: Food Insecurity Present (05/29/2022)   Housing: High Risk (05/29/2022)  Transportation Needs: Unmet Transportation Needs (05/29/2022)  Utilities: At Risk (05/29/2022)  Depression (PHQ2-9): Medium Risk (09/16/2021)  Tobacco Use: Low Risk  (05/29/2022)   SDOH Interventions:     Readmission Risk Interventions     No data to display

## 2022-05-30 NOTE — Progress Notes (Addendum)
PROGRESS NOTE                                                                                                                                                                                                             Patient Demographics:    Deborah [name], is a 57 y.o. female, DOB - 1965/07/06, [id number]  Outpatient Primary MD for the patient is Cena Benton, MD    LOS - 1  Admit date - 05/28/2022    Chief Complaint  Patient presents with   Altered Mental Status       Brief Narrative (HPI from H&P)   57 y.o. female with medical history significant for insulin-dependent diabetes mellitus, hypertension, PAF on Eliquis, CKD stage IV, iron deficiency anemia, chronic pain, duodenal carcinoid tumor resected in 2021, and recent protracted admission for HHS which was complicated by AKI, transfusion dependent anemia, and difficult pain control, now presenting with altered mental status.   Patient was discharged to an SNF on 05/27/2022, was noted by SNF personnel to be altered all throughout the day, and she was eventually sent to the ED.  Patient has no acute complaints in the emergency department and does not understand why she was sent here.  She denies chest pain, nausea, vomiting, or diarrhea.  She denies fever or chills, she denies dysuria, hematuria, or flank pain.   In the ED her workup was suggestive of dehydration, AKI and metabolic encephalopathy, head CT was unremarkable, with ammonia level was stable, blood gas did not suggest any CO2 narcosis, she was had no focal deficits and was kept in the hospital for further treatment.  The workup suggested disseminated gout.   Subjective:   Patient in bed, appears comfortable, denies any headache, no fever, no chest pain or pressure, no shortness of breath , no abdominal pain. No focal weakness.   Assessment  & Plan :     1.  Acute metabolic encephalopathy due  to dehydration, AKI on CKD IV & extremely elevated uric acid levels causing disseminated gout -  - Her baseline creatinine is around 3.8, renal function has certainly worsened, she is being hydrated with IV fluids, urine electrolytes ordered, renal ultrasound also ordered, will monitor frequent bladder scans.  Head CT unremarkable, no focal deficits, stable ammonia levels, blood gas does not suggest CO2 retention, will chest x-ray,  stable UA, stable procalcitonin, stable EEG.    Uric acid levels were extremely elevated, this is suggestive of disseminated gout with disseminated inflammation with extremely elevated uric acid levels, elevated ferritin, CRP with stable calcitonin levels when accounted for renal dysfunction, causing dehydration, AKI and mental status change, she was hydrated, given challenge with IV steroids placed on colchicine and Uloric,  excellent improvement in mentation.  AKI improving as well with IV fluids, close to baseline, add Seroquel QHS, continue monitoring.   2. Acute encephalopathy  -See above   3. Lumbar spinal stenosis  - With b/l LE weakness, wheelchair-bound for months  - MRI during recent admission reviewed by Dr. Ronnald Ramp of neurosurgery who recommended PT and outpatient follow-up  - Continue PT and outpatient neurosurgery follow-up as planned    4. Anemia, transfusion dependent.  Negative FOBT recently. - Hgb dropped from 7.2 at the time of admission due to heme dilution, type screen 2 units of packed RBC transfusion, continue PPI, no signs of ongoing bleeding, stable anemia panel, pending peripheral smear.   5. PAF  - Continue Eliquis and metoprolol     6. Leukocytosis; thrombocytosis - chronic - Appears chronic and stable, no apparent infection, recent procalcitonin levels were low, and she has been advised to follow-up with hematology, most of the changes are chronic, check smear, procalcitonin and CRP.   7.  Hyponatremia  -Hydrate and monitor  8.   Insulin-dependent DM  - A1c was 10.1% in January 2024  - Check CBGs and continue long- and short-acting insulin, since she has been exposed to steroids dose adjusted for insulin on 05/30/2022   CBG (last 3)  Recent Labs    05/29/22 1610 05/29/22 2055 05/30/22 0814  GLUCAP 92 109* 178*    Lab Results  Component Value Date   HGBA1C 10.1 (H) 03/21/2022        Condition - Extremely Guarded  Family Communication  : Called mother Vicente Males [phone]  on 05/29/2022 at 10:49 AM, POA [place] [phone] [place]  Code Status :  Full  Consults  :  None  PUD Prophylaxis : PPI   Procedures  :     CT head.  Nonacute.    EEG. Non acute  Renal US - Non acute      Disposition Plan  :    Status is: Observation  DVT Prophylaxis  :     apixaban (ELIQUIS) tablet 5 mg     Lab Results  Component Value Date   PLT 420 (H) 05/30/2022    Diet :  Diet Order             Diet Carb Modified Fluid consistency: Thin; Room service appropriate? Yes  Diet effective now                    Inpatient Medications  Scheduled Meds:  apixaban  5 mg Oral BID   atorvastatin  10 mg Oral Daily   colchicine  0.3 mg Oral Daily   DULoxetine  40 mg Oral BID   febuxostat  40 mg Oral Daily   insulin aspart  0-5 Units Subcutaneous QHS   insulin aspart  0-9 Units Subcutaneous TID WC   insulin aspart  6 Units Subcutaneous TID WC   insulin glargine-yfgn  18 Units Subcutaneous QHS   methylPREDNISolone (SOLU-MEDROL) injection  60 mg Intravenous Daily   metoprolol succinate  50 mg Oral Daily   pantoprazole  40 mg Oral BID   sodium bicarbonate  650  mg Oral BID   Continuous Infusions:  lactated ringers 50 mL/hr at 05/30/22 0819   PRN Meds:.acetaminophen **OR** acetaminophen, albuterol, fentaNYL (SUBLIMAZE) injection, guaiFENesin, HYDROcodone-acetaminophen, melatonin, naLOXone (NARCAN)  injection, ondansetron **OR** ondansetron (ZOFRAN) IV, mouth rinse  Antibiotics  :    Anti-infectives (From  admission, onward)    None         Objective:   Vitals:   05/29/22 2000 05/29/22 2300 05/30/22 0314 05/30/22 0852  BP:  123/71 130/78 (!) 154/82  Pulse:   (!) 104 (!) 104  Resp:   12 (!) 25  Temp: 98.2 F (36.8 C) (!) 97.5 F (36.4 C) (!) 97.5 F (36.4 C)   TempSrc: Oral Axillary Oral   SpO2: 96%  96% 97%  Weight:        Wt Readings from Last 3 Encounters:  05/29/22 132.4 kg  05/13/22 (!) 139.2 kg  03/20/22 115.2 kg     Intake/Output Summary (Last 24 hours) at 05/30/2022 0918 Last data filed at 05/30/2022 0810 Gross per 24 hour  Intake 1394.13 ml  Output 1950 ml  Net -555.87 ml     Physical Exam  Awake Alert, No new F.N deficits, Normal affect [place].AT,PERRAL Supple Neck, No JVD,   Symmetrical Chest wall movement, Good air movement bilaterally, CTAB RRR,No Gallops, Rubs or new Murmurs,  +ve B.Sounds, Abd Soft, No tenderness,   No Cyanosis, Clubbing or edema         Data Review:    Recent Labs  Lab 05/25/22 0310 05/26/22 0328 05/28/22 1819 05/28/22 1825 05/29/22 0728 05/29/22 1933 05/30/22 0357  WBC 17.7* 17.6* 17.0*  --  16.7*  --  12.5*  HGB 7.5* 7.6* 7.2* 8.2* 6.7* 8.0* 8.2*  HCT 24.3* 25.2* 24.4* 24.0* 21.5* 26.0* 27.3*  PLT 657* 705* 602*  --  576*  --  420*  MCV 86.8 86.9 90.0  --  86.3  --  88.3  MCH 26.8 26.2 26.6  --  26.9  --  26.5  MCHC 30.9 30.2 29.5*  --  31.2  --  30.0  RDW 18.8* 19.1* 19.8*  --  19.6*  --  20.2*  LYMPHSABS  --   --  1.8  --   --   --  1.7  MONOABS  --   --  1.1*  --   --   --  0.8  EOSABS  --   --  0.2  --   --   --  0.3  BASOSABS  --   --  0.1  --   --   --  0.1    Recent Labs  Lab 05/25/22 0310 05/26/22 0328 05/28/22 1819 05/28/22 1825 05/29/22 0728 05/29/22 1038 05/30/22 0357  NA 130* 130* 128* 130* 131*  --  136  K 4.6 4.2 4.8 4.9 4.3  --  4.3  CL 94* 94* 93*  --  91*  --  103  CO2 22 23 20*  --  23  --  19*  ANIONGAP 14 13 15   --  17*  --  14  GLUCOSE 241* 228* 193*  --  215*  --  141*  BUN 93*  96* 113*  --  112*  --  98*  CREATININE 3.36* 3.87* 4.59*  --  4.44*  --  3.20*  AST  --   --  22  --   --   --  22  ALT  --   --  17  --   --   --  15  ALKPHOS  --   --  91  --   --   --  74  BILITOT  --   --  0.9  --   --   --  1.2  ALBUMIN  --   --  2.4*  --   --   --  2.1*  CRP  --   --   --   --   --  19.6* 14.2*  PROCALCITON  --   --   --   --   --  0.28 0.16  TSH  --   --   --   --   --  1.976  --   AMMONIA  --   --  19  --   --   --   --   BNP  --   --   --   --   --   --  54.7  MG 2.0  --   --   --  2.2  --  2.1  CALCIUM 9.3 9.4 9.2  --  9.2  --  8.8*      Recent Labs  Lab 05/25/22 0310 05/26/22 0328 05/28/22 1819 05/29/22 0728 05/29/22 1038 05/30/22 0357  CRP  --   --   --   --  19.6* 14.2*  PROCALCITON  --   --   --   --  0.28 0.16  TSH  --   --   --   --  1.976  --   AMMONIA  --   --  19  --   --   --   BNP  --   --   --   --   --  54.7  MG 2.0  --   --  2.2  --  2.1  CALCIUM 9.3 9.4 9.2 9.2  --  8.8*    No results for input(s): "CHOL", "HDL", "LDLCALC", "TRIG", "CHOLHDL", "LDLDIRECT" in the last 72 hours.  Lab Results  Component Value Date   HGBA1C 10.1 (H) 03/21/2022        Radiology Reports US RENAL  Result Date: 05/29/2022 CLINICAL DATA:  Acute kidney injury EXAM: RENAL / URINARY TRACT ULTRASOUND COMPLETE COMPARISON:  03/19/2022 renal ultrasound FINDINGS: Right Kidney: Renal measurements: 11.5 x 5.0 x 5.3 cm = volume: 159 mL. Echogenicity within normal limits. No mass or hydronephrosis visualized. Left Kidney: Renal measurements: 10.5 x 6.1 x 4.8 cm = volume: 161 mL. Echogenicity within normal limits. Nonshadowing echogenic foci are noted in the mid left kidney, but no mass or hydronephrosis visualized. Bladder: Appears normal for degree of bladder distention. Bladder jets were not visualized. Other: Evaluation is somewhat limited by body habitus and overlying bowel gas. IMPRESSION: No significant sonographic abnormality of the kidneys. Electronically  Signed   By: Merilyn Baba M.D.   On: 05/29/2022 12:01   EEG adult  Result Date: 05/29/2022 Lora Havens, MD     05/29/2022  2:06 PM Patient Name: TWILIA BERHE MRN: [id number] Epilepsy Attending: Lora Havens Referring Physician/Provider: Thurnell Lose, MD Date: 05/29/2022 Duration: 22.35 mins Patient history: 57yo F with ams. EEG to evaluate for seizure Level of alertness: Awake AEDs during EEG study: None Technical aspects: This EEG study was done with scalp electrodes positioned according to the 10-20 International system of electrode placement. Electrical activity was reviewed with band pass filter of 1-70Hz , sensitivity of 7 uV/mm, display speed of 12mm/sec with a 60Hz  notched filter applied as appropriate. EEG  data were recorded continuously and digitally stored.  Video monitoring was available and reviewed as appropriate. Description: EEG showed continuous generalized 3 to 6 Hz theta-delta slowing. Generalized periodic discharges with triphasic morphology at  1Hz  were also noted. Hyperventilation and photic stimulation were not performed.   ABNORMALITY - Periodic discharges with triphasic morphology, generalized ( GPDs) - Continuous slow, generalized IMPRESSION: This study is suggestive of moderate diffuse encephalopathy, nonspecific etiology but likely related to toxic-metabolic causes. No seizures or definite epileptiform discharges were seen throughout the recording. Lora Havens   DG Chest Port 1 View  Result Date: 05/29/2022 CLINICAL DATA:  57 year old female with shortness of breath. EXAM: PORTABLE CHEST 1 VIEW COMPARISON:  CT Chest, Abdomen, and Pelvis 05/10/2022, and earlier. FINDINGS: Portable AP upright view at 0553 hours. Lung volumes and mediastinal contours are within normal limits. Visualized tracheal air column is within normal limits. Allowing for portable technique the lungs are clear. No pneumothorax or pleural effusion. Paucity bowel gas in the visible abdomen.  No acute osseous abnormality identified. IMPRESSION: Negative portable chest. Electronically Signed   By: Genevie Ann M.D.   On: 05/29/2022 06:16   CT HEAD WO CONTRAST  Result Date: 05/29/2022 CLINICAL DATA:  Altered mental status EXAM: CT HEAD WITHOUT CONTRAST TECHNIQUE: Contiguous axial images were obtained from the base of the skull through the vertex without intravenous contrast. RADIATION DOSE REDUCTION: This exam was performed according to the departmental dose-optimization program which includes automated exposure control, adjustment of the mA and/or kV according to patient size and/or use of iterative reconstruction technique. COMPARISON:  05/10/2022 FINDINGS: Brain: No evidence of acute infarction, hemorrhage, hydrocephalus, extra-axial collection or mass lesion/mass effect. Mild cortical atrophy suggested. Vascular: Mild intracranial arterial calcifications. Skull: Normal. Negative for fracture or focal lesion. Sinuses/Orbits: No acute finding. Other: Congenital nonunion of the posterior arch of C1. IMPRESSION: No acute intracranial abnormalities. No change since previous study. Electronically Signed   By: Lucienne Capers M.D.   On: 05/29/2022 02:36      Signature  -   Lala Lund M.D on 05/30/2022 at 9:18 AM   -  To page go to [email or web address]

## 2022-05-30 NOTE — NC FL2 (Signed)
[place] LEVEL OF CARE FORM     IDENTIFICATION  Patient Name: Deborah [name] Birthdate: 1965-10-04 Sex: female Admission Date (Current Location): 05/28/2022  [facility] and [place] Number:  Herbalist and Address:  [facility] [place], [place] [address], [place], [place] [address]      Provider Number: [id number]  Attending Physician Name and Address:  Thurnell Lose, MD  Relative Name and Phone Number:  Vicente Males (Mother) [phone]    Current Level of Care: Hospital Recommended Level of Care: [place] Prior Approval Number:    Date Approved/Denied:   PASRR Number: [id number]  Discharge Plan: SNF    Current Diagnoses: Patient Active Problem List   Diagnosis Date Noted   Acute renal failure superimposed on stage 4 chronic kidney disease ([place]) 05/29/2022   Acute encephalopathy 05/29/2022   Hyponatremia 05/29/2022   Anemia 05/22/2022   Hyperosmolar hyperglycemic state (HHS) ([place]) 05/11/2022   Hyperosmolar non-ketotic state due to type 2 diabetes mellitus ([place]) 05/11/2022   Leukocytosis 05/11/2022   Edema of left upper extremity 03/21/2022   Tachycardia 03/18/2022   Atypical chest pain 09/10/2021   Acute pyelonephritis    Acute kidney injury superimposed on chronic kidney disease ([place]) 08/16/2021   Acute on chronic kidney failure ([place]) 08/15/2021   PAF (paroxysmal atrial fibrillation) ([place])    Chronic pain 09/04/2019   Malignant carcinoid tumor of duodenum ([place])    Nonalcoholic fatty liver disease 06/05/2019   Restrictive lung disease secondary to obesity    History of gastric ulcer    Fibroid uterus 02/23/2019   Abdominal pain 07/17/2018   Anxiety 11/29/2017   Spinal stenosis, lumbar region with neurogenic claudication 08/03/2017   GERD (gastroesophageal reflux disease) 03/19/2017   Depression 03/19/2017   Morbid obesity ([place])    Normocytic anemia 08/16/2016   Diabetic gastroparesis ([place]) 08/16/2016    Gout 06/05/2016   Hypomagnesemia and hypokalemia 09/26/2015   Type 2 diabetes mellitus with hyperglycemia, with long-term current use of insulin (HCC) 05/25/2015   Chronic kidney disease (CKD), stage IV (severe) ([place]) 05/25/2015   Essential hypertension, benign 09/28/2013    Orientation RESPIRATION BLADDER Height & Weight     Self, Place  Normal Continent, External catheter (External Urinary Catheter) Weight: 291 lb 14.2 oz (132.4 kg) Height:     BEHAVIORAL SYMPTOMS/MOOD NEUROLOGICAL BOWEL NUTRITION STATUS      Continent Diet (Please see discharge summary)  AMBULATORY STATUS COMMUNICATION OF NEEDS Skin   Extensive Assist Verbally Other (Comment) (Appropriate for ethnicity,Dry,Erythema,Breast,Buttocks,Bil.,Wound/Incision LDAs)                       Personal Care Assistance Level of Assistance  Bathing, Feeding, Dressing Bathing Assistance: Maximum assistance Feeding assistance: Limited assistance Dressing Assistance: Maximum assistance     Functional Limitations Info  Sight, Speech, Hearing Sight Info:  (Reading glasses) Hearing Info: Adequate Speech Info: Adequate    SPECIAL CARE FACTORS FREQUENCY  PT (By licensed PT), OT (By licensed OT)     PT Frequency: 5x min weekly OT Frequency: 5x min weekly            Contractures Contractures Info: Not present    Additional Factors Info  Code Status, Allergies, Insulin Sliding Scale, Psychotropic Code Status Info: FULL Allergies Info: Diazepam,Gabapentin,Iodinated Contrast Media,Isovue (iopamidol),Lisinopril,Metoclopramide,Nsaids,Penicillins,Tolmetin,Dicyclomine,Acetaminophen,Cyclobenzaprine,Oxycodone,Rifamycins,Tramadol Psychotropic Info: QUEtiapine (SEROQUEL) tablet 50 mg daily Insulin Sliding Scale Info: insulin aspart (novoLOG) injection 0-5 Units daily at bedtime,  insulin aspart (novoLOG) injection 0-9 Units  3 times daily with meals,  insulin aspart (novoLOG) injection 4 Units 3 times daily with meals,  insulin  glargine-yfgn (SEMGLEE) injection 14 Units 2 times daily,       Current Medications (05/30/2022):  This is the current hospital active medication list Current Facility-Administered Medications  Medication Dose Route Frequency Provider Last Rate Last Admin   acetaminophen (TYLENOL) tablet 650 mg  650 mg Oral Q6H PRN Opyd, Ilene Qua, MD       Or   acetaminophen (TYLENOL) suppository 650 mg  650 mg Rectal Q6H PRN Opyd, Ilene Qua, MD       albuterol (PROVENTIL) (2.5 MG/3ML) 0.083% nebulizer solution 2.5 mg  2.5 mg Nebulization Q6H PRN Opyd, Ilene Qua, MD       amLODipine (NORVASC) tablet 10 mg  10 mg Oral Daily Thurnell Lose, MD   10 mg at 05/30/22 1051   apixaban (ELIQUIS) tablet 5 mg  5 mg Oral BID Opyd, Ilene Qua, MD   5 mg at 05/30/22 0900   atorvastatin (LIPITOR) tablet 10 mg  10 mg Oral Daily Opyd, Ilene Qua, MD   10 mg at 05/30/22 0900   colchicine tablet 0.3 mg  0.3 mg Oral Daily Thurnell Lose, MD   0.3 mg at 05/30/22 [id number]   DULoxetine (CYMBALTA) DR capsule 40 mg  40 mg Oral BID Vianne Bulls, MD   40 mg at 05/30/22 [id number]   febuxostat (ULORIC) tablet 40 mg  40 mg Oral Daily Thurnell Lose, MD   40 mg at 05/30/22 1051   fentaNYL (SUBLIMAZE) injection 25 mcg  25 mcg Intravenous Q4H PRN Thurnell Lose, MD       guaiFENesin (ROBITUSSIN) 100 MG/5ML liquid 5 mL  5 mL Oral Q4H PRN Opyd, Ilene Qua, MD       HYDROcodone-acetaminophen (NORCO/VICODIN) 5-325 MG per tablet 1 tablet  1 tablet Oral Q6H PRN Thurnell Lose, MD       insulin aspart (novoLOG) injection 0-5 Units  0-5 Units Subcutaneous QHS Opyd, Ilene Qua, MD       insulin aspart (novoLOG) injection 0-9 Units  0-9 Units Subcutaneous TID WC Opyd, Ilene Qua, MD   2 Units at 05/30/22 0818   insulin aspart (novoLOG) injection 4 Units  4 Units Subcutaneous TID WC Thurnell Lose, MD       insulin glargine-yfgn (SEMGLEE) injection 14 Units  14 Units Subcutaneous BID Thurnell Lose, MD   14 Units at 05/30/22 1052   lactated  ringers infusion   Intravenous Continuous Thurnell Lose, MD 50 mL/hr at 05/30/22 1000 Infusion Verify at 05/30/22 1000   melatonin tablet 5 mg  5 mg Oral QHS PRN Howerter, Justin B, DO       methylPREDNISolone sodium succinate (SOLU-MEDROL) 125 mg/2 mL injection 60 mg  60 mg Intravenous Daily Thurnell Lose, MD   60 mg at 05/30/22 0814   metoprolol succinate (TOPROL-XL) 24 hr tablet 50 mg  50 mg Oral Daily Opyd, Ilene Qua, MD   50 mg at 05/30/22 0900   naloxone (NARCAN) injection 0.4 mg  0.4 mg Intravenous PRN Thurnell Lose, MD       ondansetron (ZOFRAN) tablet 4 mg  4 mg Oral Q6H PRN Opyd, Ilene Qua, MD       Or   ondansetron (ZOFRAN) injection 4 mg  4 mg Intravenous Q6H PRN Opyd, Ilene Qua, MD       Oral care mouth rinse  15 mL Mouth  Rinse PRN Thurnell Lose, MD       pantoprazole (PROTONIX) EC tablet 40 mg  40 mg Oral BID Opyd, Ilene Qua, MD   40 mg at 05/30/22 0900   QUEtiapine (SEROQUEL) tablet 50 mg  50 mg Oral Daily Thurnell Lose, MD       sodium bicarbonate tablet 650 mg  650 mg Oral BID Opyd, Ilene Qua, MD   650 mg at 05/30/22 0900     Discharge Medications: Please see discharge summary for a list of discharge medications.  Relevant Imaging Results:  Relevant Lab Results:   Additional Information [id number]  Milas Gain, LCSWA

## 2022-05-31 DIAGNOSIS — N179 Acute kidney failure, unspecified: Secondary | ICD-10-CM | POA: Diagnosis not present

## 2022-05-31 DIAGNOSIS — R4182 Altered mental status, unspecified: Secondary | ICD-10-CM | POA: Diagnosis not present

## 2022-05-31 DIAGNOSIS — N184 Chronic kidney disease, stage 4 (severe): Secondary | ICD-10-CM | POA: Diagnosis not present

## 2022-05-31 LAB — CBC WITH DIFFERENTIAL/PLATELET
Abs Immature Granulocytes: 0.15 10*3/uL — ABNORMAL HIGH (ref 0.00–0.07)
Basophils Absolute: 0 10*3/uL (ref 0.0–0.1)
Basophils Relative: 0 %
Eosinophils Absolute: 0 10*3/uL (ref 0.0–0.5)
Eosinophils Relative: 0 %
HCT: 25 % — ABNORMAL LOW (ref 36.0–46.0)
Hemoglobin: 8 g/dL — ABNORMAL LOW (ref 12.0–15.0)
Immature Granulocytes: 1 %
Lymphocytes Relative: 11 %
Lymphs Abs: 1.9 10*3/uL (ref 0.7–4.0)
MCH: 26.9 pg (ref 26.0–34.0)
MCHC: 32 g/dL (ref 30.0–36.0)
MCV: 84.2 fL (ref 80.0–100.0)
Monocytes Absolute: 1.2 10*3/uL — ABNORMAL HIGH (ref 0.1–1.0)
Monocytes Relative: 7 %
Neutro Abs: 14.3 10*3/uL — ABNORMAL HIGH (ref 1.7–7.7)
Neutrophils Relative %: 81 %
Platelets: 503 10*3/uL — ABNORMAL HIGH (ref 150–400)
RBC: 2.97 MIL/uL — ABNORMAL LOW (ref 3.87–5.11)
RDW: 19.9 % — ABNORMAL HIGH (ref 11.5–15.5)
WBC: 17.6 10*3/uL — ABNORMAL HIGH (ref 4.0–10.5)
nRBC: 0 % (ref 0.0–0.2)

## 2022-05-31 LAB — COMPREHENSIVE METABOLIC PANEL
ALT: 15 U/L (ref 0–44)
AST: 16 U/L (ref 15–41)
Albumin: 2.1 g/dL — ABNORMAL LOW (ref 3.5–5.0)
Alkaline Phosphatase: 79 U/L (ref 38–126)
Anion gap: 11 (ref 5–15)
BUN: 81 mg/dL — ABNORMAL HIGH (ref 6–20)
CO2: 25 mmol/L (ref 22–32)
Calcium: 9.1 mg/dL (ref 8.9–10.3)
Chloride: 100 mmol/L (ref 98–111)
Creatinine, Ser: 2.6 mg/dL — ABNORMAL HIGH (ref 0.44–1.00)
GFR, Estimated: 21 mL/min — ABNORMAL LOW (ref >60)
Glucose, Bld: 281 mg/dL — ABNORMAL HIGH (ref 70–99)
Potassium: 3.8 mmol/L (ref 3.5–5.1)
Sodium: 136 mmol/L (ref 135–145)
Total Bilirubin: 0.8 mg/dL (ref 0.3–1.2)
Total Protein: 7.2 g/dL (ref 6.5–8.1)

## 2022-05-31 LAB — GLUCOSE, CAPILLARY
Glucose-Capillary: 250 mg/dL — ABNORMAL HIGH (ref 70–99)
Glucose-Capillary: 263 mg/dL — ABNORMAL HIGH (ref 70–99)
Glucose-Capillary: 243 mg/dL — ABNORMAL HIGH (ref 70–99)

## 2022-05-31 LAB — TECHNOLOGIST SMEAR REVIEW: Plt Morphology: INCREASED

## 2022-05-31 LAB — PROCALCITONIN: Procalcitonin: <0.1 ng/mL

## 2022-05-31 LAB — BRAIN NATRIURETIC PEPTIDE: B Natriuretic Peptide: 160.3 pg/mL — ABNORMAL HIGH (ref 0.0–100.0)

## 2022-05-31 LAB — C-REACTIVE PROTEIN: CRP: 8.6 mg/dL — ABNORMAL HIGH (ref <1.0)

## 2022-05-31 LAB — UREA NITROGEN, URINE: Urea Nitrogen, Ur: 481 mg/dL

## 2022-05-31 LAB — MAGNESIUM: Magnesium: 1.9 mg/dL (ref 1.7–2.4)

## 2022-05-31 MED ORDER — INSULIN GLARGINE-YFGN 100 UNIT/ML ~~LOC~~ SOLN
16.0000 [IU] | Freq: Two times a day (BID) | SUBCUTANEOUS | Status: DC
  Administered 2022-05-31 – 2022-06-02 (×6): 16 [IU] via SUBCUTANEOUS
  Filled 2022-05-31 (×7): qty 0.16

## 2022-05-31 MED ORDER — METHYLPREDNISOLONE SODIUM SUCC 40 MG IJ SOLR
30.0000 mg | Freq: Every day | INTRAMUSCULAR | Status: DC
  Administered 2022-05-31 – 2022-06-01 (×2): 30 mg via INTRAVENOUS
  Filled 2022-05-31 (×2): qty 1

## 2022-05-31 MED ORDER — INSULIN ASPART 100 UNIT/ML IJ SOLN
6.0000 [IU] | Freq: Three times a day (TID) | INTRAMUSCULAR | Status: DC
  Administered 2022-05-31 – 2022-06-04 (×9): 6 [IU] via SUBCUTANEOUS

## 2022-05-31 MED ORDER — HYDROMORPHONE HCL 1 MG/ML IJ SOLN
1.0000 mg | Freq: Once | INTRAMUSCULAR | Status: AC
  Administered 2022-05-31: 1 mg via INTRAVENOUS
  Filled 2022-05-31: qty 1

## 2022-05-31 MED ORDER — METOPROLOL SUCCINATE ER 100 MG PO TB24
100.0000 mg | ORAL_TABLET | Freq: Every day | ORAL | Status: DC
  Administered 2022-05-31 – 2022-06-01 (×2): 100 mg via ORAL
  Filled 2022-05-31 (×2): qty 1

## 2022-05-31 NOTE — Progress Notes (Signed)
Patient is continuously calling out for pain medicine, dilaudid, specifically.  I gave her 1mg  PO at 1652 as patient stated her pain is a 10.  Patient did not look like she was in distress, just demanding her dilaudid.

## 2022-05-31 NOTE — Inpatient Diabetes Management (Addendum)
Inpatient Diabetes Program Recommendations  AACE/ADA: New Consensus Statement on Inpatient Glycemic Control (2015)  Target Ranges:  Prepandial:   less than 140 mg/dL      Peak postprandial:   less than 180 mg/dL (1-2 hours)      Critically ill patients:  140 - 180 mg/dL   Lab Results  Component Value Date   GLUCAP 263 (H) 05/31/2022   HGBA1C 10.1 (H) 03/21/2022    Review of Glycemic Control  Diabetes history: DM 2 Outpatient Diabetes medications: Semglee 15 units qhs, Humalog 7 units tid Current orders for Inpatient glycemic control:  Semglee 16 units bid Novolog 0-9 units tid + hs Novolog 6 units tid meal coverage  A1c 10.1% on 03/21/2022 Diabetes Coordinator spoke with pt on 3/5 Pt on steroids Solumedrol 30 mg Daily  Spoke with pt at bedside to follow up regarding glucose control outpatient whether it had improved since the last time we saw pt in the hospital. Pt saying she did not know and that she was currently in a lot of pain. Pt requesting regular soda to drink because she was allergic to artificial sweeteners in the diet versions of soda. I explained to pt that I would not provide a regular beverage for her and it would probably not be possible to request that from Nursing staff as we were trying to control her glucose levels in the hospital. Pt reports drinking regular beverages at home. Pt also had pineapple and a regular brownie on her bedside table. Will follow glucose trends while here.  Thanks,  Tama Headings RN, MSN, BC-ADM Inpatient Diabetes Coordinator Team Pager [phone] (8a-5p)

## 2022-05-31 NOTE — Plan of Care (Signed)

## 2022-05-31 NOTE — Progress Notes (Addendum)
PROGRESS NOTE                                                                                                                                                                                                             Patient Demographics:    Deborah [name], is a 57 y.o. female, DOB - [birth year], [id number]  Outpatient Primary MD for the patient is Cena Benton, MD    LOS - 2  Admit date - 05/28/2022    Chief Complaint  Patient presents with   Altered Mental Status       Brief Narrative (HPI from H&P)   57 y.o. female with medical history significant for insulin-dependent diabetes mellitus, hypertension, PAF on Eliquis, CKD stage IV, iron deficiency anemia, chronic pain, duodenal carcinoid tumor resected in 2021, and recent protracted admission for HHS which was complicated by AKI, transfusion dependent anemia, and difficult pain control, now presenting with altered mental status.   Patient was discharged to an SNF on 05/27/2022, was noted by SNF personnel to be altered all throughout the day, and she was eventually sent to the ED.  Patient has no acute complaints in the emergency department and does not understand why she was sent here.  She denies chest pain, nausea, vomiting, or diarrhea.  She denies fever or chills, she denies dysuria, hematuria, or flank pain.   In the ED her workup was suggestive of dehydration, AKI and metabolic encephalopathy, head CT was unremarkable, with ammonia level was stable, blood gas did not suggest any CO2 narcosis, she was had no focal deficits and was kept in the hospital for further treatment.  The workup suggested disseminated gout.   Subjective:   Patient in bed, appears comfortable, denies any headache, no fever, no chest pain or pressure, no shortness of breath , no abdominal pain. No new focal weakness, she feels a whole lot better.   Assessment  & Plan :   1.   Acute metabolic encephalopathy due to dehydration, AKI on CKD IV & extremely elevated uric acid levels causing disseminated gout -  - Her baseline creatinine is around 3.8, renal function has certainly worsened, she is being hydrated with IV fluids, urine electrolytes ordered, renal ultrasound also ordered, will monitor frequent bladder scans.  Head CT unremarkable, no focal deficits, stable ammonia levels, blood gas does not suggest  CO2 retention, will chest x-ray, stable UA, stable procalcitonin, stable EEG.    Uric acid levels were extremely elevated, this is suggestive of disseminated gout with disseminated inflammation with extremely elevated uric acid levels, elevated ferritin, CRP with stable calcitonin levels when accounted for renal dysfunction, causing dehydration, AKI and mental status change, she was hydrated, given challenge with IV steroids placed on colchicine and Uloric,  excellent improvement in mentation.  AKI improving as well with IV fluids, close to baseline, add Seroquel QHS, continue monitoring.   2. Acute encephalopathy  -See above   3. Lumbar spinal stenosis  - With b/l LE weakness, wheelchair-bound for months  - MRI during recent admission reviewed by Dr. Ronnald Ramp of neurosurgery who recommended PT and outpatient follow-up  - Continue PT and outpatient neurosurgery follow-up as planned    4. Anemia, transfusion dependent.  Negative FOBT recently. - Hgb dropped from 7.2 at the time of admission due to heme dilution, type screen 2 units of packed RBC transfusion, continue PPI, no signs of ongoing bleeding, stable anemia panel, pending peripheral smear.   5. PAF  - Continue Eliquis and metoprolol     6. Leukocytosis; thrombocytosis - chronic - Appears chronic and stable, no apparent infection, recent procalcitonin levels were low, and she has been advised to follow-up with hematology, most of the changes are chronic, check smear, procalcitonin and CRP.   7.  Hyponatremia   -Hydrate and monitor  8.  Insulin-dependent DM  - A1c was 10.1% in January 2024  - Check CBGs and continue long- and short-acting insulin, since she has been exposed to steroids dose adjusted for insulin on 05/30/2022, of note patient is eating 2 to 3 trays of meals on certain occasions will be cautious in any using insulin too much as oral intake is inconsistent.   CBG (last 3)  Recent Labs    05/30/22 2118 05/30/22 2152 05/31/22 0737  GLUCAP 379* 341* 250*    Lab Results  Component Value Date   HGBA1C 10.1 (H) 03/21/2022        Condition - Extremely Guarded  Family Communication  : Called mother Vicente Males [phone]  on 05/29/2022 at 10:49 AM, POA [place] [phone] Niece on 05/30/22  Code Status :  Full  Consults  :  None  PUD Prophylaxis : PPI   Procedures  :     CT head.  Nonacute.    EEG. Non acute  Renal US - Non acute      Disposition Plan  :    Status is: Inpt  DVT Prophylaxis  :    apixaban (ELIQUIS) tablet 5 mg     Lab Results  Component Value Date   PLT 503 (H) 05/31/2022    Diet :  Diet Order             Diet Carb Modified Fluid consistency: Thin; Room service appropriate? Yes  Diet effective now                    Inpatient Medications  Scheduled Meds:  amLODipine  10 mg Oral Daily   apixaban  5 mg Oral BID   atorvastatin  10 mg Oral Daily   colchicine  0.3 mg Oral Daily   DULoxetine  40 mg Oral BID   febuxostat  40 mg Oral Daily   ferrous sulfate  325 mg Oral BID WC   folic acid  1 mg Oral Daily   insulin aspart  0-5 Units Subcutaneous QHS  insulin aspart  0-9 Units Subcutaneous TID WC   insulin aspart  6 Units Subcutaneous TID WC   insulin glargine-yfgn  16 Units Subcutaneous BID   methylPREDNISolone (SOLU-MEDROL) injection  30 mg Intravenous Daily   metoprolol succinate  100 mg Oral Daily   mirtazapine  15 mg Oral QHS   pantoprazole  40 mg Oral BID   QUEtiapine  25 mg Oral Daily   sodium bicarbonate  650 mg Oral  BID   Continuous Infusions:   PRN Meds:.acetaminophen **OR** acetaminophen, albuterol, fentaNYL (SUBLIMAZE) injection, guaiFENesin, HYDROmorphone, melatonin, naLOXone (NARCAN)  injection, ondansetron **OR** ondansetron (ZOFRAN) IV, mouth rinse, traMADol  Antibiotics  :    Anti-infectives (From admission, onward)    None         Objective:   Vitals:   05/30/22 2327 05/31/22 0345 05/31/22 0700 05/31/22 0800  BP: (!) 146/79   (!) 144/95  Pulse: 96  (!) 104   Resp: 20  18 13   Temp: 98 F (36.7 C) (!) 97.3 F (36.3 C)    TempSrc: Axillary Oral    SpO2: 94%  99%   Weight:        Wt Readings from Last 3 Encounters:  05/29/22 132.4 kg  05/13/22 (!) 139.2 kg  03/20/22 115.2 kg     Intake/Output Summary (Last 24 hours) at 05/31/2022 0927 Last data filed at 05/30/2022 1800 Gross per 24 hour  Intake 1044.17 ml  Output 1500 ml  Net -455.83 ml     Physical Exam  Awake Alert, O x 3, No new F.N deficits, mildly anxious affect Speed.AT,PERRAL Supple Neck, No JVD,   Symmetrical Chest wall movement, Good air movement bilaterally, CTAB RRR,No Gallops, Rubs or new Murmurs,  +ve B.Sounds, Abd Soft, No tenderness,   No Cyanosis, Clubbing or edema        Data Review:    Recent Labs  Lab 05/26/22 0328 05/28/22 1819 05/28/22 1825 05/29/22 0728 05/29/22 1933 05/30/22 0357 05/31/22 0726  WBC 17.6* 17.0*  --  16.7*  --  12.5* 17.6*  HGB 7.6* 7.2* 8.2* 6.7* 8.0* 8.2* 8.0*  HCT 25.2* 24.4* 24.0* 21.5* 26.0* 27.3* 25.0*  PLT 705* 602*  --  576*  --  420* 503*  MCV 86.9 90.0  --  86.3  --  88.3 84.2  MCH 26.2 26.6  --  26.9  --  26.5 26.9  MCHC 30.2 29.5*  --  31.2  --  30.0 32.0  RDW 19.1* 19.8*  --  19.6*  --  20.2* 19.9*  LYMPHSABS  --  1.8  --   --   --  1.7 1.9  MONOABS  --  1.1*  --   --   --  0.8 1.2*  EOSABS  --  0.2  --   --   --  0.3 0.0  BASOSABS  --  0.1  --   --   --  0.1 0.0    Recent Labs  Lab 05/25/22 0310 05/26/22 0328 05/28/22 1819 05/28/22 1825  05/29/22 0728 05/29/22 1038 05/30/22 0357 05/31/22 0726  NA 130* 130* 128* 130* 131*  --  136 136  K 4.6 4.2 4.8 4.9 4.3  --  4.3 3.8  CL 94* 94* 93*  --  91*  --  103 100  CO2 22 23 20*  --  23  --  19* 25  ANIONGAP 14 13 15   --  17*  --  14 11  GLUCOSE 241* 228* 193*  --  215*  --  141* 281*  BUN 93* 96* 113*  --  112*  --  98* 81*  CREATININE 3.36* 3.87* 4.59*  --  4.44*  --  3.20* 2.60*  AST  --   --  22  --   --   --  22 16  ALT  --   --  17  --   --   --  15 15  ALKPHOS  --   --  91  --   --   --  74 79  BILITOT  --   --  0.9  --   --   --  1.2 0.8  ALBUMIN  --   --  2.4*  --   --   --  2.1* 2.1*  CRP  --   --   --   --   --  19.6* 14.2* 8.6*  PROCALCITON  --   --   --   --   --  0.28 0.16 <0.10  TSH  --   --   --   --   --  1.976  --   --   AMMONIA  --   --  19  --   --   --   --   --   BNP  --   --   --   --   --   --  54.7 160.3*  MG 2.0  --   --   --  2.2  --  2.1 1.9  CALCIUM 9.3 9.4 9.2  --  9.2  --  8.8* 9.1   Lab Results  Component Value Date   HGBA1C 10.1 (H) 03/21/2022        Radiology Reports US RENAL  Result Date: 05/29/2022 CLINICAL DATA:  Acute kidney injury EXAM: RENAL / URINARY TRACT ULTRASOUND COMPLETE COMPARISON:  03/19/2022 renal ultrasound FINDINGS: Right Kidney: Renal measurements: 11.5 x 5.0 x 5.3 cm = volume: 159 mL. Echogenicity within normal limits. No mass or hydronephrosis visualized. Left Kidney: Renal measurements: 10.5 x 6.1 x 4.8 cm = volume: 161 mL. Echogenicity within normal limits. Nonshadowing echogenic foci are noted in the mid left kidney, but no mass or hydronephrosis visualized. Bladder: Appears normal for degree of bladder distention. Bladder jets were not visualized. Other: Evaluation is somewhat limited by body habitus and overlying bowel gas. IMPRESSION: No significant sonographic abnormality of the kidneys. Electronically Signed   By: Merilyn Baba M.D.   On: 05/29/2022 12:01   EEG adult  Result Date: 05/29/2022 Lora Havens, MD     05/29/2022  2:06 PM Patient Name: ARLINDA COTTOM MRN: [id number] Epilepsy Attending: Lora Havens Referring Physician/Provider: Thurnell Lose, MD Date: 05/29/2022 Duration: 22.35 mins Patient history: 57yo F with ams. EEG to evaluate for seizure Level of alertness: Awake AEDs during EEG study: None Technical aspects: This EEG study was done with scalp electrodes positioned according to the 10-20 International system of electrode placement. Electrical activity was reviewed with band pass filter of 1-70Hz , sensitivity of 7 uV/mm, display speed of 23mm/sec with a 60Hz  notched filter applied as appropriate. EEG data were recorded continuously and digitally stored.  Video monitoring was available and reviewed as appropriate. Description: EEG showed continuous generalized 3 to 6 Hz theta-delta slowing. Generalized periodic discharges with triphasic morphology at  1Hz  were also noted. Hyperventilation and photic stimulation were not performed.   ABNORMALITY - Periodic discharges with triphasic morphology, generalized ( GPDs) - Continuous slow,  generalized IMPRESSION: This study is suggestive of moderate diffuse encephalopathy, nonspecific etiology but likely related to toxic-metabolic causes. No seizures or definite epileptiform discharges were seen throughout the recording. Lora Havens   DG Chest Port 1 View  Result Date: 05/29/2022 CLINICAL DATA:  57 year old female with shortness of breath. EXAM: PORTABLE CHEST 1 VIEW COMPARISON:  CT Chest, Abdomen, and Pelvis 05/10/2022, and earlier. FINDINGS: Portable AP upright view at 0553 hours. Lung volumes and mediastinal contours are within normal limits. Visualized tracheal air column is within normal limits. Allowing for portable technique the lungs are clear. No pneumothorax or pleural effusion. Paucity bowel gas in the visible abdomen. No acute osseous abnormality identified. IMPRESSION: Negative portable chest. Electronically Signed   By: Genevie Ann M.D.   On: 05/29/2022 06:16   CT HEAD WO CONTRAST  Result Date: 05/29/2022 CLINICAL DATA:  Altered mental status EXAM: CT HEAD WITHOUT CONTRAST TECHNIQUE: Contiguous axial images were obtained from the base of the skull through the vertex without intravenous contrast. RADIATION DOSE REDUCTION: This exam was performed according to the departmental dose-optimization program which includes automated exposure control, adjustment of the mA and/or kV according to patient size and/or use of iterative reconstruction technique. COMPARISON:  05/10/2022 FINDINGS: Brain: No evidence of acute infarction, hemorrhage, hydrocephalus, extra-axial collection or mass lesion/mass effect. Mild cortical atrophy suggested. Vascular: Mild intracranial arterial calcifications. Skull: Normal. Negative for fracture or focal lesion. Sinuses/Orbits: No acute finding. Other: Congenital nonunion of the posterior arch of C1. IMPRESSION: No acute intracranial abnormalities. No change since previous study. Electronically Signed   By: Lucienne Capers M.D.   On: 05/29/2022 02:36      Signature  -   Lala Lund M.D on 05/31/2022 at 9:27 AM   -  To page go to [email or web address]

## 2022-05-31 NOTE — TOC Progression Note (Signed)
Transition of Care [facility]) - Progression Note    Patient Details  Name: Deborah [name] MRN: [id number] Date of Birth: 02-13-1966  Transition of Care [facility]) CM/SW [place], LCSW Phone Number: 05/31/2022, 4:23 PM  Clinical Narrative:    CSW spoke with patient's niece and answered questions regarding alternative facility options. She shared that she lives in [place]. She will call CSW right back to further discuss.    Expected Discharge Plan: [place] Barriers to Discharge: Continued Medical Work up  Expected Discharge Plan and Services In-house Referral: Clinical Social Work   Post Acute Care Choice: [place] Living arrangements for the past 2 months:  (from [facility] short term)                                       Social Determinants of Health (SDOH) Interventions SDOH Screenings   Food Insecurity: Food Insecurity Present (05/29/2022)  Housing: High Risk (05/29/2022)  Transportation Needs: Unmet Transportation Needs (05/29/2022)  Utilities: At Risk (05/29/2022)  Depression (PHQ2-9): Medium Risk (09/16/2021)  Tobacco Use: Low Risk  (05/29/2022)    Readmission Risk Interventions     No data to display

## 2022-05-31 NOTE — Plan of Care (Signed)

## 2022-06-01 DIAGNOSIS — N184 Chronic kidney disease, stage 4 (severe): Secondary | ICD-10-CM | POA: Diagnosis not present

## 2022-06-01 DIAGNOSIS — N179 Acute kidney failure, unspecified: Secondary | ICD-10-CM | POA: Diagnosis not present

## 2022-06-01 DIAGNOSIS — R410 Disorientation, unspecified: Secondary | ICD-10-CM | POA: Diagnosis not present

## 2022-06-01 DIAGNOSIS — R4182 Altered mental status, unspecified: Secondary | ICD-10-CM | POA: Diagnosis not present

## 2022-06-01 LAB — COMPREHENSIVE METABOLIC PANEL
ALT: 16 U/L (ref 0–44)
AST: 14 U/L — ABNORMAL LOW (ref 15–41)
Albumin: 2.4 g/dL — ABNORMAL LOW (ref 3.5–5.0)
Alkaline Phosphatase: 78 U/L (ref 38–126)
Anion gap: 13 (ref 5–15)
BUN: 68 mg/dL — ABNORMAL HIGH (ref 6–20)
CO2: 25 mmol/L (ref 22–32)
Calcium: 9.4 mg/dL (ref 8.9–10.3)
Chloride: 101 mmol/L (ref 98–111)
Creatinine, Ser: 2.13 mg/dL — ABNORMAL HIGH (ref 0.44–1.00)
GFR, Estimated: 26 mL/min — ABNORMAL LOW (ref >60)
Glucose, Bld: 236 mg/dL — ABNORMAL HIGH (ref 70–99)
Potassium: 4.1 mmol/L (ref 3.5–5.1)
Sodium: 139 mmol/L (ref 135–145)
Total Bilirubin: 0.6 mg/dL (ref 0.3–1.2)
Total Protein: 8.2 g/dL — ABNORMAL HIGH (ref 6.5–8.1)

## 2022-06-01 LAB — CBC WITH DIFFERENTIAL/PLATELET
Abs Immature Granulocytes: 0.1 10*3/uL — ABNORMAL HIGH (ref 0.00–0.07)
Basophils Absolute: 0 10*3/uL (ref 0.0–0.1)
Basophils Relative: 0 %
Eosinophils Absolute: 0 10*3/uL (ref 0.0–0.5)
Eosinophils Relative: 0 %
HCT: 30.8 % — ABNORMAL LOW (ref 36.0–46.0)
Hemoglobin: 9.6 g/dL — ABNORMAL LOW (ref 12.0–15.0)
Immature Granulocytes: 1 %
Lymphocytes Relative: 10 %
Lymphs Abs: 1.6 10*3/uL (ref 0.7–4.0)
MCH: 26.4 pg (ref 26.0–34.0)
MCHC: 31.2 g/dL (ref 30.0–36.0)
MCV: 84.8 fL (ref 80.0–100.0)
Monocytes Absolute: 0.6 10*3/uL (ref 0.1–1.0)
Monocytes Relative: 4 %
Neutro Abs: 14.3 10*3/uL — ABNORMAL HIGH (ref 1.7–7.7)
Neutrophils Relative %: 85 %
Platelets: 562 10*3/uL — ABNORMAL HIGH (ref 150–400)
RBC: 3.63 MIL/uL — ABNORMAL LOW (ref 3.87–5.11)
RDW: 19.5 % — ABNORMAL HIGH (ref 11.5–15.5)
WBC: 16.7 10*3/uL — ABNORMAL HIGH (ref 4.0–10.5)
nRBC: 0 % (ref 0.0–0.2)

## 2022-06-01 LAB — GLUCOSE, CAPILLARY
Glucose-Capillary: 237 mg/dL — ABNORMAL HIGH (ref 70–99)
Glucose-Capillary: 122 mg/dL — ABNORMAL HIGH (ref 70–99)

## 2022-06-01 LAB — URIC ACID: Uric Acid, Serum: 13.5 mg/dL — ABNORMAL HIGH (ref 2.5–7.1)

## 2022-06-01 LAB — BRAIN NATRIURETIC PEPTIDE: B Natriuretic Peptide: 534.9 pg/mL — ABNORMAL HIGH (ref 0.0–100.0)

## 2022-06-01 LAB — MAGNESIUM: Magnesium: 1.7 mg/dL (ref 1.7–2.4)

## 2022-06-01 LAB — C-REACTIVE PROTEIN: CRP: 6.1 mg/dL — ABNORMAL HIGH (ref <1.0)

## 2022-06-01 MED ORDER — HYDROMORPHONE HCL 2 MG PO TABS
2.0000 mg | ORAL_TABLET | Freq: Four times a day (QID) | ORAL | Status: DC | PRN
  Administered 2022-06-01 – 2022-06-04 (×10): 2 mg via ORAL
  Filled 2022-06-01 (×11): qty 1

## 2022-06-01 MED ORDER — METHYLPREDNISOLONE SODIUM SUCC 40 MG IJ SOLR: 20.0000 mg | Freq: Every day | INTRAMUSCULAR | Status: DC

## 2022-06-01 NOTE — Progress Notes (Addendum)
PROGRESS NOTE                                                                                                                                                                                                             Patient Demographics:    Deborah [name], is a 57 y.o. female, DOB - 03-10-1965, [id number]  Outpatient Primary MD for the patient is Cena Benton, MD    LOS - 3  Admit date - 05/28/2022    Chief Complaint  Patient presents with   Altered Mental Status       Brief Narrative (HPI from H&P)   56 y.o. female with medical history significant for insulin-dependent diabetes mellitus, hypertension, PAF on Eliquis, CKD stage IV, iron deficiency anemia, chronic pain, duodenal carcinoid tumor resected in 2021, and recent protracted admission for HHS which was complicated by AKI, transfusion dependent anemia, and difficult pain control, now presenting with altered mental status.   Patient was discharged to an SNF on 05/27/2022, was noted by SNF personnel to be altered all throughout the day, and she was eventually sent to the ED.  Patient has no acute complaints in the emergency department and does not understand why she was sent here.  She denies chest pain, nausea, vomiting, or diarrhea.  She denies fever or chills, she denies dysuria, hematuria, or flank pain.   In the ED her workup was suggestive of dehydration, AKI and metabolic encephalopathy, head CT was unremarkable, with ammonia level was stable, blood gas did not suggest any CO2 narcosis, she was had no focal deficits and was kept in the hospital for further treatment.  The workup suggested disseminated gout.   Subjective:   Patient in bed appears to be in mild narcotic withdrawal, denies any headache, diffuse pain all over, no chest pain, no focal weakness, no shortness of breath.   Assessment  & Plan :   1.  Acute metabolic encephalopathy due  to dehydration, AKI on CKD IV & extremely elevated uric acid levels causing disseminated gout -  - Her baseline creatinine is around 3.8, renal function has certainly worsened, she is being hydrated with IV fluids, urine electrolytes ordered, renal ultrasound also ordered, will monitor frequent bladder scans.  Head CT unremarkable, no focal deficits, stable ammonia levels, blood gas does not suggest CO2 retention, will chest x-ray, stable  UA, stable procalcitonin, stable EEG.    Uric acid levels were extremely elevated, this is suggestive of disseminated gout with disseminated inflammation with extremely elevated uric acid levels, elevated ferritin, CRP with stable calcitonin levels when accounted for renal dysfunction, causing dehydration, AKI and mental status change, she was hydrated, given challenge with IV steroids placed on colchicine and Uloric,  excellent improvement in mentation.  AKI improving as well with IV fluids, close to baseline, add Seroquel QHS, continue monitoring.   2. Acute encephalopathy with some nonspecific bizarre behavior from time to time -With above treatment encephalopathy has improved however intermittently patient has some bizarre behavior where she gets extremely anxious, claims that people are trying to hurt her especially at the nursing home, claims that there is a man that comes to her room and beat her up at night at the nursing home.  Niece also concerned that her behavior at times this is consistent and wants psych to evaluate.  Will request psych input.   3. Lumbar spinal stenosis  - With b/l LE weakness, wheelchair-bound for months  - MRI during recent admission reviewed by Dr. Ronnald Ramp of neurosurgery who recommended PT and outpatient follow-up  - Continue PT and outpatient neurosurgery follow-up as planned    4. Anemia, transfusion dependent.  Negative FOBT recently. - Hgb dropped from 7.2 at the time of admission due to heme dilution, type screen 2 units of  packed RBC transfusion, continue PPI, no signs of ongoing bleeding, stable anemia panel, pending peripheral smear.   5. PAF  - Continue Eliquis and metoprolol     6. Leukocytosis; thrombocytosis - chronic - Appears chronic and stable, no apparent infection, recent procalcitonin levels were low, and she has been advised to follow-up with hematology, most of the changes are chronic, check smear, procalcitonin and CRP.   7.  Hyponatremia  -Hydrate and monitor  8.  Chronic pain and chronic narcotic use.  On high doses of chronic Dilaudid, dose adjusted for better control on 06/01/2022.  Will request PCP to gradually taper off high-dose narcotic use for chronic pain.   9.  Insulin-dependent DM  - A1c was 10.1% in January 2024  - Check CBGs and continue long- and short-acting insulin, since she has been exposed to steroids dose adjusted for insulin on 05/30/2022, of note patient is eating 2 to 3 trays of meals on certain occasions will be cautious in any using insulin too much as oral intake is inconsistent.   CBG (last 3)  Recent Labs    05/31/22 0737 05/31/22 1152 05/31/22 2125  GLUCAP 250* 263* 243*    Lab Results  Component Value Date   HGBA1C 10.1 (H) 03/21/2022        Condition - Extremely Guarded  Family Communication  : Called mother Vicente Males [phone]  on 05/29/2022 at 10:49 AM, POA [place] [phone] Niece on 05/30/22  Code Status :  Full  Consults  : Psych consult requested   PUD Prophylaxis : PPI   Procedures  :     CT head.  Nonacute.    EEG. Non acute  Renal US - Non acute      Disposition Plan  :    Status is: Inpt  DVT Prophylaxis  :    apixaban (ELIQUIS) tablet 5 mg     Lab Results  Component Value Date   PLT 562 (H) 06/01/2022    Diet :  Diet Order  Diet Carb Modified Fluid consistency: Thin; Room service appropriate? Yes  Diet effective now                    Inpatient Medications  Scheduled Meds:  amLODipine  10 mg  Oral Daily   apixaban  5 mg Oral BID   atorvastatin  10 mg Oral Daily   colchicine  0.3 mg Oral Daily   DULoxetine  40 mg Oral BID   febuxostat  40 mg Oral Daily   ferrous sulfate  325 mg Oral BID WC   folic acid  1 mg Oral Daily   insulin aspart  0-5 Units Subcutaneous QHS   insulin aspart  0-9 Units Subcutaneous TID WC   insulin aspart  6 Units Subcutaneous TID WC   insulin glargine-yfgn  16 Units Subcutaneous BID   methylPREDNISolone (SOLU-MEDROL) injection  30 mg Intravenous Daily   metoprolol succinate  100 mg Oral Daily   mirtazapine  15 mg Oral QHS   pantoprazole  40 mg Oral BID   QUEtiapine  25 mg Oral Daily   sodium bicarbonate  650 mg Oral BID   Continuous Infusions:   PRN Meds:.acetaminophen **OR** acetaminophen, albuterol, fentaNYL (SUBLIMAZE) injection, guaiFENesin, HYDROmorphone, melatonin, naLOXone (NARCAN)  injection, ondansetron **OR** ondansetron (ZOFRAN) IV, mouth rinse, traMADol  Antibiotics  :    Anti-infectives (From admission, onward)    None         Objective:   Vitals:   06/01/22 0100 06/01/22 0356 06/01/22 0500 06/01/22 0803  BP: 137/77 104/71  (!) 164/101  Pulse: 98 98  (!) 106  Resp: 20 20  20   Temp:  97.7 F (36.5 C)  97.8 F (36.6 C)  TempSrc:  Oral  Oral  SpO2:    97%  Weight:   132 kg     Wt Readings from Last 3 Encounters:  06/01/22 132 kg  05/13/22 (!) 139.2 kg  03/20/22 115.2 kg     Intake/Output Summary (Last 24 hours) at 06/01/2022 1000 Last data filed at 05/31/2022 2300 Gross per 24 hour  Intake 120 ml  Output 500 ml  Net -380 ml     Physical Exam  Awake Alert, O x 3, No new F.N deficits, mildly anxious affect [place].AT,PERRAL Supple Neck, No JVD,   Symmetrical Chest wall movement, Good air movement bilaterally, CTAB RRR,No Gallops, Rubs or new Murmurs,  +ve B.Sounds, Abd Soft, No tenderness,   No Cyanosis, Clubbing or edema        Data Review:    Recent Labs  Lab 05/28/22 1819 05/28/22 1825  05/29/22 0728 05/29/22 1933 05/30/22 0357 05/31/22 0726 06/01/22 0522  WBC 17.0*  --  16.7*  --  12.5* 17.6* 16.7*  HGB 7.2*   < > 6.7* 8.0* 8.2* 8.0* 9.6*  HCT 24.4*   < > 21.5* 26.0* 27.3* 25.0* 30.8*  PLT 602*  --  576*  --  420* 503* 562*  MCV 90.0  --  86.3  --  88.3 84.2 84.8  MCH 26.6  --  26.9  --  26.5 26.9 26.4  MCHC 29.5*  --  31.2  --  30.0 32.0 31.2  RDW 19.8*  --  19.6*  --  20.2* 19.9* 19.5*  LYMPHSABS 1.8  --   --   --  1.7 1.9 1.6  MONOABS 1.1*  --   --   --  0.8 1.2* 0.6  EOSABS 0.2  --   --   --  0.3 0.0  0.0  BASOSABS 0.1  --   --   --  0.1 0.0 0.0   < > = values in this interval not displayed.    Recent Labs  Lab 05/28/22 1819 05/28/22 1825 05/29/22 0728 05/29/22 1038 05/30/22 0357 05/31/22 0726 06/01/22 0522  NA 128* 130* 131*  --  136 136 139  K 4.8 4.9 4.3  --  4.3 3.8 4.1  CL 93*  --  91*  --  103 100 101  CO2 20*  --  23  --  19* 25 25  ANIONGAP 15  --  17*  --  14 11 13   GLUCOSE 193*  --  215*  --  141* 281* 236*  BUN 113*  --  112*  --  98* 81* 68*  CREATININE 4.59*  --  4.44*  --  3.20* 2.60* 2.13*  AST 22  --   --   --  22 16 14*  ALT 17  --   --   --  15 15 16   ALKPHOS 91  --   --   --  74 79 78  BILITOT 0.9  --   --   --  1.2 0.8 0.6  ALBUMIN 2.4*  --   --   --  2.1* 2.1* 2.4*  CRP  --   --   --  19.6* 14.2* 8.6* 6.1*  PROCALCITON  --   --   --  0.28 0.16 <0.10  --   TSH  --   --   --  1.976  --   --   --   AMMONIA 19  --   --   --   --   --   --   BNP  --   --   --   --  54.7 160.3* 534.9*  MG  --   --  2.2  --  2.1 1.9 1.7  CALCIUM 9.2  --  9.2  --  8.8* 9.1 9.4   Lab Results  Component Value Date   HGBA1C 10.1 (H) 03/21/2022        Radiology Reports US RENAL  Result Date: 05/29/2022 CLINICAL DATA:  Acute kidney injury EXAM: RENAL / URINARY TRACT ULTRASOUND COMPLETE COMPARISON:  03/19/2022 renal ultrasound FINDINGS: Right Kidney: Renal measurements: 11.5 x 5.0 x 5.3 cm = volume: 159 mL. Echogenicity within normal limits. No  mass or hydronephrosis visualized. Left Kidney: Renal measurements: 10.5 x 6.1 x 4.8 cm = volume: 161 mL. Echogenicity within normal limits. Nonshadowing echogenic foci are noted in the mid left kidney, but no mass or hydronephrosis visualized. Bladder: Appears normal for degree of bladder distention. Bladder jets were not visualized. Other: Evaluation is somewhat limited by body habitus and overlying bowel gas. IMPRESSION: No significant sonographic abnormality of the kidneys. Electronically Signed   By: Merilyn Baba M.D.   On: 05/29/2022 12:01   EEG adult  Result Date: 05/29/2022 Lora Havens, MD     05/29/2022  2:06 PM Patient Name: MIKAHLA CORLESS MRN: [id number] Epilepsy Attending: Lora Havens Referring Physician/Provider: Thurnell Lose, MD Date: 05/29/2022 Duration: 22.35 mins Patient history: 57yo F with ams. EEG to evaluate for seizure Level of alertness: Awake AEDs during EEG study: None Technical aspects: This EEG study was done with scalp electrodes positioned according to the 10-20 International system of electrode placement. Electrical activity was reviewed with band pass filter of 1-70Hz , sensitivity of 7 uV/mm, display speed of 39mm/sec with a 60Hz  notched  filter applied as appropriate. EEG data were recorded continuously and digitally stored.  Video monitoring was available and reviewed as appropriate. Description: EEG showed continuous generalized 3 to 6 Hz theta-delta slowing. Generalized periodic discharges with triphasic morphology at  1Hz  were also noted. Hyperventilation and photic stimulation were not performed.   ABNORMALITY - Periodic discharges with triphasic morphology, generalized ( GPDs) - Continuous slow, generalized IMPRESSION: This study is suggestive of moderate diffuse encephalopathy, nonspecific etiology but likely related to toxic-metabolic causes. No seizures or definite epileptiform discharges were seen throughout the recording. Lora Havens   DG Chest  Port 1 View  Result Date: 05/29/2022 CLINICAL DATA:  57 year old female with shortness of breath. EXAM: PORTABLE CHEST 1 VIEW COMPARISON:  CT Chest, Abdomen, and Pelvis 05/10/2022, and earlier. FINDINGS: Portable AP upright view at 0553 hours. Lung volumes and mediastinal contours are within normal limits. Visualized tracheal air column is within normal limits. Allowing for portable technique the lungs are clear. No pneumothorax or pleural effusion. Paucity bowel gas in the visible abdomen. No acute osseous abnormality identified. IMPRESSION: Negative portable chest. Electronically Signed   By: Genevie Ann M.D.   On: 05/29/2022 06:16   CT HEAD WO CONTRAST  Result Date: 05/29/2022 CLINICAL DATA:  Altered mental status EXAM: CT HEAD WITHOUT CONTRAST TECHNIQUE: Contiguous axial images were obtained from the base of the skull through the vertex without intravenous contrast. RADIATION DOSE REDUCTION: This exam was performed according to the departmental dose-optimization program which includes automated exposure control, adjustment of the mA and/or kV according to patient size and/or use of iterative reconstruction technique. COMPARISON:  05/10/2022 FINDINGS: Brain: No evidence of acute infarction, hemorrhage, hydrocephalus, extra-axial collection or mass lesion/mass effect. Mild cortical atrophy suggested. Vascular: Mild intracranial arterial calcifications. Skull: Normal. Negative for fracture or focal lesion. Sinuses/Orbits: No acute finding. Other: Congenital nonunion of the posterior arch of C1. IMPRESSION: No acute intracranial abnormalities. No change since previous study. Electronically Signed   By: Lucienne Capers M.D.   On: 05/29/2022 02:36      Signature  -   Lala Lund M.D on 06/01/2022 at 10:00 AM   -  To page go to [email or web address]

## 2022-06-01 NOTE — TOC Progression Note (Addendum)
Transition of Care [facility]) - Progression Note    Patient Details  Name: SHAWNALEE PENUELAS MRN: [id number] Date of Birth: 10-14-1965  Transition of Care [facility]) CM/SW [place], LCSW Phone Number: 06/01/2022, 10:54 AM  Clinical Narrative:    10:54am-CSW spoke with patient's Niece and discussed SNF plan as she is hoping to get patient closer to her in [place]. CSW answered questions and requested [facility] liaison contact her as well to go over process. CSW requested therapy see patient today for insurance authorization process.   4pm-CSW spoke with patient's Niece who confirmed plan for [facility]. CSW initiated insurance approval process, Ref# [id number].    Expected Discharge Plan: [place] Barriers to Discharge: Continued Medical Work up  Expected Discharge Plan and Services In-house Referral: Clinical Social Work   Post Acute Care Choice: [place] Living arrangements for the past 2 months:  (from [facility] short term)                                       Social Determinants of Health (SDOH) Interventions SDOH Screenings   Food Insecurity: Food Insecurity Present (05/29/2022)  Housing: High Risk (05/29/2022)  Transportation Needs: Unmet Transportation Needs (05/29/2022)  Utilities: At Risk (05/29/2022)  Depression (PHQ2-9): Medium Risk (09/16/2021)  Tobacco Use: Low Risk  (05/29/2022)    Readmission Risk Interventions     No data to display

## 2022-06-01 NOTE — Care Management Important Message (Signed)
Important Message  Patient Details  Name: Deborah [name] MRN: [id number] Date of Birth: Jul 07, 1965   Medicare Important Message Given:  Yes     Orbie Pyo 06/01/2022, 2:25 PM

## 2022-06-01 NOTE — Plan of Care (Signed)
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
## 2022-06-02 ENCOUNTER — Inpatient Hospital Stay (HOSPITAL_COMMUNITY): Payer: 59

## 2022-06-02 DIAGNOSIS — R4182 Altered mental status, unspecified: Secondary | ICD-10-CM | POA: Diagnosis not present

## 2022-06-02 DIAGNOSIS — R410 Disorientation, unspecified: Secondary | ICD-10-CM

## 2022-06-02 DIAGNOSIS — N184 Chronic kidney disease, stage 4 (severe): Secondary | ICD-10-CM | POA: Diagnosis not present

## 2022-06-02 DIAGNOSIS — N179 Acute kidney failure, unspecified: Secondary | ICD-10-CM | POA: Diagnosis not present

## 2022-06-02 LAB — CBC WITH DIFFERENTIAL/PLATELET
Abs Immature Granulocytes: 0.13 10*3/uL — ABNORMAL HIGH (ref 0.00–0.07)
Basophils Absolute: 0 10*3/uL (ref 0.0–0.1)
Basophils Relative: 0 %
Eosinophils Absolute: 0.1 10*3/uL (ref 0.0–0.5)
Eosinophils Relative: 0 %
HCT: 32 % — ABNORMAL LOW (ref 36.0–46.0)
Hemoglobin: 9.7 g/dL — ABNORMAL LOW (ref 12.0–15.0)
Immature Granulocytes: 1 %
Lymphocytes Relative: 9 %
Lymphs Abs: 1.9 10*3/uL (ref 0.7–4.0)
MCH: 26.4 pg (ref 26.0–34.0)
MCHC: 30.3 g/dL (ref 30.0–36.0)
MCV: 87 fL (ref 80.0–100.0)
Monocytes Absolute: 1 10*3/uL (ref 0.1–1.0)
Monocytes Relative: 5 %
Neutro Abs: 17.1 10*3/uL — ABNORMAL HIGH (ref 1.7–7.7)
Neutrophils Relative %: 85 %
Platelets: 559 10*3/uL — ABNORMAL HIGH (ref 150–400)
RBC: 3.68 MIL/uL — ABNORMAL LOW (ref 3.87–5.11)
RDW: 19.4 % — ABNORMAL HIGH (ref 11.5–15.5)
WBC: 20.2 10*3/uL — ABNORMAL HIGH (ref 4.0–10.5)
nRBC: 0 % (ref 0.0–0.2)

## 2022-06-02 LAB — BRAIN NATRIURETIC PEPTIDE: B Natriuretic Peptide: 628.4 pg/mL — ABNORMAL HIGH (ref 0.0–100.0)

## 2022-06-02 LAB — COMPREHENSIVE METABOLIC PANEL
ALT: 19 U/L (ref 0–44)
AST: 24 U/L (ref 15–41)
Albumin: 2.5 g/dL — ABNORMAL LOW (ref 3.5–5.0)
Alkaline Phosphatase: 86 U/L (ref 38–126)
Anion gap: 15 (ref 5–15)
BUN: 49 mg/dL — ABNORMAL HIGH (ref 6–20)
CO2: 27 mmol/L (ref 22–32)
Calcium: 9.4 mg/dL (ref 8.9–10.3)
Chloride: 102 mmol/L (ref 98–111)
Creatinine, Ser: 1.92 mg/dL — ABNORMAL HIGH (ref 0.44–1.00)
GFR, Estimated: 30 mL/min — ABNORMAL LOW (ref >60)
Glucose, Bld: 187 mg/dL — ABNORMAL HIGH (ref 70–99)
Potassium: 3.8 mmol/L (ref 3.5–5.1)
Sodium: 144 mmol/L (ref 135–145)
Total Bilirubin: 0.6 mg/dL (ref 0.3–1.2)
Total Protein: 8 g/dL (ref 6.5–8.1)

## 2022-06-02 LAB — GLUCOSE, CAPILLARY
Glucose-Capillary: 186 mg/dL — ABNORMAL HIGH (ref 70–99)
Glucose-Capillary: 205 mg/dL — ABNORMAL HIGH (ref 70–99)
Glucose-Capillary: 343 mg/dL — ABNORMAL HIGH (ref 70–99)
Glucose-Capillary: 287 mg/dL — ABNORMAL HIGH (ref 70–99)

## 2022-06-02 LAB — MAGNESIUM: Magnesium: 1.5 mg/dL — ABNORMAL LOW (ref 1.7–2.4)

## 2022-06-02 LAB — C-REACTIVE PROTEIN: CRP: 2.7 mg/dL — ABNORMAL HIGH (ref <1.0)

## 2022-06-02 MED ORDER — CARVEDILOL 12.5 MG PO TABS
12.5000 mg | ORAL_TABLET | Freq: Two times a day (BID) | ORAL | Status: DC
  Administered 2022-06-02 – 2022-06-04 (×5): 12.5 mg via ORAL
  Filled 2022-06-02 (×5): qty 1

## 2022-06-02 MED ORDER — METOPROLOL TARTRATE 5 MG/5ML IV SOLN: 5.0000 mg | Freq: Three times a day (TID) | INTRAVENOUS | Status: DC | PRN

## 2022-06-02 MED ORDER — DONEPEZIL HCL 5 MG PO TABS
5.0000 mg | ORAL_TABLET | Freq: Every day | ORAL | Status: DC
  Administered 2022-06-02 – 2022-06-03 (×2): 5 mg via ORAL
  Filled 2022-06-02 (×2): qty 1

## 2022-06-02 MED ORDER — HYDRALAZINE HCL 20 MG/ML IJ SOLN
10.0000 mg | Freq: Four times a day (QID) | INTRAMUSCULAR | Status: DC | PRN
  Administered 2022-06-04: 10 mg via INTRAVENOUS
  Filled 2022-06-02: qty 1

## 2022-06-02 MED ORDER — METHYLPREDNISOLONE SODIUM SUCC 40 MG IJ SOLR
20.0000 mg | Freq: Once | INTRAMUSCULAR | Status: AC
  Administered 2022-06-02: 20 mg via INTRAVENOUS

## 2022-06-02 MED ORDER — METHYLPREDNISOLONE SODIUM SUCC 40 MG IJ SOLR
40.0000 mg | Freq: Every day | INTRAMUSCULAR | Status: DC
  Administered 2022-06-02 – 2022-06-04 (×3): 40 mg via INTRAVENOUS
  Filled 2022-06-02 (×3): qty 1

## 2022-06-02 NOTE — Progress Notes (Signed)
Occupational Therapy Treatment Patient Details Name: Deborah [name] MRN: [id number] DOB: December 27, 1965 Today's Date: 06/02/2022   History of present illness 57 y/o F admitted to [facility] on 3/22 for AMS. Head CT negative. Pt with recent admission 3/4-3/21 where she was discharged to a SNF. PMHx: DM, CHF, CVA, duodenal carcinoid tumor resected in 2021, AKI, anemia, chronic pain, CKD, PAF on eliquis, lumbar spinal stenosis   OT comments  Pt is making good progress towards their acute OT goals. Upon arrival pt was reported need to have a BM, declined BSC transfer and dependently placed on bed pan - pt halos dependent for pericare at bed level. Pt's mom present and providing gentle encouragement. Pt agreeable to standing attempts with use of the back of the recliner for BUE support and to aide in fear of falling. Overall she stood 3x with light min A and stood fro ~1 minute each time. OT to continue to follow acutely to facilitate progress towards established goals. Pt will continue to benefit from skilled inpatient follow up therapy, <3 hours/day.     Recommendations for follow up therapy are one component of a multi-disciplinary discharge planning process, led by the attending physician.  Recommendations may be updated based on patient status, additional functional criteria and insurance authorization.    Assistance Recommended at Discharge Frequent or constant Supervision/Assistance  Patient can return home with the following  Assist for transportation;Assistance with cooking/housework;A lot of help with walking and/or transfers;A lot of help with bathing/dressing/bathroom   Equipment Recommendations  BSC/3in1    Recommendations for Other Services      Precautions / Restrictions Precautions Precautions: Fall Restrictions Weight Bearing Restrictions: No       Mobility Bed Mobility Overal bed mobility: Needs Assistance Bed Mobility: Rolling, Supine to Sit, Sit to Supine Rolling: Min  guard   Supine to sit: Min guard Sit to supine: Min guard   General bed mobility comments: no physical assist needed    Transfers Overall transfer level: Needs assistance Equipment used:  (back of recliner) Transfers: Sit to/from Stand Sit to Stand: Min assist           General transfer comment: min A to stand EOB 3x using back of recliner for BUE support and to aide in pt's fear of falling     Balance Overall balance assessment: Needs assistance Sitting-balance support: Feet supported Sitting balance-Leahy Scale: Fair     Standing balance support: Bilateral upper extremity supported, During functional activity Standing balance-Leahy Scale: Poor                             ADL either performed or assessed with clinical judgement   ADL Overall ADL's : Needs assistance/impaired                         Toilet Transfer: Total assistance Toilet Transfer Details (indicate cue type and reason): dependently placed on bed pan, pt declined BSC attempt Toileting- Clothing Manipulation and Hygiene: Total assistance;Bed level Toileting - Clothing Manipulation Details (indicate cue type and reason): pt declined participation in rear peri care     Functional mobility during ADLs: Minimal assistance General ADL Comments: toileting at bed level - pt then agreeable for standing attempts at [facility]    Extremity/Trunk Assessment Upper Extremity Assessment Upper Extremity Assessment: Generalized weakness LUE Deficits / Details: reports L shoulder pain, WFL for tasks this date   Lower Extremity Assessment Lower  Extremity Assessment: Defer to PT evaluation        Vision   Vision Assessment?: No apparent visual deficits   Perception Perception Perception: Not tested   Praxis Praxis Praxis: Not tested    Cognition Arousal/Alertness: Awake/alert Behavior During Therapy: Anxious, Flat affect Overall Cognitive Status: Impaired/Different from baseline                                  General Comments: anxious but agreeable this date. Pt's mom present and providing gentle encouragement        Exercises Other Exercises Other Exercises: standing 3x    Shoulder Instructions       General Comments VSS, pt had BM    Pertinent Vitals/ Pain       Pain Assessment Pain Assessment: Faces Faces Pain Scale: Hurts a little bit Pain Location: RLE and LUE Pain Descriptors / Indicators: Discomfort, Constant Pain Intervention(s): Limited activity within patient's tolerance, Monitored during session  Home Living                                          Prior Functioning/Environment              Frequency  Min 2X/week        Progress Toward Goals  OT Goals(current goals can now be found in the care plan section)  Progress towards OT goals: Progressing toward goals  Acute Rehab OT Goals Patient Stated Goal: to get stronger OT Goal Formulation: With patient Time For Goal Achievement: 06/13/22 Potential to Achieve Goals: Fair ADL Goals Pt Will Perform Grooming: with supervision;sitting Pt Will Perform Upper Body Dressing: with min guard assist;sitting Pt Will Perform Lower Body Dressing: with min guard assist;sitting/lateral leans;sit to/from stand Pt Will Transfer to Toilet: with min assist;squat pivot transfer;stand pivot transfer;bedside commode Pt Will Perform Toileting - Clothing Manipulation and hygiene: with modified independence Pt/caregiver will Perform Home Exercise Program: Increased ROM;Increased strength;Both right and left upper extremity;With written HEP provided;With Supervision Additional ADL Goal #1: pt will perform bed mobility with supervision in prep for ADLs  Plan Frequency remains appropriate;Discharge plan remains appropriate    Co-evaluation                 AM-PAC OT "6 Clicks" Daily Activity     Outcome Measure   Help from another person eating meals?: A  Little Help from another person taking care of personal grooming?: A Little Help from another person toileting, which includes using toliet, bedpan, or urinal?: Total Help from another person bathing (including washing, rinsing, drying)?: A Lot Help from another person to put on and taking off regular upper body clothing?: A Little Help from another person to put on and taking off regular lower body clothing?: A Lot 6 Click Score: 14    End of Session Equipment Utilized During Treatment: Gait belt  OT Visit Diagnosis: Unsteadiness on feet (R26.81);Pain;Other abnormalities of gait and mobility (R26.89);Muscle weakness (generalized) (M62.81) Pain - Right/Left: Left Pain - part of body: Shoulder;Arm;Hand   Activity Tolerance Patient tolerated treatment well   Patient Left in bed;with call bell/phone within reach;with bed alarm set   Nurse Communication Mobility status        Time: [id number] OT Time Calculation (min): 27 min  Charges: OT General Charges $OT Visit: 1 Visit OT Treatments $  Self Care/Home Management : 8-22 mins $Therapeutic Activity: 8-22 mins  Shade Flood, OTR/L Acute Rehabilitation Services Office [place] Communication Preferred   Elliot Cousin 06/02/2022, 5:30 PM

## 2022-06-02 NOTE — Progress Notes (Signed)
PROGRESS NOTE                                                                                                                                                                                                             Patient Demographics:    Deborah [name], is a 57 y.o. female, DOB - 1966/02/11, [id number]  Outpatient Primary MD for the patient is Cena Benton, MD    LOS - 4  Admit date - 05/28/2022    Chief Complaint  Patient presents with   Altered Mental Status       Brief Narrative (HPI from H&P)   57 y.o. female with medical history significant for insulin-dependent diabetes mellitus, hypertension, PAF on Eliquis, CKD stage IV, iron deficiency anemia, chronic pain, duodenal carcinoid tumor resected in 2021, and recent protracted admission for HHS which was complicated by AKI, transfusion dependent anemia, and difficult pain control, now presenting with altered mental status.   Patient was discharged to an SNF on 05/27/2022, was noted by SNF personnel to be altered all throughout the day, and she was eventually sent to the ED.  Patient has no acute complaints in the emergency department and does not understand why she was sent here.  She denies chest pain, nausea, vomiting, or diarrhea.  She denies fever or chills, she denies dysuria, hematuria, or flank pain.   In the ED her workup was suggestive of dehydration, AKI and metabolic encephalopathy, head CT was unremarkable, with ammonia level was stable, blood gas did not suggest any CO2 narcosis, she was had no focal deficits and was kept in the hospital for further treatment.  The workup suggested disseminated gout.   Subjective:   Patient in bed, appears comfortable, denies any headache, no fever, no chest pain or pressure, no shortness of breath , +ve mild but generalized abdominal pain. No new focal weakness.    Assessment  & Plan :   1.  Acute  metabolic encephalopathy due to dehydration, AKI on CKD IV & extremely elevated uric acid levels causing disseminated gout -  - Her baseline creatinine is around 3.8, renal function has certainly worsened, she is being hydrated with IV fluids, urine electrolytes ordered, renal ultrasound also ordered, will monitor frequent bladder scans.  Head CT unremarkable, no focal deficits, stable ammonia levels, blood gas does not suggest CO2 retention,  will chest x-ray, stable UA, stable procalcitonin, stable EEG.    Uric acid levels were extremely elevated, this is suggestive of disseminated gout with disseminated inflammation with extremely elevated uric acid levels, elevated ferritin, CRP with stable calcitonin levels when accounted for renal dysfunction, causing dehydration, AKI and mental status change, she was hydrated, given challenge with IV steroids placed on colchicine and Uloric,  AKI improving as well with IV fluids, her generalized body pains had improved however upon steroid tapering it has gotten worse, increase steroids again on 06/02/2022 and monitor.   2. Acute encephalopathy with some nonspecific bizarre behavior from time to time -With above treatment encephalopathy has improved however intermittently patient has some bizarre behavior, she was seen by psych medications adjusted, became extremely confused late night 06/01/2022 and early morning 06/02/2022, this morning somewhat better, continue present line of care and continue to monitor.   3. Lumbar spinal stenosis  - With b/l LE weakness, wheelchair-bound for months  - MRI during recent admission reviewed by Dr. Ronnald Ramp of neurosurgery who recommended PT and outpatient follow-up  - Continue PT and outpatient neurosurgery follow-up as planned    4. Anemia, transfusion dependent.  Negative FOBT recently. - Hgb dropped from 7.2 at the time of admission due to heme dilution, type screen 2 units of packed RBC transfusion, continue PPI, no signs of  ongoing bleeding, stable anemia panel, pending peripheral smear.   5. PAF  - Continue Eliquis and metoprolol     6. Leukocytosis; thrombocytosis - chronic - Appears chronic and stable, no apparent infection, recent procalcitonin levels were low, and she has been advised to follow-up with hematology, most of the changes are chronic, check smear, procalcitonin and CRP.   7.  Hyponatremia  -Hydrate and monitor  8.  Chronic pain and chronic heavy baseline narcotic use.  On high doses of chronic Dilaudid, dose adjusted for better control on 06/01/2022.  Will request PCP to gradually taper off high-dose narcotic use for chronic pain.  Complains of some generalized abdominal pain on 06/02/2022, will check CT abdomen pelvis with oral contrast.  Bladder scan stable.  9.  Insulin-dependent DM  - A1c was 10.1% in January 2024  - Check CBGs and continue long- and short-acting insulin, since she has been exposed to steroids dose adjusted for insulin on 05/30/2022, of note patient is eating 2 to 3 trays of meals on certain occasions will be cautious in any using insulin too much as oral intake is inconsistent.   CBG (last 3)  Recent Labs    05/31/22 2125 06/01/22 1538 06/01/22 2136  GLUCAP 243* 237* 122*    Lab Results  Component Value Date   HGBA1C 10.1 (H) 03/21/2022        Condition - Extremely Guarded  Family Communication  : Called mother Vicente Males [phone]  on 05/29/2022 at 10:49 AM, POA [place] [phone] Niece on 05/30/22  Code Status :  Full  Consults  : Psych consult requested   PUD Prophylaxis : PPI   Procedures  :     CT head.  Nonacute.    EEG. Non acute  Renal US - Non acute      Disposition Plan  :    Status is: Inpt  DVT Prophylaxis  :    apixaban (ELIQUIS) tablet 5 mg     Lab Results  Component Value Date   PLT 562 (H) 06/01/2022    Diet :  Diet Order  Diet Carb Modified Fluid consistency: Thin; Room service appropriate? Yes  Diet  effective now                    Inpatient Medications  Scheduled Meds:  amLODipine  10 mg Oral Daily   apixaban  5 mg Oral BID   atorvastatin  10 mg Oral Daily   carvedilol  12.5 mg Oral BID WC   colchicine  0.3 mg Oral Daily   DULoxetine  40 mg Oral BID   febuxostat  40 mg Oral Daily   ferrous sulfate  325 mg Oral BID WC   folic acid  1 mg Oral Daily   insulin aspart  0-5 Units Subcutaneous QHS   insulin aspart  0-9 Units Subcutaneous TID WC   insulin aspart  6 Units Subcutaneous TID WC   insulin glargine-yfgn  16 Units Subcutaneous BID   methylPREDNISolone (SOLU-MEDROL) injection  40 mg Intravenous Daily   mirtazapine  15 mg Oral QHS   pantoprazole  40 mg Oral BID   sodium bicarbonate  650 mg Oral BID   Continuous Infusions:   PRN Meds:.acetaminophen **OR** acetaminophen, albuterol, fentaNYL (SUBLIMAZE) injection, guaiFENesin, hydrALAZINE, HYDROmorphone, metoprolol tartrate, naLOXone (NARCAN)  injection, ondansetron **OR** ondansetron (ZOFRAN) IV, mouth rinse  Antibiotics  :    Anti-infectives (From admission, onward)    None         Objective:   Vitals:   06/01/22 1700 06/01/22 2002 06/01/22 2325 06/02/22 0456  BP: (!) 145/93 (!) 168/91 (!) 178/106 (!) 160/99  Pulse: (!) 104 (!) 103 (!) 102 (!) 104  Resp: 18 16 18 16   Temp: 98 F (36.7 C) 98.2 F (36.8 C) 98.4 F (36.9 C) 98.1 F (36.7 C)  TempSrc: Oral Oral Oral Oral  SpO2: 95% 92% 91% 92%  Weight:        Wt Readings from Last 3 Encounters:  06/01/22 132 kg  05/13/22 (!) 139.2 kg  03/20/22 115.2 kg     Intake/Output Summary (Last 24 hours) at 06/02/2022 0717 Last data filed at 06/01/2022 2000 Gross per 24 hour  Intake --  Output 550 ml  Net -550 ml     Physical Exam  Awake Alert, O x 3, No new F.N deficits, anxious affect [place].AT,PERRAL Supple Neck, No JVD,   Symmetrical Chest wall movement, Good air movement bilaterally, CTAB RRR,No Gallops, Rubs or new Murmurs,  +ve B.Sounds, Abd  Soft, No tenderness,   No Cyanosis, Clubbing or edema         Data Review:    Recent Labs  Lab 05/28/22 1819 05/28/22 1825 05/29/22 0728 05/29/22 1933 05/30/22 0357 05/31/22 0726 06/01/22 0522  WBC 17.0*  --  16.7*  --  12.5* 17.6* 16.7*  HGB 7.2*   < > 6.7* 8.0* 8.2* 8.0* 9.6*  HCT 24.4*   < > 21.5* 26.0* 27.3* 25.0* 30.8*  PLT 602*  --  576*  --  420* 503* 562*  MCV 90.0  --  86.3  --  88.3 84.2 84.8  MCH 26.6  --  26.9  --  26.5 26.9 26.4  MCHC 29.5*  --  31.2  --  30.0 32.0 31.2  RDW 19.8*  --  19.6*  --  20.2* 19.9* 19.5*  LYMPHSABS 1.8  --   --   --  1.7 1.9 1.6  MONOABS 1.1*  --   --   --  0.8 1.2* 0.6  EOSABS 0.2  --   --   --  0.3 0.0 0.0  BASOSABS 0.1  --   --   --  0.1 0.0 0.0   < > = values in this interval not displayed.    Recent Labs  Lab 05/28/22 1819 05/28/22 1825 05/29/22 0728 05/29/22 1038 05/30/22 0357 05/31/22 0726 06/01/22 0522  NA 128* 130* 131*  --  136 136 139  K 4.8 4.9 4.3  --  4.3 3.8 4.1  CL 93*  --  91*  --  103 100 101  CO2 20*  --  23  --  19* 25 25  ANIONGAP 15  --  17*  --  14 11 13   GLUCOSE 193*  --  215*  --  141* 281* 236*  BUN 113*  --  112*  --  98* 81* 68*  CREATININE 4.59*  --  4.44*  --  3.20* 2.60* 2.13*  AST 22  --   --   --  22 16 14*  ALT 17  --   --   --  15 15 16   ALKPHOS 91  --   --   --  74 79 78  BILITOT 0.9  --   --   --  1.2 0.8 0.6  ALBUMIN 2.4*  --   --   --  2.1* 2.1* 2.4*  CRP  --   --   --  19.6* 14.2* 8.6* 6.1*  PROCALCITON  --   --   --  0.28 0.16 <0.10  --   TSH  --   --   --  1.976  --   --   --   AMMONIA 19  --   --   --   --   --   --   BNP  --   --   --   --  54.7 160.3* 534.9*  MG  --   --  2.2  --  2.1 1.9 1.7  CALCIUM 9.2  --  9.2  --  8.8* 9.1 9.4   Lab Results  Component Value Date   HGBA1C 10.1 (H) 03/21/2022        Radiology Reports US RENAL  Result Date: 05/29/2022 CLINICAL DATA:  Acute kidney injury EXAM: RENAL / URINARY TRACT ULTRASOUND COMPLETE COMPARISON:  03/19/2022  renal ultrasound FINDINGS: Right Kidney: Renal measurements: 11.5 x 5.0 x 5.3 cm = volume: 159 mL. Echogenicity within normal limits. No mass or hydronephrosis visualized. Left Kidney: Renal measurements: 10.5 x 6.1 x 4.8 cm = volume: 161 mL. Echogenicity within normal limits. Nonshadowing echogenic foci are noted in the mid left kidney, but no mass or hydronephrosis visualized. Bladder: Appears normal for degree of bladder distention. Bladder jets were not visualized. Other: Evaluation is somewhat limited by body habitus and overlying bowel gas. IMPRESSION: No significant sonographic abnormality of the kidneys. Electronically Signed   By: Merilyn Baba M.D.   On: 05/29/2022 12:01   EEG adult  Result Date: 05/29/2022 Lora Havens, MD     05/29/2022  2:06 PM Patient Name: LYLIAH LAVOY MRN: [id number] Epilepsy Attending: Lora Havens Referring Physician/Provider: Thurnell Lose, MD Date: 05/29/2022 Duration: 22.35 mins Patient history: 57yo F with ams. EEG to evaluate for seizure Level of alertness: Awake AEDs during EEG study: None Technical aspects: This EEG study was done with scalp electrodes positioned according to the 10-20 International system of electrode placement. Electrical activity was reviewed with band pass filter of 1-70Hz , sensitivity of 7 uV/mm, display speed of 61mm/sec with a  60Hz  notched filter applied as appropriate. EEG data were recorded continuously and digitally stored.  Video monitoring was available and reviewed as appropriate. Description: EEG showed continuous generalized 3 to 6 Hz theta-delta slowing. Generalized periodic discharges with triphasic morphology at  1Hz  were also noted. Hyperventilation and photic stimulation were not performed.   ABNORMALITY - Periodic discharges with triphasic morphology, generalized ( GPDs) - Continuous slow, generalized IMPRESSION: This study is suggestive of moderate diffuse encephalopathy, nonspecific etiology but likely related to  toxic-metabolic causes. No seizures or definite epileptiform discharges were seen throughout the recording. Lora Havens      Signature  -   Lala Lund M.D on 06/02/2022 at 7:17 AM   -  To page go to [email or web address]

## 2022-06-02 NOTE — Progress Notes (Signed)
Patient cbg 343.  Gave 7 units novalog for coverage and gave the 6 addl for meal coverage.  Total novalog 13 units given.  Patient has not been eating hospital meals today, however has drank 10 ounces of a 20 ounce Leary Roca that is not diet this afternoon.  Patient is not compliant with her diet.  I did advise Dr. Candiss Norse of this and he said to have nightshift recheck her CBG at 2100.
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
## 2022-06-03 DIAGNOSIS — R4182 Altered mental status, unspecified: Secondary | ICD-10-CM | POA: Diagnosis not present

## 2022-06-03 DIAGNOSIS — N179 Acute kidney failure, unspecified: Secondary | ICD-10-CM | POA: Diagnosis not present

## 2022-06-03 DIAGNOSIS — N184 Chronic kidney disease, stage 4 (severe): Secondary | ICD-10-CM | POA: Diagnosis not present

## 2022-06-03 LAB — COMPREHENSIVE METABOLIC PANEL
ALT: 16 U/L (ref 0–44)
AST: 17 U/L (ref 15–41)
Albumin: 2.4 g/dL — ABNORMAL LOW (ref 3.5–5.0)
Alkaline Phosphatase: 84 U/L (ref 38–126)
Anion gap: 10 (ref 5–15)
BUN: 43 mg/dL — ABNORMAL HIGH (ref 6–20)
CO2: 26 mmol/L (ref 22–32)
Calcium: 8.7 mg/dL — ABNORMAL LOW (ref 8.9–10.3)
Chloride: 102 mmol/L (ref 98–111)
Creatinine, Ser: 2.04 mg/dL — ABNORMAL HIGH (ref 0.44–1.00)
GFR, Estimated: 28 mL/min — ABNORMAL LOW (ref >60)
Glucose, Bld: 206 mg/dL — ABNORMAL HIGH (ref 70–99)
Potassium: 3.5 mmol/L (ref 3.5–5.1)
Sodium: 138 mmol/L (ref 135–145)
Total Bilirubin: 0.7 mg/dL (ref 0.3–1.2)
Total Protein: 7.7 g/dL (ref 6.5–8.1)

## 2022-06-03 LAB — CBC WITH DIFFERENTIAL/PLATELET
Abs Immature Granulocytes: 0.11 10*3/uL — ABNORMAL HIGH (ref 0.00–0.07)
Basophils Absolute: 0 10*3/uL (ref 0.0–0.1)
Basophils Relative: 0 %
Eosinophils Absolute: 0 10*3/uL (ref 0.0–0.5)
Eosinophils Relative: 0 %
HCT: 31.4 % — ABNORMAL LOW (ref 36.0–46.0)
Hemoglobin: 9.6 g/dL — ABNORMAL LOW (ref 12.0–15.0)
Immature Granulocytes: 1 %
Lymphocytes Relative: 11 %
Lymphs Abs: 2 10*3/uL (ref 0.7–4.0)
MCH: 26.2 pg (ref 26.0–34.0)
MCHC: 30.6 g/dL (ref 30.0–36.0)
MCV: 85.6 fL (ref 80.0–100.0)
Monocytes Absolute: 1 10*3/uL (ref 0.1–1.0)
Monocytes Relative: 6 %
Neutro Abs: 14.3 10*3/uL — ABNORMAL HIGH (ref 1.7–7.7)
Neutrophils Relative %: 82 %
Platelets: 480 10*3/uL — ABNORMAL HIGH (ref 150–400)
RBC: 3.67 MIL/uL — ABNORMAL LOW (ref 3.87–5.11)
RDW: 19.1 % — ABNORMAL HIGH (ref 11.5–15.5)
WBC: 17.4 10*3/uL — ABNORMAL HIGH (ref 4.0–10.5)
nRBC: 0 % (ref 0.0–0.2)

## 2022-06-03 LAB — GLUCOSE, CAPILLARY
Glucose-Capillary: 152 mg/dL — ABNORMAL HIGH (ref 70–99)
Glucose-Capillary: 204 mg/dL — ABNORMAL HIGH (ref 70–99)
Glucose-Capillary: 243 mg/dL — ABNORMAL HIGH (ref 70–99)
Glucose-Capillary: 295 mg/dL — ABNORMAL HIGH (ref 70–99)
Glucose-Capillary: 236 mg/dL — ABNORMAL HIGH (ref 70–99)

## 2022-06-03 LAB — C-REACTIVE PROTEIN: CRP: 1.3 mg/dL — ABNORMAL HIGH (ref <1.0)

## 2022-06-03 LAB — BRAIN NATRIURETIC PEPTIDE: B Natriuretic Peptide: 384.8 pg/mL — ABNORMAL HIGH (ref 0.0–100.0)

## 2022-06-03 LAB — URIC ACID: Uric Acid, Serum: 10.4 mg/dL — ABNORMAL HIGH (ref 2.5–7.1)

## 2022-06-03 LAB — MAGNESIUM: Magnesium: 1.4 mg/dL — ABNORMAL LOW (ref 1.7–2.4)

## 2022-06-03 MED ORDER — POTASSIUM CHLORIDE CRYS ER 20 MEQ PO TBCR
40.0000 meq | EXTENDED_RELEASE_TABLET | Freq: Once | ORAL | Status: AC
  Administered 2022-06-03: 40 meq via ORAL
  Filled 2022-06-03: qty 2

## 2022-06-03 MED ORDER — ALLOPURINOL 100 MG PO TABS
50.0000 mg | ORAL_TABLET | Freq: Every day | ORAL | Status: DC
  Administered 2022-06-03 – 2022-06-04 (×2): 50 mg via ORAL
  Filled 2022-06-03 (×2): qty 1

## 2022-06-03 MED ORDER — INSULIN GLARGINE-YFGN 100 UNIT/ML ~~LOC~~ SOLN
20.0000 [IU] | Freq: Two times a day (BID) | SUBCUTANEOUS | Status: DC
  Administered 2022-06-03 – 2022-06-04 (×3): 20 [IU] via SUBCUTANEOUS
  Filled 2022-06-03 (×4): qty 0.2

## 2022-06-03 MED ORDER — MAGNESIUM SULFATE 4 GM/100ML IV SOLN
4.0000 g | Freq: Once | INTRAVENOUS | Status: AC
  Administered 2022-06-03: 4 g via INTRAVENOUS
  Filled 2022-06-03: qty 100

## 2022-06-03 MED ORDER — HYDRALAZINE HCL 50 MG PO TABS
50.0000 mg | ORAL_TABLET | Freq: Three times a day (TID) | ORAL | Status: DC
  Administered 2022-06-03 – 2022-06-04 (×4): 50 mg via ORAL
  Filled 2022-06-03 (×4): qty 1

## 2022-06-03 NOTE — TOC Progression Note (Addendum)
Transition of Care [facility]) - Progression Note    Patient Details  Name: Deborah [name] MRN: [id number] Date of Birth: 1966-02-12  Transition of Care [facility] CM/SW [place], LCSW Phone Number: 06/03/2022, 8:57 AM  Clinical Narrative:    Insurance approval received for [place], Ref# [id number], Auth ID# [id number], effective 06/02/2022-06/04/2022.  CSW updated patient's Niece.    Expected Discharge Plan: [place] Barriers to Discharge: Continued Medical Work up  Expected Discharge Plan and Services In-house Referral: Clinical Social Work   Post Acute Care Choice: [place] Living arrangements for the past 2 months:  (from [place] [facility] short term)                                       Social Determinants of Health (SDOH) Interventions SDOH Screenings   Food Insecurity: Food Insecurity Present (05/29/2022)  Housing: High Risk (05/29/2022)  Transportation Needs: Unmet Transportation Needs (05/29/2022)  Utilities: At Risk (05/29/2022)  Depression (PHQ2-9): Medium Risk (09/16/2021)  Tobacco Use: Low Risk  (05/29/2022)    Readmission Risk Interventions     No data to display

## 2022-06-03 NOTE — Progress Notes (Signed)
PROGRESS NOTE                                                                                                                                                                                                             Patient Demographics:    Deborah [name], is a 57 y.o. female, DOB - 02-05-1966, [id number]  Outpatient Primary MD for the patient is Cena Benton, MD    LOS - 5  Admit date - 05/28/2022    Chief Complaint  Patient presents with   Altered Mental Status       Brief Narrative (HPI from H&P)   57 y.o. female with medical history significant for insulin-dependent diabetes mellitus, hypertension, PAF on Eliquis, CKD stage IV, iron deficiency anemia, chronic pain, duodenal carcinoid tumor resected in 2021, and recent protracted admission for HHS which was complicated by AKI, transfusion dependent anemia, and difficult pain control, now presenting with altered mental status.   Patient was discharged to an SNF on 05/27/2022, was noted by SNF personnel to be altered all throughout the day, and she was eventually sent to the ED.  Patient has no acute complaints in the emergency department and does not understand why she was sent here.  She denies chest pain, nausea, vomiting, or diarrhea.  She denies fever or chills, she denies dysuria, hematuria, or flank pain.   In the ED her workup was suggestive of dehydration, AKI and metabolic encephalopathy, head CT was unremarkable, with ammonia level was stable, blood gas did not suggest any CO2 narcosis, she was had no focal deficits and was kept in the hospital for further treatment.  The workup suggested disseminated gout.   Subjective:   Patient in bed, appears comfortable, denies any headache, no fever, no chest pain or pressure, no shortness of breath , no abdominal pain. No new focal weakness.  Generalized body aches much better.   Assessment  & Plan :    1.  Acute metabolic encephalopathy due to dehydration, AKI on CKD IV & extremely elevated uric acid levels causing disseminated gout -  - Her baseline creatinine is around 3.8, renal function has certainly worsened, she is being hydrated with IV fluids, urine electrolytes ordered, renal ultrasound also ordered, will monitor frequent bladder scans.  Head CT unremarkable, no focal deficits, stable ammonia levels, blood gas does not suggest  CO2 retention, will chest x-ray, stable UA, stable procalcitonin, stable EEG.    Uric acid levels were extremely elevated, this is suggestive of disseminated gout with disseminated inflammation with extremely elevated uric acid levels, elevated ferritin, CRP with stable calcitonin levels when accounted for renal dysfunction, causing dehydration, AKI and mental status change, she was hydrated, given challenge with IV steroids placed on colchicine and Uloric & low dose Allopurinol,  AKI improving as well with IV fluids, her generalized body pains had improved however upon steroid tapering it has gotten worse, increase steroids again on 06/02/2022 and monitor.   2. Acute encephalopathy with some nonspecific bizarre behavior from time to time -With above treatment encephalopathy has improved however intermittently patient has some bizarre behavior, she was seen by psych medications adjusted, became extremely confused late night 06/01/2022 and early morning 06/02/2022, this morning somewhat better, continue present line of care and continue to monitor.   3. Lumbar spinal stenosis  - With b/l LE weakness, wheelchair-bound for months  - MRI during recent admission reviewed by Dr. Ronnald Ramp of neurosurgery who recommended PT and outpatient follow-up  - Continue PT and outpatient neurosurgery follow-up as planned    4. Anemia, transfusion dependent.  Negative FOBT recently. - Hgb dropped from 7.2 at the time of admission due to heme dilution, type screen 2 units of packed RBC  transfusion, continue PPI, no signs of ongoing bleeding, stable anemia panel, will have her follow with hematology outpatient.  She has mild acute on chronic reactive leukocytosis due to steroids.  Monitor.   5. PAF  - Continue Eliquis and metoprolol     6. Leukocytosis; thrombocytosis - chronic - Appears chronic and stable, no apparent infection, recent procalcitonin levels were low, and she has been advised to follow-up with hematology, most of the changes are chronic, stable procalcitonin and CRP.   7.  Hyponatremia  -Hydrate and monitor  8.  Chronic pain and chronic heavy baseline narcotic use.  On high doses of chronic Dilaudid, dose adjusted for better control on 06/01/2022.  Will request PCP to gradually taper off high-dose narcotic use for chronic pain.  Complains of some generalized abdominal pain on 06/02/2022, stable CT abdomen pelvis with oral contrast.  Bladder scan stable. Pain resolved.  9.  HTN - on Norvasc, Coreg, hydralazine added for better control.  10. Insulin-dependent DM  - A1c was 10.1% in January 2024  - Check CBGs and continue long- and short-acting insulin, since she has been exposed to steroids dose adjusted for insulin on 05/30/2022, of note patient is eating 2 to 3 trays of meals on certain occasions will be cautious in any using insulin too much as oral intake is inconsistent.   CBG (last 3)  Recent Labs    06/02/22 1730 06/02/22 2107 06/03/22 0812  GLUCAP 343* 287* 152*    Lab Results  Component Value Date   HGBA1C 10.1 (H) 03/21/2022        Condition - Extremely Guarded  Family Communication  : Called mother Vicente Males [phone]  on 05/29/2022 at 10:49 AM, POA [place] [phone] Niece on 05/30/22  Code Status :  Full  Consults  : Psych consult requested   PUD Prophylaxis : PPI   Procedures  :     CT head.  Nonacute.    EEG. Non acute  Renal US - Non acute      Disposition Plan  :    Status is: Inpt  DVT Prophylaxis  :    apixaban  (  ELIQUIS) tablet 5 mg     Lab Results  Component Value Date   PLT 480 (H) 06/03/2022    Diet :  Diet Order             Diet Carb Modified Fluid consistency: Thin; Room service appropriate? Yes  Diet effective now                    Inpatient Medications  Scheduled Meds:  allopurinol  50 mg Oral Daily   amLODipine  10 mg Oral Daily   apixaban  5 mg Oral BID   atorvastatin  10 mg Oral Daily   carvedilol  12.5 mg Oral BID WC   colchicine  0.3 mg Oral Daily   donepezil  5 mg Oral QHS   DULoxetine  40 mg Oral BID   febuxostat  40 mg Oral Daily   ferrous sulfate  325 mg Oral BID WC   folic acid  1 mg Oral Daily   hydrALAZINE  50 mg Oral Q8H   insulin aspart  0-5 Units Subcutaneous QHS   insulin aspart  0-9 Units Subcutaneous TID WC   insulin aspart  6 Units Subcutaneous TID WC   insulin glargine-yfgn  20 Units Subcutaneous BID   methylPREDNISolone (SOLU-MEDROL) injection  40 mg Intravenous Daily   mirtazapine  15 mg Oral QHS   pantoprazole  40 mg Oral BID   sodium bicarbonate  650 mg Oral BID   Continuous Infusions:  magnesium sulfate bolus IVPB 4 g (06/03/22 0726)    PRN Meds:.acetaminophen **OR** acetaminophen, albuterol, fentaNYL (SUBLIMAZE) injection, guaiFENesin, hydrALAZINE, HYDROmorphone, metoprolol tartrate, naLOXone (NARCAN)  injection, ondansetron **OR** ondansetron (ZOFRAN) IV, mouth rinse  Antibiotics  :    Anti-infectives (From admission, onward)    None         Objective:   Vitals:   06/03/22 0056 06/03/22 0328 06/03/22 0500 06/03/22 0732  BP: (!) 167/106 (!) 152/106  (!) 161/98  Pulse: 99 99  98  Resp: 16 18    Temp: (!) 97.3 F (36.3 C) 97.7 F (36.5 C)    TempSrc: Oral Oral    SpO2:      Weight:   130.3 kg     Wt Readings from Last 3 Encounters:  06/03/22 130.3 kg  05/13/22 (!) 139.2 kg  03/20/22 115.2 kg    No intake or output data in the 24 hours ending 06/03/22 0916    Physical Exam  Awake Alert, O x 3, No new F.N  deficits, anxious affect [place].AT,PERRAL Supple Neck, No JVD,   Symmetrical Chest wall movement, Good air movement bilaterally, CTAB RRR,No Gallops, Rubs or new Murmurs,  +ve B.Sounds, Abd Soft, No tenderness,   No Cyanosis, Clubbing or edema         Data Review:    Recent Labs  Lab 05/30/22 0357 05/31/22 0726 06/01/22 0522 06/02/22 0633 06/03/22 0432  WBC 12.5* 17.6* 16.7* 20.2* 17.4*  HGB 8.2* 8.0* 9.6* 9.7* 9.6*  HCT 27.3* 25.0* 30.8* 32.0* 31.4*  PLT 420* 503* 562* 559* 480*  MCV 88.3 84.2 84.8 87.0 85.6  MCH 26.5 26.9 26.4 26.4 26.2  MCHC 30.0 32.0 31.2 30.3 30.6  RDW 20.2* 19.9* 19.5* 19.4* 19.1*  LYMPHSABS 1.7 1.9 1.6 1.9 2.0  MONOABS 0.8 1.2* 0.6 1.0 1.0  EOSABS 0.3 0.0 0.0 0.1 0.0  BASOSABS 0.1 0.0 0.0 0.0 0.0    Recent Labs  Lab 05/28/22 1819 05/28/22 1825 05/29/22 1038 05/30/22 0357 05/31/22 0726 06/01/22  0522 06/02/22 0633 06/03/22 0432  NA 128*   < >  --  136 136 139 144 138  K 4.8   < >  --  4.3 3.8 4.1 3.8 3.5  CL 93*   < >  --  103 100 101 102 102  CO2 20*   < >  --  19* 25 25 27 26   ANIONGAP 15   < >  --  14 11 13 15 10   GLUCOSE 193*   < >  --  141* 281* 236* 187* 206*  BUN 113*   < >  --  98* 81* 68* 49* 43*  CREATININE 4.59*   < >  --  3.20* 2.60* 2.13* 1.92* 2.04*  AST 22  --   --  22 16 14* 24 17  ALT 17  --   --  15 15 16 19 16   ALKPHOS 91  --   --  74 79 78 86 84  BILITOT 0.9  --   --  1.2 0.8 0.6 0.6 0.7  ALBUMIN 2.4*  --   --  2.1* 2.1* 2.4* 2.5* 2.4*  CRP  --    < > 19.6* 14.2* 8.6* 6.1* 2.7* 1.3*  PROCALCITON  --   --  0.28 0.16 <0.10  --   --   --   TSH  --   --  1.976  --   --   --   --   --   AMMONIA 19  --   --   --   --   --   --   --   BNP  --   --   --  54.7 160.3* 534.9* 628.4* 384.8*  MG  --    < >  --  2.1 1.9 1.7 1.5* 1.4*  CALCIUM 9.2   < >  --  8.8* 9.1 9.4 9.4 8.7*   < > = values in this interval not displayed.   Lab Results  Component Value Date   HGBA1C 10.1 (H) 03/21/2022        Radiology Reports CT  ABDOMEN PELVIS WO CONTRAST  Result Date: 06/02/2022 CLINICAL DATA:  Abdominal pain.  Stage 4 kidney disease. EXAM: CT ABDOMEN AND PELVIS WITHOUT CONTRAST TECHNIQUE: Multidetector CT imaging of the abdomen and pelvis was performed following the standard protocol without IV contrast. RADIATION DOSE REDUCTION: This exam was performed according to the departmental dose-optimization program which includes automated exposure control, adjustment of the mA and/or kV according to patient size and/or use of iterative reconstruction technique. COMPARISON:  CT, 05/10/2022. FINDINGS: Lower chest: Clear lung bases. Hepatobiliary: Liver normal in size. No liver mass. Status post cholecystectomy. No bile duct dilation. Pancreas: Unremarkable. No pancreatic ductal dilatation or surrounding inflammatory changes. Spleen: Normal in size without focal abnormality. Adrenals/Urinary Tract: Adrenal glands are unremarkable. Kidneys are normal, without renal calculi, focal lesion, or hydronephrosis. Bladder is unremarkable. Stomach/Bowel: Minimal hiatal hernia. Stomach otherwise unremarkable. Small bowel and colon are normal in caliber. No wall thickening. No inflammation. Normal appendix. Vascular/Lymphatic: Minimal aortic atherosclerotic calcification. No aneurysm. No enlarged lymph nodes. Reproductive: Pedunculated fibroid from the left uterine fundus, 4.6 cm, unchanged. Uterus otherwise unremarkable. No other adnexal abnormalities. Other: No abdominal wall hernia or abnormality. No abdominopelvic ascites. Musculoskeletal: No fracture or acute finding.  No bone lesion. IMPRESSION: 1. No acute findings within the abdomen or pelvis. No findings to account for the patient's abdominal pain. Electronically Signed   By: Dedra Skeens.D.  On: 06/02/2022 11:33   DG Abd Portable 1V  Result Date: 06/02/2022 CLINICAL DATA:  Constipation EXAM: PORTABLE ABDOMEN - 1 VIEW COMPARISON:  03/17/2022 FINDINGS: Normal bowel gas pattern. No abnormal  stool retention. Cholecystectomy clips. No concerning mass effect or calcification. IMPRESSION: Normal bowel gas pattern.  No abnormal stool retention. Electronically Signed   By: Jorje Guild M.D.   On: 06/02/2022 07:17      Signature  -   Lala Lund M.D on 06/03/2022 at 9:16 AM   -  To page go to [email or web address]

## 2022-06-03 NOTE — Plan of Care (Signed)

## 2022-06-04 DIAGNOSIS — N179 Acute kidney failure, unspecified: Secondary | ICD-10-CM | POA: Diagnosis not present

## 2022-06-04 DIAGNOSIS — N184 Chronic kidney disease, stage 4 (severe): Secondary | ICD-10-CM | POA: Diagnosis not present

## 2022-06-04 LAB — COMPREHENSIVE METABOLIC PANEL
ALT: 14 U/L (ref 0–44)
AST: 10 U/L — ABNORMAL LOW (ref 15–41)
Albumin: 2.4 g/dL — ABNORMAL LOW (ref 3.5–5.0)
Alkaline Phosphatase: 72 U/L (ref 38–126)
Anion gap: 10 (ref 5–15)
BUN: 47 mg/dL — ABNORMAL HIGH (ref 6–20)
CO2: 23 mmol/L (ref 22–32)
Calcium: 8.4 mg/dL — ABNORMAL LOW (ref 8.9–10.3)
Chloride: 99 mmol/L (ref 98–111)
Creatinine, Ser: 2.21 mg/dL — ABNORMAL HIGH (ref 0.44–1.00)
GFR, Estimated: 25 mL/min — ABNORMAL LOW (ref >60)
Glucose, Bld: 150 mg/dL — ABNORMAL HIGH (ref 70–99)
Potassium: 3.6 mmol/L (ref 3.5–5.1)
Sodium: 132 mmol/L — ABNORMAL LOW (ref 135–145)
Total Bilirubin: 0.4 mg/dL (ref 0.3–1.2)
Total Protein: 6.9 g/dL (ref 6.5–8.1)

## 2022-06-04 LAB — C-REACTIVE PROTEIN: CRP: 0.7 mg/dL (ref <1.0)

## 2022-06-04 LAB — GLUCOSE, CAPILLARY
Glucose-Capillary: 183 mg/dL — ABNORMAL HIGH (ref 70–99)
Glucose-Capillary: 260 mg/dL — ABNORMAL HIGH (ref 70–99)

## 2022-06-04 LAB — MAGNESIUM: Magnesium: 2.1 mg/dL (ref 1.7–2.4)

## 2022-06-04 MED ORDER — TORSEMIDE 20 MG PO TABS: 20.0000 mg | ORAL_TABLET | Freq: Every day | ORAL | Status: DC

## 2022-06-04 MED ORDER — MIRTAZAPINE 15 MG PO TBDP: 15.0000 mg | ORAL_TABLET | Freq: Every day | ORAL | Status: DC

## 2022-06-04 MED ORDER — INSULIN LISPRO (1 UNIT DIAL) 100 UNIT/ML (KWIKPEN): PEN_INJECTOR | SUBCUTANEOUS | 0 refills | Status: DC

## 2022-06-04 MED ORDER — HYDRALAZINE HCL 50 MG PO TABS: 50.0000 mg | ORAL_TABLET | Freq: Three times a day (TID) | ORAL | Status: DC

## 2022-06-04 MED ORDER — FEBUXOSTAT 40 MG PO TABS: 40.0000 mg | ORAL_TABLET | Freq: Every day | ORAL | Status: DC

## 2022-06-04 MED ORDER — INSULIN GLARGINE-YFGN 100 UNIT/ML ~~LOC~~ SOLN: 20.0000 [IU] | Freq: Two times a day (BID) | SUBCUTANEOUS | 0 refills | Status: DC

## 2022-06-04 MED ORDER — CARVEDILOL 12.5 MG PO TABS: 12.5000 mg | ORAL_TABLET | Freq: Two times a day (BID) | ORAL | Status: DC

## 2022-06-04 MED ORDER — AMLODIPINE BESYLATE 10 MG PO TABS: 10.0000 mg | ORAL_TABLET | Freq: Every day | ORAL | Status: DC

## 2022-06-04 MED ORDER — PREDNISONE 5 MG PO TABS: ORAL_TABLET | ORAL | 0 refills | Status: DC

## 2022-06-04 MED ORDER — HYDROMORPHONE HCL 2 MG PO TABS: 2.0000 mg | ORAL_TABLET | Freq: Three times a day (TID) | ORAL | 0 refills | Status: AC | PRN

## 2022-06-04 MED ORDER — INSULIN ASPART 100 UNIT/ML IJ SOLN: 5.0000 [IU] | Freq: Three times a day (TID) | INTRAMUSCULAR | 0 refills | Status: DC

## 2022-06-04 MED ORDER — COLCHICINE 0.6 MG PO TABS: 0.3000 mg | ORAL_TABLET | Freq: Every day | ORAL | Status: DC

## 2022-06-04 MED ORDER — ALLOPURINOL 100 MG PO TABS: 50.0000 mg | ORAL_TABLET | Freq: Every day | ORAL | Status: DC

## 2022-06-04 MED ORDER — DONEPEZIL HCL 5 MG PO TABS: 5.0000 mg | ORAL_TABLET | Freq: Every day | ORAL | Status: DC

## 2022-06-04 NOTE — Discharge Instructions (Signed)
Follow with Primary MD Cena Benton, MD in 3-4 days   Get CBC, CMP, TSH, uric acid level, magnesium-  checked next visit with your SNF MD   Activity: As tolerated with Full fall precautions use walker/cane & assistance as needed  Disposition SNF  Diet: Heart Healthy low carbohydrate diet, strict 1.5 L fluid restriction per day.  Check CBGs q. [place].  Special Instructions: If you have smoked or chewed Tobacco  in the last 2 yrs please stop smoking, stop any regular Alcohol  and or any Recreational drug use.  On your next visit with your primary care physician please Get Medicines reviewed and adjusted.  Please request your Prim.MD to go over all Hospital Tests and Procedure/Radiological results at the follow up, please get all Hospital records sent to your Prim MD by signing hospital release before you go home.  If you experience worsening of your admission symptoms, develop shortness of breath, life threatening emergency, suicidal or homicidal thoughts you must seek medical attention immediately by calling 911 or calling your MD immediately  if symptoms less severe.  You Must read complete instructions/literature along with all the possible adverse reactions/side effects for all the Medicines you take and that have been prescribed to you. Take any new Medicines after you have completely understood and accpet all the possible adverse reactions/side effects.

## 2022-06-04 NOTE — Care Management Important Message (Signed)
Important Message  Patient Details  Name: Deborah [name] MRN: [id number] Date of Birth: 1965/03/24   Medicare Important Message Given:  Yes     Hannah Beat 06/04/2022, 12:41 PM

## 2022-06-04 NOTE — Discharge Summary (Signed)
Deborah [name] [id number] DOB: 1966-01-13 DOA: 05/28/2022  PCP: Cena Benton, MD  Admit date: 05/28/2022  Discharge date: 06/04/2022  Admitted From: SNF   Disposition:  SNF   Recommendations for Outpatient Follow-up:   Follow up with PCP in 1-2 weeks  PCP Please obtain BMP/CBC, 2 view CXR in 1week,  (see Discharge instructions)   PCP Please follow up on the following pending results: Monitor CBC, CMP, uric acid levels closely, monitor CBGs closely with tapering steroids.  Adjust insulin dose as needed.  Needs close outpatient follow-up with rheumatology and psychiatry.   Home Health: None   Equipment/Devices: None  Consultations: None  Discharge Condition: Stable    CODE STATUS: Full    Diet Recommendation: Heart Healthy Low Carb, check CBGs q. ACH S.  Chief Complaint  Patient presents with   Altered Mental Status     Brief history of present illness from the day of admission and additional interim summary    57 y.o. female with medical history significant for insulin-dependent diabetes mellitus, hypertension, PAF on Eliquis, CKD stage IV, iron deficiency anemia, chronic pain, duodenal carcinoid tumor resected in 2021, and recent protracted admission for HHS which was complicated by AKI, transfusion dependent anemia, and difficult pain control, now presenting with altered mental status.   Patient was discharged to an SNF on 05/27/2022, was noted by SNF personnel to be altered all throughout the day, and she was eventually sent to the ED.  Patient has no acute complaints in the emergency department and does not understand why she was sent here.  She denies chest pain, nausea, vomiting, or diarrhea.  She denies fever or chills, she denies dysuria, hematuria, or flank pain.   In the ED her workup  was suggestive of dehydration, AKI and metabolic encephalopathy, head CT was unremarkable, with ammonia level was stable, blood gas did not suggest any CO2 narcosis, she was had no focal deficits and was kept in the hospital for further treatment.  The workup suggested disseminated gout.                                                                 Hospital Course   1.  Acute metabolic encephalopathy due to dehydration, AKI on CKD IV & extremely elevated uric acid levels causing disseminated gout -   - Her baseline creatinine is around 3.8, renal function has certainly worsened, she is being hydrated with IV fluids, urine electrolytes ordered, renal ultrasound also ordered, will monitor frequent bladder scans.  Head CT unremarkable, no focal deficits, stable ammonia levels, blood gas does not suggest CO2 retention, will chest x-ray, stable UA, stable procalcitonin, stable EEG.     Uric acid levels were extremely elevated, this is suggestive of disseminated gout with disseminated inflammation with extremely elevated uric acid  levels, elevated ferritin, CRP with stable calcitonin levels when accounted for renal dysfunction, causing dehydration, AKI and mental status change, she was hydrated, given challenge with IV steroids placed on colchicine and Uloric & low dose Allopurinol.  With above intervention she is remarkably improved, pain is much better controlled, AKI has improved renal function is at her baseline now, she will be placed on colchicine, Uloric along with allopurinol with ongoing steroid taper.  Will need outpatient monitoring of blood pressure, CBC, CMP, uric acid level.  Needs outpatient rheumatology follow-up within 1 to 2 weeks.  Overall much better.     2. Acute encephalopathy with some nonspecific bizarre behavior from time to time -With above treatment encephalopathy has improved however intermittently patient has some bizarre behavior, she was seen by psych medications adjusted,  she is now close to her baseline.  No acute issues.   3. Lumbar spinal stenosis  - With b/l LE weakness, wheelchair-bound for months  - MRI during recent admission reviewed by Dr. Ronnald Ramp of neurosurgery who recommended PT and outpatient follow-up  - Continue PT and outpatient neurosurgery follow-up as planned    4. Anemia, transfusion dependent.  Negative FOBT recently. - Hgb dropped from 7.2 at the time of admission due to heme dilution, type screen 2 units of packed RBC transfusion, continue PPI, no signs of ongoing bleeding, stable anemia panel, will have her follow with hematology outpatient.  She has mild acute on chronic reactive leukocytosis which is also stable.  Again outpatient hematology follow-up with her hematologist in 1 to 2 weeks of discharge.   5. PAF  - Continue Eliquis and metoprolol     6. Leukocytosis; thrombocytosis - chronic - Appears chronic and stable, no apparent infection, recent procalcitonin levels were low, and she has been advised to follow-up with hematology, most of the changes are chronic, stable procalcitonin and CRP.   7.  Hyponatremia  -Improved with hydration monitor intermittently at SNF.   8.  Chronic pain and chronic heavy baseline narcotic use.  On high doses of chronic Dilaudid, dose adjusted for better control on 06/01/2022.  Will request PCP to gradually taper off high-dose narcotic use for chronic pain.  Complains of some generalized abdominal pain on 06/02/2022, stable CT abdomen pelvis with oral contrast.  Bladder scan stable.  Acute pain has resolved chronic pain in good control with her baseline Dilaudid.   9.  HTN - on Norvasc, Coreg, hydralazine added for better control.  Continue to monitor at SNF.   10. Insulin-dependent DM  - A1c was 10.1% in January 2024  -Insulin regimen adjusted, will request SNF staff to monitor CBGs closely q. ACH S and adjust insulin dose as needed, she is on steroid taper and insulin dose will  fluctuate.   Discharge diagnosis     Principal Problem:   Acute renal failure superimposed on stage 4 chronic kidney disease (HCC) Active Problems:   Type 2 diabetes mellitus with hyperglycemia, with long-term current use of insulin (HCC)   Spinal stenosis, lumbar region with neurogenic claudication   PAF (paroxysmal atrial fibrillation) (HCC)   Essential hypertension, benign   Malignant carcinoid tumor of duodenum (HCC)   Normocytic anemia   Acute encephalopathy   Hyponatremia    Discharge instructions    Discharge Instructions     Discharge instructions   Complete by: As directed    Follow with Primary MD Cena Benton, MD in 3-4 days   Get CBC, CMP, TSH, uric acid level, magnesium-  checked next visit with your SNF MD   Activity: As tolerated with Full fall precautions use walker/cane & assistance as needed  Disposition SNF  Diet: Heart Healthy low carbohydrate diet, strict 1.5 L fluid restriction per day.  Check CBGs q. [place].  Special Instructions: If you have smoked or chewed Tobacco  in the last 2 yrs please stop smoking, stop any regular Alcohol  and or any Recreational drug use.  On your next visit with your primary care physician please Get Medicines reviewed and adjusted.  Please request your Prim.MD to go over all Hospital Tests and Procedure/Radiological results at the follow up, please get all Hospital records sent to your Prim MD by signing hospital release before you go home.  If you experience worsening of your admission symptoms, develop shortness of breath, life threatening emergency, suicidal or homicidal thoughts you must seek medical attention immediately by calling 911 or calling your MD immediately  if symptoms less severe.  You Must read complete instructions/literature along with all the possible adverse reactions/side effects for all the Medicines you take and that have been prescribed to you. Take any new Medicines after you have  completely understood and accpet all the possible adverse reactions/side effects.   Increase activity slowly   Complete by: As directed        Discharge Medications   Allergies as of 06/04/2022       Reactions   Diazepam [name]   Gabapentin [name], Swelling   Other reaction(s): Unknown   Iodinated Contrast Media Anaphylaxis   11/29/17 Cardiac arrest 1 min after IV contrast, possible allergy vs vasovagal episode Iopamidol  Anaphylaxis  High 11/28/2017  Patient had seizure like activity and then code post 100 cc of isovue 300     Isovue [iopamidol] Anaphylaxis   11/28/17 Patient had seizure like activity and then 1 min code after 100 cc of isovue 300. Possible contrast allergy vs vasovagal episode   Lisinopril Anaphylaxis   Tongue and mouth swelling   Metoclopramide Other (See Comments)   Tardive dyskinesia Also known as Reglan    Nsaids Anaphylaxis, Other (See Comments)   ULCER   Penicillins Palpitations   Has patient had a PCN reaction causing immediate rash, facial/tongue/throat swelling, SOB or lightheadedness with hypotension: Yes, heart races Has patient had a PCN reaction causing severe rash involving mucus membranes or skin necrosis: No Has patient had a PCN reaction that required hospitalization: Yes  Has patient had a PCN reaction occurring within the last 10 years: No   Tolmetin Nausea Only, Other (See Comments)   ULCER   Dicyclomine Other (See Comments)   Chest pain   Acetaminophen Nausea Only, Other (See Comments)   Irritates stomach ulcer; Abdominal pain   Cyclobenzaprine Palpitations   Oxycodone Palpitations   Rifamycins Palpitations   Tramadol Nausea And Vomiting        Medication List     STOP taking these medications    metoprolol succinate 50 MG 24 hr tablet Commonly known as: TOPROL-XL       TAKE these medications    albuterol (2.5 MG/3ML) 0.083% nebulizer solution Commonly known as: PROVENTIL Take 3 mLs (2.5 mg  total) by nebulization every 6 (six) hours as needed for wheezing or shortness of breath.   allopurinol 100 MG tablet Commonly known as: ZYLOPRIM Take 0.5 tablets (50 mg total) by mouth daily.   amLODipine 10 MG tablet Commonly known as: NORVASC Take 1 tablet (10 mg total) by  mouth daily.   apixaban 5 MG Tabs tablet Commonly known as: ELIQUIS Take 5 mg by mouth 2 (two) times daily.   atorvastatin 10 MG tablet Commonly known as: LIPITOR Take 10 mg by mouth daily.   carvedilol 12.5 MG tablet Commonly known as: COREG Take 1 tablet (12.5 mg total) by mouth 2 (two) times daily with a meal.   cetirizine 10 MG tablet Commonly known as: ZYRTEC TAKE 1 TABLET (10 MG TOTAL) BY MOUTH DAILY (AM) What changed: See the new instructions.   colchicine 0.6 MG tablet Take 0.5 tablets (0.3 mg total) by mouth daily.   diclofenac Sodium 1 % Gel Commonly known as: VOLTAREN Apply 4 g topically 4 (four) times daily.   donepezil 5 MG tablet Commonly known as: ARICEPT Take 1 tablet (5 mg total) by mouth at bedtime.   Easy Comfort Pen Needles 31G X 5 MM Misc Generic drug: Insulin Pen Needle USE 3 TIMES A DAY FOR INSULIN ADMINISTRATION   famotidine 20 MG tablet Commonly known as: PEPCID Take 1 tablet (20 mg total) by mouth daily.   febuxostat 40 MG tablet Commonly known as: ULORIC Take 1 tablet (40 mg total) by mouth daily.   ferrous sulfate 325 (65 FE) MG tablet Take 1 tablet (325 mg total) by mouth 2 (two) times daily with a meal.   fluticasone 50 MCG/ACT nasal spray Commonly known as: FLONASE Place 2 sprays into both nostrils daily as needed for allergies or rhinitis. What changed:  how much to take reasons to take this   folic acid 1 MG tablet Commonly known as: FOLVITE Take 1 mg by mouth daily.   hydrALAZINE 50 MG tablet Commonly known as: APRESOLINE Take 1 tablet (50 mg total) by mouth every 8 (eight) hours.   HYDROmorphone 2 MG tablet Commonly known as: DILAUDID Take 1  tablet (2 mg total) by mouth every 8 (eight) hours as needed for up to 3 days for severe pain.   insulin aspart 100 UNIT/ML injection Commonly known as: novoLOG Inject 5 Units into the skin 3 (three) times daily with meals. What changed:  how much to take how to take this when to take this additional instructions Another medication with the same name was removed. Continue taking this medication, and follow the directions you see here.   insulin glargine-yfgn 100 UNIT/ML injection Commonly known as: SEMGLEE Inject 0.2 mLs (20 Units total) into the skin 2 (two) times daily. What changed:  how much to take when to take this   insulin lispro 100 UNIT/ML KwikPen Commonly known as: HUMALOG Before each meal 3 times a day, 140-199 - 2 units, 200-250 - 6 units, 251-299 - 8 units,  300-349 - 12 units,  350 or above 14 units. What changed:  how much to take how to take this when to take this additional instructions   mirtazapine 15 MG disintegrating tablet Commonly known as: REMERON SOL-TAB Take 1 tablet (15 mg total) by mouth at bedtime.   multivitamin with minerals Tabs tablet Take 1 tablet by mouth daily.   nystatin powder Generic drug: nystatin Apply 1 Application topically daily. Apply to under both breast topically.   ondansetron 4 MG disintegrating tablet Commonly known as: Zofran ODT Take 1 tablet (4 mg total) by mouth every 8 (eight) hours as needed for nausea or vomiting.   pantoprazole 40 MG tablet Commonly known as: PROTONIX TAKE 1 TABLET BY MOUTH 2 (TWO) TIMES DAILY. (AM+BEDTIME) What changed: See the new instructions.   predniSONE  5 MG tablet Commonly known as: DELTASONE take 8 Pills PO for 4 days, 6 Pills PO for 4 days, 4 Pills PO for 4 days, 2 Pills PO for 3 days, 1 Pills PO for 3 days, 1/2 Pill  PO for 3 days then STOP.   Senna Plus 8.6-50 MG tablet Generic drug: senna-docusate Take 1 tablet by mouth daily.   sodium bicarbonate 650 MG tablet Take 1  tablet (650 mg total) by mouth 2 (two) times daily.   torsemide 20 MG tablet Commonly known as: DEMADEX Take 1 tablet (20 mg total) by mouth daily. What changed:  medication strength how much to take when to take this   Vitamin D (Ergocalciferol) 1.25 MG (50000 UNIT) Caps capsule Commonly known as: DRISDOL Take 50,000 Units by mouth every Monday.         Follow-up Information     Cena Benton, MD. Schedule an appointment as soon as possible for a visit in 1 week(s).   Specialty: Internal Medicine Why: And with your psychiatrist within a week of discharge Contact information: [id number] [place] [address] [phone]                 Major procedures and Radiology Reports - PLEASE review detailed and final reports thoroughly  -        CT ABDOMEN PELVIS WO CONTRAST  Result Date: 06/02/2022 CLINICAL DATA:  Abdominal pain.  Stage 4 kidney disease. EXAM: CT ABDOMEN AND PELVIS WITHOUT CONTRAST TECHNIQUE: Multidetector CT imaging of the abdomen and pelvis was performed following the standard protocol without IV contrast. RADIATION DOSE REDUCTION: This exam was performed according to the departmental dose-optimization program which includes automated exposure control, adjustment of the mA and/or kV according to patient size and/or use of iterative reconstruction technique. COMPARISON:  CT, 05/10/2022. FINDINGS: Lower chest: Clear lung bases. Hepatobiliary: Liver normal in size. No liver mass. Status post cholecystectomy. No bile duct dilation. Pancreas: Unremarkable. No pancreatic ductal dilatation or surrounding inflammatory changes. Spleen: Normal in size without focal abnormality. Adrenals/Urinary Tract: Adrenal glands are unremarkable. Kidneys are normal, without renal calculi, focal lesion, or hydronephrosis. Bladder is unremarkable. Stomach/Bowel: Minimal hiatal hernia. Stomach otherwise unremarkable. Small bowel and colon are normal in  caliber. No wall thickening. No inflammation. Normal appendix. Vascular/Lymphatic: Minimal aortic atherosclerotic calcification. No aneurysm. No enlarged lymph nodes. Reproductive: Pedunculated fibroid from the left uterine fundus, 4.6 cm, unchanged. Uterus otherwise unremarkable. No other adnexal abnormalities. Other: No abdominal wall hernia or abnormality. No abdominopelvic ascites. Musculoskeletal: No fracture or acute finding.  No bone lesion. IMPRESSION: 1. No acute findings within the abdomen or pelvis. No findings to account for the patient's abdominal pain. Electronically Signed   By: Lajean Manes M.D.   On: 06/02/2022 11:33   DG Abd Portable 1V  Result Date: 06/02/2022 CLINICAL DATA:  Constipation EXAM: PORTABLE ABDOMEN - 1 VIEW COMPARISON:  03/17/2022 FINDINGS: Normal bowel gas pattern. No abnormal stool retention. Cholecystectomy clips. No concerning mass effect or calcification. IMPRESSION: Normal bowel gas pattern.  No abnormal stool retention. Electronically Signed   By: Jorje Guild M.D.   On: 06/02/2022 07:17   US RENAL  Result Date: 05/29/2022 CLINICAL DATA:  Acute kidney injury EXAM: RENAL / URINARY TRACT ULTRASOUND COMPLETE COMPARISON:  03/19/2022 renal ultrasound FINDINGS: Right Kidney: Renal measurements: 11.5 x 5.0 x 5.3 cm = volume: 159 mL. Echogenicity within normal limits. No mass or hydronephrosis visualized. Left Kidney: Renal measurements: 10.5 x 6.1 x 4.8  cm = volume: 161 mL. Echogenicity within normal limits. Nonshadowing echogenic foci are noted in the mid left kidney, but no mass or hydronephrosis visualized. Bladder: Appears normal for degree of bladder distention. Bladder jets were not visualized. Other: Evaluation is somewhat limited by body habitus and overlying bowel gas. IMPRESSION: No significant sonographic abnormality of the kidneys. Electronically Signed   By: Merilyn Baba M.D.   On: 05/29/2022 12:01   EEG adult  Result Date: 05/29/2022 Lora Havens,  MD     05/29/2022  2:06 PM Patient Name: BEYZA SCHONS MRN: [id number] Epilepsy Attending: Lora Havens Referring Physician/Provider: Thurnell Lose, MD Date: 05/29/2022 Duration: 22.35 mins Patient history: 57yo F with ams. EEG to evaluate for seizure Level of alertness: Awake AEDs during EEG study: None Technical aspects: This EEG study was done with scalp electrodes positioned according to the 10-20 International system of electrode placement. Electrical activity was reviewed with band pass filter of 1-70Hz , sensitivity of 7 uV/mm, display speed of 23mm/sec with a 60Hz  notched filter applied as appropriate. EEG data were recorded continuously and digitally stored.  Video monitoring was available and reviewed as appropriate. Description: EEG showed continuous generalized 3 to 6 Hz theta-delta slowing. Generalized periodic discharges with triphasic morphology at  1Hz  were also noted. Hyperventilation and photic stimulation were not performed.   ABNORMALITY - Periodic discharges with triphasic morphology, generalized ( GPDs) - Continuous slow, generalized IMPRESSION: This study is suggestive of moderate diffuse encephalopathy, nonspecific etiology but likely related to toxic-metabolic causes. No seizures or definite epileptiform discharges were seen throughout the recording. Lora Havens   DG Chest Port 1 View  Result Date: 05/29/2022 CLINICAL DATA:  57 year old female with shortness of breath. EXAM: PORTABLE CHEST 1 VIEW COMPARISON:  CT Chest, Abdomen, and Pelvis 05/10/2022, and earlier. FINDINGS: Portable AP upright view at 0553 hours. Lung volumes and mediastinal contours are within normal limits. Visualized tracheal air column is within normal limits. Allowing for portable technique the lungs are clear. No pneumothorax or pleural effusion. Paucity bowel gas in the visible abdomen. No acute osseous abnormality identified. IMPRESSION: Negative portable chest. Electronically Signed   By: Genevie Ann  M.D.   On: 05/29/2022 06:16   CT HEAD WO CONTRAST  Result Date: 05/29/2022 CLINICAL DATA:  Altered mental status EXAM: CT HEAD WITHOUT CONTRAST TECHNIQUE: Contiguous axial images were obtained from the base of the skull through the vertex without intravenous contrast. RADIATION DOSE REDUCTION: This exam was performed according to the departmental dose-optimization program which includes automated exposure control, adjustment of the mA and/or kV according to patient size and/or use of iterative reconstruction technique. COMPARISON:  05/10/2022 FINDINGS: Brain: No evidence of acute infarction, hemorrhage, hydrocephalus, extra-axial collection or mass lesion/mass effect. Mild cortical atrophy suggested. Vascular: Mild intracranial arterial calcifications. Skull: Normal. Negative for fracture or focal lesion. Sinuses/Orbits: No acute finding. Other: Congenital nonunion of the posterior arch of C1. IMPRESSION: No acute intracranial abnormalities. No change since previous study. Electronically Signed   By: Lucienne Capers M.D.   On: 05/29/2022 02:36   VAS [place] LOWER EXTREMITY VENOUS (DVT)  Result Date: 05/16/2022  Lower Venous DVT Study Patient Name:  COTRINA DANZEY  Date of Exam:   05/14/2022 Medical Rec #: [id number]             Accession #:    [id number] Date of Birth: 31-Jan-1966             Patient Gender: F Patient Age:  57 years Exam Location:  [facility] Procedure:      VAS [place] LOWER EXTREMITY VENOUS (DVT) Referring Phys: Hosie Poisson --------------------------------------------------------------------------------  Indications: Pain, and Swelling.  Limitations: Patient pain with probe pressure. Comparison Study: No prior studies. Performing Technologist: Darlin Coco RDMS, RVT  Examination Guidelines: A complete evaluation includes B-mode imaging, spectral Doppler, color Doppler, and power Doppler as needed of all accessible portions of each vessel. Bilateral testing is considered an integral part  of a complete examination. Limited examinations for reoccurring indications may be performed as noted. The reflux portion of the exam is performed with the patient in reverse Trendelenburg.  +---------+---------------+---------+-----------+----------+-------------------+ RIGHT    CompressibilityPhasicitySpontaneityPropertiesThrombus Aging      +---------+---------------+---------+-----------+----------+-------------------+ CFV      Full           Yes      Yes                                      +---------+---------------+---------+-----------+----------+-------------------+ SFJ      Full                                                             +---------+---------------+---------+-----------+----------+-------------------+ FV Prox  Full                                                             +---------+---------------+---------+-----------+----------+-------------------+ FV Mid   Full                                                             +---------+---------------+---------+-----------+----------+-------------------+ FV Distal               Yes      Yes                                      +---------+---------------+---------+-----------+----------+-------------------+ PFV      Full                                                             +---------+---------------+---------+-----------+----------+-------------------+ POP      Full           Yes      Yes                                      +---------+---------------+---------+-----------+----------+-------------------+ PTV      Full                                                             +---------+---------------+---------+-----------+----------+-------------------+  PERO                                                  Not well visualized +---------+---------------+---------+-----------+----------+-------------------+    +----+---------------+---------+-----------+----------+--------------+ LEFTCompressibilityPhasicitySpontaneityPropertiesThrombus Aging +----+---------------+---------+-----------+----------+--------------+ CFV Full           Yes      Yes                                 +----+---------------+---------+-----------+----------+--------------+    Summary: RIGHT: - There is no evidence of deep vein thrombosis in the lower extremity. However, portions of this examination were limited- see technologist comments above.  - No cystic structure found in the popliteal fossa.  LEFT: - No evidence of common femoral vein obstruction.  *See table(s) above for measurements and observations. Electronically signed by Jamelle Haring on 05/16/2022 at 4:19:17 PM.    Final    DG Shoulder Left  Result Date: 05/13/2022 CLINICAL DATA:  Severe left shoulder pain.  No injury. EXAM: LEFT SHOULDER - 2+ VIEW COMPARISON:  None Available. FINDINGS: No acute fracture or dislocation. High-riding humeral head. Mild acromioclavicular and glenohumeral degenerative changes. Soft tissues are unremarkable. IMPRESSION: 1. No acute osseous abnormality. 2. High-riding humeral head suggestive of underlying rotator cuff tear. Electronically Signed   By: Titus Dubin M.D.   On: 05/13/2022 09:24   DG Knee 1-2 Views Right  Result Date: 05/12/2022 CLINICAL DATA:  Knee pain EXAM: RIGHT KNEE - 1-2 VIEW COMPARISON:  None Available. FINDINGS: No fracture or dislocation is seen. Severe tricompartmental degenerative changes, most prominent in the lateral compartment. The visualized soft tissues are unremarkable. No suprapatellar knee joint effusion. IMPRESSION: No fracture or dislocation is seen. Severe tricompartmental degenerative changes, most prominent in the lateral compartment. Electronically Signed   By: Julian Hy M.D.   On: 05/12/2022 17:33   DG Foot Complete Right  Result Date: 05/12/2022 CLINICAL DATA:  Right leg pain EXAM: RIGHT  FOOT COMPLETE - 3+ VIEW COMPARISON:  None Available. FINDINGS: No fracture or dislocation is seen. Mild tibiotalar degenerative changes. Mild degenerative changes of the dorsal midfoot. The visualized soft tissues are unremarkable. IMPRESSION: No fracture or dislocation is seen. Mild degenerative changes, as above. Electronically Signed   By: Julian Hy M.D.   On: 05/12/2022 17:32   DG Tibia/Fibula Right  Result Date: 05/12/2022 CLINICAL DATA:  Right leg pain EXAM: RIGHT TIBIA AND FIBULA - 2 VIEW COMPARISON:  None Available. FINDINGS: No fracture or dislocation is seen. Mild tibiotalar degenerative changes. Visualized soft tissues are within normal limits. IMPRESSION: Negative. Electronically Signed   By: Julian Hy M.D.   On: 05/12/2022 17:32   MR LUMBAR SPINE WO CONTRAST  Result Date: 05/12/2022 CLINICAL DATA:  Low back pain, cauda equina syndrome suspected. Lower extremity weakness. EXAM: MRI LUMBAR SPINE WITHOUT CONTRAST TECHNIQUE: Multiplanar, multisequence MR imaging of the lumbar spine was performed. No intravenous contrast was administered. COMPARISON:  Lumbar spine MRI 03/19/2017. Lumbar spine CT 05/10/2022. FINDINGS: Segmentation: Conventional numbering is assumed with 5 non-rib-bearing, lumbar type vertebral bodies. Alignment: Trace retrolisthesis of L5 on S1. Grade 1 anterolisthesis of L4 on L5, unchanged. Vertebrae: Modic type 1 degenerative endplate marrow signal changes at T11-12. Conus medullaris and cauda equina: Conus extends to the L1-2 level. Conus and cauda equina appear normal. Paraspinal  and other soft tissues: Unremarkable. Disc levels: Moderate degenerative changes of the bilateral sacroiliac joints. T12-L1:  Normal. L1-L2:  Normal. L2-L3:  Normal. L3-L4: Small disc bulge and mild bilateral facet arthropathy. No spinal canal stenosis or neural foraminal narrowing. L4-L5: Anterolisthesis with uncovered disc and bilateral facet arthropathy results in moderate spinal canal  stenosis and severe bilateral neural foraminal narrowing, worse from prior. Severe bilateral facet arthropathy with joint effusions and periarticular edema. L5-S1: Unchanged right eccentric disc bulge and facet arthropathy resulting in severe right and mild left neural foraminal narrowing. No spinal canal stenosis. IMPRESSION: 1. Slight progression of lower lumbar spondylosis since 2019, worst at L4-L5 where grade 1 anterolisthesis and contributes to moderate spinal canal stenosis and severe bilateral neural foraminal narrowing, worse from prior. 2. Severe bilateral L4-5 facet arthropathy with joint effusions and periarticular edema. This can be a cause of back pain. 3. Unchanged severe right neural foraminal narrowing at L5-S1. Electronically Signed   By: Emmit Alexanders M.D.   On: 05/12/2022 11:13   CT CHEST ABDOMEN PELVIS WO CONTRAST  Result Date: 05/10/2022 CLINICAL DATA:  Vomiting, weakness, and abdominal pain. EXAM: CT CHEST, ABDOMEN AND PELVIS WITHOUT CONTRAST TECHNIQUE: Multidetector CT imaging of the chest, abdomen and pelvis was performed following the standard protocol without IV contrast. RADIATION DOSE REDUCTION: This exam was performed according to the departmental dose-optimization program which includes automated exposure control, adjustment of the mA and/or kV according to patient size and/or use of iterative reconstruction technique. COMPARISON:  CT abdomen pelvis dated 03/17/2022. FINDINGS: Evaluation of this exam is limited in the absence of intravenous contrast as well as due to streak artifact caused by patient's arms. CT CHEST FINDINGS Cardiovascular: There is no cardiomegaly or pericardial effusion. Coronary vascular calcification of the LAD and RCA. Mild atherosclerotic calcification of the thoracic aorta. No aneurysmal dilatation. The central pulmonary arteries are grossly unremarkable. Mediastinum/Nodes: No hilar or mediastinal adenopathy. The esophagus is grossly unremarkable. No  mediastinal fluid collection. Lungs/Pleura: The lungs are clear. There is no pleural effusion or pneumothorax. The central airways are patent. Musculoskeletal: Degenerative changes of the spine. No acute osseous pathology. CT ABDOMEN PELVIS FINDINGS No intra-abdominal free air or free fluid. Hepatobiliary: The liver is grossly unremarkable but suboptimally visualized due to streak artifact caused by patient's arms. Cholecystectomy. Pancreas: Unremarkable. No pancreatic ductal dilatation or surrounding inflammatory changes. Spleen: Normal in size without focal abnormality. Adrenals/Urinary Tract: The adrenal glands are unremarkable. There is no hydronephrosis or nephrolithiasis on either side. The visualized ureters and urinary bladder appear unremarkable. Stomach/Bowel: There is no bowel obstruction or active inflammation. The appendix is normal. Vascular/Lymphatic: Mild atherosclerotic calcification of the abdominal aorta. The IVC is unremarkable. No portal venous gas. There is no adenopathy. Reproductive: The uterus is anteverted. Similar appearance of a 4 cm left uterine fundus exophytic fibroid. Other: None Musculoskeletal: Degenerative changes of the spine. No acute osseous pathology. IMPRESSION: 1. No acute intrathoracic, abdominal, or pelvic pathology. 2. No bowel obstruction. Normal appendix. 3.  Aortic Atherosclerosis (ICD10-I70.0). Electronically Signed   By: Anner Crete M.D.   On: 05/10/2022 23:32   CT L-SPINE NO CHARGE  Result Date: 05/10/2022 CLINICAL DATA:  Right-sided pain and generalized weakness. EXAM: CT THORACIC AND LUMBAR SPINE WITHOUT CONTRAST TECHNIQUE: Multidetector CT imaging of the thoracic and lumbar spine was performed without contrast. Multiplanar CT image reconstructions were also generated. RADIATION DOSE REDUCTION: This exam was performed according to the departmental dose-optimization program which includes automated exposure control, adjustment of the mA  and/or kV according  to patient size and/or use of iterative reconstruction technique. COMPARISON:  CTA chest abdomen pelvis dated 05/10/2022 and CT abdomen pelvis dated 03/17/2022. FINDINGS: CT THORACIC SPINE FINDINGS Alignment: No acute subluxation. Vertebrae: No acute fracture. Paraspinal and other soft tissues: Negative. Disc levels: No acute findings. Multilevel degenerative changes most prominent at T7-T8 with disc space narrowing, endplate irregularity and spurring/osteophyte. CT LUMBAR SPINE FINDINGS Segmentation: 5 lumbar type vertebrae. Alignment: No acute subluxation.  Grade 1 L4-L5 anterolisthesis. Vertebrae: No acute fracture. Paraspinal and other soft tissues: Negative. Disc levels: No acute findings. Degenerative changes primarily at L5-S1 with anterior osteophyte. IMPRESSION: 1. No acute/traumatic thoracic or lumbar spine pathology. 2. Degenerative changes. Electronically Signed   By: Anner Crete M.D.   On: 05/10/2022 23:25   CT T-SPINE NO CHARGE  Result Date: 05/10/2022 CLINICAL DATA:  Right-sided pain and generalized weakness. EXAM: CT THORACIC AND LUMBAR SPINE WITHOUT CONTRAST TECHNIQUE: Multidetector CT imaging of the thoracic and lumbar spine was performed without contrast. Multiplanar CT image reconstructions were also generated. RADIATION DOSE REDUCTION: This exam was performed according to the departmental dose-optimization program which includes automated exposure control, adjustment of the mA and/or kV according to patient size and/or use of iterative reconstruction technique. COMPARISON:  CTA chest abdomen pelvis dated 05/10/2022 and CT abdomen pelvis dated 03/17/2022. FINDINGS: CT THORACIC SPINE FINDINGS Alignment: No acute subluxation. Vertebrae: No acute fracture. Paraspinal and other soft tissues: Negative. Disc levels: No acute findings. Multilevel degenerative changes most prominent at T7-T8 with disc space narrowing, endplate irregularity and spurring/osteophyte. CT LUMBAR SPINE FINDINGS  Segmentation: 5 lumbar type vertebrae. Alignment: No acute subluxation.  Grade 1 L4-L5 anterolisthesis. Vertebrae: No acute fracture. Paraspinal and other soft tissues: Negative. Disc levels: No acute findings. Degenerative changes primarily at L5-S1 with anterior osteophyte. IMPRESSION: 1. No acute/traumatic thoracic or lumbar spine pathology. 2. Degenerative changes. Electronically Signed   By: Anner Crete M.D.   On: 05/10/2022 23:25   CT Head Wo Contrast  Result Date: 05/10/2022 CLINICAL DATA:  Weakness and pain, initial encounter EXAM: CT HEAD WITHOUT CONTRAST CT CERVICAL SPINE WITHOUT CONTRAST TECHNIQUE: Multidetector CT imaging of the head and cervical spine was performed following the standard protocol without intravenous contrast. Multiplanar CT image reconstructions of the cervical spine were also generated. RADIATION DOSE REDUCTION: This exam was performed according to the departmental dose-optimization program which includes automated exposure control, adjustment of the mA and/or kV according to patient size and/or use of iterative reconstruction technique. COMPARISON:  None Available. FINDINGS: CT HEAD FINDINGS Brain: No evidence of acute infarction, hemorrhage, hydrocephalus, extra-axial collection or mass lesion/mass effect. Mild atrophic changes are noted. Vascular: No hyperdense vessel or unexpected calcification. Skull: Normal. Negative for fracture or focal lesion. Sinuses/Orbits: No acute finding. Other: None. CT CERVICAL SPINE FINDINGS Alignment: Mild straightening of the normal cervical lordosis is noted. Skull base and vertebrae: In cervical segments are well visualized. Vertebral body height is well maintained. No acute fracture or acute facet abnormality is noted. Mild osteophytic changes are noted worst at C5-6. Posterior fusion defect is noted at C1 posteriorly felt to be congenital in nature. The odontoid is within normal limits. Soft tissues and spinal canal: Surrounding soft  tissue structures are within normal limits. Upper chest: Visualized lung apices are within normal limits. Other: None IMPRESSION: CT of the head: No acute intracranial abnormality noted. Mild atrophic changes are noted. CT of the cervical spine: Mild degenerative change without acute abnormality. Electronically Signed   By: Elta Guadeloupe  Lukens M.D.   On: 05/10/2022 23:21   CT Cervical Spine Wo Contrast  Result Date: 05/10/2022 CLINICAL DATA:  Weakness and pain, initial encounter EXAM: CT HEAD WITHOUT CONTRAST CT CERVICAL SPINE WITHOUT CONTRAST TECHNIQUE: Multidetector CT imaging of the head and cervical spine was performed following the standard protocol without intravenous contrast. Multiplanar CT image reconstructions of the cervical spine were also generated. RADIATION DOSE REDUCTION: This exam was performed according to the departmental dose-optimization program which includes automated exposure control, adjustment of the mA and/or kV according to patient size and/or use of iterative reconstruction technique. COMPARISON:  None Available. FINDINGS: CT HEAD FINDINGS Brain: No evidence of acute infarction, hemorrhage, hydrocephalus, extra-axial collection or mass lesion/mass effect. Mild atrophic changes are noted. Vascular: No hyperdense vessel or unexpected calcification. Skull: Normal. Negative for fracture or focal lesion. Sinuses/Orbits: No acute finding. Other: None. CT CERVICAL SPINE FINDINGS Alignment: Mild straightening of the normal cervical lordosis is noted. Skull base and vertebrae: In cervical segments are well visualized. Vertebral body height is well maintained. No acute fracture or acute facet abnormality is noted. Mild osteophytic changes are noted worst at C5-6. Posterior fusion defect is noted at C1 posteriorly felt to be congenital in nature. The odontoid is within normal limits. Soft tissues and spinal canal: Surrounding soft tissue structures are within normal limits. Upper chest: Visualized lung  apices are within normal limits. Other: None IMPRESSION: CT of the head: No acute intracranial abnormality noted. Mild atrophic changes are noted. CT of the cervical spine: Mild degenerative change without acute abnormality. Electronically Signed   By: Inez Catalina M.D.   On: 05/10/2022 23:21    Micro Results    Recent Results (from the past 240 hour(s))  MRSA Next Gen by PCR, Nasal     Status: None   Collection Time: 05/29/22 12:05 PM   Specimen: Nasal Mucosa; Nasal Swab  Result Value Ref Range Status   MRSA by PCR Next Gen NOT DETECTED NOT DETECTED Final    Comment: (NOTE) The GeneXpert MRSA Assay (FDA approved for NASAL specimens only), is one component of a comprehensive MRSA colonization surveillance program. It is not intended to diagnose MRSA infection nor to guide or monitor treatment for MRSA infections. Test performance is not FDA approved in patients less than 89 years old. Performed at [facility], [place] [address] [place], [place] [address]     Today   Subjective    Deborah [name] today has no headache,no chest abdominal pain,no new weakness tingling or numbness, feels much better     Objective   Blood pressure (!) 127/96, pulse 92, temperature 98.1 F (36.7 C), temperature source Oral, resp. rate 16, weight 130.5 kg, last menstrual period 10/10/2012, SpO2 98 %.  No intake or output data in the 24 hours ending 06/04/22 0918  Exam  Awake Alert, No new F.N deficits,    [place].AT,PERRAL Supple Neck,   Symmetrical Chest wall movement, Good air movement bilaterally, CTAB RRR,No Gallops,   +ve B.Sounds, Abd Soft, Non tender,  No Cyanosis, Clubbing or edema    Data Review   Recent Labs  Lab 05/30/22 0357 05/31/22 0726 06/01/22 0522 06/02/22 0633 06/03/22 0432  WBC 12.5* 17.6* 16.7* 20.2* 17.4*  HGB 8.2* 8.0* 9.6* 9.7* 9.6*  HCT 27.3* 25.0* 30.8* 32.0* 31.4*  PLT 420* 503* 562* 559* 480*  MCV 88.3 84.2 84.8 87.0 85.6  MCH 26.5 26.9 26.4 26.4 26.2   MCHC 30.0 32.0 31.2 30.3 30.6  RDW 20.2* 19.9*  19.5* 19.4* 19.1*  LYMPHSABS 1.7 1.9 1.6 1.9 2.0  MONOABS 0.8 1.2* 0.6 1.0 1.0  EOSABS 0.3 0.0 0.0 0.1 0.0  BASOSABS 0.1 0.0 0.0 0.0 0.0    Recent Labs  Lab 05/28/22 1819 05/28/22 1825 05/29/22 1038 05/30/22 0357 05/31/22 0726 06/01/22 0522 06/02/22 0633 06/03/22 0432 06/04/22 0652  NA 128*   < >  --  136 136 139 144 138 132*  K 4.8   < >  --  4.3 3.8 4.1 3.8 3.5 3.6  CL 93*   < >  --  103 100 101 102 102 99  CO2 20*   < >  --  19* 25 25 27 26 23   ANIONGAP 15   < >  --  14 11 13 15 10 10   GLUCOSE 193*   < >  --  141* 281* 236* 187* 206* 150*  BUN 113*   < >  --  98* 81* 68* 49* 43* 47*  CREATININE 4.59*   < >  --  3.20* 2.60* 2.13* 1.92* 2.04* 2.21*  AST 22  --   --  22 16 14* 24 17 10*  ALT 17  --   --  15 15 16 19 16 14   ALKPHOS 91  --   --  74 79 78 86 84 72  BILITOT 0.9  --   --  1.2 0.8 0.6 0.6 0.7 0.4  ALBUMIN 2.4*  --   --  2.1* 2.1* 2.4* 2.5* 2.4* 2.4*  CRP  --    < > 19.6* 14.2* 8.6* 6.1* 2.7* 1.3* 0.7  PROCALCITON  --   --  0.28 0.16 <0.10  --   --   --   --   TSH  --   --  1.976  --   --   --   --   --   --   AMMONIA 19  --   --   --   --   --   --   --   --   BNP  --   --   --  54.7 160.3* 534.9* 628.4* 384.8*  --   MG  --    < >  --  2.1 1.9 1.7 1.5* 1.4* 2.1  CALCIUM 9.2   < >  --  8.8* 9.1 9.4 9.4 8.7* 8.4*   < > = values in this interval not displayed.    Total Time in preparing paper work, data evaluation and todays exam - 35 minutes  Signature  -    Lala Lund M.D on 06/04/2022 at 9:18 AM   -  To page go to [email or web address]

## 2022-06-04 NOTE — TOC Transition Note (Signed)
Transition of Care [facility]) - CM/SW Discharge Note   Patient Details  Name: Deborah [name] MRN: [id number] Date of Birth: 12/31/1965  Transition of Care [facility] CM/SW Contact:  Benard Halsted, LCSW Phone Number: 06/04/2022, 11:40 AM   Clinical Narrative:    Patient will DC to: [facility] Anticipated DC date: 06/04/22 Family notified: Niece, Herbalist by: Corey Harold   Per MD patient ready for DC to [place]. RN to call report prior to discharge [phone] room [phone]-B). RN, patient, patient's family, and facility notified of DC. Discharge Summary and FL2 sent to facility. DC packet on chart including signed script. Ambulance transport requested for patient.   CSW will sign off for now as social work intervention is no longer needed. Please consult [place] again if new needs arise.     Final next level of care: Skilled Nursing Facility Barriers to Discharge: Barriers Resolved   Patient Goals and CMS Choice CMS Medicare.gov Compare Post Acute Care list provided to:: Patient Represenative (must comment) Choice offered to / list presented to : Parent (mother)  Discharge Placement     Existing PASRR number confirmed : 06/04/22          Patient chooses bed at: [facility] Patient to be transferred to facility by: [place] Name of family member notified: Unice Cobble, Niece Patient and family notified of of transfer: 06/04/22  Discharge Plan and Services Additional resources added to the After Visit Summary for   In-house Referral: Clinical Social Work   Post Acute Care Choice: [place]                               Social Determinants of Health (SDOH) Interventions SDOH Screenings   Food Insecurity: Food Insecurity Present (05/29/2022)  Housing: High Risk (05/29/2022)  Transportation Needs: Unmet Transportation Needs (05/29/2022)  Utilities: At Risk (05/29/2022)  Depression (PHQ2-9): Medium Risk (09/16/2021)  Tobacco Use: Low Risk   (05/29/2022)     Readmission Risk Interventions     No data to display

## 2022-06-04 NOTE — Progress Notes (Signed)
Patient's family member called to inform of discharge and transportation arrival.  Report called to [facility] skilled nursing facility, report given to [facility] LPN. All belongings given to patient (phone, charger,  glasses, envelope)

## 2022-06-16 LAB — GLUCOSE, CAPILLARY
Glucose-Capillary: 135 mg/dL — ABNORMAL HIGH (ref 70–99)
Glucose-Capillary: 235 mg/dL — ABNORMAL HIGH (ref 70–99)
Glucose-Capillary: 289 mg/dL — ABNORMAL HIGH (ref 70–99)
Glucose-Capillary: 236 mg/dL — ABNORMAL HIGH (ref 70–99)
Glucose-Capillary: 238 mg/dL — ABNORMAL HIGH (ref 70–99)

## 2022-06-18 ENCOUNTER — Other Ambulatory Visit: Payer: Self-pay

## 2022-06-18 ENCOUNTER — Encounter (HOSPITAL_COMMUNITY): Payer: Self-pay

## 2022-06-18 ENCOUNTER — Emergency Department (HOSPITAL_COMMUNITY): Payer: 59

## 2022-06-18 ENCOUNTER — Inpatient Hospital Stay (HOSPITAL_COMMUNITY)
Admission: EM | Admit: 2022-06-18 | Discharge: 2022-07-05 | DRG: 853 | Disposition: A | Discharge status: Home / Self Care | Payer: 59 | Attending: Family Medicine | Admitting: Family Medicine

## 2022-06-18 DIAGNOSIS — M48061 Spinal stenosis, lumbar region without neurogenic claudication: Secondary | ICD-10-CM | POA: Diagnosis present

## 2022-06-18 DIAGNOSIS — E876 Hypokalemia: Secondary | ICD-10-CM | POA: Diagnosis present

## 2022-06-18 DIAGNOSIS — Z886 Allergy status to analgesic agent status: Secondary | ICD-10-CM

## 2022-06-18 DIAGNOSIS — I700 Atherosclerosis of aorta: Secondary | ICD-10-CM | POA: Diagnosis present

## 2022-06-18 DIAGNOSIS — Z1152 Encounter for screening for COVID-19: Secondary | ICD-10-CM

## 2022-06-18 DIAGNOSIS — K3184 Gastroparesis: Secondary | ICD-10-CM | POA: Diagnosis present

## 2022-06-18 DIAGNOSIS — I4821 Permanent atrial fibrillation: Secondary | ICD-10-CM | POA: Diagnosis present

## 2022-06-18 DIAGNOSIS — F0390 Unspecified dementia without behavioral disturbance: Secondary | ICD-10-CM | POA: Diagnosis present

## 2022-06-18 DIAGNOSIS — I509 Heart failure, unspecified: Secondary | ICD-10-CM

## 2022-06-18 DIAGNOSIS — N186 End stage renal disease: Secondary | ICD-10-CM | POA: Diagnosis present

## 2022-06-18 DIAGNOSIS — N179 Acute kidney failure, unspecified: Secondary | ICD-10-CM

## 2022-06-18 DIAGNOSIS — Z6841 Body Mass Index (BMI) 40.0 and over, adult: Secondary | ICD-10-CM

## 2022-06-18 DIAGNOSIS — J123 Human metapneumovirus pneumonia: Secondary | ICD-10-CM | POA: Diagnosis present

## 2022-06-18 DIAGNOSIS — Z833 Family history of diabetes mellitus: Secondary | ICD-10-CM

## 2022-06-18 DIAGNOSIS — K5730 Diverticulosis of large intestine without perforation or abscess without bleeding: Secondary | ICD-10-CM | POA: Diagnosis present

## 2022-06-18 DIAGNOSIS — J189 Pneumonia, unspecified organism: Secondary | ICD-10-CM

## 2022-06-18 DIAGNOSIS — L89622 Pressure ulcer of left heel, stage 2: Secondary | ICD-10-CM | POA: Diagnosis present

## 2022-06-18 DIAGNOSIS — Z880 Allergy status to penicillin: Secondary | ICD-10-CM

## 2022-06-18 DIAGNOSIS — Z91041 Radiographic dye allergy status: Secondary | ICD-10-CM

## 2022-06-18 DIAGNOSIS — Z993 Dependence on wheelchair: Secondary | ICD-10-CM

## 2022-06-18 DIAGNOSIS — Y95 Nosocomial condition: Secondary | ICD-10-CM | POA: Diagnosis present

## 2022-06-18 DIAGNOSIS — A419 Sepsis, unspecified organism: Principal | ICD-10-CM | POA: Diagnosis present

## 2022-06-18 DIAGNOSIS — E1122 Type 2 diabetes mellitus with diabetic chronic kidney disease: Secondary | ICD-10-CM | POA: Diagnosis present

## 2022-06-18 DIAGNOSIS — D62 Acute posthemorrhagic anemia: Secondary | ICD-10-CM | POA: Diagnosis not present

## 2022-06-18 DIAGNOSIS — Z888 Allergy status to other drugs, medicaments and biological substances status: Secondary | ICD-10-CM

## 2022-06-18 DIAGNOSIS — Z794 Long term (current) use of insulin: Secondary | ICD-10-CM

## 2022-06-18 DIAGNOSIS — I428 Other cardiomyopathies: Secondary | ICD-10-CM | POA: Diagnosis present

## 2022-06-18 DIAGNOSIS — Z885 Allergy status to narcotic agent status: Secondary | ICD-10-CM

## 2022-06-18 DIAGNOSIS — I5032 Chronic diastolic (congestive) heart failure: Secondary | ICD-10-CM | POA: Diagnosis present

## 2022-06-18 DIAGNOSIS — M109 Gout, unspecified: Secondary | ICD-10-CM | POA: Diagnosis present

## 2022-06-18 DIAGNOSIS — L89152 Pressure ulcer of sacral region, stage 2: Secondary | ICD-10-CM | POA: Diagnosis present

## 2022-06-18 DIAGNOSIS — Z79899 Other long term (current) drug therapy: Secondary | ICD-10-CM

## 2022-06-18 DIAGNOSIS — Z9049 Acquired absence of other specified parts of digestive tract: Secondary | ICD-10-CM

## 2022-06-18 DIAGNOSIS — Z8673 Personal history of transient ischemic attack (TIA), and cerebral infarction without residual deficits: Secondary | ICD-10-CM

## 2022-06-18 DIAGNOSIS — E1143 Type 2 diabetes mellitus with diabetic autonomic (poly)neuropathy: Secondary | ICD-10-CM | POA: Diagnosis present

## 2022-06-18 DIAGNOSIS — I132 Hypertensive heart and chronic kidney disease with heart failure and with stage 5 chronic kidney disease, or end stage renal disease: Secondary | ICD-10-CM | POA: Diagnosis present

## 2022-06-18 DIAGNOSIS — Z8249 Family history of ischemic heart disease and other diseases of the circulatory system: Secondary | ICD-10-CM

## 2022-06-18 DIAGNOSIS — Z91199 Patient's noncompliance with other medical treatment and regimen due to unspecified reason: Secondary | ICD-10-CM

## 2022-06-18 DIAGNOSIS — Z713 Dietary counseling and surveillance: Secondary | ICD-10-CM

## 2022-06-18 DIAGNOSIS — E785 Hyperlipidemia, unspecified: Secondary | ICD-10-CM | POA: Diagnosis present

## 2022-06-18 DIAGNOSIS — E871 Hypo-osmolality and hyponatremia: Principal | ICD-10-CM

## 2022-06-18 DIAGNOSIS — D509 Iron deficiency anemia, unspecified: Secondary | ICD-10-CM | POA: Diagnosis present

## 2022-06-18 DIAGNOSIS — R0902 Hypoxemia: Secondary | ICD-10-CM | POA: Diagnosis present

## 2022-06-18 DIAGNOSIS — D631 Anemia in chronic kidney disease: Secondary | ICD-10-CM | POA: Diagnosis present

## 2022-06-18 DIAGNOSIS — E1165 Type 2 diabetes mellitus with hyperglycemia: Secondary | ICD-10-CM | POA: Diagnosis present

## 2022-06-18 DIAGNOSIS — Z7901 Long term (current) use of anticoagulants: Secondary | ICD-10-CM

## 2022-06-18 DIAGNOSIS — M797 Fibromyalgia: Secondary | ICD-10-CM | POA: Diagnosis present

## 2022-06-18 DIAGNOSIS — E872 Acidosis, unspecified: Secondary | ICD-10-CM | POA: Diagnosis present

## 2022-06-18 DIAGNOSIS — G894 Chronic pain syndrome: Secondary | ICD-10-CM | POA: Diagnosis present

## 2022-06-18 LAB — BASIC METABOLIC PANEL
Anion gap: 12 (ref 5–15)
BUN: 91 mg/dL — ABNORMAL HIGH (ref 6–20)
CO2: 15 mmol/L — ABNORMAL LOW (ref 22–32)
Calcium: 8.2 mg/dL — ABNORMAL LOW (ref 8.9–10.3)
Chloride: 105 mmol/L (ref 98–111)
Creatinine, Ser: 4.84 mg/dL — ABNORMAL HIGH (ref 0.44–1.00)
GFR, Estimated: 10 mL/min — ABNORMAL LOW (ref >60)
Glucose, Bld: 242 mg/dL — ABNORMAL HIGH (ref 70–99)
Potassium: 3.2 mmol/L — ABNORMAL LOW (ref 3.5–5.1)
Sodium: 132 mmol/L — ABNORMAL LOW (ref 135–145)

## 2022-06-18 LAB — TROPONIN I (HIGH SENSITIVITY)
Troponin I (High Sensitivity): 42 ng/L — ABNORMAL HIGH (ref <18)
Troponin I (High Sensitivity): 44 ng/L — ABNORMAL HIGH (ref <18)

## 2022-06-18 LAB — CBC
HCT: 23.1 % — ABNORMAL LOW (ref 36.0–46.0)
Hemoglobin: 7.2 g/dL — ABNORMAL LOW (ref 12.0–15.0)
MCH: 26.8 pg (ref 26.0–34.0)
MCHC: 31.2 g/dL (ref 30.0–36.0)
MCV: 85.9 fL (ref 80.0–100.0)
Platelets: 245 10*3/uL (ref 150–400)
RBC: 2.69 MIL/uL — ABNORMAL LOW (ref 3.87–5.11)
RDW: 21.1 % — ABNORMAL HIGH (ref 11.5–15.5)
WBC: 11.2 10*3/uL — ABNORMAL HIGH (ref 4.0–10.5)
nRBC: 0 % (ref 0.0–0.2)

## 2022-06-18 LAB — HEPATIC FUNCTION PANEL
ALT: 31 U/L (ref 0–44)
AST: 18 U/L (ref 15–41)
Albumin: 2.9 g/dL — ABNORMAL LOW (ref 3.5–5.0)
Alkaline Phosphatase: 92 U/L (ref 38–126)
Bilirubin, Direct: <0.1 mg/dL (ref 0.0–0.2)
Total Bilirubin: 0.4 mg/dL (ref 0.3–1.2)
Total Protein: 7 g/dL (ref 6.5–8.1)

## 2022-06-18 LAB — SARS CORONAVIRUS 2 BY RT PCR: SARS Coronavirus 2 by RT PCR: NEGATIVE

## 2022-06-18 LAB — LIPASE, BLOOD: Lipase: 88 U/L — ABNORMAL HIGH (ref 11–51)

## 2022-06-18 LAB — BRAIN NATRIURETIC PEPTIDE: B Natriuretic Peptide: 267.3 pg/mL — ABNORMAL HIGH (ref 0.0–100.0)

## 2022-06-18 MED ORDER — HYDROMORPHONE HCL 1 MG/ML IJ SOLN
1.0000 mg | Freq: Once | INTRAMUSCULAR | Status: AC
  Administered 2022-06-18: 1 mg via INTRAVENOUS
  Filled 2022-06-18: qty 1

## 2022-06-18 MED ORDER — SODIUM CHLORIDE 0.9 % IV SOLN
2.0000 g | Freq: Once | INTRAVENOUS | Status: AC
  Administered 2022-06-18: 2 g via INTRAVENOUS
  Filled 2022-06-18: qty 12.5

## 2022-06-18 MED ORDER — FUROSEMIDE 10 MG/ML IJ SOLN
40.0000 mg | Freq: Once | INTRAMUSCULAR | Status: AC
  Administered 2022-06-18: 40 mg via INTRAVENOUS
  Filled 2022-06-18: qty 4

## 2022-06-18 MED ORDER — VANCOMYCIN HCL IN DEXTROSE 1-5 GM/200ML-% IV SOLN
1000.0000 mg | Freq: Once | INTRAVENOUS | Status: AC
  Administered 2022-06-19: 1000 mg via INTRAVENOUS
  Filled 2022-06-18: qty 200

## 2022-06-18 MED ORDER — MORPHINE SULFATE (PF) 4 MG/ML IV SOLN
4.0000 mg | Freq: Once | INTRAVENOUS | Status: AC
  Administered 2022-06-18: 4 mg via INTRAVENOUS
  Filled 2022-06-18: qty 1

## 2022-06-18 NOTE — ED Notes (Signed)
Lab called to ask for a phlebotomist to come stick pt for [facility] as pt is a difficult stick, lab will send someone down momentary.

## 2022-06-18 NOTE — ED Triage Notes (Signed)
BIB facility member from [facility]. Pt reports a weight gain of 15 lbs overnight and SOB that started overnight. Pt is on torsemide and has not missed any doses.  C/o left sided rib pain.

## 2022-06-18 NOTE — ED Notes (Signed)
Patient transported to X-ray 

## 2022-06-18 NOTE — ED Notes (Signed)
Lab at bedside for BC

## 2022-06-18 NOTE — Progress Notes (Signed)
A consult was received from an ED physician for Cefepime per pharmacy dosing.  The patient's profile has been reviewed for ht/wt/allergies/indication/available labs.    A one time order has been placed for Cefepime 2gm IV.    Further antibiotics/pharmacy consults should be ordered by admitting physician if indicated.                       Thank you, Maryellen Pile, PharmD 06/18/2022  11:17 PM

## 2022-06-18 NOTE — ED Provider Notes (Signed)
[place] EMERGENCY DEPARTMENT AT [facility] Provider Note   [name]: [id number] Arrival date & time: 06/18/22  1731     History  Chief Complaint  Patient presents with   [name]   Cough    Deborah [name] is a 57 y.o. female history of hypertension, A-fib on Eliquis, diastolic heart failure here presenting with shortness of breath and abdominal pain.  Patient states that she was admitted to the hospital recently and was discharged to a rehab facility.  She is supposed to get discharged from rehab today.  She states that she had sudden onset of epigastric pain.  She also has worsening shortness of breath. She gained 15 pounds since yesterday and is compliant with her torsemide 20 mg daily. She states that after she got discharged from the rehab facility she came to the ER.  The history is provided by the patient.       Home Medications Prior to Admission medications   Medication Sig Start Date End Date Taking? Authorizing Provider  albuterol (PROVENTIL) (2.5 MG/3ML) 0.083% nebulizer solution Take 3 mLs (2.5 mg total) by nebulization every 6 (six) hours as needed for wheezing or shortness of breath. 04/06/19   Bing Neighbors, NP  allopurinol (ZYLOPRIM) 100 MG tablet Take 0.5 tablets (50 mg total) by mouth daily. 06/04/22   Leroy Sea, MD  amLODipine (NORVASC) 10 MG tablet Take 1 tablet (10 mg total) by mouth daily. 06/04/22   Leroy Sea, MD  apixaban (ELIQUIS) 5 MG TABS tablet Take 5 mg by mouth 2 (two) times daily. 12/03/21   [provider]  atorvastatin (LIPITOR) 10 MG tablet Take 10 mg by mouth daily. 12/03/21   [provider]  carvedilol (COREG) 12.5 MG tablet Take 1 tablet (12.5 mg total) by mouth 2 (two) times daily with a meal. 06/04/22   Leroy Sea, MD  cetirizine (ZYRTEC) 10 MG tablet TAKE 1 TABLET (10 MG TOTAL) BY MOUTH DAILY (AM) Patient taking differently: Take 10 mg by mouth daily. 06/30/21   Raulkar, Drema Pry, MD  colchicine 0.6 MG tablet Take 0.5 tablets (0.3 mg total) by mouth daily. 06/04/22   Leroy Sea, MD  diclofenac Sodium (VOLTAREN) 1 % GEL Apply 4 g topically 4 (four) times daily. 05/27/22   Kathlen Mody, MD  donepezil (ARICEPT) 5 MG tablet Take 1 tablet (5 mg total) by mouth at bedtime. 06/04/22   Leroy Sea, MD  EASY COMFORT PEN NEEDLES 31G X 5 MM MISC USE 3 TIMES A DAY FOR INSULIN ADMINISTRATION 11/14/19   Meccariello, Solmon Ice, MD  famotidine (PEPCID) 20 MG tablet Take 1 tablet (20 mg total) by mouth daily. 03/22/22 07/20/22  Cora Collum, DO  febuxostat (ULORIC) 40 MG tablet Take 1 tablet (40 mg total) by mouth daily. 06/04/22   Leroy Sea, MD  ferrous sulfate 325 (65 FE) MG tablet Take 1 tablet (325 mg total) by mouth 2 (two) times daily with a meal. 05/27/22   Kathlen Mody, MD  fluticasone (FLONASE) 50 MCG/ACT nasal spray Place 2 sprays into both nostrils daily as needed for allergies or rhinitis. Patient taking differently: Place 1 spray into both nostrils daily as needed for allergies. 12/19/18   Rai, Delene Ruffini, MD  folic acid (FOLVITE) 1 MG tablet Take 1 mg by mouth daily. 04/07/21   [provider]  hydrALAZINE (APRESOLINE) 50 MG tablet Take 1 tablet (50 mg total) by mouth every 8 (eight) hours.  06/04/22   Leroy Sea, MD  insulin aspart (NOVOLOG) 100 UNIT/ML injection Inject 5 Units into the skin 3 (three) times daily with meals. 06/04/22   Leroy Sea, MD  insulin glargine-yfgn (SEMGLEE) 100 UNIT/ML injection Inject 0.2 mLs (20 Units total) into the skin 2 (two) times daily. 06/04/22   Leroy Sea, MD  insulin lispro (HUMALOG) 100 UNIT/ML KwikPen Before each meal 3 times a day, 140-199 - 2 units, 200-250 - 6 units, 251-299 - 8 units,  300-349 - 12 units,  350 or above 14 units. 06/04/22   Leroy Sea, MD  mirtazapine (REMERON SOL-TAB) 15 MG disintegrating tablet Take 1 tablet (15 mg total) by mouth at bedtime. 06/04/22   Leroy Sea, MD  Multiple Vitamin (MULTIVITAMIN WITH MINERALS) TABS tablet Take 1 tablet by mouth daily. 05/28/22   Kathlen Mody, MD  nystatin powder Apply 1 Application topically daily. Apply to under both breast topically.    [provider]  ondansetron (ZOFRAN ODT) 4 MG disintegrating tablet Take 1 tablet (4 mg total) by mouth every 8 (eight) hours as needed for nausea or vomiting. 05/30/21   [name], MD  pantoprazole (PROTONIX) 40 MG tablet TAKE 1 TABLET BY MOUTH 2 (TWO) TIMES DAILY. (AM+BEDTIME) Patient taking differently: Take 40 mg by mouth 2 (two) times daily. 03/11/22   Raulkar, Drema Pry, MD  predniSONE (DELTASONE) 5 MG tablet take 8 Pills PO for 4 days, 6 Pills PO for 4 days, 4 Pills PO for 4 days, 2 Pills PO for 3 days, 1 Pills PO for 3 days, 1/2 Pill  PO for 3 days then STOP. 06/04/22   Leroy Sea, MD  SENNA PLUS 8.6-50 MG tablet Take 1 tablet by mouth daily. 05/19/21   [provider]  sodium bicarbonate 650 MG tablet Take 1 tablet (650 mg total) by mouth 2 (two) times daily. 04/20/21   Lurene Shadow, MD  torsemide (DEMADEX) 20 MG tablet Take 1 tablet (20 mg total) by mouth daily. 06/04/22   Leroy Sea, MD  Vitamin D, Ergocalciferol, (DRISDOL) 1.25 MG (50000 UNIT) CAPS capsule Take 50,000 Units by mouth every Monday. 04/07/21   [provider]      Allergies    Diazepam, Gabapentin, Iodinated contrast media, Isovue [iopamidol], Lisinopril, Metoclopramide, Nsaids, Penicillins, Tolmetin, Dicyclomine, Acetaminophen, Cyclobenzaprine, Oxycodone, Rifamycins, and Tramadol    Review of Systems   Review of Systems  Respiratory:  Positive for cough and shortness of breath.   Gastrointestinal:  Positive for abdominal pain.  All other systems reviewed and are negative.   Physical Exam Updated Vital Signs BP 112/85   Pulse (!) 113   Temp 98.1 F (36.7 C) (Oral)   Resp 17   Ht 5\' 6"  (1.676 m)   Wt (!) 142.9 kg   LMP 10/10/2012   SpO2 100%   BMI 50.84  kg/m  Physical Exam Vitals and nursing note reviewed.  Constitutional:      Comments: Uncomfortable and chronically ill  HENT:     Head: Normocephalic.     Mouth/Throat:     Mouth: Mucous membranes are moist.  Eyes:     Extraocular Movements: Extraocular movements intact.     Pupils: Pupils are equal, round, and reactive to light.  Cardiovascular:     Rate and Rhythm: Normal rate and regular rhythm.  Pulmonary:     Comments: Crackles bilateral lung bases Abdominal:     Palpations: Abdomen is soft.  Comments: Mild diffuse tenderness   Musculoskeletal:        General: Normal range of motion.     Cervical back: Normal range of motion and neck supple.     Comments: 1 + edema bilaterally   Skin:    General: Skin is warm.     Capillary Refill: Capillary refill takes less than 2 seconds.  Neurological:     General: No focal deficit present.     Mental Status: She is oriented to person, place, and time.  Psychiatric:        Mood and Affect: Mood normal.        Behavior: Behavior normal.     ED Results / Procedures / Treatments   Labs (all labs ordered are listed, but only abnormal results are displayed) Labs Reviewed  BASIC METABOLIC PANEL - Abnormal; Notable for the following components:      Result Value   Sodium 132 (*)    Potassium 3.2 (*)    CO2 15 (*)    Glucose, Bld 242 (*)    BUN 91 (*)    Creatinine, Ser 4.84 (*)    Calcium 8.2 (*)    GFR, Estimated 10 (*)    All other components within normal limits  CBC - Abnormal; Notable for the following components:   WBC 11.2 (*)    RBC 2.69 (*)    Hemoglobin 7.2 (*)    HCT 23.1 (*)    RDW 21.1 (*)    All other components within normal limits  BRAIN NATRIURETIC PEPTIDE - Abnormal; Notable for the following components:   B Natriuretic Peptide 267.3 (*)    All other components within normal limits  HEPATIC FUNCTION PANEL - Abnormal; Notable for the following components:   Albumin 2.9 (*)    All other components  within normal limits  LIPASE, BLOOD - Abnormal; Notable for the following components:   Lipase 88 (*)    All other components within normal limits  TROPONIN I (HIGH SENSITIVITY) - Abnormal; Notable for the following components:   Troponin I (High Sensitivity) 42 (*)    All other components within normal limits  TROPONIN I (HIGH SENSITIVITY) - Abnormal; Notable for the following components:   Troponin I (High Sensitivity) 44 (*)    All other components within normal limits  SARS CORONAVIRUS 2 BY RT PCR  URINALYSIS, ROUTINE W REFLEX MICROSCOPIC    EKG EKG Interpretation  Date/Time:  Friday June 18 2022 18:02:30 EDT Ventricular Rate:  109 PR Interval:  177 QRS Duration: 96 QT Interval:  355 QTC Calculation: 478 R Axis:   -42 Text Interpretation: Sinus tachycardia Left axis deviation Abnormal R-wave progression, late transition Artifact in lead(s) I II aVR aVL V1 No significant change since last tracing Confirmed by Richardean Canal [phone]) on 06/18/2022 10:14:09 PM  Radiology DG Chest 2 View  Result Date: 06/18/2022 CLINICAL DATA:  [name] EXAM: CHEST - 2 VIEW COMPARISON:  Chest x-ray dated May 29, 2022 FINDINGS: The heart size and mediastinal contours are within normal limits. New consolidations of the upper and right lower lung. Lateral view is somewhat limited due to arm position. No evidence of pleural effusion or pneumothorax. Advanced degenerative changes of the right shoulder. IMPRESSION: New consolidations of the upper and right lower lung, concerning for infection. Recommend follow-up PA and lateral chest radiograph 6-8 weeks to ensure resolution. Electronically Signed   By: Allegra Lai M.D.   On: 06/18/2022 18:23    Procedures  Procedures    Angiocath insertion Performed by: Richardean Canal  Consent: Verbal consent obtained. Risks and benefits: risks, benefits and alternatives were discussed Time out: Immediately prior to procedure a "time out" was called to  verify the correct patient, procedure, equipment, support staff and site/side marked as required.  Preparation: Patient was prepped and draped in the usual sterile fashion.  Vein Location: R antecube  Ultrasound Guided  Gauge: 20 long   Normal blood return and flush without difficulty Patient tolerance: Patient tolerated the procedure well with no immediate complications.    Medications Ordered in ED Medications  morphine (PF) 4 MG/ML injection 4 mg (4 mg Intravenous Given 06/18/22 2151)  furosemide (LASIX) injection 40 mg (40 mg Intravenous Given 06/18/22 2149)    ED Course/ Medical Decision Making/ A&P                             Medical Decision Making PAZ FUENTES is a 57 y.o. female here with SOB, leg swelling, weight gain and abdominal pain.  Patient was just discharged from rehab today.  Concern for possible CHF exacerbation versus pancreatitis versus renal colic versus gastritis.  Patient has history of CKD.  Plan to get CBC and CMP and BNP and troponin and chest x-ray and urinalysis.  11:14 PM I reviewed patient's labs and independently interpreted imaging studies.  Patient has acute renal failure with creatinine of 4.8.  When she was discharged from the hospital several weeks ago it was 2.2.  Patient also has BNP of 267.  Chest x-ray showed possible pneumonia versus pulmonary edema.  CT abdomen pelvis showed no hydronephrosis but she does have patchy infiltrate in the right lower lobe.  Patient remains persistently tachycardic.  Ordered IV antibiotics and also Lasix for diuresis.  I discussed case with Dr. Annamary Rummage from nephrology.  He states that patient can stay at [facility] long and nephrology will see patient in the morning.   Problems Addressed: AKI (acute kidney injury): acute illness or injury Congestive heart failure, unspecified HF chronicity, unspecified heart failure type: acute illness or injury HCAP (healthcare-associated pneumonia): acute illness or  injury  Amount and/or Complexity of Data Reviewed Labs: ordered. Decision-making details documented in ED Course. Radiology: ordered and independent interpretation performed. Decision-making details documented in ED Course. ECG/medicine tests: ordered and independent interpretation performed. Decision-making details documented in ED Course.  Risk Prescription drug management. Decision regarding hospitalization.    Final Clinical Impression(s) / ED Diagnoses Final diagnoses:  None    Rx / DC Orders ED Discharge Orders     None         Charlynne Pander, MD 06/18/22 2320

## 2022-06-18 NOTE — ED Notes (Signed)
Pt assisted with bedside commode.  

## 2022-06-19 ENCOUNTER — Emergency Department (HOSPITAL_COMMUNITY): Payer: 59

## 2022-06-19 ENCOUNTER — Inpatient Hospital Stay (HOSPITAL_COMMUNITY): Payer: 59

## 2022-06-19 DIAGNOSIS — J123 Human metapneumovirus pneumonia: Secondary | ICD-10-CM | POA: Diagnosis present

## 2022-06-19 DIAGNOSIS — E871 Hypo-osmolality and hyponatremia: Secondary | ICD-10-CM | POA: Diagnosis not present

## 2022-06-19 DIAGNOSIS — J189 Pneumonia, unspecified organism: Secondary | ICD-10-CM | POA: Diagnosis not present

## 2022-06-19 DIAGNOSIS — I132 Hypertensive heart and chronic kidney disease with heart failure and with stage 5 chronic kidney disease, or end stage renal disease: Secondary | ICD-10-CM | POA: Diagnosis present

## 2022-06-19 DIAGNOSIS — L89622 Pressure ulcer of left heel, stage 2: Secondary | ICD-10-CM | POA: Diagnosis present

## 2022-06-19 DIAGNOSIS — N185 Chronic kidney disease, stage 5: Secondary | ICD-10-CM | POA: Diagnosis not present

## 2022-06-19 DIAGNOSIS — L89152 Pressure ulcer of sacral region, stage 2: Secondary | ICD-10-CM | POA: Diagnosis present

## 2022-06-19 DIAGNOSIS — I4821 Permanent atrial fibrillation: Secondary | ICD-10-CM | POA: Diagnosis present

## 2022-06-19 DIAGNOSIS — Y95 Nosocomial condition: Secondary | ICD-10-CM | POA: Diagnosis present

## 2022-06-19 DIAGNOSIS — E1143 Type 2 diabetes mellitus with diabetic autonomic (poly)neuropathy: Secondary | ICD-10-CM | POA: Diagnosis present

## 2022-06-19 DIAGNOSIS — D638 Anemia in other chronic diseases classified elsewhere: Secondary | ICD-10-CM | POA: Diagnosis not present

## 2022-06-19 DIAGNOSIS — E1165 Type 2 diabetes mellitus with hyperglycemia: Secondary | ICD-10-CM | POA: Diagnosis present

## 2022-06-19 DIAGNOSIS — I428 Other cardiomyopathies: Secondary | ICD-10-CM | POA: Diagnosis present

## 2022-06-19 DIAGNOSIS — E1122 Type 2 diabetes mellitus with diabetic chronic kidney disease: Secondary | ICD-10-CM | POA: Diagnosis present

## 2022-06-19 DIAGNOSIS — I700 Atherosclerosis of aorta: Secondary | ICD-10-CM | POA: Diagnosis present

## 2022-06-19 DIAGNOSIS — R609 Edema, unspecified: Secondary | ICD-10-CM | POA: Diagnosis not present

## 2022-06-19 DIAGNOSIS — E872 Acidosis, unspecified: Secondary | ICD-10-CM | POA: Diagnosis present

## 2022-06-19 DIAGNOSIS — I5032 Chronic diastolic (congestive) heart failure: Secondary | ICD-10-CM | POA: Diagnosis present

## 2022-06-19 DIAGNOSIS — F0390 Unspecified dementia without behavioral disturbance: Secondary | ICD-10-CM | POA: Diagnosis present

## 2022-06-19 DIAGNOSIS — N179 Acute kidney failure, unspecified: Secondary | ICD-10-CM | POA: Diagnosis present

## 2022-06-19 DIAGNOSIS — D631 Anemia in chronic kidney disease: Secondary | ICD-10-CM | POA: Diagnosis present

## 2022-06-19 DIAGNOSIS — I509 Heart failure, unspecified: Secondary | ICD-10-CM | POA: Diagnosis not present

## 2022-06-19 DIAGNOSIS — N186 End stage renal disease: Secondary | ICD-10-CM | POA: Diagnosis not present

## 2022-06-19 DIAGNOSIS — Z1152 Encounter for screening for COVID-19: Secondary | ICD-10-CM | POA: Diagnosis not present

## 2022-06-19 DIAGNOSIS — D509 Iron deficiency anemia, unspecified: Secondary | ICD-10-CM | POA: Diagnosis present

## 2022-06-19 DIAGNOSIS — D62 Acute posthemorrhagic anemia: Secondary | ICD-10-CM | POA: Diagnosis not present

## 2022-06-19 DIAGNOSIS — Z794 Long term (current) use of insulin: Secondary | ICD-10-CM | POA: Diagnosis not present

## 2022-06-19 DIAGNOSIS — A419 Sepsis, unspecified organism: Secondary | ICD-10-CM | POA: Diagnosis present

## 2022-06-19 DIAGNOSIS — Z6841 Body Mass Index (BMI) 40.0 and over, adult: Secondary | ICD-10-CM | POA: Diagnosis not present

## 2022-06-19 HISTORY — DX: Pneumonia, unspecified organism: J18.9

## 2022-06-19 LAB — COMPREHENSIVE METABOLIC PANEL
ALT: 28 U/L (ref 0–44)
AST: 14 U/L — ABNORMAL LOW (ref 15–41)
Albumin: 2.7 g/dL — ABNORMAL LOW (ref 3.5–5.0)
Alkaline Phosphatase: 77 U/L (ref 38–126)
Anion gap: 12 (ref 5–15)
BUN: 92 mg/dL — ABNORMAL HIGH (ref 6–20)
CO2: 17 mmol/L — ABNORMAL LOW (ref 22–32)
Calcium: 8.1 mg/dL — ABNORMAL LOW (ref 8.9–10.3)
Chloride: 105 mmol/L (ref 98–111)
Creatinine, Ser: 4.82 mg/dL — ABNORMAL HIGH (ref 0.44–1.00)
GFR, Estimated: 10 mL/min — ABNORMAL LOW (ref >60)
Glucose, Bld: 164 mg/dL — ABNORMAL HIGH (ref 70–99)
Potassium: 3.4 mmol/L — ABNORMAL LOW (ref 3.5–5.1)
Sodium: 134 mmol/L — ABNORMAL LOW (ref 135–145)
Total Bilirubin: 0.8 mg/dL (ref 0.3–1.2)
Total Protein: 6.5 g/dL (ref 6.5–8.1)

## 2022-06-19 LAB — STREP PNEUMONIAE URINARY ANTIGEN: Strep Pneumo Urinary Antigen: NEGATIVE

## 2022-06-19 LAB — URINALYSIS, COMPLETE (UACMP) WITH MICROSCOPIC
Bilirubin Urine: NEGATIVE
Glucose, UA: NEGATIVE mg/dL
Ketones, ur: NEGATIVE mg/dL
Nitrite: NEGATIVE
Protein, ur: 30 mg/dL — AB
Specific Gravity, Urine: 1.01 (ref 1.005–1.030)
pH: 5 (ref 5.0–8.0)

## 2022-06-19 LAB — BASIC METABOLIC PANEL
Anion gap: 13 (ref 5–15)
BUN: 92 mg/dL — ABNORMAL HIGH (ref 6–20)
CO2: 14 mmol/L — ABNORMAL LOW (ref 22–32)
Calcium: 7.9 mg/dL — ABNORMAL LOW (ref 8.9–10.3)
Chloride: 105 mmol/L (ref 98–111)
Creatinine, Ser: 4.89 mg/dL — ABNORMAL HIGH (ref 0.44–1.00)
GFR, Estimated: 10 mL/min — ABNORMAL LOW (ref >60)
Glucose, Bld: 232 mg/dL — ABNORMAL HIGH (ref 70–99)
Potassium: 4.3 mmol/L (ref 3.5–5.1)
Sodium: 132 mmol/L — ABNORMAL LOW (ref 135–145)

## 2022-06-19 LAB — HEMOGLOBIN A1C
Hgb A1c MFr Bld: 8.1 % — ABNORMAL HIGH (ref 4.8–5.6)
Mean Plasma Glucose: 185.77 mg/dL

## 2022-06-19 LAB — RESPIRATORY PANEL BY PCR

## 2022-06-19 LAB — TYPE AND SCREEN: Antibody Screen: NEGATIVE

## 2022-06-19 LAB — GLUCOSE, CAPILLARY
Glucose-Capillary: 175 mg/dL — ABNORMAL HIGH (ref 70–99)
Glucose-Capillary: 209 mg/dL — ABNORMAL HIGH (ref 70–99)

## 2022-06-19 LAB — BPAM RBC
Blood Product Expiration Date: 202405062359
ISSUE DATE / TIME: 202404131048

## 2022-06-19 LAB — CBC
HCT: 22.5 % — ABNORMAL LOW (ref 36.0–46.0)
Hemoglobin: 6.9 g/dL — CL (ref 12.0–15.0)
MCH: 27.2 pg (ref 26.0–34.0)
MCHC: 30.7 g/dL (ref 30.0–36.0)
MCV: 88.6 fL (ref 80.0–100.0)
Platelets: 232 10*3/uL (ref 150–400)
RBC: 2.54 MIL/uL — ABNORMAL LOW (ref 3.87–5.11)
RDW: 21 % — ABNORMAL HIGH (ref 11.5–15.5)
WBC: 12.6 10*3/uL — ABNORMAL HIGH (ref 4.0–10.5)
nRBC: 0 % (ref 0.0–0.2)

## 2022-06-19 LAB — LACTIC ACID, PLASMA: Lactic Acid, Venous: 1.6 mmol/L (ref 0.5–1.9)

## 2022-06-19 LAB — PREPARE RBC (CROSSMATCH)

## 2022-06-19 LAB — TSH: TSH: 1.659 u[IU]/mL (ref 0.350–4.500)

## 2022-06-19 LAB — MRSA NEXT GEN BY PCR, NASAL: MRSA by PCR Next Gen: NOT DETECTED

## 2022-06-19 MED ORDER — COLCHICINE 0.6 MG PO TABS: 0.6000 mg | ORAL_TABLET | Freq: Every day | ORAL | Status: DC

## 2022-06-19 MED ORDER — PANTOPRAZOLE SODIUM 40 MG PO TBEC
40.0000 mg | DELAYED_RELEASE_TABLET | Freq: Two times a day (BID) | ORAL | Status: DC
  Administered 2022-06-19 – 2022-07-05 (×33): 40 mg via ORAL
  Filled 2022-06-19 (×32): qty 1

## 2022-06-19 MED ORDER — SODIUM CHLORIDE 0.9% IV SOLUTION: Freq: Once | INTRAVENOUS | Status: AC

## 2022-06-19 MED ORDER — FUROSEMIDE 10 MG/ML IJ SOLN
20.0000 mg | Freq: Once | INTRAMUSCULAR | Status: AC
  Administered 2022-06-19: 20 mg via INTRAVENOUS
  Filled 2022-06-19: qty 4

## 2022-06-19 MED ORDER — FEBUXOSTAT 40 MG PO TABS
40.0000 mg | ORAL_TABLET | Freq: Every day | ORAL | Status: DC
  Administered 2022-06-19 – 2022-07-05 (×16): 40 mg via ORAL
  Filled 2022-06-19 (×17): qty 1

## 2022-06-19 MED ORDER — ALLOPURINOL 100 MG PO TABS
50.0000 mg | ORAL_TABLET | Freq: Every day | ORAL | Status: DC
  Administered 2022-06-19 – 2022-06-20 (×2): 50 mg via ORAL
  Filled 2022-06-19 (×2): qty 1

## 2022-06-19 MED ORDER — ONDANSETRON HCL 4 MG PO TABS: 4.0000 mg | ORAL_TABLET | Freq: Four times a day (QID) | ORAL | Status: DC | PRN

## 2022-06-19 MED ORDER — FERROUS SULFATE 325 (65 FE) MG PO TABS
325.0000 mg | ORAL_TABLET | Freq: Two times a day (BID) | ORAL | Status: DC
  Administered 2022-06-19 – 2022-07-05 (×29): 325 mg via ORAL
  Filled 2022-06-19 (×29): qty 1

## 2022-06-19 MED ORDER — COLCHICINE 0.3 MG HALF TABLET
0.3000 mg | ORAL_TABLET | Freq: Every day | ORAL | Status: DC
  Administered 2022-06-19 – 2022-06-20 (×2): 0.3 mg via ORAL
  Filled 2022-06-19 (×2): qty 1

## 2022-06-19 MED ORDER — PREDNISONE 20 MG PO TABS
20.0000 mg | ORAL_TABLET | Freq: Every day | ORAL | Status: AC
  Administered 2022-06-20 – 2022-06-22 (×3): 20 mg via ORAL
  Filled 2022-06-19 (×3): qty 1

## 2022-06-19 MED ORDER — VANCOMYCIN HCL 1500 MG/300ML IV SOLN
1500.0000 mg | Freq: Once | INTRAVENOUS | Status: AC
  Administered 2022-06-19: 1500 mg via INTRAVENOUS
  Filled 2022-06-19: qty 300

## 2022-06-19 MED ORDER — FUROSEMIDE 10 MG/ML IJ SOLN
80.0000 mg | Freq: Every day | INTRAMUSCULAR | Status: DC
  Administered 2022-06-19: 80 mg via INTRAVENOUS
  Filled 2022-06-19: qty 8

## 2022-06-19 MED ORDER — SODIUM CHLORIDE 0.9 % IV SOLN
2.0000 g | INTRAVENOUS | Status: DC
  Administered 2022-06-19 – 2022-06-20 (×2): 2 g via INTRAVENOUS
  Filled 2022-06-19 (×2): qty 12.5

## 2022-06-19 MED ORDER — GUAIFENESIN-DM 100-10 MG/5ML PO SYRP
5.0000 mL | ORAL_SOLUTION | ORAL | Status: DC | PRN
  Administered 2022-06-20 – 2022-06-21 (×5): 5 mL via ORAL
  Filled 2022-06-19 (×5): qty 10

## 2022-06-19 MED ORDER — ONDANSETRON HCL 4 MG/2ML IJ SOLN: 4.0000 mg | Freq: Four times a day (QID) | INTRAMUSCULAR | Status: DC | PRN

## 2022-06-19 MED ORDER — VANCOMYCIN VARIABLE DOSE PER UNSTABLE RENAL FUNCTION (PHARMACIST DOSING): Status: DC

## 2022-06-19 MED ORDER — MIRTAZAPINE 15 MG PO TBDP
15.0000 mg | ORAL_TABLET | Freq: Every day | ORAL | Status: DC
  Administered 2022-06-19: 15 mg via ORAL
  Filled 2022-06-19: qty 1

## 2022-06-19 MED ORDER — DONEPEZIL HCL 5 MG PO TABS
5.0000 mg | ORAL_TABLET | Freq: Every day | ORAL | Status: DC
  Administered 2022-06-19 – 2022-07-02 (×14): 5 mg via ORAL
  Filled 2022-06-19 (×13): qty 1

## 2022-06-19 MED ORDER — FOLIC ACID 1 MG PO TABS
1.0000 mg | ORAL_TABLET | Freq: Every day | ORAL | Status: DC
  Administered 2022-06-19 – 2022-07-05 (×17): 1 mg via ORAL
  Filled 2022-06-19 (×16): qty 1

## 2022-06-19 MED ORDER — ALBUTEROL SULFATE (2.5 MG/3ML) 0.083% IN NEBU
2.5000 mg | INHALATION_SOLUTION | Freq: Four times a day (QID) | RESPIRATORY_TRACT | Status: DC | PRN
  Administered 2022-06-22 – 2022-06-24 (×9): 2.5 mg via RESPIRATORY_TRACT
  Filled 2022-06-19 (×10): qty 3

## 2022-06-19 MED ORDER — HYDROMORPHONE HCL 1 MG/ML IJ SOLN
1.0000 mg | INTRAMUSCULAR | Status: DC | PRN
  Administered 2022-06-19 – 2022-06-22 (×21): 1 mg via INTRAVENOUS
  Filled 2022-06-19 (×21): qty 1

## 2022-06-19 MED ORDER — ATORVASTATIN CALCIUM 10 MG PO TABS
10.0000 mg | ORAL_TABLET | Freq: Every day | ORAL | Status: DC
  Administered 2022-06-19 – 2022-07-05 (×17): 10 mg via ORAL
  Filled 2022-06-19 (×18): qty 1

## 2022-06-19 MED ORDER — CARVEDILOL 12.5 MG PO TABS
12.5000 mg | ORAL_TABLET | Freq: Two times a day (BID) | ORAL | Status: DC
  Administered 2022-06-19 – 2022-07-05 (×29): 12.5 mg via ORAL
  Filled 2022-06-19 (×30): qty 1

## 2022-06-19 MED ORDER — APIXABAN 5 MG PO TABS
5.0000 mg | ORAL_TABLET | Freq: Two times a day (BID) | ORAL | Status: DC
  Administered 2022-06-19 – 2022-06-27 (×15): 5 mg via ORAL
  Filled 2022-06-19 (×15): qty 1

## 2022-06-19 MED ORDER — ALBUTEROL SULFATE (2.5 MG/3ML) 0.083% IN NEBU: 2.5000 mg | INHALATION_SOLUTION | RESPIRATORY_TRACT | Status: DC | PRN

## 2022-06-19 MED ORDER — HEPARIN SODIUM (PORCINE) 5000 UNIT/ML IJ SOLN
5000.0000 [IU] | Freq: Three times a day (TID) | INTRAMUSCULAR | Status: DC
  Administered 2022-06-19: 5000 [IU] via SUBCUTANEOUS
  Filled 2022-06-19: qty 1

## 2022-06-19 MED ORDER — FENTANYL CITRATE PF 50 MCG/ML IJ SOSY
25.0000 ug | PREFILLED_SYRINGE | INTRAMUSCULAR | Status: DC | PRN
  Administered 2022-06-19: 25 ug via INTRAVENOUS
  Filled 2022-06-19: qty 1

## 2022-06-19 NOTE — Progress Notes (Signed)
Blood restarted at 1947 after new IV placed. Vital signs are WNL. Patient awake, alert, and oriented. No s/s of adverse reaction noted. No bubbles noted in unit of blood.

## 2022-06-19 NOTE — ED Notes (Signed)
Breakfast tray given. °

## 2022-06-19 NOTE — Consult Note (Signed)
Deborah [name] Admit Date: 06/18/2022 06/19/2022 Arita Miss Requesting Physician:  Maisie Fus MD  Reason for Consult:  AoCKD HPI:  88F with complicated history including recent admission 3/22 through 3/29 for AKI, severe hyperuricemia with disseminated gout, encephalopathy; atrial fibrillation on apixaban and metoprolol; history of CKD 4 followed at [facility] with recurrent AKI; history of duodenal carcinoid tumor status postresection, DM2 with history of gastroparesis, chronic pain with baseline narcotic use; hypertension on amlodipine/carvedilol/hydralazine; HFpEF on torsemide daily, chronic anemia with iron deficiency, lumbar spinal stenosis who was discharged after SNF stay earlier today, grabbed some fast food, and then came to the ED for swelling.  Here she complained of edema in the legs, cough productive of yellow phlegm, dyspnea.  Workup included chest imaging with findings of multifocal pneumonia especially on the right side based upon CT and chest x-ray.  Patient afebrile, WBC 11.2.  Started on vancomycin and cefepime, cultures obtained.  Imaging was without evidence of pulmonary edema.  Baseline creatinine appears to be around 1.9-2.2.  Presenting creatinine of 4.8.  CT abdomen with no evidence of hydronephrosis or mass.  Patient received 40 mg IV furosemide in the ED.  Discharge weight on 3/29 was 130.5 kg, weight here today if accurate is 142.9 kg.  Blood pressures are normal.  She is mildly tachycardic.   Creatinine, Ser (mg/dL)  Date Value  76/73/4193 4.89 (H)  06/18/2022 4.84 (H)  06/04/2022 2.21 (H)  06/03/2022 2.04 (H)  06/02/2022 1.92 (H)  06/01/2022 2.13 (H)  05/31/2022 2.60 (H)  05/30/2022 3.20 (H)  05/29/2022 4.44 (H)  05/28/2022 4.59 (H)  ] I/Os: No intake/output data recorded.   ROS NSAIDS: No identified exposure IV Contrast no exposure TMP/SMX no exposure identified Hypotension not present Balance of 12 systems is negative w/ exceptions as  above  PMH  Past Medical History:  Diagnosis Date   Acute back pain with sciatica, left    Acute back pain with sciatica, right    AKI (acute kidney injury)    Anemia, unspecified    Cancer    Carcinoid tumor of duodenum    Chest pain with normal coronary angiography 2019   Chronic kidney disease, stage 3b    Chronic pain    Chronic systolic CHF (congestive heart failure)    Diabetes mellitus    DKA (diabetic ketoacidosis)    Drug-seeking behavior    21 hospitalizations and 14 CT a/p in 2 years for N/V and abdominal pain, demanding only IV dilaudid   Elevated troponin    chronic   Esophageal reflux    Fibromyalgia    Gastric ulcer    Gastroparesis    Gout    Hyperlipidemia    Hypertension    Hypokalemia    Hypomagnesemia    Lumbosacral stenosis    LVH (left ventricular hypertrophy)    Morbid obesity    NICM (nonischemic cardiomyopathy)    PAF (paroxysmal atrial fibrillation)    Stroke 02/2011   Thrombocytosis    Vitamin B12 deficiency anemia    PSH  Past Surgical History:  Procedure Laterality Date   BIOPSY  07/27/2019   Procedure: BIOPSY;  Surgeon: Vida Rigger, MD;  Location: [facility];  Service: Endoscopy;;   BIOPSY  07/30/2019   Procedure: BIOPSY;  Surgeon: Kathi Der, MD;  Location: [facility];  Service: Gastroenterology;;   CATARACT EXTRACTION  01/2014   CHOLECYSTECTOMY     COLONOSCOPY WITH PROPOFOL N/A 07/30/2019   Procedure: COLONOSCOPY WITH PROPOFOL;  Surgeon: Kathi Der,  MD;  Location: [facility];  Service: Gastroenterology;  Laterality: N/A;   ESOPHAGOGASTRODUODENOSCOPY N/A 07/27/2019   Procedure: ESOPHAGOGASTRODUODENOSCOPY (EGD);  Surgeon: Vida Rigger, MD;  Location: [facility];  Service: Endoscopy;  Laterality: N/A;   ESOPHAGOGASTRODUODENOSCOPY N/A 07/26/2020   Procedure: ESOPHAGOGASTRODUODENOSCOPY (EGD);  Surgeon: Willis Modena, MD;  Location: [facility];  Service: Endoscopy;  Laterality: N/A;   ESOPHAGOGASTRODUODENOSCOPY (EGD)  WITH PROPOFOL N/A 08/02/2019   Procedure: ESOPHAGOGASTRODUODENOSCOPY (EGD) WITH PROPOFOL;  Surgeon: Kathi Der, MD;  Location: [facility];  Service: Gastroenterology;  Laterality: N/A;   HEMOSTASIS CLIP PLACEMENT  08/02/2019   Procedure: HEMOSTASIS CLIP PLACEMENT;  Surgeon: Kathi Der, MD;  Location: [facility];  Service: Gastroenterology;;   POLYPECTOMY  07/30/2019   Procedure: POLYPECTOMY;  Surgeon: Kathi Der, MD;  Location: [facility];  Service: Gastroenterology;;   POLYPECTOMY  08/02/2019   Procedure: POLYPECTOMY;  Surgeon: Kathi Der, MD;  Location: [facility];  Service: Gastroenterology;;   FH  Family History  Problem Relation Age of Onset   Diabetes Mother    Diabetes Father    Heart disease Father    Diabetes Sister    Congestive Heart Failure Sister 9   Diabetes Brother    SH  reports that she has never smoked. She has never used smokeless tobacco. She reports that she does not drink alcohol and does not use drugs. Allergies  Allergies  Allergen Reactions   Diazepam [name]   Gabapentin [name] and Swelling    Other reaction(s): Unknown   Iodinated Contrast Media Anaphylaxis    11/29/17 Cardiac arrest 1 min after IV contrast, possible allergy vs vasovagal episode Iopamidol  Anaphylaxis  High 11/28/2017  Patient had seizure like activity and then code post 100 cc of isovue 300     Isovue [Iopamidol] Anaphylaxis    11/28/17 Patient had seizure like activity and then 1 min code after 100 cc of isovue 300. Possible contrast allergy vs vasovagal episode   Lisinopril Anaphylaxis    Tongue and mouth swelling   Metoclopramide Other (See Comments)    Tardive dyskinesia  Also known as Reglan    Nsaids Anaphylaxis and Other (See Comments)    ULCER   Penicillins Palpitations    Has patient had a PCN reaction causing immediate rash, facial/tongue/throat swelling, SOB or lightheadedness with hypotension: Yes, heart  races Has patient had a PCN reaction causing severe rash involving mucus membranes or skin necrosis: No Has patient had a PCN reaction that required hospitalization: Yes  Has patient had a PCN reaction occurring within the last 10 years: No    Tolmetin Nausea Only and Other (See Comments)    ULCER   Dicyclomine Other (See Comments)    Chest pain   Acetaminophen Nausea Only and Other (See Comments)    Irritates stomach ulcer; Abdominal pain   Cyclobenzaprine Palpitations   Oxycodone Palpitations   Rifamycins Palpitations   Tramadol Nausea And Vomiting   Home medications Prior to Admission medications   Medication Sig Start Date End Date Taking? Authorizing Provider  albuterol (PROVENTIL) (2.5 MG/3ML) 0.083% nebulizer solution Take 3 mLs (2.5 mg total) by nebulization every 6 (six) hours as needed for wheezing or shortness of breath. 04/06/19   Bing Neighbors, NP  allopurinol (ZYLOPRIM) 100 MG tablet Take 0.5 tablets (50 mg total) by mouth daily. 06/04/22   Leroy Sea, MD  amLODipine (NORVASC) 10 MG tablet Take 1 tablet (10 mg total) by mouth daily. 06/04/22   Leroy Sea, MD  apixaban (ELIQUIS) 5 MG TABS tablet Take 5 mg by mouth 2 (two) times daily. 12/03/21   [provider]  atorvastatin (LIPITOR) 10 MG tablet Take 10 mg by mouth daily. 12/03/21   [provider]  carvedilol (COREG) 12.5 MG tablet Take 1 tablet (12.5 mg total) by mouth 2 (two) times daily with a meal. 06/04/22   Leroy Sea, MD  cetirizine (ZYRTEC) 10 MG tablet TAKE 1 TABLET (10 MG TOTAL) BY MOUTH DAILY (AM) Patient taking differently: Take 10 mg by mouth daily. 06/30/21   Raulkar, Drema Pry, MD  colchicine 0.6 MG tablet Take 0.5 tablets (0.3 mg total) by mouth daily. 06/04/22   Leroy Sea, MD  diclofenac Sodium (VOLTAREN) 1 % GEL Apply 4 g topically 4 (four) times daily. 05/27/22   Kathlen Mody, MD  donepezil (ARICEPT) 5 MG tablet Take 1 tablet (5 mg total) by mouth at bedtime.  06/04/22   Leroy Sea, MD  EASY COMFORT PEN NEEDLES 31G X 5 MM MISC USE 3 TIMES A DAY FOR INSULIN ADMINISTRATION 11/14/19   Meccariello, Solmon Ice, MD  famotidine (PEPCID) 20 MG tablet Take 1 tablet (20 mg total) by mouth daily. 03/22/22 07/20/22  Cora Collum, DO  febuxostat (ULORIC) 40 MG tablet Take 1 tablet (40 mg total) by mouth daily. 06/04/22   Leroy Sea, MD  ferrous sulfate 325 (65 FE) MG tablet Take 1 tablet (325 mg total) by mouth 2 (two) times daily with a meal. 05/27/22   Kathlen Mody, MD  fluticasone (FLONASE) 50 MCG/ACT nasal spray Place 2 sprays into both nostrils daily as needed for allergies or rhinitis. Patient taking differently: Place 1 spray into both nostrils daily as needed for allergies. 12/19/18   Rai, Delene Ruffini, MD  folic acid (FOLVITE) 1 MG tablet Take 1 mg by mouth daily. 04/07/21   [provider]  hydrALAZINE (APRESOLINE) 50 MG tablet Take 1 tablet (50 mg total) by mouth every 8 (eight) hours. 06/04/22   Leroy Sea, MD  insulin aspart (NOVOLOG) 100 UNIT/ML injection Inject 5 Units into the skin 3 (three) times daily with meals. 06/04/22   Leroy Sea, MD  insulin glargine-yfgn (SEMGLEE) 100 UNIT/ML injection Inject 0.2 mLs (20 Units total) into the skin 2 (two) times daily. 06/04/22   Leroy Sea, MD  insulin lispro (HUMALOG) 100 UNIT/ML KwikPen Before each meal 3 times a day, 140-199 - 2 units, 200-250 - 6 units, 251-299 - 8 units,  300-349 - 12 units,  350 or above 14 units. 06/04/22   Leroy Sea, MD  mirtazapine (REMERON SOL-TAB) 15 MG disintegrating tablet Take 1 tablet (15 mg total) by mouth at bedtime. 06/04/22   Leroy Sea, MD  Multiple Vitamin (MULTIVITAMIN WITH MINERALS) TABS tablet Take 1 tablet by mouth daily. 05/28/22   Kathlen Mody, MD  nystatin powder Apply 1 Application topically daily. Apply to under both breast topically.    [provider]  ondansetron (ZOFRAN ODT) 4 MG disintegrating tablet Take  1 tablet (4 mg total) by mouth every 8 (eight) hours as needed for nausea or vomiting. 05/30/21   [name], MD  pantoprazole (PROTONIX) 40 MG tablet TAKE 1 TABLET BY MOUTH 2 (TWO) TIMES DAILY. (AM+BEDTIME) Patient taking differently: Take 40 mg by mouth 2 (two) times daily. 03/11/22   Raulkar, Drema Pry, MD  predniSONE (DELTASONE) 5 MG tablet take 8 Pills PO for 4 days, 6 Pills PO for 4 days, 4 Pills PO  for 4 days, 2 Pills PO for 3 days, 1 Pills PO for 3 days, 1/2 Pill  PO for 3 days then STOP. 06/04/22   Leroy Sea, MD  SENNA PLUS 8.6-50 MG tablet Take 1 tablet by mouth daily. 05/19/21   [provider]  sodium bicarbonate 650 MG tablet Take 1 tablet (650 mg total) by mouth 2 (two) times daily. 04/20/21   Lurene Shadow, MD  torsemide (DEMADEX) 20 MG tablet Take 1 tablet (20 mg total) by mouth daily. 06/04/22   Leroy Sea, MD  Vitamin D, Ergocalciferol, (DRISDOL) 1.25 MG (50000 UNIT) CAPS capsule Take 50,000 Units by mouth every Monday. 04/07/21   [provider]    Current Medications Scheduled Meds:  heparin  5,000 Units Subcutaneous Q8H   vancomycin variable dose per unstable renal function (pharmacist dosing)   Does not apply See admin instructions   Continuous Infusions:  ceFEPime (MAXIPIME) IV     PRN Meds:.albuterol, HYDROmorphone (DILAUDID) injection, ondansetron **OR** ondansetron (ZOFRAN) IV  CBC Recent Labs  Lab 06/18/22 1817  WBC 11.2*  HGB 7.2*  HCT 23.1*  MCV 85.9  PLT 245   Basic Metabolic Panel Recent Labs  Lab 06/18/22 1817 06/19/22 0240  NA 132* 132*  K 3.2* 4.3  CL 105 105  CO2 15* 14*  GLUCOSE 242* 232*  BUN 91* 92*  CREATININE 4.84* 4.89*  CALCIUM 8.2* 7.9*    Physical Exam  Blood pressure (!) 133/93, pulse (!) 102, temperature 98.6 F (37 C), temperature source Oral, resp. rate (!) 21, height 5\' 6"  (1.676 m), weight (!) 142.9 kg, last menstrual period 10/10/2012, SpO2 96 %. GEN: Chronically ill-appearing, morbidly  obese female, lying flat in bed and comfortably breathing, speaks in full sentences ENT: NCAT EYES: EOMI CV: Regular, tachycardic, no rub, normal S1 and S2 PULM: Diminished in the bases, distant throughout ABD: Mildly tender to palpation throughout even with superficial touch SKIN: Has an unknown ruptured blister over left medial calcaneal region, no other wounds identified EXT:2+ edema in the legs, trace to 1+ in the presacral region  Assessment 59F AoCKD4, multifocal pneumonia, recent admission for disseminated gout with encephalopathy and AKI, just discharged from SNF.  AoCKD4: Has significant GFR mobility.  Imaging at presentation without evidence of obstruction.  [facility]  multifocal pneumonia especially in right lung started vancomycin and cefepime per [facility], cultures pending.  COVID-negative. Lower extremity edema, weight gain.  Clear lungs on imaging for edema.  Uses torsemide as outpatient Hypertension on amlodipine, hydralazine, carvedilol Gout with severe hyperuricemia on prednisone colchicine and allopurinol Anemia, worsening, transfuse per TR[facility] History of HFpEF, normal LVEF DM2 with history of HHS Atrial fibrillation on rate control and apixaban Lumbar spinal stenosis. Chronic pain on narcotics  Plan I do not think that she immediately needs aggressive diuresis.  Though weights are up her lungs are clear and edema is present but not more than 2+ Would treat her pneumonia, as already initiated, trend GFR and labs If necessary can use as needed Lasix but would use at least 80 IV, as above 9 for right now Do not continue home sodium bicarbonate Follow-up ordered urine analysis. Daily weights, Daily Renal Panel, Strict I/Os, Avoid nephrotoxins (NSAIDs, judicious IV Contrast) Will follow along   Arita Miss  06/19/2022, 5:18 AM

## 2022-06-19 NOTE — ED Notes (Signed)
Pt refuse CT scan at this time until she receives pain medication, Dr. Maisie Fus was paged, awaiting call back

## 2022-06-19 NOTE — Progress Notes (Signed)
[facility] PROGRESS NOTE   Deborah [name] CYE:[id number] DOB: [birth year] DOA: 06/18/2022  PCP: Elsie Amis, MD  Brief History/Interval Summary: 57 y.o. female with medical history significant for  insulin-dependent diabetes mellitus with hx of hypertension, PAF on Eliquis, CKD stage IV, iron deficiency anemia, chronic pain, duodenal carcinoid tumor resected in 2021. Patient also has interim history of admission 05/28/22 -06/04/22 with diagnosis of acute metabolic encephalopathy due to dehydration with associated AKI on CKDIV, with course complicated by disseminated gout. S/p treatment patient was discharged on colchine, Uloric  and allopurinol as well as steroid taper. Patient s/p  hospitalization was discharge to SNF. Patient now presents s/p d/c from snf with complaint of worsening sob and swelling over the last 24 hours. Patient also notes cough and left lateral side pain.   Consultants: Nephrology  Procedures: None yet    Subjective/Interval History: Patient complains of pain of the left side of her abdomen.  Denies any nausea vomiting.  Has not been constipated.  Denies any dysuria.  Also complains of swelling of both legs which is worse than normal.  She mentions that she has gained about 15 pounds in the last few days.  Also has shortness of breath with cough but denies any chest pain.    Assessment/Plan:  Pneumonia in the setting of recent hospital admission Imaging studies showed multifocal opacities.  COVID-19 PCR was negative.  She is afebrile.  WBC was noted to be elevated.  She is on broad-spectrum antibiotics.  Will do a respiratory viral panel as well. Follow-up on cultures.  Currently not requiring any oxygen.  Acute kidney injury on chronic kidney disease stage IV/concern for fluid overload/hypokalemia/hyponatremia When she was last hospitalized she was discharged with a creatinine of 2.2.  Came in with a creatinine of 4.8.  Has gained a lot of  weight according to the patient about 15 pounds.  Also has worsening lower extremity edema. Nephrology has been consulted. Patient currently on furosemide 80 mg once a day.  Monitor renal function and electrolytes closely.  Monitor urine output. Echocardiogram from July 2023 showed normal left ventricular systolic function. Fluid overload is likely due to worsening renal function. CT renal study did not show any hydronephrosis. Monitor electrolytes closely. Torsemide on hold.  Sodium bicarbonate on hold.  Anemia of chronic kidney disease/acute on chronic anemia Drop in hemoglobin noted.  When she was discharged last month her hemoglobin was 9.6.  Came in with a hemoglobin of 7.2.  6.9 this morning.  Will be transfused 2 units of PRBC.  No evidence for overt bleeding.  Continue to monitor. Apparently supposed to follow-up with hematology for her longstanding anemia.  Recently check anemia panel in March reviewed.  Left-sided abdominal pain CT renal study did not show any obvious abnormalities.  Diverticulosis was noted but no diverticulitis.  She does not have any significant stool burden.  Will continue to monitor for now.  LFTs were noted to be unremarkable.  Lipase level was noted to be mildly elevated but clinical significance is unclear.  Be due to her renal dysfunction.  She is not experiencing pain in the epigastric area.  Insulin-dependent diabetes mellitus Currently on SSI.  Monitor CBGs.  HbA1c 8.1.  Paroxysmal atrial fibrillation She is on Eliquis and metoprolol at home.  These will need to be resumed.  May need to dose adjust the Eliquis for renal function.  Essential hypertension Blood pressure is reasonably well-controlled.  It appears that she is on amlodipine carvedilol  hydralazine prior to admission.  These are currently on hold.  History of gout with recent disseminated gout Continue allopurinol colchicine and also on Uloric.   Chronic pain syndrome According to the  discharge summary from recent hospitalization she is supposed to be on hydromorphone chronically.  Do not see this listed in the home medication list.  This will need to be reconciled.  History of duodenal carcinoid tumor Status post resection previously.  History of lumbar spinal stenosis Has chronic bilateral lower extremity weakness.  Is wheelchair-bound.  Has been imaged previously and MRI has been discussed with neurosurgery.  Outpatient follow-up was recommended.  Obesity Estimated body mass index is 50.84 kg/m as calculated from the following:   Height as of this encounter: 5\' 6"  (1.676 m).   Weight as of this encounter: 142.9 kg.   DVT Prophylaxis: Eliquis will be resumed Code Status: Full code Family Communication: Discussed with the patient Disposition Plan: To be determined.  She is here from a skilled nursing facility but tells me that she was supposed to be released.  Will involve PT and OT.  Involve TOC.  Status is: Inpatient Remains inpatient appropriate because: Acute kidney injury      Medications: Scheduled:  sodium chloride   Intravenous Once   furosemide  20 mg Intravenous Once   furosemide  80 mg Intravenous Daily   heparin  5,000 Units Subcutaneous Q8H   vancomycin variable dose per unstable renal function (pharmacist dosing)   Does not apply See admin instructions   Continuous:  ceFEPime (MAXIPIME) IV     OJJ:[id number], HYDROmorphone (DILAUDID) injection, ondansetron **OR** ondansetron (ZOFRAN) IV  Antibiotics: Anti-infectives (From admission, onward)    Start     Dose/Rate Route Frequency Ordered Stop   06/19/22 2300  ceFEPIme (MAXIPIME) 2 g in sodium chloride 0.9 % 100 mL IVPB        2 g 200 mL/hr over 30 Minutes Intravenous Every 24 hours 06/19/22 0156     06/19/22 0200  vancomycin (VANCOREADY) IVPB 1500 mg/300 mL        1,500 mg 150 mL/hr over 120 Minutes Intravenous  Once 06/19/22 0156 06/19/22 0622   06/19/22 0157  vancomycin variable dose  per unstable renal function (pharmacist dosing)         Does not apply See admin instructions 06/19/22 0157     06/18/22 2330  ceFEPIme (MAXIPIME) 2 g in sodium chloride 0.9 % 100 mL IVPB        2 g 200 mL/hr over 30 Minutes Intravenous  Once 06/18/22 2317 06/19/22 0007   06/18/22 2315  vancomycin (VANCOCIN) IVPB 1000 mg/200 mL premix        1,000 mg 200 mL/hr over 60 Minutes Intravenous  Once 06/18/22 2306 06/19/22 0203       Objective:  Vital Signs  Vitals:   06/19/22 0237 06/19/22 0500 06/19/22 0527 06/19/22 0630  BP:  131/66  119/79  Pulse:  (!) 109  (!) 109  Resp:  (!) 21  (!) 21  Temp: 98.6 F (37 C)  98.4 F (36.9 C)   TempSrc: Oral  Oral   SpO2:  99%  99%  Weight:      Height:        Intake/Output Summary (Last 24 hours) at 06/19/2022 0835 Last data filed at 06/19/2022 9371 Gross per 24 hour  Intake 601 ml  Output 320 ml  Net 281 ml   Filed Weights   06/18/22 1755  Weight: (!) 142.9 kg  General appearance: Awake alert.  In no distress Resp: Mildly With coarse breath sounds.  Crackles bilateral bases.  No wheezing or rhonchi. Cardio: S1-S2 is normal regular.  No S3-S4.  No rubs murmurs or bruit GI: Abdomen is soft.  Nontender nondistended.  Bowel sounds are present normal.  No masses organomegaly Extremities: 2+ edema bilateral lower extremities Neurologic: Chronic lower extremity weakness.  No other focal deficits.   Lab Results:  Data Reviewed: I have personally reviewed following labs and reports of the imaging studies  CBC: Recent Labs  Lab 06/18/22 1817 06/19/22 0517  WBC 11.2* 12.6*  HGB 7.2* 6.9*  HCT 23.1* 22.5*  MCV 85.9 88.6  PLT 245 232    Basic Metabolic Panel: Recent Labs  Lab 06/18/22 1817 06/19/22 0240 06/19/22 0517  NA 132* 132* 134*  K 3.2* 4.3 3.4*  CL 105 105 105  CO2 15* 14* 17*  GLUCOSE 242* 232* 164*  BUN 91* 92* 92*  CREATININE 4.84* 4.89* 4.82*  CALCIUM 8.2* 7.9* 8.1*    GFR: Estimated Creatinine  Clearance: 18.6 mL/min (A) (by C-G formula based on SCr of 4.82 mg/dL (H)).  Liver Function Tests: Recent Labs  Lab 06/18/22 2132 06/19/22 0517  AST 18 14*  ALT 31 28  ALKPHOS 92 77  BILITOT 0.4 0.8  PROT 7.0 6.5  ALBUMIN 2.9* 2.7*    Recent Labs  Lab 06/18/22 2132  LIPASE 88*    HbA1C: Recent Labs    06/19/22 0240  HGBA1C 8.1*    Thyroid Function Tests: Recent Labs    06/19/22 0240  TSH 1.659    Recent Results (from the past 240 hour(s))  SARS Coronavirus 2 by RT PCR (hospital order, performed in [facility] hospital lab) *cepheid single result test* Anterior Nasal Swab     Status: None   Collection Time: 06/18/22  9:16 PM   Specimen: Anterior Nasal Swab  Result Value Ref Range Status   SARS Coronavirus 2 by RT PCR NEGATIVE NEGATIVE Final    Comment: (NOTE) SARS-CoV-2 target nucleic acids are NOT DETECTED.  The SARS-CoV-2 RNA is generally detectable in upper and lower respiratory specimens during the acute phase of infection. The lowest concentration of SARS-CoV-2 viral copies this assay can detect is 250 copies / mL. A negative result does not preclude SARS-CoV-2 infection and should not be used as the sole basis for treatment or other patient management decisions.  A negative result may occur with improper specimen collection / handling, submission of specimen other than nasopharyngeal swab, presence of viral mutation(s) within the areas targeted by this assay, and inadequate number of viral copies (<250 copies / mL). A negative result must be combined with clinical observations, patient history, and epidemiological information.  Fact Sheet for Patients:   RoadLapTop.co.za  Fact Sheet for Healthcare Providers: http://kim-miller.com/  This test is not yet approved or  cleared by the [place] FDA and has been authorized for detection and/or diagnosis of SARS-CoV-2 by FDA under an Emergency Use  Authorization (EUA).  This EUA will remain in effect (meaning this test can be used) for the duration of the COVID-19 declaration under Section 564(b)(1) of the Act, 21 U.S.C. section 360bbb-3(b)(1), unless the authorization is terminated or revoked sooner.  Performed at [facility], [address] [place], [place] [address]       Radiology Studies: CT CHEST WO CONTRAST  Result Date: 06/19/2022 CLINICAL DATA:  Abnormal x-ray, lung opacities. [name] and cough. EXAM: CT CHEST  WITHOUT CONTRAST TECHNIQUE: Multidetector CT imaging of the chest was performed following the standard protocol without IV contrast. RADIATION DOSE REDUCTION: This exam was performed according to the departmental dose-optimization program which includes automated exposure control, adjustment of the mA and/or kV according to patient size and/or use of iterative reconstruction technique. COMPARISON:  05/10/2022. FINDINGS: Cardiovascular: The heart is normal in size and there is no pericardial effusion. A few scattered coronary artery calcifications are noted. The aorta is normal in caliber. The pulmonary trunk is distended suggesting underlying pulmonary artery hypertension. Mediastinum/Nodes: No mediastinal or axillary lymphadenopathy. Evaluation of the hila is limited due to lack of IV contrast. The thyroid gland, trachea, and esophagus are within normal limits. There is a small hiatal hernia. Lungs/Pleura: Patchy infiltrates are noted in the right lower lobe and there is consolidation in the right upper lobe. A few opacities are noted in the left upper lobe. No effusion or pneumothorax. Upper Abdomen: The gallbladder is surgically absent. No acute abnormality. Musculoskeletal: Degenerative changes are present in the thoracic spine. No acute osseous abnormality. IMPRESSION: 1. Findings suggestive of multifocal pneumonia, most pronounced in the right lung. 2. Coronary artery calcifications. 3.  Aortic atherosclerosis. Electronically Signed   By: Thornell Sartorius M.D.   On: 06/19/2022 04:30   CT Renal Stone Study  Result Date: 06/18/2022 CLINICAL DATA:  Diffuse abdominal pain, difficulty breathing. EXAM: CT ABDOMEN AND PELVIS WITHOUT CONTRAST TECHNIQUE: Multidetector CT imaging of the abdomen and pelvis was performed following the standard protocol without IV contrast. RADIATION DOSE REDUCTION: This exam was performed according to the departmental dose-optimization program which includes automated exposure control, adjustment of the mA and/or kV according to patient size and/or use of iterative reconstruction technique. COMPARISON:  06/02/2022. FINDINGS: Lower chest: A few coronary artery calcifications are noted. Patchy infiltrates are noted in the right lower lobe, concerning for pneumonia. Hepatobiliary: No focal liver abnormality is seen. Status post cholecystectomy. No biliary dilatation. Pancreas: Unremarkable. No pancreatic ductal dilatation or surrounding inflammatory changes. Spleen: Normal in size without focal abnormality. Adrenals/Urinary Tract: The adrenal glands are within normal limits. No renal calculus or hydronephrosis bilaterally. The bladder is unremarkable. Stomach/Bowel: There is a small hiatal hernia. Stomach is within normal limits. Appendix is not definitely seen. No evidence of bowel wall thickening, distention, or inflammatory changes. No free air or pneumatosis. A few scattered diverticula are present along the colon without evidence of diverticulitis. Vascular/Lymphatic: Aortic atherosclerosis. A nonspecific prominent lymph node is present in the retroperitoneum on the left at the level of the left kidney measuring 1 cm. Reproductive: There is a stable pedunculated fibroid extending from the uterus on the left. No adnexal mass. Other: No abdominopelvic ascites. Musculoskeletal: Degenerative changes are present in the thoracolumbar spine. No acute osseous abnormality.  IMPRESSION: 1. Patchy infiltrates in the right lower lobe, suspicious for pneumonia. 2. No renal calculus or hydronephrosis. 3. Diverticulosis without diverticulitis. 4. Aortic atherosclerosis. Electronically Signed   By: Thornell Sartorius M.D.   On: 06/18/2022 22:59   DG Chest 2 View  Result Date: 06/18/2022 CLINICAL DATA:  [name] EXAM: CHEST - 2 VIEW COMPARISON:  Chest x-ray dated May 29, 2022 FINDINGS: The heart size and mediastinal contours are within normal limits. New consolidations of the upper and right lower lung. Lateral view is somewhat limited due to arm position. No evidence of pleural effusion or pneumothorax. Advanced degenerative changes of the right shoulder. IMPRESSION: New consolidations of the upper and right lower lung, concerning for infection. Recommend  follow-up PA and lateral chest radiograph 6-8 weeks to ensure resolution. Electronically Signed   By: Allegra Lai M.D.   On: 06/18/2022 18:23       LOS: 0 days   Orlie Cundari Rito Ehrlich  [facility] Pager on [email or web address]  06/19/2022, 8:35 AM

## 2022-06-19 NOTE — ED Notes (Signed)
Inpatient team at the bedside to assess pt for hospital admission

## 2022-06-19 NOTE — ED Notes (Signed)
Attempted to page Dr. Maisie Fus again regarding pt's critical result of HgB 6.9, Provider has not return page nor has provider responded to secure message, unable to reach out to Dr. Osvaldo Shipper, MD at this time as he has not officially signed on to resume of pt for admission. Will attempt to add Osvaldo Shipper, MD once he has signed on for pt at 07:00 am.

## 2022-06-19 NOTE — ED Notes (Signed)
ED TO INPATIENT HANDOFF REPORT  ED Nurse Name and Phone #: Huntley Dec [phone]  S Name/Age/Gender Molli Barrows 57 y.o. female Room/Bed: WA13/WA13  Code Status   Code Status: Full Code  Home/SNF/Other  Patient oriented to: self, place, time, and situation Is this baseline? Yes   Triage Complete: Triage complete  Chief Complaint HCAP (healthcare-associated pneumonia) [J18.9]  Triage Note BIB facility member from [facility]. Pt reports a weight gain of 15 lbs overnight and SOB that started overnight. Pt is on torsemide and has not missed any doses.  C/o left sided rib pain.    Allergies Allergies  Allergen Reactions   Diazepam [name]   Gabapentin [name] and Swelling    Other reaction(s): Unknown   Iodinated Contrast Media Anaphylaxis    11/29/17 Cardiac arrest 1 min after IV contrast, possible allergy vs vasovagal episode Iopamidol  Anaphylaxis  High 11/28/2017  Patient had seizure like activity and then code post 100 cc of isovue 300     Isovue [Iopamidol] Anaphylaxis    11/28/17 Patient had seizure like activity and then 1 min code after 100 cc of isovue 300. Possible contrast allergy vs vasovagal episode   Lisinopril Anaphylaxis    Tongue and mouth swelling   Metoclopramide Other (See Comments)    Tardive dyskinesia  Also known as Reglan    Nsaids Anaphylaxis and Other (See Comments)    ULCER   Penicillins Palpitations    Has patient had a PCN reaction causing immediate rash, facial/tongue/throat swelling, SOB or lightheadedness with hypotension: Yes, heart races Has patient had a PCN reaction causing severe rash involving mucus membranes or skin necrosis: No Has patient had a PCN reaction that required hospitalization: Yes  Has patient had a PCN reaction occurring within the last 10 years: No    Tolmetin Nausea Only and Other (See Comments)    ULCER   Dicyclomine Other (See Comments)    Chest pain   Acetaminophen Nausea Only  and Other (See Comments)    Irritates stomach ulcer; Abdominal pain   Cyclobenzaprine Palpitations   Oxycodone Palpitations   Rifamycins Palpitations   Tramadol Nausea And Vomiting    Level of Care/Admitting Diagnosis ED Disposition     ED Disposition  Admit   Condition  --   Comment  Hospital Area: [facility] [[id number]]  Level of Care: Progressive [102]  Admit to Progressive based on following criteria: CARDIOVASCULAR & THORACIC of moderate stability with acute coronary syndrome symptoms/low risk myocardial infarction/hypertensive urgency/arrhythmias/heart failure potentially compromising stability and stable post cardiovascular intervention patients.  May admit patient to [facility] or [facility] if equivalent level of care is available:: No  Covid Evaluation: Confirmed COVID Negative  Diagnosis: HCAP (healthcare-associated pneumonia) [454098]  Admitting Physician: Lurline Del [[id number]]  Attending Physician: Lurline Del [[id number]]  Certification:: I certify this patient will need inpatient services for at least 2 midnights  Estimated Length of Stay: 3          B Medical/Surgery History Past Medical History:  Diagnosis Date   Acute back pain with sciatica, left    Acute back pain with sciatica, right    AKI (acute kidney injury)    Anemia, unspecified    Cancer    Carcinoid tumor of duodenum    Chest pain with normal coronary angiography 2019   Chronic kidney disease, stage 3b    Chronic pain    Chronic systolic CHF (congestive heart failure)  Diabetes mellitus    DKA (diabetic ketoacidosis)    Drug-seeking behavior    21 hospitalizations and 14 CT a/p in 2 years for N/V and abdominal pain, demanding only IV dilaudid   Elevated troponin    chronic   Esophageal reflux    Fibromyalgia    Gastric ulcer    Gastroparesis    Gout    Hyperlipidemia    Hypertension    Hypokalemia    Hypomagnesemia    Lumbosacral stenosis     LVH (left ventricular hypertrophy)    Morbid obesity    NICM (nonischemic cardiomyopathy)    PAF (paroxysmal atrial fibrillation)    Stroke 02/2011   Thrombocytosis    Vitamin B12 deficiency anemia    Past Surgical History:  Procedure Laterality Date   BIOPSY  07/27/2019   Procedure: BIOPSY;  Surgeon: Vida Rigger, MD;  Location: [facility];  Service: Endoscopy;;   BIOPSY  07/30/2019   Procedure: BIOPSY;  Surgeon: Kathi Der, MD;  Location: [facility];  Service: Gastroenterology;;   CATARACT EXTRACTION  01/2014   CHOLECYSTECTOMY     COLONOSCOPY WITH PROPOFOL N/A 07/30/2019   Procedure: COLONOSCOPY WITH PROPOFOL;  Surgeon: Kathi Der, MD;  Location: [facility];  Service: Gastroenterology;  Laterality: N/A;   ESOPHAGOGASTRODUODENOSCOPY N/A 07/27/2019   Procedure: ESOPHAGOGASTRODUODENOSCOPY (EGD);  Surgeon: Vida Rigger, MD;  Location: [facility];  Service: Endoscopy;  Laterality: N/A;   ESOPHAGOGASTRODUODENOSCOPY N/A 07/26/2020   Procedure: ESOPHAGOGASTRODUODENOSCOPY (EGD);  Surgeon: Willis Modena, MD;  Location: [facility];  Service: Endoscopy;  Laterality: N/A;   ESOPHAGOGASTRODUODENOSCOPY (EGD) WITH PROPOFOL N/A 08/02/2019   Procedure: ESOPHAGOGASTRODUODENOSCOPY (EGD) WITH PROPOFOL;  Surgeon: Kathi Der, MD;  Location: [facility];  Service: Gastroenterology;  Laterality: N/A;   HEMOSTASIS CLIP PLACEMENT  08/02/2019   Procedure: HEMOSTASIS CLIP PLACEMENT;  Surgeon: Kathi Der, MD;  Location: [facility];  Service: Gastroenterology;;   POLYPECTOMY  07/30/2019   Procedure: POLYPECTOMY;  Surgeon: Kathi Der, MD;  Location: [facility];  Service: Gastroenterology;;   POLYPECTOMY  08/02/2019   Procedure: POLYPECTOMY;  Surgeon: Kathi Der, MD;  Location: [facility];  Service: Gastroenterology;;     A IV Location/Drains/Wounds Patient Lines/Drains/Airways Status     Active Line/Drains/Airways     Name Placement date Placement time Site  Days   Peripheral IV 06/18/22 20 G 1.75" Anterior;Right;Upper Arm 06/18/22  2131  Arm  1            Intake/Output Last 24 hours  Intake/Output Summary (Last 24 hours) at 06/19/2022 1113 Last data filed at 06/19/2022 1478 Gross per 24 hour  Intake 601 ml  Output 320 ml  Net 281 ml    Labs/Imaging Results for orders placed or performed during the hospital encounter of 06/18/22 (from the past 48 hour(s))  Basic metabolic panel     Status: Abnormal   Collection Time: 06/18/22  6:17 PM  Result Value Ref Range   Sodium 132 (L) 135 - 145 mmol/L   Potassium 3.2 (L) 3.5 - 5.1 mmol/L   Chloride 105 98 - 111 mmol/L   CO2 15 (L) 22 - 32 mmol/L   Glucose, Bld 242 (H) 70 - 99 mg/dL    Comment: Glucose reference range applies only to samples taken after fasting for at least 8 hours.   BUN 91 (H) 6 - 20 mg/dL   Creatinine, Ser 2.95 (H) 0.44 - 1.00 mg/dL   Calcium 8.2 (L) 8.9 - 10.3 mg/dL   GFR, Estimated 10 (L) >60 mL/min  Comment: (NOTE) Calculated using the CKD-EPI Creatinine Equation (2021)    Anion gap 12 5 - 15    Comment: Performed at [facility], [address], [place], [place] [address]  CBC     Status: Abnormal   Collection Time: 06/18/22  6:17 PM  Result Value Ref Range   WBC 11.2 (H) 4.0 - 10.5 K/uL   RBC 2.69 (L) 3.87 - 5.11 MIL/uL   Hemoglobin 7.2 (L) 12.0 - 15.0 g/dL   HCT 60.4 (L) 54.0 - 98.1 %   MCV 85.9 80.0 - 100.0 fL   MCH 26.8 26.0 - 34.0 pg   MCHC 31.2 30.0 - 36.0 g/dL   RDW 19.1 (H) 47.8 - 29.5 %   Platelets 245 150 - 400 K/uL   nRBC 0.0 0.0 - 0.2 %    Comment: Performed at [facility], [address], [place], [place] [address]  Brain natriuretic peptide     Status: Abnormal   Collection Time: 06/18/22  6:17 PM  Result Value Ref Range   B Natriuretic Peptide 267.3 (H) 0.0 - 100.0 pg/mL    Comment: Performed at [facility], [address] [place], [place] [address]  Troponin I (High  Sensitivity)     Status: Abnormal   Collection Time: 06/18/22  6:17 PM  Result Value Ref Range   Troponin I (High Sensitivity) 42 (H) <18 ng/L    Comment: (NOTE) Elevated high sensitivity troponin I (hsTnI) values and significant  changes across serial measurements may suggest ACS but many other  chronic and acute conditions are known to elevate hsTnI results.  Refer to the "Links" section for chest pain algorithms and additional  guidance. Performed at [facility], [address] [place], [place] [address]   SARS Coronavirus 2 by RT PCR (hospital order, performed in [facility] hospital lab) *cepheid single result test* Anterior Nasal Swab     Status: None   Collection Time: 06/18/22  9:16 PM   Specimen: Anterior Nasal Swab  Result Value Ref Range   SARS Coronavirus 2 by RT PCR NEGATIVE NEGATIVE    Comment: (NOTE) SARS-CoV-2 target nucleic acids are NOT DETECTED.  The SARS-CoV-2 RNA is generally detectable in upper and lower respiratory specimens during the acute phase of infection. The lowest concentration of SARS-CoV-2 viral copies this assay can detect is 250 copies / mL. A negative result does not preclude SARS-CoV-2 infection and should not be used as the sole basis for treatment or other patient management decisions.  A negative result may occur with improper specimen collection / handling, submission of specimen other than nasopharyngeal swab, presence of viral mutation(s) within the areas targeted by this assay, and inadequate number of viral copies (<250 copies / mL). A negative result must be combined with clinical observations, patient history, and epidemiological information.  Fact Sheet for Patients:   RoadLapTop.co.za  Fact Sheet for Healthcare Providers: http://kim-miller.com/  This test is not yet approved or  cleared by the [place] FDA and has been authorized for detection and/or diagnosis  of SARS-CoV-2 by FDA under an Emergency Use Authorization (EUA).  This EUA will remain in effect (meaning this test can be used) for the duration of the COVID-19 declaration under Section 564(b)(1) of the Act, 21 U.S.C. section 360bbb-3(b)(1), unless the authorization is terminated or revoked sooner.  Performed at [facility], [address], [place], [place] [address]   Troponin I (High Sensitivity)  Status: Abnormal   Collection Time: 06/18/22  9:32 PM  Result Value Ref Range   Troponin I (High Sensitivity) 44 (H) <18 ng/L    Comment: (NOTE) Elevated high sensitivity troponin I (hsTnI) values and significant  changes across serial measurements may suggest ACS but many other  chronic and acute conditions are known to elevate hsTnI results.  Refer to the "Links" section for chest pain algorithms and additional  guidance. Performed at [facility], [address] [place], [place] [address]   Hepatic function panel     Status: Abnormal   Collection Time: 06/18/22  9:32 PM  Result Value Ref Range   Total Protein 7.0 6.5 - 8.1 g/dL   Albumin 2.9 (L) 3.5 - 5.0 g/dL   AST 18 15 - 41 U/L   ALT 31 0 - 44 U/L   Alkaline Phosphatase 92 38 - 126 U/L   Total Bilirubin 0.4 0.3 - 1.2 mg/dL   Bilirubin, Direct <4.7 0.0 - 0.2 mg/dL   Indirect Bilirubin NOT CALCULATED 0.3 - 0.9 mg/dL    Comment: Performed at [facility], [address], [place], [place] [address]  Lipase, blood     Status: Abnormal   Collection Time: 06/18/22  9:32 PM  Result Value Ref Range   Lipase 88 (H) 11 - 51 U/L    Comment: Performed at [facility], [address], [place], [place] [address]  Blood culture (routine x 2)     Status: None (Preliminary result)   Collection Time: 06/18/22 11:29 PM   Specimen: BLOOD  Result Value Ref Range   Specimen Description      BLOOD BLOOD LEFT ARM Performed at [facility], [address], [place], [place] [address]    Special Requests      BOTTLES DRAWN AEROBIC ONLY Blood Culture adequate volume Performed at [facility], [address] [place], [place] [address]    Culture      NO GROWTH < 12 HOURS Performed at [facility], [address], [place], [place] [address]    Report Status PENDING   Lactic acid, plasma     Status: None   Collection Time: 06/18/22 11:34 PM  Result Value Ref Range   Lactic Acid, Venous 1.6 0.5 - 1.9 mmol/L    Comment: Performed at [facility], [address], [place], [place] [address]  Blood culture (routine x 2)     Status: None (Preliminary result)   Collection Time: 06/18/22 11:34 PM   Specimen: BLOOD  Result Value Ref Range   Specimen Description      BLOOD BLOOD LEFT ARM Performed at [facility], [address] [place], [place] [address]    Special Requests      BOTTLES DRAWN AEROBIC AND ANAEROBIC Blood Culture adequate volume Performed at [facility], [address], [place], [place] [address]    Culture      NO GROWTH < 12 HOURS Performed at [facility], [address], [place], [place] [address]    Report Status PENDING   Basic metabolic panel     Status: Abnormal   Collection Time: 06/19/22  2:40 AM  Result Value Ref Range   Sodium 132 (L) 135 - 145 mmol/L   Potassium 4.3 3.5 - 5.1 mmol/L    Comment: HEMOLYSIS AT THIS LEVEL MAY AFFECT RESULT   Chloride 105 98 - 111 mmol/L  CO2 14 (L) 22 - 32 mmol/L   Glucose, Bld 232 (H) 70 - 99 mg/dL    Comment: Glucose reference range applies only to samples taken after fasting for at least 8 hours.   BUN 92 (H) 6 - 20 mg/dL   Creatinine, Ser 1.61 (H) 0.44 - 1.00 mg/dL   Calcium 7.9 (L) 8.9 - 10.3 mg/dL   GFR, Estimated 10 (L) >60 mL/min    Comment: (NOTE) Calculated using the CKD-EPI Creatinine Equation (2021)    Anion gap 13 5 - 15    Comment: Performed at [facility], [address] [place], [place] [address]  TSH     Status: None   Collection Time: 06/19/22  2:40 AM  Result Value Ref Range   TSH 1.659 0.350 - 4.500 uIU/mL    Comment: Performed by a 3rd Generation assay with a functional sensitivity of <=0.01 uIU/mL. Performed at [facility], [address], [place], [place] [address]   Hemoglobin A1c     Status: Abnormal   Collection Time: 06/19/22  2:40 AM  Result Value Ref Range   Hgb A1c MFr Bld 8.1 (H) 4.8 - 5.6 %    Comment: (NOTE) Pre diabetes:          5.7%-6.4%  Diabetes:              >6.4%  Glycemic control for   <7.0% adults with diabetes    Mean Plasma Glucose 185.77 mg/dL    Comment: Performed at [facility], [address], [place], [place] [address]  CBC     Status: Abnormal   Collection Time: 06/19/22  5:17 AM  Result Value Ref Range   WBC 12.6 (H) 4.0 - 10.5 K/uL   RBC 2.54 (L) 3.87 - 5.11 MIL/uL   Hemoglobin 6.9 (LL) 12.0 - 15.0 g/dL    Comment: REPEATED TO VERIFY THIS CRITICAL RESULT HAS VERIFIED AND BEEN CALLED TO MANDY FOX , RN BY MEGAN HAYES ON 04 13 2024 AT 0614, AND HAS BEEN READ BACK. CRITICAL RESULT VERIFIED    HCT 22.5 (L) 36.0 - 46.0 %   MCV 88.6 80.0 - 100.0 fL   MCH 27.2 26.0 - 34.0 pg   MCHC 30.7 30.0 - 36.0 g/dL   RDW 78.2 (H) 95.6 - 21.3 %   Platelets 232 150 - 400 K/uL   nRBC 0.0 0.0 - 0.2 %    Comment: Performed at [facility], [address] [place], [place] [address]  Comprehensive metabolic panel     Status: Abnormal   Collection Time: 06/19/22  5:17 AM  Result Value Ref Range   Sodium 134 (L) 135 - 145 mmol/L   Potassium 3.4 (L) 3.5 - 5.1 mmol/L   Chloride 105 98 - 111 mmol/L   CO2 17 (L) 22 - 32 mmol/L   Glucose, Bld 164 (H) 70 - 99 mg/dL    Comment: Glucose reference range applies only to samples taken after fasting for at least 8 hours.   BUN 92 (H) 6 - 20 mg/dL   Creatinine, Ser 8.46 (H) 0.44 - 1.00 mg/dL   Calcium 8.1  (L) 8.9 - 10.3 mg/dL   Total Protein 6.5 6.5 - 8.1 g/dL   Albumin 2.7 (L) 3.5 - 5.0 g/dL   AST 14 (L) 15 - 41 U/L   ALT 28 0 - 44 U/L   Alkaline Phosphatase 77 38 - 126 U/L   Total Bilirubin 0.8 0.3 -  1.2 mg/dL   GFR, Estimated 10 (L) >60 mL/min    Comment: (NOTE) Calculated using the CKD-EPI Creatinine Equation (2021)    Anion gap 12 5 - 15    Comment: Performed at [facility], [address], [place], [place] [address]  Urinalysis, Complete w Microscopic -Urine, Clean Catch     Status: Abnormal   Collection Time: 06/19/22  6:38 AM  Result Value Ref Range   Color, Urine STRAW (A) YELLOW   APPearance HAZY (A) CLEAR   Specific Gravity, Urine 1.010 1.005 - 1.030   pH 5.0 5.0 - 8.0   Glucose, UA NEGATIVE NEGATIVE mg/dL   Hgb urine dipstick SMALL (A) NEGATIVE   Bilirubin Urine NEGATIVE NEGATIVE   Ketones, ur NEGATIVE NEGATIVE mg/dL   Protein, ur 30 (A) NEGATIVE mg/dL   Nitrite NEGATIVE NEGATIVE   Leukocytes,Ua LARGE (A) NEGATIVE   RBC / HPF 6-10 0 - 5 RBC/hpf   WBC, UA 11-20 0 - 5 WBC/hpf   Bacteria, UA RARE (A) NONE SEEN   Squamous Epithelial / HPF 6-10 0 - 5 /HPF   Mucus PRESENT     Comment: Performed at [facility], [address] [place], [place] [address]  Type and screen [facility]     Status: None (Preliminary result)   Collection Time: 06/19/22  7:30 AM  Result Value Ref Range   ABO/RH(D) O POS    Antibody Screen NEG    Sample Expiration 06/22/2022,2359    Unit Number [id number]    Blood Component Type RED CELLS,LR    Unit division 00    Status of Unit ISSUED    Donor AG Type NEGATIVE FOR E ANTIGEN    Transfusion Status OK TO TRANSFUSE    Crossmatch Result COMPATIBLE    Unit Number [id number]    Blood Component Type RCLI PHER 1    Unit division 00    Status of Unit ALLOCATED    Donor AG Type NEGATIVE FOR E ANTIGEN    Transfusion Status OK TO TRANSFUSE    Crossmatch Result COMPATIBLE   Prepare  RBC (crossmatch)     Status: None   Collection Time: 06/19/22  7:30 AM  Result Value Ref Range   Order Confirmation      ORDER PROCESSED BY BLOOD BANK Performed at [facility], [address], [place], [place] [address]    CT CHEST WO CONTRAST  Result Date: 06/19/2022 CLINICAL DATA:  Abnormal x-ray, lung opacities. [name] and cough. EXAM: CT CHEST WITHOUT CONTRAST TECHNIQUE: Multidetector CT imaging of the chest was performed following the standard protocol without IV contrast. RADIATION DOSE REDUCTION: This exam was performed according to the departmental dose-optimization program which includes automated exposure control, adjustment of the mA and/or kV according to patient size and/or use of iterative reconstruction technique. COMPARISON:  05/10/2022. FINDINGS: Cardiovascular: The heart is normal in size and there is no pericardial effusion. A few scattered coronary artery calcifications are noted. The aorta is normal in caliber. The pulmonary trunk is distended suggesting underlying pulmonary artery hypertension. Mediastinum/Nodes: No mediastinal or axillary lymphadenopathy. Evaluation of the hila is limited due to lack of IV contrast. The thyroid gland, trachea, and esophagus are within normal limits. There is a small hiatal hernia. Lungs/Pleura: Patchy infiltrates are noted in the right lower lobe and there is consolidation in the right upper lobe. A few opacities are noted in the left upper lobe. No effusion or pneumothorax. Upper Abdomen:  The gallbladder is surgically absent. No acute abnormality. Musculoskeletal: Degenerative changes are present in the thoracic spine. No acute osseous abnormality. IMPRESSION: 1. Findings suggestive of multifocal pneumonia, most pronounced in the right lung. 2. Coronary artery calcifications. 3. Aortic atherosclerosis. Electronically Signed   By: Thornell Sartorius M.D.   On: 06/19/2022 04:30   CT Renal Stone Study  Result Date:  06/18/2022 CLINICAL DATA:  Diffuse abdominal pain, difficulty breathing. EXAM: CT ABDOMEN AND PELVIS WITHOUT CONTRAST TECHNIQUE: Multidetector CT imaging of the abdomen and pelvis was performed following the standard protocol without IV contrast. RADIATION DOSE REDUCTION: This exam was performed according to the departmental dose-optimization program which includes automated exposure control, adjustment of the mA and/or kV according to patient size and/or use of iterative reconstruction technique. COMPARISON:  06/02/2022. FINDINGS: Lower chest: A few coronary artery calcifications are noted. Patchy infiltrates are noted in the right lower lobe, concerning for pneumonia. Hepatobiliary: No focal liver abnormality is seen. Status post cholecystectomy. No biliary dilatation. Pancreas: Unremarkable. No pancreatic ductal dilatation or surrounding inflammatory changes. Spleen: Normal in size without focal abnormality. Adrenals/Urinary Tract: The adrenal glands are within normal limits. No renal calculus or hydronephrosis bilaterally. The bladder is unremarkable. Stomach/Bowel: There is a small hiatal hernia. Stomach is within normal limits. Appendix is not definitely seen. No evidence of bowel wall thickening, distention, or inflammatory changes. No free air or pneumatosis. A few scattered diverticula are present along the colon without evidence of diverticulitis. Vascular/Lymphatic: Aortic atherosclerosis. A nonspecific prominent lymph node is present in the retroperitoneum on the left at the level of the left kidney measuring 1 cm. Reproductive: There is a stable pedunculated fibroid extending from the uterus on the left. No adnexal mass. Other: No abdominopelvic ascites. Musculoskeletal: Degenerative changes are present in the thoracolumbar spine. No acute osseous abnormality. IMPRESSION: 1. Patchy infiltrates in the right lower lobe, suspicious for pneumonia. 2. No renal calculus or hydronephrosis. 3. Diverticulosis  without diverticulitis. 4. Aortic atherosclerosis. Electronically Signed   By: Thornell Sartorius M.D.   On: 06/18/2022 22:59   DG Chest 2 View  Result Date: 06/18/2022 CLINICAL DATA:  [name] EXAM: CHEST - 2 VIEW COMPARISON:  Chest x-ray dated May 29, 2022 FINDINGS: The heart size and mediastinal contours are within normal limits. New consolidations of the upper and right lower lung. Lateral view is somewhat limited due to arm position. No evidence of pleural effusion or pneumothorax. Advanced degenerative changes of the right shoulder. IMPRESSION: New consolidations of the upper and right lower lung, concerning for infection. Recommend follow-up PA and lateral chest radiograph 6-8 weeks to ensure resolution. Electronically Signed   By: Allegra Lai M.D.   On: 06/18/2022 18:23    Pending Labs Unresulted Labs (From admission, onward)     Start     Ordered   06/21/22 0500  Vancomycin, random  Once,   R        06/19/22 1040   06/20/22 0500  Uric acid  Tomorrow morning,   R        06/19/22 0854   06/20/22 0500  CBC  Tomorrow morning,   R        06/19/22 0854   06/20/22 0500  Basic metabolic panel  Daily,   R      06/19/22 0854   06/19/22 1035  MRSA Next Gen by PCR, Nasal  (MRSA Screening)  Once,   R        06/19/22 1034   06/19/22 1610  Respiratory (~20 pathogens) panel by PCR  (Respiratory panel by PCR (~20 pathogens, ~24 hr TAT)  w precautions)  Once,   R        06/19/22 0851   06/19/22 0800  Expectorated Sputum Assessment w Gram Stain, Rflx to Resp Cult  Once,   R        06/19/22 0800   06/19/22 0752  Strep pneumoniae urinary antigen  Once,   R        06/19/22 0752   06/19/22 0214  Legionella Pneumophila Serogp 1 Ur Ag  (COPD / Pneumonia / Cellulitis / Lower Extremity Wound)  Once,   R        06/19/22 0219            Vitals/Pain Today's Vitals   06/19/22 0803 06/19/22 0900 06/19/22 0944 06/19/22 1104  BP:  (!) 140/81  (!) 150/85  Pulse:  100  (!) 110  Resp:  16   14  Temp:   98.2 F (36.8 C) 98.1 F (36.7 C)  TempSrc:   Oral Oral  SpO2:  96%  100%  Weight:      Height:      PainSc: 3        Isolation Precautions Droplet precaution  Medications Medications  ceFEPIme (MAXIPIME) 2 g in sodium chloride 0.9 % 100 mL IVPB (has no administration in time range)  vancomycin variable dose per unstable renal function (pharmacist dosing) (has no administration in time range)  ondansetron (ZOFRAN) tablet 4 mg (has no administration in time range)    Or  ondansetron (ZOFRAN) injection 4 mg (has no administration in time range)  HYDROmorphone (DILAUDID) injection 1 mg (1 mg Intravenous Given 06/19/22 1059)  0.9 %  sodium chloride infusion (Manually program via Guardrails IV Fluids) (has no administration in time range)  furosemide (LASIX) injection 80 mg (80 mg Intravenous Given 06/19/22 0725)  albuterol (PROVENTIL) (2.5 MG/3ML) 0.083% nebulizer solution 2.5 mg (has no administration in time range)  allopurinol (ZYLOPRIM) tablet 50 mg (has no administration in time range)  apixaban (ELIQUIS) tablet 5 mg (has no administration in time range)  atorvastatin (LIPITOR) tablet 10 mg (has no administration in time range)  carvedilol (COREG) tablet 12.5 mg (has no administration in time range)  colchicine tablet 0.6 mg (has no administration in time range)  donepezil (ARICEPT) tablet 5 mg (has no administration in time range)  febuxostat (ULORIC) tablet 40 mg (has no administration in time range)  ferrous sulfate tablet 325 mg (has no administration in time range)  folic acid (FOLVITE) tablet 1 mg (has no administration in time range)  mirtazapine (REMERON SOL-TAB) disintegrating tablet 15 mg (has no administration in time range)  pantoprazole (PROTONIX) EC tablet 40 mg (has no administration in time range)  predniSONE (DELTASONE) tablet 20 mg (has no administration in time range)  morphine (PF) 4 MG/ML injection 4 mg (4 mg Intravenous Given 06/18/22 2151)   furosemide (LASIX) injection 40 mg (40 mg Intravenous Given 06/18/22 2149)  HYDROmorphone (DILAUDID) injection 1 mg (1 mg Intravenous Given 06/18/22 2310)  vancomycin (VANCOCIN) IVPB 1000 mg/200 mL premix (0 mg Intravenous Stopped 06/19/22 0203)  ceFEPIme (MAXIPIME) 2 g in sodium chloride 0.9 % 100 mL IVPB (0 g Intravenous Stopped 06/19/22 0007)  vancomycin (VANCOREADY) IVPB 1500 mg/300 mL (0 mg Intravenous Stopped 06/19/22 0622)  furosemide (LASIX) injection 20 mg (20 mg Intravenous Given 06/19/22 1101)    Mobility manual wheelchair     Focused Assessments  R Recommendations: See Admitting Provider Note  Report given to:   Additional Notes:

## 2022-06-19 NOTE — H&P (Signed)
History and Physical    Deborah [name]:[id number] DOB: [birth year] DOA: 06/18/2022  PCP: Elsie Amis, MD  Patient coming from: [facility]   I have personally briefly reviewed patient's old medical records in [facility]  Chief Complaint: sob and weight gain x 1 day , left sided rib pain  HPI: Deborah [name] is a 57 y.o. female with medical history significant of  insulin-dependent diabetes mellitus with hx of HHS hypertension, PAF on Eliquis, CKD stage IV, iron deficiency anemia, chronic pain, duodenal carcinoid tumor resected in 2021. Patient also has interim history of admission  05/28/22 -06/04/22 with diagnosis of acute metabolic encephalopathy due to dehydration with associated AKI on CKDIV, with course complicated by disseminated gout. S/p treatment patient was discharged on colchine, Uloric  and allopurinol as well as steroid taper. Patient s/p  hospitalization was discharge to SNF. Patient now presents s/p d/c from snf with complaint of worsening sob and swelling over the last 24 hours. Patient also notes cough and left lateral side pain. No current fever, n/v/d/ or dysuria  ED Course:  Afeb, 149/78, hr 115, rr 22 sat 100 JYN:WGNFA tachcyardia , LAD  Cxr: New consolidations of the upper and right lower lung, concerning for infection. Recommend follow-up PA and lateral chest radiograph 6-8 weeks to ensure resolution. Na 132, K 3.2, bicarb 15, gly 242 cr 4.84 CE42,44 BNP 267.3 (384.8) Wbc: 11.2,  plt 245 7.2 (9.6) Respiratory panel neg Lipase 88 CT renal IMPRESSION: 1. Patchy infiltrates in the right lower lobe, suspicious for pneumonia. 2. No renal calculus or hydronephrosis. 3. Diverticulosis without diverticulitis. 4. Aortic atherosclerosis.   Tx lasix: ,morphine, dilaudid,cefepime,vanc Lacit 1.6 Review of Systems: As per HPI otherwise 10 point review of systems negative.   Past Medical History:  Diagnosis Date   Acute back pain  with sciatica, left    Acute back pain with sciatica, right    AKI (acute kidney injury)    Anemia, unspecified    Cancer    Carcinoid tumor of duodenum    Chest pain with normal coronary angiography 2019   Chronic kidney disease, stage 3b    Chronic pain    Chronic systolic CHF (congestive heart failure)    Diabetes mellitus    DKA (diabetic ketoacidosis)    Drug-seeking behavior    21 hospitalizations and 14 CT a/p in 2 years for N/V and abdominal pain, demanding only IV dilaudid   Elevated troponin    chronic   Esophageal reflux    Fibromyalgia    Gastric ulcer    Gastroparesis    Gout    Hyperlipidemia    Hypertension    Hypokalemia    Hypomagnesemia    Lumbosacral stenosis    LVH (left ventricular hypertrophy)    Morbid obesity    NICM (nonischemic cardiomyopathy)    PAF (paroxysmal atrial fibrillation)    Stroke 02/2011   Thrombocytosis    Vitamin B12 deficiency anemia     Past Surgical History:  Procedure Laterality Date   BIOPSY  07/27/2019   Procedure: BIOPSY;  Surgeon: Vida Rigger, MD;  Location: [facility];  Service: Endoscopy;;   BIOPSY  07/30/2019   Procedure: BIOPSY;  Surgeon: Kathi Der, MD;  Location: [facility];  Service: Gastroenterology;;   CATARACT EXTRACTION  01/2014   CHOLECYSTECTOMY     COLONOSCOPY WITH PROPOFOL N/A 07/30/2019   Procedure: COLONOSCOPY WITH PROPOFOL;  Surgeon: Kathi Der, MD;  Location: [facility];  Service: Gastroenterology;  Laterality: N/A;  ESOPHAGOGASTRODUODENOSCOPY N/A 07/27/2019   Procedure: ESOPHAGOGASTRODUODENOSCOPY (EGD);  Surgeon: Vida Rigger, MD;  Location: [facility];  Service: Endoscopy;  Laterality: N/A;   ESOPHAGOGASTRODUODENOSCOPY N/A 07/26/2020   Procedure: ESOPHAGOGASTRODUODENOSCOPY (EGD);  Surgeon: Willis Modena, MD;  Location: [facility];  Service: Endoscopy;  Laterality: N/A;   ESOPHAGOGASTRODUODENOSCOPY (EGD) WITH PROPOFOL N/A 08/02/2019   Procedure: ESOPHAGOGASTRODUODENOSCOPY (EGD)  WITH PROPOFOL;  Surgeon: Kathi Der, MD;  Location: [facility];  Service: Gastroenterology;  Laterality: N/A;   HEMOSTASIS CLIP PLACEMENT  08/02/2019   Procedure: HEMOSTASIS CLIP PLACEMENT;  Surgeon: Kathi Der, MD;  Location: [facility];  Service: Gastroenterology;;   POLYPECTOMY  07/30/2019   Procedure: POLYPECTOMY;  Surgeon: Kathi Der, MD;  Location: [facility];  Service: Gastroenterology;;   POLYPECTOMY  08/02/2019   Procedure: POLYPECTOMY;  Surgeon: Kathi Der, MD;  Location: [facility];  Service: Gastroenterology;;     reports that she has never smoked. She has never used smokeless tobacco. She reports that she does not drink alcohol and does not use drugs.  Allergies  Allergen Reactions   Diazepam [name]   Gabapentin [name] and Swelling    Other reaction(s): Unknown   Iodinated Contrast Media Anaphylaxis    11/29/17 Cardiac arrest 1 min after IV contrast, possible allergy vs vasovagal episode Iopamidol  Anaphylaxis  High 11/28/2017  Patient had seizure like activity and then code post 100 cc of isovue 300     Isovue [Iopamidol] Anaphylaxis    11/28/17 Patient had seizure like activity and then 1 min code after 100 cc of isovue 300. Possible contrast allergy vs vasovagal episode   Lisinopril Anaphylaxis    Tongue and mouth swelling   Metoclopramide Other (See Comments)    Tardive dyskinesia  Also known as Reglan    Nsaids Anaphylaxis and Other (See Comments)    ULCER   Penicillins Palpitations    Has patient had a PCN reaction causing immediate rash, facial/tongue/throat swelling, SOB or lightheadedness with hypotension: Yes, heart races Has patient had a PCN reaction causing severe rash involving mucus membranes or skin necrosis: No Has patient had a PCN reaction that required hospitalization: Yes  Has patient had a PCN reaction occurring within the last 10 years: No    Tolmetin Nausea Only and Other (See Comments)     ULCER   Dicyclomine Other (See Comments)    Chest pain   Acetaminophen Nausea Only and Other (See Comments)    Irritates stomach ulcer; Abdominal pain   Cyclobenzaprine Palpitations   Oxycodone Palpitations   Rifamycins Palpitations   Tramadol Nausea And Vomiting    Family History  Problem Relation Age of Onset   Diabetes Mother    Diabetes Father    Heart disease Father    Diabetes Sister    Congestive Heart Failure Sister 35   Diabetes Brother     Prior to Admission medications   Medication Sig Start Date End Date Taking? Authorizing Provider  albuterol (PROVENTIL) (2.5 MG/3ML) 0.083% nebulizer solution Take 3 mLs (2.5 mg total) by nebulization every 6 (six) hours as needed for wheezing or shortness of breath. 04/06/19   Bing Neighbors, NP  allopurinol (ZYLOPRIM) 100 MG tablet Take 0.5 tablets (50 mg total) by mouth daily. 06/04/22   Leroy Sea, MD  amLODipine (NORVASC) 10 MG tablet Take 1 tablet (10 mg total) by mouth daily. 06/04/22   Leroy Sea, MD  apixaban (ELIQUIS) 5 MG TABS tablet Take 5 mg by mouth 2 (two) times daily.  12/03/21   [provider]  atorvastatin (LIPITOR) 10 MG tablet Take 10 mg by mouth daily. 12/03/21   [provider]  carvedilol (COREG) 12.5 MG tablet Take 1 tablet (12.5 mg total) by mouth 2 (two) times daily with a meal. 06/04/22   Leroy Sea, MD  cetirizine (ZYRTEC) 10 MG tablet TAKE 1 TABLET (10 MG TOTAL) BY MOUTH DAILY (AM) Patient taking differently: Take 10 mg by mouth daily. 06/30/21   Raulkar, Drema Pry, MD  colchicine 0.6 MG tablet Take 0.5 tablets (0.3 mg total) by mouth daily. 06/04/22   Leroy Sea, MD  diclofenac Sodium (VOLTAREN) 1 % GEL Apply 4 g topically 4 (four) times daily. 05/27/22   Kathlen Mody, MD  donepezil (ARICEPT) 5 MG tablet Take 1 tablet (5 mg total) by mouth at bedtime. 06/04/22   Leroy Sea, MD  EASY COMFORT PEN NEEDLES 31G X 5 MM MISC USE 3 TIMES A DAY FOR INSULIN  ADMINISTRATION 11/14/19   Meccariello, Solmon Ice, MD  famotidine (PEPCID) 20 MG tablet Take 1 tablet (20 mg total) by mouth daily. 03/22/22 07/20/22  Cora Collum, DO  febuxostat (ULORIC) 40 MG tablet Take 1 tablet (40 mg total) by mouth daily. 06/04/22   Leroy Sea, MD  ferrous sulfate 325 (65 FE) MG tablet Take 1 tablet (325 mg total) by mouth 2 (two) times daily with a meal. 05/27/22   Kathlen Mody, MD  fluticasone (FLONASE) 50 MCG/ACT nasal spray Place 2 sprays into both nostrils daily as needed for allergies or rhinitis. Patient taking differently: Place 1 spray into both nostrils daily as needed for allergies. 12/19/18   Rai, Delene Ruffini, MD  folic acid (FOLVITE) 1 MG tablet Take 1 mg by mouth daily. 04/07/21   [provider]  hydrALAZINE (APRESOLINE) 50 MG tablet Take 1 tablet (50 mg total) by mouth every 8 (eight) hours. 06/04/22   Leroy Sea, MD  insulin aspart (NOVOLOG) 100 UNIT/ML injection Inject 5 Units into the skin 3 (three) times daily with meals. 06/04/22   Leroy Sea, MD  insulin glargine-yfgn (SEMGLEE) 100 UNIT/ML injection Inject 0.2 mLs (20 Units total) into the skin 2 (two) times daily. 06/04/22   Leroy Sea, MD  insulin lispro (HUMALOG) 100 UNIT/ML KwikPen Before each meal 3 times a day, 140-199 - 2 units, 200-250 - 6 units, 251-299 - 8 units,  300-349 - 12 units,  350 or above 14 units. 06/04/22   Leroy Sea, MD  mirtazapine (REMERON SOL-TAB) 15 MG disintegrating tablet Take 1 tablet (15 mg total) by mouth at bedtime. 06/04/22   Leroy Sea, MD  Multiple Vitamin (MULTIVITAMIN WITH MINERALS) TABS tablet Take 1 tablet by mouth daily. 05/28/22   Kathlen Mody, MD  nystatin powder Apply 1 Application topically daily. Apply to under both breast topically.    [provider]  ondansetron (ZOFRAN ODT) 4 MG disintegrating tablet Take 1 tablet (4 mg total) by mouth every 8 (eight) hours as needed for nausea or vomiting. 05/30/21    [name], MD  pantoprazole (PROTONIX) 40 MG tablet TAKE 1 TABLET BY MOUTH 2 (TWO) TIMES DAILY. (AM+BEDTIME) Patient taking differently: Take 40 mg by mouth 2 (two) times daily. 03/11/22   Raulkar, Drema Pry, MD  predniSONE (DELTASONE) 5 MG tablet take 8 Pills PO for 4 days, 6 Pills PO for 4 days, 4 Pills PO for 4 days, 2 Pills PO for 3 days, 1 Pills PO for 3 days,  1/2 Pill  PO for 3 days then STOP. 06/04/22   Leroy Sea, MD  SENNA PLUS 8.6-50 MG tablet Take 1 tablet by mouth daily. 05/19/21   [provider]  sodium bicarbonate 650 MG tablet Take 1 tablet (650 mg total) by mouth 2 (two) times daily. 04/20/21   Lurene Shadow, MD  torsemide (DEMADEX) 20 MG tablet Take 1 tablet (20 mg total) by mouth daily. 06/04/22   Leroy Sea, MD  Vitamin D, Ergocalciferol, (DRISDOL) 1.25 MG (50000 UNIT) CAPS capsule Take 50,000 Units by mouth every Monday. 04/07/21   [provider]    Physical Exam: Vitals:   06/18/22 2345 06/19/22 0000 06/19/22 0015 06/19/22 0100  BP: (!) 147/85 (!) 140/77 134/72 130/83  Pulse: (!) 106 (!) 110 (!) 111 (!) 109  Resp: (!) 21 19 (!) 21 20  Temp:      TempSrc:      SpO2: 99% 98% 100% 100%  Weight:      Height:        Constitutional: NAD, calm, comfortable Vitals:   06/18/22 2345 06/19/22 0000 06/19/22 0015 06/19/22 0100  BP: (!) 147/85 (!) 140/77 134/72 130/83  Pulse: (!) 106 (!) 110 (!) 111 (!) 109  Resp: (!) 21 19 (!) 21 20  Temp:      TempSrc:      SpO2: 99% 98% 100% 100%  Weight:      Height:       Eyes: PERRL, lids and conjunctivae normal ENMT: Mucous membranes are moist. Posterior pharynx clear of any exudate or lesions.Normal dentition.  Neck: normal, supple, no masses, no thyromegaly Respiratory: clear to auscultation bilaterally, no wheezing, no crackles. Normal respiratory effort. No accessory muscle use.  Cardiovascular: Regular rate and rhythm, no murmurs / rubs / gallops. trace extremity edema. 2+ pedal pulses.   Abdomen: no tenderness, no masses palpated. No hepatosplenomegaly. Bowel sounds positive.  Musculoskeletal: no clubbing / cyanosis. No joint deformity upper and lower extremities. Good ROM, no contractures. Normal muscle tone.  Skin: no rashes, lesions, ulcers. No induration Neurologic: CN 2-12 grossly intact. Sensation intactl. Strength 5/5 in all 4.  Psychiatric: Normal judgment and insight. Alert and oriented x 3. Normal mood.    Labs on Admission: I have personally reviewed following labs and imaging studies  CBC: Recent Labs  Lab 06/18/22 1817  WBC 11.2*  HGB 7.2*  HCT 23.1*  MCV 85.9  PLT 245   Basic Metabolic Panel: Recent Labs  Lab 06/18/22 1817  NA 132*  K 3.2*  CL 105  CO2 15*  GLUCOSE 242*  BUN 91*  CREATININE 4.84*  CALCIUM 8.2*   GFR: Estimated Creatinine Clearance: 18.5 mL/min (A) (by C-G formula based on SCr of 4.84 mg/dL (H)). Liver Function Tests: Recent Labs  Lab 06/18/22 2132  AST 18  ALT 31  ALKPHOS 92  BILITOT 0.4  PROT 7.0  ALBUMIN 2.9*   Recent Labs  Lab 06/18/22 2132  LIPASE 88*   No results for input(s): "AMMONIA" in the last 168 hours. Coagulation Profile: No results for input(s): "INR", "PROTIME" in the last 168 hours. Cardiac Enzymes: No results for input(s): "CKTOTAL", "CKMB", "CKMBINDEX", "TROPONINI" in the last 168 hours. BNP (last 3 results) No results for input(s): "PROBNP" in the last 8760 hours. HbA1C: No results for input(s): "HGBA1C" in the last 72 hours. CBG: No results for input(s): "GLUCAP" in the last 168 hours. Lipid Profile: No results for input(s): "CHOL", "HDL", "LDLCALC", "TRIG", "CHOLHDL", "LDLDIRECT" in the  last 72 hours. Thyroid Function Tests: No results for input(s): "TSH", "T4TOTAL", "FREET4", "T3FREE", "THYROIDAB" in the last 72 hours. Anemia Panel: No results for input(s): "VITAMINB12", "FOLATE", "FERRITIN", "TIBC", "IRON", "RETICCTPCT" in the last 72 hours. Urine analysis:    Component Value  Date/Time   COLORURINE YELLOW 05/29/2022 0820   APPEARANCEUR CLOUDY (A) 05/29/2022 0820   LABSPEC 1.012 05/29/2022 0820   PHURINE 5.0 05/29/2022 0820   GLUCOSEU NEGATIVE 05/29/2022 0820   HGBUR NEGATIVE 05/29/2022 0820   BILIRUBINUR NEGATIVE 05/29/2022 0820   KETONESUR 5 (A) 05/29/2022 0820   PROTEINUR 30 (A) 05/29/2022 0820   UROBILINOGEN 0.2 10/02/2013 2108   NITRITE NEGATIVE 05/29/2022 0820   LEUKOCYTESUR LARGE (A) 05/29/2022 0820    Radiological Exams on Admission: CT Renal Stone Study  Result Date: 06/18/2022 CLINICAL DATA:  Diffuse abdominal pain, difficulty breathing. EXAM: CT ABDOMEN AND PELVIS WITHOUT CONTRAST TECHNIQUE: Multidetector CT imaging of the abdomen and pelvis was performed following the standard protocol without IV contrast. RADIATION DOSE REDUCTION: This exam was performed according to the departmental dose-optimization program which includes automated exposure control, adjustment of the mA and/or kV according to patient size and/or use of iterative reconstruction technique. COMPARISON:  06/02/2022. FINDINGS: Lower chest: A few coronary artery calcifications are noted. Patchy infiltrates are noted in the right lower lobe, concerning for pneumonia. Hepatobiliary: No focal liver abnormality is seen. Status post cholecystectomy. No biliary dilatation. Pancreas: Unremarkable. No pancreatic ductal dilatation or surrounding inflammatory changes. Spleen: Normal in size without focal abnormality. Adrenals/Urinary Tract: The adrenal glands are within normal limits. No renal calculus or hydronephrosis bilaterally. The bladder is unremarkable. Stomach/Bowel: There is a small hiatal hernia. Stomach is within normal limits. Appendix is not definitely seen. No evidence of bowel wall thickening, distention, or inflammatory changes. No free air or pneumatosis. A few scattered diverticula are present along the colon without evidence of diverticulitis. Vascular/Lymphatic: Aortic  atherosclerosis. A nonspecific prominent lymph node is present in the retroperitoneum on the left at the level of the left kidney measuring 1 cm. Reproductive: There is a stable pedunculated fibroid extending from the uterus on the left. No adnexal mass. Other: No abdominopelvic ascites. Musculoskeletal: Degenerative changes are present in the thoracolumbar spine. No acute osseous abnormality. IMPRESSION: 1. Patchy infiltrates in the right lower lobe, suspicious for pneumonia. 2. No renal calculus or hydronephrosis. 3. Diverticulosis without diverticulitis. 4. Aortic atherosclerosis. Electronically Signed   By: Thornell Sartorius M.D.   On: 06/18/2022 22:59   DG Chest 2 View  Result Date: 06/18/2022 CLINICAL DATA:  [name] EXAM: CHEST - 2 VIEW COMPARISON:  Chest x-ray dated May 29, 2022 FINDINGS: The heart size and mediastinal contours are within normal limits. New consolidations of the upper and right lower lung. Lateral view is somewhat limited due to arm position. No evidence of pleural effusion or pneumothorax. Advanced degenerative changes of the right shoulder. IMPRESSION: New consolidations of the upper and right lower lung, concerning for infection. Recommend follow-up PA and lateral chest radiograph 6-8 weeks to ensure resolution. Electronically Signed   By: Allegra Lai M.D.   On: 06/18/2022 18:23    EKG: Independently reviewed. See above Assessment/Plan  HCAP -patient with Opacities on chest imaging and sob  -cefepime/vanc to cover for HCAP  -de-escalate as able  -pulmonary toilet  -urine ag, sputum,  blood culture f/u on culture data  -not hypoxic currently   Concern for Fluid overload  -last echo noted normal ef -bnp lower than prior  -CT noted  no pulmonary edema -lower extremity edema presumed venostasis in setting of obesity   -s/p lasix in ED will hold and diuresis at this time  AKI on CKDIV  -hold nephrotoxic medication -repeat labs after initial trial of lasix   -associated metabolic acidosis -f/u renal recs    Acute on Chronic Anemia transfusion dependent  -drop in hgb noted  -check fob  -anemia labs  -monitor labs  -has referral to hematology as out patient   Hypokalemia - replete prn   Insulin-dependent diabetes mellitus  -with hx of HHS  -place on iss  -resume home regimen once med rec completed    PAF  -continue on Eliquis and metoprolol  HTN  -stable ,resume Norvasc, Coreg, hydralazine   Gout -continue colchine , ulroic , allopurinol    Chronic pain  -resume chronic home regimen    Doudenal Carcinoid Tumor  DVT prophylaxis: Eliquis Code Status: full code  Family Communication: none at bedside Disposition Plan: patient  expected to be admitted greater than 2 midnights  Consults called: Renal consult in the am  dr Marisue Humble  Admission status: med tele   Lurline Del MD [facility]   If 7PM-7AM, please contact night-coverage [email or web address] Password [email or web address]  06/19/2022, 1:40 AM

## 2022-06-19 NOTE — ED Notes (Signed)
Dr. Maisie Fus paged regarding pain medication, awaiting call back

## 2022-06-19 NOTE — ED Notes (Signed)
Pt reports is unable to urinate, advised will need to bladder scan then may need to cath for urine. Pt verbalized understanding.  Pt also reports that "fentanyl will not work, I haven't had it, usually they always give me dilaudid which works great for me". Educated pt on fentanyl and providers orders, pt verbalized understanding and voiced she will speak with Dr. Maisie Fus when she makes her rounds as attending has not been in to see pt for admission.  Radiology tech called and notified that pt been given pain medication if they could like to try to take her to CT for chest scan at this time.

## 2022-06-19 NOTE — Progress Notes (Signed)
Pharmacy Antibiotic Note  Deborah [name] is a 57 y.o. female admitted on 06/18/2022 with pneumonia.  In the ED patient received Vancomycin 1gm IV and Cefepime 2gm IV x 1 dose each.  Pharmacy has been consulted for Vancomycin and Cefepime dosing.  Plan: Cefepime 2gm IV q24h Vancomycin 1500mg  IV x 1 dose now (for total loading dose of 2500mg ) F/u culture results and sensitivities Will follow renal function and monitor vancomycin with random levels and dose accordingly until renal function stabilizes  Height: 5\' 6"  (167.6 cm) Weight: (!) 142.9 kg (315 lb) IBW/kg (Calculated) : 59.3  Temp (24hrs), Avg:98.3 F (36.8 C), Min:98.1 F (36.7 C), Max:98.4 F (36.9 C)  Recent Labs  Lab 06/18/22 1817 06/18/22 2334  WBC 11.2*  --   CREATININE 4.84*  --   LATICACIDVEN  --  1.6    Estimated Creatinine Clearance: 18.5 mL/min (A) (by C-G formula based on SCr of 4.84 mg/dL (H)).    Allergies  Allergen Reactions   Diazepam [name]   Gabapentin [name] and Swelling    Other reaction(s): Unknown   Iodinated Contrast Media Anaphylaxis    11/29/17 Cardiac arrest 1 min after IV contrast, possible allergy vs vasovagal episode Iopamidol  Anaphylaxis  High 11/28/2017  Patient had seizure like activity and then code post 100 cc of isovue 300     Isovue [Iopamidol] Anaphylaxis    11/28/17 Patient had seizure like activity and then 1 min code after 100 cc of isovue 300. Possible contrast allergy vs vasovagal episode   Lisinopril Anaphylaxis    Tongue and mouth swelling   Metoclopramide Other (See Comments)    Tardive dyskinesia  Also known as Reglan    Nsaids Anaphylaxis and Other (See Comments)    ULCER   Penicillins Palpitations    Has patient had a PCN reaction causing immediate rash, facial/tongue/throat swelling, SOB or lightheadedness with hypotension: Yes, heart races Has patient had a PCN reaction causing severe rash involving mucus membranes or skin necrosis:  No Has patient had a PCN reaction that required hospitalization: Yes  Has patient had a PCN reaction occurring within the last 10 years: No    Tolmetin Nausea Only and Other (See Comments)    ULCER   Dicyclomine Other (See Comments)    Chest pain   Acetaminophen Nausea Only and Other (See Comments)    Irritates stomach ulcer; Abdominal pain   Cyclobenzaprine Palpitations   Oxycodone Palpitations   Rifamycins Palpitations   Tramadol Nausea And Vomiting    Antimicrobials this admission: 4/12 Vancomycin >>   4/12 Cefepime >>    Dose adjustments this admission:    Microbiology results: 4/12 BCx:      Thank you for allowing pharmacy to be a part of this patient's care.  Maryellen Pile, PharmD 06/19/2022 1:58 AM

## 2022-06-19 NOTE — ED Notes (Signed)
Pt to CT scanner at this time 

## 2022-06-19 NOTE — Progress Notes (Addendum)
Patient refuses to have bed exit on. States "No you gotta leave that off. When I need to use the bathroom, I need to get up and get to the commode." Patient educated on falls risk status and asked to call before getting out of bed. Bedside commode placed close to bedside at patient's request. Patient also declines nonskid socks, states "I just want my bedroom slippers."

## 2022-06-19 NOTE — Progress Notes (Signed)
Consulted IV team to come place iv because pt keeps bending arm and causing pump to beep. When they came to speak with her patient is refusing to lay down in the bed for a new iv access to be done only if they can do it with her sitting up. IV was flushed with good blood return. Went to get blood to start infusion and pt is not keeping arm straight, IV pump has been beeping 45 mins at this time. Pt needs ultrasound to start IV so this writer is unable to start one d/t difficult access. Explained to patient the importance of the blood transfusion and she would need to lay down when they come to start a new iv access. New consult was placed again for IV team.

## 2022-06-19 NOTE — Progress Notes (Signed)
Pt is insisiting that her diet be changed to a regular diet, has called service response several times wanting diet order changed. She stated if it is not changed she will door dash her own food here.

## 2022-06-20 DIAGNOSIS — D638 Anemia in other chronic diseases classified elsewhere: Secondary | ICD-10-CM

## 2022-06-20 DIAGNOSIS — N179 Acute kidney failure, unspecified: Secondary | ICD-10-CM

## 2022-06-20 DIAGNOSIS — J123 Human metapneumovirus pneumonia: Secondary | ICD-10-CM | POA: Diagnosis not present

## 2022-06-20 DIAGNOSIS — J189 Pneumonia, unspecified organism: Secondary | ICD-10-CM | POA: Diagnosis not present

## 2022-06-20 LAB — TYPE AND SCREEN
ABO/RH(D): O POS
Donor AG Type: NEGATIVE
Donor AG Type: NEGATIVE
Unit division: 0
Unit division: 0

## 2022-06-20 LAB — BASIC METABOLIC PANEL
Anion gap: 13 (ref 5–15)
BUN: 89 mg/dL — ABNORMAL HIGH (ref 6–20)
CO2: 14 mmol/L — ABNORMAL LOW (ref 22–32)
Calcium: 8.1 mg/dL — ABNORMAL LOW (ref 8.9–10.3)
Chloride: 104 mmol/L (ref 98–111)
Creatinine, Ser: 4.83 mg/dL — ABNORMAL HIGH (ref 0.44–1.00)
GFR, Estimated: 10 mL/min — ABNORMAL LOW (ref >60)
Glucose, Bld: 148 mg/dL — ABNORMAL HIGH (ref 70–99)
Potassium: 3.3 mmol/L — ABNORMAL LOW (ref 3.5–5.1)
Sodium: 131 mmol/L — ABNORMAL LOW (ref 135–145)

## 2022-06-20 LAB — CBC
HCT: 24.1 % — ABNORMAL LOW (ref 36.0–46.0)
Hemoglobin: 7.6 g/dL — ABNORMAL LOW (ref 12.0–15.0)
MCH: 27 pg (ref 26.0–34.0)
MCHC: 31.5 g/dL (ref 30.0–36.0)
MCV: 85.5 fL (ref 80.0–100.0)
Platelets: 193 10*3/uL (ref 150–400)
RBC: 2.82 MIL/uL — ABNORMAL LOW (ref 3.87–5.11)
RDW: 20.6 % — ABNORMAL HIGH (ref 11.5–15.5)
WBC: 12.1 10*3/uL — ABNORMAL HIGH (ref 4.0–10.5)
nRBC: 0 % (ref 0.0–0.2)

## 2022-06-20 LAB — BPAM RBC
Blood Product Expiration Date: 202405162359
ISSUE DATE / TIME: 202404131713
Unit Type and Rh: 5100
Unit Type and Rh: 5100

## 2022-06-20 LAB — URIC ACID: Uric Acid, Serum: 8 mg/dL — ABNORMAL HIGH (ref 2.5–7.1)

## 2022-06-20 MED ORDER — FUROSEMIDE 10 MG/ML IJ SOLN
80.0000 mg | Freq: Two times a day (BID) | INTRAMUSCULAR | Status: DC
  Administered 2022-06-20 – 2022-06-21 (×3): 80 mg via INTRAVENOUS
  Filled 2022-06-20 (×3): qty 8

## 2022-06-20 MED ORDER — POTASSIUM CHLORIDE CRYS ER 20 MEQ PO TBCR
40.0000 meq | EXTENDED_RELEASE_TABLET | Freq: Once | ORAL | Status: AC
  Administered 2022-06-20: 40 meq via ORAL
  Filled 2022-06-20: qty 2

## 2022-06-20 MED ORDER — ALLOPURINOL 100 MG PO TABS
50.0000 mg | ORAL_TABLET | ORAL | Status: DC
  Administered 2022-06-23 – 2022-06-30 (×3): 50 mg via ORAL
  Filled 2022-06-20 (×3): qty 1

## 2022-06-20 MED ORDER — LOPERAMIDE HCL 2 MG PO CAPS
2.0000 mg | ORAL_CAPSULE | Freq: Four times a day (QID) | ORAL | Status: DC | PRN
  Administered 2022-06-24 – 2022-07-02 (×5): 2 mg via ORAL
  Filled 2022-06-20 (×5): qty 1

## 2022-06-20 MED ORDER — MIRTAZAPINE 15 MG PO TABS
30.0000 mg | ORAL_TABLET | Freq: Every day | ORAL | Status: DC
  Administered 2022-06-20 – 2022-07-02 (×13): 30 mg via ORAL
  Filled 2022-06-20 (×13): qty 2

## 2022-06-20 MED ORDER — POTASSIUM CHLORIDE CRYS ER 20 MEQ PO TBCR: 20.0000 meq | EXTENDED_RELEASE_TABLET | Freq: Once | ORAL | Status: DC

## 2022-06-20 NOTE — Progress Notes (Signed)
[facility] PROGRESS NOTE   DEVLYN PARISH RUE:[id number] DOB: [birth year] DOA: 06/18/2022  PCP: Elsie Amis, MD  Brief History/Interval Summary: 57 y.o. female with medical history significant for  insulin-dependent diabetes mellitus with hx of hypertension, PAF on Eliquis, CKD stage IV, iron deficiency anemia, chronic pain, duodenal carcinoid tumor resected in 2021. Patient also has interim history of admission 05/28/22 -06/04/22 with diagnosis of acute metabolic encephalopathy due to dehydration with associated AKI on CKDIV, with course complicated by disseminated gout. S/p treatment patient was discharged on colchine, Uloric  and allopurinol as well as steroid taper. Patient s/p  hospitalization was discharge to SNF. Patient now presents s/p d/c from snf with complaint of worsening sob and swelling over the last 24 hours. Patient also notes cough and left lateral side pain.   Consultants: Nephrology  Procedures: None yet    Subjective/Interval History: Patient complains of pain all over.  Swelling still persist.  Denies any nausea vomiting.  [name] is about the same with no significant improvement.      Assessment/Plan:  Pneumonia in the setting of recent hospital admission/metapneumovirus infection Imaging studies showed multifocal opacities.  COVID-19 PCR was negative.  She is afebrile.  WBC was noted to be elevated.   Patient was started on broad-spectrum antibiotics and remains on vancomycin and cefepime.  Respiratory viral panel positive for metapneumovirus which could be the reason for her pneumonia.  Lactic acid level was normal.  Will check a procalcitonin level tomorrow morning and consider de-escalation of her antibacterials tomorrow.  Follow-up on blood cultures as well.  Continues to saturate normal on room air.  Acute kidney injury on chronic kidney disease stage IV/concern for fluid overload/hypokalemia/hyponatremia When she was  last hospitalized she was discharged with a creatinine of 2.2.  Came in with a creatinine of 4.8.  Has gained a lot of weight according to the patient, about 15 pounds.  Also has worsening lower extremity edema. Nephrology consulted.  Furosemide dose changed to 80 mg twice a day.  Monitor renal function closely.  Ins and outs Daily weights. Echocardiogram from July 2023 showed normal left ventricular systolic function. Fluid overload is likely due to worsening renal function. CT renal study did not show any hydronephrosis. Torsemide on hold.  Sodium bicarbonate on hold per nephrology. Potassium to be repleted Sodium level noted to be lower today.  Continue to monitor.  Anemia of chronic kidney disease/acute on chronic anemia Drop in hemoglobin noted.  When she was discharged last month her hemoglobin was 9.6.  Came in with a hemoglobin of 7.2.  Hemoglobin noted to be 6.9 yesterday.  Transfused 2 units of PRBC.  Hemoglobin 7.6 today.  No evidence of overt bleeding.  Recheck labs tomorrow.   Apparently supposed to follow-up with hematology for her longstanding anemia.  Recently check anemia panel in March reviewed.  Left-sided abdominal pain CT renal study did not show any obvious abnormalities.  Diverticulosis was noted but no diverticulitis.  She does not have any significant stool burden.  Will continue to monitor for now.  LFTs were noted to be unremarkable.  Lipase level was noted to be mildly elevated but clinical significance is unclear.  Could have been elevated due to her renal dysfunction.  She is not experiencing pain in the epigastric area. Abdomen remains benign on examination.  Continue to monitor.  Insulin-dependent diabetes mellitus Currently on SSI.  Monitor CBGs.  HbA1c 8.1.  Paroxysmal atrial fibrillation Continue Eliquis and metoprolol.  No  evidence of overt bleeding.  Essential hypertension Blood pressure is reasonably well-controlled.  It appears that she is on amlodipine  carvedilol hydralazine prior to admission.  These are currently on hold.  History of gout with recent disseminated gout Continue allopurinol colchicine and also on Uloric. Uric acid level still elevated at 8.0 but better than before.  Level was 16.5 on March 23.  Had improved to 10.4 on March 28.   Continue current management for now.   Chronic pain syndrome On oral hydromorphone chronically.  Currently getting intravenously.  History of duodenal carcinoid tumor Status post resection previously.  History of lumbar spinal stenosis Has chronic bilateral lower extremity weakness.  Is wheelchair-bound.  Has been imaged previously and MRI has been discussed with neurosurgery.  Outpatient follow-up was recommended.  Obesity Estimated body mass index is 53.8 kg/m as calculated from the following:   Height as of this encounter: 5\' 6"  (1.676 m).   Weight as of this encounter: 151.2 kg.   DVT Prophylaxis: Eliquis  Code Status: Full code Family Communication: Discussed with the patient Disposition Plan: To be determined.  She is here from a skilled nursing facility but tells me that she was supposed to be released.  Will involve PT and OT.  Involve TOC.  Status is: Inpatient Remains inpatient appropriate because: Acute kidney injury      Medications: Scheduled:  allopurinol  50 mg Oral Daily   apixaban  5 mg Oral BID   atorvastatin  10 mg Oral Daily   carvedilol  12.5 mg Oral BID WC   colchicine  0.3 mg Oral Daily   donepezil  5 mg Oral QHS   febuxostat  40 mg Oral Daily   ferrous sulfate  325 mg Oral BID WC   folic acid  1 mg Oral Daily   furosemide  80 mg Intravenous Q12H   mirtazapine  15 mg Oral QHS   pantoprazole  40 mg Oral BID   predniSONE  20 mg Oral Q breakfast   vancomycin variable dose per unstable renal function (pharmacist dosing)   Does not apply See admin instructions   Continuous:  ceFEPime (MAXIPIME) IV 2 g (06/19/22 2316)   JOI:TGPQDIYME,  guaiFENesin-dextromethorphan, HYDROmorphone (DILAUDID) injection, ondansetron **OR** ondansetron (ZOFRAN) IV  Antibiotics: Anti-infectives (From admission, onward)    Start     Dose/Rate Route Frequency Ordered Stop   06/19/22 2300  ceFEPIme (MAXIPIME) 2 g in sodium chloride 0.9 % 100 mL IVPB        2 g 200 mL/hr over 30 Minutes Intravenous Every 24 hours 06/19/22 0156     06/19/22 0200  vancomycin (VANCOREADY) IVPB 1500 mg/300 mL        1,500 mg 150 mL/hr over 120 Minutes Intravenous  Once 06/19/22 0156 06/19/22 0622   06/19/22 0157  vancomycin variable dose per unstable renal function (pharmacist dosing)         Does not apply See admin instructions 06/19/22 0157     06/18/22 2330  ceFEPIme (MAXIPIME) 2 g in sodium chloride 0.9 % 100 mL IVPB        2 g 200 mL/hr over 30 Minutes Intravenous  Once 06/18/22 2317 06/19/22 0007   06/18/22 2315  vancomycin (VANCOCIN) IVPB 1000 mg/200 mL premix        1,000 mg 200 mL/hr over 60 Minutes Intravenous  Once 06/18/22 2306 06/19/22 0203       Objective:  Vital Signs  Vitals:   06/20/22 0553 06/20/22 1583 06/20/22 0715  06/20/22 0855  BP: (!) 148/80   137/76  Pulse: (!) 104   97  Resp: 18 18    Temp: 98.6 F (37 C)     TempSrc:      SpO2: 100%     Weight:   (!) 151.2 kg   Height:        Intake/Output Summary (Last 24 hours) at 06/20/2022 1048 Last data filed at 06/20/2022 0300 Gross per 24 hour  Intake 938.33 ml  Output --  Net 938.33 ml    Filed Weights   06/18/22 1755 06/20/22 0715  Weight: (!) 142.9 kg (!) 151.2 kg    General appearance: Awake alert.  In no distress Resp: Normal effort.  Few crackles bilateral bases.  No wheezing or rhonchi. Cardio: S1-S2 is normal regular.  No S3-S4.  No rubs murmurs or bruit GI: Abdomen is soft.  Nontender nondistended.  Bowel sounds are present normal.  No masses organomegaly Extremities: 2+ edema bilateral lower extremities    Lab Results:  Data Reviewed: I have personally  reviewed following labs and reports of the imaging studies  CBC: Recent Labs  Lab 06/18/22 1817 06/19/22 0517 06/20/22 0541  WBC 11.2* 12.6* 12.1*  HGB 7.2* 6.9* 7.6*  HCT 23.1* 22.5* 24.1*  MCV 85.9 88.6 85.5  PLT 245 232 193     Basic Metabolic Panel: Recent Labs  Lab 06/18/22 1817 06/19/22 0240 06/19/22 0517 06/20/22 0541  NA 132* 132* 134* 131*  K 3.2* 4.3 3.4* 3.3*  CL 105 105 105 104  CO2 15* 14* 17* 14*  GLUCOSE 242* 232* 164* 148*  BUN 91* 92* 92* 89*  CREATININE 4.84* 4.89* 4.82* 4.83*  CALCIUM 8.2* 7.9* 8.1* 8.1*     GFR: Estimated Creatinine Clearance: 19.3 mL/min (A) (by C-G formula based on SCr of 4.83 mg/dL (H)).  Liver Function Tests: Recent Labs  Lab 06/18/22 2132 06/19/22 0517  AST 18 14*  ALT 31 28  ALKPHOS 92 77  BILITOT 0.4 0.8  PROT 7.0 6.5  ALBUMIN 2.9* 2.7*     Recent Labs  Lab 06/18/22 2132  LIPASE 88*     HbA1C: Recent Labs    06/19/22 0240  HGBA1C 8.1*     Thyroid Function Tests: Recent Labs    06/19/22 0240  TSH 1.659     Recent Results (from the past 240 hour(s))  SARS Coronavirus 2 by RT PCR (hospital order, performed in [facility] hospital lab) *cepheid single result test* Anterior Nasal Swab     Status: None   Collection Time: 06/18/22  9:16 PM   Specimen: Anterior Nasal Swab  Result Value Ref Range Status   SARS Coronavirus 2 by RT PCR NEGATIVE NEGATIVE Final    Comment: (NOTE) SARS-CoV-2 target nucleic acids are NOT DETECTED.  The SARS-CoV-2 RNA is generally detectable in upper and lower respiratory specimens during the acute phase of infection. The lowest concentration of SARS-CoV-2 viral copies this assay can detect is 250 copies / mL. A negative result does not preclude SARS-CoV-2 infection and should not be used as the sole basis for treatment or other patient management decisions.  A negative result may occur with improper specimen collection / handling, submission of specimen other than  nasopharyngeal swab, presence of viral mutation(s) within the areas targeted by this assay, and inadequate number of viral copies (<250 copies / mL). A negative result must be combined with clinical observations, patient history, and epidemiological information.  Fact Sheet for Patients:   RoadLapTop.co.za  Fact Sheet for Healthcare Providers: http://kim-miller.com/  This test is not yet approved or  cleared by the [place] FDA and has been authorized for detection and/or diagnosis of SARS-CoV-2 by FDA under an Emergency Use Authorization (EUA).  This EUA will remain in effect (meaning this test can be used) for the duration of the COVID-19 declaration under Section 564(b)(1) of the Act, 21 U.S.C. section 360bbb-3(b)(1), unless the authorization is terminated or revoked sooner.  Performed at [facility], [address] [place], [place] [address]   Blood culture (routine x 2)     Status: None (Preliminary result)   Collection Time: 06/18/22 11:29 PM   Specimen: BLOOD  Result Value Ref Range Status   Specimen Description   Final    BLOOD BLOOD LEFT ARM Performed at [facility], [address] [place], [place] [address]    Special Requests   Final    BOTTLES DRAWN AEROBIC ONLY Blood Culture adequate volume Performed at [facility], [address], [place], [place] [address]    Culture   Final    NO GROWTH 1 DAY Performed at [facility], [address] [place], [place] [address]    Report Status PENDING  Incomplete  Blood culture (routine x 2)     Status: None (Preliminary result)   Collection Time: 06/18/22 11:34 PM   Specimen: BLOOD  Result Value Ref Range Status   Specimen Description   Final    BLOOD BLOOD LEFT ARM Performed at [facility], [address] [place], [place] [address]    Special Requests   Final    BOTTLES DRAWN  AEROBIC AND ANAEROBIC Blood Culture adequate volume Performed at [facility], [address], [place], [place] [address]    Culture   Final    NO GROWTH 1 DAY Performed at [facility], [address] [place], [place] [address]    Report Status PENDING  Incomplete  Respiratory (~20 pathogens) panel by PCR     Status: Abnormal   Collection Time: 06/19/22  4:15 PM   Specimen: Nasopharyngeal Swab; Respiratory  Result Value Ref Range Status   Adenovirus NOT DETECTED NOT DETECTED Final   Coronavirus 229E NOT DETECTED NOT DETECTED Final    Comment: (NOTE) The Coronavirus on the Respiratory Panel, DOES NOT test for the novel  Coronavirus (2019 nCoV)    Coronavirus HKU1 NOT DETECTED NOT DETECTED Final   Coronavirus NL63 NOT DETECTED NOT DETECTED Final   Coronavirus OC43 NOT DETECTED NOT DETECTED Final   Metapneumovirus DETECTED (A) NOT DETECTED Final   Rhinovirus / Enterovirus NOT DETECTED NOT DETECTED Final   Influenza A NOT DETECTED NOT DETECTED Final   Influenza B NOT DETECTED NOT DETECTED Final   Parainfluenza Virus 1 NOT DETECTED NOT DETECTED Final   Parainfluenza Virus 2 NOT DETECTED NOT DETECTED Final   Parainfluenza Virus 3 NOT DETECTED NOT DETECTED Final   Parainfluenza Virus 4 NOT DETECTED NOT DETECTED Final   Respiratory Syncytial Virus NOT DETECTED NOT DETECTED Final   Bordetella pertussis NOT DETECTED NOT DETECTED Final   Bordetella Parapertussis NOT DETECTED NOT DETECTED Final   Chlamydophila pneumoniae NOT DETECTED NOT DETECTED Final   Mycoplasma pneumoniae NOT DETECTED NOT DETECTED Final    Comment: Performed at [facility], [address] [place], [place] [address]  MRSA Next Gen by PCR, Nasal     Status: None   Collection Time: 06/19/22  4:15 PM   Specimen: Nasal Mucosa; Nasal Swab  Result Value Ref Range Status   MRSA by PCR Next Gen NOT DETECTED NOT DETECTED Final    Comment: (NOTE) The GeneXpert MRSA Assay (FDA approved for  NASAL specimens only), is one component of a comprehensive MRSA colonization surveillance program. It is not intended to diagnose MRSA infection nor to guide or monitor treatment for MRSA infections. Test performance is not FDA approved in patients less than 64 years old. Performed at [facility], [address], [place], [place] [address]       Radiology Studies: CT CHEST WO CONTRAST  Result Date: 06/19/2022 CLINICAL DATA:  Abnormal x-ray, lung opacities. [name] and cough. EXAM: CT CHEST WITHOUT CONTRAST TECHNIQUE: Multidetector CT imaging of the chest was performed following the standard protocol without IV contrast. RADIATION DOSE REDUCTION: This exam was performed according to the departmental dose-optimization program which includes automated exposure control, adjustment of the mA and/or kV according to patient size and/or use of iterative reconstruction technique. COMPARISON:  05/10/2022. FINDINGS: Cardiovascular: The heart is normal in size and there is no pericardial effusion. A few scattered coronary artery calcifications are noted. The aorta is normal in caliber. The pulmonary trunk is distended suggesting underlying pulmonary artery hypertension. Mediastinum/Nodes: No mediastinal or axillary lymphadenopathy. Evaluation of the hila is limited due to lack of IV contrast. The thyroid gland, trachea, and esophagus are within normal limits. There is a small hiatal hernia. Lungs/Pleura: Patchy infiltrates are noted in the right lower lobe and there is consolidation in the right upper lobe. A few opacities are noted in the left upper lobe. No effusion or pneumothorax. Upper Abdomen: The gallbladder is surgically absent. No acute abnormality. Musculoskeletal: Degenerative changes are present in the thoracic spine. No acute osseous abnormality. IMPRESSION: 1. Findings suggestive of multifocal pneumonia, most pronounced in the right lung. 2. Coronary artery  calcifications. 3. Aortic atherosclerosis. Electronically Signed   By: Thornell Sartorius M.D.   On: 06/19/2022 04:30   CT Renal Stone Study  Result Date: 06/18/2022 CLINICAL DATA:  Diffuse abdominal pain, difficulty breathing. EXAM: CT ABDOMEN AND PELVIS WITHOUT CONTRAST TECHNIQUE: Multidetector CT imaging of the abdomen and pelvis was performed following the standard protocol without IV contrast. RADIATION DOSE REDUCTION: This exam was performed according to the departmental dose-optimization program which includes automated exposure control, adjustment of the mA and/or kV according to patient size and/or use of iterative reconstruction technique. COMPARISON:  06/02/2022. FINDINGS: Lower chest: A few coronary artery calcifications are noted. Patchy infiltrates are noted in the right lower lobe, concerning for pneumonia. Hepatobiliary: No focal liver abnormality is seen. Status post cholecystectomy. No biliary dilatation. Pancreas: Unremarkable. No pancreatic ductal dilatation or surrounding inflammatory changes. Spleen: Normal in size without focal abnormality. Adrenals/Urinary Tract: The adrenal glands are within normal limits. No renal calculus or hydronephrosis bilaterally. The bladder is unremarkable. Stomach/Bowel: There is a small hiatal hernia. Stomach is within normal limits. Appendix is not definitely seen. No evidence of bowel wall thickening, distention, or inflammatory changes. No free air or pneumatosis. A few scattered diverticula are present along the colon without evidence of diverticulitis. Vascular/Lymphatic: Aortic atherosclerosis. A nonspecific prominent lymph node is present in the retroperitoneum on the left at the level of the left kidney measuring 1 cm. Reproductive: There is a stable pedunculated fibroid extending from the uterus on the left. No adnexal mass. Other: No abdominopelvic ascites. Musculoskeletal: Degenerative changes are present in the thoracolumbar spine. No acute osseous  abnormality. IMPRESSION: 1. Patchy infiltrates in the right lower lobe, suspicious for pneumonia. 2. No renal calculus or hydronephrosis. 3. Diverticulosis without diverticulitis. 4. Aortic atherosclerosis. Electronically Signed   By: Thornell Sartorius M.D.   On: 06/18/2022 22:59   DG Chest 2 View  Result Date: 06/18/2022 CLINICAL DATA:  [name] EXAM: CHEST - 2 VIEW COMPARISON:  Chest x-ray dated May 29, 2022 FINDINGS: The heart size and mediastinal contours are within normal limits. New consolidations of the upper and right lower lung. Lateral view is somewhat limited due to arm position. No evidence of pleural effusion or pneumothorax. Advanced degenerative changes of the right shoulder. IMPRESSION: New consolidations of the upper and right lower lung, concerning for infection. Recommend follow-up PA and lateral chest radiograph 6-8 weeks to ensure resolution. Electronically Signed   By: Allegra Lai M.D.   On: 06/18/2022 18:23       LOS: 1 day   Osvaldo Shipper  [facility] Pager on [email or web address]  06/20/2022, 10:48 AM

## 2022-06-20 NOTE — Plan of Care (Signed)
  Problem: Skin Integrity: Goal: Risk for impaired skin integrity will decrease Outcome: Progressing   Problem: Safety: Goal: Ability to remain free from injury will improve Outcome: Progressing   Problem: Pain Managment: Goal: General experience of comfort will improve Outcome: Progressing   Problem: Coping: Goal: Level of anxiety will decrease Outcome: Progressing   Problem: Activity: Goal: Risk for activity intolerance will decrease Outcome: Progressing   Problem: Clinical Measurements: Goal: Ability to maintain clinical measurements within normal limits will improve Outcome: Progressing   Problem: Education: Goal: Knowledge of General Education information will improve Description: Including pain rating scale, medication(s)/side effects and non-pharmacologic comfort measures Outcome: Progressing

## 2022-06-20 NOTE — Evaluation (Signed)
Occupational Therapy Evaluation Patient Details Name: Deborah [name] MRN: [id number] DOB: 08/03/1965 Today's Date: 06/20/2022   History of Present Illness Patient is a 57 year old female who presented with SOB and increased edema. Of note patient had a recent d/c from SNF. Patient was admitted with pneumonia, metapneumovirus infection, PMH: DM, PAD, CKD, chronic pain, duodenal carcinoid tumor resected in 2021   Clinical Impression   Patient evaluated by Occupational Therapy with no further acute OT needs identified. All education has been completed and the patient has no further questions. Patient endorsed being at baseline and not needing OT services.  See below for any follow-up Occupational Therapy or equipment needs. OT is signing off. Thank you for this referral.       Recommendations for follow up therapy are one component of a multi-disciplinary discharge planning process, led by the attending physician.  Recommendations may be updated based on patient status, additional functional criteria and insurance authorization.   Assistance Recommended at Discharge Intermittent Supervision/Assistance  Patient can return home with the following Assistance with cooking/housework;Assist for transportation;Help with stairs or ramp for entrance;A little help with bathing/dressing/bathroom    Functional Status Assessment  Patient has not had a recent decline in their functional status        Precautions / Restrictions Precautions Precautions: Fall Restrictions Weight Bearing Restrictions: No      Mobility Bed Mobility Overal bed mobility: Modified Independent             General bed mobility comments: no physical assist needed           Balance Overall balance assessment: No apparent balance deficits (not formally assessed)         ADL either performed or assessed with clinical judgement   ADL Overall ADL's : At baseline     General ADL Comments: patient is able to  transfer herself to and from [facility] from hosptial bed with patient able to control HOB to optimize positioning. patient stood to lower bed from height it was on standing with no LOB. patient reported she does not do LB dressing at home unless needed. patient reported she is at her baseline with caregiver coming 6 days a week 3 hours a day. patient endorsed being able to get around apartment at wheelchair level and reported she did not need therapy at this time. OT signing off.      Pertinent Vitals/Pain Pain Assessment Pain Assessment: 0-10 Pain Score: 10-Worst pain ever Pain Location: stomach and L side under breast Pain Descriptors / Indicators: Sharp, Aching Pain Intervention(s): Limited activity within patient's tolerance, Monitored during session, Repositioned, Premedicated before session     Hand Dominance Right   Extremity/Trunk Assessment Upper Extremity Assessment Upper Extremity Assessment: LUE deficits/detail LUE Deficits / Details: shoulder pain in this UE with limited FF adn ABduction. patient reported this has been this way for months. LUE: Unable to fully assess due to pain   Lower Extremity Assessment Lower Extremity Assessment: Defer to PT evaluation   Cervical / Trunk Assessment Cervical / Trunk Assessment: Kyphotic   Communication Communication Communication: No difficulties   Cognition Arousal/Alertness: Awake/alert Behavior During Therapy: Flat affect Overall Cognitive Status: Within Functional Limits for tasks assessed                      Home Living   Living Arrangements: Alone Available Help at Discharge: Personal care attendant;Available PRN/intermittently Type of Home: Apartment Home Access: Stairs to enter [employer] of Steps:  1 Entrance Stairs-Rails: Can reach both Home Layout: One level     Bathroom Shower/Tub: Tub/shower unit         Home Equipment: BSC/3in1;Shower seat;Wheelchair - manual   Additional Comments:  patient has PCA 6 days a week 3 hours a day. patient is going to look into CNA assistance at home.      Prior Functioning/Environment Prior Level of Function : Needs assist               ADLs Comments: wheelchair level at home. can do bathing and dressing tasks does not wear socks.                 OT Goals(Current goals can be found in the care plan section) Acute Rehab OT Goals OT Goal Formulation: All assessment and education complete, DC therapy  OT Frequency:      Co-evaluation PT/OT/SLP Co-Evaluation/Treatment: Yes Reason for Co-Treatment: Other (comment) (patients cooperation level)          AM-PAC OT "6 Clicks" Daily Activity     Outcome Measure Help from another person eating meals?: None Help from another person taking care of personal grooming?: None Help from another person toileting, which includes using toliet, bedpan, or urinal?: None Help from another person bathing (including washing, rinsing, drying)?: None Help from another person to put on and taking off regular upper body clothing?: None Help from another person to put on and taking off regular lower body clothing?: None 6 Click Score: 24   End of Session Equipment Utilized During Treatment: Other (comment) (BSC) Nurse Communication: Mobility status  Activity Tolerance: Patient tolerated treatment well Patient left: in bed;with call bell/phone within reach;with bed alarm set                   Time: 4920-1007 OT Time Calculation (min): 22 min Charges:  OT General Charges $OT Visit: 1 Visit OT Evaluation $OT Eval Low Complexity: 1 Low  Yakir Wenke OTR/L, MS Acute Rehabilitation Department Office# [phone]   Selinda Flavin 06/20/2022, 12:22 PM

## 2022-06-20 NOTE — Evaluation (Signed)
Physical Therapy One Time Evaluation Patient Details Name: Deborah [name] MRN: [id number] DOB: May 10, 1965 Today's Date: 06/20/2022  History of Present Illness  Patient is a 57 year old female who presented with SOB and increased edema. Of note patient had a recent d/c from SNF. Patient was admitted with pneumonia, metapneumovirus infection, PMH: DM, PAD, CKD, chronic pain, duodenal carcinoid tumor resected in 2021  Clinical Impression  Patient evaluated by Physical Therapy with no further acute PT needs identified. All education has been completed and the patient has no further questions. Pt able to perform hygiene on BSC and transfer BSC back to bed.  Pt reports being at mobility baseline and has caregiver assist at home if needed.  PT is signing off. Thank you for this referral.        Recommendations for follow up therapy are one component of a multi-disciplinary discharge planning process, led by the attending physician.  Recommendations may be updated based on patient status, additional functional criteria and insurance authorization.  Follow Up Recommendations       Assistance Recommended at Discharge    Patient can return home with the following       Equipment Recommendations None recommended by PT  Recommendations for Other Services       Functional Status Assessment Patient has not had a recent decline in their functional status     Precautions / Restrictions Precautions Precautions: Fall Restrictions Weight Bearing Restrictions: No      Mobility  Bed Mobility Overal bed mobility: Modified Independent             General bed mobility comments: pt self adjusts bed    Transfers Overall transfer level: Modified independent                 General transfer comment: pt able to transfer [place] to bed and self adjusts bed    Ambulation/Gait               General Gait Details: w/c mobility at baseline  Stairs            Wheelchair  Mobility    Modified Rankin (Stroke Patients Only)       Balance                                             Pertinent Vitals/Pain Pain Assessment Pain Assessment: 0-10 Pain Score: 10-Worst pain ever Pain Location: stomach and L side under breast Pain Descriptors / Indicators: Sharp, Aching Pain Intervention(s): Repositioned, Monitored during session (reports not yet due for pain meds, states pain from PNA)    Home Living   Living Arrangements: Alone Available Help at Discharge: Personal care attendant;Available PRN/intermittently Type of Home: Apartment Home Access: Stairs to enter Entrance Stairs-Rails: Can reach both Entrance Stairs-Number of Steps: 1   Home Layout: One level Home Equipment: BSC/3in1;Shower seat;Wheelchair - manual Additional Comments: patient has PCA 6 days a week 3 hours a day. patient is going to look into CNA assistance at home.    Prior Function Prior Level of Function : Needs assist               ADLs Comments: wheelchair level at home. can do bathing and dressing tasks does not wear socks.     Hand Dominance   Dominant Hand: Right    Extremity/Trunk Assessment   Upper Extremity Assessment  Upper Extremity Assessment: LUE deficits/detail LUE Deficits / Details: shoulder pain in this UE with limited FF adn ABduction. patient reported this has been this way for months. LUE: Unable to fully assess due to pain    Lower Extremity Assessment Lower Extremity Assessment: Overall WFL for tasks assessed    Cervical / Trunk Assessment Cervical / Trunk Assessment: Kyphotic  Communication   Communication: No difficulties  Cognition Arousal/Alertness: Awake/alert Behavior During Therapy: Flat affect Overall Cognitive Status: Within Functional Limits for tasks assessed                                          General Comments      Exercises     Assessment/Plan    PT Assessment Patient does not  need any further PT services  PT Problem List         PT Treatment Interventions      PT Goals (Current goals can be found in the Care Plan section)  Acute Rehab PT Goals PT Goal Formulation: All assessment and education complete, DC therapy    Frequency       Co-evaluation PT/OT/SLP Co-Evaluation/Treatment: Yes Reason for Co-Treatment: To address functional/ADL transfers PT goals addressed during session: Mobility/safety with mobility OT goals addressed during session: ADL's and self-care       AM-PAC PT "6 Clicks" Mobility  Outcome Measure Help needed turning from your back to your side while in a flat bed without using bedrails?: None Help needed moving from lying on your back to sitting on the side of a flat bed without using bedrails?: None Help needed moving to and from a bed to a chair (including a wheelchair)?: None Help needed standing up from a chair using your arms (e.g., wheelchair or bedside chair)?: None Help needed to walk in hospital room?: Total Help needed climbing 3-5 steps with a railing? : Total 6 Click Score: 18    End of Session   Activity Tolerance: Patient tolerated treatment well Patient left: in bed;with call bell/phone within reach   PT Visit Diagnosis: Muscle weakness (generalized) (M62.81)    Time: 7564-3329 PT Time Calculation (min) (ACUTE ONLY): 22 min   Charges:   PT Evaluation $PT Eval Low Complexity: 1 Low         Kati PT, DPT Physical Therapist Acute Rehabilitation Services Office: [phone]   Janan Halter [place] 06/20/2022, 12:33 PM

## 2022-06-20 NOTE — Plan of Care (Signed)
  Problem: Respiratory: Goal: Ability to maintain adequate ventilation will improve Outcome: Progressing   Problem: Safety: Goal: Ability to remain free from injury will improve Outcome: Progressing   Problem: Activity: Goal: Ability to tolerate increased activity will improve Outcome: Not Progressing

## 2022-06-20 NOTE — Progress Notes (Signed)
Admit: 06/18/2022 LOS: 1  2F AoCKD4, multifocal pneumonia likley 2/2 metapneumovirus, recent admission for disseminated gout with encephalopathy and AKI, just discharged from SNF.   Subjective:  Seems better this AM Swelling still bothersome but 'better' UOP not quantified AM labs stable GFR, K 3.3, BUN 89 2u PRBC yesterday, hb 6.9 to 7.6  04/13 0701 - 04/14 0700 In: 938.3 [I.V.:42.3; Blood:796; IV Piggyback:100] Out: -   Filed Weights   06/18/22 1755  Weight: (!) 142.9 kg    Scheduled Meds:  allopurinol  50 mg Oral Daily   apixaban  5 mg Oral BID   atorvastatin  10 mg Oral Daily   carvedilol  12.5 mg Oral BID WC   colchicine  0.3 mg Oral Daily   donepezil  5 mg Oral QHS   febuxostat  40 mg Oral Daily   ferrous sulfate  325 mg Oral BID WC   folic acid  1 mg Oral Daily   furosemide  80 mg Intravenous Daily   mirtazapine  15 mg Oral QHS   pantoprazole  40 mg Oral BID   potassium chloride  20 mEq Oral Once   predniSONE  20 mg Oral Q breakfast   vancomycin variable dose per unstable renal function (pharmacist dosing)   Does not apply See admin instructions   Continuous Infusions:  ceFEPime (MAXIPIME) IV 2 g (06/19/22 2316)   PRN Meds:.albuterol, guaiFENesin-dextromethorphan, HYDROmorphone (DILAUDID) injection, ondansetron **OR** ondansetron (ZOFRAN) IV  Current Labs: reviewed    Physical Exam:  Blood pressure (!) 148/80, pulse (!) 104, temperature 98.6 F (37 C), resp. rate 18, height 5\' 6"  (1.676 m), weight (!) 142.9 kg, last menstrual period 10/10/2012, SpO2 100 %. GEN: Chronically ill-appearing, morbidly obese female, sitting at edge of bed appears more comfortable than yesterday ENT: NCAT EYES: EOMI CV: Regular, tachycardic, no rub, normal S1 and S2 PULM: Diminished in the bases, distant throughout ABD: Mildly tender to palpation throughout even with superficial touch SKIN: Has an unknown ruptured blister over left medial calcaneal region, no other wounds  identified EXT:2+ edema in the legs, trace to 1+ in the presacral region  A AoCKD4: Has significant GFR mobility.  Imaging at presentation without evidence of obstruction.  Follows with Thedore Mins at [facility] Multifocal pneumonia especially in right lung started vancomycin and cefepime per TRH, BCx NGTD.  Lower extremity edema, weight gain.  Clear lungs on imaging for edema.  Uses torsemide as outpatient Hypertension on amlodipine, hydralazine, carvedilol Gout with severe hyperuricemia on prednisone colchicine and allopurinol Anemia, worsening, transfuse per TRH History of HFpEF, normal LVEF DM2 with history of HHS Atrial fibrillation on rate control and apixaban Lumbar spinal stenosis. Chronic pain on narcotics  [employer] Lasix to 80 IV BID KCl this AM Medication Issues; Preferred narcotic agents for pain control are hydromorphone, fentanyl, and methadone. Morphine should not be used.  Baclofen should be avoided Avoid oral sodium phosphate and magnesium citrate based laxatives / bowel preps    [name] Heck MD 06/20/2022, 7:07 AM  Recent Labs  Lab 06/19/22 0240 06/19/22 0517 06/20/22 0541  NA 132* 134* 131*  K 4.3 3.4* 3.3*  CL 105 105 104  CO2 14* 17* 14*  GLUCOSE 232* 164* 148*  BUN 92* 92* 89*  CREATININE 4.89* 4.82* 4.83*  CALCIUM 7.9* 8.1* 8.1*   Recent Labs  Lab 06/18/22 1817 06/19/22 0517 06/20/22 0541  WBC 11.2* 12.6* 12.1*  HGB 7.2* 6.9* 7.6*  HCT 23.1* 22.5* 24.1*  MCV 85.9 88.6 85.5  PLT 245 232 193

## 2022-06-20 NOTE — Consult Note (Signed)
WOC Nurse Consult Note: Reason for Consult: Stage 2 pressure injuries noted on admission to left heel (intact serum filled blister) and sacrum Wound type:pressure Pressure Injury POA: Yes Measurement:Bedside RN to measure and document measurements on Nursing Flow Sheet with next dressing placement. Wound bed: As noted above, intact serum filled blister to left heel, skin tear-like presentation to sacrum Drainage (amount, consistency, odor) none to left heel, small serous to sacrum Periwound:intact Dressing procedure/placement/frequency:I have provided bilateral pressure redistribution heel boots. Turning and repositioning is in place. Both wounds will be cleansed daily and covered with xeroform gauze (antimicrobial, nonadherent). The heel will be topped with gauze and secured with a few turns of Kerlix roll gauze and secured with paper tape. The sacrum will be topped with dry gauze and secured with a silicone foam. Turning and repositioning is in place.   WOC nursing team will not follow, but will remain available to this patient, the nursing and medical teams.  Please re-consult if needed.  Thank you for inviting [place] to participate in this patient's Plan of Care.  Ladona Mow, MSN, RN, CNS, GNP, Leda Min, [employer], [employer] phone:  [phone]

## 2022-06-21 ENCOUNTER — Encounter: Payer: 59 | Admitting: Registered Nurse

## 2022-06-21 DIAGNOSIS — N179 Acute kidney failure, unspecified: Secondary | ICD-10-CM | POA: Diagnosis not present

## 2022-06-21 DIAGNOSIS — J189 Pneumonia, unspecified organism: Secondary | ICD-10-CM | POA: Diagnosis not present

## 2022-06-21 DIAGNOSIS — J123 Human metapneumovirus pneumonia: Secondary | ICD-10-CM | POA: Diagnosis not present

## 2022-06-21 LAB — BASIC METABOLIC PANEL
Anion gap: 13 (ref 5–15)
BUN: 93 mg/dL — ABNORMAL HIGH (ref 6–20)
CO2: 14 mmol/L — ABNORMAL LOW (ref 22–32)
Calcium: 8.2 mg/dL — ABNORMAL LOW (ref 8.9–10.3)
Chloride: 101 mmol/L (ref 98–111)
Creatinine, Ser: 4.99 mg/dL — ABNORMAL HIGH (ref 0.44–1.00)
GFR, Estimated: 9 mL/min — ABNORMAL LOW (ref >60)
Glucose, Bld: 213 mg/dL — ABNORMAL HIGH (ref 70–99)
Potassium: 3.4 mmol/L — ABNORMAL LOW (ref 3.5–5.1)
Sodium: 128 mmol/L — ABNORMAL LOW (ref 135–145)

## 2022-06-21 LAB — CBC
HCT: 26.1 % — ABNORMAL LOW (ref 36.0–46.0)
Hemoglobin: 8.2 g/dL — ABNORMAL LOW (ref 12.0–15.0)
MCH: 27.1 pg (ref 26.0–34.0)
MCHC: 31.4 g/dL (ref 30.0–36.0)
MCV: 86.1 fL (ref 80.0–100.0)
Platelets: 192 10*3/uL (ref 150–400)
RBC: 3.03 MIL/uL — ABNORMAL LOW (ref 3.87–5.11)
RDW: 20.2 % — ABNORMAL HIGH (ref 11.5–15.5)
WBC: 10.6 10*3/uL — ABNORMAL HIGH (ref 4.0–10.5)
nRBC: 0 % (ref 0.0–0.2)

## 2022-06-21 LAB — LEGIONELLA PNEUMOPHILA SEROGP 1 UR AG: L. pneumophila Serogp 1 Ur Ag: NEGATIVE

## 2022-06-21 LAB — CULTURE, BLOOD (ROUTINE X 2)

## 2022-06-21 LAB — PROCALCITONIN: Procalcitonin: 0.33 ng/mL

## 2022-06-21 MED ORDER — FUROSEMIDE 10 MG/ML IJ SOLN
120.0000 mg | Freq: Two times a day (BID) | INTRAVENOUS | Status: DC
  Administered 2022-06-21 – 2022-06-22 (×2): 120 mg via INTRAVENOUS
  Filled 2022-06-21: qty 10
  Filled 2022-06-21: qty 12
  Filled 2022-06-21: qty 10

## 2022-06-21 MED ORDER — DOXYCYCLINE HYCLATE 100 MG PO TABS
100.0000 mg | ORAL_TABLET | Freq: Two times a day (BID) | ORAL | Status: AC
  Administered 2022-06-21 – 2022-06-25 (×9): 100 mg via ORAL
  Filled 2022-06-21 (×9): qty 1

## 2022-06-21 MED ORDER — GUAIFENESIN 100 MG/5ML PO LIQD
5.0000 mL | ORAL | Status: DC | PRN
  Administered 2022-06-21 – 2022-06-22 (×2): 5 mL via ORAL
  Filled 2022-06-21 (×2): qty 10

## 2022-06-21 MED ORDER — GUAIFENESIN ER 600 MG PO TB12
600.0000 mg | ORAL_TABLET | Freq: Two times a day (BID) | ORAL | Status: DC
  Administered 2022-06-21 – 2022-07-05 (×29): 600 mg via ORAL
  Filled 2022-06-21 (×29): qty 1

## 2022-06-21 NOTE — Progress Notes (Addendum)
[facility] PROGRESS NOTE   NONIE TYRRELL XBM:[id number] DOB: 03-30-1965 DOA: 06/18/2022  PCP: Elsie Amis, MD  Brief History/Interval Summary: 57 y.o. female with medical history significant for  insulin-dependent diabetes mellitus with hx of hypertension, PAF on Eliquis, CKD stage IV, iron deficiency anemia, chronic pain, duodenal carcinoid tumor resected in 2021. Patient also has interim history of admission 05/28/22 -06/04/22 with diagnosis of acute metabolic encephalopathy due to dehydration with associated AKI on CKDIV, with course complicated by disseminated gout. S/p treatment patient was discharged on colchine, Uloric  and allopurinol as well as steroid taper. Patient s/p  hospitalization was discharge to SNF. Patient now presents s/p d/c from snf with complaint of worsening sob and swelling over the last 24 hours. Patient also notes cough and left lateral side pain.   Consultants: Nephrology  Procedures: None yet    Subjective/Interval History: Patient mentioned that she continues to have a cough.  Not making as much urine as expected being on twice a day furosemide.  Swelling persists.  [name] is about the same with no changes.  No chest pain.     Assessment/Plan:  Pneumonia in the setting of recent hospital admission/metapneumovirus infection Imaging studies showed multifocal opacities.  COVID-19 PCR was negative.  She is afebrile.  WBC was noted to be elevated.   Patient was started on broad-spectrum antibiotics and remains on vancomycin and cefepime.  Respiratory viral panel positive for metapneumovirus which could be the reason for her pneumonia.  Lactic acid level was normal.   Procalcitonin 0.33.  Vancomycin discontinued.  Blood cultures negative so far.  Change from cefepime to doxycycline.   Respiratory status is stable.  Acute kidney injury on chronic kidney disease stage IV/concern for fluid  overload/hypokalemia/hyponatremia When she was last hospitalized she was discharged with a creatinine of 2.2.  Came in with a creatinine of 4.8.  Has gained a lot of weight according to the patient, about 15 pounds.  Also has worsening lower extremity edema. Nephrology consulted.  Furosemide dose changed to 80 mg twice a day.   Monitor renal function closely.  Ins and outs Daily weights. No change in weight noted.  Actually appears to have gone up slightly. Echocardiogram from July 2023 showed normal left ventricular systolic function. Fluid overload is likely due to worsening renal function. CT renal study did not show any hydronephrosis. Torsemide on hold.  Sodium bicarbonate on hold per nephrology. Further management per nephrology including management of electrolytes.  Anemia of chronic kidney disease/acute on chronic anemia Drop in hemoglobin noted.  When she was discharged last month her hemoglobin was 9.6.  Came in with a hemoglobin of 7.2.  Hemoglobin noted to be 6.9 on 4/13..  Transfused 2 units of PRBC.  Hemoglobin is 8.2 today.  No evidence of overt bleeding.  Likely due to her kidney disease.  Apparently supposed to follow-up with hematology for her longstanding anemia.  Recently check anemia panel in March reviewed.  Left-sided abdominal pain CT renal study did not show any obvious abnormalities.  Diverticulosis was noted but no diverticulitis.  She does not have any significant stool burden.  Will continue to monitor for now.  LFTs were noted to be unremarkable.  Lipase level was noted to be mildly elevated but clinical significance is unclear.  Could have been elevated due to her renal dysfunction.  She is not experiencing pain in the epigastric area. Abdomen remains benign.  Does not have any significant discomfort.  Continue to  monitor.  She is tolerating her diet.  Insulin-dependent diabetes mellitus Currently on SSI.  Monitor CBGs.  HbA1c 8.1.  Paroxysmal atrial  fibrillation Continue Eliquis and metoprolol.  No evidence of overt bleeding.  Essential hypertension Blood pressure is reasonably well-controlled.  It appears that she is on amlodipine carvedilol hydralazine prior to admission.  These are currently on hold.  History of gout with recent disseminated gout Continue allopurinol colchicine and also on Uloric. Uric acid level still elevated at 8.0 but better than before.  Uric acid level was 16.5 on March 23.  Had improved to 10.4 on March 28.   Continue current management for now. Noted to be on prednisone as well.  Will complete course tomorrow.   Chronic pain syndrome On oral hydromorphone chronically.  Currently getting intravenously.  History of duodenal carcinoid tumor Status post resection previously.  History of lumbar spinal stenosis Has chronic bilateral lower extremity weakness.  Is wheelchair-bound.  Has been imaged previously and MRI has been discussed with neurosurgery.  Outpatient follow-up was recommended.  PT and OT following.  Obesity Estimated body mass index is 54.51 kg/m as calculated from the following:   Height as of this encounter:  (1.676 m).   Weight as of this encounter: 153.2 kg.   DVT Prophylaxis: Eliquis  Code Status: Full code Family Communication: Discussed with the patient Disposition Plan:  She is here from a skilled nursing facility but tells me that she was supposed to be released.  Seen by physical therapy.  No follow-up indicated.  Status is: Inpatient Remains inpatient appropriate because: Acute kidney injury      Medications: Scheduled:  [START ON 06/23/2022] allopurinol  50 mg Oral Once per day on Sun Wed   apixaban  5 mg Oral BID   atorvastatin  10 mg Oral Daily   carvedilol  12.5 mg Oral BID WC   donepezil  5 mg Oral QHS   febuxostat  40 mg Oral Daily   ferrous sulfate  325 mg Oral BID WC   folic acid  1 mg Oral Daily   furosemide  80 mg Intravenous Q12H   guaiFENesin  600 mg  Oral BID   mirtazapine  30 mg Oral QHS   pantoprazole  40 mg Oral BID   predniSONE  20 mg Oral Q breakfast   Continuous:  ceFEPime (MAXIPIME) IV 2 g (06/20/22 2304)   [id number], guaiFENesin, HYDROmorphone (DILAUDID) injection, loperamide, ondansetron **OR** ondansetron (ZOFRAN) IV  Antibiotics: Anti-infectives (From admission, onward)    Start     Dose/Rate Route Frequency Ordered Stop   06/19/22 2300  ceFEPIme (MAXIPIME) 2 g in sodium chloride 0.9 % 100 mL IVPB        2 g 200 mL/hr over 30 Minutes Intravenous Every 24 hours 06/19/22 0156     06/19/22 0200  vancomycin (VANCOREADY) IVPB 1500 mg/300 mL        1,500 mg 150 mL/hr over 120 Minutes Intravenous  Once 06/19/22 0156 06/19/22 0622   06/19/22 0157  vancomycin variable dose per unstable renal function (pharmacist dosing)  Status:  Discontinued         Does not apply See admin instructions 06/19/22 0157 06/20/22 1123   06/18/22 2330  ceFEPIme (MAXIPIME) 2 g in sodium chloride 0.9 % 100 mL IVPB        2 g 200 mL/hr over 30 Minutes Intravenous  Once 06/18/22 2317 06/19/22 0007   06/18/22 2315  vancomycin (VANCOCIN) IVPB 1000 mg/200 mL premix  1,000 mg 200 mL/hr over 60 Minutes Intravenous  Once 06/18/22 2306 06/19/22 0203       Objective:  Vital Signs  Vitals:   06/20/22 2219 06/21/22 0458 06/21/22 0500 06/21/22 0920  BP: 117/67 133/70  130/72  Pulse: 95 97  99  Resp: 20 18    Temp: (!) 97.3 F (36.3 C) 98 F (36.7 C)    TempSrc: Oral Oral    SpO2: 100% 100%    Weight:   (!) 153.2 kg   Height:        Intake/Output Summary (Last 24 hours) at 06/21/2022 0956 Last data filed at 06/20/2022 1900 Gross per 24 hour  Intake 836 ml  Output --  Net 836 ml    Filed Weights   06/18/22 1755 06/20/22 0715 06/21/22 0500  Weight: (!) 142.9 kg (!) 151.2 kg (!) 153.2 kg    General appearance: Awake alert.  In no distress Resp: Clear to auscultation bilaterally.  Normal effort Cardio: S1-S2 is normal regular.   No S3-S4.  No rubs murmurs or bruit GI: Abdomen is soft.  Nontender nondistended.  Bowel sounds are present normal.  No masses organomegaly Extremities: Continues to have significant edema bilateral lower extremities. No focal neurological deficits.   Lab Results:  Data Reviewed: I have personally reviewed following labs and reports of the imaging studies  CBC: Recent Labs  Lab 06/18/22 1817 06/19/22 0517 06/20/22 0541 06/21/22 0503  WBC 11.2* 12.6* 12.1* 10.6*  HGB 7.2* 6.9* 7.6* 8.2*  HCT 23.1* 22.5* 24.1* 26.1*  MCV 85.9 88.6 85.5 86.1  PLT 245 232 193 192     Basic Metabolic Panel: Recent Labs  Lab 06/18/22 1817 06/19/22 0240 06/19/22 0517 06/20/22 0541 06/21/22 0503  NA 132* 132* 134* 131* 128*  K 3.2* 4.3 3.4* 3.3* 3.4*  CL 105 105 105 104 101  CO2 15* 14* 17* 14* 14*  GLUCOSE 242* 232* 164* 148* 213*  BUN 91* 92* 92* 89* 93*  CREATININE 4.84* 4.89* 4.82* 4.83* 4.99*  CALCIUM 8.2* 7.9* 8.1* 8.1* 8.2*     GFR: Estimated Creatinine Clearance: 18.8 mL/min (A) (by C-G formula based on SCr of 4.99 mg/dL (H)).  Liver Function Tests: Recent Labs  Lab 06/18/22 2132 06/19/22 0517  AST 18 14*  ALT 31 28  ALKPHOS 92 77  BILITOT 0.4 0.8  PROT 7.0 6.5  ALBUMIN 2.9* 2.7*     Recent Labs  Lab 06/18/22 2132  LIPASE 88*     HbA1C: Recent Labs    06/19/22 0240  HGBA1C 8.1*     Thyroid Function Tests: Recent Labs    06/19/22 0240  TSH 1.659     Recent Results (from the past 240 hour(s))  SARS Coronavirus 2 by RT PCR (hospital order, performed in [facility] hospital lab) *cepheid single result test* Anterior Nasal Swab     Status: None   Collection Time: 06/18/22  9:16 PM   Specimen: Anterior Nasal Swab  Result Value Ref Range Status   SARS Coronavirus 2 by RT PCR NEGATIVE NEGATIVE Final    Comment: (NOTE) SARS-CoV-2 target nucleic acids are NOT DETECTED.  The SARS-CoV-2 RNA is generally detectable in upper and lower respiratory  specimens during the acute phase of infection. The lowest concentration of SARS-CoV-2 viral copies this assay can detect is 250 copies / mL. A negative result does not preclude SARS-CoV-2 infection and should not be used as the sole basis for treatment or other patient management decisions.  A negative  result may occur with improper specimen collection / handling, submission of specimen other than nasopharyngeal swab, presence of viral mutation(s) within the areas targeted by this assay, and inadequate number of viral copies (<250 copies / mL). A negative result must be combined with clinical observations, patient history, and epidemiological information.  Fact Sheet for Patients:   RoadLapTop.co.za  Fact Sheet for Healthcare Providers: http://kim-miller.com/  This test is not yet approved or  cleared by the [place] FDA and has been authorized for detection and/or diagnosis of SARS-CoV-2 by FDA under an Emergency Use Authorization (EUA).  This EUA will remain in effect (meaning this test can be used) for the duration of the COVID-19 declaration under Section 564(b)(1) of the Act, 21 U.S.C. section 360bbb-3(b)(1), unless the authorization is terminated or revoked sooner.  Performed at [facility], [address], [place], [place] [address]   Blood culture (routine x 2)     Status: None (Preliminary result)   Collection Time: 06/18/22 11:29 PM   Specimen: BLOOD  Result Value Ref Range Status   Specimen Description   Final    BLOOD BLOOD LEFT ARM Performed at [facility], [address] [place], [place] [address]    Special Requests   Final    BOTTLES DRAWN AEROBIC ONLY Blood Culture adequate volume Performed at [facility], [address], [place], [place] [address]    Culture   Final    NO GROWTH 2 DAYS Performed at [facility], [address]  [place], [place] [address]    Report Status PENDING  Incomplete  Blood culture (routine x 2)     Status: None (Preliminary result)   Collection Time: 06/18/22 11:34 PM   Specimen: BLOOD  Result Value Ref Range Status   Specimen Description   Final    BLOOD BLOOD LEFT ARM Performed at [facility], [address] [place], [place] [address]    Special Requests   Final    BOTTLES DRAWN AEROBIC AND ANAEROBIC Blood Culture adequate volume Performed at [facility], [address] [place], [place] [address]    Culture   Final    NO GROWTH 2 DAYS Performed at [facility], [address], [place], [place] [address]    Report Status PENDING  Incomplete  Respiratory (~20 pathogens) panel by PCR     Status: Abnormal   Collection Time: 06/19/22  4:15 PM   Specimen: Nasopharyngeal Swab; Respiratory  Result Value Ref Range Status   Adenovirus NOT DETECTED NOT DETECTED Final   Coronavirus 229E NOT DETECTED NOT DETECTED Final    Comment: (NOTE) The Coronavirus on the Respiratory Panel, DOES NOT test for the novel  Coronavirus (2019 nCoV)    Coronavirus HKU1 NOT DETECTED NOT DETECTED Final   Coronavirus NL63 NOT DETECTED NOT DETECTED Final   Coronavirus OC43 NOT DETECTED NOT DETECTED Final   Metapneumovirus DETECTED (A) NOT DETECTED Final   Rhinovirus / Enterovirus NOT DETECTED NOT DETECTED Final   Influenza A NOT DETECTED NOT DETECTED Final   Influenza B NOT DETECTED NOT DETECTED Final   Parainfluenza Virus 1 NOT DETECTED NOT DETECTED Final   Parainfluenza Virus 2 NOT DETECTED NOT DETECTED Final   Parainfluenza Virus 3 NOT DETECTED NOT DETECTED Final   Parainfluenza Virus 4 NOT DETECTED NOT DETECTED Final   Respiratory Syncytial Virus NOT DETECTED NOT DETECTED Final   Bordetella pertussis NOT DETECTED NOT DETECTED Final  Bordetella Parapertussis NOT DETECTED NOT DETECTED Final   Chlamydophila pneumoniae NOT DETECTED NOT DETECTED Final    Mycoplasma pneumoniae NOT DETECTED NOT DETECTED Final    Comment: Performed at [facility], [address], [place], [place] [address]  MRSA Next Gen by PCR, Nasal     Status: None   Collection Time: 06/19/22  4:15 PM   Specimen: Nasal Mucosa; Nasal Swab  Result Value Ref Range Status   MRSA by PCR Next Gen NOT DETECTED NOT DETECTED Final    Comment: (NOTE) The GeneXpert MRSA Assay (FDA approved for NASAL specimens only), is one component of a comprehensive MRSA colonization surveillance program. It is not intended to diagnose MRSA infection nor to guide or monitor treatment for MRSA infections. Test performance is not FDA approved in patients less than 59 years old. Performed at [facility], [address] [place], [place] [address]       Radiology Studies: No results found.     LOS: 2 days   Gavriella Hearst Rito Ehrlich  [facility] Pager on [email or web address]  06/21/2022, 9:56 AM

## 2022-06-21 NOTE — Progress Notes (Signed)
Admit: 06/18/2022 LOS: 2  69F AoCKD4, multifocal pneumonia likley 2/2 metapneumovirus, recent admission for disseminated gout with encephalopathy and AKI, just discharged from SNF.   Subjective:  No UOP recorded, have ordered I/O's . Creat no change, 4.9. pt alert and w/o n/v or ms changes. Voiding " a little more".   04/14 0701 - 04/15 0700 In: 836 [P.O.:836] Out: -   Filed Weights   06/18/22 1755 06/20/22 0715 06/21/22 0500  Weight: (!) 142.9 kg (!) 151.2 kg (!) 153.2 kg    Scheduled Meds:  [START ON 06/23/2022] allopurinol  50 mg Oral Once per day on Sun Wed   apixaban  5 mg Oral BID   atorvastatin  10 mg Oral Daily   carvedilol  12.5 mg Oral BID WC   donepezil  5 mg Oral QHS   doxycycline  100 mg Oral Q12H   febuxostat  40 mg Oral Daily   ferrous sulfate  325 mg Oral BID WC   folic acid  1 mg Oral Daily   furosemide  80 mg Intravenous Q12H   guaiFENesin  600 mg Oral BID   mirtazapine  30 mg Oral QHS   pantoprazole  40 mg Oral BID   predniSONE  20 mg Oral Q breakfast   Continuous Infusions:   PRN Meds:.albuterol, guaiFENesin, HYDROmorphone (DILAUDID) injection, loperamide, ondansetron **OR** ondansetron (ZOFRAN) IV  Current Labs: reviewed    Physical Exam:  Blood pressure 130/72, pulse 99, temperature 98 F (36.7 C), temperature source Oral, resp. rate 18, height 5\' 6"  (1.676 m), weight (!) 153.2 kg, last menstrual period 10/10/2012, SpO2 100 %. GEN: Chronically ill-appearing, morbidly obese female, sitting at edge of bed appears more comfortable than yesterday ENT: NCAT EYES: EOMI CV: Regular, tachycardic, no rub, normal S1 and S2 PULM: Diminished in the bases, distant throughout ABD: Mildly tender to palpation throughout even with superficial touch SKIN: Has an unknown ruptured blister over left medial calcaneal region, no other wounds identified EXT:2-3+ diffuse LE edema throughout bilat LE's and early edema in sacral region   Date   Creat  eGFR   2018-2020  1.01- 1.39   2021   1.31- 2.70  2022   1.64- 4.38  2023   1.72- 6.25 Mar 2022  3.02- 5.93 8- 17 ml/min  March 2024  1.92- 4.59 10-30 ml/min  4/12   4.84  10 ml/min    4/13   4.82  4/14   4.83  06/20/21  4.99  9 ml/min     B/l creat 2.1- 3.1 from march 2023, eGFR 17- 28 ml/ min   UA 11-20 wbc, 5-10 rbc from 06/19/22, prot 30   CT abd renal --> normal appearing kidneys w/o obstruction    Assessment/ Plan AoCKD4: b/l creat 2.1- 3.1 from march 2023, eGFR 17-28 ml/min. Creat here 4.8 on admission in setting of MPV resp infection and lower ext edema. Followed by Dr Thedore Mins at [facility]. Lasix IV ^'d yesterday from 40 bid to 80 mg bid. Have ordered strict I/O's and will ^IV lasix to 120mg  bid. Keep escalating as needed for volume overload. No uremic signs, no indication for RRT at this time.  Will follow.  Metapneumovirus - w/ multifocal pneumonia especially in right lung started vancomycin and cefepime per [facility], BCx NGTD.  Lower extremity edema - diffuse large amts of edema bilat diffuse LE"s. As above. Clear lungs on imaging.  Uses torsemide as outpatient Hypertension - on amlodipine, hydralazine, carvedilol Gout - with severe hyperuricemia on prednisone  colchicine and allopurinol Anemia -  worsening, transfuse per [facility] History of HFpEF, normal LVEF DM2 with history of HHS Atrial fibrillation - on rate control and apixaban Lumbar spinal stenosis. Chronic pain on narcotics   Deborah Moselle, MD 06/21/2022, 12:04 PM  Recent Labs  Lab 06/18/22 2132 06/19/22 0240 06/19/22 0517 06/20/22 0541 06/21/22 0503  HGB  --   --  6.9* 7.6* 8.2*  ALBUMIN 2.9*  --  2.7*  --   --   CALCIUM  --    < > 8.1* 8.1* 8.2*  CREATININE  --    < > 4.82* 4.83* 4.99*  K  --    < > 3.4* 3.3* 3.4*   < > = values in this interval not displayed.    Inpatient medications:  [START ON 06/23/2022] allopurinol  50 mg Oral Once per day on Sun Wed   apixaban  5 mg Oral BID   atorvastatin  10 mg Oral Daily    carvedilol  12.5 mg Oral BID WC   donepezil  5 mg Oral QHS   doxycycline  100 mg Oral Q12H   febuxostat  40 mg Oral Daily   ferrous sulfate  325 mg Oral BID WC   folic acid  1 mg Oral Daily   furosemide  80 mg Intravenous Q12H   guaiFENesin  600 mg Oral BID   mirtazapine  30 mg Oral QHS   pantoprazole  40 mg Oral BID   predniSONE  20 mg Oral Q breakfast    albuterol, guaiFENesin, HYDROmorphone (DILAUDID) injection, loperamide, ondansetron **OR** ondansetron (ZOFRAN) IV        Recent Labs  Lab 06/19/22 0517 06/20/22 0541 06/21/22 0503  NA 134* 131* 128*  K 3.4* 3.3* 3.4*  CL 105 104 101  CO2 17* 14* 14*  GLUCOSE 164* 148* 213*  BUN 92* 89* 93*  CREATININE 4.82* 4.83* 4.99*  CALCIUM 8.1* 8.1* 8.2*    Recent Labs  Lab 06/19/22 0517 06/20/22 0541 06/21/22 0503  WBC 12.6* 12.1* 10.6*  HGB 6.9* 7.6* 8.2*  HCT 22.5* 24.1* 26.1*  MCV 88.6 85.5 86.1  PLT 232 193 192

## 2022-06-21 NOTE — TOC Initial Note (Signed)
Transition of Care [facility]) - Initial/Assessment Note    Patient Details  Name: Deborah [name] MRN: [id number] Date of Birth: Sep 06, 1965  Transition [facility]) CM/SW Contact:    Adrian Prows, RN Phone Number: 06/21/2022, 4:17 PM  Clinical Narrative:                 [facility] consult for d/c planning; pt says she is from home and she plans to return at d/c; pt says she has transportation; pt also says she has a wheel chair and BSC; her PCP is Dr Nori Riis, Marcy Panning; [facility] will follow.  Expected Discharge Plan: Home/Self Care Barriers to Discharge: Continued Medical Work up   Patient Goals and CMS Choice Patient states their goals for this hospitalization and ongoing recovery are:: home          Expected Discharge Plan and Services   Discharge Planning Services: CM Consult   Living arrangements for the past 2 months: Apartment                                      Prior Living Arrangements/Services Living arrangements for the past 2 months: Apartment Lives with:: Self Patient language and need for interpreter reviewed:: Yes Do you feel safe going back to the place where you live?: Yes      Need for Family Participation in Patient Care: Yes (Comment) Care giver support system in place?: Yes (comment) Current home services: DME (wheel chair, BSC) Criminal Activity/Legal Involvement Pertinent to Current Situation/Hospitalization: No - Comment as needed  Activities of Daily Living Home Assistive Devices/Equipment: Wheelchair ADL Screening (condition at time of admission) Patient's cognitive ability adequate to safely complete daily activities?: Yes Is the patient deaf or have difficulty hearing?: No Does the patient have difficulty seeing, even when wearing glasses/contacts?: No Does the patient have difficulty concentrating, remembering, or making decisions?: No Patient able to express need for assistance with ADLs?: No Does the patient have  difficulty dressing or bathing?: No Independently performs ADLs?: Yes (appropriate for developmental age) Communication: Independent Dressing (OT): Needs assistance Is this a change from baseline?: Pre-admission baseline Grooming: Needs assistance Is this a change from baseline?: Pre-admission baseline Feeding: Independent Bathing: Needs assistance Is this a change from baseline?: Pre-admission baseline Toileting: Needs assistance Is this a change from baseline?: Pre-admission baseline In/Out Bed: Needs assistance Is this a change from baseline?: Pre-admission baseline Walks in Home: Dependent Does the patient have difficulty walking or climbing stairs?: Yes Weakness of Legs: Both Weakness of Arms/Hands: None  Permission Sought/Granted Permission sought to share information with : Case Manager Permission granted to share information with : Yes, Verbal Permission Granted  Share Information with NAME: Burnard Bunting. RN, CM     Permission granted to share info w Relationship: Tyriana Grems (niece) [phone]     Emotional Assessment Appearance:: Other (Comment Required (unable to assess) Attitude/Demeanor/Rapport: Gracious Affect (typically observed): Accepting Orientation: : Oriented to Self, Oriented to Place, Oriented to  Time, Oriented to Situation Alcohol / Substance Use: Not Applicable Psych Involvement: No (comment)  Admission diagnosis:  AKI (acute kidney injury) [N17.9] HCAP (healthcare-associated pneumonia) [J18.9] Congestive heart failure, unspecified HF chronicity, unspecified heart failure type [I50.9] Patient Active Problem List   Diagnosis Date Noted   HCAP (healthcare-associated pneumonia) 06/19/2022   Acute renal failure superimposed on stage 4 chronic kidney disease 05/29/2022   Acute encephalopathy 05/29/2022   Hyponatremia  05/29/2022   Anemia 05/22/2022   Hyperosmolar hyperglycemic state (HHS) 05/11/2022   Hyperosmolar non-ketotic state due to type  2 diabetes mellitus 05/11/2022   Leukocytosis 05/11/2022   Edema of left upper extremity 03/21/2022   Tachycardia 03/18/2022   Atypical chest pain 09/10/2021   Acute pyelonephritis    Acute kidney injury superimposed on chronic kidney disease 08/16/2021   Acute on chronic kidney failure 08/15/2021   PAF (paroxysmal atrial fibrillation)    Chronic pain 09/04/2019   Malignant carcinoid tumor of duodenum    Nonalcoholic fatty liver disease 06/05/2019   Restrictive lung disease secondary to obesity    History of gastric ulcer    Fibroid uterus 02/23/2019   Abdominal pain 07/17/2018   Anxiety 11/29/2017   Spinal stenosis, lumbar region with neurogenic claudication 08/03/2017   GERD (gastroesophageal reflux disease) 03/19/2017   Depression 03/19/2017   Morbid obesity    Normocytic anemia 08/16/2016   Diabetic gastroparesis 08/16/2016   Gout 06/05/2016   Hypomagnesemia and hypokalemia 09/26/2015   Type 2 diabetes mellitus with hyperglycemia, with long-term current use of insulin 05/25/2015   Chronic kidney disease (CKD), stage IV (severe) 05/25/2015   Essential hypertension, benign 09/28/2013   PCP:  Elsie Amis, MD Pharmacy:  No Pharmacies Listed    Social Determinants of Health (SDOH) Social History: SDOH Screenings   Food Insecurity: No Food Insecurity (06/21/2022)  Recent Concern: Food Insecurity - Food Insecurity Present (05/29/2022)  Housing: Low Risk  (06/21/2022)  Recent Concern: Housing - High Risk (05/29/2022)  Transportation Needs: No Transportation Needs (06/21/2022)  Recent Concern: Transportation Needs - Unmet Transportation Needs (06/19/2022)  Utilities: Not At Risk (06/21/2022)  Recent Concern: Utilities - At Risk (06/19/2022)  Depression (PHQ2-9): Medium Risk (09/16/2021)  Tobacco Use: Low Risk  (06/18/2022)   SDOH Interventions: Food Insecurity Interventions: Inpatient TOC Housing Interventions: Inpatient TOC Transportation Interventions: Inpatient  TOC Utilities Interventions: Inpatient TOC   Readmission Risk Interventions     No data to display

## 2022-06-21 NOTE — Progress Notes (Signed)
Patient had  9 beats run of V-tach, on assessment, patient noted sitting in the bed resting, asymptomatic, patient denies any chest pain or distress, vital WNL. Dr. Rito Ehrlich notified, no new orders written, to continue with plan of care. Patient's primary nurse also made aware.

## 2022-06-22 DIAGNOSIS — J189 Pneumonia, unspecified organism: Secondary | ICD-10-CM | POA: Diagnosis not present

## 2022-06-22 LAB — BASIC METABOLIC PANEL
Anion gap: 13 (ref 5–15)
BUN: 102 mg/dL — ABNORMAL HIGH (ref 6–20)
CO2: 13 mmol/L — ABNORMAL LOW (ref 22–32)
Calcium: 8 mg/dL — ABNORMAL LOW (ref 8.9–10.3)
Chloride: 105 mmol/L (ref 98–111)
Creatinine, Ser: 5.04 mg/dL — ABNORMAL HIGH (ref 0.44–1.00)
GFR, Estimated: 9 mL/min — ABNORMAL LOW (ref >60)
Glucose, Bld: 269 mg/dL — ABNORMAL HIGH (ref 70–99)
Potassium: 3.5 mmol/L (ref 3.5–5.1)
Sodium: 131 mmol/L — ABNORMAL LOW (ref 135–145)

## 2022-06-22 LAB — GLUCOSE, CAPILLARY
Glucose-Capillary: 197 mg/dL — ABNORMAL HIGH (ref 70–99)
Glucose-Capillary: 407 mg/dL — ABNORMAL HIGH (ref 70–99)
Glucose-Capillary: 377 mg/dL — ABNORMAL HIGH (ref 70–99)

## 2022-06-22 LAB — CULTURE, BLOOD (ROUTINE X 2)

## 2022-06-22 MED ORDER — INSULIN ASPART 100 UNIT/ML IJ SOLN
0.0000 [IU] | Freq: Three times a day (TID) | INTRAMUSCULAR | Status: DC
  Administered 2022-06-22: 1 [IU] via SUBCUTANEOUS
  Administered 2022-06-23 (×2): 3 [IU] via SUBCUTANEOUS
  Administered 2022-06-23 – 2022-06-24 (×3): 1 [IU] via SUBCUTANEOUS
  Administered 2022-06-25: 4 [IU] via SUBCUTANEOUS

## 2022-06-22 MED ORDER — HYDROMORPHONE HCL 2 MG PO TABS
4.0000 mg | ORAL_TABLET | Freq: Four times a day (QID) | ORAL | Status: DC | PRN
  Administered 2022-06-22 – 2022-07-05 (×45): 4 mg via ORAL
  Filled 2022-06-22 (×46): qty 2

## 2022-06-22 MED ORDER — FUROSEMIDE 10 MG/ML IJ SOLN
120.0000 mg | Freq: Three times a day (TID) | INTRAVENOUS | Status: DC
  Administered 2022-06-22 – 2022-06-25 (×9): 120 mg via INTRAVENOUS
  Filled 2022-06-22: qty 10
  Filled 2022-06-22: qty 12
  Filled 2022-06-22: qty 10
  Filled 2022-06-22: qty 12
  Filled 2022-06-22: qty 10
  Filled 2022-06-22: qty 12
  Filled 2022-06-22 (×2): qty 10
  Filled 2022-06-22: qty 12
  Filled 2022-06-22 (×2): qty 10
  Filled 2022-06-22: qty 2

## 2022-06-22 MED ORDER — INSULIN ASPART 100 UNIT/ML IJ SOLN
3.0000 [IU] | Freq: Once | INTRAMUSCULAR | Status: AC
  Administered 2022-06-22: 3 [IU] via SUBCUTANEOUS

## 2022-06-22 MED ORDER — INSULIN ASPART 100 UNIT/ML IJ SOLN
8.0000 [IU] | Freq: Once | INTRAMUSCULAR | Status: AC
  Administered 2022-06-22: 8 [IU] via SUBCUTANEOUS

## 2022-06-22 MED ORDER — SODIUM BICARBONATE 650 MG PO TABS
650.0000 mg | ORAL_TABLET | Freq: Two times a day (BID) | ORAL | Status: DC
  Administered 2022-06-22: 650 mg via ORAL
  Filled 2022-06-22: qty 1

## 2022-06-22 NOTE — Progress Notes (Addendum)
PROGRESS NOTE    LARAYA PESTKA  WUJ:[id number]  DOB: 1965/11/09  DOA: 06/18/2022 PCP: Elsie Amis, MD Outpatient Specialists:   Hospital course:  57 y.o. female with medical history significant for  insulin-dependent diabetes mellitus with hx of hypertension, PAF on Eliquis, CKD stage IV, iron deficiency anemia, chronic pain, duodenal carcinoid tumor resected in 2021. Patient also has interim history of admission 05/28/22 -06/04/22 with diagnosis of acute metabolic encephalopathy due to dehydration with associated AKI on CKDIV, with course complicated by disseminated gout. S/p treatment patient was discharged on colchine, Uloric  and allopurinol as well as steroid taper. Patient s/p  hospitalization was discharge to SNF. Patient now presents s/p d/c from snf with complaint of worsening sob and swelling over the last 24 hours.  Workup has revealed multiple opacities on chest x-ray, COVID-19 is negative and respiratory viral panel is positive for metapneumovirus infection.   Subjective:  Patient's main concern today is to stay on IV Dilaudid.  Notes that she had been taken off her Dilaudid as an outpatient and now she needs to "build up in my body for longer".  She also notes that she is on Dilaudid 4 mg at home although medicine reconciliation says that she is on 2 mg of Dilaudid.    Objective: Vitals:   06/22/22 0500 06/22/22 0558 06/22/22 0853 06/22/22 1038  BP:  130/73 127/71   Pulse:  97 88   Resp:  18    Temp:  98.1 F (36.7 C)    TempSrc:  Oral    SpO2:  100% 100% 97%  Weight: (!) 153.2 kg     Height:        Intake/Output Summary (Last 24 hours) at 06/22/2022 1624 Last data filed at 06/22/2022 1538 Gross per 24 hour  Intake 364.03 ml  Output 950 ml  Net -585.97 ml   Filed Weights   06/20/22 0715 06/21/22 0500 06/22/22 0500  Weight: (!) 151.2 kg (!) 153.2 kg (!) 153.2 kg     Exam:  General: Obese, somewhat agitated patient lying in bed with  unlabored tachypnea, able to speak in full sentences without difficulty. Eyes: sclera anicteric, conjuctiva mild injection bilaterally CVS: S1-S2, regular  Respiratory: Large body habitus, transmitted upper respiratory sounds GI: Obese, soft, NT to light or deep palpation, no rebound tenderness. LE: Significant edema bilaterally Neuro: A/O x 3,  grossly nonfocal.   Data Reviewed:  Basic Metabolic Panel: Recent Labs  Lab 06/19/22 0240 06/19/22 0517 06/20/22 0541 06/21/22 0503 06/22/22 0509  NA 132* 134* 131* 128* 131*  K 4.3 3.4* 3.3* 3.4* 3.5  CL 105 105 104 101 105  CO2 14* 17* 14* 14* 13*  GLUCOSE 232* 164* 148* 213* 269*  BUN 92* 92* 89* 93* 102*  CREATININE 4.89* 4.82* 4.83* 4.99* 5.04*  CALCIUM 7.9* 8.1* 8.1* 8.2* 8.0*    CBC: Recent Labs  Lab 06/18/22 1817 06/19/22 0517 06/20/22 0541 06/21/22 0503  WBC 11.2* 12.6* 12.1* 10.6*  HGB 7.2* 6.9* 7.6* 8.2*  HCT 23.1* 22.5* 24.1* 26.1*  MCV 85.9 88.6 85.5 86.1  PLT 245 232 193 192     Scheduled Meds:  [START ON 06/23/2022] allopurinol  50 mg Oral Once per day on Sun Wed   apixaban  5 mg Oral BID   atorvastatin  10 mg Oral Daily   carvedilol  12.5 mg Oral BID WC   donepezil  5 mg Oral QHS   doxycycline  100 mg Oral Q12H   febuxostat  40 mg Oral Daily   ferrous sulfate  325 mg Oral BID WC   folic acid  1 mg Oral Daily   guaiFENesin  600 mg Oral BID   insulin aspart  0-6 Units Subcutaneous TID WC   mirtazapine  30 mg Oral QHS   pantoprazole  40 mg Oral BID   sodium bicarbonate  650 mg Oral BID   Continuous Infusions:  furosemide 120 mg (06/22/22 1458)     Assessment & Plan:   Metapneumovirus multifocal pneumonia Heavy chest wall with likely restrictive lung disease Patient had been started on vancomycin and cefepime on admission However blood cultures have been negative, patient has been afebrile Presently on doxycycline, no change in baseline respiratory status  AKI on CKD4  Lower extremity  edema HFpEF Patient had recently been admitted with disseminated gout with encephalopathy and AKI She was admitted with creatinine of 4.8, up from discharge creatinine of 2.2 however has gone up as high as 4.8 during the course of her last admission. Nephrology has been following closely who note that her UA is unremarkable and CT abdomen without obstruction. She is on Lasix 120 3 times daily now, Foley catheter placed for accurate UO. Bicarbonate on hold per nephrology.  Chronic pain Left-sided abdominal pain Lumbar stenosis States she has had chronic pain since she had GI cancer Abdominal exam is benign although complains of pain which is not present with distraction CT renal study was negative for abdominal abnormalities, LFTs WNL Change IV Dilaudid to Dilaudid 4 mg p.o. every 6 hours as needed Patient can be discharged home on her usual home medications of Dilaudid 2 mg p.o. every 8 hours as needed  Hyperuricemia/gout Recent admission for disseminated hyperuricemia and encephalopathy Uric acid now down to 8, was 16.5 at previous admission Continues on colchicine and allopurinol Patient completed her last dose of prednisone this morning.  DM 2 Has history of HHS Continue present management on SSI Hemoglobin A1c is 8.1 NB--just called for blood sugar 400 in patients whose most recent blood sugar was 197.  Patient apparently has been drinking full sugar soda and is angry at the nurses for not getting her drinks from the vending machine as she has requested.  Will treat with aspart 8 units x 1 and follow.  Patient has history is of HHS.   Atrial fibrillation HTN BP and rate controlled on carvedilol Request for stroke prevention    DVT prophylaxis: Eliquis Code Status: Full code Family Communication: None today    Studies: No results found.  Principal Problem:   HCAP (healthcare-associated pneumonia)     Pieter Partridge, [facility]  If 7PM-7AM,  please contact night-coverage [email or web address]   LOS: 3 days

## 2022-06-22 NOTE — Plan of Care (Signed)
  Problem: Respiratory: Goal: Ability to maintain adequate ventilation will improve Outcome: Progressing   Problem: Safety: Goal: Ability to remain free from injury will improve Outcome: Progressing   

## 2022-06-22 NOTE — Plan of Care (Signed)
  Problem: Education: Goal: Knowledge of General Education information will improve Description: Including pain rating scale, medication(s)/side effects and non-pharmacologic comfort measures Outcome: Progressing   Problem: Activity: Goal: Risk for activity intolerance will decrease Outcome: Progressing   Problem: Nutrition: Goal: Adequate nutrition will be maintained Outcome: Progressing   Problem: Elimination: Goal: Will not experience complications related to bowel motility Outcome: Progressing Goal: Will not experience complications related to urinary retention Outcome: Progressing   

## 2022-06-22 NOTE — Progress Notes (Signed)
Admit: 06/18/2022 LOS: 3  45F AoCKD4, multifocal pneumonia likley 2/2 metapneumovirus, recent admission for disseminated gout with encephalopathy and AKI, just discharged from SNF.   Subjective: Creat up slightly, poor UOP < 300 cc but not collecting everything per the patient, they can't fit a "hat" onto her bedside commode. Agrees to foley cath. No n/v or confusion.    Physical Exam:  Blood pressure 127/71, pulse 88, temperature 98.1 F (36.7 C), temperature source Oral, resp. rate 18, height 5\' 6"  (1.676 m), weight (!) 153.2 kg, last menstrual period 10/10/2012, SpO2 97 %. GEN: Chronically ill-appearing, morbidly obese female, sitting at edge of bed appears more comfortable than yesterday ENT: NCAT EYES: EOMI CV: Regular, tachycardic, no rub, normal S1 and S2 PULM: Diminished in the bases, distant throughout ABD: Mildly tender to palpation throughout even with superficial touch SKIN: Has an unknown ruptured blister over left medial calcaneal region, no other wounds identified EXT:2-3+ diffuse LE edema throughout bilat LE's and early edema in sacral region   Date   Creat  eGFR  2018-2020  1.01- 1.39   2021   1.31- 2.70  2022   1.64- 4.38  2023   1.72- 6.25 Mar 2022  3.02- 5.93 8- 17 ml/min  March 2024  1.92- 4.59 10-30 ml/min  4/12   4.84  10 ml/min    4/13   4.82  4/14   4.83  06/20/21  4.99  9 ml/min     B/l creat 2.1- 3.1 from march 2023, eGFR 17- 28 ml/ min   UA 11-20 wbc, 5-10 rbc from 06/19/22, prot 30   CT abd renal --> normal appearing kidneys w/o obstruction    Assessment/ Plan AoCKD4: b/l creat 2.1- 3.1 from march 2023, eGFR 17-28 ml/min. Creat here 4.8 on admission in setting of MPV resp infection and lower ext edema. Followed by Dr Thedore Mins at [facility]. UA unremarkable, CT abd w/o obstruction. Lasix started at 40 mg bid IV and is now up to 120mg  bid. Not able to capture all UOP, will have foley placed. May be approaching dialysis but no strong indication yet. Cont diuresis,  ^Lasix to 120 tid and place foley, f/u labs in am.  Will follow.  Metapneumovirus - w/ multifocal pneumonia R > L, sp IV vanc + cefepime, BCx NGTD. Is now on po doxy. Improving.  Lower extremity edema - diffuse large amts of edema bilat diffuse LE"s. As above. Clear lungs on imaging.  Uses torsemide as outpatient Hypertension - on amlodipine, hydralazine, carvedilol Gout - with severe hyperuricemia on prednisone colchicine and allopurinol Anemia -  worsening, transfuse per TRH History of HFpEF, normal LVEF DM2 with history of HHS Atrial fibrillation - on rate control and apixaban Lumbar spinal stenosis. Chronic pain on narcotics   Vinson Moselle, MD 06/22/2022, 1:19 PM  Recent Labs  Lab 06/18/22 2132 06/19/22 0240 06/19/22 0517 06/20/22 0541 06/21/22 0503 06/22/22 0509  HGB  --   --  6.9* 7.6* 8.2*  --   ALBUMIN 2.9*  --  2.7*  --   --   --   CALCIUM  --    < > 8.1* 8.1* 8.2* 8.0*  CREATININE  --    < > 4.82* 4.83* 4.99* 5.04*  K  --    < > 3.4* 3.3* 3.4* 3.5   < > = values in this interval not displayed.     Inpatient medications:  [START ON 06/23/2022] allopurinol  50 mg Oral Once per day on Sun Wed  apixaban  5 mg Oral BID   atorvastatin  10 mg Oral Daily   carvedilol  12.5 mg Oral BID WC   donepezil  5 mg Oral QHS   doxycycline  100 mg Oral Q12H   febuxostat  40 mg Oral Daily   ferrous sulfate  325 mg Oral BID WC   folic acid  1 mg Oral Daily   guaiFENesin  600 mg Oral BID   insulin aspart  0-6 Units Subcutaneous TID WC   mirtazapine  30 mg Oral QHS   pantoprazole  40 mg Oral BID   sodium bicarbonate  650 mg Oral BID    furosemide     albuterol, guaiFENesin, HYDROmorphone (DILAUDID) injection, loperamide, ondansetron **OR** ondansetron (ZOFRAN) IV

## 2022-06-22 NOTE — Inpatient Diabetes Management (Signed)
Inpatient Diabetes Program Recommendations  AACE/ADA: New Consensus Statement on Inpatient Glycemic Control (2015)  Target Ranges:  Prepandial:   less than 140 mg/dL      Peak postprandial:   less than 180 mg/dL (1-2 hours)      Critically ill patients:  140 - 180 mg/dL   Lab Results  Component Value Date   GLUCAP 197 (H) 06/22/2022   HGBA1C 8.1 (H) 06/19/2022    Review of Glycemic Control  Diabetes history: DM2 Outpatient Diabetes medications: Semglee 20 BID, Humalog 2- 14 units TID Current orders for Inpatient glycemic control: Novolog 0-6 TID  HgbA1C - 8.1%  Inpatient Diabetes Program Recommendations:    Consider adding Semglee 10 units BID  Will continue to follow.  Thank you. Ailene Ards, RD, LDN, CDCES Inpatient Diabetes Coordinator [phone]

## 2022-06-23 ENCOUNTER — Other Ambulatory Visit: Payer: Self-pay | Admitting: Physical Medicine and Rehabilitation

## 2022-06-23 ENCOUNTER — Other Ambulatory Visit: Payer: Self-pay | Admitting: Family Medicine

## 2022-06-23 ENCOUNTER — Encounter: Payer: 59 | Admitting: Registered Nurse

## 2022-06-23 DIAGNOSIS — J189 Pneumonia, unspecified organism: Secondary | ICD-10-CM | POA: Diagnosis not present

## 2022-06-23 LAB — CULTURE, BLOOD (ROUTINE X 2): Special Requests: ADEQUATE

## 2022-06-23 LAB — BASIC METABOLIC PANEL
Anion gap: 13 (ref 5–15)
BUN: 109 mg/dL — ABNORMAL HIGH (ref 6–20)
CO2: 16 mmol/L — ABNORMAL LOW (ref 22–32)
Calcium: 8.2 mg/dL — ABNORMAL LOW (ref 8.9–10.3)
Chloride: 105 mmol/L (ref 98–111)
Creatinine, Ser: 5.13 mg/dL — ABNORMAL HIGH (ref 0.44–1.00)
GFR, Estimated: 9 mL/min — ABNORMAL LOW (ref >60)
Glucose, Bld: 279 mg/dL — ABNORMAL HIGH (ref 70–99)
Potassium: 3.6 mmol/L (ref 3.5–5.1)
Sodium: 134 mmol/L — ABNORMAL LOW (ref 135–145)

## 2022-06-23 LAB — GLUCOSE, CAPILLARY
Glucose-Capillary: 199 mg/dL — ABNORMAL HIGH (ref 70–99)
Glucose-Capillary: 260 mg/dL — ABNORMAL HIGH (ref 70–99)
Glucose-Capillary: 274 mg/dL — ABNORMAL HIGH (ref 70–99)
Glucose-Capillary: 303 mg/dL — ABNORMAL HIGH (ref 70–99)

## 2022-06-23 MED ORDER — CHLORHEXIDINE GLUCONATE CLOTH 2 % EX PADS
6.0000 | MEDICATED_PAD | Freq: Every day | CUTANEOUS | Status: DC
  Administered 2022-06-23 – 2022-07-04 (×9): 6 via TOPICAL

## 2022-06-23 MED ORDER — INSULIN ASPART 100 UNIT/ML IJ SOLN
4.0000 [IU] | Freq: Three times a day (TID) | INTRAMUSCULAR | Status: DC
  Administered 2022-06-24 – 2022-07-02 (×19): 4 [IU] via SUBCUTANEOUS

## 2022-06-23 MED ORDER — INSULIN GLARGINE-YFGN 100 UNIT/ML ~~LOC~~ SOLN
8.0000 [IU] | Freq: Every day | SUBCUTANEOUS | Status: DC
  Administered 2022-06-23 – 2022-06-25 (×3): 8 [IU] via SUBCUTANEOUS
  Filled 2022-06-23 (×5): qty 0.08

## 2022-06-23 NOTE — Progress Notes (Addendum)
Pt previously with Foley catheter. Foley removed today per protocol, however per nephrology pt in need of Foley for monitoring of output and kidney function. Foley placed back per order, pt educated and agreeable to proceed. Foley to stay in place until cleared or removal ordered by nephrology.   Will continue ot monitor.

## 2022-06-23 NOTE — Progress Notes (Signed)
PROGRESS NOTE    Deborah [name]  JJO:[id number] DOB: [birth year] DOA: 06/18/2022 PCP: Elsie Amis, MD  Brief Narrative: 57 y.o. female with medical history significant  insulin-dependent diabetes mellitus with hx of hypertension, PAF on Eliquis, CKD stage IV, iron deficiency anemia, chronic pain, duodenal carcinoid tumor resected in 2021. Patient also has interim history of admission 05/28/22 -06/04/22 with diagnosis of acute metabolic encephalopathy due to dehydration with associated AKI on CKDIV, with course complicated by disseminated gout. S/p treatment patient was discharged on colchine, Uloric  and allopurinol as well as steroid taper. Patient s/p  hospitalization was discharge to SNF. Patient now presents s/p d/c from snf with complaint of worsening sob and swelling over the last 24 hours.  Workup has revealed multiple opacities on chest x-ray, COVID-19 is negative and respiratory viral panel is positive for metapneumovirus infection.  Assessment & Plan:   Principal Problem:   HCAP (healthcare-associated pneumonia)   Metapneumovirus multifocal pneumonia/ Pneumonia in the setting of recent hospital admission - Imaging studies showed multifocal opacities.  COVID-19 PCR was negative.  She is afebrile.  WBC was noted to be elevated.   Patient was started on broad-spectrum antibiotics and remains on vancomycin and cefepime.  Respiratory viral panel positive for metapneumovirus Procalcitonin 0.33.  Vancomycin discontinued.  Blood cultures negative so far.  Change from cefepime to doxycycline.   Respiratory status is stable.   AKI on CKD4  Lower extremity edema HFpEF Patient had recently been admitted with disseminated gout with encephalopathy and AKI She was admitted with creatinine of 4.8, up from discharge creatinine of 2.2 however has gone up as high as 4.8 during the course of her last admission. Nephrology has been following closely who note that her UA is unremarkable  and CT abdomen without obstruction. She is on Lasix 120 mg 3 times daily  Bicarbonate on hold per nephrology.   Chronic pain Left-sided abdominal pain Lumbar stenosis States she has had chronic pain since she had GI cancer Abdominal exam is benign although complains of pain which is not present with distraction CT renal study was negative for abdominal abnormalities, LFTs WNL Change IV Dilaudid to Dilaudid 4 mg p.o. every 6 hours as needed Patient can be discharged home on her usual home medications of Dilaudid 2 mg p.o. every 8 hours as needed   Hyperuricemia/gout Recent admission for disseminated hyperuricemia and encephalopathy Uric acid now down to 8, was 16.5 at previous admission Continues on colchicine and allopurinol Patient completed her last dose of prednisone .   DM 2 Has history of HHS Continue present management on SSI Hemoglobin A1c is 8.1    Atrial fibrillation HTN BP and rate controlled on carvedilol Request for stroke prevention   Anemia of chronic disease-check hb in am Transfuse prn   Estimated body mass index is 55.37 kg/m as calculated from the following:   Height as of this encounter: 5\' 6"  (1.676 m).   Weight as of this encounter: 155.6 kg.  DVT prophylaxis: eliquis Code Status: full Family Communication: none Disposition Plan:  Status is: Inpatient Remains inpatient appropriate because: aki,hypoxia   Consultants:  renal  Procedures:none Antimicrobials: doxy  Subjective:  Adamant about wanting a regular diet Wants to have some mashed potato in the system  Says OUCH to anywhere I touch Objective: Vitals:   06/23/22 0448 06/23/22 0904 06/23/22 1332 06/23/22 1357  BP:  124/80  127/75  Pulse:  99  92  Resp:  20  20  Temp:  98  F (36.7 C)  98.3 F (36.8 C)  TempSrc:  Oral  Oral  SpO2: 94% 99% 97% 96%  Weight:      Height:        Intake/Output Summary (Last 24 hours) at 06/23/2022 1650 Last data filed at 06/23/2022 1340 Gross per  24 hour  Intake 890 ml  Output 2875 ml  Net -1985 ml   Filed Weights   06/21/22 0500 06/22/22 0500 06/23/22 0435  Weight: (!) 153.2 kg (!) 153.2 kg (!) 155.6 kg    Examination:  General exam: Appears in nad Respiratory system: diminished  to auscultation. Respiratory effort normal. Cardiovascular system: S1 & S2 heard, RRR. No JVD, murmurs, rubs, gallops or clicks. No pedal edema. Gastrointestinal system: Abdomen is nondistended, soft and nontender. No organomegaly or masses felt. Normal bowel sounds heard. Central nervous system: Alert and oriented. No focal neurological deficits. Extremities: 3 plus edema   Data Reviewed: I have personally reviewed following labs and imaging studies  CBC: Recent Labs  Lab 06/18/22 1817 06/19/22 0517 06/20/22 0541 06/21/22 0503  WBC 11.2* 12.6* 12.1* 10.6*  HGB 7.2* 6.9* 7.6* 8.2*  HCT 23.1* 22.5* 24.1* 26.1*  MCV 85.9 88.6 85.5 86.1  PLT 245 232 193 192   Basic Metabolic Panel: Recent Labs  Lab 06/19/22 0517 06/20/22 0541 06/21/22 0503 06/22/22 0509 06/23/22 0505  NA 134* 131* 128* 131* 134*  K 3.4* 3.3* 3.4* 3.5 3.6  CL 105 104 101 105 105  CO2 17* 14* 14* 13* 16*  GLUCOSE 164* 148* 213* 269* 279*  BUN 92* 89* 93* 102* 109*  CREATININE 4.82* 4.83* 4.99* 5.04* 5.13*  CALCIUM 8.1* 8.1* 8.2* 8.0* 8.2*   GFR: Estimated Creatinine Clearance: 18.5 mL/min (A) (by C-G formula based on SCr of 5.13 mg/dL (H)). Liver Function Tests: Recent Labs  Lab 06/18/22 2132 06/19/22 0517  AST 18 14*  ALT 31 28  ALKPHOS 92 77  BILITOT 0.4 0.8  PROT 7.0 6.5  ALBUMIN 2.9* 2.7*   Recent Labs  Lab 06/18/22 2132  LIPASE 88*   No results for input(s): "AMMONIA" in the last 168 hours. Coagulation Profile: No results for input(s): "INR", "PROTIME" in the last 168 hours. Cardiac Enzymes: No results for input(s): "CKTOTAL", "CKMB", "CKMBINDEX", "TROPONINI" in the last 168 hours. BNP (last 3 results) No results for input(s): "PROBNP" in  the last 8760 hours. HbA1C: No results for input(s): "HGBA1C" in the last 72 hours. CBG: Recent Labs  Lab 06/22/22 1212 06/22/22 1646 06/22/22 1947 06/23/22 0809 06/23/22 1224  GLUCAP 197* 407* 377* 199* 260*   Lipid Profile: No results for input(s): "CHOL", "HDL", "LDLCALC", "TRIG", "CHOLHDL", "LDLDIRECT" in the last 72 hours. Thyroid Function Tests: No results for input(s): "TSH", "T4TOTAL", "FREET4", "T3FREE", "THYROIDAB" in the last 72 hours. Anemia Panel: No results for input(s): "VITAMINB12", "FOLATE", "FERRITIN", "TIBC", "IRON", "RETICCTPCT" in the last 72 hours. Sepsis Labs: Recent Labs  Lab 06/18/22 2334 06/21/22 0503  PROCALCITON  --  0.33  LATICACIDVEN 1.6  --     Recent Results (from the past 240 hour(s))  SARS Coronavirus 2 by RT PCR (hospital order, performed in [facility] hospital lab) *cepheid single result test* Anterior Nasal Swab     Status: None   Collection Time: 06/18/22  9:16 PM   Specimen: Anterior Nasal Swab  Result Value Ref Range Status   SARS Coronavirus 2 by RT PCR NEGATIVE NEGATIVE Final    Comment: (NOTE) SARS-CoV-2 target nucleic acids are NOT DETECTED.  The SARS-CoV-2  RNA is generally detectable in upper and lower respiratory specimens during the acute phase of infection. The lowest concentration of SARS-CoV-2 viral copies this assay can detect is 250 copies / mL. A negative result does not preclude SARS-CoV-2 infection and should not be used as the sole basis for treatment or other patient management decisions.  A negative result may occur with improper specimen collection / handling, submission of specimen other than nasopharyngeal swab, presence of viral mutation(s) within the areas targeted by this assay, and inadequate number of viral copies (<250 copies / mL). A negative result must be combined with clinical observations, patient history, and epidemiological information.  Fact Sheet for Patients:    RoadLapTop.co.za  Fact Sheet for Healthcare Providers: http://kim-miller.com/  This test is not yet approved or  cleared by the [place] FDA and has been authorized for detection and/or diagnosis of SARS-CoV-2 by FDA under an Emergency Use Authorization (EUA).  This EUA will remain in effect (meaning this test can be used) for the duration of the COVID-19 declaration under Section 564(b)(1) of the Act, 21 U.S.C. section 360bbb-3(b)(1), unless the authorization is terminated or revoked sooner.  Performed at [facility], [address] [place], [place] [address]   Blood culture (routine x 2)     Status: None (Preliminary result)   Collection Time: 06/18/22 11:29 PM   Specimen: BLOOD  Result Value Ref Range Status   Specimen Description   Final    BLOOD BLOOD LEFT ARM Performed at [facility], [address], [place], [place] [address]    Special Requests   Final    BOTTLES DRAWN AEROBIC ONLY Blood Culture adequate volume Performed at [facility], [address] [place], [place] [address]    Culture   Final    NO GROWTH 4 DAYS Performed at [facility], [address] [place], [place] [address]    Report Status PENDING  Incomplete  Blood culture (routine x 2)     Status: None (Preliminary result)   Collection Time: 06/18/22 11:34 PM   Specimen: BLOOD  Result Value Ref Range Status   Specimen Description   Final    BLOOD BLOOD LEFT ARM Performed at [facility], [address] [place], [place] [address]    Special Requests   Final    BOTTLES DRAWN AEROBIC AND ANAEROBIC Blood Culture adequate volume Performed at [facility], [address], [place], [place] [address]    Culture   Final    NO GROWTH 4 DAYS Performed at [facility], [address], [place], [place] [address]    Report Status PENDING  Incomplete   Respiratory (~20 pathogens) panel by PCR     Status: Abnormal   Collection Time: 06/19/22  4:15 PM   Specimen: Nasopharyngeal Swab; Respiratory  Result Value Ref Range Status   Adenovirus NOT DETECTED NOT DETECTED Final   Coronavirus 229E NOT DETECTED NOT DETECTED Final    Comment: (NOTE) The Coronavirus on the Respiratory Panel, DOES NOT test for the novel  Coronavirus (2019 nCoV)    Coronavirus HKU1 NOT DETECTED NOT DETECTED Final   Coronavirus NL63 NOT DETECTED NOT DETECTED Final   Coronavirus OC43 NOT DETECTED NOT DETECTED Final   Metapneumovirus DETECTED (A) NOT DETECTED Final   Rhinovirus / Enterovirus NOT DETECTED NOT DETECTED Final   Influenza A NOT DETECTED NOT DETECTED Final   Influenza B NOT DETECTED NOT DETECTED Final  Parainfluenza Virus 1 NOT DETECTED NOT DETECTED Final   Parainfluenza Virus 2 NOT DETECTED NOT DETECTED Final   Parainfluenza Virus 3 NOT DETECTED NOT DETECTED Final   Parainfluenza Virus 4 NOT DETECTED NOT DETECTED Final   Respiratory Syncytial Virus NOT DETECTED NOT DETECTED Final   Bordetella pertussis NOT DETECTED NOT DETECTED Final   Bordetella Parapertussis NOT DETECTED NOT DETECTED Final   Chlamydophila pneumoniae NOT DETECTED NOT DETECTED Final   Mycoplasma pneumoniae NOT DETECTED NOT DETECTED Final    Comment: Performed at [facility], [address], [place], [place] [address]  MRSA Next Gen by PCR, Nasal     Status: None   Collection Time: 06/19/22  4:15 PM   Specimen: Nasal Mucosa; Nasal Swab  Result Value Ref Range Status   MRSA by PCR Next Gen NOT DETECTED NOT DETECTED Final    Comment: (NOTE) The GeneXpert MRSA Assay (FDA approved for NASAL specimens only), is one component of a comprehensive MRSA colonization surveillance program. It is not intended to diagnose MRSA infection nor to guide or monitor treatment for MRSA infections. Test performance is not FDA approved in patients less than 12 years old. Performed at [facility], [address] [place], [place] [address]          Radiology Studies: No results found.      Scheduled Meds:  allopurinol  50 mg Oral Once per day on Sun Wed   apixaban  5 mg Oral BID   atorvastatin  10 mg Oral Daily   carvedilol  12.5 mg Oral BID WC   Chlorhexidine Gluconate Cloth  6 each Topical Daily   donepezil  5 mg Oral QHS   doxycycline  100 mg Oral Q12H   febuxostat  40 mg Oral Daily   ferrous sulfate  325 mg Oral BID WC   folic acid  1 mg Oral Daily   guaiFENesin  600 mg Oral BID   insulin aspart  0-6 Units Subcutaneous TID WC   mirtazapine  30 mg Oral QHS   pantoprazole  40 mg Oral BID   Continuous Infusions:  furosemide 120 mg (06/23/22 1340)     LOS: 4 days    Time spent: 39 min  Alwyn Ren, MD  06/23/2022, 4:50 PM

## 2022-06-23 NOTE — Progress Notes (Signed)
Admit: 06/18/2022 LOS: 4  13F AoCKD4, multifocal pneumonia likley 2/2 metapneumovirus, recent admission for disseminated gout with encephalopathy and AKI, just discharged from SNF.   Subjective: Creat stable in the low 5s and BUN stable low 100s. UOP better w/ foley in and higher dose IV lasix 120 tid. 1.7 L yest and 1.9 L so far today. No confusion or N/V or jerking.    Physical Exam:  Blood pressure 127/75, pulse 92, temperature 98.3 F (36.8 C), temperature source Oral, resp. rate 20, height  (1.676 m), weight (!) 155.6 kg, last menstrual period 10/10/2012, SpO2 96 %. GEN: Chronically ill-appearing, morbidly obese female, up at side of bed ENT: NCAT EYES: EOMI CV: Regular, tachycardic, no rub, normal S1 and S2 PULM: Diminished in the bases, distant throughout ABD: benign, nontender EXT: 2+ diffuse LE edema throughout bilat LE's    Date   Creat  eGFR  2018-2020  1.01- 1.39   2021   1.31- 2.70  2022   1.64- 4.38  2023   1.72- 6.25 Mar 2022  3.02- 5.93 8- 17 ml/min  March 2024  1.92- 4.59 10-30 ml/min  4/12   4.84  10 ml/min    4/13   4.82  4/14   4.83  06/20/21  4.99  9 ml/min     B/l creat 2.1- 3.1 from march 2023, eGFR 17- 28 ml/ min   UA 11-20 wbc, 5-10 rbc from 06/19/22, prot 30   CT abd renal --> normal appearing kidneys w/o obstruction    Assessment/ Plan AoCKD4: b/l creat 2.1- 3.1 from march 2023, eGFR 17-28 ml/min. Creat here 4.8 on admission in setting of MPV resp infection and lower ext edema. Followed by Dr Thedore Mins at [facility]. UA unremarkable, CT abd w/o obstruction. Lasix started at 40 mg bid IV. We were not able to capture all UOP so foley was placed. Diuresing better now on higher dose IV lasix  tid. BUN 100s and creat low 5s. No indication for RRT yet. Will follow.  Metapneumovirus - w/ multifocal pneumonia R > L, sp IV vanc + cefepime, BCx NGTD. Is now on po doxy. Improving.  Lower extremity edema - bilat diffuse LE"s. As above. Clear lungs on imaging.  Uses  torsemide as outpatient Hypertension - on amlodipine, hydralazine, carvedilol Gout - with severe hyperuricemia on prednisone colchicine and allopurinol Anemia -  worsening, transfuse per TRH History of HFpEF, normal LVEF DM2 with history of HHS Atrial fibrillation - on rate control and apixaban Lumbar spinal stenosis. Chronic pain on narcotics   Vinson Moselle, MD 06/23/2022, 3:46 PM  Recent Labs  Lab 06/18/22 2132 06/19/22 0240 06/19/22 0517 06/20/22 0541 06/21/22 0503 06/22/22 0509 06/23/22 0505  HGB  --   --  6.9* 7.6* 8.2*  --   --   ALBUMIN 2.9*  --  2.7*  --   --   --   --   CALCIUM  --    < > 8.1* 8.1* 8.2* 8.0* 8.2*  CREATININE  --    < > 4.82* 4.83* 4.99* 5.04* 5.13*  K  --    < > 3.4* 3.3* 3.4* 3.5 3.6   < > = values in this interval not displayed.     Inpatient medications:  allopurinol  50 mg Oral Once per day on Sun Wed   apixaban  5 mg Oral BID   atorvastatin  10 mg Oral Daily   carvedilol  12.5 mg Oral BID WC   Chlorhexidine Gluconate Cloth  6 each Topical Daily   donepezil  5 mg Oral QHS   doxycycline  100 mg Oral Q12H   febuxostat  40 mg Oral Daily   ferrous sulfate  325 mg Oral BID WC   folic acid  1 mg Oral Daily   guaiFENesin  600 mg Oral BID   insulin aspart  0-6 Units Subcutaneous TID WC   mirtazapine  30 mg Oral QHS   pantoprazole  40 mg Oral BID    furosemide 120 mg (06/23/22 1340)   albuterol, guaiFENesin, HYDROmorphone, loperamide, ondansetron **OR** ondansetron (ZOFRAN) IV

## 2022-06-23 NOTE — Inpatient Diabetes Management (Signed)
Inpatient Diabetes Program Recommendations  AACE/ADA: New Consensus Statement on Inpatient Glycemic Control (2015)  Target Ranges:  Prepandial:   less than 140 mg/dL      Peak postprandial:   less than 180 mg/dL (1-2 hours)      Critically ill patients:  140 - 180 mg/dL   Lab Results  Component Value Date   GLUCAP 199 (H) 06/23/2022   HGBA1C 8.1 (H) 06/19/2022    Review of Glycemic Control  Latest Reference Range & Units 06/22/22 12:12 06/22/22 16:46 06/22/22 19:47 06/23/22 08:09  Glucose-Capillary 70 - 99 mg/dL 638 (H) 466 (H) 599 (H) 199 (H)   Diabetes history: DM2 Outpatient Diabetes medications: Semglee 20 BID, Humalog 2- 14 units TID Current orders for Inpatient glycemic control:  Novolog 0-6 TID  HgbA1C - 8.1% One dose PO Prednisone 20 mg given yesterday  Inpatient Diabetes Program Recommendations:    -  Consider adding Semglee 8 units -  Consider Novolog 2 units tid meal coverage if eating >50% of meals  Will continue to follow.  Thanks,  Christena Deem RN, MSN, BC-ADM Inpatient Diabetes Coordinator Team Pager [phone] (8a-5p)

## 2022-06-24 ENCOUNTER — Inpatient Hospital Stay (HOSPITAL_COMMUNITY): Payer: 59

## 2022-06-24 ENCOUNTER — Encounter (HOSPITAL_COMMUNITY): Payer: Self-pay | Admitting: Internal Medicine

## 2022-06-24 DIAGNOSIS — J189 Pneumonia, unspecified organism: Secondary | ICD-10-CM | POA: Diagnosis not present

## 2022-06-24 HISTORY — PX: IR FLUORO GUIDE CV LINE RIGHT: IMG2283

## 2022-06-24 HISTORY — PX: IR US GUIDE VASC ACCESS RIGHT: IMG2390

## 2022-06-24 LAB — CULTURE, BLOOD (ROUTINE X 2)
Culture: NO GROWTH
Culture: NO GROWTH
Special Requests: ADEQUATE

## 2022-06-24 LAB — CBC
HCT: 24.7 % — ABNORMAL LOW (ref 36.0–46.0)
Hemoglobin: 7.7 g/dL — ABNORMAL LOW (ref 12.0–15.0)
MCH: 26.8 pg (ref 26.0–34.0)
MCHC: 31.2 g/dL (ref 30.0–36.0)
MCV: 86.1 fL (ref 80.0–100.0)
Platelets: 196 10*3/uL (ref 150–400)
RBC: 2.87 MIL/uL — ABNORMAL LOW (ref 3.87–5.11)
RDW: 20.3 % — ABNORMAL HIGH (ref 11.5–15.5)
WBC: 12.2 10*3/uL — ABNORMAL HIGH (ref 4.0–10.5)
nRBC: 0 % (ref 0.0–0.2)

## 2022-06-24 LAB — BASIC METABOLIC PANEL
Anion gap: 13 (ref 5–15)
BUN: 110 mg/dL — ABNORMAL HIGH (ref 6–20)
CO2: 16 mmol/L — ABNORMAL LOW (ref 22–32)
Calcium: 8.3 mg/dL — ABNORMAL LOW (ref 8.9–10.3)
Chloride: 107 mmol/L (ref 98–111)
Creatinine, Ser: 5.21 mg/dL — ABNORMAL HIGH (ref 0.44–1.00)
GFR, Estimated: 9 mL/min — ABNORMAL LOW (ref >60)
Glucose, Bld: 176 mg/dL — ABNORMAL HIGH (ref 70–99)
Potassium: 3.1 mmol/L — ABNORMAL LOW (ref 3.5–5.1)
Sodium: 136 mmol/L (ref 135–145)

## 2022-06-24 LAB — HEPATITIS B SURFACE ANTIGEN: Hepatitis B Surface Ag: NONREACTIVE

## 2022-06-24 LAB — GLUCOSE, CAPILLARY
Glucose-Capillary: 148 mg/dL — ABNORMAL HIGH (ref 70–99)
Glucose-Capillary: 174 mg/dL — ABNORMAL HIGH (ref 70–99)
Glucose-Capillary: 175 mg/dL — ABNORMAL HIGH (ref 70–99)
Glucose-Capillary: 229 mg/dL — ABNORMAL HIGH (ref 70–99)

## 2022-06-24 MED ORDER — CHLORHEXIDINE GLUCONATE CLOTH 2 % EX PADS
6.0000 | MEDICATED_PAD | Freq: Every day | CUTANEOUS | Status: DC
  Administered 2022-06-24 – 2022-06-29 (×4): 6 via TOPICAL

## 2022-06-24 MED ORDER — LIDOCAINE-EPINEPHRINE 1 %-1:100000 IJ SOLN
INTRAMUSCULAR | Status: AC
  Filled 2022-06-24: qty 1

## 2022-06-24 MED ORDER — HEPARIN SODIUM (PORCINE) 1000 UNIT/ML IJ SOLN
INTRAMUSCULAR | Status: AC
  Filled 2022-06-24: qty 10

## 2022-06-24 MED ORDER — MIDAZOLAM HCL 2 MG/2ML IJ SOLN
INTRAMUSCULAR | Status: AC | PRN
  Administered 2022-06-24: .5 mg via INTRAVENOUS

## 2022-06-24 MED ORDER — VANCOMYCIN HCL IN DEXTROSE 1-5 GM/200ML-% IV SOLN
1000.0000 mg | Freq: Once | INTRAVENOUS | Status: AC
  Administered 2022-06-24: 1000 mg via INTRAVENOUS

## 2022-06-24 MED ORDER — POTASSIUM CHLORIDE CRYS ER 20 MEQ PO TBCR
40.0000 meq | EXTENDED_RELEASE_TABLET | Freq: Once | ORAL | Status: AC
  Administered 2022-06-24: 40 meq via ORAL
  Filled 2022-06-24: qty 2

## 2022-06-24 MED ORDER — VANCOMYCIN HCL IN DEXTROSE 1-5 GM/200ML-% IV SOLN
INTRAVENOUS | Status: AC
  Filled 2022-06-24: qty 200

## 2022-06-24 MED ORDER — MIDAZOLAM HCL 2 MG/2ML IJ SOLN
INTRAMUSCULAR | Status: AC
  Filled 2022-06-24: qty 2

## 2022-06-24 MED ORDER — GELATIN ABSORBABLE 12-7 MM EX MISC
CUTANEOUS | Status: AC
  Filled 2022-06-24: qty 1

## 2022-06-24 NOTE — Consult Note (Signed)
Chief Complaint: Patient was seen in consultation today for AKI on CKD4  Referring Physician(s): Dr. Arlean Hopping  Supervising Physician: Gilmer Mor  Patient Status: [facility] - Out-pt  History of Present Illness: Deborah [name] is a 57 y.o. female with past medical history significant for IDDM, HTN, PAF on Eliquis, CKD Stage 4, IDA, chronic pain, and duodenal carcinoid tumor resected in 2021 now admitted with acute on chronic worsening of her renal failure now in need of tunneled dialysis catheter placement.   Patient assessed at bedside. Just finished a container of lemonade.  Aware of plans to initiate dialysis.  She is agreeable to tunneled dialysis catheter placement.   Past Medical History:  Diagnosis Date   Acute back pain with sciatica, left    Acute back pain with sciatica, right    AKI (acute kidney injury)    Anemia, unspecified    Cancer    Carcinoid tumor of duodenum    Chest pain with normal coronary angiography 2019   Chronic kidney disease, stage 3b    Chronic pain    Chronic systolic CHF (congestive heart failure)    Diabetes mellitus    DKA (diabetic ketoacidosis)    Drug-seeking behavior    21 hospitalizations and 14 CT a/p in 2 years for N/V and abdominal pain, demanding only IV dilaudid   Elevated troponin    chronic   Esophageal reflux    Fibromyalgia    Gastric ulcer    Gastroparesis    Gout    Hyperlipidemia    Hypertension    Hypokalemia    Hypomagnesemia    Lumbosacral stenosis    LVH (left ventricular hypertrophy)    Morbid obesity    NICM (nonischemic cardiomyopathy)    PAF (paroxysmal atrial fibrillation)    Stroke 02/2011   Thrombocytosis    Vitamin B12 deficiency anemia     Past Surgical History:  Procedure Laterality Date   BIOPSY  07/27/2019   Procedure: BIOPSY;  Surgeon: Vida Rigger, MD;  Location: [facility] ENDOSCOPY;  Service: Endoscopy;;   BIOPSY  07/30/2019   Procedure: BIOPSY;  Surgeon: Kathi Der, MD;  Location: [facility]  ENDOSCOPY;  Service: Gastroenterology;;   CATARACT EXTRACTION  01/2014   CHOLECYSTECTOMY     COLONOSCOPY WITH PROPOFOL N/A 07/30/2019   Procedure: COLONOSCOPY WITH PROPOFOL;  Surgeon: Kathi Der, MD;  Location: [facility] ENDOSCOPY;  Service: Gastroenterology;  Laterality: N/A;   ESOPHAGOGASTRODUODENOSCOPY N/A 07/27/2019   Procedure: ESOPHAGOGASTRODUODENOSCOPY (EGD);  Surgeon: Vida Rigger, MD;  Location: [facility];  Service: Endoscopy;  Laterality: N/A;   ESOPHAGOGASTRODUODENOSCOPY N/A 07/26/2020   Procedure: ESOPHAGOGASTRODUODENOSCOPY (EGD);  Surgeon: Willis Modena, MD;  Location: [facility];  Service: Endoscopy;  Laterality: N/A;   ESOPHAGOGASTRODUODENOSCOPY (EGD) WITH PROPOFOL N/A 08/02/2019   Procedure: ESOPHAGOGASTRODUODENOSCOPY (EGD) WITH PROPOFOL;  Surgeon: Kathi Der, MD;  Location: [facility] ENDOSCOPY;  Service: Gastroenterology;  Laterality: N/A;   HEMOSTASIS CLIP PLACEMENT  08/02/2019   Procedure: HEMOSTASIS CLIP PLACEMENT;  Surgeon: Kathi Der, MD;  Location: [facility];  Service: Gastroenterology;;   POLYPECTOMY  07/30/2019   Procedure: POLYPECTOMY;  Surgeon: Kathi Der, MD;  Location: [facility];  Service: Gastroenterology;;   POLYPECTOMY  08/02/2019   Procedure: POLYPECTOMY;  Surgeon: Kathi Der, MD;  Location: [facility];  Service: Gastroenterology;;    Allergies: Diazepam, Gabapentin, Iodinated contrast media, Isovue [iopamidol], Lisinopril, Metoclopramide, Nsaids, Penicillins, Tolmetin, Dicyclomine, Acetaminophen, Cyclobenzaprine, Oxycodone, Rifamycins, and Tramadol  Medications: Prior to Admission medications   Medication Sig Start Date End Date Taking? Authorizing Provider  albuterol (PROVENTIL) (2.5 MG/3ML) 0.083% nebulizer solution Take 3 mLs (2.5 mg total) by nebulization every 6 (six) hours as needed for wheezing or shortness of breath. 04/06/19  Yes Bing Neighbors, NP  allopurinol (ZYLOPRIM) 100 MG tablet Take 0.5 tablets (50 mg total)  by mouth daily. 06/04/22  Yes Leroy Sea, MD  amLODipine (NORVASC) 10 MG tablet Take 1 tablet (10 mg total) by mouth daily. 06/04/22  Yes Leroy Sea, MD  apixaban (ELIQUIS) 5 MG TABS tablet Take 5 mg by mouth 2 (two) times daily. 12/03/21  Yes [provider]  atorvastatin (LIPITOR) 10 MG tablet Take 10 mg by mouth at bedtime. 12/03/21  Yes [provider]  carvedilol (COREG) 12.5 MG tablet Take 1 tablet (12.5 mg total) by mouth 2 (two) times daily with a meal. 06/04/22  Yes Leroy Sea, MD  colchicine 0.6 MG tablet Take 0.5 tablets (0.3 mg total) by mouth daily. 06/04/22  Yes Leroy Sea, MD  donepezil (ARICEPT) 5 MG tablet Take 1 tablet (5 mg total) by mouth at bedtime. 06/04/22  Yes Leroy Sea, MD  famotidine (PEPCID) 20 MG tablet Take 1 tablet (20 mg total) by mouth daily. 03/22/22 07/20/22 Yes Paige, Lucas Mallow, DO  ferrous sulfate 325 (65 FE) MG tablet Take 1 tablet (325 mg total) by mouth 2 (two) times daily with a meal. 05/27/22  Yes Kathlen Mody, MD  fluticasone (FLONASE) 50 MCG/ACT nasal spray Place 2 sprays into both nostrils daily as needed for allergies or rhinitis. Patient taking differently: Place 1 spray into both nostrils daily as needed for allergies. 12/19/18  Yes Rai, Ripudeep K, MD  folic acid (FOLVITE) 1 MG tablet Take 1 mg by mouth daily. 04/07/21  Yes [provider]  hydrALAZINE (APRESOLINE) 50 MG tablet Take 1 tablet (50 mg total) by mouth every 8 (eight) hours. Patient taking differently: Take 50 mg by mouth 3 (three) times daily. 06/04/22  Yes Leroy Sea, MD  HYDROmorphone (DILAUDID) 2 MG tablet Take 2 mg by mouth every 8 (eight) hours as needed for severe pain.   Yes [provider]  insulin aspart (NOVOLOG) 100 UNIT/ML injection Inject 5 Units into the skin 3 (three) times daily with meals. 06/04/22  Yes Leroy Sea, MD  insulin glargine-yfgn (SEMGLEE) 100 UNIT/ML injection Inject 0.2 mLs (20 Units  total) into the skin 2 (two) times daily. 06/04/22  Yes Leroy Sea, MD  insulin lispro (HUMALOG) 100 UNIT/ML KwikPen Before each meal 3 times a day, 140-199 - 2 units, 200-250 - 6 units, 251-299 - 8 units,  300-349 - 12 units,  350 or above 14 units. Patient taking differently: Inject 2-14 Units into the skin See admin instructions. Before each meal and at bedtime. 140-199 - 2 units, 200-250 - 6 units, 251-299 - 8 units,  300-349 - 12 units,  350 or above 14 units. 06/04/22  Yes Leroy Sea, MD  loperamide (IMODIUM A-D) 2 MG tablet Take 2 mg by mouth every 6 (six) hours as needed for diarrhea or loose stools.   Yes [provider]  mirtazapine (REMERON) 30 MG tablet Take 30 mg by mouth at bedtime.   Yes [provider]  Multiple Vitamin (MULTIVITAMIN WITH MINERALS) TABS tablet Take 1 tablet by mouth daily. 05/28/22  Yes Kathlen Mody, MD  omeprazole (PRILOSEC) 20 MG capsule Take 20 mg by mouth in the morning and at bedtime.   Yes [provider]  ondansetron (ZOFRAN ODT)  4 MG disintegrating tablet Take 1 tablet (4 mg total) by mouth every 8 (eight) hours as needed for nausea or vomiting. 05/30/21  Yes [name], MD  predniSONE (DELTASONE) 5 MG tablet take 8 Pills PO for 4 days, 6 Pills PO for 4 days, 4 Pills PO for 4 days, 2 Pills PO for 3 days, 1 Pills PO for 3 days, 1/2 Pill  PO for 3 days then STOP. 06/04/22  Yes Leroy Sea, MD  SENNA PLUS 8.6-50 MG tablet Take 1 tablet by mouth daily. 05/19/21  Yes [provider]  sodium bicarbonate 650 MG tablet Take 1 tablet (650 mg total) by mouth 2 (two) times daily. 04/20/21  Yes Lurene Shadow, MD  torsemide (DEMADEX) 20 MG tablet Take 1 tablet (20 mg total) by mouth daily. 06/04/22  Yes Leroy Sea, MD  Vitamin D, Ergocalciferol, (DRISDOL) 1.25 MG (50000 UNIT) CAPS capsule Take 50,000 Units by mouth once a week. 04/07/21  Yes [provider]  cetirizine (ZYRTEC) 10 MG tablet TAKE 1 TABLET  (10 MG TOTAL) BY MOUTH DAILY (AM) 06/23/22   Raulkar, Drema Pry, MD  diclofenac Sodium (VOLTAREN) 1 % GEL Apply 4 g topically 4 (four) times daily. Patient not taking: Reported on 06/19/2022 05/27/22   Kathlen Mody, MD  EASY COMFORT PEN NEEDLES 31G X 5 MM MISC USE 3 TIMES A DAY FOR INSULIN ADMINISTRATION 11/14/19   Meccariello, Solmon Ice, MD  febuxostat (ULORIC) 40 MG tablet Take 1 tablet (40 mg total) by mouth daily. Patient not taking: Reported on 06/19/2022 06/04/22   Leroy Sea, MD  mirtazapine (REMERON SOL-TAB) 15 MG disintegrating tablet Take 1 tablet (15 mg total) by mouth at bedtime. Patient not taking: Reported on 06/19/2022 06/04/22   Leroy Sea, MD  nystatin powder Apply 1 Application topically daily. Apply to under both breast topically. Patient not taking: Reported on 06/19/2022    [provider]  pantoprazole (PROTONIX) 40 MG tablet TAKE 1 TABLET BY MOUTH 2 (TWO) TIMES DAILY. (AM+BEDTIME) Patient not taking: Reported on 06/19/2022 03/11/22   Raulkar, Drema Pry, MD     Family History  Problem Relation Age of Onset   Diabetes Mother    Diabetes Father    Heart disease Father    Diabetes Sister    Congestive Heart Failure Sister 34   Diabetes Brother     Social History   Socioeconomic History   Marital status: Married    Spouse name: Not on file   Number of children: Not on file   Years of education: Not on file   Highest education level: Not on file  Occupational History   Not on file  Tobacco Use   Smoking status: Never   Smokeless tobacco: Never  Vaping Use   Vaping Use: Never used  Substance and Sexual Activity   Alcohol use: No   Drug use: No   Sexual activity: Not Currently    Birth control/protection: None  Other Topics Concern   Not on file  Social History Narrative   ** Merged History Encounter **       Social Determinants of Health   Financial Resource Strain: Not on file  Food Insecurity: No Food Insecurity (06/21/2022)   Hunger  Vital Sign    Worried About Running Out of Food in the Last Year: Never true    Ran Out of Food in the Last Year: Never true  Recent Concern: Food Insecurity - Food Insecurity Present (05/29/2022)   Hunger  Vital Sign    Worried About Programme researcher, broadcasting/film/video in the Last Year: Often true    Ran Out of Food in the Last Year: Often true  Transportation Needs: No Transportation Needs (06/21/2022)   PRAPARE - Administrator, Civil Service (Medical): No    Lack of Transportation (Non-Medical): No  Recent Concern: Transportation Needs - Unmet Transportation Needs (06/19/2022)   PRAPARE - Administrator, Civil Service (Medical): Yes    Lack of Transportation (Non-Medical): Yes  Physical Activity: Not on file  Stress: Not on file  Social Connections: Not on file     Review of Systems: A 12 point ROS discussed and pertinent positives are indicated in the HPI above.  All other systems are negative.  Review of Systems  Constitutional:  Negative for fatigue and fever.  Respiratory:  Positive for shortness of breath. Negative for cough.   Cardiovascular:  Negative for chest pain.  Gastrointestinal:  Negative for abdominal pain.  Musculoskeletal:  Negative for back pain.  Psychiatric/Behavioral:  Negative for behavioral problems and confusion.     Vital Signs: BP 129/77 (BP Location: Right Arm)   Pulse 98   Temp 98.1 F (36.7 C) (Oral)   Resp 19   Ht 5\' 6"  (1.676 m)   Wt (!) 331 lb 5.6 oz (150.3 kg)   LMP 10/10/2012   SpO2 100%   BMI 53.48 kg/m   Physical Exam Vitals and nursing note reviewed.  Constitutional:      General: She is not in acute distress.    Appearance: She is well-developed. She is not ill-appearing.  Cardiovascular:     Rate and Rhythm: Normal rate and regular rhythm.  Pulmonary:     Effort: Pulmonary effort is normal.     Breath sounds: Normal breath sounds.  Musculoskeletal:     Cervical back: Normal range of motion.  Skin:    General: Skin  is warm and dry.  Neurological:     General: No focal deficit present.     Mental Status: She is alert and oriented to person, place, and time.  Psychiatric:        Mood and Affect: Mood normal.        Behavior: Behavior normal.      MD Evaluation Airway: WNL Heart: WNL Abdomen: WNL ASA  Classification: 3   Imaging: CT CHEST WO CONTRAST  Result Date: 06/19/2022 CLINICAL DATA:  Abnormal x-ray, lung opacities. [name] and cough. EXAM: CT CHEST WITHOUT CONTRAST TECHNIQUE: Multidetector CT imaging of the chest was performed following the standard protocol without IV contrast. RADIATION DOSE REDUCTION: This exam was performed according to the departmental dose-optimization program which includes automated exposure control, adjustment of the mA and/or kV according to patient size and/or use of iterative reconstruction technique. COMPARISON:  05/10/2022. FINDINGS: Cardiovascular: The heart is normal in size and there is no pericardial effusion. A few scattered coronary artery calcifications are noted. The aorta is normal in caliber. The pulmonary trunk is distended suggesting underlying pulmonary artery hypertension. Mediastinum/Nodes: No mediastinal or axillary lymphadenopathy. Evaluation of the hila is limited due to lack of IV contrast. The thyroid gland, trachea, and esophagus are within normal limits. There is a small hiatal hernia. Lungs/Pleura: Patchy infiltrates are noted in the right lower lobe and there is consolidation in the right upper lobe. A few opacities are noted in the left upper lobe. No effusion or pneumothorax. Upper Abdomen: The gallbladder is surgically absent. No acute  abnormality. Musculoskeletal: Degenerative changes are present in the thoracic spine. No acute osseous abnormality. IMPRESSION: 1. Findings suggestive of multifocal pneumonia, most pronounced in the right lung. 2. Coronary artery calcifications. 3. Aortic atherosclerosis. Electronically Signed   By:  Thornell Sartorius M.D.   On: 06/19/2022 04:30   CT Renal Stone Study  Result Date: 06/18/2022 CLINICAL DATA:  Diffuse abdominal pain, difficulty breathing. EXAM: CT ABDOMEN AND PELVIS WITHOUT CONTRAST TECHNIQUE: Multidetector CT imaging of the abdomen and pelvis was performed following the standard protocol without IV contrast. RADIATION DOSE REDUCTION: This exam was performed according to the departmental dose-optimization program which includes automated exposure control, adjustment of the mA and/or kV according to patient size and/or use of iterative reconstruction technique. COMPARISON:  06/02/2022. FINDINGS: Lower chest: A few coronary artery calcifications are noted. Patchy infiltrates are noted in the right lower lobe, concerning for pneumonia. Hepatobiliary: No focal liver abnormality is seen. Status post cholecystectomy. No biliary dilatation. Pancreas: Unremarkable. No pancreatic ductal dilatation or surrounding inflammatory changes. Spleen: Normal in size without focal abnormality. Adrenals/Urinary Tract: The adrenal glands are within normal limits. No renal calculus or hydronephrosis bilaterally. The bladder is unremarkable. Stomach/Bowel: There is a small hiatal hernia. Stomach is within normal limits. Appendix is not definitely seen. No evidence of bowel wall thickening, distention, or inflammatory changes. No free air or pneumatosis. A few scattered diverticula are present along the colon without evidence of diverticulitis. Vascular/Lymphatic: Aortic atherosclerosis. A nonspecific prominent lymph node is present in the retroperitoneum on the left at the level of the left kidney measuring 1 cm. Reproductive: There is a stable pedunculated fibroid extending from the uterus on the left. No adnexal mass. Other: No abdominopelvic ascites. Musculoskeletal: Degenerative changes are present in the thoracolumbar spine. No acute osseous abnormality. IMPRESSION: 1. Patchy infiltrates in the right lower lobe,  suspicious for pneumonia. 2. No renal calculus or hydronephrosis. 3. Diverticulosis without diverticulitis. 4. Aortic atherosclerosis. Electronically Signed   By: Thornell Sartorius M.D.   On: 06/18/2022 22:59   DG Chest 2 View  Result Date: 06/18/2022 CLINICAL DATA:  [name] EXAM: CHEST - 2 VIEW COMPARISON:  Chest x-ray dated May 29, 2022 FINDINGS: The heart size and mediastinal contours are within normal limits. New consolidations of the upper and right lower lung. Lateral view is somewhat limited due to arm position. No evidence of pleural effusion or pneumothorax. Advanced degenerative changes of the right shoulder. IMPRESSION: New consolidations of the upper and right lower lung, concerning for infection. Recommend follow-up PA and lateral chest radiograph 6-8 weeks to ensure resolution. Electronically Signed   By: Allegra Lai M.D.   On: 06/18/2022 18:23   CT ABDOMEN PELVIS WO CONTRAST  Result Date: 06/02/2022 CLINICAL DATA:  Abdominal pain.  Stage 4 kidney disease. EXAM: CT ABDOMEN AND PELVIS WITHOUT CONTRAST TECHNIQUE: Multidetector CT imaging of the abdomen and pelvis was performed following the standard protocol without IV contrast. RADIATION DOSE REDUCTION: This exam was performed according to the departmental dose-optimization program which includes automated exposure control, adjustment of the mA and/or kV according to patient size and/or use of iterative reconstruction technique. COMPARISON:  CT, 05/10/2022. FINDINGS: Lower chest: Clear lung bases. Hepatobiliary: Liver normal in size. No liver mass. Status post cholecystectomy. No bile duct dilation. Pancreas: Unremarkable. No pancreatic ductal dilatation or surrounding inflammatory changes. Spleen: Normal in size without focal abnormality. Adrenals/Urinary Tract: Adrenal glands are unremarkable. Kidneys are normal, without renal calculi, focal lesion, or hydronephrosis. Bladder is unremarkable. Stomach/Bowel: Minimal hiatal  hernia. Stomach otherwise unremarkable. Small bowel and colon are normal in caliber. No wall thickening. No inflammation. Normal appendix. Vascular/Lymphatic: Minimal aortic atherosclerotic calcification. No aneurysm. No enlarged lymph nodes. Reproductive: Pedunculated fibroid from the left uterine fundus, 4.6 cm, unchanged. Uterus otherwise unremarkable. No other adnexal abnormalities. Other: No abdominal wall hernia or abnormality. No abdominopelvic ascites. Musculoskeletal: No fracture or acute finding.  No bone lesion. IMPRESSION: 1. No acute findings within the abdomen or pelvis. No findings to account for the patient's abdominal pain. Electronically Signed   By: Amie Portland M.D.   On: 06/02/2022 11:33   DG Abd Portable 1V  Result Date: 06/02/2022 CLINICAL DATA:  Constipation EXAM: PORTABLE ABDOMEN - 1 VIEW COMPARISON:  03/17/2022 FINDINGS: Normal bowel gas pattern. No abnormal stool retention. Cholecystectomy clips. No concerning mass effect or calcification. IMPRESSION: Normal bowel gas pattern.  No abnormal stool retention. Electronically Signed   By: Tiburcio Pea M.D.   On: 06/02/2022 07:17   US RENAL  Result Date: 05/29/2022 CLINICAL DATA:  Acute kidney injury EXAM: RENAL / URINARY TRACT ULTRASOUND COMPLETE COMPARISON:  03/19/2022 renal ultrasound FINDINGS: Right Kidney: Renal measurements: 11.5 x 5.0 x 5.3 cm = volume: 159 mL. Echogenicity within normal limits. No mass or hydronephrosis visualized. Left Kidney: Renal measurements: 10.5 x 6.1 x 4.8 cm = volume: 161 mL. Echogenicity within normal limits. Nonshadowing echogenic foci are noted in the mid left kidney, but no mass or hydronephrosis visualized. Bladder: Appears normal for degree of bladder distention. Bladder jets were not visualized. Other: Evaluation is somewhat limited by body habitus and overlying bowel gas. IMPRESSION: No significant sonographic abnormality of the kidneys. Electronically Signed   By: Wiliam Ke M.D.   On:  05/29/2022 12:01   EEG adult  Result Date: 05/29/2022 Deborah Quest, MD     05/29/2022  2:06 PM Patient Name: Deborah [name] MRN: [id number] Epilepsy Attending: Charlsie [name] Referring Physician/Provider: Leroy Sea, MD Date: 05/29/2022 Duration: 22.35 mins Patient history: 57yo F with ams. EEG to evaluate for seizure Level of alertness: Awake AEDs during EEG study: None Technical aspects: This EEG study was done with scalp electrodes positioned according to the 10-20 International system of electrode placement. Electrical activity was reviewed with band pass filter of 1-70Hz , sensitivity of 7 uV/mm, display speed of 48mm/sec with a 60Hz  notched filter applied as appropriate. EEG data were recorded continuously and digitally stored.  Video monitoring was available and reviewed as appropriate. Description: EEG showed continuous generalized 3 to 6 Hz theta-delta slowing. Generalized periodic discharges with triphasic morphology at  1Hz  were also noted. Hyperventilation and photic stimulation were not performed.   ABNORMALITY - Periodic discharges with triphasic morphology, generalized ( GPDs) - Continuous slow, generalized IMPRESSION: This study is suggestive of moderate diffuse encephalopathy, nonspecific etiology but likely related to toxic-metabolic causes. No seizures or definite epileptiform discharges were seen throughout the recording. Deborah [name]   DG Chest Port 1 View  Result Date: 05/29/2022 CLINICAL DATA:  57 year old female with shortness of breath. EXAM: PORTABLE CHEST 1 VIEW COMPARISON:  CT Chest, Abdomen, and Pelvis 05/10/2022, and earlier. FINDINGS: Portable AP upright view at 0553 hours. Lung volumes and mediastinal contours are within normal limits. Visualized tracheal air column is within normal limits. Allowing for portable technique the lungs are clear. No pneumothorax or pleural effusion. Paucity bowel gas in the visible abdomen. No acute osseous abnormality  identified. IMPRESSION: Negative portable chest. Electronically Signed   By: Althea Grimmer.D.  On: 05/29/2022 06:16   CT HEAD WO CONTRAST  Result Date: 05/29/2022 CLINICAL DATA:  Altered mental status EXAM: CT HEAD WITHOUT CONTRAST TECHNIQUE: Contiguous axial images were obtained from the base of the skull through the vertex without intravenous contrast. RADIATION DOSE REDUCTION: This exam was performed according to the departmental dose-optimization program which includes automated exposure control, adjustment of the mA and/or kV according to patient size and/or use of iterative reconstruction technique. COMPARISON:  05/10/2022 FINDINGS: Brain: No evidence of acute infarction, hemorrhage, hydrocephalus, extra-axial collection or mass lesion/mass effect. Mild cortical atrophy suggested. Vascular: Mild intracranial arterial calcifications. Skull: Normal. Negative for fracture or focal lesion. Sinuses/Orbits: No acute finding. Other: Congenital nonunion of the posterior arch of C1. IMPRESSION: No acute intracranial abnormalities. No change since previous study. Electronically Signed   By: Burman Nieves M.D.   On: 05/29/2022 02:36    Labs:  CBC: Recent Labs    06/19/22 0517 06/20/22 0541 06/21/22 0503 06/24/22 0516  WBC 12.6* 12.1* 10.6* 12.2*  HGB 6.9* 7.6* 8.2* 7.7*  HCT 22.5* 24.1* 26.1* 24.7*  PLT 232 193 192 196    COAGS: No results for input(s): "INR", "APTT" in the last 8760 hours.  BMP: Recent Labs    06/21/22 0503 06/22/22 0509 06/23/22 0505 06/24/22 0516  NA 128* 131* 134* 136  K 3.4* 3.5 3.6 3.1*  CL 101 105 105 107  CO2 14* 13* 16* 16*  GLUCOSE 213* 269* 279* 176*  BUN 93* 102* 109* 110*  CALCIUM 8.2* 8.0* 8.2* 8.3*  CREATININE 4.99* 5.04* 5.13* 5.21*  GFRNONAA 9* 9* 9* 9*    LIVER FUNCTION TESTS: Recent Labs    06/03/22 0432 06/04/22 0652 06/18/22 2132 06/19/22 0517  BILITOT 0.7 0.4 0.4 0.8  AST 17 10* 18 14*  ALT 16 14 31 28   ALKPHOS 84 72 92 77  PROT  7.7 6.9 7.0 6.5  ALBUMIN 2.4* 2.4* 2.9* 2.7*    TUMOR MARKERS: No results for input(s): "AFPTM", "CEA", "CA199", "CHROMGRNA" in the last 8760 hours.  Assessment and Plan: Acute on chronic renal failure Patient with history of DM, HTN, stroke, CHF, chronic kidney disease admitted with shortness of breath.  Found to have worsening renal function in need of dialysis. IR consulted for tunneled dialysis catheter placement.  Patient afebrile. Does have a slightly elevated WBC at 12.2. Recently treated with steroids for gout flare. On doxycycline here for possible pneumonia.  Just finished cup of lemonade prior to visit.  Proceed with tunneled dialysis catheter placement.   Risks and benefits discussed with the patient including, but not limited to bleeding, infection, vascular injury, pneumothorax which may require chest tube placement, air embolism or even death  All of the patient's questions were answered, patient is agreeable to proceed. Consent signed and in chart.  Thank you for this interesting consult.  I greatly enjoyed meeting RAYNELL UPTON and look forward to participating in their care.  A copy of this report was sent to the requesting provider on this date.  Electronically Signed: Hoyt Koch, PA 06/24/2022, 3:23 PM   I spent a total of 40 Minutes    in face to face in clinical consultation, greater than 50% of which was counseling/coordinating care for acute on chronic kidney disease.

## 2022-06-24 NOTE — Progress Notes (Signed)
PROGRESS NOTE    Deborah [name]  OZH:[id number] DOB: Mar 15, 1965 DOA: 06/18/2022 PCP: Elsie Amis, MD  Brief Narrative: 57 y.o. female with medical history significant  insulin-dependent diabetes mellitus with hx of hypertension, PAF on Eliquis, CKD stage IV, iron deficiency anemia, chronic pain, duodenal carcinoid tumor resected in 2021. Patient also has interim history of admission 05/28/22 -06/04/22 with diagnosis of acute metabolic encephalopathy due to dehydration with associated AKI on CKDIV, with course complicated by disseminated gout. S/p treatment patient was discharged on colchine, Uloric  and allopurinol as well as steroid taper. Patient s/p  hospitalization was discharge to SNF. Patient now presents s/p d/c from snf with complaint of worsening sob and swelling over the last 24 hours.  Workup has revealed multiple opacities on chest x-ray, COVID-19 is negative and respiratory viral panel is positive for metapneumovirus infection.  Assessment & Plan:   Principal Problem:   HCAP (healthcare-associated pneumonia)   Metapneumovirus multifocal pneumonia/ Pneumonia in the setting of recent hospital admission - Imaging studies showed multifocal opacities.  COVID-19 PCR was negative.  She is afebrile.  WBC was noted to be elevated.   Patient was started on broad-spectrum antibiotics and remains on vancomycin and cefepime.  Respiratory viral panel positive for metapneumovirus Procalcitonin 0.33.  Vancomycin discontinued.  Blood cultures negative so far.  Change from cefepime to doxycycline.   Respiratory status is stable.   AKI on CKD4  Lower extremity edema HFpEF Patient had recently been admitted with disseminated gout with encephalopathy and AKI She was admitted with creatinine of 4.8, up from discharge creatinine of 2.2 however has gone up as high as 4.8 during the course of her last admission. Nephrology has been following closely  CT abdomen without  obstruction. She is on Lasix 120 mg 3 times daily.  In spite of being on high-dose Lasix she remains fluid overloaded. She is agreeable for dialysis will be transferred to [facility] for the same.  Send a chat message to vascular Dr Lenell Antu since she needs access.   Chronic pain Left-sided abdominal pain Lumbar stenosis CT renal study did not show any obvious abnormalities.  Diverticulosis was noted but no diverticulitis.  She does not have any significant stool burden.  Will continue to monitor for now.  LFTs were noted to be unremarkable.  Lipase level was noted to be mildly elevated but clinical significance is unclear.  Could have been elevated due to her renal dysfunction.  She is not experiencing pain in the epigastric area. Abdomen remains benign.  Does not have any significant discomfort.  Continue to monitor.  She is tolerating her diet (She demanded a regular diet hence she is on a regular diet)   Hyperuricemia/gout Recent admission for disseminated hyperuricemia and encephalopathy Uric acid now down to 8, was 16.5 at previous admission Continues on colchicine and allopurinol Patient completed her last dose of prednisone .   DM 2 Has history of HHS Continue present management on SSI Hemoglobin A1c is 8.1    Atrial fibrillation HTN BP and rate controlled on carvedilol Request for stroke prevention   Anemia of chronic disease-hb 7.7  May need transfusion (on eliquis)  Estimated body mass index is 53.48 kg/m as calculated from the following:   Height as of this encounter: 5\' 6"  (1.676 m).   Weight as of this encounter: 150.3 kg.  DVT prophylaxis: eliquis Code Status: full Family Communication: none Disposition Plan:  Status is: Inpatient Remains inpatient appropriate because: aki,hypoxia   Consultants:  renal  Procedures:none Antimicrobials: doxy  Subjective:  She is agreeable for HD HAS a lot of fluid  C/o sob Objective: Vitals:   06/24/22 0526 06/24/22 0700  06/24/22 0800 06/24/22 1010  BP: 134/73 (!) 142/76 135/72   Pulse: 97 (!) 112    Resp: 20 18    Temp: 99.6 F (37.6 C) 98.2 F (36.8 C) 98.6 F (37 C)   TempSrc: Oral Oral Oral   SpO2: 100% 100% 100% 98%  Weight:      Height:        Intake/Output Summary (Last 24 hours) at 06/24/2022 1043 Last data filed at 06/24/2022 0959 Gross per 24 hour  Intake 812 ml  Output 1325 ml  Net -513 ml    Filed Weights   06/22/22 0500 06/23/22 0435 06/24/22 0315  Weight: (!) 153.2 kg (!) 155.6 kg (!) 150.3 kg    Examination:  General exam: Appears in nad Respiratory system: diminished  to auscultation. Respiratory effort normal. Cardiovascular system: S1 & S2 heard, RRR. No JVD, murmurs, rubs, gallops or clicks. No pedal edema. Gastrointestinal system: Abdomen is nondistended, soft and nontender. No organomegaly or masses felt. Normal bowel sounds heard. Central nervous system: Alert and oriented. No focal neurological deficits. Extremities: 3 plus edema   Data Reviewed: I have personally reviewed following labs and imaging studies  CBC: Recent Labs  Lab 06/18/22 1817 06/19/22 0517 06/20/22 0541 06/21/22 0503 06/24/22 0516  WBC 11.2* 12.6* 12.1* 10.6* 12.2*  HGB 7.2* 6.9* 7.6* 8.2* 7.7*  HCT 23.1* 22.5* 24.1* 26.1* 24.7*  MCV 85.9 88.6 85.5 86.1 86.1  PLT 245 232 193 192 196    Basic Metabolic Panel: Recent Labs  Lab 06/20/22 0541 06/21/22 0503 06/22/22 0509 06/23/22 0505 06/24/22 0516  NA 131* 128* 131* 134* 136  K 3.3* 3.4* 3.5 3.6 3.1*  CL 104 101 105 105 107  CO2 14* 14* 13* 16* 16*  GLUCOSE 148* 213* 269* 279* 176*  BUN 89* 93* 102* 109* 110*  CREATININE 4.83* 4.99* 5.04* 5.13* 5.21*  CALCIUM 8.1* 8.2* 8.0* 8.2* 8.3*    GFR: Estimated Creatinine Clearance: 17.8 mL/min (A) (by C-G formula based on SCr of 5.21 mg/dL (H)). Liver Function Tests: Recent Labs  Lab 06/18/22 2132 06/19/22 0517  AST 18 14*  ALT 31 28  ALKPHOS 92 77  BILITOT 0.4 0.8  PROT 7.0  6.5  ALBUMIN 2.9* 2.7*    Recent Labs  Lab 06/18/22 2132  LIPASE 88*    No results for input(s): "AMMONIA" in the last 168 hours. Coagulation Profile: No results for input(s): "INR", "PROTIME" in the last 168 hours. Cardiac Enzymes: No results for input(s): "CKTOTAL", "CKMB", "CKMBINDEX", "TROPONINI" in the last 168 hours. BNP (last 3 results) No results for input(s): "PROBNP" in the last 8760 hours. HbA1C: No results for input(s): "HGBA1C" in the last 72 hours. CBG: Recent Labs  Lab 06/23/22 0809 06/23/22 1224 06/23/22 1802 06/23/22 2104 06/24/22 0746  GLUCAP 199* 260* 274* 303* 148*    Lipid Profile: No results for input(s): "CHOL", "HDL", "LDLCALC", "TRIG", "CHOLHDL", "LDLDIRECT" in the last 72 hours. Thyroid Function Tests: No results for input(s): "TSH", "T4TOTAL", "FREET4", "T3FREE", "THYROIDAB" in the last 72 hours. Anemia Panel: No results for input(s): "VITAMINB12", "FOLATE", "FERRITIN", "TIBC", "IRON", "RETICCTPCT" in the last 72 hours. Sepsis Labs: Recent Labs  Lab 06/18/22 2334 06/21/22 0503  PROCALCITON  --  0.33  LATICACIDVEN 1.6  --      Recent Results (from the past 240 hour(s))  SARS  Coronavirus 2 by RT PCR (hospital order, performed in [facility] hospital lab) *cepheid single result test* Anterior Nasal Swab     Status: None   Collection Time: 06/18/22  9:16 PM   Specimen: Anterior Nasal Swab  Result Value Ref Range Status   SARS Coronavirus 2 by RT PCR NEGATIVE NEGATIVE Final    Comment: (NOTE) SARS-CoV-2 target nucleic acids are NOT DETECTED.  The SARS-CoV-2 RNA is generally detectable in upper and lower respiratory specimens during the acute phase of infection. The lowest concentration of SARS-CoV-2 viral copies this assay can detect is 250 copies / mL. A negative result does not preclude SARS-CoV-2 infection and should not be used as the sole basis for treatment or other patient management decisions.  A negative result may occur  with improper specimen collection / handling, submission of specimen other than nasopharyngeal swab, presence of viral mutation(s) within the areas targeted by this assay, and inadequate number of viral copies (<250 copies / mL). A negative result must be combined with clinical observations, patient history, and epidemiological information.  Fact Sheet for Patients:   RoadLapTop.co.za  Fact Sheet for Healthcare Providers: http://kim-miller.com/  This test is not yet approved or  cleared by the [place] FDA and has been authorized for detection and/or diagnosis of SARS-CoV-2 by FDA under an Emergency Use Authorization (EUA).  This EUA will remain in effect (meaning this test can be used) for the duration of the COVID-19 declaration under Section 564(b)(1) of the Act, 21 U.S.C. section 360bbb-3(b)(1), unless the authorization is terminated or revoked sooner.  Performed at [facility], [address], [place], [place] [address]   Blood culture (routine x 2)     Status: None   Collection Time: 06/18/22 11:29 PM   Specimen: BLOOD  Result Value Ref Range Status   Specimen Description   Final    BLOOD BLOOD LEFT ARM Performed at [facility], [address], [place], [place] [address]    Special Requests   Final    BOTTLES DRAWN AEROBIC ONLY Blood Culture adequate volume Performed at [facility], [address], [place], [place] [address]    Culture   Final    NO GROWTH 5 DAYS Performed at [facility], [address] [place], [place] [address]    Report Status 06/24/2022 FINAL  Final  Blood culture (routine x 2)     Status: None   Collection Time: 06/18/22 11:34 PM   Specimen: BLOOD  Result Value Ref Range Status   Specimen Description   Final    BLOOD BLOOD LEFT ARM Performed at [facility], [address] [place], [place] [address]     Special Requests   Final    BOTTLES DRAWN AEROBIC AND ANAEROBIC Blood Culture adequate volume Performed at [facility], [address], [place], [place] [address]    Culture   Final    NO GROWTH 5 DAYS Performed at [facility], [address], [place], [place] [address]    Report Status 06/24/2022 FINAL  Final  Respiratory (~20 pathogens) panel by PCR     Status: Abnormal   Collection Time: 06/19/22  4:15 PM   Specimen: Nasopharyngeal Swab; Respiratory  Result Value Ref Range Status   Adenovirus NOT DETECTED NOT DETECTED Final   Coronavirus 229E NOT DETECTED NOT DETECTED Final    Comment: (NOTE) The Coronavirus on the Respiratory Panel, DOES NOT test for the  novel  Coronavirus (2019 nCoV)    Coronavirus HKU1 NOT DETECTED NOT DETECTED Final   Coronavirus NL63 NOT DETECTED NOT DETECTED Final   Coronavirus OC43 NOT DETECTED NOT DETECTED Final   Metapneumovirus DETECTED (A) NOT DETECTED Final   Rhinovirus / Enterovirus NOT DETECTED NOT DETECTED Final   Influenza A NOT DETECTED NOT DETECTED Final   Influenza B NOT DETECTED NOT DETECTED Final   Parainfluenza Virus 1 NOT DETECTED NOT DETECTED Final   Parainfluenza Virus 2 NOT DETECTED NOT DETECTED Final   Parainfluenza Virus 3 NOT DETECTED NOT DETECTED Final   Parainfluenza Virus 4 NOT DETECTED NOT DETECTED Final   Respiratory Syncytial Virus NOT DETECTED NOT DETECTED Final   Bordetella pertussis NOT DETECTED NOT DETECTED Final   Bordetella Parapertussis NOT DETECTED NOT DETECTED Final   Chlamydophila pneumoniae NOT DETECTED NOT DETECTED Final   Mycoplasma pneumoniae NOT DETECTED NOT DETECTED Final    Comment: Performed at [facility], [address], [place], [place] [address]  MRSA Next Gen by PCR, Nasal     Status: None   Collection Time: 06/19/22  4:15 PM   Specimen: Nasal Mucosa; Nasal Swab  Result Value Ref Range Status   MRSA by PCR Next Gen NOT DETECTED NOT DETECTED Final    Comment:  (NOTE) The GeneXpert MRSA Assay (FDA approved for NASAL specimens only), is one component of a comprehensive MRSA colonization surveillance program. It is not intended to diagnose MRSA infection nor to guide or monitor treatment for MRSA infections. Test performance is not FDA approved in patients less than 52 years old. Performed at [facility], [address] [place], [place] [address]          Radiology Studies: No results found.      Scheduled Meds:  allopurinol  50 mg Oral Once per day on Sun Wed   apixaban  5 mg Oral BID   atorvastatin  10 mg Oral Daily   carvedilol  12.5 mg Oral BID WC   Chlorhexidine Gluconate Cloth  6 each Topical Daily   Chlorhexidine Gluconate Cloth  6 each Topical Q0600   donepezil  5 mg Oral QHS   doxycycline  100 mg Oral Q12H   febuxostat  40 mg Oral Daily   ferrous sulfate  325 mg Oral BID WC   folic acid  1 mg Oral Daily   guaiFENesin  600 mg Oral BID   insulin aspart  0-6 Units Subcutaneous TID WC   insulin aspart  4 Units Subcutaneous TID WC   insulin glargine-yfgn  8 Units Subcutaneous QHS   mirtazapine  30 mg Oral QHS   pantoprazole  40 mg Oral BID   Continuous Infusions:  furosemide 120 mg (06/24/22 0521)     LOS: 5 days    Time spent: 39 min  Alwyn Ren, MD  06/24/2022, 10:43 AM

## 2022-06-24 NOTE — Procedures (Addendum)
Interventional Radiology Procedure Note  Procedure: Placement of a right IJ approach tunneled HD cath.  Tip is positioned at the superior cavoatrial junction and catheter is ready for immediate use.  Complications: None Recommendations:  - Ok to use - Do not submerge - Routine line care  - VIR ok with advancing diet, per primary order  Signed,  Yvone Neu. Loreta Ave, DO

## 2022-06-24 NOTE — Progress Notes (Signed)
Patient to transfer to [facility] for Dialysis. Report called to the receiving floor and Durwin Nora RN accepting report

## 2022-06-24 NOTE — Care Management Important Message (Signed)
Important Message  Patient Details IM Letter given. Name: Deborah [name] MRN: [id number] Date of Birth: 08-16-1965   Medicare Important Message Given:  Yes     Caren Macadam 06/24/2022, 10:59 AM

## 2022-06-24 NOTE — Progress Notes (Addendum)
Admit: 06/18/2022 LOS: 4  18F AoCKD4, multifocal pneumonia likley 2/2 metapneumovirus, recent admission for disseminated gout with encephalopathy and AKI, just discharged from SNF.   Subjective: Creat stable w/ slight increase as is BUN. Talked w/ pt's brother from [place] on the phone, he says her home QOL is horrible because of chronic vol overload, chronic SOB/and orthopnea, movement is limited because of SOB and gen weakness.    Physical Exam:  Blood pressure 135/72, pulse (!) 112, temperature 98.6 F (37 C), temperature source Oral, resp. rate 18, height 5\' 6"  (1.676 m), weight (!) 150.3 kg, last menstrual period 10/10/2012, SpO2 98 %. GEN: Chronically ill-appearing, morbidly obese female, up at side of bed ENT: NCAT EYES: EOMI CV: Regular, tachycardic, no rub, normal S1 and S2 PULM: Diminished in the bases, distant throughout ABD: benign, nontender EXT: 2+ diffuse LE edema throughout bilat LE's    Date   Creat  eGFR  2018-2020  1.01- 1.39   2021   1.31- 2.70  2022   1.64- 4.38  2023   1.72- 6.25 Mar 2022  3.02- 5.93 8- 17 ml/min  March 2024  1.92- 4.59 10-30 ml/min  4/12   4.84  10 ml/min    4/13   4.82  4/14   4.83  06/20/21  4.99  9 ml/min     B/l creat 2.1- 3.1 from march 2023, eGFR 17- 28 ml/ min   UA 11-20 wbc, 5-10 rbc from 06/19/22, prot 30   CT abd renal --> normal appearing kidneys w/o obstruction    Assessment/ Plan AoCKD4: b/l creat 2.1- 3.1 from march 2023, eGFR 17-28 ml/min. Creat here 4.8 on admission in setting of MPV resp infection and lower ext edema. Followed by Dr Thedore Mins at [facility]. UA unremarkable, CT abd w/o obstruction. Lasix started at 40 mg bid IV and now is up to 120mg  IV tid. UOP is 1500- 2000 per day, no sig change in edema and no improvement in weights compared to admit 6 days ago. Her brother (esrd on HD) says her QOL is "horrible" and has been for the last couple of years due to her chronic fluid overload issues. Have d/w the patient, not getting better  here despite high dose lasix, we should start her on dialysis and she is agreeable. Plan is for transfer to [facility], will consult IR for [facility] (can be done here or at [facility]) and plan to start HD this weekend after access is placed.  HD access - will need [facility] consult when at [facility]  Metapneumovirus - w/ multifocal pneumonia R > L, sp IV vanc + cefepime, BCx NGTD. Is now on po doxy. Improving.  Lower extremity edema - bilat diffuse LE"s. Will cont IV lasix while awaiting transfer/ line placement/ HD Hypertension - on amlodipine, hydralazine, carvedilol Gout - with severe hyperuricemia on prednisone colchicine and allopurinol Anemia -  worsening, transfuse per TR[facility] History of HFpEF, normal LVEF DM2 with history of HHS Atrial fibrillation - on rate control and apixaban Lumbar spinal stenosis. Chronic pain on narcotics   Vinson Moselle, MD 06/24/2022, 10:32 AM  Recent Labs  Lab 06/18/22 2132 06/19/22 0240 06/19/22 0517 06/20/22 0541 06/21/22 0503 06/22/22 0509 06/23/22 0505 06/24/22 0516  HGB  --   --  6.9*   < > 8.2*  --   --  7.7*  ALBUMIN 2.9*  --  2.7*  --   --   --   --   --   CALCIUM  --    < >  8.1*   < > 8.2*   < > 8.2* 8.3*  CREATININE  --    < > 4.82*   < > 4.99*   < > 5.13* 5.21*  K  --    < > 3.4*   < > 3.4*   < > 3.6 3.1*   < > = values in this interval not displayed.     Inpatient medications:  allopurinol  50 mg Oral Once per day on Sun Wed   apixaban  5 mg Oral BID   atorvastatin  10 mg Oral Daily   carvedilol  12.5 mg Oral BID WC   Chlorhexidine Gluconate Cloth  6 each Topical Daily   donepezil  5 mg Oral QHS   doxycycline  100 mg Oral Q12H   febuxostat  40 mg Oral Daily   ferrous sulfate  325 mg Oral BID WC   folic acid  1 mg Oral Daily   guaiFENesin  600 mg Oral BID   insulin aspart  0-6 Units Subcutaneous TID WC   insulin aspart  4 Units Subcutaneous TID WC   insulin glargine-yfgn  8 Units Subcutaneous QHS   mirtazapine  30 mg Oral QHS   pantoprazole  40 mg Oral  BID    furosemide 120 mg (06/24/22 0521)   albuterol, guaiFENesin, HYDROmorphone, loperamide, ondansetron **OR** ondansetron (ZOFRAN) IV

## 2022-06-24 NOTE — Consult Note (Signed)
[facility] [place]  ASSESSMENT / PLAN: Deborah [name] is a 57 y.o. right handed female in need of permanent dialysis access. I reviewed options for dialysis in detail with the patient, including hemodialysis and peritoneal dialysis. I counseled the patient to ask their nephrologist about their candidacy for renal transplant. I counseled the patient that dialysis access requires surveillance and periodic maintenance. Awaiting vein mapping. Remove left arm IV. Protect left upper extremity from further venipuncture.  CHIEF COMPLAINT: dyspnea  HISTORY OF PRESENT ILLNESS: Deborah [name] is a 57 y.o. female admitted to internal medicine service for treatment of pneumonia and acute kidney injury on CKD. The nephrology team has recommended she initiate dialysis to help manage her renal failure. She is right handed. She has never had dialysis access before.   Past Medical History:  Diagnosis Date   Acute back pain with sciatica, left    Acute back pain with sciatica, right    AKI (acute kidney injury)    Anemia, unspecified    Cancer    Carcinoid tumor of duodenum    Chest pain with normal coronary angiography 2019   Chronic kidney disease, stage 3b    Chronic pain    Chronic systolic CHF (congestive heart failure)    Diabetes mellitus    DKA (diabetic ketoacidosis)    Drug-seeking behavior    21 hospitalizations and 14 CT a/p in 2 years for N/V and abdominal pain, demanding only IV dilaudid   Elevated troponin    chronic   Esophageal reflux    Fibromyalgia    Gastric ulcer    Gastroparesis    Gout    Hyperlipidemia    Hypertension    Hypokalemia    Hypomagnesemia    Lumbosacral stenosis    LVH (left ventricular hypertrophy)    Morbid obesity    NICM (nonischemic cardiomyopathy)    PAF (paroxysmal atrial fibrillation)    Stroke 02/2011   Thrombocytosis    Vitamin B12 deficiency anemia     Past Surgical History:  Procedure Laterality Date    BIOPSY  07/27/2019   Procedure: BIOPSY;  Surgeon: Vida Rigger, MD;  Location: [facility];  Service: Endoscopy;;   BIOPSY  07/30/2019   Procedure: BIOPSY;  Surgeon: Kathi Der, MD;  Location: [facility];  Service: Gastroenterology;;   CATARACT EXTRACTION  01/2014   CHOLECYSTECTOMY     COLONOSCOPY WITH PROPOFOL N/A 07/30/2019   Procedure: COLONOSCOPY WITH PROPOFOL;  Surgeon: Kathi Der, MD;  Location: [facility];  Service: Gastroenterology;  Laterality: N/A;   ESOPHAGOGASTRODUODENOSCOPY N/A 07/27/2019   Procedure: ESOPHAGOGASTRODUODENOSCOPY (EGD);  Surgeon: Vida Rigger, MD;  Location: [facility];  Service: Endoscopy;  Laterality: N/A;   ESOPHAGOGASTRODUODENOSCOPY N/A 07/26/2020   Procedure: ESOPHAGOGASTRODUODENOSCOPY (EGD);  Surgeon: Willis Modena, MD;  Location: [facility];  Service: Endoscopy;  Laterality: N/A;   ESOPHAGOGASTRODUODENOSCOPY (EGD) WITH PROPOFOL N/A 08/02/2019   Procedure: ESOPHAGOGASTRODUODENOSCOPY (EGD) WITH PROPOFOL;  Surgeon: Kathi Der, MD;  Location: [facility];  Service: Gastroenterology;  Laterality: N/A;   HEMOSTASIS CLIP PLACEMENT  08/02/2019   Procedure: HEMOSTASIS CLIP PLACEMENT;  Surgeon: Kathi Der, MD;  Location: [facility];  Service: Gastroenterology;;   POLYPECTOMY  07/30/2019   Procedure: POLYPECTOMY;  Surgeon: Kathi Der, MD;  Location: [facility];  Service: Gastroenterology;;   POLYPECTOMY  08/02/2019   Procedure: POLYPECTOMY;  Surgeon: Kathi Der, MD;  Location: [facility];  Service: Gastroenterology;;    Family History  Problem Relation Age of Onset   Diabetes Mother  Diabetes Father    Heart disease Father    Diabetes Sister    Congestive Heart Failure Sister 83   Diabetes Brother     Social History   Socioeconomic History   Marital status: Married    Spouse name: Not on file   Number of children: Not on file   Years of education: Not on file   Highest education level: Not on file   Occupational History   Not on file  Tobacco Use   Smoking status: Never   Smokeless tobacco: Never  Vaping Use   Vaping Use: Never used  Substance and Sexual Activity   Alcohol use: No   Drug use: No   Sexual activity: Not Currently    Birth control/protection: None  Other Topics Concern   Not on file  Social History Narrative   ** Merged History Encounter **       Social Determinants of Health   Financial Resource Strain: Not on file  Food Insecurity: No Food Insecurity (06/21/2022)   Hunger Vital Sign    Worried About Running Out of Food in the Last Year: Never true    Ran Out of Food in the Last Year: Never true  Recent Concern: Food Insecurity - Food Insecurity Present (05/29/2022)   Hunger Vital Sign    Worried About Running Out of Food in the Last Year: Often true    Ran Out of Food in the Last Year: Often true  Transportation Needs: No Transportation Needs (06/21/2022)   PRAPARE - Administrator, Civil Service (Medical): No    Lack of Transportation (Non-Medical): No  Recent Concern: Transportation Needs - Unmet Transportation Needs (06/19/2022)   PRAPARE - Administrator, Civil Service (Medical): Yes    Lack of Transportation (Non-Medical): Yes  Physical Activity: Not on file  Stress: Not on file  Social Connections: Not on file  Intimate Partner Violence: Not At Risk (06/21/2022)   Humiliation, Afraid, Rape, and Kick questionnaire    Fear of Current or Ex-Partner: No    Emotionally Abused: No    Physically Abused: No    Sexually Abused: No    Allergies  Allergen Reactions   Diazepam [name]   Gabapentin [name] and Swelling    Other reaction(s): Unknown   Iodinated Contrast Media Anaphylaxis    11/29/17 Cardiac arrest 1 min after IV contrast, possible allergy vs vasovagal episode Iopamidol  Anaphylaxis  High 11/28/2017  Patient had seizure like activity and then code post 100 cc of isovue 300     Isovue  [Iopamidol] Anaphylaxis    11/28/17 Patient had seizure like activity and then 1 min code after 100 cc of isovue 300. Possible contrast allergy vs vasovagal episode   Lisinopril Anaphylaxis    Tongue and mouth swelling   Metoclopramide Other (See Comments)    Tardive dyskinesia  Also known as Reglan    Nsaids Anaphylaxis and Other (See Comments)    ULCER   Penicillins Palpitations    Has patient had a PCN reaction causing immediate rash, facial/tongue/throat swelling, SOB or lightheadedness with hypotension: Yes, heart races Has patient had a PCN reaction causing severe rash involving mucus membranes or skin necrosis: No Has patient had a PCN reaction that required hospitalization: Yes  Has patient had a PCN reaction occurring within the last 10 years: No    Tolmetin Nausea Only and Other (See Comments)    ULCER   Dicyclomine Other (See Comments)  Chest pain   Acetaminophen Nausea Only and Other (See Comments)    Irritates stomach ulcer; Abdominal pain   Cyclobenzaprine Palpitations   Oxycodone Palpitations   Rifamycins Palpitations   Tramadol Nausea And Vomiting    Current Facility-Administered Medications  Medication Dose Route Frequency Provider Last Rate Last Admin   albuterol (PROVENTIL) (2.5 MG/3ML) 0.083% nebulizer solution 2.5 mg  2.5 mg Nebulization Q6H PRN Osvaldo Shipper, MD   2.5 mg at 06/24/22 1009   allopurinol (ZYLOPRIM) tablet 50 mg  50 mg Oral Once per day on Sun Wed Krishnan, Georgiana Spinner, MD   50 mg at 06/23/22 1610   apixaban (ELIQUIS) tablet 5 mg  5 mg Oral BID Osvaldo Shipper, MD   5 mg at 06/24/22 1112   atorvastatin (LIPITOR) tablet 10 mg  10 mg Oral Daily Osvaldo Shipper, MD   10 mg at 06/24/22 1112   carvedilol (COREG) tablet 12.5 mg  12.5 mg Oral BID WC Osvaldo Shipper, MD   12.5 mg at 06/24/22 0825   Chlorhexidine Gluconate Cloth 2 % PADS 6 each  6 each Topical Daily Alwyn Ren, MD   6 each at 06/24/22 1159   Chlorhexidine Gluconate Cloth 2 % PADS 6  each  6 each Topical Q0600 Delano Metz, MD   6 each at 06/24/22 1200   donepezil (ARICEPT) tablet 5 mg  5 mg Oral QHS Osvaldo Shipper, MD   5 mg at 06/23/22 2133   doxycycline (VIBRA-TABS) tablet 100 mg  100 mg Oral Q12H Osvaldo Shipper, MD   100 mg at 06/24/22 1111   febuxostat (ULORIC) tablet 40 mg  40 mg Oral Daily Osvaldo Shipper, MD   40 mg at 06/24/22 1112   ferrous sulfate tablet 325 mg  325 mg Oral BID WC Osvaldo Shipper, MD   325 mg at 06/24/22 0825   folic acid (FOLVITE) tablet 1 mg  1 mg Oral Daily Osvaldo Shipper, MD   1 mg at 06/24/22 1112   furosemide (LASIX) 120 mg in dextrose 5 % 50 mL IVPB  120 mg Intravenous Q8H Delano Metz, MD 62 mL/hr at 06/24/22 0521 120 mg at 06/24/22 0521   guaiFENesin (MUCINEX) 12 hr tablet 600 mg  600 mg Oral BID Osvaldo Shipper, MD   600 mg at 06/24/22 1111   guaiFENesin (ROBITUSSIN) 100 MG/5ML liquid 5 mL  5 mL Oral Q4H PRN Osvaldo Shipper, MD   5 mL at 06/22/22 0616   HYDROmorphone (DILAUDID) tablet 4 mg  4 mg Oral Q6H PRN Leandro Reasoner Tublu, MD   4 mg at 06/24/22 0827   insulin aspart (novoLOG) injection 0-6 Units  0-6 Units Subcutaneous TID WC Leandro Reasoner Tublu, MD   1 Units at 06/24/22 1214   insulin aspart (novoLOG) injection 4 Units  4 Units Subcutaneous TID WC Alwyn Ren, MD   4 Units at 06/24/22 1215   insulin glargine-yfgn [facility]) injection 8 Units  8 Units Subcutaneous QHS Alwyn Ren, MD   8 Units at 06/23/22 2133   loperamide (IMODIUM) capsule 2 mg  2 mg Oral Q6H PRN Osvaldo Shipper, MD       mirtazapine (REMERON) tablet 30 mg  30 mg Oral QHS Osvaldo Shipper, MD   30 mg at 06/23/22 2133   ondansetron (ZOFRAN) tablet 4 mg  4 mg Oral Q6H PRN Lurline Del, MD       Or   ondansetron [facility]) injection 4 mg  4 mg Intravenous Q6H PRN Lurline Del, MD  pantoprazole (PROTONIX) EC tablet 40 mg  40 mg Oral BID Osvaldo Shipper, MD   40 mg at 06/24/22 1112   potassium chloride SA (KLOR-CON M)  CR tablet 40 mEq  40 mEq Oral Once Alwyn Ren, MD        PHYSICAL EXAM Vitals:   06/24/22 0700 06/24/22 0800 06/24/22 1010 06/24/22 1140  BP: (!) 142/76 135/72  129/77  Pulse: (!) 112   98  Resp: 18   19  Temp: 98.2 F (36.8 C) 98.6 F (37 C)  98.1 F (36.7 C)  TempSrc: Oral Oral  Oral  SpO2: 100% 100% 98% 100%  Weight:      Height:       Chronically ill appearing woman in no distress Regular rate and rhythm Unlabored breathing 2+ radial pulses  PERTINENT LABORATORY AND RADIOLOGIC DATA  Most recent CBC    Latest Ref Rng & Units 06/24/2022    5:16 AM 06/21/2022    5:03 AM 06/20/2022    5:41 AM  CBC  WBC 4.0 - 10.5 K/uL 12.2  10.6  12.1   Hemoglobin 12.0 - 15.0 g/dL 7.7  8.2  7.6   Hematocrit 36.0 - 46.0 % 24.7  26.1  24.1   Platelets 150 - 400 K/uL 196  192  193      Most recent CMP    Latest Ref Rng & Units 06/24/2022    5:16 AM 06/23/2022    5:05 AM 06/22/2022    5:09 AM  CMP  Glucose 70 - 99 mg/dL 216  244  695   BUN 6 - 20 mg/dL 072  257  505   Creatinine 0.44 - 1.00 mg/dL 1.83  3.58  2.51   Sodium 135 - 145 mmol/L 136  134  131   Potassium 3.5 - 5.1 mmol/L 3.1  3.6  3.5   Chloride 98 - 111 mmol/L 107  105  105   CO2 22 - 32 mmol/L 16  16  13    Calcium 8.9 - 10.3 mg/dL 8.3  8.2  8.0     Renal function Estimated Creatinine Clearance: 17.8 mL/min (A) (by C-G formula based on SCr of 5.21 mg/dL (H)).  HbA1c, POC (controlled diabetic range) (%)  Date Value  11/15/2019 9.3 (A)   Hgb A1c MFr Bld (%)  Date Value  06/19/2022 8.1 (H)    LDL Chol Calc (NIH)  Date Value Ref Range Status  12/13/2018 38 0 - 99 mg/dL Final   LDL Cholesterol  Date Value Ref Range Status  07/22/2019 37 0 - 99 mg/dL Final    Comment:           Total Cholesterol/HDL:CHD Risk Coronary Heart Disease Risk Table                     Men   Women  1/2 Average Risk   3.4   3.3  Average Risk       5.0   4.4  2 X Average Risk   9.6   7.1  3 X Average Risk  23.4   11.0         Use the calculated Patient Ratio above and the CHD Risk Table to determine the patient's CHD Risk.        ATP III CLASSIFICATION (LDL):  <100     mg/dL   Optimal  898-421  mg/dL   Near or Above  Optimal  130-159  mg/dL   Borderline  161-096  mg/dL   High  >045     mg/dL   Very High Performed at [facility], [address], [place], [place] [address]     Rande Brunt. Lenell Antu, MD FACS [facility] Phone Number: [phone] 06/24/2022 3:01 PM   Total time spent on preparing this encounter including chart review, data review, collecting history, examining the patient, coordinating care for this new patient, 60 minutes.  Portions of this report may have been transcribed using voice recognition software.  Every effort has been made to ensure accuracy; however, inadvertent computerized transcription errors may still be present.

## 2022-06-25 ENCOUNTER — Inpatient Hospital Stay (HOSPITAL_COMMUNITY): Payer: 59

## 2022-06-25 DIAGNOSIS — N186 End stage renal disease: Secondary | ICD-10-CM | POA: Diagnosis not present

## 2022-06-25 DIAGNOSIS — J189 Pneumonia, unspecified organism: Secondary | ICD-10-CM | POA: Diagnosis not present

## 2022-06-25 LAB — RENAL FUNCTION PANEL
Albumin: 2 g/dL — ABNORMAL LOW (ref 3.5–5.0)
Anion gap: 16 — ABNORMAL HIGH (ref 5–15)
BUN: 103 mg/dL — ABNORMAL HIGH (ref 6–20)
CO2: 14 mmol/L — ABNORMAL LOW (ref 22–32)
Calcium: 8.3 mg/dL — ABNORMAL LOW (ref 8.9–10.3)
Chloride: 108 mmol/L (ref 98–111)
Creatinine, Ser: 5.23 mg/dL — ABNORMAL HIGH (ref 0.44–1.00)
GFR, Estimated: 9 mL/min — ABNORMAL LOW (ref >60)
Glucose, Bld: 159 mg/dL — ABNORMAL HIGH (ref 70–99)
Phosphorus: 8.1 mg/dL — ABNORMAL HIGH (ref 2.5–4.6)
Potassium: 3.3 mmol/L — ABNORMAL LOW (ref 3.5–5.1)
Sodium: 138 mmol/L (ref 135–145)

## 2022-06-25 LAB — HEPATITIS B SURFACE ANTIBODY, QUANTITATIVE: Hep B S AB Quant (Post): 383 m[IU]/mL (ref >9.9)

## 2022-06-25 LAB — CBC
HCT: 23.7 % — ABNORMAL LOW (ref 36.0–46.0)
Hemoglobin: 7.4 g/dL — ABNORMAL LOW (ref 12.0–15.0)
MCH: 27.1 pg (ref 26.0–34.0)
MCHC: 31.2 g/dL (ref 30.0–36.0)
MCV: 86.8 fL (ref 80.0–100.0)
Platelets: 216 10*3/uL (ref 150–400)
RBC: 2.73 MIL/uL — ABNORMAL LOW (ref 3.87–5.11)
RDW: 20.5 % — ABNORMAL HIGH (ref 11.5–15.5)
WBC: 13.5 10*3/uL — ABNORMAL HIGH (ref 4.0–10.5)
nRBC: 0 % (ref 0.0–0.2)

## 2022-06-25 LAB — GLUCOSE, CAPILLARY
Glucose-Capillary: 149 mg/dL — ABNORMAL HIGH (ref 70–99)
Glucose-Capillary: 146 mg/dL — ABNORMAL HIGH (ref 70–99)
Glucose-Capillary: 310 mg/dL — ABNORMAL HIGH (ref 70–99)
Glucose-Capillary: 333 mg/dL — ABNORMAL HIGH (ref 70–99)

## 2022-06-25 MED ORDER — ORAL CARE MOUTH RINSE: 15.0000 mL | OROMUCOSAL | Status: DC | PRN

## 2022-06-25 MED ORDER — INSULIN ASPART 100 UNIT/ML IJ SOLN
0.0000 [IU] | Freq: Every day | INTRAMUSCULAR | Status: DC
  Administered 2022-06-25: 4 [IU] via SUBCUTANEOUS
  Administered 2022-06-26: 2 [IU] via SUBCUTANEOUS
  Administered 2022-06-27 – 2022-06-28 (×2): 3 [IU] via SUBCUTANEOUS
  Administered 2022-06-30: 4 [IU] via SUBCUTANEOUS
  Administered 2022-07-02 – 2022-07-03 (×2): 2 [IU] via SUBCUTANEOUS

## 2022-06-25 MED ORDER — ALTEPLASE 2 MG IJ SOLR: 2.0000 mg | Freq: Once | INTRAMUSCULAR | Status: DC | PRN

## 2022-06-25 MED ORDER — CHLORHEXIDINE GLUCONATE CLOTH 2 % EX PADS: 6.0000 | MEDICATED_PAD | Freq: Every day | CUTANEOUS | Status: DC

## 2022-06-25 MED ORDER — HEPARIN SODIUM (PORCINE) 1000 UNIT/ML DIALYSIS
1000.0000 [IU] | INTRAMUSCULAR | Status: DC | PRN
  Administered 2022-06-25: 1000 [IU]
  Filled 2022-06-25: qty 1

## 2022-06-25 MED ORDER — HEPARIN SODIUM (PORCINE) 1000 UNIT/ML DIALYSIS
2500.0000 [IU] | INTRAMUSCULAR | Status: DC | PRN
  Administered 2022-06-25: 2500 [IU] via INTRAVENOUS_CENTRAL
  Filled 2022-06-25 (×2): qty 3

## 2022-06-25 MED ORDER — ANTICOAGULANT SODIUM CITRATE 4% (200MG/5ML) IV SOLN: 5.0000 mL | Status: DC | PRN

## 2022-06-25 MED ORDER — INSULIN ASPART 100 UNIT/ML IJ SOLN
0.0000 [IU] | Freq: Three times a day (TID) | INTRAMUSCULAR | Status: DC
  Administered 2022-06-26: 2 [IU] via SUBCUTANEOUS
  Administered 2022-06-27: 1 [IU] via SUBCUTANEOUS
  Administered 2022-06-27: 3 [IU] via SUBCUTANEOUS
  Administered 2022-06-27: 2 [IU] via SUBCUTANEOUS
  Administered 2022-06-28: 3 [IU] via SUBCUTANEOUS
  Administered 2022-06-29 (×2): 1 [IU] via SUBCUTANEOUS
  Administered 2022-06-29: 2 [IU] via SUBCUTANEOUS
  Administered 2022-06-30: 1 [IU] via SUBCUTANEOUS
  Administered 2022-07-01: 5 [IU] via SUBCUTANEOUS
  Administered 2022-07-01: 3 [IU] via SUBCUTANEOUS
  Administered 2022-07-01: 5 [IU] via SUBCUTANEOUS
  Administered 2022-07-02: 1 [IU] via SUBCUTANEOUS
  Administered 2022-07-02: 2 [IU] via SUBCUTANEOUS
  Administered 2022-07-04 – 2022-07-05 (×4): 1 [IU] via SUBCUTANEOUS

## 2022-06-25 MED ORDER — ALUM & MAG HYDROXIDE-SIMETH 200-200-20 MG/5ML PO SUSP: 30.0000 mL | Freq: Four times a day (QID) | ORAL | Status: DC | PRN

## 2022-06-25 NOTE — Progress Notes (Signed)
Bilateral upper extremity vein mapping study completed.   Preliminary results relayed to RN.  Please see CV Procedures for preliminary results.  Ash Mcelwain, RVT  3:14 PM 06/25/22

## 2022-06-25 NOTE — Progress Notes (Signed)
Admit: 06/18/2022 LOS: 6  81F AoCKD4, multifocal pneumonia likley 2/2 metapneumovirus, recent admission for disseminated gout with encephalopathy and AKI, just discharged from SNF.   Subjective: TDC placed yesterday, on HD this am   Physical Exam:  Blood pressure 114/83, pulse (!) 109, temperature 98.3 F (36.8 C), temperature source Oral, resp. rate 20, height  (1.676 m), weight (!) 145.3 kg, last menstrual period 10/10/2012, SpO2 100 %. GEN: Chronically ill-appearing, morbidly obese female, up at side of bed ENT: NCAT EYES: EOMI CV: Regular, tachycardic, no rub, normal S1 and S2 PULM: Diminished in the bases, distant throughout ABD: benign, nontender EXT: 2+ diffuse LE edema throughout bilat LE's    Date   Creat  eGFR  2018-2020  1.01- 1.39   2021   1.31- 2.70  2022   1.64- 4.38  2023   1.72- 6.25 Mar 2022  3.02- 5.93 8- 17 ml/min  March 2024  1.92- 4.59 10-30 ml/min  4/12   4.84  10 ml/min    4/13   4.82  4/14   4.83  06/20/21  4.99  9 ml/min     B/l creat 2.1- 3.1 from march 2023, eGFR 17- 28 ml/ min   UA 11-20 wbc, 5-10 rbc from 06/19/22, prot 30   CT abd renal --> normal appearing kidneys w/o obstruction    Assessment/ Plan AoCKD4: b/l creat 2.1- 3.1 from march 2023, eGFR 17-28 ml/min. Creat here 4.8 on admission in setting of MPV resp infection and lower ext edema. Followed by Dr Thedore Mins at [facility]. UA unremarkable, CT abd w/o obstruction. Lasix started at 40 mg bid IV and now is up to  IV tid. UOP is 1500- 2000 per day, no sig change in edema and no improvement in weights compared to admit 6 days ago. Her brother (esrd on HD) says her QOL is "horrible" and has been for the last couple of years due to her chronic fluid overload issues. Have d/w the patient, not getting better here despite high dose lasix, we should start her on dialysis and she was agreeable. TDC placed 4/18 by IR. HD today and tomorrow.  HD access - appreciate VVS consult  Metapneumovirus - w/  multifocal pneumonia R > L, sp IV vanc + cefepime, BCx NGTD. Is now on po doxy. Improving.  Lower extremity edema - bilat diffuse LE"s. Will dc IV lasix now that is on dialysis.  Hypertension - on amlodipine, hydralazine, carvedilol Gout - with severe hyperuricemia on prednisone colchicine and allopurinol Anemia -  worsening, transfuse per TR[facility] History of HFpEF, normal LVEF DM2 with history of HHS Atrial fibrillation - on rate control and apixaban Lumbar spinal stenosis. Chronic pain on narcotics   Vinson Moselle, MD 06/25/2022, 6:12 PM  Recent Labs  Lab 06/19/22 0517 06/20/22 0541 06/24/22 0516 06/25/22 0759  HGB 6.9*   < > 7.7* 7.4*  ALBUMIN 2.7*  --   --  2.0*  CALCIUM 8.1*   < > 8.3* 8.3*  PHOS  --   --   --  8.1*  CREATININE 4.82*   < > 5.21* 5.23*  K 3.4*   < > 3.1* 3.3*   < > = values in this interval not displayed.     Inpatient medications:  allopurinol  50 mg Oral Once per day on Sun Wed   apixaban  5 mg Oral BID   atorvastatin  10 mg Oral Daily   carvedilol  12.5 mg Oral BID WC   Chlorhexidine Gluconate  Cloth  6 each Topical Daily   Chlorhexidine Gluconate Cloth  6 each Topical Q0600   donepezil  5 mg Oral QHS   doxycycline  100 mg Oral Q12H   febuxostat  40 mg Oral Daily   ferrous sulfate  325 mg Oral BID WC   folic acid  1 mg Oral Daily   guaiFENesin  600 mg Oral BID   insulin aspart  0-6 Units Subcutaneous TID WC   insulin aspart  4 Units Subcutaneous TID WC   insulin glargine-yfgn  8 Units Subcutaneous QHS   mirtazapine  30 mg Oral QHS   pantoprazole  40 mg Oral BID    furosemide 120 mg (06/25/22 1350)   albuterol, alum & mag hydroxide-simeth, guaiFENesin, HYDROmorphone, loperamide, ondansetron **OR** ondansetron (ZOFRAN) IV, mouth rinse

## 2022-06-25 NOTE — Plan of Care (Signed)
  Problem: Activity: Goal: Ability to tolerate increased activity will improve Outcome: Progressing   Problem: Clinical Measurements: Goal: Ability to maintain a body temperature in the normal range will improve Outcome: Progressing   Problem: Respiratory: Goal: Ability to maintain adequate ventilation will improve Outcome: Progressing Goal: Ability to maintain a clear airway will improve Outcome: Progressing   Problem: Education: Goal: Knowledge of General Education information will improve Description: Including pain rating scale, medication(s)/side effects and non-pharmacologic comfort measures Outcome: Progressing   Problem: Activity: Goal: Risk for activity intolerance will decrease Outcome: Progressing   Problem: Nutrition: Goal: Adequate nutrition will be maintained Outcome: Progressing   Problem: Coping: Goal: Level of anxiety will decrease Outcome: Progressing   Problem: Elimination: Goal: Will not experience complications related to bowel motility Outcome: Progressing Goal: Will not experience complications related to urinary retention Outcome: Progressing   Problem: Pain Managment: Goal: General experience of comfort will improve Outcome: Progressing

## 2022-06-25 NOTE — Progress Notes (Signed)
PROGRESS NOTE Deborah [name]  ZOX:[id number] DOB: 05/11/1965 DOA: 06/18/2022 PCP: Elsie Amis, MD  Brief Narrative/Hospital Course: 57 y.o.f w/ medical history significant for insulin-dependent diabetes mellitus, hypertension, PAF on Eliquis, CKD stage IV, iron deficiency anemia, chronic pain, duodenal carcinoid tumor resected in 2021 and recent admission 05/28/22 -06/04/22 with diagnosis of acute metabolic encephalopathy due to dehydration with associated AKI on CKDIV, with course complicated by disseminated gout and discharged on colchine, Uloric  and allopurinol as well as steroid taper to SNF presented with worsening shortness of breath, fluid overload past 24 hours In the WJ:XBJYNW has revealed multiple opacities on chest x-ray, COVID-19 is negative and respiratory viral panel is positive for metapneumovirus infection.  Placed on empiric antibiotics, seen by nephrology for fluid overload and placed on high-dose Lasix without improvement. Subsequently transferred to [facility] 4/18 for dialysis and access Underwent right IJ tunneled HD catheter placement by IR 4/18 Seen by Dr. Lenell Antu from vascular for HD access advised supportive left upper extremity from further venipuncture and awaiting vein mapping   Subjective: Patient seen and examined this morning She underwent dialysis Overnight patient is afebrile blood pressure 1 1 10-1 40s, not hypoxic Labs pending > this morning and finally resulted hemoglobin at 7.4 potassium 3.3 BUN 103 with creatinine 5.2 She c/o abdomen pain and feels weak all over   Assessment and Plan: Principal Problem:   HCAP (healthcare-associated pneumonia)  Acute on chronic kidney disease stage IV Bilateral lower extremity edema: B/L creat 2.1-3.1 from March 2023 EGFR 17-28, 4.8 creatinine on admission in the setting of metapneumovirus respiratory infection and with fluid overload.  UA unremarkable CT abdomen without obstruction, placed on high-dose  Lasix without improvement in leg edema despite urine output 1.5-2 L/day.  Subsequently underwent tunneled HD placement and dialysis initiated 4/19.  Continue plan per nephrology.  PVS following for access.  Hypokalemia: Replace/adjust HD per nephrology, k at 3.3 Metabolic acidosis in the setting of CKD and AKI monitor.  Continue per nephrology. Hco3 at 14 this am.  Metapneumovirus pneumonia with multifocal pneumonia right more than left: Initially received vancomycin and cefepime blood culture no growth to date now transition to doxycycline.  Continue OOB, respiratory support, pulmonary hygiene I-S bronchodilators as needed Recent Labs  Lab 06/18/22 2334 06/19/22 0517 06/20/22 0541 06/21/22 0503 06/24/22 0516 06/25/22 0759  WBC  --  12.6* 12.1* 10.6* 12.2* 13.5*  LATICACIDVEN 1.6  --   --   --   --   --   PROCALCITON  --   --   --  0.33  --   --     Chronic CHF with preserved EF: Fluid management per nephrology with diuretics and dialysis. Net IO Since Admission: -5,258.64 mL [06/25/22 1246]   Anemia multifactorial from IDA and CKD: Continue iron supplement, folate monitor hemoglobin and transfuse if less than 7 g.  Pt had 2 u prbc done on 4/13.  Anticipate she will need blood transfusion soon Recent Labs  Lab 06/19/22 0517 06/20/22 0541 06/21/22 0503 06/24/22 0516 06/25/22 0759  HGB 6.9* 7.6* 8.2* 7.7* 7.4*  HCT 22.5* 24.1* 26.1* 24.7* 23.7*    Hyperlipidemia continue statin Hypertension BP stable on Coreg Atrial fibrillation rate controlled on Coreg, anticoagulated on Eliquis.  Chronic pain Left-sided abdominal pain Lumbar stenosis: Underwent CT renal study without obvious acute finding, has diverticulosis.  LFTs are stable.  Lipase mildly elevated but in the setting of CKD AKI.  LTM has been benign continue pain management stool softener  Hyperuricemia/gout with recent flare of and disseminated into hyperuricemia and encephalopathy: Uric acid now down to 8, was 16.5 at  previous admission.  Continue colchicine, allopurinol completed prednisone taper previously Continues on colchicine and ULORIC. Patient completed her last dose of prednisone .  Type 2 diabetes mellitus history of HHS: HbA1c poorly controlled 8.1, continue SSI, 0 to 6 units, 4 units Premeal, 8 units Semglee daily blood sugar remains stable Recent Labs  Lab 06/19/22 0240 06/19/22 1710 06/24/22 1135 06/24/22 1726 06/24/22 2120 06/25/22 0717 06/25/22 1235  GLUCAP  --    < > 174* 175* 229* 149* 146*  HGBA1C 8.1*  --   --   --   --   --   --    < > = values in this interval not displayed.    Morbid Obesity:Patient's Body mass index is 51.7 kg/m. : Will benefit with PCP follow-up, weight loss  healthy lifestyle and outpatient sleep evaluation.   DVT prophylaxis:  Code Status:   Code Status: Full Code Family Communication: plan of care discussed with patient at bedside. Patient status is: Inpatient because of need for dialysis Level of care: Telemetry Medical   Dispo: The patient is from: SNF            Anticipated disposition: SNF TBD Objective: Vitals last 24 hrs: Vitals:   06/25/22 1030 06/25/22 1100 06/25/22 1103 06/25/22 1110  BP: 127/66 118/75 (!) 147/74 128/77  Pulse: (!) 49 (!) 105 (!) 108 (!) 105  Resp: 17 17 (!) 22 15  Temp:    98.2 F (36.8 C)  TempSrc:    Oral  SpO2: 100% 100% 100% 100%  Weight:    (!) 145.3 kg  Height:       Weight change: 0.6 kg  Physical Examination:  General exam: alert awake oriented, obese. HEENT:Oral mucosa moist, Ear/Nose WNL grossly Respiratory system: bilaterally diminished BS, TDC+ Cardiovascular system: S1 & S2 + No JVD. Gastrointestinal system: Abdomen soft,NT,ND, BS+ Nervous System:Alert, awake, moving extremities. Extremities: LE edema non pitting Skin: No rashes,no icterus. MSK: Normal muscle bulk,tone, power  Medications reviewed: Scheduled Meds:  allopurinol  50 mg Oral Once per day on Sun Wed   apixaban  5 mg Oral BID    atorvastatin  10 mg Oral Daily   carvedilol  12.5 mg Oral BID WC   Chlorhexidine Gluconate Cloth  6 each Topical Daily   Chlorhexidine Gluconate Cloth  6 each Topical Q0600   donepezil  5 mg Oral QHS   doxycycline  100 mg Oral Q12H   febuxostat  40 mg Oral Daily   ferrous sulfate  325 mg Oral BID WC   folic acid  1 mg Oral Daily   guaiFENesin  600 mg Oral BID   insulin aspart  0-6 Units Subcutaneous TID WC   insulin aspart  4 Units Subcutaneous TID WC   insulin glargine-yfgn  8 Units Subcutaneous QHS   mirtazapine  30 mg Oral QHS   pantoprazole  40 mg Oral BID  Continuous Infusions:  furosemide 120 mg (06/25/22 0202)    Diet Order             Diet renal with fluid restriction Fluid restriction: 1200 mL Fluid; Room service appropriate? Yes; Fluid consistency: Thin  Diet effective now                  Intake/Output Summary (Last 24 hours) at 06/25/2022 1246 Last data filed at 06/25/2022 1110 Gross per 24 hour  Intake 470 ml  Output 5200 ml  Net -4730 ml   Net IO Since Admission: -5,258.64 mL [06/25/22 1246]  Wt Readings from Last 3 Encounters:  06/25/22 (!) 145.3 kg  06/04/22 130.5 kg  05/13/22 (!) 139.2 kg     Unresulted Labs (From admission, onward)     Start     Ordered   06/26/22 0500  CBC  Daily,   R      06/25/22 0819   06/25/22 0500  Basic metabolic panel  Daily,   R      06/24/22 1040          Data Reviewed: I have personally reviewed following labs and imaging studies CBC: Recent Labs  Lab 06/19/22 0517 06/20/22 0541 06/21/22 0503 06/24/22 0516 06/25/22 0759  WBC 12.6* 12.1* 10.6* 12.2* 13.5*  HGB 6.9* 7.6* 8.2* 7.7* 7.4*  HCT 22.5* 24.1* 26.1* 24.7* 23.7*  MCV 88.6 85.5 86.1 86.1 86.8  PLT 232 193 192 196 216   Basic Metabolic Panel: Recent Labs  Lab 06/21/22 0503 06/22/22 0509 06/23/22 0505 06/24/22 0516 06/25/22 0759  NA 128* 131* 134* 136 138  K 3.4* 3.5 3.6 3.1* 3.3*  CL 101 105 105 107 108  CO2 14* 13* 16* 16* 14*   GLUCOSE 213* 269* 279* 176* 159*  BUN 93* 102* 109* 110* 103*  CREATININE 4.99* 5.04* 5.13* 5.21* 5.23*  CALCIUM 8.2* 8.0* 8.2* 8.3* 8.3*  PHOS  --   --   --   --  8.1*   GFR: Estimated Creatinine Clearance: 17.3 mL/min (A) (by C-G formula based on SCr of 5.23 mg/dL (H)). Liver Function Tests: Recent Labs  Lab 06/18/22 2132 06/19/22 0517 06/25/22 0759  AST 18 14*  --   ALT 31 28  --   ALKPHOS 92 77  --   BILITOT 0.4 0.8  --   PROT 7.0 6.5  --   ALBUMIN 2.9* 2.7* 2.0*   Recent Labs  Lab 06/18/22 2132  LIPASE 88*  CBG: Recent Labs  Lab 06/24/22 1135 06/24/22 1726 06/24/22 2120 06/25/22 0717 06/25/22 1235  GLUCAP 174* 175* 229* 149* 146*   Recent Labs  Lab 06/18/22 2334 06/21/22 0503  PROCALCITON  --  0.33  LATICACIDVEN 1.6  --     Recent Results (from the past 240 hour(s))  SARS Coronavirus 2 by RT PCR (hospital order, performed in [facility] hospital lab) *cepheid single result test* Anterior Nasal Swab     Status: None   Collection Time: 06/18/22  9:16 PM   Specimen: Anterior Nasal Swab  Result Value Ref Range Status   SARS Coronavirus 2 by RT PCR NEGATIVE NEGATIVE Final    Comment: (NOTE) SARS-CoV-2 target nucleic acids are NOT DETECTED.  The SARS-CoV-2 RNA is generally detectable in upper and lower respiratory specimens during the acute phase of infection. The lowest concentration of SARS-CoV-2 viral copies this assay can detect is 250 copies / mL. A negative result does not preclude SARS-CoV-2 infection and should not be used as the sole basis for treatment or other patient management decisions.  A negative result may occur with improper specimen collection / handling, submission of specimen other than nasopharyngeal swab, presence of viral mutation(s) within the areas targeted by this assay, and inadequate number of viral copies (<250 copies / mL). A negative result must be combined with clinical observations, patient history, and epidemiological  information.  Fact Sheet for Patients:   RoadLapTop.co.za  Fact Sheet for Healthcare Providers: http://kim-miller.com/  This  test is not yet approved or  cleared by the [place] and has been authorized for detection and/or diagnosis of SARS-CoV-2 by FDA under an Emergency Use Authorization (EUA).  This EUA will remain in effect (meaning this test can be used) for the duration of the COVID-19 declaration under Section 564(b)(1) of the Act, 21 U.S.C. section 360bbb-3(b)(1), unless the authorization is terminated or revoked sooner.  Performed at [name], [address] [place], [place] [address]   Blood culture (routine x 2)     Status: None   Collection Time: 06/18/22 11:29 PM   Specimen: BLOOD  Result Value Ref Range Status   Specimen Description   Final    BLOOD BLOOD LEFT ARM Performed at [facility], [address], [place], [place] [address]    Special Requests   Final    BOTTLES DRAWN AEROBIC ONLY Blood Culture adequate volume Performed at [facility], [address], [place], [place] [address]    Culture   Final    NO GROWTH 5 DAYS Performed at [facility], [address], [place], [place] [address]    Report Status 06/24/2022 FINAL  Final  Blood culture (routine x 2)     Status: None   Collection Time: 06/18/22 11:34 PM   Specimen: BLOOD  Result Value Ref Range Status   Specimen Description   Final    BLOOD BLOOD LEFT ARM Performed at [facility], [address], [place], [place] [address]    Special Requests   Final    BOTTLES DRAWN AEROBIC AND ANAEROBIC Blood Culture adequate volume Performed at [facility], [address], [place], [place] [address]    Culture   Final    NO GROWTH 5 DAYS Performed at [facility], [address] [place], [place] [address]    Report Status 06/24/2022  FINAL  Final  Respiratory (~20 pathogens) panel by PCR     Status: Abnormal   Collection Time: 06/19/22  4:15 PM   Specimen: Nasopharyngeal Swab; Respiratory  Result Value Ref Range Status   Adenovirus NOT DETECTED NOT DETECTED Final   Coronavirus 229E NOT DETECTED NOT DETECTED Final    Comment: (NOTE) The Coronavirus on the Respiratory Panel, DOES NOT test for the novel  Coronavirus (2019 nCoV)    Coronavirus HKU1 NOT DETECTED NOT DETECTED Final   Coronavirus NL63 NOT DETECTED NOT DETECTED Final   Coronavirus OC43 NOT DETECTED NOT DETECTED Final   Metapneumovirus DETECTED (A) NOT DETECTED Final   Rhinovirus / Enterovirus NOT DETECTED NOT DETECTED Final   Influenza A NOT DETECTED NOT DETECTED Final   Influenza B NOT DETECTED NOT DETECTED Final   Parainfluenza Virus 1 NOT DETECTED NOT DETECTED Final   Parainfluenza Virus 2 NOT DETECTED NOT DETECTED Final   Parainfluenza Virus 3 NOT DETECTED NOT DETECTED Final   Parainfluenza Virus 4 NOT DETECTED NOT DETECTED Final   Respiratory Syncytial Virus NOT DETECTED NOT DETECTED Final   Bordetella pertussis NOT DETECTED NOT DETECTED Final   Bordetella Parapertussis NOT DETECTED NOT DETECTED Final   Chlamydophila pneumoniae NOT DETECTED NOT DETECTED Final   Mycoplasma pneumoniae NOT DETECTED NOT DETECTED Final    Comment: Performed at [facility], [address], [place], [place] [address]  MRSA Next Gen by PCR, Nasal     Status: None   Collection Time: 06/19/22  4:15 PM   Specimen: Nasal Mucosa; Nasal Swab  Result Value Ref Range Status   MRSA by PCR Next Gen NOT DETECTED NOT DETECTED Final    Comment: (NOTE) The GeneXpert MRSA Assay (FDA approved for NASAL specimens only), is one component of a comprehensive MRSA colonization surveillance program. It is not intended to diagnose MRSA infection nor to guide or monitor treatment for MRSA infections. Test performance is not FDA approved in patients less than 41 years old. Performed at  [facility], [address] [place], [place] [address]     Antimicrobials: Anti-infectives (From admission, onward)    Start     Dose/Rate Route Frequency Ordered Stop   06/24/22 1615  vancomycin (VANCOCIN) IVPB 1000 mg/200 mL premix        1,000 mg 200 mL/hr over 60 Minutes Intravenous  Once 06/24/22 1516 06/24/22 1729   06/21/22 1100  doxycycline (VIBRA-TABS) tablet 100 mg        100 mg Oral Every 12 hours 06/21/22 1002 06/26/22 0959   06/19/22 2300  ceFEPIme (MAXIPIME) 2 g in sodium chloride 0.9 % 100 mL IVPB  Status:  Discontinued        2 g 200 mL/hr over 30 Minutes Intravenous Every 24 hours 06/19/22 0156 06/21/22 1002   06/19/22 0200  vancomycin (VANCOREADY) IVPB 1500 mg/300 mL        1,500 mg 150 mL/hr over 120 Minutes Intravenous  Once 06/19/22 0156 06/19/22 0622   06/19/22 0157  vancomycin variable dose per unstable renal function (pharmacist dosing)  Status:  Discontinued         Does not apply See admin instructions 06/19/22 0157 06/20/22 1123   06/18/22 2330  ceFEPIme (MAXIPIME) 2 g in sodium chloride 0.9 % 100 mL IVPB        2 g 200 mL/hr over 30 Minutes Intravenous  Once 06/18/22 2317 06/19/22 0007   06/18/22 2315  vancomycin (VANCOCIN) IVPB 1000 mg/200 mL premix        1,000 mg 200 mL/hr over 60 Minutes Intravenous  Once 06/18/22 2306 06/19/22 0203      Culture/Microbiology    Component Value Date/Time   SDES  06/18/2022 2334    BLOOD BLOOD LEFT ARM Performed at [facility], [address], [place], [place] [address]    SPECREQUEST  06/18/2022 2334    BOTTLES DRAWN AEROBIC AND ANAEROBIC Blood Culture adequate volume Performed at [facility], [address] [place], [place] [address]    CULT  06/18/2022 2334    NO GROWTH 5 DAYS Performed at [facility], [address], [place], [place] [address]    REPTSTATUS 06/24/2022 FINAL 06/18/2022 2334  Other culture-see note  Radiology  Studies: IR Fluoro Guide CV Line Right  Result Date: 06/24/2022 INDICATION: 57 year old female referred for tunneled hemodialysis catheter EXAM: TUNNELED CENTRAL VENOUS HEMODIALYSIS CATHETER PLACEMENT WITH ULTRASOUND AND FLUOROSCOPIC GUIDANCE MEDICATIONS: 1 g vancomycin. The antibiotic was given in an appropriate time interval prior to skin puncture. ANESTHESIA/SEDATION: Moderate (conscious) sedation was not employed during this procedure. A total of Versed 0.5 mg and Fentanyl 0 mcg was administered intravenously by the radiology nurse. Total intra-service moderate Sedation Time: 0 minutes. The patient's level of consciousness and vital signs were monitored continuously by radiology nursing throughout the procedure under my direct supervision. FLUOROSCOPY: Radiation Exposure Index (as provided by the fluoroscopic device): 6 mGy Kerma COMPLICATIONS: None PROCEDURE: Informed written consent was obtained from the patient after a discussion of the risks, benefits, and alternatives to treatment.  Questions regarding the procedure were encouraged and answered. The right neck and chest were prepped with chlorhexidine in a sterile fashion, and a sterile drape was applied covering the operative field. Maximum barrier sterile technique with sterile gowns and gloves were used for the procedure. A timeout was performed prior to the initiation of the procedure. Ultrasound survey was performed. The right internal jugular vein was confirmed to be patent, with images stored and sent to PACS. Micropuncture kit was utilized to access the right internal jugular vein under direct, real-time ultrasound guidance after the overlying soft tissues were anesthetized with 1% lidocaine with epinephrine. Stab incision was made with 11 blade scalpel. Microwire was passed centrally. The microwire was then marked to measure appropriate internal catheter length. External tunneled length was estimated. A total tip to cuff length of 23 cm was  selected. 035 guidewire was advanced to the level of the IVC. Skin and subcutaneous tissues of chest wall below the clavicle were generously infiltrated with 1% lidocaine for local anesthesia. A small stab incision was made with 11 blade scalpel. The selected hemodialysis catheter was tunneled in a retrograde fashion from the anterior chest wall to the venotomy incision. Serial dilation was performed and then a peel-away sheath was placed. The catheter was then placed through the peel-away sheath with tips ultimately positioned within the superior aspect of the right atrium. Final catheter positioning was confirmed and documented with a spot radiographic image. The catheter aspirates and flushes normally. The catheter was flushed with appropriate volume heparin dwells. The catheter exit site was secured with a 0-Prolene retention suture. Gel-Foam slurry was infused into the soft tissue tract. The venotomy incision was closed Derma bond and sterile dressing. Dressings were applied at the chest wall. Patient tolerated the procedure well and remained hemodynamically stable throughout. No complications were encountered and no significant blood loss encountered. IMPRESSION: Status post image guided right IJ tunneled hemodialysis catheter. Signed, Yvone Neu. Miachel Roux, RPVI [facility] Radiology Electronically Signed   By: Gilmer Mor D.O.   On: 06/24/2022 17:23   IR US Guide Vasc Access Right  Result Date: 06/24/2022 INDICATION: 57 year old female referred for tunneled hemodialysis catheter EXAM: TUNNELED CENTRAL VENOUS HEMODIALYSIS CATHETER PLACEMENT WITH ULTRASOUND AND FLUOROSCOPIC GUIDANCE MEDICATIONS: 1 g vancomycin. The antibiotic was given in an appropriate time interval prior to skin puncture. ANESTHESIA/SEDATION: Moderate (conscious) sedation was not employed during this procedure. A total of Versed 0.5 mg and Fentanyl 0 mcg was administered  intravenously by the radiology nurse. Total intra-service moderate Sedation Time: 0 minutes. The patient's level of consciousness and vital signs were monitored continuously by radiology nursing throughout the procedure under my direct supervision. FLUOROSCOPY: Radiation Exposure Index (as provided by the fluoroscopic device): 6 mGy Kerma COMPLICATIONS: None PROCEDURE: Informed written consent was obtained from the patient after a discussion of the risks, benefits, and alternatives to treatment. Questions regarding the procedure were encouraged and answered. The right neck and chest were prepped with chlorhexidine in a sterile fashion, and a sterile drape was applied covering the operative field. Maximum barrier sterile technique with sterile gowns and gloves were used for the procedure. A timeout was performed prior to the initiation of the procedure. Ultrasound survey was performed. The right internal jugular vein was confirmed to be patent, with images stored and sent to PACS. Micropuncture kit was utilized to access the right internal jugular vein under direct, real-time ultrasound guidance after the overlying soft tissues were anesthetized with 1% lidocaine  with epinephrine. Stab incision was made with 11 blade scalpel. Microwire was passed centrally. The microwire was then marked to measure appropriate internal catheter length. External tunneled length was estimated. A total tip to cuff length of 23 cm was selected. 035 guidewire was advanced to the level of the IVC. Skin and subcutaneous tissues of chest wall below the clavicle were generously infiltrated with 1% lidocaine for local anesthesia. A small stab incision was made with 11 blade scalpel. The selected hemodialysis catheter was tunneled in a retrograde fashion from the anterior chest wall to the venotomy incision. Serial dilation was performed and then a peel-away sheath was placed. The catheter was then placed through the peel-away sheath with tips  ultimately positioned within the superior aspect of the right atrium. Final catheter positioning was confirmed and documented with a spot radiographic image. The catheter aspirates and flushes normally. The catheter was flushed with appropriate volume heparin dwells. The catheter exit site was secured with a 0-Prolene retention suture. Gel-Foam slurry was infused into the soft tissue tract. The venotomy incision was closed Derma bond and sterile dressing. Dressings were applied at the chest wall. Patient tolerated the procedure well and remained hemodynamically stable throughout. No complications were encountered and no significant blood loss encountered. IMPRESSION: Status post image guided right IJ tunneled hemodialysis catheter. Signed, Yvone Neu. Miachel Roux, RPVI [facility] Radiology Electronically Signed   By: Gilmer Mor D.O.   On: 06/24/2022 17:23    LOS: 6 days  Lanae Boast, MD [facility]  06/25/2022, 12:46 PM

## 2022-06-25 NOTE — Progress Notes (Signed)
POST HD TX NOTE  06/25/22 1110  Vitals  Temp 98.2 F (36.8 C)  Temp Source Oral  BP 128/77  MAP (mmHg) 90  BP Location Left Wrist  BP Method Automatic  Patient Position (if appropriate) Lying  Pulse Rate (!) 105  Pulse Rate Source Monitor  ECG Heart Rate (!) 106  Resp 15  Oxygen Therapy  SpO2 100 %  O2 Device Room Air  Pulse Oximetry Type Continuous  During Treatment Monitoring  Intra-Hemodialysis Comments (S)   (post HD tx VS check)  Post Treatment  Dialyzer Clearance Lightly streaked  Duration of HD Treatment -hour(s) 3 hour(s)  Hemodialysis Intake (mL) 0 mL  Liters Processed 54  Fluid Removed (mL) 3500 mL  Tolerated HD Treatment Yes  Post-Hemodialysis Comments (S)  tx completed w/o problem, UF goal met, blood rinsed back, VSS. Medication Admin: Heparin 2500 units bolus, Heparin Dwells 3800 units  Hemodialysis Catheter Right Subclavian Double lumen Permanent (Tunneled)  Placement Date/Time: 06/24/22 1644   Serial / Lot #: [id number]  Expiration Date: 09/08/26  Time Out: Correct patient;Correct site;Correct procedure  Maximum sterile barrier precautions: Hand hygiene;Mask;Cap;Sterile gloves;Sterile gown;Large sterile ...  Site Condition No complications  Blue Lumen Status Heparin locked;Dead end cap in place  Red Lumen Status Heparin locked;Dead end cap in place  Purple Lumen Status N/A  Catheter fill solution Heparin 1000 units/ml  Catheter fill volume (Arterial) 1.9 cc  Catheter fill volume (Venous) 1.9  Dressing Type Transparent  Dressing Status Antimicrobial disc in place;Clean, Dry, Intact  Drainage Description None  Dressing Change Due 07/01/22  Post treatment catheter status Capped and Clamped

## 2022-06-25 NOTE — Progress Notes (Signed)
Patient ID: Deborah [name], female   DOB: 05/18/1965, 57 y.o.   MRN: [id number]  Seen just after first dialysis treatment. Patient tearful. I reassured her. We will plan to create left arm arteriovenous fistula vs. Graft on Monday with Dr. Karin Lieu. Please remove peripheral IV from left arm and restrict the left arm from further venipuncture. Please call for questions over the weekend.  Rande Brunt. Lenell Antu, MD [facility] Phone Number: [phone] 06/25/2022 11:35 AM

## 2022-06-25 NOTE — Hospital Course (Addendum)
57 y.o.f w/ medical history significant for insulin-dependent diabetes mellitus, hypertension, PAF on Eliquis, CKD stage IV, iron deficiency anemia, chronic pain, duodenal carcinoid tumor resected in 2021 and recent admission 05/28/22 -06/04/22 with diagnosis of acute metabolic encephalopathy due to dehydration with associated AKI on CKDIV, with course complicated by disseminated gout and discharged on colchine, Uloric  and allopurinol as well as steroid taper to SNF presented with worsening shortness of breath, fluid overload past 24 hours In the [id number] has revealed multiple opacities on chest x-ray, COVID-19 is negative and respiratory viral panel is positive for metapneumovirus infection.  Placed on empiric antibiotics, seen by nephrology for fluid overload and placed on high-dose Lasix without improvement. Subsequently transferred to [facility] 4/18 for dialysis and access Underwent right IJ tunneled HD catheter placement by IR 4/18 S/p Left  S/P creation of left brachiocephalic fistula 4/78  HB 4/25 AT 6.2G: Received 1 unit PRBC with improvement

## 2022-06-26 DIAGNOSIS — J189 Pneumonia, unspecified organism: Secondary | ICD-10-CM | POA: Diagnosis not present

## 2022-06-26 LAB — CBC
HCT: 23.1 % — ABNORMAL LOW (ref 36.0–46.0)
Hemoglobin: 7.8 g/dL — ABNORMAL LOW (ref 12.0–15.0)
MCH: 28.1 pg (ref 26.0–34.0)
MCHC: 33.8 g/dL (ref 30.0–36.0)
MCV: 83.1 fL (ref 80.0–100.0)
Platelets: 231 10*3/uL (ref 150–400)
RBC: 2.78 MIL/uL — ABNORMAL LOW (ref 3.87–5.11)
RDW: 20.6 % — ABNORMAL HIGH (ref 11.5–15.5)
WBC: 13.2 10*3/uL — ABNORMAL HIGH (ref 4.0–10.5)
nRBC: 0 % (ref 0.0–0.2)

## 2022-06-26 LAB — GLUCOSE, CAPILLARY
Glucose-Capillary: 136 mg/dL — ABNORMAL HIGH (ref 70–99)
Glucose-Capillary: 136 mg/dL — ABNORMAL HIGH (ref 70–99)
Glucose-Capillary: 222 mg/dL — ABNORMAL HIGH (ref 70–99)
Glucose-Capillary: 224 mg/dL — ABNORMAL HIGH (ref 70–99)

## 2022-06-26 LAB — BASIC METABOLIC PANEL
Anion gap: 11 (ref 5–15)
BUN: 62 mg/dL — ABNORMAL HIGH (ref 6–20)
CO2: 20 mmol/L — ABNORMAL LOW (ref 22–32)
Calcium: 8.1 mg/dL — ABNORMAL LOW (ref 8.9–10.3)
Chloride: 102 mmol/L (ref 98–111)
Creatinine, Ser: 4.14 mg/dL — ABNORMAL HIGH (ref 0.44–1.00)
GFR, Estimated: 12 mL/min — ABNORMAL LOW (ref >60)
Glucose, Bld: 195 mg/dL — ABNORMAL HIGH (ref 70–99)
Potassium: 3 mmol/L — ABNORMAL LOW (ref 3.5–5.1)
Sodium: 133 mmol/L — ABNORMAL LOW (ref 135–145)

## 2022-06-26 MED ORDER — HEPARIN SODIUM (PORCINE) 1000 UNIT/ML IJ SOLN
INTRAMUSCULAR | Status: AC
  Administered 2022-06-26: 3800 [IU]
  Filled 2022-06-26: qty 4

## 2022-06-26 MED ORDER — HEPARIN SODIUM (PORCINE) 1000 UNIT/ML IJ SOLN
3000.0000 [IU] | Freq: Once | INTRAMUSCULAR | Status: AC
  Administered 2022-06-26: 3000 [IU] via INTRAVENOUS
  Filled 2022-06-26: qty 3

## 2022-06-26 MED ORDER — POTASSIUM CHLORIDE CRYS ER 20 MEQ PO TBCR: 40.0000 meq | EXTENDED_RELEASE_TABLET | ORAL | Status: DC

## 2022-06-26 MED ORDER — INSULIN GLARGINE-YFGN 100 UNIT/ML ~~LOC~~ SOLN
10.0000 [IU] | Freq: Every day | SUBCUTANEOUS | Status: DC
  Administered 2022-06-26 – 2022-06-27 (×2): 10 [IU] via SUBCUTANEOUS
  Filled 2022-06-26 (×4): qty 0.1

## 2022-06-26 MED ORDER — POTASSIUM CHLORIDE CRYS ER 20 MEQ PO TBCR
40.0000 meq | EXTENDED_RELEASE_TABLET | Freq: Once | ORAL | Status: AC
  Administered 2022-06-26: 40 meq via ORAL
  Filled 2022-06-26: qty 2

## 2022-06-26 NOTE — Progress Notes (Signed)
PROGRESS NOTE Deborah [name]  ZOX:[id number] DOB: 07-12-1965 DOA: 06/18/2022 PCP: Elsie Amis, MD  Brief Narrative/Hospital Course: 57 y.o.f w/ medical history significant for insulin-dependent diabetes mellitus, hypertension, PAF on Eliquis, CKD stage IV, iron deficiency anemia, chronic pain, duodenal carcinoid tumor resected in 2021 and recent admission 05/28/22 -06/04/22 with diagnosis of acute metabolic encephalopathy due to dehydration with associated AKI on CKDIV, with course complicated by disseminated gout and discharged on colchine, Uloric  and allopurinol as well as steroid taper to SNF presented with worsening shortness of breath, fluid overload past 24 hours In the WJ:XBJYNW has revealed multiple opacities on chest x-ray, COVID-19 is negative and respiratory viral panel is positive for metapneumovirus infection.  Placed on empiric antibiotics, seen by nephrology for fluid overload and placed on high-dose Lasix without improvement. Subsequently transferred to [facility] 4/18 for dialysis and access Underwent right IJ tunneled HD catheter placement by IR 4/18 Seen by Dr. Lenell Antu from vascular for HD access advised supportive left upper extremity from further venipuncture and awaiting vein mapping    Subjective: Patient seen and examined in dialysis this morning She is somewhat upset about renal diet-waiting to talk with nephrology Overnight patient has been afebrile BP stable Saturating well on room air Holding down to 145.3 KG.  Previously as high as 155.6 on 4/17   Assessment and Plan: Principal Problem:   HCAP (healthcare-associated pneumonia)  Acute on chronic kidney disease stage IV Bilateral lower extremity edema: B/L creat 2.1-3.1 from March 2023>4.8 creatinine on admission.  AKI in the setting of metapneumovirus respiratory infection and with fluid overload.UA unremarkable CT abdomen without obstruction, placed on high-dose Lasix without improvement in leg  edema despite good urine output >subsequently underwent tunneled HD placement and dialysis initiated 4/19.  Continue plan per nephrology, fluid adjustment.VVS following for access.  Hypokalemia: Replace X1- cont to adjust HD per nephrology Metabolic acidosis in the setting of CKD and AKI monitor. Hco3 better at 20.   Metapneumovirus pneumonia with multifocal pneumonia right more than left: Initially received vancomycin and cefepime blood culture no growth to date transitioned to doxycycline and completed.  Continue to provide respiratory support withOOB, IS  bronchodilators as needed.  Has mild leukocytosis ongoing but afebrile Recent Labs  Lab 06/20/22 0541 06/21/22 0503 06/24/22 0516 06/25/22 0759 06/26/22 0316  WBC 12.1* 10.6* 12.2* 13.5* 13.2*  PROCALCITON  --  0.33  --   --   --     Chronic CHF with preserved EF: Fluid management per nephrology with diuretics and dialysis.Net IO Since Admission: -5,938.64 mL [06/26/22 1158] overall weight has improved to 145.3 kg.  Anemia multifactorial from IDA and CKD: Hemoglobin remains stable -Cont iron supplement, folate monitor> trend hb and transfuse if less than 7 g.  Pt had 2 u prbc done on 4/13. Recent Labs  Lab 06/20/22 0541 06/21/22 0503 06/24/22 0516 06/25/22 0759 06/26/22 0316  HGB 7.6* 8.2* 7.7* 7.4* 7.8*  HCT 24.1* 26.1* 24.7* 23.7* 23.1*    Hyperlipidemia continue statin Hypertension BP stable on Coreg Atrial fibrillation rate controlled on Coreg,and is on Eliquis for stroke prophylaxis.  Chronic pain Left-sided abdominal pain Lumbar stenosis: Underwent CT renal study without obvious acute finding, has diverticulosis.  LFTs are stable.  Lipase mildly elevated but in the setting of CKD AKI.  LTM has been benign continue pain management stool softener   Hyperuricemia/gout with recent flare of and disseminated into hyperuricemia and encephalopathy: Uric acid now down to 8, was 16.5 at previous admission.  Continue colchicine,  allopurinol. completed prednisone taper previously Continues on colchicine and ULORIC.  Type 2 diabetes mellitus history of HHS: HbA1c poorly controlled 8.1, increased Semglee to 10 units, continue Premeal and swelling insulin SSI 0 to 6 units  Recent Labs  Lab 06/25/22 0717 06/25/22 1235 06/25/22 1730 06/25/22 2231 06/26/22 0726  GLUCAP 149* 146* 310* 333* 136*    Morbid Obesity:Patient's Body mass index is 51.7 kg/m. : Will benefit with PCP follow-up, weight loss  healthy lifestyle and outpatient sleep evaluation.  DVT prophylaxis: Eliquis Code Status:   Code Status: Full Code Family Communication: plan of care discussed with patient at bedside. Patient status is: Inpatient because of need for dialysis Level of care: Telemetry Medical   Dispo: The patient is from: SNF            Anticipated disposition: SNF TBD Objective: Vitals last 24 hrs: Vitals:   06/26/22 0930 06/26/22 1000 06/26/22 1030 06/26/22 1100  BP: 119/72 111/65 124/72 112/69  Pulse: 98 98 100 (!) 101  Resp: (!) 21 (!) 21 16 18   Temp:      TempSrc:      SpO2: 98% 97% 100%   Weight:      Height:       Weight change: -2.1 kg  Physical Examination: General exam: AAox3, obese, chronically ill-appearing HEENT:Oral mucosa moist, Ear/Nose WNL grossly, dentition normal. Respiratory system: bilaterally diminished BSno use of accessory muscle Cardiovascular system: S1 & S2 +, regular rate. Gastrointestinal system: Abdomen soft, NT,ND,BS+ Nervous System:Alert, awake, moving extremities and grossly nonfocal Extremities: LE ankle edema 2+ diffuse , lower extremities warm Skin: No rashes,no icterus. MSK: Normal muscle bulk,tone, power  Tunneled HD catheter on right subclavian   Medications reviewed: Scheduled Meds:  allopurinol  50 mg Oral Once per day on Sun Wed   apixaban  5 mg Oral BID   atorvastatin  10 mg Oral Daily   carvedilol  12.5 mg Oral BID WC   Chlorhexidine Gluconate Cloth  6 each Topical Daily    Chlorhexidine Gluconate Cloth  6 each Topical Q0600   Chlorhexidine Gluconate Cloth  6 each Topical Q0600   donepezil  5 mg Oral QHS   febuxostat  40 mg Oral Daily   ferrous sulfate  325 mg Oral BID WC   folic acid  1 mg Oral Daily   guaiFENesin  600 mg Oral BID   heparin sodium (porcine)       insulin aspart  0-5 Units Subcutaneous QHS   insulin aspart  0-6 Units Subcutaneous TID WC   insulin aspart  4 Units Subcutaneous TID WC   insulin glargine-yfgn  10 Units Subcutaneous QHS   mirtazapine  30 mg Oral QHS   pantoprazole  40 mg Oral BID   potassium chloride  40 mEq Oral Once  Continuous Infusions:    Diet Order             Diet renal with fluid restriction Fluid restriction: 1200 mL Fluid; Room service appropriate? Yes; Fluid consistency: Thin  Diet effective now                  Intake/Output Summary (Last 24 hours) at 06/26/2022 1158 Last data filed at 06/26/2022 0800 Gross per 24 hour  Intake 120 ml  Output 800 ml  Net -680 ml   Net IO Since Admission: -5,938.64 mL [06/26/22 1158]  Wt Readings from Last 3 Encounters:  06/25/22 (!) 145.3 kg  06/04/22 130.5 kg  05/13/22 (!) 139.2  kg     Unresulted Labs (From admission, onward)     Start     Ordered   06/26/22 0500  CBC  Daily,   R      06/25/22 0819   06/25/22 0500  Basic metabolic panel  Daily,   R      06/24/22 1040          Data Reviewed: I have personally reviewed following labs and imaging studies CBC: Recent Labs  Lab 06/20/22 0541 06/21/22 0503 06/24/22 0516 06/25/22 0759 06/26/22 0316  WBC 12.1* 10.6* 12.2* 13.5* 13.2*  HGB 7.6* 8.2* 7.7* 7.4* 7.8*  HCT 24.1* 26.1* 24.7* 23.7* 23.1*  MCV 85.5 86.1 86.1 86.8 83.1  PLT 193 192 196 216 231   Basic Metabolic Panel: Recent Labs  Lab 06/22/22 0509 06/23/22 0505 06/24/22 0516 06/25/22 0759 06/26/22 0316  NA 131* 134* 136 138 133*  K 3.5 3.6 3.1* 3.3* 3.0*  CL 105 105 107 108 102  CO2 13* 16* 16* 14* 20*  GLUCOSE 269* 279* 176* 159*  195*  BUN 102* 109* 110* 103* 62*  CREATININE 5.04* 5.13* 5.21* 5.23* 4.14*  CALCIUM 8.0* 8.2* 8.3* 8.3* 8.1*  PHOS  --   --   --  8.1*  --    GFR: Estimated Creatinine Clearance: 21.9 mL/min (A) (by C-G formula based on SCr of 4.14 mg/dL (H)). Liver Function Tests: Recent Labs  Lab 06/25/22 0759  ALBUMIN 2.0*   No results for input(s): "LIPASE", "AMYLASE" in the last 168 hours. CBG: Recent Labs  Lab 06/25/22 0717 06/25/22 1235 06/25/22 1730 06/25/22 2231 06/26/22 0726  GLUCAP 149* 146* 310* 333* 136*   Recent Labs  Lab 06/21/22 0503  PROCALCITON 0.33    Recent Results (from the past 240 hour(s))  SARS Coronavirus 2 by RT PCR (hospital order, performed in [facility] hospital lab) *cepheid single result test* Anterior Nasal Swab     Status: None   Collection Time: 06/18/22  9:16 PM   Specimen: Anterior Nasal Swab  Result Value Ref Range Status   SARS Coronavirus 2 by RT PCR NEGATIVE NEGATIVE Final    Comment: (NOTE) SARS-CoV-2 target nucleic acids are NOT DETECTED.  The SARS-CoV-2 RNA is generally detectable in upper and lower respiratory specimens during the acute phase of infection. The lowest concentration of SARS-CoV-2 viral copies this assay can detect is 250 copies / mL. A negative result does not preclude SARS-CoV-2 infection and should not be used as the sole basis for treatment or other patient management decisions.  A negative result may occur with improper specimen collection / handling, submission of specimen other than nasopharyngeal swab, presence of viral mutation(s) within the areas targeted by this assay, and inadequate number of viral copies (<250 copies / mL). A negative result must be combined with clinical observations, patient history, and epidemiological information.  Fact Sheet for Patients:   RoadLapTop.co.za  Fact Sheet for Healthcare Providers: http://kim-miller.com/  This test is not yet  approved or  cleared by the [place] FDA and has been authorized for detection and/or diagnosis of SARS-CoV-2 by FDA under an Emergency Use Authorization (EUA).  This EUA will remain in effect (meaning this test can be used) for the duration of the COVID-19 declaration under Section 564(b)(1) of the Act, 21 U.S.C. section 360bbb-3(b)(1), unless the authorization is terminated or revoked sooner.  Performed at [facility], [address] [place], [place] [address]   Blood culture (routine x 2)  Status: None   Collection Time: 06/18/22 11:29 PM   Specimen: BLOOD  Result Value Ref Range Status   Specimen Description   Final    BLOOD BLOOD LEFT ARM Performed at [facility], [address] [name], [place] [address]    Special Requests   Final    BOTTLES DRAWN AEROBIC ONLY Blood Culture adequate volume Performed at [facility], [address], [place], [place] [address]    Culture   Final    NO GROWTH 5 DAYS Performed at [facility], [address] [place], [place] [address]    Report Status 06/24/2022 FINAL  Final  Blood culture (routine x 2)     Status: None   Collection Time: 06/18/22 11:34 PM   Specimen: BLOOD  Result Value Ref Range Status   Specimen Description   Final    BLOOD BLOOD LEFT ARM Performed at [facility], [address], [place], [place] [address]    Special Requests   Final    BOTTLES DRAWN AEROBIC AND ANAEROBIC Blood Culture adequate volume Performed at [facility], [address] [place], [place] [address]    Culture   Final    NO GROWTH 5 DAYS Performed at [facility], [address], [place], [place] [address]    Report Status 06/24/2022 FINAL  Final  Respiratory (~20 pathogens) panel by PCR     Status: Abnormal   Collection Time: 06/19/22  4:15 PM   Specimen: Nasopharyngeal Swab; Respiratory  Result Value Ref Range Status    Adenovirus NOT DETECTED NOT DETECTED Final   Coronavirus 229E NOT DETECTED NOT DETECTED Final    Comment: (NOTE) The Coronavirus on the Respiratory Panel, DOES NOT test for the novel  Coronavirus (2019 nCoV)    Coronavirus HKU1 NOT DETECTED NOT DETECTED Final   Coronavirus NL63 NOT DETECTED NOT DETECTED Final   Coronavirus OC43 NOT DETECTED NOT DETECTED Final   Metapneumovirus DETECTED (A) NOT DETECTED Final   Rhinovirus / Enterovirus NOT DETECTED NOT DETECTED Final   Influenza A NOT DETECTED NOT DETECTED Final   Influenza B NOT DETECTED NOT DETECTED Final   Parainfluenza Virus 1 NOT DETECTED NOT DETECTED Final   Parainfluenza Virus 2 NOT DETECTED NOT DETECTED Final   Parainfluenza Virus 3 NOT DETECTED NOT DETECTED Final   Parainfluenza Virus 4 NOT DETECTED NOT DETECTED Final   Respiratory Syncytial Virus NOT DETECTED NOT DETECTED Final   Bordetella pertussis NOT DETECTED NOT DETECTED Final   Bordetella Parapertussis NOT DETECTED NOT DETECTED Final   Chlamydophila pneumoniae NOT DETECTED NOT DETECTED Final   Mycoplasma pneumoniae NOT DETECTED NOT DETECTED Final    Comment: Performed at [facility], [address], [place], [place] [address]  MRSA Next Gen by PCR, Nasal     Status: None   Collection Time: 06/19/22  4:15 PM   Specimen: Nasal Mucosa; Nasal Swab  Result Value Ref Range Status   MRSA by PCR Next Gen NOT DETECTED NOT DETECTED Final    Comment: (NOTE) The GeneXpert MRSA Assay (FDA approved for NASAL specimens only), is one component of a comprehensive MRSA colonization surveillance program. It is not intended to diagnose MRSA infection nor to guide or monitor treatment for MRSA infections. Test performance is not FDA approved in patients less than 46 years old. Performed at [facility], [address], [place], [place] [address]     Antimicrobials: Anti-infectives (From admission,  onward)    Start     Dose/Rate Route Frequency Ordered Stop    06/24/22 1615  vancomycin (VANCOCIN) IVPB 1000 mg/200 mL premix        1,000 mg 200 mL/hr over 60 Minutes Intravenous  Once 06/24/22 1516 06/24/22 1729   06/21/22 1100  doxycycline (VIBRA-TABS) tablet 100 mg        100 mg Oral Every 12 hours 06/21/22 1002 06/26/22 0959   06/19/22 2300  ceFEPIme (MAXIPIME) 2 g in sodium chloride 0.9 % 100 mL IVPB  Status:  Discontinued        2 g 200 mL/hr over 30 Minutes Intravenous Every 24 hours 06/19/22 0156 06/21/22 1002   06/19/22 0200  vancomycin (VANCOREADY) IVPB 1500 mg/300 mL        1,500 mg 150 mL/hr over 120 Minutes Intravenous  Once 06/19/22 0156 06/19/22 0622   06/19/22 0157  vancomycin variable dose per unstable renal function (pharmacist dosing)  Status:  Discontinued         Does not apply See admin instructions 06/19/22 0157 06/20/22 1123   06/18/22 2330  ceFEPIme (MAXIPIME) 2 g in sodium chloride 0.9 % 100 mL IVPB        2 g 200 mL/hr over 30 Minutes Intravenous  Once 06/18/22 2317 06/19/22 0007   06/18/22 2315  vancomycin (VANCOCIN) IVPB 1000 mg/200 mL premix        1,000 mg 200 mL/hr over 60 Minutes Intravenous  Once 06/18/22 2306 06/19/22 0203      Culture/Microbiology    Component Value Date/Time   SDES  06/18/2022 2334    BLOOD BLOOD LEFT ARM Performed at [facility], [address] [place], [place] [address]    SPECREQUEST  06/18/2022 2334    BOTTLES DRAWN AEROBIC AND ANAEROBIC Blood Culture adequate volume Performed at [facility], [address], [place], [place] [address]    CULT  06/18/2022 2334    NO GROWTH 5 DAYS Performed at [facility], [address] [place], [place] [address]    REPTSTATUS 06/24/2022 FINAL 06/18/2022 2334  Other culture-see note  Radiology Studies: VAS [place] UPPER EXT VEIN MAPPING (PRE-OP AVF)  Result Date: 06/25/2022 UPPER EXTREMITY VEIN MAPPING Patient Name:  CHERRYL BABIN  Date of Exam:   06/25/2022 Medical Rec #: [id number]              Accession #:    [id number] Date of Birth: Oct 24, 1965             Patient Gender: F Patient Age:   42 years Exam Location:  [facility] Procedure:      VAS [place] UPPER EXT VEIN MAPPING (PRE-OP AVF) Referring Phys: Heath Lark --------------------------------------------------------------------------------  Indications: Pre-access. Limitations: Patient positioning and small diameter of vessels. Comparison Study: No previous study Performing Technologist: McKayla Maag RVT, [place]  Examination Guidelines: A complete evaluation includes B-mode imaging, spectral Doppler, color Doppler, and power Doppler as needed of all accessible portions of each vessel. Bilateral testing is considered an integral part of a complete examination. Limited examinations for reoccurring indications may be performed as noted. +-----------------+-------------+----------+--------------+ Right Cephalic   Diameter (cm)Depth (cm)   Findings    +-----------------+-------------+----------+--------------+ Shoulder             0.19        1.34                  +-----------------+-------------+----------+--------------+ Prox upper arm  0.27        1.62                  +-----------------+-------------+----------+--------------+ Mid upper arm        0.33        0.99                  +-----------------+-------------+----------+--------------+ Dist upper arm       0.25        0.58                  +-----------------+-------------+----------+--------------+ Antecubital fossa    0.37        0.39                  +-----------------+-------------+----------+--------------+ Prox forearm         0.07        0.44                  +-----------------+-------------+----------+--------------+ Mid forearm                             not visualized +-----------------+-------------+----------+--------------+ Dist forearm                            not visualized  +-----------------+-------------+----------+--------------+ Wrist                                   not visualized +-----------------+-------------+----------+--------------+ +-----------------+-------------+----------+--------------+ Right Basilic    Diameter (cm)Depth (cm)   Findings    +-----------------+-------------+----------+--------------+ Shoulder                                not visualized +-----------------+-------------+----------+--------------+ Prox upper arm                          not visualized +-----------------+-------------+----------+--------------+ Mid upper arm                           not visualized +-----------------+-------------+----------+--------------+ Dist upper arm                          not visualized +-----------------+-------------+----------+--------------+ Antecubital fossa    0.07        0.52                  +-----------------+-------------+----------+--------------+ Prox forearm         0.11        0.41                  +-----------------+-------------+----------+--------------+ Mid forearm                             not visualized +-----------------+-------------+----------+--------------+ Distal forearm                          not visualized +-----------------+-------------+----------+--------------+ Elbow                                   not visualized +-----------------+-------------+----------+--------------+ Wrist  not visualized +-----------------+-------------+----------+--------------+ +-----------------+-------------+----------+---------+ Left Cephalic    Diameter (cm)Depth (cm)Findings  +-----------------+-------------+----------+---------+ Shoulder             0.44        2.10             +-----------------+-------------+----------+---------+ Prox upper arm       0.36        1.82             +-----------------+-------------+----------+---------+ Mid  upper arm        0.36        1.36             +-----------------+-------------+----------+---------+ Dist upper arm       0.34        1.11   branching +-----------------+-------------+----------+---------+ Antecubital fossa    0.37        0.81             +-----------------+-------------+----------+---------+ Prox forearm                            thrombus  +-----------------+-------------+----------+---------+ Mid forearm                             thrombus  +-----------------+-------------+----------+---------+ Dist forearm                            thrombus  +-----------------+-------------+----------+---------+ +-----------------+-------------+----------+--------------+ Left Basilic     Diameter (cm)Depth (cm)   Findings    +-----------------+-------------+----------+--------------+ Shoulder                                not visualized +-----------------+-------------+----------+--------------+ Prox upper arm                          not visualized +-----------------+-------------+----------+--------------+ Mid upper arm                           not visualized +-----------------+-------------+----------+--------------+ Dist upper arm                          not visualized +-----------------+-------------+----------+--------------+ Antecubital fossa                       not visualized +-----------------+-------------+----------+--------------+ Prox forearm         0.18        0.37                  +-----------------+-------------+----------+--------------+ Mid forearm          0.15        0.19                  +-----------------+-------------+----------+--------------+ Distal forearm       0.11        0.29                  +-----------------+-------------+----------+--------------+ Wrist                                   not visualized +-----------------+-------------+----------+--------------+ Acute superficial thrombus  formation in the left cephalic vein proximal forearm to distal forearm. *See table(s) above for measurements  and observations.  Diagnosing physician: Sherald Hess MD Electronically signed by Sherald Hess MD on 06/25/2022 at 4:19:26 PM.    Final    IR Fluoro Guide CV Line Right  Result Date: 06/24/2022 INDICATION: 57 year old female referred for tunneled hemodialysis catheter EXAM: TUNNELED CENTRAL VENOUS HEMODIALYSIS CATHETER PLACEMENT WITH ULTRASOUND AND FLUOROSCOPIC GUIDANCE MEDICATIONS: 1 g vancomycin. The antibiotic was given in an appropriate time interval prior to skin puncture. ANESTHESIA/SEDATION: Moderate (conscious) sedation was not employed during this procedure. A total of Versed 0.5 mg and Fentanyl 0 mcg was administered intravenously by the radiology nurse. Total intra-service moderate Sedation Time: 0 minutes. The patient's level of consciousness and vital signs were monitored continuously by radiology nursing throughout the procedure under my direct supervision. FLUOROSCOPY: Radiation Exposure Index (as provided by the fluoroscopic device): 6 mGy Kerma COMPLICATIONS: None PROCEDURE: Informed written consent was obtained from the patient after a discussion of the risks, benefits, and alternatives to treatment. Questions regarding the procedure were encouraged and answered. The right neck and chest were prepped with chlorhexidine in a sterile fashion, and a sterile drape was applied covering the operative field. Maximum barrier sterile technique with sterile gowns and gloves were used for the procedure. A timeout was performed prior to the initiation of the procedure. Ultrasound survey was performed. The right internal jugular vein was confirmed to be patent, with images stored and sent to PACS. Micropuncture kit was utilized to access the right internal jugular vein under direct, real-time ultrasound guidance after the overlying soft tissues were anesthetized with 1% lidocaine with  epinephrine. Stab incision was made with 11 blade scalpel. Microwire was passed centrally. The microwire was then marked to measure appropriate internal catheter length. External tunneled length was estimated. A total tip to cuff length of 23 cm was selected. 035 guidewire was advanced to the level of the IVC. Skin and subcutaneous tissues of chest wall below the clavicle were generously infiltrated with 1% lidocaine for local anesthesia. A small stab incision was made with 11 blade scalpel. The selected hemodialysis catheter was tunneled in a retrograde fashion from the anterior chest wall to the venotomy incision. Serial dilation was performed and then a peel-away sheath was placed. The catheter was then placed through the peel-away sheath with tips ultimately positioned within the superior aspect of the right atrium. Final catheter positioning was confirmed and documented with a spot radiographic image. The catheter aspirates and flushes normally. The catheter was flushed with appropriate volume heparin dwells. The catheter exit site was secured with a 0-Prolene retention suture. Gel-Foam slurry was infused into the soft tissue tract. The venotomy incision was closed Derma bond and sterile dressing. Dressings were applied at the chest wall. Patient tolerated the procedure well and remained hemodynamically stable throughout. No complications were encountered and no significant blood loss encountered. IMPRESSION: Status post image guided right IJ tunneled hemodialysis catheter. Signed, Yvone Neu. Miachel Roux, RPVI [facility] Electronically Signed   By: Gilmer Mor D.O.   On: 06/24/2022 17:23   IR US Guide Vasc Access Right  Result Date: 06/24/2022 INDICATION: 57 year old female referred for tunneled hemodialysis catheter EXAM: TUNNELED CENTRAL VENOUS HEMODIALYSIS CATHETER PLACEMENT WITH ULTRASOUND AND FLUOROSCOPIC GUIDANCE MEDICATIONS: 1 g  vancomycin. The antibiotic was given in an appropriate time interval prior to skin puncture. ANESTHESIA/SEDATION: Moderate (conscious) sedation was not employed during this procedure. A total of Versed 0.5 mg and Fentanyl 0 mcg was administered intravenously by the radiology nurse. Total intra-service moderate  Sedation Time: 0 minutes. The patient's level of consciousness and vital signs were monitored continuously by radiology nursing throughout the procedure under my direct supervision. FLUOROSCOPY: Radiation Exposure Index (as provided by the fluoroscopic device): 6 mGy Kerma COMPLICATIONS: None PROCEDURE: Informed written consent was obtained from the patient after a discussion of the risks, benefits, and alternatives to treatment. Questions regarding the procedure were encouraged and answered. The right neck and chest were prepped with chlorhexidine in a sterile fashion, and a sterile drape was applied covering the operative field. Maximum barrier sterile technique with sterile gowns and gloves were used for the procedure. A timeout was performed prior to the initiation of the procedure. Ultrasound survey was performed. The right internal jugular vein was confirmed to be patent, with images stored and sent to PACS. Micropuncture kit was utilized to access the right internal jugular vein under direct, real-time ultrasound guidance after the overlying soft tissues were anesthetized with 1% lidocaine with epinephrine. Stab incision was made with 11 blade scalpel. Microwire was passed centrally. The microwire was then marked to measure appropriate internal catheter length. External tunneled length was estimated. A total tip to cuff length of 23 cm was selected. 035 guidewire was advanced to the level of the IVC. Skin and subcutaneous tissues of chest wall below the clavicle were generously infiltrated with 1% lidocaine for local anesthesia. A small stab incision was made with 11 blade scalpel. The selected  hemodialysis catheter was tunneled in a retrograde fashion from the anterior chest wall to the venotomy incision. Serial dilation was performed and then a peel-away sheath was placed. The catheter was then placed through the peel-away sheath with tips ultimately positioned within the superior aspect of the right atrium. Final catheter positioning was confirmed and documented with a spot radiographic image. The catheter aspirates and flushes normally. The catheter was flushed with appropriate volume heparin dwells. The catheter exit site was secured with a 0-Prolene retention suture. Gel-Foam slurry was infused into the soft tissue tract. The venotomy incision was closed Derma bond and sterile dressing. Dressings were applied at the chest wall. Patient tolerated the procedure well and remained hemodynamically stable throughout. No complications were encountered and no significant blood loss encountered. IMPRESSION: Status post image guided right IJ tunneled hemodialysis catheter. Signed, Yvone Neu. Miachel Roux, RPVI [facility] Radiology Electronically Signed   By: Gilmer Mor D.O.   On: 06/24/2022 17:23    LOS: 7 days  Lanae Boast, MD [facility]  06/26/2022, 11:58 AM

## 2022-06-26 NOTE — Procedures (Signed)
HD Note:  Some information was entered later than the data was gathered due to patient care needs. The stated time with the data is accurate.  Received patient in bed to unit.  Alert and oriented.  Informed consent signed and in chart.   TX duration: 3.5 hours  Patient tolerated well. Did have to turn off UF for stomach cramping.  See flowsheet. Transported back to the room  Alert, without acute distress.  Hand-off given to patient's nurse.   Access used: Right upper chest HD catheter Access issues: did reverse lines due to high arterial pressure, no further issues  Total UF removed: 2500 ml   [facility]

## 2022-06-26 NOTE — Progress Notes (Signed)
Admit: 06/18/2022 LOS: 7  [name] AoCKD4, multifocal pneumonia likley 2/2 metapneumovirus, recent admission for disseminated gout with encephalopathy and AKI, just discharged from SNF.   Subjective: TDC placed yesterday, on HD again this am, 2nd sessions. Is upset about the kidney diet, can't eat her usual limited things she has at home   Physical Exam:  Blood pressure 114/78, pulse 100, temperature 98.2 F (36.8 C), temperature source Oral, resp. rate 18, height  (1.676 m), weight (!) 145.3 kg, last menstrual period 10/10/2012, SpO2 97 %. GEN: Chronically ill-appearing, morbidly obese female, up at side of bed ENT: NCAT EYES: EOMI CV: Regular, tachycardic, no rub, normal S1 and S2 PULM: Diminished in the bases, distant throughout ABD: benign, nontender EXT: diffuse 2-3+ LE edema is now down to 2+ edema, improving    Home meds - zyloprim, albuterol, norvasc 10, eliquis, lipitor, coreg 12.5 bid, colchicine, aricept, pepcid, hydralazine 50 tid, insulin aspart/ glargine/ lispro, remeron, mVI, sod bicarb, torsemide 20 qd, uloric, protonix, prns    Date   Creat  eGFR  2018-2020  1.01- 1.39   2021   1.31- 2.70  2022   1.64- 4.38  2023   1.72- 6.25 Mar 2022  3.02- 5.93 8- 17 ml/min  March 2024  1.92- 4.59 10-30 ml/min  4/12   4.84  10 ml/min    4/13   4.82  4/14   4.83  06/20/21  4.99  9 ml/min     B/l creat 2.1- 3.1 from march 2023, eGFR 17- 28 ml/ min   UA 11-20 wbc, 5-10 rbc from 06/19/22, prot 30   CT abd renal --> normal appearing kidneys w/o obstruction    Assessment/ Plan AoCKD4: b/l creat 2.1- 3.1 from march 2023, eGFR 17-28 ml/min. Creat here 4.8 on admission in setting of MPV resp infection and lower ext edema. Followed by Dr Thedore Mins at [facility]. UA unremarkable, CT abd w/o obstruction. Lasix started at 40 mg bid IV and now is up to  IV tid. UOP is 1500- 2000 per day, no sig change in edema and no improvement in weights compared to admit 6 days ago. On the phone yesterday,  her brother (esrd on HD) says her QOL is "horrible" and has been for the last couple of years due to her chronic fluid overload issues and he doesn't see her getting better until she gets dialysis. Have d/w the patient, she acknowledges very difficult times at home w/ her chronic fluid overload. She is not getting better here despite high dose lasix --> we should start her on dialysis and the pt agreed. TDC placed 4/18 by IR. 1st HD yest and 2nd HD today.  Nutrition - pt has very limited diet which she says she can't replicate within the renal diet, will change to regular diet pending dietician assistance.  HD access - appreciate VVS consult, plan is for AVF vs AVG on Monday.  Metapneumovirus - w/ multifocal pneumonia R > L, sp IV vanc + cefepime, BCx NGTD. Is now on po doxy. Improving.  Lower extremity edema - bilat diffuse LE"s which is improving now w/ 3-4 L UF per HD Hypertension - on amlodipine, hydralazine, carvedilol Gout - with severe hyperuricemia on prednisone colchicine and allopurinol Anemia -  worsening, transfuse per TR[facility] History of HFpEF, normal LVEF DM2 with history of HHS Atrial fibrillation - on rate control and apixaban Lumbar spinal stenosis. Chronic pain on narcotics   Vinson Moselle, MD 06/26/2022, 4:21 PM  Recent Labs  Lab  06/25/22 0759 06/26/22 0316  HGB 7.4* 7.8*  ALBUMIN 2.0*  --   CALCIUM 8.3* 8.1*  PHOS 8.1*  --   CREATININE 5.23* 4.14*  K 3.3* 3.0*     Inpatient medications:  allopurinol  50 mg Oral Once per day on Sun Wed   apixaban  5 mg Oral BID   atorvastatin  10 mg Oral Daily   carvedilol  12.5 mg Oral BID WC   Chlorhexidine Gluconate Cloth  6 each Topical Daily   Chlorhexidine Gluconate Cloth  6 each Topical Q0600   Chlorhexidine Gluconate Cloth  6 each Topical Q0600   donepezil  5 mg Oral QHS   febuxostat  40 mg Oral Daily   ferrous sulfate  325 mg Oral BID WC   folic acid  1 mg Oral Daily   guaiFENesin  600 mg Oral BID   insulin aspart  0-5  Units Subcutaneous QHS   insulin aspart  0-6 Units Subcutaneous TID WC   insulin aspart  4 Units Subcutaneous TID WC   insulin glargine-yfgn  10 Units Subcutaneous QHS   mirtazapine  30 mg Oral QHS   pantoprazole  40 mg Oral BID     albuterol, alum & mag hydroxide-simeth, guaiFENesin, HYDROmorphone, loperamide, ondansetron **OR** ondansetron (ZOFRAN) IV, mouth rinse

## 2022-06-27 DIAGNOSIS — J189 Pneumonia, unspecified organism: Secondary | ICD-10-CM | POA: Diagnosis not present

## 2022-06-27 LAB — CBC
HCT: 23.9 % — ABNORMAL LOW (ref 36.0–46.0)
Hemoglobin: 7.5 g/dL — ABNORMAL LOW (ref 12.0–15.0)
MCH: 27.2 pg (ref 26.0–34.0)
MCHC: 31.4 g/dL (ref 30.0–36.0)
MCV: 86.6 fL (ref 80.0–100.0)
Platelets: 220 10*3/uL (ref 150–400)
RBC: 2.76 MIL/uL — ABNORMAL LOW (ref 3.87–5.11)
RDW: 20.3 % — ABNORMAL HIGH (ref 11.5–15.5)
WBC: 13.3 10*3/uL — ABNORMAL HIGH (ref 4.0–10.5)
nRBC: 0 % (ref 0.0–0.2)

## 2022-06-27 LAB — BASIC METABOLIC PANEL
Anion gap: 13 (ref 5–15)
BUN: 36 mg/dL — ABNORMAL HIGH (ref 6–20)
CO2: 24 mmol/L (ref 22–32)
Calcium: 8.5 mg/dL — ABNORMAL LOW (ref 8.9–10.3)
Chloride: 97 mmol/L — ABNORMAL LOW (ref 98–111)
Creatinine, Ser: 3.85 mg/dL — ABNORMAL HIGH (ref 0.44–1.00)
GFR, Estimated: 13 mL/min — ABNORMAL LOW (ref >60)
Glucose, Bld: 202 mg/dL — ABNORMAL HIGH (ref 70–99)
Potassium: 3.2 mmol/L — ABNORMAL LOW (ref 3.5–5.1)
Sodium: 134 mmol/L — ABNORMAL LOW (ref 135–145)

## 2022-06-27 LAB — GLUCOSE, CAPILLARY
Glucose-Capillary: 152 mg/dL — ABNORMAL HIGH (ref 70–99)
Glucose-Capillary: 208 mg/dL — ABNORMAL HIGH (ref 70–99)
Glucose-Capillary: 300 mg/dL — ABNORMAL HIGH (ref 70–99)
Glucose-Capillary: 251 mg/dL — ABNORMAL HIGH (ref 70–99)

## 2022-06-27 LAB — APTT: aPTT: 31 seconds (ref 24–36)

## 2022-06-27 LAB — HEPARIN LEVEL (UNFRACTIONATED): Heparin Unfractionated: >1.1 IU/mL — ABNORMAL HIGH (ref 0.30–0.70)

## 2022-06-27 MED ORDER — CHLORHEXIDINE GLUCONATE CLOTH 2 % EX PADS
6.0000 | MEDICATED_PAD | Freq: Every day | CUTANEOUS | Status: DC
Start: 2022-06-28 — End: 2022-06-27

## 2022-06-27 MED ORDER — HEPARIN (PORCINE) 25000 UT/250ML-% IV SOLN
1600.0000 [IU]/h | INTRAVENOUS | Status: DC
  Administered 2022-06-27 – 2022-06-28 (×3): 1400 [IU]/h via INTRAVENOUS
  Administered 2022-06-29 – 2022-06-30 (×2): 1600 [IU]/h via INTRAVENOUS
  Filled 2022-06-27 (×4): qty 250

## 2022-06-27 MED ORDER — VANCOMYCIN HCL IN DEXTROSE 1-5 GM/200ML-% IV SOLN: 1000.0000 mg | INTRAVENOUS | Status: DC

## 2022-06-27 NOTE — Progress Notes (Signed)
While placing preop orders noted patient has been receiving her Eliquis including this morning.  Surgery will be canceled tomorrow.  I rescheduled her for Wednesday with me in the OR.  Discussed with hospitalist.  Cephus Shelling, MD [facility] [place] Office: [phone]   Cephus Shelling

## 2022-06-27 NOTE — Progress Notes (Addendum)
Admit: 06/18/2022 LOS: 8  65F AoCKD4, multifocal pneumonia likley 2/2 metapneumovirus, recent admission for disseminated gout with encephalopathy and AKI, just discharged from SNF.   Subjective: had 2nd HD yesterday w/o incident. Wts are coming down.   Physical Exam:  Blood pressure 131/71, pulse 98, temperature 98.2 F (36.8 C), resp. rate (!) 21, height  (1.676 m), weight (!) 143.8 kg, last menstrual period 10/10/2012, SpO2 98 %. GEN: Chronically ill-appearing, morbidly obese female, NAD ENT: NCAT EYES: EOMI CV: Regular, tachycardic, no rub, normal S1 and S2 PULM: Diminished in the bases, distant throughout ABD: benign, nontender EXT: diffuse 2+ bilat LE edema is improving    Home meds - zyloprim, albuterol, norvasc 10, eliquis, lipitor, coreg 12.5 bid, colchicine, aricept, pepcid, hydralazine 50 tid, insulin aspart/ glargine/ lispro, remeron, mVI, sod bicarb, torsemide 20 qd, uloric, protonix, prns    Date   Creat  eGFR  2018-2020  1.01- 1.39   2021   1.31- 2.70  2022   1.64- 4.38  2023   1.72- 6.25 Mar 2022  3.02- 5.93 8- 17 ml/min  March 2024  1.92- 4.59 10-30 ml/min  4/12   4.84  10 ml/min    4/13   4.82  4/14   4.83  06/20/21  4.99  9 ml/min     B/l creat 2.1- 3.1 from march 2023, eGFR 17- 28 ml/ min   UA 11-20 wbc, 5-10 rbc from 06/19/22, prot 30   CT abd renal --> normal appearing kidneys w/o obstruction    Assessment/ Plan AoCKD4: b/l creat 2.1- 3.1 from march 2023, eGFR 17-28 ml/min. Creat here 4.8 on admission in setting of MPV resp infection and lower ext edema. Followed by Dr Thedore Mins at [facility] UA unremarkable, CT abd w/o obstruction. Lasix started at 40 mg bid IV then titrated up to  IV tid but no sig improvement in wt's or edema since admission. Pt's brother (esrd on HD) called and told me that her QOL is "horrible" and has been for the last couple of years due to her chronic fluid overload issues and he doesn't see her getting better. He felt she would do  better on dialysis. I d/w the patient and she acknowledged very difficult times lately due to chronic fluid overload. She was not getting better here despite high dose lasix so decided to go ahead and start dialysis. TDC placed by IR and pt had her 1st HD friday and 2nd HD Saturday. VVS is planning a permanent access soon. Next HD tomorrow. Start CLIP process Monday.  Nutrition - pt has very limited diet and requested change to regular diet  HD access - [facility]VS consulting, plan is for new access this week.  Metapneumovirus - admit diagnosis, w/ multifocal pneumonia R > L, sp IV vanc + cefepime, BCx NGTD. Is now on po doxy.  Lower extremity edema - bilat diffuse LE"s which is improving now w/ dialysis Hypertension - on amlodipine, hydralazine, carvedilol Gout - with severe hyperuricemia on prednisone colchicine and allopurinol Anemia -  worsening, transfuse per TRH History of HFpEF, normal LVEF DM2 with history of HHS Atrial fibrillation - on rate control and apixaban Lumbar spinal stenosis. Chronic pain on narcotics   Vinson Moselle, MD 06/27/2022, 7:00 PM  Recent Labs  Lab 06/25/22 0759 06/26/22 0316 06/27/22 0225  HGB 7.4* 7.8* 7.5*  ALBUMIN 2.0*  --   --   CALCIUM 8.3* 8.1* 8.5*  PHOS 8.1*  --   --   CREATININE 5.23* 4.14*  3.85*  K 3.3* 3.0* 3.2*     Inpatient medications:  allopurinol  50 mg Oral Once per day on Sun Wed   atorvastatin  10 mg Oral Daily   carvedilol  12.5 mg Oral BID WC   Chlorhexidine Gluconate Cloth  6 each Topical Daily   Chlorhexidine Gluconate Cloth  6 each Topical Q0600   Chlorhexidine Gluconate Cloth  6 each Topical Q0600   donepezil  5 mg Oral QHS   febuxostat  40 mg Oral Daily   ferrous sulfate  325 mg Oral BID WC   folic acid  1 mg Oral Daily   guaiFENesin  600 mg Oral BID   insulin aspart  0-5 Units Subcutaneous QHS   insulin aspart  0-6 Units Subcutaneous TID WC   insulin aspart  4 Units Subcutaneous TID WC   insulin glargine-yfgn  10 Units  Subcutaneous QHS   mirtazapine  30 mg Oral QHS   pantoprazole  40 mg Oral BID    heparin      albuterol, alum & mag hydroxide-simeth, guaiFENesin, HYDROmorphone, loperamide, ondansetron **OR** ondansetron (ZOFRAN) IV, mouth rinse

## 2022-06-27 NOTE — Progress Notes (Signed)
PROGRESS NOTE Deborah [name]  ZOX:[id number] DOB: [birth year] DOA: 06/18/2022 PCP: Elsie Amis, MD  Brief Narrative/Hospital Course: 57 y.o.f w/ medical history significant for insulin-dependent diabetes mellitus, hypertension, PAF on Eliquis, CKD stage IV, iron deficiency anemia, chronic pain, duodenal carcinoid tumor resected in 2021 and recent admission 05/28/22 -06/04/22 with diagnosis of acute metabolic encephalopathy due to dehydration with associated AKI on CKDIV, with course complicated by disseminated gout and discharged on colchine, Uloric  and allopurinol as well as steroid taper to SNF 57 presented with worsening shortness of breath, fluid overload past 24 hours In the WJ:XBJYNW has revealed multiple opacities on chest x-ray, COVID-19 is negative and respiratory viral panel is positive for metapneumovirus infection.  Placed on empiric antibiotics, seen by nephrology for fluid overload and placed on high-dose Lasix without improvement. Subsequently transferred to [facility] 4/18 for dialysis and access Underwent right IJ tunneled HD catheter placement by IR 4/18 Seen by Dr. Lenell Antu from vascular for HD access advised supportive left upper extremity from further venipuncture and awaiting vein mapping   Subjective: Seen and examined this morning she has been upset about her diet requesting regular diet No other new complaints Overnight patient has been afebrile, BP stable, on room air Labs this morning potassium 3.2 mild leukocytosis hemoglobin 7.5 about the same Wt   155.6kg/ 343 lb  on 4/17> 317 lb/143kg 4/20 ; down 26 lb   Assessment and Plan: Principal Problem:   HCAP (healthcare-associated pneumonia)  Metapneumovirus multifocal pneumonia right more than left: Initially received vancomycin and cefepime blood culture no growth to date transitioned to doxycycline and completed. Continue to provide respiratory support withOOB, IS  bronchodilators as needed.  Has mild  leukocytosis ongoing but afebrile- trend.  Acute on chronic kidney disease stage IV Bilateral lower extremity edema: B/L creat 2.1-3.1 from March 2023>4.8 creatinine on admission.  AKI in the setting of metapneumovirus respiratory infection and with fluid overload.UA unremarkable CT abdomen without obstruction, placed on high-dose Lasix without improvement in leg edema despite good urine output >subsequently s/p tunneled HD placement and dialysis initiated 4/19.  Continue HD per nephrology,  VVS following for access> Dr. Lewanda Rife advised to hold Eliquis today planning for fistula on Wednesday will switch to heparin gtt.  Monitor avoid net output as below has had weight improvement about 26 pounds She is on renal diet but she is not very happy about the diet she has been consulted  >Avoid nephrotoxic medications including NSAIDs and iodinated intravenous contrast exposure unless the latter is absolutely indicated.Preferred narcotic agents for pain control GNF:AOZHYQMVHQION, fentanyl, and methadone and avoid morphine.Avoid Baclofen and avoid oral sodium phosphate and magnesium citrate based laxatives / bowel preps. Continue strict Input and Output monitoring and serial renal functions.  Net IO Since Admission: -S913356.64 mL [06/27/22 1022]  Filed Weights   06/25/22 0752 06/25/22 1110 06/26/22 2002  Weight: (!) 148.8 kg (!) 145.3 kg (!) 143.8 kg   Hypokalemia: Replace/adjust w/ HD per nephrology Metabolic acidosis in the setting of CKD and AKI monitor. Hco3 better at 20.   Hypertension Chronic CHF with preserved EF: Fluid management  per HD see above.Continue Coreg, blood pressure control  Atrial fibrillation rate controlled on Coreg, changed Eliquis> heparin drip 4/21 preop  Anemia multifactorial from IDA and CKD: Hemoglobin remains stable -Cont iron supplement, folate monitor> trend hb and transfuse if less than 7 g.  Pt had 2 u prbc done on 4/13. Recent Labs  Lab 06/21/22 0503 06/24/22 6295  06/25/22 0759 06/26/22 0316  06/27/22 0225  HGB 8.2* 7.7* 7.4* 7.8* 7.5*  HCT 26.1* 24.7* 23.7* 23.1* 23.9*    Hyperlipidemia continue statin  Chronic pain Left-sided abdominal pain Lumbar stenosis: Underwent CT renal study without obvious acute finding, has diverticulosis.  LFTs are stable.  Lipase mildly elevated but in the setting of CKD AKI.  Cont pain management and stool softener.    Hyperuricemia/gout with recent flare of and disseminated into hyperuricemia and encephalopathy: Uric acid now down to 8, was 16.5 at previous admission.  Continue colchicine, allopurinol. completed prednisone taper previously. Continue on colchicine and uloric.  Type 2 diabetes mellitus history of HHS: HbA1c poorly controlled 8.1 blood sugar f is poorly controlled, increased Semglee to 10 units 4/20, continue Premeal and SSI  and trend Recent Labs  Lab 06/26/22 0726 06/26/22 1239 06/26/22 1634 06/26/22 2112 06/27/22 0737  GLUCAP 136* 136* 222* 224* 152*    Morbid Obesity:Patient's Body mass index is 51.17 kg/m. : Will benefit with PCP follow-up, weight loss  healthy lifestyle and outpatient sleep evaluation.  DVT prophylaxis: Eliquis Code Status:   Code Status: Full Code Family Communication: plan of care discussed with patient at bedside. Patient status is: Inpatient because of need for dialysis Level of care: Telemetry Medical   Dispo: The patient is from: SNF            Anticipated disposition: SNF TBD Objective: Vitals last 24 hrs: Vitals:   06/26/22 1634 06/26/22 2002 06/26/22 2114 06/27/22 0551  BP: 107/69  121/79 132/73  Pulse: (!) 107  99 98  Resp: 20  20   Temp: 98.3 F (36.8 C)  98.1 F (36.7 C) 97.8 F (36.6 C)  TempSrc: Oral  Oral Oral  SpO2: 100%  100% 100%  Weight:  (!) 143.8 kg    Height:       Weight change: -5 kg  Physical Examination: General exam: AAoc3, weak,older appearing HEENT:Oral mucosa moist, Ear/Nose WNL grossly, dentition normal. Respiratory  system: bilaterally clear BS, no use of accessory muscle Cardiovascular system: S1 & S2 +, regular rate. Gastrointestinal system: Abdomen soft, obese, NT,ND,BS+ Nervous System:Alert, awake, moving extremities and grossly nonfocal Extremities: LE ankle edema present, lower extremities warm Skin: No rashes,no icterus. MSK: Normal muscle bulk,tone, power  HD catheter+ rt ij  Medications reviewed: Scheduled Meds:  allopurinol  50 mg Oral Once per day on Sun Wed   apixaban  5 mg Oral BID   atorvastatin  10 mg Oral Daily   carvedilol  12.5 mg Oral BID WC   Chlorhexidine Gluconate Cloth  6 each Topical Daily   Chlorhexidine Gluconate Cloth  6 each Topical Q0600   Chlorhexidine Gluconate Cloth  6 each Topical Q0600   donepezil  5 mg Oral QHS   febuxostat  40 mg Oral Daily   ferrous sulfate  325 mg Oral BID WC   folic acid  1 mg Oral Daily   guaiFENesin  600 mg Oral BID   insulin aspart  0-5 Units Subcutaneous QHS   insulin aspart  0-6 Units Subcutaneous TID WC   insulin aspart  4 Units Subcutaneous TID WC   insulin glargine-yfgn  10 Units Subcutaneous QHS   mirtazapine  30 mg Oral QHS   pantoprazole  40 mg Oral BID  Continuous Infusions:  [START ON 06/28/2022] vancomycin       Diet Order             Diet NPO time specified  Diet effective midnight  Diet renal with fluid restriction Fluid restriction: 1200 mL Fluid; Room service appropriate? Yes; Fluid consistency: Thin  Diet effective now                  Intake/Output Summary (Last 24 hours) at 06/27/2022 1022 Last data filed at 06/27/2022 0800 Gross per 24 hour  Intake 300 ml  Output 2800 ml  Net -2500 ml   Net IO Since Admission: -8,295.64 mL [06/27/22 1022]  Wt Readings from Last 3 Encounters:  06/26/22 (!) 143.8 kg  06/04/22 130.5 kg  05/13/22 (!) 139.2 kg     Unresulted Labs (From admission, onward)     Start     Ordered   06/28/22 0500  Basic metabolic panel  Tomorrow morning,   R       Question:   Specimen collection method  Answer:  Lab=Lab collect   06/27/22 1002   06/26/22 0500  CBC  Daily,   R      06/25/22 0819          Data Reviewed: I have personally reviewed following labs and imaging studies CBC: Recent Labs  Lab 06/21/22 0503 06/24/22 0516 06/25/22 0759 06/26/22 0316 06/27/22 0225  WBC 10.6* 12.2* 13.5* 13.2* 13.3*  HGB 8.2* 7.7* 7.4* 7.8* 7.5*  HCT 26.1* 24.7* 23.7* 23.1* 23.9*  MCV 86.1 86.1 86.8 83.1 86.6  PLT 192 196 216 231 220   Basic Metabolic Panel: Recent Labs  Lab 06/23/22 0505 06/24/22 0516 06/25/22 0759 06/26/22 0316 06/27/22 0225  NA 134* 136 138 133* 134*  K 3.6 3.1* 3.3* 3.0* 3.2*  CL 105 107 108 102 97*  CO2 16* 16* 14* 20* 24  GLUCOSE 279* 176* 159* 195* 202*  BUN 109* 110* 103* 62* 36*  CREATININE 5.13* 5.21* 5.23* 4.14* 3.85*  CALCIUM 8.2* 8.3* 8.3* 8.1* 8.5*  PHOS  --   --  8.1*  --   --    GFR: Estimated Creatinine Clearance: 23.4 mL/min (A) (by C-G formula based on SCr of 3.85 mg/dL (H)). Liver Function Tests: Recent Labs  Lab 06/25/22 0759  ALBUMIN 2.0*   No results for input(s): "LIPASE", "AMYLASE" in the last 168 hours. CBG: Recent Labs  Lab 06/26/22 0726 06/26/22 1239 06/26/22 1634 06/26/22 2112 06/27/22 0737  GLUCAP 136* 136* 222* 224* 152*   Recent Labs  Lab 06/21/22 0503  PROCALCITON 0.33    Recent Results (from the past 240 hour(s))  SARS Coronavirus 2 by RT PCR (hospital order, performed in [facility] hospital lab) *cepheid single result test* Anterior Nasal Swab     Status: None   Collection Time: 06/18/22  9:16 PM   Specimen: Anterior Nasal Swab  Result Value Ref Range Status   SARS Coronavirus 2 by RT PCR NEGATIVE NEGATIVE Final    Comment: (NOTE) SARS-CoV-2 target nucleic acids are NOT DETECTED.  The SARS-CoV-2 RNA is generally detectable in upper and lower respiratory specimens during the acute phase of infection. The lowest concentration of SARS-CoV-2 viral copies this assay can detect  is 250 copies / mL. A negative result does not preclude SARS-CoV-2 infection and should not be used as the sole basis for treatment or other patient management decisions.  A negative result may occur with improper specimen collection / handling, submission of specimen other than nasopharyngeal swab, presence of viral mutation(s) within the areas targeted by this assay, and inadequate number of viral copies (<250 copies / mL). A negative result must be combined with clinical observations, patient history,  and epidemiological information.  Fact Sheet for Patients:   RoadLapTop.co.za  Fact Sheet for Healthcare Providers: http://kim-miller.com/  This test is not yet approved or  cleared by the [place] FDA and has been authorized for detection and/or diagnosis of SARS-CoV-2 by FDA under an Emergency Use Authorization (EUA).  This EUA will remain in effect (meaning this test can be used) for the duration of the COVID-19 declaration under Section 564(b)(1) of the Act, 21 U.S.C. section 360bbb-3(b)(1), unless the authorization is terminated or revoked sooner.  Performed at [facility], [address] [place], [place] [address]   Blood culture (routine x 2)     Status: None   Collection Time: 06/18/22 11:29 PM   Specimen: BLOOD  Result Value Ref Range Status   Specimen Description   Final    BLOOD BLOOD LEFT ARM Performed at [facility], [address] [place], [place] [address]    Special Requests   Final    BOTTLES DRAWN AEROBIC ONLY Blood Culture adequate volume Performed at [facility], [address] [place], [place] [address]    Culture   Final    NO GROWTH 5 DAYS Performed at [facility], [address], [place], [place] [address]    Report Status 06/24/2022 FINAL  Final  Blood culture (routine x 2)     Status: None   Collection Time: 06/18/22 11:34 PM    Specimen: BLOOD  Result Value Ref Range Status   Specimen Description   Final    BLOOD BLOOD LEFT ARM Performed at [facility], [address], [place], [place] [address]    Special Requests   Final    BOTTLES DRAWN AEROBIC AND ANAEROBIC Blood Culture adequate volume Performed at [facility], [address] [place], [place] [address]    Culture   Final    NO GROWTH 5 DAYS Performed at [facility], [address], [place], [place] [address]    Report Status 06/24/2022 FINAL  Final  Respiratory (~20 pathogens) panel by PCR     Status: Abnormal   Collection Time: 06/19/22  4:15 PM   Specimen: Nasopharyngeal Swab; Respiratory  Result Value Ref Range Status   Adenovirus NOT DETECTED NOT DETECTED Final   Coronavirus 229E NOT DETECTED NOT DETECTED Final    Comment: (NOTE) The Coronavirus on the Respiratory Panel, DOES NOT test for the novel  Coronavirus (2019 nCoV)    Coronavirus HKU1 NOT DETECTED NOT DETECTED Final   Coronavirus NL63 NOT DETECTED NOT DETECTED Final   Coronavirus OC43 NOT DETECTED NOT DETECTED Final   Metapneumovirus DETECTED (A) NOT DETECTED Final   Rhinovirus / Enterovirus NOT DETECTED NOT DETECTED Final   Influenza A NOT DETECTED NOT DETECTED Final   Influenza B NOT DETECTED NOT DETECTED Final   Parainfluenza Virus 1 NOT DETECTED NOT DETECTED Final   Parainfluenza Virus 2 NOT DETECTED NOT DETECTED Final   Parainfluenza Virus 3 NOT DETECTED NOT DETECTED Final   Parainfluenza Virus 4 NOT DETECTED NOT DETECTED Final   Respiratory Syncytial Virus NOT DETECTED NOT DETECTED Final   Bordetella pertussis NOT DETECTED NOT DETECTED Final   Bordetella Parapertussis NOT DETECTED NOT DETECTED Final   Chlamydophila pneumoniae NOT DETECTED NOT DETECTED Final   Mycoplasma pneumoniae NOT DETECTED NOT DETECTED Final    Comment: Performed at [facility], [address], [place], [place] [address]  MRSA Next Gen by PCR, Nasal  Status: None   Collection Time: 06/19/22  4:15 PM   Specimen: Nasal Mucosa; Nasal Swab  Result Value Ref Range Status   MRSA by PCR Next Gen NOT DETECTED NOT DETECTED Final    Comment: (NOTE) The GeneXpert MRSA Assay (FDA approved for NASAL specimens only), is one component of a comprehensive MRSA colonization surveillance program. It is not intended to diagnose MRSA infection nor to guide or monitor treatment for MRSA infections. Test performance is not FDA approved in patients less than 69 years old. Performed at [facility], [address] [place], [place] [address]     Antimicrobials: Anti-infectives (From admission, onward)    Start     Dose/Rate Route Frequency Ordered Stop   06/28/22 0600  vancomycin (VANCOCIN) IVPB 1000 mg/200 mL premix        1,000 mg 200 mL/hr over 60 Minutes Intravenous On call to O.R. 06/27/22 1002 06/29/22 0559   06/24/22 1615  vancomycin (VANCOCIN) IVPB 1000 mg/200 mL premix        1,000 mg 200 mL/hr over 60 Minutes Intravenous  Once 06/24/22 1516 06/24/22 1729   06/21/22 1100  doxycycline (VIBRA-TABS) tablet 100 mg        100 mg Oral Every 12 hours 06/21/22 1002 06/26/22 0959   06/19/22 2300  ceFEPIme (MAXIPIME) 2 g in sodium chloride 0.9 % 100 mL IVPB  Status:  Discontinued        2 g 200 mL/hr over 30 Minutes Intravenous Every 24 hours 06/19/22 0156 06/21/22 1002   06/19/22 0200  vancomycin (VANCOREADY) IVPB 1500 mg/300 mL        1,500 mg 150 mL/hr over 120 Minutes Intravenous  Once 06/19/22 0156 06/19/22 0622   06/19/22 0157  vancomycin variable dose per unstable renal function (pharmacist dosing)  Status:  Discontinued         Does not apply See admin instructions 06/19/22 0157 06/20/22 1123   06/18/22 2330  ceFEPIme (MAXIPIME) 2 g in sodium chloride 0.9 % 100 mL IVPB        2 g 200 mL/hr over 30 Minutes Intravenous  Once 06/18/22 2317 06/19/22 0007   06/18/22 2315  vancomycin (VANCOCIN) IVPB 1000 mg/200 mL premix         1,000 mg 200 mL/hr over 60 Minutes Intravenous  Once 06/18/22 2306 06/19/22 0203      Culture/Microbiology    Component Value Date/Time   SDES  06/18/2022 2334    BLOOD BLOOD LEFT ARM Performed at [facility], [address] [place], [place] [address]    SPECREQUEST  06/18/2022 2334    BOTTLES DRAWN AEROBIC AND ANAEROBIC Blood Culture adequate volume Performed at [facility], [address] [place], [place] [address]    CULT  06/18/2022 2334    NO GROWTH 5 DAYS Performed at [facility], [address], [place], [place] [address]    REPTSTATUS 06/24/2022 FINAL 06/18/2022 2334  Other culture-see note  Radiology Studies: VAS [place] UPPER EXT VEIN MAPPING (PRE-OP AVF)  Result Date: 06/25/2022 UPPER EXTREMITY VEIN MAPPING Patient Name:  SATYA BOHALL  Date of Exam:   06/25/2022 Medical Rec #: [id number]             Accession #:    [id number] Date of Birth: 1965-10-12             Patient Gender: F Patient Age:   79 years Exam Location:  [facility] Procedure:  VAS [place] UPPER EXT VEIN MAPPING (PRE-OP AVF) Referring Phys: Heath Lark --------------------------------------------------------------------------------  Indications: Pre-access. Limitations: Patient positioning and small diameter of vessels. Comparison Study: No previous study Performing Technologist: McKayla Maag RVT, [place]  Examination Guidelines: A complete evaluation includes B-mode imaging, spectral Doppler, color Doppler, and power Doppler as needed of all accessible portions of each vessel. Bilateral testing is considered an integral part of a complete examination. Limited examinations for reoccurring indications may be performed as noted. +-----------------+-------------+----------+--------------+ Right Cephalic   Diameter (cm)Depth (cm)   Findings    +-----------------+-------------+----------+--------------+ Shoulder             0.19        1.34                   +-----------------+-------------+----------+--------------+ Prox upper arm       0.27        1.62                  +-----------------+-------------+----------+--------------+ Mid upper arm        0.33        0.99                  +-----------------+-------------+----------+--------------+ Dist upper arm       0.25        0.58                  +-----------------+-------------+----------+--------------+ Antecubital fossa    0.37        0.39                  +-----------------+-------------+----------+--------------+ Prox forearm         0.07        0.44                  +-----------------+-------------+----------+--------------+ Mid forearm                             not visualized +-----------------+-------------+----------+--------------+ Dist forearm                            not visualized +-----------------+-------------+----------+--------------+ Wrist                                   not visualized +-----------------+-------------+----------+--------------+ +-----------------+-------------+----------+--------------+ Right Basilic    Diameter (cm)Depth (cm)   Findings    +-----------------+-------------+----------+--------------+ Shoulder                                not visualized +-----------------+-------------+----------+--------------+ Prox upper arm                          not visualized +-----------------+-------------+----------+--------------+ Mid upper arm                           not visualized +-----------------+-------------+----------+--------------+ Dist upper arm                          not visualized +-----------------+-------------+----------+--------------+ Antecubital fossa    0.07        0.52                  +-----------------+-------------+----------+--------------+ Prox forearm  0.11        0.41                  +-----------------+-------------+----------+--------------+ Mid forearm                              not visualized +-----------------+-------------+----------+--------------+ Distal forearm                          not visualized +-----------------+-------------+----------+--------------+ Elbow                                   not visualized +-----------------+-------------+----------+--------------+ Wrist                                   not visualized +-----------------+-------------+----------+--------------+ +-----------------+-------------+----------+---------+ Left Cephalic    Diameter (cm)Depth (cm)Findings  +-----------------+-------------+----------+---------+ Shoulder             0.44        2.10             +-----------------+-------------+----------+---------+ Prox upper arm       0.36        1.82             +-----------------+-------------+----------+---------+ Mid upper arm        0.36        1.36             +-----------------+-------------+----------+---------+ Dist upper arm       0.34        1.11   branching +-----------------+-------------+----------+---------+ Antecubital fossa    0.37        0.81             +-----------------+-------------+----------+---------+ Prox forearm                            thrombus  +-----------------+-------------+----------+---------+ Mid forearm                             thrombus  +-----------------+-------------+----------+---------+ Dist forearm                            thrombus  +-----------------+-------------+----------+---------+ +-----------------+-------------+----------+--------------+ Left Basilic     Diameter (cm)Depth (cm)   Findings    +-----------------+-------------+----------+--------------+ Shoulder                                not visualized +-----------------+-------------+----------+--------------+ Prox upper arm                          not visualized +-----------------+-------------+----------+--------------+ Mid upper arm                            not visualized +-----------------+-------------+----------+--------------+ Dist upper arm                          not visualized +-----------------+-------------+----------+--------------+ Antecubital fossa                       not visualized +-----------------+-------------+----------+--------------+ Prox forearm  0.18        0.37                  +-----------------+-------------+----------+--------------+ Mid forearm          0.15        0.19                  +-----------------+-------------+----------+--------------+ Distal forearm       0.11        0.29                  +-----------------+-------------+----------+--------------+ Wrist                                   not visualized +-----------------+-------------+----------+--------------+ Acute superficial thrombus formation in the left cephalic vein proximal forearm to distal forearm. *See table(s) above for measurements and observations.  Diagnosing physician: Sherald Hess MD Electronically signed by Sherald Hess MD on 06/25/2022 at 4:19:26 PM.    Final     LOS: 8 days  Lanae Boast, MD [facility]  06/27/2022, 10:22 AM

## 2022-06-27 NOTE — Progress Notes (Signed)
[facility]  Subjective  -no complaints   Objective 132/73 98 97.8 F (36.6 C) (Oral) 20 100%  Intake/Output Summary (Last 24 hours) at 06/27/2022 1014 Last data filed at 06/27/2022 0800 Gross per 24 hour  Intake 300 ml  Output 2800 ml  Net -2500 ml    Resting comfortably  Laboratory Lab Results: Recent Labs    06/26/22 0316 06/27/22 0225  WBC 13.2* 13.3*  HGB 7.8* 7.5*  HCT 23.1* 23.9*  PLT 231 220   BMET Recent Labs    06/26/22 0316 06/27/22 0225  NA 133* 134*  K 3.0* 3.2*  CL 102 97*  CO2 20* 24  GLUCOSE 195* 202*  BUN 62* 36*  CREATININE 4.14* 3.85*  CALCIUM 8.1* 8.5*    COAG Lab Results  Component Value Date   INR 1.1 10/08/2020   INR 1.1 01/24/2020   INR 1.2 05/08/2019   No results found for: "PTT"  Assessment/Planning:  57 year old female admitted with pneumonia as well as acute on chronic kidney disease with progression to end-stage renal disease.  Discussed plan for left arm AV fistula versus graft tomorrow with Dr. Karin Lieu in the OR.  She has a good cephalic vein on vein mapping.  All questions answered.  Risk benefits discussed.  Please keep n.p.o. after midnight.  Consent order placed in chart.  Cephus Shelling 06/27/2022 10:14 AM --

## 2022-06-27 NOTE — Progress Notes (Signed)
ANTICOAGULATION CONSULT NOTE - Initial Consult  Pharmacy Consult for heparin Indication: atrial fibrillation  Allergies  Allergen Reactions   Diazepam [name]   Gabapentin [name] and Swelling    Other reaction(s): Unknown   Iodinated Contrast Media Anaphylaxis    11/29/17 Cardiac arrest 1 min after IV contrast, possible allergy vs vasovagal episode Iopamidol  Anaphylaxis  High 11/28/2017  Patient had seizure like activity and then code post 100 cc of isovue 300     Isovue [Iopamidol] Anaphylaxis    11/28/17 Patient had seizure like activity and then 1 min code after 100 cc of isovue 300. Possible contrast allergy vs vasovagal episode   Lisinopril Anaphylaxis    Tongue and mouth swelling   Metoclopramide Other (See Comments)    Tardive dyskinesia  Also known as Reglan    Nsaids Anaphylaxis and Other (See Comments)    ULCER   Penicillins Palpitations    Has patient had a PCN reaction causing immediate rash, facial/tongue/throat swelling, SOB or lightheadedness with hypotension: Yes, heart races Has patient had a PCN reaction causing severe rash involving mucus membranes or skin necrosis: No Has patient had a PCN reaction that required hospitalization: Yes  Has patient had a PCN reaction occurring within the last 10 years: No    Tolmetin Nausea Only and Other (See Comments)    ULCER   Dicyclomine Other (See Comments)    Chest pain   Acetaminophen Nausea Only and Other (See Comments)    Irritates stomach ulcer; Abdominal pain   Cyclobenzaprine Palpitations   Oxycodone Palpitations   Rifamycins Palpitations   Tramadol Nausea And Vomiting    Patient Measurements: Height:  (167.6 cm) Weight: (!) 143.8 kg (317 lb 0.3 oz) IBW/kg (Calculated) : 59.3 Heparin Dosing Weight: 94.8 kg (significantly less than patients actual BW of 143.8 kg)  Vital Signs: Temp: 97.8 F (36.6 C) (04/21 0551) Temp Source: Oral (04/21 0551) BP: 132/73 (04/21 0551) Pulse  Rate: 98 (04/21 0551)  Labs: Recent Labs    06/25/22 0759 06/26/22 0316 06/27/22 0225  HGB 7.4* 7.8* 7.5*  HCT 23.7* 23.1* 23.9*  PLT 216 231 220  CREATININE 5.23* 4.14* 3.85*    Estimated Creatinine Clearance: 23.4 mL/min (A) (by C-G formula based on SCr of 3.85 mg/dL (H)).   Medical History: Past Medical History:  Diagnosis Date   Acute back pain with sciatica, left    Acute back pain with sciatica, right    AKI (acute kidney injury)    Anemia, unspecified    Cancer    Carcinoid tumor of duodenum    Chest pain with normal coronary angiography 2019   Chronic kidney disease, stage 3b    Chronic pain    Chronic systolic CHF (congestive heart failure)    Diabetes mellitus    DKA (diabetic ketoacidosis)    Drug-seeking behavior    21 hospitalizations and 14 CT a/p in 2 years for N/V and abdominal pain, demanding only IV dilaudid   Elevated troponin    chronic   Esophageal reflux    Fibromyalgia    Gastric ulcer    Gastroparesis    Gout    Hyperlipidemia    Hypertension    Hypokalemia    Hypomagnesemia    Lumbosacral stenosis    LVH (left ventricular hypertrophy)    Morbid obesity    NICM (nonischemic cardiomyopathy)    PAF (paroxysmal atrial fibrillation)    Stroke 02/2011   Thrombocytosis    Vitamin B12  deficiency anemia     Medications:   Scheduled:   allopurinol  50 mg Oral Once per day on Sun Wed   apixaban  5 mg Oral BID   atorvastatin  10 mg Oral Daily   carvedilol  12.5 mg Oral BID WC   Chlorhexidine Gluconate Cloth  6 each Topical Daily   Chlorhexidine Gluconate Cloth  6 each Topical Q0600   Chlorhexidine Gluconate Cloth  6 each Topical Q0600   donepezil  5 mg Oral QHS   febuxostat  40 mg Oral Daily   ferrous sulfate  325 mg Oral BID WC   folic acid  1 mg Oral Daily   guaiFENesin  600 mg Oral BID   insulin aspart  0-5 Units Subcutaneous QHS   insulin aspart  0-6 Units Subcutaneous TID WC   insulin aspart  4 Units Subcutaneous TID WC    insulin glargine-yfgn  10 Units Subcutaneous QHS   mirtazapine  30 mg Oral QHS   pantoprazole  40 mg Oral BID    Assessment: Patient is a 57 yof admitted for HCAP. Patient has a history of a fib and is on Eliquis 5 mg BID PTA and this has been continued while inpatient, last dose 4/21 at 840. Patient is to have an AV fistula VS graft on Wednesday 4/24 thus pharmacist has been consulted to transition from Eliquis to heparin in the perioperative setting   Platelets have been stable and wnl, hemoglobin has been low but stable, today at 7.5. Patient has ESRD and is on HD. Will monitor heparin with aPTT until Eliquis is out of her system and can use a HL.   Goal of Therapy:  Heparin level 0.3-0.7 units/ml aPTT 66-102 seconds Monitor platelets by anticoagulation protocol: Yes   Plan:  Start heparin IV infusion at 1400 units/hour around 8 pm on 4/21 No bolus since transitioning from full dose oral anticoagulation  Baseline aPTT and heparin level ordered  Daily heparin level and aPTT until correlation  aPTT in ~6 hours from start of heparin, can be drawn with AML CBC daily while on heparin, f/u signs of bleeding  F/u post operative status to resume oral Eliquis    Fara Olden, PharmD, BCPS, BCOP Clinical Pharmacist 06/27/2022,10:40 AM

## 2022-06-28 DIAGNOSIS — J189 Pneumonia, unspecified organism: Secondary | ICD-10-CM | POA: Diagnosis not present

## 2022-06-28 LAB — APTT: aPTT: 74 seconds — ABNORMAL HIGH (ref 24–36)

## 2022-06-28 LAB — BASIC METABOLIC PANEL
Anion gap: 13 (ref 5–15)
BUN: 43 mg/dL — ABNORMAL HIGH (ref 6–20)
CO2: 22 mmol/L (ref 22–32)
Calcium: 8.6 mg/dL — ABNORMAL LOW (ref 8.9–10.3)
Chloride: 96 mmol/L — ABNORMAL LOW (ref 98–111)
Creatinine, Ser: 4.8 mg/dL — ABNORMAL HIGH (ref 0.44–1.00)
GFR, Estimated: 10 mL/min — ABNORMAL LOW (ref >60)
Glucose, Bld: 213 mg/dL — ABNORMAL HIGH (ref 70–99)
Potassium: 3.3 mmol/L — ABNORMAL LOW (ref 3.5–5.1)
Sodium: 131 mmol/L — ABNORMAL LOW (ref 135–145)

## 2022-06-28 LAB — CBC
HCT: 23.9 % — ABNORMAL LOW (ref 36.0–46.0)
Hemoglobin: 7.7 g/dL — ABNORMAL LOW (ref 12.0–15.0)
MCH: 27.6 pg (ref 26.0–34.0)
MCHC: 32.2 g/dL (ref 30.0–36.0)
MCV: 85.7 fL (ref 80.0–100.0)
Platelets: 236 10*3/uL (ref 150–400)
RBC: 2.79 MIL/uL — ABNORMAL LOW (ref 3.87–5.11)
RDW: 20.1 % — ABNORMAL HIGH (ref 11.5–15.5)
WBC: 14 10*3/uL — ABNORMAL HIGH (ref 4.0–10.5)
nRBC: 0 % (ref 0.0–0.2)

## 2022-06-28 LAB — GLUCOSE, CAPILLARY
Glucose-Capillary: 144 mg/dL — ABNORMAL HIGH (ref 70–99)
Glucose-Capillary: 133 mg/dL — ABNORMAL HIGH (ref 70–99)
Glucose-Capillary: 296 mg/dL — ABNORMAL HIGH (ref 70–99)
Glucose-Capillary: 298 mg/dL — ABNORMAL HIGH (ref 70–99)

## 2022-06-28 LAB — HEPARIN LEVEL (UNFRACTIONATED): Heparin Unfractionated: 0.99 IU/mL — ABNORMAL HIGH (ref 0.30–0.70)

## 2022-06-28 MED ORDER — DARBEPOETIN ALFA 40 MCG/0.4ML IJ SOSY
40.0000 ug | PREFILLED_SYRINGE | INTRAMUSCULAR | Status: DC
  Administered 2022-06-28: 40 ug via SUBCUTANEOUS
  Filled 2022-06-28: qty 0.4

## 2022-06-28 MED ORDER — HEPARIN SODIUM (PORCINE) 1000 UNIT/ML DIALYSIS: 2500.0000 [IU] | INTRAMUSCULAR | Status: DC | PRN

## 2022-06-28 MED ORDER — INSULIN GLARGINE-YFGN 100 UNIT/ML ~~LOC~~ SOLN
14.0000 [IU] | Freq: Every day | SUBCUTANEOUS | Status: DC
  Administered 2022-06-28: 14 [IU] via SUBCUTANEOUS
  Filled 2022-06-28 (×2): qty 0.14

## 2022-06-28 MED ORDER — HEPARIN SODIUM (PORCINE) 1000 UNIT/ML DIALYSIS
2500.0000 [IU] | Freq: Once | INTRAMUSCULAR | Status: AC
  Administered 2022-06-28: 2500 [IU] via INTRAVENOUS_CENTRAL
  Filled 2022-06-28: qty 3

## 2022-06-28 MED ORDER — ACETAMINOPHEN 325 MG PO TABS: 650.0000 mg | ORAL_TABLET | ORAL | Status: AC

## 2022-06-28 NOTE — Progress Notes (Signed)
ANTICOAGULATION CONSULT NOTE - follow-up  Pharmacy Consult for heparin Indication: atrial fibrillation  Allergies  Allergen Reactions   Diazepam [name]   Gabapentin [name] and Swelling    Other reaction(s): Unknown   Iodinated Contrast Media Anaphylaxis    11/29/17 Cardiac arrest 1 min after IV contrast, possible allergy vs vasovagal episode Iopamidol  Anaphylaxis  High 11/28/2017  Patient had seizure like activity and then code post 100 cc of isovue 300     Isovue [Iopamidol] Anaphylaxis    11/28/17 Patient had seizure like activity and then 1 min code after 100 cc of isovue 300. Possible contrast allergy vs vasovagal episode   Lisinopril Anaphylaxis    Tongue and mouth swelling   Metoclopramide Other (See Comments)    Tardive dyskinesia  Also known as Reglan    Nsaids Anaphylaxis and Other (See Comments)    ULCER   Penicillins Palpitations    Has patient had a PCN reaction causing immediate rash, facial/tongue/throat swelling, SOB or lightheadedness with hypotension: Yes, heart races Has patient had a PCN reaction causing severe rash involving mucus membranes or skin necrosis: No Has patient had a PCN reaction that required hospitalization: Yes  Has patient had a PCN reaction occurring within the last 10 years: No    Tolmetin Nausea Only and Other (See Comments)    ULCER   Dicyclomine Other (See Comments)    Chest pain   Acetaminophen Nausea Only and Other (See Comments)    Irritates stomach ulcer; Abdominal pain   Cyclobenzaprine Palpitations   Oxycodone Palpitations   Rifamycins Palpitations   Tramadol Nausea And Vomiting    Patient Measurements: Height:  (167.6 cm) Weight: (!) 144.1 kg (317 lb 10.9 oz) (BED) IBW/kg (Calculated) : 59.3 Heparin Dosing Weight: 94.8 kg    Vital Signs: Temp: 98.8 F (37.1 C) (04/22 2001) Temp Source: Oral (04/22 2001) BP: 98/60 (04/22 2001) Pulse Rate: 99 (04/22 2001)  Labs: Recent Labs     06/26/22 0316 06/27/22 0225 06/27/22 1111 06/28/22 0255 06/28/22 2010  HGB 7.8* 7.5*  --  7.7*  --   HCT 23.1* 23.9*  --  23.9*  --   PLT 231 220  --  236  --   APTT  --   --  31  --  74*  HEPARINUNFRC  --   --  >1.10*  --  0.99*  CREATININE 4.14* 3.85*  --  4.80*  --      Estimated Creatinine Clearance: 18.8 mL/min (A) (by C-G formula based on SCr of 4.8 mg/dL (H)).   Medical History: Past Medical History:  Diagnosis Date   Acute back pain with sciatica, left    Acute back pain with sciatica, right    AKI (acute kidney injury)    Anemia, unspecified    Cancer    Carcinoid tumor of duodenum    Chest pain with normal coronary angiography 2019   Chronic kidney disease, stage 3b    Chronic pain    Chronic systolic CHF (congestive heart failure)    Diabetes mellitus    DKA (diabetic ketoacidosis)    Drug-seeking behavior    21 hospitalizations and 14 CT a/p in 2 years for N/V and abdominal pain, demanding only IV dilaudid   Elevated troponin    chronic   Esophageal reflux    Fibromyalgia    Gastric ulcer    Gastroparesis    Gout    Hyperlipidemia    Hypertension  Hypokalemia    Hypomagnesemia    Lumbosacral stenosis    LVH (left ventricular hypertrophy)    Morbid obesity    NICM (nonischemic cardiomyopathy)    PAF (paroxysmal atrial fibrillation)    Stroke 02/2011   Thrombocytosis    Vitamin B12 deficiency anemia     Medications:   Scheduled:   acetaminophen  650 mg Oral STAT   allopurinol  50 mg Oral Once per day on Sun Wed   atorvastatin  10 mg Oral Daily   carvedilol  12.5 mg Oral BID WC   Chlorhexidine Gluconate Cloth  6 each Topical Daily   Chlorhexidine Gluconate Cloth  6 each Topical Q0600   darbepoetin (ARANESP) injection - NON-DIALYSIS  40 mcg Subcutaneous Q Mon-1800   donepezil  5 mg Oral QHS   febuxostat  40 mg Oral Daily   ferrous sulfate  325 mg Oral BID WC   folic acid  1 mg Oral Daily   guaiFENesin  600 mg Oral BID   insulin aspart   0-5 Units Subcutaneous QHS   insulin aspart  0-6 Units Subcutaneous TID WC   insulin aspart  4 Units Subcutaneous TID WC   insulin glargine-yfgn  14 Units Subcutaneous QHS   mirtazapine  30 mg Oral QHS   pantoprazole  40 mg Oral BID    Assessment: Patient is a 83 yof admitted for HCAP. Patient has a history of a fib and is on Eliquis 5 mg BID PTA and this has been continued while inpatient, last dose 4/21 at 840. Patient is to have an AV fistula VS graft on Wednesday 4/24 thus pharmacist has been consulted to transition from Eliquis to heparin in the perioperative setting   Platelets have been stable and wnl, hemoglobin has been low but stable, today at 7.5. Patient has ESRD and is on HD. Will monitor heparin with aPTT until Eliquis is out of her system and can use a HL.   4/22 PM update: Heparin level:0.99 aPTT: 74 seconds No signs of bleeding No issues with the infusion   Goal of Therapy:  Heparin level 0.3-0.7 units/ml aPTT 66-102 seconds Monitor platelets by anticoagulation protocol: Yes   Plan:  Continue heparin at 1400 units/hour Confirmatory levels with 4/23 AM labs Heparin level and aPTT do not correlate  Daily heparin level and aPTT until correlation  CBC daily while on heparin, f/u signs of bleeding  F/u post operative status to resume oral Eliquis    Antwan Bribiesca BS, PharmD, BCPS Clinical Pharmacist 06/28/2022 9:06 PM  Contact: [phone] after 3 PM  "Be curious, not judgmental..." -Debbora Dus

## 2022-06-28 NOTE — Progress Notes (Signed)
Mobility Specialist Progress Note   06/28/22 1505  Mobility  Activity Transferred to/from [facility]  Level of Assistance +2 (takes two people)  Assistive Device Other (Comment) (HHA)  Distance Ambulated (ft) 2 ft  Activity Response Tolerated well  Mobility Referral Yes  $Mobility charge 1 Mobility   NT requesting assistance to get pt back to chair from [facility]. Pt expressing that LE's had gone numb from sitting too long and they required more physical assistance. +2A ModA to stand and stabilize pt until they were able to steady self. ModA to pivot to chair. No faults on transfer but pt flexed the entire time. Pt left in chair w/ NT present in room.    Frederico Hamman Mobility Specialist Please contact via SecureChat or  Rehab office at [phone]

## 2022-06-28 NOTE — Progress Notes (Signed)
Admit: 06/18/2022 LOS: 9  42F AoCKD4, multifocal pneumonia likley 2/2 metapneumovirus, recent admission for disseminated gout with encephalopathy and AKI, just discharged from SNF.   Subjective:  See my procedure note from this AM.  See that her permanent access placement planned earlier for today was postponed due to being on eliquis as well as infection work-up (fever of 101 and leukocytosis).  She and I discussed the risks/benefits/indications for ESA and she does consent to ESA.   Review of systems:  Denies n/v [name] but better Denies chest pain  Legs hurt      Physical Exam:  Blood pressure (!) 138/59, pulse 96, temperature 98.1 F (36.7 C), temperature source Oral, resp. rate 11, height  (1.676 m), weight (!) 144.1 kg, last menstrual period 10/10/2012, SpO2 96 %. General adult female in bed in no acute distress HEENT normocephalic atraumatic extraocular movements intact sclera anicteric Neck supple trachea midline Lungs clear to auscultation bilaterally normal work of breathing at rest  Heart S1S2 no rub Abdomen soft nontender nondistended Extremities diffuse 2+ edema lower extremities  Psych normal mood and affect Access RIJ tunn catheter    Home meds - zyloprim, albuterol, norvasc 10, eliquis, lipitor, coreg 12.5 bid, colchicine, aricept, pepcid, hydralazine 50 tid, insulin aspart/ glargine/ lispro, remeron, mVI, sod bicarb, torsemide 20 qd, uloric, protonix, prns    Date   Creat  eGFR  2018-2020  1.01- 1.39   2021   1.31- 2.70  2022   1.64- 4.38  2023   1.72- 6.25 Mar 2022  3.02- 5.93 8- 17 ml/min  March 2024  1.92- 4.59 10-30 ml/min  4/12   4.84  10 ml/min    4/13   4.82  4/14   4.83  06/20/21  4.99  9 ml/min     B/l creat 2.1- 3.1 from march 2023, eGFR 17- 28 ml/ min   UA 11-20 wbc, 5-10 rbc from 06/19/22, prot 30   CT abd renal --> normal appearing kidneys w/o obstruction    Assessment/ Plan AoCKD4: b/l creat 2.1- 3.1 from march 2023, eGFR  17-28 ml/min. Creat here 4.8 on admission in setting of MPV resp infection and lower ext edema. Followed by Dr Thedore Mins at [facility]. UA unremarkable, CT abd w/o obstruction. Lasix started at 40 mg bid IV then titrated up to  IV tid but no sig improvement in wt's or edema since admission. Pt's brother (esrd on HD) called and discussed with team that her QOL is "horrible" and has been for the last couple of years due to her chronic fluid overload issues and he doesn't see her getting better. He felt she would do better on dialysis. Nephrology d/w the patient and she acknowledged very difficult times lately due to chronic fluid overload refractory to diuretics.  TDC placed by IR and pt had her 1st HD Friday 4/19.  [facility] is planning on permanent access soon - this was rescheduled for Wed, 4/24.  Appreciate their assistance HD today I have started CLIP process   Nutrition - pt has very limited diet and requested change to regular diet  HD access - [facility] consulting, plan is for new access this week.  Metapneumovirus - admit diagnosis, w/ multifocal pneumonia R > L, sp IV vanc + cefepime, BCx NGTD. Is now on po doxy.  Lower extremity edema - bilat diffuse LE"s which is improving now w/ dialysis Hypertension - controlled; optimize volume with HD  Gout - with severe hyperuricemia on prednisone colchicine and  allopurinol Anemia of CKD -  worsening, transfuse per TRH.  Start aranesp 40 mcg every Monday   History of HFpEF, normal LVEF - optimize volume with HD * DM2 with history of HHS.  Per primary team  Atrial fibrillation - on rate control and apixaban Lumbar spinal stenosis. Chronic pain on narcotics   Recent Labs  Lab 06/25/22 0759 06/26/22 0316 06/27/22 0225 06/28/22 0255  HGB 7.4*   < > 7.5* 7.7*  ALBUMIN 2.0*  --   --   --   CALCIUM 8.3*   < > 8.5* 8.6*  PHOS 8.1*  --   --   --   CREATININE 5.23*   < > 3.85* 4.80*  K 3.3*   < > 3.2* 3.3*   < > = values in this interval not displayed.     Inpatient medications:  acetaminophen  650 mg Oral STAT   allopurinol  50 mg Oral Once per day on Sun Wed   atorvastatin  10 mg Oral Daily   carvedilol  12.5 mg Oral BID WC   Chlorhexidine Gluconate Cloth  6 each Topical Daily   Chlorhexidine Gluconate Cloth  6 each Topical Q0600   donepezil  5 mg Oral QHS   febuxostat  40 mg Oral Daily   ferrous sulfate  325 mg Oral BID WC   folic acid  1 mg Oral Daily   guaiFENesin  600 mg Oral BID   insulin aspart  0-5 Units Subcutaneous QHS   insulin aspart  0-6 Units Subcutaneous TID WC   insulin aspart  4 Units Subcutaneous TID WC   insulin glargine-yfgn  14 Units Subcutaneous QHS   mirtazapine  30 mg Oral QHS   pantoprazole  40 mg Oral BID    heparin 1,400 Units/hr (06/28/22 1034)    albuterol, guaiFENesin, HYDROmorphone, loperamide, ondansetron **OR** ondansetron (ZOFRAN) IV, mouth rinse    Estanislado Emms, MD 3:23 PM 06/28/2022

## 2022-06-28 NOTE — Progress Notes (Signed)
Heparin restarted verified by Abelardo Diesel

## 2022-06-28 NOTE — Inpatient Diabetes Management (Signed)
Inpatient Diabetes Program Recommendations  AACE/ADA: New Consensus Statement on Inpatient Glycemic Control   Target Ranges:  Prepandial:   less than 140 mg/dL      Peak postprandial:   less than 180 mg/dL (1-2 hours)      Critically ill patients:  140 - 180 mg/dL    Latest Reference Range & Units 06/27/22 07:37 06/27/22 11:33 06/27/22 16:49 06/27/22 20:05 06/28/22 07:26  Glucose-Capillary 70 - 99 mg/dL 409 (H) 811 (H) 914 (H) 251 (H) 144 (H)   Review of Glycemic Control  Diabetes history: DM2 Outpatient Diabetes medications: Semglee 20 units BID, Humalog 2-14 units BID Current orders for Inpatient glycemic control: Semglee 10 units QHS, Novolog 0-6 units TID with meals, Novolog 0-5 units QHS, Novolog 4 units TID with meals  Inpatient Diabetes Program Recommendations:    Insulin: Please consider increasing meal coverage to Novolog 6 units TID with meals.  Thanks, Orlando Penner, RN, MSN, CDCES Diabetes Coordinator Inpatient Diabetes Program [phone] (Team Pager from 8am to 5pm)

## 2022-06-28 NOTE — Progress Notes (Signed)
L FA IV access with Heparin drip occluded. Flushed with NS and reposition arm but with no success. Pharmacy notified. IV pump turned off. Will await for IV team for new access.

## 2022-06-28 NOTE — Progress Notes (Signed)
Pt yellow mews. Dr and charge notified. Pt refuses motrin and tylenol states allergy that causes nausea and racing heart.

## 2022-06-28 NOTE — Procedures (Signed)
Seen and examined on dialysis.  Blood pressure 141/66 on HD and HR 104.  Tolerating goal.  Tunn RIJ catheter in use.   She is having an IV placed right now.  Discussed that we would seek outpatient unit placement  Estanislado Emms, MD 06/28/2022 10:11 AM

## 2022-06-28 NOTE — Progress Notes (Signed)
Received patient in bed to unit.  Alert and oriented.  Informed consent signed and in chart.   TX duration:3.25  Patient tolerated well.  Transported back to the room  Alert, without acute distress.  Hand-off given to patient's nurse.   Access used: right [facility] Access issues: NONE  Total UF removed: 2.3L Medication(s) given: NONE   06/28/22 1147  Vitals  Temp 98.1 F (36.7 C)  Temp Source Oral  BP 125/69  MAP (mmHg) 87  BP Location Right Wrist  BP Method Automatic  Patient Position (if appropriate) Lying  Pulse Rate (!) 102  Pulse Rate Source Monitor  ECG Heart Rate (!) 102  Resp 13  Oxygen Therapy  SpO2 98 %  O2 Device Room Air  During Treatment Monitoring  HD Safety Checks Performed Yes  Intra-Hemodialysis Comments Tx completed  Dialysis Fluid Bolus Normal Saline  Bolus Amount (mL) 300 mL      [name]

## 2022-06-28 NOTE — Progress Notes (Signed)
  Progress Note    06/28/2022 7:20 AM * No surgery date entered *  Subjective:  feels like she has a fever. No complaints    Vitals:   06/27/22 2003 06/28/22 0551  BP: 100/83 (!) 141/65  Pulse: 69 (!) 102  Resp: 18 20  Temp: 99.1 F (37.3 C) (!) 101.3 F (38.5 C)  SpO2: (!) 88% 94%    Physical Exam: General:  resting, warm to touch Cardiac:  regular Lungs:  nonlabored Extremities:  palpable radial and brachial pulses bilaterally   CBC    Component Value Date/Time   WBC 14.0 (H) 06/28/2022 0255   RBC 2.79 (L) 06/28/2022 0255   HGB 7.7 (L) 06/28/2022 0255   HGB 8.8 (L) 10/09/2019 0906   HGB 9.6 (L) 09/03/2019 1620   HCT 23.9 (L) 06/28/2022 0255   HCT 30.4 (L) 09/03/2019 1620   PLT 236 06/28/2022 0255   PLT 348 10/09/2019 0906   PLT 451 (H) 09/03/2019 1620   MCV 85.7 06/28/2022 0255   MCV 85 09/03/2019 1620   MCH 27.6 06/28/2022 0255   MCHC 32.2 06/28/2022 0255   RDW 20.1 (H) 06/28/2022 0255   RDW 13.6 09/03/2019 1620   LYMPHSABS 2.0 06/03/2022 0432   LYMPHSABS 2.2 09/03/2019 1620   MONOABS 1.0 06/03/2022 0432   EOSABS 0.0 06/03/2022 0432   EOSABS 0.3 09/03/2019 1620   BASOSABS 0.0 06/03/2022 0432   BASOSABS 0.1 09/03/2019 1620    BMET    Component Value Date/Time   NA 131 (L) 06/28/2022 0255   NA 137 09/20/2019 1530   K 3.3 (L) 06/28/2022 0255   CL 96 (L) 06/28/2022 0255   CO2 22 06/28/2022 0255   GLUCOSE 213 (H) 06/28/2022 0255   BUN 43 (H) 06/28/2022 0255   BUN 18 09/20/2019 1530   CREATININE 4.80 (H) 06/28/2022 0255   CALCIUM 8.6 (L) 06/28/2022 0255   GFRNONAA 10 (L) 06/28/2022 0255   GFRAA 49 (L) 11/30/2019 0356    INR    Component Value Date/Time   INR 1.1 10/08/2020 0017     Intake/Output Summary (Last 24 hours) at 06/28/2022 0720 Last data filed at 06/28/2022 0400 Gross per 24 hour  Intake 642.6 ml  Output --  Net 642.6 ml      Assessment/Plan:  57 y.o. female with HCAP   -Patient has a fever this morning at 101F and  increasing leukocytosis at 14k. Blood cultures are pending. She was admitted for pneumonia -Surgical plan was for left arm fistula vs graft today, however patient was receiving her Eliquis so surgery has to be delayed. Surgery tentatively rescheduled to Wednesday, now pending infection workup -Palpable brachial and radial pulses bilaterally -Please continue to hold Eliquis   Loel Dubonnet, PA-C [facility] [phone] 06/28/2022 7:20 AM

## 2022-06-28 NOTE — Progress Notes (Signed)
Requested to see pt for out-pt HD needs at d/c. Met with pt at bedside. Introduced self and explained role. Pt from home alone and plans to return there at d/c per pt. Pt lives in [place] and prefers a clinic close to home. Referral submitted to [facility] admissions today for review. Pt states she will need assistance with transportation to/from HD at d/c. Pt also requesting assistance with DME at home at d/c. Contacted RN CM regarding pt's requests for assistance with transportation and DME. Will assist as needed.   Olivia Canter Renal Navigator [phone]

## 2022-06-28 NOTE — Progress Notes (Signed)
PROGRESS NOTE Deborah [name]  ZOX:[id number] DOB: October 23, 1965 DOA: 06/18/2022 PCP: Elsie Amis, MD  Brief Narrative/Hospital Course: 57 y.o.f w/ medical history significant for insulin-dependent diabetes mellitus, hypertension, PAF on Eliquis, CKD stage IV, iron deficiency anemia, chronic pain, duodenal carcinoid tumor resected in 2021 and recent admission 05/28/22 -06/04/22 with diagnosis of acute metabolic encephalopathy due to dehydration with associated AKI on CKDIV, with course complicated by disseminated gout and discharged on colchine, Uloric  and allopurinol as well as steroid taper to SNF presented with worsening shortness of breath, fluid overload past 24 hours In the WJ:XBJYNW has revealed multiple opacities on chest x-ray, COVID-19 is negative and respiratory viral panel is positive for metapneumovirus infection.  Placed on empiric antibiotics, seen by nephrology for fluid overload and placed on high-dose Lasix without improvement. Subsequently transferred to [facility] 4/18 for dialysis and access Underwent right IJ tunneled HD catheter placement by IR 4/18 Seen by Dr. Lenell Antu from vascular for HD access advised supportive left upper extremity from further venipuncture and awaiting vein mapping   Subjective: Patient seen and examined in dialysis this morning  He appears pleasant today reports he is able to eat some food after being on regular diet but agrees to watch for fluid and potassium intake  Wt :343 lb  on 4/17> 317 lb 4/21> 322 lb    Assessment and Plan: Principal Problem:   HCAP (healthcare-associated pneumonia)  Metapneumovirus multifocal pneumonia right more than left: Initially received vancomycin and cefepime,blood culture no growth to date transitioned to doxycycline and completed. Continue to provide respiratory support withOOB, IS  bronchodilators as needed.  Has mild cough overall relatively stable respiratory status   Acute on chronic kidney disease  stage IV Bilateral lower extremity edema: B/L creat 2.1-3.1 from March 2023>4.8 creatinine on admission.  AKI in the setting of metapneumovirus respiratory infection and with fluid overload.UA unremarkable CT abdomen without obstruction, placed on high-dose Lasix without improvement in leg edema despite good urine output >subsequently s/p tunneled HD placement and dialysis initiated 4/19.  Continue HD per nephrology,  VVS following for access> holding close 4/21, on heparin drip transition for fistula on 4/24 Monitor renal function and net output as below  She is on renal diet but was very upset given limitation in her diet and changed to regular diet-with instruction for low-salt low potassium and fluid restriction  >Avoid nephrotoxic medications including NSAIDs and iodinated intravenous contrast exposure unless the latter is absolutely indicated.Preferred narcotic agents for pain control GNF:[id number], fentanyl, and methadone and avoid morphine.Avoid Baclofen and avoid oral sodium phosphate and magnesium citrate based laxatives / bowel preps. Continue strict Input and Output monitoring and serial renal functions.  Net IO Since Admission: -10,396.04 mL [06/28/22 1157]  Filed Weights   06/25/22 1110 06/26/22 2002 06/28/22 0811  Weight: (!) 145.3 kg (!) 143.8 kg (!) 146.4 kg   Hypokalemia: adjsut w/ HD Metabolic acidosis in the setting of CKD and AKI monitor. Hco3 better   Hypertension Chronic CHF with preserved EF: Bp stable.Fluid management  per HD see above. Continue Coreg, BP control  Atrial fibrillation rate controlled on Coreg, changed Eliquis> heparin drip 4/21 preop  Anemia multifactorial from IDA and CKD: Hemoglobin remains stable -Cont iron supplement, folate monitor> trend hb and transfuse if less than 7 g.  Pt had 2 u prbc done on 4/13. Recent Labs  Lab 06/24/22 0516 06/25/22 0759 06/26/22 0316 06/27/22 0225 06/28/22 0255  HGB 7.7* 7.4* 7.8* 7.5* 7.7*  HCT 24.7* 23.7*  23.1* 23.9* 23.9*     Hyperlipidemia continue statin  Chronic pain Left-sided abdominal pain Lumbar stenosis: Underwent CT renal study without obvious acute finding, has diverticulosis.  LFTs are stable.  Lipase mildly elevated but in the setting of CKD AKI.  Cont pain management and stool softener.    Hyperuricemia/gout with recent flare of and disseminated into hyperuricemia and encephalopathy: Uric acid now down to 8, was 16.5 at previous admission.  Continue colchicine, allopurinol. completed prednisone taper previously. Continue on colchicine and uloric.  Type 2 diabetes mellitus history of HHS: HbA1c poorly controlled 8.1 blood sugar borderline controlled increase Semglee to 14 units continue Premeal and SSI  and trend.  If remains poorly controlled will need to change to carbohydrate diet Recent Labs  Lab 06/27/22 0737 06/27/22 1133 06/27/22 1649 06/27/22 2005 06/28/22 0726  GLUCAP 152* 208* 300* 251* 144*     Morbid Obesity:Patient's Body mass index is 52.09 kg/m. : Will benefit with PCP follow-up, weight loss  healthy lifestyle and outpatient sleep evaluation.  DVT prophylaxis: Eliquis Code Status:   Code Status: Full Code Family Communication: plan of care discussed with patient at bedside. Patient status is: Inpatient because of need for dialysis Level of care: Telemetry Medical   Dispo: The patient is from: SNF            Anticipated disposition: SNF TBD Objective: Vitals last 24 hrs: Vitals:   06/28/22 1100 06/28/22 1130 06/28/22 1147 06/28/22 1153  BP: 118/68 120/77 125/69 (!) 138/59  Pulse: (!) 105 (!) 102 (!) 102 (!) 103  Resp: Temp:   98.1 F (36.7 C)   TempSrc:   Oral   SpO2: 100% 99% 98% 98%  Weight:      Height:       Weight change:   Physical Examination: General exam: AAox3, weak,older appearing HEENT:Oral mucosa moist, Ear/Nose WNL grossly, dentition normal. Respiratory system: bilaterally diminished BS, no use of accessory  muscle Cardiovascular system: S1 & S2 +, regular rate,. Gastrointestinal system: Abdomen soft, NT,ND,BS+ Nervous System:Alert, awake, moving extremities and grossly nonfocal Extremities: LE ankle edema +, lower extremities warm Skin: No rashes,no icterus. MSK: Normal muscle bulk,tone, power  HD catheter+ rt ij  Medications reviewed: Scheduled Meds:  acetaminophen  650 mg Oral STAT   allopurinol  50 mg Oral Once per day on Sun Wed   atorvastatin  10 mg Oral Daily   carvedilol  12.5 mg Oral BID WC   Chlorhexidine Gluconate Cloth  6 each Topical Daily   Chlorhexidine Gluconate Cloth  6 each Topical Q0600   donepezil  5 mg Oral QHS   febuxostat  40 mg Oral Daily   ferrous sulfate  325 mg Oral BID WC   folic acid  1 mg Oral Daily   guaiFENesin  600 mg Oral BID   insulin aspart  0-5 Units Subcutaneous QHS   insulin aspart  0-6 Units Subcutaneous TID WC   insulin aspart  4 Units Subcutaneous TID WC   insulin glargine-yfgn  10 Units Subcutaneous QHS   mirtazapine  30 mg Oral QHS   pantoprazole  40 mg Oral BID  Continuous Infusions:  heparin 1,400 Units/hr (06/28/22 1034)     Diet Order             Diet regular Fluid consistency: Thin; Fluid restriction: 1500 mL Fluid  Diet effective now                  Intake/Output  Summary (Last 24 hours) at 06/28/2022 1157 Last data filed at 06/28/2022 1153 Gross per 24 hour  Intake 342.6 ml  Output 2300 ml  Net -1957.4 ml    Net IO Since Admission: -10,396.04 mL [06/28/22 1157]  Wt Readings from Last 3 Encounters:  06/28/22 (!) 146.4 kg  06/04/22 130.5 kg  05/13/22 (!) 139.2 kg     Unresulted Labs (From admission, onward)     Start     Ordered   06/29/22 0500  APTT  Daily,   R     Question:  Specimen collection method  Answer:  Lab=Lab collect   06/28/22 0258   06/29/22 0500  Heparin level (unfractionated)  Daily,   R     Question:  Specimen collection method  Answer:  Lab=Lab collect   06/28/22 0258   06/28/22 0626   Culture, blood (Routine X 2) w Reflex to ID Panel  BLOOD CULTURE X 2,   R (with TIMED occurrences)      06/28/22 0625   06/26/22 0500  CBC  Daily,   R      06/25/22 0819          Data Reviewed: I have personally reviewed following labs and imaging studies CBC: Recent Labs  Lab 06/24/22 0516 06/25/22 0759 06/26/22 0316 06/27/22 0225 06/28/22 0255  WBC 12.2* 13.5* 13.2* 13.3* 14.0*  HGB 7.7* 7.4* 7.8* 7.5* 7.7*  HCT 24.7* 23.7* 23.1* 23.9* 23.9*  MCV 86.1 86.8 83.1 86.6 85.7  PLT 196 216 231 220 236    Basic Metabolic Panel: Recent Labs  Lab 06/24/22 0516 06/25/22 0759 06/26/22 0316 06/27/22 0225 06/28/22 0255  NA 136 138 133* 134* 131*  K 3.1* 3.3* 3.0* 3.2* 3.3*  CL 107 108 102 97* 96*  CO2 16* 14* 20* 24 22  GLUCOSE 176* 159* 195* 202* 213*  BUN 110* 103* 62* 36* 43*  CREATININE 5.21* 5.23* 4.14* 3.85* 4.80*  CALCIUM 8.3* 8.3* 8.1* 8.5* 8.6*  PHOS  --  8.1*  --   --   --     GFR: Estimated Creatinine Clearance: 19 mL/min (A) (by C-G formula based on SCr of 4.8 mg/dL (H)). Liver Function Tests: Recent Labs  Lab 06/25/22 0759  ALBUMIN 2.0*    No results for input(s): "LIPASE", "AMYLASE" in the last 168 hours. CBG: Recent Labs  Lab 06/27/22 0737 06/27/22 1133 06/27/22 1649 06/27/22 2005 06/28/22 0726  GLUCAP 152* 208* 300* 251* 144*    No results for input(s): "PROCALCITON", "LATICACIDVEN" in the last 168 hours.   Recent Results (from the past 240 hour(s))  SARS Coronavirus 2 by RT PCR (hospital order, performed in [facility] hospital lab) *cepheid single result test* Anterior Nasal Swab     Status: None   Collection Time: 06/18/22  9:16 PM   Specimen: Anterior Nasal Swab  Result Value Ref Range Status   SARS Coronavirus 2 by RT PCR NEGATIVE NEGATIVE Final    Comment: (NOTE) SARS-CoV-2 target nucleic acids are NOT DETECTED.  The SARS-CoV-2 RNA is generally detectable in upper and lower respiratory specimens during the acute phase of  infection. The lowest concentration of SARS-CoV-2 viral copies this assay can detect is 250 copies / mL. A negative result does not preclude SARS-CoV-2 infection and should not be used as the sole basis for treatment or other patient management decisions.  A negative result may occur with improper specimen collection / handling, submission of specimen other than nasopharyngeal swab, presence of viral mutation(s) within the  areas targeted by this assay, and inadequate number of viral copies (<250 copies / mL). A negative result must be combined with clinical observations, patient history, and epidemiological information.  Fact Sheet for Patients:   RoadLapTop.co.za  Fact Sheet for Healthcare Providers: http://kim-miller.com/  This test is not yet approved or  cleared by the [place] FDA and has been authorized for detection and/or diagnosis of SARS-CoV-2 by FDA under an Emergency Use Authorization (EUA).  This EUA will remain in effect (meaning this test can be used) for the duration of the COVID-19 declaration under Section 564(b)(1) of the Act, 21 U.S.C. section 360bbb-3(b)(1), unless the authorization is terminated or revoked sooner.  Performed at [facility], [address], [place], [place] [address]   Blood culture (routine x 2)     Status: None   Collection Time: 06/18/22 11:29 PM   Specimen: BLOOD  Result Value Ref Range Status   Specimen Description   Final    BLOOD BLOOD LEFT ARM Performed at [facility], [address], [place], [place] [address]    Special Requests   Final    BOTTLES DRAWN AEROBIC ONLY Blood Culture adequate volume Performed at [facility], [address] [place], [place] [address]    Culture   Final    NO GROWTH 5 DAYS Performed at [facility], [address] [place], [place] [address]    Report Status 06/24/2022 FINAL  Final   Blood culture (routine x 2)     Status: None   Collection Time: 06/18/22 11:34 PM   Specimen: BLOOD  Result Value Ref Range Status   Specimen Description   Final    BLOOD BLOOD LEFT ARM Performed at [facility], [address] [place], [place] [address]    Special Requests   Final    BOTTLES DRAWN AEROBIC AND ANAEROBIC Blood Culture adequate volume Performed at [facility], [address] [place], [place] [address]    Culture   Final    NO GROWTH 5 DAYS Performed at [facility], [address], [place], [place] [address]    Report Status 06/24/2022 FINAL  Final  Respiratory (~20 pathogens) panel by PCR     Status: Abnormal   Collection Time: 06/19/22  4:15 PM   Specimen: Nasopharyngeal Swab; Respiratory  Result Value Ref Range Status   Adenovirus NOT DETECTED NOT DETECTED Final   Coronavirus 229E NOT DETECTED NOT DETECTED Final    Comment: (NOTE) The Coronavirus on the Respiratory Panel, DOES NOT test for the novel  Coronavirus (2019 nCoV)    Coronavirus HKU1 NOT DETECTED NOT DETECTED Final   Coronavirus NL63 NOT DETECTED NOT DETECTED Final   Coronavirus OC43 NOT DETECTED NOT DETECTED Final   Metapneumovirus DETECTED (A) NOT DETECTED Final   Rhinovirus / Enterovirus NOT DETECTED NOT DETECTED Final   Influenza A NOT DETECTED NOT DETECTED Final   Influenza B NOT DETECTED NOT DETECTED Final   Parainfluenza Virus 1 NOT DETECTED NOT DETECTED Final   Parainfluenza Virus 2 NOT DETECTED NOT DETECTED Final   Parainfluenza Virus 3 NOT DETECTED NOT DETECTED Final   Parainfluenza Virus 4 NOT DETECTED NOT DETECTED Final   Respiratory Syncytial Virus NOT DETECTED NOT DETECTED Final   Bordetella pertussis NOT DETECTED NOT DETECTED Final   Bordetella Parapertussis NOT DETECTED NOT DETECTED Final   Chlamydophila pneumoniae NOT DETECTED NOT DETECTED Final   Mycoplasma pneumoniae NOT DETECTED NOT DETECTED  Final    Comment: Performed at [facility], [address] [place], [place] [address]  MRSA Next Gen by PCR, Nasal     Status: None   Collection Time: 06/19/22  4:15 PM   Specimen: Nasal Mucosa; Nasal Swab  Result Value Ref Range Status   MRSA by PCR Next Gen NOT DETECTED NOT DETECTED Final    Comment: (NOTE) The GeneXpert MRSA Assay (FDA approved for NASAL specimens only), is one component of a comprehensive MRSA colonization surveillance program. It is not intended to diagnose MRSA infection nor to guide or monitor treatment for MRSA infections. Test performance is not FDA approved in patients less than 36 years old. Performed at [facility], [address], [place], [place] [address]     Antimicrobials: Anti-infectives (From admission, onward)    Start     Dose/Rate Route Frequency Ordered Stop   06/28/22 0600  vancomycin (VANCOCIN) IVPB 1000 mg/200 mL premix  Status:  Discontinued        1,000 mg 200 mL/hr over 60 Minutes Intravenous On call to O.R. 06/27/22 1002 06/27/22 1023   06/24/22 1615  vancomycin (VANCOCIN) IVPB 1000 mg/200 mL premix        1,000 mg 200 mL/hr over 60 Minutes Intravenous  Once 06/24/22 1516 06/24/22 1729   06/21/22 1100  doxycycline (VIBRA-TABS) tablet 100 mg        100 mg Oral Every 12 hours 06/21/22 1002 06/26/22 0959   06/19/22 2300  ceFEPIme (MAXIPIME) 2 g in sodium chloride 0.9 % 100 mL IVPB  Status:  Discontinued        2 g 200 mL/hr over 30 Minutes Intravenous Every 24 hours 06/19/22 0156 06/21/22 1002   06/19/22 0200  vancomycin (VANCOREADY) IVPB 1500 mg/300 mL        1,500 mg 150 mL/hr over 120 Minutes Intravenous  Once 06/19/22 0156 06/19/22 0622   06/19/22 0157  vancomycin variable dose per unstable renal function (pharmacist dosing)  Status:  Discontinued         Does not apply See admin instructions 06/19/22 0157 06/20/22 1123   06/18/22 2330  ceFEPIme (MAXIPIME) 2 g in sodium chloride 0.9 % 100 mL IVPB        2 g 200 mL/hr over 30 Minutes  Intravenous  Once 06/18/22 2317 06/19/22 0007   06/18/22 2315  vancomycin (VANCOCIN) IVPB 1000 mg/200 mL premix        1,000 mg 200 mL/hr over 60 Minutes Intravenous  Once 06/18/22 2306 06/19/22 0203      Culture/Microbiology    Component Value Date/Time   SDES  06/18/2022 2334    BLOOD BLOOD LEFT ARM Performed at [facility], [address], [place], [place] [address]    SPECREQUEST  06/18/2022 2334    BOTTLES DRAWN AEROBIC AND ANAEROBIC Blood Culture adequate volume Performed at [facility], [address] [place], [place] [address]    CULT  06/18/2022 2334    NO GROWTH 5 DAYS Performed at [facility], [address] [place], [place] [address]    REPTSTATUS 06/24/2022 FINAL 06/18/2022 2334  Other culture-see note  Radiology Studies: No results found.  LOS: 9 days  Lanae Boast, MD [facility]  06/28/2022, 11:57 AM

## 2022-06-29 DIAGNOSIS — J189 Pneumonia, unspecified organism: Secondary | ICD-10-CM | POA: Diagnosis not present

## 2022-06-29 LAB — GLUCOSE, CAPILLARY
Glucose-Capillary: 164 mg/dL — ABNORMAL HIGH (ref 70–99)
Glucose-Capillary: 180 mg/dL — ABNORMAL HIGH (ref 70–99)
Glucose-Capillary: 247 mg/dL — ABNORMAL HIGH (ref 70–99)
Glucose-Capillary: 156 mg/dL — ABNORMAL HIGH (ref 70–99)

## 2022-06-29 LAB — TYPE AND SCREEN
Donor AG Type: NEGATIVE
Donor AG Type: NEGATIVE
Unit division: 0

## 2022-06-29 LAB — CULTURE, BLOOD (ROUTINE X 2): Culture: NO GROWTH

## 2022-06-29 LAB — CBC
HCT: 25.3 % — ABNORMAL LOW (ref 36.0–46.0)
Hemoglobin: 7.8 g/dL — ABNORMAL LOW (ref 12.0–15.0)
MCH: 27.3 pg (ref 26.0–34.0)
MCHC: 30.8 g/dL (ref 30.0–36.0)
MCV: 88.5 fL (ref 80.0–100.0)
Platelets: 313 10*3/uL (ref 150–400)
RBC: 2.86 MIL/uL — ABNORMAL LOW (ref 3.87–5.11)
RDW: 20.2 % — ABNORMAL HIGH (ref 11.5–15.5)
WBC: 14.8 10*3/uL — ABNORMAL HIGH (ref 4.0–10.5)
nRBC: 0 % (ref 0.0–0.2)

## 2022-06-29 LAB — APTT: aPTT: 58 seconds — ABNORMAL HIGH (ref 24–36)

## 2022-06-29 LAB — HEPARIN LEVEL (UNFRACTIONATED): Heparin Unfractionated: 0.89 IU/mL — ABNORMAL HIGH (ref 0.30–0.70)

## 2022-06-29 MED ORDER — CHLORHEXIDINE GLUCONATE CLOTH 2 % EX PADS: 6.0000 | MEDICATED_PAD | Freq: Every day | CUTANEOUS | Status: DC

## 2022-06-29 MED ORDER — INSULIN GLARGINE-YFGN 100 UNIT/ML ~~LOC~~ SOLN
8.0000 [IU] | Freq: Every day | SUBCUTANEOUS | Status: DC
  Administered 2022-06-30: 8 [IU] via SUBCUTANEOUS
  Filled 2022-06-29 (×3): qty 0.08

## 2022-06-29 NOTE — Plan of Care (Signed)
  Problem: Respiratory: Goal: Ability to maintain adequate ventilation will improve Outcome: Progressing Goal: Ability to maintain a clear airway will improve Outcome: Progressing   Problem: Clinical Measurements: Goal: Respiratory complications will improve Outcome: Progressing Goal: Cardiovascular complication will be avoided Outcome: Progressing   Problem: Elimination: Goal: Will not experience complications related to bowel motility Outcome: Progressing Goal: Will not experience complications related to urinary retention Outcome: Progressing   Problem: Activity: Goal: Risk for activity intolerance will decrease Outcome: Not Progressing   Problem: Nutrition: Goal: Adequate nutrition will be maintained Outcome: Not Progressing   Problem: Pain Managment: Goal: General experience of comfort will improve Outcome: Not Progressing

## 2022-06-29 NOTE — Progress Notes (Signed)
PROGRESS NOTE Deborah [name]  HQI:[id number] DOB: 24-Dec-1965 DOA: 06/18/2022 PCP: Elsie Amis, MD  Brief Narrative/Hospital Course: 57 y.o.f w/ medical history significant for insulin-dependent diabetes mellitus, hypertension, PAF on Eliquis, CKD stage IV, iron deficiency anemia, chronic pain, duodenal carcinoid tumor resected in 2021 and recent admission 05/28/22 -06/04/22 with diagnosis of acute metabolic encephalopathy due to dehydration with associated AKI on CKDIV, with course complicated by disseminated gout and discharged on colchine, Uloric  and allopurinol as well as steroid taper to SNF presented with worsening shortness of breath, fluid overload past 24 hours In the XL:[id number] has revealed multiple opacities on chest x-ray, COVID-19 is negative and respiratory viral panel is positive for metapneumovirus infection.  Placed on empiric antibiotics, seen by nephrology for fluid overload and placed on high-dose Lasix without improvement. Subsequently transferred to [facility] 4/18 for dialysis and access Underwent right IJ tunneled HD catheter placement by IR 4/18 Seen by Dr. Lenell Antu from vascular for HD access advised supportive left upper extremity from further venipuncture and awaiting vein mapping   Subjective: Seen and examined Complains of abdominal pain which is a chronic problem and takes Dilaudid for 4 times a day at home-at times has to increase the dose Patient had temperature spike 101.3 4/22 AM and febrile since, BP stable 98-1 38, not hypoxic Labs reviewed this morning hemoglobin 7.8 g Wt :343 lb  on 4/17> 317 lb 4/21> 322> 317 lb  feels tired   Assessment and Plan: Principal Problem:   HCAP (healthcare-associated pneumonia)  Metapneumovirus multifocal pneumonia right more than left Fever and leukocytosis: Initially received vancomycin and cefepime,blood culture no growth to date transitioned to doxycycline and completed. Continue to provide respiratory  support withOOB, IS  bronchodilators as needed.  Fever 4/20 2 AM blood cultures has been sent no growth so far, continue to monitor temperature curve, WBC count.  Recent Labs  Lab 06/25/22 0759 06/26/22 0316 06/27/22 0225 06/28/22 0255 06/29/22 0401  WBC 13.5* 13.2* 13.3* 14.0* 14.8*    Acute on chronic kidney disease stage IV Bilateral lower extremity edema: B/L creat 2.1-3.1 from March 2023>4.8 creatinine on admission.  AKI in the setting of metapneumovirus respiratory infection and with fluid overload.UA unremarkable CT abdomen without obstruction, placed on high-dose Lasix without improvement in leg edema despite good urine output >subsequently s/p tunneled HD placement and dialysis initiated 4/19.  >Continue HD per nephrology >VVS following for access>  for 4/24-Eliquis has been held and placed on heparin infusion  >Avoid nephrotoxic medications including NSAIDs and iodinated intravenous contrast exposure unless the latter is absolutely indicated.Preferred narcotic agents for pain control UVO:[id number], fentanyl, and methadone and avoid morphine.Avoid Baclofen and avoid oral sodium phosphate and magnesium citrate based laxatives / bowel preps. Continue strict Input and Output monitoring and serial renal functions.  She is on renal diet but was very upset given limitation in her diet and changed to regular diet-with instruction for low-salt low potassium and fluid restriction Net IO Since Admission: -9,946.04 mL [06/29/22 1251]  Filed Weights   06/26/22 2002 06/28/22 0811 06/28/22 1203  Weight: (!) 143.8 kg (!) 146.4 kg (!) 144.1 kg   Hypokalemia:adjsut w/ HD Metabolic acidosis in the setting of CKD and AKI monitor. Hco3 better   Hypertension Chronic CHF with preserved EF: Bp stable.Fluid management  w/ HD by nephro and continue antihypertensives coreg  Atrial fibrillation rate controlled on Coreg, changed Eliquis> heparin drip 4/21 preop> transition back to Eliquis post  op.  Anemia multifactorial from IDA and CKD:  Hemoglobin stable but low. Cont iron supplement, folate and monitor Pt had 2 u prbc done on 4/13.  Transfuse if less than 7 Recent Labs  Lab 06/25/22 0759 06/26/22 0316 06/27/22 0225 06/28/22 0255 06/29/22 0401  HGB 7.4* 7.8* 7.5* 7.7* 7.8*  HCT 23.7* 23.1* 23.9* 23.9* 25.3*    Hyperlipidemia continue statin  Chronic pain on abdomen Left-sided abdominal pain Lumbar stenosis: Underwent CT renal study without obvious acute finding, has diverticulosis.  LFTs are stable.  Lipase mildly elevated but in the setting of CKD AKI.  Cont pain management and stool softener.  She takes Dilaudid 2 mg every 8 hours as needed reports at times she has been doubling the dose if she has worsening pain at home and is chronic.Requesting IV narcotics, discussed about minimizing opiates for chronic pain   Hyperuricemia/gout with recent flare of and disseminated into hyperuricemia and encephalopathy: Uric acid now down to 8, was 16.5 at previous admission.  Continue colchicine, allopurinol. completed prednisone taper previously. Continue on colchicine and uloric.  Type 2 diabetes mellitus history of HHS: HbA1c poorly controlled 8.1 blood sugar borderline controlled now on Semglee 14 u qhs> decrease to 8 units as she will be n.p.o. past midnight, cont Premeal and SSI , monitor and adjust Recent Labs  Lab 06/28/22 1230 06/28/22 1701 06/28/22 2005 06/29/22 0726 06/29/22 1124  GLUCAP 133* 296* 298* 164* 180*    Morbid Obesity:Patient's Body mass index is 51.28 kg/m. : Will benefit with PCP follow-up, weight loss  healthy lifestyle and outpatient sleep evaluation.  DVT prophylaxis: Eliquis Code Status:   Code Status: Full Code Family Communication: plan of care discussed with patient at bedside. Patient status is: Inpatient because of need for dialysis Level of care: Telemetry Medical   Dispo: The patient is from: SNF            Anticipated disposition: SNF  TBD Objective: Vitals last 24 hrs: Vitals:   06/28/22 1703 06/28/22 2001 06/29/22 0442 06/29/22 0957  BP: 118/77 98/60 117/62 (!) 110/59  Pulse: (!) 103 99 96 96  Resp: 18 18 18 17   Temp: 98.4 F (36.9 C) 98.8 F (37.1 C) 97.8 F (36.6 C) 98.4 F (36.9 C)  TempSrc: Oral Oral Oral   SpO2: 94% 100% 97% 100%  Weight:      Height:       Weight change:   Physical Examination: General exam: AAOX3, OBESE weak,older appearing HEENT:Oral mucosa moist, Ear/Nose WNL grossly, dentition normal. Respiratory system: bilaterally diminished BS, no use of accessory muscle Cardiovascular system: S1 & S2 +, regular rate,. HD catheter+ Gastrointestinal system: Abdomen soft, obese NT,ND,BS+ Nervous System:Alert, awake, moving extremities and grossly nonfocal Extremities: LE ankle edema + chronic appearing Skin: No rashes,no icterus. MSK: Normal muscle bulk,tone, power  Medications reviewed: Scheduled Meds:  allopurinol  50 mg Oral Once per day on Sun Wed   atorvastatin  10 mg Oral Daily   carvedilol  12.5 mg Oral BID WC   Chlorhexidine Gluconate Cloth  6 each Topical Daily   Chlorhexidine Gluconate Cloth  6 each Topical Q0600   darbepoetin (ARANESP) injection - NON-DIALYSIS  40 mcg Subcutaneous Q Mon-1800   donepezil  5 mg Oral QHS   febuxostat  40 mg Oral Daily   ferrous sulfate  325 mg Oral BID WC   folic acid  1 mg Oral Daily   guaiFENesin  600 mg Oral BID   insulin aspart  0-5 Units Subcutaneous QHS   insulin aspart  0-6 Units  Subcutaneous TID WC   insulin aspart  4 Units Subcutaneous TID WC   insulin glargine-yfgn  8 Units Subcutaneous QHS   mirtazapine  30 mg Oral QHS   pantoprazole  40 mg Oral BID  Continuous Infusions:  heparin 1,600 Units/hr (06/29/22 4098)     Diet Order             Diet NPO time specified  Diet effective midnight           Diet regular Fluid consistency: Thin; Fluid restriction: Other (see comments)  Diet effective now                   Intake/Output Summary (Last 24 hours) at 06/29/2022 1251 Last data filed at 06/29/2022 0801 Gross per 24 hour  Intake 600 ml  Output 450 ml  Net 150 ml   Net IO Since Admission: -9,946.04 mL [06/29/22 1251]  Wt Readings from Last 3 Encounters:  06/28/22 (!) 144.1 kg  06/04/22 130.5 kg  05/13/22 (!) 139.2 kg     Unresulted Labs (From admission, onward)     Start     Ordered   06/30/22 0500  APTT  Daily,   R     Question:  Specimen collection method  Answer:  Lab=Lab collect   06/29/22 0521   06/30/22 0500  Heparin level (unfractionated)  Daily,   R     Question:  Specimen collection method  Answer:  Lab=Lab collect   06/29/22 0522   06/26/22 0500  CBC  Daily,   R      06/25/22 0819          Data Reviewed: I have personally reviewed following labs and imaging studies CBC: Recent Labs  Lab 06/25/22 0759 06/26/22 0316 06/27/22 0225 06/28/22 0255 06/29/22 0401  WBC 13.5* 13.2* 13.3* 14.0* 14.8*  HGB 7.4* 7.8* 7.5* 7.7* 7.8*  HCT 23.7* 23.1* 23.9* 23.9* 25.3*  MCV 86.8 83.1 86.6 85.7 88.5  PLT 216 231 220 236 313   Basic Metabolic Panel: Recent Labs  Lab 06/24/22 0516 06/25/22 0759 06/26/22 0316 06/27/22 0225 06/28/22 0255  NA 136 138 133* 134* 131*  K 3.1* 3.3* 3.0* 3.2* 3.3*  CL 107 108 102 97* 96*  CO2 16* 14* 20* 24 22  GLUCOSE 176* 159* 195* 202* 213*  BUN 110* 103* 62* 36* 43*  CREATININE 5.21* 5.23* 4.14* 3.85* 4.80*  CALCIUM 8.3* 8.3* 8.1* 8.5* 8.6*  PHOS  --  8.1*  --   --   --    GFR: Estimated Creatinine Clearance: 18.8 mL/min (A) (by C-G formula based on SCr of 4.8 mg/dL (H)). Liver Function Tests: Recent Labs  Lab 06/25/22 0759  ALBUMIN 2.0*   No results for input(s): "LIPASE", "AMYLASE" in the last 168 hours. CBG: Recent Labs  Lab 06/28/22 1230 06/28/22 1701 06/28/22 2005 06/29/22 0726 06/29/22 1124  GLUCAP 133* 296* 298* 164* 180*   No results for input(s): "PROCALCITON", "LATICACIDVEN" in the last 168 hours.   Recent  Results (from the past 240 hour(s))  Respiratory (~20 pathogens) panel by PCR     Status: Abnormal   Collection Time: 06/19/22  4:15 PM   Specimen: Nasopharyngeal Swab; Respiratory  Result Value Ref Range Status   Adenovirus NOT DETECTED NOT DETECTED Final   Coronavirus 229E NOT DETECTED NOT DETECTED Final    Comment: (NOTE) The Coronavirus on the Respiratory Panel, DOES NOT test for the novel  Coronavirus (2019 nCoV)    Coronavirus HKU1 NOT DETECTED  NOT DETECTED Final   Coronavirus NL63 NOT DETECTED NOT DETECTED Final   Coronavirus OC43 NOT DETECTED NOT DETECTED Final   Metapneumovirus DETECTED (A) NOT DETECTED Final   Rhinovirus / Enterovirus NOT DETECTED NOT DETECTED Final   Influenza A NOT DETECTED NOT DETECTED Final   Influenza B NOT DETECTED NOT DETECTED Final   Parainfluenza Virus 1 NOT DETECTED NOT DETECTED Final   Parainfluenza Virus 2 NOT DETECTED NOT DETECTED Final   Parainfluenza Virus 3 NOT DETECTED NOT DETECTED Final   Parainfluenza Virus 4 NOT DETECTED NOT DETECTED Final   Respiratory Syncytial Virus NOT DETECTED NOT DETECTED Final   Bordetella pertussis NOT DETECTED NOT DETECTED Final   Bordetella Parapertussis NOT DETECTED NOT DETECTED Final   Chlamydophila pneumoniae NOT DETECTED NOT DETECTED Final   Mycoplasma pneumoniae NOT DETECTED NOT DETECTED Final    Comment: Performed at [facility], [address] [place], [place] [address]  MRSA Next Gen by PCR, Nasal     Status: None   Collection Time: 06/19/22  4:15 PM   Specimen: Nasal Mucosa; Nasal Swab  Result Value Ref Range Status   MRSA by PCR Next Gen NOT DETECTED NOT DETECTED Final    Comment: (NOTE) The GeneXpert MRSA Assay (FDA approved for NASAL specimens only), is one component of a comprehensive MRSA colonization surveillance program. It is not intended to diagnose MRSA infection nor to guide or monitor treatment for MRSA infections. Test performance is not FDA approved in patients less than 23  years old. Performed at [facility], [address], [place], [place] [address]   Culture, blood (Routine X 2) w Reflex to ID Panel     Status: None (Preliminary result)   Collection Time: 06/28/22  7:01 AM   Specimen: BLOOD RIGHT ARM  Result Value Ref Range Status   Specimen Description BLOOD RIGHT ARM  Final   Special Requests   Final    BOTTLES DRAWN AEROBIC AND ANAEROBIC Blood Culture adequate volume   Culture   Final    NO GROWTH < 24 HOURS Performed at [facility], [address], [place], [place] [address]    Report Status PENDING  Incomplete  Culture, blood (Routine X 2) w Reflex to ID Panel     Status: None (Preliminary result)   Collection Time: 06/28/22  7:03 AM   Specimen: BLOOD  Result Value Ref Range Status   Specimen Description BLOOD RIGHT ANTECUBITAL  Final   Special Requests   Final    BOTTLES DRAWN AEROBIC ONLY Blood Culture results may not be optimal due to an inadequate volume of blood received in culture bottles   Culture   Final    NO GROWTH < 24 HOURS Performed at [facility], [address], [place], [place] [address]    Report Status PENDING  Incomplete    Antimicrobials: Anti-infectives (From admission, onward)    Start     Dose/Rate Route Frequency Ordered Stop   06/28/22 0600  vancomycin (VANCOCIN) IVPB 1000 mg/200 mL premix  Status:  Discontinued        1,000 mg 200 mL/hr over 60 Minutes Intravenous On call to O.R. 06/27/22 1002 06/27/22 1023   06/24/22 1615  vancomycin (VANCOCIN) IVPB 1000 mg/200 mL premix        1,000 mg 200 mL/hr over 60 Minutes Intravenous  Once 06/24/22 1516 06/24/22 1729   06/21/22 1100  doxycycline (VIBRA-TABS) tablet 100 mg  100 mg Oral Every 12 hours 06/21/22 1002 06/26/22 0959   06/19/22 2300  ceFEPIme (MAXIPIME) 2 g in sodium chloride 0.9 % 100 mL IVPB  Status:  Discontinued        2 g 200 mL/hr over 30 Minutes Intravenous Every 24 hours 06/19/22 0156 06/21/22 1002   06/19/22  0200  vancomycin (VANCOREADY) IVPB 1500 mg/300 mL        1,500 mg 150 mL/hr over 120 Minutes Intravenous  Once 06/19/22 0156 06/19/22 0622   06/19/22 0157  vancomycin variable dose per unstable renal function (pharmacist dosing)  Status:  Discontinued         Does not apply See admin instructions 06/19/22 0157 06/20/22 1123   06/18/22 2330  ceFEPIme (MAXIPIME) 2 g in sodium chloride 0.9 % 100 mL IVPB        2 g 200 mL/hr over 30 Minutes Intravenous  Once 06/18/22 2317 06/19/22 0007   06/18/22 2315  vancomycin (VANCOCIN) IVPB 1000 mg/200 mL premix        1,000 mg 200 mL/hr over 60 Minutes Intravenous  Once 06/18/22 2306 06/19/22 0203      Culture/Microbiology    Component Value Date/Time   SDES BLOOD RIGHT ANTECUBITAL 06/28/2022 0703   SPECREQUEST  06/28/2022 0703    BOTTLES DRAWN AEROBIC ONLY Blood Culture results may not be optimal due to an inadequate volume of blood received in culture bottles   CULT  06/28/2022 0703    NO GROWTH < 24 HOURS Performed at [facility], [address], [place], [place] [address]    REPTSTATUS PENDING 06/28/2022 0703  Other culture-see note  Radiology Studies: No results found.  LOS: 10 days  Lanae Boast, MD [facility]  06/29/2022, 12:51 PM

## 2022-06-29 NOTE — Progress Notes (Signed)
Admit: 06/18/2022 LOS: 10  89F AoCKD4, multifocal pneumonia likley 2/2 metapneumovirus, recent admission for disseminated gout with encephalopathy and AKI, just discharged from SNF.   Subjective:   Last HD on 4/22 with 2.3 kg UF.   She had 450 mL UOP over 4/22 charted.  She had no labs today.  She states that she feels wiped out after dialysis and that she has had abdominal cramping after HD.    Review of systems:    Denies n/v [name] is better Denies chest pain  Her Legs hurt     Physical Exam:  Blood pressure (!) 110/59, pulse 96, temperature 98.4 F (36.9 C), resp. rate 17, height  (1.676 m), weight (!) 144.1 kg, last menstrual period 10/10/2012, SpO2 100 %. General adult female in bed in no acute distress but uncomfortable   HEENT normocephalic atraumatic extraocular movements intact sclera anicteric Neck supple trachea midline Lungs clear to auscultation bilaterally normal work of breathing at rest on room air   Heart S1S2 no rub Abdomen soft nontender nondistended Extremities diffuse 3+ edema lower extremities  Psych normal mood and affect Neuro - alert and oriented x 3 provides hx and follows commands  GU - foley catheter with urine  Access RIJ tunn catheter    Home meds - zyloprim, albuterol, norvasc 10, eliquis, lipitor, coreg 12.5 bid, colchicine, aricept, pepcid, hydralazine 50 tid, insulin aspart/ glargine/ lispro, remeron, mVI, sod bicarb, torsemide 20 qd, uloric, protonix, prns    Date   Creat  eGFR  2018-2020  1.01- 1.39   2021   1.31- 2.70  2022   1.64- 4.38  2023   1.72- 6.25 Mar 2022  3.02- 5.93 8- 17 ml/min  March 2024  1.92- 4.59 10-30 ml/min  4/12   4.84  10 ml/min    4/13   4.82  4/14   4.83  06/20/21  4.99  9 ml/min     B/l creat 2.1- 3.1 from march 2023, eGFR 17- 28 ml/ min   UA 11-20 wbc, 5-10 rbc from 06/19/22, prot 30   CT abd renal --> normal appearing kidneys w/o obstruction    Assessment/ Plan AoCKD4 with progression to  ESRD Baseline creat 2.4 - 3 from march 2024.  Creat here 4.8 on admission in setting of MPV resp infection and lower ext edema. Followed by Dr Thedore Mins at [facility]. UA unremarkable, CT abd w/o obstruction.  Per charting, Lasix started at 40 mg bid IV then titrated up to  IV tid but no sig improvement in wt's or edema since admission. Pt's brother (esrd on HD) called and discussed with team that her QOL is "horrible" and has been for the last couple of years due to her chronic fluid overload issues and he doesn't see her getting better. He felt she would do better on dialysis. Nephrology d/w the patient and she acknowledged very difficult times lately due to chronic fluid overload refractory to diuretics.  TDC placed by IR and pt had her 1st HD Friday 4/19.  VVS is planning on permanent access - this was rescheduled for Wed, 4/24.  Appreciate their assistance Next HD on 4/24 - after access/coordinate around access surgery  I have started the CLIP process for outpatient HD unit placement  Ordered renal panel for tomorrow  She has had quite a bit of fluid removed with HD - try for gentle treatment tomorrow to see if she tolerates better.    Metapneumovirus - admit diagnosis, w/ multifocal pneumonia  R > L, sp IV vanc + cefepime, BCx NGTD. s/p po doxy.  Fluid volume overload - improving now w/ dialysis Hypertension - controlled; optimize volume with HD.  She is on coreg and blood pressure is not low but she feels poorly after HD.  If BP drops may need to lower the coreg.   Gout - with severe hyperuricemia; per primary team  Anemia of CKD -   transfuse per TRH.  Started aranesp 40 mcg every Monday   HFpEF - optimize volume with HD  DM2 with history of HHS.  Per primary team  Atrial fibrillation - per primary team.  Note that her only BP agent is also directed at her afib   Disposition - continue inpatient monitoring.  Needs outpatient HD unit   Recent Labs  Lab 06/25/22 0759 06/26/22 0316 06/27/22 0225  06/28/22 0255 06/29/22 0401  HGB 7.4*   < > 7.5* 7.7* 7.8*  ALBUMIN 2.0*  --   --   --   --   CALCIUM 8.3*   < > 8.5* 8.6*  --   PHOS 8.1*  --   --   --   --   CREATININE 5.23*   < > 3.85* 4.80*  --   K 3.3*   < > 3.2* 3.3*  --    < > = values in this interval not displayed.    Inpatient medications:  allopurinol  50 mg Oral Once per day on Sun Wed   atorvastatin  10 mg Oral Daily   carvedilol  12.5 mg Oral BID WC   Chlorhexidine Gluconate Cloth  6 each Topical Daily   Chlorhexidine Gluconate Cloth  6 each Topical Q0600   darbepoetin (ARANESP) injection - NON-DIALYSIS  40 mcg Subcutaneous Q Mon-1800   donepezil  5 mg Oral QHS   febuxostat  40 mg Oral Daily   ferrous sulfate  325 mg Oral BID WC   folic acid  1 mg Oral Daily   guaiFENesin  600 mg Oral BID   insulin aspart  0-5 Units Subcutaneous QHS   insulin aspart  0-6 Units Subcutaneous TID WC   insulin aspart  4 Units Subcutaneous TID WC   insulin glargine-yfgn  8 Units Subcutaneous QHS   mirtazapine  30 mg Oral QHS   pantoprazole  40 mg Oral BID    heparin 1,600 Units/hr (06/29/22 1517)    albuterol, guaiFENesin, HYDROmorphone, loperamide, ondansetron **OR** ondansetron (ZOFRAN) IV, mouth rinse    Estanislado Emms, MD 4:35 PM 06/29/2022

## 2022-06-29 NOTE — TOC Progression Note (Signed)
Transition of Care [facility]) - Progression Note    Patient Details  Name: Deborah [name] MRN: [id number] Date of Birth: 01-23-1966  [facility] CM/SW Contact  Tom-Johnson, Hershal Coria, RN Phone Number: 06/29/2022, 9:01 PM  Clinical Narrative:     Patient is a new dialysis patient. Had right IJ Tunneled HD cath placed on 06/25/22 with first HD treatment. Awaits Vein mapping for outpatient dialysis. Clipping underway by Renal Navigator. Has scheduled Fistula placement tomorrow 06/30/22. Has foley cath. Completed abx.   WOC following for Lt heel and sacral wound. No PT/OT f/u noted.    CM will continue to follow as patient progresses with care towards discharge.         Expected Discharge Plan: Home/Self Care Barriers to Discharge: Continued Medical Work up  Expected Discharge Plan and Services   Discharge Planning Services: CM Consult   Living arrangements for the past 2 months: Apartment                                       Social Determinants of Health (SDOH) Interventions SDOH Screenings   Food Insecurity: No Food Insecurity (06/21/2022)  Recent Concern: Food Insecurity - Food Insecurity Present (05/29/2022)  Housing: Low Risk  (06/21/2022)  Recent Concern: Housing - High Risk (05/29/2022)  Transportation Needs: No Transportation Needs (06/21/2022)  Recent Concern: Transportation Needs - Unmet Transportation Needs (06/19/2022)  Utilities: Not At Risk (06/21/2022)  Recent Concern: Utilities - At Risk (06/19/2022)  Depression (PHQ2-9): Medium Risk (09/16/2021)  Tobacco Use: Low Risk  (06/24/2022)    Readmission Risk Interventions     No data to display

## 2022-06-29 NOTE — Progress Notes (Signed)
ANTICOAGULATION CONSULT NOTE - follow-up  Pharmacy Consult for heparin Indication: atrial fibrillation  Allergies  Allergen Reactions   Diazepam [name]   Gabapentin [name] and Swelling    Other reaction(s): Unknown   Iodinated Contrast Media Anaphylaxis    11/29/17 Cardiac arrest 1 min after IV contrast, possible allergy vs vasovagal episode Iopamidol  Anaphylaxis  High 11/28/2017  Patient had seizure like activity and then code post 100 cc of isovue 300     Isovue [Iopamidol] Anaphylaxis    11/28/17 Patient had seizure like activity and then 1 min code after 100 cc of isovue 300. Possible contrast allergy vs vasovagal episode   Lisinopril Anaphylaxis    Tongue and mouth swelling   Metoclopramide Other (See Comments)    Tardive dyskinesia  Also known as Reglan    Nsaids Anaphylaxis and Other (See Comments)    ULCER   Penicillins Palpitations    Has patient had a PCN reaction causing immediate rash, facial/tongue/throat swelling, SOB or lightheadedness with hypotension: Yes, heart races Has patient had a PCN reaction causing severe rash involving mucus membranes or skin necrosis: No Has patient had a PCN reaction that required hospitalization: Yes  Has patient had a PCN reaction occurring within the last 10 years: No    Tolmetin Nausea Only and Other (See Comments)    ULCER   Dicyclomine Other (See Comments)    Chest pain   Acetaminophen Nausea Only and Other (See Comments)    Irritates stomach ulcer; Abdominal pain   Cyclobenzaprine Palpitations   Oxycodone Palpitations   Rifamycins Palpitations   Tramadol Nausea And Vomiting    Patient Measurements: Height:  (167.6 cm) Weight: (!) 144.1 kg (317 lb 10.9 oz) (BED) IBW/kg (Calculated) : 59.3 Heparin Dosing Weight: 94.8 kg    Vital Signs: Temp: 97.8 F (36.6 C) (04/23 0442) Temp Source: Oral (04/23 0442) BP: 117/62 (04/23 0442) Pulse Rate: 96 (04/23 0442)  Labs: Recent Labs     06/27/22 0225 06/27/22 1111 06/28/22 0255 06/28/22 2010 06/29/22 0401  HGB 7.5*  --  7.7*  --  7.8*  HCT 23.9*  --  23.9*  --  25.3*  PLT 220  --  236  --  313  APTT  --  31  --  74* 58*  HEPARINUNFRC  --  >1.10*  --  0.99* 0.89*  CREATININE 3.85*  --  4.80*  --   --      Estimated Creatinine Clearance: 18.8 mL/min (A) (by C-G formula based on SCr of 4.8 mg/dL (H)).   Medical History: Past Medical History:  Diagnosis Date   Acute back pain with sciatica, left    Acute back pain with sciatica, right    AKI (acute kidney injury)    Anemia, unspecified    Cancer    Carcinoid tumor of duodenum    Chest pain with normal coronary angiography 2019   Chronic kidney disease, stage 3b    Chronic pain    Chronic systolic CHF (congestive heart failure)    Diabetes mellitus    DKA (diabetic ketoacidosis)    Drug-seeking behavior    21 hospitalizations and 14 CT a/p in 2 years for N/V and abdominal pain, demanding only IV dilaudid   Elevated troponin    chronic   Esophageal reflux    Fibromyalgia    Gastric ulcer    Gastroparesis    Gout    Hyperlipidemia    Hypertension    Hypokalemia  Hypomagnesemia    Lumbosacral stenosis    LVH (left ventricular hypertrophy)    Morbid obesity    NICM (nonischemic cardiomyopathy)    PAF (paroxysmal atrial fibrillation)    Stroke 02/2011   Thrombocytosis    Vitamin B12 deficiency anemia     Medications:   Scheduled:   acetaminophen  650 mg Oral STAT   allopurinol  50 mg Oral Once per day on Sun Wed   atorvastatin  10 mg Oral Daily   carvedilol  12.5 mg Oral BID WC   Chlorhexidine Gluconate Cloth  6 each Topical Daily   Chlorhexidine Gluconate Cloth  6 each Topical Q0600   darbepoetin (ARANESP) injection - NON-DIALYSIS  40 mcg Subcutaneous Q Mon-1800   donepezil  5 mg Oral QHS   febuxostat  40 mg Oral Daily   ferrous sulfate  325 mg Oral BID WC   folic acid  1 mg Oral Daily   guaiFENesin  600 mg Oral BID   insulin aspart   0-5 Units Subcutaneous QHS   insulin aspart  0-6 Units Subcutaneous TID WC   insulin aspart  4 Units Subcutaneous TID WC   insulin glargine-yfgn  14 Units Subcutaneous QHS   mirtazapine  30 mg Oral QHS   pantoprazole  40 mg Oral BID    Assessment: Patient is a 57 yof admitted for HCAP. Patient has a history of a fib and is on Eliquis 5 mg BID PTA and this has been continued while inpatient, last dose 4/21 at 840. Patient is to have an AV fistula VS graft on Wednesday 4/24 thus pharmacist has been consulted to transition from Eliquis to heparin in the perioperative setting   Platelets have been stable and wnl, hemoglobin low but stable, today at 7.8. Patient has ESRD and is on HD. Will monitor heparin with aPTT until Eliquis is out of her system and can use a HL.   aPTT is SUBtherapeutic this am at 58. No issues with infusion or bleeding per RN.  Goal of Therapy:  Heparin level 0.3-0.7 units/ml aPTT 66-102 seconds Monitor platelets by anticoagulation protocol: Yes   Plan:  Increase heparin to 1600 units/hour F/u 8 hour aPTT Daily heparin level and aPTT until correlation  CBC daily while on heparin, f/u signs of bleeding  F/u post operative status to resume oral Eliquis    Thank you for allowing [place] to participate in this patients care. Signe Colt, PharmD 06/29/2022 4:57 AM  **Pharmacist phone directory can be found on [email or web address] listed under [facility]**

## 2022-06-29 NOTE — Progress Notes (Signed)
  Progress Note    06/29/2022 7:52 AM * No surgery date entered *  Subjective:  feels very tired. No fevers since yesterday morning    Vitals:   06/28/22 2001 06/29/22 0442  BP: 98/60 117/62  Pulse: 99 96  Resp: 18 18  Temp: 98.8 F (37.1 C) 97.8 F (36.6 C)  SpO2: 100% 97%    Physical Exam: Cardiac:  regular Lungs:  nonlabored Extremities:  palpable radial and brachial pulses bilaterally   CBC    Component Value Date/Time   WBC 14.8 (H) 06/29/2022 0401   RBC 2.86 (L) 06/29/2022 0401   HGB 7.8 (L) 06/29/2022 0401   HGB 8.8 (L) 10/09/2019 0906   HGB 9.6 (L) 09/03/2019 1620   HCT 25.3 (L) 06/29/2022 0401   HCT 30.4 (L) 09/03/2019 1620   PLT 313 06/29/2022 0401   PLT 348 10/09/2019 0906   PLT 451 (H) 09/03/2019 1620   MCV 88.5 06/29/2022 0401   MCV 85 09/03/2019 1620   MCH 27.3 06/29/2022 0401   MCHC 30.8 06/29/2022 0401   RDW 20.2 (H) 06/29/2022 0401   RDW 13.6 09/03/2019 1620   LYMPHSABS 2.0 06/03/2022 0432   LYMPHSABS 2.2 09/03/2019 1620   MONOABS 1.0 06/03/2022 0432   EOSABS 0.0 06/03/2022 0432   EOSABS 0.3 09/03/2019 1620   BASOSABS 0.0 06/03/2022 0432   BASOSABS 0.1 09/03/2019 1620    BMET    Component Value Date/Time   NA 131 (L) 06/28/2022 0255   NA 137 09/20/2019 1530   K 3.3 (L) 06/28/2022 0255   CL 96 (L) 06/28/2022 0255   CO2 22 06/28/2022 0255   GLUCOSE 213 (H) 06/28/2022 0255   BUN 43 (H) 06/28/2022 0255   BUN 18 09/20/2019 1530   CREATININE 4.80 (H) 06/28/2022 0255   CALCIUM 8.6 (L) 06/28/2022 0255   GFRNONAA 10 (L) 06/28/2022 0255   GFRAA 49 (L) 11/30/2019 0356    INR    Component Value Date/Time   INR 1.1 10/08/2020 0017     Intake/Output Summary (Last 24 hours) at 06/29/2022 0752 Last data filed at 06/28/2022 2010 Gross per 24 hour  Intake 600 ml  Output 2750 ml  Net -2150 ml      Assessment/Plan:  57 y.o. female with HCAP   -No fever since yesterday morning. First set of blood cultures on 4/12 did not grow  anything. Repeat blood cultures taken yesterday are still pending -Palpable radial and brachial pulses bilaterally -Tentative plan for LUE AVF vs AVG tomorrow. Will make NPO past midnight and place consent orders   Loel Dubonnet, PA-C [facility] [phone] 06/29/2022 7:52 AM

## 2022-06-29 NOTE — Progress Notes (Signed)
Initial Nutrition Assessment  DOCUMENTATION CODES:   Morbid obesity  INTERVENTION:  - Modify to Heart healthy Carb Mod diet.   NUTRITION DIAGNOSIS:   Altered nutrition lab value related to chronic illness as evidenced by other (comment) (FS BG 131-298 mg/dL.).  GOAL:   Other (Comment) (tighter blood sugar control)  MONITOR:   Labs, PO intake  REASON FOR ASSESSMENT:   Consult Assessment of nutrition requirement/status  ASSESSMENT:   57 y.o. female admits related to SOB, weight gain, and left sided rib pain. PMH includes: AKI, cancer, CKD stage 3, CHF, DM, DKA, gastroparesis, gout, HLD, PAF, Vit B12. Pt is currently receiving medical management related to HCAP.  Meds reviewed: lipitor, ferrous sulfate, folic acid, sliding scale insulin, semglee 8 units, remeron. Labs reviewed: Na low, K low, chloride low, BUN/creatinine elevated. FS BG 131-298 mg/dL.   The pt is currently on a Regular diet. Pt has mostly been eating 50-100% of her meals since admission. No wt loss per record. RD will modify to heart healthy carb mod diet. Will continue to monitor PO intakes.   NUTRITION - FOCUSED PHYSICAL EXAM:  WDL - no wasting.  Diet Order:   Diet Order             Diet NPO time specified  Diet effective midnight           Diet regular Fluid consistency: Thin; Fluid restriction: Other (see comments)  Diet effective now                   EDUCATION NEEDS:   Not appropriate for education at this time  Skin:  Skin Assessment: Skin Integrity Issues: Skin Integrity Issues:: Incisions Incisions: left heel  Last BM:  4/23 - type 5  Height:   Ht Readings from Last 1 Encounters:  06/18/22  (1.676 m)    Weight:   Wt Readings from Last 1 Encounters:  06/28/22 (!) 144.1 kg    Ideal Body Weight:     BMI:  Body mass index is 51.28 kg/m.  Estimated Nutritional Needs:   Kcal:  0865-7846 kcals  Protein:  80-100 gm  Fluid:  >/= 1.5 L  Bethann Humble, RD, LDN,  CNSC.

## 2022-06-30 ENCOUNTER — Inpatient Hospital Stay (HOSPITAL_COMMUNITY): Payer: 59 | Admitting: Anesthesiology

## 2022-06-30 ENCOUNTER — Other Ambulatory Visit: Payer: Self-pay

## 2022-06-30 ENCOUNTER — Encounter (HOSPITAL_COMMUNITY): Payer: Self-pay | Admitting: Internal Medicine

## 2022-06-30 ENCOUNTER — Encounter (HOSPITAL_COMMUNITY)
Admission: EM | Disposition: A | Discharge status: Home / Self Care | Payer: Self-pay | Source: Home / Self Care | Attending: Internal Medicine

## 2022-06-30 DIAGNOSIS — N186 End stage renal disease: Secondary | ICD-10-CM

## 2022-06-30 DIAGNOSIS — I509 Heart failure, unspecified: Secondary | ICD-10-CM | POA: Diagnosis not present

## 2022-06-30 DIAGNOSIS — I132 Hypertensive heart and chronic kidney disease with heart failure and with stage 5 chronic kidney disease, or end stage renal disease: Secondary | ICD-10-CM | POA: Diagnosis not present

## 2022-06-30 DIAGNOSIS — J189 Pneumonia, unspecified organism: Secondary | ICD-10-CM | POA: Diagnosis not present

## 2022-06-30 DIAGNOSIS — N185 Chronic kidney disease, stage 5: Secondary | ICD-10-CM | POA: Diagnosis not present

## 2022-06-30 DIAGNOSIS — D631 Anemia in chronic kidney disease: Secondary | ICD-10-CM

## 2022-06-30 DIAGNOSIS — Z794 Long term (current) use of insulin: Secondary | ICD-10-CM

## 2022-06-30 DIAGNOSIS — E1122 Type 2 diabetes mellitus with diabetic chronic kidney disease: Secondary | ICD-10-CM

## 2022-06-30 DIAGNOSIS — Z992 Dependence on renal dialysis: Secondary | ICD-10-CM

## 2022-06-30 HISTORY — PX: AV FISTULA PLACEMENT: SHX1204

## 2022-06-30 LAB — RENAL FUNCTION PANEL
Albumin: 2 g/dL — ABNORMAL LOW (ref 3.5–5.0)
Anion gap: 12 (ref 5–15)
BUN: 33 mg/dL — ABNORMAL HIGH (ref 6–20)
CO2: 20 mmol/L — ABNORMAL LOW (ref 22–32)
Calcium: 8 mg/dL — ABNORMAL LOW (ref 8.9–10.3)
Chloride: 94 mmol/L — ABNORMAL LOW (ref 98–111)
Creatinine, Ser: 4.9 mg/dL — ABNORMAL HIGH (ref 0.44–1.00)
GFR, Estimated: 10 mL/min — ABNORMAL LOW (ref >60)
Glucose, Bld: 136 mg/dL — ABNORMAL HIGH (ref 70–99)
Phosphorus: 5.2 mg/dL — ABNORMAL HIGH (ref 2.5–4.6)
Potassium: 3.7 mmol/L (ref 3.5–5.1)
Sodium: 126 mmol/L — ABNORMAL LOW (ref 135–145)

## 2022-06-30 LAB — GLUCOSE, CAPILLARY
Glucose-Capillary: 144 mg/dL — ABNORMAL HIGH (ref 70–99)
Glucose-Capillary: 204 mg/dL — ABNORMAL HIGH (ref 70–99)
Glucose-Capillary: 241 mg/dL — ABNORMAL HIGH (ref 70–99)
Glucose-Capillary: 193 mg/dL — ABNORMAL HIGH (ref 70–99)
Glucose-Capillary: 328 mg/dL — ABNORMAL HIGH (ref 70–99)

## 2022-06-30 LAB — HEPARIN LEVEL (UNFRACTIONATED): Heparin Unfractionated: 0.42 IU/mL (ref 0.30–0.70)

## 2022-06-30 LAB — CULTURE, BLOOD (ROUTINE X 2): Special Requests: ADEQUATE

## 2022-06-30 LAB — CBC
HCT: 22.2 % — ABNORMAL LOW (ref 36.0–46.0)
Hemoglobin: 7.3 g/dL — ABNORMAL LOW (ref 12.0–15.0)
MCH: 28.3 pg (ref 26.0–34.0)
MCHC: 32.9 g/dL (ref 30.0–36.0)
MCV: 86 fL (ref 80.0–100.0)
Platelets: 296 10*3/uL (ref 150–400)
RBC: 2.58 MIL/uL — ABNORMAL LOW (ref 3.87–5.11)
RDW: 19.6 % — ABNORMAL HIGH (ref 11.5–15.5)
WBC: 16.4 10*3/uL — ABNORMAL HIGH (ref 4.0–10.5)
nRBC: 0 % (ref 0.0–0.2)

## 2022-06-30 LAB — APTT: aPTT: 55 seconds — ABNORMAL HIGH (ref 24–36)

## 2022-06-30 LAB — HEMOGLOBIN AND HEMATOCRIT, BLOOD
HCT: 21.9 % — ABNORMAL LOW (ref 36.0–46.0)
Hemoglobin: 7.2 g/dL — ABNORMAL LOW (ref 12.0–15.0)

## 2022-06-30 SURGERY — ARTERIOVENOUS (AV) FISTULA CREATION
Anesthesia: General | Site: Arm Upper | Laterality: Left

## 2022-06-30 MED ORDER — VANCOMYCIN HCL IN DEXTROSE 1-5 GM/200ML-% IV SOLN: 1000.0000 mg | Freq: Once | INTRAVENOUS | Status: DC

## 2022-06-30 MED ORDER — HEPARIN (PORCINE) 25000 UT/250ML-% IV SOLN
1600.0000 [IU]/h | INTRAVENOUS | Status: DC
  Administered 2022-06-30: 1600 [IU]/h via INTRAVENOUS
  Filled 2022-06-30: qty 250

## 2022-06-30 MED ORDER — ROCURONIUM BROMIDE 10 MG/ML (PF) SYRINGE
PREFILLED_SYRINGE | INTRAVENOUS | Status: DC | PRN
  Administered 2022-06-30: 60 mg via INTRAVENOUS
  Administered 2022-06-30: 40 mg via INTRAVENOUS

## 2022-06-30 MED ORDER — ORAL CARE MOUTH RINSE: 15.0000 mL | Freq: Once | OROMUCOSAL | Status: AC

## 2022-06-30 MED ORDER — SUGAMMADEX SODIUM 200 MG/2ML IV SOLN
INTRAVENOUS | Status: DC | PRN
  Administered 2022-06-30: 200 mg via INTRAVENOUS

## 2022-06-30 MED ORDER — ROCURONIUM BROMIDE 10 MG/ML (PF) SYRINGE
PREFILLED_SYRINGE | INTRAVENOUS | Status: AC
  Filled 2022-06-30: qty 10

## 2022-06-30 MED ORDER — LIDOCAINE 2% (20 MG/ML) 5 ML SYRINGE
INTRAMUSCULAR | Status: AC
  Filled 2022-06-30: qty 5

## 2022-06-30 MED ORDER — VANCOMYCIN HCL 1500 MG/300ML IV SOLN
1500.0000 mg | Freq: Once | INTRAVENOUS | Status: AC
  Administered 2022-06-30: 1500 mg via INTRAVENOUS
  Filled 2022-06-30: qty 300

## 2022-06-30 MED ORDER — FENTANYL CITRATE (PF) 100 MCG/2ML IJ SOLN: 25.0000 ug | INTRAMUSCULAR | Status: DC | PRN

## 2022-06-30 MED ORDER — PAPAVERINE HCL 30 MG/ML IJ SOLN
INTRAMUSCULAR | Status: AC
  Filled 2022-06-30: qty 2

## 2022-06-30 MED ORDER — HEPARIN 6000 UNIT IRRIGATION SOLUTION
Status: AC
  Filled 2022-06-30: qty 500

## 2022-06-30 MED ORDER — 0.9 % SODIUM CHLORIDE (POUR BTL) OPTIME
TOPICAL | Status: DC | PRN
  Administered 2022-06-30: 1000 mL

## 2022-06-30 MED ORDER — ONDANSETRON HCL 4 MG/2ML IJ SOLN: 4.0000 mg | Freq: Once | INTRAMUSCULAR | Status: DC | PRN

## 2022-06-30 MED ORDER — CHLORHEXIDINE GLUCONATE 0.12 % MT SOLN
OROMUCOSAL | Status: AC
  Administered 2022-06-30: 15 mL via OROMUCOSAL
  Filled 2022-06-30: qty 15

## 2022-06-30 MED ORDER — PROPOFOL 10 MG/ML IV BOLUS
INTRAVENOUS | Status: DC | PRN
  Administered 2022-06-30: 120 mg via INTRAVENOUS

## 2022-06-30 MED ORDER — PHENYLEPHRINE HCL-NACL 20-0.9 MG/250ML-% IV SOLN
INTRAVENOUS | Status: DC | PRN
  Administered 2022-06-30: 30 ug/min via INTRAVENOUS

## 2022-06-30 MED ORDER — HYDROMORPHONE HCL 1 MG/ML IJ SOLN
INTRAMUSCULAR | Status: AC
  Filled 2022-06-30: qty 0.5

## 2022-06-30 MED ORDER — DEXAMETHASONE SODIUM PHOSPHATE 10 MG/ML IJ SOLN
INTRAMUSCULAR | Status: DC | PRN
  Administered 2022-06-30: 5 mg via INTRAVENOUS

## 2022-06-30 MED ORDER — HEPARIN SODIUM (PORCINE) 1000 UNIT/ML IJ SOLN
INTRAMUSCULAR | Status: DC | PRN
  Administered 2022-06-30: 5000 [IU] via INTRAVENOUS

## 2022-06-30 MED ORDER — PHENYLEPHRINE 80 MCG/ML (10ML) SYRINGE FOR IV PUSH (FOR BLOOD PRESSURE SUPPORT)
PREFILLED_SYRINGE | INTRAVENOUS | Status: AC
  Filled 2022-06-30: qty 10

## 2022-06-30 MED ORDER — FENTANYL CITRATE (PF) 250 MCG/5ML IJ SOLN
INTRAMUSCULAR | Status: AC
  Filled 2022-06-30: qty 5

## 2022-06-30 MED ORDER — LIDOCAINE 2% (20 MG/ML) 5 ML SYRINGE
INTRAMUSCULAR | Status: DC | PRN
  Administered 2022-06-30: 100 mg via INTRAVENOUS

## 2022-06-30 MED ORDER — ONDANSETRON HCL 4 MG/2ML IJ SOLN
INTRAMUSCULAR | Status: AC
  Filled 2022-06-30: qty 2

## 2022-06-30 MED ORDER — HEPARIN 6000 UNIT IRRIGATION SOLUTION
Status: DC | PRN
  Administered 2022-06-30: 1

## 2022-06-30 MED ORDER — CHLORHEXIDINE GLUCONATE 0.12 % MT SOLN: 15.0000 mL | Freq: Once | OROMUCOSAL | Status: AC

## 2022-06-30 MED ORDER — DEXAMETHASONE SODIUM PHOSPHATE 10 MG/ML IJ SOLN
INTRAMUSCULAR | Status: AC
  Filled 2022-06-30: qty 1

## 2022-06-30 MED ORDER — ONDANSETRON HCL 4 MG/2ML IJ SOLN
INTRAMUSCULAR | Status: DC | PRN
  Administered 2022-06-30: 4 mg via INTRAVENOUS

## 2022-06-30 MED ORDER — VANCOMYCIN HCL IN DEXTROSE 1-5 GM/200ML-% IV SOLN
INTRAVENOUS | Status: AC
  Filled 2022-06-30: qty 200

## 2022-06-30 MED ORDER — SODIUM CHLORIDE 0.9 % IV SOLN: INTRAVENOUS | Status: DC

## 2022-06-30 MED ORDER — PHENYLEPHRINE 80 MCG/ML (10ML) SYRINGE FOR IV PUSH (FOR BLOOD PRESSURE SUPPORT)
PREFILLED_SYRINGE | INTRAVENOUS | Status: DC | PRN
  Administered 2022-06-30: 160 ug via INTRAVENOUS

## 2022-06-30 MED ORDER — MIDAZOLAM HCL 2 MG/2ML IJ SOLN
INTRAMUSCULAR | Status: AC
  Filled 2022-06-30: qty 2

## 2022-06-30 MED ORDER — HYDROMORPHONE HCL 1 MG/ML IJ SOLN
INTRAMUSCULAR | Status: DC | PRN
  Administered 2022-06-30: .5 mg via INTRAVENOUS

## 2022-06-30 MED ORDER — FENTANYL CITRATE (PF) 250 MCG/5ML IJ SOLN
INTRAMUSCULAR | Status: DC | PRN
  Administered 2022-06-30: 100 ug via INTRAVENOUS
  Administered 2022-06-30 (×2): 50 ug via INTRAVENOUS

## 2022-06-30 MED ORDER — PROPOFOL 10 MG/ML IV BOLUS
INTRAVENOUS | Status: AC
  Filled 2022-06-30: qty 20

## 2022-06-30 SURGICAL SUPPLY — 40 items
ADH SKN CLS APL DERMABOND .7 (GAUZE/BANDAGES/DRESSINGS) ×1
AGENT HMST SPONGE THK3/8 (HEMOSTASIS)
ARMBAND PINK RESTRICT EXTREMIT (MISCELLANEOUS) ×2 IMPLANT
BAG COUNTER SPONGE SURGICOUNT (BAG) ×1 IMPLANT
BAG SPNG CNTER NS LX DISP (BAG) ×1
BLADE CLIPPER SURG (BLADE) ×1 IMPLANT
BNDG CMPR 5X4 KNIT ELC UNQ LF (GAUZE/BANDAGES/DRESSINGS) ×1
BNDG ELASTIC 4INX 5YD STR LF (GAUZE/BANDAGES/DRESSINGS) IMPLANT
CANISTER SUCT 3000ML PPV (MISCELLANEOUS) ×1 IMPLANT
CLIP TI MEDIUM 6 (CLIP) ×1 IMPLANT
CLIP TI WIDE RED SMALL 6 (CLIP) ×1 IMPLANT
COVER PROBE W GEL 5X96 (DRAPES) ×1 IMPLANT
DERMABOND ADVANCED .7 DNX12 (GAUZE/BANDAGES/DRESSINGS) ×1 IMPLANT
ELECT REM PT RETURN 9FT ADLT (ELECTROSURGICAL) ×1
ELECTRODE REM PT RTRN 9FT ADLT (ELECTROSURGICAL) ×1 IMPLANT
GLOVE BIO SURGEON STRL SZ7.5 (GLOVE) ×1 IMPLANT
GLOVE BIOGEL PI IND STRL 8 (GLOVE) ×1 IMPLANT
GOWN STRL REUS W/ TWL LRG LVL3 (GOWN DISPOSABLE) ×2 IMPLANT
GOWN STRL REUS W/ TWL XL LVL3 (GOWN DISPOSABLE) ×2 IMPLANT
GOWN STRL REUS W/TWL LRG LVL3 (GOWN DISPOSABLE) ×2
GOWN STRL REUS W/TWL XL LVL3 (GOWN DISPOSABLE) ×2
HEMOSTAT SPONGE AVITENE ULTRA (HEMOSTASIS) IMPLANT
KIT BASIN OR (CUSTOM PROCEDURE TRAY) ×1 IMPLANT
KIT TURNOVER KIT B (KITS) ×1 IMPLANT
LOOP VASCULAR MINI 18 RED (MISCELLANEOUS) ×1
NS IRRIG 1000ML POUR BTL (IV SOLUTION) ×1 IMPLANT
PACK CV ACCESS (CUSTOM PROCEDURE TRAY) ×1 IMPLANT
PAD ARMBOARD 7.5X6 YLW CONV (MISCELLANEOUS) ×2 IMPLANT
SLING ARM FOAM STRAP LRG (SOFTGOODS) IMPLANT
SLING ARM FOAM STRAP MED (SOFTGOODS) IMPLANT
SPIKE FLUID TRANSFER (MISCELLANEOUS) ×1 IMPLANT
SUT MNCRL AB 4-0 PS2 18 (SUTURE) ×1 IMPLANT
SUT PROLENE 6 0 BV (SUTURE) ×1 IMPLANT
SUT PROLENE 7 0 BV 1 (SUTURE) IMPLANT
SUT VIC AB 3-0 SH 27 (SUTURE) ×1
SUT VIC AB 3-0 SH 27X BRD (SUTURE) ×1 IMPLANT
TOWEL GREEN STERILE (TOWEL DISPOSABLE) ×1 IMPLANT
UNDERPAD 30X36 HEAVY ABSORB (UNDERPADS AND DIAPERS) ×1 IMPLANT
VASCULAR TIE MINI RED 18IN STL (MISCELLANEOUS) IMPLANT
WATER STERILE IRR 1000ML POUR (IV SOLUTION) ×1 IMPLANT

## 2022-06-30 NOTE — Progress Notes (Signed)
[facility] [place]  Subjective  -no complaints.   Objective 136/68 100 98.8 F (37.1 C) (Oral) 20 95%  Intake/Output Summary (Last 24 hours) at 06/30/2022 0955 Last data filed at 06/30/2022 0935 Gross per 24 hour  Intake 240 ml  Output 500 ml  Net -260 ml    Left radial pulse palpable  Laboratory Lab Results: Recent Labs    06/29/22 0401 06/30/22 0501  WBC 14.8* 16.4*  HGB 7.8* 7.3*  HCT 25.3* 22.2*  PLT 313 296   BMET Recent Labs    06/28/22 0255 06/30/22 0501  NA 131* 126*  K 3.3* 3.7  CL 96* 94*  CO2 22 20*  GLUCOSE 213* 136*  BUN 43* 33*  CREATININE 4.80* 4.90*  CALCIUM 8.6* 8.0*    COAG Lab Results  Component Value Date   INR 1.1 10/08/2020   INR 1.1 01/24/2020   INR 1.2 05/08/2019   No results found for: "PTT"  Assessment/Planning:  57 year old female that vascular surgery was consulted for permanent hemodialysis access.  Plan left arm AV fistula.  She does have a usable cephalic vein on vein mapping.  She is being treated for pneumonia.  I have discussed her fevers with Dr. Malen Gauze and given she has usable autogenous vein we will plan to proceed.  No growth on blood cultures.  She is not ill-appearing.  Discussed access takes up to 3 months to mature.  She may need a second operation to superficialize fistula in the future.  Risk benefits discussed including failure to mature and steal.  Cephus Shelling 06/30/2022 9:55 AM --

## 2022-06-30 NOTE — Progress Notes (Signed)
Pt has been accepted at [facility] on MWF 12:10 chair time. Pt can start on Friday and will need to arrive at 11:15 to complete paperwork prior to treatment. Attempted to meet with pt to discuss details but pt being set-up for HD. Will f/u with pt as schedule allows. Arrangements added to AVS. Update provided to pt's attending, nephrologist, RN, and RN CM. Will assist as needed.   Olivia Canter Renal Navigator [phone]

## 2022-06-30 NOTE — Progress Notes (Signed)
Admit: 06/18/2022 LOS: 11  46F AoCKD4, multifocal pneumonia likley 2/2 metapneumovirus, recent admission for disseminated gout with encephalopathy and AKI, just discharged from SNF.   Subjective:   Seen and examined on dialysis.  Procedure supervised.  Blood pressure 112/61 and HR 88.  Tolerating goal.  RIJ tunn catheter placed.  She had a left brachiocephalic AVF placed earlier today with vascular.    Review of systems:   Denies n/v Denies shortness of breath Denies chest pain  Her Legs hurt    Physical Exam:  Blood pressure 123/65, pulse 91, temperature 98.6 F (37 C), resp. rate (!) 21, height  (1.676 m), weight (!) 144.1 kg, last menstrual period 10/10/2012, SpO2 100 %. General adult female in bed in no acute distress but uncomfortable   HEENT normocephalic atraumatic extraocular movements intact sclera anicteric Neck supple trachea midline Lungs clear to auscultation bilaterally normal work of breathing at rest on 2 liters Heart S1S2 no rub Abdomen soft nontender nondistended Extremities diffuse 2+ edema lower extremities  Psych normal mood and affect Neuro - sleepy after surgery.  She answers questions and follows commands  GU - foley catheter with urine  Access RIJ tunn catheter; LUE AVF with bruit    Home meds - zyloprim, albuterol, norvasc 10, eliquis, lipitor, coreg 12.5 bid, colchicine, aricept, pepcid, hydralazine 50 tid, insulin aspart/ glargine/ lispro, remeron, mVI, sod bicarb, torsemide 20 qd, uloric, protonix, prns    Date   Creat  eGFR  2018-2020  1.01- 1.39   2021   1.31- 2.70  2022   1.64- 4.38  2023   1.72- 6.25 Mar 2022  3.02- 5.93 8- 17 ml/min  March 2024  1.92- 4.59 10-30 ml/min  4/12   4.84  10 ml/min    4/13   4.82  4/14   4.83  06/20/21  4.99  9 ml/min     B/l creat 2.1- 3.1 from march 2023, eGFR 17- 28 ml/ min   UA 11-20 wbc, 5-10 rbc from 06/19/22, prot 30   CT abd renal --> normal appearing kidneys w/o obstruction    Assessment/  Plan AoCKD4 with progression to ESRD Baseline creat 2.4 - 3 from march 2024.  Creat here 4.8 on admission in setting of MPV resp infection and lower ext edema. Followed by Dr Thedore Mins at [facility]. UA unremarkable, CT abd w/o obstruction.  Per charting, Lasix started at 40 mg bid IV then titrated up to  IV tid but no sig improvement in wt's or edema since admission. Pt's brother (esrd on HD) called and discussed with team that her QOL is "horrible" and has been for the last couple of years due to her chronic fluid overload issues and he doesn't see her getting better. He felt she would do better on dialysis. Nephrology d/w the patient and she acknowledged very difficult times lately due to chronic fluid overload refractory to diuretics.  TDC placed by IR and pt had her 1st HD Friday 4/19.  She had a left brachiocephalic AVF placed on 4/24 with Dr. Chestine Spore   HD today and per MWF schedule She has been accepted to [facility] on MWF schedule    Metapneumovirus - admit diagnosis, w/ multifocal pneumonia R > L, sp IV vanc + cefepime, BCx NGTD. s/p po doxy.  Fluid volume overload - improving w/ dialysis Hypertension - controlled; optimize volume with HD.  She is on coreg and blood pressure is not low but she feels poorly after HD.  If BP  drops may need to lower the coreg.   Gout - with severe hyperuricemia; per primary team  Anemia of CKD -   transfuse per TRH.  Started aranesp 40 mcg every Monday    HFpEF - optimize volume with HD  DM2 with history of HHS.  Per primary team  Atrial fibrillation - per primary team.  Note that her only BP agent is also directed at her afib   Disposition - disposition per primary team    Recent Labs  Lab 06/25/22 0759 06/26/22 0316 06/28/22 0255 06/29/22 0401 06/30/22 0501  HGB 7.4*   < > 7.7* 7.8* 7.3*  ALBUMIN 2.0*  --   --   --  2.0*  CALCIUM 8.3*   < > 8.6*  --  8.0*  PHOS 8.1*  --   --   --  5.2*  CREATININE 5.23*   < > 4.80*  --  4.90*  K 3.3*   < > 3.3*  --   3.7   < > = values in this interval not displayed.    Inpatient medications:  allopurinol  50 mg Oral Once per day on Sun Wed   atorvastatin  10 mg Oral Daily   carvedilol  12.5 mg Oral BID WC   Chlorhexidine Gluconate Cloth  6 each Topical Daily   Chlorhexidine Gluconate Cloth  6 each Topical Q0600   Chlorhexidine Gluconate Cloth  6 each Topical Q0600   darbepoetin (ARANESP) injection - NON-DIALYSIS  40 mcg Subcutaneous Q Mon-1800   donepezil  5 mg Oral QHS   febuxostat  40 mg Oral Daily   ferrous sulfate  325 mg Oral BID WC   folic acid  1 mg Oral Daily   guaiFENesin  600 mg Oral BID   insulin aspart  0-5 Units Subcutaneous QHS   insulin aspart  0-6 Units Subcutaneous TID WC   insulin aspart  4 Units Subcutaneous TID WC   insulin glargine-yfgn  8 Units Subcutaneous QHS   mirtazapine  30 mg Oral QHS   pantoprazole  40 mg Oral BID    heparin     vancomycin      albuterol, guaiFENesin, HYDROmorphone, loperamide, ondansetron **OR** ondansetron (ZOFRAN) IV, mouth rinse, vancomycin    Estanislado Emms, MD 3:36 PM 06/30/2022

## 2022-06-30 NOTE — Progress Notes (Signed)
ANTICOAGULATION CONSULT NOTE - follow-up  Pharmacy Consult for heparin Indication: atrial fibrillation  Allergies  Allergen Reactions   Diazepam [name]   Gabapentin [name] and Swelling    Other reaction(s): Unknown   Iodinated Contrast Media Anaphylaxis    11/29/17 Cardiac arrest 1 min after IV contrast, possible allergy vs vasovagal episode Iopamidol  Anaphylaxis  High 11/28/2017  Patient had seizure like activity and then code post 100 cc of isovue 300     Isovue [Iopamidol] Anaphylaxis    11/28/17 Patient had seizure like activity and then 1 min code after 100 cc of isovue 300. Possible contrast allergy vs vasovagal episode   Lisinopril Anaphylaxis    Tongue and mouth swelling   Metoclopramide Other (See Comments)    Tardive dyskinesia  Also known as Reglan    Nsaids Anaphylaxis and Other (See Comments)    ULCER   Penicillins Palpitations    Has patient had a PCN reaction causing immediate rash, facial/tongue/throat swelling, SOB or lightheadedness with hypotension: Yes, heart races Has patient had a PCN reaction causing severe rash involving mucus membranes or skin necrosis: No Has patient had a PCN reaction that required hospitalization: Yes  Has patient had a PCN reaction occurring within the last 10 years: No    Tolmetin Nausea Only and Other (See Comments)    ULCER   Dicyclomine Other (See Comments)    Chest pain   Acetaminophen Nausea Only and Other (See Comments)    Irritates stomach ulcer; Abdominal pain   Cyclobenzaprine Palpitations   Oxycodone Palpitations   Rifamycins Palpitations   Tramadol Nausea And Vomiting    Patient Measurements: Height:  (167.6 cm) Weight: (!) 144.1 kg (317 lb 10.9 oz) (BED) IBW/kg (Calculated) : 59.3 Heparin Dosing Weight: 94.8 kg    Vital Signs: Temp: 99.1 F (37.3 C) (04/24 0534) Temp Source: Oral (04/24 0534) BP: 122/56 (04/24 0534) Pulse Rate: 107 (04/24 0534)  Labs: Recent Labs     06/28/22 0255 06/28/22 2010 06/29/22 0401 06/30/22 0501 06/30/22 0717  HGB 7.7*  --  7.8* 7.3*  --   HCT 23.9*  --  25.3* 22.2*  --   PLT 236  --  313 296  --   APTT  --  74* 58*  --  55*  HEPARINUNFRC  --  0.99* 0.89*  --  0.42  CREATININE 4.80*  --   --  4.90*  --      Estimated Creatinine Clearance: 18.4 mL/min (A) (by C-G formula based on SCr of 4.9 mg/dL (H)).   Assessment: Patient is a 51 yof admitted for HCAP. Patient has a history of a fib and is on Eliquis 5 mg BID PTA and this has been continued while inpatient, last dose 4/21 at 840. Patient is to have an AV fistula VS graft on Wednesday 4/24 thus pharmacist has been consulted to transition from Eliquis to heparin in the perioperative setting   Heparin level and PTT now correlating - Heparin level therapeutic   Goal of Therapy:  Heparin level 0.3-0.7 units/ml aPTT 66-102 seconds Monitor platelets by anticoagulation protocol: Yes   Plan:  Continue heparin at 1600 units / hr Follow up post AVG revision - back to Eliquis   Thank you Okey Regal, PharmD 06/30/2022 8:49 AM  **Pharmacist phone directory can be found on [email or web address] listed under [facility]**

## 2022-06-30 NOTE — Discharge Instructions (Signed)
   [facility]  Discharge Instructions  AV Fistula or Graft Surgery for Dialysis Access  Please refer to the following instructions for your post-procedure care. Your surgeon or physician assistant will discuss any changes with you.  Activity  You may drive the day following your surgery, if you are comfortable and no longer taking prescription pain medication. Resume full activity as the soreness in your incision resolves.  Bathing/Showering  You may shower after you go home. Keep your incision dry for 48 hours. Do not soak in a bathtub, hot tub, or swim until the incision heals completely. You may not shower if you have a hemodialysis catheter.  Incision Care  Clean your incision with mild soap and water after 48 hours. Pat the area dry with a clean towel. You do not need a bandage unless otherwise instructed. Do not apply any ointments or creams to your incision. You may have skin glue on your incision. Do not peel it off. It will come off on its own in about one week. Your arm may swell a bit after surgery. To reduce swelling use pillows to elevate your arm so it is above your heart. Your doctor will tell you if you need to lightly wrap your arm with an ACE bandage.  Diet  Resume your normal diet. There are not special food restrictions following this procedure. In order to heal from your surgery, it is CRITICAL to get adequate nutrition. Your body requires vitamins, minerals, and protein. Vegetables are the best source of vitamins and minerals. Vegetables also provide the perfect balance of protein. Processed food has little nutritional value, so try to avoid this.  Medications  Resume taking all of your medications. If your incision is causing pain, you may take over-the counter pain relievers such as acetaminophen (Tylenol). If you were prescribed a stronger pain medication, please be aware these medications can cause nausea and constipation. Prevent  nausea by taking the medication with a snack or meal. Avoid constipation by drinking plenty of fluids and eating foods with high amount of fiber, such as fruits, vegetables, and grains.  Do not take Tylenol if you are taking prescription pain medications.  Follow up Your surgeon may want to see you in the office following your access surgery. If so, this will be arranged at the time of your surgery.  Please call us immediately for any of the following conditions:  Increased pain, redness, drainage (pus) from your incision site Fever of 101 degrees or higher Severe or worsening pain at your incision site Hand pain or numbness.  Reduce your risk of vascular disease:  Stop smoking. If you would like help, call [facility] at 1-800-QUIT-NOW (2728756413) or [place] at 917-538-9585  Manage your cholesterol Maintain a desired weight Control your diabetes Keep your blood pressure down  Dialysis  It will take several weeks to several months for your new dialysis access to be ready for use. Your surgeon will determine when it is okay to use it. Your nephrologist will continue to direct your dialysis. You can continue to use your Permcath until your new access is ready for use.   06/30/2022 Deborah [name] Deborah [name] Deborah [name]  Surgeon(s): Cephus Shelling, MD  Procedure(s): LEFT BRACHIOCEPHALIC ARTERIOVENOUS (AV) FISTULA CREATION  x Do not stick fistula for 12 weeks    If you have any questions, please call the office at [phone].

## 2022-06-30 NOTE — Anesthesia Preprocedure Evaluation (Addendum)
Anesthesia Evaluation  Patient identified by MRN, date of birth, ID band  Reviewed: Allergy & Precautions, NPO status , Patient's Chart, lab work & pertinent test results, reviewed documented beta blocker date and time   Airway Mallampati: III  TM Distance: >3 FB Neck ROM: Full    Dental  (+) Teeth Intact, Dental Advisory Given   Pulmonary pneumonia   Pulmonary exam normal breath sounds clear to auscultation       Cardiovascular hypertension, Pt. on home beta blockers +CHF  Normal cardiovascular exam+ dysrhythmias Atrial Fibrillation  Rhythm:Regular Rate:Normal  Echo 09/11/21: 1. Left ventricular ejection fraction, by estimation, is 50 to 55%. The  left ventricle has low normal function. The left ventricle has no regional  wall motion abnormalities. There is moderate left ventricular hypertrophy.  Left ventricular diastolic  parameters are indeterminate.   2. Right ventricle was not well visualized but grossly normal size and  systolic function. There is normal pulmonary artery systolic pressure. The  estimated right ventricular systolic pressure is 31.9 mmHg.   3. The mitral valve is normal in structure. Trivial mitral valve  regurgitation.   4. The aortic valve is tricuspid. Aortic valve regurgitation is not  visualized. Aortic valve sclerosis is present, with no evidence of aortic  valve stenosis.   5. The inferior vena cava is normal in size with greater than 50%  respiratory variability, suggesting right atrial pressure of 3 mmHg.     Neuro/Psych  PSYCHIATRIC DISORDERS Anxiety Depression    CVA    GI/Hepatic PUD,GERD  ,,  Endo/Other  diabetes, Type 2, Insulin Dependent    Renal/GU ESRF and DialysisRenal disease     Musculoskeletal  (+)  Fibromyalgia -  Abdominal   Peds  Hematology  (+) Blood dyscrasia, anemia   Anesthesia Other Findings   Reproductive/Obstetrics                              Anesthesia Physical Anesthesia Plan  ASA: 4  Anesthesia Plan: General   Post-op Pain Management:    Induction: Intravenous  PONV Risk Score and Plan: 3 and Dexamethasone and Ondansetron  Airway Management Planned: Oral ETT  Additional Equipment:   Intra-op Plan:   Post-operative Plan: Extubation in OR  Informed Consent: I have reviewed the patients History and Physical, chart, labs and discussed the procedure including the risks, benefits and alternatives for the proposed anesthesia with the patient or authorized representative who has indicated his/her understanding and acceptance.     Dental advisory given  Plan Discussed with: CRNA  Anesthesia Plan Comments:         Anesthesia Quick Evaluation

## 2022-06-30 NOTE — Progress Notes (Signed)
ANTICOAGULATION CONSULT NOTE - follow-up  Pharmacy Consult for heparin Indication: atrial fibrillation  Allergies  Allergen Reactions   Diazepam [name]   Gabapentin [name] and Swelling    Other reaction(s): Unknown   Iodinated Contrast Media Anaphylaxis    11/29/17 Cardiac arrest 1 min after IV contrast, possible allergy vs vasovagal episode Iopamidol  Anaphylaxis  High 11/28/2017  Patient had seizure like activity and then code post 100 cc of isovue 300     Isovue [Iopamidol] Anaphylaxis    11/28/17 Patient had seizure like activity and then 1 min code after 100 cc of isovue 300. Possible contrast allergy vs vasovagal episode   Lisinopril Anaphylaxis    Tongue and mouth swelling   Metoclopramide Other (See Comments)    Tardive dyskinesia  Also known as Reglan    Nsaids Anaphylaxis and Other (See Comments)    ULCER   Penicillins Palpitations    Has patient had a PCN reaction causing immediate rash, facial/tongue/throat swelling, SOB or lightheadedness with hypotension: Yes, heart races Has patient had a PCN reaction causing severe rash involving mucus membranes or skin necrosis: No Has patient had a PCN reaction that required hospitalization: Yes  Has patient had a PCN reaction occurring within the last 10 years: No    Tolmetin Nausea Only and Other (See Comments)    ULCER   Dicyclomine Other (See Comments)    Chest pain   Acetaminophen Nausea Only and Other (See Comments)    Irritates stomach ulcer; Abdominal pain   Cyclobenzaprine Palpitations   Oxycodone Palpitations   Rifamycins Palpitations   Tramadol Nausea And Vomiting    Patient Measurements: Height:  (167.6 cm) Weight: (!) 144.1 kg (317 lb 10.9 oz) (BED) IBW/kg (Calculated) : 59.3 Heparin Dosing Weight: 94.8 kg    Vital Signs: Temp: 99.9 F (37.7 C) (04/24 1346) Temp Source: Oral (04/24 1346) BP: 116/59 (04/24 1346) Pulse Rate: 92 (04/24 1346)  Labs: Recent Labs     06/28/22 0255 06/28/22 2010 06/29/22 0401 06/30/22 0501 06/30/22 0717  HGB 7.7*  --  7.8* 7.3*  --   HCT 23.9*  --  25.3* 22.2*  --   PLT 236  --  313 296  --   APTT  --  74* 58*  --  55*  HEPARINUNFRC  --  0.99* 0.89*  --  0.42  CREATININE 4.80*  --   --  4.90*  --      Estimated Creatinine Clearance: 18.4 mL/min (A) (by C-G formula based on SCr of 4.9 mg/dL (H)).   Assessment: Patient is a 72 yof admitted for HCAP. Patient has a history of a fib and is on Eliquis 5 mg BID PTA and this has been continued while inpatient, last dose 4/21 at 840. Patient is to have an AV fistula VS graft on Wednesday 4/24 thus pharmacist has been consulted to transition from Eliquis to heparin in the perioperative setting   Heparin level and PTT now correlating - Heparin level therapeutic this AM  S/p AVG  - to resume heparin at 2200 pm  Goal of Therapy:  Heparin level 0.3-0.7 units/ml aPTT 66-102 seconds Monitor platelets by anticoagulation protocol: Yes   Plan:  Heparin at 1600 units / hr at 2200 pm Follow up AM heparin level, CBC Transition back to Eliquis   Thank you Okey Regal, PharmD 06/30/2022 2:15 PM  **Pharmacist phone directory can be found on [email or web address] listed under [facility]**

## 2022-06-30 NOTE — Progress Notes (Signed)
Pt returned from hemodialysis at this time.  O2 at 2 liters [place].  Pt c/o pain.  Meds provided.  NSR 91.

## 2022-06-30 NOTE — Anesthesia Procedure Notes (Cosign Needed)
Procedure Name: Intubation Date/Time: 06/30/2022 10:48 AM  Performed by: Collene Schlichter, MDPre-anesthesia Checklist: Patient identified, Emergency Drugs available, Suction available and Patient being monitored Patient Re-evaluated:Patient Re-evaluated prior to induction Oxygen Delivery Method: Circle system utilized Preoxygenation: Pre-oxygenation with 100% oxygen Induction Type: IV induction Ventilation: Mask ventilation without difficulty and Oral airway inserted - appropriate to patient size Laryngoscope Size: Miller and 3 Grade View: Grade I Tube type: Oral Tube size: 7.0 mm Number of attempts: 1 Airway Equipment and Method: Stylet and Oral airway Placement Confirmation: ETT inserted through vocal cords under direct vision, positive ETCO2 and breath sounds checked- equal and bilateral Secured at: 22 cm Tube secured with: Tape Dental Injury: Teeth and Oropharynx as per pre-operative assessment

## 2022-06-30 NOTE — Progress Notes (Signed)
Pt received post surgery back to 5 M renal room 14.  MD texted about diet, hgb.  Left arm with coban but definitely able to feel thrill.

## 2022-06-30 NOTE — Progress Notes (Signed)
Pt transferred via bed to HD at this time.

## 2022-06-30 NOTE — Progress Notes (Signed)
PROGRESS NOTE Deborah [name]  NWG:[id number] DOB: 1965/06/15 DOA: 06/18/2022 PCP: Elsie Amis, MD  Brief Narrative/Hospital Course: 57 y.o.f w/ medical history significant for insulin-dependent diabetes mellitus, hypertension, PAF on Eliquis, CKD stage IV, iron deficiency anemia, chronic pain, duodenal carcinoid tumor resected in 2021 and recent admission 05/28/22 -06/04/22 with diagnosis of acute metabolic encephalopathy due to dehydration with associated AKI on CKDIV, with course complicated by disseminated gout and discharged on colchine, Uloric  and allopurinol as well as steroid taper to SNF presented with worsening shortness of breath, fluid overload past 24 hours In the VH:QIONGE has revealed multiple opacities on chest x-ray, COVID-19 is negative and respiratory viral panel is positive for metapneumovirus infection.  Placed on empiric antibiotics, seen by nephrology for fluid overload and placed on high-dose Lasix without improvement. Subsequently transferred to [facility] 4/18 for dialysis and access Underwent right IJ tunneled HD catheter placement by IR 4/18 Seen by Dr. Lenell Antu from vascular for HD access advised supportive left upper extremity from further venipuncture and awaiting vein mapping  Subjective: Seen this am Labs shows sodium dropped to 126 hemoglobin 7.3, WBC up from yesterday AFEBRILE SINCE 4/21.  Blood culture no growth to date from 4/22 AM Co chronic abdomen pain and also feels hungry, requesting for Dilaudid Wt :343 lb  on 4/17> 317 lb 4/21> 322> 317 lb    Assessment and Plan: Principal Problem:   HCAP (healthcare-associated pneumonia)  Metapneumovirus multifocal pneumonia right more than left Fever and leukocytosis: Initially received vancomycin and cefepime,blood culture no growth to date transitioned to doxycycline and completed. Continue to provide respiratory support withOOB, IS  bronchodilators as needed.  Fever 4/21, blood culture from 422 no  growth so far, afebrile since.  Has fluctuating WBC count.   Recent Labs  Lab 06/26/22 0316 06/27/22 0225 06/28/22 0255 06/29/22 0401 06/30/22 0501  WBC 13.2* 13.3* 14.0* 14.8* 16.4*    Acute on chronic kidney disease stage IV Bilateral lower extremity edema: B/L creat 2.1-3.1 from March 2023>4.8 creatinine on admission.  AKI in the setting of metapneumovirus respiratory infection and with fluid overload.UA unremarkable CT abdomen without obstruction, placed on high-dose Lasix without improvement in leg edema despite good urine output >subsequently s/p tunneled HD placement and dialysis initiated 4/19.  >Continue HD per nephrology w/ [facility] >VVS following for access AVF-Eliquis has been held and placed on heparin infusion>hold pre-op per VVS.  >Avoid nephrotoxic medications including NSAIDs and iodinated intravenous contrast exposure unless the latter is absolutely indicated.Preferred narcotic agents for pain control XBM:[id number], fentanyl, and methadone and avoid morphine.Avoid Baclofen and avoid oral sodium phosphate and magnesium citrate based laxatives / bowel preps. Continue strict Input and Output monitoring and serial renal functions.  Requesting regular diet. Net IO Since Admission: -9,706.04 mL [06/30/22 0907]  Filed Weights   06/26/22 2002 06/28/22 0811 06/28/22 1203  Weight: (!) 143.8 kg (!) 146.4 kg (!) 144.1 kg   Hypokalemia:resolved, changed On Lower Side Metabolic acidosis in the setting of CKD and AKI monitor.  Hcos at 20  Hypertension Chronic CHF with preserved EF: BP well controlled. Fluid management  w/ HD by nephro and continue coreg  Atrial fibrillation rate controlled on Coreg, changed Eliquis> heparin drip 4/21 preop> transition back to Eliquis post op once okay with vascular  Anemia multifactorial from IDA and CKD: Hemoglobin stable but low> Pt had 2 u prbc done on 4/13.She will likely need additional 1 unit PRBC transfusion-recheck H&H later today. Cont iron  supplement, folate and monitor. Recent  Labs  Lab 06/26/22 0316 06/27/22 0225 06/28/22 0255 06/29/22 0401 06/30/22 0501  HGB 7.8* 7.5* 7.7* 7.8* 7.3*  HCT 23.1* 23.9* 23.9* 25.3* 22.2*    Hyperlipidemia continue statin  Chronic pain on abdomen Left-sided abdominal pain Lumbar stenosis: Underwent CT renal study without obvious acute finding, has diverticulosis.  LFTs are stable.  Lipase mildly elevated but in the setting of CKD AKI.  Cont pain management and stool softener.  She takes Dilaudid mg every 8 hours as needed at Home , has been doubling the dose if she has worsening pain at home on PRN basis.She requests IV dilaudid soemtime here. We discussed about minimizing opiates for chronic pain   Hyperuricemia/gout with recent flare of and disseminated into hyperuricemia and encephalopathy: Uric acid now down to 8, was 16.5 at previous admission.  Continue colchicine, allopurinol. completed prednisone taper previously. Continue on colchicine and uloric.  Type 2 diabetes mellitus history of HHS: HbA1c poorly controlled 8.1 blood sugar borderline controlled now on Semglee 14 u qhs> decrease to 8 units as she will be n.p.o. past midnight, cont Premeal and SSI , monitor and adjust Recent Labs  Lab 06/28/22 2005 06/29/22 0726 06/29/22 1124 06/29/22 1622 06/29/22 2024  GLUCAP 298* 164* 180* 247* 156*    Morbid Obesity:Patient's Body mass index is 51.28 kg/m. : Will benefit with PCP follow-up, weight loss  healthy lifestyle and outpatient sleep evaluation.  DVT prophylaxis: Eliquis Code Status:   Code Status: Full Code Family Communication: plan of care discussed with patient at bedside. Patient status is: Inpatient because of need for dialysis Level of care: Telemetry Medical   Dispo: The patient is from: SNF            Anticipated disposition: SNF TBD Objective: Vitals last 24 hrs: Vitals:   06/29/22 0442 06/29/22 0957 06/29/22 1623 06/30/22 0534  BP: 117/62 (!) 110/59 129/65  (!) 122/56  Pulse: 96 96 (!) 102 (!) 107  Resp: Temp: 97.8 F (36.6 C) 98.4 F (36.9 C) 98.4 F (36.9 C) 99.1 F (37.3 C)  TempSrc: Oral   Oral  SpO2: 97% 100% 100% 97%  Weight:      Height:       Weight change:   Physical Examination: General exam: AAox3,obese weak,older appearing HEENT:Oral mucosa moist, Ear/Nose WNL grossly, dentition normal. Respiratory system: bilaterally diminished BS, no use of accessory muscle Cardiovascular system: S1 & S2 +, regular rate. Gastrointestinal system: Abdomen soft, w/ chronic pain obese,ND,BS+ Nervous System:Alert, awake, moving extremities and grossly nonfocal Extremities: LE ankle edema ++, lower extremities warm Skin: No rashes,no icterus. MSK: Normal muscle bulk,tone, power  HD catheter+  Medications reviewed: Scheduled Meds:  allopurinol  50 mg Oral Once per day on Sun Wed   atorvastatin  10 mg Oral Daily   carvedilol  12.5 mg Oral BID WC   Chlorhexidine Gluconate Cloth  6 each Topical Daily   Chlorhexidine Gluconate Cloth  6 each Topical Q0600   Chlorhexidine Gluconate Cloth  6 each Topical Q0600   darbepoetin (ARANESP) injection - NON-DIALYSIS  40 mcg Subcutaneous Q Mon-1800   donepezil  5 mg Oral QHS   febuxostat  40 mg Oral Daily   ferrous sulfate  325 mg Oral BID WC   folic acid  1 mg Oral Daily   guaiFENesin  600 mg Oral BID   insulin aspart  0-5 Units Subcutaneous QHS   insulin aspart  0-6 Units Subcutaneous TID WC   insulin aspart  4 Units Subcutaneous TID WC   insulin glargine-yfgn  8 Units Subcutaneous QHS   mirtazapine  30 mg Oral QHS   pantoprazole  40 mg Oral BID  Continuous Infusions:  heparin 1,600 Units/hr (06/30/22 0537)     Diet Order             Diet NPO time specified Except for: Sips with Meds  Diet effective midnight                  Intake/Output Summary (Last 24 hours) at 06/30/2022 0907 Last data filed at 06/29/2022 1310 Gross per 24 hour  Intake 240 ml  Output --  Net 240  ml   Net IO Since Admission: -9,706.04 mL [06/30/22 0907]  Wt Readings from Last 3 Encounters:  06/28/22 (!) 144.1 kg  06/04/22 130.5 kg  05/13/22 (!) 139.2 kg     Unresulted Labs (From admission, onward)     Start     Ordered   07/01/22 0500  CBC  Daily,   R     Question:  Specimen collection method  Answer:  Lab=Lab collect   06/30/22 0847   06/30/22 0500  Heparin level (unfractionated)  Daily,   R     Question:  Specimen collection method  Answer:  Lab=Lab collect   06/29/22 0522          Data Reviewed: I have personally reviewed following labs and imaging studies CBC: Recent Labs  Lab 06/26/22 0316 06/27/22 0225 06/28/22 0255 06/29/22 0401 06/30/22 0501  WBC 13.2* 13.3* 14.0* 14.8* 16.4*  HGB 7.8* 7.5* 7.7* 7.8* 7.3*  HCT 23.1* 23.9* 23.9* 25.3* 22.2*  MCV 83.1 86.6 85.7 88.5 86.0  PLT 231 220 236 313 296   Basic Metabolic Panel: Recent Labs  Lab 06/25/22 0759 06/26/22 0316 06/27/22 0225 06/28/22 0255 06/30/22 0501  NA 138 133* 134* 131* 126*  K 3.3* 3.0* 3.2* 3.3* 3.7  CL 108 102 97* 96* 94*  CO2 14* 20* 24 22 20*  GLUCOSE 159* 195* 202* 213* 136*  BUN 103* 62* 36* 43* 33*  CREATININE 5.23* 4.14* 3.85* 4.80* 4.90*  CALCIUM 8.3* 8.1* 8.5* 8.6* 8.0*  PHOS 8.1*  --   --   --  5.2*   GFR: Estimated Creatinine Clearance: 18.4 mL/min (A) (by C-G formula based on SCr of 4.9 mg/dL (H)). Liver Function Tests: Recent Labs  Lab 06/25/22 0759 06/30/22 0501  ALBUMIN 2.0* 2.0*   No results for input(s): "LIPASE", "AMYLASE" in the last 168 hours. CBG: Recent Labs  Lab 06/28/22 2005 06/29/22 0726 06/29/22 1124 06/29/22 1622 06/29/22 2024  GLUCAP 298* 164* 180* 247* 156*   No results for input(s): "PROCALCITON", "LATICACIDVEN" in the last 168 hours.   Recent Results (from the past 240 hour(s))  Culture, blood (Routine X 2) w Reflex to ID Panel     Status: None (Preliminary result)   Collection Time: 06/28/22  7:01 AM   Specimen: BLOOD RIGHT ARM   Result Value Ref Range Status   Specimen Description BLOOD RIGHT ARM  Final   Special Requests   Final    BOTTLES DRAWN AEROBIC AND ANAEROBIC Blood Culture adequate volume   Culture   Final    NO GROWTH 2 DAYS Performed at [facility], [address] [place], [place] [address]    Report Status PENDING  Incomplete  Culture, blood (Routine X 2) w Reflex to ID Panel     Status: None (Preliminary result)   Collection  Time: 06/28/22  7:03 AM   Specimen: BLOOD  Result Value Ref Range Status   Specimen Description BLOOD RIGHT ANTECUBITAL  Final   Special Requests   Final    BOTTLES DRAWN AEROBIC ONLY Blood Culture results may not be optimal due to an inadequate volume of blood received in culture bottles   Culture   Final    NO GROWTH 2 DAYS Performed at [facility], [address] [place], [place] [address]    Report Status PENDING  Incomplete    Antimicrobials: Anti-infectives (From admission, onward)    Start     Dose/Rate Route Frequency Ordered Stop   06/28/22 0600  vancomycin (VANCOCIN) IVPB 1000 mg/200 mL premix  Status:  Discontinued        1,000 mg 200 mL/hr over 60 Minutes Intravenous On call to O.R. 06/27/22 1002 06/27/22 1023   06/24/22 1615  vancomycin (VANCOCIN) IVPB 1000 mg/200 mL premix        1,000 mg 200 mL/hr over 60 Minutes Intravenous  Once 06/24/22 1516 06/24/22 1729   06/21/22 1100  doxycycline (VIBRA-TABS) tablet 100 mg        100 mg Oral Every 12 hours 06/21/22 1002 06/26/22 0959   06/19/22 2300  ceFEPIme (MAXIPIME) 2 g in sodium chloride 0.9 % 100 mL IVPB  Status:  Discontinued        2 g 200 mL/hr over 30 Minutes Intravenous Every 24 hours 06/19/22 0156 06/21/22 1002   06/19/22 0200  vancomycin (VANCOREADY) IVPB 1500 mg/300 mL        1,500 mg 150 mL/hr over 120 Minutes Intravenous  Once 06/19/22 0156 06/19/22 0622   06/19/22 0157  vancomycin variable dose per unstable renal function (pharmacist dosing)  Status:  Discontinued         Does  not apply See admin instructions 06/19/22 0157 06/20/22 1123   06/18/22 2330  ceFEPIme (MAXIPIME) 2 g in sodium chloride 0.9 % 100 mL IVPB        2 g 200 mL/hr over 30 Minutes Intravenous  Once 06/18/22 2317 06/19/22 0007   06/18/22 2315  vancomycin (VANCOCIN) IVPB 1000 mg/200 mL premix        1,000 mg 200 mL/hr over 60 Minutes Intravenous  Once 06/18/22 2306 06/19/22 0203      Culture/Microbiology    Component Value Date/Time   SDES BLOOD RIGHT ANTECUBITAL 06/28/2022 0703   SPECREQUEST  06/28/2022 0703    BOTTLES DRAWN AEROBIC ONLY Blood Culture results may not be optimal due to an inadequate volume of blood received in culture bottles   CULT  06/28/2022 0703    NO GROWTH 2 DAYS Performed at [facility], [address], [place], [place] [address]    REPTSTATUS PENDING 06/28/2022 0703  Other culture-see note  Radiology Studies: No results found.  LOS: 11 days  Lanae Boast, MD [facility]  06/30/2022, 9:07 AM

## 2022-06-30 NOTE — Plan of Care (Signed)
  Problem: Activity: Goal: Ability to tolerate increased activity will improve Outcome: Progressing   Problem: Clinical Measurements: Goal: Ability to maintain a body temperature in the normal range will improve Outcome: Progressing   Problem: Respiratory: Goal: Ability to maintain adequate ventilation will improve Outcome: Progressing Goal: Ability to maintain a clear airway will improve Outcome: Progressing   Problem: Education: Goal: Knowledge of General Education information will improve Description: Including pain rating scale, medication(s)/side effects and non-pharmacologic comfort measures Outcome: Progressing   Problem: Health Behavior/Discharge Planning: Goal: Ability to manage health-related needs will improve Outcome: Progressing   Problem: Clinical Measurements: Goal: Ability to maintain clinical measurements within normal limits will improve Outcome: Progressing Goal: Will remain free from infection Outcome: Progressing Goal: Diagnostic test results will improve Outcome: Progressing Goal: Respiratory complications will improve Outcome: Progressing Goal: Cardiovascular complication will be avoided Outcome: Progressing   Problem: Activity: Goal: Risk for activity intolerance will decrease Outcome: Progressing   Problem: Nutrition: Goal: Adequate nutrition will be maintained Outcome: Progressing   Problem: Coping: Goal: Level of anxiety will decrease Outcome: Progressing   Problem: Elimination: Goal: Will not experience complications related to bowel motility Outcome: Progressing Goal: Will not experience complications related to urinary retention Outcome: Progressing   Problem: Pain Managment: Goal: General experience of comfort will improve Outcome: Progressing   Problem: Safety: Goal: Ability to remain free from injury will improve Outcome: Progressing   Problem: Skin Integrity: Goal: Risk for impaired skin integrity will decrease Outcome:  Progressing   Problem: Education: Goal: Ability to describe self-care measures that may prevent or decrease complications (Diabetes Survival Skills Education) will improve Outcome: Progressing Goal: Individualized Educational Video(s) Outcome: Progressing   Problem: Coping: Goal: Ability to adjust to condition or change in health will improve Outcome: Progressing   Problem: Fluid Volume: Goal: Ability to maintain a balanced intake and output will improve Outcome: Progressing   Problem: Health Behavior/Discharge Planning: Goal: Ability to identify and utilize available resources and services will improve Outcome: Progressing Goal: Ability to manage health-related needs will improve Outcome: Progressing   Problem: Metabolic: Goal: Ability to maintain appropriate glucose levels will improve Outcome: Progressing   Problem: Nutritional: Goal: Maintenance of adequate nutrition will improve Outcome: Progressing Goal: Progress toward achieving an optimal weight will improve Outcome: Progressing   Problem: Skin Integrity: Goal: Risk for impaired skin integrity will decrease Outcome: Progressing   Problem: Tissue Perfusion: Goal: Adequacy of tissue perfusion will improve Outcome: Progressing   Problem: Education: Goal: Knowledge of disease and its progression will improve Outcome: Progressing Goal: Individualized Educational Video(s) Outcome: Progressing   Problem: Fluid Volume: Goal: Compliance with measures to maintain balanced fluid volume will improve Outcome: Progressing   Problem: Health Behavior/Discharge Planning: Goal: Ability to manage health-related needs will improve Outcome: Progressing   Problem: Nutritional: Goal: Ability to make healthy dietary choices will improve Outcome: Progressing   Problem: Clinical Measurements: Goal: Complications related to the disease process, condition or treatment will be avoided or minimized Outcome: Progressing

## 2022-06-30 NOTE — Transfer of Care (Cosign Needed)
Immediate Anesthesia Transfer of Care Note  Patient: Deborah [name]  Procedure(s) Performed: LEFT BRACHIOCEPHALIC ARTERIOVENOUS (AV) FISTULA CREATION (Left: Arm Upper)  Patient Location: PACU  Anesthesia Type:General  Level of Consciousness: awake, alert , oriented, and patient cooperative  Airway & Oxygen Therapy: Patient Spontanous Breathing and Patient connected to nasal cannula oxygen  Post-op Assessment: Report given to RN and Post -op Vital signs reviewed and stable  Post vital signs: Reviewed and stable  Last Vitals:  Vitals Value Taken Time  BP 107/59 06/30/22 1221  Temp 37.2 C 06/30/22 1219  Pulse 92 06/30/22 1229  Resp 27 06/30/22 1229  SpO2 93 % 06/30/22 1229  Vitals shown include unvalidated device data.  Last Pain:  Vitals:   06/30/22 1219  TempSrc:   PainSc: 0-No pain      Patients Stated Pain Goal: 0 (06/29/22 0455)  Complications: No notable events documented.

## 2022-06-30 NOTE — Anesthesia Postprocedure Evaluation (Signed)
Anesthesia Post Note  Patient: Deborah [name]  Procedure(s) Performed: LEFT BRACHIOCEPHALIC ARTERIOVENOUS (AV) FISTULA CREATION (Left: Arm Upper)     Patient location during evaluation: PACU Anesthesia Type: General Level of consciousness: awake and alert Pain management: pain level controlled Vital Signs Assessment: post-procedure vital signs reviewed and stable Respiratory status: spontaneous breathing, nonlabored ventilation, respiratory function stable and patient connected to nasal cannula oxygen Cardiovascular status: blood pressure returned to baseline and stable Postop Assessment: no apparent nausea or vomiting Anesthetic complications: no   No notable events documented.  Last Vitals:  Vitals:   06/30/22 1700 06/30/22 1730  BP: 111/61 110/62  Pulse: 86 87  Resp: 19 16  Temp:    SpO2: 100% 100%    Last Pain:  Vitals:   06/30/22 1346  TempSrc: Oral  PainSc:                  Collene Schlichter

## 2022-06-30 NOTE — Progress Notes (Addendum)
I have seen and evaluated the patient this morning.  She is scheduled for left upper extremity AV fistula versus AV graft today with Dr. Chestine Spore.  She has been n.p.o. since midnight.  Repeat blood cultures with no growth for 2 days.  Afebrile today.   Plan for OR today, all questions have been answered about surgery.  Currently on heparin for atrial fibrillation.  Please hold heparin on-call to the OR.  Loel Dubonnet, PA-C [facility] 06/30/2022, 9:00AM

## 2022-06-30 NOTE — Progress Notes (Signed)
   06/30/22 1750  Vitals  Temp 98.1 F (36.7 C)  Pulse Rate 88  Resp 19  BP (!) 112/56  SpO2 100 %  Post Treatment  Dialyzer Clearance Lightly streaked  Duration of HD Treatment -hour(s) 3 hour(s)  Hemodialysis Intake (mL) 0 mL  Fluid Removed (mL) 2000 mL  Tolerated HD Treatment Yes   Received patient in bed to unit.  Alert and oriented.  Informed consent signed and in chart.   TX duration:3 hrs  Patient tolerated well.  Transported back to the room  Alert, without acute distress.  Hand-off given to patient's nurse.   Access used: [facility] Access issues: NONE  Total UF removed: 2L Medication(s) given: NONE    Deborah [name] [facility]

## 2022-06-30 NOTE — Progress Notes (Signed)
Pt left room via bed with OR staff to go to OR for placement of dialysis catheter.

## 2022-06-30 NOTE — Op Note (Signed)
OPERATIVE NOTE  DATE: June 30, 2022  PROCEDURE: left brachiocephalic arteriovenous fistula placement  PRE-OPERATIVE DIAGNOSIS: end stage renal disease  POST-OPERATIVE DIAGNOSIS: same as above   SURGEON: Cephus Shelling, MD  ASSISTANT(S): Doreatha Massed, PA  ANESTHESIA: general  ESTIMATED BLOOD LOSS: <50 mL  FINDING(S): 1.  Cephalic vein: 4 mm, acceptable 2.  Brachial artery: high bifurcation, 3 mm radial artery at antecubital fossa 3.  Venous outflow: palpable thrill  4.  Radial flow: palpable radial pulse  SPECIMEN(S):  none  INDICATIONS:   Deborah [name] is a 57 y.o. female who presents with end stage renal disease.  The patient is scheduled for left arm arteriovenous fistula placement.  The patient is aware the risks include but are not limited to: bleeding, infection, steal syndrome, nerve damage, ischemic monomelic neuropathy, failure to mature, and need for additional procedures.  The patient is aware of the risks of the procedure and elects to proceed forward.  DESCRIPTION: After full informed written consent was obtained from the patient, the patient was brought back to the operating room and placed supine upon the operating table.  Prior to induction, the patient received IV antibiotics.   After obtaining adequate anesthesia, the patient was then prepped and draped in the standard fashion for a left arm access procedure.  I turned my attention first to identifying the patient's cephalic vein and brachial artery.  The cephalic vein look to be of good caliber at the elbow.  She has a high brachial artery bifurcation and her radial and ulnar artery at the antecubitum are about the same size about 3 mm each.  I made a transverse incision at the level of the antecubitum and dissected through the subcutaneous tissue and fascia to gain exposure of the brachial artery.  This was noted to be 3 mm in diameter externally.  This was dissected out proximally and distally  and controlled with vessel loops .  I then dissected out the cephalic vein.  This was noted to be 4 mm in diameter externally.  The distal segment of the vein was ligated with a  2-0 silk, and the vein was transected.  The proximal segment was interrogated with serial dilators.  The vein accepted up to a 5 mm dilator with some difficulty suggesting a stenosis in the vein.  I then instilled the heparinized saline into the vein and clamped it.  At this point, I reset my exposure of the brachial artery (actual radial artery given high bifurcation).  The patient was given 5,000 units IV heparin.  I then placed the artery under tension proximally and distally.  I made an arteriotomy with a #11 blade, and then I extended the arteriotomy with a Potts scissor.  I injected heparinized saline proximal and distal to this arteriotomy.  The vein was then sewn to the artery in an end-to-side configuration with a running stitch of 6-0 Prolene.  Prior to completing this anastomosis, I allowed the vein and artery to backbleed.  There was no evidence of clot from any vessels.  I completed the anastomosis in the usual fashion and then released all vessel loops and clamps.    There was a palpable thrill in the venous outflow, and there was a palpable radial pulse.  At this point, I irrigated out the surgical wound.  There was no further active bleeding.  The subcutaneous tissue was reapproximated with a running stitch of 3-0 Vicryl.  The skin was then reapproximated with a running subcuticular  stitch of 4-0  Monocryl.  The skin was then cleaned, dried, and reinforced with Dermabond.  The patient tolerated this procedure well.   COMPLICATIONS: None  CONDITION: Stable  Cephus Shelling, MD [facility] Office: [phone]  Cephus Shelling   06/30/2022, 12:04 PM

## 2022-07-01 ENCOUNTER — Inpatient Hospital Stay (HOSPITAL_COMMUNITY): Payer: 59

## 2022-07-01 ENCOUNTER — Encounter (HOSPITAL_COMMUNITY): Payer: Self-pay | Admitting: Vascular Surgery

## 2022-07-01 DIAGNOSIS — R609 Edema, unspecified: Secondary | ICD-10-CM | POA: Diagnosis not present

## 2022-07-01 DIAGNOSIS — J189 Pneumonia, unspecified organism: Secondary | ICD-10-CM | POA: Diagnosis not present

## 2022-07-01 LAB — TYPE AND SCREEN: ABO/RH(D): O POS

## 2022-07-01 LAB — IRON AND TIBC
Iron: 48 ug/dL (ref 28–170)
Saturation Ratios: 29 % (ref 10.4–31.8)
TIBC: 168 ug/dL — ABNORMAL LOW (ref 250–450)
UIBC: 120 ug/dL

## 2022-07-01 LAB — GLUCOSE, CAPILLARY
Glucose-Capillary: 366 mg/dL — ABNORMAL HIGH (ref 70–99)
Glucose-Capillary: 356 mg/dL — ABNORMAL HIGH (ref 70–99)
Glucose-Capillary: 266 mg/dL — ABNORMAL HIGH (ref 70–99)

## 2022-07-01 LAB — RENAL FUNCTION PANEL
Albumin: 1.8 g/dL — ABNORMAL LOW (ref 3.5–5.0)
Anion gap: 12 (ref 5–15)
BUN: 37 mg/dL — ABNORMAL HIGH (ref 6–20)
CO2: 22 mmol/L (ref 22–32)
Calcium: 7.9 mg/dL — ABNORMAL LOW (ref 8.9–10.3)
Chloride: 93 mmol/L — ABNORMAL LOW (ref 98–111)
Creatinine, Ser: 4.35 mg/dL — ABNORMAL HIGH (ref 0.44–1.00)
GFR, Estimated: 11 mL/min — ABNORMAL LOW (ref >60)
Glucose, Bld: 412 mg/dL — ABNORMAL HIGH (ref 70–99)
Phosphorus: 5.3 mg/dL — ABNORMAL HIGH (ref 2.5–4.6)
Potassium: 3.8 mmol/L (ref 3.5–5.1)
Sodium: 127 mmol/L — ABNORMAL LOW (ref 135–145)

## 2022-07-01 LAB — CBC
HCT: 19.8 % — ABNORMAL LOW (ref 36.0–46.0)
Hemoglobin: 6.3 g/dL — CL (ref 12.0–15.0)
MCH: 27.5 pg (ref 26.0–34.0)
MCHC: 31.8 g/dL (ref 30.0–36.0)
MCV: 86.5 fL (ref 80.0–100.0)
Platelets: 317 10*3/uL (ref 150–400)
RBC: 2.29 MIL/uL — ABNORMAL LOW (ref 3.87–5.11)
RDW: 19.5 % — ABNORMAL HIGH (ref 11.5–15.5)
WBC: 17 10*3/uL — ABNORMAL HIGH (ref 4.0–10.5)
nRBC: 0 % (ref 0.0–0.2)

## 2022-07-01 LAB — CULTURE, BLOOD (ROUTINE X 2): Culture: NO GROWTH

## 2022-07-01 LAB — BPAM RBC

## 2022-07-01 LAB — HEPARIN LEVEL (UNFRACTIONATED): Heparin Unfractionated: 0.15 IU/mL — ABNORMAL LOW (ref 0.30–0.70)

## 2022-07-01 LAB — FERRITIN: Ferritin: 2460 ng/mL — ABNORMAL HIGH (ref 11–307)

## 2022-07-01 LAB — PREPARE RBC (CROSSMATCH)

## 2022-07-01 MED ORDER — CHLORHEXIDINE GLUCONATE CLOTH 2 % EX PADS
6.0000 | MEDICATED_PAD | Freq: Every day | CUTANEOUS | Status: DC
Start: 2022-07-02 — End: 2022-07-01

## 2022-07-01 MED ORDER — INSULIN GLARGINE-YFGN 100 UNIT/ML ~~LOC~~ SOLN
8.0000 [IU] | Freq: Two times a day (BID) | SUBCUTANEOUS | Status: DC
  Administered 2022-07-01 – 2022-07-02 (×3): 8 [IU] via SUBCUTANEOUS
  Filled 2022-07-01 (×4): qty 0.08

## 2022-07-01 MED ORDER — SODIUM CHLORIDE 0.9% IV SOLUTION: Freq: Once | INTRAVENOUS | Status: AC

## 2022-07-01 MED ORDER — APIXABAN 5 MG PO TABS
5.0000 mg | ORAL_TABLET | Freq: Two times a day (BID) | ORAL | Status: DC
  Administered 2022-07-01 – 2022-07-05 (×10): 5 mg via ORAL
  Filled 2022-07-01 (×10): qty 1

## 2022-07-01 NOTE — Progress Notes (Signed)
Met with pt at bedside this am to discuss out-pt HD arrangements. Schedule letter provided and pt agreeable to plan. Pt requesting to speak to RN CM regarding transportation. RN CM made aware. Will assist as needed.   Olivia Canter Renal Navigator [phone]

## 2022-07-01 NOTE — Progress Notes (Signed)
Upper extremity venous right study completed.   Please see CV Proc for preliminary results.   Wilder Amodei, RDMS, RVT  

## 2022-07-01 NOTE — Progress Notes (Addendum)
  Progress Note    07/01/2022 8:40 AM 1 Day Post-Op  Subjective:  no complaints    Vitals:   06/30/22 2141 07/01/22 0809  BP: 119/74 124/66  Pulse: 95 90  Resp: 16 20  Temp: (!) 97.4 F (36.3 C) (!) 97.5 F (36.4 C)  SpO2: 97% 100%    Physical Exam: General:  resting comfortably Lungs:  nonlabored Incisions:  left upper arm incision dry and intact, no hematoma Extremities:  palpable thrill of LUE AVF. Palpable left radial pulse. LUE sensory and motor intact  CBC    Component Value Date/Time   WBC 16.4 (H) 06/30/2022 0501   RBC 2.58 (L) 06/30/2022 0501   HGB 7.2 (L) 06/30/2022 1444   HGB 8.8 (L) 10/09/2019 0906   HGB 9.6 (L) 09/03/2019 1620   HCT 21.9 (L) 06/30/2022 1444   HCT 30.4 (L) 09/03/2019 1620   PLT 296 06/30/2022 0501   PLT 348 10/09/2019 0906   PLT 451 (H) 09/03/2019 1620   MCV 86.0 06/30/2022 0501   MCV 85 09/03/2019 1620   MCH 28.3 06/30/2022 0501   MCHC 32.9 06/30/2022 0501   RDW 19.6 (H) 06/30/2022 0501   RDW 13.6 09/03/2019 1620   LYMPHSABS 2.0 06/03/2022 0432   LYMPHSABS 2.2 09/03/2019 1620   MONOABS 1.0 06/03/2022 0432   EOSABS 0.0 06/03/2022 0432   EOSABS 0.3 09/03/2019 1620   BASOSABS 0.0 06/03/2022 0432   BASOSABS 0.1 09/03/2019 1620    BMET    Component Value Date/Time   NA 126 (L) 06/30/2022 0501   NA 137 09/20/2019 1530   K 3.7 06/30/2022 0501   CL 94 (L) 06/30/2022 0501   CO2 20 (L) 06/30/2022 0501   GLUCOSE 136 (H) 06/30/2022 0501   BUN 33 (H) 06/30/2022 0501   BUN 18 09/20/2019 1530   CREATININE 4.90 (H) 06/30/2022 0501   CALCIUM 8.0 (L) 06/30/2022 0501   GFRNONAA 10 (L) 06/30/2022 0501   GFRAA 49 (L) 11/30/2019 0356    INR    Component Value Date/Time   INR 1.1 10/08/2020 0017     Intake/Output Summary (Last 24 hours) at 07/01/2022 0840 Last data filed at 07/01/2022 0300 Gross per 24 hour  Intake 576 ml  Output 2510 ml  Net -1934 ml      Assessment/Plan:  57 y.o. female is 1 day post op, s/p: creation of  left brachiocephalic fistula   -Patient is doing fine postoperatively. Having some soreness at the incision site -No pain in the left hand. LUE motor and sensory intact. Palpable left radial pulse -LUE brachiocephalic fistula with palpable thrill. Patient will continue to use catheter for dialysis until fistula is mature. We will arrange follow up with our office in 5-6 wks for incision check and fistula duplex -Okay to restart Eliquis from vascular standpoint   Loel Dubonnet, PA-C [facility] [phone] 07/01/2022 8:40 AM   I have seen and evaluated the patient. I agree with the PA note as documented above.  Postop day 1 status post left brachiocephalic AV fistula.  Excellent thrill in the fistula.  Palpable radial pulse.  No signs of steal syndrome.  Incision looks good.  Discussed follow-up in 4 to 6 weeks with duplex in the office that we will arrange.  Likely will need superficialization.  Call vascular with questions or concerns.  Cephus Shelling, MD [facility] [place] Office: [phone]

## 2022-07-01 NOTE — Progress Notes (Signed)
Pt refused heparin draw. pharmacist notified

## 2022-07-01 NOTE — Progress Notes (Signed)
PROGRESS NOTE Deborah [name]  AVW:[id number] DOB: 09-08-1965 DOA: 06/18/2022 PCP: Elsie Amis, MD  Brief Narrative/Hospital Course: 57 y.o.f w/ medical history significant for insulin-dependent diabetes mellitus, hypertension, PAF on Eliquis, CKD stage IV, iron deficiency anemia, chronic pain, duodenal carcinoid tumor resected in 2021 and recent admission 05/28/22 -06/04/22 with diagnosis of acute metabolic encephalopathy due to dehydration with associated AKI on CKDIV, with course complicated by disseminated gout and discharged on colchine, Uloric  and allopurinol as well as steroid taper to SNF presented with worsening shortness of breath, fluid overload past 24 hours In the WG:NFAOZH has revealed multiple opacities on chest x-ray, COVID-19 is negative and respiratory viral panel is positive for metapneumovirus infection.  Placed on empiric antibiotics, seen by nephrology for fluid overload and placed on high-dose Lasix without improvement. Subsequently transferred to [facility] 4/18 for dialysis and access Underwent right IJ tunneled HD catheter placement by IR 4/18 Seen by Dr. Lenell Antu from vascular for HD access advised supportive left upper extremity from further venipuncture and awaiting vein mapping  Subjective: Seen and examined this morning. Had episode of fever yesterday morning afebrile since then.   Underwent creation of left brachiocephalic fistula 0/86 , rt arm with iv lien and heparin gtt-requesting to remove IV line from the right arm due to difficulty moving both of her arms. Refused heparin lab draw this morning.   Doing well on room air   Assessment and Plan: Principal Problem:   HCAP (healthcare-associated pneumonia)  Metapneumovirus multifocal pneumonia right more than left Fever and leukocytosis: Initially received vancomycin and cefepime,blood culture NGTD>transitioned to doxycycline and completed. Respiratory status stable, had leukocytosis which is  fluctuating, CBC pending today. Has had intermittent fever no fever since yesterday morning. Continue to monitor temperature,blood culture from 422 NGTD Recent Labs  Lab 06/26/22 0316 06/27/22 0225 06/28/22 0255 06/29/22 0401 06/30/22 0501  WBC 13.2* 13.3* 14.0* 14.8* 16.4*    Acute on chronic kidney disease stage IV Bilateral lower extremity edema: B/L creat 2.1-3.1 from March 2023>4.8 creatinine on admission.  AKI in the setting of metapneumovirus respiratory infection and with fluid overload.UA unremarkable CT abdomen without obstruction, placed on high-dose Lasix without improvement in leg edema despite good urine output >subsequently s/p tunneled HD placement and dialysis initiated 4/19.  >Continue HD per nephrology w/ [facility] >S/P creation of left brachiocephalic fistula 5/78-[id number] of soreness at the incision site, no pain in the left hand left upper extremity motor and sensory intact per vascular with palpable radial pulse and okay to resume Eliquis.  Complains of pain and swelling in the right upper arm duplex ordered she refused IV line and it has been removed from  right arm >Avoid nephrotoxic medications including NSAIDs and iodinated intravenous contrast exposure unless the latter is absolutely indicated.Preferred narcotic agents for pain control UXL:[id number], fentanyl, and methadone and avoid morphine.Avoid Baclofen and avoid oral sodium phosphate and magnesium citrate based laxatives / bowel preps. Continue strict Input and Output monitoring and serial renal functions. Outpatient HD being set up Net IO Since Admission: -11,640.04 mL [07/01/22 1056]  Filed Weights   06/26/22 2002 06/28/22 0811 06/28/22 1203  Weight: (!) 143.8 kg (!) 146.4 kg (!) 144.1 kg   Hypokalemia:resolved, changed On Lower Side Metabolic acidosis in the setting of CKD and AKI monitor.  Hypertension Chronic CHF with preserved EF: BP stable continue Coreg, continue fluid management  w/ HD per  Nephro  Atrial fibrillation rate controlled on Coreg, resuming eliquis today.  Refused heparin this morning  Anemia multifactorial from IDA and CKD Acute blood loss anemia:  Hemoglobin stable but low> Pt had 2 u prbc done on 4/13.S H&H on recheck came back at 6.3 will give 1 unit PRBC today and check H&H posttransfusion likely acute blood loss anemia recent AV fistula versus anemia from chronic and acute illness.Cont iron supplement, folate and monitor. Recent Labs  Lab 06/27/22 0225 06/28/22 0255 06/29/22 0401 06/30/22 0501 06/30/22 1444  HGB 7.5* 7.7* 7.8* 7.3* 7.2*  HCT 23.9* 23.9* 25.3* 22.2* 21.9*    Hyperlipidemia continue statin  Chronic pain on abdomen Lumbar stenosis On chronic opiates: CT renal study without obvious acute finding, has diverticulosis.  LFTs are stable.  Lipase mildly elevated but in the setting of CKD/AKI. On Dilaudid mg every 8 hours prn at Home, reports she as been doubling the dose if she has worsening pain at home She requests IV dilaudid soemtime here. We discussed about minimizing opiates for chronic pain   Hyperuricemia/gout with recent flare up and disseminated into hyperuricemia and encephalopathy: Uric acid now down to 8, was 16.5 at previous admission.Continue colchicine,uloric.completed prednisone taper previously.   Type 2 diabetes mellitus history of HHS: HbA1c poorly controlled 8.1 blood sugar poorly, continue Semglee to 14u bedtime> will give additional 6 unit now. Cont premeal and ssi and monitor. She is noncompliant with diet Recent Labs  Lab 06/30/22 1220 06/30/22 1411 06/30/22 1828 06/30/22 2138 07/01/22 0736  GLUCAP 204* 241* 193* 328* 366*   Noncompliant with treatment/diets: She has been requested to remove IV line and iv stick, and  heparin had to be  discontinued, also requesting regular diet while her sugar remains uncontrolled. Patient was belligerent verbally aggressive today as she does not want to be on diabetic diet, mother  at the bedside, could not calm her down.  Changed her to regular diet  Morbid Obesity:Patient's Body mass index is 51.28 kg/m. : Will benefit with PCP follow-up, weight loss  healthy lifestyle and outpatient sleep evaluation.  DVT prophylaxis: Eliquis Code Status:   Code Status: Full Code Family Communication: plan of care discussed with patient at bedside. Patient status is: Inpatient because of need for dialysis Level of care: Telemetry Medical   Dispo: The patient is from: SNF            Anticipated disposition: SNF 1-2 days  Objective: Vitals last 24 hrs: Vitals:   06/30/22 1750 06/30/22 1828 06/30/22 2141 07/01/22 0809  BP: (!) 112/56 124/68 119/74 124/66  Pulse: 88 91 95 90  Resp: Temp: 98.1 F (36.7 C) 97.9 F (36.6 C) (!) 97.4 F (36.3 C) (!) 97.5 F (36.4 C)  TempSrc:  Oral Oral Oral  SpO2: 100% 98% 97% 100%  Weight:      Height:       Weight change:   Physical Examination: General exam: AA oriented x 3 obese, weak,older appearing HEENT:Oral mucosa moist, Ear/Nose WNL grossly, dentition normal. Respiratory system: bilaterally diminished BS, no use of accessory muscle Cardiovascular system: S1 & S2 +, regular rate, JVD neg. Gastrointestinal system: Abdomen soft, obeseNT,ND,BS+ Nervous System:Alert, awake, moving her extremities Extremities: LE ankle edema ++, lower extremities warm Skin: No rashes,no icterus. MSK: Normal muscle bulk,tone, power , left arm w/ dressing in place HD catheter+  Medications reviewed: Scheduled Meds:  allopurinol  50 mg Oral Once per day on Sun Wed   atorvastatin  10 mg Oral Daily   carvedilol  12.5 mg Oral BID WC   Chlorhexidine Gluconate  Cloth  6 each Topical Daily   Chlorhexidine Gluconate Cloth  6 each Topical Q0600   Chlorhexidine Gluconate Cloth  6 each Topical Q0600   darbepoetin (ARANESP) injection - NON-DIALYSIS  40 mcg Subcutaneous Q Mon-1800   donepezil  5 mg Oral QHS   febuxostat  40 mg Oral Daily    ferrous sulfate  325 mg Oral BID WC   folic acid  1 mg Oral Daily   guaiFENesin  600 mg Oral BID   insulin aspart  0-5 Units Subcutaneous QHS   insulin aspart  0-6 Units Subcutaneous TID WC   insulin aspart  4 Units Subcutaneous TID WC   insulin glargine-yfgn  8 Units Subcutaneous QHS   mirtazapine  30 mg Oral QHS   pantoprazole  40 mg Oral BID  Continuous Infusions:  heparin Stopped (07/01/22 1043)     Diet Order             Diet regular Room service appropriate? Yes; Fluid consistency: Thin; Fluid restriction: 1200 mL Fluid  Diet effective now                  Intake/Output Summary (Last 24 hours) at 07/01/2022 1056 Last data filed at 07/01/2022 0300 Gross per 24 hour  Intake 576 ml  Output 2010 ml  Net -1434 ml   Net IO Since Admission: -11,640.04 mL [07/01/22 1056]  Wt Readings from Last 3 Encounters:  06/28/22 (!) 144.1 kg  06/04/22 130.5 kg  05/13/22 (!) 139.2 kg     Unresulted Labs (From admission, onward)     Start     Ordered   07/02/22 0500  Basic metabolic panel  Daily,   R     Question:  Specimen collection method  Answer:  Lab=Lab collect   07/01/22 0920   07/02/22 0500  CBC  Daily,   R     Question:  Specimen collection method  Answer:  Lab=Lab collect   07/01/22 0920   07/01/22 1000  Heparin level (unfractionated)  Once-Timed,   TIMED       Question:  Specimen collection method  Answer:  Lab=Lab collect   07/01/22 0125   07/01/22 0921  CBC  ONCE - URGENT,   URGENT       Question:  Specimen collection method  Answer:  Lab=Lab collect   07/01/22 0920   06/30/22 0500  Heparin level (unfractionated)  Daily,   R     Question:  Specimen collection method  Answer:  Lab=Lab collect   06/29/22 0522          Data Reviewed: I have personally reviewed following labs and imaging studies CBC: Recent Labs  Lab 06/26/22 0316 06/27/22 0225 06/28/22 0255 06/29/22 0401 06/30/22 0501 06/30/22 1444  WBC 13.2* 13.3* 14.0* 14.8* 16.4*  --   HGB 7.8* 7.5*  7.7* 7.8* 7.3* 7.2*  HCT 23.1* 23.9* 23.9* 25.3* 22.2* 21.9*  MCV 83.1 86.6 85.7 88.5 86.0  --   PLT 231 220 236 313 296  --    Basic Metabolic Panel: Recent Labs  Lab 06/25/22 0759 06/26/22 0316 06/27/22 0225 06/28/22 0255 06/30/22 0501  NA 138 133* 134* 131* 126*  K 3.3* 3.0* 3.2* 3.3* 3.7  CL 108 102 97* 96* 94*  CO2 14* 20* 24 22 20*  GLUCOSE 159* 195* 202* 213* 136*  BUN 103* 62* 36* 43* 33*  CREATININE 5.23* 4.14* 3.85* 4.80* 4.90*  CALCIUM 8.3* 8.1* 8.5* 8.6* 8.0*  PHOS 8.1*  --   --   --  5.2*   GFR: Estimated Creatinine Clearance: 18.4 mL/min (A) (by C-G formula based on SCr of 4.9 mg/dL (H)). Liver Function Tests: Recent Labs  Lab 06/25/22 0759 06/30/22 0501  ALBUMIN 2.0* 2.0*   No results for input(s): "LIPASE", "AMYLASE" in the last 168 hours. CBG: Recent Labs  Lab 06/30/22 1220 06/30/22 1411 06/30/22 1828 06/30/22 2138 07/01/22 0736  GLUCAP 204* 241* 193* 328* 366*   No results for input(s): "PROCALCITON", "LATICACIDVEN" in the last 168 hours.   Recent Results (from the past 240 hour(s))  Culture, blood (Routine X 2) w Reflex to ID Panel     Status: None (Preliminary result)   Collection Time: 06/28/22  7:01 AM   Specimen: BLOOD RIGHT ARM  Result Value Ref Range Status   Specimen Description BLOOD RIGHT ARM  Final   Special Requests   Final    BOTTLES DRAWN AEROBIC AND ANAEROBIC Blood Culture adequate volume   Culture   Final    NO GROWTH 3 DAYS Performed at [facility], [address] [place], [place] [address]    Report Status PENDING  Incomplete  Culture, blood (Routine X 2) w Reflex to ID Panel     Status: None (Preliminary result)   Collection Time: 06/28/22  7:03 AM   Specimen: BLOOD  Result Value Ref Range Status   Specimen Description BLOOD RIGHT ANTECUBITAL  Final   Special Requests   Final    BOTTLES DRAWN AEROBIC ONLY Blood Culture results may not be optimal due to an inadequate volume of blood received in culture  bottles   Culture   Final    NO GROWTH 3 DAYS Performed at [facility], [address], [place], [place] [address]    Report Status PENDING  Incomplete    Antimicrobials: Anti-infectives (From admission, onward)    Start     Dose/Rate Route Frequency Ordered Stop   06/30/22 1030  vancomycin (VANCOCIN) IVPB 1000 mg/200 mL premix  Status:  Discontinued        1,000 mg 200 mL/hr over 60 Minutes Intravenous  Once 06/30/22 1021 06/30/22 1025   06/30/22 1030  vancomycin (VANCOREADY) IVPB 1500 mg/300 mL        1,500 mg 150 mL/hr over 120 Minutes Intravenous  Once 06/30/22 1026 06/30/22 1259   06/30/22 1022  vancomycin (VANCOCIN) 1-5 GM/200ML-% IVPB       Note to Pharmacy: Susy [place] L: cabinet override      06/30/22 1022 06/30/22 2229   06/28/22 0600  vancomycin (VANCOCIN) IVPB 1000 mg/200 mL premix  Status:  Discontinued        1,000 mg 200 mL/hr over 60 Minutes Intravenous On call to O.R. 06/27/22 1002 06/27/22 1023   06/24/22 1615  vancomycin (VANCOCIN) IVPB 1000 mg/200 mL premix        1,000 mg 200 mL/hr over 60 Minutes Intravenous  Once 06/24/22 1516 06/24/22 1729   06/21/22 1100  doxycycline (VIBRA-TABS) tablet 100 mg        100 mg Oral Every 12 hours 06/21/22 1002 06/26/22 0959   06/19/22 2300  ceFEPIme (MAXIPIME) 2 g in sodium chloride 0.9 % 100 mL IVPB  Status:  Discontinued        2 g 200 mL/hr over 30 Minutes Intravenous Every 24 hours 06/19/22 0156 06/21/22 1002   06/19/22 0200  vancomycin (VANCOREADY) IVPB 1500 mg/300 mL        1,500 mg 150 mL/hr over 120 Minutes Intravenous  Once 06/19/22  8413 06/19/22 0622   06/19/22 0157  vancomycin variable dose per unstable renal function (pharmacist dosing)  Status:  Discontinued         Does not apply See admin instructions 06/19/22 0157 06/20/22 1123   06/18/22 2330  ceFEPIme (MAXIPIME) 2 g in sodium chloride 0.9 % 100 mL IVPB        2 g 200 mL/hr over 30 Minutes Intravenous  Once 06/18/22 2317 06/19/22 0007   06/18/22  2315  vancomycin (VANCOCIN) IVPB 1000 mg/200 mL premix        1,000 mg 200 mL/hr over 60 Minutes Intravenous  Once 06/18/22 2306 06/19/22 0203      Culture/Microbiology    Component Value Date/Time   SDES BLOOD RIGHT ANTECUBITAL 06/28/2022 0703   SPECREQUEST  06/28/2022 0703    BOTTLES DRAWN AEROBIC ONLY Blood Culture results may not be optimal due to an inadequate volume of blood received in culture bottles   CULT  06/28/2022 0703    NO GROWTH 3 DAYS Performed at [facility], [address], [place], [place] [address]    REPTSTATUS PENDING 06/28/2022 0703  Other culture-see note  Radiology Studies: No results found.  LOS: 12 days  Lanae Boast, MD [facility]  07/01/2022, 10:56 AM

## 2022-07-01 NOTE — Consult Note (Signed)
WOC Nurse wound follow up Requested to reassess left heel wound.  This was noted as a Stage 2 pressure injury which was present on admission; refer to previous WOC consult note on 4/14.  Wound type: Loose peeling darker-colored skin removes easily from 90% of the wound, revealing pink dry skin and 10% loose darker colored skin remain in place to the wound edges.  7X7X.1cm.  Measurement: Dressing procedure/placement/frequency: Float heel to reduce pressure on a pillow.  Pt is getting OOB to commode and Prevalon boot is no longer appropriate. Topical treatment orders provided for bedside nurses to perform as follows: Foam dressing to left heel, change Q 3 days or PRN soiling. Please re-consult if further assistance is needed.  Thank-you,  Cammie Mcgee MSN, RN, CWOCN, [place], CNS [phone]

## 2022-07-01 NOTE — Progress Notes (Signed)
Admit: 06/18/2022 LOS: 12  5F AoCKD4, multifocal pneumonia likley 2/2 metapneumovirus, recent admission for disseminated gout with encephalopathy and AKI, just discharged from SNF.   Subjective:   She had 500 mL UOP over 4/24.  Last HD on 4/24 with 2 kg UF.  I spoke with the patient as well as her mother at bedside.  Her brother joined via speakerphone.  She has had swelling of her arms since surgery.  She states that her brother is supportive of her starting dialysis - has been on dialysis himself.  She wants to get back to being more functional again.  She feels like she would benefit from oxygen at home and I discussed that they would need to see if she drops her oxygen saturations and qualifies for this.  She and I did thoughtfully discuss today that I do think that she is ESRD.  She states that she remembers talking about this with Dr. Arlean Hopping, too, but was hoping she might be able to come off of dialysis.  We discussed that given her history and fluid overload that I do feel coming off is less likely.   Review of systems:    Denies n/v Denies shortness of breath Denies chest pain  Her arm feels weaker after surgery and swollen She got to the bedside commode yesterday but had to wait quite a bit of time for assistance to get back to the bed.  Commode here is lower than one at home    Physical Exam:  Blood pressure 124/66, pulse 90, temperature (!) 97.5 F (36.4 C), temperature source Oral, resp. rate 20, height  (1.676 m), weight (!) 144.1 kg, last menstrual period 10/10/2012, SpO2 100 %. General adult female in bed in no acute distress    HEENT normocephalic atraumatic extraocular movements intact sclera anicteric Neck supple trachea midline Lungs clear to auscultation bilaterally normal work of breathing at rest on room air Heart S1S2 no rub Abdomen soft nontender obese habitus  Extremities diffuse 2-3+ edema lower extremities  Psych normal mood and affect Neuro - alert and  oriented x 3 provides history and follows commands Access RIJ tunn catheter; LUE AVF with bruit and thrill      Date   Creat  eGFR  2018-2020  1.01- 1.39   2021   1.31- 2.70  2022   1.64- 4.38  2023   1.72- 6.25 Mar 2022  3.02- 5.93 8- 17 ml/min  March 2024  1.92- 4.59 10-30 ml/min  4/12   4.84  10 ml/min    4/13   4.82  4/14   4.83  06/20/21  4.99  9 ml/min     B/l creat 2.1- 3.1 from march 2023, eGFR 17- 28 ml/ min   UA 11-20 wbc, 5-10 rbc from 06/19/22, prot 30   CT abd renal --> normal appearing kidneys w/o obstruction    Assessment/ Plan AoCKD4 with progression to ESRD Recently with variable Creatinine 2.7 - 4.  Creat later here 4.8 on admission in setting of MPV resp infection and significant lower ext edema. Followed by Dr Thedore Mins at [facility]. UA unremarkable, CT abd w/o obstruction.  Per charting, Lasix started at 40 mg bid IV then titrated up to  IV tid but no sig improvement in wt's or edema. Pt's brother (esrd on HD) called and discussed with team that her QOL is "horrible" and has been for the last couple of years due to her chronic fluid overload issues and he doesn't see her  getting better. He felt she would do better on dialysis. Nephrology d/w the patient and she acknowledged very difficult times lately due to chronic fluid overload refractory to diuretics.  TDC placed by IR and pt had her 1st HD Friday 4/19.  She had a left brachiocephalic AVF placed on 4/24 with Dr. Chestine Spore   HD per MWF schedule She has been accepted to [facility] on MWF schedule    I have ordered daily weights  Renal panel today - will follow-up  Metapneumovirus - admit diagnosis, w/ multifocal pneumonia R > L, sp IV vanc + cefepime, BCx NGTD. s/p po doxy.  Fluid volume overload - improving w/ dialysis Hypertension - controlled; optimize volume with HD.  If BP drops may need to lower the coreg.   Gout - with severe hyperuricemia; per primary team  Anemia of CKD -   transfuse per primary team.  PRBC's are ordered for today and agree.  Started aranesp 40 mcg every Monday.  Add-on iron panel   HFpEF - optimize volume with HD  DM2 with history of HHS.  Per primary team  Atrial fibrillation - per primary team.  Note that her only BP agent is also directed at her afib   Disposition - disposition per primary team    Recent Labs  Lab 06/25/22 0759 06/26/22 0316 06/28/22 0255 06/29/22 0401 06/30/22 0501 06/30/22 1444 07/01/22 1107  HGB 7.4*   < > 7.7*   < > 7.3* 7.2* 6.3*  ALBUMIN 2.0*  --   --   --  2.0*  --   --   CALCIUM 8.3*   < > 8.6*  --  8.0*  --   --   PHOS 8.1*  --   --   --  5.2*  --   --   CREATININE 5.23*   < > 4.80*  --  4.90*  --   --   K 3.3*   < > 3.3*  --  3.7  --   --    < > = values in this interval not displayed.    Inpatient medications:  sodium chloride   Intravenous Once   allopurinol  50 mg Oral Once per day on Sun Wed   atorvastatin  10 mg Oral Daily   carvedilol  12.5 mg Oral BID WC   Chlorhexidine Gluconate Cloth  6 each Topical Daily   Chlorhexidine Gluconate Cloth  6 each Topical Q0600   Chlorhexidine Gluconate Cloth  6 each Topical Q0600   darbepoetin (ARANESP) injection - NON-DIALYSIS  40 mcg Subcutaneous Q Mon-1800   donepezil  5 mg Oral QHS   febuxostat  40 mg Oral Daily   ferrous sulfate  325 mg Oral BID WC   folic acid  1 mg Oral Daily   guaiFENesin  600 mg Oral BID   insulin aspart  0-5 Units Subcutaneous QHS   insulin aspart  0-6 Units Subcutaneous TID WC   insulin aspart  4 Units Subcutaneous TID WC   insulin glargine-yfgn  8 Units Subcutaneous BID   mirtazapine  30 mg Oral QHS   pantoprazole  40 mg Oral BID    heparin Stopped (07/01/22 1043)    albuterol, guaiFENesin, HYDROmorphone, loperamide, ondansetron **OR** ondansetron (ZOFRAN) IV, mouth rinse    Estanislado Emms, MD 2:20 PM 07/01/2022

## 2022-07-01 NOTE — TOC Progression Note (Addendum)
Transition of Care [facility]) - Progression Note    Patient Details  Name: Deborah [name] MRN: [id number] Date of Birth: Jan 28, 1966  [facility] CM/SW Contact  Tom-Johnson, Hershal Coria, RN Phone Number: 07/01/2022, 11:20 AM  Clinical Narrative:     Patient requested transportation to and from outpatient dialysis. Patient has Medicaid. CM called and spoke with Narda Amber [phone]) about Medicaid transportation application. Narda Amber states patient's Medicaid will expire in May but will call patient in 24/48 hrs to do an assessment.  Patient also states her hospital bed mattress has a hole in it and requesting a new one. CM notified Barbara Cower with [employer] and he will take care of it.  CM will continue to follow as patient progresses with care towards discharge.  16:00- Patient request to speak with CM. CM called in to patient's room and she requested a low air loss mattress. States the one she currently has is a regular mattress.  CM called Barbara Cower with [employer] and notified him of patient's request. Barbara Cower requested  a new order placed for this mattress with narrative.  MD notified to place order with narrative. CM will continue to follow.      Expected Discharge Plan: Home/Self Care Barriers to Discharge: Continued Medical Work up  Expected Discharge Plan and Services   Discharge Planning Services: CM Consult   Living arrangements for the past 2 months: Apartment                                       Social Determinants of Health (SDOH) Interventions SDOH Screenings   Food Insecurity: No Food Insecurity (06/21/2022)  Recent Concern: Food Insecurity - Food Insecurity Present (05/29/2022)  Housing: Low Risk  (06/21/2022)  Recent Concern: Housing - High Risk (05/29/2022)  Transportation Needs: No Transportation Needs (06/21/2022)  Recent Concern: Transportation Needs - Unmet Transportation Needs (06/19/2022)  Utilities: Not At Risk (06/21/2022)  Recent Concern:  Utilities - At Risk (06/19/2022)  Depression (PHQ2-9): Medium Risk (09/16/2021)  Tobacco Use: Low Risk  (07/01/2022)    Readmission Risk Interventions     No data to display

## 2022-07-01 NOTE — Plan of Care (Signed)

## 2022-07-01 NOTE — Progress Notes (Signed)
New Dialysis Start    Patient identified as new dialysis start. Kidney Education packet assembled and given. Discussed the following items with patient:     Current medications and possible changes once started:  Discussed that patient's medications may change over time.  Ex; hypertension medications and diabetes medication.  Nephrologists will adjust as needed.   Fluid restrictions reviewed:  32 oz daily goal:  All liquids count; soups, ice, jello, fruits.    Phosphorus and potassium: Handout given showing high potassium and phosphorus foods.  Alternative food and drink options given.   Family support:  Brother is on dialysis but she stated that she lives alone with no help. Will need help with transportation to and from dialysis.   Outpatient Clinic Resources:  Discussed roles of Outpatient clinic staff and advised to make a list of needs, if any, to talk with outpatient staff if needed.   Care plan schedule: Informed patient of Care Plans in outpatient setting and to participate in the care plan.  An invitation would be given from outpatient clinic.    Dialysis Access Options:  Reviewed access options with patients. Discussed in detail about care at home with new AVG & AVF. Reviewed checking bruit and thrill. If dialysis catheter present, educated that patient could not take showers.  Catheter dressing changes were to be done by outpatient clinic staff only.   Home therapy options:  Educated patient about home therapy options:  PD vs home hemo.     Patient verbalized understanding. Will continue to round on patient during admission.  Patient stated that she is concerned about going home, being that she will be alone. She states that she is not at her baseline and is not able to help herself with basic tasks, attending MD made aware.  Jean Rosenthal Dialysis Nurse Coordinator [phone]

## 2022-07-01 NOTE — Progress Notes (Signed)
ANTICOAGULATION CONSULT NOTE - follow-up  Pharmacy Consult for heparin Indication: atrial fibrillation Allergies: valium, gabapentin, contrast, lisinopril, reglan, NSAIDS, PCN Tolmetin, dicyclomine, oxycodone, tylenol. Flexeril, rifamycin, tramadol  Patient Measurements: Height:  (167.6 cm) Weight: (!) 144.1 kg (317 lb 10.9 oz) (BED) IBW/kg (Calculated) : 59.3 Heparin Dosing Weight: 94.8 kg   Vital Signs: Temp: 97.5 F (36.4 C) (04/25 0809) Temp Source: Oral (04/25 0809) BP: 124/66 (04/25 0809) Pulse Rate: 90 (04/25 0809)  Labs: Recent Labs    06/28/22 2010 06/28/22 2010 06/29/22 0401 06/30/22 0501 06/30/22 0717 06/30/22 1444 07/01/22 1107  HGB  --    < > 7.8* 7.3*  --  7.2* 6.3*  HCT  --    < > 25.3* 22.2*  --  21.9* 19.8*  PLT  --   --  313 296  --   --  317  APTT 74*  --  58*  --  55*  --   --   HEPARINUNFRC 0.99*  --  0.89*  --  0.42  --  0.15*  CREATININE  --   --   --  4.90*  --   --   --    < > = values in this interval not displayed.    Estimated Creatinine Clearance: 18.4 mL/min (A) (by C-G formula based on SCr of 4.9 mg/dL (H)).  Assessment: Patient is a 25 yof with hx Afib admitted for HCAP. Heparin was initiated by pharmacy while Eliquis was held for left brachiocephalic fistula placement. Patient is POD1 of procedure with orders to transition back to eliquis  Home dose: Eliquis  po BID  Goal of Therapy:  Monitor platelets by anticoagulation protocol: Yes   Plan:  DC Heparin gtt and associated labs Eliquis  po BID  Caryl Asp, PharmD Clinical Pharmacist 07/01/2022 2:11 PM

## 2022-07-02 DIAGNOSIS — J189 Pneumonia, unspecified organism: Secondary | ICD-10-CM | POA: Diagnosis not present

## 2022-07-02 LAB — CBC
HCT: 23 % — ABNORMAL LOW (ref 36.0–46.0)
Hemoglobin: 7.4 g/dL — ABNORMAL LOW (ref 12.0–15.0)
MCH: 28.2 pg (ref 26.0–34.0)
MCHC: 32.2 g/dL (ref 30.0–36.0)
MCV: 87.8 fL (ref 80.0–100.0)
Platelets: 338 10*3/uL (ref 150–400)
RBC: 2.62 MIL/uL — ABNORMAL LOW (ref 3.87–5.11)
RDW: 18.6 % — ABNORMAL HIGH (ref 11.5–15.5)
WBC: 16.1 10*3/uL — ABNORMAL HIGH (ref 4.0–10.5)
nRBC: 0 % (ref 0.0–0.2)

## 2022-07-02 LAB — BPAM RBC: Unit Type and Rh: 5100

## 2022-07-02 LAB — BASIC METABOLIC PANEL
Anion gap: 15 (ref 5–15)
BUN: 42 mg/dL — ABNORMAL HIGH (ref 6–20)
CO2: 23 mmol/L (ref 22–32)
Calcium: 8.3 mg/dL — ABNORMAL LOW (ref 8.9–10.3)
Chloride: 96 mmol/L — ABNORMAL LOW (ref 98–111)
Creatinine, Ser: 4.85 mg/dL — ABNORMAL HIGH (ref 0.44–1.00)
GFR, Estimated: 10 mL/min — ABNORMAL LOW (ref >60)
Glucose, Bld: 335 mg/dL — ABNORMAL HIGH (ref 70–99)
Potassium: 3.4 mmol/L — ABNORMAL LOW (ref 3.5–5.1)
Sodium: 134 mmol/L — ABNORMAL LOW (ref 135–145)

## 2022-07-02 LAB — TYPE AND SCREEN: Unit division: 0

## 2022-07-02 LAB — GLUCOSE, CAPILLARY
Glucose-Capillary: 186 mg/dL — ABNORMAL HIGH (ref 70–99)
Glucose-Capillary: 122 mg/dL — ABNORMAL HIGH (ref 70–99)
Glucose-Capillary: 244 mg/dL — ABNORMAL HIGH (ref 70–99)
Glucose-Capillary: 219 mg/dL — ABNORMAL HIGH (ref 70–99)

## 2022-07-02 LAB — CULTURE, BLOOD (ROUTINE X 2)

## 2022-07-02 MED ORDER — INSULIN ASPART 100 UNIT/ML IJ SOLN
5.0000 [IU] | Freq: Three times a day (TID) | INTRAMUSCULAR | Status: DC
  Administered 2022-07-02 – 2022-07-04 (×5): 5 [IU] via SUBCUTANEOUS

## 2022-07-02 MED ORDER — INSULIN GLARGINE-YFGN 100 UNIT/ML ~~LOC~~ SOLN
15.0000 [IU] | Freq: Two times a day (BID) | SUBCUTANEOUS | Status: DC
  Administered 2022-07-02 – 2022-07-05 (×7): 15 [IU] via SUBCUTANEOUS
  Filled 2022-07-02 (×8): qty 0.15

## 2022-07-02 NOTE — Plan of Care (Signed)
  Problem: Activity: Goal: Ability to tolerate increased activity will improve Outcome: Progressing   Problem: Education: Goal: Knowledge of General Education information will improve Description: Including pain rating scale, medication(s)/side effects and non-pharmacologic comfort measures Outcome: Progressing   Problem: Activity: Goal: Risk for activity intolerance will decrease Outcome: Progressing   Problem: Coping: Goal: Level of anxiety will decrease Outcome: Progressing   Problem: Pain Managment: Goal: General experience of comfort will improve Outcome: Progressing   Problem: Skin Integrity: Goal: Risk for impaired skin integrity will decrease Outcome: Progressing   Problem: Safety: Goal: Ability to remain free from injury will improve Outcome: Progressing

## 2022-07-02 NOTE — Progress Notes (Signed)
PROGRESS NOTE Deborah [name]  NFA:[id number] DOB: [birth year] DOA: 06/18/2022 PCP: Elsie Amis, MD  Brief Narrative/Hospital Course: 57 y.o.f w/ medical history significant for insulin-dependent diabetes mellitus, hypertension, PAF on Eliquis, CKD stage IV, iron deficiency anemia, chronic pain, duodenal carcinoid tumor resected in 2021 and recent admission 05/28/22 -06/04/22 with diagnosis of acute metabolic encephalopathy due to dehydration with associated AKI on CKDIV, with course complicated by disseminated gout and discharged on colchine, Uloric  and allopurinol as well as steroid taper to SNF presented with worsening shortness of breath, fluid overload past 24 hours In the IO:NGEXBM has revealed multiple opacities on chest x-ray, COVID-19 is negative and respiratory viral panel is positive for metapneumovirus infection.  Placed on empiric antibiotics, seen by nephrology for fluid overload and placed on high-dose Lasix without improvement. Subsequently transferred to [facility] 4/18 for dialysis and access Underwent right IJ tunneled HD catheter placement by IR 4/18 S/p Left  S/P creation of left brachiocephalic fistula 8/41  HB 4/25 AT 6.2G: Received 1 unit PRBC with improvement   Subjective: Patient seen and examined this morning.   Reports generalized weakness but able to move all her extremities  She reports she does not feel ready to go home as she is still weak,refuses to go to snf this time Doing well on room air Labs this morning with mild hypokalemia hemoglobin up at 7.4 blood glucose 300s  Assessment and Plan: Principal Problem:   HCAP (healthcare-associated pneumonia)  Metapneumovirus multifocal pneumonia right more than left Fever and leukocytosis: Initially received vancomycin and cefepime,blood culture NGTD>transitioned to doxycycline and completed course. Currently doing well on room air, does have fluctuating leukocytosis intermittent fever, but afebrile  since 4/24 at 9 AM, blood culture from 4/22 NGTD Recent Labs  Lab 06/28/22 0255 06/29/22 0401 06/30/22 0501 07/01/22 1107 07/02/22 0227  WBC 14.0* 14.8* 16.4* 17.0* 16.1*    Acute on chronic kidney disease stage IV Bilateral lower extremity edema: B/L creat 2.1-3.1 from March 2023>4.8 creatinine on admission.  AKI in the setting of metapneumovirus respiratory infection and with fluid overload.UA unremarkable CT abdomen without obstruction, placed on high-dose Lasix without improvement in leg edema despite good urine output >subsequently s/p tunneled HD placement and dialysis initiated first HD 4/19.  >Continue HD per nephrology w/ [facility] >S/P creation of left brachiocephalic fistula 3/24 doing well post op Outpatient dialysis is set up, continue HD per schedule and avoid nephrotoxic medication  Hypokalemia:adjust HD per nephro Metabolic acidosis in the setting of CKD and AKI monitor.  Hypertension Chronic CHF with preserved EF: BP is controlled.  Monitor weight, fluid balance as below.Continue HD to address fluid overload. Continue Coreg for BP Net IO Since Admission: -11,600.04 mL [07/02/22 1128]  Filed Weights   06/26/22 2002 06/28/22 0811 06/28/22 1203  Weight: (!) 143.8 kg (!) 146.4 kg (!) 144.1 kg    Atrial fibrillation rate controlled on Coreg,back on eliquis 4/35 she refused heparin   Anemia multifactorial from IDA and CKD Acute blood loss anemia: Hemoglobin level fluctuating subpart 3 units PRBC, suspect ABLA w/ recent AV fistula creation versus anemia from chronic renal disease with acute illness continue iron supplement folate transfuse as needed or if less than 7 g  Recent Labs  Lab 06/29/22 0401 06/30/22 0501 06/30/22 1444 07/01/22 1107 07/02/22 0227  HGB 7.8* 7.3* 7.2* 6.3* 7.4*  HCT 25.3* 22.2* 21.9* 19.8* 23.0*    Hyperlipidemia continue statin  Chronic pain on abdomen Lumbar stenosis On chronic opiates: CT renal study without  obvious acute finding, has  diverticulosis.  LFTs are stable.  Lipase mildly elevated but in the setting of CKD/AKI. On Dilaudid mg every 8 hours prn at Home, reports she as been doubling the dose if she has worsening pain at home She requests IV dilaudid soemtime here. We discussed about minimizing opiates for chronic pain   Hyperuricemia/gout with recent flare up and disseminated into hyperuricemia and encephalopathy: Uric acid now down to 8, was 16.5 at previous admission.Continue colchicine,uloric.completed prednisone taper previously.   Type 2 diabetes mellitus history of HHS: HbA1c poorly controlled 8.1 blood sugar remains poorly controlled due to her noncompliance she refused to be on diabetic die. PTA on Premeal NovoLog 5 u 3 times daily and Semglee 20 units twice daily, increase Semglee to 15 units twice daily and Premeal to 5 units, keep on SSI.  Continue to encourage dietary restriction. Recent Labs  Lab 06/30/22 2138 07/01/22 0736 07/01/22 1120 07/01/22 1604 07/02/22 0734  GLUCAP 328* 366* 356* 266* 186*   Noncompliant with treatment/diets: She has been requested to remove IV line and iv stick, and  heparin had to be  discontinued, also requesting regular diet while her sugar remains uncontrolled. Patient was belligerent verbally aggressive 4/25 due to her diet being on renal carb modified diet  Morbid Obesity:Patient's Body mass index is 51.28 kg/m. : Will benefit with PCP follow-up, weight loss  healthy lifestyle and outpatient sleep evaluation.  DVT prophylaxis: Eliquis Code Status:   Code Status: Full Code Family Communication: plan of care discussed with patient at bedside. Patient status is: Inpatient because of need for dialysis Level of care: Telemetry Medical   Dispo: The patient is from: SNF            Anticipated disposition: home w/ HH. Refusing SNF  Objective: Vitals last 24 hrs: Vitals:   07/01/22 1459 07/01/22 1626 07/01/22 1700 07/02/22 0900  BP: (!) 110/55 112/64 114/71 133/68   Pulse: 87 88 87 88  Resp: 18 18 18    Temp: (!) 97.5 F (36.4 C) 97.7 F (36.5 C) 97.7 F (36.5 C) 98.2 F (36.8 C)  TempSrc: Oral Oral Oral Oral  SpO2: 97% 99% 99% 98%  Weight:      Height:       Weight change:   Physical Examination: General exam: alert awake, oriented, obese, older than stated age HEENT:Oral mucosa moist, Ear/Nose WNL grossly Respiratory system: Bilaterally clear BS, no use of accessory muscle Cardiovascular system: S1 & S2 +, No JVD. Gastrointestinal system: Abdomen soft,NT,ND, BS+ Nervous System: Alert, awake, moving extremities, shefollows commands. Extremities: LE edema + chronic appearing,distal peripheral pulses palpable.  Skin: No rashes,no icterus. MSK: Normal muscle bulk,tone, power TDC CATHETER+  Medications reviewed: Scheduled Meds:  allopurinol  50 mg Oral Once per day on Sun Wed   apixaban  5 mg Oral BID   atorvastatin  10 mg Oral Daily   carvedilol  12.5 mg Oral BID WC   Chlorhexidine Gluconate Cloth  6 each Topical Daily   Chlorhexidine Gluconate Cloth  6 each Topical Q0600   Chlorhexidine Gluconate Cloth  6 each Topical Q0600   darbepoetin (ARANESP) injection - NON-DIALYSIS  40 mcg Subcutaneous Q Mon-1800   donepezil  5 mg Oral QHS   febuxostat  40 mg Oral Daily   ferrous sulfate  325 mg Oral BID WC   folic acid  1 mg Oral Daily   guaiFENesin  600 mg Oral BID   insulin aspart  0-5 Units Subcutaneous QHS  insulin aspart  0-6 Units Subcutaneous TID WC   insulin aspart  4 Units Subcutaneous TID WC   insulin glargine-yfgn  8 Units Subcutaneous BID   mirtazapine  30 mg Oral QHS   pantoprazole  40 mg Oral BID  Continuous Infusions:    Diet Order             Diet regular Room service appropriate? Yes; Fluid consistency: Thin  Diet effective now                  Intake/Output Summary (Last 24 hours) at 07/02/2022 1128 Last data filed at 07/02/2022 0149 Gross per 24 hour  Intake 840 ml  Output 800 ml  Net 40 ml   Net IO  Since Admission: -11,600.04 mL [07/02/22 1128]  Wt Readings from Last 3 Encounters:  06/28/22 (!) 144.1 kg  06/04/22 130.5 kg  05/13/22 (!) 139.2 kg     Unresulted Labs (From admission, onward)     Start     Ordered   07/02/22 0500  Basic metabolic panel  Daily,   R     Question:  Specimen collection method  Answer:  Lab=Lab collect   07/01/22 0920   07/02/22 0500  CBC  Daily,   R     Question:  Specimen collection method  Answer:  Lab=Lab collect   07/01/22 0920          Data Reviewed: I have personally reviewed following labs and imaging studies CBC: Recent Labs  Lab 06/28/22 0255 06/29/22 0401 06/30/22 0501 06/30/22 1444 07/01/22 1107 07/02/22 0227  WBC 14.0* 14.8* 16.4*  --  17.0* 16.1*  HGB 7.7* 7.8* 7.3* 7.2* 6.3* 7.4*  HCT 23.9* 25.3* 22.2* 21.9* 19.8* 23.0*  MCV 85.7 88.5 86.0  --  86.5 87.8  PLT 236 313 296  --  317 338   Basic Metabolic Panel: Recent Labs  Lab 06/27/22 0225 06/28/22 0255 06/30/22 0501 07/01/22 1107 07/02/22 0227  NA 134* 131* 126* 127* 134*  K 3.2* 3.3* 3.7 3.8 3.4*  CL 97* 96* 94* 93* 96*  CO2 24 22 20* 22 23  GLUCOSE 202* 213* 136* 412* 335*  BUN 36* 43* 33* 37* 42*  CREATININE 3.85* 4.80* 4.90* 4.35* 4.85*  CALCIUM 8.5* 8.6* 8.0* 7.9* 8.3*  PHOS  --   --  5.2* 5.3*  --    GFR: Estimated Creatinine Clearance: 18.6 mL/min (A) (by C-G formula based on SCr of 4.85 mg/dL (H)). Liver Function Tests: Recent Labs  Lab 06/30/22 0501 07/01/22 1107  ALBUMIN 2.0* 1.8*   No results for input(s): "LIPASE", "AMYLASE" in the last 168 hours. CBG: Recent Labs  Lab 06/30/22 2138 07/01/22 0736 07/01/22 1120 07/01/22 1604 07/02/22 0734  GLUCAP 328* 366* 356* 266* 186*   No results for input(s): "PROCALCITON", "LATICACIDVEN" in the last 168 hours.   Recent Results (from the past 240 hour(s))  Culture, blood (Routine X 2) w Reflex to ID Panel     Status: None (Preliminary result)   Collection Time: 06/28/22  7:01 AM   Specimen:  BLOOD RIGHT ARM  Result Value Ref Range Status   Specimen Description BLOOD RIGHT ARM  Final   Special Requests   Final    BOTTLES DRAWN AEROBIC AND ANAEROBIC Blood Culture adequate volume   Culture   Final    NO GROWTH 4 DAYS Performed at [facility], [address] [place], [place] [address]    Report Status PENDING  Incomplete  Culture,  blood (Routine X 2) w Reflex to ID Panel     Status: None (Preliminary result)   Collection Time: 06/28/22  7:03 AM   Specimen: BLOOD  Result Value Ref Range Status   Specimen Description BLOOD RIGHT ANTECUBITAL  Final   Special Requests   Final    BOTTLES DRAWN AEROBIC ONLY Blood Culture results may not be optimal due to an inadequate volume of blood received in culture bottles   Culture   Final    NO GROWTH 4 DAYS Performed at [facility], [address] [place], [place] [address]    Report Status PENDING  Incomplete    Antimicrobials: Anti-infectives (From admission, onward)    Start     Dose/Rate Route Frequency Ordered Stop   06/30/22 1030  vancomycin (VANCOCIN) IVPB 1000 mg/200 mL premix  Status:  Discontinued        1,000 mg 200 mL/hr over 60 Minutes Intravenous  Once 06/30/22 1021 06/30/22 1025   06/30/22 1030  vancomycin (VANCOREADY) IVPB 1500 mg/300 mL        1,500 mg 150 mL/hr over 120 Minutes Intravenous  Once 06/30/22 1026 06/30/22 1259   06/30/22 1022  vancomycin (VANCOCIN) 1-5 GM/200ML-% IVPB       Note to Pharmacy: Susy [place] L: cabinet override      06/30/22 1022 06/30/22 2229   06/28/22 0600  vancomycin (VANCOCIN) IVPB 1000 mg/200 mL premix  Status:  Discontinued        1,000 mg 200 mL/hr over 60 Minutes Intravenous On call to O.R. 06/27/22 1002 06/27/22 1023   06/24/22 1615  vancomycin (VANCOCIN) IVPB 1000 mg/200 mL premix        1,000 mg 200 mL/hr over 60 Minutes Intravenous  Once 06/24/22 1516 06/24/22 1729   06/21/22 1100  doxycycline (VIBRA-TABS) tablet 100 mg        100 mg Oral Every 12 hours  06/21/22 1002 06/26/22 0959   06/19/22 2300  ceFEPIme (MAXIPIME) 2 g in sodium chloride 0.9 % 100 mL IVPB  Status:  Discontinued        2 g 200 mL/hr over 30 Minutes Intravenous Every 24 hours 06/19/22 0156 06/21/22 1002   06/19/22 0200  vancomycin (VANCOREADY) IVPB 1500 mg/300 mL        1,500 mg 150 mL/hr over 120 Minutes Intravenous  Once 06/19/22 0156 06/19/22 0622   06/19/22 0157  vancomycin variable dose per unstable renal function (pharmacist dosing)  Status:  Discontinued         Does not apply See admin instructions 06/19/22 0157 06/20/22 1123   06/18/22 2330  ceFEPIme (MAXIPIME) 2 g in sodium chloride 0.9 % 100 mL IVPB        2 g 200 mL/hr over 30 Minutes Intravenous  Once 06/18/22 2317 06/19/22 0007   06/18/22 2315  vancomycin (VANCOCIN) IVPB 1000 mg/200 mL premix        1,000 mg 200 mL/hr over 60 Minutes Intravenous  Once 06/18/22 2306 06/19/22 0203      Culture/Microbiology    Component Value Date/Time   SDES BLOOD RIGHT ANTECUBITAL 06/28/2022 0703   SPECREQUEST  06/28/2022 0703    BOTTLES DRAWN AEROBIC ONLY Blood Culture results may not be optimal due to an inadequate volume of blood received in culture bottles   CULT  06/28/2022 0703    NO GROWTH 4 DAYS Performed at [facility], [address], [place], [place] [address]    REPTSTATUS PENDING 06/28/2022 [phone]  Other culture-see note  Radiology Studies: VAS [place] UPPER EXTREMITY VENOUS DUPLEX  Result Date: 07/01/2022 UPPER VENOUS STUDY  Patient Name:  FATEMAH POURCIAU  Date of Exam:   07/01/2022 Medical Rec #: [id number]             Accession #:    [id number] Date of Birth: [birth year]             Patient Gender: F Patient Age:   26 years Exam Location:  [facility] Procedure:      VAS [place] UPPER EXTREMITY VENOUS DUPLEX Referring Phys: Malakie Balis --------------------------------------------------------------------------------  Indications: Right edema Other Indications: Right subclavian tunneled hemodialysis  catheter. Comparison Study: No prior studies. Performing Technologist: Jean Rosenthal RDMS, RVT  Examination Guidelines: A complete evaluation includes B-mode imaging, spectral Doppler, color Doppler, and power Doppler as needed of all accessible portions of each vessel. Bilateral testing is considered an integral part of a complete examination. Limited examinations for reoccurring indications may be performed as noted.  Right Findings: +----------+------------+---------+-----------+----------+-------+ RIGHT     CompressiblePhasicitySpontaneousPropertiesSummary +----------+------------+---------+-----------+----------+-------+ IJV           Full       Yes       Yes                      +----------+------------+---------+-----------+----------+-------+ Subclavian               Yes       Yes                      +----------+------------+---------+-----------+----------+-------+ Axillary      Full       Yes       Yes                      +----------+------------+---------+-----------+----------+-------+ Brachial      Full                                          +----------+------------+---------+-----------+----------+-------+ Radial        Full                                          +----------+------------+---------+-----------+----------+-------+ Ulnar         Full                                          +----------+------------+---------+-----------+----------+-------+ Cephalic      Full                                          +----------+------------+---------+-----------+----------+-------+ Basilic       Full                                          +----------+------------+---------+-----------+----------+-------+  Left Findings: +----------+------------+---------+-----------+----------+---------------------+ LEFT      CompressiblePhasicitySpontaneousProperties       Summary         +----------+------------+---------+-----------+----------+---------------------+ Subclavian  Unable to evaluate-                                                       recent AVF, patient                                                        declining imaging                                                               left          +----------+------------+---------+-----------+----------+---------------------+  Summary:  Right: No evidence of deep vein thrombosis in the upper extremity. No evidence of superficial vein thrombosis in the upper extremity.  *See table(s) above for measurements and observations.     Preliminary     LOS: 13 days  Lanae Boast, MD [facility]  07/02/2022, 11:28 AM

## 2022-07-02 NOTE — Progress Notes (Signed)
Admit: 06/18/2022 LOS: 13  36F AoCKD4, multifocal pneumonia likley 2/2 metapneumovirus, recent admission for disseminated gout with encephalopathy and AKI, just discharged from SNF.   Subjective:   She had 800 mL UOP over 4/25.  Last HD on 4/24 with 2 kg UF.  She hasn't yet come for HD.  She states that she has discussed left arm weakness with her primary team and that she noted it yesterday.     Review of systems:    Denies n/v Denies shortness of breath Denies chest pain  Her right arm feels weaker after surgery and swollen; left arm feels weaker and swollen and states this started yesterday She is on the bedside commode - nursing has checked on her regularly    Physical Exam:  Blood pressure 122/62, pulse 90, temperature 98.7 F (37.1 C), resp. rate 17, height 5\' 6"  (1.676 m), weight (!) 144.1 kg, last menstrual period 10/10/2012, SpO2 100 %. General adult female in bed in no acute distress    HEENT normocephalic atraumatic extraocular movements intact sclera anicteric Neck supple trachea midline Lungs clear to auscultation bilaterally normal work of breathing at rest on room air Heart S1S2 no rub Abdomen soft nontender obese habitus  Extremities diffuse 2+ edema lower extremities  Psych normal mood and affect Neuro - alert and oriented x 3 provides history and follows commands Access RIJ tunn catheter; LUE AVF with bruit and thrill      Date   Creat  eGFR  2018-2020  1.01- 1.39   2021   1.31- 2.70  2022   1.64- 4.38  2023   1.72- 6.25 Mar 2022  3.02- 5.93 8- 17 ml/min  March 2024  1.92- 4.59 10-30 ml/min  4/12   4.84  10 ml/min    4/13   4.82  4/14   4.83  06/20/21  4.99  9 ml/min     B/l creat 2.1- 3.1 from march 2023, eGFR 17- 28 ml/ min   UA 11-20 wbc, 5-10 rbc from 06/19/22, prot 30   CT abd renal --> normal appearing kidneys w/o obstruction    Assessment/ Plan AoCKD4 with progression to ESRD Recently with variable Creatinine 2.7 - 4.  Creat later here 4.8 on  admission in setting of MPV resp infection and significant lower ext edema. Followed by Dr Thedore Mins at [facility]. UA unremarkable, CT abd w/o obstruction.  Per charting, Lasix started at 40 mg bid IV then titrated up to 120mg  IV tid but no sig improvement in wt's or edema. Pt's brother (esrd on HD) called and discussed with team that her QOL is "horrible" and has been for the last couple of years due to her chronic fluid overload issues and he doesn't see her getting better. He felt she would do better on dialysis. Nephrology d/w the patient and she acknowledged very difficult times lately due to chronic fluid overload refractory to diuretics.  Tunneled catheter placed by IR and pt had her 1st HD Friday 4/19.  She had a left brachiocephalic AVF placed on 4/24 with Dr. Chestine Spore   HD per MWF schedule.  She is being done by evening shift due to staffing/volumes  She has been accepted to [facility] on MWF schedule    I have ordered daily weights  BMP in AM  Metapneumovirus - admit diagnosis, w/ multifocal pneumonia R > L, sp IV vanc + cefepime, BCx NGTD. s/p po doxy.  Fluid volume overload - improving w/ dialysis Hypertension - controlled; optimize volume with  HD.  If BP drops may need to lower the coreg however note that this is also directed at her afib.   Gout - with severe hyperuricemia; per primary team  Anemia of CKD -   transfuse per primary team. S/p PRBC's on 4/25.  Started aranesp 40 mcg every Monday.  Iron is ok.  HFpEF - optimize volume with HD  DM2 with history of HHS.  Per primary team  Atrial fibrillation - per primary team.  Note that her only BP agent is also directed at her afib   Disposition - disposition per primary team    Recent Labs  Lab 06/30/22 0501 06/30/22 1444 07/01/22 1107 07/02/22 0227  HGB 7.3*   < > 6.3* 7.4*  ALBUMIN 2.0*  --  1.8*  --   CALCIUM 8.0*  --  7.9* 8.3*  PHOS 5.2*  --  5.3*  --   CREATININE 4.90*  --  4.35* 4.85*  K 3.7  --  3.8 3.4*   < > = values in  this interval not displayed.    Inpatient medications:  allopurinol  50 mg Oral Once per day on Sun Wed   apixaban  5 mg Oral BID   atorvastatin  10 mg Oral Daily   carvedilol  12.5 mg Oral BID WC   Chlorhexidine Gluconate Cloth  6 each Topical Daily   Chlorhexidine Gluconate Cloth  6 each Topical Q0600   Chlorhexidine Gluconate Cloth  6 each Topical Q0600   darbepoetin (ARANESP) injection - NON-DIALYSIS  40 mcg Subcutaneous Q Mon-1800   donepezil  5 mg Oral QHS   febuxostat  40 mg Oral Daily   ferrous sulfate  325 mg Oral BID WC   folic acid  1 mg Oral Daily   guaiFENesin  600 mg Oral BID   insulin aspart  0-5 Units Subcutaneous QHS   insulin aspart  0-6 Units Subcutaneous TID WC   insulin aspart  5 Units Subcutaneous TID WC   insulin glargine-yfgn  15 Units Subcutaneous BID   mirtazapine  30 mg Oral QHS   pantoprazole  40 mg Oral BID      albuterol, guaiFENesin, HYDROmorphone, loperamide, ondansetron **OR** ondansetron (ZOFRAN) IV, mouth rinse    Estanislado Emms, MD 7:06 PM 07/02/2022

## 2022-07-02 NOTE — Progress Notes (Signed)
Received consult for IV. Pt has 1 prn IV medication listed on her MAR; she has no documentation of receiving a dose. Secure chat sent to RN regarding holding on IV for now. RN agrees to this. VAS Team available for any future needs.

## 2022-07-02 NOTE — Progress Notes (Signed)
Pt refused overnight vitals.

## 2022-07-02 NOTE — Progress Notes (Signed)
Pt for possible d/c this weekend if pt mobilizes well per attending. Pt can start at out-pt HD clinic on Monday if pt is d/c this weekend. Contacted renal PA regarding clinic's need for orders at d/c. Also contacted clinic to make them aware that pt may d/c this weekend and start on Monday. HD arrangements placed on pt's AVS.   Olivia Canter Renal Navigator [phone]

## 2022-07-03 ENCOUNTER — Encounter (HOSPITAL_COMMUNITY): Payer: 59

## 2022-07-03 DIAGNOSIS — J189 Pneumonia, unspecified organism: Secondary | ICD-10-CM | POA: Diagnosis not present

## 2022-07-03 LAB — TYPE AND SCREEN: Antibody Screen: NEGATIVE

## 2022-07-03 LAB — BPAM RBC
Blood Product Expiration Date: 202406012359
Blood Product Expiration Date: 202406012359
ISSUE DATE / TIME: 202404251429
Unit Type and Rh: 5100

## 2022-07-03 LAB — CBC
HCT: 24.2 % — ABNORMAL LOW (ref 36.0–46.0)
Hemoglobin: 7.4 g/dL — ABNORMAL LOW (ref 12.0–15.0)
MCH: 27.3 pg (ref 26.0–34.0)
MCHC: 30.6 g/dL (ref 30.0–36.0)
MCV: 89.3 fL (ref 80.0–100.0)
Platelets: 366 10*3/uL (ref 150–400)
RBC: 2.71 MIL/uL — ABNORMAL LOW (ref 3.87–5.11)
RDW: 19 % — ABNORMAL HIGH (ref 11.5–15.5)
WBC: 14.4 10*3/uL — ABNORMAL HIGH (ref 4.0–10.5)
nRBC: 0 % (ref 0.0–0.2)

## 2022-07-03 LAB — BASIC METABOLIC PANEL
Anion gap: 10 (ref 5–15)
BUN: 23 mg/dL — ABNORMAL HIGH (ref 6–20)
CO2: 26 mmol/L (ref 22–32)
Calcium: 8 mg/dL — ABNORMAL LOW (ref 8.9–10.3)
Chloride: 98 mmol/L (ref 98–111)
Creatinine, Ser: 3.18 mg/dL — ABNORMAL HIGH (ref 0.44–1.00)
GFR, Estimated: 16 mL/min — ABNORMAL LOW (ref >60)
Glucose, Bld: 122 mg/dL — ABNORMAL HIGH (ref 70–99)
Potassium: 3.3 mmol/L — ABNORMAL LOW (ref 3.5–5.1)
Sodium: 134 mmol/L — ABNORMAL LOW (ref 135–145)

## 2022-07-03 LAB — GLUCOSE, CAPILLARY
Glucose-Capillary: 111 mg/dL — ABNORMAL HIGH (ref 70–99)
Glucose-Capillary: 87 mg/dL (ref 70–99)
Glucose-Capillary: 102 mg/dL — ABNORMAL HIGH (ref 70–99)
Glucose-Capillary: 224 mg/dL — ABNORMAL HIGH (ref 70–99)

## 2022-07-03 MED ORDER — POTASSIUM CHLORIDE CRYS ER 20 MEQ PO TBCR: 20.0000 meq | EXTENDED_RELEASE_TABLET | Freq: Once | ORAL | Status: DC

## 2022-07-03 MED ORDER — MIRTAZAPINE 15 MG PO TABS
15.0000 mg | ORAL_TABLET | Freq: Every day | ORAL | Status: DC
  Administered 2022-07-03 – 2022-07-05 (×3): 15 mg via ORAL
  Filled 2022-07-03 (×3): qty 1

## 2022-07-03 MED ORDER — HEPARIN SODIUM (PORCINE) 1000 UNIT/ML DIALYSIS
3000.0000 [IU] | INTRAMUSCULAR | Status: DC | PRN
  Filled 2022-07-03: qty 3

## 2022-07-03 MED ORDER — FUROSEMIDE 10 MG/ML IJ SOLN: 80.0000 mg | Freq: Once | INTRAMUSCULAR | Status: DC

## 2022-07-03 MED ORDER — HEPARIN SODIUM (PORCINE) 1000 UNIT/ML DIALYSIS
3000.0000 [IU] | Freq: Once | INTRAMUSCULAR | Status: DC
  Filled 2022-07-03: qty 3

## 2022-07-03 MED ORDER — DARBEPOETIN ALFA 100 MCG/0.5ML IJ SOSY
100.0000 ug | PREFILLED_SYRINGE | INTRAMUSCULAR | Status: DC
  Administered 2022-07-05: 100 ug via SUBCUTANEOUS
  Filled 2022-07-03: qty 0.5

## 2022-07-03 MED ORDER — FUROSEMIDE 40 MG PO TABS
80.0000 mg | ORAL_TABLET | Freq: Every day | ORAL | Status: DC
  Administered 2022-07-03 – 2022-07-05 (×3): 80 mg via ORAL
  Filled 2022-07-03 (×3): qty 2

## 2022-07-03 MED ORDER — HEPARIN SODIUM (PORCINE) 1000 UNIT/ML IJ SOLN
INTRAMUSCULAR | Status: AC
  Administered 2022-07-03: 1000 [IU] via INTRAVENOUS_CENTRAL
  Filled 2022-07-03: qty 4

## 2022-07-03 MED ORDER — POTASSIUM CHLORIDE CRYS ER 20 MEQ PO TBCR
40.0000 meq | EXTENDED_RELEASE_TABLET | Freq: Once | ORAL | Status: AC
  Administered 2022-07-03: 40 meq via ORAL
  Filled 2022-07-03: qty 2

## 2022-07-03 NOTE — Progress Notes (Signed)
PROGRESS NOTE   Deborah [name]  FAO:[id number] DOB: [birth year] DOA: 06/18/2022 PCP: Elsie Amis, MD  Brief Narrative:  57 year old black female  Atrial fibrillation CHADVASC >4/Eliquis CKD 4 Duodenal carcinoid resected 2021 Lumbar spinal stenosis previously wheelchair-bound-not an operative candidate per Dr. 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-has been transfused previously  Presented from skilled facility with shortness of breath fluid overload found to have multiple opacities on CXR COVID-19 negative + METAPNEUMOVIRUS positive Nephrology consulted for fluid overload  4/18 transferred to [facility] dialysis access 4/18 right IJ tunneled HD cath 4/24 creation left brachiocephalic fistula 6/96 1 unit PRBC given-a total of 3 4/27 right upper extremity duplex ordered  Hospital-Problem based course  Metapneumovirus infection with superimposed multifocal pneumonia Antibiotics completed on 4/24 and symptoms have improved, because of continued leukocytosis is unclear only monitor periodically  Atrial fibrillation CHADVASC >4 HFpEF EF 50-55% 09/11/2021 Management now with HD for dialysis in addition to p.o. Lasix 80 daily (lost IV) Continue Coreg 12.5 twice daily Adjust antihypertensives as EDW is childish  New ESRD and metabolic acidosis dialyzing via right IJ tunneled HD cath Maturing left brachiocephalic fistula--- is clipped for outpatient HD clinic on Monday Check renal panel in a.m.--acidosis seems to have resolved with dialysis Would be very cautious with potassium replacement given ESRD Foley removal 4/27  Normocytic anemia likely anemia renal disease Iron studies normal saturation ratio is okay-defer ESA to renal Continue oral iron 325 twice daily meals watch for constipation  Permanent A-fib CHADVASC >4 Continue Coreg 12.5 as above Continue  Eliquis 5 twice daily  Painful and swollen right upper extremity Last hospitalization 05/2022 was felt to have disseminated gout[?] I will stop allopurinol and we will get a uric acid in the morning but should only keep on Uloric-she was discharged with tapering doses of steroids at that time Ultrasound upper extremity right side 4/25 was negative for DVT  Chronic pain Lumbar stenosis Wheelchair-bound at home Continues on Dilaudid 4 every 6 as needed pain  ?  Dementia Placed on Aricept on 4/13 which I will discontinue Will discontinue or lowered dose of Remeron to 15 at bedtime  BMI 51 Not sure if she has good insight-she is on "liquid diet" today for no specific reason   DVT prophylaxis: Apixaban Code Status: Full Family Communication: None present Disposition:  Status is: Inpatient Remains inpatient appropriate because:   Requires further management inpatient and stability and likely discharge on 4/29    Subjective: Coherent pleasant no distress doing fair No fever no chills Tells me she absolutely does not want to go to rehab and wants to go home She seems to have reasonable insight that she will need to mobilize some-she is getting up in the room to some degree small distances per nursing  Objective: Vitals:   07/03/22 0340 07/03/22 0400 07/03/22 0410 07/03/22 0546  BP: (!) 111/46  (!) 108/57 (!) 126/55  Pulse: 98 97 97 98  Resp: (!) 24 (!) 28 (!) 23 14  Temp:   99.1 F (37.3 C) 99.1 F (37.3 C)  TempSrc:    Oral  SpO2: 97% 96% 97% 97%  Weight:    (!) 148.2 kg  Height:        Intake/Output Summary (Last 24 hours) at 07/03/2022 0846 Last data filed at 07/03/2022 0600 Gross per 24 hour  Intake 650 ml  Output 2950 ml  Net -2300  ml   Filed Weights   06/28/22 0811 06/28/22 1203 07/03/22 0546  Weight: (!) 146.4 kg (!) 144.1 kg (!) 148.2 kg    Examination:  EOMI NCAT no focal deficit thick neck Mallampati 4 Chest is clear TDC and placed left sided fistula  in process of healing Right arm is quite swollen in the forearm region and tender and warm Abdomen is obese poor exam Both lower extremities have grade 1 edema with significant adiposity Neurologically she is coherent and intact  Data Reviewed: personally reviewed   CBC    Component Value Date/Time   WBC 14.4 (H) 07/03/2022 0652   RBC 2.71 (L) 07/03/2022 0652   HGB 7.4 (L) 07/03/2022 0652   HGB 8.8 (L) 10/09/2019 0906   HGB 9.6 (L) 09/03/2019 1620   HCT 24.2 (L) 07/03/2022 0652   HCT 30.4 (L) 09/03/2019 1620   PLT 366 07/03/2022 0652   PLT 348 10/09/2019 0906   PLT 451 (H) 09/03/2019 1620   MCV 89.3 07/03/2022 0652   MCV 85 09/03/2019 1620   MCH 27.3 07/03/2022 0652   MCHC 30.6 07/03/2022 0652   RDW 19.0 (H) 07/03/2022 0652   RDW 13.6 09/03/2019 1620   LYMPHSABS 2.0 06/03/2022 0432   LYMPHSABS 2.2 09/03/2019 1620   MONOABS 1.0 06/03/2022 0432   EOSABS 0.0 06/03/2022 0432   EOSABS 0.3 09/03/2019 1620   BASOSABS 0.0 06/03/2022 0432   BASOSABS 0.1 09/03/2019 1620      Latest Ref Rng & Units 07/03/2022    6:52 AM 07/02/2022    2:27 AM 07/01/2022   11:07 AM  CMP  Glucose 70 - 99 mg/dL 161  096  045   BUN 6 - 20 mg/dL 23  42  37   Creatinine 0.44 - 1.00 mg/dL 4.09  8.11  9.14   Sodium 135 - 145 mmol/L 134  134  127   Potassium 3.5 - 5.1 mmol/L 3.3  3.4  3.8   Chloride 98 - 111 mmol/L 98  96  93   CO2 22 - 32 mmol/L 26  23  22    Calcium 8.9 - 10.3 mg/dL 8.0  8.3  7.9      Radiology Studies: VAS [place] UPPER EXTREMITY VENOUS DUPLEX  Result Date: 07/02/2022 UPPER VENOUS STUDY  Patient Name:  Deborah [name]  Date of Exam:   07/01/2022 Medical Rec #: [id number]             Accession #:    [id number] Date of Birth: [birth year]             Patient Gender: F Patient Age:   63 years Exam Location:  [facility] Procedure:      VAS [place] UPPER EXTREMITY VENOUS DUPLEX Referring Phys: RAMESH KC --------------------------------------------------------------------------------   Indications: Right edema Other Indications: Right subclavian tunneled hemodialysis catheter. Comparison Study: No prior studies. Performing Technologist: Jean Rosenthal RDMS, RVT  Examination Guidelines: A complete evaluation includes B-mode imaging, spectral Doppler, color Doppler, and power Doppler as needed of all accessible portions of each vessel. Bilateral testing is considered an integral part of a complete examination. Limited examinations for reoccurring indications may be performed as noted.  Right Findings: +----------+------------+---------+-----------+----------+-------+ RIGHT     CompressiblePhasicitySpontaneousPropertiesSummary +----------+------------+---------+-----------+----------+-------+ IJV           Full       Yes       Yes                      +----------+------------+---------+-----------+----------+-------+  Subclavian               Yes       Yes                      +----------+------------+---------+-----------+----------+-------+ Axillary      Full       Yes       Yes                      +----------+------------+---------+-----------+----------+-------+ Brachial      Full                                          +----------+------------+---------+-----------+----------+-------+ Radial        Full                                          +----------+------------+---------+-----------+----------+-------+ Ulnar         Full                                          +----------+------------+---------+-----------+----------+-------+ Cephalic      Full                                          +----------+------------+---------+-----------+----------+-------+ Basilic       Full                                          +----------+------------+---------+-----------+----------+-------+  Left Findings: +----------+------------+---------+-----------+----------+---------------------+ LEFT       CompressiblePhasicitySpontaneousProperties       Summary        +----------+------------+---------+-----------+----------+---------------------+ Subclavian                                           Unable to evaluate-                                                       recent AVF, patient                                                        declining imaging                                                               left          +----------+------------+---------+-----------+----------+---------------------+  Summary:  Right: No evidence of deep vein thrombosis in the upper extremity. No evidence of superficial vein thrombosis in the upper extremity.  *See table(s) above for measurements and observations.  Diagnosing physician: Gerarda Fraction Electronically signed by Gerarda Fraction on 07/02/2022 at 4:32:42 PM.    Final      Scheduled Meds:  apixaban  5 mg Oral BID   atorvastatin  10 mg Oral Daily   carvedilol  12.5 mg Oral BID WC   Chlorhexidine Gluconate Cloth  6 each Topical Daily   Chlorhexidine Gluconate Cloth  6 each Topical Q0600   Chlorhexidine Gluconate Cloth  6 each Topical Q0600   [START ON 07/05/2022] darbepoetin (ARANESP) injection - NON-DIALYSIS  100 mcg Subcutaneous Q Mon-1800   febuxostat  40 mg Oral Daily   ferrous sulfate  325 mg Oral BID WC   folic acid  1 mg Oral Daily   furosemide  80 mg Oral Daily   guaiFENesin  600 mg Oral BID   [START ON 07/04/2022] heparin  3,000 Units Dialysis Once in dialysis   insulin aspart  0-5 Units Subcutaneous QHS   insulin aspart  0-6 Units Subcutaneous TID WC   insulin aspart  5 Units Subcutaneous TID WC   insulin glargine-yfgn  15 Units Subcutaneous BID   mirtazapine  15 mg Oral QHS   pantoprazole  40 mg Oral BID   Continuous Infusions:   LOS: 14 days   Time spent: 89  Rhetta Mura, MD [facility] To contact the attending provider between 7A-7P or the covering provider during after  hours 7P-7A, please log into the web site [email or web address] and access using universal Effie password for that web site. If you do not have the password, please call the hospital operator.  07/03/2022, 8:46 AM

## 2022-07-03 NOTE — Progress Notes (Signed)
Admit: 06/18/2022 LOS: 14  54F AoCKD4, multifocal pneumonia likley 2/2 metapneumovirus, recent admission for disseminated gout with encephalopathy and AKI, just discharged from SNF.   Subjective:   She had 450 mL UOP over 4/26.  She had HD overnight with 2.5 kg UF.  Treatment ended around 4 am.  I spoke with primary team last night and communicated with them this AM about right arm weakness.  She had her IV taken out and feels better.  She doesn't want another IV.  She states that she is going home rather than rehab.  Per charting nursing consulted for another IV overnight and the VAS/IV team felt that she did not need one overnight.  She states her left arm is getting stronger - this is the arm with the AVF  Review of systems:    Denies n/v Denies shortness of breath Denies chest pain  Her right arm feels better after getting the IV out     Physical Exam:  Blood pressure (!) 122/58, pulse 99, temperature 98.8 F (37.1 C), temperature source Oral, resp. rate 16, height 5\' 6"  (1.676 m), weight (!) 148.2 kg, last menstrual period 10/10/2012, SpO2 98 %. General adult female in bed in no acute distress    HEENT normocephalic atraumatic extraocular movements intact sclera anicteric Neck supple trachea midline Lungs clear to auscultation bilaterally normal work of breathing at rest on room air Heart S1S2 no rub Abdomen soft nontender obese habitus  Extremities diffuse 2+ edema lower extremities  Psych normal mood and affect Neuro - alert and oriented x 3 provides history and follows commands Access RIJ tunn catheter; LUE AVF with bruit and thrill      Date   Creat  eGFR  2018-2020  1.01- 1.39   2021   1.31- 2.70  2022   1.64- 4.38  2023   1.72- 6.25 Mar 2022  3.02- 5.93 8- 17 ml/min  March 2024  1.92- 4.59 10-30 ml/min  4/12   4.84  10 ml/min    4/13   4.82  4/14   4.83  06/20/21  4.99  9 ml/min     B/l creat 2.1- 3.1 from march 2023, eGFR 17- 28 ml/ min   UA 11-20 wbc, 5-10 rbc  from 06/19/22, prot 30   CT abd renal --> normal appearing kidneys w/o obstruction    Assessment/ Plan AoCKD4 with progression to ESRD Recently with variable Creatinine 2.7 - 4.  Creat later here 4.8 on admission in setting of MPV resp infection and significant lower ext edema. Followed by Dr Thedore Mins at [facility]. UA unremarkable, CT abd w/o obstruction.  Per charting, Lasix started at 40 mg bid IV then titrated up to 120mg  IV tid but no sig improvement in wt's or edema. Pt's brother (esrd on HD) called and discussed with team that her QOL is "horrible" and has been for the last couple of years due to her chronic fluid overload issues and he doesn't see her getting better. He felt she would do better on dialysis. Nephrology d/w the patient and she acknowledged very difficult times lately due to chronic fluid overload refractory to diuretics.  Tunneled catheter placed by IR and pt had her 1st HD Friday 4/19.  She had a left brachiocephalic AVF placed on 4/24 with Dr. Chestine Spore   HD per MWF schedule  She has been accepted to [facility] on MWF schedule    I have ordered daily weights  I had recommended lasix IV today and tomorrow.  She states that she just had her IV out and doesn't want another one.  Will give lasix 80 mg daily for now then will need to transition to lasix on non-dialysis days on discharge  Vascular is setting up follow-up with [facility] for duplex in 4-6 weeks.  Per charting she will likely need superficialization  Metapneumovirus - admit diagnosis, w/ multifocal pneumonia R > L, sp IV vanc + cefepime, BCx NGTD. s/p po doxy.  Fluid volume overload - improving w/ dialysis Hypertension - controlled; optimize volume with HD.  If BP drops may need to lower the coreg however note that this is also directed at her afib.   Gout - with severe hyperuricemia; per primary team  Anemia of CKD -   transfuse per primary team. S/p PRBC's on 4/25.  Started aranesp 40 mcg every Monday - increase to aranesp 100 mcg  every Monday for next dose.  Iron is ok.  HFpEF - optimize volume with HD  Hypokalemia - replete with 40 meq potassium once today.   DM2 with history of HHS.  Per primary team  Atrial fibrillation - per primary team.  Note that her only BP agent is also directed at her afib   Disposition - disposition per primary team.  She has an AVF and outpatient unit.  Per charting she is refusing SNF.    Recent Labs  Lab 06/30/22 0501 06/30/22 1444 07/01/22 1107 07/02/22 0227 07/03/22 0652  HGB 7.3*   < > 6.3* 7.4* 7.4*  ALBUMIN 2.0*  --  1.8*  --   --   CALCIUM 8.0*  --  7.9* 8.3* 8.0*  PHOS 5.2*  --  5.3*  --   --   CREATININE 4.90*  --  4.35* 4.85* 3.18*  K 3.7  --  3.8 3.4* 3.3*   < > = values in this interval not displayed.    Inpatient medications:  allopurinol  50 mg Oral Once per day on Sun Wed   apixaban  5 mg Oral BID   atorvastatin  10 mg Oral Daily   carvedilol  12.5 mg Oral BID WC   Chlorhexidine Gluconate Cloth  6 each Topical Daily   Chlorhexidine Gluconate Cloth  6 each Topical Q0600   Chlorhexidine Gluconate Cloth  6 each Topical Q0600   darbepoetin (ARANESP) injection - NON-DIALYSIS  40 mcg Subcutaneous Q Mon-1800   donepezil  5 mg Oral QHS   febuxostat  40 mg Oral Daily   ferrous sulfate  325 mg Oral BID WC   folic acid  1 mg Oral Daily   guaiFENesin  600 mg Oral BID   [START ON 07/04/2022] heparin  3,000 Units Dialysis Once in dialysis   insulin aspart  0-5 Units Subcutaneous QHS   insulin aspart  0-6 Units Subcutaneous TID WC   insulin aspart  5 Units Subcutaneous TID WC   insulin glargine-yfgn  15 Units Subcutaneous BID   mirtazapine  30 mg Oral QHS   pantoprazole  40 mg Oral BID      albuterol, guaiFENesin, [START ON 07/04/2022] heparin, HYDROmorphone, loperamide, ondansetron **OR** ondansetron (ZOFRAN) IV, mouth rinse    Estanislado Emms, MD 1:53 PM 07/03/2022

## 2022-07-03 NOTE — Progress Notes (Signed)
Received patient in bed to unit.  Alert and oriented.  Informed consent signed and in chart.   TX duration: 3.15  Patient tolerated well.  Transported back to the room  Alert, without acute distress.  Hand-off given to patient's nurse.   Access used: catheter Access issues: none  Total UF removed: 2500 ml Medication(s) given: none Post HD VS: 108/57 Post HD weight: 138 kg     07/03/22 0410  Vitals  Temp 99.1 F (37.3 C)  BP (!) 108/57  MAP (mmHg) 72  BP Location Right Arm  BP Method Automatic  Patient Position (if appropriate) Lying  Pulse Rate 97  Pulse Rate Source Monitor  ECG Heart Rate 99  Resp (!) 23  Oxygen Therapy  SpO2 97 %  O2 Device Room Air  Post Treatment  Dialyzer Clearance Lightly streaked  Duration of HD Treatment -hour(s) 3.15 hour(s)  Hemodialysis Intake (mL) 0 mL  Liters Processed 58.5  Fluid Removed (mL) 2500 mL  Tolerated HD Treatment Yes  Post-Hemodialysis Comments HD tx completed as expected, tolerated well. has had a fever  AVG/AVF Arterial Site Held (minutes) 0 minutes  AVG/AVF Venous Site Held (minutes) 0 minutes  Note  Observations pt is in bed resting, tempo 99.1.  Hemodialysis Catheter Right Subclavian Double lumen Permanent (Tunneled)  Placement Date/Time: 06/24/22 1644   Serial / Lot #: [id number]  Expiration Date: 09/08/26  Time Out: Correct patient;Correct site;Correct procedure  Maximum sterile barrier precautions: Hand hygiene;Mask;Cap;Sterile gloves;Sterile gown;Large sterile ...  Site Condition No complications  Blue Lumen Status Flushed;Heparin locked;Dead end cap in place  Red Lumen Status Flushed;Heparin locked;Dead end cap in place  Catheter fill solution Heparin 1000 units/ml  Catheter fill volume (Arterial) 1.9 cc  Catheter fill volume (Venous) 1.9  Dressing Type Transparent  Dressing Status Antimicrobial disc in place  Drainage Description None  Post treatment catheter status Capped and Clamped

## 2022-07-04 DIAGNOSIS — J189 Pneumonia, unspecified organism: Secondary | ICD-10-CM | POA: Diagnosis not present

## 2022-07-04 LAB — CBC
HCT: 23.5 % — ABNORMAL LOW (ref 36.0–46.0)
Hemoglobin: 7.5 g/dL — ABNORMAL LOW (ref 12.0–15.0)
MCH: 28.7 pg (ref 26.0–34.0)
MCHC: 31.9 g/dL (ref 30.0–36.0)
MCV: 90 fL (ref 80.0–100.0)
Platelets: 376 10*3/uL (ref 150–400)
RBC: 2.61 MIL/uL — ABNORMAL LOW (ref 3.87–5.11)
RDW: 19.4 % — ABNORMAL HIGH (ref 11.5–15.5)
WBC: 14.6 10*3/uL — ABNORMAL HIGH (ref 4.0–10.5)
nRBC: 0 % (ref 0.0–0.2)

## 2022-07-04 LAB — GLUCOSE, CAPILLARY
Glucose-Capillary: 115 mg/dL — ABNORMAL HIGH (ref 70–99)
Glucose-Capillary: 164 mg/dL — ABNORMAL HIGH (ref 70–99)
Glucose-Capillary: 163 mg/dL — ABNORMAL HIGH (ref 70–99)
Glucose-Capillary: 134 mg/dL — ABNORMAL HIGH (ref 70–99)

## 2022-07-04 LAB — BASIC METABOLIC PANEL
Anion gap: 10 (ref 5–15)
BUN: 32 mg/dL — ABNORMAL HIGH (ref 6–20)
CO2: 25 mmol/L (ref 22–32)
Calcium: 8.4 mg/dL — ABNORMAL LOW (ref 8.9–10.3)
Chloride: 99 mmol/L (ref 98–111)
Creatinine, Ser: 3.95 mg/dL — ABNORMAL HIGH (ref 0.44–1.00)
GFR, Estimated: 13 mL/min — ABNORMAL LOW (ref >60)
Glucose, Bld: 130 mg/dL — ABNORMAL HIGH (ref 70–99)
Potassium: 3.3 mmol/L — ABNORMAL LOW (ref 3.5–5.1)
Sodium: 134 mmol/L — ABNORMAL LOW (ref 135–145)

## 2022-07-04 LAB — URIC ACID: Uric Acid, Serum: 6.2 mg/dL (ref 2.5–7.1)

## 2022-07-04 IMAGING — DX DG CHEST 1V PORT
1 series · 1 of 1 positions shown · non-contrast
Comparison: January 28, 2020

CLINICAL DATA: Chest pain starting 2 days ago.

EXAM:
PORTABLE CHEST 1 VIEW

[chest ap]
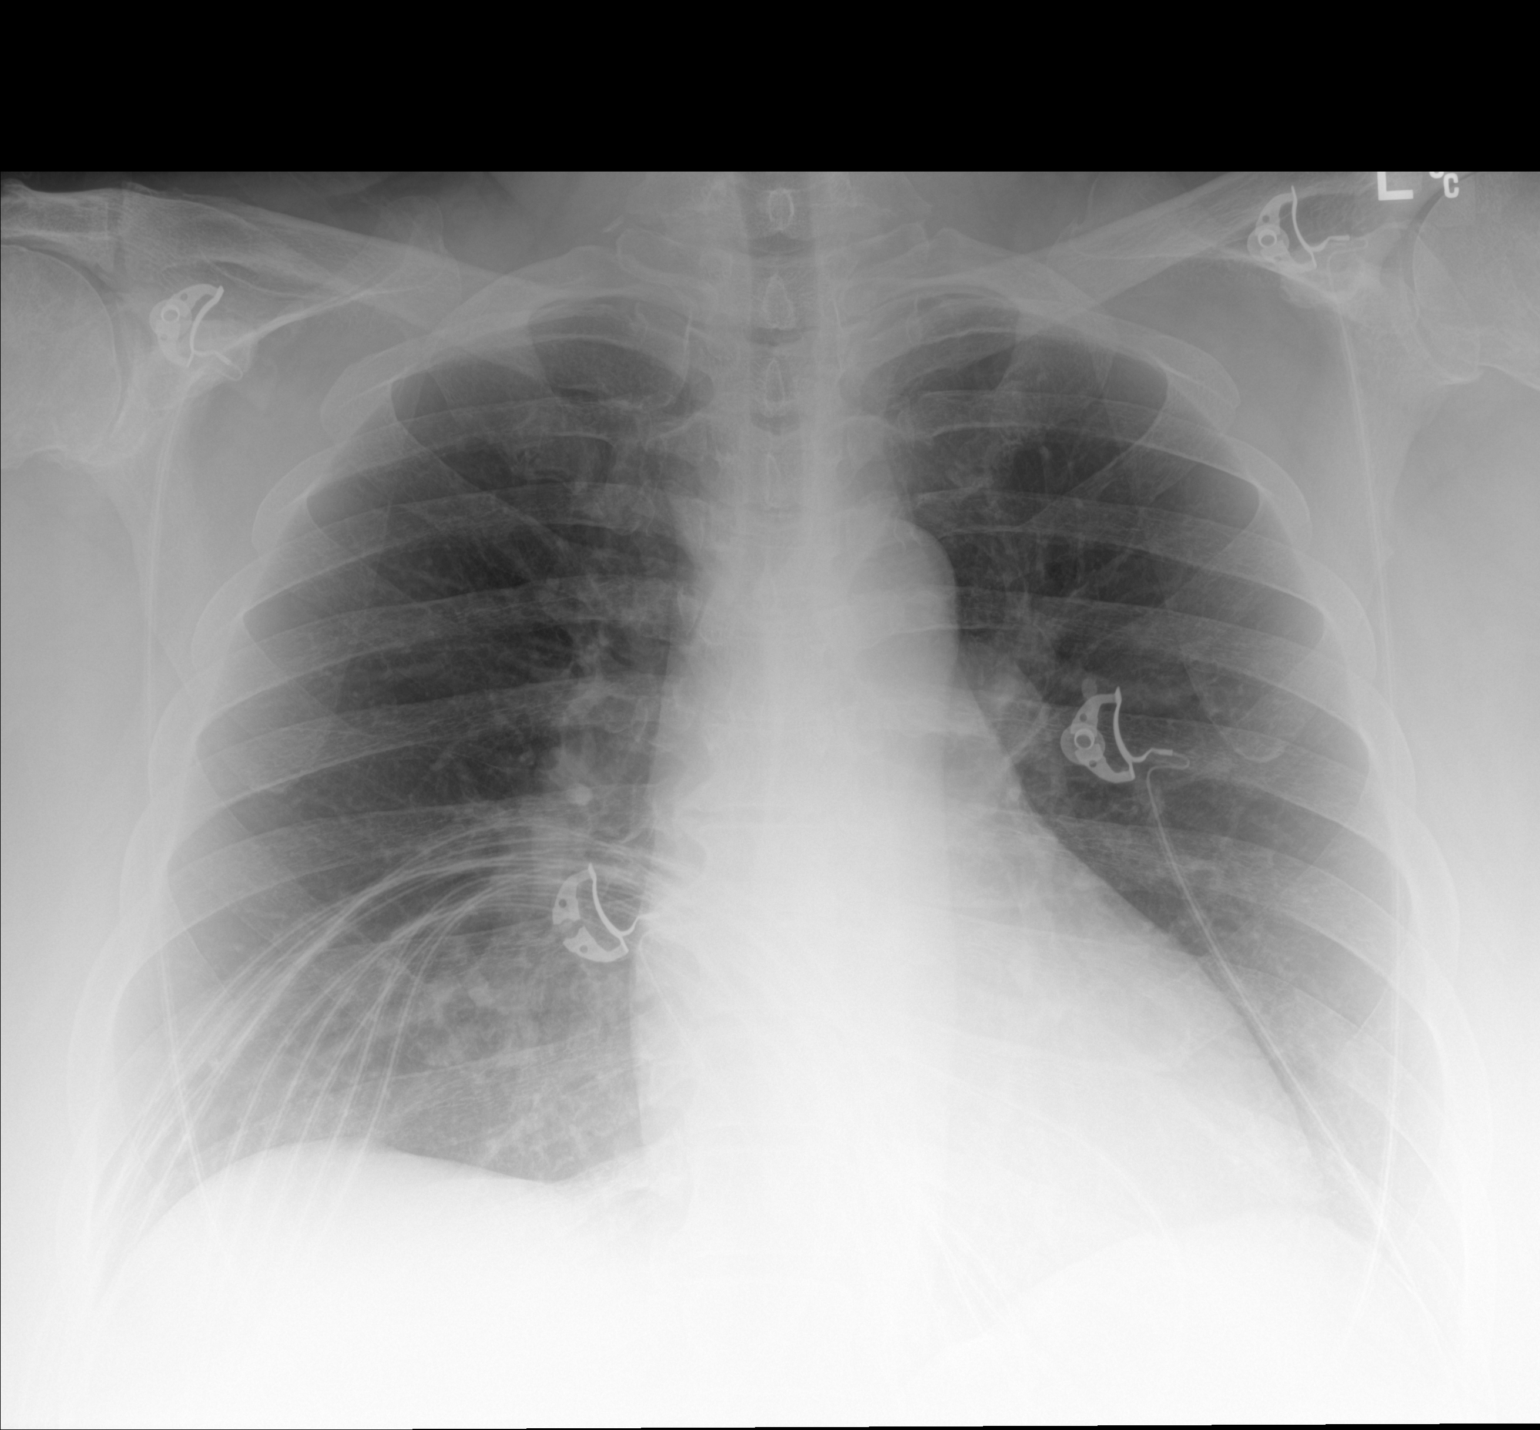

[1 of 1 positions shown; findings below may reference images not displayed]

FINDINGS: The heart size and mediastinal contours are within normal limits.
Both lungs are clear. The visualized skeletal structures are
unremarkable.
IMPRESSION: No active disease.

## 2022-07-04 MED ORDER — CHLORHEXIDINE GLUCONATE CLOTH 2 % EX PADS: 6.0000 | MEDICATED_PAD | Freq: Every day | CUTANEOUS | Status: DC

## 2022-07-04 MED ORDER — POTASSIUM CHLORIDE CRYS ER 20 MEQ PO TBCR
40.0000 meq | EXTENDED_RELEASE_TABLET | Freq: Every day | ORAL | Status: DC
  Administered 2022-07-04: 40 meq via ORAL
  Filled 2022-07-04: qty 2

## 2022-07-04 NOTE — Progress Notes (Signed)
Admit: 06/18/2022 LOS: 15  58F AoCKD4, multifocal pneumonia likley 2/2 metapneumovirus, recent admission for disseminated gout with encephalopathy and AKI, just discharged from SNF.   Subjective:   She had 425 mL UOP over 4/27.  Last HD on 4/27 with 2.5 kg UF. treatment ended around 4 am.  She got a dose of potassium (was ordered two but the second one hasn't gone in).  She has just gotten a bed bath and had an ice pack placed on her right arm.  Nursing gently moved her arm to help her place the ice pack on it and elevate it; she screamed out and is upset because this was painful.  She tells me that she thinks that she will be discharged tomorrow after transportation is set up for HD.   Review of systems:    Denies n/v Denies shortness of breath Denies chest pain  She still has a foley in and doesn't want this removed because she doesn't want to go on the bedside commode because it's low.  She doesn't want to use a bedpan and is frustrated when I brought this up. Discussed risks of infection.  Nursing states they have been told foley coming out in the morning   Physical Exam:  Blood pressure (!) 141/76, pulse 92, temperature 99.4 F (37.4 C), temperature source Oral, resp. rate 18, height 5\' 6"  (1.676 m), weight (!) 148.2 kg, last menstrual period 10/10/2012, SpO2 99 %. General adult female in bed in no acute distress     HEENT normocephalic atraumatic extraocular movements intact sclera anicteric Neck supple trachea midline Lungs clear to auscultation bilaterally normal work of breathing at rest on room air Heart S1S2 no rub Abdomen soft nontender obese habitus  Extremities diffuse 1-2+ edema lower extremities  Psych normal mood and affect Neuro - alert and oriented x 3 provides history and follows commands Access RIJ tunn catheter; LUE AVF with bruit and thrill      Date   Creat  eGFR  2018-2020  1.01- 1.39   2021   1.31- 2.70  2022   1.64- 4.38  2023   1.72- 6.25 Mar 2022  3.02-  5.93 8- 17 ml/min  March 2024  1.92- 4.59 10-30 ml/min  4/12   4.84  10 ml/min    4/13   4.82  4/14   4.83  06/20/21  4.99  9 ml/min     B/l creat 2.1- 3.1 from march 2023, eGFR 17- 28 ml/ min   UA 11-20 wbc, 5-10 rbc from 06/19/22, prot 30   CT abd renal --> normal appearing kidneys w/o obstruction    Assessment/ Plan AoCKD4 with progression to ESRD Recently with variable Creatinine 2.7 - 4.  Creat later here 4.8 on admission in setting of MPV resp infection and significant lower ext edema. Followed by Dr Thedore Mins at [facility]. UA unremarkable, CT abd w/o obstruction.  Per charting, Lasix started at 40 mg bid IV then titrated up to 120mg  IV tid but no sig improvement in wt's or edema. Pt's brother (esrd on HD) called and discussed with team that her QOL is "horrible" and has been for the last couple of years due to her chronic fluid overload issues and he doesn't see her getting better. He felt she would do better on dialysis. Nephrology d/w the patient and she acknowledged very difficult times lately due to chronic fluid overload refractory to diuretics.  Tunneled catheter placed by IR and pt had her 1st HD Friday 4/19.  She had a left brachiocephalic AVF placed on 4/24 with Dr. Chestine Spore   HD per MWF schedule  She has been accepted to [facility] on MWF schedule    I have ordered daily weights  I recommended lasix IV today.  She states that she doesn't want an IV because hers is out so cannot yet IV meds.  Will give lasix 80 mg daily for now then will need to transition to lasix on non-dialysis days on discharge  Vascular is setting up follow-up with [facility] for duplex in 4-6 weeks.  Per charting she will likely need superficialization  Metapneumovirus - admit diagnosis, w/ multifocal pneumonia R > L, sp IV vanc + cefepime, BCx NGTD. s/p po doxy.  Fluid volume overload - improving w/ dialysis Hypertension - controlled; optimize volume with HD.  If BP drops may need to lower the coreg however note that this  is also directed at her afib.   Gout - with severe hyperuricemia; per primary team  Anemia of CKD -   transfuse per primary team. S/p PRBC's on 4/25.  Started aranesp 40 mcg every Monday - increased to aranesp 100 mcg every Monday for next dose.  Iron is ok.  HFpEF - optimize volume with HD  Hypokalemia - replete with 40 meq potassium once today.  Discontinued order for a second 40 meq, though. Please use caution with K repletion in ESRD patients  DM2 with history of HHS.  Per primary team  Atrial fibrillation - per primary team.  Note that her only BP agent is also directed at her afib   Disposition - She has an AVF and outpatient HD unit.  Per charting she is refusing SNF.  She states that she still does need transportation to and from dialysis set up - I have sent a message to HD SW   Recent Labs  Lab 06/30/22 0501 06/30/22 1444 07/01/22 1107 07/02/22 0227 07/03/22 0652 07/04/22 0823  HGB 7.3*   < > 6.3*   < > 7.4* 7.5*  ALBUMIN 2.0*  --  1.8*  --   --   --   CALCIUM 8.0*  --  7.9*   < > 8.0* 8.4*  PHOS 5.2*  --  5.3*  --   --   --   CREATININE 4.90*  --  4.35*   < > 3.18* 3.95*  K 3.7  --  3.8   < > 3.3* 3.3*   < > = values in this interval not displayed.    Inpatient medications:  apixaban  5 mg Oral BID   atorvastatin  10 mg Oral Daily   carvedilol  12.5 mg Oral BID WC   Chlorhexidine Gluconate Cloth  6 each Topical Daily   Chlorhexidine Gluconate Cloth  6 each Topical Q0600   Chlorhexidine Gluconate Cloth  6 each Topical Q0600   [START ON 07/05/2022] darbepoetin (ARANESP) injection - NON-DIALYSIS  100 mcg Subcutaneous Q Mon-1800   febuxostat  40 mg Oral Daily   ferrous sulfate  325 mg Oral BID WC   folic acid  1 mg Oral Daily   furosemide  80 mg Oral Daily   guaiFENesin  600 mg Oral BID   heparin  3,000 Units Dialysis Once in dialysis   insulin aspart  0-5 Units Subcutaneous QHS   insulin aspart  0-6 Units Subcutaneous TID WC   insulin aspart  5 Units Subcutaneous  TID WC   insulin glargine-yfgn  15 Units Subcutaneous BID   mirtazapine  15 mg Oral QHS   pantoprazole  40 mg Oral BID   potassium chloride  40 mEq Oral Daily      albuterol, guaiFENesin, heparin, HYDROmorphone, loperamide, ondansetron **OR** ondansetron (ZOFRAN) IV, mouth rinse    Estanislado Emms, MD 3:34 PM 07/04/2022
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
## 2022-07-04 NOTE — Progress Notes (Signed)
PROGRESS NOTE   Deborah [name]  ZOX:[id number] DOB: 1965/09/03 DOA: 06/18/2022 PCP: Elsie Amis, MD  Brief Narrative:  57 year old black female  Atrial fibrillation CHADVASC >4/Eliquis CKD 4 Duodenal carcinoid resected 2021 Lumbar spinal stenosis previously wheelchair-bound-not an operative candidate per Dr. 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-has been transfused previously  Presented from skilled facility with shortness of breath fluid overload found to have multiple opacities on CXR COVID-19 negative + METAPNEUMOVIRUS positive Nephrology consulted for fluid overload  4/18 transferred to [facility] dialysis access 4/18 right IJ tunneled HD cath 4/24 creation left brachiocephalic fistula 8/11 1 unit PRBC given-a total of 3 4/27 right upper extremity duplex ordered  Hospital-Problem based course  Metapneumovirus infection with superimposed multifocal pneumonia Antibiotics completed on 4/24 and symptoms have improved-- leukocytosis cause unclear - monitor periodically  Atrial fibrillation CHADVASC >4 HFpEF EF 50-55% 09/11/2021 Management now with HD for dialysis in addition to p.o. Lasix 80 daily (lost IV) Continue Coreg 12.5 twice daily Adjust antihypertensives as EDW is being challenged  New ESRD and metabolic acidosis dialyzing via right IJ tunneled HD cath Maturing left brachiocephalic fistula--- is clipped for outpatient HD clinic on Monday--cannot go today because needs transport to the facility for dialysis and does not have independent transport so social work will need to check in with patient and facility tomorrow to arrange this Check phosphorus in a.m. with labs--acidosis resolved cautious with potassium replacement given ESRD If she is here tomorrow morning, will ask dialysis navigator/educator to teach the patient a little bit about  dialysis dietary restrictions Foley removal 4/28 and d/c with [facility] BSC  Normocytic anemia likely anemia renal disease Iron studies normal saturation ratio is okay-defer ESA to renal Continue oral iron 325 twice daily meals-- watch for constipation--no concerns at this time for any type of bleeding  Permanent A-fib CHADVASC >4 Continue Coreg 12.5 as above Continue Eliquis 5 twice daily  Painful and swollen right upper extremity Last hospitalization 05/2022 was felt to have disseminated gout[?] Stop allopurinol uric acid is only slightly elevated Ultrasound upper extremity right side 4/25 was negative for DVT  Chronic pain Lumbar stenosis Wheelchair-bound at home--can ambulate short distances Continues on Dilaudid 4 every 6 as needed pain  ?  Dementia Placed on Aricept on 4/13 which I will discontinue lowered dose of Remeron to 15 at bedtime  Stage II pressure injury POA to sacrum and heel Cleanse with saline Xeroform Lawson 294 and silicone foam on sacrum,, foam dressing to left heel change every 3 daily  BMI 51 Not sure if she has good insight-use on regular diet and will need education   DVT prophylaxis: Apixaban Code Status: Full Family Communication: None present Disposition:  Status is: Inpatient Remains inpatient appropriate because:   Requires further management inpatient and stability and likely discharge on 4/29 once transportation can be accomplished to and from dialysis    Subjective:  Doing fair Ready to remove Foley when she gets the bedside commode No chest pain no fever no nausea  Objective: Vitals:   07/03/22 0825 07/03/22 1630 07/04/22 0313 07/04/22 0855  BP: (!) 122/58 (!) 117/59 127/67 (!) 141/76  Pulse: 99 86 94 92  Resp: 16 18 20 18   Temp: 98.8 F (37.1 C) 97.8 F (36.6 C) 99.3 F (37.4 C) 99.4 F (37.4 C)  TempSrc: Oral Oral Oral Oral  SpO2: 98% 98% 99% 99%  Weight:  Height:        Intake/Output Summary (Last 24 hours) at  07/04/2022 1020 Last data filed at 07/04/2022 0800 Gross per 24 hour  Intake 320 ml  Output 325 ml  Net -5 ml    Filed Weights   06/28/22 0811 06/28/22 1203 07/03/22 0546  Weight: (!) 146.4 kg (!) 144.1 kg (!) 148.2 kg    Examination:  hick neck Mallampati 4 Chest is clear --TDC + placed left sided fistula in process of healing Right arm is less swollen and indurated also still tender Abdomen is obese poor exam Both lower extremities have grade 1 edema with significant adiposity I did not examine her pressure injuries today  Data Reviewed: personally reviewed   CBC    Component Value Date/Time   WBC 14.6 (H) 07/04/2022 0823   RBC 2.61 (L) 07/04/2022 0823   HGB 7.5 (L) 07/04/2022 0823   HGB 8.8 (L) 10/09/2019 0906   HGB 9.6 (L) 09/03/2019 1620   HCT 23.5 (L) 07/04/2022 0823   HCT 30.4 (L) 09/03/2019 1620   PLT 376 07/04/2022 0823   PLT 348 10/09/2019 0906   PLT 451 (H) 09/03/2019 1620   MCV 90.0 07/04/2022 0823   MCV 85 09/03/2019 1620   MCH 28.7 07/04/2022 0823   MCHC 31.9 07/04/2022 0823   RDW 19.4 (H) 07/04/2022 0823   RDW 13.6 09/03/2019 1620   LYMPHSABS 2.0 06/03/2022 0432   LYMPHSABS 2.2 09/03/2019 1620   MONOABS 1.0 06/03/2022 0432   EOSABS 0.0 06/03/2022 0432   EOSABS 0.3 09/03/2019 1620   BASOSABS 0.0 06/03/2022 0432   BASOSABS 0.1 09/03/2019 1620      Latest Ref Rng & Units 07/04/2022    8:23 AM 07/03/2022    6:52 AM 07/02/2022    2:27 AM  CMP  Glucose 70 - 99 mg/dL 829  562  130   BUN 6 - 20 mg/dL 32  23  42   Creatinine 0.44 - 1.00 mg/dL 8.65  7.84  6.96   Sodium 135 - 145 mmol/L 134  134  134   Potassium 3.5 - 5.1 mmol/L 3.3  3.3  3.4   Chloride 98 - 111 mmol/L 99  98  96   CO2 22 - 32 mmol/L 25  26  23    Calcium 8.9 - 10.3 mg/dL 8.4  8.0  8.3      Radiology Studies: No results found.   Scheduled Meds:  apixaban  5 mg Oral BID   atorvastatin  10 mg Oral Daily   carvedilol  12.5 mg Oral BID WC   Chlorhexidine Gluconate Cloth  6 each  Topical Daily   Chlorhexidine Gluconate Cloth  6 each Topical Q0600   Chlorhexidine Gluconate Cloth  6 each Topical Q0600   [START ON 07/05/2022] darbepoetin (ARANESP) injection - NON-DIALYSIS  100 mcg Subcutaneous Q Mon-1800   febuxostat  40 mg Oral Daily   ferrous sulfate  325 mg Oral BID WC   folic acid  1 mg Oral Daily   furosemide  80 mg Oral Daily   guaiFENesin  600 mg Oral BID   heparin  3,000 Units Dialysis Once in dialysis   insulin aspart  0-5 Units Subcutaneous QHS   insulin aspart  0-6 Units Subcutaneous TID WC   insulin aspart  5 Units Subcutaneous TID WC   insulin glargine-yfgn  15 Units Subcutaneous BID   mirtazapine  15 mg Oral QHS   pantoprazole  40 mg Oral BID   Continuous Infusions:  LOS: 15 days   Time spent: 62  Rhetta Mura, MD [facility] To contact the attending provider between 7A-7P or the covering provider during after hours 7P-7A, please log into the web site [email or web address] and access using universal  password for that web site. If you do not have the password, please call the hospital operator.  07/04/2022, 10:20 AM

## 2022-07-04 NOTE — Progress Notes (Addendum)
This care RN took over this patient's care after midnight. Per provider order from 4/27 afternoon to remove foley in place but foley was still in place. Care RN approach patient to remove the foley and increased risk of catheter associated UTI. Pt become upset and stated "we already had this conversation with AM RN that I am not removing this foley until they get me a bariatric adjustable BSC." Pt currently have a non-adjustable bariatric BSC in the room but patient states it is difficult for her to get up from that and wants one that is higher than that before they remove foley. Portable equipment paged.  Call back received from portable equipment that requested BSC not available at this time.   Care RN once again expressed concern of keeping the foley in place when not indicated can lead to UTI. Pt stats she understand but does not want foley removed. Pt also stated "I will talk to the doctor in the morning"  Foley care provided

## 2022-07-05 ENCOUNTER — Other Ambulatory Visit (HOSPITAL_COMMUNITY): Payer: Self-pay

## 2022-07-05 DIAGNOSIS — J189 Pneumonia, unspecified organism: Secondary | ICD-10-CM | POA: Diagnosis not present

## 2022-07-05 LAB — CBC
HCT: 24.1 % — ABNORMAL LOW (ref 36.0–46.0)
Hemoglobin: 7.3 g/dL — ABNORMAL LOW (ref 12.0–15.0)
MCH: 27.9 pg (ref 26.0–34.0)
MCHC: 30.3 g/dL (ref 30.0–36.0)
MCV: 92 fL (ref 80.0–100.0)
Platelets: 427 10*3/uL — ABNORMAL HIGH (ref 150–400)
RBC: 2.62 MIL/uL — ABNORMAL LOW (ref 3.87–5.11)
RDW: 19.2 % — ABNORMAL HIGH (ref 11.5–15.5)
WBC: 15.2 10*3/uL — ABNORMAL HIGH (ref 4.0–10.5)
nRBC: 0 % (ref 0.0–0.2)

## 2022-07-05 LAB — RENAL FUNCTION PANEL
Albumin: 1.9 g/dL — ABNORMAL LOW (ref 3.5–5.0)
Anion gap: 9 (ref 5–15)
BUN: 36 mg/dL — ABNORMAL HIGH (ref 6–20)
CO2: 24 mmol/L (ref 22–32)
Calcium: 8.1 mg/dL — ABNORMAL LOW (ref 8.9–10.3)
Chloride: 97 mmol/L — ABNORMAL LOW (ref 98–111)
Creatinine, Ser: 4.02 mg/dL — ABNORMAL HIGH (ref 0.44–1.00)
GFR, Estimated: 12 mL/min — ABNORMAL LOW (ref >60)
Glucose, Bld: 160 mg/dL — ABNORMAL HIGH (ref 70–99)
Phosphorus: 3.8 mg/dL (ref 2.5–4.6)
Potassium: 3.6 mmol/L (ref 3.5–5.1)
Sodium: 130 mmol/L — ABNORMAL LOW (ref 135–145)

## 2022-07-05 LAB — GLUCOSE, CAPILLARY
Glucose-Capillary: 142 mg/dL — ABNORMAL HIGH (ref 70–99)
Glucose-Capillary: 170 mg/dL — ABNORMAL HIGH (ref 70–99)
Glucose-Capillary: 161 mg/dL — ABNORMAL HIGH (ref 70–99)
Glucose-Capillary: 162 mg/dL — ABNORMAL HIGH (ref 70–99)

## 2022-07-05 MED ORDER — ANTICOAGULANT SODIUM CITRATE 4% (200MG/5ML) IV SOLN
5.0000 mL | Status: DC | PRN
  Filled 2022-07-05: qty 5

## 2022-07-05 MED ORDER — HEPARIN SODIUM (PORCINE) 1000 UNIT/ML DIALYSIS
3000.0000 [IU] | Freq: Once | INTRAMUSCULAR | Status: DC
Start: 2022-07-05 — End: 2022-07-05

## 2022-07-05 MED ORDER — ALTEPLASE 2 MG IJ SOLR: 2.0000 mg | Freq: Once | INTRAMUSCULAR | Status: DC | PRN

## 2022-07-05 MED ORDER — HEPARIN SODIUM (PORCINE) 1000 UNIT/ML DIALYSIS
3000.0000 [IU] | INTRAMUSCULAR | Status: DC | PRN
  Administered 2022-07-05: 3000 [IU] via INTRAVENOUS_CENTRAL
  Filled 2022-07-05: qty 3

## 2022-07-05 MED ORDER — FUROSEMIDE 80 MG PO TABS
80.0000 mg | ORAL_TABLET | Freq: Every day | ORAL | 0 refills | Status: DC
  Filled 2022-07-05: qty 30, 30d supply, fill #0

## 2022-07-05 MED ORDER — MIRTAZAPINE 15 MG PO TBDP: 15.0000 mg | ORAL_TABLET | Freq: Every day | ORAL | 0 refills | Status: DC

## 2022-07-05 MED ORDER — GUAIFENESIN ER 600 MG PO TB12
600.0000 mg | ORAL_TABLET | Freq: Two times a day (BID) | ORAL | 0 refills | Status: DC
  Filled 2022-07-05: qty 20, 10d supply, fill #0

## 2022-07-05 MED ORDER — INSULIN GLARGINE-YFGN 100 UNIT/ML ~~LOC~~ SOLN: 15.0000 [IU] | Freq: Two times a day (BID) | SUBCUTANEOUS | 0 refills | Status: DC

## 2022-07-05 MED ORDER — HYDROMORPHONE HCL 4 MG PO TABS
4.0000 mg | ORAL_TABLET | Freq: Four times a day (QID) | ORAL | 0 refills | Status: AC | PRN
  Filled 2022-07-05: qty 12, 3d supply, fill #0

## 2022-07-05 MED ORDER — HEPARIN SODIUM (PORCINE) 1000 UNIT/ML DIALYSIS
1000.0000 [IU] | INTRAMUSCULAR | Status: DC | PRN
  Administered 2022-07-05: 1000 [IU]
  Filled 2022-07-05: qty 1

## 2022-07-05 NOTE — Plan of Care (Signed)
  Problem: Education: Goal: Knowledge of General Education information will improve Description: Including pain rating scale, medication(s)/side effects and non-pharmacologic comfort measures Outcome: Completed/Met   

## 2022-07-05 NOTE — Progress Notes (Signed)
Patient refusing her foley cath removed.

## 2022-07-05 NOTE — Progress Notes (Signed)
Appears pt for possible d/c after HD. Contacted [facility] to advise clinic of pt's possible d/c today and that pt will start on Wednesday. Contacted renal PA regarding clinic's need for orders as well. Arrangements have been placed on AVS.   Olivia Canter Renal Navigator [phone]

## 2022-07-05 NOTE — Discharge Summary (Signed)
Physician Discharge Summary  DMYA LONG UEA:[id number] DOB: 01-30-1966 DOA: 06/18/2022  PCP: Elsie Amis, MD  Admit date: 06/18/2022 Discharge date: 07/05/2022  Time spent: 27 minutes  Recommendations for Outpatient Follow-up:  Requires CBC Chem-12 phosphorus mag and the next several days at new HD center Recommend continued education about diabetic diet diet with fluid restriction diet in the outpatient setting it is likely she will not comply Given 3 days of Dilaudid for back pain-she is aware she needs to contact her primary care physician for refills after that Adjustments made to antihypertensives in addition to hypoglycemic agents given her new dialysis date She will be dialyzing at [facility] MWF CC Dr. Clotilde Dieter vascular for duplex 4 to 6 weeks coordination for left upper extremity brachiocephalic AVF Continue colchicine as an outpatient-would hold allopurinol  Discharge Diagnoses:  MAIN problem for hospitalization   Sepsis on admission secondary to metapneumovirus with multifocal pneumonia and completed antibiotics this hospital stay New declared ESRD now MWF and clipped as above Poorly controlled diabetes mellitus with diet noncompliance Chronic low back pain on opiates--Nonoperative candidate previously and is partially wheelchair-bound at baseline Super morbid obesity BMI 52 with ambulatory dysfunction Atrial fibrillation CHADVASC >4 on Eliquis Gout off allopurinol now  Please see below for itemized issues addressed in HOpsital- refer to other progress notes for clarity if needed  Discharge Condition: Fair  Diet recommendation: Diabetic renal diet with fluid restriction  Filed Weights   06/28/22 0811 06/28/22 1203 07/03/22 0546  Weight: (!) 146.4 kg (!) 144.1 kg (!) 148.2 kg    History of present illness:  57 year old black female  Atrial fibrillation CHADVASC >4/Eliquis CKD 4 Duodenal carcinoid resected 2021 Lumbar spinal stenosis  previously wheelchair-bound-not an operative candidate per Dr. 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-has been transfused previously   Presented from skilled facility with shortness of breath fluid overload found to have multiple opacities on CXR COVID-19 negative + METAPNEUMOVIRUS positive Nephrology consulted for fluid overload   4/18 transferred to [facility] dialysis access 4/18 right IJ tunneled HD cath 4/24 creation left brachiocephalic fistula 4/78 1 unit PRBC given-a total of 3 4/27 right upper extremity duplex ordered  Hospital Course:  Metapneumovirus infection with superimposed multifocal pneumonia as well as sepsis on admission Antibiotics completed on 4/24 and symptoms have improved-- leukocytosis cause unclear - monitor periodically   Atrial fibrillation CHADVASC >4 HFpEF EF 50-55% 09/11/2021 Management now with HD for dialysis in addition to p.o. Lasix 80 daily (lost IV) Continue Coreg 12.5 twice daily Adjusted antihypertensives as EDW is being challenged and will continue to be challenged in the outpatient setting although requires Coreg as above   New ESRD and metabolic acidosis dialyzing via right IJ tunneled HD cath Maturing left brachiocephalic fistula--- CC Dr. Chestine Spore vascular for outpatient follow-up Phosphorus okay at discharge cautious with potassium replacement given ESRD If she is here tomorrow morning, will ask dialysis navigator/educator to teach the patient a little bit about dialysis dietary restrictions Foley removal 4/28 and d/c with [facility]   Normocytic anemia likely anemia renal disease Iron studies normal saturation ratio is okay-defer ESA to renal Continue oral iron 325 twice daily meals-- watch for constipatio   Permanent A-fib CHADVASC >4 Continue Coreg 12.5 as above Continue Eliquis 5 twice daily   Painful and swollen right upper  extremity Last hospitalization 05/2022 was felt to have disseminated gout[?] Stop allopurinol  uric acid is only slightly elevated Ultrasound upper extremity right side 4/25 was negative for DVT   Chronic pain Lumbar stenosis Wheelchair-bound at home--can ambulate short distances Continues on Dilaudid 4 every 6 as needed pain-very limited prescription given she is aware that she will need this in the outpatient setting   ?  Dementia Placed on Aricept on 4/13 which I will discontinue lowered dose of Remeron to 15 at bedtime   Stage II pressure injury POA to sacrum and heel Cleanse with saline Xeroform Lawson 294 and silicone foam on sacrum,, foam dressing to left heel change every 3 daily   BMI 51 Not sure if she has good insight-use on regular diet and will need education   Discharge Exam: Vitals:   07/05/22 0539 07/05/22 0825  BP: (!) 142/59 (!) 146/66  Pulse: 96 96  Resp: 19 17  Temp: 99.3 F (37.4 C) 99.2 F (37.3 C)  SpO2: 98% 97%    Subj on day of d/c   Awake coherent no distress a little sleepy as did not rest much overnight No chest pain No fever  General Exam on discharge  EOMI NCAT thick neck Mallampati 4 right TDC left maturing fistula  S1-S2 no murmur  right arm is still quite tender in the hand Anterolaterally chest is clear Abdomen is obese nontender Foley still in place and we will remove this Neuro is intact To power she moves all 4 limbs relatively equally  Discharge Instructions    Allergies as of 07/05/2022       Reactions   Diazepam [name]   Gabapentin [name], Swelling   Other reaction(s): Unknown   Iodinated Contrast Media Anaphylaxis   11/29/17 Cardiac arrest 1 min after IV contrast, possible allergy vs vasovagal episode Iopamidol  Anaphylaxis  High 11/28/2017  Patient had seizure like activity and then code post 100 cc of isovue 300     Isovue [iopamidol] Anaphylaxis   11/28/17 Patient had seizure like  activity and then 1 min code after 100 cc of isovue 300. Possible contrast allergy vs vasovagal episode   Lisinopril Anaphylaxis   Tongue and mouth swelling   Metoclopramide Other (See Comments)   Tardive dyskinesia Also known as Reglan    Nsaids Anaphylaxis, Other (See Comments)   ULCER   Penicillins Palpitations   Has patient had a PCN reaction causing immediate rash, facial/tongue/throat swelling, SOB or lightheadedness with hypotension: Yes, heart races Has patient had a PCN reaction causing severe rash involving mucus membranes or skin necrosis: No Has patient had a PCN reaction that required hospitalization: Yes  Has patient had a PCN reaction occurring within the last 10 years: No   Tolmetin Nausea Only, Other (See Comments)   ULCER   Dicyclomine Other (See Comments)   Chest pain   Acetaminophen Nausea Only, Other (See Comments)   Irritates stomach ulcer; Abdominal pain   Cyclobenzaprine Palpitations   Oxycodone Palpitations   Rifamycins Palpitations   Tramadol Nausea And Vomiting        Medication List     STOP taking these medications    allopurinol 100 MG tablet Commonly known as: ZYLOPRIM   amLODipine 10 MG tablet Commonly known as: NORVASC   cetirizine 10 MG tablet Commonly known as: ZYRTEC   donepezil 5 MG tablet Commonly known as: ARICEPT   febuxostat 40 MG tablet Commonly known as: ULORIC   hydrALAZINE 50 MG tablet Commonly known as: APRESOLINE   nystatin powder Generic drug: nystatin  omeprazole 20 MG capsule Commonly known as: PRILOSEC   predniSONE 5 MG tablet Commonly known as: DELTASONE   Senna Plus 8.6-50 MG tablet Generic drug: senna-docusate   sodium bicarbonate 650 MG tablet   torsemide 20 MG tablet Commonly known as: DEMADEX       TAKE these medications    albuterol (2.5 MG/3ML) 0.083% nebulizer solution Commonly known as: PROVENTIL Take 3 mLs (2.5 mg total) by nebulization every 6 (six) hours as needed for wheezing or  shortness of breath.   apixaban 5 MG Tabs tablet Commonly known as: ELIQUIS Take 5 mg by mouth 2 (two) times daily.   atorvastatin 10 MG tablet Commonly known as: LIPITOR Take 10 mg by mouth at bedtime.   carvedilol 12.5 MG tablet Commonly known as: COREG Take 1 tablet (12.5 mg total) by mouth 2 (two) times daily with a meal.   colchicine 0.6 MG tablet Take 0.5 tablets (0.3 mg total) by mouth daily.   diclofenac Sodium 1 % Gel Commonly known as: VOLTAREN Apply 4 g topically 4 (four) times daily.   Easy Comfort Pen Needles 31G X 5 MM Misc Generic drug: Insulin Pen Needle USE 3 TIMES A DAY FOR INSULIN ADMINISTRATION   famotidine 20 MG tablet Commonly known as: PEPCID Take 1 tablet (20 mg total) by mouth daily.   ferrous sulfate 325 (65 FE) MG tablet Take 1 tablet (325 mg total) by mouth 2 (two) times daily with a meal.   fluticasone 50 MCG/ACT nasal spray Commonly known as: FLONASE Place 2 sprays into both nostrils daily as needed for allergies or rhinitis. What changed:  how much to take reasons to take this   folic acid 1 MG tablet Commonly known as: FOLVITE Take 1 mg by mouth daily.   furosemide 80 MG tablet Commonly known as: LASIX Take 1 tablet (80 mg total) by mouth daily. Start taking on: July 06, 2022   guaiFENesin 600 MG 12 hr tablet Commonly known as: MUCINEX Take 1 tablet (600 mg total) by mouth 2 (two) times daily.   HYDROmorphone 4 MG tablet Commonly known as: DILAUDID Take 1 tablet (4 mg total) by mouth every 6 (six) hours as needed for up to 3 days for severe pain. What changed:  medication strength how much to take when to take this   insulin aspart 100 UNIT/ML injection Commonly known as: novoLOG Inject 5 Units into the skin 3 (three) times daily with meals.   insulin glargine-yfgn 100 UNIT/ML injection Commonly known as: SEMGLEE Inject 0.15 mLs (15 Units total) into the skin 2 (two) times daily. What changed: how much to take    insulin lispro 100 UNIT/ML KwikPen Commonly known as: HUMALOG Before each meal 3 times a day, 140-199 - 2 units, 200-250 - 6 units, 251-299 - 8 units,  300-349 - 12 units,  350 or above 14 units. What changed:  how much to take how to take this when to take this additional instructions   loperamide 2 MG tablet Commonly known as: IMODIUM A-D Take 2 mg by mouth every 6 (six) hours as needed for diarrhea or loose stools.   mirtazapine 15 MG disintegrating tablet Commonly known as: REMERON SOL-TAB Take 1 tablet (15 mg total) by mouth at bedtime. What changed: Another medication with the same name was removed. Continue taking this medication, and follow the directions you see here.   multivitamin with minerals Tabs tablet Take 1 tablet by mouth daily.   ondansetron 4 MG disintegrating tablet Commonly  known as: Zofran ODT Take 1 tablet (4 mg total) by mouth every 8 (eight) hours as needed for nausea or vomiting.   pantoprazole 40 MG tablet Commonly known as: PROTONIX TAKE 1 TABLET BY MOUTH 2 (TWO) TIMES DAILY. (AM+BEDTIME)   Vitamin D (Ergocalciferol) 1.25 MG (50000 UNIT) Caps capsule Commonly known as: DRISDOL Take 50,000 Units by mouth once a week.               Durable Medical Equipment  (From admission, onward)           Start     Ordered   07/05/22 1059  For home use only DME Specialty mattress  Once       Comments: Low air loss.   07/05/22 1101           Allergies  Allergen Reactions   Diazepam [name]   Gabapentin [name] and Swelling    Other reaction(s): Unknown   Iodinated Contrast Media Anaphylaxis    11/29/17 Cardiac arrest 1 min after IV contrast, possible allergy vs vasovagal episode Iopamidol  Anaphylaxis  High 11/28/2017  Patient had seizure like activity and then code post 100 cc of isovue 300     Isovue [Iopamidol] Anaphylaxis    11/28/17 Patient had seizure like activity and then 1 min code after 100 cc of  isovue 300. Possible contrast allergy vs vasovagal episode   Lisinopril Anaphylaxis    Tongue and mouth swelling   Metoclopramide Other (See Comments)    Tardive dyskinesia  Also known as Reglan    Nsaids Anaphylaxis and Other (See Comments)    ULCER   Penicillins Palpitations    Has patient had a PCN reaction causing immediate rash, facial/tongue/throat swelling, SOB or lightheadedness with hypotension: Yes, heart races Has patient had a PCN reaction causing severe rash involving mucus membranes or skin necrosis: No Has patient had a PCN reaction that required hospitalization: Yes  Has patient had a PCN reaction occurring within the last 10 years: No    Tolmetin Nausea Only and Other (See Comments)    ULCER   Dicyclomine Other (See Comments)    Chest pain   Acetaminophen Nausea Only and Other (See Comments)    Irritates stomach ulcer; Abdominal pain   Cyclobenzaprine Palpitations   Oxycodone Palpitations   Rifamycins Palpitations   Tramadol Nausea And Vomiting    Follow-up Information     [facility] Follow up in 6 week(s).   Specialty: Vascular Surgery Why: Office will call you to arrange your appt (sent). Contact information: [address] [place] [address] [phone]        Center[facility]. Go on 07/05/2022.   Why: Schedule is Monday/Wednesday/Friday with 12:10 chair time.  On Monday, please arrive at 11:15 to complete paperwork prior to treatment. Contact information: [address] [place] [address] [phone]                  The results of significant diagnostics from this hospitalization (including imaging, microbiology, ancillary and laboratory) are listed below for reference.    Significant Diagnostic Studies: VAS [place] UPPER EXTREMITY VENOUS DUPLEX  Result Date: 07/02/2022 UPPER VENOUS STUDY  Patient Name:  MA MUNOZ  Date of Exam:   07/01/2022 Medical  Rec #: [id number]             Accession #:    [id number] Date of Birth: 1966-01-09  Patient Gender: F Patient Age:   57 years Exam Location:  [facility] Procedure:      VAS [place] UPPER EXTREMITY VENOUS DUPLEX Referring Phys: RAMESH KC --------------------------------------------------------------------------------  Indications: Right edema Other Indications: Right subclavian tunneled hemodialysis catheter. Comparison Study: No prior studies. Performing Technologist: Jean Rosenthal RDMS, RVT  Examination Guidelines: A complete evaluation includes B-mode imaging, spectral Doppler, color Doppler, and power Doppler as needed of all accessible portions of each vessel. Bilateral testing is considered an integral part of a complete examination. Limited examinations for reoccurring indications may be performed as noted.  Right Findings: +----------+------------+---------+-----------+----------+-------+ RIGHT     CompressiblePhasicitySpontaneousPropertiesSummary +----------+------------+---------+-----------+----------+-------+ IJV           Full       Yes       Yes                      +----------+------------+---------+-----------+----------+-------+ Subclavian               Yes       Yes                      +----------+------------+---------+-----------+----------+-------+ Axillary      Full       Yes       Yes                      +----------+------------+---------+-----------+----------+-------+ Brachial      Full                                          +----------+------------+---------+-----------+----------+-------+ Radial        Full                                          +----------+------------+---------+-----------+----------+-------+ Ulnar         Full                                          +----------+------------+---------+-----------+----------+-------+ Cephalic      Full                                           +----------+------------+---------+-----------+----------+-------+ Basilic       Full                                          +----------+------------+---------+-----------+----------+-------+  Left Findings: +----------+------------+---------+-----------+----------+---------------------+ LEFT      CompressiblePhasicitySpontaneousProperties       Summary        +----------+------------+---------+-----------+----------+---------------------+ Subclavian                                           Unable to evaluate-  recent AVF, patient                                                        declining imaging                                                               left          +----------+------------+---------+-----------+----------+---------------------+  Summary:  Right: No evidence of deep vein thrombosis in the upper extremity. No evidence of superficial vein thrombosis in the upper extremity.  *See table(s) above for measurements and observations.  Diagnosing physician: Gerarda Fraction Electronically signed by Gerarda Fraction on 07/02/2022 at 4:32:42 PM.    Final    VAS [place] UPPER EXT VEIN MAPPING (PRE-OP AVF)  Result Date: 06/25/2022 UPPER EXTREMITY VEIN MAPPING Patient Name:  SIA GABRIELSEN  Date of Exam:   06/25/2022 Medical Rec #: [id number]             Accession #:    [id number] Date of Birth: Jul 19, 1965             Patient Gender: F Patient Age:   5 years Exam Location:  [facility] Procedure:      VAS [place] UPPER EXT VEIN MAPPING (PRE-OP AVF) Referring Phys: Heath Lark --------------------------------------------------------------------------------  Indications: Pre-access. Limitations: Patient positioning and small diameter of vessels. Comparison Study: No previous study Performing Technologist: McKayla Maag RVT, VT  Examination Guidelines: A complete evaluation includes B-mode imaging, spectral  Doppler, color Doppler, and power Doppler as needed of all accessible portions of each vessel. Bilateral testing is considered an integral part of a complete examination. Limited examinations for reoccurring indications may be performed as noted. +-----------------+-------------+----------+--------------+ Right Cephalic   Diameter (cm)Depth (cm)   Findings    +-----------------+-------------+----------+--------------+ Shoulder             0.19        1.34                  +-----------------+-------------+----------+--------------+ Prox upper arm       0.27        1.62                  +-----------------+-------------+----------+--------------+ Mid upper arm        0.33        0.99                  +-----------------+-------------+----------+--------------+ Dist upper arm       0.25        0.58                  +-----------------+-------------+----------+--------------+ Antecubital fossa    0.37        0.39                  +-----------------+-------------+----------+--------------+ Prox forearm         0.07        0.44                  +-----------------+-------------+----------+--------------+ Mid forearm  not visualized +-----------------+-------------+----------+--------------+ Dist forearm                            not visualized +-----------------+-------------+----------+--------------+ Wrist                                   not visualized +-----------------+-------------+----------+--------------+ +-----------------+-------------+----------+--------------+ Right Basilic    Diameter (cm)Depth (cm)   Findings    +-----------------+-------------+----------+--------------+ Shoulder                                not visualized +-----------------+-------------+----------+--------------+ Prox upper arm                          not visualized +-----------------+-------------+----------+--------------+ Mid upper arm                            not visualized +-----------------+-------------+----------+--------------+ Dist upper arm                          not visualized +-----------------+-------------+----------+--------------+ Antecubital fossa    0.07        0.52                  +-----------------+-------------+----------+--------------+ Prox forearm         0.11        0.41                  +-----------------+-------------+----------+--------------+ Mid forearm                             not visualized +-----------------+-------------+----------+--------------+ Distal forearm                          not visualized +-----------------+-------------+----------+--------------+ Elbow                                   not visualized +-----------------+-------------+----------+--------------+ Wrist                                   not visualized +-----------------+-------------+----------+--------------+ +-----------------+-------------+----------+---------+ Left Cephalic    Diameter (cm)Depth (cm)Findings  +-----------------+-------------+----------+---------+ Shoulder             0.44        2.10             +-----------------+-------------+----------+---------+ Prox upper arm       0.36        1.82             +-----------------+-------------+----------+---------+ Mid upper arm        0.36        1.36             +-----------------+-------------+----------+---------+ Dist upper arm       0.34        1.11   branching +-----------------+-------------+----------+---------+ Antecubital fossa    0.37        0.81             +-----------------+-------------+----------+---------+ Prox forearm  thrombus  +-----------------+-------------+----------+---------+ Mid forearm                             thrombus  +-----------------+-------------+----------+---------+ Dist forearm                            thrombus   +-----------------+-------------+----------+---------+ +-----------------+-------------+----------+--------------+ Left Basilic     Diameter (cm)Depth (cm)   Findings    +-----------------+-------------+----------+--------------+ Shoulder                                not visualized +-----------------+-------------+----------+--------------+ Prox upper arm                          not visualized +-----------------+-------------+----------+--------------+ Mid upper arm                           not visualized +-----------------+-------------+----------+--------------+ Dist upper arm                          not visualized +-----------------+-------------+----------+--------------+ Antecubital fossa                       not visualized +-----------------+-------------+----------+--------------+ Prox forearm         0.18        0.37                  +-----------------+-------------+----------+--------------+ Mid forearm          0.15        0.19                  +-----------------+-------------+----------+--------------+ Distal forearm       0.11        0.29                  +-----------------+-------------+----------+--------------+ Wrist                                   not visualized +-----------------+-------------+----------+--------------+ Acute superficial thrombus formation in the left cephalic vein proximal forearm to distal forearm. *See table(s) above for measurements and observations.  Diagnosing physician: Sherald Hess MD Electronically signed by Sherald Hess MD on 06/25/2022 at 4:19:26 PM.    Final    IR Fluoro Guide CV Line Right  Result Date: 06/24/2022 INDICATION: 57 year old female referred for tunneled hemodialysis catheter EXAM: TUNNELED CENTRAL VENOUS HEMODIALYSIS CATHETER PLACEMENT WITH ULTRASOUND AND FLUOROSCOPIC GUIDANCE MEDICATIONS: 1 g vancomycin. The antibiotic was given in an appropriate time interval prior to skin puncture.  ANESTHESIA/SEDATION: Moderate (conscious) sedation was not employed during this procedure. A total of Versed 0.5 mg and Fentanyl 0 mcg was administered intravenously by the radiology nurse. Total intra-service moderate Sedation Time: 0 minutes. The patient's level of consciousness and vital signs were monitored continuously by radiology nursing throughout the procedure under my direct supervision. FLUOROSCOPY: Radiation Exposure Index (as provided by the fluoroscopic device): 6 mGy Kerma COMPLICATIONS: None PROCEDURE: Informed written consent was obtained from the patient after a discussion of the risks, benefits, and alternatives to treatment. Questions regarding the procedure were encouraged and answered. The right neck and chest were prepped with chlorhexidine in a sterile fashion, and a sterile drape was applied  covering the operative field. Maximum barrier sterile technique with sterile gowns and gloves were used for the procedure. A timeout was performed prior to the initiation of the procedure. Ultrasound survey was performed. The right internal jugular vein was confirmed to be patent, with images stored and sent to PACS. Micropuncture kit was utilized to access the right internal jugular vein under direct, real-time ultrasound guidance after the overlying soft tissues were anesthetized with 1% lidocaine with epinephrine. Stab incision was made with 11 blade scalpel. Microwire was passed centrally. The microwire was then marked to measure appropriate internal catheter length. External tunneled length was estimated. A total tip to cuff length of 23 cm was selected. 035 guidewire was advanced to the level of the IVC. Skin and subcutaneous tissues of chest wall below the clavicle were generously infiltrated with 1% lidocaine for local anesthesia. A small stab incision was made with 11 blade scalpel. The selected hemodialysis catheter was tunneled in a retrograde fashion from the anterior chest wall to the  venotomy incision. Serial dilation was performed and then a peel-away sheath was placed. The catheter was then placed through the peel-away sheath with tips ultimately positioned within the superior aspect of the right atrium. Final catheter positioning was confirmed and documented with a spot radiographic image. The catheter aspirates and flushes normally. The catheter was flushed with appropriate volume heparin dwells. The catheter exit site was secured with a 0-Prolene retention suture. Gel-Foam slurry was infused into the soft tissue tract. The venotomy incision was closed Derma bond and sterile dressing. Dressings were applied at the chest wall. Patient tolerated the procedure well and remained hemodynamically stable throughout. No complications were encountered and no significant blood loss encountered. IMPRESSION: Status post image guided right IJ tunneled hemodialysis catheter. Signed, Yvone Neu. Miachel Roux, RPVI [facility] Special[facility] Radiology Electronically Signed   By: Gilmer Mor D.O.   On: 06/24/2022 17:23   IR US Guide Vasc Access Right  Result Date: 06/24/2022 INDICATION: 57 year old female referred for tunneled hemodialysis catheter EXAM: TUNNELED CENTRAL VENOUS HEMODIALYSIS CATHETER PLACEMENT WITH ULTRASOUND AND FLUOROSCOPIC GUIDANCE MEDICATIONS: 1 g vancomycin. The antibiotic was given in an appropriate time interval prior to skin puncture. ANESTHESIA/SEDATION: Moderate (conscious) sedation was not employed during this procedure. A total of Versed 0.5 mg and Fentanyl 0 mcg was administered intravenously by the radiology nurse. Total intra-service moderate Sedation Time: 0 minutes. The patient's level of consciousness and vital signs were monitored continuously by radiology nursing throughout the procedure under my direct supervision. FLUOROSCOPY: Radiation Exposure Index (as provided by the fluoroscopic device): 6 mGy Kerma COMPLICATIONS: None  PROCEDURE: Informed written consent was obtained from the patient after a discussion of the risks, benefits, and alternatives to treatment. Questions regarding the procedure were encouraged and answered. The right neck and chest were prepped with chlorhexidine in a sterile fashion, and a sterile drape was applied covering the operative field. Maximum barrier sterile technique with sterile gowns and gloves were used for the procedure. A timeout was performed prior to the initiation of the procedure. Ultrasound survey was performed. The right internal jugular vein was confirmed to be patent, with images stored and sent to PACS. Micropuncture kit was utilized to access the right internal jugular vein under direct, real-time ultrasound guidance after the overlying soft tissues were anesthetized with 1% lidocaine with epinephrine. Stab incision was made with 11 blade scalpel. Microwire was passed centrally. The microwire was then marked to measure appropriate internal catheter length. External tunneled  length was estimated. A total tip to cuff length of 23 cm was selected. 035 guidewire was advanced to the level of the IVC. Skin and subcutaneous tissues of chest wall below the clavicle were generously infiltrated with 1% lidocaine for local anesthesia. A small stab incision was made with 11 blade scalpel. The selected hemodialysis catheter was tunneled in a retrograde fashion from the anterior chest wall to the venotomy incision. Serial dilation was performed and then a peel-away sheath was placed. The catheter was then placed through the peel-away sheath with tips ultimately positioned within the superior aspect of the right atrium. Final catheter positioning was confirmed and documented with a spot radiographic image. The catheter aspirates and flushes normally. The catheter was flushed with appropriate volume heparin dwells. The catheter exit site was secured with a 0-Prolene retention suture. Gel-Foam slurry was  infused into the soft tissue tract. The venotomy incision was closed Derma bond and sterile dressing. Dressings were applied at the chest wall. Patient tolerated the procedure well and remained hemodynamically stable throughout. No complications were encountered and no significant blood loss encountered. IMPRESSION: Status post image guided right IJ tunneled hemodialysis catheter. Signed, Yvone Neu. Miachel Roux, RPVI Vascular and Interventional Radiology Specialists [facility] Radiology Electronically Signed   By: Gilmer Mor D.O.   On: 06/24/2022 17:23   CT CHEST WO CONTRAST  Result Date: 06/19/2022 CLINICAL DATA:  Abnormal x-ray, lung opacities. [name] and cough. EXAM: CT CHEST WITHOUT CONTRAST TECHNIQUE: Multidetector CT imaging of the chest was performed following the standard protocol without IV contrast. RADIATION DOSE REDUCTION: This exam was performed according to the departmental dose-optimization program which includes automated exposure control, adjustment of the mA and/or kV according to patient size and/or use of iterative reconstruction technique. COMPARISON:  05/10/2022. FINDINGS: Cardiovascular: The heart is normal in size and there is no pericardial effusion. A few scattered coronary artery calcifications are noted. The aorta is normal in caliber. The pulmonary trunk is distended suggesting underlying pulmonary artery hypertension. Mediastinum/Nodes: No mediastinal or axillary lymphadenopathy. Evaluation of the hila is limited due to lack of IV contrast. The thyroid gland, trachea, and esophagus are within normal limits. There is a small hiatal hernia. Lungs/Pleura: Patchy infiltrates are noted in the right lower lobe and there is consolidation in the right upper lobe. A few opacities are noted in the left upper lobe. No effusion or pneumothorax. Upper Abdomen: The gallbladder is surgically absent. No acute abnormality. Musculoskeletal: Degenerative changes are present in the  thoracic spine. No acute osseous abnormality. IMPRESSION: 1. Findings suggestive of multifocal pneumonia, most pronounced in the right lung. 2. Coronary artery calcifications. 3. Aortic atherosclerosis. Electronically Signed   By: Thornell Sartorius M.D.   On: 06/19/2022 04:30   CT Renal Stone Study  Result Date: 06/18/2022 CLINICAL DATA:  Diffuse abdominal pain, difficulty breathing. EXAM: CT ABDOMEN AND PELVIS WITHOUT CONTRAST TECHNIQUE: Multidetector CT imaging of the abdomen and pelvis was performed following the standard protocol without IV contrast. RADIATION DOSE REDUCTION: This exam was performed according to the departmental dose-optimization program which includes automated exposure control, adjustment of the mA and/or kV according to patient size and/or use of iterative reconstruction technique. COMPARISON:  06/02/2022. FINDINGS: Lower chest: A few coronary artery calcifications are noted. Patchy infiltrates are noted in the right lower lobe, concerning for pneumonia. Hepatobiliary: No focal liver abnormality is seen. Status post cholecystectomy. No biliary dilatation. Pancreas: Unremarkable. No pancreatic ductal dilatation or surrounding inflammatory changes. Spleen: Normal in size without  focal abnormality. Adrenals/Urinary Tract: The adrenal glands are within normal limits. No renal calculus or hydronephrosis bilaterally. The bladder is unremarkable. Stomach/Bowel: There is a small hiatal hernia. Stomach is within normal limits. Appendix is not definitely seen. No evidence of bowel wall thickening, distention, or inflammatory changes. No free air or pneumatosis. A few scattered diverticula are present along the colon without evidence of diverticulitis. Vascular/Lymphatic: Aortic atherosclerosis. A nonspecific prominent lymph node is present in the retroperitoneum on the left at the level of the left kidney measuring 1 cm. Reproductive: There is a stable pedunculated fibroid extending from the uterus on  the left. No adnexal mass. Other: No abdominopelvic ascites. Musculoskeletal: Degenerative changes are present in the thoracolumbar spine. No acute osseous abnormality. IMPRESSION: 1. Patchy infiltrates in the right lower lobe, suspicious for pneumonia. 2. No renal calculus or hydronephrosis. 3. Diverticulosis without diverticulitis. 4. Aortic atherosclerosis. Electronically Signed   By: Thornell Sartorius M.D.   On: 06/18/2022 22:59   DG Chest 2 View  Result Date: 06/18/2022 CLINICAL DATA:  [name] EXAM: CHEST - 2 VIEW COMPARISON:  Chest x-ray dated May 29, 2022 FINDINGS: The heart size and mediastinal contours are within normal limits. New consolidations of the upper and right lower lung. Lateral view is somewhat limited due to arm position. No evidence of pleural effusion or pneumothorax. Advanced degenerative changes of the right shoulder. IMPRESSION: New consolidations of the upper and right lower lung, concerning for infection. Recommend follow-up PA and lateral chest radiograph 6-8 weeks to ensure resolution. Electronically Signed   By: Allegra Lai M.D.   On: 06/18/2022 18:23    Microbiology: Recent Results (from the past 240 hour(s))  Culture, blood (Routine X 2) w Reflex to ID Panel     Status: None   Collection Time: 06/28/22  7:01 AM   Specimen: BLOOD RIGHT ARM  Result Value Ref Range Status   Specimen Description BLOOD RIGHT ARM  Final   Special Requests   Final    BOTTLES DRAWN AEROBIC AND ANAEROBIC Blood Culture adequate volume   Culture   Final    NO GROWTH 5 DAYS Performed at [facility], [address], [place], [place] [address]    Report Status 07/03/2022 FINAL  Final  Culture, blood (Routine X 2) w Reflex to ID Panel     Status: None   Collection Time: 06/28/22  7:03 AM   Specimen: BLOOD  Result Value Ref Range Status   Specimen Description BLOOD RIGHT ANTECUBITAL  Final   Special Requests   Final    BOTTLES DRAWN AEROBIC ONLY Blood Culture results  may not be optimal due to an inadequate volume of blood received in culture bottles   Culture   Final    NO GROWTH 5 DAYS Performed at [facility], [address], [place], [place] [address]    Report Status 07/03/2022 FINAL  Final     Labs: Basic Metabolic Panel: Recent Labs  Lab 06/30/22 0501 07/01/22 1107 07/02/22 0227 07/03/22 0652 07/04/22 0823 07/05/22 0309  NA 126* 127* 134* 134* 134* 130*  K 3.7 3.8 3.4* 3.3* 3.3* 3.6  CL 94* 93* 96* 98 99 97*  CO2 20* 22 23 26 25 24   GLUCOSE 136* 412* 335* 122* 130* 160*  BUN 33* 37* 42* 23* 32* 36*  CREATININE 4.90* 4.35* 4.85* 3.18* 3.95* 4.02*  CALCIUM 8.0* 7.9* 8.3* 8.0* 8.4* 8.1*  PHOS 5.2* 5.3*  --   --   --  3.8   Liver Function Tests: Recent Labs  Lab 06/30/22 0501 07/01/22 1107 07/05/22 0309  ALBUMIN 2.0* 1.8* 1.9*   No results for input(s): "LIPASE", "AMYLASE" in the last 168 hours. No results for input(s): "AMMONIA" in the last 168 hours. CBC: Recent Labs  Lab 07/01/22 1107 07/02/22 0227 07/03/22 0652 07/04/22 0823 07/05/22 0309  WBC 17.0* 16.1* 14.4* 14.6* 15.2*  HGB 6.3* 7.4* 7.4* 7.5* 7.3*  HCT 19.8* 23.0* 24.2* 23.5* 24.1*  MCV 86.5 87.8 89.3 90.0 92.0  PLT 317 338 366 376 427*   Cardiac Enzymes: No results for input(s): "CKTOTAL", "CKMB", "CKMBINDEX", "TROPONINI" in the last 168 hours. BNP: BNP (last 3 results) Recent Labs    06/02/22 0633 06/03/22 0432 06/18/22 1817  BNP 628.4* 384.8* 267.3*    ProBNP (last 3 results) No results for input(s): "PROBNP" in the last 8760 hours.  CBG: Recent Labs  Lab 07/04/22 1133 07/04/22 1733 07/04/22 2106 07/05/22 0753 07/05/22 1111  GLUCAP 164* 163* 134* 142* 170*       Signed:  Rhetta Mura MD   [facility] 07/05/2022, 12:36 PM

## 2022-07-05 NOTE — Progress Notes (Signed)
DISCHARGE NOTE HOME SYLVA OVERLEY to be discharged Home per MD order. Discussed prescriptions and follow up appointments with the patient. Prescriptions given to patient; medication list explained in detail. Patient verbalized understanding.  Skin clean, dry and intact without evidence of skin break down, no evidence of skin tears noted. IV catheter discontinued intact. Site without signs and symptoms of complications. Dressing and pressure applied. Pt denies pain at the site currently. No complaints noted.  Patient free of lines, drains, and wounds.   An After Visit Summary (AVS) was printed and given to the patient. Patient escorted via wheelchair, and discharged home via private auto.  Carmin Muskrat, RN

## 2022-07-05 NOTE — TOC Transition Note (Signed)
Transition of Care [facility]) - CM/SW Discharge Note   Patient Details  Name: Deborah [name] MRN: [id number] Date of Birth: 1965-10-20  Transition of Care [facility]) CM/SW Contact:  Tom-Johnson, Hershal Coria, RN Phone Number: 07/05/2022, 1:23 PM   Clinical Narrative:     Patient is scheduled for discharge today.  Readmission Prevention Assessment done.  Hospital f/u and discharge instructions on AVS.  Prescription sent to [facility] pharmacy and meds to be delivered to patient at bedside prior to discharge.   CM called Medicaid Transportation [phone]) and spoke with Stanton Kidney about transportation approval.  Stanton Kidney states that patient's assessment was completed last week and patient can schedule for her appointments 3 days prior to scheduled appointments.  This information is given to [name] with the name of Dialysis facility, chair time and contact number.  Order placed for Low Air loss mattress and Jason with [employer] to deliver to patient's home.  Javan informed CM that Home Health was arranged with [facility] while patient was admitted there.  PT/OT assessed/evaluated patient and recommended no f/u. Patient and [name] made aware of recommendation and [name] will contact [facility] for home visit. PTAR scheduled  for transport at discharge.  No further TOC needs noted.         Final next level of care: Home/Self Care Barriers to Discharge: Barriers Resolved   Patient Goals and CMS Choice CMS Medicare.gov Compare Post Acute Care list provided to:: Patient Choice offered to / list presented to : Patient  Discharge Placement                  Patient to be transferred to facility by: PTAR Name of family member notified: [name]- Niece    Discharge Plan and Services Additional resources added to the After Visit Summary for     Discharge Planning Services: CM Consult            DME Arranged: Specialty mattress DME Agency: [employer] Date DME Agency  Contacted: 07/01/22 Time DME Agency Contacted: 1114 Representative spoke with at DME Agency: Barbara Cower HH Arranged: NA HH Agency: NA        Social Determinants of Health (SDOH) Interventions SDOH Screenings   Food Insecurity: No Food Insecurity (06/21/2022)  Recent Concern: Food Insecurity - Food Insecurity Present (05/29/2022)  Housing: Low Risk  (06/21/2022)  Recent Concern: Housing - High Risk (05/29/2022)  Transportation Needs: No Transportation Needs (06/21/2022)  Recent Concern: Transportation Needs - Unmet Transportation Needs (06/19/2022)  Utilities: Not At Risk (06/21/2022)  Recent Concern: Utilities - At Risk (06/19/2022)  Depression (PHQ2-9): Medium Risk (09/16/2021)  Tobacco Use: Low Risk  (07/01/2022)     Readmission Risk Interventions    07/05/2022    1:20 PM  Readmission Risk Prevention Plan  Transportation Screening Complete  Medication Review (RN Care Manager) Referral to Pharmacy  PCP or Specialist appointment within 3-5 days of discharge Complete  HRI or Home Care Consult Complete  SW Recovery Care/Counseling Consult Complete  Palliative Care Screening Not Applicable  Skilled Nursing Facility Not Applicable

## 2022-07-05 NOTE — Plan of Care (Signed)
  Problem: Activity: Goal: Ability to tolerate increased activity will improve Outcome: Adequate for Discharge   

## 2022-07-05 NOTE — Progress Notes (Signed)
POST HD TX NOTE  07/05/22 1812  Vitals  Temp 99.8 F (37.7 C)  Temp Source Oral  BP (!) 153/71  MAP (mmHg) 94  BP Location Right Arm  BP Method Automatic  Patient Position (if appropriate) Lying  Pulse Rate (!) 111  Pulse Rate Source Monitor  ECG Heart Rate (!) 111  Resp 12  Oxygen Therapy  SpO2 99 %  O2 Device Room Air  Pulse Oximetry Type Continuous  During Treatment Monitoring  Intra-Hemodialysis Comments (S)   (post HD tx VS check)  Post Treatment  Dialyzer Clearance Lightly streaked  Duration of HD Treatment -hour(s) 3.5 hour(s)  Hemodialysis Intake (mL) 0 mL  Liters Processed 83.9  Fluid Removed (mL) 3000 mL  Tolerated HD Treatment Yes  Post-Hemodialysis Comments (S)  tx completed w/o problem, UF goal met, blood rinsed back, VSS. Medication Admin: Heparin 3000 units bolus at beginning of tx, Heparin 3000 units bolus mid tx, Dilaudid 4mg  po, Heparin Dwells 3800 units  Hemodialysis Catheter Right Subclavian Double lumen Permanent (Tunneled)  Placement Date/Time: 06/24/22 1644   Serial / Lot #: [id number]  Expiration Date: 09/08/26  Time Out: Correct patient;Correct site;Correct procedure  Maximum sterile barrier precautions: Hand hygiene;Mask;Cap;Sterile gloves;Sterile gown;Large sterile ...  Site Condition No complications  Blue Lumen Status Heparin locked;Dead end cap in place  Red Lumen Status Heparin locked;Dead end cap in place  Purple Lumen Status N/A  Catheter fill solution Heparin 1000 units/ml  Catheter fill volume (Arterial) 1.9 cc  Catheter fill volume (Venous) 1.9  Dressing Type Transparent  Dressing Status Antimicrobial disc in place;Clean, Dry, Intact  Drainage Description None  Dressing Change Due 07/12/22  Post treatment catheter status Capped and Clamped

## 2022-07-05 NOTE — Procedures (Signed)
I was present at this dialysis session. I have reviewed the session itself and made appropriate changes.  To be discharged after dialysis today.  To follow up at [facility] on Wednesday.   Vital signs in last 24 hours:  Temp:  [99 F (37.2 C)-99.3 F (37.4 C)] 99.2 F (37.3 C) (04/29 0825) Pulse Rate:  [91-96] 96 (04/29 0825) Resp:  [17-19] 17 (04/29 0825) BP: (138-146)/(59-66) 146/66 (04/29 0825) SpO2:  [97 %-100 %] 97 % (04/29 0825) Weight change:  Filed Weights   06/28/22 0811 06/28/22 1203 07/03/22 0546  Weight: (!) 146.4 kg (!) 144.1 kg (!) 148.2 kg    Recent Labs  Lab 07/05/22 0309  NA 130*  K 3.6  CL 97*  CO2 24  GLUCOSE 160*  BUN 36*  CREATININE 4.02*  CALCIUM 8.1*  PHOS 3.8    Recent Labs  Lab 07/03/22 0652 07/04/22 0823 07/05/22 0309  WBC 14.4* 14.6* 15.2*  HGB 7.4* 7.5* 7.3*  HCT 24.2* 23.5* 24.1*  MCV 89.3 90.0 92.0  PLT 366 376 427*    Scheduled Meds:  apixaban  5 mg Oral BID   atorvastatin  10 mg Oral Daily   carvedilol  12.5 mg Oral BID WC   Chlorhexidine Gluconate Cloth  6 each Topical Q0600   darbepoetin (ARANESP) injection - NON-DIALYSIS  100 mcg Subcutaneous Q Mon-1800   febuxostat  40 mg Oral Daily   ferrous sulfate  325 mg Oral BID WC   folic acid  1 mg Oral Daily   furosemide  80 mg Oral Daily   guaiFENesin  600 mg Oral BID   insulin aspart  0-5 Units Subcutaneous QHS   insulin aspart  0-6 Units Subcutaneous TID WC   insulin aspart  5 Units Subcutaneous TID WC   insulin glargine-yfgn  15 Units Subcutaneous BID   mirtazapine  15 mg Oral QHS   pantoprazole  40 mg Oral BID   Continuous Infusions: PRN Meds:.albuterol, guaiFENesin, heparin, heparin, HYDROmorphone, loperamide, ondansetron **OR** ondansetron (ZOFRAN) IV, mouth rinse   Irena Cords,  MD 07/05/2022, 2:37 PM

## 2022-07-06 NOTE — Progress Notes (Signed)
Late Note Entry  Pt d/c to home yesterday. Contacted [facility] this morning to advise staff of pt's d/c and that pt will start tomorrow.   Olivia Canter Renal Navigator [phone]

## 2022-07-07 ENCOUNTER — Telehealth: Payer: Self-pay | Admitting: Physician Assistant

## 2022-07-07 NOTE — Telephone Encounter (Signed)
Transition of Care - Initial Contact from Inpatient Facility  Date of discharge: 07/05/22 Date of contact: 07/07/22  Method: Phone Spoke to: Patient  Patient contacted to discuss transition of care from recent inpatient hospitalization. Patient was admitted to [facility] from 06/18/22-07/05/22 with metapneumonovirus, CKD progressed to ESRD, new HD start.   When I called the patient she reported an ambulance had just arrived and she needed to go.   I called the charge nurse at her dialysis unit who has been in contact with the patient. Patient reports she has steps to her house and is too weak to transfer to big wheel transportation. HD RN reached out to SW to see if we could possibly arrange for non-emergent ambulance transfer or ramp, etc. Patient is also working on getting her home health aide changed to MWF. HD unit following and assisting patient as much as possible. If she cannot get to HD in the mean time, will need to come back to HD for dialysis.  Rogers Blocker, PA-C 07/07/2022, 11:00 AM  [facility] Pager: [phone].

## 2022-07-08 ENCOUNTER — Encounter: Payer: 59 | Admitting: Registered Nurse

## 2022-07-08 ENCOUNTER — Telehealth: Payer: Self-pay | Admitting: Physician Assistant

## 2022-07-08 NOTE — Telephone Encounter (Signed)
-----   Message from Dara Lords, [place] sent at 06/30/2022 12:08 PM EDT ----- S/p left BC AVF 4/24.  F/u in 6 weeks on Dr. Chestine Spore clinic day with duplex.  Thanks

## 2022-07-13 ENCOUNTER — Emergency Department (HOSPITAL_COMMUNITY): Payer: 59

## 2022-07-13 ENCOUNTER — Other Ambulatory Visit: Payer: Self-pay

## 2022-07-13 ENCOUNTER — Observation Stay (HOSPITAL_COMMUNITY): Payer: 59

## 2022-07-13 ENCOUNTER — Observation Stay (HOSPITAL_COMMUNITY)
Admission: EM | Admit: 2022-07-13 | Discharge: 2022-07-14 | Disposition: A | Discharge status: Home Health | Payer: 59 | Attending: Internal Medicine | Admitting: Internal Medicine

## 2022-07-13 DIAGNOSIS — E1122 Type 2 diabetes mellitus with diabetic chronic kidney disease: Secondary | ICD-10-CM | POA: Diagnosis not present

## 2022-07-13 DIAGNOSIS — Z794 Long term (current) use of insulin: Secondary | ICD-10-CM

## 2022-07-13 DIAGNOSIS — I5033 Acute on chronic diastolic (congestive) heart failure: Secondary | ICD-10-CM | POA: Diagnosis not present

## 2022-07-13 DIAGNOSIS — E8770 Fluid overload, unspecified: Secondary | ICD-10-CM | POA: Diagnosis not present

## 2022-07-13 DIAGNOSIS — R109 Unspecified abdominal pain: Secondary | ICD-10-CM | POA: Diagnosis present

## 2022-07-13 DIAGNOSIS — R1084 Generalized abdominal pain: Secondary | ICD-10-CM | POA: Diagnosis present

## 2022-07-13 DIAGNOSIS — Z7901 Long term (current) use of anticoagulants: Secondary | ICD-10-CM | POA: Diagnosis not present

## 2022-07-13 DIAGNOSIS — Z79899 Other long term (current) drug therapy: Secondary | ICD-10-CM | POA: Diagnosis not present

## 2022-07-13 DIAGNOSIS — N179 Acute kidney failure, unspecified: Secondary | ICD-10-CM | POA: Insufficient documentation

## 2022-07-13 DIAGNOSIS — I509 Heart failure, unspecified: Secondary | ICD-10-CM

## 2022-07-13 DIAGNOSIS — E1165 Type 2 diabetes mellitus with hyperglycemia: Secondary | ICD-10-CM | POA: Insufficient documentation

## 2022-07-13 DIAGNOSIS — Z8673 Personal history of transient ischemic attack (TIA), and cerebral infarction without residual deficits: Secondary | ICD-10-CM | POA: Insufficient documentation

## 2022-07-13 DIAGNOSIS — I480 Paroxysmal atrial fibrillation: Secondary | ICD-10-CM | POA: Diagnosis not present

## 2022-07-13 DIAGNOSIS — Z85068 Personal history of other malignant neoplasm of small intestine: Secondary | ICD-10-CM | POA: Diagnosis not present

## 2022-07-13 DIAGNOSIS — I5032 Chronic diastolic (congestive) heart failure: Secondary | ICD-10-CM | POA: Insufficient documentation

## 2022-07-13 DIAGNOSIS — I132 Hypertensive heart and chronic kidney disease with heart failure and with stage 5 chronic kidney disease, or end stage renal disease: Secondary | ICD-10-CM | POA: Insufficient documentation

## 2022-07-13 DIAGNOSIS — N186 End stage renal disease: Principal | ICD-10-CM | POA: Insufficient documentation

## 2022-07-13 DIAGNOSIS — E119 Type 2 diabetes mellitus without complications: Secondary | ICD-10-CM

## 2022-07-13 DIAGNOSIS — Z992 Dependence on renal dialysis: Secondary | ICD-10-CM | POA: Insufficient documentation

## 2022-07-13 DIAGNOSIS — I10 Essential (primary) hypertension: Secondary | ICD-10-CM | POA: Diagnosis present

## 2022-07-13 DIAGNOSIS — N189 Chronic kidney disease, unspecified: Secondary | ICD-10-CM | POA: Diagnosis present

## 2022-07-13 LAB — CBC WITH DIFFERENTIAL/PLATELET
Abs Immature Granulocytes: 0.19 10*3/uL — ABNORMAL HIGH (ref 0.00–0.07)
Basophils Absolute: 0.1 10*3/uL (ref 0.0–0.1)
Basophils Relative: 1 %
Eosinophils Absolute: 0.3 10*3/uL (ref 0.0–0.5)
Eosinophils Relative: 3 %
HCT: 29.1 % — ABNORMAL LOW (ref 36.0–46.0)
Hemoglobin: 8.7 g/dL — ABNORMAL LOW (ref 12.0–15.0)
Immature Granulocytes: 2 %
Lymphocytes Relative: 15 %
Lymphs Abs: 1.8 10*3/uL (ref 0.7–4.0)
MCH: 27.7 pg (ref 26.0–34.0)
MCHC: 29.9 g/dL — ABNORMAL LOW (ref 30.0–36.0)
MCV: 92.7 fL (ref 80.0–100.0)
Monocytes Absolute: 0.8 10*3/uL (ref 0.1–1.0)
Monocytes Relative: 6 %
Neutro Abs: 8.9 10*3/uL — ABNORMAL HIGH (ref 1.7–7.7)
Neutrophils Relative %: 73 %
Platelets: 568 10*3/uL — ABNORMAL HIGH (ref 150–400)
RBC: 3.14 MIL/uL — ABNORMAL LOW (ref 3.87–5.11)
RDW: 18.6 % — ABNORMAL HIGH (ref 11.5–15.5)
WBC: 12 10*3/uL — ABNORMAL HIGH (ref 4.0–10.5)
nRBC: 0 % (ref 0.0–0.2)

## 2022-07-13 LAB — BASIC METABOLIC PANEL
Anion gap: 13 (ref 5–15)
BUN: 30 mg/dL — ABNORMAL HIGH (ref 6–20)
CO2: 24 mmol/L (ref 22–32)
Calcium: 8.4 mg/dL — ABNORMAL LOW (ref 8.9–10.3)
Chloride: 99 mmol/L (ref 98–111)
Creatinine, Ser: 5.92 mg/dL — ABNORMAL HIGH (ref 0.44–1.00)
GFR, Estimated: 8 mL/min — ABNORMAL LOW (ref >60)
Glucose, Bld: 145 mg/dL — ABNORMAL HIGH (ref 70–99)
Potassium: 3.2 mmol/L — ABNORMAL LOW (ref 3.5–5.1)
Sodium: 136 mmol/L (ref 135–145)

## 2022-07-13 LAB — CBC
HCT: 23.6 % — ABNORMAL LOW (ref 36.0–46.0)
Hemoglobin: 7.5 g/dL — ABNORMAL LOW (ref 12.0–15.0)
MCH: 28.7 pg (ref 26.0–34.0)
MCHC: 31.8 g/dL (ref 30.0–36.0)
MCV: 90.4 fL (ref 80.0–100.0)
Platelets: 486 10*3/uL — ABNORMAL HIGH (ref 150–400)
RBC: 2.61 MIL/uL — ABNORMAL LOW (ref 3.87–5.11)
RDW: 18.6 % — ABNORMAL HIGH (ref 11.5–15.5)
WBC: 13.2 10*3/uL — ABNORMAL HIGH (ref 4.0–10.5)
nRBC: 0 % (ref 0.0–0.2)

## 2022-07-13 LAB — RENAL FUNCTION PANEL
Albumin: 2.2 g/dL — ABNORMAL LOW (ref 3.5–5.0)
Anion gap: 13 (ref 5–15)
BUN: 31 mg/dL — ABNORMAL HIGH (ref 6–20)
CO2: 22 mmol/L (ref 22–32)
Calcium: 8.3 mg/dL — ABNORMAL LOW (ref 8.9–10.3)
Chloride: 101 mmol/L (ref 98–111)
Creatinine, Ser: 5.73 mg/dL — ABNORMAL HIGH (ref 0.44–1.00)
GFR, Estimated: 8 mL/min — ABNORMAL LOW (ref >60)
Glucose, Bld: 120 mg/dL — ABNORMAL HIGH (ref 70–99)
Phosphorus: 4.5 mg/dL (ref 2.5–4.6)
Potassium: 3.6 mmol/L (ref 3.5–5.1)
Sodium: 136 mmol/L (ref 135–145)

## 2022-07-13 LAB — GLUCOSE, CAPILLARY
Glucose-Capillary: 111 mg/dL — ABNORMAL HIGH (ref 70–99)
Glucose-Capillary: 158 mg/dL — ABNORMAL HIGH (ref 70–99)

## 2022-07-13 MED ORDER — ATORVASTATIN CALCIUM 10 MG PO TABS
10.0000 mg | ORAL_TABLET | Freq: Every day | ORAL | Status: DC
  Administered 2022-07-14: 10 mg via ORAL
  Filled 2022-07-13: qty 1

## 2022-07-13 MED ORDER — ALTEPLASE 2 MG IJ SOLR: 2.0000 mg | Freq: Once | INTRAMUSCULAR | Status: DC | PRN

## 2022-07-13 MED ORDER — BISACODYL 5 MG PO TBEC: 5.0000 mg | DELAYED_RELEASE_TABLET | Freq: Every day | ORAL | Status: DC | PRN

## 2022-07-13 MED ORDER — FLUTICASONE PROPIONATE 50 MCG/ACT NA SUSP: 1.0000 | Freq: Every day | NASAL | Status: DC | PRN

## 2022-07-13 MED ORDER — INSULIN ASPART 100 UNIT/ML IJ SOLN: 0.0000 [IU] | Freq: Three times a day (TID) | INTRAMUSCULAR | Status: DC

## 2022-07-13 MED ORDER — HYDROMORPHONE HCL 1 MG/ML IJ SOLN
0.5000 mg | Freq: Once | INTRAMUSCULAR | Status: AC
  Administered 2022-07-13: 0.5 mg via INTRAVENOUS
  Filled 2022-07-13: qty 1

## 2022-07-13 MED ORDER — FOLIC ACID 1 MG PO TABS
1.0000 mg | ORAL_TABLET | Freq: Every day | ORAL | Status: DC
  Administered 2022-07-14: 1 mg via ORAL
  Filled 2022-07-13 (×2): qty 1

## 2022-07-13 MED ORDER — HYDROMORPHONE HCL 2 MG PO TABS
4.0000 mg | ORAL_TABLET | Freq: Four times a day (QID) | ORAL | Status: DC | PRN
  Administered 2022-07-13 – 2022-07-14 (×2): 4 mg via ORAL
  Filled 2022-07-13 (×3): qty 2

## 2022-07-13 MED ORDER — ANTICOAGULANT SODIUM CITRATE 4% (200MG/5ML) IV SOLN
5.0000 mL | Status: DC | PRN
  Filled 2022-07-13: qty 5

## 2022-07-13 MED ORDER — MIRTAZAPINE 15 MG PO TBDP
15.0000 mg | ORAL_TABLET | Freq: Every day | ORAL | Status: DC
  Administered 2022-07-14: 15 mg via ORAL
  Filled 2022-07-13 (×2): qty 1

## 2022-07-13 MED ORDER — LIDOCAINE HCL (PF) 1 % IJ SOLN: 5.0000 mL | INTRAMUSCULAR | Status: DC | PRN

## 2022-07-13 MED ORDER — ONDANSETRON HCL 4 MG PO TABS: 4.0000 mg | ORAL_TABLET | Freq: Four times a day (QID) | ORAL | Status: DC | PRN

## 2022-07-13 MED ORDER — INSULIN ASPART 100 UNIT/ML IJ SOLN
5.0000 [IU] | Freq: Three times a day (TID) | INTRAMUSCULAR | Status: DC
  Administered 2022-07-14: 5 [IU] via SUBCUTANEOUS

## 2022-07-13 MED ORDER — FUROSEMIDE 40 MG PO TABS
80.0000 mg | ORAL_TABLET | Freq: Every day | ORAL | Status: DC
  Administered 2022-07-14: 80 mg via ORAL
  Filled 2022-07-13 (×2): qty 2

## 2022-07-13 MED ORDER — ALBUTEROL SULFATE (2.5 MG/3ML) 0.083% IN NEBU: 2.5000 mg | INHALATION_SOLUTION | Freq: Four times a day (QID) | RESPIRATORY_TRACT | Status: DC | PRN

## 2022-07-13 MED ORDER — CARVEDILOL 12.5 MG PO TABS
12.5000 mg | ORAL_TABLET | Freq: Two times a day (BID) | ORAL | Status: DC
  Administered 2022-07-13 – 2022-07-14 (×3): 12.5 mg via ORAL
  Filled 2022-07-13 (×2): qty 1

## 2022-07-13 MED ORDER — POTASSIUM CHLORIDE CRYS ER 10 MEQ PO TBCR
10.0000 meq | EXTENDED_RELEASE_TABLET | Freq: Once | ORAL | Status: AC
  Administered 2022-07-13: 10 meq via ORAL
  Filled 2022-07-13: qty 1

## 2022-07-13 MED ORDER — COLCHICINE 0.6 MG PO TABS
0.6000 mg | ORAL_TABLET | Freq: Every day | ORAL | Status: DC
  Administered 2022-07-14: 0.6 mg via ORAL
  Filled 2022-07-13: qty 1

## 2022-07-13 MED ORDER — GUAIFENESIN ER 600 MG PO TB12
600.0000 mg | ORAL_TABLET | Freq: Two times a day (BID) | ORAL | Status: DC
  Administered 2022-07-14 (×2): 600 mg via ORAL
  Filled 2022-07-13 (×2): qty 1

## 2022-07-13 MED ORDER — INSULIN GLARGINE-YFGN 100 UNIT/ML ~~LOC~~ SOLN: 15.0000 [IU] | Freq: Two times a day (BID) | SUBCUTANEOUS | Status: DC

## 2022-07-13 MED ORDER — PENTAFLUOROPROP-TETRAFLUOROETH EX AERO: 1.0000 | INHALATION_SPRAY | CUTANEOUS | Status: DC | PRN

## 2022-07-13 MED ORDER — LIDOCAINE-PRILOCAINE 2.5-2.5 % EX CREA: 1.0000 | TOPICAL_CREAM | CUTANEOUS | Status: DC | PRN

## 2022-07-13 MED ORDER — FERROUS SULFATE 325 (65 FE) MG PO TABS
325.0000 mg | ORAL_TABLET | Freq: Two times a day (BID) | ORAL | Status: DC
  Administered 2022-07-14: 325 mg via ORAL
  Filled 2022-07-13: qty 1

## 2022-07-13 MED ORDER — APIXABAN 5 MG PO TABS
5.0000 mg | ORAL_TABLET | Freq: Two times a day (BID) | ORAL | Status: DC
  Administered 2022-07-14 (×2): 5 mg via ORAL
  Filled 2022-07-13 (×2): qty 1

## 2022-07-13 MED ORDER — LOPERAMIDE HCL 2 MG PO CAPS: 2.0000 mg | ORAL_CAPSULE | Freq: Four times a day (QID) | ORAL | Status: DC | PRN

## 2022-07-13 MED ORDER — HEPARIN SODIUM (PORCINE) 1000 UNIT/ML DIALYSIS
1000.0000 [IU] | INTRAMUSCULAR | Status: DC | PRN
  Administered 2022-07-14: 3800 [IU]
  Filled 2022-07-13: qty 1

## 2022-07-13 MED ORDER — CHLORHEXIDINE GLUCONATE CLOTH 2 % EX PADS: 6.0000 | MEDICATED_PAD | Freq: Every day | CUTANEOUS | Status: DC

## 2022-07-13 MED ORDER — ONDANSETRON HCL 4 MG/2ML IJ SOLN: 4.0000 mg | Freq: Four times a day (QID) | INTRAMUSCULAR | Status: DC | PRN

## 2022-07-13 MED ORDER — FAMOTIDINE 20 MG PO TABS
20.0000 mg | ORAL_TABLET | Freq: Every day | ORAL | Status: DC
  Administered 2022-07-14: 20 mg via ORAL
  Filled 2022-07-13: qty 1

## 2022-07-13 MED ORDER — ONDANSETRON 4 MG PO TBDP: 4.0000 mg | ORAL_TABLET | Freq: Three times a day (TID) | ORAL | Status: DC | PRN

## 2022-07-13 NOTE — ED Notes (Signed)
ED TO INPATIENT HANDOFF REPORT  ED Nurse Name and Phone #: Marcello Moores [phone]  S Name/Age/Gender Deborah [name] 57 y.o. female Room/Bed: 019C/019C  Code Status   Code Status: Full Code  Home/SNF/Other Home Patient oriented to: self, place, time, and situation Is this baseline? Yes   Triage Complete: Triage complete  Chief Complaint CHF (congestive heart failure) (HCC) [I50.9]  Triage Note Pt to ED via EMS from home. Pt started dialysis last week for the first time and had only one treatment on Friday. Pt supposed to go to dialysis M,W,F. Pt missed dialysis treatment yesterday due to transportation issue. Pt c/o pain all over. Pt denies SOB. Left arm restriction. Pt also states she has a sore / blister on left foot / heel.   EMS Vitals: 110/70 102 HR Sinus 98% RA 225 CBG    Allergies Allergies  Allergen Reactions   Diazepam [name]   Gabapentin [name] and Swelling    Other reaction(s): Unknown   Iodinated Contrast Media Anaphylaxis    11/29/17 Cardiac arrest 1 min after IV contrast, possible allergy vs vasovagal episode Iopamidol  Anaphylaxis  High 11/28/2017  Patient had seizure like activity and then code post 100 cc of isovue 300     Isovue [Iopamidol] Anaphylaxis    11/28/17 Patient had seizure like activity and then 1 min code after 100 cc of isovue 300. Possible contrast allergy vs vasovagal episode   Lisinopril Anaphylaxis    Tongue and mouth swelling   Metoclopramide Other (See Comments)    Tardive dyskinesia  Also known as Reglan    Nsaids Anaphylaxis and Other (See Comments)    ULCER   Penicillins Palpitations    Has patient had a PCN reaction causing immediate rash, facial/tongue/throat swelling, SOB or lightheadedness with hypotension: Yes, heart races Has patient had a PCN reaction causing severe rash involving mucus membranes or skin necrosis: No Has patient had a PCN reaction that required hospitalization: Yes  Has  patient had a PCN reaction occurring within the last 10 years: No    Tolmetin Nausea Only and Other (See Comments)    ULCER   Dicyclomine Other (See Comments)    Chest pain   Acetaminophen Nausea Only and Other (See Comments)    Irritates stomach ulcer; Abdominal pain   Cyclobenzaprine Palpitations   Oxycodone Palpitations   Rifamycins Palpitations   Tramadol Nausea And Vomiting    Level of Care/Admitting Diagnosis ED Disposition     ED Disposition  Admit   Condition  --   Comment  Hospital Area: [facility] [[id number]]  Level of Care: Telemetry Medical [104]  May place patient in observation at [facility] or [facility] if equivalent level of care is available:: No  Covid Evaluation: Asymptomatic - no recent exposure (last 10 days) testing not required  Diagnosis: CHF (congestive heart failure) [facility]) [[id number]]  Admitting Physician: Emeline General [[id number]]  Attending Physician: Emeline General [[id number]]          B Medical/Surgery History Past Medical History:  Diagnosis Date   Acute back pain with sciatica, left    Acute back pain with sciatica, right    AKI (acute kidney injury) (HCC)    Anemia, unspecified    Cancer (HCC)    Carcinoid tumor of duodenum    Chest pain with normal coronary angiography 2019   Chronic kidney disease, stage 3b (HCC)    Chronic pain    Chronic systolic CHF (congestive  heart failure) (HCC)    Diabetes mellitus    DKA (diabetic ketoacidosis) (HCC)    Drug-seeking behavior    21 hospitalizations and 14 CT a/p in 2 years for N/V and abdominal pain, demanding only IV dilaudid   Elevated troponin    chronic   Esophageal reflux    Fibromyalgia    Gastric ulcer    Gastroparesis    Gout    Hyperlipidemia    Hypertension    Hypokalemia    Hypomagnesemia    Lumbosacral stenosis    LVH (left ventricular hypertrophy)    Morbid obesity (HCC)    NICM (nonischemic cardiomyopathy) (HCC)    PAF (paroxysmal atrial fibrillation)  (HCC)    Stroke (HCC) 02/2011   Thrombocytosis    Vitamin B12 deficiency anemia    Past Surgical History:  Procedure Laterality Date   AV FISTULA PLACEMENT Left 06/30/2022   Procedure: LEFT BRACHIOCEPHALIC ARTERIOVENOUS (AV) FISTULA CREATION;  Surgeon: Cephus Shelling, MD;  Location: [facility] OR;  Service: Vascular;  Laterality: Left;   BIOPSY  07/27/2019   Procedure: BIOPSY;  Surgeon: Vida Rigger, MD;  Location: [facility];  Service: Endoscopy;;   BIOPSY  07/30/2019   Procedure: BIOPSY;  Surgeon: Kathi Der, MD;  Location: [facility];  Service: Gastroenterology;;   CATARACT EXTRACTION  01/2014   CHOLECYSTECTOMY     COLONOSCOPY WITH PROPOFOL N/A 07/30/2019   Procedure: COLONOSCOPY WITH PROPOFOL;  Surgeon: Kathi Der, MD;  Location: [facility];  Service: Gastroenterology;  Laterality: N/A;   ESOPHAGOGASTRODUODENOSCOPY N/A 07/27/2019   Procedure: ESOPHAGOGASTRODUODENOSCOPY (EGD);  Surgeon: Vida Rigger, MD;  Location: [facility];  Service: Endoscopy;  Laterality: N/A;   ESOPHAGOGASTRODUODENOSCOPY N/A 07/26/2020   Procedure: ESOPHAGOGASTRODUODENOSCOPY (EGD);  Surgeon: Willis Modena, MD;  Location: [facility];  Service: Endoscopy;  Laterality: N/A;   ESOPHAGOGASTRODUODENOSCOPY (EGD) WITH PROPOFOL N/A 08/02/2019   Procedure: ESOPHAGOGASTRODUODENOSCOPY (EGD) WITH PROPOFOL;  Surgeon: Kathi Der, MD;  [facility]: [facility];  Service: Gastroenterology;  Laterality: N/A;   HEMOSTASIS CLIP PLACEMENT  08/02/2019   Procedure: HEMOSTASIS CLIP PLACEMENT;  Surgeon: Kathi Der, MD;  Location: [facility];  Service: Gastroenterology;;   IR FLUORO GUIDE CV LINE RIGHT  06/24/2022   IR US GUIDE VASC ACCESS RIGHT  06/24/2022   POLYPECTOMY  07/30/2019   Procedure: POLYPECTOMY;  Surgeon: Kathi Der, MD;  Location: [facility];  Service: Gastroenterology;;   POLYPECTOMY  08/02/2019   Procedure: POLYPECTOMY;  Surgeon: Kathi Der, MD;  [facility]: [facility];  Service:  Gastroenterology;;     A IV Location/Drains/Wounds Patient Lines/Drains/Airways Status     Active Line/Drains/Airways     Name Placement date Placement time Site Days   Peripheral IV 07/13/22 20 G 2.5" Anterior;Right;Upper Arm 07/13/22  1552  Arm  less than 1   Fistula / Graft Left Upper arm Arteriovenous fistula 06/30/22  1200  Upper arm  13   Hemodialysis Catheter Right Subclavian Double lumen Permanent (Tunneled) 06/24/22  1644  Subclavian  19   Wound / Incision (Open or Dehisced) 06/19/22 Skin tear Sacrum Mid stage 2 small abraison present on admission 06/19/22  1840  Sacrum  24   Wound / Incision (Open or Dehisced) 06/19/22 Heel Left 06/19/22  2100  Heel  24            Intake/Output Last 24 hours No intake or output data in the 24 hours ending 07/13/22 1638  Labs/Imaging Results for orders placed or performed during the hospital encounter of 07/13/22 (from the past 48 hour(s))  CBC with Differential     Status: Abnormal   Collection Time: 07/13/22  3:50 PM  Result Value Ref Range   WBC 12.0 (H) 4.0 - 10.5 K/uL   RBC 3.14 (L) 3.87 - 5.11 MIL/uL   Hemoglobin 8.7 (L) 12.0 - 15.0 g/dL   HCT 16.1 (L) 09.6 - 04.5 %   MCV 92.7 80.0 - 100.0 fL   MCH 27.7 26.0 - 34.0 pg   MCHC 29.9 (L) 30.0 - 36.0 g/dL   RDW 40.9 (H) 81.1 - 91.4 %   Platelets 568 (H) 150 - 400 K/uL   nRBC 0.0 0.0 - 0.2 %   Neutrophils Relative % 73 %   Neutro Abs 8.9 (H) 1.7 - 7.7 K/uL   Lymphocytes Relative 15 %   Lymphs Abs 1.8 0.7 - 4.0 K/uL   Monocytes Relative 6 %   Monocytes Absolute 0.8 0.1 - 1.0 K/uL   Eosinophils Relative 3 %   Eosinophils Absolute 0.3 0.0 - 0.5 K/uL   Basophils Relative 1 %   Basophils Absolute 0.1 0.0 - 0.1 K/uL   Immature Granulocytes 2 %   Abs Immature Granulocytes 0.19 (H) 0.00 - 0.07 K/uL    Comment: Performed at [facility], [address], [place], [place] [address]   No results found.  Pending Labs Unresulted Labs (From admission, onward)     Start      Ordered   07/13/22 1131  Basic metabolic panel  Once,   STAT        07/13/22 1130   Signed and Held  Renal function panel  Once,   R        Signed and Held   Signed and Held  CBC  Once,   R        Signed and Held   Signed and Held  Basic metabolic panel  Tomorrow morning,   R        Signed and Held   Signed and Held  CBC  Tomorrow morning,   R        Signed and Held            Vitals/Pain Today's Vitals   07/13/22 1300 07/13/22 1330 07/13/22 1600 07/13/22 1608  BP: 133/68 130/61 133/73   Pulse: 88 86 96   Resp: 15 (!) 22 15   Temp:    98.2 F (36.8 C)  TempSrc:    Oral  SpO2: 98% 94% 100%   PainSc:        Isolation Precautions No active isolations  Medications Medications  Chlorhexidine Gluconate Cloth 2 % PADS 6 each (has no administration in time range)  HYDROmorphone (DILAUDID) injection 0.5 mg (0.5 mg Intravenous Given 07/13/22 1633)    Mobility walks with person assist     Focused Assessments Renal Assessment Handoff:  Hemodialysis Schedule: Hemodialysis Schedule: Monday/Wednesday/Friday Last Hemodialysis date and time: 07/09/22   Restricted appendage: left arm   R Recommendations: See Admitting Provider Note  Report given to:   Additional Notes:

## 2022-07-13 NOTE — ED Triage Notes (Signed)
Pt to ED via EMS from home. Pt started dialysis last week for the first time and had only one treatment on Friday. Pt supposed to go to dialysis M,W,F. Pt missed dialysis treatment yesterday due to transportation issue. Pt c/o pain all over. Pt denies SOB. Left arm restriction. Pt also states she has a sore / blister on left foot / heel.   EMS Vitals: 110/70 102 HR Sinus 98% RA 225 CBG

## 2022-07-13 NOTE — Consult Note (Signed)
[facility] Renal Consultation Note    Indication for Consultation:  Management of ESRD/hemodialysis, anemia, hypertension/volume, and secondary hyperparathyroidism.  HPI: Deborah [name] is a 57 y.o. female. Recently admitted to sepsis due to metapneumovirus and new ESRD, started on dialysis MWF. PMH also includes T2DM, HTN, chronic back pain, PAD and previous stroke. She was discharged on 07/05/22. She went to dialysis on 07/09/22. However she is currently wheelchair dependent and has two sets of stairs to get out of her house. The transportation company that was set up for outpatient HD is not a [employer] so she is physically unable to get out of her house and to dialysis. At present, she denies SOB, CP, palpitations, abdominal pain, N/V/D. Reports she was dizzy early but symptoms resolved. She denies any pain at this time but did tell the ED provider that she was having diffuse abdominal pain. Denies any fever or chills. Reports she still makes a lot of urine and has not have any dysuria, hematuria, flank pain. Reports chronic LE edema but improved since hospital discharge. Has not had labs yet, reports she is a "hard stick."  Past Medical History:  Diagnosis Date   Acute back pain with sciatica, left    Acute back pain with sciatica, right    AKI (acute kidney injury) (HCC)    Anemia, unspecified    Cancer (HCC)    Carcinoid tumor of duodenum    Chest pain with normal coronary angiography 2019   Chronic kidney disease, stage 3b (HCC)    Chronic pain    Chronic systolic CHF (congestive heart failure) (HCC)    Diabetes mellitus    DKA (diabetic ketoacidosis) (HCC)    Drug-seeking behavior    21 hospitalizations and 14 CT a/p in 2 years for N/V and abdominal pain, demanding only IV dilaudid   Elevated troponin    chronic   Esophageal reflux    Fibromyalgia    Gastric ulcer    Gastroparesis    Gout    Hyperlipidemia    Hypertension    Hypokalemia     Hypomagnesemia    Lumbosacral stenosis    LVH (left ventricular hypertrophy)    Morbid obesity (HCC)    NICM (nonischemic cardiomyopathy) (HCC)    PAF (paroxysmal atrial fibrillation) (HCC)    Stroke (HCC) 02/2011   Thrombocytosis    Vitamin B12 deficiency anemia    Past Surgical History:  Procedure Laterality Date   AV FISTULA PLACEMENT Left 06/30/2022   Procedure: LEFT BRACHIOCEPHALIC ARTERIOVENOUS (AV) FISTULA CREATION;  Surgeon: Cephus Shelling, MD;  Location: [facility] OR;  Service: Vascular;  Laterality: Left;   BIOPSY  07/27/2019   Procedure: BIOPSY;  Surgeon: Vida Rigger, MD;  Location: [facility];  Service: Endoscopy;;   BIOPSY  07/30/2019   Procedure: BIOPSY;  Surgeon: Kathi Der, MD;  Location: [facility];  Service: Gastroenterology;;   CATARACT EXTRACTION  01/2014   CHOLECYSTECTOMY     COLONOSCOPY WITH PROPOFOL N/A 07/30/2019   Procedure: COLONOSCOPY WITH PROPOFOL;  Surgeon: Kathi Der, MD;  Location: [facility];  Service: Gastroenterology;  Laterality: N/A;   ESOPHAGOGASTRODUODENOSCOPY N/A 07/27/2019   Procedure: ESOPHAGOGASTRODUODENOSCOPY (EGD);  Surgeon: Vida Rigger, MD;  Location: [facility];  Service: Endoscopy;  Laterality: N/A;   ESOPHAGOGASTRODUODENOSCOPY N/A 07/26/2020   Procedure: ESOPHAGOGASTRODUODENOSCOPY (EGD);  Surgeon: Willis Modena, MD;  Location: [facility];  Service: Endoscopy;  Laterality: N/A;   ESOPHAGOGASTRODUODENOSCOPY (EGD) WITH PROPOFOL N/A 08/02/2019   Procedure: ESOPHAGOGASTRODUODENOSCOPY (EGD) WITH PROPOFOL;  Surgeon: Kathi Der, MD;  Location: [facility];  Service: Gastroenterology;  Laterality: N/A;   HEMOSTASIS CLIP PLACEMENT  08/02/2019   Procedure: HEMOSTASIS CLIP PLACEMENT;  Surgeon: Kathi Der, MD;  Location: [facility];  Service: Gastroenterology;;   IR FLUORO GUIDE CV LINE RIGHT  06/24/2022   IR US GUIDE VASC ACCESS RIGHT  06/24/2022   POLYPECTOMY  07/30/2019   Procedure: POLYPECTOMY;  Surgeon:  Kathi Der, MD;  Location: [facility];  Service: Gastroenterology;;   POLYPECTOMY  08/02/2019   Procedure: POLYPECTOMY;  Surgeon: Kathi Der, MD;  [facility]: [facility];  Service: Gastroenterology;;   Family History  Problem Relation Age of Onset   Diabetes Mother    Diabetes Father    Heart disease Father    Diabetes Sister    Congestive Heart Failure Sister 72   Diabetes Brother    Social History:  reports that she has never smoked. She has never used smokeless tobacco. She reports that she does not drink alcohol and does not use drugs.  ROS: As per HPI otherwise negative.  Physical Exam: Vitals:   07/13/22 1130 07/13/22 1230 07/13/22 1300 07/13/22 1330  BP: (!) 142/75 129/64 133/68 130/61  Pulse: 93 91 88 86  Resp: 11 14 15  (!) 22  Temp:      TempSrc:      SpO2: 100% 100% 98% 94%     General: Alert female in NAD Head: Normocephalic, atraumatic, sclera non-icteric, mucus membranes are moist. Neck: Supple without lymphadenopathy/masses. JVD not elevated. Lungs: Clear bilaterally to auscultation without wheezes, rales, or rhonchi. Breathing is unlabored. Heart: RRR with normal S1, S2. No murmurs, rubs, or gallops appreciated. Abdomen: Soft, non-distended with normoactive bowel sounds.  Musculoskeletal:  Strength and tone appear normal for age. Lower extremities: 1+ pitting edema b/l lower extremities Neuro: Alert and oriented X 3. Moves all extremities spontaneously. Psych:  Responds to questions appropriately with a normal affect. Dialysis Access: TDC, LUE AVF maturing  Allergies  Allergen Reactions   Diazepam [name]   Gabapentin [name] and Swelling    Other reaction(s): Unknown   Iodinated Contrast Media Anaphylaxis    11/29/17 Cardiac arrest 1 min after IV contrast, possible allergy vs vasovagal episode Iopamidol  Anaphylaxis  High 11/28/2017  Patient had seizure like activity and then code post 100 cc of isovue 300      Isovue [Iopamidol] Anaphylaxis    11/28/17 Patient had seizure like activity and then 1 min code after 100 cc of isovue 300. Possible contrast allergy vs vasovagal episode   Lisinopril Anaphylaxis    Tongue and mouth swelling   Metoclopramide Other (See Comments)    Tardive dyskinesia  Also known as Reglan    Nsaids Anaphylaxis and Other (See Comments)    ULCER   Penicillins Palpitations    Has patient had a PCN reaction causing immediate rash, facial/tongue/throat swelling, SOB or lightheadedness with hypotension: Yes, heart races Has patient had a PCN reaction causing severe rash involving mucus membranes or skin necrosis: No Has patient had a PCN reaction that required hospitalization: Yes  Has patient had a PCN reaction occurring within the last 10 years: No    Tolmetin Nausea Only and Other (See Comments)    ULCER   Dicyclomine Other (See Comments)    Chest pain   Acetaminophen Nausea Only and Other (See Comments)    Irritates stomach ulcer; Abdominal pain   Cyclobenzaprine Palpitations   Oxycodone Palpitations   Rifamycins Palpitations   Tramadol  Nausea And Vomiting   Prior to Admission medications   Medication Sig Start Date End Date Taking? Authorizing Provider  albuterol (PROVENTIL) (2.5 MG/3ML) 0.083% nebulizer solution Take 3 mLs (2.5 mg total) by nebulization every 6 (six) hours as needed for wheezing or shortness of breath. 04/06/19   Bing Neighbors, NP  apixaban (ELIQUIS) 5 MG TABS tablet Take 5 mg by mouth 2 (two) times daily. 12/03/21   [provider]  atorvastatin (LIPITOR) 10 MG tablet Take 10 mg by mouth at bedtime. 12/03/21   [provider]  carvedilol (COREG) 12.5 MG tablet Take 1 tablet (12.5 mg total) by mouth 2 (two) times daily with a meal. 06/04/22   Leroy Sea, MD  colchicine 0.6 MG tablet Take 0.5 tablets (0.3 mg total) by mouth daily. 06/04/22   Leroy Sea, MD  diclofenac Sodium (VOLTAREN) 1 % GEL Apply 4 g topically 4  (four) times daily. Patient not taking: Reported on 06/19/2022 05/27/22   Kathlen Mody, MD  EASY COMFORT PEN NEEDLES 31G X 5 MM MISC USE 3 TIMES A DAY FOR INSULIN ADMINISTRATION 11/14/19   Meccariello, Solmon Ice, MD  famotidine (PEPCID) 20 MG tablet Take 1 tablet (20 mg total) by mouth daily. 03/22/22 07/20/22  Cora Collum, DO  ferrous sulfate 325 (65 FE) MG tablet Take 1 tablet (325 mg total) by mouth 2 (two) times daily with a meal. 05/27/22   Kathlen Mody, MD  fluticasone (FLONASE) 50 MCG/ACT nasal spray Place 2 sprays into both nostrils daily as needed for allergies or rhinitis. Patient taking differently: Place 1 spray into both nostrils daily as needed for allergies. 12/19/18   Rai, Delene Ruffini, MD  folic acid (FOLVITE) 1 MG tablet Take 1 mg by mouth daily. 04/07/21   [provider]  furosemide (LASIX) 80 MG tablet Take 1 tablet (80 mg total) by mouth daily. 07/06/22   Rhetta Mura, MD  guaiFENesin (MUCINEX) 600 MG 12 hr tablet Take 1 tablet (600 mg total) by mouth 2 (two) times daily. 07/05/22   Rhetta Mura, MD  insulin aspart (NOVOLOG) 100 UNIT/ML injection Inject 5 Units into the skin 3 (three) times daily with meals. 06/04/22   Leroy Sea, MD  insulin glargine-yfgn (SEMGLEE) 100 UNIT/ML injection Inject 0.15 mLs (15 Units total) into the skin 2 (two) times daily. 07/05/22   Rhetta Mura, MD  insulin lispro (HUMALOG) 100 UNIT/ML KwikPen Before each meal 3 times a day, 140-199 - 2 units, 200-250 - 6 units, 251-299 - 8 units,  300-349 - 12 units,  350 or above 14 units. Patient taking differently: Inject 2-14 Units into the skin See admin instructions. Before each meal and at bedtime. 140-199 - 2 units, 200-250 - 6 units, 251-299 - 8 units,  300-349 - 12 units,  350 or above 14 units. 06/04/22   Leroy Sea, MD  loperamide (IMODIUM A-D) 2 MG tablet Take 2 mg by mouth every 6 (six) hours as needed for diarrhea or loose stools.    [provider]   mirtazapine (REMERON SOL-TAB) 15 MG disintegrating tablet Take 1 tablet (15 mg total) by mouth at bedtime. 07/05/22   Rhetta Mura, MD  Multiple Vitamin (MULTIVITAMIN WITH MINERALS) TABS tablet Take 1 tablet by mouth daily. 05/28/22   Kathlen Mody, MD  ondansetron (ZOFRAN ODT) 4 MG disintegrating tablet Take 1 tablet (4 mg total) by mouth every 8 (eight) hours as needed for nausea or vomiting. 05/30/21   [name], MD  pantoprazole (PROTONIX) 40 MG tablet TAKE 1 TABLET BY MOUTH 2 (TWO) TIMES DAILY. (AM+BEDTIME) Patient not taking: Reported on 06/19/2022 03/11/22   Raulkar, Drema Pry, MD  Vitamin D, Ergocalciferol, (DRISDOL) 1.25 MG (50000 UNIT) CAPS capsule Take 50,000 Units by mouth once a week. 04/07/21   [provider]   No current facility-administered medications for this encounter.   Current Outpatient Medications  Medication Sig Dispense Refill   albuterol (PROVENTIL) (2.5 MG/3ML) 0.083% nebulizer solution Take 3 mLs (2.5 mg total) by nebulization every 6 (six) hours as needed for wheezing or shortness of breath. 150 mL 1   apixaban (ELIQUIS) 5 MG TABS tablet Take 5 mg by mouth 2 (two) times daily.     atorvastatin (LIPITOR) 10 MG tablet Take 10 mg by mouth at bedtime.     carvedilol (COREG) 12.5 MG tablet Take 1 tablet (12.5 mg total) by mouth 2 (two) times daily with a meal.     colchicine 0.6 MG tablet Take 0.5 tablets (0.3 mg total) by mouth daily.     diclofenac Sodium (VOLTAREN) 1 % GEL Apply 4 g topically 4 (four) times daily. (Patient not taking: Reported on 06/19/2022)     EASY COMFORT PEN NEEDLES 31G X 5 MM MISC USE 3 TIMES A DAY FOR INSULIN ADMINISTRATION 100 each 3   famotidine (PEPCID) 20 MG tablet Take 1 tablet (20 mg total) by mouth daily. 30 tablet 3   ferrous sulfate 325 (65 FE) MG tablet Take 1 tablet (325 mg total) by mouth 2 (two) times daily with a meal.  3   fluticasone (FLONASE) 50 MCG/ACT nasal spray Place 2 sprays into both nostrils daily as  needed for allergies or rhinitis. (Patient taking differently: Place 1 spray into both nostrils daily as needed for allergies.) 16 g 2   folic acid (FOLVITE) 1 MG tablet Take 1 mg by mouth daily.     furosemide (LASIX) 80 MG tablet Take 1 tablet (80 mg total) by mouth daily. 30 tablet 0   guaiFENesin (MUCINEX) 600 MG 12 hr tablet Take 1 tablet (600 mg total) by mouth 2 (two) times daily. 20 tablet 0   insulin aspart (NOVOLOG) 100 UNIT/ML injection Inject 5 Units into the skin 3 (three) times daily with meals. 10 mL 0   insulin glargine-yfgn (SEMGLEE) 100 UNIT/ML injection Inject 0.15 mLs (15 Units total) into the skin 2 (two) times daily. 10 mL 0   insulin lispro (HUMALOG) 100 UNIT/ML KwikPen Before each meal 3 times a day, 140-199 - 2 units, 200-250 - 6 units, 251-299 - 8 units,  300-349 - 12 units,  350 or above 14 units. (Patient taking differently: Inject 2-14 Units into the skin See admin instructions. Before each meal and at bedtime. 140-199 - 2 units, 200-250 - 6 units, 251-299 - 8 units,  300-349 - 12 units,  350 or above 14 units.) 15 mL 0   loperamide (IMODIUM A-D) 2 MG tablet Take 2 mg by mouth every 6 (six) hours as needed for diarrhea or loose stools.     mirtazapine (REMERON SOL-TAB) 15 MG disintegrating tablet Take 1 tablet (15 mg total) by mouth at bedtime. 30 tablet 0   Multiple Vitamin (MULTIVITAMIN WITH MINERALS) TABS tablet Take 1 tablet by mouth daily.     ondansetron (ZOFRAN ODT) 4 MG disintegrating tablet Take 1 tablet (4 mg total) by mouth every 8 (eight) hours as needed for nausea or vomiting. 20 tablet 0   pantoprazole (PROTONIX) 40 MG tablet  TAKE 1 TABLET BY MOUTH 2 (TWO) TIMES DAILY. (AM+BEDTIME) (Patient not taking: Reported on 06/19/2022) 60 tablet 1   Vitamin D, Ergocalciferol, (DRISDOL) 1.25 MG (50000 UNIT) CAPS capsule Take 50,000 Units by mouth once a week.     Labs: no results   Dialysis Orders:  Center: [facility]  on MWF . 180NRe 4 hours BFR 400 DFR 800 EDW 136.5kg  (left at 135.3kg last HD) 3K 2Ca TDC No heparin Mircera IV q 2 weeks- not given yet   Assessment/Plan:  ESRD:  missed dialysis due to inability to transfer out of her house, down the stairs and into the vehicle. Renal navigator is checking in with case manager to see if there is any progress with arranging appropriate transportation. Appreciate social work assistance. Will arrange for HD today, will most likely be later tonight due to high census. Using [facility], new AVF placed last admission  Hypertension/volume: BP controlled, trace edema on exam. UF with HD as tolerated.   Anemia: Last Hgb 7.9 on 07/09/22. Due for next dose of ESA, will order.   Metabolic bone disease: No labs here yet. She is not on a phosphorus binder. Follow trends.   Nutrition:  Will need renal diet/fluid restrictions T2DM: management per primary team, on insulin  Rogers Blocker, PA-C 07/13/2022, 3:21 PM  [facility] Pager: [phone]

## 2022-07-13 NOTE — ED Provider Notes (Signed)
[place] EMERGENCY DEPARTMENT AT [facility] Provider Note   [name]: [id number] Arrival date & time: 07/13/22  1109     History  Chief Complaint  Patient presents with   Missed Dialysis     Deborah [name] is a 57 y.o. female history of morbid obesity, A-fib on Eliquis, T2DM, CHF and recent ESRD on HD MWF presenting after missed dialysis with abdominal pain.  She reports she was recently started on dialysis about 2 weeks ago when she was hospitalized and discharged last Monday.  Missed her Wednesday dialysis but was dialyzed on Friday with 2L extracted.  Diffused abdominal pain which she describes as an all over her abdomen after missing dialysis yesterday.  She does have bilateral lower extremity edema which she said is slightly improved from her prior admission.  Reports mild shortness of breath and some intermittent dizziness but denies any chest pain.  Follows with Dr. Thedore Mins as her nephrologist out patient.    Home Medications Prior to Admission medications   Medication Sig Start Date End Date Taking? Authorizing Provider  albuterol (PROVENTIL) (2.5 MG/3ML) 0.083% nebulizer solution Take 3 mLs (2.5 mg total) by nebulization every 6 (six) hours as needed for wheezing or shortness of breath. 04/06/19   Bing Neighbors, NP  apixaban (ELIQUIS) 5 MG TABS tablet Take 5 mg by mouth 2 (two) times daily. 12/03/21   [provider]  atorvastatin (LIPITOR) 10 MG tablet Take 10 mg by mouth at bedtime. 12/03/21   [provider]  carvedilol (COREG) 12.5 MG tablet Take 1 tablet (12.5 mg total) by mouth 2 (two) times daily with a meal. 06/04/22   Leroy Sea, MD  colchicine 0.6 MG tablet Take 0.5 tablets (0.3 mg total) by mouth daily. 06/04/22   Leroy Sea, MD  diclofenac Sodium (VOLTAREN) 1 % GEL Apply 4 g topically 4 (four) times daily. Patient not taking: Reported on 06/19/2022 05/27/22   Kathlen Mody, MD  EASY COMFORT PEN NEEDLES 31G X 5 MM MISC USE 3  TIMES A DAY FOR INSULIN ADMINISTRATION 11/14/19   Meccariello, Solmon Ice, MD  famotidine (PEPCID) 20 MG tablet Take 1 tablet (20 mg total) by mouth daily. 03/22/22 07/20/22  Cora Collum, DO  ferrous sulfate 325 (65 FE) MG tablet Take 1 tablet (325 mg total) by mouth 2 (two) times daily with a meal. 05/27/22   Kathlen Mody, MD  fluticasone (FLONASE) 50 MCG/ACT nasal spray Place 2 sprays into both nostrils daily as needed for allergies or rhinitis. Patient taking differently: Place 1 spray into both nostrils daily as needed for allergies. 12/19/18   Rai, Delene Ruffini, MD  folic acid (FOLVITE) 1 MG tablet Take 1 mg by mouth daily. 04/07/21   [provider]  furosemide (LASIX) 80 MG tablet Take 1 tablet (80 mg total) by mouth daily. 07/06/22   Rhetta Mura, MD  guaiFENesin (MUCINEX) 600 MG 12 hr tablet Take 1 tablet (600 mg total) by mouth 2 (two) times daily. 07/05/22   Rhetta Mura, MD  insulin aspart (NOVOLOG) 100 UNIT/ML injection Inject 5 Units into the skin 3 (three) times daily with meals. 06/04/22   Leroy Sea, MD  insulin glargine-yfgn (SEMGLEE) 100 UNIT/ML injection Inject 0.15 mLs (15 Units total) into the skin 2 (two) times daily. 07/05/22   Rhetta Mura, MD  insulin lispro (HUMALOG) 100 UNIT/ML KwikPen Before each meal 3 times a day, 140-199 - 2 units, 200-250 - 6 units, 251-299 - 8 units,  300-349 - 12 units,  350 or above 14 units. Patient taking differently: Inject 2-14 Units into the skin See admin instructions. Before each meal and at bedtime. 140-199 - 2 units, 200-250 - 6 units, 251-299 - 8 units,  300-349 - 12 units,  350 or above 14 units. 06/04/22   Leroy Sea, MD  loperamide (IMODIUM A-D) 2 MG tablet Take 2 mg by mouth every 6 (six) hours as needed for diarrhea or loose stools.    [provider]  mirtazapine (REMERON SOL-TAB) 15 MG disintegrating tablet Take 1 tablet (15 mg total) by mouth at bedtime. 07/05/22   Rhetta Mura,  MD  Multiple Vitamin (MULTIVITAMIN WITH MINERALS) TABS tablet Take 1 tablet by mouth daily. 05/28/22   Kathlen Mody, MD  ondansetron (ZOFRAN ODT) 4 MG disintegrating tablet Take 1 tablet (4 mg total) by mouth every 8 (eight) hours as needed for nausea or vomiting. 05/30/21   [name], MD  pantoprazole (PROTONIX) 40 MG tablet TAKE 1 TABLET BY MOUTH 2 (TWO) TIMES DAILY. (AM+BEDTIME) Patient not taking: Reported on 06/19/2022 03/11/22   Horton Chin, MD  Vitamin D, Ergocalciferol, (DRISDOL) 1.25 MG (50000 UNIT) CAPS capsule Take 50,000 Units by mouth once a week. 04/07/21   [provider]      Allergies    Diazepam, Gabapentin, Iodinated contrast media, Isovue [iopamidol], Lisinopril, Metoclopramide, Nsaids, Penicillins, Tolmetin, Dicyclomine, Acetaminophen, Cyclobenzaprine, Oxycodone, Rifamycins, and Tramadol    Review of Systems   Review of Systems  Constitutional:  Positive for fatigue. Negative for chills, diaphoresis and fever.  Respiratory:  Positive for shortness of breath. Negative for chest tightness, wheezing and stridor.   Cardiovascular:  Positive for leg swelling. Negative for chest pain.  Gastrointestinal:  Positive for abdominal distention and abdominal pain. Negative for blood in stool, constipation, diarrhea, nausea and vomiting.  Genitourinary: Negative.   Skin:  Positive for wound (left sole. non draining). Negative for rash.  Neurological:  Negative for syncope, light-headedness and headaches.  Psychiatric/Behavioral: Negative.      Physical Exam Updated Vital Signs BP 130/61   Pulse 86   Temp (!) 97.3 F (36.3 C) (Oral)   Resp (!) 22   LMP 10/10/2012   SpO2 94%  Physical Exam Constitutional:      Appearance: Normal appearance. She is obese.  HENT:     Mouth/Throat:     Mouth: Mucous membranes are moist.     Pharynx: Oropharynx is clear.  Eyes:     Extraocular Movements: Extraocular movements intact.     Conjunctiva/sclera: Conjunctivae  normal.     Pupils: Pupils are equal, round, and reactive to light.  Cardiovascular:     Rate and Rhythm: Normal rate and regular rhythm.     Pulses: Normal pulses.     Heart sounds: Normal heart sounds.  Pulmonary:     Effort: Pulmonary effort is normal.     Breath sounds: Normal breath sounds.  Abdominal:     General: Abdomen is flat. Bowel sounds are normal.     Palpations: Abdomen is soft.  Musculoskeletal:        General: Normal range of motion.     Cervical back: Normal range of motion and neck supple.     Right lower leg: 1+ Pitting Edema present.     Left lower leg: 1+ Pitting Edema present.  Skin:    General: Skin is warm and dry.     Capillary Refill: Capillary refill takes less than 2 seconds.  Neurological:     General: No focal deficit present.     Mental Status: She is alert and oriented to person, place, and time. Mental status is at baseline.  Psychiatric:        Mood and Affect: Mood normal.        Behavior: Behavior normal.        Thought Content: Thought content normal.     ED Results / Procedures / Treatments   Labs (all labs ordered are listed, but only abnormal results are displayed) Labs Reviewed  BASIC METABOLIC PANEL  CBC WITH DIFFERENTIAL/PLATELET    EKG EKG Interpretation  Date/Time:  Tuesday Jul 13 2022 11:25:49 EDT Ventricular Rate:  97 PR Interval:  206 QRS Duration: 95 QT Interval:  387 QTC Calculation: 492 R Axis:   -54 Text Interpretation: Sinus rhythm Prolonged PR interval Left anterior fascicular block Borderline prolonged QT interval No significant change since last tracing Confirmed by Linwood Dibbles [phone]) on 07/13/2022 3:24:40 PM  Radiology No results found.  Procedures Procedures   Medications Ordered in ED Medications  Chlorhexidine Gluconate Cloth 2 % PADS 6 each (has no administration in time range)    ED Course/ Medical Decision Making/ A&P    Medical Decision Making Amount and/or Complexity of Data  Reviewed Labs: ordered.   12:22 pm- Spoked to Dr. Juel Burrow nephrology about patient who recommend urine BMP and pending electrolyte level if within normal limits can discharge with plans for outpatient dialysis tomorrow (Wednesday) but will admit for HD if electrolyte is abnormal.   3:21pm- Updated by Rogers Blocker PA with nephrology that patient will likely need dialysis inpatient dialysis and given difficulty with transportation to outpatient dialysis will need admission for same disposition as the logistics to ensure continued outpatient dialysis is sorted out with the case manager.  3:35pm: Admit to unassigned order placed.   3:45 pm: Updated by patient nurse they are having difficulty drawing lab and will put in consult for IV team.  4:08: Discussed patient with internist Dr. Chipper Herb who will be admitting patient.    57 year old female presenting with acute abdominal pain after missing dialysis.  Appears to have anasarca likely due to missed dialysis. Unfortunately transportation issues this may need access to dialysis difficult and so far patient has missed 2 sessions.  In discussions with nephrology and is currently scheduled for late dialysis tonight. Labs are still pending, Will need admission given late dialysis tonight and would need logistic for outpatient transportation for dialysis sorted out.   Final Clinical Impression(s) / ED Diagnoses Final diagnoses:  None    Rx / DC Orders ED Discharge Orders     None         Jerre Simon, MD 07/13/22 1613    Lowther, Amy, DO 07/20/22 1733

## 2022-07-13 NOTE — H&P (Addendum)
History and Physical    [name]:[id number] DOB: 1965/11/24 DOA: 07/13/2022  PCP: Elsie Amis, MD (Confirm with patient/family/NH records and if not entered, this has to be entered at [facility] point of entry) Patient coming from: Home  I have personally briefly reviewed patient's old medical records in [facility]  Chief Complaint: Belly hurts  HPI: Deborah [name] is a 57 y.o. female with medical history significant of CKD stage IV with recently started HD via right chest catheter, HTN, IDDM, diabetic neuropathy, diabetic gastroparesis, HTN, PAF on Eliquis, chronic HFpEF chronic back pain secondary to spinal stenosis, duodenum carcinoma status post resection in 2021 with chronic abdominal pain, narcotic dependence, morbid obesity, chronic ambulation dysfunction wheelchair-bound, presented with missed dialysis, worsening of abdominal pain.  Patient was started on hemodialysis 2 weeks ago, with [facility] M[facility], came with multiple complaints including worsening of abdominal pain, and missed dialysis.  Last week, patient for dialysis for the first time on Wednesday due to transportation issue. She managed to go to hemodialysis on Friday without issue.  She missed her Monday dialysis due to the same issue of transportation company could not accommodate her wheelchair with the vehicle.  She started to feel shortness of breath overnight and developed worsening of her chronic abdominal pain sharp-like pain, described as 10/10, nonradiating associated with nausea.  Had 1 loose bowel movement this morning and denies any tenesmus.  ED Course: Afebrile, none tachycardia none hypotension not hypoxic.  Blood work showed K3.2, creatinine 5.9 BUN 30 bicarb 24, WBC 12 hemoglobin 8.7 platelet 568.  Chest x-ray showed pulmonary vasculature congestion.  Review of Systems: As per HPI otherwise 14 point review of systems negative.    Past Medical History:  Diagnosis Date   Acute  back pain with sciatica, left    Acute back pain with sciatica, right    AKI (acute kidney injury) (HCC)    Anemia, unspecified    Cancer (HCC)    Carcinoid tumor of duodenum    Chest pain with normal coronary angiography 2019   Chronic kidney disease, stage 3b (HCC)    Chronic pain    Chronic systolic CHF (congestive heart failure) (HCC)    Diabetes mellitus    DKA (diabetic ketoacidosis) (HCC)    Drug-seeking behavior    21 hospitalizations and 14 CT a/p in 2 years for N/V and abdominal pain, demanding only IV dilaudid   Elevated troponin    chronic   Esophageal reflux    Fibromyalgia    Gastric ulcer    Gastroparesis    Gout    Hyperlipidemia    Hypertension    Hypokalemia    Hypomagnesemia    Lumbosacral stenosis    LVH (left ventricular hypertrophy)    Morbid obesity (HCC)    NICM (nonischemic cardiomyopathy) (HCC)    PAF (paroxysmal atrial fibrillation) (HCC)    Stroke (HCC) 02/2011   Thrombocytosis    Vitamin B12 deficiency anemia     Past Surgical History:  Procedure Laterality Date   AV FISTULA PLACEMENT Left 06/30/2022   Procedure: LEFT BRACHIOCEPHALIC ARTERIOVENOUS (AV) FISTULA CREATION;  Surgeon: Cephus Shelling, MD;  Location: [facility] OR;  Service: Vascular;  Laterality: Left;   BIOPSY  07/27/2019   Procedure: BIOPSY;  Surgeon: Vida Rigger, MD;  Location: [facility] ENDOSCOPY;  Service: Endoscopy;;   BIOPSY  07/30/2019   Procedure: BIOPSY;  Surgeon: Kathi Der, MD;  Location: [facility] ENDOSCOPY;  Service: Gastroenterology;;   CATARACT EXTRACTION  01/2014  CHOLECYSTECTOMY     COLONOSCOPY WITH PROPOFOL N/A 07/30/2019   Procedure: COLONOSCOPY WITH PROPOFOL;  Surgeon: Kathi Der, MD;  Location: [facility]OSCOPY;  Service: Gastroenterology;  Laterality: N/A;   ESOPHAGOGASTRODUODENOSCOPY N/A 07/27/2019   Procedure: ESOPHAGOGASTRODUODENOSCOPY (EGD);  Surgeon: Vida Rigger, MD;  Location: [facility];  Service: Endoscopy;  Laterality: N/A;   ESOPHAGOGASTRODUODENOSCOPY  N/A 07/26/2020   Procedure: ESOPHAGOGASTRODUODENOSCOPY (EGD);  Surgeon: Willis Modena, MD;  Location: [facility];  Service: Endoscopy;  Laterality: N/A;   ESOPHAGOGASTRODUODENOSCOPY (EGD) WITH PROPOFOL N/A 08/02/2019   Procedure: ESOPHAGOGASTRODUODENOSCOPY (EGD) WITH PROPOFOL;  Surgeon: Kathi Der, MD;  Location: [facility];  Service: Gastroenterology;  Laterality: N/A;   HEMOSTASIS CLIP PLACEMENT  08/02/2019   Procedure: HEMOSTASIS CLIP PLACEMENT;  Surgeon: Kathi Der, MD;  Location: [facility];  Service: Gastroenterology;;   IR FLUORO GUIDE CV LINE RIGHT  06/24/2022   IR US GUIDE VASC ACCESS RIGHT  06/24/2022   POLYPECTOMY  07/30/2019   Procedure: POLYPECTOMY;  Surgeon: Kathi Der, MD;  Location: [facility];  Service: Gastroenterology;;   POLYPECTOMY  08/02/2019   Procedure: POLYPECTOMY;  Surgeon: Kathi Der, MD;  Location: [facility];  Service: Gastroenterology;;     reports that she has never smoked. She has never used smokeless tobacco. She reports that she does not drink alcohol and does not use drugs.  Allergies  Allergen Reactions   Diazepam [name]   Gabapentin [name] and Swelling    Other reaction(s): Unknown   Iodinated Contrast Media Anaphylaxis    11/29/17 Cardiac arrest 1 min after IV contrast, possible allergy vs vasovagal episode Iopamidol  Anaphylaxis  High 11/28/2017  Patient had seizure like activity and then code post 100 cc of isovue 300     Isovue [Iopamidol] Anaphylaxis    11/28/17 Patient had seizure like activity and then 1 min code after 100 cc of isovue 300. Possible contrast allergy vs vasovagal episode   Lisinopril Anaphylaxis    Tongue and mouth swelling   Metoclopramide Other (See Comments)    Tardive dyskinesia  Also known as Reglan    Nsaids Anaphylaxis and Other (See Comments)    ULCER   Penicillins Palpitations    Has patient had a PCN reaction causing immediate rash, facial/tongue/throat  swelling, SOB or lightheadedness with hypotension: Yes, heart races Has patient had a PCN reaction causing severe rash involving mucus membranes or skin necrosis: No Has patient had a PCN reaction that required hospitalization: Yes  Has patient had a PCN reaction occurring within the last 10 years: No    Tolmetin Nausea Only and Other (See Comments)    ULCER   Dicyclomine Other (See Comments)    Chest pain   Acetaminophen Nausea Only and Other (See Comments)    Irritates stomach ulcer; Abdominal pain   Cyclobenzaprine Palpitations   Oxycodone Palpitations   Rifamycins Palpitations   Tramadol Nausea And Vomiting    Family History  Problem Relation Age of Onset   Diabetes Mother    Diabetes Father    Heart disease Father    Diabetes Sister    Congestive Heart Failure Sister 55   Diabetes Brother      Prior to Admission medications   Medication Sig Start Date End Date Taking? Authorizing Provider  albuterol (PROVENTIL) (2.5 MG/3ML) 0.083% nebulizer solution Take 3 mLs (2.5 mg total) by nebulization every 6 (six) hours as needed for wheezing or shortness of breath. 04/06/19   Bing Neighbors, NP  apixaban (ELIQUIS) 5 MG TABS  tablet Take 5 mg by mouth 2 (two) times daily. 12/03/21   [provider]  atorvastatin (LIPITOR) 10 MG tablet Take 10 mg by mouth at bedtime. 12/03/21   [provider]  carvedilol (COREG) 12.5 MG tablet Take 1 tablet (12.5 mg total) by mouth 2 (two) times daily with a meal. 06/04/22   Leroy Sea, MD  colchicine 0.6 MG tablet Take 0.5 tablets (0.3 mg total) by mouth daily. 06/04/22   Leroy Sea, MD  diclofenac Sodium (VOLTAREN) 1 % GEL Apply 4 g topically 4 (four) times daily. Patient not taking: Reported on 06/19/2022 05/27/22   Kathlen Mody, MD  EASY COMFORT PEN NEEDLES 31G X 5 MM MISC USE 3 TIMES A DAY FOR INSULIN ADMINISTRATION 11/14/19   Meccariello, Solmon Ice, MD  famotidine (PEPCID) 20 MG tablet Take 1 tablet (20 mg total) by  mouth daily. 03/22/22 07/20/22  Cora Collum, DO  ferrous sulfate 325 (65 FE) MG tablet Take 1 tablet (325 mg total) by mouth 2 (two) times daily with a meal. 05/27/22   Kathlen Mody, MD  fluticasone (FLONASE) 50 MCG/ACT nasal spray Place 2 sprays into both nostrils daily as needed for allergies or rhinitis. Patient taking differently: Place 1 spray into both nostrils daily as needed for allergies. 12/19/18   Rai, Delene Ruffini, MD  folic acid (FOLVITE) 1 MG tablet Take 1 mg by mouth daily. 04/07/21   [provider]  furosemide (LASIX) 80 MG tablet Take 1 tablet (80 mg total) by mouth daily. 07/06/22   Rhetta Mura, MD  guaiFENesin (MUCINEX) 600 MG 12 hr tablet Take 1 tablet (600 mg total) by mouth 2 (two) times daily. 07/05/22   Rhetta Mura, MD  insulin aspart (NOVOLOG) 100 UNIT/ML injection Inject 5 Units into the skin 3 (three) times daily with meals. 06/04/22   Leroy Sea, MD  insulin glargine-yfgn (SEMGLEE) 100 UNIT/ML injection Inject 0.15 mLs (15 Units total) into the skin 2 (two) times daily. 07/05/22   Rhetta Mura, MD  insulin lispro (HUMALOG) 100 UNIT/ML KwikPen Before each meal 3 times a day, 140-199 - 2 units, 200-250 - 6 units, 251-299 - 8 units,  300-349 - 12 units,  350 or above 14 units. Patient taking differently: Inject 2-14 Units into the skin See admin instructions. Before each meal and at bedtime. 140-199 - 2 units, 200-250 - 6 units, 251-299 - 8 units,  300-349 - 12 units,  350 or above 14 units. 06/04/22   Leroy Sea, MD  loperamide (IMODIUM A-D) 2 MG tablet Take 2 mg by mouth every 6 (six) hours as needed for diarrhea or loose stools.    [provider]  mirtazapine (REMERON SOL-TAB) 15 MG disintegrating tablet Take 1 tablet (15 mg total) by mouth at bedtime. 07/05/22   Rhetta Mura, MD  Multiple Vitamin (MULTIVITAMIN WITH MINERALS) TABS tablet Take 1 tablet by mouth daily. 05/28/22   Kathlen Mody, MD  ondansetron  (ZOFRAN ODT) 4 MG disintegrating tablet Take 1 tablet (4 mg total) by mouth every 8 (eight) hours as needed for nausea or vomiting. 05/30/21   [name], MD  pantoprazole (PROTONIX) 40 MG tablet TAKE 1 TABLET BY MOUTH 2 (TWO) TIMES DAILY. (AM+BEDTIME) Patient not taking: Reported on 06/19/2022 03/11/22   Horton Chin, MD  Vitamin D, Ergocalciferol, (DRISDOL) 1.25 MG (50000 UNIT) CAPS capsule Take 50,000 Units by mouth once a week. 04/07/21   [provider]    Physical Exam: Vitals:  07/13/22 1300 07/13/22 1330 07/13/22 1600 07/13/22 1608  BP: 133/68 130/61 133/73   Pulse: 88 86 96   Resp: 15 (!) 22 15   Temp:    98.2 F (36.8 C)  TempSrc:    Oral  SpO2: 98% 94% 100%     Constitutional: NAD, calm, comfortable Vitals:   07/13/22 1300 07/13/22 1330 07/13/22 1600 07/13/22 1608  BP: 133/68 130/61 133/73   Pulse: 88 86 96   Resp: 15 (!) 22 15   Temp:    98.2 F (36.8 C)  TempSrc:    Oral  SpO2: 98% 94% 100%    Eyes: PERRL, lids and conjunctivae normal ENMT: Mucous membranes are moist. Posterior pharynx clear of any exudate or lesions.Normal dentition.  Neck: normal, supple, no masses, no thyromegaly Respiratory: clear to auscultation bilaterally, no wheezing, scattered crackles bilaterally, increasing breathing effort. No accessory muscle use.  Cardiovascular: Regular rate and rhythm, no murmurs / rubs / gallops. No extremity edema. 2+ pedal pulses. No carotid bruits.  Abdomen: Mild tenderness on epigastric area, no rebound no guarding, no masses palpated. No hepatosplenomegaly. Bowel sounds on all 4 quadrants.  Musculoskeletal: no clubbing / cyanosis. No joint deformity upper and lower extremities. Good ROM, no contractures. Normal muscle tone.  Skin: no rashes, lesions, ulcers. No induration Neurologic: CN 2-12 grossly intact. Sensation intact, DTR normal. Strength 5/5 in all 4.  Psychiatric: Normal judgment and insight. Alert and oriented x 3. Normal mood.     Labs on Admission: I have personally reviewed following labs and imaging studies  CBC: Recent Labs  Lab 07/13/22 1550  WBC 12.0*  NEUTROABS 8.9*  HGB 8.7*  HCT 29.1*  MCV 92.7  PLT 568*   Basic Metabolic Panel: No results for input(s): "NA", "K", "CL", "CO2", "GLUCOSE", "BUN", "CREATININE", "CALCIUM", "MG", "PHOS" in the last 168 hours. GFR: Estimated Creatinine Clearance: 21.8 mL/min (A) (by C-G formula based on SCr of 4.02 mg/dL (H)). Liver Function Tests: No results for input(s): "AST", "ALT", "ALKPHOS", "BILITOT", "PROT", "ALBUMIN" in the last 168 hours. No results for input(s): "LIPASE", "AMYLASE" in the last 168 hours. No results for input(s): "AMMONIA" in the last 168 hours. Coagulation Profile: No results for input(s): "INR", "PROTIME" in the last 168 hours. Cardiac Enzymes: No results for input(s): "CKTOTAL", "CKMB", "CKMBINDEX", "TROPONINI" in the last 168 hours. BNP (last 3 results) No results for input(s): "PROBNP" in the last 8760 hours. HbA1C: No results for input(s): "HGBA1C" in the last 72 hours. CBG: No results for input(s): "GLUCAP" in the last 168 hours. Lipid Profile: No results for input(s): "CHOL", "HDL", "LDLCALC", "TRIG", "CHOLHDL", "LDLDIRECT" in the last 72 hours. Thyroid Function Tests: No results for input(s): "TSH", "T4TOTAL", "FREET4", "T3FREE", "THYROIDAB" in the last 72 hours. Anemia Panel: No results for input(s): "VITAMINB12", "FOLATE", "FERRITIN", "TIBC", "IRON", "RETICCTPCT" in the last 72 hours. Urine analysis:    Component Value Date/Time   COLORURINE STRAW (A) 06/19/2022 0638   APPEARANCEUR HAZY (A) 06/19/2022 0638   LABSPEC 1.010 06/19/2022 0638   PHURINE 5.0 06/19/2022 0638   GLUCOSEU NEGATIVE 06/19/2022 0638   HGBUR SMALL (A) 06/19/2022 0638   BILIRUBINUR NEGATIVE 06/19/2022 0638   KETONESUR NEGATIVE 06/19/2022 0638   PROTEINUR 30 (A) 06/19/2022 0638   UROBILINOGEN 0.2 10/02/2013 2108   NITRITE NEGATIVE 06/19/2022 0638    LEUKOCYTESUR LARGE (A) 06/19/2022 1610    Radiological Exams on Admission: No results found.  EKG: Independently reviewed.  Sinus rhythm, no acute ST changes.  Assessment/Plan Principal Problem:  CHF (congestive heart failure) (HCC) Active Problems:   Type 2 diabetes mellitus with hyperglycemia, with long-term current use of insulin (HCC)   Essential hypertension, benign   Abdominal pain   Acute on chronic kidney failure (HCC)  (please populate well all problems here in Problem List. (For example, if patient is on BP meds at home and you resume or decide to hold them, it is a problem that needs to be her. Same for CAD, COPD, HLD and so on)   Acute on chronic HFpEF decompensation -Secondary to noncoherent with HD schedule due to transportation issue. -Nephrology consultation appreciated, emergency HD this evening -Continue p.o. Lasix  Acute on chronic abdominal pain -Clinically suspect worsening of her gastroparesis versus acute gastritis on top of chronic abdominal abdominal pain -Check abdominal x-ray rule out SBO  IDDM -Thin sliding scale -Hold off Lantus given worsening of kidney function  PAF -In sinus rhythm, continue Eliquis  HTN -Continue Norvasc Coreg and hydralazine  Gout -Continue colchicine and allopurinol  Chronic pain syndrome and narcotic dependence -Continue elevated regimen  Morbid obesity with chronic ambulation dysfunction and wheelchair-bound -At baseline  DVT prophylaxis: Eliquis Code Status: Full code Family Communication: None at bedside Disposition Plan: Expect less than 2 midnight hospital stay Consults called: Nephrology Admission status: Telemetry observation   Emeline General MD [facility] Pager [phone]  07/13/2022, 4:32 PM

## 2022-07-14 DIAGNOSIS — N186 End stage renal disease: Secondary | ICD-10-CM | POA: Diagnosis not present

## 2022-07-14 DIAGNOSIS — I5033 Acute on chronic diastolic (congestive) heart failure: Secondary | ICD-10-CM | POA: Diagnosis not present

## 2022-07-14 LAB — CBC
HCT: 23 % — ABNORMAL LOW (ref 36.0–46.0)
Hemoglobin: 7.3 g/dL — ABNORMAL LOW (ref 12.0–15.0)
MCH: 28.3 pg (ref 26.0–34.0)
MCHC: 31.7 g/dL (ref 30.0–36.0)
MCV: 89.1 fL (ref 80.0–100.0)
Platelets: 512 10*3/uL — ABNORMAL HIGH (ref 150–400)
RBC: 2.58 MIL/uL — ABNORMAL LOW (ref 3.87–5.11)
RDW: 18.6 % — ABNORMAL HIGH (ref 11.5–15.5)
WBC: 13.6 10*3/uL — ABNORMAL HIGH (ref 4.0–10.5)
nRBC: 0 % (ref 0.0–0.2)

## 2022-07-14 LAB — IRON AND TIBC
Iron: 34 ug/dL (ref 28–170)
Saturation Ratios: 15 % (ref 10.4–31.8)
TIBC: 234 ug/dL — ABNORMAL LOW (ref 250–450)
UIBC: 200 ug/dL

## 2022-07-14 LAB — BASIC METABOLIC PANEL
Anion gap: 10 (ref 5–15)
BUN: 12 mg/dL (ref 6–20)
CO2: 28 mmol/L (ref 22–32)
Calcium: 8 mg/dL — ABNORMAL LOW (ref 8.9–10.3)
Chloride: 98 mmol/L (ref 98–111)
Creatinine, Ser: 2.98 mg/dL — ABNORMAL HIGH (ref 0.44–1.00)
GFR, Estimated: 18 mL/min — ABNORMAL LOW (ref >60)
Glucose, Bld: 113 mg/dL — ABNORMAL HIGH (ref 70–99)
Potassium: 2.8 mmol/L — ABNORMAL LOW (ref 3.5–5.1)
Sodium: 136 mmol/L (ref 135–145)

## 2022-07-14 LAB — GLUCOSE, CAPILLARY
Glucose-Capillary: 104 mg/dL — ABNORMAL HIGH (ref 70–99)
Glucose-Capillary: 123 mg/dL — ABNORMAL HIGH (ref 70–99)

## 2022-07-14 LAB — RETICULOCYTES
Immature Retic Fract: 30.5 % — ABNORMAL HIGH (ref 2.3–15.9)
RBC.: 2.58 MIL/uL — ABNORMAL LOW (ref 3.87–5.11)
Retic Count, Absolute: 74 10*3/uL (ref 19.0–186.0)
Retic Ct Pct: 2.9 % (ref 0.4–3.1)

## 2022-07-14 LAB — FERRITIN: Ferritin: 1173 ng/mL — ABNORMAL HIGH (ref 11–307)

## 2022-07-14 LAB — VITAMIN B12: Vitamin B-12: 1219 pg/mL — ABNORMAL HIGH (ref 180–914)

## 2022-07-14 LAB — FOLATE: Folate: 15 ng/mL (ref >5.9)

## 2022-07-14 MED ORDER — COLCHICINE 0.6 MG PO TABS: 0.6000 mg | ORAL_TABLET | ORAL | Status: DC

## 2022-07-14 MED ORDER — COLCHICINE 0.6 MG PO TABS: 0.3000 mg | ORAL_TABLET | ORAL | Status: DC

## 2022-07-14 MED ORDER — DARBEPOETIN ALFA 100 MCG/0.5ML IJ SOSY
100.0000 ug | PREFILLED_SYRINGE | INTRAMUSCULAR | Status: DC
  Filled 2022-07-14: qty 0.5

## 2022-07-14 MED ORDER — HYDROCERIN EX CREA
TOPICAL_CREAM | Freq: Two times a day (BID) | CUTANEOUS | Status: DC
  Filled 2022-07-14: qty 113

## 2022-07-14 MED ORDER — CHLORHEXIDINE GLUCONATE CLOTH 2 % EX PADS: 6.0000 | MEDICATED_PAD | Freq: Every day | CUTANEOUS | Status: DC

## 2022-07-14 MED ORDER — HYDROMORPHONE HCL 1 MG/ML IJ SOLN
0.5000 mg | Freq: Once | INTRAMUSCULAR | Status: AC
  Administered 2022-07-14: 0.5 mg via INTRAVENOUS
  Filled 2022-07-14: qty 0.5

## 2022-07-14 MED ORDER — COLCHICINE 0.6 MG PO TABS: 0.3000 mg | ORAL_TABLET | ORAL | 0 refills | Status: DC

## 2022-07-14 NOTE — Plan of Care (Signed)
[facility] Patient Discharge Orders- [facility] CLINIC: [place]  Patient's name: KARLETTA PRUDEN Admit/DC Dates: 07/13/2022 - 07/14/22  Discharge Diagnoses: ESRD needing dialysis   Aranesp: Given: Yes, to be given today   Date and amount of last dose:  Last Hgb: 7.3 PRBC's Given: No Date/# of units: N/A ESA dose for discharge: mircera 150 mcg IV q 2 weeks  IV Iron dose at discharge: None  Heparin change: None  EDW Change: Yes New EDW: 131.8kg  Bath Change: No  Access intervention/Change: No Details:  Hectorol/Calcitriol change: No  Discharge Labs: Calcium 8.0 Phosphorus 4.5 Albumin 2.2 K+ 2.8 (post HD)  IV Antibiotics: No Details:  On Coumadin?: No  Last INR: Next INR: Managed By:   OTHER/APPTS/LAB ORDERS: Reports she has transportation now, please let SW know if still an issue. Continue 3K bath and weekly K+ checks    D/C Meds to be reconciled by nurse after every discharge.  Completed By: Rogers Blocker, PA-C 07/14/2022, 10:46 AM  [facility] Pager: [phone]  Reviewed by: MD:______ RN_______

## 2022-07-14 NOTE — TOC Progression Note (Signed)
Transition of Care [facility]) - Progression Note    Patient Details  Name: Deborah [name] MRN: [id number] Date of Birth: December 18, 1965  Transition of Care [facility]) CM/SW Contact  Leander Rams, LCSW Phone Number: 07/14/2022, 10:10 AM  Clinical Narrative:    CSW met with pt at bedside to provide Access [place] application for transportation to HD. Pt reported she has transportation set up with her insurance [employer]. Pt stated that her first pick up is set for this Friday and she hopes to dc today.   TOC will remain available.         Expected Discharge Plan and Services                                               Social Determinants of Health (SDOH) Interventions SDOH Screenings   Food Insecurity: No Food Insecurity (06/21/2022)  Recent Concern: Food Insecurity - Food Insecurity Present (05/29/2022)  Housing: Low Risk  (06/21/2022)  Recent Concern: Housing - High Risk (05/29/2022)  Transportation Needs: No Transportation Needs (06/21/2022)  Recent Concern: Transportation Needs - Unmet Transportation Needs (06/19/2022)  Utilities: Not At Risk (06/21/2022)  Recent Concern: Utilities - At Risk (06/19/2022)  Depression (PHQ2-9): Medium Risk (09/16/2021)  Tobacco Use: Low Risk  (07/01/2022)    Readmission Risk Interventions    07/05/2022    1:20 PM  Readmission Risk Prevention Plan  Transportation Screening Complete  Medication Review (RN Care Manager) Referral to Pharmacy  PCP or Specialist appointment within 3-5 days of discharge Complete  HRI or Home Care Consult Complete  SW Recovery Care/Counseling Consult Complete  Palliative Care Screening Not Applicable  Skilled Nursing Facility Not Applicable  Oletta Lamas, MSW, [place], [place] Transitions of Care  Clinical Social Worker I

## 2022-07-14 NOTE — Progress Notes (Signed)
  Transition of Care [facility]) Screening Note   Patient Details  Name: Deborah [name] Date of Birth: March 11, 1965   Transition of Care [facility]) CM/SW Contact:    Leone Haven, RN Phone Number: 07/14/2022, 11:18 AM    Transition of Care Department [facility]) has reviewed patient , she is from home alone, presents with CHF, also a HD patient, has insurance and PCP on file.  We will continue to monitor patient advancement through interdisciplinary progression rounds. If new patient transition needs arise, please place a TOC consult.

## 2022-07-14 NOTE — Progress Notes (Signed)
D/C order noted. Contacted [facility] to advise clinic of pt's d/c date and that pt should resume care on Friday. Clinic advised that pt has reported to staff that she has made arrangements with insurance for transportation to HD starting on Friday that can assist pt with getting out of home to HD.   Olivia Canter Renal Navigator [phone]

## 2022-07-14 NOTE — Consult Note (Addendum)
WOC Nurse Consult Note: patient known to WOC team from previous admission in 06/2022  Reason for Consult: L medial heel wound  Wound type: Stage 2 Pressure Injury left medial heel previously noted as a serum filled blister, today skin pink and dry  Pressure Injury POA: Yes Measurement: 2 cm x 2 cm Wound bed: 100% pink and dry  Drainage (amount, consistency, odor)  none Periwound: dark peeling skin  Dressing procedure/placement/frequency: Silicone foam over L medial heel Stage 2  for protection.  Lift foam daily to assess area.  Change foam dressing q3 days and prn soiling.   Eucerin cream ordered for dry peeling skin to L foot and heel.    POC discussed with patient and bedside nurse.  WOC team will not follow at this time. Re-consult if further needs arise.   Thank you,    Priscella Mann MSN, RN-BC, [employer] [phone]

## 2022-07-14 NOTE — TOC Transition Note (Addendum)
Transition of Care [facility]) - CM/SW Discharge Note   Patient Details  Name: Deborah [name] MRN: [id number] Date of Birth: 10-27-1965  Transition of Care [facility]) CM/SW Contact:  Leone Haven, RN Phone Number: 07/14/2022, 11:53 AM   Clinical Narrative:    Patient is set up with [facility] for [facility], she will need ambulance transport home, and awaiting HH orders for [facility] services.  NCM asked MD if she wants to order physical therapy eval, MD states patients told her she does not want physical therapy eval because she has not walked in 6 years and seeing physical therapy will not change that.   NCM confirmed this with patient, she also states she is going home and she is not going to a SNF.    Final next level of care: Home w Home Health Services Barriers to Discharge: No Barriers Identified   Patient Goals and CMS Choice CMS Medicare.gov Compare Post Acute Care list provided to:: Patient Choice offered to / list presented to : Patient  Discharge Placement                         Discharge Plan and Services Additional resources added to the After Visit Summary for   In-house Referral: NA Discharge Planning Services: CM Consult Post Acute Care Choice: Home Health          DME Arranged: N/A DME Agency: NA       HH Arranged: RN, Disease Management, PT, Social Work [employer] Agency: [facility] Date [facility] Agency Contacted: 07/14/22 Time HH Agency Contacted: 1140 Representative spoke with at [facility] Agency: Kandee Keen  Social Determinants of Health (SDOH) Interventions SDOH Screenings   Food Insecurity: No Food Insecurity (06/21/2022)  Recent Concern: Food Insecurity - Food Insecurity Present (05/29/2022)  Housing: Low Risk  (06/21/2022)  Recent Concern: Housing - High Risk (05/29/2022)  Transportation Needs: No Transportation Needs (06/21/2022)  Recent Concern: Transportation Needs - Unmet Transportation Needs (06/19/2022)  Utilities: Not At Risk (06/21/2022)  Recent Concern:  Utilities - At Risk (06/19/2022)  Depression (PHQ2-9): Medium Risk (09/16/2021)  Tobacco Use: Low Risk  (07/01/2022)     Readmission Risk Interventions    07/05/2022    1:20 PM  Readmission Risk Prevention Plan  Transportation Screening Complete  Medication Review (RN Care Manager) Referral to Pharmacy  PCP or Specialist appointment within 3-5 days of discharge Complete  HRI or Home Care Consult Complete  SW Recovery Care/Counseling Consult Complete  Palliative Care Screening Not Applicable  Skilled Nursing Facility Not Applicable

## 2022-07-14 NOTE — Plan of Care (Signed)
  Problem: Education: Goal: Ability to describe self-care measures that may prevent or decrease complications (Diabetes Survival Skills Education) will improve Outcome: Progressing   Problem: Coping: Goal: Ability to adjust to condition or change in health will improve Outcome: Progressing   Problem: Fluid Volume: Goal: Ability to maintain a balanced intake and output will improve Outcome: Progressing   Problem: Health Behavior/Discharge Planning: Goal: Ability to identify and utilize available resources and services will improve Outcome: Progressing Goal: Ability to manage health-related needs will improve Outcome: Progressing   Problem: Metabolic: Goal: Ability to maintain appropriate glucose levels will improve Outcome: Progressing   Problem: Nutritional: Goal: Maintenance of adequate nutrition will improve Outcome: Progressing Goal: Progress toward achieving an optimal weight will improve Outcome: Progressing   Problem: Skin Integrity: Goal: Risk for impaired skin integrity will decrease Outcome: Progressing   

## 2022-07-14 NOTE — Progress Notes (Signed)
[facility] Progress Note   Subjective:   Pt had dialysis overnight, 3L UF tolerated well. She reports her arm is sore from frequent blood draws but otherwise no complaints. Denies SOB, CP, dizziness, nausea.   Objective Vitals:   07/14/22 0125 07/14/22 0130 07/14/22 0135 07/14/22 0539  BP: (!) 114/57   (!) 107/55  Pulse: (!) 101 100 (!) 102 85  Resp: 12 14 16 18   Temp: 98.4 F (36.9 C)     TempSrc: Oral     SpO2: 99% 100% 100% 93%  Weight:      Height:       Physical Exam General: Alert female in NAD Heart: RRR, no murmurs, rubs or gallops Lungs: CTA bilaterally Abdomen: Soft, non-distended, +BS Extremities: No edema b/l lower extremities  Dialysis Access: TDC, LUE AVF maturing  Additional Objective Labs: Basic Metabolic Panel: Recent Labs  Lab 07/13/22 1550 07/13/22 1855 07/14/22 0302  NA 136 136 136  K 3.2* 3.6 2.8*  CL 99 101 98  CO2 24 22 28   GLUCOSE 145* 120* 113*  BUN 30* 31* 12  CREATININE 5.92* 5.73* 2.98*  CALCIUM 8.4* 8.3* 8.0*  PHOS  --  4.5  --    Liver Function Tests: Recent Labs  Lab 07/13/22 1855  ALBUMIN 2.2*   No results for input(s): "LIPASE", "AMYLASE" in the last 168 hours. CBC: Recent Labs  Lab 07/13/22 1550 07/13/22 1855 07/14/22 0302  WBC 12.0* 13.2* 13.6*  NEUTROABS 8.9*  --   --   HGB 8.7* 7.5* 7.3*  HCT 29.1* 23.6* 23.0*  MCV 92.7 90.4 89.1  PLT 568* 486* 512*   Blood Culture    Component Value Date/Time   SDES BLOOD RIGHT ANTECUBITAL 06/28/2022 0703   SPECREQUEST  06/28/2022 0703    BOTTLES DRAWN AEROBIC ONLY Blood Culture results may not be optimal due to an inadequate volume of blood received in culture bottles   CULT  06/28/2022 0703    NO GROWTH 5 DAYS Performed at [facility], [address], [place], [place] [address]    REPTSTATUS 07/03/2022 FINAL 06/28/2022 0703    Cardiac Enzymes: No results for input(s): "CKTOTAL", "CKMB", "CKMBINDEX", "TROPONINI" in the last 168  hours. CBG: Recent Labs  Lab 07/13/22 1840 07/13/22 2038  GLUCAP 111* 158*   Iron Studies: No results for input(s): "IRON", "TIBC", "TRANSFERRIN", "FERRITIN" in the last 72 hours. @lablastinr3 @ Studies/Results: DG Abd 1 View  Result Date: 07/13/2022 CLINICAL DATA:  Intractable abdominal pain. EXAM: ABDOMEN - 1 VIEW COMPARISON:  June 02, 2022. FINDINGS: The bowel gas pattern is normal. No radio-opaque calculi or other significant radiographic abnormality are seen. IMPRESSION: Negative. Electronically Signed   By: Lupita Raider M.D.   On: 07/13/2022 17:45   DG Chest 1 View  Result Date: 07/13/2022 CLINICAL DATA:  Dialysis patient.  [name]. EXAM: CHEST  1 VIEW COMPARISON:  06/18/2022 FINDINGS: Central line tip in the SVC above the right atrium. Cardiomegaly as seen previously. Pulmonary venous hypertension without frank edema. No infiltrate, collapse or effusion. IMPRESSION: Cardiomegaly and pulmonary venous hypertension. No frank edema. Electronically Signed   By: Paulina Fusi M.D.   On: 07/13/2022 16:36   Medications:   apixaban  5 mg Oral BID   atorvastatin  10 mg Oral QHS   carvedilol  12.5 mg Oral BID WC   Chlorhexidine Gluconate Cloth  6 each Topical Q0600   colchicine  0.6 mg Oral Daily   famotidine  20 mg  Oral Daily   ferrous sulfate  325 mg Oral BID WC   folic acid  1 mg Oral Daily   furosemide  80 mg Oral Daily   guaiFENesin  600 mg Oral BID   insulin aspart  0-6 Units Subcutaneous TID WC   insulin aspart  5 Units Subcutaneous TID WC   mirtazapine  15 mg Oral QHS    Dialysis Orders: Center: [facility]  on MWF . 180NRe 4 hours BFR 400 DFR 800 EDW 136.5kg (left at 135.3kg last HD) 3K 2Ca TDC No heparin Mircera IV q 2 weeks- not given yet    Assessment/Plan:  ESRD:  missed dialysis due to inability to transfer out of her house, down the stairs and into the vehicle. Reports she has new transportation set up who can help her out of her house. Using [facility], new  AVF placed last admission. Finished HD early this AM, will plan for next HD tomorrow then can resume MWF schedule.  Abdominal pain: Denies any pain this morning. Management per admitting team  Hypertension/volume: BP controlled, trace edema on exam. Tolerated 3L UF, will need lower EDW at discharge.   Anemia: Hgb 7.3. Due for next dose of ESA, will order.   Metabolic bone disease: Calcium and phosphorus controlled. Not on VDRA or phosphorus binder  Nutrition:  Will need renal diet/fluid restrictions T2DM: management per primary team, on insulin Hypokalemia: Lab taken right after HD, would not replete yet. Using high K+ bath with HD.   Rogers Blocker, PA-C 07/14/2022, 7:02 AM  [facility] Pager: [phone]

## 2022-07-14 NOTE — TOC Initial Note (Signed)
Transition of Care [facility]) - Initial/Assessment Note    Patient Details  Name: Deborah [name] MRN: [id number] Date of Birth: 05/05/1965  Transition of Care [facility]) CM/SW Contact:    Leone Haven, RN Phone Number: 07/14/2022, 11:41 AM  Clinical Narrative:                 Patient is from home alone, she states her 57 year old mother helps her out also.  NCM asked her if she can get off the [facility] by herself, she states yes.  NCM informed her that the paramedics called and spoke with the Staff RN that patient has called them around 9 times to help her get off the commode sometimes.  She states she has called them sometimes but not nine times.  NCM asked if she needs to look at other plans for someone to stay with her, she states she does not need anyone she has a aide with [facility] form 8 am to 11 am Monday thru Saturday.  She has HD on MWF.  She states sometimes her legs fall asleep,  and she has spinal stenosis, but she has a w/chair that she uses at home to help her get around inside and outside.  She also has a BSC, shower chair and 3 n 1.  She states she will need ambulance transport home.  NCM offered choice to her for [facility], HHPT, and Child psychotherapist, she states she would like to have [facility] but has no preference.  Patient keeps telling this NCM that she can get up on her own. NCM made referral to [facility] with Frances Furbish, he is able to take referral.  Soc will begin 24 to 48 hrs post dc.  Patient also told CSW that she has transportation set up for her  HD already.  Expected Discharge Plan: Home w Home Health Services Barriers to Discharge: No Barriers Identified   Patient Goals and CMS Choice Patient states their goals for this hospitalization and ongoing recovery are:: to return home with [facility] CMS Medicare.gov Compare Post Acute Care list provided to:: Patient Choice offered to / list presented to : Patient      Expected Discharge Plan and Services In-house Referral: NA Discharge  Planning Services: CM Consult Post Acute Care Choice: Home Health Living arrangements for the past 2 months: Apartment Expected Discharge Date: 07/14/22               DME Arranged: N/A DME Agency: NA       HH Arranged: RN, Disease Management, PT, Social Work [employer] Agency: [facility] Date [facility] Agency Contacted: 07/14/22 Time HH Agency Contacted: 1140 Representative spoke with at [facility] Agency: Kandee Keen  Prior Living Arrangements/Services Living arrangements for the past 2 months: Apartment Lives with:: Self Patient language and need for interpreter reviewed:: Yes        Need for Family Participation in Patient Care: Yes (Comment) Care giver support system in place?: Yes (comment) Current home services: DME (wheelchair, [facility]) Criminal Activity/Legal Involvement Pertinent to Current Situation/Hospitalization: No - Comment as needed  Activities of Daily Living      Permission Sought/Granted                  Emotional Assessment Appearance:: Appears stated age Attitude/Demeanor/Rapport: Engaged Affect (typically observed): Appropriate Orientation: : Oriented to Self, Oriented to Place, Oriented to  Time, Oriented to Situation Alcohol / Substance Use: Not Applicable Psych Involvement: No (comment)  Admission diagnosis:  CHF (congestive  heart failure) (HCC) [I50.9] Patient Active Problem List   Diagnosis Date Noted   CHF (congestive heart failure) (HCC) 07/13/2022   HCAP (healthcare-associated pneumonia) 06/19/2022   Acute renal failure superimposed on stage 4 chronic kidney disease (HCC) 05/29/2022   Acute encephalopathy 05/29/2022   Hyponatremia 05/29/2022   Anemia 05/22/2022   Hyperosmolar hyperglycemic state (HHS) (HCC) 05/11/2022   Hyperosmolar non-ketotic state due to type 2 diabetes mellitus (HCC) 05/11/2022   Leukocytosis 05/11/2022   Edema of left upper extremity 03/21/2022   Tachycardia 03/18/2022   Atypical chest pain 09/10/2021   Acute pyelonephritis     Acute kidney injury superimposed on chronic kidney disease (HCC) 08/16/2021   Acute on chronic kidney failure (HCC) 08/15/2021   PAF (paroxysmal atrial fibrillation) (HCC)    Chronic pain 09/04/2019   Malignant carcinoid tumor of duodenum (HCC)    Nonalcoholic fatty liver disease 06/05/2019   Restrictive lung disease secondary to obesity    History of gastric ulcer    Fibroid uterus 02/23/2019   Abdominal pain 07/17/2018   Anxiety 11/29/2017   Spinal stenosis, lumbar region with neurogenic claudication 08/03/2017   GERD (gastroesophageal reflux disease) 03/19/2017   Depression 03/19/2017   Morbid obesity (HCC)    Normocytic anemia 08/16/2016   Diabetic gastroparesis (HCC) 08/16/2016   Gout 06/05/2016   Hypomagnesemia and hypokalemia 09/26/2015   Type 2 diabetes mellitus with hyperglycemia, with long-term current use of insulin (HCC) 05/25/2015   Chronic kidney disease (CKD), stage IV (severe) (HCC) 05/25/2015   Essential hypertension, benign 09/28/2013   PCP:  Elsie Amis, MD Pharmacy:   [facility] - [place], [place] - [address] [place] [address] Phone: [phone] Fax: [phone]  [facility] [address] [place] [address] Phone: [phone] Fax: [phone]     Social Determinants of Health (SDOH) Social History: SDOH Screenings   Food Insecurity: No Food Insecurity (06/21/2022)  Recent Concern: Food Insecurity - Food Insecurity Present (05/29/2022)  Housing: Low Risk  (06/21/2022)  Recent Concern: Housing - High Risk (05/29/2022)  Transportation Needs: No Transportation Needs (06/21/2022)  Recent Concern: Transportation Needs - Unmet Transportation Needs (06/19/2022)  Utilities: Not At Risk (06/21/2022)  Recent Concern: Utilities - At Risk (06/19/2022)  Depression (PHQ2-9): Medium Risk (09/16/2021)  Tobacco Use: Low Risk  (07/01/2022)   SDOH Interventions:      Readmission Risk Interventions    07/05/2022    1:20 PM  Readmission Risk Prevention Plan  Transportation Screening Complete  Medication Review (RN Care Manager) Referral to Pharmacy  PCP or Specialist appointment within 3-5 days of discharge Complete  HRI or Home Care Consult Complete  SW Recovery Care/Counseling Consult Complete  Palliative Care Screening Not Applicable  Skilled Nursing Facility Not Applicable

## 2022-07-14 NOTE — Progress Notes (Signed)
   07/14/22 0125  Pain Assessment  Pain Scale 0-10  Pain Score 0  Hemodialysis Catheter Right Subclavian Double lumen Permanent (Tunneled)  Placement Date/Time: 06/24/22 1644   Serial / Lot #: [id number]  Expiration Date: 09/08/26  Time Out: Correct patient;Correct site;Correct procedure  Maximum sterile barrier precautions: Hand hygiene;Mask;Cap;Sterile gloves;Sterile gown;Large sterile ...  Site Condition No complications  Blue Lumen Status Flushed;Heparin locked;Dead end cap in place  Red Lumen Status Flushed;Dead end cap in place;Heparin locked  Catheter fill solution Heparin 1000 units/ml  Catheter fill volume (Arterial) 1.9 cc  Catheter fill volume (Venous) 1.9  Dressing Type Transparent  Dressing Status Antimicrobial disc in place;Clean, Dry, Intact  Interventions Other (Comment)  Drainage Description None  Dressing Change Due 07/19/22  Post treatment catheter status Capped and Clamped  Fistula / Graft Left Upper arm Arteriovenous fistula  Placement Date/Time: 06/30/22 1200   Orientation: Left  Access Location: Upper arm  Access Type: Arteriovenous fistula  Fistula / Graft Assessment Thrill;Bruit;Present  Status  (Maturing, not in use)  Drainage Description None  Neurological  Level of Consciousness Alert  Orientation Level Oriented X4  Respiratory  Respiratory Pattern Regular;Unlabored  Chest Assessment Chest expansion symmetrical  Bilateral Breath Sounds Clear;Diminished  R Lower Breath Sounds Diminished  L Lower Breath Sounds Diminished  Cardiac  Pulse Regular  Heart Sounds S1, S2  Jugular Venous Distention (JVD) No  ECG Monitor Yes  Cardiac Rhythm NSR  Vascular  R Radial Pulse +2  L Radial Pulse +2  GU Assessment  Genitourinary (WDL) X  Genitourinary Symptoms Oliguria  Psychosocial  Psychosocial (WDL) WDL  Patient Behaviors Calm   Received patient in bed to unit.  Alert and oriented.  Informed consent signed and in chart.   TX duration:3.30  Patient  tolerated well. - Yes Transported back to the room - Yes Alert, without acute distress. - Yes Hand-off given to patient's nurse. - Yes Ilean Skill  Access used: Rt chest [employer] issues: Unable to meet prescribed BFR  Total UF removed: 3.0L Medication(s) given: None Post HD VS: See EMR V/S flowsheet Post HD weight: 128.8kg calculated   Deborah [name] [name]

## 2022-07-20 ENCOUNTER — Encounter: Payer: 59 | Attending: Registered Nurse | Admitting: Registered Nurse

## 2022-07-20 ENCOUNTER — Encounter: Payer: Self-pay | Admitting: Registered Nurse

## 2022-07-20 VITALS — BP 100/65 | HR 91 | Ht 66.0 in | Wt 295.0 lb

## 2022-07-20 DIAGNOSIS — Z5181 Encounter for therapeutic drug level monitoring: Secondary | ICD-10-CM | POA: Insufficient documentation

## 2022-07-20 DIAGNOSIS — M545 Low back pain, unspecified: Secondary | ICD-10-CM | POA: Diagnosis present

## 2022-07-20 DIAGNOSIS — C7A010 Malignant carcinoid tumor of the duodenum: Secondary | ICD-10-CM | POA: Diagnosis present

## 2022-07-20 DIAGNOSIS — G894 Chronic pain syndrome: Secondary | ICD-10-CM | POA: Insufficient documentation

## 2022-07-20 DIAGNOSIS — G893 Neoplasm related pain (acute) (chronic): Secondary | ICD-10-CM | POA: Insufficient documentation

## 2022-07-20 DIAGNOSIS — G8929 Other chronic pain: Secondary | ICD-10-CM | POA: Insufficient documentation

## 2022-07-20 DIAGNOSIS — Z79891 Long term (current) use of opiate analgesic: Secondary | ICD-10-CM

## 2022-07-20 NOTE — Progress Notes (Signed)
Subjective:    Patient ID: Deborah [name], female    DOB: April 24, 1965, 57 y.o.   MRN: [id number]  HPI: Deborah [name] is a 57 y.o. female who returns for follow up appointment for chronic pain and medication refill. She  states  Her  pain is located in her abdomen and generalized pain all over. She  rates her pain 10. She is not following a current exercise regimen, arrived in wheelchair.   Deborah [name] looking for a refill on her Dilaudid, her last office visit was 02/23/2022, Oral swab was performed. Reviewed office policy, awaiting results. Deborah [name] asked if I would speak with Dr Carlis Abbott regarding the above. Dr Carlis Abbott also agrees with awaiting results. This was discussed with Deborah [name].   Deborah [name] has had many hospitalizations since December 2023.  She was Hospitalized at [facility] on 03/17/2022 and discharged on 03/22/2022, admitted with AKI and Gastroparesis.   She was admitted to [facility] on 05/20/2022 and discharged on 05/27/2022 with Hyperosmolar Non Ketotic State due to Type 2 DM. She was discharged to SNF.   Deborah [name] was hospitalized at [facility]  for Acute Renal Failure Superimposed on Stage 4- CKD- Dialysis. On 05/28/2022 and Discharged on 06/04/2022 to SNF.   Deborah [name] was admitted to [facility] on 06/18/2022 and discharged on 07/05/2022 for HCAP.   All discharged Summaries was reviewed.   Oral Swab was Performed today .     Pain Inventory Average Pain 10 Pain Right Now 10 My pain is constant, sharp, and aching  In the last 24 hours, has pain interfered with the following? General activity 10 Relation with others 10 Enjoyment of life 10 What TIME of day is your pain at its worst? morning , daytime, evening, night, and varies Sleep (in general) Poor  Pain is worse with: walking, bending, sitting, and standing Pain improves with: rest and medication Relief from Meds: 9  Family History  Problem Relation Age of Onset    Diabetes Mother    Diabetes Father    Heart disease Father    Diabetes Sister    Congestive Heart Failure Sister 67   Diabetes Brother    Social History   Socioeconomic History   Marital status: Married    Spouse name: Not on file   Number of children: Not on file   Years of education: Not on file   Highest education level: Not on file  Occupational History   Not on file  Tobacco Use   Smoking status: Never   Smokeless tobacco: Never  Vaping Use   Vaping Use: Never used  Substance and Sexual Activity   Alcohol use: No   Drug use: No   Sexual activity: Not Currently    Birth control/protection: None  Other Topics Concern   Not on file  Social History Narrative   ** Merged History Encounter **       Social Determinants of Health   Financial Resource Strain: Not on file  Food Insecurity: No Food Insecurity (06/21/2022)   Hunger Vital Sign    Worried About Running Out of Food in the Last Year: Never true    Ran Out of Food in the Last Year: Never true  Recent Concern: Food Insecurity - Food Insecurity Present (05/29/2022)   Hunger Vital Sign    Worried About Running Out of Food in the Last Year: Often true    Ran Out of Food in the Last Year: Often true  Transportation  Needs: No Transportation Needs (06/21/2022)   PRAPARE - Administrator, Civil Service (Medical): No    Lack of Transportation (Non-Medical): No  Recent Concern: Transportation Needs - Unmet Transportation Needs (06/19/2022)   PRAPARE - Administrator, Civil Service (Medical): Yes    Lack of Transportation (Non-Medical): Yes  Physical Activity: Not on file  Stress: Not on file  Social Connections: Not on file   Past Surgical History:  Procedure Laterality Date   AV FISTULA PLACEMENT Left 06/30/2022   Procedure: LEFT BRACHIOCEPHALIC ARTERIOVENOUS (AV) FISTULA CREATION;  Surgeon: Cephus Shelling, MD;  Location: [facility] OR;  Service: Vascular;  Laterality: Left;   BIOPSY  07/27/2019    Procedure: BIOPSY;  Surgeon: Vida Rigger, MD;  Location: [facility];  Service: Endoscopy;;   BIOPSY  07/30/2019   Procedure: BIOPSY;  Surgeon: Kathi Der, MD;  Location: [facility];  Service: Gastroenterology;;   CATARACT EXTRACTION  01/2014   CHOLECYSTECTOMY     COLONOSCOPY WITH PROPOFOL N/A 07/30/2019   Procedure: COLONOSCOPY WITH PROPOFOL;  Surgeon: Kathi Der, MD;  Location: [facility];  Service: Gastroenterology;  Laterality: N/A;   ESOPHAGOGASTRODUODENOSCOPY N/A 07/27/2019   Procedure: ESOPHAGOGASTRODUODENOSCOPY (EGD);  Surgeon: Vida Rigger, MD;  Location: [facility];  Service: Endoscopy;  Laterality: N/A;   ESOPHAGOGASTRODUODENOSCOPY N/A 07/26/2020   Procedure: ESOPHAGOGASTRODUODENOSCOPY (EGD);  Surgeon: Willis Modena, MD;  Location: [facility];  Service: Endoscopy;  Laterality: N/A;   ESOPHAGOGASTRODUODENOSCOPY (EGD) WITH PROPOFOL N/A 08/02/2019   Procedure: ESOPHAGOGASTRODUODENOSCOPY (EGD) WITH PROPOFOL;  Surgeon: Kathi Der, MD;  Location: [facility];  Service: Gastroenterology;  Laterality: N/A;   HEMOSTASIS CLIP PLACEMENT  08/02/2019   Procedure: HEMOSTASIS CLIP PLACEMENT;  Surgeon: Kathi Der, MD;  Location: [facility];  Service: Gastroenterology;;   IR FLUORO GUIDE CV LINE RIGHT  06/24/2022   IR US GUIDE VASC ACCESS RIGHT  06/24/2022   POLYPECTOMY  07/30/2019   Procedure: POLYPECTOMY;  Surgeon: Kathi Der, MD;  Location: [facility];  Service: Gastroenterology;;   POLYPECTOMY  08/02/2019   Procedure: POLYPECTOMY;  Surgeon: Kathi Der, MD;  Location: [facility];  Service: Gastroenterology;;   Past Surgical History:  Procedure Laterality Date   AV FISTULA PLACEMENT Left 06/30/2022   Procedure: LEFT BRACHIOCEPHALIC ARTERIOVENOUS (AV) FISTULA CREATION;  Surgeon: Cephus Shelling, MD;  Location: [facility] OR;  Service: Vascular;  Laterality: Left;   BIOPSY  07/27/2019   Procedure: BIOPSY;  Surgeon: Vida Rigger, MD;  Location: [facility];  Service: Endoscopy;;   BIOPSY  07/30/2019   Procedure: BIOPSY;  Surgeon: Kathi Der, MD;  Location: [facility];  Service: Gastroenterology;;   CATARACT EXTRACTION  01/2014   CHOLECYSTECTOMY     COLONOSCOPY WITH PROPOFOL N/A 07/30/2019   Procedure: COLONOSCOPY WITH PROPOFOL;  Surgeon: Kathi Der, MD;  Location: [facility];  Service: Gastroenterology;  Laterality: N/A;   ESOPHAGOGASTRODUODENOSCOPY N/A 07/27/2019   Procedure: ESOPHAGOGASTRODUODENOSCOPY (EGD);  Surgeon: Vida Rigger, MD;  Location: [facility];  Service: Endoscopy;  Laterality: N/A;   ESOPHAGOGASTRODUODENOSCOPY N/A 07/26/2020   Procedure: ESOPHAGOGASTRODUODENOSCOPY (EGD);  Surgeon: Willis Modena, MD;  Location: [facility];  Service: Endoscopy;  Laterality: N/A;   ESOPHAGOGASTRODUODENOSCOPY (EGD) WITH PROPOFOL N/A 08/02/2019   Procedure: ESOPHAGOGASTRODUODENOSCOPY (EGD) WITH PROPOFOL;  Surgeon: Kathi Der, MD;  Location: [facility];  Service: Gastroenterology;  Laterality: N/A;   HEMOSTASIS CLIP PLACEMENT  08/02/2019   Procedure: HEMOSTASIS CLIP PLACEMENT;  Surgeon: Kathi Der, MD;  Location: [facility];  Service: Gastroenterology;;   IR FLUORO GUIDE CV LINE RIGHT  06/24/2022   IR US GUIDE VASC ACCESS RIGHT  06/24/2022   POLYPECTOMY  07/30/2019   Procedure: POLYPECTOMY;  Surgeon: Kathi Der, MD;  Location: [facility];  Service: Gastroenterology;;   POLYPECTOMY  08/02/2019   Procedure: POLYPECTOMY;  Surgeon: Kathi Der, MD;  Location: [facility];  Service: Gastroenterology;;   Past Medical History:  Diagnosis Date   Acute back pain with sciatica, left    Acute back pain with sciatica, right    AKI (acute kidney injury) (HCC)    Anemia, unspecified    Cancer (HCC)    Carcinoid tumor of duodenum    Chest pain with normal coronary angiography 2019   Chronic kidney disease, stage 3b (HCC)    Chronic pain    Chronic systolic CHF (congestive heart failure) (HCC)     Diabetes mellitus    DKA (diabetic ketoacidosis) (HCC)    Drug-seeking behavior    21 hospitalizations and 14 CT a/p in 2 years for N/V and abdominal pain, demanding only IV dilaudid   Elevated troponin    chronic   Esophageal reflux    Fibromyalgia    Gastric ulcer    Gastroparesis    Gout    Hyperlipidemia    Hypertension    Hypokalemia    Hypomagnesemia    Lumbosacral stenosis    LVH (left ventricular hypertrophy)    Morbid obesity (HCC)    NICM (nonischemic cardiomyopathy) (HCC)    PAF (paroxysmal atrial fibrillation) (HCC)    Stroke (HCC) 02/2011   Thrombocytosis    Vitamin B12 deficiency anemia    LMP 10/10/2012   Opioid Risk Score:   Fall Risk Score:  `1  Depression screen [facility] 2/9     09/16/2021    9:16 AM 06/01/2021    1:55 PM 08/01/2020    2:56 PM 06/05/2020    3:01 PM 01/04/2020    2:20 PM 11/15/2019    3:35 PM 11/14/2019    2:04 PM  Depression screen PHQ 2/9  Decreased Interest 3 1 1 1 2 3 1   Down, Depressed, Hopeless 3 1 1 1 3 3 1   PHQ - 2 Score 6 2 2 2 5 6 2   Altered sleeping     3 3   Tired, decreased energy     3 3   Change in appetite     3 3   Feeling bad or failure about yourself      3 3   Trouble concentrating     3 3   Moving slowly or fidgety/restless     3 3   Suicidal thoughts     0 3   PHQ-9 Score     23 27      Review of Systems  Musculoskeletal:  Positive for arthralgias and back pain.       B/L leg  All other systems reviewed and are negative.     Objective:   Physical Exam Vitals and nursing note reviewed.  Constitutional:      Appearance: Normal appearance. She is ill-appearing.  Neck:     Comments: Cervical Paraspinal Tenderness: C-5-C-6   Cardiovascular:     Rate and Rhythm: Normal rate and regular rhythm.     Pulses: Normal pulses.     Heart sounds: Normal heart sounds.  Pulmonary:     Effort: Pulmonary effort is normal.     Breath sounds: Normal breath sounds.  Musculoskeletal:     Cervical back: Normal range of  motion and neck  supple.     Comments: Normal Muscle Bulk and Muscle Testing Reveals:  Upper Extremities: Decreased ROM and Muscle Strength 5/5 Thoracic and Lumbar Hypersensitivity Bilateral Greater Trochanter Tenderness Lower Extremities: Decreased ROM and Muscle Strength 5/5 Arrived in wheelchair     Skin:    General: Skin is warm and dry.  Neurological:     Mental Status: She is alert and oriented to person, place, and time.  Psychiatric:        Mood and Affect: Mood normal.        Behavior: Behavior normal.         Assessment & Plan:  1. 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 07/20/2022 2. Chronic Pain Syndrome: Oral Swab Performed today, awaiting results. If consistent will resume Dilaudid. Continue to Monitor.  3. Chronic Bilateral Lower Back Pain without sciatica: Continue HEP as Tolerated. Continue to Monitor. 07/20/2022    F/U in 2 months

## 2022-07-20 NOTE — Discharge Summary (Signed)
Physician Discharge Summary  Deborah [name]:[id number] DOB: 02/24/1966 DOA: 07/13/2022  PCP: Elsie Amis, MD  Admit date: 07/13/2022 Discharge date: 07/14/2022  Time spent: 35 minutes  Recommendations for Outpatient Follow-up:  PCP in 1 week, consider goals of care discussions Nephrology on hemodialysis Friday 5/10   Discharge Diagnoses:  Volume overload Missed hemodialysis ESRD Morbid obesity Severe debility   Type 2 diabetes mellitus with hyperglycemia, with long-term current use of insulin (HCC)   Essential hypertension, benign   Abdominal pain   Acute on chronic kidney failure [facility])   Discharge Condition: Stable  Diet recommendation: Renal, diabetic  Filed Weights   07/13/22 1800  Weight: 131.8 kg    History of present illness:  Deborah [name] is a 57 y.o. female with medical history significant of CKD stage IV with recently started HD, HTN, IDDM, diabetic neuropathy, diabetic gastroparesis, HTN, PAF on Eliquis, chronic HFpEF chronic back pain secondary to spinal stenosis, duodenum carcinoma status post resection in 2021 with chronic abdominal pain, narcotic dependence, morbid obesity, chronic ambulation dysfunction wheelchair-bound, presented with missed dialysis, worsening of abdominal pain. Patient was started on hemodialysis 2 weeks ago, with [facility] MWF, came with multiple complaints including worsening of abdominal pain, and missed dialysis.  She is wheelchair-bound and unable to walk down few steps to use transportation, in the ED was volume overloaded ED Course: Afebrile, none tachycardia none hypotension not hypoxic.  Blood work showed K3.2, creatinine 5.9 BUN 30 bicarb 24, WBC 12 hemoglobin 8.7 platelet 568.  Chest x-ray showed pulmonary vasculature congestion.  Hospital Course:   ESRD:  Volume overload -Missed dialysis on account of wheelchair, bedbound status, she was unable to transfer out of her house, down the stairs into the  vehicle for transportation to HD -Seen by nephrology complaint completed HD overnight -Anxious to go home, PT eval was not completed patient declined this, she reports she has not walked in 6 years and hence PT eval would not change anything -She is also made arrangements for alternate transportation to hemodialysis starting on Friday  -Anxious to be discharged home today  Abdominal pain:  -Likely related to volume overload, no further symptoms after HD overnight  Morbid obesity debility, type 2 diabetes mellitus Severe chronic debility, bed/wheelchair bound -All her chronic medical problems are stable and she is adamant to be discharged today  Discharge Exam: Vitals:   07/14/22 0857 07/14/22 1143  BP: (!) 114/56 (!) 113/56  Pulse: 87 83  Resp: 14 11  Temp: 98.2 F (36.8 C) 97.8 F (36.6 C)  SpO2: 97% 96%   Morbidly obese chronically ill female sitting up in bed, AAOx3 HEENT: Neck obese unable to assess JVD CVS: S1-S2, regular rhythm Lungs: Distant breath sounds Abdomen: Soft, nontender, bowel sounds present Extremities: Chronic skin changes Neuro: Significant lower extremity weakness, chronic  Discharge Instructions   Discharge Instructions     Diet - low sodium heart healthy   Complete by: As directed    Diet Carb Modified   Complete by: As directed    Discharge wound care:   Complete by: As directed    routine   Increase activity slowly   Complete by: As directed       Allergies as of 07/14/2022       Reactions   Diazepam [name]   Gabapentin [name], Swelling   Other reaction(s): Unknown   Iodinated Contrast Media Anaphylaxis   11/29/17 Cardiac arrest 1 min after IV contrast, possible allergy  vs vasovagal episode Iopamidol  Anaphylaxis  High 11/28/2017  Patient had seizure like activity and then code post 100 cc of isovue 300     Isovue [iopamidol] Anaphylaxis   11/28/17 Patient had seizure like activity and then 1 min code after  100 cc of isovue 300. Possible contrast allergy vs vasovagal episode   Lisinopril Anaphylaxis   Tongue and mouth swelling   Metoclopramide Other (See Comments)   Tardive dyskinesia Also known as Reglan    Nsaids Anaphylaxis, Other (See Comments)   ULCER   Penicillins Palpitations   Has patient had a PCN reaction causing immediate rash, facial/tongue/throat swelling, SOB or lightheadedness with hypotension: Yes, heart races Has patient had a PCN reaction causing severe rash involving mucus membranes or skin necrosis: No Has patient had a PCN reaction that required hospitalization: Yes  Has patient had a PCN reaction occurring within the last 10 years: No   Tolmetin Nausea Only, Other (See Comments)   ULCER   Dicyclomine Other (See Comments)   Chest pain   Acetaminophen Nausea Only, Other (See Comments)   Irritates stomach ulcer; Abdominal pain   Cyclobenzaprine Palpitations   Oxycodone Palpitations   Rifamycins Palpitations   Tramadol Nausea And Vomiting        Medication List     STOP taking these medications    diclofenac Sodium 1 % Gel Commonly known as: VOLTAREN       TAKE these medications    albuterol (2.5 MG/3ML) 0.083% nebulizer solution Commonly known as: PROVENTIL Take 3 mLs (2.5 mg total) by nebulization every 6 (six) hours as needed for wheezing or shortness of breath.   apixaban 5 MG Tabs tablet Commonly known as: ELIQUIS Take 5 mg by mouth 2 (two) times daily.   atorvastatin 10 MG tablet Commonly known as: LIPITOR Take 10 mg by mouth at bedtime.   carvedilol 12.5 MG tablet Commonly known as: COREG Take 1 tablet (12.5 mg total) by mouth 2 (two) times daily with a meal.   colchicine 0.6 MG tablet Take 0.5 tablets (0.3 mg total) by mouth 2 (two) times a week. What changed: when to take this   Easy Comfort Pen Needles 31G X 5 MM Misc Generic drug: Insulin Pen Needle USE 3 TIMES A DAY FOR INSULIN ADMINISTRATION   famotidine 20 MG  tablet Commonly known as: PEPCID Take 1 tablet (20 mg total) by mouth daily.   ferrous sulfate 325 (65 FE) MG tablet Take 1 tablet (325 mg total) by mouth 2 (two) times daily with a meal.   fluticasone 50 MCG/ACT nasal spray Commonly known as: FLONASE Place 2 sprays into both nostrils daily as needed for allergies or rhinitis. What changed:  how much to take reasons to take this   folic acid 1 MG tablet Commonly known as: FOLVITE Take 1 mg by mouth daily.   furosemide 80 MG tablet Commonly known as: LASIX Take 1 tablet (80 mg total) by mouth daily.   guaiFENesin 600 MG 12 hr tablet Commonly known as: MUCINEX Take 1 tablet (600 mg total) by mouth 2 (two) times daily.   insulin aspart 100 UNIT/ML injection Commonly known as: novoLOG Inject 5 Units into the skin 3 (three) times daily with meals.   insulin glargine-yfgn 100 UNIT/ML injection Commonly known as: SEMGLEE Inject 0.15 mLs (15 Units total) into the skin 2 (two) times daily.   insulin lispro 100 UNIT/ML KwikPen Commonly known as: HUMALOG Before each meal 3 times a day, 140-199 - 2  units, 200-250 - 6 units, 251-299 - 8 units,  300-349 - 12 units,  350 or above 14 units. What changed:  how much to take how to take this when to take this additional instructions   loperamide 2 MG tablet Commonly known as: IMODIUM A-D Take 2 mg by mouth every 6 (six) hours as needed for diarrhea or loose stools.   mirtazapine 15 MG disintegrating tablet Commonly known as: REMERON SOL-TAB Take 1 tablet (15 mg total) by mouth at bedtime.   multivitamin with minerals Tabs tablet Take 1 tablet by mouth daily.   ondansetron 4 MG disintegrating tablet Commonly known as: Zofran ODT Take 1 tablet (4 mg total) by mouth every 8 (eight) hours as needed for nausea or vomiting.   pantoprazole 40 MG tablet Commonly known as: PROTONIX TAKE 1 TABLET BY MOUTH 2 (TWO) TIMES DAILY. (AM+BEDTIME)   Vitamin D (Ergocalciferol) 1.25 MG (50000  UNIT) Caps capsule Commonly known as: DRISDOL Take 50,000 Units by mouth once a week.               Discharge Care Instructions  (From admission, onward)           Start     Ordered   07/14/22 0000  Discharge wound care:       Comments: routine   07/14/22 1045           Allergies  Allergen Reactions   Diazepam [name]   Gabapentin [name] and Swelling    Other reaction(s): Unknown   Iodinated Contrast Media Anaphylaxis    11/29/17 Cardiac arrest 1 min after IV contrast, possible allergy vs vasovagal episode Iopamidol  Anaphylaxis  High 11/28/2017  Patient had seizure like activity and then code post 100 cc of isovue 300     Isovue [Iopamidol] Anaphylaxis    11/28/17 Patient had seizure like activity and then 1 min code after 100 cc of isovue 300. Possible contrast allergy vs vasovagal episode   Lisinopril Anaphylaxis    Tongue and mouth swelling   Metoclopramide Other (See Comments)    Tardive dyskinesia  Also known as Reglan    Nsaids Anaphylaxis and Other (See Comments)    ULCER   Penicillins Palpitations    Has patient had a PCN reaction causing immediate rash, facial/tongue/throat swelling, SOB or lightheadedness with hypotension: Yes, heart races Has patient had a PCN reaction causing severe rash involving mucus membranes or skin necrosis: No Has patient had a PCN reaction that required hospitalization: Yes  Has patient had a PCN reaction occurring within the last 10 years: No    Tolmetin Nausea Only and Other (See Comments)    ULCER   Dicyclomine Other (See Comments)    Chest pain   Acetaminophen Nausea Only and Other (See Comments)    Irritates stomach ulcer; Abdominal pain   Cyclobenzaprine Palpitations   Oxycodone Palpitations   Rifamycins Palpitations   Tramadol Nausea And Vomiting    Follow-up Information     [facility] Follow up.   Specialty: Home Health Services Why: Agency will call you to set up  apt times Contact information: [address] [place] [address] [phone]                  The results of significant diagnostics from this hospitalization (including imaging, microbiology, ancillary and laboratory) are listed below for reference.    Significant Diagnostic Studies: DG Abd 1 View  Result Date: 07/13/2022 CLINICAL  DATA:  Intractable abdominal pain. EXAM: ABDOMEN - 1 VIEW COMPARISON:  June 02, 2022. FINDINGS: The bowel gas pattern is normal. No radio-opaque calculi or other significant radiographic abnormality are seen. IMPRESSION: Negative. Electronically Signed   By: Lupita Raider M.D.   On: 07/13/2022 17:45   DG Chest 1 View  Result Date: 07/13/2022 CLINICAL DATA:  Dialysis patient.  [name]. EXAM: CHEST  1 VIEW COMPARISON:  06/18/2022 FINDINGS: Central line tip in the SVC above the right atrium. Cardiomegaly as seen previously. Pulmonary venous hypertension without frank edema. No infiltrate, collapse or effusion. IMPRESSION: Cardiomegaly and pulmonary venous hypertension. No frank edema. Electronically Signed   By: Paulina Fusi M.D.   On: 07/13/2022 16:36   VAS [place] UPPER EXTREMITY VENOUS DUPLEX  Result Date: 07/02/2022 UPPER VENOUS STUDY  Patient Name:  DEZEREE REDDIG  Date of Exam:   07/01/2022 Medical Rec #: [id number]             Accession #:    [id number] Date of Birth: [birth year]             Patient Gender: F Patient Age:   57 years Exam Location:  [facility] Procedure:      VAS [place] UPPER EXTREMITY VENOUS DUPLEX Referring Phys: RAMESH KC --------------------------------------------------------------------------------  Indications: Right edema Other Indications: Right subclavian tunneled hemodialysis catheter. Comparison Study: No prior studies. Performing Technologist: Jean Rosenthal RDMS, RVT  Examination Guidelines: A complete evaluation includes B-mode imaging, spectral Doppler, color Doppler, and power Doppler as needed  of all accessible portions of each vessel. Bilateral testing is considered an integral part of a complete examination. Limited examinations for reoccurring indications may be performed as noted.  Right Findings: +----------+------------+---------+-----------+----------+-------+ RIGHT     CompressiblePhasicitySpontaneousPropertiesSummary +----------+------------+---------+-----------+----------+-------+ IJV           Full       Yes       Yes                      +----------+------------+---------+-----------+----------+-------+ Subclavian               Yes       Yes                      +----------+------------+---------+-----------+----------+-------+ Axillary      Full       Yes       Yes                      +----------+------------+---------+-----------+----------+-------+ Brachial      Full                                          +----------+------------+---------+-----------+----------+-------+ Radial        Full                                          +----------+------------+---------+-----------+----------+-------+ Ulnar         Full                                          +----------+------------+---------+-----------+----------+-------+ Cephalic      Full                                          +----------+------------+---------+-----------+----------+-------+  Basilic       Full                                          +----------+------------+---------+-----------+----------+-------+  Left Findings: +----------+------------+---------+-----------+----------+---------------------+ LEFT      CompressiblePhasicitySpontaneousProperties       Summary        +----------+------------+---------+-----------+----------+---------------------+ Subclavian                                           Unable to evaluate-                                                       recent AVF, patient                                                         declining imaging                                                               left          +----------+------------+---------+-----------+----------+---------------------+  Summary:  Right: No evidence of deep vein thrombosis in the upper extremity. No evidence of superficial vein thrombosis in the upper extremity.  *See table(s) above for measurements and observations.  Diagnosing physician: Gerarda Fraction Electronically signed by Gerarda Fraction on 07/02/2022 at 4:32:42 PM.    Final    VAS [place] UPPER EXT VEIN MAPPING (PRE-OP AVF)  Result Date: 06/25/2022 UPPER EXTREMITY VEIN MAPPING Patient Name:  AALIVIA HANSER  Date of Exam:   06/25/2022 Medical Rec #: [id number]             Accession #:    [id number] Date of Birth: [birth year]             Patient Gender: F Patient Age:   26 years Exam Location:  [facility] Procedure:      VAS [place] UPPER EXT VEIN MAPPING (PRE-OP AVF) Referring Phys: Heath Lark --------------------------------------------------------------------------------  Indications: Pre-access. Limitations: Patient positioning and small diameter of vessels. Comparison Study: No previous study Performing Technologist: McKayla Maag RVT, VT  Examination Guidelines: A complete evaluation includes B-mode imaging, spectral Doppler, color Doppler, and power Doppler as needed of all accessible portions of each vessel. Bilateral testing is considered an integral part of a complete examination. Limited examinations for reoccurring indications may be performed as noted. +-----------------+-------------+----------+--------------+ Right Cephalic   Diameter (cm)Depth (cm)   Findings    +-----------------+-------------+----------+--------------+ Shoulder             0.19        1.34                  +-----------------+-------------+----------+--------------+ Prox upper arm       0.27  1.62                  +-----------------+-------------+----------+--------------+  Mid upper arm        0.33        0.99                  +-----------------+-------------+----------+--------------+ Dist upper arm       0.25        0.58                  +-----------------+-------------+----------+--------------+ Antecubital fossa    0.37        0.39                  +-----------------+-------------+----------+--------------+ Prox forearm         0.07        0.44                  +-----------------+-------------+----------+--------------+ Mid forearm                             not visualized +-----------------+-------------+----------+--------------+ Dist forearm                            not visualized +-----------------+-------------+----------+--------------+ Wrist                                   not visualized +-----------------+-------------+----------+--------------+ +-----------------+-------------+----------+--------------+ Right Basilic    Diameter (cm)Depth (cm)   Findings    +-----------------+-------------+----------+--------------+ Shoulder                                not visualized +-----------------+-------------+----------+--------------+ Prox upper arm                          not visualized +-----------------+-------------+----------+--------------+ Mid upper arm                           not visualized +-----------------+-------------+----------+--------------+ Dist upper arm                          not visualized +-----------------+-------------+----------+--------------+ Antecubital fossa    0.07        0.52                  +-----------------+-------------+----------+--------------+ Prox forearm         0.11        0.41                  +-----------------+-------------+----------+--------------+ Mid forearm                             not visualized +-----------------+-------------+----------+--------------+ Distal forearm                          not visualized  +-----------------+-------------+----------+--------------+ Elbow                                   not visualized +-----------------+-------------+----------+--------------+ Wrist  not visualized +-----------------+-------------+----------+--------------+ +-----------------+-------------+----------+---------+ Left Cephalic    Diameter (cm)Depth (cm)Findings  +-----------------+-------------+----------+---------+ Shoulder             0.44        2.10             +-----------------+-------------+----------+---------+ Prox upper arm       0.36        1.82             +-----------------+-------------+----------+---------+ Mid upper arm        0.36        1.36             +-----------------+-------------+----------+---------+ Dist upper arm       0.34        1.11   branching +-----------------+-------------+----------+---------+ Antecubital fossa    0.37        0.81             +-----------------+-------------+----------+---------+ Prox forearm                            thrombus  +-----------------+-------------+----------+---------+ Mid forearm                             thrombus  +-----------------+-------------+----------+---------+ Dist forearm                            thrombus  +-----------------+-------------+----------+---------+ +-----------------+-------------+----------+--------------+ Left Basilic     Diameter (cm)Depth (cm)   Findings    +-----------------+-------------+----------+--------------+ Shoulder                                not visualized +-----------------+-------------+----------+--------------+ Prox upper arm                          not visualized +-----------------+-------------+----------+--------------+ Mid upper arm                           not visualized +-----------------+-------------+----------+--------------+ Dist upper arm                          not visualized  +-----------------+-------------+----------+--------------+ Antecubital fossa                       not visualized +-----------------+-------------+----------+--------------+ Prox forearm         0.18        0.37                  +-----------------+-------------+----------+--------------+ Mid forearm          0.15        0.19                  +-----------------+-------------+----------+--------------+ Distal forearm       0.11        0.29                  +-----------------+-------------+----------+--------------+ Wrist                                   not visualized +-----------------+-------------+----------+--------------+ Acute superficial thrombus formation in the left cephalic vein proximal forearm to distal forearm. *See table(s) above for measurements and  observations.  Diagnosing physician: Sherald Hess MD Electronically signed by Sherald Hess MD on 06/25/2022 at 4:19:26 PM.    Final    IR Fluoro Guide CV Line Right  Result Date: 06/24/2022 INDICATION: 57 year old female referred for tunneled hemodialysis catheter EXAM: TUNNELED CENTRAL VENOUS HEMODIALYSIS CATHETER PLACEMENT WITH ULTRASOUND AND FLUOROSCOPIC GUIDANCE MEDICATIONS: 1 g vancomycin. The antibiotic was given in an appropriate time interval prior to skin puncture. ANESTHESIA/SEDATION: Moderate (conscious) sedation was not employed during this procedure. A total of Versed 0.5 mg and Fentanyl 0 mcg was administered intravenously by the radiology nurse. Total intra-service moderate Sedation Time: 0 minutes. The patient's level of consciousness and vital signs were monitored continuously by radiology nursing throughout the procedure under my direct supervision. FLUOROSCOPY: Radiation Exposure Index (as provided by the fluoroscopic device): 6 mGy Kerma COMPLICATIONS: None PROCEDURE: Informed written consent was obtained from the patient after a discussion of the risks, benefits, and alternatives to treatment.  Questions regarding the procedure were encouraged and answered. The right neck and chest were prepped with chlorhexidine in a sterile fashion, and a sterile drape was applied covering the operative field. Maximum barrier sterile technique with sterile gowns and gloves were used for the procedure. A timeout was performed prior to the initiation of the procedure. Ultrasound survey was performed. The right internal jugular vein was confirmed to be patent, with images stored and sent to PACS. Micropuncture kit was utilized to access the right internal jugular vein under direct, real-time ultrasound guidance after the overlying soft tissues were anesthetized with 1% lidocaine with epinephrine. Stab incision was made with 11 blade scalpel. Microwire was passed centrally. The microwire was then marked to measure appropriate internal catheter length. External tunneled length was estimated. A total tip to cuff length of 23 cm was selected. 035 guidewire was advanced to the level of the IVC. Skin and subcutaneous tissues of chest wall below the clavicle were generously infiltrated with 1% lidocaine for local anesthesia. A small stab incision was made with 11 blade scalpel. The selected hemodialysis catheter was tunneled in a retrograde fashion from the anterior chest wall to the venotomy incision. Serial dilation was performed and then a peel-away sheath was placed. The catheter was then placed through the peel-away sheath with tips ultimately positioned within the superior aspect of the right atrium. Final catheter positioning was confirmed and documented with a spot radiographic image. The catheter aspirates and flushes normally. The catheter was flushed with appropriate volume heparin dwells. The catheter exit site was secured with a 0-Prolene retention suture. Gel-Foam slurry was infused into the soft tissue tract. The venotomy incision was closed Derma bond and sterile dressing. Dressings were applied at the chest wall.  Patient tolerated the procedure well and remained hemodynamically stable throughout. No complications were encountered and no significant blood loss encountered. IMPRESSION: Status post image guided right IJ tunneled hemodialysis catheter. Signed, Yvone Neu. Miachel Roux, RPVI [facility] Electronically Signed   By: Gilmer Mor D.O.   On: 06/24/2022 17:23   IR US Guide Vasc Access Right  Result Date: 06/24/2022 INDICATION: 57 year old female referred for tunneled hemodialysis catheter EXAM: TUNNELED CENTRAL VENOUS HEMODIALYSIS CATHETER PLACEMENT WITH ULTRASOUND AND FLUOROSCOPIC GUIDANCE MEDICATIONS: 1 g vancomycin. The antibiotic was given in an appropriate time interval prior to skin puncture. ANESTHESIA/SEDATION: Moderate (conscious) sedation was not employed during this procedure. A total of Versed 0.5 mg and Fentanyl 0 mcg was administered intravenously by the radiology nurse. Total intra-service moderate Sedation  Time: 0 minutes. The patient's level of consciousness and vital signs were monitored continuously by radiology nursing throughout the procedure under my direct supervision. FLUOROSCOPY: Radiation Exposure Index (as provided by the fluoroscopic device): 6 mGy Kerma COMPLICATIONS: None PROCEDURE: Informed written consent was obtained from the patient after a discussion of the risks, benefits, and alternatives to treatment. Questions regarding the procedure were encouraged and answered. The right neck and chest were prepped with chlorhexidine in a sterile fashion, and a sterile drape was applied covering the operative field. Maximum barrier sterile technique with sterile gowns and gloves were used for the procedure. A timeout was performed prior to the initiation of the procedure. Ultrasound survey was performed. The right internal jugular vein was confirmed to be patent, with images stored and sent to PACS. Micropuncture kit was utilized  to access the right internal jugular vein under direct, real-time ultrasound guidance after the overlying soft tissues were anesthetized with 1% lidocaine with epinephrine. Stab incision was made with 11 blade scalpel. Microwire was passed centrally. The microwire was then marked to measure appropriate internal catheter length. External tunneled length was estimated. A total tip to cuff length of 23 cm was selected. 035 guidewire was advanced to the level of the IVC. Skin and subcutaneous tissues of chest wall below the clavicle were generously infiltrated with 1% lidocaine for local anesthesia. A small stab incision was made with 11 blade scalpel. The selected hemodialysis catheter was tunneled in a retrograde fashion from the anterior chest wall to the venotomy incision. Serial dilation was performed and then a peel-away sheath was placed. The catheter was then placed through the peel-away sheath with tips ultimately positioned within the superior aspect of the right atrium. Final catheter positioning was confirmed and documented with a spot radiographic image. The catheter aspirates and flushes normally. The catheter was flushed with appropriate volume heparin dwells. The catheter exit site was secured with a 0-Prolene retention suture. Gel-Foam slurry was infused into the soft tissue tract. The venotomy incision was closed Derma bond and sterile dressing. Dressings were applied at the chest wall. Patient tolerated the procedure well and remained hemodynamically stable throughout. No complications were encountered and no significant blood loss encountered. IMPRESSION: Status post image guided right IJ tunneled hemodialysis catheter. Signed, Yvone Neu. Miachel Roux, RPVI [facility] Radiology Electronically Signed   By: Gilmer Mor D.O.   On: 06/24/2022 17:23    Microbiology: No results found for this or any previous visit (from the past 240 hour(s)).    Labs: Basic Metabolic Panel: Recent Labs  Lab 07/13/22 1550 07/13/22 1855 07/14/22 0302  NA 136 136 136  K 3.2* 3.6 2.8*  CL 99 101 98  CO2 24 22 28   GLUCOSE 145* 120* 113*  BUN 30* 31* 12  CREATININE 5.92* 5.73* 2.98*  CALCIUM 8.4* 8.3* 8.0*  PHOS  --  4.5  --    Liver Function Tests: Recent Labs  Lab 07/13/22 1855  ALBUMIN 2.2*   No results for input(s): "LIPASE", "AMYLASE" in the last 168 hours. No results for input(s): "AMMONIA" in the last 168 hours. CBC: Recent Labs  Lab 07/13/22 1550 07/13/22 1855 07/14/22 0302  WBC 12.0* 13.2* 13.6*  NEUTROABS 8.9*  --   --   HGB 8.7* 7.5* 7.3*  HCT 29.1* 23.6* 23.0*  MCV 92.7 90.4 89.1  PLT 568* 486* 512*   Cardiac Enzymes: No results for input(s): "CKTOTAL", "CKMB", "CKMBINDEX", "TROPONINI" in the last 168 hours. BNP:  BNP (last 3 results) Recent Labs    06/02/22 0633 06/03/22 0432 06/18/22 1817  BNP 628.4* 384.8* 267.3*    ProBNP (last 3 results) No results for input(s): "PROBNP" in the last 8760 hours.  CBG: Recent Labs  Lab 07/13/22 1840 07/13/22 2038 07/14/22 0903 07/14/22 1147  GLUCAP 111* 158* 104* 123*       Signed:  Zannie Cove MD.  [facility] 07/20/2022, 1:55 PM

## 2022-07-24 LAB — DRUG TOX MONITOR 1 W/CONF, ORAL FLD
Amphetamines: NEGATIVE ng/mL
Barbiturates: NEGATIVE ng/mL
Benzodiazepines: NEGATIVE ng/mL
Buprenorphine: NEGATIVE ng/mL
Cocaine: NEGATIVE ng/mL
Codeine: NEGATIVE ng/mL
Dihydrocodeine: NEGATIVE ng/mL
Fentanyl: NEGATIVE ng/mL
Heroin Metabolite: NEGATIVE ng/mL
Hydrocodone: NEGATIVE ng/mL
Hydromorphone: 29.2 ng/mL — ABNORMAL HIGH
MARIJUANA: NEGATIVE ng/mL
MDMA: NEGATIVE ng/mL
Meprobamate: NEGATIVE ng/mL
Methadone: NEGATIVE ng/mL
Morphine: NEGATIVE ng/mL
Nicotine Metabolite: NEGATIVE ng/mL
Norhydrocodone: NEGATIVE ng/mL
Noroxycodone: NEGATIVE ng/mL
Opiates: POSITIVE ng/mL — AB
Oxycodone: NEGATIVE ng/mL
Oxymorphone: NEGATIVE ng/mL
Phencyclidine: NEGATIVE ng/mL
Tapentadol: NEGATIVE ng/mL
Tramadol: NEGATIVE ng/mL
Zolpidem: NEGATIVE ng/mL

## 2022-07-24 LAB — DRUG TOX ALC METAB W/CON, ORAL FLD: Alcohol Metabolite: NEGATIVE ng/mL (ref <25)

## 2022-07-26 ENCOUNTER — Telehealth: Payer: Self-pay | Admitting: Registered Nurse

## 2022-07-26 MED ORDER — HYDROMORPHONE HCL 4 MG PO TABS: 4.0000 mg | ORAL_TABLET | Freq: Three times a day (TID) | ORAL | 0 refills | Status: DC | PRN

## 2022-07-26 NOTE — Telephone Encounter (Signed)
[name] was Reviewed.  Oral Swab was Reviewed.  Call Placed to Ms. Bethann Goo, Dilaudid e-scribed to pharmacy. Ms.Mauch is aware of the above and verbalizes understanding.

## 2022-08-04 ENCOUNTER — Other Ambulatory Visit: Payer: Self-pay | Admitting: *Deleted

## 2022-08-04 DIAGNOSIS — N184 Chronic kidney disease, stage 4 (severe): Secondary | ICD-10-CM

## 2022-08-05 ENCOUNTER — Ambulatory Visit: Payer: 59 | Admitting: Podiatry

## 2022-08-10 ENCOUNTER — Encounter (HOSPITAL_COMMUNITY): Payer: 59

## 2022-08-12 ENCOUNTER — Telehealth: Payer: Self-pay | Admitting: *Deleted

## 2022-08-12 NOTE — Telephone Encounter (Signed)
Oral swab drug screen was consistent for prescribed medications.  ?

## 2022-08-23 ENCOUNTER — Telehealth: Payer: Self-pay

## 2022-08-23 MED ORDER — HYDROMORPHONE HCL 4 MG PO TABS: 4.0000 mg | ORAL_TABLET | Freq: Three times a day (TID) | ORAL | 0 refills | Status: DC | PRN

## 2022-08-23 NOTE — Telephone Encounter (Signed)
Filled  Written  ID  Drug  QTY  Days  Prescriber  RX #  Dispenser  Refill  Daily Dose*  Pymt Type  [name]  07/26/2022 07/26/2022 1  Hydromorphone 4 Mg Tablet 80.00 30 Eu Tho [id number] Sum (4754) 0/0 53.33 MME Medicare [place] 07/05/2022 07/05/2022 4  Hydromorphone 4 Mg Tablet 12.00 3 Ja Sam [id number] Mos (5648) 0/0 80.00 MME Other [place] 06/08/2022 06/08/2022 3  Hydromorphone 2 Mg Tablet 90.00 30 Pr Sin [id number] Pha (4739) 0/99 30.00 MME Private Pay [place]  Hydromorphone 4 MG requested to be sent to [facility].

## 2022-08-23 NOTE — Telephone Encounter (Signed)
[name] was Reviewed Call placed to Ms. Bethann Goo, Ms. Charlemagne answered the phone, she wasn't able to hear this provider. I called her back and they werent able to hear this provider.  Dilaudid prescription e-scribed to pharmacy. She has a scheduled appointment for July.

## 2022-08-29 IMAGING — DX DG CHEST 1V PORT
2 series · 2 of 2 positions shown · non-contrast
Comparison: 05/25/2020

CLINICAL DATA: Nausea vomiting abdominal pain

EXAM:
PORTABLE CHEST 1 VIEW

[chest ap (1 of 2)]
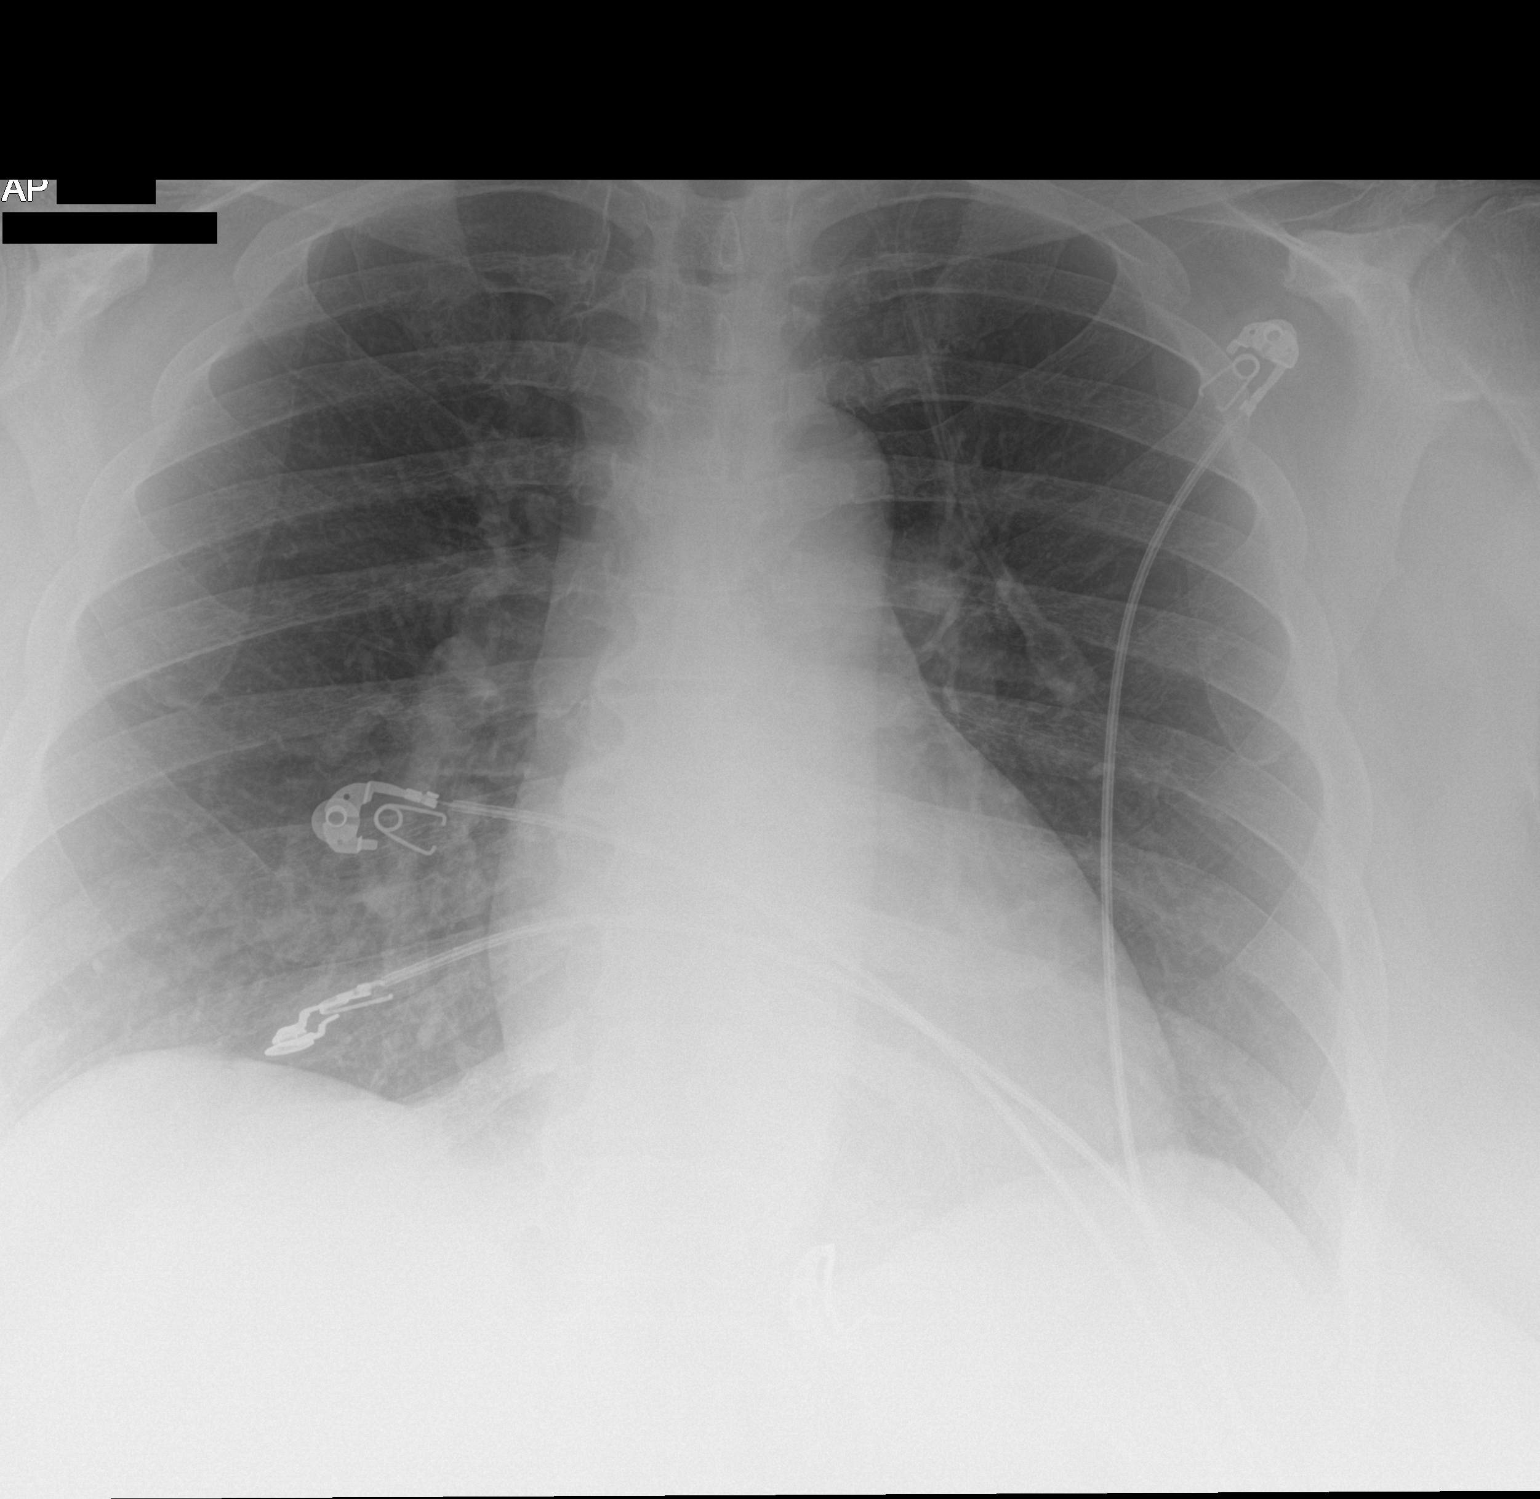

[chest ap (2 of 2)]
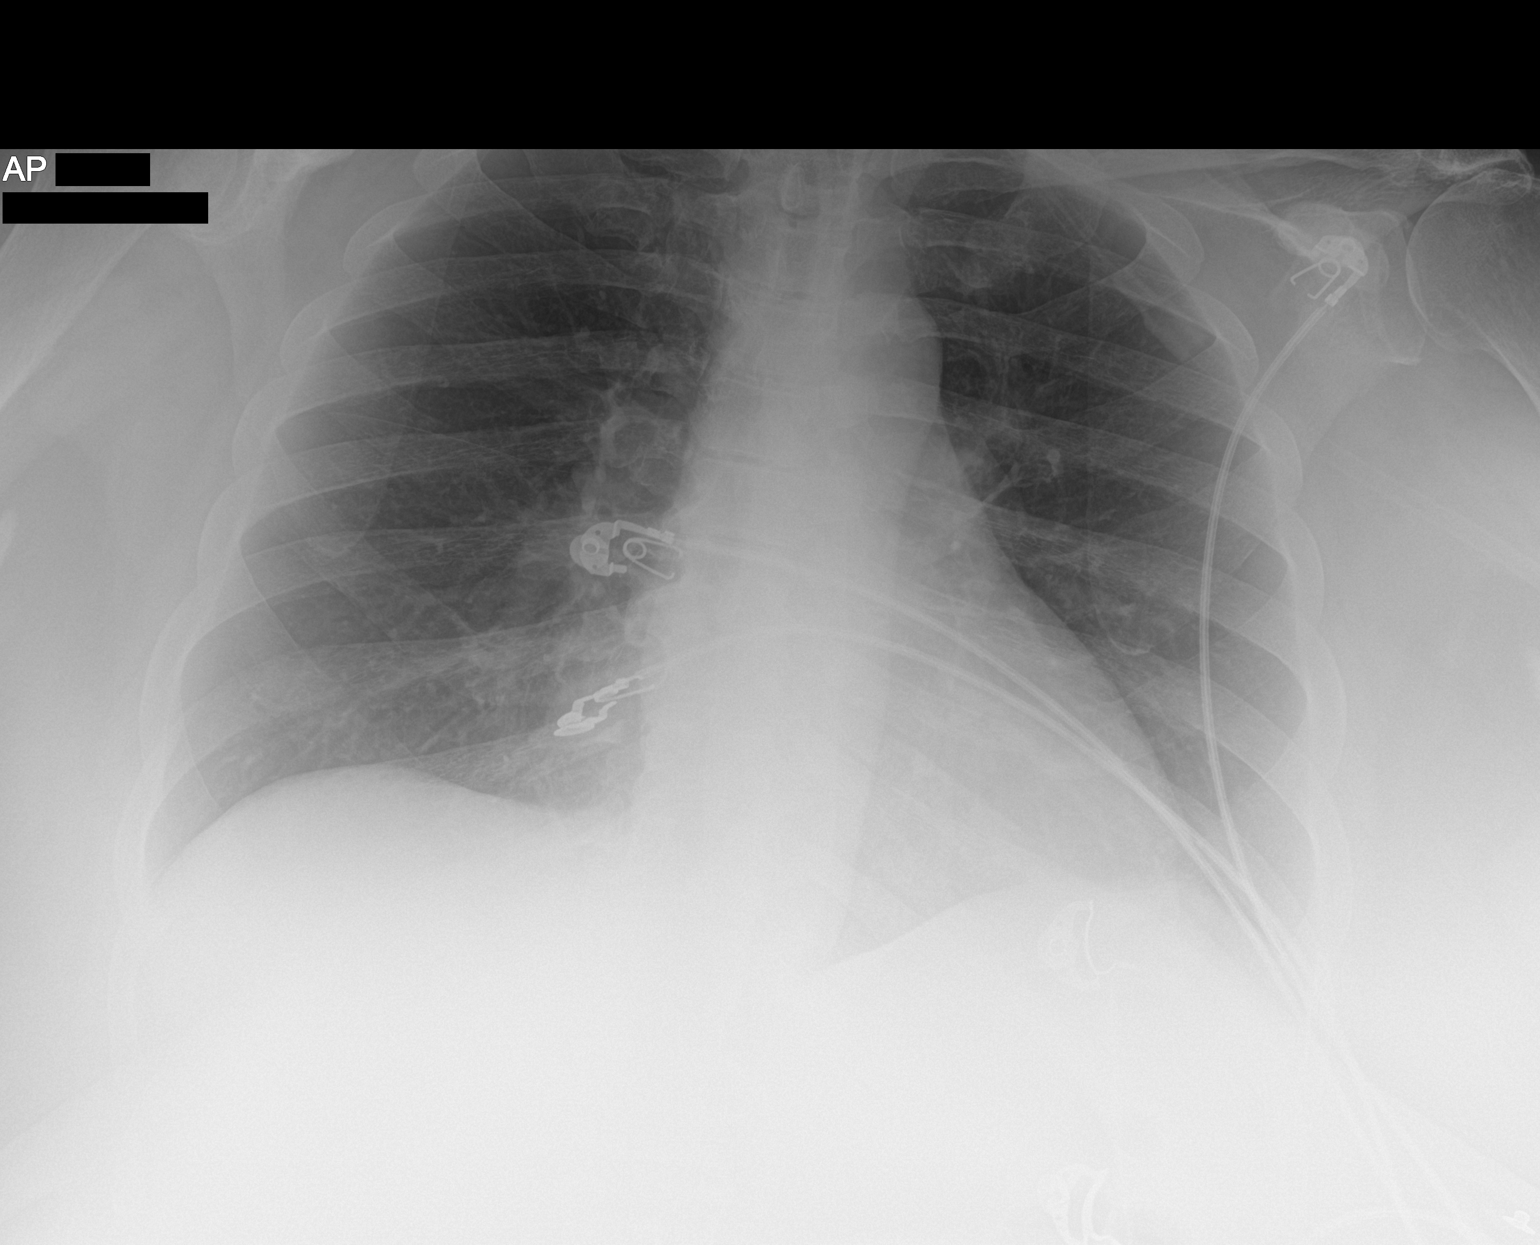

[2 of 2 positions shown; findings below may reference images not displayed]

FINDINGS: The heart size and mediastinal contours are within normal limits.
Both lungs are clear. The visualized skeletal structures are
unremarkable.
IMPRESSION: No active disease.

## 2022-08-30 ENCOUNTER — Other Ambulatory Visit: Payer: Self-pay | Admitting: Physical Medicine and Rehabilitation

## 2022-08-31 IMAGING — CT CT ABD-PELV W/O CM
2 of 4 series · 17 of 46 positions shown, 19 images · non-contrast
Comparison: 05/29/2020

CLINICAL DATA: 55-year-old with acute abdominal pain.

EXAM:
CT ABDOMEN AND PELVIS WITHOUT CONTRAST
TECHNIQUE: Multidetector CT imaging of the abdomen and pelvis was performed
following the standard protocol without IV contrast.

[Series 2: axial st · axial · 0.98mm/px · z∈[+1115,+1540]mm · 14 of 95 slices shown, 16 images]
[im 5/95  soft-tissue]
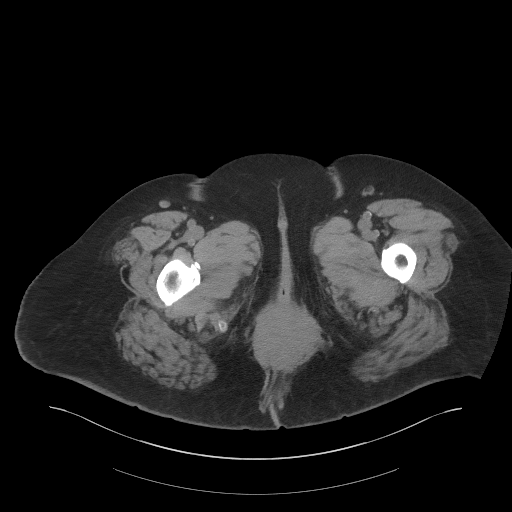
[im 5/95  bone]
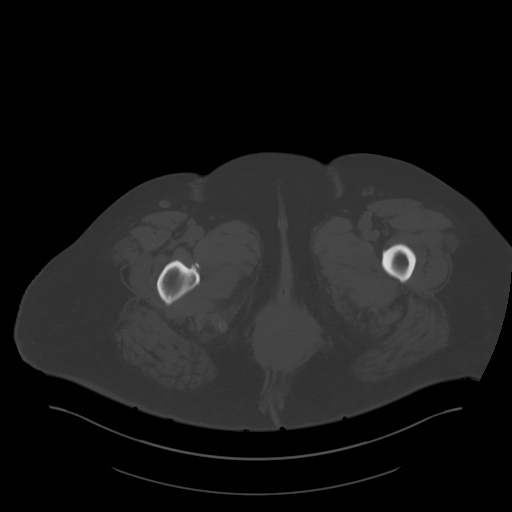
[im 15/95  soft-tissue]
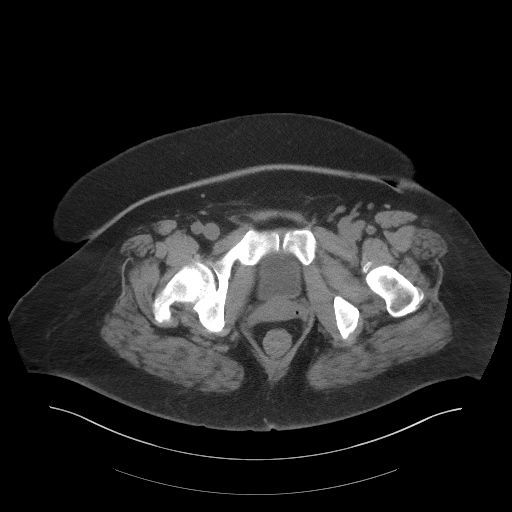
[im 19/95  soft-tissue]
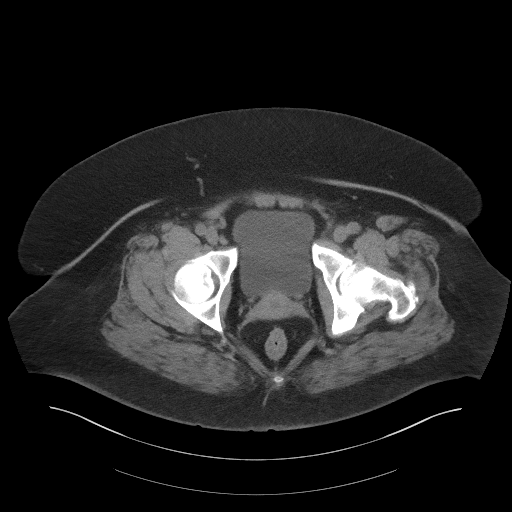
[im 24/95  soft-tissue]
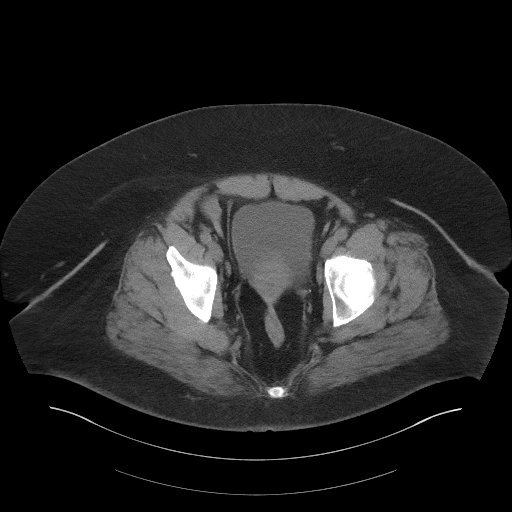
[im 33/95  soft-tissue]
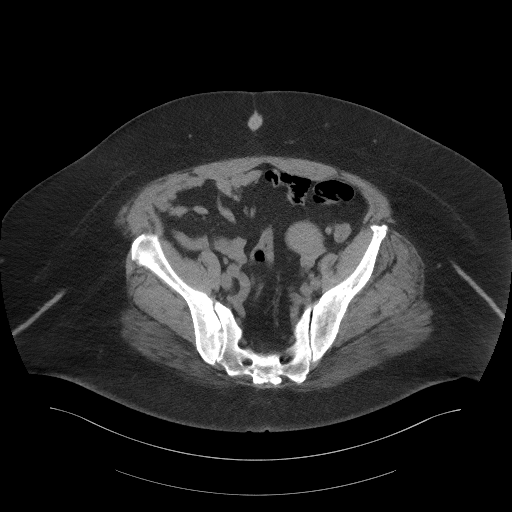
[im 38/95  soft-tissue]
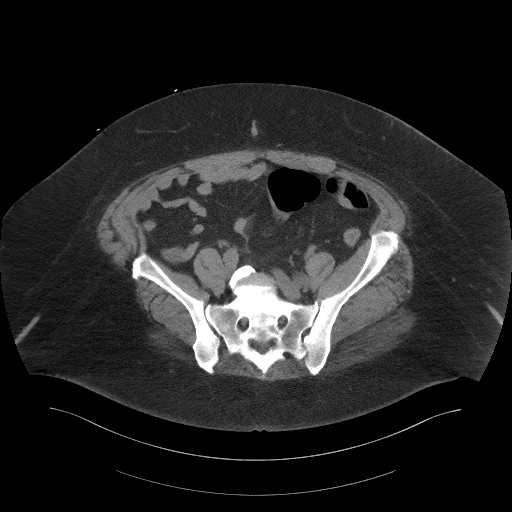
[im 43/95  soft-tissue]
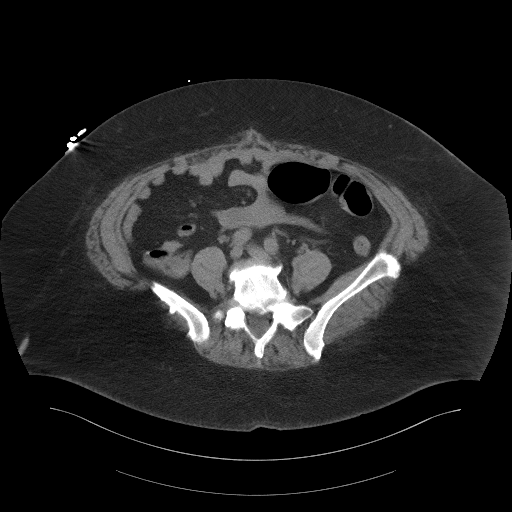
[im 52/95  soft-tissue]
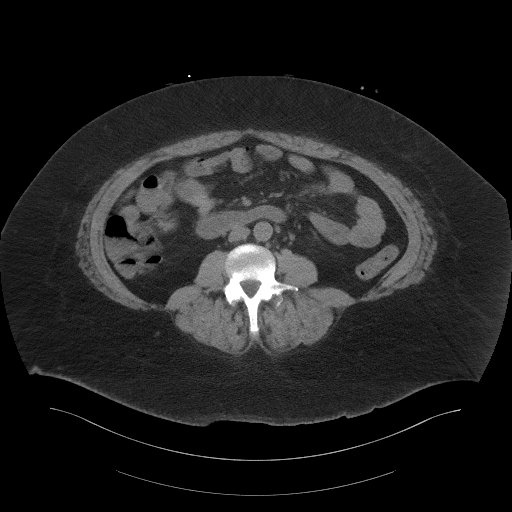
[im 57/95  soft-tissue]
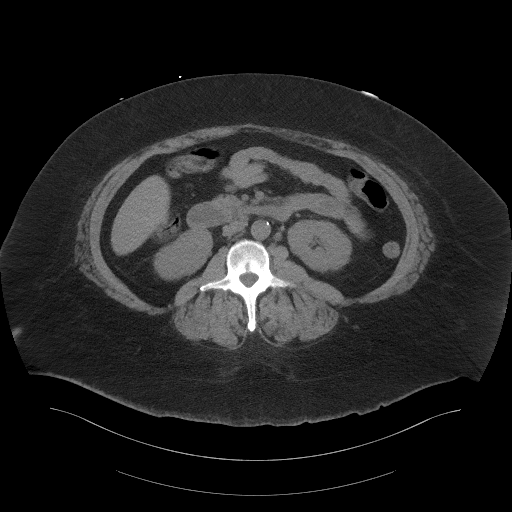
[im 57/95  bone]
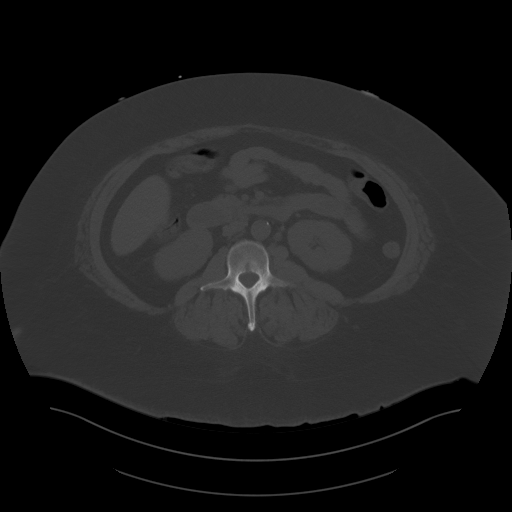
[im 62/95  soft-tissue]
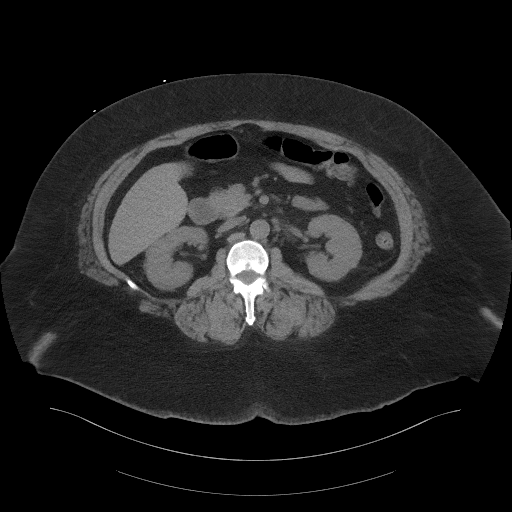
[im 71/95  soft-tissue]
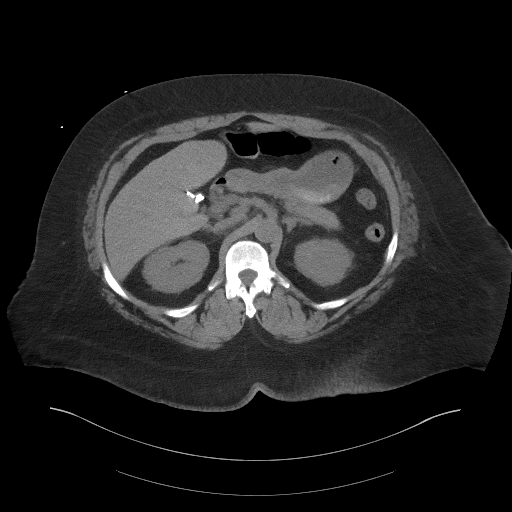
[im 76/95  soft-tissue]
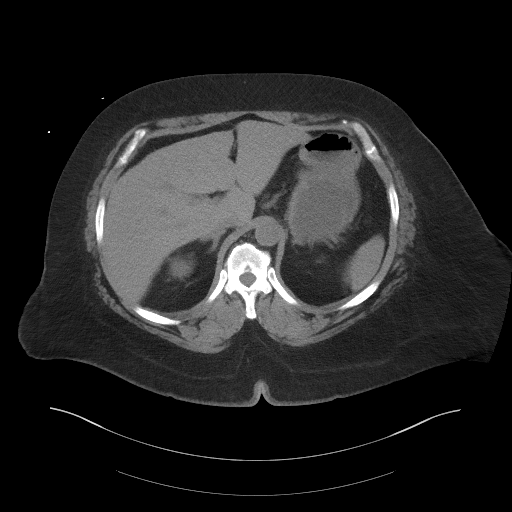
[im 80/95  soft-tissue]
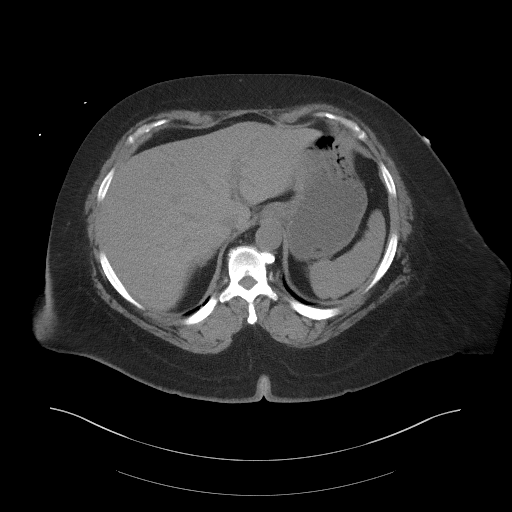
[im 90/95  soft-tissue]
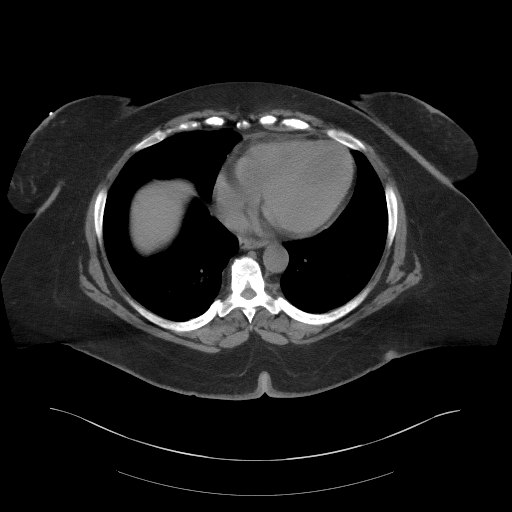

[Series 5: coronal st · coronal · 0.92mm/px · 3 of 114 slices shown]
[im 38/114  soft-tissue]
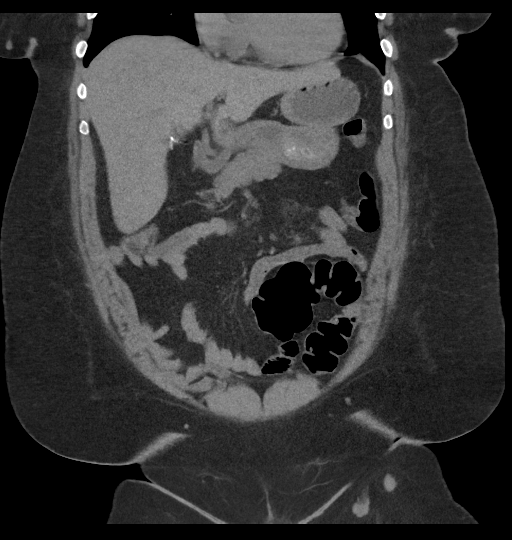
[im 51/114  soft-tissue]
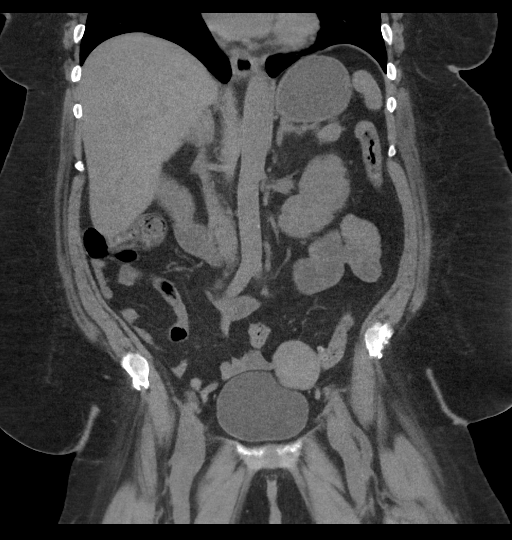
[im 63/114  soft-tissue]
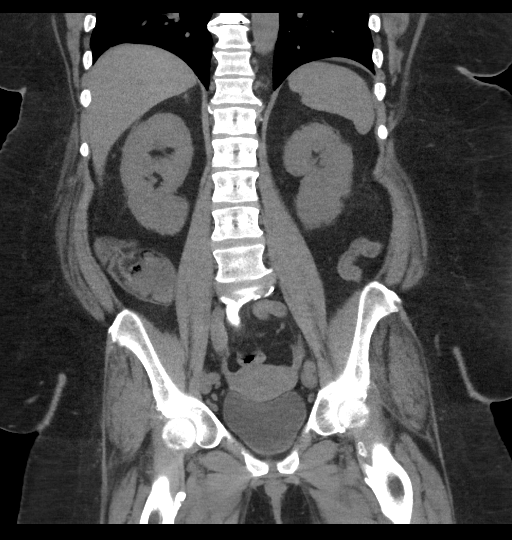

[17 of 46 positions shown; findings below may reference images not displayed]

FINDINGS: Lower chest: Patchy densities in the right lower lobe are suggestive
for atelectasis. No large pleural effusions.

Hepatobiliary: Gallbladder has been removed. No focal abnormality to
the liver. No significant biliary dilatation.

Pancreas: Unremarkable. No pancreatic ductal dilatation or
surrounding inflammatory changes.

Spleen: Normal in size without focal abnormality.

Adrenals/Urinary Tract: Normal appearance of the adrenal glands.
Normal appearance of the urinary bladder. Negative for kidney stones
or hydronephrosis.

Stomach/Bowel: Stomach is within normal limits. No evidence of bowel
wall thickening, distention, or inflammatory changes.

Vascular/Lymphatic: Small amount of wall calcifications involving
the abdominal aorta without aneurysm. No significant lymph node
enlargement in the abdomen or pelvis.

Reproductive: Again noted is a rounded structure in the region of
the left adnexa that measures up to 4.6 cm and stable. This was
described as an exophytic fibroid on previous MRI from 11/29/2017.
Otherwise, stable appearance of the uterus and adnexal structures.

Other: Negative for ascites.  Negative for free air.

Musculoskeletal: Significant degenerative facet disease in lower
lumbar spine.
IMPRESSION: 1. No acute abnormality in the abdomen or pelvis.
2. Cholecystectomy.
3.  Aortic Atherosclerosis ([id number]).

## 2022-09-01 IMAGING — DX DG CHEST 1V PORT
1 series · 1 of 1 positions shown · non-contrast
Comparison: 07/20/2020

CLINICAL DATA: Chest pain

EXAM:
PORTABLE CHEST 1 VIEW

[chest ap]
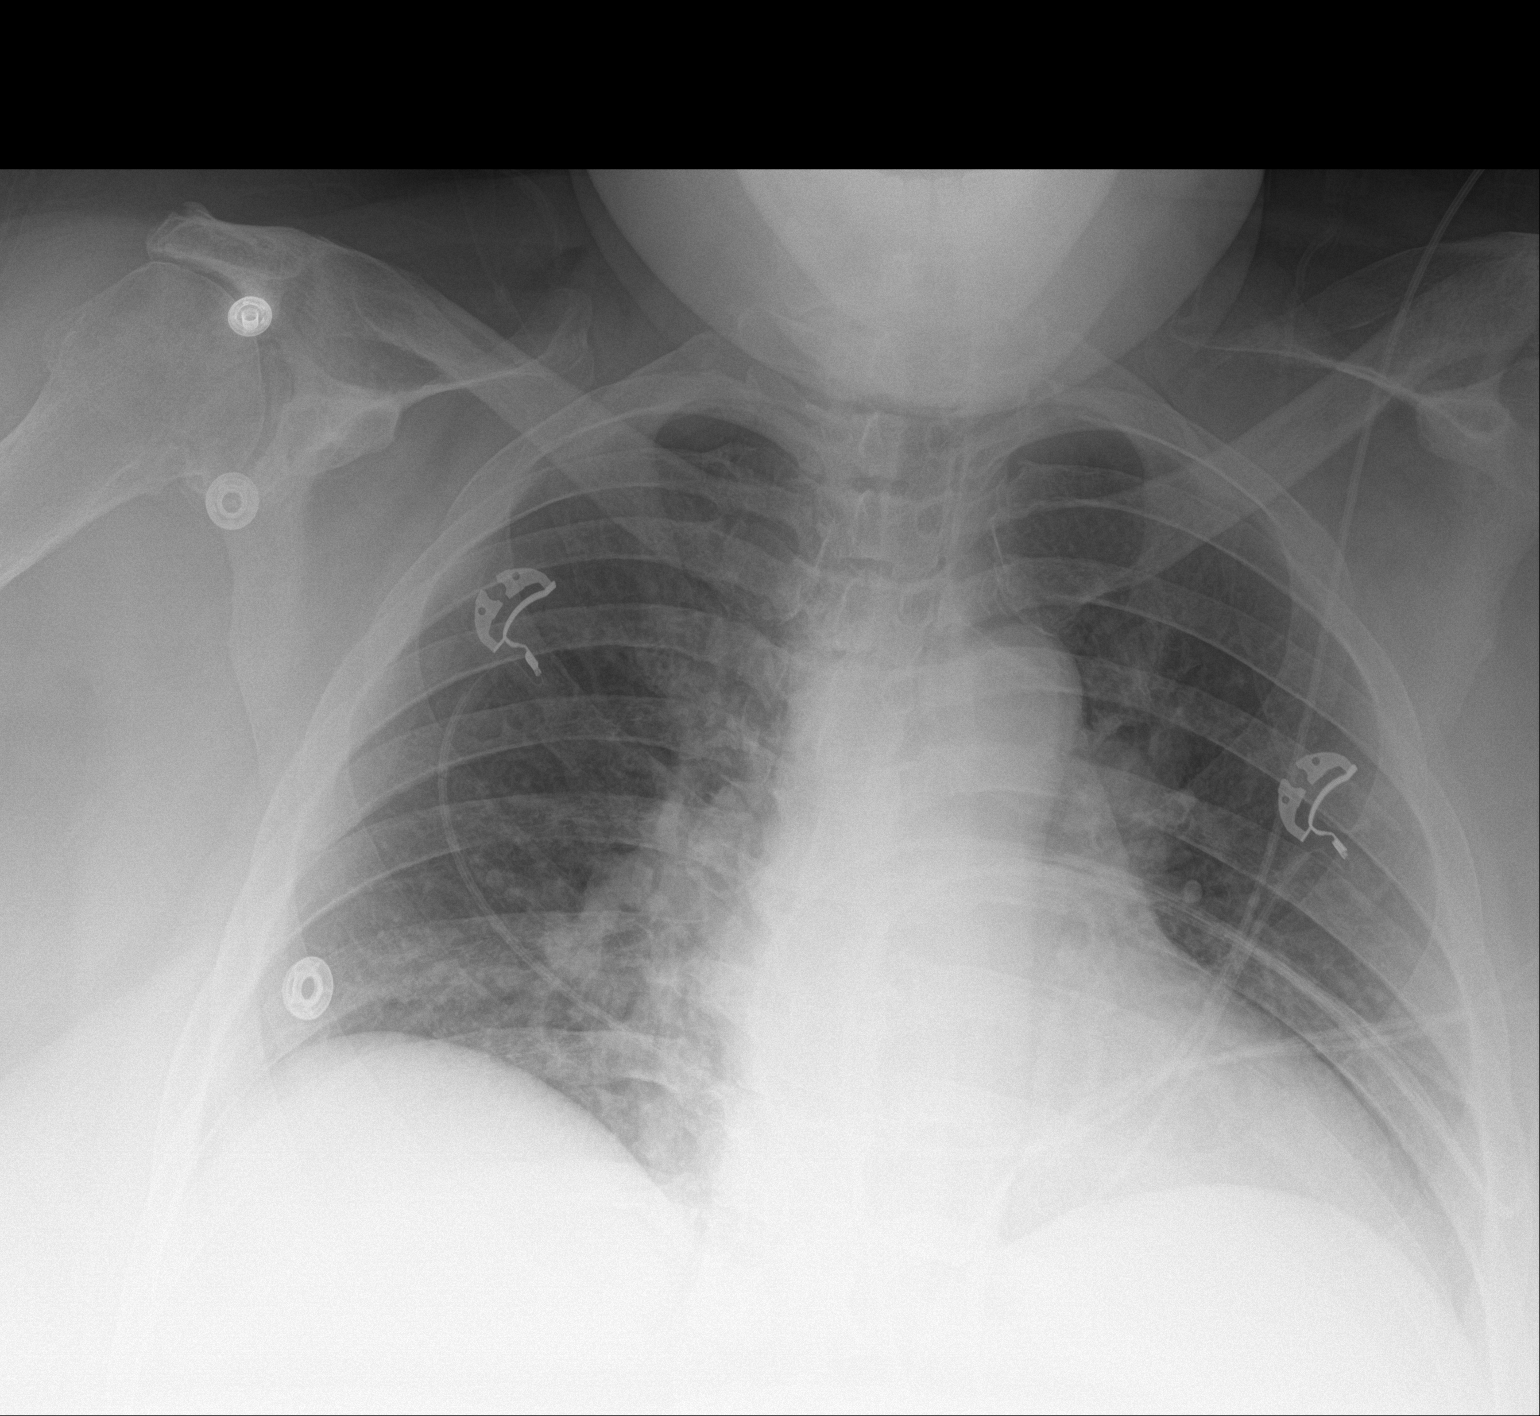

[1 of 1 positions shown; findings below may reference images not displayed]

FINDINGS: Cardiac shadow is mildly prominent but stable. Slight increased
vascular congestion is noted without interstitial edema. No bony
abnormality is noted.
IMPRESSION: Mild increased vascular congestion.  No edema is noted.

## 2022-09-07 ENCOUNTER — Ambulatory Visit (HOSPITAL_COMMUNITY): Payer: 59 | Attending: Physician Assistant

## 2022-09-09 ENCOUNTER — Inpatient Hospital Stay (HOSPITAL_COMMUNITY)
Admission: EM | Admit: 2022-09-09 | Discharge: 2022-09-10 | DRG: 308 | Discharge status: Against Medical Advice | Payer: 59 | Attending: Internal Medicine | Admitting: Internal Medicine

## 2022-09-09 ENCOUNTER — Other Ambulatory Visit: Payer: Self-pay

## 2022-09-09 ENCOUNTER — Emergency Department (HOSPITAL_COMMUNITY): Payer: 59

## 2022-09-09 ENCOUNTER — Encounter (HOSPITAL_COMMUNITY): Payer: Self-pay | Admitting: Internal Medicine

## 2022-09-09 DIAGNOSIS — I482 Chronic atrial fibrillation, unspecified: Secondary | ICD-10-CM

## 2022-09-09 DIAGNOSIS — K219 Gastro-esophageal reflux disease without esophagitis: Secondary | ICD-10-CM | POA: Diagnosis present

## 2022-09-09 DIAGNOSIS — I959 Hypotension, unspecified: Secondary | ICD-10-CM | POA: Diagnosis present

## 2022-09-09 DIAGNOSIS — Z880 Allergy status to penicillin: Secondary | ICD-10-CM

## 2022-09-09 DIAGNOSIS — E1165 Type 2 diabetes mellitus with hyperglycemia: Secondary | ICD-10-CM | POA: Diagnosis present

## 2022-09-09 DIAGNOSIS — I5032 Chronic diastolic (congestive) heart failure: Secondary | ICD-10-CM

## 2022-09-09 DIAGNOSIS — D631 Anemia in chronic kidney disease: Secondary | ICD-10-CM | POA: Diagnosis present

## 2022-09-09 DIAGNOSIS — K3184 Gastroparesis: Secondary | ICD-10-CM | POA: Diagnosis present

## 2022-09-09 DIAGNOSIS — Z79899 Other long term (current) drug therapy: Secondary | ICD-10-CM

## 2022-09-09 DIAGNOSIS — E1122 Type 2 diabetes mellitus with diabetic chronic kidney disease: Secondary | ICD-10-CM | POA: Diagnosis present

## 2022-09-09 DIAGNOSIS — Z765 Malingerer [conscious simulation]: Secondary | ICD-10-CM

## 2022-09-09 DIAGNOSIS — G894 Chronic pain syndrome: Secondary | ICD-10-CM | POA: Diagnosis present

## 2022-09-09 DIAGNOSIS — R112 Nausea with vomiting, unspecified: Secondary | ICD-10-CM | POA: Diagnosis not present

## 2022-09-09 DIAGNOSIS — I2489 Other forms of acute ischemic heart disease: Secondary | ICD-10-CM | POA: Diagnosis present

## 2022-09-09 DIAGNOSIS — I5042 Chronic combined systolic (congestive) and diastolic (congestive) heart failure: Secondary | ICD-10-CM | POA: Diagnosis present

## 2022-09-09 DIAGNOSIS — Z6841 Body Mass Index (BMI) 40.0 and over, adult: Secondary | ICD-10-CM | POA: Diagnosis not present

## 2022-09-09 DIAGNOSIS — Z7901 Long term (current) use of anticoagulants: Secondary | ICD-10-CM

## 2022-09-09 DIAGNOSIS — I10 Essential (primary) hypertension: Secondary | ICD-10-CM

## 2022-09-09 DIAGNOSIS — E1143 Type 2 diabetes mellitus with diabetic autonomic (poly)neuropathy: Secondary | ICD-10-CM | POA: Diagnosis present

## 2022-09-09 DIAGNOSIS — I5030 Unspecified diastolic (congestive) heart failure: Secondary | ICD-10-CM

## 2022-09-09 DIAGNOSIS — M797 Fibromyalgia: Secondary | ICD-10-CM | POA: Diagnosis present

## 2022-09-09 DIAGNOSIS — Z992 Dependence on renal dialysis: Secondary | ICD-10-CM | POA: Diagnosis not present

## 2022-09-09 DIAGNOSIS — E876 Hypokalemia: Secondary | ICD-10-CM | POA: Diagnosis present

## 2022-09-09 DIAGNOSIS — N186 End stage renal disease: Secondary | ICD-10-CM | POA: Diagnosis present

## 2022-09-09 DIAGNOSIS — D519 Vitamin B12 deficiency anemia, unspecified: Secondary | ICD-10-CM | POA: Diagnosis present

## 2022-09-09 DIAGNOSIS — Z888 Allergy status to other drugs, medicaments and biological substances status: Secondary | ICD-10-CM

## 2022-09-09 DIAGNOSIS — I428 Other cardiomyopathies: Secondary | ICD-10-CM | POA: Diagnosis present

## 2022-09-09 DIAGNOSIS — Z91041 Radiographic dye allergy status: Secondary | ICD-10-CM

## 2022-09-09 DIAGNOSIS — Z794 Long term (current) use of insulin: Secondary | ICD-10-CM | POA: Diagnosis not present

## 2022-09-09 DIAGNOSIS — Z85068 Personal history of other malignant neoplasm of small intestine: Secondary | ICD-10-CM

## 2022-09-09 DIAGNOSIS — E119 Type 2 diabetes mellitus without complications: Secondary | ICD-10-CM | POA: Diagnosis not present

## 2022-09-09 DIAGNOSIS — I132 Hypertensive heart and chronic kidney disease with heart failure and with stage 5 chronic kidney disease, or end stage renal disease: Secondary | ICD-10-CM | POA: Diagnosis present

## 2022-09-09 DIAGNOSIS — D649 Anemia, unspecified: Secondary | ICD-10-CM | POA: Diagnosis not present

## 2022-09-09 DIAGNOSIS — Z833 Family history of diabetes mellitus: Secondary | ICD-10-CM

## 2022-09-09 DIAGNOSIS — E785 Hyperlipidemia, unspecified: Secondary | ICD-10-CM | POA: Diagnosis present

## 2022-09-09 DIAGNOSIS — Z886 Allergy status to analgesic agent status: Secondary | ICD-10-CM

## 2022-09-09 DIAGNOSIS — Z8673 Personal history of transient ischemic attack (TIA), and cerebral infarction without residual deficits: Secondary | ICD-10-CM

## 2022-09-09 DIAGNOSIS — Z91158 Patient's noncompliance with renal dialysis for other reason: Secondary | ICD-10-CM

## 2022-09-09 DIAGNOSIS — G629 Polyneuropathy, unspecified: Secondary | ICD-10-CM

## 2022-09-09 DIAGNOSIS — R109 Unspecified abdominal pain: Principal | ICD-10-CM

## 2022-09-09 DIAGNOSIS — N2581 Secondary hyperparathyroidism of renal origin: Secondary | ICD-10-CM | POA: Diagnosis present

## 2022-09-09 DIAGNOSIS — I4891 Unspecified atrial fibrillation: Secondary | ICD-10-CM | POA: Diagnosis not present

## 2022-09-09 DIAGNOSIS — Z993 Dependence on wheelchair: Secondary | ICD-10-CM

## 2022-09-09 DIAGNOSIS — G4700 Insomnia, unspecified: Secondary | ICD-10-CM | POA: Diagnosis present

## 2022-09-09 DIAGNOSIS — R7989 Other specified abnormal findings of blood chemistry: Secondary | ICD-10-CM

## 2022-09-09 DIAGNOSIS — Z881 Allergy status to other antibiotic agents status: Secondary | ICD-10-CM

## 2022-09-09 DIAGNOSIS — Z8249 Family history of ischemic heart disease and other diseases of the circulatory system: Secondary | ICD-10-CM

## 2022-09-09 DIAGNOSIS — Z885 Allergy status to narcotic agent status: Secondary | ICD-10-CM

## 2022-09-09 DIAGNOSIS — M109 Gout, unspecified: Secondary | ICD-10-CM | POA: Diagnosis present

## 2022-09-09 DIAGNOSIS — Z5329 Procedure and treatment not carried out because of patient's decision for other reasons: Secondary | ICD-10-CM | POA: Diagnosis present

## 2022-09-09 DIAGNOSIS — I480 Paroxysmal atrial fibrillation: Secondary | ICD-10-CM | POA: Diagnosis present

## 2022-09-09 DIAGNOSIS — R079 Chest pain, unspecified: Secondary | ICD-10-CM

## 2022-09-09 HISTORY — DX: Unspecified atrial fibrillation: I48.91

## 2022-09-09 HISTORY — DX: Nausea with vomiting, unspecified: R11.2

## 2022-09-09 HISTORY — DX: Chronic atrial fibrillation, unspecified: I48.20

## 2022-09-09 LAB — IRON AND TIBC
Iron: 56 ug/dL (ref 28–170)
Saturation Ratios: 20 % (ref 10.4–31.8)
TIBC: 277 ug/dL (ref 250–450)
UIBC: 221 ug/dL

## 2022-09-09 LAB — CBC WITH DIFFERENTIAL/PLATELET
Abs Immature Granulocytes: 0.07 10*3/uL (ref 0.00–0.07)
Basophils Absolute: 0.1 10*3/uL (ref 0.0–0.1)
Basophils Relative: 1 %
Eosinophils Absolute: 0.2 10*3/uL (ref 0.0–0.5)
Eosinophils Relative: 2 %
HCT: 31.4 % — ABNORMAL LOW (ref 36.0–46.0)
Hemoglobin: 9.5 g/dL — ABNORMAL LOW (ref 12.0–15.0)
Immature Granulocytes: 1 %
Lymphocytes Relative: 23 %
Lymphs Abs: 2.6 10*3/uL (ref 0.7–4.0)
MCH: 29.1 pg (ref 26.0–34.0)
MCHC: 30.3 g/dL (ref 30.0–36.0)
MCV: 96.3 fL (ref 80.0–100.0)
Monocytes Absolute: 0.5 10*3/uL (ref 0.1–1.0)
Monocytes Relative: 5 %
Neutro Abs: 8 10*3/uL — ABNORMAL HIGH (ref 1.7–7.7)
Neutrophils Relative %: 68 %
Platelets: 417 10*3/uL — ABNORMAL HIGH (ref 150–400)
RBC: 3.26 MIL/uL — ABNORMAL LOW (ref 3.87–5.11)
RDW: 15 % (ref 11.5–15.5)
WBC: 11.5 10*3/uL — ABNORMAL HIGH (ref 4.0–10.5)
nRBC: 0 % (ref 0.0–0.2)

## 2022-09-09 LAB — COMPREHENSIVE METABOLIC PANEL
ALT: 7 U/L (ref 0–44)
AST: 13 U/L — ABNORMAL LOW (ref 15–41)
Albumin: 2.7 g/dL — ABNORMAL LOW (ref 3.5–5.0)
Alkaline Phosphatase: 83 U/L (ref 38–126)
Anion gap: 13 (ref 5–15)
BUN: 19 mg/dL (ref 6–20)
CO2: 26 mmol/L (ref 22–32)
Calcium: 8.5 mg/dL — ABNORMAL LOW (ref 8.9–10.3)
Chloride: 100 mmol/L (ref 98–111)
Creatinine, Ser: 4.57 mg/dL — ABNORMAL HIGH (ref 0.44–1.00)
GFR, Estimated: 11 mL/min — ABNORMAL LOW (ref >60)
Glucose, Bld: 91 mg/dL (ref 70–99)
Potassium: 2.3 mmol/L — CL (ref 3.5–5.1)
Sodium: 139 mmol/L (ref 135–145)
Total Bilirubin: 0.5 mg/dL (ref 0.3–1.2)
Total Protein: 6.9 g/dL (ref 6.5–8.1)

## 2022-09-09 LAB — TROPONIN I (HIGH SENSITIVITY)
Troponin I (High Sensitivity): 27 ng/L — ABNORMAL HIGH (ref <18)
Troponin I (High Sensitivity): 24 ng/L — ABNORMAL HIGH (ref <18)
Troponin I (High Sensitivity): 21 ng/L — ABNORMAL HIGH (ref <18)
Troponin I (High Sensitivity): 25 ng/L — ABNORMAL HIGH (ref <18)

## 2022-09-09 LAB — HEPATITIS B SURFACE ANTIGEN: Hepatitis B Surface Ag: NONREACTIVE

## 2022-09-09 LAB — TSH: TSH: 1.541 u[IU]/mL (ref 0.350–4.500)

## 2022-09-09 LAB — PHOSPHORUS: Phosphorus: 2.9 mg/dL (ref 2.5–4.6)

## 2022-09-09 LAB — GLUCOSE, CAPILLARY: Glucose-Capillary: 186 mg/dL — ABNORMAL HIGH (ref 70–99)

## 2022-09-09 LAB — FERRITIN: Ferritin: 688 ng/mL — ABNORMAL HIGH (ref 11–307)

## 2022-09-09 LAB — MAGNESIUM: Magnesium: 1.4 mg/dL — ABNORMAL LOW (ref 1.7–2.4)

## 2022-09-09 LAB — LIPASE, BLOOD: Lipase: 23 U/L (ref 11–51)

## 2022-09-09 MED ORDER — CHLORHEXIDINE GLUCONATE CLOTH 2 % EX PADS: 6.0000 | MEDICATED_PAD | Freq: Every day | CUTANEOUS | Status: DC

## 2022-09-09 MED ORDER — HYDRALAZINE HCL 20 MG/ML IJ SOLN: 10.0000 mg | Freq: Four times a day (QID) | INTRAMUSCULAR | Status: DC | PRN

## 2022-09-09 MED ORDER — INSULIN ASPART 100 UNIT/ML IJ SOLN
4.0000 [IU] | Freq: Three times a day (TID) | INTRAMUSCULAR | Status: DC
  Administered 2022-09-10 (×2): 4 [IU] via SUBCUTANEOUS

## 2022-09-09 MED ORDER — FERROUS SULFATE 325 (65 FE) MG PO TABS
325.0000 mg | ORAL_TABLET | Freq: Two times a day (BID) | ORAL | Status: DC
  Administered 2022-09-10: 325 mg via ORAL
  Filled 2022-09-09: qty 1

## 2022-09-09 MED ORDER — HYDROMORPHONE HCL 1 MG/ML IJ SOLN
0.5000 mg | Freq: Once | INTRAMUSCULAR | Status: AC
  Administered 2022-09-09: 0.5 mg via INTRAVENOUS
  Filled 2022-09-09: qty 1

## 2022-09-09 MED ORDER — MAGNESIUM SULFATE 2 GM/50ML IV SOLN
2.0000 g | Freq: Once | INTRAVENOUS | Status: AC
  Administered 2022-09-09: 2 g via INTRAVENOUS
  Filled 2022-09-09: qty 50

## 2022-09-09 MED ORDER — DARBEPOETIN ALFA 200 MCG/0.4ML IJ SOSY
200.0000 ug | PREFILLED_SYRINGE | INTRAMUSCULAR | Status: DC
  Administered 2022-09-10: 200 ug via SUBCUTANEOUS
  Filled 2022-09-09: qty 0.4

## 2022-09-09 MED ORDER — HYDROMORPHONE HCL 1 MG/ML IJ SOLN
0.5000 mg | INTRAMUSCULAR | Status: DC | PRN
  Administered 2022-09-09 – 2022-09-10 (×7): 0.5 mg via INTRAVENOUS
  Filled 2022-09-09 (×7): qty 1

## 2022-09-09 MED ORDER — LOPERAMIDE HCL 2 MG PO CAPS: 2.0000 mg | ORAL_CAPSULE | Freq: Four times a day (QID) | ORAL | Status: DC | PRN

## 2022-09-09 MED ORDER — POTASSIUM CHLORIDE 20 MEQ PO PACK
40.0000 meq | PACK | Freq: Once | ORAL | Status: DC
  Filled 2022-09-09: qty 2

## 2022-09-09 MED ORDER — PANTOPRAZOLE SODIUM 40 MG PO TBEC
40.0000 mg | DELAYED_RELEASE_TABLET | Freq: Every day | ORAL | Status: DC
  Administered 2022-09-09 – 2022-09-10 (×2): 40 mg via ORAL
  Filled 2022-09-09 (×2): qty 1

## 2022-09-09 MED ORDER — ADULT MULTIVITAMIN W/MINERALS CH
1.0000 | ORAL_TABLET | Freq: Every day | ORAL | Status: DC
  Administered 2022-09-09 – 2022-09-10 (×2): 1 via ORAL
  Filled 2022-09-09 (×2): qty 1

## 2022-09-09 MED ORDER — FENTANYL CITRATE PF 50 MCG/ML IJ SOSY: 25.0000 ug | PREFILLED_SYRINGE | Freq: Once | INTRAMUSCULAR | Status: DC

## 2022-09-09 MED ORDER — ONDANSETRON HCL 4 MG/2ML IJ SOLN
4.0000 mg | Freq: Four times a day (QID) | INTRAMUSCULAR | Status: DC | PRN
  Administered 2022-09-10 (×3): 4 mg via INTRAVENOUS
  Filled 2022-09-09 (×3): qty 2

## 2022-09-09 MED ORDER — NITROGLYCERIN 0.4 MG SL SUBL: 0.4000 mg | SUBLINGUAL_TABLET | SUBLINGUAL | Status: DC | PRN

## 2022-09-09 MED ORDER — POTASSIUM CHLORIDE 10 MEQ/100ML IV SOLN
10.0000 meq | INTRAVENOUS | Status: AC
  Administered 2022-09-09 (×3): 10 meq via INTRAVENOUS
  Filled 2022-09-09 (×3): qty 100

## 2022-09-09 MED ORDER — ONDANSETRON HCL 4 MG/2ML IJ SOLN: 4.0000 mg | Freq: Once | INTRAMUSCULAR | Status: DC

## 2022-09-09 MED ORDER — POTASSIUM CHLORIDE CRYS ER 20 MEQ PO TBCR: 40.0000 meq | EXTENDED_RELEASE_TABLET | Freq: Once | ORAL | Status: DC

## 2022-09-09 MED ORDER — APIXABAN 5 MG PO TABS
5.0000 mg | ORAL_TABLET | Freq: Two times a day (BID) | ORAL | Status: DC
  Administered 2022-09-09 – 2022-09-10 (×3): 5 mg via ORAL
  Filled 2022-09-09 (×3): qty 1

## 2022-09-09 MED ORDER — SODIUM CHLORIDE 0.9% FLUSH
3.0000 mL | Freq: Two times a day (BID) | INTRAVENOUS | Status: DC
  Administered 2022-09-09 – 2022-09-10 (×3): 3 mL via INTRAVENOUS

## 2022-09-09 MED ORDER — MIRTAZAPINE 15 MG PO TBDP
15.0000 mg | ORAL_TABLET | Freq: Every day | ORAL | Status: DC
  Administered 2022-09-09: 15 mg via ORAL
  Filled 2022-09-09 (×3): qty 1

## 2022-09-09 MED ORDER — METOPROLOL TARTRATE 5 MG/5ML IV SOLN
5.0000 mg | INTRAVENOUS | Status: DC | PRN
  Filled 2022-09-09: qty 5

## 2022-09-09 MED ORDER — FOLIC ACID 1 MG PO TABS
1.0000 mg | ORAL_TABLET | Freq: Every day | ORAL | Status: DC
  Administered 2022-09-09 – 2022-09-10 (×2): 1 mg via ORAL
  Filled 2022-09-09 (×2): qty 1

## 2022-09-09 MED ORDER — ONDANSETRON HCL 4 MG PO TABS: 4.0000 mg | ORAL_TABLET | Freq: Four times a day (QID) | ORAL | Status: DC | PRN

## 2022-09-09 MED ORDER — SODIUM CHLORIDE 0.9 % IV SOLN: 250.0000 mL | INTRAVENOUS | Status: DC | PRN

## 2022-09-09 MED ORDER — METHOCARBAMOL 1000 MG/10ML IJ SOLN: 500.0000 mg | Freq: Four times a day (QID) | INTRAVENOUS | Status: DC | PRN

## 2022-09-09 MED ORDER — HYDROMORPHONE HCL 1 MG/ML IJ SOLN
1.0000 mg | Freq: Once | INTRAMUSCULAR | Status: AC
  Administered 2022-09-09: 1 mg via INTRAVENOUS
  Filled 2022-09-09: qty 1

## 2022-09-09 MED ORDER — SODIUM CHLORIDE 0.9% FLUSH: 3.0000 mL | INTRAVENOUS | Status: DC | PRN

## 2022-09-09 MED ORDER — DOXERCALCIFEROL 4 MCG/2ML IV SOLN
6.0000 ug | INTRAVENOUS | Status: DC
  Filled 2022-09-09: qty 4

## 2022-09-09 MED ORDER — ATORVASTATIN CALCIUM 10 MG PO TABS
10.0000 mg | ORAL_TABLET | Freq: Every day | ORAL | Status: DC
  Administered 2022-09-09 – 2022-09-10 (×2): 10 mg via ORAL
  Filled 2022-09-09 (×2): qty 1

## 2022-09-09 MED ORDER — INSULIN DETEMIR 100 UNIT/ML ~~LOC~~ SOLN
15.0000 [IU] | Freq: Every day | SUBCUTANEOUS | Status: DC
  Administered 2022-09-09 – 2022-09-10 (×2): 15 [IU] via SUBCUTANEOUS
  Filled 2022-09-09 (×2): qty 0.15

## 2022-09-09 MED ORDER — INSULIN ASPART 100 UNIT/ML IJ SOLN
0.0000 [IU] | Freq: Three times a day (TID) | INTRAMUSCULAR | Status: DC
  Administered 2022-09-10: 1 [IU] via SUBCUTANEOUS
  Administered 2022-09-10: 2 [IU] via SUBCUTANEOUS

## 2022-09-09 MED ORDER — CARVEDILOL 12.5 MG PO TABS: 12.5000 mg | ORAL_TABLET | Freq: Two times a day (BID) | ORAL | Status: DC

## 2022-09-09 NOTE — ED Notes (Signed)
Pt complained about blood pressure cuff being too tight stated " take this damn cuff off of me"

## 2022-09-09 NOTE — ED Notes (Signed)
PT refused Fentanyl states it does not work for her, requests Dilaudid.Physician notified

## 2022-09-09 NOTE — ED Notes (Signed)
IV placed by Ems was coming out and needs to be removed.Phlebotomy was consulted for lab work and a iv consult was put in.

## 2022-09-09 NOTE — ED Notes (Signed)
ED TO INPATIENT HANDOFF REPORT  ED Nurse Name and Phone #: Rodney Booze [phone]  S Name/Age/Gender Deborah [name] 57 y.o. female Room/Bed: OTFC/OTF  Code Status   Code Status: Full Code  Home/SNF/Other Home Patient oriented to: self, place, time, and situation Is this baseline? Yes   Triage Complete: Triage complete  Chief Complaint Atrial fibrillation with RVR (HCC) [I48.91]  Triage Note PT BIB by [facility] from home with a c/o of chest pain and abdominal pain while sitting on her couch.PT missed her dialysis appt yesterday due to not feeling well.When EMS arrived, she was in uncontrolled AFIB 140-160, EMS administered 20mg  of Cardizem, HR now controlled at 90-100. No ASA was given due to a allergy and no nitroglycerin was given due to BP starting to drop.    Allergies Allergies  Allergen Reactions   Diazepam [name]   Gabapentin [name] and Swelling    Other reaction(s): Unknown   Iodinated Contrast Media Anaphylaxis    11/29/17 Cardiac arrest 1 min after IV contrast, possible allergy vs vasovagal episode Iopamidol  Anaphylaxis  High 11/28/2017  Patient had seizure like activity and then code post 100 cc of isovue 300     Isovue [Iopamidol] Anaphylaxis    11/28/17 Patient had seizure like activity and then 1 min code after 100 cc of isovue 300. Possible contrast allergy vs vasovagal episode   Lisinopril Anaphylaxis    Tongue and mouth swelling   Metoclopramide Other (See Comments)    Tardive dyskinesia  Also known as Reglan    Nsaids Anaphylaxis and Other (See Comments)    ULCER   Penicillins Palpitations    Has patient had a PCN reaction causing immediate rash, facial/tongue/throat swelling, SOB or lightheadedness with hypotension: Yes, heart races Has patient had a PCN reaction causing severe rash involving mucus membranes or skin necrosis: No Has patient had a PCN reaction that required hospitalization: Yes  Has patient had a PCN reaction  occurring within the last 10 years: No    Tolmetin Nausea Only and Other (See Comments)    ULCER   Dicyclomine Other (See Comments)    Chest pain   Acetaminophen Nausea Only and Other (See Comments)    Irritates stomach ulcer; Abdominal pain   Cyclobenzaprine Palpitations   Oxycodone Palpitations   Rifamycins Palpitations   Tramadol Nausea And Vomiting    Level of Care/Admitting Diagnosis ED Disposition     ED Disposition  Admit   Condition  --   Comment  Hospital Area: [facility] [[id number]]  Level of Care: Telemetry Medical [104]  May admit patient to [facility] or [facility] if equivalent level of care is available:: No  Covid Evaluation: Asymptomatic - no recent exposure (last 10 days) testing not required  Diagnosis: Atrial fibrillation with RVR [facility]) [[id number]]  Admitting Physician: Tereasa Coop [[id number]]  Attending Physician: Tereasa Coop [[id number]]  Certification:: I certify this patient will need inpatient services for at least 2 midnights  Estimated Length of Stay: 3          B Medical/Surgery History Past Medical History:  Diagnosis Date   Acute back pain with sciatica, left    Acute back pain with sciatica, right    AKI (acute kidney injury) (HCC)    Anemia, unspecified    Cancer (HCC)    Carcinoid tumor of duodenum    Chest pain with normal coronary angiography 2019   Chronic kidney disease, stage 3b (HCC)    Chronic  pain    Chronic systolic CHF (congestive heart failure) (HCC)    Diabetes mellitus    DKA (diabetic ketoacidosis) (HCC)    Drug-seeking behavior    21 hospitalizations and 14 CT a/p in 2 years for N/V and abdominal pain, demanding only IV dilaudid   Elevated troponin    chronic   Esophageal reflux    Fibromyalgia    Gastric ulcer    Gastroparesis    Gout    Hyperlipidemia    Hypertension    Hypokalemia    Hypomagnesemia    Lumbosacral stenosis    LVH (left ventricular hypertrophy)    Morbid obesity (HCC)     NICM (nonischemic cardiomyopathy) (HCC)    PAF (paroxysmal atrial fibrillation) (HCC)    Stroke (HCC) 02/2011   Thrombocytosis    Vitamin B12 deficiency anemia    Past Surgical History:  Procedure Laterality Date   AV FISTULA PLACEMENT Left 06/30/2022   Procedure: LEFT BRACHIOCEPHALIC ARTERIOVENOUS (AV) FISTULA CREATION;  Surgeon: Cephus Shelling, MD;  Location: [facility] OR;  Service: Vascular;  Laterality: Left;   BIOPSY  07/27/2019   Procedure: BIOPSY;  Surgeon: Vida Rigger, MD;  Location: [facility] ENDOSCOPY;  Service: Endoscopy;;   BIOPSY  07/30/2019   Procedure: BIOPSY;  Surgeon: Kathi Der, MD;  Location: [facility];  Service: Gastroenterology;;   CATARACT EXTRACTION  01/2014   CHOLECYSTECTOMY     COLONOSCOPY WITH PROPOFOL N/A 07/30/2019   Procedure: COLONOSCOPY WITH PROPOFOL;  Surgeon: Kathi Der, MD;  Location: [facility];  Service: Gastroenterology;  Laterality: N/A;   ESOPHAGOGASTRODUODENOSCOPY N/A 07/27/2019   Procedure: ESOPHAGOGASTRODUODENOSCOPY (EGD);  Surgeon: Vida Rigger, MD;  Location: [facility];  Service: Endoscopy;  Laterality: N/A;   ESOPHAGOGASTRODUODENOSCOPY N/A 07/26/2020   Procedure: ESOPHAGOGASTRODUODENOSCOPY (EGD);  Surgeon: Willis Modena, MD;  Location: [facility];  Service: Endoscopy;  Laterality: N/A;   ESOPHAGOGASTRODUODENOSCOPY (EGD) WITH PROPOFOL N/A 08/02/2019   Procedure: ESOPHAGOGASTRODUODENOSCOPY (EGD) WITH PROPOFOL;  Surgeon: Kathi Der, MD;  Location: [facility];  Service: Gastroenterology;  Laterality: N/A;   HEMOSTASIS CLIP PLACEMENT  08/02/2019   Procedure: HEMOSTASIS CLIP PLACEMENT;  Surgeon: Kathi Der, MD;  Location: [facility];  Service: Gastroenterology;;   IR FLUORO GUIDE CV LINE RIGHT  06/24/2022   IR US GUIDE VASC ACCESS RIGHT  06/24/2022   POLYPECTOMY  07/30/2019   Procedure: POLYPECTOMY;  Surgeon: Kathi Der, MD;  Location: [facility];  Service: Gastroenterology;;   POLYPECTOMY  08/02/2019    Procedure: POLYPECTOMY;  Surgeon: Kathi Der, MD;  Location: [facility];  Service: Gastroenterology;;     A IV Location/Drains/Wounds Patient Lines/Drains/Airways Status     Active Line/Drains/Airways     Name Placement date Placement time Site Days   Peripheral IV 09/09/22 22 G 1.75" Anterior;Right;Lateral;Distal Forearm 09/09/22  1751  Forearm  less than 1   Fistula / Graft Left Upper arm Arteriovenous fistula 06/30/22  1200  Upper arm  71   Hemodialysis Catheter Right Subclavian Double lumen Permanent (Tunneled) 06/24/22  1644  Subclavian  77   Wound / Incision (Open or Dehisced) 06/19/22 Skin tear Sacrum Mid stage 2 small abraison present on admission 06/19/22  1840  Sacrum  82   Wound / Incision (Open or Dehisced) 06/19/22 Heel Left 06/19/22  2100  Heel  82            Intake/Output Last 24 hours  Intake/Output Summary (Last 24 hours) at 09/09/2022 2000 Last data filed at 09/09/2022 2000 Gross per 24 hour  Intake 100 ml  Output --  Net 100 ml    Labs/Imaging Results for orders placed or performed during the hospital encounter of 09/09/22 (from the past 48 hour(s))  Lipase, blood     Status: None   Collection Time: 09/09/22  4:24 PM  Result Value Ref Range   Lipase 23 11 - 51 U/L    Comment: Performed at [facility], [address], [place], [place] [address]  CBC with Differential     Status: Abnormal   Collection Time: 09/09/22  4:46 PM  Result Value Ref Range   WBC 11.5 (H) 4.0 - 10.5 K/uL   RBC 3.26 (L) 3.87 - 5.11 MIL/uL   Hemoglobin 9.5 (L) 12.0 - 15.0 g/dL   HCT 19.1 (L) 47.8 - 29.5 %   MCV 96.3 80.0 - 100.0 fL   MCH 29.1 26.0 - 34.0 pg   MCHC 30.3 30.0 - 36.0 g/dL   RDW 62.1 30.8 - 65.7 %   Platelets 417 (H) 150 - 400 K/uL   nRBC 0.0 0.0 - 0.2 %   Neutrophils Relative % 68 %   Neutro Abs 8.0 (H) 1.7 - 7.7 K/uL   Lymphocytes Relative 23 %   Lymphs Abs 2.6 0.7 - 4.0 K/uL   Monocytes Relative 5 %   Monocytes Absolute 0.5 0.1 - 1.0 K/uL    Eosinophils Relative 2 %   Eosinophils Absolute 0.2 0.0 - 0.5 K/uL   Basophils Relative 1 %   Basophils Absolute 0.1 0.0 - 0.1 K/uL   Immature Granulocytes 1 %   Abs Immature Granulocytes 0.07 0.00 - 0.07 K/uL    Comment: Performed at [facility], [address] [place], [place] [address]  Comprehensive metabolic panel     Status: Abnormal   Collection Time: 09/09/22  4:46 PM  Result Value Ref Range   Sodium 139 135 - 145 mmol/L   Potassium 2.3 (LL) 3.5 - 5.1 mmol/L    Comment: CRITICAL RESULT CALLED TO, READ BACK BY AND VERIFIED WITH T,MORRISON RN @1801  09/09/22 E,[place]   Chloride 100 98 - 111 mmol/L   CO2 26 22 - 32 mmol/L   Glucose, Bld 91 70 - 99 mg/dL    Comment: Glucose reference range applies only to samples taken after fasting for at least 8 hours.   BUN 19 6 - 20 mg/dL   Creatinine, Ser 2.95 (H) 0.44 - 1.00 mg/dL   Calcium 8.5 (L) 8.9 - 10.3 mg/dL   Total Protein 6.9 6.5 - 8.1 g/dL   Albumin 2.7 (L) 3.5 - 5.0 g/dL   AST 13 (L) 15 - 41 U/L   ALT 7 0 - 44 U/L   Alkaline Phosphatase 83 38 - 126 U/L   Total Bilirubin 0.5 0.3 - 1.2 mg/dL   GFR, Estimated 11 (L) >60 mL/min    Comment: (NOTE) Calculated using the CKD-EPI Creatinine Equation (2021)    Anion gap 13 5 - 15    Comment: Performed at [facility], [address] [place], [place] [address]  Phosphorus     Status: None   Collection Time: 09/09/22  4:46 PM  Result Value Ref Range   Phosphorus 2.9 2.5 - 4.6 mg/dL    Comment: Performed at [facility], [address] [place], [place] [address]  Magnesium     Status: Abnormal   Collection Time: 09/09/22  4:46 PM  Result Value Ref Range   Magnesium 1.4 (L) 1.7 - 2.4 mg/dL  Comment: Performed at [facility], [address] [place], [place] [address]  Troponin I (High Sensitivity)     Status: Abnormal   Collection Time: 09/09/22  4:46 PM  Result Value Ref Range   Troponin I (High Sensitivity) 27 (H) <18 ng/L    Comment: (NOTE) Elevated  high sensitivity troponin I (hsTnI) values and significant  changes across serial measurements may suggest ACS but many other  chronic and acute conditions are known to elevate hsTnI results.  Refer to the "Links" section for chest pain algorithms and additional  guidance. Performed at [facility], [address] [place], [place] [address]   Troponin I (High Sensitivity)     Status: Abnormal   Collection Time: 09/09/22  6:21 PM  Result Value Ref Range   Troponin I (High Sensitivity) 24 (H) <18 ng/L    Comment: (NOTE) Elevated high sensitivity troponin I (hsTnI) values and significant  changes across serial measurements may suggest ACS but many other  chronic and acute conditions are known to elevate hsTnI results.  Refer to the "Links" section for chest pain algorithms and additional  guidance. Performed at [facility], [address] [place], [place] [address]    DG Chest Portable 1 View  Result Date: 09/09/2022 CLINICAL DATA:  Chest pain EXAM: PORTABLE CHEST 1 VIEW COMPARISON:  07/13/2022 FINDINGS: Dual lumen right-sided central venous catheter in satisfactory position. No focal consolidation. No pleural effusion or pneumothorax. Heart and mediastinal contours are unremarkable. No acute osseous abnormality. IMPRESSION: 1. No acute cardiopulmonary disease. Electronically Signed   By: Elige Ko M.D.   On: 09/09/2022 17:17    Pending Labs [employer] (From admission, onward)     Start     Ordered   Signed and Held  TSH  Add-on,   R        Signed and Held   Signed and Held  Comprehensive metabolic panel  Tomorrow morning,   R        Signed and Held   Signed and Held  CBC  Tomorrow morning,   R        Signed and Held            Vitals/Pain Today's Vitals   09/09/22 1611 09/09/22 1657 09/09/22 1805 09/09/22 1924  Weight: 133.8 kg     Height: 5\' 6"  (1.676 m)     PainSc:  10-Worst pain ever 10-Worst pain ever 8     Isolation Precautions No active  isolations  Medications Medications  potassium chloride 10 mEq in 100 mL IVPB (0 mEq Intravenous Stopped 09/09/22 2000)  potassium chloride SA (KLOR-CON M) CR tablet 40 mEq (40 mEq Oral Not Given 09/09/22 1907)  magnesium sulfate IVPB 2 g 50 mL (2 g Intravenous New Bag/Given 09/09/22 2000)  HYDROmorphone (DILAUDID) injection 0.5 mg (0.5 mg Intravenous Given 09/09/22 1722)  HYDROmorphone (DILAUDID) injection 1 mg (1 mg Intravenous Given 09/09/22 1837)    Mobility walks with person assist     Focused Assessments Cardiac Assessment Handoff:  Cardiac Rhythm: Sinus tachycardia Lab Results  Component Value Date   CKTOTAL 140 05/11/2022   CKMB 3.0 03/24/2010   TROPONINI <0.03 07/17/2018   Lab Results  Component Value Date   DDIMER 0.47 08/03/2021   Does the Patient currently have chest pain? No   , Renal Assessment Handoff:  Hemodialysis Schedule: Hemodialysis Schedule: Monday/Wednesday/Friday Last Hemodialysis date and time: 09/09/2022    Restricted appendage: left arm  R Recommendations: See Admitting Provider Note  Report given to:   Additional Notes:

## 2022-09-09 NOTE — ED Notes (Addendum)
IV was coming out

## 2022-09-09 NOTE — H&P (Addendum)
History and Physical    Deborah [name] ZOX:[id number] DOB: 1965/11/22 DOA: 09/09/2022  DOS: the patient was seen and examined on 09/09/2022  PCP: Pcp, No   Patient coming from: Home   Chief Complaint:  Chief Complaint  Patient presents with   Chest Pain   Abdominal Pain    HPI:  This is a 57 year old female medical history of CKD stage IV on hemodialysis MWF schedule, insulin-dependent diabetes mellitus type 2, diabetic neuropathy, diabetic gastroparesis, hypertension, atrial fibrillation on Eliquis, chronic HFpEF, chronic hypokalemia, chronic back pain secondary to spinal stenosis, duodenal carcinoma s/p resection 2021, chronic narcotic dependence, morbid obesity and chronic ambulatory dysfunction wheelchair-bound present to emergency department today with complaining of chest pain, abdominal pain or shortness of breath as she missed her dialysis session yesterday 09/08/2022.  Patient reported that she was feeling nauseated and vomited twice yesterday and not feeling well to go for dialysis.   During my evaluation patient is stating me that she takes Dilaudid 4 mg at home and she is requesting for Dilaudid.  She said over here in the ED they gave her 0.5 mg of Dilaudid which is not helping her with the chest pain, abdominal pain and back pain.   Patient reported that she has been recently started on dialysis 1 month ago.  She tried to be compliant with outpatient dialysis.    Patient denies any nausea, vomiting, diarrhea, fever, chills, lower extremity swelling, headache, weight and appetite change. When EMS arrived, patient was found to have A-fib RVR heart rate 140-160, EMS administered 20 mg of Cardizem without heart well-controlled to 90 to 100.  ED Course:  At ED initial presentation blood pressure 90/55 and heart rate 135.  Respirate 18. EKG obtained showed rate controlled heart rate 104. Initial troponin 27 however EKG ruled out ACS.  Continue to trend troponin. CMP showed  potassium 2.3, low albumin 2.7 otherwise evidence of ESRD.  Phos 2.9 within normal range. Mag 1.4 slightly low. CBC unremarkable except evidence of chronic anemia hemoglobin 9.5, slightly elevated WBC count 11.5. Lipase within normal range 23.  In the emergency department potassium has been repleted KCl IV 10 x 2 and oral KCl 40 once.  Emergency medicine physician also reached out to nephrology as well.  Review of Systems:  Review of Systems  Constitutional:  Negative for chills, fever, malaise/fatigue and weight loss.  Cardiovascular:  Negative for chest pain, palpitations, orthopnea and leg swelling.  Gastrointestinal:  Positive for abdominal pain. Negative for heartburn, nausea and vomiting.  Genitourinary:  Negative for dysuria, frequency and urgency.  Musculoskeletal:  Positive for back pain. Negative for myalgias.  Neurological:  Negative for dizziness, weakness and headaches.  Psychiatric/Behavioral:  Negative for depression. The patient is nervous/anxious.        Patient seems very anxious and requesting for Dilaudid multiple times.    Past Medical History:  Diagnosis Date   Acute back pain with sciatica, left    Acute back pain with sciatica, right    AKI (acute kidney injury) (HCC)    Anemia, unspecified    Cancer (HCC)    Carcinoid tumor of duodenum    Chest pain with normal coronary angiography 2019   Chronic kidney disease, stage 3b (HCC)    Chronic pain    Chronic systolic CHF (congestive heart failure) (HCC)    Diabetes mellitus    DKA (diabetic ketoacidosis) (HCC)    Drug-seeking behavior    21 hospitalizations and 14 CT a/p in 2 years  for N/V and abdominal pain, demanding only IV dilaudid   Elevated troponin    chronic   Esophageal reflux    Fibromyalgia    Gastric ulcer    Gastroparesis    Gout    Hyperlipidemia    Hypertension    Hypokalemia    Hypomagnesemia    Lumbosacral stenosis    LVH (left ventricular hypertrophy)    Morbid obesity (HCC)     NICM (nonischemic cardiomyopathy) (HCC)    PAF (paroxysmal atrial fibrillation) (HCC)    Stroke (HCC) 02/2011   Thrombocytosis    Vitamin B12 deficiency anemia     Past Surgical History:  Procedure Laterality Date   AV FISTULA PLACEMENT Left 06/30/2022   Procedure: LEFT BRACHIOCEPHALIC ARTERIOVENOUS (AV) FISTULA CREATION;  Surgeon: Cephus Shelling, MD;  Location: [facility] OR;  Service: Vascular;  Laterality: Left;   BIOPSY  07/27/2019   Procedure: BIOPSY;  Surgeon: Vida Rigger, MD;  Location: [facility]COPY;  Service: Endoscopy;;   BIOPSY  07/30/2019   Procedure: BIOPSY;  Surgeon: Kathi Der, MD;  Location: [facility];  Service: Gastroenterology;;   CATARACT EXTRACTION  01/2014   CHOLECYSTECTOMY     COLONOSCOPY WITH PROPOFOL N/A 07/30/2019   Procedure: COLONOSCOPY WITH PROPOFOL;  Surgeon: Kathi Der, MD;  Location: [facility];  Service: Gastroenterology;  Laterality: N/A;   ESOPHAGOGASTRODUODENOSCOPY N/A 07/27/2019   Procedure: ESOPHAGOGASTRODUODENOSCOPY (EGD);  Surgeon: Vida Rigger, MD;  Location: [facility];  Service: Endoscopy;  Laterality: N/A;   ESOPHAGOGASTRODUODENOSCOPY N/A 07/26/2020   Procedure: ESOPHAGOGASTRODUODENOSCOPY (EGD);  Surgeon: Willis Modena, MD;  Location: [facility];  Service: Endoscopy;  Laterality: N/A;   ESOPHAGOGASTRODUODENOSCOPY (EGD) WITH PROPOFOL N/A 08/02/2019   Procedure: ESOPHAGOGASTRODUODENOSCOPY (EGD) WITH PROPOFOL;  Surgeon: Kathi Der, MD;  Location: [facility];  Service: Gastroenterology;  Laterality: N/A;   HEMOSTASIS CLIP PLACEMENT  08/02/2019   Procedure: HEMOSTASIS CLIP PLACEMENT;  Surgeon: Kathi Der, MD;  Location: [facility];  Service: Gastroenterology;;   IR FLUORO GUIDE CV LINE RIGHT  06/24/2022   IR US GUIDE VASC ACCESS RIGHT  06/24/2022   POLYPECTOMY  07/30/2019   Procedure: POLYPECTOMY;  Surgeon: Kathi Der, MD;  Location: [facility];  Service: Gastroenterology;;   POLYPECTOMY  08/02/2019   Procedure:  POLYPECTOMY;  Surgeon: Kathi Der, MD;  Location: [facility];  Service: Gastroenterology;;     reports that she has never smoked. She has never used smokeless tobacco. She reports that she does not drink alcohol and does not use drugs.  Allergies  Allergen Reactions   Diazepam [name]   Gabapentin [name] and Swelling    Other reaction(s): Unknown   Iodinated Contrast Media Anaphylaxis    11/29/17 Cardiac arrest 1 min after IV contrast, possible allergy vs vasovagal episode Iopamidol  Anaphylaxis  High 11/28/2017  Patient had seizure like activity and then code post 100 cc of isovue 300     Isovue [Iopamidol] Anaphylaxis    11/28/17 Patient had seizure like activity and then 1 min code after 100 cc of isovue 300. Possible contrast allergy vs vasovagal episode   Lisinopril Anaphylaxis    Tongue and mouth swelling   Metoclopramide Other (See Comments)    Tardive dyskinesia  Also known as Reglan    Nsaids Anaphylaxis and Other (See Comments)    ULCER   Penicillins Palpitations    Has patient had a PCN reaction causing immediate rash, facial/tongue/throat swelling, SOB or lightheadedness with hypotension: Yes, heart races Has patient had a PCN reaction causing severe  rash involving mucus membranes or skin necrosis: No Has patient had a PCN reaction that required hospitalization: Yes  Has patient had a PCN reaction occurring within the last 10 years: No    Tolmetin Nausea Only and Other (See Comments)    ULCER   Dicyclomine Other (See Comments)    Chest pain   Acetaminophen Nausea Only and Other (See Comments)    Irritates stomach ulcer; Abdominal pain   Cyclobenzaprine Palpitations   Oxycodone Palpitations   Rifamycins Palpitations   Tramadol Nausea And Vomiting    Family History  Problem Relation Age of Onset   Diabetes Mother    Diabetes Father    Heart disease Father    Diabetes Sister    Congestive Heart Failure Sister 60   Diabetes  Brother     Prior to Admission medications   Medication Sig Start Date End Date Taking? Authorizing Provider  albuterol (PROVENTIL) (2.5 MG/3ML) 0.083% nebulizer solution Take 3 mLs (2.5 mg total) by nebulization every 6 (six) hours as needed for wheezing or shortness of breath. 04/06/19   Bing Neighbors, NP  apixaban (ELIQUIS) 5 MG TABS tablet Take 5 mg by mouth 2 (two) times daily. 12/03/21   [provider]  atorvastatin (LIPITOR) 10 MG tablet Take 10 mg by mouth at bedtime. 12/03/21   [provider]  carvedilol (COREG) 12.5 MG tablet Take 1 tablet (12.5 mg total) by mouth 2 (two) times daily with a meal. 06/04/22   Leroy Sea, MD  colchicine 0.6 MG tablet Take 0.5 tablets (0.3 mg total) by mouth 2 (two) times a week. 07/15/22   Zannie Cove, MD  EASY COMFORT PEN NEEDLES 31G X 5 MM MISC USE 3 TIMES A DAY FOR INSULIN ADMINISTRATION 11/14/19   Meccariello, Solmon Ice, MD  famotidine (PEPCID) 20 MG tablet Take 1 tablet (20 mg total) by mouth daily. 03/22/22 07/20/22  Cora Collum, DO  ferrous sulfate 325 (65 FE) MG tablet Take 1 tablet (325 mg total) by mouth 2 (two) times daily with a meal. 05/27/22   Kathlen Mody, MD  fluticasone (FLONASE) 50 MCG/ACT nasal spray Place 2 sprays into both nostrils daily as needed for allergies or rhinitis. Patient taking differently: Place 1 spray into both nostrils daily as needed for allergies. 12/19/18   Rai, Delene Ruffini, MD  folic acid (FOLVITE) 1 MG tablet Take 1 mg by mouth daily. 04/07/21   [provider]  furosemide (LASIX) 80 MG tablet Take 1 tablet (80 mg total) by mouth daily. 07/06/22   Rhetta Mura, MD  guaiFENesin (MUCINEX) 600 MG 12 hr tablet Take 1 tablet (600 mg total) by mouth 2 (two) times daily. 07/05/22   Rhetta Mura, MD  HYDROmorphone (DILAUDID) 4 MG tablet Take 1 tablet (4 mg total) by mouth 3 (three) times daily as needed for moderate pain. Three times a day as needed 20 days out of the month.  08/23/22   Jones Bales, NP  insulin aspart (NOVOLOG) 100 UNIT/ML injection Inject 5 Units into the skin 3 (three) times daily with meals. 06/04/22   Leroy Sea, MD  insulin glargine-yfgn (SEMGLEE) 100 UNIT/ML injection Inject 0.15 mLs (15 Units total) into the skin 2 (two) times daily. 07/05/22   Rhetta Mura, MD  insulin lispro (HUMALOG) 100 UNIT/ML KwikPen Before each meal 3 times a day, 140-199 - 2 units, 200-250 - 6 units, 251-299 - 8 units,  300-349 - 12 units,  350 or above 14 units. Patient taking  differently: Inject 2-14 Units into the skin See admin instructions. Before each meal and at bedtime. 140-199 - 2 units, 200-250 - 6 units, 251-299 - 8 units,  300-349 - 12 units,  350 or above 14 units. 06/04/22   Leroy Sea, MD  loperamide (IMODIUM A-D) 2 MG tablet Take 2 mg by mouth every 6 (six) hours as needed for diarrhea or loose stools.    [provider]  mirtazapine (REMERON SOL-TAB) 15 MG disintegrating tablet Take 1 tablet (15 mg total) by mouth at bedtime. 07/05/22   Rhetta Mura, MD  Multiple Vitamin (MULTIVITAMIN WITH MINERALS) TABS tablet Take 1 tablet by mouth daily. 05/28/22   Kathlen Mody, MD  ondansetron (ZOFRAN ODT) 4 MG disintegrating tablet Take 1 tablet (4 mg total) by mouth every 8 (eight) hours as needed for nausea or vomiting. 05/30/21   [name], MD  pantoprazole (PROTONIX) 40 MG tablet TAKE 1 TABLET BY MOUTH 2 (TWO) TIMES DAILY. (AM+BEDTIME) 08/30/22   Raulkar, Drema Pry, MD  Vitamin D, Ergocalciferol, (DRISDOL) 1.25 MG (50000 UNIT) CAPS capsule Take 50,000 Units by mouth once a week. 04/07/21   [provider]     Physical Exam: Vitals:   09/09/22 1611 09/09/22 2002 09/09/22 2015  BP:  (!) 90/55 102/61  Pulse:  (!) 135 91  Resp:  18 11  Temp:  98.2 F (36.8 C)   TempSrc:  Oral   SpO2:   100%  Weight: 133.8 kg    Height: 5\' 6"  (1.676 m)      Physical Exam Constitutional:      Appearance: She is obese.   HENT:     Head: Normocephalic and atraumatic.  Cardiovascular:     Rate and Rhythm: Tachycardia present. Rhythm irregular.     Pulses:          Carotid pulses are 2+ on the right side and 2+ on the left side.      Radial pulses are 2+ on the right side and 2+ on the left side.       Dorsalis pedis pulses are 2+ on the right side and 2+ on the left side.       Posterior tibial pulses are 2+ on the right side and 2+ on the left side.     Heart sounds: Normal heart sounds.     No systolic murmur is present.     No diastolic murmur is present.  Pulmonary:     Effort: Pulmonary effort is normal.     Breath sounds: Normal breath sounds.  Chest:     Chest wall: No tenderness.  Abdominal:     General: Bowel sounds are normal.     Palpations: Abdomen is soft. There is no hepatomegaly or mass.     Tenderness: There is no abdominal tenderness. There is no guarding or rebound.  Musculoskeletal:     Cervical back: Normal range of motion.     Right lower leg: No edema.     Left lower leg: No edema.  Skin:    General: Skin is warm.     Capillary Refill: Capillary refill takes less than 2 seconds.  Neurological:     Mental Status: She is alert and oriented to person, place, and time.  Psychiatric:        Mood and Affect: Mood is anxious.        Behavior: Behavior is not agitated.      Labs on Admission: I have personally reviewed following labs  and imaging studies  CBC: Recent Labs  Lab 09/09/22 1646  WBC 11.5*  NEUTROABS 8.0*  HGB 9.5*  HCT 31.4*  MCV 96.3  PLT 417*   Basic Metabolic Panel: Recent Labs  Lab 09/09/22 1646  NA 139  K 2.3*  CL 100  CO2 26  GLUCOSE 91  BUN 19  CREATININE 4.57*  CALCIUM 8.5*  MG 1.4*  PHOS 2.9   GFR: Estimated Creatinine Clearance: 18.9 mL/min (A) (by C-G formula based on SCr of 4.57 mg/dL (H)). Liver Function Tests: Recent Labs  Lab 09/09/22 1646  AST 13*  ALT 7  ALKPHOS 83  BILITOT 0.5  PROT 6.9  ALBUMIN 2.7*   Recent Labs   Lab 09/09/22 1624  LIPASE 23   No results for input(s): "AMMONIA" in the last 168 hours. Coagulation Profile: No results for input(s): "INR", "PROTIME" in the last 168 hours. Cardiac Enzymes: Recent Labs  Lab 09/09/22 1646 09/09/22 1821  TROPONINIHS 27* 24*   BNP (last 3 results) Recent Labs    06/02/22 0633 06/03/22 0432 06/18/22 1817  BNP 628.4* 384.8* 267.3*   HbA1C: No results for input(s): "HGBA1C" in the last 72 hours. CBG: No results for input(s): "GLUCAP" in the last 168 hours. Lipid Profile: No results for input(s): "CHOL", "HDL", "LDLCALC", "TRIG", "CHOLHDL", "LDLDIRECT" in the last 72 hours. Thyroid Function Tests: No results for input(s): "TSH", "T4TOTAL", "FREET4", "T3FREE", "THYROIDAB" in the last 72 hours. Anemia Panel: No results for input(s): "VITAMINB12", "FOLATE", "FERRITIN", "TIBC", "IRON", "RETICCTPCT" in the last 72 hours. Urine analysis:    Component Value Date/Time   COLORURINE STRAW (A) 06/19/2022 0638   APPEARANCEUR HAZY (A) 06/19/2022 0638   LABSPEC 1.010 06/19/2022 0638   PHURINE 5.0 06/19/2022 0638   GLUCOSEU NEGATIVE 06/19/2022 0638   HGBUR SMALL (A) 06/19/2022 0638   BILIRUBINUR NEGATIVE 06/19/2022 0638   KETONESUR NEGATIVE 06/19/2022 0638   PROTEINUR 30 (A) 06/19/2022 0638   UROBILINOGEN 0.2 10/02/2013 2108   NITRITE NEGATIVE 06/19/2022 0638   LEUKOCYTESUR LARGE (A) 06/19/2022 1610    Radiological Exams on Admission: I have personally reviewed images DG Chest Portable 1 View  Result Date: 09/09/2022 CLINICAL DATA:  Chest pain EXAM: PORTABLE CHEST 1 VIEW COMPARISON:  07/13/2022 FINDINGS: Dual lumen right-sided central venous catheter in satisfactory position. No focal consolidation. No pleural effusion or pneumothorax. Heart and mediastinal contours are unremarkable. No acute osseous abnormality. IMPRESSION: 1. No acute cardiopulmonary disease. Electronically Signed   By: Elige Ko M.D.   On: 09/09/2022 17:17    EKG: My personal  interpretation of EKG shows:  Atrial fibrillation rate controlled.  No evidence of ST and T wave abnormality.    Assessment/Plan: Principal Problem:   Atrial fibrillation with RVR (HCC)-resolved Active Problems:   Chronic hypokalemia   Elevated troponin   Chronic a-fib (HCC)   Essential hypertension   Insulin dependent type 2 diabetes mellitus (HCC)   Nausea & vomiting   End stage renal disease on dialysis (HCC)   Peripheral neuropathy   (HFpEF) heart failure with preserved ejection fraction (HCC)   Chronic anemia   Hypotension    Assessment and Plan: Atrial fibrillation with RVR (RVR has been resolved) Chronic atrial fibrillation-on Eliquis -Patient reported not feeling very well, nausea, abdominal pain and an 2 episode vomitings yesterday for that reason she missed her dialysis on 09/08/2022. - She called EMS today on initial presentation they found A-fib RVR 140-160.  Heart rate improved with Cardizem 20 mg.  Due to aspirin allergy  they were not able to give aspirin initially. - Upon presentation to ED EKG showed RVR has been resolved.  Evidence of chronic atrial fibrillation without any ST and T wave abnormality. - Slight elevated troponin 27.  EKG unremarkable.  ACS has been ruled out. -To continue Eliquis 5 mg twice daily. - Resumed home Coreg 12.5 mg twice daily. - Continue telemetry monitor overnight.   Elevated troponin Atypical chest pain-resolved -Slight elevated troponin 27.  EKG unremarkable.  ACS has been ruled out. -Elevated troponin secondary from in the setting of A-fib RVR and demand ischemia. -Chest x-ray unremarkable - Continue to trend troponin for next 2 labs. - If patient develops chest pain we will obtain another EKG. - Continue telemonitoring - Obtaining an echocardiogram -Patient is allergic to aspirin however patient is on Eliquis which I am continuing. -Continue telemonitoring overnight   History of hypertension Diastolic heart failure with  preserved EF 50% to 55% Currently hypotensive -Patient's home blood pressure regimen includes Coreg 12.5 mg twice daily, Lasix 80 mg daily. -Per chart review when patient was given Cardizem 20 mg by EMS her blood pressure has been dropped however unable to found any records on the chart. - Per chart review previous echocardiogram from 09/11/2021 showed preserved EF 50 to 55%, moderate left ventricular hypertrophy, left ventricular diastolic parameter intermittent. -Most recent blood pressure 90/55.  Currently hypotensive due to A-fib. -Decreasing dose of Coreg 12.5 mg to 6.25 mg twice daily starting from tomorrow.  Holding Lasix tonight. -Continue telemonitoring for any development of arrhythmia with RVR. - Continue as needed Lopressor 5 mg every 5-minute as needed for heart rate more than 140. -Continue nitroglycerin 0.4 milligram every 5 needed as needed for chest pain  and dilaudid 0.5 to 1 mg every 4 hours as needed for severe pain.  Hypokalemia Hypomagnesemia - Patient has history of chronic hypokalemia.  On initial presentation potassium 2.3.  She received total 70 meq of KCL in the ED. -I am ordering another of KCL. -Low magnesium 1.5 on presentation. - Repleting with 2 g of mag sulfate. -Checking BMP and magnesium level in the morning and replete as needed.  CKD stage IV now on dialysis MWF - Patient reported recently had close been started on dialysis for last 1 month. - Dialysis unit [facility]- [facility] [phone] (Work) [address] [place], [place] [address]  -Patient missed her diet C-section on 09/08/2022 -Patient vitals are stable and she is not volume overloaded.  O2 sat 100% on room air.  No urgent need for dialysis tonight. -I have reached out to nephrology just Dr.Peeples, plan for dialysis tomorrow. -Avoid nephrotoxic agents as possible. - Patient is still does make urine.  Continuing Lasix 80 mg daily.  Insulin-dependent  diabetes mellitus type 2 Most recent A1c 8.12 months ago. -Patient's home regimen include long-acting insulin 15 unit daily, NovoLog 5 units 3 times daily with meals and low sliding scale SSI as needed. -Resumed same dose adjusted with home regimen. -Continue consistent diet  Nausea-vomiting-resolved Diabetic gastroparesis -Patient has history of diabetic gastroparesis.  She presented with complaining of nausea and 2 beats of vomiting yesterday.  Denies any fever, chills, eating outside food and concern for food poisoning no one in the family has same complaint.  She has history of chronic nausea and occasional vomiting. -Lipase within normal range.  On physical exam patient does not have any abdominal tenderness, no guarding and normal bowel sounds. - Continue to monitor as of now  for developing any episodes of severe vomiting. -Continue Zofran as needed.   Chronic pain syndrome -Per chart review at home patient takes Dilaudid 4 mg 3 times daily as needed for pain.  Review [place] PMD which showed patient is obtaining pain medication from a different source. During my interview at the bedside patient is requesting to start the Dilaudid 4 mg and she is very upset that no one is giving her Dilaudid yet. I told the patient that I will hold Dilaudid for now unless there is an indication to start that.  Patient is not very happy with my decision. -Currently I have ordered Dilaudid 0.5 to 1 mg IV every 4 hours as needed for severe chest and abdominal pain.   History of insomnia - Resumed home Remeron 15 mg at bedtime as needed.  Anemia of chronic disease from ESRD - Stable H&H 9.5 and 31.4. - Continue home ferrous sulfate 325 mg daily.  DVT prophylaxis: Eliquis Code Status: Full Code Diet: Carb consistent diet Family Communication:  none Disposition Plan: Plan to discharge to home within 1 to 2 days. Consults: Nephrology Admission status: Inpatient, Telemetry bed  Severity of  Illness: The appropriate patient status for this patient is INPATIENT. Inpatient status is judged to be reasonable and necessary in order to provide the required intensity of service to ensure the patient's safety. The patient's presenting symptoms, physical exam findings, and initial radiographic and laboratory data in the context of their chronic comorbidities is felt to place them at high risk for further clinical deterioration. Furthermore, it is not anticipated that the patient will be medically stable for discharge from the hospital within 2 midnights of admission.   * I certify that at the point of admission it is my clinical judgment that the patient will require inpatient hospital care spanning beyond 2 midnights from the point of admission due to high intensity of service, high risk for further deterioration and high frequency of surveillance required.[name]    Tereasa Coop MD [facility]  How to contact the [facility] Attending or Consulting provider 7A - 7P or covering provider during after hours 7P -7A, for this patient?   Check the care team in [facility] and look for a) attending/consulting [facility] provider listed and b) the [facility] team listed Log into [email or web address] and use [place]'s universal password to access. If you do not have the password, please contact the hospital operator. Locate the [facility] provider you are looking for under [facility] and page to a number that you can be directly reached. If you still have difficulty reaching the provider, please page the [facility] (Director on Call) for the Hospitalists listed on amion for assistance.  09/09/2022, 8:37 PM

## 2022-09-09 NOTE — ED Provider Notes (Signed)
[facility] AT [facility] Provider Note   [name]: [id number] Arrival date & time: 09/09/22  1556     History  Chief Complaint  Patient presents with   Chest Pain   Abdominal Pain    Deborah [name] is a 57 y.o. female.  57 year old female history ESRD on HD MWF, HTN, DM, diabetic neuropathy, diabetic gastroparesis, HTN, PAF on Eliquis, chronic HFpEF chronic back pain secondary to spinal stenosis, duodenum carcinoma status post resection in 2021 with chronic abdominal pain, narcotic dependence, morbid obesity, chronic ambulation dysfunction wheelchair-bound, presents with chest pain, nausea and vomiting started this morning shortly after waking up.  She reports that she missed dialysis yesterday and when she woke up this morning she started having chest pain and shortness of breath.  She called EMS and they found her to be in suspected A-fib RVR at a rate between 140 and 150.  They gave her 20 of diltiazem with appropriate rate control.  She did have a drop in her blood pressure after the diltiazem and therefore did not receive any nitroglycerin with EMS.  She also has an allergy to aspirin, did not get any aspirin.  Denies any other symptoms in the days leading up.  The history is provided by the patient and the spouse.  Chest Pain Associated symptoms: abdominal pain, fatigue, nausea, shortness of breath and vomiting   Associated symptoms: no dizziness, no fever and no weakness   Abdominal Pain Associated symptoms: chest pain, fatigue, nausea, shortness of breath and vomiting   Associated symptoms: no chills and no fever        Home Medications Prior to Admission medications   Medication Sig Start Date End Date Taking? Authorizing Provider  apixaban (ELIQUIS) 5 MG TABS tablet Take 5 mg by mouth 2 (two) times daily. 12/03/21  Yes [provider]  atorvastatin (LIPITOR) 10 MG tablet Take 10 mg by mouth at bedtime. 12/03/21  Yes [provider]  carvedilol (COREG) 12.5 MG tablet Take 1 tablet (12.5 mg total) by mouth 2 (two) times daily with a meal. 06/04/22  Yes Leroy Sea, MD  colchicine 0.6 MG tablet Take 0.5 tablets (0.3 mg total) by mouth 2 (two) times a week. 07/15/22  Yes Zannie Cove, MD  EASY COMFORT PEN NEEDLES 31G X 5 MM MISC USE 3 TIMES A DAY FOR INSULIN ADMINISTRATION 11/14/19  Yes Meccariello, Solmon Ice, MD  ferrous sulfate 325 (65 FE) MG tablet Take 1 tablet (325 mg total) by mouth 2 (two) times daily with a meal. 05/27/22  Yes Kathlen Mody, MD  folic acid (FOLVITE) 1 MG tablet Take 1 mg by mouth daily. 04/07/21  Yes [provider]  furosemide (LASIX) 80 MG tablet Take 1 tablet (80 mg total) by mouth daily. 07/06/22  Yes Rhetta Mura, MD  guaiFENesin (MUCINEX) 600 MG 12 hr tablet Take 1 tablet (600 mg total) by mouth 2 (two) times daily. 07/05/22  Yes Rhetta Mura, MD  HYDROmorphone (DILAUDID) 4 MG tablet Take 1 tablet (4 mg total) by mouth 3 (three) times daily as needed for moderate pain. Three times a day as needed 20 days out of the month. 08/23/22  Yes Jones Bales, NP  insulin aspart (NOVOLOG) 100 UNIT/ML injection Inject 5 Units into the skin 3 (three) times daily with meals. 06/04/22  Yes Leroy Sea, MD  insulin glargine-yfgn (SEMGLEE) 100 UNIT/ML injection Inject 0.15 mLs (15 Units total) into the skin 2 (two) times daily. 07/05/22  Yes Rhetta Mura, MD  insulin lispro (HUMALOG) 100 UNIT/ML KwikPen Before each meal 3 times a day, 140-199 - 2 units, 200-250 - 6 units, 251-299 - 8 units,  300-349 - 12 units,  350 or above 14 units. Patient taking differently: Inject 2-14 Units into the skin See admin instructions. Before each meal and at bedtime. 140-199 - 2 units, 200-250 - 6 units, 251-299 - 8 units,  300-349 - 12 units,  350 or above 14 units. 06/04/22  Yes Leroy Sea, MD  loperamide (IMODIUM A-D) 2 MG tablet Take 2 mg by mouth every 6 (six) hours as  needed for diarrhea or loose stools.   Yes [provider]  mirtazapine (REMERON SOL-TAB) 15 MG disintegrating tablet Take 1 tablet (15 mg total) by mouth at bedtime. 07/05/22  Yes Rhetta Mura, MD  Multiple Vitamin (MULTIVITAMIN WITH MINERALS) TABS tablet Take 1 tablet by mouth daily. 05/28/22  Yes Kathlen Mody, MD  ondansetron (ZOFRAN ODT) 4 MG disintegrating tablet Take 1 tablet (4 mg total) by mouth every 8 (eight) hours as needed for nausea or vomiting. 05/30/21  Yes [name], MD  pantoprazole (PROTONIX) 40 MG tablet TAKE 1 TABLET BY MOUTH 2 (TWO) TIMES DAILY. (AM+BEDTIME) 08/30/22  Yes Raulkar, Drema Pry, MD  albuterol (PROVENTIL) (2.5 MG/3ML) 0.083% nebulizer solution Take 3 mLs (2.5 mg total) by nebulization every 6 (six) hours as needed for wheezing or shortness of breath. 04/06/19   Bing Neighbors, NP  famotidine (PEPCID) 20 MG tablet Take 1 tablet (20 mg total) by mouth daily. 03/22/22 07/20/22  Cora Collum, DO  fluticasone (FLONASE) 50 MCG/ACT nasal spray Place 2 sprays into both nostrils daily as needed for allergies or rhinitis. Patient taking differently: Place 1 spray into both nostrils daily as needed for allergies. 12/19/18   Rai, Ripudeep Kirtland Bouchard, MD  Vitamin D, Ergocalciferol, (DRISDOL) 1.25 MG (50000 UNIT) CAPS capsule Take 50,000 Units by mouth once a week. 04/07/21   [provider]      Allergies    Diazepam, Gabapentin, Iodinated contrast media, Isovue [iopamidol], Lisinopril, Metoclopramide, Nsaids, Penicillins, Tolmetin, Dicyclomine, Acetaminophen, Cyclobenzaprine, Oxycodone, Rifamycins, and Tramadol    Review of Systems   Review of Systems  Constitutional:  Positive for fatigue. Negative for chills and fever.  Respiratory:  Positive for shortness of breath.   Cardiovascular:  Positive for chest pain.  Gastrointestinal:  Positive for abdominal pain, nausea and vomiting.  Neurological:  Negative for dizziness, syncope, weakness and  light-headedness.    Physical Exam Updated Vital Signs BP 105/78 (BP Location: Right Arm)   Pulse 100   Temp 97.8 F (36.6 C) (Oral)   Resp 17   Ht 5\' 6"  (1.676 m)   Wt 133.8 kg   LMP 10/10/2012   SpO2 99%   BMI 47.61 kg/m  Physical Exam Vitals and nursing note reviewed.  Constitutional:      General: She is not in acute distress.    Appearance: She is obese. She is not ill-appearing or toxic-appearing.  HENT:     Head: Normocephalic and atraumatic.  Eyes:     Conjunctiva/sclera: Conjunctivae normal.  Cardiovascular:     Rate and Rhythm: Normal rate. Rhythm irregular.     Heart sounds: No murmur heard. Pulmonary:     Effort: Pulmonary effort is normal. No respiratory distress.     Breath sounds: Normal breath sounds. No decreased breath sounds, wheezing, rhonchi or rales.  Abdominal:     General: Bowel sounds are normal.  Palpations: Abdomen is soft.     Tenderness: There is no abdominal tenderness.  Musculoskeletal:        General: No swelling.     Cervical back: Neck supple.  Skin:    General: Skin is warm and dry.     Capillary Refill: Capillary refill takes less than 2 seconds.  Neurological:     Mental Status: She is alert.  Psychiatric:        Mood and Affect: Mood normal.     ED Results / Procedures / Treatments   Labs (all labs ordered are listed, but only abnormal results are displayed) Labs Reviewed  CBC WITH DIFFERENTIAL/PLATELET - Abnormal; Notable for the following components:      Result Value   WBC 11.5 (*)    RBC 3.26 (*)    Hemoglobin 9.5 (*)    HCT 31.4 (*)    Platelets 417 (*)    Neutro Abs 8.0 (*)    All other components within normal limits  COMPREHENSIVE METABOLIC PANEL - Abnormal; Notable for the following components:   Potassium 2.3 (*)    Creatinine, Ser 4.57 (*)    Calcium 8.5 (*)    Albumin 2.7 (*)    AST 13 (*)    GFR, Estimated 11 (*)    All other components within normal limits  MAGNESIUM - Abnormal; Notable for the  following components:   Magnesium 1.4 (*)    All other components within normal limits  FERRITIN - Abnormal; Notable for the following components:   Ferritin 688 (*)    All other components within normal limits  GLUCOSE, CAPILLARY - Abnormal; Notable for the following components:   Glucose-Capillary 186 (*)    All other components within normal limits  TROPONIN I (HIGH SENSITIVITY) - Abnormal; Notable for the following components:   Troponin I (High Sensitivity) 27 (*)    All other components within normal limits  TROPONIN I (HIGH SENSITIVITY) - Abnormal; Notable for the following components:   Troponin I (High Sensitivity) 24 (*)    All other components within normal limits  TROPONIN I (HIGH SENSITIVITY) - Abnormal; Notable for the following components:   Troponin I (High Sensitivity) 21 (*)    All other components within normal limits  TROPONIN I (HIGH SENSITIVITY) - Abnormal; Notable for the following components:   Troponin I (High Sensitivity) 25 (*)    All other components within normal limits  PHOSPHORUS  LIPASE, BLOOD  TSH  HEPATITIS B SURFACE ANTIGEN  IRON AND TIBC  MAGNESIUM  COMPREHENSIVE METABOLIC PANEL  CBC  HEPATITIS B SURFACE ANTIBODY, QUANTITATIVE    EKG EKG Interpretation Date/Time:  Thursday September 09 2022 16:18:40 EDT Ventricular Rate:  104 PR Interval:  187 QRS Duration:  96 QT Interval:  400 QTC Calculation: 527 R Axis:   -48  Text Interpretation: Fast sinus arrhythmia Left anterior fascicular block Abnormal R-wave progression, late transition Probable left ventricular hypertrophy Prolonged QT interval when compared to prior, similar appearance with longer QTC. No STEMI Confirmed by Theda Belfast ([id number]) on 09/09/2022 4:20:47 PM  Radiology DG Chest Portable 1 View  Result Date: 09/09/2022 CLINICAL DATA:  Chest pain EXAM: PORTABLE CHEST 1 VIEW COMPARISON:  07/13/2022 FINDINGS: Dual lumen right-sided central venous catheter in satisfactory position. No  focal consolidation. No pleural effusion or pneumothorax. Heart and mediastinal contours are unremarkable. No acute osseous abnormality. IMPRESSION: 1. No acute cardiopulmonary disease. Electronically Signed   By: Elige Ko M.D.   On: 09/09/2022 17:17  Procedures Procedures    Medications Ordered in ED Medications  potassium chloride SA (KLOR-CON M) CR tablet 40 mEq (40 mEq Oral Not Given 09/09/22 1907)  atorvastatin (LIPITOR) tablet 10 mg (10 mg Oral Given 09/09/22 2210)  carvedilol (COREG) tablet 12.5 mg (has no administration in time range)  mirtazapine (REMERON SOL-TAB) disintegrating tablet 15 mg (15 mg Oral Given 09/09/22 2255)  loperamide (IMODIUM) capsule 2 mg (has no administration in time range)  pantoprazole (PROTONIX) EC tablet 40 mg (40 mg Oral Given 09/09/22 2210)  apixaban (ELIQUIS) tablet 5 mg (5 mg Oral Given 09/09/22 2210)  ferrous sulfate tablet 325 mg (has no administration in time range)  insulin aspart (novoLOG) injection 0-6 Units (has no administration in time range)  insulin aspart (novoLOG) injection 4 Units (has no administration in time range)  insulin detemir (LEVEMIR) injection 15 Units (15 Units Subcutaneous Given 09/09/22 2308)  sodium chloride flush (NS) 0.9 % injection 3 mL (3 mLs Intravenous Given 09/09/22 2110)  sodium chloride flush (NS) 0.9 % injection 3 mL (has no administration in time range)  0.9 %  sodium chloride infusion (has no administration in time range)  HYDROmorphone (DILAUDID) injection 0.5-1 mg (0.5 mg Intravenous Given 09/09/22 2253)  methocarbamol (ROBAXIN) 500 mg in dextrose 5 % 50 mL IVPB (has no administration in time range)  ondansetron (ZOFRAN) tablet 4 mg (has no administration in time range)    Or  ondansetron (ZOFRAN) injection 4 mg (has no administration in time range)  folic acid (FOLVITE) tablet 1 mg (1 mg Oral Given 09/09/22 2210)  multivitamin with minerals tablet 1 tablet (1 tablet Oral Given 09/09/22 2211)  hydrALAZINE (APRESOLINE)  injection 10 mg (has no administration in time range)  potassium chloride (KLOR-CON) packet 40 mEq (40 mEq Oral Not Given 09/09/22 2211)  metoprolol tartrate (LOPRESSOR) injection 5 mg (has no administration in time range)  nitroGLYCERIN (NITROSTAT) SL tablet 0.4 mg (has no administration in time range)  Chlorhexidine Gluconate Cloth 2 % PADS 6 each (has no administration in time range)  doxercalciferol (HECTOROL) injection 6 mcg (has no administration in time range)  Darbepoetin Alfa (ARANESP) injection 200 mcg (has no administration in time range)  HYDROmorphone (DILAUDID) injection 0.5 mg (0.5 mg Intravenous Given 09/09/22 1722)  potassium chloride 10 mEq in 100 mL IVPB (10 mEq Intravenous New Bag/Given 09/09/22 2209)  HYDROmorphone (DILAUDID) injection 1 mg (1 mg Intravenous Given 09/09/22 1837)  magnesium sulfate IVPB 2 g 50 mL (0 g Intravenous Stopped 09/09/22 2100)    ED Course/ Medical Decision Making/ A&P Clinical Course as of 09/09/22 2316  Thu Sep 09, 2022  1756 DG Chest Portable 1 View No evidence of volume overload, cardiopulmonary abnormality [BB]  1756 CBC with Differential(!) Anemia, that is improved from prior [BB]  1815 Spoke with nephrology (Dr. Valentino Nose) who recommended repletion of K+ w/ 70 mEq and recheck bmp in 3-4 hours.  [BB]    Clinical Course User Index [BB] Fayrene Helper, MD                             Medical Decision Making 57 year old female history of ESRD on HD with missed dialysis.  Here for chest pain and abdominal pain.  Reportedly in A-fib RVR and received 20 of diltiazem with EMS.  EKG here shows sinus rhythm with multiple PACs.  Reassured by the fact that she is already chronically anticoagulated.  Labs revealing for hypomagnesemia, significant hypokalemia.  Spoke  with nephrology who recommended beginning repletion with 70 mEq of potassium split IV versus p.o.  Admission to the hospital for close monitoring of electrolyte abnormality, telemetry.  No acute  indication for dialysis tonight.  Hospitalist graciously agreed admit the patient to their service for further care.  Amount and/or Complexity of Data Reviewed Independent Historian: spouse    Details: Husband at bedside External Data Reviewed: notes.    Details: Discharge summary from prior hospitalization Labs: ordered. Decision-making details documented in ED Course. Radiology: ordered. Decision-making details documented in ED Course. ECG/medicine tests: ordered and independent interpretation performed.  Risk Prescription drug management. Decision regarding hospitalization.           Final Clinical Impression(s) / ED Diagnoses Final diagnoses:  Abdominal pain, unspecified abdominal location  Chest pain, unspecified type  Hypokalemia  Dialysis patient [facility])    Rx / DC Orders ED Discharge Orders     None         Fayrene Helper, MD 09/09/22 2316    Tegeler, Canary Brim, MD 09/10/22 [phone]

## 2022-09-09 NOTE — Progress Notes (Signed)
VAST consulted to obtain IV access. Patient with restricted left arm d/t AVF. Pt verbalized, "PIVs placed in my lower arm blow". Entire right arm assessed utilizing ultrasound. Visualized one vessel in upper arm appropriate for USGIV access. Educated patient that once vessel in upper arm is used to place an IV we can't go lower for further access. Recommended to patient that we use the lower arm for an IV to get her pain medication as quickly as possible. Educated when it becomes unusable, an IV can then be placed in her upper arm. 22G USGIV placed in right anterior forearm as charted in IV flowsheets.

## 2022-09-09 NOTE — ED Triage Notes (Signed)
PT BIB by [facility] from home with a c/o of chest pain and abdominal pain while sitting on her couch.PT missed her dialysis appt yesterday due to not feeling well.When EMS arrived, she was in uncontrolled AFIB 140-160, EMS administered 20mg  of Cardizem, HR now controlled at 90-100. No ASA was given due to a allergy and no nitroglycerin was given due to BP starting to drop.

## 2022-09-10 ENCOUNTER — Inpatient Hospital Stay (HOSPITAL_COMMUNITY): Payer: 59

## 2022-09-10 DIAGNOSIS — R7989 Other specified abnormal findings of blood chemistry: Secondary | ICD-10-CM | POA: Diagnosis not present

## 2022-09-10 DIAGNOSIS — I4891 Unspecified atrial fibrillation: Secondary | ICD-10-CM

## 2022-09-10 LAB — COMPREHENSIVE METABOLIC PANEL
ALT: 9 U/L (ref 0–44)
ALT: 10 U/L (ref 0–44)
AST: 12 U/L — ABNORMAL LOW (ref 15–41)
AST: 9 U/L — ABNORMAL LOW (ref 15–41)
Albumin: 2.6 g/dL — ABNORMAL LOW (ref 3.5–5.0)
Albumin: 2.5 g/dL — ABNORMAL LOW (ref 3.5–5.0)
Alkaline Phosphatase: 79 U/L (ref 38–126)
Alkaline Phosphatase: 81 U/L (ref 38–126)
Anion gap: 16 — ABNORMAL HIGH (ref 5–15)
Anion gap: 9 (ref 5–15)
BUN: 20 mg/dL (ref 6–20)
BUN: 6 mg/dL (ref 6–20)
CO2: 24 mmol/L (ref 22–32)
CO2: 29 mmol/L (ref 22–32)
Calcium: 8.6 mg/dL — ABNORMAL LOW (ref 8.9–10.3)
Calcium: 7.7 mg/dL — ABNORMAL LOW (ref 8.9–10.3)
Chloride: 95 mmol/L — ABNORMAL LOW (ref 98–111)
Chloride: 98 mmol/L (ref 98–111)
Creatinine, Ser: 5.32 mg/dL — ABNORMAL HIGH (ref 0.44–1.00)
Creatinine, Ser: 1.99 mg/dL — ABNORMAL HIGH (ref 0.44–1.00)
GFR, Estimated: 9 mL/min — ABNORMAL LOW (ref >60)
GFR, Estimated: 29 mL/min — ABNORMAL LOW (ref >60)
Glucose, Bld: 228 mg/dL — ABNORMAL HIGH (ref 70–99)
Glucose, Bld: 101 mg/dL — ABNORMAL HIGH (ref 70–99)
Potassium: 2.6 mmol/L — CL (ref 3.5–5.1)
Potassium: 3 mmol/L — ABNORMAL LOW (ref 3.5–5.1)
Sodium: 135 mmol/L (ref 135–145)
Sodium: 136 mmol/L (ref 135–145)
Total Bilirubin: 0.6 mg/dL (ref 0.3–1.2)
Total Bilirubin: 0.7 mg/dL (ref 0.3–1.2)
Total Protein: 6.7 g/dL (ref 6.5–8.1)
Total Protein: 6.5 g/dL (ref 6.5–8.1)

## 2022-09-10 LAB — ECHOCARDIOGRAM LIMITED
AR max vel: 2.59 cm2
AV Peak grad: 6.8 mmHg
Ao pk vel: 1.31 m/s
Area-P 1/2: 3.85 cm2
Calc EF: 52.1 %
Height: 66 in
S' Lateral: 3 cm
Single Plane A2C EF: 51.5 %
Single Plane A4C EF: 51.3 %
Weight: 4698.44 oz

## 2022-09-10 LAB — CBC
HCT: 29 % — ABNORMAL LOW (ref 36.0–46.0)
Hemoglobin: 8.7 g/dL — ABNORMAL LOW (ref 12.0–15.0)
MCH: 29 pg (ref 26.0–34.0)
MCHC: 30 g/dL (ref 30.0–36.0)
MCV: 96.7 fL (ref 80.0–100.0)
Platelets: 403 10*3/uL — ABNORMAL HIGH (ref 150–400)
RBC: 3 MIL/uL — ABNORMAL LOW (ref 3.87–5.11)
RDW: 15.1 % (ref 11.5–15.5)
WBC: 12.4 10*3/uL — ABNORMAL HIGH (ref 4.0–10.5)
nRBC: 0 % (ref 0.0–0.2)

## 2022-09-10 LAB — GLUCOSE, CAPILLARY
Glucose-Capillary: 99 mg/dL (ref 70–99)
Glucose-Capillary: 169 mg/dL — ABNORMAL HIGH (ref 70–99)
Glucose-Capillary: 231 mg/dL — ABNORMAL HIGH (ref 70–99)
Glucose-Capillary: 261 mg/dL — ABNORMAL HIGH (ref 70–99)

## 2022-09-10 LAB — MAGNESIUM: Magnesium: 1.8 mg/dL (ref 1.7–2.4)

## 2022-09-10 MED ORDER — LIDOCAINE-PRILOCAINE 2.5-2.5 % EX CREA: 1.0000 | TOPICAL_CREAM | CUTANEOUS | Status: DC | PRN

## 2022-09-10 MED ORDER — HYDROMORPHONE HCL 2 MG PO TABS
2.0000 mg | ORAL_TABLET | Freq: Two times a day (BID) | ORAL | Status: DC | PRN
  Administered 2022-09-10: 2 mg via ORAL
  Filled 2022-09-10 (×2): qty 1

## 2022-09-10 MED ORDER — PENTAFLUOROPROP-TETRAFLUOROETH EX AERO: 1.0000 | INHALATION_SPRAY | CUTANEOUS | Status: DC | PRN

## 2022-09-10 MED ORDER — POTASSIUM CHLORIDE 20 MEQ PO PACK
80.0000 meq | PACK | Freq: Once | ORAL | Status: DC
  Filled 2022-09-10: qty 4

## 2022-09-10 MED ORDER — CARVEDILOL 6.25 MG PO TABS
6.2500 mg | ORAL_TABLET | Freq: Two times a day (BID) | ORAL | Status: DC
  Administered 2022-09-10 (×2): 6.25 mg via ORAL
  Filled 2022-09-10 (×2): qty 1

## 2022-09-10 MED ORDER — HYDROMORPHONE HCL 1 MG/ML IJ SOLN
INTRAMUSCULAR | Status: AC
  Filled 2022-09-10: qty 0.5

## 2022-09-10 MED ORDER — FERROUS SULFATE 325 (65 FE) MG PO TABS: 325.0000 mg | ORAL_TABLET | Freq: Every day | ORAL | Status: DC

## 2022-09-10 MED ORDER — HYDRALAZINE HCL 20 MG/ML IJ SOLN: 10.0000 mg | Freq: Three times a day (TID) | INTRAMUSCULAR | Status: DC | PRN

## 2022-09-10 MED ORDER — POTASSIUM CHLORIDE 10 MEQ/100ML IV SOLN
10.0000 meq | INTRAVENOUS | Status: AC
  Administered 2022-09-10 (×3): 10 meq via INTRAVENOUS
  Filled 2022-09-10 (×3): qty 100

## 2022-09-10 MED ORDER — ZOLPIDEM TARTRATE 5 MG PO TABS
5.0000 mg | ORAL_TABLET | Freq: Every evening | ORAL | Status: DC | PRN
  Administered 2022-09-10: 5 mg via ORAL
  Filled 2022-09-10: qty 1

## 2022-09-10 MED ORDER — LIDOCAINE HCL (PF) 1 % IJ SOLN: 5.0000 mL | INTRAMUSCULAR | Status: DC | PRN

## 2022-09-10 MED ORDER — HEPARIN SODIUM (PORCINE) 1000 UNIT/ML DIALYSIS: 1000.0000 [IU] | INTRAMUSCULAR | Status: DC | PRN

## 2022-09-10 MED ORDER — HEPARIN SODIUM (PORCINE) 1000 UNIT/ML IJ SOLN
INTRAMUSCULAR | Status: AC
  Administered 2022-09-10: 3800 [IU]
  Filled 2022-09-10: qty 4

## 2022-09-10 MED ORDER — ALTEPLASE 2 MG IJ SOLR: 2.0000 mg | Freq: Once | INTRAMUSCULAR | Status: DC | PRN

## 2022-09-10 MED ORDER — ANTICOAGULANT SODIUM CITRATE 4% (200MG/5ML) IV SOLN: 5.0000 mL | Status: DC | PRN

## 2022-09-10 MED ORDER — POTASSIUM CHLORIDE 20 MEQ PO PACK: 40.0000 meq | PACK | Freq: Once | ORAL | Status: DC

## 2022-09-10 MED ORDER — POTASSIUM CHLORIDE 20 MEQ PO PACK: 80.0000 meq | PACK | Freq: Once | ORAL | Status: DC

## 2022-09-10 MED ORDER — HYDROMORPHONE HCL 2 MG PO TABS: 4.0000 mg | ORAL_TABLET | Freq: Three times a day (TID) | ORAL | Status: DC | PRN

## 2022-09-10 NOTE — Progress Notes (Deleted)
Persistent hypokalemia Lab at 2 AM showed low potassium 2.6.  Previously repleted with 70 meq of potassium.  Giving patient another 80 meq of oral KCl.

## 2022-09-10 NOTE — Progress Notes (Signed)
   09/10/22 0024  Assess: MEWS Score  Temp 98 F (36.7 C)  BP (!) 97/58  MAP (mmHg) 68  Pulse Rate (!) 101  ECG Heart Rate (!) 101  Resp 14  SpO2 95 %  O2 Device Room Air  Assess: MEWS Score  MEWS Temp 0  MEWS Systolic 1  MEWS Pulse 1  MEWS RR 0  MEWS LOC 0  MEWS Score 2  MEWS Score Color Yellow  Assess: if the MEWS score is Yellow or Red  Were vital signs taken at a resting state? No  Focused Assessment No change from prior assessment  Does the patient meet 2 or more of the SIRS criteria? No  Does the patient have a confirmed or suspected source of infection? No  MEWS guidelines implemented  Yes, yellow  Treat  MEWS Interventions Considered administering scheduled or prn medications/treatments as ordered  Take Vital Signs  Increase Vital Sign Frequency  Yellow: Q2hr x1, continue Q4hrs until patient remains green for 12hrs  Escalate  MEWS: Escalate Yellow: Discuss with charge nurse and consider notifying provider and/or RRT  Notify: Charge Nurse/RN  Name of Charge Nurse/RN Notified Therapist, nutritional  Assess: SIRS CRITERIA  SIRS Temperature  0  SIRS Pulse 1  SIRS Respirations  0  SIRS WBC 0  SIRS Score Sum  1

## 2022-09-10 NOTE — Progress Notes (Signed)
Patient reported to skin irritation rash with Chlorhexidine Gluconate Cloth 2 % .  Patient requested not to use it during dialysis. So I have have to discontinue it.  Tereasa Coop, MD [facility]

## 2022-09-10 NOTE — TOC Initial Note (Signed)
Transition of Care [facility]) - Initial/Assessment Note    Patient Details  Name: Deborah [name] MRN: [id number] Date of Birth: 13-Dec-1965  [facility] CM/SW Contact:    Kermit Balo, RN Phone Number: 09/10/2022, 11:26 AM  Clinical Narrative:                 Pt is from home alone. She has an aide every day--3 hours a day. Aide gets her to her appointments.  Her mother can also check on her at home. She states either her mother will provide transportation home at d/c or she will need ambulance.  Pt manages her own medications and denies any issues.  PCP: Dr Florina Ou.  TOC following.  Expected Discharge Plan: Home/Self Care Barriers to Discharge: Continued Medical Work up   Patient Goals and CMS Choice            Expected Discharge Plan and Services   Discharge Planning Services: CM Consult   Living arrangements for the past 2 months: Apartment                                      Prior Living Arrangements/Services Living arrangements for the past 2 months: Apartment Lives with:: Self Patient language and need for interpreter reviewed:: Yes Do you feel safe going back to the place where you live?: Yes          Current home services: DME (wheelchair/ BSC) Criminal Activity/Legal Involvement Pertinent to Current Situation/Hospitalization: No - Comment as needed  Activities of Daily Living Home Assistive Devices/Equipment: None ADL Screening (condition at time of admission) Patient's cognitive ability adequate to safely complete daily activities?: No Is the patient deaf or have difficulty hearing?: No Does the patient have difficulty seeing, even when wearing glasses/contacts?: No Does the patient have difficulty concentrating, remembering, or making decisions?: No Patient able to express need for assistance with ADLs?: Yes Does the patient have difficulty dressing or bathing?: No Independently performs ADLs?: Yes (appropriate for  developmental age) Does the patient have difficulty walking or climbing stairs?: Yes Weakness of Legs: Both Weakness of Arms/Hands: None  Permission Sought/Granted                  Emotional Assessment Appearance:: Appears stated age Attitude/Demeanor/Rapport: Engaged Affect (typically observed): Accepting Orientation: : Oriented to Self, Oriented to Place, Oriented to  Time, Oriented to Situation   Psych Involvement: No (comment)  Admission diagnosis:  Atrial fibrillation with RVR (HCC) [I48.91] Patient Active Problem List   Diagnosis Date Noted   Elevated troponin 09/09/2022   Nausea & vomiting 09/09/2022   End stage renal disease on dialysis (HCC) 09/09/2022   Chronic a-fib (HCC) 09/09/2022   Atrial fibrillation with RVR (HCC)-resolved 09/09/2022   Peripheral neuropathy 09/09/2022   (HFpEF) heart failure with preserved ejection fraction (HCC) 09/09/2022   Hypotension 09/09/2022   CHF (congestive heart failure) (HCC) 07/13/2022   HCAP (healthcare-associated pneumonia) 06/19/2022   Acute renal failure superimposed on stage 4 chronic kidney disease (HCC) 05/29/2022   Acute encephalopathy 05/29/2022   Hyponatremia 05/29/2022   Chronic anemia 05/22/2022   Hyperosmolar hyperglycemic state (HHS) (HCC) 05/11/2022   Hyperosmolar non-ketotic state due to type 2 diabetes mellitus (HCC) 05/11/2022   Leukocytosis 05/11/2022   Edema of left upper extremity 03/21/2022   Tachycardia 03/18/2022   Atypical chest pain 09/10/2021   Acute pyelonephritis    Acute kidney  injury superimposed on chronic kidney disease (HCC) 08/16/2021   Acute on chronic kidney failure (HCC) 08/15/2021   PAF (paroxysmal atrial fibrillation) (HCC)    Chronic pain 09/04/2019   Malignant carcinoid tumor of duodenum (HCC)    Nonalcoholic fatty liver disease 06/05/2019   Restrictive lung disease secondary to obesity    History of gastric ulcer    Fibroid uterus 02/23/2019   Abdominal pain 07/17/2018    Anxiety 11/29/2017   Spinal stenosis, lumbar region with neurogenic claudication 08/03/2017   GERD (gastroesophageal reflux disease) 03/19/2017   Depression 03/19/2017   Morbid obesity (HCC)    Normocytic anemia 08/16/2016   Diabetic gastroparesis (HCC) 08/16/2016   Gout 06/05/2016   Chronic hypokalemia 09/26/2015   Hypomagnesemia and hypokalemia 09/26/2015   Insulin dependent type 2 diabetes mellitus (HCC) 05/25/2015   Chronic kidney disease (CKD), stage IV (severe) (HCC) 05/25/2015   Essential hypertension 09/28/2013   PCP:  Oneita Hurt, No Pharmacy:   [facility] - [place], [place] - [address] [place] [address] Phone: [phone] Fax: [phone]  [facility] [address] [place] [address] Phone: [phone] Fax: [phone]     Social Determinants of Health (SDOH) Social History: SDOH Screenings   Food Insecurity: No Food Insecurity (09/10/2022)  Housing: Low Risk  (09/10/2022)  Transportation Needs: No Transportation Needs (09/10/2022)  Recent Concern: Transportation Needs - Unmet Transportation Needs (06/19/2022)  Utilities: Not At Risk (09/10/2022)  Recent Concern: Utilities - At Risk (06/19/2022)  Depression (PHQ2-9): Medium Risk (07/20/2022)  Tobacco Use: Low Risk  (09/09/2022)   SDOH Interventions:     Readmission Risk Interventions    07/05/2022    1:20 PM  Readmission Risk Prevention Plan  Transportation Screening Complete  Medication Review (RN Care Manager) Referral to Pharmacy  PCP or Specialist appointment within 3-5 days of discharge Complete  HRI or Home Care Consult Complete  SW Recovery Care/Counseling Consult Complete  Palliative Care Screening Not Applicable  Skilled Nursing Facility Not Applicable

## 2022-09-10 NOTE — Progress Notes (Signed)
Peripheral IV removed. Patient taken down stairs to her family vehicle due to the patient's inability to ambulate.

## 2022-09-10 NOTE — Progress Notes (Signed)
Persistent hypokalemia - Lab check from 2 AM showed low potassium 2.6 event patient was repleted with KCl 70 mEq.  I was initially wanted to give more potassium supplement however patient went for dialysis and hoping that after HD the potassium level will normalize.  I have spoken with patient's dialysis nurse over the phone requested dialysis unit nurse to check BMP post HD session.  Tereasa Coop, MD Please reach out to secure chat for any question or concern.

## 2022-09-10 NOTE — Consult Note (Signed)
[facility] Renal Consultation Note    Indication for Consultation:  Management of ESRD/hemodialysis; anemia, hypertension/volume and secondary hyperparathyroidism   HPI: Deborah [name] is a 57 y.o. female with ESRD on HD MWF.  Also with complicated history including T2DM with history of gastroparesis,  NICM, Afib on apixaban, history of duodenal carcinod tumor s/p resection, gout, chronic pain, obesity. She presented to the ED with chest pain and abdominal pain with nausea/vomiting - found to be in AFib with RVR and with hypokalemia. Admitted for further evaluation.   RVR has resolved. ACS ruled out. Potassium 2.3 on admission, improving with KCL repletion.   Dialysis MWF at [facility]. Dialysis via [facility]. Has maturing L BC AVF that was placed 06/30/22. Her last outpatient dialysis was Monday 7/1. Missed Wednesday d/t not feeling well. Had dialysis here in the hospital early am - just completed at 7 am today.   Seen and examined in room. She reports several days of nausea/vomiting. She also endorses some loose stools. Does have history of hypokalemia. On added potassium bath at dialysis. She says her chest pain has improved and her breathing is ok,  but still very nauseated and vomited up her pills this morning  Past Medical History:  Diagnosis Date   Acute back pain with sciatica, left    Acute back pain with sciatica, right    AKI (acute kidney injury) (HCC)    Anemia, unspecified    Cancer (HCC)    Carcinoid tumor of duodenum    Chest pain with normal coronary angiography 2019   Chronic kidney disease, stage 3b (HCC)    Chronic pain    Chronic systolic CHF (congestive heart failure) (HCC)    Diabetes mellitus    DKA (diabetic ketoacidosis) (HCC)    Drug-seeking behavior    21 hospitalizations and 14 CT a/p in 2 years for N/V and abdominal pain, demanding only IV dilaudid   Elevated troponin    chronic   Esophageal reflux    Fibromyalgia     Gastric ulcer    Gastroparesis    Gout    Hyperlipidemia    Hypertension    Hypokalemia    Hypomagnesemia    Lumbosacral stenosis    LVH (left ventricular hypertrophy)    Morbid obesity (HCC)    NICM (nonischemic cardiomyopathy) (HCC)    PAF (paroxysmal atrial fibrillation) (HCC)    Stroke (HCC) 02/2011   Thrombocytosis    Vitamin B12 deficiency anemia    Past Surgical History:  Procedure Laterality Date   AV FISTULA PLACEMENT Left 06/30/2022   Procedure: LEFT BRACHIOCEPHALIC ARTERIOVENOUS (AV) FISTULA CREATION;  Surgeon: Cephus Shelling, MD;  Location: [facility] OR;  Service: Vascular;  Laterality: Left;   BIOPSY  07/27/2019   Procedure: BIOPSY;  Surgeon: Vida Rigger, MD;  Location: [facility];  Service: Endoscopy;;   BIOPSY  07/30/2019   Procedure: BIOPSY;  Surgeon: Kathi Der, MD;  Location: [facility];  Service: Gastroenterology;;   CATARACT EXTRACTION  01/2014   CHOLECYSTECTOMY     COLONOSCOPY WITH PROPOFOL N/A 07/30/2019   Procedure: COLONOSCOPY WITH PROPOFOL;  Surgeon: Kathi Der, MD;  Location: [facility];  Service: Gastroenterology;  Laterality: N/A;   ESOPHAGOGASTRODUODENOSCOPY N/A 07/27/2019   Procedure: ESOPHAGOGASTRODUODENOSCOPY (EGD);  Surgeon: Vida Rigger, MD;  Location: [facility];  Service: Endoscopy;  Laterality: N/A;   ESOPHAGOGASTRODUODENOSCOPY N/A 07/26/2020   Procedure: ESOPHAGOGASTRODUODENOSCOPY (EGD);  Surgeon: Willis Modena, MD;  Location: [facility];  Service: Endoscopy;  Laterality: N/A;  ESOPHAGOGASTRODUODENOSCOPY (EGD) WITH PROPOFOL N/A 08/02/2019   Procedure: ESOPHAGOGASTRODUODENOSCOPY (EGD) WITH PROPOFOL;  Surgeon: Kathi Der, MD;  Location: [facility];  Service: Gastroenterology;  Laterality: N/A;   HEMOSTASIS CLIP PLACEMENT  08/02/2019   Procedure: HEMOSTASIS CLIP PLACEMENT;  Surgeon: Kathi Der, MD;  Location: [facility];  Service: Gastroenterology;;   IR FLUORO GUIDE CV LINE RIGHT  06/24/2022   IR US GUIDE VASC  ACCESS RIGHT  06/24/2022   POLYPECTOMY  07/30/2019   Procedure: POLYPECTOMY;  Surgeon: Kathi Der, MD;  Location: [facility];  Service: Gastroenterology;;   POLYPECTOMY  08/02/2019   Procedure: POLYPECTOMY;  Surgeon: Kathi Der, MD;  Location: [facility];  Service: Gastroenterology;;   Family History  Problem Relation Age of Onset   Diabetes Mother    Diabetes Father    Heart disease Father    Diabetes Sister    Congestive Heart Failure Sister 73   Diabetes Brother    Social History:  reports that she has never smoked. She has never used smokeless tobacco. She reports that she does not drink alcohol and does not use drugs. Allergies  Allergen Reactions   Diazepam [name]   Gabapentin [name] and Swelling    Other reaction(s): Unknown   Iodinated Contrast Media Anaphylaxis    11/29/17 Cardiac arrest 1 min after IV contrast, possible allergy vs vasovagal episode Iopamidol  Anaphylaxis  High 11/28/2017  Patient had seizure like activity and then code post 100 cc of isovue 300     Isovue [Iopamidol] Anaphylaxis    11/28/17 Patient had seizure like activity and then 1 min code after 100 cc of isovue 300. Possible contrast allergy vs vasovagal episode   Lisinopril Anaphylaxis    Tongue and mouth swelling   Metoclopramide Other (See Comments)    Tardive dyskinesia  Also known as Reglan    Nsaids Anaphylaxis and Other (See Comments)    ULCER   Penicillins Palpitations    Has patient had a PCN reaction causing immediate rash, facial/tongue/throat swelling, SOB or lightheadedness with hypotension: Yes, heart races Has patient had a PCN reaction causing severe rash involving mucus membranes or skin necrosis: No Has patient had a PCN reaction that required hospitalization: Yes  Has patient had a PCN reaction occurring within the last 10 years: No    Chlorhexidine Dermatitis   Tolmetin Nausea Only and Other (See Comments)    ULCER   Dicyclomine  Other (See Comments)    Chest pain   Acetaminophen Nausea Only and Other (See Comments)    Irritates stomach ulcer; Abdominal pain   Cyclobenzaprine Palpitations   Oxycodone Palpitations   Rifamycins Palpitations   Tramadol Nausea And Vomiting   Prior to Admission medications   Medication Sig Start Date End Date Taking? Authorizing Provider  apixaban (ELIQUIS) 5 MG TABS tablet Take 5 mg by mouth 2 (two) times daily. 12/03/21  Yes [provider]  atorvastatin (LIPITOR) 10 MG tablet Take 10 mg by mouth at bedtime. 12/03/21  Yes [provider]  carvedilol (COREG) 12.5 MG tablet Take 1 tablet (12.5 mg total) by mouth 2 (two) times daily with a meal. 06/04/22  Yes Leroy Sea, MD  colchicine 0.6 MG tablet Take 0.5 tablets (0.3 mg total) by mouth 2 (two) times a week. 07/15/22  Yes Zannie Cove, MD  EASY COMFORT PEN NEEDLES 31G X 5 MM MISC USE 3 TIMES A DAY FOR INSULIN ADMINISTRATION 11/14/19  Yes Meccariello, Solmon Ice, MD  ferrous sulfate 325 (65  FE) MG tablet Take 1 tablet (325 mg total) by mouth 2 (two) times daily with a meal. 05/27/22  Yes Kathlen Mody, MD  folic acid (FOLVITE) 1 MG tablet Take 1 mg by mouth daily. 04/07/21  Yes [provider]  furosemide (LASIX) 80 MG tablet Take 1 tablet (80 mg total) by mouth daily. 07/06/22  Yes Rhetta Mura, MD  guaiFENesin (MUCINEX) 600 MG 12 hr tablet Take 1 tablet (600 mg total) by mouth 2 (two) times daily. 07/05/22  Yes Rhetta Mura, MD  HYDROmorphone (DILAUDID) 4 MG tablet Take 1 tablet (4 mg total) by mouth 3 (three) times daily as needed for moderate pain. Three times a day as needed 20 days out of the month. 08/23/22  Yes Jones Bales, NP  insulin aspart (NOVOLOG) 100 UNIT/ML injection Inject 5 Units into the skin 3 (three) times daily with meals. 06/04/22  Yes Leroy Sea, MD  insulin glargine-yfgn (SEMGLEE) 100 UNIT/ML injection Inject 0.15 mLs (15 Units total) into the skin 2 (two) times  daily. 07/05/22  Yes Rhetta Mura, MD  insulin lispro (HUMALOG) 100 UNIT/ML KwikPen Before each meal 3 times a day, 140-199 - 2 units, 200-250 - 6 units, 251-299 - 8 units,  300-349 - 12 units,  350 or above 14 units. Patient taking differently: Inject 2-14 Units into the skin See admin instructions. Before each meal and at bedtime. 140-199 - 2 units, 200-250 - 6 units, 251-299 - 8 units,  300-349 - 12 units,  350 or above 14 units. 06/04/22  Yes Leroy Sea, MD  loperamide (IMODIUM A-D) 2 MG tablet Take 2 mg by mouth every 6 (six) hours as needed for diarrhea or loose stools.   Yes [provider]  mirtazapine (REMERON SOL-TAB) 15 MG disintegrating tablet Take 1 tablet (15 mg total) by mouth at bedtime. 07/05/22  Yes Rhetta Mura, MD  Multiple Vitamin (MULTIVITAMIN WITH MINERALS) TABS tablet Take 1 tablet by mouth daily. 05/28/22  Yes Kathlen Mody, MD  ondansetron (ZOFRAN ODT) 4 MG disintegrating tablet Take 1 tablet (4 mg total) by mouth every 8 (eight) hours as needed for nausea or vomiting. 05/30/21  Yes [name], MD  pantoprazole (PROTONIX) 40 MG tablet TAKE 1 TABLET BY MOUTH 2 (TWO) TIMES DAILY. (AM+BEDTIME) 08/30/22  Yes Raulkar, Drema Pry, MD  albuterol (PROVENTIL) (2.5 MG/3ML) 0.083% nebulizer solution Take 3 mLs (2.5 mg total) by nebulization every 6 (six) hours as needed for wheezing or shortness of breath. 04/06/19   Bing Neighbors, NP  famotidine (PEPCID) 20 MG tablet Take 1 tablet (20 mg total) by mouth daily. 03/22/22 07/20/22  Cora Collum, DO  fluticasone (FLONASE) 50 MCG/ACT nasal spray Place 2 sprays into both nostrils daily as needed for allergies or rhinitis. Patient taking differently: Place 1 spray into both nostrils daily as needed for allergies. 12/19/18   Rai, Ripudeep Kirtland Bouchard, MD  Vitamin D, Ergocalciferol, (DRISDOL) 1.25 MG (50000 UNIT) CAPS capsule Take 50,000 Units by mouth once a week. 04/07/21   [provider]   Current  Facility-Administered Medications  Medication Dose Route Frequency Provider Last Rate Last Admin   0.9 %  sodium chloride infusion  250 mL Intravenous PRN Janalyn Shy, Subrina, MD       apixaban Everlene Balls) tablet 5 mg  5 mg Oral BID Janalyn Shy, Subrina, MD   5 mg at 09/10/22 0804   atorvastatin (LIPITOR) tablet 10 mg  10 mg Oral QHS Sundil, Subrina, MD   10 mg at 09/09/22  2210   carvedilol (COREG) tablet 6.25 mg  6.25 mg Oral BID WC Sundil, Subrina, MD   6.25 mg at 09/10/22 1610   Darbepoetin Alfa (ARANESP) injection 200 mcg  200 mcg Subcutaneous Q Fri-1800 Darnell Level, MD       doxercalciferol (HECTOROL) injection 6 mcg  6 mcg Intravenous Q M,W,F-HD Darnell Level, MD       ferrous sulfate tablet 325 mg  325 mg Oral BID WC Sundil, Subrina, MD   325 mg at 09/10/22 0803   folic acid (FOLVITE) tablet 1 mg  1 mg Oral Daily Sundil, Subrina, MD   1 mg at 09/10/22 9604   hydrALAZINE (APRESOLINE) injection 10 mg  10 mg Intravenous Q8H PRN Sundil, Subrina, MD       HYDROmorphone (DILAUDID) injection 0.5-1 mg  0.5-1 mg Intravenous Q2H PRN Janalyn Shy, Subrina, MD   0.5 mg at 09/10/22 0910   insulin aspart (novoLOG) injection 0-6 Units  0-6 Units Subcutaneous TID WC Sundil, Subrina, MD       insulin aspart (novoLOG) injection 4 Units  4 Units Subcutaneous TID WC Sundil, Subrina, MD       insulin detemir (LEVEMIR) injection 15 Units  15 Units Subcutaneous QHS Janalyn Shy, Subrina, MD   15 Units at 09/09/22 2308   loperamide (IMODIUM) capsule 2 mg  2 mg Oral Q6H PRN Janalyn Shy, Subrina, MD       methocarbamol (ROBAXIN) 500 mg in dextrose 5 % 50 mL IVPB  500 mg Intravenous Q6H PRN Janalyn Shy, Subrina, MD       metoprolol tartrate (LOPRESSOR) injection 5 mg  5 mg Intravenous Q5 min PRN Janalyn Shy, Subrina, MD       mirtazapine (REMERON SOL-TAB) disintegrating tablet 15 mg  15 mg Oral QHS Sundil, Subrina, MD   15 mg at 09/09/22 2255   multivitamin with minerals tablet 1 tablet  1 tablet Oral Daily Janalyn Shy, Subrina, MD   1 tablet at  09/10/22 0804   nitroGLYCERIN (NITROSTAT) SL tablet 0.4 mg  0.4 mg Sublingual Q5 min PRN Janalyn Shy, Subrina, MD       ondansetron [facility]) tablet 4 mg  4 mg Oral Q6H PRN Janalyn Shy, Subrina, MD       Or   ondansetron [facility]) injection 4 mg  4 mg Intravenous Q6H PRN Janalyn Shy, Subrina, MD   4 mg at 09/10/22 0803   pantoprazole (PROTONIX) EC tablet 40 mg  40 mg Oral Daily Sundil, Subrina, MD   40 mg at 09/10/22 0804   potassium chloride (KLOR-CON) packet 40 mEq  40 mEq Oral Once Sundil, Subrina, MD       potassium chloride SA (KLOR-CON M) CR tablet 40 mEq  40 mEq Oral Once Sundil, Subrina, MD       sodium chloride flush (NS) 0.9 % injection 3 mL  3 mL Intravenous Q12H Sundil, Subrina, MD   3 mL at 09/10/22 0804   sodium chloride flush (NS) 0.9 % injection 3 mL  3 mL Intravenous PRN Sundil, Subrina, MD         ROS: As per HPI otherwise negative.  Physical Exam: Vitals:   09/10/22 0649 09/10/22 0712 09/10/22 0727 09/10/22 0801  BP: 124/65 121/71 125/70 103/84  Pulse: 93 98 98 96  Resp: 13 12 (!) 23 17  Temp:   98.1 F (36.7 C) 98.1 F (36.7 C)  TempSrc:    Oral  SpO2: 100% 100% 100% 100%  Weight:   133.2 kg   Height:  General: Appears comfortable, in no distress  Head: NCAT sclera not icteric MMM Neck: Supple. No JVD appreciated  Lungs: Clear anteriorly, no iwob  Heart: RRR, no murmur, rub, or gallop  Abdomen: obese, soft, mild diffuse tenderness  Lower extremities:no significant edema;  Neuro: A & O X 3. Moves all extremities spontaneously. Psych:  Responds to questions appropriately with a normal affect. Dialysis Access: [facility] in place; L UE AVF +t/b   Labs: Basic Metabolic Panel: Recent Labs  Lab 09/09/22 1646 09/10/22 0210 09/10/22 0738  NA 139 135 136  K 2.3* 2.6* 3.0*  CL 100 95* 98  CO2 26 24 29   GLUCOSE 91 228* 101*  BUN 19 20 6   CREATININE 4.57* 5.32* 1.99*  CALCIUM 8.5* 8.6* 7.7*  PHOS 2.9  --   --    Liver Function Tests: Recent Labs  Lab 09/09/22 1646  09/10/22 0210 09/10/22 0738  AST 13* 12* 9*  ALT 7 9 10   ALKPHOS 83 79 81  BILITOT 0.5 0.6 0.7  PROT 6.9 6.7 6.5  ALBUMIN 2.7* 2.6* 2.5*   Recent Labs  Lab 09/09/22 1624  LIPASE 23   No results for input(s): "AMMONIA" in the last 168 hours. CBC: Recent Labs  Lab 09/09/22 1646 09/10/22 0210  WBC 11.5* 12.4*  NEUTROABS 8.0*  --   HGB 9.5* 8.7*  HCT 31.4* 29.0*  MCV 96.3 96.7  PLT 417* 403*   Cardiac Enzymes: No results for input(s): "CKTOTAL", "CKMB", "CKMBINDEX", "TROPONINI" in the last 168 hours. CBG: Recent Labs  Lab 09/09/22 2307 09/10/22 0813  GLUCAP 186* 99   Iron Studies:  Recent Labs    09/09/22 2140  IRON 56  TIBC 277  FERRITIN 688*   Studies/Results: DG Chest Portable 1 View  Result Date: 09/09/2022 CLINICAL DATA:  Chest pain EXAM: PORTABLE CHEST 1 VIEW COMPARISON:  07/13/2022 FINDINGS: Dual lumen right-sided central venous catheter in satisfactory position. No focal consolidation. No pleural effusion or pneumothorax. Heart and mediastinal contours are unremarkable. No acute osseous abnormality. IMPRESSION: 1. No acute cardiopulmonary disease. Electronically Signed   By: Elige Ko M.D.   On: 09/09/2022 17:17    Dialysis Orders:  [facility] MWF 4:00 400/800 EDW 126.9kg 3K/2.5Ca TDC  Heparin 5000 + 2500 intermittent Mircera 150 q 2 weeks (to start 7/5) Hectorol 6 q HD  Last OP HD 7/1 post wt 126.5kg   Assessment/Plan: Abdominal pain/nausea/vomiting - history of gastroparesis - per primary  Chest pain -  CXR negative. Improved this am. Per primary  ESRD -  HD MWF. Missed Wed. Completed makeup dialysis early this morning, so will do next dialysis Saturday.  Hypokalemia - repleting with KCL. Added K bath on dialysis  Hypertension/volume  - Hypotensive this am after dialysis. Carvedilol had been decreased to 6.25 bid. Follow Anemia  - Hgb 9.5. Will give ESA today  Metabolic bone disease -  Calcium/phos acceptable. Follow trends Nutrition - Renal diet  when eating  Afib - On Eliquis/carvedilol  T2DM - insulin  per primary team   Tomasa Blase PA-C [facility] 09/10/2022, 9:59 AM

## 2022-09-10 NOTE — Progress Notes (Signed)
Patient reported having an allergy to chlorhexidine. Sundil MD notified. No new orders or changes made to current orders at this time.

## 2022-09-10 NOTE — Progress Notes (Addendum)
Patient is alert and oriented x4. Patient would like to be discharged now. I have explained the risks of leaving without HD tomorrow and having a low potassium. She stated she will go Monday. Julian Reil MD notified. AMA form signed and located in chart.

## 2022-09-10 NOTE — Progress Notes (Signed)
Echocardiogram 2D Echocardiogram has been performed.  Deborah [name] 09/10/2022, 12:17 PM

## 2022-09-10 NOTE — Progress Notes (Signed)
PROGRESS NOTE    Deborah [name]  ZOX:[id number] DOB: 09-21-1965 DOA: 09/09/2022 PCP: Pcp, No   Brief Narrative:  This is a 57 year old female who presents to the hospital on 09/09/2022 with complaints of abdominal pain, atypical chest pain, and shortness of breath.  She has a known medical history of CKD stage IV on hemodialysis MWF schedule, insulin-dependent diabetes mellitus type 2, diabetic neuropathy, diabetic gastroparesis, hypertension, atrial fibrillation on Eliquis, chronic HFpEF, chronic hypokalemia, chronic back pain secondary to spinal stenosis, duodenal carcinoma s/p resection 2021, chronic narcotic dependence, morbid obesity and chronic ambulatory dysfunction wheelchair-bound at baseline.   Assessment & Plan:   Principal Problem:   Atrial fibrillation with RVR (HCC)-resolved Active Problems:   Chronic hypokalemia   Elevated troponin   Chronic a-fib (HCC)   Essential hypertension   Insulin dependent type 2 diabetes mellitus (HCC)   Nausea & vomiting   End stage renal disease on dialysis (HCC)   Peripheral neuropathy   (HFpEF) heart failure with preserved ejection fraction (HCC)   Chronic anemia   Hypotension    Acute atypical chest pain, ACS ruled out  Chronic atrial fibrillation with transient RVR, rate controlled -Currently rate controlled, initially heart rate of 160 improved with Cardizem -Likely exacerbated by intractable abdominal pain nausea vomiting and missed dialysis -Minimally elevated troponin likely supply/demand mismatch in the setting of RVR, EKG unremarkable, ACS unlikely -Continue Eliquis, carvedilol, telemetry   Intractable abdominal pain nausea and vomiting Questionable worsening gastroparesis, acute on chronic Unlikely pancreatitis -Patient denies sick contacts recent travel or questionable food as source -Patient does report missed dialysis, unclear if symptoms related to uremia although they did resolve with hemodialysis this morning -No  further signs or symptoms of nausea or vomiting today, continue supportive care Zofran, advance diet as tolerated to renal diet -Lipase within normal limits  Hypokalemia, profound Hypomagnesemia -Likely complicated by intractable nausea vomiting and missed dialysis -Potassium magnesium replaced overnight due to markedly low levels -Defer to nephrology for placement/dialysis moving forward to avoid profound electrolyte shifts  History of hypertension Diastolic heart failure with preserved EF 50% to 55% Hypotensive, resolved -Likely secondary to RVR, with rate control blood pressure improved -Resume home medications including scheduled carvedilol, as needed hydralazine, metoprolol   ESRD, dialysis via right anterior temporary dialysis catheter, Monday Wednesday Friday -New to dialysis, initiated 1 month ago per patient -left BC AV fistula placed 06/30/2022 not yet matured -Nephrology aware and following, dialysis this morning -plan to repeat dialysis tomorrow -Patient missed dialysis session 09/08/2022 -Patient appears mildly hypervolemic -Continue Lasix 80 daily, home medication as patient is not anuric   insulin-dependent diabetes mellitus type 2, uncontrolled with hyperglycemia Most recent A1c 8 about 12 months ago. -Patient's home regimen include long-acting insulin 15 unit daily, NovoLog 5 units 3 times daily with meals and low sliding scale SSI as needed. - Continue renal low-carb diet   Chronic pain syndrome -Per chart review at home patient takes Dilaudid 4 mg 3 times daily as needed for pain.  Unclear if active per [name] aware - will continue low-dose 2 mg every 12 hours as needed for severe, breakthrough pain only to avoid any sudden medication changes.  This medication will not be prescribed at discharge.  History of insomnia -Remeron 15 mg at bedtime as needed.   Anemia of chronic disease from ESRD - Continue home ferrous sulfate 325 mg daily. - At baseline, around 9-10  DVT  prophylaxis: SCDs Start: 09/09/22 2102 apixaban (ELIQUIS) tablet 5 mg  Code  Status:   Code Status: Full Code Family Communication: None present  Status is: Inpatient  Dispo: The patient is from: Home              Anticipated d/c is to: Home              Anticipated d/c date is: 24 to 48 hours              Patient currently not medically stable for discharge  Consultants:  Nephrology  Procedures:  Dialysis  Antimicrobials:  None indicated  Subjective: No acute issues or events overnight, denies chest pain shortness of breath headache fevers chills, nausea vomiting improving abdominal pain resolved.  Objective: Vitals:   09/10/22 0649 09/10/22 0712 09/10/22 0727 09/10/22 0801  BP: 124/65 121/71 125/70 103/84  Pulse: 93 98 98 96  Resp: 13 12 (!) 23 17  Temp:   98.1 F (36.7 C) 98.1 F (36.7 C)  TempSrc:    Oral  SpO2: 100% 100% 100% 100%  Weight:   133.2 kg   Height:        Intake/Output Summary (Last 24 hours) at 09/10/2022 0813 Last data filed at 09/10/2022 0727 Gross per 24 hour  Intake 347.72 ml  Output 700 ml  Net -352.28 ml   Filed Weights   09/09/22 1611 09/10/22 0500 09/10/22 0727  Weight: 133.8 kg 133.8 kg 133.2 kg    Examination:  General:  Pleasantly resting in bed, No acute distress. HEENT:  Normocephalic atraumatic.  Sclerae nonicteric, noninjected.  Extraocular movements intact bilaterally. Neck:  Without mass or deformity.  Trachea is midline. Lungs:  Clear to auscultate bilaterally without rhonchi, wheeze, or rales. Heart:  Regular rate and rhythm.  Without murmurs, rubs, or gallops. Abdomen:  Soft, nontender, nondistended.  Without guarding or rebound. Extremities: Without cyanosis, clubbing, left brachiocephalic fistula, appropriate thrill. Skin:  Warm and dry, no erythema.  Right anterior chest wall dialysis catheter site clean dry intact  Data Reviewed: I have personally reviewed following labs and imaging studies  CBC: Recent Labs  Lab  09/09/22 1646 09/10/22 0210  WBC 11.5* 12.4*  NEUTROABS 8.0*  --   HGB 9.5* 8.7*  HCT 31.4* 29.0*  MCV 96.3 96.7  PLT 417* 403*   Basic Metabolic Panel: Recent Labs  Lab 09/09/22 1646 09/10/22 0210  NA 139 135  K 2.3* 2.6*  CL 100 95*  CO2 26 24  GLUCOSE 91 228*  BUN 19 20  CREATININE 4.57* 5.32*  CALCIUM 8.5* 8.6*  MG 1.4* 1.8  PHOS 2.9  --    GFR: Estimated Creatinine Clearance: 16.2 mL/min (A) (by C-G formula based on SCr of 5.32 mg/dL (H)). Liver Function Tests: Recent Labs  Lab 09/09/22 1646 09/10/22 0210  AST 13* 12*  ALT 7 9  ALKPHOS 83 79  BILITOT 0.5 0.6  PROT 6.9 6.7  ALBUMIN 2.7* 2.6*   Recent Labs  Lab 09/09/22 1624  LIPASE 23   No results for input(s): "AMMONIA" in the last 168 hours. Coagulation Profile: No results for input(s): "INR", "PROTIME" in the last 168 hours. Cardiac Enzymes: No results for input(s): "CKTOTAL", "CKMB", "CKMBINDEX", "TROPONINI" in the last 168 hours. BNP (last 3 results) No results for input(s): "PROBNP" in the last 8760 hours. HbA1C: No results for input(s): "HGBA1C" in the last 72 hours. CBG: Recent Labs  Lab 09/09/22 2307  GLUCAP 186*   Lipid Profile: No results for input(s): "CHOL", "HDL", "LDLCALC", "TRIG", "CHOLHDL", "LDLDIRECT" in the last 72 hours. Thyroid  Function Tests: Recent Labs    09/09/22 2140  TSH 1.541   Anemia Panel: Recent Labs    09/09/22 2140  FERRITIN 688*  TIBC 277  IRON 56   Sepsis Labs: No results for input(s): "PROCALCITON", "LATICACIDVEN" in the last 168 hours.  No results found for this or any previous visit (from the past 240 hour(s)).       Radiology Studies: DG Chest Portable 1 View  Result Date: 09/09/2022 CLINICAL DATA:  Chest pain EXAM: PORTABLE CHEST 1 VIEW COMPARISON:  07/13/2022 FINDINGS: Dual lumen right-sided central venous catheter in satisfactory position. No focal consolidation. No pleural effusion or pneumothorax. Heart and mediastinal contours are  unremarkable. No acute osseous abnormality. IMPRESSION: 1. No acute cardiopulmonary disease. Electronically Signed   By: Elige Ko M.D.   On: 09/09/2022 17:17     Scheduled Meds:  apixaban  5 mg Oral BID   atorvastatin  10 mg Oral QHS   carvedilol  6.25 mg Oral BID WC   darbepoetin (ARANESP) injection - DIALYSIS  200 mcg Subcutaneous Q Fri-1800   doxercalciferol  6 mcg Intravenous Q M,W,F-HD   ferrous sulfate  325 mg Oral BID WC   folic acid  1 mg Oral Daily   insulin aspart  0-6 Units Subcutaneous TID WC   insulin aspart  4 Units Subcutaneous TID WC   insulin detemir  15 Units Subcutaneous QHS   mirtazapine  15 mg Oral QHS   multivitamin with minerals  1 tablet Oral Daily   pantoprazole  40 mg Oral Daily   potassium chloride  40 mEq Oral Once   potassium chloride  40 mEq Oral Once   sodium chloride flush  3 mL Intravenous Q12H   Continuous Infusions:  sodium chloride     methocarbamol (ROBAXIN) IV       LOS: 1 day   Time spent:  Azucena Fallen, DO [facility]  If 7PM-7AM, please contact night-coverage [email or web address]  09/10/2022, 8:13 AM

## 2022-09-10 NOTE — Plan of Care (Signed)

## 2022-09-11 LAB — HEPATITIS B SURFACE ANTIBODY, QUANTITATIVE: Hep B S AB Quant (Post): 426 m[IU]/mL

## 2022-09-11 NOTE — Discharge Summary (Signed)
DISCHARGE NOTE - PATIENT LEFT AMA    Deborah [name]  ZOX:[id number] DOB: Aug 05, 1965 DOA: 09/09/2022 PCP: Pcp, No   Brief Narrative:  This is a 57 year old female who presents to the hospital on 09/09/2022 with complaints of abdominal pain, atypical chest pain, and shortness of breath.  She has a known medical history of CKD stage IV on hemodialysis MWF schedule, insulin-dependent diabetes mellitus type 2, diabetic neuropathy, diabetic gastroparesis, hypertension, atrial fibrillation on Eliquis, chronic HFpEF, chronic hypokalemia, chronic back pain secondary to spinal stenosis, duodenal carcinoma s/p resection 2021, chronic narcotic dependence, morbid obesity and chronic ambulatory dysfunction wheelchair-bound at baseline.  PATIENT LEFT AMA OVERNIGHT DESPITE EDUCATION THAT LEAVING PRIOR TO COMPLETING ADDITIONAL DIALYSIS COULD RESULT IN WORSENING CONDITION INCLUDING, BUT NOT LIMITED TO, DEATH.  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 Patient became irate - explaining that she could not take PO meds due to nausea/vomiting while having just finished eating a large meal without incident/issue. Further explanation about the indications for IV narcotics vs PO. Patient refused PO dilaudid only further confirming that narcotics were likely not indicated for her reported 'intractable abdominal pain'  Acute atypical chest pain, ACS ruled out  Chronic atrial fibrillation  with transient RVR, rate controlled -Currently rate controlled, initially heart rate of 160 improved with Cardizem -Likely exacerbated by intractable abdominal pain nausea vomiting and missed dialysis -Minimally elevated troponin likely supply/demand mismatch in the setting of RVR, EKG unremarkable, ACS unlikely -Continue Eliquis, carvedilol   Intractable abdominal pain nausea and vomiting Questionable worsening gastroparesis, acute on chronic Unlikely pancreatitis -resolved - tolerating PO prior to discharge without incident. Refusing PO medications due to 'symptoms' despite eating a large meal without complications.  Hypokalemia, profound Hypomagnesemia -Night team discussed the high risk of leaving AMA with abnormal electrolytes  History of hypertension Diastolic heart failure with preserved EF 50% to 55% Hypotensive, resolved -Likely secondary to RVR, with rate control blood pressure improved -Resume home medications   ESRD, dialysis via right anterior temporary dialysis catheter, Monday Wednesday Friday -New to dialysis, initiated 1 month ago per patient -left BC AV fistula placed 06/30/2022 not yet matured -Nephrology aware and following, dialysis this morning -plan to repeat dialysis tomorrow -Patient missed dialysis session 09/08/2022 Plan for repeat dialysis 7/6 interrupted as patient left AMA  insulin-dependent diabetes mellitus type 2, uncontrolled with hyperglycemia A1C 8.1 - educated on risks of ongoing dietary/lifestyle noncompliance   Chronic pain syndrome -Per chart review at home patient reports taking Dilaudid 4 mg 3 times daily as needed for pain.  Unclear if active Rx per [name] aware - plan to continue low-dose po dilaudid 2 mg every 12 hours as needed for severe, breakthrough pain only to avoid any sudden medication changes in case patient is truly on PO dilaudid at home. Unfortunately this medication was refused multiple times as IV narcotics were inappropriately  requested.  This medication will not be prescribed at discharge.  History of insomnia -  Remeron 15 mg at bedtime as needed.   Anemia of chronic disease from ESRD - Continue home ferrous sulfate 325 mg daily. - At baseline, around 9-10  DVT prophylaxis:   Code Status:   Code Status: Prior Family Communication: None present  Status is: Inpatient  Dispo: LEFT AMA  Consultants:  Nephrology  Procedures:  Dialysis planned 7/5 and 7/6  Antimicrobials:  None indicated  Subjective: Left ama overnight  Objective: Vitals:   09/10/22 0727 09/10/22 0801 09/10/22 1418 09/10/22 1941  BP: 125/70 103/84 115/76 99/80  Pulse: 98 96 89 89  Resp: (!) 23 17 14 12   Temp: 98.1 F (36.7 C) 98.1 F (36.7 C) 97.6 F (36.4 C) (!) 97.4 F (36.3 C)  TempSrc:  Oral Oral Oral  SpO2: 100% 100% 100% 100%  Weight: 133.2 kg     Height:        Intake/Output Summary (Last 24 hours) at 09/11/2022 0937 Last data filed at 09/10/2022 1700 Gross per 24 hour  Intake 333.6 ml  Output --  Net 333.6 ml    Filed Weights   09/09/22 1611 09/10/22 0500 09/10/22 0727  Weight: 133.8 kg 133.8 kg 133.2 kg   Data Reviewed: I have personally reviewed following labs and imaging studies  CBC: Recent Labs  Lab 09/09/22 1646 09/10/22 0210  WBC 11.5* 12.4*  NEUTROABS 8.0*  --   HGB 9.5* 8.7*  HCT 31.4* 29.0*  MCV 96.3 96.7  PLT 417* 403*    Basic Metabolic Panel: Recent Labs  Lab 09/09/22 1646 09/10/22 0210 09/10/22 0738  NA 139 135 136  K 2.3* 2.6* 3.0*  CL 100 95* 98  CO2 26 24 29   GLUCOSE 91 228* 101*  BUN 19 20 6   CREATININE 4.57* 5.32* 1.99*  CALCIUM 8.5* 8.6* 7.7*  MG 1.4* 1.8  --   PHOS 2.9  --   --     GFR: Estimated Creatinine Clearance: 43.2 mL/min (A) (by C-G formula based on SCr of 1.99 mg/dL (H)). Liver Function Tests: Recent Labs  Lab 09/09/22 1646 09/10/22 0210 09/10/22 0738  AST 13* 12* 9*  ALT 7 9 10   ALKPHOS 83 79 81  BILITOT 0.5 0.6 0.7  PROT 6.9 6.7 6.5   ALBUMIN 2.7* 2.6* 2.5*    Recent Labs  Lab 09/09/22 1624  LIPASE 23     Recent Labs  Lab 09/09/22 2307 09/10/22 0813 09/10/22 1121 09/10/22 1610 09/10/22 1935  GLUCAP 186* 99 169* 231* 261*    Lipid Profile: No results for input(s): "CHOL", "HDL", "LDLCALC", "TRIG", "CHOLHDL", "LDLDIRECT" in the last 72 hours. Thyroid Function Tests: Recent Labs    09/09/22 2140  TSH 1.541    Anemia Panel: Recent Labs    09/09/22 2140  FERRITIN 688*  TIBC 277  IRON 56    Sepsis Labs: No results for input(s): "PROCALCITON", "LATICACIDVEN" in the last 168 hours.  No results found for this or any previous visit (from the past 240 hour(s)).       Radiology Studies: ECHOCARDIOGRAM LIMITED  Result Date: 09/10/2022    ECHOCARDIOGRAM LIMITED REPORT   Patient Name:   Deborah [name] Date of Exam: 09/10/2022 Medical Rec #:  [id number]            Height:       66.0 in Accession #:    [id number]           Weight:       293.7 lb Date of Birth:  1965-04-24  BSA:          2.354 m Patient Age:    57 years             BP:           95/59 mmHg Patient Gender: F                    HR:           80 bpm. Exam Location:  Inpatient Procedure: 2D Echo, Cardiac Doppler and Color Doppler Indications:    Elevated Troponin  History:        Patient has prior history of Echocardiogram examinations, most                 recent 09/11/2021. Signs/Symptoms:Chest Pain; Risk                 Factors:Hypertension, Dyslipidemia and Chronic Kidney Disease.  Sonographer:    Raeford Razor Referring Phys: [id number] SUBRINA SUNDIL  Sonographer Comments: Image acquisition challenging due to patient body habitus. IMPRESSIONS  1. Left ventricular ejection fraction, by estimation, is 55 to 60%. The left ventricle has normal function. Left ventricular diastolic parameters are consistent with Grade I diastolic dysfunction (impaired relaxation).  2. Right ventricular systolic function is normal. The right ventricular size is  normal.  3. The mitral valve is normal in structure. No evidence of mitral valve regurgitation.  4. The aortic valve is tricuspid.  5. The inferior vena cava is normal in size with greater than 50% respiratory variability, suggesting right atrial pressure of 3 mmHg. FINDINGS  Left Ventricle: Left ventricular ejection fraction, by estimation, is 55 to 60%. The left ventricle has normal function. Left ventricular diastolic parameters are consistent with Grade I diastolic dysfunction (impaired relaxation). Right Ventricle: The right ventricular size is normal. No increase in right ventricular wall thickness. Right ventricular systolic function is normal. Left Atrium: Left atrial size was normal in size. Right Atrium: Right atrial size was normal in size. Pericardium: There is no evidence of pericardial effusion. Mitral Valve: The mitral valve is normal in structure. Tricuspid Valve: The tricuspid valve is normal in structure. Tricuspid valve regurgitation is trivial. Aortic Valve: The aortic valve is tricuspid. Aortic valve peak gradient measures 6.8 mmHg. Pulmonic Valve: The pulmonic valve was normal in structure. Aorta: The aortic root is normal in size and structure. Venous: The inferior vena cava is normal in size with greater than 50% respiratory variability, suggesting right atrial pressure of 3 mmHg. LEFT VENTRICLE PLAX 2D LVIDd:         5.80 cm      Diastology LVIDs:         3.00 cm      LV e' medial:   8.55 cm/s LV PW:         0.90 cm      LV E/e' medial: 11.1 LV IVS:        0.90 cm LVOT diam:     2.20 cm LVOT Area:     3.80 cm  LV Volumes (MOD) LV vol d, MOD A2C: 128.0 ml LV vol d, MOD A4C: 132.0 ml LV vol s, MOD A2C: 62.1 ml LV vol s, MOD A4C: 64.3 ml LV SV MOD A2C:     65.9 ml LV SV MOD A4C:     132.0 ml LV SV MOD BP:      68.9 ml RIGHT VENTRICLE             IVC  RV Basal diam:  2.60 cm     IVC diam: 1.50 cm RV S prime:     13.33 cm/s TAPSE (M-mode): 2.4 cm LEFT ATRIUM           Index        RIGHT ATRIUM            Index LA diam:      4.20 cm 1.78 cm/m   RA Area:     10.40 cm LA Vol (A4C): 76.5 ml 32.49 ml/m  RA Volume:   16.50 ml  7.01 ml/m  AORTIC VALVE AV Area (Vmax): 2.59 cm AV Vmax:        130.50 cm/s AV Peak Grad:   6.8 mmHg LVOT Vmax:      89.00 cm/s  AORTA Ao Root diam: 2.90 cm MITRAL VALVE MV Area (PHT): 3.85 cm     SHUNTS MV Decel Time: 197 msec     Systemic Diam: 2.20 cm MV E velocity: 94.50 cm/s MV A velocity: 109.00 cm/s MV E/A ratio:  0.87 Donato Schultz MD Electronically signed by Donato Schultz MD Signature Date/Time: 09/10/2022/1:10:00 PM    Final    DG Chest Portable 1 View  Result Date: 09/09/2022 CLINICAL DATA:  Chest pain EXAM: PORTABLE CHEST 1 VIEW COMPARISON:  07/13/2022 FINDINGS: Dual lumen right-sided central venous catheter in satisfactory position. No focal consolidation. No pleural effusion or pneumothorax. Heart and mediastinal contours are unremarkable. No acute osseous abnormality. IMPRESSION: 1. No acute cardiopulmonary disease. Electronically Signed   By: Elige Ko M.D.   On: 09/09/2022 17:17     Scheduled Meds:   Continuous Infusions:     LOS: 1 day   Time spent:  Azucena Fallen, DO [facility]  If 7PM-7AM, please contact night-coverage [email or web address]  09/11/2022, 9:37 AM

## 2022-09-11 NOTE — Plan of Care (Signed)
[facility] Patient Discharge Orders- [facility] CLINIC: [facility]  Patient's name: MARGREE LACKMAN Admit/DC Dates: 09/09/2022 - 09/10/2022  Left AMA   Discharge Diagnoses: Abd pain/nausea/vomiting.  Atypical chest pain - resolved. ACS r/o  Hypokalemia - s/p  KCL    Aranesp: Given: Yes  Date and amount of last dose: 7/5 200 mcg     Last Hgb: 8.7 PRBC's Given: -- Date/# of units: -- ESA dose for discharge: Mircera 150 mcg IV q 2 weeks  IV Iron dose at discharge: --  Heparin change: --  EDW Change: No change   Bath Change: --  Access intervention/Change: -- Details:  Hectorol/Calcitriol change: --  Discharge Labs: Calcium 8.6 Phosphorus 2.9 Albumin 2.5 K+ 3.0  IV Antibiotics: -- Details:  On Coumadin?: -- Last INR: Next INR: Managed By:   OTHER/APPTS/LAB ORDERS: -Please recheck potassium with next HD. Needs weekly potassium lab   D/C Meds to be reconciled by nurse after every discharge.  Completed By: Tomasa Blase PA-C 09/11/2022,3:28 PM   Reviewed by: MD:______ RN_______

## 2022-09-13 NOTE — Progress Notes (Signed)
Late Note Entry  Pt left hospital AMA on Friday. Contacted [facility] this morning to advise clinic of pt's leaving AMA on Friday and that pt should resume care today.   Olivia Canter Renal Navigator [phone]

## 2022-09-14 ENCOUNTER — Encounter: Payer: Self-pay | Admitting: Registered Nurse

## 2022-09-14 ENCOUNTER — Encounter: Payer: 59 | Attending: Registered Nurse | Admitting: Registered Nurse

## 2022-09-14 VITALS — BP 93/52 | HR 89 | Ht 66.0 in

## 2022-09-14 DIAGNOSIS — G8929 Other chronic pain: Secondary | ICD-10-CM | POA: Diagnosis present

## 2022-09-14 DIAGNOSIS — Z79891 Long term (current) use of opiate analgesic: Secondary | ICD-10-CM | POA: Diagnosis present

## 2022-09-14 DIAGNOSIS — Z5181 Encounter for therapeutic drug level monitoring: Secondary | ICD-10-CM | POA: Insufficient documentation

## 2022-09-14 DIAGNOSIS — M545 Low back pain, unspecified: Secondary | ICD-10-CM | POA: Diagnosis present

## 2022-09-14 DIAGNOSIS — M25511 Pain in right shoulder: Secondary | ICD-10-CM | POA: Diagnosis present

## 2022-09-14 DIAGNOSIS — G894 Chronic pain syndrome: Secondary | ICD-10-CM | POA: Diagnosis present

## 2022-09-14 DIAGNOSIS — M25512 Pain in left shoulder: Secondary | ICD-10-CM | POA: Diagnosis present

## 2022-09-14 DIAGNOSIS — G893 Neoplasm related pain (acute) (chronic): Secondary | ICD-10-CM | POA: Insufficient documentation

## 2022-09-14 MED ORDER — HYDROMORPHONE HCL 4 MG PO TABS: 4.0000 mg | ORAL_TABLET | Freq: Three times a day (TID) | ORAL | 0 refills | Status: DC | PRN

## 2022-09-14 NOTE — Progress Notes (Signed)
Subjective:    Patient ID: Deborah [name], female    DOB: [birth year], 57 y.o.   MRN: [id number]  HPI: Deborah [name] is a 57 y.o. female who returns for follow up appointment for chronic pain and medication refill. She states  her pain is located in her bilateral shoulders, abdomen, lower back and bilateral shoulders. She rates her pain 10.[name] Her current exercise regime is performing stretching exercises.  Right subclavian dressing intact ( Dialysis Catheter). Left AVF + Bruit and Thrill: She is receiving Hemodialysis on Monday/ Wednesday and Fridays  Morphine equivalent is 53.33 MME.   Last oral swab was performed on 07/20/2022, it was consistent.    She was seen to [facility] ED for abdominal Pain on 09/09/2022, note was reviewed. She left AMA, we discussed the above.   Pain Inventory Average Pain 10 Pain Right Now 10 My pain is constant, burning, and stabbing  In the last 24 hours, has pain interfered with the following? General activity 10 Relation with others 10 Enjoyment of life 10 What TIME of day is your pain at its worst? morning , daytime, evening, and night Sleep (in general) Poor  Pain is worse with: walking, bending, sitting, inactivity, standing, unsure, and some activites Pain improves with: rest, heat/ice, therapy/exercise, pacing activities, medication, and injections Relief from Meds: 6  Family History  Problem Relation Age of Onset   Diabetes Mother    Diabetes Father    Heart disease Father    Diabetes Sister    Congestive Heart Failure Sister 42   Diabetes Brother    Social History   Socioeconomic History   Marital status: Married    Spouse name: Not on file   Number of children: Not on file   Years of education: Not on file   Highest education level: Not on file  Occupational History   Not on file  Tobacco Use   Smoking status: Never   Smokeless tobacco: Never  Vaping Use   Vaping Use: Never used  Substance and Sexual Activity    Alcohol use: No   Drug use: No   Sexual activity: Not Currently    Birth control/protection: None  Other Topics Concern   Not on file  Social History Narrative   ** Merged History Encounter **       Social Determinants of Health   Financial Resource Strain: Not on file  Food Insecurity: No Food Insecurity (09/10/2022)   Hunger Vital Sign    Worried About Running Out of Food in the Last Year: Never true    Ran Out of Food in the Last Year: Never true  Transportation Needs: No Transportation Needs (09/10/2022)   PRAPARE - Administrator, Civil Service (Medical): No    Lack of Transportation (Non-Medical): No  Recent Concern: Transportation Needs - Unmet Transportation Needs (06/19/2022)   PRAPARE - Administrator, Civil Service (Medical): Yes    Lack of Transportation (Non-Medical): Yes  Physical Activity: Not on file  Stress: Not on file  Social Connections: Not on file   Past Surgical History:  Procedure Laterality Date   AV FISTULA PLACEMENT Left 06/30/2022   Procedure: LEFT BRACHIOCEPHALIC ARTERIOVENOUS (AV) FISTULA CREATION;  Surgeon: Cephus Shelling, MD;  Location: [facility] OR;  Service: Vascular;  Laterality: Left;   BIOPSY  07/27/2019   Procedure: BIOPSY;  Surgeon: Vida Rigger, MD;  Location: [facility];  Service: Endoscopy;;   BIOPSY  07/30/2019   Procedure: BIOPSY;  Surgeon: Kathi Der, MD;  Location: [facility];  Service: Gastroenterology;;   CATARACT EXTRACTION  01/2014   CHOLECYSTECTOMY     COLONOSCOPY WITH PROPOFOL N/A 07/30/2019   Procedure: COLONOSCOPY WITH PROPOFOL;  Surgeon: Kathi Der, MD;  Location: [facility];  Service: Gastroenterology;  Laterality: N/A;   ESOPHAGOGASTRODUODENOSCOPY N/A 07/27/2019   Procedure: ESOPHAGOGASTRODUODENOSCOPY (EGD);  Surgeon: Vida Rigger, MD;  Location: [facility];  Service: Endoscopy;  Laterality: N/A;   ESOPHAGOGASTRODUODENOSCOPY N/A 07/26/2020   Procedure: ESOPHAGOGASTRODUODENOSCOPY (EGD);   Surgeon: Willis Modena, MD;  Location: [facility];  Service: Endoscopy;  Laterality: N/A;   ESOPHAGOGASTRODUODENOSCOPY (EGD) WITH PROPOFOL N/A 08/02/2019   Procedure: ESOPHAGOGASTRODUODENOSCOPY (EGD) WITH PROPOFOL;  Surgeon: Kathi Der, MD;  Location: [facility];  Service: Gastroenterology;  Laterality: N/A;   HEMOSTASIS CLIP PLACEMENT  08/02/2019   Procedure: HEMOSTASIS CLIP PLACEMENT;  Surgeon: Kathi Der, MD;  Location: [facility];  Service: Gastroenterology;;   IR FLUORO GUIDE CV LINE RIGHT  06/24/2022   IR US GUIDE VASC ACCESS RIGHT  06/24/2022   POLYPECTOMY  07/30/2019   Procedure: POLYPECTOMY;  Surgeon: Kathi Der, MD;  Location: [facility];  Service: Gastroenterology;;   POLYPECTOMY  08/02/2019   Procedure: POLYPECTOMY;  Surgeon: Kathi Der, MD;  Location: [facility];  Service: Gastroenterology;;   Past Surgical History:  Procedure Laterality Date   AV FISTULA PLACEMENT Left 06/30/2022   Procedure: LEFT BRACHIOCEPHALIC ARTERIOVENOUS (AV) FISTULA CREATION;  Surgeon: Cephus Shelling, MD;  Location: [facility] OR;  Service: Vascular;  Laterality: Left;   BIOPSY  07/27/2019   Procedure: BIOPSY;  Surgeon: Vida Rigger, MD;  Location: [facility];  Service: Endoscopy;;   BIOPSY  07/30/2019   Procedure: BIOPSY;  Surgeon: Kathi Der, MD;  Location: [facility];  Service: Gastroenterology;;   CATARACT EXTRACTION  01/2014   CHOLECYSTECTOMY     COLONOSCOPY WITH PROPOFOL N/A 07/30/2019   Procedure: COLONOSCOPY WITH PROPOFOL;  Surgeon: Kathi Der, MD;  Location: [facility];  Service: Gastroenterology;  Laterality: N/A;   ESOPHAGOGASTRODUODENOSCOPY N/A 07/27/2019   Procedure: ESOPHAGOGASTRODUODENOSCOPY (EGD);  Surgeon: Vida Rigger, MD;  Location: [facility];  Service: Endoscopy;  Laterality: N/A;   ESOPHAGOGASTRODUODENOSCOPY N/A 07/26/2020   Procedure: ESOPHAGOGASTRODUODENOSCOPY (EGD);  Surgeon: Willis Modena, MD;  Location: [facility];  Service:  Endoscopy;  Laterality: N/A;   ESOPHAGOGASTRODUODENOSCOPY (EGD) WITH PROPOFOL N/A 08/02/2019   Procedure: ESOPHAGOGASTRODUODENOSCOPY (EGD) WITH PROPOFOL;  Surgeon: Kathi Der, MD;  Location: [facility];  Service: Gastroenterology;  Laterality: N/A;   HEMOSTASIS CLIP PLACEMENT  08/02/2019   Procedure: HEMOSTASIS CLIP PLACEMENT;  Surgeon: Kathi Der, MD;  Location: [facility];  Service: Gastroenterology;;   IR FLUORO GUIDE CV LINE RIGHT  06/24/2022   IR US GUIDE VASC ACCESS RIGHT  06/24/2022   POLYPECTOMY  07/30/2019   Procedure: POLYPECTOMY;  Surgeon: Kathi Der, MD;  Location: [facility];  Service: Gastroenterology;;   POLYPECTOMY  08/02/2019   Procedure: POLYPECTOMY;  Surgeon: Kathi Der, MD;  Location: [facility];  Service: Gastroenterology;;   Past Medical History:  Diagnosis Date   Acute back pain with sciatica, left    Acute back pain with sciatica, right    AKI (acute kidney injury) (HCC)    Anemia, unspecified    Cancer (HCC)    Carcinoid tumor of duodenum    Chest pain with normal coronary angiography 2019   Chronic kidney disease, stage 3b (HCC)    Chronic pain    Chronic systolic CHF (congestive heart failure) (HCC)    Diabetes mellitus    DKA (  diabetic ketoacidosis) (HCC)    Drug-seeking behavior    21 hospitalizations and 14 CT a/p in 2 years for N/V and abdominal pain, demanding only IV dilaudid   Elevated troponin    chronic   Esophageal reflux    Fibromyalgia    Gastric ulcer    Gastroparesis    Gout    Hyperlipidemia    Hypertension    Hypokalemia    Hypomagnesemia    Lumbosacral stenosis    LVH (left ventricular hypertrophy)    Morbid obesity (HCC)    NICM (nonischemic cardiomyopathy) (HCC)    PAF (paroxysmal atrial fibrillation) (HCC)    Stroke (HCC) 02/2011   Thrombocytosis    Vitamin B12 deficiency anemia    BP (!) 93/52   Pulse 89   Ht 5\' 6"  (1.676 m)   LMP 10/10/2012   SpO2 98%   BMI 47.40 kg/m   Opioid Risk  Score:   Fall Risk Score:  `1  Depression screen PHQ 2/9     09/14/2022    1:23 PM 07/20/2022    1:05 PM 09/16/2021    9:16 AM 06/01/2021    1:55 PM 08/01/2020    2:56 PM 06/05/2020    3:01 PM 01/04/2020    2:20 PM  Depression screen PHQ 2/9  Decreased Interest 1 3 3 1 1 1 2   Down, Depressed, Hopeless 1 2 3 1 1 1 3   PHQ - 2 Score 2 5 6 2 2 2 5   Altered sleeping       3  Tired, decreased energy       3  Change in appetite       3  Feeling bad or failure about yourself        3  Trouble concentrating       3  Moving slowly or fidgety/restless       3  Suicidal thoughts       0  PHQ-9 Score       23    Review of Systems  Musculoskeletal:  Positive for arthralgias, back pain, gait problem and neck pain.       Pain all over the body  All other systems reviewed and are negative.     Objective:   Physical Exam Vitals and nursing note reviewed.  Constitutional:      Appearance: Normal appearance.  Cardiovascular:     Rate and Rhythm: Normal rate and regular rhythm.     Pulses: Normal pulses.     Heart sounds: Normal heart sounds.  Pulmonary:     Effort: Pulmonary effort is normal.     Breath sounds: Normal breath sounds.  Musculoskeletal:     Cervical back: Normal range of motion and neck supple.     Comments: Normal Muscle Bulk and Muscle Testing Reveals:  Upper Extremities: Decreased ROM  Right: 90 degrees and Left Upper extremity 30 Degrees and Muscle Strength  5/5 Bilateral AVC joint Tenderness Lumbar Paraspinal Tenderness: L-3-L-5 Lower Extremities: Decreased ROM and Muscle Strength 5/5 Arrived in wheelchair      Skin:    General: Skin is warm and dry.  Neurological:     Mental Status: She is alert and oriented to person, place, and time.  Psychiatric:        Mood and Affect: Mood normal.        Behavior: Behavior normal.         Assessment & Plan:  1. Malignant Carcinoid Tumor of Abdomen/ Abdominal Pain: Continue  current medication regimen. 09/14/2022 2.  Chronic Pain Syndrome: Refilled: Dilaudid 4mg  one tablet every 8 hours as needed for pain #80. Second script sent for the following month. We will continue the opioid monitoring program, this consists of regular clinic visits, examinations, urine drug screen, pill counts as well as use of [place] Controlled Substance Reporting system. A 12 month History has been reviewed on the [place] Controlled Substance Reporting System Today.   Continue to Monitor.  3. Chronic Bilateral Lower Back Pain without sciatica: Continue HEP as Tolerated. Continue to Monitor. 07/20/2022 4. Chronic Bilateral Shoulder Pain: Continue HEP as Tolerated. Continue to Monitor.   F/U in 2 months

## 2022-09-20 ENCOUNTER — Telehealth: Payer: Self-pay | Admitting: *Deleted

## 2022-09-20 NOTE — Telephone Encounter (Signed)
Diladid is on back order Pharmacy only able to fill for  #60 which means patient will need new rx sooner.

## 2022-09-27 ENCOUNTER — Telehealth: Payer: Self-pay

## 2022-09-27 NOTE — Telephone Encounter (Signed)
Patient called stating [facility] was short #12 of Dilaudid because all they had was #68 and she took it. She picked up on 09/23/22

## 2022-09-27 NOTE — Telephone Encounter (Signed)
Call placed to [name] regarding her Dilaudid, no answer. Left message to return the call. She should call office when she has about 5 days of medication.

## 2022-09-28 NOTE — Telephone Encounter (Signed)
Patient aware.

## 2022-10-04 ENCOUNTER — Emergency Department (HOSPITAL_COMMUNITY)
Admission: EM | Admit: 2022-10-04 | Discharge: 2022-10-04 | Discharge status: Against Medical Advice | Payer: 59 | Attending: Emergency Medicine | Admitting: Emergency Medicine

## 2022-10-04 DIAGNOSIS — Z5321 Procedure and treatment not carried out due to patient leaving prior to being seen by health care provider: Secondary | ICD-10-CM | POA: Insufficient documentation

## 2022-10-04 DIAGNOSIS — R45851 Suicidal ideations: Secondary | ICD-10-CM | POA: Diagnosis not present

## 2022-10-04 DIAGNOSIS — R4689 Other symptoms and signs involving appearance and behavior: Secondary | ICD-10-CM | POA: Insufficient documentation

## 2022-10-04 NOTE — ED Notes (Signed)
Pt said she does not want to wait to be seen and left the facility. Pt moved OTF.

## 2022-10-04 NOTE — ED Notes (Signed)
Pt left AMA escorted by family due to triage wait times.

## 2022-10-07 IMAGING — CT CT ABD-PELV W/O CM
2 of 4 series · 17 of 46 positions shown, 19 images · non-contrast
Comparison: 07/22/2020.

CLINICAL DATA: [name]. Clinical suspicion for bowel
obstruction.

EXAM:
CT ABDOMEN AND PELVIS WITHOUT CONTRAST
TECHNIQUE: Multidetector CT imaging of the abdomen and pelvis was performed
following the standard protocol without IV contrast.

[Series 2: axial st · axial · 0.85mm/px · z∈[+1274,+1674]mm · 14 of 92 slices shown, 16 images]
[im 6/92  soft-tissue]
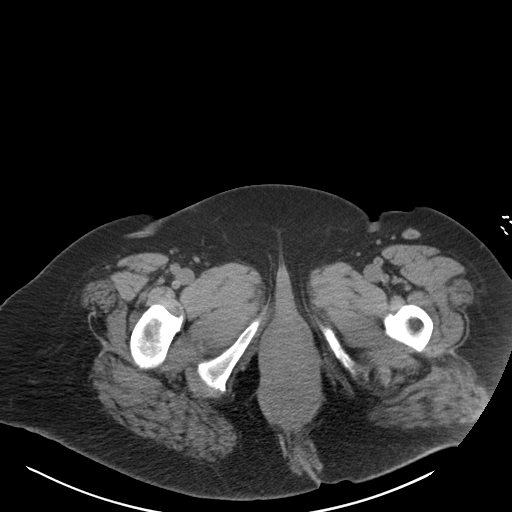
[im 6/92  bone]
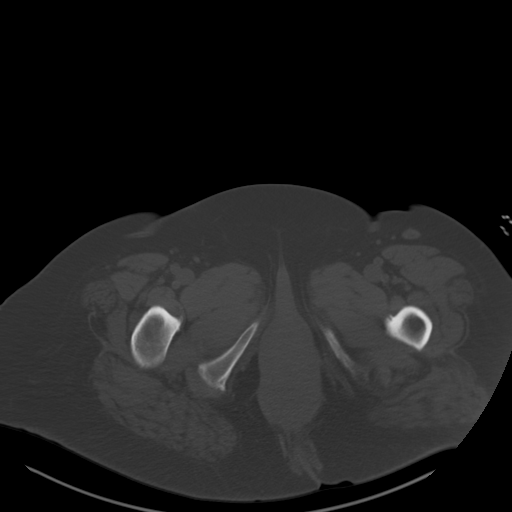
[im 11/92  soft-tissue]
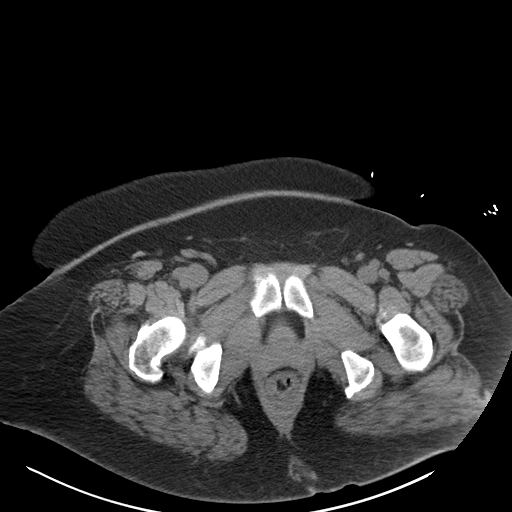
[im 21/92  soft-tissue]
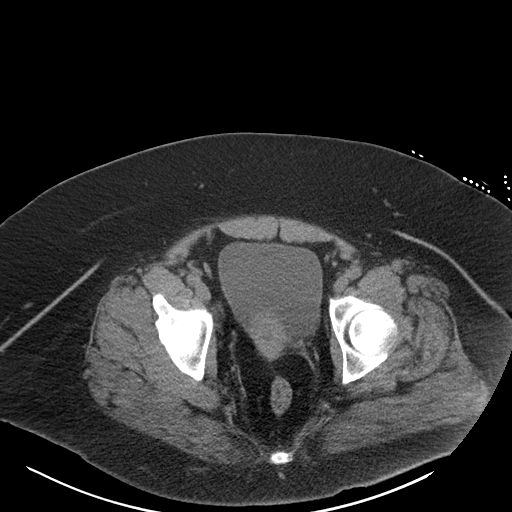
[im 26/92  soft-tissue]
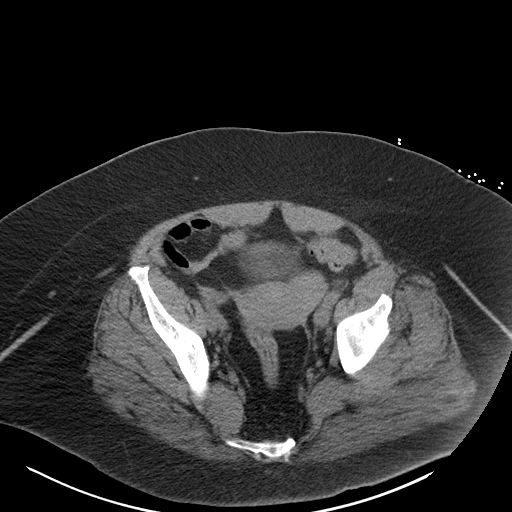
[im 31/92  soft-tissue]
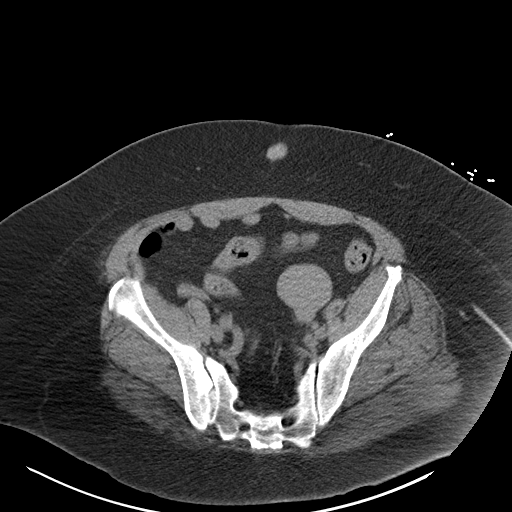
[im 36/92  soft-tissue]
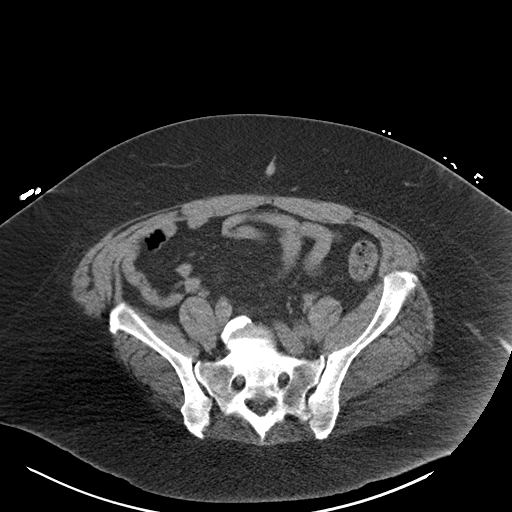
[im 41/92  soft-tissue]
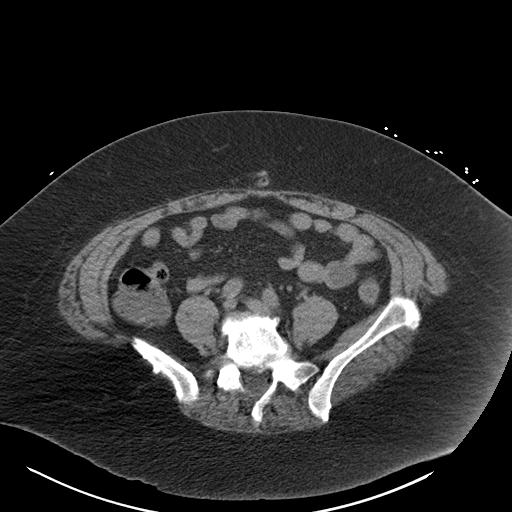
[im 51/92  soft-tissue]
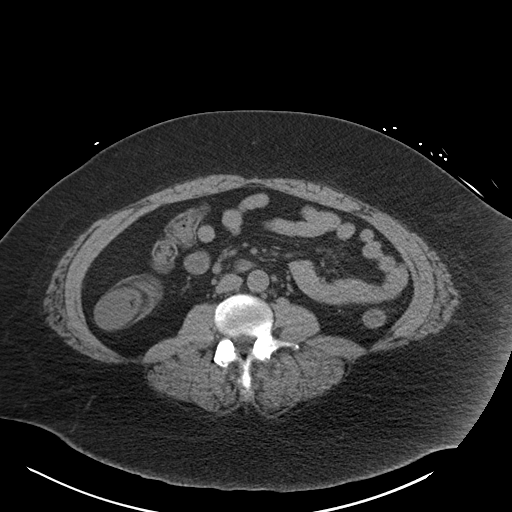
[im 56/92  soft-tissue]
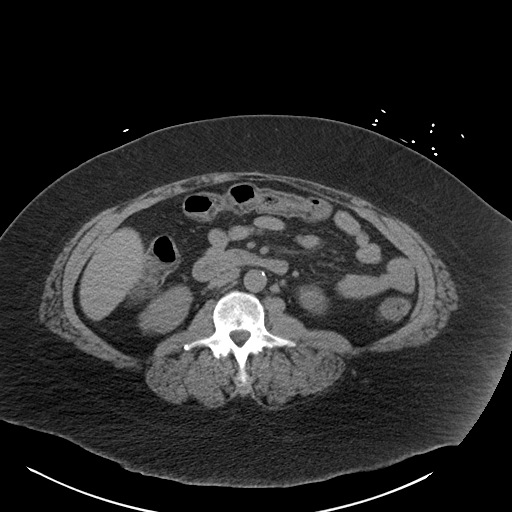
[im 56/92  bone]
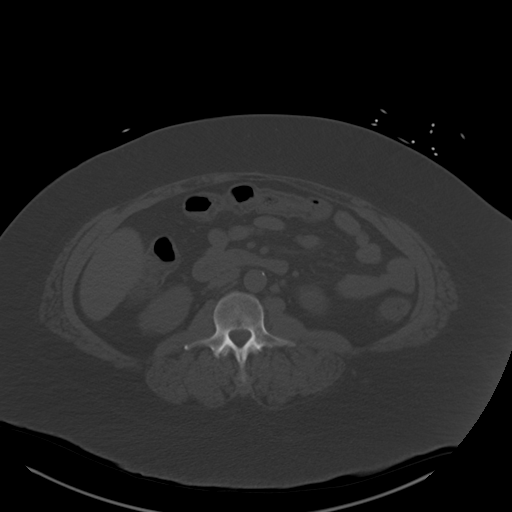
[im 61/92  soft-tissue]
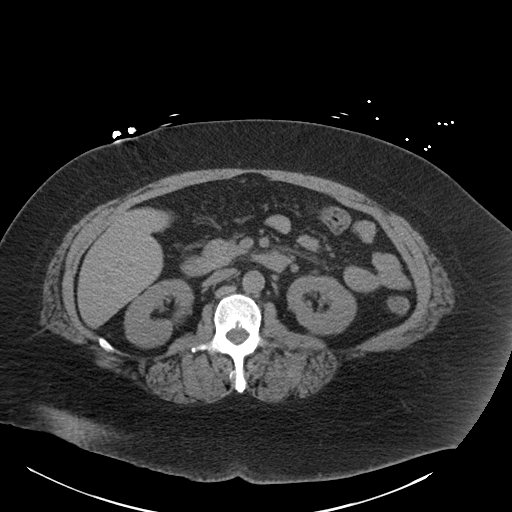
[im 66/92  soft-tissue]
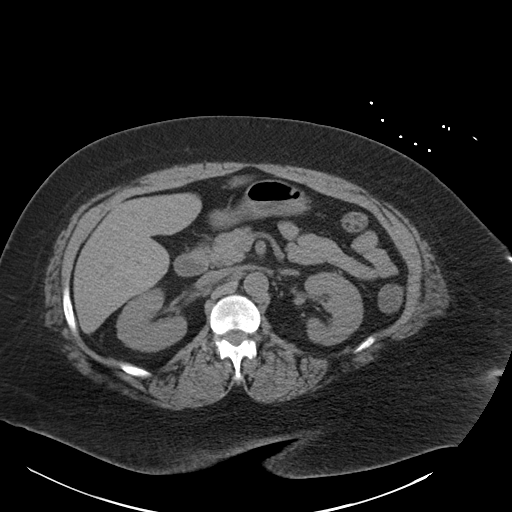
[im 71/92  soft-tissue]
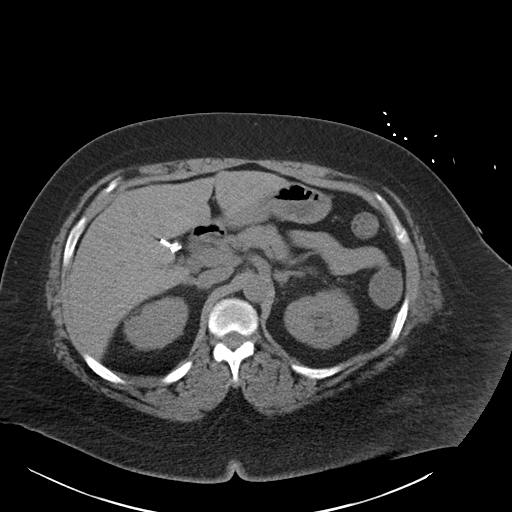
[im 81/92  soft-tissue]
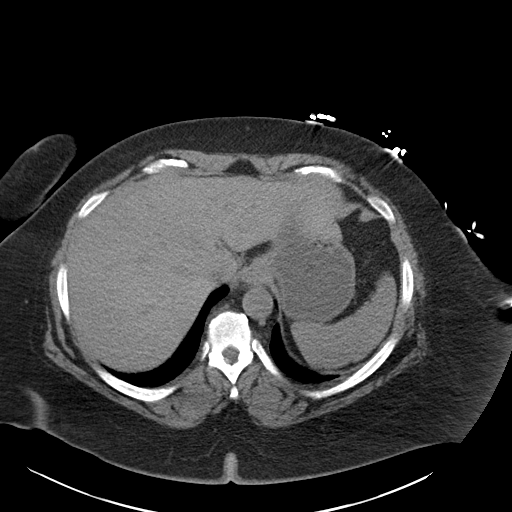
[im 86/92  soft-tissue]
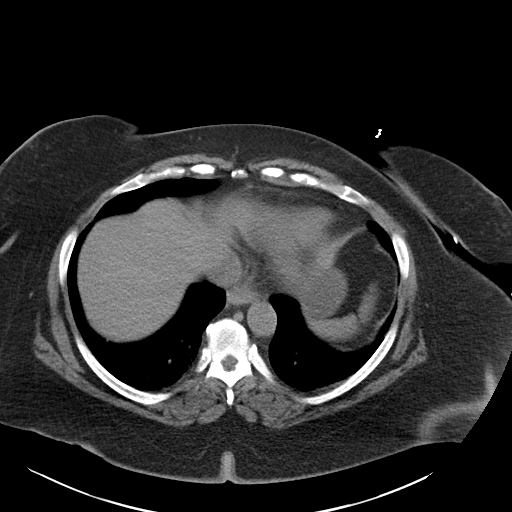

[Series 5: coronal st · coronal · 1.02mm/px · 3 of 144 slices shown]
[im 48/144  soft-tissue]
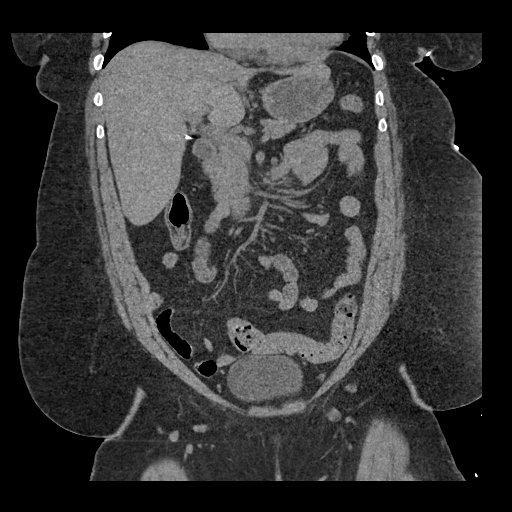
[im 64/144  soft-tissue]
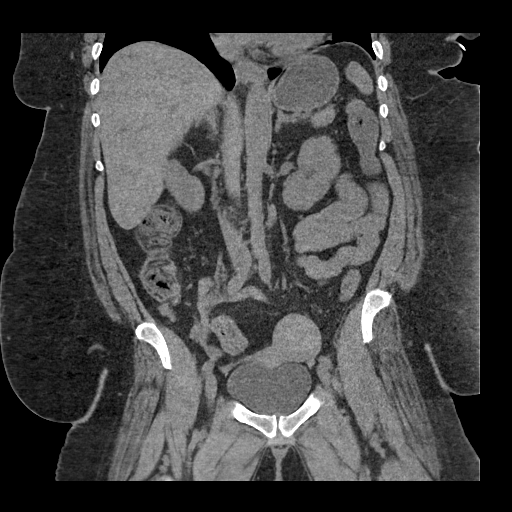
[im 80/144  soft-tissue]
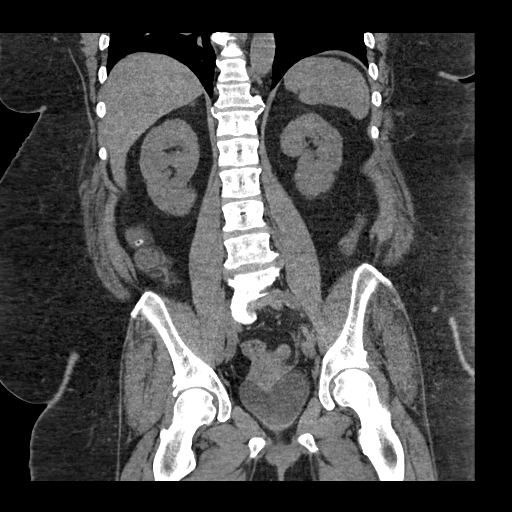

[17 of 46 positions shown; findings below may reference images not displayed]

FINDINGS: Lower chest: Linear opacities in the dependent lower lobes
consistent with subsegmental atelectasis.

Hepatobiliary: No focal liver abnormality is seen. Status post
cholecystectomy. No biliary dilatation.

Pancreas: Unremarkable. No pancreatic ductal dilatation or
surrounding inflammatory changes.

Spleen: Normal in size without focal abnormality.

Adrenals/Urinary Tract: Adrenal glands are unremarkable. Kidneys are
normal, without renal calculi, focal lesion, or hydronephrosis.
Bladder is unremarkable.

Stomach/Bowel: Normal stomach. Small bowel and colon are normal in
caliber. No wall thickening. No inflammation. Normal appendix.

Vascular/Lymphatic: No significant vascular findings are present. No
enlarged abdominal or pelvic lymph nodes.

Reproductive: Stable, 4.7 cm pedunculated uterine fibroid projecting
from the left uterine fundus. No ovarian/adnexal masses.

Other: No abdominal wall hernia or abnormality. No abdominopelvic
ascites.

Musculoskeletal: No fracture or acute finding.  No bone lesion.
IMPRESSION: 1. No acute findings.  No bowel obstruction or inflammation.

## 2022-10-07 IMAGING — CR DG CHEST 2V
2 series · 2 of 2 positions shown · non-contrast
Comparison: 07/23/2020.

CLINICAL DATA: [name].

EXAM:
CHEST - 2 VIEW

[x chest ap]
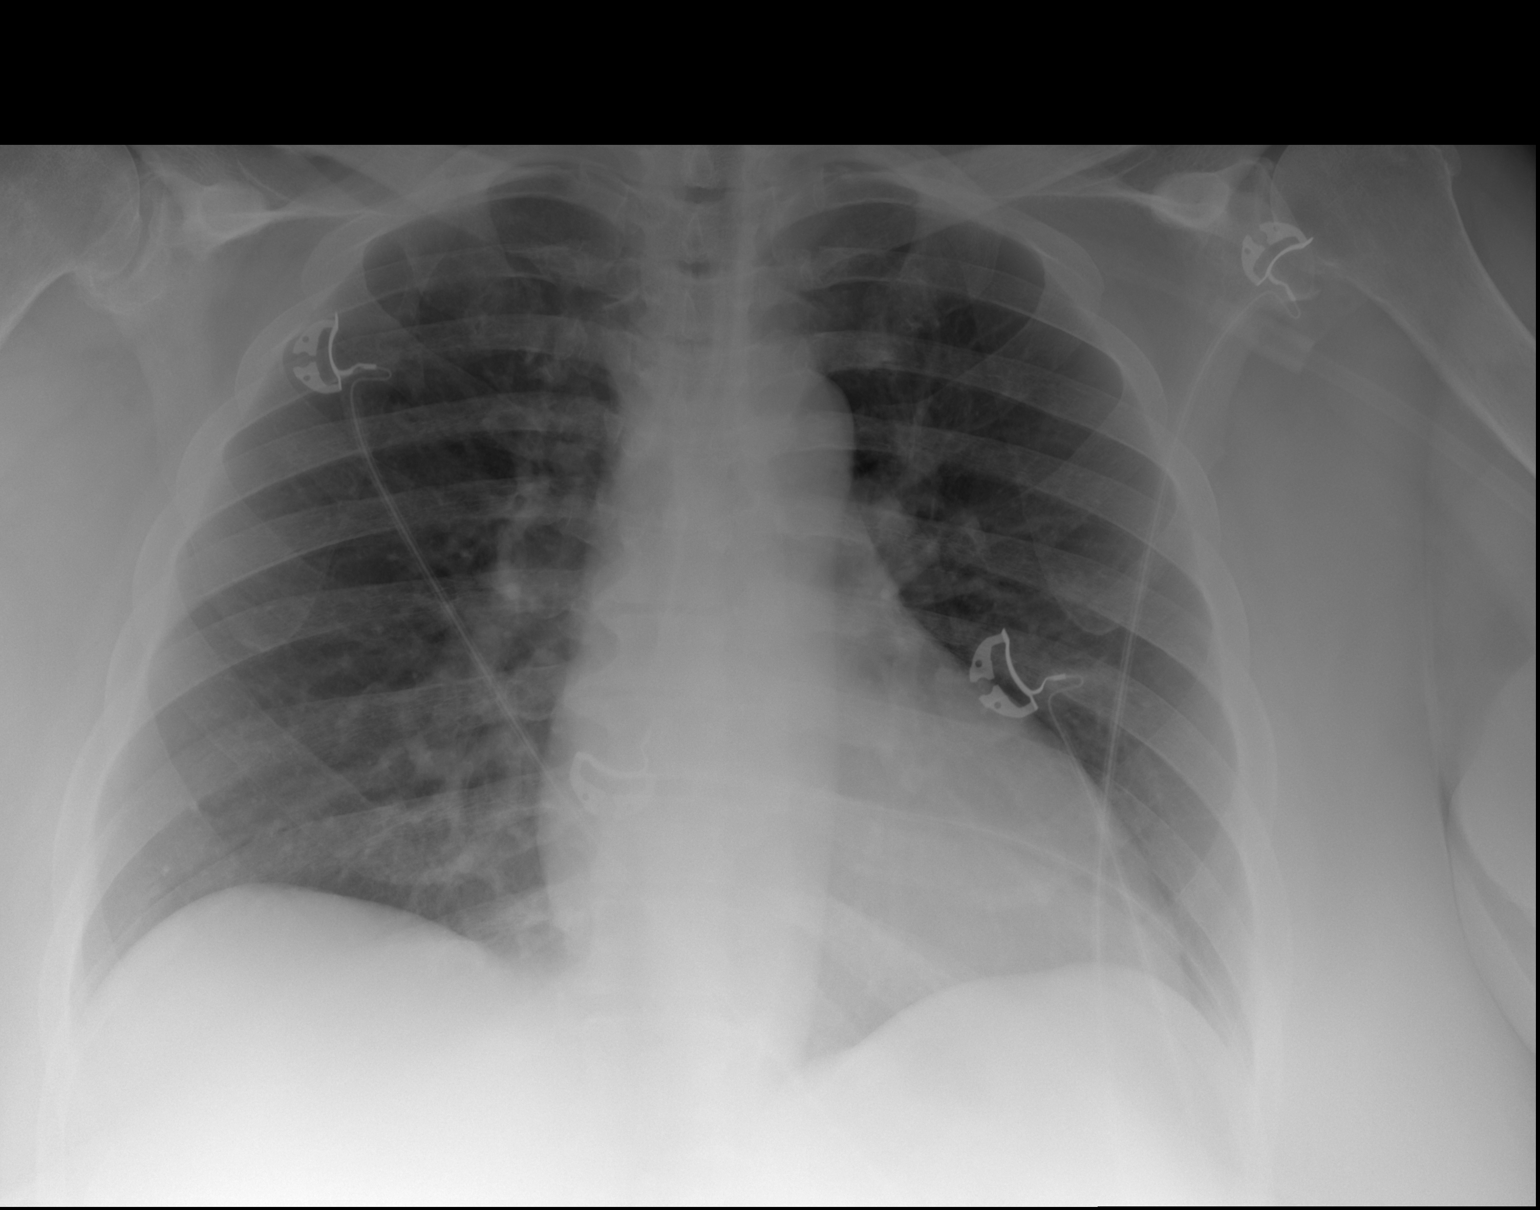

[w chest lat]
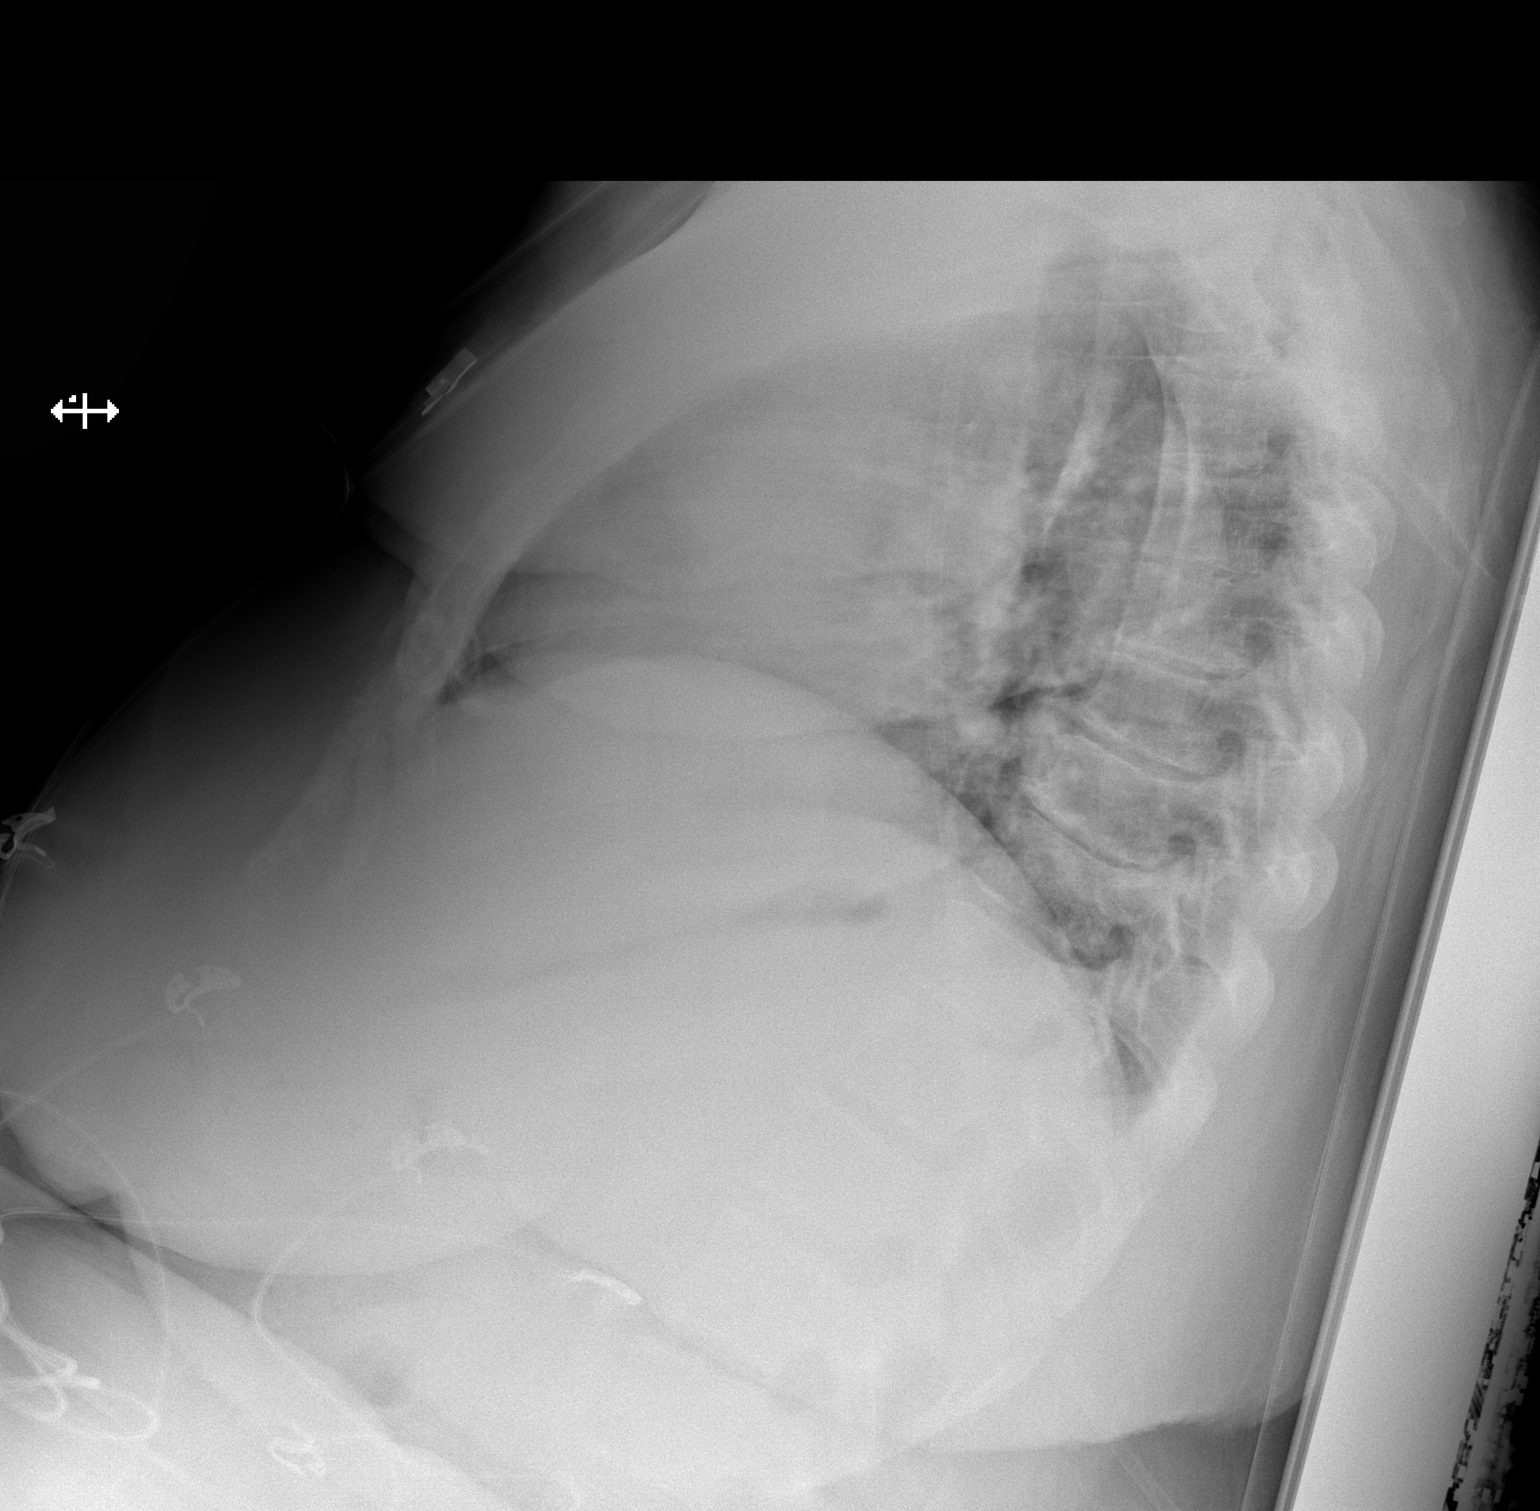

[2 of 2 positions shown; findings below may reference images not displayed]

FINDINGS: Mediastinum and hilar structures normal. Cardiomegaly. No pulmonary
venous congestion. No focal infiltrate. No pleural effusion or
pneumothorax. Degenerative changes both shoulders. No acute bony
abnormality.
IMPRESSION: Cardiomegaly.  No pulmonary venous congestion.

2.  No acute pulmonary disease.

## 2022-10-13 ENCOUNTER — Other Ambulatory Visit: Payer: Self-pay

## 2022-10-13 ENCOUNTER — Emergency Department (HOSPITAL_COMMUNITY)
Admission: EM | Admit: 2022-10-13 | Discharge: 2022-10-13 | Discharge status: Against Medical Advice | Payer: 59 | Attending: Emergency Medicine | Admitting: Emergency Medicine

## 2022-10-13 ENCOUNTER — Emergency Department (HOSPITAL_BASED_OUTPATIENT_CLINIC_OR_DEPARTMENT_OTHER)
Admission: EM | Admit: 2022-10-13 | Discharge: 2022-10-13 | Discharge status: Against Medical Advice | Payer: 59 | Source: Home / Self Care

## 2022-10-13 ENCOUNTER — Encounter (HOSPITAL_BASED_OUTPATIENT_CLINIC_OR_DEPARTMENT_OTHER): Payer: Self-pay

## 2022-10-13 DIAGNOSIS — X58XXXA Exposure to other specified factors, initial encounter: Secondary | ICD-10-CM | POA: Insufficient documentation

## 2022-10-13 DIAGNOSIS — R1084 Generalized abdominal pain: Secondary | ICD-10-CM | POA: Insufficient documentation

## 2022-10-13 DIAGNOSIS — S90821A Blister (nonthermal), right foot, initial encounter: Secondary | ICD-10-CM | POA: Insufficient documentation

## 2022-10-13 DIAGNOSIS — R109 Unspecified abdominal pain: Secondary | ICD-10-CM | POA: Insufficient documentation

## 2022-10-13 DIAGNOSIS — Z5321 Procedure and treatment not carried out due to patient leaving prior to being seen by health care provider: Secondary | ICD-10-CM | POA: Insufficient documentation

## 2022-10-13 LAB — CBC
HCT: 32.1 % — ABNORMAL LOW (ref 36.0–46.0)
Hemoglobin: 10.2 g/dL — ABNORMAL LOW (ref 12.0–15.0)
MCH: 29.6 pg (ref 26.0–34.0)
MCHC: 31.8 g/dL (ref 30.0–36.0)
MCV: 93 fL (ref 80.0–100.0)
Platelets: 355 10*3/uL (ref 150–400)
RBC: 3.45 MIL/uL — ABNORMAL LOW (ref 3.87–5.11)
RDW: 15.4 % (ref 11.5–15.5)
WBC: 13.7 10*3/uL — ABNORMAL HIGH (ref 4.0–10.5)
nRBC: 0 % (ref 0.0–0.2)

## 2022-10-13 LAB — COMPREHENSIVE METABOLIC PANEL
ALT: 7 U/L (ref 0–44)
AST: 8 U/L — ABNORMAL LOW (ref 15–41)
Albumin: 3.7 g/dL (ref 3.5–5.0)
Alkaline Phosphatase: 81 U/L (ref 38–126)
Anion gap: 13 (ref 5–15)
BUN: 33 mg/dL — ABNORMAL HIGH (ref 6–20)
CO2: 26 mmol/L (ref 22–32)
Calcium: 8.8 mg/dL — ABNORMAL LOW (ref 8.9–10.3)
Chloride: 95 mmol/L — ABNORMAL LOW (ref 98–111)
Creatinine, Ser: 3.83 mg/dL — ABNORMAL HIGH (ref 0.44–1.00)
GFR, Estimated: 13 mL/min — ABNORMAL LOW (ref >60)
Glucose, Bld: 226 mg/dL — ABNORMAL HIGH (ref 70–99)
Potassium: 3.5 mmol/L (ref 3.5–5.1)
Sodium: 134 mmol/L — ABNORMAL LOW (ref 135–145)
Total Bilirubin: 0.6 mg/dL (ref 0.3–1.2)
Total Protein: 7.9 g/dL (ref 6.5–8.1)

## 2022-10-13 LAB — LIPASE, BLOOD: Lipase: 27 U/L (ref 11–51)

## 2022-10-13 MED ORDER — HYDROMORPHONE HCL 4 MG PO TABS: 4.0000 mg | ORAL_TABLET | Freq: Three times a day (TID) | ORAL | 0 refills | Status: DC | PRN

## 2022-10-13 NOTE — ED Notes (Signed)
Called pt for triage, pt not answer, pt outside per friend

## 2022-10-13 NOTE — ED Notes (Signed)
Pt called for 2nd time for treatment room, still no answer from pt, pt not seen in [facility] lobby, pt not seen outside. Pt's urine cup with label was sitting on bench outside the ER lobby, pt not seen. Assume pt has left the premises at this time.

## 2022-10-13 NOTE — Telephone Encounter (Signed)
[name] was Reviewed Hydromorphone e-scribed today.

## 2022-10-13 NOTE — ED Notes (Signed)
Pt called for treatment room, pt not seen in [facility] lobby, pt not seen outside [facility] entrance outside as well. Will try again momentary

## 2022-10-13 NOTE — ED Notes (Signed)
Pt seen leaving the ED with family.  

## 2022-10-13 NOTE — Telephone Encounter (Signed)
[facility] [address] [facility]. The prior Rx has been called an cancelled.Because it is not in-stock.

## 2022-10-13 NOTE — ED Triage Notes (Signed)
Pt is complaining of generalized abd pain that she mentions is " all across " her stomach that started about 1 day ago, as well a a blister on her right foot that appeared 2 days ago. She mentions the blister of her foot being painful and making it difficult for her to walk. The blister is located on the bottom of her right heel. Pt has had no nausea or vomiting.

## 2022-10-13 NOTE — ED Triage Notes (Signed)
Pt arrived POV with husband after leaving [facility] for all over abd pain that started yesterday, no n/v/d, also has blister to right heal for 2 days. Pt is a dialysis pt, last treatment was early this am, pt reports pulled off 5 lbs of fluids. VSS, A&O x4. Pt currently drinking Coke Cola

## 2022-10-14 NOTE — Telephone Encounter (Signed)
Patient aware of RX

## 2022-10-19 ENCOUNTER — Encounter (HOSPITAL_COMMUNITY): Payer: Self-pay

## 2022-10-19 ENCOUNTER — Other Ambulatory Visit: Payer: Self-pay

## 2022-10-19 ENCOUNTER — Emergency Department (HOSPITAL_COMMUNITY): Payer: 59

## 2022-10-19 ENCOUNTER — Emergency Department (HOSPITAL_COMMUNITY)
Admission: EM | Admit: 2022-10-19 | Discharge: 2022-10-20 | Disposition: A | Discharge status: Home / Self Care | Payer: 59 | Attending: Emergency Medicine | Admitting: Emergency Medicine

## 2022-10-19 DIAGNOSIS — M79671 Pain in right foot: Secondary | ICD-10-CM | POA: Insufficient documentation

## 2022-10-19 DIAGNOSIS — R1084 Generalized abdominal pain: Secondary | ICD-10-CM | POA: Diagnosis not present

## 2022-10-19 DIAGNOSIS — R079 Chest pain, unspecified: Secondary | ICD-10-CM | POA: Insufficient documentation

## 2022-10-19 DIAGNOSIS — Z794 Long term (current) use of insulin: Secondary | ICD-10-CM | POA: Insufficient documentation

## 2022-10-19 DIAGNOSIS — D72829 Elevated white blood cell count, unspecified: Secondary | ICD-10-CM | POA: Insufficient documentation

## 2022-10-19 DIAGNOSIS — D631 Anemia in chronic kidney disease: Secondary | ICD-10-CM | POA: Diagnosis not present

## 2022-10-19 DIAGNOSIS — R739 Hyperglycemia, unspecified: Secondary | ICD-10-CM | POA: Diagnosis not present

## 2022-10-19 DIAGNOSIS — R600 Localized edema: Secondary | ICD-10-CM | POA: Insufficient documentation

## 2022-10-19 DIAGNOSIS — Z992 Dependence on renal dialysis: Secondary | ICD-10-CM | POA: Insufficient documentation

## 2022-10-19 DIAGNOSIS — Z7901 Long term (current) use of anticoagulants: Secondary | ICD-10-CM | POA: Insufficient documentation

## 2022-10-19 DIAGNOSIS — N186 End stage renal disease: Secondary | ICD-10-CM | POA: Diagnosis not present

## 2022-10-19 DIAGNOSIS — R000 Tachycardia, unspecified: Secondary | ICD-10-CM | POA: Diagnosis not present

## 2022-10-19 LAB — CBC WITH DIFFERENTIAL/PLATELET
Abs Immature Granulocytes: 0.07 10*3/uL (ref 0.00–0.07)
Basophils Absolute: 0.1 10*3/uL (ref 0.0–0.1)
Basophils Relative: 0 %
Eosinophils Absolute: 0.2 10*3/uL (ref 0.0–0.5)
Eosinophils Relative: 1 %
HCT: 30.5 % — ABNORMAL LOW (ref 36.0–46.0)
Hemoglobin: 9.3 g/dL — ABNORMAL LOW (ref 12.0–15.0)
Immature Granulocytes: 1 %
Lymphocytes Relative: 12 %
Lymphs Abs: 1.7 10*3/uL (ref 0.7–4.0)
MCH: 29.2 pg (ref 26.0–34.0)
MCHC: 30.5 g/dL (ref 30.0–36.0)
MCV: 95.9 fL (ref 80.0–100.0)
Monocytes Absolute: 1 10*3/uL (ref 0.1–1.0)
Monocytes Relative: 7 %
Neutro Abs: 11.3 10*3/uL — ABNORMAL HIGH (ref 1.7–7.7)
Neutrophils Relative %: 79 %
Platelets: 437 10*3/uL — ABNORMAL HIGH (ref 150–400)
RBC: 3.18 MIL/uL — ABNORMAL LOW (ref 3.87–5.11)
RDW: 15.4 % (ref 11.5–15.5)
WBC: 14.2 10*3/uL — ABNORMAL HIGH (ref 4.0–10.5)
nRBC: 0 % (ref 0.0–0.2)

## 2022-10-19 LAB — COMPREHENSIVE METABOLIC PANEL
ALT: 9 U/L (ref 0–44)
AST: 11 U/L — ABNORMAL LOW (ref 15–41)
Albumin: 2.5 g/dL — ABNORMAL LOW (ref 3.5–5.0)
Alkaline Phosphatase: 74 U/L (ref 38–126)
Anion gap: 17 — ABNORMAL HIGH (ref 5–15)
BUN: 33 mg/dL — ABNORMAL HIGH (ref 6–20)
CO2: 20 mmol/L — ABNORMAL LOW (ref 22–32)
Calcium: 8.7 mg/dL — ABNORMAL LOW (ref 8.9–10.3)
Chloride: 94 mmol/L — ABNORMAL LOW (ref 98–111)
Creatinine, Ser: 5.68 mg/dL — ABNORMAL HIGH (ref 0.44–1.00)
GFR, Estimated: 8 mL/min — ABNORMAL LOW (ref >60)
Glucose, Bld: 359 mg/dL — ABNORMAL HIGH (ref 70–99)
Potassium: 3.7 mmol/L (ref 3.5–5.1)
Sodium: 131 mmol/L — ABNORMAL LOW (ref 135–145)
Total Bilirubin: 0.3 mg/dL (ref 0.3–1.2)
Total Protein: 7.5 g/dL (ref 6.5–8.1)

## 2022-10-19 LAB — LIPASE, BLOOD: Lipase: 33 U/L (ref 11–51)

## 2022-10-19 LAB — TROPONIN I (HIGH SENSITIVITY): Troponin I (High Sensitivity): 15 ng/L (ref <18)

## 2022-10-19 LAB — MAGNESIUM: Magnesium: 1.8 mg/dL (ref 1.7–2.4)

## 2022-10-19 MED ORDER — DOXYCYCLINE HYCLATE 100 MG PO CAPS: 100.0000 mg | ORAL_CAPSULE | Freq: Two times a day (BID) | ORAL | 0 refills | Status: DC

## 2022-10-19 MED ORDER — HYDROMORPHONE HCL 1 MG/ML IJ SOLN
1.0000 mg | Freq: Once | INTRAMUSCULAR | Status: AC
  Administered 2022-10-19: 1 mg via INTRAVENOUS
  Filled 2022-10-19: qty 1

## 2022-10-19 NOTE — ED Notes (Signed)
Ptar called pt to [facility] ETA

## 2022-10-19 NOTE — Discharge Instructions (Addendum)
You were seen in the emergency department for chest pain and shortness of breath, worsening abdominal pain and worsening pain in your legs especially her right foot.  You had lab work chest x-ray EKG.  You were given pain medication with improvement in your symptoms.  We are putting you on an antibiotic for a possible skin infection of your foot.  It will be important that you follow-up with your primary care doctor for further evaluation.

## 2022-10-19 NOTE — ED Notes (Signed)
Patient reports she is a difficult IV stick. Notified Dr. Charm Barges. IV team consult placed at this time.

## 2022-10-19 NOTE — ED Notes (Signed)
Will need [facility] transport for ride home.

## 2022-10-19 NOTE — ED Notes (Signed)
Joselyn Glassman, RN trying for US guided IV at this time.

## 2022-10-19 NOTE — ED Triage Notes (Signed)
Patient states she has abdominal pain and foot pain as well. Abdominal pain is a sharp stabbing shooting pain.

## 2022-10-19 NOTE — ED Provider Notes (Signed)
[place] [facility]GENCY DEPARTMENT AT [facility] Provider Note   [name]: [id number] Arrival date & time: 10/19/22  1843     History {Add pertinent medical, surgical, social history, OB history to HPI:1} Chief Complaint  Patient presents with   Chest Pain    Deborah [name] is a 57 y.o. female.  She is brought in by EMS from home for evaluation of chest pain abdominal pain pain in her feet.  She has a history of end-stage renal disease on dialysis Monday Wednesday Friday and went Monday.  She said when she woke up this morning around 11 AM she had severe chest pain and lower abdominal pain.  She tried her anxiety medicine and pain medicine without improvement.  She is also had a chronic wound on her right heel and both of her legs hurt.  She said she has had these problems before but they are worse today, rates it 10 out of 10.  She is asking for Dilaudid.  The history is provided by the patient.  Chest Pain Pain location:  L chest Pain quality: aching   Pain radiates to:  L arm Pain severity:  Severe Onset quality:  Gradual Duration:  8 hours Timing:  Constant Progression:  Improving Chronicity:  Recurrent Context: at rest   Relieved by:  Nothing Worsened by:  Nothing Ineffective treatments:  None tried Associated symptoms: abdominal pain   Associated symptoms: no fever, no shortness of breath and no vomiting   Abdominal pain:    Location:  Generalized   Quality: aching     Severity:  Severe   Timing:  Constant   Progression:  Unchanged   Chronicity:  Recurrent      Home Medications Prior to Admission medications   Medication Sig Start Date End Date Taking? Authorizing Provider  albuterol (PROVENTIL) (2.5 MG/3ML) 0.083% nebulizer solution Take 3 mLs (2.5 mg total) by nebulization every 6 (six) hours as needed for wheezing or shortness of breath. 04/06/19   Bing Neighbors, NP  allopurinol (ZYLOPRIM) 100 MG tablet Take by mouth. 08/05/22   [provider]  amLODipine (NORVASC) 10 MG tablet Take by mouth. 08/16/22   [provider]  apixaban (ELIQUIS) 5 MG TABS tablet Take 5 mg by mouth 2 (two) times daily. 12/03/21   [provider]  atorvastatin (LIPITOR) 10 MG tablet Take 10 mg by mouth at bedtime. 12/03/21   [provider]  AURYXIA 1 GM 210 MG(Fe) tablet Take by mouth. 08/09/22   [provider]  busPIRone (BUSPAR) 5 MG tablet Take 5 mg by mouth 2 (two) times daily.    [provider]  carvedilol (COREG) 12.5 MG tablet Take 1 tablet (12.5 mg total) by mouth 2 (two) times daily with a meal. 06/04/22   Leroy Sea, MD  cetirizine (ZYRTEC) 10 MG tablet Take by mouth. 08/05/22   [provider]  colchicine 0.6 MG tablet Take 0.5 tablets (0.3 mg total) by mouth 2 (two) times a week. 07/15/22   Zannie Cove, MD  donepezil (ARICEPT) 5 MG tablet Take by mouth. 08/05/22   [provider]  Doxercalciferol (HECTOROL IV) Doxercalciferol (Hectorol) 08/20/22 08/19/23  [provider]  DULoxetine HCl 40 MG CPEP Take by mouth. 08/05/22   [provider]  EASY COMFORT PEN NEEDLES 31G X 5 MM MISC USE 3 TIMES A DAY FOR INSULIN ADMINISTRATION 11/14/19   Meccariello, Solmon Ice, MD  famotidine (PEPCID) 20 MG tablet Take 1 tablet (20 mg  total) by mouth daily. 03/22/22 07/20/22  Cora Collum, DO  ferrous sulfate 325 (65 FE) MG tablet Take 1 tablet (325 mg total) by mouth 2 (two) times daily with a meal. 05/27/22   Kathlen Mody, MD  fluticasone (FLONASE) 50 MCG/ACT nasal spray Place 2 sprays into both nostrils daily as needed for allergies or rhinitis. Patient taking differently: Place 1 spray into both nostrils daily as needed for allergies. 12/19/18   Rai, Delene Ruffini, MD  folic acid (FOLVITE) 1 MG tablet Take 1 mg by mouth daily. 04/07/21   [provider]  furosemide (LASIX) 80 MG tablet Take 1 tablet (80 mg total) by mouth daily. 07/06/22   Rhetta Mura, MD   HYDROmorphone (DILAUDID) 4 MG tablet Take 1 tablet (4 mg total) by mouth 3 (three) times daily as needed for moderate pain. Three times a day as needed 20 days out of the month. 10/13/22   Jones Bales, NP  insulin aspart (NOVOLOG) 100 UNIT/ML injection Inject 5 Units into the skin 3 (three) times daily with meals. 06/04/22   Leroy Sea, MD  insulin glargine-yfgn (SEMGLEE) 100 UNIT/ML injection Inject 0.15 mLs (15 Units total) into the skin 2 (two) times daily. 07/05/22   Rhetta Mura, MD  insulin lispro (HUMALOG) 100 UNIT/ML KwikPen Before each meal 3 times a day, 140-199 - 2 units, 200-250 - 6 units, 251-299 - 8 units,  300-349 - 12 units,  350 or above 14 units. Patient taking differently: Inject 2-14 Units into the skin See admin instructions. Before each meal and at bedtime. 140-199 - 2 units, 200-250 - 6 units, 251-299 - 8 units,  300-349 - 12 units,  350 or above 14 units. 06/04/22   Leroy Sea, MD  insulin lispro (HUMALOG) 100 UNIT/ML KwikPen Inject into the skin. 08/30/22   [provider]  isosorbide mononitrate (IMDUR) 30 MG 24 hr tablet Take by mouth. Patient not taking: Reported on 09/14/2022 08/05/22   [provider]  isosorbide mononitrate (IMDUR) 60 MG 24 hr tablet Take 1 tablet by mouth daily. 08/31/22 08/31/23  [provider]  LANTUS SOLOSTAR 100 UNIT/ML Solostar Pen Inject into the skin. 09/10/20   [provider]  loperamide (IMODIUM A-D) 2 MG tablet Take 2 mg by mouth every 6 (six) hours as needed for diarrhea or loose stools.    [provider]  melatonin 3 MG TABS tablet Take by mouth. 10/21/20   [provider]  Methoxy PEG-Epoetin Beta (MIRCERA IJ) Mircera 08/06/22 08/05/23  [provider]  metoprolol succinate (TOPROL-XL) 50 MG 24 hr tablet Take by mouth. 10/21/20   [provider]  mirtazapine (REMERON SOL-TAB) 15 MG disintegrating tablet Take 1 tablet (15 mg total) by mouth at bedtime.  07/05/22   Rhetta Mura, MD  Multiple Vitamin (MULTIVITAMIN WITH MINERALS) TABS tablet Take 1 tablet by mouth daily. 05/28/22   Kathlen Mody, MD  omeprazole (PRILOSEC) 20 MG capsule Take by mouth. 08/05/22   [provider]  ondansetron (ZOFRAN ODT) 4 MG disintegrating tablet Take 1 tablet (4 mg total) by mouth every 8 (eight) hours as needed for nausea or vomiting. 05/30/21   [name], MD  pantoprazole (PROTONIX) 40 MG tablet TAKE 1 TABLET BY MOUTH 2 (TWO) TIMES DAILY. (AM+BEDTIME) 08/30/22   Raulkar, Drema Pry, MD  spironolactone (ALDACTONE) 25 MG tablet Take 12.5 mg by mouth daily. 08/30/22   [provider]  torsemide (DEMADEX) 100 MG tablet TAKE 1/2 TABLET (50 MG TOTAL)  BY MOUTH 2 TIMES DAILY (AM+BEDTIME) Patient not taking: Reported on 09/14/2022 08/31/22   [provider]  torsemide (DEMADEX) 20 MG tablet TAKE 1 TABLET DAILY AS NEEDED FOR LEG EDEMA/SWELLING (AM) 08/31/22   [provider]  Vitamin D, Ergocalciferol, (DRISDOL) 1.25 MG (50000 UNIT) CAPS capsule Take 50,000 Units by mouth once a week. 04/07/21   [provider]      Allergies    Diazepam, Gabapentin, Iodinated contrast media, Iopamidol, Lisinopril, Metoclopramide, Nsaids, Penicillins, Chlorhexidine, Tolmetin, Aspartame and phenylalanine, Aspirin, Dicyclomine, Rifamycin, Acetaminophen, Cyclobenzaprine, Oxycodone, Rifamycins, and Tramadol    Review of Systems   Review of Systems  Constitutional:  Negative for fever.  Respiratory:  Negative for shortness of breath.   Cardiovascular:  Positive for chest pain.  Gastrointestinal:  Positive for abdominal pain. Negative for vomiting.  Musculoskeletal:  Positive for gait problem.  Skin:  Positive for wound.    Physical Exam Updated Vital Signs BP 135/72   Pulse (!) 120   Resp 19   Ht 5\' 6"  (1.676 m)   Wt 132.9 kg   LMP 10/10/2012   SpO2 98%   BMI 47.29 kg/m  Physical Exam Vitals and nursing note reviewed.   Constitutional:      General: She is not in acute distress.    Appearance: She is well-developed. She is obese.  HENT:     Head: Normocephalic and atraumatic.  Eyes:     Conjunctiva/sclera: Conjunctivae normal.  Cardiovascular:     Rate and Rhythm: Regular rhythm. Tachycardia present.     Heart sounds: Normal heart sounds. No murmur heard.    Comments: She has a dialysis catheter in her right upper chest without surrounding erythema or tenderness Pulmonary:     Effort: Pulmonary effort is normal. No respiratory distress.     Breath sounds: Normal breath sounds.  Abdominal:     Palpations: Abdomen is soft.     Tenderness: There is abdominal tenderness (generalized). There is no guarding or rebound.  Musculoskeletal:     Cervical back: Neck supple.     Right lower leg: Tenderness present. Edema present.     Left lower leg: Tenderness present. Edema present.  Skin:    General: Skin is warm and dry.     Capillary Refill: Capillary refill takes less than 2 seconds.  Neurological:     General: No focal deficit present.     Mental Status: She is alert.  Psychiatric:        Mood and Affect: Mood normal.     ED Results / Procedures / Treatments   Labs (all labs ordered are listed, but only abnormal results are displayed) Labs Reviewed  COMPREHENSIVE METABOLIC PANEL  CBC WITH DIFFERENTIAL/PLATELET  LIPASE, BLOOD  MAGNESIUM  TROPONIN I (HIGH SENSITIVITY)    EKG None  Radiology No results found.  Procedures Procedures  {Document cardiac monitor, telemetry assessment procedure when appropriate:1}  Medications Ordered in ED Medications  HYDROmorphone (DILAUDID) injection 1 mg (has no administration in time range)    ED Course/ Medical Decision Making/ A&P   {   Click here for ABCD2, HEART and other calculatorsREFRESH Note before signing :1}                              Medical Decision Making Amount and/or Complexity of Data Reviewed Labs: ordered. Radiology:  ordered.  Risk Prescription drug management.   This patient complains of ***; this involves an  extensive number of treatment Options and is a complaint that carries with it a high risk of complications and morbidity. The differential includes ***  I ordered, reviewed and interpreted labs, which included *** I ordered medication *** and reviewed [name] when indicated. I ordered imaging studies which included *** and I independently    visualized and interpreted imaging which showed *** Additional history obtained from *** Previous records obtained and reviewed *** I consulted *** and discussed lab and imaging findings and discussed disposition.  Cardiac monitoring reviewed, *** Social determinants considered, *** Critical Interventions: ***  After the interventions stated above, I reevaluated the patient and found *** Admission and further testing considered, ***   {Document critical care time when appropriate:1} {Document review of labs and clinical decision tools ie heart score, Chads2Vasc2 etc:1}  {Document your independent review of radiology images, and any outside records:1} {Document your discussion with family members, caretakers, and with consultants:1} {Document social determinants of health affecting pt's care:1} {Document your decision making why or why not admission, treatments were needed:1} Final Clinical Impression(s) / ED Diagnoses Final diagnoses:  None    Rx / DC Orders ED Discharge Orders     None

## 2022-10-19 NOTE — ED Triage Notes (Signed)
Patient bib [facility] from home with complaints of chest pain.  Pain started around noon and has been increasing through out the day. Patient took anxiety meds thinking it would help it calmed her down but did not take the pain away. Patient refused an IV with EMS so they did not give nitro, patient states she has an allergy to aspirin so she did not received that either. Chest pain is on the Left side and radiates to left arm and rates the pain 10/10.   PMH:   HD on Monday, Wednesday, Friday, previous MI and stroke. Patient on eliquis.

## 2022-10-19 NOTE — ED Notes (Signed)
Applied bandage to right heel where pt has blister. Blister not open. Covered with gauze dressing per request.

## 2022-10-24 IMAGING — DX DG CHEST 1V PORT
1 series · 1 of 1 positions shown · non-contrast
Comparison: 08/28/2020

CLINICAL DATA: Abdominal pain with nausea and vomiting since 9
a.m. Gastric cancer.

EXAM:
PORTABLE CHEST 1 VIEW

[chest ap]
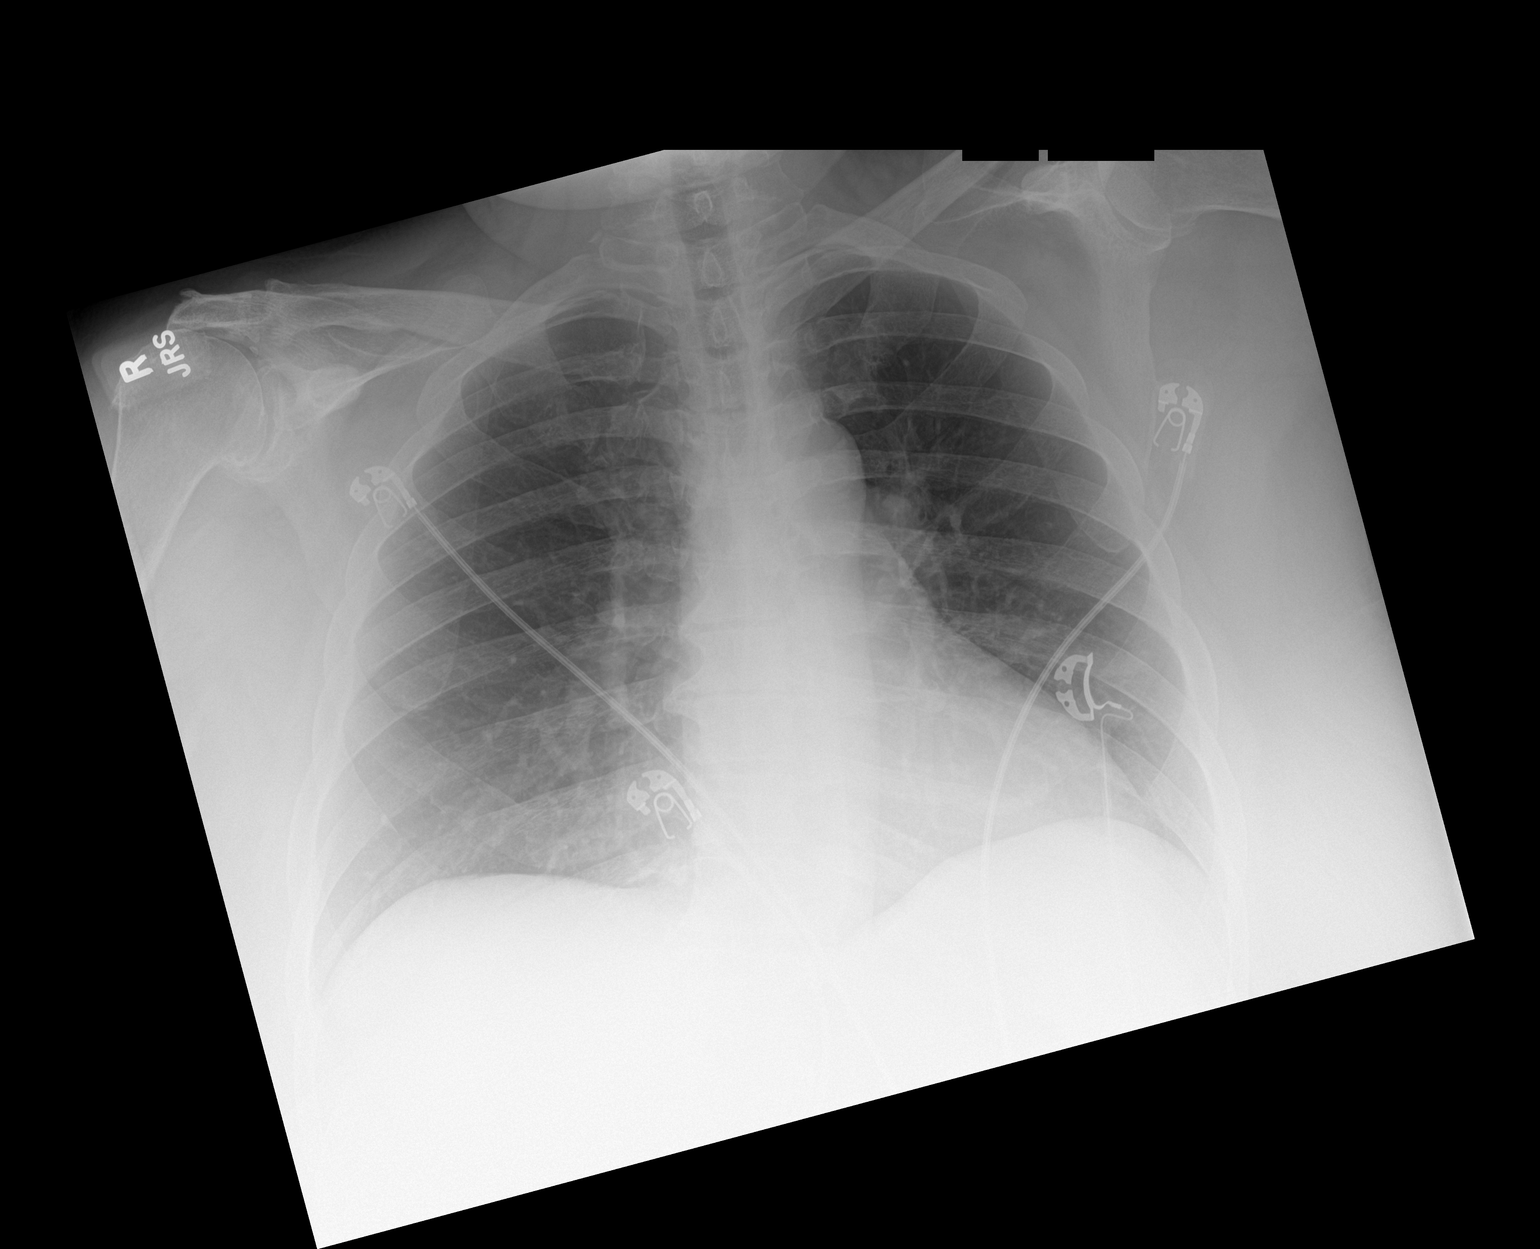

[1 of 1 positions shown; findings below may reference images not displayed]

FINDINGS: Midline trachea. Normal heart size for level of inspiration. No
pleural effusion or pneumothorax. Clear lungs.
IMPRESSION: No acute cardiopulmonary disease.

## 2022-10-24 IMAGING — CT CT ABD-PELV W/O CM
2 of 7 series · 14 of 46 positions shown, 19 images · non-contrast
Comparison: 08/28/2020 and prior CTs

CLINICAL DATA: 55-year-old female with acute abdominal and pelvic
pain with nausea and vomiting. History of gastric cancer.

EXAM:
CT ABDOMEN AND PELVIS WITHOUT CONTRAST
TECHNIQUE: Multidetector CT imaging of the abdomen and pelvis was performed
following the standard protocol without IV contrast.

[Series 2: axial st · axial · 0.87mm/px · z∈[+1160,+1520]mm · 11 of 84 slices shown, 16 images]
[im 6/84  soft-tissue]
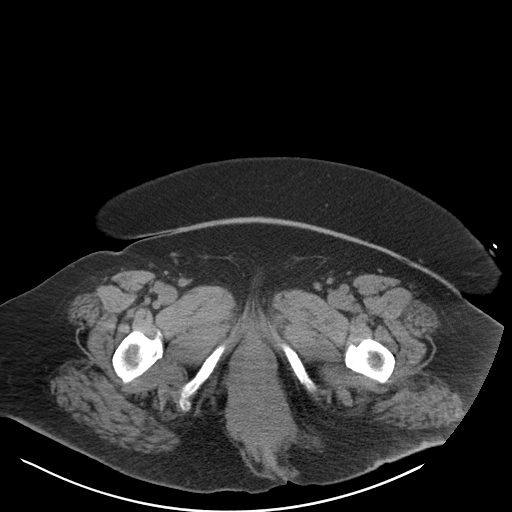
[im 6/84  bone]
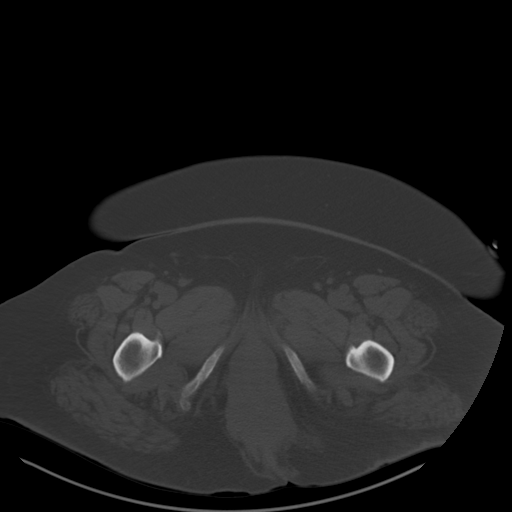
[im 12/84  soft-tissue]
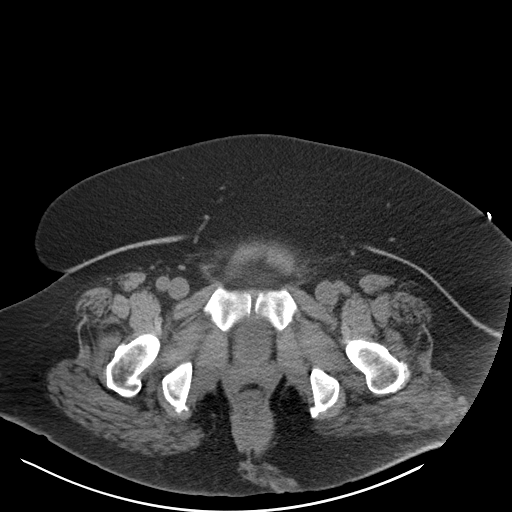
[im 24/84  soft-tissue]
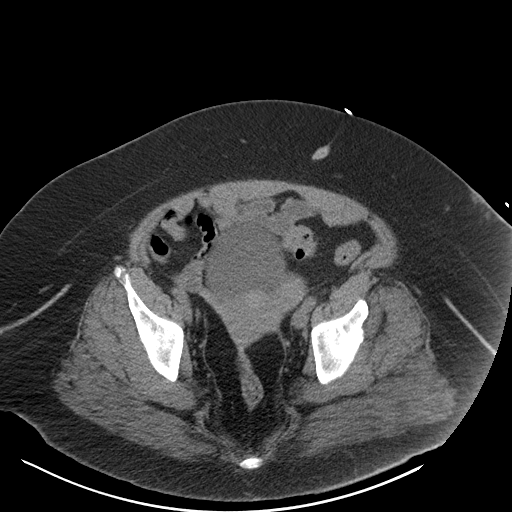
[im 30/84  soft-tissue]
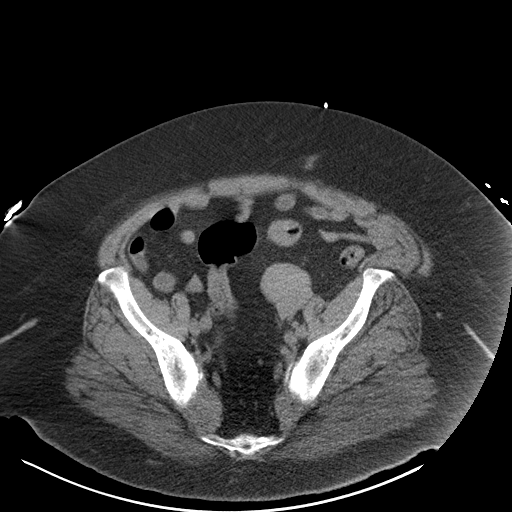
[im 36/84  soft-tissue]
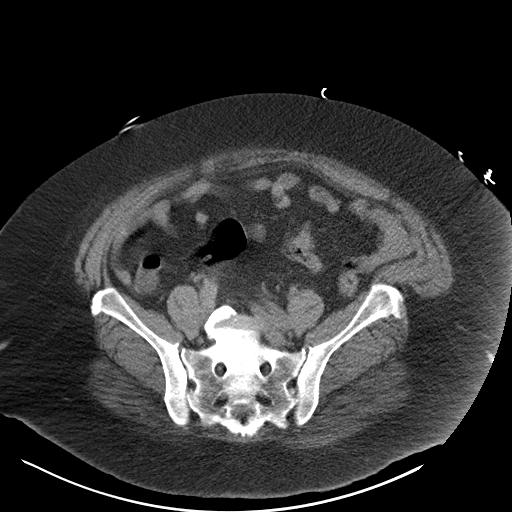
[im 48/84  soft-tissue]
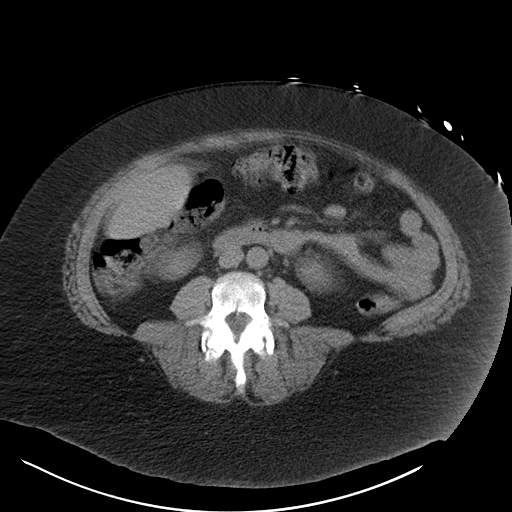
[im 54/84  soft-tissue]
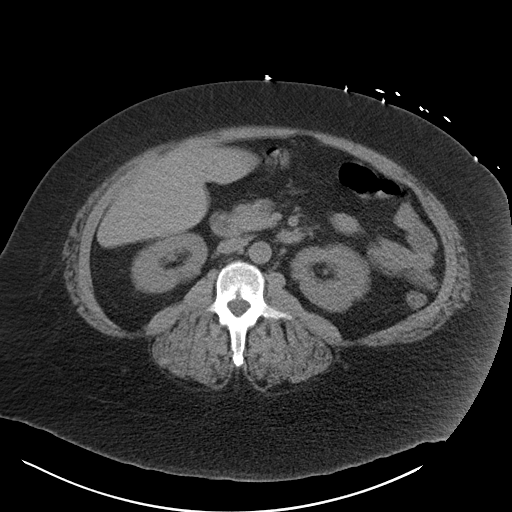
[im 60/84  soft-tissue]
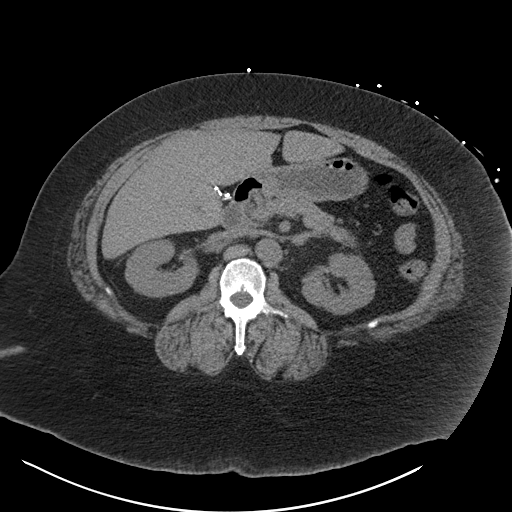
[im 60/84  lung]
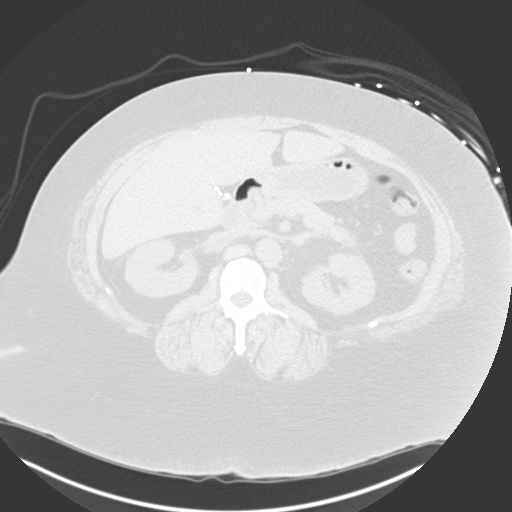
[im 66/84  lung]
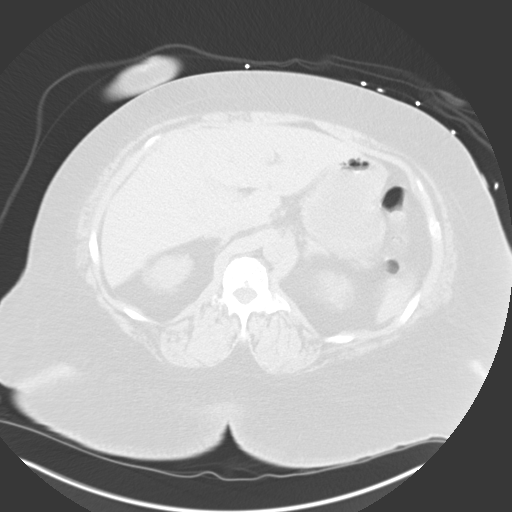
[im 72/84  soft-tissue]
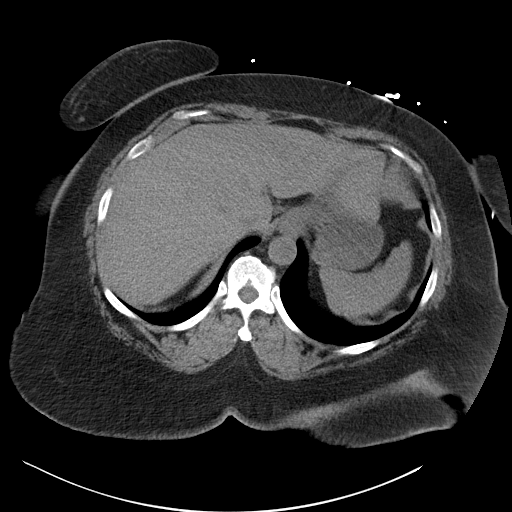
[im 72/84  lung]
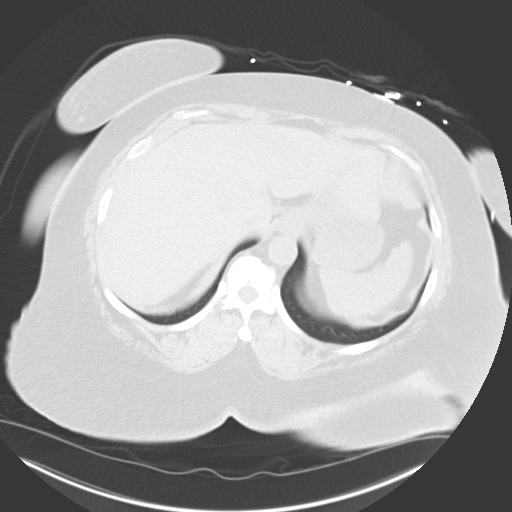
[im 72/84  bone]
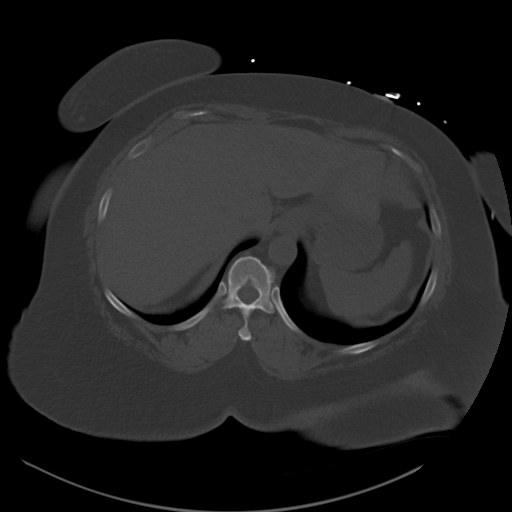
[im 78/84  soft-tissue]
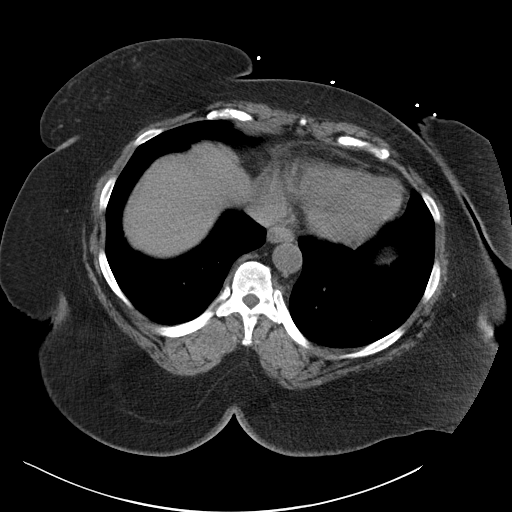
[im 78/84  lung]
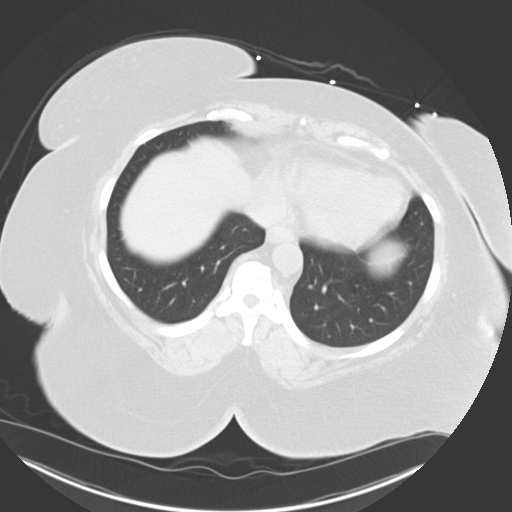

[Series 7: coronal st · coronal · 0.85mm/px · 3 of 178 slices shown]
[im 45/178  soft-tissue]
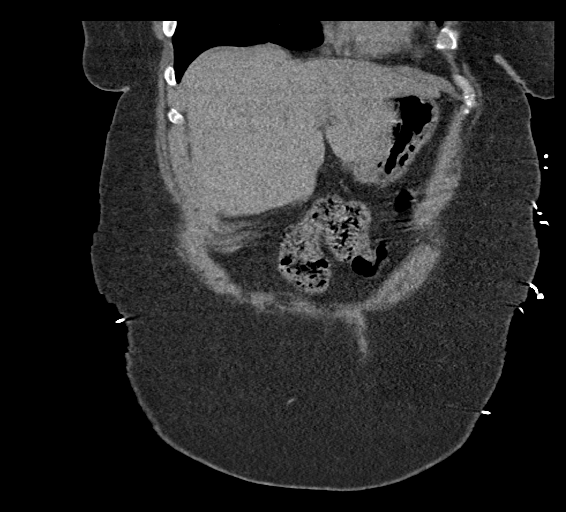
[im 89/178  soft-tissue]
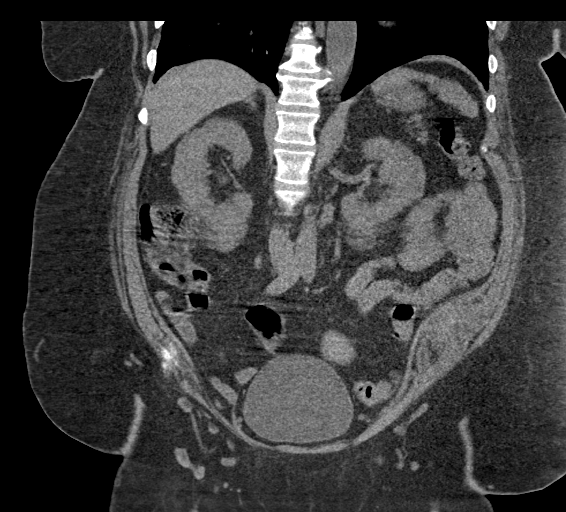
[im 133/178  soft-tissue]
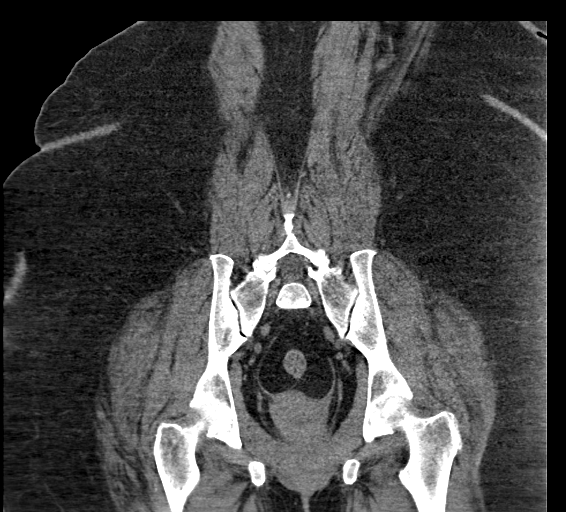

[14 of 46 positions shown; findings below may reference images not displayed]

FINDINGS: Please note that parenchymal abnormalities may be missed without
intravenous contrast.

Lower chest: No acute abnormality

Hepatobiliary: The liver is unremarkable. The patient is status post
cholecystectomy. No biliary dilatation.

Pancreas: Unremarkable

Spleen: Unremarkable

Adrenals/Urinary Tract: The kidneys, adrenal glands and bladder are
unremarkable.

Stomach/Bowel: Stomach is within normal limits. Appendix appears
normal. No evidence of bowel wall thickening, distention, or
inflammatory changes.

Vascular/Lymphatic: Aortic atherosclerosis. No enlarged abdominal or
pelvic lymph nodes.

Reproductive: A 4.7 cm LEFT pedunculated uterine fibroid is again
noted. No other significant abnormalities.

Other: No ascites, focal collection or pneumoperitoneum.

Musculoskeletal: No acute or suspicious bony abnormalities
identified.
IMPRESSION: 1. No evidence of acute abnormality.
2. Unchanged 4.7 cm LEFT uterine fibroid.
3. Aortic Atherosclerosis ([id number]).

## 2022-10-26 ENCOUNTER — Ambulatory Visit (INDEPENDENT_AMBULATORY_CARE_PROVIDER_SITE_OTHER): Payer: 59 | Admitting: Orthopedic Surgery

## 2022-10-26 ENCOUNTER — Ambulatory Visit (HOSPITAL_COMMUNITY): Payer: 59 | Attending: Physician Assistant

## 2022-10-26 ENCOUNTER — Other Ambulatory Visit (INDEPENDENT_AMBULATORY_CARE_PROVIDER_SITE_OTHER): Payer: 59

## 2022-10-26 DIAGNOSIS — M79671 Pain in right foot: Secondary | ICD-10-CM | POA: Diagnosis not present

## 2022-10-26 DIAGNOSIS — L97411 Non-pressure chronic ulcer of right heel and midfoot limited to breakdown of skin: Secondary | ICD-10-CM | POA: Diagnosis not present

## 2022-11-05 ENCOUNTER — Encounter: Payer: Self-pay | Admitting: Orthopedic Surgery

## 2022-11-05 NOTE — Progress Notes (Addendum)
Office Visit Note   Patient: Deborah [name]           Date of Birth: 21-Sep-1965           MRN: [id number] Visit Date: 10/26/2022              Requested by: No referring provider defined for this encounter. PCP: Pcp, No  Chief Complaint  Patient presents with   Right Heel - Pain      HPI: Patient is a 57 year old woman who is seen for initial evaluation for right heel ulcer.  Patient has been to the emergency room but did leave without being seen August 13.  Patient is currently on doxycycline.  she is diabetic and on dialysis Monday Wednesday Friday.  she is on Eliquis 5 mg a day.  Patient states she has had a blister over the heel for 2 weeks.  Assessment & Plan: Visit Diagnoses:  1. Pain of right heel   2. Non-pressure chronic ulcer of right heel and midfoot limited to breakdown of skin (HCC)     Plan: Ulcer was debrided patient will begin routine wound care she will stay on the doxycycline.  A PRAFO was applied to offload pressure.  Follow-Up Instructions: No follow-ups on file.   Ortho Exam  Patient is alert, oriented, no adenopathy, well-dressed, normal affect, normal respiratory effort. Examination patient is currently ambulating in a wheelchair.  she is full assist for transfers.  Radiograph shows no destructive bony changes but there is calcified arteries.  After informed consent a 10 blade knife was used to debride the skin and soft tissue back to healthy viable tissue.  The ulcer is 8 cm in diameter and 2 mm deep after debridement.  Most recent hemoglobin A1c is 8.1.  Imaging: No results found. No images are attached to the encounter.  Labs: Lab Results  Component Value Date   HGBA1C 8.1 (H) 06/19/2022   HGBA1C 10.1 (H) 03/21/2022   HGBA1C 7.5 (H) 08/16/2021   CRP 0.7 06/04/2022   CRP 1.3 (H) 06/03/2022   CRP 2.7 (H) 06/02/2022   LABURIC 6.2 07/04/2022   LABURIC 8.0 (H) 06/20/2022   LABURIC 10.4 (H) 06/03/2022   REPTSTATUS 07/03/2022 FINAL  06/28/2022   CULT  06/28/2022    NO GROWTH 5 DAYS Performed at [facility], [address], [place], [place] [address]    LABORGA ESCHERICHIA COLI (A) 08/15/2021     Lab Results  Component Value Date   ALBUMIN 2.5 (L) 10/19/2022   ALBUMIN 3.7 10/13/2022   ALBUMIN 2.5 (L) 09/10/2022    Lab Results  Component Value Date   MG 1.8 10/19/2022   MG 1.8 09/10/2022   MG 1.4 (L) 09/09/2022   No results found for: "VD25OH"  No results found for: "PREALBUMIN"    Latest Ref Rng & Units 10/19/2022    9:16 PM 10/13/2022    9:05 PM 09/10/2022    2:10 AM  CBC EXTENDED  WBC 4.0 - 10.5 K/uL 14.2  13.7  12.4   RBC 3.87 - 5.11 MIL/uL 3.18  3.45  3.00   Hemoglobin 12.0 - 15.0 g/dL 9.3  19.1  8.7   HCT 47.8 - 46.0 % 30.5  32.1  29.0   Platelets 150 - 400 K/uL 437  355  403   NEUT# 1.7 - 7.7 K/uL 11.3     Lymph# 0.7 - 4.0 K/uL 1.7        There is no height or weight  on file to calculate BMI.  Orders:  Orders Placed This Encounter  Procedures   XR Os Calcis Right   No orders of the defined types were placed in this encounter.    Procedures: No procedures performed  Clinical Data: No additional findings.  ROS:  All other systems negative, except as noted in the HPI. Review of Systems  Objective: Vital Signs: LMP 10/10/2012   Specialty Comments:  No specialty comments available.  PMFS History: Patient Active Problem List   Diagnosis Date Noted   Elevated troponin 09/09/2022   Nausea & vomiting 09/09/2022   End stage renal disease on dialysis (HCC) 09/09/2022   Chronic a-fib (HCC) 09/09/2022   Atrial fibrillation with RVR (HCC)-resolved 09/09/2022   Peripheral neuropathy 09/09/2022   (HFpEF) heart failure with preserved ejection fraction (HCC) 09/09/2022   Hypotension 09/09/2022   CHF (congestive heart failure) (HCC) 07/13/2022   HCAP (healthcare-associated pneumonia) 06/19/2022   Acute renal failure superimposed on stage 4 chronic kidney disease (HCC)  05/29/2022   Acute encephalopathy 05/29/2022   Hyponatremia 05/29/2022   Chronic anemia 05/22/2022   Hyperosmolar hyperglycemic state (HHS) (HCC) 05/11/2022   Hyperosmolar non-ketotic state due to type 2 diabetes mellitus (HCC) 05/11/2022   Leukocytosis 05/11/2022   Edema of left upper extremity 03/21/2022   Tachycardia 03/18/2022   Atypical chest pain 09/10/2021   Acute pyelonephritis    Acute kidney injury superimposed on chronic kidney disease (HCC) 08/16/2021   Acute on chronic kidney failure (HCC) 08/15/2021   PAF (paroxysmal atrial fibrillation) (HCC)    Chronic pain 09/04/2019   Malignant carcinoid tumor of duodenum (HCC)    Nonalcoholic fatty liver disease 06/05/2019   Restrictive lung disease secondary to obesity    History of gastric ulcer    Fibroid uterus 02/23/2019   Abdominal pain 07/17/2018   Anxiety 11/29/2017   Spinal stenosis, lumbar region with neurogenic claudication 08/03/2017   GERD (gastroesophageal reflux disease) 03/19/2017   Depression 03/19/2017   Morbid obesity (HCC)    Normocytic anemia 08/16/2016   Diabetic gastroparesis (HCC) 08/16/2016   Gout 06/05/2016   Chronic hypokalemia 09/26/2015   Hypomagnesemia and hypokalemia 09/26/2015   Insulin dependent type 2 diabetes mellitus (HCC) 05/25/2015   Chronic kidney disease (CKD), stage IV (severe) (HCC) 05/25/2015   Essential hypertension 09/28/2013   Past Medical History:  Diagnosis Date   Acute back pain with sciatica, left    Acute back pain with sciatica, right    AKI (acute kidney injury) (HCC)    Anemia, unspecified    Cancer (HCC)    Carcinoid tumor of duodenum    Chest pain with normal coronary angiography 2019   Chronic kidney disease, stage 3b (HCC)    Chronic pain    Chronic systolic CHF (congestive heart failure) (HCC)    Diabetes mellitus    DKA (diabetic ketoacidosis) (HCC)    Drug-seeking behavior    21 hospitalizations and 14 CT a/p in 2 years for N/V and abdominal pain,  demanding only IV dilaudid   Elevated troponin    chronic   Esophageal reflux    Fibromyalgia    Gastric ulcer    Gastroparesis    Gout    Hyperlipidemia    Hypertension    Hypokalemia    Hypomagnesemia    Lumbosacral stenosis    LVH (left ventricular hypertrophy)    Morbid obesity (HCC)    NICM (nonischemic cardiomyopathy) (HCC)    PAF (paroxysmal atrial fibrillation) (HCC)    Stroke (HCC) 02/2011  Thrombocytosis    Vitamin B12 deficiency anemia     Family History  Problem Relation Age of Onset   Diabetes Mother    Diabetes Father    Heart disease Father    Diabetes Sister    Congestive Heart Failure Sister 30   Diabetes Brother     Past Surgical History:  Procedure Laterality Date   AV FISTULA PLACEMENT Left 06/30/2022   Procedure: LEFT BRACHIOCEPHALIC ARTERIOVENOUS (AV) FISTULA CREATION;  Surgeon: Cephus Shelling, MD;  Location: [facility] OR;  Service: Vascular;  Laterality: Left;   BIOPSY  07/27/2019   Procedure: BIOPSY;  Surgeon: Vida Rigger, MD;  Location: [facility]COPY;  Service: Endoscopy;;   BIOPSY  07/30/2019   Procedure: BIOPSY;  Surgeon: Kathi Der, MD;  Location: [facility];  Service: Gastroenterology;;   CATARACT EXTRACTION  01/2014   CHOLECYSTECTOMY     COLONOSCOPY WITH PROPOFOL N/A 07/30/2019   Procedure: COLONOSCOPY WITH PROPOFOL;  Surgeon: Kathi Der, MD;  Location: [facility];  Service: Gastroenterology;  Laterality: N/A;   ESOPHAGOGASTRODUODENOSCOPY N/A 07/27/2019   Procedure: ESOPHAGOGASTRODUODENOSCOPY (EGD);  Surgeon: Vida Rigger, MD;  Location: [facility];  Service: Endoscopy;  Laterality: N/A;   ESOPHAGOGASTRODUODENOSCOPY N/A 07/26/2020   Procedure: ESOPHAGOGASTRODUODENOSCOPY (EGD);  Surgeon: Willis Modena, MD;  Location: [facility];  Service: Endoscopy;  Laterality: N/A;   ESOPHAGOGASTRODUODENOSCOPY (EGD) WITH PROPOFOL N/A 08/02/2019   Procedure: ESOPHAGOGASTRODUODENOSCOPY (EGD) WITH PROPOFOL;  Surgeon: Kathi Der, MD;   Location: [facility];  Service: Gastroenterology;  Laterality: N/A;   HEMOSTASIS CLIP PLACEMENT  08/02/2019   Procedure: HEMOSTASIS CLIP PLACEMENT;  Surgeon: Kathi Der, MD;  Location: [facility];  Service: Gastroenterology;;   IR FLUORO GUIDE CV LINE RIGHT  06/24/2022   IR US GUIDE VASC ACCESS RIGHT  06/24/2022   POLYPECTOMY  07/30/2019   Procedure: POLYPECTOMY;  Surgeon: Kathi Der, MD;  Location: [facility];  Service: Gastroenterology;;   POLYPECTOMY  08/02/2019   Procedure: POLYPECTOMY;  Surgeon: Kathi Der, MD;  Location: [facility];  Service: Gastroenterology;;   Social History   Occupational History   Not on file  Tobacco Use   Smoking status: Never   Smokeless tobacco: Never  Vaping Use   Vaping status: Never Used  Substance and Sexual Activity   Alcohol use: No   Drug use: No   Sexual activity: Not Currently    Birth control/protection: None

## 2022-11-09 ENCOUNTER — Telehealth: Payer: Self-pay | Admitting: Registered Nurse

## 2022-11-09 NOTE — Telephone Encounter (Signed)
Patient called in requesting med refill on Dilaudid states she only has 2 pills left , and would like it sent to pharmacy on file

## 2022-11-10 ENCOUNTER — Telehealth: Payer: Self-pay | Admitting: Registered Nurse

## 2022-11-10 ENCOUNTER — Other Ambulatory Visit: Payer: Self-pay

## 2022-11-10 MED ORDER — HYDROMORPHONE HCL 4 MG PO TABS: 4.0000 mg | ORAL_TABLET | Freq: Three times a day (TID) | ORAL | 0 refills | Status: DC | PRN

## 2022-11-10 NOTE — Telephone Encounter (Signed)
Patient states #2 left of Diluadid. Need refill.

## 2022-11-10 NOTE — Telephone Encounter (Signed)
[name] was Reviewed Dilaudid prescription sent to pharmacy.

## 2022-11-16 ENCOUNTER — Ambulatory Visit: Payer: 59 | Admitting: Orthopedic Surgery

## 2022-11-16 IMAGING — CR DG ABDOMEN ACUTE W/ 1V CHEST
5 series · 5 of 5 positions shown · non-contrast
Comparison: Single-view of the chest and CT abdomen and pelvis
09/14/2020.

CLINICAL DATA: Abdominal and chest pain for 3 hours.

EXAM:
DG ABDOMEN ACUTE WITH 1 VIEW CHEST

[x chest ap]
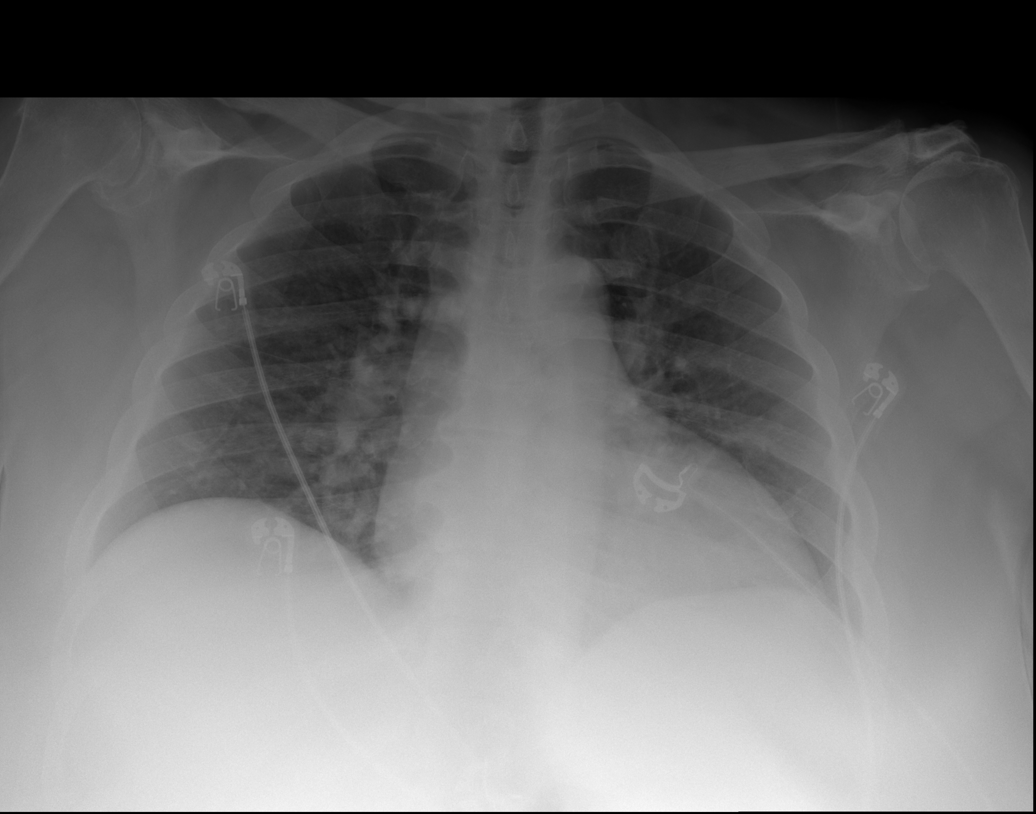

[x abdomen supine (1 of 2)]
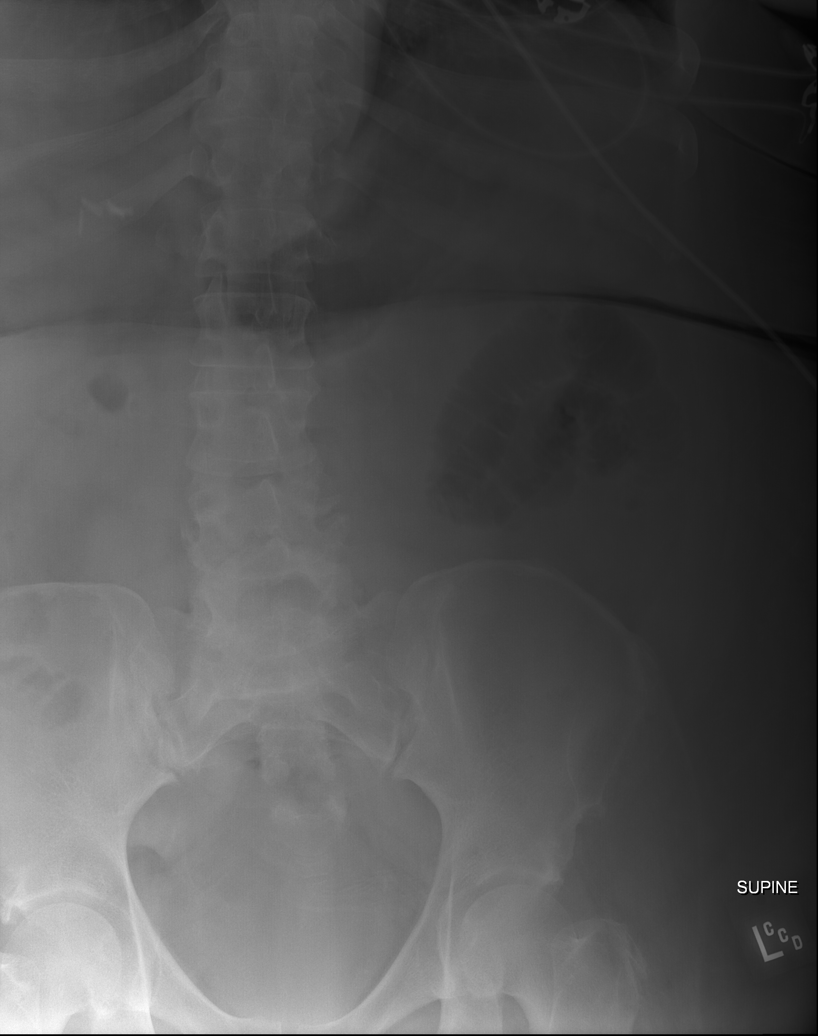

[x abdomen supine (2 of 2)]
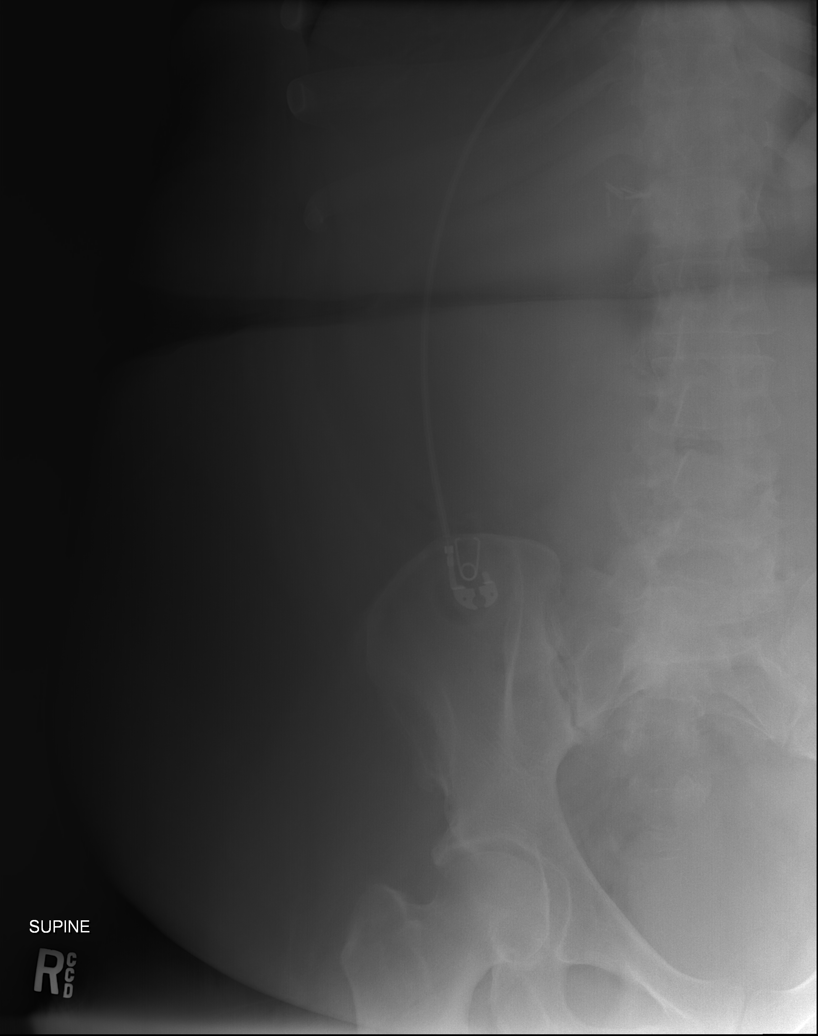

[w abdomen decub (1 of 2)]
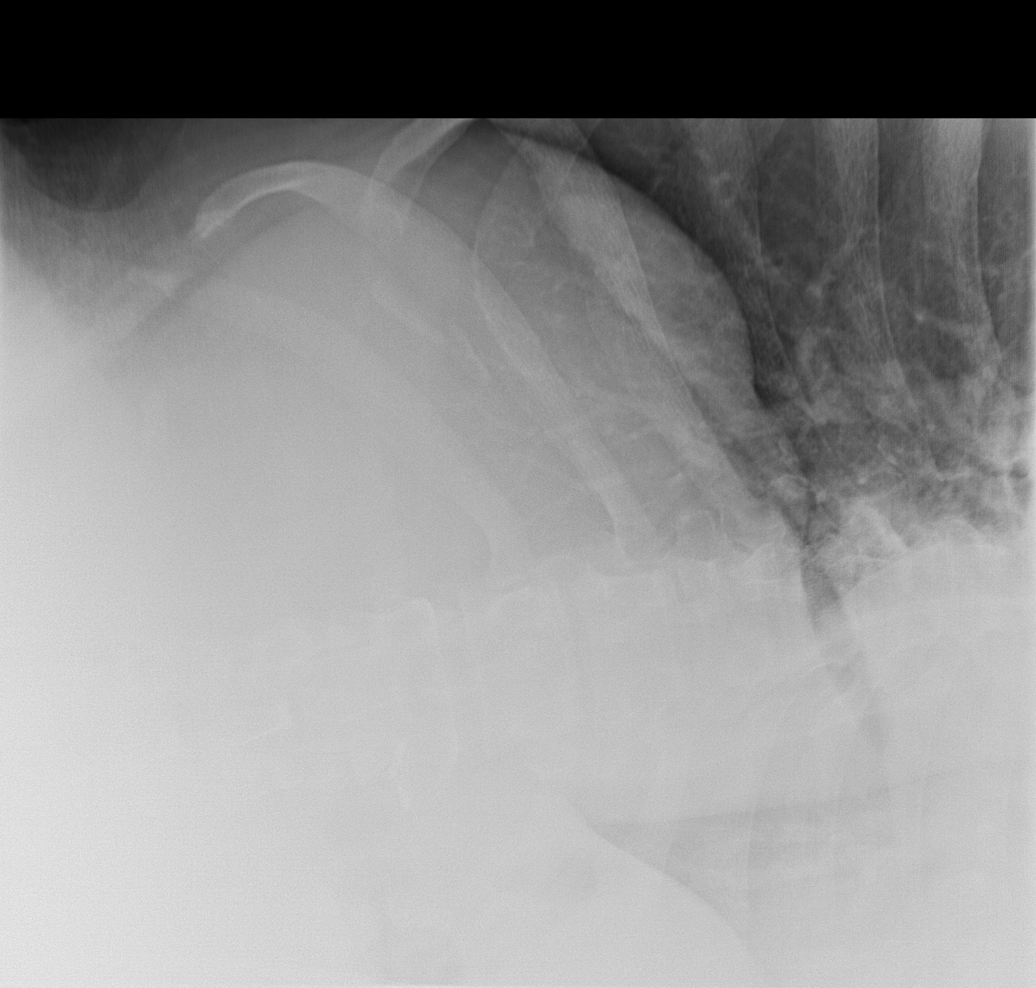

[w abdomen decub (2 of 2)]
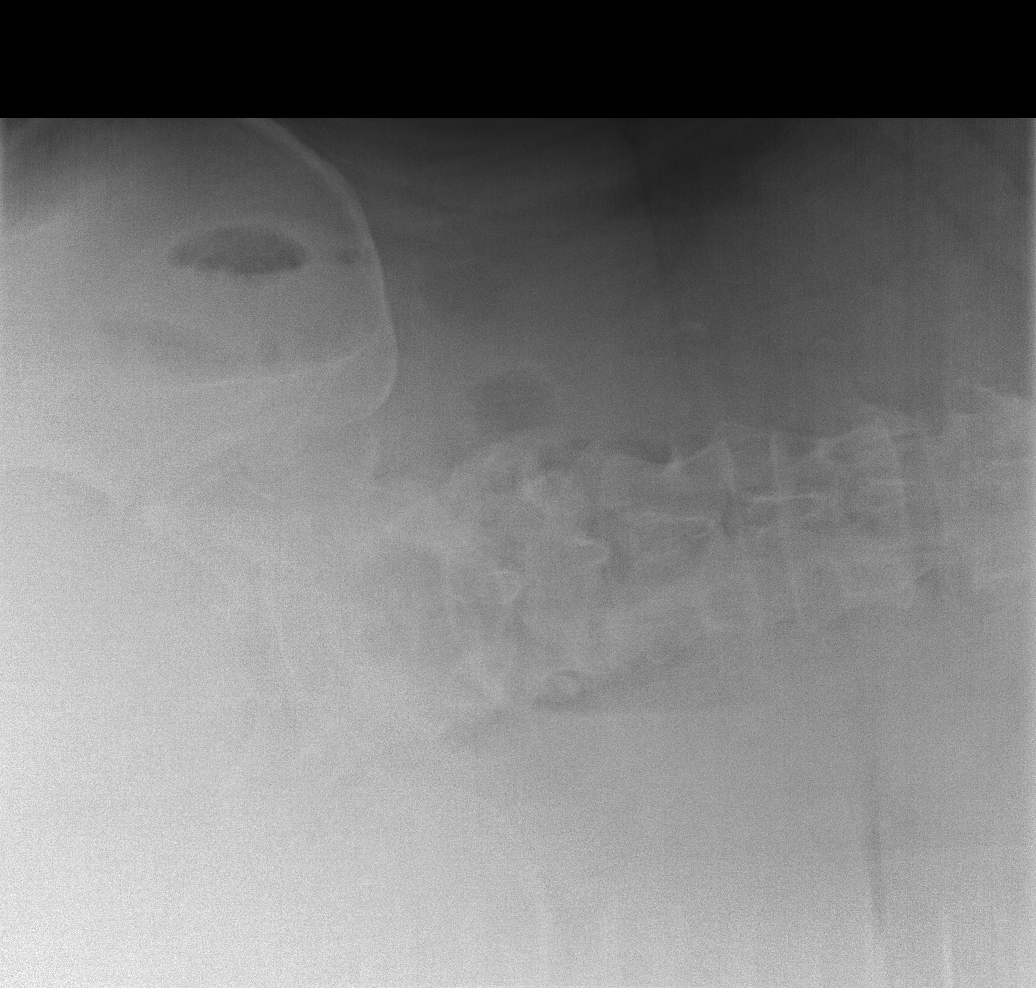

[5 of 5 positions shown; findings below may reference images not displayed]

FINDINGS: Single-view of the chest demonstrates clear lungs. Heart size is
normal. No pneumothorax or pleural fluid. No acute or focal bony
abnormality.

Two views of the abdomen show no free intraperitoneal air. The bowel
gas pattern is nonobstructive. No acute or focal bony abnormality.
IMPRESSION: Negative exam.

## 2022-11-18 ENCOUNTER — Encounter: Payer: Self-pay | Admitting: Registered Nurse

## 2022-11-18 ENCOUNTER — Encounter: Payer: 59 | Attending: Registered Nurse | Admitting: Registered Nurse

## 2022-11-18 VITALS — BP 134/68 | HR 100 | Ht 66.0 in

## 2022-11-18 DIAGNOSIS — M25511 Pain in right shoulder: Secondary | ICD-10-CM | POA: Diagnosis present

## 2022-11-18 DIAGNOSIS — Z5181 Encounter for therapeutic drug level monitoring: Secondary | ICD-10-CM

## 2022-11-18 DIAGNOSIS — G8929 Other chronic pain: Secondary | ICD-10-CM | POA: Diagnosis present

## 2022-11-18 DIAGNOSIS — R000 Tachycardia, unspecified: Secondary | ICD-10-CM

## 2022-11-18 DIAGNOSIS — G893 Neoplasm related pain (acute) (chronic): Secondary | ICD-10-CM | POA: Diagnosis present

## 2022-11-18 DIAGNOSIS — Z79891 Long term (current) use of opiate analgesic: Secondary | ICD-10-CM

## 2022-11-18 DIAGNOSIS — M2550 Pain in unspecified joint: Secondary | ICD-10-CM | POA: Diagnosis present

## 2022-11-18 DIAGNOSIS — M48062 Spinal stenosis, lumbar region with neurogenic claudication: Secondary | ICD-10-CM

## 2022-11-18 DIAGNOSIS — M25551 Pain in right hip: Secondary | ICD-10-CM

## 2022-11-18 DIAGNOSIS — M545 Low back pain, unspecified: Secondary | ICD-10-CM | POA: Diagnosis present

## 2022-11-18 DIAGNOSIS — M546 Pain in thoracic spine: Secondary | ICD-10-CM | POA: Diagnosis present

## 2022-11-18 DIAGNOSIS — M25512 Pain in left shoulder: Secondary | ICD-10-CM | POA: Diagnosis present

## 2022-11-18 DIAGNOSIS — G894 Chronic pain syndrome: Secondary | ICD-10-CM | POA: Diagnosis not present

## 2022-11-18 MED ORDER — HYDROMORPHONE HCL 4 MG PO TABS: 4.0000 mg | ORAL_TABLET | Freq: Three times a day (TID) | ORAL | 0 refills | Status: DC | PRN

## 2022-11-18 NOTE — Progress Notes (Signed)
Subjective:    Patient ID: Deborah [name], female    DOB: 1965/12/28, 57 y.o.   MRN: [id number]  [name] M Furrow is a 57 y.o. female who returns for follow up appointment for chronic pain and medication refill. She states her pain is located in her bilateral shoulders L>R,  lower back pain right hip pain and generalized joint pain. She rates  her pain 10. Her current exercise regime is performing stretching exercises wheelchair bound.  [name] Morphine equivalent is 53.33 MME.   Last ORal Swab was Performed on 07/20/2022, it was consistent.      Pain Inventory Average Pain 9 Pain Right Now 10 My pain is constant, sharp, burning, dull, stabbing, tingling, and aching  In the last 24 hours, has pain interfered with the following? General activity 10 Relation with others 0 Enjoyment of life 10 What TIME of day is your pain at its worst? morning , daytime, evening, and night Sleep (in general) Poor  Pain is worse with: walking, bending, sitting, inactivity, standing, and some activites Pain improves with: medication Relief from Meds: 9  Family History  Problem Relation Age of Onset   Diabetes Mother    Diabetes Father    Heart disease Father    Diabetes Sister    Congestive Heart Failure Sister 63   Diabetes Brother    Social History   Socioeconomic History   Marital status: Married    Spouse name: Not on file   Number of children: Not on file   Years of education: Not on file   Highest education level: Not on file  Occupational History   Not on file  Tobacco Use   Smoking status: Never   Smokeless tobacco: Never  Vaping Use   Vaping status: Never Used  Substance and Sexual Activity   Alcohol use: No   Drug use: No   Sexual activity: Not Currently    Birth control/protection: None  Other Topics Concern   Not on file  Social History Narrative   ** Merged History Encounter **       Social Determinants of Health   Financial Resource  Strain: Low Risk (03/23/2021)   Received from [facility])   Financial Resource Strain  Food Insecurity: No Food Insecurity (09/10/2022)   Hunger Vital Sign    Worried About Running Out of Food in the Last Year: Never true    Ran Out of Food in the Last Year: Never true  Transportation Needs: No Transportation Needs (09/10/2022)   PRAPARE - Administrator, Civil Service (Medical): No    Lack of Transportation (Non-Medical): No  Recent Concern: Transportation Needs - Unmet Transportation Needs (06/19/2022)   PRAPARE - Administrator, Civil Service (Medical): Yes    Lack of Transportation (Non-Medical): Yes  Physical Activity: Not on File (10/31/2017)   Received from [place], [place]   Physical Activity    Physical Activity: 0  Stress: Low Risk (03/23/2021)   [facility])   Stress    Over the last 2 weeks, how often have you been bothered by the following problems: feeling nervous, anxious, on edge?: Not at all    Over the last 2 weeks, how often have you been bothered by the following problems: Not being able to stop or control worrying?: Not at all  Social Connections: Unknown (07/19/2021)   Received from [facility], [facility]   Social Network  Social Network: Not on file   Past Surgical History:  Procedure Laterality Date   AV FISTULA PLACEMENT Left 06/30/2022   Procedure: LEFT BRACHIOCEPHALIC ARTERIOVENOUS (AV) FISTULA CREATION;  Surgeon: Cephus Shelling, MD;  Location: [facility] OR;  Service: Vascular;  Laterality: Left;   BIOPSY  07/27/2019   Procedure: BIOPSY;  Surgeon: Vida Rigger, MD;  Location: [facility]COPY;  Service: Endoscopy;;   BIOPSY  07/30/2019   Procedure: BIOPSY;  Surgeon: Kathi Der, MD;  Location: [facility];  Service: Gastroenterology;;   CATARACT EXTRACTION  01/2014   CHOLECYSTECTOMY     COLONOSCOPY WITH PROPOFOL N/A 07/30/2019   Procedure: COLONOSCOPY  WITH PROPOFOL;  Surgeon: Kathi Der, MD;  Location: [facility];  Service: Gastroenterology;  Laterality: N/A;   ESOPHAGOGASTRODUODENOSCOPY N/A 07/27/2019   Procedure: ESOPHAGOGASTRODUODENOSCOPY (EGD);  Surgeon: Vida Rigger, MD;  Location: [facility];  Service: Endoscopy;  Laterality: N/A;   ESOPHAGOGASTRODUODENOSCOPY N/A 07/26/2020   Procedure: ESOPHAGOGASTRODUODENOSCOPY (EGD);  Surgeon: Willis Modena, MD;  Location: [facility];  Service: Endoscopy;  Laterality: N/A;   ESOPHAGOGASTRODUODENOSCOPY (EGD) WITH PROPOFOL N/A 08/02/2019   Procedure: ESOPHAGOGASTRODUODENOSCOPY (EGD) WITH PROPOFOL;  Surgeon: Kathi Der, MD;  Location: [facility];  Service: Gastroenterology;  Laterality: N/A;   HEMOSTASIS CLIP PLACEMENT  08/02/2019   Procedure: HEMOSTASIS CLIP PLACEMENT;  Surgeon: Kathi Der, MD;  Location: [facility];  Service: Gastroenterology;;   IR FLUORO GUIDE CV LINE RIGHT  06/24/2022   IR US GUIDE VASC ACCESS RIGHT  06/24/2022   POLYPECTOMY  07/30/2019   Procedure: POLYPECTOMY;  Surgeon: Kathi Der, MD;  Location: [facility];  Service: Gastroenterology;;   POLYPECTOMY  08/02/2019   Procedure: POLYPECTOMY;  Surgeon: Kathi Der, MD;  Location: [facility];  Service: Gastroenterology;;   Past Surgical History:  Procedure Laterality Date   AV FISTULA PLACEMENT Left 06/30/2022   Procedure: LEFT BRACHIOCEPHALIC ARTERIOVENOUS (AV) FISTULA CREATION;  Surgeon: Cephus Shelling, MD;  Location[facility] OR;  Service: Vascular;  Laterality: Left;   BIOPSY  07/27/2019   Procedure: BIOPSY;  Surgeon: Vida Rigger, MD;  [facility]: [facility];  Service: Endoscopy;;   BIOPSY  07/30/2019   Procedure: BIOPSY;  Surgeon: Kathi Der, MD;  Location: [facility];  Service: Gastroenterology;;   CATARACT EXTRACTION  01/2014   CHOLECYSTECTOMY     COLONOSCOPY WITH PROPOFOL N/A 07/30/2019   Procedure: COLONOSCOPY WITH PROPOFOL;  Surgeon: Kathi Der, MD;  [facility]: [facility];  Service: Gastroenterology;  Laterality: N/A;   ESOPHAGOGASTRODUODENOSCOPY N/A 07/27/2019   Procedure: ESOPHAGOGASTRODUODENOSCOPY (EGD);  Surgeon: Vida Rigger, MD;  Location: [facility];  Service: Endoscopy;  Laterality: N/A;   ESOPHAGOGASTRODUODENOSCOPY N/A 07/26/2020   Procedure: ESOPHAGOGASTRODUODENOSCOPY (EGD);  Surgeon: Willis Modena, MD;  Location: [facility];  Service: Endoscopy;  Laterality: N/A;   ESOPHAGOGASTRODUODENOSCOPY (EGD) WITH PROPOFOL N/A 08/02/2019   Procedure: ESOPHAGOGASTRODUODENOSCOPY (EGD) WITH PROPOFOL;  Surgeon: Kathi Der, MD;  Location: [facility];  Service: Gastroenterology;  Laterality: N/A;   HEMOSTASIS CLIP PLACEMENT  08/02/2019   Procedure: HEMOSTASIS CLIP PLACEMENT;  Surgeon: Kathi Der, MD;  Location: [facility];  Service: Gastroenterology;;   IR FLUORO GUIDE CV LINE RIGHT  06/24/2022   IR US GUIDE VASC ACCESS RIGHT  06/24/2022   POLYPECTOMY  07/30/2019   Procedure: POLYPECTOMY;  Surgeon: Kathi Der, MD;  Location: [facility];  Service: Gastroenterology;;   POLYPECTOMY  08/02/2019   Procedure: POLYPECTOMY;  Surgeon: Kathi Der, MD;  Location: [facility];  Service: Gastroenterology;;   Past Medical History:  Diagnosis Date   Acute back pain with sciatica,  left    Acute back pain with sciatica, right    AKI (acute kidney injury) (HCC)    Anemia, unspecified    Cancer (HCC)    Carcinoid tumor of duodenum    Chest pain with normal coronary angiography 2019   Chronic kidney disease, stage 3b (HCC)    Chronic pain    Chronic systolic CHF (congestive heart failure) (HCC)    Diabetes mellitus    DKA (diabetic ketoacidosis) (HCC)    Drug-seeking behavior    21 hospitalizations and 14 CT a/p in 2 years for N/V and abdominal pain, demanding only IV dilaudid   Elevated troponin    chronic   Esophageal reflux    Fibromyalgia    Gastric ulcer    Gastroparesis    Gout    Hyperlipidemia    Hypertension     Hypokalemia    Hypomagnesemia    Lumbosacral stenosis    LVH (left ventricular hypertrophy)    Morbid obesity (HCC)    NICM (nonischemic cardiomyopathy) (HCC)    PAF (paroxysmal atrial fibrillation) (HCC)    Stroke (HCC) 02/2011   Thrombocytosis    Vitamin B12 deficiency anemia    BP 134/68   Pulse (!) 109   Ht 5\' 6"  (1.676 m)   LMP 10/10/2012   BMI 47.29 kg/m   Opioid Risk Score:   Fall Risk Score:  `1  Depression screen PHQ 2/9     09/14/2022    1:23 PM 07/20/2022    1:05 PM 09/16/2021    9:16 AM 06/01/2021    1:55 PM 08/01/2020    2:56 PM 06/05/2020    3:01 PM 01/04/2020    2:20 PM  Depression screen PHQ 2/9  Decreased Interest 1 3 3 1 1 1 2   Down, Depressed, Hopeless 1 2 3 1 1 1 3   PHQ - 2 Score 2 5 6 2 2 2 5   Altered sleeping       3  Tired, decreased energy       3  Change in appetite       3  Feeling bad or failure about yourself        3  Trouble concentrating       3  Moving slowly or fidgety/restless       3  Suicidal thoughts       0  PHQ-9 Score       23     Review of Systems  Musculoskeletal:  Positive for back pain and neck pain.       B/L legs feet arm hip pain  All other systems reviewed and are negative.      Objective:   Physical Exam Vitals and nursing note reviewed.  Constitutional:      Appearance: Normal appearance.  Cardiovascular:     Rate and Rhythm: Normal rate and regular rhythm.     Pulses: Normal pulses.     Heart sounds: Normal heart sounds.  Pulmonary:     Effort: Pulmonary effort is normal.     Breath sounds: Normal breath sounds.  Musculoskeletal:     Cervical back: Normal range of motion and neck supple.     Comments: Normal Muscle Bulk and Muscle Testing Reveals:  Upper Extremities: Right Full ROM and Muscle Strength 5/5 Left Upper Extremity: Decreased ROM 20 Degrees and Muscle Strength 5/5 Left AC Joint Tenderness Lumbar Paraspinal Tenderness: L-3-L-5 Right Greater Trochanter Tenderness Lower Extremities: Right:  Decreased ROM and Muscle Strength 5/5 Right  Lower Extremity Flexion Produces Pain into her Right Lower Extremity  Wearing Right Lower Extremity Brace  Left Lower Extremity: Full ROM and Muscle Strength 5/5 Arrived in Wheelchair    Skin:    General: Skin is warm and dry.  Neurological:     Mental Status: She is alert and oriented to person, place, and time.  Psychiatric:        Mood and Affect: Mood normal.        Behavior: Behavior normal.         Assessment & Plan:  1. 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 11/19/2022 2. Chronic Pain Syndrome: Refilled: Dilaudid 4mg  one tablet every 8 hours as needed for pain #80. Second script sent for the following month. We will continue the opioid monitoring program, this consists of regular clinic visits, examinations, urine drug screen, pill counts as well as use of [place] Controlled Substance Reporting system. A 12 month History has been reviewed on the [place] Controlled Substance Reporting System Today.   Continue to Monitor. 11/19/2022 3. Chronic Bilateral Lower Back Pain without sciatica: Continue HEP as Tolerated. Continue to Monitor. 11/19/2022 4. Left Shoulder Pain: RX: Shoulder X-ray. Continue HEP as Tolerated. Continue to Monitor.  5. Right Hip Pain: RX: Hip X-ray .   F/U in 2 months

## 2022-11-22 ENCOUNTER — Inpatient Hospital Stay (HOSPITAL_COMMUNITY)
Admission: EM | Admit: 2022-11-22 | Discharge: 2022-11-30 | DRG: 553 | Disposition: A | Discharge status: Home Health | Payer: 59 | Attending: Internal Medicine | Admitting: Internal Medicine

## 2022-11-22 ENCOUNTER — Other Ambulatory Visit: Payer: Self-pay

## 2022-11-22 DIAGNOSIS — K529 Noninfective gastroenteritis and colitis, unspecified: Secondary | ICD-10-CM | POA: Diagnosis present

## 2022-11-22 DIAGNOSIS — I132 Hypertensive heart and chronic kidney disease with heart failure and with stage 5 chronic kidney disease, or end stage renal disease: Secondary | ICD-10-CM | POA: Diagnosis present

## 2022-11-22 DIAGNOSIS — L89892 Pressure ulcer of other site, stage 2: Secondary | ICD-10-CM | POA: Diagnosis present

## 2022-11-22 DIAGNOSIS — Z8249 Family history of ischemic heart disease and other diseases of the circulatory system: Secondary | ICD-10-CM

## 2022-11-22 DIAGNOSIS — G893 Neoplasm related pain (acute) (chronic): Secondary | ICD-10-CM | POA: Diagnosis present

## 2022-11-22 DIAGNOSIS — E1165 Type 2 diabetes mellitus with hyperglycemia: Secondary | ICD-10-CM | POA: Diagnosis present

## 2022-11-22 DIAGNOSIS — A419 Sepsis, unspecified organism: Principal | ICD-10-CM | POA: Diagnosis present

## 2022-11-22 DIAGNOSIS — L97411 Non-pressure chronic ulcer of right heel and midfoot limited to breakdown of skin: Secondary | ICD-10-CM

## 2022-11-22 DIAGNOSIS — L97419 Non-pressure chronic ulcer of right heel and midfoot with unspecified severity: Secondary | ICD-10-CM | POA: Diagnosis present

## 2022-11-22 DIAGNOSIS — L8990 Pressure ulcer of unspecified site, unspecified stage: Secondary | ICD-10-CM | POA: Insufficient documentation

## 2022-11-22 DIAGNOSIS — D649 Anemia, unspecified: Secondary | ICD-10-CM | POA: Diagnosis present

## 2022-11-22 DIAGNOSIS — M1A9XX0 Chronic gout, unspecified, without tophus (tophi): Secondary | ICD-10-CM | POA: Diagnosis present

## 2022-11-22 DIAGNOSIS — Z888 Allergy status to other drugs, medicaments and biological substances status: Secondary | ICD-10-CM

## 2022-11-22 DIAGNOSIS — R0682 Tachypnea, not elsewhere classified: Secondary | ICD-10-CM | POA: Diagnosis present

## 2022-11-22 DIAGNOSIS — Z6841 Body Mass Index (BMI) 40.0 and over, adult: Secondary | ICD-10-CM

## 2022-11-22 DIAGNOSIS — M109 Gout, unspecified: Principal | ICD-10-CM | POA: Diagnosis present

## 2022-11-22 DIAGNOSIS — Z91041 Radiographic dye allergy status: Secondary | ICD-10-CM

## 2022-11-22 DIAGNOSIS — E871 Hypo-osmolality and hyponatremia: Secondary | ICD-10-CM | POA: Diagnosis present

## 2022-11-22 DIAGNOSIS — F1120 Opioid dependence, uncomplicated: Secondary | ICD-10-CM | POA: Diagnosis present

## 2022-11-22 DIAGNOSIS — I5030 Unspecified diastolic (congestive) heart failure: Secondary | ICD-10-CM | POA: Diagnosis present

## 2022-11-22 DIAGNOSIS — Z993 Dependence on wheelchair: Secondary | ICD-10-CM

## 2022-11-22 DIAGNOSIS — E1122 Type 2 diabetes mellitus with diabetic chronic kidney disease: Secondary | ICD-10-CM | POA: Diagnosis present

## 2022-11-22 DIAGNOSIS — Z885 Allergy status to narcotic agent status: Secondary | ICD-10-CM

## 2022-11-22 DIAGNOSIS — I5A Non-ischemic myocardial injury (non-traumatic): Secondary | ICD-10-CM | POA: Diagnosis present

## 2022-11-22 DIAGNOSIS — Z8674 Personal history of sudden cardiac arrest: Secondary | ICD-10-CM

## 2022-11-22 DIAGNOSIS — I10 Essential (primary) hypertension: Secondary | ICD-10-CM | POA: Diagnosis present

## 2022-11-22 DIAGNOSIS — F32A Depression, unspecified: Secondary | ICD-10-CM | POA: Diagnosis present

## 2022-11-22 DIAGNOSIS — L89619 Pressure ulcer of right heel, unspecified stage: Secondary | ICD-10-CM | POA: Diagnosis present

## 2022-11-22 DIAGNOSIS — M48061 Spinal stenosis, lumbar region without neurogenic claudication: Secondary | ICD-10-CM | POA: Diagnosis present

## 2022-11-22 DIAGNOSIS — Z794 Long term (current) use of insulin: Secondary | ICD-10-CM

## 2022-11-22 DIAGNOSIS — I428 Other cardiomyopathies: Secondary | ICD-10-CM | POA: Diagnosis present

## 2022-11-22 DIAGNOSIS — G629 Polyneuropathy, unspecified: Secondary | ICD-10-CM

## 2022-11-22 DIAGNOSIS — G894 Chronic pain syndrome: Secondary | ICD-10-CM | POA: Diagnosis present

## 2022-11-22 DIAGNOSIS — Z7901 Long term (current) use of anticoagulants: Secondary | ICD-10-CM

## 2022-11-22 DIAGNOSIS — Z880 Allergy status to penicillin: Secondary | ICD-10-CM

## 2022-11-22 DIAGNOSIS — M797 Fibromyalgia: Secondary | ICD-10-CM | POA: Diagnosis present

## 2022-11-22 DIAGNOSIS — Z85068 Personal history of other malignant neoplasm of small intestine: Secondary | ICD-10-CM

## 2022-11-22 DIAGNOSIS — Z9049 Acquired absence of other specified parts of digestive tract: Secondary | ICD-10-CM

## 2022-11-22 DIAGNOSIS — N2589 Other disorders resulting from impaired renal tubular function: Secondary | ICD-10-CM | POA: Diagnosis present

## 2022-11-22 DIAGNOSIS — Z23 Encounter for immunization: Secondary | ICD-10-CM

## 2022-11-22 DIAGNOSIS — E872 Acidosis, unspecified: Secondary | ICD-10-CM | POA: Diagnosis present

## 2022-11-22 DIAGNOSIS — E11621 Type 2 diabetes mellitus with foot ulcer: Secondary | ICD-10-CM | POA: Diagnosis present

## 2022-11-22 DIAGNOSIS — I5032 Chronic diastolic (congestive) heart failure: Secondary | ICD-10-CM | POA: Diagnosis present

## 2022-11-22 DIAGNOSIS — Z8711 Personal history of peptic ulcer disease: Secondary | ICD-10-CM

## 2022-11-22 DIAGNOSIS — E1143 Type 2 diabetes mellitus with diabetic autonomic (poly)neuropathy: Secondary | ICD-10-CM | POA: Diagnosis present

## 2022-11-22 DIAGNOSIS — Z833 Family history of diabetes mellitus: Secondary | ICD-10-CM

## 2022-11-22 DIAGNOSIS — K3184 Gastroparesis: Secondary | ICD-10-CM | POA: Diagnosis present

## 2022-11-22 DIAGNOSIS — Z992 Dependence on renal dialysis: Secondary | ICD-10-CM

## 2022-11-22 DIAGNOSIS — E1151 Type 2 diabetes mellitus with diabetic peripheral angiopathy without gangrene: Secondary | ICD-10-CM | POA: Diagnosis present

## 2022-11-22 DIAGNOSIS — M898X9 Other specified disorders of bone, unspecified site: Secondary | ICD-10-CM | POA: Diagnosis present

## 2022-11-22 DIAGNOSIS — D6489 Other specified anemias: Secondary | ICD-10-CM | POA: Diagnosis present

## 2022-11-22 DIAGNOSIS — K219 Gastro-esophageal reflux disease without esophagitis: Secondary | ICD-10-CM | POA: Diagnosis present

## 2022-11-22 DIAGNOSIS — E1140 Type 2 diabetes mellitus with diabetic neuropathy, unspecified: Secondary | ICD-10-CM | POA: Diagnosis present

## 2022-11-22 DIAGNOSIS — E861 Hypovolemia: Secondary | ICD-10-CM | POA: Diagnosis present

## 2022-11-22 DIAGNOSIS — E340 Carcinoid syndrome: Secondary | ICD-10-CM | POA: Diagnosis present

## 2022-11-22 DIAGNOSIS — I2489 Other forms of acute ischemic heart disease: Secondary | ICD-10-CM | POA: Diagnosis present

## 2022-11-22 DIAGNOSIS — N186 End stage renal disease: Secondary | ICD-10-CM

## 2022-11-22 DIAGNOSIS — L97319 Non-pressure chronic ulcer of right ankle with unspecified severity: Secondary | ICD-10-CM | POA: Diagnosis present

## 2022-11-22 DIAGNOSIS — Z8673 Personal history of transient ischemic attack (TIA), and cerebral infarction without residual deficits: Secondary | ICD-10-CM

## 2022-11-22 DIAGNOSIS — Z91119 Patient's noncompliance with dietary regimen due to unspecified reason: Secondary | ICD-10-CM

## 2022-11-22 DIAGNOSIS — Z1152 Encounter for screening for COVID-19: Secondary | ICD-10-CM

## 2022-11-22 DIAGNOSIS — E785 Hyperlipidemia, unspecified: Secondary | ICD-10-CM | POA: Diagnosis present

## 2022-11-22 DIAGNOSIS — Z79899 Other long term (current) drug therapy: Secondary | ICD-10-CM

## 2022-11-22 DIAGNOSIS — I480 Paroxysmal atrial fibrillation: Secondary | ICD-10-CM | POA: Diagnosis present

## 2022-11-22 DIAGNOSIS — I959 Hypotension, unspecified: Secondary | ICD-10-CM | POA: Diagnosis present

## 2022-11-22 DIAGNOSIS — L89226 Pressure-induced deep tissue damage of left hip: Secondary | ICD-10-CM | POA: Diagnosis present

## 2022-11-22 DIAGNOSIS — D631 Anemia in chronic kidney disease: Secondary | ICD-10-CM | POA: Diagnosis present

## 2022-11-22 DIAGNOSIS — K760 Fatty (change of) liver, not elsewhere classified: Secondary | ICD-10-CM | POA: Diagnosis present

## 2022-11-22 DIAGNOSIS — R571 Hypovolemic shock: Secondary | ICD-10-CM | POA: Diagnosis present

## 2022-11-22 NOTE — ED Triage Notes (Signed)
Pt is a 57 yo chronically ill comes in with weakness via EMS. She missed dialysis today. She was was found to have low BP 100/50 with EMS, as well as a high blood glucose of 503. She also has multiple ulcers on her backside from lack of mobility.

## 2022-11-22 NOTE — ED Provider Notes (Signed)
[facility]-EMERGENCY DEPT [facility] Emergency Department Provider Note MRN:  [id number]  Arrival date & time: 11/23/22     Chief Complaint   Weakness   History of Present Illness   Deborah [name] is a 57 y.o. year-old female presents to the ED with chief complaint of generalized weakness.  States that she missed dialysis today.  Last dialysis was on Friday.  She reports new rash and developing sacral ulcer.  States that her blood sugar has been high.  She also reports an ulcer on her right heel.  She states that she feels fatigued and short of breath.  She denies fever.  History provided by patient.   Review of Systems  Pertinent positive and negative review of systems noted in HPI.    Physical Exam   Vitals:   11/23/22 0215 11/23/22 0245  BP: (!) 101/56 95/68  Pulse: 86 85  Resp: (!) 28 20  Temp:    SpO2: 99% 100%    CONSTITUTIONAL:  well-appearing, NAD NEURO:  Alert and oriented x 3, CN 3-12 grossly intact EYES:  eyes equal and reactive ENT/NECK:  Supple, no stridor  CARDIO:  normal rate, regular rhythm, appears well-perfused  PULM:  No respiratory distress, diminished  GI/GU:  non-distended, developing sacral ulcers MSK/SPINE:  No gross deformities, no edema, moves all extremities  SKIN:  no rash, atraumatic, foul smelling developing sacral ulcers with some active bleeing and wheeping, right heel ulcer appears dry   *Additional and/or pertinent findings included in MDM below  Diagnostic and Interventional Summary    EKG Interpretation Date/Time:    Ventricular Rate:    PR Interval:    QRS Duration:    QT Interval:    QTC Calculation:   R Axis:      Text Interpretation:         Labs Reviewed  COMPREHENSIVE METABOLIC PANEL - Abnormal; Notable for the following components:      Result Value   Sodium 127 (*)    Chloride 86 (*)    CO2 16 (*)    Glucose, Bld 270 (*)    BUN 68 (*)    Creatinine, Ser 10.14 (*)    Albumin 2.2 (*)    AST 47 (*)     Total Bilirubin 1.3 (*)    GFR, Estimated 4 (*)    Anion gap 25 (*)    All other components within normal limits  CBC WITH DIFFERENTIAL/PLATELET - Abnormal; Notable for the following components:   WBC 30.3 (*)    RBC 2.90 (*)    Hemoglobin 7.7 (*)    HCT 26.6 (*)    MCHC 28.9 (*)    RDW 16.7 (*)    Neutro Abs 27.1 (*)    Abs Immature Granulocytes 0.48 (*)    All other components within normal limits  PROTIME-INR - Abnormal; Notable for the following components:   Prothrombin Time 20.9 (*)    INR 1.8 (*)    All other components within normal limits  APTT - Abnormal; Notable for the following components:   aPTT 44 (*)    All other components within normal limits  BRAIN NATRIURETIC PEPTIDE - Abnormal; Notable for the following components:   B Natriuretic Peptide 120.4 (*)    All other components within normal limits  I-STAT CG4 LACTIC ACID, ED - Abnormal; Notable for the following components:   Lactic Acid, Venous 4.4 (*)    All other components within normal limits  I-STAT CG4 LACTIC ACID, ED -  Abnormal; Notable for the following components:   Lactic Acid, Venous 3.8 (*)    All other components within normal limits  I-STAT ARTERIAL BLOOD GAS, ED - Abnormal; Notable for the following components:   pCO2 arterial 29.9 (*)    Bicarbonate 19.6 (*)    TCO2 21 (*)    Acid-base deficit 5.0 (*)    Sodium 127 (*)    HCT 23.0 (*)    Hemoglobin 7.8 (*)    All other components within normal limits  TROPONIN I (HIGH SENSITIVITY) - Abnormal; Notable for the following components:   Troponin I (High Sensitivity) 20 (*)    All other components within normal limits  RESP PANEL BY RT-PCR (RSV, FLU A&B, COVID)  [id number]  CULTURE, BLOOD (ROUTINE X 2)  CULTURE, BLOOD (ROUTINE X 2)  URINALYSIS, W/ REFLEX TO CULTURE (INFECTION SUSPECTED)  TROPONIN I (HIGH SENSITIVITY)    US Abdomen Limited RUQ (LIVER/GB)  Final Result    DG Chest Port 1 View  Final Result      Medications  lactated ringers  infusion ( Intravenous New Bag/Given 11/23/22 0125)  vancomycin (VANCOCIN) 2,500 mg in sodium chloride 0.9 % 500 mL IVPB (2,500 mg Intravenous New Bag/Given 11/23/22 0231)  lactated ringers bolus 1,000 mL (0 mLs Intravenous Stopped 11/23/22 0204)  metroNIDAZOLE (FLAGYL) IVPB 500 mg (0 mg Intravenous Stopped 11/23/22 0231)  ceFEPIme (MAXIPIME) 2 g in sodium chloride 0.9 % 100 mL IVPB (0 g Intravenous Stopped 11/23/22 0204)     Procedures  /  Critical Care .Critical Care  Performed by: Roxy Horseman, PA-C Authorized by: Roxy Horseman, PA-C   Critical care provider statement:    Critical care time (minutes):  55   Critical care was necessary to treat or prevent imminent or life-threatening deterioration of the following conditions:  Sepsis   Critical care was time spent personally by me on the following activities:  Development of treatment plan with patient or surrogate, discussions with consultants, evaluation of patient's response to treatment, examination of patient, ordering and review of laboratory studies, ordering and review of radiographic studies, ordering and performing treatments and interventions, pulse oximetry, re-evaluation of patient's condition and review of old charts   ED Course and Medical Decision Making  I have reviewed the triage vital signs, the nursing notes, and pertinent available records from the EMR.  Social Determinants Affecting Complexity of Care: Patient has no clinically significant social determinants affecting this chief complaint..   ED Course: Clinical Course as of 11/23/22 0330  Tue Nov 23, 2022  0327 Lactic Acid, Venous(!!): 3.8 Concern for sepsis, code sepsis activated, starting broad spectrum antibiotics [RB]  0328 WBC(!): 30.3 Leukocytosis, worrisome for infection, possible source of sacral ulcers [RB]  0328 BUN(!): 68 Discussed uremia with nephro, who recommends no emergent dialysis [RB]    Clinical Course User Index [RB] Roxy Horseman, PA-C    Medical Decision Making Patient here with weakness.  Was feeling more weak today than normal.  Also has new sacral wounds/ulcers.  Workup initiated.  Amount and/or Complexity of Data Reviewed Labs: ordered. Decision-making details documented in ED Course. Radiology: ordered and independent interpretation performed. Decision-making details documented in ED Course.    Details: No large effusion ECG/medicine tests: ordered.  Risk Prescription drug management. Decision regarding hospitalization.         Consultants: I consulted with Dr. Lazarus Salines, from [facility], who requests PCCM consult and nephro consult along with ABG, RUQ [place].  I consulted with Dr. Everardo All, from [facility],  who agrees with plan to consult nephrology to inquire about emergent dialysis.  I consulted with Dr. Juel Burrow, from nephro, who says no to emergent dialysis, he will see the patient in the morning.   Treatment and Plan: Patient's exam and diagnostic results are concerning for sepsis.  Feel that patient will need admission to the hospital for further treatment and evaluation.  Patient discussed with attending physician, Dr. Nicanor Alcon, who agrees with plan.  Recommends caution with fluids, tells me to give a liter of LR due to decreased EF and having missed recent dialysis.  Final Clinical Impressions(s) / ED Diagnoses     ICD-10-CM   1. Sepsis, due to unspecified organism, unspecified whether acute organ dysfunction present [facility])  A41.9       ED Discharge Orders     None         Discharge Instructions Discussed with and Provided to Patient:   Discharge Instructions   None      Roxy Horseman, PA-C 11/23/22 0330    Palumbo, April, MD 11/23/22 [phone]

## 2022-11-23 ENCOUNTER — Emergency Department (HOSPITAL_COMMUNITY): Payer: 59

## 2022-11-23 DIAGNOSIS — A419 Sepsis, unspecified organism: Secondary | ICD-10-CM | POA: Diagnosis present

## 2022-11-23 DIAGNOSIS — Z23 Encounter for immunization: Secondary | ICD-10-CM | POA: Diagnosis present

## 2022-11-23 DIAGNOSIS — I2489 Other forms of acute ischemic heart disease: Secondary | ICD-10-CM | POA: Diagnosis present

## 2022-11-23 DIAGNOSIS — M109 Gout, unspecified: Secondary | ICD-10-CM | POA: Diagnosis present

## 2022-11-23 DIAGNOSIS — N186 End stage renal disease: Secondary | ICD-10-CM | POA: Diagnosis present

## 2022-11-23 DIAGNOSIS — D631 Anemia in chronic kidney disease: Secondary | ICD-10-CM | POA: Diagnosis present

## 2022-11-23 DIAGNOSIS — L97319 Non-pressure chronic ulcer of right ankle with unspecified severity: Secondary | ICD-10-CM | POA: Diagnosis present

## 2022-11-23 DIAGNOSIS — F1120 Opioid dependence, uncomplicated: Secondary | ICD-10-CM | POA: Diagnosis present

## 2022-11-23 DIAGNOSIS — L89619 Pressure ulcer of right heel, unspecified stage: Secondary | ICD-10-CM | POA: Diagnosis not present

## 2022-11-23 DIAGNOSIS — Z992 Dependence on renal dialysis: Secondary | ICD-10-CM | POA: Diagnosis not present

## 2022-11-23 DIAGNOSIS — E340 Carcinoid syndrome: Secondary | ICD-10-CM | POA: Diagnosis present

## 2022-11-23 DIAGNOSIS — E1165 Type 2 diabetes mellitus with hyperglycemia: Secondary | ICD-10-CM | POA: Diagnosis present

## 2022-11-23 DIAGNOSIS — M6289 Other specified disorders of muscle: Secondary | ICD-10-CM | POA: Diagnosis not present

## 2022-11-23 DIAGNOSIS — E871 Hypo-osmolality and hyponatremia: Secondary | ICD-10-CM | POA: Diagnosis present

## 2022-11-23 DIAGNOSIS — E872 Acidosis, unspecified: Secondary | ICD-10-CM | POA: Diagnosis present

## 2022-11-23 DIAGNOSIS — L97419 Non-pressure chronic ulcer of right heel and midfoot with unspecified severity: Secondary | ICD-10-CM | POA: Diagnosis present

## 2022-11-23 DIAGNOSIS — R571 Hypovolemic shock: Secondary | ICD-10-CM | POA: Diagnosis present

## 2022-11-23 DIAGNOSIS — L97411 Non-pressure chronic ulcer of right heel and midfoot limited to breakdown of skin: Secondary | ICD-10-CM | POA: Diagnosis not present

## 2022-11-23 DIAGNOSIS — Z6841 Body Mass Index (BMI) 40.0 and over, adult: Secondary | ICD-10-CM | POA: Diagnosis not present

## 2022-11-23 DIAGNOSIS — K760 Fatty (change of) liver, not elsewhere classified: Secondary | ICD-10-CM | POA: Diagnosis present

## 2022-11-23 DIAGNOSIS — E1151 Type 2 diabetes mellitus with diabetic peripheral angiopathy without gangrene: Secondary | ICD-10-CM | POA: Diagnosis not present

## 2022-11-23 DIAGNOSIS — I959 Hypotension, unspecified: Secondary | ICD-10-CM | POA: Diagnosis present

## 2022-11-23 DIAGNOSIS — I10 Essential (primary) hypertension: Secondary | ICD-10-CM | POA: Diagnosis not present

## 2022-11-23 DIAGNOSIS — Z1152 Encounter for screening for COVID-19: Secondary | ICD-10-CM | POA: Diagnosis not present

## 2022-11-23 DIAGNOSIS — I428 Other cardiomyopathies: Secondary | ICD-10-CM | POA: Diagnosis present

## 2022-11-23 DIAGNOSIS — I132 Hypertensive heart and chronic kidney disease with heart failure and with stage 5 chronic kidney disease, or end stage renal disease: Secondary | ICD-10-CM | POA: Diagnosis present

## 2022-11-23 DIAGNOSIS — F32A Depression, unspecified: Secondary | ICD-10-CM | POA: Diagnosis present

## 2022-11-23 DIAGNOSIS — I5032 Chronic diastolic (congestive) heart failure: Secondary | ICD-10-CM | POA: Diagnosis present

## 2022-11-23 DIAGNOSIS — E1143 Type 2 diabetes mellitus with diabetic autonomic (poly)neuropathy: Secondary | ICD-10-CM | POA: Diagnosis not present

## 2022-11-23 HISTORY — DX: Sepsis, unspecified organism: A41.9

## 2022-11-23 LAB — GLUCOSE, CAPILLARY: Glucose-Capillary: 96 mg/dL (ref 70–99)

## 2022-11-23 LAB — COMPREHENSIVE METABOLIC PANEL
ALT: 15 U/L (ref 0–44)
AST: 47 U/L — ABNORMAL HIGH (ref 15–41)
Albumin: 2.2 g/dL — ABNORMAL LOW (ref 3.5–5.0)
Alkaline Phosphatase: 86 U/L (ref 38–126)
Anion gap: 25 — ABNORMAL HIGH (ref 5–15)
BUN: 68 mg/dL — ABNORMAL HIGH (ref 6–20)
CO2: 16 mmol/L — ABNORMAL LOW (ref 22–32)
Calcium: 9.3 mg/dL (ref 8.9–10.3)
Chloride: 86 mmol/L — ABNORMAL LOW (ref 98–111)
Creatinine, Ser: 10.14 mg/dL — ABNORMAL HIGH (ref 0.44–1.00)
GFR, Estimated: 4 mL/min — ABNORMAL LOW (ref >60)
Glucose, Bld: 270 mg/dL — ABNORMAL HIGH (ref 70–99)
Potassium: 4 mmol/L (ref 3.5–5.1)
Sodium: 127 mmol/L — ABNORMAL LOW (ref 135–145)
Total Bilirubin: 1.3 mg/dL — ABNORMAL HIGH (ref 0.3–1.2)
Total Protein: 7.8 g/dL (ref 6.5–8.1)

## 2022-11-23 LAB — RESP PANEL BY RT-PCR (RSV, FLU A&B, COVID)  RVPGX2
Influenza A by PCR: NEGATIVE
Influenza B by PCR: NEGATIVE
Resp Syncytial Virus by PCR: NEGATIVE
SARS Coronavirus 2 by RT PCR: NEGATIVE

## 2022-11-23 LAB — CBC WITH DIFFERENTIAL/PLATELET
Abs Immature Granulocytes: 0.48 10*3/uL — ABNORMAL HIGH (ref 0.00–0.07)
Basophils Absolute: 0.1 10*3/uL (ref 0.0–0.1)
Basophils Relative: 0 %
Eosinophils Absolute: 0.1 10*3/uL (ref 0.0–0.5)
Eosinophils Relative: 0 %
HCT: 26.6 % — ABNORMAL LOW (ref 36.0–46.0)
Hemoglobin: 7.7 g/dL — ABNORMAL LOW (ref 12.0–15.0)
Immature Granulocytes: 2 %
Lymphocytes Relative: 6 %
Lymphs Abs: 1.8 10*3/uL (ref 0.7–4.0)
MCH: 26.6 pg (ref 26.0–34.0)
MCHC: 28.9 g/dL — ABNORMAL LOW (ref 30.0–36.0)
MCV: 91.7 fL (ref 80.0–100.0)
Monocytes Absolute: 0.7 10*3/uL (ref 0.1–1.0)
Monocytes Relative: 2 %
Neutro Abs: 27.1 10*3/uL — ABNORMAL HIGH (ref 1.7–7.7)
Neutrophils Relative %: 90 %
Platelets: 345 10*3/uL (ref 150–400)
RBC: 2.9 MIL/uL — ABNORMAL LOW (ref 3.87–5.11)
RDW: 16.7 % — ABNORMAL HIGH (ref 11.5–15.5)
WBC: 30.3 10*3/uL — ABNORMAL HIGH (ref 4.0–10.5)
nRBC: 0 % (ref 0.0–0.2)

## 2022-11-23 LAB — BRAIN NATRIURETIC PEPTIDE: B Natriuretic Peptide: 120.4 pg/mL — ABNORMAL HIGH (ref 0.0–100.0)

## 2022-11-23 LAB — I-STAT ARTERIAL BLOOD GAS, ED
Acid-base deficit: 5 mmol/L — ABNORMAL HIGH (ref 0.0–2.0)
Bicarbonate: 19.6 mmol/L — ABNORMAL LOW (ref 20.0–28.0)
Calcium, Ion: 1.17 mmol/L (ref 1.15–1.40)
HCT: 23 % — ABNORMAL LOW (ref 36.0–46.0)
Hemoglobin: 7.8 g/dL — ABNORMAL LOW (ref 12.0–15.0)
O2 Saturation: 98 %
Patient temperature: 97.2
Potassium: 3.7 mmol/L (ref 3.5–5.1)
Sodium: 127 mmol/L — ABNORMAL LOW (ref 135–145)
TCO2: 21 mmol/L — ABNORMAL LOW (ref 22–32)
pCO2 arterial: 29.9 mmHg — ABNORMAL LOW (ref 32–48)
pH, Arterial: 7.42 (ref 7.35–7.45)
pO2, Arterial: 96 mmHg (ref 83–108)

## 2022-11-23 LAB — HEPATITIS B SURFACE ANTIGEN: Hepatitis B Surface Ag: NONREACTIVE

## 2022-11-23 LAB — CBG MONITORING, ED
Glucose-Capillary: 120 mg/dL — ABNORMAL HIGH (ref 70–99)
Glucose-Capillary: 136 mg/dL — ABNORMAL HIGH (ref 70–99)
Glucose-Capillary: 127 mg/dL — ABNORMAL HIGH (ref 70–99)
Glucose-Capillary: 117 mg/dL — ABNORMAL HIGH (ref 70–99)

## 2022-11-23 LAB — I-STAT CG4 LACTIC ACID, ED
Lactic Acid, Venous: 3.8 mmol/L (ref 0.5–1.9)
Lactic Acid, Venous: 4.4 mmol/L (ref 0.5–1.9)

## 2022-11-23 LAB — PROTIME-INR
INR: 1.8 — ABNORMAL HIGH (ref 0.8–1.2)
Prothrombin Time: 20.9 s — ABNORMAL HIGH (ref 11.4–15.2)

## 2022-11-23 LAB — TROPONIN I (HIGH SENSITIVITY)
Troponin I (High Sensitivity): 20 ng/L — ABNORMAL HIGH (ref <18)
Troponin I (High Sensitivity): 16 ng/L (ref <18)

## 2022-11-23 LAB — PHOSPHORUS: Phosphorus: 8.4 mg/dL — ABNORMAL HIGH (ref 2.5–4.6)

## 2022-11-23 LAB — OSMOLALITY: Osmolality: 291 mosm/kg (ref 275–295)

## 2022-11-23 LAB — LACTIC ACID, PLASMA
Lactic Acid, Venous: 1.7 mmol/L (ref 0.5–1.9)
Lactic Acid, Venous: 1 mmol/L (ref 0.5–1.9)

## 2022-11-23 LAB — APTT: aPTT: 44 s — ABNORMAL HIGH (ref 24–36)

## 2022-11-23 MED ORDER — ANTICOAGULANT SODIUM CITRATE 4% (200MG/5ML) IV SOLN: 5.0000 mL | Status: DC | PRN

## 2022-11-23 MED ORDER — LACTATED RINGERS IV BOLUS
500.0000 mL | Freq: Once | INTRAVENOUS | Status: AC
  Administered 2022-11-23: 500 mL via INTRAVENOUS

## 2022-11-23 MED ORDER — PENTAFLUOROPROP-TETRAFLUOROETH EX AERO: 1.0000 | INHALATION_SPRAY | CUTANEOUS | Status: DC | PRN

## 2022-11-23 MED ORDER — DULOXETINE HCL 20 MG PO CPEP
40.0000 mg | ORAL_CAPSULE | Freq: Every day | ORAL | Status: DC
  Administered 2022-11-23 – 2022-11-30 (×7): 40 mg via ORAL
  Filled 2022-11-23 (×8): qty 2

## 2022-11-23 MED ORDER — ALTEPLASE 2 MG IJ SOLR: 2.0000 mg | Freq: Once | INTRAMUSCULAR | Status: DC | PRN

## 2022-11-23 MED ORDER — LIDOCAINE HCL (PF) 1 % IJ SOLN: 5.0000 mL | INTRAMUSCULAR | Status: DC | PRN

## 2022-11-23 MED ORDER — MIRTAZAPINE 15 MG PO TBDP
15.0000 mg | ORAL_TABLET | Freq: Every day | ORAL | Status: DC
  Administered 2022-11-23 – 2022-11-30 (×7): 15 mg via ORAL
  Filled 2022-11-23 (×10): qty 1

## 2022-11-23 MED ORDER — VANCOMYCIN VARIABLE DOSE PER UNSTABLE RENAL FUNCTION (PHARMACIST DOSING): Status: DC

## 2022-11-23 MED ORDER — NEPRO/CARBSTEADY PO LIQD: 237.0000 mL | ORAL | Status: DC | PRN

## 2022-11-23 MED ORDER — MIDODRINE HCL 5 MG PO TABS
10.0000 mg | ORAL_TABLET | ORAL | Status: AC | PRN
  Administered 2022-11-23: 10 mg via ORAL
  Filled 2022-11-23: qty 2

## 2022-11-23 MED ORDER — DOXERCALCIFEROL 4 MCG/2ML IV SOLN
4.0000 ug | INTRAVENOUS | Status: DC
  Administered 2022-11-24: 4 ug via INTRAVENOUS
  Filled 2022-11-23 (×4): qty 2

## 2022-11-23 MED ORDER — VANCOMYCIN HCL IN DEXTROSE 1-5 GM/200ML-% IV SOLN: 1000.0000 mg | Freq: Once | INTRAVENOUS | Status: DC

## 2022-11-23 MED ORDER — INSULIN ASPART 100 UNIT/ML IJ SOLN
0.0000 [IU] | Freq: Three times a day (TID) | INTRAMUSCULAR | Status: DC
  Administered 2022-11-23 – 2022-11-28 (×4): 2 [IU] via SUBCUTANEOUS
  Administered 2022-11-28: 3 [IU] via SUBCUTANEOUS
  Administered 2022-11-29: 5 [IU] via SUBCUTANEOUS
  Administered 2022-11-29 – 2022-11-30 (×3): 2 [IU] via SUBCUTANEOUS

## 2022-11-23 MED ORDER — HEPARIN SODIUM (PORCINE) 1000 UNIT/ML IJ SOLN
3800.0000 [IU] | Freq: Once | INTRAMUSCULAR | Status: AC
  Administered 2022-11-23: 3800 [IU]
  Filled 2022-11-23: qty 4

## 2022-11-23 MED ORDER — ONDANSETRON HCL 4 MG/2ML IJ SOLN
4.0000 mg | Freq: Four times a day (QID) | INTRAMUSCULAR | Status: DC | PRN
  Administered 2022-11-24 – 2022-11-25 (×3): 4 mg via INTRAVENOUS
  Filled 2022-11-23 (×3): qty 2

## 2022-11-23 MED ORDER — ALBUTEROL SULFATE (2.5 MG/3ML) 0.083% IN NEBU: 2.5000 mg | INHALATION_SOLUTION | RESPIRATORY_TRACT | Status: DC | PRN

## 2022-11-23 MED ORDER — APIXABAN 5 MG PO TABS
5.0000 mg | ORAL_TABLET | Freq: Two times a day (BID) | ORAL | Status: DC
  Administered 2022-11-23 – 2022-11-24 (×3): 5 mg via ORAL
  Filled 2022-11-23: qty 1
  Filled 2022-11-23: qty 2
  Filled 2022-11-23: qty 1

## 2022-11-23 MED ORDER — HEPARIN SODIUM (PORCINE) 1000 UNIT/ML DIALYSIS: 1000.0000 [IU] | INTRAMUSCULAR | Status: DC | PRN

## 2022-11-23 MED ORDER — HYDROMORPHONE HCL 2 MG PO TABS
4.0000 mg | ORAL_TABLET | Freq: Three times a day (TID) | ORAL | Status: DC | PRN
  Administered 2022-11-24 – 2022-11-30 (×14): 4 mg via ORAL
  Filled 2022-11-23 (×15): qty 2

## 2022-11-23 MED ORDER — LACTATED RINGERS IV SOLN: INTRAVENOUS | Status: DC

## 2022-11-23 MED ORDER — SODIUM CHLORIDE 0.9 % IV SOLN
2.0000 g | Freq: Once | INTRAVENOUS | Status: DC
  Filled 2022-11-23: qty 10

## 2022-11-23 MED ORDER — HYDROMORPHONE HCL 2 MG PO TABS
2.0000 mg | ORAL_TABLET | Freq: Three times a day (TID) | ORAL | Status: DC | PRN
  Administered 2022-11-23: 2 mg via ORAL
  Filled 2022-11-23: qty 1

## 2022-11-23 MED ORDER — SODIUM CHLORIDE 0.9% FLUSH
3.0000 mL | Freq: Two times a day (BID) | INTRAVENOUS | Status: DC
  Administered 2022-11-23 – 2022-11-29 (×10): 3 mL via INTRAVENOUS

## 2022-11-23 MED ORDER — INFLUENZA VIRUS VACC SPLIT PF (FLUZONE) 0.5 ML IM SUSY
0.5000 mL | PREFILLED_SYRINGE | INTRAMUSCULAR | Status: AC
  Administered 2022-11-25: 0.5 mL via INTRAMUSCULAR
  Filled 2022-11-23: qty 0.5

## 2022-11-23 MED ORDER — INSULIN GLARGINE-YFGN 100 UNIT/ML ~~LOC~~ SOLN
40.0000 [IU] | Freq: Every day | SUBCUTANEOUS | Status: DC
  Administered 2022-11-23 – 2022-11-24 (×2): 40 [IU] via SUBCUTANEOUS
  Filled 2022-11-23 (×3): qty 0.4

## 2022-11-23 MED ORDER — VANCOMYCIN HCL 10 G IV SOLR
2500.0000 mg | Freq: Once | INTRAVENOUS | Status: AC
  Administered 2022-11-23: 2500 mg via INTRAVENOUS
  Filled 2022-11-23: qty 2500

## 2022-11-23 MED ORDER — METRONIDAZOLE 500 MG/100ML IV SOLN
500.0000 mg | Freq: Once | INTRAVENOUS | Status: AC
  Administered 2022-11-23: 500 mg via INTRAVENOUS
  Filled 2022-11-23: qty 100

## 2022-11-23 MED ORDER — SODIUM CHLORIDE 0.9 % IV SOLN
2.0000 g | INTRAVENOUS | Status: AC
  Administered 2022-11-23: 2 g via INTRAVENOUS
  Filled 2022-11-23: qty 12.5

## 2022-11-23 MED ORDER — LIDOCAINE-PRILOCAINE 2.5-2.5 % EX CREA: 1.0000 | TOPICAL_CREAM | CUTANEOUS | Status: DC | PRN

## 2022-11-23 MED ORDER — INSULIN ASPART 100 UNIT/ML IJ SOLN: 10.0000 [IU] | Freq: Three times a day (TID) | INTRAMUSCULAR | Status: DC

## 2022-11-23 MED ORDER — SODIUM CHLORIDE 0.9 % IV SOLN
1.0000 g | INTRAVENOUS | Status: DC
  Administered 2022-11-24 – 2022-11-27 (×4): 1 g via INTRAVENOUS
  Filled 2022-11-23 (×5): qty 10

## 2022-11-23 MED ORDER — METRONIDAZOLE 500 MG PO TABS
500.0000 mg | ORAL_TABLET | Freq: Three times a day (TID) | ORAL | Status: DC
  Administered 2022-11-23 – 2022-11-28 (×15): 500 mg via ORAL
  Filled 2022-11-23 (×15): qty 1

## 2022-11-23 MED ORDER — ALBUMIN HUMAN 25 % IV SOLN
25.0000 g | INTRAVENOUS | Status: AC | PRN
  Administered 2022-11-23: 25 g via INTRAVENOUS
  Filled 2022-11-23: qty 100

## 2022-11-23 MED ORDER — PANTOPRAZOLE SODIUM 40 MG PO TBEC
40.0000 mg | DELAYED_RELEASE_TABLET | Freq: Every day | ORAL | Status: DC
  Administered 2022-11-23 – 2022-11-30 (×7): 40 mg via ORAL
  Filled 2022-11-23 (×8): qty 1

## 2022-11-23 MED ORDER — LACTATED RINGERS IV BOLUS (SEPSIS)
1000.0000 mL | Freq: Once | INTRAVENOUS | Status: AC
  Administered 2022-11-23: 1000 mL via INTRAVENOUS

## 2022-11-23 NOTE — Hospital Course (Signed)
57 year old black female wc bound baseline Atrial fibrillation CHADVASC >4/Eliquis CKD 4--fresenious S Duodenal carcinoid resected 2021 Lumbar spinal stenosis previously wheelchair-bound-not an operative candidate per Dr. 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-has been transfused previously

## 2022-11-23 NOTE — Progress Notes (Signed)
Received patient in bed to unit.  Alert and oriented.  Informed consent signed and in chart.   TX duration: 3 hours and 45 min  Patient tolerated well.  Transported back to the room  Alert, without acute distress.  Hand-off given to patient's nurse.   Access used: R HD Cath Access issues: A-V, V-A  Total UF removed: Medication(s) given: midodrine, Albumine, Dialudid, see [facility]   11/23/22 1952  Vitals  Temp 97.6 F (36.4 C)  Temp Source Oral  BP (!) 121/95  MAP (mmHg) 104  BP Location Right Wrist  BP Method Automatic  Patient Position (if appropriate) Lying  Pulse Rate 86  Pulse Rate Source Monitor  ECG Heart Rate 86  Resp 19  MEWS COLOR  MEWS Score Color Green  Oxygen Therapy  SpO2 100 %  O2 Device Room Air  MEWS Score  MEWS Temp 0  MEWS Systolic 0  MEWS Pulse 0  MEWS RR 0  MEWS LOC 0  MEWS Score 0     Stacie Glaze LPN [facility]

## 2022-11-23 NOTE — Progress Notes (Signed)
Received patient in bed to unit.  Alert and oriented.  Informed consent signed and in chart.   TX duration:3:45  Patient tolerated well.  Transported back to the room  Alert, without acute distress.  Hand-off given to patient's nurse.   Access used: right [facility] Access issues: none  Total UF removed: 2L Medication(s) given: albumin 25, midodrine    11/23/22 1952  Vitals  Temp 97.6 F (36.4 C)  Temp Source Oral  BP (!) 121/95  MAP (mmHg) 104  BP Location Right Wrist  BP Method Automatic  Patient Position (if appropriate) Lying  Pulse Rate 86  Pulse Rate Source Monitor  ECG Heart Rate 86  Resp 19  Oxygen Therapy  SpO2 100 %  O2 Device Room Air  During Treatment Monitoring  Blood Flow Rate (mL/min) 399 mL/min  Arterial Pressure (mmHg) -175.34 mmHg  Venous Pressure (mmHg) 162.21 mmHg  TMP (mmHg) 10.5 mmHg  Ultrafiltration Rate (mL/min) 819 mL/min  Dialysate Flow Rate (mL/min) 299 ml/min  Duration of HD Treatment -hour(s) 3.7 hour(s)  Cumulative Fluid Removed (mL) per Treatment  1962.71  HD Safety Checks Performed Yes  Intra-Hemodialysis Comments Tx completed  Dialysis Fluid Bolus Normal Saline  Bolus Amount (mL) 300 mL      Saphira Lahmann S Jolan [facility]

## 2022-11-23 NOTE — Procedures (Signed)
I was present at this dialysis session, have reviewed the session and made  appropriate changes Vinson Moselle MD  [facility] 11/23/2022, 5:41 PM

## 2022-11-23 NOTE — ED Notes (Signed)
ED TO INPATIENT HANDOFF REPORT  ED Nurse Name and Phone #: Marcello Moores [phone]  S Name/Age/Gender Deborah [name] 57 y.o. female Room/Bed: 001C/001C  Code Status   Code Status: Full Code  Home/SNF/Other Home Patient oriented to: self and time Is this baseline? No   Triage Complete: Triage complete  Chief Complaint Sepsis Deborah [name] [facility]) [A41.9]  Triage Note Pt is a 57 yo chronically ill comes in with weakness via EMS. She missed dialysis today. She was was found to have low BP 100/50 with EMS, as well as a high blood glucose of 503. She also has multiple ulcers on her backside from lack of mobility.    Allergies Allergies  Allergen Reactions   Diazepam [name]   Gabapentin [name] and Swelling    Other reaction(s): Unknown   Iodinated Contrast Media Anaphylaxis and [name]    11/29/17 Cardiac arrest 1 min after IV contrast, possible allergy vs vasovagal episode  Iopamidol   Anaphylaxis   High 11/28/2017   Patient had seizure like activity and then code post 100 cc of isovue 300   Iopamidol Anaphylaxis    11/28/17 Patient had seizure like activity and then 1 min code after 100 cc of isovue 300. Possible contrast allergy vs vasovagal episode  Other Reaction(s): Cardiac Arrest   Lisinopril Anaphylaxis    Tongue and mouth swelling  Other Reaction(s): Laryngeal Edema   Metoclopramide Other (See Comments)    Tardive dyskinesia  Also known as Reglan  Other Reaction(s): Unknown   Nsaids Anaphylaxis and Other (See Comments)    ULCER  Other Reaction(s): Unknown   Penicillins Palpitations and Itching    Has patient had a PCN reaction causing immediate rash, facial/tongue/throat swelling, SOB or lightheadedness with hypotension: Yes, heart races  Has patient had a PCN reaction causing severe rash involving mucus membranes or skin necrosis: No  Has patient had a PCN reaction that required hospitalization: Yes   Has patient had a PCN  reaction occurring within the last 10 years: No  Other Reaction(s): Flushing (Red Skin), Laryngeal Edema   Chlorhexidine Dermatitis   Tolmetin Nausea Only, Other (See Comments) and Nausea And Vomiting    ULCER  Other Reaction(s): Unknown   Aspartame And Phenylalanine Hives   Aspirin Other (See Comments)    Irritates stomach ulcer    Dicyclomine Other (See Comments)    Chest pain  Other Reaction(s): Unknown   Rifamycin     Other Reaction(s): palpitations   Acetaminophen Nausea Only, Other (See Comments) and Nausea And Vomiting    Irritates stomach ulcer; Abdominal pain  Other Reaction(s): Abdominal Pain, ulcer   Cyclobenzaprine Palpitations    Other Reaction(s): palpitations   Oxycodone Palpitations    Other Reaction(s): palpitations   Rifamycins Palpitations   Tramadol Nausea And Vomiting    Other Reaction(s): palpitations    Level of Care/Admitting Diagnosis ED Disposition     ED Disposition  Admit   Condition  --   Comment  H[facility] [[id number]]  Level of Care: Progressive [102]  Admit to Progressive based on following criteria: MULTISYSTEM THREATS such as stable sepsis, metabolic/electrolyte imbalance with or without encephalopathy that is responding to early treatment.  May admit patient to [facility] or [facility] if equivalent level of care is available:: No  Covid Evaluation: Asymptomatic - no recent exposure (last 10 days) testing not required  Diagnosis: Sepsis [facility]) [[id number]]  Admitting Physician: Dolly Rias [[id number]]  Attending Physician: Dolly Rias [  [id number]]  Certification:: I certify this patient will need inpatient services for at least 2 midnights  Expected Medical Readiness: 11/26/2022          B Medical/Surgery History Past Medical History:  Diagnosis Date   Acute back pain with sciatica, left    Acute back pain with sciatica, right    AKI (acute kidney injury) (HCC)    Anemia, unspecified     Cancer (HCC)    Carcinoid tumor of duodenum    Chest pain with normal coronary angiography 2019   Chronic kidney disease, stage 3b (HCC)    Chronic pain    Chronic systolic CHF (congestive heart failure) (HCC)    Diabetes mellitus    DKA (diabetic ketoacidosis) (HCC)    Drug-seeking behavior    21 hospitalizations and 14 CT a/p in 2 years for N/V and abdominal pain, demanding only IV dilaudid   Elevated troponin    chronic   Esophageal reflux    Fibromyalgia    Gastric ulcer    Gastroparesis    Gout    Hyperlipidemia    Hypertension    Hypokalemia    Hypomagnesemia    Lumbosacral stenosis    LVH (left ventricular hypertrophy)    Morbid obesity (HCC)    NICM (nonischemic cardiomyopathy) (HCC)    PAF (paroxysmal atrial fibrillation) (HCC)    Stroke (HCC) 02/2011   Thrombocytosis    Vitamin B12 deficiency anemia    Past Surgical History:  Procedure Laterality Date   AV FISTULA PLACEMENT Left 06/30/2022   Procedure: LEFT BRACHIOCEPHALIC ARTERIOVENOUS (AV) FISTULA CREATION;  Surgeon: Cephus Shelling, MD;  Location: [facility] OR;  Service: Vascular;  Laterality: Left;   BIOPSY  07/27/2019   Procedure: BIOPSY;  Surgeon: Vida Rigger, MD;  Location: [facility];  Service: Endoscopy;;   BIOPSY  07/30/2019   Procedure: BIOPSY;  Surgeon: Kathi Der, MD;  Location: [facility];  Service: Gastroenterology;;   CATARACT EXTRACTION  01/2014   CHOLECYSTECTOMY     COLONOSCOPY WITH PROPOFOL N/A 07/30/2019   Procedure: COLONOSCOPY WITH PROPOFOL;  Surgeon: Kathi Der, MD;  Location: [facility];  Service: Gastroenterology;  Laterality: N/A;   ESOPHAGOGASTRODUODENOSCOPY N/A 07/27/2019   Procedure: ESOPHAGOGASTRODUODENOSCOPY (EGD);  Surgeon: Vida Rigger, MD;  Location: [facility];  Service: Endoscopy;  Laterality: N/A;   ESOPHAGOGASTRODUODENOSCOPY N/A 07/26/2020   Procedure: ESOPHAGOGASTRODUODENOSCOPY (EGD);  Surgeon: Willis Modena, MD;  Location: [facility];  Service: Endoscopy;   Laterality: N/A;   ESOPHAGOGASTRODUODENOSCOPY (EGD) WITH PROPOFOL N/A 08/02/2019   Procedure: ESOPHAGOGASTRODUODENOSCOPY (EGD) WITH PROPOFOL;  Surgeon: Kathi Der, MD;  Location: [facility];  Service: Gastroenterology;  Laterality: N/A;   HEMOSTASIS CLIP PLACEMENT  08/02/2019   Procedure: HEMOSTASIS CLIP PLACEMENT;  Surgeon: Kathi Der, MD;  Location: [facility];  Service: Gastroenterology;;   IR FLUORO GUIDE CV LINE RIGHT  06/24/2022   IR US GUIDE VASC ACCESS RIGHT  06/24/2022   POLYPECTOMY  07/30/2019   Procedure: POLYPECTOMY;  Surgeon: Kathi Der, MD;  Location: [facility];  Service: Gastroenterology;;   POLYPECTOMY  08/02/2019   Procedure: POLYPECTOMY;  Surgeon: Kathi Der, MD;  Location: [facility];  Service: Gastroenterology;;     A IV Location/Drains/Wounds Patient Lines/Drains/Airways Status     Active Line/Drains/Airways     Name Placement date Placement time Site Days   Peripheral IV 11/22/22 20 G 1" Anterior;Proximal;Right Forearm 11/22/22  2349  Forearm  1   Fistula / Graft Left Upper arm Arteriovenous fistula 06/30/22  1200  Upper arm  146  Hemodialysis Catheter Right Subclavian Double lumen Permanent (Tunneled) 06/24/22  1644  Subclavian  152   Wound / Incision (Open or Dehisced) 06/19/22 Skin tear Sacrum Mid stage 2 small abraison present on admission 06/19/22  1840  Sacrum  157   Wound / Incision (Open or Dehisced) 06/19/22 Heel Left 06/19/22  2100  Heel  157            Intake/Output Last 24 hours No intake or output data in the 24 hours ending 11/23/22 1351  Labs/Imaging Results for orders placed or performed during the hospital encounter of 11/22/22 (from the past 48 hour(s))  Resp panel by RT-PCR (RSV, Flu A&B, Covid) Anterior Nasal Swab     Status: None   Collection Time: 11/23/22 12:23 AM   Specimen: Anterior Nasal Swab  Result Value Ref Range   SARS Coronavirus 2 by RT PCR NEGATIVE NEGATIVE   Influenza A by PCR NEGATIVE  NEGATIVE   Influenza B by PCR NEGATIVE NEGATIVE    Comment: (NOTE) The Xpert Xpress SARS-CoV-2/FLU/RSV plus assay is intended as an aid in the diagnosis of influenza from Nasopharyngeal swab specimens and should not be used as a sole basis for treatment. Nasal washings and aspirates are unacceptable for Xpert Xpress SARS-CoV-2/FLU/RSV testing.  Fact Sheet for Patients: BloggerCourse.com  Fact Sheet for Healthcare Providers: SeriousBroker.it  This test is not yet approved or cleared by the [place] FDA and has been authorized for detection and/or diagnosis of SARS-CoV-2 by FDA under an Emergency Use Authorization (EUA). This EUA will remain in effect (meaning this test can be used) for the duration of the COVID-19 declaration under Section 564(b)(1) of the Act, 21 U.S.C. section 360bbb-3(b)(1), unless the authorization is terminated or revoked.     Resp Syncytial Virus by PCR NEGATIVE NEGATIVE    Comment: (NOTE) Fact Sheet for Patients: BloggerCourse.com  Fact Sheet for Healthcare Providers: SeriousBroker.it  This test is not yet approved or cleared by the [place] FDA and has been authorized for detection and/or diagnosis of SARS-CoV-2 by FDA under an Emergency Use Authorization (EUA). This EUA will remain in effect (meaning this test can be used) for the duration of the COVID-19 declaration under Section 564(b)(1) of the Act, 21 U.S.C. section 360bbb-3(b)(1), unless the authorization is terminated or revoked.  Performed at [facility], [address] [place], [place] [address]   Comprehensive metabolic panel     Status: Abnormal   Collection Time: 11/23/22 12:23 AM  Result Value Ref Range   Sodium 127 (L) 135 - 145 mmol/L   Potassium 4.0 3.5 - 5.1 mmol/L   Chloride 86 (L) 98 - 111 mmol/L   CO2 16 (L) 22 - 32 mmol/L   Glucose, Bld 270 (H) 70 - 99 mg/dL     Comment: Glucose reference range applies only to samples taken after fasting for at least 8 hours.   BUN 68 (H) 6 - 20 mg/dL   Creatinine, Ser 42.59 (H) 0.44 - 1.00 mg/dL   Calcium 9.3 8.9 - 56.3 mg/dL   Total Protein 7.8 6.5 - 8.1 g/dL   Albumin 2.2 (L) 3.5 - 5.0 g/dL   AST 47 (H) 15 - 41 U/L   ALT 15 0 - 44 U/L   Alkaline Phosphatase 86 38 - 126 U/L   Total Bilirubin 1.3 (H) 0.3 - 1.2 mg/dL   GFR, Estimated 4 (L) >60 mL/min    Comment: (NOTE) Calculated using the CKD-EPI Creatinine Equation (2021)    Anion  gap 25 (H) 5 - 15    Comment: ELECTROLYTES REPEATED TO VERIFY Performed at [facility], [address] [place], [place] [address]   CBC with Differential     Status: Abnormal   Collection Time: 11/23/22 12:23 AM  Result Value Ref Range   WBC 30.3 (H) 4.0 - 10.5 K/uL   RBC 2.90 (L) 3.87 - 5.11 MIL/uL   Hemoglobin 7.7 (L) 12.0 - 15.0 g/dL   HCT 42.5 (L) 95.6 - 38.7 %   MCV 91.7 80.0 - 100.0 fL   MCH 26.6 26.0 - 34.0 pg   MCHC 28.9 (L) 30.0 - 36.0 g/dL   RDW 56.4 (H) 33.2 - 95.1 %   Platelets 345 150 - 400 K/uL   nRBC 0.0 0.0 - 0.2 %   Neutrophils Relative % 90 %   Neutro Abs 27.1 (H) 1.7 - 7.7 K/uL   Lymphocytes Relative 6 %   Lymphs Abs 1.8 0.7 - 4.0 K/uL   Monocytes Relative 2 %   Monocytes Absolute 0.7 0.1 - 1.0 K/uL   Eosinophils Relative 0 %   Eosinophils Absolute 0.1 0.0 - 0.5 K/uL   Basophils Relative 0 %   Basophils Absolute 0.1 0.0 - 0.1 K/uL   Immature Granulocytes 2 %   Abs Immature Granulocytes 0.48 (H) 0.00 - 0.07 K/uL    Comment: Performed at [facility], [address] [place], [place] [address]  Protime-INR     Status: Abnormal   Collection Time: 11/23/22 12:23 AM  Result Value Ref Range   Prothrombin Time 20.9 (H) 11.4 - 15.2 seconds   INR 1.8 (H) 0.8 - 1.2    Comment: (NOTE) INR goal varies based on device and disease states. Performed at [facility], [address], [place], [place] [address]   APTT     Status:  Abnormal   Collection Time: 11/23/22 12:23 AM  Result Value Ref Range   aPTT 44 (H) 24 - 36 seconds    Comment:        IF BASELINE aPTT IS ELEVATED, SUGGEST PATIENT RISK ASSESSMENT BE USED TO DETERMINE APPROPRIATE ANTICOAGULANT THERAPY. Performed at [facility], [address] [place], [place] [address]   Blood Culture (routine x 2)     Status: None (Preliminary result)   Collection Time: 11/23/22 12:23 AM   Specimen: BLOOD  Result Value Ref Range   Specimen Description BLOOD SITE NOT SPECIFIED    Special Requests      BOTTLES DRAWN AEROBIC AND ANAEROBIC Blood Culture adequate volume   Culture      NO GROWTH < 12 HOURS Performed at [facility] [place] [facility], [address], [place], [place] [address]    Report Status PENDING   Brain natriuretic peptide     Status: Abnormal   Collection Time: 11/23/22 12:23 AM  Result Value Ref Range   B Natriuretic Peptide 120.4 (H) 0.0 - 100.0 pg/mL    Comment: Performed at [facility] Lab, [address] [place], [place] [address]  Troponin I (High Sensitivity)     Status: Abnormal   Collection Time: 11/23/22 12:23 AM  Result Value Ref Range   Troponin I (High Sensitivity) 20 (H) <18 ng/L    Comment: (NOTE) Elevated high sensitivity troponin I (hsTnI) values and significant  changes across serial measurements may suggest ACS but many other  chronic and acute conditions are known to elevate hsTnI results.  Refer to the "Links" section for  chest pain algorithms and additional  guidance. Performed at [facility], [address] [place], [place] [address]   I-Stat Lactic Acid, ED     Status: Abnormal   Collection Time: 11/23/22 12:35 AM  Result Value Ref Range   Lactic Acid, Venous 4.4 (HH) 0.5 - 1.9 mmol/L   Comment NOTIFIED PHYSICIAN   Troponin I (High Sensitivity)     Status: None   Collection Time: 11/23/22  1:44 AM  Result Value Ref Range   Troponin I (High Sensitivity) 16 <18 ng/L    Comment: (NOTE) Elevated high  sensitivity troponin I (hsTnI) values and significant  changes across serial measurements may suggest ACS but many other  chronic and acute conditions are known to elevate hsTnI results.  Refer to the "Links" section for chest pain algorithms and additional  guidance. Performed at [facility], [address], [place], [place] [address]   I-Stat Lactic Acid, ED     Status: Abnormal   Collection Time: 11/23/22  2:17 AM  Result Value Ref Range   Lactic Acid, Venous 3.8 (HH) 0.5 - 1.9 mmol/L  I-Stat arterial blood gas, ED ([facility], [facility], [facility])     Status: Abnormal   Collection Time: 11/23/22  2:18 AM  Result Value Ref Range   pH, Arterial 7.420 7.35 - 7.45   pCO2 arterial 29.9 (L) 32 - 48 mmHg   pO2, Arterial 96 83 - 108 mmHg   Bicarbonate 19.6 (L) 20.0 - 28.0 mmol/L   TCO2 21 (L) 22 - 32 mmol/L   O2 Saturation 98 %   Acid-base deficit 5.0 (H) 0.0 - 2.0 mmol/L   Sodium 127 (L) 135 - 145 mmol/L   Potassium 3.7 3.5 - 5.1 mmol/L   Calcium, Ion 1.17 1.15 - 1.40 mmol/L   HCT 23.0 (L) 36.0 - 46.0 %   Hemoglobin 7.8 (L) 12.0 - 15.0 g/dL   Patient temperature 16.6 F    Collection site RADIAL, ALLEN'S TEST ACCEPTABLE    Drawn by RT    Sample type ARTERIAL   Osmolality     Status: None   Collection Time: 11/23/22  3:57 AM  Result Value Ref Range   Osmolality 291 275 - 295 mOsm/kg    Comment: Performed at [facility], [address], [place], [place] [address]  Type and screen [facility]     Status: None (Preliminary result)   Collection Time: 11/23/22  3:57 AM  Result Value Ref Range   ABO/RH(D) O POS    Antibody Screen NEG    Sample Expiration 11/26/2022,2359    Unit Number [id number]    Blood Component Type RED CELLS,LR    Unit division 00    Status of Unit ALLOCATED    Donor AG Type NEGATIVE FOR E ANTIGEN    Transfusion Status OK TO TRANSFUSE    Crossmatch Result COMPATIBLE    Unit Number [id number]    Blood Component Type RED CELLS,LR    Unit  division 00    Status of Unit ALLOCATED    Donor AG Type NEGATIVE FOR E ANTIGEN    Transfusion Status OK TO TRANSFUSE    Crossmatch Result COMPATIBLE   Lactic acid, plasma     Status: None   Collection Time: 11/23/22  5:15 AM  Result Value Ref Range   Lactic Acid, Venous 1.7 0.5 - 1.9 mmol/L    Comment: Performed at [facility], [address] [place],  [place] [address]  CBG monitoring, ED     Status: Abnormal   Collection Time: 11/23/22  7:42 AM  Result Value Ref Range   Glucose-Capillary 120 (H) 70 - 99 mg/dL    Comment: Glucose reference range applies only to samples taken after fasting for at least 8 hours.  Lactic acid, plasma     Status: None   Collection Time: 11/23/22  9:14 AM  Result Value Ref Range   Lactic Acid, Venous 1.0 0.5 - 1.9 mmol/L    Comment: Performed at [facility], [address], [place], [place] [address]  CBG monitoring, ED     Status: Abnormal   Collection Time: 11/23/22 10:47 AM  Result Value Ref Range   Glucose-Capillary 136 (H) 70 - 99 mg/dL    Comment: Glucose reference range applies only to samples taken after fasting for at least 8 hours.  CBG monitoring, ED     Status: Abnormal   Collection Time: 11/23/22 11:50 AM  Result Value Ref Range   Glucose-Capillary 127 (H) 70 - 99 mg/dL    Comment: Glucose reference range applies only to samples taken after fasting for at least 8 hours.  CBG monitoring, ED     Status: Abnormal   Collection Time: 11/23/22  1:48 PM  Result Value Ref Range   Glucose-Capillary 117 (H) 70 - 99 mg/dL    Comment: Glucose reference range applies only to samples taken after fasting for at least 8 hours.   US Abdomen Limited RUQ (LIVER/GB)  Result Date: 11/23/2022 CLINICAL DATA:  History of prior cholecystectomy with elevated BUN EXAM: ULTRASOUND ABDOMEN LIMITED RIGHT UPPER QUADRANT COMPARISON:  None Available. FINDINGS: Gallbladder: Surgically removed Common bile duct: Diameter: 3.6 mm Liver: No focal lesion  identified. Within normal limits in parenchymal echogenicity. Portal vein is patent on color Doppler imaging with normal direction of blood flow towards the liver. Other: None. IMPRESSION: No acute abnormality noted. Electronically Signed   By: Alcide Clever M.D.   On: 11/23/2022 03:02   DG Chest Port 1 View  Result Date: 11/23/2022 CLINICAL DATA:  Sepsis, weakness EXAM: PORTABLE CHEST 1 VIEW COMPARISON:  10/19/2022 FINDINGS: Lungs are clear.  No pleural effusion or pneumothorax. The heart is top-normal in size. Right IJ dual lumen dialysis catheter terminating at the cavoatrial junction. IMPRESSION: No acute cardiopulmonary disease. Electronically Signed   By: Charline Bills M.D.   On: 11/23/2022 00:41    Pending Labs Unresulted Labs (From admission, onward)     Start     Ordered   11/23/22 0934  Phosphorus  Add-on,   AD        11/23/22 0933   11/23/22 0918  Hepatitis B surface antigen  (New Admission Hemo Labs (Hepatitis B))  Once,   R        11/23/22 0920   11/23/22 0918  Hepatitis B surface antibody,quantitative  (New Admission Hemo Labs (Hepatitis B))  Once,   R        11/23/22 0920   11/23/22 0332  Osmolality, urine  Once,   R        11/23/22 0332   11/23/22 0332  Sodium, urine, random  Once,   R        11/23/22 0332   11/22/22 2354  Blood Culture (routine x 2)  (Septic presentation on arrival (screening labs, nursing and treatment orders for obvious sepsis))  BLOOD CULTURE X 2,   STAT      11/22/22 2354  11/22/22 2354  Urinalysis, w/ Reflex to Culture (Infection Suspected) -Urine, Clean Catch  (Septic presentation on arrival (screening labs, nursing and treatment orders for obvious sepsis))  ONCE - URGENT,   URGENT       Question:  Specimen Source  Answer:  Urine, Clean Catch   11/22/22 2354            Vitals/Pain Today's Vitals   11/23/22 1136 11/23/22 1230 11/23/22 1330 11/23/22 1345  BP: 99/60 (!) 101/56 (!) 94/52 (!) 105/59  Pulse: 88 85 85 99  Resp: 19 18 (!) 22  (!) 21  Temp:      TempSrc:      SpO2: 100% 97% 100% 97%  PainSc:        Isolation Precautions No active isolations  Medications Medications  sodium chloride flush (NS) 0.9 % injection 3 mL (3 mLs Intravenous Given 11/23/22 0913)  albuterol (PROVENTIL) (2.5 MG/3ML) 0.083% nebulizer solution 2.5 mg (has no administration in time range)  ceFEPIme (MAXIPIME) 1 g in sodium chloride 0.9 % 100 mL IVPB (has no administration in time range)  metroNIDAZOLE (FLAGYL) tablet 500 mg (500 mg Oral Given 11/23/22 0913)  HYDROmorphone (DILAUDID) tablet 2 mg (has no administration in time range)    Or  HYDROmorphone (DILAUDID) tablet 4 mg (has no administration in time range)  DULoxetine (CYMBALTA) DR capsule 40 mg (40 mg Oral Given 11/23/22 1048)  mirtazapine (REMERON SOL-TAB) disintegrating tablet 15 mg (has no administration in time range)  insulin glargine-yfgn (SEMGLEE) injection 40 Units (40 Units Subcutaneous Given 11/23/22 1049)  insulin aspart (novoLOG) injection 10 Units (10 Units Subcutaneous Not Given 11/23/22 1151)  insulin aspart (novoLOG) injection 0-15 Units (2 Units Subcutaneous Given 11/23/22 1153)  pantoprazole (PROTONIX) EC tablet 40 mg (40 mg Oral Given 11/23/22 0913)  ondansetron (ZOFRAN) injection 4 mg (has no administration in time range)  apixaban (ELIQUIS) tablet 5 mg (5 mg Oral Given 11/23/22 1048)  vancomycin variable dose per unstable renal function (pharmacist dosing) (has no administration in time range)  pentafluoroprop-tetrafluoroeth (GEBAUERS) aerosol 1 Application (has no administration in time range)  lidocaine (PF) (XYLOCAINE) 1 % injection 5 mL (has no administration in time range)  lidocaine-prilocaine (EMLA) cream 1 Application (has no administration in time range)  feeding supplement (NEPRO CARB STEADY) liquid 237 mL (has no administration in time range)  heparin injection 1,000 Units (has no administration in time range)  anticoagulant sodium citrate solution 5 mL  (has no administration in time range)  alteplase (CATHFLO ACTIVASE) injection 2 mg (has no administration in time range)  doxercalciferol (HECTOROL) injection 4 mcg (has no administration in time range)  influenza vac split trivalent PF (FLULAVAL) injection 0.5 mL (has no administration in time range)  lactated ringers bolus 1,000 mL (0 mLs Intravenous Stopped 11/23/22 0204)  metroNIDAZOLE (FLAGYL) IVPB 500 mg (0 mg Intravenous Stopped 11/23/22 0231)  vancomycin (VANCOCIN) 2,500 mg in sodium chloride 0.9 % 500 mL IVPB (0 mg Intravenous Stopped 11/23/22 0506)  ceFEPIme (MAXIPIME) 2 g in sodium chloride 0.9 % 100 mL IVPB (0 g Intravenous Stopped 11/23/22 0204)  lactated ringers bolus 500 mL (0 mLs Intravenous Stopped 11/23/22 0506)    Mobility non-ambulatory     Focused Assessments Renal Assessment Handoff:  Hemodialysis Schedule: Hemodialysis Schedule: Monday/Wednesday/Friday Last Hemodialysis date and time:    Restricted appendage: left arm   R Recommendations: See Admitting Provider Note  Report given to:   Additional Notes:

## 2022-11-23 NOTE — Consult Note (Signed)
Renal Service Consult Note [facility]  SCOTTIE KRISTON 11/23/2022 [name], MD Requesting Physician: Dr. Mahala Menghini  Reason for Consult: ESRD pt w/ sepsis and probable cellulitis / wound infection HPI: The patient is a 57 y.o. year-old w/ PMH as below who presented to last night due to gen'd weakness. In ED BP 100/50, BS 503, multiple back side ulcers due to lack of mobility. Also having N/V and diarrhea (acute on chronic). No f/c/s, no abd pain. In ED she rec'd 2 L NS bolus, IV abx and BP's improved. Pt had some wounds on her back side per H&P. Pt was admitted. We are asked to see for dialysis.   Pt seen in ED room. No c/o's, drowsy, good historian. Lives alone w/ family nearby. Needs assist getting into wheelchair. Gets public transport to OP dialysis. Family helps her w/ transfers. She is bed/ sofa to WC dependent.   ROS - denies CP, no joint pain, no HA, no blurry vision, no rash, no diarrhea, no nausea/ vomiting, no dysuria, no difficulty voiding   Past Medical History  Past Medical History:  Diagnosis Date   Acute back pain with sciatica, left    Acute back pain with sciatica, right    AKI (acute kidney injury) (HCC)    Anemia, unspecified    Cancer (HCC)    Carcinoid tumor of duodenum    Chest pain with normal coronary angiography 2019   Chronic kidney disease, stage 3b (HCC)    Chronic pain    Chronic systolic CHF (congestive heart failure) (HCC)    Diabetes mellitus    DKA (diabetic ketoacidosis) (HCC)    Drug-seeking behavior    21 hospitalizations and 14 CT a/p in 2 years for N/V and abdominal pain, demanding only IV dilaudid   Elevated troponin    chronic   Esophageal reflux    Fibromyalgia    Gastric ulcer    Gastroparesis    Gout    Hyperlipidemia    Hypertension    Hypokalemia    Hypomagnesemia    Lumbosacral stenosis    LVH (left ventricular hypertrophy)    Morbid obesity (HCC)    NICM (nonischemic cardiomyopathy) (HCC)    PAF  (paroxysmal atrial fibrillation) (HCC)    Stroke (HCC) 02/2011   Thrombocytosis    Vitamin B12 deficiency anemia    Past Surgical History  Past Surgical History:  Procedure Laterality Date   AV FISTULA PLACEMENT Left 06/30/2022   Procedure: LEFT BRACHIOCEPHALIC ARTERIOVENOUS (AV) FISTULA CREATION;  Surgeon: Cephus Shelling, MD;  Location: [facility] OR;  Service: Vascular;  Laterality: Left;   BIOPSY  07/27/2019   Procedure: BIOPSY;  Surgeon: Vida Rigger, MD;  Location: [facility];  Service: Endoscopy;;   BIOPSY  07/30/2019   Procedure: BIOPSY;  Surgeon: Kathi Der, MD;  Location: [facility];  Service: Gastroenterology;;   CATARACT EXTRACTION  01/2014   CHOLECYSTECTOMY     COLONOSCOPY WITH PROPOFOL N/A 07/30/2019   Procedure: COLONOSCOPY WITH PROPOFOL;  Surgeon: Kathi Der, MD;  Location: [facility];  Service: Gastroenterology;  Laterality: N/A;   ESOPHAGOGASTRODUODENOSCOPY N/A 07/27/2019   Procedure: ESOPHAGOGASTRODUODENOSCOPY (EGD);  Surgeon: Vida Rigger, MD;  Location: [facility];  Service: Endoscopy;  Laterality: N/A;   ESOPHAGOGASTRODUODENOSCOPY N/A 07/26/2020   Procedure: ESOPHAGOGASTRODUODENOSCOPY (EGD);  Surgeon: Willis Modena, MD;  Location: [facility];  Service: Endoscopy;  Laterality: N/A;   ESOPHAGOGASTRODUODENOSCOPY (EGD) WITH PROPOFOL N/A 08/02/2019   Procedure: ESOPHAGOGASTRODUODENOSCOPY (EGD) WITH PROPOFOL;  Surgeon: Kathi Der, MD;  Location: [facility];  Service: Gastroenterology;  Laterality: N/A;   HEMOSTASIS CLIP PLACEMENT  08/02/2019   Procedure: HEMOSTASIS CLIP PLACEMENT;  Surgeon: Kathi Der, MD;  Location: [facility];  Service: Gastroenterology;;   IR FLUORO GUIDE CV LINE RIGHT  06/24/2022   IR US GUIDE VASC ACCESS RIGHT  06/24/2022   POLYPECTOMY  07/30/2019   Procedure: POLYPECTOMY;  Surgeon: Kathi Der, MD;  Location: [facility];  Service: Gastroenterology;;   POLYPECTOMY  08/02/2019   Procedure: POLYPECTOMY;  Surgeon:  Kathi Der, MD;  Location: [facility];  Service: Gastroenterology;;   Family History  Family History  Problem Relation Age of Onset   Diabetes Mother    Diabetes Father    Heart disease Father    Diabetes Sister    Congestive Heart Failure Sister 86   Diabetes Brother    Social History  reports that she has never smoked. She has never used smokeless tobacco. She reports that she does not drink alcohol and does not use drugs. Allergies  Allergies  Allergen Reactions   Diazepam [name]   Gabapentin [name] and Swelling    Other reaction(s): Unknown   Iodinated Contrast Media Anaphylaxis and [name]    11/29/17 Cardiac arrest 1 min after IV contrast, possible allergy vs vasovagal episode  Iopamidol   Anaphylaxis   High 11/28/2017   Patient had seizure like activity and then code post 100 cc of isovue 300   Iopamidol Anaphylaxis    11/28/17 Patient had seizure like activity and then 1 min code after 100 cc of isovue 300. Possible contrast allergy vs vasovagal episode  Other Reaction(s): Cardiac Arrest   Lisinopril Anaphylaxis    Tongue and mouth swelling  Other Reaction(s): Laryngeal Edema   Metoclopramide Other (See Comments)    Tardive dyskinesia  Also known as Reglan  Other Reaction(s): Unknown   Nsaids Anaphylaxis and Other (See Comments)    ULCER  Other Reaction(s): Unknown   Penicillins Palpitations and Itching    Has patient had a PCN reaction causing immediate rash, facial/tongue/throat swelling, SOB or lightheadedness with hypotension: Yes, heart races  Has patient had a PCN reaction causing severe rash involving mucus membranes or skin necrosis: No  Has patient had a PCN reaction that required hospitalization: Yes   Has patient had a PCN reaction occurring within the last 10 years: No  Other Reaction(s): Flushing (Red Skin), Laryngeal Edema   Chlorhexidine Dermatitis   Tolmetin Nausea Only, Other (See Comments)  and Nausea And Vomiting    ULCER  Other Reaction(s): Unknown   Aspartame And Phenylalanine Hives   Aspirin Other (See Comments)    Irritates stomach ulcer    Dicyclomine Other (See Comments)    Chest pain  Other Reaction(s): Unknown   Rifamycin     Other Reaction(s): palpitations   Acetaminophen Nausea Only, Other (See Comments) and Nausea And Vomiting    Irritates stomach ulcer; Abdominal pain  Other Reaction(s): Abdominal Pain, ulcer   Cyclobenzaprine Palpitations    Other Reaction(s): palpitations   Oxycodone Palpitations    Other Reaction(s): palpitations   Rifamycins Palpitations   Tramadol Nausea And Vomiting    Other Reaction(s): palpitations   Home medications Prior to Admission medications   Medication Sig Start Date End Date Taking? Authorizing Provider  albuterol (PROVENTIL) (2.5 MG/3ML) 0.083% nebulizer solution Take 3 mLs (2.5 mg total) by nebulization every 6 (six) hours as needed for wheezing or shortness of breath. 04/06/19  Yes Bing Neighbors,  NP  allopurinol (ZYLOPRIM) 100 MG tablet Take 100 mg by mouth 2 (two) times daily. 08/05/22  Yes [provider]  apixaban (ELIQUIS) 5 MG TABS tablet Take 5 mg by mouth 2 (two) times daily. 12/03/21  Yes [provider]  HYDROmorphone (DILAUDID) 4 MG tablet Take 1 tablet (4 mg total) by mouth 3 (three) times daily as needed for moderate pain. Three times a day as needed 20 days out of the month. Patient taking differently: Take 4 mg by mouth 2 (two) times daily. 11/18/22  Yes Jones Bales, NP  amLODipine (NORVASC) 10 MG tablet Take 10 mg by mouth daily. 08/16/22   [provider]  atorvastatin (LIPITOR) 10 MG tablet Take 10 mg by mouth at bedtime. 12/03/21   [provider]  AURYXIA 1 GM 210 MG(Fe) tablet Take by mouth. 08/09/22   [provider]  busPIRone (BUSPAR) 5 MG tablet Take 5 mg by mouth 2 (two) times daily.    [provider]  carvedilol (COREG) 12.5 MG tablet  Take 1 tablet (12.5 mg total) by mouth 2 (two) times daily with a meal. 06/04/22   Leroy Sea, MD  cetirizine (ZYRTEC) 10 MG tablet Take by mouth. 08/05/22   [provider]  colchicine 0.6 MG tablet Take 0.5 tablets (0.3 mg total) by mouth 2 (two) times a week. 07/15/22   Zannie Cove, MD  donepezil (ARICEPT) 5 MG tablet Take by mouth. 08/05/22   [provider]  Doxercalciferol (HECTOROL IV) Doxercalciferol (Hectorol) 08/20/22 08/19/23  [provider]  doxycycline (VIBRAMYCIN) 100 MG capsule Take 1 capsule (100 mg total) by mouth 2 (two) times daily. 10/19/22   Terrilee Files, MD  DULoxetine HCl 40 MG CPEP Take by mouth. 08/05/22   [provider]  EASY COMFORT PEN NEEDLES 31G X 5 MM MISC USE 3 TIMES A DAY FOR INSULIN ADMINISTRATION 11/14/19   Meccariello, Solmon Ice, MD  famotidine (PEPCID) 20 MG tablet Take 1 tablet (20 mg total) by mouth daily. 03/22/22 07/20/22  Cora Collum, DO  ferrous sulfate 325 (65 FE) MG tablet Take 1 tablet (325 mg total) by mouth 2 (two) times daily with a meal. 05/27/22   Kathlen Mody, MD  fluticasone (FLONASE) 50 MCG/ACT nasal spray Place 2 sprays into both nostrils daily as needed for allergies or rhinitis. Patient taking differently: Place 1 spray into both nostrils daily as needed for allergies. 12/19/18   Rai, Delene Ruffini, MD  folic acid (FOLVITE) 1 MG tablet Take 1 mg by mouth daily. 04/07/21   [provider]  furosemide (LASIX) 80 MG tablet Take 1 tablet (80 mg total) by mouth daily. 07/06/22   Rhetta Mura, MD  hydrALAZINE (APRESOLINE) 50 MG tablet Take 50 mg by mouth 3 (three) times daily. 08/05/22   [provider]  insulin aspart (NOVOLOG) 100 UNIT/ML injection Inject 5 Units into the skin 3 (three) times daily with meals. 06/04/22   Leroy Sea, MD  insulin glargine-yfgn (SEMGLEE) 100 UNIT/ML injection Inject 0.15 mLs (15 Units total) into the skin 2 (two) times daily. 07/05/22   Rhetta Mura, MD  insulin lispro (HUMALOG) 100 UNIT/ML KwikPen Before each meal 3 times a day, 140-199 - 2 units, 200-250 - 6 units, 251-299 - 8 units,  300-349 - 12 units,  350 or above 14 units. Patient taking differently: Inject 2-14 Units into the skin See admin instructions. Before each meal and at bedtime. 140-199 - 2 units, 200-250 - 6  units, 251-299 - 8 units,  300-349 - 12 units,  350 or above 14 units. 06/04/22   Leroy Sea, MD  insulin lispro (HUMALOG) 100 UNIT/ML KwikPen Inject into the skin. 08/30/22   [provider]  isosorbide mononitrate (IMDUR) 30 MG 24 hr tablet Take by mouth. 08/05/22   [provider]  isosorbide mononitrate (IMDUR) 60 MG 24 hr tablet Take 1 tablet by mouth daily. 08/31/22 08/31/23  [provider]  LANTUS SOLOSTAR 100 UNIT/ML Solostar Pen Inject into the skin. 09/10/20   [provider]  loperamide (IMODIUM A-D) 2 MG tablet Take 2 mg by mouth every 6 (six) hours as needed for diarrhea or loose stools.    [provider]  melatonin 3 MG TABS tablet Take by mouth. 10/21/20   [provider]  Methoxy PEG-Epoetin Beta (MIRCERA IJ) Mircera 08/06/22 08/05/23  [provider]  metoprolol succinate (TOPROL-XL) 50 MG 24 hr tablet Take by mouth. 10/21/20   [provider]  mirtazapine (REMERON) 15 MG tablet Take 15 mg by mouth at bedtime. 10/18/22   [provider]  Multiple Vitamin (MULTIVITAMIN WITH MINERALS) TABS tablet Take 1 tablet by mouth daily. 05/28/22   Kathlen Mody, MD  omeprazole (PRILOSEC) 20 MG capsule Take by mouth. 08/05/22   [provider]  ondansetron (ZOFRAN ODT) 4 MG disintegrating tablet Take 1 tablet (4 mg total) by mouth every 8 (eight) hours as needed for nausea or vomiting. 05/30/21   [name], MD  pantoprazole (PROTONIX) 40 MG tablet TAKE 1 TABLET BY MOUTH 2 (TWO) TIMES DAILY. (AM+BEDTIME) 08/30/22   Raulkar, Drema Pry, MD  spironolactone (ALDACTONE) 25 MG  tablet Take 12.5 mg by mouth daily. 08/30/22   [provider]  torsemide (DEMADEX) 100 MG tablet  08/31/22   [provider]  torsemide (DEMADEX) 20 MG tablet TAKE 1 TABLET DAILY AS NEEDED FOR LEG EDEMA/SWELLING (AM) 08/31/22   [provider]  Vitamin D, Ergocalciferol, (DRISDOL) 1.25 MG (50000 UNIT) CAPS capsule Take 50,000 Units by mouth once a week. 04/07/21   [provider]     Vitals:   11/23/22 0645 11/23/22 0700 11/23/22 0730 11/23/22 0900  BP: 106/60 105/68 103/65 100/79  Pulse: 87 86 87 89  Resp: (!) 24 16 18  (!) 25  Temp:      TempSrc:      SpO2: 98% 100% 97% 98%   Exam Gen alert, no distress, on RA No rash, cyanosis or gangrene Sclera anicteric, throat clear  No jvd or bruits Chest clear bilat to bases, no rales/ wheezing RRR no MRG Abd soft ntnd no mass or ascites +bs GU defer MS no joint effusions or deformity Ext bilat R > L pretib edema, tender and erythematous L > R  Neuro is alert, Ox 3 , nf, deconditioned     LUA AVF+bruit/  RIJ TDC      Home meds include - hydralazine 50 tid, remeron, allopurinol , apixaban, dilaudid prn, amlodipine 10, lipitor, auryxia 210mg , buspar, coreg 12.5mg  bid, colchicine, donepezil, duloxetine, pepcid, lasix 80 every day, insulin aspart/ glargine/ lispro, imdur 30/60 every day, toprol xl 50 every day, prilosec, protonix, aldactone 12.5 every day, torsemide 20mg  every day prn, vits/ supps/ prns     06/30/2022 -- > Dr Chestine Spore of [facility] did L BC AVF placement    OP HD: [facility] MWF  4h  400/800    126.9kg  RIJ TDC/  LUA AVF (maturing) Hep 5000+ - hectorol 7 mcg  IV tiw - venofer 50mg  weekly IV - mircera 225 mcg IV q 2 wks, last 9/06, due 9/20   Assessment/ Plan: Sepsis/ SSTI - w/ vol depletion and hypotension. Responding to IV abx and IV fluids.  H/o metastatic carcinoid tumor ESRD - on HD MWF. Missed HD yest and last Friday. HD today , then again tomorrow.  H/o HTN - BP's soft, hold all home  HTN meds Volume - BP's soft due to #1. Got 2L bolus in ED. Volume up on exam, will plan UF 2-2.5 L as tolerated.  Anemia esrd - Hb 7- 8 here. Next  esa is due on 9/27. Follow.  MBD ckd - CCa in range, add on phos, cont IV vdra.   Hyponatremia - due to vol overload, see above H/o afib H/o chronic pain Deconditioning - pt is WC to chair/ bed dependent      Vinson Moselle  MD [facility] 11/23/2022, 9:12 AM  Recent Labs  Lab 11/23/22 0023 11/23/22 0218  HGB 7.7* 7.8*  ALBUMIN 2.2*  --   CALCIUM 9.3  --   CREATININE 10.14*  --   K 4.0 3.7   Inpatient medications:  apixaban  5 mg Oral BID   DULoxetine  40 mg Oral Daily   insulin aspart  0-15 Units Subcutaneous TID WC   insulin aspart  10 Units Subcutaneous TID WC   insulin glargine-yfgn  40 Units Subcutaneous Daily   metroNIDAZOLE  500 mg Oral Q8H   mirtazapine  15 mg Oral QHS   pantoprazole  40 mg Oral Daily   sodium chloride flush  3 mL Intravenous Q12H   vancomycin variable dose per unstable renal function (pharmacist dosing)   Does not apply See admin instructions    [START ON 11/24/2022] ceFEPime (MAXIPIME) IV     albuterol, HYDROmorphone **OR** HYDROmorphone, ondansetron (ZOFRAN) IV

## 2022-11-23 NOTE — Consult Note (Signed)
I have placed a request via Secure Chat to Dr Mahala Menghini requesting photos of the wound areas of concern to be placed in the EMR.   (Heels).   Deborah [name] [facility], CNS, [employer] [phone]

## 2022-11-23 NOTE — Sepsis Progress Note (Signed)
Elink monitoring for the code sepsis protocol.  

## 2022-11-23 NOTE — Progress Notes (Signed)
Wound on R heel examined properly ---she has eschar and some slough. I hope this can be manageable without surgery althoguh am not confident--she is quite scared.  Have spoken to both Dr. Blanchie Dessert and Dr Edilia Bo who will see  Ordered CRP/ESR/MRI and ABI  Pleas Koch, MD [facility] 5:46 PM

## 2022-11-23 NOTE — ED Notes (Signed)
Pt states she does make urine, is unable to void at this time, pt aware sample is needed

## 2022-11-23 NOTE — ED Notes (Signed)
Pt placed on air mattress bed.

## 2022-11-23 NOTE — Progress Notes (Signed)
Pt receives out-pt HD at [facility] on MWF 12:10 chair time. Will assist as needed.   Olivia Canter Renal Navigator [phone]

## 2022-11-23 NOTE — Consult Note (Signed)
WOC Nurse Consult Note: Reason for Consult: sacral and heel wounds Requested images, only able to review buttock wounds via images. Discussed with hospitalist, limited WOC nursing today. Not able to obtain images of the heels at this time, describes them as wet, no notation of necrosis.  Wound type: Pressure injury; right heel Pressure injury; left ischium; Unstageable  Evidence of skin breakdown right ischium  Pressure Injury POA: Yes Measurement:see nursing flow sheets Wound bed: left ischium; 90% black tissue; 10% pink Will need re-assessment of the heel Drainage (amount, consistency, odor) bloody Periwound:intact, see above  Dressing procedure/placement/frequency: Added prevalon boots for offloading affected heel and high risk patient Added low air loss mattress for moisture management and pressure redistribution Added conservative tx for the left ischium at this time; xeroform and silicone foam Added silicone foam to the right ischium; high risk for breakdown Added absorptive dressing for the heel wound, due to heel being reported as "wet" per MD, top with foam. Change daily while inpatient.   Will review nursing flow sheet once patient has transferred to unit, will await images of heel wounds as well.    Re consult if needed, will not follow at this time. Thanks  Davy Westmoreland [employer], RN,CWOCN, CNS, CWON-AP [phone])

## 2022-11-23 NOTE — ED Notes (Signed)
One set of blood cultures obtained, second set was not able to be obtained. Order says to not delay antibiotics for cultures.

## 2022-11-23 NOTE — Progress Notes (Signed)
ED Pharmacy Antibiotic Sign Off An antibiotic consult was received from an ED provider for Vancomycin and Aztreonam (okay to change to Cefepime - pt has tolerated multiple cephalosporins in the past) per pharmacy dosing for sepsis. Of note, pt is ESRD pt. A chart review was completed to assess appropriateness.   The following one time order(s) were placed:  Vancomycin 2500mg  IV now and Cefepime 2gm IV now  Further antibiotic and/or antibiotic pharmacy consults should be ordered by the admitting provider if indicated.   Thank you for allowing pharmacy to be a part of this patient's care.   Christoper Fabian, PharmD, BCPS Please see amion for complete clinical pharmacist phone list 11/23/22 12:52 AM

## 2022-11-23 NOTE — Progress Notes (Signed)
No appropriate vasculature for PIV placement found at this time. Assessed L arm with [place]. R arm restricted. Nurse notified. Tomasita Morrow, RN VAST

## 2022-11-23 NOTE — Consult Note (Signed)
ASSESSMENT & PLAN   PERIPHERAL ARTERIAL DISEASE WITH ULCERATION RIGHT HEEL: Based on her exam she has evidence of infrainguinal arterial occlusive disease bilaterally.  Her ABIs are pending.  Given the wound on the right heel with peripheral arterial disease and diabetes she is at high risk for limb loss.  I would recommend an arteriogram to be sure there is nothing we can do from an endovascular standpoint to improve perfusion.  She is on Eliquis and this will have to be held for 48 hours.  I can potentially try to get her on the schedule for Friday for an arteriogram.  An MRI of the foot has been ordered to rule out osteomyelitis.  Certainly if she had osteomyelitis and this is likely not salvageable.  Recommend the following which can slow the progression of atherosclerosis and reduce the risk of major adverse cardiac / limb events:  Aspirin 81mg  PO QD.  Atorvastatin 40-80mg  PO QD (or other "high intensity" statin therapy). Complete cessation from all tobacco products. Blood glucose control with goal A1c < 7%. Blood pressure control with goal blood pressure < 140/90 mmHg. Lipid reduction therapy with goal LDL-C <100 mg/dL (<16 if symptomatic from PAD).    REASON FOR CONSULT:    Peripheral arterial disease with ulcer right heel.  The consult is requested by Dr. Mahala Menghini.   HPI:   Deborah [name] is a 57 y.o. female who was admitted today with sepsis.  Her chief complaint was weakness.  Her white blood cell count was 30.  She had an extensive wound on her right heel.  She had evidence of underlying peripheral arterial disease and for this reason vascular surgery was consulted.  I tried to obtain a history from her while she was on dialysis but she had a hard time staying awake.  She tells me that she has had the wound on her right heel for a week.    I spoke to her again this morning.  She is more alert now.  She tells me that she has been nonambulatory for about 4 years.  She has  leg weakness secondary to spinal stenosis.  She states that she has had the wound on her heel for a week.  She does not describe any rest pain.  She is not a smoker.  She has end-stage renal disease and dialyzes on Monday Wednesdays and Fridays normally.  She has an upper arm fistula which has not been used yet so she is using a catheter.  She is on Eliquis.   Past Medical History:  Diagnosis Date   Acute back pain with sciatica, left    Acute back pain with sciatica, right    AKI (acute kidney injury) (HCC)    Anemia, unspecified    Cancer (HCC)    Carcinoid tumor of duodenum    Chest pain with normal coronary angiography 2019   Chronic kidney disease, stage 3b (HCC)    Chronic pain    Chronic systolic CHF (congestive heart failure) (HCC)    Diabetes mellitus    DKA (diabetic ketoacidosis) (HCC)    Drug-seeking behavior    21 hospitalizations and 14 CT a/p in 2 years for N/V and abdominal pain, demanding only IV dilaudid   Elevated troponin    chronic   Esophageal reflux    Fibromyalgia    Gastric ulcer    Gastroparesis    Gout    Hyperlipidemia    Hypertension    Hypokalemia  Hypomagnesemia    Lumbosacral stenosis    LVH (left ventricular hypertrophy)    Morbid obesity (HCC)    NICM (nonischemic cardiomyopathy) (HCC)    PAF (paroxysmal atrial fibrillation) (HCC)    Stroke (HCC) 02/2011   Thrombocytosis    Vitamin B12 deficiency anemia     Family History  Problem Relation Age of Onset   Diabetes Mother    Diabetes Father    Heart disease Father    Diabetes Sister    Congestive Heart Failure Sister 34   Diabetes Brother     SOCIAL HISTORY: Social History   Tobacco Use   Smoking status: Never   Smokeless tobacco: Never  Substance Use Topics   Alcohol use: No    Allergies  Allergen Reactions   Diazepam [name]   Gabapentin [name] and Swelling    Other reaction(s): Unknown   Iodinated Contrast Media Anaphylaxis and Shortness  Of Breath    11/29/17 Cardiac arrest 1 min after IV contrast, possible allergy vs vasovagal episode  Iopamidol   Anaphylaxis   High 11/28/2017   Patient had seizure like activity and then code post 100 cc of isovue 300   Iopamidol Anaphylaxis    11/28/17 Patient had seizure like activity and then 1 min code after 100 cc of isovue 300. Possible contrast allergy vs vasovagal episode  Other Reaction(s): Cardiac Arrest   Lisinopril Anaphylaxis    Tongue and mouth swelling  Other Reaction(s): Laryngeal Edema   Metoclopramide Other (See Comments)    Tardive dyskinesia  Also known as Reglan  Other Reaction(s): Unknown   Nsaids Anaphylaxis and Other (See Comments)    ULCER  Other Reaction(s): Unknown   Penicillins Palpitations and Itching    Has patient had a PCN reaction causing immediate rash, facial/tongue/throat swelling, SOB or lightheadedness with hypotension: Yes, heart races  Has patient had a PCN reaction causing severe rash involving mucus membranes or skin necrosis: No  Has patient had a PCN reaction that required hospitalization: Yes   Has patient had a PCN reaction occurring within the last 10 years: No  Other Reaction(s): Flushing (Red Skin), Laryngeal Edema   Chlorhexidine Dermatitis   Tolmetin Nausea Only, Other (See Comments) and Nausea And Vomiting    ULCER  Other Reaction(s): Unknown   Aspartame And Phenylalanine Hives   Aspirin Other (See Comments)    Irritates stomach ulcer    Dicyclomine Other (See Comments)    Chest pain  Other Reaction(s): Unknown   Rifamycin     Other Reaction(s): palpitations   Acetaminophen Nausea Only, Other (See Comments) and Nausea And Vomiting    Irritates stomach ulcer; Abdominal pain  Other Reaction(s): Abdominal Pain, ulcer   Cyclobenzaprine Palpitations    Other Reaction(s): palpitations   Oxycodone Palpitations    Other Reaction(s): palpitations   Rifamycins Palpitations   Tramadol Nausea And Vomiting    Other  Reaction(s): palpitations    Current Facility-Administered Medications  Medication Dose Route Frequency Provider Last Rate Last Admin   0.9 %  sodium chloride infusion (Manually program via Guardrails IV Fluids)   Intravenous Once Julian Reil, Jared M, DO       albuterol (PROVENTIL) (2.5 MG/3ML) 0.083% nebulizer solution 2.5 mg  2.5 mg Nebulization Q2H PRN Segars, Christiane Ha, MD       apixaban Everlene Balls) tablet 5 mg  5 mg Oral BID Dolly Rias, MD   5 mg at 11/23/22 2258   ceFEPIme (MAXIPIME) 1 g in sodium chloride 0.9 %  100 mL IVPB  1 g Intravenous Q24H Dolly Rias, MD 200 mL/hr at 11/24/22 0110 1 g at 11/24/22 0110   doxercalciferol (HECTOROL) injection 4 mcg  4 mcg Intravenous Q M,W,F-HD Delano Metz, MD       DULoxetine (CYMBALTA) DR capsule 40 mg  40 mg Oral Daily Dolly Rias, MD   40 mg at 11/23/22 1048   HYDROmorphone (DILAUDID) tablet 2 mg  2 mg Oral TID PRN Dolly Rias, MD   2 mg at 11/23/22 1757   Or   HYDROmorphone (DILAUDID) tablet 4 mg  4 mg Oral TID PRN Dolly Rias, MD   4 mg at 11/24/22 0522   influenza vac split trivalent PF (FLULAVAL) injection 0.5 mL  0.5 mL Intramuscular Tomorrow-1000 Rhetta Mura, MD       insulin aspart (novoLOG) injection 0-15 Units  0-15 Units Subcutaneous TID WC Dolly Rias, MD   2 Units at 11/23/22 1153   insulin aspart (novoLOG) injection 10 Units  10 Units Subcutaneous TID WC Segars, Christiane Ha, MD       insulin glargine-yfgn (SEMGLEE) injection 40 Units  40 Units Subcutaneous Daily Dolly Rias, MD   40 Units at 11/23/22 1049   metroNIDAZOLE (FLAGYL) tablet 500 mg  500 mg Oral Q8H Dolly Rias, MD   500 mg at 11/24/22 0522   mirtazapine (REMERON SOL-TAB) disintegrating tablet 15 mg  15 mg Oral QHS Dolly Rias, MD   15 mg at 11/23/22 2311   ondansetron (ZOFRAN) injection 4 mg  4 mg Intravenous Q6H PRN Dolly Rias, MD       pantoprazole (PROTONIX) EC tablet 40 mg  40 mg Oral Daily Dolly Rias, MD    40 mg at 11/23/22 0913   sodium chloride flush (NS) 0.9 % injection 3 mL  3 mL Intravenous Q12H Dolly Rias, MD   3 mL at 11/23/22 2318   vancomycin variable dose per unstable renal function (pharmacist dosing)   Does not apply See admin instructions Arabella Merles, [facility]        REVIEW OF SYSTEMS:  [X]  denotes positive finding, [ ]  denotes negative finding Cardiac  Comments:  Chest pain or chest pressure:    [name] upon exertion: x   Short of breath when lying flat:    Irregular heart rhythm:        Vascular    Pain in calf, thigh, or hip brought on by ambulation:    Pain in feet at night that wakes you up from your sleep:     Blood clot in your veins:    Leg swelling:         Pulmonary    Oxygen at home:    Productive cough:     Wheezing:         Neurologic    Sudden weakness in arms or legs:     Sudden numbness in arms or legs:     Sudden onset of difficulty speaking or slurred speech:    Temporary loss of vision in one eye:     Problems with dizziness:         Gastrointestinal    Blood in stool:     Vomited blood:         Genitourinary    Burning when urinating:     Blood in urine:        Psychiatric    Major depression:         Hematologic    Bleeding problems:    Problems  with blood clotting too easily:        Skin    Rashes or ulcers: x       Constitutional    Fever or chills:    -  PHYSICAL EXAM:   Vitals:   11/23/22 2129 11/24/22 0022 11/24/22 0445 11/24/22 0624  BP: (!) 97/49 (!) 87/62  (!) 87/62  Pulse: 87 87  84  Resp: 20 16 20 20   Temp: 98.2 F (36.8 C) 97.8 F (36.6 C) 98 F (36.7 C) 97.8 F (36.6 C)  TempSrc: Oral Oral Oral   SpO2: 97% 100%  98%   There is no height or weight on file to calculate BMI. GENERAL: The patient is a morbidly obese female, in no acute distress. The vital signs are documented above. CARDIAC: There is a regular rate and rhythm.  VASCULAR: I do not detect carotid bruits. She has a palpable  thrill in her left upper arm fistula. On the right side, which is the site of concern, she has a palpable femoral pulse.  I cannot palpate popliteal or pedal pulses.  She has significant right lower extremity swelling.  She has a barely biphasic posterior tibial signal with a Doppler and a monophasic but brisk dorsalis pedis signal with the Doppler. On the left side, she has a palpable femoral pulse.  I cannot palpate pedal pulses.  She has a barely biphasic posterior tibial signal with a Doppler and a monophasic but brisk dorsalis pedis signal. PULMONARY: There is good air exchange bilaterally without wheezing or rales. ABDOMEN: Soft and non-tender with normal pitched bowel sounds.  MUSCULOSKELETAL: There are no major deformities. NEUROLOGIC: No focal weakness or paresthesias are detected. SKIN: She has a wound on her right heel as shown in the picture below.  Currently there is no significant drainage and currently this does not appear to be an obvious source for sepsis.  PSYCHIATRIC: The patient has a normal affect.  DATA:    ARTERIAL DOPPLER STUDY: Pending.  LABS: Potassium is 3.7.  White blood cell count is 30.  Waverly Ferrari [facility]

## 2022-11-23 NOTE — Progress Notes (Addendum)
57 year old black female wc bound baselinelives with Neice Atrial fibrillation CHADVASC >4/Eliquis CKD 4--fresenious [?  MWF?] Duodenal carcinoid resected 2021 Lumbar spinal stenosis previously wheelchair-bound-not an operative candidate per Dr. 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-has been transfused previously  Brought in from home with family with weakness wound x 3 days and generlaized malaise.  She tells me she hasn;t been able to walk ~ 4-5 days and started crying whehn I talk with her as she is worried about her lower extremity wounds when I tell her that she has some  She is quite massively volume overloaded and has at least grade 4 pitting edema and tells me that her last HD session was Wednesday 9/11 and they pulled "5 L" She is coherent awake alert at this time although a little emotional   BP 105/68   Pulse 86   Temp 97.9 F (36.6 C) (Oral)   Resp 16   LMP 10/10/2012   SpO2 100%  Chronically ill-appearing black female Thick neck Mallampati 4 Right chest access noted Chest clear no added sound anteriorly slight tachycardia Abdomen obese nontender no rebound Heel examined on the right side shows slightly wet calcaneal region She has grade 4 edema as above  Follow plan as according to admitting physician who saw the patient just 10 minutes before I did She does not necessarily require progressive and I will watch her pressures over the next hour and a half or so and she can probably transfer to telemetry She will need back-to-back dialysis likely to correct her metabolic abnormalities I think her lactic acidosis is probably from renal acidosis more than infectious etiology She has hypervolemic hyponatremia which will improve with dialysis Her anion gap was elevated probably because of both poorly controlled sugar (not DKA) but also renal acidosis and should  improve with HD  Aldactone torsemide metoprolol Imdur Lasix Coreg etc. etc. etc. all on hold because of hypotension-I am not sure she is even taking half of these meds and I will ask pharmacy to reconcile them  Continue Semglee insulin 40 units with sliding scale coverage  When mentation improves in a.m. please resume BuSpar 5 twice daily and Aricept if this is confirmed to be a home med by pharmacy-I would hold Remeron additionally  For A-fib can continue Eliquis 5 twice daily  I will follow-up and manage closely given her lab abnormalities and low blood pressure initially may warrant progressive but might not if she stabilizes--- await nephrology input, and await wound care input   Pleas Koch, MD [facility] 7:11 AM

## 2022-11-23 NOTE — Progress Notes (Signed)
Pharmacy Antibiotic Note  Deborah [name] is a 57 y.o. female admitted on 11/22/2022 with generalized weakness and concerns for sepsis.  Pharmacy has been consulted for vancomycin dosing. PMH includes ESRD (HD MWF- last session on 9/13).   WBC 30.3, sCr 10.14 (HD), lactate 3.8, afebrile, blood cultures pending Received: cefepime 2g x1, Vancomycin 2.5g x1, and flagyl 500mg  IV x1  Plan: Cefepime 1g IV every 24 hours per MD Vancomycin 2.5 g IV x1 Vanc per variable renal function until HD schedule is finalized, then 1g IV every post-HD days Follow up cultures, signs of clinical improvement, LOT, and de-escalation of antibiotics   Temp (24hrs), Avg:97.2 F (36.2 C), Min:97.2 F (36.2 C), Max:97.2 F (36.2 C)  Recent Labs  Lab 11/23/22 0023 11/23/22 0035 11/23/22 0217  WBC 30.3*  --   --   CREATININE 10.14*  --   --   LATICACIDVEN  --  4.4* 3.8*    CrCl cannot be calculated (Unknown ideal weight.).    Allergies  Allergen Reactions   Diazepam [name]   Gabapentin [name] and Swelling    Other reaction(s): Unknown   Iodinated Contrast Media Anaphylaxis and [name]    11/29/17 Cardiac arrest 1 min after IV contrast, possible allergy vs vasovagal episode  Iopamidol   Anaphylaxis   High 11/28/2017   Patient had seizure like activity and then code post 100 cc of isovue 300   Iopamidol Anaphylaxis    11/28/17 Patient had seizure like activity and then 1 min code after 100 cc of isovue 300. Possible contrast allergy vs vasovagal episode  Other Reaction(s): Cardiac Arrest   Lisinopril Anaphylaxis    Tongue and mouth swelling  Other Reaction(s): Laryngeal Edema   Metoclopramide Other (See Comments)    Tardive dyskinesia  Also known as Reglan  Other Reaction(s): Unknown   Nsaids Anaphylaxis and Other (See Comments)    ULCER  Other Reaction(s): Unknown   Penicillins Palpitations and Itching    Has patient had a PCN reaction causing  immediate rash, facial/tongue/throat swelling, SOB or lightheadedness with hypotension: Yes, heart races  Has patient had a PCN reaction causing severe rash involving mucus membranes or skin necrosis: No  Has patient had a PCN reaction that required hospitalization: Yes   Has patient had a PCN reaction occurring within the last 10 years: No  Other Reaction(s): Flushing (Red Skin), Laryngeal Edema   Chlorhexidine Dermatitis   Tolmetin Nausea Only, Other (See Comments) and Nausea And Vomiting    ULCER  Other Reaction(s): Unknown   Aspartame And Phenylalanine Hives   Aspirin Other (See Comments)    Irritates stomach ulcer    Dicyclomine Other (See Comments)    Chest pain  Other Reaction(s): Unknown   Rifamycin     Other Reaction(s): palpitations   Acetaminophen Nausea Only, Other (See Comments) and Nausea And Vomiting    Irritates stomach ulcer; Abdominal pain  Other Reaction(s): Abdominal Pain, ulcer   Cyclobenzaprine Palpitations    Other Reaction(s): palpitations   Oxycodone Palpitations    Other Reaction(s): palpitations   Rifamycins Palpitations   Tramadol Nausea And Vomiting    Other Reaction(s): palpitations    Antimicrobials this admission: Cefepime 9/17 >>  Vancomycin 9/17 >>  Flagyl 9/17 >>  Microbiology results: 9/17 BCx:   Thank you for allowing pharmacy to be a part of this patient's care.  Arabella Merles, PharmD. Clinical Pharmacist 11/23/2022 3:41 AM

## 2022-11-23 NOTE — ED Notes (Signed)
BG=[id number]

## 2022-11-23 NOTE — Progress Notes (Signed)
Reinforced right heal wound with kirlex and 2 4x4 in dialysis per Dr. Pandora Leiter request when he was rounding on patient.  Stacie Glaze, LPN-KDU

## 2022-11-23 NOTE — H&P (Signed)
History and Physical    [name]:[id number] DOB: Aug 21, 1965 DOA: 11/22/2022  PCP: Pcp, No   Patient coming from: Home   Chief Complaint:  Chief Complaint  Patient presents with   Weakness    HPI:  Deborah [name] is a 57 y.o. female with hx of ESRD on Monday Wednesday Friday dialysis, malignant carcinoid, A-fib, HFpEF, hypertension, diabetes, NAFLD, who presented to the ED with 1 week progressive fatigue.  She is a poor historian, and somnolent at time of interview.  Reports recent nausea, vomiting times few times per day, and diarrhea also few times per day, which has been worse in the past week.  However she has chronic diarrhea.  No sick contacts.  Denies any fevers, chills, cough cold symptoms, abdominal pain, dysuria (makes minimal urine).  She does have chronic wounds near her sacrum, and at the right heel.   Review of Systems:  ROS complete and negative except as marked above   Allergies  Allergen Reactions   Diazepam [name]   Gabapentin [name] and Swelling    Other reaction(s): Unknown   Iodinated Contrast Media Anaphylaxis and [name]    11/29/17 Cardiac arrest 1 min after IV contrast, possible allergy vs vasovagal episode  Iopamidol   Anaphylaxis   High 11/28/2017   Patient had seizure like activity and then code post 100 cc of isovue 300   Iopamidol Anaphylaxis    11/28/17 Patient had seizure like activity and then 1 min code after 100 cc of isovue 300. Possible contrast allergy vs vasovagal episode  Other Reaction(s): Cardiac Arrest   Lisinopril Anaphylaxis    Tongue and mouth swelling  Other Reaction(s): Laryngeal Edema   Metoclopramide Other (See Comments)    Tardive dyskinesia  Also known as Reglan  Other Reaction(s): Unknown   Nsaids Anaphylaxis and Other (See Comments)    ULCER  Other Reaction(s): Unknown   Penicillins Palpitations and Itching    Has patient had a PCN reaction causing  immediate rash, facial/tongue/throat swelling, SOB or lightheadedness with hypotension: Yes, heart races  Has patient had a PCN reaction causing severe rash involving mucus membranes or skin necrosis: No  Has patient had a PCN reaction that required hospitalization: Yes   Has patient had a PCN reaction occurring within the last 10 years: No  Other Reaction(s): Flushing (Red Skin), Laryngeal Edema   Chlorhexidine Dermatitis   Tolmetin Nausea Only, Other (See Comments) and Nausea And Vomiting    ULCER  Other Reaction(s): Unknown   Aspartame And Phenylalanine Hives   Aspirin Other (See Comments)    Irritates stomach ulcer    Dicyclomine Other (See Comments)    Chest pain  Other Reaction(s): Unknown   Rifamycin     Other Reaction(s): palpitations   Acetaminophen Nausea Only, Other (See Comments) and Nausea And Vomiting    Irritates stomach ulcer; Abdominal pain  Other Reaction(s): Abdominal Pain, ulcer   Cyclobenzaprine Palpitations    Other Reaction(s): palpitations   Oxycodone Palpitations    Other Reaction(s): palpitations   Rifamycins Palpitations   Tramadol Nausea And Vomiting    Other Reaction(s): palpitations    Prior to Admission medications   Medication Sig Start Date End Date Taking? Authorizing Provider  albuterol (PROVENTIL) (2.5 MG/3ML) 0.083% nebulizer solution Take 3 mLs (2.5 mg total) by nebulization every 6 (six) hours as needed for wheezing or shortness of breath. 04/06/19   Bing Neighbors, NP  allopurinol (ZYLOPRIM) 100 MG tablet Take  by mouth. 08/05/22   [provider]  amLODipine (NORVASC) 10 MG tablet Take by mouth. 08/16/22   [provider]  apixaban (ELIQUIS) 5 MG TABS tablet Take 5 mg by mouth 2 (two) times daily. 12/03/21   [provider]  atorvastatin (LIPITOR) 10 MG tablet Take 10 mg by mouth at bedtime. 12/03/21   [provider]  AURYXIA 1 GM 210 MG(Fe) tablet Take by mouth. 08/09/22   [provider]   busPIRone (BUSPAR) 5 MG tablet Take 5 mg by mouth 2 (two) times daily.    [provider]  carvedilol (COREG) 12.5 MG tablet Take 1 tablet (12.5 mg total) by mouth 2 (two) times daily with a meal. 06/04/22   Leroy Sea, MD  cetirizine (ZYRTEC) 10 MG tablet Take by mouth. 08/05/22   [provider]  colchicine 0.6 MG tablet Take 0.5 tablets (0.3 mg total) by mouth 2 (two) times a week. 07/15/22   Zannie Cove, MD  donepezil (ARICEPT) 5 MG tablet Take by mouth. 08/05/22   [provider]  Doxercalciferol (HECTOROL IV) Doxercalciferol (Hectorol) 08/20/22 08/19/23  [provider]  doxycycline (VIBRAMYCIN) 100 MG capsule Take 1 capsule (100 mg total) by mouth 2 (two) times daily. 10/19/22   Terrilee Files, MD  DULoxetine HCl 40 MG CPEP Take by mouth. 08/05/22   [provider]  EASY COMFORT PEN NEEDLES 31G X 5 MM MISC USE 3 TIMES A DAY FOR INSULIN ADMINISTRATION 11/14/19   Meccariello, Solmon Ice, MD  famotidine (PEPCID) 20 MG tablet Take 1 tablet (20 mg total) by mouth daily. 03/22/22 07/20/22  Cora Collum, DO  ferrous sulfate 325 (65 FE) MG tablet Take 1 tablet (325 mg total) by mouth 2 (two) times daily with a meal. 05/27/22   Kathlen Mody, MD  fluticasone (FLONASE) 50 MCG/ACT nasal spray Place 2 sprays into both nostrils daily as needed for allergies or rhinitis. Patient taking differently: Place 1 spray into both nostrils daily as needed for allergies. 12/19/18   Rai, Delene Ruffini, MD  folic acid (FOLVITE) 1 MG tablet Take 1 mg by mouth daily. 04/07/21   [provider]  furosemide (LASIX) 80 MG tablet Take 1 tablet (80 mg total) by mouth daily. 07/06/22   Rhetta Mura, MD  HYDROmorphone (DILAUDID) 4 MG tablet Take 1 tablet (4 mg total) by mouth 3 (three) times daily as needed for moderate pain. Three times a day as needed 20 days out of the month. 11/18/22   Jones Bales, NP  insulin aspart (NOVOLOG) 100 UNIT/ML injection Inject 5  Units into the skin 3 (three) times daily with meals. 06/04/22   Leroy Sea, MD  insulin glargine-yfgn (SEMGLEE) 100 UNIT/ML injection Inject 0.15 mLs (15 Units total) into the skin 2 (two) times daily. 07/05/22   Rhetta Mura, MD  insulin lispro (HUMALOG) 100 UNIT/ML KwikPen Before each meal 3 times a day, 140-199 - 2 units, 200-250 - 6 units, 251-299 - 8 units,  300-349 - 12 units,  350 or above 14 units. Patient taking differently: Inject 2-14 Units into the skin See admin instructions. Before each meal and at bedtime. 140-199 - 2 units, 200-250 - 6 units, 251-299 - 8 units,  300-349 - 12 units,  350 or above 14 units. 06/04/22   Leroy Sea, MD  insulin lispro (HUMALOG) 100 UNIT/ML KwikPen Inject into the skin. 08/30/22   [provider]  isosorbide mononitrate (IMDUR) 30 MG 24 hr tablet Take  by mouth. 08/05/22   [provider]  isosorbide mononitrate (IMDUR) 60 MG 24 hr tablet Take 1 tablet by mouth daily. 08/31/22 08/31/23  [provider]  LANTUS SOLOSTAR 100 UNIT/ML Solostar Pen Inject into the skin. 09/10/20   [provider]  loperamide (IMODIUM A-D) 2 MG tablet Take 2 mg by mouth every 6 (six) hours as needed for diarrhea or loose stools.    [provider]  melatonin 3 MG TABS tablet Take by mouth. 10/21/20   [provider]  Methoxy PEG-Epoetin Beta (MIRCERA IJ) Mircera 08/06/22 08/05/23  [provider]  metoprolol succinate (TOPROL-XL) 50 MG 24 hr tablet Take by mouth. 10/21/20   [provider]  mirtazapine (REMERON SOL-TAB) 15 MG disintegrating tablet Take 1 tablet (15 mg total) by mouth at bedtime. 07/05/22   Rhetta Mura, MD  Multiple Vitamin (MULTIVITAMIN WITH MINERALS) TABS tablet Take 1 tablet by mouth daily. 05/28/22   Kathlen Mody, MD  omeprazole (PRILOSEC) 20 MG capsule Take by mouth. 08/05/22   [provider]  ondansetron (ZOFRAN ODT) 4 MG disintegrating tablet Take 1 tablet (4 mg  total) by mouth every 8 (eight) hours as needed for nausea or vomiting. 05/30/21   [name], MD  pantoprazole (PROTONIX) 40 MG tablet TAKE 1 TABLET BY MOUTH 2 (TWO) TIMES DAILY. (AM+BEDTIME) 08/30/22   Raulkar, Drema Pry, MD  spironolactone (ALDACTONE) 25 MG tablet Take 12.5 mg by mouth daily. 08/30/22   [provider]  torsemide (DEMADEX) 100 MG tablet  08/31/22   [provider]  torsemide (DEMADEX) 20 MG tablet TAKE 1 TABLET DAILY AS NEEDED FOR LEG EDEMA/SWELLING (AM) 08/31/22   [provider]  Vitamin D, Ergocalciferol, (DRISDOL) 1.25 MG (50000 UNIT) CAPS capsule Take 50,000 Units by mouth once a week. 04/07/21   [provider]    Past Medical History:  Diagnosis Date   Acute back pain with sciatica, left    Acute back pain with sciatica, right    AKI (acute kidney injury) (HCC)    Anemia, unspecified    Cancer (HCC)    Carcinoid tumor of duodenum    Chest pain with normal coronary angiography 2019   Chronic kidney disease, stage 3b (HCC)    Chronic pain    Chronic systolic CHF (congestive heart failure) (HCC)    Diabetes mellitus    DKA (diabetic ketoacidosis) (HCC)    Drug-seeking behavior    21 hospitalizations and 14 CT a/p in 2 years for N/V and abdominal pain, demanding only IV dilaudid   Elevated troponin    chronic   Esophageal reflux    Fibromyalgia    Gastric ulcer    Gastroparesis    Gout    Hyperlipidemia    Hypertension    Hypokalemia    Hypomagnesemia    Lumbosacral stenosis    LVH (left ventricular hypertrophy)    Morbid obesity (HCC)    NICM (nonischemic cardiomyopathy) (HCC)    PAF (paroxysmal atrial fibrillation) (HCC)    Stroke (HCC) 02/2011   Thrombocytosis    Vitamin B12 deficiency anemia     Past Surgical History:  Procedure Laterality Date   AV FISTULA PLACEMENT Left 06/30/2022   Procedure: LEFT BRACHIOCEPHALIC ARTERIOVENOUS (AV) FISTULA CREATION;  Surgeon: Cephus Shelling, MD;  Location: [facility] OR;   Service: Vascular;  Laterality: Left;   BIOPSY  07/27/2019   Procedure: BIOPSY;  Surgeon: Vida Rigger, MD;  Location: [facility] ENDOSCOPY;  Service: Endoscopy;;   BIOPSY  07/30/2019  Procedure: BIOPSY;  Surgeon: Kathi Der, MD;  Location: [facility];  Service: Gastroenterology;;   CATARACT EXTRACTION  01/2014   CHOLECYSTECTOMY     COLONOSCOPY WITH PROPOFOL N/A 07/30/2019   Procedure: COLONOSCOPY WITH PROPOFOL;  Surgeon: Kathi Der, MD;  Location: [facility];  Service: Gastroenterology;  Laterality: N/A;   ESOPHAGOGASTRODUODENOSCOPY N/A 07/27/2019   Procedure: ESOPHAGOGASTRODUODENOSCOPY (EGD);  Surgeon: Vida Rigger, MD;  Location: [facility];  Service: Endoscopy;  Laterality: N/A;   ESOPHAGOGASTRODUODENOSCOPY N/A 07/26/2020   Procedure: ESOPHAGOGASTRODUODENOSCOPY (EGD);  Surgeon: Willis Modena, MD;  Location: [facility];  Service: Endoscopy;  Laterality: N/A;   ESOPHAGOGASTRODUODENOSCOPY (EGD) WITH PROPOFOL N/A 08/02/2019   Procedure: ESOPHAGOGASTRODUODENOSCOPY (EGD) WITH PROPOFOL;  Surgeon: Kathi Der, MD;  Location: [facility];  Service: Gastroenterology;  Laterality: N/A;   HEMOSTASIS CLIP PLACEMENT  08/02/2019   Procedure: HEMOSTASIS CLIP PLACEMENT;  Surgeon: Kathi Der, MD;  Location: [facility];  Service: Gastroenterology;;   IR FLUORO GUIDE CV LINE RIGHT  06/24/2022   IR US GUIDE VASC ACCESS RIGHT  06/24/2022   POLYPECTOMY  07/30/2019   Procedure: POLYPECTOMY;  Surgeon: Kathi Der, MD;  Location: [facility];  Service: Gastroenterology;;   POLYPECTOMY  08/02/2019   Procedure: POLYPECTOMY;  Surgeon: Kathi Der, MD;  Location: [facility];  Service: Gastroenterology;;     reports that she has never smoked. She has never used smokeless tobacco. She reports that she does not drink alcohol and does not use drugs.  Family History  Problem Relation Age of Onset   Diabetes Mother    Diabetes Father    Heart disease Father    Diabetes Sister     Congestive Heart Failure Sister 10   Diabetes Brother      Physical Exam: Vitals:   11/23/22 0430 11/23/22 0445 11/23/22 0515 11/23/22 0518  BP: 100/60 91/67 (!) 107/59   Pulse: 85 84 86   Resp: (!) 25 (!) 21 (!) 22   Temp:    97.9 F (36.6 C)  TempSrc:    Oral  SpO2: 98% 99% 100%     Gen: Somnolent arouses to light nonpainful stimuli and quickly falls back asleep., acutely and chronically ill-appearing CV: Regular, normal S1, S2, 1/6 SEM Resp: Normal WOB, CTAB  Abd: Obese , normoactive, there is mild diffuse tenderness MSK: Symmetric, there is 1+ edema near the ankles Skin: See ED course for sacral wounds, there is also a wound on the right heel with slough and eschar.  Neuro: Somnolent but arousable to light, nonpainful stimuli, oriented Psych: Insight appears limited   Data review:   Labs reviewed, notable for:  ABG 7.42/29, bicarb 16, anion gap 25 Lactate 4.4, down to 1.7 with fluid NA 127 Creatinine consistent with ESRD AST 47 T. bili 1.3 BNP 120 which is down from prior High-sensitivity Trop 20 down to 16    Micro:  Results for orders placed or performed during the hospital encounter of 11/22/22  Resp panel by RT-PCR (RSV, Flu A&B, Covid) Anterior Nasal Swab     Status: None   Collection Time: 11/23/22 12:23 AM   Specimen: Anterior Nasal Swab  Result Value Ref Range Status   SARS Coronavirus 2 by RT PCR NEGATIVE NEGATIVE Final   Influenza A by PCR NEGATIVE NEGATIVE Final   Influenza B by PCR NEGATIVE NEGATIVE Final    Comment: (NOTE) The Xpert Xpress SARS-CoV-2/FLU/RSV plus assay is intended as an aid in the diagnosis of influenza from Nasopharyngeal swab specimens and should not be used as a sole  basis for treatment. Nasal washings and aspirates are unacceptable for Xpert Xpress SARS-CoV-2/FLU/RSV testing.  Fact Sheet for Patients: BloggerCourse.com  Fact Sheet for Healthcare  Providers: SeriousBroker.it  This test is not yet approved or cleared by the [place] FDA and has been authorized for detection and/or diagnosis of SARS-CoV-2 by FDA under an Emergency Use Authorization (EUA). This EUA will remain in effect (meaning this test can be used) for the duration of the COVID-19 declaration under Section 564(b)(1) of the Act, 21 U.S.C. section 360bbb-3(b)(1), unless the authorization is terminated or revoked.     Resp Syncytial Virus by PCR NEGATIVE NEGATIVE Final    Comment: (NOTE) Fact Sheet for Patients: BloggerCourse.com  Fact Sheet for Healthcare Providers: SeriousBroker.it  This test is not yet approved or cleared by the [place] FDA and has been authorized for detection and/or diagnosis of SARS-CoV-2 by FDA under an Emergency Use Authorization (EUA). This EUA will remain in effect (meaning this test can be used) for the duration of the COVID-19 declaration under Section 564(b)(1) of the Act, 21 U.S.C. section 360bbb-3(b)(1), unless the authorization is terminated or revoked.  Performed at [facility], [address] [place], [place] [address]     Imaging reviewed:  US Abdomen Limited RUQ (LIVER/GB)  Result Date: 11/23/2022 CLINICAL DATA:  History of prior cholecystectomy with elevated BUN EXAM: ULTRASOUND ABDOMEN LIMITED RIGHT UPPER QUADRANT COMPARISON:  None Available. FINDINGS: Gallbladder: Surgically removed Common bile duct: Diameter: 3.6 mm Liver: No focal lesion identified. Within normal limits in parenchymal echogenicity. Portal vein is patent on color Doppler imaging with normal direction of blood flow towards the liver. Other: None. IMPRESSION: No acute abnormality noted. Electronically Signed   By: Alcide Clever M.D.   On: 11/23/2022 03:02   DG Chest Port 1 View  Result Date: 11/23/2022 CLINICAL DATA:  Sepsis, weakness EXAM: PORTABLE CHEST 1  VIEW COMPARISON:  10/19/2022 FINDINGS: Lungs are clear.  No pleural effusion or pneumothorax. The heart is top-normal in size. Right IJ dual lumen dialysis catheter terminating at the cavoatrial junction. IMPRESSION: No acute cardiopulmonary disease. Electronically Signed   By: Charline Bills M.D.   On: 11/23/2022 00:41    EKG: Sinus rhythm with first-degree AV block, QTc is 488.  No acute ischemic changes  ED Course:  Treated with 1 L IV fluid, vancomycin, cefepime, Flagyl.,  At my request ED discussed her case with nephrology to see about urgency of dialysis.  And we reviewed her case with PCCM who feels she is suitable for progressive at this time   Assessment/Plan:  57 y.o. female with hx ESRD on Monday Wednesday Friday dialysis, malignant carcinoid, A-fib, HFpEF, hypertension, diabetes, NAFLD, who presented to the ED with 1 week progressive fatigue.  Found to be septic with source possible skin and soft tissue infection from chronic wounds.  Sepsis, present on admission, worse possibly skin and soft tissue infection Resolving mixed septic and hypovolemic shock Hypovolemia in setting of recent nausea vomiting diarrhea Lactic acidosis -resolved with IV fluids Sepsis criteria: Leukocytosis, tachypnea. source possibly sacral or heel wounds.  Possibly GI translocation with her history of metastatic carcinoid.  -Follow-up blood cultures -Currently on vancomycin pharmacy to dose, cefepime 1 g IV every 24 hours, Flagyl 500 mg p.o. every 8 hours -Status post approximately 2 L of IV fluids (1.5 L bolus, and 4 hours 150 mL rate which was interrupted by boluses).  Initial pace of fluid resuscitation was conservative due to her history of end-stage renal  disease and HFpEF -Lactate has normalized, stop trend -Wound care consult -Management of nausea vomiting and diarrhea per below  N/V/D Question related to metastatic carcinoid, versus gastroenteritis -If continued diarrhea here would obtain a  stool sample for GI pathogen and C. difficile.  If negative then would start on loperamide -Zofran IV as needed for nausea, cautious with borderline QTc 488  ESRD -Nephrology was consulted by the ED, no urgent indication for dialysis.  They will follow -Appears home diuretic dose is Torsemide 50 mg BID per last fill, hold for now with sepsis.   Hyponatremia Unclear, does not appear hypervolemic.  Could be hypovolemic with low solute.  Possible SIADH with her GI illness -Check serum osm, urine osm, urine sodium  Acute myocardial injury EKG without ischemic changes.  Minimal elevation in high-sensitivity Trop to 20.  Likely demand in the setting of her sepsis  Acute liver injury mixed pattern -Right upper quadrant ultrasound without abnormality.  Question if LFT may be in the setting of her carcinoid.   Acute on chronic anemia Fluctuating baseline, here 7.7.  No history of recent bleeding.  Suspect this is in the setting of her chronic kidney disease. -Trend CBC  DM type 2 with hyperglycemia  Home regimen is Lantus 50 units in the evening, and Humalog 30 units TID with meals,  - here will sub for Semglee and reduce to 40 units (change to daily with missed dose in the evening).  - Add scheduled Aspart 10 units TID with meals, and SSI for moderate  Chronic medical problems: Med management: Attention to med rec, has not been completed by pharmacy yet.  Metastatic carcinoid: Per chart review possible hx resection, unclear if on somatostatin injections or other treatment.  Afib: Continue home Apixaban 5 mg BID, hold coreg for now I/s/o sepsis  HFpEF: See diuretic management in ESRD above  Chronic cancer pain: Continue home Dilaudid, offer 2 v 4 mg PO prn q 8 hr (home is 4mg )  HTN: Hold home Amlodipine (doubt still taking), Carvedilol, ISMN, Hydralazine, Spironolactone  HLD, NAFLD: Continue home Atorvastatin  Mood d/o: Continue home Duloxetine, Mirtazapine  GERD: PPI    There is no  height or weight on file to calculate BMI.    DVT prophylaxis:   On home apixaban  Code Status:  Full Code Diet:  Diet Orders (From admission, onward)     Start     Ordered   11/23/22 0327  Diet renal/carb modified with fluid restriction Diet-HS Snack? Nothing; Fluid restriction: 1200 mL Fluid; Room service appropriate? Yes; Fluid consistency: Thin  Diet effective now       Question Answer Comment  Diet-HS Snack? Nothing   Fluid restriction: 1200 mL Fluid   Room service appropriate? Yes   Fluid consistency: Thin      11/23/22 0332           Family Communication:  No   Consults:  Nephrology  Admission status:   Inpatient, Step Down Unit  Severity of Illness: The appropriate patient status for this patient is INPATIENT. Inpatient status is judged to be reasonable and necessary in order to provide the required intensity of service to ensure the patient's safety. The patient's presenting symptoms, physical exam findings, and initial radiographic and laboratory data in the context of their chronic comorbidities is felt to place them at high risk for further clinical deterioration. Furthermore, it is not anticipated that the patient will be medically stable for discharge from the hospital within 2 midnights of  admission.   * I certify that at the point of admission it is my clinical judgment that the patient will require inpatient hospital care spanning beyond 2 midnights from the point of admission due to high intensity of service, high risk for further deterioration and high frequency of surveillance required.*   Dolly Rias, MD [facility]  How to contact the [facility] Attending or Consulting provider 7A - 7P or covering provider during after hours 7P -7A, for this patient.  Check the care team in [facility] and look for a) attending/consulting [facility] provider listed and b) the [facility] team listed Log into [email or web address] and use [place]'s universal password to access. If you do not have  the password, please contact the hospital operator. Locate the [facility] provider you are looking for under [facility] and page to a number that you can be directly reached. If you still have difficulty reaching the provider, please page the [facility] (Director on Call) for the Hospitalists listed on amion for assistance.  11/23/2022, 6:43 AM

## 2022-11-24 ENCOUNTER — Inpatient Hospital Stay (HOSPITAL_COMMUNITY): Payer: 59

## 2022-11-24 DIAGNOSIS — L97411 Non-pressure chronic ulcer of right heel and midfoot limited to breakdown of skin: Secondary | ICD-10-CM

## 2022-11-24 DIAGNOSIS — M6289 Other specified disorders of muscle: Secondary | ICD-10-CM

## 2022-11-24 DIAGNOSIS — N186 End stage renal disease: Secondary | ICD-10-CM | POA: Diagnosis not present

## 2022-11-24 DIAGNOSIS — E1151 Type 2 diabetes mellitus with diabetic peripheral angiopathy without gangrene: Secondary | ICD-10-CM

## 2022-11-24 DIAGNOSIS — I10 Essential (primary) hypertension: Secondary | ICD-10-CM | POA: Diagnosis not present

## 2022-11-24 DIAGNOSIS — A419 Sepsis, unspecified organism: Secondary | ICD-10-CM | POA: Diagnosis not present

## 2022-11-24 LAB — GLUCOSE, CAPILLARY
Glucose-Capillary: 93 mg/dL (ref 70–99)
Glucose-Capillary: 76 mg/dL (ref 70–99)
Glucose-Capillary: 75 mg/dL (ref 70–99)

## 2022-11-24 LAB — HEMOGLOBIN AND HEMATOCRIT, BLOOD
HCT: 25.4 % — ABNORMAL LOW (ref 36.0–46.0)
Hemoglobin: 7.6 g/dL — ABNORMAL LOW (ref 12.0–15.0)

## 2022-11-24 LAB — SEDIMENTATION RATE: Sed Rate: 122 mm/h — ABNORMAL HIGH (ref 0–22)

## 2022-11-24 LAB — PREPARE RBC (CROSSMATCH)

## 2022-11-24 LAB — C-REACTIVE PROTEIN: CRP: 21.1 mg/dL — ABNORMAL HIGH (ref <1.0)

## 2022-11-24 MED ORDER — HEPARIN (PORCINE) 25000 UT/250ML-% IV SOLN
1500.0000 [IU]/h | INTRAVENOUS | Status: AC
  Administered 2022-11-24: 1300 [IU]/h via INTRAVENOUS
  Filled 2022-11-24 (×2): qty 250

## 2022-11-24 MED ORDER — FERRIC CITRATE 1 GM 210 MG(FE) PO TABS
420.0000 mg | ORAL_TABLET | Freq: Three times a day (TID) | ORAL | Status: DC
  Administered 2022-11-25 – 2022-11-30 (×9): 420 mg via ORAL
  Filled 2022-11-24 (×14): qty 2

## 2022-11-24 MED ORDER — HEPARIN SODIUM (PORCINE) 1000 UNIT/ML IJ SOLN
3800.0000 [IU] | Freq: Once | INTRAMUSCULAR | Status: AC
  Administered 2022-11-24: 3800 [IU]

## 2022-11-24 MED ORDER — HEPARIN SODIUM (PORCINE) 1000 UNIT/ML IJ SOLN: 3800.0000 [IU] | Freq: Once | INTRAMUSCULAR | Status: DC

## 2022-11-24 MED ORDER — MIDODRINE HCL 5 MG PO TABS
5.0000 mg | ORAL_TABLET | Freq: Three times a day (TID) | ORAL | Status: DC
  Administered 2022-11-24 – 2022-11-30 (×14): 5 mg via ORAL
  Filled 2022-11-24 (×15): qty 1

## 2022-11-24 MED ORDER — VANCOMYCIN HCL IN DEXTROSE 1-5 GM/200ML-% IV SOLN
1000.0000 mg | INTRAVENOUS | Status: DC
  Administered 2022-11-24: 1000 mg via INTRAVENOUS
  Filled 2022-11-24: qty 200

## 2022-11-24 MED ORDER — SODIUM CHLORIDE 0.9% IV SOLUTION: Freq: Once | INTRAVENOUS | Status: AC

## 2022-11-24 MED ORDER — MEDIHONEY WOUND/BURN DRESSING EX PSTE
1.0000 | PASTE | Freq: Every day | CUTANEOUS | Status: DC
  Administered 2022-11-24 – 2022-11-30 (×7): 1 via TOPICAL
  Filled 2022-11-24 (×2): qty 44

## 2022-11-24 NOTE — Progress Notes (Signed)
ABI has been completed.  Exam somewhat limited due to patient condition.   Results can be found under chart review under CV PROC. 11/24/2022 1:15 PM Nehemiah Mcfarren RVT, RDMS

## 2022-11-24 NOTE — Consult Note (Signed)
WOC Nurse Consult Note: Reason for Consult: re-evaluation with images of the right heel added to the chart.  Patient has been seen by both vascular and ortho at this time. They will manage wound of the right heel. Pending MRI to rule out osteo.  Wound type:Unstageable Pressure Injury; right heel in the presence of PAD Pressure Injury POA: Yes Measurement:see nursing flow sheet Wound bed: 50% eschar/50% pink Drainage (amount, consistency, odor) none documented  Periwound: intact  Dressing procedure/placement/frequency: Pending ortho and VVS work up and intervention, will keep wound care with silver to manage exudate (per admitting MD) for now.   Will sign off Dr. Lajoyce Corners and VVS to manage wound on the right heel and topical care if needed.   Emonie Espericueta [facility], CNS, [employer] [phone]

## 2022-11-24 NOTE — Progress Notes (Signed)
[facility] Progress Note   Subjective: Seen in room. Completed dialysis last night. Tired this am. Denies chest pain, dyspnea.   Objective Vitals:   11/24/22 0022 11/24/22 0445 11/24/22 0624 11/24/22 0802  BP: (!) 87/62  (!) 87/62 (!) 103/49  Pulse: 87  84 78  Resp: 16 20 20 18   Temp: 97.8 F (36.6 C) 98 F (36.7 C) 97.8 F (36.6 C) 97.8 F (36.6 C)  TempSrc: Oral Oral  Oral  SpO2: 100%  98% 90%  Weight:    133 kg      Additional Objective Labs: Basic Metabolic Panel: Recent Labs  Lab 11/23/22 0023 11/23/22 0218 11/23/22 1545 11/24/22 0513  NA 127* 127*  --  131*  K 4.0 3.7  --  4.1  CL 86*  --   --  95*  CO2 16*  --   --  25  GLUCOSE 270*  --   --  87  BUN 68*  --   --  29*  CREATININE 10.14*  --   --  5.01*  CALCIUM 9.3  --   --  7.9*  PHOS  --   --  8.4*  --    CBC: Recent Labs  Lab 11/23/22 0023 11/23/22 0218 11/24/22 0513  WBC 30.3*  --  19.8*  NEUTROABS 27.1*  --  17.0*  HGB 7.7* 7.8* 6.9*  HCT 26.6* 23.0* 23.4*  MCV 91.7  --  90.7  PLT 345  --  340   Blood Culture    Component Value Date/Time   SDES BLOOD SITE NOT SPECIFIED 11/23/2022 0023   SPECREQUEST  11/23/2022 0023    BOTTLES DRAWN AEROBIC AND ANAEROBIC Blood Culture adequate volume   CULT  11/23/2022 0023    NO GROWTH 1 DAY Performed at [facility], [address], [place], [place] [address]    REPTSTATUS PENDING 11/23/2022 0023     Physical Exam General: Lying in bed, nad Heart: Regular rate Lungs: Bilateral breath sounds Abdomen: soft non -tender Extremities: trace LE edema Dialysis Access: R TDC  Medications:  ceFEPime (MAXIPIME) IV 1 g (11/24/22 0110)   heparin     vancomycin      sodium chloride   Intravenous Once   doxercalciferol  4 mcg Intravenous Q M,W,F-HD   DULoxetine  40 mg Oral Daily   influenza vac split trivalent PF  0.5 mL Intramuscular Tomorrow-1000   insulin aspart  0-15 Units Subcutaneous TID WC   insulin aspart  10 Units  Subcutaneous TID WC   insulin glargine-yfgn  40 Units Subcutaneous Daily   leptospermum manuka honey  1 Application Topical Daily   metroNIDAZOLE  500 mg Oral Q8H   midodrine  5 mg Oral TID WC   mirtazapine  15 mg Oral QHS   pantoprazole  40 mg Oral Daily   sodium chloride flush  3 mL Intravenous Q12H   vancomycin variable dose per unstable renal function (pharmacist dosing)   Does not apply See admin instructions      06/30/2022 -- > Dr Chestine Spore of [facility] did L BC AVF placement     OP HD: [facility] MWF  4h  400/800    126.9kg  RIJ TDC/  LUA AVF (maturing) Hep 5000+ - hectorol 7 mcg IV tiw - venofer 50mg  weekly IV - mircera 225 mcg IV q 2 wks, last 9/06, due 9/20     Assessment/ Plan: Sepsis/ SSTI - w/ vol depletion and hypotension. Responding to IV abx  and IV fluids. Blood cx neg to date.  H/o metastatic carcinoid tumor ESRD - on HD MWF. Missed treatments. HD yesterday. Back on schedule. HD later today H/o HTN - BP's soft, hold all home HTN meds Volume - BP's soft due to #1. Got 2L bolus in ED. Volume up on exam, will plan UF 2-2.5 L as tolerated.  Anemia esrd - Hb 6.9. For transfusion today.  Next  esa is due on 9/27. Follow.  MBD ckd - CCa in range. Phos above goal. Cont Auryxia binder Hyponatremia - due to vol overload, see above H/o afib H/o chronic pain Deconditioning - pt is WC to chair/ bed dependent  Deborah Blase PA-C [facility] 11/24/2022,12:02 PM

## 2022-11-24 NOTE — Progress Notes (Signed)
HD Progress Note:   11/24/22 1845  Vitals  BP (!) 96/49  MAP (mmHg) (!) 62  Pulse Rate 79  ECG Heart Rate 80  Resp (!) 25  Oxygen Therapy  SpO2 100 %  During Treatment Monitoring  Blood Flow Rate (mL/min) 299 mL/min  Arterial Pressure (mmHg) -115.75 mmHg  Venous Pressure (mmHg) 135.35 mmHg  TMP (mmHg) 6.66 mmHg  Ultrafiltration Rate (mL/min) 0 mL/min  Dialysate Flow Rate (mL/min) 300 ml/min  Dialysate Potassium Concentration 3  Dialysate Calcium Concentration 2.5  Duration of HD Treatment -hour(s) 2.98 hour(s)  Cumulative Fluid Removed (mL) per Treatment  2060.7  HD Safety Checks Performed Yes  Intra-Hemodialysis Comments Tx completed  Dialysis Fluid Bolus Normal Saline  Bolus Amount (mL) 300 mL  Post Treatment  Dialyzer Clearance Lightly streaked  Hemodialysis Intake (mL) 3000 mL  Liters Processed 60.5  Fluid Removed (mL) 1760 mL  Tolerated HD Treatment No (Comment)  Post-Hemodialysis Comments Unable to removed UF of 2.5L as ordered due to hypotension.  Hemodialysis Catheter Right Subclavian Double lumen Permanent (Tunneled)  Placement Date/Time: 06/24/22 1644   Serial / Lot #: [id number]  Expiration Date: 09/08/26  Time Out: Correct patient;Correct site;Correct procedure  Maximum sterile barrier precautions: Hand hygiene;Mask;Cap;Sterile gloves;Sterile gown;Large sterile ...  Site Condition No complications  Blue Lumen Status Flushed;Heparin locked;Blood return noted  Red Lumen Status Flushed;Heparin locked;Dead end cap in place  Purple Lumen Status N/A  Catheter fill solution Heparin 1000 units/ml  Catheter fill volume (Arterial) 1.9 cc  Catheter fill volume (Venous) 1.9  Dressing Type Transparent  Dressing Status Antimicrobial disc in place  Interventions Dressing reinforced  Drainage Description None  Dressing Change Due 11/30/22  Post treatment catheter status Capped and Clamped

## 2022-11-24 NOTE — Progress Notes (Signed)
Patient returned to 5W-36 around 2000. Upon arrival, patient was yelling out that her bed was broken (low air mattress) and was requesting a new regular bed immediately. Patient placed on regular bed, per patients request. Dressing change to sacrum , thigh and heel performed. Upon chart review, pt never received a follow up H&H following blood administration this evening. STAT H&H order placed. Will continue to monitor.

## 2022-11-24 NOTE — Plan of Care (Signed)
  Problem: Coping: Goal: Ability to adjust to condition or change in health will improve Outcome: Progressing   Problem: Fluid Volume: Goal: Ability to maintain a balanced intake and output will improve Outcome: Progressing   Problem: Metabolic: Goal: Ability to maintain appropriate glucose levels will improve Outcome: Progressing   Problem: Skin Integrity: Goal: Risk for impaired skin integrity will decrease Outcome: Progressing   Problem: Tissue Perfusion: Goal: Adequacy of tissue perfusion will improve Outcome: Progressing   Problem: Nutrition: Goal: Adequate nutrition will be maintained Outcome: Progressing

## 2022-11-24 NOTE — Progress Notes (Signed)
ANTICOAGULATION CONSULT NOTE - Follow Up Consult  Pharmacy Consult for Heparin Indication: atrial fibrillation (apixaban on hold)  Allergies  Allergen Reactions   Diazepam [name]   Gabapentin [name] and Swelling    Other reaction(s): Unknown   Iodinated Contrast Media Anaphylaxis and [name]    11/29/17 Cardiac arrest 1 min after IV contrast, possible allergy vs vasovagal episode  Iopamidol   Anaphylaxis   High 11/28/2017   Patient had seizure like activity and then code post 100 cc of isovue 300   Iopamidol Anaphylaxis    11/28/17 Patient had seizure like activity and then 1 min code after 100 cc of isovue 300. Possible contrast allergy vs vasovagal episode  Other Reaction(s): Cardiac Arrest   Lisinopril Anaphylaxis    Tongue and mouth swelling  Other Reaction(s): Laryngeal Edema   Metoclopramide Other (See Comments)    Tardive dyskinesia  Also known as Reglan  Other Reaction(s): Unknown   Nsaids Anaphylaxis and Other (See Comments)    ULCER  Other Reaction(s): Unknown   Penicillins Palpitations and Itching    Has patient had a PCN reaction causing immediate rash, facial/tongue/throat swelling, SOB or lightheadedness with hypotension: Yes, heart races  Has patient had a PCN reaction causing severe rash involving mucus membranes or skin necrosis: No  Has patient had a PCN reaction that required hospitalization: Yes   Has patient had a PCN reaction occurring within the last 10 years: No  Other Reaction(s): Flushing (Red Skin), Laryngeal Edema   Chlorhexidine Dermatitis   Tolmetin Nausea Only, Other (See Comments) and Nausea And Vomiting    ULCER  Other Reaction(s): Unknown   Aspartame And Phenylalanine Hives   Aspirin Other (See Comments)    Irritates stomach ulcer    Dicyclomine Other (See Comments)    Chest pain  Other Reaction(s): Unknown   Rifamycin     Other Reaction(s): palpitations   Acetaminophen Nausea Only,  Other (See Comments) and Nausea And Vomiting    Irritates stomach ulcer; Abdominal pain  Other Reaction(s): Abdominal Pain, ulcer   Cyclobenzaprine Palpitations    Other Reaction(s): palpitations   Oxycodone Palpitations    Other Reaction(s): palpitations   Rifamycins Palpitations   Tramadol Nausea And Vomiting    Other Reaction(s): palpitations    Patient Measurements: Weight: 133 kg (293 lb 3.4 oz) Heparin Dosing Weight: 94 kg  Vital Signs: Temp: 97.8 F (36.6 C) (09/18 0802) Temp Source: Oral (09/18 0802) BP: 103/49 (09/18 0802) Pulse Rate: 78 (09/18 0802)  Labs: Recent Labs    11/23/22 0023 11/23/22 0144 11/23/22 0218 11/24/22 0513  HGB 7.7*  --  7.8* 6.9*  HCT 26.6*  --  23.0* 23.4*  PLT 345  --   --  340  APTT 44*  --   --   --   LABPROT 20.9*  --   --   --   INR 1.8*  --   --   --   CREATININE 10.14*  --   --  5.01*  TROPONINIHS 20* 16  --   --     Estimated Creatinine Clearance: 17.2 mL/min (A) (by C-G formula based on SCr of 5.01 mg/dL (H)).  Assessment: 57 year old female to begin heparin while apixaban is on hold in anticipation of angiogram on Friday.  Last dose of apixaban this AM at 8 am.  Goal of Therapy:  aPTT 66-102 seconds Monitor platelets by anticoagulation protocol: Yes   Plan:  Heparin at 1300 units /  hr to start at 8 pm Heparin level and PTT 8 hours after heparin starts until labs correlate Daily heparin level, PTT, CBC  Thank you Okey Regal, PharmD  11/24/2022,8:11 AM

## 2022-11-24 NOTE — Progress Notes (Signed)
Patient refused wound care and bath on this shift, patient was educated

## 2022-11-24 NOTE — Progress Notes (Signed)
PROGRESS NOTE        PATIENT DETAILS Name: Deborah [name] Age: 57 y.o. Sex: female Date of Birth: 04-11-1965 Admit Date: 11/22/2022 Admitting Physician Dolly Rias, MD PCP:Pcp, No  Brief Summary: Patient is a 57 y.o.  female with history of ESRD, PAF on Eliquis, HFpEF, HTN, DM-2, malignant carcinoid, NAFLD-who presented with weakness/fatigue several episodes of nausea/vomiting-found to have sepsis-likely due to soft tissue infection involving right ankle.  See below for further details.  Significant events: 9/16>> admit to [facility]  Significant studies: 9/17>> CXR: No PNA 9/17>> RUQ ultrasound: No acute abnormality  Significant microbiology data: 9/17>> COVID/influenza/RSV PCR: Negative 9/17>> blood culture: No growth  Procedures: None  Consults: Nephrology Orthopedics  Subjective: Lying comfortably in bed-denies any chest pain or shortness of breath.  Complains of worsening pain in the right foot for the past 1 week.  Objective: Vitals: Blood pressure (!) 103/49, pulse 78, temperature 97.8 F (36.6 C), temperature source Oral, resp. rate 18, weight 133 kg, last menstrual period 10/10/2012, SpO2 90%.   Exam: Gen Exam:Alert awake-not in any distress HEENT:atraumatic, normocephalic Chest: B/L clear to auscultation anteriorly CVS:S1S2 regular Abdomen:soft non tender, non distended Extremities:+ Edema-chronic dry appearing ulcer on the right heel-see picture below. Neurology: Non focal Skin: no rash        Pertinent Labs/Radiology:    Latest Ref Rng & Units 11/24/2022    5:13 AM 11/23/2022    2:18 AM 11/23/2022   12:23 AM  CBC  WBC 4.0 - 10.5 K/uL 19.8   30.3   Hemoglobin 12.0 - 15.0 g/dL 6.9  7.8  7.7   Hematocrit 36.0 - 46.0 % 23.4  23.0  26.6   Platelets 150 - 400 K/uL 340   345     Lab Results  Component Value Date   NA 131 (L) 11/24/2022   K 4.1 11/24/2022   CL 95 (L) 11/24/2022   CO2 25 11/24/2022       Assessment/Plan: Sepsis presumably secondary to soft tissue infection/possible osteomyelitis of right ankle No other sources of infection apparent-CXR-no PNA.  Cultures negative so far.  Dialysis catheter site appears benign. Awaiting MRI-right ankle area to see if there is a deeper source of infection/osteomyelitis. Continue empiric broad-spectrum antibiotics-follow cultures/imaging studies. Orthopedic/vascular surgery following.  ESRD on HD Nephrology following and directing care BP soft-adding midodrine  Normocytic anemia Multifactorial-chronic anemia due to ESRD/CKD-worsened by anemia due to acute illness No evidence of GI bleeding/blood loss 1 unit of PRBC with hemodialysis today.  Minimally elevated troponin Demand ischemia No chest pain/anginal symptoms Supportive care  Transaminitis Mild Unclear etiology but probably due to sepsis physiology/underlying carcinoid syndrome Supportive care at this point. Trend LFTs  Hyponatremia Mild-asymptomatic Should improve with volume removal with HD  Chronic HFpEF Some leg edema but otherwise stable Volume removal with HD  PAF Telemetry monitoring Eliquis held in anticipation of angiogram-on IV heparin  HTN All antihypertensives held-as blood pressure remains soft due to sepsis physiology  History of duodenal carcinoma/carcinoid-s/p resection 2021 Chronic pain syndrome Narcotic dependence Supportive care Continue as needed narcotics-will need to be cautious due to hypotension  DM-2 (A1c 8.19/13) CBG is relatively stable Semglee 40 units daily+ 10 units NovoLog with meals+ SSI Follow  Recent Labs    11/23/22 1348 11/23/22 2308 11/24/22 0800  GLUCAP 117* 96 93     Mood disorder Relatively  stable Cymbalta/Remeron  Morbid Obesity: Estimated body mass index is 47.33 kg/m as calculated from the following:   Height as of 11/18/22: 5\' 6"  (1.676 m).   Weight as of this encounter: 133 kg.   Code status:    Code Status: Full Code   DVT Prophylaxis: IV heparin    Family Communication: None at bedside   Disposition Plan: Status is: Inpatient Remains inpatient appropriate because: Severity of illness   Planned Discharge Destination:Home   Diet: Diet Order             Diet renal/carb modified with fluid restriction Diet-HS Snack? Nothing; Fluid restriction: 1200 mL Fluid; Room service appropriate? Yes; Fluid consistency: Thin  Diet effective now                     Antimicrobial agents: Anti-infectives (From admission, onward)    Start     Dose/Rate Route Frequency Ordered Stop   11/24/22 1200  vancomycin (VANCOCIN) IVPB 1000 mg/200 mL premix        1,000 mg 200 mL/hr over 60 Minutes Intravenous Every M-W-F (Hemodialysis) 11/24/22 0826     11/24/22 0100  ceFEPIme (MAXIPIME) 1 g in sodium chloride 0.9 % 100 mL IVPB        1 g 200 mL/hr over 30 Minutes Intravenous Every 24 hours 11/23/22 0332     11/23/22 0900  metroNIDAZOLE (FLAGYL) tablet 500 mg        500 mg Oral Every 8 hours 11/23/22 0332     11/23/22 0343  vancomycin variable dose per unstable renal function (pharmacist dosing)         Does not apply See admin instructions 11/23/22 0343     11/23/22 0100  aztreonam (AZACTAM) 2 g in sodium chloride 0.9 % 100 mL IVPB  Status:  Discontinued        2 g 200 mL/hr over 30 Minutes Intravenous  Once 11/23/22 0045 11/23/22 0051   11/23/22 0100  metroNIDAZOLE (FLAGYL) IVPB 500 mg        500 mg 100 mL/hr over 60 Minutes Intravenous  Once 11/23/22 0045 11/23/22 0231   11/23/22 0100  vancomycin (VANCOCIN) IVPB 1000 mg/200 mL premix  Status:  Discontinued        1,000 mg 200 mL/hr over 60 Minutes Intravenous  Once 11/23/22 0045 11/23/22 0051   11/23/22 0100  vancomycin (VANCOCIN) 2,500 mg in sodium chloride 0.9 % 500 mL IVPB        2,500 mg 262.5 mL/hr over 120 Minutes Intravenous  Once 11/23/22 0051 11/23/22 0506   11/23/22 0100  ceFEPIme (MAXIPIME) 2 g in sodium chloride 0.9  % 100 mL IVPB        2 g 200 mL/hr over 30 Minutes Intravenous STAT 11/23/22 0051 11/23/22 0204        MEDICATIONS: Scheduled Meds:  sodium chloride   Intravenous Once   doxercalciferol  4 mcg Intravenous Q M,W,F-HD   DULoxetine  40 mg Oral Daily   influenza vac split trivalent PF  0.5 mL Intramuscular Tomorrow-1000   insulin aspart  0-15 Units Subcutaneous TID WC   insulin aspart  10 Units Subcutaneous TID WC   insulin glargine-yfgn  40 Units Subcutaneous Daily   leptospermum manuka honey  1 Application Topical Daily   metroNIDAZOLE  500 mg Oral Q8H   mirtazapine  15 mg Oral QHS   pantoprazole  40 mg Oral Daily   sodium chloride flush  3 mL Intravenous Q12H  vancomycin variable dose per unstable renal function (pharmacist dosing)   Does not apply See admin instructions   Continuous Infusions:  ceFEPime (MAXIPIME) IV 1 g (11/24/22 0110)   heparin     vancomycin     PRN Meds:.albuterol, HYDROmorphone **OR** HYDROmorphone, ondansetron (ZOFRAN) IV   I have personally reviewed following labs and imaging studies  LABORATORY DATA: CBC: Recent Labs  Lab 11/23/22 0023 11/23/22 0218 11/24/22 0513  WBC 30.3*  --  19.8*  NEUTROABS 27.1*  --  17.0*  HGB 7.7* 7.8* 6.9*  HCT 26.6* 23.0* 23.4*  MCV 91.7  --  90.7  PLT 345  --  340    Basic Metabolic Panel: Recent Labs  Lab 11/23/22 0023 11/23/22 0218 11/23/22 1545 11/24/22 0513  NA 127* 127*  --  131*  K 4.0 3.7  --  4.1  CL 86*  --   --  95*  CO2 16*  --   --  25  GLUCOSE 270*  --   --  87  BUN 68*  --   --  29*  CREATININE 10.14*  --   --  5.01*  CALCIUM 9.3  --   --  7.9*  PHOS  --   --  8.4*  --     GFR: Estimated Creatinine Clearance: 17.2 mL/min (A) (by C-G formula based on SCr of 5.01 mg/dL (H)).  Liver Function Tests: Recent Labs  Lab 11/23/22 0023 11/24/22 0513  AST 47* 46*  ALT 15 15  ALKPHOS 86 54  BILITOT 1.3* 1.3*  PROT 7.8 6.1*  ALBUMIN 2.2* 1.8*   No results for input(s): "LIPASE",  "AMYLASE" in the last 168 hours. No results for input(s): "AMMONIA" in the last 168 hours.  Coagulation Profile: Recent Labs  Lab 11/23/22 0023  INR 1.8*    Cardiac Enzymes: No results for input(s): "CKTOTAL", "CKMB", "CKMBINDEX", "TROPONINI" in the last 168 hours.  BNP (last 3 results) No results for input(s): "PROBNP" in the last 8760 hours.  Lipid Profile: No results for input(s): "CHOL", "HDL", "LDLCALC", "TRIG", "CHOLHDL", "LDLDIRECT" in the last 72 hours.  Thyroid Function Tests: No results for input(s): "TSH", "T4TOTAL", "FREET4", "T3FREE", "THYROIDAB" in the last 72 hours.  Anemia Panel: No results for input(s): "VITAMINB12", "FOLATE", "FERRITIN", "TIBC", "IRON", "RETICCTPCT" in the last 72 hours.  Urine analysis:    Component Value Date/Time   COLORURINE STRAW (A) 06/19/2022 0638   APPEARANCEUR HAZY (A) 06/19/2022 0638   LABSPEC 1.010 06/19/2022 0638   PHURINE 5.0 06/19/2022 0638   GLUCOSEU NEGATIVE 06/19/2022 0638   HGBUR SMALL (A) 06/19/2022 0638   BILIRUBINUR NEGATIVE 06/19/2022 0638   KETONESUR NEGATIVE 06/19/2022 0638   PROTEINUR 30 (A) 06/19/2022 0638   UROBILINOGEN 0.2 10/02/2013 2108   NITRITE NEGATIVE 06/19/2022 0638   LEUKOCYTESUR LARGE (A) 06/19/2022 0638    Sepsis Labs: Lactic Acid, Venous    Component Value Date/Time   LATICACIDVEN 1.0 11/23/2022 0914    MICROBIOLOGY: Recent Results (from the past 240 hour(s))  Resp panel by RT-PCR (RSV, Flu A&B, Covid) Anterior Nasal Swab     Status: None   Collection Time: 11/23/22 12:23 AM   Specimen: Anterior Nasal Swab  Result Value Ref Range Status   SARS Coronavirus 2 by RT PCR NEGATIVE NEGATIVE Final   Influenza A by PCR NEGATIVE NEGATIVE Final   Influenza B by PCR NEGATIVE NEGATIVE Final    Comment: (NOTE) The Xpert Xpress SARS-CoV-2/FLU/RSV plus assay is intended as an aid in the diagnosis of  influenza from Nasopharyngeal swab specimens and should not be used as a sole basis for treatment.  Nasal washings and aspirates are unacceptable for Xpert Xpress SARS-CoV-2/FLU/RSV testing.  Fact Sheet for Patients: BloggerCourse.com  Fact Sheet for Healthcare Providers: SeriousBroker.it  This test is not yet approved or cleared by the [place] FDA and has been authorized for detection and/or diagnosis of SARS-CoV-2 by FDA under an Emergency Use Authorization (EUA). This EUA will remain in effect (meaning this test can be used) for the duration of the COVID-19 declaration under Section 564(b)(1) of the Act, 21 U.S.C. section 360bbb-3(b)(1), unless the authorization is terminated or revoked.     Resp Syncytial Virus by PCR NEGATIVE NEGATIVE Final    Comment: (NOTE) Fact Sheet for Patients: BloggerCourse.com  Fact Sheet for Healthcare Providers: SeriousBroker.it  This test is not yet approved or cleared by the [place] FDA and has been authorized for detection and/or diagnosis of SARS-CoV-2 by FDA under an Emergency Use Authorization (EUA). This EUA will remain in effect (meaning this test can be used) for the duration of the COVID-19 declaration under Section 564(b)(1) of the Act, 21 U.S.C. section 360bbb-3(b)(1), unless the authorization is terminated or revoked.  Performed at [facility], [address], [place], [place] [address]   Blood Culture (routine x 2)     Status: None (Preliminary result)   Collection Time: 11/23/22 12:23 AM   Specimen: BLOOD  Result Value Ref Range Status   Specimen Description BLOOD SITE NOT SPECIFIED  Final   Special Requests   Final    BOTTLES DRAWN AEROBIC AND ANAEROBIC Blood Culture adequate volume   Culture   Final    NO GROWTH 1 DAY Performed at [facility], [address], [place], [place] [address]    Report Status PENDING  Incomplete    RADIOLOGY STUDIES/RESULTS: US Abdomen Limited RUQ  (LIVER/GB)  Result Date: 11/23/2022 CLINICAL DATA:  History of prior cholecystectomy with elevated BUN EXAM: ULTRASOUND ABDOMEN LIMITED RIGHT UPPER QUADRANT COMPARISON:  None Available. FINDINGS: Gallbladder: Surgically removed Common bile duct: Diameter: 3.6 mm Liver: No focal lesion identified. Within normal limits in parenchymal echogenicity. Portal vein is patent on color Doppler imaging with normal direction of blood flow towards the liver. Other: None. IMPRESSION: No acute abnormality noted. Electronically Signed   By: Alcide Clever M.D.   On: 11/23/2022 03:02   DG Chest Port 1 View  Result Date: 11/23/2022 CLINICAL DATA:  Sepsis, weakness EXAM: PORTABLE CHEST 1 VIEW COMPARISON:  10/19/2022 FINDINGS: Lungs are clear.  No pleural effusion or pneumothorax. The heart is top-normal in size. Right IJ dual lumen dialysis catheter terminating at the cavoatrial junction. IMPRESSION: No acute cardiopulmonary disease. Electronically Signed   By: Charline Bills M.D.   On: 11/23/2022 00:41     LOS: 1 day   Jeoffrey Massed, MD  [facility]    To contact the attending provider between 7A-7P or the covering provider during after hours 7P-7A, please log into the web site [email or web address] and access using universal [place] password for that web site. If you do not have the password, please call the hospital operator.  11/24/2022, 11:14 AM

## 2022-11-24 NOTE — Consult Note (Signed)
ORTHOPAEDIC CONSULTATION  REQUESTING PHYSICIAN: Maretta Bees, MD  Chief Complaint: Chronic right heel ulcer.  Patient states she is unsure why she presented to the hospital at this time.  HPI: Deborah [name] is a 57 y.o. female who presents with chronic right heel decubitus ulcer.  Patient's was recently seen in the office about a month ago and started with debridement and wound care and pressure offloading.  Past Medical History:  Diagnosis Date   Acute back pain with sciatica, left    Acute back pain with sciatica, right    AKI (acute kidney injury) (HCC)    Anemia, unspecified    Cancer (HCC)    Carcinoid tumor of duodenum    Chest pain with normal coronary angiography 2019   Chronic kidney disease, stage 3b (HCC)    Chronic pain    Chronic systolic CHF (congestive heart failure) (HCC)    Diabetes mellitus    DKA (diabetic ketoacidosis) (HCC)    Drug-seeking behavior    21 hospitalizations and 14 CT a/p in 2 years for N/V and abdominal pain, demanding only IV dilaudid   Elevated troponin    chronic   Esophageal reflux    Fibromyalgia    Gastric ulcer    Gastroparesis    Gout    Hyperlipidemia    Hypertension    Hypokalemia    Hypomagnesemia    Lumbosacral stenosis    LVH (left ventricular hypertrophy)    Morbid obesity (HCC)    NICM (nonischemic cardiomyopathy) (HCC)    PAF (paroxysmal atrial fibrillation) (HCC)    Stroke (HCC) 02/2011   Thrombocytosis    Vitamin B12 deficiency anemia    Past Surgical History:  Procedure Laterality Date   AV FISTULA PLACEMENT Left 06/30/2022   Procedure: LEFT BRACHIOCEPHALIC ARTERIOVENOUS (AV) FISTULA CREATION;  Surgeon: Cephus Shelling, MD;  Location: [facility] OR;  Service: Vascular;  Laterality: Left;   BIOPSY  07/27/2019   Procedure: BIOPSY;  Surgeon: Vida Rigger, MD;  Location: [facility]COPY;  Service: Endoscopy;;   BIOPSY  07/30/2019   Procedure: BIOPSY;  Surgeon: Kathi Der, MD;  Location: [facility];   Service: Gastroenterology;;   CATARACT EXTRACTION  01/2014   CHOLECYSTECTOMY     COLONOSCOPY WITH PROPOFOL N/A 07/30/2019   Procedure: COLONOSCOPY WITH PROPOFOL;  Surgeon: Kathi Der, MD;  Location: [facility];  Service: Gastroenterology;  Laterality: N/A;   ESOPHAGOGASTRODUODENOSCOPY N/A 07/27/2019   Procedure: ESOPHAGOGASTRODUODENOSCOPY (EGD);  Surgeon: Vida Rigger, MD;  Location: [facility];  Service: Endoscopy;  Laterality: N/A;   ESOPHAGOGASTRODUODENOSCOPY N/A 07/26/2020   Procedure: ESOPHAGOGASTRODUODENOSCOPY (EGD);  Surgeon: Willis Modena, MD;  Location: [facility];  Service: Endoscopy;  Laterality: N/A;   ESOPHAGOGASTRODUODENOSCOPY (EGD) WITH PROPOFOL N/A 08/02/2019   Procedure: ESOPHAGOGASTRODUODENOSCOPY (EGD) WITH PROPOFOL;  Surgeon: Kathi Der, MD;  Location: [facility];  Service: Gastroenterology;  Laterality: N/A;   HEMOSTASIS CLIP PLACEMENT  08/02/2019   Procedure: HEMOSTASIS CLIP PLACEMENT;  Surgeon: Kathi Der, MD;  Location: [facility];  Service: Gastroenterology;;   IR FLUORO GUIDE CV LINE RIGHT  06/24/2022   IR US GUIDE VASC ACCESS RIGHT  06/24/2022   POLYPECTOMY  07/30/2019   Procedure: POLYPECTOMY;  Surgeon: Kathi Der, MD;  Location: [facility];  Service: Gastroenterology;;   POLYPECTOMY  08/02/2019   Procedure: POLYPECTOMY;  Surgeon: Kathi Der, MD;  Location: [facility];  Service: Gastroenterology;;   Social History   Socioeconomic History   Marital status: Married    Spouse name: Not on file  Number of children: Not on file   Years of education: Not on file   Highest education level: Not on file  Occupational History   Not on file  Tobacco Use   Smoking status: Never   Smokeless tobacco: Never  Vaping Use   Vaping status: Never Used  Substance and Sexual Activity   Alcohol use: No   Drug use: No   Sexual activity: Not Currently    Birth control/protection: None  Other Topics Concern   Not on file  Social  History Narrative   ** Merged History Encounter **       Social Determinants of Health   Financial Resource Strain: Low Risk (03/23/2021)   Received from [facility])   Financial Resource Strain  Food Insecurity: No Food Insecurity (11/23/2022)   Hunger Vital Sign    Worried About Running Out of Food in the Last Year: Never true    Ran Out of Food in the Last Year: Never true  Transportation Needs: No Transportation Needs (11/23/2022)   PRAPARE - Administrator, Civil Service (Medical): No    Lack of Transportation (Non-Medical): No  Physical Activity: Not on File (10/31/2017)   Received from [place], [place]   Physical Activity    Physical Activity: 0  Stress: Low Risk (03/23/2021)   Received from [facility])   Stress    Over the last 2 weeks, how often have you been bothered by the following problems: feeling nervous, anxious, on edge?: Not at all    Over the last 2 weeks, how often have you been bothered by the following problems: Not being able to stop or control worrying?: Not at all  Social Connections: Unknown (07/19/2021)   Received from [facility]   Social Network    Social Network: Not on file   Family History  Problem Relation Age of Onset   Diabetes Mother    Diabetes Father    Heart disease Father    Diabetes Sister    Congestive Heart Failure Sister 48   Diabetes Brother    - negative except otherwise stated in the family history section Allergies  Allergen Reactions   Diazepam [name]   Gabapentin [name] and Swelling    Other reaction(s): Unknown   Iodinated Contrast Media Anaphylaxis and [name]    11/29/17 Cardiac arrest 1 min after IV contrast, possible allergy vs vasovagal episode  Iopamidol   Anaphylaxis   High 11/28/2017   Patient had seizure like activity and then code post 100 cc of isovue 300   Iopamidol Anaphylaxis     11/28/17 Patient had seizure like activity and then 1 min code after 100 cc of isovue 300. Possible contrast allergy vs vasovagal episode  Other Reaction(s): Cardiac Arrest   Lisinopril Anaphylaxis    Tongue and mouth swelling  Other Reaction(s): Laryngeal Edema   Metoclopramide Other (See Comments)    Tardive dyskinesia  Also known as Reglan  Other Reaction(s): Unknown   Nsaids Anaphylaxis and Other (See Comments)    ULCER  Other Reaction(s): Unknown   Penicillins Palpitations and Itching    Has patient had a PCN reaction causing immediate rash, facial/tongue/throat swelling, SOB or lightheadedness with hypotension: Yes, heart races  Has patient had a PCN reaction causing severe rash involving mucus membranes or skin necrosis: No  Has patient had a PCN reaction that required hospitalization: Yes   Has patient  had a PCN reaction occurring within the last 10 years: No  Other Reaction(s): Flushing (Red Skin), Laryngeal Edema   Chlorhexidine Dermatitis   Tolmetin Nausea Only, Other (See Comments) and Nausea And Vomiting    ULCER  Other Reaction(s): Unknown   Aspartame And Phenylalanine Hives   Aspirin Other (See Comments)    Irritates stomach ulcer    Dicyclomine Other (See Comments)    Chest pain  Other Reaction(s): Unknown   Rifamycin     Other Reaction(s): palpitations   Acetaminophen Nausea Only, Other (See Comments) and Nausea And Vomiting    Irritates stomach ulcer; Abdominal pain  Other Reaction(s): Abdominal Pain, ulcer   Cyclobenzaprine Palpitations    Other Reaction(s): palpitations   Oxycodone Palpitations    Other Reaction(s): palpitations   Rifamycins Palpitations   Tramadol Nausea And Vomiting    Other Reaction(s): palpitations   Prior to Admission medications   Medication Sig Start Date End Date Taking? Authorizing Provider  albuterol (PROVENTIL) (2.5 MG/3ML) 0.083% nebulizer solution Take 3 mLs (2.5 mg total) by nebulization every 6 (six) hours  as needed for wheezing or shortness of breath. 04/06/19  Yes Bing Neighbors, NP  allopurinol (ZYLOPRIM) 100 MG tablet Take 100 mg by mouth 2 (two) times daily. 08/05/22  Yes [provider]  apixaban (ELIQUIS) 5 MG TABS tablet Take 5 mg by mouth 2 (two) times daily. 12/03/21  Yes [provider]  HYDROmorphone (DILAUDID) 4 MG tablet Take 1 tablet (4 mg total) by mouth 3 (three) times daily as needed for moderate pain. Three times a day as needed 20 days out of the month. Patient taking differently: Take 4 mg by mouth 2 (two) times daily. 11/18/22  Yes Jones Bales, NP  amLODipine (NORVASC) 10 MG tablet Take 10 mg by mouth daily. 08/16/22   [provider]  atorvastatin (LIPITOR) 10 MG tablet Take 10 mg by mouth at bedtime. 12/03/21   [provider]  AURYXIA 1 GM 210 MG(Fe) tablet Take by mouth. 08/09/22   [provider]  busPIRone (BUSPAR) 5 MG tablet Take 5 mg by mouth 2 (two) times daily.    [provider]  carvedilol (COREG) 12.5 MG tablet Take 1 tablet (12.5 mg total) by mouth 2 (two) times daily with a meal. 06/04/22   Leroy Sea, MD  cetirizine (ZYRTEC) 10 MG tablet Take by mouth. 08/05/22   [provider]  colchicine 0.6 MG tablet Take 0.5 tablets (0.3 mg total) by mouth 2 (two) times a week. 07/15/22   Zannie Cove, MD  donepezil (ARICEPT) 5 MG tablet Take by mouth. 08/05/22   [provider]  Doxercalciferol (HECTOROL IV) Doxercalciferol (Hectorol) 08/20/22 08/19/23  [provider]  doxycycline (VIBRAMYCIN) 100 MG capsule Take 1 capsule (100 mg total) by mouth 2 (two) times daily. 10/19/22   Terrilee Files, MD  DULoxetine HCl 40 MG CPEP Take by mouth. 08/05/22   [provider]  EASY COMFORT PEN NEEDLES 31G X 5 MM MISC USE 3 TIMES A DAY FOR INSULIN ADMINISTRATION 11/14/19   Meccariello, Solmon Ice, MD  famotidine (PEPCID) 20 MG tablet Take 1 tablet (20 mg total) by mouth daily. 03/22/22 07/20/22   Cora Collum, DO  ferrous sulfate 325 (65 FE) MG tablet Take 1 tablet (325 mg total) by mouth 2 (two) times daily with a meal. 05/27/22   Kathlen Mody, MD  fluticasone (FLONASE) 50 MCG/ACT nasal spray Place 2 sprays into both nostrils daily as needed  for allergies or rhinitis. Patient taking differently: Place 1 spray into both nostrils daily as needed for allergies. 12/19/18   Rai, Delene Ruffini, MD  folic acid (FOLVITE) 1 MG tablet Take 1 mg by mouth daily. 04/07/21   [provider]  furosemide (LASIX) 80 MG tablet Take 1 tablet (80 mg total) by mouth daily. 07/06/22   Rhetta Mura, MD  hydrALAZINE (APRESOLINE) 50 MG tablet Take 50 mg by mouth 3 (three) times daily. 08/05/22   [provider]  insulin aspart (NOVOLOG) 100 UNIT/ML injection Inject 5 Units into the skin 3 (three) times daily with meals. 06/04/22   Leroy Sea, MD  insulin glargine-yfgn (SEMGLEE) 100 UNIT/ML injection Inject 0.15 mLs (15 Units total) into the skin 2 (two) times daily. 07/05/22   Rhetta Mura, MD  insulin lispro (HUMALOG) 100 UNIT/ML KwikPen Before each meal 3 times a day, 140-199 - 2 units, 200-250 - 6 units, 251-299 - 8 units,  300-349 - 12 units,  350 or above 14 units. Patient taking differently: Inject 2-14 Units into the skin See admin instructions. Before each meal and at bedtime. 140-199 - 2 units, 200-250 - 6 units, 251-299 - 8 units,  300-349 - 12 units,  350 or above 14 units. 06/04/22   Leroy Sea, MD  insulin lispro (HUMALOG) 100 UNIT/ML KwikPen Inject into the skin. 08/30/22   [provider]  isosorbide mononitrate (IMDUR) 30 MG 24 hr tablet Take by mouth. 08/05/22   [provider]  isosorbide mononitrate (IMDUR) 60 MG 24 hr tablet Take 1 tablet by mouth daily. 08/31/22 08/31/23  [provider]  LANTUS SOLOSTAR 100 UNIT/ML Solostar Pen Inject into the skin. 09/10/20   [provider]  loperamide (IMODIUM A-D) 2 MG tablet Take 2 mg  by mouth every 6 (six) hours as needed for diarrhea or loose stools.    [provider]  melatonin 3 MG TABS tablet Take by mouth. 10/21/20   [provider]  Methoxy PEG-Epoetin Beta (MIRCERA IJ) Mircera 08/06/22 08/05/23  [provider]  metoprolol succinate (TOPROL-XL) 50 MG 24 hr tablet Take by mouth. 10/21/20   [provider]  mirtazapine (REMERON) 15 MG tablet Take 15 mg by mouth at bedtime. 10/18/22   [provider]  Multiple Vitamin (MULTIVITAMIN WITH MINERALS) TABS tablet Take 1 tablet by mouth daily. 05/28/22   Kathlen Mody, MD  omeprazole (PRILOSEC) 20 MG capsule Take by mouth. 08/05/22   [provider]  ondansetron (ZOFRAN ODT) 4 MG disintegrating tablet Take 1 tablet (4 mg total) by mouth every 8 (eight) hours as needed for nausea or vomiting. 05/30/21   [name], MD  pantoprazole (PROTONIX) 40 MG tablet TAKE 1 TABLET BY MOUTH 2 (TWO) TIMES DAILY. (AM+BEDTIME) 08/30/22   Raulkar, Drema Pry, MD  spironolactone (ALDACTONE) 25 MG tablet Take 12.5 mg by mouth daily. 08/30/22   [provider]  torsemide (DEMADEX) 100 MG tablet  08/31/22   [provider]  torsemide (DEMADEX) 20 MG tablet TAKE 1 TABLET DAILY AS NEEDED FOR LEG EDEMA/SWELLING (AM) 08/31/22   [provider]  Vitamin D, Ergocalciferol, (DRISDOL) 1.25 MG (50000 UNIT) CAPS capsule Take 50,000 Units by mouth once a week. 04/07/21   [provider]   US Abdomen Limited RUQ (LIVER/GB)  Result Date: 11/23/2022 CLINICAL DATA:  History of prior cholecystectomy with elevated BUN EXAM: ULTRASOUND ABDOMEN LIMITED RIGHT UPPER QUADRANT COMPARISON:  None Available. FINDINGS: Gallbladder: Surgically removed Common bile duct: Diameter: 3.6  mm Liver: No focal lesion identified. Within normal limits in parenchymal echogenicity. Portal vein is patent on color Doppler imaging with normal direction of blood flow towards the liver. Other: None. IMPRESSION: No  acute abnormality noted. Electronically Signed   By: Alcide Clever M.D.   On: 11/23/2022 03:02   DG Chest Port 1 View  Result Date: 11/23/2022 CLINICAL DATA:  Sepsis, weakness EXAM: PORTABLE CHEST 1 VIEW COMPARISON:  10/19/2022 FINDINGS: Lungs are clear.  No pleural effusion or pneumothorax. The heart is top-normal in size. Right IJ dual lumen dialysis catheter terminating at the cavoatrial junction. IMPRESSION: No acute cardiopulmonary disease. Electronically Signed   By: Charline Bills M.D.   On: 11/23/2022 00:41   - pertinent xrays, CT, MRI studies were reviewed and independently interpreted  Positive ROS: All other systems have been reviewed and were otherwise negative with the exception of those mentioned in the HPI and as above.  Physical Exam: General: Alert, no acute distress Psychiatric: Patient is competent for consent with normal mood and affect Lymphatic: No axillary or cervical lymphadenopathy Cardiovascular: No pedal edema Respiratory: No cyanosis, no use of accessory musculature GI: No organomegaly, abdomen is soft and non-tender    Images:  @ENCIMAGES @  Labs:  Lab Results  Component Value Date   HGBA1C 8.1 (H) 06/19/2022   HGBA1C 10.1 (H) 03/21/2022   HGBA1C 7.5 (H) 08/16/2021   ESRSEDRATE 122 (H) 11/24/2022   CRP 21.1 (H) 11/24/2022   CRP 0.7 06/04/2022   CRP 1.3 (H) 06/03/2022   LABURIC 6.2 07/04/2022   LABURIC 8.0 (H) 06/20/2022   LABURIC 10.4 (H) 06/03/2022   REPTSTATUS PENDING 11/23/2022   CULT  11/23/2022    NO GROWTH < 12 HOURS Performed at [facility], [address], [place], [place] [address]    LABORGA ESCHERICHIA COLI (A) 08/15/2021    Lab Results  Component Value Date   ALBUMIN 1.8 (L) 11/24/2022   ALBUMIN 2.2 (L) 11/23/2022   ALBUMIN 2.5 (L) 10/19/2022   LABURIC 6.2 07/04/2022   LABURIC 8.0 (H) 06/20/2022   LABURIC 10.4 (H) 06/03/2022        Latest Ref Rng & Units 11/24/2022    5:13 AM 11/23/2022    2:18 AM 11/23/2022    12:23 AM  CBC EXTENDED  WBC 4.0 - 10.5 K/uL 19.8   30.3   RBC 3.87 - 5.11 MIL/uL 2.58   2.90   Hemoglobin 12.0 - 15.0 g/dL 6.9  7.8  7.7   HCT 60.4 - 46.0 % 23.4  23.0  26.6   Platelets 150 - 400 K/uL 340   345   NEUT# 1.7 - 7.7 K/uL 17.0   27.1   Lymph# 0.7 - 4.0 K/uL 1.3   1.8     Neurologic: Patient does not have protective sensation bilateral lower extremities.   MUSCULOSKELETAL:   Skin: Examination patient has a flat ulcer approximately 6 cm in diameter with several areas of black eschar.  The remainder of the wound has healthy granulation tissue without tunneling.  Patient has a palpable dorsalis pedis pulse bilaterally.  Previous radiographs shows some cystic changes in the calcaneus.  Hemoglobin 6.9 with a white cell count of 19.8.  Albumin 1.8 with a hemoglobin A1c of 8.1 in April.  Assessment: Assessment: Chronic decubitus right heel ulcer.  Plan: I will place orders for wound care.  Patient has also been seen by vascular surgery with possible endovascular evaluation.  MRI scan pending to evaluate for osteo of the  calcaneus.  ABI pending.  Thank you for the consult and the opportunity to see Ms. Charmaine Downs, MD [facility] [phone] 7:56 AM

## 2022-11-25 ENCOUNTER — Inpatient Hospital Stay (HOSPITAL_COMMUNITY): Payer: 59

## 2022-11-25 DIAGNOSIS — E1151 Type 2 diabetes mellitus with diabetic peripheral angiopathy without gangrene: Secondary | ICD-10-CM | POA: Diagnosis not present

## 2022-11-25 DIAGNOSIS — A419 Sepsis, unspecified organism: Secondary | ICD-10-CM | POA: Diagnosis not present

## 2022-11-25 DIAGNOSIS — I10 Essential (primary) hypertension: Secondary | ICD-10-CM | POA: Diagnosis not present

## 2022-11-25 DIAGNOSIS — N186 End stage renal disease: Secondary | ICD-10-CM | POA: Diagnosis not present

## 2022-11-25 LAB — CBC WITH DIFFERENTIAL/PLATELET
Abs Immature Granulocytes: 0.26 10*3/uL — ABNORMAL HIGH (ref 0.00–0.07)
Basophils Absolute: 0.1 10*3/uL (ref 0.0–0.1)
Basophils Relative: 0 %
Eosinophils Absolute: 0.3 10*3/uL (ref 0.0–0.5)
Eosinophils Relative: 1 %
HCT: 26.8 % — ABNORMAL LOW (ref 36.0–46.0)
Hemoglobin: 7.6 g/dL — ABNORMAL LOW (ref 12.0–15.0)
Immature Granulocytes: 1 %
Lymphocytes Relative: 7 %
Lymphs Abs: 1.3 10*3/uL (ref 0.7–4.0)
MCH: 25.8 pg — ABNORMAL LOW (ref 26.0–34.0)
MCHC: 28.4 g/dL — ABNORMAL LOW (ref 30.0–36.0)
MCV: 90.8 fL (ref 80.0–100.0)
Monocytes Absolute: 0.8 10*3/uL (ref 0.1–1.0)
Monocytes Relative: 4 %
Neutro Abs: 16.4 10*3/uL — ABNORMAL HIGH (ref 1.7–7.7)
Neutrophils Relative %: 87 %
Platelets: 429 10*3/uL — ABNORMAL HIGH (ref 150–400)
RBC: 2.95 MIL/uL — ABNORMAL LOW (ref 3.87–5.11)
RDW: 16.4 % — ABNORMAL HIGH (ref 11.5–15.5)
WBC: 19.1 10*3/uL — ABNORMAL HIGH (ref 4.0–10.5)
nRBC: 0 % (ref 0.0–0.2)

## 2022-11-25 LAB — GLUCOSE, CAPILLARY
Glucose-Capillary: 65 mg/dL — ABNORMAL LOW (ref 70–99)
Glucose-Capillary: 69 mg/dL — ABNORMAL LOW (ref 70–99)
Glucose-Capillary: 74 mg/dL (ref 70–99)
Glucose-Capillary: 77 mg/dL (ref 70–99)
Glucose-Capillary: 87 mg/dL (ref 70–99)
Glucose-Capillary: 92 mg/dL (ref 70–99)

## 2022-11-25 LAB — RENAL FUNCTION PANEL
Albumin: 1.9 g/dL — ABNORMAL LOW (ref 3.5–5.0)
Anion gap: 11 (ref 5–15)
BUN: 18 mg/dL (ref 6–20)
CO2: 25 mmol/L (ref 22–32)
Calcium: 7.6 mg/dL — ABNORMAL LOW (ref 8.9–10.3)
Chloride: 95 mmol/L — ABNORMAL LOW (ref 98–111)
Creatinine, Ser: 3.54 mg/dL — ABNORMAL HIGH (ref 0.44–1.00)
GFR, Estimated: 14 mL/min — ABNORMAL LOW (ref >60)
Glucose, Bld: 76 mg/dL (ref 70–99)
Phosphorus: 3.4 mg/dL (ref 2.5–4.6)
Potassium: 3.1 mmol/L — ABNORMAL LOW (ref 3.5–5.1)
Sodium: 131 mmol/L — ABNORMAL LOW (ref 135–145)

## 2022-11-25 LAB — HEPATIC FUNCTION PANEL
ALT: 16 U/L (ref 0–44)
AST: 41 U/L (ref 15–41)
Albumin: 1.9 g/dL — ABNORMAL LOW (ref 3.5–5.0)
Alkaline Phosphatase: 59 U/L (ref 38–126)
Bilirubin, Direct: 0.2 mg/dL (ref 0.0–0.2)
Indirect Bilirubin: 0.9 mg/dL (ref 0.3–0.9)
Total Bilirubin: 1.1 mg/dL (ref 0.3–1.2)
Total Protein: 6.6 g/dL (ref 6.5–8.1)

## 2022-11-25 LAB — HEPARIN LEVEL (UNFRACTIONATED): Heparin Unfractionated: >1.1 IU/mL — ABNORMAL HIGH (ref 0.30–0.70)

## 2022-11-25 LAB — APTT: aPTT: 42 seconds — ABNORMAL HIGH (ref 24–36)

## 2022-11-25 MED ORDER — GLUCOSE 40 % PO GEL
1.0000 | ORAL | Status: AC
  Administered 2022-11-25: 31 g via ORAL
  Filled 2022-11-25: qty 1.21

## 2022-11-25 MED ORDER — POTASSIUM CHLORIDE CRYS ER 20 MEQ PO TBCR
40.0000 meq | EXTENDED_RELEASE_TABLET | Freq: Once | ORAL | Status: AC
  Administered 2022-11-25: 40 meq via ORAL
  Filled 2022-11-25: qty 2

## 2022-11-25 MED ORDER — INSULIN GLARGINE-YFGN 100 UNIT/ML ~~LOC~~ SOLN
30.0000 [IU] | Freq: Every day | SUBCUTANEOUS | Status: DC
  Administered 2022-11-27 – 2022-11-30 (×4): 30 [IU] via SUBCUTANEOUS
  Filled 2022-11-25 (×6): qty 0.3

## 2022-11-25 NOTE — Progress Notes (Signed)
ANTICOAGULATION CONSULT NOTE - Follow Up Consult  Pharmacy Consult for Heparin Indication: atrial fibrillation (apixaban on hold)  Allergies  Allergen Reactions   Diazepam [name]   Gabapentin [name] and Swelling    Other reaction(s): Unknown   Iodinated Contrast Media Anaphylaxis and [name]    11/29/17 Cardiac arrest 1 min after IV contrast, possible allergy vs vasovagal episode  Iopamidol   Anaphylaxis   High 11/28/2017   Patient had seizure like activity and then code post 100 cc of isovue 300   Iopamidol Anaphylaxis    11/28/17 Patient had seizure like activity and then 1 min code after 100 cc of isovue 300. Possible contrast allergy vs vasovagal episode  Other Reaction(s): Cardiac Arrest   Lisinopril Anaphylaxis    Tongue and mouth swelling  Other Reaction(s): Laryngeal Edema   Metoclopramide Other (See Comments)    Tardive dyskinesia  Also known as Reglan  Other Reaction(s): Unknown   Nsaids Anaphylaxis and Other (See Comments)    ULCER  Other Reaction(s): Unknown   Penicillins Palpitations and Itching    Has patient had a PCN reaction causing immediate rash, facial/tongue/throat swelling, SOB or lightheadedness with hypotension: Yes, heart races  Has patient had a PCN reaction causing severe rash involving mucus membranes or skin necrosis: No  Has patient had a PCN reaction that required hospitalization: Yes   Has patient had a PCN reaction occurring within the last 10 years: No  Other Reaction(s): Flushing (Red Skin), Laryngeal Edema   Chlorhexidine Dermatitis   Tolmetin Nausea Only, Other (See Comments) and Nausea And Vomiting    ULCER  Other Reaction(s): Unknown   Aspartame And Phenylalanine Hives   Aspirin Other (See Comments)    Irritates stomach ulcer    Dicyclomine Other (See Comments)    Chest pain  Other Reaction(s): Unknown   Rifamycin     Other Reaction(s): palpitations   Acetaminophen Nausea Only,  Other (See Comments) and Nausea And Vomiting    Irritates stomach ulcer; Abdominal pain  Other Reaction(s): Abdominal Pain, ulcer   Cyclobenzaprine Palpitations    Other Reaction(s): palpitations   Oxycodone Palpitations    Other Reaction(s): palpitations   Rifamycins Palpitations   Tramadol Nausea And Vomiting    Other Reaction(s): palpitations    Patient Measurements: Weight: 133 kg (293 lb 3.4 oz) Heparin Dosing Weight: 94 kg  Vital Signs: Temp: 98.3 F (36.8 C) (09/19 0554) Temp Source: Oral (09/19 0554) BP: 122/76 (09/19 0400) Pulse Rate: 93 (09/19 0554)  Labs: Recent Labs    11/23/22 0023 11/23/22 0144 11/23/22 0218 11/24/22 0513 11/24/22 2250 11/25/22 0609  HGB 7.7*  --    < > 6.9* 7.6* 7.6*  HCT 26.6*  --    < > 23.4* 25.4* 26.8*  PLT 345  --   --  340  --  429*  APTT 44*  --   --   --   --  42*  LABPROT 20.9*  --   --   --   --   --   INR 1.8*  --   --   --   --   --   HEPARINUNFRC  --   --   --   --   --  >1.10*  CREATININE 10.14*  --   --  5.01*  --  3.54*  TROPONINIHS 20* 16  --   --   --   --    < > = values in this interval  not displayed.    Estimated Creatinine Clearance: 24.3 mL/min (A) (by C-G formula based on SCr of 3.54 mg/dL (H)).  Assessment: 57 year old female to begin heparin while apixaban is on hold in anticipation of vascular procedure on Friday.  Last dose of apixaban this AM at 8 am.  Initial PTT 42 seconds, heparin level falsely elevated  Goal of Therapy:  aPTT 66-102 seconds Monitor platelets by anticoagulation protocol: Yes   Plan:  Heparin to 1500 units / hr PTT in 8 hours Daily heparin level, PTT, CBC  Thank you Okey Regal, PharmD  11/25/2022,8:57 AM

## 2022-11-25 NOTE — Progress Notes (Addendum)
Patient ID: Deborah [name], female   DOB: [birth year], 57 y.o.   MRN: [id number] Patient is a 56 year old woman who is seen in follow-up for ulceration plantar aspect left heel.  Examination the wound bed is flat and stable with a few areas of thin eschar.  Ankle-brachial indices shows monophasic flow with diminished toe pressures bilaterally.  Review of the MRI scan shows no osteomyelitis.  I have discussed with vascular surgery and anticipate endovascular evaluation.  No surgical intervention indicated for the left heel at this time.  I will follow-up in the office 1 week after discharge.

## 2022-11-25 NOTE — Progress Notes (Addendum)
Progress Note  VASCULAR SURGERY ASSESSMENT & PLAN:   PERIPHERAL ARTERIAL DISEASE WITH ULCER: The patient has evidence of infrainguinal arterial occlusive disease on exam.  The patient has reasonable Doppler flow in the right foot however, she has an extensive wound on the heel.  There is no evidence of osteomyelitis.  If she is ready from a medical standpoint we will try to get her arteriogram done tomorrow.  Her CT of the abdomen is pending.  I did call the dialysis unit and ask them to not dialyze her in the morning so that she could have her her arteriogram.  I have written preop orders.  I will stop her heparin at 4 AM.  Cari Caraway, MD 10:55 AM   11/25/2022 6:44 AM Hospital Day 3  Subjective:  sleeping, wakes easily.  No complaints or questions.    afebrile  Vitals:   11/25/22 0400 11/25/22 0554  BP: 122/76   Pulse: 88 93  Resp: 13 13  Temp:  98.3 F (36.8 C)  SpO2: 95% 97%    Physical Exam: General:  no distress Lungs:  non labored   CBC    Component Value Date/Time   WBC 19.8 (H) 11/24/2022 0513   RBC 2.58 (L) 11/24/2022 0513   HGB 7.6 (L) 11/24/2022 2250   HGB 8.8 (L) 10/09/2019 0906   HGB 9.6 (L) 09/03/2019 1620   HCT 25.4 (L) 11/24/2022 2250   HCT 30.4 (L) 09/03/2019 1620   PLT 340 11/24/2022 0513   PLT 348 10/09/2019 0906   PLT 451 (H) 09/03/2019 1620   MCV 90.7 11/24/2022 0513   MCV 85 09/03/2019 1620   MCH 26.7 11/24/2022 0513   MCHC 29.5 (L) 11/24/2022 0513   RDW 16.5 (H) 11/24/2022 0513   RDW 13.6 09/03/2019 1620   LYMPHSABS 1.3 11/24/2022 0513   LYMPHSABS 2.2 09/03/2019 1620   MONOABS 0.7 11/24/2022 0513   EOSABS 0.4 11/24/2022 0513   EOSABS 0.3 09/03/2019 1620   BASOSABS 0.1 11/24/2022 0513   BASOSABS 0.1 09/03/2019 1620    BMET    Component Value Date/Time   NA 131 (L) 11/24/2022 0513   NA 137 09/20/2019 1530   K 4.1 11/24/2022 0513   CL 95 (L) 11/24/2022 0513   CO2 25 11/24/2022 0513   GLUCOSE 87 11/24/2022 0513   BUN 29  (H) 11/24/2022 0513   BUN 18 09/20/2019 1530   CREATININE 5.01 (H) 11/24/2022 0513   CALCIUM 7.9 (L) 11/24/2022 0513   GFRNONAA 9 (L) 11/24/2022 0513   GFRAA 49 (L) 11/30/2019 0356    INR    Component Value Date/Time   INR 1.8 (H) 11/23/2022 0023     Intake/Output Summary (Last 24 hours) at 11/25/2022 0644 Last data filed at 11/25/2022 0400 Gross per 24 hour  Intake 615 ml  Output 1760 ml  Net -1145 ml    ABI/TBI 11/24/2022: Right:  0.82/0.57 great toe pressure:  64 Left:  0.81/0.50 great toe pressure 57   Assessment/Plan:  57 y.o. female with right heel ulcer  Hospital Day 2  -plan for arteriogram tomorrow -MRI negative for acute osteomyelitis.   -pt's last dose of Eliquis was yesterday morning at 0800.  She is now on heparin gtt.  Hgb 6.9 yesterday and received 1 PRBC -possibly for arteriogram tomorrow.  Will make npo and Dr. Edilia Bo to determine plan.  -pt on dialysis M/W/F>  may need to reschedule given possibility of arteriogram on Friday.    Doreatha Massed, PA-C [facility] [phone] 11/25/2022 6:44 AM

## 2022-11-25 NOTE — TOC Initial Note (Signed)
Transition of Care [facility]) - Initial/Assessment Note    Patient Details  Name: Deborah [name] MRN: [id number] Date of Birth: [birth year]  [facility] CM/SW Contact:    Mearl Latin, LCSW Phone Number: 11/25/2022, 5:17 PM  Clinical Narrative:                 Patient admitted from home wheelchair bound and has outpatient dialysis MWF. Currently undergoing sepsis workup. TOC will continue to follow for needs.     Barriers to Discharge: Continued Medical Work up   Patient Goals and CMS Choice            Expected Discharge Plan and Services       Living arrangements for the past 2 months: Apartment                                      Prior Living Arrangements/Services Living arrangements for the past 2 months: Apartment   Patient language and need for interpreter reviewed:: Yes        Need for Family Participation in Patient Care: Yes (Comment) Care giver support system in place?: Yes (comment) Current home services: DME, Homehealth aide Criminal Activity/Legal Involvement Pertinent to Current Situation/Hospitalization: No - Comment as needed  Activities of Daily Living Home Assistive Devices/Equipment: Cane (specify quad or straight), Eyeglasses, Walker (specify type), Wheelchair, Shower chair with back, CBG Meter, Bedside commode/3-in-1 ADL Screening (condition at time of admission) Patient's cognitive ability adequate to safely complete daily activities?: No Is the patient deaf or have difficulty hearing?: No Does the patient have difficulty seeing, even when wearing glasses/contacts?: No Does the patient have difficulty concentrating, remembering, or making decisions?: No Patient able to express need for assistance with ADLs?: Yes Does the patient have difficulty dressing or bathing?: No Independently performs ADLs?: No Communication: Independent Dressing (OT): Needs assistance Is this a change from baseline?: Pre-admission  baseline Grooming: Needs assistance Is this a change from baseline?: Pre-admission baseline Feeding: Independent Bathing: Needs assistance Is this a change from baseline?: Change from baseline, expected to last <3 days Toileting: Needs assistance Is this a change from baseline?: Pre-admission baseline In/Out Bed: Needs assistance Is this a change from baseline?: Pre-admission baseline Walks in Home: Needs assistance Is this a change from baseline?: Pre-admission baseline Does the patient have difficulty walking or climbing stairs?: No Weakness of Legs: Right Weakness of Arms/Hands: None  Permission Sought/Granted                  Emotional Assessment       Orientation: : Oriented to Self, Oriented to Place, Oriented to  Time, Oriented to Situation Alcohol / Substance Use: Not Applicable Psych Involvement: No (comment)  Admission diagnosis:  Sepsis (HCC) [A41.9] Sepsis, due to unspecified organism, unspecified whether acute organ dysfunction present [facility]) [A41.9] Patient Active Problem List   Diagnosis Date Noted   Non-pressure chronic ulcer of right heel and midfoot limited to breakdown of skin (HCC) 11/24/2022   Sepsis (HCC) 11/23/2022   Elevated troponin 09/09/2022   Nausea & vomiting 09/09/2022   End stage renal disease on dialysis (HCC) 09/09/2022   Chronic a-fib (HCC) 09/09/2022   Atrial fibrillation with RVR (HCC)-resolved 09/09/2022   Peripheral neuropathy 09/09/2022   (HFpEF) heart failure with preserved ejection fraction (HCC) 09/09/2022   Hypotension 09/09/2022   CHF (congestive heart failure) (HCC) 07/13/2022   HCAP (healthcare-associated pneumonia) 06/19/2022  Acute renal failure superimposed on stage 4 chronic kidney disease (HCC) 05/29/2022   Acute encephalopathy 05/29/2022   Hyponatremia 05/29/2022   Chronic anemia 05/22/2022   Hyperosmolar hyperglycemic state (HHS) (HCC) 05/11/2022   Hyperosmolar non-ketotic state due to type 2 diabetes mellitus  (HCC) 05/11/2022   Leukocytosis 05/11/2022   Edema of left upper extremity 03/21/2022   Tachycardia 03/18/2022   Atypical chest pain 09/10/2021   Acute pyelonephritis    Acute kidney injury superimposed on chronic kidney disease (HCC) 08/16/2021   Acute on chronic kidney failure (HCC) 08/15/2021   PAF (paroxysmal atrial fibrillation) (HCC)    Chronic pain 09/04/2019   Malignant carcinoid tumor of duodenum (HCC)    Nonalcoholic fatty liver disease 06/05/2019   Restrictive lung disease secondary to obesity    History of gastric ulcer    Fibroid uterus 02/23/2019   Abdominal pain 07/17/2018   Anxiety 11/29/2017   Spinal stenosis, lumbar region with neurogenic claudication 08/03/2017   GERD (gastroesophageal reflux disease) 03/19/2017   Depression 03/19/2017   Morbid obesity (HCC)    Normocytic anemia 08/16/2016   Diabetic gastroparesis (HCC) 08/16/2016   Gout 06/05/2016   Chronic hypokalemia 09/26/2015   Hypomagnesemia and hypokalemia 09/26/2015   Insulin dependent type 2 diabetes mellitus (HCC) 05/25/2015   Chronic kidney disease (CKD), stage IV (severe) (HCC) 05/25/2015   Essential hypertension 09/28/2013   PCP:  Oneita Hurt, No Pharmacy:   [facility] - [place], [place] - [address] [place] [address] Phone: [phone] Fax: [phone]  [facility] [address] [place] [address] Phone: [phone] Fax: [phone]  [id number] [place], [place] - [address] [facility] [address] [place] [address] Phone: [phone] Fax: [phone]     Social Determinants of Health (SDOH) Social History: SDOH Screenings   Food Insecurity: No Food Insecurity (11/23/2022)  Housing: Low Risk  (11/23/2022)  Transportation Needs: No Transportation Needs (11/23/2022)  Utilities: Not At Risk (11/23/2022)  Depression (PHQ2-9): High Risk (11/18/2022)   Financial Resource Strain: Low Risk (03/23/2021)   [facility] from [facility])  Physical Activity: Not on File (10/31/2017)   Received from [place], [place]  Social Connections: Unknown (07/19/2021)   Received from [facility]  Stress: Low Risk (03/23/2021)   [facility])  Tobacco Use: Low Risk  (11/18/2022)   SDOH Interventions:     Readmission Risk Interventions    11/25/2022    5:16 PM 07/05/2022    1:20 PM  Readmission Risk Prevention Plan  Transportation Screening Complete Complete  Medication Review (RN Care Manager) Complete Referral to Pharmacy  PCP or Specialist appointment within 3-5 days of discharge Complete Complete  HRI or Home Care Consult Complete Complete  SW Recovery Care/Counseling Consult Complete Complete  Palliative Care Screening Not Applicable Not Applicable  Skilled Nursing Facility Not Applicable Not Applicable

## 2022-11-25 NOTE — Progress Notes (Signed)
[name] Progress Note   Subjective: Seen in room.Sleepy this am. No new complaints.   Objective Vitals:   11/24/22 2118 11/25/22 0003 11/25/22 0400 11/25/22 0554  BP: (!) 101/48 122/64 122/76   Pulse:  93 88 93  Resp: (!) 22 13 13 13   Temp: 98.1 F (36.7 C) 97.9 F (36.6 C)  98.3 F (36.8 C)  TempSrc: Oral Oral  Oral  SpO2:  95% 95% 97%  Weight:          Additional Objective Labs: Basic Metabolic Panel: Recent Labs  Lab 11/23/22 0023 11/23/22 0218 11/23/22 1545 11/24/22 0513 11/25/22 0609  NA 127* 127*  --  131* 131*  K 4.0 3.7  --  4.1 3.1*  CL 86*  --   --  95* 95*  CO2 16*  --   --  25 25  GLUCOSE 270*  --   --  87 76  BUN 68*  --   --  29* 18  CREATININE 10.14*  --   --  5.01* 3.54*  CALCIUM 9.3  --   --  7.9* 7.6*  PHOS  --   --  8.4*  --  3.4   CBC: Recent Labs  Lab 11/23/22 0023 11/23/22 0218 11/24/22 0513 11/24/22 2250 11/25/22 0609  WBC 30.3*  --  19.8*  --  19.1*  NEUTROABS 27.1*  --  17.0*  --  16.4*  HGB 7.7*   < > 6.9* 7.6* 7.6*  HCT 26.6*   < > 23.4* 25.4* 26.8*  MCV 91.7  --  90.7  --  90.8  PLT 345  --  340  --  429*   < > = values in this interval not displayed.   Blood Culture    Component Value Date/Time   SDES BLOOD SITE NOT SPECIFIED 11/23/2022 0023   SPECREQUEST  11/23/2022 0023    BOTTLES DRAWN AEROBIC AND ANAEROBIC Blood Culture adequate volume   CULT  11/23/2022 0023    NO GROWTH 2 DAYS Performed at [facility], [address] [place], [place] [address]    REPTSTATUS PENDING 11/23/2022 0023     Physical Exam General: Lying in bed, nad Heart: Regular rate Lungs: Bilateral breath sounds Abdomen: soft non -tender Extremities: trace LE edema : R ankle wrapped Dialysis Access: R TDC  Medications:  ceFEPime (MAXIPIME) IV 1 g (11/25/22 0123)   heparin 1,500 Units/hr (11/25/22 0911)   vancomycin Stopped (11/24/22 2115)    doxercalciferol  4 mcg Intravenous Q M,W,F-HD   DULoxetine  40 mg  Oral Daily   ferric citrate  420 mg Oral TID WC   insulin aspart  0-15 Units Subcutaneous TID WC   insulin glargine-yfgn  30 Units Subcutaneous Daily   leptospermum manuka honey  1 Application Topical Daily   metroNIDAZOLE  500 mg Oral Q8H   midodrine  5 mg Oral TID WC   mirtazapine  15 mg Oral QHS   pantoprazole  40 mg Oral Daily   sodium chloride flush  3 mL Intravenous Q12H      06/30/2022 -- > Dr Chestine Spore of [facility] did L BC AVF placement     OP HD: [facility] MWF  4h  400/800    126.9kg  RIJ TDC/  LUA AVF (maturing) Hep 5000+ - hectorol 7 mcg IV tiw - venofer 50mg  weekly IV - mircera 225 mcg IV q 2 wks, last 9/06, due 9/20     Assessment/ Plan: Sepsis/ SSTI -  initially concern for R ankle osteomyelitis.  Responding to IV abx and IV fluids. Blood cx neg to date.  PAD/R ankle ulcer - [facility] following. For arteriogram Fri. H/o metastatic carcinoid tumor ESRD - on HD MWF.Back on schedule. Next HD 9/20 H/o HTN - BP's soft, hold all home HTN meds Volume - BP ok. S/p fluid bolus. Volume up on exam, will plan UF 2-2.5 L as tolerated.  Anemia esrd - Hb 7.6 s/p prbcs 9/18. Next  esa is due on 9/27. Follow.  MBD ckd - CCa in range. Phos above goal. Cont Auryxia binder Hyponatremia - due to vol overload, see above H/o afib H/o chronic pain Deconditioning - pt is WC to chair/ bed dependent  Tomasa Blase PA-C [facility] 11/25/2022,12:29 PM

## 2022-11-25 NOTE — H&P (View-Only) (Signed)
Progress Note  VASCULAR SURGERY ASSESSMENT & PLAN:   PERIPHERAL ARTERIAL DISEASE WITH ULCER: The patient has evidence of infrainguinal arterial occlusive disease on exam.  The patient has reasonable Doppler flow in the right foot however, she has an extensive wound on the heel.  There is no evidence of osteomyelitis.  If she is ready from a medical standpoint we will try to get her arteriogram done tomorrow.  Her CT of the abdomen is pending.  I did call the dialysis unit and ask them to not dialyze her in the morning so that she could have her her arteriogram.  I have written preop orders.  I will stop her heparin at 4 AM.  Cari Caraway, MD 10:55 AM   11/25/2022 6:44 AM Hospital Day 3  Subjective:  sleeping, wakes easily.  No complaints or questions.    afebrile  Vitals:   11/25/22 0400 11/25/22 0554  BP: 122/76   Pulse: 88 93  Resp: 13 13  Temp:  98.3 F (36.8 C)  SpO2: 95% 97%    Physical Exam: General:  no distress Lungs:  non labored   CBC    Component Value Date/Time   WBC 19.8 (H) 11/24/2022 0513   RBC 2.58 (L) 11/24/2022 0513   HGB 7.6 (L) 11/24/2022 2250   HGB 8.8 (L) 10/09/2019 0906   HGB 9.6 (L) 09/03/2019 1620   HCT 25.4 (L) 11/24/2022 2250   HCT 30.4 (L) 09/03/2019 1620   PLT 340 11/24/2022 0513   PLT 348 10/09/2019 0906   PLT 451 (H) 09/03/2019 1620   MCV 90.7 11/24/2022 0513   MCV 85 09/03/2019 1620   MCH 26.7 11/24/2022 0513   MCHC 29.5 (L) 11/24/2022 0513   RDW 16.5 (H) 11/24/2022 0513   RDW 13.6 09/03/2019 1620   LYMPHSABS 1.3 11/24/2022 0513   LYMPHSABS 2.2 09/03/2019 1620   MONOABS 0.7 11/24/2022 0513   EOSABS 0.4 11/24/2022 0513   EOSABS 0.3 09/03/2019 1620   BASOSABS 0.1 11/24/2022 0513   BASOSABS 0.1 09/03/2019 1620    BMET    Component Value Date/Time   NA 131 (L) 11/24/2022 0513   NA 137 09/20/2019 1530   K 4.1 11/24/2022 0513   CL 95 (L) 11/24/2022 0513   CO2 25 11/24/2022 0513   GLUCOSE 87 11/24/2022 0513   BUN 29  (H) 11/24/2022 0513   BUN 18 09/20/2019 1530   CREATININE 5.01 (H) 11/24/2022 0513   CALCIUM 7.9 (L) 11/24/2022 0513   GFRNONAA 9 (L) 11/24/2022 0513   GFRAA 49 (L) 11/30/2019 0356    INR    Component Value Date/Time   INR 1.8 (H) 11/23/2022 0023     Intake/Output Summary (Last 24 hours) at 11/25/2022 0644 Last data filed at 11/25/2022 0400 Gross per 24 hour  Intake 615 ml  Output 1760 ml  Net -1145 ml    ABI/TBI 11/24/2022: Right:  0.82/0.57 great toe pressure:  64 Left:  0.81/0.50 great toe pressure 57   Assessment/Plan:  57 y.o. female with right heel ulcer  Hospital Day 2  -plan for arteriogram tomorrow -MRI negative for acute osteomyelitis.   -pt's last dose of Eliquis was yesterday morning at 0800.  She is now on heparin gtt.  Hgb 6.9 yesterday and received 1 PRBC -possibly for arteriogram tomorrow.  Will make npo and Dr. Edilia Bo to determine plan.  -pt on dialysis M/W/F>  may need to reschedule given possibility of arteriogram on Friday.    Doreatha Massed, PA-C [facility] [phone] 11/25/2022 6:44 AM

## 2022-11-25 NOTE — Progress Notes (Signed)
PROGRESS NOTE        PATIENT DETAILS Name: Deborah [name] Age: 57 y.o. Sex: female Date of Birth: 1965-04-07 Admit Date: 11/22/2022 Admitting Physician Dolly Rias, MD PCP:Pcp, No  Brief Summary: Patient is a 58 y.o.  female with history of ESRD, PAF on Eliquis, HFpEF, HTN, DM-2, malignant carcinoid, NAFLD-who presented with weakness/fatigue several episodes of nausea/vomiting-found to have sepsis-likely due to soft tissue infection involving right ankle.  See below for further details.  Significant events: 9/16>> admit to [facility]  Significant studies: 9/17>> CXR: No PNA 9/17>> RUQ ultrasound: No acute abnormality 9/18>> MRI right foot: No acute osteomyelitis.  Significant microbiology data: 9/17>> COVID/influenza/RSV PCR: Negative 9/17>> blood culture: No growth  Procedures: None  Consults: Nephrology Orthopedics  Subjective: Some mild lower abdominal pain today but no diarrhea.  No vomiting.  Some chronic pain in her lower extremities-asking for Dilaudid.  Objective: Vitals: Blood pressure 122/76, pulse 93, temperature 98.3 F (36.8 C), temperature source Oral, resp. rate 13, weight 133 kg, last menstrual period 10/10/2012, SpO2 97%.   Exam: Gen Exam:Alert awake-not in any distress HEENT:atraumatic, normocephalic Chest: B/L clear to auscultation anteriorly CVS:S1S2 regular Abdomen:soft non tender, non distended Extremities:+ Edema-dry appearing ulcer in the right heel area.  See pictures taken on 9/18. Neurology: Non focal Skin: no rash        Pertinent Labs/Radiology:    Latest Ref Rng & Units 11/25/2022    6:09 AM 11/24/2022   10:50 PM 11/24/2022    5:13 AM  CBC  WBC 4.0 - 10.5 K/uL 19.1   19.8   Hemoglobin 12.0 - 15.0 g/dL 7.6  7.6  6.9   Hematocrit 36.0 - 46.0 % 26.8  25.4  23.4   Platelets 150 - 400 K/uL 429   340     Lab Results  Component Value Date   NA 131 (L) 11/25/2022   K 3.1 (L) 11/25/2022   CL 95 (L)  11/25/2022   CO2 25 11/25/2022      Assessment/Plan: Sepsis Unclear etiology-initially thought to have osteomyelitis of right ankle but MRI is negative-wound is chronic and does not appear overtly infected. Cultures negative so far No other sources of infection grossly apparent-has some mild lower abdominal pain but abdominal exam is relatively benign.  Given no obvious foci of infection-checking CT abdomen/pelvis today.  In the interim-continue broad-spectrum antimicrobial therapy.   Chronic right heel ulcer No evidence of osteomyelitis on MRI Vascular surgery planning angiogram 9/20 Dr. Lajoyce Corners following.  ESRD on HD Nephrology following and directing care BP soft-adding midodrine  Normocytic anemia Multifactorial-chronic anemia due to ESRD/CKD-worsened by anemia due to acute illness No evidence of GI bleeding/blood loss CBC stable-s/p 1 unit of PRBC on 9/18. Follow CBC periodically.    Minimally elevated troponin Demand ischemia No chest pain/anginal symptoms Supportive care  Transaminitis Unclear etiology but probably due to sepsis physiology/underlying carcinoid syndrome Resolved  Hyponatremia Mild-asymptomatic Should improve with volume removal with HD  Chronic HFpEF Some leg edema but otherwise stable Volume removal with HD  PAF Telemetry monitoring Eliquis held in anticipation of angiogram-on IV heparin  HTN All antihypertensives held-as blood pressure remains soft due to sepsis physiology  History of duodenal carcinoma/carcinoid-s/p resection 2021 Chronic pain syndrome Narcotic dependence Supportive care Continue as needed narcotics-will need to be cautious due to hypotension  DM-2 (A1c 8.19/13) CBG  borderline  Decrease Semglee to 30 units daily+ stop premea NovoLog with meals-continue SSI Follow  Recent Labs    11/24/22 1249 11/24/22 2116 11/25/22 0818  GLUCAP 76 75 87     Mood disorder Relatively  stable Cymbalta/Remeron  Debility/deconditioning Mostly wheelchair-bound-able to take a few steps to go to the bathroom etc. PT/OT  Morbid Obesity: Estimated body mass index is 47.33 kg/m as calculated from the following:   Height as of 11/18/22: 5\' 6"  (1.676 m).   Weight as of this encounter: 133 kg.   Code status:   Code Status: Full Code   DVT Prophylaxis: IV heparin    Family Communication: None at bedside   Disposition Plan: Status is: Inpatient Remains inpatient appropriate because: Severity of illness   Planned Discharge Destination:Home   Diet: Diet Order             Diet NPO time specified  Diet effective midnight           Diet renal/carb modified with fluid restriction Diet-HS Snack? Nothing; Fluid restriction: 1200 mL Fluid; Room service appropriate? Yes; Fluid consistency: Thin  Diet effective now                     Antimicrobial agents: Anti-infectives (From admission, onward)    Start     Dose/Rate Route Frequency Ordered Stop   11/24/22 1200  vancomycin (VANCOCIN) IVPB 1000 mg/200 mL premix        1,000 mg 200 mL/hr over 60 Minutes Intravenous Every M-W-F (Hemodialysis) 11/24/22 0826     11/24/22 0100  ceFEPIme (MAXIPIME) 1 g in sodium chloride 0.9 % 100 mL IVPB        1 g 200 mL/hr over 30 Minutes Intravenous Every 24 hours 11/23/22 0332     11/23/22 0900  metroNIDAZOLE (FLAGYL) tablet 500 mg        500 mg Oral Every 8 hours 11/23/22 0332     11/23/22 0343  vancomycin variable dose per unstable renal function (pharmacist dosing)  Status:  Discontinued         Does not apply See admin instructions 11/23/22 0343 11/25/22 0852   11/23/22 0100  aztreonam (AZACTAM) 2 g in sodium chloride 0.9 % 100 mL IVPB  Status:  Discontinued        2 g 200 mL/hr over 30 Minutes Intravenous  Once 11/23/22 0045 11/23/22 0051   11/23/22 0100  metroNIDAZOLE (FLAGYL) IVPB 500 mg        500 mg 100 mL/hr over 60 Minutes Intravenous  Once 11/23/22 0045 11/23/22  0231   11/23/22 0100  vancomycin (VANCOCIN) IVPB 1000 mg/200 mL premix  Status:  Discontinued        1,000 mg 200 mL/hr over 60 Minutes Intravenous  Once 11/23/22 0045 11/23/22 0051   11/23/22 0100  vancomycin (VANCOCIN) 2,500 mg in sodium chloride 0.9 % 500 mL IVPB        2,500 mg 262.5 mL/hr over 120 Minutes Intravenous  Once 11/23/22 0051 11/23/22 0506   11/23/22 0100  ceFEPIme (MAXIPIME) 2 g in sodium chloride 0.9 % 100 mL IVPB        2 g 200 mL/hr over 30 Minutes Intravenous STAT 11/23/22 0051 11/23/22 0204        MEDICATIONS: Scheduled Meds:  doxercalciferol  4 mcg Intravenous Q M,W,F-HD   DULoxetine  40 mg Oral Daily   ferric citrate  420 mg Oral TID WC   insulin aspart  0-15 Units Subcutaneous TID WC  insulin aspart  10 Units Subcutaneous TID WC   insulin glargine-yfgn  40 Units Subcutaneous Daily   leptospermum manuka honey  1 Application Topical Daily   metroNIDAZOLE  500 mg Oral Q8H   midodrine  5 mg Oral TID WC   mirtazapine  15 mg Oral QHS   pantoprazole  40 mg Oral Daily   sodium chloride flush  3 mL Intravenous Q12H   Continuous Infusions:  ceFEPime (MAXIPIME) IV 1 g (11/25/22 0123)   heparin 1,500 Units/hr (11/25/22 0911)   vancomycin Stopped (11/24/22 2115)   PRN Meds:.albuterol, HYDROmorphone **OR** HYDROmorphone, ondansetron (ZOFRAN) IV   I have personally reviewed following labs and imaging studies  LABORATORY DATA: CBC: Recent Labs  Lab 11/23/22 0023 11/23/22 0218 11/24/22 0513 11/24/22 2250 11/25/22 0609  WBC 30.3*  --  19.8*  --  19.1*  NEUTROABS 27.1*  --  17.0*  --  16.4*  HGB 7.7* 7.8* 6.9* 7.6* 7.6*  HCT 26.6* 23.0* 23.4* 25.4* 26.8*  MCV 91.7  --  90.7  --  90.8  PLT 345  --  340  --  429*    Basic Metabolic Panel: Recent Labs  Lab 11/23/22 0023 11/23/22 0218 11/23/22 1545 11/24/22 0513 11/25/22 0609  NA 127* 127*  --  131* 131*  K 4.0 3.7  --  4.1 3.1*  CL 86*  --   --  95* 95*  CO2 16*  --   --  25 25  GLUCOSE 270*   --   --  87 76  BUN 68*  --   --  29* 18  CREATININE 10.14*  --   --  5.01* 3.54*  CALCIUM 9.3  --   --  7.9* 7.6*  PHOS  --   --  8.4*  --  3.4    GFR: Estimated Creatinine Clearance: 24.3 mL/min (A) (by C-G formula based on SCr of 3.54 mg/dL (H)).  Liver Function Tests: Recent Labs  Lab 11/23/22 0023 11/24/22 0513 11/25/22 0609  AST 47* 46* 41  ALT 15 15 16   ALKPHOS 86 54 59  BILITOT 1.3* 1.3* 1.1  PROT 7.8 6.1* 6.6  ALBUMIN 2.2* 1.8* 1.9*  1.9*   No results for input(s): "LIPASE", "AMYLASE" in the last 168 hours. No results for input(s): "AMMONIA" in the last 168 hours.  Coagulation Profile: Recent Labs  Lab 11/23/22 0023  INR 1.8*    Cardiac Enzymes: No results for input(s): "CKTOTAL", "CKMB", "CKMBINDEX", "TROPONINI" in the last 168 hours.  BNP (last 3 results) No results for input(s): "PROBNP" in the last 8760 hours.  Lipid Profile: No results for input(s): "CHOL", "HDL", "LDLCALC", "TRIG", "CHOLHDL", "LDLDIRECT" in the last 72 hours.  Thyroid Function Tests: No results for input(s): "TSH", "T4TOTAL", "FREET4", "T3FREE", "THYROIDAB" in the last 72 hours.  Anemia Panel: No results for input(s): "VITAMINB12", "FOLATE", "FERRITIN", "TIBC", "IRON", "RETICCTPCT" in the last 72 hours.  Urine analysis:    Component Value Date/Time   COLORURINE STRAW (A) 06/19/2022 0638   APPEARANCEUR HAZY (A) 06/19/2022 0638   LABSPEC 1.010 06/19/2022 0638   PHURINE 5.0 06/19/2022 0638   GLUCOSEU NEGATIVE 06/19/2022 0638   HGBUR SMALL (A) 06/19/2022 0638   BILIRUBINUR NEGATIVE 06/19/2022 0638   KETONESUR NEGATIVE 06/19/2022 0638   PROTEINUR 30 (A) 06/19/2022 0638   UROBILINOGEN 0.2 10/02/2013 2108   NITRITE NEGATIVE 06/19/2022 0638   LEUKOCYTESUR LARGE (A) 06/19/2022 0638    Sepsis Labs: Lactic Acid, Venous    Component Value Date/Time  LATICACIDVEN 1.0 11/23/2022 0914    MICROBIOLOGY: Recent Results (from the past 240 hour(s))  Resp panel by RT-PCR (RSV, Flu  A&B, Covid) Anterior Nasal Swab     Status: None   Collection Time: 11/23/22 12:23 AM   Specimen: Anterior Nasal Swab  Result Value Ref Range Status   SARS Coronavirus 2 by RT PCR NEGATIVE NEGATIVE Final   Influenza A by PCR NEGATIVE NEGATIVE Final   Influenza B by PCR NEGATIVE NEGATIVE Final    Comment: (NOTE) The Xpert Xpress SARS-CoV-2/FLU/RSV plus assay is intended as an aid in the diagnosis of influenza from Nasopharyngeal swab specimens and should not be used as a sole basis for treatment. Nasal washings and aspirates are unacceptable for Xpert Xpress SARS-CoV-2/FLU/RSV testing.  Fact Sheet for Patients: BloggerCourse.com  Fact Sheet for Healthcare Providers: SeriousBroker.it  This test is not yet approved or cleared by the [place] FDA and has been authorized for detection and/or diagnosis of SARS-CoV-2 by FDA under an Emergency Use Authorization (EUA). This EUA will remain in effect (meaning this test can be used) for the duration of the COVID-19 declaration under Section 564(b)(1) of the Act, 21 U.S.C. section 360bbb-3(b)(1), unless the authorization is terminated or revoked.     Resp Syncytial Virus by PCR NEGATIVE NEGATIVE Final    Comment: (NOTE) Fact Sheet for Patients: BloggerCourse.com  Fact Sheet for Healthcare Providers: SeriousBroker.it  This test is not yet approved or cleared by the [place] FDA and has been authorized for detection and/or diagnosis of SARS-CoV-2 by FDA under an Emergency Use Authorization (EUA). This EUA will remain in effect (meaning this test can be used) for the duration of the COVID-19 declaration under Section 564(b)(1) of the Act, 21 U.S.C. section 360bbb-3(b)(1), unless the authorization is terminated or revoked.  Performed at [facility], [address] [place], [place] [address]   Blood Culture (routine x 2)      Status: None (Preliminary result)   Collection Time: 11/23/22 12:23 AM   Specimen: BLOOD  Result Value Ref Range Status   Specimen Description BLOOD SITE NOT SPECIFIED  Final   Special Requests   Final    BOTTLES DRAWN AEROBIC AND ANAEROBIC Blood Culture adequate volume   Culture   Final    NO GROWTH 2 DAYS Performed at [facility], [address] [place], [place] [address]    Report Status PENDING  Incomplete    RADIOLOGY STUDIES/RESULTS: VAS [place] ABI WITH/WO TBI  Result Date: 11/24/2022  LOWER EXTREMITY DOPPLER STUDY Patient Name:  ELTHA WURM  Date of Exam:   11/24/2022 Medical Rec #: [id number]             Accession #:    [id number] Date of Birth: 04-Jan-1966             Patient Gender: F Patient Age:   19 years Exam Location:  [facility] Procedure:      VAS [place] ABI WITH/WO TBI Referring Phys: [facility] [name] --------------------------------------------------------------------------------  Indications: Ulceration. High Risk Factors: None, hypertension, no history of smoking. Other Factors: ESRD (HD), CHF, Afib,.  Limitations: Today's exam was limited due to patient positioning and patient              intolerant to cuff pressure. Comparison Study: No previous exams Performing Technologist: Hill, Jody RVT, RDMS  Examination Guidelines: A complete evaluation includes at minimum, Doppler waveform signals and systolic blood pressure reading at the level of bilateral brachial, anterior  tibial, and posterior tibial arteries, when vessel segments are accessible. Bilateral testing is considered an integral part of a complete examination. Photoelectric Plethysmograph (PPG) waveforms and toe systolic pressure readings are included as required and additional duplex testing as needed. Limited examinations for reoccurring indications may be performed as noted.  ABI Findings: +---------+------------------+-----+----------+--------+ Right    Rt Pressure (mmHg)IndexWaveform   Comment  +---------+------------------+-----+----------+--------+ Brachial 113                    triphasic          +---------+------------------+-----+----------+--------+ DP       93                0.82 monophasic         +---------+------------------+-----+----------+--------+ Great Toe64                0.57 Abnormal           +---------+------------------+-----+----------+--------+ +---------+------------------+-----+----------+---------+ Left     Lt Pressure (mmHg)IndexWaveform  Comment   +---------+------------------+-----+----------+---------+ Brachial                                  HD access +---------+------------------+-----+----------+---------+ PTA                             biphasic            +---------+------------------+-----+----------+---------+ DP       91                0.81 monophasic          +---------+------------------+-----+----------+---------+ Great Toe57                0.50 Abnormal            +---------+------------------+-----+----------+---------+ Unable to obtain RLE PTA waveform/pressure due to patient position - unable to move due to pain. Unable to obtain LLE PTA pressure due to pain intolerance.  Summary: Right: Resting right ankle-brachial index indicates mild right lower extremity arterial disease. The right toe-brachial index is abnormal. Left: Resting left ankle-brachial index indicates mild left lower extremity arterial disease. The left toe-brachial index is abnormal. *See table(s) above for measurements and observations.  Electronically signed by Coral Else MD on 11/24/2022 at 6:45:56 PM.    Final    MR FOOT RIGHT WO CONTRAST  Result Date: 11/24/2022 CLINICAL DATA:  Diabetic ulcer on right heel. EXAM: MRI OF THE RIGHT HINDFOOT WITHOUT CONTRAST TECHNIQUE: Multiplanar, multisequence MR imaging of the right hindfoot from the calcaneus through the navicular was performed. No intravenous contrast was administered.  COMPARISON:  Right ankle radiographs 10/26/2022, right foot radiographs 05/12/2022 FINDINGS: This study was not performed along the standard planes for an ankle MRI. Three-plane hindfoot protocol was used for evaluation for osteomyelitis. Despite efforts by the technologist and patient, moderate motion artifact is present on today's exam and could not be eliminated. This reduces exam sensitivity and specificity. Soft tissues There is a plantar heel ulcer measuring up to approximately 18 mm along the longitudinal dimension of the foot, 15 mm along the transverse dimension of the foot, and 3 mm in depth (sagittal image 12 and axial series 6, image 19). There is moderate underlying subcutaneous fat edema, and this contacts the posterior, plantar, lateral aspect of the calcaneus near the plantar fascia origin (coronal series 6 images 16 through 19). There is some minimal heterogeneity of the  cortical signal at the lateral aspect of the plantar fascia origin (axial image 17/24), however no definitive cortical thinning is seen. There is no adjacent posteroinferior calcaneal marrow edema. There does not appear to be acute osteomyelitis within the calcaneus closest to the plantar heel ulcer. There is moderate diffuse subcutaneous fat edema and swelling. Bones/Joint/Cartilage There is mild-to-moderate subchondral marrow edema within the posterior subtalar joint and the calcaneocuboid joint, associated with moderate cartilage thinning and likely degenerative. Moderate dorsal talonavicular degenerative osteophytes. Ligaments Not well evaluated given obliquity of the images. Muscles and Tendons There is moderate to high-grade atrophy and fatty infiltration of the abductor digiti minimi muscle. More mild fatty infiltration of the abductor hallucis muscle. Moderate abductor hallucis, quadratus plantae, and extensor digitorum brevis muscle edema. IMPRESSION: 1. Shallow plantar heel ulcer with moderate underlying subcutaneous fat  edema that contacts the posterior, plantar, lateral aspect of the calcaneus near the plantar fascia origin. There is some minimal heterogeneity of the cortical signal at the lateral aspect of the plantar fascia origin, however no definitive cortical thinning or marrow edema is seen within the adjacent calcaneus. No MR evidence of acute osteomyelitis. 2. Moderate degenerative changes of the posterior subtalar joint and calcaneocuboid joint. Electronically Signed   By: Neita Garnet M.D.   On: 11/24/2022 14:03     LOS: 2 days   Jeoffrey Massed, MD  [facility]    To contact the attending provider between 7A-7P or the covering provider during after hours 7P-7A, please log into the web site [email or web address] and access using universal [place] password for that web site. If you do not have the password, please call the hospital operator.  11/25/2022, 10:15 AM

## 2022-11-25 NOTE — Progress Notes (Signed)
OT Cancellation Note  Patient Details Name: Deborah [name] MRN: [id number] DOB: 10/07/1965   Cancelled Treatment:    Reason Eval/Treat Not Completed: Patient declined, no reason specified (Pt refused mobility to EOB with PT just moments prior, discussed with the PT that pt will likely not tolerate a session today. OT to follow-up with pt as able.)  11/25/2022  AB, OTR/L  Acute Rehabilitation Services  Office: [phone]   Deborah [name] 11/25/2022, 3:10 PM

## 2022-11-25 NOTE — Progress Notes (Signed)
Patient refuse to take full dose of dextrose gel earlier. Patient was encourage and just finish dose at this time. Last BS 69. Will recheck in 15 min.

## 2022-11-25 NOTE — Progress Notes (Addendum)
Patient restless and throwing her body back and forth in the bed. She's hard to keep electrodes on, also PIV was found to be pulled out because of this thrashing. Heparin gtt on hold until new PIV can be placed by IV team. Pt will also start hysterically crying during our conversation and then stop within a few seconds.   Patient states she is allergic to many items including: soap, CHG wipes, sheets, gowns and the thicker blankets. Pt laying on thin blanket with a bed pad.

## 2022-11-25 NOTE — Progress Notes (Signed)
PT Cancellation Note  Patient Details Name: Deborah [name] MRN: [id number] DOB: [birth year]   Cancelled Treatment:    Reason Eval/Treat Not Completed: Other (comment)  Patient reports (and RN confirms) that she just sat up on EOB with nursing <30 minutes ago. States it made her bottom hurt and does not want to complete PT eval at this time. Educated pt on need for her PRAFO at all times to offload heel and she agreed to let me don it. Educated pt on Dr. Audrie Lia instruction to  "put as little weight as possible through Rt foot--ideally NWB."   Jerolyn Center, PT Acute Rehabilitation Services  Office [phone]   Zena Amos 11/25/2022, 3:09 PM

## 2022-11-26 ENCOUNTER — Encounter (HOSPITAL_COMMUNITY)
Admission: EM | Disposition: A | Discharge status: Home Health | Payer: Self-pay | Source: Home / Self Care | Attending: Internal Medicine

## 2022-11-26 DIAGNOSIS — A419 Sepsis, unspecified organism: Secondary | ICD-10-CM | POA: Diagnosis not present

## 2022-11-26 DIAGNOSIS — N186 End stage renal disease: Secondary | ICD-10-CM | POA: Diagnosis not present

## 2022-11-26 DIAGNOSIS — E1151 Type 2 diabetes mellitus with diabetic peripheral angiopathy without gangrene: Secondary | ICD-10-CM | POA: Diagnosis not present

## 2022-11-26 DIAGNOSIS — I10 Essential (primary) hypertension: Secondary | ICD-10-CM | POA: Diagnosis not present

## 2022-11-26 LAB — BASIC METABOLIC PANEL
Anion gap: 12 (ref 5–15)
BUN: 22 mg/dL — ABNORMAL HIGH (ref 6–20)
CO2: 23 mmol/L (ref 22–32)
Calcium: 7.7 mg/dL — ABNORMAL LOW (ref 8.9–10.3)
Chloride: 97 mmol/L — ABNORMAL LOW (ref 98–111)
Creatinine, Ser: 4.06 mg/dL — ABNORMAL HIGH (ref 0.44–1.00)
GFR, Estimated: 12 mL/min — ABNORMAL LOW (ref >60)
Glucose, Bld: 74 mg/dL (ref 70–99)
Potassium: 3 mmol/L — ABNORMAL LOW (ref 3.5–5.1)
Sodium: 132 mmol/L — ABNORMAL LOW (ref 135–145)

## 2022-11-26 LAB — CBC
HCT: 27 % — ABNORMAL LOW (ref 36.0–46.0)
Hemoglobin: 7.8 g/dL — ABNORMAL LOW (ref 12.0–15.0)
MCH: 27.1 pg (ref 26.0–34.0)
MCHC: 28.9 g/dL — ABNORMAL LOW (ref 30.0–36.0)
MCV: 93.8 fL (ref 80.0–100.0)
Platelets: 488 10*3/uL — ABNORMAL HIGH (ref 150–400)
RBC: 2.88 MIL/uL — ABNORMAL LOW (ref 3.87–5.11)
RDW: 16.5 % — ABNORMAL HIGH (ref 11.5–15.5)
WBC: 17.1 10*3/uL — ABNORMAL HIGH (ref 4.0–10.5)
nRBC: 0 % (ref 0.0–0.2)

## 2022-11-26 LAB — GLUCOSE, CAPILLARY
Glucose-Capillary: 99 mg/dL (ref 70–99)
Glucose-Capillary: 121 mg/dL — ABNORMAL HIGH (ref 70–99)
Glucose-Capillary: 140 mg/dL — ABNORMAL HIGH (ref 70–99)
Glucose-Capillary: 142 mg/dL — ABNORMAL HIGH (ref 70–99)

## 2022-11-26 LAB — APTT: aPTT: 47 seconds — ABNORMAL HIGH (ref 24–36)

## 2022-11-26 LAB — HEPARIN LEVEL (UNFRACTIONATED): Heparin Unfractionated: 0.64 IU/mL (ref 0.30–0.70)

## 2022-11-26 SURGERY — INVASIVE LAB ABORTED CASE

## 2022-11-26 MED ORDER — FAMOTIDINE IN NACL 20-0.9 MG/50ML-% IV SOLN: INTRAVENOUS | Status: DC | PRN

## 2022-11-26 MED ORDER — DIPHENHYDRAMINE HCL 50 MG/ML IJ SOLN: 25.0000 mg | Freq: Once | INTRAMUSCULAR | Status: DC

## 2022-11-26 MED ORDER — LIDOCAINE HCL (PF) 1 % IJ SOLN
INTRAMUSCULAR | Status: AC
  Filled 2022-11-26: qty 30

## 2022-11-26 MED ORDER — SODIUM CHLORIDE 0.9% FLUSH: 10.0000 mL | INTRAVENOUS | Status: DC | PRN

## 2022-11-26 MED ORDER — METHYLPREDNISOLONE SODIUM SUCC 125 MG IJ SOLR
INTRAMUSCULAR | Status: AC
  Filled 2022-11-26: qty 2

## 2022-11-26 MED ORDER — HEPARIN (PORCINE) IN NACL 1000-0.9 UT/500ML-% IV SOLN
INTRAVENOUS | Status: DC | PRN
  Administered 2022-11-26 (×2): 500 mL

## 2022-11-26 MED ORDER — DIPHENHYDRAMINE HCL 50 MG/ML IJ SOLN
INTRAMUSCULAR | Status: AC
  Filled 2022-11-26: qty 1

## 2022-11-26 MED ORDER — FENTANYL CITRATE PF 50 MCG/ML IJ SOSY
50.0000 ug | PREFILLED_SYRINGE | INTRAMUSCULAR | Status: DC | PRN
  Administered 2022-11-26 – 2022-11-30 (×8): 50 ug via INTRAVENOUS
  Filled 2022-11-26 (×10): qty 1

## 2022-11-26 MED ORDER — HEPARIN (PORCINE) 25000 UT/250ML-% IV SOLN
1600.0000 [IU]/h | INTRAVENOUS | Status: DC
  Administered 2022-11-26: 1500 [IU]/h via INTRAVENOUS
  Administered 2022-11-27: 1600 [IU]/h via INTRAVENOUS
  Filled 2022-11-26 (×4): qty 250

## 2022-11-26 MED ORDER — METHYLPREDNISOLONE SODIUM SUCC 125 MG IJ SOLR
125.0000 mg | Freq: Once | INTRAMUSCULAR | Status: AC
  Administered 2022-11-26: 125 mg via INTRAVENOUS
  Filled 2022-11-26: qty 2

## 2022-11-26 MED ORDER — SODIUM CHLORIDE 0.9% FLUSH
10.0000 mL | Freq: Two times a day (BID) | INTRAVENOUS | Status: DC
  Administered 2022-11-26 – 2022-11-29 (×3): 10 mL

## 2022-11-26 MED ORDER — METHYLPREDNISOLONE SODIUM SUCC 125 MG IJ SOLR
125.0000 mg | Freq: Once | INTRAMUSCULAR | Status: DC
Start: 2022-11-26 — End: 2022-11-26

## 2022-11-26 MED ORDER — FAMOTIDINE IN NACL 20-0.9 MG/50ML-% IV SOLN
INTRAVENOUS | Status: AC
  Filled 2022-11-26: qty 50

## 2022-11-26 MED ORDER — HYDROMORPHONE HCL 1 MG/ML IJ SOLN
0.5000 mg | INTRAMUSCULAR | Status: AC
  Administered 2022-11-27: 0.5 mg via INTRAVENOUS
  Filled 2022-11-26: qty 0.5

## 2022-11-26 SURGICAL SUPPLY — 7 items
CATH OMNI FLUSH 5F 65CM (CATHETERS) ×1 IMPLANT
COVER DOME SNAP 22 D (MISCELLANEOUS) ×4 IMPLANT
KIT MICROPUNCTURE NIT STIFF (SHEATH) ×1 IMPLANT
KIT SYRINGE INJ CVI SPIKEX1 (MISCELLANEOUS) ×1 IMPLANT
SHEATH PINNACLE 5F 10CM (SHEATH) ×1 IMPLANT
TRAY PV CATH (CUSTOM PROCEDURE TRAY) ×2 IMPLANT
WIRE BENTSON .035X145CM (WIRE) ×1 IMPLANT

## 2022-11-26 NOTE — Progress Notes (Signed)
  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 Myra Gianotti Has contrast Allergy so she will need CO2 Answered her questions regarding her procedure today  Keep NPO Heparin held as of 4 am this morning Consent already ordered   Graceann Congress, PA-C [facility] [phone] 11/26/2022 7:12 AM

## 2022-11-26 NOTE — Progress Notes (Signed)
PT Cancellation Note  Patient Details Name: ADAIRA WESTERVELT MRN: [id number] DOB: Aug 16, 1965   Cancelled Treatment:    Reason Eval/Treat Not Completed: Pain limiting ability to participate  Attempted to co-eval with OT due to pt's level of pain and poor activity tolerance. Patient with very limited participation with OT and refused all PT assessments due to abd pain. Educated on role of PT and goal to be able to return home from hospital, however she will likely need to consider SNF if slow progress. RN made aware of pt request for pain medication (which unfortunately is not yet due).    Jerolyn Center, PT Acute Rehabilitation Services  Office [phone]   Zena Amos 11/26/2022, 11:47 AM

## 2022-11-26 NOTE — Progress Notes (Signed)
Patient to cath lab procedure area this am. Patient crying and begging for pain meds. PO pain med given.

## 2022-11-26 NOTE — Progress Notes (Signed)
Patient remains off the floor in hemo

## 2022-11-26 NOTE — Progress Notes (Signed)
ANTICOAGULATION CONSULT NOTE - Follow Up Consult  Pharmacy Consult for Heparin Indication: atrial fibrillation (apixaban on hold)  Allergies  Allergen Reactions   Diazepam [name]   Gabapentin [name] and Swelling    Other reaction(s): Unknown   Iodinated Contrast Media Anaphylaxis and [name]    11/29/17 Cardiac arrest 1 min after IV contrast, possible allergy vs vasovagal episode  Iopamidol   Anaphylaxis   High 11/28/2017   Patient had seizure like activity and then code post 100 cc of isovue 300   Iopamidol Anaphylaxis    11/28/17 Patient had seizure like activity and then 1 min code after 100 cc of isovue 300. Possible contrast allergy vs vasovagal episode  Other Reaction(s): Cardiac Arrest   Lisinopril Anaphylaxis    Tongue and mouth swelling  Other Reaction(s): Laryngeal Edema   Metoclopramide Other (See Comments)    Tardive dyskinesia  Also known as Reglan  Other Reaction(s): Unknown   Nsaids Anaphylaxis and Other (See Comments)    ULCER  Other Reaction(s): Unknown   Penicillins Palpitations and Itching    Has patient had a PCN reaction causing immediate rash, facial/tongue/throat swelling, SOB or lightheadedness with hypotension: Yes, heart races  Has patient had a PCN reaction causing severe rash involving mucus membranes or skin necrosis: No  Has patient had a PCN reaction that required hospitalization: Yes   Has patient had a PCN reaction occurring within the last 10 years: No  Other Reaction(s): Flushing (Red Skin), Laryngeal Edema   Chlorhexidine Dermatitis   Tolmetin Nausea Only, Other (See Comments) and Nausea And Vomiting    ULCER  Other Reaction(s): Unknown   Aspartame And Phenylalanine Hives   Aspirin Other (See Comments)    Irritates stomach ulcer    Dicyclomine Other (See Comments)    Chest pain  Other Reaction(s): Unknown   Rifamycin     Other Reaction(s): palpitations   Acetaminophen Nausea Only,  Other (See Comments) and Nausea And Vomiting    Irritates stomach ulcer; Abdominal pain  Other Reaction(s): Abdominal Pain, ulcer   Cyclobenzaprine Palpitations    Other Reaction(s): palpitations   Oxycodone Palpitations    Other Reaction(s): palpitations   Rifamycins Palpitations   Tramadol Nausea And Vomiting    Other Reaction(s): palpitations    Patient Measurements: Weight: 131.8 kg (290 lb 9.1 oz) Heparin Dosing Weight: 94 kg  Vital Signs: Temp: 98.4 F (36.9 C) (09/20 1440) Temp Source: Oral (09/20 1131) BP: 117/67 (09/20 1730) Pulse Rate: 90 (09/20 1730)  Labs: Recent Labs    11/24/22 0513 11/24/22 2250 11/25/22 0609 11/26/22 0700  HGB 6.9* 7.6* 7.6* 7.8*  HCT 23.4* 25.4* 26.8* 27.0*  PLT 340  --  429* 488*  APTT  --   --  42* 47*  HEPARINUNFRC  --   --  >1.10* 0.64  CREATININE 5.01*  --  3.54* 4.06*    Estimated Creatinine Clearance: 21.1 mL/min (A) (by C-G formula based on SCr of 4.06 mg/dL (H)).  Assessment: 57 year old female to begin heparin while apixaban is on hold in anticipation of vascular procedure on Friday.  Last dose of apixaban this AM at 8 am.  Heparin drip was stopped in anticipation of procedure today however this has been rescheduled for Mon. New IV line placed. Ok to resume IV heparin per Dr Jerral Ralph. Hgb low stable 7s, platelets are elevated. No bleeding noted.   Goal of Therapy:  Heparin level 0.3-0.7 units/ml aPTT 66-102 seconds Monitor platelets by  anticoagulation protocol: Yes   Plan:  Resume heparin drip at 1500 units/hr aPTT in 8 hours Daily heparin level, aPTT, CBC Monitor for s/sx of bleeding  Thank you for involving pharmacy in this patient's care.  Loura Back, PharmD, BCPS Clinical Pharmacist Clinical phone for 11/26/2022 is [phone] 11/26/2022 6:08 PM

## 2022-11-26 NOTE — Progress Notes (Signed)
[facility] Progress Note   Subjective: Seen in room, crying in pain begging for IV pain meds. Apparently patient went down for aortagram this AM but procedure delayed D/T lack of working IV. Spoke with RN, she has consulted IV team for IV access. HD later today. Pt received PO Pain meds at 0806 per notes.   Objective Vitals:   11/25/22 2300 11/26/22 0000 11/26/22 0100 11/26/22 0554  BP:   111/61 (!) 141/65  Pulse:      Resp: 18 20  14   Temp:   97.9 F (36.6 C) 97.8 F (36.6 C)  TempSrc:   Oral Oral  SpO2:      Weight:       Physical Exam General: Chronically ill appearing obese female crying out in pain Heart: S1, S2 RRR No M/R/G Lungs: CTAB A/P Abdomen: NABS NT Extremities: Trace BLE edema. Drsgs on feet Dialysis Access: R TDC drsg intact   Additional Objective Labs: Basic Metabolic Panel: Recent Labs  Lab 11/23/22 1545 11/24/22 0513 11/25/22 0609 11/26/22 0700  NA  --  131* 131* 132*  K  --  4.1 3.1* 3.0*  CL  --  95* 95* 97*  CO2  --  25 25 23   GLUCOSE  --  87 76 74  BUN  --  29* 18 22*  CREATININE  --  5.01* 3.54* 4.06*  CALCIUM  --  7.9* 7.6* 7.7*  PHOS 8.4*  --  3.4  --    Liver Function Tests: Recent Labs  Lab 11/23/22 0023 11/24/22 0513 11/25/22 0609  AST 47* 46* 41  ALT 15 15 16   ALKPHOS 86 54 59  BILITOT 1.3* 1.3* 1.1  PROT 7.8 6.1* 6.6  ALBUMIN 2.2* 1.8* 1.9*  1.9*   No results for input(s): "LIPASE", "AMYLASE" in the last 168 hours. CBC: Recent Labs  Lab 11/23/22 0023 11/23/22 0218 11/24/22 0513 11/24/22 2250 11/25/22 0609 11/26/22 0700  WBC 30.3*  --  19.8*  --  19.1* 17.1*  NEUTROABS 27.1*  --  17.0*  --  16.4*  --   HGB 7.7*   < > 6.9* 7.6* 7.6* 7.8*  HCT 26.6*   < > 23.4* 25.4* 26.8* 27.0*  MCV 91.7  --  90.7  --  90.8 93.8  PLT 345  --  340  --  429* 488*   < > = values in this interval not displayed.   Blood Culture    Component Value Date/Time   SDES BLOOD SITE NOT SPECIFIED 11/23/2022 0023    SPECREQUEST  11/23/2022 0023    BOTTLES DRAWN AEROBIC AND ANAEROBIC Blood Culture adequate volume   CULT  11/23/2022 0023    NO GROWTH 2 DAYS Performed at [facility], [address], [place], [place] [address]    REPTSTATUS PENDING 11/23/2022 0023    Cardiac Enzymes: No results for input(s): "CKTOTAL", "CKMB", "CKMBINDEX", "TROPONINI" in the last 168 hours. CBG: Recent Labs  Lab 11/25/22 1218 11/25/22 1638 11/25/22 2014 11/25/22 2300 11/25/22 2339  GLUCAP 92 77 65* 69* 74   Iron Studies: No results for input(s): "IRON", "TIBC", "TRANSFERRIN", "FERRITIN" in the last 72 hours. @lablastinr3 @ Studies/Results: CT CHEST ABDOMEN PELVIS WO CONTRAST  Result Date: 11/25/2022 CLINICAL DATA:  Sepsis EXAM: CT CHEST, ABDOMEN AND PELVIS WITHOUT CONTRAST TECHNIQUE: Multidetector CT imaging of the chest, abdomen and pelvis was performed following the standard protocol without IV contrast. RADIATION DOSE REDUCTION: This exam was performed according to the departmental dose-optimization program which includes  automated exposure control, adjustment of the mA and/or kV according to patient size and/or use of iterative reconstruction technique. COMPARISON:  Abdominal ultrasound 11/23/2022. FINDINGS: CT CHEST FINDINGS Cardiovascular: No significant vascular findings. Normal heart size. No pericardial effusion. Mediastinum/Nodes: No enlarged mediastinal, hilar, or axillary lymph nodes. Thyroid gland, trachea, and esophagus demonstrate no significant findings. Lungs/Pleura: Lungs are clear. No pleural effusion or pneumothorax. Musculoskeletal: No chest wall mass or suspicious bone lesions identified. CT ABDOMEN PELVIS FINDINGS Hepatobiliary: No focal liver abnormality is seen. Status post cholecystectomy. No biliary dilatation. Pancreas: Unremarkable. No pancreatic ductal dilatation or surrounding inflammatory changes. Spleen: Normal in size without focal abnormality. Adrenals/Urinary Tract: Adrenal glands  are unremarkable. Kidneys are normal, without renal calculi, focal lesion, or hydronephrosis. Bladder is unremarkable. Stomach/Bowel: Stomach is within normal limits. Appendix is not confidently identified but there is new inflammatory changes in the right lower quadrant to suggest appendicitis. No evidence of bowel wall thickening, distention, or inflammatory changes. Vascular/Lymphatic: Mild aortic atherosclerosis. No enlarged abdominal or pelvic lymph nodes. Reproductive: Fundal fibroid.  No adnexal mass identified Other: No abdominal wall hernia or abnormality. No abdominopelvic ascites. Musculoskeletal: No acute or significant osseous findings. Lower lumbar degenerative change. IMPRESSION: No evidence of acute abnormality in the chest, abdomen or pelvis on this noncontrast CT. Electronically Signed   By: Feliberto Harts M.D.   On: 11/25/2022 13:20   VAS [place] ABI WITH/WO TBI  Result Date: 11/24/2022  LOWER EXTREMITY DOPPLER STUDY Patient Name:  CHRISTY GOICOECHEA  Date of Exam:   11/24/2022 Medical Rec #: [id number]             Accession #:    [id number] Date of Birth: 1966-03-03             Patient Gender: F Patient Age:   57 years Exam Location:  [name] Procedure:      VAS [place] ABI WITH/WO TBI Referring Phys: Rhetta Mura --------------------------------------------------------------------------------  Indications: Ulceration. High Risk Factors: None, hypertension, no history of smoking. Other Factors: ESRD (HD), CHF, Afib,.  Limitations: Today's exam was limited due to patient positioning and patient              intolerant to cuff pressure. Comparison Study: No previous exams Performing Technologist: Hill, Jody RVT, RDMS  Examination Guidelines: A complete evaluation includes at minimum, Doppler waveform signals and systolic blood pressure reading at the level of bilateral brachial, anterior tibial, and posterior tibial arteries, when vessel segments are accessible. Bilateral testing is  considered an integral part of a complete examination. Photoelectric Plethysmograph (PPG) waveforms and toe systolic pressure readings are included as required and additional duplex testing as needed. Limited examinations for reoccurring indications may be performed as noted.  ABI Findings: +---------+------------------+-----+----------+--------+ Right    Rt Pressure (mmHg)IndexWaveform  Comment  +---------+------------------+-----+----------+--------+ Brachial 113                    triphasic          +---------+------------------+-----+----------+--------+ DP       93                0.82 monophasic         +---------+------------------+-----+----------+--------+ Great Toe64                0.57 Abnormal           +---------+------------------+-----+----------+--------+ +---------+------------------+-----+----------+---------+ Left     Lt Pressure (mmHg)IndexWaveform  Comment   +---------+------------------+-----+----------+---------+ Brachial  HD access +---------+------------------+-----+----------+---------+ PTA                             biphasic            +---------+------------------+-----+----------+---------+ DP       91                0.81 monophasic          +---------+------------------+-----+----------+---------+ Great Toe57                0.50 Abnormal            +---------+------------------+-----+----------+---------+ Unable to obtain RLE PTA waveform/pressure due to patient position - unable to move due to pain. Unable to obtain LLE PTA pressure due to pain intolerance.  Summary: Right: Resting right ankle-brachial index indicates mild right lower extremity arterial disease. The right toe-brachial index is abnormal. Left: Resting left ankle-brachial index indicates mild left lower extremity arterial disease. The left toe-brachial index is abnormal. *See table(s) above for measurements and observations.   Electronically signed by Coral Else MD on 11/24/2022 at 6:45:56 PM.    Final    Medications:  ceFEPime (MAXIPIME) IV 1 g (11/26/22 0200)   famotidine     vancomycin Stopped (11/24/22 2115)    doxercalciferol  4 mcg Intravenous Q M,W,F-HD   DULoxetine  40 mg Oral Daily   ferric citrate  420 mg Oral TID WC   insulin aspart  0-15 Units Subcutaneous TID WC   insulin glargine-yfgn  30 Units Subcutaneous Daily   leptospermum manuka honey  1 Application Topical Daily   metroNIDAZOLE  500 mg Oral Q8H   midodrine  5 mg Oral TID WC   mirtazapine  15 mg Oral QHS   pantoprazole  40 mg Oral Daily   sodium chloride flush  3 mL Intravenous Q12H      06/30/2022 -- > Dr Chestine Spore of [facility] did L BC AVF placement     OP HD: [facility] MWF  4h  400/800    126.9kg  RIJ TDC/  LUA AVF (maturing)  - Heparin 5000 units IV initial bolus.Heparin 2500 units IV midrun - hectorol 7 mcg IV tiw - venofer 50mg  IV weekly (Hold while getting ABX) - mircera 225 mcg IV q 2 wks     Assessment/ Plan: Sepsis/ SSTI - initially concern for R ankle osteomyelitis.  Responding to IV abx and IV fluids. Blood cx neg to date.  PAD/R ankle ulcer - [facility] following. For arteriogram Fri. H/o metastatic carcinoid tumor ESRD - on HD MWF.Back on schedule. Next HD 9/20 H/o HTN - BP's soft, hold all home HTN meds Volume - BP ok. S/p fluid bolus. Volume up on exam, will plan UF 2-2.5 L as tolerated.  Anemia esrd - Hb 7.8 s/p 1 Unit PRBCs 9/18. Next  esa is due on 9/27. Follow.  MBD ckd - CCa in range. Phos above goal. Cont Auryxia binder Hyponatremia - due to vol overload, see above H/o afib H/o chronic pain Deconditioning - pt is WC to chair/ bed dependent    Shakina Choy H. Michaella Imai NP-C 11/26/2022, 9:12 AM  [employer] [phone]

## 2022-11-26 NOTE — Evaluation (Signed)
Occupational Therapy Evaluation Patient Details Name: Deborah [name] MRN: [id number] DOB: 04/28/1965 Today's Date: 11/26/2022   History of Present Illness 57 y.o. female who presented to the ED 11/23/22 with 1 week progressive fatigue. +sepsis, hypovolemic shock, +wounds (sacrum, L ischium, rt heel). MRI negative for osteomyelitis, PMH- ESRD on Monday Wednesday Friday dialysis, malignant carcinoid, A-fib, HFpEF, hypertension, diabetes,   Clinical Impression   Pt very limited by her stomach pains, refused repositioning and remained curled in a ball throughout session needing constant redirection to engage with therapists. Minimal participation in exercises as well, discussed with pt the importance of mobility and exercises to preserve strength. Also educated pt on pressure relief. OT to continue to follow pt acutely to address deficits and help transition to next level of care. Patient would benefit from post acute skilled rehab facility with <3 hours of therapy and 24/7 support        If plan is discharge home, recommend the following: A lot of help with bathing/dressing/bathroom;Assistance with cooking/housework;A lot of help with walking and/or transfers    Functional Status Assessment  Patient has had a recent decline in their functional status and demonstrates the ability to make significant improvements in function in a reasonable and predictable amount of time.  Equipment Recommendations  None recommended by OT (TBD at next level of care)    Recommendations for Other Services       Precautions / Restrictions Precautions Precaution Comments: Sacral wounds, Rt heel wound Restrictions Weight Bearing Restrictions: Yes RLE Weight Bearing: Non weight bearing Other Position/Activity Restrictions: Per Dr Lajoyce Corners, ideally will be NWB on rt heel, but put as little weight as possible on rt foot      Mobility Bed Mobility               General bed mobility comments: Declined     Transfers                          Balance Overall balance assessment: No apparent balance deficits (not formally assessed)                                         ADL either performed or assessed with clinical judgement   ADL Overall ADL's : Needs assistance/impaired                                       General ADL Comments: Very limited ADL assessment, pt declining ADLs, more focused on her pain and needs cueing to keep engaged. Pt originally without clothing upon entry, was draped with gown by female OT but she declined wanting to put on a gown. Educated pt on the importance of rolling every hour for pressure relief and attempted LB exercises     Vision         Perception         Praxis         Pertinent Vitals/Pain Pain Assessment Pain Assessment: PAINAD Breathing: occasional labored breathing, short period of hyperventilation Negative Vocalization: repeated troubled calling out, loud moaning/groaning, crying Facial Expression: facial grimacing Body Language: tense, distressed pacing, fidgeting Consolability: distracted or reassured by voice/touch PAINAD Score: 7 Pain Location: stomach Pain Descriptors / Indicators: Guarding, Grimacing, Aching, Moaning Pain Intervention(s): Limited activity within patient's  tolerance, Patient requesting pain meds-RN notified, Other (comment) (Pt not able to receive any more pain meds due to using her allowed PO amount earlier in the day, MD did not prescribe more pain meds at the time of session)     Extremity/Trunk Assessment Upper Extremity Assessment Upper Extremity Assessment:  (Pt not attending to therapist much, focsued on stomach pain. arms clenched around stomach.)   Lower Extremity Assessment Lower Extremity Assessment: Defer to PT evaluation       Communication Communication Cueing Techniques: Verbal cues;Gestural cues   Cognition Arousal: Lethargic Behavior During  Therapy: WFL for tasks assessed/performed Overall Cognitive Status: Difficult to assess                                       General Comments  RN notified of need for pain meds, RN explained that he pt can't have anymore pain meds since she took her PO meds earlier and the MD has not place an order for more at the time. Offerred to reposition pt but she declined.    Exercises General Exercises - Lower Extremity Gluteal Sets: AROM, Both, Sidelying, 5 reps, Limitations Gluteal Sets Limitations: Pt seemed to participate quite sparingly, Hip ABduction/ADduction: AROM, Sidelying, Limitations, Both, 5 reps Hip Abduction/Adduction Limitations: Pt seemed to participate quite sparingly,   Shoulder Instructions      Home Living Family/patient expects to be discharged to:: Private residence Living Arrangements: Alone Available Help at Discharge: Personal care attendant;Available PRN/intermittently Type of Home: Apartment Home Access: Stairs to enter Entrance Stairs-Number of Steps: 1 Entrance Stairs-Rails: Can reach both Home Layout: One level     Bathroom Shower/Tub: Chief Strategy Officer: Standard     Home Equipment: BSC/3in1;Shower seat;Wheelchair - manual   Additional Comments: patient has PCA 6 days a week 3 hours a day.      Prior Functioning/Environment Prior Level of Function : Needs assist             Mobility Comments: pivoting to wheelchair; propelling w/c          OT Problem List: Pain      OT Treatment/Interventions: Self-care/ADL training;Balance training;Therapeutic exercise;Therapeutic activities;Patient/family education    OT Goals(Current goals can be found in the care plan section) Acute Rehab OT Goals Patient Stated Goal: none stated Time For Goal Achievement: 12/10/22 Potential to Achieve Goals: Fair  OT Frequency: Min 1X/week    Co-evaluation              AM-PAC OT "6 Clicks" Daily Activity     Outcome  Measure Help from another person eating meals?: A Lot Help from another person taking care of personal grooming?: A Lot Help from another person toileting, which includes using toliet, bedpan, or urinal?: A Lot Help from another person bathing (including washing, rinsing, drying)?: A Lot Help from another person to put on and taking off regular upper body clothing?: A Lot Help from another person to put on and taking off regular lower body clothing?: Total 6 Click Score: 11   End of Session Nurse Communication: Mobility status  Activity Tolerance: Patient limited by pain Patient left: in bed;with call bell/phone within reach  OT Visit Diagnosis: Pain Pain - part of body:  (stomach)                Time: 0454-0981 OT Time Calculation (min): 18 min Charges:  OT General Charges $  OT Visit: 1 Visit OT Evaluation $OT Eval Low Complexity: 1 Low  11/26/2022  AB, OTR/L  Acute Rehabilitation Services  Office: [phone]   Tristan Schroeder 11/26/2022, 12:09 PM

## 2022-11-26 NOTE — Progress Notes (Addendum)
PROGRESS NOTE        PATIENT DETAILS Name: Deborah [name] Age: 57 y.o. Sex: female Date of Birth: 12/07/1965 Admit Date: 11/22/2022 Admitting Physician Dolly Rias, MD PCP:Pcp, No  Brief Summary: Patient is a 57 y.o.  female with history of ESRD, PAF on Eliquis, HFpEF, HTN, DM-2, malignant carcinoid, NAFLD-who presented with weakness/fatigue several episodes of nausea/vomiting-found to have sepsis-likely due to soft tissue infection involving right ankle.  See below for further details.  Significant events: 9/16>> admit to [facility]  Significant studies: 9/17>> CXR: No PNA 9/17>> RUQ ultrasound: No acute abnormality 9/18>> MRI right foot: No acute osteomyelitis.  Significant microbiology data: 9/17>> COVID/influenza/RSV PCR: Negative 9/17>> blood culture: No growth  Procedures: None  Consults: Nephrology Orthopedics  Subjective: Sleeping comfortably when I walked in-started crying when she woke up-"because I feel like it".  Denies any abdominal pain.  Per night RN-mostly slept through the night without any issues.  Objective: Vitals: Blood pressure (!) 141/65, pulse 86, temperature 97.8 F (36.6 C), temperature source Oral, resp. rate 18, weight 133 kg, last menstrual period 10/10/2012, SpO2 100%.   Exam: Gen Exam:Alert awake-not in any distress HEENT:atraumatic, normocephalic Chest: B/L clear to auscultation anteriorly CVS:S1S2 regular Abdomen:soft non tender, non distended Extremities:no edema Neurology: Moving both extremities-at baseline.  See pictures below regarding her right heel wound. Skin: no rash        Pertinent Labs/Radiology:    Latest Ref Rng & Units 11/26/2022    7:00 AM 11/25/2022    6:09 AM 11/24/2022   10:50 PM  CBC  WBC 4.0 - 10.5 K/uL 17.1  19.1    Hemoglobin 12.0 - 15.0 g/dL 7.8  7.6  7.6   Hematocrit 36.0 - 46.0 % 27.0  26.8  25.4   Platelets 150 - 400 K/uL 488  429      Lab Results  Component Value  Date   NA 132 (L) 11/26/2022   K 3.0 (L) 11/26/2022   CL 97 (L) 11/26/2022   CO2 23 11/26/2022     Assessment/Plan: Sepsis Unclear etiology-initially thought to have osteomyelitis of right ankle but MRI is negative-wound is chronic and does not appear overtly infected. CT chest/abdomen/pelvis negative for any obvious infection All cultures negative so far Leukocytosis has trended down with just broad-spectrum antibiotics-no other focus of infection apparent.  Dialysis catheter site appears benign without any discharge. Continue empiric antibiotics-may just need to treat empirically x 1 week.  Chronic right heel ulcer No evidence of osteomyelitis on MRI Vascular surgery planning angiogram 9/20 Dr. Lajoyce Corners following.  ESRD on HD Nephrology following and directing care BP soft-adding midodrine  Normocytic anemia Multifactorial-chronic anemia due to ESRD/CKD-worsened by anemia due to acute illness No evidence of GI bleeding/blood loss CBC stable-s/p 1 unit of PRBC on 9/18. Follow CBC periodically.    Minimally elevated troponin Demand ischemia No chest pain/anginal symptoms Supportive care  Transaminitis Unclear etiology but probably due to sepsis physiology/underlying carcinoid syndrome Resolved  Hyponatremia Mild-asymptomatic Should improve with volume removal with HD  Chronic HFpEF Some leg edema but otherwise stable Volume removal with HD  PAF Telemetry monitoring Eliquis held in anticipation of angiogram-on IV heparin  HTN All antihypertensives held-as blood pressure remains soft due to sepsis physiology  History of duodenal carcinoma/carcinoid-s/p resection 2021 Chronic pain syndrome Narcotic dependence Supportive care Per nursing staff-has been repeatedly asking  for IV Dilaudid-exhibiting some pain seeking behavior.  Continue narcotics at home dose.  DM-2 (A1c 8.19/13) CBG relatively stable-currently n.p.o. Continue Semglee 30 units daily with  SSI Follow/adjust.  Recent Labs    11/25/22 2339 11/26/22 1002 11/26/22 1128  GLUCAP 74 99 121*     Mood disorder Relatively stable Cymbalta/Remeron  Debility/deconditioning Mostly wheelchair-bound-able to take a few steps to go to the bathroom etc. PT/OT  Morbid Obesity: Estimated body mass index is 47.33 kg/m as calculated from the following:   Height as of 11/18/22: 5\' 6"  (1.676 m).   Weight as of this encounter: 133 kg.   Pressure ulcer Agree with assessment and plan as outlined below.  Pressure Injury 11/23/22 Ischial tuberosity Left Unstageable - Full thickness tissue loss in which the base of the injury is covered by slough (yellow, tan, gray, green or brown) and/or eschar (tan, brown or black) in the wound bed. (Active)  11/23/22 0853  Location: Ischial tuberosity  Location Orientation: Left  Staging: Unstageable - Full thickness tissue loss in which the base of the injury is covered by slough (yellow, tan, gray, green or brown) and/or eschar (tan, brown or black) in the wound bed.  Wound Description (Comments):   Present on Admission: Yes     Pressure Injury 11/23/22 Heel Right Unstageable - Full thickness tissue loss in which the base of the injury is covered by slough (yellow, tan, gray, green or brown) and/or eschar (tan, brown or black) in the wound bed. (Active)  11/23/22 0854  Location: Heel  Location Orientation: Right  Staging: Unstageable - Full thickness tissue loss in which the base of the injury is covered by slough (yellow, tan, gray, green or brown) and/or eschar (tan, brown or black) in the wound bed.  Wound Description (Comments):   Present on Admission: Yes     Pressure Injury 11/24/22 Thigh Proximal;Right;Medial;Upper Stage 2 -  Partial thickness loss of dermis presenting as a shallow open injury with a red, pink wound bed without slough. stage two injury to upper thigh into groin region. (Active)  11/24/22 1452  Location: Thigh  Location  Orientation: Proximal;Right;Medial;Upper  Staging: Stage 2 -  Partial thickness loss of dermis presenting as a shallow open injury with a red, pink wound bed without slough.  Wound Description (Comments): stage two injury to upper thigh into groin region.  Present on Admission: Yes      Code status:   Code Status: Full Code   DVT Prophylaxis: IV heparin    Family Communication: None at bedside   Disposition Plan: Status is: Inpatient Remains inpatient appropriate because: Severity of illness   Planned Discharge Destination:Home   Diet: Diet Order             Diet NPO time specified  Diet effective midnight                     Antimicrobial agents: Anti-infectives (From admission, onward)    Start     Dose/Rate Route Frequency Ordered Stop   11/24/22 1200  vancomycin (VANCOCIN) IVPB 1000 mg/200 mL premix        1,000 mg 200 mL/hr over 60 Minutes Intravenous Every M-W-F (Hemodialysis) 11/24/22 0826     11/24/22 0100  ceFEPIme (MAXIPIME) 1 g in sodium chloride 0.9 % 100 mL IVPB        1 g 200 mL/hr over 30 Minutes Intravenous Every 24 hours 11/23/22 0332     11/23/22 0900  metroNIDAZOLE (FLAGYL) tablet  500 mg        500 mg Oral Every 8 hours 11/23/22 0332     11/23/22 0343  vancomycin variable dose per unstable renal function (pharmacist dosing)  Status:  Discontinued         Does not apply See admin instructions 11/23/22 0343 11/25/22 0852   11/23/22 0100  aztreonam (AZACTAM) 2 g in sodium chloride 0.9 % 100 mL IVPB  Status:  Discontinued        2 g 200 mL/hr over 30 Minutes Intravenous  Once 11/23/22 0045 11/23/22 0051   11/23/22 0100  metroNIDAZOLE (FLAGYL) IVPB 500 mg        500 mg 100 mL/hr over 60 Minutes Intravenous  Once 11/23/22 0045 11/23/22 0231   11/23/22 0100  vancomycin (VANCOCIN) IVPB 1000 mg/200 mL premix  Status:  Discontinued        1,000 mg 200 mL/hr over 60 Minutes Intravenous  Once 11/23/22 0045 11/23/22 0051   11/23/22 0100  vancomycin  (VANCOCIN) 2,500 mg in sodium chloride 0.9 % 500 mL IVPB        2,500 mg 262.5 mL/hr over 120 Minutes Intravenous  Once 11/23/22 0051 11/23/22 0506   11/23/22 0100  ceFEPIme (MAXIPIME) 2 g in sodium chloride 0.9 % 100 mL IVPB        2 g 200 mL/hr over 30 Minutes Intravenous STAT 11/23/22 0051 11/23/22 0204        MEDICATIONS: Scheduled Meds:  doxercalciferol  4 mcg Intravenous Q M,W,F-HD   DULoxetine  40 mg Oral Daily   ferric citrate  420 mg Oral TID WC   insulin aspart  0-15 Units Subcutaneous TID WC   insulin glargine-yfgn  30 Units Subcutaneous Daily   leptospermum manuka honey  1 Application Topical Daily   metroNIDAZOLE  500 mg Oral Q8H   midodrine  5 mg Oral TID WC   mirtazapine  15 mg Oral QHS   pantoprazole  40 mg Oral Daily   sodium chloride flush  10-40 mL Intracatheter Q12H   sodium chloride flush  3 mL Intravenous Q12H   Continuous Infusions:  ceFEPime (MAXIPIME) IV 1 g (11/26/22 0200)   famotidine     vancomycin Stopped (11/24/22 2115)   PRN Meds:.albuterol, famotidine, fentaNYL (SUBLIMAZE) injection, HYDROmorphone **OR** HYDROmorphone, ondansetron (ZOFRAN) IV, sodium chloride flush   I have personally reviewed following labs and imaging studies  LABORATORY DATA: CBC: Recent Labs  Lab 11/23/22 0023 11/23/22 0218 11/24/22 0513 11/24/22 2250 11/25/22 0609 11/26/22 0700  WBC 30.3*  --  19.8*  --  19.1* 17.1*  NEUTROABS 27.1*  --  17.0*  --  16.4*  --   HGB 7.7* 7.8* 6.9* 7.6* 7.6* 7.8*  HCT 26.6* 23.0* 23.4* 25.4* 26.8* 27.0*  MCV 91.7  --  90.7  --  90.8 93.8  PLT 345  --  340  --  429* 488*    Basic Metabolic Panel: Recent Labs  Lab 11/23/22 0023 11/23/22 0218 11/23/22 1545 11/24/22 0513 11/25/22 0609 11/26/22 0700  NA 127* 127*  --  131* 131* 132*  K 4.0 3.7  --  4.1 3.1* 3.0*  CL 86*  --   --  95* 95* 97*  CO2 16*  --   --  25 25 23   GLUCOSE 270*  --   --  87 76 74  BUN 68*  --   --  29* 18 22*  CREATININE 10.14*  --   --  5.01*  3.54* 4.06*  CALCIUM 9.3  --   --  7.9* 7.6* 7.7*  PHOS  --   --  8.4*  --  3.4  --     GFR: Estimated Creatinine Clearance: 21.2 mL/min (A) (by C-G formula based on SCr of 4.06 mg/dL (H)).  Liver Function Tests: Recent Labs  Lab 11/23/22 0023 11/24/22 0513 11/25/22 0609  AST 47* 46* 41  ALT 15 15 16   ALKPHOS 86 54 59  BILITOT 1.3* 1.3* 1.1  PROT 7.8 6.1* 6.6  ALBUMIN 2.2* 1.8* 1.9*  1.9*   No results for input(s): "LIPASE", "AMYLASE" in the last 168 hours. No results for input(s): "AMMONIA" in the last 168 hours.  Coagulation Profile: Recent Labs  Lab 11/23/22 0023  INR 1.8*    Cardiac Enzymes: No results for input(s): "CKTOTAL", "CKMB", "CKMBINDEX", "TROPONINI" in the last 168 hours.  BNP (last 3 results) No results for input(s): "PROBNP" in the last 8760 hours.  Lipid Profile: No results for input(s): "CHOL", "HDL", "LDLCALC", "TRIG", "CHOLHDL", "LDLDIRECT" in the last 72 hours.  Thyroid Function Tests: No results for input(s): "TSH", "T4TOTAL", "FREET4", "T3FREE", "THYROIDAB" in the last 72 hours.  Anemia Panel: No results for input(s): "VITAMINB12", "FOLATE", "FERRITIN", "TIBC", "IRON", "RETICCTPCT" in the last 72 hours.  Urine analysis:    Component Value Date/Time   COLORURINE STRAW (A) 06/19/2022 0638   APPEARANCEUR HAZY (A) 06/19/2022 0638   LABSPEC 1.010 06/19/2022 0638   PHURINE 5.0 06/19/2022 0638   GLUCOSEU NEGATIVE 06/19/2022 0638   HGBUR SMALL (A) 06/19/2022 0638   BILIRUBINUR NEGATIVE 06/19/2022 0638   KETONESUR NEGATIVE 06/19/2022 0638   PROTEINUR 30 (A) 06/19/2022 0638   UROBILINOGEN 0.2 10/02/2013 2108   NITRITE NEGATIVE 06/19/2022 0638   LEUKOCYTESUR LARGE (A) 06/19/2022 0638    Sepsis Labs: Lactic Acid, Venous    Component Value Date/Time   LATICACIDVEN 1.0 11/23/2022 0914    MICROBIOLOGY: Recent Results (from the past 240 hour(s))  Resp panel by RT-PCR (RSV, Flu A&B, Covid) Anterior Nasal Swab     Status: None    Collection Time: 11/23/22 12:23 AM   Specimen: Anterior Nasal Swab  Result Value Ref Range Status   SARS Coronavirus 2 by RT PCR NEGATIVE NEGATIVE Final   Influenza A by PCR NEGATIVE NEGATIVE Final   Influenza B by PCR NEGATIVE NEGATIVE Final    Comment: (NOTE) The Xpert Xpress SARS-CoV-2/FLU/RSV plus assay is intended as an aid in the diagnosis of influenza from Nasopharyngeal swab specimens and should not be used as a sole basis for treatment. Nasal washings and aspirates are unacceptable for Xpert Xpress SARS-CoV-2/FLU/RSV testing.  Fact Sheet for Patients: BloggerCourse.com  Fact Sheet for Healthcare Providers: SeriousBroker.it  This test is not yet approved or cleared by the [place] FDA and has been authorized for detection and/or diagnosis of SARS-CoV-2 by FDA under an Emergency Use Authorization (EUA). This EUA will remain in effect (meaning this test can be used) for the duration of the COVID-19 declaration under Section 564(b)(1) of the Act, 21 U.S.C. section 360bbb-3(b)(1), unless the authorization is terminated or revoked.     Resp Syncytial Virus by PCR NEGATIVE NEGATIVE Final    Comment: (NOTE) Fact Sheet for Patients: BloggerCourse.com  Fact Sheet for Healthcare Providers: SeriousBroker.it  This test is not yet approved or cleared by the [place] FDA and has been authorized for detection and/or diagnosis of SARS-CoV-2 by FDA under an Emergency Use Authorization (EUA). This EUA will remain in effect (meaning this test can be used)  for the duration of the COVID-19 declaration under Section 564(b)(1) of the Act, 21 U.S.C. section 360bbb-3(b)(1), unless the authorization is terminated or revoked.  Performed at [facility], [address] [place], [place] [address]   Blood Culture (routine x 2)     Status: None (Preliminary result)   Collection  Time: 11/23/22 12:23 AM   Specimen: BLOOD  Result Value Ref Range Status   Specimen Description BLOOD SITE NOT SPECIFIED  Final   Special Requests   Final    BOTTLES DRAWN AEROBIC AND ANAEROBIC Blood Culture adequate volume   Culture   Final    NO GROWTH 3 DAYS Performed at [facility], [address], [place], [place] [address]    Report Status PENDING  Incomplete    RADIOLOGY STUDIES/RESULTS: CT CHEST ABDOMEN PELVIS WO CONTRAST  Result Date: 11/25/2022 CLINICAL DATA:  Sepsis EXAM: CT CHEST, ABDOMEN AND PELVIS WITHOUT CONTRAST TECHNIQUE: Multidetector CT imaging of the chest, abdomen and pelvis was performed following the standard protocol without IV contrast. RADIATION DOSE REDUCTION: This exam was performed according to the departmental dose-optimization program which includes automated exposure control, adjustment of the mA and/or kV according to patient size and/or use of iterative reconstruction technique. COMPARISON:  Abdominal ultrasound 11/23/2022. FINDINGS: CT CHEST FINDINGS Cardiovascular: No significant vascular findings. Normal heart size. No pericardial effusion. Mediastinum/Nodes: No enlarged mediastinal, hilar, or axillary lymph nodes. Thyroid gland, trachea, and esophagus demonstrate no significant findings. Lungs/Pleura: Lungs are clear. No pleural effusion or pneumothorax. Musculoskeletal: No chest wall mass or suspicious bone lesions identified. CT ABDOMEN PELVIS FINDINGS Hepatobiliary: No focal liver abnormality is seen. Status post cholecystectomy. No biliary dilatation. Pancreas: Unremarkable. No pancreatic ductal dilatation or surrounding inflammatory changes. Spleen: Normal in size without focal abnormality. Adrenals/Urinary Tract: Adrenal glands are unremarkable. Kidneys are normal, without renal calculi, focal lesion, or hydronephrosis. Bladder is unremarkable. Stomach/Bowel: Stomach is within normal limits. Appendix is not confidently identified but there is new  inflammatory changes in the right lower quadrant to suggest appendicitis. No evidence of bowel wall thickening, distention, or inflammatory changes. Vascular/Lymphatic: Mild aortic atherosclerosis. No enlarged abdominal or pelvic lymph nodes. Reproductive: Fundal fibroid.  No adnexal mass identified Other: No abdominal wall hernia or abnormality. No abdominopelvic ascites. Musculoskeletal: No acute or significant osseous findings. Lower lumbar degenerative change. IMPRESSION: No evidence of acute abnormality in the chest, abdomen or pelvis on this noncontrast CT. Electronically Signed   By: Feliberto Harts M.D.   On: 11/25/2022 13:20   VAS [place] ABI WITH/WO TBI  Result Date: 11/24/2022  LOWER EXTREMITY DOPPLER STUDY Patient Name:  MAZE SKLUZACEK  Date of Exam:   11/24/2022 Medical Rec #: [id number]             Accession #:    [id number] Date of Birth: February 09, 1966             Patient Gender: F Patient Age:   24 years Exam Location:  [facility] Procedure:      VAS [place] ABI WITH/WO TBI Referring Phys: Rhetta Mura --------------------------------------------------------------------------------  Indications: Ulceration. High Risk Factors: None, hypertension, no history of smoking. Other Factors: ESRD (HD), CHF, Afib,.  Limitations: Today's exam was limited due to patient positioning and patient              intolerant to cuff pressure. Comparison Study: No previous exams Performing Technologist: Hill, Jody RVT, RDMS  Examination Guidelines: A complete evaluation includes at minimum, Doppler waveform signals and systolic  blood pressure reading at the level of bilateral brachial, anterior tibial, and posterior tibial arteries, when vessel segments are accessible. Bilateral testing is considered an integral part of a complete examination. Photoelectric Plethysmograph (PPG) waveforms and toe systolic pressure readings are included as required and additional duplex testing as needed. Limited examinations  for reoccurring indications may be performed as noted.  ABI Findings: +---------+------------------+-----+----------+--------+ Right    Rt Pressure (mmHg)IndexWaveform  Comment  +---------+------------------+-----+----------+--------+ Brachial 113                    triphasic          +---------+------------------+-----+----------+--------+ DP       93                0.82 monophasic         +---------+------------------+-----+----------+--------+ Great Toe64                0.57 Abnormal           +---------+------------------+-----+----------+--------+ +---------+------------------+-----+----------+---------+ Left     Lt Pressure (mmHg)IndexWaveform  Comment   +---------+------------------+-----+----------+---------+ Brachial                                  HD access +---------+------------------+-----+----------+---------+ PTA                             biphasic            +---------+------------------+-----+----------+---------+ DP       91                0.81 monophasic          +---------+------------------+-----+----------+---------+ Great Toe57                0.50 Abnormal            +---------+------------------+-----+----------+---------+ Unable to obtain RLE PTA waveform/pressure due to patient position - unable to move due to pain. Unable to obtain LLE PTA pressure due to pain intolerance.  Summary: Right: Resting right ankle-brachial index indicates mild right lower extremity arterial disease. The right toe-brachial index is abnormal. Left: Resting left ankle-brachial index indicates mild left lower extremity arterial disease. The left toe-brachial index is abnormal. *See table(s) above for measurements and observations.  Electronically signed by Coral Else MD on 11/24/2022 at 6:45:56 PM.    Final      LOS: 3 days   Jeoffrey Massed, MD  [facility]    To contact the attending provider between 7A-7P or the covering provider during  after hours 7P-7A, please log into the web site [email or web address] and access using universal [place] password for that web site. If you do not have the password, please call the hospital operator.  11/26/2022, 11:36 AM

## 2022-11-26 NOTE — Care Management Important Message (Signed)
Important Message  Patient Details  Name: Deborah [name] MRN: [id number] Date of Birth: 1965/05/21   Medicare Important Message Given:  Yes     [name] 11/26/2022, 3:03 PM

## 2022-11-26 NOTE — Interval H&P Note (Signed)
History and Physical Interval Note:  11/26/2022 8:18 AM  Deborah [name]  has presented today for surgery, with the diagnosis of pad w/ ulceration of right heal.  The various methods of treatment have been discussed with the patient and family. After consideration of risks, benefits and other options for treatment, the patient has consented to  Procedure(s): ABDOMINAL AORTOGRAM W/LOWER EXTREMITY (N/A) as a surgical intervention.  The patient's history has been reviewed, patient examined, no change in status, stable for surgery.  I have reviewed the patient's chart and labs.  Questions were answered to the patient's satisfaction.     Durene Cal

## 2022-11-26 NOTE — Plan of Care (Signed)

## 2022-11-26 NOTE — Progress Notes (Signed)
   11/26/22 1755  Vitals  Temp 98.4 F (36.9 C)  Pulse Rate 89  Resp 14  BP (!) 125/57  SpO2 98 %  Post Treatment  Dialyzer Clearance Clear  Hemodialysis Intake (mL) 0 mL  Liters Processed 70.5  Fluid Removed (mL) 2000 mL  Tolerated HD Treatment Yes   Received patient in bed to unit.  Alert and oriented.  Informed consent signed and in chart.   TX duration:2 hrs 56 mins pt signed of ama early  Patient tolerated well.  Transported back to the room  Alert, without acute distress.  Hand-off given to patient's nurse.   Access used: [facility] Access issues: none  Total UF removed: 2L Medication(s) given: none   [facility]

## 2022-11-27 DIAGNOSIS — I10 Essential (primary) hypertension: Secondary | ICD-10-CM | POA: Diagnosis not present

## 2022-11-27 DIAGNOSIS — A419 Sepsis, unspecified organism: Secondary | ICD-10-CM | POA: Diagnosis not present

## 2022-11-27 DIAGNOSIS — E1151 Type 2 diabetes mellitus with diabetic peripheral angiopathy without gangrene: Secondary | ICD-10-CM | POA: Diagnosis not present

## 2022-11-27 DIAGNOSIS — N186 End stage renal disease: Secondary | ICD-10-CM | POA: Diagnosis not present

## 2022-11-27 LAB — BPAM RBC
Blood Product Expiration Date: 202410152359
Blood Product Expiration Date: 202410152359
ISSUE DATE / TIME: 202409181548
Unit Type and Rh: 5100
Unit Type and Rh: 5100

## 2022-11-27 LAB — TYPE AND SCREEN
ABO/RH(D): O POS
Antibody Screen: NEGATIVE
Donor AG Type: NEGATIVE
Donor AG Type: NEGATIVE
Unit division: 0
Unit division: 0

## 2022-11-27 LAB — RENAL FUNCTION PANEL
Albumin: 2.1 g/dL — ABNORMAL LOW (ref 3.5–5.0)
Anion gap: 17 — ABNORMAL HIGH (ref 5–15)
BUN: 17 mg/dL (ref 6–20)
CO2: 18 mmol/L — ABNORMAL LOW (ref 22–32)
Calcium: 8.1 mg/dL — ABNORMAL LOW (ref 8.9–10.3)
Chloride: 97 mmol/L — ABNORMAL LOW (ref 98–111)
Creatinine, Ser: 3.14 mg/dL — ABNORMAL HIGH (ref 0.44–1.00)
GFR, Estimated: 17 mL/min — ABNORMAL LOW (ref >60)
Glucose, Bld: 132 mg/dL — ABNORMAL HIGH (ref 70–99)
Phosphorus: 4.1 mg/dL (ref 2.5–4.6)
Potassium: 3.3 mmol/L — ABNORMAL LOW (ref 3.5–5.1)
Sodium: 132 mmol/L — ABNORMAL LOW (ref 135–145)

## 2022-11-27 LAB — CBC
HCT: 26.1 % — ABNORMAL LOW (ref 36.0–46.0)
Hemoglobin: 7.6 g/dL — ABNORMAL LOW (ref 12.0–15.0)
MCH: 27.4 pg (ref 26.0–34.0)
MCHC: 29.1 g/dL — ABNORMAL LOW (ref 30.0–36.0)
MCV: 94.2 fL (ref 80.0–100.0)
Platelets: 466 10*3/uL — ABNORMAL HIGH (ref 150–400)
RBC: 2.77 MIL/uL — ABNORMAL LOW (ref 3.87–5.11)
RDW: 16.4 % — ABNORMAL HIGH (ref 11.5–15.5)
WBC: 20.5 10*3/uL — ABNORMAL HIGH (ref 4.0–10.5)
nRBC: 0.1 % (ref 0.0–0.2)

## 2022-11-27 LAB — HEPARIN LEVEL (UNFRACTIONATED)
Heparin Unfractionated: 0.31 IU/mL (ref 0.30–0.70)
Heparin Unfractionated: 0.51 IU/mL (ref 0.30–0.70)

## 2022-11-27 LAB — GLUCOSE, CAPILLARY
Glucose-Capillary: 99 mg/dL (ref 70–99)
Glucose-Capillary: 105 mg/dL — ABNORMAL HIGH (ref 70–99)
Glucose-Capillary: 138 mg/dL — ABNORMAL HIGH (ref 70–99)
Glucose-Capillary: 124 mg/dL — ABNORMAL HIGH (ref 70–99)

## 2022-11-27 LAB — APTT
aPTT: 58 seconds — ABNORMAL HIGH (ref 24–36)
aPTT: 93 seconds — ABNORMAL HIGH (ref 24–36)

## 2022-11-27 MED ORDER — POTASSIUM CHLORIDE CRYS ER 20 MEQ PO TBCR
40.0000 meq | EXTENDED_RELEASE_TABLET | Freq: Once | ORAL | Status: AC
  Administered 2022-11-27: 40 meq via ORAL
  Filled 2022-11-27: qty 2

## 2022-11-27 NOTE — Progress Notes (Addendum)
PROGRESS NOTE        PATIENT DETAILS Name: VRINDA NASSIRI Age: 57 y.o. Sex: female Date of Birth: August 28, 1965 Admit Date: 11/22/2022 Admitting Physician Dolly Rias, MD PCP:Pcp, No  Brief Summary: Patient is a 57 y.o.  female with history of ESRD, PAF on Eliquis, HFpEF, HTN, DM-2, malignant carcinoid, NAFLD-who presented with weakness/fatigue several episodes of nausea/vomiting-found to have sepsis-likely due to soft tissue infection involving right ankle.  See below for further details.  Significant events: 9/16>> admit to [facility]  Significant studies: 9/17>> CXR: No PNA 9/17>> RUQ ultrasound: No acute abnormality 9/18>> MRI right foot: No acute osteomyelitis.  Significant microbiology data: 9/17>> COVID/influenza/RSV PCR: Negative 9/17>> blood culture: No growth  Procedures: None  Consults: Nephrology Orthopedics  Subjective: Much more calm compared to yesterday.  Has abdominal pain but this is at baseline and chronic.  No vomiting-had a bowel movement yesterday per patient.  Objective: Vitals: Blood pressure 125/61, pulse 97, temperature (!) 97.4 F (36.3 C), temperature source Oral, resp. rate 16, weight 129.9 kg, last menstrual period 10/10/2012, SpO2 99%.   Exam: Gen Exam:Alert awake-not in any distress HEENT:atraumatic, normocephalic Chest: B/L clear to auscultation anteriorly CVS:S1S2 regular Abdomen:soft non tender, non distended Extremities:no edema.  See picture taken several days ago regarding right heel wound. Neurology: Non focal-but has generalized weakness. Skin: no rash        Pertinent Labs/Radiology:    Latest Ref Rng & Units 11/27/2022    4:53 AM 11/26/2022    7:00 AM 11/25/2022    6:09 AM  CBC  WBC 4.0 - 10.5 K/uL 20.5  17.1  19.1   Hemoglobin 12.0 - 15.0 g/dL 7.6  7.8  7.6   Hematocrit 36.0 - 46.0 % 26.1  27.0  26.8   Platelets 150 - 400 K/uL 466  488  429     Lab Results  Component Value Date   NA  132 (L) 11/27/2022   K 3.3 (L) 11/27/2022   CL 97 (L) 11/27/2022   CO2 18 (L) 11/27/2022     Assessment/Plan: Sepsis Unclear etiology-initially thought to have osteomyelitis of right ankle but MRI is negative-wound is chronic and does not appear overtly infected.  No other source of infection apparent-her HD site is clean.  Does have some abdominal pain but it is mostly chronic and CT of the abdomen was negative. All cultures negative so far- Continues to have persistent leukocytosis but appears to have some amount of chronic leukocytosis at baseline (probably from chronic inflammation in right heel) Continue empiric antibiotics-will discuss with infectious disease today.   Addendum: D/w Dr Jac Canavan has reviewed chart-recommendations are to stop IV Abx and monitor. Some worsening leukocytosis could from solumedrol she received yesterday (allergy to dye-angiogram postponed)   Chronic right heel ulcer No evidence of osteomyelitis on MRI Vascular surgery had planned angiogram on 9/20-but got placed on due to IV access issues-angiogram now planned for this coming Monday.   Dr. Rodney Booze surgery following  ESRD on HD Nephrology following and directing care  Normocytic anemia Multifactorial-chronic anemia due to ESRD/CKD-worsened by anemia due to acute illness No evidence of GI bleeding/blood loss CBC stable-s/p 1 unit of PRBC on 9/18. Follow CBC periodically.    Minimally elevated troponin Demand ischemia No chest pain/anginal symptoms Supportive care  Transaminitis Minimal  Has resolved with just supportive care.  Hyponatremia  Mild-asymptomatic Should improve with volume removal with HD  Chronic HFpEF Some leg edema but otherwise stable Volume removal with HD  PAF Telemetry monitoring Eliquis held in anticipation of angiogram-on IV heparin  HTN All antihypertensives held-as blood pressure remains soft due to sepsis physiology  History of duodenal  carcinoma/carcinoid-s/p resection 2021 Chronic pain syndrome Narcotic dependence Supportive care Per nursing staff-has been repeatedly asking for IV Dilaudid-exhibiting some pain seeking behavior.  Continue narcotics at home dose.  DM-2 (A1c 8.19/13) CBG relatively stable-currently n.p.o. Continue Semglee 30 units daily with SSI Follow/adjust.  Recent Labs    11/26/22 1842 11/26/22 2202 11/27/22 0832  GLUCAP 140* 142* 99     Mood disorder Relatively stable Cymbalta/Remeron  Debility/deconditioning Mostly wheelchair-bound-able to take a few steps to go to the bathroom etc. PT/OT  Morbid Obesity: Estimated body mass index is 46.22 kg/m as calculated from the following:   Height as of 11/18/22: 5\' 6"  (1.676 m).   Weight as of this encounter: 129.9 kg.   Pressure ulcer Agree with assessment and plan as outlined below.  Pressure Injury 11/23/22 Ischial tuberosity Left Unstageable - Full thickness tissue loss in which the base of the injury is covered by slough (yellow, tan, gray, green or brown) and/or eschar (tan, brown or black) in the wound bed. (Active)  11/23/22 0853  Location: Ischial tuberosity  Location Orientation: Left  Staging: Unstageable - Full thickness tissue loss in which the base of the injury is covered by slough (yellow, tan, gray, green or brown) and/or eschar (tan, brown or black) in the wound bed.  Wound Description (Comments):   Present on Admission: Yes     Pressure Injury 11/23/22 Heel Right Unstageable - Full thickness tissue loss in which the base of the injury is covered by slough (yellow, tan, gray, green or brown) and/or eschar (tan, brown or black) in the wound bed. (Active)  11/23/22 0854  Location: Heel  Location Orientation: Right  Staging: Unstageable - Full thickness tissue loss in which the base of the injury is covered by slough (yellow, tan, gray, green or brown) and/or eschar (tan, brown or black) in the wound bed.  Wound Description  (Comments):   Present on Admission: Yes     Pressure Injury 11/24/22 Thigh Proximal;Right;Medial;Upper Stage 2 -  Partial thickness loss of dermis presenting as a shallow open injury with a red, pink wound bed without slough. stage two injury to upper thigh into groin region. (Active)  11/24/22 1452  Location: Thigh  Location Orientation: Proximal;Right;Medial;Upper  Staging: Stage 2 -  Partial thickness loss of dermis presenting as a shallow open injury with a red, pink wound bed without slough.  Wound Description (Comments): stage two injury to upper thigh into groin region.  Present on Admission: Yes      Code status:   Code Status: Full Code   DVT Prophylaxis: IV heparin    Family Communication: None at bedside   Disposition Plan: Status is: Inpatient Remains inpatient appropriate because: Severity of illness   Planned Discharge Destination:Home   Diet: Diet Order             Diet renal/carb modified with fluid restriction Diet-HS Snack? Nothing; Fluid restriction: 1200 mL Fluid; Room service appropriate? Yes; Fluid consistency: Thin  Diet effective now                     Antimicrobial agents: Anti-infectives (From admission, onward)    Start     Dose/Rate Route Frequency  Ordered Stop   11/24/22 1200  vancomycin (VANCOCIN) IVPB 1000 mg/200 mL premix        1,000 mg 200 mL/hr over 60 Minutes Intravenous Every M-W-F (Hemodialysis) 11/24/22 0826 12/01/22 1159   11/24/22 0100  ceFEPIme (MAXIPIME) 1 g in sodium chloride 0.9 % 100 mL IVPB        1 g 200 mL/hr over 30 Minutes Intravenous Every 24 hours 11/23/22 0332 12/01/22 0059   11/23/22 0900  metroNIDAZOLE (FLAGYL) tablet 500 mg        500 mg Oral Every 8 hours 11/23/22 0332     11/23/22 0343  vancomycin variable dose per unstable renal function (pharmacist dosing)  Status:  Discontinued         Does not apply See admin instructions 11/23/22 0343 11/25/22 0852   11/23/22 0100  aztreonam (AZACTAM) 2 g in  sodium chloride 0.9 % 100 mL IVPB  Status:  Discontinued        2 g 200 mL/hr over 30 Minutes Intravenous  Once 11/23/22 0045 11/23/22 0051   11/23/22 0100  metroNIDAZOLE (FLAGYL) IVPB 500 mg        500 mg 100 mL/hr over 60 Minutes Intravenous  Once 11/23/22 0045 11/23/22 0231   11/23/22 0100  vancomycin (VANCOCIN) IVPB 1000 mg/200 mL premix  Status:  Discontinued        1,000 mg 200 mL/hr over 60 Minutes Intravenous  Once 11/23/22 0045 11/23/22 0051   11/23/22 0100  vancomycin (VANCOCIN) 2,500 mg in sodium chloride 0.9 % 500 mL IVPB        2,500 mg 262.5 mL/hr over 120 Minutes Intravenous  Once 11/23/22 0051 11/23/22 0506   11/23/22 0100  ceFEPIme (MAXIPIME) 2 g in sodium chloride 0.9 % 100 mL IVPB        2 g 200 mL/hr over 30 Minutes Intravenous STAT 11/23/22 0051 11/23/22 0204        MEDICATIONS: Scheduled Meds:  doxercalciferol  4 mcg Intravenous Q M,W,F-HD   DULoxetine  40 mg Oral Daily   ferric citrate  420 mg Oral TID WC   insulin aspart  0-15 Units Subcutaneous TID WC   insulin glargine-yfgn  30 Units Subcutaneous Daily   leptospermum manuka honey  1 Application Topical Daily   metroNIDAZOLE  500 mg Oral Q8H   midodrine  5 mg Oral TID WC   mirtazapine  15 mg Oral QHS   pantoprazole  40 mg Oral Daily   potassium chloride  40 mEq Oral Once   sodium chloride flush  10-40 mL Intracatheter Q12H   sodium chloride flush  3 mL Intravenous Q12H   Continuous Infusions:  ceFEPime (MAXIPIME) IV 1 g (11/27/22 0054)   heparin 1,600 Units/hr (11/27/22 0631)   vancomycin Stopped (11/24/22 2115)   PRN Meds:.albuterol, fentaNYL (SUBLIMAZE) injection, HYDROmorphone **OR** HYDROmorphone, ondansetron (ZOFRAN) IV, sodium chloride flush   I have personally reviewed following labs and imaging studies  LABORATORY DATA: CBC: Recent Labs  Lab 11/23/22 0023 11/23/22 0218 11/24/22 0513 11/24/22 2250 11/25/22 0609 11/26/22 0700 11/27/22 0453  WBC 30.3*  --  19.8*  --  19.1* 17.1*  20.5*  NEUTROABS 27.1*  --  17.0*  --  16.4*  --   --   HGB 7.7*   < > 6.9* 7.6* 7.6* 7.8* 7.6*  HCT 26.6*   < > 23.4* 25.4* 26.8* 27.0* 26.1*  MCV 91.7  --  90.7  --  90.8 93.8 94.2  PLT 345  --  340  --  429* 488* 466*   < > = values in this interval not displayed.    Basic Metabolic Panel: Recent Labs  Lab 11/23/22 0023 11/23/22 0218 11/23/22 1545 11/24/22 0513 11/25/22 0609 11/26/22 0700 11/27/22 0453  NA 127* 127*  --  131* 131* 132* 132*  K 4.0 3.7  --  4.1 3.1* 3.0* 3.3*  CL 86*  --   --  95* 95* 97* 97*  CO2 16*  --   --  25 25 23  18*  GLUCOSE 270*  --   --  87 76 74 132*  BUN 68*  --   --  29* 18 22* 17  CREATININE 10.14*  --   --  5.01* 3.54* 4.06* 3.14*  CALCIUM 9.3  --   --  7.9* 7.6* 7.7* 8.1*  PHOS  --   --  8.4*  --  3.4  --  4.1    GFR: Estimated Creatinine Clearance: 27 mL/min (A) (by C-G formula based on SCr of 3.14 mg/dL (H)).  Liver Function Tests: Recent Labs  Lab 11/23/22 0023 11/24/22 0513 11/25/22 0609 11/27/22 0453  AST 47* 46* 41  --   ALT 15 15 16   --   ALKPHOS 86 54 59  --   BILITOT 1.3* 1.3* 1.1  --   PROT 7.8 6.1* 6.6  --   ALBUMIN 2.2* 1.8* 1.9*  1.9* 2.1*   No results for input(s): "LIPASE", "AMYLASE" in the last 168 hours. No results for input(s): "AMMONIA" in the last 168 hours.  Coagulation Profile: Recent Labs  Lab 11/23/22 0023  INR 1.8*    Cardiac Enzymes: No results for input(s): "CKTOTAL", "CKMB", "CKMBINDEX", "TROPONINI" in the last 168 hours.  BNP (last 3 results) No results for input(s): "PROBNP" in the last 8760 hours.  Lipid Profile: No results for input(s): "CHOL", "HDL", "LDLCALC", "TRIG", "CHOLHDL", "LDLDIRECT" in the last 72 hours.  Thyroid Function Tests: No results for input(s): "TSH", "T4TOTAL", "FREET4", "T3FREE", "THYROIDAB" in the last 72 hours.  Anemia Panel: No results for input(s): "VITAMINB12", "FOLATE", "FERRITIN", "TIBC", "IRON", "RETICCTPCT" in the last 72 hours.  Urine analysis:     Component Value Date/Time   COLORURINE STRAW (A) 06/19/2022 0638   APPEARANCEUR HAZY (A) 06/19/2022 0638   LABSPEC 1.010 06/19/2022 0638   PHURINE 5.0 06/19/2022 0638   GLUCOSEU NEGATIVE 06/19/2022 0638   HGBUR SMALL (A) 06/19/2022 0638   BILIRUBINUR NEGATIVE 06/19/2022 0638   KETONESUR NEGATIVE 06/19/2022 0638   PROTEINUR 30 (A) 06/19/2022 0638   UROBILINOGEN 0.2 10/02/2013 2108   NITRITE NEGATIVE 06/19/2022 0638   LEUKOCYTESUR LARGE (A) 06/19/2022 0638    Sepsis Labs: Lactic Acid, Venous    Component Value Date/Time   LATICACIDVEN 1.0 11/23/2022 0914    MICROBIOLOGY: Recent Results (from the past 240 hour(s))  Resp panel by RT-PCR (RSV, Flu A&B, Covid) Anterior Nasal Swab     Status: None   Collection Time: 11/23/22 12:23 AM   Specimen: Anterior Nasal Swab  Result Value Ref Range Status   SARS Coronavirus 2 by RT PCR NEGATIVE NEGATIVE Final   Influenza A by PCR NEGATIVE NEGATIVE Final   Influenza B by PCR NEGATIVE NEGATIVE Final    Comment: (NOTE) The Xpert Xpress SARS-CoV-2/FLU/RSV plus assay is intended as an aid in the diagnosis of influenza from Nasopharyngeal swab specimens and should not be used as a sole basis for treatment. Nasal washings and aspirates are unacceptable for Xpert Xpress SARS-CoV-2/FLU/RSV testing.  Fact Sheet for Patients: BloggerCourse.com  Fact  Sheet for Healthcare Providers: SeriousBroker.it  This test is not yet approved or cleared by the [place] and has been authorized for detection and/or diagnosis of SARS-CoV-2 by FDA under an Emergency Use Authorization (EUA). This EUA will remain in effect (meaning this test can be used) for the duration of the COVID-19 declaration under Section 564(b)(1) of the Act, 21 U.S.C. section 360bbb-3(b)(1), unless the authorization is terminated or revoked.     Resp Syncytial Virus by PCR NEGATIVE NEGATIVE Final    Comment: (NOTE) Fact Sheet  for Patients: BloggerCourse.com  Fact Sheet for Healthcare Providers: SeriousBroker.it  This test is not yet approved or cleared by the [place] FDA and has been authorized for detection and/or diagnosis of SARS-CoV-2 by FDA under an Emergency Use Authorization (EUA). This EUA will remain in effect (meaning this test can be used) for the duration of the COVID-19 declaration under Section 564(b)(1) of the Act, 21 U.S.C. section 360bbb-3(b)(1), unless the authorization is terminated or revoked.  Performed at [facility], [address], [place], [place] [address]   Blood Culture (routine x 2)     Status: None (Preliminary result)   Collection Time: 11/23/22 12:23 AM   Specimen: BLOOD  Result Value Ref Range Status   Specimen Description BLOOD SITE NOT SPECIFIED  Final   Special Requests   Final    BOTTLES DRAWN AEROBIC AND ANAEROBIC Blood Culture adequate volume   Culture   Final    NO GROWTH 4 DAYS Performed at [facility], [address] [place], [place] [address]    Report Status PENDING  Incomplete    RADIOLOGY STUDIES/RESULTS: No results found.   LOS: 4 days   Jeoffrey Massed, MD  [facility]    To contact the attending provider between 7A-7P or the covering provider during after hours 7P-7A, please log into the web site [email or web address] and access using universal [place] password for that web site. If you do not have the password, please call the hospital operator.  11/27/2022, 9:26 AM

## 2022-11-27 NOTE — Evaluation (Signed)
Physical Therapy Evaluation Patient Details Name: Deborah [name] MRN: [id number] DOB: 25-Dec-1965 Today's Date: 11/27/2022  History of Present Illness  57 y.o. female who presented to the ED 11/23/22 with 1 week progressive fatigue. +sepsis, hypovolemic shock, +wounds (sacrum, L ischium, rt heel). MRI negative for osteomyelitis rt heel, PMH- ESRD on Monday Wednesday Friday dialysis, malignant carcinoid, A-fib, HFpEF, hypertension, diabetes,  Clinical Impression   Pt admitted secondary to problem above with deficits below. PTA patient reports she lived alone and uses a wheelchair for locomotion. (She later reported her husband rolls/bumps her up/down the single step at entry to house).  Pt currently requires no physical assist for bed mobility and refused to attempt sit to stand or OOB to chair due to numbness in bil LEs. She reports this started ~1 week PTA. May do well with +2 assist (for confidence/safety) and use of Stedy initially. Patient states she is certain she can go home despite not being able to stand. Anticipate can make good progress with use of PRAFO on RLE to protect Rt heel. . Anticipate patient will benefit from PT to address problems listed below.Will continue to follow acutely to maximize functional mobility independence and safety.           If plan is discharge home, recommend the following: Other (comment) (currently unable to transfer OOB)   Can travel by private vehicle        Equipment Recommendations None recommended by PT  Recommendations for Other Services       Functional Status Assessment Patient has had a recent decline in their functional status and demonstrates the ability to make significant improvements in function in a reasonable and predictable amount of time.     Precautions / Restrictions Precautions Precaution Comments: Sacral wounds, Rt heel wound Restrictions Weight Bearing Restrictions: Yes RLE Weight Bearing: Non weight bearing Other  Position/Activity Restrictions: Per Dr Lajoyce Corners, ideally will be NWB on rt heel, but put as little weight as possible on rt foot      Mobility  Bed Mobility Overal bed mobility: Modified Independent             General bed mobility comments: side to sit; sit to sidelying to allow dirty linen to be removed (pt transitioned from left to right sidelying passing through sitting EOB); refused to return to supine or side and remained sitting EOB on departure    Transfers                   General transfer comment: refused to attempt due to fear; may do well with +2 assist and stedy lift; educated on potential sliding board transfer and reports she has never done this    Ambulation/Gait                  Stairs            Wheelchair Mobility     Tilt Bed    Modified Rankin (Stroke Patients Only)       Balance Overall balance assessment: No apparent balance deficits (not formally assessed)                                           Pertinent Vitals/Pain Pain Assessment Pain Assessment: Faces Faces Pain Scale: Hurts little more Pain Location: stomach Pain Descriptors / Indicators: Guarding, Moaning Pain Intervention(s): Limited activity within patient's tolerance, Monitored  during session    Home Living Family/patient expects to be discharged to:: Private residence Living Arrangements: Alone Available Help at Discharge: Personal care attendant;Available PRN/intermittently Type of Home: Apartment Home Access: Stairs to enter   Entrance Stairs-Number of Steps: 1   Home Layout: One level Home Equipment: BSC/3in1;Shower seat;Wheelchair - manual Additional Comments: patient has PCA 6 days a week 3 hours a day.    Prior Function Prior Level of Function : Needs assist             Mobility Comments: pivoting to wheelchair; propelling w/c; states husband bumps her up/down (in wheelchair) the single step at entry ADLs Comments:  wheelchair level at home. can do bathing and dressing tasks does not wear socks.     Extremity/Trunk Assessment   Upper Extremity Assessment Upper Extremity Assessment: Defer to OT evaluation    Lower Extremity Assessment Lower Extremity Assessment: RLE deficits/detail;LLE deficits/detail RLE Deficits / Details: Strength grossly 4/5 (pt did not want PT to touch her legs); ROM WFL; reports decr sensation from waist down x 1 week; feet with darkening/blackening when dependent with sitting EOB LLE Deficits / Details: Strength grossly 4/5 (pt did not want PT to touch her legs); ROM WFL; reports decr sensation from waist down x 1 week; feet with darkening/blackening when dependent with sitting EOB    Cervical / Trunk Assessment Cervical / Trunk Assessment: Other exceptions Cervical / Trunk Exceptions: overweight; excellent strength as moving sidelying to opposite sidelying from seated EOB position  Communication   Communication Communication: No apparent difficulties  Cognition Arousal: Alert Behavior During Therapy: Lability (tearful) Overall Cognitive Status: Difficult to assess                                          General Comments General comments (skin integrity, edema, etc.): Brother called during session and pt became very tearful. He was very encouraging re: her need to move and exercise.    Exercises General Exercises - Lower Extremity Long Arc Quad: AROM, Both, 10 reps, Seated   Assessment/Plan    PT Assessment Patient needs continued PT services  PT Problem List Decreased strength;Decreased mobility;Decreased knowledge of use of DME;Impaired sensation;Obesity;Decreased skin integrity;Pain       PT Treatment Interventions DME instruction;Gait training;Functional mobility training;Therapeutic activities;Therapeutic exercise;Balance training;Patient/family education;Wheelchair mobility training    PT Goals (Current goals can be found in the Care Plan  section)  Acute Rehab PT Goals Patient Stated Goal: return home at w/c level as PTA PT Goal Formulation: With patient Time For Goal Achievement: 12/11/22 Potential to Achieve Goals: Good    Frequency Min 1X/week     Co-evaluation               AM-PAC PT "6 Clicks" Mobility  Outcome Measure Help needed turning from your back to your side while in a flat bed without using bedrails?: None Help needed moving from lying on your back to sitting on the side of a flat bed without using bedrails?: None Help needed moving to and from a bed to a chair (including a wheelchair)?: Total Help needed standing up from a chair using your arms (e.g., wheelchair or bedside chair)?: Total Help needed to walk in hospital room?: Total Help needed climbing 3-5 steps with a railing? : Total 6 Click Score: 12    End of Session   Activity Tolerance: Patient tolerated treatment well  Patient left: in bed;with call bell/phone within reach;Other (comment) (sitting EOB) Nurse Communication: Mobility status;Other (comment) (remains sitting EOB and wants to do her bath) PT Visit Diagnosis: Muscle weakness (generalized) (M62.81);Difficulty in walking, not elsewhere classified (R26.2)    Time: 1610-9604 PT Time Calculation (min) (ACUTE ONLY): 34 min   Charges:   PT Evaluation $PT Eval Low Complexity: 1 Low PT Treatments $Therapeutic Activity: 8-22 mins PT General Charges $$ ACUTE PT VISIT: 1 Visit          Jerolyn Center, PT Acute Rehabilitation Services  Office [phone]   Zena Amos 11/27/2022, 12:11 PM

## 2022-11-27 NOTE — Plan of Care (Signed)

## 2022-11-27 NOTE — Progress Notes (Signed)
Dialysis catheter to Right chest partly out. IV team consult placed.

## 2022-11-27 NOTE — Progress Notes (Signed)
[facility] Progress Note   Subjective: Was quiet until I entered room, now crying and complaining of pain. She has sufficient medication for management of pain. Net UF 2.0 Liters with HD 11/26/2022  Objective Vitals:   11/27/22 0441 11/27/22 0500 11/27/22 0541 11/27/22 0800  BP: (!) 92/46  (!) 142/73 125/61  Pulse: 93   97  Resp: (!) 24  20 16   Temp: 97.6 F (36.4 C)   (!) 97.4 F (36.3 C)  TempSrc: Axillary   Oral  SpO2:    99%  Weight:  129.9 kg     Physical Exam General: Chronically ill appearing obese female crying out in pain Heart: S1, S2 RRR No M/R/G Lungs: CTAB A/P Abdomen: NABS NT Extremities: Trace BLE edema. Drsgs on feet Dialysis Access: R TDC drsg intact  Additional Objective Labs: Basic Metabolic Panel: Recent Labs  Lab 11/23/22 1545 11/24/22 0513 11/25/22 0609 11/26/22 0700 11/27/22 0453  NA  --    < > 131* 132* 132*  K  --    < > 3.1* 3.0* 3.3*  CL  --    < > 95* 97* 97*  CO2  --    < > 25 23 18*  GLUCOSE  --    < > 76 74 132*  BUN  --    < > 18 22* 17  CREATININE  --    < > 3.54* 4.06* 3.14*  CALCIUM  --    < > 7.6* 7.7* 8.1*  PHOS 8.4*  --  3.4  --  4.1   < > = values in this interval not displayed.   Liver Function Tests: Recent Labs  Lab 11/23/22 0023 11/24/22 0513 11/25/22 0609 11/27/22 0453  AST 47* 46* 41  --   ALT 15 15 16   --   ALKPHOS 86 54 59  --   BILITOT 1.3* 1.3* 1.1  --   PROT 7.8 6.1* 6.6  --   ALBUMIN 2.2* 1.8* 1.9*  1.9* 2.1*   No results for input(s): "LIPASE", "AMYLASE" in the last 168 hours. CBC: Recent Labs  Lab 11/23/22 0023 11/23/22 0218 11/24/22 0513 11/24/22 2250 11/25/22 0609 11/26/22 0700 11/27/22 0453  WBC 30.3*  --  19.8*  --  19.1* 17.1* 20.5*  NEUTROABS 27.1*  --  17.0*  --  16.4*  --   --   HGB 7.7*   < > 6.9*   < > 7.6* 7.8* 7.6*  HCT 26.6*   < > 23.4*   < > 26.8* 27.0* 26.1*  MCV 91.7  --  90.7  --  90.8 93.8 94.2  PLT 345  --  340  --  429* 488* 466*   < > = values in  this interval not displayed.   Blood Culture    Component Value Date/Time   SDES BLOOD SITE NOT SPECIFIED 11/23/2022 0023   SPECREQUEST  11/23/2022 0023    BOTTLES DRAWN AEROBIC AND ANAEROBIC Blood Culture adequate volume   CULT  11/23/2022 0023    NO GROWTH 4 DAYS Performed at [facility], [address], [place], [place] [address]    REPTSTATUS PENDING 11/23/2022 0023    Cardiac Enzymes: No results for input(s): "CKTOTAL", "CKMB", "CKMBINDEX", "TROPONINI" in the last 168 hours. CBG: Recent Labs  Lab 11/26/22 1002 11/26/22 1128 11/26/22 1842 11/26/22 2202 11/27/22 0832  GLUCAP 99 121* 140* 142* 99   Iron Studies: No results for input(s): "IRON", "TIBC", "TRANSFERRIN", "FERRITIN" in the last 72  hours. @lablastinr3 @ Studies/Results: No results found. Medications:  ceFEPime (MAXIPIME) IV 1 g (11/27/22 0054)   heparin 1,600 Units/hr (11/27/22 0631)   vancomycin Stopped (11/24/22 2115)    doxercalciferol  4 mcg Intravenous Q M,W,F-HD   DULoxetine  40 mg Oral Daily   ferric citrate  420 mg Oral TID WC   insulin aspart  0-15 Units Subcutaneous TID WC   insulin glargine-yfgn  30 Units Subcutaneous Daily   leptospermum manuka honey  1 Application Topical Daily   metroNIDAZOLE  500 mg Oral Q8H   midodrine  5 mg Oral TID WC   mirtazapine  15 mg Oral QHS   pantoprazole  40 mg Oral Daily   sodium chloride flush  10-40 mL Intracatheter Q12H   sodium chloride flush  3 mL Intravenous Q12H      06/30/2022 -- > Dr Chestine Spore of [facility] did L BC AVF placement     OP HD: [facility] MWF  4h  400/800    126.9kg  RIJ TDC/  LUA AVF (maturing)  - Heparin 5000 units IV initial bolus.Heparin 2500 units IV midrun - hectorol 7 mcg IV tiw - venofer 50mg  IV weekly (Hold while getting ABX) - mircera 225 mcg IV q 2 wks     Assessment/ Plan: Sepsis/ SSTI - initially concern for R ankle osteomyelitis.  Responding to IV abx and IV fluids. Blood cx neg to date.  PAD/R ankle ulcer - [facility] following.  Was scheduled for angiogram 11/26/2022 but unable to obtain IV access. Now scheduled for 11/30/2022.  H/O metastatic carcinoid tumor ESRD - on HD MWF.Back on schedule. Next HD 11/30/2022 H/o HTN - BP's soft, antihypertensive meds on hold. Opioids probably contributing to hypotension.  Volume - Improved post HD. Net UF 2 liters with HD 11/27/2022. Na 132.  Anemia esrd - Hb 7.6 s/p 1 Unit PRBCs 9/18. Next  esa is due on 9/27. Follow.  MBD ckd - CCa in range.PO4 at goal. Cont Auryxia binder Hyponatremia - Improving with volume removal. Follow labs.  H/o afib H/o chronic pain Deconditioning - pt is WC to chair/ bed dependent  Izic Stfort H. Amirr Achord NP-C 11/27/2022, 10:44 AM  [employer] [phone]

## 2022-11-27 NOTE — Progress Notes (Signed)
ANTICOAGULATION CONSULT NOTE - Follow Up Consult  Pharmacy Consult for Heparin Indication: atrial fibrillation (apixaban on hold)  Patient Measurements: Weight: 129.9 kg (286 lb 6 oz) Heparin Dosing Weight: 94 kg  Vital Signs: Temp: 97.8 F (36.6 C) (09/21 1214) Temp Source: Oral (09/21 1214) BP: 104/69 (09/21 1214) Pulse Rate: 94 (09/21 1214)  Labs: Recent Labs    11/25/22 0609 11/26/22 0700 11/27/22 0453 11/27/22 1315  HGB 7.6* 7.8* 7.6*  --   HCT 26.8* 27.0* 26.1*  --   PLT 429* 488* 466*  --   APTT 42* 47* 58* 93*  HEPARINUNFRC >1.10* 0.64 0.31 0.51  CREATININE 3.54* 4.06* 3.14*  --     Estimated Creatinine Clearance: 27 mL/min (A) (by C-G formula based on SCr of 3.14 mg/dL (H)).  Assessment: 57 year old female to begin heparin while apixaban is on hold in anticipation of vascular procedure on Monday, 11/29/22.  Last dose of apixaban was 9/18 in AM. Ok to resume IV heparin per Dr Jerral Ralph. Hgb low stable 7s, platelets are elevated. No bleeding or concerns with the line noted.   HL 0.51 - therapeutic  aPTT 93 - therapeutic   aPTT and heparin levels are correlating. No longer need to trend aPTT.   Goal of Therapy:  Heparin level 0.3-0.7 units/ml aPTT 66-102 seconds Monitor platelets by anticoagulation protocol: Yes   Plan:  Continue heparin drip @ 1600 units/hr Daily heparin level, CBC Monitor for s/sx of bleeding F/u resuming Eliquis post-procedure  Thank you for involving pharmacy in this patient's care.  Roslyn Smiling, PharmD PGY1 Pharmacy Resident 11/27/2022 2:47 PM

## 2022-11-27 NOTE — Progress Notes (Signed)
ANTICOAGULATION CONSULT NOTE - Follow Up Consult  Pharmacy Consult for Heparin Indication: atrial fibrillation (apixaban on hold)  Patient Measurements: Weight: 131.8 kg (290 lb 9.1 oz) Heparin Dosing Weight: 94 kg  Vital Signs: Temp: 97.6 F (36.4 C) (09/21 0441) Temp Source: Axillary (09/21 0441) BP: 92/46 (09/21 0441) Pulse Rate: 93 (09/21 0441)  Labs: Recent Labs    11/25/22 0609 11/26/22 0700 11/27/22 0453  HGB 7.6* 7.8* 7.6*  HCT 26.8* 27.0* 26.1*  PLT 429* 488* 466*  APTT 42* 47* 58*  HEPARINUNFRC >1.10* 0.64 0.31  CREATININE 3.54* 4.06* 3.14*    Estimated Creatinine Clearance: 27.2 mL/min (A) (by C-G formula based on SCr of 3.14 mg/dL (H)).  Assessment: 57 year old female to begin heparin while apixaban is on hold in anticipation of vascular procedure on Friday.  Last dose of apixaban this AM at 8 am.  Heparin drip was stopped in anticipation of procedure today however this has been rescheduled for Mon. New IV line placed. Ok to resume IV heparin per Dr Jerral Ralph. Hgb low stable 7s, platelets are elevated. No bleeding noted.   HL 0.31 - therapeutic (low end) aPTT 58 - sub therapeutic   Goal of Therapy:  Heparin level 0.3-0.7 units/ml aPTT 66-102 seconds Monitor platelets by anticoagulation protocol: Yes   Plan:  Increase heparin drip to 1600 units/hr aPTT in 8 hours Daily heparin level, aPTT, CBC Monitor for s/sx of bleeding  Thank you for involving pharmacy in this patient's care.  Calton Dach, PharmD, BCCCP Clinical Pharmacist 11/27/2022 5:47 AM

## 2022-11-28 DIAGNOSIS — A419 Sepsis, unspecified organism: Secondary | ICD-10-CM | POA: Diagnosis not present

## 2022-11-28 LAB — COMPREHENSIVE METABOLIC PANEL
ALT: 18 U/L (ref 0–44)
AST: 24 U/L (ref 15–41)
Albumin: 2.5 g/dL — ABNORMAL LOW (ref 3.5–5.0)
Alkaline Phosphatase: 68 U/L (ref 38–126)
Anion gap: 15 (ref 5–15)
BUN: 23 mg/dL — ABNORMAL HIGH (ref 6–20)
CO2: 25 mmol/L (ref 22–32)
Calcium: 8.4 mg/dL — ABNORMAL LOW (ref 8.9–10.3)
Chloride: 95 mmol/L — ABNORMAL LOW (ref 98–111)
Creatinine, Ser: 3.88 mg/dL — ABNORMAL HIGH (ref 0.44–1.00)
GFR, Estimated: 13 mL/min — ABNORMAL LOW (ref >60)
Glucose, Bld: 119 mg/dL — ABNORMAL HIGH (ref 70–99)
Potassium: 3.1 mmol/L — ABNORMAL LOW (ref 3.5–5.1)
Sodium: 135 mmol/L (ref 135–145)
Total Bilirubin: 1.1 mg/dL (ref 0.3–1.2)
Total Protein: 7.4 g/dL (ref 6.5–8.1)

## 2022-11-28 LAB — CBC
HCT: 29.2 % — ABNORMAL LOW (ref 36.0–46.0)
Hemoglobin: 8.3 g/dL — ABNORMAL LOW (ref 12.0–15.0)
MCH: 26.2 pg (ref 26.0–34.0)
MCHC: 28.4 g/dL — ABNORMAL LOW (ref 30.0–36.0)
MCV: 92.1 fL (ref 80.0–100.0)
Platelets: 541 10*3/uL — ABNORMAL HIGH (ref 150–400)
RBC: 3.17 MIL/uL — ABNORMAL LOW (ref 3.87–5.11)
RDW: 16.3 % — ABNORMAL HIGH (ref 11.5–15.5)
WBC: 15.8 10*3/uL — ABNORMAL HIGH (ref 4.0–10.5)
nRBC: 0 % (ref 0.0–0.2)

## 2022-11-28 LAB — CULTURE, BLOOD (ROUTINE X 2)
Culture: NO GROWTH
Special Requests: ADEQUATE

## 2022-11-28 LAB — HEPARIN LEVEL (UNFRACTIONATED)
Heparin Unfractionated: 0.1 IU/mL — ABNORMAL LOW (ref 0.30–0.70)
Heparin Unfractionated: <0.1 IU/mL — ABNORMAL LOW (ref 0.30–0.70)

## 2022-11-28 LAB — URIC ACID: Uric Acid, Serum: 6.7 mg/dL (ref 2.5–7.1)

## 2022-11-28 LAB — GLUCOSE, CAPILLARY
Glucose-Capillary: 100 mg/dL — ABNORMAL HIGH (ref 70–99)
Glucose-Capillary: 123 mg/dL — ABNORMAL HIGH (ref 70–99)
Glucose-Capillary: 159 mg/dL — ABNORMAL HIGH (ref 70–99)

## 2022-11-28 LAB — C-REACTIVE PROTEIN: CRP: 5.5 mg/dL — ABNORMAL HIGH (ref <1.0)

## 2022-11-28 LAB — PROCALCITONIN: Procalcitonin: 0.53 ng/mL

## 2022-11-28 LAB — MAGNESIUM: Magnesium: 1.5 mg/dL — ABNORMAL LOW (ref 1.7–2.4)

## 2022-11-28 MED ORDER — HEPARIN (PORCINE) 25000 UT/250ML-% IV SOLN
1600.0000 [IU]/h | INTRAVENOUS | Status: DC
  Administered 2022-11-28: 1600 [IU]/h via INTRAVENOUS
  Filled 2022-11-28: qty 250

## 2022-11-28 MED ORDER — METHYLPREDNISOLONE SODIUM SUCC 125 MG IJ SOLR
60.0000 mg | INTRAMUSCULAR | Status: DC
  Administered 2022-11-28 – 2022-11-30 (×3): 60 mg via INTRAVENOUS
  Filled 2022-11-28 (×3): qty 2

## 2022-11-28 MED ORDER — LOPERAMIDE HCL 2 MG PO CAPS
2.0000 mg | ORAL_CAPSULE | Freq: Four times a day (QID) | ORAL | Status: DC | PRN
  Administered 2022-11-28: 2 mg via ORAL
  Filled 2022-11-28: qty 1

## 2022-11-28 MED ORDER — MAGNESIUM SULFATE 4 GM/100ML IV SOLN
4.0000 g | Freq: Once | INTRAVENOUS | Status: AC
  Administered 2022-11-28: 4 g via INTRAVENOUS
  Filled 2022-11-28: qty 100

## 2022-11-28 MED ORDER — POTASSIUM CHLORIDE CRYS ER 20 MEQ PO TBCR
40.0000 meq | EXTENDED_RELEASE_TABLET | Freq: Once | ORAL | Status: AC
  Administered 2022-11-28: 40 meq via ORAL
  Filled 2022-11-28: qty 2

## 2022-11-28 NOTE — Progress Notes (Signed)
Dear Doctor: Alonna Buckler NP This patient has been identified as a candidate for PICC for the following reason (s): restarts due to phlebitis and infiltration in 24 hours If you agree, please write an order for the indicated device. For any questions contact the Vascular Access Team at [phone] if no answer, please leave a message.  Thank you for supporting the early vascular access assessment program.  Need nephrology OK   Also contacted Thedore Mins for PICC order

## 2022-11-28 NOTE — Progress Notes (Signed)
Dear Doctor: Deborah [name] This patient has been identified as a candidate for PICC for the following reason (s): restarts due to phlebitis and infiltration in 24 hours If you agree, please write an order for the indicated device. For any questions contact the Vascular Access Team at [phone] if no answer, please leave a message.  Thank you for supporting the early vascular access assessment program.

## 2022-11-28 NOTE — Progress Notes (Signed)
PROGRESS NOTE        PATIENT DETAILS Name: Deborah [name] Age: 57 y.o. Sex: female Date of Birth: [birth year] Admit Date: 11/22/2022 Admitting Physician Dolly Rias, MD PCP:Pcp, No  Brief Summary: Patient is a 57 y.o.  female with history of ESRD, PAF on Eliquis, HFpEF, HTN, DM-2, malignant carcinoid, NAFLD-who presented with weakness/fatigue several episodes of nausea/vomiting-found to have sepsis-likely due to soft tissue infection involving right ankle.  See below for further details.  Significant events: 9/16>> admit to [name]  Significant studies: 9/17>> CXR: No PNA 9/17>> RUQ ultrasound: No acute abnormality 9/18>> MRI right foot: No acute osteomyelitis.  Significant microbiology data: 9/17>> COVID/influenza/RSV PCR: Negative 9/17>> blood culture: No growth  Procedures: None  Consults: Nephrology Orthopedics  Subjective:  Patient in bed, complains of diffuse pain and aches especially in both lower extremities, denies any chest pain or shortness of breath, no abdominal pain.  Objective: Vitals: Blood pressure 123/77, pulse 98, temperature 97.6 F (36.4 C), temperature source Oral, resp. rate 17, weight 127.5 kg, last menstrual period 10/10/2012, SpO2 100%.   Exam:  Awake Alert, No new F.N deficits, Normal affect [place].AT,PERRAL Supple Neck, No JVD,   Symmetrical Chest wall movement, Good air movement bilaterally, CTAB RRR,No Gallops, Rubs or new Murmurs,  +ve B.Sounds, Abd Soft, No tenderness,     Right foot pictures below, right IJ HD catheter,        Assessment/Plan: Sepsis Unclear etiology-initially thought to have osteomyelitis of right ankle but MRI is negative-wound is chronic and does not appear overtly infected.  No other source of infection apparent-her HD site is clean.  Does have some abdominal pain but it is mostly chronic and CT of the abdomen was negative. All cultures negative so far- Continues to have  persistent leukocytosis but appears to have some amount of chronic leukocytosis at baseline (probably from chronic inflammation in right heel) ID Dr. Thedore Mins was consulted by previous MD on 11/27/2022 over the phone, recommendations to hold antibiotics and monitor clinically.  Antibiotics stopped afternoon of 11/27/2022.    Recent history of disseminated gout.  Currently has generalized body aches and pains.  History of disseminated gout few months ago.  That admission she showed remarkable response to IV steroids, initiate and monitor.  Check uric acid level and inflammatory markers.   Chronic right heel ulcer No evidence of osteomyelitis on MRI Vascular surgery had planned angiogram on 9/20-but got placed on due to IV access issues-angiogram now planned for this coming Monday.   Dr. Rodney Booze surgery following  ESRD on HD Nephrology following and directing care  Normocytic anemia Multifactorial-chronic anemia due to ESRD/CKD-worsened by anemia due to acute illness No evidence of GI bleeding/blood loss CBC stable-s/p 1 unit of PRBC on 9/18. Follow CBC periodically.    Minimally elevated troponin Demand ischemia No chest pain/anginal symptoms Supportive care  Transaminitis Minimal  Has resolved with just supportive care.  Hyponatremia Mild-asymptomatic Should improve with volume removal with HD  Chronic HFpEF Some leg edema but otherwise stable Volume removal with HD  PAF Telemetry monitoring Eliquis held in anticipation of angiogram-on IV heparin  HTN All antihypertensives held-as blood pressure remains soft due to sepsis physiology  History of duodenal carcinoma/carcinoid-s/p resection 2021 Chronic pain syndrome Narcotic dependence Supportive care Per nursing staff-has been repeatedly asking for IV Dilaudid-exhibiting some pain seeking behavior.  Continue narcotics  at home dose.  DM-2 (A1c 8.19/13) CBG relatively stable-currently n.p.o. Continue Semglee 30 units  daily with SSI Follow/adjust.  Recent Labs    11/27/22 1559 11/27/22 2144 11/28/22 0858  GLUCAP 138* 124* 100*     Mood disorder Relatively stable Cymbalta/Remeron  Debility/deconditioning Mostly wheelchair-bound-able to take a few steps to go to the bathroom etc. PT/OT  Morbid Obesity: Estimated body mass index is 45.37 kg/m as calculated from the following:   Height as of 11/18/22: 5\' 6"  (1.676 m).   Weight as of this encounter: 127.5 kg.   Pressure ulcer Agree with assessment and plan as outlined below.  Pressure Injury 11/23/22 Ischial tuberosity Left Unstageable - Full thickness tissue loss in which the base of the injury is covered by slough (yellow, tan, gray, green or brown) and/or eschar (tan, brown or black) in the wound bed. (Active)  11/23/22 0853  Location: Ischial tuberosity  Location Orientation: Left  Staging: Unstageable - Full thickness tissue loss in which the base of the injury is covered by slough (yellow, tan, gray, green or brown) and/or eschar (tan, brown or black) in the wound bed.  Wound Description (Comments):   Present on Admission: Yes     Pressure Injury 11/23/22 Heel Right Unstageable - Full thickness tissue loss in which the base of the injury is covered by slough (yellow, tan, gray, green or brown) and/or eschar (tan, brown or black) in the wound bed. (Active)  11/23/22 0854  Location: Heel  Location Orientation: Right  Staging: Unstageable - Full thickness tissue loss in which the base of the injury is covered by slough (yellow, tan, gray, green or brown) and/or eschar (tan, brown or black) in the wound bed.  Wound Description (Comments):   Present on Admission: Yes     Pressure Injury 11/24/22 Thigh Proximal;Right;Medial;Upper Stage 2 -  Partial thickness loss of dermis presenting as a shallow open injury with a red, pink wound bed without slough. stage two injury to upper thigh into groin region. (Active)  11/24/22 1452  Location: Thigh   Location Orientation: Proximal;Right;Medial;Upper  Staging: Stage 2 -  Partial thickness loss of dermis presenting as a shallow open injury with a red, pink wound bed without slough.  Wound Description (Comments): stage two injury to upper thigh into groin region.  Present on Admission: Yes      Code status:   Code Status: Full Code   DVT Prophylaxis: IV heparin    Family Communication: None at bedside   Disposition Plan: Status is: Inpatient Remains inpatient appropriate because: Severity of illness   Planned Discharge Destination:Home   Diet: Diet Order             Diet renal/carb modified with fluid restriction Diet-HS Snack? Nothing; Fluid restriction: 1200 mL Fluid; Room service appropriate? Yes; Fluid consistency: Thin  Diet effective now                   MEDICATIONS: Scheduled Meds:  doxercalciferol  4 mcg Intravenous Q M,W,F-HD   DULoxetine  40 mg Oral Daily   ferric citrate  420 mg Oral TID WC   insulin aspart  0-15 Units Subcutaneous TID WC   insulin glargine-yfgn  30 Units Subcutaneous Daily   leptospermum manuka honey  1 Application Topical Daily   methylPREDNISolone (SOLU-MEDROL) injection  60 mg Intravenous Q24H   metroNIDAZOLE  500 mg Oral Q8H   midodrine  5 mg Oral TID WC   mirtazapine  15 mg Oral QHS  pantoprazole  40 mg Oral Daily   sodium chloride flush  10-40 mL Intracatheter Q12H   sodium chloride flush  3 mL Intravenous Q12H   Continuous Infusions:  heparin Stopped (11/28/22 0430)   vancomycin Stopped (11/24/22 2115)   PRN Meds:.albuterol, fentaNYL (SUBLIMAZE) injection, HYDROmorphone **OR** HYDROmorphone, ondansetron (ZOFRAN) IV, sodium chloride flush   I have personally reviewed following labs and imaging studies  LABORATORY DATA:  Recent Labs  Lab 11/23/22 0023 11/23/22 0218 11/24/22 0513 11/24/22 2250 11/25/22 0609 11/26/22 0700 11/27/22 0453  WBC 30.3*  --  19.8*  --  19.1* 17.1* 20.5*  HGB 7.7*   < > 6.9* 7.6*  7.6* 7.8* 7.6*  HCT 26.6*   < > 23.4* 25.4* 26.8* 27.0* 26.1*  PLT 345  --  340  --  429* 488* 466*  MCV 91.7  --  90.7  --  90.8 93.8 94.2  MCH 26.6  --  26.7  --  25.8* 27.1 27.4  MCHC 28.9*  --  29.5*  --  28.4* 28.9* 29.1*  RDW 16.7*  --  16.5*  --  16.4* 16.5* 16.4*  LYMPHSABS 1.8  --  1.3  --  1.3  --   --   MONOABS 0.7  --  0.7  --  0.8  --   --   EOSABS 0.1  --  0.4  --  0.3  --   --   BASOSABS 0.1  --  0.1  --  0.1  --   --    < > = values in this interval not displayed.    Recent Labs  Lab 11/23/22 0023 11/23/22 0035 11/23/22 0217 11/23/22 0218 11/23/22 0515 11/23/22 0914 11/24/22 0513 11/25/22 0609 11/26/22 0700 11/27/22 0453  NA 127*  --   --  127*  --   --  131* 131* 132* 132*  K 4.0  --   --  3.7  --   --  4.1 3.1* 3.0* 3.3*  CL 86*  --   --   --   --   --  95* 95* 97* 97*  CO2 16*  --   --   --   --   --  25 25 23  18*  ANIONGAP 25*  --   --   --   --   --  11 11 12  17*  GLUCOSE 270*  --   --   --   --   --  87 76 74 132*  BUN 68*  --   --   --   --   --  29* 18 22* 17  CREATININE 10.14*  --   --   --   --   --  5.01* 3.54* 4.06* 3.14*  AST 47*  --   --   --   --   --  46* 41  --   --   ALT 15  --   --   --   --   --  15 16  --   --   ALKPHOS 86  --   --   --   --   --  54 59  --   --   BILITOT 1.3*  --   --   --   --   --  1.3* 1.1  --   --   ALBUMIN 2.2*  --   --   --   --   --  1.8* 1.9*  1.9*  --  2.1*  CRP  --   --   --   --   --   --  21.1*  --   --   --   LATICACIDVEN  --  4.4* 3.8*  --  1.7 1.0  --   --   --   --   INR 1.8*  --   --   --   --   --   --   --   --   --   BNP 120.4*  --   --   --   --   --   --   --   --   --   CALCIUM 9.3  --   --   --   --   --  7.9* 7.6* 7.7* 8.1*     Recent Labs  Lab 11/23/22 0023 11/23/22 0035 11/23/22 0217 11/23/22 0515 11/23/22 0914 11/24/22 0513 11/25/22 0609 11/26/22 0700 11/27/22 0453  CRP  --   --   --   --   --  21.1*  --   --   --   LATICACIDVEN  --  4.4* 3.8* 1.7 1.0  --   --   --   --   INR  1.8*  --   --   --   --   --   --   --   --   BNP 120.4*  --   --   --   --   --   --   --   --   CALCIUM 9.3  --   --   --   --  7.9* 7.6* 7.7* 8.1*      RADIOLOGY STUDIES/RESULTS: No results found.   LOS: 5 days    Signature  -    Susa Raring M.D on 11/28/2022 at 9:49 AM   -  To page go to [email or web address]

## 2022-11-28 NOTE — Plan of Care (Signed)

## 2022-11-28 NOTE — Progress Notes (Signed)
[name] Progress Note   Subjective: Still C/O severe pain. Emotional support to patient and family. Encouraged her to ask for pain medications before pain becomes severe.      Objective Vitals:   11/28/22 0357 11/28/22 0400 11/28/22 0500 11/28/22 0853  BP: 127/89 117/79  123/77  Pulse: 92 98    Resp: 18 20  17   Temp: 98.8 F (37.1 C)   97.6 F (36.4 C)  TempSrc: Oral   Oral  SpO2: 90% 100%    Weight:   127.5 kg    hysical Exam General: Chronically ill appearing obese female crying out in pain Heart: S1, S2 RRR No M/R/G Lungs: CTAB A/P Abdomen: NABS NT Extremities: Trace BLE edema. Drsgs on feet Dialysis Access: R TDC drsg intact  Additional Objective Labs: Basic Metabolic Panel: Recent Labs  Lab 11/23/22 1545 11/24/22 0513 11/25/22 0609 11/26/22 0700 11/27/22 0453  NA  --    < > 131* 132* 132*  K  --    < > 3.1* 3.0* 3.3*  CL  --    < > 95* 97* 97*  CO2  --    < > 25 23 18*  GLUCOSE  --    < > 76 74 132*  BUN  --    < > 18 22* 17  CREATININE  --    < > 3.54* 4.06* 3.14*  CALCIUM  --    < > 7.6* 7.7* 8.1*  PHOS 8.4*  --  3.4  --  4.1   < > = values in this interval not displayed.   Liver Function Tests: Recent Labs  Lab 11/23/22 0023 11/24/22 0513 11/25/22 0609 11/27/22 0453  AST 47* 46* 41  --   ALT 15 15 16   --   ALKPHOS 86 54 59  --   BILITOT 1.3* 1.3* 1.1  --   PROT 7.8 6.1* 6.6  --   ALBUMIN 2.2* 1.8* 1.9*  1.9* 2.1*   No results for input(s): "LIPASE", "AMYLASE" in the last 168 hours. CBC: Recent Labs  Lab 11/23/22 0023 11/23/22 0218 11/24/22 0513 11/24/22 2250 11/25/22 0609 11/26/22 0700 11/27/22 0453  WBC 30.3*  --  19.8*  --  19.1* 17.1* 20.5*  NEUTROABS 27.1*  --  17.0*  --  16.4*  --   --   HGB 7.7*   < > 6.9*   < > 7.6* 7.8* 7.6*  HCT 26.6*   < > 23.4*   < > 26.8* 27.0* 26.1*  MCV 91.7  --  90.7  --  90.8 93.8 94.2  PLT 345  --  340  --  429* 488* 466*   < > = values in this interval not displayed.    Blood Culture    Component Value Date/Time   SDES BLOOD SITE NOT SPECIFIED 11/23/2022 0023   SPECREQUEST  11/23/2022 0023    BOTTLES DRAWN AEROBIC AND ANAEROBIC Blood Culture adequate volume   CULT  11/23/2022 0023    NO GROWTH 5 DAYS Performed at [facility], [address], [place], [place] [address]    REPTSTATUS 11/28/2022 FINAL 11/23/2022 0023    Cardiac Enzymes: No results for input(s): "CKTOTAL", "CKMB", "CKMBINDEX", "TROPONINI" in the last 168 hours. CBG: Recent Labs  Lab 11/27/22 0832 11/27/22 1213 11/27/22 1559 11/27/22 2144 11/28/22 0858  GLUCAP 99 105* 138* 124* 100*   Iron Studies: No results for input(s): "IRON", "TIBC", "TRANSFERRIN", "FERRITIN" in the last 72 hours. @lablastinr3 @ Studies/Results: No results  found. Medications:  heparin 1,600 Units/hr (11/28/22 1005)   vancomycin Stopped (11/24/22 2115)    doxercalciferol  4 mcg Intravenous Q M,W,F-HD   DULoxetine  40 mg Oral Daily   ferric citrate  420 mg Oral TID WC   insulin aspart  0-15 Units Subcutaneous TID WC   insulin glargine-yfgn  30 Units Subcutaneous Daily   leptospermum manuka honey  1 Application Topical Daily   methylPREDNISolone (SOLU-MEDROL) injection  60 mg Intravenous Q24H   metroNIDAZOLE  500 mg Oral Q8H   midodrine  5 mg Oral TID WC   mirtazapine  15 mg Oral QHS   pantoprazole  40 mg Oral Daily   sodium chloride flush  10-40 mL Intracatheter Q12H   sodium chloride flush  3 mL Intravenous Q12H     06/30/2022 -- > Dr Chestine Spore [facility] did L BC AVF placement     OP HD: [facility] MWF  4h  400/800    126.9kg  RIJ TDC/  LUA AVF (maturing)  - Heparin 5000 units IV initial bolus.Heparin 2500 units IV midrun - hectorol 7 mcg IV tiw - venofer 50mg  IV weekly (Hold while getting ABX) - mircera 225 mcg IV q 2 wks     Assessment/ Plan: Sepsis/ SSTI - initially concern for R ankle osteomyelitis.  Responding to IV abx and IV fluids. Blood cx neg to date.  PAD/R ankle ulcer - [facility]  following. Was scheduled for angiogram 11/26/2022 but unable to obtain IV access. Now scheduled for 11/29/2022.  H/O metastatic carcinoid tumor ESRD - on HD MWF.Back on schedule. Next HD 11/29/2022 H/o HTN - BP's soft, antihypertensive meds on hold. Opioids probably contributing to hypotension.  Volume - Improved post HD. Net UF 2 liters with HD 11/27/2022. Na 132.  Anemia esrd - Hb 7.6 s/p 1 Unit PRBCs 9/18. Next  esa is due on 9/27. Follow HGB. Transfuse if HGB < 7.0.  MBD ckd - CCa in range.PO4 at goal. Cont Auryxia binder Hyponatremia - Improving with volume removal. Follow labs.  H/o afib H/o chronic pain Deconditioning - pt is WC to chair/ bed dependent  Jamerica Snavely H. Dulcie Gammon NP-C 11/28/2022, 11:04 AM  [employer] [phone]

## 2022-11-28 NOTE — Progress Notes (Signed)
ANTICOAGULATION CONSULT NOTE - Follow Up Consult  Pharmacy Consult for Heparin Indication: atrial fibrillation (apixaban on hold)  Patient Measurements: Weight: 127.5 kg (281 lb 1.4 oz) Heparin Dosing Weight: 94 kg  Vital Signs: Temp: 97.7 F (36.5 C) (09/22 1544) Temp Source: Oral (09/22 1544) BP: 117/58 (09/22 1544) Pulse Rate: 93 (09/22 1544)  Labs: Recent Labs    11/26/22 0700 11/27/22 0453 11/27/22 1315 11/28/22 1206 11/28/22 1807  HGB 7.8* 7.6*  --  8.3*  --   HCT 27.0* 26.1*  --  29.2*  --   PLT 488* 466*  --  541*  --   APTT 47* 58* 93*  --   --   HEPARINUNFRC 0.64 0.31 0.51 0.10* <0.10*  CREATININE 4.06* 3.14*  --  3.88*  --     Estimated Creatinine Clearance: 21.6 mL/min (A) (by C-G formula based on SCr of 3.88 mg/dL (H)).  Assessment: 57 year old female to begin heparin while apixaban is on hold in anticipation of vascular procedure on Monday, 11/29/22.  Last dose of apixaban was 9/18 in AM. Ok to resume IV heparin per Dr Jerral Ralph. Hgb 8.3, platelets are elevated.    HL 0.1 - subtherapeutic on heparin drip @ 1600 units/hr. Heparin level drawn 7 hours late at 1200 (rather than 0500) and heparin infusion stopped around 0430 d/t patient losing IV access. IV access was regained at 1005. Therefore, repeat heparin level needed to make dosing adjustments of heparin.   9/22 PM update: HL <0.1 The IV infiltrated ~1600 resulting in a pause in the heparin infusion. IV team was contacted and a new IV line was placed ~20:30  Goal of Therapy:  Heparin level 0.3-0.7 units/ml aPTT 66-102 seconds Monitor platelets by anticoagulation protocol: Yes   Plan:  Restart heparin drip @ 1600 units/hr (21:00) Heparin level on 9/23 at 0500 Daily heparin level, CBC Monitor for s/sx of bleeding F/u resuming Eliquis post-procedure  Thank you for involving pharmacy in this patient's care.  Greta Doom BS, PharmD, BCPS Clinical Pharmacist 11/28/2022 9:00 PM  Contact: [phone]  after 3 PM  "Be curious, not judgmental..." -Debbora Dus

## 2022-11-28 NOTE — Progress Notes (Signed)
ANTICOAGULATION CONSULT NOTE - Follow Up Consult  Pharmacy Consult for Heparin Indication: atrial fibrillation (apixaban on hold)  Patient Measurements: Weight: 127.5 kg (281 lb 1.4 oz) Heparin Dosing Weight: 94 kg  Vital Signs: Temp: 97.7 F (36.5 C) (09/22 1154) Temp Source: Oral (09/22 0853) BP: 112/72 (09/22 1154) Pulse Rate: 98 (09/22 0400)  Labs: Recent Labs    11/26/22 0700 11/27/22 0453 11/27/22 1315 11/28/22 1206  HGB 7.8* 7.6*  --  8.3*  HCT 27.0* 26.1*  --  29.2*  PLT 488* 466*  --  541*  APTT 47* 58* 93*  --   HEPARINUNFRC 0.64 0.31 0.51 0.10*  CREATININE 4.06* 3.14*  --  3.88*    Estimated Creatinine Clearance: 21.6 mL/min (A) (by C-G formula based on SCr of 3.88 mg/dL (H)).  Assessment: 57 year old female to begin heparin while apixaban is on hold in anticipation of vascular procedure on Monday, 11/29/22.  Last dose of apixaban was 9/18 in AM. Ok to resume IV heparin per Dr Jerral Ralph. Hgb 8.3, platelets are elevated.    HL 0.1 - subtherapeutic on heparin drip @ 1600 units/hr. Heparin level drawn 7 hours late at 1200 (rather than 0500) and heparin infusion stopped around 0430 d/t patient losing IV access. IV access was regained at 1005. Therefore, repeat heparin level needed to make dosing adjustments of heparin.   Goal of Therapy:  Heparin level 0.3-0.7 units/ml aPTT 66-102 seconds Monitor platelets by anticoagulation protocol: Yes   Plan:  Continue heparin drip @ 1600 units/hr Repeat heparin level 8-hours from infusion restart  Daily heparin level, CBC Monitor for s/sx of bleeding F/u resuming Eliquis post-procedure  Thank you for involving pharmacy in this patient's care.  Roslyn Smiling, PharmD PGY1 Pharmacy Resident 11/28/2022 1:38 PM

## 2022-11-29 ENCOUNTER — Encounter (HOSPITAL_COMMUNITY)
Admission: EM | Disposition: A | Discharge status: Home Health | Payer: Self-pay | Source: Home / Self Care | Attending: Internal Medicine

## 2022-11-29 ENCOUNTER — Encounter (HOSPITAL_COMMUNITY): Payer: Self-pay | Admitting: Vascular Surgery

## 2022-11-29 DIAGNOSIS — A419 Sepsis, unspecified organism: Secondary | ICD-10-CM | POA: Diagnosis not present

## 2022-11-29 HISTORY — PX: ABDOMINAL AORTOGRAM W/LOWER EXTREMITY: CATH118223

## 2022-11-29 LAB — CBC
HCT: 25.2 % — ABNORMAL LOW (ref 36.0–46.0)
HCT: 29.9 % — ABNORMAL LOW (ref 36.0–46.0)
Hemoglobin: 7.3 g/dL — ABNORMAL LOW (ref 12.0–15.0)
Hemoglobin: 8.6 g/dL — ABNORMAL LOW (ref 12.0–15.0)
MCH: 26.8 pg (ref 26.0–34.0)
MCH: 26.8 pg (ref 26.0–34.0)
MCHC: 29 g/dL — ABNORMAL LOW (ref 30.0–36.0)
MCHC: 28.8 g/dL — ABNORMAL LOW (ref 30.0–36.0)
MCV: 92.6 fL (ref 80.0–100.0)
MCV: 93.1 fL (ref 80.0–100.0)
Platelets: 450 10*3/uL — ABNORMAL HIGH (ref 150–400)
Platelets: 537 10*3/uL — ABNORMAL HIGH (ref 150–400)
RBC: 2.72 MIL/uL — ABNORMAL LOW (ref 3.87–5.11)
RBC: 3.21 MIL/uL — ABNORMAL LOW (ref 3.87–5.11)
RDW: 16.1 % — ABNORMAL HIGH (ref 11.5–15.5)
RDW: 16.2 % — ABNORMAL HIGH (ref 11.5–15.5)
WBC: 20.3 10*3/uL — ABNORMAL HIGH (ref 4.0–10.5)
WBC: 19.6 10*3/uL — ABNORMAL HIGH (ref 4.0–10.5)
nRBC: 0 % (ref 0.0–0.2)
nRBC: 0 % (ref 0.0–0.2)

## 2022-11-29 LAB — GLUCOSE, CAPILLARY
Glucose-Capillary: 129 mg/dL — ABNORMAL HIGH (ref 70–99)
Glucose-Capillary: 146 mg/dL — ABNORMAL HIGH (ref 70–99)
Glucose-Capillary: 227 mg/dL — ABNORMAL HIGH (ref 70–99)
Glucose-Capillary: 203 mg/dL — ABNORMAL HIGH (ref 70–99)

## 2022-11-29 LAB — COMPREHENSIVE METABOLIC PANEL
ALT: 16 U/L (ref 0–44)
AST: 13 U/L — ABNORMAL LOW (ref 15–41)
Albumin: 2.3 g/dL — ABNORMAL LOW (ref 3.5–5.0)
Alkaline Phosphatase: 60 U/L (ref 38–126)
Anion gap: 11 (ref 5–15)
BUN: 30 mg/dL — ABNORMAL HIGH (ref 6–20)
CO2: 24 mmol/L (ref 22–32)
Calcium: 8.4 mg/dL — ABNORMAL LOW (ref 8.9–10.3)
Chloride: 97 mmol/L — ABNORMAL LOW (ref 98–111)
Creatinine, Ser: 3.81 mg/dL — ABNORMAL HIGH (ref 0.44–1.00)
GFR, Estimated: 13 mL/min — ABNORMAL LOW (ref >60)
Glucose, Bld: 159 mg/dL — ABNORMAL HIGH (ref 70–99)
Potassium: 3.7 mmol/L (ref 3.5–5.1)
Sodium: 132 mmol/L — ABNORMAL LOW (ref 135–145)
Total Bilirubin: 0.9 mg/dL (ref 0.3–1.2)
Total Protein: 6.4 g/dL — ABNORMAL LOW (ref 6.5–8.1)

## 2022-11-29 LAB — RENAL FUNCTION PANEL
Albumin: 2.8 g/dL — ABNORMAL LOW (ref 3.5–5.0)
Anion gap: 15 (ref 5–15)
BUN: 34 mg/dL — ABNORMAL HIGH (ref 6–20)
CO2: 20 mmol/L — ABNORMAL LOW (ref 22–32)
Calcium: 8.6 mg/dL — ABNORMAL LOW (ref 8.9–10.3)
Chloride: 97 mmol/L — ABNORMAL LOW (ref 98–111)
Creatinine, Ser: 3.89 mg/dL — ABNORMAL HIGH (ref 0.44–1.00)
GFR, Estimated: 13 mL/min — ABNORMAL LOW (ref >60)
Glucose, Bld: 228 mg/dL — ABNORMAL HIGH (ref 70–99)
Phosphorus: 3.8 mg/dL (ref 2.5–4.6)
Potassium: 4.4 mmol/L (ref 3.5–5.1)
Sodium: 132 mmol/L — ABNORMAL LOW (ref 135–145)

## 2022-11-29 LAB — MAGNESIUM: Magnesium: 2.3 mg/dL (ref 1.7–2.4)

## 2022-11-29 LAB — C-REACTIVE PROTEIN: CRP: 3.8 mg/dL — ABNORMAL HIGH (ref <1.0)

## 2022-11-29 LAB — HEPARIN LEVEL (UNFRACTIONATED): Heparin Unfractionated: 0.34 IU/mL (ref 0.30–0.70)

## 2022-11-29 LAB — PROCALCITONIN: Procalcitonin: 0.38 ng/mL

## 2022-11-29 SURGERY — Surgical Case
Anesthesia: *Unknown

## 2022-11-29 SURGERY — ABDOMINAL AORTOGRAM W/LOWER EXTREMITY
Anesthesia: LOCAL

## 2022-11-29 MED ORDER — METHYLPREDNISOLONE SODIUM SUCC 125 MG IJ SOLR
60.0000 mg | Freq: Once | INTRAMUSCULAR | Status: AC
  Administered 2022-11-29: 60 mg via INTRAVENOUS

## 2022-11-29 MED ORDER — SODIUM CHLORIDE 0.9% FLUSH: 3.0000 mL | INTRAVENOUS | Status: DC | PRN

## 2022-11-29 MED ORDER — HEPARIN (PORCINE) IN NACL 1000-0.9 UT/500ML-% IV SOLN
INTRAVENOUS | Status: DC | PRN
  Administered 2022-11-29 (×2): 500 mL

## 2022-11-29 MED ORDER — LIDOCAINE-PRILOCAINE 2.5-2.5 % EX CREA
1.0000 | TOPICAL_CREAM | CUTANEOUS | Status: DC | PRN
  Filled 2022-11-29: qty 5

## 2022-11-29 MED ORDER — DIPHENHYDRAMINE HCL 50 MG/ML IJ SOLN
25.0000 mg | Freq: Once | INTRAMUSCULAR | Status: AC
  Administered 2022-11-29: 25 mg via INTRAVENOUS

## 2022-11-29 MED ORDER — MIDAZOLAM HCL 2 MG/2ML IJ SOLN
INTRAMUSCULAR | Status: AC
  Filled 2022-11-29: qty 2

## 2022-11-29 MED ORDER — IODIXANOL 320 MG/ML IV SOLN
INTRAVENOUS | Status: DC | PRN
  Administered 2022-11-29: 50 mL via INTRA_ARTERIAL

## 2022-11-29 MED ORDER — HYDROMORPHONE HCL 1 MG/ML IJ SOLN
0.5000 mg | INTRAMUSCULAR | Status: DC | PRN
  Administered 2022-11-29: 0.5 mg via INTRAVENOUS

## 2022-11-29 MED ORDER — LIDOCAINE HCL (PF) 1 % IJ SOLN
INTRAMUSCULAR | Status: AC
  Filled 2022-11-29: qty 30

## 2022-11-29 MED ORDER — HEPARIN SODIUM (PORCINE) 1000 UNIT/ML DIALYSIS
1000.0000 [IU] | INTRAMUSCULAR | Status: DC | PRN
  Administered 2022-11-30: 1000 [IU]
  Filled 2022-11-29 (×3): qty 1

## 2022-11-29 MED ORDER — ONDANSETRON HCL 4 MG/2ML IJ SOLN: 4.0000 mg | Freq: Four times a day (QID) | INTRAMUSCULAR | Status: DC | PRN

## 2022-11-29 MED ORDER — HYDROMORPHONE HCL 1 MG/ML IJ SOLN
INTRAMUSCULAR | Status: AC
  Filled 2022-11-29: qty 0.5

## 2022-11-29 MED ORDER — ALTEPLASE 2 MG IJ SOLR
2.0000 mg | Freq: Once | INTRAMUSCULAR | Status: DC | PRN
  Filled 2022-11-29: qty 2

## 2022-11-29 MED ORDER — MIDAZOLAM HCL 2 MG/2ML IJ SOLN
INTRAMUSCULAR | Status: DC | PRN
  Administered 2022-11-29: 1 mg via INTRAVENOUS

## 2022-11-29 MED ORDER — LIDOCAINE HCL (PF) 1 % IJ SOLN
5.0000 mL | INTRAMUSCULAR | Status: DC | PRN
  Filled 2022-11-29: qty 5

## 2022-11-29 MED ORDER — SODIUM CHLORIDE 0.9 % IV SOLN: 250.0000 mL | INTRAVENOUS | Status: DC | PRN

## 2022-11-29 MED ORDER — ANTICOAGULANT SODIUM CITRATE 4% (200MG/5ML) IV SOLN
5.0000 mL | Status: DC | PRN
  Filled 2022-11-29: qty 5

## 2022-11-29 MED ORDER — ONDANSETRON HCL 4 MG/2ML IJ SOLN
INTRAMUSCULAR | Status: DC | PRN
  Administered 2022-11-29: 4 mg via INTRAVENOUS

## 2022-11-29 MED ORDER — SODIUM CHLORIDE 0.9% FLUSH
3.0000 mL | Freq: Two times a day (BID) | INTRAVENOUS | Status: DC
  Administered 2022-11-29 (×2): 3 mL via INTRAVENOUS

## 2022-11-29 MED ORDER — FENTANYL CITRATE (PF) 100 MCG/2ML IJ SOLN
INTRAMUSCULAR | Status: AC
  Filled 2022-11-29: qty 2

## 2022-11-29 MED ORDER — LIDOCAINE HCL (PF) 1 % IJ SOLN
INTRAMUSCULAR | Status: DC | PRN
  Administered 2022-11-29: 15 mL

## 2022-11-29 MED ORDER — HEPARIN SODIUM (PORCINE) 1000 UNIT/ML DIALYSIS
5000.0000 [IU] | Freq: Once | INTRAMUSCULAR | Status: DC
  Filled 2022-11-29: qty 5

## 2022-11-29 MED ORDER — METHYLPREDNISOLONE SODIUM SUCC 125 MG IJ SOLR
INTRAMUSCULAR | Status: AC
  Filled 2022-11-29: qty 2

## 2022-11-29 MED ORDER — FENTANYL CITRATE (PF) 100 MCG/2ML IJ SOLN
INTRAMUSCULAR | Status: DC | PRN
  Administered 2022-11-29: 50 ug via INTRAVENOUS

## 2022-11-29 MED ORDER — DIPHENHYDRAMINE HCL 50 MG/ML IJ SOLN
INTRAMUSCULAR | Status: AC
  Filled 2022-11-29: qty 1

## 2022-11-29 MED ORDER — PENTAFLUOROPROP-TETRAFLUOROETH EX AERO: 1.0000 | INHALATION_SPRAY | CUTANEOUS | Status: DC | PRN

## 2022-11-29 SURGICAL SUPPLY — 13 items
BAG SNAP BAND KOVER 36X36 (MISCELLANEOUS) ×1
CATH OMNI FLUSH 5F 65CM (CATHETERS) ×1
CLOSURE PERCLOSE PROSTYLE (VASCULAR PRODUCTS) ×1
COVER DOME SNAP 22 D (MISCELLANEOUS) ×2
DEVICE TORQUE .025-.038 (MISCELLANEOUS) ×1
GLIDEWIRE ANGLED SS 035X260CM (WIRE) ×1
KIT MICROPUNCTURE NIT STIFF (SHEATH) ×1
PROTECTION STATION PRESSURIZED (MISCELLANEOUS) ×1
SET ATX-X65L (MISCELLANEOUS) ×1
SHEATH PINNACLE 5F 10CM (SHEATH) ×1
SHEATH PROBE COVER 6X72 (BAG) ×1
TRAY PV CATH (CUSTOM PROCEDURE TRAY) ×1
WIRE BENTSON .035X145CM (WIRE) ×1

## 2022-11-29 NOTE — Progress Notes (Deleted)
[facility] Progress Note   Subjective: Seen in room. A little disoriented this am. No cp, sob. For arteriogram and dialysis today   Objective Vitals:   11/29/22 0200 11/29/22 0400 11/29/22 0520 11/29/22 0850  BP:   111/73 127/80  Pulse:    96  Resp: (!) 25 18 18 13   Temp:   97.9 F (36.6 C) 98 F (36.7 C)  TempSrc:   Oral Oral  SpO2:   100%   Weight:       Physical Exam General: Obese woman, nad Heart: Regular rate Lungs: Clear, normal wob Abdomen: soft, non-tender Extremities: Trace BLE edema. Drsgs on feet Dialysis Access: R TDC/LUE AVF   Additional Objective Labs: Basic Metabolic Panel: Recent Labs  Lab 11/23/22 1545 11/24/22 0513 11/25/22 0609 11/26/22 0700 11/27/22 0453 11/28/22 1206 11/29/22 0542  NA  --    < > 131*   < > 132* 135 132*  K  --    < > 3.1*   < > 3.3* 3.1* 3.7  CL  --    < > 95*   < > 97* 95* 97*  CO2  --    < > 25   < > 18* 25 24  GLUCOSE  --    < > 76   < > 132* 119* 159*  BUN  --    < > 18   < > 17 23* 30*  CREATININE  --    < > 3.54*   < > 3.14* 3.88* 3.81*  CALCIUM  --    < > 7.6*   < > 8.1* 8.4* 8.4*  PHOS 8.4*  --  3.4  --  4.1  --   --    < > = values in this interval not displayed.   Liver Function Tests: Recent Labs  Lab 11/25/22 0609 11/27/22 0453 11/28/22 1206 11/29/22 0542  AST 41  --  24 13*  ALT 16  --  18 16  ALKPHOS 59  --  68 60  BILITOT 1.1  --  1.1 0.9  PROT 6.6  --  7.4 6.4*  ALBUMIN 1.9*  1.9* 2.1* 2.5* 2.3*   No results for input(s): "LIPASE", "AMYLASE" in the last 168 hours. CBC: Recent Labs  Lab 11/23/22 0023 11/23/22 0218 11/24/22 0513 11/24/22 2250 11/25/22 0609 11/26/22 0700 11/27/22 0453 11/28/22 1206 11/29/22 0542  WBC 30.3*  --  19.8*  --  19.1* 17.1* 20.5* 15.8* 20.3*  NEUTROABS 27.1*  --  17.0*  --  16.4*  --   --   --   --   HGB 7.7*   < > 6.9*   < > 7.6* 7.8* 7.6* 8.3* 7.3*  HCT 26.6*   < > 23.4*   < > 26.8* 27.0* 26.1* 29.2* 25.2*  MCV 91.7  --  90.7  --  90.8  93.8 94.2 92.1 92.6  PLT 345  --  340  --  429* 488* 466* 541* 450*   < > = values in this interval not displayed.   Blood Culture    Component Value Date/Time   SDES BLOOD SITE NOT SPECIFIED 11/23/2022 0023   SPECREQUEST  11/23/2022 0023    BOTTLES DRAWN AEROBIC AND ANAEROBIC Blood Culture adequate volume   CULT  11/23/2022 0023    NO GROWTH 5 DAYS Performed at [facility], [address] [place], [place] [address]    REPTSTATUS 11/28/2022 FINAL 11/23/2022 0023    Cardiac Enzymes: No results  for input(s): "CKTOTAL", "CKMB", "CKMBINDEX", "TROPONINI" in the last 168 hours. CBG: Recent Labs  Lab 11/27/22 2144 11/28/22 0858 11/28/22 1237 11/28/22 1541 11/29/22 0852  GLUCAP 124* 100* 123* 159* 129*   Iron Studies: No results for input(s): "IRON", "TIBC", "TRANSFERRIN", "FERRITIN" in the last 72 hours. @lablastinr3 @ Studies/Results: ABORTED INVASIVE LAB PROCEDURE  Result Date: 11/29/2022 This case was aborted.  Medications:  heparin 1,600 Units/hr (11/29/22 0436)    doxercalciferol  4 mcg Intravenous Q M,W,F-HD   DULoxetine  40 mg Oral Daily   ferric citrate  420 mg Oral TID WC   insulin aspart  0-15 Units Subcutaneous TID WC   insulin glargine-yfgn  30 Units Subcutaneous Daily   leptospermum manuka honey  1 Application Topical Daily   methylPREDNISolone (SOLU-MEDROL) injection  60 mg Intravenous Q24H   midodrine  5 mg Oral TID WC   mirtazapine  15 mg Oral QHS   pantoprazole  40 mg Oral Daily   sodium chloride flush  10-40 mL Intracatheter Q12H   sodium chloride flush  3 mL Intravenous Q12H     06/30/2022 -- > Dr Chestine Spore of [facility] did L BC AVF placement     OP HD: [facility] MWF  4h  400/800    126.9kg  RIJ TDC/  LUA AVF (maturing)  - Heparin 5000 units IV initial bolus.Heparin 2500 units IV midrun - hectorol 7 mcg IV tiw - venofer 50mg  IV weekly (Hold while getting ABX) - mircera 225 mcg IV q 2 wks     Assessment/ Plan: Sepsis/ SSTI - initially concern for R  ankle osteomyelitis.  Responding to IV abx and IV fluids. Blood cx neg to date.  PAD/R ankle ulcer - [facility] following. Was scheduled for angiogram Friday but unable to obtain IV access. Now scheduled for today 9/23.   H/O metastatic carcinoid tumor ESRD - on HD MWF.Back on schedule. Next HD 9/23 Work around vascular schedule  H/o HTN - BP's soft, antihypertensive meds on hold. Opioids probably contributing to hypotension.  Volume - Improved post HD. Net UF 2 liters with HD 9/21 Anemia esrd - Hb 7s s/p 1 Unit PRBCs 9/18. Next  esa is due on 9/27. Follow HGB. Transfuse if HGB < 7.0.  MBD ckd - CCa in range.PO4 at goal. Cont Auryxia binder Hyponatremia - Improving with volume removal. Follow labs.  H/o afib H/o chronic pain Deconditioning - pt is WC to chair/ bed dependent  Tomasa Blase PA-C [facility] 11/29/2022,9:36 AM

## 2022-11-29 NOTE — Progress Notes (Signed)
  Daily Progress Note   Subjective: Nervous about procedure  Patient with right foot ulcer and PAD. Planned for arteriogram today.   Objective: Vitals:   11/29/22 0520 11/29/22 0850  BP: 111/73 127/80  Pulse:  96  Resp: 18 13  Temp: 97.9 F (36.6 C) 98 F (36.7 C)  SpO2: 100%     Physical Examination General:  no distress CV: HDS Lungs:  non labored breathing  ABI/TBI 11/24/2022: Right:  0.82/0.57 great toe pressure:  64 Left:  0.81/0.50 great toe pressure 57   ASSESSMENT/PLAN:  57 y.o. female with right heel ulcer   -MRI negative for acute osteomyelitis. -plan for arteriogram today -risks and benefits reviewed and willing to proceed.    Daria Pastures MD MS [facility] [phone] 11/29/2022  8:55 AM

## 2022-11-29 NOTE — Progress Notes (Signed)
[facility] Progress Note   Subjective: Seen in room this am.  A little disoriented this am. No cp, sob. For arteriogram and dialysis today   Objective Vitals:   11/29/22 1200 11/29/22 1205 11/29/22 1500 11/29/22 1600  BP: 138/65 129/63 (!) 147/105 130/80  Pulse: 92 94 (!) 104 99  Resp: 14 18 14  (!) 26  Temp:      TempSrc:      SpO2: 100% 100% 100% 100%  Weight:       Physical Exam General: Obese woman, nad Heart: Regular rate Lungs: Clear, normal wob Abdomen: soft, non-tender Extremities: Trace BLE edema. Drsgs on feet Dialysis Access: R TDC/LUE AVF   Additional Objective Labs: Basic Metabolic Panel: Recent Labs  Lab 11/23/22 1545 11/24/22 0513 11/25/22 0609 11/26/22 0700 11/27/22 0453 11/28/22 1206 11/29/22 0542  NA  --    < > 131*   < > 132* 135 132*  K  --    < > 3.1*   < > 3.3* 3.1* 3.7  CL  --    < > 95*   < > 97* 95* 97*  CO2  --    < > 25   < > 18* 25 24  GLUCOSE  --    < > 76   < > 132* 119* 159*  BUN  --    < > 18   < > 17 23* 30*  CREATININE  --    < > 3.54*   < > 3.14* 3.88* 3.81*  CALCIUM  --    < > 7.6*   < > 8.1* 8.4* 8.4*  PHOS 8.4*  --  3.4  --  4.1  --   --    < > = values in this interval not displayed.   Liver Function Tests: Recent Labs  Lab 11/25/22 0609 11/27/22 0453 11/28/22 1206 11/29/22 0542  AST 41  --  24 13*  ALT 16  --  18 16  ALKPHOS 59  --  68 60  BILITOT 1.1  --  1.1 0.9  PROT 6.6  --  7.4 6.4*  ALBUMIN 1.9*  1.9* 2.1* 2.5* 2.3*   No results for input(s): "LIPASE", "AMYLASE" in the last 168 hours. CBC: Recent Labs  Lab 11/23/22 0023 11/23/22 0218 11/24/22 0513 11/24/22 2250 11/25/22 0609 11/26/22 0700 11/27/22 0453 11/28/22 1206 11/29/22 0542  WBC 30.3*  --  19.8*  --  19.1* 17.1* 20.5* 15.8* 20.3*  NEUTROABS 27.1*  --  17.0*  --  16.4*  --   --   --   --   HGB 7.7*   < > 6.9*   < > 7.6* 7.8* 7.6* 8.3* 7.3*  HCT 26.6*   < > 23.4*   < > 26.8* 27.0* 26.1* 29.2* 25.2*  MCV 91.7  --  90.7  --   90.8 93.8 94.2 92.1 92.6  PLT 345  --  340  --  429* 488* 466* 541* 450*   < > = values in this interval not displayed.   Blood Culture    Component Value Date/Time   SDES BLOOD SITE NOT SPECIFIED 11/23/2022 0023   SPECREQUEST  11/23/2022 0023    BOTTLES DRAWN AEROBIC AND ANAEROBIC Blood Culture adequate volume   CULT  11/23/2022 0023    NO GROWTH 5 DAYS Performed at [facility], [address], [place], [place] [address]    REPTSTATUS 11/28/2022 FINAL 11/23/2022 0023    Cardiac Enzymes: No results for  input(s): "CKTOTAL", "CKMB", "CKMBINDEX", "TROPONINI" in the last 168 hours. CBG: Recent Labs  Lab 11/28/22 0858 11/28/22 1237 11/28/22 1541 11/29/22 0852 11/29/22 1229  GLUCAP 100* 123* 159* 129* 146*   Iron Studies: No results for input(s): "IRON", "TIBC", "TRANSFERRIN", "FERRITIN" in the last 72 hours. @lablastinr3 @ Studies/Results: PERIPHERAL VASCULAR CATHETERIZATION  Result Date: 11/29/2022 Patient name: Deborah [name]  MRN: [id number]        DOB: 20-Jan-1966          Sex: female  11/29/2022 Pre-operative Diagnosis: Peripheral arterial disease with bilateral heel wounds Post-operative diagnosis:  Same Surgeon:  Daria Pastures, MD Procedure Performed: 1.  Ultrasound-guided access of left common femoral artery 2.  Abdominal aortogram 3.  Second order cannulation of the right external iliac artery 4.  Right lower extremity angiogram 5.  Left lower extremity angiogram via sheath 6.  Pro-glide closure of left common femoral artery 7.  28-minute moderate sedation   Indications: Ms. Ferreiro is a 57 year old female with history of ESRD, A-fib on Eliquis, hypertension, HFpEF, type 2 diabetes who presented with weakness fatigue and multiple episodes of nausea/vomiting's been being treated for sepsis and a possible source of a right heel wound.  ABIs were obtained and demonstrated 0.8 bilaterally with toe pressures in the 60s which are borderline for wound healing.  Risks and  benefits of angiogram were reviewed and she was willing to proceed.  Findings: Widely patent aorta, bilateral iliac systems, bilateral common femorals, bilateral SFA and popliteals with three-vessel runoff.  There did appear to be good perfusion to the right heel wound.             Procedure:  The patient was identified in the holding area and taken to room 8.  The patient was then placed supine on the table and prepped and draped in the usual sterile fashion.  A time out was called.  Ultrasound was used to evaluate the left common femoral artery.  It was patent .  A digital ultrasound image was acquired.  A micropuncture needle was used to access the left common femoral artery under ultrasound guidance.  An 018 wire was advanced without resistance and a micropuncture sheath was placed.  The 018 wire was removed and a benson wire was placed.  The micropuncture sheath was exchanged for a 5 french sheath.  An omniflush catheter was advanced over the wire to the level of L-1.  An abdominal angiogram was obtained.  Next, using the omniflush catheter and a stiff angled Glidewire, the aortic bifurcation was crossed and the catheter was placed into the right external iliac artery and right runoff was obtained.  left runoff was performed via retrograde sheath injections.  A Pro-glide Perclose closure device was then used to close the arteriotomy of the left common femoral artery.   ABORTED INVASIVE LAB PROCEDURE  Result Date: 11/29/2022 This case was aborted.  Medications:  sodium chloride     heparin Stopped (11/29/22 1121)    [MAR Hold] doxercalciferol  4 mcg Intravenous Q M,W,F-HD   [MAR Hold] DULoxetine  40 mg Oral Daily   [MAR Hold] ferric citrate  420 mg Oral TID WC   HYDROmorphone       [MAR Hold] insulin aspart  0-15 Units Subcutaneous TID WC   [MAR Hold] insulin glargine-yfgn  30 Units Subcutaneous Daily   [MAR Hold] leptospermum manuka honey  1 Application Topical Daily   [MAR Hold]  methylPREDNISolone (SOLU-MEDROL) injection  60 mg Intravenous Q24H   [MAR Hold]  midodrine  5 mg Oral TID WC   [MAR Hold] mirtazapine  15 mg Oral QHS   [MAR Hold] pantoprazole  40 mg Oral Daily   [MAR Hold] sodium chloride flush  10-40 mL Intracatheter Q12H   [MAR Hold] sodium chloride flush  3 mL Intravenous Q12H   sodium chloride flush  3 mL Intravenous Q12H     06/30/2022 -- > Dr Chestine Spore of [facility] did L BC AVF placement     OP HD: [facility] MWF  4h  400/800    126.9kg  RIJ TDC/  LUA AVF (maturing)  - Heparin 5000 units IV initial bolus.Heparin 2500 units IV midrun - hectorol 7 mcg IV tiw - venofer 50mg  IV weekly (Hold while getting ABX) - mircera 225 mcg IV q 2 wks     Assessment/ Plan: Sepsis/ SSTI - initially concern for R ankle osteomyelitis.  Responding to IV abx and IV fluids. Blood cx neg to date.  PAD/R ankle ulcer - [facility] following. Was scheduled for angiogram Friday but unable to obtain IV access. Now scheduled for today 9/23.   H/O metastatic carcinoid tumor ESRD - on HD MWF.Back on schedule. Next HD 9/23 Work around vascular schedule  H/o HTN - BP's soft, antihypertensive meds on hold. Opioids probably contributing to hypotension.  Volume - Improved post HD. Net UF 2 liters with HD 9/21 Anemia esrd - Hb 7s s/p 1 Unit PRBCs 9/18. Next  esa is due on 9/27. Follow HGB. Transfuse if HGB < 7.0.  MBD ckd - CCa in range.PO4 at goal. Cont Auryxia binder Hyponatremia - Improving with volume removal. Follow labs.  H/o afib H/o chronic pain Deconditioning - pt is WC to chair/ bed dependent  Tomasa Blase PA-C [facility] 11/29/2022,4:27 PM

## 2022-11-29 NOTE — Progress Notes (Signed)
PT Cancellation Note  Patient Details Name: SHAVELLE TEP MRN: [id number] DOB: 10-05-1965   Cancelled Treatment:    Reason Eval/Treat Not Completed: Patient at procedure or test/unavailable  Attempted to see x 2 this morning with pt unavailable each time. Will continue attempts.    Jerolyn Center, PT Acute Rehabilitation Services  Office [phone]  Zena Amos 11/29/2022, 12:06 PM

## 2022-11-29 NOTE — Progress Notes (Signed)
PROGRESS NOTE        PATIENT DETAILS Name: Deborah [name] Age: 57 y.o. Sex: female Date of Birth: October 05, 1965 Admit Date: 11/22/2022 Admitting Physician Dolly Rias, MD PCP:Pcp, No  Brief Summary: Patient is a 57 y.o.  female with history of ESRD, PAF on Eliquis, HFpEF, HTN, DM-2, malignant carcinoid, NAFLD-who presented with weakness/fatigue several episodes of nausea/vomiting-found to have sepsis-likely due to soft tissue infection involving right ankle.  See below for further details.  Significant events: 9/16>> admit to [facility]  Significant studies: 9/17>> CXR: No PNA 9/17>> RUQ ultrasound: No acute abnormality 9/18>> MRI right foot: No acute osteomyelitis.  Significant microbiology data: 9/17>> COVID/influenza/RSV PCR: Negative 9/17>> blood culture: No growth  Procedures: None  Consults: [facility]  Subjective:   Patient in bed sleeping comfortably, wakes up to tells me that she is feeling at least 50% better, lower extremities are numb but pain is much improved, no chest pain or shortness of breath  Objective: Vitals: Blood pressure 111/73, pulse 93, temperature 97.9 F (36.6 C), temperature source Oral, resp. rate 18, weight 127.5 kg, last menstrual period 10/10/2012, SpO2 100%.   Exam:  Awake Alert, No new F.N deficits, Normal affect [place].AT,PERRAL Supple Neck, No JVD,   Symmetrical Chest wall movement, Good air movement bilaterally, CTAB RRR,No Gallops, Rubs or new Murmurs,  +ve B.Sounds, Abd Soft, No tenderness,     Right foot pictures below, right IJ HD catheter,        Assessment/Plan: Sepsis Unclear etiology-initially thought to have osteomyelitis of right ankle but MRI is negative-wound is chronic and does not appear overtly infected.  No other source of infection apparent-her HD site is clean.  Does have some abdominal pain but it is mostly chronic and CT of the abdomen was negative. All cultures  negative so far- Continues to have persistent leukocytosis but appears to have some amount of chronic leukocytosis at baseline (probably from chronic inflammation in right heel) ID Dr. Thedore Mins was consulted by previous MD on 11/27/2022 over the phone, recommendations to hold antibiotics and monitor clinically.  Antibiotics stopped afternoon of 11/27/2022.    Recent history of disseminated gout.  Currently has generalized body aches and pains.  History of disseminated gout few months ago.  That admission she showed remarkable response to IV steroids, initiated IV steroids on 11/28/2022 and monitor, symmetric markers coming down, continue steroids.   Chronic right heel ulcer No evidence of osteomyelitis on MRI Vascular surgery had planned angiogram on 9/23-  Dr. Rodney Booze surgery following  ESRD on HD Nephrology following and directing care  Normocytic anemia Multifactorial-chronic anemia due to ESRD/CKD-worsened by anemia due to acute illness No evidence of GI bleeding/blood loss CBC stable-s/p 1 unit of PRBC on 9/18. Follow CBC periodically.    Minimally elevated troponin Demand ischemia No chest pain/anginal symptoms Supportive care  Transaminitis Minimal  Has resolved with just supportive care.  Hyponatremia Mild-asymptomatic Should improve with volume removal with HD  Chronic HFpEF Some leg edema but otherwise stable Volume removal with HD  PAF Telemetry monitoring Eliquis held in anticipation of angiogram-on IV heparin  HTN All antihypertensives held-as blood pressure remains soft due to sepsis physiology  History of duodenal carcinoma/carcinoid-s/p resection 2021 Chronic pain syndrome Narcotic dependence Supportive care Per nursing staff-has been repeatedly asking for IV Dilaudid-exhibiting some pain seeking behavior.  Continue narcotics at home  dose.  DM-2 (A1c 8.19/13) CBG relatively stable-currently n.p.o. Continue Semglee 30 units daily with  SSI Follow/adjust.  Recent Labs    11/28/22 0858 11/28/22 1237 11/28/22 1541  GLUCAP 100* 123* 159*     Mood disorder Relatively stable Cymbalta/Remeron  Right IJ HD catheter.     Debility/deconditioning Mostly wheelchair-bound-able to take a few steps to go to the bathroom etc. PT/OT  Morbid Obesity: Estimated body mass index is 45.37 kg/m as calculated from the following:   Height as of 11/18/22: 5\' 6"  (1.676 m).   Weight as of this encounter: 127.5 kg.   Pressure ulcer Agree with assessment and plan as outlined below.  Pressure Injury 11/23/22 Ischial tuberosity Left Unstageable - Full thickness tissue loss in which the base of the injury is covered by slough (yellow, tan, gray, green or brown) and/or eschar (tan, brown or black) in the wound bed. (Active)  11/23/22 0853  Location: Ischial tuberosity  Location Orientation: Left  Staging: Unstageable - Full thickness tissue loss in which the base of the injury is covered by slough (yellow, tan, gray, green or brown) and/or eschar (tan, brown or black) in the wound bed.  Wound Description (Comments):   Present on Admission: Yes     Pressure Injury 11/23/22 Heel Right Unstageable - Full thickness tissue loss in which the base of the injury is covered by slough (yellow, tan, gray, green or brown) and/or eschar (tan, brown or black) in the wound bed. (Active)  11/23/22 0854  Location: Heel  Location Orientation: Right  Staging: Unstageable - Full thickness tissue loss in which the base of the injury is covered by slough (yellow, tan, gray, green or brown) and/or eschar (tan, brown or black) in the wound bed.  Wound Description (Comments):   Present on Admission: Yes     Pressure Injury 11/24/22 Thigh Proximal;Right;Medial;Upper Stage 2 -  Partial thickness loss of dermis presenting as a shallow open injury with a red, pink wound bed without slough. stage two injury to upper thigh into groin region. (Active)  11/24/22 1452   Location: Thigh  Location Orientation: Proximal;Right;Medial;Upper  Staging: Stage 2 -  Partial thickness loss of dermis presenting as a shallow open injury with a red, pink wound bed without slough.  Wound Description (Comments): stage two injury to upper thigh into groin region.  Present on Admission: Yes      Code status:   Code Status: Full Code   DVT Prophylaxis: IV heparin    Family Communication: None at bedside   Disposition Plan: Status is: Inpatient Remains inpatient appropriate because: Severity of illness   Planned Discharge Destination:Home   Diet: Diet Order             Diet NPO time specified Except for: Sips with Meds  Diet effective midnight                   MEDICATIONS: Scheduled Meds:  doxercalciferol  4 mcg Intravenous Q M,W,F-HD   DULoxetine  40 mg Oral Daily   ferric citrate  420 mg Oral TID WC   insulin aspart  0-15 Units Subcutaneous TID WC   insulin glargine-yfgn  30 Units Subcutaneous Daily   leptospermum manuka honey  1 Application Topical Daily   methylPREDNISolone (SOLU-MEDROL) injection  60 mg Intravenous Q24H   metroNIDAZOLE  500 mg Oral Q8H   midodrine  5 mg Oral TID WC   mirtazapine  15 mg Oral QHS   pantoprazole  40 mg Oral Daily  sodium chloride flush  10-40 mL Intracatheter Q12H   sodium chloride flush  3 mL Intravenous Q12H   Continuous Infusions:  heparin 1,600 Units/hr (11/29/22 0436)   vancomycin Stopped (11/24/22 2115)   PRN Meds:.albuterol, fentaNYL (SUBLIMAZE) injection, HYDROmorphone **OR** HYDROmorphone, loperamide, ondansetron (ZOFRAN) IV, sodium chloride flush   I have personally reviewed following labs and imaging studies  LABORATORY DATA:  Recent Labs  Lab 11/23/22 0023 11/23/22 0218 11/24/22 0513 11/24/22 2250 11/25/22 0609 11/26/22 0700 11/27/22 0453 11/28/22 1206 11/29/22 0542  WBC 30.3*  --  19.8*  --  19.1* 17.1* 20.5* 15.8* 20.3*  HGB 7.7*   < > 6.9*   < > 7.6* 7.8* 7.6* 8.3* 7.3*   HCT 26.6*   < > 23.4*   < > 26.8* 27.0* 26.1* 29.2* 25.2*  PLT 345  --  340  --  429* 488* 466* 541* 450*  MCV 91.7  --  90.7  --  90.8 93.8 94.2 92.1 92.6  MCH 26.6  --  26.7  --  25.8* 27.1 27.4 26.2 26.8  MCHC 28.9*  --  29.5*  --  28.4* 28.9* 29.1* 28.4* 29.0*  RDW 16.7*  --  16.5*  --  16.4* 16.5* 16.4* 16.3* 16.1*  LYMPHSABS 1.8  --  1.3  --  1.3  --   --   --   --   MONOABS 0.7  --  0.7  --  0.8  --   --   --   --   EOSABS 0.1  --  0.4  --  0.3  --   --   --   --   BASOSABS 0.1  --  0.1  --  0.1  --   --   --   --    < > = values in this interval not displayed.    Recent Labs  Lab 11/23/22 0023 11/23/22 0035 11/23/22 0217 11/23/22 0218 11/23/22 0515 11/23/22 0914 11/24/22 0513 11/25/22 0609 11/26/22 0700 11/27/22 0453 11/28/22 1206 11/29/22 0542  NA 127*  --   --    < >  --   --  131* 131* 132* 132* 135 132*  K 4.0  --   --    < >  --   --  4.1 3.1* 3.0* 3.3* 3.1* 3.7  CL 86*  --   --   --   --   --  95* 95* 97* 97* 95* 97*  CO2 16*  --   --   --   --   --  25 25 23  18* 25 24  ANIONGAP 25*  --   --   --   --   --  11 11 12  17* 15 11  GLUCOSE 270*  --   --   --   --   --  87 76 74 132* 119* 159*  BUN 68*  --   --   --   --   --  29* 18 22* 17 23* 30*  CREATININE 10.14*  --   --   --   --   --  5.01* 3.54* 4.06* 3.14* 3.88* 3.81*  AST 47*  --   --   --   --   --  46* 41  --   --  24 13*  ALT 15  --   --   --   --   --  15 16  --   --  18 16  ALKPHOS 86  --   --   --   --   --  54 59  --   --  68 60  BILITOT 1.3*  --   --   --   --   --  1.3* 1.1  --   --  1.1 0.9  ALBUMIN 2.2*  --   --   --   --   --  1.8* 1.9*  1.9*  --  2.1* 2.5* 2.3*  CRP  --   --   --   --   --   --  21.1*  --   --   --  5.5* 3.8*  PROCALCITON  --   --   --   --   --   --   --   --   --   --  0.53 0.38  LATICACIDVEN  --  4.4* 3.8*  --  1.7 1.0  --   --   --   --   --   --   INR 1.8*  --   --   --   --   --   --   --   --   --   --   --   BNP 120.4*  --   --   --   --   --   --   --   --   --    --   --   MG  --   --   --   --   --   --   --   --   --   --  1.5* 2.3  CALCIUM 9.3  --   --   --   --   --  7.9* 7.6* 7.7* 8.1* 8.4* 8.4*   < > = values in this interval not displayed.     Recent Labs  Lab 11/23/22 0023 11/23/22 0035 11/23/22 0217 11/23/22 0515 11/23/22 0914 11/24/22 0513 11/25/22 0609 11/26/22 0700 11/27/22 0453 11/28/22 1206 11/29/22 0542  CRP  --   --   --   --   --  21.1*  --   --   --  5.5* 3.8*  PROCALCITON  --   --   --   --   --   --   --   --   --  0.53 0.38  LATICACIDVEN  --  4.4* 3.8* 1.7 1.0  --   --   --   --   --   --   INR 1.8*  --   --   --   --   --   --   --   --   --   --   BNP 120.4*  --   --   --   --   --   --   --   --   --   --   MG  --   --   --   --   --   --   --   --   --  1.5* 2.3  CALCIUM 9.3  --   --   --   --  7.9* 7.6* 7.7* 8.1* 8.4* 8.4*      RADIOLOGY STUDIES/RESULTS: ABORTED INVASIVE LAB PROCEDURE  Result Date: 11/29/2022 This case was aborted.    LOS: 6 days    Signature  -    Susa Raring M.D on 11/29/2022 at 8:45 AM   -  To page go to [email or web address]

## 2022-11-29 NOTE — Op Note (Addendum)
Patient name: Deborah [name] MRN: [id number] DOB: 01-22-1966 Sex: female  11/29/2022 Pre-operative Diagnosis: Peripheral arterial disease with bilateral heel wounds Post-operative diagnosis:  Same Surgeon:  Daria Pastures, MD Procedure Performed: 1.  Ultrasound-guided access of left common femoral artery 2.  Abdominal aortogram 3.  Second order cannulation of the right external iliac artery  4.  Right lower extremity angiogram 5.  Left lower extremity angiogram via sheath 6.  Pro-glide closure of left common femoral artery 7.  28-minute moderate sedation   Indications: [name] is a 57 year old female with history of ESRD, A-fib on Eliquis, hypertension, HFpEF, type 2 diabetes who presented with weakness fatigue and multiple episodes of nausea/vomiting's been being treated for sepsis and a possible source of a right heel wound.  ABIs were obtained and demonstrated 0.8 bilaterally with toe pressures in the 60s which are borderline for wound healing.  Risks and benefits of angiogram were reviewed and she was willing to proceed.  It should be noted that she does have a contrast allergy and was premedicated with Solu-Medrol and Benadryl.  Findings: Widely patent aorta, bilateral iliac systems, bilateral common femorals, bilateral SFA and popliteals with three-vessel runoff.  There did appear to be good perfusion to the right heel wound.   Procedure:  The patient was identified in the holding area and taken to room 8.  The patient was then placed supine on the table and prepped and draped in the usual sterile fashion.  A time out was called.  Ultrasound was used to evaluate the left common femoral artery.  It was patent .  A digital ultrasound image was acquired.  A micropuncture needle was used to access the left common femoral artery under ultrasound guidance.  An 018 wire was advanced without resistance and a micropuncture sheath was placed.  The 018 wire was removed and a benson  wire was placed.  The micropuncture sheath was exchanged for a 5 french sheath.  An omniflush catheter was advanced over the wire to the level of L-1.  An abdominal angiogram was obtained.  Next, using the omniflush catheter and a stiff angled Glidewire, the aortic bifurcation was crossed and the catheter was placed into the right external iliac artery and right runoff was obtained.  left runoff was performed via retrograde sheath injections. A Pro-glide Perclose closure device was then used to close the arteriotomy of the left common femoral artery.  Contrast volume: 50 cc   Daria Pastures MD [facility] Office: [phone]

## 2022-11-29 NOTE — Progress Notes (Signed)
ANTICOAGULATION CONSULT NOTE - Follow Up Consult  Pharmacy Consult for Heparin Indication: atrial fibrillation (apixaban on hold)  Patient Measurements: Weight: 127.5 kg (281 lb 1.4 oz) Heparin Dosing Weight: 94 kg  Vital Signs: Temp: 97.9 F (36.6 C) (09/23 0520) Temp Source: Oral (09/23 0520) BP: 111/73 (09/23 0520)  Labs: Recent Labs    11/26/22 0700 11/27/22 0453 11/27/22 1315 11/28/22 1206 11/28/22 1807 11/29/22 0542  HGB 7.8* 7.6*  --  8.3*  --  7.3*  HCT 27.0* 26.1*  --  29.2*  --  25.2*  PLT 488* 466*  --  541*  --  450*  APTT 47* 58* 93*  --   --   --   HEPARINUNFRC 0.64 0.31 0.51 0.10* <0.10* 0.34  CREATININE 4.06* 3.14*  --  3.88*  --   --     Estimated Creatinine Clearance: 21.6 mL/min (A) (by C-G formula based on SCr of 3.88 mg/dL (H)).  Assessment: 57 year old female to begin heparin while apixaban is on hold in anticipation of vascular procedure on Monday, 11/29/22.  Last dose of apixaban was 9/18 in AM. Ok to resume IV heparin per Dr Jerral Ralph.  HL 0.34 on current rate - therapeutic  Goal of Therapy:  Heparin level 0.3-0.7 units/ml aPTT 66-102 seconds Monitor platelets by anticoagulation protocol: Yes   Plan:  Continue heparin drip @ 1600 units/hr Daily heparin level, CBC Monitor for s/sx of bleeding F/u resuming Eliquis post-procedure  Thank you for involving pharmacy in this patient's care.  Calton Dach, PharmD, BCCCP Clinical Pharmacist 11/29/2022 6:37 AM

## 2022-11-29 NOTE — Progress Notes (Signed)
Received report from cath lab.

## 2022-11-30 ENCOUNTER — Encounter (HOSPITAL_COMMUNITY): Payer: Self-pay | Admitting: Vascular Surgery

## 2022-11-30 DIAGNOSIS — N186 End stage renal disease: Secondary | ICD-10-CM | POA: Diagnosis not present

## 2022-11-30 DIAGNOSIS — E1143 Type 2 diabetes mellitus with diabetic autonomic (poly)neuropathy: Secondary | ICD-10-CM

## 2022-11-30 DIAGNOSIS — D649 Anemia, unspecified: Secondary | ICD-10-CM

## 2022-11-30 DIAGNOSIS — L89619 Pressure ulcer of right heel, unspecified stage: Secondary | ICD-10-CM | POA: Diagnosis not present

## 2022-11-30 DIAGNOSIS — K219 Gastro-esophageal reflux disease without esophagitis: Secondary | ICD-10-CM

## 2022-11-30 DIAGNOSIS — L8990 Pressure ulcer of unspecified site, unspecified stage: Secondary | ICD-10-CM | POA: Insufficient documentation

## 2022-11-30 DIAGNOSIS — E871 Hypo-osmolality and hyponatremia: Secondary | ICD-10-CM

## 2022-11-30 DIAGNOSIS — K3184 Gastroparesis: Secondary | ICD-10-CM

## 2022-11-30 DIAGNOSIS — I5032 Chronic diastolic (congestive) heart failure: Secondary | ICD-10-CM

## 2022-11-30 DIAGNOSIS — F32A Depression, unspecified: Secondary | ICD-10-CM | POA: Diagnosis not present

## 2022-11-30 DIAGNOSIS — M1A9XX0 Chronic gout, unspecified, without tophus (tophi): Secondary | ICD-10-CM

## 2022-11-30 DIAGNOSIS — Z992 Dependence on renal dialysis: Secondary | ICD-10-CM

## 2022-11-30 DIAGNOSIS — I959 Hypotension, unspecified: Secondary | ICD-10-CM

## 2022-11-30 LAB — PROCALCITONIN: Procalcitonin: 0.32 ng/mL

## 2022-11-30 LAB — CBC
HCT: 26.2 % — ABNORMAL LOW (ref 36.0–46.0)
Hemoglobin: 7.5 g/dL — ABNORMAL LOW (ref 12.0–15.0)
MCH: 27 pg (ref 26.0–34.0)
MCHC: 28.6 g/dL — ABNORMAL LOW (ref 30.0–36.0)
MCV: 94.2 fL (ref 80.0–100.0)
Platelets: 479 10*3/uL — ABNORMAL HIGH (ref 150–400)
RBC: 2.78 MIL/uL — ABNORMAL LOW (ref 3.87–5.11)
RDW: 16.2 % — ABNORMAL HIGH (ref 11.5–15.5)
WBC: 21.8 10*3/uL — ABNORMAL HIGH (ref 4.0–10.5)
nRBC: 0 % (ref 0.0–0.2)

## 2022-11-30 LAB — COMPREHENSIVE METABOLIC PANEL
ALT: 15 U/L (ref 0–44)
AST: 16 U/L (ref 15–41)
Albumin: 2.7 g/dL — ABNORMAL LOW (ref 3.5–5.0)
Alkaline Phosphatase: 72 U/L (ref 38–126)
Anion gap: 12 (ref 5–15)
BUN: 15 mg/dL (ref 6–20)
CO2: 25 mmol/L (ref 22–32)
Calcium: 8.4 mg/dL — ABNORMAL LOW (ref 8.9–10.3)
Chloride: 97 mmol/L — ABNORMAL LOW (ref 98–111)
Creatinine, Ser: 2.36 mg/dL — ABNORMAL HIGH (ref 0.44–1.00)
GFR, Estimated: 23 mL/min — ABNORMAL LOW (ref >60)
Glucose, Bld: 129 mg/dL — ABNORMAL HIGH (ref 70–99)
Potassium: 3.1 mmol/L — ABNORMAL LOW (ref 3.5–5.1)
Sodium: 134 mmol/L — ABNORMAL LOW (ref 135–145)
Total Bilirubin: 0.4 mg/dL (ref 0.3–1.2)
Total Protein: 7.4 g/dL (ref 6.5–8.1)

## 2022-11-30 LAB — MAGNESIUM: Magnesium: 1.7 mg/dL (ref 1.7–2.4)

## 2022-11-30 LAB — C-REACTIVE PROTEIN: CRP: 2.7 mg/dL — ABNORMAL HIGH (ref <1.0)

## 2022-11-30 LAB — GLUCOSE, CAPILLARY
Glucose-Capillary: 122 mg/dL — ABNORMAL HIGH (ref 70–99)
Glucose-Capillary: 129 mg/dL — ABNORMAL HIGH (ref 70–99)

## 2022-11-30 LAB — HEPARIN LEVEL (UNFRACTIONATED): Heparin Unfractionated: <0.1 IU/mL — ABNORMAL LOW (ref 0.30–0.70)

## 2022-11-30 MED ORDER — GABAPENTIN 100 MG PO CAPS: 100.0000 mg | ORAL_CAPSULE | Freq: Three times a day (TID) | ORAL | 0 refills | Status: DC

## 2022-11-30 MED ORDER — FERRIC CITRATE 1 GM 210 MG(FE) PO TABS: 420.0000 mg | ORAL_TABLET | Freq: Three times a day (TID) | ORAL | 1 refills | Status: DC

## 2022-11-30 MED ORDER — MIDODRINE HCL 5 MG PO TABS: 5.0000 mg | ORAL_TABLET | Freq: Three times a day (TID) | ORAL | 2 refills | Status: DC

## 2022-11-30 MED ORDER — PREDNISONE 10 MG PO TABS: ORAL_TABLET | ORAL | 0 refills | Status: AC

## 2022-11-30 MED ORDER — COLCHICINE 0.6 MG PO TABS: 0.3000 mg | ORAL_TABLET | ORAL | 2 refills | Status: DC

## 2022-11-30 MED ORDER — DOXERCALCIFEROL 4 MCG/2ML IV SOLN: 4.0000 ug | INTRAVENOUS | 0 refills | Status: DC

## 2022-11-30 MED ORDER — ALLOPURINOL 100 MG PO TABS: 100.0000 mg | ORAL_TABLET | Freq: Two times a day (BID) | ORAL | 0 refills | Status: AC

## 2022-11-30 MED ORDER — GABAPENTIN 100 MG PO CAPS: 100.0000 mg | ORAL_CAPSULE | ORAL | Status: DC

## 2022-11-30 MED ORDER — MELATONIN 5 MG PO TABS: 5.0000 mg | ORAL_TABLET | Freq: Every evening | ORAL | Status: DC | PRN

## 2022-11-30 NOTE — Progress Notes (Signed)
[name] Progress Note   Subjective: Seen in room. Lying naked in bed, having phone conversation.  Dialysis last night with 2.4L removed Angiogram showing patent vessels   Objective Vitals:   11/30/22 0300 11/30/22 0330 11/30/22 0419 11/30/22 0828  BP: 110/65 (!) 123/48 117/77   Pulse: 100 97 100 (!) 105  Resp: 18 20 18    Temp:   98 F (36.7 C) 98.1 F (36.7 C)  TempSrc:   Oral Axillary  SpO2:  100% 99%   Weight:       Physical Exam General: Obese woman, nad Heart: Regular rate Lungs: Bilateral chest rise, normal wob Abdomen: soft, non-tender Extremities: 1+ BLE edema. Drsgs on feet Dialysis Access: R TDC/LUE AVF   Additional Objective Labs: Basic Metabolic Panel: Recent Labs  Lab 11/25/22 0609 11/26/22 0700 11/27/22 0453 11/28/22 1206 11/29/22 0542 11/29/22 1740 11/30/22 0759  NA 131*   < > 132*   < > 132* 132* 134*  K 3.1*   < > 3.3*   < > 3.7 4.4 3.1*  CL 95*   < > 97*   < > 97* 97* 97*  CO2 25   < > 18*   < > 24 20* 25  GLUCOSE 76   < > 132*   < > 159* 228* 129*  BUN 18   < > 17   < > 30* 34* 15  CREATININE 3.54*   < > 3.14*   < > 3.81* 3.89* 2.36*  CALCIUM 7.6*   < > 8.1*   < > 8.4* 8.6* 8.4*  PHOS 3.4  --  4.1  --   --  3.8  --    < > = values in this interval not displayed.   Liver Function Tests: Recent Labs  Lab 11/28/22 1206 11/29/22 0542 11/29/22 1740 11/30/22 0759  AST 24 13*  --  16  ALT 18 16  --  15  ALKPHOS 68 60  --  72  BILITOT 1.1 0.9  --  0.4  PROT 7.4 6.4*  --  7.4  ALBUMIN 2.5* 2.3* 2.8* 2.7*   No results for input(s): "LIPASE", "AMYLASE" in the last 168 hours. CBC: Recent Labs  Lab 11/24/22 0513 11/24/22 2250 11/25/22 0609 11/26/22 0700 11/27/22 0453 11/28/22 1206 11/29/22 0542 11/29/22 1748 11/29/22 2300  WBC 19.8*  --  19.1*   < > 20.5* 15.8* 20.3* 19.6* 21.8*  NEUTROABS 17.0*  --  16.4*  --   --   --   --   --   --   HGB 6.9*   < > 7.6*   < > 7.6* 8.3* 7.3* 8.6* 7.5*  HCT 23.4*   < > 26.8*    < > 26.1* 29.2* 25.2* 29.9* 26.2*  MCV 90.7  --  90.8   < > 94.2 92.1 92.6 93.1 94.2  PLT 340  --  429*   < > 466* 541* 450* 537* 479*   < > = values in this interval not displayed.   Blood Culture    Component Value Date/Time   SDES BLOOD SITE NOT SPECIFIED 11/23/2022 0023   SPECREQUEST  11/23/2022 0023    BOTTLES DRAWN AEROBIC AND ANAEROBIC Blood Culture adequate volume   CULT  11/23/2022 0023    NO GROWTH 5 DAYS Performed at [facility], [address] [place], [place] [address]    REPTSTATUS 11/28/2022 FINAL 11/23/2022 0023    Cardiac Enzymes: No results for input(s): "CKTOTAL", "CKMB", "CKMBINDEX", "  TROPONINI" in the last 168 hours. CBG: Recent Labs  Lab 11/29/22 0852 11/29/22 1229 11/29/22 1850 11/29/22 2031 11/30/22 0832  GLUCAP 129* 146* 227* 203* 122*   Iron Studies: No results for input(s): "IRON", "TIBC", "TRANSFERRIN", "FERRITIN" in the last 72 hours. @lablastinr3 @ Studies/Results: PERIPHERAL VASCULAR CATHETERIZATION  Result Date: 11/29/2022 Patient name: Deborah [name]  MRN: [id number]        DOB: [birth year]          Sex: female  11/29/2022 Pre-operative Diagnosis: Peripheral arterial disease with bilateral heel wounds Post-operative diagnosis:  Same Surgeon:  Daria Pastures, MD Procedure Performed: 1.  Ultrasound-guided access of left common femoral artery 2.  Abdominal aortogram 3.  Second order cannulation of the right external iliac artery 4.  Right lower extremity angiogram 5.  Left lower extremity angiogram via sheath 6.  Pro-glide closure of left common femoral artery 7.  28-minute moderate sedation   Indications: Deborah [name] is a 57 year old female with history of ESRD, A-fib on Eliquis, hypertension, HFpEF, type 2 diabetes who presented with weakness fatigue and multiple episodes of nausea/vomiting's been being treated for sepsis and a possible source of a right heel wound.  ABIs were obtained and demonstrated 0.8 bilaterally with toe pressures  in the 60s which are borderline for wound healing.  Risks and benefits of angiogram were reviewed and she was willing to proceed.  Findings: Widely patent aorta, bilateral iliac systems, bilateral common femorals, bilateral SFA and popliteals with three-vessel runoff.  There did appear to be good perfusion to the right heel wound.             Procedure:  The patient was identified in the holding area and taken to room 8.  The patient was then placed supine on the table and prepped and draped in the usual sterile fashion.  A time out was called.  Ultrasound was used to evaluate the left common femoral artery.  It was patent .  A digital ultrasound image was acquired.  A micropuncture needle was used to access the left common femoral artery under ultrasound guidance.  An 018 wire was advanced without resistance and a micropuncture sheath was placed.  The 018 wire was removed and a benson wire was placed.  The micropuncture sheath was exchanged for a 5 french sheath.  An omniflush catheter was advanced over the wire to the level of L-1.  An abdominal angiogram was obtained.  Next, using the omniflush catheter and a stiff angled Glidewire, the aortic bifurcation was crossed and the catheter was placed into the right external iliac artery and right runoff was obtained.  left runoff was performed via retrograde sheath injections.  A Pro-glide Perclose closure device was then used to close the arteriotomy of the left common femoral artery.   ABORTED INVASIVE LAB PROCEDURE  Result Date: 11/29/2022 This case was aborted.  Medications:  sodium chloride     heparin Stopped (11/29/22 1121)    doxercalciferol  4 mcg Intravenous Q M,W,F-HD   DULoxetine  40 mg Oral Daily   ferric citrate  420 mg Oral TID WC   [START ON 12/01/2022] gabapentin  100 mg Oral Once per day on Monday Wednesday Friday   insulin aspart  0-15 Units Subcutaneous TID WC   insulin glargine-yfgn  30 Units Subcutaneous Daily   leptospermum manuka  honey  1 Application Topical Daily   methylPREDNISolone (SOLU-MEDROL) injection  60 mg Intravenous Q24H   midodrine  5 mg Oral TID WC   mirtazapine  15 mg Oral QHS   pantoprazole  40 mg Oral Daily   sodium chloride flush  10-40 mL Intracatheter Q12H   sodium chloride flush  3 mL Intravenous Q12H   sodium chloride flush  3 mL Intravenous Q12H     06/30/2022 -- > Dr Chestine Spore of [facility] did L BC AVF placement     OP HD: [facility] MWF  4h  400/800    126.9kg  RIJ TDC/  LUA AVF (maturing)  - Heparin 5000 units IV initial bolus.Heparin 2500 units IV midrun - hectorol 7 mcg IV tiw - venofer 50mg  IV weekly (Hold while getting ABX) - mircera 225 mcg IV q 2 wks     Assessment/ Plan: Sepsis/ SSTI - initially concern for R ankle osteomyelitis.  Responding to IV abx and IV fluids. Blood cx neg to date.  PAD/R ankle ulcer - [facility] following. S/p angiogram with patent vessels. Optimized from vascular standpoint  H/O metastatic carcinoid tumor ESRD - on HD MWF.Back on schedule. Next HD 9/25. Added K bath  H/o HTN - BP's soft, antihypertensive meds on hold. Opioids probably contributing to hypotension.  Volume - Improved post HD. Net UF 2 liters with HD 9/21 Anemia esrd - Hb 7s s/p 1 Unit PRBCs 9/18. Next  esa is due on 9/27. Follow HGB. Transfuse if HGB < 7.0.  MBD ckd - CCa in range.PO4 at goal. Cont Auryxia binder Hyponatremia - Improving with volume removal. Follow labs.  H/o afib H/o chronic pain Deconditioning - pt is WC to chair/ bed dependent  Tomasa Blase PA-C [facility] 11/30/2022,10:43 AM

## 2022-11-30 NOTE — Progress Notes (Signed)
Provider at bedside, states plan is for pt to leave later today so ok for no IV at this time. Primary rn aware.

## 2022-11-30 NOTE — Progress Notes (Signed)
   11/30/22 0330  Vitals  BP (!) 123/48  BP Location Left Arm  BP Method Automatic  Patient Position (if appropriate) Lying  Pulse Rate 97  Pulse Rate Source Monitor  Resp 20  Oxygen Therapy  SpO2 100 %  During Treatment Monitoring  Intra-Hemodialysis Comments Tx completed  Post Treatment  Dialyzer Clearance Heavily streaked  Liters Processed 95.1  Fluid Removed (mL) 2400 mL  Tolerated HD Treatment Yes  Post-Hemodialysis Comments pt stable  Hemodialysis Catheter Right Subclavian Double lumen Permanent (Tunneled)  Placement Date/Time: 06/24/22 1644   Serial / Lot #: [id number]  Expiration Date: 09/08/26  Time Out: Correct patient;Correct site;Correct procedure  Maximum sterile barrier precautions: Hand hygiene;Mask;Cap;Sterile gloves;Sterile gown;Large sterile ...  Site Condition No complications  Blue Lumen Status Heparin locked  Red Lumen Status Heparin locked  Catheter fill solution Heparin 1000 units/ml  Catheter fill volume (Arterial) 1.9 cc  Catheter fill volume (Venous) 1.9  Dressing Type Transparent  Dressing Status Antimicrobial disc in place;Clean, Dry, Intact  Interventions New dressing  Dressing Change Due 12/06/22   Report Handoff to Clyde Lundborg RN

## 2022-11-30 NOTE — Progress Notes (Signed)
 D/C order noted. Contacted [facility] to advise clinic of pt's d/c today and that pt should resume care tomorrow.   Tracy Mounce Renal Navigator [phone] 

## 2022-11-30 NOTE — Progress Notes (Signed)
Pt came back from dialysis in 10/10 pain, hungry, and tired. Given pain med and a sandwich. Pt wants to rest and is refusing wound care on her heals at this time. Would like wound care to be done when she wakes during shift.

## 2022-11-30 NOTE — Progress Notes (Signed)
PT Cancellation Note  Patient Details Name: Deborah [name] MRN: [id number] DOB: October 27, 1965   Cancelled Treatment:    Reason Eval/Treat Not Completed: Pain limiting ability to participate. Pt with abdominal pain and already medicated.    Angelina Ok [facility] 11/30/2022, 11:40 AM Skip Mayer PT Acute [employer] [phone]

## 2022-11-30 NOTE — Progress Notes (Signed)
Patient has not been on heparin per orders. Ask Edilia Bo MD,vascular surgery, to see if the patient needs to have it. He states she does not need to be on it from his stand point. It will be up to the MD following heart this moment. Notified Lancaster MD and he stated no need since vascular has stated other wise. Also, notified that she has been requesting more pain medication and said No. We will resume her normal pain regime at home. Which is already on the patient's chart.       AT 1205pm, Deborah [name], patient mother wanted an update on the condition and if the patient will be discharged. I notified her that the patient will be discharged. The patient will be discharged once HOME HEALTH agency accepts her. She has asked if the patient can have something for pain. I notified her that the patient has already had something for pain this morning and she can't have anything at this moment. The patient and Mom stated, she takes it every two hours while at home. I notified her it is three times a day. Will continue to monitor patient.

## 2022-11-30 NOTE — Progress Notes (Addendum)
  Progress Note    11/30/2022 10:08 AM 1 Day Post-Op  Subjective:  pins and needles in both feet   Vitals:   11/30/22 0419 11/30/22 0828  BP: 117/77   Pulse: 100 (!) 105  Resp: 18   Temp: 98 F (36.7 C) 98.1 F (36.7 C)  SpO2: 99%    Physical Exam: Lungs:  non labored Incisions:  L groin cath site without hematoma Neurologic: A&O  CBC    Component Value Date/Time   WBC 21.8 (H) 11/29/2022 2300   RBC 2.78 (L) 11/29/2022 2300   HGB 7.5 (L) 11/29/2022 2300   HGB 8.8 (L) 10/09/2019 0906   HGB 9.6 (L) 09/03/2019 1620   HCT 26.2 (L) 11/29/2022 2300   HCT 30.4 (L) 09/03/2019 1620   PLT 479 (H) 11/29/2022 2300   PLT 348 10/09/2019 0906   PLT 451 (H) 09/03/2019 1620   MCV 94.2 11/29/2022 2300   MCV 85 09/03/2019 1620   MCH 27.0 11/29/2022 2300   MCHC 28.6 (L) 11/29/2022 2300   RDW 16.2 (H) 11/29/2022 2300   RDW 13.6 09/03/2019 1620   LYMPHSABS 1.3 11/25/2022 0609   LYMPHSABS 2.2 09/03/2019 1620   MONOABS 0.8 11/25/2022 0609   EOSABS 0.3 11/25/2022 0609   EOSABS 0.3 09/03/2019 1620   BASOSABS 0.1 11/25/2022 0609   BASOSABS 0.1 09/03/2019 1620    BMET    Component Value Date/Time   NA 132 (L) 11/29/2022 1740   NA 137 09/20/2019 1530   K 4.4 11/29/2022 1740   CL 97 (L) 11/29/2022 1740   CO2 20 (L) 11/29/2022 1740   GLUCOSE 228 (H) 11/29/2022 1740   BUN 34 (H) 11/29/2022 1740   BUN 18 09/20/2019 1530   CREATININE 3.89 (H) 11/29/2022 1740   CALCIUM 8.6 (L) 11/29/2022 1740   GFRNONAA 13 (L) 11/29/2022 1740   GFRAA 49 (L) 11/30/2019 0356    INR    Component Value Date/Time   INR 1.8 (H) 11/23/2022 0023     Intake/Output Summary (Last 24 hours) at 11/30/2022 1008 Last data filed at 11/30/2022 0330 Gross per 24 hour  Intake --  Output 2400 ml  Net -2400 ml     Assessment/Plan:  57 y.o. female is s/p diagnostic RLE arteriogram 1 Day Post-Op   Based on angiography, patient is optimized from a vascular surgery standpoint.  She has 3 vessel runoff with  patent popliteal, SFA, and CFA.  She will need meticulous wound care in addition to avoiding pressure to R heel to avoid major leg amputation.  Ok to restart heparin from our standpoint if needed.  Call vascular with questions.   Emilie Rutter, PA-C [facility] [phone] 11/30/2022 10:08 AM   VASCULAR STAFF ADDENDUM: I have independently interviewed and examined the patient. I agree with the above.  Angiogram demonstrated widely patent lower extremity arterial vasculature with 3 vessel runoff and no macrovascular lesions amenable to intervention.   Daria Pastures MD [facility] Phone Number: [phone] 11/30/2022 12:30 PM

## 2022-11-30 NOTE — TOC Initial Note (Signed)
Transition of Care [facility]) - Initial/Assessment Note    Patient Details  Name: Deborah [name] MRN: [id number] Date of Birth: 04/10/1965  Transition [facility]) CM/SW Contact:    Gala Lewandowsky, RN Phone Number: 11/30/2022, 12:40 PM  Clinical Narrative:  Patient presented for sepsis. PTA patient was from home with spouse. Patient is in need of home health RN for wound care. Patient states spouse can learn to care for wounds. Patient does not have a preference for agency. [facility] is available for start of care within 24-48 hours post transition home. Patient will need transportation to home via [facility]. No further needs identified at this time.               Expected Discharge Plan: Home w Home Health Services Barriers to Discharge: No Barriers Identified   Patient Goals and CMS Choice Patient states their goals for this hospitalization and ongoing recovery are:: to return home   Choice offered to / list presented to : Patient (no agency preference.)   Expected Discharge Plan and Services   Discharge Planning Services: CM Consult Post Acute Care Choice: NA Living arrangements for the past 2 months: Apartment                   DME Agency: NA    Prior Living Arrangements/Services Living arrangements for the past 2 months: Apartment Lives with:: Spouse Patient language and need for interpreter reviewed:: Yes Do you feel safe going back to the place where you live?: Yes      Need for Family Participation in Patient Care: Yes (Comment) Care giver support system in place?: Yes (comment) Current home services: DME, Homehealth aide Criminal Activity/Legal Involvement Pertinent to Current Situation/Hospitalization: No - Comment as needed  Activities of Daily Living Home Assistive Devices/Equipment: Cane (specify quad or straight), Eyeglasses, Walker (specify type), Wheelchair, Shower chair with back, CBG Meter, Bedside commode/3-in-1 ADL Screening (condition at time  of admission) Is the patient deaf or have difficulty hearing?: No Does the patient have difficulty seeing, even when wearing glasses/contacts?: No Does the patient have difficulty concentrating, remembering, or making decisions?: No  Permission Sought/Granted Permission sought to share information with : Case Production designer, theatre/television/film, Magazine features editor, Family Supports Permission granted to share information with : Yes, Verbal Permission Granted     Permission granted to share info w AGENCY: [facility]   Emotional Assessment Appearance:: Appears stated age Attitude/Demeanor/Rapport: Engaged Affect (typically observed): Appropriate Orientation: : Oriented to Self, Oriented to Place, Oriented to  Time, Oriented to Situation Alcohol / Substance Use: Not Applicable Psych Involvement: No (comment)  Admission diagnosis:  Sepsis (HCC) [A41.9] Sepsis, due to unspecified organism, unspecified whether acute organ dysfunction present [facility]) [A41.9] Patient Active Problem List   Diagnosis Date Noted   Non-pressure chronic ulcer of right heel and midfoot limited to breakdown of skin (HCC) 11/24/2022   Sepsis (HCC) 11/23/2022   Elevated troponin 09/09/2022   Nausea & vomiting 09/09/2022   End stage renal disease on dialysis (HCC) 09/09/2022   Chronic a-fib (HCC) 09/09/2022   Atrial fibrillation with RVR (HCC)-resolved 09/09/2022   Peripheral neuropathy 09/09/2022   (HFpEF) heart failure with preserved ejection fraction (HCC) 09/09/2022   Hypotension 09/09/2022   CHF (congestive heart failure) (HCC) 07/13/2022   HCAP (healthcare-associated pneumonia) 06/19/2022   Acute renal failure superimposed on stage 4 chronic kidney disease (HCC) 05/29/2022   Acute encephalopathy 05/29/2022   Hyponatremia 05/29/2022   Chronic anemia 05/22/2022   Hyperosmolar hyperglycemic state (HHS) (  HCC) 05/11/2022   Hyperosmolar non-ketotic state due to type 2 diabetes mellitus (HCC) 05/11/2022   Leukocytosis 05/11/2022    Edema of left upper extremity 03/21/2022   Tachycardia 03/18/2022   Atypical chest pain 09/10/2021   Acute pyelonephritis    Acute kidney injury superimposed on chronic kidney disease (HCC) 08/16/2021   Acute on chronic kidney failure (HCC) 08/15/2021   PAF (paroxysmal atrial fibrillation) (HCC)    Chronic pain 09/04/2019   Malignant carcinoid tumor of duodenum (HCC)    Nonalcoholic fatty liver disease 06/05/2019   Restrictive lung disease secondary to obesity    History of gastric ulcer    Fibroid uterus 02/23/2019   Abdominal pain 07/17/2018   Anxiety 11/29/2017   Spinal stenosis, lumbar region with neurogenic claudication 08/03/2017   GERD (gastroesophageal reflux disease) 03/19/2017   Depression 03/19/2017   Morbid obesity (HCC)    Normocytic anemia 08/16/2016   Diabetic gastroparesis (HCC) 08/16/2016   Gout 06/05/2016   Chronic hypokalemia 09/26/2015   Hypomagnesemia and hypokalemia 09/26/2015   Insulin dependent type 2 diabetes mellitus (HCC) 05/25/2015   Chronic kidney disease (CKD), stage IV (severe) (HCC) 05/25/2015   Essential hypertension 09/28/2013   PCP:  Oneita Hurt, No Pharmacy:   [facility] - [place], [place] - [address] [place] [address] Phone: [phone] Fax: [phone]  [facility] [address] [place] [address] Phone: [phone] Fax: [phone]  [id number] [place], [place] - [address] [facility] [address] [place] [address] Phone: [phone] Fax: [phone]  Social Determinants of Health (SDOH) Social History: SDOH Screenings   Food Insecurity: No Food Insecurity (11/23/2022)  Housing: Low Risk  (11/23/2022)  Transportation Needs: No Transportation Needs (11/23/2022)  Utilities: Not At Risk (11/23/2022)  Depression (PHQ2-9): High Risk (11/18/2022)  Financial Resource Strain: Low Risk (03/23/2021)    [facility])  Physical Activity: Not on File (10/31/2017)   Received from [place], [place]  Social Connections: Unknown (07/19/2021)   Received from [facility]  Stress: Low Risk (03/23/2021)   [facility])  Tobacco Use: Low Risk  (11/18/2022)   SDOH Interventions:     Readmission Risk Interventions    11/25/2022    5:16 PM 07/05/2022    1:20 PM  Readmission Risk Prevention Plan  Transportation Screening Complete Complete  Medication Review (RN Care Manager) Complete Referral to Pharmacy  PCP or Specialist appointment within 3-5 days of discharge Complete Complete  HRI or Home Care Consult Complete Complete  SW Recovery Care/Counseling Consult Complete Complete  Palliative Care Screening Not Applicable Not Applicable  Skilled Nursing Facility Not Applicable Not Applicable

## 2022-11-30 NOTE — Plan of Care (Addendum)
[facility] Patient Discharge Orders- [facility] CLINIC: [facility]  Patient's name: KLIYAH SHERIN Admit/DC Dates: 11/22/2022 - 11/30/2022  Discharge Diagnoses: R heel ulcer s/p angiogram. Patent vasculature. No targets for intervention Gout    Aranesp: Given: --   Date and amount of last dose: --  Last Hgb: 7.5 PRBC's Given: Yes Date/# of units: 9/18 1 unit  ESA dose for discharge: Mircera 225 mcg IV q 2 weeks  IV Iron dose at discharge: --  Heparin change: --  EDW Change: --  Bath Change: --  Access intervention/Change: --  Hectorol/Calcitriol change: --  Discharge Labs: Calcium 8.4 Phosphorus -- Albumin 2.7 K+ 3.1  IV Antibiotics: --   On Coumadin?: --   OTHER/APPTS/LAB ORDERS:    D/C Meds to be reconciled by nurse after every discharge.  Completed By: Tomasa Blase PA-C   Reviewed by: MD:______ RN_______

## 2022-11-30 NOTE — Discharge Summary (Signed)
Consultations: Nephrology, ID   Procedures/Studies: PERIPHERAL VASCULAR CATHETERIZATION  Result Date: 11/29/2022 Patient name: Deborah [name]  MRN: [id number]        DOB: September 20, 1965          Sex: female  11/29/2022 Pre-operative Diagnosis: Peripheral arterial disease with bilateral heel wounds Post-operative diagnosis:  Same Surgeon:  Daria Pastures, MD Procedure Performed: 1.  Ultrasound-guided access of left common femoral artery 2.  Abdominal aortogram 3.  Second order cannulation of the right external iliac artery 4.  Right lower extremity angiogram 5.  Left lower extremity angiogram via sheath 6.  Pro-glide closure of left common femoral artery 7.  28-minute moderate sedation   Indications: Deborah [name] is a 57 year old female with history of ESRD, A-fib on Eliquis, hypertension, HFpEF, type 2 diabetes who presented with weakness fatigue and multiple episodes of nausea/vomiting's been being treated for sepsis and a possible source of a right heel wound.  ABIs were obtained and demonstrated 0.8 bilaterally with toe pressures in the 60s which are borderline for wound healing.  Risks and benefits of angiogram were reviewed and she was willing to proceed.  Findings: Widely patent aorta, bilateral iliac systems, bilateral common femorals, bilateral SFA and popliteals with three-vessel runoff.  There did appear to be good perfusion to the right heel wound.              Procedure:  The patient was identified in the holding area and taken to room 8.  The patient was then placed supine on the table and prepped and draped in the usual sterile fashion.  A time out was called.  Ultrasound was used to evaluate the left common femoral artery.  It was patent .  A digital ultrasound image was acquired.  A micropuncture needle was used to access the left common femoral artery under ultrasound guidance.  An 018 wire was advanced without resistance and a micropuncture sheath was placed.  The 018 wire was removed and a benson wire was placed.  The micropuncture sheath was exchanged for a 5 french sheath.  An omniflush catheter was advanced over the wire to the level of L-1.  An abdominal angiogram was obtained.  Next, using the omniflush catheter and a stiff angled Glidewire, the aortic bifurcation was crossed and the catheter was placed into the right external iliac artery and right runoff was obtained.  left runoff was performed via retrograde sheath injections.  A Pro-glide Perclose closure device was then used to close the arteriotomy of the left common femoral artery.   ABORTED INVASIVE LAB PROCEDURE  Result Date: 11/29/2022 This case was aborted.  CT CHEST ABDOMEN PELVIS WO CONTRAST  Result Date: 11/25/2022 CLINICAL DATA:  Sepsis EXAM: CT CHEST, ABDOMEN AND PELVIS WITHOUT CONTRAST TECHNIQUE: Multidetector CT imaging of the chest, abdomen and pelvis was performed following the standard protocol without IV contrast. RADIATION DOSE REDUCTION: This exam was performed according to the departmental dose-optimization program which includes automated exposure control, adjustment of the mA and/or kV according to patient size and/or use of iterative reconstruction technique. COMPARISON:  Abdominal ultrasound 11/23/2022. FINDINGS: CT CHEST FINDINGS Cardiovascular: No significant vascular findings. Normal heart size. No pericardial effusion. Mediastinum/Nodes: No enlarged mediastinal,  hilar, or axillary lymph nodes. Thyroid gland, trachea, and esophagus demonstrate no significant findings. Lungs/Pleura: Lungs are clear. No pleural effusion or pneumothorax. Musculoskeletal: No chest wall mass or suspicious bone lesions identified. CT ABDOMEN PELVIS FINDINGS Hepatobiliary: No focal liver abnormality is seen. Status post cholecystectomy. No biliary dilatation.  Consultations: Nephrology, ID   Procedures/Studies: PERIPHERAL VASCULAR CATHETERIZATION  Result Date: 11/29/2022 Patient name: Deborah [name]  MRN: [id number]        DOB: September 20, 1965          Sex: female  11/29/2022 Pre-operative Diagnosis: Peripheral arterial disease with bilateral heel wounds Post-operative diagnosis:  Same Surgeon:  Daria Pastures, MD Procedure Performed: 1.  Ultrasound-guided access of left common femoral artery 2.  Abdominal aortogram 3.  Second order cannulation of the right external iliac artery 4.  Right lower extremity angiogram 5.  Left lower extremity angiogram via sheath 6.  Pro-glide closure of left common femoral artery 7.  28-minute moderate sedation   Indications: Deborah [name] is a 57 year old female with history of ESRD, A-fib on Eliquis, hypertension, HFpEF, type 2 diabetes who presented with weakness fatigue and multiple episodes of nausea/vomiting's been being treated for sepsis and a possible source of a right heel wound.  ABIs were obtained and demonstrated 0.8 bilaterally with toe pressures in the 60s which are borderline for wound healing.  Risks and benefits of angiogram were reviewed and she was willing to proceed.  Findings: Widely patent aorta, bilateral iliac systems, bilateral common femorals, bilateral SFA and popliteals with three-vessel runoff.  There did appear to be good perfusion to the right heel wound.              Procedure:  The patient was identified in the holding area and taken to room 8.  The patient was then placed supine on the table and prepped and draped in the usual sterile fashion.  A time out was called.  Ultrasound was used to evaluate the left common femoral artery.  It was patent .  A digital ultrasound image was acquired.  A micropuncture needle was used to access the left common femoral artery under ultrasound guidance.  An 018 wire was advanced without resistance and a micropuncture sheath was placed.  The 018 wire was removed and a benson wire was placed.  The micropuncture sheath was exchanged for a 5 french sheath.  An omniflush catheter was advanced over the wire to the level of L-1.  An abdominal angiogram was obtained.  Next, using the omniflush catheter and a stiff angled Glidewire, the aortic bifurcation was crossed and the catheter was placed into the right external iliac artery and right runoff was obtained.  left runoff was performed via retrograde sheath injections.  A Pro-glide Perclose closure device was then used to close the arteriotomy of the left common femoral artery.   ABORTED INVASIVE LAB PROCEDURE  Result Date: 11/29/2022 This case was aborted.  CT CHEST ABDOMEN PELVIS WO CONTRAST  Result Date: 11/25/2022 CLINICAL DATA:  Sepsis EXAM: CT CHEST, ABDOMEN AND PELVIS WITHOUT CONTRAST TECHNIQUE: Multidetector CT imaging of the chest, abdomen and pelvis was performed following the standard protocol without IV contrast. RADIATION DOSE REDUCTION: This exam was performed according to the departmental dose-optimization program which includes automated exposure control, adjustment of the mA and/or kV according to patient size and/or use of iterative reconstruction technique. COMPARISON:  Abdominal ultrasound 11/23/2022. FINDINGS: CT CHEST FINDINGS Cardiovascular: No significant vascular findings. Normal heart size. No pericardial effusion. Mediastinum/Nodes: No enlarged mediastinal,  hilar, or axillary lymph nodes. Thyroid gland, trachea, and esophagus demonstrate no significant findings. Lungs/Pleura: Lungs are clear. No pleural effusion or pneumothorax. Musculoskeletal: No chest wall mass or suspicious bone lesions identified. CT ABDOMEN PELVIS FINDINGS Hepatobiliary: No focal liver abnormality is seen. Status post cholecystectomy. No biliary dilatation.  Consultations: Nephrology, ID   Procedures/Studies: PERIPHERAL VASCULAR CATHETERIZATION  Result Date: 11/29/2022 Patient name: Deborah [name]  MRN: [id number]        DOB: September 20, 1965          Sex: female  11/29/2022 Pre-operative Diagnosis: Peripheral arterial disease with bilateral heel wounds Post-operative diagnosis:  Same Surgeon:  Daria Pastures, MD Procedure Performed: 1.  Ultrasound-guided access of left common femoral artery 2.  Abdominal aortogram 3.  Second order cannulation of the right external iliac artery 4.  Right lower extremity angiogram 5.  Left lower extremity angiogram via sheath 6.  Pro-glide closure of left common femoral artery 7.  28-minute moderate sedation   Indications: Deborah [name] is a 57 year old female with history of ESRD, A-fib on Eliquis, hypertension, HFpEF, type 2 diabetes who presented with weakness fatigue and multiple episodes of nausea/vomiting's been being treated for sepsis and a possible source of a right heel wound.  ABIs were obtained and demonstrated 0.8 bilaterally with toe pressures in the 60s which are borderline for wound healing.  Risks and benefits of angiogram were reviewed and she was willing to proceed.  Findings: Widely patent aorta, bilateral iliac systems, bilateral common femorals, bilateral SFA and popliteals with three-vessel runoff.  There did appear to be good perfusion to the right heel wound.              Procedure:  The patient was identified in the holding area and taken to room 8.  The patient was then placed supine on the table and prepped and draped in the usual sterile fashion.  A time out was called.  Ultrasound was used to evaluate the left common femoral artery.  It was patent .  A digital ultrasound image was acquired.  A micropuncture needle was used to access the left common femoral artery under ultrasound guidance.  An 018 wire was advanced without resistance and a micropuncture sheath was placed.  The 018 wire was removed and a benson wire was placed.  The micropuncture sheath was exchanged for a 5 french sheath.  An omniflush catheter was advanced over the wire to the level of L-1.  An abdominal angiogram was obtained.  Next, using the omniflush catheter and a stiff angled Glidewire, the aortic bifurcation was crossed and the catheter was placed into the right external iliac artery and right runoff was obtained.  left runoff was performed via retrograde sheath injections.  A Pro-glide Perclose closure device was then used to close the arteriotomy of the left common femoral artery.   ABORTED INVASIVE LAB PROCEDURE  Result Date: 11/29/2022 This case was aborted.  CT CHEST ABDOMEN PELVIS WO CONTRAST  Result Date: 11/25/2022 CLINICAL DATA:  Sepsis EXAM: CT CHEST, ABDOMEN AND PELVIS WITHOUT CONTRAST TECHNIQUE: Multidetector CT imaging of the chest, abdomen and pelvis was performed following the standard protocol without IV contrast. RADIATION DOSE REDUCTION: This exam was performed according to the departmental dose-optimization program which includes automated exposure control, adjustment of the mA and/or kV according to patient size and/or use of iterative reconstruction technique. COMPARISON:  Abdominal ultrasound 11/23/2022. FINDINGS: CT CHEST FINDINGS Cardiovascular: No significant vascular findings. Normal heart size. No pericardial effusion. Mediastinum/Nodes: No enlarged mediastinal,  hilar, or axillary lymph nodes. Thyroid gland, trachea, and esophagus demonstrate no significant findings. Lungs/Pleura: Lungs are clear. No pleural effusion or pneumothorax. Musculoskeletal: No chest wall mass or suspicious bone lesions identified. CT ABDOMEN PELVIS FINDINGS Hepatobiliary: No focal liver abnormality is seen. Status post cholecystectomy. No biliary dilatation.  Consultations: Nephrology, ID   Procedures/Studies: PERIPHERAL VASCULAR CATHETERIZATION  Result Date: 11/29/2022 Patient name: Deborah [name]  MRN: [id number]        DOB: September 20, 1965          Sex: female  11/29/2022 Pre-operative Diagnosis: Peripheral arterial disease with bilateral heel wounds Post-operative diagnosis:  Same Surgeon:  Daria Pastures, MD Procedure Performed: 1.  Ultrasound-guided access of left common femoral artery 2.  Abdominal aortogram 3.  Second order cannulation of the right external iliac artery 4.  Right lower extremity angiogram 5.  Left lower extremity angiogram via sheath 6.  Pro-glide closure of left common femoral artery 7.  28-minute moderate sedation   Indications: Deborah [name] is a 57 year old female with history of ESRD, A-fib on Eliquis, hypertension, HFpEF, type 2 diabetes who presented with weakness fatigue and multiple episodes of nausea/vomiting's been being treated for sepsis and a possible source of a right heel wound.  ABIs were obtained and demonstrated 0.8 bilaterally with toe pressures in the 60s which are borderline for wound healing.  Risks and benefits of angiogram were reviewed and she was willing to proceed.  Findings: Widely patent aorta, bilateral iliac systems, bilateral common femorals, bilateral SFA and popliteals with three-vessel runoff.  There did appear to be good perfusion to the right heel wound.              Procedure:  The patient was identified in the holding area and taken to room 8.  The patient was then placed supine on the table and prepped and draped in the usual sterile fashion.  A time out was called.  Ultrasound was used to evaluate the left common femoral artery.  It was patent .  A digital ultrasound image was acquired.  A micropuncture needle was used to access the left common femoral artery under ultrasound guidance.  An 018 wire was advanced without resistance and a micropuncture sheath was placed.  The 018 wire was removed and a benson wire was placed.  The micropuncture sheath was exchanged for a 5 french sheath.  An omniflush catheter was advanced over the wire to the level of L-1.  An abdominal angiogram was obtained.  Next, using the omniflush catheter and a stiff angled Glidewire, the aortic bifurcation was crossed and the catheter was placed into the right external iliac artery and right runoff was obtained.  left runoff was performed via retrograde sheath injections.  A Pro-glide Perclose closure device was then used to close the arteriotomy of the left common femoral artery.   ABORTED INVASIVE LAB PROCEDURE  Result Date: 11/29/2022 This case was aborted.  CT CHEST ABDOMEN PELVIS WO CONTRAST  Result Date: 11/25/2022 CLINICAL DATA:  Sepsis EXAM: CT CHEST, ABDOMEN AND PELVIS WITHOUT CONTRAST TECHNIQUE: Multidetector CT imaging of the chest, abdomen and pelvis was performed following the standard protocol without IV contrast. RADIATION DOSE REDUCTION: This exam was performed according to the departmental dose-optimization program which includes automated exposure control, adjustment of the mA and/or kV according to patient size and/or use of iterative reconstruction technique. COMPARISON:  Abdominal ultrasound 11/23/2022. FINDINGS: CT CHEST FINDINGS Cardiovascular: No significant vascular findings. Normal heart size. No pericardial effusion. Mediastinum/Nodes: No enlarged mediastinal,  hilar, or axillary lymph nodes. Thyroid gland, trachea, and esophagus demonstrate no significant findings. Lungs/Pleura: Lungs are clear. No pleural effusion or pneumothorax. Musculoskeletal: No chest wall mass or suspicious bone lesions identified. CT ABDOMEN PELVIS FINDINGS Hepatobiliary: No focal liver abnormality is seen. Status post cholecystectomy. No biliary dilatation.  Consultations: Nephrology, ID   Procedures/Studies: PERIPHERAL VASCULAR CATHETERIZATION  Result Date: 11/29/2022 Patient name: Deborah [name]  MRN: [id number]        DOB: September 20, 1965          Sex: female  11/29/2022 Pre-operative Diagnosis: Peripheral arterial disease with bilateral heel wounds Post-operative diagnosis:  Same Surgeon:  Daria Pastures, MD Procedure Performed: 1.  Ultrasound-guided access of left common femoral artery 2.  Abdominal aortogram 3.  Second order cannulation of the right external iliac artery 4.  Right lower extremity angiogram 5.  Left lower extremity angiogram via sheath 6.  Pro-glide closure of left common femoral artery 7.  28-minute moderate sedation   Indications: Deborah [name] is a 57 year old female with history of ESRD, A-fib on Eliquis, hypertension, HFpEF, type 2 diabetes who presented with weakness fatigue and multiple episodes of nausea/vomiting's been being treated for sepsis and a possible source of a right heel wound.  ABIs were obtained and demonstrated 0.8 bilaterally with toe pressures in the 60s which are borderline for wound healing.  Risks and benefits of angiogram were reviewed and she was willing to proceed.  Findings: Widely patent aorta, bilateral iliac systems, bilateral common femorals, bilateral SFA and popliteals with three-vessel runoff.  There did appear to be good perfusion to the right heel wound.              Procedure:  The patient was identified in the holding area and taken to room 8.  The patient was then placed supine on the table and prepped and draped in the usual sterile fashion.  A time out was called.  Ultrasound was used to evaluate the left common femoral artery.  It was patent .  A digital ultrasound image was acquired.  A micropuncture needle was used to access the left common femoral artery under ultrasound guidance.  An 018 wire was advanced without resistance and a micropuncture sheath was placed.  The 018 wire was removed and a benson wire was placed.  The micropuncture sheath was exchanged for a 5 french sheath.  An omniflush catheter was advanced over the wire to the level of L-1.  An abdominal angiogram was obtained.  Next, using the omniflush catheter and a stiff angled Glidewire, the aortic bifurcation was crossed and the catheter was placed into the right external iliac artery and right runoff was obtained.  left runoff was performed via retrograde sheath injections.  A Pro-glide Perclose closure device was then used to close the arteriotomy of the left common femoral artery.   ABORTED INVASIVE LAB PROCEDURE  Result Date: 11/29/2022 This case was aborted.  CT CHEST ABDOMEN PELVIS WO CONTRAST  Result Date: 11/25/2022 CLINICAL DATA:  Sepsis EXAM: CT CHEST, ABDOMEN AND PELVIS WITHOUT CONTRAST TECHNIQUE: Multidetector CT imaging of the chest, abdomen and pelvis was performed following the standard protocol without IV contrast. RADIATION DOSE REDUCTION: This exam was performed according to the departmental dose-optimization program which includes automated exposure control, adjustment of the mA and/or kV according to patient size and/or use of iterative reconstruction technique. COMPARISON:  Abdominal ultrasound 11/23/2022. FINDINGS: CT CHEST FINDINGS Cardiovascular: No significant vascular findings. Normal heart size. No pericardial effusion. Mediastinum/Nodes: No enlarged mediastinal,  hilar, or axillary lymph nodes. Thyroid gland, trachea, and esophagus demonstrate no significant findings. Lungs/Pleura: Lungs are clear. No pleural effusion or pneumothorax. Musculoskeletal: No chest wall mass or suspicious bone lesions identified. CT ABDOMEN PELVIS FINDINGS Hepatobiliary: No focal liver abnormality is seen. Status post cholecystectomy. No biliary dilatation.  Pancreas: Unremarkable. No pancreatic ductal dilatation or surrounding inflammatory changes. Spleen: Normal in size without focal abnormality. Adrenals/Urinary Tract: Adrenal glands are unremarkable. Kidneys are normal, without renal calculi, focal lesion, or hydronephrosis. Bladder is unremarkable. Stomach/Bowel: Stomach is within normal limits. Appendix is not confidently identified but there is new inflammatory changes in the right lower quadrant to suggest appendicitis. No evidence of bowel wall thickening, distention, or inflammatory changes. Vascular/Lymphatic: Mild aortic atherosclerosis. No enlarged abdominal or pelvic lymph nodes. Reproductive: Fundal fibroid.  No adnexal mass identified Other: No abdominal wall hernia or abnormality. No abdominopelvic ascites. Musculoskeletal: No acute or significant osseous findings. Lower lumbar degenerative change. IMPRESSION: No evidence of acute abnormality in the chest, abdomen or pelvis on this noncontrast CT. Electronically Signed   By: Feliberto Harts M.D.   On: 11/25/2022 13:20   VAS [place] ABI WITH/WO TBI  Result Date: 11/24/2022  LOWER EXTREMITY DOPPLER STUDY Patient Name:  Deborah [name]  Date of Exam:   11/24/2022 Medical Rec #: [id number]             Accession #:    [id number] Date of Birth: 1965/08/06             Patient Gender: F Patient Age:   23 years Exam Location:  [facility] Procedure:      VAS [place] ABI WITH/WO TBI Referring Phys: Rhetta Mura --------------------------------------------------------------------------------  Indications: Ulceration. High Risk  Factors: None, hypertension, no history of smoking. Other Factors: ESRD (HD), CHF, Afib,.  Limitations: Today's exam was limited due to patient positioning and patient              intolerant to cuff pressure. Comparison Study: No previous exams Performing Technologist: Hill, Jody RVT, RDMS  Examination Guidelines: A complete evaluation includes at minimum, Doppler waveform signals and systolic blood pressure reading at the level of bilateral brachial, anterior tibial, and posterior tibial arteries, when vessel segments are accessible. Bilateral testing is considered an integral part of a complete examination. Photoelectric Plethysmograph (PPG) waveforms and toe systolic pressure readings are included as required and additional duplex testing as needed. Limited examinations for reoccurring indications Deborah be performed as noted.  ABI Findings: +---------+------------------+-----+----------+--------+ Right    Rt Pressure (mmHg)IndexWaveform  Comment  +---------+------------------+-----+----------+--------+ Brachial 113                    triphasic          +---------+------------------+-----+----------+--------+ DP       93                0.82 monophasic         +---------+------------------+-----+----------+--------+ Great Toe64                0.57 Abnormal           +---------+------------------+-----+----------+--------+ +---------+------------------+-----+----------+---------+ Left     Lt Pressure (mmHg)IndexWaveform  Comment   +---------+------------------+-----+----------+---------+ Brachial                                  HD access +---------+------------------+-----+----------+---------+ PTA                             biphasic            +---------+------------------+-----+----------+---------+ DP       91                0.81  Pancreas: Unremarkable. No pancreatic ductal dilatation or surrounding inflammatory changes. Spleen: Normal in size without focal abnormality. Adrenals/Urinary Tract: Adrenal glands are unremarkable. Kidneys are normal, without renal calculi, focal lesion, or hydronephrosis. Bladder is unremarkable. Stomach/Bowel: Stomach is within normal limits. Appendix is not confidently identified but there is new inflammatory changes in the right lower quadrant to suggest appendicitis. No evidence of bowel wall thickening, distention, or inflammatory changes. Vascular/Lymphatic: Mild aortic atherosclerosis. No enlarged abdominal or pelvic lymph nodes. Reproductive: Fundal fibroid.  No adnexal mass identified Other: No abdominal wall hernia or abnormality. No abdominopelvic ascites. Musculoskeletal: No acute or significant osseous findings. Lower lumbar degenerative change. IMPRESSION: No evidence of acute abnormality in the chest, abdomen or pelvis on this noncontrast CT. Electronically Signed   By: Feliberto Harts M.D.   On: 11/25/2022 13:20   VAS [place] ABI WITH/WO TBI  Result Date: 11/24/2022  LOWER EXTREMITY DOPPLER STUDY Patient Name:  Deborah [name]  Date of Exam:   11/24/2022 Medical Rec #: [id number]             Accession #:    [id number] Date of Birth: 1965/08/06             Patient Gender: F Patient Age:   23 years Exam Location:  [facility] Procedure:      VAS [place] ABI WITH/WO TBI Referring Phys: Rhetta Mura --------------------------------------------------------------------------------  Indications: Ulceration. High Risk  Factors: None, hypertension, no history of smoking. Other Factors: ESRD (HD), CHF, Afib,.  Limitations: Today's exam was limited due to patient positioning and patient              intolerant to cuff pressure. Comparison Study: No previous exams Performing Technologist: Hill, Jody RVT, RDMS  Examination Guidelines: A complete evaluation includes at minimum, Doppler waveform signals and systolic blood pressure reading at the level of bilateral brachial, anterior tibial, and posterior tibial arteries, when vessel segments are accessible. Bilateral testing is considered an integral part of a complete examination. Photoelectric Plethysmograph (PPG) waveforms and toe systolic pressure readings are included as required and additional duplex testing as needed. Limited examinations for reoccurring indications Deborah be performed as noted.  ABI Findings: +---------+------------------+-----+----------+--------+ Right    Rt Pressure (mmHg)IndexWaveform  Comment  +---------+------------------+-----+----------+--------+ Brachial 113                    triphasic          +---------+------------------+-----+----------+--------+ DP       93                0.82 monophasic         +---------+------------------+-----+----------+--------+ Great Toe64                0.57 Abnormal           +---------+------------------+-----+----------+--------+ +---------+------------------+-----+----------+---------+ Left     Lt Pressure (mmHg)IndexWaveform  Comment   +---------+------------------+-----+----------+---------+ Brachial                                  HD access +---------+------------------+-----+----------+---------+ PTA                             biphasic            +---------+------------------+-----+----------+---------+ DP       91                0.81  Pancreas: Unremarkable. No pancreatic ductal dilatation or surrounding inflammatory changes. Spleen: Normal in size without focal abnormality. Adrenals/Urinary Tract: Adrenal glands are unremarkable. Kidneys are normal, without renal calculi, focal lesion, or hydronephrosis. Bladder is unremarkable. Stomach/Bowel: Stomach is within normal limits. Appendix is not confidently identified but there is new inflammatory changes in the right lower quadrant to suggest appendicitis. No evidence of bowel wall thickening, distention, or inflammatory changes. Vascular/Lymphatic: Mild aortic atherosclerosis. No enlarged abdominal or pelvic lymph nodes. Reproductive: Fundal fibroid.  No adnexal mass identified Other: No abdominal wall hernia or abnormality. No abdominopelvic ascites. Musculoskeletal: No acute or significant osseous findings. Lower lumbar degenerative change. IMPRESSION: No evidence of acute abnormality in the chest, abdomen or pelvis on this noncontrast CT. Electronically Signed   By: Feliberto Harts M.D.   On: 11/25/2022 13:20   VAS [place] ABI WITH/WO TBI  Result Date: 11/24/2022  LOWER EXTREMITY DOPPLER STUDY Patient Name:  Deborah [name]  Date of Exam:   11/24/2022 Medical Rec #: [id number]             Accession #:    [id number] Date of Birth: 1965/08/06             Patient Gender: F Patient Age:   23 years Exam Location:  [facility] Procedure:      VAS [place] ABI WITH/WO TBI Referring Phys: Rhetta Mura --------------------------------------------------------------------------------  Indications: Ulceration. High Risk  Factors: None, hypertension, no history of smoking. Other Factors: ESRD (HD), CHF, Afib,.  Limitations: Today's exam was limited due to patient positioning and patient              intolerant to cuff pressure. Comparison Study: No previous exams Performing Technologist: Hill, Jody RVT, RDMS  Examination Guidelines: A complete evaluation includes at minimum, Doppler waveform signals and systolic blood pressure reading at the level of bilateral brachial, anterior tibial, and posterior tibial arteries, when vessel segments are accessible. Bilateral testing is considered an integral part of a complete examination. Photoelectric Plethysmograph (PPG) waveforms and toe systolic pressure readings are included as required and additional duplex testing as needed. Limited examinations for reoccurring indications Deborah be performed as noted.  ABI Findings: +---------+------------------+-----+----------+--------+ Right    Rt Pressure (mmHg)IndexWaveform  Comment  +---------+------------------+-----+----------+--------+ Brachial 113                    triphasic          +---------+------------------+-----+----------+--------+ DP       93                0.82 monophasic         +---------+------------------+-----+----------+--------+ Great Toe64                0.57 Abnormal           +---------+------------------+-----+----------+--------+ +---------+------------------+-----+----------+---------+ Left     Lt Pressure (mmHg)IndexWaveform  Comment   +---------+------------------+-----+----------+---------+ Brachial                                  HD access +---------+------------------+-----+----------+---------+ PTA                             biphasic            +---------+------------------+-----+----------+---------+ DP       91                0.81  Consultations: Nephrology, ID   Procedures/Studies: PERIPHERAL VASCULAR CATHETERIZATION  Result Date: 11/29/2022 Patient name: Deborah [name]  MRN: [id number]        DOB: September 20, 1965          Sex: female  11/29/2022 Pre-operative Diagnosis: Peripheral arterial disease with bilateral heel wounds Post-operative diagnosis:  Same Surgeon:  Daria Pastures, MD Procedure Performed: 1.  Ultrasound-guided access of left common femoral artery 2.  Abdominal aortogram 3.  Second order cannulation of the right external iliac artery 4.  Right lower extremity angiogram 5.  Left lower extremity angiogram via sheath 6.  Pro-glide closure of left common femoral artery 7.  28-minute moderate sedation   Indications: Deborah [name] is a 57 year old female with history of ESRD, A-fib on Eliquis, hypertension, HFpEF, type 2 diabetes who presented with weakness fatigue and multiple episodes of nausea/vomiting's been being treated for sepsis and a possible source of a right heel wound.  ABIs were obtained and demonstrated 0.8 bilaterally with toe pressures in the 60s which are borderline for wound healing.  Risks and benefits of angiogram were reviewed and she was willing to proceed.  Findings: Widely patent aorta, bilateral iliac systems, bilateral common femorals, bilateral SFA and popliteals with three-vessel runoff.  There did appear to be good perfusion to the right heel wound.              Procedure:  The patient was identified in the holding area and taken to room 8.  The patient was then placed supine on the table and prepped and draped in the usual sterile fashion.  A time out was called.  Ultrasound was used to evaluate the left common femoral artery.  It was patent .  A digital ultrasound image was acquired.  A micropuncture needle was used to access the left common femoral artery under ultrasound guidance.  An 018 wire was advanced without resistance and a micropuncture sheath was placed.  The 018 wire was removed and a benson wire was placed.  The micropuncture sheath was exchanged for a 5 french sheath.  An omniflush catheter was advanced over the wire to the level of L-1.  An abdominal angiogram was obtained.  Next, using the omniflush catheter and a stiff angled Glidewire, the aortic bifurcation was crossed and the catheter was placed into the right external iliac artery and right runoff was obtained.  left runoff was performed via retrograde sheath injections.  A Pro-glide Perclose closure device was then used to close the arteriotomy of the left common femoral artery.   ABORTED INVASIVE LAB PROCEDURE  Result Date: 11/29/2022 This case was aborted.  CT CHEST ABDOMEN PELVIS WO CONTRAST  Result Date: 11/25/2022 CLINICAL DATA:  Sepsis EXAM: CT CHEST, ABDOMEN AND PELVIS WITHOUT CONTRAST TECHNIQUE: Multidetector CT imaging of the chest, abdomen and pelvis was performed following the standard protocol without IV contrast. RADIATION DOSE REDUCTION: This exam was performed according to the departmental dose-optimization program which includes automated exposure control, adjustment of the mA and/or kV according to patient size and/or use of iterative reconstruction technique. COMPARISON:  Abdominal ultrasound 11/23/2022. FINDINGS: CT CHEST FINDINGS Cardiovascular: No significant vascular findings. Normal heart size. No pericardial effusion. Mediastinum/Nodes: No enlarged mediastinal,  hilar, or axillary lymph nodes. Thyroid gland, trachea, and esophagus demonstrate no significant findings. Lungs/Pleura: Lungs are clear. No pleural effusion or pneumothorax. Musculoskeletal: No chest wall mass or suspicious bone lesions identified. CT ABDOMEN PELVIS FINDINGS Hepatobiliary: No focal liver abnormality is seen. Status post cholecystectomy. No biliary dilatation.  Consultations: Nephrology, ID   Procedures/Studies: PERIPHERAL VASCULAR CATHETERIZATION  Result Date: 11/29/2022 Patient name: Deborah [name]  MRN: [id number]        DOB: September 20, 1965          Sex: female  11/29/2022 Pre-operative Diagnosis: Peripheral arterial disease with bilateral heel wounds Post-operative diagnosis:  Same Surgeon:  Daria Pastures, MD Procedure Performed: 1.  Ultrasound-guided access of left common femoral artery 2.  Abdominal aortogram 3.  Second order cannulation of the right external iliac artery 4.  Right lower extremity angiogram 5.  Left lower extremity angiogram via sheath 6.  Pro-glide closure of left common femoral artery 7.  28-minute moderate sedation   Indications: Deborah [name] is a 57 year old female with history of ESRD, A-fib on Eliquis, hypertension, HFpEF, type 2 diabetes who presented with weakness fatigue and multiple episodes of nausea/vomiting's been being treated for sepsis and a possible source of a right heel wound.  ABIs were obtained and demonstrated 0.8 bilaterally with toe pressures in the 60s which are borderline for wound healing.  Risks and benefits of angiogram were reviewed and she was willing to proceed.  Findings: Widely patent aorta, bilateral iliac systems, bilateral common femorals, bilateral SFA and popliteals with three-vessel runoff.  There did appear to be good perfusion to the right heel wound.              Procedure:  The patient was identified in the holding area and taken to room 8.  The patient was then placed supine on the table and prepped and draped in the usual sterile fashion.  A time out was called.  Ultrasound was used to evaluate the left common femoral artery.  It was patent .  A digital ultrasound image was acquired.  A micropuncture needle was used to access the left common femoral artery under ultrasound guidance.  An 018 wire was advanced without resistance and a micropuncture sheath was placed.  The 018 wire was removed and a benson wire was placed.  The micropuncture sheath was exchanged for a 5 french sheath.  An omniflush catheter was advanced over the wire to the level of L-1.  An abdominal angiogram was obtained.  Next, using the omniflush catheter and a stiff angled Glidewire, the aortic bifurcation was crossed and the catheter was placed into the right external iliac artery and right runoff was obtained.  left runoff was performed via retrograde sheath injections.  A Pro-glide Perclose closure device was then used to close the arteriotomy of the left common femoral artery.   ABORTED INVASIVE LAB PROCEDURE  Result Date: 11/29/2022 This case was aborted.  CT CHEST ABDOMEN PELVIS WO CONTRAST  Result Date: 11/25/2022 CLINICAL DATA:  Sepsis EXAM: CT CHEST, ABDOMEN AND PELVIS WITHOUT CONTRAST TECHNIQUE: Multidetector CT imaging of the chest, abdomen and pelvis was performed following the standard protocol without IV contrast. RADIATION DOSE REDUCTION: This exam was performed according to the departmental dose-optimization program which includes automated exposure control, adjustment of the mA and/or kV according to patient size and/or use of iterative reconstruction technique. COMPARISON:  Abdominal ultrasound 11/23/2022. FINDINGS: CT CHEST FINDINGS Cardiovascular: No significant vascular findings. Normal heart size. No pericardial effusion. Mediastinum/Nodes: No enlarged mediastinal,  hilar, or axillary lymph nodes. Thyroid gland, trachea, and esophagus demonstrate no significant findings. Lungs/Pleura: Lungs are clear. No pleural effusion or pneumothorax. Musculoskeletal: No chest wall mass or suspicious bone lesions identified. CT ABDOMEN PELVIS FINDINGS Hepatobiliary: No focal liver abnormality is seen. Status post cholecystectomy. No biliary dilatation.

## 2022-12-01 ENCOUNTER — Other Ambulatory Visit: Payer: Self-pay | Admitting: Physical Medicine and Rehabilitation

## 2022-12-01 ENCOUNTER — Other Ambulatory Visit (HOSPITAL_COMMUNITY): Payer: Self-pay

## 2022-12-01 LAB — TYPE AND SCREEN
ABO/RH(D): O POS
Antibody Screen: NEGATIVE
Donor AG Type: NEGATIVE
Donor AG Type: NEGATIVE
Unit division: 0
Unit division: 0

## 2022-12-01 LAB — BPAM RBC
Blood Product Expiration Date: 202410062359
Blood Product Expiration Date: 202410152359
Unit Type and Rh: 5100
Unit Type and Rh: 5100

## 2022-12-01 IMAGING — CT CT ABD-PELV W/O CM
2 of 4 series · 17 of 46 positions shown, 19 images · non-contrast
Comparison: CT abdomen pelvis 09/14/2020.

CLINICAL DATA: Bowel obstruction suspected.  Abd pain, n/v,

EXAM:
CT ABDOMEN AND PELVIS WITHOUT CONTRAST
TECHNIQUE: Multidetector CT imaging of the abdomen and pelvis was performed
following the standard protocol without IV contrast.

[Series 2: axial st · axial · 0.91mm/px · z∈[+1251,+1661]mm · 14 of 92 slices shown, 16 images]
[im 5/92  soft-tissue]
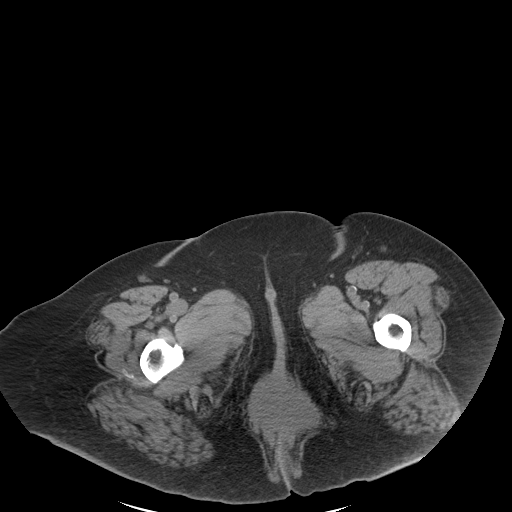
[im 5/92  bone]
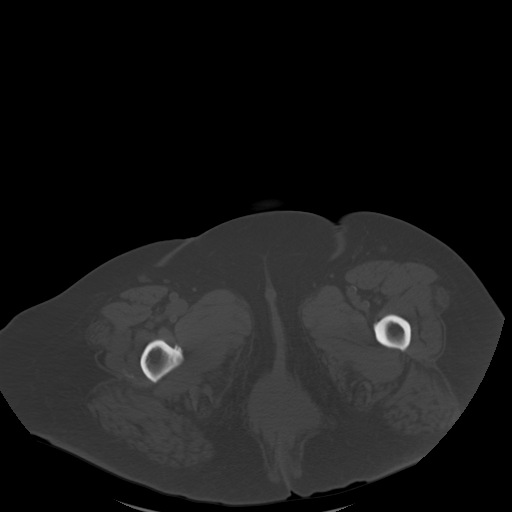
[im 14/92  soft-tissue]
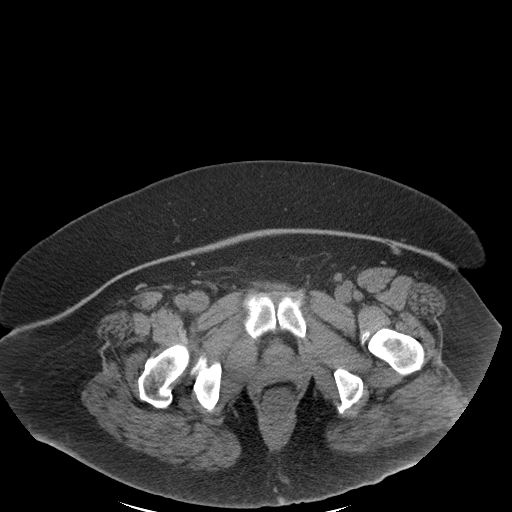
[im 19/92  soft-tissue]
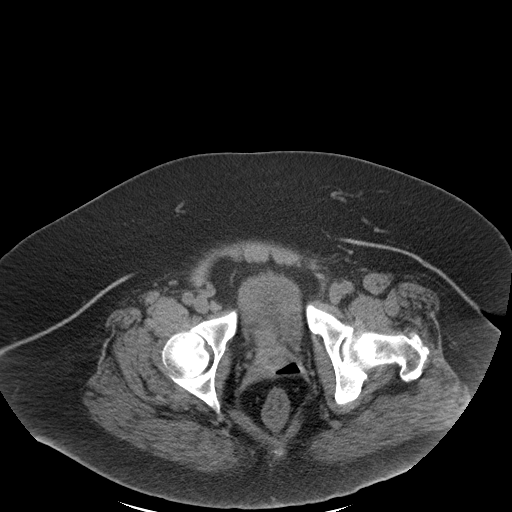
[im 23/92  soft-tissue]
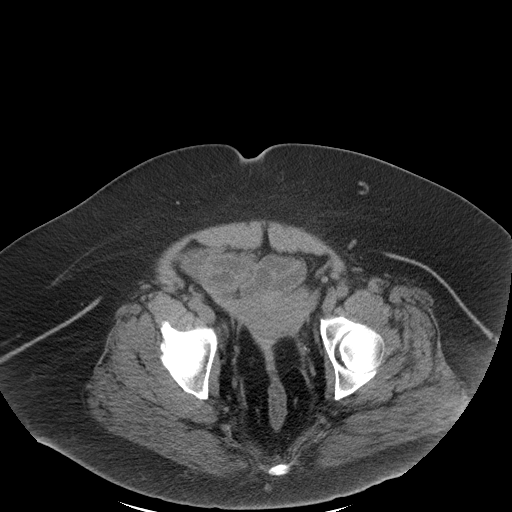
[im 32/92  soft-tissue]
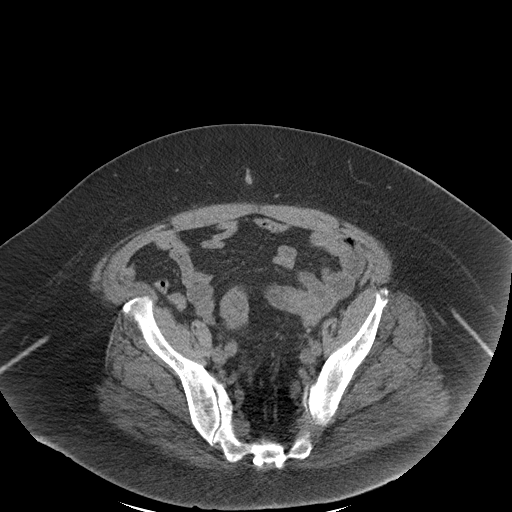
[im 37/92  soft-tissue]
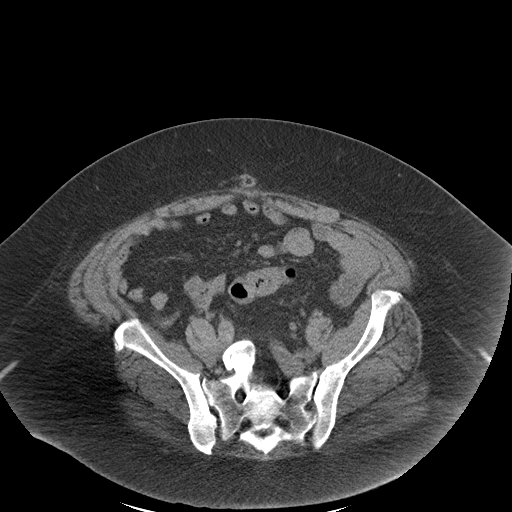
[im 41/92  soft-tissue]
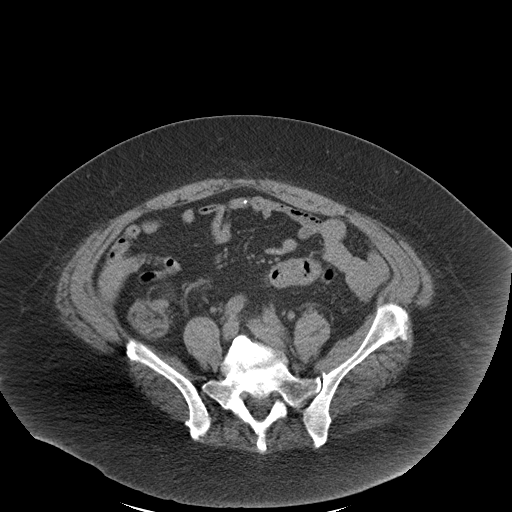
[im 51/92  soft-tissue]
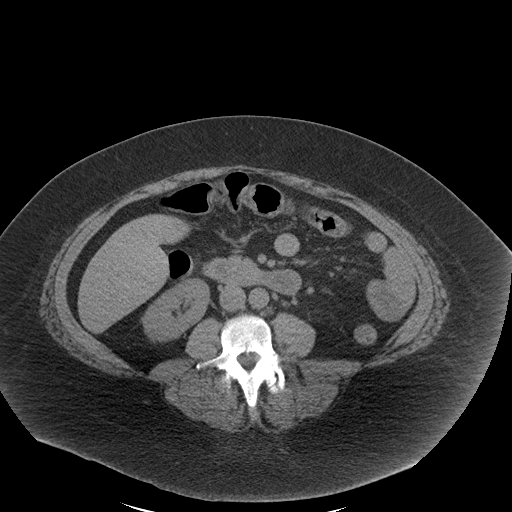
[im 55/92  soft-tissue]
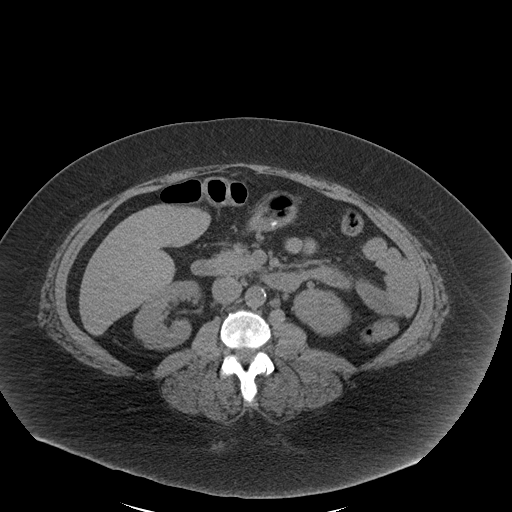
[im 55/92  bone]
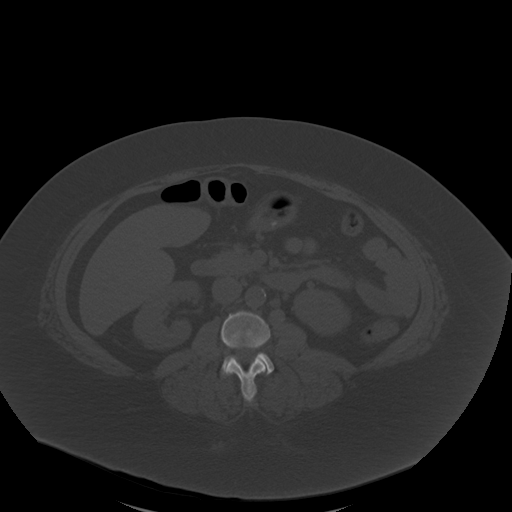
[im 60/92  soft-tissue]
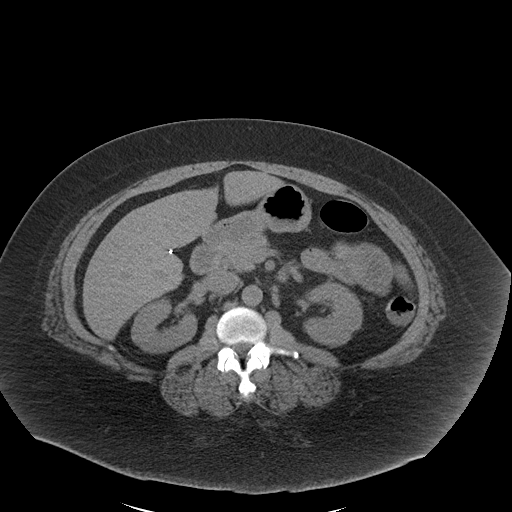
[im 69/92  soft-tissue]
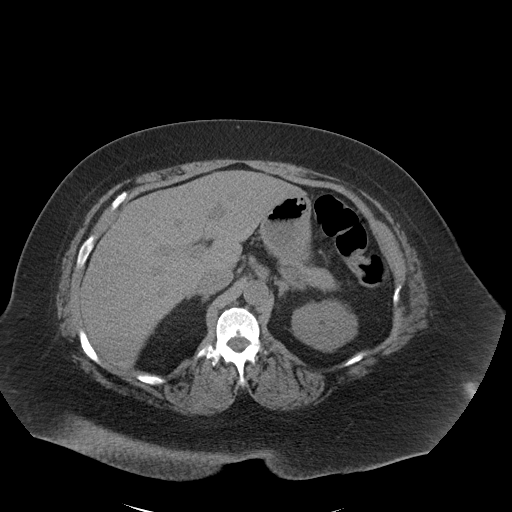
[im 73/92  soft-tissue]
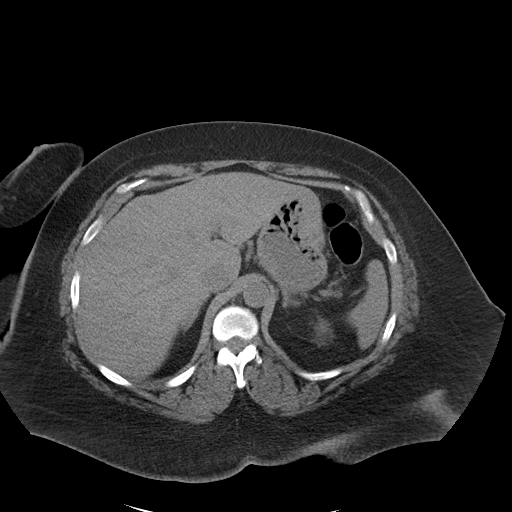
[im 78/92  soft-tissue]
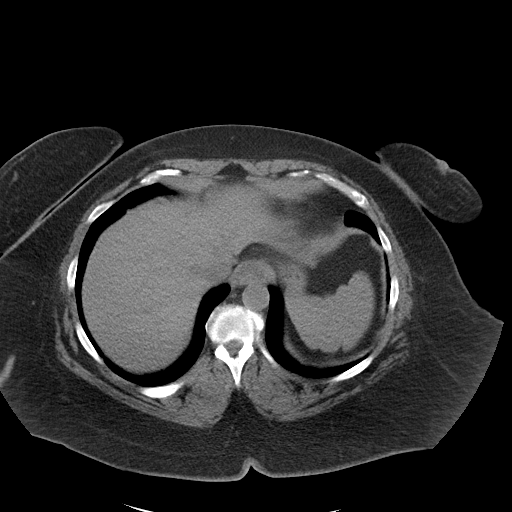
[im 87/92  soft-tissue]
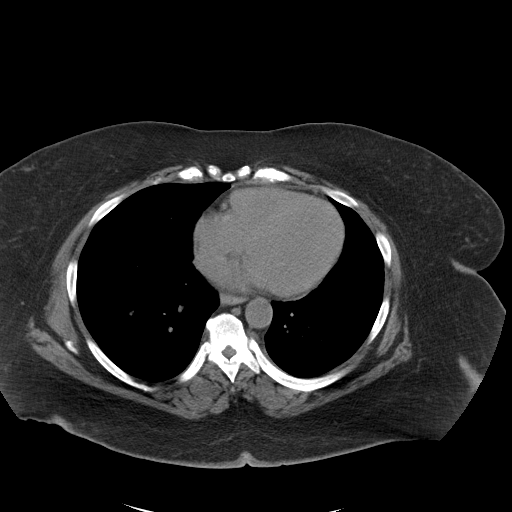

[Series 4: coronal st · coronal · 0.97mm/px · 3 of 184 slices shown]
[im 62/184  soft-tissue]
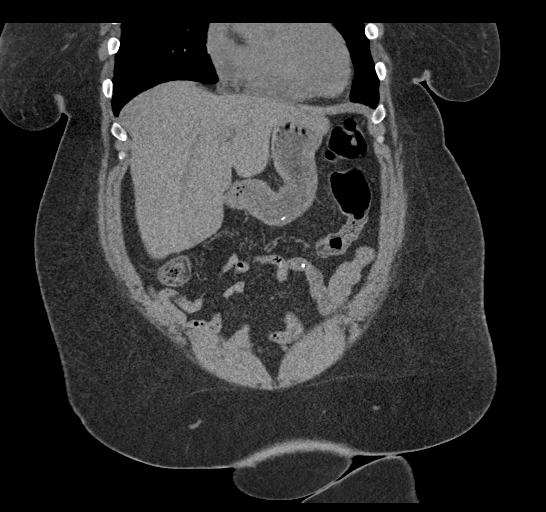
[im 82/184  soft-tissue]
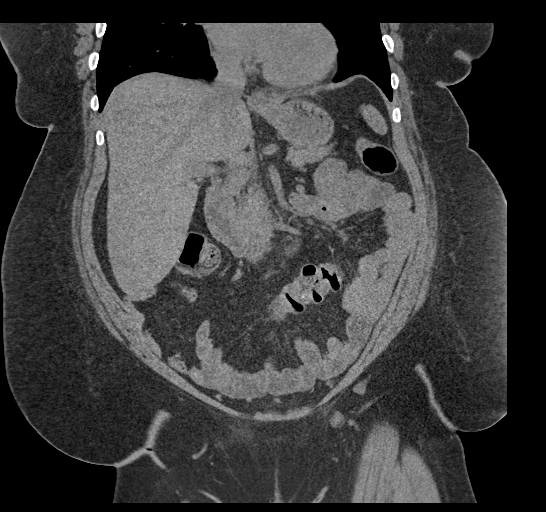
[im 102/184  soft-tissue]
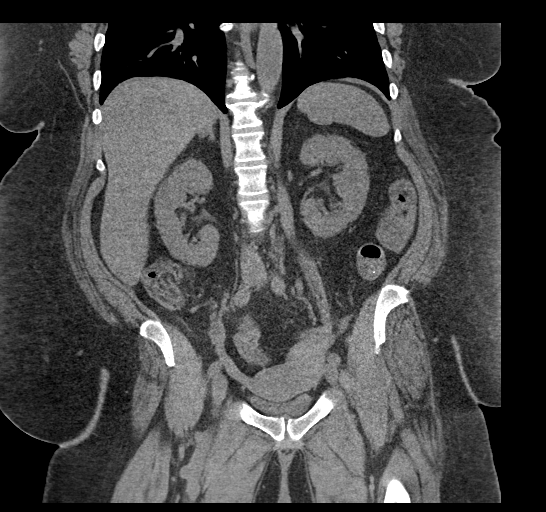

[17 of 46 positions shown; findings below may reference images not displayed]

FINDINGS: Lower chest: No acute abnormality.

Hepatobiliary: The liver is enlarged measuring up to 21.5 Cm. No
focal liver abnormality. Status post cholecystectomy. No biliary
dilatation.

Pancreas: No focal lesion. Normal pancreatic contour. No surrounding
inflammatory changes. No main pancreatic ductal dilatation.

Spleen: Normal in size without focal abnormality.

Adrenals/Urinary Tract:

No adrenal nodule bilaterally.

Bilateral kidneys enhance symmetrically.

No hydronephrosis. No hydroureter.

The urinary bladder is unremarkable.

Stomach/Bowel: Stomach is within normal limits. No evidence of bowel
wall thickening or dilatation. Appendix appears normal.

Vascular/Lymphatic: No abdominal aorta or iliac aneurysm. Mild
atherosclerotic plaque of the aorta and its branches. No abdominal,
pelvic, or inguinal lymphadenopathy.

Reproductive: Uterus and bilateral adnexa are unremarkable.

Other: No intraperitoneal free fluid. No intraperitoneal free gas.
No organized fluid collection.

Musculoskeletal:

No abdominal wall hernia or abnormality.

No suspicious lytic or blastic osseous lesions. No acute displaced
fracture. Severe degenerative changes of the L5-S1 level. Grade 1
anterolisthesis of L4 on L5.
IMPRESSION: 1. No acute intra-abdominal or intrapelvic abnormality with limited
evaluation on this noncontrast study.
2. Hepatomegaly.
3.  Aortic Atherosclerosis ([id number]).

## 2022-12-02 ENCOUNTER — Telehealth: Payer: Self-pay | Admitting: Nephrology

## 2022-12-02 ENCOUNTER — Ambulatory Visit: Payer: 59 | Admitting: Orthopedic Surgery

## 2022-12-02 NOTE — Telephone Encounter (Signed)
Transition of Care - Initial Contact from Inpatient Facility  Date of discharge: 11/30/22 Date of contact: 12/02/22  Method: Phone Spoke to: Patient  Patient contacted to discuss transition of care from recent inpatient hospitalization. Patient was admitted to [facility] from 9/16 -11/30/22  with discharge diagnosis of right ankle ulcer.   The discharge medication list was reviewed. Patient understands the changes and has no concerns.   Patient will return to his/her outpatient HD unit on: Friday 9/27   No other concerns at this time.

## 2022-12-07 ENCOUNTER — Telehealth: Payer: Self-pay

## 2022-12-07 ENCOUNTER — Encounter: Payer: Self-pay | Admitting: Orthopedic Surgery

## 2022-12-07 ENCOUNTER — Ambulatory Visit (INDEPENDENT_AMBULATORY_CARE_PROVIDER_SITE_OTHER): Payer: 59 | Admitting: Orthopedic Surgery

## 2022-12-07 DIAGNOSIS — L97411 Non-pressure chronic ulcer of right heel and midfoot limited to breakdown of skin: Secondary | ICD-10-CM | POA: Diagnosis not present

## 2022-12-07 NOTE — Progress Notes (Signed)
Office Visit Note   Patient: Deborah [name]           Date of Birth: 1965/06/11           MRN: [id number] Visit Date: 12/07/2022              Requested by: No referring provider defined for this encounter. PCP: Pcp, No  Chief Complaint  Patient presents with   Right Foot - Wound Check      HPI: Patient is a 57 year old woman who presents in follow-up for bilateral decubitus heel ulcers.  She is wearing PRAFO's at home and using Medihoney dressing changes.  She has completed her course of doxycycline.  Assessment & Plan: Visit Diagnoses:  1. Non-pressure chronic ulcer of right heel and midfoot limited to breakdown of skin (HCC)     Plan: Continue with current wound care to the right heel with Medihoney dry dressing and PRAFO's bilaterally.  Follow-Up Instructions: Return in about 4 weeks (around 01/04/2023).   Ortho Exam  Patient is alert, oriented, no adenopathy, well-dressed, normal affect, normal respiratory effort. Examination patient has decreased swelling in both legs she now has wrinkling of the skin.  The left heel decubitus ulcer is completely healed with good epithelization.  On the right heel there is good epithelization very thin eschar that is 2 x 4 cm that is flat with no drainage no cellulitis no tenderness to palpation.  Imaging: No results found. No images are attached to the encounter.  Labs: Lab Results  Component Value Date   HGBA1C 8.1 (H) 06/19/2022   HGBA1C 10.1 (H) 03/21/2022   HGBA1C 7.5 (H) 08/16/2021   ESRSEDRATE 122 (H) 11/24/2022   CRP 2.7 (H) 11/30/2022   CRP 3.8 (H) 11/29/2022   CRP 5.5 (H) 11/28/2022   LABURIC 6.7 11/28/2022   LABURIC 6.2 07/04/2022   LABURIC 8.0 (H) 06/20/2022   REPTSTATUS 11/28/2022 FINAL 11/23/2022   CULT  11/23/2022    NO GROWTH 5 DAYS Performed at [facility], [address], [place], [place] [address]    LABORGA ESCHERICHIA COLI (A) 08/15/2021     Lab Results  Component Value Date    ALBUMIN 2.7 (L) 11/30/2022   ALBUMIN 2.8 (L) 11/29/2022   ALBUMIN 2.3 (L) 11/29/2022    Lab Results  Component Value Date   MG 1.7 11/30/2022   MG 2.3 11/29/2022   MG 1.5 (L) 11/28/2022   No results found for: "VD25OH"  No results found for: "PREALBUMIN"    Latest Ref Rng & Units 11/29/2022   11:00 PM 11/29/2022    5:48 PM 11/29/2022    5:42 AM  CBC EXTENDED  WBC 4.0 - 10.5 K/uL 21.8  19.6  20.3   RBC 3.87 - 5.11 MIL/uL 2.78  3.21  2.72   Hemoglobin 12.0 - 15.0 g/dL 7.5  8.6  7.3   HCT 03.4 - 46.0 % 26.2  29.9  25.2   Platelets 150 - 400 K/uL 479  537  450      There is no height or weight on file to calculate BMI.  Orders:  No orders of the defined types were placed in this encounter.  No orders of the defined types were placed in this encounter.    Procedures: No procedures performed  Clinical Data: No additional findings.  ROS:  All other systems negative, except as noted in the HPI. Review of Systems  Objective: Vital Signs: LMP 10/10/2012   Specialty Comments:  No specialty  comments available.  PMFS History: Patient Active Problem List   Diagnosis Date Noted   Pressure injury of skin 11/30/2022   Non-pressure chronic ulcer of right heel and midfoot limited to breakdown of skin (HCC) 11/24/2022   Sepsis (HCC) 11/23/2022   Elevated troponin 09/09/2022   Nausea & vomiting 09/09/2022   End stage renal disease on dialysis (HCC) 09/09/2022   Chronic a-fib (HCC) 09/09/2022   Atrial fibrillation with RVR (HCC)-resolved 09/09/2022   Peripheral neuropathy 09/09/2022   (HFpEF) heart failure with preserved ejection fraction (HCC) 09/09/2022   Hypotension 09/09/2022   CHF (congestive heart failure) (HCC) 07/13/2022   HCAP (healthcare-associated pneumonia) 06/19/2022   Acute encephalopathy 05/29/2022   Hyponatremia 05/29/2022   Chronic anemia 05/22/2022   Hyperosmolar hyperglycemic state (HHS) (HCC) 05/11/2022   Hyperosmolar non-ketotic state due to type 2  diabetes mellitus (HCC) 05/11/2022   Leukocytosis 05/11/2022   Edema of left upper extremity 03/21/2022   Tachycardia 03/18/2022   Atypical chest pain 09/10/2021   Acute pyelonephritis    PAF (paroxysmal atrial fibrillation) (HCC)    Chronic pain 09/04/2019   Malignant carcinoid tumor of duodenum (HCC)    Nonalcoholic fatty liver disease 06/05/2019   Restrictive lung disease secondary to obesity    History of gastric ulcer    Fibroid uterus 02/23/2019   Abdominal pain 07/17/2018   Anxiety 11/29/2017   Spinal stenosis, lumbar region with neurogenic claudication 08/03/2017   GERD (gastroesophageal reflux disease) 03/19/2017   Depression 03/19/2017   Morbid obesity (HCC)    Normocytic anemia 08/16/2016   Diabetic gastroparesis (HCC) 08/16/2016   Gout 06/05/2016   Chronic hypokalemia 09/26/2015   Hypomagnesemia and hypokalemia 09/26/2015   Insulin dependent type 2 diabetes mellitus (HCC) 05/25/2015   Essential hypertension 09/28/2013   Past Medical History:  Diagnosis Date   Acute back pain with sciatica, left    Acute back pain with sciatica, right    AKI (acute kidney injury) (HCC)    Anemia, unspecified    Cancer (HCC)    Carcinoid tumor of duodenum    Chest pain with normal coronary angiography 2019   Chronic kidney disease, stage 3b (HCC)    Chronic pain    Chronic systolic CHF (congestive heart failure) (HCC)    Diabetes mellitus    DKA (diabetic ketoacidosis) (HCC)    Drug-seeking behavior    21 hospitalizations and 14 CT a/p in 2 years for N/V and abdominal pain, demanding only IV dilaudid   Elevated troponin    chronic   Esophageal reflux    Fibromyalgia    Gastric ulcer    Gastroparesis    Gout    Hyperlipidemia    Hypertension    Hypokalemia    Hypomagnesemia    Lumbosacral stenosis    LVH (left ventricular hypertrophy)    Morbid obesity (HCC)    NICM (nonischemic cardiomyopathy) (HCC)    PAF (paroxysmal atrial fibrillation) (HCC)    Stroke (HCC)  02/2011   Thrombocytosis    Vitamin B12 deficiency anemia     Family History  Problem Relation Age of Onset   Diabetes Mother    Diabetes Father    Heart disease Father    Diabetes Sister    Congestive Heart Failure Sister 49   Diabetes Brother     Past Surgical History:  Procedure Laterality Date   ABDOMINAL AORTOGRAM W/LOWER EXTREMITY N/A 11/29/2022   Procedure: ABDOMINAL AORTOGRAM W/LOWER EXTREMITY;  Surgeon: Daria Pastures, MD;  Location: [facility];  Service: Cardiovascular;  Laterality: N/A;   AV FISTULA PLACEMENT Left 06/30/2022   Procedure: LEFT BRACHIOCEPHALIC ARTERIOVENOUS (AV) FISTULA CREATION;  Surgeon: Cephus Shelling, MD;  Location: [facility] OR;  Service: Vascular;  Laterality: Left;   BIOPSY  07/27/2019   Procedure: BIOPSY;  Surgeon: Vida Rigger, MD;  Location: [facility] ENDOSCOPY;  Service: Endoscopy;;   BIOPSY  07/30/2019   Procedure: BIOPSY;  Surgeon: Kathi Der, MD;  Location: [facility] END[facility];  Service: Gastroenterology;;   CATARACT EXTRACTION  01/2014   CHOLECYSTECTOMY     COLONOSCOPY WITH PROPOFOL N/A 07/30/2019   Procedure: COLONOSCOPY WITH PROPOFOL;  Surgeon: Kathi Der, MD;  Location: [facility];  Service: Gastroenterology;  Laterality: N/A;   ESOPHAGOGASTRODUODENOSCOPY N/A 07/27/2019   Procedure: ESOPHAGOGASTRODUODENOSCOPY (EGD);  Surgeon: Vida Rigger, MD;  Location: [facility];  Service: Endoscopy;  Laterality: N/A;   ESOPHAGOGASTRODUODENOSCOPY N/A 07/26/2020   Procedure: ESOPHAGOGASTRODUODENOSCOPY (EGD);  Surgeon: Willis Modena, MD;  Location: [facility];  Service: Endoscopy;  Laterality: N/A;   ESOPHAGOGASTRODUODENOSCOPY (EGD) WITH PROPOFOL N/A 08/02/2019   Procedure: ESOPHAGOGASTRODUODENOSCOPY (EGD) WITH PROPOFOL;  Surgeon: Kathi Der, MD;  Location: [facility];  Service: Gastroenterology;  Laterality: N/A;   HEMOSTASIS CLIP PLACEMENT  08/02/2019   Procedure: HEMOSTASIS CLIP PLACEMENT;  Surgeon: Kathi Der, MD;  Location: [facility]  ENDOSCOPY;  Service: Gastroenterology;;   IR FLUORO GUIDE CV LINE RIGHT  06/24/2022   IR US GUIDE VASC ACCESS RIGHT  06/24/2022   POLYPECTOMY  07/30/2019   Procedure: POLYPECTOMY;  Surgeon: Kathi Der, MD;  Location: [facility];  Service: Gastroenterology;;   POLYPECTOMY  08/02/2019   Procedure: POLYPECTOMY;  Surgeon: Kathi Der, MD;  Location: [facility];  Service: Gastroenterology;;   Social History   Occupational History   Not on file  Tobacco Use   Smoking status: Never   Smokeless tobacco: Never  Vaping Use   Vaping status: Never Used  Substance and Sexual Activity   Alcohol use: No   Drug use: No   Sexual activity: Not Currently    Birth control/protection: None

## 2022-12-07 NOTE — Telephone Encounter (Signed)
Fax from pharmacy Hydromorphone is on back order.

## 2022-12-14 ENCOUNTER — Emergency Department (HOSPITAL_COMMUNITY): Payer: 59

## 2022-12-14 ENCOUNTER — Encounter (HOSPITAL_COMMUNITY): Payer: Self-pay

## 2022-12-14 ENCOUNTER — Inpatient Hospital Stay (HOSPITAL_COMMUNITY)
Admission: EM | Admit: 2022-12-14 | Discharge: 2023-01-10 | DRG: 616 | Disposition: A | Discharge status: Skilled Nursing Facility | Payer: 59 | Attending: Internal Medicine | Admitting: Internal Medicine

## 2022-12-14 ENCOUNTER — Other Ambulatory Visit: Payer: Self-pay

## 2022-12-14 DIAGNOSIS — K3184 Gastroparesis: Secondary | ICD-10-CM | POA: Diagnosis not present

## 2022-12-14 DIAGNOSIS — L89152 Pressure ulcer of sacral region, stage 2: Secondary | ICD-10-CM | POA: Diagnosis present

## 2022-12-14 DIAGNOSIS — D638 Anemia in other chronic diseases classified elsewhere: Secondary | ICD-10-CM

## 2022-12-14 DIAGNOSIS — K529 Noninfective gastroenteritis and colitis, unspecified: Secondary | ICD-10-CM | POA: Diagnosis present

## 2022-12-14 DIAGNOSIS — Z8673 Personal history of transient ischemic attack (TIA), and cerebral infarction without residual deficits: Secondary | ICD-10-CM

## 2022-12-14 DIAGNOSIS — Z7401 Bed confinement status: Secondary | ICD-10-CM

## 2022-12-14 DIAGNOSIS — E871 Hypo-osmolality and hyponatremia: Secondary | ICD-10-CM | POA: Diagnosis present

## 2022-12-14 DIAGNOSIS — I428 Other cardiomyopathies: Secondary | ICD-10-CM | POA: Diagnosis present

## 2022-12-14 DIAGNOSIS — N2581 Secondary hyperparathyroidism of renal origin: Secondary | ICD-10-CM | POA: Diagnosis present

## 2022-12-14 DIAGNOSIS — I5030 Unspecified diastolic (congestive) heart failure: Secondary | ICD-10-CM | POA: Diagnosis present

## 2022-12-14 DIAGNOSIS — Z992 Dependence on renal dialysis: Secondary | ICD-10-CM | POA: Diagnosis not present

## 2022-12-14 DIAGNOSIS — K2971 Gastritis, unspecified, with bleeding: Secondary | ICD-10-CM | POA: Diagnosis present

## 2022-12-14 DIAGNOSIS — Z91041 Radiographic dye allergy status: Secondary | ICD-10-CM

## 2022-12-14 DIAGNOSIS — Z79899 Other long term (current) drug therapy: Secondary | ICD-10-CM

## 2022-12-14 DIAGNOSIS — L89892 Pressure ulcer of other site, stage 2: Secondary | ICD-10-CM | POA: Diagnosis present

## 2022-12-14 DIAGNOSIS — M869 Osteomyelitis, unspecified: Secondary | ICD-10-CM | POA: Diagnosis not present

## 2022-12-14 DIAGNOSIS — K31811 Angiodysplasia of stomach and duodenum with bleeding: Secondary | ICD-10-CM | POA: Diagnosis present

## 2022-12-14 DIAGNOSIS — Z885 Allergy status to narcotic agent status: Secondary | ICD-10-CM

## 2022-12-14 DIAGNOSIS — N186 End stage renal disease: Secondary | ICD-10-CM | POA: Diagnosis present

## 2022-12-14 DIAGNOSIS — I120 Hypertensive chronic kidney disease with stage 5 chronic kidney disease or end stage renal disease: Secondary | ICD-10-CM | POA: Diagnosis not present

## 2022-12-14 DIAGNOSIS — K2101 Gastro-esophageal reflux disease with esophagitis, with bleeding: Secondary | ICD-10-CM | POA: Diagnosis present

## 2022-12-14 DIAGNOSIS — Q8589 Other phakomatoses, not elsewhere classified: Secondary | ICD-10-CM

## 2022-12-14 DIAGNOSIS — T40605A Adverse effect of unspecified narcotics, initial encounter: Secondary | ICD-10-CM | POA: Diagnosis present

## 2022-12-14 DIAGNOSIS — M797 Fibromyalgia: Secondary | ICD-10-CM | POA: Diagnosis present

## 2022-12-14 DIAGNOSIS — E861 Hypovolemia: Secondary | ICD-10-CM | POA: Diagnosis present

## 2022-12-14 DIAGNOSIS — K7581 Nonalcoholic steatohepatitis (NASH): Secondary | ICD-10-CM | POA: Diagnosis present

## 2022-12-14 DIAGNOSIS — G2401 Drug induced subacute dyskinesia: Secondary | ICD-10-CM | POA: Diagnosis present

## 2022-12-14 DIAGNOSIS — G928 Other toxic encephalopathy: Secondary | ICD-10-CM | POA: Diagnosis present

## 2022-12-14 DIAGNOSIS — I129 Hypertensive chronic kidney disease with stage 1 through stage 4 chronic kidney disease, or unspecified chronic kidney disease: Secondary | ICD-10-CM | POA: Diagnosis not present

## 2022-12-14 DIAGNOSIS — I5032 Chronic diastolic (congestive) heart failure: Secondary | ICD-10-CM | POA: Diagnosis present

## 2022-12-14 DIAGNOSIS — D649 Anemia, unspecified: Secondary | ICD-10-CM | POA: Diagnosis not present

## 2022-12-14 DIAGNOSIS — E8722 Chronic metabolic acidosis: Secondary | ICD-10-CM | POA: Diagnosis present

## 2022-12-14 DIAGNOSIS — M86171 Other acute osteomyelitis, right ankle and foot: Secondary | ICD-10-CM | POA: Diagnosis present

## 2022-12-14 DIAGNOSIS — Z886 Allergy status to analgesic agent status: Secondary | ICD-10-CM

## 2022-12-14 DIAGNOSIS — D631 Anemia in chronic kidney disease: Secondary | ICD-10-CM | POA: Diagnosis present

## 2022-12-14 DIAGNOSIS — Z880 Allergy status to penicillin: Secondary | ICD-10-CM

## 2022-12-14 DIAGNOSIS — Z993 Dependence on wheelchair: Secondary | ICD-10-CM

## 2022-12-14 DIAGNOSIS — I5022 Chronic systolic (congestive) heart failure: Secondary | ICD-10-CM | POA: Diagnosis not present

## 2022-12-14 DIAGNOSIS — N189 Chronic kidney disease, unspecified: Secondary | ICD-10-CM | POA: Diagnosis not present

## 2022-12-14 DIAGNOSIS — E119 Type 2 diabetes mellitus without complications: Secondary | ICD-10-CM

## 2022-12-14 DIAGNOSIS — R197 Diarrhea, unspecified: Secondary | ICD-10-CM | POA: Diagnosis not present

## 2022-12-14 DIAGNOSIS — Z833 Family history of diabetes mellitus: Secondary | ICD-10-CM

## 2022-12-14 DIAGNOSIS — I480 Paroxysmal atrial fibrillation: Secondary | ICD-10-CM | POA: Diagnosis present

## 2022-12-14 DIAGNOSIS — E11621 Type 2 diabetes mellitus with foot ulcer: Secondary | ICD-10-CM | POA: Diagnosis present

## 2022-12-14 DIAGNOSIS — I132 Hypertensive heart and chronic kidney disease with heart failure and with stage 5 chronic kidney disease, or end stage renal disease: Secondary | ICD-10-CM | POA: Diagnosis present

## 2022-12-14 DIAGNOSIS — K921 Melena: Secondary | ICD-10-CM

## 2022-12-14 DIAGNOSIS — Z8249 Family history of ischemic heart disease and other diseases of the circulatory system: Secondary | ICD-10-CM

## 2022-12-14 DIAGNOSIS — E1122 Type 2 diabetes mellitus with diabetic chronic kidney disease: Secondary | ICD-10-CM | POA: Diagnosis present

## 2022-12-14 DIAGNOSIS — M48062 Spinal stenosis, lumbar region with neurogenic claudication: Secondary | ICD-10-CM | POA: Diagnosis present

## 2022-12-14 DIAGNOSIS — L97319 Non-pressure chronic ulcer of right ankle with unspecified severity: Secondary | ICD-10-CM | POA: Diagnosis present

## 2022-12-14 DIAGNOSIS — N185 Chronic kidney disease, stage 5: Secondary | ICD-10-CM | POA: Diagnosis not present

## 2022-12-14 DIAGNOSIS — M109 Gout, unspecified: Secondary | ICD-10-CM | POA: Diagnosis present

## 2022-12-14 DIAGNOSIS — Z79891 Long term (current) use of opiate analgesic: Secondary | ICD-10-CM

## 2022-12-14 DIAGNOSIS — F39 Unspecified mood [affective] disorder: Secondary | ICD-10-CM | POA: Diagnosis present

## 2022-12-14 DIAGNOSIS — L89322 Pressure ulcer of left buttock, stage 2: Secondary | ICD-10-CM | POA: Diagnosis present

## 2022-12-14 DIAGNOSIS — K31819 Angiodysplasia of stomach and duodenum without bleeding: Secondary | ICD-10-CM | POA: Diagnosis not present

## 2022-12-14 DIAGNOSIS — E785 Hyperlipidemia, unspecified: Secondary | ICD-10-CM | POA: Diagnosis present

## 2022-12-14 DIAGNOSIS — D62 Acute posthemorrhagic anemia: Secondary | ICD-10-CM | POA: Diagnosis present

## 2022-12-14 DIAGNOSIS — T82898A Other specified complication of vascular prosthetic devices, implants and grafts, initial encounter: Secondary | ICD-10-CM | POA: Diagnosis not present

## 2022-12-14 DIAGNOSIS — E1165 Type 2 diabetes mellitus with hyperglycemia: Secondary | ICD-10-CM | POA: Diagnosis present

## 2022-12-14 DIAGNOSIS — M47816 Spondylosis without myelopathy or radiculopathy, lumbar region: Secondary | ICD-10-CM | POA: Diagnosis present

## 2022-12-14 DIAGNOSIS — E1143 Type 2 diabetes mellitus with diabetic autonomic (poly)neuropathy: Secondary | ICD-10-CM | POA: Diagnosis not present

## 2022-12-14 DIAGNOSIS — E1169 Type 2 diabetes mellitus with other specified complication: Principal | ICD-10-CM | POA: Diagnosis present

## 2022-12-14 DIAGNOSIS — G894 Chronic pain syndrome: Secondary | ICD-10-CM | POA: Diagnosis present

## 2022-12-14 DIAGNOSIS — R1084 Generalized abdominal pain: Secondary | ICD-10-CM | POA: Diagnosis present

## 2022-12-14 DIAGNOSIS — M549 Dorsalgia, unspecified: Secondary | ICD-10-CM | POA: Diagnosis present

## 2022-12-14 DIAGNOSIS — Z6841 Body Mass Index (BMI) 40.0 and over, adult: Secondary | ICD-10-CM

## 2022-12-14 DIAGNOSIS — Z794 Long term (current) use of insulin: Secondary | ICD-10-CM

## 2022-12-14 DIAGNOSIS — F410 Panic disorder [episodic paroxysmal anxiety] without agoraphobia: Secondary | ICD-10-CM | POA: Diagnosis present

## 2022-12-14 DIAGNOSIS — E66813 Obesity, class 3: Secondary | ICD-10-CM | POA: Diagnosis present

## 2022-12-14 DIAGNOSIS — E1151 Type 2 diabetes mellitus with diabetic peripheral angiopathy without gangrene: Secondary | ICD-10-CM | POA: Diagnosis present

## 2022-12-14 DIAGNOSIS — I482 Chronic atrial fibrillation, unspecified: Secondary | ICD-10-CM | POA: Diagnosis present

## 2022-12-14 DIAGNOSIS — L02611 Cutaneous abscess of right foot: Secondary | ICD-10-CM | POA: Diagnosis present

## 2022-12-14 DIAGNOSIS — Z765 Malingerer [conscious simulation]: Secondary | ICD-10-CM

## 2022-12-14 DIAGNOSIS — K209 Esophagitis, unspecified without bleeding: Secondary | ICD-10-CM | POA: Diagnosis not present

## 2022-12-14 DIAGNOSIS — Z888 Allergy status to other drugs, medicaments and biological substances status: Secondary | ICD-10-CM

## 2022-12-14 DIAGNOSIS — I10 Essential (primary) hypertension: Secondary | ICD-10-CM | POA: Diagnosis present

## 2022-12-14 DIAGNOSIS — Z7901 Long term (current) use of anticoagulants: Secondary | ICD-10-CM

## 2022-12-14 DIAGNOSIS — C170 Malignant neoplasm of duodenum: Secondary | ICD-10-CM | POA: Diagnosis present

## 2022-12-14 DIAGNOSIS — E876 Hypokalemia: Secondary | ICD-10-CM | POA: Diagnosis present

## 2022-12-14 DIAGNOSIS — Z860100 Personal history of colon polyps, unspecified: Secondary | ICD-10-CM

## 2022-12-14 DIAGNOSIS — D3A8 Other benign neuroendocrine tumors: Secondary | ICD-10-CM | POA: Diagnosis present

## 2022-12-14 DIAGNOSIS — L89313 Pressure ulcer of right buttock, stage 3: Secondary | ICD-10-CM | POA: Diagnosis present

## 2022-12-14 DIAGNOSIS — I959 Hypotension, unspecified: Secondary | ICD-10-CM | POA: Diagnosis present

## 2022-12-14 DIAGNOSIS — K317 Polyp of stomach and duodenum: Secondary | ICD-10-CM | POA: Diagnosis not present

## 2022-12-14 HISTORY — DX: Anemia, unspecified: D64.9

## 2022-12-14 LAB — CBC WITH DIFFERENTIAL/PLATELET
Abs Immature Granulocytes: 0.14 10*3/uL — ABNORMAL HIGH (ref 0.00–0.07)
Basophils Absolute: 0.1 10*3/uL (ref 0.0–0.1)
Basophils Relative: 0 %
Eosinophils Absolute: 0.1 10*3/uL (ref 0.0–0.5)
Eosinophils Relative: 0 %
HCT: 19.5 % — ABNORMAL LOW (ref 36.0–46.0)
Hemoglobin: 5.8 g/dL — CL (ref 12.0–15.0)
Immature Granulocytes: 1 %
Lymphocytes Relative: 5 %
Lymphs Abs: 1 10*3/uL (ref 0.7–4.0)
MCH: 27.6 pg (ref 26.0–34.0)
MCHC: 29.7 g/dL — ABNORMAL LOW (ref 30.0–36.0)
MCV: 92.9 fL (ref 80.0–100.0)
Monocytes Absolute: 0.6 10*3/uL (ref 0.1–1.0)
Monocytes Relative: 3 %
Neutro Abs: 19.1 10*3/uL — ABNORMAL HIGH (ref 1.7–7.7)
Neutrophils Relative %: 91 %
Platelets: 285 10*3/uL (ref 150–400)
RBC: 2.1 MIL/uL — ABNORMAL LOW (ref 3.87–5.11)
RDW: 18.3 % — ABNORMAL HIGH (ref 11.5–15.5)
WBC: 21 10*3/uL — ABNORMAL HIGH (ref 4.0–10.5)
nRBC: 0 % (ref 0.0–0.2)

## 2022-12-14 LAB — COMPREHENSIVE METABOLIC PANEL
ALT: 14 U/L (ref 0–44)
AST: 14 U/L — ABNORMAL LOW (ref 15–41)
Albumin: 1.5 g/dL — ABNORMAL LOW (ref 3.5–5.0)
Alkaline Phosphatase: 81 U/L (ref 38–126)
Anion gap: 12 (ref 5–15)
BUN: 72 mg/dL — ABNORMAL HIGH (ref 6–20)
CO2: 15 mmol/L — ABNORMAL LOW (ref 22–32)
Calcium: 7 mg/dL — ABNORMAL LOW (ref 8.9–10.3)
Chloride: 105 mmol/L (ref 98–111)
Creatinine, Ser: 6.87 mg/dL — ABNORMAL HIGH (ref 0.44–1.00)
GFR, Estimated: 6 mL/min — ABNORMAL LOW (ref >60)
Glucose, Bld: 284 mg/dL — ABNORMAL HIGH (ref 70–99)
Potassium: 4 mmol/L (ref 3.5–5.1)
Sodium: 132 mmol/L — ABNORMAL LOW (ref 135–145)
Total Bilirubin: 0.3 mg/dL (ref 0.3–1.2)
Total Protein: 5 g/dL — ABNORMAL LOW (ref 6.5–8.1)

## 2022-12-14 LAB — LIPASE, BLOOD: Lipase: 61 U/L — ABNORMAL HIGH (ref 11–51)

## 2022-12-14 LAB — PREPARE RBC (CROSSMATCH)

## 2022-12-14 LAB — BRAIN NATRIURETIC PEPTIDE: B Natriuretic Peptide: 368.9 pg/mL — ABNORMAL HIGH (ref 0.0–100.0)

## 2022-12-14 MED ORDER — ONDANSETRON HCL 4 MG/2ML IJ SOLN
4.0000 mg | Freq: Four times a day (QID) | INTRAMUSCULAR | Status: DC | PRN
  Administered 2022-12-16 – 2022-12-31 (×6): 4 mg via INTRAVENOUS
  Filled 2022-12-14 (×6): qty 2

## 2022-12-14 MED ORDER — ACETAMINOPHEN 500 MG PO TABS
1000.0000 mg | ORAL_TABLET | Freq: Four times a day (QID) | ORAL | Status: DC | PRN
  Filled 2022-12-14 (×2): qty 2

## 2022-12-14 MED ORDER — SODIUM CHLORIDE 0.9 % IV BOLUS
1000.0000 mL | Freq: Once | INTRAVENOUS | Status: AC
  Administered 2022-12-14: 1000 mL via INTRAVENOUS

## 2022-12-14 MED ORDER — SODIUM CHLORIDE 0.9% IV SOLUTION: Freq: Once | INTRAVENOUS | Status: AC

## 2022-12-14 MED ORDER — HYDROMORPHONE HCL 1 MG/ML IJ SOLN
1.0000 mg | Freq: Once | INTRAMUSCULAR | Status: AC
  Administered 2022-12-14: 1 mg via INTRAVENOUS
  Filled 2022-12-14: qty 1

## 2022-12-14 MED ORDER — ONDANSETRON HCL 4 MG/2ML IJ SOLN
4.0000 mg | Freq: Once | INTRAMUSCULAR | Status: AC
  Administered 2022-12-14: 4 mg via INTRAVENOUS
  Filled 2022-12-14: qty 2

## 2022-12-14 MED ORDER — BARIUM SULFATE 2 % PO SUSP
450.0000 mL | Freq: Once | ORAL | Status: AC
  Administered 2022-12-14: 450 mL via ORAL

## 2022-12-14 NOTE — ED Notes (Signed)
Gave PT barium drink at 1720. PT dropped a cup so I went to CT to get another bottle. PT is currently having a hard time getting drink finished

## 2022-12-14 NOTE — Consult Note (Signed)
ESRD Consult Note  Requesting provider: Gerhard Munch Service requesting consult: ER Reason for consult: ESRD, provision of dialysis Indication for acute dialysis?: End Stage Renal Disease  Outpatient dialysis unit: [facility] prescription:  MWF, F1 80, BFR 400, DFR 800, 3K, 2.5 calcium, EDW 126.9 (last weight 130.6), TDC, Mircera 225 last given 10/5, Hectorol 7 mcg  Assessment/Recommendations: Deborah [name] is a/an 57 y.o. female with a past medical history notable for ESRD on HD admitted with abdominal pain and found to have worsening anemia.   # ESRD: under dialyzed as of late given missed and shorten treatments.  Will plan on dialysis in the morning if she remains inpatient which is expected  # Volume/ hypertension: EDW 126.9 kg. Attempt to achieve EDW as tolerated  # Anemia of Chronic Kidney Disease: Hemoglobin 5.8.  Not due for ESA at this time.  Transfusions per primary team.  Consider workup for GI bleeding.  # Secondary Hyperparathyroidism/Hyperphosphatemia: Calcium 7 but corrects to normal when accounting for albumin.  Will give Hectorol with dialysis tomorrow.  Obtain phosphorus  # Vascular access: TDC in place  # Abdominal pain: CT scan without acute concern.  Consider constipation given opioid use.  Management per primary team  # Additional recommendations: - Dose all meds for creatinine clearance < 10 ml/min  - Unless absolutely necessary, no MRIs with gadolinium.  - Implement save arm precautions.  Prefer needle sticks in the dorsum of the hands or wrists.  No blood pressure measurements in arm. - If blood transfusion is requested during hemodialysis sessions, please alert [place] prior to the session.  - Use synthetic opioids (Fentanyl/Dilaudid) if needed  Recommendations were discussed with the primary team.   History of Present Illness: Deborah [name] is a/an 57 y.o. female with a past medical history of ESRD who presents with  weakness  Patient has a hard time relating history.  History was largely obtained from the chart.  Patient had worsening weakness and was brought by her facility to the hospital for further evaluation.  She states that she has had some ongoing pain.  She also has had some abdominal pain that has been getting worse.  Dilaudid seems to help.  She has also had some nausea and diarrhea.  Denies any fevers, chest pain.  She has some chronic shortness of breath.  CT abdomen pelvis in the emergency department without an acute concern.  Labs demonstrate mostly signs consistent with chronic kidney disease.  Albumin is 1.5.  Hemoglobin was 5.8.  Patient missed treatment on 10/2 and 10/7.  1 short treatment on 10/4.  Hemoglobin was 7.4 on 9/30.   Medications:  Current Facility-Administered Medications  Medication Dose Route Frequency Provider Last Rate Last Admin   0.9 %  sodium chloride infusion (Manually program via Guardrails IV Fluids)   Intravenous Once Gerhard Munch, MD       Current Outpatient Medications  Medication Sig Dispense Refill   albuterol (PROVENTIL) (2.5 MG/3ML) 0.083% nebulizer solution Take 3 mLs (2.5 mg total) by nebulization every 6 (six) hours as needed for wheezing or shortness of breath. 150 mL 1   allopurinol (ZYLOPRIM) 100 MG tablet Take 1 tablet (100 mg total) by mouth 2 (two) times daily. 60 tablet 0   apixaban (ELIQUIS) 5 MG TABS tablet Take 5 mg by mouth 2 (two) times daily.     atorvastatin (LIPITOR) 10 MG tablet Take 10 mg by mouth at bedtime.     busPIRone (BUSPAR) 5 MG tablet Take  5 mg by mouth 2 (two) times daily.     cetirizine (ZYRTEC) 10 MG tablet Take by mouth.     colchicine 0.6 MG tablet Take 0.5 tablets (0.3 mg total) by mouth 2 (two) times a week. 4 tablet 2   doxercalciferol (HECTOROL) 4 MCG/2ML injection Inject 2 mLs (4 mcg total) into the vein every Monday, Wednesday, and Friday with hemodialysis. 2 mL 0   DULoxetine HCl 40 MG CPEP Take by mouth.      EASY COMFORT PEN NEEDLES 31G X 5 MM MISC USE 3 TIMES A DAY FOR INSULIN ADMINISTRATION 100 each 3   ferric citrate (AURYXIA) 1 GM 210 MG(Fe) tablet Take 2 tablets (420 mg total) by mouth 3 (three) times daily with meals. 270 tablet 1   fluticasone (FLONASE) 50 MCG/ACT nasal spray Place 2 sprays into both nostrils daily as needed for allergies or rhinitis. (Patient taking differently: Place 1 spray into both nostrils daily as needed for allergies.) 16 g 2   folic acid (FOLVITE) 1 MG tablet Take 1 mg by mouth daily.     gabapentin (NEURONTIN) 100 MG capsule Take 1 capsule (100 mg total) by mouth 3 (three) times daily. 90 capsule 0   HYDROmorphone (DILAUDID) 4 MG tablet Take 1 tablet (4 mg total) by mouth 3 (three) times daily as needed for moderate pain. Three times a day as needed 20 days out of the month. (Patient taking differently: Take 4 mg by mouth 2 (two) times daily.) 80 tablet 0   insulin aspart (NOVOLOG) 100 UNIT/ML injection Inject 5 Units into the skin 3 (three) times daily with meals. 10 mL 0   insulin glargine-yfgn (SEMGLEE) 100 UNIT/ML injection Inject 0.15 mLs (15 Units total) into the skin 2 (two) times daily. 10 mL 0   insulin lispro (HUMALOG) 100 UNIT/ML KwikPen Before each meal 3 times a day, 140-199 - 2 units, 200-250 - 6 units, 251-299 - 8 units,  300-349 - 12 units,  350 or above 14 units. (Patient taking differently: Inject 2-14 Units into the skin See admin instructions. Before each meal and at bedtime. 140-199 - 2 units, 200-250 - 6 units, 251-299 - 8 units,  300-349 - 12 units,  350 or above 14 units.) 15 mL 0   loperamide (IMODIUM A-D) 2 MG tablet Take 2 mg by mouth every 6 (six) hours as needed for diarrhea or loose stools.     melatonin 3 MG TABS tablet Take by mouth.     midodrine (PROAMATINE) 5 MG tablet Take 1 tablet (5 mg total) by mouth 3 (three) times daily with meals. 90 tablet 2   mirtazapine (REMERON) 15 MG tablet Take 15 mg by mouth at bedtime.     Multiple Vitamin  (MULTIVITAMIN WITH MINERALS) TABS tablet Take 1 tablet by mouth daily.     omeprazole (PRILOSEC) 20 MG capsule Take by mouth.     ondansetron (ZOFRAN ODT) 4 MG disintegrating tablet Take 1 tablet (4 mg total) by mouth every 8 (eight) hours as needed for nausea or vomiting. 20 tablet 0   Vitamin D, Ergocalciferol, (DRISDOL) 1.25 MG (50000 UNIT) CAPS capsule Take 50,000 Units by mouth once a week.       ALLERGIES Diazepam, Gabapentin, Iodinated contrast media, Iopamidol, Lisinopril, Metoclopramide, Nsaids, Penicillins, Chlorhexidine, Tolmetin, Aspartame and phenylalanine, Aspirin, Dicyclomine, Rifamycin, Acetaminophen, Cyclobenzaprine, Oxycodone, Rifamycins, and Tramadol  MEDICAL HISTORY Past Medical History:  Diagnosis Date   Acute back pain with sciatica, left    Acute back pain  with sciatica, right    AKI (acute kidney injury) (HCC)    Anemia, unspecified    Cancer (HCC)    Carcinoid tumor of duodenum    Chest pain with normal coronary angiography 2019   Chronic kidney disease, stage 3b (HCC)    Chronic pain    Chronic systolic CHF (congestive heart failure) (HCC)    Diabetes mellitus    DKA (diabetic ketoacidosis) (HCC)    Drug-seeking behavior    21 hospitalizations and 14 CT a/p in 2 years for N/V and abdominal pain, demanding only IV dilaudid   Elevated troponin    chronic   Esophageal reflux    Fibromyalgia    Gastric ulcer    Gastroparesis    Gout    Hyperlipidemia    Hypertension    Hypokalemia    Hypomagnesemia    Lumbosacral stenosis    LVH (left ventricular hypertrophy)    Morbid obesity (HCC)    NICM (nonischemic cardiomyopathy) (HCC)    PAF (paroxysmal atrial fibrillation) (HCC)    Stroke (HCC) 02/2011   Thrombocytosis    Vitamin B12 deficiency anemia      SOCIAL HISTORY Social History   Socioeconomic History   Marital status: Married    Spouse name: Not on file   Number of children: Not on file   Years of education: Not on file   Highest education  level: Not on file  Occupational History   Not on file  Tobacco Use   Smoking status: Never   Smokeless tobacco: Never  Vaping Use   Vaping status: Never Used  Substance and Sexual Activity   Alcohol use: No   Drug use: No   Sexual activity: Not Currently    Birth control/protection: None  Other Topics Concern   Not on file  Social History Narrative   ** Merged History Encounter **       Social Determinants of Health   Financial Resource Strain: Low Risk (03/23/2021)   Received from [facility])   Financial Resource Strain  Food Insecurity: No Food Insecurity (11/23/2022)   Hunger Vital Sign    Worried About Running Out of Food in the Last Year: Never true    Ran Out of Food in the Last Year: Never true  Transportation Needs: No Transportation Needs (11/23/2022)   PRAPARE - Administrator, Civil Service (Medical): No    Lack of Transportation (Non-Medical): No  Physical Activity: Not on File (10/31/2017)   Received from [place], [place]   Physical Activity    Physical Activity: 0  Stress: Low Risk (03/23/2021)   Received [facility])   Stress    Over the last 2 weeks, how often have you been bothered by the following problems: feeling nervous, anxious, on edge?: Not at all    Over the last 2 weeks, how often have you been bothered by the following problems: Not being able to stop or control worrying?: Not at all  Social Connections: Unknown (07/19/2021)   Received from [name]   Social Network    Social Network: Not on file  Intimate Partner Violence: Not At Risk (11/23/2022)   Humiliation, Afraid, Rape, and Kick questionnaire    Fear of Current or Ex-Partner: No    Emotionally Abused: No    Physically Abused: No    Sexually Abused: No     FAMILY HISTORY Family History  Problem Relation Age of Onset  Diabetes Mother    Diabetes Father    Heart disease Father     Diabetes Sister    Congestive Heart Failure Sister 89   Diabetes Brother      Review of Systems: 12 systems were reviewed and negative except per HPI  Physical Exam: Vitals:   12/14/22 1900 12/14/22 1901  BP: 104/63   Pulse: 85   Resp: 16   Temp:  97.6 F (36.4 C)  SpO2: 94%    No intake/output data recorded. No intake or output data in the 24 hours ending 12/14/22 2025 General: chronically ill appearing, no acute distress HEENT: anicteric sclera, MMM CV: normal rate, no murmurs, no edema Lungs: bilateral chest rise, normal wob Abd: soft, ttp, non-distended Skin: no visible lesions or rashes Psych: tired but will wake up and be alert, engaged, appropriate mood and affect Neuro: normal speech, no gross focal deficits   Test Results Reviewed Lab Results  Component Value Date   NA 132 (L) 12/14/2022   K 4.0 12/14/2022   CL 105 12/14/2022   CO2 15 (L) 12/14/2022   BUN 72 (H) 12/14/2022   CREATININE 6.87 (H) 12/14/2022   GFR 60.69 09/27/2013   CALCIUM 7.0 (L) 12/14/2022   ALBUMIN 1.5 (L) 12/14/2022   PHOS 3.8 11/29/2022    I have reviewed relevant outside healthcare records

## 2022-12-14 NOTE — H&P (Incomplete)
History and Physical    MERIL BROCKWAY JXB:[id number] DOB: Aug 30, 1965 DOA: 12/14/2022  PCP: Pcp, No   Patient coming from: Home   Chief Complaint:  Chief Complaint  Patient presents with  . Weakness    HPI: History limited due to patients AMS, somnolence. Obtained from patient, ED record, RN report.  Deborah [name] is a 57 y.o. female with hx of ESRD, PAF on Eliquis, HFpEF, HTN, DM-2, malignant carcinoid, NAFLD, morbid obesity, chronic ulceration R ankle, recent admission 9/16 -24 initially suspected sepsis from SSTI but ultimately sepsis ruled out, and antibiotics were stopped. Treated for gout involving R ankle.    Review of Systems:  ROS complete and negative except as marked above   Allergies  Allergen Reactions  . Diazepam [name]  . Gabapentin [name] and Swelling    Other reaction(s): Unknown  . Iodinated Contrast Media Anaphylaxis and [name]    11/29/17 Cardiac arrest 1 min after IV contrast, possible allergy vs vasovagal episode  Iopamidol   Anaphylaxis   High 11/28/2017   Patient had seizure like activity and then code post 100 cc of isovue 300  . Iopamidol Anaphylaxis    11/28/17 Patient had seizure like activity and then 1 min code after 100 cc of isovue 300. Possible contrast allergy vs vasovagal episode  Other Reaction(s): Cardiac Arrest  . Lisinopril Anaphylaxis    Tongue and mouth swelling  Other Reaction(s): Laryngeal Edema  . Metoclopramide Other (See Comments)    Tardive dyskinesia  Also known as Reglan  Other Reaction(s): Unknown  . Nsaids Anaphylaxis and Other (See Comments)    ULCER  Other Reaction(s): Unknown  . Penicillins Palpitations and Itching    Has patient had a PCN reaction causing immediate rash, facial/tongue/throat swelling, SOB or lightheadedness with hypotension: Yes, heart races  Has patient had a PCN reaction causing severe rash involving mucus membranes or skin necrosis:  No  Has patient had a PCN reaction that required hospitalization: Yes   Has patient had a PCN reaction occurring within the last 10 years: No  Other Reaction(s): Flushing (Red Skin), Laryngeal Edema  . Chlorhexidine Dermatitis  . Tolmetin Nausea Only, Other (See Comments) and Nausea And Vomiting    ULCER  Other Reaction(s): Unknown  . Aspartame And Phenylalanine Hives  . Aspirin Other (See Comments)    Irritates stomach ulcer   . Dicyclomine Other (See Comments)    Chest pain  Other Reaction(s): Unknown  . Rifamycin     Other Reaction(s): palpitations  . Acetaminophen Nausea Only, Other (See Comments) and Nausea And Vomiting    Irritates stomach ulcer; Abdominal pain  Other Reaction(s): Abdominal Pain, ulcer  . Cyclobenzaprine Palpitations    Other Reaction(s): palpitations  . Oxycodone Palpitations    Other Reaction(s): palpitations  . Rifamycins Palpitations  . Tramadol Nausea And Vomiting    Other Reaction(s): palpitations    Prior to Admission medications   Medication Sig Start Date End Date Taking? Authorizing Provider  albuterol (PROVENTIL) (2.5 MG/3ML) 0.083% nebulizer solution Take 3 mLs (2.5 mg total) by nebulization every 6 (six) hours as needed for wheezing or shortness of breath. 04/06/19   Bing Neighbors, NP  allopurinol (ZYLOPRIM) 100 MG tablet Take 1 tablet (100 mg total) by mouth 2 (two) times daily. 11/30/22   Azucena Fallen, MD  apixaban (ELIQUIS) 5 MG TABS tablet Take 5 mg by mouth 2 (two) times daily. 12/03/21   [provider]  atorvastatin (LIPITOR)  10 MG tablet Take 10 mg by mouth at bedtime. 12/03/21   [provider]  busPIRone (BUSPAR) 5 MG tablet Take 5 mg by mouth 2 (two) times daily.    [provider]  cetirizine (ZYRTEC) 10 MG tablet Take by mouth. 08/05/22   [provider]  colchicine 0.6 MG tablet Take 0.5 tablets (0.3 mg total) by mouth 2 (two) times a week. 12/02/22   Azucena Fallen, MD   doxercalciferol (HECTOROL) 4 MCG/2ML injection Inject 2 mLs (4 mcg total) into the vein every Monday, Wednesday, and Friday with hemodialysis. 11/30/22   Azucena Fallen, MD  DULoxetine HCl 40 MG CPEP Take by mouth. 08/05/22   [provider]  EASY COMFORT PEN NEEDLES 31G X 5 MM MISC USE 3 TIMES A DAY FOR INSULIN ADMINISTRATION 11/14/19   Meccariello, Solmon Ice, MD  ferric citrate (AURYXIA) 1 GM 210 MG(Fe) tablet Take 2 tablets (420 mg total) by mouth 3 (three) times daily with meals. 11/30/22   Azucena Fallen, MD  fluticasone [facility]) 50 MCG/ACT nasal spray Place 2 sprays into both nostrils daily as needed for allergies or rhinitis. Patient taking differently: Place 1 spray into both nostrils daily as needed for allergies. 12/19/18   Rai, Delene Ruffini, MD  folic acid (FOLVITE) 1 MG tablet Take 1 mg by mouth daily. 04/07/21   [provider]  gabapentin (NEURONTIN) 100 MG capsule Take 1 capsule (100 mg total) by mouth 3 (three) times daily. 11/30/22   Azucena Fallen, MD  HYDROmorphone (DILAUDID) 4 MG tablet Take 1 tablet (4 mg total) by mouth 3 (three) times daily as needed for moderate pain. Three times a day as needed 20 days out of the month. Patient taking differently: Take 4 mg by mouth 2 (two) times daily. 11/18/22   Jones Bales, NP  insulin aspart (NOVOLOG) 100 UNIT/ML injection Inject 5 Units into the skin 3 (three) times daily with meals. 06/04/22   Leroy Sea, MD  insulin glargine-yfgn (SEMGLEE) 100 UNIT/ML injection Inject 0.15 mLs (15 Units total) into the skin 2 (two) times daily. 07/05/22   Rhetta Mura, MD  insulin lispro (HUMALOG) 100 UNIT/ML KwikPen Before each meal 3 times a day, 140-199 - 2 units, 200-250 - 6 units, 251-299 - 8 units,  300-349 - 12 units,  350 or above 14 units. Patient taking differently: Inject 2-14 Units into the skin See admin instructions. Before each meal and at bedtime. 140-199 - 2 units, 200-250 - 6 units, 251-299 -  8 units,  300-349 - 12 units,  350 or above 14 units. 06/04/22   Leroy Sea, MD  loperamide (IMODIUM A-D) 2 MG tablet Take 2 mg by mouth every 6 (six) hours as needed for diarrhea or loose stools.    [provider]  melatonin 3 MG TABS tablet Take by mouth. 10/21/20   [provider]  midodrine (PROAMATINE) 5 MG tablet Take 1 tablet (5 mg total) by mouth 3 (three) times daily with meals. 11/30/22   Azucena Fallen, MD  mirtazapine (REMERON) 15 MG tablet Take 15 mg by mouth at bedtime. 10/18/22   [provider]  Multiple Vitamin (MULTIVITAMIN WITH MINERALS) TABS tablet Take 1 tablet by mouth daily. 05/28/22   Kathlen Mody, MD  omeprazole (PRILOSEC) 20 MG capsule Take by mouth. 08/05/22   [provider]  ondansetron (ZOFRAN ODT) 4 MG disintegrating tablet Take 1 tablet (4 mg total) by mouth every 8 (eight) hours as  needed for nausea or vomiting. 05/30/21   [name], MD  Vitamin D, Ergocalciferol, (DRISDOL) 1.25 MG (50000 UNIT) CAPS capsule Take 50,000 Units by mouth once a week. 04/07/21   [provider]    Past Medical History:  Diagnosis Date  . Acute back pain with sciatica, left   . Acute back pain with sciatica, right   . AKI (acute kidney injury) (HCC)   . Anemia, unspecified   . Cancer (HCC)   . Carcinoid tumor of duodenum   . Chest pain with normal coronary angiography 2019  . Chronic kidney disease, stage 3b (HCC)   . Chronic pain   . Chronic systolic CHF (congestive heart failure) (HCC)   . Diabetes mellitus   . DKA (diabetic ketoacidosis) (HCC)   . Drug-seeking behavior    21 hospitalizations and 14 CT a/p in 2 years for N/V and abdominal pain, demanding only IV dilaudid  . Elevated troponin    chronic  . Esophageal reflux   . Fibromyalgia   . Gastric ulcer   . Gastroparesis   . Gout   . Hyperlipidemia   . Hypertension   . Hypokalemia   . Hypomagnesemia   . Lumbosacral stenosis   . LVH (left ventricular  hypertrophy)   . Morbid obesity (HCC)   . NICM (nonischemic cardiomyopathy) (HCC)   . PAF (paroxysmal atrial fibrillation) (HCC)   . Stroke (HCC) 02/2011  . Thrombocytosis   . Vitamin B12 deficiency anemia     Past Surgical History:  Procedure Laterality Date  . ABDOMINAL AORTOGRAM W/LOWER EXTREMITY N/A 11/29/2022   Procedure: ABDOMINAL AORTOGRAM W/LOWER EXTREMITY;  Surgeon: Daria Pastures, MD;  [facility] INVASIVE CV LAB;  Service: Cardiovascular;  Laterality: N/A;  . AV FISTULA PLACEMENT Left 06/30/2022   Procedure: LEFT BRACHIOCEPHALIC ARTERIOVENOUS (AV) FISTULA CREATION;  Surgeon: Cephus Shelling, MD;  Location: [facility] OR;  Service: Vascular;  Laterality: Left;  . BIOPSY  07/27/2019   Procedure: BIOPSY;  Surgeon: Vida Rigger, MD;  Location: [facility] ENDOSCOPY;  Service: Endoscopy;;  . BIOPSY  07/30/2019   Procedure: BIOPSY;  Surgeon: Kathi Der, MD;  Location: [facility] ENDOSCOPY;  Service: Gastroenterology;;  . CATARACT EXTRACTION  01/2014  . CHOLECYSTECTOMY    . COLONOSCOPY WITH PROPOFOL N/A 07/30/2019   Procedure: COLONOSCOPY WITH PROPOFOL;  Surgeon: Kathi Der, MD;  Location: [facility] ENDOSCOPY;  Service: Gastroenterology;  Laterality: N/A;  . ESOPHAGOGASTRODUODENOSCOPY N/A 07/27/2019   Procedure: ESOPHAGOGASTRODUODENOSCOPY (EGD);  Surgeon: Vida Rigger, MD;  Location: [facility];  Service: Endoscopy;  Laterality: N/A;  . ESOPHAGOGASTRODUODENOSCOPY N/A 07/26/2020   Procedure: ESOPHAGOGASTRODUODENOSCOPY (EGD);  Surgeon: Willis Modena, MD;  Location: [facility]OSCOPY;  Service: Endoscopy;  Laterality: N/A;  . ESOPHAGOGASTRODUODENOSCOPY (EGD) WITH PROPOFOL N/A 08/02/2019   Procedure: ESOPHAGOGASTRODUODENOSCOPY (EGD) WITH PROPOFOL;  Surgeon: Kathi Der, MD;  Location: [facility] ENDOSCOPY;  Service: Gastroenterology;  Laterality: N/A;  . HEMOSTASIS CLIP PLACEMENT  08/02/2019   Procedure: HEMOSTASIS CLIP PLACEMENT;  Surgeon: Kathi Der, MD;  Location: [facility] ENDOSCOPY;  Service:  Gastroenterology;;  . IR FLUORO GUIDE CV LINE RIGHT  06/24/2022  . IR US GUIDE VASC ACCESS RIGHT  06/24/2022  . POLYPECTOMY  07/30/2019   Procedure: POLYPECTOMY;  Surgeon: Kathi Der, MD;  Location: [facility] ENDOSCOPY;  Service: Gastroenterology;;  . POLYPECTOMY  08/02/2019   Procedure: POLYPECTOMY;  Surgeon: Kathi Der, MD;  Location: [facility] ENDOSCOPY;  Service: Gastroenterology;;     reports that she has never smoked. She has never used smokeless tobacco. She reports that she does not  drink alcohol and does not use drugs.  Family History  Problem Relation Age of Onset  . Diabetes Mother   . Diabetes Father   . Heart disease Father   . Diabetes Sister   . Congestive Heart Failure Sister 31  . Diabetes Brother      Physical Exam: Vitals:   12/14/22 2104 12/14/22 2130 12/14/22 2145 12/14/22 2200  BP: 107/64 (!) 112/55 113/61 (!) 106/58  Pulse: 83 87 87 85  Resp: 20     Temp: 97.8 F (36.6 C)     TempSrc: Oral     SpO2: 98% 98% 100% 98%  Weight:      Height:        Gen: Awake, alert, NAD ***  CV: Regular, normal S1, S2, no murmurs  Resp: Normal WOB, CTAB  Abd: Flat, normoactive, nontender MSK: Symmetric, no edema  Skin: No rashes or lesions to exposed skin  Neuro: Alert and interactive  Psych: euthymic, appropriate    Data review:   Labs reviewed, notable for:  ***  Micro:  Results for orders placed or performed during the hospital encounter of 11/22/22  Resp panel by RT-PCR (RSV, Flu A&B, Covid) Anterior Nasal Swab     Status: None   Collection Time: 11/23/22 12:23 AM   Specimen: Anterior Nasal Swab  Result Value Ref Range Status   SARS Coronavirus 2 by RT PCR NEGATIVE NEGATIVE Final   Influenza A by PCR NEGATIVE NEGATIVE Final   Influenza B by PCR NEGATIVE NEGATIVE Final    Comment: (NOTE) The Xpert Xpress SARS-CoV-2/FLU/RSV plus assay is intended as an aid in the diagnosis of influenza from Nasopharyngeal swab specimens and should not be used as a sole  basis for treatment. Nasal washings and aspirates are unacceptable for Xpert Xpress SARS-CoV-2/FLU/RSV testing.  Fact Sheet for Patients: BloggerCourse.com  Fact Sheet for Healthcare Providers: SeriousBroker.it  This test is not yet approved or cleared by the [place] FDA and has been authorized for detection and/or diagnosis of SARS-CoV-2 by FDA under an Emergency Use Authorization (EUA). This EUA will remain in effect (meaning this test can be used) for the duration of the COVID-19 declaration under Section 564(b)(1) of the Act, 21 U.S.C. section 360bbb-3(b)(1), unless the authorization is terminated or revoked.     Resp Syncytial Virus by PCR NEGATIVE NEGATIVE Final    Comment: (NOTE) Fact Sheet for Patients: BloggerCourse.com  Fact Sheet for Healthcare Providers: SeriousBroker.it  This test is not yet approved or cleared by the [place] FDA and has been authorized for detection and/or diagnosis of SARS-CoV-2 by FDA under an Emergency Use Authorization (EUA). This EUA will remain in effect (meaning this test can be used) for the duration of the COVID-19 declaration under Section 564(b)(1) of the Act, 21 U.S.C. section 360bbb-3(b)(1), unless the authorization is terminated or revoked.  Performed at [facility], [address] [place], [place] [address]   Blood Culture (routine x 2)     Status: None   Collection Time: 11/23/22 12:23 AM   Specimen: BLOOD  Result Value Ref Range Status   Specimen Description BLOOD SITE NOT SPECIFIED  Final   Special Requests   Final    BOTTLES DRAWN AEROBIC AND ANAEROBIC Blood Culture adequate volume   Culture   Final    NO GROWTH 5 DAYS Performed at [facility], [address], [place], [place] [address]    Report Status 11/28/2022 FINAL  Final    Imaging reviewed:  CT ABDOMEN PELVIS WO CONTRAST  Result  Date: 12/14/2022 CLINICAL DATA:  Acute, nonlocalized abdominal pain. Weakness with diarrhea for 4 days, nausea, and vomiting. EXAM: CT ABDOMEN AND PELVIS WITHOUT CONTRAST TECHNIQUE: Multidetector CT imaging of the abdomen and pelvis was performed following the standard protocol without IV contrast. RADIATION DOSE REDUCTION: This exam was performed according to the departmental dose-optimization program which includes automated exposure control, adjustment of the mA and/or kV according to patient size and/or use of iterative reconstruction technique. COMPARISON:  11/25/2022 FINDINGS: Lower chest:  No contributory findings. Hepatobiliary: No focal liver abnormality.Cholecystectomy. No biliary dilatation Pancreas: Unremarkable. Spleen: Unremarkable. Adrenals/Urinary Tract: Negative adrenals. No hydronephrosis or stone. Symmetric perinephric stranding that is nonspecific. Unremarkable bladder. Stomach/Bowel:  No obstruction. No appendicitis. Vascular/Lymphatic: No acute vascular abnormality. Atheromatous calcification of the aorta and iliacs. No mass or adenopathy. Reproductive:No pathologic findings. Exophytic uterine fibroid towards the left at the fundus measuring nearly 5 cm Other: No ascites or pneumoperitoneum. Musculoskeletal: No acute abnormalities. Lumbar spine degeneration with mild L4-5 anterolisthesis. IMPRESSION: No acute finding or change since prior. No bowel obstruction or visible inflammation. Electronically Signed   By: Tiburcio Pea M.D.   On: 12/14/2022 19:18    EKG: ***   ED Course: ***    Assessment/Plan:  57 y.o. female with hx ***   Body mass index is 45.37 kg/m. ***   DVT prophylaxis:  {Blank single:19197::"Lovenox","SQ Heparin","IV heparin gtts","Xarelto","Eliquis","Coumadin","SCDs","***"} Code Status:  {Blank single:19197::"Full Code","DNR with Intubation","DNR/DNI(Do NOT Intubate)","Comfort Care","***"} Diet:  Diet Orders (From admission, onward)     Start     Ordered    12/14/22 2037  Diet renal with fluid restriction Fluid restriction: 1200 mL Fluid; Room service appropriate? Yes; Fluid consistency: Thin  Diet effective now       Question Answer Comment  Fluid restriction: 1200 mL Fluid   Room service appropriate? Yes   Fluid consistency: Thin      12/14/22 2039           Family Communication:  ***  Consults:  ***  Admission status:   {Blank single:19197::"Observation","Inpatient"}, {Blank single:19197::"Med-Surg","Telemetry bed","Step Down Unit"}  Severity of Illness: {Observation/Inpatient:21159}   Dolly Rias, MD [facility]  How to contact the [facility] Attending or Consulting provider 7A - 7P or covering provider during after hours 7P -7A, for this patient.  Check the care team in [facility] and look for a) attending/consulting [facility] provider listed and b) the [facility] team listed Log into [email or web address] and use 's universal password to access. If you do not have the password, please contact the hospital operator. Locate the [facility] provider you are looking for under [facility] and page to a number that you can be directly reached. If you still have difficulty reaching the provider, please page the [facility] (Director on Call) for the Hospitalists listed on amion for assistance.  12/14/2022, 11:58 PM

## 2022-12-14 NOTE — ED Notes (Signed)
Patient transported to X-ray 

## 2022-12-14 NOTE — ED Triage Notes (Signed)
  Patient brought in by [facility] for weakness. Patient is dialysis patient MWF, last treatment Friday. Patient endorses weakness, diarrhea x 4 days, nausea, vomiting. Patient noted to have increased confusion over last month per caregiver.   EMS 120/65 100 hr 16 rr Spo2 99 on room air

## 2022-12-14 NOTE — ED Notes (Signed)
Patient noted to have wound on left buttock, skin breakdown under abdominal fold on left side. Patient complains of extreme pain upon touch.

## 2022-12-14 NOTE — ED Notes (Signed)
Patient transported to CT 

## 2022-12-14 NOTE — ED Provider Notes (Signed)
[place] EMERGENCY DEPARTMENT AT [facility] Provider Note   [name]: [id number] Arrival date & time: 12/14/22  1445     History  Chief Complaint  Patient presents with   Weakness    Deborah [name] is a 57 y.o. female.  HPI Adult female with multiple medical issues including prior cancer of duodenum, ESRD, GERD, obesity, diabetes now presents with abdominal pain.  Onset was yesterday, since that time pain has been persistent.  She does note a history of chronic pain for which she takes Dilaudid, notes that that has not substantially improved her pain.  There is associated nausea, anorexia, diarrhea.  No fever, no other chest pain, dyspnea.  She missed 1 dialysis session, and last session was 5 days ago.    Home Medications Prior to Admission medications   Medication Sig Start Date End Date Taking? Authorizing Provider  albuterol (PROVENTIL) (2.5 MG/3ML) 0.083% nebulizer solution Take 3 mLs (2.5 mg total) by nebulization every 6 (six) hours as needed for wheezing or shortness of breath. 04/06/19   Bing Neighbors, NP  allopurinol (ZYLOPRIM) 100 MG tablet Take 1 tablet (100 mg total) by mouth 2 (two) times daily. 11/30/22   Azucena Fallen, MD  apixaban (ELIQUIS) 5 MG TABS tablet Take 5 mg by mouth 2 (two) times daily. 12/03/21   [provider]  atorvastatin (LIPITOR) 10 MG tablet Take 10 mg by mouth at bedtime. 12/03/21   [provider]  busPIRone (BUSPAR) 5 MG tablet Take 5 mg by mouth 2 (two) times daily.    [provider]  cetirizine (ZYRTEC) 10 MG tablet Take by mouth. 08/05/22   [provider]  colchicine 0.6 MG tablet Take 0.5 tablets (0.3 mg total) by mouth 2 (two) times a week. 12/02/22   Azucena Fallen, MD  doxercalciferol (HECTOROL) 4 MCG/2ML injection Inject 2 mLs (4 mcg total) into the vein every Monday, Wednesday, and Friday with hemodialysis. 11/30/22   Azucena Fallen, MD  DULoxetine HCl 40 MG CPEP Take  by mouth. 08/05/22   [provider]  EASY COMFORT PEN NEEDLES 31G X 5 MM MISC USE 3 TIMES A DAY FOR INSULIN ADMINISTRATION 11/14/19   Meccariello, Solmon Ice, MD  ferric citrate (AURYXIA) 1 GM 210 MG(Fe) tablet Take 2 tablets (420 mg total) by mouth 3 (three) times daily with meals. 11/30/22   Azucena Fallen, MD  fluticasone [facility]) 50 MCG/ACT nasal spray Place 2 sprays into both nostrils daily as needed for allergies or rhinitis. Patient taking differently: Place 1 spray into both nostrils daily as needed for allergies. 12/19/18   Rai, Delene Ruffini, MD  folic acid (FOLVITE) 1 MG tablet Take 1 mg by mouth daily. 04/07/21   [provider]  gabapentin (NEURONTIN) 100 MG capsule Take 1 capsule (100 mg total) by mouth 3 (three) times daily. 11/30/22   Azucena Fallen, MD  HYDROmorphone (DILAUDID) 4 MG tablet Take 1 tablet (4 mg total) by mouth 3 (three) times daily as needed for moderate pain. Three times a day as needed 20 days out of the month. Patient taking differently: Take 4 mg by mouth 2 (two) times daily. 11/18/22   Jones Bales, NP  insulin aspart (NOVOLOG) 100 UNIT/ML injection Inject 5 Units into the skin 3 (three) times daily with meals. 06/04/22   Leroy Sea, MD  insulin glargine-yfgn (SEMGLEE) 100 UNIT/ML injection Inject 0.15 mLs (15 Units total) into the skin 2 (two) times daily. 07/05/22  Rhetta Mura, MD  insulin lispro (HUMALOG) 100 UNIT/ML KwikPen Before each meal 3 times a day, 140-199 - 2 units, 200-250 - 6 units, 251-299 - 8 units,  300-349 - 12 units,  350 or above 14 units. Patient taking differently: Inject 2-14 Units into the skin See admin instructions. Before each meal and at bedtime. 140-199 - 2 units, 200-250 - 6 units, 251-299 - 8 units,  300-349 - 12 units,  350 or above 14 units. 06/04/22   Leroy Sea, MD  loperamide (IMODIUM A-D) 2 MG tablet Take 2 mg by mouth every 6 (six) hours as needed for diarrhea or loose stools.     [provider]  melatonin 3 MG TABS tablet Take by mouth. 10/21/20   [provider]  midodrine (PROAMATINE) 5 MG tablet Take 1 tablet (5 mg total) by mouth 3 (three) times daily with meals. 11/30/22   Azucena Fallen, MD  mirtazapine (REMERON) 15 MG tablet Take 15 mg by mouth at bedtime. 10/18/22   [provider]  Multiple Vitamin (MULTIVITAMIN WITH MINERALS) TABS tablet Take 1 tablet by mouth daily. 05/28/22   Kathlen Mody, MD  omeprazole (PRILOSEC) 20 MG capsule Take by mouth. 08/05/22   [provider]  ondansetron (ZOFRAN ODT) 4 MG disintegrating tablet Take 1 tablet (4 mg total) by mouth every 8 (eight) hours as needed for nausea or vomiting. 05/30/21   [name], MD  Vitamin D, Ergocalciferol, (DRISDOL) 1.25 MG (50000 UNIT) CAPS capsule Take 50,000 Units by mouth once a week. 04/07/21   [provider]      Allergies    Diazepam, Gabapentin, Iodinated contrast media, Iopamidol, Lisinopril, Metoclopramide, Nsaids, Penicillins, Chlorhexidine, Tolmetin, Aspartame and phenylalanine, Aspirin, Dicyclomine, Rifamycin, Acetaminophen, Cyclobenzaprine, Oxycodone, Rifamycins, and Tramadol    Review of Systems   Review of Systems  All other systems reviewed and are negative.   Physical Exam Updated Vital Signs BP 104/63   Pulse 85   Temp 97.6 F (36.4 C) (Oral)   Resp 16   Ht 5\' 6"  (1.676 m)   Wt 127.5 kg   LMP 10/10/2012   SpO2 94%   BMI 45.37 kg/m  Physical Exam Vitals and nursing note reviewed.  Constitutional:      General: She is not in acute distress.    Appearance: She is well-developed. She is obese.  HENT:     Head: Normocephalic and atraumatic.  Eyes:     Conjunctiva/sclera: Conjunctivae normal.  Cardiovascular:     Rate and Rhythm: Regular rhythm. Tachycardia present.  Pulmonary:     Effort: Pulmonary effort is normal. No respiratory distress.     Breath sounds: Normal breath sounds. No stridor.  Abdominal:      General: There is no distension.     Tenderness: There is abdominal tenderness. There is guarding.  Skin:    General: Skin is warm and dry.  Neurological:     Mental Status: She is alert and oriented to person, place, and time.     Cranial Nerves: No cranial nerve deficit.  Psychiatric:        Mood and Affect: Mood normal.     ED Results / Procedures / Treatments   Labs (all labs ordered are listed, but only abnormal results are displayed) Labs Reviewed  COMPREHENSIVE METABOLIC PANEL - Abnormal; Notable for the following components:      Result Value   Sodium 132 (*)    CO2 15 (*)    Glucose, Bld 284 (*)  BUN 72 (*)    Creatinine, Ser 6.87 (*)    Calcium 7.0 (*)    Total Protein 5.0 (*)    Albumin 1.5 (*)    AST 14 (*)    GFR, Estimated 6 (*)    All other components within normal limits  LIPASE, BLOOD - Abnormal; Notable for the following components:   Lipase 61 (*)    All other components within normal limits  CBC WITH DIFFERENTIAL/PLATELET - Abnormal; Notable for the following components:   WBC 21.0 (*)    RBC 2.10 (*)    Hemoglobin 5.8 (*)    HCT 19.5 (*)    MCHC 29.7 (*)    RDW 18.3 (*)    Neutro Abs 19.1 (*)    Abs Immature Granulocytes 0.14 (*)    All other components within normal limits  BRAIN NATRIURETIC PEPTIDE - Abnormal; Notable for the following components:   B Natriuretic Peptide 368.9 (*)    All other components within normal limits  URINALYSIS, ROUTINE W REFLEX MICROSCOPIC  TYPE AND SCREEN  PREPARE RBC (CROSSMATCH)    EKG None  Radiology CT ABDOMEN PELVIS WO CONTRAST  Result Date: 12/14/2022 CLINICAL DATA:  Acute, nonlocalized abdominal pain. Weakness with diarrhea for 4 days, nausea, and vomiting. EXAM: CT ABDOMEN AND PELVIS WITHOUT CONTRAST TECHNIQUE: Multidetector CT imaging of the abdomen and pelvis was performed following the standard protocol without IV contrast. RADIATION DOSE REDUCTION: This exam was performed according to the  departmental dose-optimization program which includes automated exposure control, adjustment of the mA and/or kV according to patient size and/or use of iterative reconstruction technique. COMPARISON:  11/25/2022 FINDINGS: Lower chest:  No contributory findings. Hepatobiliary: No focal liver abnormality.Cholecystectomy. No biliary dilatation Pancreas: Unremarkable. Spleen: Unremarkable. Adrenals/Urinary Tract: Negative adrenals. No hydronephrosis or stone. Symmetric perinephric stranding that is nonspecific. Unremarkable bladder. Stomach/Bowel:  No obstruction. No appendicitis. Vascular/Lymphatic: No acute vascular abnormality. Atheromatous calcification of the aorta and iliacs. No mass or adenopathy. Reproductive:No pathologic findings. Exophytic uterine fibroid towards the left at the fundus measuring nearly 5 cm Other: No ascites or pneumoperitoneum. Musculoskeletal: No acute abnormalities. Lumbar spine degeneration with mild L4-5 anterolisthesis. IMPRESSION: No acute finding or change since prior. No bowel obstruction or visible inflammation. Electronically Signed   By: Tiburcio Pea M.D.   On: 12/14/2022 19:18    Procedures Procedures    Medications Ordered in ED Medications  0.9 %  sodium chloride infusion (Manually program via Guardrails IV Fluids) (has no administration in time range)  ondansetron (ZOFRAN) injection 4 mg (4 mg Intravenous Given 12/14/22 1729)  HYDROmorphone (DILAUDID) injection 1 mg (1 mg Intravenous Given 12/14/22 1732)  sodium chloride 0.9 % bolus 1,000 mL (1,000 mLs Intravenous New Bag/Given 12/14/22 1742)  barium (READI-CAT 2) 2 % suspension 450 mL (450 mLs Oral Given 12/14/22 1727)    ED Course/ Medical Decision Making/ A&P                                 Medical Decision Making Obese adult female with multiple medical issues including in stage renal disease, cardiac disease, prior malignancy presents with abdominal pain, diarrhea, weakness.  With substantial  comorbidities for intra-abdominal processes including peritonitis, malignancy, infection, obstruction CT scan ordered, analgesics ordered, monitoring started. Cardiac 105 sinus tach abnormal Pulse ox 95% borderline   Amount and/or Complexity of Data Reviewed External Data Reviewed: notes. Labs: ordered. Decision-making details documented in ED Course. Radiology:  ordered and independent interpretation performed. Decision-making details documented in ED Course.  Risk Prescription drug management. Decision regarding hospitalization.   8:21 PM Patient calm, seemingly no abdominal pain.  CT unremarkable.  However, the patient found of critically abnormal hemoglobin, likely due to the patient's renal disease.  Patient will require transfusion, she is aware of this, agreeable to it. She has a type and screen, transfusion pending crossmatch. She remains hemodynamically notable for mild hypotension, though this may be secondary to her renal dysfunction as well. She is otherwise mentating appropriately.  Patient admitted for transfusion, monitoring, management.   CRITICAL CARE Performed by: Gerhard Munch Total critical care time: 35 minutes Critical care time was exclusive of separately billable procedures and treating other patients. Critical care was necessary to treat or prevent imminent or life-threatening deterioration. Critical care was time spent personally by me on the following activities: development of treatment plan with patient and/or surrogate as well as nursing, discussions with consultants, evaluation of patient's response to treatment, examination of patient, obtaining history from patient or surrogate, ordering and performing treatments and interventions, ordering and review of laboratory studies, ordering and review of radiographic studies, pulse oximetry and re-evaluation of patient's condition.   Final Clinical Impression(s) / ED Diagnoses Final diagnoses:  Symptomatic  anemia  Generalized abdominal pain     Gerhard Munch, MD 12/14/22 2023

## 2022-12-14 NOTE — H&P (Signed)
History and Physical    Deborah [name] NGE:[id number] DOB: [birth year] DOA: 12/14/2022  PCP: Pcp, No   Patient coming from: Home   Chief Complaint:  Chief Complaint  Patient presents with   Weakness    HPI: History limited due to patients AMS, somnolence. Obtained from patient, ED record, RN report.  Deborah [name] is a 57 y.o. female with hx of ESRD on M/W/F HD, PAF on Eliquis, HFpEF, HTN, DM-2, malignant carcinoid, NAFLD, morbid obesity, chronic ulceration R ankle, recent admission 9/16 -24 initially suspected sepsis from SSTI but ultimately sepsis ruled out, and antibiotics were stopped. That admission underwent angiogram which showed adequate perfusion of the R foot. Treated for gout involving R ankle. Returns due to initial complaint of abd pain, per ED note 1 day history persistent abd pain. Had also missed last HD session, last session on 10/4. Found to have anemia, with Hb of 5.8. On further history, per RN report patients mother provided collateral of 1 week of bright red blood per rectum (although some uncertainty if could be coming from sacral wound). At time of interview patient is somnolent, disoriented, unable to provide much history. At one point wakes up and reports only 1 day of bright red rectal bleeding. Has been taking Eliquis for history of Afib.   Attempted to call her mother for collateral but phone was off.   Review of Systems: Limited due to AMS. Per above.    Allergies  Allergen Reactions   Diazepam [name]   Gabapentin [name] and Swelling    Other reaction(s): Unknown   Iodinated Contrast Media Anaphylaxis and [name]    11/29/17 Cardiac arrest 1 min after IV contrast, possible allergy vs vasovagal episode  Iopamidol   Anaphylaxis   High 11/28/2017   Patient had seizure like activity and then code post 100 cc of isovue 300   Iopamidol Anaphylaxis    11/28/17 Patient had seizure like activity and then 1 min  code after 100 cc of isovue 300. Possible contrast allergy vs vasovagal episode  Other Reaction(s): Cardiac Arrest   Lisinopril Anaphylaxis    Tongue and mouth swelling  Other Reaction(s): Laryngeal Edema   Metoclopramide Other (See Comments)    Tardive dyskinesia  Also known as Reglan  Other Reaction(s): Unknown   Nsaids Anaphylaxis and Other (See Comments)    ULCER  Other Reaction(s): Unknown   Penicillins Palpitations and Itching    Has patient had a PCN reaction causing immediate rash, facial/tongue/throat swelling, SOB or lightheadedness with hypotension: Yes, heart races  Has patient had a PCN reaction causing severe rash involving mucus membranes or skin necrosis: No  Has patient had a PCN reaction that required hospitalization: Yes   Has patient had a PCN reaction occurring within the last 10 years: No  Other Reaction(s): Flushing (Red Skin), Laryngeal Edema   Chlorhexidine Dermatitis   Tolmetin Nausea Only, Other (See Comments) and Nausea And Vomiting    ULCER  Other Reaction(s): Unknown   Aspartame And Phenylalanine Hives   Aspirin Other (See Comments)    Irritates stomach ulcer    Dicyclomine Other (See Comments)    Chest pain  Other Reaction(s): Unknown   Rifamycin     Other Reaction(s): palpitations   Acetaminophen Nausea Only, Other (See Comments) and Nausea And Vomiting    Irritates stomach ulcer; Abdominal pain  Other Reaction(s): Abdominal Pain, ulcer   Cyclobenzaprine Palpitations    Other Reaction(s): palpitations   Oxycodone Palpitations  Other Reaction(s): palpitations   Rifamycins Palpitations   Tramadol Nausea And Vomiting    Other Reaction(s): palpitations    Prior to Admission medications   Medication Sig Start Date End Date Taking? Authorizing Provider  albuterol (PROVENTIL) (2.5 MG/3ML) 0.083% nebulizer solution Take 3 mLs (2.5 mg total) by nebulization every 6 (six) hours as needed for wheezing or shortness of breath. 04/06/19    Bing Neighbors, NP  allopurinol (ZYLOPRIM) 100 MG tablet Take 1 tablet (100 mg total) by mouth 2 (two) times daily. 11/30/22   Azucena Fallen, MD  apixaban (ELIQUIS) 5 MG TABS tablet Take 5 mg by mouth 2 (two) times daily. 12/03/21   [provider]  atorvastatin (LIPITOR) 10 MG tablet Take 10 mg by mouth at bedtime. 12/03/21   [provider]  busPIRone (BUSPAR) 5 MG tablet Take 5 mg by mouth 2 (two) times daily.    [provider]  cetirizine (ZYRTEC) 10 MG tablet Take by mouth. 08/05/22   [provider]  colchicine 0.6 MG tablet Take 0.5 tablets (0.3 mg total) by mouth 2 (two) times a week. 12/02/22   Azucena Fallen, MD  doxercalciferol (HECTOROL) 4 MCG/2ML injection Inject 2 mLs (4 mcg total) into the vein every Monday, Wednesday, and Friday with hemodialysis. 11/30/22   Azucena Fallen, MD  DULoxetine HCl 40 MG CPEP Take by mouth. 08/05/22   [provider]  EASY COMFORT PEN NEEDLES 31G X 5 MM MISC USE 3 TIMES A DAY FOR INSULIN ADMINISTRATION 11/14/19   Meccariello, Solmon Ice, MD  ferric citrate (AURYXIA) 1 GM 210 MG(Fe) tablet Take 2 tablets (420 mg total) by mouth 3 (three) times daily with meals. 11/30/22   Azucena Fallen, MD  fluticasone [facility]) 50 MCG/ACT nasal spray Place 2 sprays into both nostrils daily as needed for allergies or rhinitis. Patient taking differently: Place 1 spray into both nostrils daily as needed for allergies. 12/19/18   Rai, Delene Ruffini, MD  folic acid (FOLVITE) 1 MG tablet Take 1 mg by mouth daily. 04/07/21   [provider]  gabapentin (NEURONTIN) 100 MG capsule Take 1 capsule (100 mg total) by mouth 3 (three) times daily. 11/30/22   Azucena Fallen, MD  HYDROmorphone (DILAUDID) 4 MG tablet Take 1 tablet (4 mg total) by mouth 3 (three) times daily as needed for moderate pain. Three times a day as needed 20 days out of the month. Patient taking differently: Take 4 mg by mouth 2 (two) times  daily. 11/18/22   Jones Bales, NP  insulin aspart (NOVOLOG) 100 UNIT/ML injection Inject 5 Units into the skin 3 (three) times daily with meals. 06/04/22   Leroy Sea, MD  insulin glargine-yfgn (SEMGLEE) 100 UNIT/ML injection Inject 0.15 mLs (15 Units total) into the skin 2 (two) times daily. 07/05/22   Rhetta Mura, MD  insulin lispro (HUMALOG) 100 UNIT/ML KwikPen Before each meal 3 times a day, 140-199 - 2 units, 200-250 - 6 units, 251-299 - 8 units,  300-349 - 12 units,  350 or above 14 units. Patient taking differently: Inject 2-14 Units into the skin See admin instructions. Before each meal and at bedtime. 140-199 - 2 units, 200-250 - 6 units, 251-299 - 8 units,  300-349 - 12 units,  350 or above 14 units. 06/04/22   Leroy Sea, MD  loperamide (IMODIUM A-D) 2 MG tablet Take 2 mg by mouth every 6 (six) hours as needed for diarrhea or loose stools.  [provider]  melatonin 3 MG TABS tablet Take by mouth. 10/21/20   [provider]  midodrine (PROAMATINE) 5 MG tablet Take 1 tablet (5 mg total) by mouth 3 (three) times daily with meals. 11/30/22   Azucena Fallen, MD  mirtazapine (REMERON) 15 MG tablet Take 15 mg by mouth at bedtime. 10/18/22   [provider]  Multiple Vitamin (MULTIVITAMIN WITH MINERALS) TABS tablet Take 1 tablet by mouth daily. 05/28/22   Kathlen Mody, MD  omeprazole (PRILOSEC) 20 MG capsule Take by mouth. 08/05/22   [provider]  ondansetron (ZOFRAN ODT) 4 MG disintegrating tablet Take 1 tablet (4 mg total) by mouth every 8 (eight) hours as needed for nausea or vomiting. 05/30/21   [name], MD  Vitamin D, Ergocalciferol, (DRISDOL) 1.25 MG (50000 UNIT) CAPS capsule Take 50,000 Units by mouth once a week. 04/07/21   [provider]    Past Medical History:  Diagnosis Date   Acute back pain with sciatica, left    Acute back pain with sciatica, right    AKI (acute kidney injury) (HCC)    Anemia,  unspecified    Cancer (HCC)    Carcinoid tumor of duodenum    Chest pain with normal coronary angiography 2019   Chronic kidney disease, stage 3b (HCC)    Chronic pain    Chronic systolic CHF (congestive heart failure) (HCC)    Diabetes mellitus    DKA (diabetic ketoacidosis) (HCC)    Drug-seeking behavior    21 hospitalizations and 14 CT a/p in 2 years for N/V and abdominal pain, demanding only IV dilaudid   Elevated troponin    chronic   Esophageal reflux    Fibromyalgia    Gastric ulcer    Gastroparesis    Gout    Hyperlipidemia    Hypertension    Hypokalemia    Hypomagnesemia    Lumbosacral stenosis    LVH (left ventricular hypertrophy)    Morbid obesity (HCC)    NICM (nonischemic cardiomyopathy) (HCC)    PAF (paroxysmal atrial fibrillation) (HCC)    Stroke (HCC) 02/2011   Thrombocytosis    Vitamin B12 deficiency anemia     Past Surgical History:  Procedure Laterality Date   ABDOMINAL AORTOGRAM W/LOWER EXTREMITY N/A 11/29/2022   Procedure: ABDOMINAL AORTOGRAM W/LOWER EXTREMITY;  Surgeon: Daria Pastures, MD;  Location: [facility];  Service: Cardiovascular;  Laterality: N/A;   AV FISTULA PLACEMENT Left 06/30/2022   Procedure: LEFT BRACHIOCEPHALIC ARTERIOVENOUS (AV) FISTULA CREATION;  Surgeon: Cephus Shelling, MD;  Location: [facility] OR;  Service: Vascular;  Laterality: Left;   BIOPSY  07/27/2019   Procedure: BIOPSY;  Surgeon: Vida Rigger, MD;  Location: [facility];  Service: Endoscopy;;   BIOPSY  07/30/2019   Procedure: BIOPSY;  Surgeon: Kathi Der, MD;  Location: [facility];  Service: Gastroenterology;;   CATARACT EXTRACTION  01/2014   CHOLECYSTECTOMY     COLONOSCOPY WITH PROPOFOL N/A 07/30/2019   Procedure: COLONOSCOPY WITH PROPOFOL;  Surgeon: Kathi Der, MD;  Location: [facility];  Service: Gastroenterology;  Laterality: N/A;   ESOPHAGOGASTRODUODENOSCOPY N/A 07/27/2019   Procedure: ESOPHAGOGASTRODUODENOSCOPY (EGD);  Surgeon: Vida Rigger, MD;   Location: [facility];  Service: Endoscopy;  Laterality: N/A;   ESOPHAGOGASTRODUODENOSCOPY N/A 07/26/2020   Procedure: ESOPHAGOGASTRODUODENOSCOPY (EGD);  Surgeon: Willis Modena, MD;  Location: [facility];  Service: Endoscopy;  Laterality: N/A;   ESOPHAGOGASTRODUODENOSCOPY (EGD) WITH PROPOFOL N/A 08/02/2019   Procedure: ESOPHAGOGASTRODUODENOSCOPY (EGD) WITH PROPOFOL;  Surgeon: Kathi Der,  MD;  Location: [facility];  Service: Gastroenterology;  Laterality: N/A;   HEMOSTASIS CLIP PLACEMENT  08/02/2019   Procedure: HEMOSTASIS CLIP PLACEMENT;  Surgeon: Kathi Der, MD;  Location: [facility];  Service: Gastroenterology;;   IR FLUORO GUIDE CV LINE RIGHT  06/24/2022   IR US GUIDE VASC ACCESS RIGHT  06/24/2022   POLYPECTOMY  07/30/2019   Procedure: POLYPECTOMY;  Surgeon: Kathi Der, MD;  Location: [facility];  Service: Gastroenterology;;   POLYPECTOMY  08/02/2019   Procedure: POLYPECTOMY;  Surgeon: Kathi Der, MD;  Location: [facility];  Service: Gastroenterology;;     reports that she has never smoked. She has never used smokeless tobacco. She reports that she does not drink alcohol and does not use drugs.  Family History  Problem Relation Age of Onset   Diabetes Mother    Diabetes Father    Heart disease Father    Diabetes Sister    Congestive Heart Failure Sister 70   Diabetes Brother      Physical Exam: Vitals:   12/14/22 2104 12/14/22 2130 12/14/22 2145 12/14/22 2200  BP: 107/64 (!) 112/55 113/61 (!) 106/58  Pulse: 83 87 87 85  Resp: 20     Temp: 97.8 F (36.6 C)     TempSrc: Oral     SpO2: 98% 98% 100% 98%  Weight:      Height:        Gen: Somnolent, arouses to nonpainful stim then falls back asleep quickly. chronically ill appearing.   CV: Regular, normal S1, S2, no murmurs  Resp: Normal WOB, CTAB  Abd: Obese, normoactive, nontender MSK: Symmetric, 1+ pitting edema near the ankle, with tenderness to palpation. Skin changes of venous  stasis..  Skin: See pictures in media. There is a R heel wound approx 10 cm with areas of eschar, malodorous. No purulence from wound. No erythema / induration of the surrounding skin. Along the sacrum there are superficial erosions without frank ulceration. No active bleeding. There is light brown stool seen.  Neuro: Somnolent, arouses to nonpainful stim then falls back asleep quickly (per RN was awake and talking prior to dilaudid). Oriented to self only. Moving all extremities, localizes pain in all extremities.  Psych: Unable to assess due to AMS    Data review:   Labs reviewed, notable for:   WBC 21, neutrophilic, similar to prior encounter  Hb 5.8, nomocytic  PLT 285, down from prior 479   Na 132  HCO3 15, AG 12  Cr 6.8, hx ESRD  Gluc 284  BNP 368    Micro:  Results for orders placed or performed during the hospital encounter of 11/22/22  Resp panel by RT-PCR (RSV, Flu A&B, Covid) Anterior Nasal Swab     Status: None   Collection Time: 11/23/22 12:23 AM   Specimen: Anterior Nasal Swab  Result Value Ref Range Status   SARS Coronavirus 2 by RT PCR NEGATIVE NEGATIVE Final   Influenza A by PCR NEGATIVE NEGATIVE Final   Influenza B by PCR NEGATIVE NEGATIVE Final    Comment: (NOTE) The Xpert Xpress SARS-CoV-2/FLU/RSV plus assay is intended as an aid in the diagnosis of influenza from Nasopharyngeal swab specimens and should not be used as a sole basis for treatment. Nasal washings and aspirates are unacceptable for Xpert Xpress SARS-CoV-2/FLU/RSV testing.  Fact Sheet for Patients: BloggerCourse.com  Fact Sheet for Healthcare Providers: SeriousBroker.it  This test is not yet approved or cleared by the [place] FDA and has been authorized for detection and/or  diagnosis of SARS-CoV-2 by FDA under an Emergency Use Authorization (EUA). This EUA will remain in effect (meaning this test can be used) for the duration of  the COVID-19 declaration under Section 564(b)(1) of the Act, 21 U.S.C. section 360bbb-3(b)(1), unless the authorization is terminated or revoked.     Resp Syncytial Virus by PCR NEGATIVE NEGATIVE Final    Comment: (NOTE) Fact Sheet for Patients: BloggerCourse.com  Fact Sheet for Healthcare Providers: SeriousBroker.it  This test is not yet approved or cleared by the [place] FDA and has been authorized for detection and/or diagnosis of SARS-CoV-2 by FDA under an Emergency Use Authorization (EUA). This EUA will remain in effect (meaning this test can be used) for the duration of the COVID-19 declaration under Section 564(b)(1) of the Act, 21 U.S.C. section 360bbb-3(b)(1), unless the authorization is terminated or revoked.  Performed at [facility], [address], [place], [place] [address]   Blood Culture (routine x 2)     Status: None   Collection Time: 11/23/22 12:23 AM   Specimen: BLOOD  Result Value Ref Range Status   Specimen Description BLOOD SITE NOT SPECIFIED  Final   Special Requests   Final    BOTTLES DRAWN AEROBIC AND ANAEROBIC Blood Culture adequate volume   Culture   Final    NO GROWTH 5 DAYS Performed at [facility], [address] [place], [place] [address]    Report Status 11/28/2022 FINAL  Final    Imaging reviewed:  CT ABDOMEN PELVIS WO CONTRAST  Result Date: 12/14/2022 CLINICAL DATA:  Acute, nonlocalized abdominal pain. Weakness with diarrhea for 4 days, nausea, and vomiting. EXAM: CT ABDOMEN AND PELVIS WITHOUT CONTRAST TECHNIQUE: Multidetector CT imaging of the abdomen and pelvis was performed following the standard protocol without IV contrast. RADIATION DOSE REDUCTION: This exam was performed according to the departmental dose-optimization program which includes automated exposure control, adjustment of the mA and/or kV according to patient size and/or use of iterative reconstruction  technique. COMPARISON:  11/25/2022 FINDINGS: Lower chest:  No contributory findings. Hepatobiliary: No focal liver abnormality.Cholecystectomy. No biliary dilatation Pancreas: Unremarkable. Spleen: Unremarkable. Adrenals/Urinary Tract: Negative adrenals. No hydronephrosis or stone. Symmetric perinephric stranding that is nonspecific. Unremarkable bladder. Stomach/Bowel:  No obstruction. No appendicitis. Vascular/Lymphatic: No acute vascular abnormality. Atheromatous calcification of the aorta and iliacs. No mass or adenopathy. Reproductive:No pathologic findings. Exophytic uterine fibroid towards the left at the fundus measuring nearly 5 cm Other: No ascites or pneumoperitoneum. Musculoskeletal: No acute abnormalities. Lumbar spine degeneration with mild L4-5 anterolisthesis. IMPRESSION: No acute finding or change since prior. No bowel obstruction or visible inflammation. Electronically Signed   By: Tiburcio Pea M.D.   On: 12/14/2022 19:18    Historical Data:  5/'22 EGD  Findings: LA Grade A ( one or more mucosal breaks less than 5 mm, not extending between tops of 2 mucosal folds) esophagitis with no bleeding was found. The exam of the esophagus was otherwise normal. A large amount of food ( residue) was found in the gastric fundus, in the gastric body and in the gastric antrum. Views of the stomach were very limited; no obvious blood or lesion was seen. Food ( residue) was found in the duodenal bulb; views very limited; no obvious blood or lesion was seen.  Impression: - LA Grade A esophagitis with no bleeding. Suspected cause of black emesis. - A large amount of food ( residue) in the stomach. - Retained food in the duodenum.  5/'21  Colonoscopy  Findings: The perianal and digital rectal examinations were normal. The terminal ileum appeared normal. The quality of the bowel preparation was fair. There is no endoscopic evidence of bleeding in the entire colon. Two polyps were found in the  descending colon. The polyps were 2 to 3 mm in size. Biopsies were taken with a cold forceps for histology. A 10 mm polyp was found in the proximal sigmoid colon. The polyp was sessile. The polyp was removed with a hot snare. Resection and retrieval were complete. Internal hemorrhoids were found during retroflexion. The hemorrhoids were medium- sized.  Impression: - Preparation of the colon was fair. - The examined portion of the ileum was normal. - Two 2 to 3 mm polyps in the descending colon. Biopsied. - One 10 mm polyp in the proximal sigmoid colon, removed with a hot snare. Resected and retrieved. - Internal hemorrhoids.  ED Course:  Treated with 1 L IV fluid, Dilaudid 1 mg IV.   Assessment/Plan:  57 y.o. female with hx ESRD on M/W/F HD, PAF on Eliquis, HFpEF, HTN, DM-2, malignant carcinoid, NAFLD, morbid obesity, chronic ulceration R ankle, recent admission 9/16 -24 initially with suspected sepsis from SSTI which was ruled out. She presents with acute abd pain, collateral report of 1 week of hematochezia. Found to have anemia with Hb 5.8, suspected LGIB associated with anticoagulant use.   GI bleed unknown source  Acute on chronic anemia, component of acute blood loss  1 week reported hematochezia, Hb 5.8 down from baseline 7-8. CT A/p without contrast with no acute findings. Hx anemia of CKD in background. Acute blood loss superimposed on this. Last eliquis presumably 10/8 AM. Although suspect LGIB, does have hx esophagitis and prior UGIB per chart review in past. See EGD and colonoscopy included in data above. With limited collateral will treat as unknown source of bleed.  - ED messaged [facility] - Dr. Rhea Belton for consult in AM.  - Type and screened, consented in ED. Serial CBC  - Hold home Eliquis  - Ordered for 1 U RBC tonight, will need additional order for 2nd unit in AM ideally to be given with HD (has missed HD with last on 10/4 and risk of volume overload). If she develops worsening  VS trend will transfuse 2nd unit tonight.  - Start Pantoprazole 40 mg IV q 12 hr due to unknown source / limited collateral  - NPO for possible scope in AM   Hypotension, resolved - likely hypovolemic On initial presentation BP 80/50's, tachycardic in 100's. Improved s/p 1 L IVF.  - Blood transfusion per above. Will hold on additional fluids, oral hydration as able.   Acute toxic encephalopathy, 2/2 opiate medication  Acute AMS in ED, following admin of dilaudid. Suspect toxic encephalopathy from this medication.  - If not improving as this medication wears off, will draw VBG to eval for hypercarbia as alternative cause. Does not use CPAP / BiPAP per chart review.  - Cautiously will reduce home Dilaudid to q12 hr PO, hold if worsening AMS.  - Hold home Gabapentin   Afib on AC:  - Hold home Apixaban in setting of bleed  - No longer on bblocker after last admission.   Leukocytosis, chronic  Hx chronic leukocytosis, but recent uptrend likely in setting of steroids. At discharge WBC was 21. Neutrophilic predominant. Etiology of chronic leukocytosis ? Reactive with chronic ulcerative wounds. Recent uptrend likely due to steroids.  - Add smear to eval for abnormal leukocyte morphology.  - Consider hematology  referral for chronic leukocytosis if does not resolve off steroids.   Hx ESRD, on M/W/F HD  - Nephrology consulted, plan for HD in AM.  - Please arrange for 2nd unit RBC to be transfused with HD in AM. - Continue home Midodrine 5 mg TID    DM with hyperglycemia  Home regimen is Semglee 15 units BID, aspart 5 units TID.  - Reduced dose form home regimen, currently Semglee 15 units at bedtime, Aspart 2 units with meals + SSI. Uptitrate as needed.   Chronic medical problems: Metastatic carcinoid, hx duodenal resection: OP f/u  HFpEF: Volume management with HD.  Chronic pain: Continue home Dilaudid, offer 2 v 4 mg PO prn q 8 hr (home is 4mg )  HLD, NAFLD: Continue home Atorvastatin   Mood d/o: Continue home Duloxetine, Mirtazapine  Gout: Continue home Allopurinol  Chronic wounds: Wound care consult     Body mass index is 45.37 kg/m. Class 3 obesity affecting medical care    DVT prophylaxis:  SCDs Code Status:  Full Code; presumed full  Diet:  Diet Orders (From admission, onward)     Start     Ordered   12/14/22 2037  Diet renal with fluid restriction Fluid restriction: 1200 mL Fluid; Room service appropriate? Yes; Fluid consistency: Thin  Diet effective now       Question Answer Comment  Fluid restriction: 1200 mL Fluid   Room service appropriate? Yes   Fluid consistency: Thin      12/14/22 2039           Family Communication:  Attempted to call mother, phone is off.   Consults:  [facility]   Admission status:   Inpatient, Telemetry bed  Severity of Illness: The appropriate patient status for this patient is INPATIENT. Inpatient status is judged to be reasonable and necessary in order to provide the required intensity of service to ensure the patient's safety. The patient's presenting symptoms, physical exam findings, and initial radiographic and laboratory data in the context of their chronic comorbidities is felt to place them at high risk for further clinical deterioration. Furthermore, it is not anticipated that the patient will be medically stable for discharge from the hospital within 2 midnights of admission.   * I certify that at the point of admission it is my clinical judgment that the patient will require inpatient hospital care spanning beyond 2 midnights from the point of admission due to high intensity of service, high risk for further deterioration and high frequency of surveillance required.*   Dolly Rias, MD [facility]  How to contact the [facility] Attending or Consulting provider 7A - 7P or covering provider during after hours 7P -7A, for this patient.  Check the care team in [facility] and look for a) attending/consulting TR[facility]  provider listed and b) the [facility] team listed Log into [email or web address] and use [facility] universal password to access. If you do not have the password, please contact the hospital operator. Locate the [facility] provider you are looking for under [facility] and page to a number that you can be directly reached. If you still have difficulty reaching the provider, please page the [facility] (Director on Call) for the Hospitalists listed on amion for assistance.  12/14/2022, 11:58 PM

## 2022-12-14 NOTE — ED Notes (Signed)
Blood started.Per Dolly Rias MD , run at 75 and monitor PT for issues with possible fluid overload.MD Segars also  only wants 1 unit of blood transfused tonight and the other unit can be given tomorrow.PT's right foot also needs to be bandaged.

## 2022-12-15 ENCOUNTER — Inpatient Hospital Stay (HOSPITAL_COMMUNITY): Payer: 59

## 2022-12-15 DIAGNOSIS — K529 Noninfective gastroenteritis and colitis, unspecified: Secondary | ICD-10-CM | POA: Diagnosis not present

## 2022-12-15 DIAGNOSIS — Z992 Dependence on renal dialysis: Secondary | ICD-10-CM

## 2022-12-15 DIAGNOSIS — N186 End stage renal disease: Secondary | ICD-10-CM | POA: Diagnosis not present

## 2022-12-15 DIAGNOSIS — R1084 Generalized abdominal pain: Secondary | ICD-10-CM | POA: Diagnosis not present

## 2022-12-15 DIAGNOSIS — D649 Anemia, unspecified: Secondary | ICD-10-CM | POA: Diagnosis not present

## 2022-12-15 DIAGNOSIS — D631 Anemia in chronic kidney disease: Secondary | ICD-10-CM | POA: Diagnosis not present

## 2022-12-15 LAB — DIFFERENTIAL
Abs Immature Granulocytes: 0.48 10*3/uL — ABNORMAL HIGH (ref 0.00–0.07)
Basophils Absolute: 0.1 10*3/uL (ref 0.0–0.1)
Basophils Relative: 0 %
Eosinophils Absolute: 0.2 10*3/uL (ref 0.0–0.5)
Eosinophils Relative: 1 %
Immature Granulocytes: 2 %
Lymphocytes Relative: 6 %
Lymphs Abs: 1.4 10*3/uL (ref 0.7–4.0)
Monocytes Absolute: 0.7 10*3/uL (ref 0.1–1.0)
Monocytes Relative: 3 %
Neutro Abs: 21.3 10*3/uL — ABNORMAL HIGH (ref 1.7–7.7)
Neutrophils Relative %: 88 %

## 2022-12-15 LAB — CBC
HCT: 23.2 % — ABNORMAL LOW (ref 36.0–46.0)
Hemoglobin: 7 g/dL — ABNORMAL LOW (ref 12.0–15.0)
MCH: 28.2 pg (ref 26.0–34.0)
MCHC: 30.2 g/dL (ref 30.0–36.0)
MCV: 93.5 fL (ref 80.0–100.0)
Platelets: 319 10*3/uL (ref 150–400)
RBC: 2.48 MIL/uL — ABNORMAL LOW (ref 3.87–5.11)
RDW: 17.7 % — ABNORMAL HIGH (ref 11.5–15.5)
WBC: 24.1 10*3/uL — ABNORMAL HIGH (ref 4.0–10.5)
nRBC: 0.1 % (ref 0.0–0.2)

## 2022-12-15 LAB — RETICULOCYTES
Immature Retic Fract: 9.8 % (ref 2.3–15.9)
RBC.: 2.49 MIL/uL — ABNORMAL LOW (ref 3.87–5.11)
Retic Count, Absolute: 53 10*3/uL (ref 19.0–186.0)
Retic Ct Pct: 2.1 % (ref 0.4–3.1)

## 2022-12-15 LAB — BASIC METABOLIC PANEL
Anion gap: 14 (ref 5–15)
BUN: 76 mg/dL — ABNORMAL HIGH (ref 6–20)
CO2: 16 mmol/L — ABNORMAL LOW (ref 22–32)
Calcium: 7.5 mg/dL — ABNORMAL LOW (ref 8.9–10.3)
Chloride: 106 mmol/L (ref 98–111)
Creatinine, Ser: 6.74 mg/dL — ABNORMAL HIGH (ref 0.44–1.00)
GFR, Estimated: 7 mL/min — ABNORMAL LOW (ref >60)
Glucose, Bld: 160 mg/dL — ABNORMAL HIGH (ref 70–99)
Potassium: 4.1 mmol/L (ref 3.5–5.1)
Sodium: 136 mmol/L (ref 135–145)

## 2022-12-15 LAB — IRON AND TIBC
Iron: 22 ug/dL — ABNORMAL LOW (ref 28–170)
Saturation Ratios: 16 % (ref 10.4–31.8)
TIBC: 141 ug/dL — ABNORMAL LOW (ref 250–450)
UIBC: 119 ug/dL

## 2022-12-15 LAB — TECHNOLOGIST SMEAR REVIEW: Clinical Information: ABNORMAL

## 2022-12-15 LAB — CBG MONITORING, ED
Glucose-Capillary: 95 mg/dL (ref 70–99)
Glucose-Capillary: 89 mg/dL (ref 70–99)
Glucose-Capillary: 79 mg/dL (ref 70–99)
Glucose-Capillary: 83 mg/dL (ref 70–99)

## 2022-12-15 LAB — PHOSPHORUS: Phosphorus: 8.2 mg/dL — ABNORMAL HIGH (ref 2.5–4.6)

## 2022-12-15 LAB — VITAMIN B12: Vitamin B-12: 217 pg/mL (ref 180–914)

## 2022-12-15 LAB — GLUCOSE, CAPILLARY: Glucose-Capillary: 85 mg/dL (ref 70–99)

## 2022-12-15 LAB — PROTIME-INR
INR: 1.9 — ABNORMAL HIGH (ref 0.8–1.2)
Prothrombin Time: 21.9 s — ABNORMAL HIGH (ref 11.4–15.2)

## 2022-12-15 LAB — TRANSFERRIN: Transferrin: 97 mg/dL — ABNORMAL LOW (ref 192–382)

## 2022-12-15 LAB — FERRITIN: Ferritin: 1919 ng/mL — ABNORMAL HIGH (ref 11–307)

## 2022-12-15 LAB — MAGNESIUM: Magnesium: 1.7 mg/dL (ref 1.7–2.4)

## 2022-12-15 LAB — HEPATITIS B SURFACE ANTIGEN: Hepatitis B Surface Ag: NONREACTIVE

## 2022-12-15 MED ORDER — MEDIHONEY WOUND/BURN DRESSING EX PSTE
1.0000 | PASTE | Freq: Every day | CUTANEOUS | Status: AC
  Administered 2022-12-16 – 2022-12-25 (×9): 1 via TOPICAL
  Filled 2022-12-15 (×2): qty 44

## 2022-12-15 MED ORDER — LIDOCAINE HCL (PF) 1 % IJ SOLN: 5.0000 mL | INTRAMUSCULAR | Status: DC | PRN

## 2022-12-15 MED ORDER — ALLOPURINOL 100 MG PO TABS
100.0000 mg | ORAL_TABLET | Freq: Two times a day (BID) | ORAL | Status: DC
  Administered 2022-12-15 – 2023-01-10 (×48): 100 mg via ORAL
  Filled 2022-12-15 (×53): qty 1

## 2022-12-15 MED ORDER — INSULIN ASPART 100 UNIT/ML IJ SOLN
2.0000 [IU] | Freq: Three times a day (TID) | INTRAMUSCULAR | Status: DC
  Administered 2022-12-24 – 2023-01-10 (×22): 2 [IU] via SUBCUTANEOUS

## 2022-12-15 MED ORDER — HEPARIN SODIUM (PORCINE) 1000 UNIT/ML IJ SOLN
3800.0000 [IU] | Freq: Once | INTRAMUSCULAR | Status: DC
  Administered 2022-12-15: 3800 [IU]
  Filled 2022-12-15: qty 4

## 2022-12-15 MED ORDER — PENTAFLUOROPROP-TETRAFLUOROETH EX AERO: 1.0000 | INHALATION_SPRAY | CUTANEOUS | Status: DC | PRN

## 2022-12-15 MED ORDER — LIDOCAINE-PRILOCAINE 2.5-2.5 % EX CREA: 1.0000 | TOPICAL_CREAM | CUTANEOUS | Status: DC | PRN

## 2022-12-15 MED ORDER — HYDROMORPHONE HCL 2 MG PO TABS
4.0000 mg | ORAL_TABLET | Freq: Three times a day (TID) | ORAL | Status: DC | PRN
  Administered 2022-12-15 – 2022-12-18 (×9): 4 mg via ORAL
  Filled 2022-12-15 (×10): qty 2

## 2022-12-15 MED ORDER — ALTEPLASE 2 MG IJ SOLR: 2.0000 mg | Freq: Once | INTRAMUSCULAR | Status: DC | PRN

## 2022-12-15 MED ORDER — HYDROMORPHONE HCL 2 MG PO TABS
4.0000 mg | ORAL_TABLET | Freq: Once | ORAL | Status: AC
  Administered 2022-12-15: 4 mg via ORAL
  Filled 2022-12-15: qty 2

## 2022-12-15 MED ORDER — ANTICOAGULANT SODIUM CITRATE 4% (200MG/5ML) IV SOLN: 5.0000 mL | Status: DC | PRN

## 2022-12-15 MED ORDER — CYANOCOBALAMIN 1000 MCG/ML IJ SOLN
1000.0000 ug | Freq: Once | INTRAMUSCULAR | Status: AC
  Administered 2022-12-15: 1000 ug via INTRAMUSCULAR
  Filled 2022-12-15: qty 1

## 2022-12-15 MED ORDER — FERRIC CITRATE 1 GM 210 MG(FE) PO TABS
420.0000 mg | ORAL_TABLET | Freq: Three times a day (TID) | ORAL | Status: DC
  Administered 2022-12-15 – 2023-01-05 (×40): 420 mg via ORAL
  Filled 2022-12-15 (×54): qty 2

## 2022-12-15 MED ORDER — INSULIN ASPART 100 UNIT/ML IJ SOLN
0.0000 [IU] | Freq: Three times a day (TID) | INTRAMUSCULAR | Status: DC
  Administered 2022-12-23: 2 [IU] via SUBCUTANEOUS
  Administered 2022-12-25: 1 [IU] via SUBCUTANEOUS
  Administered 2022-12-25: 2 [IU] via SUBCUTANEOUS
  Administered 2022-12-26 – 2022-12-28 (×2): 3 [IU] via SUBCUTANEOUS
  Administered 2023-01-01 – 2023-01-06 (×6): 1 [IU] via SUBCUTANEOUS
  Administered 2023-01-07: 2 [IU] via SUBCUTANEOUS
  Administered 2023-01-08: 1 [IU] via SUBCUTANEOUS
  Administered 2023-01-09 (×2): 2 [IU] via SUBCUTANEOUS
  Administered 2023-01-09: 1 [IU] via SUBCUTANEOUS

## 2022-12-15 MED ORDER — HEPARIN SODIUM (PORCINE) 1000 UNIT/ML DIALYSIS: 1000.0000 [IU] | INTRAMUSCULAR | Status: DC | PRN

## 2022-12-15 MED ORDER — HYDROMORPHONE HCL 2 MG PO TABS: 2.0000 mg | ORAL_TABLET | Freq: Three times a day (TID) | ORAL | Status: DC | PRN

## 2022-12-15 MED ORDER — GABAPENTIN 100 MG PO CAPS: 100.0000 mg | ORAL_CAPSULE | Freq: Three times a day (TID) | ORAL | Status: DC

## 2022-12-15 MED ORDER — ALBUTEROL SULFATE (2.5 MG/3ML) 0.083% IN NEBU: 2.5000 mg | INHALATION_SOLUTION | Freq: Four times a day (QID) | RESPIRATORY_TRACT | Status: DC | PRN

## 2022-12-15 MED ORDER — DULOXETINE HCL 20 MG PO CPEP
40.0000 mg | ORAL_CAPSULE | Freq: Every day | ORAL | Status: DC
  Administered 2022-12-16 – 2023-01-01 (×15): 40 mg via ORAL
  Filled 2022-12-15 (×17): qty 2

## 2022-12-15 MED ORDER — ATORVASTATIN CALCIUM 10 MG PO TABS
10.0000 mg | ORAL_TABLET | Freq: Every day | ORAL | Status: DC
  Administered 2022-12-15 – 2023-01-09 (×24): 10 mg via ORAL
  Filled 2022-12-15 (×27): qty 1

## 2022-12-15 MED ORDER — GERHARDT'S BUTT CREAM
TOPICAL_CREAM | Freq: Two times a day (BID) | CUTANEOUS | Status: AC
  Administered 2022-12-16 – 2022-12-24 (×3): 1 via TOPICAL
  Filled 2022-12-15 (×3): qty 1

## 2022-12-15 MED ORDER — MIRTAZAPINE 15 MG PO TABS
15.0000 mg | ORAL_TABLET | Freq: Every day | ORAL | Status: DC
  Administered 2022-12-15 – 2022-12-17 (×4): 15 mg via ORAL
  Filled 2022-12-15 (×4): qty 1

## 2022-12-15 MED ORDER — HYDROXYZINE HCL 10 MG PO TABS
10.0000 mg | ORAL_TABLET | Freq: Four times a day (QID) | ORAL | Status: AC | PRN
  Administered 2022-12-15: 10 mg via ORAL
  Filled 2022-12-15: qty 1

## 2022-12-15 MED ORDER — BUSPIRONE HCL 5 MG PO TABS
5.0000 mg | ORAL_TABLET | Freq: Two times a day (BID) | ORAL | Status: DC
  Administered 2022-12-15 – 2022-12-22 (×16): 5 mg via ORAL
  Filled 2022-12-15 (×18): qty 1

## 2022-12-15 MED ORDER — PANTOPRAZOLE SODIUM 40 MG IV SOLR
40.0000 mg | Freq: Two times a day (BID) | INTRAVENOUS | Status: DC
  Administered 2022-12-15 – 2022-12-16 (×3): 40 mg via INTRAVENOUS
  Filled 2022-12-15 (×3): qty 10

## 2022-12-15 MED ORDER — MIDODRINE HCL 5 MG PO TABS
5.0000 mg | ORAL_TABLET | Freq: Three times a day (TID) | ORAL | Status: DC
  Administered 2022-12-15 – 2023-01-02 (×48): 5 mg via ORAL
  Filled 2022-12-15 (×52): qty 1

## 2022-12-15 MED ORDER — INSULIN GLARGINE-YFGN 100 UNIT/ML ~~LOC~~ SOLN
15.0000 [IU] | Freq: Every day | SUBCUTANEOUS | Status: DC
  Administered 2022-12-15 – 2022-12-18 (×3): 15 [IU] via SUBCUTANEOUS
  Filled 2022-12-15 (×8): qty 0.15

## 2022-12-15 MED ORDER — FOLIC ACID 1 MG PO TABS
1.0000 mg | ORAL_TABLET | Freq: Every day | ORAL | Status: DC
  Administered 2022-12-16 – 2023-01-10 (×24): 1 mg via ORAL
  Filled 2022-12-15 (×26): qty 1

## 2022-12-15 NOTE — Consult Note (Signed)
WOC Nurse Consult Note: Reason for Consult: right ankle wound Patient known to WOC nursing from previous admission; right heel/ankle wound present at that time. Dr. Lajoyce Corners follows this patient; last seen 12/07/22 for same Also noted to have ICD; irritant contact dermatitis over the bilateral buttocks Healed wounds over the sacral region Noted from ED; ICD under the pannus/in the groin area  Wound type: Full thickness ulceration; right heel; Unstageable Pressure Injury: chronic non healing ICD buttocks/bilateral posterior thighs;  ICD-10 CM Codes for Irritant Dermatitis L24A2 - Due to fecal, urinary or dual incontinence L30.4  - Erythema intertrigo. Also used for abrasion of the hand, chafing of the skin, dermatitis due to sweating and friction, friction dermatitis, friction eczema, and genital/thigh intertrigo.  Pressure Injury POA: NA Measurement:see nursing flow sheets Wound bed: scattered areas of partial thickness skin loss over the bilateral buttocks No image of skin irritation under the pannus or in the groin; non viable tissue over the right heel Drainage (amount, consistency, odor) scant, non purulent  Periwound: Dressing procedure/placement/frequency: Cleanse right heel wound with Vashe; apply Medihoney daily, top with saline moist gauze dressing and dry dressing Gerhardt's butt cream to the affected areas of the buttocks Low air loss mattress for moisture management and pressure redistribution Chair pressure redistribution pad for when patient is up in chair or up for HD. To be sent home with patient.   FU with Dr. Lajoyce Corners as scheduled for right foot wound on 01/04/23  Re consult if needed, will not follow at this time. Thanks  Deveron Shamoon [employer], RN,CWOCN, CNS, CWON-AP [phone])

## 2022-12-15 NOTE — ED Notes (Signed)
PT's leg and buttocks area was cleaned and PT was educated about the importance of keeping her skin dry and clean

## 2022-12-15 NOTE — ED Notes (Signed)
ED TO INPATIENT HANDOFF REPORT  ED Nurse Name and Phone #: chris  [phone]  S Name/Age/Gender Deborah [name] 57 y.o. female Room/Bed: 037C/037C  Code Status   Code Status: Full Code  Home/SNF/Other Home Patient oriented to: self, place, time, and situation Is this baseline? Yes   Triage Complete: Triage complete  Chief Complaint Generalized abdominal pain [R10.84] Symptomatic anemia [D64.9]  Triage Note  Patient brought in by [facility] for weakness. Patient is dialysis patient MWF, last treatment Friday. Patient endorses weakness, diarrhea x 4 days, nausea, vomiting. Patient noted to have increased confusion over last month per caregiver.   EMS 120/65 100 hr 16 rr Spo2 99 on room air   Allergies Allergies  Allergen Reactions   Diazepam [name]   Gabapentin [name] and Swelling    Other reaction(s): Unknown   Iodinated Contrast Media Anaphylaxis and [name]    11/29/17 Cardiac arrest 1 min after IV contrast, possible allergy vs vasovagal episode  Iopamidol   Anaphylaxis   High 11/28/2017   Patient had seizure like activity and then code post 100 cc of isovue 300   Iopamidol Anaphylaxis    11/28/17 Patient had seizure like activity and then 1 min code after 100 cc of isovue 300. Possible contrast allergy vs vasovagal episode  Other Reaction(s): Cardiac Arrest   Lisinopril Anaphylaxis    Tongue and mouth swelling  Other Reaction(s): Laryngeal Edema   Metoclopramide Other (See Comments)    Tardive dyskinesia  Also known as Reglan  Other Reaction(s): Unknown   Nsaids Anaphylaxis and Other (See Comments)    ULCER  Other Reaction(s): Unknown   Penicillins Palpitations and Itching    Has patient had a PCN reaction causing immediate rash, facial/tongue/throat swelling, SOB or lightheadedness with hypotension: Yes, heart races  Has patient had a PCN reaction causing severe rash involving mucus membranes or skin  necrosis: No  Has patient had a PCN reaction that required hospitalization: Yes   Has patient had a PCN reaction occurring within the last 10 years: No  Other Reaction(s): Flushing (Red Skin), Laryngeal Edema   Chlorhexidine Dermatitis   Tolmetin Nausea Only, Other (See Comments) and Nausea And Vomiting    ULCER  Other Reaction(s): Unknown   Aspartame And Phenylalanine Hives   Aspirin Other (See Comments)    Irritates stomach ulcer    Dicyclomine Other (See Comments)    Chest pain  Other Reaction(s): Unknown   Rifamycin     Other Reaction(s): palpitations   Acetaminophen Nausea Only, Other (See Comments) and Nausea And Vomiting    Irritates stomach ulcer; Abdominal pain  Other Reaction(s): Abdominal Pain, ulcer   Cyclobenzaprine Palpitations    Other Reaction(s): palpitations   Oxycodone Palpitations    Other Reaction(s): palpitations   Rifamycins Palpitations   Tramadol Nausea And Vomiting    Other Reaction(s): palpitations    Level of Care/Admitting Diagnosis ED Disposition     ED Disposition  Admit   Condition  --   Comment  Hospital Area: [facility] [[id number]]  Level of Care: Telemetry Medical [104]  May admit patient to [facility] or [facility] if equivalent level of care is available:: No  Covid Evaluation: Asymptomatic - no recent exposure (last 10 days) testing not required  Diagnosis: Symptomatic anemia [9604540]  Admitting Physician: Dolly Rias [[id number]]  Attending Physician: Dolly Rias [[id number]]  Certification:: I certify this patient will need inpatient services for at least 2 midnights  Expected  Medical Readiness: 12/16/2022          B Medical/Surgery History Past Medical History:  Diagnosis Date   Acute back pain with sciatica, left    Acute back pain with sciatica, right    AKI (acute kidney injury) (HCC)    Anemia, unspecified    Cancer (HCC)    Carcinoid tumor of duodenum    Chest pain with normal  coronary angiography 2019   Chronic kidney disease, stage 3b (HCC)    Chronic pain    Chronic systolic CHF (congestive heart failure) (HCC)    Diabetes mellitus    DKA (diabetic ketoacidosis) (HCC)    Drug-seeking behavior    21 hospitalizations and 14 CT a/p in 2 years for N/V and abdominal pain, demanding only IV dilaudid   Elevated troponin    chronic   Esophageal reflux    Fibromyalgia    Gastric ulcer    Gastroparesis    Gout    Hyperlipidemia    Hypertension    Hypokalemia    Hypomagnesemia    Lumbosacral stenosis    LVH (left ventricular hypertrophy)    Morbid obesity (HCC)    NICM (nonischemic cardiomyopathy) (HCC)    PAF (paroxysmal atrial fibrillation) (HCC)    Stroke (HCC) 02/2011   Thrombocytosis    Vitamin B12 deficiency anemia    Past Surgical History:  Procedure Laterality Date   ABDOMINAL AORTOGRAM W/LOWER EXTREMITY N/A 11/29/2022   Procedure: ABDOMINAL AORTOGRAM W/LOWER EXTREMITY;  Surgeon: Daria Pastures, MD;  Location: [facility];  Service: Cardiovascular;  Laterality: N/A;   AV FISTULA PLACEMENT Left 06/30/2022   Procedure: LEFT BRACHIOCEPHALIC ARTERIOVENOUS (AV) FISTULA CREATION;  Surgeon: Cephus Shelling, MD;  Location: [facility] OR;  Service: Vascular;  Laterality: Left;   BIOPSY  07/27/2019   Procedure: BIOPSY;  Surgeon: Vida Rigger, MD;  Location: [facility];  Service: Endoscopy;;   BIOPSY  07/30/2019   Procedure: BIOPSY;  Surgeon: Kathi Der, MD;  Location: [facility];  Service: Gastroenterology;;   CATARACT EXTRACTION  01/2014   CHOLECYSTECTOMY     COLONOSCOPY WITH PROPOFOL N/A 07/30/2019   Procedure: COLONOSCOPY WITH PROPOFOL;  Surgeon: Kathi Der, MD;  Location: [facility];  Service: Gastroenterology;  Laterality: N/A;   ESOPHAGOGASTRODUODENOSCOPY N/A 07/27/2019   Procedure: ESOPHAGOGASTRODUODENOSCOPY (EGD);  Surgeon: Vida Rigger, MD;  Location: [facility];  Service: Endoscopy;  Laterality: N/A;    ESOPHAGOGASTRODUODENOSCOPY N/A 07/26/2020   Procedure: ESOPHAGOGASTRODUODENOSCOPY (EGD);  Surgeon: Willis Modena, MD;  Location: [facility];  Service: Endoscopy;  Laterality: N/A;   ESOPHAGOGASTRODUODENOSCOPY (EGD) WITH PROPOFOL N/A 08/02/2019   Procedure: ESOPHAGOGASTRODUODENOSCOPY (EGD) WITH PROPOFOL;  Surgeon: Kathi Der, MD;  Location: [facility];  Service: Gastroenterology;  Laterality: N/A;   HEMOSTASIS CLIP PLACEMENT  08/02/2019   Procedure: HEMOSTASIS CLIP PLACEMENT;  Surgeon: Kathi Der, MD;  Location: [facility];  Service: Gastroenterology;;   IR FLUORO GUIDE CV LINE RIGHT  06/24/2022   IR US GUIDE VASC ACCESS RIGHT  06/24/2022   POLYPECTOMY  07/30/2019   Procedure: POLYPECTOMY;  Surgeon: Kathi Der, MD;  Location: [facility];  Service: Gastroenterology;;   POLYPECTOMY  08/02/2019   Procedure: POLYPECTOMY;  Surgeon: Kathi Der, MD;  Location: [facility];  Service: Gastroenterology;;     A IV Location/Drains/Wounds Patient Lines/Drains/Airways Status     Active Line/Drains/Airways     Name Placement date Placement time Site Days   Peripheral IV 12/14/22 20 G 2.5" Right Antecubital 12/14/22  1704  Antecubital  1   Fistula /  Graft Left Upper arm Arteriovenous fistula 06/30/22  1200  Upper arm  168   Hemodialysis Catheter Right Subclavian Double lumen Permanent (Tunneled) 06/24/22  1644  Subclavian  174   Pressure Injury 11/23/22 Ischial tuberosity Left Unstageable - Full thickness tissue loss in which the base of the injury is covered by slough (yellow, tan, gray, green or brown) and/or eschar (tan, brown or black) in the wound bed. 11/23/22  0853  -- 22   Pressure Injury 11/23/22 Heel Right Unstageable - Full thickness tissue loss in which the base of the injury is covered by slough (yellow, tan, gray, green or brown) and/or eschar (tan, brown or black) in the wound bed. 11/23/22  0854  -- 22   Pressure Injury 11/24/22 Thigh  Proximal;Right;Medial;Upper Stage 2 -  Partial thickness loss of dermis presenting as a shallow open injury with a red, pink wound bed without slough. stage two injury to upper thigh into groin region. 11/24/22  1452  -- 21   Wound / Incision (Open or Dehisced) 06/19/22 Skin tear Sacrum Mid stage 2 small abraison present on admission 06/19/22  1840  Sacrum  179   Wound / Incision (Open or Dehisced) 06/19/22 Heel Left 06/19/22  2100  Heel  179            Intake/Output Last 24 hours  Intake/Output Summary (Last 24 hours) at 12/15/2022 1743 Last data filed at 12/15/2022 1226 Gross per 24 hour  Intake 451.25 ml  Output 2800 ml  Net -2348.75 ml    Labs/Imaging Results for orders placed or performed during the hospital encounter of 12/14/22 (from the past 48 hour(s))  Comprehensive metabolic panel     Status: Abnormal   Collection Time: 12/14/22  5:00 PM  Result Value Ref Range   Sodium 132 (L) 135 - 145 mmol/L   Potassium 4.0 3.5 - 5.1 mmol/L   Chloride 105 98 - 111 mmol/L   CO2 15 (L) 22 - 32 mmol/L   Glucose, Bld 284 (H) 70 - 99 mg/dL    Comment: Glucose reference range applies only to samples taken after fasting for at least 8 hours.   BUN 72 (H) 6 - 20 mg/dL   Creatinine, Ser 5.78 (H) 0.44 - 1.00 mg/dL   Calcium 7.0 (L) 8.9 - 10.3 mg/dL   Total Protein 5.0 (L) 6.5 - 8.1 g/dL   Albumin 1.5 (L) 3.5 - 5.0 g/dL   AST 14 (L) 15 - 41 U/L   ALT 14 0 - 44 U/L   Alkaline Phosphatase 81 38 - 126 U/L   Total Bilirubin 0.3 0.3 - 1.2 mg/dL   GFR, Estimated 6 (L) >60 mL/min    Comment: (NOTE) Calculated using the CKD-EPI Creatinine Equation (2021)    Anion gap 12 5 - 15    Comment: Performed at [facility], [address] [place], [place] [address]  Lipase, blood     Status: Abnormal   Collection Time: 12/14/22  5:00 PM  Result Value Ref Range   Lipase 61 (H) 11 - 51 U/L    Comment: Performed at [facility], [address], [place], [place] [address]  CBC with Diff      Status: Abnormal   Collection Time: 12/14/22  5:00 PM  Result Value Ref Range   WBC 21.0 (H) 4.0 - 10.5 K/uL   RBC 2.10 (L) 3.87 - 5.11 MIL/uL   Hemoglobin 5.8 (LL) 12.0 - 15.0 g/dL    Comment: REPEATED  TO VERIFY THIS CRITICAL RESULT HAS VERIFIED AND BEEN CALLED TO T. MORRISON, RN BY DAISY BLU ON 10 08 2024 AT 1806, AND HAS BEEN READ BACK.     HCT 19.5 (L) 36.0 - 46.0 %   MCV 92.9 80.0 - 100.0 fL   MCH 27.6 26.0 - 34.0 pg   MCHC 29.7 (L) 30.0 - 36.0 g/dL   RDW 62.1 (H) 30.8 - 65.7 %   Platelets 285 150 - 400 K/uL    Comment: REPEATED TO VERIFY   nRBC 0.0 0.0 - 0.2 %   Neutrophils Relative % 91 %   Neutro Abs 19.1 (H) 1.7 - 7.7 K/uL   Lymphocytes Relative 5 %   Lymphs Abs 1.0 0.7 - 4.0 K/uL   Monocytes Relative 3 %   Monocytes Absolute 0.6 0.1 - 1.0 K/uL   Eosinophils Relative 0 %   Eosinophils Absolute 0.1 0.0 - 0.5 K/uL   Basophils Relative 0 %   Basophils Absolute 0.1 0.0 - 0.1 K/uL   Immature Granulocytes 1 %   Abs Immature Granulocytes 0.14 (H) 0.00 - 0.07 K/uL    Comment: Performed at [facility], [address] [place], [place] [address]  Brain natriuretic peptide     Status: Abnormal   Collection Time: 12/14/22  5:00 PM  Result Value Ref Range   B Natriuretic Peptide 368.9 (H) 0.0 - 100.0 pg/mL    Comment: Performed at [facility], [address] [place], [place] [address]  Type and screen     Status: None (Preliminary result)   Collection Time: 12/14/22  7:05 PM  Result Value Ref Range   ABO/RH(D) O POS    Antibody Screen NEG    Sample Expiration 12/17/2022,2359    Unit Number [id number]    Blood Component Type RED CELLS,LR    Unit division 00    Status of Unit ALLOCATED    Donor AG Type NEGATIVE FOR E ANTIGEN    Transfusion Status OK TO TRANSFUSE    Crossmatch Result COMPATIBLE    Unit Number [id number]    Blood Component Type RED CELLS,LR    Unit division 00    Status of Unit ISSUED,FINAL    Donor AG Type NEGATIVE FOR E ANTIGEN     Transfusion Status OK TO TRANSFUSE    Crossmatch Result COMPATIBLE   Prepare RBC (crossmatch)     Status: None   Collection Time: 12/14/22  8:00 PM  Result Value Ref Range   Order Confirmation      ORDER PROCESSED BY BLOOD BANK Performed at [facility], [address], [place], [place] [address]   Hepatitis B surface antigen     Status: None   Collection Time: 12/15/22  1:57 AM  Result Value Ref Range   Hepatitis B Surface Ag NON REACTIVE NON REACTIVE    Comment: Performed at [facility], [address], [place], [place] [address]  Protime-INR     Status: Abnormal   Collection Time: 12/15/22  1:57 AM  Result Value Ref Range   Prothrombin Time 21.9 (H) 11.4 - 15.2 seconds   INR 1.9 (H) 0.8 - 1.2    Comment: (NOTE) INR goal varies based on device and disease states. Performed at [facility], [address], [place], [place] [address]   Vitamin B12     Status: None   Collection Time: 12/15/22  5:35 AM  Result Value Ref Range   Vitamin B-12 217  180 - 914 pg/mL    Comment: (NOTE) This assay is not validated for testing neonatal or myeloproliferative syndrome specimens for Vitamin B12 levels. Performed at [facility], [address], [place], [place] [address]   Reticulocytes     Status: Abnormal   Collection Time: 12/15/22  5:35 AM  Result Value Ref Range   Retic Ct Pct 2.1 0.4 - 3.1 %   RBC. 2.49 (L) 3.87 - 5.11 MIL/uL   Retic Count, Absolute 53.0 19.0 - 186.0 K/uL   Immature Retic Fract 9.8 2.3 - 15.9 %    Comment: Performed at [facility], [address] [place], [place] [address]  Basic metabolic panel     Status: Abnormal   Collection Time: 12/15/22  5:35 AM  Result Value Ref Range   Sodium 136 135 - 145 mmol/L   Potassium 4.1 3.5 - 5.1 mmol/L   Chloride 106 98 - 111 mmol/L   CO2 16 (L) 22 - 32 mmol/L   Glucose, Bld 160 (H) 70 - 99 mg/dL    Comment: Glucose reference range applies only to samples taken after fasting for at least 8  hours.   BUN 76 (H) 6 - 20 mg/dL   Creatinine, Ser 5.62 (H) 0.44 - 1.00 mg/dL   Calcium 7.5 (L) 8.9 - 10.3 mg/dL   GFR, Estimated 7 (L) >60 mL/min    Comment: (NOTE) Calculated using the CKD-EPI Creatinine Equation (2021)    Anion gap 14 5 - 15    Comment: Performed at [facility], [address] [place], [place] [address]  CBC     Status: Abnormal   Collection Time: 12/15/22  5:35 AM  Result Value Ref Range   WBC 24.1 (H) 4.0 - 10.5 K/uL   RBC 2.48 (L) 3.87 - 5.11 MIL/uL   Hemoglobin 7.0 (L) 12.0 - 15.0 g/dL   HCT 57.8 (L) 46.9 - 62.9 %   MCV 93.5 80.0 - 100.0 fL   MCH 28.2 26.0 - 34.0 pg   MCHC 30.2 30.0 - 36.0 g/dL   RDW 52.8 (H) 41.3 - 24.4 %   Platelets 319 150 - 400 K/uL   nRBC 0.1 0.0 - 0.2 %    Comment: Performed at [facility], [address], [place], [place] [address]  Differential     Status: Abnormal   Collection Time: 12/15/22  5:35 AM  Result Value Ref Range   Neutrophils Relative % 88 %   Neutro Abs 21.3 (H) 1.7 - 7.7 K/uL   Lymphocytes Relative 6 %   Lymphs Abs 1.4 0.7 - 4.0 K/uL   Monocytes Relative 3 %   Monocytes Absolute 0.7 0.1 - 1.0 K/uL   Eosinophils Relative 1 %   Eosinophils Absolute 0.2 0.0 - 0.5 K/uL   Basophils Relative 0 %   Basophils Absolute 0.1 0.0 - 0.1 K/uL   Immature Granulocytes 2 %   Abs Immature Granulocytes 0.48 (H) 0.00 - 0.07 K/uL    Comment: Performed at [facility], [address], [place], [place] [address]  Magnesium     Status: None   Collection Time: 12/15/22  5:35 AM  Result Value Ref Range   Magnesium 1.7 1.7 - 2.4 mg/dL    Comment: Performed at [facility], [address], [place], [place] [address]  Phosphorus     Status: Abnormal   Collection Time: 12/15/22  5:35 AM  Result Value Ref Range  Phosphorus 8.2 (H) 2.5 - 4.6 mg/dL    Comment: Performed at [facility], [address] [place], [place] [address]  Transferrin     Status: Abnormal   Collection Time: 12/15/22  5:35 AM   Result Value Ref Range   Transferrin 97 (L) 192 - 382 mg/dL    Comment: Performed at [facility], [address], [place], [place] [address]  Iron and TIBC     Status: Abnormal   Collection Time: 12/15/22  5:35 AM  Result Value Ref Range   Iron 22 (L) 28 - 170 ug/dL   TIBC 829 (L) 562 - 130 ug/dL   Saturation Ratios 16 10.4 - 31.8 %   UIBC 119 ug/dL    Comment: Performed at [facility], [address], [place], [place] [address]  Ferritin     Status: Abnormal   Collection Time: 12/15/22  5:35 AM  Result Value Ref Range   Ferritin 1,919 (H) 11 - 307 ng/mL    Comment: Performed at [facility], [address], [place], [place] [address]  Technologist smear review     Status: None   Collection Time: 12/15/22  5:35 AM  Result Value Ref Range   WBC MORPHOLOGY SENT FOR PATH REVIEW    RBC MORPHOLOGY BURR CELLS     Comment: TEARDROP CELLS   Plt Morphology MORPHOLOGY UNREMARKABLE    Clinical Information      Hx chronic leukocytosis, eval for abnormal leukocytes    Comment: Performed at [facility], [address], [place], [place] [address]  CBG monitoring, ED     Status: None   Collection Time: 12/15/22  1:40 PM  Result Value Ref Range   Glucose-Capillary 95 70 - 99 mg/dL    Comment: Glucose reference range applies only to samples taken after fasting for at least 8 hours.  CBG monitoring, ED     Status: None   Collection Time: 12/15/22  4:33 PM  Result Value Ref Range   Glucose-Capillary 89 70 - 99 mg/dL    Comment: Glucose reference range applies only to samples taken after fasting for at least 8 hours.   CT ABDOMEN PELVIS WO CONTRAST  Result Date: 12/14/2022 CLINICAL DATA:  Acute, nonlocalized abdominal pain. Weakness with diarrhea for 4 days, nausea, and vomiting. EXAM: CT ABDOMEN AND PELVIS WITHOUT CONTRAST TECHNIQUE: Multidetector CT imaging of the abdomen and pelvis was performed following the standard protocol without IV contrast. RADIATION DOSE  REDUCTION: This exam was performed according to the departmental dose-optimization program which includes automated exposure control, adjustment of the mA and/or kV according to patient size and/or use of iterative reconstruction technique. COMPARISON:  11/25/2022 FINDINGS: Lower chest:  No contributory findings. Hepatobiliary: No focal liver abnormality.Cholecystectomy. No biliary dilatation Pancreas: Unremarkable. Spleen: Unremarkable. Adrenals/Urinary Tract: Negative adrenals. No hydronephrosis or stone. Symmetric perinephric stranding that is nonspecific. Unremarkable bladder. Stomach/Bowel:  No obstruction. No appendicitis. Vascular/Lymphatic: No acute vascular abnormality. Atheromatous calcification of the aorta and iliacs. No mass or adenopathy. Reproductive:No pathologic findings. Exophytic uterine fibroid towards the left at the fundus measuring nearly 5 cm Other: No ascites or pneumoperitoneum. Musculoskeletal: No acute abnormalities. Lumbar spine degeneration with mild L4-5 anterolisthesis. IMPRESSION: No acute finding or change since prior. No bowel obstruction or visible inflammation. Electronically Signed   By: Tiburcio Pea M.D.   On: 12/14/2022 19:18    Pending Labs [employer] (From admission, onward)     Start  Ordered   12/16/22 0500  Comprehensive metabolic panel  Tomorrow morning,   R       Question:  Specimen collection method  Answer:  IV Team=IV Team collect   12/15/22 0935   12/16/22 0500  CBC  Tomorrow morning,   R       Question:  Specimen collection method  Answer:  IV Team=IV Team collect   12/15/22 0935   12/16/22 0500  Magnesium  Tomorrow morning,   R       Question:  Specimen collection method  Answer:  IV Team=IV Team collect   12/15/22 0935   12/15/22 1623  C Difficile Quick Screen w PCR reflex  (C Difficile quick screen w PCR reflex panel )  Once, for 24 hours,   TIMED       References:    CDiff Information Tool   12/15/22 1622   12/15/22 1622   Gastrointestinal Panel by PCR , Stool  (Gastrointestinal Panel by PCR, Stool                                                                                                                                                     **Does Not include CLOSTRIDIUM DIFFICILE testing. **If CDIFF testing is needed, place order from the "C Difficile Testing" order set.**)  Once,   R       Question:  Release to patient  Answer:  Immediate   12/15/22 1622   12/15/22 0535  Pathologist smear review  Once,   R        12/15/22 0535   12/14/22 2033  Hepatitis B surface antibody,quantitative  (New Admission Hemo Labs (Hepatitis B))  Add-on,   AD       Question:  Specimen collection method  Answer:  IV Team=IV Team collect   12/14/22 2032   12/14/22 1600  Urinalysis, Routine w reflex microscopic -Urine, Clean Catch  Once,   URGENT       Question:  Specimen Source  Answer:  Urine, Clean Catch   12/14/22 1601            Vitals/Pain Today's Vitals   12/15/22 1515 12/15/22 1545 12/15/22 1620 12/15/22 1630  BP: (!) 142/92 99/87 132/80 (!) 141/90  Pulse: 95 97 92 92  Resp:   (!) 26 (!) 22  Temp:   98.2 F (36.8 C)   TempSrc:      SpO2: 99% 100% 100% 100%  Weight:      Height:      PainSc:        Isolation Precautions Enteric precautions (UV disinfection)  Medications Medications  acetaminophen (TYLENOL) tablet 1,000 mg (has no administration in time range)  ondansetron (ZOFRAN) injection 4 mg (has no administration in time range)  allopurinol (ZYLOPRIM) tablet 100 mg (100 mg Oral Not Given 12/15/22 1348)  HYDROmorphone (DILAUDID) tablet 2 mg ( Oral See Alternative 12/15/22 1143)    Or  HYDROmorphone (DILAUDID) tablet 4 mg (4 mg Oral Given 12/15/22 1143)  atorvastatin (LIPITOR) tablet 10 mg (10 mg Oral Given 12/15/22 0221)  midodrine (PROAMATINE) tablet 5 mg (5 mg Oral Given 12/15/22 1144)  busPIRone (BUSPAR) tablet 5 mg (5 mg Oral Not Given 12/15/22 1348)  DULoxetine (CYMBALTA) DR capsule 40 mg (40 mg  Oral Not Given 12/15/22 1349)  mirtazapine (REMERON) tablet 15 mg (15 mg Oral Given 12/15/22 0327)  insulin glargine-yfgn (SEMGLEE) injection 15 Units (15 Units Subcutaneous Given 12/15/22 0222)  ferric citrate (AURYXIA) tablet 420 mg (420 mg Oral Not Given 12/15/22 1351)  folic acid (FOLVITE) tablet 1 mg (1 mg Oral Not Given 12/15/22 1349)  albuterol (PROVENTIL) (2.5 MG/3ML) 0.083% nebulizer solution 2.5 mg (has no administration in time range)  insulin aspart (novoLOG) injection 0-6 Units ( Subcutaneous Not Given 12/15/22 1641)  insulin aspart (novoLOG) injection 2 Units (2 Units Subcutaneous Not Given 12/15/22 1352)  pantoprazole (PROTONIX) injection 40 mg (40 mg Intravenous Not Given 12/15/22 1350)  leptospermum manuka honey (MEDIHONEY) paste 1 Application (1 Application Topical Not Given 12/15/22 1350)  Gerhardt's butt cream ( Topical Not Given 12/15/22 1351)  cyanocobalamin (VITAMIN B12) injection 1,000 mcg (has no administration in time range)  ondansetron (ZOFRAN) injection 4 mg (4 mg Intravenous Given 12/14/22 1729)  HYDROmorphone (DILAUDID) injection 1 mg (1 mg Intravenous Given 12/14/22 1732)  sodium chloride 0.9 % bolus 1,000 mL (0 mLs Intravenous Stopped 12/15/22 0224)  barium (READI-CAT 2) 2 % suspension 450 mL (450 mLs Oral Given 12/14/22 1727)  0.9 %  sodium chloride infusion (Manually program via Guardrails IV Fluids) (0 mLs Intravenous Stopped 12/15/22 0225)  HYDROmorphone (DILAUDID) tablet 4 mg (4 mg Oral Given 12/15/22 0521)  hydrOXYzine (ATARAX) tablet 10 mg (10 mg Oral Given 12/15/22 1703)    Mobility manual wheelchair     Focused Assessments Cardiac Assessment Handoff:  Cardiac Rhythm: Normal sinus rhythm Lab Results  Component Value Date   CKTOTAL 140 05/11/2022   CKMB 3.0 03/24/2010   TROPONINI <0.03 07/17/2018   Lab Results  Component Value Date   DDIMER 0.47 08/03/2021   Does the Patient currently have chest pain? No    R Recommendations: See Admitting Provider  Note  Report given to:   Additional Notes:

## 2022-12-15 NOTE — Progress Notes (Addendum)
Received patient in bed to unit.  Alert and oriented.  Informed consent signed and in chart.   TX duration:3.08.[name] Terminated session early due to patient request, Dr. Arlean Hopping alerted, AMA paperwork signed by patient  Patient tolerated well.  Transported back to the room  Alert, without acute distress.  Hand-off given to patient's nurse.   Access used: R HD Cath Access issues: none  Total UF removed: Medication(s) given: Dilaudid, see MAR   12/15/22 1208  Vitals  Temp (!) 97.5 F (36.4 C)  Temp Source Oral  BP 112/80  MAP (mmHg) 91  Pulse Rate 93  Resp (!) 21  Oxygen Therapy  SpO2 100 %  O2 Device Room Air  During Treatment Monitoring  HD Safety Checks Performed Yes  Intra-Hemodialysis Comments  (Patient Terminated treament, Dr. Arlean Hopping alerted, AMA paper work signed.)  Dialysis Fluid Bolus Normal Saline  Bolus Amount (mL) 300 mL     Stacie Glaze LPN [facility]

## 2022-12-15 NOTE — Progress Notes (Signed)
PROGRESS NOTE  Deborah [name] NGE:[id number] DOB: 08-31-1965 DOA: 12/14/2022 PCP: Pcp, No   LOS: 1 day   Brief Narrative / Interim history: 57 y.o. female with hx of ESRD on M/W/F HD, PAF on Eliquis, HFpEF, HTN, DM-2, malignant carcinoid, NAFLD, morbid obesity, chronic ulceration R ankle, recent admission 9/16 -24 initially suspected sepsis from SSTI but ultimately sepsis ruled out, and antibiotics were stopped. That admission underwent angiogram which showed adequate perfusion of the R foot. Treated for gout involving R ankle. Returns due to initial complaint of abd pain, per ED note 1 day history persistent abd pain. Had also missed last HD session, last session on 10/4. Found to have anemia, with Hb of 5.8. On further history, per RN report patients mother provided collateral of 1 week of bright red blood per rectum   Subjective / 24h Interval events: Somnolent this morning, wakes up, reports that she has seen some blood in her stools.  Cannot quantify how much or even how often  Assesement and Plan: Principal problem Acute on chronic anemia, concern for acute blood loss anemia due to lower GI bleed -gastroenterology consulted, appreciate input.  Transfuse as needed to keep hemoglobin above 7. -Continue PPI, n.p.o.  Active problems ESRD -nephrology consulted, appreciate input.  Getting dialysis today  A-fib-hold home apixaban in setting of bleed, not on rate controlling agents  Acute toxic encephalopathy, possibly due to opiate medication -monitor, reduce home Dilaudid, hold gabapentin  Chronic leukocytosis-recently on steroids for gout.  Metastatic duodenal carcinoid-had resection  Chronic diastolic CHF-euvolemic, volume management per HD  Chronic pain-continue home Dilaudid at a lower dose  Hyperlipidemia, NAFLD-continue statin  Mood disorder-continue duloxetine, added diazepam  Gout-continue home allopurinol  Chronic wounds-wound care consult.  Right foot MRI done  just 2 weeks ago without evidence of osteomyelitis  Scheduled Meds:  allopurinol  100 mg Oral BID   atorvastatin  10 mg Oral QHS   busPIRone  5 mg Oral BID   DULoxetine  40 mg Oral Daily   ferric citrate  420 mg Oral TID WC   folic acid  1 mg Oral Daily   Gerhardt's butt cream   Topical BID   heparin sodium (porcine)  3,800 Units Intracatheter Once   insulin aspart  0-6 Units Subcutaneous TID WC   insulin aspart  2 Units Subcutaneous TID WC   insulin glargine-yfgn  15 Units Subcutaneous QHS   leptospermum manuka honey  1 Application Topical Daily   midodrine  5 mg Oral TID WC   mirtazapine  15 mg Oral QHS   pantoprazole (PROTONIX) IV  40 mg Intravenous Q12H   Continuous Infusions:  anticoagulant sodium citrate     PRN Meds:.acetaminophen, albuterol, alteplase, anticoagulant sodium citrate, heparin, HYDROmorphone **OR** HYDROmorphone, lidocaine (PF), lidocaine-prilocaine, ondansetron (ZOFRAN) IV, pentafluoroprop-tetrafluoroeth  Current Outpatient Medications  Medication Instructions   albuterol (PROVENTIL) 2.5 mg, Nebulization, Every 6 hours PRN   allopurinol (ZYLOPRIM) 100 mg, Oral, 2 times daily   apixaban (ELIQUIS) 5 mg, Oral, 2 times daily   atorvastatin (LIPITOR) 10 mg, Oral, Daily at bedtime   busPIRone (BUSPAR) 5 mg, Oral, 2 times daily   cetirizine (ZYRTEC) 10 MG tablet Oral   colchicine 0.3 mg, Oral, 2 times weekly   doxercalciferol (HECTOROL) 4 mcg, Intravenous, Every M-W-F (Hemodialysis)   DULoxetine HCl 40 MG CPEP Oral   EASY COMFORT PEN NEEDLES 31G X 5 MM MISC USE 3 TIMES A DAY FOR INSULIN ADMINISTRATION   ferric citrate (AURYXIA) 420 mg, Oral, 3  times daily with meals   fluticasone (FLONASE) 50 MCG/ACT nasal spray 2 sprays, Each Nare, Daily PRN   folic acid (FOLVITE) 1 mg, Oral, Daily   gabapentin (NEURONTIN) 100 mg, Oral, 3 times daily   HYDROmorphone (DILAUDID) 4 mg, Oral, 3 times daily PRN, Three times a day as needed 20 days out of the month.   insulin aspart  (NOVOLOG) 5 Units, Subcutaneous, 3 times daily with meals   insulin glargine-yfgn (SEMGLEE) 15 Units, Subcutaneous, 2 times daily   insulin lispro (HUMALOG) 100 UNIT/ML KwikPen Before each meal 3 times a day, 140-199 - 2 units, 200-250 - 6 units, 251-299 - 8 units,  300-349 - 12 units,  350 or above 14 units.   loperamide (IMODIUM A-D) 2 mg, Oral, Every 6 hours PRN   melatonin 3 MG TABS tablet Oral   midodrine (PROAMATINE) 5 mg, Oral, 3 times daily with meals   mirtazapine (REMERON) 15 mg, Oral, Daily at bedtime   Multiple Vitamin (MULTIVITAMIN WITH MINERALS) TABS tablet 1 tablet, Oral, Daily   omeprazole (PRILOSEC) 20 MG capsule Oral   ondansetron (ZOFRAN ODT) 4 mg, Oral, Every 8 hours PRN   Vitamin D (Ergocalciferol) (DRISDOL) 50,000 Units, Oral, Weekly    Diet Orders (From admission, onward)     Start     Ordered   12/14/22 2037  Diet renal with fluid restriction Fluid restriction: 1200 mL Fluid; Room service appropriate? Yes; Fluid consistency: Thin  Diet effective now       Question Answer Comment  Fluid restriction: 1200 mL Fluid   Room service appropriate? Yes   Fluid consistency: Thin      12/14/22 2039            DVT prophylaxis: SCDs Start: 12/14/22 2037   Lab Results  Component Value Date   PLT 319 12/15/2022      Code Status: Full Code  Family Communication: no family at bedside   Status is: Inpatient Remains inpatient appropriate because: confusion  Level of care: Telemetry Medical  Consultants:  Nephrology GI  Objective: Vitals:   12/15/22 0530 12/15/22 0822 12/15/22 0843 12/15/22 0900  BP: 132/70 (!) 141/84 130/77 (!) 139/90  Pulse: 85 90 88 86  Resp: (!) 23 (!) 21 19 18   Temp:  97.8 F (36.6 C)    TempSrc:  Oral    SpO2: 98% 99% 99% 100%  Weight:      Height:        Intake/Output Summary (Last 24 hours) at 12/15/2022 0929 Last data filed at 12/15/2022 0225 Gross per 24 hour  Intake 451.25 ml  Output --  Net 451.25 ml   Wt Readings  from Last 3 Encounters:  12/14/22 127.5 kg  11/28/22 127.5 kg  10/19/22 132.9 kg    Examination:  Constitutional: NAD Eyes: no scleral icterus ENMT: Mucous membranes are moist.  Neck: normal, supple Respiratory: clear to auscultation bilaterally, no wheezing, no crackles.  Cardiovascular: Regular rate and rhythm, no murmurs / rubs / gallops. No LE edema.  Abdomen: non distended, no tenderness. Bowel sounds positive.  Musculoskeletal: no clubbing / cyanosis.    Data Reviewed: I have independently reviewed following labs and imaging studies   CBC Recent Labs  Lab 12/14/22 1700 12/15/22 0535  WBC 21.0* 24.1*  HGB 5.8* 7.0*  HCT 19.5* 23.2*  PLT 285 319  MCV 92.9 93.5  MCH 27.6 28.2  MCHC 29.7* 30.2  RDW 18.3* 17.7*  LYMPHSABS 1.0 1.4  MONOABS 0.6 0.7  EOSABS 0.1  0.2  BASOSABS 0.1 0.1    Recent Labs  Lab 12/14/22 1700 12/15/22 0157 12/15/22 0535  NA 132*  --  136  K 4.0  --  4.1  CL 105  --  106  CO2 15*  --  16*  GLUCOSE 284*  --  160*  BUN 72*  --  76*  CREATININE 6.87*  --  6.74*  CALCIUM 7.0*  --  7.5*  AST 14*  --   --   ALT 14  --   --   ALKPHOS 81  --   --   BILITOT 0.3  --   --   ALBUMIN 1.5*  --   --   MG  --   --  1.7  INR  --  1.9*  --   BNP 368.9*  --   --     ------------------------------------------------------------------------------------------------------------------ No results for input(s): "CHOL", "HDL", "LDLCALC", "TRIG", "CHOLHDL", "LDLDIRECT" in the last 72 hours.  Lab Results  Component Value Date   HGBA1C 8.1 (H) 06/19/2022   ------------------------------------------------------------------------------------------------------------------ No results for input(s): "TSH", "T4TOTAL", "T3FREE", "THYROIDAB" in the last 72 hours.  Invalid input(s): "FREET3"  Cardiac Enzymes No results for input(s): "CKMB", "TROPONINI", "MYOGLOBIN" in the last 168 hours.  Invalid input(s):  "CK" ------------------------------------------------------------------------------------------------------------------    Component Value Date/Time   BNP 368.9 (H) 12/14/2022 1700    CBG: No results for input(s): "GLUCAP" in the last 168 hours.  No results found for this or any previous visit (from the past 240 hour(s)).   Radiology Studies: CT ABDOMEN PELVIS WO CONTRAST  Result Date: 12/14/2022 CLINICAL DATA:  Acute, nonlocalized abdominal pain. Weakness with diarrhea for 4 days, nausea, and vomiting. EXAM: CT ABDOMEN AND PELVIS WITHOUT CONTRAST TECHNIQUE: Multidetector CT imaging of the abdomen and pelvis was performed following the standard protocol without IV contrast. RADIATION DOSE REDUCTION: This exam was performed according to the departmental dose-optimization program which includes automated exposure control, adjustment of the mA and/or kV according to patient size and/or use of iterative reconstruction technique. COMPARISON:  11/25/2022 FINDINGS: Lower chest:  No contributory findings. Hepatobiliary: No focal liver abnormality.Cholecystectomy. No biliary dilatation Pancreas: Unremarkable. Spleen: Unremarkable. Adrenals/Urinary Tract: Negative adrenals. No hydronephrosis or stone. Symmetric perinephric stranding that is nonspecific. Unremarkable bladder. Stomach/Bowel:  No obstruction. No appendicitis. Vascular/Lymphatic: No acute vascular abnormality. Atheromatous calcification of the aorta and iliacs. No mass or adenopathy. Reproductive:No pathologic findings. Exophytic uterine fibroid towards the left at the fundus measuring nearly 5 cm Other: No ascites or pneumoperitoneum. Musculoskeletal: No acute abnormalities. Lumbar spine degeneration with mild L4-5 anterolisthesis. IMPRESSION: No acute finding or change since prior. No bowel obstruction or visible inflammation. Electronically Signed   By: Tiburcio Pea M.D.   On: 12/14/2022 19:18     Pamella Pert, MD, PhD [facility]  Between 7 am - 7 pm I am available, please contact me via Amion (for emergencies) or Securechat (non urgent messages)  Between 7 pm - 7 am I am not available, please contact night coverage MD/APP via Amion

## 2022-12-15 NOTE — ED Notes (Signed)
Need orders for sedation  for mri  doctor has just returned my call and he is going to place an order for her  mri notified  for the delay

## 2022-12-15 NOTE — ED Notes (Signed)
Messaged Segars, Christiane Ha MD about discontinuing 2nd blood tranfusion and doing it today per his request

## 2022-12-15 NOTE — ED Notes (Signed)
Pt refused a in and out urinary due to her being in pain.She wants to wait for her pain medication.

## 2022-12-15 NOTE — ED Notes (Signed)
Assumed care of patient at this time. Patient found naked, refusing to wear hospital gown, yelling out for pain medication. Reviewed patients chart and requested ordered medication to be reviewed by pharmacy and sent to ER ASAP. Patient informed of delay.

## 2022-12-15 NOTE — ED Notes (Signed)
Gherghe will call Thayer Ohm C back

## 2022-12-15 NOTE — ED Notes (Signed)
To mri 

## 2022-12-15 NOTE — Consult Note (Addendum)
Consultation  Referring Provider: [facility]/ Segars Primary Care Physician:  Pcp, No Primary Gastroenterologist:  [facility] ?  Reason for Consultation: Abdominal pain,, possible lower GI bleeding, reported hematochezia in setting of apixaban, and diarrhea  HPI: Deborah [name] is a 57 y.o. female with multiple significant comorbidities, including chronic narcotic dependence, on chronic Dilaudid at home, end-stage renal disease, on dialysis history of atrial fibrillation for which she has been on apixaban, secondary hyperparathyroidism, diabetes mellitus, nonischemic cardiomyopathy, history of CVA, morbid obesity. Patient also per chart with history of NAFLD and possible cirrhosis. Presented to the emergency room today with complaints of acute on chronic abdominal pain, worsening.  Also complaining of anorexia and diarrhea which has been present over the past 3 to 4 weeks.  She apparently missed dialysis about 5 days ago.  Lab workup on arrival with WBC of 21,000, hemoglobin 5.8/hematocrit 19.5 BUN 17/creatinine 6.87 Lipase 61.  CT of the abdomen pelvis was done last evening without IV contrast showed no acute findings or changes, no bowel obstruction or visible inflammation.  On further questioning today patient says that she had run out of Dilaudid admitted that she was taking more than she was supposed to due to chronic allover body pain.  Her mother relates that she has been having loose stools since she was in the hospital about a month ago and unable to tell me exactly how many times per day but patient says that she has not been seeing any black stool or blood in her stool.  She says when she was in the hospital in September she was seeing dark stools for some days during that time. She complains of rather generalized abdominal pain.  Also concerned about her skin wounds, she has a large eschar on her left heel with exudate, also has pressure damage to her buttocks bilaterally  on exam and her upper thighs and has a couple of areas of small open wounds. Patient has had GI workup in the past.  She last had EGD here in May 2022 due to complaints of coffee-ground emesis this was done per Dr. Dulce Sellar, had grade a esophagitis, very little view of the stomach due to retained food. She had been known previously to Dr. 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 Patient was unhappy with Dr. Rich Brave care and says she sought a second opinion in [place] and had seen a gastroenterologist up there who did an endoscopic resection.  She has had repeat endoscopy since that time and says she supposed to follow-up there later this year. She believes that she had a colonoscopy within the past few months but cannot tell me who did it or at what facility believes that it was done in [place]. I called and she has not been seen at [facility] since 2021.  She was transfused 1 unit of packed RBCs, today WBC 24.1/hemoglobin 7 hematocrit 23.2/MCV 93 Ferritin 1919/serum iron 22/TIBC 140/sat 16 INR 1.9   Past Medical History:  Diagnosis Date   Acute back pain with sciatica, left    Acute back pain with sciatica, right    AKI (acute kidney injury) (HCC)    Anemia, unspecified    Cancer (HCC)    Carcinoid tumor of duodenum    Chest pain with normal coronary angiography 2019   Chronic kidney disease, stage 3b (HCC)    Chronic pain  Chronic systolic CHF (congestive heart failure) (HCC)    Diabetes mellitus    DKA (diabetic ketoacidosis) (HCC)    Drug-seeking behavior    21 hospitalizations and 14 CT a/p in 2 years for N/V and abdominal pain, demanding only IV dilaudid   Elevated troponin    chronic   Esophageal reflux    Fibromyalgia    Gastric ulcer    Gastroparesis    Gout    Hyperlipidemia    Hypertension    Hypokalemia    Hypomagnesemia    Lumbosacral stenosis     LVH (left ventricular hypertrophy)    Morbid obesity (HCC)    NICM (nonischemic cardiomyopathy) (HCC)    PAF (paroxysmal atrial fibrillation) (HCC)    Stroke (HCC) 02/2011   Thrombocytosis    Vitamin B12 deficiency anemia     Past Surgical History:  Procedure Laterality Date   ABDOMINAL AORTOGRAM W/LOWER EXTREMITY N/A 11/29/2022   Procedure: ABDOMINAL AORTOGRAM W/LOWER EXTREMITY;  Surgeon: Daria Pastures, MD;  Location: [facility];  Service: Cardiovascular;  Laterality: N/A;   AV FISTULA PLACEMENT Left 06/30/2022   Procedure: LEFT BRACHIOCEPHALIC ARTERIOVENOUS (AV) FISTULA CREATION;  Surgeon: Cephus Shelling, MD;  Location: [facility];  Service: Vascular;  Laterality: Left;   BIOPSY  07/27/2019   Procedure: BIOPSY;  Surgeon: Vida Rigger, MD;  Location: [facility]OSCOPY;  Service: Endoscopy;;   BIOPSY  07/30/2019   Procedure: BIOPSY;  Surgeon: Kathi Der, MD;  Location: [facility]OS[facility];  Service: Gastroenterology;;   CATARACT EXTRACTION  01/2014   CHOLECYSTECTOMY     COLONOSCOPY WITH PROPOFOL N/A 07/30/2019   Procedure: COLONOSCOPY WITH PROPOFOL;  Surgeon: Kathi Der, MD;  Location: [facility];  Service: Gastroenterology;  Laterality: N/A;   ESOPHAGOGASTRODUODENOSCOPY N/A 07/27/2019   Procedure: ESOPHAGOGASTRODUODENOSCOPY (EGD);  Surgeon: Vida Rigger, MD;  Location: [facility];  Service: Endoscopy;  Laterality: N/A;   ESOPHAGOGASTRODUODENOSCOPY N/A 07/26/2020   Procedure: ESOPHAGOGASTRODUODENOSCOPY (EGD);  Surgeon: Willis Modena, MD;  Location: [facility];  Service: Endoscopy;  Laterality: N/A;   ESOPHAGOGASTRODUODENOSCOPY (EGD) WITH PROPOFOL N/A 08/02/2019   Procedure: ESOPHAGOGASTRODUODENOSCOPY (EGD) WITH PROPOFOL;  Surgeon: Kathi Der, MD;  Location: [facility];  Service: Gastroenterology;  Laterality: N/A;   HEMOSTASIS CLIP PLACEMENT  08/02/2019   Procedure: HEMOSTASIS CLIP PLACEMENT;  Surgeon: Kathi Der, MD;  Location: [facility];  Service:  Gastroenterology;;   IR FLUORO GUIDE CV LINE RIGHT  06/24/2022   IR US GUIDE VASC ACCESS RIGHT  06/24/2022   POLYPECTOMY  07/30/2019   Procedure: POLYPECTOMY;  Surgeon: Kathi Der, MD;  Location: [facility];  Service: Gastroenterology;;   POLYPECTOMY  08/02/2019   Procedure: POLYPECTOMY;  Surgeon: Kathi Der, MD;  Location: [facility];  Service: Gastroenterology;;    Prior to Admission medications   Medication Sig Start Date End Date Taking? Authorizing Provider  albuterol (PROVENTIL) (2.5 MG/3ML) 0.083% nebulizer solution Take 3 mLs (2.5 mg total) by nebulization every 6 (six) hours as needed for wheezing or shortness of breath. 04/06/19   Bing Neighbors, NP  allopurinol (ZYLOPRIM) 100 MG tablet Take 1 tablet (100 mg total) by mouth 2 (two) times daily. 11/30/22   Azucena Fallen, MD  apixaban (ELIQUIS) 5 MG TABS tablet Take 5 mg by mouth 2 (two) times daily. 12/03/21   [provider]  atorvastatin (LIPITOR) 10 MG tablet Take 10 mg by mouth at bedtime. 12/03/21   [provider]  busPIRone (BUSPAR) 5 MG tablet Take 5 mg by mouth 2 (two) times daily.  [provider]  cetirizine (ZYRTEC) 10 MG tablet Take by mouth. 08/05/22   [provider]  colchicine 0.6 MG tablet Take 0.5 tablets (0.3 mg total) by mouth 2 (two) times a week. 12/02/22   Azucena Fallen, MD  doxercalciferol (HECTOROL) 4 MCG/2ML injection Inject 2 mLs (4 mcg total) into the vein every Monday, Wednesday, and Friday with hemodialysis. 11/30/22   Azucena Fallen, MD  DULoxetine HCl 40 MG CPEP Take by mouth. 08/05/22   [provider]  EASY COMFORT PEN NEEDLES 31G X 5 MM MISC USE 3 TIMES A DAY FOR INSULIN ADMINISTRATION 11/14/19   Meccariello, Solmon Ice, MD  ferric citrate (AURYXIA) 1 GM 210 MG(Fe) tablet Take 2 tablets (420 mg total) by mouth 3 (three) times daily with meals. 11/30/22   Azucena Fallen, MD  fluticasone [facility]) 50 MCG/ACT nasal spray Place  2 sprays into both nostrils daily as needed for allergies or rhinitis. Patient taking differently: Place 1 spray into both nostrils daily as needed for allergies. 12/19/18   Rai, Delene Ruffini, MD  folic acid (FOLVITE) 1 MG tablet Take 1 mg by mouth daily. 04/07/21   [provider]  gabapentin (NEURONTIN) 100 MG capsule Take 1 capsule (100 mg total) by mouth 3 (three) times daily. 11/30/22   Azucena Fallen, MD  HYDROmorphone (DILAUDID) 4 MG tablet Take 1 tablet (4 mg total) by mouth 3 (three) times daily as needed for moderate pain. Three times a day as needed 20 days out of the month. Patient taking differently: Take 4 mg by mouth 2 (two) times daily. 11/18/22   Jones Bales, NP  insulin aspart (NOVOLOG) 100 UNIT/ML injection Inject 5 Units into the skin 3 (three) times daily with meals. 06/04/22   Leroy Sea, MD  insulin glargine-yfgn (SEMGLEE) 100 UNIT/ML injection Inject 0.15 mLs (15 Units total) into the skin 2 (two) times daily. 07/05/22   Rhetta Mura, MD  insulin lispro (HUMALOG) 100 UNIT/ML KwikPen Before each meal 3 times a day, 140-199 - 2 units, 200-250 - 6 units, 251-299 - 8 units,  300-349 - 12 units,  350 or above 14 units. Patient taking differently: Inject 2-14 Units into the skin See admin instructions. Before each meal and at bedtime. 140-199 - 2 units, 200-250 - 6 units, 251-299 - 8 units,  300-349 - 12 units,  350 or above 14 units. 06/04/22   Leroy Sea, MD  loperamide (IMODIUM A-D) 2 MG tablet Take 2 mg by mouth every 6 (six) hours as needed for diarrhea or loose stools.    [provider]  melatonin 3 MG TABS tablet Take by mouth. 10/21/20   [provider]  midodrine (PROAMATINE) 5 MG tablet Take 1 tablet (5 mg total) by mouth 3 (three) times daily with meals. 11/30/22   Azucena Fallen, MD  mirtazapine (REMERON) 15 MG tablet Take 15 mg by mouth at bedtime. 10/18/22   [provider]  Multiple Vitamin (MULTIVITAMIN  WITH MINERALS) TABS tablet Take 1 tablet by mouth daily. 05/28/22   Kathlen Mody, MD  omeprazole (PRILOSEC) 20 MG capsule Take by mouth. 08/05/22   [provider]  ondansetron (ZOFRAN ODT) 4 MG disintegrating tablet Take 1 tablet (4 mg total) by mouth every 8 (eight) hours as needed for nausea or vomiting. 05/30/21   [name], MD  Vitamin D, Ergocalciferol, (DRISDOL) 1.25 MG (50000 UNIT) CAPS capsule Take 50,000 Units by mouth once a week. 04/07/21   [provider]    Current Facility-Administered Medications  Medication Dose Route Frequency Provider Last Rate Last Admin   acetaminophen (TYLENOL) tablet 1,000 mg  1,000 mg Oral Q6H PRN Segars, Christiane Ha, MD       albuterol (PROVENTIL) (2.5 MG/3ML) 0.083% nebulizer solution 2.5 mg  2.5 mg Nebulization Q6H PRN Segars, Christiane Ha, MD       allopurinol (ZYLOPRIM) tablet 100 mg  100 mg Oral BID Dolly Rias, MD   100 mg at 12/15/22 1610   atorvastatin (LIPITOR) tablet 10 mg  10 mg Oral QHS Dolly Rias, MD   10 mg at 12/15/22 0221   busPIRone (BUSPAR) tablet 5 mg  5 mg Oral BID Dolly Rias, MD   5 mg at 12/15/22 9604   cyanocobalamin (VITAMIN B12) injection 1,000 mcg  1,000 mcg Intramuscular Once Leatha Gilding, MD       DULoxetine (CYMBALTA) DR capsule 40 mg  40 mg Oral Daily Segars, Christiane Ha, MD       ferric citrate (AURYXIA) tablet 420 mg  420 mg Oral TID WC Segars, Christiane Ha, MD       folic acid (FOLVITE) tablet 1 mg  1 mg Oral Daily Segars, Christiane Ha, MD       Gerhardt's butt cream   Topical BID Leatha Gilding, MD       HYDROmorphone (DILAUDID) tablet 2 mg  2 mg Oral Q8H PRN Dolly Rias, MD       Or   HYDROmorphone (DILAUDID) tablet 4 mg  4 mg Oral Q8H PRN Dolly Rias, MD   4 mg at 12/15/22 1143   insulin aspart (novoLOG) injection 0-6 Units  0-6 Units Subcutaneous TID WC Segars, Christiane Ha, MD       insulin aspart (novoLOG) injection 2 Units  2 Units Subcutaneous TID WC Segars, Christiane Ha, MD        insulin glargine-yfgn (SEMGLEE) injection 15 Units  15 Units Subcutaneous QHS Dolly Rias, MD   15 Units at 12/15/22 0222   leptospermum manuka honey (MEDIHONEY) paste 1 Application  1 Application Topical Daily Leatha Gilding, MD       midodrine (PROAMATINE) tablet 5 mg  5 mg Oral TID WC Dolly Rias, MD   5 mg at 12/15/22 1144   mirtazapine (REMERON) tablet 15 mg  15 mg Oral QHS Dolly Rias, MD   15 mg at 12/15/22 0327   ondansetron (ZOFRAN) injection 4 mg  4 mg Intravenous Q6H PRN Dolly Rias, MD       pantoprazole (PROTONIX) injection 40 mg  40 mg Intravenous Domenica Fail, MD   40 mg at 12/15/22 0218   Current Outpatient Medications  Medication Sig Dispense Refill   albuterol (PROVENTIL) (2.5 MG/3ML) 0.083% nebulizer solution Take 3 mLs (2.5 mg total) by nebulization every 6 (six) hours as needed for wheezing or shortness of breath. 150 mL 1   allopurinol (ZYLOPRIM) 100 MG tablet Take 1 tablet (100 mg total) by mouth 2 (two) times daily. 60 tablet 0   apixaban (ELIQUIS) 5 MG TABS tablet Take 5 mg by mouth 2 (two) times daily.     atorvastatin (LIPITOR) 10 MG tablet Take 10 mg by mouth at bedtime.     busPIRone (BUSPAR) 5 MG tablet Take 5 mg by mouth 2 (two) times daily.     cetirizine (ZYRTEC) 10 MG tablet Take by mouth.     colchicine 0.6 MG tablet Take 0.5 tablets (0.3 mg total) by mouth 2 (two) times a week. 4 tablet 2  doxercalciferol (HECTOROL) 4 MCG/2ML injection Inject 2 mLs (4 mcg total) into the vein every Monday, Wednesday, and Friday with hemodialysis. 2 mL 0   DULoxetine HCl 40 MG CPEP Take by mouth.     EASY COMFORT PEN NEEDLES 31G X 5 MM MISC USE 3 TIMES A DAY FOR INSULIN ADMINISTRATION 100 each 3   ferric citrate (AURYXIA) 1 GM 210 MG(Fe) tablet Take 2 tablets (420 mg total) by mouth 3 (three) times daily with meals. 270 tablet 1   fluticasone (FLONASE) 50 MCG/ACT nasal spray Place 2 sprays into both nostrils daily as needed for allergies or  rhinitis. (Patient taking differently: Place 1 spray into both nostrils daily as needed for allergies.) 16 g 2   folic acid (FOLVITE) 1 MG tablet Take 1 mg by mouth daily.     gabapentin (NEURONTIN) 100 MG capsule Take 1 capsule (100 mg total) by mouth 3 (three) times daily. 90 capsule 0   HYDROmorphone (DILAUDID) 4 MG tablet Take 1 tablet (4 mg total) by mouth 3 (three) times daily as needed for moderate pain. Three times a day as needed 20 days out of the month. (Patient taking differently: Take 4 mg by mouth 2 (two) times daily.) 80 tablet 0   insulin aspart (NOVOLOG) 100 UNIT/ML injection Inject 5 Units into the skin 3 (three) times daily with meals. 10 mL 0   insulin glargine-yfgn (SEMGLEE) 100 UNIT/ML injection Inject 0.15 mLs (15 Units total) into the skin 2 (two) times daily. 10 mL 0   insulin lispro (HUMALOG) 100 UNIT/ML KwikPen Before each meal 3 times a day, 140-199 - 2 units, 200-250 - 6 units, 251-299 - 8 units,  300-349 - 12 units,  350 or above 14 units. (Patient taking differently: Inject 2-14 Units into the skin See admin instructions. Before each meal and at bedtime. 140-199 - 2 units, 200-250 - 6 units, 251-299 - 8 units,  300-349 - 12 units,  350 or above 14 units.) 15 mL 0   loperamide (IMODIUM A-D) 2 MG tablet Take 2 mg by mouth every 6 (six) hours as needed for diarrhea or loose stools.     melatonin 3 MG TABS tablet Take by mouth.     midodrine (PROAMATINE) 5 MG tablet Take 1 tablet (5 mg total) by mouth 3 (three) times daily with meals. 90 tablet 2   mirtazapine (REMERON) 15 MG tablet Take 15 mg by mouth at bedtime.     Multiple Vitamin (MULTIVITAMIN WITH MINERALS) TABS tablet Take 1 tablet by mouth daily.     omeprazole (PRILOSEC) 20 MG capsule Take by mouth.     ondansetron (ZOFRAN ODT) 4 MG disintegrating tablet Take 1 tablet (4 mg total) by mouth every 8 (eight) hours as needed for nausea or vomiting. 20 tablet 0   Vitamin D, Ergocalciferol, (DRISDOL) 1.25 MG (50000 UNIT)  CAPS capsule Take 50,000 Units by mouth once a week.      Allergies as of 12/14/2022 - Review Complete 12/14/2022  Allergen Reaction Noted   Diazepam [name] 09/21/2011   Gabapentin [name] and Swelling 03/27/2017   Iodinated contrast media Anaphylaxis and [name] 11/28/2017   Iopamidol Anaphylaxis 11/28/2017   Lisinopril Anaphylaxis 09/21/2011   Metoclopramide Other (See Comments) 08/08/2019   Nsaids Anaphylaxis and Other (See Comments) 09/21/2015   Penicillins Palpitations and Itching 02/04/2011   Chlorhexidine Dermatitis 09/10/2022   Tolmetin Nausea Only, Other (See Comments), and Nausea And Vomiting 09/21/2015   Aspartame and phenylalanine  Hives 09/10/2022   Aspirin Other (See Comments) 09/26/2015   Dicyclomine Other (See Comments) 04/15/2022   Rifamycin  09/13/2022   Acetaminophen Nausea Only, Other (See Comments), and Nausea And Vomiting 08/28/2015   Cyclobenzaprine Palpitations 05/17/2020   Oxycodone Palpitations 10/09/2020   Rifamycins Palpitations 07/25/2020   Tramadol Nausea And Vomiting 12/22/2015    Family History  Problem Relation Age of Onset   Diabetes Mother    Diabetes Father    Heart disease Father    Diabetes Sister    Congestive Heart Failure Sister 30   Diabetes Brother     Social History   Socioeconomic History   Marital status: Married    Spouse name: Not on file   Number of children: Not on file   Years of education: Not on file   Highest education level: Not on file  Occupational History   Not on file  Tobacco Use   Smoking status: Never   Smokeless tobacco: Never  Vaping Use   Vaping status: Never Used  Substance and Sexual Activity   Alcohol use: No   Drug use: No   Sexual activity: Not Currently    Birth control/protection: None  Other Topics Concern   Not on file  Social History Narrative   ** Merged History Encounter **       Social Determinants of Health   Financial Resource Strain: Low  Risk (03/23/2021)   Received from [facility])   Financial Resource Strain  Food Insecurity: No Food Insecurity (11/23/2022)   Hunger Vital Sign    Worried About Running Out of Food in the Last Year: Never true    Ran Out of Food in the Last Year: Never true  Transportation Needs: No Transportation Needs (11/23/2022)   PRAPARE - Administrator, Civil Service (Medical): No    Lack of Transportation (Non-Medical): No  Physical Activity: Not on File (10/31/2017)   Received from [place], [place]   Physical Activity    Physical Activity: 0  Stress: Low Risk (03/23/2021)   Received [facility])   Stress    Over the last 2 weeks, how often have you been bothered by the following problems: feeling nervous, anxious, on edge?: Not at all    Over the last 2 weeks, how often have you been bothered by the following problems: Not being able to stop or control worrying?: Not at all  Social Connections: Unknown (07/19/2021)   Received from [facility]   Social Network    Social Network: Not on file  Intimate Partner Violence: Not At Risk (11/23/2022)   Humiliation, Afraid, Rape, and Kick questionnaire    Fear of Current or Ex-Partner: No    Emotionally Abused: No    Physically Abused: No    Sexually Abused: No    Review of Systems: Pertinent positive and negative review of systems were noted in the above HPI section.  All other review of systems was otherwise negative.   Physical Exam: Vital signs in last 24 hours: Temp:  [97.5 F (36.4 C)-98.2 F (36.8 C)] 98.2 F (36.8 C) (10/09 1620) Pulse Rate:  [81-96] 92 (10/09 1620) Resp:  [15-26] 26 (10/09 1620) BP: (97-141)/(48-97) 132/80 (10/09 1620) SpO2:  [77 %-100 %] 100 % (10/09 1620)   General:   Alert,  Well-developed, chronically ill-appearing morbidly obese African-American female , cooperative in NAD-family at bedside Head:  Normocephalic and  atraumatic. Eyes:  Sclera clear,  no icterus.   Conjunctiva pink. Ears:  Normal auditory acuity. Nose:  No deformity, discharge,  or lesions. Mouth:  No deformity or lesions.   Neck:  Supple; no masses or thyromegaly. Lungs:  Clear throughout to auscultation.   No wheezes, crackles, or rhonchi  Heart:  Regular rate and rhythm; no murmurs, clicks, rubs,  or gallops. Abdomen: Obese, soft, rather diffusely tender no guarding or rebound no focal tenderness bowel sounds are present no palpable mass or hepatosplenomegaly Rectal: Not done-I did examine her buttocks and thighs, at the request of family, she has extensive chronic skin changes from immobility/pressure injury and a couple of small areas of open wounds 1 which is oozing some blood located between her thighs just below the buttocks Msk:  Symmetrical without gross deformities. . Pulses:  Normal pulses noted. Extremities: Stasis changes bilaterally. Neurologic:  Alert and  oriented x4;  grossly normal neurologically. Skin: Left heel with large eschar, weeping and odorous, right foot equally painful, pressure changes of the right heel and plantar surface, no definite eschar. Psych:  Alert and cooperative. Normal mood and affect.  Intake/Output from previous day: 10/08 0701 - 10/09 0700 In: 451.3 [I.V.:15; Blood:336.3; IV Piggyback:100] Out: -  Intake/Output this shift: Total I/O In: -  Out: 2800 [Other:2800]  Lab Results: Recent Labs    12/14/22 1700 12/15/22 0535  WBC 21.0* 24.1*  HGB 5.8* 7.0*  HCT 19.5* 23.2*  PLT 285 319   BMET Recent Labs    12/14/22 1700 12/15/22 0535  NA 132* 136  K 4.0 4.1  CL 105 106  CO2 15* 16*  GLUCOSE 284* 160*  BUN 72* 76*  CREATININE 6.87* 6.74*  CALCIUM 7.0* 7.5*   LFT Recent Labs    12/14/22 1700  PROT 5.0*  ALBUMIN 1.5*  AST 14*  ALT 14  ALKPHOS 81  BILITOT 0.3   PT/INR Recent Labs    12/15/22 0157  LABPROT 21.9*  INR 1.9*   Hepatitis Panel Recent Labs     12/15/22 0157  HEPBSAG NON REACTIVE      IMPRESSION:  #87 57 year old African-American female, largely bedbound at home with chronic pain, on chronic Dilaudid at home, who presented to the emergency room with complaints of acute on chronic abdominal pain nausea poor appetite and diarrhea over the past 3 to 4 weeks.  On further questioning patient had run out of Dilaudid because she was taking more than prescribed.  I suspect this is the primary reason for the increase in abdominal pain  CT unremarkable today  #2 diarrhea new over the past month-have a hospitalization in September concerns for osteomyelitis and had been on antibiotics at that time  Will need to rule out antibiotic induced diarrhea, C. Difficile And treated for gout during this last admission and was discharged on allopurinol and colchicine-need to consider as etiology for diarrhea as well.   #3 anemia acute on chronic, iron studies most consistent with anemia of chronic disease in this patient who has end-stage renal disease and is on dialysis. Do not get any distinct history of recent hematochezia or melena She does have pressure ulceration between her thighs which is weeping blood.  #4 end-stage renal disease on dialysis-missed dialysis 5 days ago  #5 chronic right heel ulcer per notes likely needs vascular consultation Also has pressure changes on the left heel, no eschar and chronic pressure changes of bilateral buttocks and thighs  #6 diabetes mellitus #7 history of duodenal carcinoma, status post endoscopic resection 2021  Recent EGD 2022 with grade a esophagitis, and significant retained food consistent with underlying gastroparesis  #8 probable gastroparesis exacerbated by chronic narcotic use  PLAN: GI path panel, C. difficile quick screen Trend hemoglobins, transfuse as indicated PPI daily Hold Eliquis Okay for diet as tolerated from GI perspective Resume her usual doses of Dilaudid Do not anticipate  need for endoscopic evaluation at this time, GI will follow with you     Amy Esterwood PA-C 12/15/2022, 4:41 PM  I have taken an interval history, thoroughly reviewed the chart and examined the patient. I agree with the Advanced Practitioner's note, impression and recommendations, and have recorded additional findings, impressions and recommendations below. I performed a substantive portion of this encounter (>50% time spent), including a complete performance of the medical decision making.  My additional thoughts are as follows:  Years of chronic abdominal pain reportedly due to gastroparesis, takes Dilaudid regularly but sometimes runs out, which prompted her visit to the ED today.  Her pain is apparently worse with dialysis, has prior GI workup other local practices.  Acute on chronic anemia that is likely multifactorial in this medically complex patient on hemodialysis.  There have been conflicting reports of whether or not there is lately been some GI bleeding.  Apparently this patient's mother told the admitting physician or ED doctor that there have been bleeding with the last few days.  She told me that the bleeding had been "years ago".  Which she is most concerned about in addition to the pain and how sleepy her daughter is much of the time is that she has also had persistent diarrhea for weeks, roughly coinciding with the most recent hospital stay.  On that occasion, she was given broad-spectrum antibiotics for an initial clinical concern for sepsis.  At this point, no current plans for EGD or colonoscopy.  Transfuse patient, observe and characterize any overt GI bleeding that she has to determine if scopes are necessary.  Judicious use of opioids  Assess heel and sacral decubitus ulcers to see if any concerns for infection.  Stool studies to rule out infectious source of diarrhea, particularly C. difficile.  We will follow   Charlie Pitter III Office:[phone]

## 2022-12-15 NOTE — ED Notes (Addendum)
The pt reports she has chest pain wants something for pain  unha[py that she gets pills now instead of injection  she has to wait for the med to come up from pharmacy

## 2022-12-15 NOTE — ED Notes (Signed)
Report has been called  to rn on 6n ok to come up

## 2022-12-15 NOTE — Progress Notes (Signed)
[facility] Progress Note  Subjective: see in HD unit, no c/o's today  Vitals:   12/15/22 1100 12/15/22 1130 12/15/22 1208 12/15/22 1222  BP: 136/83 126/83 112/80 127/72  Pulse: 86 92 93 95  Resp: 16 17 (!) 21 20  Temp:   (!) 97.5 F (36.4 C)   TempSrc:   Oral   SpO2: 99% 100% 100% 100%  Weight:      Height:        Exam: General: chronically ill appearing, no acute distress HEENT: anicteric sclera, MMM CV: normal rate, no murmurs, no edema Lungs: bilateral chest rise, normal wob Abd: soft, ttp, non-distended Skin: no visible lesions or rashes Psych: tired but will wake up and be alert, engaged, appropriate mood and affect Neuro: normal speech, no gross focal deficits     [facility] HD[facility]   F[facility]   400/800   3K/2.5 bath  126.9kg   RDC   - mircera 225 mcg last 10/5 - hectorol 7 mcg    Assessment/ Plan: ESRD: under dialyzed as of late given missed and shorten treatments.  Will plan dialysis this morning.  Volume - dry wt 126kg. Try to get standing wt.  Hypertension - cont home BP lowering meds.  Anemia esrd - Hemoglobin 5.8.  Not due for ESA at this time.  Transfusions per primary team.  Consider workup for GI bleeding. Secondary Hyperparathyroidism/Hyperphosphatemia - Calcium 7 but corrects to normal when accounting for albumin.  Will give Hectorol with dialysis tomorrow.  Obtain phosphorus  Vascular access - TDC in place Abdominal pain: CT scan without acute concern.  Consider constipation given opioid use.  Management per primary team   Vinson Moselle MD  [facility] 12/15/2022, 1:22 PM  Recent Labs  Lab 12/14/22 1700 12/15/22 0535  HGB 5.8* 7.0*  ALBUMIN 1.5*  --   CALCIUM 7.0* 7.5*  PHOS  --  8.2*  CREATININE 6.87* 6.74*  K 4.0 4.1   Recent Labs  Lab 12/15/22 0535  IRON 22*  TIBC 141*  FERRITIN 1,919*   Inpatient medications:  allopurinol  100 mg Oral BID   atorvastatin  10 mg Oral QHS   busPIRone  5 mg Oral BID   DULoxetine  40 mg Oral Daily    ferric citrate  420 mg Oral TID WC   folic acid  1 mg Oral Daily   Gerhardt's butt cream   Topical BID   insulin aspart  0-6 Units Subcutaneous TID WC   insulin aspart  2 Units Subcutaneous TID WC   insulin glargine-yfgn  15 Units Subcutaneous QHS   leptospermum manuka honey  1 Application Topical Daily   midodrine  5 mg Oral TID WC   mirtazapine  15 mg Oral QHS   pantoprazole (PROTONIX) IV  40 mg Intravenous Q12H    acetaminophen, albuterol, HYDROmorphone **OR** HYDROmorphone, ondansetron (ZOFRAN) IV

## 2022-12-15 NOTE — Progress Notes (Signed)
Pt receives out-pt HD at [facility] on MWF. Will assist as needed.   Cedricka Sackrider Renal Navigator [phone] 

## 2022-12-15 NOTE — ED Notes (Signed)
PT is in severe abdominal pain and requests her dilaudid.I informed the pt that the dilaudid wasn't verified by the pharmacy yet and it was in pill form, PT stated that she did not want that and that she needed something that was gonna hit her fast.

## 2022-12-15 NOTE — ED Notes (Signed)
Pt still in mri 

## 2022-12-16 ENCOUNTER — Encounter: Payer: Self-pay | Admitting: Nephrology

## 2022-12-16 DIAGNOSIS — K921 Melena: Secondary | ICD-10-CM

## 2022-12-16 DIAGNOSIS — R197 Diarrhea, unspecified: Secondary | ICD-10-CM | POA: Diagnosis not present

## 2022-12-16 DIAGNOSIS — D649 Anemia, unspecified: Secondary | ICD-10-CM | POA: Diagnosis not present

## 2022-12-16 LAB — GLUCOSE, CAPILLARY
Glucose-Capillary: 79 mg/dL (ref 70–99)
Glucose-Capillary: 79 mg/dL (ref 70–99)
Glucose-Capillary: 98 mg/dL (ref 70–99)
Glucose-Capillary: 87 mg/dL (ref 70–99)

## 2022-12-16 LAB — PATHOLOGIST SMEAR REVIEW

## 2022-12-16 LAB — HEPATITIS B SURFACE ANTIBODY, QUANTITATIVE: Hep B S AB Quant (Post): 283 m[IU]/mL

## 2022-12-16 IMAGING — DX DG ABDOMEN 1V
2 series · 2 of 2 positions shown · non-contrast
Comparison: Radiograph dated 10/07/2020 CT dated 10/22/2020

CLINICAL DATA: Abdominal pain.

EXAM:
ABDOMEN - 1 VIEW

[abdomen kub (1 of 2)]
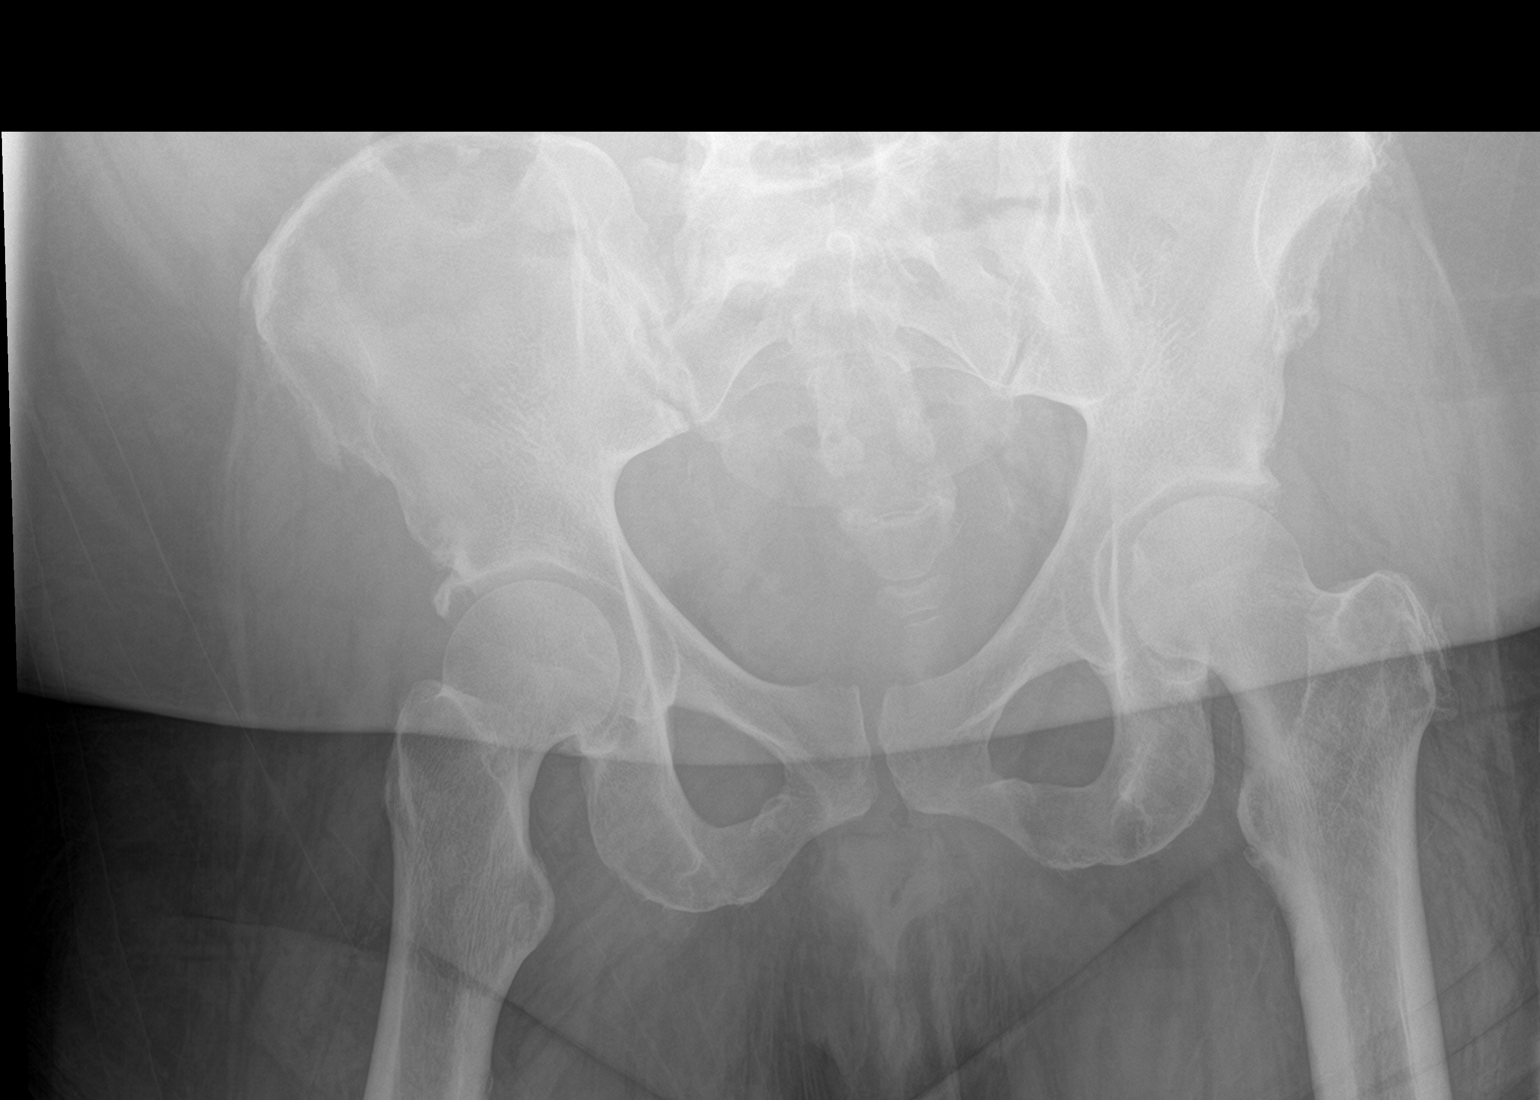

[abdomen kub (2 of 2)]
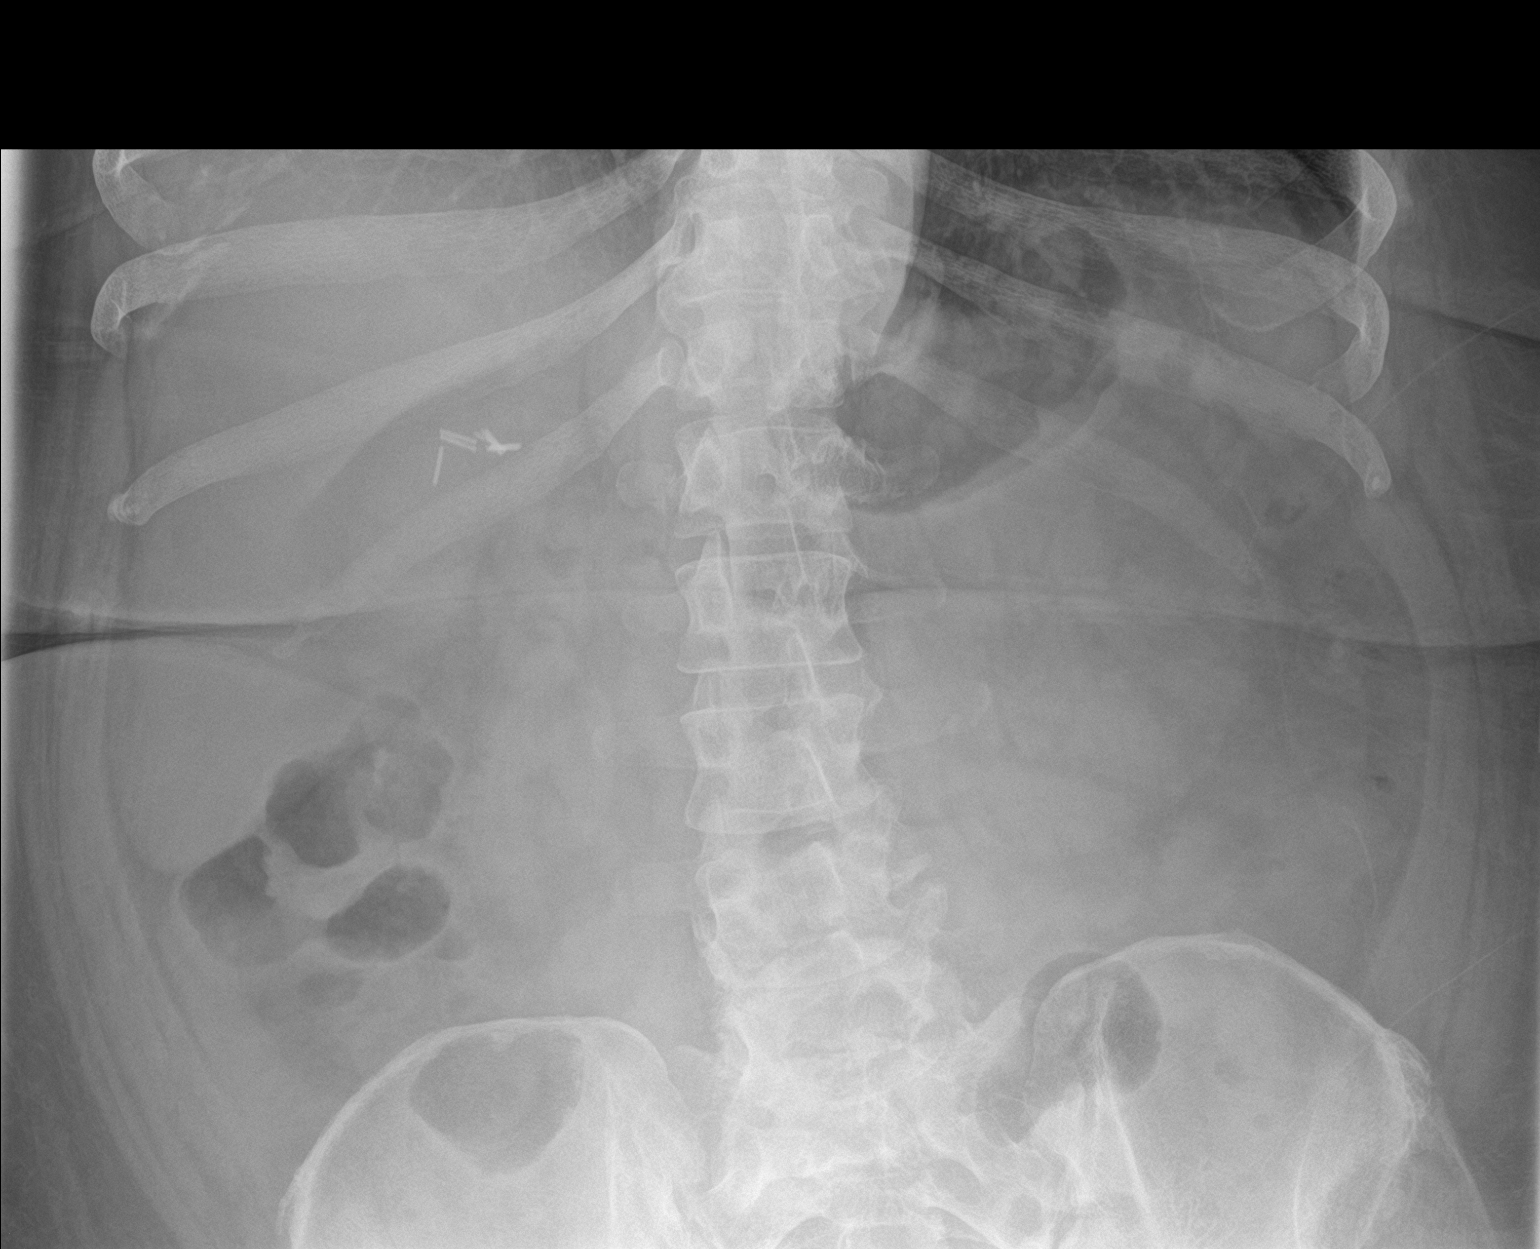

[2 of 2 positions shown; findings below may reference images not displayed]

FINDINGS: No bowel dilatation or evidence of obstruction. No free air or
radiopaque calculi. Right upper quadrant cholecystectomy clips.
Degenerative changes of the spine. No acute osseous pathology.
IMPRESSION: Negative.

## 2022-12-16 MED ORDER — PANTOPRAZOLE SODIUM 40 MG PO TBEC
40.0000 mg | DELAYED_RELEASE_TABLET | Freq: Two times a day (BID) | ORAL | Status: DC
  Administered 2022-12-16 – 2022-12-29 (×23): 40 mg via ORAL
  Filled 2022-12-16 (×25): qty 1

## 2022-12-16 NOTE — Progress Notes (Signed)
PROGRESS NOTE  Deborah [name] VFI:[id number] DOB: [birth year] DOA: 12/14/2022 PCP: Pcp, No   LOS: 2 days   Brief Narrative / Interim history: 57 y.o. female with hx of ESRD on M/W/F HD, PAF on Eliquis, HFpEF, HTN, DM-2, malignant carcinoid, NAFLD, morbid obesity, chronic ulceration R ankle, recent admission 9/16 -24 initially suspected sepsis from SSTI but ultimately sepsis ruled out, and antibiotics were stopped. That admission underwent angiogram which showed adequate perfusion of the R foot. Treated for gout involving R ankle. Returns due to initial complaint of abd pain, per ED note 1 day history persistent abd pain. Had also missed last HD session, last session on 10/4. Found to have anemia, with Hb of 5.8. On further history, per RN report patients mother provided collateral of 1 week of bright red blood per rectum   Subjective / 24h Interval events: Awake this morning, states that she feels "good".  Has not had a bowel movement yet  Assesement and Plan: Principal problem Acute on chronic anemia, concern for acute blood loss anemia due to lower GI bleed -gastroenterology consulted, appreciate input.  Transfuse as needed to keep hemoglobin above 7. -Continue PPI, hold Eliquis, patient has not had any further bowel movement since being here. -Unfortunately CBC before 5 AM this morning was not drawn yet  Active problems ESRD -nephrology consulted, appreciate input.  Intermittently EGD  A-fib-hold home apixaban in setting of bleed, not on rate controlling agents.  If hemoglobin remains stable may resume Eliquis when cleared by GI  Acute toxic encephalopathy, possibly due to opiate medication -monitor, reduce home Dilaudid, hold gabapentin.  Improving.  Underwent an MRI of the brain which is negative  Chronic leukocytosis-recently on steroids for gout.  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  Right foot MRI done just 2 weeks ago without evidence of osteomyelitis  Chronic back pain, spinal stenosis, lumbar region with neurogenic claudication -now wheelchair-bound for several months, underwent an MRI of the lumbar spine earlier this year. It showed lower lumbar spondylosis worsening since 2019, worst at L4-5, with facet arthropathy with joint effusion and periarticular edema and unchanged severe right neuroforaminal narrowing at L5-S1.  Neurosurgery evaluated patient March 2024 and did not recommend surgical approach at this point but continued PT, outpatient follow-up  Scheduled Meds:  allopurinol  100 mg Oral BID   atorvastatin  10 mg Oral QHS   busPIRone  5 mg Oral BID   DULoxetine  40 mg Oral Daily   ferric citrate  420 mg Oral TID WC   folic acid  1 mg Oral Daily   Gerhardt's butt cream   Topical BID   insulin aspart  0-6 Units Subcutaneous TID WC   insulin aspart  2 Units Subcutaneous TID WC   insulin glargine-yfgn  15 Units Subcutaneous QHS   leptospermum manuka honey  1 Application Topical Daily   midodrine  5 mg Oral TID WC   mirtazapine  15 mg Oral QHS   pantoprazole (PROTONIX) IV  40 mg Intravenous Q12H   Continuous Infusions:   PRN Meds:.acetaminophen, albuterol, HYDROmorphone **OR** HYDROmorphone, ondansetron (ZOFRAN) IV  Current Outpatient Medications  Medication Instructions   albuterol (PROVENTIL) 2.5 mg, Nebulization, Every 6 hours PRN   allopurinol (ZYLOPRIM) 100 mg, Oral, 2 times daily   apixaban (ELIQUIS)  5 mg, Oral, 2 times daily   atorvastatin (LIPITOR) 10 mg, Oral, Daily at bedtime   busPIRone (BUSPAR) 5 mg, Oral, 2 times daily   cetirizine (ZYRTEC) 10 MG tablet Oral   colchicine 0.3 mg, Oral, 2 times weekly   doxercalciferol (HECTOROL) 4 mcg,  Intravenous, Every M-W-F (Hemodialysis)   DULoxetine HCl 40 MG CPEP Oral   EASY COMFORT PEN NEEDLES 31G X 5 MM MISC USE 3 TIMES A DAY FOR INSULIN ADMINISTRATION   ferric citrate (AURYXIA) 420 mg, Oral, 3 times daily with meals   fluticasone (FLONASE) 50 MCG/ACT nasal spray 2 sprays, Each Nare, Daily PRN   folic acid (FOLVITE) 1 mg, Oral, Daily   gabapentin (NEURONTIN) 100 mg, Oral, 3 times daily   HYDROmorphone (DILAUDID) 4 mg, Oral, 3 times daily PRN, Three times a day as needed 20 days out of the month.   insulin aspart (NOVOLOG) 5 Units, Subcutaneous, 3 times daily with meals   insulin glargine-yfgn (SEMGLEE) 15 Units, Subcutaneous, 2 times daily   insulin lispro (HUMALOG) 100 UNIT/ML KwikPen Before each meal 3 times a day, 140-199 - 2 units, 200-250 - 6 units, 251-299 - 8 units,  300-349 - 12 units,  350 or above 14 units.   loperamide (IMODIUM A-D) 2 mg, Oral, Every 6 hours PRN   melatonin 3 MG TABS tablet Oral   midodrine (PROAMATINE) 5 mg, Oral, 3 times daily with meals   mirtazapine (REMERON) 15 mg, Oral, Daily at bedtime   Multiple Vitamin (MULTIVITAMIN WITH MINERALS) TABS tablet 1 tablet, Oral, Daily   omeprazole (PRILOSEC) 20 MG capsule Oral   ondansetron (ZOFRAN ODT) 4 mg, Oral, Every 8 hours PRN   Vitamin D (Ergocalciferol) (DRISDOL) 50,000 Units, Oral, Weekly    Diet Orders (From admission, onward)     Start     Ordered   12/14/22 2037  Diet renal with fluid restriction Fluid restriction: 1200 mL Fluid; Room service appropriate? Yes; Fluid consistency: Thin  Diet effective now       Question Answer Comment  Fluid restriction: 1200 mL Fluid   Room service appropriate? Yes   Fluid consistency: Thin      12/14/22 2039            DVT prophylaxis: SCDs Start: 12/14/22 2037   Lab Results  Component Value Date   PLT 319 12/15/2022      Code Status: Full Code  Family Communication: no family at bedside   Status is: Inpatient Remains inpatient appropriate  because: watch for gi bleed  Level of care: Telemetry Medical  Consultants:  Nephrology GI  Objective: Vitals:   12/15/22 2033 12/15/22 2350 12/16/22 0404 12/16/22 0830  BP: 113/76 102/63 131/78 112/62  Pulse: 95 96 93 85  Resp: 20 18 19 17   Temp: 98.8 F (37.1 C) 98.5 F (36.9 C) 98 F (36.7 C) 97.6 F (36.4 C)  TempSrc: Oral Oral Oral Oral  SpO2: 100% 99% 100% 100%  Weight: 128.7 kg     Height:        Intake/Output Summary (Last 24 hours) at 12/16/2022 1031 Last data filed at 12/15/2022 1226 Gross per 24 hour  Intake --  Output 2800 ml  Net -2800 ml   Wt Readings from Last 3 Encounters:  12/15/22 128.7 kg  11/28/22 127.5 kg  10/19/22 132.9 kg    Examination:  Constitutional: NAD Eyes: lids and conjunctivae normal, no scleral icterus ENMT: mmm Neck: normal, supple Respiratory: clear to auscultation bilaterally, no wheezing, no crackles.  Normal respiratory effort.  Cardiovascular: Regular rate and rhythm, no murmurs / rubs / gallops. No LE edema. Abdomen: soft, no distention, no tenderness. Bowel sounds positive.    Data Reviewed: I have independently reviewed following labs and imaging studies   CBC Recent Labs  Lab 12/14/22 1700 12/15/22 0535  WBC 21.0* 24.1*  HGB 5.8* 7.0*  HCT 19.5* 23.2*  PLT 285 319  MCV 92.9 93.5  MCH 27.6 28.2  MCHC 29.7* 30.2  RDW 18.3* 17.7*  LYMPHSABS 1.0 1.4  MONOABS 0.6 0.7  EOSABS 0.1 0.2  BASOSABS 0.1 0.1    Recent Labs  Lab 12/14/22 1700 12/15/22 0157 12/15/22 0535  NA 132*  --  136  K 4.0  --  4.1  CL 105  --  106  CO2 15*  --  16*  GLUCOSE 284*  --  160*  BUN 72*  --  76*  CREATININE 6.87*  --  6.74*  CALCIUM 7.0*  --  7.5*  AST 14*  --   --   ALT 14  --   --   ALKPHOS 81  --   --   BILITOT 0.3  --   --   ALBUMIN 1.5*  --   --   MG  --   --  1.7  INR  --  1.9*  --   BNP 368.9*  --   --      ------------------------------------------------------------------------------------------------------------------ No results for input(s): "CHOL", "HDL", "LDLCALC", "TRIG", "CHOLHDL", "LDLDIRECT" in the last 72 hours.  Lab Results  Component Value Date   HGBA1C 8.1 (H) 06/19/2022   ------------------------------------------------------------------------------------------------------------------ No results for input(s): "TSH", "T4TOTAL", "T3FREE", "THYROIDAB" in the last 72 hours.  Invalid input(s): "FREET3"  Cardiac Enzymes No results for input(s): "CKMB", "TROPONINI", "MYOGLOBIN" in the last 168 hours.  Invalid input(s): "CK" ------------------------------------------------------------------------------------------------------------------    Component Value Date/Time   BNP 368.9 (H) 12/14/2022 1700    CBG: Recent Labs  Lab 12/15/22 1633 12/15/22 1918 12/15/22 1958 12/15/22 2121 12/16/22 0943  GLUCAP 89 79 83 85 79    No results found for this or any previous visit (from the past 240 hour(s)).   Radiology Studies: MR BRAIN WO CONTRAST  Result Date: 12/15/2022 CLINICAL DATA:  Initial evaluation for mental status change, unknown cause. EXAM: MRI HEAD WITHOUT CONTRAST TECHNIQUE: Multiplanar, multiecho pulse sequences of the brain and surrounding structures were obtained without intravenous contrast. COMPARISON:  Prior study from 05/29/2022. FINDINGS: Brain: Examination technically limited due to extensive motion artifact. Additionally, and axial T1 and coronal T2 weighted sequences are not provided or available for review. Cerebral volume within normal limits patchy signal abnormality seen involving the pons, most likely chronic microvascular ischemic disease. No evidence for acute or subacute ischemia. Gray-white matter differentiation maintained. No areas of chronic cortical infarction. No visible acute or chronic intracranial blood products. No mass lesion, midline shift,  or mass effect. No hydrocephalus or extra-axial fluid collection. Pituitary gland and suprasellar region within normal limits. Vascular: Major intracranial vascular flow voids are maintained. Skull and upper cervical spine: Craniocervical junction within normal limits. Bone marrow signal intensity grossly normal. No scalp soft tissue abnormality. Sinuses/Orbits: Globes and orbital soft tissues within normal limits. Prior bilateral ocular lens replacement. No significant paranasal sinus disease. Trace bilateral mastoid effusions noted, of doubtful significance. Other: None. IMPRESSION: 1. Technically limited exam due to extensive motion artifact. 2. No acute intracranial abnormality. 3. Mild chronic microvascular ischemic disease involving the pons. Otherwise negative brain MRI for age. Electronically  Signed   By: Rise Mu M.D.   On: 12/15/2022 20:02     Pamella Pert, MD, PhD [facility]  Between 7 am - 7 pm I am available, please contact me via Amion (for emergencies) or Securechat (non urgent messages)  Between 7 pm - 7 am I am not available, please contact night coverage MD/APP via Amion

## 2022-12-16 NOTE — Progress Notes (Addendum)
[facility] Progress Note   Subjective:   Patient seen and examined at bedside.  Reports feeling poorly overall.  Admits to nausea.  Reports intermittent SOB but a little better now.  Denies CP, blood loss, vomiting and diarrhea. Confused.    Objective Vitals:   12/15/22 2033 12/15/22 2350 12/16/22 0404 12/16/22 0830  BP: 113/76 102/63 131/78 112/62  Pulse: 95 96 93 85  Resp: 20 18 19 17   Temp: 98.8 F (37.1 C) 98.5 F (36.9 C) 98 F (36.7 C) 97.6 F (36.4 C)  TempSrc: Oral Oral Oral Oral  SpO2: 100% 99% 100% 100%  Weight: 128.7 kg     Height:       Physical Exam General:chronically ill appearing female in NAD, AAOx1 Heart:RRR, no mrg Lungs:CTAB, nml WOB on RA Abdomen:soft, NTND Extremities:no LE edema, large malodorous wound on R heel w/drainage Dialysis Access[facility]   Filed Weights   12/14/22 1451 12/15/22 2033  Weight: 127.5 kg 128.7 kg    Intake/Output Summary (Last 24 hours) at 12/16/2022 1442 Last data filed at 12/16/2022 0800 Gross per 24 hour  Intake 0 ml  Output --  Net 0 ml    Additional Objective Labs: Basic Metabolic Panel: Recent Labs  Lab 12/14/22 1700 12/15/22 0535  NA 132* 136  K 4.0 4.1  CL 105 106  CO2 15* 16*  GLUCOSE 284* 160*  BUN 72* 76*  CREATININE 6.87* 6.74*  CALCIUM 7.0* 7.5*  PHOS  --  8.2*   Liver Function Tests: Recent Labs  Lab 12/14/22 1700  AST 14*  ALT 14  ALKPHOS 81  BILITOT 0.3  PROT 5.0*  ALBUMIN 1.5*   Recent Labs  Lab 12/14/22 1700  LIPASE 61*   CBC: Recent Labs  Lab 12/14/22 1700 12/15/22 0535  WBC 21.0* 24.1*  NEUTROABS 19.1* 21.3*  HGB 5.8* 7.0*  HCT 19.5* 23.2*  MCV 92.9 93.5  PLT 285 319   CBG: Recent Labs  Lab 12/15/22 1918 12/15/22 1958 12/15/22 2121 12/16/22 0943 12/16/22 1205  GLUCAP 79 83 85 79 79   Iron Studies:  Recent Labs    12/15/22 0535  IRON 22*  TIBC 141*  TRANSFERRIN 97*  FERRITIN 1,919*   Lab Results  Component Value Date   INR 1.9 (H)  12/15/2022   INR 1.8 (H) 11/23/2022   INR 1.1 10/08/2020   Studies/Results: MR BRAIN WO CONTRAST  Result Date: 12/15/2022 CLINICAL DATA:  Initial evaluation for mental status change, unknown cause. EXAM: MRI HEAD WITHOUT CONTRAST TECHNIQUE: Multiplanar, multiecho pulse sequences of the brain and surrounding structures were obtained without intravenous contrast. COMPARISON:  Prior study from 05/29/2022. FINDINGS: Brain: Examination technically limited due to extensive motion artifact. Additionally, and axial T1 and coronal T2 weighted sequences are not provided or available for review. Cerebral volume within normal limits patchy signal abnormality seen involving the pons, most likely chronic microvascular ischemic disease. No evidence for acute or subacute ischemia. Gray-white matter differentiation maintained. No areas of chronic cortical infarction. No visible acute or chronic intracranial blood products. No mass lesion, midline shift, or mass effect. No hydrocephalus or extra-axial fluid collection. Pituitary gland and suprasellar region within normal limits. Vascular: Major intracranial vascular flow voids are maintained. Skull and upper cervical spine: Craniocervical junction within normal limits. Bone marrow signal intensity grossly normal. No scalp soft tissue abnormality. Sinuses/Orbits: Globes and orbital soft tissues within normal limits. Prior bilateral ocular lens replacement. No significant paranasal sinus disease. Trace bilateral mastoid effusions noted, of doubtful  significance. Other: None. IMPRESSION: 1. Technically limited exam due to extensive motion artifact. 2. No acute intracranial abnormality. 3. Mild chronic microvascular ischemic disease involving the pons. Otherwise negative brain MRI for age. Electronically Signed   By: Rise Mu M.D.   On: 12/15/2022 20:02   CT ABDOMEN PELVIS WO CONTRAST  Result Date: 12/14/2022 CLINICAL DATA:  Acute, nonlocalized abdominal pain.  Weakness with diarrhea for 4 days, nausea, and vomiting. EXAM: CT ABDOMEN AND PELVIS WITHOUT CONTRAST TECHNIQUE: Multidetector CT imaging of the abdomen and pelvis was performed following the standard protocol without IV contrast. RADIATION DOSE REDUCTION: This exam was performed according to the departmental dose-optimization program which includes automated exposure control, adjustment of the mA and/or kV according to patient size and/or use of iterative reconstruction technique. COMPARISON:  11/25/2022 FINDINGS: Lower chest:  No contributory findings. Hepatobiliary: No focal liver abnormality.Cholecystectomy. No biliary dilatation Pancreas: Unremarkable. Spleen: Unremarkable. Adrenals/Urinary Tract: Negative adrenals. No hydronephrosis or stone. Symmetric perinephric stranding that is nonspecific. Unremarkable bladder. Stomach/Bowel:  No obstruction. No appendicitis. Vascular/Lymphatic: No acute vascular abnormality. Atheromatous calcification of the aorta and iliacs. No mass or adenopathy. Reproductive:No pathologic findings. Exophytic uterine fibroid towards the left at the fundus measuring nearly 5 cm Other: No ascites or pneumoperitoneum. Musculoskeletal: No acute abnormalities. Lumbar spine degeneration with mild L4-5 anterolisthesis. IMPRESSION: No acute finding or change since prior. No bowel obstruction or visible inflammation. Electronically Signed   By: Tiburcio Pea M.D.   On: 12/14/2022 19:18    Medications:   allopurinol  100 mg Oral BID   atorvastatin  10 mg Oral QHS   busPIRone  5 mg Oral BID   DULoxetine  40 mg Oral Daily   ferric citrate  420 mg Oral TID WC   folic acid  1 mg Oral Daily   Gerhardt's butt cream   Topical BID   insulin aspart  0-6 Units Subcutaneous TID WC   insulin aspart  2 Units Subcutaneous TID WC   insulin glargine-yfgn  15 Units Subcutaneous QHS   leptospermum manuka honey  1 Application Topical Daily   midodrine  5 mg Oral TID WC   mirtazapine  15 mg Oral  QHS   pantoprazole  40 mg Oral BID AC    D[facility] Orders: [facility]   F180   400/800   3K/2.5 bath  126.9kg   RDC   - mircera 225 mcg last 10/5 - hectorol 7 mcg       Assessment/ Plan: ESRD: under dialyzed as of late given missed and shorten treatments.  HD tomorrow per regular schedule. Chronic Wounds - on R heel and LE. Drainage coming from heel. Wound care following.  AMS - MRI completed with no acute changes. Per PMD Volume - does not look volume overloaded. UF as tolerated.  Hypertension - BP in goal.  Cont home BP lowering meds.  Acute on Chronic Anemia esrd - Hemoglobin 5.8 on admit.  Hgb 7.0 s/p 1 unit pRBC.  Not due for ESA at this time.  No iron d/t high ferritin. Transfusions per primary team.  GI consulting. CT abd/pelvis with no acute findings.  Secondary Hyperparathyroidism/Hyperphosphatemia - Calcium 7 but corrects to normal when accounting for albumin.  Phos elevated.  Continue binders and VDRA. Vascular access - TDC in place Abdominal pain: CT scan without acute concern.  Consider constipation given opioid use.  Management per primary team  Virgina Norfolk, PA-C [facility] 12/16/2022,2:42 PM  LOS: 2 days

## 2022-12-16 NOTE — Plan of Care (Signed)
Patient with multiple areas of skin breakdown. Encouraged patient to be repositioned frequently but patient refuses saying she turns herself. Wound consult placed for new evaluation of skin breakdown. Patient medicated as per orders for complaints of stomach pain with minimal relief. Will continue to monitor.   Problem: Clinical Measurements: Goal: Ability to maintain clinical measurements within normal limits will improve Outcome: Progressing

## 2022-12-16 NOTE — Consult Note (Signed)
WOC Nurse Consult Note: Consult requested for multiple wounds.  This was already performed on 10/9; please refer to previous progress notes.  Topical treatment orders have been provided for bedside nurses to perform. Please re-consult if further assistance is needed.  Thank-you,  Cammie Mcgee MSN, RN, CWOCN, [place], CNS [phone]

## 2022-12-16 NOTE — Progress Notes (Signed)
   12/16/22 1508  TOC Brief Assessment  Insurance and Status Reviewed  Patient has primary care physician Yes Dorothyann Peng)  Home environment has been reviewed yes  Prior/Current Home Services Current home services [facility])  Readmission risk has been reviewed Yes  Transition of care needs transition of care needs identified, TOC will continue to follow   Spoke to patient and her friend at bedside.   Patient from home with husband and has HHRN with [facility] ( confirmed with Marylene Land with [employer]).   Friend states patient may benefit from a SNF stay for wound treatment.   Patient very upset at present . Friend states her mother is coming to visit later today . Mother currently at a funeral

## 2022-12-16 NOTE — Progress Notes (Addendum)
Patient ID: Deborah [name], female   DOB: 1965-06-27, 57 y.o.   MRN: [id number]    Progress Note   Subjective  Day # 2 CC; chronic abdominal pain, acute exacerbation, possible lower GI bleeding, diarrhea  Patient resting in bed, says that she did not try any liquid breakfast, still nauseated but not vomiting, still complaining of abdominal pain rather generalized, she has not had any bowel movements or diarrhea since admission, and no evidence of any rectal bleeding.  Her Dilaudid has been resumed though at reduced doses  Stool studies ordered but not completed as no bowel movements Today's labs pending   Objective   Vital signs in last 24 hours: Temp:  [97.5 F (36.4 C)-98.8 F (37.1 C)] 97.6 F (36.4 C) (10/10 0830) Pulse Rate:  [85-97] 85 (10/10 0830) Resp:  [17-26] 17 (10/10 0830) BP: (99-142)/(62-92) 112/62 (10/10 0830) SpO2:  [99 %-100 %] 100 % (10/10 0830) Weight:  [128.7 kg] 128.7 kg (10/09 2033) Last BM Date : 12/14/22 General: Obese, chronically ill-appearing older African-American female in NAD Heart:  Regular rate and rhythm; no murmurs Lungs: Respirations even and unlabored, lungs CTA bilaterally Abdomen:  Soft, obese, tender generally to light palpation, bowel sounds are present  . Neurologic:  Alert and oriented,  grossly normal neurologically. Psych:  Cooperative. Normal mood and affect.  Intake/Output from previous day: 10/09 0701 - 10/10 0700 In: -  Out: 2800  Intake/Output this shift: No intake/output data recorded.  Lab Results: Recent Labs    12/14/22 1700 12/15/22 0535  WBC 21.0* 24.1*  HGB 5.8* 7.0*  HCT 19.5* 23.2*  PLT 285 319   BMET Recent Labs    12/14/22 1700 12/15/22 0535  NA 132* 136  K 4.0 4.1  CL 105 106  CO2 15* 16*  GLUCOSE 284* 160*  BUN 72* 76*  CREATININE 6.87* 6.74*  CALCIUM 7.0* 7.5*   LFT Recent Labs    12/14/22 1700  PROT 5.0*  ALBUMIN 1.5*  AST 14*  ALT 14  ALKPHOS 81  BILITOT 0.3    PT/INR Recent Labs    12/15/22 0157  LABPROT 21.9*  INR 1.9*    Studies/Results: MR BRAIN WO CONTRAST  Result Date: 12/15/2022 CLINICAL DATA:  Initial evaluation for mental status change, unknown cause. EXAM: MRI HEAD WITHOUT CONTRAST TECHNIQUE: Multiplanar, multiecho pulse sequences of the brain and surrounding structures were obtained without intravenous contrast. COMPARISON:  Prior study from 05/29/2022. FINDINGS: Brain: Examination technically limited due to extensive motion artifact. Additionally, and axial T1 and coronal T2 weighted sequences are not provided or available for review. Cerebral volume within normal limits patchy signal abnormality seen involving the pons, most likely chronic microvascular ischemic disease. No evidence for acute or subacute ischemia. Gray-white matter differentiation maintained. No areas of chronic cortical infarction. No visible acute or chronic intracranial blood products. No mass lesion, midline shift, or mass effect. No hydrocephalus or extra-axial fluid collection. Pituitary gland and suprasellar region within normal limits. Vascular: Major intracranial vascular flow voids are maintained. Skull and upper cervical spine: Craniocervical junction within normal limits. Bone marrow signal intensity grossly normal. No scalp soft tissue abnormality. Sinuses/Orbits: Globes and orbital soft tissues within normal limits. Prior bilateral ocular lens replacement. No significant paranasal sinus disease. Trace bilateral mastoid effusions noted, of doubtful significance. Other: None. IMPRESSION: 1. Technically limited exam due to extensive motion artifact. 2. No acute intracranial abnormality. 3. Mild chronic microvascular ischemic disease involving the pons. Otherwise negative brain MRI for age. Electronically Signed  By: Rise Mu M.D.   On: 12/15/2022 20:02   CT ABDOMEN PELVIS WO CONTRAST  Result Date: 12/14/2022 CLINICAL DATA:  Acute, nonlocalized  abdominal pain. Weakness with diarrhea for 4 days, nausea, and vomiting. EXAM: CT ABDOMEN AND PELVIS WITHOUT CONTRAST TECHNIQUE: Multidetector CT imaging of the abdomen and pelvis was performed following the standard protocol without IV contrast. RADIATION DOSE REDUCTION: This exam was performed according to the departmental dose-optimization program which includes automated exposure control, adjustment of the mA and/or kV according to patient size and/or use of iterative reconstruction technique. COMPARISON:  11/25/2022 FINDINGS: Lower chest:  No contributory findings. Hepatobiliary: No focal liver abnormality.Cholecystectomy. No biliary dilatation Pancreas: Unremarkable. Spleen: Unremarkable. Adrenals/Urinary Tract: Negative adrenals. No hydronephrosis or stone. Symmetric perinephric stranding that is nonspecific. Unremarkable bladder. Stomach/Bowel:  No obstruction. No appendicitis. Vascular/Lymphatic: No acute vascular abnormality. Atheromatous calcification of the aorta and iliacs. No mass or adenopathy. Reproductive:No pathologic findings. Exophytic uterine fibroid towards the left at the fundus measuring nearly 5 cm Other: No ascites or pneumoperitoneum. Musculoskeletal: No acute abnormalities. Lumbar spine degeneration with mild L4-5 anterolisthesis. IMPRESSION: No acute finding or change since prior. No bowel obstruction or visible inflammation. Electronically Signed   By: Tiburcio Pea M.D.   On: 12/14/2022 19:18       Assessment / Plan:    #7 57 year old African-American female, with chronic pain syndrome, on chronic Dilaudid at home largely bedbound due to multiple comorbidities who presented to the emergency room yesterday with complaints of worsening abdominal pain, poor appetite and diarrhea over the past 3 to 4 weeks.  On further questioning it was determined that the patient had run out of Dilaudid as she was taking more than prescribed for increased pain and suspect this is the primary  reason for her increase in abdominal pain.  CT unremarkable  She very likely does have underlying gastroparesis exacerbated by chronic narcotic use  #2 diarrhea-x 3 to 4 weeks--has not had any bowel movement since arrival to the emergency room  #3 concerns for rectal bleeding-patient stated yesterday that she had not noticed any melena or hematochezia over the past couple of weeks Hemoglobin has been stable follow-up labs today  #4 anemia acute on chronic-consistent with anemia of chronic disease in setting of end-stage renal disease and dialysis  #5 diabetes mellitus  #6 history of duodenal carcinoid status post endoscopic resection 2022  Plan; advance diet as she tolerates Discontinue enteric precautions as she has not had any diarrhea Trend  hemoglobin, transfuse as indicated Continue daily PPI-changed to p.o. If her hemoglobin is stable today, and she has not had any bowel movements and no evidence of GI bleeding can resume Eliquis  No plans for endoscopic evaluation at this time  GI will be available if needed    Principal Problem:   Anemia in chronic kidney disease, on chronic dialysis [facility]) Active Problems:   Chronic diarrhea     LOS: 2 days   Amy Esterwood  12/16/2022, 11:47 AM  I have taken an interval history, thoroughly reviewed the chart and examined the patient. I agree with the Advanced Practitioner's note, impression and recommendations, and have recorded additional findings, impressions and recommendations below. I performed a substantive portion of this encounter (>50% time spent), including a complete performance of the medical decision making.  My additional thoughts are as follows:  No diarrhea or lower GI bleeding since admission.  If that continues to be the case tomorrow, resume oral anticoagulation.  No current plans for  colonoscopy.  Call as needed.   Charlie Pitter III [phone]

## 2022-12-17 DIAGNOSIS — D649 Anemia, unspecified: Secondary | ICD-10-CM | POA: Diagnosis not present

## 2022-12-17 LAB — CBC
HCT: 23.9 % — ABNORMAL LOW (ref 36.0–46.0)
Hemoglobin: 7.2 g/dL — ABNORMAL LOW (ref 12.0–15.0)
MCH: 28.2 pg (ref 26.0–34.0)
MCHC: 30.1 g/dL (ref 30.0–36.0)
MCV: 93.7 fL (ref 80.0–100.0)
Platelets: 446 10*3/uL — ABNORMAL HIGH (ref 150–400)
RBC: 2.55 MIL/uL — ABNORMAL LOW (ref 3.87–5.11)
RDW: 17.8 % — ABNORMAL HIGH (ref 11.5–15.5)
WBC: 22.4 10*3/uL — ABNORMAL HIGH (ref 4.0–10.5)
nRBC: 0 % (ref 0.0–0.2)

## 2022-12-17 LAB — COMPREHENSIVE METABOLIC PANEL
ALT: 13 U/L (ref 0–44)
AST: 8 U/L — ABNORMAL LOW (ref 15–41)
Albumin: 1.8 g/dL — ABNORMAL LOW (ref 3.5–5.0)
Alkaline Phosphatase: 97 U/L (ref 38–126)
Anion gap: 18 — ABNORMAL HIGH (ref 5–15)
BUN: 45 mg/dL — ABNORMAL HIGH (ref 6–20)
CO2: 19 mmol/L — ABNORMAL LOW (ref 22–32)
Calcium: 7.9 mg/dL — ABNORMAL LOW (ref 8.9–10.3)
Chloride: 99 mmol/L (ref 98–111)
Creatinine, Ser: 5.98 mg/dL — ABNORMAL HIGH (ref 0.44–1.00)
GFR, Estimated: 8 mL/min — ABNORMAL LOW (ref >60)
Glucose, Bld: 94 mg/dL (ref 70–99)
Potassium: 4.2 mmol/L (ref 3.5–5.1)
Sodium: 136 mmol/L (ref 135–145)
Total Bilirubin: 0.8 mg/dL (ref 0.3–1.2)
Total Protein: 6 g/dL — ABNORMAL LOW (ref 6.5–8.1)

## 2022-12-17 LAB — GLUCOSE, CAPILLARY
Glucose-Capillary: 82 mg/dL (ref 70–99)
Glucose-Capillary: 103 mg/dL — ABNORMAL HIGH (ref 70–99)
Glucose-Capillary: 89 mg/dL (ref 70–99)

## 2022-12-17 LAB — MAGNESIUM: Magnesium: 1.8 mg/dL (ref 1.7–2.4)

## 2022-12-17 MED ORDER — PENTAFLUOROPROP-TETRAFLUOROETH EX AERO: 1.0000 | INHALATION_SPRAY | CUTANEOUS | Status: DC | PRN

## 2022-12-17 MED ORDER — HEPARIN SODIUM (PORCINE) 1000 UNIT/ML DIALYSIS
1000.0000 [IU] | INTRAMUSCULAR | Status: DC | PRN
  Filled 2022-12-17: qty 1

## 2022-12-17 MED ORDER — ANTICOAGULANT SODIUM CITRATE 4% (200MG/5ML) IV SOLN: 5.0000 mL | Status: DC | PRN

## 2022-12-17 MED ORDER — LIDOCAINE-PRILOCAINE 2.5-2.5 % EX CREA: 1.0000 | TOPICAL_CREAM | CUTANEOUS | Status: DC | PRN

## 2022-12-17 MED ORDER — ALTEPLASE 2 MG IJ SOLR: 2.0000 mg | Freq: Once | INTRAMUSCULAR | Status: DC | PRN

## 2022-12-17 MED ORDER — LIDOCAINE HCL (PF) 1 % IJ SOLN: 5.0000 mL | INTRAMUSCULAR | Status: DC | PRN

## 2022-12-17 NOTE — Progress Notes (Signed)
[facility] Progress Note   Subjective:    Seen and examined patient on HD. Tolerating UFG 1.5L. BP is 153/71. Remains confused.   Objective Vitals:   12/17/22 1200 12/17/22 1230 12/17/22 1251 12/17/22 1256  BP: (!) 158/80 (!) 167/87 (!) 157/67   Pulse: 78 82    Resp: 14 13    Temp:   97.6 F (36.4 C)   TempSrc:   Axillary   SpO2: 98% 99%    Weight:    126 kg  Height:       Physical Exam General:chronically ill appearing female in NAD, AAOx1 Heart:RRR, no mrg Lungs:CTAB, nml WOB on RA Abdomen:soft, NTND Extremities:no LE edema, large malodorous wound on R heel w/drainage Dialysis Access: [facility]   Filed Weights   12/15/22 2033 12/17/22 0901 12/17/22 1256  Weight: 128.7 kg 125.7 kg 126 kg    Intake/Output Summary (Last 24 hours) at 12/17/2022 1345 Last data filed at 12/17/2022 1251 Gross per 24 hour  Intake 120 ml  Output 1500 ml  Net -1380 ml    Additional Objective Labs: Basic Metabolic Panel: Recent Labs  Lab 12/14/22 1700 12/15/22 0535 12/17/22 0848  NA 132* 136 136  K 4.0 4.1 4.2  CL 105 106 99  CO2 15* 16* 19*  GLUCOSE 284* 160* 94  BUN 72* 76* 45*  CREATININE 6.87* 6.74* 5.98*  CALCIUM 7.0* 7.5* 7.9*  PHOS  --  8.2*  --    Liver Function Tests: Recent Labs  Lab 12/14/22 1700 12/17/22 0848  AST 14* 8*  ALT 14 13  ALKPHOS 81 97  BILITOT 0.3 0.8  PROT 5.0* 6.0*  ALBUMIN 1.5* 1.8*   Recent Labs  Lab 12/14/22 1700  LIPASE 61*   CBC: Recent Labs  Lab 12/14/22 1700 12/15/22 0535 12/17/22 0848  WBC 21.0* 24.1* 22.4*  NEUTROABS 19.1* 21.3*  --   HGB 5.8* 7.0* 7.2*  HCT 19.5* 23.2* 23.9*  MCV 92.9 93.5 93.7  PLT 285 319 446*   Blood Culture    Component Value Date/Time   SDES BLOOD SITE NOT SPECIFIED 11/23/2022 0023   SPECREQUEST  11/23/2022 0023    BOTTLES DRAWN AEROBIC AND ANAEROBIC Blood Culture adequate volume   CULT  11/23/2022 0023    NO GROWTH 5 DAYS Performed at [facility], [address],  [place], [place] [address]    REPTSTATUS 11/28/2022 FINAL 11/23/2022 0023    Cardiac Enzymes: No results for input(s): "CKTOTAL", "CKMB", "CKMBINDEX", "TROPONINI" in the last 168 hours. CBG: Recent Labs  Lab 12/15/22 2121 12/16/22 0943 12/16/22 1205 12/16/22 1634 12/16/22 2207  GLUCAP 85 79 79 98 87   Iron Studies:  Recent Labs    12/15/22 0535  IRON 22*  TIBC 141*  TRANSFERRIN 97*  FERRITIN 1,919*   Lab Results  Component Value Date   INR 1.9 (H) 12/15/2022   INR 1.8 (H) 11/23/2022   INR 1.1 10/08/2020   Studies/Results: MR BRAIN WO CONTRAST  Result Date: 12/15/2022 CLINICAL DATA:  Initial evaluation for mental status change, unknown cause. EXAM: MRI HEAD WITHOUT CONTRAST TECHNIQUE: Multiplanar, multiecho pulse sequences of the brain and surrounding structures were obtained without intravenous contrast. COMPARISON:  Prior study from 05/29/2022. FINDINGS: Brain: Examination technically limited due to extensive motion artifact. Additionally, and axial T1 and coronal T2 weighted sequences are not provided or available for review. Cerebral volume within normal limits patchy signal abnormality seen involving the pons, most likely chronic microvascular ischemic disease. No evidence for acute  or subacute ischemia. Gray-white matter differentiation maintained. No areas of chronic cortical infarction. No visible acute or chronic intracranial blood products. No mass lesion, midline shift, or mass effect. No hydrocephalus or extra-axial fluid collection. Pituitary gland and suprasellar region within normal limits. Vascular: Major intracranial vascular flow voids are maintained. Skull and upper cervical spine: Craniocervical junction within normal limits. Bone marrow signal intensity grossly normal. No scalp soft tissue abnormality. Sinuses/Orbits: Globes and orbital soft tissues within normal limits. Prior bilateral ocular lens replacement. No significant paranasal sinus disease. Trace bilateral  mastoid effusions noted, of doubtful significance. Other: None. IMPRESSION: 1. Technically limited exam due to extensive motion artifact. 2. No acute intracranial abnormality. 3. Mild chronic microvascular ischemic disease involving the pons. Otherwise negative brain MRI for age. Electronically Signed   By: Rise Mu M.D.   On: 12/15/2022 20:02    Medications:   allopurinol  100 mg Oral BID   atorvastatin  10 mg Oral QHS   busPIRone  5 mg Oral BID   DULoxetine  40 mg Oral Daily   ferric citrate  420 mg Oral TID WC   folic acid  1 mg Oral Daily   Gerhardt's butt cream   Topical BID   insulin aspart  0-6 Units Subcutaneous TID WC   insulin aspart  2 Units Subcutaneous TID WC   insulin glargine-yfgn  15 Units Subcutaneous QHS   leptospermum manuka honey  1 Application Topical Daily   midodrine  5 mg Oral TID WC   mirtazapine  15 mg Oral QHS   pantoprazole  40 mg Oral BID AC    Dialysis Orders: [facility]   F180   400/800   3K/2.5 bath  126.9kg   RDC   - mircera 225 mcg last 10/5 - hectorol 7 mcg  Assessment/Plan: ESRD: under dialyzed as of late given missed and shorten treatments.  On HD per routine schedule. Chronic Wounds - on R heel and LE. Drainage coming from heel. Wound care following.  AMS - MRI completed with no acute changes. Per PMD Volume - does not look volume overloaded. UF as tolerated.  Hypertension - BP in goal.  Cont home BP lowering meds.  Acute on Chronic Anemia esrd - Hemoglobin 5.8 on admit.  Hgb 7.0 s/p 1 unit pRBC.  Hgb now 7.2. Not due for ESA at this time.  No iron d/t high ferritin. Transfusions per primary team.  GI consulting. CT abd/pelvis with no acute findings.  Secondary Hyperparathyroidism/Hyperphosphatemia - Calcium 7 but corrects to normal when accounting for albumin.  Phos elevated.  Continue binders and VDRA. Vascular access - TDC in place Abdominal pain: CT scan without acute concern.  Consider constipation given opioid use.  Management  per primary team  Salome Holmes, NP [facility] 12/17/2022,1:45 PM  LOS: 3 days

## 2022-12-17 NOTE — Plan of Care (Signed)
  Problem: Coping: Goal: Ability to adjust to condition or change in health will improve Outcome: Progressing   Problem: Health Behavior/Discharge Planning: Goal: Ability to identify and utilize available resources and services will improve Outcome: Progressing   Problem: Metabolic: Goal: Ability to maintain appropriate glucose levels will improve Outcome: Progressing   Problem: Nutritional: Goal: Maintenance of adequate nutrition will improve Outcome: Progressing

## 2022-12-17 NOTE — Progress Notes (Signed)
PROGRESS NOTE  Deborah [name] GNF:[id number] DOB: [birth year] DOA: 12/14/2022 PCP: Pcp, No   LOS: 3 days   Brief Narrative / Interim history: 57 y.o. female with hx of ESRD on M/W/F HD, PAF on Eliquis, HFpEF, HTN, DM-2, malignant carcinoid, NAFLD, morbid obesity, chronic ulceration R ankle, recent admission 9/16 -24 initially suspected sepsis from SSTI but ultimately sepsis ruled out, and antibiotics were stopped. That admission underwent angiogram which showed adequate perfusion of the R foot. Treated for gout involving R ankle. Returns due to initial complaint of abd pain, per ED note 1 day history persistent abd pain. Had also missed last HD session, last session on 10/4. Found to have anemia, with Hb of 5.8. On further history, per RN report patients mother provided collateral of 1 week of bright red blood per rectum   Subjective / 24h Interval events: Complains of pain, in her belly and back.  Reports that this is her chronic pain  Assesement and Plan: Principal problem Acute on chronic anemia, concern for acute blood loss anemia due to lower GI bleed -gastroenterology consulted, appreciate input.  Transfuse as needed to keep hemoglobin above 7. -Continue PPI, hold Eliquis, patient has not had any further bowel movement since being here. -Hemoglobin has overall remained stable and clinically she is without bleeding -Gastroenterology consulted, given lack of bleeding I think it would be all right to resume Eliquis either later today or tomorrow  Active problems ESRD -nephrology consulted, appreciate input.  HD today  A-fib-hold home apixaban in setting of bleed, not on rate controlling agents.  If hemoglobin remains stable may resume Eliquis when cleared by GI  Acute toxic encephalopathy, possibly due to opiate medication -monitor, reduce home Dilaudid, hold gabapentin.  Improving.  Underwent an MRI of the brain which is negative  Chronic leukocytosis-recently on steroids for gout.   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  Right foot MRI done just 2 weeks ago without evidence of osteomyelitis  Chronic back pain, spinal stenosis, lumbar region with neurogenic claudication -now wheelchair-bound for several months, underwent an MRI of the lumbar spine earlier this year. It showed lower lumbar spondylosis worsening since 2019, worst at L4-5, with facet arthropathy with joint effusion and periarticular edema and unchanged severe right neuroforaminal narrowing at L5-S1.  Neurosurgery evaluated patient March 2024 and did not recommend surgical approach at this point but continued PT, outpatient follow-up -PT consult today  Scheduled Meds:  allopurinol  100 mg Oral BID   atorvastatin  10 mg Oral QHS   busPIRone  5 mg Oral BID   DULoxetine  40 mg Oral Daily   ferric citrate  420 mg Oral TID WC   folic acid  1 mg Oral Daily   Gerhardt's butt cream   Topical BID   insulin aspart  0-6 Units Subcutaneous TID WC   insulin aspart  2 Units Subcutaneous TID WC   insulin glargine-yfgn  15 Units Subcutaneous QHS   leptospermum manuka honey  1 Application Topical Daily   midodrine  5 mg Oral TID WC   mirtazapine  15 mg Oral QHS   pantoprazole  40 mg Oral BID AC   Continuous Infusions:  anticoagulant sodium citrate      PRN Meds:.acetaminophen, albuterol, alteplase, anticoagulant sodium citrate, heparin, HYDROmorphone **OR** HYDROmorphone,  lidocaine (PF), lidocaine-prilocaine, ondansetron (ZOFRAN) IV, pentafluoroprop-tetrafluoroeth  Current Outpatient Medications  Medication Instructions   albuterol (PROVENTIL) 2.5 mg, Nebulization, Every 6 hours PRN   allopurinol (ZYLOPRIM) 100 mg, Oral, 2  times daily   apixaban (ELIQUIS) 5 mg, Oral, 2 times daily   atorvastatin (LIPITOR) 10 mg, Oral, Daily   busPIRone (BUSPAR) 5 mg, Oral, 2 times daily   carvedilol (COREG) 12.5 mg, Oral, 2 times daily   cetirizine (ZYRTEC) 10 mg, Oral, Daily   colchicine 0.3 mg, Oral, 2 times weekly   doxercalciferol (HECTOROL) 4 mcg, Intravenous, Every M-W-F (Hemodialysis)   EASY COMFORT PEN NEEDLES 31G X 5 MM MISC USE 3 TIMES A DAY FOR INSULIN ADMINISTRATION   famotidine (PEPCID) 20 mg, Oral, Daily   ferric citrate (AURYXIA) 420 mg, Oral, 3 times daily with meals   fluticasone (FLONASE) 50 MCG/ACT nasal spray 2 sprays, Each Nare, Daily PRN   gabapentin (NEURONTIN) 100 mg, Oral, 3 times daily   hydrALAZINE (APRESOLINE) 50 mg, Oral, 3 times daily   HYDROmorphone (DILAUDID) 4 mg, Oral, 3 times daily PRN, Three times a day as needed 20 days out of the month.   insulin lispro (HUMALOG) 100 UNIT/ML KwikPen Before each meal 3 times a day, 140-199 - 2 units, 200-250 - 6 units, 251-299 - 8 units,  300-349 - 12 units,  350 or above 14 units.   isosorbide mononitrate (IMDUR) 60 mg, Oral, Daily   midodrine (PROAMATINE) 5 mg, Oral, 3 times daily with meals   mirtazapine (REMERON) 15 mg, Oral, Daily at bedtime   pantoprazole (PROTONIX) 40 mg, Oral, 2 times daily   spironolactone (ALDACTONE) 12.5 mg, Oral, Daily   torsemide (DEMADEX) 50 mg, Oral, 2 times daily    Diet Orders (From admission, onward)     Start     Ordered   12/14/22 2037  Diet renal with fluid restriction Fluid restriction: 1200 mL Fluid; Room service appropriate? Yes; Fluid consistency: Thin  Diet effective now       Question Answer Comment  Fluid restriction: 1200 mL Fluid   Room service appropriate? Yes   Fluid consistency: Thin      12/14/22 2039            DVT prophylaxis: SCDs Start: 12/14/22 2037   Lab Results  Component Value Date   PLT 446 (H) 12/17/2022      Code Status: Full Code  Family Communication: no family at  bedside   Status is: Inpatient Remains inpatient appropriate because: watch for gi bleed  Level of care: Telemetry Medical  Consultants:  Nephrology GI  Objective: Vitals:   12/17/22 0901 12/17/22 0939 12/17/22 1000 12/17/22 1030  BP: 115/68 129/67 137/66 137/63  Pulse: 87 79 76 73  Resp: 20 16 16 15   Temp: (!) 97.2 F (36.2 C)     TempSrc: Oral     SpO2: 100% 98% 100% 100%  Weight: 125.7 kg     Height:        Intake/Output Summary (Last 24 hours) at 12/17/2022 1053 Last data filed at 12/16/2022 1700 Gross per 24 hour  Intake 240 ml  Output --  Net 240 ml   Wt Readings from Last 3 Encounters:  12/17/22 125.7 kg  11/28/22 127.5 kg  10/19/22 132.9 kg    Examination:  Constitutional: NAD Eyes: lids and conjunctivae normal, no scleral icterus ENMT: mmm Neck: normal, supple Respiratory: clear to auscultation bilaterally, no wheezing, no crackles. Normal respiratory effort.  Cardiovascular: Regular rate and rhythm,  no murmurs / rubs / gallops. No LE edema. Abdomen: soft, no distention, no tenderness. Bowel sounds positive.    Data Reviewed: I have independently reviewed following labs and imaging studies   CBC Recent Labs  Lab 12/14/22 1700 12/15/22 0535 12/17/22 0848  WBC 21.0* 24.1* 22.4*  HGB 5.8* 7.0* 7.2*  HCT 19.5* 23.2* 23.9*  PLT 285 319 446*  MCV 92.9 93.5 93.7  MCH 27.6 28.2 28.2  MCHC 29.7* 30.2 30.1  RDW 18.3* 17.7* 17.8*  LYMPHSABS 1.0 1.4  --   MONOABS 0.6 0.7  --   EOSABS 0.1 0.2  --   BASOSABS 0.1 0.1  --     Recent Labs  Lab 12/14/22 1700 12/15/22 0157 12/15/22 0535 12/17/22 0848  NA 132*  --  136 136  K 4.0  --  4.1 4.2  CL 105  --  106 99  CO2 15*  --  16* 19*  GLUCOSE 284*  --  160* 94  BUN 72*  --  76* 45*  CREATININE 6.87*  --  6.74* 5.98*  CALCIUM 7.0*  --  7.5* 7.9*  AST 14*  --   --  8*  ALT 14  --   --  13  ALKPHOS 81  --   --  97  BILITOT 0.3  --   --  0.8  ALBUMIN 1.5*  --   --  1.8*  MG  --   --  1.7 1.8   INR  --  1.9*  --   --   BNP 368.9*  --   --   --     ------------------------------------------------------------------------------------------------------------------ No results for input(s): "CHOL", "HDL", "LDLCALC", "TRIG", "CHOLHDL", "LDLDIRECT" in the last 72 hours.  Lab Results  Component Value Date   HGBA1C 8.1 (H) 06/19/2022   ------------------------------------------------------------------------------------------------------------------ No results for input(s): "TSH", "T4TOTAL", "T3FREE", "THYROIDAB" in the last 72 hours.  Invalid input(s): "FREET3"  Cardiac Enzymes No results for input(s): "CKMB", "TROPONINI", "MYOGLOBIN" in the last 168 hours.  Invalid input(s): "CK" ------------------------------------------------------------------------------------------------------------------    Component Value Date/Time   BNP 368.9 (H) 12/14/2022 1700    CBG: Recent Labs  Lab 12/15/22 2121 12/16/22 0943 12/16/22 1205 12/16/22 1634 12/16/22 2207  GLUCAP 85 79 79 98 87    No results found for this or any previous visit (from the past 240 hour(s)).   Radiology Studies: No results found.   Pamella Pert, MD, PhD [facility]  Between 7 am - 7 pm I am available, please contact me via Amion (for emergencies) or Securechat (non urgent messages)  Between 7 pm - 7 am I am not available, please contact night coverage MD/APP via Amion

## 2022-12-17 NOTE — Progress Notes (Signed)
   12/17/22 1251  Vitals  Temp 97.6 F (36.4 C)  Temp Source Axillary  BP (!) 157/67  BP Location Right Arm  BP Method Automatic  Patient Position (if appropriate) Lying  Oxygen Therapy  O2 Device Room Air  During Treatment Monitoring  HD Safety Checks Performed Yes  Intra-Hemodialysis Comments Tx completed  Post Treatment  Dialyzer Clearance Lightly streaked  Hemodialysis Intake (mL) 0 mL  Liters Processed 72  Fluid Removed (mL) 1500 mL  Tolerated HD Treatment Yes  Post-Hemodialysis Comments Pt goal met.  Hemodialysis Catheter Right Subclavian Double lumen Permanent (Tunneled)  Placement Date/Time: 06/24/22 1644   Serial / Lot #: [id number]  Expiration Date: 09/08/26  Time Out: Correct patient;Correct site;Correct procedure  Maximum sterile barrier precautions: Hand hygiene;Mask;Cap;Sterile gloves;Sterile gown;Large sterile ...  Site Condition No complications  Blue Lumen Status Heparin locked  Red Lumen Status Heparin locked  Catheter fill solution Heparin 1000 units/ml  Catheter fill volume (Arterial) 1.9 cc  Catheter fill volume (Venous) 1.9  Dressing Type Transparent  Dressing Status Antimicrobial disc in place;Clean, Dry, Intact  Interventions New dressing  Drainage Description None  Dressing Change Due 12/21/22  Post treatment catheter status Capped and Clamped

## 2022-12-18 DIAGNOSIS — D649 Anemia, unspecified: Secondary | ICD-10-CM | POA: Diagnosis not present

## 2022-12-18 LAB — TYPE AND SCREEN
ABO/RH(D): O POS
Antibody Screen: NEGATIVE
Donor AG Type: NEGATIVE
Donor AG Type: NEGATIVE
Unit division: 0
Unit division: 0

## 2022-12-18 LAB — GLUCOSE, CAPILLARY
Glucose-Capillary: 99 mg/dL (ref 70–99)
Glucose-Capillary: 99 mg/dL (ref 70–99)
Glucose-Capillary: 116 mg/dL — ABNORMAL HIGH (ref 70–99)
Glucose-Capillary: 115 mg/dL — ABNORMAL HIGH (ref 70–99)

## 2022-12-18 LAB — CBC
HCT: 25.9 % — ABNORMAL LOW (ref 36.0–46.0)
Hemoglobin: 7.8 g/dL — ABNORMAL LOW (ref 12.0–15.0)
MCH: 28.8 pg (ref 26.0–34.0)
MCHC: 30.1 g/dL (ref 30.0–36.0)
MCV: 95.6 fL (ref 80.0–100.0)
Platelets: 439 10*3/uL — ABNORMAL HIGH (ref 150–400)
RBC: 2.71 MIL/uL — ABNORMAL LOW (ref 3.87–5.11)
RDW: 17.3 % — ABNORMAL HIGH (ref 11.5–15.5)
WBC: 23.9 10*3/uL — ABNORMAL HIGH (ref 4.0–10.5)
nRBC: 0 % (ref 0.0–0.2)

## 2022-12-18 LAB — BPAM RBC
Blood Product Expiration Date: 202410212359
Blood Product Expiration Date: 202410212359
ISSUE DATE / TIME: 202410082033
Unit Type and Rh: 5100
Unit Type and Rh: 5100

## 2022-12-18 MED ORDER — MIRTAZAPINE 15 MG PO TABS
7.5000 mg | ORAL_TABLET | Freq: Every day | ORAL | Status: DC
  Administered 2022-12-18 – 2022-12-31 (×12): 7.5 mg via ORAL
  Filled 2022-12-18 (×14): qty 1

## 2022-12-18 MED ORDER — HYDROMORPHONE HCL 2 MG PO TABS
2.0000 mg | ORAL_TABLET | Freq: Three times a day (TID) | ORAL | Status: DC | PRN
  Administered 2022-12-18 – 2022-12-22 (×9): 2 mg via ORAL
  Filled 2022-12-18 (×9): qty 1

## 2022-12-18 NOTE — Progress Notes (Signed)
[facility] Progress Note   Subjective:    Seen and examined patient at bedside. Oriented to self only and remains confused. Became tearful with me today. Tolerated yesterday HD with net UF 1.5L.   Objective Vitals:   12/17/22 2044 12/18/22 0449 12/18/22 0450 12/18/22 0730  BP: 133/63 (!) 124/57  (!) 115/54  Pulse: 82 81  81  Resp: (!) 22 20  17   Temp: 98.8 F (37.1 C) 98.2 F (36.8 C)  98.2 F (36.8 C)  TempSrc: Oral Oral    SpO2: 100% 97%  99%  Weight:   128.1 kg   Height:       Physical Exam General:chronically ill appearing female in NAD, obese, AAOx1 Heart:RRR, no mrg Lungs:CTAB, nml WOB on RA Abdomen:soft, NTND Extremities:no LE edema, large malodorous wound on R heel w/drainage Dialysis Access: [facility]   Filed Weights   12/17/22 0901 12/17/22 1256 12/18/22 0450  Weight: 125.7 kg 126 kg 128.1 kg    Intake/Output Summary (Last 24 hours) at 12/18/2022 1135 Last data filed at 12/17/2022 1251 Gross per 24 hour  Intake --  Output 1500 ml  Net -1500 ml    Additional Objective Labs: Basic Metabolic Panel: Recent Labs  Lab 12/14/22 1700 12/15/22 0535 12/17/22 0848  NA 132* 136 136  K 4.0 4.1 4.2  CL 105 106 99  CO2 15* 16* 19*  GLUCOSE 284* 160* 94  BUN 72* 76* 45*  CREATININE 6.87* 6.74* 5.98*  CALCIUM 7.0* 7.5* 7.9*  PHOS  --  8.2*  --    Liver Function Tests: Recent Labs  Lab 12/14/22 1700 12/17/22 0848  AST 14* 8*  ALT 14 13  ALKPHOS 81 97  BILITOT 0.3 0.8  PROT 5.0* 6.0*  ALBUMIN 1.5* 1.8*   Recent Labs  Lab 12/14/22 1700  LIPASE 61*   CBC: Recent Labs  Lab 12/14/22 1700 12/15/22 0535 12/17/22 0848  WBC 21.0* 24.1* 22.4*  NEUTROABS 19.1* 21.3*  --   HGB 5.8* 7.0* 7.2*  HCT 19.5* 23.2* 23.9*  MCV 92.9 93.5 93.7  PLT 285 319 446*   Blood Culture    Component Value Date/Time   SDES BLOOD SITE NOT SPECIFIED 11/23/2022 0023   SPECREQUEST  11/23/2022 0023    BOTTLES DRAWN AEROBIC AND ANAEROBIC Blood Culture  adequate volume   CULT  11/23/2022 0023    NO GROWTH 5 DAYS Performed at [facility], [address], [place], [place] [address]    REPTSTATUS 11/28/2022 FINAL 11/23/2022 0023    Cardiac Enzymes: No results for input(s): "CKTOTAL", "CKMB", "CKMBINDEX", "TROPONINI" in the last 168 hours. CBG: Recent Labs  Lab 12/16/22 2207 12/17/22 1352 12/17/22 1822 12/17/22 2045 12/18/22 0732  GLUCAP 87 82 103* 89 99   Iron Studies: No results for input(s): "IRON", "TIBC", "TRANSFERRIN", "FERRITIN" in the last 72 hours. Lab Results  Component Value Date   INR 1.9 (H) 12/15/2022   INR 1.8 (H) 11/23/2022   INR 1.1 10/08/2020   Studies/Results: No results found.  Medications:   allopurinol  100 mg Oral BID   atorvastatin  10 mg Oral QHS   busPIRone  5 mg Oral BID   DULoxetine  40 mg Oral Daily   ferric citrate  420 mg Oral TID WC   folic acid  1 mg Oral Daily   Gerhardt's butt cream   Topical BID   insulin aspart  0-6 Units Subcutaneous TID WC   insulin aspart  2 Units Subcutaneous TID WC  insulin glargine-yfgn  15 Units Subcutaneous QHS   leptospermum manuka honey  1 Application Topical Daily   midodrine  5 mg Oral TID WC   mirtazapine  7.5 mg Oral QHS   pantoprazole  40 mg Oral BID AC    Dialysis Orders: [facility]   F180   400/800   3K/2.5 bath  126.9kg   RDC   - mircera 225 mcg last 10/5 - hectorol 7 mcg  Assessment/Plan: ESRD: under dialyzed as of late given missed and shorten treatments.  On HD per routine schedule. Chronic Wounds - on R heel and LE. Drainage coming from heel. Wound care following.  AMS - MRI completed with no acute changes. Per PMD Volume - does not look volume overloaded. UF as tolerated.  Hypertension - BP in goal.  Cont home BP lowering meds.  Acute on Chronic Anemia esrd - Hemoglobin 5.8 on admit.  Hgb 7.0 s/p 1 unit pRBC.  Hgb now 7.2. Not due for ESA at this time.  No iron d/t high ferritin. Transfusions per primary team.  GI consulting.  CT abd/pelvis with no acute findings.  Secondary Hyperparathyroidism/Hyperphosphatemia - Corrected Ca okay.  Phos elevated.  Continue binders and VDRA. Vascular access - TDC in place Abdominal pain: CT scan without acute concern.  Consider constipation given opioid use.  Management per primary team  Salome Holmes, NP [facility] 12/18/2022,11:35 AM  LOS: 4 days

## 2022-12-18 NOTE — Progress Notes (Signed)
PROGRESS NOTE  Deborah [name]:[id number] DOB: Jan 05, 1966 DOA: 12/14/2022 PCP: Pcp, No   LOS: 4 days   Brief Narrative / Interim history: 57 y.o. female with hx of ESRD on M/W/F HD, PAF on Eliquis, HFpEF, HTN, DM-2, malignant carcinoid, NAFLD, morbid obesity, chronic ulceration R ankle, recent admission 9/16 -24 initially suspected sepsis from SSTI but ultimately sepsis ruled out, and antibiotics were stopped. That admission underwent angiogram which showed adequate perfusion of the R foot. Treated for gout involving R ankle. Returns due to initial complaint of abd pain, per ED note 1 day history persistent abd pain. Had also missed last HD session, last session on 10/4. Found to have anemia, with Hb of 5.8. On further history, per RN report patients mother provided collateral of 1 week of bright red blood per rectum   Subjective / 24h Interval events: No further pain today.  States that she feels well  Assesement and Plan: Principal problem Acute on chronic anemia, concern for acute blood loss anemia due to lower GI bleed -gastroenterology consulted, appreciate input.  Transfuse as needed to keep hemoglobin above 7. -Continue PPI, hold Eliquis, patient has not had any further bowel movement since being here. -Hemoglobin has overall remained stable and clinically she is without bleeding -Gastroenterology consulted, no overt bleeding here.  Hemoglobin stable yesterday, blood work this morning pending  Active problems ESRD -nephrology consulted, appreciate input.  Underwent HD 10/11  A-fib-hold home apixaban in setting of bleed, not on rate controlling agents.  If hemoglobin remains stable, resume Eliquis later today once CBC resulted  Acute toxic encephalopathy, possibly due to opiate medication -monitor, hold home Dilaudid.  Improving.  MRI of the brain unremarkable  Chronic leukocytosis-recently on steroids for gout.  She is afebrile  Metastatic duodenal carcinoid-had resection  in the past  Chronic diastolic CHF-euvolemic, volume management per HD  Chronic pain-continue home Dilaudid at a lower dose  Hyperlipidemia, NAFLD-continue statin  Mood disorder-continue duloxetine, added diazepam  Gout-continue home allopurinol  Chronic wounds-wound care consult.  Right foot MRI done just 2 weeks ago without evidence of osteomyelitis  Chronic back pain, spinal stenosis, lumbar region with neurogenic claudication -now wheelchair-bound for several months, underwent an MRI of the lumbar spine earlier this year. It showed lower lumbar spondylosis worsening since 2019, worst at L4-5, with facet arthropathy with joint effusion and periarticular edema and unchanged severe right neuroforaminal narrowing at L5-S1.  Neurosurgery evaluated patient March 2024 and did not recommend surgical approach at this point but continued PT, outpatient follow-up -PT consult today  Scheduled Meds:  allopurinol  100 mg Oral BID   atorvastatin  10 mg Oral QHS   busPIRone  5 mg Oral BID   DULoxetine  40 mg Oral Daily   ferric citrate  420 mg Oral TID WC   folic acid  1 mg Oral Daily   Gerhardt's butt cream   Topical BID   insulin aspart  0-6 Units Subcutaneous TID WC   insulin aspart  2 Units Subcutaneous TID WC   insulin glargine-yfgn  15 Units Subcutaneous QHS   leptospermum manuka honey  1 Application Topical Daily   midodrine  5 mg Oral TID WC   mirtazapine  15 mg Oral QHS   pantoprazole  40 mg Oral BID AC   Continuous Infusions:    PRN Meds:.acetaminophen, albuterol, HYDROmorphone **OR** HYDROmorphone, ondansetron (ZOFRAN) IV  Current Outpatient Medications  Medication Instructions   albuterol (PROVENTIL) 2.5 mg, Nebulization, Every 6 hours PRN  allopurinol (ZYLOPRIM) 100 mg, Oral, 2 times daily   apixaban (ELIQUIS) 5 mg, Oral, 2 times daily   atorvastatin (LIPITOR) 10 mg, Oral, Daily   busPIRone (BUSPAR) 5 mg, Oral, 2 times daily   carvedilol (COREG) 12.5 mg, Oral, 2 times  daily   cetirizine (ZYRTEC) 10 mg, Oral, Daily   colchicine 0.3 mg, Oral, 2 times weekly   doxercalciferol (HECTOROL) 4 mcg, Intravenous, Every M-W-F (Hemodialysis)   EASY COMFORT PEN NEEDLES 31G X 5 MM MISC USE 3 TIMES A DAY FOR INSULIN ADMINISTRATION   famotidine (PEPCID) 20 mg, Oral, Daily   ferric citrate (AURYXIA) 420 mg, Oral, 3 times daily with meals   fluticasone (FLONASE) 50 MCG/ACT nasal spray 2 sprays, Each Nare, Daily PRN   gabapentin (NEURONTIN) 100 mg, Oral, 3 times daily   hydrALAZINE (APRESOLINE) 50 mg, Oral, 3 times daily   HYDROmorphone (DILAUDID) 4 mg, Oral, 3 times daily PRN, Three times a day as needed 20 days out of the month.   insulin lispro (HUMALOG) 100 UNIT/ML KwikPen Before each meal 3 times a day, 140-199 - 2 units, 200-250 - 6 units, 251-299 - 8 units,  300-349 - 12 units,  350 or above 14 units.   isosorbide mononitrate (IMDUR) 60 mg, Oral, Daily   midodrine (PROAMATINE) 5 mg, Oral, 3 times daily with meals   mirtazapine (REMERON) 15 mg, Oral, Daily at bedtime   pantoprazole (PROTONIX) 40 mg, Oral, 2 times daily   spironolactone (ALDACTONE) 12.5 mg, Oral, Daily   torsemide (DEMADEX) 50 mg, Oral, 2 times daily    Diet Orders (From admission, onward)     Start     Ordered   12/14/22 2037  Diet renal with fluid restriction Fluid restriction: 1200 mL Fluid; Room service appropriate? Yes; Fluid consistency: Thin  Diet effective now       Question Answer Comment  Fluid restriction: 1200 mL Fluid   Room service appropriate? Yes   Fluid consistency: Thin      12/14/22 2039            DVT prophylaxis: SCDs Start: 12/14/22 2037   Lab Results  Component Value Date   PLT 446 (H) 12/17/2022      Code Status: Full Code  Family Communication: no family at bedside, called mother and left message  Status is: Inpatient Remains inpatient appropriate because: watch for gi bleed  Level of care: Telemetry Medical  Consultants:   Nephrology GI  Objective: Vitals:   12/17/22 2044 12/18/22 0449 12/18/22 0450 12/18/22 0730  BP: 133/63 (!) 124/57  (!) 115/54  Pulse: 82 81  81  Resp: (!) 22 20  17   Temp: 98.8 F (37.1 C) 98.2 F (36.8 C)  98.2 F (36.8 C)  TempSrc: Oral Oral    SpO2: 100% 97%  99%  Weight:   128.1 kg   Height:        Intake/Output Summary (Last 24 hours) at 12/18/2022 0959 Last data filed at 12/17/2022 1251 Gross per 24 hour  Intake --  Output 1500 ml  Net -1500 ml   Wt Readings from Last 3 Encounters:  12/18/22 128.1 kg  11/28/22 127.5 kg  10/19/22 132.9 kg    Examination:  Constitutional: NAD Eyes: lids and conjunctivae normal, no scleral icterus ENMT: mmm Neck: normal, supple Respiratory: clear to auscultation bilaterally, no wheezing, no crackles. Normal respiratory effort.  Cardiovascular: Regular rate and rhythm, no murmurs / rubs / gallops. No LE edema. Abdomen: soft, no distention, no tenderness.  Bowel sounds positive.    Data Reviewed: I have independently reviewed following labs and imaging studies   CBC Recent Labs  Lab 12/14/22 1700 12/15/22 0535 12/17/22 0848  WBC 21.0* 24.1* 22.4*  HGB 5.8* 7.0* 7.2*  HCT 19.5* 23.2* 23.9*  PLT 285 319 446*  MCV 92.9 93.5 93.7  MCH 27.6 28.2 28.2  MCHC 29.7* 30.2 30.1  RDW 18.3* 17.7* 17.8*  LYMPHSABS 1.0 1.4  --   MONOABS 0.6 0.7  --   EOSABS 0.1 0.2  --   BASOSABS 0.1 0.1  --     Recent Labs  Lab 12/14/22 1700 12/15/22 0157 12/15/22 0535 12/17/22 0848  NA 132*  --  136 136  K 4.0  --  4.1 4.2  CL 105  --  106 99  CO2 15*  --  16* 19*  GLUCOSE 284*  --  160* 94  BUN 72*  --  76* 45*  CREATININE 6.87*  --  6.74* 5.98*  CALCIUM 7.0*  --  7.5* 7.9*  AST 14*  --   --  8*  ALT 14  --   --  13  ALKPHOS 81  --   --  97  BILITOT 0.3  --   --  0.8  ALBUMIN 1.5*  --   --  1.8*  MG  --   --  1.7 1.8  INR  --  1.9*  --   --   BNP 368.9*  --   --   --      ------------------------------------------------------------------------------------------------------------------ No results for input(s): "CHOL", "HDL", "LDLCALC", "TRIG", "CHOLHDL", "LDLDIRECT" in the last 72 hours.  Lab Results  Component Value Date   HGBA1C 8.1 (H) 06/19/2022   ------------------------------------------------------------------------------------------------------------------ No results for input(s): "TSH", "T4TOTAL", "T3FREE", "THYROIDAB" in the last 72 hours.  Invalid input(s): "FREET3"  Cardiac Enzymes No results for input(s): "CKMB", "TROPONINI", "MYOGLOBIN" in the last 168 hours.  Invalid input(s): "CK" ------------------------------------------------------------------------------------------------------------------    Component Value Date/Time   BNP 368.9 (H) 12/14/2022 1700    CBG: Recent Labs  Lab 12/16/22 2207 12/17/22 1352 12/17/22 1822 12/17/22 2045 12/18/22 0732  GLUCAP 87 82 103* 89 99    No results found for this or any previous visit (from the past 240 hour(s)).   Radiology Studies: No results found.   Pamella Pert, MD, PhD [facility]  Between 7 am - 7 pm I am available, please contact me via Amion (for emergencies) or Securechat (non urgent messages)  Between 7 pm - 7 am I am not available, please contact night coverage MD/APP via Amion

## 2022-12-18 NOTE — Evaluation (Addendum)
Physical Therapy Evaluation Patient Details Name: Deborah [name] MRN: [id number] DOB: 04-28-1965 Today's Date: 12/18/2022  History of Present Illness  The pt is a 57 yo female presenting 10/8 with weakness, diarrhea x4 days, nausea, vomiting, and AMS over last month. Work up revealed Hgb of 5.8, undergoing continued work up for GIB. PMH includes: prior cancer of duodenum, ESRD, GERD, obesity, diabetes, and chronic sacral wounds.   Clinical Impression  Pt in bed upon arrival of PT, agreeable to evaluation at this time. The pt was unable to answer any questions accurately regarding PLOF at this time and no family present to confirm baseline, but pt was independent with bed mobility when working with PT during admission last month. Today she required mod-maxA of 2 to transition to sitting EOB for <72min, pt limited by pain once seated EOB, and due to very poor command following, it was unsafe to progress mobility from EOB. Given pt's current mobility deficits, extensive wounds, and lack of 24/7 assist at home (per notes from last admission, pt with aide 6 days a week 3 hours a day), she is not currently safe to return home, will benefit from post-acute rehab <3hours/day to facilitate return to greatest level of independence with transfers.          If plan is discharge home, recommend the following: Two people to help with walking and/or transfers;A lot of help with bathing/dressing/bathroom;Assist for transportation;Supervision due to cognitive status;Help with stairs or ramp for entrance;Direct supervision/assist for medications management;Direct supervision/assist for financial management   Can travel by private vehicle   No    Equipment Recommendations Other (comment) (defer to post acute, lift and WC)  Recommendations for Other Services  OT consult    Functional Status Assessment Patient has had a recent decline in their functional status and demonstrates the ability to make  significant improvements in function in a reasonable and predictable amount of time.     Precautions / Restrictions Precautions Precautions: Fall Precaution Comments: sacral wounds, bilateral heel wounds Restrictions Other Position/Activity Restrictions: from last admission last month, "Per Dr Lajoyce Corners, ideally will be NWB on rt heel, but put as little weight as possible on rt foot'      Mobility  Bed Mobility Overal bed mobility: Needs Assistance Bed Mobility: Supine to Sit, Sit to Supine     Supine to sit: Mod assist, Max assist, +2 for physical assistance, HOB elevated, Used rails Sit to supine: Max assist, +2 for physical assistance, HOB elevated, Used rails   General bed mobility comments: mod-maxA as pt at times able to assist with trunk or use of rails, assist to move LE and to scoot hips forwards on pad to sit EOB, pt not assisting with return to supine    Transfers                   General transfer comment: deferred due to pt requesting return to bed and poor command following       Balance Overall balance assessment: Needs assistance Sitting-balance support: Bilateral upper extremity supported Sitting balance-Leahy Scale: Poor                                       Pertinent Vitals/Pain Pain Assessment Pain Assessment: Faces Faces Pain Scale: Hurts whole lot Pain Location: stomach, RLE and wound Pain Descriptors / Indicators: Guarding, Moaning, Crying Pain Intervention(s): Limited activity within patient's tolerance,  Monitored during session, Repositioned    Home Living Family/patient expects to be discharged to:: Private residence Living Arrangements: Other relatives Available Help at Discharge: Personal care attendant;Available PRN/intermittently Type of Home: Apartment Home Access: Stairs to enter Entrance Stairs-Rails: Can reach both Entrance Stairs-Number of Steps: 1   Home Layout: One level Home Equipment: BSC/3in1;Shower  seat;Wheelchair - manual Additional Comments: patient has PCA 6 days a week 3 hours a day. all information from last admission, pt with AMS and unable to state on this evaluation    Prior Function Prior Level of Function : Needs assist             Mobility Comments: pivoting to wheelchair; propelling w/c; states husband bumps her up/down (in wheelchair) the single step at entry ADLs Comments: wheelchair level at home. can do bathing and dressing tasks does not wear socks.     Extremity/Trunk Assessment   Upper Extremity Assessment Upper Extremity Assessment: Defer to OT evaluation    Lower Extremity Assessment Lower Extremity Assessment: RLE deficits/detail;LLE deficits/detail;Difficult to assess due to impaired cognition RLE Deficits / Details: significant wound on heel with bandage, RN present to change dressing. pt stating "a little bit" to all sensation testing, unable to move leg on command for MMT RLE: Unable to fully assess due to pain RLE Sensation: decreased light touch LLE Deficits / Details: wound on heel with bandage. pt stating "a little bit" to all sensation testing, unable to move leg on command for MMT but did wiggle toes to command    Cervical / Trunk Assessment Cervical / Trunk Assessment: Other exceptions Cervical / Trunk Exceptions: large body habitus  Communication   Communication Communication: Difficulty following commands/understanding;Difficulty communicating thoughts/reduced clarity of speech Following commands: Follows one step commands inconsistently;Follows one step commands with increased time Cueing Techniques: Verbal cues;Gestural cues;Tactile cues  Cognition Arousal: Alert Behavior During Therapy: Lability (tearful, crying multiple times in session and unable to state why, possibly pain) Overall Cognitive Status: No family/caregiver present to determine baseline cognitive functioning                                 General  Comments: no family present, pt staring off for most of session, unable to answer question "what is your name" until 4th try, not aware of location, time, or situation. following commands <25% of time.        General Comments General comments (skin integrity, edema, etc.): pt with significant R heel wound with dressing falling off, RN notified and came in to dress wound. pt staring off and with difficulty answering questions in session, consistently with head turned to R side and with persistent movements of tongue  and mouth through session        Assessment/Plan    PT Assessment Patient needs continued PT services  PT Problem List Decreased strength;Decreased mobility;Decreased knowledge of use of DME;Impaired sensation;Obesity;Decreased skin integrity;Pain       PT Treatment Interventions DME instruction;Gait training;Functional mobility training;Therapeutic activities;Therapeutic exercise;Balance training;Patient/family education;Wheelchair mobility training    PT Goals (Current goals can be found in the Care Plan section)  Acute Rehab PT Goals Patient Stated Goal: to reduce pain PT Goal Formulation: Patient unable to participate in goal setting Time For Goal Achievement: 01/01/23 Potential to Achieve Goals: Fair    Frequency Min 1X/week     Co-evaluation               AM-PAC  PT "6 Clicks" Mobility  Outcome Measure Help needed turning from your back to your side while in a flat bed without using bedrails?: A Lot Help needed moving from lying on your back to sitting on the side of a flat bed without using bedrails?: Total Help needed moving to and from a bed to a chair (including a wheelchair)?: Total Help needed standing up from a chair using your arms (e.g., wheelchair or bedside chair)?: Total Help needed to walk in hospital room?: Total Help needed climbing 3-5 steps with a railing? : Total 6 Click Score: 7    End of Session   Activity Tolerance: Patient  limited by pain Patient left: in bed;with call bell/phone within reach;with bed alarm set Nurse Communication: Mobility status PT Visit Diagnosis: Muscle weakness (generalized) (M62.81);Difficulty in walking, not elsewhere classified (R26.2)    Time: 7253-6644 PT Time Calculation (min) (ACUTE ONLY): 20 min   Charges:   PT Evaluation $PT Eval Moderate Complexity: 1 Mod   PT General Charges $$ ACUTE PT VISIT: 1 Visit         Vickki Muff, PT, DPT   Acute Rehabilitation Department Office [phone] Secure Chat Communication Preferred  Ronnie Derby 12/18/2022, 12:08 PM

## 2022-12-18 NOTE — Plan of Care (Signed)
  Problem: Cardiovascular: Goal: Vascular access site(s) Level 0-1 will be maintained Outcome: Progressing   Problem: Health Behavior/Discharge Planning: Goal: Ability to safely manage health-related needs after discharge will improve Outcome: Progressing   Problem: Coping: Goal: Ability to adjust to condition or change in health will improve Outcome: Progressing   Problem: Nutritional: Goal: Maintenance of adequate nutrition will improve Outcome: Progressing   Problem: Nutrition: Goal: Adequate nutrition will be maintained Outcome: Progressing

## 2022-12-19 ENCOUNTER — Inpatient Hospital Stay (HOSPITAL_COMMUNITY): Payer: 59

## 2022-12-19 DIAGNOSIS — D649 Anemia, unspecified: Secondary | ICD-10-CM | POA: Diagnosis not present

## 2022-12-19 LAB — GLUCOSE, CAPILLARY
Glucose-Capillary: 90 mg/dL (ref 70–99)
Glucose-Capillary: 82 mg/dL (ref 70–99)
Glucose-Capillary: 76 mg/dL (ref 70–99)
Glucose-Capillary: 78 mg/dL (ref 70–99)

## 2022-12-19 MED ORDER — APIXABAN 5 MG PO TABS
5.0000 mg | ORAL_TABLET | Freq: Two times a day (BID) | ORAL | Status: DC
  Administered 2022-12-19 (×2): 5 mg via ORAL
  Filled 2022-12-19 (×2): qty 1

## 2022-12-19 NOTE — Progress Notes (Addendum)
[facility] Progress Note   Subjective:    Seen and examined at bedside. Confused and tearful. Next HD 10/14.  Objective Vitals:   12/18/22 1954 12/19/22 0500 12/19/22 0533 12/19/22 0802  BP: (!) 156/93  137/81 135/75  Pulse: 85  85 81  Resp: 20  (!) 22 17  Temp: (!) 97.5 F (36.4 C)  97.7 F (36.5 C) 98.2 F (36.8 C)  TempSrc: Oral  Oral   SpO2: 100%  99% 100%  Weight:  128.3 kg    Height:       Physical Exam General:chronically ill appearing female in NAD, obese, AAOx1 Heart:RRR, no mrg Lungs:CTAB, nml WOB on RA Abdomen:soft, NTND Extremities: trace bilateral pedal edema, large malodorous wound on R heel w/drainage Dialysis Access: [facility]   Filed Weights   12/17/22 1256 12/18/22 0450 12/19/22 0500  Weight: 126 kg 128.1 kg 128.3 kg    Intake/Output Summary (Last 24 hours) at 12/19/2022 1048 Last data filed at 12/19/2022 0000 Gross per 24 hour  Intake 120 ml  Output --  Net 120 ml    Additional Objective Labs: Basic Metabolic Panel: Recent Labs  Lab 12/14/22 1700 12/15/22 0535 12/17/22 0848  NA 132* 136 136  K 4.0 4.1 4.2  CL 105 106 99  CO2 15* 16* 19*  GLUCOSE 284* 160* 94  BUN 72* 76* 45*  CREATININE 6.87* 6.74* 5.98*  CALCIUM 7.0* 7.5* 7.9*  PHOS  --  8.2*  --    Liver Function Tests: Recent Labs  Lab 12/14/22 1700 12/17/22 0848  AST 14* 8*  ALT 14 13  ALKPHOS 81 97  BILITOT 0.3 0.8  PROT 5.0* 6.0*  ALBUMIN 1.5* 1.8*   Recent Labs  Lab 12/14/22 1700  LIPASE 61*   CBC: Recent Labs  Lab 12/14/22 1700 12/15/22 0535 12/17/22 0848 12/18/22 1746  WBC 21.0* 24.1* 22.4* 23.9*  NEUTROABS 19.1* 21.3*  --   --   HGB 5.8* 7.0* 7.2* 7.8*  HCT 19.5* 23.2* 23.9* 25.9*  MCV 92.9 93.5 93.7 95.6  PLT 285 319 446* 439*   Blood Culture    Component Value Date/Time   SDES BLOOD SITE NOT SPECIFIED 11/23/2022 0023   SPECREQUEST  11/23/2022 0023    BOTTLES DRAWN AEROBIC AND ANAEROBIC Blood Culture adequate volume   CULT   11/23/2022 0023    NO GROWTH 5 DAYS Performed at [facility], [address] [place], [place] [address]    REPTSTATUS 11/28/2022 FINAL 11/23/2022 0023    Cardiac Enzymes: No results for input(s): "CKTOTAL", "CKMB", "CKMBINDEX", "TROPONINI" in the last 168 hours. CBG: Recent Labs  Lab 12/18/22 0732 12/18/22 1143 12/18/22 1717 12/18/22 1956 12/19/22 0801  GLUCAP 99 99 116* 115* 90   Iron Studies: No results for input(s): "IRON", "TIBC", "TRANSFERRIN", "FERRITIN" in the last 72 hours. Lab Results  Component Value Date   INR 1.9 (H) 12/15/2022   INR 1.8 (H) 11/23/2022   INR 1.1 10/08/2020   Studies/Results: No results found.  Medications:   allopurinol  100 mg Oral BID   atorvastatin  10 mg Oral QHS   busPIRone  5 mg Oral BID   DULoxetine  40 mg Oral Daily   ferric citrate  420 mg Oral TID WC   folic acid  1 mg Oral Daily   Gerhardt's butt cream   Topical BID   insulin aspart  0-6 Units Subcutaneous TID WC   insulin aspart  2 Units Subcutaneous TID WC   insulin glargine-yfgn  15 Units Subcutaneous QHS   leptospermum manuka honey  1 Application Topical Daily   midodrine  5 mg Oral TID WC   mirtazapine  7.5 mg Oral QHS   pantoprazole  40 mg Oral BID AC    Dialysis Orders: [facility]   F[facility]   400/800   3K/2.5 bath  126.9kg   RDC   - mircera 225 mcg last 10/5 - hectorol 7 mcg   Assessment/Plan: ESRD: under dialyzed as of late given missed and shorten treatments.  Continue MWF schedule. Chronic Wounds - on R heel and LE. Drainage coming from heel. Wound care following.  AMS - MRI completed with no acute changes. Per PMD Volume - does not look volume overloaded. UF as tolerated.  Hypertension - BP in goal.  Cont home BP lowering meds.  Acute on Chronic Anemia esrd - Hemoglobin 5.8 on admit.  Hgb 7.0 s/p 1 unit pRBC.  Hgb now 7.8. ESA due on 10/19.  No iron d/t high ferritin. Transfusions per primary team.  GI consulting. CT abd/pelvis with no acute findings.   Secondary Hyperparathyroidism/Hyperphosphatemia - Corrected Ca okay.  Phos elevated.  Continue binders and VDRA. Vascular access - TDC in place Abdominal pain: CT scan without acute concern.  Consider constipation given opioid use.  Management per primary team  Salome Holmes, NP [facility] 12/19/2022,10:48 AM  LOS: 5 days

## 2022-12-19 NOTE — Evaluation (Signed)
Occupational Therapy Evaluation Patient Details Name: Deborah [name] MRN: [id number] DOB: [birth year] Today's Date: 12/19/2022   History of Present Illness The pt is a 57 yo female presenting 10/8 with weakness, diarrhea x4 days, nausea, vomiting, and AMS over last month. Work up revealed Hgb of 5.8, undergoing continued work up for GIB. PMH includes: prior cancer of duodenum, ESRD, GERD, obesity, diabetes, and chronic sacral wounds.   Clinical Impression   Limited eval secondary to cognition and pt needing to go to emergent MRI.  Pt not oriented to place, time, or situation.  Kept eyes open with fixed gaze at times but would demonstrate echolalia with questions asked by therapist.  Min assist for rolling in the bed but unable to follow any commands for MMT.  [name] present and reports that pt's spouse transports her to dialysis via wheelchair and at home pt mostly stays in the bed with assist from an aide 5 days a week for bathing, dressing, and home management tasks.  Feel pt will benefit from trial OT to help progress transfer status, independence with basic selfcare tasks, and cognition.  Recommend patient will benefit from continued inpatient follow up therapy, <3 hours/day post acute care stay.            If plan is discharge home, recommend the following: A lot of help with bathing/dressing/bathroom;Assistance with cooking/housework;A lot of help with walking and/or transfers    Functional Status Assessment  Patient has had a recent decline in their functional status and demonstrates the ability to make significant improvements in function in a reasonable and predictable amount of time.  Equipment Recommendations  Other (comment) (TBD next venue of care)       Precautions / Restrictions Precautions Precautions: Fall Precaution Comments: sacral wounds, bilateral heel wounds Restrictions Weight Bearing Restrictions: Yes RLE Weight Bearing: Touchdown weight bearing Other  Position/Activity Restrictions: from last admission last month, "Per Dr Lajoyce Corners, ideally will be NWB on rt heel, but put as little weight as possible on rt foot'      Mobility Bed Mobility Overal bed mobility: Needs Assistance Bed Mobility: Rolling Rolling: Min assist              Transfers                              ADL either performed or assessed with clinical judgement   ADL Overall ADL's : Needs assistance/impaired                     Lower Body Dressing: Bed level;Total assistance Lower Body Dressing Details (indicate cue type and reason): total assist for gripper socks               General ADL Comments: Limited eval secondary to patient needing to go for emergent MRI.  Pt rolled in the bed with min assist to the right side and then back to the middle with min assist.  She continued to scratch at a wound on her buttocks that was open.  Therapist notified nursing about wound and she stated she would cover it when patient came back from MRI.  Talked with pt's mother during session and she reports that pt stays in bed most of the time at home and doesn't get up.  They have been putting a bed pan underneath her recently for toileting.     Vision Baseline Vision/History: 0 No visual deficits Ability to See  in Adequate Light: 0 Adequate Additional Comments: Vision not assessed secondary to cognition     Perception Perception: Not tested       Praxis Praxis: Not tested       Pertinent Vitals/Pain Pain Assessment Pain Assessment: Faces Faces Pain Scale: Hurts a little bit Pain Location: right foot Pain Descriptors / Indicators: Grimacing, Discomfort Pain Intervention(s): Limited activity within patient's tolerance, Monitored during session     Extremity/Trunk Assessment Upper Extremity Assessment Upper Extremity Assessment: Difficult to assess due to impaired cognition (Pt with delayed participation when asked to move her arms or grip  therapist's hand.)   Lower Extremity Assessment Lower Extremity Assessment: Defer to PT evaluation       Communication Communication Communication: Difficulty following commands/understanding;Difficulty communicating thoughts/reduced clarity of speech Following commands: Follows one step commands inconsistently Cueing Techniques: Verbal cues;Tactile cues   Cognition Arousal: Alert Behavior During Therapy: Flat affect Overall Cognitive Status: Impaired/Different from baseline Area of Impairment: Orientation, Attention, Following commands, Safety/judgement, Problem solving, Awareness                 Orientation Level: Place, Time, Situation Current Attention Level: Focused   Following Commands: Follows one step commands inconsistently Safety/Judgement: Decreased awareness of safety, Decreased awareness of deficits Awareness: Intellectual Problem Solving: Slow processing, Decreased initiation, Difficulty sequencing, Requires verbal cues, Requires tactile cues General Comments: Pt with echolalia repeating words therapist said instead of asking question with blank stare.                Home Living Family/patient expects to be discharged to:: Private residence Living Arrangements: Spouse/significant other (spouse works) Available Help at Discharge: Personal care attendant;Available PRN/intermittently (Has aide 5 days a week that comes and assists with bathing) Type of Home: Apartment       Home Layout: One level     Bathroom Shower/Tub: Chief Strategy Officer: Standard Bathroom Accessibility: Yes   Home Equipment: BSC/3in1;Shower seat;Wheelchair - manual          Prior Functioning/Environment Prior Level of Function : Needs assist             Mobility Comments: pivoting to wheelchair; propelling w/c; states husband bumps her up/down (in wheelchair) the single step at entry ADLs Comments: Has assist from aides for bathing and dressing         OT Problem List: Decreased strength;Impaired balance (sitting and/or standing);Decreased cognition;Pain;Decreased safety awareness;Obesity;Decreased knowledge of use of DME or AE;Decreased activity tolerance      OT Treatment/Interventions: Self-care/ADL training;Balance training;Therapeutic exercise;Therapeutic activities;Patient/family education;DME and/or AE instruction    OT Goals(Current goals can be found in the care plan section) Acute Rehab OT Goals Patient Stated Goal: Pt's mom wants her to get more assistance at home. OT Goal Formulation: With family Time For Goal Achievement: 01/02/23 Potential to Achieve Goals: Fair  OT Frequency: Min 1X/week       AM-PAC OT "6 Clicks" Daily Activity     Outcome Measure Help from another person eating meals?: A Little Help from another person taking care of personal grooming?: A Lot Help from another person toileting, which includes using toliet, bedpan, or urinal?: Total Help from another person bathing (including washing, rinsing, drying)?: Total Help from another person to put on and taking off regular upper body clothing?: A Lot Help from another person to put on and taking off regular lower body clothing?: Total 6 Click Score: 10   End of Session Nurse Communication: Other (comment) (pt with wound on  her buttocks)  Activity Tolerance: Patient limited by lethargy Patient left: in bed;with call bell/phone within reach;with family/visitor present  OT Visit Diagnosis: Unsteadiness on feet (R26.81);Other abnormalities of gait and mobility (R26.89);Muscle weakness (generalized) (M62.81);Other symptoms and signs involving cognitive function;Pain Pain - Right/Left: Right Pain - part of body: Ankle and joints of foot                Time: 6962-9528 OT Time Calculation (min): 24 min Charges:  OT General Charges $OT Visit: 1 Visit OT Evaluation $OT Eval High Complexity: 1 High OT Treatments $Self Care/Home Management : 8-22 mins Perrin Maltese, OTR/L Acute Rehabilitation Services  Office [phone] 12/19/2022

## 2022-12-19 NOTE — Progress Notes (Signed)
PROGRESS NOTE  Deborah [name] ZDG:[id number] DOB: 12/03/1965 DOA: 12/14/2022 PCP: Pcp, No   LOS: 5 days   Brief Narrative / Interim history: 57 y.o. female with hx of ESRD on M/W/F HD, PAF on Eliquis, HFpEF, HTN, DM-2, malignant carcinoid, NAFLD, morbid obesity, chronic ulceration R ankle, recent admission 9/16 -24 initially suspected sepsis from SSTI but ultimately sepsis ruled out, and antibiotics were stopped. That admission underwent angiogram which showed adequate perfusion of the R foot. Treated for gout involving R ankle. Returns due to initial complaint of abd pain, per ED note 1 day history persistent abd pain. Had also missed last HD session, last session on 10/4. Found to have anemia, with Hb of 5.8. On further history, per RN report patients mother provided collateral of 1 week of bright red blood per rectum   Subjective / 24h Interval events: Feels well.  Notes that she is in the hospital, knows the year, seems to be alert and oriented x 4 today  Assesement and Plan: Principal problem Acute on chronic anemia, concern for acute blood loss anemia due to lower GI bleed -gastroenterology consulted, appreciate input.  Transfuse as needed to keep hemoglobin above 7. -Continue PPI, patient has not had any further bowel movement since being here. -Hemoglobin has overall remained stable and clinically she is without bleeding -Gastroenterology consulted, no overt bleeding here.  Globin has remained stable.  Resume Eliquis and closely monitor  Active problems ESRD -nephrology consulted, appreciate input.  Underwent HD 10/11, next HD tomorrow  A-fib-with her not having any further bleeding and hemoglobin remained stable, resume Eliquis today  Acute toxic encephalopathy, possibly due to opiate medication -monitor, decrease home Dilaudid dose.  MRI negative.  She is alert and oriented x 4 this morning  Chronic leukocytosis-recently on steroids for gout.  She is afebrile.  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  Right foot MRI done just 2 weeks ago without evidence of osteomyelitis  Chronic back pain, spinal stenosis, lumbar region with neurogenic claudication -now wheelchair-bound for several months, underwent an MRI of the lumbar spine earlier this year. It showed lower lumbar spondylosis worsening since 2019, worst at L4-5, with facet arthropathy with joint effusion and periarticular edema and unchanged severe right neuroforaminal narrowing at L5-S1.  Neurosurgery evaluated patient March 2024 and did not recommend surgical approach at this point but continued PT, outpatient follow-up -PT consult today  Scheduled Meds:  allopurinol  100 mg Oral BID   atorvastatin  10 mg Oral QHS   busPIRone  5 mg Oral BID   DULoxetine  40 mg Oral Daily   ferric citrate  420 mg Oral TID WC   folic acid  1 mg Oral Daily   Gerhardt's butt cream   Topical BID   insulin aspart  0-6 Units Subcutaneous TID WC   insulin aspart  2 Units Subcutaneous TID WC   insulin glargine-yfgn  15 Units Subcutaneous QHS   leptospermum manuka honey  1 Application Topical Daily   midodrine  5 mg Oral TID WC   mirtazapine  7.5 mg Oral QHS   pantoprazole  40 mg Oral BID AC   Continuous Infusions:    PRN Meds:.acetaminophen, albuterol, HYDROmorphone,  ondansetron (ZOFRAN) IV  Current Outpatient Medications  Medication Instructions   albuterol (PROVENTIL) 2.5 mg, Nebulization, Every 6 hours PRN   allopurinol (ZYLOPRIM) 100 mg, Oral, 2 times daily   apixaban (ELIQUIS) 5 mg, Oral, 2 times daily   atorvastatin (LIPITOR) 10 mg, Oral, Daily    busPIRone (BUSPAR) 5 mg, Oral, 2 times daily   carvedilol (COREG) 12.5 mg, Oral, 2 times daily   cetirizine (ZYRTEC) 10 mg, Oral, Daily   colchicine 0.3 mg, Oral, 2 times weekly   doxercalciferol (HECTOROL) 4 mcg, Intravenous, Every M-W-F (Hemodialysis)   EASY COMFORT PEN NEEDLES 31G X 5 MM MISC USE 3 TIMES A DAY FOR INSULIN ADMINISTRATION   famotidine (PEPCID) 20 mg, Oral, Daily   ferric citrate (AURYXIA) 420 mg, Oral, 3 times daily with meals   fluticasone (FLONASE) 50 MCG/ACT nasal spray 2 sprays, Each Nare, Daily PRN   gabapentin (NEURONTIN) 100 mg, Oral, 3 times daily   hydrALAZINE (APRESOLINE) 50 mg, Oral, 3 times daily   HYDROmorphone (DILAUDID) 4 mg, Oral, 3 times daily PRN, Three times a day as needed 20 days out of the month.   insulin lispro (HUMALOG) 100 UNIT/ML KwikPen Before each meal 3 times a day, 140-199 - 2 units, 200-250 - 6 units, 251-299 - 8 units,  300-349 - 12 units,  350 or above 14 units.   isosorbide mononitrate (IMDUR) 60 mg, Oral, Daily   midodrine (PROAMATINE) 5 mg, Oral, 3 times daily with meals   mirtazapine (REMERON) 15 mg, Oral, Daily at bedtime   pantoprazole (PROTONIX) 40 mg, Oral, 2 times daily   spironolactone (ALDACTONE) 12.5 mg, Oral, Daily   torsemide (DEMADEX) 50 mg, Oral, 2 times daily    Diet Orders (From admission, onward)     Start     Ordered   12/14/22 2037  Diet renal with fluid restriction Fluid restriction: 1200 mL Fluid; Room service appropriate? Yes; Fluid consistency: Thin  Diet effective now       Question Answer Comment  Fluid restriction: 1200 mL Fluid   Room service appropriate? Yes   Fluid consistency: Thin      12/14/22 2039            DVT prophylaxis: SCDs Start: 12/14/22 2037   Lab Results  Component Value Date   PLT 439 (H) 12/18/2022      Code Status: Full Code  Family Communication: no family at bedside, called mother and left message  Status is: Inpatient Remains inpatient appropriate because: watch  for gi bleed  Level of care: Telemetry Medical  Consultants:  Nephrology GI  Objective: Vitals:   12/18/22 1954 12/19/22 0500 12/19/22 0533 12/19/22 0802  BP: (!) 156/93  137/81 135/75  Pulse: 85  85 81  Resp: 20  (!) 22 17  Temp: (!) 97.5 F (36.4 C)  97.7 F (36.5 C) 98.2 F (36.8 C)  TempSrc: Oral  Oral   SpO2: 100%  99% 100%  Weight:  128.3 kg    Height:        Intake/Output Summary (Last 24 hours) at 12/19/2022 1105 Last data filed at 12/19/2022 0000 Gross per 24 hour  Intake 120 ml  Output --  Net 120 ml   Wt Readings from Last 3 Encounters:  12/19/22 128.3 kg  11/28/22 127.5 kg  10/19/22 132.9 kg    Examination:  Constitutional: NAD Eyes: lids and conjunctivae normal, no scleral icterus ENMT: mmm Neck: normal, supple Respiratory: clear to auscultation bilaterally, no wheezing, no crackles. Normal  respiratory effort.  Cardiovascular: Regular rate and rhythm, no murmurs / rubs / gallops. No LE edema. Abdomen: soft, no distention, no tenderness. Bowel sounds positive.    Data Reviewed: I have independently reviewed following labs and imaging studies   CBC Recent Labs  Lab 12/14/22 1700 12/15/22 0535 12/17/22 0848 12/18/22 1746  WBC 21.0* 24.1* 22.4* 23.9*  HGB 5.8* 7.0* 7.2* 7.8*  HCT 19.5* 23.2* 23.9* 25.9*  PLT 285 319 446* 439*  MCV 92.9 93.5 93.7 95.6  MCH 27.6 28.2 28.2 28.8  MCHC 29.7* 30.2 30.1 30.1  RDW 18.3* 17.7* 17.8* 17.3*  LYMPHSABS 1.0 1.4  --   --   MONOABS 0.6 0.7  --   --   EOSABS 0.1 0.2  --   --   BASOSABS 0.1 0.1  --   --     Recent Labs  Lab 12/14/22 1700 12/15/22 0157 12/15/22 0535 12/17/22 0848  NA 132*  --  136 136  K 4.0  --  4.1 4.2  CL 105  --  106 99  CO2 15*  --  16* 19*  GLUCOSE 284*  --  160* 94  BUN 72*  --  76* 45*  CREATININE 6.87*  --  6.74* 5.98*  CALCIUM 7.0*  --  7.5* 7.9*  AST 14*  --   --  8*  ALT 14  --   --  13  ALKPHOS 81  --   --  97  BILITOT 0.3  --   --  0.8  ALBUMIN 1.5*  --    --  1.8*  MG  --   --  1.7 1.8  INR  --  1.9*  --   --   BNP 368.9*  --   --   --     ------------------------------------------------------------------------------------------------------------------ No results for input(s): "CHOL", "HDL", "LDLCALC", "TRIG", "CHOLHDL", "LDLDIRECT" in the last 72 hours.  Lab Results  Component Value Date   HGBA1C 8.1 (H) 06/19/2022   ------------------------------------------------------------------------------------------------------------------ No results for input(s): "TSH", "T4TOTAL", "T3FREE", "THYROIDAB" in the last 72 hours.  Invalid input(s): "FREET3"  Cardiac Enzymes No results for input(s): "CKMB", "TROPONINI", "MYOGLOBIN" in the last 168 hours.  Invalid input(s): "CK" ------------------------------------------------------------------------------------------------------------------    Component Value Date/Time   BNP 368.9 (H) 12/14/2022 1700    CBG: Recent Labs  Lab 12/18/22 0732 12/18/22 1143 12/18/22 1717 12/18/22 1956 12/19/22 0801  GLUCAP 99 99 116* 115* 90    No results found for this or any previous visit (from the past 240 hour(s)).   Radiology Studies: No results found.   Pamella Pert, MD, PhD [facility]  Between 7 am - 7 pm I am available, please contact me via Amion (for emergencies) or Securechat (non urgent messages)  Between 7 pm - 7 am I am not available, please contact night coverage MD/APP via Amion

## 2022-12-20 DIAGNOSIS — D649 Anemia, unspecified: Secondary | ICD-10-CM | POA: Diagnosis not present

## 2022-12-20 LAB — CBC WITH DIFFERENTIAL/PLATELET
Abs Immature Granulocytes: 0.25 10*3/uL — ABNORMAL HIGH (ref 0.00–0.07)
Basophils Absolute: 0.1 10*3/uL (ref 0.0–0.1)
Basophils Relative: 0 %
Eosinophils Absolute: 0.4 10*3/uL (ref 0.0–0.5)
Eosinophils Relative: 2 %
HCT: 23.8 % — ABNORMAL LOW (ref 36.0–46.0)
Hemoglobin: 6.9 g/dL — CL (ref 12.0–15.0)
Immature Granulocytes: 1 %
Lymphocytes Relative: 8 %
Lymphs Abs: 1.8 10*3/uL (ref 0.7–4.0)
MCH: 27.7 pg (ref 26.0–34.0)
MCHC: 29 g/dL — ABNORMAL LOW (ref 30.0–36.0)
MCV: 95.6 fL (ref 80.0–100.0)
Monocytes Absolute: 0.9 10*3/uL (ref 0.1–1.0)
Monocytes Relative: 4 %
Neutro Abs: 18.3 10*3/uL — ABNORMAL HIGH (ref 1.7–7.7)
Neutrophils Relative %: 85 %
Platelets: 448 10*3/uL — ABNORMAL HIGH (ref 150–400)
RBC: 2.49 MIL/uL — ABNORMAL LOW (ref 3.87–5.11)
RDW: 17.1 % — ABNORMAL HIGH (ref 11.5–15.5)
WBC: 21.7 10*3/uL — ABNORMAL HIGH (ref 4.0–10.5)
nRBC: 0 % (ref 0.0–0.2)

## 2022-12-20 LAB — RENAL FUNCTION PANEL
Albumin: 1.8 g/dL — ABNORMAL LOW (ref 3.5–5.0)
Anion gap: 17 — ABNORMAL HIGH (ref 5–15)
BUN: 34 mg/dL — ABNORMAL HIGH (ref 6–20)
CO2: 23 mmol/L (ref 22–32)
Calcium: 8.1 mg/dL — ABNORMAL LOW (ref 8.9–10.3)
Chloride: 97 mmol/L — ABNORMAL LOW (ref 98–111)
Creatinine, Ser: 5.58 mg/dL — ABNORMAL HIGH (ref 0.44–1.00)
GFR, Estimated: 8 mL/min — ABNORMAL LOW (ref >60)
Glucose, Bld: 61 mg/dL — ABNORMAL LOW (ref 70–99)
Phosphorus: 5.7 mg/dL — ABNORMAL HIGH (ref 2.5–4.6)
Potassium: 3.5 mmol/L (ref 3.5–5.1)
Sodium: 137 mmol/L (ref 135–145)

## 2022-12-20 LAB — GLUCOSE, CAPILLARY
Glucose-Capillary: 61 mg/dL — ABNORMAL LOW (ref 70–99)
Glucose-Capillary: 71 mg/dL (ref 70–99)
Glucose-Capillary: 63 mg/dL — ABNORMAL LOW (ref 70–99)
Glucose-Capillary: 107 mg/dL — ABNORMAL HIGH (ref 70–99)
Glucose-Capillary: 152 mg/dL — ABNORMAL HIGH (ref 70–99)

## 2022-12-20 LAB — PREPARE RBC (CROSSMATCH)

## 2022-12-20 LAB — OCCULT BLOOD X 1 CARD TO LAB, STOOL: Fecal Occult Bld: NEGATIVE

## 2022-12-20 LAB — MAGNESIUM: Magnesium: 1.5 mg/dL — ABNORMAL LOW (ref 1.7–2.4)

## 2022-12-20 LAB — ALKALINE PHOSPHATASE: Alkaline Phosphatase: 87 U/L (ref 38–126)

## 2022-12-20 LAB — PROTEIN, TOTAL: Total Protein: 6 g/dL — ABNORMAL LOW (ref 6.5–8.1)

## 2022-12-20 LAB — AST: AST: 7 U/L — ABNORMAL LOW (ref 15–41)

## 2022-12-20 MED ORDER — INSULIN GLARGINE-YFGN 100 UNIT/ML ~~LOC~~ SOLN
8.0000 [IU] | Freq: Every day | SUBCUTANEOUS | Status: DC
  Administered 2022-12-20 – 2023-01-09 (×21): 8 [IU] via SUBCUTANEOUS
  Filled 2022-12-20 (×25): qty 0.08

## 2022-12-20 MED ORDER — HYDROMORPHONE HCL 2 MG PO TABS
2.0000 mg | ORAL_TABLET | Freq: Once | ORAL | Status: AC
  Administered 2022-12-20: 2 mg via ORAL
  Filled 2022-12-20: qty 1

## 2022-12-20 MED ORDER — SODIUM CHLORIDE 0.9% IV SOLUTION: Freq: Once | INTRAVENOUS | Status: AC

## 2022-12-20 MED ORDER — HALOPERIDOL LACTATE 5 MG/ML IJ SOLN
2.0000 mg | Freq: Four times a day (QID) | INTRAMUSCULAR | Status: DC | PRN
  Administered 2022-12-20 – 2022-12-29 (×8): 2 mg via INTRAVENOUS
  Filled 2022-12-20 (×3): qty 0.4
  Filled 2022-12-20 (×4): qty 1
  Filled 2022-12-20: qty 0.4
  Filled 2022-12-20: qty 1

## 2022-12-20 MED ORDER — MAGNESIUM SULFATE 2 GM/50ML IV SOLN
2.0000 g | Freq: Once | INTRAVENOUS | Status: AC
  Administered 2022-12-20: 2 g via INTRAVENOUS
  Filled 2022-12-20: qty 50

## 2022-12-20 NOTE — Progress Notes (Signed)
[facility] Progress Note   Subjective:    Seen and examined at bedside.  No new complaints from her.  Hb down, eliquis stopped and GI reconsulted.  HD today.   Objective Vitals:   12/19/22 2029 12/20/22 0303 12/20/22 0305 12/20/22 0828  BP: (!) 136/99 (!) 141/68  (!) 143/69  Pulse: 83 85  83  Resp: (!) 22 (!) 22  17  Temp: 97.9 F (36.6 C) 97.7 F (36.5 C)    TempSrc: Oral Oral    SpO2: 97% 97%  98%  Weight:   127.8 kg   Height:       Physical Exam General:chronically ill appearing female in NAD, obese, AAOx1  Heart:RRR, no mrg Lungs:CTAB, nml WOB on RA Abdomen:soft, NTND Extremities: trace bilateral pedal edema, large malodorous wound on R heel w/drainage Dialysis Access: [facility]   Filed Weights   12/18/22 0450 12/19/22 0500 12/20/22 0305  Weight: 128.1 kg 128.3 kg 127.8 kg    Intake/Output Summary (Last 24 hours) at 12/20/2022 0848 Last data filed at 12/20/2022 0600 Gross per 24 hour  Intake 0.5 ml  Output --  Net 0.5 ml    Additional Objective Labs: Basic Metabolic Panel: Recent Labs  Lab 12/15/22 0535 12/17/22 0848 12/20/22 0259  NA 136 136 137  K 4.1 4.2 3.5  CL 106 99 97*  CO2 16* 19* 23  GLUCOSE 160* 94 61*  BUN 76* 45* 34*  CREATININE 6.74* 5.98* 5.58*  CALCIUM 7.5* 7.9* 8.1*  PHOS 8.2*  --  5.7*   Liver Function Tests: Recent Labs  Lab 12/14/22 1700 12/17/22 0848 12/20/22 0259  AST 14* 8* 7*  ALT 14 13  --   ALKPHOS 81 97 87  BILITOT 0.3 0.8  --   PROT 5.0* 6.0* 6.0*  ALBUMIN 1.5* 1.8* 1.8*   Recent Labs  Lab 12/14/22 1700  LIPASE 61*   CBC: Recent Labs  Lab 12/14/22 1700 12/15/22 0535 12/17/22 0848 12/18/22 1746 12/20/22 0259  WBC 21.0* 24.1* 22.4* 23.9* 21.7*  NEUTROABS 19.1* 21.3*  --   --  18.3*  HGB 5.8* 7.0* 7.2* 7.8* 6.9*  HCT 19.5* 23.2* 23.9* 25.9* 23.8*  MCV 92.9 93.5 93.7 95.6 95.6  PLT 285 319 446* 439* 448*   Blood Culture    Component Value Date/Time   SDES BLOOD SITE NOT SPECIFIED  11/23/2022 0023   SPECREQUEST  11/23/2022 0023    BOTTLES DRAWN AEROBIC AND ANAEROBIC Blood Culture adequate volume   CULT  11/23/2022 0023    NO GROWTH 5 DAYS Performed at [facility], [address] [place], [place] [address]    REPTSTATUS 11/28/2022 FINAL 11/23/2022 0023    Cardiac Enzymes: No results for input(s): "CKTOTAL", "CKMB", "CKMBINDEX", "TROPONINI" in the last 168 hours. CBG: Recent Labs  Lab 12/19/22 0801 12/19/22 1151 12/19/22 1819 12/19/22 2031 12/20/22 0829  GLUCAP 90 82 76 78 61*   Iron Studies: No results for input(s): "IRON", "TIBC", "TRANSFERRIN", "FERRITIN" in the last 72 hours. Lab Results  Component Value Date   INR 1.9 (H) 12/15/2022   INR 1.8 (H) 11/23/2022   INR 1.1 10/08/2020   Studies/Results: No results found.  Medications:   sodium chloride   Intravenous Once   sodium chloride   Intravenous Once   allopurinol  100 mg Oral BID   atorvastatin  10 mg Oral QHS   busPIRone  5 mg Oral BID   DULoxetine  40 mg Oral Daily   ferric citrate  420  mg Oral TID WC   folic acid  1 mg Oral Daily   Gerhardt's butt cream   Topical BID   insulin aspart  0-6 Units Subcutaneous TID WC   insulin aspart  2 Units Subcutaneous TID WC   insulin glargine-yfgn  15 Units Subcutaneous QHS   leptospermum manuka honey  1 Application Topical Daily   midodrine  5 mg Oral TID WC   mirtazapine  7.5 mg Oral QHS   pantoprazole  40 mg Oral BID AC    Dialysis Orders: [facility]   F180   400/800   3K/2.5 bath  126.9kg   RDC   - mircera 225 mcg last 10/5 - hectorol 7 mcg   Assessment/Plan: ESRD: under dialyzed as of late given missed and shorten treatments.  Continue MWF schedule w HD today. Chronic Wounds - on R heel and LE. Drainage coming from heel. Wound care following.  AMS - MRI head completed with no acute changes. Per PMD Volume - does not look volume overloaded. UF as tolerated.  Hypertension - BP in goal.  Cont home antiHTN regimen. Acute on  Chronic Anemia esrd - Hemoglobin 5.8 on admit.  Has been receiving transfusion PRN - hb 6.9 this AM after resuming eliquis which was stopped today.  ESA due 10/19.  No iron d/t high ferritin. Transfusions per primary team - ordered for today.  GI consulting. CT abd/pelvis with no acute findings.  Secondary Hyperparathyroidism/Hyperphosphatemia - Corrected Ca okay.  Phos elevated.  Continue binders and VDRA. Vascular access - TDC in place Abdominal pain: CT scan without acute concern.  Consider constipation given opioid use.  Management per primary team  Estill Bakes MD [facility] Pager [phone]

## 2022-12-20 NOTE — Plan of Care (Signed)
  Problem: Education: Goal: Understanding of CV disease, CV risk reduction, and recovery process will improve Outcome: Progressing Goal: Individualized Educational Video(s) Outcome: Progressing   Problem: Activity: Goal: Ability to return to baseline activity level will improve Outcome: Progressing   Problem: Cardiovascular: Goal: Ability to achieve and maintain adequate cardiovascular perfusion will improve Outcome: Progressing Goal: Vascular access site(s) Level 0-1 will be maintained Outcome: Progressing   Problem: Health Behavior/Discharge Planning: Goal: Ability to safely manage health-related needs after discharge will improve Outcome: Progressing   Problem: Education: Goal: Ability to describe self-care measures that may prevent or decrease complications (Diabetes Survival Skills Education) will improve Outcome: Progressing Goal: Individualized Educational Video(s) Outcome: Progressing   Problem: Coping: Goal: Ability to adjust to condition or change in health will improve Outcome: Progressing   Problem: Fluid Volume: Goal: Ability to maintain a balanced intake and output will improve Outcome: Progressing   Problem: Health Behavior/Discharge Planning: Goal: Ability to identify and utilize available resources and services will improve Outcome: Progressing Goal: Ability to manage health-related needs will improve Outcome: Progressing   Problem: Metabolic: Goal: Ability to maintain appropriate glucose levels will improve Outcome: Progressing   Problem: Nutritional: Goal: Maintenance of adequate nutrition will improve Outcome: Progressing Goal: Progress toward achieving an optimal weight will improve Outcome: Progressing   Problem: Skin Integrity: Goal: Risk for impaired skin integrity will decrease Outcome: Progressing   Problem: Tissue Perfusion: Goal: Adequacy of tissue perfusion will improve Outcome: Progressing   Problem: Education: Goal: Knowledge  of General Education information will improve Description: Including pain rating scale, medication(s)/side effects and non-pharmacologic comfort measures Outcome: Progressing   Problem: Health Behavior/Discharge Planning: Goal: Ability to manage health-related needs will improve Outcome: Progressing   Problem: Clinical Measurements: Goal: Ability to maintain clinical measurements within normal limits will improve Outcome: Progressing Goal: Will remain free from infection Outcome: Progressing Goal: Diagnostic test results will improve Outcome: Progressing Goal: Respiratory complications will improve Outcome: Progressing Goal: Cardiovascular complication will be avoided Outcome: Progressing   Problem: Activity: Goal: Risk for activity intolerance will decrease Outcome: Progressing   Problem: Nutrition: Goal: Adequate nutrition will be maintained Outcome: Progressing   Problem: Coping: Goal: Level of anxiety will decrease Outcome: Progressing   Problem: Elimination: Goal: Will not experience complications related to bowel motility Outcome: Progressing Goal: Will not experience complications related to urinary retention Outcome: Progressing   Problem: Pain Managment: Goal: General experience of comfort will improve Outcome: Progressing   Problem: Safety: Goal: Ability to remain free from injury will improve Outcome: Progressing   Problem: Skin Integrity: Goal: Risk for impaired skin integrity will decrease Outcome: Progressing

## 2022-12-20 NOTE — Inpatient Diabetes Management (Signed)
Inpatient Diabetes Program Recommendations  AACE/ADA: New Consensus Statement on Inpatient Glycemic Control (2015)  Target Ranges:  Prepandial:   less than 140 mg/dL      Peak postprandial:   less than 180 mg/dL (1-2 hours)      Critically ill patients:  140 - 180 mg/dL   Lab Results  Component Value Date   GLUCAP 71 12/20/2022   HGBA1C 8.1 (H) 06/19/2022    Review of Glycemic Control  Latest Reference Range & Units 12/19/22 18:19 12/19/22 20:31 12/20/22 08:29 12/20/22 09:37  Glucose-Capillary 70 - 99 mg/dL 76 78 61 (L) 71  (L): Data is abnormally low Diabetes history: Type 2 DM Outpatient Diabetes medications: Humalog 0-14 units TID Current orders for Inpatient glycemic control: Novolog 0-6 units TID, Novolog 2 units TID, Semglee 15 units QHS  Inpatient Diabetes Program Recommendations:    Noted mild lows. Consider reducing Semglee to 8 units at bedtime.   Thanks, Lujean Rave, MSN, RNC-OB Diabetes Coordinator [phone] (8a-5p)

## 2022-12-20 NOTE — Progress Notes (Addendum)
Attending physician's note   I have taken a history, reviewed the chart, and examined the patient. I performed a substantive portion of this encounter, including complete performance of at least one of the key components, in conjunction with the APP. I agree with the APP's note, impression, and recommendations with my edits.   Inpatient GI service reconsulted on this 57 year old female with medical history as outlined below for input on 1 g decline and hemoglobin in the setting of restarting Eliquis.  No overt bleeding.  I discussed the role/utility of EGD/colonoscopy with the patient today for diagnostic and therapeutic intent.  I also discussed with her mother, Breckyn Troyer, and her niece/DPOA, Elani Delph, by phone who are in agreement with proceeding with EGD/colonoscopy this week.  - CLD - Start bowel prep tomorrow morning and slowly prep throughout the day with plan for EGD/colonoscopy on Wednesday +/- VCE if unrevealing - Continue serial CBC checks with blood products as needed per protocol - Hold Eliquis with 2-day washout - Scheduled Reglan tomorrow as part of her prep plan given history of gastroparesis and chronic pain medication  Doristine Locks, DO, FACG [phone] office          Re-Consult Note Primary GI is Digestive health   Subjective  Chief Complaint: Abdominal pain with lower GI bleed on Eliquis  12/15/2022 patient consulted by our service for abdominal pain with possible lower GI bleeding reported hematochezia on Eliquis with diarrhea.  Initial CTAP without contrast with no acute findings or changes.  Initial exam with rather diffusely tender pain throughout the abdomen, noted extensive pressure ulcers and some oozing.  At initial presentation had run out of Dilaudid because she was taking more than prescribed which is thought to be the reason for increasing abdominal pain.  Diarrhea had been new over the past month with a recent hospital station in  September with concerns for osteomyelitis had been on antibiotics.  C. difficile was ordered.  Also on Colchicine and Allopurinol at the time.  Noted acute on chronic anemia, iron studies consistent with anemia of chronic disease with ESRD on dialysis.  No recent history of hematochezia or melena.  Again weeping pressure ulcers.  Did discuss history of duodenal carcinoma status post endoscopic resection in 2021 with recent EGD 2022 with grade a esophagitis and significant retained food consistent with underlying gastroparesis.  Called back today given acute drop in hemoglobin after restarting Eliquis.  Patient being transfused PRBCs today and Eliquis back on hold.     Today, again it is noted the patient is a poor historian.  She continues to describe generalized abdominal pain, not sure when her last bowel movement was but does not think she is seeing any bleeding or black stool.  Tells me she can make her own medical decisions.  She is not sure if she would like another EGD/colonoscopy if it is recommended.    Denies fever, chills or weight loss.  GI history: 07/26/2020 EGD with Dr. Dulce Sellar done at [facility] with LA grade a esophagitis, suspected cause of black emesis and a large amount of food residue in the stomach and retained food in the duodenum 07/26/2019 colonoscopy with fair preparation of the colon, 2 to-3 mm polyps in the descending colon, 110 mm polyp in the proximal sigmoid colon internal hemorrhoids EGD/EUS at [facility] in August 2021 where they did biopsies, but they terminated the procedure due to difficulty breathing.  They had her return a week  later on 11/06/2019 for repeat procedure with general anesthesia.  All those records are in Care Everywhere.    Objective   Vital signs in last 24 hours: Temp:  [97.7 F (36.5 C)-98.4 F (36.9 C)] 97.7 F (36.5 C) (10/14 0303) Pulse Rate:  [83-86] 83 (10/14 0828) Resp:  [17-22] 17 (10/14 0828) BP: (126-143)/(68-99)  143/69 (10/14 0828) SpO2:  [97 %-98 %] 98 % (10/14 0828) Weight:  [127.8 kg] 127.8 kg (10/14 0305) Last BM Date : 12/19/22 General:  Chronically ill appearing AA female in NAD Heart:  Regular rate and rhythm; no murmurs Lungs: Respirations even and unlabored, lungs CTA bilaterally Abdomen:  Soft, mild generalized TTP and nondistended. Normal bowel sounds. Psych:  Cooperative.   Intake/Output from previous day: 10/13 0701 - 10/14 0700 In: 0.5 [IV Piggyback:0.5] Out: -    Lab Results: Recent Labs    12/18/22 1746 12/20/22 0259  WBC 23.9* 21.7*  HGB 7.8* 6.9*  HCT 25.9* 23.8*  PLT 439* 448*   BMET Recent Labs    12/20/22 0259  NA 137  K 3.5  CL 97*  CO2 23  GLUCOSE 61*  BUN 34*  CREATININE 5.58*  CALCIUM 8.1*      Latest Ref Rng & Units 12/20/2022    2:59 AM 12/17/2022    8:48 AM 12/14/2022    5:00 PM  Hepatic Function  Total Protein 6.5 - 8.1 g/dL 6.0  6.0  5.0   Albumin 3.5 - 5.0 g/dL 1.8  1.8  1.5   AST 15 - 41 U/L 7  8  14    ALT 0 - 44 U/L  13  14   Alk Phosphatase 38 - 126 U/L 87  97  81   Total Bilirubin 0.3 - 1.2 mg/dL  0.8  0.3       Assessment / Plan:   Assessment: 1.  Acute on chronic anemia: Initial reports of hematochezia, but patient remains stable off of Eliquis, wants to be started though patient had a 1 g drop in hemoglobin overnight, 7.8--> 6.9, no overt signs of GI bleed, last reported EGD in May 2022 with esophagitis and gastroparesis, last reported colonoscopy with fair prep in 2021 with polyps and internal hemorrhoids; consider GI source versus anemia of chronic disease 2.  ESRD on HD 3.  Multiple pressure ulcers with oozing 4.  Diarrhea 5.  History of duodenal carcinoma status post endoscopic resection 2021 6.  Gastroparesis 7.  Spinal stenosis in the lumbar region with neurogenic claudication 8.  PAF on Eliquis-currently back on hold  Plan: 1.  At this point hemoglobin has dropped again.  Will need to reconsider EGD and colonoscopy  giving ongoing nature of anemia.  Will discuss further with Dr. Barron Alvine.  Patient is also not sure she wants to have these procedures done again.  Tentatively will tee up for procedures on Wednesday, 12/22/2022.  I have placed a clear liquid diet order for tomorrow and will place the rest of the orders when we check in with her again tomorrow.  Need to give time for Eliquis washout. 2.  Continue to monitor hemoglobin with transfusion as needed less than 7 3.  Agree with holding Eliquis 4.  Further recommendations to come after discussion with Dr. Barron Alvine.  Thank you for your kind reconsultation.   LOS: 6 days   Unk Lightning  12/20/2022, 9:34 AM

## 2022-12-20 NOTE — Care Management Important Message (Signed)
Important Message  Patient Details  Name: Deborah [name] MRN: [id number] Date of Birth: Jul 01, 1965   Important Message Given:  Yes - Medicare IM     Sherilyn Banker 12/20/2022, 2:05 PM

## 2022-12-20 NOTE — Progress Notes (Signed)
PROGRESS NOTE  Deborah [name] WGN:[id number] DOB: 25-Jan-1966 DOA: 12/14/2022 PCP: Pcp, No   LOS: 6 days   Brief Narrative / Interim history: 57 y.o. female with hx of ESRD on M/W/F HD, PAF on Eliquis, HFpEF, HTN, DM-2, malignant carcinoid, NAFLD, morbid obesity, chronic ulceration R ankle, recent admission 9/16 -24 initially suspected sepsis from SSTI but ultimately sepsis ruled out, and antibiotics were stopped. That admission underwent angiogram which showed adequate perfusion of the R foot. Treated for gout involving R ankle. Returns due to initial complaint of abd pain, per ED note 1 day history persistent abd pain. Had also missed last HD session, last session on 10/4. Found to have anemia, with Hb of 5.8. On further history, per RN report patients mother provided collateral of 1 week of bright red blood per rectum   Subjective / 24h Interval events: Denies any pain.  Has no complaints  Assesement and Plan: Principal problem Acute on chronic anemia, concern for acute blood loss anemia due to lower GI bleed -gastroenterology consulted, appreciate input.  They saw her last week, and without overt bleeding recommended monitoring.  Hemoglobin has remained stable over the weekend, and upon resumption of her Eliquis hemoglobin is dropped again this morning to 6.9 -I will reconsult gastroenterology today.  Transfuse unit of packed red blood cells.  Hold Eliquis  Active problems ESRD -nephrology consulted, appreciate input.  To undergo HD today  A-fib-given new drop in hemoglobin with resumption of her Eliquis, hold Eliquis and GI has been reconsulted as above  Acute toxic encephalopathy, possibly due to opiate medication -monitor, decrease home Dilaudid dose.  MRI negative.  Fluctuating mental status  Chronic leukocytosis-recently on steroids for gout.  She is afebrile.  Will repeat MRI of the foot due to persistent elevation in her WBC, read pending this morning  Metastatic duodenal  carcinoid-had resection in the past  Chronic diastolic CHF-euvolemic, volume management per HD  Chronic pain-continue home Dilaudid at a lower dose  Hyperlipidemia, NAFLD-continue statin  Mood disorder-continue duloxetine, added diazepam  Gout-continue home allopurinol  Chronic wounds-wound care consult.  Right foot MRI done just 2 weeks ago without evidence of osteomyelitis  Chronic back pain, spinal stenosis, lumbar region with neurogenic claudication -now wheelchair-bound for several months, underwent an MRI of the lumbar spine earlier this year. It showed lower lumbar spondylosis worsening since 2019, worst at L4-5, with facet arthropathy with joint effusion and periarticular edema and unchanged severe right neuroforaminal narrowing at L5-S1.  Neurosurgery evaluated patient March 2024 and did not recommend surgical approach at this point but continued PT, outpatient follow-up -PT recommends SNF, family in agreement  Scheduled Meds:  sodium chloride   Intravenous Once   sodium chloride   Intravenous Once   allopurinol  100 mg Oral BID   atorvastatin  10 mg Oral QHS   busPIRone  5 mg Oral BID   DULoxetine  40 mg Oral Daily   ferric citrate  420 mg Oral TID WC   folic acid  1 mg Oral Daily   Gerhardt's butt cream   Topical BID   insulin aspart  0-6 Units Subcutaneous TID WC   insulin aspart  2 Units Subcutaneous TID WC   insulin glargine-yfgn  15 Units Subcutaneous QHS   leptospermum manuka honey  1 Application Topical Daily   midodrine  5 mg Oral TID WC   mirtazapine  7.5 mg Oral QHS   pantoprazole  40 mg Oral BID AC   Continuous Infusions:  PRN Meds:.acetaminophen, albuterol, HYDROmorphone, ondansetron (ZOFRAN) IV  Current Outpatient Medications  Medication Instructions   albuterol (PROVENTIL) 2.5 mg, Nebulization, Every 6 hours PRN   allopurinol (ZYLOPRIM) 100 mg, Oral, 2 times daily   apixaban (ELIQUIS) 5 mg, Oral, 2 times daily   atorvastatin (LIPITOR) 10 mg,  Oral, Daily   busPIRone (BUSPAR) 5 mg, Oral, 2 times daily   carvedilol (COREG) 12.5 mg, Oral, 2 times daily   cetirizine (ZYRTEC) 10 mg, Oral, Daily   colchicine 0.3 mg, Oral, 2 times weekly   doxercalciferol (HECTOROL) 4 mcg, Intravenous, Every M-W-F (Hemodialysis)   EASY COMFORT PEN NEEDLES 31G X 5 MM MISC USE 3 TIMES A DAY FOR INSULIN ADMINISTRATION   famotidine (PEPCID) 20 mg, Oral, Daily   ferric citrate (AURYXIA) 420 mg, Oral, 3 times daily with meals   fluticasone (FLONASE) 50 MCG/ACT nasal spray 2 sprays, Each Nare, Daily PRN   gabapentin (NEURONTIN) 100 mg, Oral, 3 times daily   hydrALAZINE (APRESOLINE) 50 mg, Oral, 3 times daily   HYDROmorphone (DILAUDID) 4 mg, Oral, 3 times daily PRN, Three times a day as needed 20 days out of the month.   insulin lispro (HUMALOG) 100 UNIT/ML KwikPen Before each meal 3 times a day, 140-199 - 2 units, 200-250 - 6 units, 251-299 - 8 units,  300-349 - 12 units,  350 or above 14 units.   isosorbide mononitrate (IMDUR) 60 mg, Oral, Daily   midodrine (PROAMATINE) 5 mg, Oral, 3 times daily with meals   mirtazapine (REMERON) 15 mg, Oral, Daily at bedtime   pantoprazole (PROTONIX) 40 mg, Oral, 2 times daily   spironolactone (ALDACTONE) 12.5 mg, Oral, Daily   torsemide (DEMADEX) 50 mg, Oral, 2 times daily    Diet Orders (From admission, onward)     Start     Ordered   12/14/22 2037  Diet renal with fluid restriction Fluid restriction: 1200 mL Fluid; Room service appropriate? Yes; Fluid consistency: Thin  Diet effective now       Question Answer Comment  Fluid restriction: 1200 mL Fluid   Room service appropriate? Yes   Fluid consistency: Thin      12/14/22 2039            DVT prophylaxis: SCDs Start: 12/14/22 2037   Lab Results  Component Value Date   PLT 448 (H) 12/20/2022      Code Status: Full Code  Family Communication: no family at bedside, called mother and left message  Status is: Inpatient Remains inpatient appropriate  because: watch for gi bleed  Level of care: Telemetry Medical  Consultants:  Nephrology GI  Objective: Vitals:   12/19/22 2029 12/20/22 0303 12/20/22 0305 12/20/22 0828  BP: (!) 136/99 (!) 141/68  (!) 143/69  Pulse: 83 85  83  Resp: (!) 22 (!) 22  17  Temp: 97.9 F (36.6 C) 97.7 F (36.5 C)    TempSrc: Oral Oral    SpO2: 97% 97%  98%  Weight:   127.8 kg   Height:        Intake/Output Summary (Last 24 hours) at 12/20/2022 0851 Last data filed at 12/20/2022 0600 Gross per 24 hour  Intake 0.5 ml  Output --  Net 0.5 ml   Wt Readings from Last 3 Encounters:  12/20/22 127.8 kg  11/28/22 127.5 kg  10/19/22 132.9 kg    Examination:  Constitutional: NAD Eyes: lids and conjunctivae normal, no scleral icterus ENMT: mmm Neck: normal, supple Respiratory: clear to auscultation bilaterally, no wheezing,  no crackles. Normal respiratory effort.  Cardiovascular: Regular rate and rhythm, no murmurs / rubs / gallops. No LE edema. Abdomen: soft, no distention, no tenderness. Bowel sounds positive.     Data Reviewed: I have independently reviewed following labs and imaging studies   CBC Recent Labs  Lab 12/14/22 1700 12/15/22 0535 12/17/22 0848 12/18/22 1746 12/20/22 0259  WBC 21.0* 24.1* 22.4* 23.9* 21.7*  HGB 5.8* 7.0* 7.2* 7.8* 6.9*  HCT 19.5* 23.2* 23.9* 25.9* 23.8*  PLT 285 319 446* 439* 448*  MCV 92.9 93.5 93.7 95.6 95.6  MCH 27.6 28.2 28.2 28.8 27.7  MCHC 29.7* 30.2 30.1 30.1 29.0*  RDW 18.3* 17.7* 17.8* 17.3* 17.1*  LYMPHSABS 1.0 1.4  --   --  1.8  MONOABS 0.6 0.7  --   --  0.9  EOSABS 0.1 0.2  --   --  0.4  BASOSABS 0.1 0.1  --   --  0.1    Recent Labs  Lab 12/14/22 1700 12/15/22 0157 12/15/22 0535 12/17/22 0848 12/20/22 0259  NA 132*  --  136 136 137  K 4.0  --  4.1 4.2 3.5  CL 105  --  106 99 97*  CO2 15*  --  16* 19* 23  GLUCOSE 284*  --  160* 94 61*  BUN 72*  --  76* 45* 34*  CREATININE 6.87*  --  6.74* 5.98* 5.58*  CALCIUM 7.0*  --  7.5*  7.9* 8.1*  AST 14*  --   --  8* 7*  ALT 14  --   --  13  --   ALKPHOS 81  --   --  97 87  BILITOT 0.3  --   --  0.8  --   ALBUMIN 1.5*  --   --  1.8* 1.8*  MG  --   --  1.7 1.8 1.5*  INR  --  1.9*  --   --   --   BNP 368.9*  --   --   --   --     ------------------------------------------------------------------------------------------------------------------ No results for input(s): "CHOL", "HDL", "LDLCALC", "TRIG", "CHOLHDL", "LDLDIRECT" in the last 72 hours.  Lab Results  Component Value Date   HGBA1C 8.1 (H) 06/19/2022   ------------------------------------------------------------------------------------------------------------------ No results for input(s): "TSH", "T4TOTAL", "T3FREE", "THYROIDAB" in the last 72 hours.  Invalid input(s): "FREET3"  Cardiac Enzymes No results for input(s): "CKMB", "TROPONINI", "MYOGLOBIN" in the last 168 hours.  Invalid input(s): "CK" ------------------------------------------------------------------------------------------------------------------    Component Value Date/Time   BNP 368.9 (H) 12/14/2022 1700    CBG: Recent Labs  Lab 12/19/22 0801 12/19/22 1151 12/19/22 1819 12/19/22 2031 12/20/22 0829  GLUCAP 90 82 76 78 61*    No results found for this or any previous visit (from the past 240 hour(s)).   Radiology Studies: No results found.   Pamella Pert, MD, PhD [facility]  Between 7 am - 7 pm I am available, please contact me via Amion (for emergencies) or Securechat (non urgent messages)  Between 7 pm - 7 am I am not available, please contact night coverage MD/APP via Amion

## 2022-12-21 DIAGNOSIS — M86171 Other acute osteomyelitis, right ankle and foot: Secondary | ICD-10-CM

## 2022-12-21 DIAGNOSIS — D649 Anemia, unspecified: Secondary | ICD-10-CM | POA: Diagnosis not present

## 2022-12-21 HISTORY — DX: Other acute osteomyelitis, right ankle and foot: M86.171

## 2022-12-21 LAB — CBC
HCT: 25.8 % — ABNORMAL LOW (ref 36.0–46.0)
Hemoglobin: 7.8 g/dL — ABNORMAL LOW (ref 12.0–15.0)
MCH: 28.6 pg (ref 26.0–34.0)
MCHC: 30.2 g/dL (ref 30.0–36.0)
MCV: 94.5 fL (ref 80.0–100.0)
Platelets: 492 10*3/uL — ABNORMAL HIGH (ref 150–400)
RBC: 2.73 MIL/uL — ABNORMAL LOW (ref 3.87–5.11)
RDW: 17.3 % — ABNORMAL HIGH (ref 11.5–15.5)
WBC: 22.9 10*3/uL — ABNORMAL HIGH (ref 4.0–10.5)
nRBC: 0 % (ref 0.0–0.2)

## 2022-12-21 LAB — GLUCOSE, CAPILLARY
Glucose-Capillary: 98 mg/dL (ref 70–99)
Glucose-Capillary: 99 mg/dL (ref 70–99)
Glucose-Capillary: 86 mg/dL (ref 70–99)

## 2022-12-21 LAB — RENAL FUNCTION PANEL
Albumin: 1.9 g/dL — ABNORMAL LOW (ref 3.5–5.0)
Anion gap: 12 (ref 5–15)
BUN: 36 mg/dL — ABNORMAL HIGH (ref 6–20)
CO2: 26 mmol/L (ref 22–32)
Calcium: 8.3 mg/dL — ABNORMAL LOW (ref 8.9–10.3)
Chloride: 97 mmol/L — ABNORMAL LOW (ref 98–111)
Creatinine, Ser: 5.61 mg/dL — ABNORMAL HIGH (ref 0.44–1.00)
GFR, Estimated: 8 mL/min — ABNORMAL LOW (ref >60)
Glucose, Bld: 108 mg/dL — ABNORMAL HIGH (ref 70–99)
Phosphorus: 5 mg/dL — ABNORMAL HIGH (ref 2.5–4.6)
Potassium: 3.4 mmol/L — ABNORMAL LOW (ref 3.5–5.1)
Sodium: 135 mmol/L (ref 135–145)

## 2022-12-21 LAB — SEDIMENTATION RATE: Sed Rate: 119 mm/h — ABNORMAL HIGH (ref 0–22)

## 2022-12-21 LAB — C-REACTIVE PROTEIN: CRP: 14.8 mg/dL — ABNORMAL HIGH (ref <1.0)

## 2022-12-21 MED ORDER — HEPARIN SODIUM (PORCINE) 1000 UNIT/ML DIALYSIS: 5000.0000 [IU] | INTRAMUSCULAR | Status: DC | PRN

## 2022-12-21 MED ORDER — PENTAFLUOROPROP-TETRAFLUOROETH EX AERO: 1.0000 | INHALATION_SPRAY | CUTANEOUS | Status: DC | PRN

## 2022-12-21 MED ORDER — HYDROMORPHONE HCL 1 MG/ML IJ SOLN
0.5000 mg | Freq: Once | INTRAMUSCULAR | Status: DC | PRN
  Filled 2022-12-21: qty 0.5

## 2022-12-21 MED ORDER — HEPARIN SODIUM (PORCINE) 1000 UNIT/ML DIALYSIS
1000.0000 [IU] | INTRAMUSCULAR | Status: DC | PRN
  Administered 2022-12-21: 3800 [IU] via INTRAVENOUS_CENTRAL
  Filled 2022-12-21: qty 1

## 2022-12-21 MED ORDER — PEG-KCL-NACL-NASULF-NA ASC-C 100 G PO SOLR
1.0000 | Freq: Once | ORAL | Status: AC
  Administered 2022-12-21: 200 g via ORAL
  Filled 2022-12-21 (×2): qty 1

## 2022-12-21 MED ORDER — HYDROMORPHONE HCL 1 MG/ML IJ SOLN
0.5000 mg | Freq: Once | INTRAMUSCULAR | Status: AC | PRN
  Administered 2022-12-21: 0.5 mg via INTRAVENOUS
  Filled 2022-12-21: qty 0.5

## 2022-12-21 MED ORDER — SODIUM CHLORIDE 0.9 % IV SOLN: INTRAVENOUS | Status: AC

## 2022-12-21 MED ORDER — ALTEPLASE 2 MG IJ SOLR: 2.0000 mg | Freq: Once | INTRAMUSCULAR | Status: DC | PRN

## 2022-12-21 MED ORDER — LIDOCAINE-PRILOCAINE 2.5-2.5 % EX CREA: 1.0000 | TOPICAL_CREAM | CUTANEOUS | Status: DC | PRN

## 2022-12-21 MED ORDER — LIDOCAINE HCL (PF) 1 % IJ SOLN: 5.0000 mL | INTRAMUSCULAR | Status: DC | PRN

## 2022-12-21 NOTE — TOC Progression Note (Signed)
Transition of Care [facility]) - Progression Note    Patient Details  Name: Deborah [name] MRN: [id number] Date of Birth: 1965/09/07  Transition of Care [facility]) CM/SW Contact  Carley Hammed, LCSW Phone Number: 12/21/2022, 2:13 PM  Clinical Narrative:     TOC continues to follow to assist with disposition and DC plans when closer to medical stability. Please consult for any further needs.        Expected Discharge Plan and Services                                               Social Determinants of Health (SDOH) Interventions SDOH Screenings   Food Insecurity: No Food Insecurity (12/15/2022)  Housing: Patient Declined (12/15/2022)  Transportation Needs: No Transportation Needs (12/15/2022)  Utilities: Not At Risk (12/15/2022)  Depression (PHQ2-9): High Risk (11/18/2022)  Financial Resource Strain: Low Risk (03/23/2021)   Received from [facility])  Physical Activity: Not on File (10/31/2017)   Received from [place], [place]  Social Connections: Unknown (07/19/2021)   Received from [facility]  Stress: Low Risk (03/23/2021)   Received from [facility])  Tobacco Use: Low Risk  (12/14/2022)    Readmission Risk Interventions    11/25/2022    5:16 PM 07/05/2022    1:20 PM  Readmission Risk Prevention Plan  Transportation Screening Complete Complete  Medication Review (RN Care Manager) Complete Referral to Pharmacy  PCP or Specialist appointment within 3-5 days of discharge Complete Complete  HRI or Home Care Consult Complete Complete  SW Recovery Care/Counseling Consult Complete Complete  Palliative Care Screening Not Applicable Not Applicable  Skilled Nursing Facility Not Applicable Not Applicable

## 2022-12-21 NOTE — Consult Note (Signed)
ORTHOPAEDIC CONSULTATION  REQUESTING PHYSICIAN: Leatha Gilding, MD  Chief Complaint: Worsening ulceration right heel.  HPI: Deborah [name] is a 57 y.o. female who presents with necrotic ulcer right heel.  Patient is status post stroke involving the right lower extremity.  Patient has a history of uncontrolled type 2 diabetes and peripheral vascular disease.  Past Medical History:  Diagnosis Date   Acute back pain with sciatica, left    Acute back pain with sciatica, right    AKI (acute kidney injury) (HCC)    Anemia, unspecified    Cancer (HCC)    Carcinoid tumor of duodenum    Chest pain with normal coronary angiography 2019   Chronic kidney disease, stage 3b (HCC)    Chronic pain    Chronic systolic CHF (congestive heart failure) (HCC)    Diabetes mellitus    DKA (diabetic ketoacidosis) (HCC)    Drug-seeking behavior    21 hospitalizations and 14 CT a/p in 2 years for N/V and abdominal pain, demanding only IV dilaudid   Elevated troponin    chronic   Esophageal reflux    Fibromyalgia    Gastric ulcer    Gastroparesis    Gout    Hyperlipidemia    Hypertension    Hypokalemia    Hypomagnesemia    Lumbosacral stenosis    LVH (left ventricular hypertrophy)    Morbid obesity (HCC)    NICM (nonischemic cardiomyopathy) (HCC)    PAF (paroxysmal atrial fibrillation) (HCC)    Stroke (HCC) 02/2011   Thrombocytosis    Vitamin B12 deficiency anemia    Past Surgical History:  Procedure Laterality Date   ABDOMINAL AORTOGRAM W/LOWER EXTREMITY N/A 11/29/2022   Procedure: ABDOMINAL AORTOGRAM W/LOWER EXTREMITY;  Surgeon: Daria Pastures, MD;  Location: [facility];  Service: Cardiovascular;  Laterality: N/A;   AV FISTULA PLACEMENT Left 06/30/2022   Procedure: LEFT BRACHIOCEPHALIC ARTERIOVENOUS (AV) FISTULA CREATION;  Surgeon: Cephus Shelling, MD;  Location: [facility] OR;  Service: Vascular;  Laterality: Left;   BIOPSY  07/27/2019   Procedure: BIOPSY;  Surgeon: Vida Rigger, MD;  Location: [facility];  Service: Endoscopy;;   BIOPSY  07/30/2019   Procedure: BIOPSY;  Surgeon: Kathi Der, MD;  Location: [facility];  Service: Gastroenterology;;   CATARACT EXTRACTION  01/2014   CHOLECYSTECTOMY     COLONOSCOPY WITH PROPOFOL N/A 07/30/2019   Procedure: COLONOSCOPY WITH PROPOFOL;  Surgeon: Kathi Der, MD;  Location: [facility];  Service: Gastroenterology;  Laterality: N/A;   ESOPHAGOGASTRODUODENOSCOPY N/A 07/27/2019   Procedure: ESOPHAGOGASTRODUODENOSCOPY (EGD);  Surgeon: Vida Rigger, MD;  Location: [facility];  Service: Endoscopy;  Laterality: N/A;   ESOPHAGOGASTRODUODENOSCOPY N/A 07/26/2020   Procedure: ESOPHAGOGASTRODUODENOSCOPY (EGD);  Surgeon: Willis Modena, MD;  Location: [facility];  Service: Endoscopy;  Laterality: N/A;   ESOPHAGOGASTRODUODENOSCOPY (EGD) WITH PROPOFOL N/A 08/02/2019   Procedure: ESOPHAGOGASTRODUODENOSCOPY (EGD) WITH PROPOFOL;  Surgeon: Kathi Der, MD;  Location: [facility];  Service: Gastroenterology;  Laterality: N/A;   HEMOSTASIS CLIP PLACEMENT  08/02/2019   Procedure: HEMOSTASIS CLIP PLACEMENT;  Surgeon: Kathi Der, MD;  Location: [facility];  Service: Gastroenterology;;   IR FLUORO GUIDE CV LINE RIGHT  06/24/2022   IR US GUIDE VASC ACCESS RIGHT  06/24/2022   POLYPECTOMY  07/30/2019   Procedure: POLYPECTOMY;  Surgeon: Kathi Der, MD;  Location: [facility];  Service: Gastroenterology;;   POLYPECTOMY  08/02/2019   Procedure: POLYPECTOMY;  Surgeon: Kathi Der, MD;  Location: [facility];  Service: Gastroenterology;;   Social  History   Socioeconomic History   Marital status: Married    Spouse name: Not on file   Number of children: Not on file   Years of education: Not on file   Highest education level: Not on file  Occupational History   Not on file  Tobacco Use   Smoking status: Never   Smokeless tobacco: Never  Vaping Use   Vaping status: Never Used  Substance and Sexual  Activity   Alcohol use: No   Drug use: No   Sexual activity: Not Currently    Birth control/protection: None  Other Topics Concern   Not on file  Social History Narrative   ** Merged History Encounter **       Social Determinants of Health   Financial Resource Strain: Low Risk (03/23/2021)   Received from [facility])   Financial Resource Strain  Food Insecurity: No Food Insecurity (12/15/2022)   Hunger Vital Sign    Worried About Running Out of Food in the Last Year: Never true    Ran Out of Food in the Last Year: Never true  Transportation Needs: No Transportation Needs (12/15/2022)   PRAPARE - Administrator, Civil Service (Medical): No    Lack of Transportation (Non-Medical): No  Physical Activity: Not on File (10/31/2017)   Received from [place], [place]   Physical Activity    Physical Activity: 0  Stress: Low Risk (03/23/2021)   [facility])   Stress    Over the last 2 weeks, how often have you been bothered by the following problems: feeling nervous, anxious, on edge?: Not at all    Over the last 2 weeks, how often have you been bothered by the following problems: Not being able to stop or control worrying?: Not at all  Social Connections: Unknown (07/19/2021)   Received from [facility]   Social Network    Social Network: Not on file   Family History  Problem Relation Age of Onset   Diabetes Mother    Diabetes Father    Heart disease Father    Diabetes Sister    Congestive Heart Failure Sister 3   Diabetes Brother    - negative except otherwise stated in the family history section Allergies  Allergen Reactions   Isovue [Iopamidol] Anaphylaxis, [name] and Other (See Comments)    11/28/17 Patient had seizure like activity and then 1 min code after 100 cc of isovue 300. Possible contrast allergy vs vasovagal episode  Cardiac Arrest   Neurontin  [Gabapentin] [name] and Swelling   Nsaids Anaphylaxis and Other (See Comments)    Hx of stomach ulcers   Penicillins Itching, Palpitations and Other (See Comments)    Flushing (Red Skin) Laryngeal Edema   Reglan [Metoclopramide] Other (See Comments)    Tardive dyskinesia    Valium [Diazepam] [name]   Zestril [Lisinopril] Anaphylaxis and Swelling    Tongue and mouth swelling Laryngeal Edema   Tolectin [Tolmetin] Nausea And Vomiting, Nausea Only and Other (See Comments)    Irritates stomach ulcer   Asa [Aspirin] Other (See Comments)    Hx of stomach ulcer   Aspartame And Phenylalanine Hives   Bentyl [Dicyclomine] Other (See Comments)    Chest pain   Hibiclens [Chlorhexidine Gluconate] Other (See Comments)    Dermatitis    Flexeril [Cyclobenzaprine] Palpitations   Oxycontin [Oxycodone] Palpitations   Rifamycins Palpitations  Tylenol [Acetaminophen] Nausea And Vomiting, Nausea Only and Other (See Comments)    Irritates stomach ulcer Abdominal pain   Ultram [Tramadol] Nausea And Vomiting and Palpitations   Prior to Admission medications   Medication Sig Start Date End Date Taking? Authorizing Provider  albuterol (PROVENTIL) (2.5 MG/3ML) 0.083% nebulizer solution Take 3 mLs (2.5 mg total) by nebulization every 6 (six) hours as needed for wheezing or shortness of breath. 04/06/19   Bing Neighbors, NP  allopurinol (ZYLOPRIM) 100 MG tablet Take 1 tablet (100 mg total) by mouth 2 (two) times daily. 11/30/22   Azucena Fallen, MD  apixaban (ELIQUIS) 5 MG TABS tablet Take 5 mg by mouth 2 (two) times daily. 12/03/21   [provider]  atorvastatin (LIPITOR) 10 MG tablet Take 10 mg by mouth daily. 12/03/21   [provider]  busPIRone (BUSPAR) 5 MG tablet Take 5 mg by mouth 2 (two) times daily.    [provider]  carvedilol (COREG) 12.5 MG tablet Take 12.5 mg by mouth 2 (two) times daily.    [provider]  cetirizine  (ZYRTEC) 10 MG tablet Take 10 mg by mouth daily. 08/05/22   [provider]  colchicine 0.6 MG tablet Take 0.5 tablets (0.3 mg total) by mouth 2 (two) times a week. 12/02/22   Azucena Fallen, MD  doxercalciferol (HECTOROL) 4 MCG/2ML injection Inject 2 mLs (4 mcg total) into the vein every Monday, Wednesday, and Friday with hemodialysis. Patient not taking: Reported on 12/16/2022 11/30/22   Azucena Fallen, MD  EASY COMFORT PEN NEEDLES 31G X 5 MM MISC USE 3 TIMES A DAY FOR INSULIN ADMINISTRATION 11/14/19   Meccariello, Solmon Ice, MD  famotidine (PEPCID) 20 MG tablet Take 20 mg by mouth daily.    [provider]  ferric citrate (AURYXIA) 1 GM 210 MG(Fe) tablet Take 2 tablets (420 mg total) by mouth 3 (three) times daily with meals. Patient taking differently: Take 210 mg by mouth 3 (three) times daily with meals. 11/30/22   Azucena Fallen, MD  fluticasone [facility]) 50 MCG/ACT nasal spray Place 2 sprays into both nostrils daily as needed for allergies or rhinitis. Patient taking differently: Place 1 spray into both nostrils daily as needed for allergies. 12/19/18   Rai, Delene Ruffini, MD  gabapentin (NEURONTIN) 100 MG capsule Take 1 capsule (100 mg total) by mouth 3 (three) times daily. 11/30/22   Azucena Fallen, MD  hydrALAZINE (APRESOLINE) 50 MG tablet Take 50 mg by mouth 3 (three) times daily.    [provider]  HYDROmorphone (DILAUDID) 4 MG tablet Take 1 tablet (4 mg total) by mouth 3 (three) times daily as needed for moderate pain. Three times a day as needed 20 days out of the month. Patient taking differently: Take 4 mg by mouth 2 (two) times daily. 11/18/22   Jones Bales, NP  insulin lispro (HUMALOG) 100 UNIT/ML KwikPen Before each meal 3 times a day, 140-199 - 2 units, 200-250 - 6 units, 251-299 - 8 units,  300-349 - 12 units,  350 or above 14 units. Patient taking differently: Inject 2-14 Units into the skin See admin instructions. Before each meal  and at bedtime. 140-199 - 2 units, 200-250 - 6 units, 251-299 - 8 units,  300-349 - 12 units,  350 or above 14 units. 06/04/22   Leroy Sea, MD  isosorbide mononitrate (IMDUR) 60 MG 24 hr tablet Take 60 mg by mouth daily.    [provider]  midodrine (PROAMATINE) 5 MG tablet Take 1 tablet (5 mg total) by mouth 3 (three) times daily with meals. 11/30/22   Azucena Fallen, MD  mirtazapine (REMERON) 15 MG tablet Take 15 mg by mouth at bedtime. 10/18/22   [provider]  pantoprazole (PROTONIX) 40 MG tablet Take 40 mg by mouth 2 (two) times daily.    [provider]  spironolactone (ALDACTONE) 25 MG tablet Take 12.5 mg by mouth daily.    [provider]  torsemide (DEMADEX) 100 MG tablet Take 50 mg by mouth 2 (two) times daily.    [provider]   MR FOOT LEFT WO CONTRAST  Result Date: 12/20/2022 CLINICAL DATA:  Heel pain. Chronic. Persistent confusion and leukocytosis. EXAM: MRI OF THE LEFT FOOT WITHOUT CONTRAST TECHNIQUE: Multiplanar, multisequence MR imaging of the left was performed. No intravenous contrast was administered. COMPARISON:  Left ankle and left foot radiographs 05/30/2015 FINDINGS: Despite efforts by the technologist and patient, mild-to-moderate motion artifact is present on today's exam and could not be eliminated. This reduces exam sensitivity and specificity. TENDONS Peroneal: There is a "chevron configuration of the peroneus brevis tendon starting at the level of the distal fibular metadiaphysis and involving an approximate 5 cm length of the tendon (axial series 4 images 12 through 26, to the level of the inferior border of the anterior process of the calcaneus. Minimal tenosynovitis. The peroneus longus is intact. Posteromedial: Mild distal posterior tibial intermediate T2 signal tendinosis (axial images 23-28). The flexor digitorum longus tendon is intact. Mild flexor hallucis longus tenosynovitis. Anterior: The tibialis  anterior, extensor hallucis longus, and extensor digitorum longus tendons are intact. Achilles: Mild intermediate T2 signal Achilles tendinosis along an approximate 2.5 cm length of the distal Achilles tendon, greatest distally (sagittal series 7, image 13). No fluid bright tear Plantar Fascia: Moderate plantar calcaneal heel spur. The plantar fascia origin is intact. LIGAMENTS Lateral: Mild attenuation of the anterior talofibular ligament, possibly the sequela of remote trauma. No full-thickness tear. The calcaneofibular, posterior talofibular, and anterior and posterior tibiofibular ligaments are intact. Medial: The tibiotalar deep deltoid and tibial spring ligaments are intact. CARTILAGE Ankle Joint: Mild-to-moderate tibiotalar cartilage thinning. There is likely full-thickness cartilage loss focally at the anteromedial aspect of the talar dome with moderate focal subchondral marrow edema (coronal series 5, image 19 and sagittal series 7, image 12). Subtalar Joints/Sinus Tarsi: Within limitation of motion artifact, there appears to be mild-to-moderate posterior and middle subtalar joint cartilage thinning.Fat is preserved within the sinus tarsi. Bones: Moderate dorsal talonavicular, navicular-cuneiform, and tarsometatarsal joint space narrowing and peripheral osteophytes. Other: There is moderate edema within the abductor hallucis longus and the flexor digitorum longus muscles. High-grade edema and likely mild atrophy of the abductor digiti minimi muscle (coronal series 6, image 29 and axial series 4, image 32). There is moderate edema and swelling of the plantar posterolateral subcutaneous fat at the level of the posterior calcaneus. In this region there is mild-to-moderate patchy marrow edema within the adjacent plantar posterolateral aspect of the calcaneus with some areas of mild cortical thinning (e.g. coronal series 5, image 33 and axial series 4, image 30) suspicious for early osteomyelitis. The marrow  edema extends up to approximately 2 cm in craniocaudal dimension and 2 cm along the longitudinal dimension of the posterolateral calcaneus. IMPRESSION: 1. Moderate edema and swelling of the plantar posterolateral subcutaneous fat at the level of the posterior calcaneus. In this region there is mild-to-moderate patchy marrow edema within the adjacent  plantar posterolateral aspect of the calcaneus with some areas of mild cortical thinning suspicious for early osteomyelitis. 2. Moderate edema within the abductor hallucis longus and the flexor digitorum longus muscles. High-grade edema and likely mild atrophy of the abductor digiti minimi muscle. This myositis is nonspecific and may be related to denervation. 3. Mild distal posterior tibial and Achilles tendinosis. 4. Mild flexor hallucis longus tenosynovitis. 5. Moderate plantar calcaneal heel spur. 6. Focal full-thickness cartilage loss at the anteromedial aspect of the talar dome with moderate focal subchondral marrow edema. Electronically Signed   By: Neita Garnet M.D.   On: 12/20/2022 09:04   - pertinent xrays, CT, MRI studies were reviewed and independently interpreted  Positive ROS: All other systems have been reviewed and were otherwise negative with the exception of those mentioned in the HPI and as above.  Physical Exam: General: Alert, no acute distress Psychiatric: Patient is competent for consent with normal mood and affect Lymphatic: No axillary or cervical lymphadenopathy Cardiovascular: No pedal edema Respiratory: No cyanosis, no use of accessory musculature GI: No organomegaly, abdomen is soft and non-tender    Images:  @ENCIMAGES @  Labs:  Lab Results  Component Value Date   HGBA1C 8.1 (H) 06/19/2022   HGBA1C 10.1 (H) 03/21/2022   HGBA1C 7.5 (H) 08/16/2021   ESRSEDRATE 122 (H) 11/24/2022   CRP 2.7 (H) 11/30/2022   CRP 3.8 (H) 11/29/2022   CRP 5.5 (H) 11/28/2022   LABURIC 6.7 11/28/2022   LABURIC 6.2 07/04/2022   LABURIC  8.0 (H) 06/20/2022   REPTSTATUS 11/28/2022 FINAL 11/23/2022   CULT  11/23/2022    NO GROWTH 5 DAYS Performed at [facility], [address] [place], [place] [address]    LABORGA ESCHERICHIA COLI (A) 08/15/2021    Lab Results  Component Value Date   ALBUMIN 1.8 (L) 12/20/2022   ALBUMIN 1.8 (L) 12/17/2022   ALBUMIN 1.5 (L) 12/14/2022   LABURIC 6.7 11/28/2022   LABURIC 6.2 07/04/2022   LABURIC 8.0 (H) 06/20/2022        Latest Ref Rng & Units 12/20/2022    2:59 AM 12/18/2022    5:46 PM 12/17/2022    8:48 AM  CBC EXTENDED  WBC 4.0 - 10.5 K/uL 21.7  23.9  22.4   RBC 3.87 - 5.11 MIL/uL 2.49  2.71  2.55   Hemoglobin 12.0 - 15.0 g/dL 6.9  7.8  7.2   HCT 21.3 - 46.0 % 23.8  25.9  23.9   Platelets 150 - 400 K/uL 448  439  446   NEUT# 1.7 - 7.7 K/uL 18.3     Lymph# 0.7 - 4.0 K/uL 1.8       Neurologic: Patient does not have protective sensation bilateral lower extremities.   MUSCULOSKELETAL:   Skin: Examination patient has a palpable dorsalis pedis pulse.  Patient now has fluctuance with clear serosanguineous drainage from the necrotic decubitus heel ulcer.  There is no ascending cellulitis.  Patient has no active motor function in the right lower extremity.  Review of the MRI scan shows soft tissue infection as well as edema involving 30% of the calcaneus consistent with osteomyelitis.  Review of the endovascular evaluation shows three-vessel runoff to the right foot.  Hemoglobin 6.9 with a white cell count of 21.7.  Albumin 1.8 and a hemoglobin A1c of 8.1.  Assessment: Assessment: Uncontrolled type 2 diabetes with protein caloric malnutrition with osteomyelitis and ulceration right heel.  Plan: Plan: I will need to talk to the patient  regarding recommendation to proceed with a below-knee amputation.  Patient will need blood transfusion prior to surgery.  Anticipate surgery on Friday after I have time to talk with the patient and her family.  Thank you for the consult  and the opportunity to see Ms. Charmaine Downs, MD [facility] [phone] 7:49 AM

## 2022-12-21 NOTE — H&P (View-Only) (Signed)
Attending physician's note   I have taken a history, reviewed the chart, and examined the patient. I performed a substantive portion of this encounter, including complete performance of at least one of the key components, in conjunction with the APP. I agree with the APP's note, impression, and recommendations with my edits.   Plan for EGD/colonoscopy tomorrow.  If unremarkable, plan for VCE placement for small bowel interrogation.    Kizzy Olafson, DO, FACG [phone] office          Progress Note   Subjective  Chief Complaint: Abdominal pain with lower GI bleed on Eliquis  Today, patient is found during dialysis.  She denies any acute complaints or concerns.  Aware of plans for EGD and colonoscopy tomorrow.   Objective   Vital signs in last 24 hours: Temp:  [96.9 F (36.1 C)-98.7 F (37.1 C)] 96.9 F (36.1 C) (10/15 0809) Pulse Rate:  [88-107] 90 (10/15 1130) Resp:  [12-23] 21 (10/15 1130) BP: (110-152)/(48-91) 129/59 (10/15 1130) SpO2:  [96 %-100 %] 96 % (10/15 1130) Weight:  [126.1 kg-129.4 kg] 126.1 kg (10/15 0809) Last BM Date : 12/20/22 General:    Chronically ill-appearing African-American female in NAD Heart:  Regular rate and rhythm; no murmurs Lungs: Respirations even and unlabored, lungs CTA bilaterally Abdomen:  Soft, nontender and nondistended. Normal bowel sounds. Psych:  Cooperative. Normal mood and affect.  Intake/Output from previous day: 10/14 0701 - 10/15 0700 In: 875 [P.O.:560; Blood:315] Out: -   Lab Results: Recent Labs    12/18/22 1746 12/20/22 0259 12/21/22 0820  WBC 23.9* 21.7* 22.9*  HGB 7.8* 6.9* 7.8*  HCT 25.9* 23.8* 25.8*  PLT 439* 448* 492*   BMET Recent Labs    12/20/22 0259 12/21/22 0820  NA 137 135  K 3.5 3.4*  CL 97* 97*  CO2 23 26  GLUCOSE 61* 108*  BUN 34* 36*  CREATININE 5.58* 5.61*  CALCIUM 8.1* 8.3*   LFT Recent Labs    12/20/22 0259 12/21/22 0820  PROT 6.0*  --   ALBUMIN 1.8* 1.9*  AST 7*   --   ALKPHOS 87  --    Studies/Results: MR FOOT LEFT WO CONTRAST  Result Date: 12/20/2022 CLINICAL DATA:  Heel pain. Chronic. Persistent confusion and leukocytosis. EXAM: MRI OF THE LEFT FOOT WITHOUT CONTRAST TECHNIQUE: Multiplanar, multisequence MR imaging of the left was performed. No intravenous contrast was administered. COMPARISON:  Left ankle and left foot radiographs 05/30/2015 FINDINGS: Despite efforts by the technologist and patient, mild-to-moderate motion artifact is present on today's exam and could not be eliminated. This reduces exam sensitivity and specificity. TENDONS Peroneal: There is a "chevron configuration of the peroneus brevis tendon starting at the level of the distal fibular metadiaphysis and involving an approximate 5 cm length of the tendon (axial series 4 images 12 through 26, to the level of the inferior border of the anterior process of the calcaneus. Minimal tenosynovitis. The peroneus longus is intact. Posteromedial: Mild distal posterior tibial intermediate T2 signal tendinosis (axial images 23-28). The flexor digitorum longus tendon is intact. Mild flexor hallucis longus tenosynovitis. Anterior: The tibialis anterior, extensor hallucis longus, and extensor digitorum longus tendons are intact. Achilles: Mild intermediate T2 signal Achilles tendinosis along an approximate 2.5 cm length of the distal Achilles tendon, greatest distally (sagittal series 7, image 13). No fluid bright tear Plantar Fascia: Moderate plantar calcaneal heel spur. The plantar fascia origin is intact. LIGAMENTS Lateral: Mild attenuation of the anterior talofibular ligament,  possibly the sequela of remote trauma. No full-thickness tear. The calcaneofibular, posterior talofibular, and anterior and posterior tibiofibular ligaments are intact. Medial: The tibiotalar deep deltoid and tibial spring ligaments are intact. CARTILAGE Ankle Joint: Mild-to-moderate tibiotalar cartilage thinning. There is likely  full-thickness cartilage loss focally at the anteromedial aspect of the talar dome with moderate focal subchondral marrow edema (coronal series 5, image 19 and sagittal series 7, image 12). Subtalar Joints/Sinus Tarsi: Within limitation of motion artifact, there appears to be mild-to-moderate posterior and middle subtalar joint cartilage thinning.Fat is preserved within the sinus tarsi. Bones: Moderate dorsal talonavicular, navicular-cuneiform, and tarsometatarsal joint space narrowing and peripheral osteophytes. Other: There is moderate edema within the abductor hallucis longus and the flexor digitorum longus muscles. High-grade edema and likely mild atrophy of the abductor digiti minimi muscle (coronal series 6, image 29 and axial series 4, image 32). There is moderate edema and swelling of the plantar posterolateral subcutaneous fat at the level of the posterior calcaneus. In this region there is mild-to-moderate patchy marrow edema within the adjacent plantar posterolateral aspect of the calcaneus with some areas of mild cortical thinning (e.g. coronal series 5, image 33 and axial series 4, image 30) suspicious for early osteomyelitis. The marrow edema extends up to approximately 2 cm in craniocaudal dimension and 2 cm along the longitudinal dimension of the posterolateral calcaneus. IMPRESSION: 1. Moderate edema and swelling of the plantar posterolateral subcutaneous fat at the level of the posterior calcaneus. In this region there is mild-to-moderate patchy marrow edema within the adjacent plantar posterolateral aspect of the calcaneus with some areas of mild cortical thinning suspicious for early osteomyelitis. 2. Moderate edema within the abductor hallucis longus and the flexor digitorum longus muscles. High-grade edema and likely mild atrophy of the abductor digiti minimi muscle. This myositis is nonspecific and may be related to denervation. 3. Mild distal posterior tibial and Achilles tendinosis. 4. Mild  flexor hallucis longus tenosynovitis. 5. Moderate plantar calcaneal heel spur. 6. Focal full-thickness cartilage loss at the anteromedial aspect of the talar dome with moderate focal subchondral marrow edema. Electronically Signed   By: Neita Garnet M.D.   On: 12/20/2022 09:04     Assessment / Plan:   Assessment: 1.  Acute on chronic anemia: Initial reports hematochezia, patient remained stable off of Eliquis, once restarted though patient had a 1 g drop in hemoglobin 7.8--> 6.9--> 1 unit PRBCs--> 7.8, Eliquis on hold again, last dose 10/14 a.m., plans for EGD and colonoscopy tomorrow 2.  ESRD on HD Tuesday Thursday Saturday 3.  Multiple pressure ulcers with oozing 4.  Diarrhea 5.  History of duodenal carcinoma status post endoscopic resection in 2021 6.  Gastroparesis 7.  Spinal stenosis in the lumbar region with neurogenic claudication 8.  PAF on Eliquis: Currently on hold, last dose the morning of 10/14  Plan: 1.  Continue to monitor hemoglobin with transfusion as needed. 2.  Patient scheduled for diagnostic EGD and colonoscopy tomorrow with Dr. Barron Alvine.  Did discuss risks, benefits, limitations and alternatives and patient agrees to proceed. 3.  Patient will be on a clear good diet today and n.p.o. at midnight 4.  Patient will start bowel prep slowly over the day as per recommendations from Dr. Barron Alvine 5.  Unable to give Reglan given patient's allergic reaction/contraindication showing tardive dyskinesia in the past with this medicine 6.  Again patient may have VCE if unrevealing studies above  Thank you for your kind consultation, we will continue to follow.   LOS:  7 days   Unk Lightning  12/21/2022, 12:01 PM

## 2022-12-21 NOTE — Progress Notes (Signed)
HD Progress Note:   12/21/22 1230  Vitals  BP (!) 141/71  MAP (mmHg) 94  BP Location Right Arm  BP Method Automatic  Patient Position (if appropriate) Lying  Pulse Rate 97  Pulse Rate Source Monitor  ECG Heart Rate 97  Resp 16  Oxygen Therapy  SpO2 100 %  O2 Device Room Air  During Treatment Monitoring  HD Safety Checks Performed Yes  Intra-Hemodialysis Comments Tx completed;Tolerated well  Dialysis Fluid Bolus Normal Saline  Bolus Amount (mL) 300 mL  Post Treatment  Dialyzer Clearance Lightly streaked  Hemodialysis Intake (mL) 0 mL  Liters Processed 55.5  Fluid Removed (mL) 1500 mL  Tolerated HD Treatment Yes  Post-Hemodialysis Comments Stable. Calm after receiving Haloperidol and Dilaudid IV and able to complete her HD treatment.  AVG/AVF Arterial Site Held (minutes) 0 minutes  AVG/AVF Venous Site Held (minutes) 0 minutes  Hemodialysis Catheter Right Subclavian Double lumen Permanent (Tunneled)  Placement Date/Time: 06/24/22 1644   Serial / Lot #: [id number]  Expiration Date: 09/08/26  Time Out: Correct patient;Correct site;Correct procedure  Maximum sterile barrier precautions: Hand hygiene;Mask;Cap;Sterile gloves;Sterile gown;Large sterile ...  Site Condition No complications  Blue Lumen Status Flushed;Heparin locked;Dead end cap in place  Red Lumen Status Flushed;Heparin locked;Dead end cap in place  Purple Lumen Status N/A  Catheter fill solution Heparin 1000 units/ml  Catheter fill volume (Arterial) 1.9 cc  Catheter fill volume (Venous) 1.9  Dressing Type Transparent  Dressing Status Antimicrobial disc in place  Interventions New dressing  Drainage Description None  Dressing Change Due 12/28/22  Post treatment catheter status Capped and Clamped    Was initially very restless and agitated. Kept on fidgeting while in bed and her HD machine kept on alarming. Given IV Haloperidol - did not help. Given once dose of IV Dilaudid. Helped her settled down and able to  complete her HD treatment.

## 2022-12-21 NOTE — Progress Notes (Signed)
[facility] Progress Note   Subjective:    Seen and examined at bedside in dialysis.  Requesting to come off HD due to agitation and abdominal pain - going to give medications to see if can make it through treatment.  Bowel prep for colo tomorrow planned.  Hb stable this AM after transfusion yesterday.   Objective Vitals:   12/21/22 0823 12/21/22 0900 12/21/22 0930 12/21/22 1000  BP: (!) 143/69 110/73 (!) 148/78 (!) 152/89  Pulse: 94 (!) 107 (!) 103 (!) 105  Resp: (!) 23 19 17 14   Temp:      TempSrc:      SpO2: 99% 99% 100% 100%  Weight:      Height:       Physical Exam General:chronically ill appearing female writhing around, wailing at times Heart:RRR, no mrg Lungs:CTAB, nml WOB on RA Abdomen:soft, NTND Extremities: trace bilateral pedal edema, large malodorous wound on R heel w/drainage Dialysis Access: [facility]   Filed Weights   12/20/22 0305 12/21/22 0500 12/21/22 0809  Weight: 127.8 kg 129.4 kg 126.1 kg    Intake/Output Summary (Last 24 hours) at 12/21/2022 1004 Last data filed at 12/21/2022 0347 Gross per 24 hour  Intake 755 ml  Output --  Net 755 ml    Additional Objective Labs: Basic Metabolic Panel: Recent Labs  Lab 12/15/22 0535 12/17/22 0848 12/20/22 0259 12/21/22 0820  NA 136 136 137 135  K 4.1 4.2 3.5 3.4*  CL 106 99 97* 97*  CO2 16* 19* 23 26  GLUCOSE 160* 94 61* 108*  BUN 76* 45* 34* 36*  CREATININE 6.74* 5.98* 5.58* 5.61*  CALCIUM 7.5* 7.9* 8.1* 8.3*  PHOS 8.2*  --  5.7* 5.0*   Liver Function Tests: Recent Labs  Lab 12/14/22 1700 12/17/22 0848 12/20/22 0259 12/21/22 0820  AST 14* 8* 7*  --   ALT 14 13  --   --   ALKPHOS 81 97 87  --   BILITOT 0.3 0.8  --   --   PROT 5.0* 6.0* 6.0*  --   ALBUMIN 1.5* 1.8* 1.8* 1.9*   Recent Labs  Lab 12/14/22 1700  LIPASE 61*   CBC: Recent Labs  Lab 12/14/22 1700 12/15/22 0535 12/17/22 0848 12/18/22 1746 12/20/22 0259 12/21/22 0820  WBC 21.0* 24.1* 22.4* 23.9* 21.7*  22.9*  NEUTROABS 19.1* 21.3*  --   --  18.3*  --   HGB 5.8* 7.0* 7.2* 7.8* 6.9* 7.8*  HCT 19.5* 23.2* 23.9* 25.9* 23.8* 25.8*  MCV 92.9 93.5 93.7 95.6 95.6 94.5  PLT 285 319 446* 439* 448* 492*   Blood Culture    Component Value Date/Time   SDES BLOOD SITE NOT SPECIFIED 11/23/2022 0023   SPECREQUEST  11/23/2022 0023    BOTTLES DRAWN AEROBIC AND ANAEROBIC Blood Culture adequate volume   CULT  11/23/2022 0023    NO GROWTH 5 DAYS Performed at [facility], [address], [name], [place] [address]    REPTSTATUS 11/28/2022 FINAL 11/23/2022 0023    Cardiac Enzymes: No results for input(s): "CKTOTAL", "CKMB", "CKMBINDEX", "TROPONINI" in the last 168 hours. CBG: Recent Labs  Lab 12/20/22 0829 12/20/22 0937 12/20/22 1147 12/20/22 1644 12/20/22 2231  GLUCAP 61* 71 63* 107* 152*   Iron Studies: No results for input(s): "IRON", "TIBC", "TRANSFERRIN", "FERRITIN" in the last 72 hours. Lab Results  Component Value Date   INR 1.9 (H) 12/15/2022   INR 1.8 (H) 11/23/2022   INR 1.1 10/08/2020   Studies/Results:  MR FOOT LEFT WO CONTRAST  Result Date: 12/20/2022 CLINICAL DATA:  Heel pain. Chronic. Persistent confusion and leukocytosis. EXAM: MRI OF THE LEFT FOOT WITHOUT CONTRAST TECHNIQUE: Multiplanar, multisequence MR imaging of the left was performed. No intravenous contrast was administered. COMPARISON:  Left ankle and left foot radiographs 05/30/2015 FINDINGS: Despite efforts by the technologist and patient, mild-to-moderate motion artifact is present on today's exam and could not be eliminated. This reduces exam sensitivity and specificity. TENDONS Peroneal: There is a "chevron configuration of the peroneus brevis tendon starting at the level of the distal fibular metadiaphysis and involving an approximate 5 cm length of the tendon (axial series 4 images 12 through 26, to the level of the inferior border of the anterior process of the calcaneus. Minimal tenosynovitis. The peroneus  longus is intact. Posteromedial: Mild distal posterior tibial intermediate T2 signal tendinosis (axial images 23-28). The flexor digitorum longus tendon is intact. Mild flexor hallucis longus tenosynovitis. Anterior: The tibialis anterior, extensor hallucis longus, and extensor digitorum longus tendons are intact. Achilles: Mild intermediate T2 signal Achilles tendinosis along an approximate 2.5 cm length of the distal Achilles tendon, greatest distally (sagittal series 7, image 13). No fluid bright tear Plantar Fascia: Moderate plantar calcaneal heel spur. The plantar fascia origin is intact. LIGAMENTS Lateral: Mild attenuation of the anterior talofibular ligament, possibly the sequela of remote trauma. No full-thickness tear. The calcaneofibular, posterior talofibular, and anterior and posterior tibiofibular ligaments are intact. Medial: The tibiotalar deep deltoid and tibial spring ligaments are intact. CARTILAGE Ankle Joint: Mild-to-moderate tibiotalar cartilage thinning. There is likely full-thickness cartilage loss focally at the anteromedial aspect of the talar dome with moderate focal subchondral marrow edema (coronal series 5, image 19 and sagittal series 7, image 12). Subtalar Joints/Sinus Tarsi: Within limitation of motion artifact, there appears to be mild-to-moderate posterior and middle subtalar joint cartilage thinning.Fat is preserved within the sinus tarsi. Bones: Moderate dorsal talonavicular, navicular-cuneiform, and tarsometatarsal joint space narrowing and peripheral osteophytes. Other: There is moderate edema within the abductor hallucis longus and the flexor digitorum longus muscles. High-grade edema and likely mild atrophy of the abductor digiti minimi muscle (coronal series 6, image 29 and axial series 4, image 32). There is moderate edema and swelling of the plantar posterolateral subcutaneous fat at the level of the posterior calcaneus. In this region there is mild-to-moderate patchy  marrow edema within the adjacent plantar posterolateral aspect of the calcaneus with some areas of mild cortical thinning (e.g. coronal series 5, image 33 and axial series 4, image 30) suspicious for early osteomyelitis. The marrow edema extends up to approximately 2 cm in craniocaudal dimension and 2 cm along the longitudinal dimension of the posterolateral calcaneus. IMPRESSION: 1. Moderate edema and swelling of the plantar posterolateral subcutaneous fat at the level of the posterior calcaneus. In this region there is mild-to-moderate patchy marrow edema within the adjacent plantar posterolateral aspect of the calcaneus with some areas of mild cortical thinning suspicious for early osteomyelitis. 2. Moderate edema within the abductor hallucis longus and the flexor digitorum longus muscles. High-grade edema and likely mild atrophy of the abductor digiti minimi muscle. This myositis is nonspecific and may be related to denervation. 3. Mild distal posterior tibial and Achilles tendinosis. 4. Mild flexor hallucis longus tenosynovitis. 5. Moderate plantar calcaneal heel spur. 6. Focal full-thickness cartilage loss at the anteromedial aspect of the talar dome with moderate focal subchondral marrow edema. Electronically Signed   By: Neita Garnet M.D.   On: 12/20/2022 09:04  Medications:   allopurinol  100 mg Oral BID   atorvastatin  10 mg Oral QHS   busPIRone  5 mg Oral BID   DULoxetine  40 mg Oral Daily   ferric citrate  420 mg Oral TID WC   folic acid  1 mg Oral Daily   Gerhardt's butt cream   Topical BID   insulin aspart  0-6 Units Subcutaneous TID WC   insulin aspart  2 Units Subcutaneous TID WC   insulin glargine-yfgn  8 Units Subcutaneous QHS   leptospermum manuka honey  1 Application Topical Daily   midodrine  5 mg Oral TID WC   mirtazapine  7.5 mg Oral QHS   pantoprazole  40 mg Oral BID AC    Dialysis Orders: [facility]   F180   400/800   3K/2.5 bath  126.9kg   RDC   - mircera 225 mcg  last 10/5 - hectorol 7 mcg   Assessment/Plan: ESRD: under dialyzed as of late given missed and shorten treatments.  HD rolled over from yesterday to today, next HD Wed.  Chronic Wounds - on R heel and LE. Drainage coming from heel. Wound care following.  AMS - MRI head completed with no acute changes. Per PMD Volume - does not look volume overloaded. UF as tolerated.  Hypertension - BP in goal.  Cont home antiHTN regimen. Acute on Chronic Anemia esrd - requiring transfusion. GI to scope tomorrow.  Eliquis on hold currently.  On ESA as well.  Secondary Hyperparathyroidism/Hyperphosphatemia - Corrected Ca okay.  Phos elevated.  Continue binders and VDRA. Vascular access - TDC in place Abdominal pain: CT scan without acute concern.  Consider constipation given opioid use.  Management per primary team. Gi resumed reglan.   Estill Bakes MD [facility] Pager [phone]

## 2022-12-21 NOTE — Progress Notes (Addendum)
Attending physician's note   I have taken a history, reviewed the chart, and examined the patient. I performed a substantive portion of this encounter, including complete performance of at least one of the key components, in conjunction with the APP. I agree with the APP's note, impression, and recommendations with my edits.   Plan for EGD/colonoscopy tomorrow.  If unremarkable, plan for VCE placement for small bowel interrogation.    Kizzy Olafson, DO, FACG [phone] office          Progress Note   Subjective  Chief Complaint: Abdominal pain with lower GI bleed on Eliquis  Today, patient is found during dialysis.  She denies any acute complaints or concerns.  Aware of plans for EGD and colonoscopy tomorrow.   Objective   Vital signs in last 24 hours: Temp:  [96.9 F (36.1 C)-98.7 F (37.1 C)] 96.9 F (36.1 C) (10/15 0809) Pulse Rate:  [88-107] 90 (10/15 1130) Resp:  [12-23] 21 (10/15 1130) BP: (110-152)/(48-91) 129/59 (10/15 1130) SpO2:  [96 %-100 %] 96 % (10/15 1130) Weight:  [126.1 kg-129.4 kg] 126.1 kg (10/15 0809) Last BM Date : 12/20/22 General:    Chronically ill-appearing African-American female in NAD Heart:  Regular rate and rhythm; no murmurs Lungs: Respirations even and unlabored, lungs CTA bilaterally Abdomen:  Soft, nontender and nondistended. Normal bowel sounds. Psych:  Cooperative. Normal mood and affect.  Intake/Output from previous day: 10/14 0701 - 10/15 0700 In: 875 [P.O.:560; Blood:315] Out: -   Lab Results: Recent Labs    12/18/22 1746 12/20/22 0259 12/21/22 0820  WBC 23.9* 21.7* 22.9*  HGB 7.8* 6.9* 7.8*  HCT 25.9* 23.8* 25.8*  PLT 439* 448* 492*   BMET Recent Labs    12/20/22 0259 12/21/22 0820  NA 137 135  K 3.5 3.4*  CL 97* 97*  CO2 23 26  GLUCOSE 61* 108*  BUN 34* 36*  CREATININE 5.58* 5.61*  CALCIUM 8.1* 8.3*   LFT Recent Labs    12/20/22 0259 12/21/22 0820  PROT 6.0*  --   ALBUMIN 1.8* 1.9*  AST 7*   --   ALKPHOS 87  --    Studies/Results: MR FOOT LEFT WO CONTRAST  Result Date: 12/20/2022 CLINICAL DATA:  Heel pain. Chronic. Persistent confusion and leukocytosis. EXAM: MRI OF THE LEFT FOOT WITHOUT CONTRAST TECHNIQUE: Multiplanar, multisequence MR imaging of the left was performed. No intravenous contrast was administered. COMPARISON:  Left ankle and left foot radiographs 05/30/2015 FINDINGS: Despite efforts by the technologist and patient, mild-to-moderate motion artifact is present on today's exam and could not be eliminated. This reduces exam sensitivity and specificity. TENDONS Peroneal: There is a "chevron configuration of the peroneus brevis tendon starting at the level of the distal fibular metadiaphysis and involving an approximate 5 cm length of the tendon (axial series 4 images 12 through 26, to the level of the inferior border of the anterior process of the calcaneus. Minimal tenosynovitis. The peroneus longus is intact. Posteromedial: Mild distal posterior tibial intermediate T2 signal tendinosis (axial images 23-28). The flexor digitorum longus tendon is intact. Mild flexor hallucis longus tenosynovitis. Anterior: The tibialis anterior, extensor hallucis longus, and extensor digitorum longus tendons are intact. Achilles: Mild intermediate T2 signal Achilles tendinosis along an approximate 2.5 cm length of the distal Achilles tendon, greatest distally (sagittal series 7, image 13). No fluid bright tear Plantar Fascia: Moderate plantar calcaneal heel spur. The plantar fascia origin is intact. LIGAMENTS Lateral: Mild attenuation of the anterior talofibular ligament,  possibly the sequela of remote trauma. No full-thickness tear. The calcaneofibular, posterior talofibular, and anterior and posterior tibiofibular ligaments are intact. Medial: The tibiotalar deep deltoid and tibial spring ligaments are intact. CARTILAGE Ankle Joint: Mild-to-moderate tibiotalar cartilage thinning. There is likely  full-thickness cartilage loss focally at the anteromedial aspect of the talar dome with moderate focal subchondral marrow edema (coronal series 5, image 19 and sagittal series 7, image 12). Subtalar Joints/Sinus Tarsi: Within limitation of motion artifact, there appears to be mild-to-moderate posterior and middle subtalar joint cartilage thinning.Fat is preserved within the sinus tarsi. Bones: Moderate dorsal talonavicular, navicular-cuneiform, and tarsometatarsal joint space narrowing and peripheral osteophytes. Other: There is moderate edema within the abductor hallucis longus and the flexor digitorum longus muscles. High-grade edema and likely mild atrophy of the abductor digiti minimi muscle (coronal series 6, image 29 and axial series 4, image 32). There is moderate edema and swelling of the plantar posterolateral subcutaneous fat at the level of the posterior calcaneus. In this region there is mild-to-moderate patchy marrow edema within the adjacent plantar posterolateral aspect of the calcaneus with some areas of mild cortical thinning (e.g. coronal series 5, image 33 and axial series 4, image 30) suspicious for early osteomyelitis. The marrow edema extends up to approximately 2 cm in craniocaudal dimension and 2 cm along the longitudinal dimension of the posterolateral calcaneus. IMPRESSION: 1. Moderate edema and swelling of the plantar posterolateral subcutaneous fat at the level of the posterior calcaneus. In this region there is mild-to-moderate patchy marrow edema within the adjacent plantar posterolateral aspect of the calcaneus with some areas of mild cortical thinning suspicious for early osteomyelitis. 2. Moderate edema within the abductor hallucis longus and the flexor digitorum longus muscles. High-grade edema and likely mild atrophy of the abductor digiti minimi muscle. This myositis is nonspecific and may be related to denervation. 3. Mild distal posterior tibial and Achilles tendinosis. 4. Mild  flexor hallucis longus tenosynovitis. 5. Moderate plantar calcaneal heel spur. 6. Focal full-thickness cartilage loss at the anteromedial aspect of the talar dome with moderate focal subchondral marrow edema. Electronically Signed   By: Neita Garnet M.D.   On: 12/20/2022 09:04     Assessment / Plan:   Assessment: 1.  Acute on chronic anemia: Initial reports hematochezia, patient remained stable off of Eliquis, once restarted though patient had a 1 g drop in hemoglobin 7.8--> 6.9--> 1 unit PRBCs--> 7.8, Eliquis on hold again, last dose 10/14 a.m., plans for EGD and colonoscopy tomorrow 2.  ESRD on HD Tuesday Thursday Saturday 3.  Multiple pressure ulcers with oozing 4.  Diarrhea 5.  History of duodenal carcinoma status post endoscopic resection in 2021 6.  Gastroparesis 7.  Spinal stenosis in the lumbar region with neurogenic claudication 8.  PAF on Eliquis: Currently on hold, last dose the morning of 10/14  Plan: 1.  Continue to monitor hemoglobin with transfusion as needed. 2.  Patient scheduled for diagnostic EGD and colonoscopy tomorrow with Dr. Barron Alvine.  Did discuss risks, benefits, limitations and alternatives and patient agrees to proceed. 3.  Patient will be on a clear good diet today and n.p.o. at midnight 4.  Patient will start bowel prep slowly over the day as per recommendations from Dr. Barron Alvine 5.  Unable to give Reglan given patient's allergic reaction/contraindication showing tardive dyskinesia in the past with this medicine 6.  Again patient may have VCE if unrevealing studies above  Thank you for your kind consultation, we will continue to follow.   LOS:  7 days   Unk Lightning  12/21/2022, 12:01 PM

## 2022-12-21 NOTE — Progress Notes (Signed)
PROGRESS NOTE  Deborah [name] UJW:[id number] DOB: 10-23-1965 DOA: 12/14/2022 PCP: Pcp, No   LOS: 7 days   Brief Narrative / Interim history: 57 y.o. female with hx of ESRD on M/W/F HD, PAF on Eliquis, HFpEF, HTN, DM-2, malignant carcinoid, NAFLD, morbid obesity, chronic ulceration R ankle, recent admission 9/16 -24 initially suspected sepsis from SSTI but ultimately sepsis ruled out, and antibiotics were stopped. That admission underwent angiogram which showed adequate perfusion of the R foot. Treated for gout involving R ankle. Returns due to initial complaint of abd pain, per ED note 1 day history persistent abd pain. Had also missed last HD session, last session on 10/4. Found to have anemia, with Hb of 5.8. On further history, per RN report patients mother provided collateral of 1 week of bright red blood per rectum   Subjective / 24h Interval events: Alert this morning, husband is at bedside.  States that she feels well.  She is very upset when hears about endoscopy and colonoscopy as she claims that she was not aware of these plans.  Assesement and Plan: Principal problem Acute on chronic anemia, concern for acute blood loss anemia due to lower GI bleed -gastroenterology consulted, appreciate input.  They saw her last week, and without overt bleeding recommended monitoring.  Hemoglobin has remained stable over the weekend, and upon resumption of her Eliquis hemoglobin dropped again to 6.9 -She was transfused a second unit of packed red blood cells on 10/14.  Hemoglobin improved appropriately this morning.  Gastroenterology was reconsulted and now are planning to do EGD/colonoscopy, tentatively tomorrow  Active problems ESRD -nephrology consulted, appreciate input.  To undergo HD today  A-fib-given new drop in hemoglobin with resumption of her Eliquis, hold Eliquis and GI has been reconsulted as above  Chronic right heel wound, concern for acute osteomyelitis-last month she underwent  an MRI that did not show any osteomyelitis.  Given persistent leukocytosis I have repeated the MRI on 10/14 which does show now early osteomyelitis.  Dr. Lajoyce Corners consulted, appreciate input. -Obtain sed rate and CRP -Nontoxic-appearing, hold antibiotics.  Needs at least a bone biopsy unless Dr. Lajoyce Corners offers an amputation and patient is agreeable.  Awaiting input from him  Acute toxic encephalopathy, possibly due to opiate medication -monitor, decrease home Dilaudid dose.  MRI negative.  Fluctuating mental status  Leukocytosis-recently on steroids for gout.  She is afebrile, but MRI does show concerns for osteomyelitis to the right heel  Metastatic duodenal carcinoid-had resection in the past  Chronic diastolic CHF-euvolemic, volume management per HD  Chronic pain-continue home Dilaudid at a lower dose  Hyperlipidemia, NAFLD-continue statin  Mood disorder-continue duloxetine, added diazepam  Gout-continue home allopurinol  Chronic wounds-wound care consult.  Right foot MRI done just 2 weeks ago without evidence of osteomyelitis  Chronic back pain, spinal stenosis, lumbar region with neurogenic claudication -now wheelchair-bound for several months, underwent an MRI of the lumbar spine earlier this year. It showed lower lumbar spondylosis worsening since 2019, worst at L4-5, with facet arthropathy with joint effusion and periarticular edema and unchanged severe right neuroforaminal narrowing at L5-S1.  Neurosurgery evaluated patient March 2024 and did not recommend surgical approach at this point but continued PT, outpatient follow-up -PT recommends SNF, family in agreement  Scheduled Meds:  allopurinol  100 mg Oral BID   atorvastatin  10 mg Oral QHS   busPIRone  5 mg Oral BID   DULoxetine  40 mg Oral Daily   ferric citrate  420 mg Oral TID  WC   folic acid  1 mg Oral Daily   Gerhardt's butt cream   Topical BID   insulin aspart  0-6 Units Subcutaneous TID WC   insulin aspart  2 Units  Subcutaneous TID WC   insulin glargine-yfgn  8 Units Subcutaneous QHS   leptospermum manuka honey  1 Application Topical Daily   midodrine  5 mg Oral TID WC   mirtazapine  7.5 mg Oral QHS   pantoprazole  40 mg Oral BID AC   Continuous Infusions:    PRN Meds:.acetaminophen, albuterol, haloperidol lactate, heparin, HYDROmorphone (DILAUDID) injection, HYDROmorphone, ondansetron (ZOFRAN) IV  Current Outpatient Medications  Medication Instructions   albuterol (PROVENTIL) 2.5 mg, Nebulization, Every 6 hours PRN   allopurinol (ZYLOPRIM) 100 mg, Oral, 2 times daily   apixaban (ELIQUIS) 5 mg, Oral, 2 times daily   atorvastatin (LIPITOR) 10 mg, Oral, Daily   busPIRone (BUSPAR) 5 mg, Oral, 2 times daily   carvedilol (COREG) 12.5 mg, Oral, 2 times daily   cetirizine (ZYRTEC) 10 mg, Oral, Daily   colchicine 0.3 mg, Oral, 2 times weekly   doxercalciferol (HECTOROL) 4 mcg, Intravenous, Every M-W-F (Hemodialysis)   EASY COMFORT PEN NEEDLES 31G X 5 MM MISC USE 3 TIMES A DAY FOR INSULIN ADMINISTRATION   famotidine (PEPCID) 20 mg, Oral, Daily   ferric citrate (AURYXIA) 420 mg, Oral, 3 times daily with meals   fluticasone (FLONASE) 50 MCG/ACT nasal spray 2 sprays, Each Nare, Daily PRN   gabapentin (NEURONTIN) 100 mg, Oral, 3 times daily   hydrALAZINE (APRESOLINE) 50 mg, Oral, 3 times daily   HYDROmorphone (DILAUDID) 4 mg, Oral, 3 times daily PRN, Three times a day as needed 20 days out of the month.   insulin lispro (HUMALOG) 100 UNIT/ML KwikPen Before each meal 3 times a day, 140-199 - 2 units, 200-250 - 6 units, 251-299 - 8 units,  300-349 - 12 units,  350 or above 14 units.   isosorbide mononitrate (IMDUR) 60 mg, Oral, Daily   midodrine (PROAMATINE) 5 mg, Oral, 3 times daily with meals   mirtazapine (REMERON) 15 mg, Oral, Daily at bedtime   pantoprazole (PROTONIX) 40 mg, Oral, 2 times daily   spironolactone (ALDACTONE) 12.5 mg, Oral, Daily   torsemide (DEMADEX) 50 mg, Oral, 2 times daily     Diet Orders (From admission, onward)     Start     Ordered   12/21/22 0000  Diet clear liquid Fluid consistency: Thin  Diet effective now       Question:  Fluid consistency:  Answer:  Thin   12/20/22 1152            DVT prophylaxis: SCDs Start: 12/14/22 2037   Lab Results  Component Value Date   PLT 492 (H) 12/21/2022      Code Status: Full Code  Family Communication: Updated Glenis Gollihue, niece, [phone], family asks that niece to be updated first  Status is: Inpatient Remains inpatient appropriate because: EGD/colonoscopy/osteomyelitis  Level of care: Telemetry Medical  Consultants:  Nephrology GI  Objective: Vitals:   12/21/22 0900 12/21/22 0930 12/21/22 1000 12/21/22 1030  BP: 110/73 (!) 148/78 (!) 152/89 (!) 134/57  Pulse: (!) 107 (!) 103 (!) 105 (!) 101  Resp: 19 17 14 16   Temp:      TempSrc:      SpO2: 99% 100% 100% 98%  Weight:      Height:        Intake/Output Summary (Last 24 hours) at 12/21/2022 1037 Last  data filed at 12/21/2022 0347 Gross per 24 hour  Intake 755 ml  Output --  Net 755 ml   Wt Readings from Last 3 Encounters:  12/21/22 126.1 kg  11/28/22 127.5 kg  10/19/22 132.9 kg    Examination:  Constitutional: NAD Eyes: lids and conjunctivae normal, no scleral icterus ENMT: mmm Neck: normal, supple Respiratory: clear to auscultation bilaterally, no wheezing, no crackles. Normal respiratory effort.  Cardiovascular: Regular rate and rhythm, no murmurs / rubs / gallops. No LE edema. Abdomen: soft, no distention, no tenderness. Bowel sounds positive.  Skin: no rashes  Data Reviewed: I have independently reviewed following labs and imaging studies   CBC Recent Labs  Lab 12/14/22 1700 12/15/22 0535 12/17/22 0848 12/18/22 1746 12/20/22 0259 12/21/22 0820  WBC 21.0* 24.1* 22.4* 23.9* 21.7* 22.9*  HGB 5.8* 7.0* 7.2* 7.8* 6.9* 7.8*  HCT 19.5* 23.2* 23.9* 25.9* 23.8* 25.8*  PLT 285 319 446* 439* 448* 492*  MCV  92.9 93.5 93.7 95.6 95.6 94.5  MCH 27.6 28.2 28.2 28.8 27.7 28.6  MCHC 29.7* 30.2 30.1 30.1 29.0* 30.2  RDW 18.3* 17.7* 17.8* 17.3* 17.1* 17.3*  LYMPHSABS 1.0 1.4  --   --  1.8  --   MONOABS 0.6 0.7  --   --  0.9  --   EOSABS 0.1 0.2  --   --  0.4  --   BASOSABS 0.1 0.1  --   --  0.1  --     Recent Labs  Lab 12/14/22 1700 12/15/22 0157 12/15/22 0535 12/17/22 0848 12/20/22 0259 12/21/22 0820  NA 132*  --  136 136 137 135  K 4.0  --  4.1 4.2 3.5 3.4*  CL 105  --  106 99 97* 97*  CO2 15*  --  16* 19* 23 26  GLUCOSE 284*  --  160* 94 61* 108*  BUN 72*  --  76* 45* 34* 36*  CREATININE 6.87*  --  6.74* 5.98* 5.58* 5.61*  CALCIUM 7.0*  --  7.5* 7.9* 8.1* 8.3*  AST 14*  --   --  8* 7*  --   ALT 14  --   --  13  --   --   ALKPHOS 81  --   --  97 87  --   BILITOT 0.3  --   --  0.8  --   --   ALBUMIN 1.5*  --   --  1.8* 1.8* 1.9*  MG  --   --  1.7 1.8 1.5*  --   INR  --  1.9*  --   --   --   --   BNP 368.9*  --   --   --   --   --     ------------------------------------------------------------------------------------------------------------------ No results for input(s): "CHOL", "HDL", "LDLCALC", "TRIG", "CHOLHDL", "LDLDIRECT" in the last 72 hours.  Lab Results  Component Value Date   HGBA1C 8.1 (H) 06/19/2022   ------------------------------------------------------------------------------------------------------------------ No results for input(s): "TSH", "T4TOTAL", "T3FREE", "THYROIDAB" in the last 72 hours.  Invalid input(s): "FREET3"  Cardiac Enzymes No results for input(s): "CKMB", "TROPONINI", "MYOGLOBIN" in the last 168 hours.  Invalid input(s): "CK" ------------------------------------------------------------------------------------------------------------------    Component Value Date/Time   BNP 368.9 (H) 12/14/2022 1700    CBG: Recent Labs  Lab 12/20/22 0829 12/20/22 0937 12/20/22 1147 12/20/22 1644 12/20/22 2231  GLUCAP 61* 71 63* 107* 152*    No  results found for this or any previous visit (from the past 240  hour(s)).   Radiology Studies: No results found.   Pamella Pert, MD, PhD [facility]  Between 7 am - 7 pm I am available, please contact me via Amion (for emergencies) or Securechat (non urgent messages)  Between 7 pm - 7 am I am not available, please contact night coverage MD/APP via Amion

## 2022-12-21 NOTE — Progress Notes (Signed)
PT Cancellation Note  Patient Details Name: Deborah [name] MRN: [id number] DOB: 21-Aug-1965   Cancelled Treatment:    Reason Eval/Treat Not Completed: Patient declined, no reason specified (Pt off the floor at dialysis. Will follow up if time allows.)   [name] 12/21/2022, 9:12 AM

## 2022-12-22 DIAGNOSIS — D649 Anemia, unspecified: Secondary | ICD-10-CM | POA: Diagnosis not present

## 2022-12-22 LAB — MAGNESIUM: Magnesium: 1.8 mg/dL (ref 1.7–2.4)

## 2022-12-22 LAB — COMPREHENSIVE METABOLIC PANEL
ALT: 7 U/L (ref 0–44)
AST: 8 U/L — ABNORMAL LOW (ref 15–41)
Albumin: 1.8 g/dL — ABNORMAL LOW (ref 3.5–5.0)
Alkaline Phosphatase: 79 U/L (ref 38–126)
Anion gap: 11 (ref 5–15)
BUN: 20 mg/dL (ref 6–20)
CO2: 26 mmol/L (ref 22–32)
Calcium: 8.2 mg/dL — ABNORMAL LOW (ref 8.9–10.3)
Chloride: 98 mmol/L (ref 98–111)
Creatinine, Ser: 3.93 mg/dL — ABNORMAL HIGH (ref 0.44–1.00)
GFR, Estimated: 13 mL/min — ABNORMAL LOW (ref >60)
Glucose, Bld: 100 mg/dL — ABNORMAL HIGH (ref 70–99)
Potassium: 3.3 mmol/L — ABNORMAL LOW (ref 3.5–5.1)
Sodium: 135 mmol/L (ref 135–145)
Total Bilirubin: 0.9 mg/dL (ref 0.3–1.2)
Total Protein: 5.9 g/dL — ABNORMAL LOW (ref 6.5–8.1)

## 2022-12-22 LAB — CBC
HCT: 24.1 % — ABNORMAL LOW (ref 36.0–46.0)
Hemoglobin: 7.3 g/dL — ABNORMAL LOW (ref 12.0–15.0)
MCH: 28.2 pg (ref 26.0–34.0)
MCHC: 30.3 g/dL (ref 30.0–36.0)
MCV: 93.1 fL (ref 80.0–100.0)
Platelets: 430 10*3/uL — ABNORMAL HIGH (ref 150–400)
RBC: 2.59 MIL/uL — ABNORMAL LOW (ref 3.87–5.11)
RDW: 17.2 % — ABNORMAL HIGH (ref 11.5–15.5)
WBC: 25.7 10*3/uL — ABNORMAL HIGH (ref 4.0–10.5)
nRBC: 0 % (ref 0.0–0.2)

## 2022-12-22 LAB — GLUCOSE, CAPILLARY
Glucose-Capillary: 96 mg/dL (ref 70–99)
Glucose-Capillary: 85 mg/dL (ref 70–99)
Glucose-Capillary: 81 mg/dL (ref 70–99)

## 2022-12-22 LAB — VITAMIN B1: Vitamin B1 (Thiamine): 115.3 nmol/L (ref 66.5–200.0)

## 2022-12-22 MED ORDER — PEG-KCL-NACL-NASULF-NA ASC-C 100 G PO SOLR
0.5000 | Freq: Once | ORAL | Status: AC
  Administered 2022-12-23: 100 g via ORAL

## 2022-12-22 MED ORDER — MIDODRINE HCL 5 MG PO TABS
10.0000 mg | ORAL_TABLET | Freq: Once | ORAL | Status: AC
  Administered 2022-12-22: 10 mg via ORAL
  Filled 2022-12-22: qty 2

## 2022-12-22 MED ORDER — HYDROMORPHONE HCL 1 MG/ML IJ SOLN
0.5000 mg | INTRAMUSCULAR | Status: AC | PRN
  Administered 2022-12-22 – 2022-12-24 (×12): 0.5 mg via INTRAVENOUS
  Filled 2022-12-22 (×14): qty 0.5

## 2022-12-22 MED ORDER — HYDROMORPHONE HCL 2 MG PO TABS: 2.0000 mg | ORAL_TABLET | Freq: Three times a day (TID) | ORAL | Status: DC | PRN

## 2022-12-22 MED ORDER — PEG-KCL-NACL-NASULF-NA ASC-C 100 G PO SOLR: 1.0000 | Freq: Once | ORAL | Status: DC

## 2022-12-22 MED ORDER — HEPARIN SODIUM (PORCINE) 1000 UNIT/ML IJ SOLN
3800.0000 [IU] | Freq: Once | INTRAMUSCULAR | Status: AC
  Administered 2022-12-22: 3800 [IU]
  Filled 2022-12-22: qty 4

## 2022-12-22 MED ORDER — HYDROMORPHONE HCL 1 MG/ML IJ SOLN
0.5000 mg | INTRAMUSCULAR | Status: AC | PRN
  Administered 2022-12-22 (×2): 0.5 mg via INTRAVENOUS
  Filled 2022-12-22 (×2): qty 0.5

## 2022-12-22 MED ORDER — PEG-KCL-NACL-NASULF-NA ASC-C 100 G PO SOLR
0.5000 | Freq: Once | ORAL | Status: AC
  Administered 2022-12-22: 100 g via ORAL
  Filled 2022-12-22: qty 1

## 2022-12-22 MED ORDER — HYDROMORPHONE HCL 2 MG PO TABS
4.0000 mg | ORAL_TABLET | Freq: Three times a day (TID) | ORAL | Status: DC | PRN
  Administered 2022-12-22 – 2022-12-28 (×5): 4 mg via ORAL
  Filled 2022-12-22 (×10): qty 2

## 2022-12-22 NOTE — Plan of Care (Signed)
Problem: Coping: Goal: Ability to adjust to condition or change in health will improve Outcome: Progressing   Problem: Nutritional: Goal: Maintenance of adequate nutrition will improve Outcome: Progressing   Problem: Nutrition: Goal: Adequate nutrition will be maintained Outcome: Progressing

## 2022-12-22 NOTE — Progress Notes (Signed)
Physical Therapy Treatment Patient Details Name: Deborah [name] MRN: [id number] DOB: 1965/09/21 Today's Date: 12/22/2022   History of Present Illness The pt is a 57 yo female presenting 10/8 with weakness, diarrhea x4 days, nausea, vomiting, and AMS over last month. Work up revealed Hgb of 5.8, undergoing continued work up for GIB. PMH includes: prior cancer of duodenum, ESRD, GERD, obesity, diabetes, and chronic sacral wounds.    PT Comments  Patient resting in bed at start of session and mother at bedside. Pt agreeable to sit up to EOB and initiated reaching for bed rail to lift trunk upright. Pt required cues and min assist to bring LE's off edge and to fully raise trunk. Pt with constant trunk sway and required repeated/frequent cues to attend to therapist and tasks. Able to follow simple cues for leg kick bil LE. Pt wishing to return to supine, cued to initiate lowering trunk but pt internally distracted and began removing gown instead due to not liking it and being hot. Cues to re-attend to task and pt able to grab bed rail and min-mod assist provided to raise LE's and return to supine. EOS pt repositioned and Rt heel mepilex replaced as it was rolled off foot almost. Alarm on and call bell within reach. Will progress pt as able during acute stay.    If plan is discharge home, recommend the following: Two people to help with walking and/or transfers;A lot of help with bathing/dressing/bathroom;Assist for transportation;Supervision due to cognitive status;Help with stairs or ramp for entrance;Direct supervision/assist for medications management;Direct supervision/assist for financial management   Can travel by private vehicle     No  Equipment Recommendations  Hoyer lift;Hospital bed;Wheelchair cushion (measurements PT);Wheelchair (measurements PT) (defer to next venue)    Recommendations for Other Services OT consult     Precautions / Restrictions Precautions Precautions:  Fall Precaution Comments: sacral wounds, bilateral heel wounds Restrictions Weight Bearing Restrictions: Yes Other Position/Activity Restrictions: from last admission last month, "Per Dr Lajoyce Corners, ideally will be NWB on rt heel, but put as little weight as possible on rt foot'     Mobility  Bed Mobility Overal bed mobility: Needs Assistance Bed Mobility: Rolling, Sidelying to Sit, Sit to Supine Rolling: Min assist, Used rails Sidelying to sit: Min assist, Used rails   Sit to supine: Min assist, Used rails   General bed mobility comments: Pt able to use bed rail to raise trunk upright 50% to long sitting. Cues needed to bring LE's off EOB and min assist to complete. Pt leaning Rt/Lt ad swaying while sitting EOB, close CGA for safety. Pt required Min-mod assist to lower trunk and raise LE's to supine. With bed in trendelenburg was able to use head board to boost self superiorly.    Transfers                        Ambulation/Gait                   Stairs             Wheelchair Mobility     Tilt Bed    Modified Rankin (Stroke Patients Only)       Balance Overall balance assessment: Needs assistance Sitting-balance support: Bilateral upper extremity supported, Feet supported Sitting balance-Leahy Scale: Poor Sitting balance - Comments: reliant on UE support  Cognition Arousal: Alert Behavior During Therapy: Flat affect Overall Cognitive Status: Impaired/Different from baseline Area of Impairment: Orientation, Attention, Following commands, Safety/judgement, Problem solving, Awareness                 Orientation Level: Disoriented to, Situation, Time Current Attention Level: Focused   Following Commands: Follows one step commands inconsistently Safety/Judgement: Decreased awareness of safety, Decreased awareness of deficits Awareness: Intellectual Problem Solving: Slow processing, Decreased  initiation, Difficulty sequencing, Requires verbal cues, Requires tactile cues          Exercises      General Comments        Pertinent Vitals/Pain Pain Assessment Pain Assessment: Faces Faces Pain Scale: Hurts a little bit Pain Location: generalized and bil LE Pain Descriptors / Indicators: Discomfort Pain Intervention(s): Limited activity within patient's tolerance, Monitored during session, Repositioned    Home Living                          Prior Function            PT Goals (current goals can now be found in the care plan section) Acute Rehab PT Goals Patient Stated Goal: to reduce pain PT Goal Formulation: Patient unable to participate in goal setting Time For Goal Achievement: 01/01/23 Potential to Achieve Goals: Fair Progress towards PT goals: Progressing toward goals    Frequency    Min 1X/week      PT Plan      Co-evaluation              AM-PAC PT "6 Clicks" Mobility   Outcome Measure  Help needed turning from your back to your side while in a flat bed without using bedrails?: A Lot Help needed moving from lying on your back to sitting on the side of a flat bed without using bedrails?: A Lot Help needed moving to and from a bed to a chair (including a wheelchair)?: Total Help needed standing up from a chair using your arms (e.g., wheelchair or bedside chair)?: Total Help needed to walk in hospital room?: Total Help needed climbing 3-5 steps with a railing? : Total 6 Click Score: 8    End of Session   Activity Tolerance: Patient limited by pain (limited by cognition) Patient left: in bed;with call bell/phone within reach;with bed alarm set;with family/visitor present Nurse Communication: Mobility status PT Visit Diagnosis: Muscle weakness (generalized) (M62.81);Difficulty in walking, not elsewhere classified (R26.2)     Time: 2951-8841 PT Time Calculation (min) (ACUTE ONLY): 15 min  Charges:    $Therapeutic Activity:  8-22 mins PT General Charges $$ ACUTE PT VISIT: 1 Visit                     Wynn Maudlin, DPT Acute Rehabilitation Services Office [phone]  12/22/22 3:38 PM

## 2022-12-22 NOTE — Progress Notes (Signed)
PROGRESS NOTE  Deborah [name]:[id number] DOB: 06-08-1965 DOA: 12/14/2022 PCP: Pcp, No   LOS: 8 days   Brief Narrative / Interim history: 57 y.o. female with hx of ESRD on M/W/F HD, PAF on Eliquis, HFpEF, HTN, DM-2, malignant carcinoid, NAFLD, morbid obesity, chronic ulceration R ankle, recent admission 9/16 -24 initially suspected sepsis from SSTI but ultimately sepsis ruled out, and antibiotics were stopped. That admission underwent angiogram which showed adequate perfusion of the R foot. Treated for gout involving R ankle. Returns due to initial complaint of abd pain, per ED note 1 day history persistent abd pain. Had also missed last HD session, last session on 10/4. Found to have anemia, with Hb of 5.8. On further history, per RN report patients mother provided collateral of 1 week of bright red blood per rectum   Subjective / 24h Interval events: In HD-- c/o stomach pain   Assesement and Plan:  Acute on chronic anemia, concern for acute blood loss anemia due to lower GI bleed -gastroenterology consulted, appreciate input.  They saw her last week, and without overt bleeding recommended monitoring.  Hemoglobin has remained stable over the weekend, and upon resumption of her Eliquis hemoglobin dropped again to 6.9 -She was transfused a second unit of packed red blood cells on 10/14.  Hemoglobin improved appropriately this morning.  Gastroenterology was reconsulted and now are planning to do EGD/colonoscopy-10/16   ESRD -nephrology consulted, appreciate input.   - HD  10/16  A-fib-given new drop in hemoglobin with resumption of her Eliquis, hold Eliquis and GI has been reconsulted as above  Chronic right heel wound, concern for acute osteomyelitis-last month she underwent an MRI that did not show any osteomyelitis.  Given persistent leukocytosis I have repeated the MRI on 10/14 which does show now early osteomyelitis.  Dr. Lajoyce Corners consulted, appreciate input. -Nontoxic-appearing,  hold antibiotics.  Needs at least a bone biopsy unless Dr. Lajoyce Corners offers an amputation and patient is agreeable- -he plans to speak with family  Acute toxic encephalopathy, possibly due to opiate medication -monitor, decrease home Dilaudid dose.  MRI negative.    Leukocytosis-recently on steroids for gout.  She is afebrile, but MRI does show concerns for osteomyelitis to the right heel  Metastatic duodenal carcinoid-had resection in the past  Chronic diastolic CHF-euvolemic, volume management per HD  Chronic pain-continue home Dilaudid at a lower dose, IV PRN dilaudid for SEVERE pain  Hyperlipidemia, NAFLD-continue statin  Mood disorder-continue duloxetine, added diazepam  Gout-continue home allopurinol  Chronic wounds-wound care consult.  Right foot MRI done just 2 weeks ago without evidence of osteomyelitis  Chronic back pain, spinal stenosis, lumbar region with neurogenic claudication -now wheelchair-bound for several months, underwent an MRI of the lumbar spine earlier this year. It showed lower lumbar spondylosis worsening since 2019, worst at L4-5, with facet arthropathy with joint effusion and periarticular edema and unchanged severe right neuroforaminal narrowing at L5-S1.  Neurosurgery evaluated patient March 2024 and did not recommend surgical approach at this point but continued PT, outpatient follow-up -PT recommends SNF, family in agreement  Scheduled Meds:  allopurinol  100 mg Oral BID   atorvastatin  10 mg Oral QHS   busPIRone  5 mg Oral BID   DULoxetine  40 mg Oral Daily   ferric citrate  420 mg Oral TID WC   folic acid  1 mg Oral Daily   Gerhardt's butt cream   Topical BID   heparin sodium (porcine)  3,800 Units Intracatheter Once   insulin  aspart  0-6 Units Subcutaneous TID WC   insulin aspart  2 Units Subcutaneous TID WC   insulin glargine-yfgn  8 Units Subcutaneous QHS   leptospermum manuka honey  1 Application Topical Daily   midodrine  5 mg Oral TID WC    mirtazapine  7.5 mg Oral QHS   pantoprazole  40 mg Oral BID AC   Continuous Infusions:  sodium chloride 20 mL/hr at 12/21/22 1732     PRN Meds:.acetaminophen, albuterol, alteplase, haloperidol lactate, heparin, HYDROmorphone (DILAUDID) injection, HYDROmorphone, lidocaine (PF), lidocaine-prilocaine, ondansetron (ZOFRAN) IV, pentafluoroprop-tetrafluoroeth  Current Outpatient Medications  Medication Instructions   albuterol (PROVENTIL) 2.5 mg, Nebulization, Every 6 hours PRN   allopurinol (ZYLOPRIM) 100 mg, Oral, 2 times daily   apixaban (ELIQUIS) 5 mg, Oral, 2 times daily   atorvastatin (LIPITOR) 10 mg, Oral, Daily   busPIRone (BUSPAR) 5 mg, Oral, 2 times daily   carvedilol (COREG) 12.5 mg, Oral, 2 times daily   cetirizine (ZYRTEC) 10 mg, Oral, Daily   colchicine 0.3 mg, Oral, 2 times weekly   doxercalciferol (HECTOROL) 4 mcg, Intravenous, Every M-W-F (Hemodialysis)   EASY COMFORT PEN NEEDLES 31G X 5 MM MISC USE 3 TIMES A DAY FOR INSULIN ADMINISTRATION   famotidine (PEPCID) 20 mg, Oral, Daily   ferric citrate (AURYXIA) 420 mg, Oral, 3 times daily with meals   fluticasone (FLONASE) 50 MCG/ACT nasal spray 2 sprays, Each Nare, Daily PRN   gabapentin (NEURONTIN) 100 mg, Oral, 3 times daily   hydrALAZINE (APRESOLINE) 50 mg, Oral, 3 times daily   HYDROmorphone (DILAUDID) 4 mg, Oral, 3 times daily PRN, Three times a day as needed 20 days out of the month.   insulin lispro (HUMALOG) 100 UNIT/ML KwikPen Before each meal 3 times a day, 140-199 - 2 units, 200-250 - 6 units, 251-299 - 8 units,  300-349 - 12 units,  350 or above 14 units.   isosorbide mononitrate (IMDUR) 60 mg, Oral, Daily   midodrine (PROAMATINE) 5 mg, Oral, 3 times daily with meals   mirtazapine (REMERON) 15 mg, Oral, Daily at bedtime   pantoprazole (PROTONIX) 40 mg, Oral, 2 times daily   spironolactone (ALDACTONE) 12.5 mg, Oral, Daily   torsemide (DEMADEX) 50 mg, Oral, 2 times daily    Diet Orders (From admission, onward)      Start     Ordered   12/21/22 1621  Diet NPO time specified  Diet effective now        12/21/22 1620            DVT prophylaxis: SCDs Start: 12/14/22 2037   Lab Results  Component Value Date   PLT 430 (H) 12/22/2022      Code Status: Full Code  Family Communication: female at bedside  Status is: Inpatient Remains inpatient appropriate because: EGD/colonoscopy/osteomyelitis  Level of care: Telemetry Medical  Consultants:  Nephrology GI  Objective: Vitals:   12/22/22 0920 12/22/22 0930 12/22/22 1000 12/22/22 1030  BP: (!) 159/86 (!) 159/103  (!) 156/83  Pulse: (!) 107 (!) 120 (!) 109 (!) 107  Resp: 11 (!) 22 11 15   Temp:      TempSrc:      SpO2: 100% 100% 100% 100%  Weight:      Height:        Intake/Output Summary (Last 24 hours) at 12/22/2022 1038 Last data filed at 12/21/2022 1700 Gross per 24 hour  Intake 480 ml  Output 1500 ml  Net -1020 ml   Wt Readings from Last 3  Encounters:  12/22/22 122.4 kg  11/28/22 127.5 kg  10/19/22 132.9 kg    Examination:   General: Appearance:    Severely obese female who appears uncomfortable     Lungs:      respirations unlabored  Heart:    Tachycardic.   MS:   All extremities are intact.   Neurologic:   Awake, alert   Data Reviewed: I have independently reviewed following labs and imaging studies   CBC Recent Labs  Lab 12/17/22 0848 12/18/22 1746 12/20/22 0259 12/21/22 0820 12/22/22 0701  WBC 22.4* 23.9* 21.7* 22.9* 25.7*  HGB 7.2* 7.8* 6.9* 7.8* 7.3*  HCT 23.9* 25.9* 23.8* 25.8* 24.1*  PLT 446* 439* 448* 492* 430*  MCV 93.7 95.6 95.6 94.5 93.1  MCH 28.2 28.8 27.7 28.6 28.2  MCHC 30.1 30.1 29.0* 30.2 30.3  RDW 17.8* 17.3* 17.1* 17.3* 17.2*  LYMPHSABS  --   --  1.8  --   --   MONOABS  --   --  0.9  --   --   EOSABS  --   --  0.4  --   --   BASOSABS  --   --  0.1  --   --     Recent Labs  Lab 12/17/22 0848 12/20/22 0259 12/21/22 0820 12/21/22 1342 12/22/22 0701  NA 136 137 135  --   135  K 4.2 3.5 3.4*  --  3.3*  CL 99 97* 97*  --  98  CO2 19* 23 26  --  26  GLUCOSE 94 61* 108*  --  100*  BUN 45* 34* 36*  --  20  CREATININE 5.98* 5.58* 5.61*  --  3.93*  CALCIUM 7.9* 8.1* 8.3*  --  8.2*  AST 8* 7*  --   --  8*  ALT 13  --   --   --  7  ALKPHOS 97 87  --   --  79  BILITOT 0.8  --   --   --  0.9  ALBUMIN 1.8* 1.8* 1.9*  --  1.8*  MG 1.8 1.5*  --   --  1.8  CRP  --   --   --  14.8*  --     ------------------------------------------------------------------------------------------------------------------ No results for input(s): "CHOL", "HDL", "LDLCALC", "TRIG", "CHOLHDL", "LDLDIRECT" in the last 72 hours.  Lab Results  Component Value Date   HGBA1C 8.1 (H) 06/19/2022   ------------------------------------------------------------------------------------------------------------------ No results for input(s): "TSH", "T4TOTAL", "T3FREE", "THYROIDAB" in the last 72 hours.  Invalid input(s): "FREET3"  Cardiac Enzymes No results for input(s): "CKMB", "TROPONINI", "MYOGLOBIN" in the last 168 hours.  Invalid input(s): "CK" ------------------------------------------------------------------------------------------------------------------    Component Value Date/Time   BNP 368.9 (H) 12/14/2022 1700    CBG: Recent Labs  Lab 12/20/22 1644 12/20/22 2231 12/21/22 1306 12/21/22 1709 12/21/22 2108  GLUCAP 107* 152* 98 99 86    No results found for this or any previous visit (from the past 240 hour(s)).   Radiology Studies: No results found.   Marlin Canary DO [facility]  Between 7 am - 7 pm I am available, please contact me via Amion (for emergencies) or Securechat (non urgent messages)  Between 7 pm - 7 am I am not available, please contact night coverage MD/APP via Amion

## 2022-12-22 NOTE — Progress Notes (Signed)
Pt dranked 1/2 of bowel prep with much encouragement. Several liquid brown stools(incontinent) this shift.

## 2022-12-22 NOTE — Progress Notes (Signed)
Received patient in bed to unit.  Alert and oriented.  Informed consent signed and in chart.   TX duration:2 hours and 16 minutes.  Terminated early due to cartridge clotting off, Dr. Glenna Fellows notified, and Dr. Glenna Fellows ok for her to be off due to her having dialysis yesterday morning   Patient tolerated well.  Transported back to the room  Alert, without acute distress.  Hand-off given to patient's nurse.   Access used: R HD Cath Access issues: Session terminated early due to cartridge clotting off.  Total UF removed: Medication(s) given: Dilaudid and midodrine   12/22/22 1130  Vitals  Temp 97.6 F (36.4 C)  BP (!) 149/90  MAP (mmHg) 107  Pulse Rate (!) 105  ECG Heart Rate (!) 105  Resp 14  Oxygen Therapy  SpO2 99 %  O2 Device Room Air  During Treatment Monitoring  HD Safety Checks Performed Yes  Intra-Hemodialysis Comments  (Dr. Glenna Fellows ok to leave patient off due to patient having dialysis yesterday.)  Dialysis Fluid Bolus Normal Saline  Bolus Amount (mL) 300 mL  Post Treatment  Fluid Removed (mL) 100 mL  Hemodialysis Catheter Right Subclavian Double lumen Permanent (Tunneled)  Placement Date/Time: 06/24/22 1644   Serial / Lot #: [id number]  Expiration Date: 09/08/26  Time Out: Correct patient;Correct site;Correct procedure  Maximum sterile barrier precautions: Hand hygiene;Mask;Cap;Sterile gloves;Sterile gown;Large sterile ...  Site Condition No complications  Blue Lumen Status Heparin locked;Dead end cap in place;Flushed  Red Lumen Status Heparin locked;Dead end cap in place;Flushed  Purple Lumen Status N/A  Catheter fill solution Heparin 1000 units/ml  Catheter fill volume (Arterial) 1.9 cc  Catheter fill volume (Venous) 1.9  Dressing Type Transparent  Dressing Status Antimicrobial disc in place;Clean, Dry, Intact  Interventions  (deaccessed)  Drainage Description None  Dressing Change Due 12/28/22  Post treatment catheter status Capped and Clamped      Stacie Glaze LPN [facility]

## 2022-12-22 NOTE — Progress Notes (Signed)
[facility] Progress Note   Subjective:    Seen and examined at bedside in dialysis.  C/o ongoing severe abd pain - crying.  Drank 50% of bowel prep for colo today per nursing report.    Objective Vitals:   12/22/22 0742 12/22/22 0809 12/22/22 0810 12/22/22 0824  BP: 125/68 (!) 158/120  (!) 128/113  Pulse: 98   (!) 114  Resp: 16 10  (!) 23  Temp: (!) 97.3 F (36.3 C) 98.5 F (36.9 C)    TempSrc: Oral Oral    SpO2: 100% 100%  100%  Weight:   122.4 kg   Height:       Physical Exam General:chronically ill appearing female writhing around, wailing at times Heart:RRR, no mrg Lungs:CTAB, nml WOB on RA Abdomen:soft, diffusely TTP but no rebound or guarding Extremities: trace bilateral pedal edema, large malodorous wound on R heel w/drainage Dialysis Access: [facility]   Filed Weights   12/21/22 0500 12/21/22 0809 12/22/22 0810  Weight: 129.4 kg 126.1 kg 122.4 kg    Intake/Output Summary (Last 24 hours) at 12/22/2022 0832 Last data filed at 12/21/2022 1700 Gross per 24 hour  Intake 480 ml  Output 1500 ml  Net -1020 ml    Additional Objective Labs: Basic Metabolic Panel: Recent Labs  Lab 12/20/22 0259 12/21/22 0820 12/22/22 0701  NA 137 135 135  K 3.5 3.4* 3.3*  CL 97* 97* 98  CO2 23 26 26   GLUCOSE 61* 108* 100*  BUN 34* 36* 20  CREATININE 5.58* 5.61* 3.93*  CALCIUM 8.1* 8.3* 8.2*  PHOS 5.7* 5.0*  --    Liver Function Tests: Recent Labs  Lab 12/17/22 0848 12/20/22 0259 12/21/22 0820 12/22/22 0701  AST 8* 7*  --  8*  ALT 13  --   --  7  ALKPHOS 97 87  --  79  BILITOT 0.8  --   --  0.9  PROT 6.0* 6.0*  --  5.9*  ALBUMIN 1.8* 1.8* 1.9* 1.8*   No results for input(s): "LIPASE", "AMYLASE" in the last 168 hours.  CBC: Recent Labs  Lab 12/17/22 0848 12/18/22 1746 12/20/22 0259 12/21/22 0820 12/22/22 0701  WBC 22.4* 23.9* 21.7* 22.9* 25.7*  NEUTROABS  --   --  18.3*  --   --   HGB 7.2* 7.8* 6.9* 7.8* 7.3*  HCT 23.9* 25.9* 23.8* 25.8*  24.1*  MCV 93.7 95.6 95.6 94.5 93.1  PLT 446* 439* 448* 492* 430*   Blood Culture    Component Value Date/Time   SDES BLOOD SITE NOT SPECIFIED 11/23/2022 0023   SPECREQUEST  11/23/2022 0023    BOTTLES DRAWN AEROBIC AND ANAEROBIC Blood Culture adequate volume   CULT  11/23/2022 0023    NO GROWTH 5 DAYS Performed at [facility], [address] [place], [place] [address]    REPTSTATUS 11/28/2022 FINAL 11/23/2022 0023    Cardiac Enzymes: No results for input(s): "CKTOTAL", "CKMB", "CKMBINDEX", "TROPONINI" in the last 168 hours. CBG: Recent Labs  Lab 12/20/22 1644 12/20/22 2231 12/21/22 1306 12/21/22 1709 12/21/22 2108  GLUCAP 107* 152* 98 99 86   Iron Studies: No results for input(s): "IRON", "TIBC", "TRANSFERRIN", "FERRITIN" in the last 72 hours. Lab Results  Component Value Date   INR 1.9 (H) 12/15/2022   INR 1.8 (H) 11/23/2022   INR 1.1 10/08/2020   Studies/Results: No results found.  Medications:  sodium chloride 20 mL/hr at 12/21/22 1732    allopurinol  100 mg Oral BID  atorvastatin  10 mg Oral QHS   busPIRone  5 mg Oral BID   DULoxetine  40 mg Oral Daily   ferric citrate  420 mg Oral TID WC   folic acid  1 mg Oral Daily   Gerhardt's butt cream   Topical BID   heparin sodium (porcine)  3,800 Units Intracatheter Once   insulin aspart  0-6 Units Subcutaneous TID WC   insulin aspart  2 Units Subcutaneous TID WC   insulin glargine-yfgn  8 Units Subcutaneous QHS   leptospermum manuka honey  1 Application Topical Daily   midodrine  5 mg Oral TID WC   mirtazapine  7.5 mg Oral QHS   pantoprazole  40 mg Oral BID AC    Dialysis Orders: [facility]WF [facility]   F180   400/800   3K/2.5 bath  126.9kg   RDC   - mircera 225 mcg last 10/5 - hectorol 7 mcg   Assessment/Plan: ESRD: under dialyzed as of late given missed and shorten treatments. Had her Monday treatment rolled over to Tues and today is her Wed tx.  Chronic Wounds - on R heel and LE. Drainage coming from  heel. Wound care following.  AMS - MRI head completed with no acute changes. Per PMD Volume - does not look volume overloaded. UF as tolerated.  Hypertension - BP in goal.  Cont home antiHTN regimen. Acute on Chronic Anemia esrd - requiring transfusion this admission. GI to scope today.  Eliquis on hold currently.  On ESA as well.  Secondary Hyperparathyroidism/Hyperphosphatemia - Corrected Ca okay.  Phos elevated.  Continue binders and VDRA. Vascular access - TDC in place Abdominal pain: CT scan without acute concern. Management per primary team. Gi resumed reglan.   Estill Bakes MD [facility] Pager [phone]

## 2022-12-22 NOTE — Plan of Care (Signed)
  Problem: Education: Goal: Understanding of CV disease, CV risk reduction, and recovery process will improve Outcome: Progressing Goal: Individualized Educational Video(s) Outcome: Progressing   Problem: Activity: Goal: Ability to return to baseline activity level will improve Outcome: Progressing   Problem: Cardiovascular: Goal: Ability to achieve and maintain adequate cardiovascular perfusion will improve Outcome: Progressing Goal: Vascular access site(s) Level 0-1 will be maintained Outcome: Progressing   Problem: Health Behavior/Discharge Planning: Goal: Ability to safely manage health-related needs after discharge will improve Outcome: Progressing   Problem: Education: Goal: Ability to describe self-care measures that may prevent or decrease complications (Diabetes Survival Skills Education) will improve Outcome: Progressing Goal: Individualized Educational Video(s) Outcome: Progressing   Problem: Coping: Goal: Ability to adjust to condition or change in health will improve Outcome: Progressing   Problem: Fluid Volume: Goal: Ability to maintain a balanced intake and output will improve Outcome: Progressing   Problem: Health Behavior/Discharge Planning: Goal: Ability to identify and utilize available resources and services will improve Outcome: Progressing Goal: Ability to manage health-related needs will improve Outcome: Progressing   Problem: Metabolic: Goal: Ability to maintain appropriate glucose levels will improve Outcome: Progressing   Problem: Nutritional: Goal: Maintenance of adequate nutrition will improve Outcome: Progressing Goal: Progress toward achieving an optimal weight will improve Outcome: Progressing   Problem: Skin Integrity: Goal: Risk for impaired skin integrity will decrease Outcome: Progressing   Problem: Tissue Perfusion: Goal: Adequacy of tissue perfusion will improve Outcome: Progressing   Problem: Education: Goal: Knowledge  of General Education information will improve Description: Including pain rating scale, medication(s)/side effects and non-pharmacologic comfort measures Outcome: Progressing   Problem: Health Behavior/Discharge Planning: Goal: Ability to manage health-related needs will improve Outcome: Progressing   Problem: Clinical Measurements: Goal: Ability to maintain clinical measurements within normal limits will improve Outcome: Progressing Goal: Will remain free from infection Outcome: Progressing Goal: Diagnostic test results will improve Outcome: Progressing Goal: Respiratory complications will improve Outcome: Progressing Goal: Cardiovascular complication will be avoided Outcome: Progressing   Problem: Activity: Goal: Risk for activity intolerance will decrease Outcome: Progressing   Problem: Nutrition: Goal: Adequate nutrition will be maintained Outcome: Progressing   Problem: Coping: Goal: Level of anxiety will decrease Outcome: Progressing   Problem: Elimination: Goal: Will not experience complications related to bowel motility Outcome: Progressing Goal: Will not experience complications related to urinary retention Outcome: Progressing   Problem: Pain Managment: Goal: General experience of comfort will improve Outcome: Progressing   Problem: Safety: Goal: Ability to remain free from injury will improve Outcome: Progressing   Problem: Skin Integrity: Goal: Risk for impaired skin integrity will decrease Outcome: Progressing

## 2022-12-23 ENCOUNTER — Inpatient Hospital Stay (HOSPITAL_COMMUNITY): Payer: 59 | Admitting: Anesthesiology

## 2022-12-23 ENCOUNTER — Encounter (HOSPITAL_COMMUNITY)
Admission: EM | Disposition: A | Discharge status: Skilled Nursing Facility | Payer: Self-pay | Source: Home / Self Care | Attending: Internal Medicine

## 2022-12-23 ENCOUNTER — Encounter (HOSPITAL_COMMUNITY): Payer: Self-pay | Admitting: Internal Medicine

## 2022-12-23 DIAGNOSIS — E1122 Type 2 diabetes mellitus with diabetic chronic kidney disease: Secondary | ICD-10-CM

## 2022-12-23 DIAGNOSIS — D649 Anemia, unspecified: Secondary | ICD-10-CM | POA: Diagnosis not present

## 2022-12-23 DIAGNOSIS — K31811 Angiodysplasia of stomach and duodenum with bleeding: Secondary | ICD-10-CM | POA: Diagnosis not present

## 2022-12-23 DIAGNOSIS — N189 Chronic kidney disease, unspecified: Secondary | ICD-10-CM

## 2022-12-23 DIAGNOSIS — I129 Hypertensive chronic kidney disease with stage 1 through stage 4 chronic kidney disease, or unspecified chronic kidney disease: Secondary | ICD-10-CM

## 2022-12-23 DIAGNOSIS — K209 Esophagitis, unspecified without bleeding: Secondary | ICD-10-CM

## 2022-12-23 DIAGNOSIS — K317 Polyp of stomach and duodenum: Secondary | ICD-10-CM | POA: Diagnosis not present

## 2022-12-23 DIAGNOSIS — K31819 Angiodysplasia of stomach and duodenum without bleeding: Secondary | ICD-10-CM

## 2022-12-23 HISTORY — PX: HOT HEMOSTASIS: SHX5433

## 2022-12-23 HISTORY — PX: GIVENS CAPSULE STUDY: SHX5432

## 2022-12-23 HISTORY — PX: ESOPHAGOGASTRODUODENOSCOPY (EGD) WITH PROPOFOL: SHX5813

## 2022-12-23 LAB — GLUCOSE, CAPILLARY
Glucose-Capillary: 76 mg/dL (ref 70–99)
Glucose-Capillary: 82 mg/dL (ref 70–99)
Glucose-Capillary: 250 mg/dL — ABNORMAL HIGH (ref 70–99)
Glucose-Capillary: 215 mg/dL — ABNORMAL HIGH (ref 70–99)

## 2022-12-23 SURGERY — ESOPHAGOGASTRODUODENOSCOPY (EGD) WITH PROPOFOL
Anesthesia: Monitor Anesthesia Care

## 2022-12-23 MED ORDER — PROSOURCE PLUS PO LIQD
30.0000 mL | Freq: Two times a day (BID) | ORAL | Status: DC
  Administered 2022-12-23 – 2023-01-08 (×7): 30 mL via ORAL
  Filled 2022-12-23 (×16): qty 30

## 2022-12-23 MED ORDER — BUSPIRONE HCL 5 MG PO TABS
10.0000 mg | ORAL_TABLET | Freq: Two times a day (BID) | ORAL | Status: DC
  Administered 2022-12-23 – 2023-01-01 (×14): 10 mg via ORAL
  Filled 2022-12-23 (×17): qty 2

## 2022-12-23 MED ORDER — DARBEPOETIN ALFA 200 MCG/0.4ML IJ SOSY
200.0000 ug | PREFILLED_SYRINGE | INTRAMUSCULAR | Status: DC
  Administered 2022-12-24 – 2023-01-07 (×3): 200 ug via SUBCUTANEOUS
  Filled 2022-12-23 (×3): qty 0.4

## 2022-12-23 MED ORDER — PROPOFOL 10 MG/ML IV BOLUS
INTRAVENOUS | Status: DC | PRN
Start: 2022-12-23 — End: 2022-12-23
  Administered 2022-12-23 (×3): 20 mg via INTRAVENOUS

## 2022-12-23 MED ORDER — SODIUM CHLORIDE 0.9 % IV SOLN
INTRAVENOUS | Status: DC | PRN
Start: 2022-12-23 — End: 2022-12-23

## 2022-12-23 MED ORDER — PROPOFOL 500 MG/50ML IV EMUL
INTRAVENOUS | Status: DC | PRN
Start: 2022-12-23 — End: 2022-12-23
  Administered 2022-12-23: 125 ug/kg/min via INTRAVENOUS

## 2022-12-23 SURGICAL SUPPLY — 25 items
BLOCK BITE 60FR ADLT L/F BLUE (MISCELLANEOUS) ×1 IMPLANT
ELECT REM PT RETURN 9FT ADLT (ELECTROSURGICAL)
ELECTRODE REM PT RTRN 9FT ADLT (ELECTROSURGICAL) IMPLANT
FCP BXJMBJMB 240X2.8X (CUTTING FORCEPS)
FLOOR PAD 36X40 (MISCELLANEOUS) ×1
FORCEP RJ3 GP 1.8X160 W-NEEDLE (CUTTING FORCEPS) IMPLANT
FORCEPS BIOP RAD 4 LRG CAP 4 (CUTTING FORCEPS) IMPLANT
FORCEPS BIOP RJ4 240 W/NDL (CUTTING FORCEPS)
FORCEPS BXJMBJMB 240X2.8X (CUTTING FORCEPS) IMPLANT
INJECTOR/SNARE I SNARE (MISCELLANEOUS) IMPLANT
LUBRICANT JELLY 4.5OZ STERILE (MISCELLANEOUS) IMPLANT
MANIFOLD NEPTUNE II (INSTRUMENTS) IMPLANT
NDL SCLEROTHERAPY 25GX240 (NEEDLE) IMPLANT
NEEDLE SCLEROTHERAPY 25GX240 (NEEDLE) IMPLANT
PAD FLOOR 36X40 (MISCELLANEOUS) ×1 IMPLANT
PROBE APC STR FIRE (PROBE) IMPLANT
PROBE INJECTION GOLD (MISCELLANEOUS)
PROBE INJECTION GOLD 7FR (MISCELLANEOUS) IMPLANT
SNARE ROTATE MED OVAL 20MM (MISCELLANEOUS) IMPLANT
SNARE SHORT THROW 13M SML OVAL (MISCELLANEOUS) IMPLANT
SYR 50ML LL SCALE MARK (SYRINGE) IMPLANT
TRAP SPECIMEN MUCOUS 40CC (MISCELLANEOUS) IMPLANT
TUBING ENDO SMARTCAP PENTAX (MISCELLANEOUS) ×2 IMPLANT
TUBING IRRIGATION ENDOGATOR (MISCELLANEOUS) ×1 IMPLANT
WATER STERILE IRR 1000ML POUR (IV SOLUTION) IMPLANT

## 2022-12-23 NOTE — Anesthesia Procedure Notes (Signed)
Procedure Name: MAC Date/Time: 12/23/2022 8:30 AM  Performed by: Colon Flattery, CRNAPre-anesthesia Checklist: Patient identified, Emergency Drugs available, Suction available and Patient being monitored Patient Re-evaluated:Patient Re-evaluated prior to induction Oxygen Delivery Method: Nasal cannula Preoxygenation: Pre-oxygenation with 100% oxygen Induction Type: IV induction Dental Injury: Teeth and Oropharynx as per pre-operative assessment

## 2022-12-23 NOTE — Op Note (Signed)
[facility] Patient Name: Deborah [name] Procedure Date : 12/23/2022 MRN: [id number] Attending MD: Doristine Locks , MD, [id number] Date of Birth: [birth year] [name]: [id number] Age: 57 Admit Type: Inpatient Procedure:                Upper GI endoscopy Indications:              Acute on chronic anemia Providers:                Doristine Locks, MD, Jacquelyn "Jaci" Clelia Croft, RN,                            Salley Scarlet, Technician, Baker Pierini, CRNA Referring MD:              Medicines:                Monitored Anesthesia Care Complications:            No immediate complications. Estimated Blood Loss:     Estimated blood loss was minimal. Procedure:                Pre-Anesthesia Assessment:                           - Prior to the procedure, a History and Physical                            was performed, and patient medications and                            allergies were reviewed. The patient's tolerance of                            previous anesthesia was also reviewed. The risks                            and benefits of the procedure and the sedation                            options and risks were discussed with the patient.                            All questions were answered, and informed consent                            was obtained. Prior Anticoagulants: The patient has                            taken Eliquis (apixaban), last dose was 3 days                            prior to procedure. ASA Grade Assessment: III - A                            patient with severe systemic disease. After  reviewing the risks and benefits, the patient was                            deemed in satisfactory condition to undergo the                            procedure.                           After obtaining informed consent, the endoscope was                            passed under direct vision. Throughout the                             procedure, the patient's blood pressure, pulse, and                            oxygen saturations were monitored continuously. The                            [id number]-[id number] ([id number]) Olympus endoscope was introduced                            through the mouth, and advanced to the third part                            of duodenum. The upper GI endoscopy was                            accomplished without difficulty. The patient                            tolerated the procedure well. Scope In: Scope Out: Findings:      The upper third of the esophagus and middle third of the esophagus were       normal.      LA Grade A (one or more mucosal breaks less than 5 mm, not extending       between tops of 2 mucosal folds) esophagitis with no bleeding was found       at the gastroesophageal junction.      Three small angioectasias were found in the gastric fundus and in the       gastric body. One of the AVMs in the gastric body had a small overlying       clot. This was easily lavaged off to reveal active oozing. Coagulation       for hemostasis using argon plasma was successful. Estimated blood loss       was minimal.      Inflammation characterized by contact friability was found in the       gastric body. There was mild contact oozing in several locations in the       gastric body. Coagulation for hemostasis using argon plasma was       successful. I elected to not perform gastric biopsies to not confound       results of the video capsule  endoscopy.      The examined duodenum was normal. Using the endoscope, the video capsule       enteroscope was advanced into the duodenal bulb. Impression:               - Normal upper third of esophagus and middle third                            of esophagus.                           - LA Grade A esophagitis with no bleeding.                           - Three bleeding angioectasias in the stomach.                            Treated with argon plasma  coagulation (APC).                           - Gastritis. Treated with argon plasma coagulation                            (APC).                           - Normal examined duodenum.                           - Successful completion of the Video Capsule                            Enteroscope placement.                           - No specimens collected.                           - Colonoscopy not performed today due to solid                            stools. Recommendation:           - Return patient to hospital ward for ongoing care.                           - NPO for 2 hours, then advance to clears for 2                            hours, then can advance diet from there per VCE                            protocol.                           - Continue present medications.                           -  Use Protonix (pantoprazole) 40 mg PO BID for 6                            weeks.                           - Use sucralfate suspension 1 gram PO QID for 1                            week, then 1 gram BID for 1 week, then discontinue                            (patient has ESRD).                           - Will follow-up VCE images tomorrow. Procedure Code(s):        --- Professional ---                           [phone], Esophagogastroduodenoscopy, flexible,                            transoral; with control of bleeding, any method Diagnosis Code(s):        --- Professional ---                           K20.90, Esophagitis, unspecified without bleeding                           K31.811, Angiodysplasia of stomach and duodenum                            with bleeding                           K29.70, Gastritis, unspecified, without bleeding                           D62, Acute posthemorrhagic anemia CPT copyright 2022 American [employer] Association. All rights reserved. The codes documented in this report are preliminary and upon coder review may  be revised to meet current compliance  requirements. Doristine Locks, MD 12/23/2022 9:20:04 AM Number of Addenda: 0

## 2022-12-23 NOTE — Plan of Care (Signed)
  Problem: Cardiovascular: Goal: Ability to achieve and maintain adequate cardiovascular perfusion will improve Outcome: Not Progressing   Problem: Coping: Goal: Ability to adjust to condition or change in health will improve Outcome: Not Progressing   Problem: Tissue Perfusion: Goal: Adequacy of tissue perfusion will improve Outcome: Not Progressing   Problem: Elimination: Goal: Will not experience complications related to bowel motility Outcome: Progressing Goal: Will not experience complications related to urinary retention Outcome: Not Applicable

## 2022-12-23 NOTE — Anesthesia Postprocedure Evaluation (Signed)
Anesthesia Post Note  Patient: Deborah [name]  Procedure(s) Performed: ESOPHAGOGASTRODUODENOSCOPY (EGD) WITH PROPOFOL HOT HEMOSTASIS (ARGON PLASMA COAGULATION/BICAP) GIVENS CAPSULE STUDY     Patient location during evaluation: PACU Anesthesia Type: MAC Level of consciousness: awake and alert Pain management: pain level controlled Vital Signs Assessment: post-procedure vital signs reviewed and stable Respiratory status: spontaneous breathing, nonlabored ventilation and respiratory function stable Cardiovascular status: blood pressure returned to baseline and stable Postop Assessment: no apparent nausea or vomiting Anesthetic complications: no   No notable events documented.  Last Vitals:  Vitals:   12/23/22 0700 12/23/22 0909  BP: (!) 129/57 (!) 112/53  Pulse: 98 (!) 122  Resp: 16 18  Temp: 36.4 C 36.6 C  SpO2: 98% 92%    Last Pain:  Vitals:   12/23/22 0909  TempSrc: Temporal  PainSc: 0-No pain                 Lannie Fields

## 2022-12-23 NOTE — Anesthesia Preprocedure Evaluation (Addendum)
Anesthesia Evaluation  Patient identified by MRN, date of birth, ID band Patient awake    Reviewed: Allergy & Precautions, H&P , NPO status , Patient's Chart, lab work & pertinent test results  Airway Mallampati: III  TM Distance: >3 FB Neck ROM: Full    Dental  (+) Teeth Intact, Dental Advisory Given, Poor Dentition   Pulmonary neg pulmonary ROS   Pulmonary exam normal breath sounds clear to auscultation       Cardiovascular hypertension (118/99 preop), Pt. on medications Normal cardiovascular exam Rhythm:Regular Rate:Normal  Echo  1. Left ventricular ejection fraction, by estimation, is 55 to 60%. The  left ventricle has normal function. Left ventricular diastolic parameters  are consistent with Grade I diastolic dysfunction (impaired relaxation).   2. Right ventricular systolic function is normal. The right ventricular  size is normal.   3. The mitral valve is normal in structure. No evidence of mitral valve  regurgitation.   4. The aortic valve is tricuspid.   5. The inferior vena cava is normal in size with greater than 50%  respiratory variability, suggesting right atrial pressure of 3 mmHg.     Neuro/Psych  PSYCHIATRIC DISORDERS Anxiety Depression    CVA (2012, R sided weakness), Residual Symptoms    GI/Hepatic Neg liver ROS, PUD,GERD  Controlled,,Gastroparesis    Endo/Other  diabetes, Poorly Controlled, Type 2, Insulin Dependent  Morbid obesityBMI 43 A1c 8.1  Renal/GU Renal Insufficiency and CRFRenal diseaseCr 3.93  negative genitourinary   Musculoskeletal  (+)  Fibromyalgia -  Abdominal  (+) + obese  Peds negative pediatric ROS (+)  Hematology  (+) Blood dyscrasia, anemia Hb 7.3, plt 430   Anesthesia Other Findings   Reproductive/Obstetrics negative OB ROS                             Anesthesia Physical Anesthesia Plan  ASA: 3  Anesthesia Plan: MAC   Post-op Pain  Management:    Induction:   PONV Risk Score and Plan: 2 and Propofol infusion and TIVA  Airway Management Planned: Natural Airway and Simple Face Mask  Additional Equipment: None  Intra-op Plan:   Post-operative Plan:   Informed Consent: I have reviewed the patients History and Physical, chart, labs and discussed the procedure including the risks, benefits and alternatives for the proposed anesthesia with the patient or authorized representative who has indicated his/her understanding and acceptance.       Plan Discussed with: CRNA  Anesthesia Plan Comments:        Anesthesia Quick Evaluation

## 2022-12-23 NOTE — Plan of Care (Signed)
  Problem: Metabolic: Goal: Ability to maintain appropriate glucose levels will improve Outcome: Progressing   Problem: Skin Integrity: Goal: Risk for impaired skin integrity will decrease Outcome: Progressing   Problem: Tissue Perfusion: Goal: Adequacy of tissue perfusion will improve Outcome: Progressing   Problem: Education: Goal: Knowledge of General Education information will improve Description: Including pain rating scale, medication(s)/side effects and non-pharmacologic comfort measures Outcome: Progressing

## 2022-12-23 NOTE — Progress Notes (Signed)
[facility] capsule endoscopy ordered by MD  Dr Barron Alvine.  Patient ingested capsule at  0900.  Per Given's capsule instructions, patient to remain NPO until  1100 at which time they may progress to clear liquid diet. At 1300 patient may have a small snack such as a half a sandwich or a bowl of soup. At 1700 patient may progress to previously ordered diet.  The capsule endoscopy study will conclude at 2100 at which time the recorder and leads or belt can be removed and placed in a patient belongings bag. Endoscopy staff will pick up the equipment in the AM.  Instructions provided to patient and inpatient RN. Patient and RN demonstrated understanding.

## 2022-12-23 NOTE — Progress Notes (Signed)
Patient encouraged to drank contrast all night for procedure this am. Patient refused to drank contrast only drank approximately 1/3 of contrast. Provider Haze Boyden NP notified and made aware. Will continue to encourage patient to drank contrast.

## 2022-12-23 NOTE — Transfer of Care (Signed)
Immediate Anesthesia Transfer of Care Note  Patient: LAPORSCHE HOEGER  Procedure(s) Performed: ESOPHAGOGASTRODUODENOSCOPY (EGD) WITH PROPOFOL HOT HEMOSTASIS (ARGON PLASMA COAGULATION/BICAP) GIVENS CAPSULE STUDY  Patient Location: Endoscopy Unit  Anesthesia Type:MAC  Level of Consciousness: awake, alert , and oriented  Airway & Oxygen Therapy: Patient Spontanous Breathing  Post-op Assessment: Report given to RN, Post -op Vital signs reviewed and stable, and Patient moving all extremities  Post vital signs: Reviewed and stable  Last Vitals:  Vitals Value Taken Time  BP 112/53 12/23/22 0910  Temp 36.6 C 12/23/22 0909  Pulse 107 12/23/22 0911  Resp 18 12/23/22 0911  SpO2 96 % 12/23/22 0911  Vitals shown include unfiled device data.  Last Pain:  Vitals:   12/23/22 0909  TempSrc: Temporal  PainSc: 0-No pain      Patients Stated Pain Goal: 0 (12/22/22 1418)  Complications: No notable events documented.

## 2022-12-23 NOTE — Progress Notes (Signed)
OT Cancellation Note  Patient Details Name: Deborah [name] MRN: [id number] DOB: 1965/07/20   Cancelled Treatment:    Reason Eval/Treat Not Completed: (P) Patient at procedure or test/ unavailable, will reattempt as able  Alexis Goodell 12/23/2022, 9:57 AM

## 2022-12-23 NOTE — Progress Notes (Signed)
Occupational Therapy Treatment Patient Details Name: JILLION CARRIE MRN: [id number] DOB: 1965/05/28 Today's Date: 12/23/2022   History of present illness The pt is a 57 yo female presenting 10/8 with weakness, diarrhea x4 days, nausea, vomiting, and AMS over last month. Work up revealed Hgb of 5.8, undergoing continued work up for GIB. PMH includes: prior cancer of duodenum, ESRD, GERD, obesity, diabetes, and chronic sacral wounds.   OT comments  Pt in good spirits today, very conversational and joking with staff. Pt sitting on EOB upon arrival with nursing, able to balance on EOB for 20+ minutes at least per nursing. Assisted Pt with BLEs back into bed with mod A, not able to scoot back without x2 assistance. Pt able to roll L/R with supervision using bed rails, bed pan places, Pt total A for toileting and hygiene at bed level. Pt would benefit from continued skilled therapy to maximize participation and improve functional independence. Pt DC plan still appropriate.       If plan is discharge home, recommend the following:  A lot of help with bathing/dressing/bathroom;Assistance with cooking/housework;A lot of help with walking and/or transfers   Equipment Recommendations  Other (comment) (defer)    Recommendations for Other Services      Precautions / Restrictions Precautions Precautions: Fall Precaution Comments: sacral wounds, bilateral heel wounds Restrictions Weight Bearing Restrictions: Yes RLE Weight Bearing: Touchdown weight bearing Other Position/Activity Restrictions: from last admission last month, "Per Dr Lajoyce Corners, ideally will be NWB on rt heel, but put as little weight as possible on rt foot'       Mobility Bed Mobility Overal bed mobility: Needs Assistance Bed Mobility: Rolling, Sit to Supine Rolling: Supervision, Used rails     Sit to supine: Mod assist, Used rails   General bed mobility comments: rolling is good with bed rails, mod A for BLEs sit to supine,  needs help scooting back in bed.    Transfers Overall transfer level: Needs assistance                 General transfer comment: did not attempt, Pt states she hasn't been able to stand to transfer due to BL heel wounds     Balance Overall balance assessment: Needs assistance Sitting-balance support: No upper extremity supported, Feet supported Sitting balance-Leahy Scale: Fair Sitting balance - Comments: sat on EOB for 20+ minutes     Standing balance-Leahy Scale: Zero                             ADL either performed or assessed with clinical judgement   ADL Overall ADL's : Needs assistance/impaired                 Upper Body Dressing : Minimal assistance;Sitting   Lower Body Dressing: Bed level;Total assistance       Toileting- Clothing Manipulation and Hygiene: Total assistance;Bed level         General ADL Comments: total A at bed level, able to roll L/R as needed using rails.    Extremity/Trunk Assessment Upper Extremity Assessment Upper Extremity Assessment: Overall WFL for tasks assessed            Vision       Perception     Praxis      Cognition Arousal: Alert Behavior During Therapy: WFL for tasks assessed/performed Overall Cognitive Status: Impaired/Different from baseline Area of Impairment: Following commands, Safety/judgement, Awareness  Current Attention Level: Focused   Following Commands: Follows one step commands inconsistently Safety/Judgement: Decreased awareness of safety Awareness: Intellectual   General Comments: Pt able to follow commands as need, able to relay needs, aware of deficits, very expressive, conversational, and humurous today.        Exercises      Shoulder Instructions       General Comments      Pertinent Vitals/ Pain       Pain Assessment Pain Assessment: Faces Faces Pain Scale: Hurts little more Pain Location: generalized and bil LE Pain  Descriptors / Indicators: Discomfort Pain Intervention(s): Monitored during session  Home Living                                          Prior Functioning/Environment              Frequency  Min 1X/week        Progress Toward Goals  OT Goals(current goals can now be found in the care plan section)  Progress towards OT goals: Progressing toward goals  Acute Rehab OT Goals Patient Stated Goal: to improve pain and wounds OT Goal Formulation: With patient/family Time For Goal Achievement: 01/02/23 Potential to Achieve Goals: Fair ADL Goals Pt Will Perform Grooming: with set-up;sitting Pt Will Perform Lower Body Bathing: with mod assist;sit to/from stand;with adaptive equipment Pt Will Perform Lower Body Dressing: with mod assist;sit to/from stand;with adaptive equipment Pt Will Transfer to Toilet: with mod assist;squat pivot transfer;bedside commode Pt Will Perform Toileting - Clothing Manipulation and hygiene: with mod assist;sit to/from stand Pt/caregiver will Perform Home Exercise Program: Both right and left upper extremity;Independently;Increased strength Additional ADL Goal #1: Pt will demonstrate intellectual awareness of situation with no more than mod questioning cueing. Additional ADL Goal #2: Pt will remain engaged for 75% of therapy session to progress with functional activity  Plan      Co-evaluation                 AM-PAC OT "6 Clicks" Daily Activity     Outcome Measure   Help from another person eating meals?: None Help from another person taking care of personal grooming?: A Lot Help from another person toileting, which includes using toliet, bedpan, or urinal?: Total Help from another person bathing (including washing, rinsing, drying)?: Total Help from another person to put on and taking off regular upper body clothing?: A Little Help from another person to put on and taking off regular lower body clothing?: Total 6 Click  Score: 12    End of Session    OT Visit Diagnosis: Unsteadiness on feet (R26.81);Other abnormalities of gait and mobility (R26.89);Muscle weakness (generalized) (M62.81);Other symptoms and signs involving cognitive function;Pain Pain - Right/Left: Right Pain - part of body: Ankle and joints of foot   Activity Tolerance Patient tolerated treatment well   Patient Left in bed;with call bell/phone within reach   Nurse Communication Mobility status        Time: 6213-0865 OT Time Calculation (min): 26 min  Charges: OT General Charges $OT Visit: 1 Visit OT Treatments $Self Care/Home Management : 23-37 mins  [address] OTR/L   Alexis Goodell 12/23/2022, 4:01 PM

## 2022-12-23 NOTE — Progress Notes (Signed)
[facility] Progress Note   Subjective:   Seen in room - s/p EGD with capsule placement this AM. Says still feels awful, no dyspnea. For HD tomorrow.  Objective Vitals:   12/23/22 0909 12/23/22 0920 12/23/22 0930 12/23/22 1141  BP: (!) 112/53 100/74 106/75 106/77  Pulse: (!) 122 (!) 105 (!) 107 100  Resp: 18 (!) 21 (!) 28 15  Temp: 97.9 F (36.6 C)   (!) 97.5 F (36.4 C)  TempSrc: Temporal     SpO2: 92% 99% 99% 100%  Weight:      Height:       Physical Exam General: Chronically ill appearing woman, NAD. Laying on side, scrolling on phone. Heart: RRR; no murmur Lungs: CTAB; no rales Abdomen: soft, diffusely TTP but no rebound or guarding Extremities: trace bilateral pedal edema, large malodorous wound on R heel w/drainage Dialysis Access: [facility]   Additional Objective Labs: Basic Metabolic Panel: Recent Labs  Lab 12/20/22 0259 12/21/22 0820 12/22/22 0701  NA 137 135 135  K 3.5 3.4* 3.3*  CL 97* 97* 98  CO2 23 26 26   GLUCOSE 61* 108* 100*  BUN 34* 36* 20  CREATININE 5.58* 5.61* 3.93*  CALCIUM 8.1* 8.3* 8.2*  PHOS 5.7* 5.0*  --    Liver Function Tests: Recent Labs  Lab 12/17/22 0848 12/20/22 0259 12/21/22 0820 12/22/22 0701  AST 8* 7*  --  8*  ALT 13  --   --  7  ALKPHOS 97 87  --  79  BILITOT 0.8  --   --  0.9  PROT 6.0* 6.0*  --  5.9*  ALBUMIN 1.8* 1.8* 1.9* 1.8*   CBC: Recent Labs  Lab 12/17/22 0848 12/18/22 1746 12/20/22 0259 12/21/22 0820 12/22/22 0701  WBC 22.4* 23.9* 21.7* 22.9* 25.7*  NEUTROABS  --   --  18.3*  --   --   HGB 7.2* 7.8* 6.9* 7.8* 7.3*  HCT 23.9* 25.9* 23.8* 25.8* 24.1*  MCV 93.7 95.6 95.6 94.5 93.1  PLT 446* 439* 448* 492* 430*   Medications:   allopurinol  100 mg Oral BID   atorvastatin  10 mg Oral QHS   busPIRone  10 mg Oral BID   DULoxetine  40 mg Oral Daily   ferric citrate  420 mg Oral TID WC   folic acid  1 mg Oral Daily   Gerhardt's butt cream   Topical BID   insulin aspart  0-6 Units  Subcutaneous TID WC   insulin aspart  2 Units Subcutaneous TID WC   insulin glargine-yfgn  8 Units Subcutaneous QHS   leptospermum manuka honey  1 Application Topical Daily   midodrine  5 mg Oral TID WC   mirtazapine  7.5 mg Oral QHS   pantoprazole  40 mg Oral BID AC   Dialysis Orders: [facility]   F[facility]   400/800   3K/2.5 bath  126.9kg   RDC   - mircera 225 mcg last 10/5 - hectorol 7 mcg   Assessment/Plan: ESRD: Back to usual MWF schedule - next HD tomorrow Chronic Wounds - on R heel and LE. Wound care following.  AMS (improving): MRI head completed with no acute changes. Per PMD BP/volume: BP stable, minimal edema on exam. Continue home meds, UF as tolerated. Below prior EDW - will need to lower on discharge. Acute on Chronic Anemia of ESRD: Hgb down to 7.3. S/p EGD and capsule endo today. Multiple gastric AVMs, treated with APC. Eliquis on hold. 2U PRBCs given  this admit. Due for ESA tomorrow, will order. Abdominal pain: CT scan without acute concern. GI resumed Reglan. Due to #5? Secondary Hyperparathyroidism/Hyperphosphatemia: CorrCa/Phos good, continue meds. Vascular access - TDC in place Nutrition: Alb very low, start supplements.  Ozzie Hoyle, PA-C 12/23/2022, 12:26 PM  [facility]

## 2022-12-23 NOTE — Progress Notes (Addendum)
Patient ID: Deborah [name], female   DOB: 25-Apr-1965, 57 y.o.   MRN: [id number] Readdressed with patient the osteomyelitis and infection in the right heel which is the extremity that is status post stroke.  Recommended proceeding with a below-knee amputation.  Patient was very tearful about the possibility of any type of surgery.  I discussed with her I would call her niece Deborah [name] to discuss surgical recommendations.  Plan for GI surgery today I will cancel surgery for tomorrow possibly surgery next week.  I spoke with patient's niece [name] and relayed the medical history and recommended proceeding with a below-knee amputation on Wednesday.  Patient's niece states that she will be with the patient this weekend and discussed recommendations regarding amputation surgery.

## 2022-12-23 NOTE — Interval H&P Note (Signed)
History and Physical Interval Note:  No acute events overnight.  No overt bleeding.  Did not complete prep again for second consecutive day.  Still with dark brown "slushy" stools.  No repeat lab this morning for review.  Will plan for EGD and endoscopic VCE placement with attempt at colonoscopy, but explained quality of bowel prep may limit the latter.  She understands and wishes to proceed as she has no intent of drinking more prep.  12/23/2022 7:46 AM  Deborah [name]  has presented today for surgery, with the diagnosis of Anemia.  The various methods of treatment have been discussed with the patient and family. After consideration of risks, benefits and other options for treatment, the patient has consented to  Procedure(s): ESOPHAGOGASTRODUODENOSCOPY (EGD) WITH PROPOFOL (N/A) and Colonoscopy as a surgical intervention.  The patient's history has been reviewed, patient examined, no change in status, stable for surgery.  I have reviewed the patient's chart and labs.  Questions were answered to the patient's satisfaction.     Verlin Dike Nichola Warren

## 2022-12-23 NOTE — Progress Notes (Signed)
PROGRESS NOTE  Deborah [name]:[id number] DOB: 1966-02-06 DOA: 12/14/2022 PCP: Pcp, No   LOS: 9 days   Brief Narrative / Interim history: 57 y.o. female with hx of ESRD on M/W/F HD, PAF on Eliquis, HFpEF, HTN, DM-2, malignant carcinoid, NAFLD, morbid obesity, chronic ulceration R ankle, recent admission 9/16 -24 initially suspected sepsis from SSTI but ultimately sepsis ruled out, and antibiotics were stopped. That admission underwent angiogram which showed adequate perfusion of the R foot. Treated for gout involving R ankle. Returns due to initial complaint of abd pain, per ED note 1 day history persistent abd pain. Had also missed last HD session, last session on 10/4. Found to have anemia, with Hb of 5.8. On further history, per RN report patients mother provided collateral of 1 week of bright red blood per rectum   Subjective / 24h Interval events: In HD-- c/o stomach pain   Assesement and Plan:  Acute on chronic anemia, concern for acute blood loss anemia due to lower GI bleed -gastroenterology consulted, appreciate input.  They saw her last week, and without overt bleeding recommended monitoring.  Hemoglobin has remained stable over the weekend, and upon resumption of her Eliquis hemoglobin dropped again to 6.9 -She was transfused a second unit of packed red blood cells on 10/14.Gastroenterology was consulted and she is s/p SAY:TKZSWF reflux esophagitis in the lower esophagus, normal-appearing duodenum. Had 3 small AVMs 1 with adherent clot that was washed off and revealed active bleeding   high-dose PPI to try to prophylax, but some degree of bleeding could be inevitable if she has to restart anticoagulation. Not a candidate for Carafate long-term given her ESRD. Can give for the next 1-2 weeks, then needs to stop.    ESRD -nephrology consulted, appreciate input.   - HD  10/16  A-fib-given new drop in hemoglobin with resumption of her Eliquis, hold Eliquis indefinitely for  now  Chronic right heel wound, concern for acute osteomyelitis-last month she underwent an MRI that did not show any osteomyelitis.  Given persistent leukocytosis I have repeated the MRI on 10/14 which does show now early osteomyelitis.  Dr. Lajoyce Corners consulted, appreciate input. -Nontoxic-appearing, hold antibiotics.  Needs at least a bone biopsy unless Dr. Lajoyce Corners offers an amputation and patient is agreeable- -he plans to speak with family again this weekend  Acute toxic encephalopathy, possibly due to opiate medication -monitor,  home Dilaudid dose.  MRI negative.    Leukocytosis-recently on steroids for gout.  She is afebrile, but MRI does show concerns for osteomyelitis to the right heel  Metastatic duodenal carcinoid-had resection in the past  Chronic diastolic CHF-euvolemic, volume management per HD  Chronic pain- home Dilaudid at a lower dose, IV PRN dilaudid for SEVERE pain  Hyperlipidemia, NAFLD-continue statin  Mood disorder-continue duloxetine, added diazepam  Gout-continue home allopurinol  Chronic wounds-wound care consult.  Right foot MRI done just 2 weeks ago without evidence of osteomyelitis  Chronic back pain, spinal stenosis, lumbar region with neurogenic claudication -now wheelchair-bound for several months, underwent an MRI of the lumbar spine earlier this year. It showed lower lumbar spondylosis worsening since 2019, worst at L4-5, with facet arthropathy with joint effusion and periarticular edema and unchanged severe right neuroforaminal narrowing at L5-S1.  Neurosurgery evaluated patient March 2024 and did not recommend surgical approach at this point but continued PT, outpatient follow-up -PT recommends SNF, family in agreement  Scheduled Meds:  allopurinol  100 mg Oral BID   atorvastatin  10 mg Oral QHS  busPIRone  10 mg Oral BID   DULoxetine  40 mg Oral Daily   ferric citrate  420 mg Oral TID WC   folic acid  1 mg Oral Daily   Gerhardt's butt cream   Topical BID    insulin aspart  0-6 Units Subcutaneous TID WC   insulin aspart  2 Units Subcutaneous TID WC   insulin glargine-yfgn  8 Units Subcutaneous QHS   leptospermum manuka honey  1 Application Topical Daily   midodrine  5 mg Oral TID WC   mirtazapine  7.5 mg Oral QHS   pantoprazole  40 mg Oral BID AC   Continuous Infusions:     PRN Meds:.acetaminophen, albuterol, haloperidol lactate, HYDROmorphone (DILAUDID) injection, HYDROmorphone, ondansetron (ZOFRAN) IV  Current Outpatient Medications  Medication Instructions   albuterol (PROVENTIL) 2.5 mg, Nebulization, Every 6 hours PRN   allopurinol (ZYLOPRIM) 100 mg, Oral, 2 times daily   apixaban (ELIQUIS) 5 mg, Oral, 2 times daily   atorvastatin (LIPITOR) 10 mg, Oral, Daily   busPIRone (BUSPAR) 5 mg, Oral, 2 times daily   carvedilol (COREG) 12.5 mg, Oral, 2 times daily   cetirizine (ZYRTEC) 10 mg, Oral, Daily   colchicine 0.3 mg, Oral, 2 times weekly   doxercalciferol (HECTOROL) 4 mcg, Intravenous, Every M-W-F (Hemodialysis)   EASY COMFORT PEN NEEDLES 31G X 5 MM MISC USE 3 TIMES A DAY FOR INSULIN ADMINISTRATION   famotidine (PEPCID) 20 mg, Oral, Daily   ferric citrate (AURYXIA) 420 mg, Oral, 3 times daily with meals   fluticasone (FLONASE) 50 MCG/ACT nasal spray 2 sprays, Each Nare, Daily PRN   gabapentin (NEURONTIN) 100 mg, Oral, 3 times daily   hydrALAZINE (APRESOLINE) 50 mg, Oral, 3 times daily   HYDROmorphone (DILAUDID) 4 mg, Oral, 3 times daily PRN, Three times a day as needed 20 days out of the month.   insulin lispro (HUMALOG) 100 UNIT/ML KwikPen Before each meal 3 times a day, 140-199 - 2 units, 200-250 - 6 units, 251-299 - 8 units,  300-349 - 12 units,  350 or above 14 units.   isosorbide mononitrate (IMDUR) 60 mg, Oral, Daily   midodrine (PROAMATINE) 5 mg, Oral, 3 times daily with meals   mirtazapine (REMERON) 15 mg, Oral, Daily at bedtime   pantoprazole (PROTONIX) 40 mg, Oral, 2 times daily   spironolactone (ALDACTONE) 12.5 mg,  Oral, Daily   torsemide (DEMADEX) 50 mg, Oral, 2 times daily    Diet Orders (From admission, onward)     Start     Ordered   12/21/22 1621  Diet NPO time specified  Diet effective now        12/21/22 1620            DVT prophylaxis: SCDs Start: 12/14/22 2037   Lab Results  Component Value Date   PLT 430 (H) 12/22/2022      Code Status: Full Code  Family Communication:  at bedside  Status is: Inpatient Remains inpatient appropriate because: osteomyelitis-- eventual SNF  Level of care: Telemetry Medical  Consultants:  Nephrology GI ortho  Objective: Vitals:   12/23/22 0909 12/23/22 0920 12/23/22 0930 12/23/22 1141  BP: (!) 112/53 100/74 106/75 106/77  Pulse: (!) 122 (!) 105 (!) 107 100  Resp: 18 (!) 21 (!) 28 15  Temp: 97.9 F (36.6 C)   (!) 97.5 F (36.4 C)  TempSrc: Temporal     SpO2: 92% 99% 99% 100%  Weight:      Height:  Intake/Output Summary (Last 24 hours) at 12/23/2022 1147 Last data filed at 12/23/2022 0855 Gross per 24 hour  Intake 100 ml  Output --  Net 100 ml   Wt Readings from Last 3 Encounters:  12/23/22 121.6 kg  11/28/22 127.5 kg  10/19/22 132.9 kg    Examination:   General: Appearance:    Severely obese female tearful about need for amputation     Lungs:      respirations unlabored  Heart:    Tachycardic.   MS:   All extremities are intact.   Neurologic:   Awake, alert     Data Reviewed: I have independently reviewed following labs and imaging studies   CBC Recent Labs  Lab 12/17/22 0848 12/18/22 1746 12/20/22 0259 12/21/22 0820 12/22/22 0701  WBC 22.4* 23.9* 21.7* 22.9* 25.7*  HGB 7.2* 7.8* 6.9* 7.8* 7.3*  HCT 23.9* 25.9* 23.8* 25.8* 24.1*  PLT 446* 439* 448* 492* 430*  MCV 93.7 95.6 95.6 94.5 93.1  MCH 28.2 28.8 27.7 28.6 28.2  MCHC 30.1 30.1 29.0* 30.2 30.3  RDW 17.8* 17.3* 17.1* 17.3* 17.2*  LYMPHSABS  --   --  1.8  --   --   MONOABS  --   --  0.9  --   --   EOSABS  --   --  0.4  --   --    BASOSABS  --   --  0.1  --   --     Recent Labs  Lab 12/17/22 0848 12/20/22 0259 12/21/22 0820 12/21/22 1342 12/22/22 0701  NA 136 137 135  --  135  K 4.2 3.5 3.4*  --  3.3*  CL 99 97* 97*  --  98  CO2 19* 23 26  --  26  GLUCOSE 94 61* 108*  --  100*  BUN 45* 34* 36*  --  20  CREATININE 5.98* 5.58* 5.61*  --  3.93*  CALCIUM 7.9* 8.1* 8.3*  --  8.2*  AST 8* 7*  --   --  8*  ALT 13  --   --   --  7  ALKPHOS 97 87  --   --  79  BILITOT 0.8  --   --   --  0.9  ALBUMIN 1.8* 1.8* 1.9*  --  1.8*  MG 1.8 1.5*  --   --  1.8  CRP  --   --   --  14.8*  --     ------------------------------------------------------------------------------------------------------------------ No results for input(s): "CHOL", "HDL", "LDLCALC", "TRIG", "CHOLHDL", "LDLDIRECT" in the last 72 hours.  Lab Results  Component Value Date   HGBA1C 8.1 (H) 06/19/2022   ------------------------------------------------------------------------------------------------------------------ No results for input(s): "TSH", "T4TOTAL", "T3FREE", "THYROIDAB" in the last 72 hours.  Invalid input(s): "FREET3"  Cardiac Enzymes No results for input(s): "CKMB", "TROPONINI", "MYOGLOBIN" in the last 168 hours.  Invalid input(s): "CK" ------------------------------------------------------------------------------------------------------------------    Component Value Date/Time   BNP 368.9 (H) 12/14/2022 1700    CBG: Recent Labs  Lab 12/22/22 1412 12/22/22 1819 12/22/22 2234 12/23/22 0753 12/23/22 1140  GLUCAP 96 85 81 76 82    No results found for this or any previous visit (from the past 240 hour(s)).   Radiology Studies: No results found.   Marlin Canary DO [facility]  Between 7 am - 7 pm I am available, please contact me via Amion (for emergencies) or Securechat (non urgent messages)  Between 7 pm - 7 am I am not available, please contact night  coverage MD/APP via Amion

## 2022-12-24 DIAGNOSIS — Q8589 Other phakomatoses, not elsewhere classified: Secondary | ICD-10-CM | POA: Insufficient documentation

## 2022-12-24 DIAGNOSIS — D649 Anemia, unspecified: Secondary | ICD-10-CM | POA: Diagnosis not present

## 2022-12-24 LAB — BPAM RBC
Blood Product Expiration Date: 202410212359
Blood Product Expiration Date: 202410302359
ISSUE DATE / TIME: 202410150049
Unit Type and Rh: 5100
Unit Type and Rh: 5100

## 2022-12-24 LAB — CBC
HCT: 25.4 % — ABNORMAL LOW (ref 36.0–46.0)
Hemoglobin: 7.6 g/dL — ABNORMAL LOW (ref 12.0–15.0)
MCH: 28.7 pg (ref 26.0–34.0)
MCHC: 29.9 g/dL — ABNORMAL LOW (ref 30.0–36.0)
MCV: 95.8 fL (ref 80.0–100.0)
Platelets: 345 10*3/uL (ref 150–400)
RBC: 2.65 MIL/uL — ABNORMAL LOW (ref 3.87–5.11)
RDW: 16.9 % — ABNORMAL HIGH (ref 11.5–15.5)
WBC: 22 10*3/uL — ABNORMAL HIGH (ref 4.0–10.5)
nRBC: 0 % (ref 0.0–0.2)

## 2022-12-24 LAB — BASIC METABOLIC PANEL
Anion gap: 14 (ref 5–15)
BUN: 18 mg/dL (ref 6–20)
CO2: 23 mmol/L (ref 22–32)
Calcium: 8.6 mg/dL — ABNORMAL LOW (ref 8.9–10.3)
Chloride: 97 mmol/L — ABNORMAL LOW (ref 98–111)
Creatinine, Ser: 4.19 mg/dL — ABNORMAL HIGH (ref 0.44–1.00)
GFR, Estimated: 12 mL/min — ABNORMAL LOW (ref >60)
Glucose, Bld: 107 mg/dL — ABNORMAL HIGH (ref 70–99)
Potassium: 3.2 mmol/L — ABNORMAL LOW (ref 3.5–5.1)
Sodium: 134 mmol/L — ABNORMAL LOW (ref 135–145)

## 2022-12-24 LAB — TYPE AND SCREEN
ABO/RH(D): O POS
Antibody Screen: NEGATIVE
Donor AG Type: NEGATIVE
Donor AG Type: NEGATIVE
Unit division: 0
Unit division: 0

## 2022-12-24 LAB — GLUCOSE, CAPILLARY
Glucose-Capillary: 138 mg/dL — ABNORMAL HIGH (ref 70–99)
Glucose-Capillary: 100 mg/dL — ABNORMAL HIGH (ref 70–99)
Glucose-Capillary: 136 mg/dL — ABNORMAL HIGH (ref 70–99)

## 2022-12-24 LAB — PHOSPHORUS: Phosphorus: 3.3 mg/dL (ref 2.5–4.6)

## 2022-12-24 MED ORDER — HEPARIN SODIUM (PORCINE) 1000 UNIT/ML IJ SOLN
INTRAMUSCULAR | Status: AC
  Administered 2022-12-24: 3800 [IU]
  Filled 2022-12-24: qty 4

## 2022-12-24 MED ORDER — SUCRALFATE 1 GM/10ML PO SUSP
1.0000 g | Freq: Three times a day (TID) | ORAL | Status: AC
  Administered 2022-12-24 – 2023-01-07 (×44): 1 g via ORAL
  Filled 2022-12-24 (×47): qty 10

## 2022-12-24 MED ORDER — HYDROMORPHONE HCL 1 MG/ML IJ SOLN
0.5000 mg | Freq: Once | INTRAMUSCULAR | Status: AC
  Administered 2022-12-24: 0.5 mg via INTRAVENOUS

## 2022-12-24 MED ORDER — HYDROMORPHONE HCL 1 MG/ML IJ SOLN
0.5000 mg | INTRAMUSCULAR | Status: AC | PRN
  Administered 2022-12-24 – 2022-12-25 (×4): 0.5 mg via INTRAVENOUS
  Filled 2022-12-24 (×4): qty 0.5

## 2022-12-24 MED ORDER — ANTICOAGULANT SODIUM CITRATE 4% (200MG/5ML) IV SOLN: 5.0000 mL | Status: DC | PRN

## 2022-12-24 MED ORDER — ALTEPLASE 2 MG IJ SOLR
2.0000 mg | Freq: Once | INTRAMUSCULAR | Status: AC | PRN
  Administered 2022-12-24: 3.8 mg
  Filled 2022-12-24: qty 2

## 2022-12-24 MED ORDER — HEPARIN SODIUM (PORCINE) 1000 UNIT/ML DIALYSIS
1000.0000 [IU] | INTRAMUSCULAR | Status: DC | PRN
  Filled 2022-12-24: qty 1

## 2022-12-24 NOTE — Procedures (Signed)
Video Capsule Endoscopy completed and images reviewed and notable for the following:  Findings: 1) complete study with adequate prep and good views of the small bowel 2) video capsule placed endoscopically directly into the small bowel with first duodenal image at 00:02:35.  First cecal image at 03: 1 2: 01.  Small bowel transit time approximately 3 hours 10 minutes 3) small fleck of blood in the proximal small bowel at 20 minutes.  No nearby mucosal pathology 4) several small, nonbleeding polyps in the distal small bowel, all within 13 minutes of the ileocecal valve 5) remainder of the small bowel normal-appearing.  No active bleeding or high-grade stigmata of bleeding 6) Limited views of the colon, but no blood noted   Summary: Overall, complete capsule study with good views of the small bowel.  No active bleeding or high-grade stigmata of recent bleeding noted.  Several small, nonbleeding polyps noted in the distal small bowel.  These may be within reach of colonoscopy with ileoscopy.  EGD performe immediately prior to video capsule endoscopy notable for at least 3 small gastric AVMs, 1 with adherent clot and active bleed.  He was also friability of the gastric mucosa with contact oozing.  Suspect this to be the source of any GI bleed induced anemia.  Recommendations: - Recommend colonoscopy with ileoscopy.  If hemodynamically stable, no active bleeding, can be pursued as an outpatient - Daily CBC checks with blood products as needed per protocol - Continue high-dose PPI -Inpatient GI service will sign off at this time.  Please do not hesitate to contact us with additional questions or concerns   Doristine Locks, DO, [facility]

## 2022-12-24 NOTE — Procedures (Signed)
HD Note:  Some information was entered later than the data was gathered due to patient care needs. The stated time with the data is accurate.  Received patient in bed to unit.   Alert and oriented. Patient crying in pain.  Informed consent signed and in chart.   Access used: upper right chest HD catheter Access issues: Yes  Patient arterial pressures were too high to allow treatment to continue.  Lines were reversed, flushes performed, repositioning was done multiple times and nothing helped.  CathFlow placed to dwell.  Patient continued to be in pain during treatment.  Medication given. See MAR. Patient also crying because she is going to have to have an amputation.  [place]  TX duration: 3.5  Patient mentation unchanged during treatment  Total UF removed: None, patient received 200 ml  Hand-off given to patient's nurse.   Transported back to the room   Anasia Agro L. Dareen Piano, RN [facility].

## 2022-12-24 NOTE — Progress Notes (Signed)
Unsuccessful HD session today due to clotting HD catheter. Nephrologist notified by HD nurse

## 2022-12-24 NOTE — Procedures (Signed)
Patient was seen on dialysis and the procedure was supervised.  BFR 400  Via TDC BP is  158/83.   Patient appears to be tolerating treatment well. C/o abd pain, receiving prn dilaudid. D/w RN.  Yardley Beltran Jaynie Collins 12/24/2022

## 2022-12-24 NOTE — Progress Notes (Signed)
PROGRESS NOTE  Deborah [name] FAO:[id number] DOB: 1965-04-11 DOA: 12/14/2022 PCP: Pcp, No   LOS: 10 days   Brief Narrative / Interim history: 57 y.o. female with hx of ESRD on M/W/F HD, PAF on Eliquis, HFpEF, HTN, DM-2, malignant carcinoid, NAFLD, morbid obesity, chronic ulceration R ankle, recent admission 9/16 -24 initially suspected sepsis from SSTI but ultimately sepsis ruled out, and antibiotics were stopped. That admission underwent angiogram which showed adequate perfusion of the R foot. Treated for gout involving R ankle. Returns due to initial complaint of abd pain, per ED note 1 day history persistent abd pain. Had also missed last HD session, last session on 10/4. Found to have anemia, with Hb of 5.8. On further history, per RN report patients mother provided collateral of 1 week of bright red blood per rectum.  Hospital stay complicated by repeat GI bleed,   Subjective / 24h Interval events: Continues to ask for IV dilaudid-- now saying she can not swallow pills   Assesement and Plan:  Acute on chronic anemia, concern for acute blood loss anemia due to lower GI bleed -gastroenterology consulted, appreciate input.  They saw her last week, and without overt bleeding recommended monitoring.  Hemoglobin has remained stable over the weekend, and upon resumption of her Eliquis hemoglobin dropped again to 6.9 -She was transfused a second unit of packed red blood cells on 10/14.Gastroenterology was consulted and she is s/p ONG:EXBMWU reflux esophagitis in the lower esophagus, normal-appearing duodenum. Had 3 small AVMs 1 with adherent clot that was washed off and revealed active bleeding   high-dose PPI to try to prophylax, but some degree of bleeding could be inevitable if she has to restart anticoagulation. Not a candidate for Carafate long-term given her ESRD. Can give for the next 1-2 weeks, then needs to stop.  -had capsule endoscopy: - Recommend colonoscopy with ileoscopy.  If  hemodynamically stable, no active bleeding, can be pursued as an outpatient - Daily CBC checks with blood products as needed per protocol - Continue high-dose PPI  ESRD -nephrology consulted, appreciate input.   - HD 10/18  A-fib-given new drop in hemoglobin with resumption of her Eliquis, hold Eliquis indefinitely for now (at least until after surgery)  Chronic right heel wound, concern for acute osteomyelitis-last month she underwent an MRI that did not show any osteomyelitis.  Given persistent leukocytosis I have repeated the MRI on 10/14 which does show now early osteomyelitis.  Dr. Lajoyce Corners consulted, appreciate input. -Nontoxic-appearing, hold antibiotics.  Needs at least a bone biopsy unless Dr. Lajoyce Corners offers an amputation and patient is agreeable- -he plans to speak with family again this weekend  Acute toxic encephalopathy, possibly due to opiate medication -monitor,  home Dilaudid dose.  MRI negative.    Leukocytosis-recently on steroids for gout.  She is afebrile, but MRI does show concerns for osteomyelitis to the right heel  Metastatic duodenal carcinoid-had resection in the past  Chronic diastolic CHF-euvolemic, volume management per HD  Chronic pain- home Dilaudid at a lower dose, IV PRN dilaudid for SEVERE pain  Hyperlipidemia, NAFLD-continue statin  Mood disorder-continue duloxetine, added diazepam  Gout-continue home allopurinol  Chronic wounds-wound care consult.  Right foot MRI done just 2 weeks ago without evidence of osteomyelitis  Chronic back pain, spinal stenosis, lumbar region with neurogenic claudication -now wheelchair-bound for several months, underwent an MRI of the lumbar spine earlier this year. It showed lower lumbar spondylosis worsening since 2019, worst at L4-5, with facet arthropathy with joint effusion and  periarticular edema and unchanged severe right neuroforaminal narrowing at L5-S1.  Neurosurgery evaluated patient March 2024 and did not recommend  surgical approach at this point but continued PT, outpatient follow-up -PT recommends SNF, family in agreement  Scheduled Meds:  (feeding supplement) PROSource Plus  30 mL Oral BID BM   allopurinol  100 mg Oral BID   atorvastatin  10 mg Oral QHS   busPIRone  10 mg Oral BID   darbepoetin (ARANESP) injection - DIALYSIS  200 mcg Subcutaneous Q Fri-1800   DULoxetine  40 mg Oral Daily   ferric citrate  420 mg Oral TID WC   folic acid  1 mg Oral Daily   Gerhardt's butt cream   Topical BID   insulin aspart  0-6 Units Subcutaneous TID WC   insulin aspart  2 Units Subcutaneous TID WC   insulin glargine-yfgn  8 Units Subcutaneous QHS   leptospermum manuka honey  1 Application Topical Daily   midodrine  5 mg Oral TID WC   mirtazapine  7.5 mg Oral QHS   pantoprazole  40 mg Oral BID AC   sucralfate  1 g Oral TID WC & HS   Continuous Infusions:     PRN Meds:.acetaminophen, albuterol, haloperidol lactate, HYDROmorphone (DILAUDID) injection, HYDROmorphone, ondansetron (ZOFRAN) IV  Current Outpatient Medications  Medication Instructions   albuterol (PROVENTIL) 2.5 mg, Nebulization, Every 6 hours PRN   allopurinol (ZYLOPRIM) 100 mg, Oral, 2 times daily   apixaban (ELIQUIS) 5 mg, Oral, 2 times daily   atorvastatin (LIPITOR) 10 mg, Oral, Daily   busPIRone (BUSPAR) 5 mg, Oral, 2 times daily   carvedilol (COREG) 12.5 mg, Oral, 2 times daily   cetirizine (ZYRTEC) 10 mg, Oral, Daily   colchicine 0.3 mg, Oral, 2 times weekly   doxercalciferol (HECTOROL) 4 mcg, Intravenous, Every M-W-F (Hemodialysis)   EASY COMFORT PEN NEEDLES 31G X 5 MM MISC USE 3 TIMES A DAY FOR INSULIN ADMINISTRATION   famotidine (PEPCID) 20 mg, Oral, Daily   ferric citrate (AURYXIA) 420 mg, Oral, 3 times daily with meals   fluticasone (FLONASE) 50 MCG/ACT nasal spray 2 sprays, Each Nare, Daily PRN   gabapentin (NEURONTIN) 100 mg, Oral, 3 times daily   hydrALAZINE (APRESOLINE) 50 mg, Oral, 3 times daily   HYDROmorphone  (DILAUDID) 4 mg, Oral, 3 times daily PRN, Three times a day as needed 20 days out of the month.   insulin lispro (HUMALOG) 100 UNIT/ML KwikPen Before each meal 3 times a day, 140-199 - 2 units, 200-250 - 6 units, 251-299 - 8 units,  300-349 - 12 units,  350 or above 14 units.   isosorbide mononitrate (IMDUR) 60 mg, Oral, Daily   midodrine (PROAMATINE) 5 mg, Oral, 3 times daily with meals   mirtazapine (REMERON) 15 mg, Oral, Daily at bedtime   pantoprazole (PROTONIX) 40 mg, Oral, 2 times daily   spironolactone (ALDACTONE) 12.5 mg, Oral, Daily   torsemide (DEMADEX) 50 mg, Oral, 2 times daily    Diet Orders (From admission, onward)     Start     Ordered   12/24/22 0911  Diet heart healthy/carb modified Room service appropriate? Yes; Fluid consistency: Thin; Fluid restriction: 1200 mL Fluid  Diet effective now       Question Answer Comment  Diet-HS Snack? Nothing   Room service appropriate? Yes   Fluid consistency: Thin   Fluid restriction: 1200 mL Fluid      12/24/22 0910  DVT prophylaxis: SCDs Start: 12/14/22 2037   Lab Results  Component Value Date   PLT 345 12/24/2022      Code Status: Full Code    Status is: Inpatient Remains inpatient appropriate because: osteomyelitis-- eventual SNF  Level of care: Telemetry Medical  Consultants:  Nephrology GI ortho  Objective: Vitals:   12/24/22 0900 12/24/22 0932 12/24/22 1035 12/24/22 1112  BP: (!) 158/83 133/80 (!) 141/77 (!) 155/78  Pulse: (!) 104 (!) 113 (!) 108 (!) 106  Resp: 20 15  18   Temp:    97.8 F (36.6 C)  TempSrc:      SpO2: 100% 100% 98% 100%  Weight:    125.1 kg  Height:        Intake/Output Summary (Last 24 hours) at 12/24/2022 1332 Last data filed at 12/24/2022 1112 Gross per 24 hour  Intake --  Output 1 ml  Net -1 ml   Wt Readings from Last 3 Encounters:  12/24/22 125.1 kg  11/28/22 127.5 kg  10/19/22 132.9 kg    Examination:    General: Appearance:    Obese female in  no acute distress     Lungs:     respirations unlabored  Heart:    Tachycardic.   MS:   All extremities are intact.   Neurologic:   Awake, alert, poor insight     Data Reviewed: I have independently reviewed following labs and imaging studies   CBC Recent Labs  Lab 12/18/22 1746 12/20/22 0259 12/21/22 0820 12/22/22 0701 12/24/22 0712  WBC 23.9* 21.7* 22.9* 25.7* 22.0*  HGB 7.8* 6.9* 7.8* 7.3* 7.6*  HCT 25.9* 23.8* 25.8* 24.1* 25.4*  PLT 439* 448* 492* 430* 345  MCV 95.6 95.6 94.5 93.1 95.8  MCH 28.8 27.7 28.6 28.2 28.7  MCHC 30.1 29.0* 30.2 30.3 29.9*  RDW 17.3* 17.1* 17.3* 17.2* 16.9*  LYMPHSABS  --  1.8  --   --   --   MONOABS  --  0.9  --   --   --   EOSABS  --  0.4  --   --   --   BASOSABS  --  0.1  --   --   --     Recent Labs  Lab 12/20/22 0259 12/21/22 0820 12/21/22 1342 12/22/22 0701 12/24/22 0712  NA 137 135  --  135 134*  K 3.5 3.4*  --  3.3* 3.2*  CL 97* 97*  --  98 97*  CO2 23 26  --  26 23  GLUCOSE 61* 108*  --  100* 107*  BUN 34* 36*  --  20 18  CREATININE 5.58* 5.61*  --  3.93* 4.19*  CALCIUM 8.1* 8.3*  --  8.2* 8.6*  AST 7*  --   --  8*  --   ALT  --   --   --  7  --   ALKPHOS 87  --   --  79  --   BILITOT  --   --   --  0.9  --   ALBUMIN 1.8* 1.9*  --  1.8*  --   MG 1.5*  --   --  1.8  --   CRP  --   --  14.8*  --   --     ------------------------------------------------------------------------------------------------------------------ No results for input(s): "CHOL", "HDL", "LDLCALC", "TRIG", "CHOLHDL", "LDLDIRECT" in the last 72 hours.  Lab Results  Component Value Date   HGBA1C 8.1 (H) 06/19/2022   ------------------------------------------------------------------------------------------------------------------ No results  for input(s): "TSH", "T4TOTAL", "T3FREE", "THYROIDAB" in the last 72 hours.  Invalid input(s): "FREET3"  Cardiac Enzymes No results for input(s): "CKMB", "TROPONINI", "MYOGLOBIN" in the last 168  hours.  Invalid input(s): "CK" ------------------------------------------------------------------------------------------------------------------    Component Value Date/Time   BNP 368.9 (H) 12/14/2022 1700    CBG: Recent Labs  Lab 12/22/22 2234 12/23/22 0753 12/23/22 1140 12/23/22 1740 12/23/22 2220  GLUCAP 81 76 82 250* 215*    No results found for this or any previous visit (from the past 240 hour(s)).   Radiology Studies: No results found.   Marlin Canary DO [facility]  Between 7 am - 7 pm I am available, please contact me via Amion (for emergencies) or Securechat (non urgent messages)  Between 7 pm - 7 am I am not available, please contact night coverage MD/APP via Amion

## 2022-12-24 NOTE — Progress Notes (Signed)
PT Cancellation Note  Patient Details Name: Deborah [name] MRN: [id number] DOB: Jan 20, 1966   Cancelled Treatment:    Reason Eval/Treat Not Completed: Patient at procedure or test/unavailable, pt at HD this morning. Will circle back as time/schedule allows this afternoon.   Vickki Muff, PT, DPT   Acute Rehabilitation Department Office [phone] Secure Chat Communication Preferred   Ronnie Derby 12/24/2022, 10:33 AM

## 2022-12-24 NOTE — Plan of Care (Signed)
  Problem: Education: Goal: Understanding of CV disease, CV risk reduction, and recovery process will improve Outcome: Progressing Goal: Individualized Educational Video(s) Outcome: Progressing   Problem: Activity: Goal: Ability to return to baseline activity level will improve Outcome: Progressing   Problem: Cardiovascular: Goal: Ability to achieve and maintain adequate cardiovascular perfusion will improve Outcome: Progressing Goal: Vascular access site(s) Level 0-1 will be maintained Outcome: Progressing   Problem: Health Behavior/Discharge Planning: Goal: Ability to safely manage health-related needs after discharge will improve Outcome: Progressing   Problem: Education: Goal: Ability to describe self-care measures that may prevent or decrease complications (Diabetes Survival Skills Education) will improve Outcome: Progressing Goal: Individualized Educational Video(s) Outcome: Progressing   Problem: Coping: Goal: Ability to adjust to condition or change in health will improve Outcome: Progressing   Problem: Fluid Volume: Goal: Ability to maintain a balanced intake and output will improve Outcome: Progressing   Problem: Health Behavior/Discharge Planning: Goal: Ability to identify and utilize available resources and services will improve Outcome: Progressing Goal: Ability to manage health-related needs will improve Outcome: Progressing   Problem: Metabolic: Goal: Ability to maintain appropriate glucose levels will improve Outcome: Progressing   Problem: Nutritional: Goal: Progress toward achieving an optimal weight will improve Outcome: Progressing   Problem: Skin Integrity: Goal: Risk for impaired skin integrity will decrease Outcome: Progressing   Problem: Tissue Perfusion: Goal: Adequacy of tissue perfusion will improve Outcome: Progressing   Problem: Education: Goal: Knowledge of General Education information will improve Description: Including pain  rating scale, medication(s)/side effects and non-pharmacologic comfort measures Outcome: Progressing   Problem: Health Behavior/Discharge Planning: Goal: Ability to manage health-related needs will improve Outcome: Progressing   Problem: Clinical Measurements: Goal: Ability to maintain clinical measurements within normal limits will improve Outcome: Progressing Goal: Will remain free from infection Outcome: Progressing Goal: Diagnostic test results will improve Outcome: Progressing Goal: Respiratory complications will improve Outcome: Progressing Goal: Cardiovascular complication will be avoided Outcome: Progressing   Problem: Activity: Goal: Risk for activity intolerance will decrease Outcome: Progressing   Problem: Nutrition: Goal: Adequate nutrition will be maintained Outcome: Progressing   Problem: Coping: Goal: Level of anxiety will decrease Outcome: Progressing   Problem: Elimination: Goal: Will not experience complications related to bowel motility Outcome: Progressing   Problem: Pain Managment: Goal: General experience of comfort will improve Outcome: Progressing   Problem: Safety: Goal: Ability to remain free from injury will improve Outcome: Progressing   Problem: Skin Integrity: Goal: Risk for impaired skin integrity will decrease Outcome: Progressing   Problem: Education: Goal: Understanding of post-operative needs will improve Outcome: Progressing Goal: Individualized Educational Video(s) Outcome: Progressing   Problem: Clinical Measurements: Goal: Postoperative complications will be avoided or minimized Outcome: Progressing   Problem: Respiratory: Goal: Will regain and/or maintain adequate ventilation Outcome: Progressing

## 2022-12-24 NOTE — TOC Progression Note (Signed)
Transition of Care [facility]) - Progression Note    Patient Details  Name: Deborah [name] MRN: [id number] Date of Birth: 08-Dec-1965  Transition of Care [facility]) CM/SW Contact  Carley Hammed, LCSW Phone Number: 12/24/2022, 12:53 PM  Clinical Narrative:    TOC continues to follow for disposition and DC needs. Noting possible surgery next week, will complete assessment and workup closer to being medically ready. Please consult for any further needs.         Expected Discharge Plan and Services                                               Social Determinants of Health (SDOH) Interventions SDOH Screenings   Food Insecurity: No Food Insecurity (12/15/2022)  Housing: Patient Declined (12/15/2022)  Transportation Needs: No Transportation Needs (12/15/2022)  Utilities: Not At Risk (12/15/2022)  Depression (PHQ2-9): High Risk (11/18/2022)  Financial Resource Strain: Low Risk (03/23/2021)   Received from [facility])  Physical Activity: Not on File (10/31/2017)   Received from [place], [place]  Social Connections: Unknown (07/19/2021)   Received from [facility]  Stress: Low Risk (03/23/2021)   Received from [facility])  Tobacco Use: Low Risk  (12/23/2022)    Readmission Risk Interventions    11/25/2022    5:16 PM 07/05/2022    1:20 PM  Readmission Risk Prevention Plan  Transportation Screening Complete Complete  Medication Review (RN Care Manager) Complete Referral to Pharmacy  PCP or Specialist appointment within 3-5 days of discharge Complete Complete  HRI or Home Care Consult Complete Complete  SW Recovery Care/Counseling Consult Complete Complete  Palliative Care Screening Not Applicable Not Applicable  Skilled Nursing Facility Not Applicable Not Applicable

## 2022-12-24 NOTE — Progress Notes (Signed)
[facility] Progress Note   Subjective:   Patient seen and examined at bedside in dialysis.  Tearful, reports 8/10 pain in abdomen with nausea.  States pain starts every time she comes to HD, as soon as she "rolls through the doors."  Only thing that makes it better is medication.  Denies CP, SOB, vomiting and diarrhea.   Objective Vitals:   12/24/22 0535 12/24/22 0801 12/24/22 0827 12/24/22 0844  BP: 94/77 (!) 103/53 (!) 158/79 (!) 159/96  Pulse: 98 91 (!) 102 (!) 104  Resp: 18 16 18  (!) 27  Temp: 98 F (36.7 C) 98.6 F (37 C) 98 F (36.7 C)   TempSrc: Oral Oral    SpO2: 99% 100% 100% 100%  Weight: 127.3 kg  124.9 kg   Height:       Physical Exam General:chronically ill appearing, tearful woman in NAD Heart:RRR, no mrg Lungs:CTAB anteriorly Abdomen:soft, +tenderness, ND Extremities:trace LE edema b/l Dialysis Access: TDC in use   Filed Weights   12/23/22 0700 12/24/22 0535 12/24/22 0827  Weight: 121.6 kg 127.3 kg 124.9 kg    Intake/Output Summary (Last 24 hours) at 12/24/2022 0847 Last data filed at 12/23/2022 1700 Gross per 24 hour  Intake 220 ml  Output 1 ml  Net 219 ml    Additional Objective Labs: Basic Metabolic Panel: Recent Labs  Lab 12/20/22 0259 12/21/22 0820 12/22/22 0701 12/24/22 0712  NA 137 135 135 134*  K 3.5 3.4* 3.3* 3.2*  CL 97* 97* 98 97*  CO2 23 26 26 23   GLUCOSE 61* 108* 100* 107*  BUN 34* 36* 20 18  CREATININE 5.58* 5.61* 3.93* 4.19*  CALCIUM 8.1* 8.3* 8.2* 8.6*  PHOS 5.7* 5.0*  --  3.3   Liver Function Tests: Recent Labs  Lab 12/17/22 0848 12/20/22 0259 12/21/22 0820 12/22/22 0701  AST 8* 7*  --  8*  ALT 13  --   --  7  ALKPHOS 97 87  --  79  BILITOT 0.8  --   --  0.9  PROT 6.0* 6.0*  --  5.9*  ALBUMIN 1.8* 1.8* 1.9* 1.8*   CBC: Recent Labs  Lab 12/18/22 1746 12/20/22 0259 12/21/22 0820 12/22/22 0701 12/24/22 0712  WBC 23.9* 21.7* 22.9* 25.7* 22.0*  NEUTROABS  --  18.3*  --   --   --   HGB 7.8*  6.9* 7.8* 7.3* 7.6*  HCT 25.9* 23.8* 25.8* 24.1* 25.4*  MCV 95.6 95.6 94.5 93.1 95.8  PLT 439* 448* 492* 430* 345    Medications:  anticoagulant sodium citrate      (feeding supplement) PROSource Plus  30 mL Oral BID BM   allopurinol  100 mg Oral BID   atorvastatin  10 mg Oral QHS   busPIRone  10 mg Oral BID   darbepoetin (ARANESP) injection - DIALYSIS  200 mcg Subcutaneous Q Fri-1800   DULoxetine  40 mg Oral Daily   ferric citrate  420 mg Oral TID WC   folic acid  1 mg Oral Daily   Gerhardt's butt cream   Topical BID   insulin aspart  0-6 Units Subcutaneous TID WC   insulin aspart  2 Units Subcutaneous TID WC   insulin glargine-yfgn  8 Units Subcutaneous QHS   leptospermum manuka honey  1 Application Topical Daily   midodrine  5 mg Oral TID WC   mirtazapine  7.5 mg Oral QHS   pantoprazole  40 mg Oral BID AC   sucralfate  1 g  Oral TID WC & HS    Dialysis Orders: [facility]   F180   400/800   3K/2.5 bath  126.9kg   RDC   - mircera 225 mcg last 10/5 - hectorol 7 mcg   Assessment/Plan: ESRD: Back to usual MWF schedule - HD today per regular schedule.  Chronic Wounds - on R heel and LE. Wound care following.  AMS (improving): MRI head completed with no acute changes. Per PMD BP/volume: BP stable, minimal edema on exam. Continue home meds, UF as tolerated. Below prior EDW - will need to lower on discharge. Acute on Chronic Anemia of ESRD: Hgb ^7.6. S/p EGD and capsule endo 10/17. Multiple gastric AVMs, treated with APC. Eliquis on hold. 2U PRBCs given this admit. Due for ESA today - ordered. Abdominal pain: CT scan without acute concern. GI resumed Reglan. Due to #5? Secondary Hyperparathyroidism/Hyperphosphatemia: CorrCa/Phos good, continue meds. Vascular access - TDC in place Nutrition: Alb very low, start supplements.  K low, will change diet to carb modified.   Virgina Norfolk, PA-C [facility] 12/24/2022,8:47 AM  LOS: 10 days

## 2022-12-25 DIAGNOSIS — D649 Anemia, unspecified: Secondary | ICD-10-CM | POA: Diagnosis not present

## 2022-12-25 LAB — CBC
HCT: 23.7 % — ABNORMAL LOW (ref 36.0–46.0)
Hemoglobin: 7 g/dL — ABNORMAL LOW (ref 12.0–15.0)
MCH: 28.3 pg (ref 26.0–34.0)
MCHC: 29.5 g/dL — ABNORMAL LOW (ref 30.0–36.0)
MCV: 96 fL (ref 80.0–100.0)
Platelets: 243 10*3/uL (ref 150–400)
RBC: 2.47 MIL/uL — ABNORMAL LOW (ref 3.87–5.11)
RDW: 17 % — ABNORMAL HIGH (ref 11.5–15.5)
WBC: 20.2 10*3/uL — ABNORMAL HIGH (ref 4.0–10.5)
nRBC: 0.1 % (ref 0.0–0.2)

## 2022-12-25 LAB — BASIC METABOLIC PANEL
Anion gap: 13 (ref 5–15)
BUN: 18 mg/dL (ref 6–20)
CO2: 21 mmol/L — ABNORMAL LOW (ref 22–32)
Calcium: 8 mg/dL — ABNORMAL LOW (ref 8.9–10.3)
Chloride: 101 mmol/L (ref 98–111)
Creatinine, Ser: 4.09 mg/dL — ABNORMAL HIGH (ref 0.44–1.00)
GFR, Estimated: 12 mL/min — ABNORMAL LOW (ref >60)
Glucose, Bld: 88 mg/dL (ref 70–99)
Potassium: 3.6 mmol/L (ref 3.5–5.1)
Sodium: 135 mmol/L (ref 135–145)

## 2022-12-25 LAB — GLUCOSE, CAPILLARY
Glucose-Capillary: 162 mg/dL — ABNORMAL HIGH (ref 70–99)
Glucose-Capillary: 247 mg/dL — ABNORMAL HIGH (ref 70–99)
Glucose-Capillary: 179 mg/dL — ABNORMAL HIGH (ref 70–99)

## 2022-12-25 MED ORDER — HYDROMORPHONE HCL 1 MG/ML IJ SOLN
0.5000 mg | INTRAMUSCULAR | Status: AC | PRN
  Administered 2022-12-25 – 2022-12-29 (×21): 0.5 mg via INTRAVENOUS
  Filled 2022-12-25 (×5): qty 0.5
  Filled 2022-12-25: qty 1
  Filled 2022-12-25 (×11): qty 0.5
  Filled 2022-12-25: qty 1
  Filled 2022-12-25 (×4): qty 0.5

## 2022-12-25 NOTE — Progress Notes (Signed)
[facility] Progress Note   Subjective:   Patient seen and examined at bedside.  Tired this morning.  No current abdominal pain or nausea.  Denies CP and SOB.  HD ended early d/t poorly functioning catheter.   Objective Vitals:   12/24/22 2100 12/25/22 0233 12/25/22 0500 12/25/22 0700  BP: 135/68 (!) 150/135  (!) 126/53  Pulse: 96 (!) 107  100  Resp: 20 20  20   Temp: 98 F (36.7 C) 99.1 F (37.3 C)  99 F (37.2 C)  TempSrc: Oral Oral  Oral  SpO2: 99% 99%  99%  Weight:   125.2 kg   Height:       Physical Exam General:chronically ill appearing female in NAD Heart:RRR, no mrg Lungs:CTAB, nml WOB on RA Abdomen:soft, NTND Extremities:trace LE edema Dialysis Access: [facility]   Filed Weights   12/24/22 0827 12/24/22 1112 12/25/22 0500  Weight: 124.9 kg 125.1 kg 125.2 kg    Intake/Output Summary (Last 24 hours) at 12/25/2022 0839 Last data filed at 12/24/2022 2130 Gross per 24 hour  Intake 120 ml  Output 0 ml  Net 120 ml    Additional Objective Labs: Basic Metabolic Panel: Recent Labs  Lab 12/20/22 0259 12/21/22 0820 12/22/22 0701 12/24/22 0712 12/25/22 0227  NA 137 135 135 134* 135  K 3.5 3.4* 3.3* 3.2* 3.6  CL 97* 97* 98 97* 101  CO2 23 26 26 23  21*  GLUCOSE 61* 108* 100* 107* 88  BUN 34* 36* 20 18 18   CREATININE 5.58* 5.61* 3.93* 4.19* 4.09*  CALCIUM 8.1* 8.3* 8.2* 8.6* 8.0*  PHOS 5.7* 5.0*  --  3.3  --    Liver Function Tests: Recent Labs  Lab 12/20/22 0259 12/21/22 0820 12/22/22 0701  AST 7*  --  8*  ALT  --   --  7  ALKPHOS 87  --  79  BILITOT  --   --  0.9  PROT 6.0*  --  5.9*  ALBUMIN 1.8* 1.9* 1.8*   CBC: Recent Labs  Lab 12/20/22 0259 12/21/22 0820 12/22/22 0701 12/24/22 0712 12/25/22 0227  WBC 21.7* 22.9* 25.7* 22.0* 20.2*  NEUTROABS 18.3*  --   --   --   --   HGB 6.9* 7.8* 7.3* 7.6* 7.0*  HCT 23.8* 25.8* 24.1* 25.4* 23.7*  MCV 95.6 94.5 93.1 95.8 96.0  PLT 448* 492* 430* 345 243   Medications:   (feeding  supplement) PROSource Plus  30 mL Oral BID BM   allopurinol  100 mg Oral BID   atorvastatin  10 mg Oral QHS   busPIRone  10 mg Oral BID   darbepoetin (ARANESP) injection - DIALYSIS  200 mcg Subcutaneous Q Fri-1800   DULoxetine  40 mg Oral Daily   ferric citrate  420 mg Oral TID WC   folic acid  1 mg Oral Daily   Gerhardt's butt cream   Topical BID   insulin aspart  0-6 Units Subcutaneous TID WC   insulin aspart  2 Units Subcutaneous TID WC   insulin glargine-yfgn  8 Units Subcutaneous QHS   leptospermum manuka honey  1 Application Topical Daily   midodrine  5 mg Oral TID WC   mirtazapine  7.5 mg Oral QHS   pantoprazole  40 mg Oral BID AC   sucralfate  1 g Oral TID WC & HS    Dialysis Orders: [facility]   F180   400/800   3K/2.5 bath  126.9kg   [facility]   -  mircera 225 mcg last 10/5 - hectorol 7 mcg   Assessment/Plan: ESRD: Back to usual MWF schedule - Ended early yesterday d/t poorly functioning TDC.  Cath flow dwell. No acute indications for extra HD today.  Next HD 10/21.  Chronic Wounds - on R heel and LE. Wound care following. Likely needs amputation but has been refusing.  AMS (improving): MRI head completed with no acute changes. Per PMD BP/volume: BP stable, minimal edema on exam. Continue home meds, UF as tolerated. Below prior EDW - will need to lower on discharge. Acute on Chronic Anemia of ESRD: Hgb ^7.6. S/p EGD Multiple gastric AVMs, treated with APC and capsule endo 10/17 w/ no acute bleeding but polyps.  Rec colonoscopy. Eliquis on hold. 2U PRBCs given this admit. Aranesp given yesterday, cont qwk. Abdominal pain: CT scan without acute concern. GI resumed Reglan. Due to #5? Secondary Hyperparathyroidism/Hyperphosphatemia: CorrCa/Phos good, continue meds. Vascular access - TDC in place. Not working well yesterday requiring cath flow dwell.  Nutrition: Alb very low, start supplements.  K low, will change diet to carb modified  Virgina Norfolk, PA-C [facility] 12/25/2022,8:39 AM  LOS: 11 days

## 2022-12-25 NOTE — Plan of Care (Signed)
  Problem: Coping: Goal: Ability to adjust to condition or change in health will improve Outcome: Not Progressing   Problem: Cardiovascular: Goal: Ability to achieve and maintain adequate cardiovascular perfusion will improve Outcome: Progressing   Problem: Metabolic: Goal: Ability to maintain appropriate glucose levels will improve Outcome: Progressing   Problem: Skin Integrity: Goal: Risk for impaired skin integrity will decrease Outcome: Progressing

## 2022-12-25 NOTE — Progress Notes (Signed)
PROGRESS NOTE  Deborah [name] EXB:[id number] DOB: [birth year] DOA: 12/14/2022 PCP: Pcp, No   LOS: 11 days   Brief Narrative / Interim history: 57 y.o. female with hx of ESRD on M/W/F HD, PAF on Eliquis, HFpEF, HTN, DM-2, malignant carcinoid, NAFLD, morbid obesity, chronic ulceration R ankle, recent admission 9/16 -24 initially suspected sepsis from SSTI but ultimately sepsis ruled out, and antibiotics were stopped. That admission underwent angiogram which showed adequate perfusion of the R foot. Treated for gout involving R ankle. Returns due to initial complaint of abd pain, per ED note 1 day history persistent abd pain. Had also missed last HD session, last session on 10/4. Found to have anemia, with Hb of 5.8. On further history, per RN report patients mother provided collateral of 1 week of bright red blood per rectum.  Hospital stay complicated by repeat GI bleed,   Subjective / 24h Interval events: Continues to ask for IV dilaudid-- says she is not eating   Assesement and Plan:  Acute on chronic anemia, concern for acute blood loss anemia due to lower GI bleed -gastroenterology consulted, appreciate input.  They saw her last week, and without overt bleeding recommended monitoring.  Hemoglobin has remained stable over the weekend, and upon resumption of her Eliquis hemoglobin dropped again to 6.9 -She was transfused a second unit of packed red blood cells on 10/14.Gastroenterology was consulted and she is s/p [id number] reflux esophagitis in the lower esophagus, normal-appearing duodenum. Had 3 small AVMs 1 with adherent clot that was washed off and revealed active bleeding   high-dose PPI to try to prophylax, but some degree of bleeding could be inevitable if she has to restart anticoagulation. Not a candidate for Carafate long-term given her ESRD. Can give for the next 1-2 weeks, then needs to stop.  -had capsule endoscopy: - Recommend colonoscopy with ileoscopy.  If hemodynamically  stable, no active bleeding, can be pursued as an outpatient - Daily CBC checks with blood products as needed per protocol - Continue high-dose PPI -hgb trending down but suspect related to not getting a full HD  ESRD -nephrology consulted, appreciate input.   - HD 10/18- partial  A-fib-given new drop in hemoglobin with resumption of her Eliquis, hold Eliquis indefinitely for now (at least until after surgery)  Chronic right heel wound, concern for acute osteomyelitis-last month she underwent an MRI that did not show any osteomyelitis.  Given persistent leukocytosis I have repeated the MRI on 10/14 which does show now early osteomyelitis.  Dr. Lajoyce Corners consulted, appreciate input. -Nontoxic-appearing, hold antibiotics.  Needs at least a bone biopsy unless Dr. Lajoyce Corners offers an amputation and patient is agreeable- -he plans to speak with family again this weekend  Acute toxic encephalopathy, possibly due to opiate medication -monitor,  home Dilaudid dose.  MRI negative.    Leukocytosis-recently on steroids for gout.  She is afebrile, but MRI does show concerns for osteomyelitis to the right heel  Metastatic duodenal carcinoid-had resection in the past  Chronic diastolic CHF-euvolemic, volume management per HD  Chronic pain- home Dilaudid, IV PRN dilaudid for SEVERE pain  Hyperlipidemia, NAFLD-continue statin  Mood disorder-continue duloxetine, added diazepam  Gout-continue home allopurinol  Chronic wounds-wound care consult.  Right foot MRI done just 2 weeks ago without evidence of osteomyelitis  Chronic back pain, spinal stenosis, lumbar region with neurogenic claudication -now wheelchair-bound for several months, underwent an MRI of the lumbar spine earlier this year. It showed lower lumbar spondylosis worsening since 2019, worst at L4-5,  with facet arthropathy with joint effusion and periarticular edema and unchanged severe right neuroforaminal narrowing at L5-S1.  Neurosurgery evaluated  patient March 2024 and did not recommend surgical approach at this point but continued PT, outpatient follow-up -PT recommends SNF, family in agreement  Scheduled Meds:  (feeding supplement) PROSource Plus  30 mL Oral BID BM   allopurinol  100 mg Oral BID   atorvastatin  10 mg Oral QHS   busPIRone  10 mg Oral BID   darbepoetin (ARANESP) injection - DIALYSIS  200 mcg Subcutaneous Q Fri-1800   DULoxetine  40 mg Oral Daily   ferric citrate  420 mg Oral TID WC   folic acid  1 mg Oral Daily   Gerhardt's butt cream   Topical BID   insulin aspart  0-6 Units Subcutaneous TID WC   insulin aspart  2 Units Subcutaneous TID WC   insulin glargine-yfgn  8 Units Subcutaneous QHS   leptospermum manuka honey  1 Application Topical Daily   midodrine  5 mg Oral TID WC   mirtazapine  7.5 mg Oral QHS   pantoprazole  40 mg Oral BID AC   sucralfate  1 g Oral TID WC & HS   Continuous Infusions:     PRN Meds:.acetaminophen, albuterol, haloperidol lactate, HYDROmorphone (DILAUDID) injection, HYDROmorphone, ondansetron (ZOFRAN) IV  Current Outpatient Medications  Medication Instructions   albuterol (PROVENTIL) 2.5 mg, Nebulization, Every 6 hours PRN   allopurinol (ZYLOPRIM) 100 mg, Oral, 2 times daily   apixaban (ELIQUIS) 5 mg, Oral, 2 times daily   atorvastatin (LIPITOR) 10 mg, Oral, Daily   busPIRone (BUSPAR) 5 mg, Oral, 2 times daily   carvedilol (COREG) 12.5 mg, Oral, 2 times daily   cetirizine (ZYRTEC) 10 mg, Oral, Daily   colchicine 0.3 mg, Oral, 2 times weekly   doxercalciferol (HECTOROL) 4 mcg, Intravenous, Every M-W-F (Hemodialysis)   EASY COMFORT PEN NEEDLES 31G X 5 MM MISC USE 3 TIMES A DAY FOR INSULIN ADMINISTRATION   famotidine (PEPCID) 20 mg, Oral, Daily   ferric citrate (AURYXIA) 420 mg, Oral, 3 times daily with meals   fluticasone (FLONASE) 50 MCG/ACT nasal spray 2 sprays, Each Nare, Daily PRN   gabapentin (NEURONTIN) 100 mg, Oral, 3 times daily   hydrALAZINE (APRESOLINE) 50 mg,  Oral, 3 times daily   HYDROmorphone (DILAUDID) 4 mg, Oral, 3 times daily PRN, Three times a day as needed 20 days out of the month.   insulin lispro (HUMALOG) 100 UNIT/ML KwikPen Before each meal 3 times a day, 140-199 - 2 units, 200-250 - 6 units, 251-299 - 8 units,  300-349 - 12 units,  350 or above 14 units.   isosorbide mononitrate (IMDUR) 60 mg, Oral, Daily   midodrine (PROAMATINE) 5 mg, Oral, 3 times daily with meals   mirtazapine (REMERON) 15 mg, Oral, Daily at bedtime   pantoprazole (PROTONIX) 40 mg, Oral, 2 times daily   spironolactone (ALDACTONE) 12.5 mg, Oral, Daily   torsemide (DEMADEX) 50 mg, Oral, 2 times daily    Diet Orders (From admission, onward)     Start     Ordered   12/25/22 1136  Diet Carb Modified Fluid consistency: Thin; Room service appropriate? Yes  Diet effective now       Question Answer Comment  Diet-HS Snack? Nothing   Calorie Level Medium 1600-2000   Fluid consistency: Thin   Room service appropriate? Yes      12/25/22 1135  DVT prophylaxis: SCDs Start: 12/14/22 2037   Lab Results  Component Value Date   PLT 243 12/25/2022      Code Status: Full Code    Status is: Inpatient Remains inpatient appropriate because: osteomyelitis-- eventual SNF  Level of care: Telemetry Medical  Consultants:  Nephrology GI ortho  Objective: Vitals:   12/24/22 2100 12/25/22 0233 12/25/22 0500 12/25/22 0700  BP: 135/68 (!) 150/135  (!) 126/53  Pulse: 96 (!) 107  100  Resp: 20 20  20   Temp: 98 F (36.7 C) 99.1 F (37.3 C)  99 F (37.2 C)  TempSrc: Oral Oral  Oral  SpO2: 99% 99%  99%  Weight:   125.2 kg   Height:        Intake/Output Summary (Last 24 hours) at 12/25/2022 1359 Last data filed at 12/24/2022 2130 Gross per 24 hour  Intake 120 ml  Output --  Net 120 ml   Wt Readings from Last 3 Encounters:  12/25/22 125.2 kg  11/28/22 127.5 kg  10/19/22 132.9 kg    Examination:    General: Appearance:    Obese female in  no acute distress     Lungs:     respirations unlabored  Heart:    Tachycardic.   MS:   All extremities are intact.   Neurologic:   Awake, alert, poor insight into diease process     Data Reviewed: I have independently reviewed following labs and imaging studies   CBC Recent Labs  Lab 12/20/22 0259 12/21/22 0820 12/22/22 0701 12/24/22 0712 12/25/22 0227  WBC 21.7* 22.9* 25.7* 22.0* 20.2*  HGB 6.9* 7.8* 7.3* 7.6* 7.0*  HCT 23.8* 25.8* 24.1* 25.4* 23.7*  PLT 448* 492* 430* 345 243  MCV 95.6 94.5 93.1 95.8 96.0  MCH 27.7 28.6 28.2 28.7 28.3  MCHC 29.0* 30.2 30.3 29.9* 29.5*  RDW 17.1* 17.3* 17.2* 16.9* 17.0*  LYMPHSABS 1.8  --   --   --   --   MONOABS 0.9  --   --   --   --   EOSABS 0.4  --   --   --   --   BASOSABS 0.1  --   --   --   --     Recent Labs  Lab 12/20/22 0259 12/21/22 0820 12/21/22 1342 12/22/22 0701 12/24/22 0712 12/25/22 0227  NA 137 135  --  135 134* 135  K 3.5 3.4*  --  3.3* 3.2* 3.6  CL 97* 97*  --  98 97* 101  CO2 23 26  --  26 23 21*  GLUCOSE 61* 108*  --  100* 107* 88  BUN 34* 36*  --  20 18 18   CREATININE 5.58* 5.61*  --  3.93* 4.19* 4.09*  CALCIUM 8.1* 8.3*  --  8.2* 8.6* 8.0*  AST 7*  --   --  8*  --   --   ALT  --   --   --  7  --   --   ALKPHOS 87  --   --  79  --   --   BILITOT  --   --   --  0.9  --   --   ALBUMIN 1.8* 1.9*  --  1.8*  --   --   MG 1.5*  --   --  1.8  --   --   CRP  --   --  14.8*  --   --   --     ------------------------------------------------------------------------------------------------------------------  No results for input(s): "CHOL", "HDL", "LDLCALC", "TRIG", "CHOLHDL", "LDLDIRECT" in the last 72 hours.  Lab Results  Component Value Date   HGBA1C 8.1 (H) 06/19/2022   ------------------------------------------------------------------------------------------------------------------ No results for input(s): "TSH", "T4TOTAL", "T3FREE", "THYROIDAB" in the last 72 hours.  Invalid input(s):  "FREET3"  Cardiac Enzymes No results for input(s): "CKMB", "TROPONINI", "MYOGLOBIN" in the last 168 hours.  Invalid input(s): "CK" ------------------------------------------------------------------------------------------------------------------    Component Value Date/Time   BNP 368.9 (H) 12/14/2022 1700    CBG: Recent Labs  Lab 12/23/22 2220 12/24/22 1345 12/24/22 1655 12/24/22 2105 12/25/22 1209  GLUCAP 215* 138* 100* 136* 162*    No results found for this or any previous visit (from the past 240 hour(s)).   Radiology Studies: No results found.   Marlin Canary DO [facility]  Between 7 am - 7 pm I am available, please contact me via Amion (for emergencies) or Securechat (non urgent messages)  Between 7 pm - 7 am I am not available, please contact night coverage MD/APP via Amion

## 2022-12-26 DIAGNOSIS — D649 Anemia, unspecified: Secondary | ICD-10-CM | POA: Diagnosis not present

## 2022-12-26 LAB — GLUCOSE, CAPILLARY
Glucose-Capillary: 121 mg/dL — ABNORMAL HIGH (ref 70–99)
Glucose-Capillary: 253 mg/dL — ABNORMAL HIGH (ref 70–99)
Glucose-Capillary: 213 mg/dL — ABNORMAL HIGH (ref 70–99)

## 2022-12-26 MED ORDER — DOXERCALCIFEROL 4 MCG/2ML IV SOLN
7.0000 ug | INTRAVENOUS | Status: DC
  Administered 2022-12-27 – 2023-01-10 (×7): 7 ug via INTRAVENOUS
  Filled 2022-12-26 (×15): qty 4

## 2022-12-26 MED ORDER — RISAQUAD PO CAPS
2.0000 | ORAL_CAPSULE | Freq: Every day | ORAL | Status: DC
  Administered 2022-12-26 – 2023-01-03 (×7): 2 via ORAL
  Filled 2022-12-26 (×8): qty 2

## 2022-12-26 NOTE — Progress Notes (Signed)
Pt c/o increasing pain and offered PO dilaudid. Pt refused stating she will wait until she can have IV dilaudid again.  Pt has had multiple episodes of liquid diarrhea. Pt states normal for her and she takes Imodium regularly at home. No imodium ordered. Contacted  on call A. Virgel Manifold, NP who stated she was unable to order d/t possible GI bleed. External rectal pouch suggested. Pt has multiple ulcers/wounds on buttocks, thighs, rectal area. Will attempt rectal pouch if diarrhea persists avoiding ulcers/wounds when applying.

## 2022-12-26 NOTE — Progress Notes (Signed)
   12/25/22 2121  Hygiene  CHG  Bath Completed  (PT informed this NT that she is allergic to CHG wipes.)

## 2022-12-26 NOTE — Progress Notes (Signed)
PROGRESS NOTE  Deborah [name] XLK:[id number] DOB: August 21, 1965 DOA: 12/14/2022 PCP: Pcp, No   LOS: 12 days   Brief Narrative / Interim history: 57 y.o. female with hx of ESRD on M/W/F HD, PAF on Eliquis, HFpEF, HTN, DM-2, malignant carcinoid, NAFLD, morbid obesity, chronic ulceration R ankle, recent admission 9/16 -24 initially suspected sepsis from SSTI but ultimately sepsis ruled out, and antibiotics were stopped. That admission underwent angiogram which showed adequate perfusion of the R foot. Treated for gout involving R ankle. Returns due to initial complaint of abd pain, per ED note 1 day history persistent abd pain. Had also missed last HD session, last session on 10/4. Found to have anemia, with Hb of 5.8. On further history, per RN report patients mother provided collateral of 1 week of bright red blood per rectum.  Hospital stay complicated by repeat GI bleed, needing EGD/capsule endoscopy.  Also has osteo and need surgery.  Dr. Lajoyce Corners following  Subjective / 24h Interval events: Eating more now that not on low salt diet   Assesement and Plan:  Acute on chronic anemia, concern for acute blood loss anemia due to lower GI bleed -gastroenterology consulted, appreciate input.  They saw her last week, and without overt bleeding recommended monitoring.  Hemoglobin has remained stable over the weekend, and upon resumption of her Eliquis hemoglobin dropped again to 6.9 -She was transfused a second unit of packed red blood cells on 10/14.Gastroenterology was consulted and she is s/p [id number] reflux esophagitis in the lower esophagus, normal-appearing duodenum. Had 3 small AVMs 1 with adherent clot that was washed off and revealed active bleeding   high-dose PPI to try to prophylax, but some degree of bleeding could be inevitable if she has to restart anticoagulation. Not a candidate for Carafate long-term given her ESRD. Can give for the next 1-2 weeks, then needs to stop.  -had capsule  endoscopy: - Recommend colonoscopy with ileoscopy.  If hemodynamically stable, no active bleeding, can be pursued as an outpatient - Daily CBC checks with blood products as needed per protocol - Continue high-dose PPI -hgb trending down but suspect related to not getting a full HD  ESRD -nephrology consulted, appreciate input.   - HD 10/18- partial  A-fib-given new drop in hemoglobin with resumption of her Eliquis, hold Eliquis indefinitely for now (at least until after surgery)  Chronic right heel wound, concern for acute osteomyelitis-last month she underwent an MRI that did not show any osteomyelitis.  Given persistent leukocytosis I have repeated the MRI on 10/14 which does show now early osteomyelitis.  Dr. Lajoyce Corners consulted, appreciate input. -Nontoxic-appearing, hold antibiotics.  Needs at least a bone biopsy unless Dr. Lajoyce Corners offers an amputation and patient is agreeable- -he plans to speak with family again this weekend  Acute toxic encephalopathy, possibly due to opiate medication -monitor,  home Dilaudid dose.  MRI negative.    Leukocytosis-recently on steroids for gout.  She is afebrile, but MRI does show concerns for osteomyelitis to the right heel  Metastatic duodenal carcinoid-had resection in the past  Chronic diastolic CHF-euvolemic, volume management per HD  Chronic pain- home Dilaudid, IV PRN dilaudid for SEVERE pain  Hyperlipidemia, NAFLD-continue statin  Mood disorder-continue duloxetine, added diazepam  Gout-continue home allopurinol  Chronic wounds-wound care consult.  Right foot MRI done just 2 weeks ago without evidence of osteomyelitis  Chronic back pain, spinal stenosis, lumbar region with neurogenic claudication -now wheelchair-bound for several months, underwent an MRI of the lumbar spine earlier this year.  It showed lower lumbar spondylosis worsening since 2019, worst at L4-5, with facet arthropathy with joint effusion and periarticular edema and unchanged  severe right neuroforaminal narrowing at L5-S1.  Neurosurgery evaluated patient March 2024 and did not recommend surgical approach at this point but continued PT, outpatient follow-up -PT recommends SNF, family in agreement    Scheduled Meds:  (feeding supplement) PROSource Plus  30 mL Oral BID BM   allopurinol  100 mg Oral BID   atorvastatin  10 mg Oral QHS   busPIRone  10 mg Oral BID   darbepoetin (ARANESP) injection - DIALYSIS  200 mcg Subcutaneous Q Fri-1800   [START ON 12/27/2022] doxercalciferol  7 mcg Intravenous Q M,W,F-HD   DULoxetine  40 mg Oral Daily   ferric citrate  420 mg Oral TID WC   folic acid  1 mg Oral Daily   Gerhardt's butt cream   Topical BID   insulin aspart  0-6 Units Subcutaneous TID WC   insulin aspart  2 Units Subcutaneous TID WC   insulin glargine-yfgn  8 Units Subcutaneous QHS   leptospermum manuka honey  1 Application Topical Daily   midodrine  5 mg Oral TID WC   mirtazapine  7.5 mg Oral QHS   pantoprazole  40 mg Oral BID AC   sucralfate  1 g Oral TID WC & HS   Continuous Infusions:     PRN Meds:.acetaminophen, albuterol, haloperidol lactate, HYDROmorphone (DILAUDID) injection, HYDROmorphone, ondansetron (ZOFRAN) IV  Current Outpatient Medications  Medication Instructions   albuterol (PROVENTIL) 2.5 mg, Nebulization, Every 6 hours PRN   allopurinol (ZYLOPRIM) 100 mg, Oral, 2 times daily   apixaban (ELIQUIS) 5 mg, Oral, 2 times daily   atorvastatin (LIPITOR) 10 mg, Oral, Daily   busPIRone (BUSPAR) 5 mg, Oral, 2 times daily   carvedilol (COREG) 12.5 mg, Oral, 2 times daily   cetirizine (ZYRTEC) 10 mg, Oral, Daily   colchicine 0.3 mg, Oral, 2 times weekly   doxercalciferol (HECTOROL) 4 mcg, Intravenous, Every M-W-F (Hemodialysis)   EASY COMFORT PEN NEEDLES 31G X 5 MM MISC USE 3 TIMES A DAY FOR INSULIN ADMINISTRATION   famotidine (PEPCID) 20 mg, Oral, Daily   ferric citrate (AURYXIA) 420 mg, Oral, 3 times daily with meals   fluticasone (FLONASE)  50 MCG/ACT nasal spray 2 sprays, Each Nare, Daily PRN   gabapentin (NEURONTIN) 100 mg, Oral, 3 times daily   hydrALAZINE (APRESOLINE) 50 mg, Oral, 3 times daily   HYDROmorphone (DILAUDID) 4 mg, Oral, 3 times daily PRN, Three times a day as needed 20 days out of the month.   insulin lispro (HUMALOG) 100 UNIT/ML KwikPen Before each meal 3 times a day, 140-199 - 2 units, 200-250 - 6 units, 251-299 - 8 units,  300-349 - 12 units,  350 or above 14 units.   isosorbide mononitrate (IMDUR) 60 mg, Oral, Daily   midodrine (PROAMATINE) 5 mg, Oral, 3 times daily with meals   mirtazapine (REMERON) 15 mg, Oral, Daily at bedtime   pantoprazole (PROTONIX) 40 mg, Oral, 2 times daily   spironolactone (ALDACTONE) 12.5 mg, Oral, Daily   torsemide (DEMADEX) 50 mg, Oral, 2 times daily    Diet Orders (From admission, onward)     Start     Ordered   12/25/22 1136  Diet Carb Modified Fluid consistency: Thin; Room service appropriate? Yes  Diet effective now       Question Answer Comment  Diet-HS Snack? Nothing   Calorie Level Medium 1600-2000   Fluid consistency:  Thin   Room service appropriate? Yes      12/25/22 1135            DVT prophylaxis: SCDs Start: 12/14/22 2037   Lab Results  Component Value Date   PLT 243 12/25/2022      Code Status: Full Code    Status is: Inpatient Remains inpatient appropriate because: osteomyelitis-- eventual SNF  Level of care: Telemetry Medical  Consultants:  Nephrology GI ortho  Objective: Vitals:   12/25/22 2121 12/26/22 0500 12/26/22 0515 12/26/22 0824  BP: (!) 148/70  (!) 115/57 (!) 113/42  Pulse: (!) 108  (!) 102 98  Resp:    18  Temp: 98.2 F (36.8 C)  98 F (36.7 C) 97.7 F (36.5 C)  TempSrc: Oral   Oral  SpO2: 100%  99% 100%  Weight:  124.1 kg    Height:        Intake/Output Summary (Last 24 hours) at 12/26/2022 1054 Last data filed at 12/25/2022 2011 Gross per 24 hour  Intake --  Output 1 ml  Net -1 ml   Wt Readings from  Last 3 Encounters:  12/26/22 124.1 kg  11/28/22 127.5 kg  10/19/22 132.9 kg    Examination:    General: Appearance:    Obese female in no acute distress-- much more interactive today     Lungs:     respirations unlabored  Heart:    Normal heart rate.   MS:   All extremities are intact.   Neurologic:   Awake, alert, poor insight into diease process     Data Reviewed: I have independently reviewed following labs and imaging studies   CBC Recent Labs  Lab 12/20/22 0259 12/21/22 0820 12/22/22 0701 12/24/22 0712 12/25/22 0227  WBC 21.7* 22.9* 25.7* 22.0* 20.2*  HGB 6.9* 7.8* 7.3* 7.6* 7.0*  HCT 23.8* 25.8* 24.1* 25.4* 23.7*  PLT 448* 492* 430* 345 243  MCV 95.6 94.5 93.1 95.8 96.0  MCH 27.7 28.6 28.2 28.7 28.3  MCHC 29.0* 30.2 30.3 29.9* 29.5*  RDW 17.1* 17.3* 17.2* 16.9* 17.0*  LYMPHSABS 1.8  --   --   --   --   MONOABS 0.9  --   --   --   --   EOSABS 0.4  --   --   --   --   BASOSABS 0.1  --   --   --   --     Recent Labs  Lab 12/20/22 0259 12/21/22 0820 12/21/22 1342 12/22/22 0701 12/24/22 0712 12/25/22 0227  NA 137 135  --  135 134* 135  K 3.5 3.4*  --  3.3* 3.2* 3.6  CL 97* 97*  --  98 97* 101  CO2 23 26  --  26 23 21*  GLUCOSE 61* 108*  --  100* 107* 88  BUN 34* 36*  --  20 18 18   CREATININE 5.58* 5.61*  --  3.93* 4.19* 4.09*  CALCIUM 8.1* 8.3*  --  8.2* 8.6* 8.0*  AST 7*  --   --  8*  --   --   ALT  --   --   --  7  --   --   ALKPHOS 87  --   --  79  --   --   BILITOT  --   --   --  0.9  --   --   ALBUMIN 1.8* 1.9*  --  1.8*  --   --   MG  1.5*  --   --  1.8  --   --   CRP  --   --  14.8*  --   --   --     ------------------------------------------------------------------------------------------------------------------ No results for input(s): "CHOL", "HDL", "LDLCALC", "TRIG", "CHOLHDL", "LDLDIRECT" in the last 72 hours.  Lab Results  Component Value Date   HGBA1C 8.1 (H) 06/19/2022    ------------------------------------------------------------------------------------------------------------------ No results for input(s): "TSH", "T4TOTAL", "T3FREE", "THYROIDAB" in the last 72 hours.  Invalid input(s): "FREET3"  Cardiac Enzymes No results for input(s): "CKMB", "TROPONINI", "MYOGLOBIN" in the last 168 hours.  Invalid input(s): "CK" ------------------------------------------------------------------------------------------------------------------    Component Value Date/Time   BNP 368.9 (H) 12/14/2022 1700    CBG: Recent Labs  Lab 12/24/22 1655 12/24/22 2105 12/25/22 1209 12/25/22 1759 12/25/22 2123  GLUCAP 100* 136* 162* 247* 179*    No results found for this or any previous visit (from the past 240 hour(s)).   Radiology Studies: No results found.   Marlin Canary DO [facility]  Between 7 am - 7 pm I am available, please contact me via Amion (for emergencies) or Securechat (non urgent messages)  Between 7 pm - 7 am I am not available, please contact night coverage MD/APP via Amion

## 2022-12-26 NOTE — Progress Notes (Signed)
[facility] Progress Note   Subjective:   Patient seen and examined at bedside.  Requests to have dialysis on 3rd shift.  Discussed we can not guarantee a specific time for dialysis in the hospital due to potential emergencies and patient acuity.  Currently not having pain.  Admits to nausea but no vomiting this AM.  Denies CP, SOB, abdominal pain and n/v/d.   Objective Vitals:   12/25/22 0700 12/25/22 2121 12/26/22 0500 12/26/22 0515  BP: (!) 126/53 (!) 148/70  (!) 115/57  Pulse: 100 (!) 108  (!) 102  Resp: 20     Temp: 99 F (37.2 C) 98.2 F (36.8 C)  98 F (36.7 C)  TempSrc: Oral Oral    SpO2: 99% 100%  99%  Weight:   124.1 kg   Height:       Physical Exam General:chronically ill appearing female in NAD Heart:RRR, no mrg Lungs:CTAB, nml WOB on RA Abdomen:soft, NTND Extremities:trace LE edema Dialysis Access: [facility]   Filed Weights   12/24/22 1112 12/25/22 0500 12/26/22 0500  Weight: 125.1 kg 125.2 kg 124.1 kg    Intake/Output Summary (Last 24 hours) at 12/26/2022 0817 Last data filed at 12/25/2022 2011 Gross per 24 hour  Intake --  Output 1 ml  Net -1 ml    Additional Objective Labs: Basic Metabolic Panel: Recent Labs  Lab 12/20/22 0259 12/21/22 0820 12/22/22 0701 12/24/22 0712 12/25/22 0227  NA 137 135 135 134* 135  K 3.5 3.4* 3.3* 3.2* 3.6  CL 97* 97* 98 97* 101  CO2 23 26 26 23  21*  GLUCOSE 61* 108* 100* 107* 88  BUN 34* 36* 20 18 18   CREATININE 5.58* 5.61* 3.93* 4.19* 4.09*  CALCIUM 8.1* 8.3* 8.2* 8.6* 8.0*  PHOS 5.7* 5.0*  --  3.3  --    Liver Function Tests: Recent Labs  Lab 12/20/22 0259 12/21/22 0820 12/22/22 0701  AST 7*  --  8*  ALT  --   --  7  ALKPHOS 87  --  79  BILITOT  --   --  0.9  PROT 6.0*  --  5.9*  ALBUMIN 1.8* 1.9* 1.8*   CBC: Recent Labs  Lab 12/20/22 0259 12/21/22 0820 12/22/22 0701 12/24/22 0712 12/25/22 0227  WBC 21.7* 22.9* 25.7* 22.0* 20.2*  NEUTROABS 18.3*  --   --   --   --   HGB 6.9*  7.8* 7.3* 7.6* 7.0*  HCT 23.8* 25.8* 24.1* 25.4* 23.7*  MCV 95.6 94.5 93.1 95.8 96.0  PLT 448* 492* 430* 345 243    Medications:   (feeding supplement) PROSource Plus  30 mL Oral BID BM   allopurinol  100 mg Oral BID   atorvastatin  10 mg Oral QHS   busPIRone  10 mg Oral BID   darbepoetin (ARANESP) injection - DIALYSIS  200 mcg Subcutaneous Q Fri-1800   DULoxetine  40 mg Oral Daily   ferric citrate  420 mg Oral TID WC   folic acid  1 mg Oral Daily   Gerhardt's butt cream   Topical BID   insulin aspart  0-6 Units Subcutaneous TID WC   insulin aspart  2 Units Subcutaneous TID WC   insulin glargine-yfgn  8 Units Subcutaneous QHS   leptospermum manuka honey  1 Application Topical Daily   midodrine  5 mg Oral TID WC   mirtazapine  7.5 mg Oral QHS   pantoprazole  40 mg Oral BID AC   sucralfate  1 g Oral  TID WC & HS    Dialysis Orders: [facility]WF [facility]   F180   400/800   3K/2.5 bath  126.9kg   RDC   - mircera 225 mcg last 10/5 - hectorol 7 mcg   Assessment/Plan: ESRD: Back to usual MWF schedule - Ended early last HD d/t poorly functioning TDC.  Cath flow dwell. No acute indications for extra HD today.  Next HD 10/21.  Chronic Wounds - on R heel and LE. Wound care following. Likely needs amputation but has been refusing.  AMS (improving): MRI head completed with no acute changes. Per PMD BP/volume: BP stable, minimal edema on exam. Continue home meds, UF as tolerated. Below prior EDW - will need to lower on discharge. Acute on Chronic Anemia of ESRD: Hgb 7.0. S/p EGD Multiple gastric AVMs, treated with APC and capsule endo 10/17 w/ no acute bleeding but polyps.  Rec colonoscopy. Eliquis on hold. 2U PRBCs given this admit. Aranesp given yesterday, cont qwk. Abdominal pain: CT scan without acute concern. GI resumed Reglan. Due to #5? Secondary Hyperparathyroidism/Hyperphosphatemia: CorrCa/Phos good, continue meds. Vascular access - TDC in place. Not working well last HD requiring cath  flow dwell. If continues to be an issue may need to have it exchanged. Nutrition: Alb very low, start supplements.  K low, will change diet to carb modified  Virgina Norfolk, PA-C [facility] 12/26/2022,8:17 AM  LOS: 12 days

## 2022-12-26 NOTE — Progress Notes (Signed)
Wound care attempted x3. Patient refused. Educated on importance, patient still refused.

## 2022-12-26 NOTE — Progress Notes (Signed)
Patient refused all her PO medications, although she had said she would but complained of abdominal pain and nausea. PRNs were given and symptoms improved but patient still did not take the medications. Provider was notified.

## 2022-12-26 NOTE — Plan of Care (Signed)
  Problem: Education: Goal: Understanding of CV disease, CV risk reduction, and recovery process will improve Outcome: Progressing Goal: Individualized Educational Video(s) Outcome: Progressing   Problem: Activity: Goal: Ability to return to baseline activity level will improve Outcome: Progressing   Problem: Cardiovascular: Goal: Ability to achieve and maintain adequate cardiovascular perfusion will improve Outcome: Progressing Goal: Vascular access site(s) Level 0-1 will be maintained Outcome: Progressing   Problem: Health Behavior/Discharge Planning: Goal: Ability to safely manage health-related needs after discharge will improve Outcome: Progressing   Problem: Education: Goal: Ability to describe self-care measures that may prevent or decrease complications (Diabetes Survival Skills Education) will improve Outcome: Progressing Goal: Individualized Educational Video(s) Outcome: Progressing   Problem: Coping: Goal: Ability to adjust to condition or change in health will improve Outcome: Progressing   Problem: Fluid Volume: Goal: Ability to maintain a balanced intake and output will improve Outcome: Progressing   Problem: Health Behavior/Discharge Planning: Goal: Ability to identify and utilize available resources and services will improve Outcome: Progressing Goal: Ability to manage health-related needs will improve Outcome: Progressing   Problem: Metabolic: Goal: Ability to maintain appropriate glucose levels will improve Outcome: Progressing   Problem: Nutritional: Goal: Progress toward achieving an optimal weight will improve Outcome: Progressing   Problem: Skin Integrity: Goal: Risk for impaired skin integrity will decrease Outcome: Progressing   Problem: Tissue Perfusion: Goal: Adequacy of tissue perfusion will improve Outcome: Progressing   Problem: Education: Goal: Knowledge of General Education information will improve Description: Including pain  rating scale, medication(s)/side effects and non-pharmacologic comfort measures Outcome: Progressing   Problem: Health Behavior/Discharge Planning: Goal: Ability to manage health-related needs will improve Outcome: Progressing   Problem: Clinical Measurements: Goal: Ability to maintain clinical measurements within normal limits will improve Outcome: Progressing Goal: Will remain free from infection Outcome: Progressing Goal: Diagnostic test results will improve Outcome: Progressing Goal: Respiratory complications will improve Outcome: Progressing Goal: Cardiovascular complication will be avoided Outcome: Progressing   Problem: Activity: Goal: Risk for activity intolerance will decrease Outcome: Progressing   Problem: Nutrition: Goal: Adequate nutrition will be maintained Outcome: Progressing   Problem: Coping: Goal: Level of anxiety will decrease Outcome: Progressing   Problem: Elimination: Goal: Will not experience complications related to bowel motility Outcome: Progressing   Problem: Pain Managment: Goal: General experience of comfort will improve Outcome: Progressing   Problem: Safety: Goal: Ability to remain free from injury will improve Outcome: Progressing   Problem: Skin Integrity: Goal: Risk for impaired skin integrity will decrease Outcome: Progressing

## 2022-12-27 ENCOUNTER — Encounter (HOSPITAL_COMMUNITY): Payer: Self-pay | Admitting: Gastroenterology

## 2022-12-27 DIAGNOSIS — D649 Anemia, unspecified: Secondary | ICD-10-CM | POA: Diagnosis not present

## 2022-12-27 LAB — BASIC METABOLIC PANEL
Anion gap: 8 (ref 5–15)
BUN: 27 mg/dL — ABNORMAL HIGH (ref 6–20)
CO2: 23 mmol/L (ref 22–32)
Calcium: 7.9 mg/dL — ABNORMAL LOW (ref 8.9–10.3)
Chloride: 105 mmol/L (ref 98–111)
Creatinine, Ser: 4.7 mg/dL — ABNORMAL HIGH (ref 0.44–1.00)
GFR, Estimated: 10 mL/min — ABNORMAL LOW (ref >60)
Glucose, Bld: 115 mg/dL — ABNORMAL HIGH (ref 70–99)
Potassium: 2.7 mmol/L — CL (ref 3.5–5.1)
Sodium: 136 mmol/L (ref 135–145)

## 2022-12-27 LAB — GLUCOSE, CAPILLARY
Glucose-Capillary: 111 mg/dL — ABNORMAL HIGH (ref 70–99)
Glucose-Capillary: 119 mg/dL — ABNORMAL HIGH (ref 70–99)
Glucose-Capillary: 211 mg/dL — ABNORMAL HIGH (ref 70–99)

## 2022-12-27 LAB — CBC
HCT: 25.5 % — ABNORMAL LOW (ref 36.0–46.0)
Hemoglobin: 7.6 g/dL — ABNORMAL LOW (ref 12.0–15.0)
MCH: 28.7 pg (ref 26.0–34.0)
MCHC: 29.8 g/dL — ABNORMAL LOW (ref 30.0–36.0)
MCV: 96.2 fL (ref 80.0–100.0)
Platelets: 342 10*3/uL (ref 150–400)
RBC: 2.65 MIL/uL — ABNORMAL LOW (ref 3.87–5.11)
RDW: 16.5 % — ABNORMAL HIGH (ref 11.5–15.5)
WBC: 22.8 10*3/uL — ABNORMAL HIGH (ref 4.0–10.5)
nRBC: 0 % (ref 0.0–0.2)

## 2022-12-27 MED ORDER — HEPARIN SODIUM (PORCINE) 1000 UNIT/ML IJ SOLN
INTRAMUSCULAR | Status: AC
  Administered 2022-12-27: 3800 [IU]
  Filled 2022-12-27: qty 4

## 2022-12-27 MED ORDER — POTASSIUM CHLORIDE 10 MEQ/100ML IV SOLN
10.0000 meq | Freq: Once | INTRAVENOUS | Status: AC
  Administered 2022-12-27: 10 meq via INTRAVENOUS
  Filled 2022-12-27: qty 100

## 2022-12-27 MED ORDER — POTASSIUM CHLORIDE CRYS ER 20 MEQ PO TBCR: 30.0000 meq | EXTENDED_RELEASE_TABLET | Freq: Two times a day (BID) | ORAL | Status: DC

## 2022-12-27 MED ORDER — LOPERAMIDE HCL 2 MG PO CAPS
2.0000 mg | ORAL_CAPSULE | ORAL | Status: DC | PRN
  Administered 2022-12-27 – 2023-01-10 (×13): 2 mg via ORAL
  Filled 2022-12-27 (×13): qty 1

## 2022-12-27 MED ORDER — NEPRO/CARBSTEADY PO LIQD
237.0000 mL | Freq: Two times a day (BID) | ORAL | Status: DC
Start: 2022-12-27 — End: 2023-01-10
  Administered 2023-01-01 – 2023-01-07 (×3): 237 mL via ORAL

## 2022-12-27 NOTE — TOC Progression Note (Signed)
Transition of Care [facility]) - Progression Note    Patient Details  Name: Deborah [name] MRN: [id number] Date of Birth: 03/16/1965  [facility] CM/SW Contact  Carley Hammed, LCSW Phone Number: 12/27/2022, 11:44 AM  Clinical Narrative:     TOC continues to follow for disposition and DC needs. Noting Orthopedics following and will discuss with pt again this week, will complete assessment and workup closer to being medically ready. Please consult for any further needs.          Expected Discharge Plan and Services                                               Social Determinants of Health (SDOH) Interventions SDOH Screenings   Food Insecurity: No Food Insecurity (12/15/2022)  Housing: Patient Declined (12/15/2022)  Transportation Needs: No Transportation Needs (12/15/2022)  Utilities: Not At Risk (12/15/2022)  Depression (PHQ2-9): High Risk (11/18/2022)  Financial Resource Strain: Low Risk (03/23/2021)   [facility])  Physical Activity: Not on File (10/31/2017)   Received from [place], [place]  Social Connections: Unknown (07/19/2021)   Received from [facility]  Stress: Low Risk (03/23/2021)   Received from [facility])  Tobacco Use: Low Risk  (12/23/2022)    Readmission Risk Interventions    11/25/2022    5:16 PM 07/05/2022    1:20 PM  Readmission Risk Prevention Plan  Transportation Screening Complete Complete  Medication Review (RN Care Manager) Complete Referral to Pharmacy  PCP or Specialist appointment within 3-5 days of discharge Complete Complete  HRI or Home Care Consult Complete Complete  SW Recovery Care/Counseling Consult Complete Complete  Palliative Care Screening Not Applicable Not Applicable  Skilled Nursing Facility Not Applicable Not Applicable

## 2022-12-27 NOTE — Progress Notes (Signed)
Patient refused her morning vitals at the moment. She rather have them taken later.

## 2022-12-27 NOTE — Progress Notes (Signed)
Subjective: Woken from sleep seen in room, HD today on schedule.  Complaints of some  mild nausea currently no pain  Objective Vital signs in last 24 hours: Vitals:   12/26/22 0824 12/26/22 1637 12/26/22 1939 12/27/22 0744  BP: (!) 113/42 123/60 135/65 (!) 106/57  Pulse: 98 100 95 100  Resp: 18 18  16   Temp: 97.7 F (36.5 C) 97.8 F (36.6 C) 98 F (36.7 C) 98.6 F (37 C)  TempSrc: Oral Oral Oral Oral  SpO2: 100% 100% 100% 100%  Weight:      Height:       Weight change:   Physical Exam: General: Chronically ill obese female NAD Heart: RRR no MRG Lungs: CTA bilaterally nonlabored breathing Abdomen: Obese NABS soft NTND Extremities: Bilateral heels with dressings dry clean, foul odor, 1+ bipedal edema Dialysis Access: Right IJ TDC  Dialysis Orders: MWF [facility]   F180   400/800   3K/2.5 bath  126.9kg   RDC   - mircera 225 mcg last 10/5 - hectorol 7 mcg   Problem/Plan: ESRD: Back to usual MWF schedule - Ended early last HD d/t poorly functioning TDC.  Cath flow dwell.  Chronic Wounds - on R heel and LE.  Per admit team, Dr. Lajoyce Corners to evaluate, wound care following.  AMS (improving): MRI head completed with no acute changes.  Could be related to infection, workup per PMD BP/volume: BP stable, edema on exam. Continue home meds, UF as tolerated. Below prior EDW - lower on discharge. Acute on Chronic Anemia of ESRD: Hgb 7.0. S/p EGD Multiple gastric AVMs, treated with APC and capsule endo 10/17 w/ no acute bleeding but polyps.  Rec colonoscopy. Eliquis on hold. 2U PRBCs given this admit. Aranesp given 10/19, cont qwk. Abdominal pain: CT scan without acute concern. GI resumed Reglan. Due to #5? Secondary Hyperparathyroidism/Hyperphosphatemia: CorrCa/Phos good, continue meds. Vascular access - TDC in place. Not working well last HD requiring cath flow dwell. If continues to be an issue may need to have it exchanged. Nutrition: Phosphorus 3.3 follow-up Alb low as outpatient, K 3.6,   changed diet to carb modified, follow-up labs   Lenny Pastel, PA-C [facility] [phone] 12/27/2022,8:49 AM  LOS: 13 days   Labs: Basic Metabolic Panel: Recent Labs  Lab 12/21/22 0820 12/22/22 0701 12/24/22 0712 12/25/22 0227  NA 135 135 134* 135  K 3.4* 3.3* 3.2* 3.6  CL 97* 98 97* 101  CO2 26 26 23  21*  GLUCOSE 108* 100* 107* 88  BUN 36* 20 18 18   CREATININE 5.61* 3.93* 4.19* 4.09*  CALCIUM 8.3* 8.2* 8.6* 8.0*  PHOS 5.0*  --  3.3  --    Liver Function Tests: Recent Labs  Lab 12/21/22 0820 12/22/22 0701  AST  --  8*  ALT  --  7  ALKPHOS  --  79  BILITOT  --  0.9  PROT  --  5.9*  ALBUMIN 1.9* 1.8*   No results for input(s): "LIPASE", "AMYLASE" in the last 168 hours. No results for input(s): "AMMONIA" in the last 168 hours. CBC: Recent Labs  Lab 12/21/22 0820 12/22/22 0701 12/24/22 0712 12/25/22 0227  WBC 22.9* 25.7* 22.0* 20.2*  HGB 7.8* 7.3* 7.6* 7.0*  HCT 25.8* 24.1* 25.4* 23.7*  MCV 94.5 93.1 95.8 96.0  PLT 492* 430* 345 243   Cardiac Enzymes: No results for input(s): "CKTOTAL", "CKMB", "CKMBINDEX", "TROPONINI" in the last 168 hours. CBG: Recent Labs  Lab 12/25/22 1759 12/25/22 2123 12/26/22 1151  12/26/22 1637 12/26/22 1946  GLUCAP 247* 179* 121* 253* 213*    Studies/Results: No results found. Medications:   (feeding supplement) PROSource Plus  30 mL Oral BID BM   acidophilus  2 capsule Oral Daily   allopurinol  100 mg Oral BID   atorvastatin  10 mg Oral QHS   busPIRone  10 mg Oral BID   darbepoetin (ARANESP) injection - DIALYSIS  200 mcg Subcutaneous Q Fri-1800   doxercalciferol  7 mcg Intravenous Q M,W,F-HD   DULoxetine  40 mg Oral Daily   ferric citrate  420 mg Oral TID WC   folic acid  1 mg Oral Daily   Gerhardt's butt cream   Topical BID   insulin aspart  0-6 Units Subcutaneous TID WC   insulin aspart  2 Units Subcutaneous TID WC   insulin glargine-yfgn  8 Units Subcutaneous QHS   leptospermum manuka  honey  1 Application Topical Daily   midodrine  5 mg Oral TID WC   mirtazapine  7.5 mg Oral QHS   pantoprazole  40 mg Oral BID AC   sucralfate  1 g Oral TID WC & HS

## 2022-12-27 NOTE — Progress Notes (Signed)
PROGRESS NOTE  Deborah [name] MWU:[id number] DOB: 06-Jan-1966 DOA: 12/14/2022 PCP: Pcp, No   LOS: 13 days   Brief Narrative / Interim history: 57 y.o. female with hx of ESRD on M/W/F HD, PAF on Eliquis, HFpEF, HTN, DM-2, malignant carcinoid, NAFLD, morbid obesity, chronic ulceration R ankle, recent admission 9/16 -24 initially suspected sepsis from SSTI but ultimately sepsis ruled out, and antibiotics were stopped. That admission underwent angiogram which showed adequate perfusion of the R foot. Treated for gout involving R ankle. Returns due to initial complaint of abd pain, per ED note 1 day history persistent abd pain. Had also missed last HD session, last session on 10/4. Found to have anemia, with Hb of 5.8. On further history, per RN report patients mother provided collateral of 1 week of bright red blood per rectum.  Hospital stay complicated by repeat GI bleed, needing EGD/capsule endoscopy.  Also has osteo and need surgery.  Dr. Lajoyce Corners following and patient to decide on amputation  Subjective / 24h Interval events: Waiting to hear from Dr. Lajoyce Corners about amputation    Assesement and Plan:  Acute on chronic anemia, concern for acute blood loss anemia due to lower GI bleed -gastroenterology consulted, appreciate input.  They saw her last week, and without overt bleeding recommended monitoring.  Hemoglobin has remained stable over the weekend, and upon resumption of her Eliquis hemoglobin dropped again to 6.9 -She was transfused a second unit of packed red blood cells on 10/14.Gastroenterology was consulted and she is s/p VOZ:DGUYQI reflux esophagitis in the lower esophagus, normal-appearing duodenum. Had 3 small AVMs 1 with adherent clot that was washed off and revealed active bleeding   high-dose PPI to try to prophylax, but some degree of bleeding could be inevitable if she has to restart anticoagulation. Not a candidate for Carafate long-term given her ESRD. Can give for the next 1-2 weeks,  then needs to stop.  -had capsule endoscopy: - Recommend colonoscopy with ileoscopy.  If hemodynamically stable, no active bleeding, can be pursued as an outpatient - Daily CBC checks with blood products as needed per protocol - Continue high-dose PPI  ESRD -nephrology consulted, appreciate input.   - HD 10/18- partial  A-fib-given new drop in hemoglobin with resumption of her Eliquis, hold Eliquis indefinitely for now (at least until after surgery)  Chronic right heel wound, concern for acute osteomyelitis-last month she underwent an MRI that did not show any osteomyelitis.  Given persistent leukocytosis I have repeated the MRI on 10/14 which does show now early osteomyelitis.  Dr. Lajoyce Corners consulted, appreciate input. -Nontoxic-appearing, hold antibiotics.  Needs at least a bone biopsy unless Dr. Lajoyce Corners offers an amputation and patient is agreeable- -he plans to speak with family again this week  Acute toxic encephalopathy, possibly due to opiate medication -monitor,  home Dilaudid dose.  MRI negative.    Leukocytosis-recently on steroids for gout.  She is afebrile, but MRI does show concerns for osteomyelitis to the right heel  Metastatic duodenal carcinoid-had resection in the past  Chronic diastolic CHF-euvolemic, volume management per HD  Chronic pain- home Dilaudid, IV PRN dilaudid for SEVERE pain  Hyperlipidemia, NAFLD-continue statin  Mood disorder-continue duloxetine, added diazepam  Gout-continue home allopurinol  Chronic wounds-wound care consult.  Right foot MRI done just 2 weeks ago without evidence of osteomyelitis  Chronic back pain, spinal stenosis, lumbar region with neurogenic claudication -now wheelchair-bound for several months, underwent an MRI of the lumbar spine earlier this year. It showed lower lumbar spondylosis worsening  since 2019, worst at L4-5, with facet arthropathy with joint effusion and periarticular edema and unchanged severe right neuroforaminal  narrowing at L5-S1.  Neurosurgery evaluated patient March 2024 and did not recommend surgical approach at this point but continued PT, outpatient follow-up -PT recommends SNF, family in agreement  Hypokalemia -needs replacement   Scheduled Meds:  (feeding supplement) PROSource Plus  30 mL Oral BID BM   acidophilus  2 capsule Oral Daily   allopurinol  100 mg Oral BID   atorvastatin  10 mg Oral QHS   busPIRone  10 mg Oral BID   darbepoetin (ARANESP) injection - DIALYSIS  200 mcg Subcutaneous Q Fri-1800   doxercalciferol  7 mcg Intravenous Q M,W,F-HD   DULoxetine  40 mg Oral Daily   feeding supplement (NEPRO CARB STEADY)  237 mL Oral BID BM   ferric citrate  420 mg Oral TID WC   folic acid  1 mg Oral Daily   Gerhardt's butt cream   Topical BID   insulin aspart  0-6 Units Subcutaneous TID WC   insulin aspart  2 Units Subcutaneous TID WC   insulin glargine-yfgn  8 Units Subcutaneous QHS   leptospermum manuka honey  1 Application Topical Daily   midodrine  5 mg Oral TID WC   mirtazapine  7.5 mg Oral QHS   pantoprazole  40 mg Oral BID AC   sucralfate  1 g Oral TID WC & HS   Continuous Infusions:  potassium chloride        PRN Meds:.acetaminophen, albuterol, haloperidol lactate, HYDROmorphone (DILAUDID) injection, HYDROmorphone, loperamide, ondansetron (ZOFRAN) IV  Current Outpatient Medications  Medication Instructions   albuterol (PROVENTIL) 2.5 mg, Nebulization, Every 6 hours PRN   allopurinol (ZYLOPRIM) 100 mg, Oral, 2 times daily   apixaban (ELIQUIS) 5 mg, Oral, 2 times daily   atorvastatin (LIPITOR) 10 mg, Oral, Daily   busPIRone (BUSPAR) 5 mg, Oral, 2 times daily   carvedilol (COREG) 12.5 mg, Oral, 2 times daily   cetirizine (ZYRTEC) 10 mg, Oral, Daily   colchicine 0.3 mg, Oral, 2 times weekly   doxercalciferol (HECTOROL) 4 mcg, Intravenous, Every M-W-F (Hemodialysis)   EASY COMFORT PEN NEEDLES 31G X 5 MM MISC USE 3 TIMES A DAY FOR INSULIN ADMINISTRATION   famotidine  (PEPCID) 20 mg, Oral, Daily   ferric citrate (AURYXIA) 420 mg, Oral, 3 times daily with meals   fluticasone (FLONASE) 50 MCG/ACT nasal spray 2 sprays, Each Nare, Daily PRN   gabapentin (NEURONTIN) 100 mg, Oral, 3 times daily   hydrALAZINE (APRESOLINE) 50 mg, Oral, 3 times daily   HYDROmorphone (DILAUDID) 4 mg, Oral, 3 times daily PRN, Three times a day as needed 20 days out of the month.   insulin lispro (HUMALOG) 100 UNIT/ML KwikPen Before each meal 3 times a day, 140-199 - 2 units, 200-250 - 6 units, 251-299 - 8 units,  300-349 - 12 units,  350 or above 14 units.   isosorbide mononitrate (IMDUR) 60 mg, Oral, Daily   midodrine (PROAMATINE) 5 mg, Oral, 3 times daily with meals   mirtazapine (REMERON) 15 mg, Oral, Daily at bedtime   pantoprazole (PROTONIX) 40 mg, Oral, 2 times daily   spironolactone (ALDACTONE) 12.5 mg, Oral, Daily   torsemide (DEMADEX) 50 mg, Oral, 2 times daily    Diet Orders (From admission, onward)     Start     Ordered   12/25/22 1136  Diet Carb Modified Fluid consistency: Thin; Room service appropriate? Yes  Diet effective now  Question Answer Comment  Diet-HS Snack? Nothing   Calorie Level Medium 1600-2000   Fluid consistency: Thin   Room service appropriate? Yes      12/25/22 1135            DVT prophylaxis: SCDs Start: 12/14/22 2037   Lab Results  Component Value Date   PLT 342 12/27/2022      Code Status: Full Code    Status is: Inpatient Remains inpatient appropriate because: osteomyelitis-- eventual SNF  Level of care: Telemetry Medical  Consultants:  Nephrology GI ortho  Objective: Vitals:   12/26/22 0824 12/26/22 1637 12/26/22 1939 12/27/22 0744  BP: (!) 113/42 123/60 135/65 (!) 106/57  Pulse: 98 100 95 100  Resp: 18 18  16   Temp: 97.7 F (36.5 C) 97.8 F (36.6 C) 98 F (36.7 C) 98.6 F (37 C)  TempSrc: Oral Oral Oral Oral  SpO2: 100% 100% 100% 100%  Weight:      Height:        Intake/Output Summary (Last 24  hours) at 12/27/2022 1135 Last data filed at 12/26/2022 1456 Gross per 24 hour  Intake 240 ml  Output --  Net 240 ml   Wt Readings from Last 3 Encounters:  12/26/22 124.1 kg  11/28/22 127.5 kg  10/19/22 132.9 kg    Examination:     General: Appearance:    Severely obese female -sleepy but just got pain meds     Lungs:      respirations unlabored  Heart:    Tachycardic.   MS:   All extremities are intact.   Neurologic:   sleeping     Data Reviewed: I have independently reviewed following labs and imaging studies   CBC Recent Labs  Lab 12/21/22 0820 12/22/22 0701 12/24/22 0712 12/25/22 0227 12/27/22 0921  WBC 22.9* 25.7* 22.0* 20.2* 22.8*  HGB 7.8* 7.3* 7.6* 7.0* 7.6*  HCT 25.8* 24.1* 25.4* 23.7* 25.5*  PLT 492* 430* 345 243 342  MCV 94.5 93.1 95.8 96.0 96.2  MCH 28.6 28.2 28.7 28.3 28.7  MCHC 30.2 30.3 29.9* 29.5* 29.8*  RDW 17.3* 17.2* 16.9* 17.0* 16.5*    Recent Labs  Lab 12/21/22 0820 12/21/22 1342 12/22/22 0701 12/24/22 0712 12/25/22 0227 12/27/22 0921  NA 135  --  135 134* 135 136  K 3.4*  --  3.3* 3.2* 3.6 2.7*  CL 97*  --  98 97* 101 105  CO2 26  --  26 23 21* 23  GLUCOSE 108*  --  100* 107* 88 115*  BUN 36*  --  20 18 18  27*  CREATININE 5.61*  --  3.93* 4.19* 4.09* 4.70*  CALCIUM 8.3*  --  8.2* 8.6* 8.0* 7.9*  AST  --   --  8*  --   --   --   ALT  --   --  7  --   --   --   ALKPHOS  --   --  79  --   --   --   BILITOT  --   --  0.9  --   --   --   ALBUMIN 1.9*  --  1.8*  --   --   --   MG  --   --  1.8  --   --   --   CRP  --  14.8*  --   --   --   --     ------------------------------------------------------------------------------------------------------------------ No results for input(s): "CHOL", "HDL", "LDLCALC", "  TRIG", "CHOLHDL", "LDLDIRECT" in the last 72 hours.  Lab Results  Component Value Date   HGBA1C 8.1 (H) 06/19/2022    ------------------------------------------------------------------------------------------------------------------ No results for input(s): "TSH", "T4TOTAL", "T3FREE", "THYROIDAB" in the last 72 hours.  Invalid input(s): "FREET3"  Cardiac Enzymes No results for input(s): "CKMB", "TROPONINI", "MYOGLOBIN" in the last 168 hours.  Invalid input(s): "CK" ------------------------------------------------------------------------------------------------------------------    Component Value Date/Time   BNP 368.9 (H) 12/14/2022 1700    CBG: Recent Labs  Lab 12/25/22 2123 12/26/22 1151 12/26/22 1637 12/26/22 1946 12/27/22 0930  GLUCAP 179* 121* 253* 213* 111*    No results found for this or any previous visit (from the past 240 hour(s)).   Radiology Studies: No results found.   Marlin Canary DO [facility]  Between 7 am - 7 pm I am available, please contact me via Amion (for emergencies) or Securechat (non urgent messages)  Between 7 pm - 7 am I am not available, please contact night coverage MD/APP via Amion

## 2022-12-27 NOTE — Progress Notes (Signed)
This chaplain responded to the consult for spiritual care in the setting of possible surgery and Pt. request for prayer.   The chaplain observed the room was completely dark upon quiet entry. The chaplain exited the room and received an update from the RN-Dana. The chaplain understands the Pt. was given pain medicine earlier this morning and prefers to sleep. The RN updated the chaplain on the Pt. HD this afternoon. The chaplain will attempt a revisit in HD.   The chaplain appreciates the RN's shared plan of updating the Pt. of the chaplain visit.   Chaplain Stephanie Acre [phone]

## 2022-12-27 NOTE — Procedures (Addendum)
HD Note:  Some information was entered later than the data was gathered due to patient care needs. The stated time with the data is accurate.  Received patient in bed to unit. Patient stated she needed something for anxiety.   Alert and oriented.   Informed consent signed and in chart.   Access used: Right upper chest HD catheter Access issues: None  Patient becomes anxious during treatment.  She was given meds, see MAR.  She showed effort today in not yelling out as she waited for the medication.  She was very relaxed during treatment.  Her BP stayed low and she had an afib rhythm with pulse rates in the 140s.  UF was turned off and on to accommodate the fluctuations.  TX duration: 3 hours  Alert, without acute distress.  Total UF removed: 400  Hand-off given to oncoming dialysis nurse to be able to give report  to the patient's nurse. She was unavailable until 1930  Transported back to the room   Heloise Gordan L. Dareen Piano, RN [facility].

## 2022-12-27 NOTE — Plan of Care (Signed)
  Problem: Education: Goal: Understanding of CV disease, CV risk reduction, and recovery process will improve Outcome: Progressing Goal: Individualized Educational Video(s) Outcome: Progressing   Problem: Activity: Goal: Ability to return to baseline activity level will improve Outcome: Progressing   Problem: Cardiovascular: Goal: Ability to achieve and maintain adequate cardiovascular perfusion will improve Outcome: Progressing Goal: Vascular access site(s) Level 0-1 will be maintained Outcome: Progressing   Problem: Health Behavior/Discharge Planning: Goal: Ability to safely manage health-related needs after discharge will improve Outcome: Progressing   Problem: Education: Goal: Ability to describe self-care measures that may prevent or decrease complications (Diabetes Survival Skills Education) will improve Outcome: Progressing Goal: Individualized Educational Video(s) Outcome: Progressing   Problem: Coping: Goal: Ability to adjust to condition or change in health will improve Outcome: Progressing   Problem: Fluid Volume: Goal: Ability to maintain a balanced intake and output will improve Outcome: Progressing   Problem: Health Behavior/Discharge Planning: Goal: Ability to identify and utilize available resources and services will improve Outcome: Progressing Goal: Ability to manage health-related needs will improve Outcome: Progressing   Problem: Metabolic: Goal: Ability to maintain appropriate glucose levels will improve Outcome: Progressing   Problem: Nutritional: Goal: Progress toward achieving an optimal weight will improve Outcome: Progressing   Problem: Skin Integrity: Goal: Risk for impaired skin integrity will decrease Outcome: Progressing   Problem: Tissue Perfusion: Goal: Adequacy of tissue perfusion will improve Outcome: Progressing   Problem: Education: Goal: Knowledge of General Education information will improve Description: Including pain  rating scale, medication(s)/side effects and non-pharmacologic comfort measures Outcome: Progressing   Problem: Health Behavior/Discharge Planning: Goal: Ability to manage health-related needs will improve Outcome: Progressing   Problem: Clinical Measurements: Goal: Ability to maintain clinical measurements within normal limits will improve Outcome: Progressing Goal: Will remain free from infection Outcome: Progressing Goal: Diagnostic test results will improve Outcome: Progressing Goal: Respiratory complications will improve Outcome: Progressing Goal: Cardiovascular complication will be avoided Outcome: Progressing   Problem: Activity: Goal: Risk for activity intolerance will decrease Outcome: Progressing   Problem: Nutrition: Goal: Adequate nutrition will be maintained Outcome: Progressing   Problem: Coping: Goal: Level of anxiety will decrease Outcome: Progressing   Problem: Elimination: Goal: Will not experience complications related to bowel motility Outcome: Progressing   Problem: Pain Managment: Goal: General experience of comfort will improve Outcome: Progressing   Problem: Safety: Goal: Ability to remain free from injury will improve Outcome: Progressing   Problem: Skin Integrity: Goal: Risk for impaired skin integrity will decrease Outcome: Progressing

## 2022-12-27 NOTE — Progress Notes (Signed)
PT Cancellation Note  Patient Details Name: LOMIE DEUTCH MRN: [id number] DOB: 19-Jul-1965   Cancelled Treatment:    Reason Eval/Treat Not Completed: Patient at procedure or test/unavailable  Currently in HD;  Will follow up later today as time allows;  Otherwise, will follow up for PT tomorrow;   Thank you,  Van Clines, PT  Acute Rehabilitation Services Office [phone]    Levi Aland 12/27/2022, 5:40 PM

## 2022-12-28 ENCOUNTER — Inpatient Hospital Stay (HOSPITAL_COMMUNITY): Payer: 59

## 2022-12-28 ENCOUNTER — Telehealth: Payer: Self-pay

## 2022-12-28 DIAGNOSIS — N186 End stage renal disease: Secondary | ICD-10-CM

## 2022-12-28 DIAGNOSIS — T82898A Other specified complication of vascular prosthetic devices, implants and grafts, initial encounter: Secondary | ICD-10-CM | POA: Diagnosis not present

## 2022-12-28 DIAGNOSIS — N185 Chronic kidney disease, stage 5: Secondary | ICD-10-CM | POA: Diagnosis not present

## 2022-12-28 DIAGNOSIS — D649 Anemia, unspecified: Secondary | ICD-10-CM | POA: Diagnosis not present

## 2022-12-28 DIAGNOSIS — I5022 Chronic systolic (congestive) heart failure: Secondary | ICD-10-CM | POA: Diagnosis not present

## 2022-12-28 LAB — RENAL FUNCTION PANEL
Albumin: 1.8 g/dL — ABNORMAL LOW (ref 3.5–5.0)
Anion gap: 10 (ref 5–15)
BUN: 17 mg/dL (ref 6–20)
CO2: 27 mmol/L (ref 22–32)
Calcium: 8.1 mg/dL — ABNORMAL LOW (ref 8.9–10.3)
Chloride: 100 mmol/L (ref 98–111)
Creatinine, Ser: 3.48 mg/dL — ABNORMAL HIGH (ref 0.44–1.00)
GFR, Estimated: 15 mL/min — ABNORMAL LOW (ref >60)
Glucose, Bld: 169 mg/dL — ABNORMAL HIGH (ref 70–99)
Phosphorus: 3.2 mg/dL (ref 2.5–4.6)
Potassium: 3.1 mmol/L — ABNORMAL LOW (ref 3.5–5.1)
Sodium: 137 mmol/L (ref 135–145)

## 2022-12-28 LAB — GLUCOSE, CAPILLARY
Glucose-Capillary: 106 mg/dL — ABNORMAL HIGH (ref 70–99)
Glucose-Capillary: 154 mg/dL — ABNORMAL HIGH (ref 70–99)
Glucose-Capillary: 292 mg/dL — ABNORMAL HIGH (ref 70–99)
Glucose-Capillary: 135 mg/dL — ABNORMAL HIGH (ref 70–99)

## 2022-12-28 MED ORDER — MUPIROCIN 2 % EX OINT
1.0000 | TOPICAL_OINTMENT | Freq: Two times a day (BID) | CUTANEOUS | Status: DC
  Administered 2022-12-28 – 2023-01-02 (×9): 1 via NASAL
  Filled 2022-12-28 (×3): qty 22

## 2022-12-28 NOTE — H&P (View-Only) (Signed)
Patient ID: Deborah [name], female   DOB: Apr 15, 1965, 57 y.o.   MRN: [id number] Discussed with patient that we are scheduled for a below-knee amputation on the right tomorrow, Wednesday.  I have spoke with her niece about this and patient states that they will call my office when her mother arrives at the hospital so I can further discuss this with her family.

## 2022-12-28 NOTE — Progress Notes (Signed)
Subjective: Seen in room working with physical therapy, said tolerated dialysis yesterday "but had small panic attack medication helped"(as needed Haldol)  Objective Vital signs in last 24 hours: Vitals:   12/28/22 0010 12/28/22 0408 12/28/22 0432 12/28/22 0813  BP: (!) 121/56 (!) 111/50  (!) 110/51  Pulse: (!) 104 (!) 101  (!) 104  Resp: 17 18  19   Temp: 97.8 F (36.6 C) (!) 97.4 F (36.3 C)  97.8 F (36.6 C)  TempSrc:  Oral  Oral  SpO2: 100% 100%  100%  Weight:   123.8 kg   Height:       Weight change:   Physical Exam: General: Chronically ill obese female NAD Heart: RRR no MRG Lungs: CTA bilaterally nonlabored breathing Abdomen: Obese NABS soft NTND Extremities: Bilateral heels with dressings dry clean, foul odor, 1+ bipedal edema Dialysis Access: Right IJ TDC/LUA AVF  + bruit appears deep   Dialysis Orders: MWF [facility]   F180   400/800   3K/2.5 bath  126.9kg   [facility]  /left upper arm AV fistula placed April 2024 - mircera 225 mcg last 10/5 - hectorol 7 mcg   Problem/Plan: ESRD: Back to usual MWF schedule - Hypokalemia= 2.7 yesterday refuse p.o. supplement given IV KCl and use 4K bath.  Lab results this a.m. pending Chronic Wounds - on R heel and LE.  Per admit team, Dr. Lajoyce Corners to evaluate, wound care following.  AMS (improving): MRI head completed with no acute changes.  Could be related to infection, workup per PMD/baseline =very anxious required as needed Haldol yesterday on hd  BP/volume: BP stable, edema on exam. Continue home meds, UF as tolerated. Below prior EDW - lower on discharge. Acute on Chronic Anemia of ESRD: Hgb 7.6. S/p EGD Multiple gastric AVMs, treated with APC and capsule endo 10/17 w/ no acute bleeding but polyps.  Rec colonoscopy. Eliquis on hold. 2U PRBCs given this admit. Aranesp given 10/19, cont qwk. Abdominal pain: CT scan without acute concern. GI resumed Reglan. Due to #5? Secondary Hyperparathyroidism/Hyperphosphatemia: CorrCa/Phos good,  continue meds. Vascular access - TDC in place. Not working well last week HD requiring cath flow dwell. If continues to be an issue may need to have it exchanged./LUA AV fistula inserted 06/2022 appears very deep,may want to have VVS evaluate while here Nutrition: Phosphorus 3.3 follow-up Alb low as outpatient, K 3.6,  changed diet to carb modified, follow-up labs  Lenny Pastel, PA-C [facility] Beeper [phone] 12/28/2022,9:49 AM  LOS: 14 days   Labs: Basic Metabolic Panel: Recent Labs  Lab 12/24/22 0712 12/25/22 0227 12/27/22 0921  NA 134* 135 136  K 3.2* 3.6 2.7*  CL 97* 101 105  CO2 23 21* 23  GLUCOSE 107* 88 115*  BUN 18 18 27*  CREATININE 4.19* 4.09* 4.70*  CALCIUM 8.6* 8.0* 7.9*  PHOS 3.3  --   --    Liver Function Tests: Recent Labs  Lab 12/22/22 0701  AST 8*  ALT 7  ALKPHOS 79  BILITOT 0.9  PROT 5.9*  ALBUMIN 1.8*   No results for input(s): "LIPASE", "AMYLASE" in the last 168 hours. No results for input(s): "AMMONIA" in the last 168 hours. CBC: Recent Labs  Lab 12/22/22 0701 12/24/22 0712 12/25/22 0227 12/27/22 0921  WBC 25.7* 22.0* 20.2* 22.8*  HGB 7.3* 7.6* 7.0* 7.6*  HCT 24.1* 25.4* 23.7* 25.5*  MCV 93.1 95.8 96.0 96.2  PLT 430* 345 243 342   Cardiac Enzymes: No results for input(s): "CKTOTAL", "CKMB", "  CKMBINDEX", "TROPONINI" in the last 168 hours. CBG: Recent Labs  Lab 12/26/22 1946 12/27/22 0930 12/27/22 1147 12/27/22 2254 12/28/22 0809  GLUCAP 213* 111* 119* 211* 106*    Studies/Results: No results found. Medications:   (feeding supplement) PROSource Plus  30 mL Oral BID BM   acidophilus  2 capsule Oral Daily   allopurinol  100 mg Oral BID   atorvastatin  10 mg Oral QHS   busPIRone  10 mg Oral BID   darbepoetin (ARANESP) injection - DIALYSIS  200 mcg Subcutaneous Q Fri-1800   doxercalciferol  7 mcg Intravenous Q M,W,F-HD   DULoxetine  40 mg Oral Daily   feeding supplement (NEPRO CARB STEADY)  237 mL Oral BID  BM   ferric citrate  420 mg Oral TID WC   folic acid  1 mg Oral Daily   Gerhardt's butt cream   Topical BID   insulin aspart  0-6 Units Subcutaneous TID WC   insulin aspart  2 Units Subcutaneous TID WC   insulin glargine-yfgn  8 Units Subcutaneous QHS   leptospermum manuka honey  1 Application Topical Daily   midodrine  5 mg Oral TID WC   mirtazapine  7.5 mg Oral QHS   pantoprazole  40 mg Oral BID AC   sucralfate  1 g Oral TID WC & HS

## 2022-12-28 NOTE — Progress Notes (Signed)
Patient ID: Deborah [name], female   DOB: Apr 15, 1965, 57 y.o.   MRN: [id number] Discussed with patient that we are scheduled for a below-knee amputation on the right tomorrow, Wednesday.  I have spoke with her niece about this and patient states that they will call my office when her mother arrives at the hospital so I can further discuss this with her family.

## 2022-12-28 NOTE — Telephone Encounter (Signed)
Patient called and wanted to make [place] aware that she is in the hospital and has been for the past 3 weeks which is why she hasn't been to her appointments.

## 2022-12-28 NOTE — Progress Notes (Signed)
PROGRESS NOTE  Deborah [name] NGE:[id number] DOB: Dec 09, 1965 DOA: 12/14/2022 PCP: Pcp, No   LOS: 14 days   Brief Narrative / Interim history: 57 y.o. female with hx of ESRD on M/W/F HD, PAF on Eliquis, HFpEF, HTN, DM-2, malignant carcinoid, NAFLD, morbid obesity, chronic ulceration R ankle, recent admission 9/16 -24 initially suspected sepsis from SSTI but ultimately sepsis ruled out, and antibiotics were stopped. That admission underwent angiogram which showed adequate perfusion of the R foot. Treated for gout involving R ankle. Returns due to initial complaint of abd pain, per ED note 1 day history persistent abd pain.  Found to have anemia, with Hb of 5.8. On further history, per RN report patients mother provided collateral of 1 week of bright red blood per rectum.  Hospital stay complicated by repeat GI bleed, needing EGD/capsule endoscopy.  Also has osteo and need for surgery on Wednesday.    Subjective / 24h Interval events: Waiting to hear from Dr. Lajoyce Corners about amputation    Assesement and Plan:  Acute on chronic anemia, concern for acute blood loss anemia due to lower GI bleed  -gastroenterology consulted- saw her without overt bleeding recommended monitoring.  Hemoglobin has remained stable but upon resumption of her Eliquis hemoglobin dropped again to 6.9 -She was transfused a second unit of packed red blood cells on 10/14.Gastroenterology was consulted and she is s/p MWN:[id number] reflux esophagitis in the lower esophagus, normal-appearing duodenum. Had 3 small AVMs 1 with adherent clot that was washed off and revealed active bleeding   high-dose PPI to try to prophylax, but some degree of bleeding could be inevitable if she has to restart anticoagulation. Not a candidate for Carafate long-term given her ESRD. Can give for the next 1-2 weeks, then needs to stop.  -had capsule endoscopy: - Recommend colonoscopy with ileoscopy.  If hemodynamically stable, no active bleeding, can be  pursued as an outpatient - Daily CBC checks with blood products as needed per protocol - Continue high-dose PPI -once procedures are over, can consider restarting eliquis  ESRD -nephrology consulted, appreciate input.   - HD 10/21  A-fib-given new drop in hemoglobin with resumption of her Eliquis, hold Eliquis indefinitely for now (at least until after surgery)  Chronic right heel wound, concern for acute osteomyelitis-last month she underwent an MRI that did not show any osteomyelitis.  Given persistent leukocytosis I have repeated the MRI on 10/14 which does show now early osteomyelitis.  Dr. Lajoyce Corners consulted, appreciate input. -Nontoxic-appearing, hold antibiotics.   -surgery planned for 10/23  Acute toxic encephalopathy, possibly due to opiate medication -monitor,  home Dilaudid dose.  MRI negative.    Leukocytosis-recently on steroids for gout.  She is afebrile, but MRI does show concerns for osteomyelitis to the right heel  Metastatic duodenal carcinoid-had resection in the past  Chronic diastolic CHF-euvolemic, volume management per HD  Chronic pain- home Dilaudid, IV PRN dilaudid for SEVERE pain  Hyperlipidemia, NAFLD-continue statin  Mood disorder-continue duloxetine, added diazepam  Gout-continue home allopurinol  Chronic wounds-wound care consult.  Right foot MRI done just 2 weeks ago without evidence of osteomyelitis  Chronic back pain, spinal stenosis, lumbar region with neurogenic claudication -now wheelchair-bound for several months, underwent an MRI of the lumbar spine earlier this year. It showed lower lumbar spondylosis worsening since 2019, worst at L4-5, with facet arthropathy with joint effusion and periarticular edema and unchanged severe right neuroforaminal narrowing at L5-S1.  Neurosurgery evaluated patient March 2024 and did not recommend surgical approach at this  point but continued PT, outpatient follow-up -PT recommends SNF, family in  agreement  Hypokalemia -repleted   Scheduled Meds:  (feeding supplement) PROSource Plus  30 mL Oral BID BM   acidophilus  2 capsule Oral Daily   allopurinol  100 mg Oral BID   atorvastatin  10 mg Oral QHS   busPIRone  10 mg Oral BID   darbepoetin (ARANESP) injection - DIALYSIS  200 mcg Subcutaneous Q Fri-1800   doxercalciferol  7 mcg Intravenous Q M,W,F-HD   DULoxetine  40 mg Oral Daily   feeding supplement (NEPRO CARB STEADY)  237 mL Oral BID BM   ferric citrate  420 mg Oral TID WC   folic acid  1 mg Oral Daily   Gerhardt's butt cream   Topical BID   insulin aspart  0-6 Units Subcutaneous TID WC   insulin aspart  2 Units Subcutaneous TID WC   insulin glargine-yfgn  8 Units Subcutaneous QHS   leptospermum manuka honey  1 Application Topical Daily   midodrine  5 mg Oral TID WC   mirtazapine  7.5 mg Oral QHS   pantoprazole  40 mg Oral BID AC   sucralfate  1 g Oral TID WC & HS   Continuous Infusions:      PRN Meds:.acetaminophen, albuterol, haloperidol lactate, HYDROmorphone (DILAUDID) injection, HYDROmorphone, loperamide, ondansetron (ZOFRAN) IV  Current Outpatient Medications  Medication Instructions   albuterol (PROVENTIL) 2.5 mg, Nebulization, Every 6 hours PRN   allopurinol (ZYLOPRIM) 100 mg, Oral, 2 times daily   apixaban (ELIQUIS) 5 mg, Oral, 2 times daily   atorvastatin (LIPITOR) 10 mg, Oral, Daily   busPIRone (BUSPAR) 5 mg, Oral, 2 times daily   carvedilol (COREG) 12.5 mg, Oral, 2 times daily   cetirizine (ZYRTEC) 10 mg, Oral, Daily   colchicine 0.3 mg, Oral, 2 times weekly   doxercalciferol (HECTOROL) 4 mcg, Intravenous, Every M-W-F (Hemodialysis)   EASY COMFORT PEN NEEDLES 31G X 5 MM MISC USE 3 TIMES A DAY FOR INSULIN ADMINISTRATION   famotidine (PEPCID) 20 mg, Oral, Daily   ferric citrate (AURYXIA) 420 mg, Oral, 3 times daily with meals   fluticasone (FLONASE) 50 MCG/ACT nasal spray 2 sprays, Each Nare, Daily PRN   gabapentin (NEURONTIN) 100 mg, Oral, 3  times daily   hydrALAZINE (APRESOLINE) 50 mg, Oral, 3 times daily   HYDROmorphone (DILAUDID) 4 mg, Oral, 3 times daily PRN, Three times a day as needed 20 days out of the month.   insulin lispro (HUMALOG) 100 UNIT/ML KwikPen Before each meal 3 times a day, 140-199 - 2 units, 200-250 - 6 units, 251-299 - 8 units,  300-349 - 12 units,  350 or above 14 units.   isosorbide mononitrate (IMDUR) 60 mg, Oral, Daily   midodrine (PROAMATINE) 5 mg, Oral, 3 times daily with meals   mirtazapine (REMERON) 15 mg, Oral, Daily at bedtime   pantoprazole (PROTONIX) 40 mg, Oral, 2 times daily   spironolactone (ALDACTONE) 12.5 mg, Oral, Daily   torsemide (DEMADEX) 50 mg, Oral, 2 times daily    Diet Orders (From admission, onward)     Start     Ordered   12/25/22 1136  Diet Carb Modified Fluid consistency: Thin; Room service appropriate? Yes  Diet effective now       Question Answer Comment  Diet-HS Snack? Nothing   Calorie Level Medium 1600-2000   Fluid consistency: Thin   Room service appropriate? Yes      12/25/22 1135  DVT prophylaxis: SCDs Start: 12/14/22 2037   Lab Results  Component Value Date   PLT 342 12/27/2022      Code Status: Full Code    Status is: Inpatient Remains inpatient appropriate because: osteomyelitis- surgery 10/23- eventual SNF  Level of care: Telemetry Medical  Consultants:  Nephrology GI ortho  Objective: Vitals:   12/28/22 0010 12/28/22 0408 12/28/22 0432 12/28/22 0813  BP: (!) 121/56 (!) 111/50  (!) 110/51  Pulse: (!) 104 (!) 101  (!) 104  Resp: 17 18  19   Temp: 97.8 F (36.6 C) (!) 97.4 F (36.3 C)  97.8 F (36.6 C)  TempSrc:  Oral  Oral  SpO2: 100% 100%  100%  Weight:   123.8 kg   Height:        Intake/Output Summary (Last 24 hours) at 12/28/2022 1313 Last data filed at 12/27/2022 2030 Gross per 24 hour  Intake 240 ml  Output 400 ml  Net -160 ml   Wt Readings from Last 3 Encounters:  12/28/22 123.8 kg  11/28/22 127.5 kg   10/19/22 132.9 kg    Examination:     General: Appearance:    Severely obese female in no acute distress     Lungs:      respirations unlabored  Heart:    Tachycardic. irreg   MS:   All extremities are intact.   Neurologic:   Awake, alert     Data Reviewed: I have independently reviewed following labs and imaging studies   CBC Recent Labs  Lab 12/22/22 0701 12/24/22 0712 12/25/22 0227 12/27/22 0921  WBC 25.7* 22.0* 20.2* 22.8*  HGB 7.3* 7.6* 7.0* 7.6*  HCT 24.1* 25.4* 23.7* 25.5*  PLT 430* 345 243 342  MCV 93.1 95.8 96.0 96.2  MCH 28.2 28.7 28.3 28.7  MCHC 30.3 29.9* 29.5* 29.8*  RDW 17.2* 16.9* 17.0* 16.5*    Recent Labs  Lab 12/21/22 1342 12/22/22 0701 12/24/22 0712 12/25/22 0227 12/27/22 0921  NA  --  135 134* 135 136  K  --  3.3* 3.2* 3.6 2.7*  CL  --  98 97* 101 105  CO2  --  26 23 21* 23  GLUCOSE  --  100* 107* 88 115*  BUN  --  20 18 18  27*  CREATININE  --  3.93* 4.19* 4.09* 4.70*  CALCIUM  --  8.2* 8.6* 8.0* 7.9*  AST  --  8*  --   --   --   ALT  --  7  --   --   --   ALKPHOS  --  79  --   --   --   BILITOT  --  0.9  --   --   --   ALBUMIN  --  1.8*  --   --   --   MG  --  1.8  --   --   --   CRP 14.8*  --   --   --   --     ------------------------------------------------------------------------------------------------------------------ No results for input(s): "CHOL", "HDL", "LDLCALC", "TRIG", "CHOLHDL", "LDLDIRECT" in the last 72 hours.  Lab Results  Component Value Date   HGBA1C 8.1 (H) 06/19/2022   ------------------------------------------------------------------------------------------------------------------ No results for input(s): "TSH", "T4TOTAL", "T3FREE", "THYROIDAB" in the last 72 hours.  Invalid input(s): "FREET3"  Cardiac Enzymes No results for input(s): "CKMB", "TROPONINI", "MYOGLOBIN" in the last 168 hours.  Invalid input(s):  "CK" ------------------------------------------------------------------------------------------------------------------    Component Value Date/Time   BNP 368.9 (H) 12/14/2022 1700  CBG: Recent Labs  Lab 12/26/22 1946 12/27/22 0930 12/27/22 1147 12/27/22 2254 12/28/22 0809  GLUCAP 213* 111* 119* 211* 106*    No results found for this or any previous visit (from the past 240 hour(s)).   Radiology Studies: No results found.   Marlin Canary DO [facility]  Between 7 am - 7 pm I am available, please contact me via Amion (for emergencies) or Securechat (non urgent messages)  Between 7 pm - 7 am I am not available, please contact night coverage MD/APP via Amion

## 2022-12-28 NOTE — Consult Note (Addendum)
Hospital Consult    Reason for Consult:  evaluation of left arm fistula Requesting Physician:  Lenny Pastel, PA MRN #:  [id number]  History of Present Illness: Deborah [name] is a 57 y.o. female with a PMH of chronic systolic CHF, DMII, fibromyalgia, HTN, paroxysmal A-fib, morbid obesity, and ESRD on HD M/W/F who was admitted 2 weeks ago with anemia and abdominal pain.  She had a GI bleed requiring EGD.  During her admission she was also found to have right foot osteomyelitis.  We were consulted for evaluation of her left brachiocephalic fistula, which was created by Dr. Chestine Spore on 06/30/2022.  After creation it was noted she would likely need superficialization, however she was lost to follow-up.  On my exam today she is doing well.  She is currently dialyzing via right IJ TDC.  She denies any left hand pain, weakness, or excessive coldness.  She does have some numbness of her fingertips bilaterally.  Past Medical History:  Diagnosis Date   Acute back pain with sciatica, left    Acute back pain with sciatica, right    AKI (acute kidney injury) (HCC)    Anemia, unspecified    Cancer (HCC)    Carcinoid tumor of duodenum    Chest pain with normal coronary angiography 2019   Chronic kidney disease, stage 3b (HCC)    Chronic pain    Chronic systolic CHF (congestive heart failure) (HCC)    Diabetes mellitus    DKA (diabetic ketoacidosis) (HCC)    Drug-seeking behavior    21 hospitalizations and 14 CT a/p in 2 years for N/V and abdominal pain, demanding only IV dilaudid   Elevated troponin    chronic   Esophageal reflux    Fibromyalgia    Gastric ulcer    Gastroparesis    Gout    Hyperlipidemia    Hypertension    Hypokalemia    Hypomagnesemia    Lumbosacral stenosis    LVH (left ventricular hypertrophy)    Morbid obesity (HCC)    NICM (nonischemic cardiomyopathy) (HCC)    PAF (paroxysmal atrial fibrillation) (HCC)    Stroke (HCC) 02/2011   Thrombocytosis    Vitamin B12  deficiency anemia     Past Surgical History:  Procedure Laterality Date   ABDOMINAL AORTOGRAM W/LOWER EXTREMITY N/A 11/29/2022   Procedure: ABDOMINAL AORTOGRAM W/LOWER EXTREMITY;  Surgeon: Daria Pastures, MD;  Location: [facility];  Service: Cardiovascular;  Laterality: N/A;   AV FISTULA PLACEMENT Left 06/30/2022   Procedure: LEFT BRACHIOCEPHALIC ARTERIOVENOUS (AV) FISTULA CREATION;  Surgeon: Cephus Shelling, MD;  Location: [facility] OR;  Service: Vascular;  Laterality: Left;   BIOPSY  07/27/2019   Procedure: BIOPSY;  Surgeon: Vida Rigger, MD;  Location: [facility];  Service: Endoscopy;;   BIOPSY  07/30/2019   Procedure: BIOPSY;  Surgeon: Kathi Der, MD;  Location: [facility];  Service: Gastroenterology;;   CATARACT EXTRACTION  01/2014   CHOLECYSTECTOMY     COLONOSCOPY WITH PROPOFOL N/A 07/30/2019   Procedure: COLONOSCOPY WITH PROPOFOL;  Surgeon: Kathi Der, MD;  Location: [facility];  Service: Gastroenterology;  Laterality: N/A;   ESOPHAGOGASTRODUODENOSCOPY N/A 07/27/2019   Procedure: ESOPHAGOGASTRODUODENOSCOPY (EGD);  Surgeon: Vida Rigger, MD;  Location: [facility];  Service: Endoscopy;  Laterality: N/A;   ESOPHAGOGASTRODUODENOSCOPY N/A 07/26/2020   Procedure: ESOPHAGOGASTRODUODENOSCOPY (EGD);  Surgeon: Willis Modena, MD;  Location: [facility];  Service: Endoscopy;  Laterality: N/A;   ESOPHAGOGASTRODUODENOSCOPY (EGD) WITH PROPOFOL N/A 08/02/2019   Procedure: ESOPHAGOGASTRODUODENOSCOPY (EGD) WITH PROPOFOL;  Surgeon: Kathi Der, MD;  Location: [facility];  Service: Gastroenterology;  Laterality: N/A;   ESOPHAGOGASTRODUODENOSCOPY (EGD) WITH PROPOFOL N/A 12/23/2022   Procedure: ESOPHAGOGASTRODUODENOSCOPY (EGD) WITH PROPOFOL;  Surgeon: Shellia Cleverly, DO;  Location: [facility];  Service: Gastroenterology;  Laterality: N/A;   GIVENS CAPSULE STUDY N/A 12/23/2022   Procedure: GIVENS CAPSULE STUDY;  Surgeon: Shellia Cleverly, DO;  Location: [facility];   Service: Gastroenterology;  Laterality: N/A;   HEMOSTASIS CLIP PLACEMENT  08/02/2019   Procedure: HEMOSTASIS CLIP PLACEMENT;  Surgeon: Kathi Der, MD;  Location: [facility];  Service: Gastroenterology;;   HOT HEMOSTASIS N/A 12/23/2022   Procedure: HOT HEMOSTASIS (ARGON PLASMA COAGULATION/BICAP);  Surgeon: Shellia Cleverly, DO;  Location[facility] ENDOSCOPY;  Service: Gastroenterology;  Laterality: N/A;   IR FLUORO GUIDE CV LINE RIGHT  06/24/2022   IR US GUIDE VASC ACCESS RIGHT  06/24/2022   POLYPECTOMY  07/30/2019   Procedure: POLYPECTOMY;  Surgeon: Kathi Der, MD;  Location: [facility];  Service: Gastroenterology;;   POLYPECTOMY  08/02/2019   Procedure: POLYPECTOMY;  Surgeon: Kathi Der, MD;  Location: [facility];  Service: Gastroenterology;;    Allergies  Allergen Reactions   Isovue [Iopamidol] Anaphylaxis, [name] and Other (See Comments)    11/28/17 Patient had seizure like activity and then 1 min code after 100 cc of isovue 300. Possible contrast allergy vs vasovagal episode  Cardiac Arrest   Neurontin [Gabapentin] [name] and Swelling   Nsaids Anaphylaxis and Other (See Comments)    Hx of stomach ulcers   Penicillins Itching, Palpitations and Other (See Comments)    Flushing (Red Skin) Laryngeal Edema   Reglan [Metoclopramide] Other (See Comments)    Tardive dyskinesia    Valium [Diazepam] [name]   Zestril [Lisinopril] Anaphylaxis and Swelling    Tongue and mouth swelling Laryngeal Edema   Tolectin [Tolmetin] Nausea And Vomiting, Nausea Only and Other (See Comments)    Irritates stomach ulcer   Asa [Aspirin] Other (See Comments)    Hx of stomach ulcer   Aspartame And Phenylalanine Hives   Bentyl [Dicyclomine] Other (See Comments)    Chest pain   Hibiclens [Chlorhexidine Gluconate] Other (See Comments)    Dermatitis    Flexeril [Cyclobenzaprine] Palpitations   Oxycontin [Oxycodone] Palpitations   Rifamycins  Palpitations   Tylenol [Acetaminophen] Nausea And Vomiting, Nausea Only and Other (See Comments)    Irritates stomach ulcer Abdominal pain   Ultram [Tramadol] Nausea And Vomiting and Palpitations    Prior to Admission medications   Medication Sig Start Date End Date Taking? Authorizing Provider  albuterol (PROVENTIL) (2.5 MG/3ML) 0.083% nebulizer solution Take 3 mLs (2.5 mg total) by nebulization every 6 (six) hours as needed for wheezing or shortness of breath. 04/06/19   Bing Neighbors, NP  allopurinol (ZYLOPRIM) 100 MG tablet Take 1 tablet (100 mg total) by mouth 2 (two) times daily. 11/30/22   Azucena Fallen, MD  apixaban (ELIQUIS) 5 MG TABS tablet Take 5 mg by mouth 2 (two) times daily. 12/03/21   [provider]  atorvastatin (LIPITOR) 10 MG tablet Take 10 mg by mouth daily. 12/03/21   [provider]  busPIRone (BUSPAR) 5 MG tablet Take 5 mg by mouth 2 (two) times daily.    [provider]  carvedilol (COREG) 12.5 MG tablet Take 12.5 mg by mouth 2 (two) times daily.    [provider]  cetirizine (ZYRTEC) 10 MG tablet Take 10 mg by mouth daily. 08/05/22  [provider]  colchicine 0.6 MG tablet Take 0.5 tablets (0.3 mg total) by mouth 2 (two) times a week. 12/02/22   Azucena Fallen, MD  doxercalciferol (HECTOROL) 4 MCG/2ML injection Inject 2 mLs (4 mcg total) into the vein every Monday, Wednesday, and Friday with hemodialysis. 11/30/22   Azucena Fallen, MD  EASY COMFORT PEN NEEDLES 31G X 5 MM MISC USE 3 TIMES A DAY FOR INSULIN ADMINISTRATION 11/14/19   Meccariello, Solmon Ice, MD  famotidine (PEPCID) 20 MG tablet Take 20 mg by mouth daily.    [provider]  ferric citrate (AURYXIA) 1 GM 210 MG(Fe) tablet Take 2 tablets (420 mg total) by mouth 3 (three) times daily with meals. Patient taking differently: Take 210 mg by mouth 3 (three) times daily with meals. 11/30/22   Azucena Fallen, MD  fluticasone [facility]) 50  MCG/ACT nasal spray Place 2 sprays into both nostrils daily as needed for allergies or rhinitis. 12/19/18   Rai, Delene Ruffini, MD  gabapentin (NEURONTIN) 100 MG capsule Take 1 capsule (100 mg total) by mouth 3 (three) times daily. 11/30/22   Azucena Fallen, MD  hydrALAZINE (APRESOLINE) 50 MG tablet Take 50 mg by mouth 3 (three) times daily.    [provider]  HYDROmorphone (DILAUDID) 4 MG tablet Take 1 tablet (4 mg total) by mouth 3 (three) times daily as needed for moderate pain. Three times a day as needed 20 days out of the month. 11/18/22   Jones Bales, NP  insulin lispro (HUMALOG) 100 UNIT/ML KwikPen Before each meal 3 times a day, 140-199 - 2 units, 200-250 - 6 units, 251-299 - 8 units,  300-349 - 12 units,  350 or above 14 units. Patient taking differently: Inject 2-14 Units into the skin See admin instructions. Before each meal and at bedtime. 140-199 - 2 units, 200-250 - 6 units, 251-299 - 8 units,  300-349 - 12 units,  350 or above 14 units. 06/04/22   Leroy Sea, MD  isosorbide mononitrate (IMDUR) 60 MG 24 hr tablet Take 60 mg by mouth daily.    [provider]  midodrine (PROAMATINE) 5 MG tablet Take 1 tablet (5 mg total) by mouth 3 (three) times daily with meals. 11/30/22   Azucena Fallen, MD  mirtazapine (REMERON) 15 MG tablet Take 15 mg by mouth at bedtime. 10/18/22   [provider]  pantoprazole (PROTONIX) 40 MG tablet Take 40 mg by mouth 2 (two) times daily.    [provider]  spironolactone (ALDACTONE) 25 MG tablet Take 12.5 mg by mouth daily.    [provider]  torsemide (DEMADEX) 100 MG tablet Take 50 mg by mouth 2 (two) times daily.    [provider]    Social History   Socioeconomic History   Marital status: Married    Spouse name: Not on file   Number of children: Not on file   Years of education: Not on file   Highest education level: Not on file  Occupational History   Not on file  Tobacco Use    Smoking status: Never   Smokeless tobacco: Never  Vaping Use   Vaping status: Never Used  Substance and Sexual Activity   Alcohol use: No   Drug use: No   Sexual activity: Not Currently    Birth control/protection: None  Other Topics Concern   Not on file  Social History Narrative   ** Merged History Encounter **  Social Determinants of Health   Financial Resource Strain: Low Risk (03/23/2021)   Received [facility])   Financial Resource Strain  Food Insecurity: No Food Insecurity (12/15/2022)   Hunger Vital Sign    Worried About Running Out of Food in the Last Year: Never true    Ran Out of Food in the Last Year: Never true  Transportation Needs: No Transportation Needs (12/15/2022)   PRAPARE - Administrator, Civil Service (Medical): No    Lack of Transportation (Non-Medical): No  Physical Activity: Not on File (10/31/2017)   Received from [place], [place]   Physical Activity    Physical Activity: 0  Stress: Low Risk (03/23/2021)   [facility])   Stress    Over the last 2 weeks, how often have you been bothered by the following problems: feeling nervous, anxious, on edge?: Not at all    Over the last 2 weeks, how often have you been bothered by the following problems: Not being able to stop or control worrying?: Not at all  Social Connections: Unknown (07/19/2021)   Received from [facility]   Social Network    Social Network: Not on file  Intimate Partner Violence: Not At Risk (12/15/2022)   Humiliation, Afraid, Rape, and Kick questionnaire    Fear of Current or Ex-Partner: No    Emotionally Abused: No    Physically Abused: No    Sexually Abused: No     Family History  Problem Relation Age of Onset   Diabetes Mother    Diabetes Father    Heart disease Father    Diabetes Sister    Congestive Heart Failure Sister 56   Diabetes Brother     ROS:  Otherwise negative unless mentioned in HPI  Physical Examination  Vitals:   12/28/22 0408 12/28/22 0813  BP: (!) 111/50 (!) 110/51  Pulse: (!) 101 (!) 104  Resp: 18 19  Temp: (!) 97.4 F (36.3 C) 97.8 F (36.6 C)  SpO2: 100% 100%   Body mass index is 44.05 kg/m.  General:  obese female in NAD Gait: Not observed HENT: WNL, normocephalic Pulmonary: normal non-labored breathing Cardiac: regular Abdomen:  soft, NT/ND, no masses Skin: without rashes Vascular Exam/Pulses: Palpable left radial pulse Extremities: Right lower extremity bandaged and dry.  Left brachiocephalic fistula easily identifiable and AC fossa with strong thrill on palpation.  Fistula is very deep in the upper arm. Musculoskeletal: no muscle wasting or atrophy  Neurologic: A&O X 3;  No focal weakness or paresthesias are detected; speech is fluent/normal Psychiatric:  The pt has Normal affect. Lymph:  Unremarkable  CBC    Component Value Date/Time   WBC 22.8 (H) 12/27/2022 0921   RBC 2.65 (L) 12/27/2022 0921   HGB 7.6 (L) 12/27/2022 0921   HGB 8.8 (L) 10/09/2019 0906   HGB 9.6 (L) 09/03/2019 1620   HCT 25.5 (L) 12/27/2022 0921   HCT 30.4 (L) 09/03/2019 1620   PLT 342 12/27/2022 0921   PLT 348 10/09/2019 0906   PLT 451 (H) 09/03/2019 1620   MCV 96.2 12/27/2022 0921   MCV 85 09/03/2019 1620   MCH 28.7 12/27/2022 0921   MCHC 29.8 (L) 12/27/2022 0921   RDW 16.5 (H) 12/27/2022 0921   RDW 13.6 09/03/2019 1620   LYMPHSABS 1.8 12/20/2022 0259   LYMPHSABS 2.2 09/03/2019 1620   MONOABS 0.9 12/20/2022 0259   EOSABS 0.4 12/20/2022 0259  EOSABS 0.3 09/03/2019 1620   BASOSABS 0.1 12/20/2022 0259   BASOSABS 0.1 09/03/2019 1620    BMET    Component Value Date/Time   NA 136 12/27/2022 0921   NA 137 09/20/2019 1530   K 2.7 (LL) 12/27/2022 0921   CL 105 12/27/2022 0921   CO2 23 12/27/2022 0921   GLUCOSE 115 (H) 12/27/2022 0921   BUN 27 (H) 12/27/2022 0921   BUN 18 09/20/2019 1530   CREATININE 4.70 (H)  12/27/2022 0921   CALCIUM 7.9 (L) 12/27/2022 0921   GFRNONAA 10 (L) 12/27/2022 0921   GFRAA 49 (L) 11/30/2019 0356    COAGS: Lab Results  Component Value Date   INR 1.9 (H) 12/15/2022   INR 1.8 (H) 11/23/2022   INR 1.1 10/08/2020     Non-Invasive Vascular Imaging:   LUE Dialysis Duplex Pending   ASSESSMENT/PLAN: This is a 57 y.o. female with ESRD   -This patient is well-known to our practice with previous interventions for ESRD and PAD.  Most recently she underwent angiogram for a right lower extremity wound.  Her right foot wound has progressed to osteomyelitis, and she will be getting a right BKA by Dr. Lajoyce Corners tomorrow. -She has a history of left brachiocephalic AV fistula creation by Dr. Chestine Spore on 06/30/2022.  It was previously thought she would need a superficialization prior to use, however she was lost to follow-up. -On my exam today her left hand is warm well-perfused with a palpable radial pulse.  Her left brachiocephalic fistula has a great thrill palpation.  The fistula is easily identifiable in the [facility] fossa and then dives deep into the upper arm. -She will need a left brachiocephalic fistula superficialization prior to use.  We will get a duplex of the fistula prior to surgery to ensure it has matured well. -Timing of surgery to be determined by Dr. Randie Heinz.  Surgery will not be tomorrow since she is getting a right BKA.  Potentially surgery later this week. The patient's Eliquis for paroxysmal A-fib is currently being held due to recurrent GI bleed.   Loel Dubonnet PA-C [facility] [phone]   I have independently interviewed and examined patient and agree with PA assessment and plan above.  Plan will be for superficialization as long as duplex appears adequate.  She is to have amputation with Dr. Lajoyce Corners tomorrow and we will tentatively plan for superficialization on Friday of this week and will need to continue to hold her her anticoagulation.  I discussed  the operative details and plan with patient and her family at bedside and they demonstrate good understanding.  Idonna Heeren C. Randie Heinz, MD [facility] [place] Office: [phone] Pager: [phone]

## 2022-12-28 NOTE — Progress Notes (Signed)
Physical Therapy Treatment Patient Details Name: Deborah [name] MRN: [id number] DOB: December 20, 1965 Today's Date: 12/28/2022   History of Present Illness The pt is a 56 yo female presenting 10/8 with weakness, diarrhea x4 days, nausea, vomiting, and AMS over last month. Work up revealed Hgb of 5.8, undergoing continued work up for GIB. PMH includes: prior cancer of duodenum, ESRD, GERD, obesity, diabetes, and chronic sacral wounds.    PT Comments  Pt obviously upset on entry, reports she is upset about need for R BKA tomorrow. Reports she knows Dr Lajoyce Corners has her best interest at heart but can not imagine how she will be able to get around after surgery. Discussed need for strengthening, LE, UE and core muscles. Pt agreeable to sit on EoB but does not want to get up to recliner at this time. Able to sit EoB ~15 min to talk to MD and perform seated exercises. In order to make therapeutic exercise easier, placed pt bed in chair position and had pt perform additional seated exercise. Pt bed control missing so that she can return bed to supine, Geophysical data processor informed to get controller from [employer].     If plan is discharge home, recommend the following: Two people to help with walking and/or transfers;A lot of help with bathing/dressing/bathroom;Assist for transportation;Supervision due to cognitive status;Help with stairs or ramp for entrance;Direct supervision/assist for medications management;Direct supervision/assist for financial management   Can travel by private vehicle     No  Equipment Recommendations  Hoyer lift;Hospital bed;Wheelchair cushion (measurements PT);Wheelchair (measurements PT) (defer to next venue)    Recommendations for Other Services OT consult     Precautions / Restrictions Precautions Precautions: Fall Precaution Comments: sacral wounds, bilateral heel wounds Restrictions Weight Bearing Restrictions: Yes Other Position/Activity Restrictions: from last  admission last month, "Per Dr Lajoyce Corners, ideally will be NWB on rt heel, but put as little weight as possible on rt foot'     Mobility  Bed Mobility Overal bed mobility: Needs Assistance Bed Mobility: Sit to Supine, Supine to Sit     Supine to sit: Supervision Sit to supine: Min assist, Used rails   General bed mobility comments: pt able to get to EoB with increased time and effort, use of bed rails and HoB elevated but no physical assist required, requires min A for managing LE back into bed at end of session. with bed in trendelenburg pt able to pull herself up in bed. Placed bed in chair position at end of session and educated on LE exercises she can do while she is sitting up    Transfers                   General transfer comment: pt refuses       Balance Overall balance assessment: Needs assistance Sitting-balance support: Bilateral upper extremity supported, Feet supported Sitting balance-Leahy Scale: Fair Sitting balance - Comments: able to sit without UE support                                    Cognition Arousal: Alert Behavior During Therapy: Flat affect, Lability Overall Cognitive Status: Impaired/Different from baseline Area of Impairment: Orientation, Attention, Following commands, Safety/judgement, Problem solving, Awareness                 Orientation Level: Disoriented to, Situation, Time Current Attention Level: Focused   Following Commands: Follows one step commands inconsistently Safety/Judgement:  Decreased awareness of safety, Decreased awareness of deficits Awareness: Intellectual Problem Solving: Slow processing, Decreased initiation, Difficulty sequencing, Requires verbal cues, Requires tactile cues General Comments: Pt is overwhelmed by Dr Audrie Lia visit earlier and plan for R BKA,        Exercises General Exercises - Lower Extremity Ankle Circles/Pumps: AROM, Supine, 10 reps Quad Sets: AROM, 10 reps, Seated (bed in  chair position) Gluteal Sets: AROM, 10 reps, Seated Long Arc Quad: AROM, 10 reps, Seated Hip ABduction/ADduction: AROM, 10 reps, Seated Hip Flexion/Marching: AROM, 10 reps, Seated    General Comments General comments (skin integrity, edema, etc.): Pt overwhelmed with news of R BKA tomorrow, listened and provided support, advised maintaining as much strength in LE as possible performing LE in bed, pt husband present throughout session      Pertinent Vitals/Pain Pain Assessment Pain Assessment: Faces Faces Pain Scale: Hurts a little bit Pain Location: generalized and bil LE Pain Descriptors / Indicators: Discomfort Pain Intervention(s): Limited activity within patient's tolerance, Monitored during session, Repositioned     PT Goals (current goals can now be found in the care plan section) Acute Rehab PT Goals Patient Stated Goal: to reduce pain PT Goal Formulation: Patient unable to participate in goal setting Time For Goal Achievement: 01/01/23 Potential to Achieve Goals: Fair    Frequency    Min 1X/week       AM-PAC PT "6 Clicks" Mobility   Outcome Measure  Help needed turning from your back to your side while in a flat bed without using bedrails?: A Lot Help needed moving from lying on your back to sitting on the side of a flat bed without using bedrails?: A Lot Help needed moving to and from a bed to a chair (including a wheelchair)?: Total Help needed standing up from a chair using your arms (e.g., wheelchair or bedside chair)?: Total Help needed to walk in hospital room?: Total Help needed climbing 3-5 steps with a railing? : Total 6 Click Score: 8    End of Session   Activity Tolerance: Patient tolerated treatment well Patient left: in bed;with call bell/phone within reach;with bed alarm set;with family/visitor present Nurse Communication: Mobility status PT Visit Diagnosis: Muscle weakness (generalized) (M62.81);Difficulty in walking, not elsewhere classified  (R26.2)     Time: 1610-9604 PT Time Calculation (min) (ACUTE ONLY): 32 min  Charges:    $Therapeutic Exercise: 8-22 mins $Therapeutic Activity: 8-22 mins PT General Charges $$ ACUTE PT VISIT: 1 Visit                     Rether Rison B. Beverely Risen PT, DPT Acute Rehabilitation Services Please use secure chat or  Call Office [phone]    Deborah [name] 12/28/2022, 11:14 AM

## 2022-12-28 NOTE — Progress Notes (Signed)
   12/28/22 1200  Spiritual Encounters  Type of Visit Initial  Care provided to: Pt and family  Conversation partners present during encounter Nurse  Referral source Patient request;Family  Reason for visit Routine spiritual support  OnCall Visit No   Ch responded to request for emotional and spiritual support. Family was present at bedside. Ammarie has surgery procedure schedule for tomorrow and she want chaplain to offer a word of prayer. Chaplain prayed for healing and a quick recovery. Ch remains available if needed.

## 2022-12-29 ENCOUNTER — Encounter (HOSPITAL_COMMUNITY)
Admission: EM | Disposition: A | Discharge status: Skilled Nursing Facility | Payer: Self-pay | Source: Home / Self Care | Attending: Internal Medicine

## 2022-12-29 ENCOUNTER — Encounter (HOSPITAL_COMMUNITY): Payer: Self-pay | Admitting: Internal Medicine

## 2022-12-29 ENCOUNTER — Inpatient Hospital Stay (HOSPITAL_COMMUNITY): Payer: 59 | Admitting: Registered Nurse

## 2022-12-29 ENCOUNTER — Other Ambulatory Visit: Payer: Self-pay

## 2022-12-29 DIAGNOSIS — M869 Osteomyelitis, unspecified: Secondary | ICD-10-CM | POA: Diagnosis not present

## 2022-12-29 DIAGNOSIS — D649 Anemia, unspecified: Secondary | ICD-10-CM | POA: Diagnosis not present

## 2022-12-29 DIAGNOSIS — E1169 Type 2 diabetes mellitus with other specified complication: Secondary | ICD-10-CM | POA: Diagnosis not present

## 2022-12-29 DIAGNOSIS — M86171 Other acute osteomyelitis, right ankle and foot: Secondary | ICD-10-CM | POA: Diagnosis not present

## 2022-12-29 HISTORY — PX: AMPUTATION: SHX166

## 2022-12-29 LAB — CBC
HCT: 22.2 % — ABNORMAL LOW (ref 36.0–46.0)
Hemoglobin: 6.4 g/dL — CL (ref 12.0–15.0)
MCH: 27.8 pg (ref 26.0–34.0)
MCHC: 28.8 g/dL — ABNORMAL LOW (ref 30.0–36.0)
MCV: 96.5 fL (ref 80.0–100.0)
Platelets: 321 10*3/uL (ref 150–400)
RBC: 2.3 MIL/uL — ABNORMAL LOW (ref 3.87–5.11)
RDW: 16.9 % — ABNORMAL HIGH (ref 11.5–15.5)
WBC: 18.1 10*3/uL — ABNORMAL HIGH (ref 4.0–10.5)
nRBC: 0 % (ref 0.0–0.2)

## 2022-12-29 LAB — GLUCOSE, CAPILLARY
Glucose-Capillary: 81 mg/dL (ref 70–99)
Glucose-Capillary: 84 mg/dL (ref 70–99)
Glucose-Capillary: 105 mg/dL — ABNORMAL HIGH (ref 70–99)
Glucose-Capillary: 89 mg/dL (ref 70–99)
Glucose-Capillary: 70 mg/dL (ref 70–99)

## 2022-12-29 LAB — POCT I-STAT, CHEM 8
BUN: 31 mg/dL — ABNORMAL HIGH (ref 6–20)
Calcium, Ion: 1.11 mmol/L — ABNORMAL LOW (ref 1.15–1.40)
Chloride: 99 mmol/L (ref 98–111)
Creatinine, Ser: 4.3 mg/dL — ABNORMAL HIGH (ref 0.44–1.00)
Glucose, Bld: 79 mg/dL (ref 70–99)
HCT: 26 % — ABNORMAL LOW (ref 36.0–46.0)
Hemoglobin: 8.8 g/dL — ABNORMAL LOW (ref 12.0–15.0)
Potassium: 3.4 mmol/L — ABNORMAL LOW (ref 3.5–5.1)
Sodium: 138 mmol/L (ref 135–145)
TCO2: 28 mmol/L (ref 22–32)

## 2022-12-29 LAB — SURGICAL PCR SCREEN
MRSA, PCR: NEGATIVE
Staphylococcus aureus: POSITIVE — AB

## 2022-12-29 LAB — PREPARE RBC (CROSSMATCH)

## 2022-12-29 SURGERY — AMPUTATION BELOW KNEE
Anesthesia: General | Site: Knee | Laterality: Right

## 2022-12-29 MED ORDER — ONDANSETRON HCL 4 MG/2ML IJ SOLN
INTRAMUSCULAR | Status: AC
  Filled 2022-12-29: qty 2

## 2022-12-29 MED ORDER — FENTANYL CITRATE (PF) 100 MCG/2ML IJ SOLN
INTRAMUSCULAR | Status: AC
  Filled 2022-12-29: qty 2

## 2022-12-29 MED ORDER — MIDAZOLAM HCL 2 MG/2ML IJ SOLN
INTRAMUSCULAR | Status: AC
  Filled 2022-12-29: qty 2

## 2022-12-29 MED ORDER — ROCURONIUM BROMIDE 100 MG/10ML IV SOLN
INTRAVENOUS | Status: DC | PRN
  Administered 2022-12-29: 40 mg via INTRAVENOUS

## 2022-12-29 MED ORDER — GUAIFENESIN-DM 100-10 MG/5ML PO SYRP: 15.0000 mL | ORAL_SOLUTION | ORAL | Status: DC | PRN

## 2022-12-29 MED ORDER — CHLORHEXIDINE GLUCONATE 4 % EX SOLN: 60.0000 mL | Freq: Once | CUTANEOUS | Status: DC

## 2022-12-29 MED ORDER — SODIUM CHLORIDE 0.9 % IV SOLN: INTRAVENOUS | Status: DC

## 2022-12-29 MED ORDER — DOCUSATE SODIUM 100 MG PO CAPS
100.0000 mg | ORAL_CAPSULE | Freq: Every day | ORAL | Status: DC
Start: 2022-12-30 — End: 2023-01-10
  Administered 2022-12-30 – 2023-01-08 (×5): 100 mg via ORAL
  Filled 2022-12-29 (×8): qty 1

## 2022-12-29 MED ORDER — MEDIHONEY WOUND/BURN DRESSING EX PSTE
1.0000 | PASTE | Freq: Every day | CUTANEOUS | Status: DC
  Administered 2022-12-29: 1 via TOPICAL
  Filled 2022-12-29: qty 44

## 2022-12-29 MED ORDER — PHENOL 1.4 % MT LIQD: 1.0000 | OROMUCOSAL | Status: DC | PRN

## 2022-12-29 MED ORDER — CHLORHEXIDINE GLUCONATE 0.12 % MT SOLN: 15.0000 mL | Freq: Once | OROMUCOSAL | Status: AC

## 2022-12-29 MED ORDER — LIDOCAINE 2% (20 MG/ML) 5 ML SYRINGE
INTRAMUSCULAR | Status: DC | PRN
  Administered 2022-12-29: 100 mg via INTRAVENOUS

## 2022-12-29 MED ORDER — HYDROMORPHONE HCL 1 MG/ML IJ SOLN
0.2500 mg | INTRAMUSCULAR | Status: DC | PRN
  Administered 2022-12-29 (×3): 0.5 mg via INTRAVENOUS

## 2022-12-29 MED ORDER — CHLORHEXIDINE GLUCONATE 0.12 % MT SOLN
OROMUCOSAL | Status: AC
  Administered 2022-12-29: 15 mL via OROMUCOSAL
  Filled 2022-12-29: qty 15

## 2022-12-29 MED ORDER — VITAMIN C 500 MG PO TABS
1000.0000 mg | ORAL_TABLET | Freq: Every day | ORAL | Status: DC
  Administered 2022-12-30 – 2023-01-10 (×11): 1000 mg via ORAL
  Filled 2022-12-29 (×11): qty 2

## 2022-12-29 MED ORDER — OXYCODONE HCL 5 MG PO TABS
10.0000 mg | ORAL_TABLET | ORAL | Status: DC | PRN
Start: 2022-12-29 — End: 2022-12-30
  Administered 2022-12-30: 15 mg via ORAL
  Filled 2022-12-29: qty 3

## 2022-12-29 MED ORDER — FENTANYL CITRATE (PF) 100 MCG/2ML IJ SOLN
INTRAMUSCULAR | Status: DC | PRN
Start: 2022-12-29 — End: 2022-12-29
  Administered 2022-12-29 (×2): 50 ug via INTRAVENOUS

## 2022-12-29 MED ORDER — 0.9 % SODIUM CHLORIDE (POUR BTL) OPTIME
TOPICAL | Status: DC | PRN
Start: 2022-12-29 — End: 2022-12-29
  Administered 2022-12-29: 1000 mL

## 2022-12-29 MED ORDER — TRANEXAMIC ACID-NACL 1000-0.7 MG/100ML-% IV SOLN
INTRAVENOUS | Status: AC
  Filled 2022-12-29: qty 100

## 2022-12-29 MED ORDER — SODIUM CHLORIDE 0.9 % IV SOLN: INTRAVENOUS | Status: DC | PRN

## 2022-12-29 MED ORDER — SUGAMMADEX SODIUM 200 MG/2ML IV SOLN
INTRAVENOUS | Status: DC | PRN
  Administered 2022-12-29: 200 mg via INTRAVENOUS

## 2022-12-29 MED ORDER — KETAMINE HCL 10 MG/ML IJ SOLN
INTRAMUSCULAR | Status: DC | PRN
  Administered 2022-12-29: 50 mg via INTRAVENOUS

## 2022-12-29 MED ORDER — POLYETHYLENE GLYCOL 3350 17 G PO PACK: 17.0000 g | PACK | Freq: Every day | ORAL | Status: DC | PRN

## 2022-12-29 MED ORDER — HYDROMORPHONE HCL 1 MG/ML IJ SOLN
0.5000 mg | INTRAMUSCULAR | Status: DC | PRN
  Administered 2022-12-29: 1 mg via INTRAVENOUS
  Filled 2022-12-29 (×2): qty 1

## 2022-12-29 MED ORDER — ALUM & MAG HYDROXIDE-SIMETH 200-200-20 MG/5ML PO SUSP: 15.0000 mL | ORAL | Status: DC | PRN

## 2022-12-29 MED ORDER — ESMOLOL HCL 100 MG/10ML IV SOLN
INTRAVENOUS | Status: DC | PRN
  Administered 2022-12-29 (×2): 30 mg via INTRAVENOUS

## 2022-12-29 MED ORDER — ONDANSETRON HCL 4 MG/2ML IJ SOLN
INTRAMUSCULAR | Status: DC | PRN
  Administered 2022-12-29: 4 mg via INTRAVENOUS

## 2022-12-29 MED ORDER — CEFAZOLIN IN SODIUM CHLORIDE 3-0.9 GM/100ML-% IV SOLN
3.0000 g | INTRAVENOUS | Status: AC
  Administered 2022-12-29: 3 g via INTRAVENOUS

## 2022-12-29 MED ORDER — DROPERIDOL 2.5 MG/ML IJ SOLN: 0.6250 mg | Freq: Once | INTRAMUSCULAR | Status: DC | PRN

## 2022-12-29 MED ORDER — PANTOPRAZOLE SODIUM 40 MG PO TBEC
40.0000 mg | DELAYED_RELEASE_TABLET | Freq: Every day | ORAL | Status: DC
Start: 2022-12-30 — End: 2023-01-10
  Administered 2022-12-30 – 2023-01-10 (×11): 40 mg via ORAL
  Filled 2022-12-29 (×11): qty 1

## 2022-12-29 MED ORDER — OXYCODONE HCL 5 MG PO TABS: 5.0000 mg | ORAL_TABLET | ORAL | Status: DC | PRN

## 2022-12-29 MED ORDER — POVIDONE-IODINE 10 % EX SWAB: 2.0000 | Freq: Once | CUTANEOUS | Status: DC

## 2022-12-29 MED ORDER — LABETALOL HCL 5 MG/ML IV SOLN: 10.0000 mg | INTRAVENOUS | Status: DC | PRN

## 2022-12-29 MED ORDER — CEFAZOLIN SODIUM-DEXTROSE 2-4 GM/100ML-% IV SOLN
2.0000 g | Freq: Three times a day (TID) | INTRAVENOUS | Status: AC
  Administered 2022-12-30: 2 g via INTRAVENOUS
  Filled 2022-12-29: qty 100

## 2022-12-29 MED ORDER — SUCCINYLCHOLINE CHLORIDE 200 MG/10ML IV SOSY
PREFILLED_SYRINGE | INTRAVENOUS | Status: DC | PRN
  Administered 2022-12-29: 120 mg via INTRAVENOUS

## 2022-12-29 MED ORDER — BISACODYL 5 MG PO TBEC: 5.0000 mg | DELAYED_RELEASE_TABLET | Freq: Every day | ORAL | Status: DC | PRN

## 2022-12-29 MED ORDER — ENSURE PRE-SURGERY PO LIQD
296.0000 mL | Freq: Once | ORAL | Status: DC
  Filled 2022-12-29: qty 296

## 2022-12-29 MED ORDER — JUVEN PO PACK
1.0000 | PACK | Freq: Two times a day (BID) | ORAL | Status: DC
Start: 2022-12-30 — End: 2023-01-10
  Administered 2023-01-01 – 2023-01-09 (×3): 1 via ORAL
  Filled 2022-12-29 (×7): qty 1

## 2022-12-29 MED ORDER — PROPOFOL 10 MG/ML IV BOLUS
INTRAVENOUS | Status: DC | PRN
  Administered 2022-12-29: 150 mg via INTRAVENOUS

## 2022-12-29 MED ORDER — HYDROMORPHONE HCL 1 MG/ML IJ SOLN: 0.5000 mg | INTRAMUSCULAR | Status: DC | PRN

## 2022-12-29 MED ORDER — FENTANYL CITRATE (PF) 250 MCG/5ML IJ SOLN
INTRAMUSCULAR | Status: AC
  Filled 2022-12-29: qty 5

## 2022-12-29 MED ORDER — HYDROMORPHONE HCL 1 MG/ML IJ SOLN
INTRAMUSCULAR | Status: AC
  Filled 2022-12-29: qty 1

## 2022-12-29 MED ORDER — HYDROMORPHONE HCL 1 MG/ML IJ SOLN
0.5000 mg | INTRAMUSCULAR | Status: DC | PRN
  Administered 2022-12-30 (×2): 1 mg via INTRAVENOUS
  Filled 2022-12-29 (×2): qty 1

## 2022-12-29 MED ORDER — ORAL CARE MOUTH RINSE: 15.0000 mL | Freq: Once | OROMUCOSAL | Status: AC

## 2022-12-29 MED ORDER — POTASSIUM CHLORIDE CRYS ER 20 MEQ PO TBCR: 20.0000 meq | EXTENDED_RELEASE_TABLET | Freq: Every day | ORAL | Status: DC | PRN

## 2022-12-29 MED ORDER — SODIUM CHLORIDE 0.9% IV SOLUTION: Freq: Once | INTRAVENOUS | Status: AC

## 2022-12-29 MED ORDER — KETAMINE HCL 50 MG/5ML IJ SOSY
PREFILLED_SYRINGE | INTRAMUSCULAR | Status: AC
Start: 2022-12-29 — End: ?
  Filled 2022-12-29: qty 5

## 2022-12-29 MED ORDER — HYDROMORPHONE HCL 1 MG/ML IJ SOLN
INTRAMUSCULAR | Status: DC | PRN
  Administered 2022-12-29: .5 mg via INTRAVENOUS

## 2022-12-29 MED ORDER — ZINC SULFATE 220 (50 ZN) MG PO CAPS
220.0000 mg | ORAL_CAPSULE | Freq: Every day | ORAL | Status: DC
  Administered 2022-12-30 – 2023-01-10 (×10): 220 mg via ORAL
  Filled 2022-12-29 (×11): qty 1

## 2022-12-29 MED ORDER — GERHARDT'S BUTT CREAM
TOPICAL_CREAM | Freq: Two times a day (BID) | CUTANEOUS | Status: DC
  Filled 2022-12-29 (×2): qty 1

## 2022-12-29 MED ORDER — METOPROLOL TARTRATE 5 MG/5ML IV SOLN: 2.0000 mg | INTRAVENOUS | Status: DC | PRN

## 2022-12-29 MED ORDER — MAGNESIUM SULFATE 2 GM/50ML IV SOLN: 2.0000 g | Freq: Every day | INTRAVENOUS | Status: DC | PRN

## 2022-12-29 MED ORDER — HYDROMORPHONE HCL 1 MG/ML IJ SOLN
INTRAMUSCULAR | Status: AC
  Administered 2022-12-29: 1 mg via INTRAVENOUS
  Filled 2022-12-29: qty 1

## 2022-12-29 MED ORDER — MAGNESIUM CITRATE PO SOLN: 1.0000 | Freq: Once | ORAL | Status: DC | PRN

## 2022-12-29 MED ORDER — HYDRALAZINE HCL 20 MG/ML IJ SOLN: 5.0000 mg | INTRAMUSCULAR | Status: DC | PRN

## 2022-12-29 MED ORDER — TRANEXAMIC ACID 1000 MG/10ML IV SOLN
2000.0000 mg | INTRAVENOUS | Status: DC
  Filled 2022-12-29: qty 20

## 2022-12-29 MED ORDER — HYDROMORPHONE HCL 1 MG/ML IJ SOLN
INTRAMUSCULAR | Status: AC
  Filled 2022-12-29: qty 0.5

## 2022-12-29 MED ORDER — PROPOFOL 10 MG/ML IV BOLUS
INTRAVENOUS | Status: AC
  Filled 2022-12-29: qty 20

## 2022-12-29 MED ORDER — TRANEXAMIC ACID-NACL 1000-0.7 MG/100ML-% IV SOLN: 1000.0000 mg | INTRAVENOUS | Status: DC

## 2022-12-29 MED ORDER — PHENYLEPHRINE 80 MCG/ML (10ML) SYRINGE FOR IV PUSH (FOR BLOOD PRESSURE SUPPORT)
PREFILLED_SYRINGE | INTRAVENOUS | Status: DC | PRN
  Administered 2022-12-29: 160 ug via INTRAVENOUS

## 2022-12-29 SURGICAL SUPPLY — 45 items
BAG COUNTER SPONGE SURGICOUNT (BAG) IMPLANT
BAG SPNG CNTER NS LX DISP (BAG)
BLADE SAW RECIP 87.9 MT (BLADE) ×1 IMPLANT
BLADE SURG 21 STRL SS (BLADE) ×1 IMPLANT
BNDG CMPR 5X6 CHSV STRCH STRL (GAUZE/BANDAGES/DRESSINGS)
BNDG COHESIVE 6X5 TAN ST LF (GAUZE/BANDAGES/DRESSINGS) IMPLANT
CANISTER WOUND CARE 500ML ATS (WOUND CARE) ×1 IMPLANT
COVER SURGICAL LIGHT HANDLE (MISCELLANEOUS) ×1 IMPLANT
CUFF TOURN SGL QUICK 34 (TOURNIQUET CUFF) ×1
CUFF TRNQT CYL 34X4.125X (TOURNIQUET CUFF) ×1 IMPLANT
DRAPE DERMATAC (DRAPES) IMPLANT
DRAPE INCISE IOBAN 66X45 STRL (DRAPES) ×1 IMPLANT
DRAPE U-SHAPE 47X51 STRL (DRAPES) ×1 IMPLANT
DRESSING PEEL AND PLC PRVNA 13 (GAUZE/BANDAGES/DRESSINGS) IMPLANT
DRESSING PREVENA PLUS CUSTOM (GAUZE/BANDAGES/DRESSINGS) ×1 IMPLANT
DRSG PEEL AND PLACE PREVENA 13 (GAUZE/BANDAGES/DRESSINGS) ×1
DRSG PREVENA PLUS CUSTOM (GAUZE/BANDAGES/DRESSINGS) ×1
DURAPREP 26ML APPLICATOR (WOUND CARE) ×1 IMPLANT
ELECT REM PT RETURN 9FT ADLT (ELECTROSURGICAL) ×1
ELECTRODE REM PT RTRN 9FT ADLT (ELECTROSURGICAL) ×1 IMPLANT
GLOVE BIOGEL PI IND STRL 9 (GLOVE) ×1 IMPLANT
GLOVE SURG ORTHO 9.0 STRL STRW (GLOVE) ×1 IMPLANT
GOWN STRL REUS W/ TWL XL LVL3 (GOWN DISPOSABLE) ×2 IMPLANT
GOWN STRL REUS W/TWL XL LVL3 (GOWN DISPOSABLE) ×2
GRAFT SKIN WND MICRO 38 (Tissue) IMPLANT
KIT BASIN OR (CUSTOM PROCEDURE TRAY) ×1 IMPLANT
KIT TURNOVER KIT B (KITS) ×1 IMPLANT
MANIFOLD NEPTUNE II (INSTRUMENTS) ×1 IMPLANT
NS IRRIG 1000ML POUR BTL (IV SOLUTION) ×1 IMPLANT
PACK ORTHO EXTREMITY (CUSTOM PROCEDURE TRAY) ×1 IMPLANT
PAD ARMBOARD 7.5X6 YLW CONV (MISCELLANEOUS) ×1 IMPLANT
PREVENA RESTOR ARTHOFORM 33X30 (CANNISTER) IMPLANT
PREVENA RESTOR ARTHOFORM 46X30 (CANNISTER) ×1 IMPLANT
SPONGE T-LAP 18X18 ~~LOC~~+RFID (SPONGE) IMPLANT
STAPLER VISISTAT 35W (STAPLE) IMPLANT
STOCKINETTE IMPERVIOUS LG (DRAPES) ×1 IMPLANT
SUT ETHILON 2 0 PSLX (SUTURE) IMPLANT
SUT SILK 2 0 (SUTURE) ×1
SUT SILK 2-0 18XBRD TIE 12 (SUTURE) ×1 IMPLANT
SUT VIC AB 1 CTX 27 (SUTURE) ×2 IMPLANT
SUT VIC AB 1 CTX 36 (SUTURE) ×1
SUT VIC AB 1 CTX36XBRD ANBCTRL (SUTURE) IMPLANT
TOWEL GREEN STERILE (TOWEL DISPOSABLE) ×1 IMPLANT
TUBE CONNECTING 12X1/4 (SUCTIONS) ×1 IMPLANT
YANKAUER SUCT BULB TIP NO VENT (SUCTIONS) ×1 IMPLANT

## 2022-12-29 NOTE — Transfer of Care (Signed)
Immediate Anesthesia Transfer of Care Note  Patient: Deborah [name]  Procedure(s) Performed: RIGHT BELOW KNEE AMPUTATION (Right: Knee)  Patient Location: PACU  Anesthesia Type:General  Level of Consciousness: awake, drowsy, and patient cooperative  Airway & Oxygen Therapy: Patient Spontanous Breathing  Post-op Assessment: Report given to RN and Post -op Vital signs reviewed and stable  Post vital signs: Reviewed and stable  Last Vitals:  Vitals Value Taken Time  BP 134/90 12/29/22 1619  Temp    Pulse 116 12/29/22 1622  Resp 14 12/29/22 1622  SpO2 100 % 12/29/22 1622  Vitals shown include unfiled device data.  Last Pain:  Vitals:   12/29/22 1445  TempSrc: Oral  PainSc:       Patients Stated Pain Goal: 0 (12/29/22 4098)  Complications: No notable events documented.

## 2022-12-29 NOTE — Anesthesia Preprocedure Evaluation (Addendum)
Anesthesia Evaluation    Reviewed: Allergy & Precautions, Patient's Chart, lab work & pertinent test results, reviewed documented beta blocker date and time   History of Anesthesia Complications Negative for: history of anesthetic complications  Airway Mallampati: II  TM Distance: >3 FB Neck ROM: Full    Dental  (+) Missing, Dental Advisory Given   Pulmonary neg pulmonary ROS   Pulmonary exam normal breath sounds clear to auscultation       Cardiovascular hypertension, Pt. on medications and Pt. on home beta blockers +CHF   Rhythm:Regular Rate:Tachycardia  TTE 2024: EF  55-60%, grade 1 DD   Hypertensive with diastolic pressures over 100 consistently    Neuro/Psych   Anxiety Depression    CVA    GI/Hepatic Neg liver ROS, PUD,GERD  Medicated,,  Endo/Other  diabetes, Type 2, Insulin Dependent  Morbid obesity  Renal/GU Renal InsufficiencyRenal disease     Musculoskeletal  (+)  Fibromyalgia -  Abdominal   Peds  Hematology  (+) Blood dyscrasia (Hgb 6.4), anemia   Anesthesia Other Findings Day of surgery medications reviewed with patient.  Reproductive/Obstetrics                             Anesthesia Physical Anesthesia Plan  ASA: 3  Anesthesia Plan: General   Post-op Pain Management: Tylenol PO (pre-op)*, Ketamine IV* and Dilaudid IV   Induction: Intravenous  PONV Risk Score and Plan: 3 and Ondansetron, Dexamethasone, Treatment may vary due to age or medical condition and Midazolam  Airway Management Planned: Oral ETT  Additional Equipment: None  Intra-op Plan:   Post-operative Plan: Extubation in OR  Informed Consent: I have reviewed the patients History and Physical, chart, labs and discussed the procedure including the risks, benefits and alternatives for the proposed anesthesia with the patient or authorized representative who has indicated his/her understanding and  acceptance.     Dental advisory given  Plan Discussed with: CRNA and Surgeon  Anesthesia Plan Comments:         Anesthesia Quick Evaluation

## 2022-12-29 NOTE — Anesthesia Postprocedure Evaluation (Signed)
Anesthesia Post Note  Patient: Deborah [name]  Procedure(s) Performed: RIGHT BELOW KNEE AMPUTATION (Right: Knee)     Patient location during evaluation: PACU Anesthesia Type: General Level of consciousness: awake and alert Pain management: pain level controlled Vital Signs Assessment: post-procedure vital signs reviewed and stable Respiratory status: spontaneous breathing, nonlabored ventilation, respiratory function stable and patient connected to nasal cannula oxygen Cardiovascular status: blood pressure returned to baseline and stable Postop Assessment: no apparent nausea or vomiting Anesthetic complications: no  No notable events documented.  Last Vitals:  Vitals:   12/29/22 1620 12/29/22 1630  BP:  (!) 141/79  Pulse:  (!) 113  Resp:  12  Temp: (!) 36.3 C   SpO2:  99%    Last Pain:  Vitals:   12/29/22 1620  TempSrc:   PainSc: 10-Worst pain ever                 [name] S

## 2022-12-29 NOTE — Anesthesia Procedure Notes (Signed)
Procedure Name: Intubation Date/Time: 12/29/2022 3:14 PM  Performed by: Gus Puma, CRNAPre-anesthesia Checklist: Patient identified, Emergency Drugs available, Suction available and Patient being monitored Patient Re-evaluated:Patient Re-evaluated prior to induction Oxygen Delivery Method: Circle System Utilized Preoxygenation: Pre-oxygenation with 100% oxygen Induction Type: IV induction Ventilation: Mask ventilation without difficulty Laryngoscope Size: Mac and 4 Grade View: Grade I Tube type: Oral Tube size: 7.0 mm Number of attempts: 1 Airway Equipment and Method: Stylet and Oral airway Placement Confirmation: ETT inserted through vocal cords under direct vision, positive ETCO2 and breath sounds checked- equal and bilateral Secured at: 21 cm Tube secured with: Tape Dental Injury: Teeth and Oropharynx as per pre-operative assessment

## 2022-12-29 NOTE — Plan of Care (Signed)
  Problem: Activity: Goal: Ability to return to baseline activity level will improve Outcome: Not Progressing   Problem: Metabolic: Goal: Ability to maintain appropriate glucose levels will improve Outcome: Not Progressing   Problem: Skin Integrity: Goal: Risk for impaired skin integrity will decrease Outcome: Not Progressing   Problem: Tissue Perfusion: Goal: Adequacy of tissue perfusion will improve Outcome: Not Progressing   Problem: Pain Managment: Goal: General experience of comfort will improve Outcome: Not Progressing

## 2022-12-29 NOTE — Progress Notes (Signed)
OT Cancellation Note  Patient Details Name: Deborah [name] MRN: [id number] DOB: 1965-04-30   Cancelled Treatment:    Reason Eval/Treat Not Completed: (P) Patient at procedure or test/ unavailable, surgery planned today, will reassess needs for therapy tomorrow.  Alexis Goodell 12/29/2022, 1:49 PM

## 2022-12-29 NOTE — Progress Notes (Signed)
Deborah [name]  UJW:[id number] DOB: March 04, 1966 DOA: 12/14/2022 PCP: Pcp, No    Brief Narrative:  57 year old with a history of ESRD on HD MWF, PAF on Eliquis, diastolic CHF, HTN, DM2, malignant duodenal carcinoid, NAFLD, morbid obesity, and a chronic right ankle ulcer who had recently been admitted 9/16-9/24/2024 at which time she underwent an angiogram noting adequate perfusion to her right foot with ultimate disposition being treatment for gout of the right ankle.  She returned to the ER 10/8 and was found to have a hemoglobin of 5.8 with crampy abdominal pain and a history of BRBPR.  Since the time of her admission she has experienced recurrent GI bleeding.  Ultimately she was taken for an EGD and capsule endoscopy.  Additionally she has been confirmed to have osteomyelitis of the right ankle with plans for right BKA 10/23.  Goals of Care:   Code Status: Full Code   DVT prophylaxis: SCDs Start: 12/14/22 2037   Interim Hx: Afebrile.  Vital signs stable.  Hemoglobin low again on follow-up this morning resulting in transfusion.  Alert and conversant at the time of my visit but reporting ongoing pain of her right lower extremity.  No chest pain or shortness of breath.  Assessment & Plan:  Acute anemia due to GI bleeding/blood loss on chronic anemia of ESRD Has required 4 units PRBC total during this hospital stay -EGD noted minimal reflux esophagitis with normal-appearing duodenum and 3 small AVMs with 1 having an adherent clot -some degree of rebleeding expected if she is to resume anticoagulation -not a candidate for long-term Carafate due to ESRD (will need to stop after 2 weeks max) -outpatient colonoscopy is recommended  ESRD on HD MWF Ongoing care per nephrology team -will need superficialization of her L brachiocephalic AV fistula during this hospital stay -vascular surgery following  Chronic paroxysmal atrial fibrillation previously on DOAC Eliquis on hold -may not be able to  resume given ongoing GI bleeding and known AVMs  Chronic right heel wound with acutely diagnosed osteomyelitis No evidence of osteomyelitis on MRI last month -repeat MRI 10/14 now consistent with early osteomyelitis -Dr. Lajoyce Corners performed BKA 10/23  Acute toxic encephalopathy due to opiate medication MRI brain unrevealing -care with narcotic dosing  DM2 CBG quite variable at present -follow post-operatively  Duodenal carcinoid Status post resection  Chronic diastolic CHF Volume management per HD  Chronic back pain -lumbar spinal stenosis with neurogenic claudication Chronically on Dilaudid at home -reportedly wheelchair-bound for several months -MRI lumbar spine earlier this year noted lumbar spondylosis worsening since 2019 -evaluated by Neurosurgery March 2024 and not felt to be appropriate for surgical approach with suggestion to continue physical therapy - will need SNF at time of discharge  Morbid obesity with NAFLD - Body mass index is 45.19 kg/m.  Mood disorder Continue usual duloxetine -diazepam added this admission  Gout Continue usual allopurinol   Family Communication: No family present at time of exam Disposition: Anticipate need for SNF rehab stay   Objective: Blood pressure 128/69, pulse 99, temperature 97.9 F (36.6 C), resp. rate 16, height 5\' 6"  (1.676 m), weight 127 kg, last menstrual period 10/10/2012, SpO2 95%.  Intake/Output Summary (Last 24 hours) at 12/29/2022 1822 Last data filed at 12/29/2022 1615 Gross per 24 hour  Intake 520 ml  Output 0 ml  Net 520 ml   Filed Weights   12/26/22 0500 12/28/22 0432 12/29/22 0500  Weight: 124.1 kg 123.8 kg 127 kg    Examination: General: No acute  respiratory distress Lungs: Clear to auscultation bilaterally without wheezes or crackles Cardiovascular: Regular rate and rhythm without murmur gallop or rub normal S1 and S2 Abdomen: Nontender, nondistended, soft, bowel sounds positive, no rebound, no ascites, no  appreciable mass Extremities: No significant cyanosis, clubbing, or edema bilateral lower extremities  CBC: Recent Labs  Lab 12/25/22 0227 12/27/22 0921 12/29/22 0410 12/29/22 1434  WBC 20.2* 22.8* 18.1*  --   HGB 7.0* 7.6* 6.4* 8.8*  HCT 23.7* 25.5* 22.2* 26.0*  MCV 96.0 96.2 96.5  --   PLT 243 342 321  --    Basic Metabolic Panel: Recent Labs  Lab 12/24/22 0712 12/25/22 0227 12/27/22 0921 12/28/22 1259 12/29/22 1434  NA 134* 135 136 137 138  K 3.2* 3.6 2.7* 3.1* 3.4*  CL 97* 101 105 100 99  CO2 23 21* 23 27  --   GLUCOSE 107* 88 115* 169* 79  BUN 18 18 27* 17 31*  CREATININE 4.19* 4.09* 4.70* 3.48* 4.30*  CALCIUM 8.6* 8.0* 7.9* 8.1*  --   PHOS 3.3  --   --  3.2  --    GFR: Estimated Creatinine Clearance: 19.5 mL/min (A) (by C-G formula based on SCr of 4.3 mg/dL (H)).   Scheduled Meds:  (feeding supplement) PROSource Plus  30 mL Oral BID BM   acidophilus  2 capsule Oral Daily   allopurinol  100 mg Oral BID   atorvastatin  10 mg Oral QHS   busPIRone  10 mg Oral BID   darbepoetin (ARANESP) injection - DIALYSIS  200 mcg Subcutaneous Q Fri-1800   doxercalciferol  7 mcg Intravenous Q M,W,F-HD   DULoxetine  40 mg Oral Daily   feeding supplement  296 mL Oral Once   feeding supplement (NEPRO CARB STEADY)  237 mL Oral BID BM   ferric citrate  420 mg Oral TID WC   folic acid  1 mg Oral Daily   Gerhardt's butt cream   Topical BID   HYDROmorphone       HYDROmorphone       insulin aspart  0-6 Units Subcutaneous TID WC   insulin aspart  2 Units Subcutaneous TID WC   insulin glargine-yfgn  8 Units Subcutaneous QHS   leptospermum manuka honey  1 Application Topical Daily   midazolam       midodrine  5 mg Oral TID WC   mirtazapine  7.5 mg Oral QHS   mupirocin ointment  1 Application Nasal BID   pantoprazole  40 mg Oral BID AC   sucralfate  1 g Oral TID WC & HS      LOS: 15 days   Lonia Blood, MD [facility] Office  [phone] Pager - Text Page  per Loretha Stapler  If 7PM-7AM, please contact night-coverage per Amion 12/29/2022, 6:22 PM

## 2022-12-29 NOTE — Op Note (Signed)
12/29/2022  4:14 PM  PATIENT:  Deborah [name]    PRE-OPERATIVE DIAGNOSIS:  Right Foot Osteomyelitis/Abscess  POST-OPERATIVE DIAGNOSIS:  Same  PROCEDURE:  RIGHT BELOW KNEE AMPUTATION Application of Kerecis micro graft 38 cm  Application of Prevena customizable and Prevena arthroform wound VAC dressings Application of Vive Wear stump shrinker and the Hanger limb protector  SURGEON:  Nadara Mustard, MD  ANESTHESIA:   General  PREOPERATIVE INDICATIONS:  [name] is a  57 y.o. female with a diagnosis of Right Foot Osteomyelitis/Abscess who failed conservative measures and elected for surgical management.    The risks benefits and alternatives were discussed with the patient preoperatively including but not limited to the risks of infection, bleeding, nerve injury, cardiopulmonary complications, the need for revision surgery, among others, and the patient was willing to proceed.  OPERATIVE IMPLANTS:   Implant Name Type Inv. Item Serial No. Manufacturer Lot No. LRB No. Used Action  GRAFT SKIN WND MICRO 38 - [id number] Tissue GRAFT SKIN WND MICRO 38  [employer] [id number] Right 1 Implanted     OPERATIVE FINDINGS: thin atrophic skin  OPERATIVE PROCEDURE: Patient was brought to the operating room after undergoing a regional anesthetic.  After adequate levels anesthesia were obtained a thigh tourniquet was placed and the lower extremity was prepped using DuraPrep draped into a sterile field. The foot was draped out of the sterile field with impervious stockinette.  A timeout was called and the tourniquet inflated.  A transverse skin incision was made 12 cm distal to the tibial tubercle, the incision curved proximally, and a large posterior flap was created.  The tibia was transected just proximal to the skin incision and beveled anteriorly.  The fibula was transected just proximal to the tibial incision.  The sciatic nerve was pulled cut and allowed to retract.  The vascular  bundles were suture ligated with 2-0 silk.  The tourniquet was deflated and hemostasis obtained.      The Kerecis micro powder 38 cm was applied to the open wound that has a 200 cm surface area.    The deep and superficial fascial layers were closed using #1 Vicryl.  The skin was closed using staples.    The Prevena customizable dressing was applied this was overwrapped with the arthroform sponge.  [name] was used to secure the sponges and the circumferential compression was secured to the skin with Dermatac.  This was connected to the wound VAC pump and had a good suction fit this was covered with a stump shrinker and a limb protector.  Patient was taken to the PACU in stable condition.   DISCHARGE PLANNING:  Antibiotic duration: 24-hour antibiotics  Weightbearing: Nonweightbearing on the operative extremity  Pain medication: Opioid pathway  Dressing care/ Wound VAC: Continue wound VAC with the Prevena plus pump at discharge for 1 week  Ambulatory devices: Walker or kneeling scooter  Discharge to: Discharge planning based on recommendations per physical therapy  Follow-up: In the office 1 week after discharge.

## 2022-12-29 NOTE — Progress Notes (Signed)
Hgb is 6.4 this am. No overt bleeding. She is scheduled for BKA today. Plan to transfuse 2 units RBC.

## 2022-12-29 NOTE — Interval H&P Note (Signed)
History and Physical Interval Note:  12/29/2022 6:42 AM  Deborah [name]  has presented today for surgery, with the diagnosis of Right Foot Osteomyelitis/Abscess.  The various methods of treatment have been discussed with the patient and family. After consideration of risks, benefits and other options for treatment, the patient has consented to  Procedure(s): RIGHT BELOW KNEE AMPUTATION (Right) as a surgical intervention.  The patient's history has been reviewed, patient examined, no change in status, stable for surgery.  I have reviewed the patient's chart and labs.  Questions were answered to the patient's satisfaction.     Nadara Mustard

## 2022-12-29 NOTE — Progress Notes (Signed)
Explained to patient the need to be nPO at 0430 and that she will have to drink a presurgery ensure, patient stated she doesn't want to drink any ensure before surgery, she also will not have pre surgery CHG bath as she is allergic to CHG and it is noted in her allergy list, consent also not yet signed as she said no one has explained the surgery to her yet.

## 2022-12-29 NOTE — Progress Notes (Signed)
Pt came in from 82M, no IV access. IV team came in and able to start a new IV. Blood transfusion restarted at 1421.

## 2022-12-29 NOTE — Progress Notes (Signed)
Subjective:  Patient seen and examined bedside. No acute events. Hgb 6.4 this am, to receive 2u prbc. Open for rt bka and HD later today.  Objective Vital signs in last 24 hours: Vitals:   12/28/22 2038 12/29/22 0111 12/29/22 0500 12/29/22 0737  BP: (!) 102/52 (!) 99/53  (!) 109/54  Pulse: (!) 106 100  99  Resp: 18 18  17   Temp: 98 F (36.7 C) 98.7 F (37.1 C)  98.5 F (36.9 C)  TempSrc: Oral Oral    SpO2: 100% 100%  99%  Weight:   127 kg   Height:       Weight change: 3.213 kg  Physical Exam: General: Chronically ill obese female NAD, laying flat in bed, NAD Heart: RRR no MRG Lungs: CTA bilaterally nonlabored breathing Abdomen: Obese NABS soft NTND Extremities: Bilateral heels with dressings dry clean, foul odor, 1+ bipedal edema Dialysis Access: Right IJ TDC/LUA AVF  + bruit appears deep   Dialysis Orders: MWF [facility]   F180   400/800   3K/2.5 bath  126.9kg   [facility]  /left upper arm AV fistula placed April 2024 - mircera 225 mcg last 10/5 - hectorol 7 mcg   Problem/Plan: ESRD: Back to usual MWF schedule - Hypokalemia= 2.7 yesterday refuse p.o. supplement given IV KCl and use 4K bath.  Lab results this a.m. pending Chronic Wounds - on R heel and LE.  Ortho following, open for Rt BKA 10/23 AMS (improving): MRI head completed with no acute changes.  Could be related to infection, workup per PMD/baseline =very anxious BP/volume: BP stable, edema on exam. Continue home meds, UF as tolerated. Below prior EDW - lower on discharge especially after amputation Acute on Chronic Anemia of ESRD:  S/p EGD Multiple gastric AVMs, treated with APC and capsule endo 10/17 w/ no acute bleeding but polyps.  Rec colonoscopy. Eliquis on hold. 2U PRBCs given this admit. Aranesp given 10/19, cont qwk. Hgb down to 6.4-2u prbc ordered by primary service Abdominal pain: CT scan without acute concern. GI resumed Reglan. Per primary service Secondary Hyperparathyroidism/Hyperphosphatemia:  CorrCa/Phos good, continue meds. Vascular access - TDC in place. Not working well last week HD requiring cath flow dwell. If continues to be an issue may need to have it exchanged./LUA AV fistula inserted 06/2022 appears very deep,may want to have VVS evaluate while here  Anthony Sar, MD [facility]  Labs: Basic Metabolic Panel: Recent Labs  Lab 12/24/22 0712 12/25/22 0227 12/27/22 0921 12/28/22 1259  NA 134* 135 136 137  K 3.2* 3.6 2.7* 3.1*  CL 97* 101 105 100  CO2 23 21* 23 27  GLUCOSE 107* 88 115* 169*  BUN 18 18 27* 17  CREATININE 4.19* 4.09* 4.70* 3.48*  CALCIUM 8.6* 8.0* 7.9* 8.1*  PHOS 3.3  --   --  3.2   Liver Function Tests: Recent Labs  Lab 12/28/22 1259  ALBUMIN 1.8*   No results for input(s): "LIPASE", "AMYLASE" in the last 168 hours. No results for input(s): "AMMONIA" in the last 168 hours. CBC: Recent Labs  Lab 12/24/22 0712 12/25/22 0227 12/27/22 0921 12/29/22 0410  WBC 22.0* 20.2* 22.8* 18.1*  HGB 7.6* 7.0* 7.6* 6.4*  HCT 25.4* 23.7* 25.5* 22.2*  MCV 95.8 96.0 96.2 96.5  PLT 345 243 342 321   Cardiac Enzymes: No results for input(s): "CKTOTAL", "CKMB", "CKMBINDEX", "TROPONINI" in the last 168 hours. CBG: Recent Labs  Lab 12/28/22 0809 12/28/22 1314 12/28/22 1745 12/28/22 2040 12/29/22 0737  GLUCAP  106* 154* 292* 135* 81    Studies/Results: VAS US DUPLEX DIALYSIS ACCESS (AVF, AVG)  Result Date: 12/28/2022 DIALYSIS ACCESS Patient Name:  Deborah [name]  Date of Exam:   12/28/2022 Medical Rec #: [id number]             Accession #:    [id number] Date of Birth: 08-10-1965             Patient Gender: F Patient Age:   57 years Exam Location:  [facility] Procedure:      VAS US DUPLEX DIALYSIS ACCESS (AVF, AVG) Referring Phys: [facility] --------------------------------------------------------------------------------  Reason for Exam: Non-maturation of AVF. Access Site: Left Upper Extremity. Access Type: 06/30/22.  History: HTN, DM-2, malignant carcinoid, NAFLD, morbid obesity, AFIB. Performing Technologist: Marilynne Halsted RDMS, RVT  Examination Guidelines: A complete evaluation includes B-mode imaging, spectral Doppler, color Doppler, and power Doppler as needed of all accessible portions of each vessel. Unilateral testing is considered an integral part of a complete examination. Limited examinations for reoccurring indications may be performed as noted.  Findings: +--------------------+----------+-----------------+--------+ AVF                 PSV (cm/s)Flow Vol (mL/min)Comments +--------------------+----------+-----------------+--------+ Native artery inflow   216           634                +--------------------+----------+-----------------+--------+ AVF Anastomosis        470                              +--------------------+----------+-----------------+--------+  +------------+----------+-------------+----------+-----------------------------+ OUTFLOW VEINPSV (cm/s)Diameter (cm)Depth (cm)          Describe            +------------+----------+-------------+----------+-----------------------------+ Shoulder       219        0.47        1.85                                 +------------+----------+-------------+----------+-----------------------------+ Prox UA        131        0.51        1.61                                 +------------+----------+-------------+----------+-----------------------------+ Mid UA         448        0.53        0.95    change in Diameter and 0.48                                                         down 0.29           +------------+----------+-------------+----------+-----------------------------+ Dist UA        382        0.81        0.22                                 +------------+----------+-------------+----------+-----------------------------+ AC Fossa       560  0.77        0.15                                  +------------+----------+-------------+----------+-----------------------------+   Summary: Patent left AVF with a change in diameter in the mid upper arm.  *See table(s) above for measurements and observations.    --------------------------------------------------------------------------------   Preliminary    Medications:   (feeding supplement) PROSource Plus  30 mL Oral BID BM   acidophilus  2 capsule Oral Daily   allopurinol  100 mg Oral BID   atorvastatin  10 mg Oral QHS   busPIRone  10 mg Oral BID   darbepoetin (ARANESP) injection - DIALYSIS  200 mcg Subcutaneous Q Fri-1800   doxercalciferol  7 mcg Intravenous Q M,W,F-HD   DULoxetine  40 mg Oral Daily   feeding supplement  296 mL Oral Once   feeding supplement (NEPRO CARB STEADY)  237 mL Oral BID BM   ferric citrate  420 mg Oral TID WC   folic acid  1 mg Oral Daily   Gerhardt's butt cream   Topical BID   insulin aspart  0-6 Units Subcutaneous TID WC   insulin aspart  2 Units Subcutaneous TID WC   insulin glargine-yfgn  8 Units Subcutaneous QHS   leptospermum manuka honey  1 Application Topical Daily   midodrine  5 mg Oral TID WC   mirtazapine  7.5 mg Oral QHS   mupirocin ointment  1 Application Nasal BID   pantoprazole  40 mg Oral BID AC   sucralfate  1 g Oral TID WC & HS

## 2022-12-29 NOTE — Progress Notes (Signed)
Upon starting blood transfusion patient's IV started leaking. Blood transfusion stopped and IV removed. Unable to establish new access and restart blood as patient was transferred to the OR for surgery. Blood product sent down with patient. Transfusion to be completed by surgical team.

## 2022-12-30 ENCOUNTER — Encounter (HOSPITAL_COMMUNITY): Payer: Self-pay | Admitting: Orthopedic Surgery

## 2022-12-30 DIAGNOSIS — D649 Anemia, unspecified: Secondary | ICD-10-CM | POA: Diagnosis not present

## 2022-12-30 LAB — CBC
HCT: 26.7 % — ABNORMAL LOW (ref 36.0–46.0)
Hemoglobin: 8 g/dL — ABNORMAL LOW (ref 12.0–15.0)
MCH: 28.5 pg (ref 26.0–34.0)
MCHC: 30 g/dL (ref 30.0–36.0)
MCV: 95 fL (ref 80.0–100.0)
Platelets: 334 10*3/uL (ref 150–400)
RBC: 2.81 MIL/uL — ABNORMAL LOW (ref 3.87–5.11)
RDW: 17.2 % — ABNORMAL HIGH (ref 11.5–15.5)
WBC: 20.6 10*3/uL — ABNORMAL HIGH (ref 4.0–10.5)
nRBC: 0 % (ref 0.0–0.2)

## 2022-12-30 LAB — RENAL FUNCTION PANEL
Albumin: 1.9 g/dL — ABNORMAL LOW (ref 3.5–5.0)
Anion gap: 9 (ref 5–15)
BUN: 17 mg/dL (ref 6–20)
CO2: 25 mmol/L (ref 22–32)
Calcium: 7.9 mg/dL — ABNORMAL LOW (ref 8.9–10.3)
Chloride: 100 mmol/L (ref 98–111)
Creatinine, Ser: 3.41 mg/dL — ABNORMAL HIGH (ref 0.44–1.00)
GFR, Estimated: 15 mL/min — ABNORMAL LOW (ref >60)
Glucose, Bld: 163 mg/dL — ABNORMAL HIGH (ref 70–99)
Phosphorus: 3.2 mg/dL (ref 2.5–4.6)
Potassium: 3 mmol/L — ABNORMAL LOW (ref 3.5–5.1)
Sodium: 134 mmol/L — ABNORMAL LOW (ref 135–145)

## 2022-12-30 LAB — GLUCOSE, CAPILLARY
Glucose-Capillary: 128 mg/dL — ABNORMAL HIGH (ref 70–99)
Glucose-Capillary: 120 mg/dL — ABNORMAL HIGH (ref 70–99)
Glucose-Capillary: 136 mg/dL — ABNORMAL HIGH (ref 70–99)
Glucose-Capillary: 160 mg/dL — ABNORMAL HIGH (ref 70–99)

## 2022-12-30 IMAGING — CT CT ABD-PELV W/O CM
2 of 4 series · 17 of 46 positions shown, 19 images · non-contrast
Comparison: CT abdomen/pelvis 11/07/2020

CLINICAL DATA: Epigastric pain

EXAM:
CT ABDOMEN AND PELVIS WITHOUT CONTRAST
TECHNIQUE: Multidetector CT imaging of the abdomen and pelvis was performed
following the standard protocol without IV contrast.

[Series 2: axial st · axial · 0.83mm/px · z∈[-474,-74]mm · 14 of 92 slices shown, 16 images]
[im 6/92  soft-tissue]
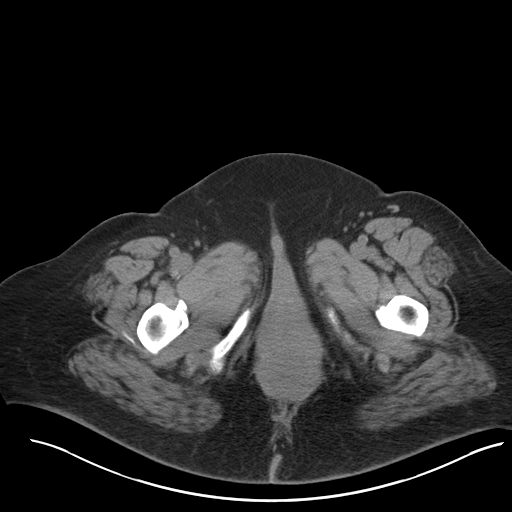
[im 6/92  bone]
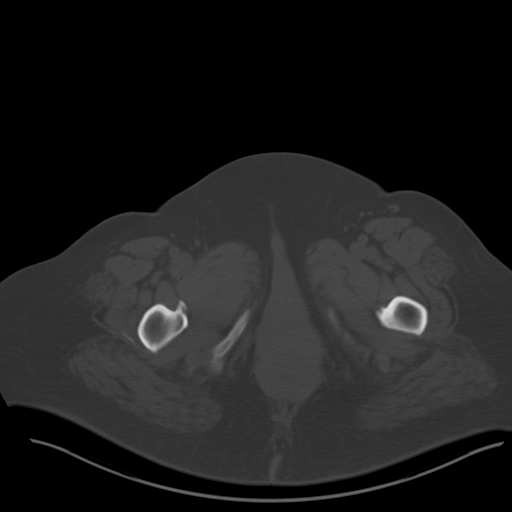
[im 11/92  soft-tissue]
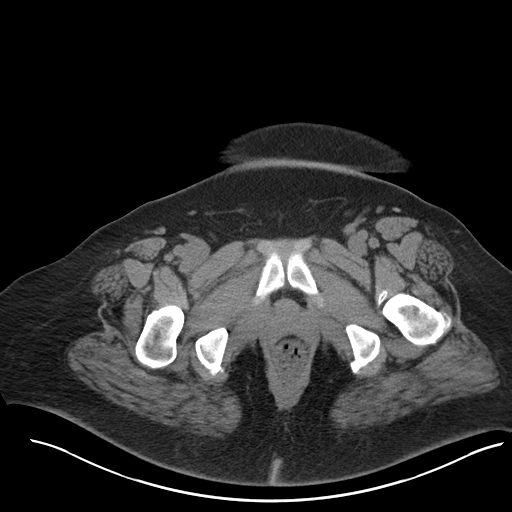
[im 17/92  soft-tissue]
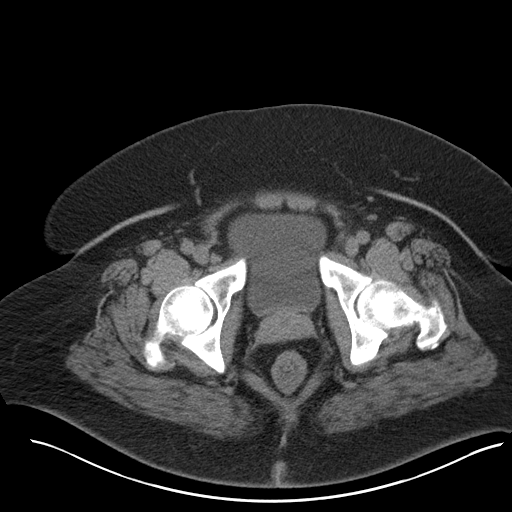
[im 27/92  soft-tissue]
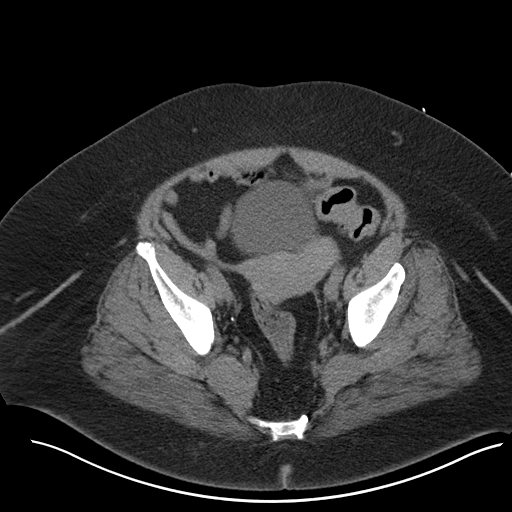
[im 33/92  soft-tissue]
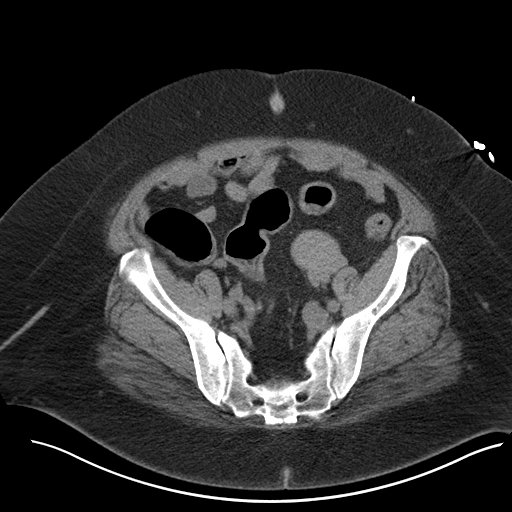
[im 38/92  soft-tissue]
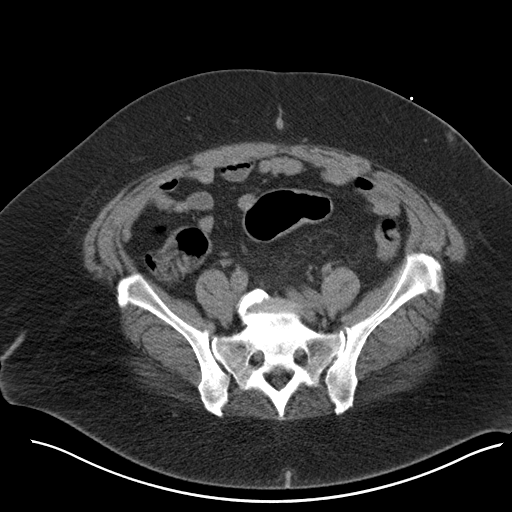
[im 43/92  soft-tissue]
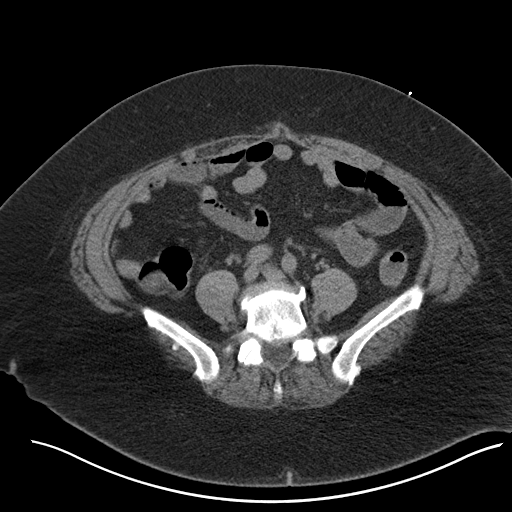
[im 49/92  soft-tissue]
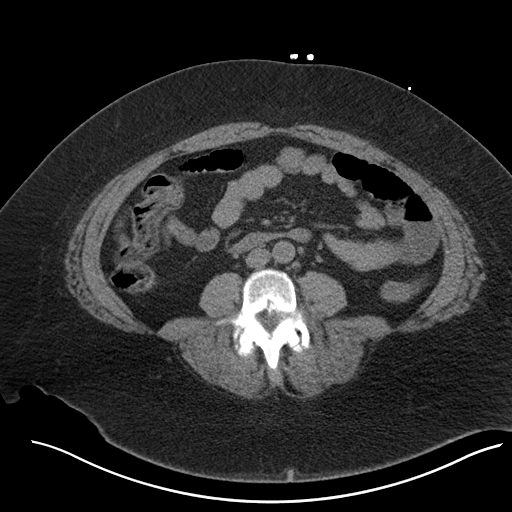
[im 54/92  soft-tissue]
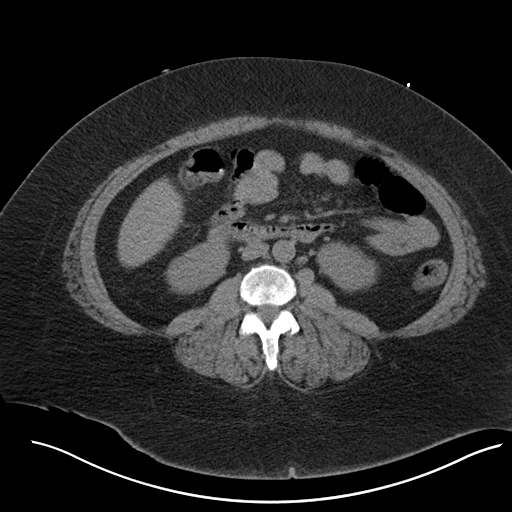
[im 54/92  bone]
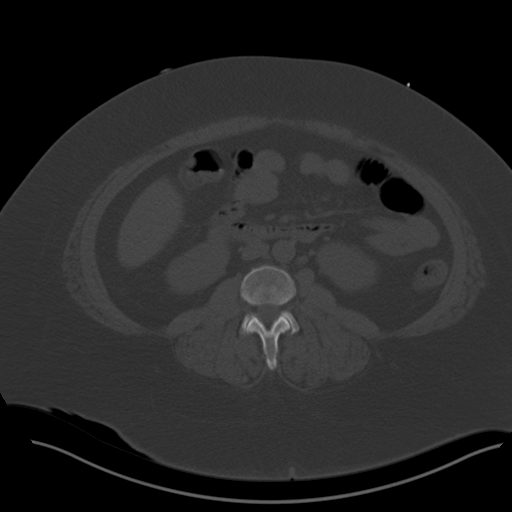
[im 59/92  soft-tissue]
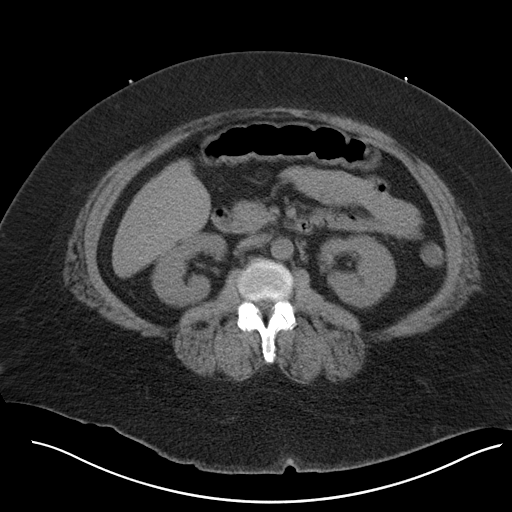
[im 70/92  soft-tissue]
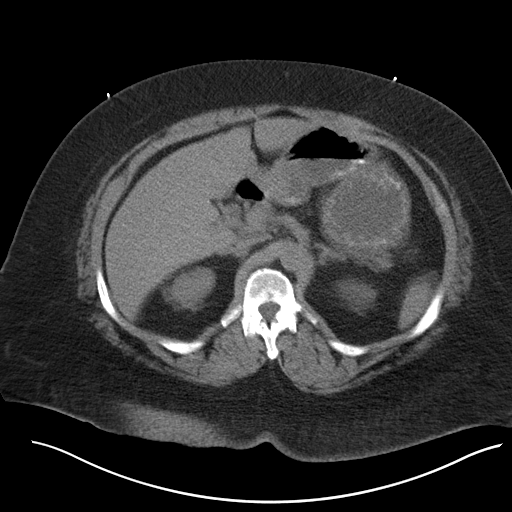
[im 75/92  soft-tissue]
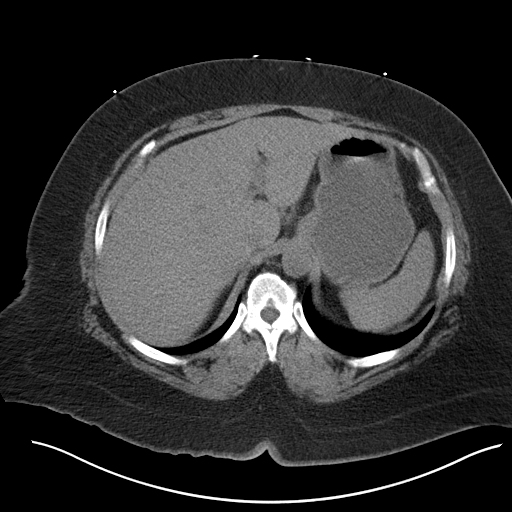
[im 81/92  soft-tissue]
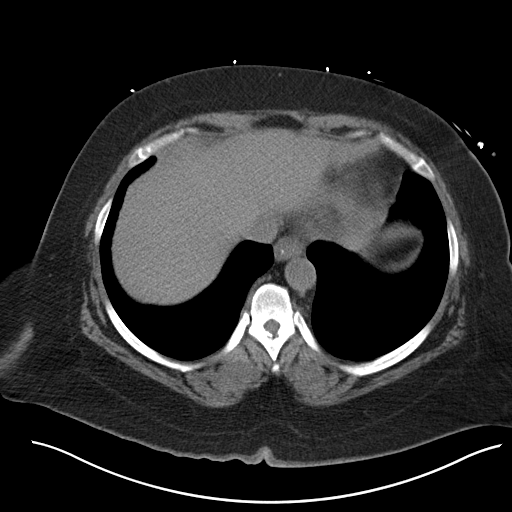
[im 86/92  soft-tissue]
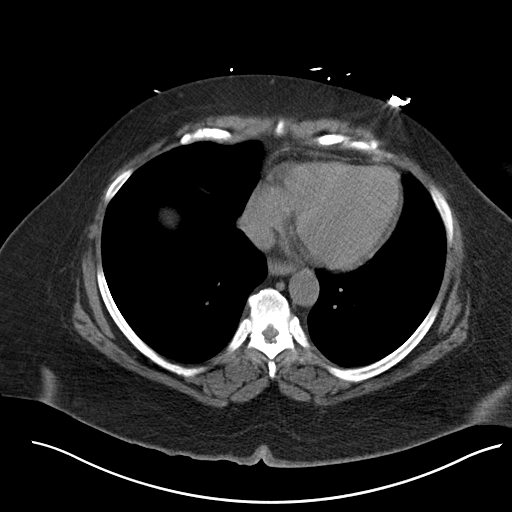

[Series 4: coronal st · coronal · 0.97mm/px · 3 of 196 slices shown]
[im 66/196  soft-tissue]
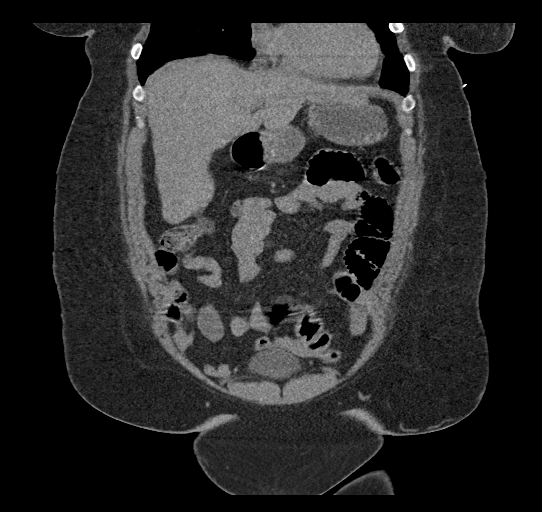
[im 87/196  soft-tissue]
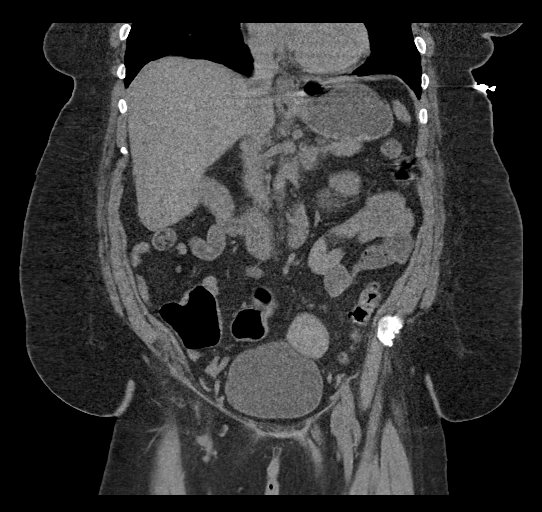
[im 109/196  soft-tissue]
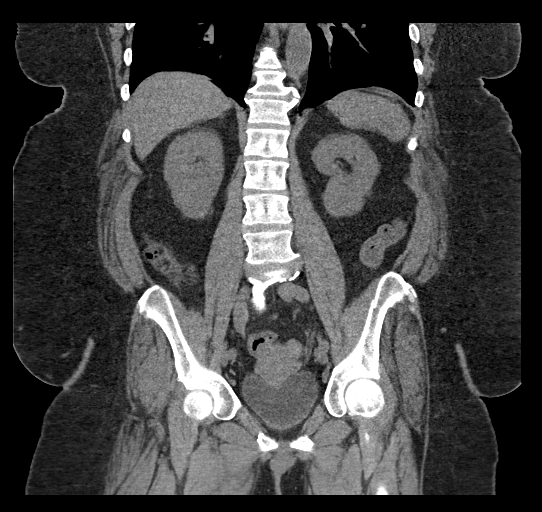

[17 of 46 positions shown; findings below may reference images not displayed]

FINDINGS: Lower chest: The lung bases are clear. The imaged heart is
unremarkable.

Hepatobiliary: The liver is unremarkable. The gallbladder is
surgically absent. There is no biliary ductal dilatation.

Pancreas: Unremarkable.

Spleen: Unremarkable

Adrenals/Urinary Tract: The adrenals are unremarkable.

The kidneys are normal, with no focal lesion, stone, hydronephrosis,
or hydroureter. The bladder is unremarkable.

Stomach/Bowel: The stomach is unremarkable. There is no evidence of
bowel obstruction. There is no abnormal bowel wall thickening or
inflammatory change. The appendix is normal.

Vascular/Lymphatic: The abdominal aorta is nonaneurysmal with
minimal calcified atherosclerotic plaque. There is no abdominal or
pelvic lymphadenopathy.

Reproductive: There is a 4.9 cm soft tissue density lesion in the
left adnexa, unchanged going back multiple studies. There is no new
adnexal lesion.

Other: There is no ascites or free air.

Musculoskeletal: There are degenerative changes at L5-S1 with bulky
anterior osteophytes, unchanged.
IMPRESSION: No acute findings in the abdomen or pelvis. No change since
11/07/2020.

Aortic Atherosclerosis (934M7-SPM.M).

## 2022-12-30 MED ORDER — HEPARIN SODIUM (PORCINE) 1000 UNIT/ML IJ SOLN
3800.0000 [IU] | Freq: Once | INTRAMUSCULAR | Status: AC
  Administered 2022-12-30: 3800 [IU]
  Filled 2022-12-30: qty 4

## 2022-12-30 MED ORDER — HYDROMORPHONE HCL 1 MG/ML IJ SOLN
1.0000 mg | INTRAMUSCULAR | Status: DC | PRN
  Administered 2022-12-30 – 2022-12-31 (×7): 2 mg via INTRAVENOUS
  Filled 2022-12-30 (×7): qty 2

## 2022-12-30 MED ORDER — METHOCARBAMOL 500 MG PO TABS
500.0000 mg | ORAL_TABLET | Freq: Four times a day (QID) | ORAL | Status: DC | PRN
  Administered 2022-12-30 – 2023-01-10 (×7): 500 mg via ORAL
  Filled 2022-12-30 (×7): qty 1

## 2022-12-30 MED ORDER — POTASSIUM CHLORIDE CRYS ER 20 MEQ PO TBCR
20.0000 meq | EXTENDED_RELEASE_TABLET | Freq: Two times a day (BID) | ORAL | Status: AC
  Administered 2022-12-30 (×2): 20 meq via ORAL
  Filled 2022-12-30 (×2): qty 1

## 2022-12-30 NOTE — Progress Notes (Signed)
Received patient in bed to unit.  Alert and oriented.  Informed consent signed and in chart.   TX duration: 1:25  Patient tolerated well.  Transported back to the room  Alert, without acute distress.  Hand-off given to patient's nurse.   Access used: RIJ TDC Access issues: None  Total UF removed: 1200 mL Medication(s) given: Oxycodone 15 mg, Hectorol 7 mcg Post HD VS: please see data insert    12/30/22 0330  Vitals  Temp 98.1 F (36.7 C)  Temp Source Axillary  BP (!) 151/57  MAP (mmHg) 81  BP Location Right Arm  BP Method Automatic  Patient Position (if appropriate) Lying  Pulse Rate (!) 111  Pulse Rate Source Monitor  ECG Heart Rate (!) 121  Resp 19  Oxygen Therapy  SpO2 99 %  O2 Device Room Air  Patient Activity (if Appropriate) In bed  Pulse Oximetry Type Continuous  Post Treatment  Dialyzer Clearance Lightly streaked  Hemodialysis Intake (mL) 0 mL  Liters Processed 23.1  Fluid Removed (mL) 1200 mL  Tolerated HD Treatment Yes  Post-Hemodialysis Comments Treatment terminated early per patient request d/t pain and discomfort; AMA obtained; blood returned without issue.  Note  Patient Observations Patient alert, reports continued RLE pain and discomfort, no acute distress noted; patient condition stable for return transport.  Hemodialysis Catheter Right Subclavian Double lumen Permanent (Tunneled)  Placement Date/Time: 06/24/22 1644   Serial / Lot #: [id number]  Expiration Date: 09/08/26  Time Out: Correct patient;Correct site;Correct procedure  Maximum sterile barrier precautions: Hand hygiene;Mask;Cap;Sterile gloves;Sterile gown;Large sterile ...  Site Condition No complications  Blue Lumen Status Flushed;Heparin locked;Dead end cap in place  Red Lumen Status Flushed;Heparin locked;Dead end cap in place  Purple Lumen Status N/A  Catheter fill solution Heparin 1000 units/ml  Catheter fill volume (Arterial) 1.9 cc  Catheter fill volume (Venous) 1.9  Dressing  Type Transparent  Dressing Status Antimicrobial disc in place;Clean, Dry, Intact  Drainage Description None  Dressing Change Due 01/05/23  Post treatment catheter status Capped and Clamped      [facility]

## 2022-12-30 NOTE — Progress Notes (Signed)
Inpatient Rehabilitation Admissions Coordinator   Postop rehab consult received. Therapy is recommending SNF. We will sign off.  Ottie Glazier, RN, MSN Rehab Admissions Coordinator [phone] 12/30/2022 2:20 PM

## 2022-12-30 NOTE — Progress Notes (Signed)
  Progress Note    12/30/2022 8:15 AM 1 Day Post-Op  Subjective: No complaints this morning  Vitals:   12/30/22 0356 12/30/22 0502  BP: (!) 114/92 (!) 122/56  Pulse: 70 (!) 104  Resp: 18 16  Temp:  98 F (36.7 C)  SpO2: 100% 100%    Physical Exam: Awake alert and oriented Dressing in place right lower extremity Strong left upper arm thrill  CBC    Component Value Date/Time   WBC 20.6 (H) 12/30/2022 0426   RBC 2.81 (L) 12/30/2022 0426   HGB 8.0 (L) 12/30/2022 0426   HGB 8.8 (L) 10/09/2019 0906   HGB 9.6 (L) 09/03/2019 1620   HCT 26.7 (L) 12/30/2022 0426   HCT 30.4 (L) 09/03/2019 1620   PLT 334 12/30/2022 0426   PLT 348 10/09/2019 0906   PLT 451 (H) 09/03/2019 1620   MCV 95.0 12/30/2022 0426   MCV 85 09/03/2019 1620   MCH 28.5 12/30/2022 0426   MCHC 30.0 12/30/2022 0426   RDW 17.2 (H) 12/30/2022 0426   RDW 13.6 09/03/2019 1620   LYMPHSABS 1.8 12/20/2022 0259   LYMPHSABS 2.2 09/03/2019 1620   MONOABS 0.9 12/20/2022 0259   EOSABS 0.4 12/20/2022 0259   EOSABS 0.3 09/03/2019 1620   BASOSABS 0.1 12/20/2022 0259   BASOSABS 0.1 09/03/2019 1620    BMET    Component Value Date/Time   NA 134 (L) 12/30/2022 0426   NA 137 09/20/2019 1530   K 3.0 (L) 12/30/2022 0426   CL 100 12/30/2022 0426   CO2 25 12/30/2022 0426   GLUCOSE 163 (H) 12/30/2022 0426   BUN 17 12/30/2022 0426   BUN 18 09/20/2019 1530   CREATININE 3.41 (H) 12/30/2022 0426   CALCIUM 7.9 (L) 12/30/2022 0426   GFRNONAA 15 (L) 12/30/2022 0426   GFRAA 49 (L) 11/30/2019 0356    INR    Component Value Date/Time   INR 1.9 (H) 12/15/2022 0157     Intake/Output Summary (Last 24 hours) at 12/30/2022 0815 Last data filed at 12/30/2022 0502 Gross per 24 hour  Intake 500 ml  Output 1200 ml  Net -700 ml     Assessment:  57 y.o. female is s/p right below-knee amputation.  Patient has fistula in the left upper arm and will need revision.  Plan: OR tomorrow for revision left arm AV fistula.  She  will be n.p.o. past midnight we will continue to hold Eliquis.  I have communicated to the primary team.   Luanna Salk. Randie Heinz, MD [facility] Office: [phone] Pager: [phone]  12/30/2022 8:15 AM

## 2022-12-30 NOTE — Progress Notes (Signed)
Deborah [name]  WUJ:[id number] DOB: [birth year] DOA: 12/14/2022 PCP: Pcp, No    Brief Narrative:  57 year old with a history of ESRD on HD MWF, PAF on Eliquis, diastolic CHF, HTN, DM2, malignant duodenal carcinoid, NAFLD, morbid obesity, and a chronic right ankle ulcer who had recently been admitted 9/16-9/24/2024 at which time she underwent an angiogram noting adequate perfusion to her right foot with ultimate disposition being treatment for gout of the right ankle.  She returned to the ER 10/8 and was found to have a hemoglobin of 5.8 with crampy abdominal pain and a history of BRBPR.  Since the time of her admission she has experienced recurrent GI bleeding.  Ultimately she was taken for an EGD and capsule endoscopy.  Additionally she has been confirmed to have osteomyelitis of the right ankle with plans for right BKA 10/23.  Goals of Care:   Code Status: Full Code   DVT prophylaxis: SCD's Start: 12/29/22 2334 SCDs Start: 12/14/22 2037   Interim Hx: No acute issues reported overnight.  At the time my exam she reports ongoing severe pain in the right lower extremity.  She denies chest pain shortness of breath or fever.  She also reports unrelenting nausea.  Assessment & Plan:  Acute anemia due to GI bleeding/blood loss on chronic anemia of ESRD Has required 4 units PRBC total during this hospital stay -EGD noted minimal reflux esophagitis with normal-appearing duodenum and 3 small AVMs with 1 having an adherent clot -some degree of rebleeding expected if she is to resume anticoagulation -not a candidate for long-term Carafate due to ESRD (will need to stop after 2 weeks max) -outpatient colonoscopy is recommended  Recent Labs  Lab 12/25/22 0227 12/27/22 0921 12/29/22 0410 12/29/22 1434 12/30/22 0426  HGB 7.0* 7.6* 6.4* 8.8* 8.0*     ESRD on HD MWF Ongoing care per Nephrology team -will need superficialization of her L brachiocephalic AV fistula during this hospital stay  -Vascular Surgery plans to take her to the OR tomorrow  Chronic paroxysmal atrial fibrillation previously on DOAC Eliquis on hold -may not be able to resume given ongoing GI bleeding and known AVMs -rate relatively well-controlled with some room for improvement at present  Chronic right heel wound with acutely diagnosed osteomyelitis No evidence of osteomyelitis on MRI last month -repeat MRI 10/14 now consistent with early osteomyelitis -Dr. Lajoyce Corners performed BKA 10/23 -postop care has been uncomplicated thus far  Acute toxic encephalopathy due to opiate medication MRI brain unrevealing -care with narcotic dosing  DM2 CBG within goal at present  Duodenal carcinoid Status post resection  Chronic diastolic CHF Volume management per HD  Chronic back pain -lumbar spinal stenosis with neurogenic claudication Chronically on Dilaudid at home -reportedly wheelchair-bound for several months -MRI lumbar spine earlier this year noted lumbar spondylosis worsening since 2019 -evaluated by Neurosurgery March 2024 and not felt to be appropriate for surgical approach with suggestion to continue physical therapy - will need SNF at time of discharge  Morbid obesity with NAFLD - Body mass index is 45.19 kg/m.  Mood disorder Continue usual duloxetine -diazepam added this admission  Gout Continue usual allopurinol   Family Communication: Spoke with "best friend" at bedside with the patient's blessing Disposition: Anticipate need for SNF rehab stay   Objective: Blood pressure (!) 122/56, pulse (!) 104, temperature 98 F (36.7 C), temperature source Oral, resp. rate 16, height 5\' 6"  (1.676 m), weight 127 kg, last menstrual period 10/10/2012, SpO2 100%.  Intake/Output Summary (Last 24  hours) at 12/30/2022 0918 Last data filed at 12/30/2022 0502 Gross per 24 hour  Intake 500 ml  Output 1200 ml  Net -700 ml   Filed Weights   12/26/22 0500 12/28/22 0432 12/29/22 0500  Weight: 124.1 kg 123.8 kg  127 kg    Examination: General: No acute respiratory distress Lungs: Clear to auscultation bilaterally-no wheezing Cardiovascular: RRR without murmur Abdomen: Nontender, nondistended, soft, bowel sounds positive, no rebound, no ascites, no appreciable mass Extremities: No significant edema of the extremities  CBC: Recent Labs  Lab 12/27/22 0921 12/29/22 0410 12/29/22 1434 12/30/22 0426  WBC 22.8* 18.1*  --  20.6*  HGB 7.6* 6.4* 8.8* 8.0*  HCT 25.5* 22.2* 26.0* 26.7*  MCV 96.2 96.5  --  95.0  PLT 342 321  --  334   Basic Metabolic Panel: Recent Labs  Lab 12/24/22 0712 12/25/22 0227 12/27/22 0921 12/28/22 1259 12/29/22 1434 12/30/22 0426  NA 134*   < > 136 137 138 134*  K 3.2*   < > 2.7* 3.1* 3.4* 3.0*  CL 97*   < > 105 100 99 100  CO2 23   < > 23 27  --  25  GLUCOSE 107*   < > 115* 169* 79 163*  BUN 18   < > 27* 17 31* 17  CREATININE 4.19*   < > 4.70* 3.48* 4.30* 3.41*  CALCIUM 8.6*   < > 7.9* 8.1*  --  7.9*  PHOS 3.3  --   --  3.2  --  3.2   < > = values in this interval not displayed.   GFR: Estimated Creatinine Clearance: 24.5 mL/min (A) (by C-G formula based on SCr of 3.41 mg/dL (H)).   Scheduled Meds:  (feeding supplement) PROSource Plus  30 mL Oral BID BM   acidophilus  2 capsule Oral Daily   allopurinol  100 mg Oral BID   vitamin C  1,000 mg Oral Daily   atorvastatin  10 mg Oral QHS   busPIRone  10 mg Oral BID   darbepoetin (ARANESP) injection - DIALYSIS  200 mcg Subcutaneous Q Fri-1800   docusate sodium  100 mg Oral Daily   doxercalciferol  7 mcg Intravenous Q M,W,F-HD   DULoxetine  40 mg Oral Daily   feeding supplement  296 mL Oral Once   feeding supplement (NEPRO CARB STEADY)  237 mL Oral BID BM   ferric citrate  420 mg Oral TID WC   folic acid  1 mg Oral Daily   Gerhardt's butt cream   Topical BID   insulin aspart  0-6 Units Subcutaneous TID WC   insulin aspart  2 Units Subcutaneous TID WC   insulin glargine-yfgn  8 Units Subcutaneous QHS    leptospermum manuka honey  1 Application Topical Daily   midodrine  5 mg Oral TID WC   mirtazapine  7.5 mg Oral QHS   mupirocin ointment  1 Application Nasal BID   nutrition supplement (JUVEN)  1 packet Oral BID BM   pantoprazole  40 mg Oral Daily   sucralfate  1 g Oral TID WC & HS   zinc sulfate  220 mg Oral Daily      LOS: 16 days   Lonia Blood, MD [facility] Office  [phone] Pager - Text Page per Loretha Stapler  If 7PM-7AM, please contact night-coverage per Amion 12/30/2022, 9:18 AM

## 2022-12-30 NOTE — Progress Notes (Signed)
Patient ID: Deborah [name], female   DOB: Dec 10, 1965, 57 y.o.   MRN: [id number] Patient is postoperative day 1 transtibial amputation.  There is no drainage of the wound VAC canister there is a good suction fit.  Discharge planning based on therapy recommendations.

## 2022-12-30 NOTE — TOC Progression Note (Signed)
Transition of Care [facility]) - Progression Note    Patient Details  Name: Deborah [name] MRN: [id number] Date of Birth: [birth year]  Transition of Care [facility]) CM/SW Contact  Erin Sons, [place] Phone Number: 12/30/2022, 2:47 PM  Clinical Narrative:     CSW met with pt and pt's mother bedside. Pt lives at home with her husband. Discussed SNF rec. Pt agreeable to SNF workup. CSW explained medicare coverage and authorization process. Pt prefers facility in [place] area. Fl2 completed and bed requests sent in hub.   Social Determinants of Health (SDOH) Interventions SDOH Screenings   Food Insecurity: No Food Insecurity (12/15/2022)  Housing: Patient Declined (12/15/2022)  Transportation Needs: No Transportation Needs (12/15/2022)  Utilities: Not At Risk (12/15/2022)  Depression (PHQ2-9): High Risk (11/18/2022)  Financial Resource Strain: Low Risk (03/23/2021)   [facility]  Physical Activity: Not on File (10/31/2017)   Received from [place], [place]  Social Connections: Unknown (07/19/2021)   [facility]  Stress: Low Risk (03/23/2021)   [facility] from [facility])  Tobacco Use: Low Risk  (12/29/2022)    Readmission Risk Interventions    11/25/2022    5:16 PM 07/05/2022    1:20 PM  Readmission Risk Prevention Plan  Transportation Screening Complete Complete  Medication Review (RN Care Manager) Complete Referral to Pharmacy  PCP or Specialist appointment within 3-5 days of discharge Complete Complete  HRI or Home Care Consult Complete Complete  SW Recovery Care/Counseling Consult Complete Complete  Palliative Care Screening Not Applicable Not Applicable  Skilled Nursing Facility Not Applicable Not Applicable

## 2022-12-30 NOTE — Progress Notes (Signed)
[facility] Dr Laban Emperor informed that patient pain management is dilaudid q3 hrs requested IV Robaxin due to patient not liking pills.

## 2022-12-30 NOTE — Progress Notes (Signed)
Tried to give meds paient said not now wants to sleep

## 2022-12-30 NOTE — Progress Notes (Signed)
Pt goes to [facility] on MWF 12:15 pm chair time. This info provided to CSW for d/c planning purposes. Will assist as needed.   Olivia Canter Renal Navigator [phone]

## 2022-12-30 NOTE — Progress Notes (Signed)
Subjective: Seen in room in bed, status post right BKA yest.  Also HD.  Pain appears to be controlled requires meds.  Noted for tomorrow AV fistula revision  Objective Vital signs in last 24 hours: Vitals:   12/30/22 0330 12/30/22 0356 12/30/22 0502 12/30/22 0950  BP: (!) 151/57 (!) 114/92 (!) 122/56 (!) 130/59  Pulse: (!) 111 70 (!) 104 (!) 101  Resp: 19 18 16 18   Temp: 98.1 F (36.7 C)  98 F (36.7 C) 97.8 F (36.6 C)  TempSrc: Axillary  Oral   SpO2: 99% 100% 100% 93%  Weight:      Height:       Weight change:   Physical Exam: General: Chronically ill obese female, NAD Heart: RRR no MRG Lungs: CTA bilaterally nonlabored breathing Abdomen: Obese NABS soft NTND Extremities: Right BKA dressing dry clear , trace left pedal edema Dialysis Access: Right IJ TDC/LUA AVF  + bruit appears deep   Dialysis Orders: MWF [place]   F180   400/800   3K/2.5 bath  126.9kg   [facility]  /left upper arm AV fistula placed April 2024 - mircera 225 mcg last 10/5 - hectorol 7 mcg   Problem/Plan: ESRD: Back to usual MWF schedule - Hypokalemia= 3.0 this a.m. we will replenish with supplement p.o. use 4K bath Right foot osteomyelitis/abscess status post right BKA 12/2322 Dr. Lajoyce Corners ABX  per Ortho/admit team BP/volume: BP stable, edema on exam. Continue home meds, UF as tolerated. Below prior EDW - lower on discharge especially after amputation Anemia of acute GI bleed on Chronic Anemia of ESRD: Hgb 8.8, yesterday 6.42 units packed RBCs ,Aranesp given 10/19, s/p EGD Multiple gastric AVMs, treated with APC and capsule endo 10/17 w/ no acute bleeding but polyps.  Rec colonoscopy. Eliquis on hold.   GI resumed Reglan.  Secondary Hyperparathyroidism/Hyperphosphatemia: CorrCa/Phos good, continue meds. Vascular access - TDC in place. Not working well last week HD requiring cath flow dwell. If continues to be an issue may need to have it exchanged./LUA AV fistula inserted 06/2022, VVS eval and for revision  tomorrow  AMS/acute toxic encephalopathy= has resolved was due to opiate medication MRI brain unrevealing, careful dosing her with narcotics  Lenny Pastel, PA-C [facility] [phone] 12/30/2022,11:30 AM  LOS: 16 days   Labs: Basic Metabolic Panel: Recent Labs  Lab 12/24/22 0712 12/25/22 0227 12/27/22 0921 12/28/22 1259 12/29/22 1434 12/30/22 0426  NA 134*   < > 136 137 138 134*  K 3.2*   < > 2.7* 3.1* 3.4* 3.0*  CL 97*   < > 105 100 99 100  CO2 23   < > 23 27  --  25  GLUCOSE 107*   < > 115* 169* 79 163*  BUN 18   < > 27* 17 31* 17  CREATININE 4.19*   < > 4.70* 3.48* 4.30* 3.41*  CALCIUM 8.6*   < > 7.9* 8.1*  --  7.9*  PHOS 3.3  --   --  3.2  --  3.2   < > = values in this interval not displayed.   Liver Function Tests: Recent Labs  Lab 12/28/22 1259 12/30/22 0426  ALBUMIN 1.8* 1.9*   No results for input(s): "LIPASE", "AMYLASE" in the last 168 hours. No results for input(s): "AMMONIA" in the last 168 hours. CBC: Recent Labs  Lab 12/24/22 0712 12/25/22 0227 12/27/22 0921 12/29/22 0410 12/29/22 1434 12/30/22 0426  WBC 22.0* 20.2* 22.8* 18.1*  --  20.6*  HGB  7.6* 7.0* 7.6* 6.4* 8.8* 8.0*  HCT 25.4* 23.7* 25.5* 22.2* 26.0* 26.7*  MCV 95.8 96.0 96.2 96.5  --  95.0  PLT 345 243 342 321  --  334   Cardiac Enzymes: No results for input(s): "CKTOTAL", "CKMB", "CKMBINDEX", "TROPONINI" in the last 168 hours. CBG: Recent Labs  Lab 12/29/22 1102 12/29/22 1627 12/29/22 1745 12/29/22 2056 12/30/22 0730  GLUCAP 84 105* 89 70 128*    Studies/Results: VAS US DUPLEX DIALYSIS ACCESS (AVF, AVG)  Result Date: 12/29/2022 DIALYSIS ACCESS Patient Name:  Deborah [name]  Date of Exam:   12/28/2022 Medical Rec #: [id number]             Accession #:    [id number] Date of Birth: 1965-06-01             Patient Gender: F Patient Age:   57 years Exam Location:  [facility] Procedure:      VAS US DUPLEX DIALYSIS ACCESS (AVF, AVG) Referring Phys:  [facility] --------------------------------------------------------------------------------  Reason for Exam: Non-maturation of AVF. Access Site: Left Upper Extremity. Access Type: 06/30/22. History: HTN, DM-2, malignant carcinoid, NAFLD, morbid obesity, AFIB. Performing Technologist: Marilynne Halsted RDMS, RVT  Examination Guidelines: A complete evaluation includes B-mode imaging, spectral Doppler, color Doppler, and power Doppler as needed of all accessible portions of each vessel. Unilateral testing is considered an integral part of a complete examination. Limited examinations for reoccurring indications may be performed as noted.  Findings: +--------------------+----------+-----------------+--------+ AVF                 PSV (cm/s)Flow Vol (mL/min)Comments +--------------------+----------+-----------------+--------+ Native artery inflow   216           634                +--------------------+----------+-----------------+--------+ AVF Anastomosis        470                              +--------------------+----------+-----------------+--------+  +------------+----------+-------------+----------+-----------------------------+ OUTFLOW VEINPSV (cm/s)Diameter (cm)Depth (cm)          Describe            +------------+----------+-------------+----------+-----------------------------+ Shoulder       219        0.47        1.85                                 +------------+----------+-------------+----------+-----------------------------+ Prox UA        131        0.51        1.61                                 +------------+----------+-------------+----------+-----------------------------+ Mid UA         448        0.53        0.95    change in Diameter and 0.48                                                         down 0.29           +------------+----------+-------------+----------+-----------------------------+ Dist UA  382        0.81        0.22                                  +------------+----------+-------------+----------+-----------------------------+ AC Fossa       560        0.77        0.15                                 +------------+----------+-------------+----------+-----------------------------+   Summary: Patent left AVF with a change in diameter in the mid upper arm.  *See table(s) above for measurements and observations.  Diagnosing physician: Coral Else MD Electronically signed by Coral Else MD on 12/29/2022 at 6:21:27 PM.   --------------------------------------------------------------------------------   Final    Medications:   ceFAZolin (ANCEF) IV      (feeding supplement) PROSource Plus  30 mL Oral BID BM   acidophilus  2 capsule Oral Daily   allopurinol  100 mg Oral BID   vitamin C  1,000 mg Oral Daily   atorvastatin  10 mg Oral QHS   busPIRone  10 mg Oral BID   darbepoetin (ARANESP) injection - DIALYSIS  200 mcg Subcutaneous Q Fri-1800   docusate sodium  100 mg Oral Daily   doxercalciferol  7 mcg Intravenous Q M,W,F-HD   DULoxetine  40 mg Oral Daily   feeding supplement (NEPRO CARB STEADY)  237 mL Oral BID BM   ferric citrate  420 mg Oral TID WC   folic acid  1 mg Oral Daily   Gerhardt's butt cream   Topical BID   insulin aspart  0-6 Units Subcutaneous TID WC   insulin aspart  2 Units Subcutaneous TID WC   insulin glargine-yfgn  8 Units Subcutaneous QHS   midodrine  5 mg Oral TID WC   mirtazapine  7.5 mg Oral QHS   mupirocin ointment  1 Application Nasal BID   nutrition supplement (JUVEN)  1 packet Oral BID BM   pantoprazole  40 mg Oral Daily   sucralfate  1 g Oral TID WC & HS   zinc sulfate  220 mg Oral Daily

## 2022-12-30 NOTE — [place] FL2 (Addendum)
[place] MEDICAID FL2 LEVEL OF CARE FORM     IDENTIFICATION  Patient Name: Deborah [name] Birthdate: 1965/09/28 Sex: female Admission Date (Current Location): 12/14/2022  [facility] and [place] Number:  Producer, television/film/video and Address:  [facility], [address], [place], [place] [address]      Provider Number: [id number]  Attending Physician Name and Address:  Lonia Blood, MD  Relative Name and Phone Number:  Simiya, Music (Mother)  [phone] (Home Phone)    Current Level of Care: Hospital Recommended Level of Care: Skilled Nursing Facility Prior Approval Number:    Date Approved/Denied:   PASRR Number: [id number]  Discharge Plan: SNF    Current Diagnoses: Patient Active Problem List   Diagnosis Date Noted   Peutz-Jeghers polyps of small bowel (HCC) 12/24/2022   Gastric AVM 12/23/2022   Acute osteomyelitis of right calcaneus (HCC) 12/21/2022   Acute on chronic anemia 12/20/2022   Acute diarrhea 12/16/2022   Hematochezia 12/16/2022   Symptomatic anemia 12/14/2022   Pressure injury of skin 11/30/2022   Non-pressure chronic ulcer of right heel and midfoot limited to breakdown of skin (HCC) 11/24/2022   Sepsis (HCC) 11/23/2022   Elevated troponin 09/09/2022   Nausea & vomiting 09/09/2022   End stage renal disease on dialysis (HCC) 09/09/2022   Chronic a-fib (HCC) 09/09/2022   Atrial fibrillation with RVR (HCC)-resolved 09/09/2022   Peripheral neuropathy 09/09/2022   (HFpEF) heart failure with preserved ejection fraction (HCC) 09/09/2022   Hypotension 09/09/2022   CHF (congestive heart failure) (HCC) 07/13/2022   HCAP (healthcare-associated pneumonia) 06/19/2022   Acute encephalopathy 05/29/2022   Hyponatremia 05/29/2022   Chronic anemia 05/22/2022   Hyperosmolar hyperglycemic state (HHS) (HCC) 05/11/2022   Hyperosmolar non-ketotic state due to type 2 diabetes mellitus (HCC) 05/11/2022   Leukocytosis 05/11/2022   Edema  of left upper extremity 03/21/2022   Tachycardia 03/18/2022   Atypical chest pain 09/10/2021   Acute pyelonephritis    PAF (paroxysmal atrial fibrillation) (HCC)    Chronic pain 09/04/2019   Malignant carcinoid tumor of duodenum (HCC)    Nonalcoholic fatty liver disease 06/05/2019   Chronic diarrhea    Restrictive lung disease secondary to obesity    History of gastric ulcer    Fibroid uterus 02/23/2019   Abdominal pain 07/17/2018   Anxiety 11/29/2017   Spinal stenosis, lumbar region with neurogenic claudication 08/03/2017   GERD (gastroesophageal reflux disease) 03/19/2017   Depression 03/19/2017   Morbid obesity (HCC)    Normocytic anemia 08/16/2016   Diabetic gastroparesis (HCC) 08/16/2016   Gout 06/05/2016   Chronic hypokalemia 09/26/2015   Hypomagnesemia and hypokalemia 09/26/2015   Insulin dependent type 2 diabetes mellitus (HCC) 05/25/2015   Essential hypertension 09/28/2013    Orientation RESPIRATION BLADDER Height & Weight     Self, Time, Situation, Place  Normal Continent Weight: 279 lb 15.8 oz (127 kg) Height:  5\' 6"  (167.6 cm)  BEHAVIORAL SYMPTOMS/MOOD NEUROLOGICAL BOWEL NUTRITION STATUS      Continent Diet (see d/c summmary)  AMBULATORY STATUS COMMUNICATION OF NEEDS Skin   Extensive Assist   PU Stage and Appropriate Care (pressure injury left ischial tuberosity unstageable; pressure injury right heel unstageable; pressure injury right thigh stage 2)                       Personal Care Assistance Level of Assistance  Bathing, Feeding, Dressing Bathing Assistance: Limited assistance Feeding assistance: Independent Dressing Assistance: Limited assistance  Functional Limitations Info  Sight, Hearing, Speech Sight Info: Adequate Hearing Info: Adequate Speech Info: Adequate    SPECIAL CARE FACTORS FREQUENCY  OT (By licensed OT), PT (By licensed PT)     PT Frequency: 5x/week OT Frequency: 5x/week            Contractures Contractures Info:  Not present    Additional Factors Info  Code Status, Allergies Code Status Info: full code Allergies Info: Isovue (iopamidol), neurontin(gabapentin), nsaids, penicillins, Reglan (metoclopramide), Zestril (lisinopril), valium(diazepam), Tolectin (tolmetin), Asa(Aspirin), Aspartame And Phenylalanine, oxycontin(oxycodone), Bentyl (dicyclomine), flexiril(cyclobenzaprine), Hibiclens (chlorhexidine Gluconate), ultram(tramadol), Rifamycins, Tylenol (acetaminophen)           Current Medications (12/30/2022):  This is the current hospital active medication list Current Facility-Administered Medications  Medication Dose Route Frequency Provider Last Rate Last Admin   (feeding supplement) PROSource Plus liquid 30 mL  30 mL Oral BID BM Nadara Mustard, MD   30 mL at 12/26/22 0905   acetaminophen (TYLENOL) tablet 1,000 mg  1,000 mg Oral Q6H PRN Nadara Mustard, MD       acidophilus (RISAQUAD) capsule 2 capsule  2 capsule Oral Daily Nadara Mustard, MD   2 capsule at 12/30/22 1006   allopurinol (ZYLOPRIM) tablet 100 mg  100 mg Oral BID Nadara Mustard, MD   100 mg at 12/30/22 1005   alum & mag hydroxide-simeth (MAALOX/MYLANTA) 200-200-20 MG/5ML suspension 15-30 mL  15-30 mL Oral Q2H PRN Nadara Mustard, MD       ascorbic acid (VITAMIN C) tablet 1,000 mg  1,000 mg Oral Daily Nadara Mustard, MD   1,000 mg at 12/30/22 1007   atorvastatin (LIPITOR) tablet 10 mg  10 mg Oral QHS Nadara Mustard, MD   10 mg at 12/29/22 2118   bisacodyl (DULCOLAX) EC tablet 5 mg  5 mg Oral Daily PRN Nadara Mustard, MD       busPIRone (BUSPAR) tablet 10 mg  10 mg Oral BID Nadara Mustard, MD   10 mg at 12/30/22 1006   ceFAZolin (ANCEF) IVPB 2g/100 mL premix  2 g Intravenous Q8H Nadara Mustard, MD 200 mL/hr at 12/30/22 1152 2 g at 12/30/22 1152   Darbepoetin Alfa (ARANESP) injection 200 mcg  200 mcg Subcutaneous Q Fri-1800 Nadara Mustard, MD   200 mcg at 12/24/22 1820   docusate sodium (COLACE) capsule 100 mg  100 mg Oral Daily Nadara Mustard, MD   100 mg at 12/30/22 1007   doxercalciferol (HECTOROL) injection 7 mcg  7 mcg Intravenous Q M,W,F-HD Nadara Mustard, MD   7 mcg at 12/30/22 0326   DULoxetine (CYMBALTA) DR capsule 40 mg  40 mg Oral Daily Nadara Mustard, MD   40 mg at 12/30/22 1006   feeding supplement (NEPRO CARB STEADY) liquid 237 mL  237 mL Oral BID BM Nadara Mustard, MD       ferric citrate (AURYXIA) tablet 420 mg  420 mg Oral TID WC Nadara Mustard, MD   420 mg at 12/30/22 1007   folic acid (FOLVITE) tablet 1 mg  1 mg Oral Daily Nadara Mustard, MD   1 mg at 12/30/22 1006   Gerhardt's butt cream   Topical BID Nadara Mustard, MD   Given at 12/30/22 1156   guaiFENesin-dextromethorphan (ROBITUSSIN DM) 100-10 MG/5ML syrup 15 mL  15 mL Oral Q4H PRN Nadara Mustard, MD       haloperidol lactate (HALDOL) injection 2 mg  2 mg Intravenous Q6H PRN Nadara Mustard, MD   2 mg at 12/29/22 0039   hydrALAZINE (APRESOLINE) injection 5 mg  5 mg Intravenous Q20 Min PRN Nadara Mustard, MD       HYDROmorphone (DILAUDID) injection 1-2 mg  1-2 mg Intravenous Q3H PRN Lonia Blood, MD   2 mg at 12/30/22 1141   HYDROmorphone (DILAUDID) tablet 4 mg  4 mg Oral Q8H PRN Nadara Mustard, MD   4 mg at 12/28/22 1119   insulin aspart (novoLOG) injection 0-6 Units  0-6 Units Subcutaneous TID WC Nadara Mustard, MD   3 Units at 12/28/22 1813   insulin aspart (novoLOG) injection 2 Units  2 Units Subcutaneous TID WC Nadara Mustard, MD   2 Units at 12/29/22 1213   insulin glargine-yfgn (SEMGLEE) injection 8 Units  8 Units Subcutaneous QHS Nadara Mustard, MD   8 Units at 12/29/22 2200   labetalol (NORMODYNE) injection 10 mg  10 mg Intravenous Q10 min PRN Nadara Mustard, MD       loperamide (IMODIUM) capsule 2 mg  2 mg Oral PRN Nadara Mustard, MD   2 mg at 12/28/22 1759   metoprolol tartrate (LOPRESSOR) injection 2-5 mg  2-5 mg Intravenous Q2H PRN Nadara Mustard, MD       midodrine (PROAMATINE) tablet 5 mg  5 mg Oral TID WC Nadara Mustard, MD   5 mg at  12/30/22 1007   mirtazapine (REMERON) tablet 7.5 mg  7.5 mg Oral QHS Nadara Mustard, MD   7.5 mg at 12/29/22 2119   mupirocin ointment (BACTROBAN) 2 % 1 Application  1 Application Nasal BID Nadara Mustard, MD   1 Application at 12/30/22 1153   nutrition supplement (JUVEN) (JUVEN) powder packet 1 packet  1 packet Oral BID BM Nadara Mustard, MD       [facility]) injection 4 mg  4 mg Intravenous Q6H PRN Nadara Mustard, MD   4 mg at 12/26/22 1736   pantoprazole (PROTONIX) EC tablet 40 mg  40 mg Oral Daily Nadara Mustard, MD   40 mg at 12/30/22 1005   phenol (CHLORASEPTIC) mouth spray 1 spray  1 spray Mouth/Throat PRN Nadara Mustard, MD       polyethylene glycol (MIRALAX / GLYCOLAX) packet 17 g  17 g Oral Daily PRN Nadara Mustard, MD       potassium chloride SA (KLOR-CON M) CR tablet 20 mEq  20 mEq Oral BID Lenny Pastel, PA-C   20 mEq at 12/30/22 1201   sucralfate (CARAFATE) 1 GM/10ML suspension 1 g  1 g Oral TID WC & HS Nadara Mustard, MD   1 g at 12/30/22 1012   zinc sulfate capsule 220 mg  220 mg Oral Daily Nadara Mustard, MD   220 mg at 12/30/22 1007     Discharge Medications: Please see discharge summary for a list of discharge medications.  Relevant Imaging Results:  Relevant Lab Results:   Additional Information [id number] [facility] MWF 12:15 chair time.   Hicks Feick Aris Lot, LCSW

## 2022-12-30 NOTE — Evaluation (Signed)
Physical Therapy Re-Evaluation Patient Details Name: Deborah [name] MRN: [id number] DOB: [birth year] Today's Date: 12/30/2022  History of Present Illness  Pt is a 57 y/o F admitted on 12/14/22 after presenting with c/o abdominal pain. Pt found to have Hgb of 5.8. Pt is being treated for acute anemia 2/2 GI bleed/blood loss on chronic anemia in setting of ESRD. While admitted pt found to have R ankle osteomyelitis. Pt is s/p R BKA on 12/29/22. PMH: ESRD on HD MWF, PAF on Eliquis, diastolic CHF, HTN, DM2, malignant duodenal carcinoid, NAFLD, morbid obesity, chronic R ankle ulcer  Clinical Impression  Pt seen for PT re-evaluation with pt received in bed & agreeable.  PT educated pt on phantom limb pain/sensation and desensitization techniques and positioning, as well as importance of participating with PT. Pt declines sitting EOB but is able to scoot to [facility] with bed flat, use of bed rails. Pt does participate in RLE exercises with AAROM. Pt reports she will attempt sitting EOB during next session. Will continue to follow pt acutely to progress mobility as able.    If plan is discharge home, recommend the following: Two people to help with walking and/or transfers;A lot of help with bathing/dressing/bathroom;Assist for transportation;Supervision due to cognitive status;Help with stairs or ramp for entrance;Direct supervision/assist for medications management;Direct supervision/assist for financial management   Can travel by private vehicle   No    Equipment Recommendations Other (comment) (defer to next venue)  Recommendations for Other Services       Functional Status Assessment Patient has had a recent decline in their functional status and demonstrates the ability to make significant improvements in function in a reasonable and predictable amount of time.     Precautions / Restrictions Precautions Precautions: Fall Precaution Comments: sacral wounds, bilateral heel wounds, R BKA  12/29/22, R BKA limb guard Restrictions Weight Bearing Restrictions: Yes RLE Weight Bearing: Non weight bearing      Mobility  Bed Mobility               General bed mobility comments: Pt scoots to [facility] with bed flat, use of bed rails.    Transfers                        Ambulation/Gait                  Stairs            Wheelchair Mobility     Tilt Bed    Modified Rankin (Stroke Patients Only)       Balance                                             Pertinent Vitals/Pain Pain Assessment Pain Assessment: Faces Faces Pain Scale: Hurts whole lot Pain Location: B residual limb, stomach Pain Descriptors / Indicators: Discomfort Pain Intervention(s): Monitored during session    Home Living Family/patient expects to be discharged to:: Private residence Living Arrangements: Spouse/significant other Available Help at Discharge: Personal care attendant;Available PRN/intermittently Type of Home: Apartment Home Access: Stairs to enter Entrance Stairs-Rails: Can reach both Entrance Stairs-Number of Steps: 1   Home Layout: One level Home Equipment: BSC/3in1;Shower seat;Wheelchair - manual Additional Comments: patient has PCA 6 days a week 3 hours a day. all information from last admission, pt with AMS and unable to state on  this evaluation    Prior Function Prior Level of Function : Needs assist             Mobility Comments: pivoting to wheelchair; propelling w/c; states husband bumps her up/down (in wheelchair) the single step at entry ADLs Comments: Has assist from aides for bathing and dressing     Extremity/Trunk Assessment   Upper Extremity Assessment Upper Extremity Assessment: Generalized weakness LUE Deficits / Details: L shoulder stiffness, difficulty to assess due to anxiety with moving. LUE: Shoulder pain with ROM LUE Sensation: WNL LUE Coordination: decreased gross motor    Lower Extremity  Assessment Lower Extremity Assessment: RLE deficits/detail (RLE in limb guard)       Communication   Communication Communication: No apparent difficulties  Cognition Arousal: Lethargic Behavior During Therapy: WFL for tasks assessed/performed Overall Cognitive Status: Within Functional Limits for tasks assessed                                          General Comments      Exercises General Exercises - Lower Extremity Hip ABduction/ADduction: AAROM, Supine, AROM, Strengthening, Both, 10 reps (hip abduction slides x 10, hip adduction pillow squeezes x 10) Straight Leg Raises: AAROM, Supine, Strengthening, Right, 10 reps Other Exercises Other Exercises: PT educated pt on phantom limb sensation & phantom limb pain, as well as desensitization techniques. Also educated pt on need to promote knee extension to prevent flexion contractures.   Assessment/Plan    PT Assessment Patient needs continued PT services  PT Problem List Decreased strength;Pain;Decreased range of motion;Decreased activity tolerance;Decreased balance;Decreased mobility;Decreased knowledge of precautions;Decreased safety awareness;Decreased knowledge of use of DME;Impaired sensation;Decreased skin integrity       PT Treatment Interventions DME instruction;Balance training;Gait training;Modalities;Neuromuscular re-education;Functional mobility training;Patient/family education;Therapeutic activities;Wheelchair mobility training;Therapeutic exercise;Manual techniques    PT Goals (Current goals can be found in the Care Plan section)  Acute Rehab PT Goals Patient Stated Goal: decreased pain PT Goal Formulation: With patient Time For Goal Achievement: 01/13/23 Potential to Achieve Goals: Fair Additional Goals Additional Goal #1: Pt will propel w/c 150 ft with supervision for increased independence with mobility.    Frequency Min 1X/week     Co-evaluation               AM-PAC PT "6  Clicks" Mobility  Outcome Measure Help needed turning from your back to your side while in a flat bed without using bedrails?: A Lot Help needed moving from lying on your back to sitting on the side of a flat bed without using bedrails?: A Lot Help needed moving to and from a bed to a chair (including a wheelchair)?: Total Help needed standing up from a chair using your arms (e.g., wheelchair or bedside chair)?: Total Help needed to walk in hospital room?: Total Help needed climbing 3-5 steps with a railing? : Total 6 Click Score: 8    End of Session   Activity Tolerance: Patient limited by fatigue;Patient limited by pain;Patient limited by lethargy Patient left: in bed;with call bell/phone within reach;with bed alarm set;with family/visitor present Nurse Communication: Mobility status (c/o pain) PT Visit Diagnosis: Muscle weakness (generalized) (M62.81);Difficulty in walking, not elsewhere classified (R26.2);Pain;Other abnormalities of gait and mobility (R26.89) Pain - Right/Left: Right Pain - part of body: Leg (abdomen)    Time: 1914-7829 PT Time Calculation (min) (ACUTE ONLY): 14 min   Charges:   PT  Evaluation $PT Re-evaluation: 1 Re-eval   PT General Charges $$ ACUTE PT VISIT: 1 Visit         Aleda Grana, PT, DPT 12/30/22, 1:55 PM   [name] 12/30/2022, 1:53 PM

## 2022-12-30 NOTE — Evaluation (Signed)
Occupational Therapy Evaluation Patient Details Name: Deborah [name] MRN: [id number] DOB: [birth year] Today's Date: 12/30/2022   History of Present Illness The pt is a 57 yo female s/p R BKA 10/23, presenting 10/8 with weakness, diarrhea x4 days, nausea, vomiting, and AMS over last month. Work up revealed Hgb of 5.8, undergoing continued work up for GIB. PMH includes: prior cancer of duodenum, ESRD, GERD, obesity, diabetes, and chronic sacral wounds.   Clinical Impression   Pt c/o 10/10 pain, pain meds given during session. Pt mother present during session, RN and MD stepped in during session to discuss POC. Pt at baseline requires daily assistance with ADLs/mobility at home. Pt currently requires total A for LB ADLs, refused assist to EOB due to severity of pain, able to roll L/R min A. Pt assisted with scooting back in bed and setting up meal, Pt had difficulty completing BL feeding due to L shoulder pain/stiffness. Pt would benefit from postacute rehab <3hrs/day to improve with functional independence with ADLs/mobility, will be seen acutely to progress as able.        If plan is discharge home, recommend the following: Two people to help with walking and/or transfers;Two people to help with bathing/dressing/bathroom;Assistance with cooking/housework;Help with stairs or ramp for entrance;Assist for transportation    Functional Status Assessment  Patient has had a recent decline in their functional status and demonstrates the ability to make significant improvements in function in a reasonable and predictable amount of time.  Equipment Recommendations  Other (comment) (defer)    Recommendations for Other Services       Precautions / Restrictions Precautions Precautions: Fall Precaution Comments: sacral wounds, bilateral heel wounds Restrictions Weight Bearing Restrictions: Yes RLE Weight Bearing: Non weight bearing      Mobility Bed Mobility Overal bed mobility: Needs  Assistance   Rolling: Min assist, Used rails         General bed mobility comments: max A for scooting back in bed, declined assist to EOB    Transfers Overall transfer level: Needs assistance                 General transfer comment: declined      Balance Overall balance assessment: Needs assistance     Sitting balance - Comments: declined sitting due to RLE pain       Standing balance comment: declined to attempt                           ADL either performed or assessed with clinical judgement   ADL Overall ADL's : Needs assistance/impaired Eating/Feeding: Set up;Bed level   Grooming: Minimal assistance;Bed level   Upper Body Bathing: Maximal assistance;Bed level   Lower Body Bathing: Total assistance;Bed level   Upper Body Dressing : Maximal assistance;Bed level   Lower Body Dressing: Bed level;Total assistance       Toileting- Clothing Manipulation and Hygiene: Total assistance;Bed level         General ADL Comments: total A for LB ADLs, at bed level at this time, difficulty with BL tasks, Pt's L shoudler is hurting     Vision Baseline Vision/History: 0 No visual deficits Ability to See in Adequate Light: 0 Adequate Patient Visual Report: No change from baseline       Perception         Praxis         Pertinent Vitals/Pain Pain Assessment Pain Assessment: 0-10 Pain Score: 10-Worst pain ever Pain  Location: R BKA surgical site Pain Descriptors / Indicators: Operative site guarding, Grimacing, Aching Pain Intervention(s): Limited activity within patient's tolerance, Monitored during session, RN gave pain meds during session     Extremity/Trunk Assessment Upper Extremity Assessment Upper Extremity Assessment: LUE deficits/detail LUE Deficits / Details: L shoulder stiffness, difficulty to assess due to anxiety with moving. LUE: Shoulder pain with ROM LUE Sensation: WNL LUE Coordination: decreased gross motor   Lower  Extremity Assessment Lower Extremity Assessment: Defer to PT evaluation       Communication Communication Communication: No apparent difficulties   Cognition Arousal: Alert Behavior During Therapy: Anxious Overall Cognitive Status: Within Functional Limits for tasks assessed                                 General Comments: Pt anxious about moving, declined bed mobility or assist to EOB     General Comments       Exercises     Shoulder Instructions      Home Living Family/patient expects to be discharged to:: Private residence Living Arrangements: Spouse/significant other Available Help at Discharge: Personal care attendant;Available PRN/intermittently Type of Home: Apartment Home Access: Stairs to enter Entrance Stairs-Number of Steps: 1 Entrance Stairs-Rails: Can reach both Home Layout: One level     Bathroom Shower/Tub: Chief Strategy Officer: Standard Bathroom Accessibility: Yes   Home Equipment: BSC/3in1;Shower seat;Wheelchair - manual   Additional Comments: patient has PCA 6 days a week 3 hours a day. all information from last admission, pt with AMS and unable to state on this evaluation      Prior Functioning/Environment Prior Level of Function : Needs assist             Mobility Comments: pivoting to wheelchair; propelling w/c; states husband bumps her up/down (in wheelchair) the single step at entry ADLs Comments: Has assist from aides for bathing and dressing        OT Problem List: Decreased strength;Decreased range of motion;Decreased activity tolerance;Impaired balance (sitting and/or standing);Decreased safety awareness;Impaired UE functional use;Pain      OT Treatment/Interventions: Self-care/ADL training;Balance training;Therapeutic exercise;Therapeutic activities;Patient/family education;DME and/or AE instruction    OT Goals(Current goals can be found in the care plan section) Acute Rehab OT Goals Patient Stated  Goal: manage pain OT Goal Formulation: With patient/family Time For Goal Achievement: 01/13/23 Potential to Achieve Goals: Good  OT Frequency: Min 1X/week    Co-evaluation              AM-PAC OT "6 Clicks" Daily Activity     Outcome Measure Help from another person eating meals?: A Little Help from another person taking care of personal grooming?: A Lot Help from another person toileting, which includes using toliet, bedpan, or urinal?: Total Help from another person bathing (including washing, rinsing, drying)?: A Lot Help from another person to put on and taking off regular upper body clothing?: A Lot Help from another person to put on and taking off regular lower body clothing?: Total 6 Click Score: 11   End of Session Nurse Communication: Mobility status  Activity Tolerance: Patient limited by pain Patient left: with call bell/phone within reach;with bed alarm set;with family/visitor present;with nursing/sitter in room  OT Visit Diagnosis: Unsteadiness on feet (R26.81);Other abnormalities of gait and mobility (R26.89);Muscle weakness (generalized) (M62.81);Pain Pain - Right/Left: Right Pain - part of body: Leg  Time: 1137-1207 OT Time Calculation (min): 30 min Charges:  OT General Charges $OT Visit: 1 Visit OT Evaluation $OT Eval Moderate Complexity: 1 Mod OT Treatments $Self Care/Home Management : 8-22 mins  [place], OTR/L   Alexis Goodell 12/30/2022, 12:13 PM

## 2022-12-31 ENCOUNTER — Other Ambulatory Visit: Payer: Self-pay

## 2022-12-31 ENCOUNTER — Inpatient Hospital Stay (HOSPITAL_COMMUNITY): Payer: 59 | Admitting: Anesthesiology

## 2022-12-31 ENCOUNTER — Encounter (HOSPITAL_COMMUNITY): Payer: Self-pay | Admitting: Internal Medicine

## 2022-12-31 ENCOUNTER — Encounter (HOSPITAL_COMMUNITY)
Admission: EM | Disposition: A | Discharge status: Skilled Nursing Facility | Payer: Self-pay | Source: Home / Self Care | Attending: Internal Medicine

## 2022-12-31 DIAGNOSIS — I120 Hypertensive chronic kidney disease with stage 5 chronic kidney disease or end stage renal disease: Secondary | ICD-10-CM

## 2022-12-31 DIAGNOSIS — N186 End stage renal disease: Secondary | ICD-10-CM | POA: Diagnosis not present

## 2022-12-31 DIAGNOSIS — D649 Anemia, unspecified: Secondary | ICD-10-CM | POA: Diagnosis not present

## 2022-12-31 DIAGNOSIS — N185 Chronic kidney disease, stage 5: Secondary | ICD-10-CM | POA: Diagnosis not present

## 2022-12-31 DIAGNOSIS — T82898A Other specified complication of vascular prosthetic devices, implants and grafts, initial encounter: Secondary | ICD-10-CM | POA: Diagnosis not present

## 2022-12-31 HISTORY — PX: FISTULA SUPERFICIALIZATION: SHX6341

## 2022-12-31 LAB — CBC
HCT: 24.4 % — ABNORMAL LOW (ref 36.0–46.0)
HCT: 25.9 % — ABNORMAL LOW (ref 36.0–46.0)
Hemoglobin: 7.3 g/dL — ABNORMAL LOW (ref 12.0–15.0)
Hemoglobin: 7.6 g/dL — ABNORMAL LOW (ref 12.0–15.0)
MCH: 29 pg (ref 26.0–34.0)
MCH: 28.1 pg (ref 26.0–34.0)
MCHC: 29.9 g/dL — ABNORMAL LOW (ref 30.0–36.0)
MCHC: 29.3 g/dL — ABNORMAL LOW (ref 30.0–36.0)
MCV: 96.8 fL (ref 80.0–100.0)
MCV: 95.9 fL (ref 80.0–100.0)
Platelets: 328 10*3/uL (ref 150–400)
Platelets: 362 10*3/uL (ref 150–400)
RBC: 2.52 MIL/uL — ABNORMAL LOW (ref 3.87–5.11)
RBC: 2.7 MIL/uL — ABNORMAL LOW (ref 3.87–5.11)
RDW: 16.7 % — ABNORMAL HIGH (ref 11.5–15.5)
RDW: 16.4 % — ABNORMAL HIGH (ref 11.5–15.5)
WBC: 16.4 10*3/uL — ABNORMAL HIGH (ref 4.0–10.5)
WBC: 18.2 10*3/uL — ABNORMAL HIGH (ref 4.0–10.5)
nRBC: 0 % (ref 0.0–0.2)
nRBC: 0 % (ref 0.0–0.2)

## 2022-12-31 LAB — RENAL FUNCTION PANEL
Albumin: 1.6 g/dL — ABNORMAL LOW (ref 3.5–5.0)
Albumin: 1.7 g/dL — ABNORMAL LOW (ref 3.5–5.0)
Anion gap: 11 (ref 5–15)
Anion gap: 9 (ref 5–15)
BUN: 24 mg/dL — ABNORMAL HIGH (ref 6–20)
BUN: 26 mg/dL — ABNORMAL HIGH (ref 6–20)
CO2: 25 mmol/L (ref 22–32)
CO2: 25 mmol/L (ref 22–32)
Calcium: 7.9 mg/dL — ABNORMAL LOW (ref 8.9–10.3)
Calcium: 7.7 mg/dL — ABNORMAL LOW (ref 8.9–10.3)
Chloride: 100 mmol/L (ref 98–111)
Chloride: 99 mmol/L (ref 98–111)
Creatinine, Ser: 4.2 mg/dL — ABNORMAL HIGH (ref 0.44–1.00)
Creatinine, Ser: 4.19 mg/dL — ABNORMAL HIGH (ref 0.44–1.00)
GFR, Estimated: 12 mL/min — ABNORMAL LOW (ref >60)
GFR, Estimated: 12 mL/min — ABNORMAL LOW (ref >60)
Glucose, Bld: 140 mg/dL — ABNORMAL HIGH (ref 70–99)
Glucose, Bld: 157 mg/dL — ABNORMAL HIGH (ref 70–99)
Phosphorus: 4.1 mg/dL (ref 2.5–4.6)
Phosphorus: 4.1 mg/dL (ref 2.5–4.6)
Potassium: 3.1 mmol/L — ABNORMAL LOW (ref 3.5–5.1)
Potassium: 3.5 mmol/L (ref 3.5–5.1)
Sodium: 136 mmol/L (ref 135–145)
Sodium: 133 mmol/L — ABNORMAL LOW (ref 135–145)

## 2022-12-31 LAB — GLUCOSE, CAPILLARY
Glucose-Capillary: 116 mg/dL — ABNORMAL HIGH (ref 70–99)
Glucose-Capillary: 100 mg/dL — ABNORMAL HIGH (ref 70–99)
Glucose-Capillary: 101 mg/dL — ABNORMAL HIGH (ref 70–99)
Glucose-Capillary: 123 mg/dL — ABNORMAL HIGH (ref 70–99)
Glucose-Capillary: 143 mg/dL — ABNORMAL HIGH (ref 70–99)
Glucose-Capillary: 238 mg/dL — ABNORMAL HIGH (ref 70–99)

## 2022-12-31 SURGERY — FISTULA SUPERFICIALIZATION
Anesthesia: General | Laterality: Left

## 2022-12-31 MED ORDER — LIDOCAINE-PRILOCAINE 2.5-2.5 % EX CREA: 1.0000 | TOPICAL_CREAM | CUTANEOUS | Status: DC | PRN

## 2022-12-31 MED ORDER — PAPAVERINE HCL 30 MG/ML IJ SOLN
INTRAMUSCULAR | Status: AC
  Filled 2022-12-31: qty 2

## 2022-12-31 MED ORDER — ACETAMINOPHEN 10 MG/ML IV SOLN
1000.0000 mg | Freq: Once | INTRAVENOUS | Status: DC | PRN
  Administered 2022-12-31: 1000 mg via INTRAVENOUS

## 2022-12-31 MED ORDER — PHENYLEPHRINE 80 MCG/ML (10ML) SYRINGE FOR IV PUSH (FOR BLOOD PRESSURE SUPPORT)
PREFILLED_SYRINGE | INTRAVENOUS | Status: AC
  Filled 2022-12-31: qty 10

## 2022-12-31 MED ORDER — PENTAFLUOROPROP-TETRAFLUOROETH EX AERO: 1.0000 | INHALATION_SPRAY | CUTANEOUS | Status: DC | PRN

## 2022-12-31 MED ORDER — LIDOCAINE 2% (20 MG/ML) 5 ML SYRINGE
INTRAMUSCULAR | Status: AC
  Filled 2022-12-31: qty 5

## 2022-12-31 MED ORDER — ONDANSETRON HCL 4 MG/2ML IJ SOLN
INTRAMUSCULAR | Status: AC
Start: 2022-12-31 — End: ?
  Filled 2022-12-31: qty 2

## 2022-12-31 MED ORDER — LIDOCAINE-EPINEPHRINE (PF) 1 %-1:200000 IJ SOLN
INTRAMUSCULAR | Status: AC
Start: 2022-12-31 — End: ?
  Filled 2022-12-31: qty 30

## 2022-12-31 MED ORDER — ONDANSETRON HCL 4 MG/2ML IJ SOLN
INTRAMUSCULAR | Status: DC | PRN
  Administered 2022-12-31: 4 mg via INTRAVENOUS

## 2022-12-31 MED ORDER — HYDROMORPHONE HCL 1 MG/ML IJ SOLN: 1.0000 mg | INTRAMUSCULAR | Status: DC | PRN

## 2022-12-31 MED ORDER — 0.9 % SODIUM CHLORIDE (POUR BTL) OPTIME
TOPICAL | Status: DC | PRN
  Administered 2022-12-31: 1000 mL

## 2022-12-31 MED ORDER — PHENYLEPHRINE HCL-NACL 20-0.9 MG/250ML-% IV SOLN
INTRAVENOUS | Status: DC | PRN
  Administered 2022-12-31: 50 ug/min via INTRAVENOUS

## 2022-12-31 MED ORDER — ACETAMINOPHEN 325 MG PO TABS
650.0000 mg | ORAL_TABLET | Freq: Four times a day (QID) | ORAL | Status: DC
  Administered 2023-01-01 – 2023-01-02 (×2): 650 mg via ORAL
  Filled 2022-12-31 (×4): qty 2

## 2022-12-31 MED ORDER — CHLORHEXIDINE GLUCONATE 0.12 % MT SOLN: 15.0000 mL | Freq: Once | OROMUCOSAL | Status: AC

## 2022-12-31 MED ORDER — LIDOCAINE 2% (20 MG/ML) 5 ML SYRINGE
INTRAMUSCULAR | Status: DC | PRN
  Administered 2022-12-31: 100 mg via INTRAVENOUS

## 2022-12-31 MED ORDER — HEPARIN SODIUM (PORCINE) 1000 UNIT/ML DIALYSIS
1000.0000 [IU] | INTRAMUSCULAR | Status: DC | PRN
  Filled 2022-12-31: qty 1

## 2022-12-31 MED ORDER — PHENYLEPHRINE 80 MCG/ML (10ML) SYRINGE FOR IV PUSH (FOR BLOOD PRESSURE SUPPORT)
PREFILLED_SYRINGE | INTRAVENOUS | Status: DC | PRN
  Administered 2022-12-31 (×2): 160 ug via INTRAVENOUS
  Administered 2022-12-31 (×2): 240 ug via INTRAVENOUS
  Administered 2022-12-31: 160 ug via INTRAVENOUS

## 2022-12-31 MED ORDER — HYDROMORPHONE HCL 1 MG/ML IJ SOLN
1.0000 mg | Freq: Once | INTRAMUSCULAR | Status: AC
  Administered 2022-12-31: 1 mg via INTRAVENOUS
  Filled 2022-12-31: qty 1

## 2022-12-31 MED ORDER — CEFAZOLIN SODIUM-DEXTROSE 2-4 GM/100ML-% IV SOLN
INTRAVENOUS | Status: AC
  Filled 2022-12-31: qty 100

## 2022-12-31 MED ORDER — DEXAMETHASONE SODIUM PHOSPHATE 10 MG/ML IJ SOLN
INTRAMUSCULAR | Status: DC | PRN
  Administered 2022-12-31: 4 mg via INTRAVENOUS

## 2022-12-31 MED ORDER — HEPARIN 6000 UNIT IRRIGATION SOLUTION
Status: AC
  Filled 2022-12-31: qty 500

## 2022-12-31 MED ORDER — EPHEDRINE 5 MG/ML INJ
INTRAVENOUS | Status: AC
  Filled 2022-12-31: qty 5

## 2022-12-31 MED ORDER — HEPARIN SODIUM (PORCINE) 1000 UNIT/ML IJ SOLN
3800.0000 [IU] | Freq: Once | INTRAMUSCULAR | Status: DC
  Filled 2022-12-31: qty 4

## 2022-12-31 MED ORDER — SUCCINYLCHOLINE CHLORIDE 200 MG/10ML IV SOSY
PREFILLED_SYRINGE | INTRAVENOUS | Status: AC
Start: 2022-12-31 — End: ?
  Filled 2022-12-31: qty 10

## 2022-12-31 MED ORDER — HYDROMORPHONE HCL 1 MG/ML IJ SOLN
1.0000 mg | INTRAMUSCULAR | Status: DC | PRN
  Administered 2022-12-31 – 2023-01-01 (×4): 2 mg via INTRAVENOUS
  Administered 2023-01-01 (×2): 1 mg via INTRAVENOUS
  Administered 2023-01-01 (×2): 2 mg via INTRAVENOUS
  Administered 2023-01-02 (×2): 1 mg via INTRAVENOUS
  Administered 2023-01-02 – 2023-01-03 (×4): 2 mg via INTRAVENOUS
  Administered 2023-01-03: 1 mg via INTRAVENOUS
  Administered 2023-01-03 – 2023-01-04 (×6): 2 mg via INTRAVENOUS
  Filled 2022-12-31 (×2): qty 2
  Filled 2022-12-31 (×2): qty 1
  Filled 2022-12-31: qty 2
  Filled 2022-12-31: qty 1
  Filled 2022-12-31 (×6): qty 2
  Filled 2022-12-31: qty 1
  Filled 2022-12-31 (×4): qty 2
  Filled 2022-12-31: qty 1
  Filled 2022-12-31 (×3): qty 2
  Filled 2022-12-31: qty 1
  Filled 2022-12-31 (×2): qty 2

## 2022-12-31 MED ORDER — SODIUM CHLORIDE 0.9 % IV SOLN: INTRAVENOUS | Status: DC | PRN

## 2022-12-31 MED ORDER — ANTICOAGULANT SODIUM CITRATE 4% (200MG/5ML) IV SOLN: 5.0000 mL | Status: DC | PRN

## 2022-12-31 MED ORDER — CEFAZOLIN IN SODIUM CHLORIDE 3-0.9 GM/100ML-% IV SOLN
3.0000 g | Freq: Once | INTRAVENOUS | Status: AC
  Administered 2022-12-31: 3 g via INTRAVENOUS

## 2022-12-31 MED ORDER — FENTANYL CITRATE (PF) 250 MCG/5ML IJ SOLN
INTRAMUSCULAR | Status: DC | PRN
  Administered 2022-12-31: 25 ug via INTRAVENOUS
  Administered 2022-12-31: 100 ug via INTRAVENOUS
  Administered 2022-12-31: 25 ug via INTRAVENOUS

## 2022-12-31 MED ORDER — ONDANSETRON HCL 4 MG/2ML IJ SOLN: 4.0000 mg | Freq: Once | INTRAMUSCULAR | Status: DC | PRN

## 2022-12-31 MED ORDER — SUCCINYLCHOLINE CHLORIDE 200 MG/10ML IV SOSY
PREFILLED_SYRINGE | INTRAVENOUS | Status: DC | PRN
Start: 2022-12-31 — End: 2022-12-31
  Administered 2022-12-31: 120 mg via INTRAVENOUS

## 2022-12-31 MED ORDER — DEXAMETHASONE SODIUM PHOSPHATE 10 MG/ML IJ SOLN
INTRAMUSCULAR | Status: AC
Start: 2022-12-31 — End: ?
  Filled 2022-12-31: qty 1

## 2022-12-31 MED ORDER — HEPARIN 6000 UNIT IRRIGATION SOLUTION
Status: DC | PRN
  Administered 2022-12-31: 1

## 2022-12-31 MED ORDER — LIDOCAINE HCL (PF) 1 % IJ SOLN
INTRAMUSCULAR | Status: AC
Start: 2022-12-31 — End: ?
  Filled 2022-12-31: qty 30

## 2022-12-31 MED ORDER — LIDOCAINE HCL (PF) 1 % IJ SOLN: 5.0000 mL | INTRAMUSCULAR | Status: DC | PRN

## 2022-12-31 MED ORDER — INSULIN ASPART 100 UNIT/ML IJ SOLN: 0.0000 [IU] | INTRAMUSCULAR | Status: DC | PRN

## 2022-12-31 MED ORDER — ORAL CARE MOUTH RINSE
15.0000 mL | Freq: Once | OROMUCOSAL | Status: AC
  Administered 2022-12-31: 15 mL via OROMUCOSAL

## 2022-12-31 MED ORDER — FENTANYL CITRATE (PF) 250 MCG/5ML IJ SOLN
INTRAMUSCULAR | Status: AC
  Filled 2022-12-31: qty 5

## 2022-12-31 MED ORDER — FENTANYL CITRATE (PF) 100 MCG/2ML IJ SOLN
INTRAMUSCULAR | Status: AC
  Filled 2022-12-31: qty 2

## 2022-12-31 MED ORDER — FENTANYL CITRATE (PF) 100 MCG/2ML IJ SOLN
25.0000 ug | INTRAMUSCULAR | Status: DC | PRN
  Administered 2022-12-31 (×2): 25 ug via INTRAVENOUS

## 2022-12-31 MED ORDER — ACETAMINOPHEN 10 MG/ML IV SOLN
INTRAVENOUS | Status: AC
  Filled 2022-12-31: qty 100

## 2022-12-31 MED ORDER — ALTEPLASE 2 MG IJ SOLR: 2.0000 mg | Freq: Once | INTRAMUSCULAR | Status: DC | PRN

## 2022-12-31 MED ORDER — PROPOFOL 10 MG/ML IV BOLUS
INTRAVENOUS | Status: AC
Start: 2022-12-31 — End: ?
  Filled 2022-12-31: qty 20

## 2022-12-31 MED ORDER — PROPOFOL 10 MG/ML IV BOLUS
INTRAVENOUS | Status: DC | PRN
  Administered 2022-12-31: 120 mg via INTRAVENOUS

## 2022-12-31 SURGICAL SUPPLY — 31 items
ADH SKN CLS APL DERMABOND .7 (GAUZE/BANDAGES/DRESSINGS) ×1
ARMBAND PINK RESTRICT EXTREMIT (MISCELLANEOUS) ×1 IMPLANT
BAG COUNTER SPONGE SURGICOUNT (BAG) ×1 IMPLANT
BAG SPNG CNTER NS LX DISP (BAG) ×1
CANISTER SUCT 3000ML PPV (MISCELLANEOUS) ×1 IMPLANT
CLIP LIGATING EXTRA MED SLVR (CLIP) ×1 IMPLANT
CLIP LIGATING EXTRA SM BLUE (MISCELLANEOUS) ×1 IMPLANT
COVER PROBE W GEL 5X96 (DRAPES) ×1 IMPLANT
DERMABOND ADVANCED .7 DNX12 (GAUZE/BANDAGES/DRESSINGS) ×1 IMPLANT
ELECT REM PT RETURN 9FT ADLT (ELECTROSURGICAL) ×1
ELECTRODE REM PT RTRN 9FT ADLT (ELECTROSURGICAL) ×1 IMPLANT
GLOVE BIO SURGEON STRL SZ7.5 (GLOVE) ×1 IMPLANT
GOWN STRL REUS W/ TWL LRG LVL3 (GOWN DISPOSABLE) ×2 IMPLANT
GOWN STRL REUS W/ TWL XL LVL3 (GOWN DISPOSABLE) ×1 IMPLANT
GOWN STRL REUS W/TWL LRG LVL3 (GOWN DISPOSABLE) ×2
GOWN STRL REUS W/TWL XL LVL3 (GOWN DISPOSABLE) ×1
KIT BASIN OR (CUSTOM PROCEDURE TRAY) ×1 IMPLANT
KIT TURNOVER KIT B (KITS) ×1 IMPLANT
NS IRRIG 1000ML POUR BTL (IV SOLUTION) ×1 IMPLANT
PACK CV ACCESS (CUSTOM PROCEDURE TRAY) ×1 IMPLANT
PAD ARMBOARD 7.5X6 YLW CONV (MISCELLANEOUS) ×2 IMPLANT
POWDER SURGICEL 3.0 GRAM (HEMOSTASIS) IMPLANT
SLING ARM FOAM STRAP LRG (SOFTGOODS) IMPLANT
SLING ARM FOAM STRAP MED (SOFTGOODS) IMPLANT
SUT MNCRL AB 4-0 PS2 18 (SUTURE) ×1 IMPLANT
SUT PROLENE 6 0 BV (SUTURE) ×1 IMPLANT
SUT VIC AB 3-0 SH 27 (SUTURE) ×1
SUT VIC AB 3-0 SH 27X BRD (SUTURE) ×1 IMPLANT
TOWEL GREEN STERILE (TOWEL DISPOSABLE) ×1 IMPLANT
UNDERPAD 30X36 HEAVY ABSORB (UNDERPADS AND DIAPERS) ×1 IMPLANT
WATER STERILE IRR 1000ML POUR (IV SOLUTION) ×1 IMPLANT

## 2022-12-31 NOTE — Transfer of Care (Signed)
Immediate Anesthesia Transfer of Care Note  Patient: ARABELLAH HAILES  Procedure(s) Performed: LEFT ARM FISTULA TRANSPOSITION (Left)  Patient Location: PACU  Anesthesia Type:General  Level of Consciousness: awake and alert   Airway & Oxygen Therapy: Patient Spontanous Breathing and Patient connected to nasal cannula oxygen  Post-op Assessment: Report given to RN and Post -op Vital signs reviewed and stable  Post vital signs: Reviewed and stable  Last Vitals:  Vitals Value Taken Time  BP 102/45 12/31/22 1219  Temp    Pulse 99 12/31/22 1221  Resp 15 12/31/22 1221  SpO2 100 % 12/31/22 1221  Vitals shown include unfiled device data.  Last Pain:  Vitals:   12/31/22 0945  TempSrc:   PainSc: 0-No pain      Patients Stated Pain Goal: 0 (12/29/22 1610)  Complications: No notable events documented.

## 2022-12-31 NOTE — Progress Notes (Signed)
PT Cancellation Note  Patient Details Name: Deborah [name] MRN: [id number] DOB: December 18, 1965   Cancelled Treatment:    Reason Eval/Treat Not Completed: (P) Patient at procedure or test/unavailable (Pt off unit for Left upper extremity fistula revision with transposition versus superficialization.) Will continue efforts per PT plan of care as schedule permits. Note pt typically has HD MWF.   Mikaylah Libbey M Coraima Tibbs 12/31/2022, 9:59 AM

## 2022-12-31 NOTE — Anesthesia Postprocedure Evaluation (Signed)
Anesthesia Post Note  Patient: Deborah [name]  Procedure(s) Performed: LEFT ARM FISTULA TRANSPOSITION (Left)     Patient location during evaluation: PACU Anesthesia Type: General Level of consciousness: awake and alert Pain management: pain level controlled Vital Signs Assessment: post-procedure vital signs reviewed and stable Respiratory status: spontaneous breathing, nonlabored ventilation, respiratory function stable and patient connected to nasal cannula oxygen Cardiovascular status: blood pressure returned to baseline and stable Postop Assessment: no apparent nausea or vomiting Anesthetic complications: no  No notable events documented.  Last Vitals:  Vitals:   12/31/22 1219 12/31/22 1230  BP: (!) 102/45 (!) 119/50  Pulse: (!) 102 (!) 106  Resp: 15 14  Temp: 36.6 C   SpO2: 100% 95%    Last Pain:  Vitals:   12/31/22 1219  TempSrc:   PainSc: 10-Worst pain ever                 Mariya Mottley S

## 2022-12-31 NOTE — Progress Notes (Signed)
Deborah [name]  VWU:[id number] DOB: [birth year] DOA: 12/14/2022 PCP: Pcp, No    Brief Narrative:  57 year old with a history of ESRD on HD MWF, PAF on Eliquis, diastolic CHF, HTN, DM2, malignant duodenal carcinoid, NAFLD, morbid obesity, and a chronic right ankle ulcer who had recently been admitted 9/16-9/24/2024 at which time she underwent an angiogram noting adequate perfusion to her right foot with ultimate disposition being treatment for gout of the right ankle.  She returned to the ER 10/8 and was found to have a hemoglobin of 5.8 with crampy abdominal pain and a history of BRBPR.  Since the time of her admission she has experienced recurrent GI bleeding.  Ultimately she was taken for an EGD and capsule endoscopy.  Additionally she has been confirmed to have osteomyelitis of the right ankle with plans for right BKA 10/23.  Goals of Care:   Code Status: Full Code   DVT prophylaxis: SCD's Start: 12/29/22 2334   Interim Hx: The patient is seen in her room postoperatively.  She is resting comfortably and in good spirits.  She has no new complaints today.  Denies chest pain or shortness of breath.  She has been making frequent requests for pain medicines to the nursing staff  Assessment & Plan:  Acute anemia due to GI bleeding/blood loss on chronic anemia of ESRD Has required 4 units PRBC total during this hospital stay -EGD noted minimal reflux esophagitis with normal-appearing duodenum and 3 small AVMs with 1 having an adherent clot - some degree of rebleeding expected if she is to resume anticoagulation - not a candidate for long-term Carafate due to ESRD (will need to stop after 2 weeks max) - outpatient colonoscopy is recommended  Recent Labs  Lab 12/27/22 0921 12/29/22 0410 12/29/22 1434 12/30/22 0426 12/31/22 0425  HGB 7.6* 6.4* 8.8* 8.0* 7.3*     ESRD on HD MWF Ongoing care per Nephrology team - to the OR today for superficialization of her L brachiocephalic AV  fistula   Chronic paroxysmal atrial fibrillation previously on DOAC Eliquis on hold - do not plan to resume given ongoing GI bleeding and known AVMs - rate well-controlled at this time  Chronic right heel wound with acutely diagnosed osteomyelitis No evidence of osteomyelitis on MRI last month -repeat MRI 10/14 now consistent with early osteomyelitis -Dr. Lajoyce Corners performed BKA 10/23 -postop care has been uncomplicated thus far  Acute toxic encephalopathy due to opiate medication MRI brain unrevealing -care with narcotic dosing  DM2 CBG within goal at present  Duodenal carcinoid Status post resection  Chronic diastolic CHF Volume management per HD  Chronic back pain -lumbar spinal stenosis with neurogenic claudication Chronically on Dilaudid at home -reportedly wheelchair-bound for several months -MRI lumbar spine earlier this year noted lumbar spondylosis worsening since 2019 -evaluated by Neurosurgery March 2024 and not felt to be appropriate for surgical approach with suggestion to continue physical therapy - will need SNF at time of discharge  Morbid obesity with NAFLD - Body mass index is 45.19 kg/m.  Mood disorder Continue usual duloxetine -diazepam added this admission  Gout Continue usual allopurinol   Family Communication: No family present at time of exam today Disposition: Medically ready for discharge to SNF when bed available   Objective: Blood pressure (!) 110/58, pulse 94, temperature 97.7 F (36.5 C), resp. rate 16, height 5\' 6"  (1.676 m), weight 127 kg, last menstrual period 10/10/2012, SpO2 99%.  Intake/Output Summary (Last 24 hours) at 12/31/2022 0848 Last data filed at  12/31/2022 0557 Gross per 24 hour  Intake 337.3 ml  Output 350 ml  Net -12.7 ml   Filed Weights   12/26/22 0500 12/28/22 0432 12/29/22 0500  Weight: 124.1 kg 123.8 kg 127 kg    Examination: General: No acute respiratory distress Lungs: Clear to auscultation bilaterally-no  wheezing Cardiovascular: RRR without murmur Abdomen: Nontender, nondistended, soft, bowel sounds positive, no rebound, no ascites, no appreciable mass Extremities: No significant edema of the extremities  CBC: Recent Labs  Lab 12/29/22 0410 12/29/22 1434 12/30/22 0426 12/31/22 0425  WBC 18.1*  --  20.6* 16.4*  HGB 6.4* 8.8* 8.0* 7.3*  HCT 22.2* 26.0* 26.7* 24.4*  MCV 96.5  --  95.0 96.8  PLT 321  --  334 328   Basic Metabolic Panel: Recent Labs  Lab 12/28/22 1259 12/29/22 1434 12/30/22 0426 12/31/22 0425  NA 137 138 134* 136  K 3.1* 3.4* 3.0* 3.1*  CL 100 99 100 100  CO2 27  --  25 25  GLUCOSE 169* 79 163* 140*  BUN 17 31* 17 24*  CREATININE 3.48* 4.30* 3.41* 4.20*  CALCIUM 8.1*  --  7.9* 7.9*  PHOS 3.2  --  3.2 4.1   GFR: Estimated Creatinine Clearance: 19.9 mL/min (A) (by C-G formula based on SCr of 4.2 mg/dL (H)).   Scheduled Meds:  (feeding supplement) PROSource Plus  30 mL Oral BID BM   acidophilus  2 capsule Oral Daily   allopurinol  100 mg Oral BID   vitamin C  1,000 mg Oral Daily   atorvastatin  10 mg Oral QHS   busPIRone  10 mg Oral BID   darbepoetin (ARANESP) injection - DIALYSIS  200 mcg Subcutaneous Q Fri-1800   docusate sodium  100 mg Oral Daily   doxercalciferol  7 mcg Intravenous Q M,W,F-HD   DULoxetine  40 mg Oral Daily   feeding supplement (NEPRO CARB STEADY)  237 mL Oral BID BM   ferric citrate  420 mg Oral TID WC   folic acid  1 mg Oral Daily   Gerhardt's butt cream   Topical BID   insulin aspart  0-6 Units Subcutaneous TID WC   insulin aspart  2 Units Subcutaneous TID WC   insulin glargine-yfgn  8 Units Subcutaneous QHS   midodrine  5 mg Oral TID WC   mirtazapine  7.5 mg Oral QHS   mupirocin ointment  1 Application Nasal BID   nutrition supplement (JUVEN)  1 packet Oral BID BM   pantoprazole  40 mg Oral Daily   sucralfate  1 g Oral TID WC & HS   zinc sulfate  220 mg Oral Daily      LOS: 17 days   Lonia Blood, MD [facility] Office  [phone] Pager - Text Page per Loretha Stapler  If 7PM-7AM, please contact night-coverage per Amion 12/31/2022, 8:48 AM

## 2022-12-31 NOTE — Progress Notes (Signed)
Subjective: Seen in room in bed, s/p AVF revision with transposition. She just got back to her room. Currently sleepy. Open for hd, should be going soon. Has some pain in her arm and her leg (sites of surgeries)  Objective Vital signs in last 24 hours: Vitals:   12/31/22 1245 12/31/22 1300 12/31/22 1315 12/31/22 1330  BP: 108/61 (!) 104/53 (!) 107/52 (!) 100/49  Pulse: 98 95 94 89  Resp: 10 12 15 17   Temp:    97.7 F (36.5 C)  TempSrc:      SpO2: 100% 100% 97% 97%  Weight:      Height:       Weight change:   Physical Exam: General: Chronically ill obese female, NAD Heart: RRR no MRG Lungs: CTA bilaterally nonlabored breathing Abdomen: Obese NABS soft NTND Extremities: Right BKA dressing dry clear , trace left pedal edema Dialysis Access: Right IJ TDC/LUA AVF  + bruit appears deep   Dialysis Orders: MWF [facility]   F180   400/800   3K/2.5 bath  126.9kg   [facility]  /left upper arm AV fistula placed April 2024 - mircera 225 mcg last 10/5 - hectorol 7 mcg   Problem/Plan: ESRD: HD on MWF sched Hypokalemia= 3.0 this a.m. we will replenish with supplement p.o. use 4K bath Right foot osteomyelitis/abscess status post right BKA 12/2322 Dr. Lajoyce Corners ABX  per Ortho/admit team BP/volume: BP stable, continue home meds, UF as tolerated. Below prior EDW - lower on discharge especially after amputation Anemia of acute GI bleed on Chronic Anemia of ESRD: Hgb 7.3. s/p EGD Multiple gastric AVMs, treated with APC and capsule endo 10/17 w/ no acute bleeding but polyps.  Rec colonoscopy. Eliquis on hold.   GI resumed Reglan.  On ESA Secondary Hyperparathyroidism/Hyperphosphatemia: CorrCa/Phos good, continue meds. Vascular access - TDC in place. Not working well last week HD requiring cath flow dwell. S/p avf revision/transposed 10/25 AMS/acute toxic encephalopathy= has resolved was due to opiate medication MRI brain unrevealing, careful dosing her with narcotics  Anthony Sar, MD [facility]  Labs: Basic Metabolic Panel: Recent Labs  Lab 12/28/22 1259 12/29/22 1434 12/30/22 0426 12/31/22 0425  NA 137 138 134* 136  K 3.1* 3.4* 3.0* 3.1*  CL 100 99 100 100  CO2 27  --  25 25  GLUCOSE 169* 79 163* 140*  BUN 17 31* 17 24*  CREATININE 3.48* 4.30* 3.41* 4.20*  CALCIUM 8.1*  --  7.9* 7.9*  PHOS 3.2  --  3.2 4.1   Liver Function Tests: Recent Labs  Lab 12/28/22 1259 12/30/22 0426 12/31/22 0425  ALBUMIN 1.8* 1.9* 1.6*   No results for input(s): "LIPASE", "AMYLASE" in the last 168 hours. No results for input(s): "AMMONIA" in the last 168 hours. CBC: Recent Labs  Lab 12/25/22 0227 12/27/22 0921 12/29/22 0410 12/29/22 1434 12/30/22 0426 12/31/22 0425  WBC 20.2* 22.8* 18.1*  --  20.6* 16.4*  HGB 7.0* 7.6* 6.4* 8.8* 8.0* 7.3*  HCT 23.7* 25.5* 22.2* 26.0* 26.7* 24.4*  MCV 96.0 96.2 96.5  --  95.0 96.8  PLT 243 342 321  --  334 328   Cardiac Enzymes: No results for input(s): "CKTOTAL", "CKMB", "CKMBINDEX", "TROPONINI" in the last 168 hours. CBG: Recent Labs  Lab 12/30/22 1650 12/30/22 2117 12/31/22 0746 12/31/22 0937 12/31/22 1223  GLUCAP 136* 160* 116* 100* 101*    Studies/Results: No results found. Medications:  acetaminophen     anticoagulant sodium citrate     ceFAZolin      (  feeding supplement) PROSource Plus  30 mL Oral BID BM   acetaminophen  650 mg Oral Q6H   acidophilus  2 capsule Oral Daily   allopurinol  100 mg Oral BID   vitamin C  1,000 mg Oral Daily   atorvastatin  10 mg Oral QHS   busPIRone  10 mg Oral BID   darbepoetin (ARANESP) injection - DIALYSIS  200 mcg Subcutaneous Q Fri-1800   docusate sodium  100 mg Oral Daily   doxercalciferol  7 mcg Intravenous Q M,W,F-HD   DULoxetine  40 mg Oral Daily   feeding supplement (NEPRO CARB STEADY)  237 mL Oral BID BM   fentaNYL       ferric citrate  420 mg Oral TID WC   folic acid  1 mg Oral Daily   Gerhardt's butt cream   Topical BID   insulin aspart  0-6 Units Subcutaneous  TID WC   insulin aspart  2 Units Subcutaneous TID WC   insulin glargine-yfgn  8 Units Subcutaneous QHS   midodrine  5 mg Oral TID WC   mirtazapine  7.5 mg Oral QHS   mupirocin ointment  1 Application Nasal BID   nutrition supplement (JUVEN)  1 packet Oral BID BM   pantoprazole  40 mg Oral Daily   sucralfate  1 g Oral TID WC & HS   zinc sulfate  220 mg Oral Daily

## 2022-12-31 NOTE — Plan of Care (Signed)
  Problem: Activity: Goal: Ability to return to baseline activity level will improve Outcome: Progressing   Problem: Cardiovascular: Goal: Ability to achieve and maintain adequate cardiovascular perfusion will improve Outcome: Progressing   Problem: Coping: Goal: Ability to adjust to condition or change in health will improve Outcome: Progressing   Problem: Fluid Volume: Goal: Ability to maintain a balanced intake and output will improve Outcome: Progressing   Problem: Health Behavior/Discharge Planning: Goal: Ability to identify and utilize available resources and services will improve Outcome: Progressing Goal: Ability to manage health-related needs will improve Outcome: Progressing   Problem: Metabolic: Goal: Ability to maintain appropriate glucose levels will improve Outcome: Progressing   Problem: Nutritional: Goal: Progress toward achieving an optimal weight will improve Outcome: Progressing   Problem: Skin Integrity: Goal: Risk for impaired skin integrity will decrease Outcome: Progressing   Problem: Tissue Perfusion: Goal: Adequacy of tissue perfusion will improve Outcome: Progressing

## 2022-12-31 NOTE — Progress Notes (Signed)
Received patient in bed to unit.  Alert and oriented.  Informed consent signed and in chart.   TX duration: 2 hours and 12 minutes.  Patient's cartridge clotted off.  Dr. Thedore Mins alerted and per Dr. Thedore Mins it was ok to end session.  Patient tolerated well.  Transported back to the room  Alert, without acute distress.  Hand-off given to patient's nurse.   Access used: R internal jugular HD Cath Access issues: Cartridge clotted off  Total UF removed: Medication(s) given: Dialudid   12/31/22 1741  Vitals  Temp 98 F (36.7 C)  Temp Source Oral  BP (!) 109/55  Pulse Rate 99  ECG Heart Rate 99  Resp 19  Oxygen Therapy  SpO2 100 %  O2 Device Room Air  During Treatment Monitoring  HD Safety Checks Performed Yes  Intra-Hemodialysis Comments  (session terminated due to cartridge clotting off.  Dr. Thedore Mins notified and gave the ok for session to be over.)  Dialysis Fluid Bolus Normal Saline  Bolus Amount (mL) 300 mL  Hemodialysis Catheter Right Subclavian Double lumen Permanent (Tunneled)  Placement Date/Time: 06/24/22 1644   Serial / Lot #: [id number]  Expiration Date: 09/08/26  Time Out: Correct patient;Correct site;Correct procedure  Maximum sterile barrier precautions: Hand hygiene;Mask;Cap;Sterile gloves;Sterile gown;Large sterile ...  Site Condition No complications  Blue Lumen Status Flushed;Heparin locked;Dead end cap in place  Red Lumen Status Flushed;Heparin locked;Dead end cap in place  Purple Lumen Status N/A  Catheter fill solution Heparin 1000 units/ml  Catheter fill volume (Arterial) 1.9 cc  Catheter fill volume (Venous) 1.9  Dressing Type Transparent  Dressing Status Antimicrobial disc in place;Clean, Dry, Intact  Interventions  (deaccessed)  Drainage Description None  Dressing Change Due 01/05/23  Post treatment catheter status Capped and Clamped     Stacie Glaze LPN [facility]

## 2022-12-31 NOTE — Progress Notes (Signed)
  Progress Note    12/31/2022 8:53 AM 2 Days Post-Op  Subjective: No overnight issues   Vitals:   12/31/22 0557 12/31/22 0749  BP: 110/66 (!) 110/58  Pulse: 94 94  Resp: 18 16  Temp: 98.6 F (37 C) 97.7 F (36.5 C)  SpO2: 100% 99%    Physical Exam: Strong left upper arm AV fistula with thrill  CBC    Component Value Date/Time   WBC 16.4 (H) 12/31/2022 0425   RBC 2.52 (L) 12/31/2022 0425   HGB 7.3 (L) 12/31/2022 0425   HGB 8.8 (L) 10/09/2019 0906   HGB 9.6 (L) 09/03/2019 1620   HCT 24.4 (L) 12/31/2022 0425   HCT 30.4 (L) 09/03/2019 1620   PLT 328 12/31/2022 0425   PLT 348 10/09/2019 0906   PLT 451 (H) 09/03/2019 1620   MCV 96.8 12/31/2022 0425   MCV 85 09/03/2019 1620   MCH 29.0 12/31/2022 0425   MCHC 29.9 (L) 12/31/2022 0425   RDW 16.7 (H) 12/31/2022 0425   RDW 13.6 09/03/2019 1620   LYMPHSABS 1.8 12/20/2022 0259   LYMPHSABS 2.2 09/03/2019 1620   MONOABS 0.9 12/20/2022 0259   EOSABS 0.4 12/20/2022 0259   EOSABS 0.3 09/03/2019 1620   BASOSABS 0.1 12/20/2022 0259   BASOSABS 0.1 09/03/2019 1620    BMET    Component Value Date/Time   NA 136 12/31/2022 0425   NA 137 09/20/2019 1530   K 3.1 (L) 12/31/2022 0425   CL 100 12/31/2022 0425   CO2 25 12/31/2022 0425   GLUCOSE 140 (H) 12/31/2022 0425   BUN 24 (H) 12/31/2022 0425   BUN 18 09/20/2019 1530   CREATININE 4.20 (H) 12/31/2022 0425   CALCIUM 7.9 (L) 12/31/2022 0425   GFRNONAA 12 (L) 12/31/2022 0425   GFRAA 49 (L) 11/30/2019 0356    INR    Component Value Date/Time   INR 1.9 (H) 12/15/2022 0157     Intake/Output Summary (Last 24 hours) at 12/31/2022 0853 Last data filed at 12/31/2022 0557 Gross per 24 hour  Intake 337.3 ml  Output 350 ml  Net -12.7 ml     Assessment:  57 y.o. female is s/p right below-knee amputation with matured left upper arm AV fistula that requires superficialization versus transposition.  Plan: Plan for left upper extremity fistula revision with transposition  versus superficialization today in the OR.  Vyron Fronczak C. Randie Heinz, MD [facility] Office: [phone] Pager: [phone]  12/31/2022 8:53 AM

## 2022-12-31 NOTE — Progress Notes (Addendum)
CCC Pre-op Review  Pre-op checklist:  not completed at time of note  NPO:  yes  Labs: todays labs pending at time of note             10/25 @0430  h/h 7.3/24    k 3.4  Cr 4.30              10/25 am CBG 116  Consent:  completed  H&P:  not completed  Vitals:   @0749  this am  97.7  p94 r16  110/58 99% RA  O2 requirements:   RA  MAR/PTA review:  Ancef q8     IV:     20g RFA   & DL HD cath  Floor nurse name:    Additional info:   ESRD /dialysis MWF  PAF on Eliquis  Sacral wound

## 2022-12-31 NOTE — Progress Notes (Signed)
CHG not completed due to pt is allergic.

## 2022-12-31 NOTE — Progress Notes (Signed)
    OK to restart anticoagulation at anytime from a vascular point of view   Mosetta Pigeon PA-C

## 2022-12-31 NOTE — Progress Notes (Signed)
    Patient seen on dialysis with very strong thrill in her left upper arm with a palpable left radial pulse and her left hand is sensorimotor intact.  Plan her for follow-up in 3 to 4 weeks with left upper arm dialysis duplex given that her fistula is now 48 months old and can release her to use the fistula at that time.  Raymonde Hamblin C. Randie Heinz, MD [facility] [place] Office: [phone] Pager: [phone]

## 2022-12-31 NOTE — Anesthesia Preprocedure Evaluation (Signed)
Anesthesia Evaluation  Patient identified by MRN, date of birth, ID band Patient awake    Reviewed: Allergy & Precautions, H&P , NPO status , Patient's Chart, lab work & pertinent test results  Airway Mallampati: II  TM Distance: >3 FB Neck ROM: Full    Dental no notable dental hx.    Pulmonary neg pulmonary ROS   Pulmonary exam normal breath sounds clear to auscultation       Cardiovascular hypertension, + Peripheral Vascular Disease  Normal cardiovascular exam Rhythm:Regular Rate:Normal     Neuro/Psych CVA  negative psych ROS   GI/Hepatic Neg liver ROS,GERD  ,,  Endo/Other  diabetes  Morbid obesity  Renal/GU ESRFRenal disease  negative genitourinary   Musculoskeletal negative musculoskeletal ROS (+)    Abdominal   Peds negative pediatric ROS (+)  Hematology  (+) Blood dyscrasia, anemia   Anesthesia Other Findings   Reproductive/Obstetrics negative OB ROS                             Anesthesia Physical Anesthesia Plan  ASA: 4  Anesthesia Plan: General   Post-op Pain Management: Minimal or no pain anticipated   Induction: Intravenous  PONV Risk Score and Plan: 3 and Ondansetron, Dexamethasone and Treatment may vary due to age or medical condition  Airway Management Planned: Oral ETT  Additional Equipment:   Intra-op Plan:   Post-operative Plan: Extubation in OR  Informed Consent: I have reviewed the patients History and Physical, chart, labs and discussed the procedure including the risks, benefits and alternatives for the proposed anesthesia with the patient or authorized representative who has indicated his/her understanding and acceptance.     Dental advisory given  Plan Discussed with: CRNA and Surgeon  Anesthesia Plan Comments:        Anesthesia Quick Evaluation

## 2022-12-31 NOTE — Anesthesia Procedure Notes (Signed)
Procedure Name: Intubation Date/Time: 12/31/2022 11:02 AM  Performed by: Little Ishikawa, CRNAPre-anesthesia Checklist: Patient identified, Emergency Drugs available, Suction available, Timeout performed and Patient being monitored Patient Re-evaluated:Patient Re-evaluated prior to induction Oxygen Delivery Method: Circle system utilized Preoxygenation: Pre-oxygenation with 100% oxygen Induction Type: IV induction Ventilation: Mask ventilation without difficulty and Oral airway inserted - appropriate to patient size Laryngoscope Size: Mac and 4 Grade View: Grade I Tube type: Oral Tube size: 7.0 mm Airway Equipment and Method: Stylet Placement Confirmation: ETT inserted through vocal cords under direct vision, positive ETCO2, CO2 detector and breath sounds checked- equal and bilateral Secured at: 22 cm Tube secured with: Tape Dental Injury: Teeth and Oropharynx as per pre-operative assessment

## 2022-12-31 NOTE — Op Note (Signed)
Patient name: Deborah [name] MRN: [id number] DOB: [birth year] Sex: female  12/31/2022 Pre-operative Diagnosis: ESRD Post-operative diagnosis:  Same Surgeon:  Apolinar Junes C. Randie Heinz, MD Assistant: Clinton Gallant, PA Procedure Performed:  Revision left arm cephalic vein AV fistula with transposition  Indications: 57 year old female with a history of end-stage renal disease currently dialyzing via catheter.  She is admitted and has undergone right lower extremity amputation and has a an existing brachiocephalic fistula on the left which is too deep for cannulation.  This has been present for approximately 6 months and she is now indicated for revision to superficialize the  fistula for use in the near future.   An experienced assistant was necessary to facilitate exposure of the entirety of the fistula as well as tunnel laterally and perform end to end anastomosis   Findings: Fistula throughout its course measured approximately 8 mm to 1 cm.  After full mobilization this was transected near the antecubitum and tunneled laterally and was very superficial there.  At completion there was a very strong thrill in the fistula and a palpable radial artery pulse at the wrist all confirmed with Doppler.   Procedure:  The patient was identified in the holding area and taken to the operating room where she was placed upon operative table and general anesthesia was induced.  She was to the prepped and draped in the left upper extremity in the usual fashion, antibiotics were administered a timeout was called.  We began using ultrasound to traced the fistula on the arm and then created 2 incisions in the upper arm and dissected down to the fistula.  Branches were divided between ties.  The fistula was fully mobilized from the antecubitum to nearly the level of the axilla but more lateral in the arm.  We marked for orientation.  We clamped near the antecubitum and transected it.  We tunneled laterally in a very  superficial plane.  We then flushed with heparinized saline and spatulated both ends and sewed them end to end with 6-0 Prolene suture.  Prior completion without flushing all directions.  Upon completion there was a very strong thrill in the fistula itself and this was traced with Doppler and there was also a palpable radial artery pulse at the wrist confirmed with Doppler.  We then obtained hemostasis in the wound and thoroughly irrigated and then closed in layers of Vicryl and Monocryl.  Dermabond was placed at the skin level.  The patient was awakened from anesthesia having tolerated the procedure well without immediate complication.  All counts were correct at completion.  EBL: 50 cc    Wilver Tignor C. Randie Heinz, MD [facility] [place] Office: [phone] Pager: [phone]

## 2023-01-01 ENCOUNTER — Encounter (HOSPITAL_COMMUNITY): Payer: Self-pay | Admitting: Vascular Surgery

## 2023-01-01 ENCOUNTER — Inpatient Hospital Stay (HOSPITAL_COMMUNITY): Payer: 59

## 2023-01-01 DIAGNOSIS — D649 Anemia, unspecified: Secondary | ICD-10-CM | POA: Diagnosis not present

## 2023-01-01 LAB — CBC
HCT: 25.1 % — ABNORMAL LOW (ref 36.0–46.0)
Hemoglobin: 7.7 g/dL — ABNORMAL LOW (ref 12.0–15.0)
MCH: 28.5 pg (ref 26.0–34.0)
MCHC: 30.7 g/dL (ref 30.0–36.0)
MCV: 93 fL (ref 80.0–100.0)
Platelets: 387 10*3/uL (ref 150–400)
RBC: 2.7 MIL/uL — ABNORMAL LOW (ref 3.87–5.11)
RDW: 16.2 % — ABNORMAL HIGH (ref 11.5–15.5)
WBC: 18.9 10*3/uL — ABNORMAL HIGH (ref 4.0–10.5)
nRBC: 0 % (ref 0.0–0.2)

## 2023-01-01 LAB — GLUCOSE, CAPILLARY
Glucose-Capillary: 186 mg/dL — ABNORMAL HIGH (ref 70–99)
Glucose-Capillary: 194 mg/dL — ABNORMAL HIGH (ref 70–99)
Glucose-Capillary: 139 mg/dL — ABNORMAL HIGH (ref 70–99)
Glucose-Capillary: 133 mg/dL — ABNORMAL HIGH (ref 70–99)

## 2023-01-01 MED ORDER — LORAZEPAM 1 MG PO TABS: 1.0000 mg | ORAL_TABLET | Freq: Once | ORAL | Status: DC | PRN

## 2023-01-01 NOTE — Progress Notes (Signed)
Patient ID: Deborah [name], female   DOB: 11/05/1965, 57 y.o.   MRN: [id number] Patient continues to improve status post below-knee amputation.  There is no drainage of the wound VAC canister there was a good suction fit.  Anticipate discharge to skilled nursing.  Patient has Prevalon boots and states that she would like to use the [facility] boot on the left.  This is ordered.  Patient will discharge with the Prevena plus portable wound VAC pump.

## 2023-01-01 NOTE — Progress Notes (Signed)
[facility] Progress Note   Subjective:  Seen in room - L arm is tender s/p AVF transposition yesterday. Dialyzed last night - 1.8L off. No CP/dyspnea today. Leg pain is stable.   Objective Vitals:   12/31/22 1841 12/31/22 2220 01/01/23 0500 01/01/23 0759  BP: 127/78 110/63  132/70  Pulse: 98 100  96  Resp: 16 16  14   Temp: 97.8 F (36.6 C) 97.8 F (36.6 C)  97.9 F (36.6 C)  TempSrc:      SpO2: 100% 100%  100%  Weight:   121 kg   Height:       Physical Exam General: Chronically ill appearing woman, NAD. Room air. Heart: RRR; no murmur Lungs: CTA anteriorly, no wheezing Abdomen: soft Extremities: R BKA in hard brace, trace LLE edema with heel bandaged Dialysis Access: TDC in R chest, L AVF + bruit, two glued incisions  Additional Objective Labs: Basic Metabolic Panel: Recent Labs  Lab 12/30/22 0426 12/31/22 0425 12/31/22 1505  NA 134* 136 133*  K 3.0* 3.1* 3.5  CL 100 100 99  CO2 25 25 25   GLUCOSE 163* 140* 157*  BUN 17 24* 26*  CREATININE 3.41* 4.20* 4.19*  CALCIUM 7.9* 7.9* 7.7*  PHOS 3.2 4.1 4.1   Liver Function Tests: Recent Labs  Lab 12/30/22 0426 12/31/22 0425 12/31/22 1505  ALBUMIN 1.9* 1.6* 1.7*   CBC: Recent Labs  Lab 12/27/22 0921 12/29/22 0410 12/29/22 1434 12/30/22 0426 12/31/22 0425 12/31/22 1505  WBC 22.8* 18.1*  --  20.6* 16.4* 18.2*  HGB 7.6* 6.4*   < > 8.0* 7.3* 7.6*  HCT 25.5* 22.2*   < > 26.7* 24.4* 25.9*  MCV 96.2 96.5  --  95.0 96.8 95.9  PLT 342 321  --  334 328 362   < > = values in this interval not displayed.   Medications:  anticoagulant sodium citrate      (feeding supplement) PROSource Plus  30 mL Oral BID BM   acetaminophen  650 mg Oral Q6H   acidophilus  2 capsule Oral Daily   allopurinol  100 mg Oral BID   vitamin C  1,000 mg Oral Daily   atorvastatin  10 mg Oral QHS   busPIRone  10 mg Oral BID   darbepoetin (ARANESP) injection - DIALYSIS  200 mcg Subcutaneous Q Fri-1800   docusate sodium   100 mg Oral Daily   doxercalciferol  7 mcg Intravenous Q M,W,F-HD   DULoxetine  40 mg Oral Daily   feeding supplement (NEPRO CARB STEADY)  237 mL Oral BID BM   ferric citrate  420 mg Oral TID WC   folic acid  1 mg Oral Daily   Gerhardt's butt cream   Topical BID   heparin sodium (porcine)  3,800 Units Intracatheter Once   insulin aspart  0-6 Units Subcutaneous TID WC   insulin aspart  2 Units Subcutaneous TID WC   insulin glargine-yfgn  8 Units Subcutaneous QHS   midodrine  5 mg Oral TID WC   mirtazapine  7.5 mg Oral QHS   mupirocin ointment  1 Application Nasal BID   nutrition supplement (JUVEN)  1 packet Oral BID BM   pantoprazole  40 mg Oral Daily   sucralfate  1 g Oral TID WC & HS   zinc sulfate  220 mg Oral Daily    Dialysis Orders: [facility]  4hr, 180dialyzer, 400/800, EDW 126.9kg, 3K/2.5Ca bath, TDC in R chest, 1st stage LUE BVT 06/2022 - mircera  225 mcg last 10/5 - hectorol 7 mcg   Problem/Plan: ESRD: Continue HD on MWF schedule - next 10/28. Hypokalemia: Using 4K bath with HD. Right foot osteomyelitis/abscess s/p R BKA  on 12/29/22: Per ortho.  BP/volume: BP stable, edema improving. Will have lower EDW on d/c s/p amputation. Anemia (ABLA/GIB + ESRD): S/p EGD Multiple gastric AVMs, treated with APC and capsule endo 10/17 showing polyps only. Colonoscopy recommended. Getting Aranesp q Friday here. Hgb 7.6 - stable. Secondary Hyperparathyroidism/Hyperphosphatemia: CorrCa/Phos good, continue meds. Vascular access: TDC in place, s/p AVF revision/transposition 10/25. AMS/acute toxic encephalopathy (resolved): Med induced, likely. MRI without acute findings.  T2DM: Insulin per primary.  Ozzie Hoyle, PA-C 01/01/2023, 9:19 AM  [employer]

## 2023-01-01 NOTE — Progress Notes (Signed)
Deborah [name]  WUJ:[id number] DOB: [birth year] DOA: 12/14/2022 PCP: Pcp, No    Brief Narrative:  57 year old with a history of ESRD on HD MWF, PAF on Eliquis, diastolic CHF, HTN, DM2, malignant duodenal carcinoid, NAFLD, morbid obesity, and a chronic right ankle ulcer who had recently been admitted 9/16-9/24/2024 at which time she underwent an angiogram noting adequate perfusion to her right foot with ultimate disposition being treatment for gout of the right ankle.  She returned to the ER 10/8 and was found to have a hemoglobin of 5.8 with crampy abdominal pain and a history of BRBPR.  Since the time of her admission she has experienced recurrent GI bleeding.  Ultimately she was taken for an EGD and capsule endoscopy.  Additionally she has been confirmed to have osteomyelitis of the right ankle with plans for right BKA 10/23.  Goals of Care:   Code Status: Full Code   DVT prophylaxis: SCD's Start: 12/29/22 2334   Interim Hx: Afebrile.  Vital signs stable.  No acute events recorded overnight.  Patient is alert and conversant today, but newly confused.  Her speech is tangential and rambling.  She is making repetitive rolling movements of her tongue.  She is now just reporting that she has not been able to move her right arm or leg since her amputation surgery, though this is not consistent with previous exams and I feel as a result of her confusion.  Her cranial nerves are intact.  She is mildly agitated and argumentative.  She is also reporting severe difficulty swallowing such that she cannot take her medications, but she is effectively eating a grilled cheese sandwich when I enter the room.  Assessment & Plan:  Acute encephalopathy of unclear etiology -previously due to opiate medication during this admission MRI brain unrevealing 10/9 -perhaps narcotics are to blame again now but will obtain CT head to rule out an acute intracranial event -minimize meds that could lead to dystonic  reaction given tongue rolling behavior  Acute anemia due to GI bleeding/blood loss on chronic anemia of ESRD Has required 4 units PRBC total during this hospital stay -EGD noted minimal reflux esophagitis with normal-appearing duodenum and 3 small AVMs with 1 having an adherent clot - some degree of rebleeding expected if she is to resume anticoagulation - not a candidate for long-term Carafate due to ESRD (will need to stop after 2 weeks max) - outpatient colonoscopy is recommended -hemoglobin appears to have stabilized for now  Recent Labs  Lab 12/29/22 0410 12/29/22 1434 12/30/22 0426 12/31/22 0425 12/31/22 1505  HGB 6.4* 8.8* 8.0* 7.3* 7.6*     ESRD on HD MWF Ongoing care per Nephrology team - to the OR 10/25 for superficialization of her L brachiocephalic AV fistula   Chronic paroxysmal atrial fibrillation previously on DOAC Eliquis stopped and do not plan to resume given ongoing GI bleeding and known AVMs - rate well-controlled at this time  Chronic right heel wound with acutely diagnosed osteomyelitis No evidence of osteomyelitis on MRI last month -repeat MRI 10/14 consistent with early osteomyelitis -Dr. Lajoyce Corners performed BKA 10/23 -postop care has been uncomplicated thus far  DM2 CBG within goal at present  Duodenal carcinoid Status post resection  Chronic diastolic CHF Volume management per HD -appears euvolemic presently  Chronic back pain -lumbar spinal stenosis with neurogenic claudication Chronically on Dilaudid at home -reportedly wheelchair-bound for several months -MRI lumbar spine earlier this year noted lumbar spondylosis worsening since 2019 -evaluated by Neurosurgery March 2024  and not felt to be appropriate for surgical approach with suggestion to continue physical therapy - will need SNF at time of discharge  Morbid obesity with NAFLD - Body mass index is 43.06 kg/m.  Mood disorder Continue usual duloxetine -diazepam added this admission  Gout Continue  usual allopurinol   Family Communication: No family present at time of exam  Disposition: Medically ready for discharge to SNF when bed available   Objective: Blood pressure 132/70, pulse 96, temperature 97.9 F (36.6 C), resp. rate 14, height 5\' 6"  (1.676 m), weight 121 kg, last menstrual period 10/10/2012, SpO2 100%.  Intake/Output Summary (Last 24 hours) at 01/01/2023 0853 Last data filed at 12/31/2022 1755 Gross per 24 hour  Intake 450 ml  Output 1820 ml  Net -1370 ml   Filed Weights   12/31/22 1454 12/31/22 1800 01/01/23 0500  Weight: 121.9 kg 120.3 kg 121 kg    Examination: General: No acute respiratory distress -alert and conversant though confused Lungs: Clear to auscultation bilaterally without wheezing Cardiovascular: RRR without murmur Abdomen: Nontender, nondistended, soft, bowel sounds positive, no rebound, no ascites, no appreciable mass Extremities: No significant edema of the extremities Neuro: Alert and conversant but not fully oriented -moves all 4 extremities-cranial nerves intact bilaterally -speech clear though rambling -exhibits nearly continuous rolling of the tongue  CBC: Recent Labs  Lab 12/30/22 0426 12/31/22 0425 12/31/22 1505  WBC 20.6* 16.4* 18.2*  HGB 8.0* 7.3* 7.6*  HCT 26.7* 24.4* 25.9*  MCV 95.0 96.8 95.9  PLT 334 328 362   Basic Metabolic Panel: Recent Labs  Lab 12/30/22 0426 12/31/22 0425 12/31/22 1505  NA 134* 136 133*  K 3.0* 3.1* 3.5  CL 100 100 99  CO2 25 25 25   GLUCOSE 163* 140* 157*  BUN 17 24* 26*  CREATININE 3.41* 4.20* 4.19*  CALCIUM 7.9* 7.9* 7.7*  PHOS 3.2 4.1 4.1   GFR: Estimated Creatinine Clearance: 19.4 mL/min (A) (by C-G formula based on SCr of 4.19 mg/dL (H)).   Scheduled Meds:  (feeding supplement) PROSource Plus  30 mL Oral BID BM   acetaminophen  650 mg Oral Q6H   acidophilus  2 capsule Oral Daily   allopurinol  100 mg Oral BID   vitamin C  1,000 mg Oral Daily   atorvastatin  10 mg Oral QHS    busPIRone  10 mg Oral BID   darbepoetin (ARANESP) injection - DIALYSIS  200 mcg Subcutaneous Q Fri-1800   docusate sodium  100 mg Oral Daily   doxercalciferol  7 mcg Intravenous Q M,W,F-HD   DULoxetine  40 mg Oral Daily   feeding supplement (NEPRO CARB STEADY)  237 mL Oral BID BM   ferric citrate  420 mg Oral TID WC   folic acid  1 mg Oral Daily   Gerhardt's butt cream   Topical BID   heparin sodium (porcine)  3,800 Units Intracatheter Once   insulin aspart  0-6 Units Subcutaneous TID WC   insulin aspart  2 Units Subcutaneous TID WC   insulin glargine-yfgn  8 Units Subcutaneous QHS   midodrine  5 mg Oral TID WC   mirtazapine  7.5 mg Oral QHS   mupirocin ointment  1 Application Nasal BID   nutrition supplement (JUVEN)  1 packet Oral BID BM   pantoprazole  40 mg Oral Daily   sucralfate  1 g Oral TID WC & HS   zinc sulfate  220 mg Oral Daily      LOS: 18 days  Lonia Blood, MD [facility] Office  [phone] Pager - Text Page per Amion  If 7PM-7AM, please contact night-coverage per Amion 01/01/2023, 8:53 AM

## 2023-01-01 NOTE — Plan of Care (Signed)
  Problem: Education: Goal: Understanding of CV disease, CV risk reduction, and recovery process will improve Outcome: Progressing Goal: Individualized Educational Video(s) Outcome: Progressing   Problem: Activity: Goal: Ability to return to baseline activity level will improve Outcome: Progressing   Problem: Cardiovascular: Goal: Ability to achieve and maintain adequate cardiovascular perfusion will improve Outcome: Progressing Goal: Vascular access site(s) Level 0-1 will be maintained Outcome: Progressing   Problem: Health Behavior/Discharge Planning: Goal: Ability to safely manage health-related needs after discharge will improve Outcome: Progressing   Problem: Education: Goal: Ability to describe self-care measures that may prevent or decrease complications (Diabetes Survival Skills Education) will improve Outcome: Progressing Goal: Individualized Educational Video(s) Outcome: Progressing   Problem: Coping: Goal: Ability to adjust to condition or change in health will improve Outcome: Progressing   Problem: Fluid Volume: Goal: Ability to maintain a balanced intake and output will improve Outcome: Progressing   Problem: Health Behavior/Discharge Planning: Goal: Ability to identify and utilize available resources and services will improve Outcome: Progressing Goal: Ability to manage health-related needs will improve Outcome: Progressing   Problem: Metabolic: Goal: Ability to maintain appropriate glucose levels will improve Outcome: Progressing   Problem: Nutritional: Goal: Progress toward achieving an optimal weight will improve Outcome: Progressing   Problem: Skin Integrity: Goal: Risk for impaired skin integrity will decrease Outcome: Progressing   Problem: Tissue Perfusion: Goal: Adequacy of tissue perfusion will improve Outcome: Progressing   Problem: Education: Goal: Knowledge of General Education information will improve Description: Including pain  rating scale, medication(s)/side effects and non-pharmacologic comfort measures Outcome: Progressing   Problem: Health Behavior/Discharge Planning: Goal: Ability to manage health-related needs will improve Outcome: Progressing   Problem: Clinical Measurements: Goal: Ability to maintain clinical measurements within normal limits will improve Outcome: Progressing Goal: Will remain free from infection Outcome: Progressing Goal: Diagnostic test results will improve Outcome: Progressing Goal: Respiratory complications will improve Outcome: Progressing Goal: Cardiovascular complication will be avoided Outcome: Progressing   Problem: Activity: Goal: Risk for activity intolerance will decrease Outcome: Progressing   Problem: Nutrition: Goal: Adequate nutrition will be maintained Outcome: Progressing   Problem: Coping: Goal: Level of anxiety will decrease Outcome: Progressing   Problem: Elimination: Goal: Will not experience complications related to bowel motility Outcome: Progressing   Problem: Pain Managment: Goal: General experience of comfort will improve Outcome: Progressing   Problem: Safety: Goal: Ability to remain free from injury will improve Outcome: Progressing   Problem: Skin Integrity: Goal: Risk for impaired skin integrity will decrease Outcome: Progressing   Problem: Education: Goal: Knowledge of the prescribed therapeutic regimen will improve Outcome: Progressing Goal: Ability to verbalize activity precautions or restrictions will improve Outcome: Progressing Goal: Understanding of discharge needs will improve Outcome: Progressing   Problem: Activity: Goal: Ability to perform//tolerate increased activity and mobilize with assistive devices will improve Outcome: Progressing   Problem: Clinical Measurements: Goal: Postoperative complications will be avoided or minimized Outcome: Progressing   Problem: Self-Care: Goal: Ability to meet self-care  needs will improve Outcome: Progressing   Problem: Self-Concept: Goal: Ability to maintain and perform role responsibilities to the fullest extent possible will improve Outcome: Progressing   Problem: Pain Management: Goal: Pain level will decrease with appropriate interventions Outcome: Progressing   Problem: Skin Integrity: Goal: Demonstration of wound healing without infection will improve Outcome: Progressing

## 2023-01-01 NOTE — Progress Notes (Signed)
Orthopedic Tech Progress Note Patient Details:  Deborah [name] 1965/10/12 [id number]  Ortho Devices Type of Ortho Device: Prafo boot/shoe Ortho Device/Splint Location: LLE Ortho Device/Splint Interventions: Ordered, Adjustment, Application   Post Interventions Patient Tolerated: Well Instructions Provided: Care of device, Adjustment of device  [name] Carmine Savoy 01/01/2023, 6:20 PM

## 2023-01-02 DIAGNOSIS — D649 Anemia, unspecified: Secondary | ICD-10-CM | POA: Diagnosis not present

## 2023-01-02 LAB — CBC
HCT: 25.8 % — ABNORMAL LOW (ref 36.0–46.0)
Hemoglobin: 7.8 g/dL — ABNORMAL LOW (ref 12.0–15.0)
MCH: 29 pg (ref 26.0–34.0)
MCHC: 30.2 g/dL (ref 30.0–36.0)
MCV: 95.9 fL (ref 80.0–100.0)
Platelets: 413 10*3/uL — ABNORMAL HIGH (ref 150–400)
RBC: 2.69 MIL/uL — ABNORMAL LOW (ref 3.87–5.11)
RDW: 16.5 % — ABNORMAL HIGH (ref 11.5–15.5)
WBC: 18.8 10*3/uL — ABNORMAL HIGH (ref 4.0–10.5)
nRBC: 0 % (ref 0.0–0.2)

## 2023-01-02 LAB — TYPE AND SCREEN
ABO/RH(D): O POS
Antibody Screen: NEGATIVE
Donor AG Type: NEGATIVE
Donor AG Type: NEGATIVE
Unit division: 0
Unit division: 0

## 2023-01-02 LAB — RENAL FUNCTION PANEL
Albumin: 1.9 g/dL — ABNORMAL LOW (ref 3.5–5.0)
Anion gap: 10 (ref 5–15)
BUN: 30 mg/dL — ABNORMAL HIGH (ref 6–20)
CO2: 26 mmol/L (ref 22–32)
Calcium: 8.3 mg/dL — ABNORMAL LOW (ref 8.9–10.3)
Chloride: 100 mmol/L (ref 98–111)
Creatinine, Ser: 4.49 mg/dL — ABNORMAL HIGH (ref 0.44–1.00)
GFR, Estimated: 11 mL/min — ABNORMAL LOW (ref >60)
Glucose, Bld: 129 mg/dL — ABNORMAL HIGH (ref 70–99)
Phosphorus: 3.7 mg/dL (ref 2.5–4.6)
Potassium: 3.2 mmol/L — ABNORMAL LOW (ref 3.5–5.1)
Sodium: 136 mmol/L (ref 135–145)

## 2023-01-02 LAB — BPAM RBC
Blood Product Expiration Date: 202411192359
Blood Product Expiration Date: 202411192359
ISSUE DATE / TIME: 202410231324
Unit Type and Rh: 5100
Unit Type and Rh: 5100

## 2023-01-02 LAB — GLUCOSE, CAPILLARY
Glucose-Capillary: 162 mg/dL — ABNORMAL HIGH (ref 70–99)
Glucose-Capillary: 71 mg/dL (ref 70–99)
Glucose-Capillary: 109 mg/dL — ABNORMAL HIGH (ref 70–99)
Glucose-Capillary: 90 mg/dL (ref 70–99)

## 2023-01-02 IMAGING — DX DG ABDOMEN 1V
2 series · 2 of 2 positions shown · non-contrast
Comparison: CT abdomen and pelvis 11/20/2020.

CLINICAL DATA: Nausea and vomiting with abdominal pain.

EXAM:
ABDOMEN - 1 VIEW

[abdomen kub (1 of 2)]
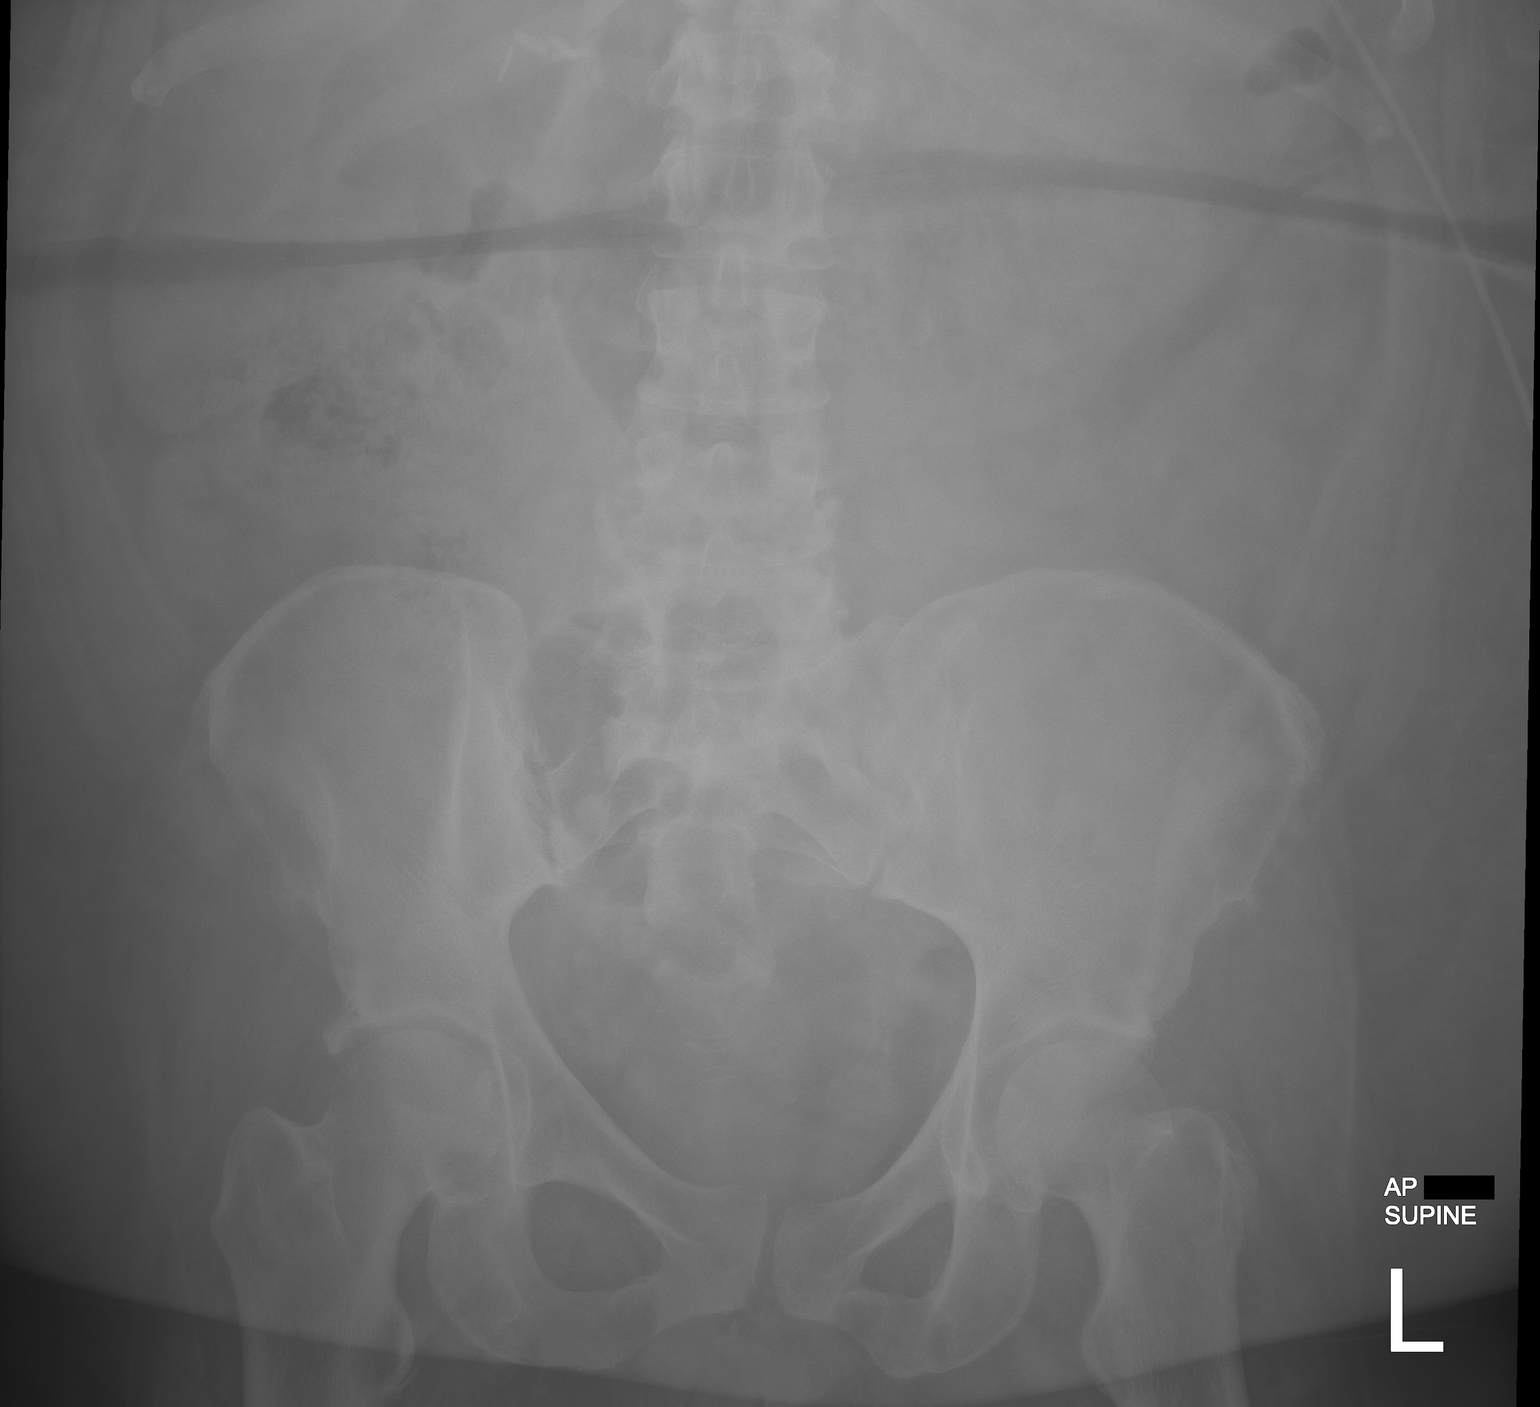

[abdomen kub (2 of 2)]
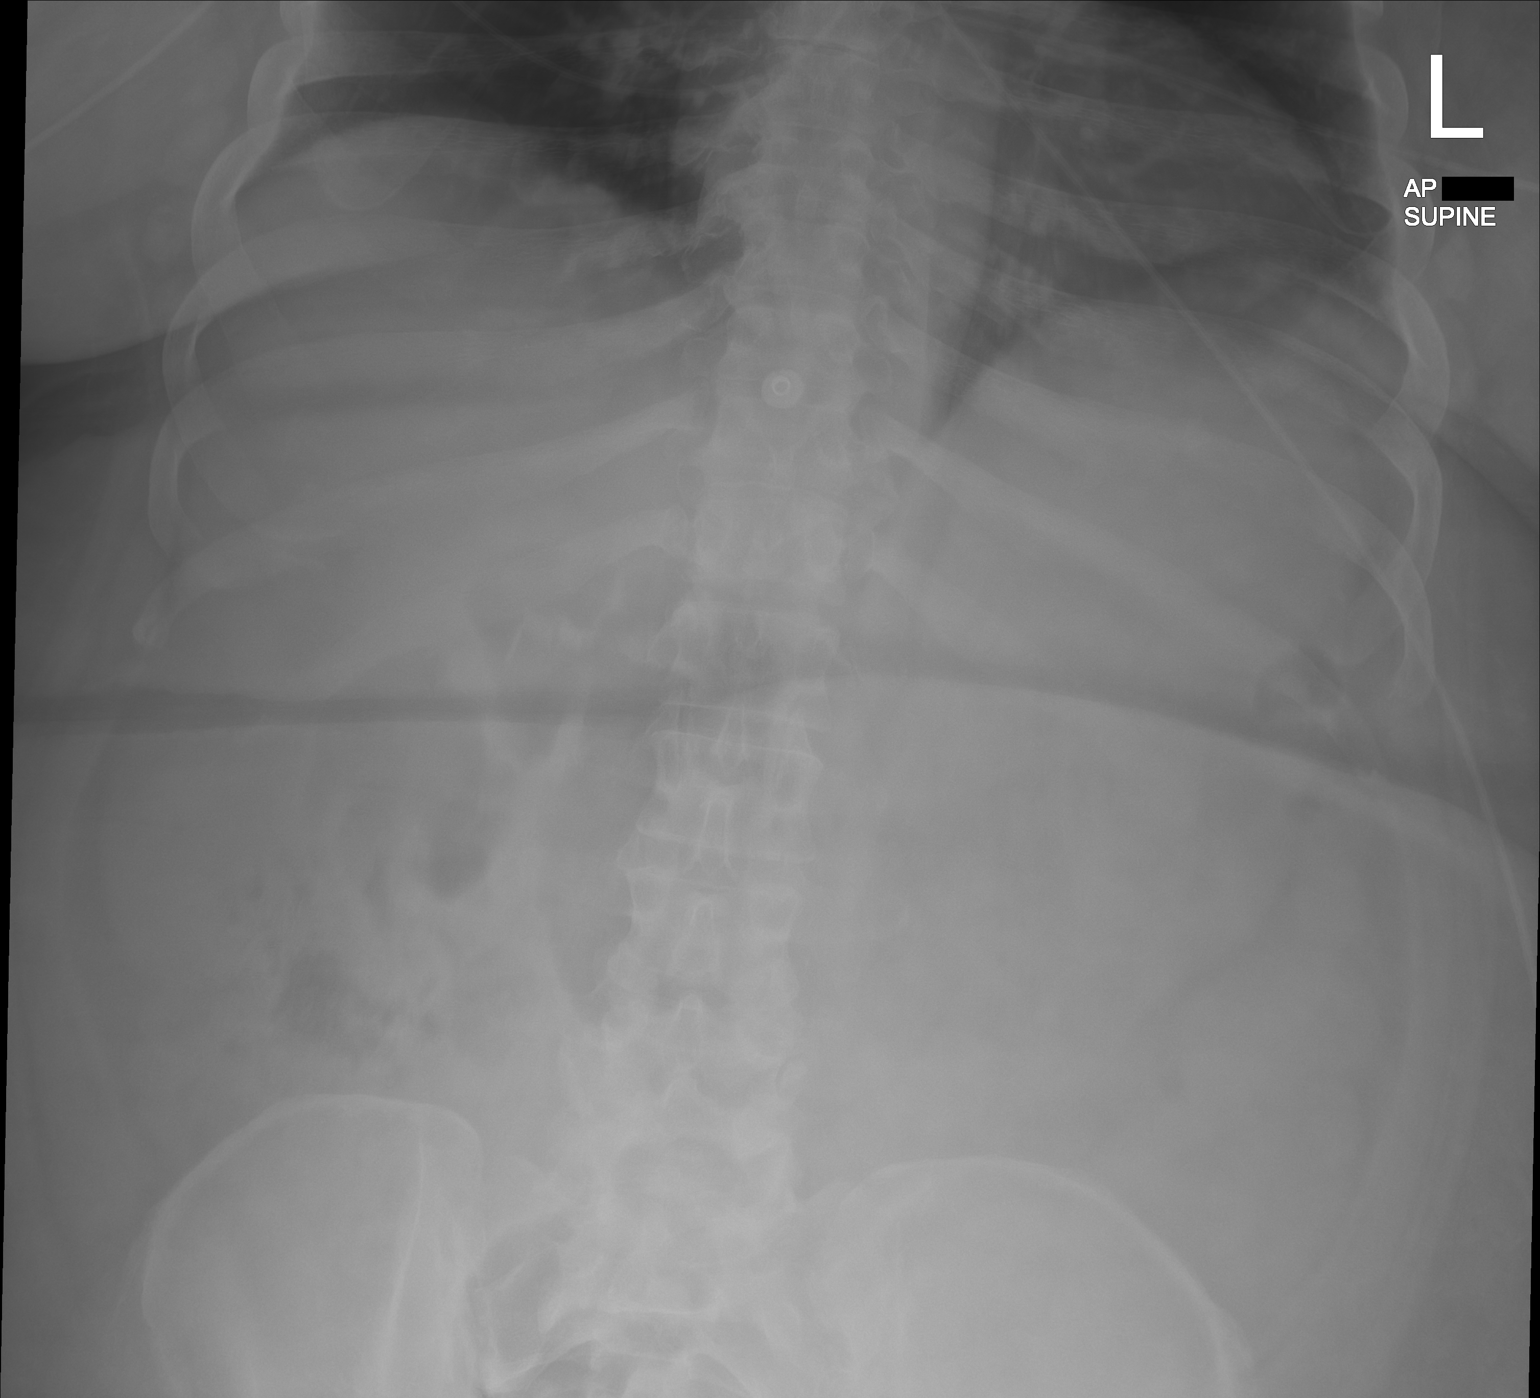

[2 of 2 positions shown; findings below may reference images not displayed]

FINDINGS: The bowel gas pattern is normal. No radio-opaque calculi or other
significant radiographic abnormality are seen. There are surgical
clips in the right abdomen.
IMPRESSION: Negative.

## 2023-01-02 MED ORDER — VALBENAZINE TOSYLATE 40 MG PO CAPS
40.0000 mg | ORAL_CAPSULE | Freq: Every day | ORAL | Status: DC
  Administered 2023-01-02 – 2023-01-10 (×9): 40 mg via ORAL
  Filled 2023-01-02 (×9): qty 1

## 2023-01-02 MED ORDER — HYDROXYZINE HCL 25 MG PO TABS
25.0000 mg | ORAL_TABLET | Freq: Three times a day (TID) | ORAL | Status: DC | PRN
  Administered 2023-01-03 – 2023-01-10 (×5): 25 mg via ORAL
  Filled 2023-01-02 (×6): qty 1

## 2023-01-02 MED ORDER — BENZTROPINE MESYLATE 1 MG PO TABS: 1.0000 mg | ORAL_TABLET | Freq: Two times a day (BID) | ORAL | Status: DC

## 2023-01-02 NOTE — Progress Notes (Signed)
Pt received 2mg  of IV Dilaudid for severe pain.

## 2023-01-02 NOTE — Consult Note (Signed)
WOC Nurse Consult Note: Patient has been followed by Dr. Lajoyce Corners for wound L heel; last note by him 12/07/2022 states this wound has full healed; patient had R AKA this admission  Reason for Consult: L heel wound  Wound type: healed full thickness wound L heel  Pressure Injury POA: Yes Measurement: 4 x 6 cm eschar medial heel, 3 cm x 3 cm black eschar lateral heel  Wound bed: as above  Drainage (amount, consistency, odor) dry at this visit  Periwound: peeling skin  Dressing procedure/placement/frequency: Clean entire L heel with Vashe wound cleanser Hart [place] [phone]) and apply Xeroform gauze Hart [place] [phone]) to heel daily, cover with dry gauze, Kerlix and place in boot that was provided to patient by Dr. Lajoyce Corners.   Patient should continue care with Dr. Lajoyce Corners as outpatient.   WOC team will not follow. Re-consult if further needs arise.   Thank you,    Priscella Mann MSN, RN-BC, [employer] [phone]

## 2023-01-02 NOTE — Progress Notes (Signed)
[facility] Progress Note   Subjective:  Seen in room - looking better each day. Says slept ok last night. No CP/dyspnea.  Objective Vitals:   01/01/23 2043 01/02/23 0453 01/02/23 0748 01/02/23 0927  BP: 133/82 (!) 131/92 127/74 (!) 151/89  Pulse: (!) 103 95 92 96  Resp: 16 18 18 16   Temp: 98 F (36.7 C) 97.6 F (36.4 C)  97.8 F (36.6 C)  TempSrc: Oral Oral    SpO2: 94% 100% 100% 100%  Weight:      Height:       Physical Exam General: Chronically ill appearing woman, NAD. Room air. Heart: RRR; no murmur Lungs: CTA anteriorly, no wheezing Abdomen: soft Extremities: R BKA in hard brace, trace LLE edema with heel bandaged Dialysis Access: TDC in R chest, L AVF + bruit, two glued incisions  Additional Objective Labs: Basic Metabolic Panel: Recent Labs  Lab 12/31/22 0425 12/31/22 1505 01/02/23 0443  NA 136 133* 136  K 3.1* 3.5 3.2*  CL 100 99 100  CO2 25 25 26   GLUCOSE 140* 157* 129*  BUN 24* 26* 30*  CREATININE 4.20* 4.19* 4.49*  CALCIUM 7.9* 7.7* 8.3*  PHOS 4.1 4.1 3.7   Liver Function Tests: Recent Labs  Lab 12/31/22 0425 12/31/22 1505 01/02/23 0443  ALBUMIN 1.6* 1.7* 1.9*   CBC: Recent Labs  Lab 12/30/22 0426 12/31/22 0425 12/31/22 1505 01/01/23 1736 01/02/23 0443  WBC 20.6* 16.4* 18.2* 18.9* 18.8*  HGB 8.0* 7.3* 7.6* 7.7* 7.8*  HCT 26.7* 24.4* 25.9* 25.1* 25.8*  MCV 95.0 96.8 95.9 93.0 95.9  PLT 334 328 362 387 413*   Studies/Results: CT HEAD WO CONTRAST ( )  Result Date: 01/01/2023 CLINICAL DATA:  Acute neurologic deficit EXAM: CT HEAD WITHOUT CONTRAST TECHNIQUE: Contiguous axial images were obtained from the base of the skull through the vertex without intravenous contrast. RADIATION DOSE REDUCTION: This exam was performed according to the departmental dose-optimization program which includes automated exposure control, adjustment of the mA and/or kV according to patient size and/or use of iterative reconstruction  technique. COMPARISON:  05/29/2022 FINDINGS: Brain: No mass,hemorrhage or extra-axial collection. Normal appearance of the parenchyma and CSF spaces. Vascular: Atherosclerotic calcification of the internal carotid arteries at the skull base. No abnormal hyperdensity of the major intracranial arteries or dural venous sinuses. Skull: The visualized skull base, calvarium and extracranial soft tissues are normal. Sinuses/Orbits: No fluid levels or advanced mucosal thickening of the visualized paranasal sinuses. No mastoid or middle ear effusion. Normal orbits. IMPRESSION: No acute intracranial abnormality. Electronically Signed   By: Deatra Robinson M.D.   On: 01/01/2023 22:54    Medications:  anticoagulant sodium citrate      (feeding supplement) PROSource Plus  30 mL Oral BID BM   acetaminophen  650 mg Oral Q6H   acidophilus  2 capsule Oral Daily   allopurinol  100 mg Oral BID   vitamin C  1,000 mg Oral Daily   atorvastatin  10 mg Oral QHS   darbepoetin (ARANESP) injection - DIALYSIS  200 mcg Subcutaneous Q Fri-1800   docusate sodium  100 mg Oral Daily   doxercalciferol  7 mcg Intravenous Q M,W,F-HD   feeding supplement (NEPRO CARB STEADY)  237 mL Oral BID BM   ferric citrate  420 mg Oral TID WC   folic acid  1 mg Oral Daily   Gerhardt's butt cream   Topical BID   insulin aspart  0-6 Units Subcutaneous TID WC   insulin aspart  2 Units Subcutaneous TID WC   insulin glargine-yfgn  8 Units Subcutaneous QHS   midodrine  5 mg Oral TID WC   mupirocin ointment  1 Application Nasal BID   nutrition supplement (JUVEN)  1 packet Oral BID BM   pantoprazole  40 mg Oral Daily   sucralfate  1 g Oral TID WC & HS   zinc sulfate  220 mg Oral Daily    Dialysis Orders: [facility]  4hr, 180dialyzer, 400/800, EDW 126.9kg, 3K/2.5Ca bath, TDC in R chest, 1st stage LUE BVT 06/2022 - mircera 225 mcg last 10/5 - hectorol 7 mcg   Problem/Plan: ESRD: Continue HD on MWF schedule - next 10/28. Hypokalemia: Using 4K  bath with HD. Right foot osteomyelitis/abscess s/p R BKA on 12/29/22: Per ortho.  BP/volume: BP stable, edema improving. Will have lower EDW on d/c s/p amputation. Anemia (ABLA/GIB + ESRD): S/p EGD Multiple gastric AVMs, treated with APC and capsule endo 10/17 showing polyps only. Colonoscopy recommended. Getting Aranesp q Friday here. Hgb 7.8 - stable. Secondary Hyperparathyroidism/Hyperphosphatemia: CorrCa/Phos good, continue meds. Vascular access: TDC in place, s/p AVF revision/transposition 10/25. AMS/acute toxic encephalopathy (resolved): Med induced, likely. MRI without acute findings.  T2DM: Insulin per primary. Nutrition: Alb very low, continue supplements.  Ozzie Hoyle, PA-C 01/02/2023, 9:42 AM  [employer]

## 2023-01-02 NOTE — Evaluation (Signed)
Clinical/Bedside Swallow Evaluation Patient Details  Name: Deborah [name] MRN: [id number] Date of Birth: 1965-08-26  Today's Date: 01/02/2023 Time: SLP Start Time (ACUTE ONLY): 1057 SLP Stop Time (ACUTE ONLY): 1115 SLP Time Calculation (min) (ACUTE ONLY): 18 min  Past Medical History:  Past Medical History:  Diagnosis Date   Acute back pain with sciatica, left    Acute back pain with sciatica, right    AKI (acute kidney injury) (HCC)    Anemia, unspecified    Cancer (HCC)    Carcinoid tumor of duodenum    Chest pain with normal coronary angiography 2019   Chronic kidney disease, stage 3b (HCC)    Chronic pain    Chronic systolic CHF (congestive heart failure) (HCC)    Diabetes mellitus    DKA (diabetic ketoacidosis) (HCC)    Drug-seeking behavior    21 hospitalizations and 14 CT a/p in 2 years for N/V and abdominal pain, demanding only IV dilaudid   Elevated troponin    chronic   Esophageal reflux    Fibromyalgia    Gastric ulcer    Gastroparesis    Gout    Hyperlipidemia    Hypertension    Hypokalemia    Hypomagnesemia    Lumbosacral stenosis    LVH (left ventricular hypertrophy)    Morbid obesity (HCC)    NICM (nonischemic cardiomyopathy) (HCC)    PAF (paroxysmal atrial fibrillation) (HCC)    Stroke (HCC) 02/2011   Thrombocytosis    Vitamin B12 deficiency anemia    Past Surgical History:  Past Surgical History:  Procedure Laterality Date   ABDOMINAL AORTOGRAM W/LOWER EXTREMITY N/A 11/29/2022   Procedure: ABDOMINAL AORTOGRAM W/LOWER EXTREMITY;  Surgeon: Daria Pastures, MD;  Location: [facility];  Service: Cardiovascular;  Laterality: N/A;   AMPUTATION Right 12/29/2022   Procedure: RIGHT BELOW KNEE AMPUTATION;  Surgeon: Nadara Mustard, MD;  Location: [facility] OR;  Service: Orthopedics;  Laterality: Right;   AV FISTULA PLACEMENT Left 06/30/2022   Procedure: LEFT BRACHIOCEPHALIC ARTERIOVENOUS (AV) FISTULA CREATION;  Surgeon: Cephus Shelling, MD;   Location: [facility] OR;  Service: Vascular;  Laterality: Left;   BIOPSY  07/27/2019   Procedure: BIOPSY;  Surgeon: Vida Rigger, MD;  Location: [facility];  Service: Endoscopy;;   BIOPSY  07/30/2019   Procedure: BIOPSY;  Surgeon: Kathi Der, MD;  Location: [facility];  Service: Gastroenterology;;   CATARACT EXTRACTION  01/2014   CHOLECYSTECTOMY     COLONOSCOPY WITH PROPOFOL N/A 07/30/2019   Procedure: COLONOSCOPY WITH PROPOFOL;  Surgeon: Kathi Der, MD;  Location: [facility];  Service: Gastroenterology;  Laterality: N/A;   ESOPHAGOGASTRODUODENOSCOPY N/A 07/27/2019   Procedure: ESOPHAGOGASTRODUODENOSCOPY (EGD);  Surgeon: Vida Rigger, MD;  Location: [facility];  Service: Endoscopy;  Laterality: N/A;   ESOPHAGOGASTRODUODENOSCOPY N/A 07/26/2020   Procedure: ESOPHAGOGASTRODUODENOSCOPY (EGD);  Surgeon: Willis Modena, MD;  Location: [facility];  Service: Endoscopy;  Laterality: N/A;   ESOPHAGOGASTRODUODENOSCOPY (EGD) WITH PROPOFOL N/A 08/02/2019   Procedure: ESOPHAGOGASTRODUODENOSCOPY (EGD) WITH PROPOFOL;  Surgeon: Kathi Der, MD;  Location: [facility];  Service: Gastroenterology;  Laterality: N/A;   ESOPHAGOGASTRODUODENOSCOPY (EGD) WITH PROPOFOL N/A 12/23/2022   Procedure: ESOPHAGOGASTRODUODENOSCOPY (EGD) WITH PROPOFOL;  Surgeon: Shellia Cleverly, DO;  Location: [facility];  Service: Gastroenterology;  Laterality: N/A;   FISTULA SUPERFICIALIZATION Left 12/31/2022   Procedure: LEFT ARM FISTULA TRANSPOSITION;  Surgeon: Maeola Harman, MD;  Location: [facility] OR;  Service: Vascular;  Laterality: Left;   GIVENS CAPSULE STUDY N/A 12/23/2022   Procedure: GIVENS CAPSULE  STUDY;  Surgeon: Shellia Cleverly, DO;  Location: [facility];  Service: Gastroenterology;  Laterality: N/A;   HEMOSTASIS CLIP PLACEMENT  08/02/2019   Procedure: HEMOSTASIS CLIP PLACEMENT;  Surgeon: Kathi Der, MD;  Location: [facility];  Service: Gastroenterology;;   HOT HEMOSTASIS N/A 12/23/2022    Procedure: HOT HEMOSTASIS (ARGON PLASMA COAGULATION/BICAP);  Surgeon: Shellia Cleverly, DO;  Location: [facility];  Service: Gastroenterology;  Laterality: N/A;   IR FLUORO GUIDE CV LINE RIGHT  06/24/2022   IR US GUIDE VASC ACCESS RIGHT  06/24/2022   POLYPECTOMY  07/30/2019   Procedure: POLYPECTOMY;  Surgeon: Kathi Der, MD;  Location: [facility];  Service: Gastroenterology;;   POLYPECTOMY  08/02/2019   Procedure: POLYPECTOMY;  Surgeon: Kathi Der, MD;  Location: [facility];  Service: Gastroenterology;;   HPI:  57 year old with a history of ESRD on HD MWF, PAF on Eliquis, diastolic CHF, HTN, DM2, malignant duodenal carcinoid, NAFLD, morbid obesity, and a chronic right ankle ulcer who had recently been admitted 9/16-9/24/2024 at which time she underwent an angiogram noting adequate perfusion to her right foot with ultimate disposition being treatment for gout of the right ankle.  She returned to the ER 10/8 and was found to have a hemoglobin of 5.8 with crampy abdominal pain and a history of BRBPR. Since the time of her admission she has experienced recurrent GI bleeding.  Ultimately she was taken for an EGD and capsule endoscopy.  Additionally she has been confirmed to have osteomyelitis of the right ankle s/p right BKA 10/23. Dx also with acute encephalopathy of unclear origin, MRI negative.    Assessment / Plan / Recommendation  Clinical Impression  Bedside swallow evaluation complete. No significant findings noted on OME as far as strength, coordination, and ROM is concerned however patient does have abnormal, spontaneous and repetitive lingual movement at baseline and during po intake. Thin liquids consumed without s/s of aspiration. Patient was very poor attention span and decreased reasoning abilities. Even with max cueing, it was difficult for patient to maintain attention on task. She generally declined most pos offered for a variety of reasons, one of which was that she was  worried about her throat and about choking but was unable to specify causes or provide additional details. Sandwich masticated using only lateral teeth and utlimately expectorated for uknown reason. Patient presents with what this SLP suspects is normal oropharyngeal swallowing physiology but with AMS causing decreased po intake and atypical presentation. No changes needed to diet at this time. She appears to ber protecting her airway. Given limitations of study however, SLP will f/u briefly to ensure patient is on least restrictive diet. SLP Visit Diagnosis: Dysphagia, unspecified (R13.10)       Diet Recommendation Regular;Thin liquid    Liquid Administration via: Cup;Straw Medication Administration: Whole meds with liquid (or as tolerated) Supervision: Patient able to self feed Compensations: Slow rate;Small sips/bites Postural Changes: Seated upright at 90 degrees    Other  Recommendations Oral Care Recommendations: Oral care BID    Recommendations for follow up therapy are one component of a multi-disciplinary discharge planning process, led by the attending physician.  Recommendations may be updated based on patient status, additional functional criteria and insurance authorization.  Follow up Recommendations No SLP follow up      Assistance Recommended at Discharge    Functional Status Assessment Patient has had a recent decline in their functional status and demonstrates the ability to make significant improvements in function in a reasonable and predictable amount  of time.  Frequency and Duration min 1 x/week  1 week       Prognosis Prognosis for improved oropharyngeal function: Good Barriers to Reach Goals: Cognitive deficits      Swallow Study   General HPI: 57 year old with a history of ESRD on HD MWF, PAF on Eliquis, diastolic CHF, HTN, DM2, malignant duodenal carcinoid, NAFLD, morbid obesity, and a chronic right ankle ulcer who had recently been admitted 9/16-9/24/2024  at which time she underwent an angiogram noting adequate perfusion to her right foot with ultimate disposition being treatment for gout of the right ankle.  She returned to the ER 10/8 and was found to have a hemoglobin of 5.8 with crampy abdominal pain and a history of BRBPR. Since the time of her admission she has experienced recurrent GI bleeding.  Ultimately she was taken for an EGD and capsule endoscopy.  Additionally she has been confirmed to have osteomyelitis of the right ankle s/p right BKA 10/23. Dx also with acute encephalopathy of unclear origin, MRI negative. Type of Study: Bedside Swallow Evaluation Previous Swallow Assessment: none Diet Prior to this Study: Regular;Thin liquids (Level 0) Temperature Spikes Noted: No Respiratory Status: Room air History of Recent Intubation: No Behavior/Cognition: Alert;Cooperative;Requires cueing;Distractible Oral Cavity Assessment: Within Functional Limits Oral Care Completed by SLP: No Oral Cavity - Dentition: Adequate natural dentition Vision: Functional for self-feeding Self-Feeding Abilities: Able to feed self Patient Positioning: Upright in bed Baseline Vocal Quality: Normal Volitional Cough: Cognitively unable to elicit Volitional Swallow: Able to elicit    Oral/Motor/Sensory Function Overall Oral Motor/Sensory Function: Within functional limits (except some abnormal spontaneous, repetitive lingual movements)   Ice Chips Ice chips: Not tested   Thin Liquid Thin Liquid: Within functional limits Presentation: Straw;Self Fed    Nectar Thick Nectar Thick Liquid: Not tested   Honey Thick Honey Thick Liquid: Not tested   Puree Puree: Not tested   Solid     Solid: Impaired Presentation: Self Fed Oral Phase Impairments: Impaired mastication Oral Phase Functional Implications: Prolonged oral transit;Impaired mastication (expectorated)     Kamilo Och MA, CCC-SLP  Deborah [name] 01/02/2023,11:30 AM

## 2023-01-02 NOTE — Progress Notes (Signed)
Deborah [name]  YNW:[id number] DOB: 1965-05-26 DOA: 12/14/2022 PCP: Pcp, No    Brief Narrative:  57 year old with a history of ESRD on HD MWF, PAF on Eliquis, diastolic CHF, HTN, DM2, malignant duodenal carcinoid, NAFLD, morbid obesity, and a chronic right ankle ulcer who had recently been admitted 9/16-9/24/2024 at which time she underwent an angiogram noting adequate perfusion to her right foot with ultimate disposition being treatment for gout of the right ankle.  She returned to the ER 10/8 and was found to have a hemoglobin of 5.8 with crampy abdominal pain and a history of BRBPR.  Since the time of her admission she has experienced recurrent GI bleeding.  Ultimately she was taken for an EGD and capsule endoscopy.  Additionally she has been confirmed to have osteomyelitis of the right ankle with plans for right BKA 10/23.  Goals of Care:   Code Status: Full Code   DVT prophylaxis: SCD's Start: 12/29/22 2334   Interim Hx: No acute events recorded overnight.  She is afebrile with stable vital signs and oxygen saturation 100% room air.  CBGs are well-controlled.  Hemoglobin is stable.  CT head 10/26 revealed no acute abnormalities.  Continuous rolling of tongue persists. Speech is somewhat pressured today. She denies any new complaints.   Assessment & Plan:  Acute encephalopathy of unclear etiology -previously due to opiate medication during this admission MRI brain unrevealing 10/9 - perhaps narcotics are to blame again - CT head 10/26 with no acute findings - minimized meds that could lead to dystonic reaction given tongue rolling behavior  Possible tardive dyskinesia  Continuous rolling of tongue persists - give trial of ingrezza today - antipsychotics have been stopped for now   Acute anemia due to GI bleeding/blood loss on chronic anemia of ESRD Has required 4 units PRBC total during this hospital stay - EGD noted minimal reflux esophagitis with normal appearing duodenum and 3  small AVMs with 1 having an adherent clot - some degree of rebleeding expected if she is to resume anticoagulation - not a candidate for long-term Carafate due to ESRD (will need to stop after 2 weeks max) - outpatient colonoscopy is recommended - hemoglobin stable at present  Recent Labs  Lab 12/30/22 0426 12/31/22 0425 12/31/22 1505 01/01/23 1736 01/02/23 0443  HGB 8.0* 7.3* 7.6* 7.7* 7.8*     ESRD on HD MWF Ongoing care per Nephrology team - was taken to the OR 10/25 for superficialization of her L brachiocephalic AV fistula   Chronic paroxysmal atrial fibrillation previously on DOAC Eliquis stopped and do not plan to resume given ongoing GI bleeding and known AVMs - rate well-controlled presently  Chronic right heel wound with acutely diagnosed osteomyelitis No evidence of osteomyelitis on MRI last month -repeat MRI 10/14 consistent with early osteomyelitis - Dr. Lajoyce Corners performed BKA 10/23 -postop care has been uncomplicated thus far  DM2 CBG within goal at present  Duodenal carcinoid Status post resection  Chronic diastolic CHF Volume management per HD - appears euvolemic presently  Chronic back pain -lumbar spinal stenosis with neurogenic claudication Chronically on Dilaudid at home - reportedly wheelchair-bound for several months - MRI lumbar spine earlier this year noted lumbar spondylosis worsening since 2019 - evaluated by Neurosurgery March 2024 and not felt to be appropriate for surgical approach with suggestion to continue physical therapy - will need SNF at time of discharge  Morbid obesity with NAFLD - Body mass index is 43.06 kg/m.  Mood disorder Continue usual duloxetine -diazepam  added this admission  Gout Continue usual allopurinol   Family Communication: No family present at time of exam  Disposition: Medically ready for discharge to SNF when bed available   Objective: Blood pressure (!) 151/89, pulse 96, temperature 97.8 F (36.6 C), resp. rate 16,  height 5\' 6"  (1.676 m), weight 121 kg, last menstrual period 10/10/2012, SpO2 100%.  Intake/Output Summary (Last 24 hours) at 01/02/2023 0938 Last data filed at 01/02/2023 0454 Gross per 24 hour  Intake 0 ml  Output 0 ml  Net 0 ml   Filed Weights   12/31/22 1454 12/31/22 1800 01/01/23 0500  Weight: 121.9 kg 120.3 kg 121 kg    Examination: General: No acute respiratory distress -alert and conversant though confused Lungs: Clear to auscultation bilaterally without wheezing Cardiovascular: RRR without murmur Abdomen: Nontender, nondistended, soft, bowel sounds positive, no rebound, no ascites, no appreciable mass Extremities: No significant edema of the extremities Neuro: continues to exhibit nearly continuous rolling of the tongue  CBC: Recent Labs  Lab 12/31/22 1505 01/01/23 1736 01/02/23 0443  WBC 18.2* 18.9* 18.8*  HGB 7.6* 7.7* 7.8*  HCT 25.9* 25.1* 25.8*  MCV 95.9 93.0 95.9  PLT 362 387 413*   Basic Metabolic Panel: Recent Labs  Lab 12/31/22 0425 12/31/22 1505 01/02/23 0443  NA 136 133* 136  K 3.1* 3.5 3.2*  CL 100 99 100  CO2 25 25 26   GLUCOSE 140* 157* 129*  BUN 24* 26* 30*  CREATININE 4.20* 4.19* 4.49*  CALCIUM 7.9* 7.7* 8.3*  PHOS 4.1 4.1 3.7   GFR: Estimated Creatinine Clearance: 18.1 mL/min (A) (by C-G formula based on SCr of 4.49 mg/dL (H)).   Scheduled Meds:  (feeding supplement) PROSource Plus  30 mL Oral BID BM   acetaminophen  650 mg Oral Q6H   acidophilus  2 capsule Oral Daily   allopurinol  100 mg Oral BID   vitamin C  1,000 mg Oral Daily   atorvastatin  10 mg Oral QHS   darbepoetin (ARANESP) injection - DIALYSIS  200 mcg Subcutaneous Q Fri-1800   docusate sodium  100 mg Oral Daily   doxercalciferol  7 mcg Intravenous Q M,W,F-HD   feeding supplement (NEPRO CARB STEADY)  237 mL Oral BID BM   ferric citrate  420 mg Oral TID WC   folic acid  1 mg Oral Daily   Gerhardt's butt cream   Topical BID   insulin aspart  0-6 Units Subcutaneous  TID WC   insulin aspart  2 Units Subcutaneous TID WC   insulin glargine-yfgn  8 Units Subcutaneous QHS   midodrine  5 mg Oral TID WC   mupirocin ointment  1 Application Nasal BID   nutrition supplement (JUVEN)  1 packet Oral BID BM   pantoprazole  40 mg Oral Daily   sucralfate  1 g Oral TID WC & HS   zinc sulfate  220 mg Oral Daily      LOS: 19 days   Lonia Blood, MD [facility] Office  [phone] Pager - Text Page per Loretha Stapler  If 7PM-7AM, please contact night-coverage per Amion 01/02/2023, 9:38 AM

## 2023-01-03 ENCOUNTER — Telehealth: Payer: Self-pay | Admitting: Orthopedic Surgery

## 2023-01-03 DIAGNOSIS — D649 Anemia, unspecified: Secondary | ICD-10-CM | POA: Diagnosis not present

## 2023-01-03 LAB — RENAL FUNCTION PANEL
Albumin: 1.8 g/dL — ABNORMAL LOW (ref 3.5–5.0)
Anion gap: 10 (ref 5–15)
BUN: 36 mg/dL — ABNORMAL HIGH (ref 6–20)
CO2: 24 mmol/L (ref 22–32)
Calcium: 7.8 mg/dL — ABNORMAL LOW (ref 8.9–10.3)
Chloride: 100 mmol/L (ref 98–111)
Creatinine, Ser: 4.72 mg/dL — ABNORMAL HIGH (ref 0.44–1.00)
GFR, Estimated: 10 mL/min — ABNORMAL LOW (ref >60)
Glucose, Bld: 116 mg/dL — ABNORMAL HIGH (ref 70–99)
Phosphorus: 3.9 mg/dL (ref 2.5–4.6)
Potassium: 3.2 mmol/L — ABNORMAL LOW (ref 3.5–5.1)
Sodium: 134 mmol/L — ABNORMAL LOW (ref 135–145)

## 2023-01-03 LAB — CBC
HCT: 27.3 % — ABNORMAL LOW (ref 36.0–46.0)
Hemoglobin: 8.3 g/dL — ABNORMAL LOW (ref 12.0–15.0)
MCH: 29.3 pg (ref 26.0–34.0)
MCHC: 30.4 g/dL (ref 30.0–36.0)
MCV: 96.5 fL (ref 80.0–100.0)
Platelets: 407 10*3/uL — ABNORMAL HIGH (ref 150–400)
RBC: 2.83 MIL/uL — ABNORMAL LOW (ref 3.87–5.11)
RDW: 16.2 % — ABNORMAL HIGH (ref 11.5–15.5)
WBC: 14.9 10*3/uL — ABNORMAL HIGH (ref 4.0–10.5)
nRBC: 0 % (ref 0.0–0.2)

## 2023-01-03 LAB — GLUCOSE, CAPILLARY
Glucose-Capillary: 83 mg/dL (ref 70–99)
Glucose-Capillary: 73 mg/dL (ref 70–99)

## 2023-01-03 LAB — SURGICAL PATHOLOGY

## 2023-01-03 MED ORDER — HYDROMORPHONE HCL 1 MG/ML IJ SOLN: 1.0000 mg | INTRAMUSCULAR | Status: DC | PRN

## 2023-01-03 NOTE — Telephone Encounter (Signed)
Called patient to reschedule appt on 10/29 for in the morning. Left message to give a call back.

## 2023-01-03 NOTE — Progress Notes (Signed)
[id number]  Deborah [name]  NFA:[id number] DOB: 12/19/1965 DOA: 12/14/2022 PCP: Pcp, No    Brief Narrative:  57 year old with a history of ESRD on HD MWF, PAF on Eliquis, diastolic CHF, HTN, DM2, malignant duodenal carcinoid, NAFLD, morbid obesity, and a chronic right ankle ulcer who had recently been admitted 9/16-9/24/2024 at which time she underwent an angiogram noting adequate perfusion to her right foot with ultimate disposition being treatment for gout of the right ankle.  She returned to the ER 10/8 and was found to have a hemoglobin of 5.8 with crampy abdominal pain and a history of BRBPR.  Since the time of her admission she has experienced recurrent GI bleeding.  Ultimately she was taken for an EGD and capsule endoscopy.  Additionally she has been confirmed to have osteomyelitis of the right ankle with plans for right BKA 10/23.  Goals of Care:   Code Status: Full Code   DVT prophylaxis: SCD's Start: 12/29/22 2334   Interim Hx: Afebrile.  Vital signs stable.  No acute events overnight.  More calm at the time of my visit today.  Denies any new complaints stating her pain is controlled.  Tongue rolling behavior not appreciable at this time.  Assessment & Plan:  Acute encephalopathy of unclear etiology -previously due to opiate medication during this admission MRI brain unrevealing 10/9 - perhaps narcotics are to blame again - CT head 10/26 with no acute findings - minimized meds that could lead to dystonic reaction given tongue rolling behavior -appears to have improved/stabilized today  Possible tardive dyskinesia  Continuous rolling of tongue noted for ~48hrs - trial of ingrezza started 10/27 - antipsychotics/antidepressants have been stopped for now -appears resolved at time of visit today  Acute anemia due to GI bleeding/blood loss on chronic anemia of ESRD Has required 4 units PRBC total during this hospital stay - EGD noted minimal reflux esophagitis with normal appearing  duodenum and 3 small AVMs with 1 having an adherent clot - some degree of rebleeding expected if she is to resume anticoagulation - not a candidate for long-term Carafate due to ESRD (will need to stop after 2 weeks max) - outpatient colonoscopy is recommended - hemoglobin improving at this time  Recent Labs  Lab 12/31/22 0425 12/31/22 1505 01/01/23 1736 01/02/23 0443 01/03/23 0425  HGB 7.3* 7.6* 7.7* 7.8* 8.3*     ESRD on HD MWF Ongoing care per Nephrology team - was taken to the OR 10/25 for superficialization of her L brachiocephalic AV fistula   Chronic paroxysmal atrial fibrillation previously on DOAC Eliquis stopped and do not plan to resume given ongoing GI bleeding and known AVMs - rate well-controlled presently  Chronic right heel wound with acutely diagnosed osteomyelitis No evidence of osteomyelitis on MRI last month -repeat MRI 10/14 consistent with early osteomyelitis - Dr. Lajoyce Corners performed BKA 10/23 - postop care per orthopedic surgery  DM2 CBG within goal at present  Duodenal carcinoid Status post resection  Chronic diastolic CHF Volume management per HD - appears euvolemic presently  Chronic back pain -lumbar spinal stenosis with neurogenic claudication Chronically on Dilaudid at home - reportedly wheelchair-bound for several months - MRI lumbar spine earlier this year noted lumbar spondylosis worsening since 2019 - evaluated by Neurosurgery March 2024 and not felt to be appropriate for surgical approach with suggestion to continue physical therapy - will need SNF at time of discharge  Morbid obesity with NAFLD - Body mass index is 43.06 kg/m.  Mood disorder Continue usual duloxetine -  diazepam added this admission  Gout Continue usual allopurinol   Family Communication: No family present at time of exam  Disposition: Medically ready for discharge to SNF when bed available   Objective: Blood pressure 137/79, pulse 98, temperature 98.1 F (36.7 C), resp.  rate 17, height 5\' 6"  (1.676 m), weight 121 kg, last menstrual period 10/10/2012, SpO2 99%.  Intake/Output Summary (Last 24 hours) at 01/03/2023 0753 Last data filed at 01/03/2023 0423 Gross per 24 hour  Intake 300 ml  Output 0 ml  Net 300 ml   Filed Weights   12/31/22 1454 12/31/22 1800 01/01/23 0500  Weight: 121.9 kg 120.3 kg 121 kg    Examination: General: No acute respiratory distress  Lungs: Clear to auscultation bilaterally Cardiovascular: RRR without murmur Abdomen: Nontender, nondistended, soft, bowel sounds positive, no rebound, no ascites, no appreciable mass Extremities: No significant edema of the extremities  CBC: Recent Labs  Lab 01/01/23 1736 01/02/23 0443 01/03/23 0425  WBC 18.9* 18.8* 14.9*  HGB 7.7* 7.8* 8.3*  HCT 25.1* 25.8* 27.3*  MCV 93.0 95.9 96.5  PLT 387 413* 407*   Basic Metabolic Panel: Recent Labs  Lab 12/31/22 0425 12/31/22 1505 01/02/23 0443  NA 136 133* 136  K 3.1* 3.5 3.2*  CL 100 99 100  CO2 25 25 26   GLUCOSE 140* 157* 129*  BUN 24* 26* 30*  CREATININE 4.20* 4.19* 4.49*  CALCIUM 7.9* 7.7* 8.3*  PHOS 4.1 4.1 3.7   GFR: Estimated Creatinine Clearance: 18.1 mL/min (A) (by C-G formula based on SCr of 4.49 mg/dL (H)).   Scheduled Meds:  (feeding supplement) PROSource Plus  30 mL Oral BID BM   acetaminophen  650 mg Oral Q6H   acidophilus  2 capsule Oral Daily   allopurinol  100 mg Oral BID   vitamin C  1,000 mg Oral Daily   atorvastatin  10 mg Oral QHS   darbepoetin (ARANESP) injection - DIALYSIS  200 mcg Subcutaneous Q Fri-1800   docusate sodium  100 mg Oral Daily   doxercalciferol  7 mcg Intravenous Q M,W,F-HD   feeding supplement (NEPRO CARB STEADY)  237 mL Oral BID BM   ferric citrate  420 mg Oral TID WC   folic acid  1 mg Oral Daily   Gerhardt's butt cream   Topical BID   insulin aspart  0-6 Units Subcutaneous TID WC   insulin aspart  2 Units Subcutaneous TID WC   insulin glargine-yfgn  8 Units Subcutaneous QHS    nutrition supplement (JUVEN)  1 packet Oral BID BM   pantoprazole  40 mg Oral Daily   sucralfate  1 g Oral TID WC & HS   valbenazine  40 mg Oral Daily   zinc sulfate  220 mg Oral Daily      LOS: 20 days   Lonia Blood, MD [facility] Office  [phone] Pager - Text Page per Loretha Stapler  If 7PM-7AM, please contact night-coverage per Amion 01/03/2023, 7:53 AM

## 2023-01-03 NOTE — Progress Notes (Signed)
Speech Language Pathology Treatment: Dysphagia  Patient Details Name: TASHI SHOBER MRN: [id number] DOB: [birth year] Today's Date: 01/03/2023 Time: 4098-1191 SLP Time Calculation (min) (ACUTE ONLY): 24 min  Assessment / Plan / Recommendation Clinical Impression  Pt seen for f/u dysphagia tx/diet check with various consistencies given min modified independent cues for decreasing rate during oral intake.  Pt stated "I guess I am hungry!" And requesting A during intake.  Pt observed with disorganized chewing pattern (similar to what is observed in disorders such as tardive dyskinesia) during oral intake of soft solids (peaches) with exaggerated lateral chewing pattern noted and pt commenting on during intake stating "My chewing is different."  No overt s/sx of aspiration noted during all intake of thin via cup, puree and soft solids.  Timely swallow observed and adequate oral/pharyngeal clearance noted.  Recommend continue current diet of Regular/thin with pt preferred foods d/t masticatory effort being decreased.  Education provided to pt re: general swallowing precautions with pt able to teach back during session.  ST will s/o in acute setting as pt has increased overall intake.  Please re-consult prn if symptoms continue/worsen during acute stay.  Thank you for this consult.   HPI HPI: 57 year old with a history of ESRD on HD MWF, PAF on Eliquis, diastolic CHF, HTN, DM2, malignant duodenal carcinoid, NAFLD, morbid obesity, and a chronic right ankle ulcer who had recently been admitted 9/16-9/24/2024 at which time she underwent an angiogram noting adequate perfusion to her right foot with ultimate disposition being treatment for gout of the right ankle. She returned to the ER 10/8 and was found to have a hemoglobin of 5.8 with crampy abdominal pain and a history of BRBPR. Since the time of her admission she has experienced recurrent GI bleeding. Ultimately she was taken for an EGD and capsule  endoscopy. Additionally she has been confirmed to have osteomyelitis of the right ankle s/p right BKA 10/23. Dx also with acute encephalopathy of unclear origin, MRI negative; BSE completed on 10/27 with regular/thin liquids recommended.  ST f/u for dysphagia tx/diet check d/t limited intake for swallow evaluation.      SLP Plan  All goals met      Recommendations for follow up therapy are one component of a multi-disciplinary discharge planning process, led by the attending physician.  Recommendations may be updated based on patient status, additional functional criteria and insurance authorization.    Recommendations  Diet recommendations: Regular;Thin liquid Liquids provided via: Cup Medication Administration: Other (Comment) (whole meds with liquids one at a time) Supervision: Patient able to self feed;Staff to assist with self feeding;Intermittent supervision to cue for compensatory strategies Compensations: Slow rate;Small sips/bites;Minimize environmental distractions                  Oral care BID   Other (comment) (TBD) Dysphagia, unspecified (R13.10)     All goals met     Pat Kiyoshi Schaab,M.S.,CCC-SLP  01/03/2023, 12:46 PM

## 2023-01-03 NOTE — TOC Progression Note (Signed)
Transition of Care [facility]) - Progression Note    Patient Details  Name: INFANT COTLER MRN: [id number] Date of Birth: 1965-08-22  Transition of Care [facility]) CM/SW Contact  Kimberlye Dilger A [place], [place] Phone Number: 01/03/2023, 2:52 PM  Clinical Narrative:     CSW met with pt at bedside and provided be offers. CSW provided contact information for Olegario Messier at [facility], pt's preference to get more information on facility, etc. Transportation to facility to be determined by pt's spouse and/or [facility] [place].  TOC will continue to follow.        Expected Discharge Plan and Services                                               Social Determinants of Health (SDOH) Interventions SDOH Screenings   Food Insecurity: No Food Insecurity (12/15/2022)  Housing: Patient Declined (12/15/2022)  Transportation Needs: No Transportation Needs (12/15/2022)  Utilities: Not At Risk (12/15/2022)  Depression (PHQ2-9): High Risk (11/18/2022)  Financial Resource Strain: Low Risk (03/23/2021)   [facility] from [facility])  Physical Activity: Not on File (10/31/2017)   Received from [place], [place]  Social Connections: Unknown (07/19/2021)   Received from [facility], [facility]  Stress: Low Risk (03/23/2021)   Received from [facility])  Tobacco Use: Low Risk  (12/31/2022)    Readmission Risk Interventions    11/25/2022    5:16 PM 07/05/2022    1:20 PM  Readmission Risk Prevention Plan  Transportation Screening Complete Complete  Medication Review (RN Care Manager) Complete Referral to Pharmacy  PCP or Specialist appointment within 3-5 days of discharge Complete Complete  HRI or Home Care Consult Complete Complete  SW Recovery Care/Counseling Consult Complete Complete  Palliative Care Screening Not Applicable Not Applicable  Skilled Nursing Facility Not Applicable Not Applicable

## 2023-01-03 NOTE — Plan of Care (Signed)
  Problem: Education: Goal: Understanding of CV disease, CV risk reduction, and recovery process will improve Outcome: Progressing Goal: Individualized Educational Video(s) Outcome: Progressing   Problem: Activity: Goal: Ability to return to baseline activity level will improve Outcome: Progressing   Problem: Cardiovascular: Goal: Ability to achieve and maintain adequate cardiovascular perfusion will improve Outcome: Progressing Goal: Vascular access site(s) Level 0-1 will be maintained Outcome: Progressing   Problem: Health Behavior/Discharge Planning: Goal: Ability to safely manage health-related needs after discharge will improve Outcome: Progressing   Problem: Education: Goal: Ability to describe self-care measures that may prevent or decrease complications (Diabetes Survival Skills Education) will improve Outcome: Progressing Goal: Individualized Educational Video(s) Outcome: Progressing   Problem: Coping: Goal: Ability to adjust to condition or change in health will improve Outcome: Progressing   Problem: Fluid Volume: Goal: Ability to maintain a balanced intake and output will improve Outcome: Progressing   Problem: Health Behavior/Discharge Planning: Goal: Ability to identify and utilize available resources and services will improve Outcome: Progressing Goal: Ability to manage health-related needs will improve Outcome: Progressing   Problem: Metabolic: Goal: Ability to maintain appropriate glucose levels will improve Outcome: Progressing   Problem: Nutritional: Goal: Progress toward achieving an optimal weight will improve Outcome: Progressing   Problem: Skin Integrity: Goal: Risk for impaired skin integrity will decrease Outcome: Progressing   Problem: Tissue Perfusion: Goal: Adequacy of tissue perfusion will improve Outcome: Progressing   Problem: Education: Goal: Knowledge of General Education information will improve Description: Including pain  rating scale, medication(s)/side effects and non-pharmacologic comfort measures Outcome: Progressing   Problem: Health Behavior/Discharge Planning: Goal: Ability to manage health-related needs will improve Outcome: Progressing   Problem: Clinical Measurements: Goal: Ability to maintain clinical measurements within normal limits will improve Outcome: Progressing Goal: Will remain free from infection Outcome: Progressing Goal: Diagnostic test results will improve Outcome: Progressing Goal: Respiratory complications will improve Outcome: Progressing Goal: Cardiovascular complication will be avoided Outcome: Progressing   Problem: Activity: Goal: Risk for activity intolerance will decrease Outcome: Progressing   Problem: Nutrition: Goal: Adequate nutrition will be maintained Outcome: Progressing   Problem: Coping: Goal: Level of anxiety will decrease Outcome: Progressing   Problem: Elimination: Goal: Will not experience complications related to bowel motility Outcome: Progressing   Problem: Pain Managment: Goal: General experience of comfort will improve Outcome: Progressing   Problem: Safety: Goal: Ability to remain free from injury will improve Outcome: Progressing   Problem: Skin Integrity: Goal: Risk for impaired skin integrity will decrease Outcome: Progressing   Problem: Education: Goal: Knowledge of the prescribed therapeutic regimen will improve Outcome: Progressing Goal: Ability to verbalize activity precautions or restrictions will improve Outcome: Progressing Goal: Understanding of discharge needs will improve Outcome: Progressing   Problem: Activity: Goal: Ability to perform//tolerate increased activity and mobilize with assistive devices will improve Outcome: Progressing   Problem: Clinical Measurements: Goal: Postoperative complications will be avoided or minimized Outcome: Progressing   Problem: Self-Care: Goal: Ability to meet self-care  needs will improve Outcome: Progressing   Problem: Self-Concept: Goal: Ability to maintain and perform role responsibilities to the fullest extent possible will improve Outcome: Progressing   Problem: Pain Management: Goal: Pain level will decrease with appropriate interventions Outcome: Progressing   Problem: Skin Integrity: Goal: Demonstration of wound healing without infection will improve Outcome: Progressing

## 2023-01-03 NOTE — Progress Notes (Signed)
PT Cancellation Note  Patient Details Name: Deborah [name] MRN: [id number] DOB: 06-29-1965   Cancelled Treatment:    Reason Eval/Treat Not Completed: (P) Patient at procedure or test/unavailable (pt being taken to HD dept when PTA attempted ~1437.) Will continue efforts per PT plan of care as schedule permits.   Dorathy Kinsman Chee Dimon 01/03/2023, 3:59 PM

## 2023-01-03 NOTE — Progress Notes (Signed)
Left heel cleansed with Vashe, applied xeroform gauze then covered with foam dressing. Patient tolerated it well.

## 2023-01-03 NOTE — Progress Notes (Signed)
[facility] Progress Note   Subjective:    Seen and examined patient at bedside. Appears more awake and alert and in better spirits. Denies SOB, CP, and N/V. Plan for HD today.  Objective Vitals:   01/02/23 1621 01/02/23 2020 01/03/23 0431 01/03/23 0914  BP: (!) 148/82 128/73 137/79 (!) 135/93  Pulse: 98 98 98 (!) 101  Resp: 18 18 17 19   Temp: 98 F (36.7 C) 97.6 F (36.4 C) 98.1 F (36.7 C) (!) 97.4 F (36.3 C)  TempSrc: Oral Oral  Oral  SpO2: 100% 98% 99% 99%  Weight:      Height:       Physical Exam General: Chronically ill appearing woman, NAD. Room air. Heart: RRR; no murmur Lungs: CTA anteriorly, no wheezing Abdomen: soft Extremities: R BKA in hard brace, trace LLE edema with heel bandaged Dialysis Access: TDC in R chest, L AVF + bruit, two glued incisions  Filed Weights   12/31/22 1454 12/31/22 1800 01/01/23 0500  Weight: 121.9 kg 120.3 kg 121 kg    Intake/Output Summary (Last 24 hours) at 01/03/2023 1049 Last data filed at 01/03/2023 0423 Gross per 24 hour  Intake 180 ml  Output 0 ml  Net 180 ml    Additional Objective Labs: Basic Metabolic Panel: Recent Labs  Lab 12/31/22 0425 12/31/22 1505 01/02/23 0443  NA 136 133* 136  K 3.1* 3.5 3.2*  CL 100 99 100  CO2 25 25 26   GLUCOSE 140* 157* 129*  BUN 24* 26* 30*  CREATININE 4.20* 4.19* 4.49*  CALCIUM 7.9* 7.7* 8.3*  PHOS 4.1 4.1 3.7   Liver Function Tests: Recent Labs  Lab 12/31/22 0425 12/31/22 1505 01/02/23 0443  ALBUMIN 1.6* 1.7* 1.9*   No results for input(s): "LIPASE", "AMYLASE" in the last 168 hours. CBC: Recent Labs  Lab 12/31/22 0425 12/31/22 1505 01/01/23 1736 01/02/23 0443 01/03/23 0425  WBC 16.4* 18.2* 18.9* 18.8* 14.9*  HGB 7.3* 7.6* 7.7* 7.8* 8.3*  HCT 24.4* 25.9* 25.1* 25.8* 27.3*  MCV 96.8 95.9 93.0 95.9 96.5  PLT 328 362 387 413* 407*   Blood Culture    Component Value Date/Time   SDES BLOOD SITE NOT SPECIFIED 11/23/2022 0023   SPECREQUEST   11/23/2022 0023    BOTTLES DRAWN AEROBIC AND ANAEROBIC Blood Culture adequate volume   CULT  11/23/2022 0023    NO GROWTH 5 DAYS Performed at [facility], [address], [place], [place] [address]    REPTSTATUS 11/28/2022 FINAL 11/23/2022 0023    Cardiac Enzymes: No results for input(s): "CKTOTAL", "CKMB", "CKMBINDEX", "TROPONINI" in the last 168 hours. CBG: Recent Labs  Lab 01/02/23 0710 01/02/23 1121 01/02/23 1616 01/02/23 2023 01/03/23 0740  GLUCAP 162* 71 109* 90 83   Iron Studies: No results for input(s): "IRON", "TIBC", "TRANSFERRIN", "FERRITIN" in the last 72 hours. Lab Results  Component Value Date   INR 1.9 (H) 12/15/2022   INR 1.8 (H) 11/23/2022   INR 1.1 10/08/2020   Studies/Results: CT HEAD WO CONTRAST ( )  Result Date: 01/01/2023 CLINICAL DATA:  Acute neurologic deficit EXAM: CT HEAD WITHOUT CONTRAST TECHNIQUE: Contiguous axial images were obtained from the base of the skull through the vertex without intravenous contrast. RADIATION DOSE REDUCTION: This exam was performed according to the departmental dose-optimization program which includes automated exposure control, adjustment of the mA and/or kV according to patient size and/or use of iterative reconstruction technique. COMPARISON:  05/29/2022 FINDINGS: Brain: No mass,hemorrhage or extra-axial collection. Normal appearance of the  parenchyma and CSF spaces. Vascular: Atherosclerotic calcification of the internal carotid arteries at the skull base. No abnormal hyperdensity of the major intracranial arteries or dural venous sinuses. Skull: The visualized skull base, calvarium and extracranial soft tissues are normal. Sinuses/Orbits: No fluid levels or advanced mucosal thickening of the visualized paranasal sinuses. No mastoid or middle ear effusion. Normal orbits. IMPRESSION: No acute intracranial abnormality. Electronically Signed   By: Deatra Robinson M.D.   On: 01/01/2023 22:54    Medications:   anticoagulant sodium citrate      (feeding supplement) PROSource Plus  30 mL Oral BID BM   acetaminophen  650 mg Oral Q6H   acidophilus  2 capsule Oral Daily   allopurinol  100 mg Oral BID   vitamin C  1,000 mg Oral Daily   atorvastatin  10 mg Oral QHS   darbepoetin (ARANESP) injection - DIALYSIS  200 mcg Subcutaneous Q Fri-1800   docusate sodium  100 mg Oral Daily   doxercalciferol  7 mcg Intravenous Q M,W,F-HD   feeding supplement (NEPRO CARB STEADY)  237 mL Oral BID BM   ferric citrate  420 mg Oral TID WC   folic acid  1 mg Oral Daily   Gerhardt's butt cream   Topical BID   insulin aspart  0-6 Units Subcutaneous TID WC   insulin aspart  2 Units Subcutaneous TID WC   insulin glargine-yfgn  8 Units Subcutaneous QHS   nutrition supplement (JUVEN)  1 packet Oral BID BM   pantoprazole  40 mg Oral Daily   sucralfate  1 g Oral TID WC & HS   valbenazine  40 mg Oral Daily   zinc sulfate  220 mg Oral Daily    Dialysis Orders: MWF [facility]  4hr, 180dialyzer, 400/800, EDW 126.9kg, 3K/2.5Ca bath, TDC in R chest, 1st stage LUE BVT 06/2022 - mircera 225 mcg last 10/5 - hectorol 7 mcg  Assessment/Plan: ESRD: Continue HD on MWF schedule - next 10/28. Hypokalemia: Using 4K bath with HD. Right foot osteomyelitis/abscess s/p R BKA on 12/29/22: Per ortho.  BP/volume: BP stable, edema improving. Will have lower EDW on d/c s/p amputation. Anemia (ABLA/GIB + ESRD): S/p EGD Multiple gastric AVMs, treated with APC and capsule endo 10/17 showing polyps only. Colonoscopy recommended. Getting Aranesp q Friday here. Hgb 7.8 - stable. Secondary Hyperparathyroidism/Hyperphosphatemia: CorrCa/Phos good, continue meds. Vascular access: TDC in place, s/p AVF revision/transposition 10/25. AMS/acute toxic encephalopathy (resolved): Med induced, likely. MRI without acute findings.  T2DM: Insulin per primary. Nutrition: Alb very low, continue supplements.  Deborah Holmes, NP [facility] 01/03/2023,10:49 AM  LOS: 20 days

## 2023-01-03 NOTE — Progress Notes (Addendum)
   01/03/23 2029  Vitals  Temp (!) 97.2 F (36.2 C)  Temp Source Axillary  BP 110/62  MAP (mmHg) 76  BP Location Right Arm  BP Method Automatic  Patient Position (if appropriate) Lying  Pulse Rate (!) 111  Pulse Rate Source Monitor  ECG Heart Rate 84  Resp 18  Oxygen Therapy  SpO2 98 %  O2 Device Room Air  During Treatment Monitoring  Blood Flow Rate (mL/min) 249 mL/min  Arterial Pressure (mmHg) -107.87 mmHg  Venous Pressure (mmHg) 117.16 mmHg  TMP (mmHg) 33.73 mmHg  Ultrafiltration Rate (mL/min) 1617 mL/min  Dialysate Flow Rate (mL/min) 299 ml/min  Dialysate Potassium Concentration 4  Dialysate Calcium Concentration 2.25  Duration of HD Treatment -hour(s) 3.49 hour(s)  Cumulative Fluid Removed (mL) per Treatment  1986.41  HD Safety Checks Performed Yes  Intra-Hemodialysis Comments Tx completed  Post Treatment  Dialyzer Clearance Lightly streaked  Liters Processed 76.3  Fluid Removed (mL) 2000 mL  Tolerated HD Treatment Yes  Hemodialysis Catheter Right Subclavian Double lumen Permanent (Tunneled)  Placement Date/Time: 06/24/22 1644   Serial / Lot #: [id number]  Expiration Date: 09/08/26  Time Out: Correct patient;Correct site;Correct procedure  Maximum sterile barrier precautions: Hand hygiene;Mask;Cap;Sterile gloves;Sterile gown;Large sterile ...  Site Condition No complications  Blue Lumen Status Heparin locked  Red Lumen Status Heparin locked  Catheter fill solution Heparin 1000 units/ml  Catheter fill volume (Arterial) 1.6 cc  Catheter fill volume (Venous) 1.6  Dressing Type Transparent  Dressing Status Clean, Dry, Intact  Drainage Description None  Dressing Change Due 01/06/23  Post treatment catheter status Capped and Clamped   Pt time extended due to cartridge and filter change at 1930. Treatment resume at 1945.Hand off to primary RN Artemio Aly.

## 2023-01-03 NOTE — Progress Notes (Signed)
IV team consulted for PIV access. Pt leaving for HD. RN to replace consult when patient returns.

## 2023-01-04 ENCOUNTER — Other Ambulatory Visit (HOSPITAL_COMMUNITY): Payer: Self-pay

## 2023-01-04 ENCOUNTER — Ambulatory Visit: Payer: 59 | Admitting: Orthopedic Surgery

## 2023-01-04 DIAGNOSIS — D649 Anemia, unspecified: Secondary | ICD-10-CM | POA: Diagnosis not present

## 2023-01-04 LAB — GLUCOSE, CAPILLARY
Glucose-Capillary: 106 mg/dL — ABNORMAL HIGH (ref 70–99)
Glucose-Capillary: 113 mg/dL — ABNORMAL HIGH (ref 70–99)
Glucose-Capillary: 113 mg/dL — ABNORMAL HIGH (ref 70–99)
Glucose-Capillary: 130 mg/dL — ABNORMAL HIGH (ref 70–99)
Glucose-Capillary: 131 mg/dL — ABNORMAL HIGH (ref 70–99)
Glucose-Capillary: 133 mg/dL — ABNORMAL HIGH (ref 70–99)

## 2023-01-04 MED ORDER — ACETAMINOPHEN 325 MG PO TABS
650.0000 mg | ORAL_TABLET | Freq: Four times a day (QID) | ORAL | Status: DC | PRN
  Administered 2023-01-09: 650 mg via ORAL
  Filled 2023-01-04 (×2): qty 2

## 2023-01-04 MED ORDER — HYDROMORPHONE HCL 1 MG/ML IJ SOLN
1.0000 mg | INTRAMUSCULAR | Status: DC | PRN
  Administered 2023-01-04: 2 mg via INTRAVENOUS
  Administered 2023-01-05: 1 mg via INTRAVENOUS
  Administered 2023-01-05 – 2023-01-06 (×8): 2 mg via INTRAVENOUS
  Administered 2023-01-06: 1 mg via INTRAVENOUS
  Administered 2023-01-06: 2 mg via INTRAVENOUS
  Administered 2023-01-07 – 2023-01-08 (×7): 1 mg via INTRAVENOUS
  Filled 2023-01-04 (×3): qty 2
  Filled 2023-01-04: qty 1
  Filled 2023-01-04 (×3): qty 2
  Filled 2023-01-04: qty 1
  Filled 2023-01-04: qty 2
  Filled 2023-01-04 (×2): qty 1
  Filled 2023-01-04: qty 2
  Filled 2023-01-04 (×2): qty 1
  Filled 2023-01-04: qty 2
  Filled 2023-01-04: qty 1
  Filled 2023-01-04: qty 2
  Filled 2023-01-04: qty 1
  Filled 2023-01-04 (×3): qty 2

## 2023-01-04 MED ORDER — HYDROMORPHONE HCL 2 MG PO TABS
2.0000 mg | ORAL_TABLET | ORAL | Status: DC | PRN
  Administered 2023-01-07: 2 mg via ORAL
  Administered 2023-01-09 – 2023-01-10 (×6): 4 mg via ORAL
  Filled 2023-01-04 (×8): qty 2
  Filled 2023-01-04: qty 1

## 2023-01-04 MED ORDER — HYDROMORPHONE HCL 1 MG/ML IJ SOLN: 1.0000 mg | INTRAMUSCULAR | Status: DC | PRN

## 2023-01-04 NOTE — Progress Notes (Addendum)
[facility] Progress Note   Subjective:    Seen and examined patient at bedside. She looks better. Tolerated HD overnight with net UF 2L. She's talking on the phone and denies any acute issues. She is deciding on which SNF is best for her.  Objective Vitals:   01/04/23 0500 01/04/23 0519 01/04/23 0525 01/04/23 0937  BP:  125/81  (!) 112/59  Pulse:  (!) 102  (!) 101  Resp:  18  18  Temp:  98.6 F (37 C)  98.2 F (36.8 C)  TempSrc:    Oral  SpO2:  (!) 89% 91% 93%  Weight: 121 kg     Height:       Physical Exam General: Chronically ill appearing woman, NAD. Room air. Heart: RRR; no murmur Lungs: CTA anteriorly, no wheezing Abdomen: soft Extremities: R BKA in hard brace, trace LLE edema with heel bandaged Dialysis Access: TDC in R chest, L AVF + bruit, two glued incisions  Filed Weights   12/31/22 1800 01/01/23 0500 01/04/23 0500  Weight: 120.3 kg 121 kg 121 kg    Intake/Output Summary (Last 24 hours) at 01/04/2023 1419 Last data filed at 01/04/2023 1200 Gross per 24 hour  Intake 240 ml  Output 2000 ml  Net -1760 ml    Additional Objective Labs: Basic Metabolic Panel: Recent Labs  Lab 12/31/22 1505 01/02/23 0443 01/03/23 1521  NA 133* 136 134*  K 3.5 3.2* 3.2*  CL 99 100 100  CO2 25 26 24   GLUCOSE 157* 129* 116*  BUN 26* 30* 36*  CREATININE 4.19* 4.49* 4.72*  CALCIUM 7.7* 8.3* 7.8*  PHOS 4.1 3.7 3.9   Liver Function Tests: Recent Labs  Lab 12/31/22 1505 01/02/23 0443 01/03/23 1521  ALBUMIN 1.7* 1.9* 1.8*   No results for input(s): "LIPASE", "AMYLASE" in the last 168 hours. CBC: Recent Labs  Lab 12/31/22 0425 12/31/22 1505 01/01/23 1736 01/02/23 0443 01/03/23 0425  WBC 16.4* 18.2* 18.9* 18.8* 14.9*  HGB 7.3* 7.6* 7.7* 7.8* 8.3*  HCT 24.4* 25.9* 25.1* 25.8* 27.3*  MCV 96.8 95.9 93.0 95.9 96.5  PLT 328 362 387 413* 407*   Blood Culture    Component Value Date/Time   SDES BLOOD SITE NOT SPECIFIED 11/23/2022 0023    SPECREQUEST  11/23/2022 0023    BOTTLES DRAWN AEROBIC AND ANAEROBIC Blood Culture adequate volume   CULT  11/23/2022 0023    NO GROWTH 5 DAYS Performed at [facility], [address] [place], [place] [address]    REPTSTATUS 11/28/2022 FINAL 11/23/2022 0023    Cardiac Enzymes: No results for input(s): "CKTOTAL", "CKMB", "CKMBINDEX", "TROPONINI" in the last 168 hours. CBG: Recent Labs  Lab 01/03/23 1135 01/04/23 0008 01/04/23 0522 01/04/23 0719 01/04/23 1118  GLUCAP 73 130* 131* 113* 113*   Iron Studies: No results for input(s): "IRON", "TIBC", "TRANSFERRIN", "FERRITIN" in the last 72 hours. Lab Results  Component Value Date   INR 1.9 (H) 12/15/2022   INR 1.8 (H) 11/23/2022   INR 1.1 10/08/2020   Studies/Results: No results found.  Medications:  anticoagulant sodium citrate      (feeding supplement) PROSource Plus  30 mL Oral BID BM   allopurinol  100 mg Oral BID   vitamin C  1,000 mg Oral Daily   atorvastatin  10 mg Oral QHS   darbepoetin (ARANESP) injection - DIALYSIS  200 mcg Subcutaneous Q Fri-1800   docusate sodium  100 mg Oral Daily   doxercalciferol  7 mcg Intravenous Q  M,W,F-HD   feeding supplement (NEPRO CARB STEADY)  237 mL Oral BID BM   ferric citrate  420 mg Oral TID WC   folic acid  1 mg Oral Daily   Gerhardt's butt cream   Topical BID   insulin aspart  0-6 Units Subcutaneous TID WC   insulin aspart  2 Units Subcutaneous TID WC   insulin glargine-yfgn  8 Units Subcutaneous QHS   nutrition supplement (JUVEN)  1 packet Oral BID BM   pantoprazole  40 mg Oral Daily   sucralfate  1 g Oral TID WC & HS   valbenazine  40 mg Oral Daily   zinc sulfate  220 mg Oral Daily    Dialysis Orders: MWF [facility]  4hr, 180dialyzer, 400/800, EDW 126.9kg, 3K/2.5Ca bath, TDC in R chest, 1st stage LUE BVT 06/2022 - mircera 225 mcg last 10/5 - hectorol 7 mcg  Assessment/Plan: ESRD: Continue HD on MWF schedule - next 10/30. Hypokalemia: Using 4K bath with HD. Right  foot osteomyelitis/abscess s/p R BKA on 12/29/22: Per ortho.  BP/volume: BP stable, edema improving. Will have lower EDW on d/c s/p amputation. Anemia (ABLA/GIB + ESRD): S/p EGD Multiple gastric AVMs, treated with APC and capsule endo 10/17 showing polyps only. Colonoscopy recommended. Getting Aranesp q Friday here. Hgb 8.3. Secondary Hyperparathyroidism/Hyperphosphatemia: CorrCa/Phos good, continue meds. Vascular access: TDC in place, s/p AVF revision/transposition 10/25. AMS/acute toxic encephalopathy (resolved): Med induced, likely. MRI without acute findings.  T2DM: Insulin per primary. Nutrition: Alb very low, continue supplements. Dispo: Awaiting SNF placement  Salome Holmes, NP [facility] 01/04/2023,2:19 PM  LOS: 21 days

## 2023-01-04 NOTE — TOC Benefit Eligibility Note (Signed)
Patient Product/process development scientist completed.    The patient is insured through [facility]. Patient has Medicare and is not eligible for a copay card, but may be able to apply for patient assistance, if available.    Ran test claim for Ingrezza 40 mg and Requires Prior Authorization   This test claim was processed through [employer]- copay amounts may vary at other pharmacies due to [employer], or as the patient moves through the different stages of their insurance plan.     Roland Earl, CPHT Pharmacy Technician III Certified Patient Advocate [facility] Patient Advocate Team Direct Number: [phone]  Fax: [phone]

## 2023-01-04 NOTE — Progress Notes (Signed)
0 mL in wound vac.  Deborah [name]

## 2023-01-04 NOTE — Progress Notes (Signed)
The [address]  Deborah [name]  [id number]:[id number] DOB: 09-06-1965 DOA: 12/14/2022 PCP: Pcp, No    Brief Narrative:  57 year old with a history of ESRD on HD MWF, PAF on Eliquis, diastolic CHF, HTN, DM2, malignant duodenal carcinoid, NAFLD, morbid obesity, and a chronic right ankle ulcer who had recently been admitted 9/16-9/24/2024 at which time she underwent an angiogram noting adequate perfusion to her right foot with ultimate disposition being treatment for gout of the right ankle.  She returned to the ER 10/8 and was found to have a hemoglobin of 5.8 with crampy abdominal pain and a history of BRBPR.  Since the time of her admission she has experienced recurrent GI bleeding.  Ultimately she was taken for an EGD and capsule endoscopy.  Additionally she has been confirmed to have osteomyelitis of the right ankle with plans for right BKA 10/23.  Goals of Care:   Code Status: Full Code   DVT prophylaxis: SCD's Start: 12/29/22 2334   Interim Hx: No acute events recorded since the time of my last visit.  The patient is afebrile with stable vital signs.  CBG controlled.  Alert and conversant today.  Denies any new complaints.  Reports improving appetite.  No significant recurrence of tongue rolling behavior appreciable at time of exam today.  Assessment & Plan:  Acute encephalopathy of unclear etiology -previously due to opiate medication during this admission MRI brain unrevealing 10/9 - perhaps narcotics were to blame - CT head 10/26 with no acute findings - minimized meds that could lead to dystonic reaction given tongue rolling behavior - appears to have improved/stabilized as of 10/28  Possible tardive dyskinesia  Continuous rolling of tongue noted for ~48hrs - trial of ingrezza started 10/27 - antipsychotics/antidepressants have been stopped for now -continue Ingrezza for now but could consider discontinuing at time of discharge and monitoring   Acute anemia due to GI bleeding/blood loss on  chronic anemia of ESRD Has required 4 units PRBC total during this hospital stay - EGD noted minimal reflux esophagitis with normal appearing duodenum and 3 small AVMs with 1 having an adherent clot - some degree of rebleeding expected if she is to resume anticoagulation - not a candidate for long-term Carafate due to ESRD (will need to stop after 2 weeks max - Nov 1) - outpatient colonoscopy is recommended - hemoglobin improving/stable at this time  Recent Labs  Lab 12/31/22 0425 12/31/22 1505 01/01/23 1736 01/02/23 0443 01/03/23 0425  HGB 7.3* 7.6* 7.7* 7.8* 8.3*     ESRD on HD MWF Ongoing care per Nephrology team - was taken to the OR 10/25 for superficialization of her L brachiocephalic AV fistula   Chronic paroxysmal atrial fibrillation previously on DOAC Eliquis stopped and do not plan to resume given ongoing GI bleeding and known AVMs - rate well-controlled presently  Chronic right heel wound with acutely diagnosed osteomyelitis No evidence of osteomyelitis on MRI Sept 2024 -repeat MRI 10/14 consistent with early osteomyelitis - Dr. Lajoyce Corners performed BKA 10/23 - postop care per Orthopedic Surgery  DM2 CBG within goal at present  Duodenal carcinoid Status post resection  Chronic diastolic CHF Volume management per HD - appears euvolemic presently  Chronic back pain -lumbar spinal stenosis with neurogenic claudication Chronically on Dilaudid at home - reportedly wheelchair-bound for several months - MRI lumbar spine earlier this year noted lumbar spondylosis worsening since 2019 - evaluated by Neurosurgery March 2024 and not felt to be appropriate for surgical approach with suggestion to continue physical therapy -  will need SNF at time of discharge -has displayed some narcotic seeking behavior during this admission therefore care with narcotic pain medications is suggested  Morbid obesity with NAFLD - Body mass index is 43.06 kg/m.  Mood disorder Appears stable at  present  Gout Continue usual allopurinol  Obesity - Body mass index is 43.06 kg/m.    Family Communication: No family present at time of exam  Disposition: Medically ready for discharge to SNF when bed available   Objective: Blood pressure 125/81, pulse (!) 102, temperature 98.6 F (37 C), resp. rate 18, height 5\' 6"  (1.676 m), weight 121 kg, last menstrual period 10/10/2012, SpO2 91%.  Intake/Output Summary (Last 24 hours) at 01/04/2023 0846 Last data filed at 01/04/2023 0640 Gross per 24 hour  Intake 0 ml  Output 2000 ml  Net -2000 ml   Filed Weights   12/31/22 1800 01/01/23 0500 01/04/23 0500  Weight: 120.3 kg 121 kg 121 kg    Examination: General: No acute respiratory distress  Lungs: Clear to auscultation bilaterally -no wheezing or focal crackles Cardiovascular: RRR without murmur Abdomen: Nontender, nondistended, soft, bowel sounds positive, no rebound, no ascites, no appreciable mass -obese Extremities: No significant edema of the extremities  CBC: Recent Labs  Lab 01/01/23 1736 01/02/23 0443 01/03/23 0425  WBC 18.9* 18.8* 14.9*  HGB 7.7* 7.8* 8.3*  HCT 25.1* 25.8* 27.3*  MCV 93.0 95.9 96.5  PLT 387 413* 407*   Basic Metabolic Panel: Recent Labs  Lab 12/31/22 1505 01/02/23 0443 01/03/23 1521  NA 133* 136 134*  K 3.5 3.2* 3.2*  CL 99 100 100  CO2 25 26 24   GLUCOSE 157* 129* 116*  BUN 26* 30* 36*  CREATININE 4.19* 4.49* 4.72*  CALCIUM 7.7* 8.3* 7.8*  PHOS 4.1 3.7 3.9   GFR: Estimated Creatinine Clearance: 17.2 mL/min (A) (by C-G formula based on SCr of 4.72 mg/dL (H)).   Scheduled Meds:  (feeding supplement) PROSource Plus  30 mL Oral BID BM   acetaminophen  650 mg Oral Q6H   acidophilus  2 capsule Oral Daily   allopurinol  100 mg Oral BID   vitamin C  1,000 mg Oral Daily   atorvastatin  10 mg Oral QHS   darbepoetin (ARANESP) injection - DIALYSIS  200 mcg Subcutaneous Q Fri-1800   docusate sodium  100 mg Oral Daily   doxercalciferol   7 mcg Intravenous Q M,W,F-HD   feeding supplement (NEPRO CARB STEADY)  237 mL Oral BID BM   ferric citrate  420 mg Oral TID WC   folic acid  1 mg Oral Daily   Gerhardt's butt cream   Topical BID   insulin aspart  0-6 Units Subcutaneous TID WC   insulin aspart  2 Units Subcutaneous TID WC   insulin glargine-yfgn  8 Units Subcutaneous QHS   nutrition supplement (JUVEN)  1 packet Oral BID BM   pantoprazole  40 mg Oral Daily   sucralfate  1 g Oral TID WC & HS   valbenazine  40 mg Oral Daily   zinc sulfate  220 mg Oral Daily      LOS: 21 days   Lonia Blood, MD [facility] Office  [phone] Pager - Text Page per Loretha Stapler  If 7PM-7AM, please contact night-coverage per Amion 01/04/2023, 8:46 AM

## 2023-01-04 NOTE — Plan of Care (Signed)

## 2023-01-04 NOTE — Consult Note (Signed)
WOC Nurse Consult Note: familiar to WOC team from previous admissions; formerly charted as having unstageable PI to R ischium, full thickness L ischium  Reason for Consult: upper thigh wounds  Wound type: Stage 3 Pressure Injury R ischium; Stage 2 L ischium, Stage 2 Coccyx and B buttocks  Pressure Injury POA: Yes Measurement: 1.  R ischium 6 cm x 2 cm x 0.2 cm 100% red moist after cleaning  2.  L ischium 4 cm x 2 cm x 0.1 cm 100% red moist; Coccyx 3 cm x 4 cm x 0.1 cm 100% pink moist; bilateral buttocks approximately 3 cm x 2 cm x 0.1 cm each 100% red moist  Wound bed: as above  Drainage (amount, consistency, odor) minimal tan B ischium  Periwound: intact; using Gerhardt's to buttocks  Dressing procedure/placement/frequency: Clean B ischium, coccyx and buttocks wounds with Vashe wound cleanser [place] [phone]), cut a small piece of silver hydrofiber (Aquacel [place] [id number]) to fit B ischial and coccyx wound beds daily and cover with silicone foam.  Buttocks wounds require no treatment other than Gerhardt's and silicone foam.    POC discussed with patient and bedside nurse. Patient is in agreement.   WOC team will not follow. Re-consult if further needs arise.  Thank you,    Priscella Mann MSN, RN-BC, [employer] [phone]

## 2023-01-04 NOTE — TOC Progression Note (Addendum)
Transition of Care [facility]) - Progression Note    Patient Details  Name: Deborah [name] MRN: [id number] Date of Birth: 1965-03-22  Transition of Care [facility]) CM/SW Contact  Ferdie Bakken A [place], [place] Phone Number: 01/04/2023, 12:04 PM  Clinical Narrative:     Update 10/29 1652  CSW met with pt at bedside, he stated that she still needs one more day to make decision. She has been informed that [facility] provides transportation to [facility] and may need to switch based on choosing that place.   CSW met with pt at bedside. She said that she wants to speak with her mother first and decide and will let CSW know where she chose. CSW updated renal navigator regarding possible acceptance to [facility] and needing possible OPHD facility change.   TOC will continue to follow.        Expected Discharge Plan and Services                                               Social Determinants of Health (SDOH) Interventions SDOH Screenings   Food Insecurity: No Food Insecurity (12/15/2022)  Housing: Patient Declined (12/15/2022)  Transportation Needs: No Transportation Needs (12/15/2022)  Utilities: Not At Risk (12/15/2022)  Depression (PHQ2-9): High Risk (11/18/2022)  Financial Resource Strain: Low Risk (03/23/2021)   Received from [facility])  Physical Activity: Not on File (10/31/2017)   Received from [place], [place]  Social Connections: Unknown (07/19/2021)   Received from [facility], [facility]  Stress: Low Risk (03/23/2021)   Received from [facility] ([facility]), [facility])  Tobacco Use: Low Risk  (12/31/2022)    Readmission Risk Interventions    11/25/2022    5:16 PM 07/05/2022    1:20 PM  Readmission Risk Prevention Plan  Transportation Screening Complete Complete  Medication Review (RN Care Manager) Complete Referral to Pharmacy  PCP or Specialist appointment within 3-5 days of discharge Complete Complete  HRI or Home Care  Consult Complete Complete  SW Recovery Care/Counseling Consult Complete Complete  Palliative Care Screening Not Applicable Not Applicable  Skilled Nursing Facility Not Applicable Not Applicable

## 2023-01-05 DIAGNOSIS — D649 Anemia, unspecified: Secondary | ICD-10-CM | POA: Diagnosis not present

## 2023-01-05 LAB — CBC WITH DIFFERENTIAL/PLATELET
Abs Immature Granulocytes: 0.19 K/uL — ABNORMAL HIGH (ref 0.00–0.07)
Basophils Absolute: 0.1 K/uL (ref 0.0–0.1)
Basophils Relative: 1 %
Eosinophils Absolute: 0.3 K/uL (ref 0.0–0.5)
Eosinophils Relative: 2 %
HCT: 23.1 % — ABNORMAL LOW (ref 36.0–46.0)
Hemoglobin: 7 g/dL — ABNORMAL LOW (ref 12.0–15.0)
Immature Granulocytes: 1 %
Lymphocytes Relative: 21 %
Lymphs Abs: 2.9 K/uL (ref 0.7–4.0)
MCH: 29.3 pg (ref 26.0–34.0)
MCHC: 30.3 g/dL (ref 30.0–36.0)
MCV: 96.7 fL (ref 80.0–100.0)
Monocytes Absolute: 0.8 K/uL (ref 0.1–1.0)
Monocytes Relative: 6 %
Neutro Abs: 9.2 K/uL — ABNORMAL HIGH (ref 1.7–7.7)
Neutrophils Relative %: 69 %
Platelets: 384 K/uL (ref 150–400)
RBC: 2.39 MIL/uL — ABNORMAL LOW (ref 3.87–5.11)
RDW: 15.9 % — ABNORMAL HIGH (ref 11.5–15.5)
WBC: 13.5 K/uL — ABNORMAL HIGH (ref 4.0–10.5)
nRBC: 0 % (ref 0.0–0.2)

## 2023-01-05 LAB — GLUCOSE, CAPILLARY
Glucose-Capillary: 130 mg/dL — ABNORMAL HIGH (ref 70–99)
Glucose-Capillary: 154 mg/dL — ABNORMAL HIGH (ref 70–99)
Glucose-Capillary: 176 mg/dL — ABNORMAL HIGH (ref 70–99)
Glucose-Capillary: 115 mg/dL — ABNORMAL HIGH (ref 70–99)

## 2023-01-05 LAB — RENAL FUNCTION PANEL
Albumin: 1.9 g/dL — ABNORMAL LOW (ref 3.5–5.0)
Anion gap: 8 (ref 5–15)
BUN: 20 mg/dL (ref 6–20)
CO2: 25 mmol/L (ref 22–32)
Calcium: 8.2 mg/dL — ABNORMAL LOW (ref 8.9–10.3)
Chloride: 100 mmol/L (ref 98–111)
Creatinine, Ser: 4.26 mg/dL — ABNORMAL HIGH (ref 0.44–1.00)
GFR, Estimated: 11 mL/min — ABNORMAL LOW
Glucose, Bld: 121 mg/dL — ABNORMAL HIGH (ref 70–99)
Phosphorus: 2.7 mg/dL (ref 2.5–4.6)
Potassium: 3.2 mmol/L — ABNORMAL LOW (ref 3.5–5.1)
Sodium: 133 mmol/L — ABNORMAL LOW (ref 135–145)

## 2023-01-05 LAB — PREPARE RBC (CROSSMATCH)

## 2023-01-05 MED ORDER — LIDOCAINE HCL (PF) 1 % IJ SOLN: 5.0000 mL | INTRAMUSCULAR | Status: DC | PRN

## 2023-01-05 MED ORDER — LORAZEPAM 1 MG PO TABS
1.0000 mg | ORAL_TABLET | Freq: Once | ORAL | Status: DC
  Filled 2023-01-05: qty 1

## 2023-01-05 MED ORDER — HYDROMORPHONE HCL 1 MG/ML IJ SOLN: 1.0000 mg | Freq: Once | INTRAMUSCULAR | Status: DC | PRN

## 2023-01-05 MED ORDER — HEPARIN SODIUM (PORCINE) 1000 UNIT/ML DIALYSIS
1000.0000 [IU] | INTRAMUSCULAR | Status: DC | PRN
  Filled 2023-01-05: qty 1

## 2023-01-05 MED ORDER — LIDOCAINE-PRILOCAINE 2.5-2.5 % EX CREA: 1.0000 | TOPICAL_CREAM | CUTANEOUS | Status: DC | PRN

## 2023-01-05 MED ORDER — PENTAFLUOROPROP-TETRAFLUOROETH EX AERO: 1.0000 | INHALATION_SPRAY | CUTANEOUS | Status: DC | PRN

## 2023-01-05 MED ORDER — SODIUM CHLORIDE 0.9% IV SOLUTION: Freq: Once | INTRAVENOUS | Status: DC

## 2023-01-05 MED ORDER — ALTEPLASE 2 MG IJ SOLR: 2.0000 mg | Freq: Once | INTRAMUSCULAR | Status: DC | PRN

## 2023-01-05 NOTE — Progress Notes (Signed)
Progress Note   Patient: Deborah [name] BJY:[id number] DOB: [birth year] DOA: 12/14/2022     22 DOS: the patient was seen and examined on 01/05/2023 9:30AM      Brief hospital course: 57 year old with a history of ESRD on HD MWF, PAF on Eliquis, diastolic CHF, HTN, DM2, malignant duodenal carcinoid, NAFLD, morbid obesity, and a chronic right ankle ulcer who had recently been admitted 9/16-9/24/2024 at which time she underwent an angiogram noting adequate perfusion to her right foot with ultimate disposition being treatment for gout of the right ankle.  She returned to the ER 10/8 and was found to have a hemoglobin of 5.8 with crampy abdominal pain and a history of BRBPR.   Since the time of her admission she has experienced recurrent GI bleeding.  Ultimately she was taken for an EGD and capsule endoscopy.  Additionally she has been confirmed to have osteomyelitis of the right ankle with plans for right BKA 10/23.     Assessment and Plan: Acute GI bleed Hemoglobin trending down - Transfusion per nephrology   Right heel wound Osteomyelitis S/p right BKA 10/23 -Continue Protonix - Continue Carafate  Type 2 diabetes Glucose reasonably controlled - Continue glargine - Continue sliding scale corrections - Continue Lipitor  Neuroendocrine carcinoma of the duodenum  End-stage renal disease -Continue Aranesp, Hectorol  Acute toxic encephalopathy due to opiates  Possible tardive dyskinesia -Continue valbenazine   Chronic paroxysmal atrial fibrillation Rate controlled -Hold anticoagulation  Chronic diastolic congestive heart failure Appears euvolemic - Fluid management per dialysis  Chronic back pain  Morbid obesity  Gout  -Continue allopurinol  Nonalcoholic steatohepatitis           Subjective: No melena overnight, no hematochezia, no fever, no confusion, no new nursing concerns     Physical Exam: BP 112/76   Pulse (!) 112   Temp 98 F (36.7 C)  (Oral)   Resp (!) 22   Ht 5\' 6"  (1.676 m)   Wt 121 kg   LMP 10/10/2012   SpO2 100%   BMI 43.06 kg/m   Obese adult female, lying in bed, interactive improvement RRR, no murmurs, no peripheral edema Respiratory rate normal, lungs clear without rales or wheezes Abdomen soft no tenderness palpation or guarding Attention normal, affect normal, judgment insight appear normal      Data Reviewed: Basic metabolic panel shows sodium 133, potassium 3.2 Creatinine 4.2 CBC shows white blood cell count 13, hemoglobin 7  Family Communication: None at the bedside    Disposition: Status is: Inpatient         Author: Alberteen Sam, MD 01/05/2023 6:49 PM  For on call review [email or web address].

## 2023-01-05 NOTE — Progress Notes (Signed)
[facility] Progress Note   Subjective:    Plan for HD tonight d/t high HD census. Noted Hgb is 7 so will order 1 unit PRBCs with HD tonight. Seen and examined patient at bedside. Patient's husband also at bedside. Tearful today d/t anxiety. Reports mild dizziness. Bps appear stable. Denies SOB, CP, and N/V.   Objective Vitals:   01/04/23 1640 01/04/23 1900 01/05/23 0500 01/05/23 0923  BP: (!) 115/48 121/60 132/73 (!) 115/58  Pulse: (!) 101 100 (!) 104 100  Resp: 18 19 18 18   Temp: 98 F (36.7 C) 97.6 F (36.4 C) 98.1 F (36.7 C) 98.3 F (36.8 C)  TempSrc:  Oral Oral Oral  SpO2: 100% 100% 100% 100%  Weight:      Height:       Physical Exam General: Chronically ill appearing woman, NAD. Room air. Heart: RRR; no murmur Lungs: CTA throughout, no wheezing Abdomen: soft Extremities: R BKA in hard brace, trace LLE edema with heel bandaged Dialysis Access: TDC in R chest, L AVF + bruit, two glued incisions  Filed Weights   12/31/22 1800 01/01/23 0500 01/04/23 0500  Weight: 120.3 kg 121 kg 121 kg    Intake/Output Summary (Last 24 hours) at 01/05/2023 1407 Last data filed at 01/04/2023 1812 Gross per 24 hour  Intake --  Output 0 ml  Net 0 ml    Additional Objective Labs: Basic Metabolic Panel: Recent Labs  Lab 01/02/23 0443 01/03/23 1521 01/05/23 0705  NA 136 134* 133*  K 3.2* 3.2* 3.2*  CL 100 100 100  CO2 26 24 25   GLUCOSE 129* 116* 121*  BUN 30* 36* 20  CREATININE 4.49* 4.72* 4.26*  CALCIUM 8.3* 7.8* 8.2*  PHOS 3.7 3.9 2.7   Liver Function Tests: Recent Labs  Lab 01/02/23 0443 01/03/23 1521 01/05/23 0705  ALBUMIN 1.9* 1.8* 1.9*   No results for input(s): "LIPASE", "AMYLASE" in the last 168 hours. CBC: Recent Labs  Lab 12/31/22 1505 01/01/23 1736 01/02/23 0443 01/03/23 0425 01/05/23 0705  WBC 18.2* 18.9* 18.8* 14.9* 13.5*  NEUTROABS  --   --   --   --  9.2*  HGB 7.6* 7.7* 7.8* 8.3* 7.0*  HCT 25.9* 25.1* 25.8* 27.3* 23.1*   MCV 95.9 93.0 95.9 96.5 96.7  PLT 362 387 413* 407* 384   Blood Culture    Component Value Date/Time   SDES BLOOD SITE NOT SPECIFIED 11/23/2022 0023   SPECREQUEST  11/23/2022 0023    BOTTLES DRAWN AEROBIC AND ANAEROBIC Blood Culture adequate volume   CULT  11/23/2022 0023    NO GROWTH 5 DAYS Performed at [facility], [address], [place], [place] [address]    REPTSTATUS 11/28/2022 FINAL 11/23/2022 0023    Cardiac Enzymes: No results for input(s): "CKTOTAL", "CKMB", "CKMBINDEX", "TROPONINI" in the last 168 hours. CBG: Recent Labs  Lab 01/04/23 1118 01/04/23 1639 01/04/23 1949 01/05/23 0728 01/05/23 1132  GLUCAP 113* 106* 133* 130* 154*   Iron Studies: No results for input(s): "IRON", "TIBC", "TRANSFERRIN", "FERRITIN" in the last 72 hours. Lab Results  Component Value Date   INR 1.9 (H) 12/15/2022   INR 1.8 (H) 11/23/2022   INR 1.1 10/08/2020   Studies/Results: No results found.  Medications:  anticoagulant sodium citrate      (feeding supplement) PROSource Plus  30 mL Oral BID BM   allopurinol  100 mg Oral BID   vitamin C  1,000 mg Oral Daily   atorvastatin  10 mg Oral  QHS   darbepoetin (ARANESP) injection - DIALYSIS  200 mcg Subcutaneous Q Fri-1800   docusate sodium  100 mg Oral Daily   doxercalciferol  7 mcg Intravenous Q M,W,F-HD   feeding supplement (NEPRO CARB STEADY)  237 mL Oral BID BM   ferric citrate  420 mg Oral TID WC   folic acid  1 mg Oral Daily   Gerhardt's butt cream   Topical BID   insulin aspart  0-6 Units Subcutaneous TID WC   insulin aspart  2 Units Subcutaneous TID WC   insulin glargine-yfgn  8 Units Subcutaneous QHS   nutrition supplement (JUVEN)  1 packet Oral BID BM   pantoprazole  40 mg Oral Daily   sucralfate  1 g Oral TID WC & HS   valbenazine  40 mg Oral Daily   zinc sulfate  220 mg Oral Daily    Dialysis Orders: MWF [facility]  4hr, 180dialyzer, 400/800, EDW 126.9kg, 3K/2.5Ca bath, TDC in R chest, 1st stage LUE BVT  06/2022 - mircera 225 mcg last 10/5 - hectorol 7 mcg  Assessment/Plan: ESRD: Continue HD on MWF schedule - next HD tonight. Hypokalemia: Using 4K bath with HD. Checking labs in AM. May need to add supplementation. Right foot osteomyelitis/abscess s/p R BKA on 12/29/22: Per ortho.  BP/volume: BP stable, edema improving. Will have lower EDW on d/c s/p amputation. Anemia (ABLA/GIB + ESRD): S/p EGD Multiple gastric AVMs, treated with APC and capsule endo 10/17 showing polyps only. Colonoscopy recommended. Getting Aranesp q Friday here. Hgb now 7, will order 1 unit PRBCs to be given with HD tonight. Secondary Hyperparathyroidism/Hyperphosphatemia: CorrCa okay but phos dropped, will hold binders for now. Monitor trend. Vascular access: TDC in place, s/p AVF revision/transposition 10/25. AMS/acute toxic encephalopathy (resolved): Med induced, likely. MRI without acute findings.  T2DM: Insulin per primary. Nutrition: Alb very low, continue supplements. Dispo: Discussed with Renal Navigator who informed me her outpatient HD center may be changed to either [facility] or [facility] d/t recent acceptance to [place]. Will order for patient to be placed in recliner on HD days moving forward to ensure she can tolerate being in the chair for HD.  Salome Holmes, NP [facility] 01/05/2023,2:07 PM  LOS: 22 days

## 2023-01-05 NOTE — TOC Progression Note (Signed)
Transition of Care [facility]) - Progression Note    Patient Details  Name: Deborah [name] MRN: [id number] Date of Birth: Oct 17, 1965  Transition of Care [facility]) CM/SW Contact  Erin Sons, [place] Phone Number: 01/05/2023, 2:03 PM  Clinical Narrative:       Met with pt for SNF choice. She chose [facility]. [facility] will require her HD clinic to temporarily be changed to [facility] or [place] locations. CSW notified renal navigator.                Social Determinants of Health (SDOH) Interventions SDOH Screenings   Food Insecurity: No Food Insecurity (12/15/2022)  Housing: Patient Declined (12/15/2022)  Transportation Needs: No Transportation Needs (12/15/2022)  Utilities: Not At Risk (12/15/2022)  Depression (PHQ2-9): High Risk (11/18/2022)  Financial Resource Strain: Low Risk (03/23/2021)   Received from [facility])  Physical Activity: Not on File (10/31/2017)   Received from [place], [place]  Social Connections: Unknown (07/19/2021)   Received from [facility], [facility]  Stress: Low Risk (03/23/2021)   Received from [facility] ([facility])  Tobacco Use: Low Risk  (12/31/2022)    Readmission Risk Interventions    11/25/2022    5:16 PM 07/05/2022    1:20 PM  Readmission Risk Prevention Plan  Transportation Screening Complete Complete  Medication Review (RN Care Manager) Complete Referral to Pharmacy  PCP or Specialist appointment within 3-5 days of discharge Complete Complete  HRI or Home Care Consult Complete Complete  SW Recovery Care/Counseling Consult Complete Complete  Palliative Care Screening Not Applicable Not Applicable  Skilled Nursing Facility Not Applicable Not Applicable

## 2023-01-05 NOTE — Progress Notes (Signed)
Copy of HD order for the purpose of requesting new clinic at d/c due to snf placemen (transfer request).     Hemodialysis inpatient (Order [id number]) Dialysis Date: 01/04/2023 Department: [facility] 17M KIDNEY UNIT Ordering/Authorizing: Berenda Morale, NP     Berenda Morale, NP NPI: [id number]    Patient Information  Patient Name Deborah, [name] Legal Sex Female DOB Apr 23, 1965 SSN [id number]   Order Information  Order Date/Time Release Date/Time Start Date/Time End Date/Time  01/04/23 02:29 PM None 01/05/23 12:00 AM 01/05/23 12:00 AM   Order History Inpatient Date/Time Action Taken User Additional Information  01/04/23 1429 Sign Berenda Morale, NP   01/04/23 1429 Release Instance Berenda Morale, NP (auto-released) Released [id number]  01/04/23 2225 Acknowledge B[place]agon, Marina Gravel, RN New Order   Order Questions  Question Answer  Mode HD  K+ 4 meq  Ca++ 2.25 meq  Na+ 138 meq  HCO3- (Bicarb) 39  Dialyzer Revaclear 400  Dialysate Temperature (C) 37  BFR-As tolerated to a maximum of: 400 mL/min  DFR 300 ml/min  Duration of Treatment 3.5 Hours  Dry weight (kg) or fluid removal (L) As Tolerated 1-2L as tolerated  Access Site AVF  Needle gauge 15 G  Needle Length 1 in.  HD Standing Orders Yes        Comments  Keep SBP > 90 No heparin (GIB), Get CBC/RFP if not yet done Obtain pre/post accurate bed weights please         Reference Links         Standing Order Information  Remaining Occurrences Interval Last Released    0/1 Tomorrow 01/04/2023             Hemodialysis inpatient: Patient Communication   Not Released  Not seen    Collection Information         Encounter  View Encounter            Tracking Links  Cosign Tracking Order Transmittal Tracking

## 2023-01-05 NOTE — Progress Notes (Signed)
Physical Therapy Treatment Patient Details Name: Deborah [name] MRN: [id number] DOB: 11-17-1965 Today's Date: 01/05/2023   History of Present Illness Pt is a 57 y/o F admitted on 12/14/22 after presenting with c/o abdominal pain. Pt found to have Hgb of 5.8. Pt is being treated for acute anemia 2/2 GI bleed/blood loss on chronic anemia in setting of ESRD. While admitted pt found to have R ankle osteomyelitis. Pt is s/p R BKA on 12/29/22. PMH: ESRD on HD MWF, PAF on Eliquis, diastolic CHF, HTN, DM2, malignant duodenal carcinoid, NAFLD, morbid obesity, chronic R ankle ulcer    PT Comments  Pt seen for PT tx with pt agreeable to tx. Pt reports she hasn't sat EOB since sx but willing to attempt today. Pt is able to complete supine<>sit with HOB elevated, bed rails with supervision & extra time. Pt tolerates sitting EOB ~10 min without LOB. PT began educating pt on head/hips relationship, LLE positioning, and sequencing for scooting along EOB but pt with decreased ability to scoot even with max assist. Pt does endorse dizziness upon sitting EOB but notes it slowly gets better. Will continue to follow pt acutely to progress mobility as able.    If plan is discharge home, recommend the following: Two people to help with walking and/or transfers;A lot of help with bathing/dressing/bathroom;Assist for transportation;Supervision due to cognitive status;Help with stairs or ramp for entrance;Direct supervision/assist for medications management;Direct supervision/assist for financial management   Can travel by private vehicle     No  Equipment Recommendations  Other (comment) (defer to next venue)    Recommendations for Other Services       Precautions / Restrictions Precautions Precautions: Fall Precaution Comments: sacral wounds, bilateral heel wounds, R BKA 12/29/22, R BKA limb guard Restrictions Weight Bearing Restrictions: Yes RLE Weight Bearing: Weight bearing as tolerated     Mobility   Bed Mobility Overal bed mobility: Needs Assistance Bed Mobility: Supine to Sit, Sit to Supine Rolling: Used rails, Supervision   Supine to sit: Supervision, Used rails, HOB elevated Sit to supine: Supervision, HOB elevated, Used rails   General bed mobility comments: Pt somewhat irritated that PT does not assist her with performing bed mobility despite pt able to complete with supervision & extra time.    Transfers                        Ambulation/Gait                   Stairs             Wheelchair Mobility     Tilt Bed    Modified Rankin (Stroke Patients Only)       Balance Overall balance assessment: Needs assistance Sitting-balance support: Feet supported, Bilateral upper extremity supported Sitting balance-Leahy Scale: Fair Sitting balance - Comments: supervision static sitting                                    Cognition Arousal: Alert Behavior During Therapy:  (poor frustration tolerance) Overall Cognitive Status: Difficult to assess Area of Impairment: Safety/judgement, Awareness                       Following Commands: Follows one step commands consistently Safety/Judgement: Decreased awareness of safety, Decreased awareness of deficits Awareness: Emergent   General Comments: Pt is oriented x 4 but appears  to have some short term memory deficits.        Exercises      General Comments        Pertinent Vitals/Pain Pain Assessment Pain Assessment: Faces Faces Pain Scale: Hurts little more Pain Location: R BKA surgical site Pain Descriptors / Indicators: Grimacing, Guarding, Discomfort Pain Intervention(s): Monitored during session    Home Living                          Prior Function            PT Goals (current goals can now be found in the care plan section) Acute Rehab PT Goals Patient Stated Goal: decreased pain PT Goal Formulation: With patient Time For Goal  Achievement: 01/13/23 Potential to Achieve Goals: Fair Additional Goals Additional Goal #1: Pt will propel w/c 150 ft with supervision for increased independence with mobility. Progress towards PT goals: Progressing toward goals    Frequency    Min 1X/week      PT Plan      Co-evaluation              AM-PAC PT "6 Clicks" Mobility   Outcome Measure  Help needed turning from your back to your side while in a flat bed without using bedrails?: A Little Help needed moving from lying on your back to sitting on the side of a flat bed without using bedrails?: A Lot Help needed moving to and from a bed to a chair (including a wheelchair)?: Total Help needed standing up from a chair using your arms (e.g., wheelchair or bedside chair)?: Total Help needed to walk in hospital room?: Total Help needed climbing 3-5 steps with a railing? : Total 6 Click Score: 9    End of Session   Activity Tolerance: Patient tolerated treatment well Patient left: in bed;with call bell/phone within reach;with bed alarm set;with nursing/sitter in room   PT Visit Diagnosis: Muscle weakness (generalized) (M62.81);Difficulty in walking, not elsewhere classified (R26.2);Pain;Other abnormalities of gait and mobility (R26.89) Pain - Right/Left: Right Pain - part of body: Leg     Time: 6295-2841 PT Time Calculation (min) (ACUTE ONLY): 26 min  Charges:    $Therapeutic Activity: 23-37 mins PT General Charges $$ ACUTE PT VISIT: 1 Visit                     Aleda Grana, PT, DPT 01/05/23, 9:18 AM   [name] 01/05/2023, 9:17 AM

## 2023-01-05 NOTE — Procedures (Signed)
HD Note:  Some information was entered later than the data was gathered due to patient care needs. The stated time with the data is accurate.  Received patient in bed to unit.   Alert and oriented.   Informed consent signed and in chart.   Access used: Upper right chest HD catheter Access issues: None   Patient complained of pain and anxiety upon arrival to the unit.  Medication given for  pain, see MAR.  Blood transfusion started prior to giving report to oncoming dialysis nurse.  Tashaya Ancrum L. Dareen Piano, RN [facility].

## 2023-01-05 NOTE — Hospital Course (Signed)
57 year old with a history of ESRD on HD MWF, PAF on Eliquis, diastolic CHF, HTN, DM2, malignant duodenal carcinoid, NAFLD, morbid obesity, and a chronic right ankle ulcer who had recently been admitted 9/16-9/24/2024 at which time she underwent an angiogram noting adequate perfusion to her right foot with ultimate disposition being treatment for gout of the right ankle.  She returned to the ER 10/8 and was found to have a hemoglobin of 5.8 with crampy abdominal pain and a history of BRBPR.   Since the time of her admission she has experienced recurrent GI bleeding.  Ultimately she was taken for an EGD and capsule endoscopy.  Additionally she has been confirmed to have osteomyelitis of the right ankle with plans for right BKA 10/23.

## 2023-01-05 NOTE — Progress Notes (Signed)
Contacted by CSW with request that pt's HD clinic be changed due to snf placement. Transfer request submitted to [facility] admissions for review. Requested [facility] or [facility] per snf request. Update provided to nephrologist and renal NP as well. Will assist as needed.   Olivia Canter Renal Navigator [phone]

## 2023-01-06 DIAGNOSIS — I5032 Chronic diastolic (congestive) heart failure: Secondary | ICD-10-CM

## 2023-01-06 DIAGNOSIS — I482 Chronic atrial fibrillation, unspecified: Secondary | ICD-10-CM

## 2023-01-06 DIAGNOSIS — E1143 Type 2 diabetes mellitus with diabetic autonomic (poly)neuropathy: Secondary | ICD-10-CM

## 2023-01-06 DIAGNOSIS — G2401 Drug induced subacute dyskinesia: Secondary | ICD-10-CM | POA: Insufficient documentation

## 2023-01-06 DIAGNOSIS — K31819 Angiodysplasia of stomach and duodenum without bleeding: Secondary | ICD-10-CM

## 2023-01-06 DIAGNOSIS — M86171 Other acute osteomyelitis, right ankle and foot: Secondary | ICD-10-CM | POA: Diagnosis not present

## 2023-01-06 DIAGNOSIS — D649 Anemia, unspecified: Secondary | ICD-10-CM | POA: Diagnosis not present

## 2023-01-06 DIAGNOSIS — K3184 Gastroparesis: Secondary | ICD-10-CM

## 2023-01-06 LAB — CBC
HCT: 27.9 % — ABNORMAL LOW (ref 36.0–46.0)
Hemoglobin: 8.4 g/dL — ABNORMAL LOW (ref 12.0–15.0)
MCH: 28.6 pg (ref 26.0–34.0)
MCHC: 30.1 g/dL (ref 30.0–36.0)
MCV: 94.9 fL (ref 80.0–100.0)
Platelets: 392 10*3/uL (ref 150–400)
RBC: 2.94 MIL/uL — ABNORMAL LOW (ref 3.87–5.11)
RDW: 15.8 % — ABNORMAL HIGH (ref 11.5–15.5)
WBC: 22 10*3/uL — ABNORMAL HIGH (ref 4.0–10.5)
nRBC: 0 % (ref 0.0–0.2)

## 2023-01-06 LAB — IRON AND TIBC
Iron: 54 ug/dL (ref 28–170)
Saturation Ratios: 34 % — ABNORMAL HIGH (ref 10.4–31.8)
TIBC: 160 ug/dL — ABNORMAL LOW (ref 250–450)
UIBC: 106 ug/dL

## 2023-01-06 LAB — BASIC METABOLIC PANEL
Anion gap: 8 (ref 5–15)
BUN: 9 mg/dL (ref 6–20)
CO2: 28 mmol/L (ref 22–32)
Calcium: 8.3 mg/dL — ABNORMAL LOW (ref 8.9–10.3)
Chloride: 97 mmol/L — ABNORMAL LOW (ref 98–111)
Creatinine, Ser: 2.94 mg/dL — ABNORMAL HIGH (ref 0.44–1.00)
GFR, Estimated: 18 mL/min — ABNORMAL LOW (ref >60)
Glucose, Bld: 118 mg/dL — ABNORMAL HIGH (ref 70–99)
Potassium: 3.5 mmol/L (ref 3.5–5.1)
Sodium: 133 mmol/L — ABNORMAL LOW (ref 135–145)

## 2023-01-06 LAB — GLUCOSE, CAPILLARY
Glucose-Capillary: 107 mg/dL — ABNORMAL HIGH (ref 70–99)
Glucose-Capillary: 143 mg/dL — ABNORMAL HIGH (ref 70–99)
Glucose-Capillary: 161 mg/dL — ABNORMAL HIGH (ref 70–99)
Glucose-Capillary: 155 mg/dL — ABNORMAL HIGH (ref 70–99)

## 2023-01-06 MED ORDER — METOPROLOL TARTRATE 50 MG PO TABS
50.0000 mg | ORAL_TABLET | Freq: Two times a day (BID) | ORAL | Status: DC
  Administered 2023-01-06 – 2023-01-09 (×5): 50 mg via ORAL
  Filled 2023-01-06 (×8): qty 1

## 2023-01-06 NOTE — Assessment & Plan Note (Signed)
BMI 43 

## 2023-01-06 NOTE — Assessment & Plan Note (Signed)
New diagnosis - Continue Ingrezza

## 2023-01-06 NOTE — Assessment & Plan Note (Signed)
BP stable. Continue metoprolol

## 2023-01-06 NOTE — Progress Notes (Addendum)
[facility] Progress Note   Subjective:    Patient seen sitting at side of bed talking in the phone. Denies any acute issues. Tolerated yesterday's HD with net UF 2L. Also received 1 unit PRBCs with HD and repeat Hgb is now 8.4. Next HD 01/07/23 in the recliner.  Objective Vitals:   01/05/23 2318 01/05/23 2330 01/06/23 0522 01/06/23 0908  BP: (!) 162/142 124/72 127/60 (!) 114/53  Pulse: (!) 113 63 (!) 104 98  Resp: 20  18 18   Temp: 98.5 F (36.9 C)  97.7 F (36.5 C) 98.2 F (36.8 C)  TempSrc:      SpO2: 99%  100% 100%  Weight:      Height:       Physical Exam General: Chronically ill appearing woman, NAD. Room air. Heart: RRR; no murmur Lungs: CTA throughout, no wheezing Abdomen: soft Extremities: R BKA in hard brace, trace LLE edema with heel bandaged Dialysis Access: TDC in R chest, L AVF + bruit, two glued incisions  Filed Weights   12/31/22 1800 01/01/23 0500 01/04/23 0500  Weight: 120.3 kg 121 kg 121 kg    Intake/Output Summary (Last 24 hours) at 01/06/2023 1326 Last data filed at 01/06/2023 0900 Gross per 24 hour  Intake 340 ml  Output 2000 ml  Net -1660 ml    Additional Objective Labs: Basic Metabolic Panel: Recent Labs  Lab 01/02/23 0443 01/03/23 1521 01/05/23 0705 01/06/23 0431  NA 136 134* 133* 133*  K 3.2* 3.2* 3.2* 3.5  CL 100 100 100 97*  CO2 26 24 25 28   GLUCOSE 129* 116* 121* 118*  BUN 30* 36* 20 9  CREATININE 4.49* 4.72* 4.26* 2.94*  CALCIUM 8.3* 7.8* 8.2* 8.3*  PHOS 3.7 3.9 2.7  --    Liver Function Tests: Recent Labs  Lab 01/02/23 0443 01/03/23 1521 01/05/23 0705  ALBUMIN 1.9* 1.8* 1.9*   No results for input(s): "LIPASE", "AMYLASE" in the last 168 hours. CBC: Recent Labs  Lab 01/01/23 1736 01/02/23 0443 01/03/23 0425 01/05/23 0705 01/06/23 0431  WBC 18.9* 18.8* 14.9* 13.5* 22.0*  NEUTROABS  --   --   --  9.2*  --   HGB 7.7* 7.8* 8.3* 7.0* 8.4*  HCT 25.1* 25.8* 27.3* 23.1* 27.9*  MCV 93.0 95.9 96.5  96.7 94.9  PLT 387 413* 407* 384 392   Blood Culture    Component Value Date/Time   SDES BLOOD SITE NOT SPECIFIED 11/23/2022 0023   SPECREQUEST  11/23/2022 0023    BOTTLES DRAWN AEROBIC AND ANAEROBIC Blood Culture adequate volume   CULT  11/23/2022 0023    NO GROWTH 5 DAYS Performed at [facility], [address], [place], [place] [address]    REPTSTATUS 11/28/2022 FINAL 11/23/2022 0023    Cardiac Enzymes: No results for input(s): "CKTOTAL", "CKMB", "CKMBINDEX", "TROPONINI" in the last 168 hours. CBG: Recent Labs  Lab 01/05/23 1132 01/05/23 1622 01/05/23 2317 01/06/23 0729 01/06/23 1110  GLUCAP 154* 176* 115* 107* 143*   Iron Studies:  Recent Labs    01/06/23 0431  IRON 54  TIBC 160*   Lab Results  Component Value Date   INR 1.9 (H) 12/15/2022   INR 1.8 (H) 11/23/2022   INR 1.1 10/08/2020   Studies/Results: No results found.  Medications:  anticoagulant sodium citrate      (feeding supplement) PROSource Plus  30 mL Oral BID BM   sodium chloride   Intravenous Once   allopurinol  100 mg Oral BID  vitamin C  1,000 mg Oral Daily   atorvastatin  10 mg Oral QHS   darbepoetin (ARANESP) injection - DIALYSIS  200 mcg Subcutaneous Q Fri-1800   docusate sodium  100 mg Oral Daily   doxercalciferol  7 mcg Intravenous Q M,W,F-HD   feeding supplement (NEPRO CARB STEADY)  237 mL Oral BID BM   folic acid  1 mg Oral Daily   Gerhardt's butt cream   Topical BID   insulin aspart  0-6 Units Subcutaneous TID WC   insulin aspart  2 Units Subcutaneous TID WC   insulin glargine-yfgn  8 Units Subcutaneous QHS   LORazepam  1 mg Oral Once   metoprolol tartrate  50 mg Oral BID   nutrition supplement (JUVEN)  1 packet Oral BID BM   pantoprazole  40 mg Oral Daily   sucralfate  1 g Oral TID WC & HS   valbenazine  40 mg Oral Daily   zinc sulfate  220 mg Oral Daily    Dialysis Orders: [facility]  4hr, 180dialyzer, 400/800, EDW 126.9kg, 3K/2.5Ca bath, TDC in R chest, 1st  stage LUE BVT 06/2022 - mircera 225 mcg last 10/5 - hectorol 7 mcg  Assessment/Plan: ESRD: Continue HD on MWF schedule - next HD 11/1 in the recliner. Hypokalemia: Using 4K bath with HD. K+ now 3.5. May need to add supplementation. Right foot osteomyelitis/abscess s/p R BKA on 12/29/22: Per ortho.  BP/volume: BP stable, edema improving and now under EDW. Will have lower EDW on d/c s/p amputation. Anemia (ABLA/GIB + ESRD): S/p EGD Multiple gastric AVMs, treated with APC and capsule endo 10/17 showing polyps only. Colonoscopy recommended. Getting Aranesp q Friday here. S/p 1 unit PRBCs with HD 10/30, Hgb now is 8.4. Secondary Hyperparathyroidism/Hyperphosphatemia: CorrCa okay but phos dropped, will hold binders for now. Monitor trend. Vascular access: TDC in place, s/p AVF revision/transposition 10/25. AMS/acute toxic encephalopathy (resolved): Med induced, likely. MRI without acute findings.  T2DM: Insulin per primary. Nutrition: Alb very low, continue supplements. Dispo: Discussed with Renal Navigator who informed me her outpatient HD center may be changed to [facility] or [facility] d/t recent acceptance to [place]. Will order for patient to be placed in recliner on HD days moving forward to ensure she can tolerate being in the chair for HD.  Salome Holmes, NP [facility] 01/06/2023,1:26 PM  LOS: 23 days

## 2023-01-06 NOTE — Assessment & Plan Note (Signed)
-   Continue metoprolol - Hold anticoagulation

## 2023-01-06 NOTE — Progress Notes (Signed)
Contacted [facility] admissions to request an update on pt's transfer request. Awaiting response. Pt to have HD in recliner tomorrow per note/order. Will assist as needed.   Olivia Canter Renal Navigator [phone]

## 2023-01-06 NOTE — Progress Notes (Signed)
Occupational Therapy Treatment Patient Details Name: Deborah [name] MRN: [id number] DOB: [birth year] Today's Date: 01/06/2023   History of present illness Pt is a 57 y/o F admitted on 12/14/22 after presenting with c/o abdominal pain. Pt found to have Hgb of 5.8. Pt is being treated for acute anemia 2/2 GI bleed/blood loss on chronic anemia in setting of ESRD. While admitted pt found to have R ankle osteomyelitis. Pt is s/p R BKA on 12/29/22. PMH: ESRD on HD MWF, PAF on Eliquis, diastolic CHF, HTN, DM2, malignant duodenal carcinoid, NAFLD, morbid obesity, chronic R ankle ulcer   OT comments  Pt sitting on EOB upon arrival, pleasant and conversational. Pt stated she was tired today from earlier PT session scooting/attempting standing with Stedy. Pt agreed to EOB BUE exercises with theraband and squeeze ball to improve BUE strength to aide with scooting for lateral transfers and pushing up to stand. Pt instructed with green theraband, limited ability to perform L shoulder flexion/ABD due to anxiety with lifting arm, even passively. Pt able to complete most exercises with increased verbal cueing for technique. Pt left on bedside with call bell within reach. Will continue working on BUE strength and scooting for lateral transfers to improve functional independence.       If plan is discharge home, recommend the following:  Two people to help with walking and/or transfers;Two people to help with bathing/dressing/bathroom;Assistance with cooking/housework;Help with stairs or ramp for entrance;Assist for transportation   Equipment Recommendations  Other (comment)    Recommendations for Other Services      Precautions / Restrictions Precautions Precautions: Fall Precaution Comments: sacral wounds, bilateral heel wounds, R BKA 12/29/22, R BKA limb guard Restrictions Weight Bearing Restrictions: Yes RLE Weight Bearing: Non weight bearing       Mobility Bed Mobility Overal bed mobility:  Needs Assistance             General bed mobility comments: at EOB upon entry    Transfers Overall transfer level: Needs assistance                 General transfer comment: refused more scooting/lateral transfers today     Balance Overall balance assessment: Needs assistance Sitting-balance support: Feet supported Sitting balance-Leahy Scale: Fair Sitting balance - Comments: sitting EOB unsupported, able to lean L/R while maintaining balance     Standing balance-Leahy Scale: Zero Standing balance comment: stated she tried standing with stedy earlier, refused furhter attempts                           ADL either performed or assessed with clinical judgement   ADL Overall ADL's : Needs assistance/impaired Eating/Feeding: Set up;Sitting   Grooming: Set up;Sitting   Upper Body Bathing: Moderate assistance;Sitting       Upper Body Dressing : Moderate assistance;Sitting                     General ADL Comments: improving with sitting balane on EOB, BUE tasks, L shoudler still limited in AROM.    Extremity/Trunk Assessment Upper Extremity Assessment Upper Extremity Assessment: Generalized weakness LUE Deficits / Details: L shoulder stiffness, difficulty to assess due to anxiety with moving. LUE: Shoulder pain with ROM LUE Sensation: WNL LUE Coordination: decreased gross motor            Vision       Perception     Praxis      Cognition Arousal: Alert Behavior  During Therapy: WFL for tasks assessed/performed Overall Cognitive Status: Within Functional Limits for tasks assessed                                 General Comments: in good spirits today        Exercises Exercises: General Upper Extremity General Exercises - Upper Extremity Shoulder Flexion: AROM, 10 reps, Theraband Theraband Level (Shoulder Flexion): Level 3 (Green) Shoulder ABduction: 10 reps, Theraband, AROM Theraband Level (Shoulder Abduction):  Level 3 (Green) Shoulder Horizontal ABduction: AROM, 10 reps, Theraband Theraband Level (Shoulder Horizontal Abduction): Level 3 (Green) Elbow Flexion: AROM, 10 reps, Theraband Theraband Level (Elbow Flexion): Level 3 (Green) Elbow Extension: AROM, 10 reps, Theraband Theraband Level (Elbow Extension): Level 3 (Green) Digit Composite Flexion: Squeeze ball    Shoulder Instructions       General Comments      Pertinent Vitals/ Pain       Pain Assessment Pain Assessment: Faces Faces Pain Scale: Hurts little more Pain Location: R BKA surgical site, bottom in sitting Pain Descriptors / Indicators: Grimacing, Guarding, Discomfort Pain Intervention(s): Limited activity within patient's tolerance, Monitored during session  Home Living                                          Prior Functioning/Environment              Frequency  Min 1X/week        Progress Toward Goals  OT Goals(current goals can now be found in the care plan section)  Progress towards OT goals: Progressing toward goals  Acute Rehab OT Goals Patient Stated Goal: to improve BUE strength OT Goal Formulation: With patient Time For Goal Achievement: 01/13/23 Potential to Achieve Goals: Good ADL Goals Pt Will Perform Grooming: with set-up;sitting Pt Will Perform Lower Body Bathing: with mod assist;sitting/lateral leans Pt Will Perform Lower Body Dressing: with mod assist;with adaptive equipment;sitting/lateral leans Pt Will Transfer to Toilet: with mod assist;squat pivot transfer;bedside commode Pt Will Perform Toileting - Clothing Manipulation and hygiene: with mod assist;sitting/lateral leans Pt/caregiver will Perform Home Exercise Program: Both right and left upper extremity;Independently;Increased strength Additional ADL Goal #1: Pt will be able to complete bed mobility to assist to EOB with min A to prepare for transfers/ADLs. Additional ADL Goal #2: [id number]  Plan       Co-evaluation                 AM-PAC OT "6 Clicks" Daily Activity     Outcome Measure   Help from another person eating meals?: A Little Help from another person taking care of personal grooming?: A Little Help from another person toileting, which includes using toliet, bedpan, or urinal?: A Lot Help from another person bathing (including washing, rinsing, drying)?: A Little Help from another person to put on and taking off regular upper body clothing?: A Lot Help from another person to put on and taking off regular lower body clothing?: Total 6 Click Score: 14    End of Session    OT Visit Diagnosis: Unsteadiness on feet (R26.81);Other abnormalities of gait and mobility (R26.89);Muscle weakness (generalized) (M62.81);Pain Pain - Right/Left: Right Pain - part of body: Leg   Activity Tolerance Patient limited by fatigue   Patient Left in bed;with call bell/phone within reach   Nurse Communication Mobility status  Time: 4401-0272 OT Time Calculation (min): 17 min  Charges: OT General Charges $OT Visit: 1 Visit OT Treatments $Therapeutic Exercise: 8-22 mins  [address] OTR/L   Alexis Goodell 01/06/2023, 4:14 PM

## 2023-01-06 NOTE — Progress Notes (Signed)
Progress Note   Patient: Deborah [name] QIH:[id number] DOB: 04-Apr-1965 DOA: 12/14/2022     23 DOS: the patient was seen and examined on 01/06/2023 at 1:25 PM      Brief hospital course: 57 y.o. F with ESRD on HD MWF, cAF on Eliquis, dCHF, DM, gastroparesis and low grade neuroendocrine tumor of duodenum, HTN, MO, NASH and chronic right ankle ulcer, recently admitted for ankle pain/swelling, underwent angiogram, ultimately treated for gout, who returned with BRBPR and was found to have Hgb 5.8.    Underwent EGD and SCE that showed Since the time of her admission she has experienced recurrent GI bleeding.  Ultimately she was taken for an EGD and capsule endoscopy that showed AVMs.  Also found to have osteomyelitis of the right ankle and underwent BKA on 10/23     Assessment and Plan: * Symptomatic anemia    Gastric AVM See prior summary from Dr. [name] Seller. - Continue PPI - Carafate until Nov 1 - Outpatient colonoscopy - Hold anticoagulation - Outpatient GI follow up    Acute osteomyelitis of right calcaneus [facility]) S/p right BKA on 10/23 by Dr. Lajoyce Corners - Outpatient orthopedics follow up  Morbid obesity (HCC) BMI 43  Tardive dyskinesia New diagnosis - Continue Ingrezza  (HFpEF) heart failure with preserved ejection fraction (HCC) - Volume status per HD  Chronic a-fib (HCC) - Continue metoprolol - Hold anticoagulation  End stage renal disease on dialysis [facility]) - Consult Nephrology for maintenance HD - Continue Aranesp, Hectorol  Hyponatremia    NASH (nonalcoholic steatohepatitis)    Chronic diarrhea    Diabetic gastroparesis (HCC)    Insulin dependent type 2 diabetes mellitus (HCC) Glucose normal - Continue glargine - Continue SS corrections  Essential hypertension BP stable - Continue metoprolol          Subjective: Patient is a lot of pain but this is controlled with IV Dilaudid.  She has had no further bleeding, she has no dizziness, no  confusion.    Physical Exam: BP (!) 114/53 (BP Location: Right Arm)   Pulse 98   Temp 98.2 F (36.8 C)   Resp 18   Ht 5\' 6"  (1.676 m)   Wt 121 kg   LMP 10/10/2012   SpO2 100%   BMI 43.06 kg/m   Obese adult female, sitting up in bed, interactive, tired RRR, no murmurs, no peripheral edema Respiratory rate normal, lungs clear without rales or wheezes Shrinker on right leg, appears stable and intact, no pitting in the left leg Abdomen soft no tenderness palpation Attention diminished, affect blunted, judgment and insight appear at baseline    Data Reviewed: Basic metabolic panel shows mild hyponatremia, stable, potassium up to 3.5 creatinine 2.9 Glucose well-controlled CBC shows white count to 22, hemoglobin up to 8.3   Family Communication: None present    Disposition: Status is: Inpatient         Author: Alberteen Sam, MD 01/06/2023 4:22 PM  For on call review [email or web address].

## 2023-01-06 NOTE — Assessment & Plan Note (Signed)
S/p right BKA on 10/23 by Dr. Lajoyce Corners - Outpatient orthopedics follow up

## 2023-01-06 NOTE — Progress Notes (Addendum)
   01/05/23 2215  Vitals  Temp 98.7 F (37.1 C)  Temp Source Axillary  BP (!) 86/69  MAP (mmHg) 76  BP Location Right Wrist  BP Method Automatic  Patient Position (if appropriate) Lying  Pulse Rate (!) 117  Pulse Rate Source Monitor  ECG Heart Rate (!) 118  Resp 14  Oxygen Therapy  SpO2 100 %  O2 Device Room Air  During Treatment Monitoring  Blood Flow Rate (mL/min) 0 mL/min  Arterial Pressure (mmHg) -16.16 mmHg  Venous Pressure (mmHg) 51.51 mmHg  TMP (mmHg) -5.66 mmHg  Ultrafiltration Rate (mL/min) 759 mL/min  Dialysate Flow Rate (mL/min) 299 ml/min  Dialysate Potassium Concentration 4  Dialysate Calcium Concentration 2.25  Duration of HD Treatment -hour(s) 3.5 hour(s)  Cumulative Fluid Removed (mL) per Treatment  2000.13  HD Safety Checks Performed Yes  Intra-Hemodialysis Comments Tx completed  Post Treatment  Dialyzer Clearance Clear  Liters Processed 74  Fluid Removed (mL) 2000 mL  Tolerated HD Treatment Yes  Hemodialysis Catheter Right Subclavian Double lumen Permanent (Tunneled)  Placement Date/Time: 06/24/22 1644   Serial / Lot #: [id number]  Expiration Date: 09/08/26  Time Out: Correct patient;Correct site;Correct procedure  Maximum sterile barrier precautions: Hand hygiene;Mask;Cap;Sterile gloves;Sterile gown;Large sterile ...  Site Condition No complications  Blue Lumen Status Heparin locked  Red Lumen Status Heparin locked  Catheter fill solution Heparin 1000 units/ml  Catheter fill volume (Arterial) 1.6 cc  Catheter fill volume (Venous) 1.6  Dressing Type Transparent  Dressing Status Clean, Dry, Intact  Post treatment catheter status Capped and Clamped   1 Unit PRBC transfused without complications [id number], volume .Hand off to [employer].

## 2023-01-06 NOTE — Assessment & Plan Note (Signed)
See prior summary from Dr. [name] Seller. - Continue PPI - Carafate until Nov 1 - Outpatient colonoscopy - Hold anticoagulation - Outpatient GI follow up

## 2023-01-06 NOTE — Plan of Care (Signed)
  Problem: Education: Goal: Understanding of CV disease, CV risk reduction, and recovery process will improve Outcome: Progressing Goal: Individualized Educational Video(s) Outcome: Progressing   Problem: Activity: Goal: Ability to return to baseline activity level will improve Outcome: Progressing   Problem: Cardiovascular: Goal: Ability to achieve and maintain adequate cardiovascular perfusion will improve Outcome: Progressing Goal: Vascular access site(s) Level 0-1 will be maintained Outcome: Progressing   Problem: Health Behavior/Discharge Planning: Goal: Ability to safely manage health-related needs after discharge will improve Outcome: Progressing   Problem: Education: Goal: Ability to describe self-care measures that may prevent or decrease complications (Diabetes Survival Skills Education) will improve Outcome: Progressing Goal: Individualized Educational Video(s) Outcome: Progressing   Problem: Coping: Goal: Ability to adjust to condition or change in health will improve Outcome: Progressing   Problem: Fluid Volume: Goal: Ability to maintain a balanced intake and output will improve Outcome: Progressing   Problem: Health Behavior/Discharge Planning: Goal: Ability to identify and utilize available resources and services will improve Outcome: Progressing Goal: Ability to manage health-related needs will improve Outcome: Progressing   Problem: Metabolic: Goal: Ability to maintain appropriate glucose levels will improve Outcome: Progressing   Problem: Nutritional: Goal: Progress toward achieving an optimal weight will improve Outcome: Progressing   Problem: Skin Integrity: Goal: Risk for impaired skin integrity will decrease Outcome: Progressing   Problem: Tissue Perfusion: Goal: Adequacy of tissue perfusion will improve Outcome: Progressing   Problem: Education: Goal: Knowledge of General Education information will improve Description: Including pain  rating scale, medication(s)/side effects and non-pharmacologic comfort measures Outcome: Progressing   Problem: Health Behavior/Discharge Planning: Goal: Ability to manage health-related needs will improve Outcome: Progressing   Problem: Clinical Measurements: Goal: Ability to maintain clinical measurements within normal limits will improve Outcome: Progressing Goal: Will remain free from infection Outcome: Progressing Goal: Diagnostic test results will improve Outcome: Progressing Goal: Respiratory complications will improve Outcome: Progressing Goal: Cardiovascular complication will be avoided Outcome: Progressing   Problem: Activity: Goal: Risk for activity intolerance will decrease Outcome: Progressing   Problem: Nutrition: Goal: Adequate nutrition will be maintained Outcome: Progressing   Problem: Coping: Goal: Level of anxiety will decrease Outcome: Progressing   Problem: Elimination: Goal: Will not experience complications related to bowel motility Outcome: Progressing   Problem: Pain Managment: Goal: General experience of comfort will improve Outcome: Progressing   Problem: Safety: Goal: Ability to remain free from injury will improve Outcome: Progressing   Problem: Skin Integrity: Goal: Risk for impaired skin integrity will decrease Outcome: Progressing   Problem: Education: Goal: Knowledge of the prescribed therapeutic regimen will improve Outcome: Progressing Goal: Ability to verbalize activity precautions or restrictions will improve Outcome: Progressing Goal: Understanding of discharge needs will improve Outcome: Progressing   Problem: Activity: Goal: Ability to perform//tolerate increased activity and mobilize with assistive devices will improve Outcome: Progressing   Problem: Clinical Measurements: Goal: Postoperative complications will be avoided or minimized Outcome: Progressing   Problem: Self-Care: Goal: Ability to meet self-care  needs will improve Outcome: Progressing   Problem: Self-Concept: Goal: Ability to maintain and perform role responsibilities to the fullest extent possible will improve Outcome: Progressing   Problem: Pain Management: Goal: Pain level will decrease with appropriate interventions Outcome: Progressing   Problem: Skin Integrity: Goal: Demonstration of wound healing without infection will improve Outcome: Progressing

## 2023-01-06 NOTE — Assessment & Plan Note (Signed)
-   Volume status per HD

## 2023-01-06 NOTE — Assessment & Plan Note (Signed)
-   Consult Nephrology for maintenance HD - Continue Aranesp, Hectorol

## 2023-01-06 NOTE — Assessment & Plan Note (Signed)
Glucose normal - Continue glargine - Continue SS corrections

## 2023-01-06 NOTE — Progress Notes (Signed)
Physical Therapy Treatment Patient Details Name: Deborah [name] MRN: [id number] DOB: 08-18-1965 Today's Date: 01/06/2023   History of Present Illness Pt is a 57 y/o F admitted on 12/14/22 after presenting with c/o abdominal pain. Pt found to have Hgb of 5.8. Pt is being treated for acute anemia 2/2 GI bleed/blood loss on chronic anemia in setting of ESRD. While admitted pt found to have R ankle osteomyelitis. Pt is s/p R BKA on 12/29/22. PMH: ESRD on HD MWF, PAF on Eliquis, diastolic CHF, HTN, DM2, malignant duodenal carcinoid, NAFLD, morbid obesity, chronic R ankle ulcer    PT Comments  Pt greeted resting in bed and agreeable to session with continued progress towards acute goals. Noted pt wound vac not plugged into wall on arrival, RN made aware and this PTA plugging in during session. Pt requiring supervision to come to sitting EOB and demonstrating good sitting balance with single LE support. Pt able to come to partial stand in stedy frame, elevating hips from EOB, with max A with pt giving good effort, limited by decreased UE strength. Pt able to scoot along EOB with max A. Pt in good spirits this session, pleasant and participatory. Pt continues to benefit from skilled PT services to progress toward functional mobility goals.      If plan is discharge home, recommend the following: Two people to help with walking and/or transfers;A lot of help with bathing/dressing/bathroom;Assist for transportation;Supervision due to cognitive status;Help with stairs or ramp for entrance;Direct supervision/assist for medications management;Direct supervision/assist for financial management   Can travel by private vehicle     No  Equipment Recommendations  Other (comment) (defer to next venue)    Recommendations for Other Services       Precautions / Restrictions Precautions Precautions: Fall Precaution Comments: sacral wounds, bilateral heel wounds, R BKA 12/29/22, R BKA limb  guard Restrictions Weight Bearing Restrictions: Yes RLE Weight Bearing: Non weight bearing     Mobility  Bed Mobility Overal bed mobility: Needs Assistance Bed Mobility: Supine to Sit     Supine to sit: Supervision, Used rails, HOB elevated     General bed mobility comments: supervision for safety    Transfers Overall transfer level: Needs assistance Equipment used: Ambulation equipment used, None Transfers: Sit to/from Stand, Bed to chair/wheelchair/BSC Sit to Stand: Max assist, From elevated surface          Lateral/Scoot Transfers: Max assist General transfer comment: max A to elevate hips from EOB x3 with use of stedy, pt with some aprehension about stedy use and fear of falling initiallyfading with increased trials,  pt with good effort, max A to scoot along EOB toward [facility]    Ambulation/Gait                   Stairs             Wheelchair Mobility     Tilt Bed    Modified Rankin (Stroke Patients Only)       Balance Overall balance assessment: Needs assistance Sitting-balance support: Feet supported, Bilateral upper extremity supported Sitting balance-Leahy Scale: Fair Sitting balance - Comments: supervision static sitting     Standing balance-Leahy Scale: Zero Standing balance comment: unable to acheive full stand with max A+1                            Cognition Arousal: Alert Behavior During Therapy: WFL for tasks assessed/performed Overall Cognitive Status: Impaired/Different from  baseline Area of Impairment: Safety/judgement, Awareness, Problem solving                         Safety/Judgement: Decreased awareness of safety, Decreased awareness of deficits   Problem Solving: Slow processing, Decreased initiation, Difficulty sequencing, Requires verbal cues, Requires tactile cues General Comments: Pt is oriented x 4 but appears to have some short term memory deficits.        Exercises Other  Exercises Other Exercises: continued education pon phantom limb sensation & phantom limb pain, as well as desensitization techniques. pt able to recall no pillow under knee and reasoning, pt educated on pressure relief techniques with pt able to demo back with good technique    General Comments        Pertinent Vitals/Pain Pain Assessment Pain Assessment: Faces Faces Pain Scale: Hurts little more Pain Location: R BKA surgical site, bottom in sitting Pain Descriptors / Indicators: Grimacing, Guarding, Discomfort Pain Intervention(s): Monitored during session, Limited activity within patient's tolerance, Repositioned    Home Living                          Prior Function            PT Goals (current goals can now be found in the care plan section) Acute Rehab PT Goals Patient Stated Goal: decreased pain PT Goal Formulation: With patient Time For Goal Achievement: 01/13/23 Progress towards PT goals: Progressing toward goals    Frequency    Min 1X/week      PT Plan      Co-evaluation              AM-PAC PT "6 Clicks" Mobility   Outcome Measure  Help needed turning from your back to your side while in a flat bed without using bedrails?: A Little Help needed moving from lying on your back to sitting on the side of a flat bed without using bedrails?: A Lot Help needed moving to and from a bed to a chair (including a wheelchair)?: Total Help needed standing up from a chair using your arms (e.g., wheelchair or bedside chair)?: Total Help needed to walk in hospital room?: Total Help needed climbing 3-5 steps with a railing? : Total 6 Click Score: 9    End of Session Equipment Utilized During Treatment: Other (comment) (R limb protector) Activity Tolerance: Patient tolerated treatment well Patient left: in bed;with call bell/phone within reach;with nursing/sitter in room;Other (comment) (seated up EOB) Nurse Communication: Mobility status;Other (comment)  (wound van not plugged into wall on arrival, this PTA plugged in with RN present) PT Visit Diagnosis: Muscle weakness (generalized) (M62.81);Difficulty in walking, not elsewhere classified (R26.2);Pain;Other abnormalities of gait and mobility (R26.89) Pain - Right/Left: Right Pain - part of body: Leg     Time: 1206-1231 PT Time Calculation (min) (ACUTE ONLY): 25 min  Charges:    $Therapeutic Activity: 23-37 mins PT General Charges $$ ACUTE PT VISIT: 1 Visit                     Alissia Lory R. PTA Acute Rehabilitation Services Office: [phone]   Catalina Antigua 01/06/2023, 2:07 PM

## 2023-01-07 DIAGNOSIS — D649 Anemia, unspecified: Secondary | ICD-10-CM | POA: Diagnosis not present

## 2023-01-07 DIAGNOSIS — I5032 Chronic diastolic (congestive) heart failure: Secondary | ICD-10-CM | POA: Diagnosis not present

## 2023-01-07 DIAGNOSIS — K31819 Angiodysplasia of stomach and duodenum without bleeding: Secondary | ICD-10-CM | POA: Diagnosis not present

## 2023-01-07 DIAGNOSIS — M86171 Other acute osteomyelitis, right ankle and foot: Secondary | ICD-10-CM | POA: Diagnosis not present

## 2023-01-07 LAB — CBC WITH DIFFERENTIAL/PLATELET
Abs Immature Granulocytes: 0.36 10*3/uL — ABNORMAL HIGH (ref 0.00–0.07)
Basophils Absolute: 0.1 10*3/uL (ref 0.0–0.1)
Basophils Relative: 1 %
Eosinophils Absolute: 0.4 10*3/uL (ref 0.0–0.5)
Eosinophils Relative: 2 %
HCT: 26.2 % — ABNORMAL LOW (ref 36.0–46.0)
Hemoglobin: 7.9 g/dL — ABNORMAL LOW (ref 12.0–15.0)
Immature Granulocytes: 2 %
Lymphocytes Relative: 18 %
Lymphs Abs: 3.6 10*3/uL (ref 0.7–4.0)
MCH: 28.5 pg (ref 26.0–34.0)
MCHC: 30.2 g/dL (ref 30.0–36.0)
MCV: 94.6 fL (ref 80.0–100.0)
Monocytes Absolute: 1 10*3/uL (ref 0.1–1.0)
Monocytes Relative: 5 %
Neutro Abs: 14.5 10*3/uL — ABNORMAL HIGH (ref 1.7–7.7)
Neutrophils Relative %: 72 %
Platelets: 423 10*3/uL — ABNORMAL HIGH (ref 150–400)
RBC: 2.77 MIL/uL — ABNORMAL LOW (ref 3.87–5.11)
RDW: 15.9 % — ABNORMAL HIGH (ref 11.5–15.5)
WBC: 20 10*3/uL — ABNORMAL HIGH (ref 4.0–10.5)
nRBC: 0 % (ref 0.0–0.2)

## 2023-01-07 LAB — GLUCOSE, CAPILLARY
Glucose-Capillary: 112 mg/dL — ABNORMAL HIGH (ref 70–99)
Glucose-Capillary: 120 mg/dL — ABNORMAL HIGH (ref 70–99)
Glucose-Capillary: 205 mg/dL — ABNORMAL HIGH (ref 70–99)
Glucose-Capillary: 155 mg/dL — ABNORMAL HIGH (ref 70–99)

## 2023-01-07 LAB — RENAL FUNCTION PANEL
Albumin: 2 g/dL — ABNORMAL LOW (ref 3.5–5.0)
Anion gap: 11 (ref 5–15)
BUN: 17 mg/dL (ref 6–20)
CO2: 23 mmol/L (ref 22–32)
Calcium: 8.3 mg/dL — ABNORMAL LOW (ref 8.9–10.3)
Chloride: 99 mmol/L (ref 98–111)
Creatinine, Ser: 4.31 mg/dL — ABNORMAL HIGH (ref 0.44–1.00)
GFR, Estimated: 11 mL/min — ABNORMAL LOW (ref >60)
Glucose, Bld: 129 mg/dL — ABNORMAL HIGH (ref 70–99)
Phosphorus: 3 mg/dL (ref 2.5–4.6)
Potassium: 3.8 mmol/L (ref 3.5–5.1)
Sodium: 133 mmol/L — ABNORMAL LOW (ref 135–145)

## 2023-01-07 NOTE — TOC Progression Note (Addendum)
Transition of Care [facility]) - Progression Note    Patient Details  Name: Deborah [name] MRN: [id number] Date of Birth: 06/25/1965  Transition of Care [facility]) CM/SW Contact  Erin Sons, [place] Phone Number: 01/07/2023, 9:44 AM  Clinical Narrative:     Berkley Harvey still pending at this time.   1200: Auth went to Peer to peer; MD completed Peer to peer and informed CSW that Berkley Harvey was approved. Awaiting auth approval details to be generated in online portal. OPHD clinic transfer still pending at this time. Auth approved 11/1- 11/5. [id number]                Social Determinants of Health (SDOH) Interventions SDOH Screenings   Food Insecurity: No Food Insecurity (12/15/2022)  Housing: Patient Declined (12/15/2022)  Transportation Needs: No Transportation Needs (12/15/2022)  Utilities: Not At Risk (12/15/2022)  Depression (PHQ2-9): High Risk (11/18/2022)  Financial Resource Strain: Low Risk (03/23/2021)   [facility]  Physical Activity: Not on File (10/31/2017)   Received from [place], [place]  Social Connections: Unknown (07/19/2021)   [facility]  Stress: Low Risk (03/23/2021)   [facility]  Tobacco Use: Low Risk  (12/31/2022)    Readmission Risk Interventions    11/25/2022    5:16 PM 07/05/2022    1:20 PM  Readmission Risk Prevention Plan  Transportation Screening Complete Complete  Medication Review (RN Care Manager) Complete Referral to Pharmacy  PCP or Specialist appointment within 3-5 days of discharge Complete Complete  HRI or Home Care Consult Complete Complete  SW Recovery Care/Counseling Consult Complete Complete  Palliative Care Screening Not Applicable Not Applicable  Skilled Nursing Facility Not Applicable Not Applicable

## 2023-01-07 NOTE — Progress Notes (Signed)
  Progress Note   Patient: Deborah [name] GNF:[id number] DOB: 06-Apr-1965 DOA: 12/14/2022     24 DOS: the patient was seen and examined on 01/07/2023       Brief hospital course: 57 y.o. F with ESRD on HD MWF, cAF on Eliquis, dCHF, DM, gastroparesis and low grade neuroendocrine tumor of duodenum, HTN, MO, NASH and chronic right ankle ulcer, recently admitted for ankle pain/swelling, underwent angiogram, ultimately treated for gout, who returned with BRBPR and was found to have Hgb 5.8.    Underwent EGD and SCE that showed Since the time of her admission she has experienced recurrent GI bleeding.  Ultimately she was taken for an EGD and capsule endoscopy that showed AVMs.  Also found to have osteomyelitis of the right ankle and underwent BKA on 10/23     Assessment and Plan: * Symptomatic anemia Gastric AVM No active bleeding - Continue PPI - Stop carafate - Trend Hgb    Acute osteomyelitis of right calcaneus (HCC) Stable.  Denies pain in stump to me emphatically.  States her pain is in her left leg.  Vascular studies of that leg were normal.  The leg appears normal on exam.  She also reports abdominal pain from her chronic gastroparesis, for which she takes dilaudid PO 4mg  TID at home.  Nursing report she is inconsistent in her reported pain symptoms with them.  With me, she is also vague.  Overall, I suspect a degree of opiate tolerance and chronic opiate hypersensitivity.   Morbid obesity (HCC) BMI 43  Tardive dyskinesia New diagnosis - Continue Ingrezza  (HFpEF) heart failure with preserved ejection fraction (HCC) - Volume status per HD  Chronic a-fib (HCC) - Continue metoprolol - Hold anticoagulation  End stage renal disease on dialysis [facility]) - Consult Nephrology for maintenance HD - Continue Aranesp, Hectorol    Insulin dependent type 2 diabetes mellitus (HCC) Glucose normal - Continue glargine - Continue SS corrections  Essential hypertension BP stable -  Continue metoprolol          Subjective: Patient refused HD this morning because nursing hadn't brought her IV dilaudid fast enough.  No fever, no skin changes to left leg.  Has shooting pain from left thigh to foot.  Left heel wound looks fine.     Physical Exam: BP 104/70 (BP Location: Right Arm)   Pulse (!) 105   Temp 98.5 F (36.9 C) (Oral)   Resp 20   Ht 5\' 6"  (1.676 m)   Wt 121.1 kg   LMP 10/10/2012   SpO2 100%   BMI 43.09 kg/m   Obese adult female, sitting up in bed, interactive, tired RRR, no murmurs, no peripheral edema Respiratory rate normal, lungs clear without rales or wheezes Shrinker on right leg, appears stable and intact, no pitting in the left leg Abdomen soft no tenderness palpation Attention diminished, affect blunted, judgment and insight appear at baseline    Data Reviewed: Hemoglobin trending slightly down   Family Communication: None present    Disposition: Status is: Inpatient         Author: Alberteen Sam, MD 01/07/2023 6:56 PM  For on call review [email or web address].

## 2023-01-07 NOTE — Plan of Care (Signed)
  Problem: Education: Goal: Understanding of CV disease, CV risk reduction, and recovery process will improve Outcome: Progressing Goal: Individualized Educational Video(s) Outcome: Progressing   Problem: Activity: Goal: Ability to return to baseline activity level will improve Outcome: Progressing   Problem: Cardiovascular: Goal: Ability to achieve and maintain adequate cardiovascular perfusion will improve Outcome: Progressing Goal: Vascular access site(s) Level 0-1 will be maintained Outcome: Progressing   Problem: Health Behavior/Discharge Planning: Goal: Ability to safely manage health-related needs after discharge will improve Outcome: Progressing   Problem: Education: Goal: Ability to describe self-care measures that may prevent or decrease complications (Diabetes Survival Skills Education) will improve Outcome: Progressing Goal: Individualized Educational Video(s) Outcome: Progressing   Problem: Coping: Goal: Ability to adjust to condition or change in health will improve Outcome: Progressing   Problem: Fluid Volume: Goal: Ability to maintain a balanced intake and output will improve Outcome: Progressing   Problem: Health Behavior/Discharge Planning: Goal: Ability to identify and utilize available resources and services will improve Outcome: Progressing Goal: Ability to manage health-related needs will improve Outcome: Progressing   Problem: Metabolic: Goal: Ability to maintain appropriate glucose levels will improve Outcome: Progressing   Problem: Nutritional: Goal: Progress toward achieving an optimal weight will improve Outcome: Progressing   Problem: Skin Integrity: Goal: Risk for impaired skin integrity will decrease Outcome: Progressing   Problem: Tissue Perfusion: Goal: Adequacy of tissue perfusion will improve Outcome: Progressing   Problem: Education: Goal: Knowledge of General Education information will improve Description: Including pain  rating scale, medication(s)/side effects and non-pharmacologic comfort measures Outcome: Progressing   Problem: Health Behavior/Discharge Planning: Goal: Ability to manage health-related needs will improve Outcome: Progressing   Problem: Clinical Measurements: Goal: Ability to maintain clinical measurements within normal limits will improve Outcome: Progressing Goal: Will remain free from infection Outcome: Progressing Goal: Diagnostic test results will improve Outcome: Progressing Goal: Respiratory complications will improve Outcome: Progressing Goal: Cardiovascular complication will be avoided Outcome: Progressing   Problem: Activity: Goal: Risk for activity intolerance will decrease Outcome: Progressing   Problem: Nutrition: Goal: Adequate nutrition will be maintained Outcome: Progressing   Problem: Coping: Goal: Level of anxiety will decrease Outcome: Progressing   Problem: Elimination: Goal: Will not experience complications related to bowel motility Outcome: Progressing   Problem: Pain Managment: Goal: General experience of comfort will improve Outcome: Progressing   Problem: Safety: Goal: Ability to remain free from injury will improve Outcome: Progressing   Problem: Skin Integrity: Goal: Risk for impaired skin integrity will decrease Outcome: Progressing   Problem: Education: Goal: Knowledge of the prescribed therapeutic regimen will improve Outcome: Progressing Goal: Ability to verbalize activity precautions or restrictions will improve Outcome: Progressing Goal: Understanding of discharge needs will improve Outcome: Progressing   Problem: Activity: Goal: Ability to perform//tolerate increased activity and mobilize with assistive devices will improve Outcome: Progressing   Problem: Clinical Measurements: Goal: Postoperative complications will be avoided or minimized Outcome: Progressing   Problem: Self-Care: Goal: Ability to meet self-care  needs will improve Outcome: Progressing   Problem: Self-Concept: Goal: Ability to maintain and perform role responsibilities to the fullest extent possible will improve Outcome: Progressing   Problem: Pain Management: Goal: Pain level will decrease with appropriate interventions Outcome: Progressing   Problem: Skin Integrity: Goal: Demonstration of wound healing without infection will improve Outcome: Progressing

## 2023-01-07 NOTE — Procedures (Signed)
HD note:  Patient refused to come to dialysis.  Spoke with patient's nurse, Osborn Coho, RN. Nurse stated she had spoken with the resident to educate concerning the importance of dialysis.  Patient still refused.

## 2023-01-07 NOTE — Progress Notes (Signed)
Patient refusing dialysis session stating she is in too much pain to go. Patient refusing PO pain meds stating that they dont work. Unable to give IV dilaudid at this time as patient lost IV access. Awaiting IV team to place new IV. Explained to patient the importance of going to dialysis despite being in pain. Patient continued to refuse.

## 2023-01-07 NOTE — Progress Notes (Signed)
[facility] Progress Note   Subjective: Refused HD today but agrees to go tomorrow. Says Pain In L foot now.   Objective Vitals:   01/06/23 2052 01/07/23 0500 01/07/23 0636 01/07/23 0752  BP: (!) 101/47  (!) 115/54 91/70  Pulse: 84  91 93  Resp:   18 19  Temp:   98.7 F (37.1 C) 97.9 F (36.6 C)  TempSrc:      SpO2:   100% 100%  Weight:  121.1 kg    Height:       Physical Exam General: Chronically ill appearing female in NAD Heart: S1, S2 No M/R/G.  Lungs: CTAB  Abdomen: Obese, NABS Extremities: R BKA in stump protector, LLE trace LE edema. Drsg on heel.  Dialysis Access: L AVF healing, RIJ TDC drsg intact   Additional Objective Labs: Basic Metabolic Panel: Recent Labs  Lab 01/03/23 1521 01/05/23 0705 01/06/23 0431 01/07/23 0410  NA 134* 133* 133* 133*  K 3.2* 3.2* 3.5 3.8  CL 100 100 97* 99  CO2 24 25 28 23   GLUCOSE 116* 121* 118* 129*  BUN 36* 20 9 17   CREATININE 4.72* 4.26* 2.94* 4.31*  CALCIUM 7.8* 8.2* 8.3* 8.3*  PHOS 3.9 2.7  --  3.0   Liver Function Tests: Recent Labs  Lab 01/03/23 1521 01/05/23 0705 01/07/23 0410  ALBUMIN 1.8* 1.9* 2.0*   No results for input(s): "LIPASE", "AMYLASE" in the last 168 hours. CBC: Recent Labs  Lab 01/02/23 0443 01/03/23 0425 01/05/23 0705 01/06/23 0431 01/07/23 0410  WBC 18.8* 14.9* 13.5* 22.0* 20.0*  NEUTROABS  --   --  9.2*  --  14.5*  HGB 7.8* 8.3* 7.0* 8.4* 7.9*  HCT 25.8* 27.3* 23.1* 27.9* 26.2*  MCV 95.9 96.5 96.7 94.9 94.6  PLT 413* 407* 384 392 423*   Blood Culture    Component Value Date/Time   SDES BLOOD SITE NOT SPECIFIED 11/23/2022 0023   SPECREQUEST  11/23/2022 0023    BOTTLES DRAWN AEROBIC AND ANAEROBIC Blood Culture adequate volume   CULT  11/23/2022 0023    NO GROWTH 5 DAYS Performed at [facility], [address], [place], [place] [address]    REPTSTATUS 11/28/2022 FINAL 11/23/2022 0023    Cardiac Enzymes: No results for input(s): "CKTOTAL", "CKMB",  "CKMBINDEX", "TROPONINI" in the last 168 hours. CBG: Recent Labs  Lab 01/06/23 0729 01/06/23 1110 01/06/23 1644 01/06/23 2025 01/07/23 0745  GLUCAP 107* 143* 161* 155* 112*   Iron Studies:  Recent Labs    01/06/23 0431  IRON 54  TIBC 160*   @lablastinr3 @ Studies/Results: No results found. Medications:  anticoagulant sodium citrate      (feeding supplement) PROSource Plus  30 mL Oral BID BM   sodium chloride   Intravenous Once   allopurinol  100 mg Oral BID   vitamin C  1,000 mg Oral Daily   atorvastatin  10 mg Oral QHS   darbepoetin (ARANESP) injection - DIALYSIS  200 mcg Subcutaneous Q Fri-1800   docusate sodium  100 mg Oral Daily   doxercalciferol  7 mcg Intravenous Q M,W,F-HD   feeding supplement (NEPRO CARB STEADY)  237 mL Oral BID BM   folic acid  1 mg Oral Daily   Gerhardt's butt cream   Topical BID   insulin aspart  0-6 Units Subcutaneous TID WC   insulin aspart  2 Units Subcutaneous TID WC   insulin glargine-yfgn  8 Units Subcutaneous QHS   LORazepam  1 mg Oral Once  metoprolol tartrate  50 mg Oral BID   nutrition supplement (JUVEN)  1 packet Oral BID BM   pantoprazole  40 mg Oral Daily   sucralfate  1 g Oral TID WC & HS   valbenazine  40 mg Oral Daily   zinc sulfate  220 mg Oral Daily     Dialysis Orders: [facility]WF [facility]  4hr, 180dialyzer, 400/800, EDW 126.9kg, 3K/2.5Ca bath, TDC in R chest, 1st stage LUE BVT 06/2022 - mircera 225 mcg last 10/5 - hectorol 7 mcg   Assessment/Plan: ESRD: Continue HD on MWF schedule - next HD 11/2 in the recliner. Refused HD 01/08/2023 Hypokalemia: Using 3K bath with HD. K+ now 3.5. May need to add supplementation. Right foot osteomyelitis/abscess s/p R BKA on 12/29/22: Per ortho.  BP/volume: BP stable, edema improving and now under EDW. Will have lower EDW on d/c s/p amputation. Anemia (ABLA/GIB + ESRD): S/p EGD Multiple gastric AVMs, treated with APC and capsule endo 10/17 showing polyps only. Colonoscopy recommended.  Getting Aranesp q Friday here. S/p 1 unit PRBCs with HD 10/30, Hgb now is 7.9. Secondary Hyperparathyroidism/Hyperphosphatemia: CorrCa okay but phos dropped, will hold binders for now. Monitor trend. Vascular access: TDC in place, s/p AVF revision/transposition 10/25. AMS/acute toxic encephalopathy (resolved): Med induced, likely. MRI without acute findings.  T2DM: Insulin per primary. Nutrition: Alb very low, continue supplements. Dispo: Discussed with Renal Navigator who informed me her outpatient HD center may be changed to either [facility] or [facility] d/t recent acceptance to [facility]. Will order for patient to be placed in recliner on HD days moving forward to ensure she can tolerate being in the chair for HD.    Kooper Chriswell H. Jasiyah Poland NP-C 01/07/2023, 11:08 AM  [employer] [phone]

## 2023-01-07 NOTE — Progress Notes (Signed)
Contacted [facility] admissions to request an update on pt's transfer request. Will await a response. Plan is to have pt receive HD in chair prior to d/c to make sure pt can tolerate. Will assist as needed.   Olivia Canter Renal Navigator [phone]

## 2023-01-08 DIAGNOSIS — D649 Anemia, unspecified: Secondary | ICD-10-CM | POA: Diagnosis not present

## 2023-01-08 DIAGNOSIS — I5032 Chronic diastolic (congestive) heart failure: Secondary | ICD-10-CM | POA: Diagnosis not present

## 2023-01-08 DIAGNOSIS — K31819 Angiodysplasia of stomach and duodenum without bleeding: Secondary | ICD-10-CM | POA: Diagnosis not present

## 2023-01-08 DIAGNOSIS — M86171 Other acute osteomyelitis, right ankle and foot: Secondary | ICD-10-CM | POA: Diagnosis not present

## 2023-01-08 LAB — RENAL FUNCTION PANEL
Albumin: 2.1 g/dL — ABNORMAL LOW (ref 3.5–5.0)
Anion gap: 11 (ref 5–15)
BUN: 22 mg/dL — ABNORMAL HIGH (ref 6–20)
CO2: 23 mmol/L (ref 22–32)
Calcium: 8.8 mg/dL — ABNORMAL LOW (ref 8.9–10.3)
Chloride: 99 mmol/L (ref 98–111)
Creatinine, Ser: 5.25 mg/dL — ABNORMAL HIGH (ref 0.44–1.00)
GFR, Estimated: 9 mL/min — ABNORMAL LOW (ref >60)
Glucose, Bld: 152 mg/dL — ABNORMAL HIGH (ref 70–99)
Phosphorus: 3.9 mg/dL (ref 2.5–4.6)
Potassium: 3.3 mmol/L — ABNORMAL LOW (ref 3.5–5.1)
Sodium: 133 mmol/L — ABNORMAL LOW (ref 135–145)

## 2023-01-08 LAB — GLUCOSE, CAPILLARY
Glucose-Capillary: 139 mg/dL — ABNORMAL HIGH (ref 70–99)
Glucose-Capillary: 107 mg/dL — ABNORMAL HIGH (ref 70–99)
Glucose-Capillary: 182 mg/dL — ABNORMAL HIGH (ref 70–99)
Glucose-Capillary: 152 mg/dL — ABNORMAL HIGH (ref 70–99)

## 2023-01-08 LAB — CBC
HCT: 26.9 % — ABNORMAL LOW (ref 36.0–46.0)
Hemoglobin: 8.2 g/dL — ABNORMAL LOW (ref 12.0–15.0)
MCH: 29 pg (ref 26.0–34.0)
MCHC: 30.5 g/dL (ref 30.0–36.0)
MCV: 95.1 fL (ref 80.0–100.0)
Platelets: 481 10*3/uL — ABNORMAL HIGH (ref 150–400)
RBC: 2.83 MIL/uL — ABNORMAL LOW (ref 3.87–5.11)
RDW: 15.8 % — ABNORMAL HIGH (ref 11.5–15.5)
WBC: 17.1 10*3/uL — ABNORMAL HIGH (ref 4.0–10.5)
nRBC: 0 % (ref 0.0–0.2)

## 2023-01-08 MED ORDER — HYDROMORPHONE HCL 1 MG/ML IJ SOLN
1.0000 mg | INTRAMUSCULAR | Status: DC | PRN
Start: 2023-01-08 — End: 2023-01-09
  Administered 2023-01-08: 1 mg via INTRAVENOUS
  Administered 2023-01-08: 2 mg via INTRAVENOUS
  Administered 2023-01-08: 1 mg via INTRAVENOUS
  Administered 2023-01-09: 2 mg via INTRAVENOUS
  Administered 2023-01-09: 1 mg via INTRAVENOUS
  Administered 2023-01-09: 2 mg via INTRAVENOUS
  Filled 2023-01-08: qty 2
  Filled 2023-01-08 (×2): qty 1
  Filled 2023-01-08 (×2): qty 2
  Filled 2023-01-08 (×2): qty 1

## 2023-01-08 MED ORDER — HYDROMORPHONE HCL 1 MG/ML IJ SOLN: 0.5000 mg | Freq: Once | INTRAMUSCULAR | Status: DC

## 2023-01-08 MED ORDER — HEPARIN SODIUM (PORCINE) 1000 UNIT/ML IJ SOLN
INTRAMUSCULAR | Status: AC
  Administered 2023-01-08: 1000 [IU]
  Filled 2023-01-08: qty 4

## 2023-01-08 NOTE — Progress Notes (Signed)
Pt is c/o pain at 10/10 and is refusing to take po Dilaudid and is only wanting IV Dilaudid. Pt states that the pill does not help the pain at all. IV order states to only give if unrelieved by po Dilaudid. RN messaged MD on call to make aware that pt is not taking po Dilaudid and only wanting the IV order.   Devonne Doughty Darrel Baroni

## 2023-01-08 NOTE — Progress Notes (Signed)
  Progress Note   Patient: Deborah [name]:[id number] DOB: 03-Jun-1965 DOA: 12/14/2022     25 DOS: the patient was seen and examined on 01/08/2023 at 9:51AM      Brief hospital course: 57 y.o. F with ESRD on HD MWF, cAF on Eliquis, dCHF, DM, gastroparesis and low grade neuroendocrine tumor of duodenum, HTN, MO, NASH and chronic right ankle ulcer, recently admitted for ankle pain/swelling, underwent angiogram, ultimately treated for gout, who returned with BRBPR and was found to have Hgb 5.8.    Underwent EGD and SCE that showed Since the time of her admission she has experienced recurrent GI bleeding.  Ultimately she was taken for an EGD and capsule endoscopy that showed AVMs.  Also found to have osteomyelitis of the right ankle and underwent BKA on 10/23     Assessment and Plan: * Symptomatic anemia Gastric AVM No clinical bleeding or bleeding, hemoglobin stable at 8.2 -Continue PPI    Acute osteomyelitis of right calcaneus [facility]) S/p right BKA on 10/23 by Dr. Lajoyce Corners - Outpatient orthopedics follow up  Chronic pain syndrome Patient has frequent complaints of pain, she is resistant all times to use anything but IV opiates.  She has no objective findings to support increased pain, and her pain complaints tend to migrate right leg stump, left leg, abdomen. - Resume home opiate regimen at discharge  Tardive dyskinesia New diagnosis, seems better - Continue valbenazine  (HFpEF) heart failure with preserved ejection fraction (HCC) Appears euvolemic  Chronic a-fib (HCC) -Hold anticoagulation - Continue metoprolol  End stage renal disease on dialysis [facility]) - Consult Nephrology for maintenance HD - Continue Aranesp, Hectorol  Insulin dependent type 2 diabetes mellitus (HCC) Glucose normal - Continue glargine - Continue SS corrections  Essential hypertension BP stable - Continue metoprolol          Subjective: Patient sitting up on dialysis chair, looking at  [facility] on her phone, no acute distress, she has no complaints.  No pain complaints today.     Physical Exam: BP (!) 126/110   Pulse 87   Temp 97.8 F (36.6 C)   Resp 19   Ht 5\' 6"  (1.676 m)   Wt 121.2 kg   LMP 10/10/2012   SpO2 100%   BMI 43.13 kg/m   On dialysis, sitting up in recliner RRR, no murmurs, no peripheral edema Respiratory normal, lungs clear without rales or wheezes Attention normal, affect blunted, judgment and insight appear at baseline   Data Reviewed: CBC shows stable hemoglobin Basic metabolic panel unremarkable in setting of dialysis  Family Communication:     Disposition: Status is: Inpatient The patient was admitted with rectal bleeding, symptomatic anemia  She subsequently was found to have osteomyelitis and underwent a right BKA.  In the setting of her obesity, chronic pain, new BKA, she will need daily rehabilitation return to her prior independent level of function.  She has been working well with physical therapy, and we anticipate discharge soon as dialysis bed is available        Author: Alberteen Sam, MD 01/08/2023 11:31 AM  For on call review [email or web address].

## 2023-01-08 NOTE — Progress Notes (Signed)
[facility] Progress Note   Subjective: Up in chair, tolerating well. Usual complaints of pain.   Objective Vitals:   01/08/23 0747 01/08/23 0800 01/08/23 0830 01/08/23 0900  BP: 136/64 136/71 110/63 (!) 105/93  Pulse: 84 78 81 83  Resp: 17 17 19  (!) 21  Temp: 97.8 F (36.6 C)     TempSrc:      SpO2: 100% 100% 100% 100%  Weight:      Height:       Physical Exam General: Chronically ill appearing female in NAD Heart: S1, S2 No M/R/G.  Lungs: CTAB  Abdomen: Obese, NABS Extremities: R BKA in stump protector, LLE trace LE edema. Drsg on heel.  Dialysis Access: L AVF healing, RIJ TDC drsg intact  Additional Objective Labs: Basic Metabolic Panel: Recent Labs  Lab 01/03/23 1521 01/05/23 0705 01/06/23 0431 01/07/23 0410  NA 134* 133* 133* 133*  K 3.2* 3.2* 3.5 3.8  CL 100 100 97* 99  CO2 24 25 28 23   GLUCOSE 116* 121* 118* 129*  BUN 36* 20 9 17   CREATININE 4.72* 4.26* 2.94* 4.31*  CALCIUM 7.8* 8.2* 8.3* 8.3*  PHOS 3.9 2.7  --  3.0   Liver Function Tests: Recent Labs  Lab 01/03/23 1521 01/05/23 0705 01/07/23 0410  ALBUMIN 1.8* 1.9* 2.0*   No results for input(s): "LIPASE", "AMYLASE" in the last 168 hours. CBC: Recent Labs  Lab 01/03/23 0425 01/05/23 0705 01/06/23 0431 01/07/23 0410 01/08/23 0830  WBC 14.9* 13.5* 22.0* 20.0* 17.1*  NEUTROABS  --  9.2*  --  14.5*  --   HGB 8.3* 7.0* 8.4* 7.9* 8.2*  HCT 27.3* 23.1* 27.9* 26.2* 26.9*  MCV 96.5 96.7 94.9 94.6 95.1  PLT 407* 384 392 423* 481*   Blood Culture    Component Value Date/Time   SDES BLOOD SITE NOT SPECIFIED 11/23/2022 0023   SPECREQUEST  11/23/2022 0023    BOTTLES DRAWN AEROBIC AND ANAEROBIC Blood Culture adequate volume   CULT  11/23/2022 0023    NO GROWTH 5 DAYS Performed at [facility], [address], [place], [place] [address]    REPTSTATUS 11/28/2022 FINAL 11/23/2022 0023    Cardiac Enzymes: No results for input(s): "CKTOTAL", "CKMB", "CKMBINDEX", "TROPONINI"  in the last 168 hours. CBG: Recent Labs  Lab 01/07/23 0745 01/07/23 1118 01/07/23 1605 01/07/23 2111 01/08/23 0724  GLUCAP 112* 120* 205* 155* 139*   Iron Studies:  Recent Labs    01/06/23 0431  IRON 54  TIBC 160*   @lablastinr3 @ Studies/Results: No results found. Medications:  anticoagulant sodium citrate      (feeding supplement) PROSource Plus  30 mL Oral BID BM   sodium chloride   Intravenous Once   allopurinol  100 mg Oral BID   vitamin C  1,000 mg Oral Daily   atorvastatin  10 mg Oral QHS   darbepoetin (ARANESP) injection - DIALYSIS  200 mcg Subcutaneous Q Fri-1800   docusate sodium  100 mg Oral Daily   doxercalciferol  7 mcg Intravenous Q M,W,F-HD   feeding supplement (NEPRO CARB STEADY)  237 mL Oral BID BM   folic acid  1 mg Oral Daily   Gerhardt's butt cream   Topical BID   insulin aspart  0-6 Units Subcutaneous TID WC   insulin aspart  2 Units Subcutaneous TID WC   insulin glargine-yfgn  8 Units Subcutaneous QHS   LORazepam  1 mg Oral Once   metoprolol tartrate  50 mg Oral BID  nutrition supplement (JUVEN)  1 packet Oral BID BM   pantoprazole  40 mg Oral Daily   valbenazine  40 mg Oral Daily   zinc sulfate  220 mg Oral Daily     Dialysis Orders: [facility]  4hr, 180dialyzer, 400/800, EDW 126.9kg, 3K/2.5Ca bath, TDC in R chest, 1st stage LUE BVT 06/2022 - mircera 225 mcg last 10/5 - hectorol 7 mcg   Assessment/Plan: ESRD: Continue HD on MWF schedule - next HD 11/2 in the recliner.  Hypokalemia: Using 3K bath with HD. K+ now 3.5. May need to add supplementation. Right foot osteomyelitis/abscess s/p R BKA on 12/29/22: Per ortho.  BP/volume: BP stable, edema improving and now under EDW. Will have lower EDW on d/c s/p amputation. Anemia (ABLA/GIB + ESRD): S/p EGD Multiple gastric AVMs, treated with APC and capsule endo 10/17 showing polyps only. Colonoscopy recommended. Getting Aranesp q Friday here. S/p 1 unit PRBCs with HD 10/30, Hgb now is  8.2. Secondary Hyperparathyroidism/Hyperphosphatemia: CorrCa okay but phos dropped, will hold binders for now. Monitor trend. Vascular access: TDC in place, s/p AVF revision/transposition 10/25. AMS/acute toxic encephalopathy (resolved): Med induced, likely. MRI without acute findings.  T2DM: Insulin per primary. Nutrition: Alb very low, continue supplements. Dispo: Discussed with Renal Navigator who informed me her outpatient HD center may be changed to [facility] or [facility] d/t recent acceptance to [facility]. Will order for patient to be placed in recliner on HD days moving forward to ensure she can tolerate being in the chair for HD.  Genieve Ramaswamy H. Keiko Myricks NP-C 01/08/2023, 9:22 AM  [employer] [phone]

## 2023-01-08 NOTE — Progress Notes (Signed)
   01/08/23 1137  Vitals  Temp 98.1 F (36.7 C)  Pulse Rate 92  Resp (!) 24  BP (!) 109/58  SpO2 100 %  O2 Device Room Air  Oxygen Therapy  Patient Activity (if Appropriate) In chair  Post Treatment  Dialyzer Clearance Heavily streaked  Hemodialysis Intake (mL) 0 mL  Liters Processed 81.9  Fluid Removed (mL) 1000 mL  Tolerated HD Treatment Yes   Received patient in bed to unit.  Alert and oriented.  Informed consent signed and in chart.   TX duration:3.5hrs  Patient tolerated well.  Transported back to the room  Alert, without acute distress.  Hand-off given to patient's nurse.   Access used: [facility] Access issues: none  Total UF removed: 1L Medication(s) given: pain med, hecterol   [name]

## 2023-01-09 DIAGNOSIS — D649 Anemia, unspecified: Secondary | ICD-10-CM | POA: Diagnosis not present

## 2023-01-09 DIAGNOSIS — M86171 Other acute osteomyelitis, right ankle and foot: Secondary | ICD-10-CM | POA: Diagnosis not present

## 2023-01-09 DIAGNOSIS — I5032 Chronic diastolic (congestive) heart failure: Secondary | ICD-10-CM | POA: Diagnosis not present

## 2023-01-09 DIAGNOSIS — K31819 Angiodysplasia of stomach and duodenum without bleeding: Secondary | ICD-10-CM | POA: Diagnosis not present

## 2023-01-09 LAB — BPAM RBC
Blood Product Expiration Date: 202411192359
Blood Product Expiration Date: 202411192359
ISSUE DATE / TIME: 202410301759
Unit Type and Rh: 5100
Unit Type and Rh: 5100

## 2023-01-09 LAB — GLUCOSE, CAPILLARY
Glucose-Capillary: 163 mg/dL — ABNORMAL HIGH (ref 70–99)
Glucose-Capillary: 228 mg/dL — ABNORMAL HIGH (ref 70–99)
Glucose-Capillary: 236 mg/dL — ABNORMAL HIGH (ref 70–99)
Glucose-Capillary: 201 mg/dL — ABNORMAL HIGH (ref 70–99)

## 2023-01-09 LAB — TYPE AND SCREEN
ABO/RH(D): O POS
Antibody Screen: NEGATIVE
Donor AG Type: NEGATIVE
Donor AG Type: NEGATIVE
Unit division: 0
Unit division: 0

## 2023-01-09 MED ORDER — HYDROMORPHONE HCL 1 MG/ML IJ SOLN
1.0000 mg | Freq: Once | INTRAMUSCULAR | Status: AC
  Administered 2023-01-09: 1 mg via INTRAMUSCULAR
  Filled 2023-01-09: qty 1

## 2023-01-09 MED ORDER — SALINE SPRAY 0.65 % NA SOLN
1.0000 | NASAL | Status: DC | PRN
  Administered 2023-01-10: 1 via NASAL
  Filled 2023-01-09: qty 44

## 2023-01-09 NOTE — Progress Notes (Addendum)
[facility] Progress Note   Subjective: Sitting up in bed naked! HD tomorrow and hopefully discharge post HD. Refusing PO pain meds requesting IV Pain meds.    Objective Vitals:   01/08/23 1705 01/08/23 2124 01/09/23 0617 01/09/23 0826  BP: (!) 75/61 (!) 107/40 135/71 (!) 125/92  Pulse: 86 92 89 76  Resp: 18     Temp: 98.5 F (36.9 C) (!) 97.4 F (36.3 C) 97.9 F (36.6 C) 98 F (36.7 C)  TempSrc: Oral Oral Oral Oral  SpO2: 100% 96% 100% 100%  Weight:      Height:       Physical Exam General: Chronically ill appearing female in NAD Heart: S1, S2 No M/R/G.  Lungs: CTAB  Abdomen: Obese, NABS Extremities: R BKA in stump protector, LLE trace LE edema. Drsg on heel.  Dialysis Access: L AVF healing, RIJ TDC drsg intact  Additional Objective Labs: Basic Metabolic Panel: Recent Labs  Lab 01/05/23 0705 01/06/23 0431 01/07/23 0410 01/08/23 0830  NA 133* 133* 133* 133*  K 3.2* 3.5 3.8 3.3*  CL 100 97* 99 99  CO2 25 28 23 23   GLUCOSE 121* 118* 129* 152*  BUN 20 9 17  22*  CREATININE 4.26* 2.94* 4.31* 5.25*  CALCIUM 8.2* 8.3* 8.3* 8.8*  PHOS 2.7  --  3.0 3.9   Liver Function Tests: Recent Labs  Lab 01/05/23 0705 01/07/23 0410 01/08/23 0830  ALBUMIN 1.9* 2.0* 2.1*   No results for input(s): "LIPASE", "AMYLASE" in the last 168 hours. CBC: Recent Labs  Lab 01/03/23 0425 01/05/23 0705 01/06/23 0431 01/07/23 0410 01/08/23 0830  WBC 14.9* 13.5* 22.0* 20.0* 17.1*  NEUTROABS  --  9.2*  --  14.5*  --   HGB 8.3* 7.0* 8.4* 7.9* 8.2*  HCT 27.3* 23.1* 27.9* 26.2* 26.9*  MCV 96.5 96.7 94.9 94.6 95.1  PLT 407* 384 392 423* 481*   Blood Culture    Component Value Date/Time   SDES BLOOD SITE NOT SPECIFIED 11/23/2022 0023   SPECREQUEST  11/23/2022 0023    BOTTLES DRAWN AEROBIC AND ANAEROBIC Blood Culture adequate volume   CULT  11/23/2022 0023    NO GROWTH 5 DAYS Performed at [facility], [address], [place], [place] [address]    REPTSTATUS  11/28/2022 FINAL 11/23/2022 0023    Cardiac Enzymes: No results for input(s): "CKTOTAL", "CKMB", "CKMBINDEX", "TROPONINI" in the last 168 hours. CBG: Recent Labs  Lab 01/08/23 0724 01/08/23 1200 01/08/23 1601 01/08/23 2149 01/09/23 0737  GLUCAP 139* 107* 182* 152* 163*   Iron Studies: No results for input(s): "IRON", "TIBC", "TRANSFERRIN", "FERRITIN" in the last 72 hours. @lablastinr3 @ Studies/Results: No results found. Medications:  anticoagulant sodium citrate      (feeding supplement) PROSource Plus  30 mL Oral BID BM   sodium chloride   Intravenous Once   allopurinol  100 mg Oral BID   vitamin C  1,000 mg Oral Daily   atorvastatin  10 mg Oral QHS   darbepoetin (ARANESP) injection - DIALYSIS  200 mcg Subcutaneous Q Fri-1800   docusate sodium  100 mg Oral Daily   doxercalciferol  7 mcg Intravenous Q M,W,F-HD   feeding supplement (NEPRO CARB STEADY)  237 mL Oral BID BM   folic acid  1 mg Oral Daily   Gerhardt's butt cream   Topical BID    HYDROmorphone (DILAUDID) injection  0.5 mg Intravenous Once   insulin aspart  0-6 Units Subcutaneous TID WC   insulin aspart  2  Units Subcutaneous TID WC   insulin glargine-yfgn  8 Units Subcutaneous QHS   LORazepam  1 mg Oral Once   metoprolol tartrate  50 mg Oral BID   nutrition supplement (JUVEN)  1 packet Oral BID BM   pantoprazole  40 mg Oral Daily   valbenazine  40 mg Oral Daily   zinc sulfate  220 mg Oral Daily     Dialysis Orders: [facility]WF [facility]  4hr, 180dialyzer, 400/800, EDW 126.9kg, 3K/2.5Ca bath, TDC in R chest, 1st stage LUE BVT 06/2022 - Heparin 5000 units IV initial bolus Heparin 2500 units IV mid run - mircera 225 mcg last 10/5 - hectorol 7 mcg   Assessment/Plan: ESRD: Continue HD on MWF schedule - next HD 11/4 in the recliner.  Hypokalemia: Using 3K bath with HD. K+ now 3.5. May need to add supplementation. Right foot osteomyelitis/abscess s/p R BKA on 12/29/22: Per ortho.  BP/volume: BP stable, edema improving  and now under EDW. Will have lower EDW on d/c s/p amputation. Anemia (ABLA/GIB + ESRD): S/p EGD Multiple gastric AVMs, treated with APC and capsule endo 10/17 showing polyps only. Colonoscopy recommended. Getting Aranesp q Friday here. S/p 1 unit PRBCs with HD 10/30, Hgb now is 8.2. Secondary Hyperparathyroidism/Hyperphosphatemia: CorrCa okay but phos dropped, will hold binders for now. Monitor trend. Vascular access: TDC in place, s/p AVF revision/transposition 10/25. AMS/acute toxic encephalopathy (resolved): Med induced, likely. MRI without acute findings.  T2DM: Insulin per primary. Nutrition: Alb very low, continue supplements. Dispo: Discussed with Renal Navigator who informed me her outpatient HD center may be changed to either [facility] or [facility] d/t recent acceptance to [facility]. Patient is able to have HD in recliner as evidenced 01/07/2023. She is stable for discharge.   Dorice Stiggers H. Maurisa Tesmer NP-C 01/09/2023, 11:07 AM  [employer] [phone]

## 2023-01-09 NOTE — Progress Notes (Signed)
  Progress Note   Patient: Deborah [name] ZHY:[id number] DOB: Sep 20, 1965 DOA: 12/14/2022     26 DOS: the patient was seen and examined on 01/09/2023        Brief hospital course: 57 y.o. F with ESRD on HD MWF, cAF on Eliquis, dCHF, DM, gastroparesis and low grade neuroendocrine tumor of duodenum, HTN, MO, NASH and chronic right ankle ulcer, recently admitted for rectal bleeding and ankle osteomyelitis.  Now s/p BKA and stable for discharge to SNF.     Assessment and Plan: * Symptomatic anemia Gastric AVM Hgb stable -Continue PPI    Acute osteomyelitis of right calcaneus (HCC) S/p right BKA on 10/23 by Dr. Lajoyce Corners - Outpatient orthopedics follow up  Chronic pain syndrome - Continue analgesics, plan for home analgesic regimen at discharge  Tardive dyskinesia - Continue Ingrezza  Chronic a-fib (HCC) - Hold anticoagulation - Continue metoprolol  End stage renal disease on dialysis [facility]) - Consult Nephrology for maintenance HD - Continue Aranesp, Hectorol  Insulin dependent type 2 diabetes mellitus (HCC) Glucose normal - Continue glargine - Continue SS corrections  Essential hypertension BP stable - Continue metoprolol          Subjective: No new complaints, states she is ready to go home.  Nursing report diarrhea due to overeating.  No pain complaints to me today.     Physical Exam: BP (!) 125/92 (BP Location: Right Arm)   Pulse 76   Temp 98 F (36.7 C) (Oral)   Resp 18   Ht 5\' 6"  (1.676 m)   Wt 121.2 kg   LMP 10/10/2012   SpO2 100%   BMI 43.13 kg/m   Laying flat across the bed. RRR, no murmurs, no peripheral edema Respiratory normal, lungs clear without rales or wheezes Attention normal, affect blunted, judgment and insight appear at baseline   Data Reviewed:    Family Communication:     Disposition: Status is: Inpatient The patient was admitted with rectal bleeding, symptomatic anemia  She subsequently was found to have  osteomyelitis and underwent a right BKA.  In the setting of her obesity, chronic pain, new BKA, she will need daily rehabilitation return to her prior independent level of function.  She has been working well with physical therapy, and we anticipate discharge soon as dialysis bed is available     Author: Alberteen Sam, MD 01/09/2023 12:56 PM  For on call review [email or web address].

## 2023-01-09 NOTE — Plan of Care (Signed)
  Problem: Education: Goal: Ability to describe self-care measures that may prevent or decrease complications (Diabetes Survival Skills Education) will improve Outcome: Progressing Goal: Individualized Educational Video(s) Outcome: Progressing   Problem: Coping: Goal: Ability to adjust to condition or change in health will improve Outcome: Progressing   Problem: Fluid Volume: Goal: Ability to maintain a balanced intake and output will improve Outcome: Progressing   Problem: Health Behavior/Discharge Planning: Goal: Ability to identify and utilize available resources and services will improve Outcome: Progressing Goal: Ability to manage health-related needs will improve Outcome: Progressing   Problem: Metabolic: Goal: Ability to maintain appropriate glucose levels will improve Outcome: Progressing   Problem: Nutritional: Goal: Progress toward achieving an optimal weight will improve Outcome: Progressing   Problem: Skin Integrity: Goal: Risk for impaired skin integrity will decrease Outcome: Progressing   Problem: Tissue Perfusion: Goal: Adequacy of tissue perfusion will improve Outcome: Progressing   Problem: Education: Goal: Knowledge of General Education information will improve Description: Including pain rating scale, medication(s)/side effects and non-pharmacologic comfort measures Outcome: Progressing   Problem: Health Behavior/Discharge Planning: Goal: Ability to manage health-related needs will improve Outcome: Progressing   Problem: Clinical Measurements: Goal: Ability to maintain clinical measurements within normal limits will improve Outcome: Progressing Goal: Will remain free from infection Outcome: Progressing Goal: Diagnostic test results will improve Outcome: Progressing Goal: Respiratory complications will improve Outcome: Progressing Goal: Cardiovascular complication will be avoided Outcome: Progressing   Problem: Activity: Goal: Risk for  activity intolerance will decrease Outcome: Progressing   Problem: Nutrition: Goal: Adequate nutrition will be maintained Outcome: Progressing   Problem: Coping: Goal: Level of anxiety will decrease Outcome: Progressing   Problem: Elimination: Goal: Will not experience complications related to bowel motility Outcome: Progressing   Problem: Pain Managment: Goal: General experience of comfort will improve Outcome: Progressing   Problem: Safety: Goal: Ability to remain free from injury will improve Outcome: Progressing   Problem: Skin Integrity: Goal: Risk for impaired skin integrity will decrease Outcome: Progressing   Problem: Education: Goal: Knowledge of the prescribed therapeutic regimen will improve Outcome: Progressing Goal: Understanding of discharge needs will improve Outcome: Progressing   Problem: Activity: Goal: Ability to perform//tolerate increased activity and mobilize with assistive devices will improve Outcome: Progressing   Problem: Clinical Measurements: Goal: Postoperative complications will be avoided or minimized Outcome: Progressing   Problem: Self-Care: Goal: Ability to meet self-care needs will improve Outcome: Progressing   Problem: Self-Concept: Goal: Ability to maintain and perform role responsibilities to the fullest extent possible will improve Outcome: Progressing   Problem: Pain Management: Goal: Pain level will decrease with appropriate interventions Outcome: Progressing   Problem: Skin Integrity: Goal: Demonstration of wound healing without infection will improve Outcome: Progressing

## 2023-01-10 DIAGNOSIS — D649 Anemia, unspecified: Secondary | ICD-10-CM | POA: Diagnosis not present

## 2023-01-10 LAB — RENAL FUNCTION PANEL
Albumin: 1.9 g/dL — ABNORMAL LOW (ref 3.5–5.0)
Anion gap: 13 (ref 5–15)
BUN: 22 mg/dL — ABNORMAL HIGH (ref 6–20)
CO2: 23 mmol/L (ref 22–32)
Calcium: 8.3 mg/dL — ABNORMAL LOW (ref 8.9–10.3)
Chloride: 99 mmol/L (ref 98–111)
Creatinine, Ser: 5.07 mg/dL — ABNORMAL HIGH (ref 0.44–1.00)
GFR, Estimated: 9 mL/min — ABNORMAL LOW (ref >60)
Glucose, Bld: 214 mg/dL — ABNORMAL HIGH (ref 70–99)
Phosphorus: 5.3 mg/dL — ABNORMAL HIGH (ref 2.5–4.6)
Potassium: 3.2 mmol/L — ABNORMAL LOW (ref 3.5–5.1)
Sodium: 135 mmol/L (ref 135–145)

## 2023-01-10 LAB — CBC
HCT: 25.9 % — ABNORMAL LOW (ref 36.0–46.0)
Hemoglobin: 7.8 g/dL — ABNORMAL LOW (ref 12.0–15.0)
MCH: 28.9 pg (ref 26.0–34.0)
MCHC: 30.1 g/dL (ref 30.0–36.0)
MCV: 95.9 fL (ref 80.0–100.0)
Platelets: 438 10*3/uL — ABNORMAL HIGH (ref 150–400)
RBC: 2.7 MIL/uL — ABNORMAL LOW (ref 3.87–5.11)
RDW: 15.6 % — ABNORMAL HIGH (ref 11.5–15.5)
WBC: 18.5 10*3/uL — ABNORMAL HIGH (ref 4.0–10.5)
nRBC: 0 % (ref 0.0–0.2)

## 2023-01-10 LAB — GLUCOSE, CAPILLARY
Glucose-Capillary: 178 mg/dL — ABNORMAL HIGH (ref 70–99)
Glucose-Capillary: 149 mg/dL — ABNORMAL HIGH (ref 70–99)

## 2023-01-10 MED ORDER — FOLIC ACID 1 MG PO TABS: 1.0000 mg | ORAL_TABLET | Freq: Every day | ORAL | Status: AC

## 2023-01-10 MED ORDER — HYDROMORPHONE HCL 4 MG PO TABS: 4.0000 mg | ORAL_TABLET | Freq: Four times a day (QID) | ORAL | 0 refills | Status: DC

## 2023-01-10 MED ORDER — PROSOURCE PLUS PO LIQD: 30.0000 mL | Freq: Two times a day (BID) | ORAL | Status: AC

## 2023-01-10 MED ORDER — NEPRO/CARBSTEADY PO LIQD: 237.0000 mL | Freq: Two times a day (BID) | ORAL | Status: DC

## 2023-01-10 MED ORDER — ALTEPLASE 2 MG IJ SOLR: 2.0000 mg | Freq: Once | INTRAMUSCULAR | Status: DC | PRN

## 2023-01-10 MED ORDER — JUVEN PO PACK: 1.0000 | PACK | Freq: Two times a day (BID) | ORAL | Status: DC

## 2023-01-10 MED ORDER — INSULIN GLARGINE-YFGN 100 UNIT/ML ~~LOC~~ SOLN: 8.0000 [IU] | Freq: Every day | SUBCUTANEOUS | Status: DC

## 2023-01-10 MED ORDER — HEPARIN SODIUM (PORCINE) 1000 UNIT/ML DIALYSIS
5000.0000 [IU] | Freq: Once | INTRAMUSCULAR | Status: AC
  Administered 2023-01-10: 5000 [IU] via INTRAVENOUS_CENTRAL
  Filled 2023-01-10 (×2): qty 5

## 2023-01-10 MED ORDER — ASCORBIC ACID 1000 MG PO TABS: 1000.0000 mg | ORAL_TABLET | Freq: Every day | ORAL | Status: DC

## 2023-01-10 MED ORDER — HYDROXYZINE HCL 25 MG PO TABS: 25.0000 mg | ORAL_TABLET | Freq: Three times a day (TID) | ORAL | Status: DC | PRN

## 2023-01-10 MED ORDER — HYDROMORPHONE HCL 1 MG/ML IJ SOLN
2.0000 mg | Freq: Once | INTRAMUSCULAR | Status: AC
  Administered 2023-01-10: 2 mg via INTRAMUSCULAR
  Filled 2023-01-10: qty 2

## 2023-01-10 MED ORDER — HEPARIN SODIUM (PORCINE) 1000 UNIT/ML IJ SOLN
INTRAMUSCULAR | Status: AC
  Administered 2023-01-10: 3800 [IU]
  Filled 2023-01-10: qty 4

## 2023-01-10 MED ORDER — LOPERAMIDE HCL 2 MG PO CAPS: 2.0000 mg | ORAL_CAPSULE | ORAL | Status: DC | PRN

## 2023-01-10 MED ORDER — HEPARIN SODIUM (PORCINE) 1000 UNIT/ML DIALYSIS: 1000.0000 [IU] | INTRAMUSCULAR | Status: DC | PRN

## 2023-01-10 MED ORDER — ZINC SULFATE 220 (50 ZN) MG PO CAPS: 220.0000 mg | ORAL_CAPSULE | Freq: Every day | ORAL | Status: DC

## 2023-01-10 MED ORDER — ANTICOAGULANT SODIUM CITRATE 4% (200MG/5ML) IV SOLN: 5.0000 mL | Status: DC | PRN

## 2023-01-10 MED ORDER — METOPROLOL TARTRATE 50 MG PO TABS: 50.0000 mg | ORAL_TABLET | Freq: Two times a day (BID) | ORAL | Status: DC

## 2023-01-10 MED ORDER — METHOCARBAMOL 500 MG PO TABS: 500.0000 mg | ORAL_TABLET | Freq: Four times a day (QID) | ORAL | Status: AC | PRN

## 2023-01-10 NOTE — TOC Transition Note (Signed)
Transition of Care [facility]) - CM/SW Discharge Note   Patient Details  Name: Deborah [name] MRN: [id number] Date of Birth: 09/13/1965  Transition of Care [facility]) CM/SW Contact:  Erin Sons, LCSW Phone Number: 01/10/2023, 12:42 PM   Clinical Narrative:     Berkley Harvey approved 11/1- 11/5. [id number]  OPHD clinic temporarily changed to [facility] where [facility] can accommodate.   Per MD patient ready for DC to [facility]. RN, patient, patient's family, and facility notified of DC. Discharge Summary and FL2 sent to facility. RN to call report prior to discharge [phone]). DC packet on chart. Ambulance transport requested for patient.   CSW will sign off for now as social work intervention is no longer needed. Please consult [place] again if new needs arise.   Final next level of care: Skilled Nursing Facility Barriers to Discharge: No Barriers Identified   Patient Goals and CMS Choice      Discharge Placement                Patient [facility] bed at: [facility] Patient to be transferred to facility by: [facility] Name of family member notified: Left message with mom Patient and family notified of of transfer: 01/10/23  Discharge Plan and Services Additional resources added to the After Visit Summary for                                       Social Determinants of Health (SDOH) Interventions SDOH Screenings   Food Insecurity: No Food Insecurity (12/15/2022)  Housing: Patient Declined (12/15/2022)  Transportation Needs: No Transportation Needs (12/15/2022)  Utilities: Not At Risk (12/15/2022)  Depression (PHQ2-9): High Risk (11/18/2022)  Financial Resource Strain: Low Risk (03/23/2021)   [facility])  Physical Activity: Not on File (10/31/2017)   Received from [place], [place]  Social Connections: Unknown (07/19/2021)   [facility], [facility]  Stress: Low Risk (03/23/2021)   [facility])  Tobacco Use: Low Risk  (12/31/2022)     Readmission Risk Interventions    11/25/2022    5:16 PM 07/05/2022    1:20 PM  Readmission Risk Prevention Plan  Transportation Screening Complete Complete  Medication Review (RN Care Manager) Complete Referral to Pharmacy  PCP or Specialist appointment within 3-5 days of discharge Complete Complete  HRI or Home Care Consult Complete Complete  SW Recovery Care/Counseling Consult Complete Complete  Palliative Care Screening Not Applicable Not Applicable  Skilled Nursing Facility Not Applicable Not Applicable

## 2023-01-10 NOTE — Progress Notes (Addendum)
Contacted [facility] admissions this morning to request an update on pt's transfer request. Pt stable for d/c per attending. Awaiting response from [facility].   Olivia Canter Renal Navigator [phone]  Addendum at 9:45 am: Pt has been accepted at [facility] on MWF 5:35 am chair time. Pt can start on Wednesday and will need to arrive at 5:15 am to complete paperwork prior to treatment. Clinic only has this appt available at this time. Update provided to attending, renal PA, pt's RN, and CSW. CSW will need to confirm that snf can accommodate this appt time.   Addendum at 10:11 am: CSW confirmed that snf can accommodate appt days/times. Pt will d/c today. Contacted [facility] to advise clinic of pt's d/c today and that pt will start on Wed as planned. Renal PA to send orders to clinic and HD arrangements added to pt's AVS as well.

## 2023-01-10 NOTE — Procedures (Signed)
HD Note:  Some information was entered later than the data was gathered due to patient care needs. The stated time with the data is accurate.  Received patient in bed to unit.   Alert and oriented.   Informed consent signed and in chart.   Access used: Right upper chest HD catheter Access issues: None  Patient BP was soft and UF had to be turned off at times.  See flowsheet Patient chose to end treatment.  She stated that she did not get the pain meds she wanted and was in pain.  Meds were given, see MAR, but she wanted the IV dilaudid that had been discontinued.  Patient let me know at the time this was discussed that she would not be staying on treatment for long. AMA papers signed.   TX duration: 1 hour and 39 min  Alert, without acute distress.  Total UF removed: 400  Hand-off given to patient's nurse.   Transported back to the room   Matej Sappenfield L. Dareen Piano, RN [facility] Dialysis Unit.

## 2023-01-10 NOTE — Progress Notes (Signed)
[facility] Progress Note   Subjective:   Patient seen in HD unit, seating in chair. Reports she is having pain "all over" but is happy to be out of bed. Denies SOB, CP, dizziness, nausea.   Objective Vitals:   01/10/23 0000 01/10/23 0400 01/10/23 0558 01/10/23 0741  BP:   135/78 117/68  Pulse:   88 90  Resp: 18 18 18 18   Temp:   98.3 F (36.8 C)   TempSrc:   Oral   SpO2:   100% 100%  Weight:      Height:       Physical Exam General: Alert female in NAD Heart: RRR, no murmurs, rubs or gallops Lungs: CTA bilaterally, on room air Abdomen: Soft, non-distended, +BS Extremities: Trace edema LLE, R BKA Dialysis Access: LUE AVF + bruit, R internal jugular [facility]  Additional Objective Labs: Basic Metabolic Panel: Recent Labs  Lab 01/05/23 0705 01/06/23 0431 01/07/23 0410 01/08/23 0830  NA 133* 133* 133* 133*  K 3.2* 3.5 3.8 3.3*  CL 100 97* 99 99  CO2 25 28 23 23   GLUCOSE 121* 118* 129* 152*  BUN 20 9 17  22*  CREATININE 4.26* 2.94* 4.31* 5.25*  CALCIUM 8.2* 8.3* 8.3* 8.8*  PHOS 2.7  --  3.0 3.9   Liver Function Tests: Recent Labs  Lab 01/05/23 0705 01/07/23 0410 01/08/23 0830  ALBUMIN 1.9* 2.0* 2.1*   No results for input(s): "LIPASE", "AMYLASE" in the last 168 hours. CBC: Recent Labs  Lab 01/05/23 0705 01/06/23 0431 01/07/23 0410 01/08/23 0830  WBC 13.5* 22.0* 20.0* 17.1*  NEUTROABS 9.2*  --  14.5*  --   HGB 7.0* 8.4* 7.9* 8.2*  HCT 23.1* 27.9* 26.2* 26.9*  MCV 96.7 94.9 94.6 95.1  PLT 384 392 423* 481*   Blood Culture    Component Value Date/Time   SDES BLOOD SITE NOT SPECIFIED 11/23/2022 0023   SPECREQUEST  11/23/2022 0023    BOTTLES DRAWN AEROBIC AND ANAEROBIC Blood Culture adequate volume   CULT  11/23/2022 0023    NO GROWTH 5 DAYS Performed at [facility], [address] [place], [place] [address]    REPTSTATUS 11/28/2022 FINAL 11/23/2022 0023    Cardiac Enzymes: No results for input(s): "CKTOTAL", "CKMB",  "CKMBINDEX", "TROPONINI" in the last 168 hours. CBG: Recent Labs  Lab 01/09/23 0737 01/09/23 1110 01/09/23 1702 01/09/23 2104 01/10/23 0743  GLUCAP 163* 228* 236* 201* 178*   Iron Studies: No results for input(s): "IRON", "TIBC", "TRANSFERRIN", "FERRITIN" in the last 72 hours. @lablastinr3 @ Studies/Results: No results found. Medications:  anticoagulant sodium citrate     anticoagulant sodium citrate      (feeding supplement) PROSource Plus  30 mL Oral BID BM   allopurinol  100 mg Oral BID   vitamin C  1,000 mg Oral Daily   atorvastatin  10 mg Oral QHS   darbepoetin (ARANESP) injection - DIALYSIS  200 mcg Subcutaneous Q Fri-1800   docusate sodium  100 mg Oral Daily   doxercalciferol  7 mcg Intravenous Q M,W,F-HD   feeding supplement (NEPRO CARB STEADY)  237 mL Oral BID BM   folic acid  1 mg Oral Daily   Gerhardt's butt cream   Topical BID   heparin  5,000 Units Dialysis Once in dialysis   insulin aspart  0-6 Units Subcutaneous TID WC   insulin aspart  2 Units Subcutaneous TID WC   insulin glargine-yfgn  8 Units Subcutaneous QHS   LORazepam  1 mg Oral  Once   metoprolol tartrate  50 mg Oral BID   nutrition supplement (JUVEN)  1 packet Oral BID BM   pantoprazole  40 mg Oral Daily   valbenazine  40 mg Oral Daily   zinc sulfate  220 mg Oral Daily    Dialysis Orders: [facility]  4hr, 180dialyzer, 400/800, EDW 126.9kg, 3K/2.5Ca bath, TDC in R chest, 1st stage LUE BVT 06/2022 - Heparin 5000 units IV initial bolus Heparin 2500 units IV mid run - mircera 225 mcg last 10/5 - hectorol 7 mcg  Assessment/Plan: ESRD: Continue HD on MWF schedule - next HD 11/4 in the recliner.  Hypokalemia: Using 3K bath with HD. May need to add supplementation. Right foot osteomyelitis/abscess s/p R BKA on 12/29/22: Per ortho.  BP/volume: BP stable, edema improving and now under EDW. Will have lower EDW on d/c s/p amputation. Anemia (ABLA/GIB + ESRD): S/p EGD Multiple gastric AVMs, treated with APC  and capsule endo 10/17 showing polyps only. Colonoscopy recommended. Getting Aranesp q Friday here. S/p 1 unit PRBCs with HD 10/30, Hgb now is 8.2. Secondary Hyperparathyroidism/Hyperphosphatemia: CorrCa okay but phos dropped, will hold binders for now. Monitor trend. Vascular access: TDC in place, s/p AVF revision/transposition 10/25. AMS/acute toxic encephalopathy (resolved): Med induced, likely. MRI without acute findings.  T2DM: Insulin per primary. Nutrition: Alb very low, continue supplements. Dispo: Renal navigator following, needs HD unit transfer due to SNF. Waiting to hear from [facility]. Patient is able to have HD in recliner as evidenced 01/07/2023. She is stable for discharge.   Rogers Blocker, PA-C 01/10/2023, 8:24 AM  [facility] Pager: [phone]

## 2023-01-10 NOTE — Progress Notes (Signed)
OT Cancellation Note  Patient Details Name: CERENITY GOSHORN MRN: [id number] DOB: 12-08-1965   Cancelled Treatment:    Reason Eval/Treat Not Completed: Patient at procedure or test/ unavailable.  Pt currently in dialysis and per chart she is discharging to SNF later today.  Will continue to check back and see tomorrow if she doesn't discharge.   Kendrick Haapala OTR/L 01/10/2023, 11:26 AM

## 2023-01-10 NOTE — Discharge Summary (Signed)
Physician Discharge Summary   Patient: Deborah [name] MRN: [id number] DOB: 03-05-1966  Admit date:     12/14/2022  Discharge date: 01/10/23  Discharge Physician: Alberteen Sam   PCP: Pcp, No     Recommendations at discharge:  Follow up with [facility] Dr. Myrtie Neither for rectal bleeding in 2-4 weeks Hold Eliquis until evaluated by Gastroenterology Follow up with Dr. Lajoyce Corners [facility] in 1 week for right BKA Check CBC and BMP in 1 week     Discharge Diagnoses: Principal Problem:   Symptomatic anemia Active Problems:   Gastric AVM   Acute osteomyelitis of right calcaneus (HCC)   Morbid obesity (HCC)   Essential hypertension   Insulin dependent type 2 diabetes mellitus (HCC)   Diabetic gastroparesis (HCC)   Chronic diarrhea   NASH (nonalcoholic steatohepatitis)   Hyponatremia   End stage renal disease on dialysis (HCC)   Chronic a-fib (HCC)   (HFpEF) heart failure with preserved ejection fraction (HCC)   Tardive dyskinesia      Hospital Course: 57 y.o. F with ESRD on HD MWF, cAF on Eliquis, dCHF, DM, gastroparesis and low grade neuroendocrine tumor of duodenum, HTN, MO, NASH and chronic right ankle ulcer, recently admitted for ankle pain/swelling, underwent angiogram, ultimately treated for gout, who returned with BRBPR and was found to have Hgb 5.8.    Since the time of her admission she experienced recurrent GI bleeding.  Ultimately she was taken for an EGD and capsule endoscopy that showed oozing AVMs.    Also found to have osteomyelitis of the right ankle and underwent BKA on 10/23      * Symptomatic anemia Gastric AVM Required 4 units PRBC total during this hospital stay   GI consulted and EGD and capsule study noted minimal reflux esophagitis with normal appearing duodenum and 3 small AVMs with 1 having an adherent clot.  Rebleeding (likely slow) expected if she were to resume anticoagulation, so this was held at discharge, recommend GI follow up for  colonoscopy.    On PPI Hold anticoagulation Outpatient GI follow up with Dr. Myrtie Neither    Acute osteomyelitis of right calcaneus [facility]) S/p right BKA on 10/23 by Dr. Lajoyce Corners - Outpatient orthopedics follow up  Morbid obesity (HCC) BMI 43  Tardive dyskinesia Continuous rolling of tongue noted I the hospital.  Trial of valbenazine started 10/27.  She had previously been on antipsychotics, but unclear if she was taking recently.  Valbenazine of uncertain benefit, stopped at discharge.  If tardive dyskinesia resumed, could consider restarting and referral to Psychiatry.  (HFpEF) heart failure with preserved ejection fraction (HCC) Appeared euvolemic at discharge.  Chronic a-fib (HCC) Stable on metoprolol.  Anticoagulation held at discharge, recommend colonoscopy and GI follow up before resumption.  Resumption would carry risk of recurrent anemia given oozing AVMs and ESRD related baseline anemia.   End stage renal disease on dialysis [facility]) Dialyzes MWF.  Chronic pain syndrome  Patient takes hydromorphone 4 mg 80 tablets monthly at baseline from Dr. Jacinto Reap. Her dose was increased temporarily post-op to four times daily.  Reduce to baseline at discharge from SNF.              The [facility] Controlled Substances Registry was reviewed for this patient prior to discharge.  Consultants: Orthopedics, Dr. Lajoyce Corners GI Dr. Myrtie Neither and Dr. Barron Alvine  Procedures performed:   EGD Small bowel capsule endoscopy RIGHT BKA   Disposition: Skilled nursing facility Diet recommendation:  Discharge Diet Orders (From admission, onward)  Start     Ordered   01/10/23 0000  Diet - low sodium heart healthy        01/10/23 1006             DISCHARGE MEDICATION: Allergies as of 01/10/2023       Reactions   Isovue [iopamidol] Anaphylaxis, [name], Other (See Comments)   11/28/17 Patient had seizure like activity and then 1 min code after 100 cc of isovue 300. Possible contrast  allergy vs vasovagal episode Cardiac Arrest   Neurontin [gabapentin] [name], Swelling   Nsaids Anaphylaxis, Other (See Comments)   Hx of stomach ulcers   Penicillins Itching, Palpitations, Other (See Comments)   Flushing (Red Skin) Laryngeal Edema   Reglan [metoclopramide] Other (See Comments)   Tardive dyskinesia   Valium [diazepam] [name]   Zestril [lisinopril] Anaphylaxis, Swelling   Tongue and mouth swelling Laryngeal Edema   Tolectin [tolmetin] Nausea And Vomiting, Nausea Only, Other (See Comments)   Irritates stomach ulcer   Asa [aspirin] Other (See Comments)   Hx of stomach ulcer   Aspartame And Phenylalanine Hives   Bentyl [dicyclomine] Other (See Comments)   Chest pain   Hibiclens [chlorhexidine Gluconate] Other (See Comments)   Dermatitis    Flexeril [cyclobenzaprine] Palpitations   Oxycontin [oxycodone] Palpitations   Rifamycins Palpitations   Tylenol [acetaminophen] Nausea And Vomiting, Nausea Only, Other (See Comments)   Irritates stomach ulcer Abdominal pain   Ultram [tramadol] Nausea And Vomiting, Palpitations        Medication List     STOP taking these medications    apixaban 5 MG Tabs tablet Commonly known as: ELIQUIS   carvedilol 12.5 MG tablet Commonly known as: COREG   hydrALAZINE 50 MG tablet Commonly known as: APRESOLINE   isosorbide mononitrate 60 MG 24 hr tablet Commonly known as: IMDUR   midodrine 5 MG tablet Commonly known as: PROAMATINE   spironolactone 25 MG tablet Commonly known as: ALDACTONE   torsemide 100 MG tablet Commonly known as: DEMADEX       TAKE these medications    (feeding supplement) PROSource Plus liquid Take 30 mLs by mouth 2 (two) times daily between meals.   feeding supplement (NEPRO CARB STEADY) Liqd Take 237 mLs by mouth 2 (two) times daily between meals.   nutrition supplement (JUVEN) Pack Take 1 packet by mouth 2 (two) times daily between meals.   albuterol (2.5  MG/3ML) 0.083% nebulizer solution Commonly known as: PROVENTIL Take 3 mLs (2.5 mg total) by nebulization every 6 (six) hours as needed for wheezing or shortness of breath.   allopurinol 100 MG tablet Commonly known as: ZYLOPRIM Take 1 tablet (100 mg total) by mouth 2 (two) times daily.   ascorbic acid 1000 MG tablet Commonly known as: VITAMIN C Take 1 tablet (1,000 mg total) by mouth daily.   atorvastatin 10 MG tablet Commonly known as: LIPITOR Take 10 mg by mouth daily.   busPIRone 5 MG tablet Commonly known as: BUSPAR Take 5 mg by mouth 2 (two) times daily.   cetirizine 10 MG tablet Commonly known as: ZYRTEC Take 10 mg by mouth daily.   colchicine 0.6 MG tablet Take 0.5 tablets (0.3 mg total) by mouth 2 (two) times a week.   doxercalciferol 4 MCG/2ML injection Commonly known as: HECTOROL Inject 2 mLs (4 mcg total) into the vein every Monday, Wednesday, and Friday with hemodialysis.   Easy Comfort Pen Needles 31G X 5 MM Misc Generic drug: Insulin  Pen Needle USE 3 TIMES A DAY FOR INSULIN ADMINISTRATION   famotidine 20 MG tablet Commonly known as: PEPCID Take 20 mg by mouth daily.   ferric citrate 1 GM 210 MG(Fe) tablet Commonly known as: AURYXIA Take 2 tablets (420 mg total) by mouth 3 (three) times daily with meals. What changed: how much to take   fluticasone 50 MCG/ACT nasal spray Commonly known as: FLONASE Place 2 sprays into both nostrils daily as needed for allergies or rhinitis.   folic acid 1 MG tablet Commonly known as: FOLVITE Take 1 tablet (1 mg total) by mouth daily.   gabapentin 100 MG capsule Commonly known as: NEURONTIN Take 1 capsule (100 mg total) by mouth 3 (three) times daily.   HYDROmorphone 4 MG tablet Commonly known as: Dilaudid Take 1 tablet (4 mg total) by mouth in the morning, at noon, in the evening, and at bedtime. Three times a day as needed 20 days out of the month. What changed:  when to take this reasons to take this    hydrOXYzine 25 MG tablet Commonly known as: ATARAX Take 1 tablet (25 mg total) by mouth 3 (three) times daily as needed for anxiety.   insulin glargine-yfgn 100 UNIT/ML injection Commonly known as: SEMGLEE Inject 0.08 mLs (8 Units total) into the skin at bedtime. What changed:  how much to take when to take this   insulin lispro 100 UNIT/ML KwikPen Commonly known as: HUMALOG Before each meal 3 times a day, 140-199 - 2 units, 200-250 - 6 units, 251-299 - 8 units,  300-349 - 12 units,  350 or above 14 units. What changed:  how much to take how to take this when to take this additional instructions   loperamide 2 MG capsule Commonly known as: IMODIUM Take 1 capsule (2 mg total) by mouth as needed for diarrhea or loose stools.   methocarbamol 500 MG tablet Commonly known as: ROBAXIN Take 1 tablet (500 mg total) by mouth every 6 (six) hours as needed for muscle spasms.   metoprolol tartrate 50 MG tablet Commonly known as: LOPRESSOR Take 1 tablet (50 mg total) by mouth 2 (two) times daily.   mirtazapine 15 MG tablet Commonly known as: REMERON Take 15 mg by mouth at bedtime.   pantoprazole 40 MG tablet Commonly known as: PROTONIX Take 40 mg by mouth 2 (two) times daily.   zinc sulfate 220 (50 Zn) MG capsule Take 1 capsule (220 mg total) by mouth daily.               Discharge Care Instructions  (From admission, onward)           Start     Ordered   01/10/23 0000  Discharge wound care:       Comments: ISCHIUM AND COCCYX: Clean bilateral ischium, coccyx and buttocks wounds with Vashe wound cleanser, cut a small piece of silver hydrofiber to fit bilateral ischial and coccyx wound beds daily and cover with silicone foam.  Buttocks wounds require no treatment other than Gerhardt's and silicone foam.  LEFT HEEL: Clean left heel with Vashe wound cleanser and apply Xeroform gauze to heel daily, cover with dry gauze, Kerlix, and place in boot that was provided to  patient   01/10/23 1006            Follow-up Information     Nadara Mustard, MD Follow up in 1 week(s).   Specialty: Orthopedic Surgery Contact information: [address] [place] [address] [phone]  Discharge Instructions     Diet - low sodium heart healthy   Complete by: As directed    Discharge wound care:   Complete by: As directed    ISCHIUM AND COCCYX: Clean bilateral ischium, coccyx and buttocks wounds with Vashe wound cleanser, cut a small piece of silver hydrofiber to fit bilateral ischial and coccyx wound beds daily and cover with silicone foam.  Buttocks wounds require no treatment other than Gerhardt's and silicone foam.  LEFT HEEL: Clean left heel with Vashe wound cleanser and apply Xeroform gauze to heel daily, cover with dry gauze, Kerlix, and place in boot that was provided to patient   Increase activity slowly   Complete by: As directed        Discharge Exam: Filed Weights   01/07/23 0500 01/08/23 0500  Weight: 121.1 kg 121.2 kg    General: Pt is alert, awake, not in acute distress Cardiovascular: RRR, nl S1-S2, no murmurs appreciated.   No LE edema.   Respiratory: Normal respiratory rate and rhythm.  CTAB without rales or wheezes. Abdominal: Abdomen soft and non-tender.  No distension or HSM.   Neuro/Psych: Strength symmetric in upper and lower extremities.  Judgment and insight appear normal.   Condition at discharge: fair  The results of significant diagnostics from this hospitalization (including imaging, microbiology, ancillary and laboratory) are listed below for reference.   Imaging Studies: CT HEAD WO CONTRAST ( )  Result Date: 01/01/2023 CLINICAL DATA:  Acute neurologic deficit EXAM: CT HEAD WITHOUT CONTRAST TECHNIQUE: Contiguous axial images were obtained from the base of the skull through the vertex without intravenous contrast. RADIATION DOSE REDUCTION: This exam was performed according to the  departmental dose-optimization program which includes automated exposure control, adjustment of the mA and/or kV according to patient size and/or use of iterative reconstruction technique. COMPARISON:  05/29/2022 FINDINGS: Brain: No mass,hemorrhage or extra-axial collection. Normal appearance of the parenchyma and CSF spaces. Vascular: Atherosclerotic calcification of the internal carotid arteries at the skull base. No abnormal hyperdensity of the major intracranial arteries or dural venous sinuses. Skull: The visualized skull base, calvarium and extracranial soft tissues are normal. Sinuses/Orbits: No fluid levels or advanced mucosal thickening of the visualized paranasal sinuses. No mastoid or middle ear effusion. Normal orbits. IMPRESSION: No acute intracranial abnormality. Electronically Signed   By: Deatra Robinson M.D.   On: 01/01/2023 22:54   VAS US DUPLEX DIALYSIS ACCESS (AVF, AVG)  Result Date: 12/29/2022 DIALYSIS ACCESS Patient Name:  EMMANUELLA MIRANTE  Date of Exam:   12/28/2022 Medical Rec #: [id number]             Accession #:    [id number] Date of Birth: [birth year]             Patient Gender: F Patient Age:   37 years Exam Location:  [facility] Procedure:      VAS US DUPLEX DIALYSIS ACCESS (AVF, AVG) Referring Phys: [facility] --------------------------------------------------------------------------------  Reason for Exam: Non-maturation of AVF. Access Site: Left Upper Extremity. Access Type: 06/30/22. History: HTN, DM-2, malignant carcinoid, NAFLD, morbid obesity, AFIB. Performing Technologist: Marilynne Halsted RDMS, RVT  Examination Guidelines: A complete evaluation includes B-mode imaging, spectral Doppler, color Doppler, and power Doppler as needed of all accessible portions of each vessel. Unilateral testing is considered an integral part of a complete examination. Limited examinations for reoccurring indications may be performed as noted.  Findings:  +--------------------+----------+-----------------+--------+ AVF  PSV (cm/s)Flow Vol (mL/min)Comments +--------------------+----------+-----------------+--------+ Native artery inflow   216           634                +--------------------+----------+-----------------+--------+ AVF Anastomosis        470                              +--------------------+----------+-----------------+--------+  +------------+----------+-------------+----------+-----------------------------+ OUTFLOW VEINPSV (cm/s)Diameter (cm)Depth (cm)          Describe            +------------+----------+-------------+----------+-----------------------------+ Shoulder       219        0.47        1.85                                 +------------+----------+-------------+----------+-----------------------------+ Prox UA        131        0.51        1.61                                 +------------+----------+-------------+----------+-----------------------------+ Mid UA         448        0.53        0.95    change in Diameter and 0.48                                                         down 0.29           +------------+----------+-------------+----------+-----------------------------+ Dist UA        382        0.81        0.22                                 +------------+----------+-------------+----------+-----------------------------+ AC Fossa       560        0.77        0.15                                 +------------+----------+-------------+----------+-----------------------------+   Summary: Patent left AVF with a change in diameter in the mid upper arm.  *See table(s) above for measurements and observations.  Diagnosing physician: Coral Else MD Electronically signed by Coral Else MD on 12/29/2022 at 6:21:27 PM.   --------------------------------------------------------------------------------   Final    MR FOOT LEFT WO CONTRAST  Result Date:  12/20/2022 CLINICAL DATA:  Heel pain. Chronic. Persistent confusion and leukocytosis. EXAM: MRI OF THE LEFT FOOT WITHOUT CONTRAST TECHNIQUE: Multiplanar, multisequence MR imaging of the left was performed. No intravenous contrast was administered. COMPARISON:  Left ankle and left foot radiographs 05/30/2015 FINDINGS: Despite efforts by the technologist and patient, mild-to-moderate motion artifact is present on today's exam and could not be eliminated. This reduces exam sensitivity and specificity. TENDONS Peroneal: There is a "chevron configuration of the peroneus brevis tendon starting at the level of the distal fibular metadiaphysis and involving an approximate 5 cm  length of the tendon (axial series 4 images 12 through 26, to the level of the inferior border of the anterior process of the calcaneus. Minimal tenosynovitis. The peroneus longus is intact. Posteromedial: Mild distal posterior tibial intermediate T2 signal tendinosis (axial images 23-28). The flexor digitorum longus tendon is intact. Mild flexor hallucis longus tenosynovitis. Anterior: The tibialis anterior, extensor hallucis longus, and extensor digitorum longus tendons are intact. Achilles: Mild intermediate T2 signal Achilles tendinosis along an approximate 2.5 cm length of the distal Achilles tendon, greatest distally (sagittal series 7, image 13). No fluid bright tear Plantar Fascia: Moderate plantar calcaneal heel spur. The plantar fascia origin is intact. LIGAMENTS Lateral: Mild attenuation of the anterior talofibular ligament, possibly the sequela of remote trauma. No full-thickness tear. The calcaneofibular, posterior talofibular, and anterior and posterior tibiofibular ligaments are intact. Medial: The tibiotalar deep deltoid and tibial spring ligaments are intact. CARTILAGE Ankle Joint: Mild-to-moderate tibiotalar cartilage thinning. There is likely full-thickness cartilage loss focally at the anteromedial aspect of the talar dome with  moderate focal subchondral marrow edema (coronal series 5, image 19 and sagittal series 7, image 12). Subtalar Joints/Sinus Tarsi: Within limitation of motion artifact, there appears to be mild-to-moderate posterior and middle subtalar joint cartilage thinning.Fat is preserved within the sinus tarsi. Bones: Moderate dorsal talonavicular, navicular-cuneiform, and tarsometatarsal joint space narrowing and peripheral osteophytes. Other: There is moderate edema within the abductor hallucis longus and the flexor digitorum longus muscles. High-grade edema and likely mild atrophy of the abductor digiti minimi muscle (coronal series 6, image 29 and axial series 4, image 32). There is moderate edema and swelling of the plantar posterolateral subcutaneous fat at the level of the posterior calcaneus. In this region there is mild-to-moderate patchy marrow edema within the adjacent plantar posterolateral aspect of the calcaneus with some areas of mild cortical thinning (e.g. coronal series 5, image 33 and axial series 4, image 30) suspicious for early osteomyelitis. The marrow edema extends up to approximately 2 cm in craniocaudal dimension and 2 cm along the longitudinal dimension of the posterolateral calcaneus. IMPRESSION: 1. Moderate edema and swelling of the plantar posterolateral subcutaneous fat at the level of the posterior calcaneus. In this region there is mild-to-moderate patchy marrow edema within the adjacent plantar posterolateral aspect of the calcaneus with some areas of mild cortical thinning suspicious for early osteomyelitis. 2. Moderate edema within the abductor hallucis longus and the flexor digitorum longus muscles. High-grade edema and likely mild atrophy of the abductor digiti minimi muscle. This myositis is nonspecific and may be related to denervation. 3. Mild distal posterior tibial and Achilles tendinosis. 4. Mild flexor hallucis longus tenosynovitis. 5. Moderate plantar calcaneal heel spur. 6. Focal  full-thickness cartilage loss at the anteromedial aspect of the talar dome with moderate focal subchondral marrow edema. Electronically Signed   By: Neita Garnet M.D.   On: 12/20/2022 09:04   MR BRAIN WO CONTRAST  Result Date: 12/15/2022 CLINICAL DATA:  Initial evaluation for mental status change, unknown cause. EXAM: MRI HEAD WITHOUT CONTRAST TECHNIQUE: Multiplanar, multiecho pulse sequences of the brain and surrounding structures were obtained without intravenous contrast. COMPARISON:  Prior study from 05/29/2022. FINDINGS: Brain: Examination technically limited due to extensive motion artifact. Additionally, and axial T1 and coronal T2 weighted sequences are not provided or available for review. Cerebral volume within normal limits patchy signal abnormality seen involving the pons, most likely chronic microvascular ischemic disease. No evidence for acute or subacute ischemia. Gray-white matter differentiation maintained. No areas of chronic cortical  infarction. No visible acute or chronic intracranial blood products. No mass lesion, midline shift, or mass effect. No hydrocephalus or extra-axial fluid collection. Pituitary gland and suprasellar region within normal limits. Vascular: Major intracranial vascular flow voids are maintained. Skull and upper cervical spine: Craniocervical junction within normal limits. Bone marrow signal intensity grossly normal. No scalp soft tissue abnormality. Sinuses/Orbits: Globes and orbital soft tissues within normal limits. Prior bilateral ocular lens replacement. No significant paranasal sinus disease. Trace bilateral mastoid effusions noted, of doubtful significance. Other: None. IMPRESSION: 1. Technically limited exam due to extensive motion artifact. 2. No acute intracranial abnormality. 3. Mild chronic microvascular ischemic disease involving the pons. Otherwise negative brain MRI for age. Electronically Signed   By: Rise Mu M.D.   On: 12/15/2022 20:02    CT ABDOMEN PELVIS WO CONTRAST  Result Date: 12/14/2022 CLINICAL DATA:  Acute, nonlocalized abdominal pain. Weakness with diarrhea for 4 days, nausea, and vomiting. EXAM: CT ABDOMEN AND PELVIS WITHOUT CONTRAST TECHNIQUE: Multidetector CT imaging of the abdomen and pelvis was performed following the standard protocol without IV contrast. RADIATION DOSE REDUCTION: This exam was performed according to the departmental dose-optimization program which includes automated exposure control, adjustment of the mA and/or kV according to patient size and/or use of iterative reconstruction technique. COMPARISON:  11/25/2022 FINDINGS: Lower chest:  No contributory findings. Hepatobiliary: No focal liver abnormality.Cholecystectomy. No biliary dilatation Pancreas: Unremarkable. Spleen: Unremarkable. Adrenals/Urinary Tract: Negative adrenals. No hydronephrosis or stone. Symmetric perinephric stranding that is nonspecific. Unremarkable bladder. Stomach/Bowel:  No obstruction. No appendicitis. Vascular/Lymphatic: No acute vascular abnormality. Atheromatous calcification of the aorta and iliacs. No mass or adenopathy. Reproductive:No pathologic findings. Exophytic uterine fibroid towards the left at the fundus measuring nearly 5 cm Other: No ascites or pneumoperitoneum. Musculoskeletal: No acute abnormalities. Lumbar spine degeneration with mild L4-5 anterolisthesis. IMPRESSION: No acute finding or change since prior. No bowel obstruction or visible inflammation. Electronically Signed   By: Tiburcio Pea M.D.   On: 12/14/2022 19:18    Microbiology: Results for orders placed or performed during the hospital encounter of 12/14/22  Surgical PCR screen     Status: Abnormal   Collection Time: 12/28/22 11:06 PM   Specimen: Nasal Mucosa; Nasal Swab  Result Value Ref Range Status   MRSA, PCR NEGATIVE NEGATIVE Final   Staphylococcus aureus POSITIVE (A) NEGATIVE Final    Comment: (NOTE) The Xpert SA Assay (FDA approved for  NASAL specimens in patients 33 years of age and older), is one component of a comprehensive surveillance program. It is not intended to diagnose infection nor to guide or monitor treatment. Performed at [facility], [address], [place], [place] [address]     Labs: CBC: Recent Labs  Lab 01/05/23 [phone] 01/06/23 0431 01/07/23 0410 01/08/23 0830  WBC 13.5* 22.0* 20.0* 17.1*  NEUTROABS 9.2*  --  14.5*  --   HGB 7.0* 8.4* 7.9* 8.2*  HCT 23.1* 27.9* 26.2* 26.9*  MCV 96.7 94.9 94.6 95.1  PLT 384 392 423* 481*   Basic Metabolic Panel: Recent Labs  Lab 01/03/23 1521 01/05/23 0705 01/06/23 0431 01/07/23 0410 01/08/23 0830  NA 134* 133* 133* 133* 133*  K 3.2* 3.2* 3.5 3.8 3.3*  CL 100 100 97* 99 99  CO2 24 25 28 23 23   GLUCOSE 116* 121* 118* 129* 152*  BUN 36* 20 9 17  22*  CREATININE 4.72* 4.26* 2.94* 4.31* 5.25*  CALCIUM 7.8* 8.2* 8.3* 8.3* 8.8*  PHOS 3.9 2.7  --  3.0 3.9  Liver Function Tests: Recent Labs  Lab 01/03/23 1521 01/05/23 0705 01/07/23 0410 01/08/23 0830  ALBUMIN 1.8* 1.9* 2.0* 2.1*   CBG: Recent Labs  Lab 01/09/23 0737 01/09/23 1110 01/09/23 1702 01/09/23 2104 01/10/23 0743  GLUCAP 163* 228* 236* 201* 178*    Discharge time spent: approximately 35 minutes spent on discharge counseling, evaluation of patient on day of discharge, and coordination of discharge planning with nursing, social work, pharmacy and case management  Signed: Alberteen Sam, MD [facility] 01/10/2023

## 2023-01-11 NOTE — Discharge Planning (Signed)
[facility] Patient Discharge Orders- [facility] CLINIC: [place]  Patient's name: Deborah [name] Admit/DC Dates: 12/14/2022 - 01/10/2023  Discharge Diagnoses: Symptomatic anemia, gastric AVM   Acute osteomyelitis of right calcaneous s/p BKA Tardive dyskinesia  Aranesp: Given: Yes   Date and amount of last dose: on 01/07/23  Last Hgb: 7.8 PRBC's Given: Yes Date/# of units: 1 unit PRBC on 01/05/23 ESA dose for discharge: mircera 225 mcg IV q 2 weeks  IV Iron dose at discharge: none  Heparin change: Yes- no heparin, GI bleed  EDW Change: Yes New EDW: 121kg  Bath Change: No- continue 3K bath and weekly K+ checks  Access intervention/Change: No Details:  Hectorol/Calcitriol change: No  Discharge Labs: Calcium 8.3 Phosphorus 5.3 Albumin 1.9 K+ 3.2  IV Antibiotics: No Details:  On Coumadin?: No Last INR: Next INR: Managed By:   OTHER/APPTS/LAB ORDERS: Off binders for now due to low phos    D/C Meds to be reconciled by nurse after every discharge.  Completed By: Rogers Blocker, PA-C 01/11/2023, 10:42 AM  [facility] Pager: [phone]    Reviewed by: MD:______ RN_______

## 2023-01-12 NOTE — Progress Notes (Deleted)
Subjective:    Patient ID: Deborah [name], female    DOB: 1965-03-13, 57 y.o.   MRN: [id number]  HPI   Pain Inventory Average Pain {NUMBERS; 0-10:5044} Pain Right Now {NUMBERS; 0-10:5044} My pain is {PAIN DESCRIPTION:21022940}  In the last 24 hours, has pain interfered with the following? General activity {NUMBERS; 0-10:5044} Relation with others {NUMBERS; 0-10:5044} Enjoyment of life {NUMBERS; 0-10:5044} What TIME of day is your pain at its worst? {time of day:24191} Sleep (in general) {BHH GOOD/FAIR/POOR:22877}  Pain is worse with: {ACTIVITIES:21022942} Pain improves with: {PAIN IMPROVES WUJW:11914782} Relief from Meds: {NUMBERS; 0-10:5044}  Family History  Problem Relation Age of Onset   Diabetes Mother    Diabetes Father    Heart disease Father    Diabetes Sister    Congestive Heart Failure Sister 64   Diabetes Brother    Social History   Socioeconomic History   Marital status: Married    Spouse name: Not on file   Number of children: Not on file   Years of education: Not on file   Highest education level: Not on file  Occupational History   Not on file  Tobacco Use   Smoking status: Never   Smokeless tobacco: Never  Vaping Use   Vaping status: Never Used  Substance and Sexual Activity   Alcohol use: No   Drug use: No   Sexual activity: Not Currently    Birth control/protection: None  Other Topics Concern   Not on file  Social History Narrative   ** Merged History Encounter **       Social Determinants of Health   Financial Resource Strain: Low Risk (03/23/2021)   [facility]   Financial Resource Strain  Food Insecurity: No Food Insecurity (12/15/2022)   Hunger Vital Sign    Worried About Running Out of Food in the Last Year: Never true    Ran Out of Food in the Last Year: Never true  Transportation Needs: No Transportation Needs (12/15/2022)   PRAPARE - Administrator, Civil Service (Medical):  No    Lack of Transportation (Non-Medical): No  Physical Activity: Not on File (10/31/2017)   Received from [place], [place]   Physical Activity    Physical Activity: 0  Stress: Low Risk (03/23/2021)   [facility])   Stress    Over the last 2 weeks, how often have you been bothered by the following problems: feeling nervous, anxious, on edge?: Not at all    Over the last 2 weeks, how often have you been bothered by the following problems: Not being able to stop or control worrying?: Not at all  Social Connections: Unknown (07/19/2021)   [facility]   Social Network    Social Network: Not on file   Past Surgical History:  Procedure Laterality Date   ABDOMINAL AORTOGRAM W/LOWER EXTREMITY N/A 11/29/2022   Procedure: ABDOMINAL AORTOGRAM W/LOWER EXTREMITY;  Surgeon: Daria Pastures, MD;  Location: [facility] INVASIVE CV LAB;  Service: Cardiovascular;  Laterality: N/A;   AMPUTATION Right 12/29/2022   Procedure: RIGHT BELOW KNEE AMPUTATION;  Surgeon: Nadara Mustard, MD;  Location: [facility] OR;  Service: Orthopedics;  Laterality: Right;   AV FISTULA PLACEMENT Left 06/30/2022   Procedure: LEFT BRACHIOCEPHALIC ARTERIOVENOUS (AV) FISTULA CREATION;  Surgeon: Cephus Shelling, MD;  Location: [facility] OR;  Service: Vascular;  Laterality: Left;   BIOPSY  07/27/2019   Procedure:  BIOPSY;  Surgeon: Vida Rigger, MD;  Location: [facility] ENDOSCOPY;  Service: Endoscopy;;   BIOPSY  07/30/2019   Procedure: BIOPSY;  Surgeon: Kathi Der, MD;  Location: [facility] ENDOSCOPY;  Service: Gastroenterology;;   CATARACT EXTRACTION  01/2014   CHOLECYSTECTOMY     COLONOSCOPY WITH PROPOFOL N/A 07/30/2019   Procedure: COLONOSCOPY WITH PROPOFOL;  Surgeon: Kathi Der, MD;  Location: [facility] ENDOSCOPY;  Service: Gastroenterology;  Laterality: N/A;   ESOPHAGOGASTRODUODENOSCOPY N/A 07/27/2019   Procedure: ESOPHAGOGASTRODUODENOSCOPY (EGD);  Surgeon: Vida Rigger,  MD;  Location: [facility] ENDOSCOPY;  Service: Endoscopy;  Laterality: N/A;   ESOPHAGOGASTRODUODENOSCOPY N/A 07/26/2020   Procedure: ESOPHAGOGASTRODUODENOSCOPY (EGD);  Surgeon: Willis Modena, MD;  Location: [facility] ENDOSCOPY;  Service: Endoscopy;  Laterality: N/A;   ESOPHAGOGASTRODUODENOSCOPY (EGD) WITH PROPOFOL N/A 08/02/2019   Procedure: ESOPHAGOGASTRODUODENOSCOPY (EGD) WITH PROPOFOL;  Surgeon: Kathi Der, MD;  Location: [facility] ENDOSCOPY;  Service: Gastroenterology;  Laterality: N/A;   ESOPHAGOGASTRODUODENOSCOPY (EGD) WITH PROPOFOL N/A 12/23/2022   Procedure: ESOPHAGOGASTRODUODENOSCOPY (EGD) WITH PROPOFOL;  Surgeon: Shellia Cleverly, DO;  Location: [facility] ENDOSCOPY;  Service: Gastroenterology;  Laterality: N/A;   FISTULA SUPERFICIALIZATION Left 12/31/2022   Procedure: LEFT ARM FISTULA TRANSPOSITION;  Surgeon: Maeola Harman, MD;  Location: [facility] OR;  Service: Vascular;  Laterality: Left;   GIVENS CAPSULE STUDY N/A 12/23/2022   Procedure: GIVENS CAPSULE STUDY;  Surgeon: Shellia Cleverly, DO;  Location: [facility] ENDOSCOPY;  Service: Gastroenterology;  Laterality: N/A;   HEMOSTASIS CLIP PLACEMENT  08/02/2019   Procedure: HEMOSTASIS CLIP PLACEMENT;  Surgeon: Kathi Der, MD;  Location: [facility] ENDOSCOPY;  Service: Gastroenterology;;   HOT HEMOSTASIS N/A 12/23/2022   Procedure: HOT HEMOSTASIS (ARGON PLASMA COAGULATION/BICAP);  Surgeon: Shellia Cleverly, DO;  Location: [facility] ENDOSCOPY;  Service: Gastroenterology;  Laterality: N/A;   IR FLUORO GUIDE CV LINE RIGHT  06/24/2022   IR US GUIDE VASC ACCESS RIGHT  06/24/2022   POLYPECTOMY  07/30/2019   Procedure: POLYPECTOMY;  Surgeon: Kathi Der, MD;  Location: [facility] ENDOSCOPY;  Service: Gastroenterology;;   POLYPECTOMY  08/02/2019   Procedure: POLYPECTOMY;  Surgeon: Kathi Der, MD;  Location: [facility];  Service: Gastroenterology;;   Past Surgical History:  Procedure Laterality Date   ABDOMINAL AORTOGRAM W/LOWER EXTREMITY N/A 11/29/2022   Procedure:  ABDOMINAL AORTOGRAM W/LOWER EXTREMITY;  Surgeon: Daria Pastures, MD;  Location: [facility] INVASIVE CV LAB;  Service: Cardiovascular;  Laterality: N/A;   AMPUTATION Right 12/29/2022   Procedure: RIGHT BELOW KNEE AMPUTATION;  Surgeon: Nadara Mustard, MD;  Location: [facility] OR;  Service: Orthopedics;  Laterality: Right;   AV FISTULA PLACEMENT Left 06/30/2022   Procedure: LEFT BRACHIOCEPHALIC ARTERIOVENOUS (AV) FISTULA CREATION;  Surgeon: Cephus Shelling, MD;  Location: [facility] OR;  Service: Vascular;  Laterality: Left;   BIOPSY  07/27/2019   Procedure: BIOPSY;  Surgeon: Vida Rigger, MD;  [facility] ENDOS[facility];  Service: Endoscopy;;   BIOPSY  07/30/2019   Procedure: BIOPSY;  Surgeon: Kathi Der, MD;  Location: [facility];  Service: Gastroenterology;;   CATARACT EXTRACTION  01/2014   CHOLECYSTECTOMY     COLONOSCOPY WITH PROPOFOL N/A 07/30/2019   Procedure: COLONOSCOPY WITH PROPOFOL;  Surgeon: Kathi Der, MD;  Location: [facility]OSCOPY;  Service: Gastroenterology;  Laterality: N/A;   ESOPHAGOGASTRODUODENOSCOPY N/A 07/27/2019   Procedure: ESOPHAGOGASTRODUODENOSCOPY (EGD);  Surgeon: Vida Rigger, MD;  Location: [facility]COPY;  Service: Endoscopy;  Laterality: N/A;   ESOPHAGOGASTRODUODENOSCOPY N/A 07/26/2020   Procedure: ESOPHAGOGASTRODUODENOSCOPY (EGD);  Surgeon: Willis Modena, MD;  Location: [facility] ENDOSCOPY;  Service: Endoscopy;  Laterality: N/A;   ESOPHAGOGASTRODUODENOSCOPY (EGD) WITH PROPOFOL  N/A 08/02/2019   Procedure: ESOPHAGOGASTRODUODENOSCOPY (EGD) WITH PROPOFOL;  Surgeon: Kathi Der, MD;  Location: [facility];  Service: Gastroenterology;  Laterality: N/A;   ESOPHAGOGASTRODUODENOSCOPY (EGD) WITH PROPOFOL N/A 12/23/2022   Procedure: ESOPHAGOGASTRODUODENOSCOPY (EGD) WITH PROPOFOL;  Surgeon: Shellia Cleverly, DO;  Location: [facility] ENDOSCOPY;  Service: Gastroenterology;  Laterality: N/A;   FISTULA SUPERFICIALIZATION Left 12/31/2022   Procedure: LEFT ARM FISTULA TRANSPOSITION;  Surgeon: Maeola Harman, MD;  Location: [facility] OR;  Service: Vascular;  Laterality: Left;   GIVENS CAPSULE STUDY N/A 12/23/2022   Procedure: GIVENS CAPSULE STUDY;  Surgeon: Shellia Cleverly, DO;  Location: [facility] ENDOSCOPY;  Service: Gastroenterology;  Laterality: N/A;   HEMOSTASIS CLIP PLACEMENT  08/02/2019   Procedure: HEMOSTASIS CLIP PLACEMENT;  Surgeon: Kathi Der, MD;  Location: [facility] ENDOS[facility];  Service: Gastroenterology;;   HOT HEMOSTASIS N/A 12/23/2022   Procedure: HOT HEMOSTASIS (ARGON PLASMA COAGULATION/BICAP);  Surgeon: Shellia Cleverly, DO;  Location: [facility] ENDOSCOPY;  Service: Gastroenterology;  Laterality: N/A;   IR FLUORO GUIDE CV LINE RIGHT  06/24/2022   IR US GUIDE VASC ACCESS RIGHT  06/24/2022   POLYPECTOMY  07/30/2019   Procedure: POLYPECTOMY;  Surgeon: Kathi Der, MD;  Location: [facility];  Service: Gastroenterology;;   POLYPECTOMY  08/02/2019   Procedure: POLYPECTOMY;  Surgeon: Kathi Der, MD;  Location: [facility];  Service: Gastroenterology;;   Past Medical History:  Diagnosis Date   Acute back pain with sciatica, left    Acute back pain with sciatica, right    AKI (acute kidney injury) (HCC)    Anemia, unspecified    Cancer (HCC)    Carcinoid tumor of duodenum    Chest pain with normal coronary angiography 2019   Chronic kidney disease, stage 3b (HCC)    Chronic pain    Chronic systolic CHF (congestive heart failure) (HCC)    Diabetes mellitus    DKA (diabetic ketoacidosis) (HCC)    Drug-seeking behavior    21 hospitalizations and 14 CT a/p in 2 years for N/V and abdominal pain, demanding only IV dilaudid   Elevated troponin    chronic   Esophageal reflux    Fibromyalgia    Gastric ulcer    Gastroparesis    Gout    Hyperlipidemia    Hypertension    Hypokalemia    Hypomagnesemia    Lumbosacral stenosis    LVH (left ventricular hypertrophy)    Morbid obesity (HCC)    NICM (nonischemic cardiomyopathy) (HCC)    PAF (paroxysmal atrial  fibrillation) (HCC)    Stroke (HCC) 02/2011   Thrombocytosis    Vitamin B12 deficiency anemia    LMP 10/10/2012   Opioid Risk Score:   Fall Risk Score:  `1  Depression screen PHQ 2/9     11/18/2022    1:41 PM 09/14/2022    1:23 PM 07/20/2022    1:05 PM 09/16/2021    9:16 AM 06/01/2021    1:55 PM 08/01/2020    2:56 PM 06/05/2020    3:01 PM  Depression screen PHQ 2/9  Decreased Interest 3 1 3 3 1 1 1   Down, Depressed, Hopeless 3 1 2 3 1 1 1   PHQ - 2 Score 6 2 5 6 2 2 2   Altered sleeping 3        Tired, decreased energy 3        Change in appetite 3        Feeling bad or failure about yourself  3  Trouble concentrating 3        Moving slowly or fidgety/restless 3        Suicidal thoughts 3        PHQ-9 Score 27          Review of Systems     Objective:   Physical Exam        Assessment & Plan:

## 2023-01-13 ENCOUNTER — Encounter: Payer: Self-pay | Admitting: Orthopedic Surgery

## 2023-01-13 ENCOUNTER — Encounter: Payer: 59 | Attending: Registered Nurse | Admitting: Registered Nurse

## 2023-01-13 ENCOUNTER — Ambulatory Visit: Payer: 59 | Admitting: Orthopedic Surgery

## 2023-01-13 DIAGNOSIS — L97421 Non-pressure chronic ulcer of left heel and midfoot limited to breakdown of skin: Secondary | ICD-10-CM

## 2023-01-13 DIAGNOSIS — Z89511 Acquired absence of right leg below knee: Secondary | ICD-10-CM | POA: Diagnosis not present

## 2023-01-13 DIAGNOSIS — Z5181 Encounter for therapeutic drug level monitoring: Secondary | ICD-10-CM | POA: Insufficient documentation

## 2023-01-13 DIAGNOSIS — Z79891 Long term (current) use of opiate analgesic: Secondary | ICD-10-CM | POA: Insufficient documentation

## 2023-01-13 DIAGNOSIS — G894 Chronic pain syndrome: Secondary | ICD-10-CM | POA: Insufficient documentation

## 2023-01-13 NOTE — Progress Notes (Signed)
Office Visit Note   Patient: Deborah [name]           Date of Birth: 1965/03/28           MRN: [id number] Visit Date: 01/13/2023              Requested by: No referring provider defined for this encounter. PCP: Pcp, No  Chief Complaint  Patient presents with   Right Leg - Routine Post Op    12/29/2022 right BKA       HPI: Patient is a 57 year old woman who is 2 weeks status post right below-knee amputation.  She is on dialysis and just had new access surgery for her left arm.  The wound VAC is removed today.  Assessment & Plan: Visit Diagnoses:  1. Right below-knee amputee (HCC)   2. Non-pressure chronic ulcer of left heel and midfoot limited to breakdown of skin (HCC)     Plan: Patient is at skilled nursing she will have Dial soap cleansing of the right below-knee amputation and dry dressing change daily.  The left heel ulcer will also have dry dressing changes daily and wear a PRAFO 24 hours a day.  Discussed that limb salvage on the left is about 50%.  Follow-Up Instructions: Return in about 2 weeks (around 01/27/2023).   Ortho Exam  Patient is alert, oriented, no adenopathy, well-dressed, normal affect, normal respiratory effort. Examination the wound edges are well-approximated on the right there is no cellulitis or drainage.  Left heel has a ischemic ulcer on the heel but is 1 cm in diameter.  Imaging: No results found.   Labs: Lab Results  Component Value Date   HGBA1C 8.1 (H) 06/19/2022   HGBA1C 10.1 (H) 03/21/2022   HGBA1C 7.5 (H) 08/16/2021   ESRSEDRATE 119 (H) 12/21/2022   ESRSEDRATE 122 (H) 11/24/2022   CRP 14.8 (H) 12/21/2022   CRP 2.7 (H) 11/30/2022   CRP 3.8 (H) 11/29/2022   LABURIC 6.7 11/28/2022   LABURIC 6.2 07/04/2022   LABURIC 8.0 (H) 06/20/2022   REPTSTATUS 11/28/2022 FINAL 11/23/2022   CULT  11/23/2022    NO GROWTH 5 DAYS Performed at [facility], [address] [place], [place] [address]    LABORGA ESCHERICHIA COLI (A)  08/15/2021     Lab Results  Component Value Date   ALBUMIN 1.9 (L) 01/10/2023   ALBUMIN 2.1 (L) 01/08/2023   ALBUMIN 2.0 (L) 01/07/2023    Lab Results  Component Value Date   MG 1.8 12/22/2022   MG 1.5 (L) 12/20/2022   MG 1.8 12/17/2022   No results found for: "VD25OH"  No results found for: "PREALBUMIN"    Latest Ref Rng & Units 01/10/2023   11:15 AM 01/08/2023    8:30 AM 01/07/2023    4:10 AM  CBC EXTENDED  WBC 4.0 - 10.5 K/uL 18.5  17.1  20.0   RBC 3.87 - 5.11 MIL/uL 2.70  2.83  2.77   Hemoglobin 12.0 - 15.0 g/dL 7.8  8.2  7.9   HCT 72.5 - 46.0 % 25.9  26.9  26.2   Platelets 150 - 400 K/uL 438  481  423   NEUT# 1.7 - 7.7 K/uL   14.5   Lymph# 0.7 - 4.0 K/uL   3.6      There is no height or weight on file to calculate BMI.  Orders:  No orders of the defined types were placed in this encounter.  No orders of the defined types  were placed in this encounter.    Procedures: No procedures performed  Clinical Data: No additional findings.  ROS:  All other systems negative, except as noted in the HPI. Review of Systems  Objective: Vital Signs: LMP 10/10/2012   Specialty Comments:  No specialty comments available.  PMFS History: Patient Active Problem List   Diagnosis Date Noted   Tardive dyskinesia 01/06/2023   Peutz-Jeghers polyps of small bowel (HCC) 12/24/2022   Gastric AVM 12/23/2022   Acute osteomyelitis of right calcaneus (HCC) 12/21/2022   Acute on chronic anemia 12/20/2022   Symptomatic anemia 12/14/2022   Pressure injury of skin 11/30/2022   Non-pressure chronic ulcer of right heel and midfoot limited to breakdown of skin (HCC) 11/24/2022   Sepsis (HCC) 11/23/2022   Elevated troponin 09/09/2022   Nausea & vomiting 09/09/2022   End stage renal disease on dialysis (HCC) 09/09/2022   Chronic a-fib (HCC) 09/09/2022   Atrial fibrillation with RVR (HCC)-resolved 09/09/2022   Peripheral neuropathy 09/09/2022   (HFpEF) heart failure with  preserved ejection fraction (HCC) 09/09/2022   Hypotension 09/09/2022   CHF (congestive heart failure) (HCC) 07/13/2022   HCAP (healthcare-associated pneumonia) 06/19/2022   Acute encephalopathy 05/29/2022   Hyponatremia 05/29/2022   Chronic anemia 05/22/2022   Hyperosmolar hyperglycemic state (HHS) (HCC) 05/11/2022   Hyperosmolar non-ketotic state due to type 2 diabetes mellitus (HCC) 05/11/2022   Leukocytosis 05/11/2022   Edema of left upper extremity 03/21/2022   Tachycardia 03/18/2022   Atypical chest pain 09/10/2021   Acute pyelonephritis    PAF (paroxysmal atrial fibrillation) (HCC)    Chronic pain 09/04/2019   Malignant carcinoid tumor of duodenum (HCC)    NASH (nonalcoholic steatohepatitis) 06/05/2019   Chronic diarrhea    Restrictive lung disease secondary to obesity    History of gastric ulcer    Fibroid uterus 02/23/2019   Abdominal pain 07/17/2018   Anxiety 11/29/2017   Spinal stenosis, lumbar region with neurogenic claudication 08/03/2017   GERD (gastroesophageal reflux disease) 03/19/2017   Depression 03/19/2017   Morbid obesity (HCC)    Normocytic anemia 08/16/2016   Diabetic gastroparesis (HCC) 08/16/2016   Gout 06/05/2016   Chronic hypokalemia 09/26/2015   Hypomagnesemia and hypokalemia 09/26/2015   Insulin dependent type 2 diabetes mellitus (HCC) 05/25/2015   Essential hypertension 09/28/2013   Past Medical History:  Diagnosis Date   Acute back pain with sciatica, left    Acute back pain with sciatica, right    AKI (acute kidney injury) (HCC)    Anemia, unspecified    Cancer (HCC)    Carcinoid tumor of duodenum    Chest pain with normal coronary angiography 2019   Chronic kidney disease, stage 3b (HCC)    Chronic pain    Chronic systolic CHF (congestive heart failure) (HCC)    Diabetes mellitus    DKA (diabetic ketoacidosis) (HCC)    Drug-seeking behavior    21 hospitalizations and 14 CT a/p in 2 years for N/V and abdominal pain, demanding only IV  dilaudid   Elevated troponin    chronic   Esophageal reflux    Fibromyalgia    Gastric ulcer    Gastroparesis    Gout    Hyperlipidemia    Hypertension    Hypokalemia    Hypomagnesemia    Lumbosacral stenosis    LVH (left ventricular hypertrophy)    Morbid obesity (HCC)    NICM (nonischemic cardiomyopathy) (HCC)    PAF (paroxysmal atrial fibrillation) (HCC)    Stroke (HCC) 02/2011  Thrombocytosis    Vitamin B12 deficiency anemia     Family History  Problem Relation Age of Onset   Diabetes Mother    Diabetes Father    Heart disease Father    Diabetes Sister    Congestive Heart Failure Sister 55   Diabetes Brother     Past Surgical History:  Procedure Laterality Date   ABDOMINAL AORTOGRAM W/LOWER EXTREMITY N/A 11/29/2022   Procedure: ABDOMINAL AORTOGRAM W/LOWER EXTREMITY;  Surgeon: Daria Pastures, MD;  Location: [facility] INVASIVE CV LAB;  Service: Cardiovascular;  Laterality: N/A;   AMPUTATION Right 12/29/2022   Procedure: RIGHT BELOW KNEE AMPUTATION;  Surgeon: Nadara Mustard, MD;  Location: [facility] OR;  Service: Orthopedics;  Laterality: Right;   AV FISTULA PLACEMENT Left 06/30/2022   Procedure: LEFT BRACHIOCEPHALIC ARTERIOVENOUS (AV) FISTULA CREATION;  Surgeon: Cephus Shelling, MD;  Location: [facility] OR;  Service: Vascular;  Laterality: Left;   BIOPSY  07/27/2019   Procedure: BIOPSY;  Surgeon: Vida Rigger, MD;  Location: [facility] ENDOSCOPY;  Service: Endoscopy;;   BIOPSY  07/30/2019   Procedure: BIOPSY;  Surgeon: Kathi Der, MD;  Location: [facility] ENDOSCOPY;  Service: Gastroenterology;;   CATARACT EXTRACTION  01/2014   CHOLECYSTECTOMY     COLONOSCOPY WITH PROPOFOL N/A 07/30/2019   Procedure: COLONOSCOPY WITH PROPOFOL;  Surgeon: Kathi Der, MD;  Location: [facility] ENDOSCOPY;  Service: Gastroenterology;  Laterality: N/A;   ESOPHAGOGASTRODUODENOSCOPY N/A 07/27/2019   Procedure: ESOPHAGOGASTRODUODENOSCOPY (EGD);  Surgeon: Vida Rigger, MD;  Location: [facility] ENDOSCOPY;  Service: Endoscopy;   Laterality: N/A;   ESOPHAGOGASTRODUODENOSCOPY N/A 07/26/2020   Procedure: ESOPHAGOGASTRODUODENOSCOPY (EGD);  Surgeon: Willis Modena, MD;  Location: [facility] ENDOSCOPY;  Service: Endoscopy;  Laterality: N/A;   ESOPHAGOGASTRODUODENOSCOPY (EGD) WITH PROPOFOL N/A 08/02/2019   Procedure: ESOPHAGOGASTRODUODENOSCOPY (EGD) WITH PROPOFOL;  Surgeon: Kathi Der, MD;  Location: [facility] ENDOSCOPY;  Service: Gastroenterology;  Laterality: N/A;   ESOPHAGOGASTRODUODENOSCOPY (EGD) WITH PROPOFOL N/A 12/23/2022   Procedure: ESOPHAGOGASTRODUODENOSCOPY (EGD) WITH PROPOFOL;  Surgeon: Shellia Cleverly, DO;  Location: [facility] ENDOSCOPY;  Service: Gastroenterology;  Laterality: N/A;   FISTULA SUPERFICIALIZATION Left 12/31/2022   Procedure: LEFT ARM FISTULA TRANSPOSITION;  Surgeon: Maeola Harman, MD;  Location: [facility] OR;  Service: Vascular;  Laterality: Left;   GIVENS CAPSULE STUDY N/A 12/23/2022   Procedure: GIVENS CAPSULE STUDY;  Surgeon: Shellia Cleverly, DO;  Location: [facility] ENDOSCOPY;  Service: Gastroenterology;  Laterality: N/A;   HEMOSTASIS CLIP PLACEMENT  08/02/2019   Procedure: HEMOSTASIS CLIP PLACEMENT;  Surgeon: Kathi Der, MD;  Location: [facility] ENDOSCOPY;  Service: Gastroenterology;;   HOT HEMOSTASIS N/A 12/23/2022   Procedure: HOT HEMOSTASIS (ARGON PLASMA COAGULATION/BICAP);  Surgeon: Shellia Cleverly, DO;  Location[facility] ENDOSCOPY;  Service: Gastroenterology;  Laterality: N/A;   IR FLUORO GUIDE CV LINE RIGHT  06/24/2022   IR US GUIDE VASC ACCESS RIGHT  06/24/2022   POLYPECTOMY  07/30/2019   Procedure: POLYPECTOMY;  Surgeon: Kathi Der, MD;  Location: [facility] ENDOSCOPY;  Service: Gastroenterology;;   POLYPECTOMY  08/02/2019   Procedure: POLYPECTOMY;  Surgeon: Kathi Der, MD;  Location: [facility] ENDOSCOPY;  Service: Gastroenterology;;   Social History   Occupational History   Not on file  Tobacco Use   Smoking status: Never   Smokeless tobacco: Never  Vaping Use   Vaping status: Never Used   Substance and Sexual Activity   Alcohol use: No   Drug use: No   Sexual activity: Not Currently    Birth control/protection: None

## 2023-01-21 ENCOUNTER — Emergency Department (HOSPITAL_COMMUNITY)
Admission: EM | Admit: 2023-01-21 | Discharge: 2023-01-22 | Disposition: A | Discharge status: Home / Self Care | Payer: 59 | Attending: Emergency Medicine | Admitting: Emergency Medicine

## 2023-01-21 ENCOUNTER — Emergency Department (HOSPITAL_COMMUNITY): Payer: 59

## 2023-01-21 ENCOUNTER — Telehealth: Payer: Self-pay | Admitting: Orthopedic Surgery

## 2023-01-21 DIAGNOSIS — E11621 Type 2 diabetes mellitus with foot ulcer: Secondary | ICD-10-CM | POA: Diagnosis not present

## 2023-01-21 DIAGNOSIS — N186 End stage renal disease: Secondary | ICD-10-CM | POA: Insufficient documentation

## 2023-01-21 DIAGNOSIS — L97421 Non-pressure chronic ulcer of left heel and midfoot limited to breakdown of skin: Secondary | ICD-10-CM

## 2023-01-21 DIAGNOSIS — L089 Local infection of the skin and subcutaneous tissue, unspecified: Secondary | ICD-10-CM | POA: Diagnosis present

## 2023-01-21 DIAGNOSIS — I120 Hypertensive chronic kidney disease with stage 5 chronic kidney disease or end stage renal disease: Secondary | ICD-10-CM | POA: Insufficient documentation

## 2023-01-21 DIAGNOSIS — Z992 Dependence on renal dialysis: Secondary | ICD-10-CM | POA: Insufficient documentation

## 2023-01-21 DIAGNOSIS — Z79899 Other long term (current) drug therapy: Secondary | ICD-10-CM | POA: Insufficient documentation

## 2023-01-21 DIAGNOSIS — D72829 Elevated white blood cell count, unspecified: Secondary | ICD-10-CM | POA: Diagnosis not present

## 2023-01-21 DIAGNOSIS — T148XXA Other injury of unspecified body region, initial encounter: Secondary | ICD-10-CM

## 2023-01-21 LAB — CBC
HCT: 25.3 % — ABNORMAL LOW (ref 36.0–46.0)
Hemoglobin: 7.6 g/dL — ABNORMAL LOW (ref 12.0–15.0)
MCH: 28.3 pg (ref 26.0–34.0)
MCHC: 30 g/dL (ref 30.0–36.0)
MCV: 94.1 fL (ref 80.0–100.0)
Platelets: 368 10*3/uL (ref 150–400)
RBC: 2.69 MIL/uL — ABNORMAL LOW (ref 3.87–5.11)
RDW: 14.9 % (ref 11.5–15.5)
WBC: 16.7 10*3/uL — ABNORMAL HIGH (ref 4.0–10.5)
nRBC: 0 % (ref 0.0–0.2)

## 2023-01-21 LAB — COMPREHENSIVE METABOLIC PANEL
ALT: 9 U/L (ref 0–44)
AST: 19 U/L (ref 15–41)
Albumin: 2 g/dL — ABNORMAL LOW (ref 3.5–5.0)
Alkaline Phosphatase: 100 U/L (ref 38–126)
Anion gap: 11 (ref 5–15)
BUN: 20 mg/dL (ref 6–20)
CO2: 24 mmol/L (ref 22–32)
Calcium: 8.3 mg/dL — ABNORMAL LOW (ref 8.9–10.3)
Chloride: 99 mmol/L (ref 98–111)
Creatinine, Ser: 4.09 mg/dL — ABNORMAL HIGH (ref 0.44–1.00)
GFR, Estimated: 12 mL/min — ABNORMAL LOW (ref >60)
Glucose, Bld: 176 mg/dL — ABNORMAL HIGH (ref 70–99)
Potassium: 4.3 mmol/L (ref 3.5–5.1)
Sodium: 134 mmol/L — ABNORMAL LOW (ref 135–145)
Total Bilirubin: 1.2 mg/dL — ABNORMAL HIGH (ref <1.2)
Total Protein: 6.3 g/dL — ABNORMAL LOW (ref 6.5–8.1)

## 2023-01-21 LAB — I-STAT CG4 LACTIC ACID, ED: Lactic Acid, Venous: 1.8 mmol/L (ref 0.5–1.9)

## 2023-01-21 MED ORDER — ONDANSETRON HCL 4 MG/2ML IJ SOLN
INTRAMUSCULAR | Status: AC
  Filled 2023-01-21: qty 2

## 2023-01-21 MED ORDER — MORPHINE SULFATE (PF) 4 MG/ML IV SOLN
4.0000 mg | Freq: Once | INTRAVENOUS | Status: AC
  Administered 2023-01-21: 4 mg via INTRAVENOUS
  Filled 2023-01-21: qty 1

## 2023-01-21 MED ORDER — CEFDINIR 300 MG PO CAPS: 300.0000 mg | ORAL_CAPSULE | Freq: Every day | ORAL | 0 refills | Status: DC

## 2023-01-21 MED ORDER — SODIUM CHLORIDE 0.9 % IV SOLN
1.0000 g | Freq: Once | INTRAVENOUS | Status: AC
  Administered 2023-01-21: 1 g via INTRAVENOUS
  Filled 2023-01-21: qty 10

## 2023-01-21 NOTE — ED Notes (Signed)
[facility] CALLED AND TRANSPORT SCHEDULED.

## 2023-01-21 NOTE — Telephone Encounter (Signed)
I called and sw wound care nurse and advised per Dr. Lajoyce Corners if it appears that there is pus coming from the right BKA then the pt needs to go to the ER for eval. Voiced understanding and will call with any questions.

## 2023-01-21 NOTE — ED Notes (Signed)
IV team at bedside 

## 2023-01-21 NOTE — ED Provider Triage Note (Signed)
Emergency Medicine Provider Triage Evaluation Note  Deborah [name] , a 57 y.o. female  was evaluated in triage.  Pt complains of foot pain and abdominal pain.  Pt sees dr. Lajoyce Corners.  Pt has a ulcer on her foot   Review of Systems  Positive: Abdominal pain Negative: fever  Physical Exam  BP (!) 121/54 (BP Location: Right Arm)   Pulse (!) 108   Temp 98.2 F (36.8 C) (Oral)   Resp 15   Ht 5\' 6"  (1.676 m)   LMP 10/10/2012   SpO2 100%   BMI 43.13 kg/m  Gen:   Awake, no distress   Resp:  Normal effort  MSK:   Ulcer bottom of left foot,   Other:    Medical Decision Making  Medically screening exam initiated at 7:55 PM.  Appropriate orders placed.  Deborah [name] was informed that the remainder of the evaluation will be completed by another provider, this initial triage assessment does not replace that evaluation, and the importance of remaining in the ED until their evaluation is complete.     Elson Areas, [place] 01/21/23 1956

## 2023-01-21 NOTE — Discharge Instructions (Signed)
Take the antibiotics as prescribed.  Follow-up with your doctor to have your wounds rechecked.  Return to the ER for fever worsening symptoms

## 2023-01-21 NOTE — ED Triage Notes (Addendum)
Dialysis MWF. Here for left foot wound x 2 weeks that started draining today. Right BKA. Missed dialysis on Wednesday. Finished dialysis session today. From [facility]. Denies fever/chills.    EMS VS: 110/48 HR 104 99% on RA 237 CBG

## 2023-01-21 NOTE — Telephone Encounter (Signed)
Dawn the wound nurse with Nursing Home called to give patients wound update;  States that there is increased drainage in the left heel, as well as increased drainage (pus looking) in the right BKA. She states patient has follow up on 11/21 You can call her back directly at [phone] Or call facility and ask for supervisor [phone]

## 2023-01-21 NOTE — ED Notes (Signed)
1st lac 1.8 in normal range, 2nd not needed

## 2023-01-21 NOTE — ED Provider Notes (Signed)
[place] EMERGENCY DEPARTMENT AT [facility] Provider Note   [name]: [id number] Arrival date & time: 01/21/23  1845     History {Add pertinent medical, surgical, social history, OB history to HPI:1} Chief Complaint  Patient presents with   Wound Infection    Deborah [name] is a 57 y.o. female.  HPI   Patient has a history of hypertension diabetes diabetic gastroparesis obesity recurrent abdominal pain Nash paroxysmal A-fib end-stage renal disease on dialysis, sepsis, chronic ulcer of the heel, osteomyelitis who presents ED with complaints of a wound on her heel.  Patient states she is at a nursing home.  She had the wound care nurse evaluated and they noticed some drainage today.  They were concerned about the possibility of infection so she was sent to the ED.  Patient denies any fevers.  No vomiting diarrhea.  She has been having persistent aching pain in her leg.    Home Medications Prior to Admission medications   Medication Sig Start Date End Date Taking? Authorizing Provider  cefdinir (OMNICEF) 300 MG capsule Take 1 capsule (300 mg total) by mouth daily. 01/21/23  Yes Linwood Dibbles, MD  albuterol (PROVENTIL) (2.5 MG/3ML) 0.083% nebulizer solution Take 3 mLs (2.5 mg total) by nebulization every 6 (six) hours as needed for wheezing or shortness of breath. 04/06/19   Bing Neighbors, NP  allopurinol (ZYLOPRIM) 100 MG tablet Take 1 tablet (100 mg total) by mouth 2 (two) times daily. 11/30/22   Azucena Fallen, MD  ascorbic acid (VITAMIN C) 1000 MG tablet Take 1 tablet (1,000 mg total) by mouth daily. 01/10/23   Danford, Earl Lites, MD  atorvastatin (LIPITOR) 10 MG tablet Take 10 mg by mouth daily. 12/03/21   [provider]  busPIRone (BUSPAR) 5 MG tablet Take 5 mg by mouth 2 (two) times daily.    [provider]  cetirizine (ZYRTEC) 10 MG tablet Take 10 mg by mouth daily. 08/05/22   [provider]  colchicine 0.6 MG tablet Take 0.5  tablets (0.3 mg total) by mouth 2 (two) times a week. 12/02/22   Azucena Fallen, MD  doxercalciferol (HECTOROL) 4 MCG/2ML injection Inject 2 mLs (4 mcg total) into the vein every Monday, Wednesday, and Friday with hemodialysis. 11/30/22   Azucena Fallen, MD  EASY COMFORT PEN NEEDLES 31G X 5 MM MISC USE 3 TIMES A DAY FOR INSULIN ADMINISTRATION 11/14/19   Meccariello, Solmon Ice, MD  famotidine (PEPCID) 20 MG tablet Take 20 mg by mouth daily.    [provider]  ferric citrate (AURYXIA) 1 GM 210 MG(Fe) tablet Take 2 tablets (420 mg total) by mouth 3 (three) times daily with meals. Patient taking differently: Take 210 mg by mouth 3 (three) times daily with meals. 11/30/22   Azucena Fallen, MD  fluticasone [employer]) 50 MCG/ACT nasal spray Place 2 sprays into both nostrils daily as needed for allergies or rhinitis. 12/19/18   Rai, Delene Ruffini, MD  folic acid (FOLVITE) 1 MG tablet Take 1 tablet (1 mg total) by mouth daily. 01/10/23   Danford, Earl Lites, MD  gabapentin (NEURONTIN) 100 MG capsule Take 1 capsule (100 mg total) by mouth 3 (three) times daily. 11/30/22   Azucena Fallen, MD  HYDROmorphone (DILAUDID) 4 MG tablet Take 1 tablet (4 mg total) by mouth in the morning, at noon, in the evening, and at bedtime. 01/10/23   Danford, Earl Lites, MD  hydrOXYzine (ATARAX) 25 MG tablet Take 1 tablet (25  mg total) by mouth 3 (three) times daily as needed for anxiety. 01/10/23   Danford, Earl Lites, MD  insulin glargine-yfgn (SEMGLEE) 100 UNIT/ML injection Inject 0.08 mLs (8 Units total) into the skin at bedtime. 01/10/23   Danford, Earl Lites, MD  insulin lispro (HUMALOG) 100 UNIT/ML KwikPen Before each meal 3 times a day, 140-199 - 2 units, 200-250 - 6 units, 251-299 - 8 units,  300-349 - 12 units,  350 or above 14 units. Patient taking differently: Inject 2-14 Units into the skin See admin instructions. Before each meal and at bedtime. 140-199 - 2 units, 200-250 - 6 units,  251-299 - 8 units,  300-349 - 12 units,  350 or above 14 units. 06/04/22   Leroy Sea, MD  loperamide (IMODIUM) 2 MG capsule Take 1 capsule (2 mg total) by mouth as needed for diarrhea or loose stools. 01/10/23   Danford, Earl Lites, MD  methocarbamol (ROBAXIN) 500 MG tablet Take 1 tablet (500 mg total) by mouth every 6 (six) hours as needed for muscle spasms. 01/10/23   Danford, Earl Lites, MD  metoprolol tartrate (LOPRESSOR) 50 MG tablet Take 1 tablet (50 mg total) by mouth 2 (two) times daily. 01/10/23   Danford, Earl Lites, MD  mirtazapine (REMERON) 15 MG tablet Take 15 mg by mouth at bedtime. 10/18/22   [provider]  nutrition supplement, JUVEN, (JUVEN) PACK Take 1 packet by mouth 2 (two) times daily between meals. 01/10/23   Danford, Earl Lites, MD  Nutritional Supplements (,FEEDING SUPPLEMENT, PROSOURCE PLUS) liquid Take 30 mLs by mouth 2 (two) times daily between meals. 01/10/23   Danford, Earl Lites, MD  Nutritional Supplements (FEEDING SUPPLEMENT, NEPRO CARB STEADY,) LIQD Take 237 mLs by mouth 2 (two) times daily between meals. 01/10/23   Danford, Earl Lites, MD  pantoprazole (PROTONIX) 40 MG tablet Take 40 mg by mouth 2 (two) times daily.    [provider]  zinc sulfate 220 (50 Zn) MG capsule Take 1 capsule (220 mg total) by mouth daily. 01/10/23   Danford, Earl Lites, MD      Allergies    Isovue [iopamidol], Neurontin [gabapentin], Nsaids, Penicillins, Reglan [metoclopramide], Valium [diazepam], Zestril [lisinopril], Tolectin [tolmetin], Asa [aspirin], Aspartame and phenylalanine, Bentyl [dicyclomine], Hibiclens [chlorhexidine gluconate], Flexeril [cyclobenzaprine], Oxycontin [oxycodone], Rifamycins, Tylenol [acetaminophen], and Ultram [tramadol]    Review of Systems   Review of Systems  Gastrointestinal:  Positive for abdominal pain.       Chronic, recurrent    Physical Exam Updated Vital Signs BP (!) 105/59   Pulse (!) 105   Temp 98.3 F  (36.8 C) (Oral)   Resp 15   Ht 1.676 m (5\' 6" )   LMP 10/10/2012   SpO2 100%   BMI 43.13 kg/m  Physical Exam Vitals and nursing note reviewed.  Constitutional:      General: She is not in acute distress.    Appearance: She is well-developed.  HENT:     Head: Normocephalic and atraumatic.     Right Ear: External ear normal.     Left Ear: External ear normal.  Eyes:     General: No scleral icterus.       Right eye: No discharge.        Left eye: No discharge.     Conjunctiva/sclera: Conjunctivae normal.  Neck:     Trachea: No tracheal deviation.  Cardiovascular:     Rate and Rhythm: Normal rate and regular rhythm.  Pulmonary:     Effort: Pulmonary  effort is normal. No respiratory distress.     Breath sounds: Normal breath sounds. No stridor. No wheezing or rales.  Abdominal:     General: Bowel sounds are normal. There is no distension.     Palpations: Abdomen is soft.     Tenderness: There is abdominal tenderness. There is no guarding or rebound.     Comments: Mild no rebound or guarding  Musculoskeletal:        General: Tenderness present. No deformity.     Cervical back: Neck supple.     Comments: Status post amputation right lower extremity, ulcerative wound noted left heel, no erythema no lymphangitic streaking  Skin:    General: Skin is warm and dry.     Findings: No rash.  Neurological:     General: No focal deficit present.     Mental Status: She is alert.     Cranial Nerves: No cranial nerve deficit, dysarthria or facial asymmetry.     Sensory: No sensory deficit.     Motor: No abnormal muscle tone or seizure activity.     Coordination: Coordination normal.  Psychiatric:        Mood and Affect: Mood normal.     ED Results / Procedures / Treatments   Labs (all labs ordered are listed, but only abnormal results are displayed) Labs Reviewed  CBC - Abnormal; Notable for the following components:      Result Value   WBC 16.7 (*)    RBC 2.69 (*)     Hemoglobin 7.6 (*)    HCT 25.3 (*)    All other components within normal limits  COMPREHENSIVE METABOLIC PANEL - Abnormal; Notable for the following components:   Sodium 134 (*)    Glucose, Bld 176 (*)    Creatinine, Ser 4.09 (*)    Calcium 8.3 (*)    Total Protein 6.3 (*)    Albumin 2.0 (*)    Total Bilirubin 1.2 (*)    GFR, Estimated 12 (*)    All other components within normal limits  CULTURE, BLOOD (ROUTINE X 2)  CULTURE, BLOOD (ROUTINE X 2)  I-STAT CG4 LACTIC ACID, ED  I-STAT CG4 LACTIC ACID, ED    EKG None  Radiology DG Foot 2 Views Left  Result Date: 01/21/2023 CLINICAL DATA:  Left foot wound EXAM: LEFT FOOT - 2 VIEW COMPARISON:  MRI from 12/19/2022 FINDINGS: Considerable soft tissue swelling of the foot is noted increased when compared with prior exam. Tarsal degenerative changes are seen. The known soft tissue wound posteriorly is not as well appreciated as on clinical exam. No definitive erosive changes are identified to suggest osteomyelitis. IMPRESSION: Generalized soft tissue swelling. No bony changes to suggest osteomyelitis are noted at this time. Electronically Signed   By: Alcide Clever M.D.   On: 01/21/2023 22:28    Procedures Procedures  {Document cardiac monitor, telemetry assessment procedure when appropriate:1}  Medications Ordered in ED Medications  ondansetron (ZOFRAN) 4 MG/2ML injection (has no administration in time range)  cefTRIAXone (ROCEPHIN) 1 g in sodium chloride 0.9 % 100 mL IVPB (1 g Intravenous New Bag/Given 01/21/23 2305)  morphine (PF) 4 MG/ML injection 4 mg (4 mg Intravenous Given 01/21/23 2139)  morphine (PF) 4 MG/ML injection 4 mg (4 mg Intravenous Given 01/21/23 2259)    ED Course/ Medical Decision Making/ A&P Clinical Course as of 01/21/23 2310  Fri Jan 21, 2023  2239 CBC(!) Blood cell count elevated but decreased compared to previous [JK]  2239 CBCMarland Kitchen)  Hemoglobin stable. [JK]  2239 Metabolic panel consistent with her chronic  kidney disease [JK]  2240 X-rays without signs of osteomyelitis.  Soft tissue swelling noted [JK]  2246 Notes patient has tolerated Rocephin in the past [JK]    Clinical Course User Index [JK] Linwood Dibbles, MD   {   Click here for ABCD2, HEART and other calculatorsREFRESH Note before signing :1}                              Medical Decision Making Amount and/or Complexity of Data Reviewed Labs: ordered. Decision-making details documented in ED Course.  Risk Prescription drug management.   Patient presented to the ER for evaluation of possible wound infection.  Notes reviewed from wound care nurse evaluation.  They were concerned about the possibility of drainage from her BKA as well as her left heel.  Patient without signs of systemic infection.  No fever here.  She does have leukocytosis but this is actually decreasing from previous values.  Lactic acid level was normal.  Patient noted to have some serous type drainage from her BKA wound but no purulent drainage on my exam.  I do not feel that hospitalization is necessary at this time.  Will give the patient a dose of antibiotics in the ED.  She tolerates cephalosporins.  Will discharge with antibiotics and recommend close outpatient follow-up.  {Document critical care time when appropriate:1} {Document review of labs and clinical decision tools ie heart score, Chads2Vasc2 etc:1}  {Document your independent review of radiology images, and any outside records:1} {Document your discussion with family members, caretakers, and with consultants:1} {Document social determinants of health affecting pt's care:1} {Document your decision making why or why not admission, treatments were needed:1} Final Clinical Impression(s) / ED Diagnoses Final diagnoses:  Skin ulcer of left heel, limited to breakdown of skin (HCC)  Wound drainage    Rx / DC Orders ED Discharge Orders          Ordered    cefdinir (OMNICEF) 300 MG capsule  Daily         01/21/23 2308

## 2023-01-21 NOTE — ED Notes (Signed)
ED Provider at bedside. 

## 2023-01-25 ENCOUNTER — Encounter: Payer: 59 | Admitting: Registered Nurse

## 2023-01-26 ENCOUNTER — Ambulatory Visit: Payer: 59 | Admitting: Internal Medicine

## 2023-01-26 ENCOUNTER — Ambulatory Visit (HOSPITAL_COMMUNITY): Payer: 59

## 2023-01-26 LAB — CULTURE, BLOOD (ROUTINE X 2)
Culture: NO GROWTH
Culture: NO GROWTH
Special Requests: ADEQUATE

## 2023-01-26 NOTE — Progress Notes (Deleted)
I,Stori Royse T Deloria Lair, CMA,acting as a Neurosurgeon for Gwynneth Aliment, MD.,have documented all relevant documentation on the behalf of Gwynneth Aliment, MD,as directed by  Gwynneth Aliment, MD while in the presence of Gwynneth Aliment, MD.  Subjective:  Patient ID: Deborah [name] , female    DOB: Nov 18, 1965 , 57 y.o.   MRN: [id number]  No chief complaint on file.   HPI  Patient presents today to establish care. Previous pcp:       Past Medical History:  Diagnosis Date   Acute back pain with sciatica, left    Acute back pain with sciatica, right    AKI (acute kidney injury) (HCC)    Anemia, unspecified    Cancer (HCC)    Carcinoid tumor of duodenum    Chest pain with normal coronary angiography 2019   Chronic kidney disease, stage 3b (HCC)    Chronic pain    Chronic systolic CHF (congestive heart failure) (HCC)    Diabetes mellitus    DKA (diabetic ketoacidosis) (HCC)    Drug-seeking behavior    21 hospitalizations and 14 CT a/p in 2 years for N/V and abdominal pain, demanding only IV dilaudid   Elevated troponin    chronic   Esophageal reflux    Fibromyalgia    Gastric ulcer    Gastroparesis    Gout    Hyperlipidemia    Hypertension    Hypokalemia    Hypomagnesemia    Lumbosacral stenosis    LVH (left ventricular hypertrophy)    Morbid obesity (HCC)    NICM (nonischemic cardiomyopathy) (HCC)    PAF (paroxysmal atrial fibrillation) (HCC)    Stroke (HCC) 02/2011   Thrombocytosis    Vitamin B12 deficiency anemia      Family History  Problem Relation Age of Onset   Diabetes Mother    Diabetes Father    Heart disease Father    Diabetes Sister    Congestive Heart Failure Sister 75   Diabetes Brother      Current Outpatient Medications:    albuterol (PROVENTIL) (2.5 MG/3ML) 0.083% nebulizer solution, Take 3 mLs (2.5 mg total) by nebulization every 6 (six) hours as needed for wheezing or shortness of breath., Disp: 150 mL, Rfl: 1   allopurinol (ZYLOPRIM) 100 MG  tablet, Take 1 tablet (100 mg total) by mouth 2 (two) times daily., Disp: 60 tablet, Rfl: 0   ascorbic acid (VITAMIN C) 1000 MG tablet, Take 1 tablet (1,000 mg total) by mouth daily., Disp: , Rfl:    atorvastatin (LIPITOR) 10 MG tablet, Take 10 mg by mouth daily., Disp: , Rfl:    busPIRone (BUSPAR) 5 MG tablet, Take 5 mg by mouth 2 (two) times daily., Disp: , Rfl:    cefdinir (OMNICEF) 300 MG capsule, Take 1 capsule (300 mg total) by mouth daily., Disp: 14 capsule, Rfl: 0   cetirizine (ZYRTEC) 10 MG tablet, Take 10 mg by mouth daily., Disp: , Rfl:    colchicine 0.6 MG tablet, Take 0.5 tablets (0.3 mg total) by mouth 2 (two) times a week., Disp: 4 tablet, Rfl: 2   doxercalciferol (HECTOROL) 4 MCG/2ML injection, Inject 2 mLs (4 mcg total) into the vein every Monday, Wednesday, and Friday with hemodialysis., Disp: 2 mL, Rfl: 0   EASY COMFORT PEN NEEDLES 31G X 5 MM MISC, USE 3 TIMES A DAY FOR INSULIN ADMINISTRATION, Disp: 100 each, Rfl: 3   famotidine (PEPCID) 20 MG tablet, Take 20 mg by mouth daily., Disp: ,  Rfl:    ferric citrate (AURYXIA) 1 GM 210 MG(Fe) tablet, Take 2 tablets (420 mg total) by mouth 3 (three) times daily with meals. (Patient taking differently: Take 210 mg by mouth 3 (three) times daily with meals.), Disp: 270 tablet, Rfl: 1   fluticasone (FLONASE) 50 MCG/ACT nasal spray, Place 2 sprays into both nostrils daily as needed for allergies or rhinitis., Disp: 16 g, Rfl: 2   folic acid (FOLVITE) 1 MG tablet, Take 1 tablet (1 mg total) by mouth daily., Disp: , Rfl:    gabapentin (NEURONTIN) 100 MG capsule, Take 1 capsule (100 mg total) by mouth 3 (three) times daily., Disp: 90 capsule, Rfl: 0   HYDROmorphone (DILAUDID) 4 MG tablet, Take 1 tablet (4 mg total) by mouth in the morning, at noon, in the evening, and at bedtime., Disp: 20 tablet, Rfl: 0   hydrOXYzine (ATARAX) 25 MG tablet, Take 1 tablet (25 mg total) by mouth 3 (three) times daily as needed for anxiety., Disp: , Rfl:    insulin  glargine-yfgn (SEMGLEE) 100 UNIT/ML injection, Inject 0.08 mLs (8 Units total) into the skin at bedtime., Disp: , Rfl:    insulin lispro (HUMALOG) 100 UNIT/ML KwikPen, Before each meal 3 times a day, 140-199 - 2 units, 200-250 - 6 units, 251-299 - 8 units,  300-349 - 12 units,  350 or above 14 units. (Patient taking differently: Inject 2-14 Units into the skin See admin instructions. Before each meal and at bedtime. 140-199 - 2 units, 200-250 - 6 units, 251-299 - 8 units,  300-349 - 12 units,  350 or above 14 units.), Disp: 15 mL, Rfl: 0   loperamide (IMODIUM) 2 MG capsule, Take 1 capsule (2 mg total) by mouth as needed for diarrhea or loose stools., Disp: , Rfl:    methocarbamol (ROBAXIN) 500 MG tablet, Take 1 tablet (500 mg total) by mouth every 6 (six) hours as needed for muscle spasms., Disp: , Rfl:    metoprolol tartrate (LOPRESSOR) 50 MG tablet, Take 1 tablet (50 mg total) by mouth 2 (two) times daily., Disp: , Rfl:    mirtazapine (REMERON) 15 MG tablet, Take 15 mg by mouth at bedtime., Disp: , Rfl:    nutrition supplement, JUVEN, (JUVEN) PACK, Take 1 packet by mouth 2 (two) times daily between meals., Disp: , Rfl:    Nutritional Supplements (,FEEDING SUPPLEMENT, PROSOURCE PLUS) liquid, Take 30 mLs by mouth 2 (two) times daily between meals., Disp: , Rfl:    Nutritional Supplements (FEEDING SUPPLEMENT, NEPRO CARB STEADY,) LIQD, Take 237 mLs by mouth 2 (two) times daily between meals., Disp: , Rfl:    pantoprazole (PROTONIX) 40 MG tablet, Take 40 mg by mouth 2 (two) times daily., Disp: , Rfl:    zinc sulfate 220 (50 Zn) MG capsule, Take 1 capsule (220 mg total) by mouth daily., Disp: , Rfl:    Allergies  Allergen Reactions   Isovue [Iopamidol] Anaphylaxis, [name] and Other (See Comments)    11/28/17 Patient had seizure like activity and then 1 min code after 100 cc of isovue 300. Possible contrast allergy vs vasovagal episode  Cardiac Arrest   Neurontin [Gabapentin] Shortness Of  Breath and Swelling   Nsaids Anaphylaxis and Other (See Comments)    Hx of stomach ulcers   Penicillins Itching, Palpitations and Other (See Comments)    Flushing (Red Skin) Laryngeal Edema   Reglan [Metoclopramide] Other (See Comments)    Tardive dyskinesia    Valium [Diazepam] [name]  Zestril [Lisinopril] Anaphylaxis and Swelling    Tongue and mouth swelling Laryngeal Edema   Tolectin [Tolmetin] Nausea And Vomiting, Nausea Only and Other (See Comments)    Irritates stomach ulcer   Asa [Aspirin] Other (See Comments)    Hx of stomach ulcer   Aspartame And Phenylalanine Hives   Bentyl [Dicyclomine] Other (See Comments)    Chest pain   Hibiclens [Chlorhexidine Gluconate] Other (See Comments)    Dermatitis    Flexeril [Cyclobenzaprine] Palpitations   Oxycontin [Oxycodone] Palpitations   Rifamycins Palpitations   Tylenol [Acetaminophen] Nausea And Vomiting, Nausea Only and Other (See Comments)    Irritates stomach ulcer Abdominal pain   Ultram [Tramadol] Nausea And Vomiting and Palpitations     Review of Systems  Constitutional: Negative.   Respiratory: Negative.    Cardiovascular: Negative.   Neurological: Negative.   Psychiatric/Behavioral: Negative.       There were no vitals filed for this visit. There is no height or weight on file to calculate BMI.  Wt Readings from Last 3 Encounters:  01/08/23 267 lb 3.2 oz (121.2 kg)  11/28/22 281 lb 1.4 oz (127.5 kg)  10/19/22 293 lb (132.9 kg)     Objective:  Physical Exam      Assessment And Plan:  Encounter to establish care     No follow-ups on file.  Patient was given opportunity to ask questions. Patient verbalized understanding of the plan and was able to repeat key elements of the plan. All questions were answered to their satisfaction.  Gwynneth Aliment, MD  I, Gwynneth Aliment, MD, have reviewed all documentation for this visit. The documentation on 01/26/23 for the exam, diagnosis, procedures,  and orders are all accurate and complete.   IF YOU HAVE BEEN REFERRED TO A SPECIALIST, IT MAY TAKE 1-2 WEEKS TO SCHEDULE/PROCESS THE REFERRAL. IF YOU HAVE NOT HEARD FROM US/SPECIALIST IN TWO WEEKS, PLEASE GIVE [place] A CALL AT [phone].   THE PATIENT IS ENCOURAGED TO PRACTICE SOCIAL DISTANCING DUE TO THE COVID-19 PANDEMIC.

## 2023-01-27 ENCOUNTER — Emergency Department (HOSPITAL_COMMUNITY)
Admission: EM | Admit: 2023-01-27 | Discharge: 2023-01-27 | Disposition: A | Discharge status: Home / Self Care | Payer: 59 | Source: Home / Self Care | Attending: Emergency Medicine | Admitting: Emergency Medicine

## 2023-01-27 ENCOUNTER — Emergency Department (HOSPITAL_COMMUNITY): Payer: 59

## 2023-01-27 ENCOUNTER — Encounter (HOSPITAL_COMMUNITY): Payer: Self-pay | Admitting: Emergency Medicine

## 2023-01-27 ENCOUNTER — Ambulatory Visit: Payer: 59 | Admitting: Orthopedic Surgery

## 2023-01-27 DIAGNOSIS — I480 Paroxysmal atrial fibrillation: Secondary | ICD-10-CM | POA: Insufficient documentation

## 2023-01-27 DIAGNOSIS — N186 End stage renal disease: Secondary | ICD-10-CM | POA: Insufficient documentation

## 2023-01-27 DIAGNOSIS — Z992 Dependence on renal dialysis: Secondary | ICD-10-CM | POA: Insufficient documentation

## 2023-01-27 DIAGNOSIS — Z794 Long term (current) use of insulin: Secondary | ICD-10-CM | POA: Insufficient documentation

## 2023-01-27 DIAGNOSIS — Z7901 Long term (current) use of anticoagulants: Secondary | ICD-10-CM | POA: Insufficient documentation

## 2023-01-27 DIAGNOSIS — I120 Hypertensive chronic kidney disease with stage 5 chronic kidney disease or end stage renal disease: Secondary | ICD-10-CM | POA: Insufficient documentation

## 2023-01-27 DIAGNOSIS — R112 Nausea with vomiting, unspecified: Secondary | ICD-10-CM | POA: Insufficient documentation

## 2023-01-27 DIAGNOSIS — T8781 Dehiscence of amputation stump: Secondary | ICD-10-CM | POA: Diagnosis not present

## 2023-01-27 DIAGNOSIS — E1122 Type 2 diabetes mellitus with diabetic chronic kidney disease: Secondary | ICD-10-CM | POA: Insufficient documentation

## 2023-01-27 DIAGNOSIS — R1084 Generalized abdominal pain: Secondary | ICD-10-CM | POA: Insufficient documentation

## 2023-01-27 DIAGNOSIS — Z79899 Other long term (current) drug therapy: Secondary | ICD-10-CM | POA: Insufficient documentation

## 2023-01-27 LAB — CBC WITH DIFFERENTIAL/PLATELET
Abs Immature Granulocytes: 0.09 10*3/uL — ABNORMAL HIGH (ref 0.00–0.07)
Basophils Absolute: 0 10*3/uL (ref 0.0–0.1)
Basophils Relative: 0 %
Eosinophils Absolute: 0.3 10*3/uL (ref 0.0–0.5)
Eosinophils Relative: 2 %
HCT: 23.7 % — ABNORMAL LOW (ref 36.0–46.0)
Hemoglobin: 7.3 g/dL — ABNORMAL LOW (ref 12.0–15.0)
Immature Granulocytes: 1 %
Lymphocytes Relative: 14 %
Lymphs Abs: 2.1 10*3/uL (ref 0.7–4.0)
MCH: 29.1 pg (ref 26.0–34.0)
MCHC: 30.8 g/dL (ref 30.0–36.0)
MCV: 94.4 fL (ref 80.0–100.0)
Monocytes Absolute: 0.7 10*3/uL (ref 0.1–1.0)
Monocytes Relative: 4 %
Neutro Abs: 12.1 10*3/uL — ABNORMAL HIGH (ref 1.7–7.7)
Neutrophils Relative %: 79 %
Platelets: 403 10*3/uL — ABNORMAL HIGH (ref 150–400)
RBC: 2.51 MIL/uL — ABNORMAL LOW (ref 3.87–5.11)
RDW: 14.9 % (ref 11.5–15.5)
WBC: 15.3 10*3/uL — ABNORMAL HIGH (ref 4.0–10.5)
nRBC: 0 % (ref 0.0–0.2)

## 2023-01-27 LAB — COMPREHENSIVE METABOLIC PANEL
ALT: 10 U/L (ref 0–44)
AST: 17 U/L (ref 15–41)
Albumin: 2 g/dL — ABNORMAL LOW (ref 3.5–5.0)
Alkaline Phosphatase: 89 U/L (ref 38–126)
Anion gap: 12 (ref 5–15)
BUN: 13 mg/dL (ref 6–20)
CO2: 24 mmol/L (ref 22–32)
Calcium: 8.3 mg/dL — ABNORMAL LOW (ref 8.9–10.3)
Chloride: 102 mmol/L (ref 98–111)
Creatinine, Ser: 5.39 mg/dL — ABNORMAL HIGH (ref 0.44–1.00)
GFR, Estimated: 9 mL/min — ABNORMAL LOW (ref >60)
Glucose, Bld: 123 mg/dL — ABNORMAL HIGH (ref 70–99)
Potassium: 3.1 mmol/L — ABNORMAL LOW (ref 3.5–5.1)
Sodium: 138 mmol/L (ref 135–145)
Total Bilirubin: 0.8 mg/dL (ref <1.2)
Total Protein: 6.6 g/dL (ref 6.5–8.1)

## 2023-01-27 LAB — LIPASE, BLOOD: Lipase: 21 U/L (ref 11–51)

## 2023-01-27 MED ORDER — HYDROMORPHONE HCL 1 MG/ML IJ SOLN
1.0000 mg | Freq: Once | INTRAMUSCULAR | Status: AC
  Administered 2023-01-27: 1 mg via INTRAVENOUS
  Filled 2023-01-27: qty 1

## 2023-01-27 MED ORDER — ONDANSETRON HCL 4 MG/2ML IJ SOLN
4.0000 mg | Freq: Once | INTRAMUSCULAR | Status: AC
  Administered 2023-01-27: 4 mg via INTRAVENOUS
  Filled 2023-01-27: qty 2

## 2023-01-27 NOTE — ED Notes (Signed)
Two attempts with [place] IV by ED RN, waiting on IV team for access

## 2023-01-27 NOTE — ED Provider Notes (Signed)
[place] EMERGENCY DEPARTMENT AT [facility] Provider Note   [name]: [id number] Arrival date & time: 01/27/23  1224     History  Chief Complaint  Patient presents with   Abdominal Pain    Deborah [name] is a 57 y.o. female with past medical history significant for hypertension, diabetes, diabetic gastroparesis, gastric ulcer, obesity, recurrent abdominal pain, paroxysmal Afib on Eliqiuis, ESRD on dialysis, HFpEF, chronic ulcer of the heel, osteomyelitis presents to the ED from skilled nursing facility complaining of abdominal pain that began today and missed dialysis.  Patient was to be going to her home tomorrow.  Patient is on 4 mg Dilaudid daily, but recently had a decrease in dosages from 5 times per day to 3 times per day.  Patient reports the PO Dilaudid has not been working.  She states her abdominal pain is sharp and different from previous pain she has had.  Endorses nausea and vomiting.  Denies fever, chills, constipation, diarrhea.       Home Medications Prior to Admission medications   Medication Sig Start Date End Date Taking? Authorizing Provider  albuterol (PROVENTIL) (2.5 MG/3ML) 0.083% nebulizer solution Take 3 mLs (2.5 mg total) by nebulization every 6 (six) hours as needed for wheezing or shortness of breath. 04/06/19   Bing Neighbors, NP  allopurinol (ZYLOPRIM) 100 MG tablet Take 1 tablet (100 mg total) by mouth 2 (two) times daily. 11/30/22   Azucena Fallen, MD  ascorbic acid (VITAMIN C) 1000 MG tablet Take 1 tablet (1,000 mg total) by mouth daily. 01/10/23   Danford, Earl Lites, MD  atorvastatin (LIPITOR) 10 MG tablet Take 10 mg by mouth daily. 12/03/21   [provider]  busPIRone (BUSPAR) 5 MG tablet Take 5 mg by mouth 2 (two) times daily.    [provider]  cefdinir (OMNICEF) 300 MG capsule Take 1 capsule (300 mg total) by mouth daily. 01/21/23   Linwood Dibbles, MD  cetirizine (ZYRTEC) 10 MG tablet Take 10 mg by mouth  daily. 08/05/22   [provider]  colchicine 0.6 MG tablet Take 0.5 tablets (0.3 mg total) by mouth 2 (two) times a week. 12/02/22   Azucena Fallen, MD  doxercalciferol (HECTOROL) 4 MCG/2ML injection Inject 2 mLs (4 mcg total) into the vein every Monday, Wednesday, and Friday with hemodialysis. 11/30/22   Azucena Fallen, MD  EASY COMFORT PEN NEEDLES 31G X 5 MM MISC USE 3 TIMES A DAY FOR INSULIN ADMINISTRATION 11/14/19   Meccariello, Solmon Ice, MD  famotidine (PEPCID) 20 MG tablet Take 20 mg by mouth daily.    [provider]  ferric citrate (AURYXIA) 1 GM 210 MG(Fe) tablet Take 2 tablets (420 mg total) by mouth 3 (three) times daily with meals. Patient taking differently: Take 210 mg by mouth 3 (three) times daily with meals. 11/30/22   Azucena Fallen, MD  fluticasone [facility]) 50 MCG/ACT nasal spray Place 2 sprays into both nostrils daily as needed for allergies or rhinitis. 12/19/18   Rai, Delene Ruffini, MD  folic acid (FOLVITE) 1 MG tablet Take 1 tablet (1 mg total) by mouth daily. 01/10/23   Danford, Earl Lites, MD  gabapentin (NEURONTIN) 100 MG capsule Take 1 capsule (100 mg total) by mouth 3 (three) times daily. 11/30/22   Azucena Fallen, MD  HYDROmorphone (DILAUDID) 4 MG tablet Take 1 tablet (4 mg total) by mouth in the morning, at noon, in the evening, and at bedtime. 01/10/23   [name]  P, MD  hydrOXYzine (ATARAX) 25 MG tablet Take 1 tablet (25 mg total) by mouth 3 (three) times daily as needed for anxiety. 01/10/23   Danford, Earl Lites, MD  insulin glargine-yfgn (SEMGLEE) 100 UNIT/ML injection Inject 0.08 mLs (8 Units total) into the skin at bedtime. 01/10/23   Danford, Earl Lites, MD  insulin lispro (HUMALOG) 100 UNIT/ML KwikPen Before each meal 3 times a day, 140-199 - 2 units, 200-250 - 6 units, 251-299 - 8 units,  300-349 - 12 units,  350 or above 14 units. Patient taking differently: Inject 2-14 Units into the skin See admin instructions.  Before each meal and at bedtime. 140-199 - 2 units, 200-250 - 6 units, 251-299 - 8 units,  300-349 - 12 units,  350 or above 14 units. 06/04/22   Leroy Sea, MD  loperamide (IMODIUM) 2 MG capsule Take 1 capsule (2 mg total) by mouth as needed for diarrhea or loose stools. 01/10/23   Danford, Earl Lites, MD  methocarbamol (ROBAXIN) 500 MG tablet Take 1 tablet (500 mg total) by mouth every 6 (six) hours as needed for muscle spasms. 01/10/23   Danford, Earl Lites, MD  metoprolol tartrate (LOPRESSOR) 50 MG tablet Take 1 tablet (50 mg total) by mouth 2 (two) times daily. 01/10/23   Danford, Earl Lites, MD  mirtazapine (REMERON) 15 MG tablet Take 15 mg by mouth at bedtime. 10/18/22   [provider]  nutrition supplement, JUVEN, (JUVEN) PACK Take 1 packet by mouth 2 (two) times daily between meals. 01/10/23   Danford, Earl Lites, MD  Nutritional Supplements (,FEEDING SUPPLEMENT, PROSOURCE PLUS) liquid Take 30 mLs by mouth 2 (two) times daily between meals. 01/10/23   Danford, Earl Lites, MD  Nutritional Supplements (FEEDING SUPPLEMENT, NEPRO CARB STEADY,) LIQD Take 237 mLs by mouth 2 (two) times daily between meals. 01/10/23   Danford, Earl Lites, MD  pantoprazole (PROTONIX) 40 MG tablet Take 40 mg by mouth 2 (two) times daily.    [provider]  zinc sulfate 220 (50 Zn) MG capsule Take 1 capsule (220 mg total) by mouth daily. 01/10/23   Danford, Earl Lites, MD      Allergies    Isovue [iopamidol], Neurontin [gabapentin], Nsaids, Penicillins, Reglan [metoclopramide], Valium [diazepam], Zestril [lisinopril], Tolectin [tolmetin], Asa [aspirin], Aspartame and phenylalanine, Bentyl [dicyclomine], Hibiclens [chlorhexidine gluconate], Flexeril [cyclobenzaprine], Oxycontin [oxycodone], Rifamycins, Tylenol [acetaminophen], and Ultram [tramadol]    Review of Systems   Review of Systems  Constitutional:  Negative for chills and fever.  Gastrointestinal:  Positive for abdominal  pain, nausea and vomiting. Negative for constipation and diarrhea.    Physical Exam Updated Vital Signs BP 122/61   Pulse 83   Temp (!) 97.4 F (36.3 C) (Oral)   Resp 16   LMP 10/10/2012   SpO2 100%  Physical Exam Vitals and nursing note reviewed.  Constitutional:      General: She is in acute distress.     Appearance: Normal appearance. She is ill-appearing. She is not diaphoretic.     Comments: Patient is writhing around in bed screaming "please help me!"  Cardiovascular:     Rate and Rhythm: Normal rate and regular rhythm.  Pulmonary:     Effort: Pulmonary effort is normal.  Abdominal:     General: Abdomen is protuberant.     Palpations: Abdomen is soft.     Tenderness: There is generalized abdominal tenderness. There is no guarding.     Comments: Patient screams with light palpation of the abdomen,  but it is soft without guarding.   Skin:    General: Skin is warm and dry.     Capillary Refill: Capillary refill takes less than 2 seconds.  Neurological:     Mental Status: She is alert. Mental status is at baseline.  Psychiatric:        Mood and Affect: Mood normal.        Behavior: Behavior normal.     ED Results / Procedures / Treatments   Labs (all labs ordered are listed, but only abnormal results are displayed) Labs Reviewed  COMPREHENSIVE METABOLIC PANEL - Abnormal; Notable for the following components:      Result Value   Potassium 3.1 (*)    Glucose, Bld 123 (*)    Creatinine, Ser 5.39 (*)    Calcium 8.3 (*)    Albumin 2.0 (*)    GFR, Estimated 9 (*)    All other components within normal limits  CBC WITH DIFFERENTIAL/PLATELET - Abnormal; Notable for the following components:   WBC 15.3 (*)    RBC 2.51 (*)    Hemoglobin 7.3 (*)    HCT 23.7 (*)    Platelets 403 (*)    Neutro Abs 12.1 (*)    Abs Immature Granulocytes 0.09 (*)    All other components within normal limits  LIPASE, BLOOD    EKG EKG Interpretation Date/Time:  Thursday January 27 2023 14:17:23 EST Ventricular Rate:  82 PR Interval:  205 QRS Duration:  91 QT Interval:  423 QTC Calculation: 495 R Axis:   -33  Text Interpretation: Sinus rhythm Multiple premature complexes, vent & supraven Borderline prolonged PR interval Left axis deviation Low voltage, precordial leads Borderline prolonged QT interval No significant change since prior 10/24 Confirmed by Meridee Score [phone]) on 01/27/2023 2:22:58 PM  Radiology CT ABDOMEN PELVIS WO CONTRAST  Result Date: 01/27/2023 CLINICAL DATA:  Acute generalized abdominal pain. EXAM: CT ABDOMEN AND PELVIS WITHOUT CONTRAST TECHNIQUE: Multidetector CT imaging of the abdomen and pelvis was performed following the standard protocol without IV contrast. RADIATION DOSE REDUCTION: This exam was performed according to the departmental dose-optimization program which includes automated exposure control, adjustment of the mA and/or kV according to patient size and/or use of iterative reconstruction technique. COMPARISON:  December 14, 2022. FINDINGS: Lower chest: No acute abnormality. Hepatobiliary: No focal liver abnormality is seen. Status post cholecystectomy. No biliary dilatation. Pancreas: Unremarkable. No pancreatic ductal dilatation or surrounding inflammatory changes. Spleen: Normal in size without focal abnormality. Adrenals/Urinary Tract: Adrenal glands are unremarkable. Kidneys are normal, without renal calculi, focal lesion, or hydronephrosis. Bladder is unremarkable. Stomach/Bowel: Stomach is within normal limits. Appendix appears normal. No evidence of bowel wall thickening, distention, or inflammatory changes. Vascular/Lymphatic: No significant vascular findings are present. No enlarged abdominal or pelvic lymph nodes. Reproductive: Stable uterine fibroid is noted. No definite adnexal abnormality is noted. Other: No abdominal wall hernia or abnormality. No abdominopelvic ascites. Musculoskeletal: No acute or significant osseous findings.  IMPRESSION: No acute abnormality seen in the abdomen or pelvis. Electronically Signed   By: Lupita Raider M.D.   On: 01/27/2023 15:48    Procedures Procedures    Medications Ordered in ED Medications  HYDROmorphone (DILAUDID) injection 1 mg (1 mg Intravenous Given 01/27/23 1350)  ondansetron (ZOFRAN) injection 4 mg (4 mg Intravenous Given 01/27/23 1350)    ED Course/ Medical Decision Making/ A&P  Medical Decision Making Amount and/or Complexity of Data Reviewed Labs: ordered. Radiology: ordered.  Risk Prescription drug management.   This patient presents to the ED with chief complaint(s) of abdominal pain, nausea, vomiting with pertinent past medical history of ESRD on HD, GERD, gastric ulcer, diabetes, gastroparesis.  The complaint involves an extensive differential diagnosis and also carries with it a high risk of complications and morbidity.    The differential diagnosis includes pancreatitis, gastritis, gastroparesis, metabolic derangement, bowel obstruction, ulcer perforation; colitis and diverticulitis less likely as patient is not experiencing diarrhea  The initial plan is to obtain labs, give IV meds, and obtain CT   Additional history obtained: Records reviewed Nursing Home Documents; previous admission documents  Initial Assessment:   On exam, patient is writhing in bed and screaming "please help me".  Abdomen is protuberant and soft.  Generalized tenderness to abdomen.  No guarding.  Skin is warm and dry.  No overlying skin changes.  Patient not actively vomiting.  She is mildly tachypneic, but vitals are otherwise within normal.  Patient has a right sided BKA.  Her left foot is wrapped and in a boot due to her chronic foot ulcer.  Independent ECG/labs interpretation:  The following labs were independently interpreted:  CBC with leukocytosis, but appears to be down trending from prior.  She is anemic, but appears chronic.     Independent visualization and interpretation of imaging: I independently visualized the following imaging with scope of interpretation limited to determining acute life threatening conditions related to emergency care: CT abdomen/pelvis without contrast, which revealed no acute intra-abdominal or pelvic process to explain patient's symptoms.    Treatment and Reassessment: Patient given IV Dilaudid and Zofran with improvement in symptoms.  Will give patient a PO challenge and reassess.    Upon reassessment, patient is lying in bed comfortably and is not in distress.  She continues to complain of some abdominal pain.  Will give 1 more dose of IV Dilaudid prior to discharge.   Disposition:   Suspect patient's abdominal pain is related to her chronic abdominal problems.  Workup is overall reassuring.  She had improvement in symptoms with pain and nausea medications.  She is prescribed Dilaudid to take at home for chronic abdominal pain.  Advised patient to take medications as prescribed and follow up with PCP and/or GI if symptoms persist.   Patient was set to go home tomorrow from [facility].  Patient states she was told she could go straight home from the ED.  Confirmed this with facility.  Patient will be discharged home.  [facility] will be arranged for transport.   The patient has been appropriately medically screened and/or stabilized in the ED. I have low suspicion for any other emergent medical condition which would require further screening, evaluation or treatment in the ED or require inpatient management. At time of discharge the patient is hemodynamically stable and in no acute distress. I have discussed work-up results and diagnosis with patient and answered all questions. Patient is agreeable with discharge plan. We discussed strict return precautions for returning to the emergency department and they verbalized understanding.              Final Clinical Impression(s) / ED  Diagnoses Final diagnoses:  Generalized abdominal pain  Nausea and vomiting, unspecified vomiting type    Rx / DC Orders ED Discharge Orders     None         Lenard Simmer, PA-C 01/27/23 1903  Terrilee Files, MD 01/27/23 2055

## 2023-01-27 NOTE — Discharge Instructions (Addendum)
Thank you for allowing [place] to be a part of your care today.  Your workup is overall reassuring and did not show evidence of an emergent or acute finding to explain your abdominal pain.  Your blood work is also stable.   Take your medications as prescribed.  If you continue to have abdominal discomfort, I recommend contacting your primary care doctor and/or your gastroenterologist.   Return to the ED if you develop sudden worsening of your symptoms or if you have new concerns.

## 2023-01-27 NOTE — ED Notes (Signed)
Pt transported to CT ?

## 2023-01-27 NOTE — ED Triage Notes (Signed)
Pt here from Nursing home but is suppose to go to her regular home tomorrow , here today with c/o abd and missed dialysis , pt is on dilaudid daily but has a decreased from 5 to 3 times daily

## 2023-01-27 NOTE — ED Notes (Signed)
[facility] called no ETA

## 2023-01-27 NOTE — ED Notes (Signed)
Spoke with facility representative, [name] regarding discharge. Deborah [name] states this patient is to be discharged home. MD updated on situation.

## 2023-01-27 NOTE — ED Notes (Signed)
Pt from [facility] facility and EMS mentioned she would be leaving facility to go home tomorrow

## 2023-01-29 ENCOUNTER — Inpatient Hospital Stay (HOSPITAL_COMMUNITY)
Admission: EM | Admit: 2023-01-29 | Discharge: 2023-02-15 | DRG: 463 | Disposition: A | Discharge status: Home Health | Payer: 59 | Attending: Internal Medicine | Admitting: Internal Medicine

## 2023-01-29 ENCOUNTER — Encounter (HOSPITAL_COMMUNITY): Payer: Self-pay

## 2023-01-29 ENCOUNTER — Other Ambulatory Visit: Payer: Self-pay

## 2023-01-29 DIAGNOSIS — M869 Osteomyelitis, unspecified: Secondary | ICD-10-CM | POA: Diagnosis present

## 2023-01-29 DIAGNOSIS — Z765 Malingerer [conscious simulation]: Secondary | ICD-10-CM

## 2023-01-29 DIAGNOSIS — Z992 Dependence on renal dialysis: Secondary | ICD-10-CM

## 2023-01-29 DIAGNOSIS — Z85060 Personal history of malignant carcinoid tumor of small intestine: Secondary | ICD-10-CM

## 2023-01-29 DIAGNOSIS — N186 End stage renal disease: Secondary | ICD-10-CM | POA: Diagnosis present

## 2023-01-29 DIAGNOSIS — T8130XA Disruption of wound, unspecified, initial encounter: Secondary | ICD-10-CM

## 2023-01-29 DIAGNOSIS — Z91158 Patient's noncompliance with renal dialysis for other reason: Secondary | ICD-10-CM

## 2023-01-29 DIAGNOSIS — Z6841 Body Mass Index (BMI) 40.0 and over, adult: Secondary | ICD-10-CM

## 2023-01-29 DIAGNOSIS — E876 Hypokalemia: Secondary | ICD-10-CM | POA: Diagnosis present

## 2023-01-29 DIAGNOSIS — K760 Fatty (change of) liver, not elsewhere classified: Secondary | ICD-10-CM | POA: Diagnosis present

## 2023-01-29 DIAGNOSIS — Z79891 Long term (current) use of opiate analgesic: Secondary | ICD-10-CM

## 2023-01-29 DIAGNOSIS — I480 Paroxysmal atrial fibrillation: Secondary | ICD-10-CM | POA: Diagnosis present

## 2023-01-29 DIAGNOSIS — T148XXA Other injury of unspecified body region, initial encounter: Secondary | ICD-10-CM | POA: Diagnosis not present

## 2023-01-29 DIAGNOSIS — E66813 Obesity, class 3: Secondary | ICD-10-CM | POA: Diagnosis present

## 2023-01-29 DIAGNOSIS — E1143 Type 2 diabetes mellitus with diabetic autonomic (poly)neuropathy: Secondary | ICD-10-CM | POA: Diagnosis present

## 2023-01-29 DIAGNOSIS — Z91041 Radiographic dye allergy status: Secondary | ICD-10-CM

## 2023-01-29 DIAGNOSIS — T8743 Infection of amputation stump, right lower extremity: Secondary | ICD-10-CM | POA: Diagnosis present

## 2023-01-29 DIAGNOSIS — E785 Hyperlipidemia, unspecified: Secondary | ICD-10-CM | POA: Diagnosis present

## 2023-01-29 DIAGNOSIS — I428 Other cardiomyopathies: Secondary | ICD-10-CM | POA: Diagnosis present

## 2023-01-29 DIAGNOSIS — Z880 Allergy status to penicillin: Secondary | ICD-10-CM

## 2023-01-29 DIAGNOSIS — G546 Phantom limb syndrome with pain: Secondary | ICD-10-CM | POA: Diagnosis present

## 2023-01-29 DIAGNOSIS — L089 Local infection of the skin and subcutaneous tissue, unspecified: Secondary | ICD-10-CM

## 2023-01-29 DIAGNOSIS — D631 Anemia in chronic kidney disease: Secondary | ICD-10-CM | POA: Diagnosis present

## 2023-01-29 DIAGNOSIS — Z89511 Acquired absence of right leg below knee: Secondary | ICD-10-CM | POA: Diagnosis not present

## 2023-01-29 DIAGNOSIS — L97429 Non-pressure chronic ulcer of left heel and midfoot with unspecified severity: Secondary | ICD-10-CM | POA: Diagnosis present

## 2023-01-29 DIAGNOSIS — A419 Sepsis, unspecified organism: Secondary | ICD-10-CM | POA: Diagnosis present

## 2023-01-29 DIAGNOSIS — Z1152 Encounter for screening for COVID-19: Secondary | ICD-10-CM

## 2023-01-29 DIAGNOSIS — L03116 Cellulitis of left lower limb: Secondary | ICD-10-CM | POA: Diagnosis present

## 2023-01-29 DIAGNOSIS — Z8674 Personal history of sudden cardiac arrest: Secondary | ICD-10-CM

## 2023-01-29 DIAGNOSIS — Z79899 Other long term (current) drug therapy: Secondary | ICD-10-CM

## 2023-01-29 DIAGNOSIS — L97529 Non-pressure chronic ulcer of other part of left foot with unspecified severity: Secondary | ICD-10-CM | POA: Diagnosis present

## 2023-01-29 DIAGNOSIS — Z8249 Family history of ischemic heart disease and other diseases of the circulatory system: Secondary | ICD-10-CM

## 2023-01-29 DIAGNOSIS — K3184 Gastroparesis: Secondary | ICD-10-CM | POA: Diagnosis present

## 2023-01-29 DIAGNOSIS — T8781 Dehiscence of amputation stump: Secondary | ICD-10-CM | POA: Diagnosis present

## 2023-01-29 DIAGNOSIS — Z794 Long term (current) use of insulin: Secondary | ICD-10-CM | POA: Diagnosis not present

## 2023-01-29 DIAGNOSIS — E1122 Type 2 diabetes mellitus with diabetic chronic kidney disease: Secondary | ICD-10-CM | POA: Diagnosis present

## 2023-01-29 DIAGNOSIS — Z885 Allergy status to narcotic agent status: Secondary | ICD-10-CM

## 2023-01-29 DIAGNOSIS — E11621 Type 2 diabetes mellitus with foot ulcer: Secondary | ICD-10-CM | POA: Diagnosis present

## 2023-01-29 DIAGNOSIS — I5032 Chronic diastolic (congestive) heart failure: Secondary | ICD-10-CM | POA: Diagnosis present

## 2023-01-29 DIAGNOSIS — Z888 Allergy status to other drugs, medicaments and biological substances status: Secondary | ICD-10-CM

## 2023-01-29 DIAGNOSIS — Z5982 Transportation insecurity: Secondary | ICD-10-CM

## 2023-01-29 DIAGNOSIS — Z5189 Encounter for other specified aftercare: Secondary | ICD-10-CM | POA: Diagnosis not present

## 2023-01-29 DIAGNOSIS — Z833 Family history of diabetes mellitus: Secondary | ICD-10-CM

## 2023-01-29 DIAGNOSIS — Z91199 Patient's noncompliance with other medical treatment and regimen due to unspecified reason: Secondary | ICD-10-CM

## 2023-01-29 DIAGNOSIS — I132 Hypertensive heart and chronic kidney disease with heart failure and with stage 5 chronic kidney disease, or end stage renal disease: Secondary | ICD-10-CM | POA: Diagnosis present

## 2023-01-29 DIAGNOSIS — F410 Panic disorder [episodic paroxysmal anxiety] without agoraphobia: Secondary | ICD-10-CM | POA: Diagnosis present

## 2023-01-29 DIAGNOSIS — F32A Depression, unspecified: Secondary | ICD-10-CM | POA: Diagnosis present

## 2023-01-29 DIAGNOSIS — Z8711 Personal history of peptic ulcer disease: Secondary | ICD-10-CM

## 2023-01-29 DIAGNOSIS — G894 Chronic pain syndrome: Secondary | ICD-10-CM | POA: Diagnosis present

## 2023-01-29 DIAGNOSIS — Z881 Allergy status to other antibiotic agents status: Secondary | ICD-10-CM

## 2023-01-29 DIAGNOSIS — E1152 Type 2 diabetes mellitus with diabetic peripheral angiopathy with gangrene: Secondary | ICD-10-CM | POA: Diagnosis present

## 2023-01-29 DIAGNOSIS — Z8673 Personal history of transient ischemic attack (TIA), and cerebral infarction without residual deficits: Secondary | ICD-10-CM

## 2023-01-29 DIAGNOSIS — K219 Gastro-esophageal reflux disease without esophagitis: Secondary | ICD-10-CM | POA: Diagnosis present

## 2023-01-29 DIAGNOSIS — Z7189 Other specified counseling: Secondary | ICD-10-CM | POA: Diagnosis not present

## 2023-01-29 DIAGNOSIS — Y835 Amputation of limb(s) as the cause of abnormal reaction of the patient, or of later complication, without mention of misadventure at the time of the procedure: Secondary | ICD-10-CM | POA: Diagnosis present

## 2023-01-29 DIAGNOSIS — I96 Gangrene, not elsewhere classified: Secondary | ICD-10-CM | POA: Diagnosis present

## 2023-01-29 DIAGNOSIS — M109 Gout, unspecified: Secondary | ICD-10-CM | POA: Diagnosis present

## 2023-01-29 DIAGNOSIS — Z886 Allergy status to analgesic agent status: Secondary | ICD-10-CM

## 2023-01-29 DIAGNOSIS — M797 Fibromyalgia: Secondary | ICD-10-CM | POA: Diagnosis present

## 2023-01-29 DIAGNOSIS — D72829 Elevated white blood cell count, unspecified: Secondary | ICD-10-CM | POA: Diagnosis not present

## 2023-01-29 DIAGNOSIS — R5381 Other malaise: Secondary | ICD-10-CM | POA: Diagnosis present

## 2023-01-29 DIAGNOSIS — E1169 Type 2 diabetes mellitus with other specified complication: Secondary | ICD-10-CM | POA: Diagnosis present

## 2023-01-29 DIAGNOSIS — Z515 Encounter for palliative care: Secondary | ICD-10-CM | POA: Diagnosis not present

## 2023-01-29 HISTORY — DX: Dehiscence of amputation stump: T87.81

## 2023-01-29 LAB — BASIC METABOLIC PANEL
Anion gap: 11 (ref 5–15)
BUN: 16 mg/dL (ref 6–20)
CO2: 23 mmol/L (ref 22–32)
Calcium: 8.2 mg/dL — ABNORMAL LOW (ref 8.9–10.3)
Chloride: 107 mmol/L (ref 98–111)
Creatinine, Ser: 6.45 mg/dL — ABNORMAL HIGH (ref 0.44–1.00)
GFR, Estimated: 7 mL/min — ABNORMAL LOW (ref >60)
Glucose, Bld: 138 mg/dL — ABNORMAL HIGH (ref 70–99)
Potassium: 2.8 mmol/L — ABNORMAL LOW (ref 3.5–5.1)
Sodium: 141 mmol/L (ref 135–145)

## 2023-01-29 LAB — CBC WITH DIFFERENTIAL/PLATELET
Abs Immature Granulocytes: 0.17 10*3/uL — ABNORMAL HIGH (ref 0.00–0.07)
Basophils Absolute: 0.1 10*3/uL (ref 0.0–0.1)
Basophils Relative: 0 %
Eosinophils Absolute: 0.4 10*3/uL (ref 0.0–0.5)
Eosinophils Relative: 2 %
HCT: 25.7 % — ABNORMAL LOW (ref 36.0–46.0)
Hemoglobin: 7.8 g/dL — ABNORMAL LOW (ref 12.0–15.0)
Immature Granulocytes: 1 %
Lymphocytes Relative: 16 %
Lymphs Abs: 3 10*3/uL (ref 0.7–4.0)
MCH: 29.2 pg (ref 26.0–34.0)
MCHC: 30.4 g/dL (ref 30.0–36.0)
MCV: 96.3 fL (ref 80.0–100.0)
Monocytes Absolute: 0.9 10*3/uL (ref 0.1–1.0)
Monocytes Relative: 5 %
Neutro Abs: 14.1 10*3/uL — ABNORMAL HIGH (ref 1.7–7.7)
Neutrophils Relative %: 76 %
Platelets: 425 10*3/uL — ABNORMAL HIGH (ref 150–400)
RBC: 2.67 MIL/uL — ABNORMAL LOW (ref 3.87–5.11)
RDW: 15.2 % (ref 11.5–15.5)
WBC: 18.7 10*3/uL — ABNORMAL HIGH (ref 4.0–10.5)
nRBC: 0 % (ref 0.0–0.2)

## 2023-01-29 LAB — SEDIMENTATION RATE: Sed Rate: 90 mm/h — ABNORMAL HIGH (ref 0–22)

## 2023-01-29 LAB — RESP PANEL BY RT-PCR (RSV, FLU A&B, COVID)  RVPGX2
Influenza A by PCR: NEGATIVE
Influenza B by PCR: NEGATIVE
Resp Syncytial Virus by PCR: NEGATIVE
SARS Coronavirus 2 by RT PCR: NEGATIVE

## 2023-01-29 LAB — I-STAT CG4 LACTIC ACID, ED: Lactic Acid, Venous: 2 mmol/L (ref 0.5–1.9)

## 2023-01-29 LAB — C-REACTIVE PROTEIN: CRP: 4.1 mg/dL — ABNORMAL HIGH (ref <1.0)

## 2023-01-29 MED ORDER — SODIUM CHLORIDE 0.9 % IV SOLN
1.0000 g | INTRAVENOUS | Status: DC
  Administered 2023-01-30: 1 g via INTRAVENOUS
  Filled 2023-01-29 (×2): qty 10

## 2023-01-29 MED ORDER — FERRIC CITRATE 1 GM 210 MG(FE) PO TABS
210.0000 mg | ORAL_TABLET | Freq: Three times a day (TID) | ORAL | Status: DC
  Administered 2023-01-30 – 2023-02-15 (×38): 210 mg via ORAL
  Filled 2023-01-29 (×40): qty 1

## 2023-01-29 MED ORDER — LACTATED RINGERS IV SOLN: INTRAVENOUS | Status: DC

## 2023-01-29 MED ORDER — SODIUM CHLORIDE 0.9 % IV SOLN
1.0000 g | Freq: Once | INTRAVENOUS | Status: AC
  Administered 2023-01-29: 1 g via INTRAVENOUS
  Filled 2023-01-29: qty 10

## 2023-01-29 MED ORDER — MORPHINE SULFATE (PF) 4 MG/ML IV SOLN
4.0000 mg | Freq: Once | INTRAVENOUS | Status: AC
  Administered 2023-01-29: 4 mg via INTRAVENOUS
  Filled 2023-01-29: qty 1

## 2023-01-29 MED ORDER — SODIUM CHLORIDE 0.9% FLUSH
3.0000 mL | Freq: Two times a day (BID) | INTRAVENOUS | Status: DC
  Administered 2023-01-29 – 2023-02-06 (×17): 3 mL via INTRAVENOUS

## 2023-01-29 MED ORDER — VANCOMYCIN HCL IN DEXTROSE 1-5 GM/200ML-% IV SOLN
1000.0000 mg | INTRAVENOUS | Status: AC
  Administered 2023-01-29 (×2): 1000 mg via INTRAVENOUS
  Filled 2023-01-29: qty 200

## 2023-01-29 MED ORDER — HEPARIN SODIUM (PORCINE) 5000 UNIT/ML IJ SOLN
5000.0000 [IU] | Freq: Three times a day (TID) | INTRAMUSCULAR | Status: DC
  Administered 2023-01-29 – 2023-02-15 (×48): 5000 [IU] via SUBCUTANEOUS
  Filled 2023-01-29 (×49): qty 1

## 2023-01-29 MED ORDER — VANCOMYCIN HCL 2000 MG/400ML IV SOLN
2000.0000 mg | Freq: Once | INTRAVENOUS | Status: DC
  Filled 2023-01-29: qty 400

## 2023-01-29 MED ORDER — ACETAMINOPHEN 650 MG RE SUPP
650.0000 mg | Freq: Four times a day (QID) | RECTAL | Status: DC | PRN
Start: 2023-01-29 — End: 2023-02-04

## 2023-01-29 MED ORDER — LACTATED RINGERS IV BOLUS (SEPSIS): 500.0000 mL | Freq: Once | INTRAVENOUS | Status: DC

## 2023-01-29 MED ORDER — ONDANSETRON HCL 4 MG PO TABS
4.0000 mg | ORAL_TABLET | Freq: Four times a day (QID) | ORAL | Status: DC | PRN
  Administered 2023-02-04: 4 mg via ORAL
  Filled 2023-01-29: qty 1

## 2023-01-29 MED ORDER — SODIUM CHLORIDE 0.9 % IV SOLN: 250.0000 mL | INTRAVENOUS | Status: AC | PRN

## 2023-01-29 MED ORDER — POTASSIUM CHLORIDE 10 MEQ/100ML IV SOLN
10.0000 meq | INTRAVENOUS | Status: AC
  Administered 2023-01-29 (×3): 10 meq via INTRAVENOUS
  Filled 2023-01-29 (×2): qty 100

## 2023-01-29 MED ORDER — VANCOMYCIN HCL IN DEXTROSE 1-5 GM/200ML-% IV SOLN
1000.0000 mg | Freq: Once | INTRAVENOUS | Status: DC
  Filled 2023-01-29: qty 200

## 2023-01-29 MED ORDER — ONDANSETRON HCL 4 MG/2ML IJ SOLN
4.0000 mg | Freq: Four times a day (QID) | INTRAMUSCULAR | Status: DC | PRN
  Administered 2023-02-02: 4 mg via INTRAVENOUS

## 2023-01-29 MED ORDER — HEPARIN SODIUM (PORCINE) 5000 UNIT/ML IJ SOLN: 5000.0000 [IU] | Freq: Three times a day (TID) | INTRAMUSCULAR | Status: DC

## 2023-01-29 MED ORDER — LACTATED RINGERS IV BOLUS (SEPSIS)
1000.0000 mL | Freq: Once | INTRAVENOUS | Status: AC
  Administered 2023-01-29: 1000 mL via INTRAVENOUS

## 2023-01-29 MED ORDER — HYDROMORPHONE HCL 1 MG/ML IJ SOLN
2.0000 mg | Freq: Once | INTRAMUSCULAR | Status: AC
  Administered 2023-01-29: 2 mg via INTRAVENOUS
  Filled 2023-01-29: qty 2

## 2023-01-29 MED ORDER — METRONIDAZOLE 500 MG/100ML IV SOLN
500.0000 mg | Freq: Once | INTRAVENOUS | Status: AC
Start: 2023-01-29 — End: 2023-01-29
  Administered 2023-01-29: 500 mg via INTRAVENOUS
  Filled 2023-01-29: qty 100

## 2023-01-29 MED ORDER — LACTATED RINGERS IV BOLUS
1000.0000 mL | Freq: Once | INTRAVENOUS | Status: AC
  Administered 2023-01-29: 1000 mL via INTRAVENOUS

## 2023-01-29 MED ORDER — HYDROMORPHONE HCL 1 MG/ML IJ SOLN
2.0000 mg | INTRAMUSCULAR | Status: DC | PRN
  Administered 2023-01-29 – 2023-02-02 (×18): 2 mg via INTRAVENOUS
  Filled 2023-01-29 (×19): qty 2

## 2023-01-29 MED ORDER — DARBEPOETIN ALFA 100 MCG/0.5ML IJ SOSY
100.0000 ug | PREFILLED_SYRINGE | INTRAMUSCULAR | Status: DC
  Administered 2023-01-29 – 2023-02-12 (×3): 100 ug via SUBCUTANEOUS
  Filled 2023-01-29 (×3): qty 0.5

## 2023-01-29 MED ORDER — SODIUM CHLORIDE 0.9% FLUSH: 3.0000 mL | INTRAVENOUS | Status: DC | PRN

## 2023-01-29 MED ORDER — ACETAMINOPHEN 325 MG PO TABS
650.0000 mg | ORAL_TABLET | Freq: Four times a day (QID) | ORAL | Status: DC | PRN
Start: 2023-01-29 — End: 2023-02-04

## 2023-01-29 MED ORDER — ONDANSETRON HCL 4 MG/2ML IJ SOLN
4.0000 mg | Freq: Once | INTRAMUSCULAR | Status: AC
  Administered 2023-01-29: 4 mg via INTRAVENOUS
  Filled 2023-01-29: qty 2

## 2023-01-29 MED ORDER — POTASSIUM CHLORIDE 10 MEQ/100ML IV SOLN
10.0000 meq | INTRAVENOUS | Status: AC
Start: 2023-01-29 — End: 2023-01-29
  Filled 2023-01-29: qty 100

## 2023-01-29 NOTE — ED Triage Notes (Incomplete)
Pt arrived to the ED by [facility]. Pt states she went check her amputation today and noticed that there was a hole in the incision and it was leaking. Pt states it was last cleaned at the [facility] this Thursday.   EMS VS 150 HR 20 RR 231 CBG 96.9 F 94% RA

## 2023-01-29 NOTE — ED Provider Notes (Addendum)
[place] EMERGENCY DEPARTMENT AT [facility] Provider Note   [name]: [id number] Arrival date & time: 01/29/23  1325     History  Chief Complaint  Patient presents with   Wound Check    Deborah [name] is a 57 y.o. female.  57 year old female presents today for concern of dehiscence of her left stump wound.  She underwent below the knee amputation on 10/23.  She was seen on 11/15 for concern of wound infection.  She completed the antibiotic course.  She states when she took the sleeve off today she noticed dehiscence.  Denies any fever or other complaint.  She endorses significant pain to her left stump.  The history is provided by the patient. No language interpreter was used.       Home Medications Prior to Admission medications   Medication Sig Start Date End Date Taking? Authorizing Provider  albuterol (PROVENTIL) (2.5 MG/3ML) 0.083% nebulizer solution Take 3 mLs (2.5 mg total) by nebulization every 6 (six) hours as needed for wheezing or shortness of breath. 04/06/19   Bing Neighbors, NP  allopurinol (ZYLOPRIM) 100 MG tablet Take 1 tablet (100 mg total) by mouth 2 (two) times daily. 11/30/22   Azucena Fallen, MD  ascorbic acid (VITAMIN C) 1000 MG tablet Take 1 tablet (1,000 mg total) by mouth daily. 01/10/23   Danford, Earl Lites, MD  atorvastatin (LIPITOR) 10 MG tablet Take 10 mg by mouth daily. 12/03/21   [provider]  busPIRone (BUSPAR) 5 MG tablet Take 5 mg by mouth 2 (two) times daily.    [provider]  cefdinir (OMNICEF) 300 MG capsule Take 1 capsule (300 mg total) by mouth daily. 01/21/23   Linwood Dibbles, MD  cetirizine (ZYRTEC) 10 MG tablet Take 10 mg by mouth daily. 08/05/22   [provider]  colchicine 0.6 MG tablet Take 0.5 tablets (0.3 mg total) by mouth 2 (two) times a week. 12/02/22   Azucena Fallen, MD  doxercalciferol (HECTOROL) 4 MCG/2ML injection Inject 2 mLs (4 mcg total) into the vein every Monday,  Wednesday, and Friday with hemodialysis. 11/30/22   Azucena Fallen, MD  EASY COMFORT PEN NEEDLES 31G X 5 MM MISC USE 3 TIMES A DAY FOR INSULIN ADMINISTRATION 11/14/19   Meccariello, Solmon Ice, MD  famotidine (PEPCID) 20 MG tablet Take 20 mg by mouth daily.    [provider]  ferric citrate (AURYXIA) 1 GM 210 MG(Fe) tablet Take 2 tablets (420 mg total) by mouth 3 (three) times daily with meals. Patient taking differently: Take 210 mg by mouth 3 (three) times daily with meals. 11/30/22   Azucena Fallen, MD  fluticasone [facility]) 50 MCG/ACT nasal spray Place 2 sprays into both nostrils daily as needed for allergies or rhinitis. 12/19/18   Rai, Delene Ruffini, MD  folic acid (FOLVITE) 1 MG tablet Take 1 tablet (1 mg total) by mouth daily. 01/10/23   Danford, Earl Lites, MD  gabapentin (NEURONTIN) 100 MG capsule Take 1 capsule (100 mg total) by mouth 3 (three) times daily. 11/30/22   Azucena Fallen, MD  HYDROmorphone (DILAUDID) 4 MG tablet Take 1 tablet (4 mg total) by mouth in the morning, at noon, in the evening, and at bedtime. 01/10/23   Danford, Earl Lites, MD  hydrOXYzine (ATARAX) 25 MG tablet Take 1 tablet (25 mg total) by mouth 3 (three) times daily as needed for anxiety. 01/10/23   Danford, Earl Lites, MD  insulin glargine-yfgn (SEMGLEE) 100 UNIT/ML injection  Inject 0.08 mLs (8 Units total) into the skin at bedtime. 01/10/23   Danford, Earl Lites, MD  insulin lispro (HUMALOG) 100 UNIT/ML KwikPen Before each meal 3 times a day, 140-199 - 2 units, 200-250 - 6 units, 251-299 - 8 units,  300-349 - 12 units,  350 or above 14 units. Patient taking differently: Inject 2-14 Units into the skin See admin instructions. Before each meal and at bedtime. 140-199 - 2 units, 200-250 - 6 units, 251-299 - 8 units,  300-349 - 12 units,  350 or above 14 units. 06/04/22   Leroy Sea, MD  loperamide (IMODIUM) 2 MG capsule Take 1 capsule (2 mg total) by mouth as needed for diarrhea or  loose stools. 01/10/23   Danford, Earl Lites, MD  methocarbamol (ROBAXIN) 500 MG tablet Take 1 tablet (500 mg total) by mouth every 6 (six) hours as needed for muscle spasms. 01/10/23   Danford, Earl Lites, MD  metoprolol tartrate (LOPRESSOR) 50 MG tablet Take 1 tablet (50 mg total) by mouth 2 (two) times daily. 01/10/23   Danford, Earl Lites, MD  mirtazapine (REMERON) 15 MG tablet Take 15 mg by mouth at bedtime. 10/18/22   [provider]  nutrition supplement, JUVEN, (JUVEN) PACK Take 1 packet by mouth 2 (two) times daily between meals. 01/10/23   Danford, Earl Lites, MD  Nutritional Supplements (,FEEDING SUPPLEMENT, PROSOURCE PLUS) liquid Take 30 mLs by mouth 2 (two) times daily between meals. 01/10/23   Danford, Earl Lites, MD  Nutritional Supplements (FEEDING SUPPLEMENT, NEPRO CARB STEADY,) LIQD Take 237 mLs by mouth 2 (two) times daily between meals. 01/10/23   Danford, Earl Lites, MD  pantoprazole (PROTONIX) 40 MG tablet Take 40 mg by mouth 2 (two) times daily.    [provider]  zinc sulfate 220 (50 Zn) MG capsule Take 1 capsule (220 mg total) by mouth daily. 01/10/23   Danford, Earl Lites, MD      Allergies    Isovue [iopamidol], Neurontin [gabapentin], Nsaids, Penicillins, Reglan [metoclopramide], Valium [diazepam], Zestril [lisinopril], Tolectin [tolmetin], Asa [aspirin], Aspartame and phenylalanine, Bentyl [dicyclomine], Hibiclens [chlorhexidine gluconate], Flexeril [cyclobenzaprine], Oxycontin [oxycodone], Rifamycins, Tylenol [acetaminophen], and Ultram [tramadol]    Review of Systems   Review of Systems  Constitutional:  Negative for chills and fever.  Respiratory:  Negative for shortness of breath.   Gastrointestinal:  Negative for abdominal pain.  Skin:  Positive for wound.  Neurological:  Negative for light-headedness.  All other systems reviewed and are negative.   Physical Exam Updated Vital Signs BP 120/74 (BP Location: Right Arm)   Pulse  (!) 109   Temp 99.1 F (37.3 C) (Oral)   Resp (!) 21   Ht 5\' 6"  (1.676 m)   Wt 113.4 kg   LMP 10/10/2012   SpO2 100%   BMI 40.35 kg/m  Physical Exam Vitals and nursing note reviewed.  Constitutional:      General: She is not in acute distress.    Appearance: Normal appearance. She is not ill-appearing.  HENT:     Head: Normocephalic and atraumatic.     Nose: Nose normal.  Eyes:     Conjunctiva/sclera: Conjunctivae normal.  Cardiovascular:     Rate and Rhythm: Regular rhythm. Tachycardia present.  Pulmonary:     Effort: Pulmonary effort is normal. No respiratory distress.  Musculoskeletal:        General: No deformity.  Skin:    Findings: No rash.     Comments: Wound noted to left heel, as  well as right stump.  See attached images for description.  Neurological:     Mental Status: She is alert.        ED Results / Procedures / Treatments   Labs (all labs ordered are listed, but only abnormal results are displayed) Labs Reviewed  I-STAT CG4 LACTIC ACID, ED - Abnormal; Notable for the following components:      Result Value   Lactic Acid, Venous 2.0 (*)    All other components within normal limits  CULTURE, BLOOD (ROUTINE X 2)  CULTURE, BLOOD (ROUTINE X 2)  CBC WITH DIFFERENTIAL/PLATELET  BASIC METABOLIC PANEL  C-REACTIVE PROTEIN  SEDIMENTATION RATE    EKG None  Radiology No results found.  Procedures .Critical Care  Performed by: Marita Kansas, PA-C Authorized by: Marita Kansas, PA-C   Critical care provider statement:    Critical care time (minutes):  32   Critical care was necessary to treat or prevent imminent or life-threatening deterioration of the following conditions:  Sepsis   Critical care was time spent personally by me on the following activities:  Development of treatment plan with patient or surrogate, discussions with consultants, evaluation of patient's response to treatment, examination of patient, ordering and review of laboratory studies,  ordering and review of radiographic studies, ordering and performing treatments and interventions, pulse oximetry, re-evaluation of patient's condition and review of old charts     Medications Ordered in ED Medications  HYDROmorphone (DILAUDID) injection 2 mg (has no administration in time range)  ondansetron (ZOFRAN) injection 4 mg (4 mg Intravenous Given 01/29/23 1427)  morphine (PF) 4 MG/ML injection 4 mg (4 mg Intravenous Given 01/29/23 1427)  lactated ringers bolus 1,000 mL (1,000 mLs Intravenous New Bag/Given 01/29/23 1430)    ED Course/ Medical Decision Making/ A&P Clinical Course as of 01/29/23 1537  Sat Jan 29, 2023  1521 S- hx R BKA 1 month ago, here for wound dehiscence. Recently completed abx, looks infected still. Ortho wants admitted -Admit to hospital [HJ]    Clinical Course User Index [HJ] Janyth Pupa, MD                                 Medical Decision Making Amount and/or Complexity of Data Reviewed Labs: ordered. ECG/medicine tests: ordered.  Risk Prescription drug management. Decision regarding hospitalization.   Medical Decision Making / ED Course   This patient presents to the ED for concern of wound dehiscence, this involves an extensive number of treatment options, and is a complaint that carries with it a high risk of complications and morbidity.  The differential diagnosis includes wound infection, wound dehiscence, sepsis  MDM: 57 year old female presents today for concern of wound dehiscence.  She noticed this today.  She is also SIRS positive on arrival.  See attached image for description of the wound.  Her below the knee amputation was performed on 10/23.  Will add on labs including blood cultures and lactic acid.  Will provide some fluid and pain control.  Discussed with Dr. Fara Boros who is covering for Dr. Lajoyce Corners.  Recommends admission and they will plan on revision this week.  Will order Vanco and Zosyn for wound infection.  Discussed with  admitting team. They will admit.  CBC resulted.  Shows significant leukocytosis of 18.7 with left shift.  Meet sepsis criteria.  Sepsis protocol initiated.   Additional history obtained: -Additional history obtained from recent ED visit. -External records from outside  source obtained and reviewed including: Chart review including previous notes, labs, imaging, consultation notes   Lab Tests: -I ordered, reviewed, and interpreted labs.   The pertinent results include:   Labs Reviewed  CBC WITH DIFFERENTIAL/PLATELET - Abnormal; Notable for the following components:      Result Value   WBC 18.7 (*)    RBC 2.67 (*)    Hemoglobin 7.8 (*)    HCT 25.7 (*)    Platelets 425 (*)    Neutro Abs 14.1 (*)    Abs Immature Granulocytes 0.17 (*)    All other components within normal limits  I-STAT CG4 LACTIC ACID, ED - Abnormal; Notable for the following components:   Lactic Acid, Venous 2.0 (*)    All other components within normal limits  CULTURE, BLOOD (ROUTINE X 2)  CULTURE, BLOOD (ROUTINE X 2)  BASIC METABOLIC PANEL  C-REACTIVE PROTEIN  SEDIMENTATION RATE      EKG  EKG Interpretation Date/Time:    Ventricular Rate:    PR Interval:    QRS Duration:    QT Interval:    QTC Calculation:   R Axis:      Text Interpretation:          Medicines ordered and prescription drug management: Meds ordered this encounter  Medications   ondansetron (ZOFRAN) injection 4 mg   morphine (PF) 4 MG/ML injection 4 mg   lactated ringers bolus 1,000 mL   HYDROmorphone (DILAUDID) injection 2 mg    -I have reviewed the patients home medicines and have made adjustments as needed  Critical interventions Fluids, IV antibiotics  Consultations Obtained: I requested consultation with the orthopedic surgery,  and discussed lab and imaging findings as well as pertinent plan - they recommend: As above    Reevaluation: After the interventions noted above, I reevaluated the patient and found that  they have :stayed the same  Co morbidities that complicate the patient evaluation  Past Medical History:  Diagnosis Date   Acute back pain with sciatica, left    Acute back pain with sciatica, right    AKI (acute kidney injury) (HCC)    Anemia, unspecified    Cancer (HCC)    Carcinoid tumor of duodenum    Chest pain with normal coronary angiography 2019   Chronic kidney disease, stage 3b (HCC)    Chronic pain    Chronic systolic CHF (congestive heart failure) (HCC)    Diabetes mellitus    DKA (diabetic ketoacidosis) (HCC)    Drug-seeking behavior    21 hospitalizations and 14 CT a/p in 2 years for N/V and abdominal pain, demanding only IV dilaudid   Elevated troponin    chronic   Esophageal reflux    Fibromyalgia    Gastric ulcer    Gastroparesis    Gout    Hyperlipidemia    Hypertension    Hypokalemia    Hypomagnesemia    Lumbosacral stenosis    LVH (left ventricular hypertrophy)    Morbid obesity (HCC)    NICM (nonischemic cardiomyopathy) (HCC)    PAF (paroxysmal atrial fibrillation) (HCC)    Stroke (HCC) 02/2011   Thrombocytosis    Vitamin B12 deficiency anemia       Dispostion: Discussed with admitting team. They will admit.    Final Clinical Impression(s) / ED Diagnoses Final diagnoses:  Visit for wound check  Wound infection  Wound dehiscence    Rx / DC Orders ED Discharge Orders     None  Marita Kansas, PA-C 01/29/23 1506    Marita Kansas, PA-C 01/29/23 1538    Gloris Manchester, MD 01/29/23 [phone]

## 2023-01-29 NOTE — ED Notes (Signed)
ED TO INPATIENT HANDOFF REPORT  ED Nurse Name and Phone #: Zephyra Bernardi [phone]  S Name/Age/Gender Deborah [name] 57 y.o. female Room/Bed: 039C/039C  Code Status   Code Status: Full Code  Home/SNF/Other Home Patient oriented to: self, place, time, and situation Is this baseline? Yes   Triage Complete: Triage complete  Chief Complaint Wound dehiscence [T81.30XA]  Triage Note No notes on file   Allergies Allergies  Allergen Reactions   Isovue [Iopamidol] Anaphylaxis, [name] and Other (See Comments)    11/28/17 Patient had seizure like activity and then 1 min code after 100 cc of isovue 300. Possible contrast allergy vs vasovagal episode  Cardiac Arrest   Neurontin [Gabapentin] [name] and Swelling   Nsaids Anaphylaxis and Other (See Comments)    Hx of stomach ulcers   Penicillins Itching, Palpitations and Other (See Comments)    Flushing (Red Skin) Laryngeal Edema   Reglan [Metoclopramide] Other (See Comments)    Tardive dyskinesia    Valium [Diazepam] [name]   Zestril [Lisinopril] Anaphylaxis and Swelling    Tongue and mouth swelling Laryngeal Edema   Tolectin [Tolmetin] Nausea And Vomiting, Nausea Only and Other (See Comments)    Irritates stomach ulcer   Asa [Aspirin] Other (See Comments)    Hx of stomach ulcer   Aspartame And Phenylalanine Hives   Bentyl [Dicyclomine] Other (See Comments)    Chest pain   Hibiclens [Chlorhexidine Gluconate] Other (See Comments)    Dermatitis    Flexeril [Cyclobenzaprine] Palpitations   Oxycontin [Oxycodone] Palpitations   Rifamycins Palpitations   Tylenol [Acetaminophen] Nausea And Vomiting, Nausea Only and Other (See Comments)    Irritates stomach ulcer Abdominal pain   Ultram [Tramadol] Nausea And Vomiting and Palpitations    Level of Care/Admitting Diagnosis ED Disposition     ED Disposition  Admit   Condition  --   Comment  Hospital Area: [facility]  [[id number]]  Level of Care: Progressive [102]  Admit to Progressive based on following criteria: CARDIOVASCULAR & THORACIC of moderate stability with acute coronary syndrome symptoms/low risk myocardial infarction/hypertensive urgency/arrhythmias/heart failure potentially compromising stability and stable post cardiovascular intervention patients.  May admit patient to [facility] or [facility] if equivalent level of care is available:: No  Covid Evaluation: Confirmed COVID Negative  Diagnosis: Wound dehiscence [369010]  Admitting Physician: Meredeth Ide [[id number]]  Attending Physician: Meredeth Ide [[id number]]  Certification:: I certify this patient will need inpatient services for at least 2 midnights  Expected Medical Readiness: 02/01/2023          B Medical/Surgery History Past Medical History:  Diagnosis Date   Acute back pain with sciatica, left    Acute back pain with sciatica, right    AKI (acute kidney injury) (HCC)    Anemia, unspecified    Cancer (HCC)    Carcinoid tumor of duodenum    Chest pain with normal coronary angiography 2019   Chronic kidney disease, stage 3b (HCC)    Chronic pain    Chronic systolic CHF (congestive heart failure) (HCC)    Diabetes mellitus    DKA (diabetic ketoacidosis) (HCC)    Drug-seeking behavior    21 hospitalizations and 14 CT a/p in 2 years for N/V and abdominal pain, demanding only IV dilaudid   Elevated troponin    chronic   Esophageal reflux    Fibromyalgia    Gastric ulcer    Gastroparesis    Gout  Hyperlipidemia    Hypertension    Hypokalemia    Hypomagnesemia    Lumbosacral stenosis    LVH (left ventricular hypertrophy)    Morbid obesity (HCC)    NICM (nonischemic cardiomyopathy) (HCC)    PAF (paroxysmal atrial fibrillation) (HCC)    Stroke (HCC) 02/2011   Thrombocytosis    Vitamin B12 deficiency anemia    Past Surgical History:  Procedure Laterality Date   ABDOMINAL AORTOGRAM W/LOWER EXTREMITY N/A 11/29/2022    Procedure: ABDOMINAL AORTOGRAM W/LOWER EXTREMITY;  Surgeon: Daria Pastures, MD;  Location: [facility] INVASIVE CV LAB;  Service: Cardiovascular;  Laterality: N/A;   AMPUTATION Right 12/29/2022   Procedure: RIGHT BELOW KNEE AMPUTATION;  Surgeon: Nadara Mustard, MD;  Location: [facility] OR;  Service: Orthopedics;  Laterality: Right;   AV FISTULA PLACEMENT Left 06/30/2022   Procedure: LEFT BRACHIOCEPHALIC ARTERIOVENOUS (AV) FISTULA CREATION;  Surgeon: Cephus Shelling, MD;  Location: [facility] OR;  Service: Vascular;  Laterality: Left;   BIOPSY  07/27/2019   Procedure: BIOPSY;  Surgeon: Vida Rigger, MD;  Location: [facility] ENDOSCOPY;  Service: Endoscopy;;   BIOPSY  07/30/2019   Procedure: BIOPSY;  Surgeon: Kathi Der, MD;  Location: [facility] ENDOSCOPY;  Service: Gastroenterology;;   CATARACT EXTRACTION  01/2014   CHOLECYSTECTOMY     COLONOSCOPY WITH PROPOFOL N/A 07/30/2019   Procedure: COLONOSCOPY WITH PROPOFOL;  Surgeon: Kathi Der, MD;  Location: [facility] ENDOSCOPY;  Service: Gastroenterology;  Laterality: N/A;   ESOPHAGOGASTRODUODENOSCOPY N/A 07/27/2019   Procedure: ESOPHAGOGASTRODUODENOSCOPY (EGD);  Surgeon: Vida Rigger, MD;  Location: [facility];  Service: Endoscopy;  Laterality: N/A;   ESOPHAGOGASTRODUODENOSCOPY N/A 07/26/2020   Procedure: ESOPHAGOGASTRODUODENOSCOPY (EGD);  Surgeon: Willis Modena, MD;  Location: [facility];  Service: Endoscopy;  Laterality: N/A;   ESOPHAGOGASTRODUODENOSCOPY (EGD) WITH PROPOFOL N/A 08/02/2019   Procedure: ESOPHAGOGASTRODUODENOSCOPY (EGD) WITH PROPOFOL;  Surgeon: Kathi Der, MD;  Location: [facility];  Service: Gastroenterology;  Laterality: N/A;   ESOPHAGOGASTRODUODENOSCOPY (EGD) WITH PROPOFOL N/A 12/23/2022   Procedure: ESOPHAGOGASTRODUODENOSCOPY (EGD) WITH PROPOFOL;  Surgeon: Shellia Cleverly, DO;  Location: [facility] ENDOSCOPY;  Service: Gastroenterology;  Laterality: N/A;   FISTULA SUPERFICIALIZATION Left 12/31/2022   Procedure: LEFT ARM FISTULA TRANSPOSITION;  Surgeon:  Maeola Harman, MD;  Location: [facility] OR;  Service: Vascular;  Laterality: Left;   GIVENS CAPSULE STUDY N/A 12/23/2022   Procedure: GIVENS CAPSULE STUDY;  Surgeon: Shellia Cleverly, DO;  Location: [facility] ENDOSCOPY;  Service: Gastroenterology;  Laterality: N/A;   HEMOSTASIS CLIP PLACEMENT  08/02/2019   Procedure: HEMOSTASIS CLIP PLACEMENT;  Surgeon: Kathi Der, MD;  Location: [facility];  Service: Gastroenterology;;   HOT HEMOSTASIS N/A 12/23/2022   Procedure: HOT HEMOSTASIS (ARGON PLASMA COAGULATION/BICAP);  Surgeon: Shellia Cleverly, DO;  Location: [facility] ENDOSCOPY;  Service: Gastroenterology;  Laterality: N/A;   IR FLUORO GUIDE CV LINE RIGHT  06/24/2022   IR US GUIDE VASC ACCESS RIGHT  06/24/2022   POLYPECTOMY  07/30/2019   Procedure: POLYPECTOMY;  Surgeon: Kathi Der, MD;  Location: [facility];  Service: Gastroenterology;;   POLYPECTOMY  08/02/2019   Procedure: POLYPECTOMY;  Surgeon: Kathi Der, MD;  Location: [facility];  Service: Gastroenterology;;     A IV Location/Drains/Wounds Patient Lines/Drains/Airways Status     Active Line/Drains/Airways     Name Placement date Placement time Site Days   Peripheral IV 01/29/23 20 G Right Antecubital 01/29/23  1425  Antecubital  less than 1   Fistula / Graft Left Upper arm Arteriovenous fistula 06/30/22  1200  Upper arm  213   Hemodialysis  Catheter Right Subclavian Double lumen Permanent (Tunneled) 06/24/22  1644  Subclavian  219   Negative Pressure Wound Therapy Leg Right --  --  --  --   Pressure Injury 11/23/22 Ischial tuberosity Left Unstageable - Full thickness tissue loss in which the base of the injury is covered by slough (yellow, tan, gray, green or brown) and/or eschar (tan, brown or black) in the wound bed. 11/23/22  0853  -- 67   Pressure Injury 11/24/22 Ischial tuberosity Right Stage 2 -  Partial thickness loss of dermis presenting as a shallow open injury with a red, pink wound bed without slough. stage  two injury to upper thigh into groin region. 11/24/22  1452  -- 66   Wound / Incision (Open or Dehisced) 06/19/22 Skin tear Sacrum Mid stage 2 small abraison present on admission 06/19/22  1840  Sacrum  224   Wound / Incision (Open or Dehisced) 06/19/22 Heel Left 06/19/22  2100  Heel  224            Intake/Output Last 24 hours No intake or output data in the 24 hours ending 01/29/23 1628  Labs/Imaging Results for orders placed or performed during the hospital encounter of 01/29/23 (from the past 48 hour(s))  CBC with Differential     Status: Abnormal   Collection Time: 01/29/23  2:30 PM  Result Value Ref Range   WBC 18.7 (H) 4.0 - 10.5 K/uL   RBC 2.67 (L) 3.87 - 5.11 MIL/uL   Hemoglobin 7.8 (L) 12.0 - 15.0 g/dL   HCT 19.1 (L) 47.8 - 29.5 %   MCV 96.3 80.0 - 100.0 fL   MCH 29.2 26.0 - 34.0 pg   MCHC 30.4 30.0 - 36.0 g/dL   RDW 62.1 30.8 - 65.7 %   Platelets 425 (H) 150 - 400 K/uL   nRBC 0.0 0.0 - 0.2 %   Neutrophils Relative % 76 %   Neutro Abs 14.1 (H) 1.7 - 7.7 K/uL   Lymphocytes Relative 16 %   Lymphs Abs 3.0 0.7 - 4.0 K/uL   Monocytes Relative 5 %   Monocytes Absolute 0.9 0.1 - 1.0 K/uL   Eosinophils Relative 2 %   Eosinophils Absolute 0.4 0.0 - 0.5 K/uL   Basophils Relative 0 %   Basophils Absolute 0.1 0.0 - 0.1 K/uL   Immature Granulocytes 1 %   Abs Immature Granulocytes 0.17 (H) 0.00 - 0.07 K/uL    Comment: Performed at [facility], [address], [place], [place] [address]  Basic metabolic panel     Status: Abnormal   Collection Time: 01/29/23  2:30 PM  Result Value Ref Range   Sodium 141 135 - 145 mmol/L   Potassium 2.8 (L) 3.5 - 5.1 mmol/L   Chloride 107 98 - 111 mmol/L   CO2 23 22 - 32 mmol/L   Glucose, Bld 138 (H) 70 - 99 mg/dL    Comment: Glucose reference range applies only to samples taken after fasting for at least 8 hours.   BUN 16 6 - 20 mg/dL   Creatinine, Ser 2.95 (H) 0.44 - 1.00 mg/dL   Calcium 8.2 (L) 8.9 - 10.3 mg/dL   GFR, Estimated  7 (L) >60 mL/min    Comment: (NOTE) Calculated using the CKD-EPI Creatinine Equation (2021)    Anion gap 11 5 - 15    Comment: Performed at [facility], [address], [place], [place] [address]  C-reactive protein     Status:  Abnormal   Collection Time: 01/29/23  2:30 PM  Result Value Ref Range   CRP 4.1 (H) <1.0 mg/dL    Comment: Performed at [facility], [address] [place], [place] [address]  Sedimentation rate     Status: Abnormal   Collection Time: 01/29/23  2:30 PM  Result Value Ref Range   Sed Rate 90 (H) 0 - 22 mm/hr    Comment: Performed at [facility], [address] [place], [place] [address]  I-Stat CG4 Lactic Acid     Status: Abnormal   Collection Time: 01/29/23  2:50 PM  Result Value Ref Range   Lactic Acid, Venous 2.0 (HH) 0.5 - 1.9 mmol/L   Comment NOTIFIED PHYSICIAN    No results found.  Pending Labs Unresulted Labs (From admission, onward)     Start     Ordered   01/29/23 1505  Resp panel by RT-PCR (RSV, Flu A&B, Covid) Anterior Nasal Swab  (Septic presentation on arrival (screening labs, nursing and treatment orders for obvious sepsis))  Once,   URGENT        01/29/23 1505   01/29/23 1419  Blood culture (routine x 2)  BLOOD CULTURE X 2,   R (with STAT occurrences)      01/29/23 1418   Signed and Held  CBC  (heparin)  Once,   R       Comments: Baseline for heparin therapy IF NOT ALREADY DRAWN.  Notify MD if PLT < 100 K.    Signed and Held   Signed and Held  Creatinine, serum  (heparin)  Once,   R       Comments: Baseline for heparin therapy IF NOT ALREADY DRAWN.    Signed and Held   Signed and Held  CBC  Tomorrow morning,   R        Signed and Held   Signed and Held  Comprehensive metabolic panel  Tomorrow morning,   R        Signed and Held   Signed and Held  Hemoglobin A1c  Once,   R       Comments: To assess prior glycemic control    Signed and Held            Vitals/Pain Today's Vitals   01/29/23 1345 01/29/23  1346 01/29/23 1351 01/29/23 1435  BP: 120/74     Pulse: (!) 114   (!) 109  Resp: (!) 22   (!) 21  Temp: 99.1 F (37.3 C)     TempSrc: Oral     SpO2: 99%   100%  Weight:   113.4 kg   Height:   5\' 6"  (1.676 m)   PainSc:  10-Worst pain ever      Isolation Precautions No active isolations  Medications Medications  lactated ringers infusion ( Intravenous New Bag/Given 01/29/23 1512)  lactated ringers bolus 1,000 mL (0 mLs Intravenous Stopped 01/29/23 1605)    And  lactated ringers bolus 1,000 mL (1,000 mLs Intravenous New Bag/Given 01/29/23 1623)    And  lactated ringers bolus 500 mL (has no administration in time range)  ceFEPIme (MAXIPIME) 1 g in sodium chloride 0.9 % 100 mL IVPB (has no administration in time range)  metroNIDAZOLE (FLAGYL) IVPB 500 mg (has no administration in time range)  vancomycin (VANCOCIN) IVPB 1000 mg/200 mL premix (has no administration in time range)    Followed by  vancomycin (VANCOCIN) IVPB 1000 mg/200 mL premix (has no administration  in time range)  potassium chloride 10 mEq in 100 mL IVPB (has no administration in time range)  ondansetron (ZOFRAN) injection 4 mg (4 mg Intravenous Given 01/29/23 1427)  morphine (PF) 4 MG/ML injection 4 mg (4 mg Intravenous Given 01/29/23 1427)  lactated ringers bolus 1,000 mL (1,000 mLs Intravenous New Bag/Given 01/29/23 1430)  HYDROmorphone (DILAUDID) injection 2 mg (2 mg Intravenous Given 01/29/23 1511)    Mobility non-ambulatory     Focused Assessment Musculoskeletal   R Recommendations: See Admitting Provider Note  Report given to:   Additional Notes:

## 2023-01-29 NOTE — Progress Notes (Signed)
[facility]link is following code sepsis 

## 2023-01-29 NOTE — Progress Notes (Signed)
ED Pharmacy Antibiotic Sign Off An antibiotic consult was received from an ED provider for cefepime and vancomycin per pharmacy dosing for  wound infection . A chart review was completed to assess appropriateness.  The following one time order(s) were placed per pharmacy consult:  cefepime 1000 mg x 1 dose vancomycin 2000 mg x 1 dose  Further antibiotic and/or antibiotic pharmacy consults should be ordered by the admitting provider if indicated.   Thank you for allowing pharmacy to be a part of this patient's care.   Delmar Landau, PharmD, BCPS 01/29/2023 3:13 PM ED Clinical Pharmacist -  [phone]

## 2023-01-29 NOTE — Consult Note (Signed)
Renal Service Consult Note [facility]  Deborah [name] 01/29/2023 Deborah Krabbe, MD Requesting Physician: Dr. Sharl Ma  Reason for Consult: ESRD pt w/  HPI: The patient is a 57 y.o. year-old w/ PMH as below who presented to ED this afternoon for wound dehiscence of her left BKA. Pt presented to ED w/ SIRS due to infected R stump/ wounds. Started on IV vanc/ cefepime/ and flagyl. Ortho consulted. Pt admitted. We are asked to see for dialysis.   Pt seen in room in ED.  No c/o's. Usual HD is Monday. Missed Wed HD. Denies any SOB or CP. No abd pain.   ROS - denies CP, no joint pain, no HA, no blurry vision, no rash, no diarrhea, no nausea/ vomiting   Past Medical History  Past Medical History:  Diagnosis Date   Acute back pain with sciatica, left    Acute back pain with sciatica, right    AKI (acute kidney injury) (HCC)    Anemia, unspecified    Cancer (HCC)    Carcinoid tumor of duodenum    Chest pain with normal coronary angiography 2019   Chronic kidney disease, stage 3b (HCC)    Chronic pain    Chronic systolic CHF (congestive heart failure) (HCC)    Diabetes mellitus    DKA (diabetic ketoacidosis) (HCC)    Drug-seeking behavior    21 hospitalizations and 14 CT a/p in 2 years for N/V and abdominal pain, demanding only IV dilaudid   Elevated troponin    chronic   Esophageal reflux    Fibromyalgia    Gastric ulcer    Gastroparesis    Gout    Hyperlipidemia    Hypertension    Hypokalemia    Hypomagnesemia    Lumbosacral stenosis    LVH (left ventricular hypertrophy)    Morbid obesity (HCC)    NICM (nonischemic cardiomyopathy) (HCC)    PAF (paroxysmal atrial fibrillation) (HCC)    Stroke (HCC) 02/2011   Thrombocytosis    Vitamin B12 deficiency anemia    Past Surgical History  Past Surgical History:  Procedure Laterality Date   ABDOMINAL AORTOGRAM W/LOWER EXTREMITY N/A 11/29/2022   Procedure: ABDOMINAL AORTOGRAM W/LOWER EXTREMITY;  Surgeon:  Daria Pastures, MD;  Location: [facility];  Service: Cardiovascular;  Laterality: N/A;   AMPUTATION Right 12/29/2022   Procedure: RIGHT BELOW KNEE AMPUTATION;  Surgeon: Nadara Mustard, MD;  Location: [facility] OR;  Service: Orthopedics;  Laterality: Right;   AV FISTULA PLACEMENT Left 06/30/2022   Procedure: LEFT BRACHIOCEPHALIC ARTERIOVENOUS (AV) FISTULA CREATION;  Surgeon: Cephus Shelling, MD;  Location: [facility] OR;  Service: Vascular;  Laterality: Left;   BIOPSY  07/27/2019   Procedure: BIOPSY;  Surgeon: Vida Rigger, MD;  Location: [facility] ENDOSCOPY;  Service: Endoscopy;;   BIOPSY  07/30/2019   Procedure: BIOPSY;  Surgeon: Kathi Der, MD;  Location: [facility] ENDOSCOPY;  Service: Gastroenterology;;   CATARACT EXTRACTION  01/2014   CHOLECYSTECTOMY     COLONOSCOPY WITH PROPOFOL N/A 07/30/2019   Procedure: COLONOSCOPY WITH PROPOFOL;  Surgeon: Kathi Der, MD;  Location: [facility] ENDOSCOPY;  Service: Gastroenterology;  Laterality: N/A;   ESOPHAGOGASTRODUODENOSCOPY N/A 07/27/2019   Procedure: ESOPHAGOGASTRODUODENOSCOPY (EGD);  Surgeon: Vida Rigger, MD;  Location: [facility];  Service: Endoscopy;  Laterality: N/A;   ESOPHAGOGASTRODUODENOSCOPY N/A 07/26/2020   Procedure: ESOPHAGOGASTRODUODENOSCOPY (EGD);  Surgeon: Willis Modena, MD;  Location: [facility];  Service: Endoscopy;  Laterality: N/A;   ESOPHAGOGASTRODUODENOSCOPY (EGD) WITH PROPOFOL N/A 08/02/2019   Procedure: ESOPHAGOGASTRODUODENOSCOPY (  EGD) WITH PROPOFOL;  Surgeon: Kathi Der, MD;  Location: [facility];  Service: Gastroenterology;  Laterality: N/A;   ESOPHAGOGASTRODUODENOSCOPY (EGD) WITH PROPOFOL N/A 12/23/2022   Procedure: ESOPHAGOGASTRODUODENOSCOPY (EGD) WITH PROPOFOL;  Surgeon: Shellia Cleverly, DO;  Location: [facility] ENDOSCOPY;  Service: Gastroenterology;  Laterality: N/A;   FISTULA SUPERFICIALIZATION Left 12/31/2022   Procedure: LEFT ARM FISTULA TRANSPOSITION;  Surgeon: Maeola Harman, MD;  Location: [facility] OR;  Service:  Vascular;  Laterality: Left;   GIVENS CAPSULE STUDY N/A 12/23/2022   Procedure: GIVENS CAPSULE STUDY;  Surgeon: Shellia Cleverly, DO;  Location: [facility] ENDOSCOPY;  Service: Gastroenterology;  Laterality: N/A;   HEMOSTASIS CLIP PLACEMENT  08/02/2019   Procedure: HEMOSTASIS CLIP PLACEMENT;  Surgeon: Kathi Der, MD;  Location: [facility];  Service: Gastroenterology;;   HOT HEMOSTASIS N/A 12/23/2022   Procedure: HOT HEMOSTASIS (ARGON PLASMA COAGULATION/BICAP);  Surgeon: Shellia Cleverly, DO;  Location: [facility] ENDOSCOPY;  Service: Gastroenterology;  Laterality: N/A;   IR FLUORO GUIDE CV LINE RIGHT  06/24/2022   IR US GUIDE VASC ACCESS RIGHT  06/24/2022   POLYPECTOMY  07/30/2019   Procedure: POLYPECTOMY;  Surgeon: Kathi Der, MD;  Location: [facility];  Service: Gastroenterology;;   POLYPECTOMY  08/02/2019   Procedure: POLYPECTOMY;  Surgeon: Kathi Der, MD;  Location: [facility] ENDOSCOPY;  Service: Gastroenterology;;   Family History  Family History  Problem Relation Age of Onset   Diabetes Mother    Diabetes Father    Heart disease Father    Diabetes Sister    Congestive Heart Failure Sister 62   Diabetes Brother    Social History  reports that she has never smoked. She has never used smokeless tobacco. She reports that she does not drink alcohol and does not use drugs. Allergies  Allergies  Allergen Reactions   Isovue [Iopamidol] Anaphylaxis, [name] and Other (See Comments)    11/28/17 Patient had seizure like activity and then 1 min code after 100 cc of isovue 300. Possible contrast allergy vs vasovagal episode  Cardiac Arrest   Neurontin [Gabapentin] [name] and Swelling   Nsaids Anaphylaxis and Other (See Comments)    Hx of stomach ulcers   Penicillins Itching, Palpitations and Other (See Comments)    Flushing (Red Skin) Laryngeal Edema   Reglan [Metoclopramide] Other (See Comments)    Tardive dyskinesia    Valium [Diazepam] [name]    Zestril [Lisinopril] Anaphylaxis and Swelling    Tongue and mouth swelling Laryngeal Edema   Tolectin [Tolmetin] Nausea And Vomiting, Nausea Only and Other (See Comments)    Irritates stomach ulcer   Asa [Aspirin] Other (See Comments)    Hx of stomach ulcer   Aspartame And Phenylalanine Hives   Bentyl [Dicyclomine] Other (See Comments)    Chest pain   Hibiclens [Chlorhexidine Gluconate] Other (See Comments)    Dermatitis    Flexeril [Cyclobenzaprine] Palpitations   Oxycontin [Oxycodone] Palpitations   Rifamycins Palpitations   Tylenol [Acetaminophen] Nausea And Vomiting, Nausea Only and Other (See Comments)    Irritates stomach ulcer Abdominal pain   Ultram [Tramadol] Nausea And Vomiting and Palpitations   Home medications Prior to Admission medications   Medication Sig Start Date End Date Taking? Authorizing Provider  albuterol (PROVENTIL) (2.5 MG/3ML) 0.083% nebulizer solution Take 3 mLs (2.5 mg total) by nebulization every 6 (six) hours as needed for wheezing or shortness of breath. 04/06/19   Bing Neighbors, NP  allopurinol (ZYLOPRIM) 100 MG tablet Take 1 tablet (100 mg total)  by mouth 2 (two) times daily. 11/30/22   Azucena Fallen, MD  ascorbic acid (VITAMIN C) 1000 MG tablet Take 1 tablet (1,000 mg total) by mouth daily. 01/10/23   Danford, Earl Lites, MD  atorvastatin (LIPITOR) 10 MG tablet Take 10 mg by mouth daily. 12/03/21   [provider]  busPIRone (BUSPAR) 5 MG tablet Take 5 mg by mouth 2 (two) times daily.    [provider]  cefdinir (OMNICEF) 300 MG capsule Take 1 capsule (300 mg total) by mouth daily. 01/21/23   Linwood Dibbles, MD  cetirizine (ZYRTEC) 10 MG tablet Take 10 mg by mouth daily. 08/05/22   [provider]  colchicine 0.6 MG tablet Take 0.5 tablets (0.3 mg total) by mouth 2 (two) times a week. 12/02/22   Azucena Fallen, MD  doxercalciferol (HECTOROL) 4 MCG/2ML injection Inject 2 mLs (4 mcg total) into the vein every  Monday, Wednesday, and Friday with hemodialysis. 11/30/22   Azucena Fallen, MD  EASY COMFORT PEN NEEDLES 31G X 5 MM MISC USE 3 TIMES A DAY FOR INSULIN ADMINISTRATION 11/14/19   Meccariello, Solmon Ice, MD  famotidine (PEPCID) 20 MG tablet Take 20 mg by mouth daily.    [provider]  ferric citrate (AURYXIA) 1 GM 210 MG(Fe) tablet Take 2 tablets (420 mg total) by mouth 3 (three) times daily with meals. Patient taking differently: Take 210 mg by mouth 3 (three) times daily with meals. 11/30/22   Azucena Fallen, MD  fluticasone [facility]) 50 MCG/ACT nasal spray Place 2 sprays into both nostrils daily as needed for allergies or rhinitis. 12/19/18   Rai, Delene Ruffini, MD  folic acid (FOLVITE) 1 MG tablet Take 1 tablet (1 mg total) by mouth daily. 01/10/23   Danford, Earl Lites, MD  gabapentin (NEURONTIN) 100 MG capsule Take 1 capsule (100 mg total) by mouth 3 (three) times daily. 11/30/22   Azucena Fallen, MD  HYDROmorphone (DILAUDID) 4 MG tablet Take 1 tablet (4 mg total) by mouth in the morning, at noon, in the evening, and at bedtime. 01/10/23   Danford, Earl Lites, MD  hydrOXYzine (ATARAX) 25 MG tablet Take 1 tablet (25 mg total) by mouth 3 (three) times daily as needed for anxiety. 01/10/23   Danford, Earl Lites, MD  insulin glargine-yfgn (SEMGLEE) 100 UNIT/ML injection Inject 0.08 mLs (8 Units total) into the skin at bedtime. 01/10/23   Danford, Earl Lites, MD  insulin lispro (HUMALOG) 100 UNIT/ML KwikPen Before each meal 3 times a day, 140-199 - 2 units, 200-250 - 6 units, 251-299 - 8 units,  300-349 - 12 units,  350 or above 14 units. Patient taking differently: Inject 2-14 Units into the skin See admin instructions. Before each meal and at bedtime. 140-199 - 2 units, 200-250 - 6 units, 251-299 - 8 units,  300-349 - 12 units,  350 or above 14 units. 06/04/22   Leroy Sea, MD  loperamide (IMODIUM) 2 MG capsule Take 1 capsule (2 mg total) by mouth as needed for diarrhea  or loose stools. 01/10/23   Danford, Earl Lites, MD  methocarbamol (ROBAXIN) 500 MG tablet Take 1 tablet (500 mg total) by mouth every 6 (six) hours as needed for muscle spasms. 01/10/23   Danford, Earl Lites, MD  metoprolol tartrate (LOPRESSOR) 50 MG tablet Take 1 tablet (50 mg total) by mouth 2 (two) times daily. 01/10/23   Danford, Earl Lites, MD  mirtazapine (REMERON) 15 MG tablet Take 15 mg by mouth at  bedtime. 10/18/22   [provider]  nutrition supplement, JUVEN, (JUVEN) PACK Take 1 packet by mouth 2 (two) times daily between meals. 01/10/23   Danford, Earl Lites, MD  Nutritional Supplements (,FEEDING SUPPLEMENT, PROSOURCE PLUS) liquid Take 30 mLs by mouth 2 (two) times daily between meals. 01/10/23   Danford, Earl Lites, MD  Nutritional Supplements (FEEDING SUPPLEMENT, NEPRO CARB STEADY,) LIQD Take 237 mLs by mouth 2 (two) times daily between meals. 01/10/23   Danford, Earl Lites, MD  pantoprazole (PROTONIX) 40 MG tablet Take 40 mg by mouth 2 (two) times daily.    [provider]  zinc sulfate 220 (50 Zn) MG capsule Take 1 capsule (220 mg total) by mouth daily. 01/10/23   Alberteen Sam, MD     Vitals:   01/29/23 1345 01/29/23 1351 01/29/23 1435  BP: 120/74    Pulse: (!) 114  (!) 109  Resp: (!) 22  (!) 21  Temp: 99.1 F (37.3 C)    TempSrc: Oral    SpO2: 99%  100%  Weight:  113.4 kg   Height:  5\' 6"  (1.676 m)    Exam Gen alert, no distress No rash, cyanosis or gangrene Sclera anicteric, throat clear  No jvd or bruits Chest clear bilat to bases, no rales/ wheezing RRR no MRG Abd soft ntnd no mass or ascites +bs GU defer MS [name] wounds w/ large openings and drainage Ext no L lower ext edema Neuro is alert, Ox 3 , nf    RIJ TDC in place      Renal-related home meds: - auryxia 1 ac tid - gabapentin 100 tid - metoprolol 50 bid - others: buspar, remeron    OP HD: MWF [facility]  4h  400/800  121kg   3K/2.5Ca bath   TDC   Heparin  none - last OP HD 11/18, post wt 124.6kg  - has been usually reaching dry wt - missed 2 sessions in last 3 wks 11/20 and 11/13 - was in hospital 10/9- 11/04 here  - dry wt lowered 6 kg 2 wks ago - venofer 100mg  q hd thru 12/04 - mircera 100 mcg q 2 wks, last 10/5, due 10/19    Labs --> Na 141  K 2.8  BUN 16,  creat 6.45    wBC 18  hb 7.8     Assessment/ Plan: Sepsis - presenting w/ SIRS, due to dehisced [name]. IV abx started and ortho consulted. Per pmd.  ESKD - on HD MWF. Last HD Monday. Labs and vol okay. Will plan HD here tomorrow per holiday schedule.  HTN - bp's wnl, takes metoprolol at home. Cont meds as needed.  Volume - no gross vol excess on exam. Plan UF 2.5 L w/ HD tomorrow.  Anemia of eskd - Hb 7- 8 here. Overdue for esa, will start darbe here at 100 mcg weekly. Tranfuse prn.  MBD ckd - CCa in range. Add on phos. Binders w/ meals.       Vinson Moselle  MD [facility] 01/29/2023, 5:46 PM  Recent Labs  Lab 01/27/23 1355 01/29/23 1430  HGB 7.3* 7.8*  ALBUMIN 2.0*  --   CALCIUM 8.3* 8.2*  CREATININE 5.39* 6.45*  K 3.1* 2.8*   Inpatient medications:  heparin  5,000 Units Subcutaneous Q8H   sodium chloride flush  3 mL Intravenous Q12H    sodium chloride     ceFEPime (MAXIPIME) IV 1 g (01/29/23 1735)   [START ON 01/30/2023] ceFEPime (MAXIPIME)  IV     potassium chloride     vancomycin     Followed by   vancomycin     sodium chloride, acetaminophen **OR** acetaminophen, HYDROmorphone (DILAUDID) injection, ondansetron **OR** ondansetron (ZOFRAN) IV, sodium chloride flush

## 2023-01-29 NOTE — H&P (Signed)
TRH H&P    Patient Demographics:    Deborah [name], is a 57 y.o. female  MRN: [id number]  DOB - September 27, 1965  Admit Date - 01/29/2023  Referring MD/NP/PA: Chaney Malling  Outpatient Primary MD for the patient is Pcp, No  Patient coming from: Home  Chief complaint-painful wound   HPI:    Deborah [name]  is a 57 y.o. female, with history of ESRD on MWF hemodialysis, paroxysmal atrial fibrillation, anticoagulation with Eliquis on hold due to recent GI bleed from gastric AVMs, HFpEF, hypertension, diabetes mellitus type 2, malignant carcinoid, NAFLD, morbid obesity, osteomyelitis of right calcaneus, status post right BKA on 10/23 was discharged to skilled facility for rehab and recently came back home.  Patient noted that her right stump wound was open with mild discharge and was painful. Patient was recently seen in the ED on 11/15 for concern of wound infection, at that time she was prescribed antibiotics and discharged home. She denies fever, no chills. Denies nausea or vomiting, has chronic diarrhea Denies chest pain or shortness of breath Denies abdominal pain  In the ED patient was found to be tachycardic, tachypneic, leukocytosis with WBC 18,000 She was started on vancomycin, cefepime and Flagyl Orthopedics Dr. Fara Boros was consulted who is covering for Dr. Lajoyce Corners Recommended admission and patient will need revision of left BKA    Review of systems:    In addition to the HPI above,    All other systems reviewed and are negative.    Past History of the following :    Past Medical History:  Diagnosis Date   Acute back pain with sciatica, left    Acute back pain with sciatica, right    AKI (acute kidney injury) (HCC)    Anemia, unspecified    Cancer (HCC)    Carcinoid tumor of duodenum    Chest pain with normal coronary angiography 2019   Chronic kidney disease, stage 3b (HCC)    Chronic pain     Chronic systolic CHF (congestive heart failure) (HCC)    Diabetes mellitus    DKA (diabetic ketoacidosis) (HCC)    Drug-seeking behavior    21 hospitalizations and 14 CT a/p in 2 years for N/V and abdominal pain, demanding only IV dilaudid   Elevated troponin    chronic   Esophageal reflux    Fibromyalgia    Gastric ulcer    Gastroparesis    Gout    Hyperlipidemia    Hypertension    Hypokalemia    Hypomagnesemia    Lumbosacral stenosis    LVH (left ventricular hypertrophy)    Morbid obesity (HCC)    NICM (nonischemic cardiomyopathy) (HCC)    PAF (paroxysmal atrial fibrillation) (HCC)    Stroke (HCC) 02/2011   Thrombocytosis    Vitamin B12 deficiency anemia       Past Surgical History:  Procedure Laterality Date   ABDOMINAL AORTOGRAM W/LOWER EXTREMITY N/A 11/29/2022   Procedure: ABDOMINAL AORTOGRAM W/LOWER EXTREMITY;  Surgeon: Daria Pastures, MD;  Location: [facility];  Service: Cardiovascular;  Laterality: N/A;   AMPUTATION Right 12/29/2022   Procedure: RIGHT BELOW KNEE AMPUTATION;  Surgeon: Nadara Mustard, MD;  Location: [facility] OR;  Service: Orthopedics;  Laterality: Right;   AV FISTULA PLACEMENT Left 06/30/2022   Procedure: LEFT BRACHIOCEPHALIC ARTERIOVENOUS (AV) FISTULA CREATION;  Surgeon: Cephus Shelling, MD;  Location: [facility] OR;  Service: Vascular;  Laterality: Left;   BIOPSY  07/27/2019   Procedure: BIOPSY;  Surgeon: Vida Rigger, MD;  Location: [facility];  Service: Endoscopy;;   BIOPSY  07/30/2019   Procedure: BIOPSY;  Surgeon: Kathi Der, MD;  Location: [facility];  Service: Gastroenterology;;   CATARACT EXTRACTION  01/2014   CHOLECYSTECTOMY     COLONOSCOPY WITH PROPOFOL N/A 07/30/2019   Procedure: COLONOSCOPY WITH PROPOFOL;  Surgeon: Kathi Der, MD;  Location: [facility];  Service: Gastroenterology;  Laterality: N/A;   ESOPHAGOGASTRODUODENOSCOPY N/A 07/27/2019   Procedure: ESOPHAGOGASTRODUODENOSCOPY (EGD);  Surgeon: Vida Rigger, MD;   Location: [facility];  Service: Endoscopy;  Laterality: N/A;   ESOPHAGOGASTRODUODENOSCOPY N/A 07/26/2020   Procedure: ESOPHAGOGASTRODUODENOSCOPY (EGD);  Surgeon: Willis Modena, MD;  Location: [facility];  Service: Endoscopy;  Laterality: N/A;   ESOPHAGOGASTRODUODENOSCOPY (EGD) WITH PROPOFOL N/A 08/02/2019   Procedure: ESOPHAGOGASTRODUODENOSCOPY (EGD) WITH PROPOFOL;  Surgeon: Kathi Der, MD;  Location: [facility];  Service: Gastroenterology;  Laterality: N/A;   ESOPHAGOGASTRODUODENOSCOPY (EGD) WITH PROPOFOL N/A 12/23/2022   Procedure: ESOPHAGOGASTRODUODENOSCOPY (EGD) WITH PROPOFOL;  Surgeon: Shellia Cleverly, DO;  Location: [facility];  Service: Gastroenterology;  Laterality: N/A;   FISTULA SUPERFICIALIZATION Left 12/31/2022   Procedure: LEFT ARM FISTULA TRANSPOSITION;  Surgeon: Maeola Harman, MD;  Location: [facility] OR;  Service: Vascular;  Laterality: Left;   GIVENS CAPSULE STUDY N/A 12/23/2022   Procedure: GIVENS CAPSULE STUDY;  Surgeon: Shellia Cleverly, DO;  Location: [facility];  Service: Gastroenterology;  Laterality: N/A;   HEMOSTASIS CLIP PLACEMENT  08/02/2019   Procedure: HEMOSTASIS CLIP PLACEMENT;  Surgeon: Kathi Der, MD;  Location: [facility];  Service: Gastroenterology;;   HOT HEMOSTASIS N/A 12/23/2022   Procedure: HOT HEMOSTASIS (ARGON PLASMA COAGULATION/BICAP);  Surgeon: Shellia Cleverly, DO;  Location: [facility] ENDOSCOPY;  Service: Gastroenterology;  Laterality: N/A;   IR FLUORO GUIDE CV LINE RIGHT  06/24/2022   IR US GUIDE VASC ACCESS RIGHT  06/24/2022   POLYPECTOMY  07/30/2019   Procedure: POLYPECTOMY;  Surgeon: Kathi Der, MD;  Location: [facility];  Service: Gastroenterology;;   POLYPECTOMY  08/02/2019   Procedure: POLYPECTOMY;  Surgeon: Kathi Der, MD;  Location: [facility];  Service: Gastroenterology;;      Social History:      Social History   Tobacco Use   Smoking status: Never   Smokeless tobacco: Never  Substance  Use Topics   Alcohol use: No       Family History :     Family History  Problem Relation Age of Onset   Diabetes Mother    Diabetes Father    Heart disease Father    Diabetes Sister    Congestive Heart Failure Sister 71   Diabetes Brother       Home Medications:   Prior to Admission medications   Medication Sig Start Date End Date Taking? Authorizing Provider  albuterol (PROVENTIL) (2.5 MG/3ML) 0.083% nebulizer solution Take 3 mLs (2.5 mg total) by nebulization every 6 (six) hours as needed for wheezing or shortness of breath. 04/06/19   Bing Neighbors, NP  allopurinol (ZYLOPRIM) 100 MG tablet Take 1 tablet (100 mg total) by mouth 2 (two) times daily. 11/30/22  Azucena Fallen, MD  ascorbic acid (VITAMIN C) 1000 MG tablet Take 1 tablet (1,000 mg total) by mouth daily. 01/10/23   Danford, Earl Lites, MD  atorvastatin (LIPITOR) 10 MG tablet Take 10 mg by mouth daily. 12/03/21   [provider]  busPIRone (BUSPAR) 5 MG tablet Take 5 mg by mouth 2 (two) times daily.    [provider]  cefdinir (OMNICEF) 300 MG capsule Take 1 capsule (300 mg total) by mouth daily. 01/21/23   Linwood Dibbles, MD  cetirizine (ZYRTEC) 10 MG tablet Take 10 mg by mouth daily. 08/05/22   [provider]  colchicine 0.6 MG tablet Take 0.5 tablets (0.3 mg total) by mouth 2 (two) times a week. 12/02/22   Azucena Fallen, MD  doxercalciferol (HECTOROL) 4 MCG/2ML injection Inject 2 mLs (4 mcg total) into the vein every Monday, Wednesday, and Friday with hemodialysis. 11/30/22   Azucena Fallen, MD  EASY COMFORT PEN NEEDLES 31G X 5 MM MISC USE 3 TIMES A DAY FOR INSULIN ADMINISTRATION 11/14/19   Meccariello, Solmon Ice, MD  famotidine (PEPCID) 20 MG tablet Take 20 mg by mouth daily.    [provider]  ferric citrate (AURYXIA) 1 GM 210 MG(Fe) tablet Take 2 tablets (420 mg total) by mouth 3 (three) times daily with meals. Patient taking differently: Take 210 mg by mouth 3  (three) times daily with meals. 11/30/22   Azucena Fallen, MD  fluticasone [facility]) 50 MCG/ACT nasal spray Place 2 sprays into both nostrils daily as needed for allergies or rhinitis. 12/19/18   Rai, Delene Ruffini, MD  folic acid (FOLVITE) 1 MG tablet Take 1 tablet (1 mg total) by mouth daily. 01/10/23   Danford, Earl Lites, MD  gabapentin (NEURONTIN) 100 MG capsule Take 1 capsule (100 mg total) by mouth 3 (three) times daily. 11/30/22   Azucena Fallen, MD  HYDROmorphone (DILAUDID) 4 MG tablet Take 1 tablet (4 mg total) by mouth in the morning, at noon, in the evening, and at bedtime. 01/10/23   Danford, Earl Lites, MD  hydrOXYzine (ATARAX) 25 MG tablet Take 1 tablet (25 mg total) by mouth 3 (three) times daily as needed for anxiety. 01/10/23   Danford, Earl Lites, MD  insulin glargine-yfgn (SEMGLEE) 100 UNIT/ML injection Inject 0.08 mLs (8 Units total) into the skin at bedtime. 01/10/23   Danford, Earl Lites, MD  insulin lispro (HUMALOG) 100 UNIT/ML KwikPen Before each meal 3 times a day, 140-199 - 2 units, 200-250 - 6 units, 251-299 - 8 units,  300-349 - 12 units,  350 or above 14 units. Patient taking differently: Inject 2-14 Units into the skin See admin instructions. Before each meal and at bedtime. 140-199 - 2 units, 200-250 - 6 units, 251-299 - 8 units,  300-349 - 12 units,  350 or above 14 units. 06/04/22   Leroy Sea, MD  loperamide (IMODIUM) 2 MG capsule Take 1 capsule (2 mg total) by mouth as needed for diarrhea or loose stools. 01/10/23   Danford, Earl Lites, MD  methocarbamol (ROBAXIN) 500 MG tablet Take 1 tablet (500 mg total) by mouth every 6 (six) hours as needed for muscle spasms. 01/10/23   Danford, Earl Lites, MD  metoprolol tartrate (LOPRESSOR) 50 MG tablet Take 1 tablet (50 mg total) by mouth 2 (two) times daily. 01/10/23   Danford, Earl Lites, MD  mirtazapine (REMERON) 15 MG tablet Take 15 mg by mouth at bedtime. 10/18/22   [provider]   nutrition  supplement, JUVEN, (JUVEN) PACK Take 1 packet by mouth 2 (two) times daily between meals. 01/10/23   Danford, Earl Lites, MD  Nutritional Supplements (,FEEDING SUPPLEMENT, PROSOURCE PLUS) liquid Take 30 mLs by mouth 2 (two) times daily between meals. 01/10/23   Danford, Earl Lites, MD  Nutritional Supplements (FEEDING SUPPLEMENT, NEPRO CARB STEADY,) LIQD Take 237 mLs by mouth 2 (two) times daily between meals. 01/10/23   Danford, Earl Lites, MD  pantoprazole (PROTONIX) 40 MG tablet Take 40 mg by mouth 2 (two) times daily.    [provider]  zinc sulfate 220 (50 Zn) MG capsule Take 1 capsule (220 mg total) by mouth daily. 01/10/23   Danford, Earl Lites, MD     Allergies:     Allergies  Allergen Reactions   Isovue [Iopamidol] Anaphylaxis, [name] and Other (See Comments)    11/28/17 Patient had seizure like activity and then 1 min code after 100 cc of isovue 300. Possible contrast allergy vs vasovagal episode  Cardiac Arrest   Neurontin [Gabapentin] [name] and Swelling   Nsaids Anaphylaxis and Other (See Comments)    Hx of stomach ulcers   Penicillins Itching, Palpitations and Other (See Comments)    Flushing (Red Skin) Laryngeal Edema   Reglan [Metoclopramide] Other (See Comments)    Tardive dyskinesia    Valium [Diazepam] [name]   Zestril [Lisinopril] Anaphylaxis and Swelling    Tongue and mouth swelling Laryngeal Edema   Tolectin [Tolmetin] Nausea And Vomiting, Nausea Only and Other (See Comments)    Irritates stomach ulcer   Asa [Aspirin] Other (See Comments)    Hx of stomach ulcer   Aspartame And Phenylalanine Hives   Bentyl [Dicyclomine] Other (See Comments)    Chest pain   Hibiclens [Chlorhexidine Gluconate] Other (See Comments)    Dermatitis    Flexeril [Cyclobenzaprine] Palpitations   Oxycontin [Oxycodone] Palpitations   Rifamycins Palpitations   Tylenol [Acetaminophen] Nausea And Vomiting, Nausea Only  and Other (See Comments)    Irritates stomach ulcer Abdominal pain   Ultram [Tramadol] Nausea And Vomiting and Palpitations     Physical Exam:   Vitals  Blood pressure 120/74, pulse (!) 109, temperature 99.1 F (37.3 C), temperature source Oral, resp. rate (!) 21, height 5\' 6"  (1.676 m), weight 113.4 kg, last menstrual period 10/10/2012, SpO2 100%.  1.  General: Appears in no acute distress  2. Psychiatric: Alert, oriented x 3, intact insight and judgment  3. Neurologic: Cranial nerves II through XII grossly intact, no focal deficit noted  4. HEENMT:  Atraumatic normocephalic, extraocular muscles are intact  5. Respiratory : Lungs are clear to auscultation bilaterally  6. Cardiovascular : S1-S2, regular, no murmur auscultated  7. Gastrointestinal:  Abdomen is soft, nontender, no organomegaly  8. Skin:    9.Musculoskeletal:      Data Review:    CBC Recent Labs  Lab 01/27/23 1355 01/29/23 1430  WBC 15.3* 18.7*  HGB 7.3* 7.8*  HCT 23.7* 25.7*  PLT 403* 425*  MCV 94.4 96.3  MCH 29.1 29.2  MCHC 30.8 30.4  RDW 14.9 15.2  LYMPHSABS 2.1 3.0  MONOABS 0.7 0.9  EOSABS 0.3 0.4  BASOSABS 0.0 0.1   ------------------------------------------------------------------------------------------------------------------  Results for orders placed or performed during the hospital encounter of 01/29/23 (from the past 48 hour(s))  CBC with Differential     Status: Abnormal   Collection Time: 01/29/23  2:30 PM  Result Value Ref Range   WBC 18.7 (H) 4.0 -  10.5 K/uL   RBC 2.67 (L) 3.87 - 5.11 MIL/uL   Hemoglobin 7.8 (L) 12.0 - 15.0 g/dL   HCT 33.2 (L) 95.1 - 88.4 %   MCV 96.3 80.0 - 100.0 fL   MCH 29.2 26.0 - 34.0 pg   MCHC 30.4 30.0 - 36.0 g/dL   RDW 16.6 06.3 - 01.6 %   Platelets 425 (H) 150 - 400 K/uL   nRBC 0.0 0.0 - 0.2 %   Neutrophils Relative % 76 %   Neutro Abs 14.1 (H) 1.7 - 7.7 K/uL   Lymphocytes Relative 16 %   Lymphs Abs 3.0 0.7 - 4.0 K/uL   Monocytes  Relative 5 %   Monocytes Absolute 0.9 0.1 - 1.0 K/uL   Eosinophils Relative 2 %   Eosinophils Absolute 0.4 0.0 - 0.5 K/uL   Basophils Relative 0 %   Basophils Absolute 0.1 0.0 - 0.1 K/uL   Immature Granulocytes 1 %   Abs Immature Granulocytes 0.17 (H) 0.00 - 0.07 K/uL    Comment: Performed at [facility], [address] [place], [place] [address]  Basic metabolic panel     Status: Abnormal   Collection Time: 01/29/23  2:30 PM  Result Value Ref Range   Sodium 141 135 - 145 mmol/L   Potassium 2.8 (L) 3.5 - 5.1 mmol/L   Chloride 107 98 - 111 mmol/L   CO2 23 22 - 32 mmol/L   Glucose, Bld 138 (H) 70 - 99 mg/dL    Comment: Glucose reference range applies only to samples taken after fasting for at least 8 hours.   BUN 16 6 - 20 mg/dL   Creatinine, Ser 2.35 (H) 0.44 - 1.00 mg/dL   Calcium 8.2 (L) 8.9 - 10.3 mg/dL   GFR, Estimated 7 (L) >60 mL/min    Comment: (NOTE) Calculated using the CKD-EPI Creatinine Equation (2021)    Anion gap 11 5 - 15    Comment: Performed at [facility], [address], [place], [place] [address]  C-reactive protein     Status: Abnormal   Collection Time: 01/29/23  2:30 PM  Result Value Ref Range   CRP 4.1 (H) <1.0 mg/dL    Comment: Performed at [facility], [address], [place], [place] [address]  Sedimentation rate     Status: Abnormal   Collection Time: 01/29/23  2:30 PM  Result Value Ref Range   Sed Rate 90 (H) 0 - 22 mm/hr    Comment: Performed at [facility], [address] [place], [place] [address]  I-Stat CG4 Lactic Acid     Status: Abnormal   Collection Time: 01/29/23  2:50 PM  Result Value Ref Range   Lactic Acid, Venous 2.0 (HH) 0.5 - 1.9 mmol/L   Comment NOTIFIED PHYSICIAN     Chemistries  Recent Labs  Lab 01/27/23 1355 01/29/23 1430  NA 138 141  K 3.1* 2.8*  CL 102 107  CO2 24 23  GLUCOSE 123* 138*  BUN 13 16  CREATININE 5.39* 6.45*  CALCIUM 8.3* 8.2*  AST 17  --   ALT 10  --   ALKPHOS 89  --    BILITOT 0.8  --    ------------------------------------------------------------------------------------------------------------------  ------------------------------------------------------------------------------------------------------------------ GFR: Estimated Creatinine Clearance: 12.1 mL/min (A) (by C-G formula based on SCr of 6.45 mg/dL (H)). Liver Function Tests: Recent Labs  Lab 01/27/23 1355  AST 17  ALT 10  ALKPHOS 89  BILITOT 0.8  PROT 6.6  ALBUMIN 2.0*   Recent Labs  Lab 01/27/23 1355  LIPASE 21   No results for input(s): "AMMONIA" in the last 168 hours. Coagulation Profile: No results for input(s): "INR", "PROTIME" in the last 168 hours. Cardiac Enzymes: No results for input(s): "CKTOTAL", "CKMB", "CKMBINDEX", "TROPONINI" in the last 168 hours. BNP (last 3 results) No results for input(s): "PROBNP" in the last 8760 hours. HbA1C: No results for input(s): "HGBA1C" in the last 72 hours. CBG: No results for input(s): "GLUCAP" in the last 168 hours. Lipid Profile: No results for input(s): "CHOL", "HDL", "LDLCALC", "TRIG", "CHOLHDL", "LDLDIRECT" in the last 72 hours. Thyroid Function Tests: No results for input(s): "TSH", "T4TOTAL", "FREET4", "T3FREE", "THYROIDAB" in the last 72 hours. Anemia Panel: No results for input(s): "VITAMINB12", "FOLATE", "FERRITIN", "TIBC", "IRON", "RETICCTPCT" in the last 72 hours.  --------------------------------------------------------------------------------------------------------------- Urine analysis:    Component Value Date/Time   COLORURINE STRAW (A) 06/19/2022 0638   APPEARANCEUR HAZY (A) 06/19/2022 0638   LABSPEC 1.010 06/19/2022 0638   PHURINE 5.0 06/19/2022 0638   GLUCOSEU NEGATIVE 06/19/2022 0638   HGBUR SMALL (A) 06/19/2022 0638   BILIRUBINUR NEGATIVE 06/19/2022 0638   KETONESUR NEGATIVE 06/19/2022 0638   PROTEINUR 30 (A) 06/19/2022 0638   UROBILINOGEN 0.2 10/02/2013 2108   NITRITE NEGATIVE 06/19/2022 0638    LEUKOCYTESUR LARGE (A) 06/19/2022 1610      Imaging Results:    No results found.  My personal review of EKG: Rhythm NSR, no ST changes   Assessment & Plan:    Principal Problem:   Wound dehiscence   Sepsis due to wound infection-patient presented with SIRS, secondary to infected right stump/wound dehiscence.  Started on vancomycin, cefepime and Flagyl.  Orthopedics has been consulted.  ESRD on hemodialysis MWF-patient on hemodialysis MWF, will consult nephrology.  Last hemodialysis was on Monday.  Hypokalemia-potassium 2.8, replaced in the ED.  Paroxysmal atrial fibrillation-EKG shows sinus tachycardia.  She was taken off anticoagulation due to recent GI bleed from AVMs.  Continue to monitor on telemetry.  Diabetes mellitus type 2-start sliding scale insulin NovoLog.  Start Semglee 8 units subcu daily.  Chronic wounds-will consult wound care  History of HFpEF-volume management with hemodialysis per nephrology  Metastatic carcinoid, history of duodenal resection-follow-up PCP as outpatient    DVT Prophylaxis-   heparin  AM Labs Ordered, also please review Full Orders  Family Communication: Admission, patients condition and plan of care including tests being ordered have been discussed with the patient and her mother at bedside who indicate understanding and agree with the plan and Code Status.  Code Status: Full code  Admission status: Inpatient :The appropriate admission status for this patient is INPATIENT. Inpatient status is judged to be reasonable and necessary in order to provide the required intensity of service to ensure the patient's safety. The patient's presenting symptoms, physical exam findings, and initial radiographic and laboratory data in the context of their chronic comorbidities is felt to place them at high risk for further clinical deterioration. Furthermore, it is not anticipated that the patient will be medically stable for discharge from the hospital  within 2 midnights of admission. The following factors support the admission status of inpatient.      I certify that at the point of admission it is my clinical judgment that the patient will require inpatient hospital care spanning beyond 2 midnights from the point of admission due to high intensity of service, high risk for further deterioration and high frequency of surveillance required.*  Time spent in minutes :  60 minutes   Chistian Kasler S Holleigh Crihfield M.D

## 2023-01-29 NOTE — Progress Notes (Signed)
Pharmacy Antibiotic Note  Deborah [name] is a 57 y.o. female for which pharmacy has been consulted for cefepime and vancomycin dosing for   infected right stump/wound dehiscence .  Patient with a history of ESRD MWF HD, AF, HF, HTN, T2DM, osteomyelitis of right calcaneus, status post right BKA on 10/23.  WBC 18.7; LA 2; T 99.1; HR 109; RR 21  Plan: Vancomycin 2g once in ED - repeat doses pending nephro plan for HD Cefepime 1g q24hr Monitor WBC, fever, renal function, cultures De-escalate when able F/u Nephrology plan  Height: 5\' 6"  (167.6 cm) Weight: 113.4 kg (250 lb) IBW/kg (Calculated) : 59.3  Temp (24hrs), Avg:99.1 F (37.3 C), Min:99.1 F (37.3 C), Max:99.1 F (37.3 C)  Recent Labs  Lab 01/27/23 1355 01/29/23 1430 01/29/23 1450  WBC 15.3* 18.7*  --   CREATININE 5.39* 6.45*  --   LATICACIDVEN  --   --  2.0*    Estimated Creatinine Clearance: 12.1 mL/min (A) (by C-G formula based on SCr of 6.45 mg/dL (H)).    Allergies  Allergen Reactions   Isovue [Iopamidol] Anaphylaxis, [name] and Other (See Comments)    11/28/17 Patient had seizure like activity and then 1 min code after 100 cc of isovue 300. Possible contrast allergy vs vasovagal episode  Cardiac Arrest   Neurontin [Gabapentin] [name] and Swelling   Nsaids Anaphylaxis and Other (See Comments)    Hx of stomach ulcers   Penicillins Itching, Palpitations and Other (See Comments)    Flushing (Red Skin) Laryngeal Edema   Reglan [Metoclopramide] Other (See Comments)    Tardive dyskinesia    Valium [Diazepam] [name]   Zestril [Lisinopril] Anaphylaxis and Swelling    Tongue and mouth swelling Laryngeal Edema   Tolectin [Tolmetin] Nausea And Vomiting, Nausea Only and Other (See Comments)    Irritates stomach ulcer   Asa [Aspirin] Other (See Comments)    Hx of stomach ulcer   Aspartame And Phenylalanine Hives   Bentyl [Dicyclomine] Other (See Comments)    Chest pain    Hibiclens [Chlorhexidine Gluconate] Other (See Comments)    Dermatitis    Flexeril [Cyclobenzaprine] Palpitations   Oxycontin [Oxycodone] Palpitations   Rifamycins Palpitations   Tylenol [Acetaminophen] Nausea And Vomiting, Nausea Only and Other (See Comments)    Irritates stomach ulcer Abdominal pain   Ultram [Tramadol] Nausea And Vomiting and Palpitations   Microbiology results: Pending  Thank you for allowing pharmacy to be a part of this patient's care.  Delmar Landau, PharmD, BCPS 01/29/2023 4:13 PM ED Clinical Pharmacist -  [phone]

## 2023-01-30 DIAGNOSIS — N186 End stage renal disease: Secondary | ICD-10-CM

## 2023-01-30 DIAGNOSIS — L089 Local infection of the skin and subcutaneous tissue, unspecified: Secondary | ICD-10-CM

## 2023-01-30 DIAGNOSIS — T8130XA Disruption of wound, unspecified, initial encounter: Secondary | ICD-10-CM

## 2023-01-30 DIAGNOSIS — T148XXA Other injury of unspecified body region, initial encounter: Secondary | ICD-10-CM | POA: Diagnosis not present

## 2023-01-30 DIAGNOSIS — Z5189 Encounter for other specified aftercare: Secondary | ICD-10-CM | POA: Diagnosis not present

## 2023-01-30 LAB — COMPREHENSIVE METABOLIC PANEL
ALT: 9 U/L (ref 0–44)
AST: 15 U/L (ref 15–41)
Albumin: 2 g/dL — ABNORMAL LOW (ref 3.5–5.0)
Alkaline Phosphatase: 97 U/L (ref 38–126)
Anion gap: 11 (ref 5–15)
BUN: 17 mg/dL (ref 6–20)
CO2: 21 mmol/L — ABNORMAL LOW (ref 22–32)
Calcium: 7.9 mg/dL — ABNORMAL LOW (ref 8.9–10.3)
Chloride: 107 mmol/L (ref 98–111)
Creatinine, Ser: 5.91 mg/dL — ABNORMAL HIGH (ref 0.44–1.00)
GFR, Estimated: 8 mL/min — ABNORMAL LOW (ref >60)
Glucose, Bld: 103 mg/dL — ABNORMAL HIGH (ref 70–99)
Potassium: 3.4 mmol/L — ABNORMAL LOW (ref 3.5–5.1)
Sodium: 139 mmol/L (ref 135–145)
Total Bilirubin: 0.7 mg/dL (ref <1.2)
Total Protein: 6.2 g/dL — ABNORMAL LOW (ref 6.5–8.1)

## 2023-01-30 LAB — CBC
HCT: 27.7 % — ABNORMAL LOW (ref 36.0–46.0)
HCT: 24.1 % — ABNORMAL LOW (ref 36.0–46.0)
Hemoglobin: 8.4 g/dL — ABNORMAL LOW (ref 12.0–15.0)
Hemoglobin: 7.4 g/dL — ABNORMAL LOW (ref 12.0–15.0)
MCH: 29.1 pg (ref 26.0–34.0)
MCH: 29.1 pg (ref 26.0–34.0)
MCHC: 30.3 g/dL (ref 30.0–36.0)
MCHC: 30.7 g/dL (ref 30.0–36.0)
MCV: 95.8 fL (ref 80.0–100.0)
MCV: 94.9 fL (ref 80.0–100.0)
Platelets: 332 10*3/uL (ref 150–400)
Platelets: 398 10*3/uL (ref 150–400)
RBC: 2.89 MIL/uL — ABNORMAL LOW (ref 3.87–5.11)
RBC: 2.54 MIL/uL — ABNORMAL LOW (ref 3.87–5.11)
RDW: 15.1 % (ref 11.5–15.5)
RDW: 15.4 % (ref 11.5–15.5)
WBC: 15.7 10*3/uL — ABNORMAL HIGH (ref 4.0–10.5)
WBC: 15.2 10*3/uL — ABNORMAL HIGH (ref 4.0–10.5)
nRBC: 0 % (ref 0.0–0.2)
nRBC: 0 % (ref 0.0–0.2)

## 2023-01-30 LAB — CREATININE, SERUM
Creatinine, Ser: 5.5 mg/dL — ABNORMAL HIGH (ref 0.44–1.00)
GFR, Estimated: 8 mL/min — ABNORMAL LOW (ref >60)

## 2023-01-30 LAB — HEPATITIS B SURFACE ANTIGEN: Hepatitis B Surface Ag: NONREACTIVE

## 2023-01-30 LAB — GLUCOSE, CAPILLARY
Glucose-Capillary: 112 mg/dL — ABNORMAL HIGH (ref 70–99)
Glucose-Capillary: 123 mg/dL — ABNORMAL HIGH (ref 70–99)
Glucose-Capillary: 149 mg/dL — ABNORMAL HIGH (ref 70–99)

## 2023-01-30 LAB — LACTIC ACID, PLASMA: Lactic Acid, Venous: 1.6 mmol/L (ref 0.5–1.9)

## 2023-01-30 LAB — PHOSPHORUS: Phosphorus: 4.4 mg/dL (ref 2.5–4.6)

## 2023-01-30 MED ORDER — METOPROLOL TARTRATE 50 MG PO TABS
50.0000 mg | ORAL_TABLET | Freq: Two times a day (BID) | ORAL | Status: DC
  Administered 2023-01-31 – 2023-02-15 (×26): 50 mg via ORAL
  Filled 2023-01-30 (×29): qty 1

## 2023-01-30 MED ORDER — POTASSIUM CHLORIDE CRYS ER 20 MEQ PO TBCR
40.0000 meq | EXTENDED_RELEASE_TABLET | Freq: Once | ORAL | Status: AC
  Administered 2023-01-30: 40 meq via ORAL
  Filled 2023-01-30: qty 4

## 2023-01-30 MED ORDER — PANTOPRAZOLE SODIUM 40 MG PO TBEC
40.0000 mg | DELAYED_RELEASE_TABLET | Freq: Two times a day (BID) | ORAL | Status: DC
  Administered 2023-01-30 – 2023-02-15 (×30): 40 mg via ORAL
  Filled 2023-01-30 (×30): qty 1

## 2023-01-30 MED ORDER — GABAPENTIN 100 MG PO CAPS
100.0000 mg | ORAL_CAPSULE | Freq: Three times a day (TID) | ORAL | Status: DC
  Administered 2023-01-30 (×3): 100 mg via ORAL
  Filled 2023-01-30 (×3): qty 1

## 2023-01-30 MED ORDER — LOPERAMIDE HCL 2 MG PO CAPS
2.0000 mg | ORAL_CAPSULE | ORAL | Status: DC | PRN
  Administered 2023-01-30 – 2023-02-10 (×8): 2 mg via ORAL
  Filled 2023-01-30 (×9): qty 1

## 2023-01-30 MED ORDER — BUSPIRONE HCL 5 MG PO TABS
5.0000 mg | ORAL_TABLET | Freq: Three times a day (TID) | ORAL | Status: DC
  Administered 2023-01-30 – 2023-02-15 (×46): 5 mg via ORAL
  Filled 2023-01-30 (×46): qty 1

## 2023-01-30 MED ORDER — GERHARDT'S BUTT CREAM
TOPICAL_CREAM | Freq: Three times a day (TID) | CUTANEOUS | Status: DC
  Administered 2023-02-02 – 2023-02-06 (×4): 1 via TOPICAL
  Filled 2023-01-30 (×4): qty 1

## 2023-01-30 MED ORDER — INSULIN ASPART 100 UNIT/ML IJ SOLN
0.0000 [IU] | Freq: Three times a day (TID) | INTRAMUSCULAR | Status: DC
Start: 2023-01-30 — End: 2023-02-04
  Administered 2023-01-31: 3 [IU] via SUBCUTANEOUS
  Administered 2023-02-02: 1 [IU] via SUBCUTANEOUS
  Administered 2023-02-02: 5 [IU] via SUBCUTANEOUS
  Administered 2023-02-02 – 2023-02-03 (×4): 2 [IU] via SUBCUTANEOUS

## 2023-01-30 MED ORDER — MIRTAZAPINE 15 MG PO TABS
15.0000 mg | ORAL_TABLET | Freq: Every day | ORAL | Status: DC
  Administered 2023-01-30 – 2023-02-13 (×15): 15 mg via ORAL
  Filled 2023-01-30 (×15): qty 1

## 2023-01-30 MED ORDER — METHOCARBAMOL 500 MG PO TABS
500.0000 mg | ORAL_TABLET | Freq: Four times a day (QID) | ORAL | Status: DC | PRN
  Administered 2023-01-30 – 2023-02-13 (×21): 500 mg via ORAL
  Filled 2023-01-30 (×22): qty 1

## 2023-01-30 MED ORDER — HYDROXYZINE HCL 25 MG PO TABS
25.0000 mg | ORAL_TABLET | Freq: Three times a day (TID) | ORAL | Status: DC | PRN
  Administered 2023-01-30 – 2023-02-05 (×8): 25 mg via ORAL
  Filled 2023-01-30 (×9): qty 1

## 2023-01-30 MED ORDER — VANCOMYCIN HCL IN DEXTROSE 1-5 GM/200ML-% IV SOLN
1000.0000 mg | INTRAVENOUS | Status: AC
  Administered 2023-01-30 – 2023-02-01 (×3): 1000 mg via INTRAVENOUS
  Filled 2023-01-30 (×3): qty 200

## 2023-01-30 NOTE — Consult Note (Addendum)
ORTHOPAEDIC CONSULTATION  REQUESTING PHYSICIAN: Maretta Bees, MD  Chief Complaint: Right lower extremity wound dehiscence status post prior BKA  HPI: Deborah [name] is a 57 y.o. female who presents with right lower extremity stump wound dehiscence status post prior BKA on 10/23 with Dr. Lajoyce Corners.  Was recently seen approximately 1 week prior with ongoing drainage, had been placed on antibiotics.  Recently discharged from her nursing home, was doing dressing changes with stump shrinker, noted dehiscence at the wound yesterday.  Was seen in the emergency department, decision made to admit to medicine, likely revision at the right lower extremity amputation site this week with Dr. Lajoyce Corners.  Currently receiving IV antibiotic therapy.  Resting comfortably in room currently, dressings just changed.  Also with a history of nonpressure chronic ulcer of left heel and midfoot, being managed with dry dressing changes.  Past Medical History:  Diagnosis Date   Acute back pain with sciatica, left    Acute back pain with sciatica, right    AKI (acute kidney injury) (HCC)    Anemia, unspecified    Cancer (HCC)    Carcinoid tumor of duodenum    Chest pain with normal coronary angiography 2019   Chronic kidney disease, stage 3b (HCC)    Chronic pain    Chronic systolic CHF (congestive heart failure) (HCC)    Diabetes mellitus    DKA (diabetic ketoacidosis) (HCC)    Drug-seeking behavior    21 hospitalizations and 14 CT a/p in 2 years for N/V and abdominal pain, demanding only IV dilaudid   Elevated troponin    chronic   Esophageal reflux    Fibromyalgia    Gastric ulcer    Gastroparesis    Gout    Hyperlipidemia    Hypertension    Hypokalemia    Hypomagnesemia    Lumbosacral stenosis    LVH (left ventricular hypertrophy)    Morbid obesity (HCC)    NICM (nonischemic cardiomyopathy) (HCC)    PAF (paroxysmal atrial fibrillation) (HCC)    Stroke (HCC) 02/2011   Thrombocytosis     Vitamin B12 deficiency anemia    Past Surgical History:  Procedure Laterality Date   ABDOMINAL AORTOGRAM W/LOWER EXTREMITY N/A 11/29/2022   Procedure: ABDOMINAL AORTOGRAM W/LOWER EXTREMITY;  Surgeon: Daria Pastures, MD;  Location: [facility];  Service: Cardiovascular;  Laterality: N/A;   AMPUTATION Right 12/29/2022   Procedure: RIGHT BELOW KNEE AMPUTATION;  Surgeon: Nadara Mustard, MD;  Location: [facility] OR;  Service: Orthopedics;  Laterality: Right;   AV FISTULA PLACEMENT Left 06/30/2022   Procedure: LEFT BRACHIOCEPHALIC ARTERIOVENOUS (AV) FISTULA CREATION;  Surgeon: Cephus Shelling, MD;  Location: [facility] OR;  Service: Vascular;  Laterality: Left;   BIOPSY  07/27/2019   Procedure: BIOPSY;  Surgeon: Vida Rigger, MD;  Location: [facility];  Service: Endoscopy;;   BIOPSY  07/30/2019   Procedure: BIOPSY;  Surgeon: Kathi Der, MD;  Location: [facility];  Service: Gastroenterology;;   CATARACT EXTRACTION  01/2014   CHOLECYSTECTOMY     COLONOSCOPY WITH PROPOFOL N/A 07/30/2019   Procedure: COLONOSCOPY WITH PROPOFOL;  Surgeon: Kathi Der, MD;  Location: [facility];  Service: Gastroenterology;  Laterality: N/A;   ESOPHAGOGASTRODUODENOSCOPY N/A 07/27/2019   Procedure: ESOPHAGOGASTRODUODENOSCOPY (EGD);  Surgeon: Vida Rigger, MD;  Location: [facility];  Service: Endoscopy;  Laterality: N/A;   ESOPHAGOGASTRODUODENOSCOPY N/A 07/26/2020   Procedure: ESOPHAGOGASTRODUODENOSCOPY (EGD);  Surgeon: Willis Modena, MD;  Location: [facility];  Service: Endoscopy;  Laterality: N/A;   ESOPHAGOGASTRODUODENOSCOPY (  EGD) WITH PROPOFOL N/A 08/02/2019   Procedure: ESOPHAGOGASTRODUODENOSCOPY (EGD) WITH PROPOFOL;  Surgeon: Kathi Der, MD;  Location: [facility];  Service: Gastroenterology;  Laterality: N/A;   ESOPHAGOGASTRODUODENOSCOPY (EGD) WITH PROPOFOL N/A 12/23/2022   Procedure: ESOPHAGOGASTRODUODENOSCOPY (EGD) WITH PROPOFOL;  Surgeon: Shellia Cleverly, DO;  Location: [facility];   Service: Gastroenterology;  Laterality: N/A;   FISTULA SUPERFICIALIZATION Left 12/31/2022   Procedure: LEFT ARM FISTULA TRANSPOSITION;  Surgeon: Maeola Harman, MD;  Location: [facility] OR;  Service: Vascular;  Laterality: Left;   GIVENS CAPSULE STUDY N/A 12/23/2022   Procedure: GIVENS CAPSULE STUDY;  Surgeon: Shellia Cleverly, DO;  Location: [facility];  Service: Gastroenterology;  Laterality: N/A;   HEMOSTASIS CLIP PLACEMENT  08/02/2019   Procedure: HEMOSTASIS CLIP PLACEMENT;  Surgeon: Kathi Der, MD;  Location: [facility];  Service: Gastroenterology;;   HOT HEMOSTASIS N/A 12/23/2022   Procedure: HOT HEMOSTASIS (ARGON PLASMA COAGULATION/BICAP);  Surgeon: Shellia Cleverly, DO;  Location: [facility] ENDOSCOPY;  Service: Gastroenterology;  Laterality: N/A;   IR FLUORO GUIDE CV LINE RIGHT  06/24/2022   IR US GUIDE VASC ACCESS RIGHT  06/24/2022   POLYPECTOMY  07/30/2019   Procedure: POLYPECTOMY;  Surgeon: Kathi Der, MD;  Location: [facility];  Service: Gastroenterology;;   POLYPECTOMY  08/02/2019   Procedure: POLYPECTOMY;  Surgeon: Kathi Der, MD;  Location: [facility];  Service: Gastroenterology;;   Social History   Socioeconomic History   Marital status: Married    Spouse name: Not on file   Number of children: Not on file   Years of education: Not on file   Highest education level: Not on file  Occupational History   Not on file  Tobacco Use   Smoking status: Never   Smokeless tobacco: Never  Vaping Use   Vaping status: Never Used  Substance and Sexual Activity   Alcohol use: No   Drug use: No   Sexual activity: Not Currently    Birth control/protection: None  Other Topics Concern   Not on file  Social History Narrative   ** Merged History Encounter **       Social Determinants of Health   Financial Resource Strain: Low Risk (03/23/2021)   Received from [facility])   Financial Resource Strain  Food Insecurity: High Risk  (01/25/2023)   Received from [facility]   Hunger Vital Sign    Worried About Running Out of Food in the Last Year: Patient declined to answer    Ran Out of Food in the Last Year: Often true  Transportation Needs: Unmet Transportation Needs (01/25/2023)   Received from [employer]    In the past 12 months, has lack of reliable transportation kept you from medical appointments, meetings, work or from getting things needed for daily living? : Yes  Physical Activity: Not on File (10/31/2017)   Received from [place], [place]   Physical Activity    Physical Activity: 0  Stress: Low Risk (03/23/2021)   Received [facility])   Stress    Over the last 2 weeks, how often have you been bothered by the following problems: feeling nervous, anxious, on edge?: Not at all    Over the last 2 weeks, how often have you been bothered by the following problems: Not being able to stop or control worrying?: Not at all  Social Connections: Unknown (07/19/2021)   [facility]   Social Network    Social  Network: Not on file   Family History  Problem Relation Age of Onset   Diabetes Mother    Diabetes Father    Heart disease Father    Diabetes Sister    Congestive Heart Failure Sister 1   Diabetes Brother    - negative except otherwise stated in the family history section Allergies  Allergen Reactions   Isovue [Iopamidol] Anaphylaxis, [name] and Other (See Comments)    11/28/17 Patient had seizure like activity and then 1 min code after 100 cc of isovue 300. Possible contrast allergy vs vasovagal episode  Cardiac Arrest   Neurontin [Gabapentin] [name] and Swelling   Nsaids Anaphylaxis and Other (See Comments)    Hx of stomach ulcers   Penicillins Itching, Palpitations and Other (See Comments)    Flushing (Red Skin) Laryngeal Edema   Reglan [Metoclopramide] Other (See  Comments)    Tardive dyskinesia    Valium [Diazepam] [name]   Zestril [Lisinopril] Anaphylaxis and Swelling    Tongue and mouth swelling Laryngeal Edema   Tolectin [Tolmetin] Nausea And Vomiting, Nausea Only and Other (See Comments)    Irritates stomach ulcer   Asa [Aspirin] Other (See Comments)    Hx of stomach ulcer   Aspartame And Phenylalanine Hives   Bentyl [Dicyclomine] Other (See Comments)    Chest pain   Hibiclens [Chlorhexidine Gluconate] Other (See Comments)    Dermatitis    Flexeril [Cyclobenzaprine] Palpitations   Oxycontin [Oxycodone] Palpitations   Rifamycins Palpitations   Tylenol [Acetaminophen] Nausea And Vomiting, Nausea Only and Other (See Comments)    Irritates stomach ulcer Abdominal pain   Ultram [Tramadol] Nausea And Vomiting and Palpitations   Prior to Admission medications   Medication Sig Start Date End Date Taking? Authorizing Provider  albuterol (PROVENTIL) (2.5 MG/3ML) 0.083% nebulizer solution Take 3 mLs (2.5 mg total) by nebulization every 6 (six) hours as needed for wheezing or shortness of breath. 04/06/19  Yes Bing Neighbors, NP  allopurinol (ZYLOPRIM) 100 MG tablet Take 1 tablet (100 mg total) by mouth 2 (two) times daily. 11/30/22  Yes Azucena Fallen, MD  Amino Acids-Protein Hydrolys (FEEDING SUPPLEMENT, PRO-STAT SUGAR FREE 64,) LIQD Take 30 mLs by mouth 2 (two) times daily between meals.   Yes [provider]  ascorbic acid (VITAMIN C) 500 MG tablet Take 500 mg by mouth daily.   Yes [provider]  atorvastatin (LIPITOR) 10 MG tablet Take 10 mg by mouth every evening. 12/03/21  Yes [provider]  busPIRone (BUSPAR) 5 MG tablet Take 5 mg by mouth 3 (three) times daily.   Yes [provider]  cefdinir (OMNICEF) 300 MG capsule Take 1 capsule (300 mg total) by mouth daily. 01/21/23  Yes Linwood Dibbles, MD  cetirizine (ZYRTEC) 10 MG tablet Take 10 mg by mouth daily. 08/05/22  Yes [provider]  famotidine (PEPCID) 20 MG tablet Take 20 mg by mouth daily before breakfast.   Yes [provider]  ferric citrate (AURYXIA) 1 GM 210 MG(Fe) tablet Take 2 tablets (420 mg total) by mouth 3 (three) times daily with meals. Patient taking differently: Take 210 mg by mouth 3 (three) times daily with meals. 11/30/22  Yes Azucena Fallen, MD  fluticasone [facility]) 50 MCG/ACT nasal spray Place 2 sprays into both nostrils daily as needed for allergies or rhinitis. 12/19/18  Yes Rai, Ripudeep K, MD  folic acid (FOLVITE) 1 MG tablet Take 1 tablet (1 mg total) by  mouth daily. 01/10/23  Yes Danford, Earl Lites, MD  gabapentin (NEURONTIN) 100 MG capsule Take 1 capsule (100 mg total) by mouth 3 (three) times daily. 11/30/22  Yes Azucena Fallen, MD  HYDROmorphone (DILAUDID) 4 MG tablet Take 1 tablet (4 mg total) by mouth in the morning, at noon, in the evening, and at bedtime. Patient taking differently: Take 4 mg by mouth See admin instructions. Give 4 mg (1 tablet) three times daily for pain. Give first dose at 0600 on dialysis days (T-Th-Sat), otherwise give first dose at 0900. May give an additional tablet every 24 hours as needed for severe pain (do not administer within 3 hours of scheduled dose). 01/10/23  Yes Danford, Earl Lites, MD  hydrOXYzine (ATARAX) 25 MG tablet Take 1 tablet (25 mg total) by mouth 3 (three) times daily as needed for anxiety. Patient taking differently: Take 25 mg by mouth See admin instructions. Give 25 mg (1 tablet) every 6 hours as needed for anxiety, agitation for 14 days (starting 11/19), then every 8 hours as needed. 01/10/23  Yes Danford, Earl Lites, MD  insulin glargine (SEMGLEE, YFGN,) 100 UNIT/ML Solostar Pen Inject 8 Units into the skin at bedtime.   Yes [provider]  insulin lispro (HUMALOG) 100 UNIT/ML KwikPen Before each meal 3 times a day, 140-199 - 2 units, 200-250 - 6 units, 251-299 - 8 units,  300-349 - 12 units,  350 or  above 14 units. Patient taking differently: Inject 0-14 Units into the skin See admin instructions. Inject 0-14 units per sliding scale before meals: < 70 notify MD 70-139 : 0 units 140-199 : 2 units 200-250 : 6 units 251-299 : 8 units 300-349 : 12 units 350-400 : 14 units > 401 notify MD. 06/04/22  Yes Leroy Sea, MD  loperamide (IMODIUM) 2 MG capsule Take 1 capsule (2 mg total) by mouth as needed for diarrhea or loose stools. Patient taking differently: Take 2 mg by mouth daily as needed for diarrhea or loose stools. 01/10/23  Yes Danford, Earl Lites, MD  methocarbamol (ROBAXIN) 500 MG tablet Take 1 tablet (500 mg total) by mouth every 6 (six) hours as needed for muscle spasms. 01/10/23  Yes Danford, Earl Lites, MD  metoprolol tartrate (LOPRESSOR) 50 MG tablet Take 1 tablet (50 mg total) by mouth 2 (two) times daily. 01/10/23  Yes Danford, Earl Lites, MD  mirtazapine (REMERON) 15 MG tablet Take 15 mg by mouth at bedtime. 10/18/22  Yes [provider]  Multiple Vitamins-Minerals (SENIOR MULTIVITAMIN PLUS PO) Take 1 tablet by mouth daily.   Yes [provider]  nutrition supplement, JUVEN, (JUVEN) PACK Take 1 packet by mouth 2 (two) times daily between meals. 01/10/23  Yes Danford, Earl Lites, MD  Nutritional Supplement LIQD Take 120-237 mLs by mouth See admin instructions. House supplement. Give 120 mL in the morning at 0900 and 237 mL twice daily between meals (1000, 1400)   Yes [provider]  Nutritional Supplements (,FEEDING SUPPLEMENT, PROSOURCE PLUS) liquid Take 30 mLs by mouth 2 (two) times daily between meals. 01/10/23  Yes Danford, Earl Lites, MD  pantoprazole (PROTONIX) 40 MG tablet Take 40 mg by mouth 2 (two) times daily.   Yes [provider]  zinc sulfate 220 (50 Zn) MG capsule Take 1 capsule (220 mg total) by mouth daily. 01/10/23  Yes Danford, Earl Lites, MD  colchicine 0.6 MG tablet Take 0.5 tablets (0.3 mg total) by mouth 2  (two) times a week. Patient not taking:  Reported on 01/29/2023 12/02/22   Azucena Fallen, MD   No results found.   Positive ROS: All other systems have been reviewed and were otherwise negative with the exception of those mentioned in the HPI and as above.  Physical Exam: General: No acute distress  MUSCULOSKELETAL:  Right lower extremity with dressing in place, bandages are currently clean dry and intact at the stump site.  Pictures are available in the media tab, reviewed in detail which do show significant wound dehiscence at the amputation site.  Left lower extremity also with dressing in place for the left heel ulcer, has been managing with dry dressing changes daily.  There was concern previously that limb salvage on the left is approximate 50% per most recent note with Dr. Lajoyce Corners.  Independent Imaging Review: Images of bilateral lower extremity sites available in media tab, reviewed in detail  Assessment: 57 year old female presents today with significant wound dehiscence at the prior right lower extremity below-knee amputation site.  Plan: Continue with IV antibiotic therapy Dressing changes as needed to right lower extremity Left lower extremity continue daily dressing changes, dry dressings Discussed with Dr. Lajoyce Corners, likely revision of the right lower extremity amputation site on Wednesday this week.    Thank you for the consult and the opportunity to see Ms. Magnus Ivan Fara Boros) Denese Killings, MD [facility]

## 2023-01-30 NOTE — Plan of Care (Signed)
  Problem: Education: Goal: Knowledge of General Education information will improve Description: Including pain rating scale, medication(s)/side effects and non-pharmacologic comfort measures Outcome: Progressing   Problem: Health Behavior/Discharge Planning: Goal: Ability to manage health-related needs will improve Outcome: Progressing   Problem: Clinical Measurements: Goal: Ability to maintain clinical measurements within normal limits will improve Outcome: Progressing Goal: Diagnostic test results will improve Outcome: Progressing Goal: Respiratory complications will improve Outcome: Progressing   Problem: Activity: Goal: Risk for activity intolerance will decrease Outcome: Progressing   Problem: Coping: Goal: Level of anxiety will decrease Outcome: Progressing   Problem: Pain Management: Goal: General experience of comfort will improve Outcome: Progressing

## 2023-01-30 NOTE — Plan of Care (Signed)
Problem: Skin Integrity: Goal: Risk for impaired skin integrity will decrease Outcome: Progressing

## 2023-01-30 NOTE — Progress Notes (Signed)
MEWS Progress Note  Patient Details Name: Deborah [name] MRN: [id number] DOB: 1965-11-05 Today's Date: 01/30/2023   MEWS Flowsheet Documentation:  Assess: MEWS Score Temp: 97.8 F (36.6 C) BP: 106/67 MAP (mmHg): 80 Pulse Rate: (!) 124 ECG Heart Rate: (!) 121 Resp: 20 Level of Consciousness: Alert SpO2: 100 % O2 Device: Room Air Patient Activity (if Appropriate): In bed Assess: MEWS Score MEWS Temp: 0 MEWS Systolic: 0 MEWS Pulse: 2 MEWS RR: 0 MEWS LOC: 0 MEWS Score: 2 MEWS Score Color: Yellow Assess: SIRS CRITERIA SIRS Temperature : 0 SIRS Respirations : 0 SIRS Pulse: 1 SIRS WBC: 0 SIRS Score Sum : 1 SIRS Temperature : 0 SIRS Pulse: 1 SIRS Respirations : 0 SIRS WBC: 0 SIRS Score Sum : 1 Assess: if the MEWS score is Yellow or Red Were vital signs accurate and taken at a resting state?: Yes Does the patient meet 2 or more of the SIRS criteria?: No Does the patient have a confirmed or suspected source of infection?: Yes MEWS guidelines implemented : Yes, yellow Treat MEWS Interventions: Considered administering scheduled or prn medications/treatments as ordered Take Vital Signs Increase Vital Sign Frequency : Yellow: Q2hr x1, continue Q4hrs until patient remains green for 12hrs Escalate MEWS: Escalate: Yellow: Discuss with charge nurse and consider notifying provider and/or RRT        Elvera Maria 01/30/2023, 7:22 PM

## 2023-01-30 NOTE — Progress Notes (Signed)
PROGRESS NOTE        PATIENT DETAILS Name: Deborah [name] Age: 57 y.o. Sex: female Date of Birth: Jul 13, 1965 Admit Date: 01/29/2023 Admitting Physician Meredeth Ide, MD PCP:Pcp, No  Brief Summary: Patient is a 57 y.o.  female with history of ESRD on HD MWF, PAF-not on any anticoagulation due to recent GI bleeding, HFpEF, DM-2, HTN, chronic pain syndrome on narcotics-who underwent BKA 10/23-presented with a right BKA stump dehiscence.  Significant events: 11/23>> admit to [facility].  Significant studies: None  Significant microbiology data: 11/23>> COVID/influenza/RSV PCR: Negative 11/24>> blood culture: No growth  Procedures: None  Consults: Orthopedics  Subjective: Lying comfortably in bed-denies any chest pain or shortness of breath.  Complains of chronic pain-asking for narcotics.  Objective: Vitals: Blood pressure (!) 131/95, pulse (!) 109, temperature 98.2 F (36.8 C), temperature source Oral, resp. rate 17, height 5\' 6"  (1.676 m), weight 113.4 kg, last menstrual period 10/10/2012, SpO2 98%.   Exam: Gen Exam:Alert awake-not in any distress HEENT:atraumatic, normocephalic Chest: B/L clear to auscultation anteriorly CVS:S1S2 regular Abdomen:soft non tender, non distended Extremities: Right BKA stump in dressing. Neurology: Non focal Skin: no rash  Pertinent Labs/Radiology:    Latest Ref Rng & Units 01/30/2023    4:34 AM 01/29/2023   11:20 PM 01/29/2023    2:30 PM  CBC  WBC 4.0 - 10.5 K/uL 15.2  15.7  18.7   Hemoglobin 12.0 - 15.0 g/dL 7.4  8.4  7.8   Hematocrit 36.0 - 46.0 % 24.1  27.7  25.7   Platelets 150 - 400 K/uL 398  332  425     Lab Results  Component Value Date   NA 139 01/30/2023   K 3.4 (L) 01/30/2023   CL 107 01/30/2023   CO2 21 (L) 01/30/2023      Assessment/Plan: Sepsis due to right BKA stump infection with wound dehiscence (recent right calcaneal osteomyelitis requiring BKA 10/23) Sepsis physiology  improved Continue broad-spectrum antibiotics Await cultures Await formal evaluation by orthopedics for timing of BKA revision.  ESRD on HD MWF Missed HD Wednesday/Friday Nephrology following and directing care  Hypokalemia Replete/recheck  Normocytic anemia Secondary to ESRD-recent history of GI bleeding No overt bleeding noted Iron/iron deferred to nephrology service Follow CBC  History of GI bleeding October 2024-thought to be due to AVMs noted on EGD PPI Follow CBC No overt GI bleeding noted  History of NASH Supportive care  PAF Telemetry monitoring No longer on anticoagulation due to recent GI bleeding Metoprolol  Chronic HFpEF Volume status stable Volume removal with HD  DM-2 (A1c 8.1 on 4/13) Start SSI/follow CBG  No results for input(s): "GLUCAP" in the last 72 hours.   Chronic pain syndrome Continue narcotics-on Dilaudid at home-follows with pain management in the outpatient setting.  History of malignant duodenal carcinoid-s/p resection Stable for continued outpatient follow-up with oncology/PCP  History of tardive dyskinesia Supportive care  Anxiety/mood disorder Resume Remeron/BuSpar As needed Atarax  Debility/deconditioning PT/OT eval  Morbid Obesity: Estimated body mass index is 40.35 kg/m as calculated from the following:   Height as of this encounter: 5\' 6"  (1.676 m).   Weight as of this encounter: 113.4 kg.   Code status:   Code Status: Full Code   DVT Prophylaxis: heparin injection 5,000 Units Start: 01/29/23 2200   Family Communication: None at bedside   Disposition Plan:  Status is: Inpatient Remains inpatient appropriate because: Severity of illness   Planned Discharge Destination:Home   Diet: Diet Order             Diet Carb Modified Fluid consistency: Thin; Room service appropriate? Yes  Diet effective now                     Antimicrobial agents: Anti-infectives (From admission, onward)    Start      Dose/Rate Route Frequency Ordered Stop   01/30/23 1800  ceFEPIme (MAXIPIME) 1 g in sodium chloride 0.9 % 100 mL IVPB        1 g 200 mL/hr over 30 Minutes Intravenous Every 24 hours 01/29/23 1705     01/29/23 1945  vancomycin (VANCOCIN) IVPB 1000 mg/200 mL premix        1,000 mg 200 mL/hr over 60 Minutes Intravenous Every 1 hr x 2 01/29/23 1846 01/29/23 2205   01/29/23 1930  vancomycin (VANCOREADY) IVPB 2000 mg/400 mL  Status:  Discontinued        2,000 mg 200 mL/hr over 120 Minutes Intravenous  Once 01/29/23 1840 01/29/23 1846   01/29/23 1630  vancomycin (VANCOCIN) IVPB 1000 mg/200 mL premix  Status:  Discontinued       Placed in "Followed by" Linked Group   1,000 mg 200 mL/hr over 60 Minutes Intravenous  Once 01/29/23 1512 01/29/23 1944   01/29/23 1515  ceFEPIme (MAXIPIME) 1 g in sodium chloride 0.9 % 100 mL IVPB        1 g 200 mL/hr over 30 Minutes Intravenous  Once 01/29/23 1512 01/29/23 1839   01/29/23 1515  metroNIDAZOLE (FLAGYL) IVPB 500 mg        500 mg 100 mL/hr over 60 Minutes Intravenous  Once 01/29/23 1512 01/29/23 1736   01/29/23 1515  vancomycin (VANCOCIN) IVPB 1000 mg/200 mL premix  Status:  Discontinued       Placed in "Followed by" Linked Group   1,000 mg 200 mL/hr over 60 Minutes Intravenous  Once 01/29/23 1512 01/29/23 1945        MEDICATIONS: Scheduled Meds:  busPIRone  5 mg Oral TID   darbepoetin (ARANESP) injection - DIALYSIS  100 mcg Subcutaneous Q Sat-1800   ferric citrate  210 mg Oral TID WC   gabapentin  100 mg Oral TID   heparin  5,000 Units Subcutaneous Q8H   insulin aspart  0-6 Units Subcutaneous TID WC   metoprolol tartrate  50 mg Oral BID   mirtazapine  15 mg Oral QHS   pantoprazole  40 mg Oral Q1200   sodium chloride flush  3 mL Intravenous Q12H   Continuous Infusions:  sodium chloride     ceFEPime (MAXIPIME) IV     PRN Meds:.sodium chloride, acetaminophen **OR** acetaminophen, HYDROmorphone (DILAUDID) injection, hydrOXYzine, methocarbamol,  ondansetron **OR** ondansetron (ZOFRAN) IV, sodium chloride flush   I have personally reviewed following labs and imaging studies  LABORATORY DATA: CBC: Recent Labs  Lab 01/27/23 1355 01/29/23 1430 01/29/23 2320 01/30/23 0434  WBC 15.3* 18.7* 15.7* 15.2*  NEUTROABS 12.1* 14.1*  --   --   HGB 7.3* 7.8* 8.4* 7.4*  HCT 23.7* 25.7* 27.7* 24.1*  MCV 94.4 96.3 95.8 94.9  PLT 403* 425* 332 398    Basic Metabolic Panel: Recent Labs  Lab 01/27/23 1355 01/29/23 1430 01/29/23 2320 01/30/23 0434  NA 138 141  --  139  K 3.1* 2.8*  --  3.4*  CL 102 107  --  107  CO2 24 23  --  21*  GLUCOSE 123* 138*  --  103*  BUN 13 16  --  17  CREATININE 5.39* 6.45* 5.50* 5.91*  CALCIUM 8.3* 8.2*  --  7.9*  PHOS  --   --  4.4  --     GFR: Estimated Creatinine Clearance: 13.3 mL/min (A) (by C-G formula based on SCr of 5.91 mg/dL (H)).  Liver Function Tests: Recent Labs  Lab 01/27/23 1355 01/30/23 0434  AST 17 15  ALT 10 9  ALKPHOS 89 97  BILITOT 0.8 0.7  PROT 6.6 6.2*  ALBUMIN 2.0* 2.0*   Recent Labs  Lab 01/27/23 1355  LIPASE 21   No results for input(s): "AMMONIA" in the last 168 hours.  Coagulation Profile: No results for input(s): "INR", "PROTIME" in the last 168 hours.  Cardiac Enzymes: No results for input(s): "CKTOTAL", "CKMB", "CKMBINDEX", "TROPONINI" in the last 168 hours.  BNP (last 3 results) No results for input(s): "PROBNP" in the last 8760 hours.  Lipid Profile: No results for input(s): "CHOL", "HDL", "LDLCALC", "TRIG", "CHOLHDL", "LDLDIRECT" in the last 72 hours.  Thyroid Function Tests: No results for input(s): "TSH", "T4TOTAL", "FREET4", "T3FREE", "THYROIDAB" in the last 72 hours.  Anemia Panel: No results for input(s): "VITAMINB12", "FOLATE", "FERRITIN", "TIBC", "IRON", "RETICCTPCT" in the last 72 hours.  Urine analysis:    Component Value Date/Time   COLORURINE STRAW (A) 06/19/2022 0638   APPEARANCEUR HAZY (A) 06/19/2022 0638   LABSPEC 1.010  06/19/2022 0638   PHURINE 5.0 06/19/2022 0638   GLUCOSEU NEGATIVE 06/19/2022 0638   HGBUR SMALL (A) 06/19/2022 0638   BILIRUBINUR NEGATIVE 06/19/2022 0638   KETONESUR NEGATIVE 06/19/2022 0638   PROTEINUR 30 (A) 06/19/2022 0638   UROBILINOGEN 0.2 10/02/2013 2108   NITRITE NEGATIVE 06/19/2022 0638   LEUKOCYTESUR LARGE (A) 06/19/2022 0638    Sepsis Labs: Lactic Acid, Venous    Component Value Date/Time   LATICACIDVEN 1.6 01/30/2023 0543    MICROBIOLOGY: Recent Results (from the past 240 hour(s))  Blood culture (routine x 2)     Status: None   Collection Time: 01/21/23  7:01 PM   Specimen: BLOOD  Result Value Ref Range Status   Specimen Description BLOOD SITE NOT SPECIFIED  Final   Special Requests BLOOD Blood Culture adequate volume  Final   Culture   Final    NO GROWTH 5 DAYS Performed at [facility], [address] [place], [place] [address]    Report Status 01/26/2023 FINAL  Final  Blood culture (routine x 2)     Status: None   Collection Time: 01/21/23  7:05 PM   Specimen: BLOOD  Result Value Ref Range Status   Specimen Description BLOOD RIGHT ANTECUBITAL  Final   Special Requests   Final    BOTTLES DRAWN AEROBIC AND ANAEROBIC Blood Culture results may not be optimal due to an inadequate volume of blood received in culture bottles   Culture   Final    NO GROWTH 5 DAYS Performed at [facility], [address], [place], [place] [address]    Report Status 01/26/2023 FINAL  Final  Blood culture (routine x 2)     Status: None (Preliminary result)   Collection Time: 01/29/23  2:19 PM   Specimen: BLOOD  Result Value Ref Range Status   Specimen Description BLOOD SITE NOT SPECIFIED  Final   Special Requests   Final    BOTTLES DRAWN AEROBIC AND ANAEROBIC Blood Culture adequate  volume   Culture   Final    NO GROWTH < 24 HOURS Performed at [facility], [address] [place], [place] [address]    Report Status PENDING  Incomplete  Resp panel by  RT-PCR (RSV, Flu A&B, Covid) Anterior Nasal Swab     Status: None   Collection Time: 01/29/23  3:05 PM   Specimen: Anterior Nasal Swab  Result Value Ref Range Status   SARS Coronavirus 2 by RT PCR NEGATIVE NEGATIVE Final   Influenza A by PCR NEGATIVE NEGATIVE Final   Influenza B by PCR NEGATIVE NEGATIVE Final    Comment: (NOTE) The Xpert Xpress SARS-CoV-2/FLU/RSV plus assay is intended as an aid in the diagnosis of influenza from Nasopharyngeal swab specimens and should not be used as a sole basis for treatment. Nasal washings and aspirates are unacceptable for Xpert Xpress SARS-CoV-2/FLU/RSV testing.  Fact Sheet for Patients: BloggerCourse.com  Fact Sheet for Healthcare Providers: SeriousBroker.it  This test is not yet approved or cleared by the [place] FDA and has been authorized for detection and/or diagnosis of SARS-CoV-2 by FDA under an Emergency Use Authorization (EUA). This EUA will remain in effect (meaning this test can be used) for the duration of the COVID-19 declaration under Section 564(b)(1) of the Act, 21 U.S.C. section 360bbb-3(b)(1), unless the authorization is terminated or revoked.     Resp Syncytial Virus by PCR NEGATIVE NEGATIVE Final    Comment: (NOTE) Fact Sheet for Patients: BloggerCourse.com  Fact Sheet for Healthcare Providers: SeriousBroker.it  This test is not yet approved or cleared by the [place] FDA and has been authorized for detection and/or diagnosis of SARS-CoV-2 by FDA under an Emergency Use Authorization (EUA). This EUA will remain in effect (meaning this test can be used) for the duration of the COVID-19 declaration under Section 564(b)(1) of the Act, 21 U.S.C. section 360bbb-3(b)(1), unless the authorization is terminated or revoked.  Performed at [facility], [address], [place], [place] [address]   Blood  culture (routine x 2)     Status: None (Preliminary result)   Collection Time: 01/30/23  4:36 AM   Specimen: BLOOD RIGHT HAND  Result Value Ref Range Status   Specimen Description BLOOD RIGHT HAND  Final   Special Requests   Final    BOTTLES DRAWN AEROBIC ONLY Blood Culture results may not be optimal due to an inadequate volume of blood received in culture bottles   Culture   Final    NO GROWTH < 12 HOURS Performed at [facility], [address] [place], [place] [address]    Report Status PENDING  Incomplete    RADIOLOGY STUDIES/RESULTS: No results found.   LOS: 1 day   Jeoffrey Massed, MD  [facility]    To contact the attending provider between 7A-7P or the covering provider during after hours 7P-7A, please log into the web site [email or web address] and access using universal [place] password for that web site. If you do not have the password, please call the hospital operator.  01/30/2023, 9:30 AM

## 2023-01-30 NOTE — Progress Notes (Signed)
[name] Progress Note   Subjective:   Pt reports he leg is sore and she cannot stop shaking, she also reports feeling anxious. Temp WNL. Denies SOB, CP, dizziness, nausea. On the schedule for HD today.   Objective Vitals:   01/29/23 2306 01/30/23 0000 01/30/23 0451 01/30/23 0827  BP: 111/62   (!) 131/95  Pulse: (!) 108  (!) 102 (!) 109  Resp: 20 16  17   Temp: 98 F (36.7 C)  98.2 F (36.8 C) 98.2 F (36.8 C)  TempSrc: Oral  Oral Oral  SpO2: 98%  98%   Weight:      Height:       Physical Exam General: Alert female, + shaking Heart: RRR, no murmurs, rubs or gallops Lungs: CTA bilaterally, respirations unlabored Abdomen: Soft, non-distended, +BS Extremities: No edema b/l lower extremities Dialysis Access:  [facility] with intact bandage  Additional Objective Labs: Basic Metabolic Panel: Recent Labs  Lab 01/27/23 1355 01/29/23 1430 01/29/23 2320 01/30/23 0434  NA 138 141  --  139  K 3.1* 2.8*  --  3.4*  CL 102 107  --  107  CO2 24 23  --  21*  GLUCOSE 123* 138*  --  103*  BUN 13 16  --  17  CREATININE 5.39* 6.45* 5.50* 5.91*  CALCIUM 8.3* 8.2*  --  7.9*  PHOS  --   --  4.4  --    Liver Function Tests: Recent Labs  Lab 01/27/23 1355 01/30/23 0434  AST 17 15  ALT 10 9  ALKPHOS 89 97  BILITOT 0.8 0.7  PROT 6.6 6.2*  ALBUMIN 2.0* 2.0*   Recent Labs  Lab 01/27/23 1355  LIPASE 21   CBC: Recent Labs  Lab 01/27/23 1355 01/29/23 1430 01/29/23 2320 01/30/23 0434  WBC 15.3* 18.7* 15.7* 15.2*  NEUTROABS 12.1* 14.1*  --   --   HGB 7.3* 7.8* 8.4* 7.4*  HCT 23.7* 25.7* 27.7* 24.1*  MCV 94.4 96.3 95.8 94.9  PLT 403* 425* 332 398   Blood Culture    Component Value Date/Time   SDES BLOOD RIGHT HAND 01/30/2023 0436   SPECREQUEST  01/30/2023 0436    BOTTLES DRAWN AEROBIC ONLY Blood Culture results may not be optimal due to an inadequate volume of blood received in culture bottles   CULT  01/30/2023 0436    NO GROWTH < 12 HOURS Performed at  [facility], [address] [place], [place] [address]    REPTSTATUS PENDING 01/30/2023 [phone]    Cardiac Enzymes: No results for input(s): "CKTOTAL", "CKMB", "CKMBINDEX", "TROPONINI" in the last 168 hours. CBG: No results for input(s): "GLUCAP" in the last 168 hours. Iron Studies: No results for input(s): "IRON", "TIBC", "TRANSFERRIN", "FERRITIN" in the last 72 hours. @lablastinr3 @ Studies/Results: No results found. Medications:  sodium chloride     ceFEPime (MAXIPIME) IV      busPIRone  5 mg Oral TID   darbepoetin (ARANESP) injection - DIALYSIS  100 mcg Subcutaneous Q Sat-1800   ferric citrate  210 mg Oral TID WC   gabapentin  100 mg Oral TID   Gerhardt's butt cream   Topical TID   heparin  5,000 Units Subcutaneous Q8H   insulin aspart  0-6 Units Subcutaneous TID WC   metoprolol tartrate  50 mg Oral BID   mirtazapine  15 mg Oral QHS   pantoprazole  40 mg Oral BID   sodium chloride flush  3 mL Intravenous Q12H    Dialysis  Orders:  MWF NW  4h  400/800  121kg   3K/2.5Ca bath   TDC   Heparin none - last OP HD 11/18, post wt 124.6kg  - has been usually reaching dry wt - missed 2 sessions in last 3 wks 11/20 and 11/13 - was in hospital 10/9- 11/04 here  - dry wt lowered 6 kg 2 wks ago - venofer 100mg  q hd thru 12/04 - mircera 100 mcg q 2 wks, last 10/5, due 10/19     Labs --> Na 141  K 2.8  BUN 16,  creat 6.45    wBC 18  hb 7.8  Assessment/Plan: Sepsis - presenting w/ SIRS, due to dehisced [name]. IV abx started and ortho consulted. Per pmd.  ESKD - on HD MWF. Missed HD due to illness as above. Planned for HD today, due to the holiday her schedule will be Sunday, Tuesday, Friday this week.  HTN - bp's wnl, takes metoprolol at home. Cont meds as needed.  Volume - no gross vol excess on exam. Plan UF 2.5 L w/ HD  Anemia of eskd - Hb 7- 8 here. Overdue for esa, will start darbe here at 100 mcg weekly. Tranfuse prn.  MBD ckd - CCa in range. Phos at goal. Continue  binders.   Rogers Blocker, PA-C 01/30/2023, 10:58 AM  [facility] Pager: [phone]

## 2023-01-30 NOTE — Progress Notes (Signed)
   01/30/23 1600  Vitals  Temp 97.8 F (36.6 C)  Pulse Rate (!) 124  Resp 20  BP 109/77  SpO2 100 %  O2 Device Room Air  Weight 117.2 kg  Type of Weight Post-Dialysis  Post Treatment  Dialyzer Clearance Lightly streaked  Hemodialysis Intake (mL) 0 mL  Liters Processed 63  Fluid Removed (mL) 2500 mL  Tolerated HD Treatment Yes   Received patient in bed to unit.  Alert and oriented.  Informed consent signed and in chart.   TX duration:3hrs  Patient tolerated well.  Transported back to the room  Alert, without acute distress.  Hand-off given to patient's nurse.   Access used: [facility] Access issues: none  Total UF removed: 2.5L Medication(s) given: atarax    Na'Shaminy T Lashawnda [facility]

## 2023-01-31 ENCOUNTER — Inpatient Hospital Stay (HOSPITAL_COMMUNITY): Payer: 59

## 2023-01-31 DIAGNOSIS — T8781 Dehiscence of amputation stump: Secondary | ICD-10-CM

## 2023-01-31 DIAGNOSIS — T8130XA Disruption of wound, unspecified, initial encounter: Secondary | ICD-10-CM | POA: Diagnosis not present

## 2023-01-31 DIAGNOSIS — N186 End stage renal disease: Secondary | ICD-10-CM | POA: Diagnosis not present

## 2023-01-31 DIAGNOSIS — Z5189 Encounter for other specified aftercare: Secondary | ICD-10-CM | POA: Diagnosis not present

## 2023-01-31 DIAGNOSIS — T148XXA Other injury of unspecified body region, initial encounter: Secondary | ICD-10-CM | POA: Diagnosis not present

## 2023-01-31 DIAGNOSIS — Z89511 Acquired absence of right leg below knee: Secondary | ICD-10-CM

## 2023-01-31 LAB — CBC
HCT: 23.1 % — ABNORMAL LOW (ref 36.0–46.0)
Hemoglobin: 6.8 g/dL — CL (ref 12.0–15.0)
MCH: 28.3 pg (ref 26.0–34.0)
MCHC: 29.4 g/dL — ABNORMAL LOW (ref 30.0–36.0)
MCV: 96.3 fL (ref 80.0–100.0)
Platelets: 342 10*3/uL (ref 150–400)
RBC: 2.4 MIL/uL — ABNORMAL LOW (ref 3.87–5.11)
RDW: 15.5 % (ref 11.5–15.5)
WBC: 14.3 10*3/uL — ABNORMAL HIGH (ref 4.0–10.5)
nRBC: 0 % (ref 0.0–0.2)

## 2023-01-31 LAB — RENAL FUNCTION PANEL
Albumin: 1.8 g/dL — ABNORMAL LOW (ref 3.5–5.0)
Anion gap: 7 (ref 5–15)
BUN: 11 mg/dL (ref 6–20)
CO2: 27 mmol/L (ref 22–32)
Calcium: 7.9 mg/dL — ABNORMAL LOW (ref 8.9–10.3)
Chloride: 101 mmol/L (ref 98–111)
Creatinine, Ser: 4.09 mg/dL — ABNORMAL HIGH (ref 0.44–1.00)
GFR, Estimated: 12 mL/min — ABNORMAL LOW (ref >60)
Glucose, Bld: 125 mg/dL — ABNORMAL HIGH (ref 70–99)
Phosphorus: 3.4 mg/dL (ref 2.5–4.6)
Potassium: 3.4 mmol/L — ABNORMAL LOW (ref 3.5–5.1)
Sodium: 135 mmol/L (ref 135–145)

## 2023-01-31 LAB — GLUCOSE, CAPILLARY
Glucose-Capillary: 109 mg/dL — ABNORMAL HIGH (ref 70–99)
Glucose-Capillary: 252 mg/dL — ABNORMAL HIGH (ref 70–99)
Glucose-Capillary: 192 mg/dL — ABNORMAL HIGH (ref 70–99)
Glucose-Capillary: 165 mg/dL — ABNORMAL HIGH (ref 70–99)
Glucose-Capillary: 176 mg/dL — ABNORMAL HIGH (ref 70–99)

## 2023-01-31 LAB — PREPARE RBC (CROSSMATCH)

## 2023-01-31 LAB — HEMOGLOBIN AND HEMATOCRIT, BLOOD
HCT: 24.8 % — ABNORMAL LOW (ref 36.0–46.0)
Hemoglobin: 7.6 g/dL — ABNORMAL LOW (ref 12.0–15.0)

## 2023-01-31 MED ORDER — SODIUM CHLORIDE 0.9% IV SOLUTION
Freq: Once | INTRAVENOUS | Status: DC
Start: 2023-01-31 — End: 2023-02-11

## 2023-01-31 MED ORDER — ACETAMINOPHEN 325 MG PO TABS
650.0000 mg | ORAL_TABLET | Freq: Once | ORAL | Status: AC
  Administered 2023-01-31: 650 mg via ORAL
  Filled 2023-01-31: qty 2

## 2023-01-31 MED ORDER — SODIUM CHLORIDE 0.9 % IV SOLN
2.0000 g | Freq: Every day | INTRAVENOUS | Status: AC
  Administered 2023-01-31 – 2023-02-02 (×3): 2 g via INTRAVENOUS
  Filled 2023-01-31 (×6): qty 20

## 2023-01-31 MED ORDER — POLYETHYLENE GLYCOL 3350 17 G PO PACK: 17.0000 g | PACK | Freq: Every day | ORAL | Status: DC

## 2023-01-31 MED ORDER — BISACODYL 10 MG RE SUPP: 10.0000 mg | Freq: Every day | RECTAL | Status: DC | PRN

## 2023-01-31 MED ORDER — SODIUM CHLORIDE 0.9% IV SOLUTION
Freq: Once | INTRAVENOUS | Status: AC
Start: 2023-01-31 — End: 2023-01-31

## 2023-01-31 MED ORDER — DIPHENHYDRAMINE HCL 25 MG PO CAPS
25.0000 mg | ORAL_CAPSULE | Freq: Once | ORAL | Status: AC
  Administered 2023-01-31: 25 mg via ORAL
  Filled 2023-01-31: qty 1

## 2023-01-31 MED ORDER — LORAZEPAM 2 MG/ML IJ SOLN: 1.0000 mg | INTRAMUSCULAR | Status: AC

## 2023-01-31 MED ORDER — SENNOSIDES-DOCUSATE SODIUM 8.6-50 MG PO TABS
2.0000 | ORAL_TABLET | Freq: Every day | ORAL | Status: DC
  Administered 2023-02-01: 2 via ORAL
  Filled 2023-01-31 (×5): qty 2

## 2023-01-31 MED ORDER — POTASSIUM CHLORIDE CRYS ER 20 MEQ PO TBCR
40.0000 meq | EXTENDED_RELEASE_TABLET | Freq: Once | ORAL | Status: AC
  Administered 2023-01-31: 40 meq via ORAL
  Filled 2023-01-31: qty 2

## 2023-01-31 MED ORDER — MIDODRINE HCL 5 MG PO TABS
10.0000 mg | ORAL_TABLET | Freq: Three times a day (TID) | ORAL | Status: DC
  Administered 2023-01-31 – 2023-02-15 (×42): 10 mg via ORAL
  Filled 2023-01-31 (×45): qty 2

## 2023-01-31 MED ORDER — HYDROMORPHONE HCL 1 MG/ML IJ SOLN
1.0000 mg | Freq: Once | INTRAMUSCULAR | Status: AC
  Administered 2023-01-31: 1 mg via INTRAVENOUS
  Filled 2023-01-31: qty 1

## 2023-01-31 MED ORDER — LORAZEPAM 2 MG/ML IJ SOLN
INTRAMUSCULAR | Status: AC
  Administered 2023-01-31: 1 mg via INTRAVENOUS
  Filled 2023-01-31: qty 1

## 2023-01-31 MED ORDER — LORAZEPAM 2 MG/ML IJ SOLN
1.0000 mg | Freq: Four times a day (QID) | INTRAMUSCULAR | Status: DC | PRN
  Administered 2023-02-01 – 2023-02-02 (×2): 1 mg via INTRAVENOUS
  Filled 2023-01-31 (×5): qty 1

## 2023-01-31 NOTE — Consult Note (Signed)
ORTHOPAEDIC CONSULTATION  REQUESTING PHYSICIAN: Maretta Bees, MD  Chief Complaint: Dehiscence right transtibial amputation.  HPI: Deborah [name] is a 57 y.o. female who presents with acute dehiscence right below-knee amputation.  Patient states she was changing her dressing and noted the wound dehiscence.  Patient has diabetic insensate neuropathy end-stage renal disease on dialysis.  Past Medical History:  Diagnosis Date   Acute back pain with sciatica, left    Acute back pain with sciatica, right    AKI (acute kidney injury) (HCC)    Anemia, unspecified    Cancer (HCC)    Carcinoid tumor of duodenum    Chest pain with normal coronary angiography 2019   Chronic kidney disease, stage 3b (HCC)    Chronic pain    Chronic systolic CHF (congestive heart failure) (HCC)    Diabetes mellitus    DKA (diabetic ketoacidosis) (HCC)    Drug-seeking behavior    21 hospitalizations and 14 CT a/p in 2 years for N/V and abdominal pain, demanding only IV dilaudid   Elevated troponin    chronic   Esophageal reflux    Fibromyalgia    Gastric ulcer    Gastroparesis    Gout    Hyperlipidemia    Hypertension    Hypokalemia    Hypomagnesemia    Lumbosacral stenosis    LVH (left ventricular hypertrophy)    Morbid obesity (HCC)    NICM (nonischemic cardiomyopathy) (HCC)    PAF (paroxysmal atrial fibrillation) (HCC)    Stroke (HCC) 02/2011   Thrombocytosis    Vitamin B12 deficiency anemia    Past Surgical History:  Procedure Laterality Date   ABDOMINAL AORTOGRAM W/LOWER EXTREMITY N/A 11/29/2022   Procedure: ABDOMINAL AORTOGRAM W/LOWER EXTREMITY;  Surgeon: Daria Pastures, MD;  Location: [facility];  Service: Cardiovascular;  Laterality: N/A;   AMPUTATION Right 12/29/2022   Procedure: RIGHT BELOW KNEE AMPUTATION;  Surgeon: Nadara Mustard, MD;  Location: [facility] OR;  Service: Orthopedics;  Laterality: Right;   AV FISTULA PLACEMENT Left 06/30/2022   Procedure: LEFT  BRACHIOCEPHALIC ARTERIOVENOUS (AV) FISTULA CREATION;  Surgeon: Cephus Shelling, MD;  Location: [facility] OR;  Service: Vascular;  Laterality: Left;   BIOPSY  07/27/2019   Procedure: BIOPSY;  Surgeon: Vida Rigger, MD;  Location: [facility];  Service: Endoscopy;;   BIOPSY  07/30/2019   Procedure: BIOPSY;  Surgeon: Kathi Der, MD;  Location: [facility];  Service: Gastroenterology;;   CATARACT EXTRACTION  01/2014   CHOLECYSTECTOMY     COLONOSCOPY WITH PROPOFOL N/A 07/30/2019   Procedure: COLONOSCOPY WITH PROPOFOL;  Surgeon: Kathi Der, MD;  Location: [facility];  Service: Gastroenterology;  Laterality: N/A;   ESOPHAGOGASTRODUODENOSCOPY N/A 07/27/2019   Procedure: ESOPHAGOGASTRODUODENOSCOPY (EGD);  Surgeon: Vida Rigger, MD;  Location: [facility];  Service: Endoscopy;  Laterality: N/A;   ESOPHAGOGASTRODUODENOSCOPY N/A 07/26/2020   Procedure: ESOPHAGOGASTRODUODENOSCOPY (EGD);  Surgeon: Willis Modena, MD;  Location: [facility];  Service: Endoscopy;  Laterality: N/A;   ESOPHAGOGASTRODUODENOSCOPY (EGD) WITH PROPOFOL N/A 08/02/2019   Procedure: ESOPHAGOGASTRODUODENOSCOPY (EGD) WITH PROPOFOL;  Surgeon: Kathi Der, MD;  Location: [facility];  Service: Gastroenterology;  Laterality: N/A;   ESOPHAGOGASTRODUODENOSCOPY (EGD) WITH PROPOFOL N/A 12/23/2022   Procedure: ESOPHAGOGASTRODUODENOSCOPY (EGD) WITH PROPOFOL;  Surgeon: Shellia Cleverly, DO;  Location: [facility];  Service: Gastroenterology;  Laterality: N/A;   FISTULA SUPERFICIALIZATION Left 12/31/2022   Procedure: LEFT ARM FISTULA TRANSPOSITION;  Surgeon: Maeola Harman, MD;  Location: [facility] OR;  Service: Vascular;  Laterality: Left;  GIVENS CAPSULE STUDY N/A 12/23/2022   Procedure: GIVENS CAPSULE STUDY;  Surgeon: Shellia Cleverly, DO;  Location: [facility];  Service: Gastroenterology;  Laterality: N/A;   HEMOSTASIS CLIP PLACEMENT  08/02/2019   Procedure: HEMOSTASIS CLIP PLACEMENT;  Surgeon: Kathi Der, MD;   Location: [facility];  Service: Gastroenterology;;   HOT HEMOSTASIS N/A 12/23/2022   Procedure: HOT HEMOSTASIS (ARGON PLASMA COAGULATION/BICAP);  Surgeon: Shellia Cleverly, DO;  Location: [facility] ENDOSCOPY;  Service: Gastroenterology;  Laterality: N/A;   IR FLUORO GUIDE CV LINE RIGHT  06/24/2022   IR US GUIDE VASC ACCESS RIGHT  06/24/2022   POLYPECTOMY  07/30/2019   Procedure: POLYPECTOMY;  Surgeon: Kathi Der, MD;  Location: [facility];  Service: Gastroenterology;;   POLYPECTOMY  08/02/2019   Procedure: POLYPECTOMY;  Surgeon: Kathi Der, MD;  Location: [facility];  Service: Gastroenterology;;   Social History   Socioeconomic History   Marital status: Married    Spouse name: Not on file   Number of children: Not on file   Years of education: Not on file   Highest education level: Not on file  Occupational History   Not on file  Tobacco Use   Smoking status: Never   Smokeless tobacco: Never  Vaping Use   Vaping status: Never Used  Substance and Sexual Activity   Alcohol use: No   Drug use: No   Sexual activity: Not Currently    Birth control/protection: None  Other Topics Concern   Not on file  Social History Narrative   ** Merged History Encounter **       Social Determinants of Health   Financial Resource Strain: Low Risk (03/23/2021)   Received from [facility])   Financial Resource Strain  Food Insecurity: No Food Insecurity (01/30/2023)   Hunger Vital Sign    Worried About Running Out of Food in the Last Year: Never true    Ran Out of Food in the Last Year: Never true  Recent Concern: Food Insecurity - High Risk (01/25/2023)   Received from [facility]   Hunger Vital Sign    Worried About Running Out of Food in the Last Year: Patient declined to answer    Ran Out of Food in the Last Year: Often true  Transportation Needs: No Transportation Needs (01/30/2023)   PRAPARE - Administrator, Civil Service (Medical): No     Lack of Transportation (Non-Medical): No  Recent Concern: Transportation Needs - Unmet Transportation Needs (01/25/2023)   Received from [facility]    In the past 12 months, has lack of reliable transportation kept you from medical appointments, meetings, work or from getting things needed for daily living? : Yes  Physical Activity: Not on File (10/31/2017)   Received from [place], [place]   Physical Activity    Physical Activity: 0  Stress: Low Risk (03/23/2021)   [facility])   Stress    Over the last 2 weeks, how often have you been bothered by the following problems: feeling nervous, anxious, on edge?: Not at all    Over the last 2 weeks, how often have you been bothered by the following problems: Not being able to stop or control worrying?: Not at all  Social Connections: Unknown (07/19/2021)   Received from [facility]   Social Network    Social Network: Not on file   Family History  Problem Relation Age of Onset  Diabetes Mother    Diabetes Father    Heart disease Father    Diabetes Sister    Congestive Heart Failure Sister 9   Diabetes Brother    - negative except otherwise stated in the family history section Allergies  Allergen Reactions   Isovue [Iopamidol] Anaphylaxis, [name] and Other (See Comments)    11/28/17 Patient had seizure like activity and then 1 min code after 100 cc of isovue 300. Possible contrast allergy vs vasovagal episode  Cardiac Arrest   Neurontin [Gabapentin] [name] and Swelling   Nsaids Anaphylaxis and Other (See Comments)    Hx of stomach ulcers   Penicillins Itching, Palpitations and Other (See Comments)    Flushing (Red Skin) Laryngeal Edema   Reglan [Metoclopramide] Other (See Comments)    Tardive dyskinesia    Valium [Diazepam] [name]   Zestril [Lisinopril] Anaphylaxis and Swelling    Tongue and mouth  swelling Laryngeal Edema   Tolectin [Tolmetin] Nausea And Vomiting, Nausea Only and Other (See Comments)    Irritates stomach ulcer   Asa [Aspirin] Other (See Comments)    Hx of stomach ulcer   Aspartame And Phenylalanine Hives   Bentyl [Dicyclomine] Other (See Comments)    Chest pain   Hibiclens [Chlorhexidine Gluconate] Other (See Comments)    Dermatitis    Flexeril [Cyclobenzaprine] Palpitations   Oxycontin [Oxycodone] Palpitations   Rifamycins Palpitations   Tylenol [Acetaminophen] Nausea And Vomiting, Nausea Only and Other (See Comments)    Irritates stomach ulcer Abdominal pain   Ultram [Tramadol] Nausea And Vomiting and Palpitations   Prior to Admission medications   Medication Sig Start Date End Date Taking? Authorizing Provider  albuterol (PROVENTIL) (2.5 MG/3ML) 0.083% nebulizer solution Take 3 mLs (2.5 mg total) by nebulization every 6 (six) hours as needed for wheezing or shortness of breath. 04/06/19  Yes Bing Neighbors, NP  allopurinol (ZYLOPRIM) 100 MG tablet Take 1 tablet (100 mg total) by mouth 2 (two) times daily. 11/30/22  Yes Azucena Fallen, MD  Amino Acids-Protein Hydrolys (FEEDING SUPPLEMENT, PRO-STAT SUGAR FREE 64,) LIQD Take 30 mLs by mouth 2 (two) times daily between meals.   Yes [provider]  ascorbic acid (VITAMIN C) 500 MG tablet Take 500 mg by mouth daily.   Yes [provider]  atorvastatin (LIPITOR) 10 MG tablet Take 10 mg by mouth every evening. 12/03/21  Yes [provider]  busPIRone (BUSPAR) 5 MG tablet Take 5 mg by mouth 3 (three) times daily.   Yes [provider]  cefdinir (OMNICEF) 300 MG capsule Take 1 capsule (300 mg total) by mouth daily. 01/21/23  Yes Linwood Dibbles, MD  cetirizine (ZYRTEC) 10 MG tablet Take 10 mg by mouth daily. 08/05/22  Yes [provider]  famotidine (PEPCID) 20 MG tablet Take 20 mg by mouth daily before breakfast.   Yes [provider]  ferric citrate (AURYXIA) 1  GM 210 MG(Fe) tablet Take 2 tablets (420 mg total) by mouth 3 (three) times daily with meals. Patient taking differently: Take 210 mg by mouth 3 (three) times daily with meals. 11/30/22  Yes Azucena Fallen, MD  fluticasone [facility]) 50 MCG/ACT nasal spray Place 2 sprays into both nostrils daily as needed for allergies or rhinitis. 12/19/18  Yes Rai, Ripudeep K, MD  folic acid (FOLVITE) 1 MG tablet Take 1 tablet (1 mg total) by mouth daily. 01/10/23  Yes Danford, Earl Lites, MD  gabapentin (NEURONTIN) 100 MG capsule Take  1 capsule (100 mg total) by mouth 3 (three) times daily. 11/30/22  Yes Azucena Fallen, MD  HYDROmorphone (DILAUDID) 4 MG tablet Take 1 tablet (4 mg total) by mouth in the morning, at noon, in the evening, and at bedtime. Patient taking differently: Take 4 mg by mouth See admin instructions. Give 4 mg (1 tablet) three times daily for pain. Give first dose at 0600 on dialysis days (T-Th-Sat), otherwise give first dose at 0900. May give an additional tablet every 24 hours as needed for severe pain (do not administer within 3 hours of scheduled dose). 01/10/23  Yes Danford, Earl Lites, MD  hydrOXYzine (ATARAX) 25 MG tablet Take 1 tablet (25 mg total) by mouth 3 (three) times daily as needed for anxiety. Patient taking differently: Take 25 mg by mouth See admin instructions. Give 25 mg (1 tablet) every 6 hours as needed for anxiety, agitation for 14 days (starting 11/19), then every 8 hours as needed. 01/10/23  Yes Danford, Earl Lites, MD  insulin glargine (SEMGLEE, YFGN,) 100 UNIT/ML Solostar Pen Inject 8 Units into the skin at bedtime.   Yes [provider]  insulin lispro (HUMALOG) 100 UNIT/ML KwikPen Before each meal 3 times a day, 140-199 - 2 units, 200-250 - 6 units, 251-299 - 8 units,  300-349 - 12 units,  350 or above 14 units. Patient taking differently: Inject 0-14 Units into the skin See admin instructions. Inject 0-14 units per sliding scale before meals: <  70 notify MD 70-139 : 0 units 140-199 : 2 units 200-250 : 6 units 251-299 : 8 units 300-349 : 12 units 350-400 : 14 units > 401 notify MD. 06/04/22  Yes Leroy Sea, MD  loperamide (IMODIUM) 2 MG capsule Take 1 capsule (2 mg total) by mouth as needed for diarrhea or loose stools. Patient taking differently: Take 2 mg by mouth daily as needed for diarrhea or loose stools. 01/10/23  Yes Danford, Earl Lites, MD  methocarbamol (ROBAXIN) 500 MG tablet Take 1 tablet (500 mg total) by mouth every 6 (six) hours as needed for muscle spasms. 01/10/23  Yes Danford, Earl Lites, MD  metoprolol tartrate (LOPRESSOR) 50 MG tablet Take 1 tablet (50 mg total) by mouth 2 (two) times daily. 01/10/23  Yes Danford, Earl Lites, MD  mirtazapine (REMERON) 15 MG tablet Take 15 mg by mouth at bedtime. 10/18/22  Yes [provider]  Multiple Vitamins-Minerals (SENIOR MULTIVITAMIN PLUS PO) Take 1 tablet by mouth daily.   Yes [provider]  nutrition supplement, JUVEN, (JUVEN) PACK Take 1 packet by mouth 2 (two) times daily between meals. 01/10/23  Yes Danford, Earl Lites, MD  Nutritional Supplement LIQD Take 120-237 mLs by mouth See admin instructions. House supplement. Give 120 mL in the morning at 0900 and 237 mL twice daily between meals (1000, 1400)   Yes [provider]  Nutritional Supplements (,FEEDING SUPPLEMENT, PROSOURCE PLUS) liquid Take 30 mLs by mouth 2 (two) times daily between meals. 01/10/23  Yes Danford, Earl Lites, MD  pantoprazole (PROTONIX) 40 MG tablet Take 40 mg by mouth 2 (two) times daily.   Yes [provider]  zinc sulfate 220 (50 Zn) MG capsule Take 1 capsule (220 mg total) by mouth daily. 01/10/23  Yes Danford, Earl Lites, MD  colchicine 0.6 MG tablet Take 0.5 tablets (0.3 mg total) by mouth 2 (two) times a week. Patient not taking: Reported on 01/29/2023 12/02/22   Azucena Fallen, MD   No results found. - pertinent  xrays, CT, MRI  studies were reviewed and independently interpreted  Positive ROS: All other systems have been reviewed and were otherwise negative with the exception of those mentioned in the HPI and as above.  Physical Exam: General: Alert, no acute distress Psychiatric: Patient is competent for consent with normal mood and affect Lymphatic: No axillary or cervical lymphadenopathy Cardiovascular: No pedal edema Respiratory: No cyanosis, no use of accessory musculature GI: No organomegaly, abdomen is soft and non-tender    Images:  @ENCIMAGES @  Labs:  Lab Results  Component Value Date   HGBA1C 8.1 (H) 06/19/2022   HGBA1C 10.1 (H) 03/21/2022   HGBA1C 7.5 (H) 08/16/2021   ESRSEDRATE 90 (H) 01/29/2023   ESRSEDRATE 119 (H) 12/21/2022   ESRSEDRATE 122 (H) 11/24/2022   CRP 4.1 (H) 01/29/2023   CRP 14.8 (H) 12/21/2022   CRP 2.7 (H) 11/30/2022   LABURIC 6.7 11/28/2022   LABURIC 6.2 07/04/2022   LABURIC 8.0 (H) 06/20/2022   REPTSTATUS PENDING 01/30/2023   CULT  01/30/2023    NO GROWTH < 12 HOURS Performed at [facility], [address] [place], [place] [address]    LABORGA ESCHERICHIA COLI (A) 08/15/2021    Lab Results  Component Value Date   ALBUMIN 2.0 (L) 01/30/2023   ALBUMIN 2.0 (L) 01/27/2023   ALBUMIN 2.0 (L) 01/21/2023   LABURIC 6.7 11/28/2022   LABURIC 6.2 07/04/2022   LABURIC 8.0 (H) 06/20/2022        Latest Ref Rng & Units 01/30/2023    4:34 AM 01/29/2023   11:20 PM 01/29/2023    2:30 PM  CBC EXTENDED  WBC 4.0 - 10.5 K/uL 15.2  15.7  18.7   RBC 3.87 - 5.11 MIL/uL 2.54  2.89  2.67   Hemoglobin 12.0 - 15.0 g/dL 7.4  8.4  7.8   HCT 60.4 - 46.0 % 24.1  27.7  25.7   Platelets 150 - 400 K/uL 398  332  425   NEUT# 1.7 - 7.7 K/uL   14.1   Lymph# 0.7 - 4.0 K/uL   3.0     Neurologic: Patient does not have protective sensation bilateral lower extremities.   MUSCULOSKELETAL:   Skin: Examination there is necrotic tissue and dehiscence of the transtibial  amputation on the right.  Patient also has a heel ulcer on the left which appears superficial.  White cell count 15.2 with a hemoglobin of 7.4.  Albumin 2.0 with a hemoglobin A1c consistently elevated, currently 8.1.  No ascending cellulitis.  Assessment: Assessment diabetic insensate neuropathy with end-stage renal disease on dialysis with dehiscence right transtibial amputation.  Plan: Will plan for revision of the transtibial amputation.  Patient states she would like to be able to wear a prosthesis and ambulate.  With an above-the-knee amputation patient would not have sufficient strength to ambulate.  Discussed that she has increased risk of the wound not healing need for additional surgery.  Patient states she understands wished to proceed at this time.  Will plan for surgery on Wednesday.  Ideally patient can receive dialysis either Tuesday or Thursday.  Patient may need transfusion.  Thank you for the consult and the opportunity to see Ms. Charmaine Downs, MD [facility] [phone] 8:23 AM

## 2023-01-31 NOTE — Progress Notes (Signed)
PT Cancellation Note  Patient Details Name: KYTZIA HESTERBERG MRN: [id number] DOB: Jul 22, 1965   Cancelled Treatment:    Reason Eval/Treat Not Completed: Patient declined, no reason specified (Pt reports she is in too much pain to move currently. Pt reports PTA she is mostly bedbound and has husband use hoyer lift to get her in and out of chair. Plan is for R AKA on 11/27. Will follow up.)   [name] 01/31/2023, 12:07 PM

## 2023-01-31 NOTE — Consult Note (Addendum)
WOC Nurse Consult Note: Reason for Consult:Right BKA dehiscence with planned surgical revision today.  Left heel nonhealing unstageable pressure injury.  Was previously closed, is now draining.  Stage 3 pressure injury to sacrum.  Moisture associated skin damage to bilateral buttocks.   Wound type:unstageable pressure injury Pressure Injury POA: Yes Measurement: 2.5 cm x 2 cm to left heel.  Sacrum:  2 cm x 0.3 cm x 0.1 cm  Bilateral gluteal folds:  1.5 cm x  1 cm x 0.1 cm  Wound bed:100% devitalized tissue to wound bed left heel.  Cleanse sacral and buttocks' wounds with VASHE Cleanser and apply barrier ointment.   Drainage (amount, consistency, odor) minimal serosanguinous necrotic odor Periwound: dry skin  Dressing procedure/placement/frequency: Cleanse left heel with NS and pat dry. Apply Santyl to wound bed.  Cover with NS moist gauze.  Secure with dry gauze and kerlix/tape  Change daily.  Will not follow at this time.  Please re-consult if needed.  Mike Gip MSN, RN, FNP-BC CWON Wound, Ostomy, Continence Nurse Outpatient [facility] [phone] Pager [phone]

## 2023-01-31 NOTE — Progress Notes (Signed)
OT Cancellation Note  Patient Details Name: Deborah [name] MRN: [id number] DOB: 12-02-1965   Cancelled Treatment:    Reason Eval/Treat Not Completed: Patient declined, no reason specified (Pt refused upon arrival stating she's in too much pain to attempt any movement despite maximal encouragement.)  Of note - pt reports she is relatively bed bound at baseline, her husband and PCA assists with all self care at bed level including bed pan use. Pt reports her husband lifts her OOB to the [facility] with hoyer "sometimes" but she was unable to report when the last time she transferred OOB.   Pt will likely benefit from an air mattress acutely to prevent further skin integrity impairments.   [name] 01/31/2023, 10:49 AM

## 2023-01-31 NOTE — H&P (View-Only) (Signed)
ORTHOPAEDIC CONSULTATION  REQUESTING PHYSICIAN: Maretta Bees, MD  Chief Complaint: Dehiscence right transtibial amputation.  HPI: Deborah [name] is a 57 y.o. female who presents with acute dehiscence right below-knee amputation.  Patient states she was changing her dressing and noted the wound dehiscence.  Patient has diabetic insensate neuropathy end-stage renal disease on dialysis.  Past Medical History:  Diagnosis Date   Acute back pain with sciatica, left    Acute back pain with sciatica, right    AKI (acute kidney injury) (HCC)    Anemia, unspecified    Cancer (HCC)    Carcinoid tumor of duodenum    Chest pain with normal coronary angiography 2019   Chronic kidney disease, stage 3b (HCC)    Chronic pain    Chronic systolic CHF (congestive heart failure) (HCC)    Diabetes mellitus    DKA (diabetic ketoacidosis) (HCC)    Drug-seeking behavior    21 hospitalizations and 14 CT a/p in 2 years for N/V and abdominal pain, demanding only IV dilaudid   Elevated troponin    chronic   Esophageal reflux    Fibromyalgia    Gastric ulcer    Gastroparesis    Gout    Hyperlipidemia    Hypertension    Hypokalemia    Hypomagnesemia    Lumbosacral stenosis    LVH (left ventricular hypertrophy)    Morbid obesity (HCC)    NICM (nonischemic cardiomyopathy) (HCC)    PAF (paroxysmal atrial fibrillation) (HCC)    Stroke (HCC) 02/2011   Thrombocytosis    Vitamin B12 deficiency anemia    Past Surgical History:  Procedure Laterality Date   ABDOMINAL AORTOGRAM W/LOWER EXTREMITY N/A 11/29/2022   Procedure: ABDOMINAL AORTOGRAM W/LOWER EXTREMITY;  Surgeon: Daria Pastures, MD;  Location: [facility];  Service: Cardiovascular;  Laterality: N/A;   AMPUTATION Right 12/29/2022   Procedure: RIGHT BELOW KNEE AMPUTATION;  Surgeon: Nadara Mustard, MD;  Location: [facility] OR;  Service: Orthopedics;  Laterality: Right;   AV FISTULA PLACEMENT Left 06/30/2022   Procedure: LEFT  BRACHIOCEPHALIC ARTERIOVENOUS (AV) FISTULA CREATION;  Surgeon: Cephus Shelling, MD;  Location: [facility] OR;  Service: Vascular;  Laterality: Left;   BIOPSY  07/27/2019   Procedure: BIOPSY;  Surgeon: Vida Rigger, MD;  Location: [facility];  Service: Endoscopy;;   BIOPSY  07/30/2019   Procedure: BIOPSY;  Surgeon: Kathi Der, MD;  Location: [facility];  Service: Gastroenterology;;   CATARACT EXTRACTION  01/2014   CHOLECYSTECTOMY     COLONOSCOPY WITH PROPOFOL N/A 07/30/2019   Procedure: COLONOSCOPY WITH PROPOFOL;  Surgeon: Kathi Der, MD;  Location: [facility];  Service: Gastroenterology;  Laterality: N/A;   ESOPHAGOGASTRODUODENOSCOPY N/A 07/27/2019   Procedure: ESOPHAGOGASTRODUODENOSCOPY (EGD);  Surgeon: Vida Rigger, MD;  Location: [facility];  Service: Endoscopy;  Laterality: N/A;   ESOPHAGOGASTRODUODENOSCOPY N/A 07/26/2020   Procedure: ESOPHAGOGASTRODUODENOSCOPY (EGD);  Surgeon: Willis Modena, MD;  Location: [facility];  Service: Endoscopy;  Laterality: N/A;   ESOPHAGOGASTRODUODENOSCOPY (EGD) WITH PROPOFOL N/A 08/02/2019   Procedure: ESOPHAGOGASTRODUODENOSCOPY (EGD) WITH PROPOFOL;  Surgeon: Kathi Der, MD;  Location: [facility];  Service: Gastroenterology;  Laterality: N/A;   ESOPHAGOGASTRODUODENOSCOPY (EGD) WITH PROPOFOL N/A 12/23/2022   Procedure: ESOPHAGOGASTRODUODENOSCOPY (EGD) WITH PROPOFOL;  Surgeon: Shellia Cleverly, DO;  Location: [facility];  Service: Gastroenterology;  Laterality: N/A;   FISTULA SUPERFICIALIZATION Left 12/31/2022   Procedure: LEFT ARM FISTULA TRANSPOSITION;  Surgeon: Maeola Harman, MD;  Location: [facility] OR;  Service: Vascular;  Laterality: Left;  GIVENS CAPSULE STUDY N/A 12/23/2022   Procedure: GIVENS CAPSULE STUDY;  Surgeon: Shellia Cleverly, DO;  Location: [facility];  Service: Gastroenterology;  Laterality: N/A;   HEMOSTASIS CLIP PLACEMENT  08/02/2019   Procedure: HEMOSTASIS CLIP PLACEMENT;  Surgeon: Kathi Der, MD;   Location: [facility];  Service: Gastroenterology;;   HOT HEMOSTASIS N/A 12/23/2022   Procedure: HOT HEMOSTASIS (ARGON PLASMA COAGULATION/BICAP);  Surgeon: Shellia Cleverly, DO;  Location: [facility] ENDOSCOPY;  Service: Gastroenterology;  Laterality: N/A;   IR FLUORO GUIDE CV LINE RIGHT  06/24/2022   IR US GUIDE VASC ACCESS RIGHT  06/24/2022   POLYPECTOMY  07/30/2019   Procedure: POLYPECTOMY;  Surgeon: Kathi Der, MD;  Location: [facility];  Service: Gastroenterology;;   POLYPECTOMY  08/02/2019   Procedure: POLYPECTOMY;  Surgeon: Kathi Der, MD;  Location: [facility];  Service: Gastroenterology;;   Social History   Socioeconomic History   Marital status: Married    Spouse name: Not on file   Number of children: Not on file   Years of education: Not on file   Highest education level: Not on file  Occupational History   Not on file  Tobacco Use   Smoking status: Never   Smokeless tobacco: Never  Vaping Use   Vaping status: Never Used  Substance and Sexual Activity   Alcohol use: No   Drug use: No   Sexual activity: Not Currently    Birth control/protection: None  Other Topics Concern   Not on file  Social History Narrative   ** Merged History Encounter **       Social Determinants of Health   Financial Resource Strain: Low Risk (03/23/2021)   [facility]   Financial Resource Strain  Food Insecurity: No Food Insecurity (01/30/2023)   Hunger Vital Sign    Worried About Running Out of Food in the Last Year: Never true    Ran Out of Food in the Last Year: Never true  Recent Concern: Food Insecurity - High Risk (01/25/2023)   Received from [facility]   Hunger Vital Sign    Worried About Running Out of Food in the Last Year: Patient declined to answer    Ran Out of Food in the Last Year: Often true  Transportation Needs: No Transportation Needs (01/30/2023)   PRAPARE - Administrator, Civil Service (Medical): No     Lack of Transportation (Non-Medical): No  Recent Concern: Transportation Needs - Unmet Transportation Needs (01/25/2023)   Received from [employer]    In the past 12 months, has lack of reliable transportation kept you from medical appointments, meetings, work or from getting things needed for daily living? : Yes  Physical Activity: Not on File (10/31/2017)   Received from [place], [place]   Physical Activity    Physical Activity: 0  Stress: Low Risk (03/23/2021)   Received [facility])   Stress    Over the last 2 weeks, how often have you been bothered by the following problems: feeling nervous, anxious, on edge?: Not at all    Over the last 2 weeks, how often have you been bothered by the following problems: Not being able to stop or control worrying?: Not at all  Social Connections: Unknown (07/19/2021)   Received from [facility]   Social Network    Social Network: Not on file   Family History  Problem Relation Age of Onset  Diabetes Mother    Diabetes Father    Heart disease Father    Diabetes Sister    Congestive Heart Failure Sister 75   Diabetes Brother    - negative except otherwise stated in the family history section Allergies  Allergen Reactions   Isovue [Iopamidol] Anaphylaxis, [name] and Other (See Comments)    11/28/17 Patient had seizure like activity and then 1 min code after 100 cc of isovue 300. Possible contrast allergy vs vasovagal episode  Cardiac Arrest   Neurontin [Gabapentin] [name] and Swelling   Nsaids Anaphylaxis and Other (See Comments)    Hx of stomach ulcers   Penicillins Itching, Palpitations and Other (See Comments)    Flushing (Red Skin) Laryngeal Edema   Reglan [Metoclopramide] Other (See Comments)    Tardive dyskinesia    Valium [Diazepam] [name]   Zestril [Lisinopril] Anaphylaxis and Swelling    Tongue and mouth  swelling Laryngeal Edema   Tolectin [Tolmetin] Nausea And Vomiting, Nausea Only and Other (See Comments)    Irritates stomach ulcer   Asa [Aspirin] Other (See Comments)    Hx of stomach ulcer   Aspartame And Phenylalanine Hives   Bentyl [Dicyclomine] Other (See Comments)    Chest pain   Hibiclens [Chlorhexidine Gluconate] Other (See Comments)    Dermatitis    Flexeril [Cyclobenzaprine] Palpitations   Oxycontin [Oxycodone] Palpitations   Rifamycins Palpitations   Tylenol [Acetaminophen] Nausea And Vomiting, Nausea Only and Other (See Comments)    Irritates stomach ulcer Abdominal pain   Ultram [Tramadol] Nausea And Vomiting and Palpitations   Prior to Admission medications   Medication Sig Start Date End Date Taking? Authorizing Provider  albuterol (PROVENTIL) (2.5 MG/3ML) 0.083% nebulizer solution Take 3 mLs (2.5 mg total) by nebulization every 6 (six) hours as needed for wheezing or shortness of breath. 04/06/19  Yes Bing Neighbors, NP  allopurinol (ZYLOPRIM) 100 MG tablet Take 1 tablet (100 mg total) by mouth 2 (two) times daily. 11/30/22  Yes Azucena Fallen, MD  Amino Acids-Protein Hydrolys (FEEDING SUPPLEMENT, PRO-STAT SUGAR FREE 64,) LIQD Take 30 mLs by mouth 2 (two) times daily between meals.   Yes [provider]  ascorbic acid (VITAMIN C) 500 MG tablet Take 500 mg by mouth daily.   Yes [provider]  atorvastatin (LIPITOR) 10 MG tablet Take 10 mg by mouth every evening. 12/03/21  Yes [provider]  busPIRone (BUSPAR) 5 MG tablet Take 5 mg by mouth 3 (three) times daily.   Yes [provider]  cefdinir (OMNICEF) 300 MG capsule Take 1 capsule (300 mg total) by mouth daily. 01/21/23  Yes Linwood Dibbles, MD  cetirizine (ZYRTEC) 10 MG tablet Take 10 mg by mouth daily. 08/05/22  Yes [provider]  famotidine (PEPCID) 20 MG tablet Take 20 mg by mouth daily before breakfast.   Yes [provider]  ferric citrate (AURYXIA) 1  GM 210 MG(Fe) tablet Take 2 tablets (420 mg total) by mouth 3 (three) times daily with meals. Patient taking differently: Take 210 mg by mouth 3 (three) times daily with meals. 11/30/22  Yes Azucena Fallen, MD  fluticasone [facility]) 50 MCG/ACT nasal spray Place 2 sprays into both nostrils daily as needed for allergies or rhinitis. 12/19/18  Yes Rai, Ripudeep K, MD  folic acid (FOLVITE) 1 MG tablet Take 1 tablet (1 mg total) by mouth daily. 01/10/23  Yes Danford, Earl Lites, MD  gabapentin (NEURONTIN) 100 MG capsule Take  1 capsule (100 mg total) by mouth 3 (three) times daily. 11/30/22  Yes Azucena Fallen, MD  HYDROmorphone (DILAUDID) 4 MG tablet Take 1 tablet (4 mg total) by mouth in the morning, at noon, in the evening, and at bedtime. Patient taking differently: Take 4 mg by mouth See admin instructions. Give 4 mg (1 tablet) three times daily for pain. Give first dose at 0600 on dialysis days (T-Th-Sat), otherwise give first dose at 0900. May give an additional tablet every 24 hours as needed for severe pain (do not administer within 3 hours of scheduled dose). 01/10/23  Yes Danford, Earl Lites, MD  hydrOXYzine (ATARAX) 25 MG tablet Take 1 tablet (25 mg total) by mouth 3 (three) times daily as needed for anxiety. Patient taking differently: Take 25 mg by mouth See admin instructions. Give 25 mg (1 tablet) every 6 hours as needed for anxiety, agitation for 14 days (starting 11/19), then every 8 hours as needed. 01/10/23  Yes Danford, Earl Lites, MD  insulin glargine (SEMGLEE, YFGN,) 100 UNIT/ML Solostar Pen Inject 8 Units into the skin at bedtime.   Yes [provider]  insulin lispro (HUMALOG) 100 UNIT/ML KwikPen Before each meal 3 times a day, 140-199 - 2 units, 200-250 - 6 units, 251-299 - 8 units,  300-349 - 12 units,  350 or above 14 units. Patient taking differently: Inject 0-14 Units into the skin See admin instructions. Inject 0-14 units per sliding scale before meals: <  70 notify MD 70-139 : 0 units 140-199 : 2 units 200-250 : 6 units 251-299 : 8 units 300-349 : 12 units 350-400 : 14 units > 401 notify MD. 06/04/22  Yes Leroy Sea, MD  loperamide (IMODIUM) 2 MG capsule Take 1 capsule (2 mg total) by mouth as needed for diarrhea or loose stools. Patient taking differently: Take 2 mg by mouth daily as needed for diarrhea or loose stools. 01/10/23  Yes Danford, Earl Lites, MD  methocarbamol (ROBAXIN) 500 MG tablet Take 1 tablet (500 mg total) by mouth every 6 (six) hours as needed for muscle spasms. 01/10/23  Yes Danford, Earl Lites, MD  metoprolol tartrate (LOPRESSOR) 50 MG tablet Take 1 tablet (50 mg total) by mouth 2 (two) times daily. 01/10/23  Yes Danford, Earl Lites, MD  mirtazapine (REMERON) 15 MG tablet Take 15 mg by mouth at bedtime. 10/18/22  Yes [provider]  Multiple Vitamins-Minerals (SENIOR MULTIVITAMIN PLUS PO) Take 1 tablet by mouth daily.   Yes [provider]  nutrition supplement, JUVEN, (JUVEN) PACK Take 1 packet by mouth 2 (two) times daily between meals. 01/10/23  Yes Danford, Earl Lites, MD  Nutritional Supplement LIQD Take 120-237 mLs by mouth See admin instructions. House supplement. Give 120 mL in the morning at 0900 and 237 mL twice daily between meals (1000, 1400)   Yes [provider]  Nutritional Supplements (,FEEDING SUPPLEMENT, PROSOURCE PLUS) liquid Take 30 mLs by mouth 2 (two) times daily between meals. 01/10/23  Yes Danford, Earl Lites, MD  pantoprazole (PROTONIX) 40 MG tablet Take 40 mg by mouth 2 (two) times daily.   Yes [provider]  zinc sulfate 220 (50 Zn) MG capsule Take 1 capsule (220 mg total) by mouth daily. 01/10/23  Yes Danford, Earl Lites, MD  colchicine 0.6 MG tablet Take 0.5 tablets (0.3 mg total) by mouth 2 (two) times a week. Patient not taking: Reported on 01/29/2023 12/02/22   Azucena Fallen, MD   No results found. - pertinent  xrays, CT, MRI  studies were reviewed and independently interpreted  Positive ROS: All other systems have been reviewed and were otherwise negative with the exception of those mentioned in the HPI and as above.  Physical Exam: General: Alert, no acute distress Psychiatric: Patient is competent for consent with normal mood and affect Lymphatic: No axillary or cervical lymphadenopathy Cardiovascular: No pedal edema Respiratory: No cyanosis, no use of accessory musculature GI: No organomegaly, abdomen is soft and non-tender    Images:  @ENCIMAGES @  Labs:  Lab Results  Component Value Date   HGBA1C 8.1 (H) 06/19/2022   HGBA1C 10.1 (H) 03/21/2022   HGBA1C 7.5 (H) 08/16/2021   ESRSEDRATE 90 (H) 01/29/2023   ESRSEDRATE 119 (H) 12/21/2022   ESRSEDRATE 122 (H) 11/24/2022   CRP 4.1 (H) 01/29/2023   CRP 14.8 (H) 12/21/2022   CRP 2.7 (H) 11/30/2022   LABURIC 6.7 11/28/2022   LABURIC 6.2 07/04/2022   LABURIC 8.0 (H) 06/20/2022   REPTSTATUS PENDING 01/30/2023   CULT  01/30/2023    NO GROWTH < 12 HOURS Performed at [facility], [address] [place], [place] [address]    LABORGA ESCHERICHIA COLI (A) 08/15/2021    Lab Results  Component Value Date   ALBUMIN 2.0 (L) 01/30/2023   ALBUMIN 2.0 (L) 01/27/2023   ALBUMIN 2.0 (L) 01/21/2023   LABURIC 6.7 11/28/2022   LABURIC 6.2 07/04/2022   LABURIC 8.0 (H) 06/20/2022        Latest Ref Rng & Units 01/30/2023    4:34 AM 01/29/2023   11:20 PM 01/29/2023    2:30 PM  CBC EXTENDED  WBC 4.0 - 10.5 K/uL 15.2  15.7  18.7   RBC 3.87 - 5.11 MIL/uL 2.54  2.89  2.67   Hemoglobin 12.0 - 15.0 g/dL 7.4  8.4  7.8   HCT 60.4 - 46.0 % 24.1  27.7  25.7   Platelets 150 - 400 K/uL 398  332  425   NEUT# 1.7 - 7.7 K/uL   14.1   Lymph# 0.7 - 4.0 K/uL   3.0     Neurologic: Patient does not have protective sensation bilateral lower extremities.   MUSCULOSKELETAL:   Skin: Examination there is necrotic tissue and dehiscence of the transtibial  amputation on the right.  Patient also has a heel ulcer on the left which appears superficial.  White cell count 15.2 with a hemoglobin of 7.4.  Albumin 2.0 with a hemoglobin A1c consistently elevated, currently 8.1.  No ascending cellulitis.  Assessment: Assessment diabetic insensate neuropathy with end-stage renal disease on dialysis with dehiscence right transtibial amputation.  Plan: Will plan for revision of the transtibial amputation.  Patient states she would like to be able to wear a prosthesis and ambulate.  With an above-the-knee amputation patient would not have sufficient strength to ambulate.  Discussed that she has increased risk of the wound not healing need for additional surgery.  Patient states she understands wished to proceed at this time.  Will plan for surgery on Wednesday.  Ideally patient can receive dialysis either Tuesday or Thursday.  Patient may need transfusion.  Thank you for the consult and the opportunity to see Ms. Charmaine Downs, MD [facility] [phone] 8:23 AM

## 2023-01-31 NOTE — Progress Notes (Signed)
PROGRESS NOTE        PATIENT DETAILS Name: Deborah [name] Age: 57 y.o. Sex: female Date of Birth: 11/19/1965 Admit Date: 01/29/2023 Admitting Physician Meredeth Ide, MD PCP:Pcp, No  Brief Summary: Patient is a 57 y.o.  female with history of ESRD on HD MWF, PAF-not on any anticoagulation due to recent GI bleeding, HFpEF, DM-2, HTN, chronic pain syndrome on narcotics-who underwent BKA 10/23-presented with a right BKA stump dehiscence.  Significant events: 11/23>> admit to [facility].  Significant studies: None  Significant microbiology data: 11/23>> COVID/influenza/RSV PCR: Negative 11/24>> blood culture: No growth  Procedures: None  Consults: Orthopedics  Subjective: Complains of some involuntary muscle movements today-affecting her limbs-mostly right side.  Thinks it could be from Neurontin.  Chronic pain issues are stable.  Appears comfortable.  Denies any hematochezia or melena.  Objective: Vitals: Blood pressure 105/72, pulse (!) 108, temperature 98.3 F (36.8 C), temperature source Oral, resp. rate 19, height 5\' 6"  (1.676 m), weight 117.2 kg, last menstrual period 10/10/2012, SpO2 95%.   Exam: Gen Exam:Alert awake-not in any distress HEENT:atraumatic, normocephalic Chest: B/L clear to auscultation anteriorly CVS:S1S2 regular Abdomen:soft non tender, non distended Extremities:no edema-right BKA stump in dressing. Neurology: Non focal Skin: no rash  Pertinent Labs/Radiology:    Latest Ref Rng & Units 01/31/2023    7:50 AM 01/30/2023    4:34 AM 01/29/2023   11:20 PM  CBC  WBC 4.0 - 10.5 K/uL 14.3  15.2  15.7   Hemoglobin 12.0 - 15.0 g/dL 6.8  7.4  8.4   Hematocrit 36.0 - 46.0 % 23.1  24.1  27.7   Platelets 150 - 400 K/uL 342  398  332     Lab Results  Component Value Date   NA 135 01/31/2023   K 3.4 (L) 01/31/2023   CL 101 01/31/2023   CO2 27 01/31/2023      Assessment/Plan: Sepsis due to right BKA stump infection with  wound dehiscence (recent right calcaneal osteomyelitis requiring BKA 10/23) Sepsis physiology has resolved-mild leukocytosis today Culture negative so far Continue IV antibiotics Orthopedics planning on BKA revision 11/27.  ESRD on HD MWF Missed HD Wednesday/Friday Nephrology following and directing care  Hypokalemia Replete/recheck  ?  Involuntary muscle movement today-mostly on the right upper/lower extremity Claims this started yesterday Stopping Neurontin-see how she does-if no improvement-will ask neurology to evaluate Note-has a history of tardive dyskinesia but currently does not have any facial twitching/involvement.  Normocytic anemia Secondary to ESRD-recent history of GI bleeding Hemoglobin down to 6.8 today-discussed with patient/nursing staff-no hematochezia/melena Transfuse 1 unit of PRBC today Follow CBC  History of GI bleeding October 2024-thought to be due to AVMs noted on EGD PPI Follow CBC No overt GI bleeding noted-see above  History of NASH Supportive care  PAF Telemetry monitoring No longer on anticoagulation due to recent GI bleeding Metoprolol  Chronic HFpEF Volume status stable Volume removal with HD  DM-2 (A1c 8.1 on 4/13) Start SSI/follow CBG  Recent Labs    01/30/23 1659 01/30/23 2125 01/31/23 0811  GLUCAP 123* 149* 109*     Chronic pain syndrome Continue narcotics-on Dilaudid at home-follows with pain management in the outpatient setting.  History of malignant duodenal carcinoid-s/p resection Stable for continued outpatient follow-up with oncology/PCP  History of tardive dyskinesia Supportive care  Anxiety/mood disorder Resume Remeron/BuSpar As needed Atarax  Debility/deconditioning PT/OT eval  Morbid Obesity: Estimated body mass index is 41.7 kg/m as calculated from the following:   Height as of this encounter: 5\' 6"  (1.676 m).   Weight as of this encounter: 117.2 kg.   Code status:   Code Status: Full Code    DVT Prophylaxis: heparin injection 5,000 Units Start: 01/29/23 2200   Family Communication: None at bedside   Disposition Plan: Status is: Inpatient Remains inpatient appropriate because: Severity of illness   Planned Discharge Destination:Home   Diet: Diet Order             Diet Carb Modified Fluid consistency: Thin; Room service appropriate? Yes  Diet effective now                     Antimicrobial agents: Anti-infectives (From admission, onward)    Start     Dose/Rate Route Frequency Ordered Stop   01/30/23 1800  ceFEPIme (MAXIPIME) 1 g in sodium chloride 0.9 % 100 mL IVPB        1 g 200 mL/hr over 30 Minutes Intravenous Every 24 hours 01/29/23 1705     01/30/23 1700  vancomycin (VANCOCIN) IVPB 1000 mg/200 mL premix        1,000 mg 200 mL/hr over 60 Minutes Intravenous Once per day on Sunday Tuesday Friday 01/30/23 1126     01/29/23 1945  vancomycin (VANCOCIN) IVPB 1000 mg/200 mL premix        1,000 mg 200 mL/hr over 60 Minutes Intravenous Every 1 hr x 2 01/29/23 1846 01/29/23 2205   01/29/23 1930  vancomycin (VANCOREADY) IVPB 2000 mg/400 mL  Status:  Discontinued        2,000 mg 200 mL/hr over 120 Minutes Intravenous  Once 01/29/23 1840 01/29/23 1846   01/29/23 1630  vancomycin (VANCOCIN) IVPB 1000 mg/200 mL premix  Status:  Discontinued       Placed in "Followed by" Linked Group   1,000 mg 200 mL/hr over 60 Minutes Intravenous  Once 01/29/23 1512 01/29/23 1944   01/29/23 1515  ceFEPIme (MAXIPIME) 1 g in sodium chloride 0.9 % 100 mL IVPB        1 g 200 mL/hr over 30 Minutes Intravenous  Once 01/29/23 1512 01/29/23 1839   01/29/23 1515  metroNIDAZOLE (FLAGYL) IVPB 500 mg        50 0 mg 100 mL/hr over 60 Minutes Intravenous  Once 01/29/23 1512 01/29/23 1736   01/29/23 1515  vancomycin (VANCOCIN) IVPB 1000 mg/200 mL premix  Status:  Discontinued       Placed in "Followed by" Linked Group   1,000 mg 200 mL/hr over 60 Minutes Intravenous  Once 01/29/23 1512  01/29/23 1945        MEDICATIONS: Scheduled Meds:  sodium chloride   Intravenous Once   busPIRone  5 mg Oral TID   darbepoetin (ARANESP) injection - DIALYSIS  100 mcg Subcutaneous Q Sat-1800   ferric citrate  210 mg Oral TID WC   Gerhardt's butt cream   Topical TID   heparin  5,000 Units Subcutaneous Q8H   insulin aspart  0-6 Units Subcutaneous TID WC   metoprolol tartrate  50 mg Oral BID   midodrine  10 mg Oral TID WC   mirtazapine  15 mg Oral QHS   pantoprazole  40 mg Oral BID   sodium chloride flush  3 mL Intravenous Q12H   Continuous Infusions:  ceFEPime (MAXIPIME) IV 1 g (01/30/23 2235)   vancomycin 1,000 mg (01/30/23  1712)   PRN Meds:.acetaminophen **OR** acetaminophen, HYDROmorphone (DILAUDID) injection, hydrOXYzine, loperamide, methocarbamol, ondansetron **OR** ondansetron (ZOFRAN) IV, sodium chloride flush   I have personally reviewed following labs and imaging studies  LABORATORY DATA: CBC: Recent Labs  Lab 01/27/23 1355 01/29/23 1430 01/29/23 2320 01/30/23 0434 01/31/23 0750  WBC 15.3* 18.7* 15.7* 15.2* 14.3*  NEUTROABS 12.1* 14.1*  --   --   --   HGB 7.3* 7.8* 8.4* 7.4* 6.8*  HCT 23.7* 25.7* 27.7* 24.1* 23.1*  MCV 94.4 96.3 95.8 94.9 96.3  PLT 403* 425* 332 398 342    Basic Metabolic Panel: Recent Labs  Lab 01/27/23 1355 01/29/23 1430 01/29/23 2320 01/30/23 0434 01/31/23 0750  NA 138 141  --  139 135  K 3.1* 2.8*  --  3.4* 3.4*  CL 102 107  --  107 101  CO2 24 23  --  21* 27  GLUCOSE 123* 138*  --  103* 125*  BUN 13 16  --  17 11  CREATININE 5.39* 6.45* 5.50* 5.91* 4.09*  CALCIUM 8.3* 8.2*  --  7.9* 7.9*  PHOS  --   --  4.4  --  3.4    GFR: Estimated Creatinine Clearance: 19.5 mL/min (A) (by C-G formula based on SCr of 4.09 mg/dL (H)).  Liver Function Tests: Recent Labs  Lab 01/27/23 1355 01/30/23 0434 01/31/23 0750  AST 17 15  --   ALT 10 9  --   ALKPHOS 89 97  --   BILITOT 0.8 0.7  --   PROT 6.6 6.2*  --   ALBUMIN 2.0* 2.0*  1.8*   Recent Labs  Lab 01/27/23 1355  LIPASE 21   No results for input(s): "AMMONIA" in the last 168 hours.  Coagulation Profile: No results for input(s): "INR", "PROTIME" in the last 168 hours.  Cardiac Enzymes: No results for input(s): "CKTOTAL", "CKMB", "CKMBINDEX", "TROPONINI" in the last 168 hours.  BNP (last 3 results) No results for input(s): "PROBNP" in the last 8760 hours.  Lipid Profile: No results for input(s): "CHOL", "HDL", "LDLCALC", "TRIG", "CHOLHDL", "LDLDIRECT" in the last 72 hours.  Thyroid Function Tests: No results for input(s): "TSH", "T4TOTAL", "FREET4", "T3FREE", "THYROIDAB" in the last 72 hours.  Anemia Panel: No results for input(s): "VITAMINB12", "FOLATE", "FERRITIN", "TIBC", "IRON", "RETICCTPCT" in the last 72 hours.  Urine analysis:    Component Value Date/Time   COLORURINE STRAW (A) 06/19/2022 0638   APPEARANCEUR HAZY (A) 06/19/2022 0638   LABSPEC 1.010 06/19/2022 0638   PHURINE 5.0 06/19/2022 0638   GLUCOSEU NEGATIVE 06/19/2022 0638   HGBUR SMALL (A) 06/19/2022 0638   BILIRUBINUR NEGATIVE 06/19/2022 0638   KETONESUR NEGATIVE 06/19/2022 0638   PROTEINUR 30 (A) 06/19/2022 0638   UROBILINOGEN 0.2 10/02/2013 2108   NITRITE NEGATIVE 06/19/2022 0638   LEUKOCYTESUR LARGE (A) 06/19/2022 0638    Sepsis Labs: Lactic Acid, Venous    Component Value Date/Time   LATICACIDVEN 1.6 01/30/2023 0543    MICROBIOLOGY: Recent Results (from the past 240 hour(s))  Blood culture (routine x 2)     Status: None   Collection Time: 01/21/23  7:01 PM   Specimen: BLOOD  Result Value Ref Range Status   Specimen Description BLOOD SITE NOT SPECIFIED  Final   Special Requests BLOOD Blood Culture adequate volume  Final   Culture   Final    NO GROWTH 5 DAYS Performed at [facility], [address] [place], [place] [address]    Report Status 01/26/2023 FINAL  Final  Blood culture (routine x 2)     Status: None   Collection Time: 01/21/23  7:05 PM    Specimen: BLOOD  Result Value Ref Range Status   Specimen Description BLOOD RIGHT ANTECUBITAL  Final   Special Requests   Final    BOTTLES DRAWN AEROBIC AND ANAEROBIC Blood Culture results may not be optimal due to an inadequate volume of blood received in culture bottles   Culture   Final    NO GROWTH 5 DAYS Performed at [facility], [address], [place], [place] [address]    Report Status 01/26/2023 FINAL  Final  Blood culture (routine x 2)     Status: None (Preliminary result)   Collection Time: 01/29/23  2:19 PM   Specimen: BLOOD  Result Value Ref Range Status   Specimen Description BLOOD SITE NOT SPECIFIED  Final   Special Requests   Final    BOTTLES DRAWN AEROBIC AND ANAEROBIC Blood Culture adequate volume   Culture   Final    NO GROWTH 2 DAYS Performed at [facility], [address] [place], [place] [address]    Report Status PENDING  Incomplete  Resp panel by RT-PCR (RSV, Flu A&B, Covid) Anterior Nasal Swab     Status: None   Collection Time: 01/29/23  3:05 PM   Specimen: Anterior Nasal Swab  Result Value Ref Range Status   SARS Coronavirus 2 by RT PCR NEGATIVE NEGATIVE Final   Influenza A by PCR NEGATIVE NEGATIVE Final   Influenza B by PCR NEGATIVE NEGATIVE Final    Comment: (NOTE) The Xpert Xpress SARS-CoV-2/FLU/RSV plus assay is intended as an aid in the diagnosis of influenza from Nasopharyngeal swab specimens and should not be used as a sole basis for treatment. Nasal washings and aspirates are unacceptable for Xpert Xpress SARS-CoV-2/FLU/RSV testing.  Fact Sheet for Patients: BloggerCourse.com  Fact Sheet for Healthcare Providers: SeriousBroker.it  This test is not yet approved or cleared by the [place] FDA and has been authorized for detection and/or diagnosis of SARS-CoV-2 by FDA under an Emergency Use Authorization (EUA). This EUA will remain in effect (meaning this test can be used)  for the duration of the COVID-19 declaration under Section 564(b)(1) of the Act, 21 U.S.C. section 360bbb-3(b)(1), unless the authorization is terminated or revoked.     Resp Syncytial Virus by PCR NEGATIVE NEGATIVE Final    Comment: (NOTE) Fact Sheet for Patients: BloggerCourse.com  Fact Sheet for Healthcare Providers: SeriousBroker.it  This test is not yet approved or cleared by the [place] FDA and has been authorized for detection and/or diagnosis of SARS-CoV-2 by FDA under an Emergency Use Authorization (EUA). This EUA will remain in effect (meaning this test can be used) for the duration of the COVID-19 declaration under Section 564(b)(1) of the Act, 21 U.S.C. section 360bbb-3(b)(1), unless the authorization is terminated or revoked.  Performed at [facility], [address] [place], [place] [address]   Blood culture (routine x 2)     Status: None (Preliminary result)   Collection Time: 01/30/23  4:36 AM   Specimen: BLOOD RIGHT HAND  Result Value Ref Range Status   Specimen Description BLOOD RIGHT HAND  Final   Special Requests   Final    BOTTLES DRAWN AEROBIC ONLY Blood Culture results may not be optimal due to an inadequate volume of blood received in culture bottles   Culture   Final    NO GROWTH 1 DAY  Performed at [facility], [address], [place], [place] [address]    Report Status PENDING  Incomplete    RADIOLOGY STUDIES/RESULTS: No results found.   LOS: 2 days   Jeoffrey Massed, MD  [facility]    To contact the attending provider between 7A-7P or the covering provider during after hours 7P-7A, please log into the web site [email or web address] and access using universal [place] password for that web site. If you do not have the password, please call the hospital operator.  01/31/2023, 11:39 AM

## 2023-01-31 NOTE — Plan of Care (Signed)
I was called by Dr. Jerral Ralph to evaluate the patient for tremors and jerking of the whole body.  She had distractible jerking of the whole body including arms and legs with easy distractibility while tending to her wound. Does not appear like TD. Exam appears very functional. If she continues to have these episodes, can consider hooking her up to EEG to further evaluate but at this point, I think the treatment is mostly supportive.  Can use Ativan as needed.   -- Milon Dikes, MD Neurologist [facility]

## 2023-01-31 NOTE — Progress Notes (Signed)
[facility] Progress Note   Subjective: Seen in room, Mother at bedside. Asked her to wear her hospital gown. No C/Os. HD tomorrow on Holiday schedule.      Objective Vitals:   01/30/23 2334 01/31/23 0006 01/31/23 0451 01/31/23 0811  BP: (!) 110/53 (!) 92/53 93/60 105/72  Pulse:  (!) 113 (!) 110 (!) 108  Resp: 19 16 20 19   Temp: 97.7 F (36.5 C) 97.7 F (36.5 C) 97.8 F (36.6 C) 98.3 F (36.8 C)  TempSrc: Oral Oral Oral Oral  SpO2:  95%    Weight:      Height:       Physical Exam General: Chronically ill appearing female in NAD Heart: S1,S2 RRR NO M/R/G Lungs: CTAB Abdomen: Obese NABS Extremities: R BKA with ACE wrap in place. No LLE edema Dialysis Access: RIJ TDC drsg intact  Additional Objective Labs: Basic Metabolic Panel: Recent Labs  Lab 01/29/23 1430 01/29/23 2320 01/30/23 0434 01/31/23 0750  NA 141  --  139 135  K 2.8*  --  3.4* 3.4*  CL 107  --  107 101  CO2 23  --  21* 27  GLUCOSE 138*  --  103* 125*  BUN 16  --  17 11  CREATININE 6.45* 5.50* 5.91* 4.09*  CALCIUM 8.2*  --  7.9* 7.9*  PHOS  --  4.4  --  3.4   Liver Function Tests: Recent Labs  Lab 01/27/23 1355 01/30/23 0434 01/31/23 0750  AST 17 15  --   ALT 10 9  --   ALKPHOS 89 97  --   BILITOT 0.8 0.7  --   PROT 6.6 6.2*  --   ALBUMIN 2.0* 2.0* 1.8*   Recent Labs  Lab 01/27/23 1355  LIPASE 21   CBC: Recent Labs  Lab 01/27/23 1355 01/29/23 1430 01/29/23 2320 01/30/23 0434 01/31/23 0750  WBC 15.3* 18.7* 15.7* 15.2* 14.3*  NEUTROABS 12.1* 14.1*  --   --   --   HGB 7.3* 7.8* 8.4* 7.4* 6.8*  HCT 23.7* 25.7* 27.7* 24.1* 23.1*  MCV 94.4 96.3 95.8 94.9 96.3  PLT 403* 425* 332 398 342   Blood Culture    Component Value Date/Time   SDES BLOOD RIGHT HAND 01/30/2023 0436   SPECREQUEST  01/30/2023 0436    BOTTLES DRAWN AEROBIC ONLY Blood Culture results may not be optimal due to an inadequate volume of blood received in culture bottles   CULT  01/30/2023 0436     NO GROWTH 1 DAY Performed at [facility], [address], [place], [place] [address]    REPTSTATUS PENDING 01/30/2023 [phone]    Cardiac Enzymes: No results for input(s): "CKTOTAL", "CKMB", "CKMBINDEX", "TROPONINI" in the last 168 hours. CBG: Recent Labs  Lab 01/30/23 1209 01/30/23 1659 01/30/23 2125 01/31/23 0811  GLUCAP 112* 123* 149* 109*   Iron Studies: No results for input(s): "IRON", "TIBC", "TRANSFERRIN", "FERRITIN" in the last 72 hours. @lablastinr3 @ Studies/Results: No results found. Medications:  ceFEPime (MAXIPIME) IV 1 g (01/30/23 2235)   vancomycin 1,000 mg (01/30/23 1712)    sodium chloride   Intravenous Once   busPIRone  5 mg Oral TID   darbepoetin (ARANESP) injection - DIALYSIS  100 mcg Subcutaneous Q Sat-1800   ferric citrate  210 mg Oral TID WC   Gerhardt's butt cream   Topical TID   heparin  5,000 Units Subcutaneous Q8H   insulin aspart  0-6 Units Subcutaneous TID WC   metoprolol tartrate  50 mg Oral BID   midodrine  10 mg Oral TID WC   mirtazapine  15 mg Oral QHS   pantoprazole  40 mg Oral BID   polyethylene glycol  17 g Oral Daily   potassium chloride  40 mEq Oral Once   senna-docusate  2 tablet Oral QHS   sodium chloride flush  3 mL Intravenous Q12H     Dialysis Orders:  MWF NW  4h  400/800  121kg   3K/2.5Ca bath   TDC   Heparin none - last OP HD 11/18, post wt 124.6kg  - has been usually reaching dry wt - missed 2 sessions in last 3 wks 11/20 and 11/13 - was in hospital 10/9- 11/04 here  - dry wt lowered 6 kg 2 wks ago - venofer 100mg  q hd thru 12/04 - mircera 100 mcg q 2 wks, last 10/5, due 10/19     Labs --> Na 141  K 2.8  BUN 16,  creat 6.45    wBC 18  hb 7.8   Assessment/Plan: Sepsis - presenting w/ SIRS, due to dehisced [name]. IV abx started and ortho consulted. Per pmd.  ESKD - on HD MWF. Missed HD due to illness as above. Planned for HD today, due to the holiday her schedule will be Sunday, Tuesday, Friday this week.   HTN - bp's wnl, takes metoprolol at home. Cont meds as needed.  Volume - no gross vol excess on exam. Plan UF 2.5 L w/ HD  Anemia of eskd - HGB 6.8. Will transfuse 1 unit PRBCS with HD tomorrow.  Overdue for esa, will start darbe here at 100 mcg weekly. Tranfuse prn.  MBD ckd - CCa in range. Phos at goal. Continue binders.     Delitha Elms H. Fotini Lemus NP-C 01/31/2023, 12:09 PM  [employer] [phone]

## 2023-01-31 NOTE — Progress Notes (Signed)
Informed by RN-patient  having generalized tremors/jerking activity. Arrived at bedside-vital signs stable-CBGs> 100 When distracted-these tremors/jerking activity abate/decreased significant.  She is able to answer questions/follow commands when having these movements. Neurology-Dr. Wilford Corner came to the side to observe (history of tardive dyskinesia)-this appears to be nonorganic-recommendations are to treat with as needed IV Ativan.

## 2023-01-31 NOTE — Plan of Care (Signed)
  Problem: Education: Goal: Knowledge of General Education information will improve Description: Including pain rating scale, medication(s)/side effects and non-pharmacologic comfort measures Outcome: Progressing   Problem: Health Behavior/Discharge Planning: Goal: Ability to manage health-related needs will improve Outcome: Progressing   Problem: Clinical Measurements: Goal: Ability to maintain clinical measurements within normal limits will improve Outcome: Progressing Goal: Diagnostic test results will improve Outcome: Progressing Goal: Respiratory complications will improve Outcome: Progressing Goal: Cardiovascular complication will be avoided Outcome: Progressing   Problem: Activity: Goal: Risk for activity intolerance will decrease Outcome: Progressing   Problem: Coping: Goal: Level of anxiety will decrease Outcome: Progressing   Problem: Pain Management: Goal: General experience of comfort will improve Outcome: Progressing

## 2023-02-01 ENCOUNTER — Encounter: Payer: 59 | Admitting: Registered Nurse

## 2023-02-01 ENCOUNTER — Encounter: Payer: 59 | Admitting: Orthopedic Surgery

## 2023-02-01 LAB — GLUCOSE, CAPILLARY
Glucose-Capillary: 121 mg/dL — ABNORMAL HIGH (ref 70–99)
Glucose-Capillary: 195 mg/dL — ABNORMAL HIGH (ref 70–99)
Glucose-Capillary: 195 mg/dL — ABNORMAL HIGH (ref 70–99)

## 2023-02-01 LAB — RENAL FUNCTION PANEL
Albumin: 1.7 g/dL — ABNORMAL LOW (ref 3.5–5.0)
Anion gap: 10 (ref 5–15)
BUN: 15 mg/dL (ref 6–20)
CO2: 23 mmol/L (ref 22–32)
Calcium: 7.9 mg/dL — ABNORMAL LOW (ref 8.9–10.3)
Chloride: 104 mmol/L (ref 98–111)
Creatinine, Ser: 4.71 mg/dL — ABNORMAL HIGH (ref 0.44–1.00)
GFR, Estimated: 10 mL/min — ABNORMAL LOW (ref >60)
Glucose, Bld: 171 mg/dL — ABNORMAL HIGH (ref 70–99)
Phosphorus: 4.1 mg/dL (ref 2.5–4.6)
Potassium: 3.3 mmol/L — ABNORMAL LOW (ref 3.5–5.1)
Sodium: 137 mmol/L (ref 135–145)

## 2023-02-01 LAB — CBC
HCT: 24.9 % — ABNORMAL LOW (ref 36.0–46.0)
Hemoglobin: 7.7 g/dL — ABNORMAL LOW (ref 12.0–15.0)
MCH: 29.3 pg (ref 26.0–34.0)
MCHC: 30.9 g/dL (ref 30.0–36.0)
MCV: 94.7 fL (ref 80.0–100.0)
Platelets: 340 10*3/uL (ref 150–400)
RBC: 2.63 MIL/uL — ABNORMAL LOW (ref 3.87–5.11)
RDW: 16 % — ABNORMAL HIGH (ref 11.5–15.5)
WBC: 16.7 10*3/uL — ABNORMAL HIGH (ref 4.0–10.5)
nRBC: 0 % (ref 0.0–0.2)

## 2023-02-01 LAB — PREPARE RBC (CROSSMATCH)

## 2023-02-01 LAB — HEPATITIS B SURFACE ANTIBODY, QUANTITATIVE: Hep B S AB Quant (Post): 1951 m[IU]/mL

## 2023-02-01 MED ORDER — ANTICOAGULANT SODIUM CITRATE 4% (200MG/5ML) IV SOLN: 5.0000 mL | Status: DC | PRN

## 2023-02-01 MED ORDER — NEPRO/CARBSTEADY PO LIQD: 237.0000 mL | ORAL | Status: DC | PRN

## 2023-02-01 MED ORDER — HEPARIN SODIUM (PORCINE) 1000 UNIT/ML DIALYSIS: 1000.0000 [IU] | INTRAMUSCULAR | Status: DC | PRN

## 2023-02-01 MED ORDER — TRANEXAMIC ACID 1000 MG/10ML IV SOLN
2000.0000 mg | INTRAVENOUS | Status: DC
  Filled 2023-02-01: qty 20

## 2023-02-01 MED ORDER — HEPARIN SODIUM (PORCINE) 1000 UNIT/ML IJ SOLN
INTRAMUSCULAR | Status: AC
  Filled 2023-02-01: qty 4

## 2023-02-01 MED ORDER — POTASSIUM CHLORIDE CRYS ER 20 MEQ PO TBCR: 40.0000 meq | EXTENDED_RELEASE_TABLET | Freq: Once | ORAL | Status: DC

## 2023-02-01 MED ORDER — ALTEPLASE 2 MG IJ SOLR: 2.0000 mg | Freq: Once | INTRAMUSCULAR | Status: DC | PRN

## 2023-02-01 MED ORDER — SODIUM CHLORIDE 0.9% IV SOLUTION: Freq: Once | INTRAVENOUS | Status: DC

## 2023-02-01 NOTE — Procedures (Signed)
HD Note:  Some information was entered later than the data was gathered due to patient care needs. The stated time with the data is accurate.  Received patient in bed to unit.   Alert and oriented. Patient refused to cover her nose with the mask while the catheter was being accessed.  Education provided with no change in behavior  Informed consent signed and in chart.   Access used: Right upper chest HD catheter. Access issues: None  Patient tolerated treatment well.   She received a unit of Blood totalling 400 ml and vancomycin 100 ml  TX duration:  Alert, without acute distress.  Total UF removed: 2500 ml  Hand-off given to patient's nurse.   Transported back to the room   Deborah [name] L. Dareen Piano, RN Kid[facility] Dialysis Unit.

## 2023-02-01 NOTE — Progress Notes (Signed)
PROGRESS NOTE        PATIENT DETAILS Name: SARAE STEINKAMP Age: 57 y.o. Sex: female Date of Birth: 1965/11/15 Admit Date: 01/29/2023 Admitting Physician Meredeth Ide, MD PCP:Pcp, No  Brief Summary: Patient is a 57 y.o.  female with history of ESRD on HD MWF, PAF-not on any anticoagulation due to recent GI bleeding, HFpEF, DM-2, HTN, chronic pain syndrome on narcotics-who underwent BKA 10/23-presented with a right BKA stump dehiscence.  Significant events: 11/23>> admit to [facility]  Significant studies: None  Significant microbiology data: 11/23>> COVID/influenza/RSV PCR: Negative 11/24>> blood culture: No growth  Procedures: None  Consults: Orthopedics  Subjective: Completely awake/alert-no further generalized body tremors/jerking movements noted.  No family at bedside.  Objective: Vitals: Blood pressure 119/67, pulse 81, temperature 98.1 F (36.7 C), resp. rate 18, height 5\' 6"  (1.676 m), weight 126.5 kg, last menstrual period 10/10/2012, SpO2 100%.   Exam: Gen Exam:Alert awake-not in any distress HEENT:atraumatic, normocephalic Chest: B/L clear to auscultation anteriorly CVS:S1S2 regular Abdomen:soft non tender, non distended Extremities: Right BKA-stump in dressing. Neurology: Non focal Skin: no rash  Pertinent Labs/Radiology:    Latest Ref Rng & Units 02/01/2023    4:33 AM 01/31/2023   11:25 PM 01/31/2023    7:50 AM  CBC  WBC 4.0 - 10.5 K/uL 16.7   14.3   Hemoglobin 12.0 - 15.0 g/dL 7.7  7.6  6.8   Hematocrit 36.0 - 46.0 % 24.9  24.8  23.1   Platelets 150 - 400 K/uL 340   342     Lab Results  Component Value Date   NA 137 02/01/2023   K 3.3 (L) 02/01/2023   CL 104 02/01/2023   CO2 23 02/01/2023      Assessment/Plan: Sepsis due to right BKA stump infection with wound dehiscence (recent right calcaneal osteomyelitis requiring BKA 10/23) Sepsis physiology has resolved-mild leukocytosis today Culture negative so  far Continue IV antibiotics Orthopedics planning on BKA revision 11/27.  ESRD on HD MWF Missed HD Wednesday/Friday Nephrology following and directing care  Hypokalemia Continue to replete/recheck  Generalized body tremors/jerking movement Occurred 11/25 Thought to be functional-resolved-neurology briefly curb sided at bedside.   As needed IV Ativan  Although has history of tardive dyskinesia-this was not felt to be tardive dyskinesia.    Normocytic anemia Secondary to ESRD-recent history of GI bleeding Hb down to 6.8 on 11/25-transfused 1 unit of PRBC on 11/25-Hb stable at 7.7 No overt GI bleeding noted-discussed with nursing staff- Continue to follow CBC periodically.  History of GI bleeding October 2024-thought to be due to AVMs noted on EGD PPI Follow CBC No overt GI bleeding noted-see above  History of NASH Supportive care  PAF Telemetry monitoring No longer on anticoagulation due to recent GI bleeding/severity of anemia Metoprolol  Chronic HFpEF Volume status stable Volume removal with HD  DM-2 (A1c 8.1 on 4/13) Start SSI/follow CBG  Recent Labs    01/31/23 1605 01/31/23 1654 01/31/23 2129  GLUCAP 192* 165* 176*     Chronic pain syndrome Continue narcotics-on Dilaudid at home-follows with pain management in the outpatient setting.  History of malignant duodenal carcinoid-s/p resection Stable for continued outpatient follow-up with oncology/PCP  History of tardive dyskinesia Supportive care  Anxiety/mood disorder Resume Remeron/BuSpar As needed Atarax  Debility/deconditioning PT/OT eval  Morbid Obesity: Estimated body mass index is 45.01 kg/m as calculated  from the following:   Height as of this encounter: 5\' 6"  (1.676 m).   Weight as of this encounter: 126.5 kg.   Code status:   Code Status: Full Code   DVT Prophylaxis: heparin injection 5,000 Units Start: 01/29/23 2200   Family Communication: Significant other at bedside on  11/25-none at bedside this morning.  Disposition Plan: Status is: Inpatient Remains inpatient appropriate because: Severity of illness   Planned Discharge Destination:Home   Diet: Diet Order             Diet Carb Modified Fluid consistency: Thin; Room service appropriate? Yes  Diet effective now                     Antimicrobial agents: Anti-infectives (From admission, onward)    Start     Dose/Rate Route Frequency Ordered Stop   01/31/23 1700  cefTRIAXone (ROCEPHIN) 2 g in sodium chloride 0.9 % 100 mL IVPB        2 g 200 mL/hr over 30 Minutes Intravenous Daily 01/31/23 1644     01/30/23 1800  ceFEPIme (MAXIPIME) 1 g in sodium chloride 0.9 % 100 mL IVPB  Status:  Discontinued        1 g 200 mL/hr over 30 Minutes Intravenous Every 24 hours 01/29/23 1705 01/31/23 1644   01/30/23 1700  vancomycin (VANCOCIN) IVPB 1000 mg/200 mL premix        1,000 mg 200 mL/hr over 60 Minutes Intravenous Once per day on Sunday Tuesday Friday 01/30/23 1126     01/29/23 1945  vancomycin (VANCOCIN) IVPB 1000 mg/200 mL premix        1,000 mg 200 mL/hr over 60 Minutes Intravenous Every 1 hr x 2 01/29/23 1846 01/29/23 2205   01/29/23 1930  vancomycin (VANCOREADY) IVPB 2000 mg/400 mL  Status:  Discontinued        2,000 mg 200 mL/hr over 120 Minutes Intravenous  Once 01/29/23 1840 01/29/23 1846   01/29/23 1630  vancomycin (VANCOCIN) IVPB 1000 mg/200 mL premix  Status:  Discontinued       Placed in "Followed by" Linked Group   1,000 mg 200 mL/hr over 60 Minutes Intravenous  Once 01/29/23 1512 01/29/23 1944   01/29/23 1515  ceFEPIme (MAXIPIME) 1 g in sodium chloride 0.9 % 100 mL IVPB        1 g 200 mL/hr over 30 Minutes Intravenous  Once 01/29/23 1512 01/29/23 1839   01/29/23 1515  metroNIDAZOLE (FLAGYL) IVPB 500 mg        50 0 mg 100 mL/hr over 60 Minutes Intravenous  Once 01/29/23 1512 01/29/23 1736   01/29/23 1515  vancomycin (VANCOCIN) IVPB 1000 mg/200 mL premix  Status:  Discontinued        Placed in "Followed by" Linked Group   1,000 mg 200 mL/hr over 60 Minutes Intravenous  Once 01/29/23 1512 01/29/23 1945        MEDICATIONS: Scheduled Meds:  sodium chloride   Intravenous Once   busPIRone  5 mg Oral TID   darbepoetin (ARANESP) injection - DIALYSIS  100 mcg Subcutaneous Q Sat-1800   ferric citrate  210 mg Oral TID WC   Gerhardt's butt cream   Topical TID   heparin  5,000 Units Subcutaneous Q8H   insulin aspart  0-6 Units Subcutaneous TID WC   metoprolol tartrate  50 mg Oral BID   midodrine  10 mg Oral TID WC   mirtazapine  15 mg Oral QHS   pantoprazole  40 mg Oral BID   polyethylene glycol  17 g Oral Daily   potassium chloride  40 mEq Oral Once   senna-docusate  2 tablet Oral QHS   sodium chloride flush  3 mL Intravenous Q12H   Continuous Infusions:  anticoagulant sodium citrate     cefTRIAXone (ROCEPHIN)  IV 2 g (01/31/23 2238)   vancomycin 1,000 mg (01/30/23 1712)   PRN Meds:.acetaminophen **OR** acetaminophen, alteplase, anticoagulant sodium citrate, bisacodyl, feeding supplement (NEPRO CARB STEADY), heparin, HYDROmorphone (DILAUDID) injection, hydrOXYzine, loperamide, LORazepam, methocarbamol, ondansetron **OR** ondansetron (ZOFRAN) IV, sodium chloride flush   I have personally reviewed following labs and imaging studies  LABORATORY DATA: CBC: Recent Labs  Lab 01/27/23 1355 01/29/23 1430 01/29/23 2320 01/30/23 0434 01/31/23 0750 01/31/23 2325 02/01/23 0433  WBC 15.3* 18.7* 15.7* 15.2* 14.3*  --  16.7*  NEUTROABS 12.1* 14.1*  --   --   --   --   --   HGB 7.3* 7.8* 8.4* 7.4* 6.8* 7.6* 7.7*  HCT 23.7* 25.7* 27.7* 24.1* 23.1* 24.8* 24.9*  MCV 94.4 96.3 95.8 94.9 96.3  --  94.7  PLT 403* 425* 332 398 342  --  340    Basic Metabolic Panel: Recent Labs  Lab 01/27/23 1355 01/29/23 1430 01/29/23 2320 01/30/23 0434 01/31/23 0750 02/01/23 0433  NA 138 141  --  139 135 137  K 3.1* 2.8*  --  3.4* 3.4* 3.3*  CL 102 107  --  107 101 104  CO2 24  23  --  21* 27 23  GLUCOSE 123* 138*  --  103* 125* 171*  BUN 13 16  --  17 11 15   CREATININE 5.39* 6.45* 5.50* 5.91* 4.09* 4.71*  CALCIUM 8.3* 8.2*  --  7.9* 7.9* 7.9*  PHOS  --   --  4.4  --  3.4 4.1    GFR: Estimated Creatinine Clearance: 17.7 mL/min (A) (by C-G formula based on SCr of 4.71 mg/dL (H)).  Liver Function Tests: Recent Labs  Lab 01/27/23 1355 01/30/23 0434 01/31/23 0750 02/01/23 0433  AST 17 15  --   --   ALT 10 9  --   --   ALKPHOS 89 97  --   --   BILITOT 0.8 0.7  --   --   PROT 6.6 6.2*  --   --   ALBUMIN 2.0* 2.0* 1.8* 1.7*   Recent Labs  Lab 01/27/23 1355  LIPASE 21   No results for input(s): "AMMONIA" in the last 168 hours.  Coagulation Profile: No results for input(s): "INR", "PROTIME" in the last 168 hours.  Cardiac Enzymes: No results for input(s): "CKTOTAL", "CKMB", "CKMBINDEX", "TROPONINI" in the last 168 hours.  BNP (last 3 results) No results for input(s): "PROBNP" in the last 8760 hours.  Lipid Profile: No results for input(s): "CHOL", "HDL", "LDLCALC", "TRIG", "CHOLHDL", "LDLDIRECT" in the last 72 hours.  Thyroid Function Tests: No results for input(s): "TSH", "T4TOTAL", "FREET4", "T3FREE", "THYROIDAB" in the last 72 hours.  Anemia Panel: No results for input(s): "VITAMINB12", "FOLATE", "FERRITIN", "TIBC", "IRON", "RETICCTPCT" in the last 72 hours.  Urine analysis:    Component Value Date/Time   COLORURINE STRAW (A) 06/19/2022 0638   APPEARANCEUR HAZY (A) 06/19/2022 0638   LABSPEC 1.010 06/19/2022 0638   PHURINE 5.0 06/19/2022 0638   GLUCOSEU NEGATIVE 06/19/2022 0638   HGBUR SMALL (A) 06/19/2022 0638   BILIRUBINUR NEGATIVE 06/19/2022 0638   KETONESUR NEGATIVE 06/19/2022 0638   PROTEINUR 30 (A) 06/19/2022 8469  UROBILINOGEN 0.2 10/02/2013 2108   NITRITE NEGATIVE 06/19/2022 0638   LEUKOCYTESUR LARGE (A) 06/19/2022 0638    Sepsis Labs: Lactic Acid, Venous    Component Value Date/Time   LATICACIDVEN 1.6 01/30/2023 0543     MICROBIOLOGY: Recent Results (from the past 240 hour(s))  Blood culture (routine x 2)     Status: None (Preliminary result)   Collection Time: 01/29/23  2:19 PM   Specimen: BLOOD  Result Value Ref Range Status   Specimen Description BLOOD SITE NOT SPECIFIED  Final   Special Requests   Final    BOTTLES DRAWN AEROBIC AND ANAEROBIC Blood Culture adequate volume   Culture   Final    NO GROWTH 2 DAYS Performed at [facility], [address], [place], [place] [address]    Report Status PENDING  Incomplete  Resp panel by RT-PCR (RSV, Flu A&B, Covid) Anterior Nasal Swab     Status: None   Collection Time: 01/29/23  3:05 PM   Specimen: Anterior Nasal Swab  Result Value Ref Range Status   SARS Coronavirus 2 by RT PCR NEGATIVE NEGATIVE Final   Influenza A by PCR NEGATIVE NEGATIVE Final   Influenza B by PCR NEGATIVE NEGATIVE Final    Comment: (NOTE) The Xpert Xpress SARS-CoV-2/FLU/RSV plus assay is intended as an aid in the diagnosis of influenza from Nasopharyngeal swab specimens and should not be used as a sole basis for treatment. Nasal washings and aspirates are unacceptable for Xpert Xpress SARS-CoV-2/FLU/RSV testing.  Fact Sheet for Patients: BloggerCourse.com  Fact Sheet for Healthcare Providers: SeriousBroker.it  This test is not yet approved or cleared by the [place] FDA and has been authorized for detection and/or diagnosis of SARS-CoV-2 by FDA under an Emergency Use Authorization (EUA). This EUA will remain in effect (meaning this test can be used) for the duration of the COVID-19 declaration under Section 564(b)(1) of the Act, 21 U.S.C. section 360bbb-3(b)(1), unless the authorization is terminated or revoked.     Resp Syncytial Virus by PCR NEGATIVE NEGATIVE Final    Comment: (NOTE) Fact Sheet for Patients: BloggerCourse.com  Fact Sheet for Healthcare  Providers: SeriousBroker.it  This test is not yet approved or cleared by the [place] FDA and has been authorized for detection and/or diagnosis of SARS-CoV-2 by FDA under an Emergency Use Authorization (EUA). This EUA will remain in effect (meaning this test can be used) for the duration of the COVID-19 declaration under Section 564(b)(1) of the Act, 21 U.S.C. section 360bbb-3(b)(1), unless the authorization is terminated or revoked.  Performed at [facility], [address] [place], [place] [address]   Blood culture (routine x 2)     Status: None (Preliminary result)   Collection Time: 01/30/23  4:36 AM   Specimen: BLOOD RIGHT HAND  Result Value Ref Range Status   Specimen Description BLOOD RIGHT HAND  Final   Special Requests   Final    BOTTLES DRAWN AEROBIC ONLY Blood Culture results may not be optimal due to an inadequate volume of blood received in culture bottles   Culture   Final    NO GROWTH 1 DAY Performed at [facility], [address] [place], [place] [address]    Report Status PENDING  Incomplete    RADIOLOGY STUDIES/RESULTS: DG Chest Port 1 View  Result Date: 01/31/2023 CLINICAL DATA:  [name]. EXAM: PORTABLE CHEST 1 VIEW COMPARISON:  November 23, 2022. FINDINGS: Stable cardiomediastinal silhouette. Right internal jugular dialysis catheter  is unchanged. Both lungs are clear. The visualized skeletal structures are unremarkable. IMPRESSION: No active disease. Electronically Signed   By: Lupita Raider M.D.   On: 01/31/2023 16:46     LOS: 3 days   Jeoffrey Massed, MD  [facility]    To contact the attending provider between 7A-7P or the covering provider during after hours 7P-7A, please log into the web site [email or web address] and access using universal [place] password for that web site. If you do not have the password, please call the hospital operator.  02/01/2023, 8:35 AM

## 2023-02-01 NOTE — Progress Notes (Signed)
OT Cancellation Note  Patient Details Name: Deborah [name] MRN: [id number] DOB: 01/18/1966   Cancelled Treatment:    Reason Eval/Treat Not Completed: Patient at procedure or test/ unavailable (HD)  [name] 02/01/2023, 10:34 AM

## 2023-02-01 NOTE — Progress Notes (Addendum)
[facility] Progress Note   Subjective: On HD resting quietly without complaints at present. SR rate 70s. Tolerating HD without issues. To receive 1 unit PRBCs with HD today.   Objective Vitals:   02/01/23 0805 02/01/23 0808 02/01/23 0830 02/01/23 0856  BP: 112/67 119/67 112/71 104/66  Pulse: 81 81 79 (!) 58  Resp: 16 18 19 17   Temp: 98.1 F (36.7 C)     TempSrc:      SpO2: 100% 100% 100% 100%  Weight: 126.5 kg     Height:       Physical Exam General: Chronically ill appearing female in NAD Heart: S1,S2 RRR NO M/R/G Lungs: CTAB Abdomen: Obese NABS Extremities: R BKA with ACE wrap in place. No LLE edema Dialysis Access: RIJ [facility] drsg intact  Additional Objective Labs: Basic Metabolic Panel: Recent Labs  Lab 01/29/23 2320 01/30/23 0434 01/31/23 0750 02/01/23 0433  NA  --  139 135 137  K  --  3.4* 3.4* 3.3*  CL  --  107 101 104  CO2  --  21* 27 23  GLUCOSE  --  103* 125* 171*  BUN  --  17 11 15   CREATININE 5.50* 5.91* 4.09* 4.71*  CALCIUM  --  7.9* 7.9* 7.9*  PHOS 4.4  --  3.4 4.1   Liver Function Tests: Recent Labs  Lab 01/27/23 1355 01/30/23 0434 01/31/23 0750 02/01/23 0433  AST 17 15  --   --   ALT 10 9  --   --   ALKPHOS 89 97  --   --   BILITOT 0.8 0.7  --   --   PROT 6.6 6.2*  --   --   ALBUMIN 2.0* 2.0* 1.8* 1.7*   Recent Labs  Lab 01/27/23 1355  LIPASE 21   CBC: Recent Labs  Lab 01/27/23 1355 01/29/23 1430 01/29/23 2320 01/30/23 0434 01/31/23 0750 01/31/23 2325 02/01/23 0433  WBC 15.3* 18.7* 15.7* 15.2* 14.3*  --  16.7*  NEUTROABS 12.1* 14.1*  --   --   --   --   --   HGB 7.3* 7.8* 8.4* 7.4* 6.8* 7.6* 7.7*  HCT 23.7* 25.7* 27.7* 24.1* 23.1* 24.8* 24.9*  MCV 94.4 96.3 95.8 94.9 96.3  --  94.7  PLT 403* 425* 332 398 342  --  340   Blood Culture    Component Value Date/Time   SDES BLOOD RIGHT HAND 01/30/2023 0436   SPECREQUEST  01/30/2023 0436    BOTTLES DRAWN AEROBIC ONLY Blood Culture results may not be optimal  due to an inadequate volume of blood received in culture bottles   CULT  01/30/2023 0436    NO GROWTH 2 DAYS Performed at [facility], [address], [place], [place] [address]    REPTSTATUS PENDING 01/30/2023 [phone]    Cardiac Enzymes: No results for input(s): "CKTOTAL", "CKMB", "CKMBINDEX", "TROPONINI" in the last 168 hours. CBG: Recent Labs  Lab 01/31/23 0811 01/31/23 1218 01/31/23 1605 01/31/23 1654 01/31/23 2129  GLUCAP 109* 252* 192* 165* 176*   Iron Studies: No results for input(s): "IRON", "TIBC", "TRANSFERRIN", "FERRITIN" in the last 72 hours. @lablastinr3 @ Studies/Results: DG Chest Port 1 View  Result Date: 01/31/2023 CLINICAL DATA:  [name]. EXAM: PORTABLE CHEST 1 VIEW COMPARISON:  November 23, 2022. FINDINGS: Stable cardiomediastinal silhouette. Right internal jugular dialysis catheter is unchanged. Both lungs are clear. The visualized skeletal structures are unremarkable. IMPRESSION: No active disease. Electronically Signed   By: Roque Lias  Jr M.D.   On: 01/31/2023 16:46   Medications:  anticoagulant sodium citrate     cefTRIAXone (ROCEPHIN)  IV 2 g (01/31/23 2238)   vancomycin 1,000 mg (01/30/23 1712)    sodium chloride   Intravenous Once   busPIRone  5 mg Oral TID   darbepoetin (ARANESP) injection - DIALYSIS  100 mcg Subcutaneous Q Sat-1800   ferric citrate  210 mg Oral TID WC   Gerhardt's butt cream   Topical TID   heparin  5,000 Units Subcutaneous Q8H   insulin aspart  0-6 Units Subcutaneous TID WC   metoprolol tartrate  50 mg Oral BID   midodrine  10 mg Oral TID WC   mirtazapine  15 mg Oral QHS   pantoprazole  40 mg Oral BID   polyethylene glycol  17 g Oral Daily   potassium chloride  40 mEq Oral Once   senna-docusate  2 tablet Oral QHS   sodium chloride flush  3 mL Intravenous Q12H     Dialysis Orders:  MWF NW  4h  400/800  121kg   3K/2.5Ca bath   TDC   Heparin none - last OP HD 11/18, post wt 124.6kg  - has been usually  reaching dry wt - missed 2 sessions in last 3 wks 11/20 and 11/13 - was in hospital 10/9- 11/04 here  - dry wt lowered 6 kg 2 wks ago - venofer 100mg  q hd thru 12/04 - mircera 100 mcg q 2 wks, last 10/5, due 10/19     Labs --> Na 141  K 2.8  BUN 16,  creat 6.45    wBC 18  hb 7.8   Assessment/Plan: Sepsis - presenting w/ SIRS, due to dehisced [name]. IV abx started and ortho consulted. Per pmd.  ESKD - on HD MWF. Missed HD due to illness as above. Planned for HD today, due to the holiday her schedule will be Sunday, Tuesday, Friday this week.  HTN - bp's wnl, takes metoprolol at home. Cont meds as needed.  Volume - no gross vol excess on exam. Plan UF 2.5 L w/ HD  Anemia of eskd - HGB 6.8 01/31/2023 Will transfuse 1 unit PRBCS with HD today.  Overdue for esa, will start darbe here at 100 mcg weekly. Tranfuse prn.  MBD ckd - CCa in range. Phos at goal. Continue binders.   Janashia Parco H. Aadhya Bustamante NP-C 02/01/2023, 9:31 AM  [employer] [phone]

## 2023-02-01 NOTE — Progress Notes (Signed)
PT Cancellation Note  Patient Details Name: Deborah [name] MRN: [id number] DOB: 28-Nov-1965   Cancelled Treatment:    Reason Eval/Treat Not Completed: Patient at procedure or test/unavailable (Pt off the floor at dialysis. Will follow up later if time allows)   [name] 02/01/2023, 8:16 AM

## 2023-02-01 NOTE — Plan of Care (Signed)
  Problem: Education: Goal: Knowledge of General Education information will improve Description: Including pain rating scale, medication(s)/side effects and non-pharmacologic comfort measures Outcome: Progressing   Problem: Health Behavior/Discharge Planning: Goal: Ability to manage health-related needs will improve Outcome: Progressing   Problem: Clinical Measurements: Goal: Ability to maintain clinical measurements within normal limits will improve Outcome: Progressing Goal: Will remain free from infection Outcome: Progressing Goal: Diagnostic test results will improve Outcome: Progressing Goal: Respiratory complications will improve Outcome: Progressing   

## 2023-02-02 ENCOUNTER — Encounter (HOSPITAL_COMMUNITY): Payer: Self-pay | Admitting: Family Medicine

## 2023-02-02 ENCOUNTER — Encounter (HOSPITAL_COMMUNITY)
Admission: EM | Disposition: A | Discharge status: Home Health | Payer: Self-pay | Source: Home / Self Care | Attending: Internal Medicine

## 2023-02-02 ENCOUNTER — Other Ambulatory Visit: Payer: Self-pay

## 2023-02-02 ENCOUNTER — Inpatient Hospital Stay (HOSPITAL_COMMUNITY): Payer: 59 | Admitting: Anesthesiology

## 2023-02-02 DIAGNOSIS — Z89511 Acquired absence of right leg below knee: Secondary | ICD-10-CM | POA: Diagnosis not present

## 2023-02-02 DIAGNOSIS — T148XXA Other injury of unspecified body region, initial encounter: Secondary | ICD-10-CM | POA: Diagnosis not present

## 2023-02-02 DIAGNOSIS — N186 End stage renal disease: Secondary | ICD-10-CM | POA: Diagnosis not present

## 2023-02-02 DIAGNOSIS — T8781 Dehiscence of amputation stump: Secondary | ICD-10-CM

## 2023-02-02 DIAGNOSIS — Z5189 Encounter for other specified aftercare: Secondary | ICD-10-CM | POA: Diagnosis not present

## 2023-02-02 DIAGNOSIS — T8130XA Disruption of wound, unspecified, initial encounter: Secondary | ICD-10-CM | POA: Diagnosis not present

## 2023-02-02 HISTORY — PX: STUMP REVISION: SHX6102

## 2023-02-02 LAB — RENAL FUNCTION PANEL
Albumin: 1.9 g/dL — ABNORMAL LOW (ref 3.5–5.0)
Anion gap: 11 (ref 5–15)
BUN: 11 mg/dL (ref 6–20)
CO2: 23 mmol/L (ref 22–32)
Calcium: 8.1 mg/dL — ABNORMAL LOW (ref 8.9–10.3)
Chloride: 103 mmol/L (ref 98–111)
Creatinine, Ser: 3.36 mg/dL — ABNORMAL HIGH (ref 0.44–1.00)
GFR, Estimated: 15 mL/min — ABNORMAL LOW (ref >60)
Glucose, Bld: 214 mg/dL — ABNORMAL HIGH (ref 70–99)
Phosphorus: 3.1 mg/dL (ref 2.5–4.6)
Potassium: 3.2 mmol/L — ABNORMAL LOW (ref 3.5–5.1)
Sodium: 137 mmol/L (ref 135–145)

## 2023-02-02 LAB — CBC
HCT: 29.9 % — ABNORMAL LOW (ref 36.0–46.0)
Hemoglobin: 9.2 g/dL — ABNORMAL LOW (ref 12.0–15.0)
MCH: 28.8 pg (ref 26.0–34.0)
MCHC: 30.8 g/dL (ref 30.0–36.0)
MCV: 93.7 fL (ref 80.0–100.0)
Platelets: 319 10*3/uL (ref 150–400)
RBC: 3.19 MIL/uL — ABNORMAL LOW (ref 3.87–5.11)
RDW: 15.7 % — ABNORMAL HIGH (ref 11.5–15.5)
WBC: 14.2 10*3/uL — ABNORMAL HIGH (ref 4.0–10.5)
nRBC: 0 % (ref 0.0–0.2)

## 2023-02-02 LAB — GLUCOSE, CAPILLARY
Glucose-Capillary: 114 mg/dL — ABNORMAL HIGH (ref 70–99)
Glucose-Capillary: 133 mg/dL — ABNORMAL HIGH (ref 70–99)
Glucose-Capillary: 156 mg/dL — ABNORMAL HIGH (ref 70–99)
Glucose-Capillary: 157 mg/dL — ABNORMAL HIGH (ref 70–99)
Glucose-Capillary: 376 mg/dL — ABNORMAL HIGH (ref 70–99)
Glucose-Capillary: 403 mg/dL — ABNORMAL HIGH (ref 70–99)
Glucose-Capillary: 492 mg/dL — ABNORMAL HIGH (ref 70–99)

## 2023-02-02 LAB — MRSA NEXT GEN BY PCR, NASAL: MRSA by PCR Next Gen: NOT DETECTED

## 2023-02-02 SURGERY — REVISION, AMPUTATION SITE
Anesthesia: General | Site: Knee | Laterality: Right

## 2023-02-02 MED ORDER — INSULIN ASPART 100 UNIT/ML IJ SOLN: 0.0000 [IU] | INTRAMUSCULAR | Status: DC | PRN

## 2023-02-02 MED ORDER — LACTATED RINGERS IV SOLN: INTRAVENOUS | Status: DC | PRN

## 2023-02-02 MED ORDER — MIDAZOLAM HCL 2 MG/2ML IJ SOLN: 2.0000 mg | Freq: Once | INTRAMUSCULAR | Status: AC

## 2023-02-02 MED ORDER — VASHE WOUND IRRIGATION OPTIME
TOPICAL | Status: DC | PRN
Start: 2023-02-02 — End: 2023-02-02
  Administered 2023-02-02: 34 [oz_av]

## 2023-02-02 MED ORDER — LABETALOL HCL 5 MG/ML IV SOLN: 10.0000 mg | INTRAVENOUS | Status: DC | PRN

## 2023-02-02 MED ORDER — ORAL CARE MOUTH RINSE
15.0000 mL | Freq: Once | OROMUCOSAL | Status: AC
  Administered 2023-02-02: 15 mL via OROMUCOSAL

## 2023-02-02 MED ORDER — ONDANSETRON HCL 4 MG/2ML IJ SOLN
INTRAMUSCULAR | Status: AC
  Filled 2023-02-02: qty 2

## 2023-02-02 MED ORDER — METOPROLOL TARTRATE 5 MG/5ML IV SOLN: 2.0000 mg | INTRAVENOUS | Status: DC | PRN

## 2023-02-02 MED ORDER — FENTANYL CITRATE (PF) 250 MCG/5ML IJ SOLN
INTRAMUSCULAR | Status: AC
  Filled 2023-02-02: qty 5

## 2023-02-02 MED ORDER — PROPOFOL 10 MG/ML IV BOLUS
INTRAVENOUS | Status: DC | PRN
  Administered 2023-02-02: 250 mg via INTRAVENOUS

## 2023-02-02 MED ORDER — DEXAMETHASONE SODIUM PHOSPHATE 10 MG/ML IJ SOLN
INTRAMUSCULAR | Status: AC
  Filled 2023-02-02: qty 1

## 2023-02-02 MED ORDER — LIDOCAINE 2% (20 MG/ML) 5 ML SYRINGE
INTRAMUSCULAR | Status: AC
  Filled 2023-02-02: qty 5

## 2023-02-02 MED ORDER — FENTANYL CITRATE (PF) 100 MCG/2ML IJ SOLN
INTRAMUSCULAR | Status: AC
  Administered 2023-02-02: 100 ug via INTRAVENOUS
  Filled 2023-02-02: qty 2

## 2023-02-02 MED ORDER — INSULIN ASPART 100 UNIT/ML IJ SOLN
7.0000 [IU] | Freq: Once | INTRAMUSCULAR | Status: AC
  Administered 2023-02-02: 7 [IU] via SUBCUTANEOUS

## 2023-02-02 MED ORDER — VANCOMYCIN HCL 1000 MG IV SOLR
INTRAVENOUS | Status: AC
  Filled 2023-02-02: qty 20

## 2023-02-02 MED ORDER — VANCOMYCIN HCL 1000 MG IV SOLR
INTRAVENOUS | Status: DC | PRN
Start: 2023-02-02 — End: 2023-02-02
  Administered 2023-02-02: 1000 mg via TOPICAL

## 2023-02-02 MED ORDER — CEFAZOLIN IN SODIUM CHLORIDE 3-0.9 GM/100ML-% IV SOLN
3.0000 g | INTRAVENOUS | Status: AC
  Administered 2023-02-02: 3 g via INTRAVENOUS
  Filled 2023-02-02: qty 100

## 2023-02-02 MED ORDER — SODIUM CHLORIDE 0.9 % IV SOLN: INTRAVENOUS | Status: DC

## 2023-02-02 MED ORDER — POVIDONE-IODINE 10 % EX SWAB
2.0000 | Freq: Once | CUTANEOUS | Status: AC
  Administered 2023-02-02: 2 via TOPICAL

## 2023-02-02 MED ORDER — FENTANYL CITRATE (PF) 250 MCG/5ML IJ SOLN
INTRAMUSCULAR | Status: DC | PRN
  Administered 2023-02-02: 50 ug via INTRAVENOUS

## 2023-02-02 MED ORDER — CHLORHEXIDINE GLUCONATE 4 % EX SOLN: 60.0000 mL | Freq: Once | CUTANEOUS | Status: DC

## 2023-02-02 MED ORDER — PHENOL 1.4 % MT LIQD: 1.0000 | OROMUCOSAL | Status: DC | PRN

## 2023-02-02 MED ORDER — VITAMIN C 500 MG PO TABS
1000.0000 mg | ORAL_TABLET | Freq: Every day | ORAL | Status: DC
  Administered 2023-02-02 – 2023-02-07 (×6): 1000 mg via ORAL
  Filled 2023-02-02 (×6): qty 2

## 2023-02-02 MED ORDER — HYDRALAZINE HCL 20 MG/ML IJ SOLN: 5.0000 mg | INTRAMUSCULAR | Status: DC | PRN

## 2023-02-02 MED ORDER — HYDROMORPHONE HCL 1 MG/ML IJ SOLN
0.5000 mg | INTRAMUSCULAR | Status: DC | PRN
Start: 2023-02-02 — End: 2023-02-03
  Administered 2023-02-02 – 2023-02-03 (×4): 1 mg via INTRAVENOUS
  Filled 2023-02-02 (×4): qty 1

## 2023-02-02 MED ORDER — PROPOFOL 10 MG/ML IV BOLUS
INTRAVENOUS | Status: AC
  Filled 2023-02-02: qty 20

## 2023-02-02 MED ORDER — CLONIDINE HCL (ANALGESIA) 100 MCG/ML EP SOLN
EPIDURAL | Status: DC | PRN
  Administered 2023-02-02: 100 ug

## 2023-02-02 MED ORDER — ACETAMINOPHEN 325 MG PO TABS: 325.0000 mg | ORAL_TABLET | Freq: Four times a day (QID) | ORAL | Status: DC | PRN

## 2023-02-02 MED ORDER — HYDROMORPHONE HCL 1 MG/ML IJ SOLN
1.5000 mg | Freq: Once | INTRAMUSCULAR | Status: AC
  Administered 2023-02-02: 1.5 mg via INTRAVENOUS
  Filled 2023-02-02: qty 1.5

## 2023-02-02 MED ORDER — TRANEXAMIC ACID-NACL 1000-0.7 MG/100ML-% IV SOLN
1000.0000 mg | INTRAVENOUS | Status: AC
  Administered 2023-02-02: 1000 mg via INTRAVENOUS
  Filled 2023-02-02: qty 100

## 2023-02-02 MED ORDER — PHENYLEPHRINE 80 MCG/ML (10ML) SYRINGE FOR IV PUSH (FOR BLOOD PRESSURE SUPPORT)
PREFILLED_SYRINGE | INTRAVENOUS | Status: DC | PRN
  Administered 2023-02-02: 160 ug via INTRAVENOUS
  Administered 2023-02-02: 80 ug via INTRAVENOUS
  Administered 2023-02-02: 160 ug via INTRAVENOUS

## 2023-02-02 MED ORDER — MIDAZOLAM HCL 2 MG/2ML IJ SOLN
INTRAMUSCULAR | Status: AC
  Administered 2023-02-02: 2 mg via INTRAVENOUS
  Filled 2023-02-02: qty 2

## 2023-02-02 MED ORDER — DEXAMETHASONE SODIUM PHOSPHATE 10 MG/ML IJ SOLN
INTRAMUSCULAR | Status: DC | PRN
  Administered 2023-02-02 (×2): 10 mg via INTRAVENOUS

## 2023-02-02 MED ORDER — 0.9 % SODIUM CHLORIDE (POUR BTL) OPTIME
TOPICAL | Status: DC | PRN
Start: 2023-02-02 — End: 2023-02-02
  Administered 2023-02-02: 1000 mL

## 2023-02-02 MED ORDER — ROPIVACAINE HCL 5 MG/ML IJ SOLN
INTRAMUSCULAR | Status: DC | PRN
  Administered 2023-02-02 (×5): 5 mL via PERINEURAL

## 2023-02-02 MED ORDER — CHLORHEXIDINE GLUCONATE 0.12 % MT SOLN
OROMUCOSAL | Status: AC
  Filled 2023-02-02: qty 15

## 2023-02-02 MED ORDER — CHLORHEXIDINE GLUCONATE 0.12 % MT SOLN: 15.0000 mL | Freq: Once | OROMUCOSAL | Status: DC

## 2023-02-02 MED ORDER — GUAIFENESIN-DM 100-10 MG/5ML PO SYRP: 15.0000 mL | ORAL_SOLUTION | ORAL | Status: DC | PRN

## 2023-02-02 MED ORDER — POTASSIUM CHLORIDE 20 MEQ PO PACK
40.0000 meq | PACK | Freq: Once | ORAL | Status: DC
  Filled 2023-02-02: qty 2

## 2023-02-02 MED ORDER — ZINC SULFATE 220 (50 ZN) MG PO CAPS
220.0000 mg | ORAL_CAPSULE | Freq: Every day | ORAL | Status: AC
  Administered 2023-02-02 – 2023-02-15 (×14): 220 mg via ORAL
  Filled 2023-02-02 (×14): qty 1

## 2023-02-02 MED ORDER — HYDROMORPHONE HCL 1 MG/ML IJ SOLN
3.0000 mg | INTRAMUSCULAR | Status: DC | PRN
  Filled 2023-02-02: qty 3

## 2023-02-02 MED ORDER — LIDOCAINE 2% (20 MG/ML) 5 ML SYRINGE
INTRAMUSCULAR | Status: DC | PRN
  Administered 2023-02-02: 100 mg via INTRAVENOUS

## 2023-02-02 MED ORDER — JUVEN PO PACK
1.0000 | PACK | Freq: Two times a day (BID) | ORAL | Status: DC
  Filled 2023-02-02 (×6): qty 1

## 2023-02-02 MED ORDER — FENTANYL CITRATE (PF) 100 MCG/2ML IJ SOLN: 100.0000 ug | Freq: Once | INTRAMUSCULAR | Status: AC

## 2023-02-02 MED ORDER — CEFAZOLIN SODIUM-DEXTROSE 2-4 GM/100ML-% IV SOLN: 2.0000 g | Freq: Three times a day (TID) | INTRAVENOUS | Status: DC

## 2023-02-02 MED ORDER — PROPOFOL 10 MG/ML IV BOLUS
INTRAVENOUS | Status: AC
Start: 2023-02-02 — End: ?
  Filled 2023-02-02: qty 20

## 2023-02-02 SURGICAL SUPPLY — 31 items
BAG COUNTER SPONGE SURGICOUNT (BAG) ×1 IMPLANT
BLADE SAW RECIP 87.9 MT (BLADE) IMPLANT
BLADE SURG 21 STRL SS (BLADE) ×1 IMPLANT
CANISTER WOUND CARE 500ML ATS (WOUND CARE) ×1 IMPLANT
CLEANSER WND VASHE 34 (WOUND CARE) IMPLANT
COVER SURGICAL LIGHT HANDLE (MISCELLANEOUS) ×1 IMPLANT
DRAPE DERMATAC (DRAPES) IMPLANT
DRAPE EXTREMITY T 121X128X90 (DISPOSABLE) ×1 IMPLANT
DRAPE HALF SHEET 40X57 (DRAPES) ×1 IMPLANT
DRAPE INCISE IOBAN 66X45 STRL (DRAPES) ×1 IMPLANT
DRAPE U-SHAPE 47X51 STRL (DRAPES) ×2 IMPLANT
DRESSING PREVENA PLUS CUSTOM (GAUZE/BANDAGES/DRESSINGS) ×1 IMPLANT
DRSG PREVENA PLUS CUSTOM (GAUZE/BANDAGES/DRESSINGS) ×1
DURAPREP 26ML APPLICATOR (WOUND CARE) ×1 IMPLANT
ELECT REM PT RETURN 9FT ADLT (ELECTROSURGICAL) ×1
ELECTRODE REM PT RTRN 9FT ADLT (ELECTROSURGICAL) ×1 IMPLANT
GLOVE BIOGEL PI IND STRL 9 (GLOVE) ×1 IMPLANT
GLOVE SURG ORTHO 9.0 STRL STRW (GLOVE) ×1 IMPLANT
GOWN STRL REUS W/ TWL XL LVL3 (GOWN DISPOSABLE) ×2 IMPLANT
GRAFT SKIN WND MICRO 38 (Tissue) IMPLANT
KIT BASIN OR (CUSTOM PROCEDURE TRAY) ×1 IMPLANT
KIT TURNOVER KIT B (KITS) ×1 IMPLANT
MANIFOLD NEPTUNE II (INSTRUMENTS) ×1 IMPLANT
NS IRRIG 1000ML POUR BTL (IV SOLUTION) ×1 IMPLANT
PACK GENERAL/GYN (CUSTOM PROCEDURE TRAY) ×1 IMPLANT
PAD ARMBOARD 7.5X6 YLW CONV (MISCELLANEOUS) ×1 IMPLANT
PREVENA RESTOR ARTHOFORM 46X30 (CANNISTER) ×1 IMPLANT
STAPLER VISISTAT 35W (STAPLE) IMPLANT
SUT ETHILON 2 0 PSLX (SUTURE) ×2 IMPLANT
SUT SILK 2-0 18XBRD TIE 12 (SUTURE) IMPLANT
TOWEL GREEN STERILE (TOWEL DISPOSABLE) ×1 IMPLANT

## 2023-02-02 NOTE — Plan of Care (Signed)
  Problem: Clinical Measurements: Goal: Ability to maintain clinical measurements within normal limits will improve Outcome: Progressing   Problem: Nutrition: Goal: Adequate nutrition will be maintained Outcome: Progressing   Problem: Pain Management: Goal: General experience of comfort will improve Outcome: Progressing   Problem: Metabolic: Goal: Ability to maintain appropriate glucose levels will improve Outcome: Progressing

## 2023-02-02 NOTE — Anesthesia Procedure Notes (Addendum)
Anesthesia Regional Block: Popliteal block   Pre-Anesthetic Checklist: , timeout performed,  Correct Patient, Correct Site, Correct Laterality,  Correct Procedure, Correct Position, site marked,  Risks and benefits discussed,  Surgical consent,  Pre-op evaluation,  At surgeon's request and post-op pain management  Laterality: Lower and Right  Prep: chloraprep       Needles:  Injection technique: Single-shot  Needle Type: Echogenic Stimulator Needle     Needle Length: 9cm  Needle Gauge: 20   Needle insertion depth: 2.5 cm   Additional Needles:   Procedures:,,,, ultrasound used (permanent image in chart),,    Narrative:  Start time: 02/02/2023 9:50 AM End time: 02/02/2023 10:00 AM Injection made incrementally with aspirations every 5 mL.  Performed by: Personally  Anesthesiologist: Leilani Able, MD

## 2023-02-02 NOTE — Transfer of Care (Signed)
Immediate Anesthesia Transfer of Care Note  Patient: Deborah [name]  Procedure(s) Performed: REVISION RIGHT BELOW KNEE AMPUTATION (Right: Knee)  Patient Location: PACU  Anesthesia Type:General  Level of Consciousness: awake, alert , and oriented  Airway & Oxygen Therapy: Patient Spontanous Breathing and Patient connected to face mask oxygen  Post-op Assessment: Report given to RN and Post -op Vital signs reviewed and stable  Post vital signs: Reviewed and stable  Last Vitals:  Vitals Value Taken Time  BP 100/54 02/02/23 1105  Temp    Pulse 72 02/02/23 1109  Resp 16 02/02/23 1109  SpO2 100 % 02/02/23 1109  Vitals shown include unfiled device data.  Last Pain:  Vitals:   02/02/23 1105  TempSrc:   PainSc: Asleep      Patients Stated Pain Goal: 5 (02/01/23 2152)  Complications: No notable events documented.

## 2023-02-02 NOTE — Progress Notes (Signed)
Orthopedic Tech Progress Note Patient Details:  Deborah [name] Jan 15, 1966 [id number]  Dropped off PRAFO with RN. RN was helping patient. Ortho Devices Type of Ortho Device: Prafo boot/shoe Ortho Device/Splint Interventions: Floria Raveling 02/02/2023, 7:23 PM

## 2023-02-02 NOTE — Op Note (Signed)
02/02/2023  11:04 AM  PATIENT:  Deborah [name]    PRE-OPERATIVE DIAGNOSIS:  Dehiscence Right Below Knee Amputation  POST-OPERATIVE DIAGNOSIS:  Same  PROCEDURE:  REVISION RIGHT BELOW KNEE AMPUTATION Application Kerecis micro graft for tissue reinforcement 38 cm. Application of customizable wound VAC sponge.  SURGEON:  Nadara Mustard, MD  PHYSICIAN ASSISTANT:None ANESTHESIA:   General  PREOPERATIVE INDICATIONS:  [name] is a  57 y.o. female with a diagnosis of Dehiscence Right Below Knee Amputation who failed conservative measures and elected for surgical management.    The risks benefits and alternatives were discussed with the patient preoperatively including but not limited to the risks of infection, bleeding, nerve injury, cardiopulmonary complications, the need for revision surgery, among others, and the patient was willing to proceed.  OPERATIVE IMPLANTS:   Implant Name Type Inv. Item Serial No. Manufacturer Lot No. LRB No. Used Action  GRAFT SKIN WND MICRO 38 - [id number] Tissue GRAFT SKIN WND MICRO 38  [employer] [id number] Right 1 Implanted    @ENCIMAGES @  OPERATIVE FINDINGS: Tissue margins were clear.  OPERATIVE PROCEDURE: Patient brought the operating room and underwent a general anesthetic.  After adequate levels anesthesia obtained patient's right lower extremity was prepped using DuraPrep draped into a sterile field a timeout was called.  A fishmouth incision was made over around the ulcerative tissue this was carried down through healthy viable tissue the distal 2 cm of the tibia and reefed fibula were resected with a reciprocating saw and the bone and soft tissue was resected in 1 block of tissue.  The vascular bundles were suture-ligated with 2-0 silk.  Electrocautery was used hemostasis.  The wound was irrigated with Vashe irrigation.  The tissue was then reinforced with 38 cm of Kerecis micro graft.  The fascia and skin was closed using 2-0  nylon and staples.  A Prevena customizable wound VAC was applied this had a good suction fit patient was extubated taken the PACU in stable condition.   DISCHARGE PLANNING:  Antibiotic duration: 24-hour antibiotics  Weightbearing: Nonweightbearing on the right  Pain medication: Opioid pathway  Dressing care/ Wound VAC: Wound VAC continue at discharge with the Prevena plus  Ambulatory devices: As needed per therapy  Discharge to: Anticipate discharge to skilled nursing.  Follow-up: In the office 1 week post operative.

## 2023-02-02 NOTE — Progress Notes (Signed)
TRH night cross cover note:   I was notified by RN that the patient is complaining of some right lower extremity discomfort.  She is here with wound dehiscence relating to right lower extremity BKA, and is reported to have a history of chronic pain syndrome.  The patient is reported to give received 1 mg of IV Dilaudid at 1707, with the patient reporting no significant improvement in her pain following this dose.  Per my chart review, vital signs appear stable, with most recent systolic pressures in the 140s, heart rates in the 70s, respiratory rate in the low 20s, and the patient is maintaining oxygen saturation of 100% on room air.  I subsequently placed an order for Dilaudid 1.5 mg IV x 1 dose now.     Newton Pigg, DO Hospitalist

## 2023-02-02 NOTE — Anesthesia Preprocedure Evaluation (Signed)
Anesthesia Evaluation  Patient identified by MRN, date of birth, ID band Patient awake    Reviewed: Allergy & Precautions, NPO status , Patient's Chart, lab work & pertinent test results, reviewed documented beta blocker date and time   History of Anesthesia Complications Negative for: history of anesthetic complications  Airway Mallampati: II  TM Distance: >3 FB Neck ROM: Full    Dental  (+) Missing, Dental Advisory Given   Pulmonary    Pulmonary exam normal breath sounds clear to auscultation       Cardiovascular hypertension, Pt. on medications and Pt. on home beta blockers +CHF   Rhythm:Regular Rate:Tachycardia  TTE 2024: EF  55-60%, grade 1 DD   Hypertensive with diastolic pressures over 100 consistently    Neuro/Psych  PSYCHIATRIC DISORDERS Anxiety Depression    CVA    GI/Hepatic Neg liver ROS, PUD,GERD  Medicated,,  Endo/Other  diabetes, Type 2, Insulin Dependent  Class 3 obesity  Renal/GU Renal InsufficiencyRenal disease  negative genitourinary   Musculoskeletal  (+)  Fibromyalgia -  Abdominal  (+) + obese  Peds  Hematology  (+) Blood dyscrasia (Hgb 6.4), anemia   Anesthesia Other Findings Day of surgery medications reviewed with patient.  Reproductive/Obstetrics                             Anesthesia Physical Anesthesia Plan  ASA: 3  Anesthesia Plan: General   Post-op Pain Management: Regional block*   Induction: Intravenous  PONV Risk Score and Plan: 3 and Ondansetron, Dexamethasone, Treatment may vary due to age or medical condition and Midazolam  Airway Management Planned: LMA  Additional Equipment: None  Intra-op Plan:   Post-operative Plan: Extubation in OR  Informed Consent: I have reviewed the patients History and Physical, chart, labs and discussed the procedure including the risks, benefits and alternatives for the proposed anesthesia with the patient or  authorized representative who has indicated his/her understanding and acceptance.     Dental advisory given  Plan Discussed with: CRNA  Anesthesia Plan Comments:         Anesthesia Quick Evaluation

## 2023-02-02 NOTE — Interval H&P Note (Signed)
History and Physical Interval Note:  02/02/2023 6:35 AM  Deborah [name]  has presented today for surgery, with the diagnosis of Dehiscence Right Below Knee Amputation.  The various methods of treatment have been discussed with the patient and family. After consideration of risks, benefits and other options for treatment, the patient has consented to  Procedure(s): REVISION RIGHT BELOW KNEE AMPUTATION (Right) as a surgical intervention.  The patient's history has been reviewed, patient examined, no change in status, stable for surgery.  I have reviewed the patient's chart and labs.  Questions were answered to the patient's satisfaction.     Nadara Mustard

## 2023-02-02 NOTE — Progress Notes (Signed)
TRH night cross cover note:   I was notified by RN of the pt's evening cbg of 492, which occurred after the patient was noted to partake in a late dinner.   I subsequently ordered Novolog 7 units sq x 1 dose now, along with repeat cbg around 2300 and requested to be notified if this ensuing cbg result remained greater than 400.   Update: patient's updated CBG is reported to be 409.  I subsequently ordered an additional Novolog 8 units sq x 1 dose now , with repeat cbg to occur in about one hour.     Newton Pigg, DO Hospitalist

## 2023-02-02 NOTE — Progress Notes (Signed)
[facility] Progress Note   Subjective: patient seen and examined in room.  S/p right BKA revision this AM, is experiencing post op pain. No other complaints.  Objective Vitals:   02/02/23 1105 02/02/23 1115 02/02/23 1130 02/02/23 1220  BP: (!) 100/54 119/66 123/69 116/68  Pulse: 67 71 72 71  Resp: 18 14 14 17   Temp: 98.1 F (36.7 C)  98.2 F (36.8 C)   TempSrc:    Oral  SpO2: 100% 100% 100%   Weight:      Height:       Physical Exam General: Chronically ill appearing female in NAD, laying flat in bed Heart: S1,S2 RRR NO M/R/G Lungs: CTAB Abdomen: Obese NABS Extremities: R BKA, no LLE edema Neuro: awake, alert Dialysis Access: RIJ TDC drsg intact  Additional Objective Labs: Basic Metabolic Panel: Recent Labs  Lab 01/31/23 0750 02/01/23 0433 02/02/23 0458  NA 135 137 137  K 3.4* 3.3* 3.2*  CL 101 104 103  CO2 27 23 23   GLUCOSE 125* 171* 214*  BUN 11 15 11   CREATININE 4.09* 4.71* 3.36*  CALCIUM 7.9* 7.9* 8.1*  PHOS 3.4 4.1 3.1   Liver Function Tests: Recent Labs  Lab 01/27/23 1355 01/30/23 0434 01/31/23 0750 02/01/23 0433 02/02/23 0458  AST 17 15  --   --   --   ALT 10 9  --   --   --   ALKPHOS 89 97  --   --   --   BILITOT 0.8 0.7  --   --   --   PROT 6.6 6.2*  --   --   --   ALBUMIN 2.0* 2.0* 1.8* 1.7* 1.9*   Recent Labs  Lab 01/27/23 1355  LIPASE 21   CBC: Recent Labs  Lab 01/27/23 1355 01/29/23 1430 01/29/23 2320 01/30/23 0434 01/31/23 0750 01/31/23 2325 02/01/23 0433 02/02/23 0458  WBC 15.3* 18.7* 15.7* 15.2* 14.3*  --  16.7* 14.2*  NEUTROABS 12.1* 14.1*  --   --   --   --   --   --   HGB 7.3* 7.8* 8.4* 7.4* 6.8* 7.6* 7.7* 9.2*  HCT 23.7* 25.7* 27.7* 24.1* 23.1* 24.8* 24.9* 29.9*  MCV 94.4 96.3 95.8 94.9 96.3  --  94.7 93.7  PLT 403* 425* 332 398 342  --  340 319   Blood Culture    Component Value Date/Time   SDES BLOOD RIGHT HAND 01/30/2023 0436   SPECREQUEST  01/30/2023 0436    BOTTLES DRAWN AEROBIC ONLY  Blood Culture results may not be optimal due to an inadequate volume of blood received in culture bottles   CULT  01/30/2023 0436    NO GROWTH 3 DAYS Performed at [facility], [address], [place], [place] [address]    REPTSTATUS PENDING 01/30/2023 [phone]    Cardiac Enzymes: No results for input(s): "CKTOTAL", "CKMB", "CKMBINDEX", "TROPONINI" in the last 168 hours. CBG: Recent Labs  Lab 02/01/23 2119 02/02/23 0758 02/02/23 0942 02/02/23 1111 02/02/23 1226  GLUCAP 195* 156* 133* 114* 157*   Iron Studies: No results for input(s): "IRON", "TIBC", "TRANSFERRIN", "FERRITIN" in the last 72 hours. @lablastinr3 @ Studies/Results: DG Chest Port 1 View  Result Date: 01/31/2023 CLINICAL DATA:  [name]. EXAM: PORTABLE CHEST 1 VIEW COMPARISON:  November 23, 2022. FINDINGS: Stable cardiomediastinal silhouette. Right internal jugular dialysis catheter is unchanged. Both lungs are clear. The visualized skeletal structures are unremarkable. IMPRESSION: No active disease. Electronically Signed   By: Fayrene Fearing  Christen Butter M.D.   On: 01/31/2023 16:46   Medications:  cefTRIAXone (ROCEPHIN)  IV 2 g (02/02/23 1304)   vancomycin 1,000 mg (02/01/23 1733)    sodium chloride   Intravenous Once   sodium chloride   Intravenous Once   busPIRone  5 mg Oral TID   darbepoetin (ARANESP) injection - DIALYSIS  100 mcg Subcutaneous Q Sat-1800   ferric citrate  210 mg Oral TID WC   Gerhardt's butt cream   Topical TID   heparin  5,000 Units Subcutaneous Q8H   insulin aspart  0-6 Units Subcutaneous TID WC   metoprolol tartrate  50 mg Oral BID   midodrine  10 mg Oral TID WC   mirtazapine  15 mg Oral QHS   pantoprazole  40 mg Oral BID   polyethylene glycol  17 g Oral Daily   potassium chloride  40 mEq Oral Once   potassium chloride  40 mEq Oral Once   senna-docusate  2 tablet Oral QHS   sodium chloride flush  3 mL Intravenous Q12H     Dialysis Orders:  MWF [facility]  4h  400/800  121kg   3K/2.5Ca  bath   TDC   Heparin none - last OP HD 11/18, post wt 124.6kg  - has been usually reaching dry wt - missed 2 sessions in last 3 wks 11/20 and 11/13 - was in hospital 10/9- 11/04 here  - dry wt lowered 6 kg 2 wks ago - venofer 100mg  q hd thru 12/04 - mircera 100 mcg q 2 wks, last 10/5, due 10/19     Labs --> Na 141  K 2.8  BUN 16,  creat 6.45    wBC 18  hb 7.8   Assessment/Plan: Sepsis - presenting w/ SIRS, due to dehisced [name]. IV abx started and ortho consulted. Per pmd. S/p bka revision 11/27 [facility] - on HD MWF. Missed HD due to illness as above. due to the holiday her HD schedule will be Sunday, Tuesday, Friday this week.  HTN - bp's wnl, takes metoprolol at home. Cont meds as needed.  Volume - no gross vol excess on exam. Plan UF 2.5 L w/ HD  Anemia of eskd - hgb up to 9.2Overdue for esa, started darbe here at 100 mcg weekly. Tranfuse prn.  MBD ckd - CCa in range. Phos at goal. Continue binders.    Anthony Sar, MD [facility]

## 2023-02-02 NOTE — Progress Notes (Signed)
Pt receives out-pt HD at [facility] on MWF. Will assist as needed.   Tracy Mounce Renal Navigator [phone] 

## 2023-02-02 NOTE — TOC CM/SW Note (Signed)
Transition of Care [facility]) - Inpatient Brief Assessment   Patient Details  Name: Deborah [name] MRN: [id number] Date of Birth: 1965-10-13  [facility] of [facility]) CM/SW Contact:    Mearl Latin, LCSW Phone Number: 02/02/2023, 4:05 PM   Clinical Narrative: Patient admitted from home with spouse as caregiver as she is bedbound at baseline and attends Dialysis outpatient. TOC following for discharge and home health needs.    Transition of Care Asessment: Insurance and Status: Insurance coverage has been reviewed Patient has primary care physician: Yes Home environment has been reviewed: From home Prior level of function:: Bed bound   Social Determinants of Health Reivew: SDOH reviewed no interventions necessary Readmission risk has been reviewed: Yes Transition of care needs: transition of care needs identified, TOC will continue to follow

## 2023-02-02 NOTE — Progress Notes (Signed)
Contacted by staff at [facility] to say that pt has requested to be transferred back to [facility] (pt has at [facility] due to recent snf placement). [facility] staff are reaching to [place] staff to see if/when pt may be able to transfer back to [facility]. At this time, there has been no definite acceptance back to [facility] or date determined when that may be possible. Should pt be ready for d/c before that info is confirmed, pt may have to resume at [facility] until [facility] able to accept pt back. Will assist as needed.   Olivia Canter Renal Navigator [phone]

## 2023-02-02 NOTE — Plan of Care (Signed)
  Problem: Education: Goal: Knowledge of General Education information will improve Description: Including pain rating scale, medication(s)/side effects and non-pharmacologic comfort measures Outcome: Progressing   Problem: Health Behavior/Discharge Planning: Goal: Ability to manage health-related needs will improve Outcome: Progressing   Problem: Clinical Measurements: Goal: Ability to maintain clinical measurements within normal limits will improve Outcome: Progressing Goal: Will remain free from infection Outcome: Progressing Goal: Diagnostic test results will improve Outcome: Progressing Goal: Respiratory complications will improve Outcome: Progressing   

## 2023-02-02 NOTE — Progress Notes (Signed)
Pharmacy Antibiotic Note  Deborah [name] is a 57 y.o. female admitted on 01/29/2023 with   infected right stump/wound dehiscence .  Pharmacy has been consulted for cefepime and vancomycin dosing.  Plan: Vancomycin 1000 mg IV after each full hemodialysis session. Ceftriaxone per MD Monitor clinical progress, cultures/sensitivities, renal function, abx plan Vancomycin levels as indicated   Height: 5\' 6"  (167.6 cm) Weight: 113.4 kg (250 lb) IBW/kg (Calculated) : 59.3  Temp (24hrs), Avg:98 F (36.7 C), Min:97.4 F (36.3 C), Max:98.7 F (37.1 C)  Recent Labs  Lab 01/29/23 1450 01/29/23 2320 01/30/23 0434 01/30/23 0543 01/31/23 0750 02/01/23 0433 02/02/23 0458  WBC  --  15.7* 15.2*  --  14.3* 16.7* 14.2*  CREATININE  --  5.50* 5.91*  --  4.09* 4.71* 3.36*  LATICACIDVEN 2.0*  --   --  1.6  --   --   --     Estimated Creatinine Clearance: 23.3 mL/min (A) (by C-G formula based on SCr of 3.36 mg/dL (H)).    Allergies  Allergen Reactions   Isovue [Iopamidol] Anaphylaxis, [name] and Other (See Comments)    11/28/17 Patient had seizure like activity and then 1 min code after 100 cc of isovue 300. Possible contrast allergy vs vasovagal episode  Cardiac Arrest   Neurontin [Gabapentin] [name] and Swelling   Nsaids Anaphylaxis and Other (See Comments)    Hx of stomach ulcers   Penicillins Itching, Palpitations and Other (See Comments)    Flushing (Red Skin) Laryngeal Edema   Reglan [Metoclopramide] Other (See Comments)    Tardive dyskinesia    Valium [Diazepam] [name]   Zestril [Lisinopril] Anaphylaxis and Swelling    Tongue and mouth swelling Laryngeal Edema   Tolectin [Tolmetin] Nausea And Vomiting, Nausea Only and Other (See Comments)    Irritates stomach ulcer   Asa [Aspirin] Other (See Comments)    Hx of stomach ulcer   Aspartame And Phenylalanine Hives   Bentyl [Dicyclomine] Other (See Comments)    Chest pain   Hibiclens  [Chlorhexidine Gluconate] Other (See Comments)    Dermatitis    Flexeril [Cyclobenzaprine] Palpitations   Oxycontin [Oxycodone] Palpitations   Rifamycins Palpitations   Tylenol [Acetaminophen] Nausea And Vomiting, Nausea Only and Other (See Comments)    Irritates stomach ulcer Abdominal pain   Ultram [Tramadol] Nausea And Vomiting and Palpitations    Antimicrobials this admission: 11/23 Flagyl x 1 11/23 cefepime >> 11/24 11/23 vanc >>  11/25 ceftriaxone >>   Dose adjustments this admission:  Microbiology results: 11/23 BCx: ng x 4d 11/24 BCx: ng x 3d 11/23 RVP: negative  11/26 MRSA PCR: not detected   Thank you for allowing pharmacy to be a part of this patient's care.   Signe Colt, PharmD 02/02/2023 1:52 PM  **Pharmacist phone directory can be found on [email or web address] listed under [facility]**

## 2023-02-02 NOTE — Progress Notes (Signed)
PROGRESS NOTE        PATIENT DETAILS Name: Deborah [name] Age: 57 y.o. Sex: female Date of Birth: [birth year] Admit Date: 01/29/2023 Admitting Physician Meredeth Ide, MD PCP:Pcp, No  Brief Summary: Patient is a 57 y.o.  female with history of ESRD on HD MWF, PAF-not on any anticoagulation due to recent GI bleeding, HFpEF, DM-2, HTN, chronic pain syndrome on narcotics-who underwent BKA 10/23-presented with a right BKA stump dehiscence.  Significant events: 11/23>> admit to [facility].  Significant studies: None  Significant microbiology data: 11/23>> COVID/influenza/RSV PCR: Negative 11/24>> blood culture: No growth  Procedures: None  Consults: Orthopedics  Subjective: No complaints-no nausea/vomiting-lying comfortably in bed.  Objective: Vitals: Blood pressure 115/61, pulse 80, temperature (!) 97.4 F (36.3 C), temperature source Oral, resp. rate 17, height 5\' 6"  (1.676 m), weight 113.4 kg, last menstrual period 10/10/2012, SpO2 100%.   Exam: Gen Exam:Alert awake-not in any distress HEENT:atraumatic, normocephalic Chest: B/L clear to auscultation anteriorly CVS:S1S2 regular Abdomen:soft non tender, non distended Extremities: Right BKA stump-in dressing Neurology: Non focal Skin: no rash  Pertinent Labs/Radiology:    Latest Ref Rng & Units 02/02/2023    4:58 AM 02/01/2023    4:33 AM 01/31/2023   11:25 PM  CBC  WBC 4.0 - 10.5 K/uL 14.2  16.7    Hemoglobin 12.0 - 15.0 g/dL 9.2  7.7  7.6   Hematocrit 36.0 - 46.0 % 29.9  24.9  24.8   Platelets 150 - 400 K/uL 319  340      Lab Results  Component Value Date   NA 137 02/02/2023   K 3.2 (L) 02/02/2023   CL 103 02/02/2023   CO2 23 02/02/2023      Assessment/Plan: Sepsis due to right BKA stump infection with wound dehiscence (recent right calcaneal osteomyelitis requiring BKA 10/23) Sepsis physiology has resolved-mild leukocytosis today Culture negative so far Continue IV  antibiotics BKA revision scheduled for today.  Await the recommendations from orthopedics.    ESRD on HD MWF Missed HD Wednesday/Friday Nephrology following and directing care  Hypokalemia Replete/recheck  Generalized body tremors/jerking movement Occurred 11/25-no recurrence since then. Thought to be functional-resolved-neurology briefly curb sided at bedside.   As needed IV Ativan  Although has history of tardive dyskinesia-this was not felt to be tardive dyskinesia.    Normocytic anemia Secondary to ESRD-recent history of GI bleeding Hb stable-did require 1 unit of PRBC on 11/25.   Follow CBC  History of GI bleeding October 2024-thought to be due to AVMs noted on EGD PPI Follow CBC No melena/hematochezia noted.  History of NASH Supportive care  PAF Telemetry monitoring No longer on anticoagulation due to recent GI bleeding/severity of anemia Metoprolol  Chronic HFpEF Volume status stable Volume removal with HD  DM-2 (A1c 8.1 on 4/13) CBG stable-SSI  Recent Labs    02/01/23 2119 02/02/23 0758 02/02/23 0942  GLUCAP 195* 156* 133*     Chronic pain syndrome Continue narcotics-on Dilaudid at home-follows with pain management in the outpatient setting.  History of malignant duodenal carcinoid-s/p resection Stable for continued outpatient follow-up with oncology/PCP  History of tardive dyskinesia Supportive care  Anxiety/mood disorder Resume Remeron/BuSpar As needed Atarax  Debility/deconditioning PT/OT eval  Morbid Obesity: Estimated body mass index is 40.35 kg/m as calculated from the following:   Height as of this encounter: 5\' 6"  (1.676 m).  Weight as of this encounter: 113.4 kg.   Code status:   Code Status: Full Code   DVT Prophylaxis: heparin injection 5,000 Units Start: 01/29/23 2200   Family Communication: Significant other at bedside on 11/25-none at bedside this morning.  Disposition Plan: Status is: Inpatient Remains inpatient  appropriate because: Severity of illness   Planned Discharge Destination:Home   Diet: Diet Order             Diet NPO time specified  Diet effective ____                     Antimicrobial agents: Anti-infectives (From admission, onward)    Start     Dose/Rate Route Frequency Ordered Stop   02/02/23 1038  vancomycin (VANCOCIN) powder          As needed 02/02/23 1038     02/02/23 0930  ceFAZolin (ANCEF) IVPB 3g/100 mL premix        3 g 200 mL/hr over 30 Minutes Intravenous On call to O.R. 02/02/23 0923 02/03/23 0559   01/31/23 1700  [MAR Hold]  cefTRIAXone (ROCEPHIN) 2 g in sodium chloride 0.9 % 100 mL IVPB        (MAR Hold since Wed 02/02/2023 at 0850.Hold Reason: Transfer to a Procedural area)   2 g 200 mL/hr over 30 Minutes Intravenous Daily 01/31/23 1644     01/30/23 1800  ceFEPIme (MAXIPIME) 1 g in sodium chloride 0.9 % 100 mL IVPB  Status:  Discontinued        1 g 200 mL/hr over 30 Minutes Intravenous Every 24 hours 01/29/23 1705 01/31/23 1644   01/30/23 1700  [MAR Hold]  vancomycin (VANCOCIN) IVPB 1000 mg/200 mL premix        (MAR Hold since Wed 02/02/2023 at 0850.Hold Reason: Transfer to a Procedural area)   1,000 mg 200 mL/hr over 60 Minutes Intravenous Once per day on Sunday Tuesday Friday 01/30/23 1126     01/29/23 1945  vancomycin (VANCOCIN) IVPB 1000 mg/200 mL premix        1,000 mg 200 mL/hr over 60 Minutes Intravenous Every 1 hr x 2 01/29/23 1846 01/29/23 2205   01/29/23 1930  vancomycin (VANCOREADY) IVPB 2000 mg/400 mL  Status:  Discontinued        2,000 mg 200 mL/hr over 120 Minutes Intravenous  Once 01/29/23 1840 01/29/23 1846   01/29/23 1630  vancomycin (VANCOCIN) IVPB 1000 mg/200 mL premix  Status:  Discontinued       Placed in "Followed by" Linked Group   1,000 mg 200 mL/hr over 60 Minutes Intravenous  Once 01/29/23 1512 01/29/23 1944   01/29/23 1515  ceFEPIme (MAXIPIME) 1 g in sodium chloride 0.9 % 100 mL IVPB        1 g 200 mL/hr over 30 Minutes  Intravenous  Once 01/29/23 1512 01/29/23 1839   01/29/23 1515  metroNIDAZOLE (FLAGYL) IVPB 500 mg        50 0 mg 100 mL/hr over 60 Minutes Intravenous  Once 01/29/23 1512 01/29/23 1736   01/29/23 1515  vancomycin (VANCOCIN) IVPB 1000 mg/200 mL premix  Status:  Discontinued       Placed in "Followed by" Linked Group   1,000 mg 200 mL/hr over 60 Minutes Intravenous  Once 01/29/23 1512 01/29/23 1945        MEDICATIONS: Scheduled Meds:  [MAR Hold] sodium chloride   Intravenous Once   [MAR Hold] sodium chloride   Intravenous Once   [MAR Hold] busPIRone  5 mg Oral TID   [MAR Hold] darbepoetin (ARANESP) injection - DIALYSIS  100 mcg Subcutaneous Q Sat-1800   [MAR Hold] ferric citrate  210 mg Oral TID WC   [MAR Hold] Gerhardt's butt cream   Topical TID   [MAR Hold] heparin  5,000 Units Subcutaneous Q8H   [MAR Hold] insulin aspart  0-6 Units Subcutaneous TID WC   [MAR Hold] metoprolol tartrate  50 mg Oral BID   [MAR Hold] midodrine  10 mg Oral TID WC   [MAR Hold] mirtazapine  15 mg Oral QHS   [MAR Hold] pantoprazole  40 mg Oral BID   [MAR Hold] polyethylene glycol  17 g Oral Daily   [MAR Hold] potassium chloride  40 mEq Oral Once   [MAR Hold] senna-docusate  2 tablet Oral QHS   [MAR Hold] sodium chloride flush  3 mL Intravenous Q12H   Continuous Infusions:  sodium chloride      ceFAZolin (ANCEF) IV     [MAR Hold] cefTRIAXone (ROCEPHIN)  IV 2 g (02/01/23 1317)   tranexamic acid     [MAR Hold] vancomycin 1,000 mg (02/01/23 1733)   PRN Meds:.0.9 % irrigation (POUR BTL), [MAR Hold] acetaminophen **OR** [MAR Hold] acetaminophen, [MAR Hold] bisacodyl, [MAR Hold]  HYDROmorphone (DILAUDID) injection, [MAR Hold] hydrOXYzine, insulin aspart, [MAR Hold] loperamide, [MAR Hold] LORazepam, [MAR Hold] methocarbamol, [MAR Hold] ondansetron **OR** [MAR Hold] ondansetron (ZOFRAN) IV, [MAR Hold] sodium chloride flush, vancomycin, Vashe Wound Irrigation Optime   I have personally reviewed following labs  and imaging studies  LABORATORY DATA: CBC: Recent Labs  Lab 01/27/23 1355 01/29/23 1430 01/29/23 2320 01/30/23 0434 01/31/23 0750 01/31/23 2325 02/01/23 0433 02/02/23 0458  WBC 15.3* 18.7* 15.7* 15.2* 14.3*  --  16.7* 14.2*  NEUTROABS 12.1* 14.1*  --   --   --   --   --   --   HGB 7.3* 7.8* 8.4* 7.4* 6.8* 7.6* 7.7* 9.2*  HCT 23.7* 25.7* 27.7* 24.1* 23.1* 24.8* 24.9* 29.9*  MCV 94.4 96.3 95.8 94.9 96.3  --  94.7 93.7  PLT 403* 425* 332 398 342  --  340 319    Basic Metabolic Panel: Recent Labs  Lab 01/29/23 1430 01/29/23 2320 01/30/23 0434 01/31/23 0750 02/01/23 0433 02/02/23 0458  NA 141  --  139 135 137 137  K 2.8*  --  3.4* 3.4* 3.3* 3.2*  CL 107  --  107 101 104 103  CO2 23  --  21* 27 23 23   GLUCOSE 138*  --  103* 125* 171* 214*  BUN 16  --  17 11 15 11   CREATININE 6.45* 5.50* 5.91* 4.09* 4.71* 3.36*  CALCIUM 8.2*  --  7.9* 7.9* 7.9* 8.1*  PHOS  --  4.4  --  3.4 4.1 3.1    GFR: Estimated Creatinine Clearance: 23.3 mL/min (A) (by C-G formula based on SCr of 3.36 mg/dL (H)).  Liver Function Tests: Recent Labs  Lab 01/27/23 1355 01/30/23 0434 01/31/23 0750 02/01/23 0433 02/02/23 0458  AST 17 15  --   --   --   ALT 10 9  --   --   --   ALKPHOS 89 97  --   --   --   BILITOT 0.8 0.7  --   --   --   PROT 6.6 6.2*  --   --   --   ALBUMIN 2.0* 2.0* 1.8* 1.7* 1.9*   Recent Labs  Lab 01/27/23 1355  LIPASE 21  No results for input(s): "AMMONIA" in the last 168 hours.  Coagulation Profile: No results for input(s): "INR", "PROTIME" in the last 168 hours.  Cardiac Enzymes: No results for input(s): "CKTOTAL", "CKMB", "CKMBINDEX", "TROPONINI" in the last 168 hours.  BNP (last 3 results) No results for input(s): "PROBNP" in the last 8760 hours.  Lipid Profile: No results for input(s): "CHOL", "HDL", "LDLCALC", "TRIG", "CHOLHDL", "LDLDIRECT" in the last 72 hours.  Thyroid Function Tests: No results for input(s): "TSH", "T4TOTAL", "FREET4", "T3FREE",  "THYROIDAB" in the last 72 hours.  Anemia Panel: No results for input(s): "VITAMINB12", "FOLATE", "FERRITIN", "TIBC", "IRON", "RETICCTPCT" in the last 72 hours.  Urine analysis:    Component Value Date/Time   COLORURINE STRAW (A) 06/19/2022 0638   APPEARANCEUR HAZY (A) 06/19/2022 0638   LABSPEC 1.010 06/19/2022 0638   PHURINE 5.0 06/19/2022 0638   GLUCOSEU NEGATIVE 06/19/2022 0638   HGBUR SMALL (A) 06/19/2022 0638   BILIRUBINUR NEGATIVE 06/19/2022 0638   KETONESUR NEGATIVE 06/19/2022 0638   PROTEINUR 30 (A) 06/19/2022 0638   UROBILINOGEN 0.2 10/02/2013 2108   NITRITE NEGATIVE 06/19/2022 0638   LEUKOCYTESUR LARGE (A) 06/19/2022 0638    Sepsis Labs: Lactic Acid, Venous    Component Value Date/Time   LATICACIDVEN 1.6 01/30/2023 0543    MICROBIOLOGY: Recent Results (from the past 240 hour(s))  Blood culture (routine x 2)     Status: None (Preliminary result)   Collection Time: 01/29/23  2:19 PM   Specimen: BLOOD  Result Value Ref Range Status   Specimen Description BLOOD SITE NOT SPECIFIED  Final   Special Requests   Final    BOTTLES DRAWN AEROBIC AND ANAEROBIC Blood Culture adequate volume   Culture   Final    NO GROWTH 4 DAYS Performed at [facility], [address], [place], [place] [address]    Report Status PENDING  Incomplete  Resp panel by RT-PCR (RSV, Flu A&B, Covid) Anterior Nasal Swab     Status: None   Collection Time: 01/29/23  3:05 PM   Specimen: Anterior Nasal Swab  Result Value Ref Range Status   SARS Coronavirus 2 by RT PCR NEGATIVE NEGATIVE Final   Influenza A by PCR NEGATIVE NEGATIVE Final   Influenza B by PCR NEGATIVE NEGATIVE Final    Comment: (NOTE) The Xpert Xpress SARS-CoV-2/FLU/RSV plus assay is intended as an aid in the diagnosis of influenza from Nasopharyngeal swab specimens and should not be used as a sole basis for treatment. Nasal washings and aspirates are unacceptable for Xpert Xpress SARS-CoV-2/FLU/RSV testing.  Fact Sheet  for Patients: BloggerCourse.com  Fact Sheet for Healthcare Providers: SeriousBroker.it  This test is not yet approved or cleared by the [place] FDA and has been authorized for detection and/or diagnosis of SARS-CoV-2 by FDA under an Emergency Use Authorization (EUA). This EUA will remain in effect (meaning this test can be used) for the duration of the COVID-19 declaration under Section 564(b)(1) of the Act, 21 U.S.C. section 360bbb-3(b)(1), unless the authorization is terminated or revoked.     Resp Syncytial Virus by PCR NEGATIVE NEGATIVE Final    Comment: (NOTE) Fact Sheet for Patients: BloggerCourse.com  Fact Sheet for Healthcare Providers: SeriousBroker.it  This test is not yet approved or cleared by the [place] FDA and has been authorized for detection and/or diagnosis of SARS-CoV-2 by FDA under an Emergency Use Authorization (EUA). This EUA will remain in effect (meaning this test can be used) for the duration of the COVID-19 declaration under Section  564(b)(1) of the Act, 21 U.S.C. section 360bbb-3(b)(1), unless the authorization is terminated or revoked.  Performed at [facility], [address], [place], [place] [address]   Blood culture (routine x 2)     Status: None (Preliminary result)   Collection Time: 01/30/23  4:36 AM   Specimen: BLOOD RIGHT HAND  Result Value Ref Range Status   Specimen Description BLOOD RIGHT HAND  Final   Special Requests   Final    BOTTLES DRAWN AEROBIC ONLY Blood Culture results may not be optimal due to an inadequate volume of blood received in culture bottles   Culture   Final    NO GROWTH 3 DAYS Performed at [facility], [address] [place], [place] [address]    Report Status PENDING  Incomplete  MRSA Next Gen by PCR, Nasal     Status: None   Collection Time: 02/01/23 10:57 PM   Specimen: Nasal Mucosa;  Nasal Swab  Result Value Ref Range Status   MRSA by PCR Next Gen NOT DETECTED NOT DETECTED Final    Comment: (NOTE) The GeneXpert MRSA Assay (FDA approved for NASAL specimens only), is one component of a comprehensive MRSA colonization surveillance program. It is not intended to diagnose MRSA infection nor to guide or monitor treatment for MRSA infections. Test performance is not FDA approved in patients less than 42 years old. Performed at [facility], [address], [place], [place] [address]     RADIOLOGY STUDIES/RESULTS: DG Chest Port 1 View  Result Date: 01/31/2023 CLINICAL DATA:  [name]. EXAM: PORTABLE CHEST 1 VIEW COMPARISON:  November 23, 2022. FINDINGS: Stable cardiomediastinal silhouette. Right internal jugular dialysis catheter is unchanged. Both lungs are clear. The visualized skeletal structures are unremarkable. IMPRESSION: No active disease. Electronically Signed   By: Lupita Raider M.D.   On: 01/31/2023 16:46     LOS: 4 days   Jeoffrey Massed, MD  [facility]    To contact the attending provider between 7A-7P or the covering provider during after hours 7P-7A, please log into the web site [email or web address] and access using universal [place] password for that web site. If you do not have the password, please call the hospital operator.  02/02/2023, 10:42 AM

## 2023-02-02 NOTE — Anesthesia Procedure Notes (Signed)
Procedure Name: LMA Insertion Date/Time: 02/02/2023 10:21 AM  Performed by: Allyn Kenner, CRNAPre-anesthesia Checklist: Patient identified, Emergency Drugs available, Suction available and Patient being monitored Patient Re-evaluated:Patient Re-evaluated prior to induction Oxygen Delivery Method: Circle System Utilized Preoxygenation: Pre-oxygenation with 100% oxygen Induction Type: IV induction Ventilation: Mask ventilation without difficulty LMA: LMA inserted LMA Size: 4.0 Number of attempts: 1 Airway Equipment and Method: Bite block Placement Confirmation: positive ETCO2 Tube secured with: Tape Dental Injury: Teeth and Oropharynx as per pre-operative assessment

## 2023-02-03 DIAGNOSIS — T8130XA Disruption of wound, unspecified, initial encounter: Secondary | ICD-10-CM | POA: Diagnosis not present

## 2023-02-03 DIAGNOSIS — T148XXA Other injury of unspecified body region, initial encounter: Secondary | ICD-10-CM | POA: Diagnosis not present

## 2023-02-03 DIAGNOSIS — Z5189 Encounter for other specified aftercare: Secondary | ICD-10-CM | POA: Diagnosis not present

## 2023-02-03 DIAGNOSIS — N186 End stage renal disease: Secondary | ICD-10-CM | POA: Diagnosis not present

## 2023-02-03 LAB — TYPE AND SCREEN
ABO/RH(D): O POS
Antibody Screen: NEGATIVE
Donor AG Type: NEGATIVE
Donor AG Type: NEGATIVE
Unit division: 0
Unit division: 0

## 2023-02-03 LAB — BPAM RBC
Blood Product Expiration Date: 202412232359
Blood Product Expiration Date: 202412222359
ISSUE DATE / TIME: 202411251558
ISSUE DATE / TIME: 202411260958
Unit Type and Rh: 202412232359
Unit Type and Rh: 5100
Unit Type and Rh: 5100

## 2023-02-03 LAB — CBC
HCT: 29.3 % — ABNORMAL LOW (ref 36.0–46.0)
Hemoglobin: 9.3 g/dL — ABNORMAL LOW (ref 12.0–15.0)
MCH: 29.5 pg (ref 26.0–34.0)
MCHC: 31.7 g/dL (ref 30.0–36.0)
MCV: 93 fL (ref 80.0–100.0)
Platelets: 343 10*3/uL (ref 150–400)
RBC: 3.15 MIL/uL — ABNORMAL LOW (ref 3.87–5.11)
RDW: 15.2 % (ref 11.5–15.5)
WBC: 14.8 10*3/uL — ABNORMAL HIGH (ref 4.0–10.5)
nRBC: 0 % (ref 0.0–0.2)

## 2023-02-03 LAB — RENAL FUNCTION PANEL
Albumin: 1.9 g/dL — ABNORMAL LOW (ref 3.5–5.0)
Anion gap: 6 (ref 5–15)
BUN: 19 mg/dL (ref 6–20)
CO2: 23 mmol/L (ref 22–32)
Calcium: 8.3 mg/dL — ABNORMAL LOW (ref 8.9–10.3)
Chloride: 105 mmol/L (ref 98–111)
Creatinine, Ser: 3.92 mg/dL — ABNORMAL HIGH (ref 0.44–1.00)
GFR, Estimated: 13 mL/min — ABNORMAL LOW (ref >60)
Glucose, Bld: 358 mg/dL — ABNORMAL HIGH (ref 70–99)
Phosphorus: 3.2 mg/dL (ref 2.5–4.6)
Potassium: 3.3 mmol/L — ABNORMAL LOW (ref 3.5–5.1)
Sodium: 134 mmol/L — ABNORMAL LOW (ref 135–145)

## 2023-02-03 LAB — CULTURE, BLOOD (ROUTINE X 2)
Culture: NO GROWTH
Special Requests: ADEQUATE

## 2023-02-03 LAB — GLUCOSE, CAPILLARY
Glucose-Capillary: 398 mg/dL — ABNORMAL HIGH (ref 70–99)
Glucose-Capillary: 236 mg/dL — ABNORMAL HIGH (ref 70–99)
Glucose-Capillary: 208 mg/dL — ABNORMAL HIGH (ref 70–99)
Glucose-Capillary: 190 mg/dL — ABNORMAL HIGH (ref 70–99)

## 2023-02-03 MED ORDER — INSULIN ASPART 100 UNIT/ML IJ SOLN
8.0000 [IU] | Freq: Once | INTRAMUSCULAR | Status: AC
  Administered 2023-02-03: 8 [IU] via SUBCUTANEOUS

## 2023-02-03 MED ORDER — HYDROMORPHONE HCL 1 MG/ML IJ SOLN
2.0000 mg | INTRAMUSCULAR | Status: DC | PRN
  Administered 2023-02-03 – 2023-02-07 (×19): 2 mg via INTRAVENOUS
  Filled 2023-02-03 (×20): qty 2

## 2023-02-03 MED ORDER — POTASSIUM CHLORIDE CRYS ER 20 MEQ PO TBCR
40.0000 meq | EXTENDED_RELEASE_TABLET | Freq: Once | ORAL | Status: AC
  Administered 2023-02-03: 40 meq via ORAL
  Filled 2023-02-03: qty 2

## 2023-02-03 MED ORDER — POLYETHYLENE GLYCOL 3350 17 G PO PACK
17.0000 g | PACK | Freq: Two times a day (BID) | ORAL | Status: DC
  Filled 2023-02-03 (×5): qty 1

## 2023-02-03 NOTE — Progress Notes (Signed)
[facility] Progress Note   Subjective: patient seen and examined in room.  She reports that she had a lot of pain at her BKA site, pain meds sorted out and is now more tolerable. She is fearful of being discharged soon given transportation issues.   Objective Vitals:   02/02/23 2147 02/02/23 2339 02/03/23 0421 02/03/23 0800  BP: (!) 156/82 (!) 155/84 (!) 156/91 (!) 109/91  Pulse: 82 77 70 75  Resp:  19 16 16   Temp:  97.9 F (36.6 C) 98.1 F (36.7 C) 98.4 F (36.9 C)  TempSrc:  Oral Oral Oral  SpO2:  100% 100%   Weight:      Height:       Physical Exam General: Chronically ill appearing female in NAD, laying flat in bed Heart: S1,S2 RRR NO M/R/G Lungs: CTAB Abdomen: Obese NABS Extremities: R BKA, no LLE edema Neuro: awake, alert Dialysis Access: RIJ TDC drsg intact  Additional Objective Labs: Basic Metabolic Panel: Recent Labs  Lab 02/01/23 0433 02/02/23 0458 02/03/23 0331  NA 137 137 134*  K 3.3* 3.2* 3.3*  CL 104 103 105  CO2 23 23 23   GLUCOSE 171* 214* 358*  BUN 15 11 19   CREATININE 4.71* 3.36* 3.92*  CALCIUM 7.9* 8.1* 8.3*  PHOS 4.1 3.1 3.2   Liver Function Tests: Recent Labs  Lab 01/27/23 1355 01/30/23 0434 01/31/23 0750 02/01/23 0433 02/02/23 0458 02/03/23 0331  AST 17 15  --   --   --   --   ALT 10 9  --   --   --   --   ALKPHOS 89 97  --   --   --   --   BILITOT 0.8 0.7  --   --   --   --   PROT 6.6 6.2*  --   --   --   --   ALBUMIN 2.0* 2.0*   < > 1.7* 1.9* 1.9*   < > = values in this interval not displayed.   Recent Labs  Lab 01/27/23 1355  LIPASE 21   CBC: Recent Labs  Lab 01/27/23 1355 01/29/23 1430 01/29/23 2320 01/30/23 0434 01/31/23 0750 01/31/23 2325 02/01/23 0433 02/02/23 0458 02/03/23 0331  WBC 15.3* 18.7*   < > 15.2* 14.3*  --  16.7* 14.2* 14.8*  NEUTROABS 12.1* 14.1*  --   --   --   --   --   --   --   HGB 7.3* 7.8*   < > 7.4* 6.8*   < > 7.7* 9.2* 9.3*  HCT 23.7* 25.7*   < > 24.1* 23.1*   < >  24.9* 29.9* 29.3*  MCV 94.4 96.3   < > 94.9 96.3  --  94.7 93.7 93.0  PLT 403* 425*   < > 398 342  --  340 319 343   < > = values in this interval not displayed.   Blood Culture    Component Value Date/Time   SDES BLOOD RIGHT HAND 01/30/2023 0436   SPECREQUEST  01/30/2023 0436    BOTTLES DRAWN AEROBIC ONLY Blood Culture results may not be optimal due to an inadequate volume of blood received in culture bottles   CULT  01/30/2023 0436    NO GROWTH 4 DAYS Performed at [facility], [address] [place], [place] [address]    REPTSTATUS PENDING 01/30/2023 [phone]    Cardiac Enzymes: No results for input(s): "CKTOTAL", "CKMB", "CKMBINDEX", "TROPONINI" in the last  168 hours. CBG: Recent Labs  Lab 02/02/23 1657 02/02/23 2126 02/02/23 2337 02/03/23 0204 02/03/23 0758  GLUCAP 376* 492* 403* 398* 236*   Iron Studies: No results for input(s): "IRON", "TIBC", "TRANSFERRIN", "FERRITIN" in the last 72 hours. @lablastinr3 @ Studies/Results: No results found. Medications:  cefTRIAXone (ROCEPHIN)  IV 2 g (02/02/23 1304)   vancomycin 1,000 mg (02/01/23 1136)    sodium chloride   Intravenous Once   sodium chloride   Intravenous Once   vitamin C  1,000 mg Oral Daily   busPIRone  5 mg Oral TID   darbepoetin (ARANESP) injection - DIALYSIS  100 mcg Subcutaneous Q Sat-1800   ferric citrate  210 mg Oral TID WC   Gerhardt's butt cream   Topical TID   heparin  5,000 Units Subcutaneous Q8H   insulin aspart  0-6 Units Subcutaneous TID WC   metoprolol tartrate  50 mg Oral BID   midodrine  10 mg Oral TID WC   mirtazapine  15 mg Oral QHS   nutrition supplement (JUVEN)  1 packet Oral BID BM   pantoprazole  40 mg Oral BID   polyethylene glycol  17 g Oral BID   potassium chloride  40 mEq Oral Once   senna-docusate  2 tablet Oral QHS   sodium chloride flush  3 mL Intravenous Q12H   zinc sulfate (50mg  elemental zinc)  220 mg Oral Daily     Dialysis Orders:  MWF [facility]  4h  400/800  121kg    3K/2.5Ca bath   TDC   Heparin none - last OP HD 11/18, post wt 124.6kg  - has been usually reaching dry wt - missed 2 sessions in last 3 wks 11/20 and 11/13 - was in hospital 10/9- 11/04 here  - dry wt lowered 6 kg 2 wks ago - venofer 100mg  q hd thru 12/04 - mircera 100 mcg q 2 wks, last 10/5, due 10/19    Assessment/Plan: Sepsis - presenting w/ SIRS, due to dehisced [name]. IV abx started and ortho consulted. Per pmd. S/p bka revision 11/27 ESKD - on HD MWF. Missed HD due to illness as above. S/p holiday sched, next HD tomorrow HTN - bp's stable/acceptable, takes metoprolol at home. Cont meds as needed.  Volume - no gross vol excess on exam. Anemia of eskd - hgb up to 9.3. Overdue for esa, started darbe here at 100 mcg weekly. Tranfuse prn.  MBD ckd - CCa in range. Phos at goal. Continue binders.  DM2- per primary   Anthony Sar, MD [facility]

## 2023-02-03 NOTE — Progress Notes (Addendum)
Patient ID: Deborah [name], female   DOB: 1966/01/20, 57 y.o.   MRN: [id number] Patient is postoperative day 1 revision right below-knee amputation.  There is no drainage in the wound VAC canister.  Patient request increased doses of Dilaudid IV.  Recommend that she not go above 2 mg.  Discussed risk of respiratory depression.  I have written orders for her to have Vashe dressing changes daily to the left heel.  She needs to wear the PRAFO 24 hours a day left foot.  PRAFO Is currently sitting at the end of the bed.

## 2023-02-03 NOTE — Evaluation (Signed)
Physical Therapy Brief Evaluation and Discharge Note Patient Details Name: Deborah [name] MRN: [id number] DOB: July 05, 1965 Today's Date: 02/03/2023   History of Present Illness  Patient is a 57 y.o.  female with history of ESRD on HD MWF, PAF-not on any anticoagulation due to recent GI bleeding, HFpEF, DM-2, HTN, chronic pain syndrome on narcotics-who underwent BKA 10/23-presented with a right BKA stump dehiscence. Pt is now s/p 11/27 R AKA.  Clinical Impression  Pt presents with admitting diagnosis above. Pt today was able to sit EOB with supervision however very tearful due to pain. Pt reports at baseline her husband helps with all ADLs and mobility using hoyer lift. Pt presents at or near baseline mobility. Pt has no further acute PT needs and will be signing off. Pt requests HHPT upon DC. Re consult PT if mobility status changes.      PT Assessment All further PT needs can be met in the next venue of care  Assistance Needed at Discharge  Frequent or constant Supervision/Assistance    Equipment Recommendations None recommended by PT  Recommendations for Other Services       Precautions/Restrictions Precautions Precautions: Fall Restrictions Weight Bearing Restrictions: Yes RLE Weight Bearing: Weight bearing as tolerated        Mobility  Bed Mobility Rolling: Used rails, Supervision Supine/Sidelying to sit: Supervision Sit to supine/sidelying: Supervision General bed mobility comments: Increased time  Transfers                   General transfer comment: Hoyer at baseline    Ambulation/Gait           General Gait Details: unable  Home Activity Instructions    Stairs            Modified Rankin (Stroke Patients Only)        Balance   Sitting-balance support: Feet supported Sitting balance-Leahy Scale: Fair Sitting balance - Comments: sitting EOB unsupported, able to lean L/R while maintaining balance                   Pertinent Vitals/Pain PT - Brief Vital Signs All Vital Signs Stable: Yes Pain Assessment Pain Assessment: Faces Faces Pain Scale: Hurts worst Pain Location: R AKA surgical site, bottom in sitting Pain Descriptors / Indicators: Crying, Constant, Grimacing, Moaning Pain Intervention(s): Monitored during session, Patient requesting pain meds-RN notified, Repositioned     Home Living Family/patient expects to be discharged to:: Private residence Living Arrangements: Spouse/significant other Available Help at Discharge: Family;Available 24 hours/day     Home Equipment: BSC/3in1;Shower seat;Wheelchair - manual;Other (comment) Secondary school teacher)   Additional Comments: Pt states her husband helps her with all ADLs and uses a hoyer at baseline.    Prior Function Level of Independence: Needs assistance Comments: Hoyer lift    UE/LE Assessment   UE ROM/Strength/Tone/Coordination: WFL    LE ROM/Strength/Tone/Coordination: Impaired LE ROM/Strength/Tone/Coordination Deficits: R AKA    Communication   Communication Communication: No apparent difficulties Cueing Techniques: Verbal cues     Cognition Overall Cognitive Status: Appears within functional limits for tasks assessed/performed       General Comments General comments (skin integrity, edema, etc.): VSS    Exercises     Assessment/Plan    PT Problem List Decreased strength;Pain;Decreased range of motion;Decreased activity tolerance;Decreased balance;Decreased mobility;Decreased knowledge of precautions;Decreased safety awareness;Decreased knowledge of use of DME;Impaired sensation;Decreased skin integrity       PT Visit Diagnosis Muscle weakness (generalized) (M62.81);Difficulty in walking, not elsewhere classified (R26.2);Pain;Other  abnormalities of gait and mobility (R26.89)    No Skilled PT     Co-evaluation                AMPAC 6 Clicks Help needed turning from your back to your side while in a flat bed  without using bedrails?: A Little Help needed moving from lying on your back to sitting on the side of a flat bed without using bedrails?: A Lot Help needed moving to and from a bed to a chair (including a wheelchair)?: Total Help needed standing up from a chair using your arms (e.g., wheelchair or bedside chair)?: Total Help needed to walk in hospital room?: Total Help needed climbing 3-5 steps with a railing? : Total 6 Click Score: 9      End of Session   Activity Tolerance: Patient tolerated treatment well;Patient limited by pain Patient left: in bed;with call bell/phone within reach;with nursing/sitter in room Nurse Communication: Mobility status PT Visit Diagnosis: Muscle weakness (generalized) (M62.81);Difficulty in walking, not elsewhere classified (R26.2);Pain;Other abnormalities of gait and mobility (R26.89) Pain - Right/Left: Right Pain - part of body: Leg     Time: 1101-1117 PT Time Calculation (min) (ACUTE ONLY): 16 min  Charges:   PT Evaluation $PT Eval Moderate Complexity: 1 Mod      Chava Dulac B, PT, DPT Acute Rehab Services [phone]   [name]  02/03/2023, 11:38 AM

## 2023-02-03 NOTE — Plan of Care (Signed)
  Problem: Health Behavior/Discharge Planning: Goal: Ability to manage health-related needs will improve Outcome: Progressing   Problem: Clinical Measurements: Goal: Ability to maintain clinical measurements within normal limits will improve Outcome: Progressing   Problem: Coping: Goal: Level of anxiety will decrease Outcome: Progressing   Problem: Pain Management: Goal: General experience of comfort will improve Outcome: Progressing   Problem: Skin Integrity: Goal: Risk for impaired skin integrity will decrease Outcome: Progressing

## 2023-02-03 NOTE — Progress Notes (Signed)
PROGRESS NOTE        PATIENT DETAILS Name: Deborah [name] Age: 57 y.o. Sex: female Date of Birth: 1965/05/16 Admit Date: 01/29/2023 Admitting Physician Meredeth Ide, MD PCP:Pcp, No  Brief Summary: Patient is a 57 y.o.  female with history of ESRD on HD MWF, PAF-not on any anticoagulation due to recent GI bleeding, HFpEF, DM-2, HTN, chronic pain syndrome on narcotics-who underwent BKA 10/23-presented with a right BKA stump dehiscence.  Significant events: 11/23>> admit to [facility].  Significant studies: None  Significant microbiology data: 11/23>> COVID/influenza/RSV PCR: Negative 11/24>> blood culture: No growth  Procedures: None  Consults: Orthopedics Nephrology  Subjective: Complains of pain in her right stump area-only wants IV Dilaudid while she is in the hospital.  Objective: Vitals: Blood pressure (!) 109/91, pulse 75, temperature 98.4 F (36.9 C), temperature source Oral, resp. rate 16, height 5\' 6"  (1.676 m), weight 113.4 kg, last menstrual period 10/10/2012, SpO2 100%.   Exam: Gen Exam:Alert awake-not in any distress HEENT:atraumatic, normocephalic Chest: B/L clear to auscultation anteriorly CVS:S1S2 regular Abdomen:soft non tender, non distended Extremities: Right BKA. Neurology: Non focal Skin: no rash  Pertinent Labs/Radiology:    Latest Ref Rng & Units 02/03/2023    3:31 AM 02/02/2023    4:58 AM 02/01/2023    4:33 AM  CBC  WBC 4.0 - 10.5 K/uL 14.8  14.2  16.7   Hemoglobin 12.0 - 15.0 g/dL 9.3  9.2  7.7   Hematocrit 36.0 - 46.0 % 29.3  29.9  24.9   Platelets 150 - 400 K/uL 343  319  340     Lab Results  Component Value Date   NA 134 (L) 02/03/2023   K 3.3 (L) 02/03/2023   CL 105 02/03/2023   CO2 23 02/03/2023      Assessment/Plan: Sepsis due to right BKA stump infection with wound dehiscence (recent right calcaneal osteomyelitis requiring BKA 10/23) Sepsis physiology has resolved-continues to have mild  leukocytosis S/p right BKA revision 11/27 Per orthopedics-does not require any further antibiotics-stop today. Mobilize with PT/OT-nonweightbearing on the right lower extremity. Has narcotic seeking behavior-only wants IV Dilaudid-on chronic Dilaudid at home.  She is aware that we will not adjust Dilaudid dosage any further.  ESRD on HD MWF Missed HD Wednesday/Friday Nephrology following and directing care  Hypokalemia Replete/recheck  Generalized body tremors/jerking movement Occurred 11/25-no recurrence since then. Thought to be functional-resolved-neurology briefly curb sided at bedside.   As needed IV Ativan  Although has history of tardive dyskinesia-this was not felt to be tardive dyskinesia.    Normocytic anemia Secondary to ESRD-recent history of GI bleeding Hb stable-did require 1 unit of PRBC on 11/25.   Follow CBC  History of GI bleeding October 2024-thought to be due to AVMs noted on EGD PPI Follow CBC No melena/hematochezia noted.  History of NASH Supportive care  PAF Telemetry monitoring No longer on anticoagulation due to recent GI bleeding/severity of anemia Metoprolol  Chronic HFpEF Volume status stable Volume removal with HD  DM-2 (A1c 8.1 on 4/13) CBG stable-SSI  Recent Labs    02/02/23 2337 02/03/23 0204 02/03/23 0758  GLUCAP 403* 398* 236*     Chronic pain syndrome Continue narcotics-on Dilaudid at home-follows with pain management in the outpatient setting.  History of malignant duodenal carcinoid-s/p resection Stable for continued outpatient follow-up with oncology/PCP  History of tardive  dyskinesia Supportive care  Anxiety/mood disorder Resume Remeron/BuSpar As needed Atarax  Debility/deconditioning PT/OT eval  Morbid Obesity: Estimated body mass index is 40.35 kg/m as calculated from the following:   Height as of this encounter: 5\' 6"  (1.676 m).   Weight as of this encounter: 113.4 kg.   Code status:   Code Status:  Full Code   DVT Prophylaxis: SCD's Start: 02/02/23 1640 heparin injection 5,000 Units Start: 01/29/23 2200   Family Communication: Significant other at bedside on 11/25-none at bedside this morning.  Disposition Plan: Status is: Inpatient Remains inpatient appropriate because: Severity of illness   Planned Discharge Destination:Home   Diet: Diet Order             Diet Carb Modified Fluid consistency: Thin; Room service appropriate? Yes  Diet effective now                     Antimicrobial agents: Anti-infectives (From admission, onward)    Start     Dose/Rate Route Frequency Ordered Stop   02/02/23 1730  ceFAZolin (ANCEF) IVPB 2g/100 mL premix  Status:  Discontinued        2 g 200 mL/hr over 30 Minutes Intravenous Every 8 hours 02/02/23 1640 02/02/23 1701   02/02/23 1038  vancomycin (VANCOCIN) powder  Status:  Discontinued          As needed 02/02/23 1038 02/02/23 1100   02/02/23 0930  ceFAZolin (ANCEF) IVPB 3g/100 mL premix        3 g 200 mL/hr over 30 Minutes Intravenous On call to O.R. 02/02/23 0923 02/02/23 1055   01/31/23 1700  cefTRIAXone (ROCEPHIN) 2 g in sodium chloride 0.9 % 100 mL IVPB        2 g 200 mL/hr over 30 Minutes Intravenous Daily 01/31/23 1644     01/30/23 1800  ceFEPIme (MAXIPIME) 1 g in sodium chloride 0.9 % 100 mL IVPB  Status:  Discontinued        1 g 200 mL/hr over 30 Minutes Intravenous Every 24 hours 01/29/23 1705 01/31/23 1644   01/30/23 1700  vancomycin (VANCOCIN) IVPB 1000 mg/200 mL premix        1,000 mg 200 mL/hr over 60 Minutes Intravenous Once per day on Sunday Tuesday Friday 01/30/23 1126     01/29/23 1945  vancomycin (VANCOCIN) IVPB 1000 mg/200 mL premix        1,000 mg 200 mL/hr over 60 Minutes Intravenous Every 1 hr x 2 01/29/23 1846 01/29/23 2205   01/29/23 1930  vancomycin (VANCOREADY) IVPB 2000 mg/400 mL  Status:  Discontinued        2,000 mg 200 mL/hr over 120 Minutes Intravenous  Once 01/29/23 1840 01/29/23 1846    01/29/23 1630  vancomycin (VANCOCIN) IVPB 1000 mg/200 mL premix  Status:  Discontinued       Placed in "Followed by" Linked Group   1,000 mg 200 mL/hr over 60 Minutes Intravenous  Once 01/29/23 1512 01/29/23 1944   01/29/23 1515  ceFEPIme (MAXIPIME) 1 g in sodium chloride 0.9 % 100 mL IVPB        1 g 200 mL/hr over 30 Minutes Intravenous  Once 01/29/23 1512 01/29/23 1839   01/29/23 1515  metroNIDAZOLE (FLAGYL) IVPB 500 mg        50 0 mg 100 mL/hr over 60 Minutes Intravenous  Once 01/29/23 1512 01/29/23 1736   01/29/23 1515  vancomycin (VANCOCIN) IVPB 1000 mg/200 mL premix  Status:  Discontinued  Placed in "Followed by" Linked Group   1,000 mg 200 mL/hr over 60 Minutes Intravenous  Once 01/29/23 1512 01/29/23 1945        MEDICATIONS: Scheduled Meds:  sodium chloride   Intravenous Once   sodium chloride   Intravenous Once   vitamin C  1,000 mg Oral Daily   busPIRone  5 mg Oral TID   darbepoetin (ARANESP) injection - DIALYSIS  100 mcg Subcutaneous Q Sat-1800   ferric citrate  210 mg Oral TID WC   Gerhardt's butt cream   Topical TID   heparin  5,000 Units Subcutaneous Q8H   insulin aspart  0-6 Units Subcutaneous TID WC   metoprolol tartrate  50 mg Oral BID   midodrine  10 mg Oral TID WC   mirtazapine  15 mg Oral QHS   nutrition supplement (JUVEN)  1 packet Oral BID BM   pantoprazole  40 mg Oral BID   polyethylene glycol  17 g Oral BID   potassium chloride  40 mEq Oral Once   senna-docusate  2 tablet Oral QHS   sodium chloride flush  3 mL Intravenous Q12H   zinc sulfate (50mg  elemental zinc)  220 mg Oral Daily   Continuous Infusions:  cefTRIAXone (ROCEPHIN)  IV 2 g (02/02/23 1304)   vancomycin 1,000 mg (02/01/23 1136)   PRN Meds:.acetaminophen **OR** acetaminophen, acetaminophen, bisacodyl, guaiFENesin-dextromethorphan, hydrALAZINE, HYDROmorphone (DILAUDID) injection, hydrOXYzine, labetalol, loperamide, LORazepam, methocarbamol, metoprolol tartrate, ondansetron **OR**  ondansetron (ZOFRAN) IV, phenol, sodium chloride flush   I have personally reviewed following labs and imaging studies  LABORATORY DATA: CBC: Recent Labs  Lab 01/27/23 1355 01/29/23 1430 01/29/23 2320 01/30/23 0434 01/31/23 0750 01/31/23 2325 02/01/23 0433 02/02/23 0458 02/03/23 0331  WBC 15.3* 18.7*   < > 15.2* 14.3*  --  16.7* 14.2* 14.8*  NEUTROABS 12.1* 14.1*  --   --   --   --   --   --   --   HGB 7.3* 7.8*   < > 7.4* 6.8* 7.6* 7.7* 9.2* 9.3*  HCT 23.7* 25.7*   < > 24.1* 23.1* 24.8* 24.9* 29.9* 29.3*  MCV 94.4 96.3   < > 94.9 96.3  --  94.7 93.7 93.0  PLT 403* 425*   < > 398 342  --  340 319 343   < > = values in this interval not displayed.    Basic Metabolic Panel: Recent Labs  Lab 01/29/23 2320 01/30/23 0434 01/31/23 0750 02/01/23 0433 02/02/23 0458 02/03/23 0331  NA  --  139 135 137 137 134*  K  --  3.4* 3.4* 3.3* 3.2* 3.3*  CL  --  107 101 104 103 105  CO2  --  21* 27 23 23 23   GLUCOSE  --  103* 125* 171* 214* 358*  BUN  --  17 11 15 11 19   CREATININE 5.50* 5.91* 4.09* 4.71* 3.36* 3.92*  CALCIUM  --  7.9* 7.9* 7.9* 8.1* 8.3*  PHOS 4.4  --  3.4 4.1 3.1 3.2    GFR: Estimated Creatinine Clearance: 20 mL/min (A) (by C-G formula based on SCr of 3.92 mg/dL (H)).  Liver Function Tests: Recent Labs  Lab 01/27/23 1355 01/30/23 0434 01/31/23 0750 02/01/23 0433 02/02/23 0458 02/03/23 0331  AST 17 15  --   --   --   --   ALT 10 9  --   --   --   --   ALKPHOS 89 97  --   --   --   --  BILITOT 0.8 0.7  --   --   --   --   PROT 6.6 6.2*  --   --   --   --   ALBUMIN 2.0* 2.0* 1.8* 1.7* 1.9* 1.9*   Recent Labs  Lab 01/27/23 1355  LIPASE 21   No results for input(s): "AMMONIA" in the last 168 hours.  Coagulation Profile: No results for input(s): "INR", "PROTIME" in the last 168 hours.  Cardiac Enzymes: No results for input(s): "CKTOTAL", "CKMB", "CKMBINDEX", "TROPONINI" in the last 168 hours.  BNP (last 3 results) No results for input(s):  "PROBNP" in the last 8760 hours.  Lipid Profile: No results for input(s): "CHOL", "HDL", "LDLCALC", "TRIG", "CHOLHDL", "LDLDIRECT" in the last 72 hours.  Thyroid Function Tests: No results for input(s): "TSH", "T4TOTAL", "FREET4", "T3FREE", "THYROIDAB" in the last 72 hours.  Anemia Panel: No results for input(s): "VITAMINB12", "FOLATE", "FERRITIN", "TIBC", "IRON", "RETICCTPCT" in the last 72 hours.  Urine analysis:    Component Value Date/Time   COLORURINE STRAW (A) 06/19/2022 0638   APPEARANCEUR HAZY (A) 06/19/2022 0638   LABSPEC 1.010 06/19/2022 0638   PHURINE 5.0 06/19/2022 0638   GLUCOSEU NEGATIVE 06/19/2022 0638   HGBUR SMALL (A) 06/19/2022 0638   BILIRUBINUR NEGATIVE 06/19/2022 0638   KETONESUR NEGATIVE 06/19/2022 0638   PROTEINUR 30 (A) 06/19/2022 0638   UROBILINOGEN 0.2 10/02/2013 2108   NITRITE NEGATIVE 06/19/2022 0638   LEUKOCYTESUR LARGE (A) 06/19/2022 0638    Sepsis Labs: Lactic Acid, Venous    Component Value Date/Time   LATICACIDVEN 1.6 01/30/2023 0543    MICROBIOLOGY: Recent Results (from the past 240 hour(s))  Blood culture (routine x 2)     Status: None   Collection Time: 01/29/23  2:19 PM   Specimen: BLOOD  Result Value Ref Range Status   Specimen Description BLOOD SITE NOT SPECIFIED  Final   Special Requests   Final    BOTTLES DRAWN AEROBIC AND ANAEROBIC Blood Culture adequate volume   Culture   Final    NO GROWTH 5 DAYS Performed at [facility], [address], [place], [place] [address]    Report Status 02/03/2023 FINAL  Final  Resp panel by RT-PCR (RSV, Flu A&B, Covid) Anterior Nasal Swab     Status: None   Collection Time: 01/29/23  3:05 PM   Specimen: Anterior Nasal Swab  Result Value Ref Range Status   SARS Coronavirus 2 by RT PCR NEGATIVE NEGATIVE Final   Influenza A by PCR NEGATIVE NEGATIVE Final   Influenza B by PCR NEGATIVE NEGATIVE Final    Comment: (NOTE) The Xpert Xpress SARS-CoV-2/FLU/RSV plus assay is intended as an  aid in the diagnosis of influenza from Nasopharyngeal swab specimens and should not be used as a sole basis for treatment. Nasal washings and aspirates are unacceptable for Xpert Xpress SARS-CoV-2/FLU/RSV testing.  Fact Sheet for Patients: BloggerCourse.com  Fact Sheet for Healthcare Providers: SeriousBroker.it  This test is not yet approved or cleared by the [place] FDA and has been authorized for detection and/or diagnosis of SARS-CoV-2 by FDA under an Emergency Use Authorization (EUA). This EUA will remain in effect (meaning this test can be used) for the duration of the COVID-19 declaration under Section 564(b)(1) of the Act, 21 U.S.C. section 360bbb-3(b)(1), unless the authorization is terminated or revoked.     Resp Syncytial Virus by PCR NEGATIVE NEGATIVE Final    Comment: (NOTE) Fact Sheet for Patients: BloggerCourse.com  Fact Sheet for Healthcare Providers: SeriousBroker.it  This  test is not yet approved or cleared by the [place] and has been authorized for detection and/or diagnosis of SARS-CoV-2 by FDA under an Emergency Use Authorization (EUA). This EUA will remain in effect (meaning this test can be used) for the duration of the COVID-19 declaration under Section 564(b)(1) of the Act, 21 U.S.C. section 360bbb-3(b)(1), unless the authorization is terminated or revoked.  Performed at [facility], [address], [place], [place] [address]   Blood culture (routine x 2)     Status: None (Preliminary result)   Collection Time: 01/30/23  4:36 AM   Specimen: BLOOD RIGHT HAND  Result Value Ref Range Status   Specimen Description BLOOD RIGHT HAND  Final   Special Requests   Final    BOTTLES DRAWN AEROBIC ONLY Blood Culture results may not be optimal due to an inadequate volume of blood received in culture bottles   Culture   Final    NO GROWTH 4  DAYS Performed at [facility], [address], [place], [place] [address]    Report Status PENDING  Incomplete  MRSA Next Gen by PCR, Nasal     Status: None   Collection Time: 02/01/23 10:57 PM   Specimen: Nasal Mucosa; Nasal Swab  Result Value Ref Range Status   MRSA by PCR Next Gen NOT DETECTED NOT DETECTED Final    Comment: (NOTE) The GeneXpert MRSA Assay (FDA approved for NASAL specimens only), is one component of a comprehensive MRSA colonization surveillance program. It is not intended to diagnose MRSA infection nor to guide or monitor treatment for MRSA infections. Test performance is not FDA approved in patients less than 58 years old. Performed at [facility], [address], [place], [place] [address]     RADIOLOGY STUDIES/RESULTS: No results found.   LOS: 5 days   Jeoffrey Massed, MD  [facility]    To contact the attending provider between 7A-7P or the covering provider during after hours 7P-7A, please log into the web site [email or web address] and access using universal [place] password for that web site. If you do not have the password, please call the hospital operator.  02/03/2023, 11:07 AM

## 2023-02-04 ENCOUNTER — Encounter (HOSPITAL_COMMUNITY): Payer: Self-pay | Admitting: Orthopedic Surgery

## 2023-02-04 DIAGNOSIS — Z5189 Encounter for other specified aftercare: Secondary | ICD-10-CM | POA: Diagnosis not present

## 2023-02-04 DIAGNOSIS — T8130XA Disruption of wound, unspecified, initial encounter: Secondary | ICD-10-CM | POA: Diagnosis not present

## 2023-02-04 DIAGNOSIS — T148XXA Other injury of unspecified body region, initial encounter: Secondary | ICD-10-CM | POA: Diagnosis not present

## 2023-02-04 DIAGNOSIS — N186 End stage renal disease: Secondary | ICD-10-CM | POA: Diagnosis not present

## 2023-02-04 LAB — RENAL FUNCTION PANEL
Albumin: 1.8 g/dL — ABNORMAL LOW (ref 3.5–5.0)
Anion gap: 9 (ref 5–15)
BUN: 24 mg/dL — ABNORMAL HIGH (ref 6–20)
CO2: 23 mmol/L (ref 22–32)
Calcium: 8.4 mg/dL — ABNORMAL LOW (ref 8.9–10.3)
Chloride: 106 mmol/L (ref 98–111)
Creatinine, Ser: 3.95 mg/dL — ABNORMAL HIGH (ref 0.44–1.00)
GFR, Estimated: 13 mL/min — ABNORMAL LOW (ref >60)
Glucose, Bld: 109 mg/dL — ABNORMAL HIGH (ref 70–99)
Phosphorus: 3.1 mg/dL (ref 2.5–4.6)
Potassium: 3.7 mmol/L (ref 3.5–5.1)
Sodium: 138 mmol/L (ref 135–145)

## 2023-02-04 LAB — CULTURE, BLOOD (ROUTINE X 2): Culture: NO GROWTH

## 2023-02-04 LAB — GLUCOSE, CAPILLARY
Glucose-Capillary: 81 mg/dL (ref 70–99)
Glucose-Capillary: 92 mg/dL (ref 70–99)
Glucose-Capillary: 106 mg/dL — ABNORMAL HIGH (ref 70–99)
Glucose-Capillary: 128 mg/dL — ABNORMAL HIGH (ref 70–99)

## 2023-02-04 LAB — HEMOGLOBIN A1C
Hgb A1c MFr Bld: 5.7 % — ABNORMAL HIGH (ref 4.8–5.6)
Mean Plasma Glucose: 116.89 mg/dL

## 2023-02-04 MED ORDER — INSULIN ASPART 100 UNIT/ML IJ SOLN
0.0000 [IU] | Freq: Three times a day (TID) | INTRAMUSCULAR | Status: DC
Start: 2023-02-05 — End: 2023-02-16
  Administered 2023-02-05: 1 [IU] via SUBCUTANEOUS
  Administered 2023-02-05 – 2023-02-06 (×2): 2 [IU] via SUBCUTANEOUS
  Administered 2023-02-06 – 2023-02-07 (×3): 1 [IU] via SUBCUTANEOUS
  Administered 2023-02-07: 3 [IU] via SUBCUTANEOUS
  Administered 2023-02-08 (×2): 2 [IU] via SUBCUTANEOUS
  Administered 2023-02-08: 1 [IU] via SUBCUTANEOUS
  Administered 2023-02-09 (×2): 2 [IU] via SUBCUTANEOUS
  Administered 2023-02-10: 1 [IU] via SUBCUTANEOUS
  Administered 2023-02-10 – 2023-02-14 (×3): 2 [IU] via SUBCUTANEOUS
  Administered 2023-02-14: 1 [IU] via SUBCUTANEOUS
  Administered 2023-02-15 (×2): 3 [IU] via SUBCUTANEOUS

## 2023-02-04 NOTE — Progress Notes (Signed)
PROGRESS NOTE        PATIENT DETAILS Name: Deborah [name] Age: 57 y.o. Sex: female Date of Birth: 09-Sep-1965 Admit Date: 01/29/2023 Admitting Physician Meredeth Ide, MD PCP:Pcp, No  Brief Summary: Patient is a 57 y.o.  female with history of ESRD on HD MWF, PAF-not on any anticoagulation due to recent GI bleeding, HFpEF, DM-2, HTN, chronic pain syndrome on narcotics-who underwent BKA 10/23-presented with a right BKA stump dehiscence.  Significant events: 11/23>> admit to [facility].  Significant studies: None  Significant microbiology data: 11/23>> COVID/influenza/RSV PCR: Negative 11/24>> blood culture: No growth  Procedures: 11/27>> right BKA revision  Consults: Orthopedics Nephrology  Subjective: Reportedly had a panic attack earlier this morning-during my evaluation-she was awake/alert-pain controlled with IV Dilaudid.  Objective: Vitals: Blood pressure 137/72, pulse 71, temperature 98 F (36.7 C), temperature source Oral, resp. rate 19, height 5\' 6"  (1.676 m), weight 113.4 kg, last menstrual period 10/10/2012, SpO2 100%.   Exam: Gen Exam:Alert awake-not in any distress HEENT:atraumatic, normocephalic Chest: B/L clear to auscultation anteriorly CVS:S1S2 regular Abdomen:soft non tender, non distended Extremities: Right BKA Neurology: Non focal Skin: no rash  Pertinent Labs/Radiology:    Latest Ref Rng & Units 02/03/2023    3:31 AM 02/02/2023    4:58 AM 02/01/2023    4:33 AM  CBC  WBC 4.0 - 10.5 K/uL 14.8  14.2  16.7   Hemoglobin 12.0 - 15.0 g/dL 9.3  9.2  7.7   Hematocrit 36.0 - 46.0 % 29.3  29.9  24.9   Platelets 150 - 400 K/uL 343  319  340     Lab Results  Component Value Date   NA 138 02/04/2023   K 3.7 02/04/2023   CL 106 02/04/2023   CO2 23 02/04/2023      Assessment/Plan: Sepsis due to right BKA stump infection with wound dehiscence (recent right calcaneal osteomyelitis requiring BKA 10/23) Sepsis physiology  has resolved-continues to have mild leukocytosis S/p right BKA revision 11/27-no longer on any antibiotics Mobilize with PT/OT-nonweightbearing on the right lower extremity. Has narcotic seeking behavior-only wants IV Dilaudid-on chronic Dilaudid at home.  She is aware that we will not adjust Dilaudid dosage any further.  ESRD on HD MWF Missed HD Wednesday/Friday prior to this hospitalization Nephrology following and directing care  Hypokalemia Repleted.  Generalized body tremors/jerking movement Occurred 11/25-no recurrence since then. Thought to be functional-resolved-neurology briefly curb sided at bedside.   As needed IV Ativan  Although has history of tardive dyskinesia-this was not felt to be tardive dyskinesia.    Normocytic anemia Secondary to ESRD-recent history of GI bleeding Hb stable-did require 1 unit of PRBC on 11/25.   Follow CBC  History of GI bleeding October 2024-thought to be due to AVMs noted on EGD PPI Follow CBC No melena/hematochezia noted.  History of NASH Supportive care  PAF Telemetry monitoring No longer on anticoagulation due to recent GI bleeding/severity of anemia Metoprolol  Chronic HFpEF Volume status stable Volume removal with HD  DM-2 (A1c 8.1 on 4/13) CBG stable-SSI  Recent Labs    02/03/23 1538 02/03/23 2226 02/04/23 0808  GLUCAP 208* 190* 81     Chronic pain syndrome Continue narcotics-on Dilaudid at home-follows with pain management in the outpatient setting. See above regarding narcotic seeking behavior  History of malignant duodenal carcinoid-s/p resection Stable for continued outpatient follow-up with  oncology/PCP  History of tardive dyskinesia Supportive care  Anxiety/mood disorder Resume Remeron/BuSpar As needed Atarax  Debility/deconditioning PT/OT eval-Home health recommended  Morbid Obesity: Estimated body mass index is 40.35 kg/m as calculated from the following:   Height as of this encounter: 5\' 6"   (1.676 m).   Weight as of this encounter: 113.4 kg.   Code status:   Code Status: Full Code   DVT Prophylaxis: SCD's Start: 02/02/23 1640 heparin injection 5,000 Units Start: 01/29/23 2200   Family Communication: Significant other at bedside on 11/25-none at bedside this morning.  Disposition Plan: Status is: Inpatient Remains inpatient appropriate because: Severity of illness   Planned Discharge Destination:Home   Diet: Diet Order             Diet Carb Modified Fluid consistency: Thin; Room service appropriate? Yes  Diet effective now                     Antimicrobial agents: Anti-infectives (From admission, onward)    Start     Dose/Rate Route Frequency Ordered Stop   02/02/23 1730  ceFAZolin (ANCEF) IVPB 2g/100 mL premix  Status:  Discontinued        2 g 200 mL/hr over 30 Minutes Intravenous Every 8 hours 02/02/23 1640 02/02/23 1701   02/02/23 1038  vancomycin (VANCOCIN) powder  Status:  Discontinued          As needed 02/02/23 1038 02/02/23 1100   02/02/23 0930  ceFAZolin (ANCEF) IVPB 3g/100 mL premix        3 g 200 mL/hr over 30 Minutes Intravenous On call to O.R. 02/02/23 0923 02/02/23 1055   01/31/23 1700  cefTRIAXone (ROCEPHIN) 2 g in sodium chloride 0.9 % 100 mL IVPB        2 g 200 mL/hr over 30 Minutes Intravenous Daily 01/31/23 1644 02/03/23 2359   01/30/23 1800  ceFEPIme (MAXIPIME) 1 g in sodium chloride 0.9 % 100 mL IVPB  Status:  Discontinued        1 g 200 mL/hr over 30 Minutes Intravenous Every 24 hours 01/29/23 1705 01/31/23 1644   01/30/23 1700  vancomycin (VANCOCIN) IVPB 1000 mg/200 mL premix        1,000 mg 200 mL/hr over 60 Minutes Intravenous Once per day on Sunday Tuesday Friday 01/30/23 1126 02/01/23 1236   01/29/23 1945  vancomycin (VANCOCIN) IVPB 1000 mg/200 mL premix        1,000 mg 200 mL/hr over 60 Minutes Intravenous Every 1 hr x 2 01/29/23 1846 01/29/23 2205   01/29/23 1930  vancomycin (VANCOREADY) IVPB 2000 mg/400 mL  Status:   Discontinued        2,000 mg 200 mL/hr over 120 Minutes Intravenous  Once 01/29/23 1840 01/29/23 1846   01/29/23 1630  vancomycin (VANCOCIN) IVPB 1000 mg/200 mL premix  Status:  Discontinued       Placed in "Followed by" Linked Group   1,000 mg 200 mL/hr over 60 Minutes Intravenous  Once 01/29/23 1512 01/29/23 1944   01/29/23 1515  ceFEPIme (MAXIPIME) 1 g in sodium chloride 0.9 % 100 mL IVPB        1 g 200 mL/hr over 30 Minutes Intravenous  Once 01/29/23 1512 01/29/23 1839   01/29/23 1515  metroNIDAZOLE (FLAGYL) IVPB 500 mg        50 0 mg 100 mL/hr over 60 Minutes Intravenous  Once 01/29/23 1512 01/29/23 1736   01/29/23 1515  vancomycin (VANCOCIN) IVPB 1000 mg/200 mL premix  Status:  Discontinued       Placed in "Followed by" Linked Group   1,000 mg 200 mL/hr over 60 Minutes Intravenous  Once 01/29/23 1512 01/29/23 1945        MEDICATIONS: Scheduled Meds:  sodium chloride   Intravenous Once   sodium chloride   Intravenous Once   vitamin C  1,000 mg Oral Daily   busPIRone  5 mg Oral TID   darbepoetin (ARANESP) injection - DIALYSIS  100 mcg Subcutaneous Q Sat-1800   ferric citrate  210 mg Oral TID WC   Gerhardt's butt cream   Topical TID   heparin  5,000 Units Subcutaneous Q8H   insulin aspart  0-6 Units Subcutaneous TID WC   metoprolol tartrate  50 mg Oral BID   midodrine  10 mg Oral TID WC   mirtazapine  15 mg Oral QHS   nutrition supplement (JUVEN)  1 packet Oral BID BM   pantoprazole  40 mg Oral BID   polyethylene glycol  17 g Oral BID   potassium chloride  40 mEq Oral Once   senna-docusate  2 tablet Oral QHS   sodium chloride flush  3 mL Intravenous Q12H   zinc sulfate (50mg  elemental zinc)  220 mg Oral Daily   Continuous Infusions:   PRN Meds:.acetaminophen, bisacodyl, guaiFENesin-dextromethorphan, hydrALAZINE, HYDROmorphone (DILAUDID) injection, hydrOXYzine, labetalol, loperamide, LORazepam, methocarbamol, metoprolol tartrate, ondansetron **OR** ondansetron (ZOFRAN)  IV, phenol, sodium chloride flush   I have personally reviewed following labs and imaging studies  LABORATORY DATA: CBC: Recent Labs  Lab 01/29/23 1430 01/29/23 2320 01/30/23 0434 01/31/23 0750 01/31/23 2325 02/01/23 0433 02/02/23 0458 02/03/23 0331  WBC 18.7*   < > 15.2* 14.3*  --  16.7* 14.2* 14.8*  NEUTROABS 14.1*  --   --   --   --   --   --   --   HGB 7.8*   < > 7.4* 6.8* 7.6* 7.7* 9.2* 9.3*  HCT 25.7*   < > 24.1* 23.1* 24.8* 24.9* 29.9* 29.3*  MCV 96.3   < > 94.9 96.3  --  94.7 93.7 93.0  PLT 425*   < > 398 342  --  340 319 343   < > = values in this interval not displayed.    Basic Metabolic Panel: Recent Labs  Lab 01/31/23 0750 02/01/23 0433 02/02/23 0458 02/03/23 0331 02/04/23 0446  NA 135 137 137 134* 138  K 3.4* 3.3* 3.2* 3.3* 3.7  CL 101 104 103 105 106  CO2 27 23 23 23 23   GLUCOSE 125* 171* 214* 358* 109*  BUN 11 15 11 19  24*  CREATININE 4.09* 4.71* 3.36* 3.92* 3.95*  CALCIUM 7.9* 7.9* 8.1* 8.3* 8.4*  PHOS 3.4 4.1 3.1 3.2 3.1    GFR: Estimated Creatinine Clearance: 19.8 mL/min (A) (by C-G formula based on SCr of 3.95 mg/dL (H)).  Liver Function Tests: Recent Labs  Lab 01/30/23 0434 01/31/23 0750 02/01/23 0433 02/02/23 0458 02/03/23 0331 02/04/23 0446  AST 15  --   --   --   --   --   ALT 9  --   --   --   --   --   ALKPHOS 97  --   --   --   --   --   BILITOT 0.7  --   --   --   --   --   PROT 6.2*  --   --   --   --   --  ALBUMIN 2.0* 1.8* 1.7* 1.9* 1.9* 1.8*   No results for input(s): "LIPASE", "AMYLASE" in the last 168 hours.  No results for input(s): "AMMONIA" in the last 168 hours.  Coagulation Profile: No results for input(s): "INR", "PROTIME" in the last 168 hours.  Cardiac Enzymes: No results for input(s): "CKTOTAL", "CKMB", "CKMBINDEX", "TROPONINI" in the last 168 hours.  BNP (last 3 results) No results for input(s): "PROBNP" in the last 8760 hours.  Lipid Profile: No results for input(s): "CHOL", "HDL", "LDLCALC",  "TRIG", "CHOLHDL", "LDLDIRECT" in the last 72 hours.  Thyroid Function Tests: No results for input(s): "TSH", "T4TOTAL", "FREET4", "T3FREE", "THYROIDAB" in the last 72 hours.  Anemia Panel: No results for input(s): "VITAMINB12", "FOLATE", "FERRITIN", "TIBC", "IRON", "RETICCTPCT" in the last 72 hours.  Urine analysis:    Component Value Date/Time   COLORURINE STRAW (A) 06/19/2022 0638   APPEARANCEUR HAZY (A) 06/19/2022 0638   LABSPEC 1.010 06/19/2022 0638   PHURINE 5.0 06/19/2022 0638   GLUCOSEU NEGATIVE 06/19/2022 0638   HGBUR SMALL (A) 06/19/2022 0638   BILIRUBINUR NEGATIVE 06/19/2022 0638   KETONESUR NEGATIVE 06/19/2022 0638   PROTEINUR 30 (A) 06/19/2022 0638   UROBILINOGEN 0.2 10/02/2013 2108   NITRITE NEGATIVE 06/19/2022 0638   LEUKOCYTESUR LARGE (A) 06/19/2022 0638    Sepsis Labs: Lactic Acid, Venous    Component Value Date/Time   LATICACIDVEN 1.6 01/30/2023 0543    MICROBIOLOGY: Recent Results (from the past 240 hour(s))  Blood culture (routine x 2)     Status: None   Collection Time: 01/29/23  2:19 PM   Specimen: BLOOD  Result Value Ref Range Status   Specimen Description BLOOD SITE NOT SPECIFIED  Final   Special Requests   Final    BOTTLES DRAWN AEROBIC AND ANAEROBIC Blood Culture adequate volume   Culture   Final    NO GROWTH 5 DAYS Performed at [facility], [address] [place], [place] [address]    Report Status 02/03/2023 FINAL  Final  Resp panel by RT-PCR (RSV, Flu A&B, Covid) Anterior Nasal Swab     Status: None   Collection Time: 01/29/23  3:05 PM   Specimen: Anterior Nasal Swab  Result Value Ref Range Status   SARS Coronavirus 2 by RT PCR NEGATIVE NEGATIVE Final   Influenza A by PCR NEGATIVE NEGATIVE Final   Influenza B by PCR NEGATIVE NEGATIVE Final    Comment: (NOTE) The Xpert Xpress SARS-CoV-2/FLU/RSV plus assay is intended as an aid in the diagnosis of influenza from Nasopharyngeal swab specimens and should not be used as a sole  basis for treatment. Nasal washings and aspirates are unacceptable for Xpert Xpress SARS-CoV-2/FLU/RSV testing.  Fact Sheet for Patients: BloggerCourse.com  Fact Sheet for Healthcare Providers: SeriousBroker.it  This test is not yet approved or cleared by the [place] FDA and has been authorized for detection and/or diagnosis of SARS-CoV-2 by FDA under an Emergency Use Authorization (EUA). This EUA will remain in effect (meaning this test can be used) for the duration of the COVID-19 declaration under Section 564(b)(1) of the Act, 21 U.S.C. section 360bbb-3(b)(1), unless the authorization is terminated or revoked.     Resp Syncytial Virus by PCR NEGATIVE NEGATIVE Final    Comment: (NOTE) Fact Sheet for Patients: BloggerCourse.com  Fact Sheet for Healthcare Providers: SeriousBroker.it  This test is not yet approved or cleared by the [place] FDA and has been authorized for detection and/or diagnosis of SARS-CoV-2 by FDA under an Emergency Use Authorization (EUA). This  EUA will remain in effect (meaning this test can be used) for the duration of the COVID-19 declaration under Section 564(b)(1) of the Act, 21 U.S.C. section 360bbb-3(b)(1), unless the authorization is terminated or revoked.  Performed at [facility], [address], [place], [place] [address]   Blood culture (routine x 2)     Status: None   Collection Time: 01/30/23  4:36 AM   Specimen: BLOOD RIGHT HAND  Result Value Ref Range Status   Specimen Description BLOOD RIGHT HAND  Final   Special Requests   Final    BOTTLES DRAWN AEROBIC ONLY Blood Culture results may not be optimal due to an inadequate volume of blood received in culture bottles   Culture   Final    NO GROWTH 5 DAYS Performed at [facility], [address], [place], [place] [address]    Report Status 02/04/2023 FINAL  Final   MRSA Next Gen by PCR, Nasal     Status: None   Collection Time: 02/01/23 10:57 PM   Specimen: Nasal Mucosa; Nasal Swab  Result Value Ref Range Status   MRSA by PCR Next Gen NOT DETECTED NOT DETECTED Final    Comment: (NOTE) The GeneXpert MRSA Assay (FDA approved for NASAL specimens only), is one component of a comprehensive MRSA colonization surveillance program. It is not intended to diagnose MRSA infection nor to guide or monitor treatment for MRSA infections. Test performance is not FDA approved in patients less than 42 years old. Performed at [facility], [address] [place], [place] [address]     RADIOLOGY STUDIES/RESULTS: No results found.   LOS: 6 days   Jeoffrey Massed, MD  [facility]    To contact the attending provider between 7A-7P or the covering provider during after hours 7P-7A, please log into the web site [email or web address] and access using universal [place] password for that web site. If you do not have the password, please call the hospital operator.  02/04/2023, 11:33 AM

## 2023-02-04 NOTE — Plan of Care (Signed)
  Problem: Education: Goal: Knowledge of General Education information will improve Description: Including pain rating scale, medication(s)/side effects and non-pharmacologic comfort measures Outcome: Progressing   Problem: Health Behavior/Discharge Planning: Goal: Ability to manage health-related needs will improve Outcome: Progressing   Problem: Clinical Measurements: Goal: Ability to maintain clinical measurements within normal limits will improve Outcome: Progressing Goal: Will remain free from infection Outcome: Progressing Goal: Diagnostic test results will improve Outcome: Progressing Goal: Respiratory complications will improve Outcome: Progressing Goal: Cardiovascular complication will be avoided Outcome: Progressing   Problem: Activity: Goal: Risk for activity intolerance will decrease Outcome: Progressing   Problem: Nutrition: Goal: Adequate nutrition will be maintained Outcome: Progressing   Problem: Coping: Goal: Level of anxiety will decrease Outcome: Progressing   Problem: Elimination: Goal: Will not experience complications related to bowel motility Outcome: Progressing Goal: Will not experience complications related to urinary retention Outcome: Progressing   Problem: Pain Management: Goal: General experience of comfort will improve Outcome: Progressing   Problem: Safety: Goal: Ability to remain free from injury will improve Outcome: Progressing   Problem: Skin Integrity: Goal: Risk for impaired skin integrity will decrease Outcome: Progressing   Problem: Education: Goal: Ability to describe self-care measures that may prevent or decrease complications (Diabetes Survival Skills Education) will improve Outcome: Progressing Goal: Individualized Educational Video(s) Outcome: Progressing   Problem: Coping: Goal: Ability to adjust to condition or change in health will improve Outcome: Progressing   Problem: Fluid Volume: Goal: Ability to  maintain a balanced intake and output will improve Outcome: Progressing   Problem: Health Behavior/Discharge Planning: Goal: Ability to identify and utilize available resources and services will improve Outcome: Progressing Goal: Ability to manage health-related needs will improve Outcome: Progressing   Problem: Metabolic: Goal: Ability to maintain appropriate glucose levels will improve Outcome: Progressing   Problem: Nutritional: Goal: Maintenance of adequate nutrition will improve Outcome: Progressing Goal: Progress toward achieving an optimal weight will improve Outcome: Progressing   Problem: Skin Integrity: Goal: Risk for impaired skin integrity will decrease Outcome: Progressing   Problem: Tissue Perfusion: Goal: Adequacy of tissue perfusion will improve Outcome: Progressing   Problem: Education: Goal: Knowledge of the prescribed therapeutic regimen will improve Outcome: Progressing Goal: Ability to verbalize activity precautions or restrictions will improve Outcome: Progressing Goal: Understanding of discharge needs will improve Outcome: Progressing   Problem: Activity: Goal: Ability to perform//tolerate increased activity and mobilize with assistive devices will improve Outcome: Progressing   Problem: Clinical Measurements: Goal: Postoperative complications will be avoided or minimized Outcome: Progressing   Problem: Self-Care: Goal: Ability to meet self-care needs will improve Outcome: Progressing   Problem: Self-Concept: Goal: Ability to maintain and perform role responsibilities to the fullest extent possible will improve Outcome: Progressing   Problem: Pain Management: Goal: Pain level will decrease with appropriate interventions Outcome: Progressing   Problem: Skin Integrity: Goal: Demonstration of wound healing without infection will improve Outcome: Progressing

## 2023-02-04 NOTE — Evaluation (Signed)
Occupational Therapy Evaluation Patient Details Name: Deborah [name] MRN: [id number] DOB: 06/14/1965 Today's Date: 02/04/2023   History of Present Illness Patient is a 57 y.o.  female with history of ESRD on HD MWF, PAF-not on any anticoagulation due to recent GI bleeding, HFpEF, DM-2, HTN, chronic pain syndrome on narcotics-who underwent BKA 10/23-presented with a right BKA stump dehiscence. Pt is now s/p 11/27 R AKA.   Clinical Impression   Pt initially without clothing on arrival (stated it as her preference) later was comfortable with putting on a gown. Pt s/p R AKA, educated her on exercises for BLEs in preparation to maintain enough strength for transfer and prosthetic use. Pt required total to setup assist for ADLs, more assist needed with LB ADLs. Pt seems close to baseline and has assist from PCAs during the week to assist with ADLs. Patient would benefit from post acute Home OT services to help maximize functional independence in natural environment       If plan is discharge home, recommend the following: Two people to help with walking and/or transfers;Two people to help with bathing/dressing/bathroom;Assistance with cooking/housework;Help with stairs or ramp for entrance;Assist for transportation;A lot of help with bathing/dressing/bathroom;A lot of help with walking and/or transfers    Functional Status Assessment  Patient has had a recent decline in their functional status and demonstrates the ability to make significant improvements in function in a reasonable and predictable amount of time.  Equipment Recommendations  None recommended by OT (defer to next level of care)    Recommendations for Other Services       Precautions / Restrictions Precautions Precautions: Fall Precaution Comments: sacral wounds, LLE wounds Restrictions Weight Bearing Restrictions: Yes RLE Weight Bearing: Non weight bearing Other Position/Activity Restrictions: R AKA      Mobility  Bed Mobility Overal bed mobility: Independent Bed Mobility: Rolling, Sidelying to Sit, Sit to Supine Rolling: Supervision, Used rails (rolling L, Min A to roll R) Sidelying to sit: Supervision, Used rails, HOB elevated Supine to sit: Supervision     General bed mobility comments: at EOB on entry    Transfers                   General transfer comment: Hoyer at baseline      Balance Overall balance assessment: Needs assistance Sitting-balance support: Feet supported Sitting balance-Leahy Scale: Fair Sitting balance - Comments: EOb sitting                                   ADL either performed or assessed with clinical judgement   ADL   Eating/Feeding: Set up;Sitting   Grooming: Set up;Sitting   Upper Body Bathing: Moderate assistance;Sitting   Lower Body Bathing: Total assistance;Bed level   Upper Body Dressing : Sitting;Moderate assistance (doffs gown without assist, needs more assist donning gowns)   Lower Body Dressing: Bed level;Total assistance Lower Body Dressing Details (indicate cue type and reason): don/doff L foot brace     Toileting- Clothing Manipulation and Hygiene: Bed level;Maximal assistance (bed pan reposition)               Vision         Perception         Praxis         Pertinent Vitals/Pain Pain Assessment Pain Assessment: Faces Faces Pain Scale: No hurt     Extremity/Trunk Assessment Upper Extremity Assessment Upper Extremity Assessment:  Generalized weakness LUE Deficits / Details: pt reprots bilat shoulder injuries, <10 degrees shoulder AROM noted during assessment. Elbow ROM WFL   Lower Extremity Assessment RLE Deficits / Details: pt reprots bilat shoulder injuries, <10 degrees shoulder AROM noted during assessment. Elbow ROM WFL LLE Deficits / Details: bandaged   Cervical / Trunk Assessment Cervical / Trunk Assessment: Other exceptions Cervical / Trunk Exceptions: large body habitus    Communication Communication Communication: No apparent difficulties Cueing Techniques: Verbal cues;Gestural cues   Cognition Arousal: Alert Behavior During Therapy: WFL for tasks assessed/performed Overall Cognitive Status: No family/caregiver present to determine baseline cognitive functioning                                 General Comments: questionable cog, very tangenital but follows commands appropriately.     General Comments  butt cream applied to sacral wound at patient's request    Exercises General Exercises - Lower Extremity Long Arc Quad: AROM, 10 reps, Seated, Both Hip ABduction/ADduction: Both, 10 reps, Seated, AROM Hip Flexion/Marching: AROM, 10 reps, Seated   Shoulder Instructions      Home Living Family/patient expects to be discharged to:: Private residence Living Arrangements: Spouse/significant other Available Help at Discharge: Personal care attendant;Available PRN/intermittently;Family (6 days per week, 2-3 hours per day) Type of Home: Apartment Home Access: Stairs to enter [employer] of Steps: 1 Entrance Stairs-Rails: Can reach both Home Layout: One level     Bathroom Shower/Tub: Chief Strategy Officer: Standard Bathroom Accessibility: Yes   Home Equipment: BSC/3in1;Shower seat;Wheelchair - Careers adviser (comment);None (hoyer lift)   Additional Comments: Pt states her husband helps her with all ADLs and uses a hoyer at baseline.      Prior Functioning/Environment               Mobility Comments: pt reports she has assist from husband to get in w/c using hoyer ADLs Comments: Has assist from aides for bathing and dressing        OT Problem List: Decreased strength;Impaired balance (sitting and/or standing);Impaired UE functional use;Obesity      OT Treatment/Interventions: Self-care/ADL training;Balance training;Therapeutic exercise;Therapeutic activities;Patient/family education;DME and/or AE  instruction    OT Goals(Current goals can be found in the care plan section) Acute Rehab OT Goals Patient Stated Goal: To get back to moving around in wheelchair, with hoyer or via pivots OT Goal Formulation: With patient Time For Goal Achievement: 02/18/23 Potential to Achieve Goals: Good  OT Frequency: Min 1X/week    Co-evaluation              AM-PAC OT "6 Clicks" Daily Activity     Outcome Measure Help from another person eating meals?: A Little Help from another person taking care of personal grooming?: A Little Help from another person toileting, which includes using toliet, bedpan, or urinal?: A Lot Help from another person bathing (including washing, rinsing, drying)?: A Lot Help from another person to put on and taking off regular upper body clothing?: A Lot Help from another person to put on and taking off regular lower body clothing?: Total 6 Click Score: 13   End of Session Nurse Communication: Need for lift equipment (hoyer user)  Activity Tolerance: Patient tolerated treatment well Patient left: in bed;with call bell/phone within reach  OT Visit Diagnosis: Muscle weakness (generalized) (M62.81);Other abnormalities of gait and mobility (R26.89);Unsteadiness on feet (R26.81)  Time: 1610-9604 OT Time Calculation (min): 32 min Charges:  OT General Charges $OT Visit: 1 Visit OT Evaluation $OT Eval Moderate Complexity: 1 Mod OT Treatments $Therapeutic Exercise: 8-22 mins  02/04/2023  AB, OTR/L  Acute Rehabilitation Services  Office: [phone]   Tristan Schroeder 02/04/2023, 2:10 PM

## 2023-02-04 NOTE — Progress Notes (Signed)
[facility] Progress Note   Subjective: patient seen and examined in room. Says she had a panic attack this morning when someone closed her door. She doesn't feel good and wants to do dialysis tomorrow.   Objective Vitals:   02/03/23 1944 02/04/23 0041 02/04/23 0513 02/04/23 0809  BP: (!) 116/54 128/64 128/62 137/72  Pulse:  67 67 71  Resp: 20 19 13 19   Temp: 98.1 F (36.7 C) (!) 97.4 F (36.3 C) (!) 97.2 F (36.2 C) 98 F (36.7 C)  TempSrc: Oral Oral Oral Oral  SpO2:  100% 100%   Weight:      Height:       Physical Exam General: Chronically ill appearing female in NAD Heart: S1,S2 RRR NO M/R/G Lungs: CTAB Abdomen: Obese NABS Extremities: R BKA wound vac in place Neuro: awake, alert Dialysis Access: [facility] drsg intact  Additional Objective Labs: Basic Metabolic Panel: Recent Labs  Lab 02/02/23 0458 02/03/23 0331 02/04/23 0446  NA 137 134* 138  K 3.2* 3.3* 3.7  CL 103 105 106  CO2 23 23 23   GLUCOSE 214* 358* 109*  BUN 11 19 24*  CREATININE 3.36* 3.92* 3.95*  CALCIUM 8.1* 8.3* 8.4*  PHOS 3.1 3.2 3.1   Liver Function Tests: Recent Labs  Lab 01/30/23 0434 01/31/23 0750 02/02/23 0458 02/03/23 0331 02/04/23 0446  AST 15  --   --   --   --   ALT 9  --   --   --   --   ALKPHOS 97  --   --   --   --   BILITOT 0.7  --   --   --   --   PROT 6.2*  --   --   --   --   ALBUMIN 2.0*   < > 1.9* 1.9* 1.8*   < > = values in this interval not displayed.   No results for input(s): "LIPASE", "AMYLASE" in the last 168 hours.  CBC: Recent Labs  Lab 01/29/23 1430 01/29/23 2320 01/30/23 0434 01/31/23 0750 01/31/23 2325 02/01/23 0433 02/02/23 0458 02/03/23 0331  WBC 18.7*   < > 15.2* 14.3*  --  16.7* 14.2* 14.8*  NEUTROABS 14.1*  --   --   --   --   --   --   --   HGB 7.8*   < > 7.4* 6.8*   < > 7.7* 9.2* 9.3*  HCT 25.7*   < > 24.1* 23.1*   < > 24.9* 29.9* 29.3*  MCV 96.3   < > 94.9 96.3  --  94.7 93.7 93.0  PLT 425*   < > 398 342  --  340 319  343   < > = values in this interval not displayed.   Blood Culture    Component Value Date/Time   SDES BLOOD RIGHT HAND 01/30/2023 0436   SPECREQUEST  01/30/2023 0436    BOTTLES DRAWN AEROBIC ONLY Blood Culture results may not be optimal due to an inadequate volume of blood received in culture bottles   CULT  01/30/2023 0436    NO GROWTH 4 DAYS Performed at [facility], [address] [place], [place] [address]    REPTSTATUS PENDING 01/30/2023 [phone]    Cardiac Enzymes: No results for input(s): "CKTOTAL", "CKMB", "CKMBINDEX", "TROPONINI" in the last 168 hours. CBG: Recent Labs  Lab 02/03/23 0204 02/03/23 0758 02/03/23 1538 02/03/23 2226 02/04/23 0808  GLUCAP 398* 236* 208* 190* 81  Iron Studies: No results for input(s): "IRON", "TIBC", "TRANSFERRIN", "FERRITIN" in the last 72 hours. @lablastinr3 @ Studies/Results: No results found. Medications:    sodium chloride   Intravenous Once   sodium chloride   Intravenous Once   vitamin C  1,000 mg Oral Daily   busPIRone  5 mg Oral TID   darbepoetin (ARANESP) injection - DIALYSIS  100 mcg Subcutaneous Q Sat-1800   ferric citrate  210 mg Oral TID WC   Gerhardt's butt cream   Topical TID   heparin  5,000 Units Subcutaneous Q8H   insulin aspart  0-6 Units Subcutaneous TID WC   metoprolol tartrate  50 mg Oral BID   midodrine  10 mg Oral TID WC   mirtazapine  15 mg Oral QHS   nutrition supplement (JUVEN)  1 packet Oral BID BM   pantoprazole  40 mg Oral BID   polyethylene glycol  17 g Oral BID   potassium chloride  40 mEq Oral Once   senna-docusate  2 tablet Oral QHS   sodium chloride flush  3 mL Intravenous Q12H   zinc sulfate (50mg  elemental zinc)  220 mg Oral Daily     Dialysis Orders:  MWF NW  4h  400/800  121kg   3K/2.5Ca bath   TDC   Heparin none - last OP HD 11/18, post wt 124.6kg  - has been usually reaching dry wt - missed 2 sessions in last 3 wks 11/20 and 11/13 - was in hospital 10/9- 11/04 here  -  dry wt lowered 6 kg 2 wks ago - venofer 100mg  q hd thru 12/04 - mircera 100 mcg q 2 wks, last 10/5, due 10/19    Assessment/Plan: Sepsis - presenting w/ SIRS, due to dehisced [name]. IV abx started and ortho consulted. Per pmd. S/p bka revision 11/27 ESKD - on HD MWF. Missed HD due to illness as above. S/p holiday sched. Pt request next dialysis tomorrow 11/30.  HTN - bp's stable/acceptable, takes metoprolol at home. Cont meds as needed.  Volume - no gross vol excess on exam. Anemia of eskd - hgb up to 9.3. Overdue for esa, started darbe here at 100 mcg weekly. Tranfuse prn.  MBD ckd - CCa in range. Phos at goal. Continue binders.  DM2- per primary  Tomasa Blase PA-C [facility] 02/04/2023,8:45 AM

## 2023-02-04 NOTE — Plan of Care (Signed)
  Problem: Education: Goal: Knowledge of General Education information will improve Description: Including pain rating scale, medication(s)/side effects and non-pharmacologic comfort measures Outcome: Progressing   Problem: Health Behavior/Discharge Planning: Goal: Ability to manage health-related needs will improve Outcome: Progressing   Problem: Clinical Measurements: Goal: Ability to maintain clinical measurements within normal limits will improve Outcome: Progressing Goal: Diagnostic test results will improve Outcome: Progressing Goal: Respiratory complications will improve Outcome: Progressing   Problem: Coping: Goal: Level of anxiety will decrease Outcome: Progressing   Problem: Pain Management: Goal: General experience of comfort will improve Outcome: Progressing   Problem: Coping: Goal: Ability to adjust to condition or change in health will improve Outcome: Progressing   Problem: Skin Integrity: Goal: Risk for impaired skin integrity will decrease Outcome: Progressing

## 2023-02-04 NOTE — Anesthesia Postprocedure Evaluation (Signed)
Anesthesia Post Note  Patient: [name]  Procedure(s) Performed: REVISION RIGHT BELOW KNEE AMPUTATION (Right: Knee)     Patient location during evaluation: PACU Anesthesia Type: General Level of consciousness: awake Pain management: pain level controlled Vital Signs Assessment: post-procedure vital signs reviewed and stable Respiratory status: spontaneous breathing Cardiovascular status: stable Postop Assessment: no apparent nausea or vomiting Anesthetic complications: no  There were no known notable events for this encounter.  Last Vitals:  Vitals:   02/04/23 0513 02/04/23 0809  BP: 128/62 137/72  Pulse: 67 71  Resp: 13 19  Temp: (!) 36.2 C 36.7 C  SpO2: 100%     Last Pain:  Vitals:   02/04/23 1044  TempSrc:   PainSc: 0-No pain                 Caren Macadam

## 2023-02-05 DIAGNOSIS — T8781 Dehiscence of amputation stump: Secondary | ICD-10-CM | POA: Diagnosis not present

## 2023-02-05 LAB — RENAL FUNCTION PANEL
Albumin: 2.3 g/dL — ABNORMAL LOW (ref 3.5–5.0)
Anion gap: 12 (ref 5–15)
BUN: 31 mg/dL — ABNORMAL HIGH (ref 6–20)
CO2: 19 mmol/L — ABNORMAL LOW (ref 22–32)
Calcium: 8.8 mg/dL — ABNORMAL LOW (ref 8.9–10.3)
Chloride: 104 mmol/L (ref 98–111)
Creatinine, Ser: 5.17 mg/dL — ABNORMAL HIGH (ref 0.44–1.00)
GFR, Estimated: 9 mL/min — ABNORMAL LOW (ref >60)
Glucose, Bld: 148 mg/dL — ABNORMAL HIGH (ref 70–99)
Phosphorus: 3.8 mg/dL (ref 2.5–4.6)
Potassium: 4 mmol/L (ref 3.5–5.1)
Sodium: 135 mmol/L (ref 135–145)

## 2023-02-05 LAB — CBC
HCT: 32.2 % — ABNORMAL LOW (ref 36.0–46.0)
Hemoglobin: 9.9 g/dL — ABNORMAL LOW (ref 12.0–15.0)
MCH: 28.8 pg (ref 26.0–34.0)
MCHC: 30.7 g/dL (ref 30.0–36.0)
MCV: 93.6 fL (ref 80.0–100.0)
Platelets: 379 10*3/uL (ref 150–400)
RBC: 3.44 MIL/uL — ABNORMAL LOW (ref 3.87–5.11)
RDW: 15.4 % (ref 11.5–15.5)
WBC: 17.2 10*3/uL — ABNORMAL HIGH (ref 4.0–10.5)
nRBC: 0 % (ref 0.0–0.2)

## 2023-02-05 LAB — GLUCOSE, CAPILLARY
Glucose-Capillary: 136 mg/dL — ABNORMAL HIGH (ref 70–99)
Glucose-Capillary: 133 mg/dL — ABNORMAL HIGH (ref 70–99)
Glucose-Capillary: 191 mg/dL — ABNORMAL HIGH (ref 70–99)
Glucose-Capillary: 173 mg/dL — ABNORMAL HIGH (ref 70–99)

## 2023-02-05 MED ORDER — SODIUM CHLORIDE 0.9 % IV SOLN
2.0000 g | INTRAVENOUS | Status: DC
  Administered 2023-02-05 – 2023-02-07 (×3): 2 g via INTRAVENOUS
  Filled 2023-02-05 (×3): qty 20

## 2023-02-05 MED ORDER — VANCOMYCIN HCL IN DEXTROSE 1-5 GM/200ML-% IV SOLN: 1000.0000 mg | INTRAVENOUS | Status: DC

## 2023-02-05 MED ORDER — HYDROXYZINE HCL 25 MG PO TABS
50.0000 mg | ORAL_TABLET | Freq: Three times a day (TID) | ORAL | Status: DC | PRN
  Administered 2023-02-05 – 2023-02-07 (×5): 50 mg via ORAL
  Filled 2023-02-05 (×5): qty 2

## 2023-02-05 MED ORDER — ORAL CARE MOUTH RINSE: 15.0000 mL | OROMUCOSAL | Status: DC | PRN

## 2023-02-05 MED ORDER — VANCOMYCIN HCL IN DEXTROSE 1-5 GM/200ML-% IV SOLN
1000.0000 mg | Freq: Once | INTRAVENOUS | Status: AC
  Administered 2023-02-05: 1000 mg via INTRAVENOUS
  Filled 2023-02-05: qty 200

## 2023-02-05 MED ORDER — HEPARIN SODIUM (PORCINE) 1000 UNIT/ML IJ SOLN
INTRAMUSCULAR | Status: AC
  Administered 2023-02-05: 1000 [IU]
  Filled 2023-02-05: qty 4

## 2023-02-05 MED ORDER — HYDROMORPHONE HCL 1 MG/ML IJ SOLN
1.0000 mg | Freq: Once | INTRAMUSCULAR | Status: AC
  Administered 2023-02-05: 1 mg via INTRAVENOUS
  Filled 2023-02-05: qty 1

## 2023-02-05 NOTE — Progress Notes (Addendum)
PROGRESS NOTE  Deborah [name] UVO:[id number] DOB: Apr 29, 1965 DOA: 01/29/2023 PCP: Pcp, No   LOS: 7 days   Brief Narrative / Interim history: Patient is a 57 y.o.  female with history of ESRD on HD MWF, PAF-not on any anticoagulation due to recent GI bleeding, HFpEF, DM-2, HTN, chronic pain syndrome on narcotics-who underwent BKA 10/23-presented with a right BKA stump dehiscence.   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 Lajoyce Corners 11/27.  Continue wound VAC  Active problems Left heel ulcer -MRI done last month showed early osteomyelitis.  Spoke with Dr. Lajoyce Corners, did not receive antibiotics as he was felt to have an open wound and usually these are indolent and he is planning a stage approach with eventual surgery on the left as well -Due to rise in the white count this morning, will reimage the left heel and Dr. Lajoyce Corners will evaluate patient as well  ESRD on HD-nephrology consulted  Hypokalemia-replace as indicated, continue to monitor  Generalized body tremors /jerking movement-on 11/25, no recurrence since then.  Neurology briefly evaluated, appears to be functional  History of GI bleed October 2024-thought to be due to AVMs based on the EGD.  No longer an issue, hemoglobin overall stable  History of [place]-supportive care, she would benefit from weight loss  PAF-not on anticoagulation due to recent GI bleed.  Continue metoprolol  Remdesivir CHF-volume management per HD  Obesity, morbid-BMI 40.3.  She would benefit  from goals  Chronic pain syndrome-continue pain regimen as is.  Avoid increasing narcotics  Depression/anxiety/panic attacks-continue hydroxyzine, BuSpar  History of tardive dyskinesia-supportive care  History of malignant duodenal carcinoid-status postresection in the past  GOC -patient did mention to me this morning that she is thinking about stopping dialysis as her quality of life is quite poor.  She mentioned the same to Dr. Thedore Mins with nephrology and she is thinking about hospice, but for now wants to continue full medical treatment  DM2 -controlled  Lab Results  Component Value Date   HGBA1C 5.7 (H) 02/04/2023   CBG (last 3)  Recent Labs    02/04/23 2126 02/05/23 0645 02/05/23 0822  GLUCAP 128* 136* 133*    Scheduled Meds:  sodium chloride   Intravenous Once   sodium chloride   Intravenous Once   vitamin C  1,000 mg Oral Daily   busPIRone  5 mg Oral TID   darbepoetin (ARANESP) injection - DIALYSIS  100 mcg Subcutaneous Q Sat-1800   ferric citrate  210 mg Oral TID WC   Gerhardt's butt cream   Topical TID   heparin  5,000 Units Subcutaneous Q8H   insulin aspart  0-9 Units Subcutaneous TID WC   metoprolol tartrate  50 mg Oral BID   midodrine  10 mg Oral TID WC   mirtazapine  15 mg Oral QHS   nutrition supplement (JUVEN)  1 packet Oral BID BM   pantoprazole  40 mg Oral BID   polyethylene glycol  17 g Oral BID   potassium chloride  40 mEq Oral Once   senna-docusate  2 tablet Oral  QHS   sodium chloride flush  3 mL Intravenous Q12H   zinc sulfate (50mg  elemental zinc)  220 mg Oral Daily   Continuous Infusions: PRN Meds:.acetaminophen, bisacodyl, guaiFENesin-dextromethorphan, hydrALAZINE, HYDROmorphone (DILAUDID) injection, hydrOXYzine, labetalol, loperamide, LORazepam, methocarbamol, metoprolol tartrate, ondansetron **OR** ondansetron (ZOFRAN) IV, mouth rinse, phenol, sodium chloride flush  Current Outpatient Medications  Medication Instructions   albuterol  (PROVENTIL) 2.5 mg, Nebulization, Every 6 hours PRN   allopurinol (ZYLOPRIM) 100 mg, Oral, 2 times daily   Amino Acids-Protein Hydrolys (FEEDING SUPPLEMENT, PRO-STAT SUGAR FREE 64,) LIQD 30 mLs, Oral, 2 times daily between meals   ascorbic acid (VITAMIN C) 500 mg, Oral, Daily   atorvastatin (LIPITOR) 10 mg, Oral, Every evening   busPIRone (BUSPAR) 5 mg, Oral, 3 times daily   cefdinir (OMNICEF) 300 mg, Oral, Daily   cetirizine (ZYRTEC) 10 mg, Oral, Daily   colchicine 0.3 mg, Oral, 2 times weekly   famotidine (PEPCID) 20 mg, Oral, Daily before breakfast   ferric citrate (AURYXIA) 420 mg, Oral, 3 times daily with meals   fluticasone (FLONASE) 50 MCG/ACT nasal spray 2 sprays, Each Nare, Daily PRN   folic acid (FOLVITE) 1 mg, Oral, Daily   gabapentin (NEURONTIN) 100 mg, Oral, 3 times daily   HYDROmorphone (DILAUDID) 4 mg, Oral, 4 times daily   hydrOXYzine (ATARAX) 25 mg, Oral, 3 times daily PRN   insulin lispro (HUMALOG) 100 UNIT/ML KwikPen Before each meal 3 times a day, 140-199 - 2 units, 200-250 - 6 units, 251-299 - 8 units,  300-349 - 12 units,  350 or above 14 units.   loperamide (IMODIUM) 2 mg, Oral, As needed   methocarbamol (ROBAXIN) 500 mg, Oral, Every 6 hours PRN   metoprolol tartrate (LOPRESSOR) 50 mg, Oral, 2 times daily   mirtazapine (REMERON) 15 mg, Oral, Daily at bedtime   Multiple Vitamins-Minerals (SENIOR MULTIVITAMIN PLUS PO) 1 tablet, Oral, Daily   nutrition supplement, JUVEN, (JUVEN) PACK 1 packet, Oral, 2 times daily between meals   Nutritional Supplement LIQD 120-237 mLs, Oral, See admin instructions, House supplement. Give 120 mL in the morning at 0900 and 237 mL twice daily between meals (1000, 1400)   Nutritional Supplements (,FEEDING SUPPLEMENT, PROSOURCE PLUS) liquid 30 mLs, Oral, 2 times daily between meals   pantoprazole (PROTONIX) 40 mg, Oral, 2 times daily   Semglee (yfgn) 8 Units, Subcutaneous, Daily at bedtime   zinc sulfate (50mg  elemental zinc) 220 mg, Oral,  Daily    Diet Orders (From admission, onward)     Start     Ordered   02/02/23 1300  Diet Carb Modified Fluid consistency: Thin; Room service appropriate? Yes  Diet effective now       Question Answer Comment  Diet-HS Snack? Nothing   Calorie Level Medium 1600-2000   Fluid consistency: Thin   Room service appropriate? Yes      02/02/23 1300            DVT prophylaxis: SCD's Start: 02/02/23 1640 heparin injection 5,000 Units Start: 01/29/23 2200   Lab Results  Component Value Date   PLT 379 02/05/2023      Code Status: Full Code  Family Communication: Significant other at bedside  Status is: Inpatient Remains inpatient appropriate because: Severity of illness  Level of care: Progressive  Consultants:  Nephrology Orthopedic surgery  Objective: Vitals:   02/05/23 0400 02/05/23 0500 02/05/23 0600 02/05/23 0821  BP:    103/79  Pulse:    76  Resp: 19 19 20 15   Temp:  97.6 F (36.4 C)  TempSrc:    Oral  SpO2:      Weight:      Height:        Intake/Output Summary (Last 24 hours) at 02/05/2023 0955 Last data filed at 02/04/2023 2243 Gross per 24 hour  Intake 10 ml  Output --  Net 10 ml   Wt Readings from Last 3 Encounters:  02/02/23 113.4 kg  01/08/23 121.2 kg  11/28/22 127.5 kg    Examination:  Constitutional: NAD Eyes: no scleral icterus ENMT: Mucous membranes are moist.  Neck: normal, supple Respiratory: clear to auscultation bilaterally, no wheezing, no crackles. Normal respiratory effort.  Cardiovascular: Regular rate and rhythm, no murmurs / rubs / gallops. No LE edema.  Abdomen: non distended, no tenderness. Bowel sounds positive.  Musculoskeletal: no clubbing / cyanosis.   Data Reviewed: I have independently reviewed following labs and imaging studies   CBC Recent Labs  Lab 01/29/23 1430 01/29/23 2320 01/31/23 0750 01/31/23 2325 02/01/23 0433 02/02/23 0458 02/03/23 0331 02/05/23 0541  WBC 18.7*   < > 14.3*  --  16.7*  14.2* 14.8* 17.2*  HGB 7.8*   < > 6.8* 7.6* 7.7* 9.2* 9.3* 9.9*  HCT 25.7*   < > 23.1* 24.8* 24.9* 29.9* 29.3* 32.2*  PLT 425*   < > 342  --  340 319 343 379  MCV 96.3   < > 96.3  --  94.7 93.7 93.0 93.6  MCH 29.2   < > 28.3  --  29.3 28.8 29.5 28.8  MCHC 30.4   < > 29.4*  --  30.9 30.8 31.7 30.7  RDW 15.2   < > 15.5  --  16.0* 15.7* 15.2 15.4  LYMPHSABS 3.0  --   --   --   --   --   --   --   MONOABS 0.9  --   --   --   --   --   --   --   EOSABS 0.4  --   --   --   --   --   --   --   BASOSABS 0.1  --   --   --   --   --   --   --    < > = values in this interval not displayed.    Recent Labs  Lab 01/29/23 1430 01/29/23 1450 01/29/23 2320 01/30/23 0434 01/30/23 0543 01/31/23 0750 02/01/23 0433 02/02/23 0458 02/03/23 0331 02/04/23 0446 02/04/23 1955 02/05/23 0541  NA 141  --   --  139  --    < > 137 137 134* 138  --  135  K 2.8*  --   --  3.4*  --    < > 3.3* 3.2* 3.3* 3.7  --  4.0  CL 107  --   --  107  --    < > 104 103 105 106  --  104  CO2 23  --   --  21*  --    < > 23 23 23 23   --  19*  GLUCOSE 138*  --   --  103*  --    < > 171* 214* 358* 109*  --  148*  BUN 16  --   --  17  --    < > 15 11 19  24*  --  31*  CREATININE 6.45*  --    < > 5.91*  --    < >  4.71* 3.36* 3.92* 3.95*  --  5.17*  CALCIUM 8.2*  --   --  7.9*  --    < > 7.9* 8.1* 8.3* 8.4*  --  8.8*  AST  --   --   --  15  --   --   --   --   --   --   --   --   ALT  --   --   --  9  --   --   --   --   --   --   --   --   ALKPHOS  --   --   --  97  --   --   --   --   --   --   --   --   BILITOT  --   --   --  0.7  --   --   --   --   --   --   --   --   ALBUMIN  --   --   --  2.0*  --    < > 1.7* 1.9* 1.9* 1.8*  --  2.3*  CRP 4.1*  --   --   --   --   --   --   --   --   --   --   --   LATICACIDVEN  --  2.0*  --   --  1.6  --   --   --   --   --   --   --   HGBA1C  --   --   --   --   --   --   --   --   --   --  5.7*  --    < > = values in this interval not displayed.     ------------------------------------------------------------------------------------------------------------------ No results for input(s): "CHOL", "HDL", "LDLCALC", "TRIG", "CHOLHDL", "LDLDIRECT" in the last 72 hours.  Lab Results  Component Value Date   HGBA1C 5.7 (H) 02/04/2023   ------------------------------------------------------------------------------------------------------------------ No results for input(s): "TSH", "T4TOTAL", "T3FREE", "THYROIDAB" in the last 72 hours.  Invalid input(s): "FREET3"  Cardiac Enzymes No results for input(s): "CKMB", "TROPONINI", "MYOGLOBIN" in the last 168 hours.  Invalid input(s): "CK" ------------------------------------------------------------------------------------------------------------------    Component Value Date/Time   BNP 368.9 (H) 12/14/2022 1700    CBG: Recent Labs  Lab 02/04/23 1231 02/04/23 1620 02/04/23 2126 02/05/23 0645 02/05/23 0822  GLUCAP 92 106* 128* 136* 133*    Recent Results (from the past 240 hour(s))  Blood culture (routine x 2)     Status: None   Collection Time: 01/29/23  2:19 PM   Specimen: BLOOD  Result Value Ref Range Status   Specimen Description BLOOD SITE NOT SPECIFIED  Final   Special Requests   Final    BOTTLES DRAWN AEROBIC AND ANAEROBIC Blood Culture adequate volume   Culture   Final    NO GROWTH 5 DAYS Performed at [facility], [address], [place], [place] [address]    Report Status 02/03/2023 FINAL  Final  Resp panel by RT-PCR (RSV, Flu A&B, Covid) Anterior Nasal Swab     Status: None   Collection Time: 01/29/23  3:05 PM   Specimen: Anterior Nasal Swab  Result Value Ref Range Status   SARS Coronavirus 2 by RT PCR NEGATIVE NEGATIVE Final   Influenza A by PCR NEGATIVE NEGATIVE Final   Influenza B by PCR  NEGATIVE NEGATIVE Final    Comment: (NOTE) The Xpert Xpress SARS-CoV-2/FLU/RSV plus assay is intended as an aid in the diagnosis of influenza from Nasopharyngeal  swab specimens and should not be used as a sole basis for treatment. Nasal washings and aspirates are unacceptable for Xpert Xpress SARS-CoV-2/FLU/RSV testing.  Fact Sheet for Patients: BloggerCourse.com  Fact Sheet for Healthcare Providers: SeriousBroker.it  This test is not yet approved or cleared by the [place] FDA and has been authorized for detection and/or diagnosis of SARS-CoV-2 by FDA under an Emergency Use Authorization (EUA). This EUA will remain in effect (meaning this test can be used) for the duration of the COVID-19 declaration under Section 564(b)(1) of the Act, 21 U.S.C. section 360bbb-3(b)(1), unless the authorization is terminated or revoked.     Resp Syncytial Virus by PCR NEGATIVE NEGATIVE Final    Comment: (NOTE) Fact Sheet for Patients: BloggerCourse.com  Fact Sheet for Healthcare Providers: SeriousBroker.it  This test is not yet approved or cleared by the [place] FDA and has been authorized for detection and/or diagnosis of SARS-CoV-2 by FDA under an Emergency Use Authorization (EUA). This EUA will remain in effect (meaning this test can be used) for the duration of the COVID-19 declaration under Section 564(b)(1) of the Act, 21 U.S.C. section 360bbb-3(b)(1), unless the authorization is terminated or revoked.  Performed at [facility], [address], [place], [place] [address]   Blood culture (routine x 2)     Status: None   Collection Time: 01/30/23  4:36 AM   Specimen: BLOOD RIGHT HAND  Result Value Ref Range Status   Specimen Description BLOOD RIGHT HAND  Final   Special Requests   Final    BOTTLES DRAWN AEROBIC ONLY Blood Culture results may not be optimal due to an inadequate volume of blood received in culture bottles   Culture   Final    NO GROWTH 5 DAYS Performed at [facility], [address] [place], [place]  [address]    Report Status 02/04/2023 FINAL  Final  MRSA Next Gen by PCR, Nasal     Status: None   Collection Time: 02/01/23 10:57 PM   Specimen: Nasal Mucosa; Nasal Swab  Result Value Ref Range Status   MRSA by PCR Next Gen NOT DETECTED NOT DETECTED Final    Comment: (NOTE) The GeneXpert MRSA Assay (FDA approved for NASAL specimens only), is one component of a comprehensive MRSA colonization surveillance program. It is not intended to diagnose MRSA infection nor to guide or monitor treatment for MRSA infections. Test performance is not FDA approved in patients less than 81 years old. Performed at [facility], [address] [place], [place] [address]      Radiology Studies: No results found.   Pamella Pert, MD, PhD [facility]  Between 7 am - 7 pm I am available, please contact me via Amion (for emergencies) or Securechat (non urgent messages)  Between 7 pm - 7 am I am not available, please contact night coverage MD/APP via Amion

## 2023-02-05 NOTE — Progress Notes (Signed)
Spoke with pt nurse from the floor transport went to get the pt and she refused to come to dialysis.

## 2023-02-05 NOTE — Progress Notes (Signed)
   02/05/23 1759  Vitals  Temp 98.8 F (37.1 C)  Pulse Rate 89  Resp 18  BP 116/68  SpO2 99 %  O2 Device Room Air  Weight 125.1 kg  Oxygen Therapy  Patient Activity (if Appropriate) In bed  Pulse Oximetry Type Continuous  Post Treatment  Dialyzer Clearance Lightly streaked  Liters Processed 50.1  Fluid Removed (mL) 700 mL  Tolerated HD Treatment No (Comment) (hypotensive episodes required UF pause. Then c/o pain severe enough to end tx)  Post-Hemodialysis Comments Tx terminated after 2:05, per pt request due to pain. MD notified   Received patient in bed to unit.  Alert and oriented.  Informed consent signed and in chart.   TX duration: 2:05  Patient did not tolerated well due to hypotension and pain.  Transported back to the room  Alert, tearful, anxious.  Hand-off given to patient's nurse.   Access used: L CVC Access issues: none  Total UF removed: 0.7L Medication(s) given: midodrine 10 mg

## 2023-02-05 NOTE — Progress Notes (Signed)
[facility] Progress Note   Subjective: patient seen and examined in room. Had another panic attack and refused dialysis this am. Agrees to dialysis later today.   Objective Vitals:   02/05/23 0400 02/05/23 0500 02/05/23 0600 02/05/23 0821  BP:    103/79  Pulse:    76  Resp: 19 19 20 15   Temp:    97.6 F (36.4 C)  TempSrc:    Oral  SpO2:      Weight:      Height:       Physical Exam General: Chronically ill appearing female, tearful this am  Heart:  RRR NO M/R/G Lungs: CTAB Abdomen: Obese NABS Extremities: R BKA wound vac in place Neuro: awake, alert Dialysis Access: RIJ [facility] drsg intact  Additional Objective Labs: Basic Metabolic Panel: Recent Labs  Lab 02/03/23 0331 02/04/23 0446 02/05/23 0541  NA 134* 138 135  K 3.3* 3.7 4.0  CL 105 106 104  CO2 23 23 19*  GLUCOSE 358* 109* 148*  BUN 19 24* 31*  CREATININE 3.92* 3.95* 5.17*  CALCIUM 8.3* 8.4* 8.8*  PHOS 3.2 3.1 3.8   Liver Function Tests: Recent Labs  Lab 01/30/23 0434 01/31/23 0750 02/03/23 0331 02/04/23 0446 02/05/23 0541  AST 15  --   --   --   --   ALT 9  --   --   --   --   ALKPHOS 97  --   --   --   --   BILITOT 0.7  --   --   --   --   PROT 6.2*  --   --   --   --   ALBUMIN 2.0*   < > 1.9* 1.8* 2.3*   < > = values in this interval not displayed.   No results for input(s): "LIPASE", "AMYLASE" in the last 168 hours.  CBC: Recent Labs  Lab 01/29/23 1430 01/29/23 2320 01/31/23 0750 01/31/23 2325 02/01/23 0433 02/02/23 0458 02/03/23 0331 02/05/23 0541  WBC 18.7*   < > 14.3*  --  16.7* 14.2* 14.8* 17.2*  NEUTROABS 14.1*  --   --   --   --   --   --   --   HGB 7.8*   < > 6.8*   < > 7.7* 9.2* 9.3* 9.9*  HCT 25.7*   < > 23.1*   < > 24.9* 29.9* 29.3* 32.2*  MCV 96.3   < > 96.3  --  94.7 93.7 93.0 93.6  PLT 425*   < > 342  --  340 319 343 379   < > = values in this interval not displayed.   Blood Culture    Component Value Date/Time   SDES BLOOD RIGHT HAND 01/30/2023  0436   SPECREQUEST  01/30/2023 0436    BOTTLES DRAWN AEROBIC ONLY Blood Culture results may not be optimal due to an inadequate volume of blood received in culture bottles   CULT  01/30/2023 0436    NO GROWTH 5 DAYS Performed at [facility], [address], [place], [place] [address]    REPTSTATUS 02/04/2023 FINAL 01/30/2023 0436    Cardiac Enzymes: No results for input(s): "CKTOTAL", "CKMB", "CKMBINDEX", "TROPONINI" in the last 168 hours. CBG: Recent Labs  Lab 02/04/23 1231 02/04/23 1620 02/04/23 2126 02/05/23 0645 02/05/23 0822  GLUCAP 92 106* 128* 136* 133*   Iron Studies: No results for input(s): "IRON", "TIBC", "TRANSFERRIN", "FERRITIN" in the last 72 hours. @lablastinr3 @ Studies/Results: No  results found. Medications:    sodium chloride   Intravenous Once   sodium chloride   Intravenous Once   vitamin C  1,000 mg Oral Daily   busPIRone  5 mg Oral TID   darbepoetin (ARANESP) injection - DIALYSIS  100 mcg Subcutaneous Q Sat-1800   ferric citrate  210 mg Oral TID WC   Gerhardt's butt cream   Topical TID   heparin  5,000 Units Subcutaneous Q8H   insulin aspart  0-9 Units Subcutaneous TID WC   metoprolol tartrate  50 mg Oral BID   midodrine  10 mg Oral TID WC   mirtazapine  15 mg Oral QHS   nutrition supplement (JUVEN)  1 packet Oral BID BM   pantoprazole  40 mg Oral BID   polyethylene glycol  17 g Oral BID   potassium chloride  40 mEq Oral Once   senna-docusate  2 tablet Oral QHS   sodium chloride flush  3 mL Intravenous Q12H   zinc sulfate (50mg  elemental zinc)  220 mg Oral Daily     Dialysis Orders:  MWF NW  4h  400/800  121kg   3K/2.5Ca bath   TDC   Heparin none - last OP HD 11/18, post wt 124.6kg  - has been usually reaching dry wt - missed 2 sessions in last 3 wks 11/20 and 11/13 - was in hospital 10/9- 11/04 here  - dry wt lowered 6 kg 2 wks ago - venofer 100mg  q hd thru 12/04 - mircera 100 mcg q 2 wks, last 10/5, due 10/19     Assessment/Plan: Sepsis - presenting w/ SIRS, due to dehisced [name]. IV abx started and ortho consulted. Per pmd. S/p bka revision 11/27 ESKD - on HD MWF. . S/p holiday sched. Last HD 11/26.  Asked to reschedule dialysis Friday.  Declined to go this am. Will try again for afternoon shift.  HTN - bp's stable/acceptable, takes metoprolol at home. Cont meds as needed.  Volume - no gross vol excess on exam. Anemia of eskd - hgb up to 9.9 Overdue for esa, started darbe here at 100 mcg weekly. Tranfuse prn.  MBD ckd - CCa in range. Phos at goal. Continue binders.  DM2- per primary  Tomasa Blase PA-C [facility] 02/05/2023,9:43 AM

## 2023-02-05 NOTE — Plan of Care (Addendum)
Patient remains on MC-5W at time of writing. 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 Upon change of shift, the wound vac is unplugged and not turned; this was immediately addressed by this RN. NPWT operated immediately and without complication. Also, the falls risk bundle was implemented by this RN overnight (was not previously in place although the patient is ow s/p BKA this admission). Patient is requesting dilaudid around-the-clock. The patient also calls out frequently for tedious requests; bundling of requests is encouraged by nursing staff. Plan is for HD on 02/05/23. No CHG baths ordered as patient has documented allergy to CHG.  Edit: Patient declined to travel for HD this AM, reportedly related to her anxiety (see MAR for interventions). Hemo Nurse Candice was updated of the change. Hemo RN states they may/may not be able to facilitate treating the patient later today, and this was explained to the patient by this RN. Patient persists she does not want to travel to HD this AM even if there are no guarantees she can be treated later today. Nursing handoff given to [place], [place].   Problem: Education: Goal: Knowledge of General Education information will improve Description: Including pain rating scale, medication(s)/side effects and non-pharmacologic comfort measures Outcome: Progressing   Problem: Health Behavior/Discharge Planning: Goal: Ability to manage health-related needs will improve Outcome: Progressing   Problem: Clinical Measurements: Goal: Ability to maintain clinical measurements within normal limits will improve Outcome: Progressing Goal: Will remain free from infection Outcome: Progressing Goal: Diagnostic test results will improve Outcome: Progressing Goal: Respiratory complications will improve Outcome: Progressing Goal: Cardiovascular complication will be avoided Outcome: Progressing   Problem: Activity: Goal: Risk for activity intolerance will  decrease Outcome: Progressing   Problem: Nutrition: Goal: Adequate nutrition will be maintained Outcome: Progressing   Problem: Coping: Goal: Level of anxiety will decrease Outcome: Progressing   Problem: Elimination: Goal: Will not experience complications related to bowel motility Outcome: Progressing Goal: Will not experience complications related to urinary retention Outcome: Progressing   Problem: Pain Management: Goal: General experience of comfort will improve Outcome: Progressing   Problem: Safety: Goal: Ability to remain free from injury will improve Outcome: Progressing   Problem: Skin Integrity: Goal: Risk for impaired skin integrity will decrease Outcome: Progressing   Problem: Education: Goal: Ability to describe self-care measures that may prevent or decrease complications (Diabetes Survival Skills Education) will improve Outcome: Progressing Goal: Individualized Educational Video(s) Outcome: Progressing   Problem: Coping: Goal: Ability to adjust to condition or change in health will improve Outcome: Progressing   Problem: Fluid Volume: Goal: Ability to maintain a balanced intake and output will improve Outcome: Progressing   Problem: Health Behavior/Discharge Planning: Goal: Ability to identify and utilize available resources and services will improve Outcome: Progressing Goal: Ability to manage health-related needs will improve Outcome: Progressing   Problem: Metabolic: Goal: Ability to maintain appropriate glucose levels will improve Outcome: Progressing   Problem: Nutritional: Goal: Maintenance of adequate nutrition will improve Outcome: Progressing Goal: Progress toward achieving an optimal weight will improve Outcome: Progressing   Problem: Skin Integrity: Goal: Risk for impaired skin integrity will decrease Outcome: Progressing   Problem: Tissue Perfusion: Goal: Adequacy of tissue perfusion will improve Outcome: Progressing    Problem: Education: Goal: Knowledge of the prescribed therapeutic regimen will improve Outcome: Progressing Goal: Ability to verbalize activity precautions or restrictions will improve Outcome: Progressing Goal: Understanding of discharge needs will improve Outcome: Progressing   Problem: Activity: Goal: Ability to perform//tolerate increased activity and mobilize  with assistive devices will improve Outcome: Progressing   Problem: Clinical Measurements: Goal: Postoperative complications will be avoided or minimized Outcome: Progressing   Problem: Self-Care: Goal: Ability to meet self-care needs will improve Outcome: Progressing   Problem: Self-Concept: Goal: Ability to maintain and perform role responsibilities to the fullest extent possible will improve Outcome: Progressing   Problem: Pain Management: Goal: Pain level will decrease with appropriate interventions Outcome: Progressing   Problem: Skin Integrity: Goal: Demonstration of wound healing without infection will improve Outcome: Progressing

## 2023-02-05 NOTE — Progress Notes (Addendum)
Pharmacy Antibiotic Note  Deborah [name] is a 57 y.o. female admitted on 01/29/2023 with  left lower extremity osteomyelitis .  Pharmacy has been consulted for vancomycin dosing.  Patient was on IV antibiotics through 11/28 then stopped, now restarting per MD.   Patient was recently seen for a follow up appointment for her right below knee amputation with concern for stump infection. Patient also has non-healing would on left lower extremity with concern for osteomyelitis on repeat imaging. Patient has ESRD with a MWF schedule. Due to missing yesterday's session, will plan for a session today per nephrology.   Plan: Start Vancomycin 1000 mg IV after each full hemodialysis session.  Ceftriaxone 2g once daily per MD  Monitor WBC, fever, renal function, cultures De-escalate when able F/u Nephrology plan  Height: 5\' 6"  (167.6 cm) Weight: 113.4 kg (250 lb) IBW/kg (Calculated) : 59.3  Temp (24hrs), Avg:97.7 F (36.5 C), Min:97.3 F (36.3 C), Max:98.1 F (36.7 C)  Recent Labs  Lab 01/29/23 1450 01/29/23 2320 01/30/23 0543 01/31/23 0750 02/01/23 0433 02/02/23 0458 02/03/23 0331 02/04/23 0446 02/05/23 0541  WBC  --    < >  --  14.3* 16.7* 14.2* 14.8*  --  17.2*  CREATININE  --    < >  --  4.09* 4.71* 3.36* 3.92* 3.95* 5.17*  LATICACIDVEN 2.0*  --  1.6  --   --   --   --   --   --    < > = values in this interval not displayed.    Estimated Creatinine Clearance: 15.1 mL/min (A) (by C-G formula based on SCr of 5.17 mg/dL (H)).    Allergies  Allergen Reactions   Isovue [Iopamidol] Anaphylaxis, [name] and Other (See Comments)    11/28/17 Patient had seizure like activity and then 1 min code after 100 cc of isovue 300. Possible contrast allergy vs vasovagal episode  Cardiac Arrest   Neurontin [Gabapentin] [name] and Swelling   Nsaids Anaphylaxis and Other (See Comments)    Hx of stomach ulcers   Penicillins Itching, Palpitations and Other (See  Comments)    Flushing (Red Skin) Laryngeal Edema   Reglan [Metoclopramide] Other (See Comments)    Tardive dyskinesia    Valium [Diazepam] [name]   Zestril [Lisinopril] Anaphylaxis and Swelling    Tongue and mouth swelling Laryngeal Edema   Tolectin [Tolmetin] Nausea And Vomiting, Nausea Only and Other (See Comments)    Irritates stomach ulcer   Asa [Aspirin] Other (See Comments)    Hx of stomach ulcer   Aspartame And Phenylalanine Hives   Bentyl [Dicyclomine] Other (See Comments)    Chest pain   Hibiclens [Chlorhexidine Gluconate] Other (See Comments)    Dermatitis    Flexeril [Cyclobenzaprine] Palpitations   Oxycontin [Oxycodone] Palpitations   Rifamycins Palpitations   Tylenol [Acetaminophen] Nausea And Vomiting, Nausea Only and Other (See Comments)    Irritates stomach ulcer Abdominal pain   Ultram [Tramadol] Nausea And Vomiting and Palpitations    Antimicrobials this admission: Cefepime 11/23 >11/24 Flagyl 11/23 x1 Vanco 11/23 >> 11/28 CTX 11/25 >> 11/28  Dose adjustments this admission: N/A  Microbiology results: 11/23 BCx: NG x 5 days  Thank you for allowing pharmacy to be a part of this patient's care.  Blane Ohara, PharmD, BCPS PGY2 Pharmacy Resident

## 2023-02-05 NOTE — Progress Notes (Signed)
[facility] night cross cover note:   I was notified by RN of the patient's request for a one-time additional dose of IV Dilaudid in the setting of breakthrough discomfort relating to the right lower extremity, in spite of existing orders for prn IV Dilaudid.  I subsequently placed an order for a one-time additional dose of IV Dilaudid.     Newton Pigg, DO Hospitalist

## 2023-02-05 NOTE — Progress Notes (Signed)
Patient ID: Deborah [name], female   DOB: 1965-06-15, 57 y.o.   MRN: [id number] Patient seen in follow-up for revision right below-knee amputation.  There is no drainage in the wound VAC canister there is a good suction fit.  Discussed with patient that she still has a small ulcer over the left heel that extends down to bone.  Initial MRI scans did show bone involvement.  Discussed that initial treatment plan was to proceed with the amputation of the right foot which had no motor function and extensive necrotic tissue.  Patient did require a revision amputation secondary to the poor microcirculation.  Repeat MRI scan is pending.  Discussed with patient that we still will need to proceed with surgical intervention on the left foot but would still prefer to proceed with amputation of the left once patient is stable with the prosthesis on the right.  This was the initial treatment plan.  Recommend starting IV antibiotics for the left lower extremity and discharge on oral antibiotics.

## 2023-02-06 DIAGNOSIS — T8781 Dehiscence of amputation stump: Secondary | ICD-10-CM | POA: Diagnosis not present

## 2023-02-06 LAB — MAGNESIUM: Magnesium: 1.5 mg/dL — ABNORMAL LOW (ref 1.7–2.4)

## 2023-02-06 LAB — CBC
HCT: 30.1 % — ABNORMAL LOW (ref 36.0–46.0)
Hemoglobin: 9.4 g/dL — ABNORMAL LOW (ref 12.0–15.0)
MCH: 29.3 pg (ref 26.0–34.0)
MCHC: 31.2 g/dL (ref 30.0–36.0)
MCV: 93.8 fL (ref 80.0–100.0)
Platelets: 313 10*3/uL (ref 150–400)
RBC: 3.21 MIL/uL — ABNORMAL LOW (ref 3.87–5.11)
RDW: 14.9 % (ref 11.5–15.5)
WBC: 16.6 10*3/uL — ABNORMAL HIGH (ref 4.0–10.5)
nRBC: 0 % (ref 0.0–0.2)

## 2023-02-06 LAB — COMPREHENSIVE METABOLIC PANEL
ALT: <5 U/L (ref 0–44)
AST: 12 U/L — ABNORMAL LOW (ref 15–41)
Albumin: 2.1 g/dL — ABNORMAL LOW (ref 3.5–5.0)
Alkaline Phosphatase: 93 U/L (ref 38–126)
Anion gap: 11 (ref 5–15)
BUN: 25 mg/dL — ABNORMAL HIGH (ref 6–20)
CO2: 23 mmol/L (ref 22–32)
Calcium: 8.4 mg/dL — ABNORMAL LOW (ref 8.9–10.3)
Chloride: 101 mmol/L (ref 98–111)
Creatinine, Ser: 4.81 mg/dL — ABNORMAL HIGH (ref 0.44–1.00)
GFR, Estimated: 10 mL/min — ABNORMAL LOW (ref >60)
Glucose, Bld: 160 mg/dL — ABNORMAL HIGH (ref 70–99)
Potassium: 3.4 mmol/L — ABNORMAL LOW (ref 3.5–5.1)
Sodium: 135 mmol/L (ref 135–145)
Total Bilirubin: 0.6 mg/dL (ref <1.2)
Total Protein: 6.3 g/dL — ABNORMAL LOW (ref 6.5–8.1)

## 2023-02-06 LAB — GLUCOSE, CAPILLARY
Glucose-Capillary: 148 mg/dL — ABNORMAL HIGH (ref 70–99)
Glucose-Capillary: 144 mg/dL — ABNORMAL HIGH (ref 70–99)
Glucose-Capillary: 161 mg/dL — ABNORMAL HIGH (ref 70–99)
Glucose-Capillary: 169 mg/dL — ABNORMAL HIGH (ref 70–99)

## 2023-02-06 LAB — MRSA NEXT GEN BY PCR, NASAL: MRSA by PCR Next Gen: NOT DETECTED

## 2023-02-06 LAB — URIC ACID: Uric Acid, Serum: 6.5 mg/dL (ref 2.5–7.1)

## 2023-02-06 MED ORDER — HYDROMORPHONE HCL 1 MG/ML IJ SOLN: 0.5000 mg | Freq: Once | INTRAMUSCULAR | Status: DC

## 2023-02-06 MED ORDER — LORAZEPAM 2 MG/ML IJ SOLN
1.0000 mg | Freq: Four times a day (QID) | INTRAMUSCULAR | Status: DC | PRN
  Administered 2023-02-06 – 2023-02-15 (×25): 1 mg via INTRAVENOUS
  Filled 2023-02-06 (×25): qty 1

## 2023-02-06 NOTE — Plan of Care (Signed)
  Problem: Education: Goal: Knowledge of General Education information will improve Description: Including pain rating scale, medication(s)/side effects and non-pharmacologic comfort measures Outcome: Progressing   Problem: Health Behavior/Discharge Planning: Goal: Ability to manage health-related needs will improve Outcome: Progressing   Problem: Clinical Measurements: Goal: Ability to maintain clinical measurements within normal limits will improve Outcome: Progressing Goal: Will remain free from infection Outcome: Progressing   Problem: Pain Management: Goal: General experience of comfort will improve Outcome: Progressing   Problem: Skin Integrity: Goal: Risk for impaired skin integrity will decrease Outcome: Progressing   Problem: Coping: Goal: Ability to adjust to condition or change in health will improve Outcome: Progressing

## 2023-02-06 NOTE — Progress Notes (Signed)
[facility] Progress Note   Subjective: Seen and examined at bedside. Did completed dialysis yesterday. In better spirits today.   Objective Vitals:   02/05/23 1759 02/05/23 1959 02/05/23 2157 02/06/23 0004  BP: 116/68 94/67 (!) 123/100 104/88  Pulse: 89 (!) 103 99   Resp: 18 16  19   Temp: 98.8 F (37.1 C) 98.9 F (37.2 C)  98.7 F (37.1 C)  TempSrc:  Axillary  Oral  SpO2: 99% 94%  98%  Weight: 125.1 kg     Height:       Physical Exam General: Chronically ill appearing female, nad  Heart:  RRR NO M/R/G Lungs: CTAB Abdomen: Obese NABS Extremities: R BKA wound vac in place Neuro: awake, alert Dialysis Access: RIJ [facility] drsg intact  Additional Objective Labs: Basic Metabolic Panel: Recent Labs  Lab 02/03/23 0331 02/04/23 0446 02/05/23 0541  NA 134* 138 135  K 3.3* 3.7 4.0  CL 105 106 104  CO2 23 23 19*  GLUCOSE 358* 109* 148*  BUN 19 24* 31*  CREATININE 3.92* 3.95* 5.17*  CALCIUM 8.3* 8.4* 8.8*  PHOS 3.2 3.1 3.8   Liver Function Tests: Recent Labs  Lab 02/03/23 0331 02/04/23 0446 02/05/23 0541  ALBUMIN 1.9* 1.8* 2.3*   No results for input(s): "LIPASE", "AMYLASE" in the last 168 hours.  CBC: Recent Labs  Lab 01/31/23 0750 01/31/23 2325 02/01/23 0433 02/02/23 0458 02/03/23 0331 02/05/23 0541  WBC 14.3*  --  16.7* 14.2* 14.8* 17.2*  HGB 6.8*   < > 7.7* 9.2* 9.3* 9.9*  HCT 23.1*   < > 24.9* 29.9* 29.3* 32.2*  MCV 96.3  --  94.7 93.7 93.0 93.6  PLT 342  --  340 319 343 379   < > = values in this interval not displayed.   Blood Culture    Component Value Date/Time   SDES BLOOD RIGHT HAND 01/30/2023 0436   SPECREQUEST  01/30/2023 0436    BOTTLES DRAWN AEROBIC ONLY Blood Culture results may not be optimal due to an inadequate volume of blood received in culture bottles   CULT  01/30/2023 0436    NO GROWTH 5 DAYS Performed at [facility], [address] [place], [place] [address]    REPTSTATUS 02/04/2023 FINAL 01/30/2023  0436    Cardiac Enzymes: No results for input(s): "CKTOTAL", "CKMB", "CKMBINDEX", "TROPONINI" in the last 168 hours. CBG: Recent Labs  Lab 02/05/23 0645 02/05/23 0822 02/05/23 1140 02/05/23 2158 02/06/23 0817  GLUCAP 136* 133* 191* 173* 148*   Iron Studies: No results for input(s): "IRON", "TIBC", "TRANSFERRIN", "FERRITIN" in the last 72 hours. @lablastinr3 @ Studies/Results: No results found. Medications:  cefTRIAXone (ROCEPHIN)  IV Stopped (02/05/23 2144)   [START ON 02/07/2023] vancomycin       sodium chloride   Intravenous Once   sodium chloride   Intravenous Once   vitamin C  1,000 mg Oral Daily   busPIRone  5 mg Oral TID   darbepoetin (ARANESP) injection - DIALYSIS  100 mcg Subcutaneous Q Sat-1800   ferric citrate  210 mg Oral TID WC   Gerhardt's butt cream   Topical TID   heparin  5,000 Units Subcutaneous Q8H   insulin aspart  0-9 Units Subcutaneous TID WC   metoprolol tartrate  50 mg Oral BID   midodrine  10 mg Oral TID WC   mirtazapine  15 mg Oral QHS   nutrition supplement (JUVEN)  1 packet Oral BID BM   pantoprazole  40 mg Oral  BID   polyethylene glycol  17 g Oral BID   potassium chloride  40 mEq Oral Once   senna-docusate  2 tablet Oral QHS   sodium chloride flush  3 mL Intravenous Q12H   zinc sulfate (50mg  elemental zinc)  220 mg Oral Daily     Dialysis Orders:  MWF [facility]  4h  400/800  121kg   3K/2.5Ca bath   TDC   Heparin none - last OP HD 11/18, post wt 124.6kg  - has been usually reaching dry wt - missed 2 sessions in last 3 wks 11/20 and 11/13 - was in hospital 10/9- 11/04 here  - dry wt lowered 6 kg 2 wks ago - venofer 100mg  q hd thru 12/04 - mircera 100 mcg q 2 wks, last 10/5, due 10/19    Assessment/Plan: Sepsis - presenting w/ SIRS, due to dehisced [name]. IV abx started and ortho consulted. S/p bka revision 11/27. MRI of L foot pending. Per primary  ESKD - on HD MWF. Back on schedule. Next HD Mon. 12/2.  HTN - bp's stable/acceptable,  takes metoprolol at home. Cont meds as needed.  Volume - no gross vol excess on exam. Anemia of eskd - hgb up to 9.9 Overdue for esa, started darbe here at 100 mcg weekly. Tranfuse prn.  MBD ckd - CCa in range. Phos at goal. Continue binders.  DM2- per primary  Tomasa Blase PA-C [facility] 02/06/2023,9:22 AM

## 2023-02-06 NOTE — Progress Notes (Signed)
TR[facility] night cross cover note:   I was notified by RN that the patient is complaining of breakthrough discomfort associate with her right lower extremity, noting that the patient is not yet eligible for her next prn dose of Dilaudid. I ordered a one-time extra dose of Dilaudid for her at this time.    Newton Pigg, DO Hospitalist

## 2023-02-06 NOTE — Progress Notes (Signed)
PROGRESS NOTE  Deborah [name] LKG:[id number] DOB: [birth year] DOA: 01/29/2023 PCP: Pcp, No   LOS: 8 days   Brief Narrative / Interim history: Patient is a 57 y.o.  female with history of ESRD on HD MWF, PAF-not on any anticoagulation due to recent GI bleeding, HFpEF, DM-2, HTN, chronic pain syndrome on narcotics-who underwent BKA 10/23-presented with a right BKA stump dehiscence.   Subjective / 24h Interval events: Patient in bed, appears comfortable, denies any headache, no fever, no chest pain or pressure, no shortness of breath , no abdominal pain. No focal weakness.  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 Lajoyce Corners 11/27.  Continue wound VAC  Left heel ulcer -with likely osteomyelitis and some surrounding acute cellulitis, present on admission, Dr. Lajoyce Corners following, on IV antibiotics, MRI pending.  Follow cultures.  ESRD on HD-nephrology consulted  Hypokalemia-replace as indicated, continue to monitor  Generalized body tremors /jerking movement-on 11/25, no recurrence since then.  Neurology briefly evaluated, appears to be functional  History of GI bleed October 2024-thought to be due to AVMs based on the EGD.  No longer an issue, hemoglobin overall stable  History of [place]-supportive care, she would benefit from weight loss  PAF-not on anticoagulation due to recent GI bleed.  Continue metoprolol  Remdesivir CHF-volume management per HD  Obesity, morbid-BMI 40.3.  She would benefit from goals  Chronic pain syndrome-continue pain regimen as is.  Avoid increasing narcotics  Depression/anxiety/panic attacks-continue hydroxyzine, BuSpar  History of tardive dyskinesia-supportive care  History of malignant duodenal carcinoid-status postresection in the past  GOC  -patient did mention to me this morning that she is thinking about stopping dialysis as her quality of life is quite poor.  She mentioned the same to Dr. Thedore Mins with nephrology and she is thinking about hospice, but for now wants to continue full medical treatment  DM2 -controlled  Lab Results  Component Value Date   HGBA1C 5.7 (H) 02/04/2023   CBG (last 3)  Recent Labs    02/05/23 0822 02/05/23 1140 02/05/23 2158  GLUCAP 133* 191* 173*   Right IJ hemodialysis catheter present.  Scheduled Meds:  sodium chloride   Intravenous Once   sodium chloride   Intravenous Once   vitamin C  1,000 mg Oral Daily   busPIRone  5 mg Oral TID   darbepoetin (ARANESP) injection - DIALYSIS  100 mcg Subcutaneous Q Sat-1800   ferric citrate  210 mg Oral TID WC   Gerhardt's butt cream   Topical TID   heparin  5,000 Units Subcutaneous Q8H   insulin aspart  0-9 Units Subcutaneous TID WC   metoprolol tartrate  50 mg Oral BID   midodrine  10 mg Oral TID WC   mirtazapine  15 mg Oral QHS   nutrition supplement (JUVEN)  1 packet Oral BID BM   pantoprazole  40 mg Oral BID   polyethylene glycol  17 g Oral BID   potassium chloride  40 mEq Oral Once   senna-docusate  2 tablet Oral QHS   sodium chloride flush  3 mL Intravenous Q12H   zinc sulfate (50mg  elemental zinc)  220 mg Oral Daily   Continuous Infusions:  cefTRIAXone (ROCEPHIN)  IV Stopped (02/05/23 2144)   [START ON 02/07/2023] vancomycin     PRN Meds:.acetaminophen, bisacodyl, guaiFENesin-dextromethorphan, hydrALAZINE, HYDROmorphone (DILAUDID)  injection, hydrOXYzine, labetalol, loperamide, LORazepam, methocarbamol, metoprolol tartrate, ondansetron **OR** ondansetron (ZOFRAN) IV, mouth rinse, phenol, sodium chloride flush   Diet Orders (From admission, onward)     Start     Ordered   02/02/23 1300  Diet Carb Modified Fluid consistency: Thin; Room service appropriate? Yes  Diet effective now       Question Answer Comment  Diet-HS Snack? Nothing    Calorie Level Medium 1600-2000   Fluid consistency: Thin   Room service appropriate? Yes      02/02/23 1300            DVT prophylaxis: SCD's Start: 02/02/23 1640 heparin injection 5,000 Units Start: 01/29/23 2200   Lab Results  Component Value Date   PLT 379 02/05/2023      Code Status: Full Code  Family Communication: Significant other at bedside  Status is: Inpatient Remains inpatient appropriate because: Severity of illness  Level of care: Progressive  Consultants:  Nephrology Orthopedic surgery  Objective: Vitals:   02/05/23 1759 02/05/23 1959 02/05/23 2157 02/06/23 0004  BP: 116/68 94/67 (!) 123/100 104/88  Pulse: 89 (!) 103 99   Resp: 18 16  19   Temp: 98.8 F (37.1 C) 98.9 F (37.2 C)  98.7 F (37.1 C)  TempSrc:  Axillary  Oral  SpO2: 99% 94%  98%  Weight: 125.1 kg     Height:        Intake/Output Summary (Last 24 hours) at 02/06/2023 0749 Last data filed at 02/05/2023 2203 Gross per 24 hour  Intake 3 ml  Output 700 ml  Net -697 ml   Wt Readings from Last 3 Encounters:  02/05/23 125.1 kg  01/08/23 121.2 kg  11/28/22 127.5 kg    Examination:  Awake Alert, No new F.N deficits, Normal affect [place].AT,PERRAL Supple Neck, No JVD,   Symmetrical Chest wall movement, Good air movement bilaterally, CTAB RRR,No Gallops, Rubs or new Murmurs,  +ve B.Sounds, Abd Soft, No tenderness,   Right BKA stump under bandage with wound VAC, left ankle under bandage, right IJ hemodialysis catheter   Data Reviewed: I have independently reviewed following labs and imaging studies   Recent Labs  Lab 01/31/23 0750 01/31/23 2325 02/01/23 0433 02/02/23 0458 02/03/23 0331 02/05/23 0541  WBC 14.3*  --  16.7* 14.2* 14.8* 17.2*  HGB 6.8* 7.6* 7.7* 9.2* 9.3* 9.9*  HCT 23.1* 24.8* 24.9* 29.9* 29.3* 32.2*  PLT 342  --  340 319 343 379  MCV 96.3  --  94.7 93.7 93.0 93.6  MCH 28.3  --  29.3 28.8 29.5 28.8  MCHC 29.4*  --  30.9 30.8 31.7 30.7  RDW 15.5  --   16.0* 15.7* 15.2 15.4    Recent Labs  Lab 02/01/23 0433 02/02/23 0458 02/03/23 0331 02/04/23 0446 02/04/23 1955 02/05/23 0541  NA 137 137 134* 138  --  135  K 3.3* 3.2* 3.3* 3.7  --  4.0  CL 104 103 105 106  --  104  CO2 23 23 23 23   --  19*  ANIONGAP 10 11 6 9   --  12  GLUCOSE 171* 214* 358* 109*  --  148*  BUN 15 11 19  24*  --  31*  CREATININE 4.71* 3.36* 3.92* 3.95*  --  5.17*  ALBUMIN 1.7* 1.9* 1.9* 1.8*  --  2.3*  HGBA1C  --   --   --   --  5.7*  --   CALCIUM 7.9* 8.1* 8.3* 8.4*  --  8.8*  Lab Results  Component Value Date   CHOL 104 07/22/2019   HDL 30 (L) 07/22/2019   LDLCALC 37 07/22/2019   TRIG 185 (H) 07/22/2019   CHOLHDL 3.5 07/22/2019      Recent Labs  Lab 02/01/23 0433 02/02/23 0458 02/03/23 0331 02/04/23 0446 02/04/23 1955 02/05/23 0541  HGBA1C  --   --   --   --  5.7*  --   CALCIUM 7.9* 8.1* 8.3* 8.4*  --  8.8*        Recent Results (from the past 240 hour(s))  Blood culture (routine x 2)     Status: None   Collection Time: 01/29/23  2:19 PM   Specimen: BLOOD  Result Value Ref Range Status   Specimen Description BLOOD SITE NOT SPECIFIED  Final   Special Requests   Final    BOTTLES DRAWN AEROBIC AND ANAEROBIC Blood Culture adequate volume   Culture   Final    NO GROWTH 5 DAYS Performed at [facility], [address] [place], [place] [address]    Report Status 02/03/2023 FINAL  Final  Resp panel by RT-PCR (RSV, Flu A&B, Covid) Anterior Nasal Swab     Status: None   Collection Time: 01/29/23  3:05 PM   Specimen: Anterior Nasal Swab  Result Value Ref Range Status   SARS Coronavirus 2 by RT PCR NEGATIVE NEGATIVE Final   Influenza A by PCR NEGATIVE NEGATIVE Final   Influenza B by PCR NEGATIVE NEGATIVE Final    Comment: (NOTE) The Xpert Xpress SARS-CoV-2/FLU/RSV plus assay is intended as an aid in the diagnosis of influenza from Nasopharyngeal swab specimens and should not be used as a sole basis for treatment. Nasal washings  and aspirates are unacceptable for Xpert Xpress SARS-CoV-2/FLU/RSV testing.  Fact Sheet for Patients: BloggerCourse.com  Fact Sheet for Healthcare Providers: SeriousBroker.it  This test is not yet approved or cleared by the [place] FDA and has been authorized for detection and/or diagnosis of SARS-CoV-2 by FDA under an Emergency Use Authorization (EUA). This EUA will remain in effect (meaning this test can be used) for the duration of the COVID-19 declaration under Section 564(b)(1) of the Act, 21 U.S.C. section 360bbb-3(b)(1), unless the authorization is terminated or revoked.     Resp Syncytial Virus by PCR NEGATIVE NEGATIVE Final    Comment: (NOTE) Fact Sheet for Patients: BloggerCourse.com  Fact Sheet for Healthcare Providers: SeriousBroker.it  This test is not yet approved or cleared by the [place] FDA and has been authorized for detection and/or diagnosis of SARS-CoV-2 by FDA under an Emergency Use Authorization (EUA). This EUA will remain in effect (meaning this test can be used) for the duration of the COVID-19 declaration under Section 564(b)(1) of the Act, 21 U.S.C. section 360bbb-3(b)(1), unless the authorization is terminated or revoked.  Performed at [facility], [address], [place], [place] [address]   Blood culture (routine x 2)     Status: None   Collection Time: 01/30/23  4:36 AM   Specimen: BLOOD RIGHT HAND  Result Value Ref Range Status   Specimen Description BLOOD RIGHT HAND  Final   Special Requests   Final    BOTTLES DRAWN AEROBIC ONLY Blood Culture results may not be optimal due to an inadequate volume of blood received in culture bottles   Culture   Final    NO GROWTH 5 DAYS Performed at [facility], [address] [place], [place] [address]  Report Status 02/04/2023 FINAL  Final  MRSA Next Gen by PCR, Nasal      Status: None   Collection Time: 02/01/23 10:57 PM   Specimen: Nasal Mucosa; Nasal Swab  Result Value Ref Range Status   MRSA by PCR Next Gen NOT DETECTED NOT DETECTED Final    Comment: (NOTE) The GeneXpert MRSA Assay (FDA approved for NASAL specimens only), is one component of a comprehensive MRSA colonization surveillance program. It is not intended to diagnose MRSA infection nor to guide or monitor treatment for MRSA infections. Test performance is not FDA approved in patients less than 39 years old. Performed at [facility], [address] [place], [place] [address]      Radiology Studies: No results found.   Signature  -    Susa Raring M.D on 02/06/2023 at 7:50 AM   -  To page go to [email or web address]

## 2023-02-07 ENCOUNTER — Inpatient Hospital Stay (HOSPITAL_COMMUNITY): Payer: 59

## 2023-02-07 DIAGNOSIS — T8781 Dehiscence of amputation stump: Secondary | ICD-10-CM | POA: Diagnosis not present

## 2023-02-07 LAB — CBC WITH DIFFERENTIAL/PLATELET
Abs Immature Granulocytes: 0.2 10*3/uL — ABNORMAL HIGH (ref 0.00–0.07)
Basophils Absolute: 0.1 10*3/uL (ref 0.0–0.1)
Basophils Relative: 1 %
Eosinophils Absolute: 0.5 10*3/uL (ref 0.0–0.5)
Eosinophils Relative: 3 %
HCT: 29 % — ABNORMAL LOW (ref 36.0–46.0)
Hemoglobin: 8.9 g/dL — ABNORMAL LOW (ref 12.0–15.0)
Immature Granulocytes: 1 %
Lymphocytes Relative: 22 %
Lymphs Abs: 3.7 10*3/uL (ref 0.7–4.0)
MCH: 29.1 pg (ref 26.0–34.0)
MCHC: 30.7 g/dL (ref 30.0–36.0)
MCV: 94.8 fL (ref 80.0–100.0)
Monocytes Absolute: 1.1 10*3/uL — ABNORMAL HIGH (ref 0.1–1.0)
Monocytes Relative: 7 %
Neutro Abs: 11.3 10*3/uL — ABNORMAL HIGH (ref 1.7–7.7)
Neutrophils Relative %: 66 %
Platelets: 346 10*3/uL (ref 150–400)
RBC: 3.06 MIL/uL — ABNORMAL LOW (ref 3.87–5.11)
RDW: 14.9 % (ref 11.5–15.5)
WBC: 16.8 10*3/uL — ABNORMAL HIGH (ref 4.0–10.5)
nRBC: 0 % (ref 0.0–0.2)

## 2023-02-07 LAB — GLUCOSE, CAPILLARY
Glucose-Capillary: 142 mg/dL — ABNORMAL HIGH (ref 70–99)
Glucose-Capillary: 110 mg/dL — ABNORMAL HIGH (ref 70–99)
Glucose-Capillary: 227 mg/dL — ABNORMAL HIGH (ref 70–99)
Glucose-Capillary: 170 mg/dL — ABNORMAL HIGH (ref 70–99)

## 2023-02-07 LAB — PROCALCITONIN: Procalcitonin: 0.53 ng/mL

## 2023-02-07 LAB — C-REACTIVE PROTEIN: CRP: 7.1 mg/dL — ABNORMAL HIGH (ref <1.0)

## 2023-02-07 MED ORDER — OXYCODONE HCL ER 10 MG PO T12A
10.0000 mg | EXTENDED_RELEASE_TABLET | Freq: Two times a day (BID) | ORAL | Status: DC
  Administered 2023-02-07 – 2023-02-15 (×16): 10 mg via ORAL
  Filled 2023-02-07 (×16): qty 1

## 2023-02-07 MED ORDER — ALTEPLASE 2 MG IJ SOLR: 2.0000 mg | Freq: Once | INTRAMUSCULAR | Status: DC | PRN

## 2023-02-07 MED ORDER — HYDROMORPHONE HCL 1 MG/ML IJ SOLN
0.5000 mg | Freq: Once | INTRAMUSCULAR | Status: AC
  Administered 2023-02-07: 0.5 mg via INTRAVENOUS
  Filled 2023-02-07: qty 0.5

## 2023-02-07 MED ORDER — NEPRO/CARBSTEADY PO LIQD: 237.0000 mL | ORAL | Status: DC

## 2023-02-07 MED ORDER — LIDOCAINE-PRILOCAINE 2.5-2.5 % EX CREA: 1.0000 | TOPICAL_CREAM | CUTANEOUS | Status: DC | PRN

## 2023-02-07 MED ORDER — CLONAZEPAM 0.5 MG PO TBDP
0.5000 mg | ORAL_TABLET | Freq: Two times a day (BID) | ORAL | Status: DC
  Administered 2023-02-07 – 2023-02-15 (×17): 0.5 mg via ORAL
  Filled 2023-02-07 (×17): qty 1

## 2023-02-07 MED ORDER — POTASSIUM CHLORIDE CRYS ER 10 MEQ PO TBCR
10.0000 meq | EXTENDED_RELEASE_TABLET | Freq: Once | ORAL | Status: AC
  Administered 2023-02-07: 10 meq via ORAL
  Filled 2023-02-07: qty 1

## 2023-02-07 MED ORDER — LIDOCAINE HCL (PF) 1 % IJ SOLN: 5.0000 mL | INTRAMUSCULAR | Status: DC | PRN

## 2023-02-07 MED ORDER — HYDROMORPHONE HCL 1 MG/ML IJ SOLN
1.0000 mg | INTRAMUSCULAR | Status: DC | PRN
  Administered 2023-02-07 – 2023-02-15 (×55): 1 mg via INTRAVENOUS
  Filled 2023-02-07 (×55): qty 1

## 2023-02-07 MED ORDER — RENA-VITE PO TABS
1.0000 | ORAL_TABLET | Freq: Every day | ORAL | Status: DC
  Administered 2023-02-07 – 2023-02-13 (×7): 1 via ORAL
  Filled 2023-02-07 (×7): qty 1

## 2023-02-07 MED ORDER — HEPARIN SODIUM (PORCINE) 1000 UNIT/ML DIALYSIS
1000.0000 [IU] | INTRAMUSCULAR | Status: DC | PRN
  Administered 2023-02-10: 3800 [IU]
  Filled 2023-02-07 (×5): qty 1

## 2023-02-07 MED ORDER — ANTICOAGULANT SODIUM CITRATE 4% (200MG/5ML) IV SOLN: 5.0000 mL | Status: DC | PRN

## 2023-02-07 MED ORDER — PENTAFLUOROPROP-TETRAFLUOROETH EX AERO: 1.0000 | INHALATION_SPRAY | CUTANEOUS | Status: DC | PRN

## 2023-02-07 MED ORDER — HYDROMORPHONE HCL 1 MG/ML IJ SOLN
1.0000 mg | Freq: Once | INTRAMUSCULAR | Status: DC
  Filled 2023-02-07: qty 1

## 2023-02-07 MED ORDER — MAGNESIUM SULFATE 2 GM/50ML IV SOLN
2.0000 g | Freq: Once | INTRAVENOUS | Status: AC
  Administered 2023-02-07: 2 g via INTRAVENOUS
  Filled 2023-02-07: qty 50

## 2023-02-07 MED ORDER — NALOXONE HCL 0.4 MG/ML IJ SOLN: 0.4000 mg | INTRAMUSCULAR | Status: DC | PRN

## 2023-02-07 NOTE — Plan of Care (Signed)
  Problem: Health Behavior/Discharge Planning: Goal: Ability to manage health-related needs will improve Outcome: Progressing   Problem: Clinical Measurements: Goal: Ability to maintain clinical measurements within normal limits will improve Outcome: Progressing Goal: Will remain free from infection Outcome: Progressing   Problem: Coping: Goal: Level of anxiety will decrease Outcome: Progressing   Problem: Pain Management: Goal: General experience of comfort will improve Outcome: Progressing   Problem: Skin Integrity: Goal: Risk for impaired skin integrity will decrease Outcome: Progressing

## 2023-02-07 NOTE — Progress Notes (Signed)
PROGRESS NOTE  Deborah [name] JXB:[id number] DOB: 09-10-1965 DOA: 01/29/2023 PCP: Pcp, No   LOS: 9 days   Brief Narrative / Interim history: Patient is a 57 y.o.  female with history of ESRD on HD MWF, PAF-not on any anticoagulation due to recent GI bleeding, HFpEF, DM-2, HTN, chronic pain syndrome on narcotics-who underwent BKA 10/23-presented with a right BKA stump dehiscence.   Subjective -   Patient in bed, appears comfortable, denies any headache, no fever, no chest pain or pressure, no shortness of breath , no abdominal pain. No new focal weakness.  Says she is having incredible amount discomfort in her right lower extremity stump and wants some Dilaudid urgently, counseled against overuse.   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 Lajoyce Corners 11/27.  Continue wound VAC.  Left heel ulcer -with likely osteomyelitis and some surrounding acute cellulitis, present on admission, Dr. Lajoyce Corners following, on IV antibiotics, MRI pending.  Follow cultures.  Some narcotic seeking behavior with history of chronic pain.  Definitely exhibiting some narcotic seeking behavior, counseled multiple times against overuse, cautiously use narcotics for acute pain with some Narcan on board.    ESRD on HD-nephrology consulted, noncompliant, refusing to go to dialysis on 02/07/2023, counseled.  Hypokalemia-replace as indicated, continue to monitor  Generalized body tremors /jerking movement-on 11/25, no recurrence since then.  Neurology briefly evaluated, appears to be functional  History of GI bleed October 2024-thought to be due to AVMs based on the EGD.  No longer an issue, hemoglobin overall stable  History of [place]-supportive care, she would benefit from weight loss  PAF-not on anticoagulation due  to recent GI bleed.  Continue metoprolol  Remdesivir CHF-volume management per HD  Obesity, morbid-BMI 40.3.  She would benefit from goals  Depression/anxiety/panic attacks- continue  BuSpar  History of tardive dyskinesia- supportive care  History of malignant duodenal carcinoid-status postresection in the past  GOC -patient did mention to me this morning that she is thinking about stopping dialysis as her quality of life is quite poor.  She mentioned the same to Dr. Thedore Mins with nephrology and she is thinking about hospice, but for now wants to continue full medical treatment  DM2 -controlled  Lab Results  Component Value Date   HGBA1C 5.7 (H) 02/04/2023   CBG (last 3)  Recent Labs    02/06/23 1204 02/06/23 1657 02/06/23 2208  GLUCAP 144* 161* 169*   Right IJ hemodialysis catheter present.  Scheduled Meds:  sodium chloride   Intravenous Once   sodium chloride   Intravenous Once   vitamin C  1,000 mg Oral Daily   busPIRone  5 mg Oral TID   clonazepam  0.5 mg Oral BID   darbepoetin (ARANESP) injection - DIALYSIS  100 mcg Subcutaneous Q Sat-1800   ferric citrate  210 mg Oral TID WC   Gerhardt's butt cream   Topical TID   heparin  5,000 Units Subcutaneous Q8H   insulin aspart  0-9 Units Subcutaneous TID WC   metoprolol tartrate  50 mg Oral BID   midodrine  10 mg Oral TID WC   mirtazapine  15 mg Oral QHS   nutrition supplement (JUVEN)  1 packet Oral BID BM   oxyCODONE  10 mg Oral Q12H   pantoprazole  40 mg Oral BID   polyethylene glycol  17 g Oral BID   senna-docusate  2 tablet Oral QHS   zinc sulfate (50mg  elemental zinc)  220 mg Oral Daily   Continuous Infusions:  anticoagulant sodium citrate     cefTRIAXone (ROCEPHIN)  IV Stopped (02/06/23 2324)   vancomycin     PRN Meds:.acetaminophen, alteplase, anticoagulant sodium citrate, bisacodyl, guaiFENesin-dextromethorphan, heparin, hydrALAZINE, HYDROmorphone (DILAUDID) injection, labetalol, lidocaine (PF),  lidocaine-prilocaine, loperamide, LORazepam, methocarbamol, metoprolol tartrate, [DISCONTINUED] ondansetron **OR** ondansetron (ZOFRAN) IV, mouth rinse, pentafluoroprop-tetrafluoroeth, phenol   Diet Orders (From admission, onward)     Start     Ordered   02/02/23 1300  Diet Carb Modified Fluid consistency: Thin; Room service appropriate? Yes  Diet effective now       Question Answer Comment  Diet-HS Snack? Nothing   Calorie Level Medium 1600-2000   Fluid consistency: Thin   Room service appropriate? Yes      02/02/23 1300            DVT prophylaxis: SCD's Start: 02/02/23 1640 heparin injection 5,000 Units Start: 01/29/23 2200   Lab Results  Component Value Date   PLT 346 02/07/2023      Code Status: Full Code  Family Communication: Significant other at bedside  Status is: Inpatient Remains inpatient appropriate because: Severity of illness  Level of care: Progressive  Consultants:  Nephrology Orthopedic surgery  Objective: Vitals:   02/06/23 2105 02/06/23 2106 02/06/23 2308 02/07/23 0418  BP: 103/67 103/67 136/70 112/60  Pulse: 90 90 94 90  Resp: 18  16 18   Temp: 98.2 F (36.8 C)  (!) 97.5 F (36.4 C) 97.6 F (36.4 C)  TempSrc: Oral  Oral Oral  SpO2: 93%  95% 94%  Weight:      Height:        Intake/Output Summary (Last 24 hours) at 02/07/2023 0747 Last data filed at 02/06/2023 2108 Gross per 24 hour  Intake 3 ml  Output --  Net 3 ml   Wt Readings from Last 3 Encounters:  02/05/23 125.1 kg  01/08/23 121.2 kg  11/28/22 127.5 kg    Examination:  Awake Alert, No new F.N deficits, Normal affect [place].AT,PERRAL Supple Neck, No JVD,   Symmetrical Chest wall movement, Good air movement bilaterally, CTAB RRR,No Gallops, Rubs or new Murmurs,  +ve B.Sounds, Abd Soft, No tenderness,   Right BKA stump under bandage with wound VAC, left ankle under bandage, right IJ hemodialysis catheter   Data Reviewed: I have independently reviewed following labs and  imaging studies   Recent Labs  Lab 02/02/23 0458 02/03/23 0331 02/05/23 0541 02/06/23 1757 02/07/23 0423  WBC 14.2* 14.8* 17.2* 16.6* 16.8*  HGB 9.2* 9.3* 9.9* 9.4* 8.9*  HCT 29.9* 29.3* 32.2* 30.1* 29.0*  PLT 319 343 379 313 346  MCV 93.7 93.0 93.6 93.8 94.8  MCH 28.8 29.5 28.8 29.3 29.1  MCHC 30.8 31.7 30.7 31.2 30.7  RDW 15.7* 15.2 15.4 14.9 14.9  LYMPHSABS  --   --   --   --  3.7  MONOABS  --   --   --   --  1.1*  EOSABS  --   --   --   --  0.5  BASOSABS  --   --   --   --  0.1    Recent Labs  Lab 02/02/23 0458 02/03/23 0331 02/04/23 0446 02/04/23 1955 02/05/23 0541 02/06/23 1757 02/07/23 0423  NA 137 134* 138  --  135 135  --   K 3.2* 3.3* 3.7  --  4.0 3.4*  --   CL 103 105 106  --  104 101  --   CO2 23 23 23   --  19* 23  --   ANIONGAP 11 6 9   --  12 11  --   GLUCOSE 214* 358* 109*  --  148* 160*  --   BUN 11 19 24*  --  31* 25*  --   CREATININE 3.36* 3.92* 3.95*  --  5.17* 4.81*  --   AST  --   --   --   --   --  12*  --   ALT  --   --   --   --   --  <5  --   ALKPHOS  --   --   --   --   --  93  --   BILITOT  --   --   --   --   --  0.6  --   ALBUMIN 1.9* 1.9* 1.8*  --  2.3* 2.1*  --   CRP  --   --   --   --   --   --  7.1*  PROCALCITON  --   --   --   --   --   --  0.53  HGBA1C  --   --   --  5.7*  --   --   --   MG  --   --   --   --   --  1.5*  --   CALCIUM 8.1* 8.3* 8.4*  --  8.8* 8.4*  --     Lab Results  Component Value Date   CHOL 104 07/22/2019   HDL 30 (L) 07/22/2019   LDLCALC 37 07/22/2019   TRIG 185 (H) 07/22/2019   CHOLHDL 3.5 07/22/2019      Recent Labs  Lab 02/02/23 0458 02/03/23 0331 02/04/23 0446 02/04/23 1955 02/05/23 0541 02/06/23 1757 02/07/23 0423  CRP  --   --   --   --   --   --  7.1*  PROCALCITON  --   --   --   --   --   --  0.53  HGBA1C  --   --   --  5.7*  --   --   --   MG  --   --   --   --   --  1.5*  --   CALCIUM 8.1* 8.3* 8.4*  --  8.8* 8.4*  --         Recent Results (from the past 240 hour(s))   Blood culture (routine x 2)     Status: None   Collection Time: 01/29/23  2:19 PM   Specimen: BLOOD  Result Value Ref Range Status   Specimen Description BLOOD SITE NOT SPECIFIED  Final   Special Requests   Final    BOTTLES DRAWN AEROBIC AND ANAEROBIC Blood Culture adequate volume   Culture   Final    NO GROWTH 5 DAYS Performed at [facility], [address], [place], [place] [address]    Report Status 02/03/2023 FINAL  Final  Resp panel by RT-PCR (RSV, Flu A&B, Covid) Anterior Nasal Swab     Status: None   Collection Time: 01/29/23  3:05 PM   Specimen: Anterior Nasal Swab  Result Value Ref Range Status   SARS Coronavirus 2 by RT PCR NEGATIVE NEGATIVE Final   Influenza A by PCR NEGATIVE NEGATIVE Final  Influenza B by PCR NEGATIVE NEGATIVE Final    Comment: (NOTE) The Xpert Xpress SARS-CoV-2/FLU/RSV plus assay is intended as an aid in the diagnosis of influenza from Nasopharyngeal swab specimens and should not be used as a sole basis for treatment. Nasal washings and aspirates are unacceptable for Xpert Xpress SARS-CoV-2/FLU/RSV testing.  Fact Sheet for Patients: BloggerCourse.com  Fact Sheet for Healthcare Providers: SeriousBroker.it  This test is not yet approved or cleared by the [place] FDA and has been authorized for detection and/or diagnosis of SARS-CoV-2 by FDA under an Emergency Use Authorization (EUA). This EUA will remain in effect (meaning this test can be used) for the duration of the COVID-19 declaration under Section 564(b)(1) of the Act, 21 U.S.C. section 360bbb-3(b)(1), unless the authorization is terminated or revoked.     Resp Syncytial Virus by PCR NEGATIVE NEGATIVE Final    Comment: (NOTE) Fact Sheet for Patients: BloggerCourse.com  Fact Sheet for Healthcare Providers: SeriousBroker.it  This test is not yet approved or cleared by the  [place] FDA and has been authorized for detection and/or diagnosis of SARS-CoV-2 by FDA under an Emergency Use Authorization (EUA). This EUA will remain in effect (meaning this test can be used) for the duration of the COVID-19 declaration under Section 564(b)(1) of the Act, 21 U.S.C. section 360bbb-3(b)(1), unless the authorization is terminated or revoked.  Performed at [facility], [address] [place], [place] [address]   Blood culture (routine x 2)     Status: None   Collection Time: 01/30/23  4:36 AM   Specimen: BLOOD RIGHT HAND  Result Value Ref Range Status   Specimen Description BLOOD RIGHT HAND  Final   Special Requests   Final    BOTTLES DRAWN AEROBIC ONLY Blood Culture results may not be optimal due to an inadequate volume of blood received in culture bottles   Culture   Final    NO GROWTH 5 DAYS Performed at [facility], [address], [place], [place] [address]    Report Status 02/04/2023 FINAL  Final  MRSA Next Gen by PCR, Nasal     Status: None   Collection Time: 02/01/23 10:57 PM   Specimen: Nasal Mucosa; Nasal Swab  Result Value Ref Range Status   MRSA by PCR Next Gen NOT DETECTED NOT DETECTED Final    Comment: (NOTE) The GeneXpert MRSA Assay (FDA approved for NASAL specimens only), is one component of a comprehensive MRSA colonization surveillance program. It is not intended to diagnose MRSA infection nor to guide or monitor treatment for MRSA infections. Test performance is not FDA approved in patients less than 55 years old. Performed at [facility], [address] [place], [place] [address]   MRSA Next Gen by PCR, Nasal     Status: None   Collection Time: 02/06/23  6:05 AM   Specimen: Nasal Mucosa; Nasal Swab  Result Value Ref Range Status   MRSA by PCR Next Gen NOT DETECTED NOT DETECTED Final    Comment: (NOTE) The GeneXpert MRSA Assay (FDA approved for NASAL specimens only), is one component of a comprehensive MRSA  colonization surveillance program. It is not intended to diagnose MRSA infection nor to guide or monitor treatment for MRSA infections. Test performance is not FDA approved in patients less than 85 years old. Performed at [facility], [address], [place], [place] [address]      Radiology Studies: No results found.   Signature  -  Susa Raring M.D on 02/07/2023 at 7:47 AM   -  To page go to [email or web address]

## 2023-02-07 NOTE — Progress Notes (Signed)
Contacted [facility] and [facility] to inquire if there are plans for pt to resume at [facility] at d/c. Awaiting response from clinics. Will assist as needed.   Olivia Canter Renal Navigator [phone]

## 2023-02-07 NOTE — Progress Notes (Signed)
[facility] Progress Note   Subjective:   Patient seen and examined in room. Reports pain all over but the worst is phantom pain in RLE.  Denies CP, SOB and n/v/d.  Refused dialysis this AM.  States she may be able to go later if her pain is better.   Objective Vitals:   02/06/23 2106 02/06/23 2308 02/07/23 0418 02/07/23 0803  BP: 103/67 136/70 112/60 (!) 121/44  Pulse: 90 94 90 90  Resp:  16 18 20   Temp:  (!) 97.5 F (36.4 C) 97.6 F (36.4 C) 97.6 F (36.4 C)  TempSrc:  Oral Oral Oral  SpO2:  95% 94% 97%  Weight:      Height:       Physical Exam General:chronically ill appearing female in NAD Heart:RRR, no mrg Lungs:CTAB, nml WOB on RA Abdomen:soft, NTND Extremities:no LE edema, R BKA w/wound vac in place Dialysis Access: [facility]   [facility] Weights   02/02/23 0853 02/05/23 1520 02/05/23 1759  Weight: 113.4 kg 124.2 kg 125.1 kg    Intake/Output Summary (Last 24 hours) at 02/07/2023 0957 Last data filed at 02/06/2023 2108 Gross per 24 hour  Intake 3 ml  Output --  Net 3 ml    Additional Objective Labs: Basic Metabolic Panel: Recent Labs  Lab 02/03/23 0331 02/04/23 0446 02/05/23 0541 02/06/23 1757  NA 134* 138 135 135  K 3.3* 3.7 4.0 3.4*  CL 105 106 104 101  CO2 23 23 19* 23  GLUCOSE 358* 109* 148* 160*  BUN 19 24* 31* 25*  CREATININE 3.92* 3.95* 5.17* 4.81*  CALCIUM 8.3* 8.4* 8.8* 8.4*  PHOS 3.2 3.1 3.8  --    Liver Function Tests: Recent Labs  Lab 02/04/23 0446 02/05/23 0541 02/06/23 1757  AST  --   --  12*  ALT  --   --  <5  ALKPHOS  --   --  93  BILITOT  --   --  0.6  PROT  --   --  6.3*  ALBUMIN 1.8* 2.3* 2.1*   CBC: Recent Labs  Lab 02/02/23 0458 02/03/23 0331 02/05/23 0541 02/06/23 1757 02/07/23 0423  WBC 14.2* 14.8* 17.2* 16.6* 16.8*  NEUTROABS  --   --   --   --  11.3*  HGB 9.2* 9.3* 9.9* 9.4* 8.9*  HCT 29.9* 29.3* 32.2* 30.1* 29.0*  MCV 93.7 93.0 93.6 93.8 94.8  PLT 319 343 379 313 346   CBG: Recent Labs  Lab  02/06/23 0817 02/06/23 1204 02/06/23 1657 02/06/23 2208 02/07/23 0800  GLUCAP 148* 144* 161* 169* 142*   Medications:  anticoagulant sodium citrate     cefTRIAXone (ROCEPHIN)  IV Stopped (02/06/23 2324)   vancomycin      sodium chloride   Intravenous Once   sodium chloride   Intravenous Once   vitamin C  1,000 mg Oral Daily   busPIRone  5 mg Oral TID   clonazepam  0.5 mg Oral BID   darbepoetin (ARANESP) injection - DIALYSIS  100 mcg Subcutaneous Q Sat-1800   ferric citrate  210 mg Oral TID WC   Gerhardt's butt cream   Topical TID   heparin  5,000 Units Subcutaneous Q8H   insulin aspart  0-9 Units Subcutaneous TID WC   metoprolol tartrate  50 mg Oral BID   midodrine  10 mg Oral TID WC   mirtazapine  15 mg Oral QHS   nutrition supplement (JUVEN)  1 packet Oral BID BM   oxyCODONE  10 mg Oral Q12H   pantoprazole  40 mg Oral BID   polyethylene glycol  17 g Oral BID   senna-docusate  2 tablet Oral QHS   zinc sulfate (50mg  elemental zinc)  220 mg Oral Daily    Dialysis Orders: MWF NW  4h  400/800  121kg   3K/2.5Ca bath   TDC   Heparin none - last OP HD 11/18, post wt 124.6kg  - has been usually reaching dry wt - missed 2 sessions in last 3 wks 11/20 and 11/13 - was in hospital 10/9- 11/04 here  - dry wt lowered 6 kg 2 wks ago - venofer 100mg  q hd thru 12/04 - mircera 100 mcg q 2 wks, last 10/5, due 10/19     Assessment/Plan: Sepsis - presenting w/ SIRS, due to dehisced [name]. IV abx started and ortho consulted. S/p bka revision 11/27. Per primary  [name] - MRI of L foot pending. Likely w/osteo. Dr. Lajoyce Corners following.  ESKD - on HD MWF. Refused HD this AM. Counseled on importance of compliance. Would benefit from palliative care consult to discuss GOC.  HTN - BP in goal. On metoprolol 50mg  BID and midodrine 10mg  pre HD.  Cont meds as needed.  Volume - no gross vol excess on exam.  UF as tolerated.  Anemia of eskd - hgb up to 9.9 On aranesp qwk, last  11/30. MBD ckd - CCa in range. Phos at goal. Continue binders.  DM2- per primary Nutrition - Carb modified diet w/fluid restrictions. Give protein supplements. Renal Vit.   Virgina Norfolk, PA-C [facility] 02/07/2023,9:57 AM  LOS: 9 days

## 2023-02-07 NOTE — Progress Notes (Signed)
Responding to patient call light. Patient requests pain medication, something for anxiety, and to sit on side of bed. Patient educated that I would be unable to provide the medications and allow her to sit on the side of the bed unattended as it is unsafe. Patient responded loudly "Lavenia Atlas been doing it all week, I've had a problem with you since you came in here this morning'. Patient continues to yell at this RN, Consulting civil engineer called to room. Patient education reinforced at this time. See MAR, Report given to cindy RN for assumption of care.

## 2023-02-07 NOTE — Progress Notes (Signed)
Per floor Nurse, patient refused dialysis this morning.  Nephrologist alerted, and Charge Nurse informed.  Stacie Glaze, LPN-KDU

## 2023-02-08 ENCOUNTER — Inpatient Hospital Stay (HOSPITAL_COMMUNITY): Payer: 59

## 2023-02-08 DIAGNOSIS — Z515 Encounter for palliative care: Secondary | ICD-10-CM | POA: Diagnosis not present

## 2023-02-08 DIAGNOSIS — Z89511 Acquired absence of right leg below knee: Secondary | ICD-10-CM | POA: Diagnosis not present

## 2023-02-08 DIAGNOSIS — D72829 Elevated white blood cell count, unspecified: Secondary | ICD-10-CM | POA: Diagnosis not present

## 2023-02-08 DIAGNOSIS — Z7189 Other specified counseling: Secondary | ICD-10-CM | POA: Diagnosis not present

## 2023-02-08 DIAGNOSIS — T8781 Dehiscence of amputation stump: Secondary | ICD-10-CM | POA: Diagnosis not present

## 2023-02-08 LAB — CBC WITH DIFFERENTIAL/PLATELET
Abs Immature Granulocytes: 0.21 10*3/uL — ABNORMAL HIGH (ref 0.00–0.07)
Basophils Absolute: 0.1 10*3/uL (ref 0.0–0.1)
Basophils Relative: 1 %
Eosinophils Absolute: 0.5 10*3/uL (ref 0.0–0.5)
Eosinophils Relative: 3 %
HCT: 25.5 % — ABNORMAL LOW (ref 36.0–46.0)
Hemoglobin: 7.8 g/dL — ABNORMAL LOW (ref 12.0–15.0)
Immature Granulocytes: 1 %
Lymphocytes Relative: 21 %
Lymphs Abs: 3.6 10*3/uL (ref 0.7–4.0)
MCH: 28.8 pg (ref 26.0–34.0)
MCHC: 30.6 g/dL (ref 30.0–36.0)
MCV: 94.1 fL (ref 80.0–100.0)
Monocytes Absolute: 1.3 10*3/uL — ABNORMAL HIGH (ref 0.1–1.0)
Monocytes Relative: 8 %
Neutro Abs: 11.5 10*3/uL — ABNORMAL HIGH (ref 1.7–7.7)
Neutrophils Relative %: 66 %
Platelets: 324 10*3/uL (ref 150–400)
RBC: 2.71 MIL/uL — ABNORMAL LOW (ref 3.87–5.11)
RDW: 15.2 % (ref 11.5–15.5)
WBC: 17.1 10*3/uL — ABNORMAL HIGH (ref 4.0–10.5)
nRBC: 0 % (ref 0.0–0.2)

## 2023-02-08 LAB — GLUCOSE, CAPILLARY
Glucose-Capillary: 177 mg/dL — ABNORMAL HIGH (ref 70–99)
Glucose-Capillary: 125 mg/dL — ABNORMAL HIGH (ref 70–99)
Glucose-Capillary: 178 mg/dL — ABNORMAL HIGH (ref 70–99)
Glucose-Capillary: 184 mg/dL — ABNORMAL HIGH (ref 70–99)

## 2023-02-08 LAB — PROCALCITONIN: Procalcitonin: 0.63 ng/mL

## 2023-02-08 LAB — C-REACTIVE PROTEIN: CRP: 8 mg/dL — ABNORMAL HIGH (ref <1.0)

## 2023-02-08 MED ORDER — METRONIDAZOLE 500 MG PO TABS
500.0000 mg | ORAL_TABLET | Freq: Two times a day (BID) | ORAL | Status: DC
  Administered 2023-02-08 – 2023-02-15 (×14): 500 mg via ORAL
  Filled 2023-02-08 (×14): qty 1

## 2023-02-08 MED ORDER — VANCOMYCIN VARIABLE DOSE PER UNSTABLE RENAL FUNCTION (PHARMACIST DOSING): Status: DC

## 2023-02-08 MED ORDER — SODIUM CHLORIDE 0.9 % IV SOLN
1.0000 g | Freq: Once | INTRAVENOUS | Status: AC
  Administered 2023-02-08: 1 g via INTRAVENOUS
  Filled 2023-02-08: qty 1

## 2023-02-08 MED ORDER — SODIUM CHLORIDE 0.9 % IV SOLN: 1.0000 g | Freq: Every day | INTRAVENOUS | Status: DC

## 2023-02-08 MED ORDER — SODIUM CHLORIDE 0.9 % IV SOLN: 1.0000 g | Freq: Once | INTRAVENOUS | Status: DC

## 2023-02-08 MED ORDER — DEXTROSE 5 % IV SOLN
0.5000 g | INTRAVENOUS | Status: DC
  Administered 2023-02-10 – 2023-02-13 (×5): 0.5 g via INTRAVENOUS
  Filled 2023-02-08 (×8): qty 0.5

## 2023-02-08 NOTE — Progress Notes (Signed)
TRH night cross cover note:   I was contacted by patient's RN and MRI tech with request to d\c MRI of left ankle/foot as the patient is refusing this imaging due to inability to tolerate scan on basis of pain, even after pain meds were administered. I subsequently discontinued the MRI of left ankle/left foot, as above.    Newton Pigg, DO Hospitalist

## 2023-02-08 NOTE — Progress Notes (Signed)
[facility] Progress Note   Subjective:   Patient seen and examined in room.  Refused HD again this AM, says she is just not feeling well.  Discussed with patient her continued refusal of dialysis and asked if she wants to continue, she said yes.  Admits to pain in her legs.  Refused MRI which is now cancelled.  Denies CP, SOB, abdominal pain and n/v/d.   Objective Vitals:   02/07/23 2347 02/07/23 2348 02/08/23 0438 02/08/23 0809  BP: (!) 125/50  133/66 (!) 112/53  Pulse:  77 84 80  Resp: 16 15 16 18   Temp: 98.3 F (36.8 C)   98.1 F (36.7 C)  TempSrc: Oral   Oral  SpO2:  100% 98%   Weight:      Height:       Physical Exam General:chronically ill appearing, sleepy female in NAD Heart:RRR, no mrg Lungs:CTAB, nml WOB on RA Abdomen:soft, NTND Extremities:no LE edema Dialysis Access: [facility]   Filed Weights   02/02/23 0853 02/05/23 1520 02/05/23 1759  Weight: 113.4 kg 124.2 kg 125.1 kg   No intake or output data in the 24 hours ending 02/08/23 0818  Additional Objective Labs: Basic Metabolic Panel: Recent Labs  Lab 02/03/23 0331 02/04/23 0446 02/05/23 0541 02/06/23 1757  NA 134* 138 135 135  K 3.3* 3.7 4.0 3.4*  CL 105 106 104 101  CO2 23 23 19* 23  GLUCOSE 358* 109* 148* 160*  BUN 19 24* 31* 25*  CREATININE 3.92* 3.95* 5.17* 4.81*  CALCIUM 8.3* 8.4* 8.8* 8.4*  PHOS 3.2 3.1 3.8  --    Liver Function Tests: Recent Labs  Lab 02/04/23 0446 02/05/23 0541 02/06/23 1757  AST  --   --  12*  ALT  --   --  <5  ALKPHOS  --   --  93  BILITOT  --   --  0.6  PROT  --   --  6.3*  ALBUMIN 1.8* 2.3* 2.1*    CBC: Recent Labs  Lab 02/03/23 0331 02/05/23 0541 02/06/23 1757 02/07/23 0423 02/08/23 0425  WBC 14.8* 17.2* 16.6* 16.8* 17.1*  NEUTROABS  --   --   --  11.3* 11.5*  HGB 9.3* 9.9* 9.4* 8.9* 7.8*  HCT 29.3* 32.2* 30.1* 29.0* 25.5*  MCV 93.0 93.6 93.8 94.8 94.1  PLT 343 379 313 346 324   CBG: Recent Labs  Lab 02/07/23 0800 02/07/23 1135  02/07/23 1618 02/07/23 2126 02/08/23 0808  GLUCAP 142* 110* 227* 170* 177*   Medications:  anticoagulant sodium citrate     cefTRIAXone (ROCEPHIN)  IV 2 g (02/07/23 1150)   vancomycin      sodium chloride   Intravenous Once   sodium chloride   Intravenous Once   busPIRone  5 mg Oral TID   clonazepam  0.5 mg Oral BID   darbepoetin (ARANESP) injection - DIALYSIS  100 mcg Subcutaneous Q Sat-1800   feeding supplement (NEPRO CARB STEADY)  237 mL Oral Q24H   ferric citrate  210 mg Oral TID WC   Gerhardt's butt cream   Topical TID   heparin  5,000 Units Subcutaneous Q8H    HYDROmorphone (DILAUDID) injection  1 mg Intravenous Once   insulin aspart  0-9 Units Subcutaneous TID WC   metoprolol tartrate  50 mg Oral BID   midodrine  10 mg Oral TID WC   mirtazapine  15 mg Oral QHS   multivitamin  1 tablet Oral QHS   nutrition supplement (  JUVEN)  1 packet Oral BID BM   oxyCODONE  10 mg Oral Q12H   pantoprazole  40 mg Oral BID   polyethylene glycol  17 g Oral BID   senna-docusate  2 tablet Oral QHS   zinc sulfate (50mg  elemental zinc)  220 mg Oral Daily    Dialysis Orders: MWF NW  4h  400/800  121kg   3K/2.5Ca bath   TDC   Heparin none - last OP HD 11/18, post wt 124.6kg  - has been usually reaching dry wt - missed 2 sessions in last 3 wks 11/20 and 11/13 - was in hospital 10/9- 11/04 here  - dry wt lowered 6 kg 2 wks ago - venofer 100mg  q hd thru 12/04 - mircera 100 mcg q 2 wks, last 10/5, due 10/19     Assessment/Plan: Sepsis - presenting w/ SIRS, due to dehisced [name]. IV abx started and ortho consulted. S/p bka revision 11/27. Per primary  [name] - MRI of L foot cancelled. Likely w/osteo. Dr. Lajoyce Corners following.  ESKD - on HD MWF. Refused HD again this AM. Counseled on importance of compliance.  Reports she wants to continue HD and advised she needs to actually go. Would benefit from palliative care consult to discuss GOC.  HTN - BP in goal. On metoprolol 50mg  BID and  midodrine 10mg  pre HD.  Cont meds as needed.  Volume - no gross vol excess on exam.  UF as tolerated.  Anemia of eskd - hgb 7.8. On aranesp qwk, last 11/30. MBD ckd - CCa in range. Phos at goal. Continue binders.  DM2- per primary Nutrition - Carb modified diet w/fluid restrictions. Give protein supplements. Renal Vit.  Virgina Norfolk, PA-C [facility] 02/08/2023,8:18 AM  LOS: 10 days

## 2023-02-08 NOTE — Progress Notes (Signed)
PT REFUSED HD THIS AM SHIFT PER FLOOR RN

## 2023-02-08 NOTE — Progress Notes (Signed)
   Advanced Care Planning Note   Patient Name: Deborah [name]       Date: 02/08/2023 DOB: 02-25-1966  Age: 57 y.o. MRN#: [id number] Attending Physician: Leroy Sea, MD Primary Care Physician: Pcp, No Admit Date: 01/29/2023 Length of Stay: 10 days  Advanced Care Planning Details:   Persons Present: ACP Persons Present: Patient  Provider(s) Present: Palliative Medicine Provider Wynne Dust, NP  All persons present were open and agreeable to conversation about advanced care planning. Education offered on the importance of documentation and the logistics of securing advanced directives.   We had in-depth discussion on clinical status, trajectory, options moving forward, ACP topics.  Education was offered on the following:   Medical treatment options available, Possible risks and benefits of each option/intervention, Available ACP documents able to be completed, and Importance/Benefit of completing ACP documents  Forms completed include:  Other scheduled completion of HCPOA These forms can be found in the ACP link in the area under the patient's picture  After conversation, patient and/or family determined the desired discharge disposition at this time would be: To Be Determined   Summary of ACP Decisions:   Medical recommendations per consult note Spiritual care consult for HCPOA completion     Thank you for allowing [place] to participate in the care of [name] PMT will continue to support holistically.  Total ACP Time: 20 min  The time above is specific to time spend on advanced care planning. It does not include any separately billed services.  Signed by: Wynne Dust, DNP, AGNP-C Palliative Medicine Team  Team Phone # [phone] (Nights/Weekends)  02/08/2023, 4:34 PM

## 2023-02-08 NOTE — Progress Notes (Signed)
PROGRESS NOTE  Deborah [name] NWG:[id number] DOB: 1965-10-11 DOA: 01/29/2023 PCP: Pcp, No   LOS: 10 days   Brief Narrative / Interim history: Patient is a 57 y.o.  female with history of ESRD on HD MWF, PAF-not on any anticoagulation due to recent GI bleeding, HFpEF, DM-2, HTN, chronic pain syndrome on narcotics-who underwent BKA 10/23-presented with a right BKA stump dehiscence.   Subjective -   Patient in bed, comfortably woken up denies any headache chest or abdominal pain at this time.   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 Lajoyce Corners 11/27.  Continue wound VAC.  Left heel ulcer -with likely osteomyelitis and some surrounding acute cellulitis, present on admission, Dr. Lajoyce Corners following, on IV antibiotics, MRI pending, patient refused on 02/07/2023, counseled again may get it done on 02/08/2023.  Follow cultures.  So request ID to evaluate as on present antibiotics which are vancomycin and Rocephin she is having persistent leukocytosis, I think Rocephin needs to be broadened for anaerobic coverage.  Some narcotic seeking behavior with history of chronic pain.  Definitely exhibiting some narcotic seeking behavior, counseled multiple times against overuse, cautiously use narcotics for acute pain with some Narcan on board.    ESRD on HD-nephrology consulted, noncompliant, refusing to go to dialysis on 02/07/2023, counseled.  Hypokalemia-replace as indicated, continue to monitor  Generalized body tremors /jerking movement-on 11/25, no recurrence since then.  Neurology briefly evaluated, appears to be functional  History of GI bleed October 2024-thought to be due to AVMs based on the EGD.  No longer an issue, hemoglobin overall stable  History of [place]-supportive care, she would  benefit from weight loss  PAF-not on anticoagulation due to recent GI bleed.  Continue metoprolol  Remdesivir CHF-volume management per HD  Obesity, morbid-BMI 40.3.  She would benefit from goals  Depression/anxiety/panic attacks- continue  BuSpar  History of tardive dyskinesia- supportive care  History of malignant duodenal carcinoid-status postresection in the past  GOC -patient did mention to me this morning that she is thinking about stopping dialysis as her quality of life is quite poor.  She mentioned the same to Dr. Thedore Mins with nephrology and she is thinking about hospice, but for now wants to continue full medical treatment  DM2 -controlled  Lab Results  Component Value Date   HGBA1C 5.7 (H) 02/04/2023   CBG (last 3)  Recent Labs    02/07/23 1618 02/07/23 2126 02/08/23 0808  GLUCAP 227* 170* 177*   Right IJ hemodialysis catheter present.  Scheduled Meds:  sodium chloride   Intravenous Once   sodium chloride   Intravenous Once   busPIRone  5 mg Oral TID   clonazepam  0.5 mg Oral BID   darbepoetin (ARANESP) injection - DIALYSIS  100 mcg Subcutaneous Q Sat-1800   feeding supplement (NEPRO CARB STEADY)  237 mL Oral Q24H   ferric citrate  210 mg Oral TID WC   Gerhardt's butt cream   Topical TID   heparin  5,000 Units Subcutaneous Q8H    HYDROmorphone (DILAUDID) injection  1 mg Intravenous Once   insulin aspart  0-9 Units Subcutaneous TID WC   metoprolol tartrate  50 mg Oral BID   midodrine  10 mg Oral TID WC   mirtazapine  15 mg Oral QHS   multivitamin  1 tablet Oral QHS  nutrition supplement (JUVEN)  1 packet Oral BID BM   oxyCODONE  10 mg Oral Q12H   pantoprazole  40 mg Oral BID   polyethylene glycol  17 g Oral BID   senna-docusate  2 tablet Oral QHS   zinc sulfate (50mg  elemental zinc)  220 mg Oral Daily   Continuous Infusions:  anticoagulant sodium citrate     cefTRIAXone (ROCEPHIN)  IV 2 g (02/07/23 1150)   vancomycin     PRN Meds:.acetaminophen,  alteplase, anticoagulant sodium citrate, bisacodyl, guaiFENesin-dextromethorphan, heparin, hydrALAZINE, HYDROmorphone (DILAUDID) injection, labetalol, lidocaine (PF), lidocaine-prilocaine, loperamide, LORazepam, methocarbamol, metoprolol tartrate, naLOXone (NARCAN)  injection, [DISCONTINUED] ondansetron **OR** ondansetron (ZOFRAN) IV, mouth rinse, pentafluoroprop-tetrafluoroeth, phenol   Diet Orders (From admission, onward)     Start     Ordered   02/07/23 1008  Diet Carb Modified Fluid consistency: Thin; Room service appropriate? Yes; Fluid restriction: 1500 mL Fluid  Diet effective now       Question Answer Comment  Diet-HS Snack? Nothing   Calorie Level Medium 1600-2000   Fluid consistency: Thin   Room service appropriate? Yes   Fluid restriction: 1500 mL Fluid      02/07/23 1007            DVT prophylaxis: SCD's Start: 02/02/23 1640 heparin injection 5,000 Units Start: 01/29/23 2200   Lab Results  Component Value Date   PLT 324 02/08/2023      Code Status: Full Code  Family Communication: Significant other at bedside  Status is: Inpatient Remains inpatient appropriate because: Severity of illness  Level of care: Progressive  Consultants:  Nephrology Orthopedic surgery  Objective: Vitals:   02/07/23 2347 02/07/23 2348 02/08/23 0438 02/08/23 0809  BP: (!) 125/50  133/66 (!) 112/53  Pulse:  77 84 80  Resp: 16 15 16 18   Temp: 98.3 F (36.8 C)   98.1 F (36.7 C)  TempSrc: Oral   Oral  SpO2:  100% 98%   Weight:      Height:       No intake or output data in the 24 hours ending 02/08/23 1007  Wt Readings from Last 3 Encounters:  02/05/23 125.1 kg  01/08/23 121.2 kg  11/28/22 127.5 kg    Examination:  Patient in bed sleeping comfortably woken up, no focal deficits [place].AT,PERRAL Supple Neck, No JVD,   Symmetrical Chest wall movement, Good air movement bilaterally, CTAB RRR,No Gallops, Rubs or new Murmurs,  +ve B.Sounds, Abd Soft, No tenderness,    Right BKA stump under bandage with wound VAC, left ankle under bandage, right IJ hemodialysis catheter   Data Reviewed: I have independently reviewed following labs and imaging studies   Recent Labs  Lab 02/03/23 0331 02/05/23 0541 02/06/23 1757 02/07/23 0423 02/08/23 0425  WBC 14.8* 17.2* 16.6* 16.8* 17.1*  HGB 9.3* 9.9* 9.4* 8.9* 7.8*  HCT 29.3* 32.2* 30.1* 29.0* 25.5*  PLT 343 379 313 346 324  MCV 93.0 93.6 93.8 94.8 94.1  MCH 29.5 28.8 29.3 29.1 28.8  MCHC 31.7 30.7 31.2 30.7 30.6  RDW 15.2 15.4 14.9 14.9 15.2  LYMPHSABS  --   --   --  3.7 3.6  MONOABS  --   --   --  1.1* 1.3*  EOSABS  --   --   --  0.5 0.5  BASOSABS  --   --   --  0.1 0.1    Recent Labs  Lab 02/02/23 0458 02/03/23 0331 02/04/23 0446 02/04/23 1955 02/05/23 0541 02/06/23 1757 02/07/23  0423 02/08/23 0425  NA 137 134* 138  --  135 135  --   --   K 3.2* 3.3* 3.7  --  4.0 3.4*  --   --   CL 103 105 106  --  104 101  --   --   CO2 23 23 23   --  19* 23  --   --   ANIONGAP 11 6 9   --  12 11  --   --   GLUCOSE 214* 358* 109*  --  148* 160*  --   --   BUN 11 19 24*  --  31* 25*  --   --   CREATININE 3.36* 3.92* 3.95*  --  5.17* 4.81*  --   --   AST  --   --   --   --   --  12*  --   --   ALT  --   --   --   --   --  <5  --   --   ALKPHOS  --   --   --   --   --  93  --   --   BILITOT  --   --   --   --   --  0.6  --   --   ALBUMIN 1.9* 1.9* 1.8*  --  2.3* 2.1*  --   --   CRP  --   --   --   --   --   --  7.1* 8.0*  PROCALCITON  --   --   --   --   --   --  0.53 0.63  HGBA1C  --   --   --  5.7*  --   --   --   --   MG  --   --   --   --   --  1.5*  --   --   CALCIUM 8.1* 8.3* 8.4*  --  8.8* 8.4*  --   --     Lab Results  Component Value Date   CHOL 104 07/22/2019   HDL 30 (L) 07/22/2019   LDLCALC 37 07/22/2019   TRIG 185 (H) 07/22/2019   CHOLHDL 3.5 07/22/2019      Recent Labs  Lab 02/02/23 0458 02/03/23 0331 02/04/23 0446 02/04/23 1955 02/05/23 0541 02/06/23 1757 02/07/23 0423  02/08/23 0425  CRP  --   --   --   --   --   --  7.1* 8.0*  PROCALCITON  --   --   --   --   --   --  0.53 0.63  HGBA1C  --   --   --  5.7*  --   --   --   --   MG  --   --   --   --   --  1.5*  --   --   CALCIUM 8.1* 8.3* 8.4*  --  8.8* 8.4*  --   --         Recent Results (from the past 240 hour(s))  Blood culture (routine x 2)     Status: None   Collection Time: 01/29/23  2:19 PM   Specimen: BLOOD  Result Value Ref Range Status   Specimen Description BLOOD SITE NOT SPECIFIED  Final   Special Requests   Final    BOTTLES DRAWN AEROBIC AND ANAEROBIC Blood Culture adequate volume   Culture   Final  NO GROWTH 5 DAYS Performed at [facility], [address], [place], [place] [address]    Report Status 02/03/2023 FINAL  Final  Resp panel by RT-PCR (RSV, Flu A&B, Covid) Anterior Nasal Swab     Status: None   Collection Time: 01/29/23  3:05 PM   Specimen: Anterior Nasal Swab  Result Value Ref Range Status   SARS Coronavirus 2 by RT PCR NEGATIVE NEGATIVE Final   Influenza A by PCR NEGATIVE NEGATIVE Final   Influenza B by PCR NEGATIVE NEGATIVE Final    Comment: (NOTE) The Xpert Xpress SARS-CoV-2/FLU/RSV plus assay is intended as an aid in the diagnosis of influenza from Nasopharyngeal swab specimens and should not be used as a sole basis for treatment. Nasal washings and aspirates are unacceptable for Xpert Xpress SARS-CoV-2/FLU/RSV testing.  Fact Sheet for Patients: BloggerCourse.com  Fact Sheet for Healthcare Providers: SeriousBroker.it  This test is not yet approved or cleared by the [place] FDA and has been authorized for detection and/or diagnosis of SARS-CoV-2 by FDA under an Emergency Use Authorization (EUA). This EUA will remain in effect (meaning this test can be used) for the duration of the COVID-19 declaration under Section 564(b)(1) of the Act, 21 U.S.C. section 360bbb-3(b)(1), unless the  authorization is terminated or revoked.     Resp Syncytial Virus by PCR NEGATIVE NEGATIVE Final    Comment: (NOTE) Fact Sheet for Patients: BloggerCourse.com  Fact Sheet for Healthcare Providers: SeriousBroker.it  This test is not yet approved or cleared by the [place] FDA and has been authorized for detection and/or diagnosis of SARS-CoV-2 by FDA under an Emergency Use Authorization (EUA). This EUA will remain in effect (meaning this test can be used) for the duration of the COVID-19 declaration under Section 564(b)(1) of the Act, 21 U.S.C. section 360bbb-3(b)(1), unless the authorization is terminated or revoked.  Performed at [facility], [address] [place], [place] [address]   Blood culture (routine x 2)     Status: None   Collection Time: 01/30/23  4:36 AM   Specimen: BLOOD RIGHT HAND  Result Value Ref Range Status   Specimen Description BLOOD RIGHT HAND  Final   Special Requests   Final    BOTTLES DRAWN AEROBIC ONLY Blood Culture results may not be optimal due to an inadequate volume of blood received in culture bottles   Culture   Final    NO GROWTH 5 DAYS Performed at [facility], [address], [place], [place] [address]    Report Status 02/04/2023 FINAL  Final  MRSA Next Gen by PCR, Nasal     Status: None   Collection Time: 02/01/23 10:57 PM   Specimen: Nasal Mucosa; Nasal Swab  Result Value Ref Range Status   MRSA by PCR Next Gen NOT DETECTED NOT DETECTED Final    Comment: (NOTE) The GeneXpert MRSA Assay (FDA approved for NASAL specimens only), is one component of a comprehensive MRSA colonization surveillance program. It is not intended to diagnose MRSA infection nor to guide or monitor treatment for MRSA infections. Test performance is not FDA approved in patients less than 40 years old. Performed at [facility], [address] [place], [place] [address]   MRSA Next Gen by  PCR, Nasal     Status: None   Collection Time: 02/06/23  6:05 AM   Specimen: Nasal Mucosa; Nasal Swab  Result Value Ref Range Status   MRSA by PCR Next Gen NOT DETECTED  NOT DETECTED Final    Comment: (NOTE) The GeneXpert MRSA Assay (FDA approved for NASAL specimens only), is one component of a comprehensive MRSA colonization surveillance program. It is not intended to diagnose MRSA infection nor to guide or monitor treatment for MRSA infections. Test performance is not FDA approved in patients less than 48 years old. Performed at [facility], [address] [place], [place] [address]      Radiology Studies: No results found.   Signature  -    Susa Raring M.D on 02/08/2023 at 10:07 AM   -  To page go to [email or web address]

## 2023-02-08 NOTE — Progress Notes (Signed)
1245: patient refused dialysis at this time and sts "I'll try to go this evening." When told that dialysis would like to transfer her in she refused.

## 2023-02-08 NOTE — Consult Note (Addendum)
Palliative Care Consult Note                                  Date: 02/08/2023   Patient Name: Deborah [name]  DOB: Sep 10, 1965  MRN: [id number]  Age / Sex: 57 y.o., female  PCP: Pcp, No Referring Physician: Leroy Sea, MD  Reason for Consultation: Establishing goals of care  HPI/Patient Profile: 57 y.o. female  with past medical history of ESRD on HD MWF, PAF-not on any anticoagulation due to recent GI bleeding, HFpEF, DM-2, HTN, chronic pain syndrome on narcotics-who underwent BKA 10/23-presented with a right BKA stump dehiscence.  Since admission the patient has been non-compliant with iHD and stated to a couple providers about not wanting to do dialysis anymore.  PMT was consulted for GOC conversations.  Past Medical History:  Diagnosis Date   Acute back pain with sciatica, left    Acute back pain with sciatica, right    AKI (acute kidney injury) (HCC)    Anemia, unspecified    Cancer (HCC)    Carcinoid tumor of duodenum    Chest pain with normal coronary angiography 2019   Chronic kidney disease, stage 3b (HCC)    Chronic pain    Chronic systolic CHF (congestive heart failure) (HCC)    Diabetes mellitus    DKA (diabetic ketoacidosis) (HCC)    Drug-seeking behavior    21 hospitalizations and 14 CT a/p in 2 years for N/V and abdominal pain, demanding only IV dilaudid   Elevated troponin    chronic   Esophageal reflux    Fibromyalgia    Gastric ulcer    Gastroparesis    Gout    Hyperlipidemia    Hypertension    Hypokalemia    Hypomagnesemia    Lumbosacral stenosis    LVH (left ventricular hypertrophy)    Morbid obesity (HCC)    NICM (nonischemic cardiomyopathy) (HCC)    PAF (paroxysmal atrial fibrillation) (HCC)    Stroke (HCC) 02/2011   Thrombocytosis    Vitamin B12 deficiency anemia     Subjective:   This NP Wynne Dust reviewed medical records, received report from team, assessed the patient and then  meet at the patient's bedside to discuss diagnosis, prognosis, GOC, EOL wishes disposition and options.  I met with the patient at the bedside.  No family was present.   We meet to discuss diagnosis prognosis, GOC, EOL wishes, disposition and options. Concept of Palliative Care was introduced as specialized medical care for people and their families living with serious illness.  If focuses on providing relief from the symptoms and stress of a serious illness.  The goal is to improve quality of life for both the patient and the family. Values and goals of care important to patient and family were attempted to be elicited.  Created space and opportunity for patient  and family to explore thoughts and feelings regarding current medical situation   Natural trajectory and current clinical status were discussed. Questions and concerns addressed. Patient  encouraged to call with questions or concerns.    Patient/Family Understanding of Illness: She states her health is poor because she did not take care of herself when she was younger.  She developed diabetes which led to an amputation.  She is now looking at possibly another amputation.  She states that all of this including dialysis has been devastating and overwhelming.  She started hemodialysis in  April.  We had further discussion, significant discussion, about her current health status and options moving forward.  Life Review: Deferred for now  Patient Values: Deferred for now  Goals: To continue hemodialysis and try to get better  Today's Discussion: In addition to discussions described above we had extensive discussions on various topics.  She states that she has had a lot of significant changes to her health recently and she has not been processing the rapid changes very well.  She also has had some personal life changes and issues that have complicated things.  She states hemodialysis is very overwhelming.  We discussed how challenging  dialysis can be and how difficult it can be both on the body and mind.  She states that the primary problem is that she is not a morning person and she has requested them to schedule her dialysis for evenings or even third shift.  However, they continue to try to get her early in the morning which is leading to her declining/refusing.  Her preference would be for dialysis at 3:00 in the afternoon or later.  She states that she generally does not sleep well and is up all night which is why she would prefer later dialysis so she could have it done while she is just lying awake anyway.  We discussed decision to continue dialysis.  We discussed how she had previously told other providers that she did not want to continue.  She states that if for some time she did not want to continue dialysis.  However, she has been thinking about it more intently and she understands that without dialysis she would die.  At this time she would like to continue dialysis.  She states it helps to be in the hospital getting dialysis because she does not have to worry about transportation.  Getting dialysis as an outpatient is often a problem due to transportation.  I told her I would notify social work about issues to see if there are any resources to help.  Additionally she states that she was temporarily getting dialysis on [facility] [employer], otherwise she normally gets dialysis at [facility].  However, this is yet to change back over which is complicated her transportation problems.  Finally we talked about CODE STATUS.  After thorough and in-depth discussion she is elected to remain full code.  However, she does not think that she would want to be prolonged on artificial life support for long period of time.  We also talked about surrogate decision maker.  She is clear that if she was unable to make her own healthcare decisions she would want her caregiver Deborah [name] to be her decision-maker.  I shared that this is not  the current legal status and that her spouse (she is married) would be her decision maker which she does not want.  I offered to consult spiritual care to help with HCPOA completion and she is in agreement.  I offered that I would communicate our discussion with the team including a preference for a later dialysis time, if possible (although she understands it may not be feasible/possible), communication with social work about transportation issues to dialysis, and desire for [facility] completion.  I shared that I would come back in 2 days on Thursday to see her again.  I provided emotional and general support through therapeutic listening, empathy, sharing of stories, and other techniques. I answered all questions and addressed all concerns to the best of my ability.  Review of  Systems  Constitutional:  Positive for fatigue.  Respiratory:  Negative for shortness of breath.   Cardiovascular:  Negative for chest pain.  Gastrointestinal:  Negative for abdominal pain, nausea and vomiting.    Objective:   Primary Diagnoses: Present on Admission:  Dehiscence of amputation stump of right lower extremity (HCC)   Physical Exam Vitals and nursing note reviewed.  Constitutional:      General: She is not in acute distress.    Appearance: She is obese. She is ill-appearing.  HENT:     Head: Normocephalic and atraumatic.  Cardiovascular:     Rate and Rhythm: Normal rate.  Pulmonary:     Effort: Pulmonary effort is normal. No respiratory distress.  Abdominal:     General: Abdomen is protuberant.  Musculoskeletal:     Right Lower Extremity: Right leg is amputated below knee.  Skin:    General: Skin is warm and dry.     Comments: Noted dressing left heel  Neurological:     General: No focal deficit present.     Mental Status: She is alert.  Psychiatric:        Mood and Affect: Mood normal.        Behavior: Behavior normal.     Vital Signs:  BP 126/63 (BP Location: Right Wrist)   Pulse 82    Temp 97.8 F (36.6 C) (Oral)   Resp 20   Ht 5\' 6"  (1.676 m)   Wt 125.1 kg   LMP 10/10/2012   SpO2 98%   BMI 44.51 kg/m   Palliative Assessment/Data: 40%    Advanced Care Planning:   Existing Vynca/ACP Documentation: None  Primary Decision Maker: PATIENT  Code Status/Advance Care Planning: Full code  A discussion was had today regarding advanced directives. Concepts specific to code status, artifical feeding and hydration, continued IV antibiotics and rehospitalization was had.  The difference between a aggressive medical intervention path and a palliative comfort care path for this patient at this time was had.   Decisions/Changes to ACP: None today  Assessment & Plan:   Impression: 57 year old female with acute presentation and chronic comorbidities as described above.  The patient is currently admitted with BKA stump infection/dehiscence, ESRD on hemodialysis.  She has been declining hemodialysis over the past couple days and is stated that she does not want to continue.  We had an extensive discussion today and she is in agreement to continue hemodialysis.  She understands that at any point that she chooses not to continue dialysis that is a possible option.  She is requesting dialysis to be completed later in the afternoon.  She is also needing assistance with outpatient transportation to dialysis.  Finally, we are planning to complete HCPOA paperwork to enter and name her caregiver Deborah Cotta as her decision-maker/HCPOA.  Overall prognosis guarded to poor.  SUMMARY OF RECOMMENDATIONS   Remain full code Continue full scope of treatment Request later dialysis time if possible Social work consult for transportation issues to outpatient dialysis Spiritual care consult for HCPOA completion Remove brother Raquelle Gismondi from contacts per patient request Palliative medicine will follow-up on Thursday  Symptom Management:  Per primary team PMT is available to assist as  needed  Prognosis:  Unable to determine  Discharge Planning:  To Be Determined   Discussed with: Patient, medical team, nursing team, [facility] team    Thank you for allowing [place] to participate in the care of KYNNEDI DER PMT will continue to support holistically.  Time Total: 75  min  Detailed review of medical records (labs, imaging, vital signs), medically appropriate exam, discussed with treatment team, counseling and education to patient, family, & staff, documenting clinical information, medication management, coordination of care  Signed by: Wynne Dust, NP Palliative Medicine Team  Team Phone # [phone] (Nights/Weekends)  02/08/2023, 3:49 PM

## 2023-02-08 NOTE — Progress Notes (Signed)
1000: as per night nurse, patient has refused 2nd attempt at MRI. Patient was given additional pain management in attempt to make imaging more tolerable, patient refused to attempt after getting additional dose of medication

## 2023-02-08 NOTE — Progress Notes (Signed)
Due to persistent leukocytosis and ongoing issue with MRI refusal, Dr. Thedore Mins would like to change ceftriaxone to [facility] for now until ID sees.  Ulyses Southward, PharmD, BCIDP, AAHIVP, CPP Infectious Disease Pharmacist 02/08/2023 10:14 AM

## 2023-02-08 NOTE — Plan of Care (Signed)
  Problem: Education: Goal: Knowledge of General Education information will improve Description: Including pain rating scale, medication(s)/side effects and non-pharmacologic comfort measures Outcome: Progressing   Problem: Health Behavior/Discharge Planning: Goal: Ability to manage health-related needs will improve Outcome: Progressing   Problem: Clinical Measurements: Goal: Ability to maintain clinical measurements within normal limits will improve Outcome: Progressing Goal: Will remain free from infection Outcome: Progressing Goal: Diagnostic test results will improve Outcome: Progressing Goal: Respiratory complications will improve Outcome: Progressing Goal: Cardiovascular complication will be avoided Outcome: Progressing   Problem: Activity: Goal: Risk for activity intolerance will decrease Outcome: Progressing   Problem: Nutrition: Goal: Adequate nutrition will be maintained Outcome: Progressing   Problem: Coping: Goal: Level of anxiety will decrease Outcome: Progressing   Problem: Elimination: Goal: Will not experience complications related to bowel motility Outcome: Progressing Goal: Will not experience complications related to urinary retention Outcome: Progressing   Problem: Pain Management: Goal: General experience of comfort will improve Outcome: Progressing   Problem: Safety: Goal: Ability to remain free from injury will improve Outcome: Progressing   Problem: Skin Integrity: Goal: Risk for impaired skin integrity will decrease Outcome: Progressing   Problem: Education: Goal: Ability to describe self-care measures that may prevent or decrease complications (Diabetes Survival Skills Education) will improve Outcome: Progressing Goal: Individualized Educational Video(s) Outcome: Progressing   Problem: Coping: Goal: Ability to adjust to condition or change in health will improve Outcome: Progressing   Problem: Fluid Volume: Goal: Ability to  maintain a balanced intake and output will improve Outcome: Progressing   Problem: Health Behavior/Discharge Planning: Goal: Ability to identify and utilize available resources and services will improve Outcome: Progressing Goal: Ability to manage health-related needs will improve Outcome: Progressing   Problem: Metabolic: Goal: Ability to maintain appropriate glucose levels will improve Outcome: Progressing   Problem: Nutritional: Goal: Maintenance of adequate nutrition will improve Outcome: Progressing Goal: Progress toward achieving an optimal weight will improve Outcome: Progressing   Problem: Skin Integrity: Goal: Risk for impaired skin integrity will decrease Outcome: Progressing   Problem: Tissue Perfusion: Goal: Adequacy of tissue perfusion will improve Outcome: Progressing   Problem: Education: Goal: Knowledge of the prescribed therapeutic regimen will improve Outcome: Progressing Goal: Ability to verbalize activity precautions or restrictions will improve Outcome: Progressing Goal: Understanding of discharge needs will improve Outcome: Progressing   Problem: Activity: Goal: Ability to perform//tolerate increased activity and mobilize with assistive devices will improve Outcome: Progressing   Problem: Clinical Measurements: Goal: Postoperative complications will be avoided or minimized Outcome: Progressing   Problem: Self-Care: Goal: Ability to meet self-care needs will improve Outcome: Progressing   Problem: Self-Concept: Goal: Ability to maintain and perform role responsibilities to the fullest extent possible will improve Outcome: Progressing   Problem: Pain Management: Goal: Pain level will decrease with appropriate interventions Outcome: Progressing   Problem: Skin Integrity: Goal: Demonstration of wound healing without infection will improve Outcome: Progressing

## 2023-02-08 NOTE — Progress Notes (Signed)
Pharmacy Antibiotic Note  Deborah [name] is a 57 y.o. female admitted on 01/29/2023 with L-heel osteo. Pharmacy has been consulted for Vancomycin + Ceftazidime dosing.  The patient has been on Vancomycin since 11/23 - Vanc doses: 2g (11/23 @ 2105), 1g (11/24 @ 1712, 11/26 @ 1136, 11/30 @ 1952) - Vanc powder: 11/27 @ 1036 - HD sessions: 11/24 (BFR 350 3h, end 1600), 11/26 (BFR 400 4h, end 1200), 11/30 (2h BFR 400)  No levels have been taken yet - noted plans for HD both 12/2 and 12/3 but patient has refused. Will obtain a random level with AM labs and hold standing doses for now.   Will dose Ceftazidime daily for now while noncompliant with HD and/or off schedule. Once back on normal HD schedule - can do Ceftazidime with HD.  Plan: - D/c standing Vancomycin order - Vancomycin variable dosing for now pending plans for HD - Obtain a Vancomycin random level with AM labs on 12/4 - Ceftazidime 1g IV x 1 dose now followed by Ceftazidime 500 mg daily at bedtime until more compliant with HD schedule - Will continue to follow HD schedule/duration, culture results, LOT, and antibiotic de-escalation plans   Height: 5\' 6"  (167.6 cm) Weight: 125.1 kg (275 lb 12.7 oz) IBW/kg (Calculated) : 59.3  Temp (24hrs), Avg:97.9 F (36.6 C), Min:97.5 F (36.4 C), Max:98.3 F (36.8 C)  Recent Labs  Lab 02/02/23 0458 02/03/23 0331 02/04/23 0446 02/05/23 0541 02/06/23 1757 02/07/23 0423 02/08/23 0425  WBC 14.2* 14.8*  --  17.2* 16.6* 16.8* 17.1*  CREATININE 3.36* 3.92* 3.95* 5.17* 4.81*  --   --     Estimated Creatinine Clearance: 17.2 mL/min (A) (by C-G formula based on SCr of 4.81 mg/dL (H)).    Allergies  Allergen Reactions   Isovue [Iopamidol] Anaphylaxis, [name] and Other (See Comments)    11/28/17 Patient had seizure like activity and then 1 min code after 100 cc of isovue 300. Possible contrast allergy vs vasovagal episode  Cardiac Arrest   Neurontin [Gabapentin]  [name] and Swelling   Nsaids Anaphylaxis and Other (See Comments)    Hx of stomach ulcers   Penicillins Itching, Palpitations and Other (See Comments)    Flushing (Red Skin) Laryngeal Edema   Reglan [Metoclopramide] Other (See Comments)    Tardive dyskinesia    Valium [Diazepam] [name]   Zestril [Lisinopril] Anaphylaxis and Swelling    Tongue and mouth swelling Laryngeal Edema   Tolectin [Tolmetin] Nausea And Vomiting, Nausea Only and Other (See Comments)    Irritates stomach ulcer   Asa [Aspirin] Other (See Comments)    Hx of stomach ulcer   Aspartame And Phenylalanine Hives   Bentyl [Dicyclomine] Other (See Comments)    Chest pain   Hibiclens [Chlorhexidine Gluconate] Other (See Comments)    Dermatitis    Flexeril [Cyclobenzaprine] Palpitations   Oxycontin [Oxycodone] Palpitations   Rifamycins Palpitations   Tylenol [Acetaminophen] Nausea And Vomiting, Nausea Only and Other (See Comments)    Irritates stomach ulcer Abdominal pain   Ultram [Tramadol] Nausea And Vomiting and Palpitations    Antimicrobials this admission: Cefepime 11/23 >> 11/24 Flagyl 11/23 x 1; restart 12/3 >> Vancomycin 11/23 >> Rocephin 11/25 >> 11/27; restart 11/30 >> Ceftazidime 12/3 >>  Dose adjustments this admission:   Microbiology results: 11/23 COVID/flu/RSV >> neg 11/23 MRSA PCR >> neg 11/23 BCx >> NGf 12/1 MRSA PCR >> neg   Thank you for allowing pharmacy to be a part  of this patient's care.  Georgina Pillion, PharmD, BCPS, BCIDP Infectious Diseases Clinical Pharmacist 02/08/2023 11:10 AM   **Pharmacist phone directory can now be found on [email or web address] ([email or web address]).  Listed under [facility].

## 2023-02-08 NOTE — Consult Note (Signed)
[facility]    Date of Admission:  01/29/2023      Total days of antibiotics 10              Reason for Consult: Leukocytosis, L Calcaneal OM    Referring Provider: Thedore Mins Primary Care Provider: Pcp, No   Assessment: Deborah [name] is a 57 y.o. female with:    Persistent Leukocytosis -  Persistent leukocytosis despite surgical revision on right BKA. Broadened to Vanc/Merrem from Vanc/CTX due to this. On exam she has no TTP over the right stump with wound vac in place, no drainage in tubing. Peri-wound under op site is w/o signs of infection. Left heel has necrotic deep ulcer in the lateral portion of the hindfoot/heel in photos available in char. I think the leukocytosis likely reflects the ongoing OM on the left foot, which will in time be addressed with staged amputation.  **Would prefer to hold on carbapenem given she is not systemically ill, wbc is not worsening but more stable/uninimproved and left foot infection has plan for staged surgical management (though may be a while with recent revision).  -will D/C daily pro calcitonins as they are difficult to interpret in a patient with ESRD -Recommend Vanc + Ceftaz + Metronidazole --> stop meropenem.    Dehisced BKA Incision - S/P Debridement 11/28 -  -Incision appears clean and no drainage in wound vac - per Dr. Lajoyce Corners    Left Calcaneal Ulceration with Osteomyelitis -  Wound care and [facility] boot as ordered by Dr. Lajoyce Corners - though not wearing on exam.  Amputation is planned but timeline not clear.  -Recommend: 2 weeks IV Vancomycin + Ceftaz after HD sessions + oral metronidazole 500 mg BID - EOT 12/17.  -Then would use Doxycycline 100 mg BID + Augmentin 500/125 QPM in an attempt to suppress until definitive surgical management can be done.  -Happy to see back in ID clinic if Dr. Lajoyce Corners would like   Noncompliance to Medical Treatments -  Encouraged to wear [facility] boot, though she falls  asleep May benefit from PMT consult given discussions in the chart about her potentially wanting to stop HD - defer to nephro on this call but may be helpful.    Plan: Vanc + Ceftaz + oral Metronidazole x 2 weeks (EOT 12/17)  Then would start Doxy 100 mg bid + Augmentin 500/125 mg at bedtime in an attempt to suppress until surgical management can be offered.    Principal Problem:   Dehiscence of amputation stump of right lower extremity (HCC) Active Problems:   Right below-knee amputee (HCC)    sodium chloride   Intravenous Once   sodium chloride   Intravenous Once   busPIRone  5 mg Oral TID   clonazepam  0.5 mg Oral BID   darbepoetin (ARANESP) injection - DIALYSIS  100 mcg Subcutaneous Q Sat-1800   feeding supplement (NEPRO CARB STEADY)  237 mL Oral Q24H   ferric citrate  210 mg Oral TID WC   Gerhardt's butt cream   Topical TID   heparin  5,000 Units Subcutaneous Q8H    HYDROmorphone (DILAUDID) injection  1 mg Intravenous Once   insulin aspart  0-9 Units Subcutaneous TID WC   metoprolol tartrate  50 mg Oral BID   metroNIDAZOLE  500 mg Oral Q12H   midodrine  10 mg Oral TID WC   mirtazapine  15 mg Oral QHS   multivitamin  1 tablet  Oral QHS   nutrition supplement (JUVEN)  1 packet Oral BID BM   oxyCODONE  10 mg Oral Q12H   pantoprazole  40 mg Oral BID   polyethylene glycol  17 g Oral BID   senna-docusate  2 tablet Oral QHS   zinc sulfate (50mg  elemental zinc)  220 mg Oral Daily    HPI: Deborah [name] is a 57 y.o. female admitted to the hospital for painful stump wound from home.   Recent Hx:  Underwent amputation on RLE (BKA) for right calcaneal osteomyelitis with Dr. Lajoyce Corners 10/23 - discharged to SNF for rehab then back home. Noted that her stump has become increasingly painful and started with mild discharge and pain. In ER 11/15 for concern over wound infection - given oral abx and sent home, no systemic sx at that time. Back to ER with abdominal pain 11/21. 11/23 in ER  for worsening pain of the stump incision. Found to have purulent drainage and dehisced. Stump revision with Dr. Lajoyce Corners on 11/27 - op note reviewed and showed complete removal of non viable infected tissue; soft tissue involved, resected 1 cm of the distal tibia / fibula.   Has a wound on left lateral heel as well which previously showed changes concerning for early osteomyelitis of the heel on 10/13 scan. The plan was for staged amputation R then L after prosthesis was in place.   We were asked to see her for persistent leukocytosis.   On exam today she is very sleepy and not staying awake enough to answer many of our questions. Much of the history ws obtained after chart review and discussion with consulting team.   Review of Systems: Review of Systems  Unable to perform ROS: Other  Frequently falling asleep    Past Medical History:  Diagnosis Date   Acute back pain with sciatica, left    Acute back pain with sciatica, right    AKI (acute kidney injury) (HCC)    Anemia, unspecified    Cancer (HCC)    Carcinoid tumor of duodenum    Chest pain with normal coronary angiography 2019   Chronic kidney disease, stage 3b (HCC)    Chronic pain    Chronic systolic CHF (congestive heart failure) (HCC)    Diabetes mellitus    DKA (diabetic ketoacidosis) (HCC)    Drug-seeking behavior    21 hospitalizations and 14 CT a/p in 2 years for N/V and abdominal pain, demanding only IV dilaudid   Elevated troponin    chronic   Esophageal reflux    Fibromyalgia    Gastric ulcer    Gastroparesis    Gout    Hyperlipidemia    Hypertension    Hypokalemia    Hypomagnesemia    Lumbosacral stenosis    LVH (left ventricular hypertrophy)    Morbid obesity (HCC)    NICM (nonischemic cardiomyopathy) (HCC)    PAF (paroxysmal atrial fibrillation) (HCC)    Stroke (HCC) 02/2011   Thrombocytosis    Vitamin B12 deficiency anemia     Social History   Tobacco Use   Smoking status: Never   Smokeless  tobacco: Never  Vaping Use   Vaping status: Never Used  Substance Use Topics   Alcohol use: No   Drug use: No    Family History  Problem Relation Age of Onset   Diabetes Mother    Diabetes Father    Heart disease Father    Diabetes Sister    Congestive Heart Failure Sister  55   Diabetes Brother    Allergies  Allergen Reactions   Isovue [Iopamidol] Anaphylaxis, [name] and Other (See Comments)    11/28/17 Patient had seizure like activity and then 1 min code after 100 cc of isovue 300. Possible contrast allergy vs vasovagal episode  Cardiac Arrest   Neurontin [Gabapentin] [name] and Swelling   Nsaids Anaphylaxis and Other (See Comments)    Hx of stomach ulcers   Penicillins Itching, Palpitations and Other (See Comments)    Flushing (Red Skin) Laryngeal Edema   Reglan [Metoclopramide] Other (See Comments)    Tardive dyskinesia    Valium [Diazepam] [name]   Zestril [Lisinopril] Anaphylaxis and Swelling    Tongue and mouth swelling Laryngeal Edema   Tolectin [Tolmetin] Nausea And Vomiting, Nausea Only and Other (See Comments)    Irritates stomach ulcer   Asa [Aspirin] Other (See Comments)    Hx of stomach ulcer   Aspartame And Phenylalanine Hives   Bentyl [Dicyclomine] Other (See Comments)    Chest pain   Hibiclens [Chlorhexidine Gluconate] Other (See Comments)    Dermatitis    Flexeril [Cyclobenzaprine] Palpitations   Oxycontin [Oxycodone] Palpitations   Rifamycins Palpitations   Tylenol [Acetaminophen] Nausea And Vomiting, Nausea Only and Other (See Comments)    Irritates stomach ulcer Abdominal pain   Ultram [Tramadol] Nausea And Vomiting and Palpitations    OBJECTIVE: Blood pressure (!) 112/53, pulse 80, temperature 98.1 F (36.7 C), temperature source Oral, resp. rate 18, height 5\' 6"  (1.676 m), weight 125.1 kg, last menstrual period 10/10/2012, SpO2 98%.  Physical Exam Constitutional:      Comments: Drifts off to sleep  easily throughout our conversation   Cardiovascular:     Rate and Rhythm: Normal rate and regular rhythm.  Pulmonary:     Effort: Pulmonary effort is normal.     Breath sounds: Normal breath sounds.  Abdominal:     General: Bowel sounds are normal. There is no distension.     Palpations: Abdomen is soft.  Musculoskeletal:     Comments: Left heel ulceration photos reviewed in chart Right Amputation incision with wound vac in place, no drainage. Peri-wound is normal appearing.   Skin:    General: Skin is warm and dry.  Neurological:     Mental Status: She is oriented to person, place, and time.    Lab Results Lab Results  Component Value Date   WBC 17.1 (H) 02/08/2023   HGB 7.8 (L) 02/08/2023   HCT 25.5 (L) 02/08/2023   MCV 94.1 02/08/2023   PLT 324 02/08/2023    Lab Results  Component Value Date   CREATININE 4.81 (H) 02/06/2023   BUN 25 (H) 02/06/2023   NA 135 02/06/2023   K 3.4 (L) 02/06/2023   CL 101 02/06/2023   CO2 23 02/06/2023    Lab Results  Component Value Date   ALT <5 02/06/2023   AST 12 (L) 02/06/2023   ALKPHOS 93 02/06/2023   BILITOT 0.6 02/06/2023     Microbiology: Recent Results (from the past 240 hour(s))  Blood culture (routine x 2)     Status: None   Collection Time: 01/29/23  2:19 PM   Specimen: BLOOD  Result Value Ref Range Status   Specimen Description BLOOD SITE NOT SPECIFIED  Final   Special Requests   Final    BOTTLES DRAWN AEROBIC AND ANAEROBIC Blood Culture adequate volume   Culture   Final    NO GROWTH 5  DAYS Performed at [facility], [address] [place], [place] [address]    Report Status 02/03/2023 FINAL  Final  Resp panel by RT-PCR (RSV, Flu A&B, Covid) Anterior Nasal Swab     Status: None   Collection Time: 01/29/23  3:05 PM   Specimen: Anterior Nasal Swab  Result Value Ref Range Status   SARS Coronavirus 2 by RT PCR NEGATIVE NEGATIVE Final   Influenza A by PCR NEGATIVE NEGATIVE Final   Influenza B by PCR NEGATIVE  NEGATIVE Final    Comment: (NOTE) The Xpert Xpress SARS-CoV-2/FLU/RSV plus assay is intended as an aid in the diagnosis of influenza from Nasopharyngeal swab specimens and should not be used as a sole basis for treatment. Nasal washings and aspirates are unacceptable for Xpert Xpress SARS-CoV-2/FLU/RSV testing.  Fact Sheet for Patients: BloggerCourse.com  Fact Sheet for Healthcare Providers: SeriousBroker.it  This test is not yet approved or cleared by the [place] FDA and has been authorized for detection and/or diagnosis of SARS-CoV-2 by FDA under an Emergency Use Authorization (EUA). This EUA will remain in effect (meaning this test can be used) for the duration of the COVID-19 declaration under Section 564(b)(1) of the Act, 21 U.S.C. section 360bbb-3(b)(1), unless the authorization is terminated or revoked.     Resp Syncytial Virus by PCR NEGATIVE NEGATIVE Final    Comment: (NOTE) Fact Sheet for Patients: BloggerCourse.com  Fact Sheet for Healthcare Providers: SeriousBroker.it  This test is not yet approved or cleared by the [place] FDA and has been authorized for detection and/or diagnosis of SARS-CoV-2 by FDA under an Emergency Use Authorization (EUA). This EUA will remain in effect (meaning this test can be used) for the duration of the COVID-19 declaration under Section 564(b)(1) of the Act, 21 U.S.C. section 360bbb-3(b)(1), unless the authorization is terminated or revoked.  Performed at [facility], [address] [place], [place] [address]   Blood culture (routine x 2)     Status: None   Collection Time: 01/30/23  4:36 AM   Specimen: BLOOD RIGHT HAND  Result Value Ref Range Status   Specimen Description BLOOD RIGHT HAND  Final   Special Requests   Final    BOTTLES DRAWN AEROBIC ONLY Blood Culture results may not be optimal due to an inadequate  volume of blood received in culture bottles   Culture   Final    NO GROWTH 5 DAYS Performed at [facility], [address], [place], [place] [address]    Report Status 02/04/2023 FINAL  Final  MRSA Next Gen by PCR, Nasal     Status: None   Collection Time: 02/01/23 10:57 PM   Specimen: Nasal Mucosa; Nasal Swab  Result Value Ref Range Status   MRSA by PCR Next Gen NOT DETECTED NOT DETECTED Final    Comment: (NOTE) The GeneXpert MRSA Assay (FDA approved for NASAL specimens only), is one component of a comprehensive MRSA colonization surveillance program. It is not intended to diagnose MRSA infection nor to guide or monitor treatment for MRSA infections. Test performance is not FDA approved in patients less than 57 years old. Performed at [facility], [address] [place], [place] [address]   MRSA Next Gen by PCR, Nasal     Status: None   Collection Time: 02/06/23  6:05 AM   Specimen: Nasal Mucosa; Nasal Swab  Result Value Ref Range Status   MRSA by PCR Next Gen NOT DETECTED NOT DETECTED Final  Comment: (NOTE) The GeneXpert MRSA Assay (FDA approved for NASAL specimens only), is one component of a comprehensive MRSA colonization surveillance program. It is not intended to diagnose MRSA infection nor to guide or monitor treatment for MRSA infections. Test performance is not FDA approved in patients less than 34 years old. Performed at [facility], [address] [place], [place] [address]      Rexene Alberts, MSN, NP-C [facility]  [email or web address] Pager: [phone] Office: [phone] [facility] Main Line: [phone] *Secure Chat Communication Welcome

## 2023-02-09 DIAGNOSIS — T8781 Dehiscence of amputation stump: Secondary | ICD-10-CM | POA: Diagnosis not present

## 2023-02-09 LAB — C-REACTIVE PROTEIN: CRP: 8.2 mg/dL — ABNORMAL HIGH (ref <1.0)

## 2023-02-09 LAB — CBC WITH DIFFERENTIAL/PLATELET
Abs Immature Granulocytes: 0.15 10*3/uL — ABNORMAL HIGH (ref 0.00–0.07)
Basophils Absolute: 0.1 10*3/uL (ref 0.0–0.1)
Basophils Relative: 0 %
Eosinophils Absolute: 0.5 10*3/uL (ref 0.0–0.5)
Eosinophils Relative: 3 %
HCT: 24.9 % — ABNORMAL LOW (ref 36.0–46.0)
Hemoglobin: 7.8 g/dL — ABNORMAL LOW (ref 12.0–15.0)
Immature Granulocytes: 1 %
Lymphocytes Relative: 20 %
Lymphs Abs: 3 10*3/uL (ref 0.7–4.0)
MCH: 29.2 pg (ref 26.0–34.0)
MCHC: 31.3 g/dL (ref 30.0–36.0)
MCV: 93.3 fL (ref 80.0–100.0)
Monocytes Absolute: 1.2 10*3/uL — ABNORMAL HIGH (ref 0.1–1.0)
Monocytes Relative: 8 %
Neutro Abs: 10.2 10*3/uL — ABNORMAL HIGH (ref 1.7–7.7)
Neutrophils Relative %: 68 %
Platelets: 332 10*3/uL (ref 150–400)
RBC: 2.67 MIL/uL — ABNORMAL LOW (ref 3.87–5.11)
RDW: 15.2 % (ref 11.5–15.5)
WBC: 15.1 10*3/uL — ABNORMAL HIGH (ref 4.0–10.5)
nRBC: 0.2 % (ref 0.0–0.2)

## 2023-02-09 LAB — VANCOMYCIN, RANDOM: Vancomycin Rm: 21 ug/mL

## 2023-02-09 LAB — GLUCOSE, CAPILLARY
Glucose-Capillary: 163 mg/dL — ABNORMAL HIGH (ref 70–99)
Glucose-Capillary: 122 mg/dL — ABNORMAL HIGH (ref 70–99)
Glucose-Capillary: 196 mg/dL — ABNORMAL HIGH (ref 70–99)
Glucose-Capillary: 215 mg/dL — ABNORMAL HIGH (ref 70–99)

## 2023-02-09 MED ORDER — MELATONIN 5 MG PO TABS
5.0000 mg | ORAL_TABLET | Freq: Every evening | ORAL | Status: DC | PRN
  Administered 2023-02-11: 5 mg via ORAL
  Filled 2023-02-09: qty 1

## 2023-02-09 MED ORDER — VANCOMYCIN HCL IN DEXTROSE 1-5 GM/200ML-% IV SOLN
1000.0000 mg | INTRAVENOUS | Status: DC
  Administered 2023-02-10: 1000 mg via INTRAVENOUS
  Filled 2023-02-09 (×2): qty 200

## 2023-02-09 MED ORDER — ALUMINUM-PETROLATUM-ZINC (1-2-3 PASTE) 0.027-13.7-10% PASTE
1.0000 | PASTE | Freq: Three times a day (TID) | CUTANEOUS | Status: DC
Start: 2023-02-09 — End: 2023-02-12
  Administered 2023-02-10 – 2023-02-11 (×7): 1 via TOPICAL
  Filled 2023-02-09 (×2): qty 120
  Filled 2023-02-09: qty 1

## 2023-02-09 NOTE — Progress Notes (Signed)
   02/09/23 1600  Spiritual Encounters  Type of Visit Initial  Care provided to: Patient  Referral source Patient request  Reason for visit Advance directives  OnCall Visit No   Chaplain responded to request for AD. There was no family present at bedside. Chaplain assisted patient with AD education. Patient will page Ch when she is ready to complete AD. Chaplain remains available when needed.

## 2023-02-09 NOTE — TOC Progression Note (Signed)
Transition of Care [facility]) - Progression Note    Patient Details  Name: Deborah [name] MRN: [id number] Date of Birth: November 17, 1965  [facility] CM/SW Contact  Kermit Balo, RN Phone Number: 02/09/2023, 3:05 PM  Clinical Narrative:     Off wound vac but continues with IV abx.  TOC following. Will need HH arranged at d/c.        Expected Discharge Plan and Services                                               Social Determinants of Health (SDOH) Interventions SDOH Screenings   Food Insecurity: No Food Insecurity (01/30/2023)  Recent Concern: Food Insecurity - High Risk (01/25/2023)   Received from [facility]  Housing: Patient Declined (01/30/2023)  Transportation Needs: No Transportation Needs (01/30/2023)  Recent Concern: Transportation Needs - Unmet Transportation Needs (01/25/2023)   Received from [facility]  Utilities: Not At Risk (01/30/2023)  Recent Concern: Utilities - Medium Risk (01/25/2023)   Received from [facility]  Depression (PHQ2-9): High Risk (11/18/2022)  Financial Resource Strain: Low Risk (03/23/2021)   [facility]  Physical Activity: Not on File (10/31/2017)   Received from [place], [place]  Social Connections: Unknown (07/19/2021)   Received from [facility]  Stress: Low Risk (03/23/2021)   [facility] from [facility])  Tobacco Use: Low Risk  (02/02/2023)   Received from [facility]    Readmission Risk Interventions    11/25/2022    5:16 PM 07/05/2022    1:20 PM  Readmission Risk Prevention Plan  Transportation Screening Complete Complete  Medication Review (RN Care Manager) Complete Referral to Pharmacy  PCP or Specialist appointment within 3-5 days of discharge Complete Complete  HRI or Home Care Consult Complete Complete  SW Recovery Care/Counseling Consult Complete Complete  Palliative Care Screening Not  Applicable Not Applicable  Skilled Nursing Facility Not Applicable Not Applicable

## 2023-02-09 NOTE — Progress Notes (Signed)
Navigator has communicated with [facility] as of yesterday. It appears that pt should be able to resume at [facility] at d/c on a TTS 12:10 chair time but will need to coordinate resumption date once d/c date is known. Will attempt to discuss with pt tomorrow. Will assist as needed.   Olivia Canter Renal Navigator [phone]

## 2023-02-09 NOTE — Progress Notes (Signed)
PROGRESS NOTE  Deborah [name] KGM:[id number] DOB: 30-Apr-1965 DOA: 01/29/2023 PCP: Pcp, No   LOS: 11 days   Brief Narrative / Interim history: Patient is a 57 y.o.  female with history of ESRD on HD MWF, PAF-not on any anticoagulation due to recent GI bleeding, HFpEF, DM-2, HTN, chronic pain syndrome on narcotics-who underwent BKA 10/23-presented with a right BKA stump dehiscence.   Subjective - patient in bed, appears comfortable, denies any headache, no fever, no chest pain or pressure, no shortness of breath , no abdominal pain. No focal weakness.  According to the patient she did not sleep well last night and wants a sleeping aid.   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 Lajoyce Corners 11/27.  Continue wound VAC.  Left heel ulcer -with likely osteomyelitis and some surrounding acute cellulitis, present on admission, Dr. Lajoyce Corners following, on IV antibiotics, MRI pending, patient refused on 02/07/2023 and 02/08/2023, counseled again may get it done on 02/09/2023.  Follow cultures.  Seen by ID per ID following antibiotic regimen.  Vanc + Ceftaz + oral Metronidazole x 2 weeks (EOT 12/17)  Then would start Doxy 100 mg bid + Augmentin 500/125 mg at bedtime in an attempt to suppress until surgical management can be offered.    Await MRI, may require left BKA as well as definitive treatment but will defer that to Dr. Lajoyce Corners.     Some narcotic seeking behavior with history of chronic pain.  Definitely exhibiting some narcotic seeking behavior, counseled multiple times against overuse, cautiously use narcotics for acute pain with some Narcan on board.    ESRD on HD-nephrology consulted, noncompliant, refusing to go to dialysis on 02/07/2023, counseled.  Hypokalemia-replace as indicated, continue to  monitor  Generalized body tremors /jerking movement-on 11/25, no recurrence since then.  Neurology briefly evaluated, appears to be functional  History of GI bleed October 2024-thought to be due to AVMs based on the EGD.  No longer an issue, hemoglobin overall stable  History of [place]-supportive care, she would benefit from weight loss  PAF-not on anticoagulation due to recent GI bleed.  Continue metoprolol  Remdesivir CHF-volume management per HD  Obesity, morbid-BMI 40.3.  She would benefit from goals  Depression/anxiety/panic attacks- continue  BuSpar  History of tardive dyskinesia- supportive care  History of malignant duodenal carcinoid-status postresection in the past  GOC -patient did mention to me this morning that she is thinking about stopping dialysis as her quality of life is quite poor.  She mentioned the same to Dr. Thedore Mins with nephrology and she is thinking about hospice, but for now wants to continue full medical treatment  DM2 -controlled  Lab Results  Component Value Date   HGBA1C 5.7 (H) 02/04/2023   CBG (last 3)  Recent Labs    02/08/23 1222 02/08/23 1724 02/08/23 2124  GLUCAP 125* 178* 184*   Right IJ hemodialysis catheter present.  Scheduled Meds:  sodium chloride   Intravenous Once   sodium chloride   Intravenous Once   busPIRone  5 mg Oral TID   clonazepam  0.5 mg Oral BID   darbepoetin (ARANESP) injection - DIALYSIS  100 mcg Subcutaneous Q Sat-1800   feeding supplement (NEPRO CARB STEADY)  237 mL Oral Q24H   ferric citrate  210 mg Oral TID WC   Gerhardt's butt cream  Topical TID   heparin  5,000 Units Subcutaneous Q8H    HYDROmorphone (DILAUDID) injection  1 mg Intravenous Once   insulin aspart  0-9 Units Subcutaneous TID WC   metoprolol tartrate  50 mg Oral BID   metroNIDAZOLE  500 mg Oral Q12H   midodrine  10 mg Oral TID WC   mirtazapine  15 mg Oral QHS   multivitamin  1 tablet Oral QHS   nutrition supplement (JUVEN)  1 packet Oral BID  BM   oxyCODONE  10 mg Oral Q12H   pantoprazole  40 mg Oral BID   polyethylene glycol  17 g Oral BID   senna-docusate  2 tablet Oral QHS   vancomycin variable dose per unstable renal function (pharmacist dosing)   Does not apply See admin instructions   zinc sulfate (50mg  elemental zinc)  220 mg Oral Daily   Continuous Infusions:  anticoagulant sodium citrate     cefTAZidime (FORTAZ)  IV     PRN Meds:.acetaminophen, alteplase, anticoagulant sodium citrate, bisacodyl, guaiFENesin-dextromethorphan, heparin, hydrALAZINE, HYDROmorphone (DILAUDID) injection, labetalol, lidocaine (PF), lidocaine-prilocaine, loperamide, LORazepam, methocarbamol, metoprolol tartrate, naLOXone (NARCAN)  injection, [DISCONTINUED] ondansetron **OR** ondansetron (ZOFRAN) IV, mouth rinse, pentafluoroprop-tetrafluoroeth, phenol   Diet Orders (From admission, onward)     Start     Ordered   02/07/23 1008  Diet Carb Modified Fluid consistency: Thin; Room service appropriate? Yes; Fluid restriction: 1500 mL Fluid  Diet effective now       Question Answer Comment  Diet-HS Snack? Nothing   Calorie Level Medium 1600-2000   Fluid consistency: Thin   Room service appropriate? Yes   Fluid restriction: 1500 mL Fluid      02/07/23 1007            DVT prophylaxis: SCD's Start: 02/02/23 1640 heparin injection 5,000 Units Start: 01/29/23 2200   Lab Results  Component Value Date   PLT 332 02/09/2023      Code Status: Full Code  Family Communication: Significant other at bedside  Status is: Inpatient Remains inpatient appropriate because: Severity of illness  Level of care: Progressive  Consultants:  Nephrology Orthopedic surgery  Objective: Vitals:   02/08/23 1721 02/08/23 1950 02/08/23 2345 02/09/23 0319  BP: 97/83 95/64 128/62 (!) 127/54  Pulse: 79  (!) 101   Resp: 18     Temp: (!) 97.2 F (36.2 C) 98.1 F (36.7 C) 98 F (36.7 C) 98.2 F (36.8 C)  TempSrc: Oral Oral Oral Oral  SpO2:   100% 98%   Weight:      Height:       No intake or output data in the 24 hours ending 02/09/23 0738  Wt Readings from Last 3 Encounters:  02/05/23 125.1 kg  01/08/23 121.2 kg  11/28/22 127.5 kg    Examination:  Patient in bed sleeping comfortably woken up, no focal deficits [place].AT,PERRAL Supple Neck, No JVD,   Symmetrical Chest wall movement, Good air movement bilaterally, CTAB RRR,No Gallops, Rubs or new Murmurs,  +ve B.Sounds, Abd Soft, No tenderness,   Right BKA stump under bandage with wound VAC, left ankle under bandage, right IJ hemodialysis catheter   Data Reviewed: I have independently reviewed following labs and imaging studies   Recent Labs  Lab 02/05/23 0541 02/06/23 1757 02/07/23 0423 02/08/23 0425 02/09/23 0422  WBC 17.2* 16.6* 16.8* 17.1* 15.1*  HGB 9.9* 9.4* 8.9* 7.8* 7.8*  HCT 32.2* 30.1* 29.0* 25.5* 24.9*  PLT 379 313 346 324 332  MCV 93.6 93.8 94.8 94.1  93.3  MCH 28.8 29.3 29.1 28.8 29.2  MCHC 30.7 31.2 30.7 30.6 31.3  RDW 15.4 14.9 14.9 15.2 15.2  LYMPHSABS  --   --  3.7 3.6 3.0  MONOABS  --   --  1.1* 1.3* 1.2*  EOSABS  --   --  0.5 0.5 0.5  BASOSABS  --   --  0.1 0.1 0.1    Recent Labs  Lab 02/03/23 0331 02/04/23 0446 02/04/23 1955 02/05/23 0541 02/06/23 1757 02/07/23 0423 02/08/23 0425 02/09/23 0422  NA 134* 138  --  135 135  --   --   --   K 3.3* 3.7  --  4.0 3.4*  --   --   --   CL 105 106  --  104 101  --   --   --   CO2 23 23  --  19* 23  --   --   --   ANIONGAP 6 9  --  12 11  --   --   --   GLUCOSE 358* 109*  --  148* 160*  --   --   --   BUN 19 24*  --  31* 25*  --   --   --   CREATININE 3.92* 3.95*  --  5.17* 4.81*  --   --   --   AST  --   --   --   --  12*  --   --   --   ALT  --   --   --   --  <5  --   --   --   ALKPHOS  --   --   --   --  93  --   --   --   BILITOT  --   --   --   --  0.6  --   --   --   ALBUMIN 1.9* 1.8*  --  2.3* 2.1*  --   --   --   CRP  --   --   --   --   --  7.1* 8.0* 8.2*  PROCALCITON  --   --   --    --   --  0.53 0.63  --   HGBA1C  --   --  5.7*  --   --   --   --   --   MG  --   --   --   --  1.5*  --   --   --   CALCIUM 8.3* 8.4*  --  8.8* 8.4*  --   --   --     Lab Results  Component Value Date   CHOL 104 07/22/2019   HDL 30 (L) 07/22/2019   LDLCALC 37 07/22/2019   TRIG 185 (H) 07/22/2019   CHOLHDL 3.5 07/22/2019      Recent Labs  Lab 02/03/23 0331 02/04/23 0446 02/04/23 1955 02/05/23 0541 02/06/23 1757 02/07/23 0423 02/08/23 0425 02/09/23 0422  CRP  --   --   --   --   --  7.1* 8.0* 8.2*  PROCALCITON  --   --   --   --   --  0.53 0.63  --   HGBA1C  --   --  5.7*  --   --   --   --   --   MG  --   --   --   --  1.5*  --   --   --  CALCIUM 8.3* 8.4*  --  8.8* 8.4*  --   --   --       Recent Results (from the past 240 hour(s))  MRSA Next Gen by PCR, Nasal     Status: None   Collection Time: 02/01/23 10:57 PM   Specimen: Nasal Mucosa; Nasal Swab  Result Value Ref Range Status   MRSA by PCR Next Gen NOT DETECTED NOT DETECTED Final    Comment: (NOTE) The GeneXpert MRSA Assay (FDA approved for NASAL specimens only), is one component of a comprehensive MRSA colonization surveillance program. It is not intended to diagnose MRSA infection nor to guide or monitor treatment for MRSA infections. Test performance is not FDA approved in patients less than 107 years old. Performed at [facility], [address] [place], [place] [address]   MRSA Next Gen by PCR, Nasal     Status: None   Collection Time: 02/06/23  6:05 AM   Specimen: Nasal Mucosa; Nasal Swab  Result Value Ref Range Status   MRSA by PCR Next Gen NOT DETECTED NOT DETECTED Final    Comment: (NOTE) The GeneXpert MRSA Assay (FDA approved for NASAL specimens only), is one component of a comprehensive MRSA colonization surveillance program. It is not intended to diagnose MRSA infection nor to guide or monitor treatment for MRSA infections. Test performance is not FDA approved in patients less than 84  years old. Performed at [facility], [address], [place], [place] [address]      Radiology Studies: No results found.   Signature  -    Susa Raring M.D on 02/09/2023 at 7:38 AM   -  To page go to [email or web address]

## 2023-02-09 NOTE — Consult Note (Signed)
WOC Nurse Consult Note: Reason for Consult: Alarm for NPWT (VAC) dressing R BKA with revision.   Dr Lajoyce Corners aware and will see later today.  No further WOC needs at this time.  Wound type: Surgical, dehiscence Pressure Injury POA: Yes  Left heel and sacrum Will not follow at this time.  Please re-consult if needed.  Mike Gip MSN, RN, FNP-BC CWON Wound, Ostomy, Continence Nurse Outpatient [facility] [phone] Pager [phone]

## 2023-02-09 NOTE — Progress Notes (Signed)
[facility] Progress Note   Subjective:   Patient seen and examined at bedside.  Agrees to go to dialysis today.  Discussed timing of dialysis and inability to provide regular scheduled time for HD in the hospital. Also discussed timing of outpatient HD and patient expressed understanding.  Denies current pain, CP, SOB and n/v/d.   Objective Vitals:   02/08/23 1950 02/08/23 2345 02/09/23 0319 02/09/23 0837  BP: 95/64 128/62 (!) 127/54 (!) 125/59  Pulse:  (!) 101  82  Resp:    18  Temp: 98.1 F (36.7 C) 98 F (36.7 C) 98.2 F (36.8 C) 98 F (36.7 C)  TempSrc: Oral Oral Oral   SpO2:  100% 98%   Weight:      Height:       Physical Exam General:chronically ill appearing female in NAD Heart:RRR, no mrg Lungs:CTAB, nml WOB on RA Abdomen:soft, NTND Extremities:trace LE edema, R BKA w/wound vac in place Dialysis Access: [facility]   [facility] Weights   02/02/23 0853 02/05/23 1520 02/05/23 1759  Weight: 113.4 kg 124.2 kg 125.1 kg   No intake or output data in the 24 hours ending 02/09/23 1159  Additional Objective Labs: Basic Metabolic Panel: Recent Labs  Lab 02/03/23 0331 02/04/23 0446 02/05/23 0541 02/06/23 1757  NA 134* 138 135 135  K 3.3* 3.7 4.0 3.4*  CL 105 106 104 101  CO2 23 23 19* 23  GLUCOSE 358* 109* 148* 160*  BUN 19 24* 31* 25*  CREATININE 3.92* 3.95* 5.17* 4.81*  CALCIUM 8.3* 8.4* 8.8* 8.4*  PHOS 3.2 3.1 3.8  --    Liver Function Tests: Recent Labs  Lab 02/04/23 0446 02/05/23 0541 02/06/23 1757  AST  --   --  12*  ALT  --   --  <5  ALKPHOS  --   --  93  BILITOT  --   --  0.6  PROT  --   --  6.3*  ALBUMIN 1.8* 2.3* 2.1*   CBC: Recent Labs  Lab 02/05/23 0541 02/06/23 1757 02/07/23 0423 02/08/23 0425 02/09/23 0422  WBC 17.2* 16.6* 16.8* 17.1* 15.1*  NEUTROABS  --   --  11.3* 11.5* 10.2*  HGB 9.9* 9.4* 8.9* 7.8* 7.8*  HCT 32.2* 30.1* 29.0* 25.5* 24.9*  MCV 93.6 93.8 94.8 94.1 93.3  PLT 379 313 346 324 332   CBG: Recent Labs   Lab 02/08/23 0808 02/08/23 1222 02/08/23 1724 02/08/23 2124 02/09/23 0837  GLUCAP 177* 125* 178* 184* 163*   Medications:  anticoagulant sodium citrate     cefTAZidime (FORTAZ)  IV     vancomycin      sodium chloride   Intravenous Once   sodium chloride   Intravenous Once   busPIRone  5 mg Oral TID   clonazepam  0.5 mg Oral BID   darbepoetin (ARANESP) injection - DIALYSIS  100 mcg Subcutaneous Q Sat-1800   feeding supplement (NEPRO CARB STEADY)  237 mL Oral Q24H   ferric citrate  210 mg Oral TID WC   Gerhardt's butt cream   Topical TID   heparin  5,000 Units Subcutaneous Q8H    HYDROmorphone (DILAUDID) injection  1 mg Intravenous Once   insulin aspart  0-9 Units Subcutaneous TID WC   metoprolol tartrate  50 mg Oral BID   metroNIDAZOLE  500 mg Oral Q12H   midodrine  10 mg Oral TID WC   mirtazapine  15 mg Oral QHS   multivitamin  1 tablet Oral QHS  nutrition supplement (JUVEN)  1 packet Oral BID BM   oxyCODONE  10 mg Oral Q12H   pantoprazole  40 mg Oral BID   polyethylene glycol  17 g Oral BID   senna-docusate  2 tablet Oral QHS   zinc sulfate (50mg  elemental zinc)  220 mg Oral Daily    Dialysis Orders: MWF NW  4h  400/800  121kg   3K/2.5Ca bath   TDC   Heparin none - last OP HD 11/18, post wt 124.6kg  - has been usually reaching dry wt - missed 2 sessions in last 3 wks 11/20 and 11/13 - was in hospital 10/9- 11/04 here  - dry wt lowered 6 kg 2 wks ago - venofer 100mg  q hd thru 12/04 - mircera 100 mcg q 2 wks, last 10/5, due 10/19     Assessment/Plan: Sepsis - presenting w/ SIRS, due to dehisced [name]. IV abx started and ortho consulted. S/p bka revision 11/27. ID consulted. Recommend ABX for 2weeks with HD. Per primary  [name] - MRI of L foot cancelled. Likely w/osteo. Dr. Lajoyce Corners following. ID recommending Doxy 100mg  BID and Augmentin 500/125mg  every day. ESKD - on HD MWF. Refused HD for last 2 days.  Says she will go today.  Reports non compliance due  to timing in the AM.  Discussed timing hard to dictate in hospital due to patient load/acuity. As outpatient only certain time slots are available, a request can be placed with outpatient staff for later time if one becomes available but latest on time are typically around 1pm.  Continue to counsel on importance of compliance.    HTN - BP in goal. On metoprolol 50mg  BID and midodrine 10mg  pre HD.  Cont meds as needed.  Volume - no gross vol excess on exam.  UF as tolerated.  Anemia of eskd - hgb 7.8. On aranesp qwk, last 11/30. MBD ckd - CCa in range. Phos at goal. Continue binders.  DM2- per primary Nutrition - Carb modified diet w/fluid restrictions. Give protein supplements. Renal Vit GOC - appreciate palliative care assistance. Wants to remain full code and continue dialysis.   Virgina Norfolk, PA-C [facility] 02/09/2023,11:59 AM  LOS: 11 days

## 2023-02-09 NOTE — Plan of Care (Signed)
  Problem: Education: Goal: Knowledge of General Education information will improve Description: Including pain rating scale, medication(s)/side effects and non-pharmacologic comfort measures Outcome: Progressing   Problem: Health Behavior/Discharge Planning: Goal: Ability to manage health-related needs will improve Outcome: Progressing   Problem: Clinical Measurements: Goal: Ability to maintain clinical measurements within normal limits will improve Outcome: Progressing Goal: Will remain free from infection Outcome: Progressing Goal: Diagnostic test results will improve Outcome: Progressing Goal: Respiratory complications will improve Outcome: Progressing Goal: Cardiovascular complication will be avoided Outcome: Progressing   Problem: Activity: Goal: Risk for activity intolerance will decrease Outcome: Progressing   Problem: Nutrition: Goal: Adequate nutrition will be maintained Outcome: Progressing   Problem: Coping: Goal: Level of anxiety will decrease Outcome: Progressing   Problem: Elimination: Goal: Will not experience complications related to bowel motility Outcome: Progressing Goal: Will not experience complications related to urinary retention Outcome: Progressing   Problem: Pain Management: Goal: General experience of comfort will improve Outcome: Progressing   Problem: Safety: Goal: Ability to remain free from injury will improve Outcome: Progressing   Problem: Skin Integrity: Goal: Risk for impaired skin integrity will decrease Outcome: Progressing   Problem: Education: Goal: Ability to describe self-care measures that may prevent or decrease complications (Diabetes Survival Skills Education) will improve Outcome: Progressing Goal: Individualized Educational Video(s) Outcome: Progressing   Problem: Coping: Goal: Ability to adjust to condition or change in health will improve Outcome: Progressing   Problem: Fluid Volume: Goal: Ability to  maintain a balanced intake and output will improve Outcome: Progressing   Problem: Health Behavior/Discharge Planning: Goal: Ability to identify and utilize available resources and services will improve Outcome: Progressing Goal: Ability to manage health-related needs will improve Outcome: Progressing   Problem: Metabolic: Goal: Ability to maintain appropriate glucose levels will improve Outcome: Progressing   Problem: Nutritional: Goal: Maintenance of adequate nutrition will improve Outcome: Progressing Goal: Progress toward achieving an optimal weight will improve Outcome: Progressing   Problem: Skin Integrity: Goal: Risk for impaired skin integrity will decrease Outcome: Progressing   Problem: Tissue Perfusion: Goal: Adequacy of tissue perfusion will improve Outcome: Progressing   Problem: Education: Goal: Knowledge of the prescribed therapeutic regimen will improve Outcome: Progressing Goal: Ability to verbalize activity precautions or restrictions will improve Outcome: Progressing Goal: Understanding of discharge needs will improve Outcome: Progressing   Problem: Activity: Goal: Ability to perform//tolerate increased activity and mobilize with assistive devices will improve Outcome: Progressing   Problem: Clinical Measurements: Goal: Postoperative complications will be avoided or minimized Outcome: Progressing   Problem: Self-Care: Goal: Ability to meet self-care needs will improve Outcome: Progressing   Problem: Self-Concept: Goal: Ability to maintain and perform role responsibilities to the fullest extent possible will improve Outcome: Progressing   Problem: Pain Management: Goal: Pain level will decrease with appropriate interventions Outcome: Progressing   Problem: Skin Integrity: Goal: Demonstration of wound healing without infection will improve Outcome: Progressing

## 2023-02-09 NOTE — Progress Notes (Signed)
Patient ID: Deborah [name], female   DOB: 05-13-1965, 57 y.o.   MRN: [id number] The drainage tube has been disconnected from the wound VAC dressing.  Will discontinue wound VAC today start with dry dressing changes daily and change dressings as needed.  Anticipate MRI scan of the left heel today.  Patient states she was moving too much yesterday for the scan.

## 2023-02-09 NOTE — Progress Notes (Signed)
Pharmacy Antibiotic Note  Deborah [name] is a 57 y.o. female admitted on 01/29/2023 with L-heel osteo. Pharmacy has been consulted for Vancomycin + Ceftazidime dosing.  The patient has been on Vancomycin since 11/23 with doses given appropriately after HD however refusing HD at times. A Vancomycin random level this morning was therapeutic at 21 mcg/ml (goal of 15-25 mcg/ml) - will plan to resume Vancomycin with the patient's next HD - scheduled for today if the patient is willing to go.  Will dose Ceftazidime daily for now while noncompliant with HD and/or off schedule. Once back on normal HD schedule - can do Ceftazidime with HD.  Plan: - Resume Vancomycin 1g/HD-MWF - will monitor if patient goes or gets off-schedule - Continue Ceftazidime 500 mg daily at bedtime until more compliant with HD schedule - Will continue to follow HD schedule/duration, culture results, LOT, and antibiotic de-escalation plans   Height: 5\' 6"  (167.6 cm) Weight: 125.1 kg (275 lb 12.7 oz) IBW/kg (Calculated) : 59.3  Temp (24hrs), Avg:97.9 F (36.6 C), Min:97.2 F (36.2 C), Max:98.2 F (36.8 C)  Recent Labs  Lab 02/03/23 0331 02/04/23 0446 02/05/23 0541 02/06/23 1757 02/07/23 0423 02/08/23 0425 02/09/23 0422  WBC 14.8*  --  17.2* 16.6* 16.8* 17.1* 15.1*  CREATININE 3.92* 3.95* 5.17* 4.81*  --   --   --   VANCORANDOM  --   --   --   --   --   --  21    Estimated Creatinine Clearance: 17.2 mL/min (A) (by C-G formula based on SCr of 4.81 mg/dL (H)).    Allergies  Allergen Reactions   Isovue [Iopamidol] Anaphylaxis, [name] and Other (See Comments)    11/28/17 Patient had seizure like activity and then 1 min code after 100 cc of isovue 300. Possible contrast allergy vs vasovagal episode  Cardiac Arrest   Neurontin [Gabapentin] [name] and Swelling   Nsaids Anaphylaxis and Other (See Comments)    Hx of stomach ulcers   Penicillins Itching, Palpitations and Other (See  Comments)    Flushing (Red Skin) Laryngeal Edema   Reglan [Metoclopramide] Other (See Comments)    Tardive dyskinesia    Valium [Diazepam] [name]   Zestril [Lisinopril] Anaphylaxis and Swelling    Tongue and mouth swelling Laryngeal Edema   Tolectin [Tolmetin] Nausea And Vomiting, Nausea Only and Other (See Comments)    Irritates stomach ulcer   Asa [Aspirin] Other (See Comments)    Hx of stomach ulcer   Aspartame And Phenylalanine Hives   Bentyl [Dicyclomine] Other (See Comments)    Chest pain   Hibiclens [Chlorhexidine Gluconate] Other (See Comments)    Dermatitis    Flexeril [Cyclobenzaprine] Palpitations   Oxycontin [Oxycodone] Palpitations   Rifamycins Palpitations   Tylenol [Acetaminophen] Nausea And Vomiting, Nausea Only and Other (See Comments)    Irritates stomach ulcer Abdominal pain   Ultram [Tramadol] Nausea And Vomiting and Palpitations    Antimicrobials this admission: Cefepime 11/23 >> 11/24 Flagyl 11/23 x 1; restart 12/3 >> Vancomycin 11/23 >> Rocephin 11/25 >> 11/27; restart 11/30 >> Ceftazidime 12/3 >>  Dose adjustments this admission: 12/4 VR 21 mcg/ml >> at goal, cont current dosing  Microbiology results: 11/23 COVID/flu/RSV >> neg 11/23 MRSA PCR >> neg 11/23 BCx >> NGf 12/1 MRSA PCR >> neg   Thank you for allowing pharmacy to be a part of this patient's care.  Georgina Pillion, PharmD, BCPS, BCIDP Infectious Diseases Clinical Pharmacist 02/09/2023 7:58 AM   **  Pharmacist phone directory can now be found on [email or web address] ([email or web address]).  Listed under [facility].

## 2023-02-10 ENCOUNTER — Inpatient Hospital Stay (HOSPITAL_COMMUNITY): Payer: 59

## 2023-02-10 DIAGNOSIS — Z7189 Other specified counseling: Secondary | ICD-10-CM | POA: Diagnosis not present

## 2023-02-10 DIAGNOSIS — T8781 Dehiscence of amputation stump: Secondary | ICD-10-CM | POA: Diagnosis not present

## 2023-02-10 DIAGNOSIS — Z515 Encounter for palliative care: Secondary | ICD-10-CM | POA: Diagnosis not present

## 2023-02-10 DIAGNOSIS — Z5189 Encounter for other specified aftercare: Secondary | ICD-10-CM | POA: Diagnosis not present

## 2023-02-10 LAB — GLUCOSE, CAPILLARY
Glucose-Capillary: 143 mg/dL — ABNORMAL HIGH (ref 70–99)
Glucose-Capillary: 186 mg/dL — ABNORMAL HIGH (ref 70–99)
Glucose-Capillary: 197 mg/dL — ABNORMAL HIGH (ref 70–99)
Glucose-Capillary: 163 mg/dL — ABNORMAL HIGH (ref 70–99)

## 2023-02-10 LAB — RENAL FUNCTION PANEL
Albumin: 1.7 g/dL — ABNORMAL LOW (ref 3.5–5.0)
Anion gap: 13 (ref 5–15)
BUN: 37 mg/dL — ABNORMAL HIGH (ref 6–20)
CO2: 20 mmol/L — ABNORMAL LOW (ref 22–32)
Calcium: 7.8 mg/dL — ABNORMAL LOW (ref 8.9–10.3)
Chloride: 109 mmol/L (ref 98–111)
Creatinine, Ser: 4.88 mg/dL — ABNORMAL HIGH (ref 0.44–1.00)
GFR, Estimated: 10 mL/min — ABNORMAL LOW (ref >60)
Glucose, Bld: 203 mg/dL — ABNORMAL HIGH (ref 70–99)
Phosphorus: 5.5 mg/dL — ABNORMAL HIGH (ref 2.5–4.6)
Potassium: 3 mmol/L — ABNORMAL LOW (ref 3.5–5.1)
Sodium: 142 mmol/L (ref 135–145)

## 2023-02-10 LAB — CBC WITH DIFFERENTIAL/PLATELET
Abs Immature Granulocytes: 0.18 10*3/uL — ABNORMAL HIGH (ref 0.00–0.07)
Basophils Absolute: 0.1 10*3/uL (ref 0.0–0.1)
Basophils Relative: 0 %
Eosinophils Absolute: 0.5 10*3/uL (ref 0.0–0.5)
Eosinophils Relative: 4 %
HCT: 25.9 % — ABNORMAL LOW (ref 36.0–46.0)
Hemoglobin: 7.7 g/dL — ABNORMAL LOW (ref 12.0–15.0)
Immature Granulocytes: 1 %
Lymphocytes Relative: 16 %
Lymphs Abs: 2.3 10*3/uL (ref 0.7–4.0)
MCH: 28.1 pg (ref 26.0–34.0)
MCHC: 29.7 g/dL — ABNORMAL LOW (ref 30.0–36.0)
MCV: 94.5 fL (ref 80.0–100.0)
Monocytes Absolute: 1 10*3/uL (ref 0.1–1.0)
Monocytes Relative: 7 %
Neutro Abs: 10.6 10*3/uL — ABNORMAL HIGH (ref 1.7–7.7)
Neutrophils Relative %: 72 %
Platelets: 353 10*3/uL (ref 150–400)
RBC: 2.74 MIL/uL — ABNORMAL LOW (ref 3.87–5.11)
RDW: 15.7 % — ABNORMAL HIGH (ref 11.5–15.5)
WBC: 14.8 10*3/uL — ABNORMAL HIGH (ref 4.0–10.5)
nRBC: 0 % (ref 0.0–0.2)

## 2023-02-10 LAB — C-REACTIVE PROTEIN: CRP: 8 mg/dL — ABNORMAL HIGH (ref <1.0)

## 2023-02-10 MED ORDER — SENNOSIDES-DOCUSATE SODIUM 8.6-50 MG PO TABS: 2.0000 | ORAL_TABLET | Freq: Every evening | ORAL | Status: DC | PRN

## 2023-02-10 MED ORDER — POTASSIUM CHLORIDE CRYS ER 10 MEQ PO TBCR
10.0000 meq | EXTENDED_RELEASE_TABLET | Freq: Once | ORAL | Status: AC
  Administered 2023-02-10: 10 meq via ORAL
  Filled 2023-02-10: qty 1

## 2023-02-10 MED ORDER — POLYETHYLENE GLYCOL 3350 17 G PO PACK: 17.0000 g | PACK | Freq: Two times a day (BID) | ORAL | Status: DC | PRN

## 2023-02-10 NOTE — Progress Notes (Addendum)
PROGRESS NOTE  Deborah [name]:[id number] DOB: Apr 06, 1965 DOA: 01/29/2023 PCP: Pcp, No   LOS: 12 days   Brief Narrative / Interim history: Patient is a 57 y.o.  female with history of ESRD on HD MWF, PAF-not on any anticoagulation due to recent GI bleeding, HFpEF, DM-2, HTN, chronic pain syndrome on narcotics-who underwent BKA 10/23-presented with a right BKA stump dehiscence.   Subjective - Patient in bed, appears comfortable, denies any headache, no fever, no chest pain or pressure, no shortness of breath , no abdominal pain. No new focal weakness.   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 Lajoyce Corners 11/27.  Continue wound VAC.  Left heel ulcer -with likely osteomyelitis and some surrounding acute cellulitis, present on admission, Dr. Lajoyce Corners following, on IV antibiotics, MRI pending, patient refused on 02/07/2023 and 02/08/2023, counseled again may get it done on 02/09/2023.  Follow cultures.  Seen by ID per ID following antibiotic regimen.  Vanc + Ceftaz + oral Metronidazole x 2 weeks (EOT 12/17)  Then would start Doxy 100 mg bid + Augmentin 500/125 mg at bedtime in an attempt to suppress until surgical management can be offered.    Await MRI, may require left BKA as well as definitive treatment but will defer that to Dr. Lajoyce Corners.     Some narcotic seeking behavior with history of chronic pain.  Definitely exhibiting some narcotic seeking behavior, counseled multiple times against overuse, cautiously use narcotics for acute pain with some Narcan on board.    ESRD on HD-nephrology consulted, noncompliant, refusing to go to dialysis on 02/07/2023, counseled.  Hypokalemia-replace as indicated, continue to monitor  Generalized body tremors /jerking movement-on 11/25, no recurrence  since then.  Neurology briefly evaluated, appears to be functional  History of GI bleed October 2024-thought to be due to AVMs based on the EGD.  No longer an issue, hemoglobin overall stable  History of [place]-supportive care, she would benefit from weight loss  PAF-not on anticoagulation due to recent GI bleed.  Continue metoprolol  Hypokalemia.  Dialysis patient, low-dose potassium supplementation.    Remdesivir CHF-volume management per HD  Obesity, morbid-BMI 40.3.  She would benefit from goals  Depression/anxiety/panic attacks- continue  BuSpar  History of tardive dyskinesia- supportive care  History of malignant duodenal carcinoid-status postresection in the past  GOC -patient did mention to me this morning that she is thinking about stopping dialysis as her quality of life is quite poor.  She mentioned the same to Dr. Thedore Mins with nephrology and she is thinking about hospice, but for now wants to continue full medical treatment  DM2 -controlled  Lab Results  Component Value Date   HGBA1C 5.7 (H) 02/04/2023   CBG (last 3)  Recent Labs    02/09/23 1640 02/09/23 2150 02/10/23 0852  GLUCAP 196* 215* 143*   Right IJ hemodialysis catheter present.  Scheduled Meds:  sodium chloride   Intravenous Once   sodium chloride   Intravenous Once   aluminum-petrolatum-zinc  1 Application Topical TID   busPIRone  5 mg Oral TID   clonazepam  0.5 mg Oral BID   darbepoetin (ARANESP) injection - DIALYSIS  100 mcg Subcutaneous Q Sat-1800   feeding supplement (NEPRO CARB STEADY)  237 mL Oral Q24H   ferric citrate  210 mg Oral TID WC   heparin  5,000 Units Subcutaneous Q8H    HYDROmorphone (DILAUDID) injection  1 mg Intravenous Once   insulin aspart  0-9 Units Subcutaneous TID WC   metoprolol tartrate  50 mg Oral BID   metroNIDAZOLE  500 mg Oral Q12H   midodrine  10 mg Oral TID WC   mirtazapine  15 mg Oral QHS   multivitamin  1 tablet Oral QHS   nutrition supplement (JUVEN)  1  packet Oral BID BM   oxyCODONE  10 mg Oral Q12H   pantoprazole  40 mg Oral BID   potassium chloride  10 mEq Oral Once   zinc sulfate (50mg  elemental zinc)  220 mg Oral Daily   Continuous Infusions:  cefTAZidime (FORTAZ)  IV 0.5 g (02/10/23 0102)   vancomycin Stopped (02/10/23 0700)   PRN Meds:.acetaminophen, bisacodyl, guaiFENesin-dextromethorphan, hydrALAZINE, HYDROmorphone (DILAUDID) injection, labetalol, loperamide, LORazepam, melatonin, methocarbamol, metoprolol tartrate, naLOXone (NARCAN)  injection, [DISCONTINUED] ondansetron **OR** ondansetron (ZOFRAN) IV, mouth rinse, phenol, polyethylene glycol, senna-docusate   Diet Orders (From admission, onward)     Start     Ordered   02/07/23 1008  Diet Carb Modified Fluid consistency: Thin; Room service appropriate? Yes; Fluid restriction: 1500 mL Fluid  Diet effective now       Question Answer Comment  Diet-HS Snack? Nothing   Calorie Level Medium 1600-2000   Fluid consistency: Thin   Room service appropriate? Yes   Fluid restriction: 1500 mL Fluid      02/07/23 1007            DVT prophylaxis: SCD's Start: 02/02/23 1640 heparin injection 5,000 Units Start: 01/29/23 2200   Lab Results  Component Value Date   PLT 353 02/10/2023      Code Status: Full Code  Family Communication: Significant other at bedside  Status is: Inpatient Remains inpatient appropriate because: Severity of illness  Level of care: Progressive  Consultants:  Nephrology Orthopedic surgery  Objective: Vitals:   02/10/23 0542 02/10/23 0607 02/10/23 0612 02/10/23 0850  BP: 121/64 (!) 116/58 (!) 115/58 (!) 110/47  Pulse: 79 88 85 84  Resp: (!) 23 19 (!) 23 16  Temp:   98.6 F (37 C) (!) 97.4 F (36.3 C)  TempSrc:   Oral   SpO2: 100% 100% 99% 98%  Weight:      Height:        Intake/Output Summary (Last 24 hours) at 02/10/2023 0947 Last data filed at 02/09/2023 1700 Gross per 24 hour  Intake 720 ml  Output 0 ml  Net 720 ml    Wt  Readings from Last 3 Encounters:  02/05/23 125.1 kg  01/08/23 121.2 kg  11/28/22 127.5 kg    Examination:  Patient in bed sleeping comfortably woken up, no focal deficits [place].AT,PERRAL Supple Neck, No JVD,   Symmetrical Chest wall movement, Good air movement bilaterally, CTAB RRR,No Gallops, Rubs or new Murmurs,  +ve B.Sounds, Abd Soft, No tenderness,   Right BKA stump under bandage with wound VAC, left ankle under bandage, right IJ hemodialysis catheter   Data Reviewed: I have independently reviewed following labs and imaging studies   Recent Labs  Lab 02/06/23 1757 02/07/23 0423 02/08/23 0425 02/09/23 0422 02/10/23 0225  WBC 16.6* 16.8* 17.1* 15.1* 14.8*  HGB 9.4* 8.9* 7.8* 7.8* 7.7*  HCT 30.1* 29.0* 25.5* 24.9* 25.9*  PLT 313 346 324 332 353  MCV 93.8 94.8 94.1 93.3 94.5  MCH 29.3 29.1 28.8 29.2 28.1  MCHC 31.2 30.7 30.6 31.3 29.7*  RDW 14.9 14.9  15.2 15.2 15.7*  LYMPHSABS  --  3.7 3.6 3.0 2.3  MONOABS  --  1.1* 1.3* 1.2* 1.0  EOSABS  --  0.5 0.5 0.5 0.5  BASOSABS  --  0.1 0.1 0.1 0.1    Recent Labs  Lab 02/04/23 0446 02/04/23 1955 02/05/23 0541 02/06/23 1757 02/07/23 0423 02/08/23 0425 02/09/23 0422 02/10/23 0225 02/10/23 0747  NA 138  --  135 135  --   --   --  142  --   K 3.7  --  4.0 3.4*  --   --   --  3.0*  --   CL 106  --  104 101  --   --   --  109  --   CO2 23  --  19* 23  --   --   --  20*  --   ANIONGAP 9  --  12 11  --   --   --  13  --   GLUCOSE 109*  --  148* 160*  --   --   --  203*  --   BUN 24*  --  31* 25*  --   --   --  37*  --   CREATININE 3.95*  --  5.17* 4.81*  --   --   --  4.88*  --   AST  --   --   --  12*  --   --   --   --   --   ALT  --   --   --  <5  --   --   --   --   --   ALKPHOS  --   --   --  93  --   --   --   --   --   BILITOT  --   --   --  0.6  --   --   --   --   --   ALBUMIN 1.8*  --  2.3* 2.1*  --   --   --  1.7*  --   CRP  --   --   --   --  7.1* 8.0* 8.2*  --  8.0*  PROCALCITON  --   --   --   --  0.53 0.63   --   --   --   HGBA1C  --  5.7*  --   --   --   --   --   --   --   MG  --   --   --  1.5*  --   --   --   --   --   CALCIUM 8.4*  --  8.8* 8.4*  --   --   --  7.8*  --     Lab Results  Component Value Date   CHOL 104 07/22/2019   HDL 30 (L) 07/22/2019   LDLCALC 37 07/22/2019   TRIG 185 (H) 07/22/2019   CHOLHDL 3.5 07/22/2019      Recent Labs  Lab 02/04/23 0446 02/04/23 1955 02/05/23 0541 02/06/23 1757 02/07/23 0423 02/08/23 0425 02/09/23 0422 02/10/23 0225 02/10/23 0747  CRP  --   --   --   --  7.1* 8.0* 8.2*  --  8.0*  PROCALCITON  --   --   --   --  0.53 0.63  --   --   --   HGBA1C  --  5.7*  --   --   --   --   --   --   --  MG  --   --   --  1.5*  --   --   --   --   --   CALCIUM 8.4*  --  8.8* 8.4*  --   --   --  7.8*  --       Recent Results (from the past 240 hour(s))  MRSA Next Gen by PCR, Nasal     Status: None   Collection Time: 02/01/23 10:57 PM   Specimen: Nasal Mucosa; Nasal Swab  Result Value Ref Range Status   MRSA by PCR Next Gen NOT DETECTED NOT DETECTED Final    Comment: (NOTE) The GeneXpert MRSA Assay (FDA approved for NASAL specimens only), is one component of a comprehensive MRSA colonization surveillance program. It is not intended to diagnose MRSA infection nor to guide or monitor treatment for MRSA infections. Test performance is not FDA approved in patients less than 21 years old. Performed at [facility], [address], [place], [place] [address]   MRSA Next Gen by PCR, Nasal     Status: None   Collection Time: 02/06/23  6:05 AM   Specimen: Nasal Mucosa; Nasal Swab  Result Value Ref Range Status   MRSA by PCR Next Gen NOT DETECTED NOT DETECTED Final    Comment: (NOTE) The GeneXpert MRSA Assay (FDA approved for NASAL specimens only), is one component of a comprehensive MRSA colonization surveillance program. It is not intended to diagnose MRSA infection nor to guide or monitor treatment for MRSA infections. Test  performance is not FDA approved in patients less than 33 years old. Performed at [facility], [address], [place], [place] [address]      Radiology Studies: No results found.   Signature  -    Susa Raring M.D on 02/10/2023 at 9:47 AM   -  To page go to [email or web address]

## 2023-02-10 NOTE — Plan of Care (Signed)
Pt remains on RA. Down for dialysis this morning. Stooling.    Problem: Education: Goal: Knowledge of General Education information will improve Description: Including pain rating scale, medication(s)/side effects and non-pharmacologic comfort measures Outcome: Progressing   Problem: Health Behavior/Discharge Planning: Goal: Ability to manage health-related needs will improve Outcome: Progressing   Problem: Clinical Measurements: Goal: Ability to maintain clinical measurements within normal limits will improve Outcome: Progressing Goal: Will remain free from infection Outcome: Progressing Goal: Diagnostic test results will improve Outcome: Progressing Goal: Respiratory complications will improve Outcome: Progressing Goal: Cardiovascular complication will be avoided Outcome: Progressing   Problem: Activity: Goal: Risk for activity intolerance will decrease Outcome: Progressing   Problem: Nutrition: Goal: Adequate nutrition will be maintained Outcome: Progressing   Problem: Coping: Goal: Level of anxiety will decrease Outcome: Progressing   Problem: Elimination: Goal: Will not experience complications related to bowel motility Outcome: Progressing Goal: Will not experience complications related to urinary retention Outcome: Progressing   Problem: Pain Management: Goal: General experience of comfort will improve Outcome: Progressing   Problem: Safety: Goal: Ability to remain free from injury will improve Outcome: Progressing   Problem: Skin Integrity: Goal: Risk for impaired skin integrity will decrease Outcome: Progressing   Problem: Education: Goal: Ability to describe self-care measures that may prevent or decrease complications (Diabetes Survival Skills Education) will improve Outcome: Progressing Goal: Individualized Educational Video(s) Outcome: Progressing   Problem: Coping: Goal: Ability to adjust to condition or change in health will  improve Outcome: Progressing   Problem: Fluid Volume: Goal: Ability to maintain a balanced intake and output will improve Outcome: Progressing   Problem: Health Behavior/Discharge Planning: Goal: Ability to identify and utilize available resources and services will improve Outcome: Progressing Goal: Ability to manage health-related needs will improve Outcome: Progressing   Problem: Metabolic: Goal: Ability to maintain appropriate glucose levels will improve Outcome: Progressing   Problem: Nutritional: Goal: Maintenance of adequate nutrition will improve Outcome: Progressing Goal: Progress toward achieving an optimal weight will improve Outcome: Progressing   Problem: Skin Integrity: Goal: Risk for impaired skin integrity will decrease Outcome: Progressing   Problem: Tissue Perfusion: Goal: Adequacy of tissue perfusion will improve Outcome: Progressing   Problem: Education: Goal: Knowledge of the prescribed therapeutic regimen will improve Outcome: Progressing Goal: Ability to verbalize activity precautions or restrictions will improve Outcome: Progressing Goal: Understanding of discharge needs will improve Outcome: Progressing   Problem: Activity: Goal: Ability to perform//tolerate increased activity and mobilize with assistive devices will improve Outcome: Progressing   Problem: Clinical Measurements: Goal: Postoperative complications will be avoided or minimized Outcome: Progressing   Problem: Self-Care: Goal: Ability to meet self-care needs will improve Outcome: Progressing   Problem: Self-Concept: Goal: Ability to maintain and perform role responsibilities to the fullest extent possible will improve Outcome: Progressing   Problem: Pain Management: Goal: Pain level will decrease with appropriate interventions Outcome: Progressing   Problem: Skin Integrity: Goal: Demonstration of wound healing without infection will improve Outcome: Progressing

## 2023-02-10 NOTE — Progress Notes (Signed)
Spoke to pt via phone to discuss new out-pt HD appt at [facility] at d/c. Clinic gave a TTS appt and pt is wanting a MWF appt. Message left at clinic requesting a return call to discuss the above further. Pt also voiced concerns about transportation to/from HD at d/c. Contacted TOC staff with pt's transportation concerns. Will assist as needed.   Olivia Canter Renal Navigator [phone]

## 2023-02-10 NOTE — Progress Notes (Signed)
[facility] Progress Note   Subjective:   Patient seen and examined at bedside.  Tolerated dialysis well this AM.  Wound vac removed yesterday.  Pain currently well controlled.  Denies CP, SOB, abdominal pain and n/v/d.  Discussed plan to return to [facility] for 12:10 chair time.  Patient reports she will need transportation arranged other than [facility] because her wheelchair did not fit on their van.   Objective Vitals:   02/10/23 0542 02/10/23 0607 02/10/23 0612 02/10/23 0850  BP: 121/64 (!) 116/58 (!) 115/58 (!) 110/47  Pulse: 79 88 85 84  Resp: (!) 23 19 (!) 23 16  Temp:   98.6 F (37 C) (!) 97.4 F (36.3 C)  TempSrc:   Oral   SpO2: 100% 100% 99% 98%  Weight:      Height:       Physical Exam General:chronically ill appearing female in NAD Heart:RRR, no mrg Lungs:CTAB, nml WOB on RA Abdomen:soft, NTND Extremities:R stump dressed, trace LLE edema Dialysis Access: [facility]   Filed Weights   02/02/23 0853 02/05/23 1520 02/05/23 1759  Weight: 113.4 kg 124.2 kg 125.1 kg    Intake/Output Summary (Last 24 hours) at 02/10/2023 0939 Last data filed at 02/09/2023 1700 Gross per 24 hour  Intake 720 ml  Output 0 ml  Net 720 ml    Additional Objective Labs: Basic Metabolic Panel: Recent Labs  Lab 02/04/23 0446 02/05/23 0541 02/06/23 1757 02/10/23 0225  NA 138 135 135 142  K 3.7 4.0 3.4* 3.0*  CL 106 104 101 109  CO2 23 19* 23 20*  GLUCOSE 109* 148* 160* 203*  BUN 24* 31* 25* 37*  CREATININE 3.95* 5.17* 4.81* 4.88*  CALCIUM 8.4* 8.8* 8.4* 7.8*  PHOS 3.1 3.8  --  5.5*   Liver Function Tests: Recent Labs  Lab 02/05/23 0541 02/06/23 1757 02/10/23 0225  AST  --  12*  --   ALT  --  <5  --   ALKPHOS  --  93  --   BILITOT  --  0.6  --   PROT  --  6.3*  --   ALBUMIN 2.3* 2.1* 1.7*   CBC: Recent Labs  Lab 02/06/23 1757 02/06/23 1757 02/07/23 0423 02/08/23 0425 02/09/23 0422 02/10/23 0225  WBC 16.6*  --  16.8* 17.1* 15.1* 14.8*  NEUTROABS  --    < >  11.3* 11.5* 10.2* 10.6*  HGB 9.4*  --  8.9* 7.8* 7.8* 7.7*  HCT 30.1*  --  29.0* 25.5* 24.9* 25.9*  MCV 93.8  --  94.8 94.1 93.3 94.5  PLT 313  --  346 324 332 353   < > = values in this interval not displayed.   CBG: Recent Labs  Lab 02/09/23 0837 02/09/23 1222 02/09/23 1640 02/09/23 2150 02/10/23 0852  GLUCAP 163* 122* 196* 215* 143*    Medications:  cefTAZidime (FORTAZ)  IV 0.5 g (02/10/23 0102)   vancomycin Stopped (02/10/23 0700)    sodium chloride   Intravenous Once   sodium chloride   Intravenous Once   aluminum-petrolatum-zinc  1 Application Topical TID   busPIRone  5 mg Oral TID   clonazepam  0.5 mg Oral BID   darbepoetin (ARANESP) injection - DIALYSIS  100 mcg Subcutaneous Q Sat-1800   feeding supplement (NEPRO CARB STEADY)  237 mL Oral Q24H   ferric citrate  210 mg Oral TID WC   heparin  5,000 Units Subcutaneous Q8H    HYDROmorphone (DILAUDID) injection  1 mg  Intravenous Once   insulin aspart  0-9 Units Subcutaneous TID WC   metoprolol tartrate  50 mg Oral BID   metroNIDAZOLE  500 mg Oral Q12H   midodrine  10 mg Oral TID WC   mirtazapine  15 mg Oral QHS   multivitamin  1 tablet Oral QHS   nutrition supplement (JUVEN)  1 packet Oral BID BM   oxyCODONE  10 mg Oral Q12H   pantoprazole  40 mg Oral BID   polyethylene glycol  17 g Oral BID   senna-docusate  2 tablet Oral QHS   zinc sulfate (50mg  elemental zinc)  220 mg Oral Daily    Dialysis Orders: MWF NW  Going to start back at [facility] on d/c  4h  400/800  121kg   3K/2.5Ca bath   TDC   Heparin none - last OP HD 11/18, post wt 124.6kg  - has been usually reaching dry wt - missed 2 sessions in last 3 wks 11/20 and 11/13 - was in hospital 10/9- 11/04 here  - dry wt lowered 6 kg 2 wks ago - venofer 100mg  q hd thru 12/04 - mircera 100 mcg q 2 wks, last 10/5, due 10/19     Assessment/Plan: Sepsis - presenting w/ SIRS, due to dehisced [name]. IV abx started and ortho consulted. S/p bka revision 11/27. ID  consulted. Recommend ABX for 2weeks - Vanc & Fortaz with HD. Per primary  [name] - MRI of L foot cancelled. Likely w/osteo. Dr. Lajoyce Corners following. ID recommending Doxy 100mg  BID and Augmentin 500/125mg  every day. ESKD - on HD MWF. Completed HD overnight.  Plan to resume 2nd shift schedule at [facility] on d/c.  Reports previous non compliance due to timing in the AM.  Discussed timing hard to dictate in hospital due to patient load/acuity. As outpatient only certain time slots are available, a request can be placed with outpatient staff for later time if one becomes available but latest on time are typically around 1pm.  Continue to counsel on importance of compliance.    HTN - BP in goal. On metoprolol 50mg  BID and midodrine 10mg  pre HD.  Cont meds as needed.  Volume - no gross vol excess on exam.  UF as tolerated.  Anemia of eskd - hgb 7.7. On aranesp qwk, last 11/30. MBD ckd - CCa in range. Phos at goal. Continue binders.  DM2- per primary Nutrition - Carb modified diet w/fluid restrictions. Give protein supplements. Renal Vit GOC - appreciate palliative care assistance. Wants to remain full code and continue dialysis.   Virgina Norfolk, PA-C [facility] 02/10/2023,9:39 AM  LOS: 12 days

## 2023-02-10 NOTE — Progress Notes (Signed)
Received patient in bed to unit.  Alert and oriented.  Informed consent signed and in chart.   TX duration: 3:17  Patient tolerated well.  Transported back to the room  Alert, without acute distress.  Hand-off given to patient's nurse.   Access used: RIJ TDC Access issues: None  Total UF removed: 3000 mL Medication(s) given: Vancomycin 1000 mg Post HD VS: please see data insert    02/10/23 0612  Vitals  Temp 98.6 F (37 C)  Temp Source Oral  BP (!) 115/58  MAP (mmHg) 74  BP Location Right Wrist  BP Method Automatic  Patient Position (if appropriate) Lying  Pulse Rate 85  Pulse Rate Source Monitor  ECG Heart Rate 87  Resp (!) 23  Oxygen Therapy  SpO2 99 %  O2 Device Room Air  Patient Activity (if Appropriate) In bed  Pulse Oximetry Type Continuous  Note  Patient Observations Patient awakened from sleep, no acute distress noted; patient condition stable for return transport.  Hemodialysis Catheter Right Subclavian Double lumen Permanent (Tunneled)  Placement Date/Time: 06/24/22 1644   Serial / Lot #: [id number]  Expiration Date: 09/08/26  Time Out: Correct patient;Correct site;Correct procedure  Maximum sterile barrier precautions: Hand hygiene;Mask;Cap;Sterile gloves;Sterile gown;Large sterile ...  Site Condition No complications  Blue Lumen Status Flushed;Heparin locked;Dead end cap in place  Red Lumen Status Flushed;Heparin locked;Dead end cap in place  Purple Lumen Status N/A  Catheter fill solution Heparin 1000 units/ml  Catheter fill volume (Arterial) 1.9 cc  Catheter fill volume (Venous) 1.9  Dressing Type Transparent  Dressing Status Antimicrobial disc in place;Clean, Dry, Intact  Drainage Description None  Post treatment catheter status Capped and Clamped      [facility]

## 2023-02-10 NOTE — Progress Notes (Signed)
Daily Progress Note   Patient Name: Deborah [name]       Date: 02/10/2023 DOB: [birth year]  Age: 57 y.o. MRN#: [id number] Attending Physician: Leroy Sea, MD Primary Care Physician: Pcp, No Admit Date: 01/29/2023 Length of Stay: 12 days  Reason for Consultation/Follow-up: Establishing goals of care  HPI/Patient Profile:  57 y.o. female  with past medical history of ESRD on HD MWF, PAF-not on any anticoagulation due to recent GI bleeding, HFpEF, DM-2, HTN, chronic pain syndrome on narcotics-who underwent BKA 10/23-presented with a right BKA stump dehiscence.   Since admission the patient has been non-compliant with iHD and stated to a couple providers about not wanting to do dialysis anymore.  PMT was consulted for GOC conversations.  Subjective:   Subjective: Chart Reviewed. Updates received. Patient Assessed. Created space and opportunity for patient  and family to explore thoughts and feelings regarding current medical situation.  Today's Discussion: Today saw the patient at the bedside.  She was reclining back in the bed and intermittently sitting up on edge of the bed and says she is waiting on a heated blanket.  I went to the nurses station and brought to heated blankets back with me to help her feel more comfortable.    We discussed that she did have dialysis last night.  She did not get along with the dialysis tech that was in the dialysis center.  We discussed that social work is contacted [employer] and they have arranged for her to have a chair time back at the [facility] location where she prefers to go.  The time is scheduled for 1210 which is close to the latest appointment they have (1:00).  The patient seems agreeable to this.  We discussed that my understanding is her goals are to continue hemodialysis at this point.  She states that she is still not at 100% sure.  She states that she would like to talk to hospice to see what they can offer with her processes to  help in her decision making.  I offered to reach out to social work and asked them to put in a referral for hospice and she has agreed.  I discussed that we would be here to assist with this conversation and I would ask a colleague to follow-up in a couple days to help her in her decision making.  I recommended that if she does not elect hospice I would highly recommend palliative care to follow as they can help with these discussions in the future should they be needed.  She is agreeable.  I provided emotional and general support through therapeutic listening, empathy, sharing of stories, therapeutic touch, and other techniques. I answered all questions and addressed all concerns to the best of my ability.  Review of Systems  Constitutional:        Denies pain in general  Respiratory:  Negative for chest tightness and shortness of breath.   Cardiovascular:  Negative for chest pain.  Gastrointestinal:  Negative for abdominal pain, nausea and vomiting.    Objective:   Vital Signs:  BP (!) 98/50 (BP Location: Right Wrist)   Pulse 84   Temp 97.7 F (36.5 C) (Oral)   Resp 16   Ht 5\' 6"  (1.676 m)   Wt 125.1 kg   LMP 10/10/2012   SpO2 98%   BMI 44.51 kg/m   Physical Exam Vitals and nursing note reviewed.  Constitutional:      General: She is not in acute distress.  Appearance: She is obese. She is ill-appearing. She is not toxic-appearing.  HENT:     Head: Normocephalic and atraumatic.  Pulmonary:     Effort: Pulmonary effort is normal. No respiratory distress.  Musculoskeletal:     Comments: left heel with dressing on     Right Lower Extremity: Right leg is amputated below knee.  Skin:    General: Skin is warm and dry.  Neurological:     General: No focal deficit present.     Mental Status: She is alert.  Psychiatric:        Mood and Affect: Mood normal.        Behavior: Behavior normal.     Palliative Assessment/Data: 50%    Existing Vynca/ACP  Documentation: None  Assessment & Plan:   Impression: Present on Admission:  Dehiscence of amputation stump of right lower extremity (HCC)  57 year old female with acute presentation and chronic comorbidities as described above.  The patient is currently admitted with BKA stump infection/dehiscence, ESRD on hemodialysis.  She has been declining hemodialysis over the past couple days and is stated that she does not want to continue.  We had an extensive discussion today and she is in agreement to continue hemodialysis.  She understands that at any point that she chooses not to continue dialysis that is a possible option.  She is requesting dialysis to be completed later in the afternoon.  She is also needing assistance with outpatient transportation to dialysis.  Spiritual care is seeing the patient for [facility] education and the patient is to call them when she is ready to complete paperwork.  She is requested to see somebody from hospice to understand what they can offer to help in her decision making.  I have recommended palliative care in the outpatient setting, at very least, at discharge.  Overall prognosis guarded to poor.  SUMMARY OF RECOMMENDATIONS   Remain full code Full scope of treatment for now Continue hemodialysis at this time Appreciate social work extensive effort on outpatient dialysis and transportation Continue spiritual care for [facility] completion [facility] consult for referral to hospice liaison to discuss his options Palliative medicine will follow-up in a couple days to check on her decision making process  Symptom Management:  Per primary team PMT is available to assist as needed  Code Status: Full code  Prognosis: Unable to determine  Discharge Planning: To Be Determined  Discussed with: Patient, medical team, nursing team, [facility]  Thank you for allowing [place] to participate in the care of Deborah [name] PMT will continue to support holistically.  Time Total: 45  min  Detailed review of medical records (labs, imaging, vital signs), medically appropriate exam, discussed with treatment team, counseling and education to patient, family, & staff, documenting clinical information, medication management, coordination of care  Wynne Dust, NP Palliative Medicine Team  Team Phone # [phone] (Nights/Weekends)  11/04/2020, 8:17 AM

## 2023-02-10 NOTE — TOC Progression Note (Signed)
Transition of Care [facility]) - Progression Note    Patient Details  Name: Deborah [name] MRN: [id number] Date of Birth: Jun 19, 1965  Transition of Care [facility]) CM/SW Contact  Mearl Latin, LCSW Phone Number: 02/10/2023, 3:05 PM  Clinical Narrative:    CSW received request for Hospice to meet with patient to answer questions. CSW requested liaison from [facility] meet with patient as able.    Expected Discharge Plan: Home w Home Health Services Barriers to Discharge: Continued Medical Work up  Expected Discharge Plan and Services In-house Referral: Hospice / Palliative Care   Post Acute Care Choice: Home Health Living arrangements for the past 2 months: Apartment                                       Social Determinants of Health (SDOH) Interventions SDOH Screenings   Food Insecurity: No Food Insecurity (01/30/2023)  Recent Concern: Food Insecurity - High Risk (01/25/2023)   Received from [facility]  Housing: Patient Declined (01/30/2023)  Transportation Needs: No Transportation Needs (01/30/2023)  Recent Concern: Transportation Needs - Unmet Transportation Needs (01/25/2023)   Received from [facility]  Utilities: Not At Risk (01/30/2023)  Recent Concern: Utilities - Medium Risk (01/25/2023)   Received from [facility]  Depression (PHQ2-9): High Risk (11/18/2022)  Financial Resource Strain: Low Risk (03/23/2021)   Received from [facility])  Physical Activity: Not on File (10/31/2017)   Received from [place], [place]  Social Connections: Unknown (07/19/2021)   Received from [facility], [facility]  Stress: Low Risk (03/23/2021)   [facility] from [facility])  Tobacco Use: Low Risk  (02/02/2023)   Received from [facility]    Readmission Risk Interventions    11/25/2022    5:16 PM 07/05/2022    1:20 PM  Readmission Risk Prevention Plan  Transportation Screening Complete Complete   Medication Review (RN Care Manager) Complete Referral to Pharmacy  PCP or Specialist appointment within 3-5 days of discharge Complete Complete  HRI or Home Care Consult Complete Complete  SW Recovery Care/Counseling Consult Complete Complete  Palliative Care Screening Not Applicable Not Applicable  Skilled Nursing Facility Not Applicable Not Applicable

## 2023-02-11 ENCOUNTER — Inpatient Hospital Stay (HOSPITAL_COMMUNITY): Payer: 59

## 2023-02-11 DIAGNOSIS — T8781 Dehiscence of amputation stump: Secondary | ICD-10-CM | POA: Diagnosis not present

## 2023-02-11 LAB — CBC WITH DIFFERENTIAL/PLATELET
Abs Immature Granulocytes: 0.18 10*3/uL — ABNORMAL HIGH (ref 0.00–0.07)
Basophils Absolute: 0.1 10*3/uL (ref 0.0–0.1)
Basophils Relative: 1 %
Eosinophils Absolute: 0.7 10*3/uL — ABNORMAL HIGH (ref 0.0–0.5)
Eosinophils Relative: 5 %
HCT: 27.4 % — ABNORMAL LOW (ref 36.0–46.0)
Hemoglobin: 8.4 g/dL — ABNORMAL LOW (ref 12.0–15.0)
Immature Granulocytes: 1 %
Lymphocytes Relative: 20 %
Lymphs Abs: 3.2 10*3/uL (ref 0.7–4.0)
MCH: 28.9 pg (ref 26.0–34.0)
MCHC: 30.7 g/dL (ref 30.0–36.0)
MCV: 94.2 fL (ref 80.0–100.0)
Monocytes Absolute: 0.9 10*3/uL (ref 0.1–1.0)
Monocytes Relative: 6 %
Neutro Abs: 10.6 10*3/uL — ABNORMAL HIGH (ref 1.7–7.7)
Neutrophils Relative %: 67 %
Platelets: 339 10*3/uL (ref 150–400)
RBC: 2.91 MIL/uL — ABNORMAL LOW (ref 3.87–5.11)
RDW: 15.9 % — ABNORMAL HIGH (ref 11.5–15.5)
WBC: 15.7 10*3/uL — ABNORMAL HIGH (ref 4.0–10.5)
nRBC: 0 % (ref 0.0–0.2)

## 2023-02-11 LAB — C-REACTIVE PROTEIN: CRP: 5.9 mg/dL — ABNORMAL HIGH (ref <1.0)

## 2023-02-11 LAB — GLUCOSE, CAPILLARY
Glucose-Capillary: 171 mg/dL — ABNORMAL HIGH (ref 70–99)
Glucose-Capillary: 216 mg/dL — ABNORMAL HIGH (ref 70–99)

## 2023-02-11 MED ORDER — VANCOMYCIN HCL IN DEXTROSE 1-5 GM/200ML-% IV SOLN: 1000.0000 mg | INTRAVENOUS | Status: DC

## 2023-02-11 MED ORDER — HYDROMORPHONE HCL 1 MG/ML IJ SOLN
1.0000 mg | Freq: Once | INTRAMUSCULAR | Status: AC | PRN
  Administered 2023-02-11: 1 mg via INTRAVENOUS
  Filled 2023-02-11: qty 1

## 2023-02-11 NOTE — Progress Notes (Signed)
[facility] Progress Note   Subjective:    Seen and examined patient at bedside. Sleeping and appears comfortable. Responsive. Denies SOB, CP, and N/V. Plan for HD this afternoon. Apparently, [facility] doesn't have a MWF chair available and only TTS chair in outpatient at discharge.   Objective Vitals:   02/10/23 1826 02/10/23 2034 02/11/23 0118 02/11/23 0407  BP: (!) 116/56 113/69  (!) 111/58  Pulse: 91     Resp: 20     Temp: 97.6 F (36.4 C) 97.6 F (36.4 C) 97.9 F (36.6 C) 98 F (36.7 C)  TempSrc: Oral Oral Oral Oral  SpO2: 100%     Weight:      Height:       Physical Exam General:chronically ill appearing female in NAD Heart:RRR, no mrg Lungs:CTAB, nml WOB on RA Abdomen:soft, NTND Extremities:R stump dressed, trace LLE edema Dialysis Access: [facility]   Filed Weights   02/02/23 0853 02/05/23 1520 02/05/23 1759  Weight: 113.4 kg 124.2 kg 125.1 kg   No intake or output data in the 24 hours ending 02/11/23 1030  Additional Objective Labs: Basic Metabolic Panel: Recent Labs  Lab 02/05/23 0541 02/06/23 1757 02/10/23 0225  NA 135 135 142  K 4.0 3.4* 3.0*  CL 104 101 109  CO2 19* 23 20*  GLUCOSE 148* 160* 203*  BUN 31* 25* 37*  CREATININE 5.17* 4.81* 4.88*  CALCIUM 8.8* 8.4* 7.8*  PHOS 3.8  --  5.5*   Liver Function Tests: Recent Labs  Lab 02/05/23 0541 02/06/23 1757 02/10/23 0225  AST  --  12*  --   ALT  --  <5  --   ALKPHOS  --  93  --   BILITOT  --  0.6  --   PROT  --  6.3*  --   ALBUMIN 2.3* 2.1* 1.7*   No results for input(s): "LIPASE", "AMYLASE" in the last 168 hours. CBC: Recent Labs  Lab 02/06/23 1757 02/06/23 1757 02/07/23 0423 02/08/23 0425 02/09/23 0422 02/10/23 0225  WBC 16.6*  --  16.8* 17.1* 15.1* 14.8*  NEUTROABS  --    < > 11.3* 11.5* 10.2* 10.6*  HGB 9.4*  --  8.9* 7.8* 7.8* 7.7*  HCT 30.1*  --  29.0* 25.5* 24.9* 25.9*  MCV 93.8  --  94.8 94.1 93.3 94.5  PLT 313  --  346 324 332 353   < > = values in this interval  not displayed.   Blood Culture    Component Value Date/Time   SDES BLOOD RIGHT HAND 01/30/2023 0436   SPECREQUEST  01/30/2023 0436    BOTTLES DRAWN AEROBIC ONLY Blood Culture results may not be optimal due to an inadequate volume of blood received in culture bottles   CULT  01/30/2023 0436    NO GROWTH 5 DAYS Performed at [facility], [address], [place], [place] [address]    REPTSTATUS 02/04/2023 FINAL 01/30/2023 0436    Cardiac Enzymes: No results for input(s): "CKTOTAL", "CKMB", "CKMBINDEX", "TROPONINI" in the last 168 hours. CBG: Recent Labs  Lab 02/09/23 2150 02/10/23 0852 02/10/23 1225 02/10/23 1816 02/10/23 2149  GLUCAP 215* 143* 186* 197* 163*   Iron Studies: No results for input(s): "IRON", "TIBC", "TRANSFERRIN", "FERRITIN" in the last 72 hours. Lab Results  Component Value Date   INR 1.9 (H) 12/15/2022   INR 1.8 (H) 11/23/2022   INR 1.1 10/08/2020   Studies/Results: MR FOOT LEFT WO CONTRAST  Result Date: 02/10/2023 CLINICAL DATA:  Chronic  foot pain EXAM: MRI OF THE LEFT FOOT WITHOUT CONTRAST TECHNIQUE: Multiplanar, multisequence MR imaging of the left forefoot was performed. No intravenous contrast was administered. COMPARISON:  02/07/2023 FINDINGS: Despite efforts by the technologist and patient, motion artifact is present on today's exam and could not be eliminated. This reduces exam sensitivity and specificity. Bones/Joint/Cartilage No marrow edema in the forefoot to suggest osteomyelitis. Spurring of the first metatarsal head noted with a 4 mm degenerative subcortical cystic lesion along the central articular surface of the first metatarsal head on image 9 series 6. First digit sesamoids unremarkable. No definite erosion observed. Mild degenerative findings including loss of articular space and spurring at the Lisfranc joint. Ligaments The Lisfranc ligament appears intact. No definite turf toe or plantar plate injury identified. Muscles and Tendons  Moderate regional muscular atrophy. Soft tissues Subcutaneous edema along the dorsum of the foot and extending into the toes. There is lateral subcutaneous edema at the level of the fifth MTP joint as well as subcutaneous edema along the ball of the foot also extending into the toes. IMPRESSION: 1. No marrow edema in the forefoot to suggest osteomyelitis. 2. Subcutaneous edema along the dorsum of the foot and extending into the toes, and also along the ball of the foot. 3. Moderate regional muscular atrophy. 4. Mild degenerative findings at the Lisfranc joint and first metatarsophalangeal joint. Electronically Signed   By: Gaylyn Rong M.D.   On: 02/10/2023 18:59    Medications:  cefTAZidime (FORTAZ)  IV 0.5 g (02/10/23 2201)   vancomycin Stopped (02/10/23 0700)    sodium chloride   Intravenous Once   sodium chloride   Intravenous Once   aluminum-petrolatum-zinc  1 Application Topical TID   busPIRone  5 mg Oral TID   clonazepam  0.5 mg Oral BID   darbepoetin (ARANESP) injection - DIALYSIS  100 mcg Subcutaneous Q Sat-1800   feeding supplement (NEPRO CARB STEADY)  237 mL Oral Q24H   ferric citrate  210 mg Oral TID WC   heparin  5,000 Units Subcutaneous Q8H   insulin aspart  0-9 Units Subcutaneous TID WC   metoprolol tartrate  50 mg Oral BID   metroNIDAZOLE  500 mg Oral Q12H   midodrine  10 mg Oral TID WC   mirtazapine  15 mg Oral QHS   multivitamin  1 tablet Oral QHS   nutrition supplement (JUVEN)  1 packet Oral BID BM   oxyCODONE  10 mg Oral Q12H   pantoprazole  40 mg Oral BID   zinc sulfate (50mg  elemental zinc)  220 mg Oral Daily    Dialysis Orders: MWF [facility]  Going to start back at [facility] on d/c TTS schedule as of now  4h  400/800  121kg   3K/2.5Ca bath   TDC   Heparin none - last OP HD 11/18, post wt 124.6kg  - has been usually reaching dry wt - missed 2 sessions in last 3 wks 11/20 and 11/13 - was in hospital 10/9- 11/04 here  - dry wt lowered 6 kg 2 wks ago - venofer 100mg  q  hd thru 12/04 - mircera 100 mcg q 2 wks, last 10/5, due 10/19  Assessment/Plan: Sepsis - presenting w/ SIRS, due to dehisced [name]. IV abx started and ortho consulted. S/p bka revision 11/27. ID consulted. Recommend ABX for 2weeks - Vanc & Fortaz with HD. Per primary  [name] - MRI of L foot cancelled. Likely w/osteo. Dr. Lajoyce Corners following. ID recommending Doxy 100mg  BID  and Augmentin 500/125mg  every day. ESKD - on HD MWF.  Plan to resume 2nd shift schedule at [facility] TTS on d/c.  Per Renal Navigator's recent note, MWF schedule is not available. Reports previous non compliance due to timing in the AM.  Discussed timing hard to dictate in hospital due to patient load/acuity. As outpatient only certain time slots are available, a request can be placed with outpatient staff for later time if one becomes available but latest on time are typically around 1pm.  Continue to counsel on importance of compliance.    HTN - BP in goal. On metoprolol 50mg  BID and midodrine 10mg  pre HD.  Cont meds as needed.  Volume - no gross vol excess on exam.  UF as tolerated.  Anemia of eskd - hgb 7.7. On aranesp qwk, last 11/30. MBD ckd - CCa in range. Phos at goal. Continue binders.  DM2- per primary Nutrition - Carb modified diet w/fluid restrictions. Give protein supplements. Renal Vit GOC - appreciate palliative care assistance. Wants to remain full code and continue dialysis.   Salome Holmes, NP [facility] 02/11/2023,10:30 AM  LOS: 13 days

## 2023-02-11 NOTE — Progress Notes (Signed)
Spoke with patient's bedside nurse. Patient refused to come to dialysis this morning but reports she is willing to come later this evening. Charge nurse notified.

## 2023-02-11 NOTE — Progress Notes (Signed)
PROGRESS NOTE  Deborah [name] KZS:[id number] DOB: [birth year] DOA: 01/29/2023 PCP: Pcp, No   LOS: 13 days   Brief Narrative / Interim history: Patient is a 57 y.o.  female with history of ESRD on HD MWF, PAF-not on any anticoagulation due to recent GI bleeding, HFpEF, DM-2, HTN, chronic pain syndrome on narcotics-who underwent BKA 10/23-presented with a right BKA stump dehiscence.   Subjective -patient in bed sleeping comfortably woken up, requested to get some extra Dilaudid which was politely refused as she was still sleeping and in no distress, she also informed me, she also refused HD treatment early this morning as she wanted to catch more sleep, also refused MRI ankle after undergoing MRI of the foot yesterday.  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 Lajoyce Corners 11/27.  Continue wound VAC.  Left heel ulcer -with likely osteomyelitis and some surrounding acute cellulitis, present on admission, Dr. Lajoyce Corners following, on IV antibiotics, MRI pending, patient refused on 02/07/2023 and 02/08/2023, counseled again may get it done on 02/09/2023.  Follow cultures.  Seen by ID per ID following antibiotic regimen.  Vanc + Ceftaz + oral Metronidazole x 2 weeks (EOT 12/17)  Then would start Doxy 100 mg bid + Augmentin 500/125 mg at bedtime in an attempt to suppress until surgical management can be offered.    MRI of the foot does not show any osteomyelitis we still await MRI of the ankle, patient has refused studies and HD treatment multiple times, counseled multiple times, very noncompliant and somewhat manipulative, exhibits ongoing narcotic seeking behavior.  MRI of the foot discussed with Dr. Lajoyce Corners 02/11/2023.      Some narcotic seeking behavior with history of chronic pain.  Definitely  exhibiting some narcotic seeking behavior, counseled multiple times against overuse, cautiously use narcotics for acute pain with some Narcan on board.    ESRD on HD-nephrology consulted, noncompliant, refusing to go to dialysis on 02/07/2023 again on 02/11/2023 - counseled.  Hypokalemia-replace as indicated, continue to monitor  Generalized body tremors /jerking movement-on 11/25, no recurrence since then.  Neurology briefly evaluated, appears to be functional  History of GI bleed October 2024-thought to be due to AVMs based on the EGD.  No longer an issue, hemoglobin overall stable  History of [place]-supportive care, she would benefit from weight loss  PAF-not on anticoagulation due to recent GI bleed.  Continue metoprolol  Hypokalemia.  Dialysis patient, low-dose potassium supplementation.    Chronic diastolic CHF, EF 73% on recent echo-volume management per HD, compensated  Obesity, morbid-BMI 40.3.  She would benefit from goals  Depression/anxiety/panic attacks- continue  BuSpar  History of tardive dyskinesia- supportive care  History of malignant duodenal carcinoid-status postresection in the past  GOC -patient did mention to me this morning that she is thinking about stopping dialysis as her quality of life is quite poor.  She mentioned the same to Dr. Thedore Mins with nephrology and she is thinking about hospice, but for now wants to continue full medical treatment  DM2 -controlled  Lab Results  Component Value Date   HGBA1C 5.7 (H) 02/04/2023   CBG (last 3)  Recent Labs    02/10/23 1225 02/10/23 1816 02/10/23 2149  GLUCAP 186* 197* 163*   Right IJ hemodialysis catheter present.  Scheduled Meds:  sodium chloride   Intravenous Once  sodium chloride   Intravenous Once   aluminum-petrolatum-zinc  1 Application Topical TID   busPIRone  5 mg Oral TID   clonazepam  0.5 mg Oral BID   darbepoetin (ARANESP) injection - DIALYSIS  100 mcg Subcutaneous Q Sat-1800   feeding  supplement (NEPRO CARB STEADY)  237 mL Oral Q24H   ferric citrate  210 mg Oral TID WC   heparin  5,000 Units Subcutaneous Q8H   insulin aspart  0-9 Units Subcutaneous TID WC   metoprolol tartrate  50 mg Oral BID   metroNIDAZOLE  500 mg Oral Q12H   midodrine  10 mg Oral TID WC   mirtazapine  15 mg Oral QHS   multivitamin  1 tablet Oral QHS   nutrition supplement (JUVEN)  1 packet Oral BID BM   oxyCODONE  10 mg Oral Q12H   pantoprazole  40 mg Oral BID   zinc sulfate (50mg  elemental zinc)  220 mg Oral Daily   Continuous Infusions:  cefTAZidime (FORTAZ)  IV 0.5 g (02/10/23 2201)   vancomycin Stopped (02/10/23 0700)   PRN Meds:.acetaminophen, bisacodyl, guaiFENesin-dextromethorphan, hydrALAZINE, HYDROmorphone (DILAUDID) injection, HYDROmorphone (DILAUDID) injection, labetalol, loperamide, LORazepam, melatonin, methocarbamol, metoprolol tartrate, naLOXone (NARCAN)  injection, [DISCONTINUED] ondansetron **OR** ondansetron (ZOFRAN) IV, mouth rinse, phenol, polyethylene glycol, senna-docusate   Diet Orders (From admission, onward)     Start     Ordered   02/07/23 1008  Diet Carb Modified Fluid consistency: Thin; Room service appropriate? Yes; Fluid restriction: 1500 mL Fluid  Diet effective now       Question Answer Comment  Diet-HS Snack? Nothing   Calorie Level Medium 1600-2000   Fluid consistency: Thin   Room service appropriate? Yes   Fluid restriction: 1500 mL Fluid      02/07/23 1007            DVT prophylaxis: SCD's Start: 02/02/23 1640 heparin injection 5,000 Units Start: 01/29/23 2200   Lab Results  Component Value Date   PLT 353 02/10/2023      Code Status: Full Code  Family Communication: Significant other at bedside  Status is: Inpatient Remains inpatient appropriate because: Severity of illness  Level of care: Progressive  Consultants:  Nephrology Orthopedic surgery  Objective: Vitals:   02/10/23 1826 02/10/23 2034 02/11/23 0118 02/11/23 0407  BP:  (!) 116/56 113/69  (!) 111/58  Pulse: 91     Resp: 20     Temp: 97.6 F (36.4 C) 97.6 F (36.4 C) 97.9 F (36.6 C) 98 F (36.7 C)  TempSrc: Oral Oral Oral Oral  SpO2: 100%     Weight:      Height:       No intake or output data in the 24 hours ending 02/11/23 0733   Wt Readings from Last 3 Encounters:  02/05/23 125.1 kg  01/08/23 121.2 kg  11/28/22 127.5 kg    Examination:  Patient in bed sleeping comfortably woken up, no focal deficits [place].AT,PERRAL Supple Neck, No JVD,   Symmetrical Chest wall movement, Good air movement bilaterally, CTAB RRR,No Gallops, Rubs or new Murmurs,  +ve B.Sounds, Abd Soft, No tenderness,   Right BKA stump under bandage with wound VAC, left ankle under bandage, right IJ hemodialysis catheter   Data Reviewed: I have independently reviewed following labs and imaging studies   Recent Labs  Lab 02/06/23 1757 02/07/23 0423 02/08/23 0425 02/09/23 0422 02/10/23 0225  WBC 16.6* 16.8* 17.1* 15.1* 14.8*  HGB 9.4* 8.9* 7.8* 7.8* 7.7*  HCT 30.1* 29.0* 25.5*  24.9* 25.9*  PLT 313 346 324 332 353  MCV 93.8 94.8 94.1 93.3 94.5  MCH 29.3 29.1 28.8 29.2 28.1  MCHC 31.2 30.7 30.6 31.3 29.7*  RDW 14.9 14.9 15.2 15.2 15.7*  LYMPHSABS  --  3.7 3.6 3.0 2.3  MONOABS  --  1.1* 1.3* 1.2* 1.0  EOSABS  --  0.5 0.5 0.5 0.5  BASOSABS  --  0.1 0.1 0.1 0.1    Recent Labs  Lab 02/04/23 1955 02/05/23 0541 02/06/23 1757 02/07/23 0423 02/08/23 0425 02/09/23 0422 02/10/23 0225 02/10/23 0747  NA  --  135 135  --   --   --  142  --   K  --  4.0 3.4*  --   --   --  3.0*  --   CL  --  104 101  --   --   --  109  --   CO2  --  19* 23  --   --   --  20*  --   ANIONGAP  --  12 11  --   --   --  13  --   GLUCOSE  --  148* 160*  --   --   --  203*  --   BUN  --  31* 25*  --   --   --  37*  --   CREATININE  --  5.17* 4.81*  --   --   --  4.88*  --   AST  --   --  12*  --   --   --   --   --   ALT  --   --  <5  --   --   --   --   --   ALKPHOS  --   --  93  --    --   --   --   --   BILITOT  --   --  0.6  --   --   --   --   --   ALBUMIN  --  2.3* 2.1*  --   --   --  1.7*  --   CRP  --   --   --  7.1* 8.0* 8.2*  --  8.0*  PROCALCITON  --   --   --  0.53 0.63  --   --   --   HGBA1C 5.7*  --   --   --   --   --   --   --   MG  --   --  1.5*  --   --   --   --   --   CALCIUM  --  8.8* 8.4*  --   --   --  7.8*  --     Lab Results  Component Value Date   CHOL 104 07/22/2019   HDL 30 (L) 07/22/2019   LDLCALC 37 07/22/2019   TRIG 185 (H) 07/22/2019   CHOLHDL 3.5 07/22/2019      Recent Labs  Lab 02/04/23 1955 02/05/23 0541 02/06/23 1757 02/07/23 0423 02/08/23 0425 02/09/23 0422 02/10/23 0225 02/10/23 0747  CRP  --   --   --  7.1* 8.0* 8.2*  --  8.0*  PROCALCITON  --   --   --  0.53 0.63  --   --   --   HGBA1C 5.7*  --   --   --   --   --   --   --  MG  --   --  1.5*  --   --   --   --   --   CALCIUM  --  8.8* 8.4*  --   --   --  7.8*  --       Recent Results (from the past 240 hour(s))  MRSA Next Gen by PCR, Nasal     Status: None   Collection Time: 02/01/23 10:57 PM   Specimen: Nasal Mucosa; Nasal Swab  Result Value Ref Range Status   MRSA by PCR Next Gen NOT DETECTED NOT DETECTED Final    Comment: (NOTE) The GeneXpert MRSA Assay (FDA approved for NASAL specimens only), is one component of a comprehensive MRSA colonization surveillance program. It is not intended to diagnose MRSA infection nor to guide or monitor treatment for MRSA infections. Test performance is not FDA approved in patients less than 30 years old. Performed at [facility], [address] [place], [place] [address]   MRSA Next Gen by PCR, Nasal     Status: None   Collection Time: 02/06/23  6:05 AM   Specimen: Nasal Mucosa; Nasal Swab  Result Value Ref Range Status   MRSA by PCR Next Gen NOT DETECTED NOT DETECTED Final    Comment: (NOTE) The GeneXpert MRSA Assay (FDA approved for NASAL specimens only), is one component of a comprehensive MRSA  colonization surveillance program. It is not intended to diagnose MRSA infection nor to guide or monitor treatment for MRSA infections. Test performance is not FDA approved in patients less than 3 years old. Performed at [facility], [address], [place], [place] [address]      Radiology Studies: MR FOOT LEFT WO CONTRAST  Result Date: 02/10/2023 CLINICAL DATA:  Chronic foot pain EXAM: MRI OF THE LEFT FOOT WITHOUT CONTRAST TECHNIQUE: Multiplanar, multisequence MR imaging of the left forefoot was performed. No intravenous contrast was administered. COMPARISON:  02/07/2023 FINDINGS: Despite efforts by the technologist and patient, motion artifact is present on today's exam and could not be eliminated. This reduces exam sensitivity and specificity. Bones/Joint/Cartilage No marrow edema in the forefoot to suggest osteomyelitis. Spurring of the first metatarsal head noted with a 4 mm degenerative subcortical cystic lesion along the central articular surface of the first metatarsal head on image 9 series 6. First digit sesamoids unremarkable. No definite erosion observed. Mild degenerative findings including loss of articular space and spurring at the Lisfranc joint. Ligaments The Lisfranc ligament appears intact. No definite turf toe or plantar plate injury identified. Muscles and Tendons Moderate regional muscular atrophy. Soft tissues Subcutaneous edema along the dorsum of the foot and extending into the toes. There is lateral subcutaneous edema at the level of the fifth MTP joint as well as subcutaneous edema along the ball of the foot also extending into the toes. IMPRESSION: 1. No marrow edema in the forefoot to suggest osteomyelitis. 2. Subcutaneous edema along the dorsum of the foot and extending into the toes, and also along the ball of the foot. 3. Moderate regional muscular atrophy. 4. Mild degenerative findings at the Lisfranc joint and first metatarsophalangeal joint. Electronically  Signed   By: Gaylyn Rong M.D.   On: 02/10/2023 18:59     Signature  -    Susa Raring M.D on 02/11/2023 at 7:33 AM   -  To page go to [email or web address]

## 2023-02-11 NOTE — Progress Notes (Signed)
Contacted [facility] this morning to inquire if clinic has a MWF spot available instead of a TTS that was provided. Clinic currently does not have a MWF. Spoke to pt via phone. Pt advised that clinic only has a TTS at this time and that pt can be placed on a waiting list for a MWF when one becomes available. Pt voices understanding. If pt opts to continue with HD, pt will go to [facility] on TTS 12:10 chair time at d/c. Pt will need to arrive at 11:30 for first appt. Will assist as needed.   Olivia Canter Renal Navigator [phone]

## 2023-02-11 NOTE — Plan of Care (Signed)
  Problem: Education: Goal: Knowledge of General Education information will improve Description: Including pain rating scale, medication(s)/side effects and non-pharmacologic comfort measures Outcome: Progressing   Problem: Health Behavior/Discharge Planning: Goal: Ability to manage health-related needs will improve Outcome: Progressing   Problem: Clinical Measurements: Goal: Ability to maintain clinical measurements within normal limits will improve Outcome: Progressing Goal: Will remain free from infection Outcome: Progressing Goal: Diagnostic test results will improve Outcome: Progressing Goal: Respiratory complications will improve Outcome: Progressing Goal: Cardiovascular complication will be avoided Outcome: Progressing   Problem: Activity: Goal: Risk for activity intolerance will decrease Outcome: Progressing   Problem: Nutrition: Goal: Adequate nutrition will be maintained Outcome: Progressing   Problem: Coping: Goal: Level of anxiety will decrease Outcome: Progressing   Problem: Elimination: Goal: Will not experience complications related to bowel motility Outcome: Progressing Goal: Will not experience complications related to urinary retention Outcome: Progressing   Problem: Pain Management: Goal: General experience of comfort will improve Outcome: Progressing   Problem: Safety: Goal: Ability to remain free from injury will improve Outcome: Progressing   Problem: Skin Integrity: Goal: Risk for impaired skin integrity will decrease Outcome: Progressing   Problem: Education: Goal: Ability to describe self-care measures that may prevent or decrease complications (Diabetes Survival Skills Education) will improve Outcome: Progressing Goal: Individualized Educational Video(s) Outcome: Progressing   Problem: Coping: Goal: Ability to adjust to condition or change in health will improve Outcome: Progressing   Problem: Fluid Volume: Goal: Ability to  maintain a balanced intake and output will improve Outcome: Progressing   Problem: Health Behavior/Discharge Planning: Goal: Ability to identify and utilize available resources and services will improve Outcome: Progressing Goal: Ability to manage health-related needs will improve Outcome: Progressing   Problem: Metabolic: Goal: Ability to maintain appropriate glucose levels will improve Outcome: Progressing   Problem: Nutritional: Goal: Maintenance of adequate nutrition will improve Outcome: Progressing Goal: Progress toward achieving an optimal weight will improve Outcome: Progressing   Problem: Skin Integrity: Goal: Risk for impaired skin integrity will decrease Outcome: Progressing   Problem: Tissue Perfusion: Goal: Adequacy of tissue perfusion will improve Outcome: Progressing   Problem: Education: Goal: Knowledge of the prescribed therapeutic regimen will improve Outcome: Progressing Goal: Ability to verbalize activity precautions or restrictions will improve Outcome: Progressing Goal: Understanding of discharge needs will improve Outcome: Progressing   Problem: Activity: Goal: Ability to perform//tolerate increased activity and mobilize with assistive devices will improve Outcome: Progressing   Problem: Clinical Measurements: Goal: Postoperative complications will be avoided or minimized Outcome: Progressing   Problem: Self-Care: Goal: Ability to meet self-care needs will improve Outcome: Progressing   Problem: Self-Concept: Goal: Ability to maintain and perform role responsibilities to the fullest extent possible will improve Outcome: Progressing   Problem: Pain Management: Goal: Pain level will decrease with appropriate interventions Outcome: Progressing   Problem: Skin Integrity: Goal: Demonstration of wound healing without infection will improve Outcome: Progressing

## 2023-02-11 NOTE — Progress Notes (Signed)
Patient refusing all labs, dialysis treatment, and MRI. Says she wants to wait until this afternoon. Explained to patient the importance of going to procedures and exams when scheduled due to availability in other departments. Patient still refused stating she is on an important phone call and to come back later. Patient is also wanting to speak to hospice liaison. Notified Dr. Thedore Mins.

## 2023-02-11 NOTE — Progress Notes (Signed)
[facility] Liaison Note  Received request from [place], Transitions of Care Manager, to meet with patient and discuss hospice vs OP Palliative care services.   Met with patient and patient's religious leader (with patient's approval). Discussed hospice vs palliative care and resources. Allowed patient to verbalize her concerns about reluctance to continue HD but not wanting to cause increased stress to her mother and brother.   Patient verbalized understanding of information given.  [facility] information and contact numbers given to patient. Above information shared with, Transitions of Care Manager. Please call with any questions or concerns.   Thank you for the opportunity to participate in this patient's care.   Glenna Fellows BSN, Charity fundraiser, [facility] [employer] [phone]

## 2023-02-11 NOTE — Progress Notes (Signed)
Attempted to bring patient down, patient refused MRI. Confirmed Per RN Freida Busman

## 2023-02-12 DIAGNOSIS — Z7189 Other specified counseling: Secondary | ICD-10-CM | POA: Diagnosis not present

## 2023-02-12 DIAGNOSIS — Z515 Encounter for palliative care: Secondary | ICD-10-CM | POA: Diagnosis not present

## 2023-02-12 DIAGNOSIS — T8781 Dehiscence of amputation stump: Secondary | ICD-10-CM | POA: Diagnosis not present

## 2023-02-12 DIAGNOSIS — Z5189 Encounter for other specified aftercare: Secondary | ICD-10-CM | POA: Diagnosis not present

## 2023-02-12 LAB — CBC WITH DIFFERENTIAL/PLATELET
Abs Immature Granulocytes: 0.19 10*3/uL — ABNORMAL HIGH (ref 0.00–0.07)
Basophils Absolute: 0.1 10*3/uL (ref 0.0–0.1)
Basophils Relative: 1 %
Eosinophils Absolute: 0.7 10*3/uL — ABNORMAL HIGH (ref 0.0–0.5)
Eosinophils Relative: 4 %
HCT: 26.3 % — ABNORMAL LOW (ref 36.0–46.0)
Hemoglobin: 8 g/dL — ABNORMAL LOW (ref 12.0–15.0)
Immature Granulocytes: 1 %
Lymphocytes Relative: 24 %
Lymphs Abs: 3.6 10*3/uL (ref 0.7–4.0)
MCH: 28.9 pg (ref 26.0–34.0)
MCHC: 30.4 g/dL (ref 30.0–36.0)
MCV: 94.9 fL (ref 80.0–100.0)
Monocytes Absolute: 1.1 10*3/uL — ABNORMAL HIGH (ref 0.1–1.0)
Monocytes Relative: 7 %
Neutro Abs: 9.6 10*3/uL — ABNORMAL HIGH (ref 1.7–7.7)
Neutrophils Relative %: 63 %
Platelets: 322 10*3/uL (ref 150–400)
RBC: 2.77 MIL/uL — ABNORMAL LOW (ref 3.87–5.11)
RDW: 15.9 % — ABNORMAL HIGH (ref 11.5–15.5)
WBC: 15.2 10*3/uL — ABNORMAL HIGH (ref 4.0–10.5)
nRBC: 0 % (ref 0.0–0.2)

## 2023-02-12 LAB — GLUCOSE, CAPILLARY
Glucose-Capillary: 159 mg/dL — ABNORMAL HIGH (ref 70–99)
Glucose-Capillary: 174 mg/dL — ABNORMAL HIGH (ref 70–99)
Glucose-Capillary: 179 mg/dL — ABNORMAL HIGH (ref 70–99)

## 2023-02-12 MED ORDER — GERHARDT'S BUTT CREAM
TOPICAL_CREAM | Freq: Three times a day (TID) | CUTANEOUS | Status: DC
  Administered 2023-02-12: 1 via TOPICAL
  Filled 2023-02-12 (×2): qty 60

## 2023-02-12 NOTE — Plan of Care (Signed)
  Problem: Health Behavior/Discharge Planning: Goal: Ability to manage health-related needs will improve Outcome: Progressing   Problem: Clinical Measurements: Goal: Will remain free from infection Outcome: Progressing   Problem: Activity: Goal: Risk for activity intolerance will decrease Outcome: Progressing   Problem: Pain Management: Goal: General experience of comfort will improve Outcome: Progressing   Problem: Safety: Goal: Ability to remain free from injury will improve Outcome: Progressing   Problem: Skin Integrity: Goal: Risk for impaired skin integrity will decrease Outcome: Progressing

## 2023-02-12 NOTE — Progress Notes (Signed)
[facility] Progress Note   Subjective:    Seen and examined patient at bedside. Informed patient refused hemodialysis yesterday. I discussed with her on the importance on going to her treatments. She agreed to go this afternoon.   Objective Vitals:   02/11/23 2105 02/11/23 2129 02/11/23 2333 02/12/23 0329  BP: (!) 124/59 (!) 124/59 (!) 101/50 (!) 129/93  Pulse: 83 83 80   Resp: 18  18 20   Temp: 98.7 F (37.1 C)  98.1 F (36.7 C) 97.6 F (36.4 C)  TempSrc: Oral  Oral Oral  SpO2: 100%  100% 98%  Weight:      Height:       Physical Exam General:chronically ill appearing female in NAD Heart:RRR, no mrg Lungs:CTAB, nml WOB on RA Abdomen:soft, NTND Extremities:R stump dressed, trace LLE edema Dialysis Access: [facility]   Filed Weights   02/02/23 0853 02/05/23 1520 02/05/23 1759  Weight: 113.4 kg 124.2 kg 125.1 kg   No intake or output data in the 24 hours ending 02/12/23 1018  Additional Objective Labs: Basic Metabolic Panel: Recent Labs  Lab 02/06/23 1757 02/10/23 0225  NA 135 142  K 3.4* 3.0*  CL 101 109  CO2 23 20*  GLUCOSE 160* 203*  BUN 25* 37*  CREATININE 4.81* 4.88*  CALCIUM 8.4* 7.8*  PHOS  --  5.5*   Liver Function Tests: Recent Labs  Lab 02/06/23 1757 02/10/23 0225  AST 12*  --   ALT <5  --   ALKPHOS 93  --   BILITOT 0.6  --   PROT 6.3*  --   ALBUMIN 2.1* 1.7*   No results for input(s): "LIPASE", "AMYLASE" in the last 168 hours. CBC: Recent Labs  Lab 02/08/23 0425 02/09/23 0422 02/10/23 0225 02/11/23 1811 02/12/23 0429  WBC 17.1* 15.1* 14.8* 15.7* 15.2*  NEUTROABS 11.5* 10.2* 10.6* 10.6* 9.6*  HGB 7.8* 7.8* 7.7* 8.4* 8.0*  HCT 25.5* 24.9* 25.9* 27.4* 26.3*  MCV 94.1 93.3 94.5 94.2 94.9  PLT 324 332 353 339 322   Blood Culture    Component Value Date/Time   SDES BLOOD RIGHT HAND 01/30/2023 0436   SPECREQUEST  01/30/2023 0436    BOTTLES DRAWN AEROBIC ONLY Blood Culture results may not be optimal due to an inadequate  volume of blood received in culture bottles   CULT  01/30/2023 0436    NO GROWTH 5 DAYS Performed at [facility], [address] [place], [place] [address]    REPTSTATUS 02/04/2023 FINAL 01/30/2023 0436    Cardiac Enzymes: No results for input(s): "CKTOTAL", "CKMB", "CKMBINDEX", "TROPONINI" in the last 168 hours. CBG: Recent Labs  Lab 02/10/23 1225 02/10/23 1816 02/10/23 2149 02/11/23 1212 02/11/23 2105  GLUCAP 186* 197* 163* 171* 216*   Iron Studies: No results for input(s): "IRON", "TIBC", "TRANSFERRIN", "FERRITIN" in the last 72 hours. Lab Results  Component Value Date   INR 1.9 (H) 12/15/2022   INR 1.8 (H) 11/23/2022   INR 1.1 10/08/2020   Studies/Results: MR ANKLE LEFT WO CONTRAST  Result Date: 02/11/2023 CLINICAL DATA:  Chronic ankle pain, heel wound EXAM: MRI OF THE LEFT ANKLE WITHOUT CONTRAST TECHNIQUE: Multiplanar, multisequence MR imaging of the ankle was performed. No intravenous contrast was administered. COMPARISON:  None Available. FINDINGS: Despite efforts by the technologist and patient, motion artifact is present on today's exam and could not be eliminated. This reduces exam sensitivity and specificity. TENDONS Peroneal: Common peroneus tendon sheath tenosynovitis. Posteromedial: Distal tibialis posterior tendinopathy. Suspected small accessory  navicular although less appreciable than I would expect on the T1 weighted sequence. Mild flexor hallucis longus tenosynovitis just proximal to the knot of Henry. Anterior: Unremarkable Achilles: Unremarkable Plantar Fascia: Abnormal thickening of the medial band compatible with plantar fasciitis. LIGAMENTS Lateral: Grossly intact, obscured by motion artifact. Medial: Obscured by motion artifact, integrity of the deltoid ligament is difficult to assess. CARTILAGE Ankle Joint: Grossly unremarkable. Subtalar Joints/Sinus Tarsi: Moderate degenerative spurring. Bones: Substantial degenerative spurring at the Chopart joint and  in the midfoot. No findings of osteomyelitis. Other: Subcutaneous edema anterolaterally along the ankle and tracking into the dorsum of the foot. IMPRESSION: 1. No findings of osteomyelitis. 2. Common peroneus tendon sheath tenosynovitis. 3. Distal tibialis posterior tendinopathy. 4. Mild flexor hallucis longus tenosynovitis. 5. Plantar fasciitis. 6. Subcutaneous edema anterolaterally along the ankle and tracking into the dorsum of the foot. 7. Degenerative spurring at the Chopart joint and in the midfoot. Electronically Signed   By: Gaylyn Rong M.D.   On: 02/11/2023 13:56   MR FOOT LEFT WO CONTRAST  Result Date: 02/10/2023 CLINICAL DATA:  Chronic foot pain EXAM: MRI OF THE LEFT FOOT WITHOUT CONTRAST TECHNIQUE: Multiplanar, multisequence MR imaging of the left forefoot was performed. No intravenous contrast was administered. COMPARISON:  02/07/2023 FINDINGS: Despite efforts by the technologist and patient, motion artifact is present on today's exam and could not be eliminated. This reduces exam sensitivity and specificity. Bones/Joint/Cartilage No marrow edema in the forefoot to suggest osteomyelitis. Spurring of the first metatarsal head noted with a 4 mm degenerative subcortical cystic lesion along the central articular surface of the first metatarsal head on image 9 series 6. First digit sesamoids unremarkable. No definite erosion observed. Mild degenerative findings including loss of articular space and spurring at the Lisfranc joint. Ligaments The Lisfranc ligament appears intact. No definite turf toe or plantar plate injury identified. Muscles and Tendons Moderate regional muscular atrophy. Soft tissues Subcutaneous edema along the dorsum of the foot and extending into the toes. There is lateral subcutaneous edema at the level of the fifth MTP joint as well as subcutaneous edema along the ball of the foot also extending into the toes. IMPRESSION: 1. No marrow edema in the forefoot to suggest  osteomyelitis. 2. Subcutaneous edema along the dorsum of the foot and extending into the toes, and also along the ball of the foot. 3. Moderate regional muscular atrophy. 4. Mild degenerative findings at the Lisfranc joint and first metatarsophalangeal joint. Electronically Signed   By: Gaylyn Rong M.D.   On: 02/10/2023 18:59    Medications:  cefTAZidime (FORTAZ)  IV Stopped (02/12/23 0214)   vancomycin Stopped (02/10/23 0700)   vancomycin      busPIRone  5 mg Oral TID   clonazepam  0.5 mg Oral BID   darbepoetin (ARANESP) injection - DIALYSIS  100 mcg Subcutaneous Q Sat-1800   feeding supplement (NEPRO CARB STEADY)  237 mL Oral Q24H   ferric citrate  210 mg Oral TID WC   Gerhardt's butt cream   Topical TID   heparin  5,000 Units Subcutaneous Q8H   insulin aspart  0-9 Units Subcutaneous TID WC   metoprolol tartrate  50 mg Oral BID   metroNIDAZOLE  500 mg Oral Q12H   midodrine  10 mg Oral TID WC   mirtazapine  15 mg Oral QHS   multivitamin  1 tablet Oral QHS   nutrition supplement (JUVEN)  1 packet Oral BID BM   oxyCODONE  10 mg Oral Q12H  pantoprazole  40 mg Oral BID   zinc sulfate (50mg  elemental zinc)  220 mg Oral Daily    Dialysis Orders: MWF [facility]  Going to start back at [facility] on d/c TTS schedule as of now  4h  400/800  121kg   3K/2.5Ca bath   TDC   Heparin none - last OP HD 11/18, post wt 124.6kg  - has been usually reaching dry wt - missed 2 sessions in last 3 wks 11/20 and 11/13 - was in hospital 10/9- 11/04 here  - dry wt lowered 6 kg 2 wks ago - venofer 100mg  q hd thru 12/04 - mircera 100 mcg q 2 wks, last 10/5, due 10/19  Assessment/Plan: Sepsis - presenting w/ SIRS, due to dehisced [name]. IV abx started and ortho consulted. S/p bka revision 11/27. ID consulted. Recommend ABX for 2weeks - Vanc & Fortaz with HD. Per primary  [name] - MRI of L foot cancelled. Likely w/osteo. Dr. Lajoyce Corners following. ID recommending Doxy 100mg  BID and Augmentin 500/125mg  every  day. ESKD - on HD MWF.  Plan to resume 2nd shift schedule at [facility] TTS on d/c.  Per Renal Navigator's recent note, MWF schedule is not available. Reports previous non compliance due to timing in the AM.  Discussed timing hard to dictate in hospital due to patient load/acuity. As outpatient only certain time slots are available, a request can be placed with outpatient staff for later time if one becomes available but latest on time are typically around 1pm.  Continue to counsel on importance of compliance.    HTN - BP in goal. On metoprolol 50mg  BID and midodrine 10mg  pre HD.  Cont meds as needed.  Volume - no gross vol excess on exam.  UF as tolerated.  Anemia of eskd - hgb 8.0. On aranesp qwk, last 11/30. MBD ckd - CCa in range. Phos at goal. Continue binders.  DM2- per primary Nutrition - Carb modified diet w/fluid restrictions. Give protein supplements. Renal Vit GOC - appreciate palliative care assistance. Wants to remain full code and continue dialysis BUT she has been refusing treatment lately, noted refusal on 12/2 and 12/6. Will need to re-visit goals of care if she continues to refuse treatment.  Salome Holmes, NP [facility] 02/12/2023,10:18 AM  LOS: 14 days

## 2023-02-12 NOTE — Progress Notes (Signed)
PROGRESS NOTE  Deborah [name] GUY:[id number] DOB: [birth year] DOA: 01/29/2023 PCP: Pcp, No   LOS: 14 days   Brief Narrative / Interim history: Patient is a 57 y.o.  female with history of ESRD on HD MWF, PAF-not on any anticoagulation due to recent GI bleeding, HFpEF, DM-2, HTN, chronic pain syndrome on narcotics-who underwent BKA 10/23-presented with a right BKA stump dehiscence.   Subjective - Patient in bed, appears comfortable, denies any headache, no fever, no chest pain or pressure, no shortness of breath , no abdominal pain. No new focal weakness.   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 Lajoyce Corners 11/27.  Continue wound VAC.  Left heel ulcer -with likely osteomyelitis and some surrounding acute cellulitis, present on admission, Dr. Lajoyce Corners following, on IV antibiotics, MRI pending, patient refused on 02/07/2023 and 02/08/2023, counseled again may get it done on 02/09/2023.  Follow cultures.  Seen by ID per ID following antibiotic regimen.  Vanc + Ceftaz + oral Metronidazole x 2 weeks (EOT 12/17)  Then would start Doxy 100 mg bid + Augmentin 500/125 mg at bedtime in an attempt to suppress until surgical management can be offered.    MRI of the foot and ankle does not show any osteomyelitis, does have some chronic foot changes consistent with tenosynovitis and plantar fasciitis.  Of note patient refused the studies multiple times, will discuss the case with Dr. Lajoyce Corners.   Some narcotic seeking behavior with history of chronic pain.  Definitely exhibiting some narcotic seeking behavior, counseled multiple times against overuse, cautiously use narcotics for acute pain with some Narcan on board.    ESRD on HD-nephrology consulted, noncompliant, refusing to go to dialysis on 02/07/2023  again on 02/11/2023 - counseled.  Hypokalemia-replace as indicated, continue to monitor  Generalized body tremors /jerking movement-on 11/25, no recurrence since then.  Neurology briefly evaluated, appears to be functional  History of GI bleed October 2024-thought to be due to AVMs based on the EGD.  No longer an issue, hemoglobin overall stable  History of [place]-supportive care, she would benefit from weight loss  PAF-not on anticoagulation due to recent GI bleed.  Continue metoprolol  Hypokalemia.  Dialysis patient, low-dose potassium supplementation.    Chronic diastolic CHF, EF 56% on recent echo-volume management per HD, compensated  Obesity, morbid-BMI 40.3.  She would benefit from goals  Depression/anxiety/panic attacks- continue  BuSpar  History of tardive dyskinesia- supportive care  History of malignant duodenal carcinoid-status postresection in the past  GOC -patient did mention to me this morning that she is thinking about stopping dialysis as her quality of life is quite poor.  She mentioned the same to Dr. Thedore Mins with nephrology and she is thinking about hospice, but for now wants to continue full medical treatment  She now claims that she has become homeless since this admission and wants help with placement.     DM2 -controlled  Lab Results  Component Value Date   HGBA1C 5.7 (H) 02/04/2023   CBG (last 3)  Recent Labs    02/10/23 2149 02/11/23 1212 02/11/23 2105  GLUCAP 163* 171* 216*   Right IJ hemodialysis catheter present.  Scheduled Meds:  busPIRone  5 mg Oral TID   clonazepam  0.5 mg Oral BID   darbepoetin (ARANESP) injection - DIALYSIS  100 mcg Subcutaneous Q  Sat-1800   feeding supplement (NEPRO CARB STEADY)  237 mL Oral Q24H   ferric citrate  210 mg Oral TID WC   Gerhardt's butt cream   Topical TID   heparin  5,000 Units Subcutaneous Q8H   insulin aspart  0-9 Units Subcutaneous TID WC   metoprolol tartrate  50 mg Oral BID   metroNIDAZOLE  500  mg Oral Q12H   midodrine  10 mg Oral TID WC   mirtazapine  15 mg Oral QHS   multivitamin  1 tablet Oral QHS   nutrition supplement (JUVEN)  1 packet Oral BID BM   oxyCODONE  10 mg Oral Q12H   pantoprazole  40 mg Oral BID   zinc sulfate (50mg  elemental zinc)  220 mg Oral Daily   Continuous Infusions:  cefTAZidime (FORTAZ)  IV Stopped (02/12/23 0214)   vancomycin Stopped (02/10/23 0700)   vancomycin     PRN Meds:.acetaminophen, bisacodyl, guaiFENesin-dextromethorphan, hydrALAZINE, HYDROmorphone (DILAUDID) injection, labetalol, loperamide, LORazepam, melatonin, methocarbamol, metoprolol tartrate, naLOXone (NARCAN)  injection, [DISCONTINUED] ondansetron **OR** ondansetron (ZOFRAN) IV, mouth rinse, phenol, polyethylene glycol, senna-docusate   Diet Orders (From admission, onward)     Start     Ordered   02/07/23 1008  Diet Carb Modified Fluid consistency: Thin; Room service appropriate? Yes; Fluid restriction: 1500 mL Fluid  Diet effective now       Question Answer Comment  Diet-HS Snack? Nothing   Calorie Level Medium 1600-2000   Fluid consistency: Thin   Room service appropriate? Yes   Fluid restriction: 1500 mL Fluid      02/07/23 1007            DVT prophylaxis: SCD's Start: 02/02/23 1640 heparin injection 5,000 Units Start: 01/29/23 2200   Lab Results  Component Value Date   PLT 322 02/12/2023      Code Status: Full Code  Family Communication: Significant other at bedside  Status is: Inpatient Remains inpatient appropriate because: Severity of illness  Level of care: Progressive  Consultants:  Nephrology Orthopedic surgery  Objective: Vitals:   02/11/23 2105 02/11/23 2129 02/11/23 2333 02/12/23 0329  BP: (!) 124/59 (!) 124/59 (!) 101/50 (!) 129/93  Pulse: 83 83 80   Resp: 18  18 20   Temp: 98.7 F (37.1 C)  98.1 F (36.7 C) 97.6 F (36.4 C)  TempSrc: Oral  Oral Oral  SpO2: 100%  100% 98%  Weight:      Height:       No intake or output data in  the 24 hours ending 02/12/23 0825   Wt Readings from Last 3 Encounters:  02/05/23 125.1 kg  01/08/23 121.2 kg  11/28/22 127.5 kg    Examination:  Patient in bed sleeping comfortably woken up, no focal deficits [place].AT,PERRAL Supple Neck, No JVD,   Symmetrical Chest wall movement, Good air movement bilaterally, CTAB RRR,No Gallops, Rubs or new Murmurs,  +ve B.Sounds, Abd Soft, No tenderness,   Right BKA stump under bandage with wound VAC, left ankle under bandage, right IJ hemodialysis catheter   Data Reviewed: I have independently reviewed following labs and imaging studies   Recent Labs  Lab 02/08/23 0425 02/09/23 0422 02/10/23 0225 02/11/23 1811 02/12/23 0429  WBC 17.1* 15.1* 14.8* 15.7* 15.2*  HGB 7.8* 7.8* 7.7* 8.4* 8.0*  HCT 25.5* 24.9* 25.9* 27.4* 26.3*  PLT 324 332 353 339 322  MCV 94.1 93.3 94.5 94.2 94.9  MCH 28.8 29.2 28.1 28.9 28.9  MCHC 30.6 31.3 29.7* 30.7 30.4  RDW 15.2  15.2 15.7* 15.9* 15.9*  LYMPHSABS 3.6 3.0 2.3 3.2 3.6  MONOABS 1.3* 1.2* 1.0 0.9 1.1*  EOSABS 0.5 0.5 0.5 0.7* 0.7*  BASOSABS 0.1 0.1 0.1 0.1 0.1    Recent Labs  Lab 02/06/23 1757 02/07/23 0423 02/08/23 0425 02/09/23 0422 02/10/23 0225 02/10/23 0747 02/11/23 1811  NA 135  --   --   --  142  --   --   K 3.4*  --   --   --  3.0*  --   --   CL 101  --   --   --  109  --   --   CO2 23  --   --   --  20*  --   --   ANIONGAP 11  --   --   --  13  --   --   GLUCOSE 160*  --   --   --  203*  --   --   BUN 25*  --   --   --  37*  --   --   CREATININE 4.81*  --   --   --  4.88*  --   --   AST 12*  --   --   --   --   --   --   ALT <5  --   --   --   --   --   --   ALKPHOS 93  --   --   --   --   --   --   BILITOT 0.6  --   --   --   --   --   --   ALBUMIN 2.1*  --   --   --  1.7*  --   --   CRP  --  7.1* 8.0* 8.2*  --  8.0* 5.9*  PROCALCITON  --  0.53 0.63  --   --   --   --   MG 1.5*  --   --   --   --   --   --   CALCIUM 8.4*  --   --   --  7.8*  --   --     Lab Results   Component Value Date   CHOL 104 07/22/2019   HDL 30 (L) 07/22/2019   LDLCALC 37 07/22/2019   TRIG 185 (H) 07/22/2019   CHOLHDL 3.5 07/22/2019      Recent Labs  Lab 02/06/23 1757 02/07/23 0423 02/08/23 0425 02/09/23 0422 02/10/23 0225 02/10/23 0747 02/11/23 1811  CRP  --  7.1* 8.0* 8.2*  --  8.0* 5.9*  PROCALCITON  --  0.53 0.63  --   --   --   --   MG 1.5*  --   --   --   --   --   --   CALCIUM 8.4*  --   --   --  7.8*  --   --       Recent Results (from the past 240 hour(s))  MRSA Next Gen by PCR, Nasal     Status: None   Collection Time: 02/06/23  6:05 AM   Specimen: Nasal Mucosa; Nasal Swab  Result Value Ref Range Status   MRSA by PCR Next Gen NOT DETECTED NOT DETECTED Final    Comment: (NOTE) The GeneXpert MRSA Assay (FDA approved for NASAL specimens only), is one component of a comprehensive MRSA colonization surveillance program. It is not intended to  diagnose MRSA infection nor to guide or monitor treatment for MRSA infections. Test performance is not FDA approved in patients less than 46 years old. Performed at [facility], [address] [place], [place] [address]      Radiology Studies: MR ANKLE LEFT WO CONTRAST  Result Date: 02/11/2023 CLINICAL DATA:  Chronic ankle pain, heel wound EXAM: MRI OF THE LEFT ANKLE WITHOUT CONTRAST TECHNIQUE: Multiplanar, multisequence MR imaging of the ankle was performed. No intravenous contrast was administered. COMPARISON:  None Available. FINDINGS: Despite efforts by the technologist and patient, motion artifact is present on today's exam and could not be eliminated. This reduces exam sensitivity and specificity. TENDONS Peroneal: Common peroneus tendon sheath tenosynovitis. Posteromedial: Distal tibialis posterior tendinopathy. Suspected small accessory navicular although less appreciable than I would expect on the T1 weighted sequence. Mild flexor hallucis longus tenosynovitis just proximal to the knot of [place].  Anterior: Unremarkable Achilles: Unremarkable Plantar Fascia: Abnormal thickening of the medial band compatible with plantar fasciitis. LIGAMENTS Lateral: Grossly intact, obscured by motion artifact. Medial: Obscured by motion artifact, integrity of the deltoid ligament is difficult to assess. CARTILAGE Ankle Joint: Grossly unremarkable. Subtalar Joints/Sinus Tarsi: Moderate degenerative spurring. Bones: Substantial degenerative spurring at the Chopart joint and in the midfoot. No findings of osteomyelitis. Other: Subcutaneous edema anterolaterally along the ankle and tracking into the dorsum of the foot. IMPRESSION: 1. No findings of osteomyelitis. 2. Common peroneus tendon sheath tenosynovitis. 3. Distal tibialis posterior tendinopathy. 4. Mild flexor hallucis longus tenosynovitis. 5. Plantar fasciitis. 6. Subcutaneous edema anterolaterally along the ankle and tracking into the dorsum of the foot. 7. Degenerative spurring at the Chopart joint and in the midfoot. Electronically Signed   By: Gaylyn Rong M.D.   On: 02/11/2023 13:56     Signature  -    Susa Raring M.D on 02/12/2023 at 8:25 AM   -  To page go to [email or web address]

## 2023-02-12 NOTE — Progress Notes (Signed)
Nurse informed me that patient refused dialysis this morning.  Wants 2nd or 3rd shift.  Stacie Glaze [id number]

## 2023-02-13 DIAGNOSIS — Z5189 Encounter for other specified aftercare: Secondary | ICD-10-CM | POA: Diagnosis not present

## 2023-02-13 DIAGNOSIS — T8781 Dehiscence of amputation stump: Secondary | ICD-10-CM | POA: Diagnosis not present

## 2023-02-13 DIAGNOSIS — Z7189 Other specified counseling: Secondary | ICD-10-CM | POA: Diagnosis not present

## 2023-02-13 DIAGNOSIS — Z515 Encounter for palliative care: Secondary | ICD-10-CM | POA: Diagnosis not present

## 2023-02-13 LAB — GLUCOSE, CAPILLARY
Glucose-Capillary: 99 mg/dL (ref 70–99)
Glucose-Capillary: 120 mg/dL — ABNORMAL HIGH (ref 70–99)
Glucose-Capillary: 109 mg/dL — ABNORMAL HIGH (ref 70–99)

## 2023-02-13 NOTE — Progress Notes (Signed)
Daily Progress Note   Patient Name: Deborah [name]       Date: 02/13/2023 DOB: 1965/05/26  Age: 57 y.o. MRN#: [id number] Attending Physician: Leroy Sea, MD Primary Care Physician: Pcp, No Admit Date: 01/29/2023  Reason for Consultation/Follow-up: Establishing goals of care  Length of Stay: 15  Current Medications: Scheduled Meds:   busPIRone  5 mg Oral TID   clonazepam  0.5 mg Oral BID   darbepoetin (ARANESP) injection - DIALYSIS  100 mcg Subcutaneous Q Sat-1800   feeding supplement (NEPRO CARB STEADY)  237 mL Oral Q24H   ferric citrate  210 mg Oral TID WC   Gerhardt's butt cream   Topical TID   heparin  5,000 Units Subcutaneous Q8H   insulin aspart  0-9 Units Subcutaneous TID WC   metoprolol tartrate  50 mg Oral BID   metroNIDAZOLE  500 mg Oral Q12H   midodrine  10 mg Oral TID WC   mirtazapine  15 mg Oral QHS   multivitamin  1 tablet Oral QHS   nutrition supplement (JUVEN)  1 packet Oral BID BM   oxyCODONE  10 mg Oral Q12H   pantoprazole  40 mg Oral BID   zinc sulfate (50mg  elemental zinc)  220 mg Oral Daily    Continuous Infusions:  cefTAZidime (FORTAZ)  IV Stopped (02/12/23 2221)   vancomycin Stopped (02/10/23 0700)    PRN Meds: acetaminophen, bisacodyl, guaiFENesin-dextromethorphan, hydrALAZINE, HYDROmorphone (DILAUDID) injection, labetalol, loperamide, LORazepam, melatonin, methocarbamol, metoprolol tartrate, naLOXone (NARCAN)  injection, [DISCONTINUED] ondansetron **OR** ondansetron (ZOFRAN) IV, mouth rinse, phenol, polyethylene glycol, senna-docusate  Physical Exam Constitutional:      General: She is awake.  HENT:     Head: Normocephalic and atraumatic.     Mouth/Throat:     Mouth: Mucous membranes are dry.  Cardiovascular:     Rate and Rhythm:  Normal rate.  Pulmonary:     Effort: Pulmonary effort is normal.  Skin:    General: Skin is warm and dry.  Neurological:     Mental Status: She is oriented to person, place, and time.            Vital Signs: BP (!) 114/47 (BP Location: Right Wrist)   Pulse 85   Temp 99.3 F (37.4 C) (Axillary)   Resp 17   Ht 5\' 6"  (  1.676 m)   Wt 125.1 kg   LMP 10/10/2012   SpO2 100%   BMI 44.51 kg/m  SpO2: SpO2: 100 % O2 Device: O2 Device: Room Air O2 Flow Rate: O2 Flow Rate (L/min): 6 L/min    Patient Active Problem List   Diagnosis Date Noted   Right below-knee amputee (HCC) 01/31/2023   Dehiscence of amputation stump of right lower extremity (HCC) 01/29/2023   Tardive dyskinesia 01/06/2023   Peutz-Jeghers polyps of small bowel (HCC) 12/24/2022   Gastric AVM 12/23/2022   Acute osteomyelitis of right calcaneus (HCC) 12/21/2022   Acute on chronic anemia 12/20/2022   Symptomatic anemia 12/14/2022   Pressure injury of skin 11/30/2022   Non-pressure chronic ulcer of right heel and midfoot limited to breakdown of skin (HCC) 11/24/2022   Sepsis (HCC) 11/23/2022   Elevated troponin 09/09/2022   Nausea & vomiting 09/09/2022   End stage renal disease on dialysis (HCC) 09/09/2022   Chronic a-fib (HCC) 09/09/2022   Atrial fibrillation with RVR (HCC)-resolved 09/09/2022   Peripheral neuropathy 09/09/2022   (HFpEF) heart failure with preserved ejection fraction (HCC) 09/09/2022   Hypotension 09/09/2022   CHF (congestive heart failure) (HCC) 07/13/2022   HCAP (healthcare-associated pneumonia) 06/19/2022   Acute encephalopathy 05/29/2022   Hyponatremia 05/29/2022   Chronic anemia 05/22/2022   Hyperosmolar hyperglycemic state (HHS) (HCC) 05/11/2022   Hyperosmolar non-ketotic state due to type 2 diabetes mellitus (HCC) 05/11/2022   Leukocytosis 05/11/2022   Edema of left upper extremity 03/21/2022   Tachycardia 03/18/2022   Atypical chest pain 09/10/2021   Acute pyelonephritis    PAF  (paroxysmal atrial fibrillation) (HCC)    Chronic pain 09/04/2019   Malignant carcinoid tumor of duodenum (HCC)    NASH (nonalcoholic steatohepatitis) 06/05/2019   Chronic diarrhea    Restrictive lung disease secondary to obesity    History of gastric ulcer    Fibroid uterus 02/23/2019   Abdominal pain 07/17/2018   Anxiety 11/29/2017   Spinal stenosis, lumbar region with neurogenic claudication 08/03/2017   GERD (gastroesophageal reflux disease) 03/19/2017   Depression 03/19/2017   Morbid obesity (HCC)    Normocytic anemia 08/16/2016   Diabetic gastroparesis (HCC) 08/16/2016   Gout 06/05/2016   Chronic hypokalemia 09/26/2015   Hypomagnesemia and hypokalemia 09/26/2015   Insulin dependent type 2 diabetes mellitus (HCC) 05/25/2015   Essential hypertension 09/28/2013    Palliative Care Assessment & Plan   Patient Profile: 57 y.o. female with past medical history of ESRD on HD MWF, PAF-not on any anticoagulation due to recent GI bleeding, HFpEF, DM-2, HTN, chronic pain syndrome on narcotics-who underwent BKA 10/23-presented with a right BKA stump dehiscence.   Today's Discussion: Patient laying in bed reports abdominal discomfort but is open to talking to me today. She reports she has declined hemodialysis due to being uncomfortable when they come to get her. She feels that at these times she would not be able to sit through dialysis so she declines.  I asked if it was okay for [place] to discuss her goals of care and she agrees. We discussed the importance of making her wishes known/ documented. We discussed code status. Recommended consideration of DNR status, understanding evidenced-based poor outcomes in similar hospitalized patients, as the cause of the arrest is likely associated with chronic/terminal disease rather than a reversible acute cardio-pulmonary event. She would like to remain a full code at this time. We discussed scopes of care and she would like to remain full scope at this  time. She shares  that she will further consider both topics and is open to continued conversation around the subjects.  We discuss dialysis at length. We discuss what her end of life would likely look like if she were to discontinue dialysis. We discussed what comfort measures would look like and hospice. She met with the hospice liaison Friday. She is appreciative of the information he provided. She shares that she wants to continue dialysis at this time because she "wants to live."   Encouraged patient to call PMT with questions or concerns.  Recommendations/Plan: Full code Full scope Continue HD Encouraged continued conversation re: goals of care and scope of care Continued PMT support    Code Status:    Code Status Orders  (From admission, onward)           Start     Ordered   01/29/23 1601  Full code  Continuous       Question:  By:  Answer:  Consent: discussion documented in EHR   01/29/23 1603           Extensive chart review has been completed prior to seeing the patient including labs, vital signs, imaging, progress/consult notes, orders, medications, and available advance directive documents.  Care plan was discussed with Salome Holmes NP  Time spent: 35 minutes  Thank you for allowing the Palliative Medicine Team to assist in the care of this patient.     Sherryll Burger, NP  Please contact Palliative Medicine Team phone at [phone] for questions and concerns.

## 2023-02-13 NOTE — Progress Notes (Signed)
PROGRESS NOTE  Deborah [name] ZOX:[id number] DOB: Jan 20, 1966 DOA: 01/29/2023 PCP: Pcp, No   LOS: 15 days   Brief Narrative / Interim history:  Patient is a 58 y.o.  female with history of ESRD on HD MWF, PAF-not on any anticoagulation due to recent GI bleeding, HFpEF, DM-2, HTN, chronic pain syndrome on narcotics, narcotic seeking behavior, noncompliant with tests and treatment,-who underwent BKA 10/23-presented with a right BKA stump dehiscence, left heel cellulitis, seen by Dr. Lajoyce Corners and ID.  MRI thus far does not show any osteomyelitis of the left foot or ankle.  Subjective -patient in bed, appears comfortable, denies any headache, no fever, no chest pain or pressure, no shortness of breath , no abdominal pain. No focal weakness.   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 Lajoyce Corners 11/27.  Continue wound VAC.  Left heel ulcer -with likely osteomyelitis and some surrounding acute cellulitis, present on admission, Dr. Lajoyce Corners following, on IV antibiotics, MRI of left ankle and foot no osteomyelitis, discussed with Dr. Lajoyce Corners, Dr. Lajoyce Corners continue supportive care and antibiotics per ID, note patient refused MRI testing for 3 different days and it was finally done after much deliberation and counseling. ID per ID following antibiotic regimen.  Vanc + Ceftaz + oral Metronidazole x 2 weeks (EOT 12/17)  Then would start Doxy 100 mg bid + Augmentin 500/125 mg at bedtime in an attempt to suppress until surgical management can be offered.    MRI of the foot and ankle does not show any osteomyelitis, does have some chronic foot changes consistent with tenosynovitis and plantar fasciitis.  Of note patient refused the studies multiple times, will discuss the case with Dr. Lajoyce Corners.   Some narcotic seeking  behavior with history of chronic pain.  Definitely exhibiting some narcotic seeking behavior, counseled multiple times against overuse, cautiously use narcotics for acute pain with some Narcan on board.    ESRD on HD-nephrology consulted, noncompliant, refusing to go to dialysis on 02/07/2023 again on 02/11/2023 - counseled.  Patient is repeatedly refusing tests and HD treatment despite counseling.  Remains highly manipulative and narcotic seeking.  Hypokalemia-replace as indicated, continue to monitor  Generalized body tremors /jerking movement-on 11/25, no recurrence since then.  Neurology briefly evaluated, appears to be functional  History of GI bleed October 2024-thought to be due to AVMs based on the EGD.  No longer an issue, hemoglobin overall stable  History of [place]-supportive care, she would benefit from weight loss  PAF-not on anticoagulation due to recent GI bleed.  Continue metoprolol  Hypokalemia.  Dialysis patient, low-dose potassium supplementation.    Chronic diastolic CHF, EF 81% on recent echo-volume management per HD, compensated  Obesity, morbid-BMI 40.3.  She would benefit from goals  Depression/anxiety/panic attacks- continue  BuSpar  History of tardive dyskinesia- supportive care  History of malignant duodenal carcinoid-status postresection in the past  GOC -patient did mention to me this morning that she is thinking about stopping dialysis as her quality of life is quite poor.  She mentioned the same to Dr. Thedore Mins with nephrology and she is thinking about hospice, but for now wants to continue full medical treatment  She now claims that she has become homeless since this admission and wants help with placement.     DM2 -controlled  Lab Results  Component Value  Date   HGBA1C 5.7 (H) 02/04/2023   CBG (last 3)  Recent Labs    02/12/23 1137 02/12/23 1635 02/12/23 2123  GLUCAP 159* 174* 179*   Right IJ hemodialysis catheter present.  Scheduled Meds:   busPIRone  5 mg Oral TID   clonazepam  0.5 mg Oral BID   darbepoetin (ARANESP) injection - DIALYSIS  100 mcg Subcutaneous Q Sat-1800   feeding supplement (NEPRO CARB STEADY)  237 mL Oral Q24H   ferric citrate  210 mg Oral TID WC   Gerhardt's butt cream   Topical TID   heparin  5,000 Units Subcutaneous Q8H   insulin aspart  0-9 Units Subcutaneous TID WC   metoprolol tartrate  50 mg Oral BID   metroNIDAZOLE  500 mg Oral Q12H   midodrine  10 mg Oral TID WC   mirtazapine  15 mg Oral QHS   multivitamin  1 tablet Oral QHS   nutrition supplement (JUVEN)  1 packet Oral BID BM   oxyCODONE  10 mg Oral Q12H   pantoprazole  40 mg Oral BID   zinc sulfate (50mg  elemental zinc)  220 mg Oral Daily   Continuous Infusions:  cefTAZidime (FORTAZ)  IV Stopped (02/12/23 2221)   vancomycin Stopped (02/10/23 0700)   vancomycin Stopped (02/13/23 0500)   PRN Meds:.acetaminophen, bisacodyl, guaiFENesin-dextromethorphan, hydrALAZINE, HYDROmorphone (DILAUDID) injection, labetalol, loperamide, LORazepam, melatonin, methocarbamol, metoprolol tartrate, naLOXone (NARCAN)  injection, [DISCONTINUED] ondansetron **OR** ondansetron (ZOFRAN) IV, mouth rinse, phenol, polyethylene glycol, senna-docusate   Diet Orders (From admission, onward)     Start     Ordered   02/07/23 1008  Diet Carb Modified Fluid consistency: Thin; Room service appropriate? Yes; Fluid restriction: 1500 mL Fluid  Diet effective now       Question Answer Comment  Diet-HS Snack? Nothing   Calorie Level Medium 1600-2000   Fluid consistency: Thin   Room service appropriate? Yes   Fluid restriction: 1500 mL Fluid      02/07/23 1007            DVT prophylaxis: SCD's Start: 02/02/23 1640 heparin injection 5,000 Units Start: 01/29/23 2200   Lab Results  Component Value Date   PLT 322 02/12/2023      Code Status: Full Code  Family Communication: Significant other at bedside  Status is: Inpatient Remains inpatient appropriate  because: Severity of illness  Level of care: Progressive  Consultants:  Nephrology Orthopedic surgery  Objective: Vitals:   02/12/23 1944 02/12/23 2008 02/13/23 0030 02/13/23 0606  BP: (!) 111/39 (!) 107/40 (!) 118/41 (!) 112/46  Pulse: 79 80 84 88  Resp: 18 18 18 18   Temp: 97.9 F (36.6 C) 97.8 F (36.6 C) 97.9 F (36.6 C) 99.3 F (37.4 C)  TempSrc: Oral Oral Oral Axillary  SpO2: 99% 98% 100% 100%  Weight:      Height:        Intake/Output Summary (Last 24 hours) at 02/13/2023 0801 Last data filed at 02/13/2023 0601 Gross per 24 hour  Intake 150 ml  Output --  Net 150 ml     Wt Readings from Last 3 Encounters:  02/05/23 125.1 kg  01/08/23 121.2 kg  11/28/22 127.5 kg    Examination:  Patient in bed sleeping comfortably woken up, no focal deficits Gray.AT,PERRAL Supple Neck, No JVD,   Symmetrical Chest wall movement, Good air movement bilaterally, CTAB RRR,No Gallops, Rubs or new Murmurs,  +ve B.Sounds, Abd Soft, No tenderness,   Right BKA stump under bandage with wound  VAC, left ankle under bandage, right IJ hemodialysis catheter   Data Reviewed: I have independently reviewed following labs and imaging studies   Recent Labs  Lab 02/08/23 0425 02/09/23 0422 02/10/23 0225 02/11/23 1811 02/12/23 0429  WBC 17.1* 15.1* 14.8* 15.7* 15.2*  HGB 7.8* 7.8* 7.7* 8.4* 8.0*  HCT 25.5* 24.9* 25.9* 27.4* 26.3*  PLT 324 332 353 339 322  MCV 94.1 93.3 94.5 94.2 94.9  MCH 28.8 29.2 28.1 28.9 28.9  MCHC 30.6 31.3 29.7* 30.7 30.4  RDW 15.2 15.2 15.7* 15.9* 15.9*  LYMPHSABS 3.6 3.0 2.3 3.2 3.6  MONOABS 1.3* 1.2* 1.0 0.9 1.1*  EOSABS 0.5 0.5 0.5 0.7* 0.7*  BASOSABS 0.1 0.1 0.1 0.1 0.1    Recent Labs  Lab 02/06/23 1757 02/07/23 0423 02/08/23 0425 02/09/23 0422 02/10/23 0225 02/10/23 0747 02/11/23 1811  NA 135  --   --   --  142  --   --   K 3.4*  --   --   --  3.0*  --   --   CL 101  --   --   --  109  --   --   CO2 23  --   --   --  20*  --   --   ANIONGAP  11  --   --   --  13  --   --   GLUCOSE 160*  --   --   --  203*  --   --   BUN 25*  --   --   --  37*  --   --   CREATININE 4.81*  --   --   --  4.88*  --   --   AST 12*  --   --   --   --   --   --   ALT <5  --   --   --   --   --   --   ALKPHOS 93  --   --   --   --   --   --   BILITOT 0.6  --   --   --   --   --   --   ALBUMIN 2.1*  --   --   --  1.7*  --   --   CRP  --  7.1* 8.0* 8.2*  --  8.0* 5.9*  PROCALCITON  --  0.53 0.63  --   --   --   --   MG 1.5*  --   --   --   --   --   --   CALCIUM 8.4*  --   --   --  7.8*  --   --     Lab Results  Component Value Date   CHOL 104 07/22/2019   HDL 30 (L) 07/22/2019   LDLCALC 37 07/22/2019   TRIG 185 (H) 07/22/2019   CHOLHDL 3.5 07/22/2019      Recent Labs  Lab 02/06/23 1757 02/07/23 0423 02/08/23 0425 02/09/23 0422 02/10/23 0225 02/10/23 0747 02/11/23 1811  CRP  --  7.1* 8.0* 8.2*  --  8.0* 5.9*  PROCALCITON  --  0.53 0.63  --   --   --   --   MG 1.5*  --   --   --   --   --   --   CALCIUM 8.4*  --   --   --  7.8*  --   --  Recent Results (from the past 240 hour(s))  MRSA Next Gen by PCR, Nasal     Status: None   Collection Time: 02/06/23  6:05 AM   Specimen: Nasal Mucosa; Nasal Swab  Result Value Ref Range Status   MRSA by PCR Next Gen NOT DETECTED NOT DETECTED Final    Comment: (NOTE) The GeneXpert MRSA Assay (FDA approved for NASAL specimens only), is one component of a comprehensive MRSA colonization surveillance program. It is not intended to diagnose MRSA infection nor to guide or monitor treatment for MRSA infections. Test performance is not FDA approved in patients less than 47 years old. Performed at [facility], [address] [place], [place] [address]      Radiology Studies: No results found.   Signature  -    Susa Raring M.D on 02/13/2023 at 8:01 AM   -  To page go to [email or web address]

## 2023-02-13 NOTE — Plan of Care (Signed)
  Problem: Education: Goal: Knowledge of General Education information will improve Description: Including pain rating scale, medication(s)/side effects and non-pharmacologic comfort measures Outcome: Progressing   Problem: Health Behavior/Discharge Planning: Goal: Ability to manage health-related needs will improve Outcome: Progressing   Problem: Clinical Measurements: Goal: Ability to maintain clinical measurements within normal limits will improve Outcome: Progressing Goal: Will remain free from infection Outcome: Progressing Goal: Diagnostic test results will improve Outcome: Progressing Goal: Respiratory complications will improve Outcome: Progressing Goal: Cardiovascular complication will be avoided Outcome: Progressing   Problem: Activity: Goal: Risk for activity intolerance will decrease Outcome: Progressing   Problem: Nutrition: Goal: Adequate nutrition will be maintained Outcome: Progressing   Problem: Coping: Goal: Level of anxiety will decrease Outcome: Progressing   Problem: Elimination: Goal: Will not experience complications related to bowel motility Outcome: Progressing Goal: Will not experience complications related to urinary retention Outcome: Progressing   Problem: Pain Management: Goal: General experience of comfort will improve Outcome: Progressing   Problem: Safety: Goal: Ability to remain free from injury will improve Outcome: Progressing   Problem: Skin Integrity: Goal: Risk for impaired skin integrity will decrease Outcome: Progressing   Problem: Education: Goal: Ability to describe self-care measures that may prevent or decrease complications (Diabetes Survival Skills Education) will improve Outcome: Progressing Goal: Individualized Educational Video(s) Outcome: Progressing   Problem: Coping: Goal: Ability to adjust to condition or change in health will improve Outcome: Progressing   Problem: Fluid Volume: Goal: Ability to  maintain a balanced intake and output will improve Outcome: Progressing   Problem: Health Behavior/Discharge Planning: Goal: Ability to identify and utilize available resources and services will improve Outcome: Progressing Goal: Ability to manage health-related needs will improve Outcome: Progressing   Problem: Metabolic: Goal: Ability to maintain appropriate glucose levels will improve Outcome: Progressing   Problem: Nutritional: Goal: Maintenance of adequate nutrition will improve Outcome: Progressing Goal: Progress toward achieving an optimal weight will improve Outcome: Progressing   Problem: Skin Integrity: Goal: Risk for impaired skin integrity will decrease Outcome: Progressing   Problem: Tissue Perfusion: Goal: Adequacy of tissue perfusion will improve Outcome: Progressing   Problem: Education: Goal: Knowledge of the prescribed therapeutic regimen will improve Outcome: Progressing Goal: Ability to verbalize activity precautions or restrictions will improve Outcome: Progressing Goal: Understanding of discharge needs will improve Outcome: Progressing   Problem: Activity: Goal: Ability to perform//tolerate increased activity and mobilize with assistive devices will improve Outcome: Progressing   Problem: Clinical Measurements: Goal: Postoperative complications will be avoided or minimized Outcome: Progressing   Problem: Self-Care: Goal: Ability to meet self-care needs will improve Outcome: Progressing   Problem: Self-Concept: Goal: Ability to maintain and perform role responsibilities to the fullest extent possible will improve Outcome: Progressing   Problem: Pain Management: Goal: Pain level will decrease with appropriate interventions Outcome: Progressing   Problem: Skin Integrity: Goal: Demonstration of wound healing without infection will improve Outcome: Progressing

## 2023-02-13 NOTE — Progress Notes (Signed)
Pt has refused the HD tx.

## 2023-02-13 NOTE — Progress Notes (Signed)
Deborah [name] Progress Note   Subjective:    Seen and examined patient at bedside. Informed by HD RN of patient refusing hemodialysis overnight. She reports not going to HD d/t ABD cramping and nausea. I explained to her these may be signs for uremia and strongly stressed the importance of going to all required treatments. She states: " I hear you and I want to speak to the Nephrologist". Next HD 12/9.  Objective Vitals:   02/12/23 1944 02/12/23 2008 02/13/23 0030 02/13/23 0606  BP: (!) 111/39 (!) 107/40 (!) 118/41 (!) 112/46  Pulse: 79 80 84 88  Resp: 18 18 18 18   Temp: 97.9 F (36.6 C) 97.8 F (36.6 C) 97.9 F (36.6 C) 99.3 F (37.4 C)  TempSrc: Oral Oral Oral Axillary  SpO2: 99% 98% 100% 100%  Weight:      Height:       Physical Exam General:Chronically ill appearing female in NAD, on RA Heart:RRR, no mrg Lungs:CTAB, nml WOB on RA Abdomen:Large, soft, NTND Extremities:R stump dressed, trace LLE edema Dialysis [facility]: [facility]    Filed Weights   02/02/23 0853 02/05/23 1520 02/05/23 1759  Weight: 113.4 kg 124.2 kg 125.1 kg    Intake/Output Summary (Last 24 hours) at 02/13/2023 0959 Last data filed at 02/13/2023 0601 Gross per 24 hour  Intake 150 ml  Output --  Net 150 ml    Additional Objective Labs: Basic Metabolic Panel: Recent Labs  Lab 02/06/23 1757 02/10/23 0225  NA 135 142  K 3.4* 3.0*  CL 101 109  CO2 23 20*  GLUCOSE 160* 203*  BUN 25* 37*  CREATININE 4.81* 4.88*  CALCIUM 8.4* 7.8*  PHOS  --  5.5*   Liver Function Tests: Recent Labs  Lab 02/06/23 1757 02/10/23 0225  AST 12*  --   ALT <5  --   ALKPHOS 93  --   BILITOT 0.6  --   PROT 6.3*  --   ALBUMIN 2.1* 1.7*   No results for input(s): "LIPASE", "AMYLASE" in the last 168 hours. CBC: Recent Labs  Lab 02/08/23 0425 02/09/23 0422 02/10/23 0225 02/11/23 1811 02/12/23 0429  WBC 17.1* 15.1* 14.8* 15.7* 15.2*  NEUTROABS 11.5* 10.2* 10.6* 10.6* 9.6*  HGB 7.8* 7.8* 7.7* 8.4*  8.0*  HCT 25.5* 24.9* 25.9* 27.4* 26.3*  MCV 94.1 93.3 94.5 94.2 94.9  PLT 324 332 353 339 322   Blood Culture    Component Value Date/Time   SDES BLOOD RIGHT HAND 01/30/2023 0436   SPECREQUEST  01/30/2023 0436    BOTTLES DRAWN AEROBIC ONLY Blood Culture results may not be optimal due to an inadequate volume of blood received in culture bottles   CULT  01/30/2023 0436    NO GROWTH 5 DAYS Performed at [facility], [address] [place], [place] [address]    REPTSTATUS 02/04/2023 FINAL 01/30/2023 0436    Cardiac Enzymes: No results for input(s): "CKTOTAL", "CKMB", "CKMBINDEX", "TROPONINI" in the last 168 hours. CBG: Recent Labs  Lab 02/11/23 1212 02/11/23 2105 02/12/23 1137 02/12/23 1635 02/12/23 2123  GLUCAP 171* 216* 159* 174* 179*   Iron Studies: No results for input(s): "IRON", "TIBC", "TRANSFERRIN", "FERRITIN" in the last 72 hours. Lab Results  Component Value Date   INR 1.9 (H) 12/15/2022   INR 1.8 (H) 11/23/2022   INR 1.1 10/08/2020   Studies/Results: MR ANKLE LEFT WO CONTRAST  Result Date: 02/11/2023 CLINICAL DATA:  Chronic ankle pain, heel wound EXAM: MRI OF THE LEFT ANKLE WITHOUT  CONTRAST TECHNIQUE: Multiplanar, multisequence MR imaging of the ankle was performed. No intravenous contrast was administered. COMPARISON:  None Available. FINDINGS: Despite efforts by the technologist and patient, motion artifact is present on today's exam and could not be eliminated. This reduces exam sensitivity and specificity. TENDONS Peroneal: Common peroneus tendon sheath tenosynovitis. Posteromedial: Distal tibialis posterior tendinopathy. Suspected small accessory navicular although less appreciable than I would expect on the T1 weighted sequence. Mild flexor hallucis longus tenosynovitis just proximal to the knot of Henry. Anterior: Unremarkable Achilles: Unremarkable Plantar Fascia: Abnormal thickening of the medial band compatible with plantar fasciitis. LIGAMENTS  Lateral: Grossly intact, obscured by motion artifact. Medial: Obscured by motion artifact, integrity of the deltoid ligament is difficult to assess. CARTILAGE Ankle Joint: Grossly unremarkable. Subtalar Joints/Sinus Tarsi: Moderate degenerative spurring. Bones: Substantial degenerative spurring at the Chopart joint and in the midfoot. No findings of osteomyelitis. Other: Subcutaneous edema anterolaterally along the ankle and tracking into the dorsum of the foot. IMPRESSION: 1. No findings of osteomyelitis. 2. Common peroneus tendon sheath tenosynovitis. 3. Distal tibialis posterior tendinopathy. 4. Mild flexor hallucis longus tenosynovitis. 5. Plantar fasciitis. 6. Subcutaneous edema anterolaterally along the ankle and tracking into the dorsum of the foot. 7. Degenerative spurring at the Chopart joint and in the midfoot. Electronically Signed   By: Gaylyn Rong M.D.   On: 02/11/2023 13:56    Medications:  cefTAZidime (FORTAZ)  IV Stopped (02/12/23 2221)   vancomycin Stopped (02/10/23 0700)   vancomycin Stopped (02/13/23 0500)    busPIRone  5 mg Oral TID   clonazepam  0.5 mg Oral BID   darbepoetin (ARANESP) injection - DIALYSIS  100 mcg Subcutaneous Q Sat-1800   feeding supplement (NEPRO CARB STEADY)  237 mL Oral Q24H   ferric citrate  210 mg Oral TID WC   Gerhardt's butt cream   Topical TID   heparin  5,000 Units Subcutaneous Q8H   insulin aspart  0-9 Units Subcutaneous TID WC   metoprolol tartrate  50 mg Oral BID   metroNIDAZOLE  500 mg Oral Q12H   midodrine  10 mg Oral TID WC   mirtazapine  15 mg Oral QHS   multivitamin  1 tablet Oral QHS   nutrition supplement (JUVEN)  1 packet Oral BID BM   oxyCODONE  10 mg Oral Q12H   pantoprazole  40 mg Oral BID   zinc sulfate (50mg  elemental zinc)  220 mg Oral Daily    Dialysis Orders: MWF [facility]  Going to start back at [facility] on d/c TTS schedule as of now  4h  400/800  121kg   3K/2.5Ca bath   TDC   Heparin none - last OP HD 11/18, post wt  124.6kg  - has been usually reaching dry wt - missed 2 sessions in last 3 wks 11/20 and 11/13 - was in hospital 10/9- 11/04 here  - dry wt lowered 6 kg 2 wks ago - venofer 100mg  q hd thru 12/04 - mircera 100 mcg q 2 wks, last 10/5, due 10/19  Assessment/Plan: Sepsis - presenting w/ SIRS, due to dehisced [name]. IV abx started and ortho consulted. S/p bka revision 11/27. ID consulted. Recommend ABX for 2weeks - Vanc & Fortaz with HD. Per primary  [name] - Noted MRI foot and ankle (-) for osteo but shows chronic changes consistent with tenosynovitis and plantar fasciitis. She's been refusing multiple studies. Dr. Lajoyce Corners following. ID recommending Doxy 100mg  BID and Augmentin 500/125mg  every day. ESKD - on HD  MWF.  Plan to resume 2nd shift schedule at [facility] on d/c.  Per Renal Navigator's recent note, MWF schedule is not available. Reports previous non compliance due to timing in the AM.  Discussed timing hard to dictate in hospital due to patient load/acuity. As outpatient only certain time slots are available, a request can be placed with outpatient staff for later time if one becomes available but latest on time are typically around 1pm.  Last HD 12/5 and she's been refusing treatment since then. Had a firm discussion with her again on the importance of going to all HD treatments. See below. Continue to counsel on importance of compliance. Plan for HD 12/9 and hopeful she agrees to go. HTN - BP in goal. On metoprolol 50mg  BID and midodrine 10mg  pre HD.  Cont meds as needed.  Volume - no gross vol excess on exam.  UF as tolerated.  Anemia of eskd - hgb 8.0. On aranesp qwk, last 11/30. MBD ckd - CCa in range. Phos at goal. Continue binders.  DM2- per primary Nutrition - Carb modified diet w/fluid restrictions. Give protein supplements. Renal Vit GOC - Wants to remain full code and continue dialysis. However, she has been refusing treatment lately. Last HD noted on 12/5. Also noted  she's been refusing multiple diagnostics as well. I had a firm discussion with her today on the importance of going to all treatments. Recommend re-visiting her goals of care if she continues to refuse. Appreciate palliative care's assistance.  Salome Holmes, NP [name] 02/13/2023,9:59 AM  LOS: 15 days

## 2023-02-14 DIAGNOSIS — Z7189 Other specified counseling: Secondary | ICD-10-CM | POA: Diagnosis not present

## 2023-02-14 DIAGNOSIS — T8781 Dehiscence of amputation stump: Secondary | ICD-10-CM | POA: Diagnosis not present

## 2023-02-14 LAB — CBC WITH DIFFERENTIAL/PLATELET
Abs Immature Granulocytes: 0.13 10*3/uL — ABNORMAL HIGH (ref 0.00–0.07)
Basophils Absolute: 0.1 10*3/uL (ref 0.0–0.1)
Basophils Relative: 1 %
Eosinophils Absolute: 0.7 10*3/uL — ABNORMAL HIGH (ref 0.0–0.5)
Eosinophils Relative: 4 %
HCT: 25.8 % — ABNORMAL LOW (ref 36.0–46.0)
Hemoglobin: 7.9 g/dL — ABNORMAL LOW (ref 12.0–15.0)
Immature Granulocytes: 1 %
Lymphocytes Relative: 21 %
Lymphs Abs: 3.4 10*3/uL (ref 0.7–4.0)
MCH: 29 pg (ref 26.0–34.0)
MCHC: 30.6 g/dL (ref 30.0–36.0)
MCV: 94.9 fL (ref 80.0–100.0)
Monocytes Absolute: 0.9 10*3/uL (ref 0.1–1.0)
Monocytes Relative: 6 %
Neutro Abs: 10.9 10*3/uL — ABNORMAL HIGH (ref 1.7–7.7)
Neutrophils Relative %: 67 %
Platelets: 353 10*3/uL (ref 150–400)
RBC: 2.72 MIL/uL — ABNORMAL LOW (ref 3.87–5.11)
RDW: 16.1 % — ABNORMAL HIGH (ref 11.5–15.5)
WBC: 16.1 10*3/uL — ABNORMAL HIGH (ref 4.0–10.5)
nRBC: 0 % (ref 0.0–0.2)

## 2023-02-14 LAB — RENAL FUNCTION PANEL
Albumin: 1.8 g/dL — ABNORMAL LOW (ref 3.5–5.0)
Anion gap: 12 (ref 5–15)
BUN: 40 mg/dL — ABNORMAL HIGH (ref 6–20)
CO2: 21 mmol/L — ABNORMAL LOW (ref 22–32)
Calcium: 7.9 mg/dL — ABNORMAL LOW (ref 8.9–10.3)
Chloride: 109 mmol/L (ref 98–111)
Creatinine, Ser: 5.5 mg/dL — ABNORMAL HIGH (ref 0.44–1.00)
GFR, Estimated: 8 mL/min — ABNORMAL LOW (ref >60)
Glucose, Bld: 126 mg/dL — ABNORMAL HIGH (ref 70–99)
Phosphorus: 6.8 mg/dL — ABNORMAL HIGH (ref 2.5–4.6)
Potassium: 3.4 mmol/L — ABNORMAL LOW (ref 3.5–5.1)
Sodium: 142 mmol/L (ref 135–145)

## 2023-02-14 LAB — GLUCOSE, CAPILLARY
Glucose-Capillary: 110 mg/dL — ABNORMAL HIGH (ref 70–99)
Glucose-Capillary: 148 mg/dL — ABNORMAL HIGH (ref 70–99)
Glucose-Capillary: 181 mg/dL — ABNORMAL HIGH (ref 70–99)
Glucose-Capillary: 179 mg/dL — ABNORMAL HIGH (ref 70–99)

## 2023-02-14 LAB — VANCOMYCIN, RANDOM: Vancomycin Rm: 16 ug/mL

## 2023-02-14 MED ORDER — PENTAFLUOROPROP-TETRAFLUOROETH EX AERO: 1.0000 | INHALATION_SPRAY | CUTANEOUS | Status: DC | PRN

## 2023-02-14 MED ORDER — LIDOCAINE-PRILOCAINE 2.5-2.5 % EX CREA
1.0000 | TOPICAL_CREAM | CUTANEOUS | Status: DC | PRN
  Filled 2023-02-14: qty 5

## 2023-02-14 MED ORDER — ANTICOAGULANT SODIUM CITRATE 4% (200MG/5ML) IV SOLN
5.0000 mL | Status: DC | PRN
  Filled 2023-02-14: qty 5

## 2023-02-14 MED ORDER — HEPARIN SODIUM (PORCINE) 1000 UNIT/ML DIALYSIS
1000.0000 [IU] | INTRAMUSCULAR | Status: DC | PRN
  Administered 2023-02-15: 1000 [IU]
  Filled 2023-02-14 (×3): qty 1

## 2023-02-14 MED ORDER — LIDOCAINE HCL (PF) 1 % IJ SOLN
5.0000 mL | INTRAMUSCULAR | Status: DC | PRN
  Filled 2023-02-14: qty 5

## 2023-02-14 MED ORDER — ALTEPLASE 2 MG IJ SOLR
2.0000 mg | Freq: Once | INTRAMUSCULAR | Status: DC | PRN
  Filled 2023-02-14: qty 2

## 2023-02-14 NOTE — Progress Notes (Signed)
PROGRESS NOTE  Deborah [name] JYN:[id number] DOB: Sep 12, 1965 DOA: 01/29/2023 PCP: Pcp, No   LOS: 16 days   Brief Narrative / Interim history:  Patient is a 57 y.o.  female with history of ESRD on HD MWF, PAF-not on any anticoagulation due to recent GI bleeding, HFpEF, DM-2, HTN, chronic pain syndrome on narcotics, narcotic seeking behavior, noncompliant with tests and treatment,-who underwent BKA 10/23-presented with a right BKA stump dehiscence, left heel cellulitis, seen by Dr. Lajoyce Corners and ID.  MRI thus far does not show any osteomyelitis of the left foot or ankle.  Subjective - Patient in bed, appears comfortable, denies any headache, no fever, no chest pain or pressure, no shortness of breath , no abdominal pain. No new focal weakness.    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 Lajoyce Corners 11/27.  Continue wound VAC.  Left heel ulcer -with likely osteomyelitis and some surrounding acute cellulitis, present on admission, Dr. Lajoyce Corners following, on IV antibiotics, MRI of left ankle and foot no osteomyelitis, discussed with Dr. Lajoyce Corners, Dr. Lajoyce Corners continue supportive care and antibiotics per ID, note patient refused MRI testing for 3 different days and it was finally done after much deliberation and counseling. ID per ID following antibiotic regimen.  Vanc + Ceftaz + oral Metronidazole x 2 weeks (EOT 12/17)  Then would start Doxy 100 mg bid + Augmentin 500/125 mg at bedtime in an attempt to suppress until surgical management can be offered.    MRI of the foot and ankle does not show any osteomyelitis, does have some chronic foot changes consistent with tenosynovitis and plantar fasciitis.  Of note patient refused the studies multiple times, will discuss the case with Dr. Lajoyce Corners.   Some narcotic  seeking behavior with history of chronic pain.  Definitely exhibiting some narcotic seeking behavior, counseled multiple times against overuse, cautiously use narcotics for acute pain with some Narcan on board.    ESRD on HD-nephrology consulted, noncompliant, refusing to go to dialysis on 02/07/2023 again on 02/11/2023 - counseled.  Patient is repeatedly refusing tests and HD treatment despite counseling.  Remains highly manipulative and narcotic seeking.  Hypokalemia-replace as indicated, continue to monitor  Generalized body tremors /jerking movement-on 11/25, no recurrence since then.  Neurology briefly evaluated, appears to be functional  History of GI bleed October 2024-thought to be due to AVMs based on the EGD.  No longer an issue, hemoglobin overall stable  History of [place]-supportive care, she would benefit from weight loss  PAF-not on anticoagulation due to recent GI bleed.  Continue metoprolol  Hypokalemia.  Dialysis patient, low-dose potassium supplementation.    Chronic diastolic CHF, EF 86% on recent echo-volume management per HD, compensated  Obesity, morbid-BMI 40.3.  She would benefit from goals  Depression/anxiety/panic attacks- continue  BuSpar  History of tardive dyskinesia- supportive care  History of malignant duodenal carcinoid-status postresection in the past  GOC -patient did mention to me this morning that she is thinking about stopping dialysis as her quality of life is quite poor.  She mentioned the same to Dr. Thedore Mins with nephrology and she is thinking about hospice, but for now wants to continue full medical treatment  She now claims on 02/12/2023 that she has become homeless since this admission and wants help with placement, however on 02/14/2023 she says that she  has found a place to go where her husband will move back in and help her out.   DM2 -controlled  Lab Results  Component Value Date   HGBA1C 5.7 (H) 02/04/2023   CBG (last 3)  Recent Labs     02/13/23 1026 02/13/23 1558 02/13/23 2105  GLUCAP 99 120* 109*   Right IJ hemodialysis catheter present.  Scheduled Meds:  busPIRone  5 mg Oral TID   clonazepam  0.5 mg Oral BID   darbepoetin (ARANESP) injection - DIALYSIS  100 mcg Subcutaneous Q Sat-1800   feeding supplement (NEPRO CARB STEADY)  237 mL Oral Q24H   ferric citrate  210 mg Oral TID WC   Gerhardt's butt cream   Topical TID   heparin  5,000 Units Subcutaneous Q8H   insulin aspart  0-9 Units Subcutaneous TID WC   metoprolol tartrate  50 mg Oral BID   metroNIDAZOLE  500 mg Oral Q12H   midodrine  10 mg Oral TID WC   mirtazapine  15 mg Oral QHS   multivitamin  1 tablet Oral QHS   nutrition supplement (JUVEN)  1 packet Oral BID BM   oxyCODONE  10 mg Oral Q12H   pantoprazole  40 mg Oral BID   zinc sulfate (50mg  elemental zinc)  220 mg Oral Daily   Continuous Infusions:  cefTAZidime (FORTAZ)  IV 0.5 g (02/13/23 2200)   vancomycin Stopped (02/10/23 0700)   PRN Meds:.acetaminophen, bisacodyl, guaiFENesin-dextromethorphan, hydrALAZINE, HYDROmorphone (DILAUDID) injection, labetalol, loperamide, LORazepam, melatonin, methocarbamol, metoprolol tartrate, naLOXone (NARCAN)  injection, [DISCONTINUED] ondansetron **OR** ondansetron (ZOFRAN) IV, mouth rinse, phenol, polyethylene glycol, senna-docusate   Diet Orders (From admission, onward)     Start     Ordered   02/07/23 1008  Diet Carb Modified Fluid consistency: Thin; Room service appropriate? Yes; Fluid restriction: 1500 mL Fluid  Diet effective now       Question Answer Comment  Diet-HS Snack? Nothing   Calorie Level Medium 1600-2000   Fluid consistency: Thin   Room service appropriate? Yes   Fluid restriction: 1500 mL Fluid      02/07/23 1007            DVT prophylaxis: SCD's Start: 02/02/23 1640 heparin injection 5,000 Units Start: 01/29/23 2200   Lab Results  Component Value Date   PLT 353 02/14/2023      Code Status: Full Code  Family Communication:  Significant other at bedside  Status is: Inpatient Remains inpatient appropriate because: Severity of illness  Level of care: Progressive  Consultants:  Nephrology Orthopedic surgery  Objective: Vitals:   02/13/23 1600 02/13/23 2109 02/13/23 2300 02/14/23 0400  BP:  112/63 (!) 118/55 122/61  Pulse: 85  91 89  Resp: 18 16 16 20   Temp:  98.2 F (36.8 C) 98.1 F (36.7 C) 97.9 F (36.6 C)  TempSrc: Oral Oral Oral Oral  SpO2:   98% 97%  Weight:      Height:        Intake/Output Summary (Last 24 hours) at 02/14/2023 0744 Last data filed at 02/14/2023 0300 Gross per 24 hour  Intake 720 ml  Output --  Net 720 ml     Wt Readings from Last 3 Encounters:  02/05/23 125.1 kg  01/08/23 121.2 kg  11/28/22 127.5 kg    Examination:  Patient in bed sleeping comfortably woken up, no focal deficits [place].AT,PERRAL Supple Neck, No JVD,   Symmetrical Chest wall movement, Good air movement bilaterally, CTAB RRR,No Gallops, Rubs or new Murmurs,  +  ve B.Sounds, Abd Soft, No tenderness,   Right BKA stump under bandage with wound VAC, left ankle under bandage, right IJ hemodialysis catheter   Data Reviewed: I have independently reviewed following labs and imaging studies   Recent Labs  Lab 02/09/23 0422 02/10/23 0225 02/11/23 1811 02/12/23 0429 02/14/23 0532  WBC 15.1* 14.8* 15.7* 15.2* 16.1*  HGB 7.8* 7.7* 8.4* 8.0* 7.9*  HCT 24.9* 25.9* 27.4* 26.3* 25.8*  PLT 332 353 339 322 353  MCV 93.3 94.5 94.2 94.9 94.9  MCH 29.2 28.1 28.9 28.9 29.0  MCHC 31.3 29.7* 30.7 30.4 30.6  RDW 15.2 15.7* 15.9* 15.9* 16.1*  LYMPHSABS 3.0 2.3 3.2 3.6 3.4  MONOABS 1.2* 1.0 0.9 1.1* 0.9  EOSABS 0.5 0.5 0.7* 0.7* 0.7*  BASOSABS 0.1 0.1 0.1 0.1 0.1    Recent Labs  Lab 02/08/23 0425 02/09/23 0422 02/10/23 0225 02/10/23 0747 02/11/23 1811 02/14/23 0532  NA  --   --  142  --   --  142  K  --   --  3.0*  --   --  3.4*  CL  --   --  109  --   --  109  CO2  --   --  20*  --   --  21*   ANIONGAP  --   --  13  --   --  12  GLUCOSE  --   --  203*  --   --  126*  BUN  --   --  37*  --   --  40*  CREATININE  --   --  4.88*  --   --  5.50*  ALBUMIN  --   --  1.7*  --   --  1.8*  CRP 8.0* 8.2*  --  8.0* 5.9*  --   PROCALCITON 0.63  --   --   --   --   --   CALCIUM  --   --  7.8*  --   --  7.9*    Lab Results  Component Value Date   CHOL 104 07/22/2019   HDL 30 (L) 07/22/2019   LDLCALC 37 07/22/2019   TRIG 185 (H) 07/22/2019   CHOLHDL 3.5 07/22/2019      Recent Labs  Lab 02/08/23 0425 02/09/23 0422 02/10/23 0225 02/10/23 0747 02/11/23 1811 02/14/23 0532  CRP 8.0* 8.2*  --  8.0* 5.9*  --   PROCALCITON 0.63  --   --   --   --   --   CALCIUM  --   --  7.8*  --   --  7.9*      Recent Results (from the past 240 hour(s))  MRSA Next Gen by PCR, Nasal     Status: None   Collection Time: 02/06/23  6:05 AM   Specimen: Nasal Mucosa; Nasal Swab  Result Value Ref Range Status   MRSA by PCR Next Gen NOT DETECTED NOT DETECTED Final    Comment: (NOTE) The GeneXpert MRSA Assay (FDA approved for NASAL specimens only), is one component of a comprehensive MRSA colonization surveillance program. It is not intended to diagnose MRSA infection nor to guide or monitor treatment for MRSA infections. Test performance is not FDA approved in patients less than 52 years old. Performed at [facility], [address], [place], [place] [address]      Radiology Studies: No results found.   Signature  -    Susa Raring M.D on 02/14/2023 at  7:44 AM   -  To page go to [email or web address]

## 2023-02-14 NOTE — TOC Progression Note (Addendum)
Transition of Care [facility]) - Progression Note    Patient Details  Name: Deborah [name] MRN: [id number] Date of Birth: 15-Oct-1965  Transition of Care [facility]) CM/SW Contact  Gordy Clement, RN Phone Number: 02/14/2023, 12:12 PM  Clinical Narrative:     2:17 PM update  Patient will be going home with Home Health services from [facility].   RNCM spoke with patient . Patient plan is to DC to home. She will require ambulance transportation. Husband will be at home to receive patient. Patient stated when she dc'd from [facility] that they ordered a hospital bed and hoyer lift and they were to be bariatric. [employer] delivered standard sized equipment and patient states they don't work. Patient stated the hoyer lift tips off the floor when she gets in it . RNCM has requested a bariatric replacement of these items.   RNCM has completed Med Nec for for PTAR  and is in chart   Expected Discharge Plan: Home w Home Health Services Barriers to Discharge: Continued Medical Work up  Expected Discharge Plan and Services In-house Referral: Hospice / Palliative Care   Post Acute Care Choice: Home Health Living arrangements for the past 2 months: Apartment                                       Social Determinants of Health (SDOH) Interventions SDOH Screenings   Food Insecurity: No Food Insecurity (01/30/2023)  Recent Concern: Food Insecurity - High Risk (01/25/2023)   Received from [facility]  Housing: Patient Declined (01/30/2023)  Transportation Needs: No Transportation Needs (01/30/2023)  Recent Concern: Transportation Needs - Unmet Transportation Needs (01/25/2023)   Received from [facility]  Utilities: Not At Risk (01/30/2023)  Recent Concern: Utilities - Medium Risk (01/25/2023)   Received from [facility]  Depression (PHQ2-9): High Risk (11/18/2022)  Financial Resource Strain: Low Risk (03/23/2021)   Received from [facility])  Physical Activity: Not  on File (10/31/2017)   Received from [place], [place]  Social Connections: Unknown (07/19/2021)   Received from [facility]  Stress: Low Risk (03/23/2021)   [facility] from [facility])  Tobacco Use: Low Risk  (02/02/2023)   Received from [facility]    Readmission Risk Interventions    11/25/2022    5:16 PM 07/05/2022    1:20 PM  Readmission Risk Prevention Plan  Transportation Screening Complete Complete  Medication Review (RN Care Manager) Complete Referral to Pharmacy  PCP or Specialist appointment within 3-5 days of discharge Complete Complete  HRI or Home Care Consult Complete Complete  SW Recovery Care/Counseling Consult Complete Complete  Palliative Care Screening Not Applicable Not Applicable  Skilled Nursing Facility Not Applicable Not Applicable

## 2023-02-14 NOTE — Progress Notes (Signed)
[facility] Progress Note   Subjective:   Reports her back is itchy today. Denies SOB, CP, dizziness, nausea. Says she will go to HD today.   Objective Vitals:   02/13/23 2109 02/13/23 2300 02/14/23 0400 02/14/23 0840  BP: 112/63 (!) 118/55 122/61 100/60  Pulse:  91 89 85  Resp: 16 16 20 20   Temp: 98.2 F (36.8 C) 98.1 F (36.7 C) 97.9 F (36.6 C) 97.9 F (36.6 C)  TempSrc: Oral Oral Oral Oral  SpO2:  98% 97%   Weight:      Height:       Physical Exam General: Chronically ill appearing female, alert and in NAD Heart: RRR, no murmurs, rubs or gallops Lungs: CTA bilaterally, respirations unlabored on RA Abdomen: Soft, non-distended, +BS Extremities: Trace edema b/l lower extremities Dialysis Access:  [facility]  Additional Objective Labs: Basic Metabolic Panel: Recent Labs  Lab 02/10/23 0225 02/14/23 0532  NA 142 142  K 3.0* 3.4*  CL 109 109  CO2 20* 21*  GLUCOSE 203* 126*  BUN 37* 40*  CREATININE 4.88* 5.50*  CALCIUM 7.8* 7.9*  PHOS 5.5* 6.8*   Liver Function Tests: Recent Labs  Lab 02/10/23 0225 02/14/23 0532  ALBUMIN 1.7* 1.8*   No results for input(s): "LIPASE", "AMYLASE" in the last 168 hours. CBC: Recent Labs  Lab 02/09/23 0422 02/10/23 0225 02/11/23 1811 02/12/23 0429 02/14/23 0532  WBC 15.1* 14.8* 15.7* 15.2* 16.1*  NEUTROABS 10.2* 10.6* 10.6* 9.6* 10.9*  HGB 7.8* 7.7* 8.4* 8.0* 7.9*  HCT 24.9* 25.9* 27.4* 26.3* 25.8*  MCV 93.3 94.5 94.2 94.9 94.9  PLT 332 353 339 322 353   Blood Culture    Component Value Date/Time   SDES BLOOD RIGHT HAND 01/30/2023 0436   SPECREQUEST  01/30/2023 0436    BOTTLES DRAWN AEROBIC ONLY Blood Culture results may not be optimal due to an inadequate volume of blood received in culture bottles   CULT  01/30/2023 0436    NO GROWTH 5 DAYS Performed at [facility], [address], [place], [place] [address]    REPTSTATUS 02/04/2023 FINAL 01/30/2023 0436    Cardiac Enzymes: No results for  input(s): "CKTOTAL", "CKMB", "CKMBINDEX", "TROPONINI" in the last 168 hours. CBG: Recent Labs  Lab 02/12/23 2123 02/13/23 1026 02/13/23 1558 02/13/23 2105 02/14/23 0838  GLUCAP 179* 99 120* 109* 110*   Iron Studies: No results for input(s): "IRON", "TIBC", "TRANSFERRIN", "FERRITIN" in the last 72 hours. @lablastinr3 @ Studies/Results: No results found. Medications:  anticoagulant sodium citrate     cefTAZidime (FORTAZ)  IV 0.5 g (02/13/23 2200)   vancomycin Stopped (02/10/23 0700)    busPIRone  5 mg Oral TID   clonazepam  0.5 mg Oral BID   darbepoetin (ARANESP) injection - DIALYSIS  100 mcg Subcutaneous Q Sat-1800   feeding supplement (NEPRO CARB STEADY)  237 mL Oral Q24H   ferric citrate  210 mg Oral TID WC   Gerhardt's butt cream   Topical TID   heparin  5,000 Units Subcutaneous Q8H   insulin aspart  0-9 Units Subcutaneous TID WC   metoprolol tartrate  50 mg Oral BID   metroNIDAZOLE  500 mg Oral Q12H   midodrine  10 mg Oral TID WC   mirtazapine  15 mg Oral QHS   multivitamin  1 tablet Oral QHS   nutrition supplement (JUVEN)  1 packet Oral BID BM   oxyCODONE  10 mg Oral Q12H   pantoprazole  40 mg Oral BID  zinc sulfate (50mg  elemental zinc)  220 mg Oral Daily    Outpatient Dialysis Orders: MWF [facility]  Going to start back at [facility] on d/c TTS schedule as of now  4h  400/800  121kg   3K/2.5Ca bath   TDC   Heparin none - last OP HD 11/18, post wt 124.6kg  - has been usually reaching dry wt - missed 2 sessions in last 3 wks 11/20 and 11/13 - was in hospital 10/9- 11/04 here  - dry wt lowered 6 kg 2 wks ago - venofer 100mg  q hd thru 12/04 - mircera 100 mcg q 2 wks, last 10/5, due 10/19  Assessment/Plan: Sepsis - presenting w/ SIRS, due to dehisced [name]. IV abx started and ortho consulted. S/p bka revision 11/27. ID consulted. Recommend ABX for 2weeks - Vanc & Fortaz with HD. Per primary  [name] - Noted MRI foot and ankle (-) for osteo but shows chronic changes  consistent with tenosynovitis and plantar fasciitis. She's been refusing multiple studies. Dr. Lajoyce Corners following. ID recommending Doxy 100mg  BID and Augmentin 500/125mg  every day. ESKD - on HD MWF.  Plan to resume 2nd shift schedule at [facility] TTS on d/c.  Per Renal Navigator's recent note, MWF schedule is not available. Reports previous non compliance due to timing in the AM.  Discussed timing hard to dictate in hospital due to patient load/acuity. As outpatient only certain time slots are available, a request can be placed with outpatient staff for later time if one becomes available but latest on time are typically around 1pm.  Last HD 12/5 and she's been refusing treatment since then. Had a firm discussion with her again on the importance of going to all HD treatments. See below. Says she will go to HD today HTN - BP in goal. On metoprolol 50mg  BID and midodrine 10mg  pre HD.  Cont meds as needed.  Volume - Trace edema on exam.  UF as tolerated.  Anemia of eskd - hgb 7.9. On aranesp qwk, last 11/30. MBD ckd - CCa in range. Phos elevated. Continue binders.  DM2- per primary Nutrition - Carb modified diet w/fluid restrictions. Give protein supplements. Renal Vit GOC - Wants to remain full code and continue dialysis. However, she has been refusing treatment lately. Last HD noted on 12/5. Also noted she's been refusing multiple diagnostics as well. I had a firm discussion with her today on the importance of going to all treatments. Recommend re-visiting her goals of care if she continues to refuse. Appreciate palliative care's assistance.    Rogers Blocker, PA-C 02/14/2023, 11:19 AM  [facility] Pager: [phone]

## 2023-02-14 NOTE — TOC CM/SW Note (Signed)
Patient will need bariatric hospital bed and hoyer lift due to body habitus preventing patient from fitting in regular sized bed/hoyer lift.

## 2023-02-14 NOTE — Plan of Care (Signed)
  Problem: Education: Goal: Knowledge of General Education information will improve Description Including pain rating scale, medication(s)/side effects and non-pharmacologic comfort measures Outcome: Progressing   

## 2023-02-15 ENCOUNTER — Other Ambulatory Visit (HOSPITAL_COMMUNITY): Payer: Self-pay

## 2023-02-15 DIAGNOSIS — Z7189 Other specified counseling: Secondary | ICD-10-CM | POA: Diagnosis not present

## 2023-02-15 DIAGNOSIS — Z515 Encounter for palliative care: Secondary | ICD-10-CM | POA: Diagnosis not present

## 2023-02-15 DIAGNOSIS — T8781 Dehiscence of amputation stump: Secondary | ICD-10-CM | POA: Diagnosis not present

## 2023-02-15 DIAGNOSIS — Z5189 Encounter for other specified aftercare: Secondary | ICD-10-CM | POA: Diagnosis not present

## 2023-02-15 LAB — GLUCOSE, CAPILLARY
Glucose-Capillary: 233 mg/dL — ABNORMAL HIGH (ref 70–99)
Glucose-Capillary: 211 mg/dL — ABNORMAL HIGH (ref 70–99)

## 2023-02-15 MED ORDER — DEXTROSE 5 % IV SOLN: 0.5000 g | INTRAVENOUS | Status: DC

## 2023-02-15 MED ORDER — HYDROMORPHONE HCL 4 MG PO TABS: 4.0000 mg | ORAL_TABLET | ORAL | 0 refills | Status: DC

## 2023-02-15 MED ORDER — HYDROMORPHONE HCL 4 MG PO TABS
4.0000 mg | ORAL_TABLET | ORAL | 0 refills | Status: DC
  Filled 2023-02-15: qty 6, 2d supply, fill #0

## 2023-02-15 MED ORDER — DOXYCYCLINE HYCLATE 100 MG PO TABS
100.0000 mg | ORAL_TABLET | Freq: Two times a day (BID) | ORAL | 0 refills | Status: DC
  Filled 2023-02-15: qty 30, 15d supply, fill #0

## 2023-02-15 MED ORDER — VANCOMYCIN HCL IN DEXTROSE 1-5 GM/200ML-% IV SOLN: 1000.0000 mg | INTRAVENOUS | Status: DC

## 2023-02-15 MED ORDER — METRONIDAZOLE 500 MG PO TABS
500.0000 mg | ORAL_TABLET | Freq: Two times a day (BID) | ORAL | 1 refills | Status: DC
  Filled 2023-02-15: qty 40, 20d supply, fill #0

## 2023-02-15 MED ORDER — CEFADROXIL 500 MG PO CAPS
500.0000 mg | ORAL_CAPSULE | Freq: Every day | ORAL | 0 refills | Status: DC
  Filled 2023-02-15: qty 14, 14d supply, fill #0

## 2023-02-15 NOTE — Progress Notes (Signed)
   02/15/23 0200  Vitals  BP 112/60  BP Location Right Arm  BP Method Automatic  Patient Position (if appropriate) Lying  Pulse Rate 100  Pulse Rate Source Monitor  ECG Heart Rate 100  Resp 15  Oxygen Therapy  SpO2 100 %  During Treatment Monitoring  Blood Flow Rate (mL/min) 0 mL/min  Arterial Pressure (mmHg) -1.61 mmHg  Venous Pressure (mmHg) -0.61 mmHg  TMP (mmHg) -48.68 mmHg  Ultrafiltration Rate (mL/min) 950 mL/min  Dialysate Flow Rate (mL/min) 299 ml/min  Dialysate Potassium Concentration 3  Duration of HD Treatment -hour(s) 3.25 hour(s)  Cumulative Fluid Removed (mL) per Treatment  1987.53  HD Safety Checks Performed Yes  Intra-Hemodialysis Comments Progressing as prescribed  Post Treatment  Dialyzer Clearance Clotted  Liters Processed 77.9  Fluid Removed (mL) 2000 mL  Tolerated HD Treatment Yes  Hemodialysis Catheter Right Subclavian Double lumen Permanent (Tunneled)  Placement Date/Time: 06/24/22 1644   Serial / Lot #: [id number]  Expiration Date: 09/08/26  Time Out: Correct patient;Correct site;Correct procedure  Maximum sterile barrier precautions: Hand hygiene;Mask;Cap;Sterile gloves;Sterile gown;Large sterile ...  Blue Lumen Status Flushed;Heparin locked  Red Lumen Status Flushed;Heparin locked  Catheter fill solution Heparin 1000 units/ml  Catheter fill volume (Arterial) 2 cc  Catheter fill volume (Venous) 2  Dressing Type Transparent  Dressing Status Clean, Dry, Intact  Drainage Description None  Dressing Change Due 02/20/23  Post treatment catheter status Capped and Clamped   At 3 hours and 15 minutes dialysis filter clotted. Unable to return blood. Pt decline to continue treatment.Message sent to Nephrologist on call. Pt stable.

## 2023-02-15 NOTE — Plan of Care (Signed)
  Problem: Education: Goal: Knowledge of General Education information will improve Description: Including pain rating scale, medication(s)/side effects and non-pharmacologic comfort measures Outcome: Completed/Met   Problem: Health Behavior/Discharge Planning: Goal: Ability to manage health-related needs will improve Outcome: Completed/Met   Problem: Clinical Measurements: Goal: Ability to maintain clinical measurements within normal limits will improve Outcome: Completed/Met Goal: Will remain free from infection Outcome: Completed/Met Goal: Diagnostic test results will improve Outcome: Completed/Met Goal: Respiratory complications will improve Outcome: Completed/Met Goal: Cardiovascular complication will be avoided Outcome: Completed/Met   Problem: Activity: Goal: Risk for activity intolerance will decrease Outcome: Completed/Met   Problem: Nutrition: Goal: Adequate nutrition will be maintained Outcome: Completed/Met   Problem: Coping: Goal: Level of anxiety will decrease Outcome: Completed/Met   Problem: Elimination: Goal: Will not experience complications related to bowel motility Outcome: Completed/Met Goal: Will not experience complications related to urinary retention Outcome: Completed/Met   Problem: Pain Management: Goal: General experience of comfort will improve Outcome: Completed/Met   Problem: Safety: Goal: Ability to remain free from injury will improve Outcome: Completed/Met   Problem: Skin Integrity: Goal: Risk for impaired skin integrity will decrease Outcome: Completed/Met   Problem: Education: Goal: Ability to describe self-care measures that may prevent or decrease complications (Diabetes Survival Skills Education) will improve Outcome: Completed/Met Goal: Individualized Educational Video(s) Outcome: Completed/Met   Problem: Coping: Goal: Ability to adjust to condition or change in health will improve Outcome: Completed/Met   Problem:  Fluid Volume: Goal: Ability to maintain a balanced intake and output will improve Outcome: Completed/Met   Problem: Health Behavior/Discharge Planning: Goal: Ability to identify and utilize available resources and services will improve Outcome: Completed/Met Goal: Ability to manage health-related needs will improve Outcome: Completed/Met   Problem: Metabolic: Goal: Ability to maintain appropriate glucose levels will improve Outcome: Completed/Met   Problem: Nutritional: Goal: Maintenance of adequate nutrition will improve Outcome: Completed/Met Goal: Progress toward achieving an optimal weight will improve Outcome: Completed/Met   Problem: Skin Integrity: Goal: Risk for impaired skin integrity will decrease Outcome: Completed/Met   Problem: Tissue Perfusion: Goal: Adequacy of tissue perfusion will improve Outcome: Completed/Met   Problem: Education: Goal: Knowledge of the prescribed therapeutic regimen will improve Outcome: Completed/Met Goal: Ability to verbalize activity precautions or restrictions will improve Outcome: Completed/Met Goal: Understanding of discharge needs will improve Outcome: Completed/Met   Problem: Activity: Goal: Ability to perform//tolerate increased activity and mobilize with assistive devices will improve Outcome: Completed/Met   Problem: Clinical Measurements: Goal: Postoperative complications will be avoided or minimized Outcome: Completed/Met   Problem: Self-Care: Goal: Ability to meet self-care needs will improve Outcome: Completed/Met   Problem: Self-Concept: Goal: Ability to maintain and perform role responsibilities to the fullest extent possible will improve Outcome: Completed/Met   Problem: Pain Management: Goal: Pain level will decrease with appropriate interventions Outcome: Completed/Met   Problem: Skin Integrity: Goal: Demonstration of wound healing without infection will improve Outcome: Completed/Met

## 2023-02-15 NOTE — Progress Notes (Signed)
[facility] filled scripts for pt and were delivered to pt in her room, medication bag was placed in her belonging bag to take home.   Annice Needy, RN SWOT

## 2023-02-15 NOTE — TOC Transition Note (Signed)
Transition of Care [facility]) - CM/SW Discharge Note   Patient Details  Name: Deborah [name] MRN: [id number] Date of Birth: [birth year]  Transition of Care [facility]) CM/SW Contact:  Gordy Clement, RN Phone Number: 02/15/2023, 1:53 PM   Clinical Narrative:     Patient will DC to home today. [facility] will transport- Mother will be at the house waiting. [facility] will provide PT/RN and AIDE. AVS updated       Final next level of care: Home w Home Health Services Barriers to Discharge: Continued Medical Work up   Patient Goals and CMS Choice      Discharge Placement                         Discharge Plan and Services Additional resources added to the After Visit Summary for   In-house Referral: Hospice / Palliative Care   Post Acute Care Choice: Home Health                               Social Determinants of Health (SDOH) Interventions SDOH Screenings   Food Insecurity: No Food Insecurity (01/30/2023)  Recent Concern: Food Insecurity - High Risk (01/25/2023)   Received from [facility]  Housing: Patient Declined (01/30/2023)  Transportation Needs: No Transportation Needs (01/30/2023)  Recent Concern: Transportation Needs - Unmet Transportation Needs (01/25/2023)   Received from [facility]  Utilities: Not At Risk (01/30/2023)  Recent Concern: Utilities - Medium Risk (01/25/2023)   Received from [facility]  Depression (PHQ2-9): High Risk (11/18/2022)  Financial Resource Strain: Low Risk (03/23/2021)   Received from [facility])  Physical Activity: Not on File (10/31/2017)   Received from [place], [place]  Social Connections: Unknown (07/19/2021)   Received from [facility], [facility]  Stress: Low Risk (03/23/2021)   Received from [facility])  Tobacco Use: Low Risk  (02/02/2023)   Received from [facility]     Readmission Risk Interventions    11/25/2022    5:16  PM 07/05/2022    1:20 PM  Readmission Risk Prevention Plan  Transportation Screening Complete Complete  Medication Review (RN Care Manager) Complete Referral to Pharmacy  PCP or Specialist appointment within 3-5 days of discharge Complete Complete  HRI or Home Care Consult Complete Complete  SW Recovery Care/Counseling Consult Complete Complete  Palliative Care Screening Not Applicable Not Applicable  Skilled Nursing Facility Not Applicable Not Applicable

## 2023-02-15 NOTE — Progress Notes (Signed)
[facility] Progress Note   Subjective:   Pt denies SOB, CP, dizziness ,nausea. BP a bit soft this AM but asymptomatic. Appears she may be going home.   Objective Vitals:   02/15/23 0145 02/15/23 0200 02/15/23 0600 02/15/23 0827  BP: (!) 99/55 112/60 114/67 (!) 89/61  Pulse: (!) 103 100  (!) 113  Resp: 20 15 18 18   Temp:   97.9 F (36.6 C) 98.2 F (36.8 C)  TempSrc:   Oral Oral  SpO2: 100% 100% 100%   Weight:      Height:       Physical Exam  General: Chronically ill appearing female, alert and in NAD Heart: RRR, no murmurs, rubs or gallops Lungs: CTA bilaterally, respirations unlabored on RA Abdomen: Soft, non-distended, +BS Extremities: Trace edema b/l lower extremities Dialysis Access:  [facility]  Additional Objective Labs: Basic Metabolic Panel: Recent Labs  Lab 02/10/23 0225 02/14/23 0532  NA 142 142  K 3.0* 3.4*  CL 109 109  CO2 20* 21*  GLUCOSE 203* 126*  BUN 37* 40*  CREATININE 4.88* 5.50*  CALCIUM 7.8* 7.9*  PHOS 5.5* 6.8*   Liver Function Tests: Recent Labs  Lab 02/10/23 0225 02/14/23 0532  ALBUMIN 1.7* 1.8*   No results for input(s): "LIPASE", "AMYLASE" in the last 168 hours. CBC: Recent Labs  Lab 02/09/23 0422 02/10/23 0225 02/11/23 1811 02/12/23 0429 02/14/23 0532  WBC 15.1* 14.8* 15.7* 15.2* 16.1*  NEUTROABS 10.2* 10.6* 10.6* 9.6* 10.9*  HGB 7.8* 7.7* 8.4* 8.0* 7.9*  HCT 24.9* 25.9* 27.4* 26.3* 25.8*  MCV 93.3 94.5 94.2 94.9 94.9  PLT 332 353 339 322 353   Blood Culture    Component Value Date/Time   SDES BLOOD RIGHT HAND 01/30/2023 0436   SPECREQUEST  01/30/2023 0436    BOTTLES DRAWN AEROBIC ONLY Blood Culture results may not be optimal due to an inadequate volume of blood received in culture bottles   CULT  01/30/2023 0436    NO GROWTH 5 DAYS Performed at [facility], [address], [place], [place] [address]    REPTSTATUS 02/04/2023 FINAL 01/30/2023 0436    Cardiac Enzymes: No results for input(s):  "CKTOTAL", "CKMB", "CKMBINDEX", "TROPONINI" in the last 168 hours. CBG: Recent Labs  Lab 02/14/23 0838 02/14/23 1234 02/14/23 1534 02/14/23 2146 02/15/23 0827  GLUCAP 110* 148* 181* 179* 233*   Iron Studies: No results for input(s): "IRON", "TIBC", "TRANSFERRIN", "FERRITIN" in the last 72 hours. @lablastinr3 @ Studies/Results: No results found. Medications:  cefTAZidime (FORTAZ)  IV 0.5 g (02/13/23 2200)   vancomycin Stopped (02/10/23 0700)    busPIRone  5 mg Oral TID   clonazepam  0.5 mg Oral BID   darbepoetin (ARANESP) injection - DIALYSIS  100 mcg Subcutaneous Q Sat-1800   feeding supplement (NEPRO CARB STEADY)  237 mL Oral Q24H   ferric citrate  210 mg Oral TID WC   Gerhardt's butt cream   Topical TID   heparin  5,000 Units Subcutaneous Q8H   insulin aspart  0-9 Units Subcutaneous TID WC   metoprolol tartrate  50 mg Oral BID   metroNIDAZOLE  500 mg Oral Q12H   midodrine  10 mg Oral TID WC   mirtazapine  15 mg Oral QHS   multivitamin  1 tablet Oral QHS   nutrition supplement (JUVEN)  1 packet Oral BID BM   oxyCODONE  10 mg Oral Q12H   pantoprazole  40 mg Oral BID    Dialysis Orders: MWF [facility]  Going to start back at [facility] on d/c TTS schedule as of now  4h  400/800  121kg   3K/2.5Ca bath   TDC   Heparin none - last OP HD 11/18, post wt 124.6kg  - has been usually reaching dry wt - missed 2 sessions in last 3 wks 11/20 and 11/13 - was in hospital 10/9- 11/04 here  - dry wt lowered 6 kg 2 wks ago - venofer 100mg  q hd thru 12/04 - mircera 100 mcg q 2 wks, last 10/5, due 10/19  Assessment/Plan: Sepsis - presenting w/ SIRS, due to dehisced [name]. IV abx started and ortho consulted. S/p bka revision 11/27. ID consulted. Recommend ABX for 2weeks - Vanc & Fortaz with HD. Per primary  [name] - Noted MRI foot and ankle (-) for osteo but shows chronic changes consistent with tenosynovitis and plantar fasciitis. She's been refusing multiple studies. Dr. Lajoyce Corners following.  ID recommending Doxy 100mg  BID and Augmentin 500/125mg  every day. ESKD - on HD MWF but plan to resume 2nd shift schedule at [facility] TTS on d/c.  Per Renal Navigator's recent note, MWF schedule is not available. Has intermittently been refusing HD here, did go yesterday. No emergent indications for HD today, ok for outpatient HD Thursday/Sat this week.  HTN - BP in goal. On metoprolol 50mg  BID and midodrine 10mg  pre HD.  Cont meds as needed.  Volume - Trace edema on exam.  UF as tolerated.  Anemia of eskd - hgb 7.9. On aranesp qwk, last 11/30. MBD ckd - CCa in range. Phos elevated. Continue binders.  DM2- per primary Nutrition - Carb modified diet w/fluid restrictions. Give protein supplements. Renal Vit GOC - Wants to remain full code and continue dialysis. Discussed the importance of compliance with medications and HD    Rogers Blocker, PA-C 02/15/2023, 11:57 AM  [facility] Pager: [phone]

## 2023-02-15 NOTE — Progress Notes (Signed)
Daily Progress Note   Patient Name: Deborah [name]       Date: 02/15/2023 DOB: 04/07/1965  Age: 57 y.o. MRN#: [id number] Attending Physician: Leroy Sea, MD Primary Care Physician: Pcp, No Admit Date: 01/29/2023 Length of Stay: 17 days  Reason for Consultation/Follow-up: Establishing goals of care  HPI/Patient Profile:  57 y.o. female  with past medical history of ESRD on HD MWF, PAF-not on any anticoagulation due to recent GI bleeding, HFpEF, DM-2, HTN, chronic pain syndrome on narcotics-who underwent BKA 10/23-presented with a right BKA stump dehiscence.   Since admission the patient has been non-compliant with iHD and stated to a couple providers about not wanting to do dialysis anymore.  PMT was consulted for GOC conversations.  Subjective:   Subjective: Chart Reviewed. Updates received. Patient Assessed. Created space and opportunity for patient  and family to explore thoughts and feelings regarding current medical situation.  Today's Discussion: Today saw the patient at the bedside.  She was sitting at the edge of the bed while nursing was changing her BKA stump dressing.  I took time to assist the nurse.  We discussed her previous visits with palliative medicine including over this weekend as well as her visit with hospice.  I confirmed that her decision is to continue with outpatient dialysis and continued medical care.  She is anticipating being discharged today to home with home health.  I shared my recommendation, again, that palliative medicine to see her as an outpatient if she is not electing hospice.  They can help assist her with discussions in the future related to types of care that she would want.  If she decides in the future to again consider discontinuing dialysis, they can specifically assist with this.  She is in agreement with outpatient palliative care.  I provided emotional and general support through therapeutic listening, empathy, sharing of stories,  therapeutic touch, and other techniques. I answered all questions and addressed all concerns to the best of my ability.  I did reach out to [facility] and confirmed that [employer], whom she has already elected, is on board and aware of referral for palliative care as an outpatient.  Review of Systems  Respiratory:  Negative for chest tightness and shortness of breath.   Cardiovascular:  Negative for chest pain.  Gastrointestinal:  Negative for abdominal pain, nausea and vomiting.  Musculoskeletal:        Admits stump pain, although it is tolerable at this time    Objective:   Vital Signs:  BP (!) 85/50 (BP Location: Right Wrist)   Pulse (!) 110   Temp 98 F (36.7 C) (Oral)   Resp 18   Ht 5\' 6"  (1.676 m)   Wt 125.1 kg   LMP 10/10/2012   SpO2 100%   BMI 44.51 kg/m   Physical Exam Vitals and nursing note reviewed.  Constitutional:      General: She is not in acute distress.    Appearance: She is obese. She is ill-appearing. She is not toxic-appearing.  HENT:     Head: Normocephalic and atraumatic.  Pulmonary:     Effort: Pulmonary effort is normal. No respiratory distress.  Musculoskeletal:     Comments: left heel with dressing on     Right Lower Extremity: Right leg is amputated below knee.  Skin:    General: Skin is warm and dry.  Neurological:     General: No focal deficit present.     Mental Status: She is alert.  Psychiatric:        Mood and Affect: Mood normal.        Behavior: Behavior normal.     Palliative Assessment/Data: 50%    Existing Vynca/ACP Documentation: None  Assessment & Plan:   Impression: Present on Admission:  Dehiscence of amputation stump of right lower extremity (HCC)  57 year old female with acute presentation and chronic comorbidities as described above.  The patient is currently admitted with BKA stump infection/dehiscence, ESRD on hemodialysis.  She has been declining hemodialysis over the past couple days and is stated  that she does not want to continue.  We had an extensive discussion today and she is in agreement to continue hemodialysis.  She understands that at any point that she chooses not to continue dialysis that is a possible option.  She did meet with hospice liaison and palliative medicine team over the weekend as well.  She is decided to continue hemodialysis and medical treatment, but is aware that she can make changes to her goals at any point.  I have recommended palliative care in the outpatient setting after discharge to follow for clinical progress and assist with further discussions, which he agrees to.  Overall prognosis guarded to poor.  SUMMARY OF RECOMMENDATIONS   Remain full code Full scope of treatment for now Continue hemodialysis Appreciate social work extensive effort on outpatient dialysis and transportation Confirmed outpatient palliative care with [facility] liaison Anticipate discharge later today Palliative medicine will sign off  Symptom Management:  Per primary team PMT is available to assist as needed  Code Status: Full code  Prognosis: Unable to determine  Discharge Planning: Home with Home Health and outpatient palliative care  Discussed with: Patient, medical team, nursing team, [facility]  Thank you for allowing [place] to participate in the care of [facility] PMT will continue to support holistically.  Time Total: 45 min  Detailed review of medical records (labs, imaging, vital signs), medically appropriate exam, discussed with treatment team, counseling and education to patient, family, & staff, documenting clinical information, medication management, coordination of care  Wynne Dust, NP Palliative Medicine Team  Team Phone # [phone] (Nights/Weekends)  11/04/2020, 8:17 AM

## 2023-02-15 NOTE — Discharge Instructions (Addendum)
Follow with Primary MD  in 7 days   Get CBC, CMP, 2 view Chest X ray -  checked next visit with your primary MD   Activity: Weightbearing in the left leg with the cam boot provided with Full fall precautions use walker/cane & assistance as needed  Disposition Home   Diet: Renal-low carbohydrate diet.  Strict 1.5 L fluid restriction per day.  Check CBGs q. [facility] S.  Special Instructions: If you have smoked or chewed Tobacco  in the last 2 yrs please stop smoking, stop any regular Alcohol  and or any Recreational drug use.  On your next visit with your primary care physician please Get Medicines reviewed and adjusted.  Please request your Prim.MD to go over all Hospital Tests and Procedure/Radiological results at the follow up, please get all Hospital records sent to your Prim MD by signing hospital release before you go home.  If you experience worsening of your admission symptoms, develop shortness of breath, life threatening emergency, suicidal or homicidal thoughts you must seek medical attention immediately by calling 911 or calling your MD immediately  if symptoms less severe.  You Must read complete instructions/literature along with all the possible adverse reactions/side effects for all the Medicines you take and that have been prescribed to you. Take any new Medicines after you have completely understood and accpet all the possible adverse reactions/side effects.   Do not drive when taking Pain medications.  Do not take more than prescribed Pain, Sleep and Anxiety Medications

## 2023-02-15 NOTE — Discharge Planning (Signed)
[facility] Patient Discharge Orders- [facility] CLINIC: [place]  Patient's name: Deborah [name] Admit/DC Dates: 01/29/2023 - 02/15/23  Discharge Diagnoses: Sepsis due to [name] infection  ESRD w/ noncompliance  Aranesp: Given: Yes   Date and amount of last dose: on 02/12/23 Last Hgb: 7.9 PRBC's Given: no Date/# of units: N/A ESA dose for discharge: mircera 200 mcg IV q 2 weeks  IV Iron dose at discharge: none  Heparin change: no  EDW Change: Yes New EDW: 124kg  Bath Change: No (3K 2.5Ca)  Access intervention/Change: No Details: TDC  Hectorol/Calcitriol change: none  Discharge Labs: Calcium 7.9 Phosphorus 6.8 Albumin 1.8 K+ 3.4  IV Antibiotics: Yes!!! Details: Vancomycin 1g IV q HD and ceftazidime 1g IV q HD- last day 02/22/23  On Coumadin?: no Last INR: Next INR: Managed By:   OTHER/APPTS/LAB ORDERS: BFR 400 DFR 800    D/C Meds to be reconciled by nurse after every discharge.  Completed By: Rogers Blocker, PA-C 02/15/2023, 2:28 PM  [facility] Pager: [phone]   Reviewed by: MD:______ RN_______

## 2023-02-15 NOTE — Plan of Care (Signed)
  Problem: Education: Goal: Knowledge of General Education information will improve Description: Including pain rating scale, medication(s)/side effects and non-pharmacologic comfort measures Outcome: Progressing   Problem: Health Behavior/Discharge Planning: Goal: Ability to manage health-related needs will improve Outcome: Progressing   Problem: Clinical Measurements: Goal: Ability to maintain clinical measurements within normal limits will improve Outcome: Progressing Goal: Will remain free from infection Outcome: Progressing Goal: Diagnostic test results will improve Outcome: Progressing Goal: Respiratory complications will improve Outcome: Progressing Goal: Cardiovascular complication will be avoided Outcome: Progressing   Problem: Activity: Goal: Risk for activity intolerance will decrease Outcome: Progressing   Problem: Nutrition: Goal: Adequate nutrition will be maintained Outcome: Progressing   Problem: Coping: Goal: Level of anxiety will decrease Outcome: Progressing   Problem: Elimination: Goal: Will not experience complications related to bowel motility Outcome: Progressing Goal: Will not experience complications related to urinary retention Outcome: Progressing   Problem: Pain Management: Goal: General experience of comfort will improve Outcome: Progressing   Problem: Safety: Goal: Ability to remain free from injury will improve Outcome: Progressing   Problem: Skin Integrity: Goal: Risk for impaired skin integrity will decrease Outcome: Progressing   Problem: Education: Goal: Ability to describe self-care measures that may prevent or decrease complications (Diabetes Survival Skills Education) will improve Outcome: Progressing Goal: Individualized Educational Video(s) Outcome: Progressing   Problem: Coping: Goal: Ability to adjust to condition or change in health will improve Outcome: Progressing   Problem: Fluid Volume: Goal: Ability to  maintain a balanced intake and output will improve Outcome: Progressing   Problem: Health Behavior/Discharge Planning: Goal: Ability to identify and utilize available resources and services will improve Outcome: Progressing Goal: Ability to manage health-related needs will improve Outcome: Progressing   Problem: Metabolic: Goal: Ability to maintain appropriate glucose levels will improve Outcome: Progressing   Problem: Nutritional: Goal: Maintenance of adequate nutrition will improve Outcome: Progressing Goal: Progress toward achieving an optimal weight will improve Outcome: Progressing   Problem: Skin Integrity: Goal: Risk for impaired skin integrity will decrease Outcome: Progressing   Problem: Tissue Perfusion: Goal: Adequacy of tissue perfusion will improve Outcome: Progressing   Problem: Education: Goal: Knowledge of the prescribed therapeutic regimen will improve Outcome: Progressing Goal: Ability to verbalize activity precautions or restrictions will improve Outcome: Progressing Goal: Understanding of discharge needs will improve Outcome: Progressing   Problem: Activity: Goal: Ability to perform//tolerate increased activity and mobilize with assistive devices will improve Outcome: Progressing   Problem: Clinical Measurements: Goal: Postoperative complications will be avoided or minimized Outcome: Progressing   Problem: Self-Care: Goal: Ability to meet self-care needs will improve Outcome: Progressing   Problem: Self-Concept: Goal: Ability to maintain and perform role responsibilities to the fullest extent possible will improve Outcome: Progressing   Problem: Pain Management: Goal: Pain level will decrease with appropriate interventions Outcome: Progressing   Problem: Skin Integrity: Goal: Demonstration of wound healing without infection will improve Outcome: Progressing

## 2023-02-15 NOTE — Discharge Summary (Addendum)
Deborah [name] UEA:[id number] DOB: 08/04/1965 DOA: 01/29/2023  PCP: Pcp, No  Admit date: 01/29/2023  Discharge date: 02/15/2023  Admitted From: Home   Disposition:  Home   Recommendations for Outpatient Follow-up:   Follow up with PCP in 1-2 weeks  PCP Please obtain BMP/CBC, 2 view CXR in 1week,  (see Discharge instructions)   PCP Please follow up on the following pending results:  Look at their antibiotic recommendations below and please arrange for refills as needed she needs to follow-up with Dr. Lajoyce Corners and ID office within 2 weeks of discharge.   Home Health: RN, PT, aide if she qualifies Equipment/Devices: As below Consultations: Allergy, ID, Dr. Lajoyce Corners orthopedics Discharge Condition: Stable    CODE STATUS: Full    Diet Recommendation:-Low carbohydrate diet, strict 1.5 L fluid restriction per day.  Check CBGs q. ACH S.  Chief Complaint  Patient presents with   Wound Check     Brief history of present illness from the day of admission and additional interim summary    57 y.o. female with history of ESRD on HD MWF, PAF-not on any anticoagulation due to recent GI bleeding, HFpEF, DM-2, HTN, chronic pain syndrome on narcotics, narcotic seeking behavior, noncompliant with tests and treatment,-who underwent BKA 10/23-presented with a right BKA stump dehiscence, left heel cellulitis, seen by Dr. Lajoyce Corners and ID. MRI thus far does not show any osteomyelitis of the left foot or ankle.                                                                  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 Lajoyce Corners 11/27.     Left  heel ulcer -with likely osteomyelitis and some surrounding acute cellulitis, present on admission, Dr. Lajoyce Corners following, on IV antibiotics, MRI of left ankle and foot no osteomyelitis, discussed with Dr. Lajoyce Corners, Dr. Lajoyce Corners continue supportive care and antibiotics per ID, note patient refused MRI testing for 3 different days and it was finally done after much deliberation and counseling.  Case discussed with Dr. Luciana Axe ID on 02/15/2023 antibiotics are as below, due to Augmentin allergy schedule has been changed.  Must follow-up with ID within a week for further antibiotic clarification.   Vanc + Ceftaz ( with HD) - Renal informed + oral Metronidazole x 2 weeks (EOT 12/17)  Then would start Doxy 100 mg bid + Flagyl 500 twice daily, Cefadroxil 500 p.o. twice daily mg she is seen by ID in the office and further antibiotic regimen is clarified.  She will also follow-up with Dr. Lajoyce Corners postdischarge.     Discussed with Dr. Lajoyce Corners and  Dr. Luciana Axe prior to discharge on 02/15/2023, dry dressing change in the stump and left heel daily, cam boot left foot while bearing weight, antibiotics as above post discharge PCP to ensure follow-up with ID and orthopedics within the the next 7 to 10 days.     Some narcotic seeking behavior with history of chronic pain.  Definitely exhibiting some narcotic seeking behavior, counseled multiple times against overuse, cautiously use narcotics for acute pain with some Narcan on board.     ESRD on HD-nephrology consulted, noncompliant, refusing to go to dialysis on 02/07/2023 again on 02/11/2023 - counseled.  Patient is repeatedly refusing tests and HD treatment despite counseling.  Remains highly manipulative and narcotic seeking.   Hypokalemia-replace as indicated, continue to monitor   Generalized body tremors /jerking movement-on 11/25, no recurrence since then.  Neurology briefly evaluated, appears to be functional   History of GI bleed October 2024-thought to be due to AVMs based on the  EGD.  No longer an issue, hemoglobin overall stable   History of [place]-supportive care, she would benefit from weight loss   PAF-not on anticoagulation due to recent GI bleed.  Continue metoprolol   Hypokalemia.  Dialysis patient, low-dose potassium supplementation.     Chronic diastolic CHF, EF 84% on recent echo-volume management per HD, compensated   Obesity, morbid-BMI 40.3.  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  She mentioned the same to Dr. Thedore Mins with nephrology and she is thinking about hospice, but for now wants to continue full medical treatment   She now claims on 02/12/2023 that she has become homeless since this admission and wants help with placement, however on 02/14/2023 she says that she has found a place to go where her husband will move back in and help her out.  Discharge diagnosis     Principal Problem:   Dehiscence of amputation stump of right lower extremity (HCC) Active Problems:   Right below-knee amputee [facility])    Discharge instructions    Discharge Instructions     Negative Pressure Wound Therapy - Incisional   Complete by: As directed    Attached the wound VAC dressing to a Prevena plus portable wound VAC pump at discharge.       Discharge Medications   Allergies as of 02/15/2023       Reactions   Isovue [iopamidol] Anaphylaxis, [name], Other (See Comments)   11/28/17 Patient had seizure like activity and then 1 min code after 100 cc of isovue 300. Possible contrast allergy vs vasovagal episode Cardiac Arrest   Neurontin [gabapentin] [name], Swelling   Nsaids Anaphylaxis, Other (See Comments)   Hx of stomach ulcers    Penicillins Itching, Palpitations, Other (See Comments)   Flushing (Red Skin) Laryngeal Edema   Reglan [metoclopramide] Other (See Comments)   Tardive dyskinesia   Valium [diazepam] [name]   Zestril [lisinopril] Anaphylaxis, Swelling   Tongue and mouth swelling Laryngeal Edema   Tolectin [tolmetin] Nausea And Vomiting, Nausea Only, Other (See Comments)   Irritates stomach ulcer   Asa [aspirin] Other (See Comments)   Hx of stomach ulcer   Aspartame And Phenylalanine Hives   Bentyl [dicyclomine] Other (See Comments)  Chest pain   Hibiclens [chlorhexidine Gluconate] Other (See Comments)   Dermatitis    Flexeril [cyclobenzaprine] Palpitations   Oxycontin [oxycodone] Palpitations   Rifamycins Palpitations   Tylenol [acetaminophen] Nausea And Vomiting, Nausea Only, Other (See Comments)   Irritates stomach ulcer Abdominal pain   Ultram [tramadol] Nausea And Vomiting, Palpitations        Medication List     STOP taking these medications    cefdinir 300 MG capsule Commonly known as: OMNICEF       TAKE these medications    Nutritional Supplement Liqd Take 120-237 mLs by mouth See admin instructions. House supplement. Give 120 mL in the morning at 0900 and 237 mL twice daily between meals (1000, 1400)   (feeding supplement) PROSource Plus liquid Take 30 mLs by mouth 2 (two) times daily between meals.   nutrition supplement (JUVEN) Pack Take 1 packet by mouth 2 (two) times daily between meals.   albuterol (2.5 MG/3ML) 0.083% nebulizer solution Commonly known as: PROVENTIL Take 3 mLs (2.5 mg total) by nebulization every 6 (six) hours as needed for wheezing or shortness of breath.   allopurinol 100 MG tablet Commonly known as: ZYLOPRIM Take 1 tablet (100 mg total) by mouth 2 (two) times daily.   ascorbic acid 500 MG tablet Commonly known as: VITAMIN C Take 500 mg by mouth daily.   atorvastatin 10 MG tablet Commonly known as: LIPITOR Take 10 mg by mouth  every evening.   busPIRone 5 MG tablet Commonly known as: BUSPAR Take 5 mg by mouth 3 (three) times daily.   cefadroxil 500 MG capsule Commonly known as: DURICEF Take 1 capsule (500 mg total) by mouth 2 (two) times daily. Start taking on: February 23, 2023   cefTAZidime 0.5 g in dextrose 5 % 50 mL Inject 0.5 g into the vein daily.   cetirizine 10 MG tablet Commonly known as: ZYRTEC Take 10 mg by mouth daily.   colchicine 0.6 MG tablet Take 0.5 tablets (0.3 mg total) by mouth 2 (two) times a week.   doxycycline 100 MG capsule Commonly known as: VIBRAMYCIN Take 1 capsule (100 mg total) by mouth 2 (two) times daily. Start taking on: February 23, 2023   famotidine 20 MG tablet Commonly known as: PEPCID Take 20 mg by mouth daily before breakfast.   feeding supplement (PRO-STAT SUGAR FREE 64) Liqd Take 30 mLs by mouth 2 (two) times daily between meals.   ferric citrate 1 GM 210 MG(Fe) tablet Commonly known as: AURYXIA Take 2 tablets (420 mg total) by mouth 3 (three) times daily with meals. What changed: how much to take   fluticasone 50 MCG/ACT nasal spray Commonly known as: FLONASE Place 2 sprays into both nostrils daily as needed for allergies or rhinitis.   folic acid 1 MG tablet Commonly known as: FOLVITE Take 1 tablet (1 mg total) by mouth daily.   gabapentin 100 MG capsule Commonly known as: NEURONTIN Take 1 capsule (100 mg total) by mouth 3 (three) times daily.   HYDROmorphone 4 MG tablet Commonly known as: Dilaudid Take 1 tablet (4 mg total) by mouth See admin instructions. Give 4 mg (1 tablet) three times daily for pain. Give first dose at 0600 on dialysis days (T-Th-Sat), otherwise give first dose at 0900. May give an additional tablet every 24 hours as needed for severe pain (do not administer within 3 hours of scheduled dose).   hydrOXYzine 25 MG tablet Commonly known as: ATARAX Take 1 tablet (25 mg total) by  mouth 3 (three) times daily as needed for  anxiety. What changed:  when to take this additional instructions   insulin lispro 100 UNIT/ML KwikPen Commonly known as: HUMALOG Before each meal 3 times a day, 140-199 - 2 units, 200-250 - 6 units, 251-299 - 8 units,  300-349 - 12 units,  350 or above 14 units. What changed:  how much to take how to take this when to take this additional instructions   loperamide 2 MG capsule Commonly known as: IMODIUM Take 1 capsule (2 mg total) by mouth as needed for diarrhea or loose stools. What changed: when to take this   methocarbamol 500 MG tablet Commonly known as: ROBAXIN Take 1 tablet (500 mg total) by mouth every 6 (six) hours as needed for muscle spasms.   metoprolol tartrate 50 MG tablet Commonly known as: LOPRESSOR Take 1 tablet (50 mg total) by mouth 2 (two) times daily.   metroNIDAZOLE 500 MG tablet Commonly known as: FLAGYL Take 1 tablet (500 mg total) by mouth every 12 (twelve) hours.   mirtazapine 15 MG tablet Commonly known as: REMERON Take 15 mg by mouth at bedtime.   pantoprazole 40 MG tablet Commonly known as: PROTONIX Take 40 mg by mouth 2 (two) times daily.   Semglee (yfgn) 100 UNIT/ML Pen Generic drug: insulin glargine Inject 8 Units into the skin at bedtime.   SENIOR MULTIVITAMIN PLUS PO Take 1 tablet by mouth daily.   vancomycin 1-5 GM/200ML-% Soln Commonly known as: VANCOCIN Inject 200 mLs (1,000 mg total) into the vein every Monday, Wednesday, and Friday with hemodialysis.   zinc sulfate (50mg  elemental zinc) 220 (50 Zn) MG capsule Take 1 capsule (220 mg total) by mouth daily.               Durable Medical Equipment  (From admission, onward)           Start     Ordered   02/15/23 0751  For home use only DME Walker rolling  Once       Comments: 5 wheel  Question Answer Comment  Walker: With 5 Inch Wheels   Patient needs a walker to treat with the following condition Weakness      02/15/23 0750   02/15/23 0751  For home use  only DME 3 n 1  Once        02/15/23 0750   02/14/23 1209  For home use only DME Hospital bed  Once       Comments: PATIENT NEEDS BARIATRIC DUE TO BODY HABITUS. PATIENT DOES NOT FILL WELL IN STANDARD BED OR HOYER LIFT  Question Answer Comment  Length of Need Lifetime   Patient has (list medical condition): dehiscence of amputation of stump   The above medical condition requires: Patient requires the ability to reposition frequently   Head must be elevated greater than: 45 degrees   Bed type Semi-electric   Hoyer Lift Yes   Support Surface: Gel Overlay      02/14/23 1211             Follow-up Information     Nadara Mustard, MD Follow up in 1 week(s).   Specialty: Orthopedic Surgery Contact information: [address] [place] [address] [phone]         Medicaid Transportation. Schedule an appointment as soon as possible for a visit.   Why: Contact two days in advance for transport to Dialysis. Contact information: [phone]        Center,  [facility] Follow up.   Why: Tuesday, Thursday, Saturday with 12:10 pm chair time.  For first appointment, please arrive at 11:30 am. Contact information: [address] [place] [address] [phone]         Gardiner Barefoot, MD. Schedule an appointment as soon as possible for a visit in 2 week(s).   Specialty: Infectious Diseases Contact information: [address] [place] [address] [phone]                 Major procedures and Radiology Reports - PLEASE review detailed and final reports thoroughly  -       MR ANKLE LEFT WO CONTRAST  Result Date: 02/11/2023 CLINICAL DATA:  Chronic ankle pain, heel wound EXAM: MRI OF THE LEFT ANKLE WITHOUT CONTRAST TECHNIQUE: Multiplanar, multisequence MR imaging of the ankle was performed. No intravenous contrast was administered. COMPARISON:  None Available. FINDINGS: Despite efforts by the technologist and patient, motion  artifact is present on today's exam and could not be eliminated. This reduces exam sensitivity and specificity. TENDONS Peroneal: Common peroneus tendon sheath tenosynovitis. Posteromedial: Distal tibialis posterior tendinopathy. Suspected small accessory navicular although less appreciable than I would expect on the T1 weighted sequence. Mild flexor hallucis longus tenosynovitis just proximal to the knot of Henry. Anterior: Unremarkable Achilles: Unremarkable Plantar Fascia: Abnormal thickening of the medial band compatible with plantar fasciitis. LIGAMENTS Lateral: Grossly intact, obscured by motion artifact. Medial: Obscured by motion artifact, integrity of the deltoid ligament is difficult to assess. CARTILAGE Ankle Joint: Grossly unremarkable. Subtalar Joints/Sinus Tarsi: Moderate degenerative spurring. Bones: Substantial degenerative spurring at the Chopart joint and in the midfoot. No findings of osteomyelitis. Other: Subcutaneous edema anterolaterally along the ankle and tracking into the dorsum of the foot. IMPRESSION: 1. No findings of osteomyelitis. 2. Common peroneus tendon sheath tenosynovitis. 3. Distal tibialis posterior tendinopathy. 4. Mild flexor hallucis longus tenosynovitis. 5. Plantar fasciitis. 6. Subcutaneous edema anterolaterally along the ankle and tracking into the dorsum of the foot. 7. Degenerative spurring at the Chopart joint and in the midfoot. Electronically Signed   By: Gaylyn Rong M.D.   On: 02/11/2023 13:56   MR FOOT LEFT WO CONTRAST  Result Date: 02/10/2023 CLINICAL DATA:  Chronic foot pain EXAM: MRI OF THE LEFT FOOT WITHOUT CONTRAST TECHNIQUE: Multiplanar, multisequence MR imaging of the left forefoot was performed. No intravenous contrast was administered. COMPARISON:  02/07/2023 FINDINGS: Despite efforts by the technologist and patient, motion artifact is present on today's exam and could not be eliminated. This reduces exam sensitivity and specificity.  Bones/Joint/Cartilage No marrow edema in the forefoot to suggest osteomyelitis. Spurring of the first metatarsal head noted with a 4 mm degenerative subcortical cystic lesion along the central articular surface of the first metatarsal head on image 9 series 6. First digit sesamoids unremarkable. No definite erosion observed. Mild degenerative findings including loss of articular space and spurring at the Lisfranc joint. Ligaments The Lisfranc ligament appears intact. No definite turf toe or plantar plate injury identified. Muscles and Tendons Moderate regional muscular atrophy. Soft tissues Subcutaneous edema along the dorsum of the foot and extending into the toes. There is lateral subcutaneous edema at the level of the fifth MTP joint as well as subcutaneous edema along the ball of the foot also extending into the toes. IMPRESSION: 1. No marrow edema in the forefoot to suggest osteomyelitis. 2. Subcutaneous edema along the dorsum of the foot and extending into the toes, and also along the ball of the  foot. 3. Moderate regional muscular atrophy. 4. Mild degenerative findings at the Lisfranc joint and first metatarsophalangeal joint. Electronically Signed   By: Gaylyn Rong M.D.   On: 02/10/2023 18:59   DG Chest Port 1 View  Result Date: 01/31/2023 CLINICAL DATA:  [name]. EXAM: PORTABLE CHEST 1 VIEW COMPARISON:  November 23, 2022. FINDINGS: Stable cardiomediastinal silhouette. Right internal jugular dialysis catheter is unchanged. Both lungs are clear. The visualized skeletal structures are unremarkable. IMPRESSION: No active disease. Electronically Signed   By: Lupita Raider M.D.   On: 01/31/2023 16:46   CT ABDOMEN PELVIS WO CONTRAST  Result Date: 01/27/2023 CLINICAL DATA:  Acute generalized abdominal pain. EXAM: CT ABDOMEN AND PELVIS WITHOUT CONTRAST TECHNIQUE: Multidetector CT imaging of the abdomen and pelvis was performed following the standard protocol without IV contrast.  RADIATION DOSE REDUCTION: This exam was performed according to the departmental dose-optimization program which includes automated exposure control, adjustment of the mA and/or kV according to patient size and/or use of iterative reconstruction technique. COMPARISON:  December 14, 2022. FINDINGS: Lower chest: No acute abnormality. Hepatobiliary: No focal liver abnormality is seen. Status post cholecystectomy. No biliary dilatation. Pancreas: Unremarkable. No pancreatic ductal dilatation or surrounding inflammatory changes. Spleen: Normal in size without focal abnormality. Adrenals/Urinary Tract: Adrenal glands are unremarkable. Kidneys are normal, without renal calculi, focal lesion, or hydronephrosis. Bladder is unremarkable. Stomach/Bowel: Stomach is within normal limits. Appendix appears normal. No evidence of bowel wall thickening, distention, or inflammatory changes. Vascular/Lymphatic: No significant vascular findings are present. No enlarged abdominal or pelvic lymph nodes. Reproductive: Stable uterine fibroid is noted. No definite adnexal abnormality is noted. Other: No abdominal wall hernia or abnormality. No abdominopelvic ascites. Musculoskeletal: No acute or significant osseous findings. IMPRESSION: No acute abnormality seen in the abdomen or pelvis. Electronically Signed   By: Lupita Raider M.D.   On: 01/27/2023 15:48   DG Foot 2 Views Left  Result Date: 01/21/2023 CLINICAL DATA:  Left foot wound EXAM: LEFT FOOT - 2 VIEW COMPARISON:  MRI from 12/19/2022 FINDINGS: Considerable soft tissue swelling of the foot is noted increased when compared with prior exam. Tarsal degenerative changes are seen. The known soft tissue wound posteriorly is not as well appreciated as on clinical exam. No definitive erosive changes are identified to suggest osteomyelitis. IMPRESSION: Generalized soft tissue swelling. No bony changes to suggest osteomyelitis are noted at this time. Electronically Signed   By: Alcide Clever M.D.   On: 01/21/2023 22:28    Micro Results     Recent Results (from the past 240 hour(s))  MRSA Next Gen by PCR, Nasal     Status: None   Collection Time: 02/06/23  6:05 AM   Specimen: Nasal Mucosa; Nasal Swab  Result Value Ref Range Status   MRSA by PCR Next Gen NOT DETECTED NOT DETECTED Final    Comment: (NOTE) The GeneXpert MRSA Assay (FDA approved for NASAL specimens only), is one component of a comprehensive MRSA colonization surveillance program. It is not intended to diagnose MRSA infection nor to guide or monitor treatment for MRSA infections. Test performance is not FDA approved in patients less than 31 years old. Performed at [facility], [address] [place], [place] [address]     Today   Subjective    Lonny Widdowson today has no headache,no chest abdominal pain,no new weakness tingling or numbness, feels much better wants to go home today.     Objective   Blood pressure  114/67, pulse 100, temperature 97.9 F (36.6 C), temperature source Oral, resp. rate 18, height 5\' 6"  (1.676 m), weight 125.1 kg, last menstrual period 10/10/2012, SpO2 100%.   Intake/Output Summary (Last 24 hours) at 02/15/2023 0807 Last data filed at 02/15/2023 0200 Gross per 24 hour  Intake --  Output 2000 ml  Net -2000 ml    Exam  Awake Alert, No new F.N deficits,    [place].AT,PERRAL Supple Neck,   Symmetrical Chest wall movement, Good air movement bilaterally, CTAB RRR,No Gallops,   +ve B.Sounds, Abd Soft, Non tender,  Right BKA stump under bandage with wound VAC, left ankle under bandage, right IJ hemodialysis catheter    Data Review   Recent Labs  Lab 02/09/23 0422 02/10/23 0225 02/11/23 1811 02/12/23 0429 02/14/23 0532  WBC 15.1* 14.8* 15.7* 15.2* 16.1*  HGB 7.8* 7.7* 8.4* 8.0* 7.9*  HCT 24.9* 25.9* 27.4* 26.3* 25.8*  PLT 332 353 339 322 353  MCV 93.3 94.5 94.2 94.9 94.9  MCH 29.2 28.1 28.9 28.9 29.0  MCHC 31.3 29.7* 30.7 30.4 30.6  RDW 15.2  15.7* 15.9* 15.9* 16.1*  LYMPHSABS 3.0 2.3 3.2 3.6 3.4  MONOABS 1.2* 1.0 0.9 1.1* 0.9  EOSABS 0.5 0.5 0.7* 0.7* 0.7*  BASOSABS 0.1 0.1 0.1 0.1 0.1    Recent Labs  Lab 02/09/23 0422 02/10/23 0225 02/10/23 0747 02/11/23 1811 02/14/23 0532  NA  --  142  --   --  142  K  --  3.0*  --   --  3.4*  CL  --  109  --   --  109  CO2  --  20*  --   --  21*  ANIONGAP  --  13  --   --  12  GLUCOSE  --  203*  --   --  126*  BUN  --  37*  --   --  40*  CREATININE  --  4.88*  --   --  5.50*  ALBUMIN  --  1.7*  --   --  1.8*  CRP 8.2*  --  8.0* 5.9*  --   CALCIUM  --  7.8*  --   --  7.9*    Total Time in preparing paper work, data evaluation and todays exam - 35 minutes  Signature  -    Susa Raring M.D on 02/15/2023 at 8:07 AM   -  To page go to [email or web address]

## 2023-02-15 NOTE — Progress Notes (Signed)
Asked pt about how she plans to get home. Pt stated she'll need ambulance transport. Pt then stated she trying to get in contact with her husband because she doesn't have a key to get into the house and she isn't sure if he's home or not. Pt's husband number is not listed on the chart. Pt stated she'll call her husband. Case manager made aware.

## 2023-02-15 NOTE — Progress Notes (Signed)
Late entry: Patient left unit via bed for transport to dialysis

## 2023-02-15 NOTE — Progress Notes (Signed)
Orthopedic Tech Progress Note Patient Details:  Deborah [name] 1965-12-02 [id number]  Applied CAM walker. Ortho Devices Type of Ortho Device: CAM walker Ortho Device/Splint Location: LUE Ortho Device/Splint Interventions: Ordered, Application, Adjustment   Post Interventions Patient Tolerated: Well Instructions Provided: Care of device, Adjustment of device  Sherilyn Banker 02/15/2023, 10:59 AM

## 2023-02-16 ENCOUNTER — Encounter: Payer: 59 | Attending: Registered Nurse | Admitting: Registered Nurse

## 2023-02-16 NOTE — Progress Notes (Signed)
[facility] [phone] [facility] Liaison Note:   Notified by [facility] manager of patient request for [facility] palliative services at home after discharge.    Please call with any hospice our outpatient palliative care related questions.   Thank you for the opportunity to participate in this patient's care.   Henderson Newcomer, LPN [facility] Liaison [phone]

## 2023-02-16 NOTE — Progress Notes (Signed)
Late Note Entry- Feb 16, 2023  Pt was d/c to home yesterday. Contacted [facility] this morning to advise clinic of pt's d/c date and that pt should resume care tomorrow.   Olivia Canter Renal Navigator [phone]

## 2023-02-17 ENCOUNTER — Observation Stay (HOSPITAL_COMMUNITY)
Admission: EM | Admit: 2023-02-17 | Discharge: 2023-02-18 | Disposition: A | Discharge status: Home Health | Payer: 59 | Attending: Internal Medicine | Admitting: Internal Medicine

## 2023-02-17 ENCOUNTER — Other Ambulatory Visit: Payer: Self-pay

## 2023-02-17 ENCOUNTER — Encounter (HOSPITAL_COMMUNITY): Payer: Self-pay

## 2023-02-17 ENCOUNTER — Emergency Department (HOSPITAL_COMMUNITY): Payer: 59

## 2023-02-17 DIAGNOSIS — N186 End stage renal disease: Secondary | ICD-10-CM | POA: Diagnosis not present

## 2023-02-17 DIAGNOSIS — E1122 Type 2 diabetes mellitus with diabetic chronic kidney disease: Secondary | ICD-10-CM | POA: Diagnosis not present

## 2023-02-17 DIAGNOSIS — T8789 Other complications of amputation stump: Secondary | ICD-10-CM | POA: Diagnosis not present

## 2023-02-17 DIAGNOSIS — R1030 Lower abdominal pain, unspecified: Secondary | ICD-10-CM

## 2023-02-17 DIAGNOSIS — D649 Anemia, unspecified: Secondary | ICD-10-CM | POA: Diagnosis not present

## 2023-02-17 DIAGNOSIS — R109 Unspecified abdominal pain: Secondary | ICD-10-CM | POA: Diagnosis present

## 2023-02-17 DIAGNOSIS — I480 Paroxysmal atrial fibrillation: Secondary | ICD-10-CM | POA: Diagnosis not present

## 2023-02-17 DIAGNOSIS — M79604 Pain in right leg: Secondary | ICD-10-CM | POA: Insufficient documentation

## 2023-02-17 DIAGNOSIS — R1013 Epigastric pain: Principal | ICD-10-CM | POA: Insufficient documentation

## 2023-02-17 DIAGNOSIS — F32A Depression, unspecified: Secondary | ICD-10-CM | POA: Diagnosis present

## 2023-02-17 DIAGNOSIS — I5030 Unspecified diastolic (congestive) heart failure: Secondary | ICD-10-CM | POA: Diagnosis present

## 2023-02-17 DIAGNOSIS — F419 Anxiety disorder, unspecified: Secondary | ICD-10-CM | POA: Diagnosis not present

## 2023-02-17 DIAGNOSIS — R000 Tachycardia, unspecified: Secondary | ICD-10-CM

## 2023-02-17 DIAGNOSIS — Z89511 Acquired absence of right leg below knee: Secondary | ICD-10-CM | POA: Insufficient documentation

## 2023-02-17 DIAGNOSIS — I5042 Chronic combined systolic (congestive) and diastolic (congestive) heart failure: Secondary | ICD-10-CM | POA: Insufficient documentation

## 2023-02-17 DIAGNOSIS — Z6841 Body Mass Index (BMI) 40.0 and over, adult: Secondary | ICD-10-CM | POA: Insufficient documentation

## 2023-02-17 DIAGNOSIS — L03116 Cellulitis of left lower limb: Secondary | ICD-10-CM | POA: Diagnosis not present

## 2023-02-17 DIAGNOSIS — D72829 Elevated white blood cell count, unspecified: Secondary | ICD-10-CM | POA: Diagnosis not present

## 2023-02-17 DIAGNOSIS — I132 Hypertensive heart and chronic kidney disease with heart failure and with stage 5 chronic kidney disease, or end stage renal disease: Secondary | ICD-10-CM | POA: Diagnosis not present

## 2023-02-17 DIAGNOSIS — Z992 Dependence on renal dialysis: Secondary | ICD-10-CM | POA: Insufficient documentation

## 2023-02-17 DIAGNOSIS — Z8673 Personal history of transient ischemic attack (TIA), and cerebral infarction without residual deficits: Secondary | ICD-10-CM | POA: Diagnosis not present

## 2023-02-17 DIAGNOSIS — R6510 Systemic inflammatory response syndrome (SIRS) of non-infectious origin without acute organ dysfunction: Secondary | ICD-10-CM

## 2023-02-17 DIAGNOSIS — I5032 Chronic diastolic (congestive) heart failure: Secondary | ICD-10-CM | POA: Diagnosis not present

## 2023-02-17 DIAGNOSIS — M79609 Pain in unspecified limb: Secondary | ICD-10-CM

## 2023-02-17 DIAGNOSIS — R52 Pain, unspecified: Secondary | ICD-10-CM | POA: Insufficient documentation

## 2023-02-17 LAB — COMPREHENSIVE METABOLIC PANEL
ALT: 8 U/L (ref 0–44)
AST: 15 U/L (ref 15–41)
Albumin: 2.4 g/dL — ABNORMAL LOW (ref 3.5–5.0)
Alkaline Phosphatase: 59 U/L (ref 38–126)
Anion gap: 15 (ref 5–15)
BUN: 31 mg/dL — ABNORMAL HIGH (ref 6–20)
CO2: 21 mmol/L — ABNORMAL LOW (ref 22–32)
Calcium: 8.6 mg/dL — ABNORMAL LOW (ref 8.9–10.3)
Chloride: 107 mmol/L (ref 98–111)
Creatinine, Ser: 5.83 mg/dL — ABNORMAL HIGH (ref 0.44–1.00)
GFR, Estimated: 8 mL/min — ABNORMAL LOW (ref >60)
Glucose, Bld: 164 mg/dL — ABNORMAL HIGH (ref 70–99)
Potassium: 4 mmol/L (ref 3.5–5.1)
Sodium: 143 mmol/L (ref 135–145)
Total Bilirubin: 0.3 mg/dL (ref <1.2)
Total Protein: 6.9 g/dL (ref 6.5–8.1)

## 2023-02-17 LAB — CBC
HCT: 39.8 % (ref 36.0–46.0)
Hemoglobin: 11.7 g/dL — ABNORMAL LOW (ref 12.0–15.0)
MCH: 29.7 pg (ref 26.0–34.0)
MCHC: 29.4 g/dL — ABNORMAL LOW (ref 30.0–36.0)
MCV: 101 fL — ABNORMAL HIGH (ref 80.0–100.0)
Platelets: 356 10*3/uL (ref 150–400)
RBC: 3.94 MIL/uL (ref 3.87–5.11)
RDW: 17.2 % — ABNORMAL HIGH (ref 11.5–15.5)
WBC: 12.9 10*3/uL — ABNORMAL HIGH (ref 4.0–10.5)
nRBC: 0 % (ref 0.0–0.2)

## 2023-02-17 LAB — VANCOMYCIN, RANDOM: Vancomycin Rm: 11 ug/mL

## 2023-02-17 LAB — LIPASE, BLOOD: Lipase: 26 U/L (ref 11–51)

## 2023-02-17 MED ORDER — ATORVASTATIN CALCIUM 10 MG PO TABS
10.0000 mg | ORAL_TABLET | Freq: Every evening | ORAL | Status: DC
  Administered 2023-02-17: 10 mg via ORAL
  Filled 2023-02-17: qty 1

## 2023-02-17 MED ORDER — SODIUM CHLORIDE 0.9 % IV SOLN
1.0000 g | INTRAVENOUS | Status: DC
  Filled 2023-02-17: qty 1

## 2023-02-17 MED ORDER — SODIUM CHLORIDE 0.9 % IV BOLUS
500.0000 mL | Freq: Once | INTRAVENOUS | Status: AC
  Administered 2023-02-17: 500 mL via INTRAVENOUS

## 2023-02-17 MED ORDER — HYDROMORPHONE HCL 1 MG/ML IJ SOLN
1.0000 mg | Freq: Once | INTRAMUSCULAR | Status: AC
Start: 2023-02-17 — End: 2023-02-17
  Administered 2023-02-17: 1 mg via INTRAVENOUS
  Filled 2023-02-17: qty 1

## 2023-02-17 MED ORDER — BUSPIRONE HCL 5 MG PO TABS
5.0000 mg | ORAL_TABLET | Freq: Three times a day (TID) | ORAL | Status: DC
Start: 2023-02-17 — End: 2023-02-19
  Administered 2023-02-17 – 2023-02-18 (×2): 5 mg via ORAL
  Filled 2023-02-17 (×2): qty 1

## 2023-02-17 MED ORDER — DIPHENHYDRAMINE HCL 50 MG/ML IJ SOLN
12.5000 mg | Freq: Once | INTRAMUSCULAR | Status: AC
  Administered 2023-02-17: 12.5 mg via INTRAVENOUS
  Filled 2023-02-17: qty 1

## 2023-02-17 MED ORDER — FOLIC ACID 1 MG PO TABS
1.0000 mg | ORAL_TABLET | Freq: Every day | ORAL | Status: DC
  Administered 2023-02-17 – 2023-02-18 (×2): 1 mg via ORAL
  Filled 2023-02-17 (×2): qty 1

## 2023-02-17 MED ORDER — METOPROLOL TARTRATE 50 MG PO TABS
50.0000 mg | ORAL_TABLET | Freq: Two times a day (BID) | ORAL | Status: DC
  Administered 2023-02-17 – 2023-02-18 (×2): 50 mg via ORAL
  Filled 2023-02-17 (×2): qty 1

## 2023-02-17 MED ORDER — LORATADINE 10 MG PO TABS
10.0000 mg | ORAL_TABLET | Freq: Every day | ORAL | Status: DC
  Administered 2023-02-17 – 2023-02-18 (×2): 10 mg via ORAL
  Filled 2023-02-17 (×2): qty 1

## 2023-02-17 MED ORDER — MIRTAZAPINE 15 MG PO TABS
15.0000 mg | ORAL_TABLET | Freq: Every day | ORAL | Status: DC
Start: 2023-02-17 — End: 2023-02-19
  Administered 2023-02-17: 15 mg via ORAL
  Filled 2023-02-17: qty 1

## 2023-02-17 MED ORDER — VANCOMYCIN HCL IN DEXTROSE 1-5 GM/200ML-% IV SOLN: 1000.0000 mg | Freq: Once | INTRAVENOUS | Status: DC

## 2023-02-17 MED ORDER — GERHARDT'S BUTT CREAM
TOPICAL_CREAM | Freq: Every day | CUTANEOUS | Status: DC
  Filled 2023-02-17: qty 60

## 2023-02-17 MED ORDER — HYDROMORPHONE HCL 1 MG/ML IJ SOLN
0.5000 mg | INTRAMUSCULAR | Status: DC | PRN
  Administered 2023-02-17 – 2023-02-18 (×2): 0.5 mg via INTRAVENOUS
  Filled 2023-02-17 (×2): qty 0.5

## 2023-02-17 MED ORDER — SODIUM CHLORIDE 0.9 % IV SOLN: INTRAVENOUS | Status: AC

## 2023-02-17 MED ORDER — VITAMIN C 500 MG PO TABS
500.0000 mg | ORAL_TABLET | Freq: Every day | ORAL | Status: DC
  Administered 2023-02-17 – 2023-02-18 (×2): 500 mg via ORAL
  Filled 2023-02-17 (×2): qty 1

## 2023-02-17 MED ORDER — METRONIDAZOLE 500 MG PO TABS
500.0000 mg | ORAL_TABLET | Freq: Two times a day (BID) | ORAL | Status: DC
  Administered 2023-02-17 – 2023-02-18 (×2): 500 mg via ORAL
  Filled 2023-02-17 (×2): qty 1

## 2023-02-17 MED ORDER — VANCOMYCIN HCL 750 MG/150ML IV SOLN
750.0000 mg | Freq: Once | INTRAVENOUS | Status: AC
  Administered 2023-02-17: 750 mg via INTRAVENOUS
  Filled 2023-02-17: qty 150

## 2023-02-17 MED ORDER — SODIUM CHLORIDE 0.9 % IV SOLN
1.0000 g | Freq: Once | INTRAVENOUS | Status: AC
  Administered 2023-02-17: 1 g via INTRAVENOUS
  Filled 2023-02-17: qty 1

## 2023-02-17 MED ORDER — FERRIC CITRATE 1 GM 210 MG(FE) PO TABS
210.0000 mg | ORAL_TABLET | Freq: Three times a day (TID) | ORAL | Status: DC
  Administered 2023-02-18: 210 mg via ORAL
  Filled 2023-02-17: qty 1

## 2023-02-17 MED ORDER — ONDANSETRON HCL 4 MG/2ML IJ SOLN
4.0000 mg | Freq: Once | INTRAMUSCULAR | Status: AC
  Administered 2023-02-17: 4 mg via INTRAVENOUS
  Filled 2023-02-17: qty 2

## 2023-02-17 MED ORDER — HYDROMORPHONE HCL 1 MG/ML IJ SOLN
1.0000 mg | Freq: Once | INTRAMUSCULAR | Status: AC
  Administered 2023-02-17: 1 mg via INTRAVENOUS
  Filled 2023-02-17: qty 1

## 2023-02-17 MED ORDER — FAMOTIDINE 20 MG PO TABS
20.0000 mg | ORAL_TABLET | Freq: Every day | ORAL | Status: DC
  Administered 2023-02-18: 20 mg via ORAL
  Filled 2023-02-17: qty 1

## 2023-02-17 MED ORDER — CEFTAZIDIME 1 G IJ SOLR: 1.0000 g | Freq: Once | INTRAMUSCULAR | Status: DC

## 2023-02-17 MED ORDER — ALLOPURINOL 100 MG PO TABS
100.0000 mg | ORAL_TABLET | Freq: Two times a day (BID) | ORAL | Status: DC
  Administered 2023-02-17 – 2023-02-18 (×2): 100 mg via ORAL
  Filled 2023-02-17 (×2): qty 1

## 2023-02-17 MED ORDER — ONDANSETRON HCL 4 MG/2ML IJ SOLN: 4.0000 mg | Freq: Four times a day (QID) | INTRAMUSCULAR | Status: DC | PRN

## 2023-02-17 MED ORDER — PANTOPRAZOLE SODIUM 40 MG IV SOLR
40.0000 mg | Freq: Once | INTRAVENOUS | Status: AC
  Administered 2023-02-17: 40 mg via INTRAVENOUS
  Filled 2023-02-17: qty 10

## 2023-02-17 MED ORDER — ENOXAPARIN SODIUM 40 MG/0.4ML IJ SOSY
40.0000 mg | PREFILLED_SYRINGE | INTRAMUSCULAR | Status: DC
  Administered 2023-02-17: 40 mg via SUBCUTANEOUS
  Filled 2023-02-17: qty 0.4

## 2023-02-17 MED ORDER — HYDROXYZINE HCL 25 MG PO TABS
25.0000 mg | ORAL_TABLET | Freq: Three times a day (TID) | ORAL | Status: DC | PRN
  Administered 2023-02-17 – 2023-02-18 (×2): 25 mg via ORAL
  Filled 2023-02-17 (×2): qty 1

## 2023-02-17 NOTE — H&P (Signed)
History and Physical    Patient: Deborah [name] ION:[id number] DOB: 08/10/1965 DOA: 02/17/2023 DOS: the patient was seen and examined on 02/17/2023 PCP: Pcp, No  Patient coming from: Home  Chief Complaint:  Chief Complaint  Patient presents with   Abdominal Pain   HPI: Deborah [name] is a 57 y.o. female with medical history significant of ESRD on HD MWF, PAF-not on any anticoagulation due to recent GI bleeding, HFpEF, DM-2, HTN, chronic pain syndrome on narcotics, narcotic seeking behavior, noncompliant with tests and treatment, Rt BKA on 10/23 with subsequent stump dehiscence, left heel cellulitis hospitalized 01/30/23-02/15/2023 who presents with worsening stump pain and mid abdominal pain.   Pt just hospitalized 01/30/23-02/15/2023 with sepsis due to right BKA stump infection with wound dehiscence and underwent revision with orthopedic Dr. Lajoyce Corners on 11/27. Also had left heel ulcer cellulitis followed by Dr. Lajoyce Corners and case discussed with Dr. Luciana Axe ID with extensive antibiotic schedule set out.Hospital course was complicated by pt non-compliant with MRI imaging, dialysis and also exhibiting narcotic seeking behavior. She was also had a challenging disposition as she reported initially of being homeless just prior to discharge.   Patient reports that today she was awaiting transport to go to dialysis but then had sudden acute onset mid abdominal and shooting pain of her right stump.  States she has gastro paresis.  Pain is mid abdominal with some nausea but no vomiting, diarrhea or constipation.  He just had a bowel movement this morning.  She still has urine output with her ESRD but denies any dysuria.  Also reports worsening shooting pain at up her right stump and some slight bleeding from her stump wound. States she was not discharged with " no pain pills, no antibiotics, no nothing."   Today on arrival to ED, she was afebrile but tachycardic with heart rate up to 130, BP of 145/118  on room air.  CBC with leukocytosis 12.9 which is actually improved from her chronic leukocytosis with baseline around 15-16.  Hemoglobin is hemoconcentrated at 11.7 with hematocrit of 39 from baseline of 8.  BMP with her baseline elevated creatinine of 5.83 but otherwise unremarkable from prior. Lipase and LFTs within normal limits.  CT abdomen and pelvis without contrast with no acute findings and shows stable pedunculated fibroid and left adnexal region.  CT right knee without contrast with postsurgical changes of BKA and simple organized fluid collection over laying superficial fascia likely seroma.  No CT evidence of osteomyelitis.  She was given IV vancomycin and ceftazidime as well as multiple doses of IV Dilaudid.  Hospitalist consulted for management of persistent tachycardia and pain.  Review of Systems: As mentioned in the history of present illness. All other systems reviewed and are negative. Past Medical History:  Diagnosis Date   Acute back pain with sciatica, left    Acute back pain with sciatica, right    AKI (acute kidney injury) (HCC)    Anemia, unspecified    Cancer (HCC)    Carcinoid tumor of duodenum    Chest pain with normal coronary angiography 2019   Chronic kidney disease, stage 3b (HCC)    Chronic pain    Chronic systolic CHF (congestive heart failure) (HCC)    Diabetes mellitus    DKA (diabetic ketoacidosis) (HCC)    Drug-seeking behavior    21 hospitalizations and 14 CT a/p in 2 years for N/V and abdominal pain, demanding only IV dilaudid   Elevated troponin    chronic   Esophageal  reflux    Fibromyalgia    Gastric ulcer    Gastroparesis    Gout    Hyperlipidemia    Hypertension    Hypokalemia    Hypomagnesemia    Lumbosacral stenosis    LVH (left ventricular hypertrophy)    Morbid obesity (HCC)    NICM (nonischemic cardiomyopathy) (HCC)    PAF (paroxysmal atrial fibrillation) (HCC)    Stroke (HCC) 02/2011   Thrombocytosis    Vitamin B12  deficiency anemia    Past Surgical History:  Procedure Laterality Date   ABDOMINAL AORTOGRAM W/LOWER EXTREMITY N/A 11/29/2022   Procedure: ABDOMINAL AORTOGRAM W/LOWER EXTREMITY;  Surgeon: Daria Pastures, MD;  Location: [facility];  Service: Cardiovascular;  Laterality: N/A;   AMPUTATION Right 12/29/2022   Procedure: RIGHT BELOW KNEE AMPUTATION;  Surgeon: Nadara Mustard, MD;  Location: [facility] OR;  Service: Orthopedics;  Laterality: Right;   AV FISTULA PLACEMENT Left 06/30/2022   Procedure: LEFT BRACHIOCEPHALIC ARTERIOVENOUS (AV) FISTULA CREATION;  Surgeon: Cephus Shelling, MD;  Location: [facility] OR;  Service: Vascular;  Laterality: Left;   BIOPSY  07/27/2019   Procedure: BIOPSY;  Surgeon: Vida Rigger, MD;  Location: [facility];  Service: Endoscopy;;   BIOPSY  07/30/2019   Procedure: BIOPSY;  Surgeon: Kathi Der, MD;  Location: [facility];  Service: Gastroenterology;;   CATARACT EXTRACTION  01/2014   CHOLECYSTECTOMY     COLONOSCOPY WITH PROPOFOL N/A 07/30/2019   Procedure: COLONOSCOPY WITH PROPOFOL;  Surgeon: Kathi Der, MD;  Location: [facility];  Service: Gastroenterology;  Laterality: N/A;   ESOPHAGOGASTRODUODENOSCOPY N/A 07/27/2019   Procedure: ESOPHAGOGASTRODUODENOSCOPY (EGD);  Surgeon: Vida Rigger, MD;  Location: [facility];  Service: Endoscopy;  Laterality: N/A;   ESOPHAGOGASTRODUODENOSCOPY N/A 07/26/2020   Procedure: ESOPHAGOGASTRODUODENOSCOPY (EGD);  Surgeon: Willis Modena, MD;  Location: [facility];  Service: Endoscopy;  Laterality: N/A;   ESOPHAGOGASTRODUODENOSCOPY (EGD) WITH PROPOFOL N/A 08/02/2019   Procedure: ESOPHAGOGASTRODUODENOSCOPY (EGD) WITH PROPOFOL;  Surgeon: Kathi Der, MD;  Location: [facility];  Service: Gastroenterology;  Laterality: N/A;   ESOPHAGOGASTRODUODENOSCOPY (EGD) WITH PROPOFOL N/A 12/23/2022   Procedure: ESOPHAGOGASTRODUODENOSCOPY (EGD) WITH PROPOFOL;  Surgeon: Shellia Cleverly, DO;  Location: [facility];  Service:  Gastroenterology;  Laterality: N/A;   FISTULA SUPERFICIALIZATION Left 12/31/2022   Procedure: LEFT ARM FISTULA TRANSPOSITION;  Surgeon: Maeola Harman, MD;  Location: [facility] OR;  Service: Vascular;  Laterality: Left;   GIVENS CAPSULE STUDY N/A 12/23/2022   Procedure: GIVENS CAPSULE STUDY;  Surgeon: Shellia Cleverly, DO;  Location: [facility];  Service: Gastroenterology;  Laterality: N/A;   HEMOSTASIS CLIP PLACEMENT  08/02/2019   Procedure: HEMOSTASIS CLIP PLACEMENT;  Surgeon: Kathi Der, MD;  Location: [facility];  Service: Gastroenterology;;   HOT HEMOSTASIS N/A 12/23/2022   Procedure: HOT HEMOSTASIS (ARGON PLASMA COAGULATION/BICAP);  Surgeon: Shellia Cleverly, DO;  Location: [facility] ENDOSCOPY;  Service: Gastroenterology;  Laterality: N/A;   IR FLUORO GUIDE CV LINE RIGHT  06/24/2022   IR US GUIDE VASC ACCESS RIGHT  06/24/2022   POLYPECTOMY  07/30/2019   Procedure: POLYPECTOMY;  Surgeon: Kathi Der, MD;  Location: [facility];  Service: Gastroenterology;;   POLYPECTOMY  08/02/2019   Procedure: POLYPECTOMY;  Surgeon: Kathi Der, MD;  Location: [facility];  Service: Gastroenterology;;   STUMP REVISION Right 02/02/2023   Procedure: REVISION RIGHT BELOW KNEE AMPUTATION;  Surgeon: Nadara Mustard, MD;  Location: [facility] OR;  Service: Orthopedics;  Laterality: Right;   Social History:  reports that she has never smoked. She has never used  smokeless tobacco. She reports that she does not drink alcohol and does not use drugs.  Allergies  Allergen Reactions   Isovue [Iopamidol] Anaphylaxis, [name] and Other (See Comments)    11/28/17 Patient had seizure like activity and then 1 min code after 100 cc of isovue 300. Possible contrast allergy vs vasovagal episode  Cardiac Arrest   Neurontin [Gabapentin] [name] and Swelling   Nsaids Anaphylaxis and Other (See Comments)    Hx of stomach ulcers   Penicillins Itching, Palpitations and Other (See Comments)     Flushing (Red Skin) Laryngeal Edema   Reglan [Metoclopramide] Other (See Comments)    Tardive dyskinesia    Valium [Diazepam] [name]   Zestril [Lisinopril] Anaphylaxis and Swelling    Tongue and mouth swelling Laryngeal Edema   Tolectin [Tolmetin] Nausea And Vomiting, Nausea Only and Other (See Comments)    Irritates stomach ulcer   Asa [Aspirin] Other (See Comments)    Hx of stomach ulcer   Aspartame And Phenylalanine Hives   Bentyl [Dicyclomine] Other (See Comments)    Chest pain   Hibiclens [Chlorhexidine Gluconate] Other (See Comments)    Dermatitis    Flexeril [Cyclobenzaprine] Palpitations   Oxycontin [Oxycodone] Palpitations   Rifamycins Palpitations   Tylenol [Acetaminophen] Nausea And Vomiting, Nausea Only and Other (See Comments)    Irritates stomach ulcer Abdominal pain   Ultram [Tramadol] Nausea And Vomiting and Palpitations    Family History  Problem Relation Age of Onset   Diabetes Mother    Diabetes Father    Heart disease Father    Diabetes Sister    Congestive Heart Failure Sister 50   Diabetes Brother     Prior to Admission medications   Medication Sig Start Date End Date Taking? Authorizing Provider  albuterol (PROVENTIL) (2.5 MG/3ML) 0.083% nebulizer solution Take 3 mLs (2.5 mg total) by nebulization every 6 (six) hours as needed for wheezing or shortness of breath. 04/06/19  Yes Bing Neighbors, NP  allopurinol (ZYLOPRIM) 100 MG tablet Take 1 tablet (100 mg total) by mouth 2 (two) times daily. 11/30/22  Yes Azucena Fallen, MD  Amino Acids-Protein Hydrolys (FEEDING SUPPLEMENT, PRO-STAT SUGAR FREE 64,) LIQD Take 30 mLs by mouth 2 (two) times daily between meals.   Yes [provider]  ascorbic acid (VITAMIN C) 500 MG tablet Take 500 mg by mouth daily.   Yes [provider]  atorvastatin (LIPITOR) 10 MG tablet Take 10 mg by mouth every evening. 12/03/21  Yes [provider]  busPIRone (BUSPAR) 5 MG tablet  Take 5 mg by mouth 3 (three) times daily.   Yes [provider]  cefadroxil (DURICEF) 500 MG capsule Take 1 capsule (500 mg total) by mouth daily for 14 days. 02/23/23  Yes Leroy Sea, MD  cefTAZidime 0.5 g in dextrose 5 % 50 mL Inject 0.5 g into the vein daily. 02/15/23  Yes Leroy Sea, MD  cetirizine (ZYRTEC) 10 MG tablet Take 10 mg by mouth daily. 08/05/22  Yes [provider]  colchicine 0.6 MG tablet Take 0.5 tablets (0.3 mg total) by mouth 2 (two) times a week. Patient not taking: Reported on 01/29/2023 12/02/22   Azucena Fallen, MD  doxycycline (VIBRA-TABS) 100 MG tablet Take 1 tablet (100 mg total) by mouth 2 (two) times daily. 02/23/23  Yes Leroy Sea, MD  famotidine (PEPCID) 20 MG tablet Take 20 mg by mouth daily before breakfast.   Yes [provider]  ferric citrate (AURYXIA) 1 GM 210 MG(Fe) tablet Take 2 tablets (420 mg total) by mouth 3 (three) times daily with meals. Patient taking differently: Take 210 mg by mouth 3 (three) times daily with meals. 11/30/22  Yes Azucena Fallen, MD  fluticasone [facility]) 50 MCG/ACT nasal spray Place 2 sprays into both nostrils daily as needed for allergies or rhinitis. 12/19/18  Yes Rai, Ripudeep K, MD  folic acid (FOLVITE) 1 MG tablet Take 1 tablet (1 mg total) by mouth daily. 01/10/23  Yes Danford, Earl Lites, MD  gabapentin (NEURONTIN) 100 MG capsule Take 1 capsule (100 mg total) by mouth 3 (three) times daily. 11/30/22  Yes Azucena Fallen, MD  HYDROmorphone (DILAUDID) 4 MG tablet Take 1 tablet (4 mg total) by mouth See admin instructions. Give 4 mg (1 tablet) three times daily for pain. Give first dose at 0600 on dialysis days (T-Th-Sat), otherwise give first dose at 0900. May give an additional tablet every 24 hours as needed for severe pain (do not administer within 3 hours of scheduled dose). 02/15/23  Yes Leroy Sea, MD  hydrOXYzine (ATARAX) 25 MG tablet Take 1 tablet (25 mg  total) by mouth 3 (three) times daily as needed for anxiety. Patient taking differently: Take 25 mg by mouth See admin instructions. Give 25 mg (1 tablet) every 6 hours as needed for anxiety, agitation for 14 days (starting 11/19), then every 8 hours as needed. 01/10/23  Yes Danford, Earl Lites, MD  insulin glargine (SEMGLEE, YFGN,) 100 UNIT/ML Solostar Pen Inject 8 Units into the skin at bedtime.   Yes [provider]  insulin lispro (HUMALOG) 100 UNIT/ML KwikPen Before each meal 3 times a day, 140-199 - 2 units, 200-250 - 6 units, 251-299 - 8 units,  300-349 - 12 units,  350 or above 14 units. Patient taking differently: Inject 0-14 Units into the skin See admin instructions. Inject 0-14 units per sliding scale before meals: < 70 notify MD 70-139 : 0 units 140-199 : 2 units 200-250 : 6 units 251-299 : 8 units 300-349 : 12 units 350-400 : 14 units > 401 notify MD. 06/04/22  Yes Leroy Sea, MD  loperamide (IMODIUM) 2 MG capsule Take 1 capsule (2 mg total) by mouth as needed for diarrhea or loose stools. Patient taking differently: Take 2 mg by mouth daily as needed for diarrhea or loose stools. 01/10/23  Yes Danford, Earl Lites, MD  methocarbamol (ROBAXIN) 500 MG tablet Take 1 tablet (500 mg total) by mouth every 6 (six) hours as needed for muscle spasms. 01/10/23  Yes Danford, Earl Lites, MD  metoprolol tartrate (LOPRESSOR) 50 MG tablet Take 1 tablet (50 mg total) by mouth 2 (two) times daily. 01/10/23  Yes Danford, Earl Lites, MD  metroNIDAZOLE (FLAGYL) 500 MG tablet Take 1 tablet (500 mg total) by mouth every 12 (twelve) hours. 02/15/23  Yes Leroy Sea, MD  mirtazapine (REMERON) 15 MG tablet Take 15 mg by mouth at bedtime. 10/18/22  Yes [provider]  Multiple Vitamins-Minerals (SENIOR MULTIVITAMIN PLUS PO) Take 1 tablet by mouth daily.   Yes [provider]  nutrition supplement, JUVEN, (JUVEN) PACK Take 1 packet by mouth 2 (two) times daily  between meals. 01/10/23  Yes Danford, Earl Lites, MD  Nutritional Supplement LIQD Take 120-237 mLs by mouth See admin instructions. House supplement. Give 120 mL in the morning at 0900 and 237 mL twice daily between meals (1000, 1400)   Yes [provider]  Nutritional Supplements (,FEEDING SUPPLEMENT, PROSOURCE PLUS) liquid Take 30 mLs by mouth 2 (two) times daily between meals. 01/10/23  Yes Danford, Earl Lites, MD  pantoprazole (PROTONIX) 40 MG tablet Take 40 mg by mouth 2 (two) times daily. Patient not taking: Reported on 02/17/2023    [provider]  vancomycin (VANCOCIN) 1-5 GM/200ML-% SOLN Inject 200 mLs (1,000 mg total) into the vein every Monday, Wednesday, and Friday with hemodialysis. 02/15/23  Yes Leroy Sea, MD  zinc sulfate 220 (50 Zn) MG capsule Take 1 capsule (220 mg total) by mouth daily. Patient not taking: Reported on 02/17/2023 01/10/23   Alberteen Sam, MD    Physical Exam: Vitals:   02/17/23 2041 02/17/23 2043 02/17/23 2044 02/17/23 2045  BP: 105/79   123/84  Pulse: (!) 129 (!) 136  60  Resp: 19 (!) 21  15  Temp:   98.1 F (36.7 C)   TempSrc:   Oral   SpO2: 100% 100%  92%   Constitutional: NAD, calm, comfortable, chronically ill-appearing morbidly obese female lying in bed Eyes:  lids and conjunctivae normal ENMT: Mucous membranes are dry Neck: normal, supple, no masses, no thyromegaly Respiratory: clear to auscultation bilaterally, no wheezing, no crackles. Normal respiratory effort. No accessory muscle use.  Cardiovascular: Regular rate and rhythm, no murmurs / rubs / gallops. No extremity edema.  Abdomen: Soft, nondistended, mild mid abdominal pain.  No rebound tenderness, guarding or rigidity musculoskeletal: no clubbing / cyanosis.  Right BKA stump Skin: Right BKA stump with healing stapled wound without any erythema, drainage or increased warmth to touch. Left lateral heel ulcer with granulation tissue in the bed of the  ulcer without any surrounding erythema or drainage.      Neurologic: CN 2-12 grossly intact. Sensation intact Psychiatric:  Alert and oriented x 3. Normal mood.   Data Reviewed:  See HPI  Assessment and Plan: * Abdominal pain -Unclear etiology.  Pt with history of gastroparesis although pain is located in the mid abdomen distal to her epigastric region. LFTs and lipase within normal limits. CT abdomen pelvis without contrast was negative for acute findings and showed only known stable uterine fibroid.  She also only had mild tenderness on exam. -Patient has allergies to Reglan.  Will trial IV Benadryl x 1, IV PPI, PRN antiemetics -PRN IV dilaudid but will need to continue to space out and taper.  Patient has documented history of narcotic seeking behavior with history of chronic pain.   PAF (paroxysmal atrial fibrillation) (HCC) -currently in sinus tachycardia -Continue beta-blocker - Not on anticoagulation due to history of GI bleed  Morbid obesity (HCC) Noted  Cellulitis of heel, left -continue antibiotics regimen as below which was recommended by Dr. Luciana Axe ID on 02/15/2023  Vanc + Ceftaz ( with HD) - + oral Metronidazole x 2 weeks (EOT 12/17)  2. Then would start Doxy 100 mg bid + Flagyl 500 twice daily, Cefadroxil 500 p.o. twice daily mg  with follow with ID in the office post discharge  Sinus tachycardia Suspect secondary to volume depletion especially with her hemoconcentration on CBC -Lower suspicion for infection as patient has only missed about 2 days of antibiotics, but will check blood cultures -Will keep on gentle low-dose IV fluids overnight  Right below-knee amputee (HCC) - History of sepsis secondary to right BKA stump infection with wound dehiscence with revision on 02/02/2023. Wound appears to be healing well without signs of infection -continue dry dressing changes per ortho in recent hospitalization -Will  obtain right stump venous Doppler to rule out DVT  due to worsening pain  (HFpEF) heart failure with preserved ejection fraction (HCC) Stable and compensated  End stage renal disease on dialysis [facility]) -prior MWF scheduled but reports switched to T/Th/Sat. Last HD Tuesday and missed today's session. BMP stable -need scheduled dialysis on Sat  Anxiety Depression -continue buspar, PRN hydroxyzine, mirtazapine      Advance Care Planning:   Code Status: Full Code   Consults: NONE  Family Communication: husband at bedside  Severity of Illness: The appropriate patient status for this patient is OBSERVATION. Observation status is judged to be reasonable and necessary in order to provide the required intensity of service to ensure the patient's safety. The patient's presenting symptoms, physical exam findings, and initial radiographic and laboratory data in the context of their medical condition is felt to place them at decreased risk for further clinical deterioration. Furthermore, it is anticipated that the patient will be medically stable for discharge from the hospital within 2 midnights of admission.   Author: Anselm Jungling, DO 02/17/2023 9:33 PM  For on call review [email or web address].

## 2023-02-17 NOTE — Assessment & Plan Note (Signed)
-  continue antibiotics regimen as below which was recommended by Dr. Luciana Axe ID on 02/15/2023  Vanc + Ceftaz ( with HD) - + oral Metronidazole x 2 weeks (EOT 12/17)  2. Then would start Doxy 100 mg bid + Flagyl 500 twice daily, Cefadroxil 500 p.o. twice daily mg  with follow with ID in the office post discharge

## 2023-02-17 NOTE — ED Provider Triage Note (Signed)
Emergency Medicine Provider Triage Evaluation Note  Deborah [name] , a 57 y.o. female  was evaluated in triage.  Pt complains of right leg pain and abdominal pain starting about an hour ago.  Missed dialysis due to this.  Review of Systems  Positive: Abdominal pain, right leg pain Negative: Fevers, chest pain  Physical Exam  BP (!) 145/118   Pulse (!) 120   Temp 98.2 F (36.8 C)   Resp 20   LMP 10/10/2012   SpO2 100%  Gen:   Awake, crying in pain Resp:  Normal effort MSK:   Right BKA wrapped Other:  Diffuse abdominal tenderness  Medical Decision Making  Medically screening exam initiated at 11:26 AM.  Appropriate orders placed.  Deborah [name] was informed that the remainder of the evaluation will be completed by another provider, this initial triage assessment does not replace that evaluation, and the importance of remaining in the ED until their evaluation is complete.  Labs and CT imaging ordered.  Will need cardiac monitoring and EKG given missed dialysis.  Informed charge nurse of need for room urgently.   Deborah Loveless, MD 02/17/23 [phone]

## 2023-02-17 NOTE — Assessment & Plan Note (Signed)
Noted  

## 2023-02-17 NOTE — Assessment & Plan Note (Addendum)
-   History of sepsis secondary to right BKA stump infection with wound dehiscence with revision on 02/02/2023. Wound appears to be healing well without signs of infection -continue dry dressing changes per ortho in recent hospitalization -Will obtain right stump venous Doppler to rule out DVT due to worsening pain

## 2023-02-17 NOTE — Assessment & Plan Note (Signed)
Suspect secondary to volume depletion especially with her hemoconcentration on CBC -Lower suspicion for infection as patient has only missed about 2 days of antibiotics, but will check blood cultures -Will keep on gentle low-dose IV fluids overnight

## 2023-02-17 NOTE — ED Triage Notes (Addendum)
Pt is coming in for 10/10 upper epigastric abd pain, states the pain started 1hr ago, she ws supposed to go to dialysis this morning, she ended up not going, last time she went to dialysis was this past Monday after her knee appointment, she is now Tuesday/Thursday/Saturday. She also has nausea but no vomiting.   Medic vitals   154/110 120hr 98% 22rr [id number]

## 2023-02-17 NOTE — ED Provider Notes (Signed)
  Physical Exam  BP 125/89   Pulse (!) 110   Temp 98.2 F (36.8 C)   Resp (!) 21   LMP 10/10/2012   SpO2 100%     Procedures  Procedures  ED Course / MDM   Clinical Course as of 02/17/23 1903  Thu Feb 17, 2023  1521 Signed out to Dr. Karene Fry pending CT scans, labs [WS]  1855 WBC(!): 12.9 [JL]  1855 Pulse Rate(!): 120 [JL]    Clinical Course User Index [JL] Ernie Avena, MD [WS] Lonell Grandchild, MD   Medical Decision Making Amount and/or Complexity of Data Reviewed Labs: ordered. Decision-making details documented in ED Course. Radiology: ordered.  Risk Prescription drug management. Decision regarding hospitalization.   73F with multiple medical comorbidities, recently Dc'd from the hospital 2 days ago, recent right BKA revision due to infection. Plan for IV ABX outpatient. Missed dialysis today, epigastric pain. CT Knee and Abd Pel pending. Likely readmit. Plan for ABX now, likely admit following CT results.    CT Abd Pel WO:  IMPRESSION:  No acute findings.    Stable 4.6 cm pedunculated fibroid in left adnexal region.    CT R Knee:  IMPRESSION:  1. Postsurgical changes from below-knee amputation with 2.5 x 6.6 x  3.3 cm partially organized simple fluid collection overlying the  superficial fascia anteromedially. Favor postoperative seroma over  abscess in the absence of any open wound or purulent drainage.  2. No CT evidence of osteomyelitis.  3. Advanced tricompartmental osteoarthritis with multiple  intra-articular bodies.  4. 2.8 cm osteochondroma arising from medial proximal tibia.   On my evaluation, the patient was complaining of severe 10 out of 10 pain.  BKA revision does not appear overly cellulitic.  Patient meeting SIRS criteria with leukocytosis, tachycardia to the 120s.  Initial plan had been for outpatient antibiotics however in the setting of patient's intractable pain, will plan to admit for pain control and IV antibiotics. Hospitalist  medicine consulted for admission, Dr. Cyndia Bent accepting.   Ernie Avena, MD 02/18/23 [phone]

## 2023-02-17 NOTE — Assessment & Plan Note (Addendum)
Depression -continue buspar, PRN hydroxyzine, mirtazapine

## 2023-02-17 NOTE — Progress Notes (Unsigned)
Subjective:    Patient ID: Deborah [name], female    DOB: 10-Jun-1965, 57 y.o.   MRN: [id number]  HPI   Pain Inventory Average Pain {NUMBERS; 0-10:5044} Pain Right Now {NUMBERS; 0-10:5044} My pain is {PAIN DESCRIPTION:21022940}  In the last 24 hours, has pain interfered with the following? General activity {NUMBERS; 0-10:5044} Relation with others {NUMBERS; 0-10:5044} Enjoyment of life {NUMBERS; 0-10:5044} What TIME of day is your pain at its worst? {time of day:24191} Sleep (in general) {BHH GOOD/FAIR/POOR:22877}  Pain is worse with: {ACTIVITIES:21022942} Pain improves with: {PAIN IMPROVES WUJW:11914782} Relief from Meds: {NUMBERS; 0-10:5044}  Family History  Problem Relation Age of Onset   Diabetes Mother    Diabetes Father    Heart disease Father    Diabetes Sister    Congestive Heart Failure Sister 40   Diabetes Brother    Social History   Socioeconomic History   Marital status: Married    Spouse name: Not on file   Number of children: Not on file   Years of education: Not on file   Highest education level: Not on file  Occupational History   Not on file  Tobacco Use   Smoking status: Never   Smokeless tobacco: Never  Vaping Use   Vaping status: Never Used  Substance and Sexual Activity   Alcohol use: No   Drug use: No   Sexual activity: Not Currently    Birth control/protection: None  Other Topics Concern   Not on file  Social History Narrative   ** Merged History Encounter **       Social Drivers of Health   Financial Resource Strain: Low Risk (03/23/2021)   Received from [facility])   Financial Resource Strain  Food Insecurity: No Food Insecurity (01/30/2023)   Hunger Vital Sign    Worried About Running Out of Food in the Last Year: Never true    Ran Out of Food in the Last Year: Never true  Recent Concern: Food Insecurity - High Risk (01/25/2023)   Received from [facility]   Hunger Vital Sign    Worried About  Running Out of Food in the Last Year: Patient declined to answer    Ran Out of Food in the Last Year: Often true  Transportation Needs: No Transportation Needs (01/30/2023)   PRAPARE - Administrator, Civil Service (Medical): No    Lack of Transportation (Non-Medical): No  Recent Concern: Transportation Needs - Unmet Transportation Needs (01/25/2023)   Received from [facility]    In the past 12 months, has lack of reliable transportation kept you from medical appointments, meetings, work or from getting things needed for daily living? : Yes  Physical Activity: Not on File (10/31/2017)   Received from [place], [place]   Physical Activity    Physical Activity: 0  Stress: Low Risk (03/23/2021)   [facility]   Stress    Over the last 2 weeks, how often have you been bothered by the following problems: feeling nervous, anxious, on edge?: Not at all    Over the last 2 weeks, how often have you been bothered by the following problems: Not being able to stop or control worrying?: Not at all  Social Connections: Unknown (07/19/2021)   [facility]   Social Network    Social Network: Not on file   Past Surgical History:  Procedure Laterality Date  ABDOMINAL AORTOGRAM W/LOWER EXTREMITY N/A 11/29/2022   Procedure: ABDOMINAL AORTOGRAM W/LOWER EXTREMITY;  Surgeon: Daria Pastures, MD;  Location[facility] INVASIVE CV LAB;  Service: Cardiovascular;  Laterality: N/A;   AMPUTATION Right 12/29/2022   Procedure: RIGHT BELOW KNEE AMPUTATION;  Surgeon: Nadara Mustard, MD;  Location: [facility] OR;  Service: Orthopedics;  Laterality: Right;   AV FISTULA PLACEMENT Left 06/30/2022   Procedure: LEFT BRACHIOCEPHALIC ARTERIOVENOUS (AV) FISTULA CREATION;  Surgeon: Cephus Shelling, MD;  Location: [facility] OR;  Service: Vascular;  Laterality: Left;   BIOPSY  07/27/2019   Procedure: BIOPSY;  Surgeon: Vida Rigger,  MD;  Location: [facility] ENDOSCOPY;  Service: Endoscopy;;   BIOPSY  07/30/2019   Procedure: BIOPSY;  Surgeon: Kathi Der, MD;  Location: [facility] ENDOSCOPY;  Service: Gastroenterology;;   CATARACT EXTRACTION  01/2014   CHOLECYSTECTOMY     COLONOSCOPY WITH PROPOFOL N/A 07/30/2019   Procedure: COLONOSCOPY WITH PROPOFOL;  Surgeon: Kathi Der, MD;  Location: [facility] ENDOSCOPY;  Service: Gastroenterology;  Laterality: N/A;   ESOPHAGOGASTRODUODENOSCOPY N/A 07/27/2019   Procedure: ESOPHAGOGASTRODUODENOSCOPY (EGD);  Surgeon: Vida Rigger, MD;  Location: [facility] ENDOSCOPY;  Service: Endoscopy;  Laterality: N/A;   ESOPHAGOGASTRODUODENOSCOPY N/A 07/26/2020   Procedure: ESOPHAGOGASTRODUODENOSCOPY (EGD);  Surgeon: Willis Modena, MD;  Location: [facility] ENDOSCOPY;  Service: Endoscopy;  Laterality: N/A;   ESOPHAGOGASTRODUODENOSCOPY (EGD) WITH PROPOFOL N/A 08/02/2019   Procedure: ESOPHAGOGASTRODUODENOSCOPY (EGD) WITH PROPOFOL;  Surgeon: Kathi Der, MD;  Location: [facility];  Service: Gastroenterology;  Laterality: N/A;   ESOPHAGOGASTRODUODENOSCOPY (EGD) WITH PROPOFOL N/A 12/23/2022   Procedure: ESOPHAGOGASTRODUODENOSCOPY (EGD) WITH PROPOFOL;  Surgeon: Shellia Cleverly, DO;  Location: [facility] ENDOSCOPY;  Service: Gastroenterology;  Laterality: N/A;   FISTULA SUPERFICIALIZATION Left 12/31/2022   Procedure: LEFT ARM FISTULA TRANSPOSITION;  Surgeon: Maeola Harman, MD;  Location: [facility] OR;  Service: Vascular;  Laterality: Left;   GIVENS CAPSULE STUDY N/A 12/23/2022   Procedure: GIVENS CAPSULE STUDY;  Surgeon: Shellia Cleverly, DO;  Location: [facility] ENDOSCOPY;  Service: Gastroenterology;  Laterality: N/A;   HEMOSTASIS CLIP PLACEMENT  08/02/2019   Procedure: HEMOSTASIS CLIP PLACEMENT;  Surgeon: Kathi Der, MD;  Location: [facility]OSCOPY;  Service: Gastroenterology;;   HOT HEMOSTASIS N/A 12/23/2022   Procedure: HOT HEMOSTASIS (ARGON PLASMA COAGULATION/BICAP);  Surgeon: Shellia Cleverly, DO;  Location: [facility] ENDOSCOPY;   Service: Gastroenterology;  Laterality: N/A;   IR FLUORO GUIDE CV LINE RIGHT  06/24/2022   IR US GUIDE VASC ACCESS RIGHT  06/24/2022   POLYPECTOMY  07/30/2019   Procedure: POLYPECTOMY;  Surgeon: Kathi Der, MD;  Location: [facility];  Service: Gastroenterology;;   POLYPECTOMY  08/02/2019   Procedure: POLYPECTOMY;  Surgeon: Kathi Der, MD;  Location: [facility];  Service: Gastroenterology;;   STUMP REVISION Right 02/02/2023   Procedure: REVISION RIGHT BELOW KNEE AMPUTATION;  Surgeon: Nadara Mustard, MD;  Location: [facility] OR;  Service: Orthopedics;  Laterality: Right;   Past Surgical History:  Procedure Laterality Date   ABDOMINAL AORTOGRAM W/LOWER EXTREMITY N/A 11/29/2022   Procedure: ABDOMINAL AORTOGRAM W/LOWER EXTREMITY;  Surgeon: Daria Pastures, MD;  Location: [facility] INVASIVE CV LAB;  Service: Cardiovascular;  Laterality: N/A;   AMPUTATION Right 12/29/2022   Procedure: RIGHT BELOW KNEE AMPUTATION;  Surgeon: Nadara Mustard, MD;  Location: [facility] OR;  Service: Orthopedics;  Laterality: Right;   AV FISTULA PLACEMENT Left 06/30/2022   Procedure: LEFT BRACHIOCEPHALIC ARTERIOVENOUS (AV) FISTULA CREATION;  Surgeon: Cephus Shelling, MD;  Location: [facility] OR;  Service: Vascular;  Laterality: Left;   BIOPSY  07/27/2019   Procedure:  BIOPSY;  Surgeon: Vida Rigger, MD;  Location: [facility] ENDOSCOPY;  Service: Endoscopy;;   BIOPSY  07/30/2019   Procedure: BIOPSY;  Surgeon: Kathi Der, MD;  Location: [facility];  Service: Gastroenterology;;   CATARACT EXTRACTION  01/2014   CHOLECYSTECTOMY     COLONOSCOPY WITH PROPOFOL N/A 07/30/2019   Procedure: COLONOSCOPY WITH PROPOFOL;  Surgeon: Kathi Der, MD;  Location: [facility];  Service: Gastroenterology;  Laterality: N/A;   ESOPHAGOGASTRODUODENOSCOPY N/A 07/27/2019   Procedure: ESOPHAGOGASTRODUODENOSCOPY (EGD);  Surgeon: Vida Rigger, MD;  Location: [facility] ENDOSCOPY;  Service: Endoscopy;  Laterality: N/A;   ESOPHAGOGASTRODUODENOSCOPY N/A 07/26/2020    Procedure: ESOPHAGOGASTRODUODENOSCOPY (EGD);  Surgeon: Willis Modena, MD;  Location: [facility] ENDOSCOPY;  Service: Endoscopy;  Laterality: N/A;   ESOPHAGOGASTRODUODENOSCOPY (EGD) WITH PROPOFOL N/A 08/02/2019   Procedure: ESOPHAGOGASTRODUODENOSCOPY (EGD) WITH PROPOFOL;  Surgeon: Kathi Der, MD;  Location: [facility] ENDOSCOPY;  Service: Gastroenterology;  Laterality: N/A;   ESOPHAGOGASTRODUODENOSCOPY (EGD) WITH PROPOFOL N/A 12/23/2022   Procedure: ESOPHAGOGASTRODUODENOSCOPY (EGD) WITH PROPOFOL;  Surgeon: Shellia Cleverly, DO;  Location: [facility] ENDOSCOPY;  Service: Gastroenterology;  Laterality: N/A;   FISTULA SUPERFICIALIZATION Left 12/31/2022   Procedure: LEFT ARM FISTULA TRANSPOSITION;  Surgeon: Maeola Harman, MD;  Location: [facility] OR;  Service: Vascular;  Laterality: Left;   GIVENS CAPSULE STUDY N/A 12/23/2022   Procedure: GIVENS CAPSULE STUDY;  Surgeon: Shellia Cleverly, DO;  Location: [facility] ENDOSCOPY;  Service: Gastroenterology;  Laterality: N/A;   HEMOSTASIS CLIP PLACEMENT  08/02/2019   Procedure: HEMOSTASIS CLIP PLACEMENT;  Surgeon: Kathi Der, MD;  Location: [facility] ENDOSCOPY;  Service: Gastroenterology;;   HOT HEMOSTASIS N/A 12/23/2022   Procedure: HOT HEMOSTASIS (ARGON PLASMA COAGULATION/BICAP);  Surgeon: Shellia Cleverly, DO;  Location: [facility] ENDOSCOPY;  Service: Gastroenterology;  Laterality: N/A;   IR FLUORO GUIDE CV LINE RIGHT  06/24/2022   IR US GUIDE VASC ACCESS RIGHT  06/24/2022   POLYPECTOMY  07/30/2019   Procedure: POLYPECTOMY;  Surgeon: Kathi Der, MD;  Location: [facility] END[facility];  Service: Gastroenterology;;   POLYPECTOMY  08/02/2019   Procedure: POLYPECTOMY;  Surgeon: Kathi Der, MD;  Location: [facility] END[facility];  Service: Gastroenterology;;   STUMP REVISION Right 02/02/2023   Procedure: REVISION RIGHT BELOW KNEE AMPUTATION;  Surgeon: Nadara Mustard, MD;  Location: [facility] OR;  Service: Orthopedics;  Laterality: Right;   Past Medical History:  Diagnosis Date   Acute back pain  with sciatica, left    Acute back pain with sciatica, right    AKI (acute kidney injury) (HCC)    Anemia, unspecified    Cancer (HCC)    Carcinoid tumor of duodenum    Chest pain with normal coronary angiography 2019   Chronic kidney disease, stage 3b (HCC)    Chronic pain    Chronic systolic CHF (congestive heart failure) (HCC)    Diabetes mellitus    DKA (diabetic ketoacidosis) (HCC)    Drug-seeking behavior    21 hospitalizations and 14 CT a/p in 2 years for N/V and abdominal pain, demanding only IV dilaudid   Elevated troponin    chronic   Esophageal reflux    Fibromyalgia    Gastric ulcer    Gastroparesis    Gout    Hyperlipidemia    Hypertension    Hypokalemia    Hypomagnesemia    Lumbosacral stenosis    LVH (left ventricular hypertrophy)    Morbid obesity (HCC)    NICM (nonischemic cardiomyopathy) (HCC)    PAF (paroxysmal atrial fibrillation) (HCC)    Stroke (HCC) 02/2011   Thrombocytosis  Vitamin B12 deficiency anemia    LMP 10/10/2012   Opioid Risk Score:   Fall Risk Score:  `1  Depression screen PHQ 2/9     11/18/2022    1:41 PM 09/14/2022    1:23 PM 07/20/2022    1:05 PM 09/16/2021    9:16 AM 06/01/2021    1:55 PM 08/01/2020    2:56 PM 06/05/2020    3:01 PM  Depression screen PHQ 2/9  Decreased Interest 3 1 3 3 1 1 1   Down, Depressed, Hopeless 3 1 2 3 1 1 1   PHQ - 2 Score 6 2 5 6 2 2 2   Altered sleeping 3        Tired, decreased energy 3        Change in appetite 3        Feeling bad or failure about yourself  3        Trouble concentrating 3        Moving slowly or fidgety/restless 3        Suicidal thoughts 3        PHQ-9 Score 27          Review of Systems     Objective:   Physical Exam        Assessment & Plan:

## 2023-02-17 NOTE — Assessment & Plan Note (Signed)
-  currently in sinus tachycardia -Continue beta-blocker - Not on anticoagulation due to history of GI bleed

## 2023-02-17 NOTE — Assessment & Plan Note (Signed)
-  prior MWF scheduled but reports switched to T/Th/Sat. Last HD Tuesday and missed today's session. BMP stable -need scheduled dialysis on Sat

## 2023-02-17 NOTE — ED Notes (Signed)
ED TO INPATIENT HANDOFF REPORT  ED Nurse Name and Phone #: Trinna Post, RN [phone]  S Name/Age/Gender Deborah [name] 57 y.o. female Room/Bed: 026C/026C  Code Status   Code Status: Prior  Home/SNF/Other Home Patient oriented to: self, place, time, and situation Is this baseline? Yes   Triage Complete: Triage complete  Chief Complaint Abd Pain  Triage Note Pt is coming in for 10/10 upper epigastric abd pain, states the pain started 1hr ago, she ws supposed to go to dialysis this morning, she ended up not going, last time she went to dialysis was this past Monday after her knee appointment, she is now Tuesday/Thursday/Saturday. She also has nausea but no vomiting.   Medic vitals   154/110 120hr 98% 22rr 298bgl   Allergies Allergies  Allergen Reactions   Isovue [Iopamidol] Anaphylaxis, [name] and Other (See Comments)    11/28/17 Patient had seizure like activity and then 1 min code after 100 cc of isovue 300. Possible contrast allergy vs vasovagal episode  Cardiac Arrest   Neurontin [Gabapentin] [name] and Swelling   Nsaids Anaphylaxis and Other (See Comments)    Hx of stomach ulcers   Penicillins Itching, Palpitations and Other (See Comments)    Flushing (Red Skin) Laryngeal Edema   Reglan [Metoclopramide] Other (See Comments)    Tardive dyskinesia    Valium [Diazepam] [name]   Zestril [Lisinopril] Anaphylaxis and Swelling    Tongue and mouth swelling Laryngeal Edema   Tolectin [Tolmetin] Nausea And Vomiting, Nausea Only and Other (See Comments)    Irritates stomach ulcer   Asa [Aspirin] Other (See Comments)    Hx of stomach ulcer   Aspartame And Phenylalanine Hives   Bentyl [Dicyclomine] Other (See Comments)    Chest pain   Hibiclens [Chlorhexidine Gluconate] Other (See Comments)    Dermatitis    Flexeril [Cyclobenzaprine] Palpitations   Oxycontin [Oxycodone] Palpitations   Rifamycins Palpitations   Tylenol  [Acetaminophen] Nausea And Vomiting, Nausea Only and Other (See Comments)    Irritates stomach ulcer Abdominal pain   Ultram [Tramadol] Nausea And Vomiting and Palpitations    Level of Care/Admitting Diagnosis ED Disposition     ED Disposition  Admit   Condition  --   Comment  The patient appears reasonably stabilized for admission considering the current resources, flow, and capabilities available in the ED at this time, and I doubt any other [facility] requiring further screening and/or treatment in the ED prior to admission is  present.          B Medical/Surgery History Past Medical History:  Diagnosis Date   Acute back pain with sciatica, left    Acute back pain with sciatica, right    AKI (acute kidney injury) (HCC)    Anemia, unspecified    Cancer (HCC)    Carcinoid tumor of duodenum    Chest pain with normal coronary angiography 2019   Chronic kidney disease, stage 3b (HCC)    Chronic pain    Chronic systolic CHF (congestive heart failure) (HCC)    Diabetes mellitus    DKA (diabetic ketoacidosis) (HCC)    Drug-seeking behavior    21 hospitalizations and 14 CT a/p in 2 years for N/V and abdominal pain, demanding only IV dilaudid   Elevated troponin    chronic   Esophageal reflux    Fibromyalgia    Gastric ulcer    Gastroparesis    Gout    Hyperlipidemia    Hypertension  Hypokalemia    Hypomagnesemia    Lumbosacral stenosis    LVH (left ventricular hypertrophy)    Morbid obesity (HCC)    NICM (nonischemic cardiomyopathy) (HCC)    PAF (paroxysmal atrial fibrillation) (HCC)    Stroke (HCC) 02/2011   Thrombocytosis    Vitamin B12 deficiency anemia    Past Surgical History:  Procedure Laterality Date   ABDOMINAL AORTOGRAM W/LOWER EXTREMITY N/A 11/29/2022   Procedure: ABDOMINAL AORTOGRAM W/LOWER EXTREMITY;  Surgeon: Daria Pastures, MD;  Location: [facility] INVASIVE CV LAB;  Service: Cardiovascular;  Laterality: N/A;   AMPUTATION Right 12/29/2022   Procedure:  RIGHT BELOW KNEE AMPUTATION;  Surgeon: Nadara Mustard, MD;  Location: [facility] OR;  Service: Orthopedics;  Laterality: Right;   AV FISTULA PLACEMENT Left 06/30/2022   Procedure: LEFT BRACHIOCEPHALIC ARTERIOVENOUS (AV) FISTULA CREATION;  Surgeon: Cephus Shelling, MD;  Location[facility] OR;  Service: Vascular;  Laterality: Left;   BIOPSY  07/27/2019   Procedure: BIOPSY;  Surgeon: Vida Rigger, MD;  Location: [facility] ENDOSCOPY;  Service: Endoscopy;;   BIOPSY  07/30/2019   Procedure: BIOPSY;  Surgeon: Kathi Der, MD;  Location: [facility] ENDOSCOPY;  Service: Gastroenterology;;   CATARACT EXTRACTION  01/2014   CHOLECYSTECTOMY     COLONOSCOPY WITH PROPOFOL N/A 07/30/2019   Procedure: COLONOSCOPY WITH PROPOFOL;  Surgeon: Kathi Der, MD;  Location: [facility] ENDOSCOPY;  Service: Gastroenterology;  Laterality: N/A;   ESOPHAGOGASTRODUODENOSCOPY N/A 07/27/2019   Procedure: ESOPHAGOGASTRODUODENOSCOPY (EGD);  Surgeon: Vida Rigger, MD;  Location: [facility] ENDOSCOPY;  Service: Endoscopy;  Laterality: N/A;   ESOPHAGOGASTRODUODENOSCOPY N/A 07/26/2020   Procedure: ESOPHAGOGASTRODUODENOSCOPY (EGD);  Surgeon: Willis Modena, MD;  Location: [facility] ENDOSCOPY;  Service: Endoscopy;  Laterality: N/A;   ESOPHAGOGASTRODUODENOSCOPY (EGD) WITH PROPOFOL N/A 08/02/2019   Procedure: ESOPHAGOGASTRODUODENOSCOPY (EGD) WITH PROPOFOL;  Surgeon: Kathi Der, MD;  Location: [facility] ENDOSCOPY;  Service: Gastroenterology;  Laterality: N/A;   ESOPHAGOGASTRODUODENOSCOPY (EGD) WITH PROPOFOL N/A 12/23/2022   Procedure: ESOPHAGOGASTRODUODENOSCOPY (EGD) WITH PROPOFOL;  Surgeon: Shellia Cleverly, DO;  Location: [facility] ENDOSCOPY;  Service: Gastroenterology;  Laterality: N/A;   FISTULA SUPERFICIALIZATION Left 12/31/2022   Procedure: LEFT ARM FISTULA TRANSPOSITION;  Surgeon: Maeola Harman, MD;  Location: [facility] OR;  Service: Vascular;  Laterality: Left;   GIVENS CAPSULE STUDY N/A 12/23/2022   Procedure: GIVENS CAPSULE STUDY;  Surgeon: Shellia Cleverly, DO;   Location: [facility] ENDOSCOPY;  Service: Gastroenterology;  Laterality: N/A;   HEMOSTASIS CLIP PLACEMENT  08/02/2019   Procedure: HEMOSTASIS CLIP PLACEMENT;  Surgeon: Kathi Der, MD;  Location: [facility] ENDOSCOPY;  Service: Gastroenterology;;   HOT HEMOSTASIS N/A 12/23/2022   Procedure: HOT HEMOSTASIS (ARGON PLASMA COAGULATION/BICAP);  Surgeon: Shellia Cleverly, DO;  Location: [facility] ENDOSCOPY;  Service: Gastroenterology;  Laterality: N/A;   IR FLUORO GUIDE CV LINE RIGHT  06/24/2022   IR US GUIDE VASC ACCESS RIGHT  06/24/2022   POLYPECTOMY  07/30/2019   Procedure: POLYPECTOMY;  Surgeon: Kathi Der, MD;  Location: [facility] ENDOSCOPY;  Service: Gastroenterology;;   POLYPECTOMY  08/02/2019   Procedure: POLYPECTOMY;  Surgeon: Kathi Der, MD;  Location: [facility] ENDOSCOPY;  Service: Gastroenterology;;   STUMP REVISION Right 02/02/2023   Procedure: REVISION RIGHT BELOW KNEE AMPUTATION;  Surgeon: Nadara Mustard, MD;  Location: [facility] OR;  Service: Orthopedics;  Laterality: Right;     A IV Location/Drains/Wounds Patient Lines/Drains/Airways Status     Active Line/Drains/Airways     Name Placement date Placement time Site Days   Peripheral IV 02/17/23 18 G Anterior;Proximal;Right Forearm 02/17/23  1420  Forearm  less than  1   Peripheral IV 02/17/23 20 G 1.88" Anterior;Right;Upper Arm 02/17/23  1637  Arm  less than 1   Fistula / Graft Left Upper arm Arteriovenous fistula 06/30/22  1200  Upper arm  232   Hemodialysis Catheter Right Subclavian Double lumen Permanent (Tunneled) 06/24/22  1644  Subclavian  238   Pressure Injury 11/23/22 Ischial tuberosity Left Unstageable - Full thickness tissue loss in which the base of the injury is covered by slough (yellow, tan, gray, green or brown) and/or eschar (tan, brown or black) in the wound bed. 11/23/22  0853  -- 86   Pressure Injury 11/24/22 Ischial tuberosity Right Stage 2 -  Partial thickness loss of dermis presenting as a shallow open injury with a red, pink wound bed  without slough. stage two injury to upper thigh into groin region. 11/24/22  1452  -- 85   Wound / Incision (Open or Dehisced) 06/19/22 Skin tear Sacrum Mid stage 2 small abraison present on admission 06/19/22  1840  Sacrum  243   Wound / Incision (Open or Dehisced) 06/19/22 Heel Left 06/19/22  2100  Heel  243   Wound / Incision (Open or Dehisced) 01/29/23 Amputation Leg Right 01/29/23  2000  Leg  19            Intake/Output Last 24 hours No intake or output data in the 24 hours ending 02/17/23 2022  Labs/Imaging Results for orders placed or performed during the hospital encounter of 02/17/23 (from the past 48 hours)  Lipase, blood     Status: None   Collection Time: 02/17/23  4:43 PM  Result Value Ref Range   Lipase 26 11 - 51 U/L    Comment: Performed at [facility], [address] [place], [place] [address]  Comprehensive metabolic panel     Status: Abnormal   Collection Time: 02/17/23  4:43 PM  Result Value Ref Range   Sodium 143 135 - 145 mmol/L   Potassium 4.0 3.5 - 5.1 mmol/L   Chloride 107 98 - 111 mmol/L   CO2 21 (L) 22 - 32 mmol/L   Glucose, Bld 164 (H) 70 - 99 mg/dL    Comment: Glucose reference range applies only to samples taken after fasting for at least 8 hours.   BUN 31 (H) 6 - 20 mg/dL   Creatinine, Ser 2.37 (H) 0.44 - 1.00 mg/dL   Calcium 8.6 (L) 8.9 - 10.3 mg/dL   Total Protein 6.9 6.5 - 8.1 g/dL   Albumin 2.4 (L) 3.5 - 5.0 g/dL   AST 15 15 - 41 U/L   ALT 8 0 - 44 U/L   Alkaline Phosphatase 59 38 - 126 U/L   Total Bilirubin 0.3 <1.2 mg/dL   GFR, Estimated 8 (L) >60 mL/min    Comment: (NOTE) Calculated using the CKD-EPI Creatinine Equation (2021)    Anion gap 15 5 - 15    Comment: Performed at [facility], [address] [place], [place] [address]  CBC     Status: Abnormal   Collection Time: 02/17/23  4:43 PM  Result Value Ref Range   WBC 12.9 (H) 4.0 - 10.5 K/uL   RBC 3.94 3.87 - 5.11 MIL/uL   Hemoglobin 11.7 (L) 12.0 - 15.0 g/dL    HCT 51.7 61.6 - 07.3 %   MCV 101.0 (H) 80.0 - 100.0 fL   MCH 29.7 26.0 - 34.0 pg   MCHC 29.4 (L) 30.0 - 36.0 g/dL   RDW  17.2 (H) 11.5 - 15.5 %   Platelets 356 150 - 400 K/uL   nRBC 0.0 0.0 - 0.2 %    Comment: Performed at [facility], [address], [place], [place] [address]  Vancomycin, random     Status: None   Collection Time: 02/17/23  4:43 PM  Result Value Ref Range   Vancomycin Rm 11 ug/mL    Comment:        Random Vancomycin therapeutic range is dependent on dosage and time of specimen collection. A peak range is 20-40 ug/mL A trough range is 5-15 ug/mL        Performed at [facility], [address] [place], [place] [address]    CT ABDOMEN PELVIS WO CONTRAST Result Date: 02/17/2023 CLINICAL DATA:  Acute abdominal pain. Postop from lower extremity amputation 3 weeks ago. EXAM: CT ABDOMEN AND PELVIS WITHOUT CONTRAST TECHNIQUE: Multidetector CT imaging of the abdomen and pelvis was performed following the standard protocol without IV contrast. RADIATION DOSE REDUCTION: This exam was performed according to the departmental dose-optimization program which includes automated exposure control, adjustment of the mA and/or kV according to patient size and/or use of iterative reconstruction technique. COMPARISON:  Noncontrast CT on 01/27/2023 FINDINGS: Lower chest: No acute findings. Hepatobiliary: No mass visualized on this unenhanced exam. Prior cholecystectomy. No evidence of biliary obstruction. Pancreas: No mass or inflammatory process visualized on this unenhanced exam. Spleen:  Within normal limits in size. Adrenals/Urinary tract: No evidence of urolithiasis or hydronephrosis. Unremarkable unopacified urinary bladder. Stomach/Bowel: No evidence of obstruction, inflammatory process, or abnormal fluid collections. Vascular/Lymphatic: No pathologically enlarged lymph nodes identified. No evidence of abdominal aortic aneurysm. Reproductive: Uterus is unremarkable. A  solid-appearing mass is seen in the left adnexal region measuring 4.6 cm in maximum diameter, which is contiguous with the left uterine fundus and unchanged since prior studies dating back to 01/11/2020. This is consistent with a pedunculated fibroid. No other masses or free fluid identified. Other:  None. Musculoskeletal:  No suspicious bone lesions identified. IMPRESSION: No acute findings. Stable 4.6 cm pedunculated fibroid in left adnexal region. Electronically Signed   By: Danae Orleans M.D.   On: 02/17/2023 15:52   CT Knee Right Wo Contrast Result Date: 02/17/2023 CLINICAL DATA:  Right knee stump pain. Recent below-knee amputation. EXAM: CT OF THE RIGHT KNEE WITHOUT CONTRAST TECHNIQUE: Multidetector CT imaging of the right knee was performed according to the standard protocol. Multiplanar CT image reconstructions were also generated. RADIATION DOSE REDUCTION: This exam was performed according to the departmental dose-optimization program which includes automated exposure control, adjustment of the mA and/or kV according to patient size and/or use of iterative reconstruction technique. COMPARISON:  Right knee and lower leg x-rays dated May 12, 2022. FINDINGS: Bones/Joint/Cartilage Postsurgical changes from below-knee amputation with sharp bony margins of the tibia and fibula. No bony destruction. No fracture or dislocation. Advanced tricompartmental degenerative changes with multiple intra-articular bodies. 2.8 x 2.6 cm osteochondroma arising from medial proximal tibia. Trace knee joint effusion. Ligaments Ligaments are suboptimally evaluated by CT. Muscles and Tendons Postsurgical changes. Soft tissue Mild soft tissue swelling of the amputation stump with 2.5 x 6.6 x 3.3 cm partially organized simple fluid collection overlying the superficial fascia anteromedially (series 4, image 152). No soft tissue mass. IMPRESSION: 1. Postsurgical changes from below-knee amputation with 2.5 x 6.6 x 3.3 cm partially  organized simple fluid collection overlying the superficial fascia anteromedially. Favor postoperative seroma over abscess in the absence of any  open wound or purulent drainage. 2. No CT evidence of osteomyelitis. 3. 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 4. 2.8 cm osteochondroma arising from medial proximal tibia. Electronically Signed   By: Obie Dredge M.D.   On: 02/17/2023 15:47    Pending Labs Unresulted Labs (From admission, onward)     Start     Ordered   02/17/23 1110  Urinalysis, Routine w reflex microscopic -Urine, Clean Catch  Once,   URGENT       Question:  Specimen Source  Answer:  Urine, Clean Catch   02/17/23 1110            Vitals/Pain Today's Vitals   02/17/23 1553 02/17/23 1815 02/17/23 1915 02/17/23 1916  BP:  (!) 147/134    Pulse:  (!) 127 (!) 130   Resp:  16 20   Temp: 97.7 F (36.5 C)     TempSrc: Oral     SpO2:  98% 100%   PainSc:    10-Worst pain ever    Isolation Precautions No active isolations  Medications Medications  HYDROmorphone (DILAUDID) injection 1 mg (1 mg Intravenous Given 02/17/23 1420)  ondansetron (ZOFRAN) injection 4 mg (4 mg Intravenous Given 02/17/23 1423)  sodium chloride 0.9 % bolus 500 mL (0 mLs Intravenous Stopped 02/17/23 1750)  cefTAZidime (FORTAZ) 1 g in sodium chloride 0.9 % 100 mL IVPB (0 g Intravenous Stopped 02/17/23 1750)  vancomycin (VANCOREADY) IVPB 750 mg/150 mL (750 mg Intravenous New Bag/Given 02/17/23 1836)  HYDROmorphone (DILAUDID) injection 1 mg (1 mg Intravenous Given 02/17/23 1916)    Mobility non-ambulatory     Focused Assessments   RUA IV that was an US guided    R Recommendations: See Admitting Provider Note  Report given to:   Additional Notes:  R BKA done recently

## 2023-02-17 NOTE — Progress Notes (Addendum)
Pharmacy Antibiotic Note  Deborah [name] is a 57 y.o. female for which pharmacy has been consulted for vancomycin dosing for  wound infection .  Patient with a history of ESRD on HD, HF, DM, gastroparesis, rt BKA. Recent hospitalization for stump infection with plan for vancomycin 1g qHD and ceftazidime 1g IV qHD and Flagyl PO x 2 weeks with plan to transition to Doxy, flagyl, and cefadroxil.  Last doses of vancomycin (12/5) and ceftazidime (12/8).  Patient presenting with abdominal pain.  Last HD per patient was while an inpatient here (12/10 0200) WBC pending; LA pending; T afebrile; HR 110; RR 21  Plan: Will obtain vancomycin level to determine if re-dosing is indicated Flagyl has not been ordered yet by MD Would plan to continue 1g ceftazidime qHD (1g given in ED 12/12)  Monitor WBC, fever, renal function, cultures De-escalate when able F/u Nephrology plan  ADDENDUM 02/17/23 5:40 PM: Vancomycin random back at 11 mcg/ml Will give 750 mg dose vancomycin now - further doses pending HD plan  Delmar Landau, PharmD, BCPS 02/17/2023 5:40 PM ED Clinical Pharmacist -  [phone]  ADDENDUM #2 02/17/23 9:54 PM: Ceftazidime per pharmacy consult added by hospitalist. No nephrology note yet but likely TTS HD  Ceftazidime 1g qHD Vancomycin as above Flagyl per MD Monitor for EOT (12/17) then plan for Doxy, flagyl, cefadroxil per notes  Delmar Landau, PharmD, BCPS 02/17/2023 9:57 PM ED Clinical Pharmacist -  [phone]      Temp (24hrs), Avg:98.2 F (36.8 C), Min:98.2 F (36.8 C), Max:98.2 F (36.8 C)  Recent Labs  Lab 02/11/23 1811 02/12/23 0429 02/14/23 0532 02/14/23 1334  WBC 15.7* 15.2* 16.1*  --   CREATININE  --   --  5.50*  --   VANCORANDOM  --   --   --  16    Estimated Creatinine Clearance: 15.1 mL/min (A) (by C-G formula based on SCr of 5.5 mg/dL (H)).    Allergies  Allergen Reactions   Isovue [Iopamidol] Anaphylaxis, [name] and  Other (See Comments)    11/28/17 Patient had seizure like activity and then 1 min code after 100 cc of isovue 300. Possible contrast allergy vs vasovagal episode  Cardiac Arrest   Neurontin [Gabapentin] [name] and Swelling   Nsaids Anaphylaxis and Other (See Comments)    Hx of stomach ulcers   Penicillins Itching, Palpitations and Other (See Comments)    Flushing (Red Skin) Laryngeal Edema   Reglan [Metoclopramide] Other (See Comments)    Tardive dyskinesia    Valium [Diazepam] [name]   Zestril [Lisinopril] Anaphylaxis and Swelling    Tongue and mouth swelling Laryngeal Edema   Tolectin [Tolmetin] Nausea And Vomiting, Nausea Only and Other (See Comments)    Irritates stomach ulcer   Asa [Aspirin] Other (See Comments)    Hx of stomach ulcer   Aspartame And Phenylalanine Hives   Bentyl [Dicyclomine] Other (See Comments)    Chest pain   Hibiclens [Chlorhexidine Gluconate] Other (See Comments)    Dermatitis    Flexeril [Cyclobenzaprine] Palpitations   Oxycontin [Oxycodone] Palpitations   Rifamycins Palpitations   Tylenol [Acetaminophen] Nausea And Vomiting, Nausea Only and Other (See Comments)    Irritates stomach ulcer Abdominal pain   Ultram [Tramadol] Nausea And Vomiting and Palpitations   Microbiology results: Pending  Thank you for allowing pharmacy to be a part of this patient's care.  Delmar Landau, PharmD, BCPS 02/17/2023 3:46 PM ED Clinical Pharmacist -  [phone]

## 2023-02-17 NOTE — Assessment & Plan Note (Signed)
Stable and compensated. 

## 2023-02-17 NOTE — Assessment & Plan Note (Signed)
-  Unclear etiology.  Pt with history of gastroparesis although pain is located in the mid abdomen distal to her epigastric region. LFTs and lipase within normal limits. CT abdomen pelvis without contrast was negative for acute findings and showed only known stable uterine fibroid.  She also only had mild tenderness on exam. -Patient has allergies to Reglan.  Will trial IV Benadryl x 1, IV PPI, PRN antiemetics -PRN IV dilaudid but will need to continue to space out and taper.  Patient has documented history of narcotic seeking behavior with history of chronic pain.

## 2023-02-17 NOTE — ED Provider Notes (Addendum)
[place] EMERGENCY DEPARTMENT AT [facility] Provider Note  [name]: [id number] Arrival date & time: 02/17/23 1105  Chief Complaint(s) Abdominal Pain  HPI Deborah [name] is a 57 y.o. female history of CHF, diabetes, gastroparesis, right below-knee amputation, recent hospitalization for wound infection presenting to the emergency department with abdominal pain.  She reports epigastric abdominal pain, nausea and vomiting.  She reports that feels a little worse than prior episodes of gastroparesis.  No radiation.  No fevers or chills.  She missed her dialysis today because of the pain and decided to come to the emergency department instead.  She also reports that she has been having stomach pain.  She denies any drainage.  She denies any fevers or chills.  She was discharged with plan for Vanco and ceftazidime at her dialysis center with oral Flagyl however she reports that she has not been taking any of the Flagyl and had no idea if she was post to be on antibiotics.  No fevers.   Past Medical History Past Medical History:  Diagnosis Date  . Acute back pain with sciatica, left   . Acute back pain with sciatica, right   . AKI (acute kidney injury) (HCC)   . Anemia, unspecified   . Cancer (HCC)   . Carcinoid tumor of duodenum   . Chest pain with normal coronary angiography 2019  . Chronic kidney disease, stage 3b (HCC)   . Chronic pain   . Chronic systolic CHF (congestive heart failure) (HCC)   . Diabetes mellitus   . DKA (diabetic ketoacidosis) (HCC)   . Drug-seeking behavior    21 hospitalizations and 14 CT a/p in 2 years for N/V and abdominal pain, demanding only IV dilaudid  . Elevated troponin    chronic  . Esophageal reflux   . Fibromyalgia   . Gastric ulcer   . Gastroparesis   . Gout   . Hyperlipidemia   . Hypertension   . Hypokalemia   . Hypomagnesemia   . Lumbosacral stenosis   . LVH (left ventricular hypertrophy)   . Morbid obesity (HCC)   . NICM  (nonischemic cardiomyopathy) (HCC)   . PAF (paroxysmal atrial fibrillation) (HCC)   . Stroke (HCC) 02/2011  . Thrombocytosis   . Vitamin B12 deficiency anemia    Patient Active Problem List   Diagnosis Date Noted  . Right below-knee amputee (HCC) 01/31/2023  . Dehiscence of amputation stump of right lower extremity (HCC) 01/29/2023  . Tardive dyskinesia 01/06/2023  . Peutz-Jeghers polyps of small bowel (HCC) 12/24/2022  . Gastric AVM 12/23/2022  . Acute osteomyelitis of right calcaneus (HCC) 12/21/2022  . Acute on chronic anemia 12/20/2022  . Symptomatic anemia 12/14/2022  . Pressure injury of skin 11/30/2022  . Non-pressure chronic ulcer of right heel and midfoot limited to breakdown of skin (HCC) 11/24/2022  . Sepsis (HCC) 11/23/2022  . Elevated troponin 09/09/2022  . Nausea & vomiting 09/09/2022  . End stage renal disease on dialysis (HCC) 09/09/2022  . Chronic a-fib (HCC) 09/09/2022  . Atrial fibrillation with RVR (HCC)-resolved 09/09/2022  . Peripheral neuropathy 09/09/2022  . (HFpEF) heart failure with preserved ejection fraction (HCC) 09/09/2022  . Hypotension 09/09/2022  . CHF (congestive heart failure) (HCC) 07/13/2022  . HCAP (healthcare-associated pneumonia) 06/19/2022  . Acute encephalopathy 05/29/2022  . Hyponatremia 05/29/2022  . Chronic anemia 05/22/2022  . Hyperosmolar hyperglycemic state (HHS) (HCC) 05/11/2022  . Hyperosmolar non-ketotic state due to type 2 diabetes mellitus (HCC) 05/11/2022  . Leukocytosis 05/11/2022  .  Edema of left upper extremity 03/21/2022  . Tachycardia 03/18/2022  . Atypical chest pain 09/10/2021  . Acute pyelonephritis   . PAF (paroxysmal atrial fibrillation) (HCC)   . Chronic pain 09/04/2019  . Malignant carcinoid tumor of duodenum (HCC)   . NASH (nonalcoholic steatohepatitis) 06/05/2019  . Chronic diarrhea   . Restrictive lung disease secondary to obesity   . History of gastric ulcer   . Fibroid uterus 02/23/2019  . Abdominal  pain 07/17/2018  . Anxiety 11/29/2017  . Spinal stenosis, lumbar region with neurogenic claudication 08/03/2017  . GERD (gastroesophageal reflux disease) 03/19/2017  . Depression 03/19/2017  . Morbid obesity (HCC)   . Normocytic anemia 08/16/2016  . Diabetic gastroparesis (HCC) 08/16/2016  . Gout 06/05/2016  . Chronic hypokalemia 09/26/2015  . Hypomagnesemia and hypokalemia 09/26/2015  . Insulin dependent type 2 diabetes mellitus (HCC) 05/25/2015  . Essential hypertension 09/28/2013   Home Medication(s) Prior to Admission medications   Medication Sig Start Date End Date Taking? Authorizing Provider  albuterol (PROVENTIL) (2.5 MG/3ML) 0.083% nebulizer solution Take 3 mLs (2.5 mg total) by nebulization every 6 (six) hours as needed for wheezing or shortness of breath. 04/06/19   Bing Neighbors, NP  allopurinol (ZYLOPRIM) 100 MG tablet Take 1 tablet (100 mg total) by mouth 2 (two) times daily. 11/30/22   Azucena Fallen, MD  Amino Acids-Protein Hydrolys (FEEDING SUPPLEMENT, PRO-STAT SUGAR FREE 64,) LIQD Take 30 mLs by mouth 2 (two) times daily between meals.    [provider]  ascorbic acid (VITAMIN C) 500 MG tablet Take 500 mg by mouth daily.    [provider]  atorvastatin (LIPITOR) 10 MG tablet Take 10 mg by mouth every evening. 12/03/21   [provider]  busPIRone (BUSPAR) 5 MG tablet Take 5 mg by mouth 3 (three) times daily.    [provider]  cefadroxil (DURICEF) 500 MG capsule Take 1 capsule (500 mg total) by mouth daily for 14 days. 02/23/23   Leroy Sea, MD  cefTAZidime 0.5 g in dextrose 5 % 50 mL Inject 0.5 g into the vein daily. 02/15/23   Leroy Sea, MD  cetirizine (ZYRTEC) 10 MG tablet Take 10 mg by mouth daily. 08/05/22   [provider]  colchicine 0.6 MG tablet Take 0.5 tablets (0.3 mg total) by mouth 2 (two) times a week. Patient not taking: Reported on 01/29/2023 12/02/22   Azucena Fallen, MD   doxycycline (VIBRA-TABS) 100 MG tablet Take 1 tablet (100 mg total) by mouth 2 (two) times daily. 02/23/23   Leroy Sea, MD  famotidine (PEPCID) 20 MG tablet Take 20 mg by mouth daily before breakfast.    [provider]  ferric citrate (AURYXIA) 1 GM 210 MG(Fe) tablet Take 2 tablets (420 mg total) by mouth 3 (three) times daily with meals. Patient taking differently: Take 210 mg by mouth 3 (three) times daily with meals. 11/30/22   Azucena Fallen, MD  fluticasone [facility]) 50 MCG/ACT nasal spray Place 2 sprays into both nostrils daily as needed for allergies or rhinitis. 12/19/18   Rai, Delene Ruffini, MD  folic acid (FOLVITE) 1 MG tablet Take 1 tablet (1 mg total) by mouth daily. 01/10/23   Danford, Earl Lites, MD  gabapentin (NEURONTIN) 100 MG capsule Take 1 capsule (100 mg total) by mouth 3 (three) times daily. 11/30/22   Azucena Fallen, MD  HYDROmorphone (DILAUDID) 4 MG tablet Take 1 tablet (4 mg total) by mouth See admin instructions.  Give 4 mg (1 tablet) three times daily for pain. Give first dose at 0600 on dialysis days (T-Th-Sat), otherwise give first dose at 0900. May give an additional tablet every 24 hours as needed for severe pain (do not administer within 3 hours of scheduled dose). 02/15/23   Leroy Sea, MD  hydrOXYzine (ATARAX) 25 MG tablet Take 1 tablet (25 mg total) by mouth 3 (three) times daily as needed for anxiety. Patient taking differently: Take 25 mg by mouth See admin instructions. Give 25 mg (1 tablet) every 6 hours as needed for anxiety, agitation for 14 days (starting 11/19), then every 8 hours as needed. 01/10/23   Danford, Earl Lites, MD  insulin glargine (SEMGLEE, YFGN,) 100 UNIT/ML Solostar Pen Inject 8 Units into the skin at bedtime.    [provider]  insulin lispro (HUMALOG) 100 UNIT/ML KwikPen Before each meal 3 times a day, 140-199 - 2 units, 200-250 - 6 units, 251-299 - 8 units,  300-349 - 12 units,  350 or above 14  units. Patient taking differently: Inject 0-14 Units into the skin See admin instructions. Inject 0-14 units per sliding scale before meals: < 70 notify MD 70-139 : 0 units 140-199 : 2 units 200-250 : 6 units 251-299 : 8 units 300-349 : 12 units 350-400 : 14 units > 401 notify MD. 06/04/22   Leroy Sea, MD  loperamide (IMODIUM) 2 MG capsule Take 1 capsule (2 mg total) by mouth as needed for diarrhea or loose stools. Patient taking differently: Take 2 mg by mouth daily as needed for diarrhea or loose stools. 01/10/23   Danford, Earl Lites, MD  methocarbamol (ROBAXIN) 500 MG tablet Take 1 tablet (500 mg total) by mouth every 6 (six) hours as needed for muscle spasms. 01/10/23   Danford, Earl Lites, MD  metoprolol tartrate (LOPRESSOR) 50 MG tablet Take 1 tablet (50 mg total) by mouth 2 (two) times daily. 01/10/23   Danford, Earl Lites, MD  metroNIDAZOLE (FLAGYL) 500 MG tablet Take 1 tablet (500 mg total) by mouth every 12 (twelve) hours. 02/15/23   Leroy Sea, MD  mirtazapine (REMERON) 15 MG tablet Take 15 mg by mouth at bedtime. 10/18/22   [provider]  Multiple Vitamins-Minerals (SENIOR MULTIVITAMIN PLUS PO) Take 1 tablet by mouth daily.    [provider]  nutrition supplement, JUVEN, (JUVEN) PACK Take 1 packet by mouth 2 (two) times daily between meals. 01/10/23   Danford, Earl Lites, MD  Nutritional Supplement LIQD Take 120-237 mLs by mouth See admin instructions. House supplement. Give 120 mL in the morning at 0900 and 237 mL twice daily between meals (1000, 1400)    [provider]  Nutritional Supplements (,FEEDING SUPPLEMENT, PROSOURCE PLUS) liquid Take 30 mLs by mouth 2 (two) times daily between meals. 01/10/23   Danford, Earl Lites, MD  pantoprazole (PROTONIX) 40 MG tablet Take 40 mg by mouth 2 (two) times daily.    [provider]  vancomycin (VANCOCIN) 1-5 GM/200ML-% SOLN Inject 200 mLs (1,000 mg total) into the vein every  Monday, Wednesday, and Friday with hemodialysis. 02/15/23   Leroy Sea, MD  zinc sulfate 220 (50 Zn) MG capsule Take 1 capsule (220 mg total) by mouth daily. 01/10/23   Danford, Earl Lites, MD  Past Surgical History Past Surgical History:  Procedure Laterality Date  . ABDOMINAL AORTOGRAM W/LOWER EXTREMITY N/A 11/29/2022   Procedure: ABDOMINAL AORTOGRAM W/LOWER EXTREMITY;  Surgeon: Daria Pastures, MD;  Location: [facility] INVASIVE CV LAB;  Service: Cardiovascular;  Laterality: N/A;  . AMPUTATION Right 12/29/2022   Procedure: RIGHT BELOW KNEE AMPUTATION;  Surgeon: Nadara Mustard, MD;  Location: [facility] OR;  Service: Orthopedics;  Laterality: Right;  . AV FISTULA PLACEMENT Left 06/30/2022   Procedure: LEFT BRACHIOCEPHALIC ARTERIOVENOUS (AV) FISTULA CREATION;  Surgeon: Cephus Shelling, MD;  Location: [facility] OR;  Service: Vascular;  Laterality: Left;  . BIOPSY  07/27/2019   Procedure: BIOPSY;  Surgeon: Vida Rigger, MD;  Location: [facility] ENDOSCOPY;  Service: Endoscopy;;  . BIOPSY  07/30/2019   Procedure: BIOPSY;  Surgeon: Kathi Der, MD;  Location: [facility] ENDOSCOPY;  Service: Gastroenterology;;  . CATARACT EXTRACTION  01/2014  . CHOLECYSTECTOMY    . COLONOSCOPY WITH PROPOFOL N/A 07/30/2019   Procedure: COLONOSCOPY WITH PROPOFOL;  Surgeon: Kathi Der, MD;  Location: [facility]COPY;  Service: Gastroenterology;  Laterality: N/A;  . ESOPHAGOGASTRODUODENOSCOPY N/A 07/27/2019   Procedure: ESOPHAGOGASTRODUODENOSCOPY (EGD);  Surgeon: Vida Rigger, MD;  Location: [facility];  Service: Endoscopy;  Laterality: N/A;  . ESOPHAGOGASTRODUODENOSCOPY N/A 07/26/2020   Procedure: ESOPHAGOGASTRODUODENOSCOPY (EGD);  Surgeon: Willis Modena, MD;  Location: [facility];  Service: Endoscopy;  Laterality: N/A;  . ESOPHAGOGASTRODUODENOSCOPY (EGD) WITH PROPOFOL N/A 08/02/2019   Procedure:  ESOPHAGOGASTRODUODENOSCOPY (EGD) WITH PROPOFOL;  Surgeon: Kathi Der, MD;  Location: [facility];  Service: Gastroenterology;  Laterality: N/A;  . ESOPHAGOGASTRODUODENOSCOPY (EGD) WITH PROPOFOL N/A 12/23/2022   Procedure: ESOPHAGOGASTRODUODENOSCOPY (EGD) WITH PROPOFOL;  Surgeon: Shellia Cleverly, DO;  Location: [facility]COPY;  Service: Gastroenterology;  Laterality: N/A;  . FISTULA SUPERFICIALIZATION Left 12/31/2022   Procedure: LEFT ARM FISTULA TRANSPOSITION;  Surgeon: Maeola Harman, MD;  Location: [facility] OR;  Service: Vascular;  Laterality: Left;  . GIVENS CAPSULE STUDY N/A 12/23/2022   Procedure: GIVENS CAPSULE STUDY;  Surgeon: Shellia Cleverly, DO;  Location: [facility] ENDOSCOPY;  Service: Gastroenterology;  Laterality: N/A;  . HEMOSTASIS CLIP PLACEMENT  08/02/2019   Procedure: HEMOSTASIS CLIP PLACEMENT;  Surgeon: Kathi Der, MD;  Location: [facility];  Service: Gastroenterology;;  . HOT HEMOSTASIS N/A 12/23/2022   Procedure: HOT HEMOSTASIS (ARGON PLASMA COAGULATION/BICAP);  Surgeon: Shellia Cleverly, DO;  Location: [facility] ENDOSCOPY;  Service: Gastroenterology;  Laterality: N/A;  . IR FLUORO GUIDE CV LINE RIGHT  06/24/2022  . IR US GUIDE VASC ACCESS RIGHT  06/24/2022  . POLYPECTOMY  07/30/2019   Procedure: POLYPECTOMY;  Surgeon: Kathi Der, MD;  Location: [facility];  Service: Gastroenterology;;  . POLYPECTOMY  08/02/2019   Procedure: POLYPECTOMY;  Surgeon: Kathi Der, MD;  Location: [facility];  Service: Gastroenterology;;  . STUMP REVISION Right 02/02/2023   Procedure: REVISION RIGHT BELOW KNEE AMPUTATION;  Surgeon: Nadara Mustard, MD;  Location: [facility] OR;  Service: Orthopedics;  Laterality: Right;   Family History Family History  Problem Relation Age of Onset  . Diabetes Mother   . Diabetes Father   . Heart disease Father   . Diabetes Sister   . Congestive Heart Failure Sister 31  . Diabetes Brother     Social History Social History   Tobacco Use   . Smoking status: Never  . Smokeless tobacco: Never  Vaping Use  . Vaping status: Never Used  Substance Use Topics  . Alcohol use: No  . Drug use: No   Allergies Isovue [iopamidol], Neurontin [gabapentin], Nsaids, Penicillins,  Reglan [metoclopramide], Valium [diazepam], Zestril [lisinopril], Tolectin [tolmetin], Asa [aspirin], Aspartame and phenylalanine, Bentyl [dicyclomine], Hibiclens [chlorhexidine gluconate], Flexeril [cyclobenzaprine], Oxycontin [oxycodone], Rifamycins, Tylenol [acetaminophen], and Ultram [tramadol]  Review of Systems Review of Systems  All other systems reviewed and are negative.   Physical Exam Vital Signs  I have reviewed the triage vital signs BP 125/89   Pulse (!) 110   Temp 98.2 F (36.8 C)   Resp (!) 21   LMP 10/10/2012   SpO2 100%  Physical Exam Vitals and nursing note reviewed.  Constitutional:      General: She is not in acute distress.    Appearance: She is well-developed.     Comments: Tardive dyskinesia movements present  HENT:     Head: Normocephalic and atraumatic.     Mouth/Throat:     Mouth: Mucous membranes are moist.  Eyes:     Pupils: Pupils are equal, round, and reactive to light.  Cardiovascular:     Rate and Rhythm: Normal rate and regular rhythm.     Heart sounds: No murmur heard. Pulmonary:     Effort: Pulmonary effort is normal. No respiratory distress.     Breath sounds: Normal breath sounds.  Abdominal:     General: Abdomen is flat.     Palpations: Abdomen is soft.     Tenderness: There is abdominal tenderness in the epigastric area.  Musculoskeletal:        General: No tenderness.     Right lower leg: No edema.     Left lower leg: No edema.  Skin:    General: Skin is warm and dry.     Comments: Right BKA stump with granulation tissue crusting in place at staple line.  No purulent drainage  Neurological:     General: No focal deficit present.     Mental Status: She is alert. Mental status is at baseline.   Psychiatric:        Mood and Affect: Mood normal.        Behavior: Behavior normal.     ED Results and Treatments Labs (all labs ordered are listed, but only abnormal results are displayed) Labs Reviewed  LIPASE, BLOOD  COMPREHENSIVE METABOLIC PANEL  CBC  URINALYSIS, ROUTINE W REFLEX MICROSCOPIC                                                                                                                          Radiology No results found.  Pertinent labs & imaging results that were available during my care of the patient were reviewed by me and considered in my medical decision making (see MDM for details).  Medications Ordered in ED Medications  sodium chloride 0.9 % bolus 500 mL (has no administration in time range)  HYDROmorphone (DILAUDID) injection 1 mg (1 mg Intravenous Given 02/17/23 1420)  ondansetron (ZOFRAN) injection 4 mg (4 mg Intravenous Given 02/17/23 1423)  Procedures Procedures  (including critical care time)  Medical Decision Making / ED Course   MDM:  57 year old female presenting to the emergency department with epigastric pain.  Patient overall well-appearing.  She is chronically ill.  Her stump is warm to the touch but not obviously infected.  There is no drainage.  She does have abdominal tenderness.  Differential includes pancreatitis, chronic gastroparesis, volvulus, small bowel obstruction, abscess, perforation.  Differential also includes postoperative knee infection, abscess, osteomyelitis.  Will obtain CT abdomen to evaluate for intra-abdominal process as well as CT right knee to evaluate for complication of recent surgical procedure.  Patient is tachycardic appears uncomfortable.  Will give small fluid bolus.  Will reassess.  Labs pending at this time.  Clinical Course as of 02/17/23 1521  Thu Feb 17, 2023   1521 Signed out to Dr. Karene Fry pending CT scans, labs [WS]    Clinical Course User Index [WS] Suezanne Jacquet Jerilee Field, MD     Additional history obtained: -Additional history obtained from ems -External records from outside source obtained and reviewed including: Chart review including previous notes, labs, imaging, consultation notes including prior D/c summary    Lab Tests: -I ordered, reviewed, and interpreted labs.   The pertinent results include:   Labs Reviewed  LIPASE, BLOOD  COMPREHENSIVE METABOLIC PANEL  CBC  URINALYSIS, ROUTINE W REFLEX MICROSCOPIC    Notable for pending   EKG   EKG Interpretation Date/Time:  Thursday February 17 2023 11:24:17 EST Ventricular Rate:  124 PR Interval:  162 QRS Duration:  82 QT Interval:  292 QTC Calculation: 419 R Axis:   -60  Text Interpretation: Sinus tachycardia with occasional Premature ventricular complexes Left anterior fascicular block Cannot rule out Anterior infarct , age undetermined Abnormal ECG Confirmed by Alvino Blood ([id number]) on 02/17/2023 12:42:02 PM          Medicines ordered and prescription drug management: Meds ordered this encounter  Medications  . HYDROmorphone (DILAUDID) injection 1 mg  . ondansetron (ZOFRAN) injection 4 mg  . sodium chloride 0.9 % bolus 500 mL    -I have reviewed the patients home medicines and have made adjustments as needed  Social Determinants of Health:  Diagnosis or treatment significantly limited by social determinants of health: obesity   Reevaluation: After the interventions noted above, I reevaluated the patient and found that their symptoms have improved  Co morbidities that complicate the patient evaluation . Past Medical History:  Diagnosis Date  . Acute back pain with sciatica, left   . Acute back pain with sciatica, right   . AKI (acute kidney injury) (HCC)   . Anemia, unspecified   . Cancer (HCC)   . Carcinoid tumor of duodenum   . Chest pain with  normal coronary angiography 2019  . Chronic kidney disease, stage 3b (HCC)   . Chronic pain   . Chronic systolic CHF (congestive heart failure) (HCC)   . Diabetes mellitus   . DKA (diabetic ketoacidosis) (HCC)   . Drug-seeking behavior    21 hospitalizations and 14 CT a/p in 2 years for N/V and abdominal pain, demanding only IV dilaudid  . Elevated troponin    chronic  . Esophageal reflux   . Fibromyalgia   . Gastric ulcer   . Gastroparesis   . Gout   . Hyperlipidemia   . Hypertension   . Hypokalemia   . Hypomagnesemia   . Lumbosacral stenosis   . LVH (left ventricular hypertrophy)   . Morbid obesity (HCC)   .  NICM (nonischemic cardiomyopathy) (HCC)   . PAF (paroxysmal atrial fibrillation) (HCC)   . Stroke (HCC) 02/2011  . Thrombocytosis   . Vitamin B12 deficiency anemia       Dispostion: Disposition decision including need for hospitalization was considered, and patient disposition pending at time of sign out.    Final Clinical Impression(s) / ED Diagnoses Final diagnoses:  Epigastric pain  Stump pain (HCC)     This chart was dictated using voice recognition software.  Despite best efforts to proofread,  errors can occur which can change the documentation meaning.    Lonell Grandchild, MD 02/17/23 1521    Lonell Grandchild, MD 02/17/23 [phone]

## 2023-02-18 ENCOUNTER — Other Ambulatory Visit: Payer: Self-pay | Admitting: Physical Medicine and Rehabilitation

## 2023-02-18 ENCOUNTER — Observation Stay (HOSPITAL_BASED_OUTPATIENT_CLINIC_OR_DEPARTMENT_OTHER): Payer: 59

## 2023-02-18 ENCOUNTER — Observation Stay (HOSPITAL_COMMUNITY): Payer: 59

## 2023-02-18 DIAGNOSIS — R1013 Epigastric pain: Secondary | ICD-10-CM

## 2023-02-18 DIAGNOSIS — R6510 Systemic inflammatory response syndrome (SIRS) of non-infectious origin without acute organ dysfunction: Secondary | ICD-10-CM

## 2023-02-18 DIAGNOSIS — T8789 Other complications of amputation stump: Secondary | ICD-10-CM

## 2023-02-18 DIAGNOSIS — M79661 Pain in right lower leg: Secondary | ICD-10-CM | POA: Diagnosis not present

## 2023-02-18 DIAGNOSIS — M79609 Pain in unspecified limb: Secondary | ICD-10-CM

## 2023-02-18 DIAGNOSIS — I5032 Chronic diastolic (congestive) heart failure: Secondary | ICD-10-CM | POA: Diagnosis not present

## 2023-02-18 LAB — CBC
HCT: 25.7 % — ABNORMAL LOW (ref 36.0–46.0)
Hemoglobin: 7.8 g/dL — ABNORMAL LOW (ref 12.0–15.0)
MCH: 29.3 pg (ref 26.0–34.0)
MCHC: 30.4 g/dL (ref 30.0–36.0)
MCV: 96.6 fL (ref 80.0–100.0)
Platelets: 436 10*3/uL — ABNORMAL HIGH (ref 150–400)
RBC: 2.66 MIL/uL — ABNORMAL LOW (ref 3.87–5.11)
RDW: 16.7 % — ABNORMAL HIGH (ref 11.5–15.5)
WBC: 15.2 10*3/uL — ABNORMAL HIGH (ref 4.0–10.5)
nRBC: 0 % (ref 0.0–0.2)

## 2023-02-18 LAB — BASIC METABOLIC PANEL
Anion gap: 15 (ref 5–15)
BUN: 31 mg/dL — ABNORMAL HIGH (ref 6–20)
CO2: 20 mmol/L — ABNORMAL LOW (ref 22–32)
Calcium: 8.2 mg/dL — ABNORMAL LOW (ref 8.9–10.3)
Chloride: 108 mmol/L (ref 98–111)
Creatinine, Ser: 5.92 mg/dL — ABNORMAL HIGH (ref 0.44–1.00)
GFR, Estimated: 8 mL/min — ABNORMAL LOW (ref >60)
Glucose, Bld: 132 mg/dL — ABNORMAL HIGH (ref 70–99)
Potassium: 3.6 mmol/L (ref 3.5–5.1)
Sodium: 143 mmol/L (ref 135–145)

## 2023-02-18 MED ORDER — METHOCARBAMOL 500 MG PO TABS
500.0000 mg | ORAL_TABLET | Freq: Four times a day (QID) | ORAL | Status: DC | PRN
  Administered 2023-02-18: 500 mg via ORAL
  Filled 2023-02-18: qty 1

## 2023-02-18 MED ORDER — ENOXAPARIN SODIUM 30 MG/0.3ML IJ SOSY: 30.0000 mg | PREFILLED_SYRINGE | INTRAMUSCULAR | Status: DC

## 2023-02-18 MED ORDER — HYDROMORPHONE HCL 2 MG PO TABS
4.0000 mg | ORAL_TABLET | Freq: Three times a day (TID) | ORAL | Status: DC
  Administered 2023-02-18: 4 mg via ORAL
  Filled 2023-02-18: qty 2

## 2023-02-18 MED ORDER — FENTANYL CITRATE PF 50 MCG/ML IJ SOSY
25.0000 ug | PREFILLED_SYRINGE | INTRAMUSCULAR | Status: DC | PRN
  Administered 2023-02-18: 50 ug via INTRAVENOUS
  Filled 2023-02-18: qty 1

## 2023-02-18 MED ORDER — HYDROMORPHONE HCL 1 MG/ML IJ SOLN
0.5000 mg | INTRAMUSCULAR | Status: DC | PRN
  Administered 2023-02-18 (×3): 1 mg via INTRAVENOUS
  Filled 2023-02-18 (×3): qty 1

## 2023-02-18 MED ORDER — GABAPENTIN 100 MG PO CAPS
100.0000 mg | ORAL_CAPSULE | Freq: Three times a day (TID) | ORAL | Status: DC
  Filled 2023-02-18: qty 1

## 2023-02-18 MED ORDER — HYDROMORPHONE HCL 2 MG PO TABS
4.0000 mg | ORAL_TABLET | Freq: Every day | ORAL | Status: DC | PRN
  Administered 2023-02-18: 4 mg via ORAL
  Filled 2023-02-18: qty 2

## 2023-02-18 NOTE — Progress Notes (Signed)
Patient ID: Deborah [name], female   DOB: [birth year], 57 y.o.   MRN: [id number] Patient is seen in follow-up for right transtibial amputation.  Patient presents with episodes of sharp pain in the residual limb on the right.  Patient denies any fever or chills or systemic symptoms.  Examination the dressing that she removed is clean and dry.  The surgical incision is clean and dry.  With palpation I am unable to express any fluid.  Palpation is not tender to palpation.  There is no cellulitis no wound dehiscence or ischemic changes.  Patient's pain most likely neuropathic or phantom pain.  I told her this pain would take several months to improve.  Patient could discharge on oral antibiotic if you like.  She does not need IV antibiotics.  I will follow-up in the office.

## 2023-02-18 NOTE — Progress Notes (Signed)
Advised by CSW of pt's admission. Noted d/c order for today. Contacted [facility] to advise clinic that pt is obs level of care and pt being dc to home today. Clinic advised pt should resume care tomorrow.   Olivia Canter Renal Navigator [phone]

## 2023-02-18 NOTE — Progress Notes (Signed)
[facility] [phone] [facility] Liaison Note:   Deborah [name] has an active palliative referral for this patient. Hospital liaison will follow for discharge disposition.    Please call with any hospice our outpatient palliative care related questions.   Thank you for the opportunity to participate in this patient's care.   Henderson Newcomer, LPN [facility] Liaison [phone]

## 2023-02-18 NOTE — Discharge Summary (Signed)
PATIENT DETAILS Name: Deborah [name] Age: 57 y.o. Sex: female Date of Birth: 07-15-1965 MRN: [id number]. Admitting Physician: Anselm Jungling, DO PCP:Pcp, No  Admit Date: 02/17/2023 Discharge date: 02/18/2023  Recommendations for Outpatient Follow-up:  Follow up with PCP in 1-2 weeks Please obtain CMP/CBC in one week Please follow up with  infectious disease, orthopedics   Admitted From:  Home  Disposition: Home health   Discharge Condition: good  CODE STATUS:   Code Status: Full Code   Diet recommendation:  Diet Order             Diet - low sodium heart healthy           Diet renal with fluid restriction Fluid restriction: 1500 mL Fluid; Room service appropriate? Yes; Fluid consistency: Thin  Diet effective now                    Brief Summary: 57 year old with history of ESRD recently switched to HD TTS, chronic left heel ulcer on antibiotics, PAF not on anticoagulation, history of GI bleeding, chronic HFpEF, chronic pain syndrome-on narcotics-who was recently hospitalized from 11/23-12/10 for sepsis related to right BKA stump dehiscence-requiring BKA revision-brought to the ED on 12/12 for evaluation of abdominal pain and right BKA stump pain.  Significant studies: 12/12>> CT abdomen/pelvis: No acute findings 12/12>> CT right knee: Postsurgical changes with possible fluid collection-seroma versus abscess. 12/12>> blood culture: No growth  Brief Hospital Course: Abdominal pain No obvious etiology-much better today-benign abdominal exam.  CT abdomen without any acute issues.  Tolerating diet per nursing staff without any nausea/vomiting. Already on narcotics-continue supportive care and monitoring.  Right BKA stump pain CT right knee showed possible fluid collection-more consistent with postsurgical seroma.  Evaluated by orthopedics-no evidence of infection-deemed not to require any further workup at this point.  Left heel cellulitis Continue  antibiotics as recommended by ID-Vanco/Fortaz with HD along with oral Flagyl-EOT 12/7-then transition to Doxy/Flagyl/cefadroxil until follow-up with ID clinic.  Recent hospitalization for sepsis related to BKA stump infection with dehiscence-s/p BKA revision  ESRD on HD TTS Resume outpatient dialysis schedule. Lites stable-no indications for urgent HD.  Normocytic anemia Hb on 12/12 likely a lab error-current Hb is close to her prior baseline No evidence of hematochezia/melena Continue Aranesp/iron per nephrology service with further outpatient HD.  PAF Continue beta-blocker Not a candidate for anticoagulation.  Chronic PAF Euvolemic Volume removal with HD  History of GI bleeding October 2024 Thought to be due to AVMs No longer on anticoagulation Hb close to baseline  History of NASH Stable/supportive care   Chronic pain syndrome/chronic back pain/neurogenic claudication Resume usual dosing of Dilaudid.  Has narcotic seeking behavior-PCP to consider slowly tapering down narcotic regimen.   History of malignant duodenal carcinoid-s/p resection Stable for continued outpatient follow-up with oncology/PCP   History of tardive dyskinesia Supportive care   Anxiety/mood disorder Resume Remeron/BuSpar As needed Atarax  Obesity: Estimated body mass index is 44.51 kg/m as calculated from the following:   Height as of 02/02/23: 5\' 6"  (1.676 m).   Weight as of 02/05/23: 125.1 kg.    Discharge Diagnoses:  Principal Problem:   Abdominal pain Active Problems:   PAF (paroxysmal atrial fibrillation) (HCC)   Morbid obesity (HCC)   Depression   Anxiety   End stage renal disease on dialysis (HCC)   (HFpEF) heart failure with preserved ejection fraction (HCC)   Right below-knee amputee (HCC)   Sinus tachycardia   Cellulitis of heel, left  Discharge Instructions:  Activity:  As tolerated with Full fall precautions use walker/cane & assistance as needed   Discharge  Instructions     Call MD for:  difficulty breathing, headache or visual disturbances   Complete by: As directed    Diet - low sodium heart healthy   Complete by: As directed    Discharge instructions   Complete by: As directed    Follow with Primary MD  1-2 weeks  Follow-up with hemodialysis clinic at your usual schedule.  Please get a complete blood count and chemistry panel checked by your Primary MD at your next visit, and again as instructed by your Primary MD.  Get Medicines reviewed and adjusted: Please take all your medications with you for your next visit with your Primary MD  Laboratory/radiological data: Please request your Primary MD to go over all hospital tests and procedure/radiological results at the follow up, please ask your Primary MD to get all Hospital records sent to his/her office.  In some cases, they will be blood work, cultures and biopsy results pending at the time of your discharge. Please request that your primary care M.D. follows up on these results.  Also Note the following: If you experience worsening of your admission symptoms, develop shortness of breath, life threatening emergency, suicidal or homicidal thoughts you must seek medical attention immediately by calling 911 or calling your MD immediately  if symptoms less severe.  You must read complete instructions/literature along with all the possible adverse reactions/side effects for all the Medicines you take and that have been prescribed to you. Take any new Medicines after you have completely understood and accpet all the possible adverse reactions/side effects.   Do not drive when taking Pain medications or sleeping medications (Benzodaizepines)  Do not take more than prescribed Pain, Sleep and Anxiety Medications. It is not advisable to combine anxiety,sleep and pain medications without talking with your primary care practitioner  Special Instructions: If you have smoked or chewed Tobacco  in  the last 2 yrs please stop smoking, stop any regular Alcohol  and or any Recreational drug use.  Wear Seat belts while driving.  Please note: You were cared for by a hospitalist during your hospital stay. Once you are discharged, your primary care physician will handle any further medical issues. Please note that NO REFILLS for any discharge medications will be authorized once you are discharged, as it is imperative that you return to your primary care physician (or establish a relationship with a primary care physician if you do not have one) for your post hospital discharge needs so that they can reassess your need for medications and monitor your lab values.   Increase activity slowly   Complete by: As directed    No wound care   Complete by: As directed       Allergies as of 02/18/2023       Reactions   Isovue [iopamidol] Anaphylaxis, [name], Other (See Comments)   11/28/17 Patient had seizure like activity and then 1 min code after 100 cc of isovue 300. Possible contrast allergy vs vasovagal episode Cardiac Arrest   Neurontin [gabapentin] [name], Swelling   Nsaids Anaphylaxis, Other (See Comments)   Hx of stomach ulcers   Penicillins Itching, Palpitations, Other (See Comments)   Flushing (Red Skin) Laryngeal Edema   Reglan [metoclopramide] Other (See Comments)   Tardive dyskinesia   Valium [diazepam] [name]   Zestril [lisinopril] Anaphylaxis, Swelling   Tongue  and mouth swelling Laryngeal Edema   Tolectin [tolmetin] Nausea And Vomiting, Nausea Only, Other (See Comments)   Irritates stomach ulcer   Asa [aspirin] Other (See Comments)   Hx of stomach ulcer   Aspartame And Phenylalanine Hives   Bentyl [dicyclomine] Other (See Comments)   Chest pain   Hibiclens [chlorhexidine Gluconate] Other (See Comments)   Dermatitis    Flexeril [cyclobenzaprine] Palpitations   Oxycontin [oxycodone] Palpitations   Rifamycins Palpitations   Tylenol  [acetaminophen] Nausea And Vomiting, Nausea Only, Other (See Comments)   Irritates stomach ulcer Abdominal pain   Ultram [tramadol] Nausea And Vomiting, Palpitations        Medication List     STOP taking these medications    pantoprazole 40 MG tablet Commonly known as: PROTONIX   zinc sulfate (50mg  elemental zinc) 220 (50 Zn) MG capsule       TAKE these medications    Nutritional Supplement Liqd Take 120-237 mLs by mouth See admin instructions. House supplement. Give 120 mL in the morning at 0900 and 237 mL twice daily between meals (1000, 1400)   (feeding supplement) PROSource Plus liquid Take 30 mLs by mouth 2 (two) times daily between meals.   nutrition supplement (JUVEN) Pack Take 1 packet by mouth 2 (two) times daily between meals.   albuterol (2.5 MG/3ML) 0.083% nebulizer solution Commonly known as: PROVENTIL Take 3 mLs (2.5 mg total) by nebulization every 6 (six) hours as needed for wheezing or shortness of breath.   allopurinol 100 MG tablet Commonly known as: ZYLOPRIM Take 1 tablet (100 mg total) by mouth 2 (two) times daily.   ascorbic acid 500 MG tablet Commonly known as: VITAMIN C Take 500 mg by mouth daily.   atorvastatin 10 MG tablet Commonly known as: LIPITOR Take 10 mg by mouth every evening.   busPIRone 5 MG tablet Commonly known as: BUSPAR Take 5 mg by mouth 3 (three) times daily.   cefadroxil 500 MG capsule Commonly known as: DURICEF Take 1 capsule (500 mg total) by mouth daily for 14 days. Start taking on: February 23, 2023   cefTAZidime 0.5 g in dextrose 5 % 50 mL Inject 0.5 g into the vein daily.   cetirizine 10 MG tablet Commonly known as: ZYRTEC Take 10 mg by mouth daily.   colchicine 0.6 MG tablet Take 0.5 tablets (0.3 mg total) by mouth 2 (two) times a week.   doxycycline 100 MG tablet Commonly known as: VIBRA-TABS Take 1 tablet (100 mg total) by mouth 2 (two) times daily. Start taking on: February 23, 2023    famotidine 20 MG tablet Commonly known as: PEPCID Take 20 mg by mouth daily before breakfast.   feeding supplement (PRO-STAT SUGAR FREE 64) Liqd Take 30 mLs by mouth 2 (two) times daily between meals.   ferric citrate 1 GM 210 MG(Fe) tablet Commonly known as: AURYXIA Take 2 tablets (420 mg total) by mouth 3 (three) times daily with meals. What changed: how much to take   fluticasone 50 MCG/ACT nasal spray Commonly known as: FLONASE Place 2 sprays into both nostrils daily as needed for allergies or rhinitis.   folic acid 1 MG tablet Commonly known as: FOLVITE Take 1 tablet (1 mg total) by mouth daily.   gabapentin 100 MG capsule Commonly known as: NEURONTIN Take 1 capsule (100 mg total) by mouth 3 (three) times daily.   HYDROmorphone 4 MG tablet Commonly known as: Dilaudid Take 1 tablet (4 mg total) by mouth See admin instructions. Give  4 mg (1 tablet) three times daily for pain. Give first dose at 0600 on dialysis days (T-Th-Sat), otherwise give first dose at 0900. May give an additional tablet every 24 hours as needed for severe pain (do not administer within 3 hours of scheduled dose).   hydrOXYzine 25 MG tablet Commonly known as: ATARAX Take 1 tablet (25 mg total) by mouth 3 (three) times daily as needed for anxiety. What changed:  when to take this additional instructions   insulin lispro 100 UNIT/ML KwikPen Commonly known as: HUMALOG Before each meal 3 times a day, 140-199 - 2 units, 200-250 - 6 units, 251-299 - 8 units,  300-349 - 12 units,  350 or above 14 units. What changed:  how much to take how to take this when to take this additional instructions   loperamide 2 MG capsule Commonly known as: IMODIUM Take 1 capsule (2 mg total) by mouth as needed for diarrhea or loose stools. What changed: when to take this   methocarbamol 500 MG tablet Commonly known as: ROBAXIN Take 1 tablet (500 mg total) by mouth every 6 (six) hours as needed for muscle spasms.    metoprolol tartrate 50 MG tablet Commonly known as: LOPRESSOR Take 1 tablet (50 mg total) by mouth 2 (two) times daily.   metroNIDAZOLE 500 MG tablet Commonly known as: FLAGYL Take 1 tablet (500 mg total) by mouth every 12 (twelve) hours.   mirtazapine 15 MG tablet Commonly known as: REMERON Take 15 mg by mouth at bedtime.   Semglee (yfgn) 100 UNIT/ML Pen Generic drug: insulin glargine Inject 8 Units into the skin at bedtime.   SENIOR MULTIVITAMIN PLUS PO Take 1 tablet by mouth daily.   vancomycin 1-5 GM/200ML-% Soln Commonly known as: VANCOCIN Inject 200 mLs (1,000 mg total) into the vein every Monday, Wednesday, and Friday with hemodialysis.        Follow-up Information     Primary care practitioner. Schedule an appointment as soon as possible for a visit in 1 week(s).          Hemodialysis clinic Follow up.   Why: Follow at your usual schedule        Nadara Mustard, MD Follow up in 1 week(s).   Specialty: Orthopedic Surgery Contact information: [address] [place] [address] [phone]                Allergies  Allergen Reactions   Isovue [Iopamidol] Anaphylaxis, [name] and Other (See Comments)    11/28/17 Patient had seizure like activity and then 1 min code after 100 cc of isovue 300. Possible contrast allergy vs vasovagal episode  Cardiac Arrest   Neurontin [Gabapentin] [name] and Swelling   Nsaids Anaphylaxis and Other (See Comments)    Hx of stomach ulcers   Penicillins Itching, Palpitations and Other (See Comments)    Flushing (Red Skin) Laryngeal Edema   Reglan [Metoclopramide] Other (See Comments)    Tardive dyskinesia    Valium [Diazepam] [name]   Zestril [Lisinopril] Anaphylaxis and Swelling    Tongue and mouth swelling Laryngeal Edema   Tolectin [Tolmetin] Nausea And Vomiting, Nausea Only and Other (See Comments)    Irritates stomach ulcer   Asa [Aspirin] Other (See Comments)     Hx of stomach ulcer   Aspartame And Phenylalanine Hives   Bentyl [Dicyclomine] Other (See Comments)    Chest pain   Hibiclens [Chlorhexidine Gluconate] Other (See Comments)    Dermatitis  Flexeril [Cyclobenzaprine] Palpitations   Oxycontin [Oxycodone] Palpitations   Rifamycins Palpitations   Tylenol [Acetaminophen] Nausea And Vomiting, Nausea Only and Other (See Comments)    Irritates stomach ulcer Abdominal pain   Ultram [Tramadol] Nausea And Vomiting and Palpitations     Other Procedures/Studies: CT ABDOMEN PELVIS WO CONTRAST Result Date: 02/17/2023 CLINICAL DATA:  Acute abdominal pain. Postop from lower extremity amputation 3 weeks ago. EXAM: CT ABDOMEN AND PELVIS WITHOUT CONTRAST TECHNIQUE: Multidetector CT imaging of the abdomen and pelvis was performed following the standard protocol without IV contrast. RADIATION DOSE REDUCTION: This exam was performed according to the departmental dose-optimization program which includes automated exposure control, adjustment of the mA and/or kV according to patient size and/or use of iterative reconstruction technique. COMPARISON:  Noncontrast CT on 01/27/2023 FINDINGS: Lower chest: No acute findings. Hepatobiliary: No mass visualized on this unenhanced exam. Prior cholecystectomy. No evidence of biliary obstruction. Pancreas: No mass or inflammatory process visualized on this unenhanced exam. Spleen:  Within normal limits in size. Adrenals/Urinary tract: No evidence of urolithiasis or hydronephrosis. Unremarkable unopacified urinary bladder. Stomach/Bowel: No evidence of obstruction, inflammatory process, or abnormal fluid collections. Vascular/Lymphatic: No pathologically enlarged lymph nodes identified. No evidence of abdominal aortic aneurysm. Reproductive: Uterus is unremarkable. A solid-appearing mass is seen in the left adnexal region measuring 4.6 cm in maximum diameter, which is contiguous with the left uterine fundus and unchanged since prior  studies dating back to 01/11/2020. This is consistent with a pedunculated fibroid. No other masses or free fluid identified. Other:  None. Musculoskeletal:  No suspicious bone lesions identified. IMPRESSION: No acute findings. Stable 4.6 cm pedunculated fibroid in left adnexal region. Electronically Signed   By: Danae Orleans M.D.   On: 02/17/2023 15:52   CT Knee Right Wo Contrast Result Date: 02/17/2023 CLINICAL DATA:  Right knee stump pain. Recent below-knee amputation. EXAM: CT OF THE RIGHT KNEE WITHOUT CONTRAST TECHNIQUE: Multidetector CT imaging of the right knee was performed according to the standard protocol. Multiplanar CT image reconstructions were also generated. RADIATION DOSE REDUCTION: This exam was performed according to the departmental dose-optimization program which includes automated exposure control, adjustment of the mA and/or kV according to patient size and/or use of iterative reconstruction technique. COMPARISON:  Right knee and lower leg x-rays dated May 12, 2022. FINDINGS: Bones/Joint/Cartilage Postsurgical changes from below-knee amputation with sharp bony margins of the tibia and fibula. No bony destruction. No fracture or dislocation. Advanced tricompartmental degenerative changes with multiple intra-articular bodies. 2.8 x 2.6 cm osteochondroma arising from medial proximal tibia. Trace knee joint effusion. Ligaments Ligaments are suboptimally evaluated by CT. Muscles and Tendons Postsurgical changes. Soft tissue Mild soft tissue swelling of the amputation stump with 2.5 x 6.6 x 3.3 cm partially organized simple fluid collection overlying the superficial fascia anteromedially (series 4, image 152). No soft tissue mass. IMPRESSION: 1. Postsurgical changes from below-knee amputation with 2.5 x 6.6 x 3.3 cm partially organized simple fluid collection overlying the superficial fascia anteromedially. Favor postoperative seroma over abscess in the absence of any open wound or purulent  drainage. 2. No CT evidence of osteomyelitis. 3. 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 4. 2.8 cm osteochondroma arising from medial proximal tibia. Electronically Signed   By: Obie Dredge M.D.   On: 02/17/2023 15:47   MR ANKLE LEFT WO CONTRAST Result Date: 02/11/2023 CLINICAL DATA:  Chronic ankle pain, heel wound EXAM: MRI OF THE LEFT ANKLE WITHOUT CONTRAST TECHNIQUE: Multiplanar, multisequence MR imaging of the ankle was performed.  No intravenous contrast was administered. COMPARISON:  None Available. FINDINGS: Despite efforts by the technologist and patient, motion artifact is present on today's exam and could not be eliminated. This reduces exam sensitivity and specificity. TENDONS Peroneal: Common peroneus tendon sheath tenosynovitis. Posteromedial: Distal tibialis posterior tendinopathy. Suspected small accessory navicular although less appreciable than I would expect on the T1 weighted sequence. Mild flexor hallucis longus tenosynovitis just proximal to the knot of Henry. Anterior: Unremarkable Achilles: Unremarkable Plantar Fascia: Abnormal thickening of the medial band compatible with plantar fasciitis. LIGAMENTS Lateral: Grossly intact, obscured by motion artifact. Medial: Obscured by motion artifact, integrity of the deltoid ligament is difficult to assess. CARTILAGE Ankle Joint: Grossly unremarkable. Subtalar Joints/Sinus Tarsi: Moderate degenerative spurring. Bones: Substantial degenerative spurring at the Chopart joint and in the midfoot. No findings of osteomyelitis. Other: Subcutaneous edema anterolaterally along the ankle and tracking into the dorsum of the foot. IMPRESSION: 1. No findings of osteomyelitis. 2. Common peroneus tendon sheath tenosynovitis. 3. Distal tibialis posterior tendinopathy. 4. Mild flexor hallucis longus tenosynovitis. 5. Plantar fasciitis. 6. Subcutaneous edema anterolaterally along the ankle and tracking into the dorsum of the  foot. 7. Degenerative spurring at the Chopart joint and in the midfoot. Electronically Signed   By: Gaylyn Rong M.D.   On: 02/11/2023 13:56   MR FOOT LEFT WO CONTRAST Result Date: 02/10/2023 CLINICAL DATA:  Chronic foot pain EXAM: MRI OF THE LEFT FOOT WITHOUT CONTRAST TECHNIQUE: Multiplanar, multisequence MR imaging of the left forefoot was performed. No intravenous contrast was administered. COMPARISON:  02/07/2023 FINDINGS: Despite efforts by the technologist and patient, motion artifact is present on today's exam and could not be eliminated. This reduces exam sensitivity and specificity. Bones/Joint/Cartilage No marrow edema in the forefoot to suggest osteomyelitis. Spurring of the first metatarsal head noted with a 4 mm degenerative subcortical cystic lesion along the central articular surface of the first metatarsal head on image 9 series 6. First digit sesamoids unremarkable. No definite erosion observed. Mild degenerative findings including loss of articular space and spurring at the Lisfranc joint. Ligaments The Lisfranc ligament appears intact. No definite turf toe or plantar plate injury identified. Muscles and Tendons Moderate regional muscular atrophy. Soft tissues Subcutaneous edema along the dorsum of the foot and extending into the toes. There is lateral subcutaneous edema at the level of the fifth MTP joint as well as subcutaneous edema along the ball of the foot also extending into the toes. IMPRESSION: 1. No marrow edema in the forefoot to suggest osteomyelitis. 2. Subcutaneous edema along the dorsum of the foot and extending into the toes, and also along the ball of the foot. 3. Moderate regional muscular atrophy. 4. Mild degenerative findings at the Lisfranc joint and first metatarsophalangeal joint. Electronically Signed   By: Gaylyn Rong M.D.   On: 02/10/2023 18:59   DG Chest Port 1 View Result Date: 01/31/2023 CLINICAL DATA:  [name]. EXAM: PORTABLE CHEST 1  VIEW COMPARISON:  November 23, 2022. FINDINGS: Stable cardiomediastinal silhouette. Right internal jugular dialysis catheter is unchanged. Both lungs are clear. The visualized skeletal structures are unremarkable. IMPRESSION: No active disease. Electronically Signed   By: Lupita Raider M.D.   On: 01/31/2023 16:46   CT ABDOMEN PELVIS WO CONTRAST Result Date: 01/27/2023 CLINICAL DATA:  Acute generalized abdominal pain. EXAM: CT ABDOMEN AND PELVIS WITHOUT CONTRAST TECHNIQUE: Multidetector CT imaging of the abdomen and pelvis was performed following the standard protocol without IV contrast. RADIATION DOSE REDUCTION: This exam was performed according  to the departmental dose-optimization program which includes automated exposure control, adjustment of the mA and/or kV according to patient size and/or use of iterative reconstruction technique. COMPARISON:  December 14, 2022. FINDINGS: Lower chest: No acute abnormality. Hepatobiliary: No focal liver abnormality is seen. Status post cholecystectomy. No biliary dilatation. Pancreas: Unremarkable. No pancreatic ductal dilatation or surrounding inflammatory changes. Spleen: Normal in size without focal abnormality. Adrenals/Urinary Tract: Adrenal glands are unremarkable. Kidneys are normal, without renal calculi, focal lesion, or hydronephrosis. Bladder is unremarkable. Stomach/Bowel: Stomach is within normal limits. Appendix appears normal. No evidence of bowel wall thickening, distention, or inflammatory changes. Vascular/Lymphatic: No significant vascular findings are present. No enlarged abdominal or pelvic lymph nodes. Reproductive: Stable uterine fibroid is noted. No definite adnexal abnormality is noted. Other: No abdominal wall hernia or abnormality. No abdominopelvic ascites. Musculoskeletal: No acute or significant osseous findings. IMPRESSION: No acute abnormality seen in the abdomen or pelvis. Electronically Signed   By: Lupita Raider M.D.   On: 01/27/2023  15:48   DG Foot 2 Views Left Result Date: 01/21/2023 CLINICAL DATA:  Left foot wound EXAM: LEFT FOOT - 2 VIEW COMPARISON:  MRI from 12/19/2022 FINDINGS: Considerable soft tissue swelling of the foot is noted increased when compared with prior exam. Tarsal degenerative changes are seen. The known soft tissue wound posteriorly is not as well appreciated as on clinical exam. No definitive erosive changes are identified to suggest osteomyelitis. IMPRESSION: Generalized soft tissue swelling. No bony changes to suggest osteomyelitis are noted at this time. Electronically Signed   By: Alcide Clever M.D.   On: 01/21/2023 22:28     TODAY-DAY OF DISCHARGE:  Subjective:   Azzie Glatter today has no headache,no chest abdominal pain,no new weakness tingling or numbness, feels much better wants to go home today.   Objective:   Blood pressure 124/60, pulse (!) 106, temperature 99 F (37.2 C), temperature source Oral, resp. rate 19, last menstrual period 10/10/2012, SpO2 98%.  Intake/Output Summary (Last 24 hours) at 02/18/2023 1352 Last data filed at 02/18/2023 1302 Gross per 24 hour  Intake 1440 ml  Output --  Net 1440 ml   There were no vitals filed for this visit.  Exam: Awake Alert, Oriented *3, No new F.N deficits, Normal affect [place].AT,PERRAL Supple Neck,No JVD, No cervical lymphadenopathy appriciated.  Symmetrical Chest wall movement, Good air movement bilaterally, CTAB RRR,No Gallops,Rubs or new Murmurs, No Parasternal Heave +ve B.Sounds, Abd Soft, Non tender, No organomegaly appriciated, No rebound -guarding or rigidity. No Cyanosis, Clubbing or edema, No new Rash or bruise   PERTINENT RADIOLOGIC STUDIES: CT ABDOMEN PELVIS WO CONTRAST Result Date: 02/17/2023 CLINICAL DATA:  Acute abdominal pain. Postop from lower extremity amputation 3 weeks ago. EXAM: CT ABDOMEN AND PELVIS WITHOUT CONTRAST TECHNIQUE: Multidetector CT imaging of the abdomen and pelvis was performed following the  standard protocol without IV contrast. RADIATION DOSE REDUCTION: This exam was performed according to the departmental dose-optimization program which includes automated exposure control, adjustment of the mA and/or kV according to patient size and/or use of iterative reconstruction technique. COMPARISON:  Noncontrast CT on 01/27/2023 FINDINGS: Lower chest: No acute findings. Hepatobiliary: No mass visualized on this unenhanced exam. Prior cholecystectomy. No evidence of biliary obstruction. Pancreas: No mass or inflammatory process visualized on this unenhanced exam. Spleen:  Within normal limits in size. Adrenals/Urinary tract: No evidence of urolithiasis or hydronephrosis. Unremarkable unopacified urinary bladder. Stomach/Bowel: No evidence of obstruction, inflammatory process, or abnormal fluid collections. Vascular/Lymphatic: No pathologically enlarged lymph nodes  identified. No evidence of abdominal aortic aneurysm. Reproductive: Uterus is unremarkable. A solid-appearing mass is seen in the left adnexal region measuring 4.6 cm in maximum diameter, which is contiguous with the left uterine fundus and unchanged since prior studies dating back to 01/11/2020. This is consistent with a pedunculated fibroid. No other masses or free fluid identified. Other:  None. Musculoskeletal:  No suspicious bone lesions identified. IMPRESSION: No acute findings. Stable 4.6 cm pedunculated fibroid in left adnexal region. Electronically Signed   By: Danae Orleans M.D.   On: 02/17/2023 15:52   CT Knee Right Wo Contrast Result Date: 02/17/2023 CLINICAL DATA:  Right knee stump pain. Recent below-knee amputation. EXAM: CT OF THE RIGHT KNEE WITHOUT CONTRAST TECHNIQUE: Multidetector CT imaging of the right knee was performed according to the standard protocol. Multiplanar CT image reconstructions were also generated. RADIATION DOSE REDUCTION: This exam was performed according to the departmental dose-optimization program which  includes automated exposure control, adjustment of the mA and/or kV according to patient size and/or use of iterative reconstruction technique. COMPARISON:  Right knee and lower leg x-rays dated May 12, 2022. FINDINGS: Bones/Joint/Cartilage Postsurgical changes from below-knee amputation with sharp bony margins of the tibia and fibula. No bony destruction. No fracture or dislocation. Advanced tricompartmental degenerative changes with multiple intra-articular bodies. 2.8 x 2.6 cm osteochondroma arising from medial proximal tibia. Trace knee joint effusion. Ligaments Ligaments are suboptimally evaluated by CT. Muscles and Tendons Postsurgical changes. Soft tissue Mild soft tissue swelling of the amputation stump with 2.5 x 6.6 x 3.3 cm partially organized simple fluid collection overlying the superficial fascia anteromedially (series 4, image 152). No soft tissue mass. IMPRESSION: 1. Postsurgical changes from below-knee amputation with 2.5 x 6.6 x 3.3 cm partially organized simple fluid collection overlying the superficial fascia anteromedially. Favor postoperative seroma over abscess in the absence of any open wound or purulent drainage. 2. No CT evidence of osteomyelitis. 3. 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 4. 2.8 cm osteochondroma arising from medial proximal tibia. Electronically Signed   By: Obie Dredge M.D.   On: 02/17/2023 15:47     PERTINENT LAB RESULTS: CBC: Recent Labs    02/17/23 1643 02/18/23 0743  WBC 12.9* 15.2*  HGB 11.7* 7.8*  HCT 39.8 25.7*  PLT 356 436*   CMET CMP     Component Value Date/Time   NA 143 02/18/2023 0743   NA 137 09/20/2019 1530   K 3.6 02/18/2023 0743   CL 108 02/18/2023 0743   CO2 20 (L) 02/18/2023 0743   GLUCOSE 132 (H) 02/18/2023 0743   BUN 31 (H) 02/18/2023 0743   BUN 18 09/20/2019 1530   CREATININE 5.92 (H) 02/18/2023 0743   CALCIUM 8.2 (L) 02/18/2023 0743   PROT 6.9 02/17/2023 1643   ALBUMIN 2.4 (L)  02/17/2023 1643   AST 15 02/17/2023 1643   ALT 8 02/17/2023 1643   ALKPHOS 59 02/17/2023 1643   BILITOT 0.3 02/17/2023 1643   GFR 60.69 09/27/2013 1656   GFRNONAA 8 (L) 02/18/2023 0743    GFR Estimated Creatinine Clearance: 14 mL/min (A) (by C-G formula based on SCr of 5.92 mg/dL (H)). Recent Labs    02/17/23 1643  LIPASE 26   No results for input(s): "CKTOTAL", "CKMB", "CKMBINDEX", "TROPONINI" in the last 72 hours. Invalid input(s): "POCBNP" No results for input(s): "DDIMER" in the last 72 hours. No results for input(s): "HGBA1C" in the last 72 hours. No results for input(s): "CHOL", "HDL", "LDLCALC", "TRIG", "CHOLHDL", "LDLDIRECT" in the  last 72 hours. No results for input(s): "TSH", "T4TOTAL", "T3FREE", "THYROIDAB" in the last 72 hours.  Invalid input(s): "FREET3" No results for input(s): "VITAMINB12", "FOLATE", "FERRITIN", "TIBC", "IRON", "RETICCTPCT" in the last 72 hours. Coags: No results for input(s): "INR" in the last 72 hours.  Invalid input(s): "PT" Microbiology: Recent Results (from the past 240 hours)  Culture, blood (Routine X 2) w Reflex to ID Panel     Status: None (Preliminary result)   Collection Time: 02/17/23 10:42 PM   Specimen: BLOOD  Result Value Ref Range Status   Specimen Description BLOOD BLOOD RIGHT ARM  Final   Special Requests   Final    BOTTLES DRAWN AEROBIC AND ANAEROBIC Blood Culture results may not be optimal due to an inadequate volume of blood received in culture bottles   Culture   Final    NO GROWTH < 12 HOURS Performed at [facility], [address], [place], [place] [address]    Report Status PENDING  Incomplete  Culture, blood (Routine X 2) w Reflex to ID Panel     Status: None (Preliminary result)   Collection Time: 02/17/23 10:43 PM   Specimen: BLOOD  Result Value Ref Range Status   Specimen Description BLOOD BLOOD RIGHT HAND  Final   Special Requests   Final    BOTTLES DRAWN AEROBIC AND ANAEROBIC Blood Culture results  may not be optimal due to an inadequate volume of blood received in culture bottles   Culture   Final    NO GROWTH < 12 HOURS Performed at [facility], [address] [place], [place] [address]    Report Status PENDING  Incomplete    FURTHER DISCHARGE INSTRUCTIONS:  Get Medicines reviewed and adjusted: Please take all your medications with you for your next visit with your Primary MD  Laboratory/radiological data: Please request your Primary MD to go over all hospital tests and procedure/radiological results at the follow up, please ask your Primary MD to get all Hospital records sent to his/her office.  In some cases, they will be blood work, cultures and biopsy results pending at the time of your discharge. Please request that your primary care M.D. goes through all the records of your hospital data and follows up on these results.  Also Note the following: If you experience worsening of your admission symptoms, develop shortness of breath, life threatening emergency, suicidal or homicidal thoughts you must seek medical attention immediately by calling 911 or calling your MD immediately  if symptoms less severe.  You must read complete instructions/literature along with all the possible adverse reactions/side effects for all the Medicines you take and that have been prescribed to you. Take any new Medicines after you have completely understood and accpet all the possible adverse reactions/side effects.   Do not drive when taking Pain medications or sleeping medications (Benzodaizepines)  Do not take more than prescribed Pain, Sleep and Anxiety Medications. It is not advisable to combine anxiety,sleep and pain medications without talking with your primary care practitioner  Special Instructions: If you have smoked or chewed Tobacco  in the last 2 yrs please stop smoking, stop any regular Alcohol  and or any Recreational drug use.  Wear Seat belts while driving.  Please  note: You were cared for by a hospitalist during your hospital stay. Once you are discharged, your primary care physician will handle any further medical issues. Please note that NO REFILLS for any discharge medications will be authorized once you are  discharged, as it is imperative that you return to your primary care physician (or establish a relationship with a primary care physician if you do not have one) for your post hospital discharge needs so that they can reassess your need for medications and monitor your lab values.  Total Time spent coordinating discharge including counseling, education and face to face time equals greater than 30 minutes.  SignedJeoffrey Massed 02/18/2023 1:52 PM

## 2023-02-18 NOTE — Progress Notes (Signed)
Right lower extremity venous duplex has been completed. Preliminary results can be found in CV Proc through chart review.   02/18/23 3:35 PM Olen Cordial RVT

## 2023-02-18 NOTE — TOC Transition Note (Addendum)
Transition of Care [facility]) - Discharge Note   Patient Details  Name: Deborah [name] MRN: [id number] Date of Birth: 08-Aug-1965  [facility] CM/SW Contact:  Gordy Clement, RN Phone Number: 02/18/2023, 3:16 PM   Clinical Narrative:   Update:  Patient states to [facility] she has keys and will be able to get into her home  Husband will be there    RNCM called Mother as contact listed to confirm someone would be at the home before PTAR was called. Mom stated Husband was working but that she would be there if he isn't  Mom requested a list of [employer] companies to offer additional help in the home. She also requested a call from patient RN for an update so she could best help her Daughter at home . RNCM sent secure chat to floor RN with request.   Patient to DC to home today. [facility] to transport home. Patient will resume home health with [facility] . No DME recommended             Patient Goals and CMS Choice            Discharge Placement                       Discharge Plan and Services Additional resources added to the After Visit Summary for                                       Social Drivers of Health (SDOH) Interventions SDOH Screenings   Food Insecurity: No Food Insecurity (01/30/2023)  Recent Concern: Food Insecurity - High Risk (01/25/2023)   Received from [facility]  Housing: Patient Declined (01/30/2023)  Recent Concern: Housing - Medium Risk (01/25/2023)   Received from [facility]  Transportation Needs: No Transportation Needs (01/30/2023)  Recent Concern: Transportation Needs - Unmet Transportation Needs (01/25/2023)   Received from [facility]  Utilities: Not At Risk (01/30/2023)  Recent Concern: Utilities - Medium Risk (01/25/2023)   Received from [facility]  Depression (PHQ2-9): High Risk (11/18/2022)  Financial Resource Strain: Low Risk (03/23/2021)   [facility]  Physical Activity:  Not on File (10/31/2017)   Received from [place]  Social Connections: Unknown (07/19/2021)   Received from [facility]  Stress: Low Risk (03/23/2021)   Received from [facility])  Tobacco Use: Low Risk  (02/17/2023)     Readmission Risk Interventions    11/25/2022    5:16 PM 07/05/2022    1:20 PM  Readmission Risk Prevention Plan  Transportation Screening Complete Complete  Medication Review (RN Care Manager) Complete Referral to Pharmacy  PCP or Specialist appointment within 3-5 days of discharge Complete Complete  HRI or Home Care Consult Complete Complete  SW Recovery Care/Counseling Consult Complete Complete  Palliative Care Screening Not Applicable Not Applicable  Skilled Nursing Facility Not Applicable Not Applicable

## 2023-02-18 NOTE — Plan of Care (Signed)

## 2023-02-21 ENCOUNTER — Ambulatory Visit: Payer: 59 | Admitting: Registered Nurse

## 2023-02-21 ENCOUNTER — Telehealth: Payer: Self-pay | Admitting: Orthopedic Surgery

## 2023-02-21 NOTE — Telephone Encounter (Signed)
Deborah [name]) from [facility] called requesting orders for pt to start Wednesday 12/18.for physical therapy. Pt went to hospital so was unable to have session and pt is a dialysis  pt Tuesday. Christy secure phone number is [phone].

## 2023-02-22 LAB — CULTURE, BLOOD (ROUTINE X 2)
Culture: NO GROWTH
Culture: NO GROWTH

## 2023-02-22 NOTE — Telephone Encounter (Signed)
Christy informed okay with VO

## 2023-02-23 ENCOUNTER — Telehealth: Payer: Self-pay

## 2023-02-23 NOTE — Telephone Encounter (Signed)
Almira with [facility] called stating that patient is experiencing a great amount of pain right above her right BKA.  Would like a Rx for pain sent to her pharmacy and would like a verbal order for HHPT for 1 x week for 6 weeks.  CB# is [phone].  Please advise.  Thank you.

## 2023-02-24 ENCOUNTER — Other Ambulatory Visit: Payer: Self-pay | Admitting: Orthopedic Surgery

## 2023-02-24 MED ORDER — HYDROMORPHONE HCL 4 MG PO TABS: 4.0000 mg | ORAL_TABLET | ORAL | 0 refills | Status: DC

## 2023-02-25 NOTE — Telephone Encounter (Signed)
Lm for Almira okay for HHPT and that pain med was sent to her pharmacy

## 2023-02-28 ENCOUNTER — Inpatient Hospital Stay (HOSPITAL_COMMUNITY)
Admission: EM | Admit: 2023-02-28 | Discharge: 2023-03-08 | DRG: 391 | Disposition: A | Discharge status: Home Health | Payer: 59 | Attending: Internal Medicine | Admitting: Internal Medicine

## 2023-02-28 ENCOUNTER — Encounter (HOSPITAL_COMMUNITY): Payer: Self-pay | Admitting: *Deleted

## 2023-02-28 ENCOUNTER — Emergency Department (HOSPITAL_COMMUNITY): Payer: 59

## 2023-02-28 ENCOUNTER — Other Ambulatory Visit: Payer: Self-pay

## 2023-02-28 DIAGNOSIS — K3184 Gastroparesis: Secondary | ICD-10-CM | POA: Diagnosis present

## 2023-02-28 DIAGNOSIS — G629 Polyneuropathy, unspecified: Secondary | ICD-10-CM

## 2023-02-28 DIAGNOSIS — E876 Hypokalemia: Secondary | ICD-10-CM | POA: Diagnosis present

## 2023-02-28 DIAGNOSIS — R451 Restlessness and agitation: Secondary | ICD-10-CM | POA: Diagnosis present

## 2023-02-28 DIAGNOSIS — I5033 Acute on chronic diastolic (congestive) heart failure: Secondary | ICD-10-CM

## 2023-02-28 DIAGNOSIS — G2401 Drug induced subacute dyskinesia: Secondary | ICD-10-CM | POA: Diagnosis present

## 2023-02-28 DIAGNOSIS — D649 Anemia, unspecified: Secondary | ICD-10-CM | POA: Diagnosis present

## 2023-02-28 DIAGNOSIS — I959 Hypotension, unspecified: Secondary | ICD-10-CM | POA: Diagnosis not present

## 2023-02-28 DIAGNOSIS — I5032 Chronic diastolic (congestive) heart failure: Secondary | ICD-10-CM | POA: Diagnosis present

## 2023-02-28 DIAGNOSIS — E1122 Type 2 diabetes mellitus with diabetic chronic kidney disease: Secondary | ICD-10-CM | POA: Diagnosis present

## 2023-02-28 DIAGNOSIS — Z79899 Other long term (current) drug therapy: Secondary | ICD-10-CM

## 2023-02-28 DIAGNOSIS — Z8249 Family history of ischemic heart disease and other diseases of the circulatory system: Secondary | ICD-10-CM

## 2023-02-28 DIAGNOSIS — Z886 Allergy status to analgesic agent status: Secondary | ICD-10-CM

## 2023-02-28 DIAGNOSIS — I482 Chronic atrial fibrillation, unspecified: Secondary | ICD-10-CM | POA: Diagnosis present

## 2023-02-28 DIAGNOSIS — I428 Other cardiomyopathies: Secondary | ICD-10-CM | POA: Diagnosis present

## 2023-02-28 DIAGNOSIS — E11621 Type 2 diabetes mellitus with foot ulcer: Secondary | ICD-10-CM | POA: Diagnosis present

## 2023-02-28 DIAGNOSIS — T8789 Other complications of amputation stump: Secondary | ICD-10-CM

## 2023-02-28 DIAGNOSIS — D72829 Elevated white blood cell count, unspecified: Secondary | ICD-10-CM | POA: Diagnosis present

## 2023-02-28 DIAGNOSIS — R197 Diarrhea, unspecified: Secondary | ICD-10-CM | POA: Diagnosis not present

## 2023-02-28 DIAGNOSIS — Z79891 Long term (current) use of opiate analgesic: Secondary | ICD-10-CM

## 2023-02-28 DIAGNOSIS — E785 Hyperlipidemia, unspecified: Secondary | ICD-10-CM | POA: Diagnosis present

## 2023-02-28 DIAGNOSIS — M4800 Spinal stenosis, site unspecified: Secondary | ICD-10-CM | POA: Diagnosis present

## 2023-02-28 DIAGNOSIS — Z880 Allergy status to penicillin: Secondary | ICD-10-CM

## 2023-02-28 DIAGNOSIS — I5030 Unspecified diastolic (congestive) heart failure: Secondary | ICD-10-CM | POA: Diagnosis present

## 2023-02-28 DIAGNOSIS — R52 Pain, unspecified: Principal | ICD-10-CM

## 2023-02-28 DIAGNOSIS — E1143 Type 2 diabetes mellitus with diabetic autonomic (poly)neuropathy: Secondary | ICD-10-CM | POA: Diagnosis present

## 2023-02-28 DIAGNOSIS — Z515 Encounter for palliative care: Secondary | ICD-10-CM

## 2023-02-28 DIAGNOSIS — Z8673 Personal history of transient ischemic attack (TIA), and cerebral infarction without residual deficits: Secondary | ICD-10-CM

## 2023-02-28 DIAGNOSIS — N186 End stage renal disease: Secondary | ICD-10-CM | POA: Diagnosis present

## 2023-02-28 DIAGNOSIS — Z9049 Acquired absence of other specified parts of digestive tract: Secondary | ICD-10-CM

## 2023-02-28 DIAGNOSIS — E872 Acidosis, unspecified: Secondary | ICD-10-CM | POA: Diagnosis present

## 2023-02-28 DIAGNOSIS — I509 Heart failure, unspecified: Secondary | ICD-10-CM

## 2023-02-28 DIAGNOSIS — Z885 Allergy status to narcotic agent status: Secondary | ICD-10-CM

## 2023-02-28 DIAGNOSIS — E119 Type 2 diabetes mellitus without complications: Secondary | ICD-10-CM

## 2023-02-28 DIAGNOSIS — F32A Depression, unspecified: Secondary | ICD-10-CM | POA: Diagnosis present

## 2023-02-28 DIAGNOSIS — Z792 Long term (current) use of antibiotics: Secondary | ICD-10-CM

## 2023-02-28 DIAGNOSIS — Z5982 Transportation insecurity: Secondary | ICD-10-CM

## 2023-02-28 DIAGNOSIS — L97529 Non-pressure chronic ulcer of other part of left foot with unspecified severity: Secondary | ICD-10-CM | POA: Diagnosis present

## 2023-02-28 DIAGNOSIS — K219 Gastro-esophageal reflux disease without esophagitis: Secondary | ICD-10-CM | POA: Diagnosis present

## 2023-02-28 DIAGNOSIS — K7581 Nonalcoholic steatohepatitis (NASH): Secondary | ICD-10-CM | POA: Diagnosis present

## 2023-02-28 DIAGNOSIS — D259 Leiomyoma of uterus, unspecified: Secondary | ICD-10-CM | POA: Diagnosis present

## 2023-02-28 DIAGNOSIS — E861 Hypovolemia: Secondary | ICD-10-CM | POA: Diagnosis not present

## 2023-02-28 DIAGNOSIS — D631 Anemia in chronic kidney disease: Secondary | ICD-10-CM | POA: Diagnosis present

## 2023-02-28 DIAGNOSIS — G6289 Other specified polyneuropathies: Secondary | ICD-10-CM | POA: Diagnosis not present

## 2023-02-28 DIAGNOSIS — I132 Hypertensive heart and chronic kidney disease with heart failure and with stage 5 chronic kidney disease, or end stage renal disease: Secondary | ICD-10-CM | POA: Diagnosis present

## 2023-02-28 DIAGNOSIS — Z794 Long term (current) use of insulin: Secondary | ICD-10-CM

## 2023-02-28 DIAGNOSIS — Z91158 Patient's noncompliance with renal dialysis for other reason: Secondary | ICD-10-CM

## 2023-02-28 DIAGNOSIS — I480 Paroxysmal atrial fibrillation: Secondary | ICD-10-CM | POA: Diagnosis present

## 2023-02-28 DIAGNOSIS — Z833 Family history of diabetes mellitus: Secondary | ICD-10-CM

## 2023-02-28 DIAGNOSIS — R109 Unspecified abdominal pain: Secondary | ICD-10-CM | POA: Diagnosis present

## 2023-02-28 DIAGNOSIS — M79604 Pain in right leg: Secondary | ICD-10-CM | POA: Diagnosis present

## 2023-02-28 DIAGNOSIS — M109 Gout, unspecified: Secondary | ICD-10-CM | POA: Diagnosis present

## 2023-02-28 DIAGNOSIS — G894 Chronic pain syndrome: Secondary | ICD-10-CM | POA: Diagnosis present

## 2023-02-28 DIAGNOSIS — I10 Essential (primary) hypertension: Secondary | ICD-10-CM | POA: Diagnosis present

## 2023-02-28 DIAGNOSIS — Z888 Allergy status to other drugs, medicaments and biological substances status: Secondary | ICD-10-CM

## 2023-02-28 DIAGNOSIS — Z8711 Personal history of peptic ulcer disease: Secondary | ICD-10-CM

## 2023-02-28 DIAGNOSIS — Z6841 Body Mass Index (BMI) 40.0 and over, adult: Secondary | ICD-10-CM

## 2023-02-28 DIAGNOSIS — Z5941 Food insecurity: Secondary | ICD-10-CM

## 2023-02-28 DIAGNOSIS — R000 Tachycardia, unspecified: Secondary | ICD-10-CM | POA: Diagnosis not present

## 2023-02-28 DIAGNOSIS — F419 Anxiety disorder, unspecified: Secondary | ICD-10-CM | POA: Diagnosis present

## 2023-02-28 DIAGNOSIS — Z992 Dependence on renal dialysis: Secondary | ICD-10-CM

## 2023-02-28 DIAGNOSIS — M797 Fibromyalgia: Secondary | ICD-10-CM | POA: Diagnosis present

## 2023-02-28 DIAGNOSIS — Z89511 Acquired absence of right leg below knee: Secondary | ICD-10-CM

## 2023-02-28 DIAGNOSIS — G8929 Other chronic pain: Secondary | ICD-10-CM | POA: Diagnosis present

## 2023-02-28 DIAGNOSIS — R1013 Epigastric pain: Secondary | ICD-10-CM

## 2023-02-28 LAB — COMPREHENSIVE METABOLIC PANEL
ALT: 14 U/L (ref 0–44)
AST: 26 U/L (ref 15–41)
Albumin: 2.5 g/dL — ABNORMAL LOW (ref 3.5–5.0)
Alkaline Phosphatase: 73 U/L (ref 38–126)
Anion gap: 14 (ref 5–15)
BUN: 27 mg/dL — ABNORMAL HIGH (ref 6–20)
CO2: 13 mmol/L — ABNORMAL LOW (ref 22–32)
Calcium: 7.9 mg/dL — ABNORMAL LOW (ref 8.9–10.3)
Chloride: 114 mmol/L — ABNORMAL HIGH (ref 98–111)
Creatinine, Ser: 5.36 mg/dL — ABNORMAL HIGH (ref 0.44–1.00)
GFR, Estimated: 9 mL/min — ABNORMAL LOW (ref >60)
Glucose, Bld: 132 mg/dL — ABNORMAL HIGH (ref 70–99)
Potassium: 3 mmol/L — ABNORMAL LOW (ref 3.5–5.1)
Sodium: 141 mmol/L (ref 135–145)
Total Bilirubin: 0.6 mg/dL (ref <1.2)
Total Protein: 7 g/dL (ref 6.5–8.1)

## 2023-02-28 LAB — CBC WITH DIFFERENTIAL/PLATELET
Abs Immature Granulocytes: 0.14 10*3/uL — ABNORMAL HIGH (ref 0.00–0.07)
Basophils Absolute: 0.1 10*3/uL (ref 0.0–0.1)
Basophils Relative: 1 %
Eosinophils Absolute: 0.4 10*3/uL (ref 0.0–0.5)
Eosinophils Relative: 2 %
HCT: 29.8 % — ABNORMAL LOW (ref 36.0–46.0)
Hemoglobin: 9.1 g/dL — ABNORMAL LOW (ref 12.0–15.0)
Immature Granulocytes: 1 %
Lymphocytes Relative: 16 %
Lymphs Abs: 2.8 10*3/uL (ref 0.7–4.0)
MCH: 29.4 pg (ref 26.0–34.0)
MCHC: 30.5 g/dL (ref 30.0–36.0)
MCV: 96.1 fL (ref 80.0–100.0)
Monocytes Absolute: 0.8 10*3/uL (ref 0.1–1.0)
Monocytes Relative: 5 %
Neutro Abs: 12.9 10*3/uL — ABNORMAL HIGH (ref 1.7–7.7)
Neutrophils Relative %: 75 %
Platelets: 423 10*3/uL — ABNORMAL HIGH (ref 150–400)
RBC: 3.1 MIL/uL — ABNORMAL LOW (ref 3.87–5.11)
RDW: 15.8 % — ABNORMAL HIGH (ref 11.5–15.5)
WBC: 17.2 10*3/uL — ABNORMAL HIGH (ref 4.0–10.5)
nRBC: 0 % (ref 0.0–0.2)

## 2023-02-28 LAB — I-STAT CHEM 8, ED
BUN: 31 mg/dL — ABNORMAL HIGH (ref 6–20)
Calcium, Ion: 0.96 mmol/L — ABNORMAL LOW (ref 1.15–1.40)
Chloride: 114 mmol/L — ABNORMAL HIGH (ref 98–111)
Creatinine, Ser: 5.8 mg/dL — ABNORMAL HIGH (ref 0.44–1.00)
Glucose, Bld: 128 mg/dL — ABNORMAL HIGH (ref 70–99)
HCT: 29 % — ABNORMAL LOW (ref 36.0–46.0)
Hemoglobin: 9.9 g/dL — ABNORMAL LOW (ref 12.0–15.0)
Potassium: 3 mmol/L — ABNORMAL LOW (ref 3.5–5.1)
Sodium: 144 mmol/L (ref 135–145)
TCO2: 15 mmol/L — ABNORMAL LOW (ref 22–32)

## 2023-02-28 LAB — CBG MONITORING, ED
Glucose-Capillary: 113 mg/dL — ABNORMAL HIGH (ref 70–99)
Glucose-Capillary: 165 mg/dL — ABNORMAL HIGH (ref 70–99)

## 2023-02-28 LAB — C DIFFICILE QUICK SCREEN W PCR REFLEX
C Diff antigen: NEGATIVE
C Diff interpretation: NOT DETECTED
C Diff toxin: NEGATIVE

## 2023-02-28 LAB — TROPONIN I (HIGH SENSITIVITY)
Troponin I (High Sensitivity): 15 ng/L (ref <18)
Troponin I (High Sensitivity): 13 ng/L (ref <18)

## 2023-02-28 LAB — GLUCOSE, CAPILLARY: Glucose-Capillary: 129 mg/dL — ABNORMAL HIGH (ref 70–99)

## 2023-02-28 LAB — LACTIC ACID, PLASMA
Lactic Acid, Venous: 2 mmol/L (ref 0.5–1.9)
Lactic Acid, Venous: 2.6 mmol/L (ref 0.5–1.9)

## 2023-02-28 LAB — HEPATITIS B SURFACE ANTIGEN: Hepatitis B Surface Ag: NONREACTIVE

## 2023-02-28 LAB — LIPASE, BLOOD: Lipase: 40 U/L (ref 11–51)

## 2023-02-28 MED ORDER — SODIUM CHLORIDE 0.9% FLUSH
3.0000 mL | Freq: Two times a day (BID) | INTRAVENOUS | Status: DC
  Administered 2023-02-28 – 2023-03-08 (×16): 3 mL via INTRAVENOUS

## 2023-02-28 MED ORDER — METOPROLOL TARTRATE 50 MG PO TABS
50.0000 mg | ORAL_TABLET | Freq: Two times a day (BID) | ORAL | Status: DC
  Administered 2023-02-28 – 2023-03-06 (×9): 50 mg via ORAL
  Filled 2023-02-28 (×12): qty 1

## 2023-02-28 MED ORDER — ACETAMINOPHEN 650 MG RE SUPP: 650.0000 mg | Freq: Four times a day (QID) | RECTAL | Status: DC | PRN

## 2023-02-28 MED ORDER — GABAPENTIN 100 MG PO CAPS
100.0000 mg | ORAL_CAPSULE | Freq: Three times a day (TID) | ORAL | Status: DC
Start: 2023-02-28 — End: 2023-02-28

## 2023-02-28 MED ORDER — ATORVASTATIN CALCIUM 10 MG PO TABS
10.0000 mg | ORAL_TABLET | Freq: Every evening | ORAL | Status: DC
  Administered 2023-02-28 – 2023-03-08 (×9): 10 mg via ORAL
  Filled 2023-02-28 (×8): qty 1

## 2023-02-28 MED ORDER — HYDROMORPHONE HCL 1 MG/ML IJ SOLN
0.5000 mg | INTRAMUSCULAR | Status: DC | PRN
  Administered 2023-02-28 – 2023-03-03 (×20): 0.5 mg via INTRAVENOUS
  Filled 2023-02-28 (×20): qty 0.5

## 2023-02-28 MED ORDER — PENTAFLUOROPROP-TETRAFLUOROETH EX AERO: 1.0000 | INHALATION_SPRAY | CUTANEOUS | Status: DC | PRN

## 2023-02-28 MED ORDER — ALLOPURINOL 100 MG PO TABS
100.0000 mg | ORAL_TABLET | Freq: Two times a day (BID) | ORAL | Status: DC
  Administered 2023-02-28 – 2023-03-08 (×16): 100 mg via ORAL
  Filled 2023-02-28 (×17): qty 1

## 2023-02-28 MED ORDER — LIDOCAINE HCL (PF) 1 % IJ SOLN: 5.0000 mL | INTRAMUSCULAR | Status: DC | PRN

## 2023-02-28 MED ORDER — HYDROMORPHONE HCL 1 MG/ML IJ SOLN
0.5000 mg | Freq: Once | INTRAMUSCULAR | Status: AC
  Administered 2023-02-28: 0.5 mg via INTRAVENOUS
  Filled 2023-02-28: qty 1

## 2023-02-28 MED ORDER — GERHARDT'S BUTT CREAM
TOPICAL_CREAM | Freq: Three times a day (TID) | CUTANEOUS | Status: DC
Start: 2023-02-28 — End: 2023-03-09
  Filled 2023-02-28 (×2): qty 60

## 2023-02-28 MED ORDER — PANTOPRAZOLE SODIUM 40 MG IV SOLR
40.0000 mg | Freq: Once | INTRAVENOUS | Status: AC
Start: 2023-02-28 — End: 2023-02-28
  Administered 2023-02-28: 40 mg via INTRAVENOUS
  Filled 2023-02-28: qty 10

## 2023-02-28 MED ORDER — POLYETHYLENE GLYCOL 3350 17 G PO PACK: 17.0000 g | PACK | Freq: Every day | ORAL | Status: DC | PRN

## 2023-02-28 MED ORDER — ALTEPLASE 2 MG IJ SOLR: 2.0000 mg | Freq: Once | INTRAMUSCULAR | Status: DC | PRN

## 2023-02-28 MED ORDER — HYDROMORPHONE HCL 2 MG PO TABS: 2.0000 mg | ORAL_TABLET | Freq: Three times a day (TID) | ORAL | Status: DC | PRN

## 2023-02-28 MED ORDER — ANTICOAGULANT SODIUM CITRATE 4% (200MG/5ML) IV SOLN: 5.0000 mL | Status: DC | PRN

## 2023-02-28 MED ORDER — HYDROXYZINE HCL 25 MG PO TABS
25.0000 mg | ORAL_TABLET | Freq: Three times a day (TID) | ORAL | Status: DC | PRN
  Administered 2023-02-28 – 2023-03-03 (×6): 25 mg via ORAL
  Filled 2023-02-28 (×6): qty 1

## 2023-02-28 MED ORDER — CEFADROXIL 500 MG PO CAPS
500.0000 mg | ORAL_CAPSULE | Freq: Every day | ORAL | Status: DC
  Administered 2023-03-01 – 2023-03-07 (×7): 500 mg via ORAL
  Filled 2023-02-28 (×8): qty 1

## 2023-02-28 MED ORDER — DOXYCYCLINE HYCLATE 100 MG PO TABS
100.0000 mg | ORAL_TABLET | Freq: Two times a day (BID) | ORAL | Status: DC
  Administered 2023-02-28 – 2023-03-07 (×14): 100 mg via ORAL
  Filled 2023-02-28 (×16): qty 1

## 2023-02-28 MED ORDER — ACETAMINOPHEN 325 MG PO TABS: 650.0000 mg | ORAL_TABLET | Freq: Four times a day (QID) | ORAL | Status: DC | PRN

## 2023-02-28 MED ORDER — INSULIN ASPART 100 UNIT/ML IJ SOLN
0.0000 [IU] | Freq: Three times a day (TID) | INTRAMUSCULAR | Status: DC
  Administered 2023-03-02 – 2023-03-06 (×4): 1 [IU] via SUBCUTANEOUS

## 2023-02-28 MED ORDER — MIRTAZAPINE 15 MG PO TABS
15.0000 mg | ORAL_TABLET | Freq: Every day | ORAL | Status: DC
  Administered 2023-02-28 – 2023-03-08 (×9): 15 mg via ORAL
  Filled 2023-02-28 (×9): qty 1

## 2023-02-28 MED ORDER — BUSPIRONE HCL 5 MG PO TABS
5.0000 mg | ORAL_TABLET | Freq: Three times a day (TID) | ORAL | Status: DC
  Administered 2023-02-28 – 2023-03-01 (×2): 5 mg via ORAL
  Filled 2023-02-28 (×2): qty 1

## 2023-02-28 MED ORDER — HYDROMORPHONE HCL 1 MG/ML IJ SOLN
1.0000 mg | Freq: Once | INTRAMUSCULAR | Status: AC
  Administered 2023-02-28: 1 mg via INTRAVENOUS
  Filled 2023-02-28: qty 1

## 2023-02-28 MED ORDER — GABAPENTIN 100 MG PO CAPS
100.0000 mg | ORAL_CAPSULE | Freq: Three times a day (TID) | ORAL | Status: DC
  Administered 2023-03-01: 100 mg via ORAL
  Filled 2023-02-28 (×2): qty 1

## 2023-02-28 MED ORDER — HEPARIN SODIUM (PORCINE) 1000 UNIT/ML DIALYSIS: 1000.0000 [IU] | INTRAMUSCULAR | Status: DC | PRN

## 2023-02-28 MED ORDER — SODIUM CHLORIDE 0.9 % IV BOLUS
500.0000 mL | Freq: Once | INTRAVENOUS | Status: AC
  Administered 2023-02-28: 500 mL via INTRAVENOUS

## 2023-02-28 MED ORDER — PANTOPRAZOLE SODIUM 40 MG PO TBEC
40.0000 mg | DELAYED_RELEASE_TABLET | Freq: Two times a day (BID) | ORAL | Status: DC
  Administered 2023-02-28 – 2023-03-08 (×16): 40 mg via ORAL
  Filled 2023-02-28 (×17): qty 1

## 2023-02-28 MED ORDER — METRONIDAZOLE 500 MG PO TABS
500.0000 mg | ORAL_TABLET | Freq: Two times a day (BID) | ORAL | Status: DC
Start: 2023-02-28 — End: 2023-03-08
  Administered 2023-02-28 – 2023-03-07 (×15): 500 mg via ORAL
  Filled 2023-02-28 (×16): qty 1

## 2023-02-28 MED ORDER — HEPARIN SODIUM (PORCINE) 5000 UNIT/ML IJ SOLN
5000.0000 [IU] | Freq: Three times a day (TID) | INTRAMUSCULAR | Status: DC
  Administered 2023-02-28 – 2023-03-08 (×24): 5000 [IU] via SUBCUTANEOUS
  Filled 2023-02-28 (×24): qty 1

## 2023-02-28 MED ORDER — HYDROMORPHONE HCL 2 MG PO TABS: 4.0000 mg | ORAL_TABLET | Freq: Three times a day (TID) | ORAL | Status: DC | PRN

## 2023-02-28 MED ORDER — LIDOCAINE-PRILOCAINE 2.5-2.5 % EX CREA: 1.0000 | TOPICAL_CREAM | CUTANEOUS | Status: DC | PRN

## 2023-02-28 NOTE — ED Provider Notes (Signed)
[place] EMERGENCY DEPARTMENT AT [facility] Provider Note   [name]: [id number] Arrival date & time: 02/28/23  1005     History Chief Complaint  Patient presents with   Abdominal Pain    Deborah [name] is a 57 y.o. female with history of hypertension, diabetes, gastroparesis, paroxysmal A-fib, congestive heart failure, chronic wound to the left heel, BKA, pain seeking behavior, ESRD on T/Th/Sat presents to the emergency department today for evaluation of epigastric abdominal pain and stump pain. She reports that she has also been having multiple episodes of non bloody, not black diarrhea for the past week. Reports epigastric pain that started today, but radiates throughout the abdomen. She reports that her stump started to hurt since yesterday. She has not been able to follow up for her pain medications as she couldn't find a ride. She denies nausea, vomiting, or fevers. She Reports that she feels like she has had a decrease in urine output. Denies any dysuria. She denies any discharge from her stump. Denies any new falls or injury. She reports "she thinks" she had chest pain this morning, but currently doesn't have any. Denies SOB. Additionally, she missed her Saturday dialysis appointment as she didn't feel well. Denies any recent cough or cold symptoms.    Abdominal Pain Associated symptoms: chest pain and diarrhea   Associated symptoms: no chills, no constipation, no dysuria, no fever, no hematuria, no nausea, no shortness of breath and no vomiting        Home Medications Prior to Admission medications   Medication Sig Start Date End Date Taking? Authorizing Provider  albuterol (PROVENTIL) (2.5 MG/3ML) 0.083% nebulizer solution Take 3 mLs (2.5 mg total) by nebulization every 6 (six) hours as needed for wheezing or shortness of breath. 04/06/19   Bing Neighbors, NP  allopurinol (ZYLOPRIM) 100 MG tablet Take 1 tablet (100 mg total) by mouth 2 (two) times daily.  11/30/22   Azucena Fallen, MD  Amino Acids-Protein Hydrolys (FEEDING SUPPLEMENT, PRO-STAT SUGAR FREE 64,) LIQD Take 30 mLs by mouth 2 (two) times daily between meals.    [provider]  ascorbic acid (VITAMIN C) 500 MG tablet Take 500 mg by mouth daily.    [provider]  atorvastatin (LIPITOR) 10 MG tablet Take 10 mg by mouth every evening. 12/03/21   [provider]  busPIRone (BUSPAR) 5 MG tablet Take 5 mg by mouth 3 (three) times daily.    [provider]  cefadroxil (DURICEF) 500 MG capsule Take 1 capsule (500 mg total) by mouth daily for 14 days. 02/23/23   Leroy Sea, MD  cefTAZidime 0.5 g in dextrose 5 % 50 mL Inject 0.5 g into the vein daily. 02/15/23   Leroy Sea, MD  cetirizine (ZYRTEC) 10 MG tablet Take 10 mg by mouth daily. 08/05/22   [provider]  colchicine 0.6 MG tablet Take 0.5 tablets (0.3 mg total) by mouth 2 (two) times a week. Patient not taking: Reported on 01/29/2023 12/02/22   Azucena Fallen, MD  doxycycline (VIBRA-TABS) 100 MG tablet Take 1 tablet (100 mg total) by mouth 2 (two) times daily. 02/23/23   Leroy Sea, MD  famotidine (PEPCID) 20 MG tablet Take 20 mg by mouth daily before breakfast.    [provider]  ferric citrate (AURYXIA) 1 GM 210 MG(Fe) tablet Take 2 tablets (420 mg total) by mouth 3 (three) times daily with meals. Patient taking differently: Take 210 mg by mouth 3 (three)  times daily with meals. 11/30/22   Azucena Fallen, MD  fluticasone [facility]) 50 MCG/ACT nasal spray Place 2 sprays into both nostrils daily as needed for allergies or rhinitis. 12/19/18   Rai, Delene Ruffini, MD  folic acid (FOLVITE) 1 MG tablet Take 1 tablet (1 mg total) by mouth daily. 01/10/23   Danford, Earl Lites, MD  gabapentin (NEURONTIN) 100 MG capsule Take 1 capsule (100 mg total) by mouth 3 (three) times daily. 11/30/22   Azucena Fallen, MD  HYDROmorphone (DILAUDID) 4 MG tablet Take 1  tablet (4 mg total) by mouth See admin instructions. Give 4 mg (1 tablet) three times daily for pain. Give first dose at 0600 on dialysis days (T-Th-Sat), otherwise give first dose at 0900. May give an additional tablet every 24 hours as needed for severe pain (do not administer within 3 hours of scheduled dose). 02/24/23   Nadara Mustard, MD  hydrOXYzine (ATARAX) 25 MG tablet Take 1 tablet (25 mg total) by mouth 3 (three) times daily as needed for anxiety. Patient taking differently: Take 25 mg by mouth See admin instructions. Give 25 mg (1 tablet) every 6 hours as needed for anxiety, agitation for 14 days (starting 11/19), then every 8 hours as needed. 01/10/23   Danford, Earl Lites, MD  insulin glargine (SEMGLEE, YFGN,) 100 UNIT/ML Solostar Pen Inject 8 Units into the skin at bedtime.    [provider]  insulin lispro (HUMALOG) 100 UNIT/ML KwikPen Before each meal 3 times a day, 140-199 - 2 units, 200-250 - 6 units, 251-299 - 8 units,  300-349 - 12 units,  350 or above 14 units. Patient taking differently: Inject 0-14 Units into the skin See admin instructions. Inject 0-14 units per sliding scale before meals: < 70 notify MD 70-139 : 0 units 140-199 : 2 units 200-250 : 6 units 251-299 : 8 units 300-349 : 12 units 350-400 : 14 units > 401 notify MD. 06/04/22   Leroy Sea, MD  loperamide (IMODIUM) 2 MG capsule Take 1 capsule (2 mg total) by mouth as needed for diarrhea or loose stools. Patient taking differently: Take 2 mg by mouth daily as needed for diarrhea or loose stools. 01/10/23   Danford, Earl Lites, MD  methocarbamol (ROBAXIN) 500 MG tablet Take 1 tablet (500 mg total) by mouth every 6 (six) hours as needed for muscle spasms. 01/10/23   Danford, Earl Lites, MD  metoprolol tartrate (LOPRESSOR) 50 MG tablet Take 1 tablet (50 mg total) by mouth 2 (two) times daily. 01/10/23   Danford, Earl Lites, MD  metroNIDAZOLE (FLAGYL) 500 MG tablet Take 1 tablet (500 mg total) by  mouth every 12 (twelve) hours. 02/15/23   Leroy Sea, MD  mirtazapine (REMERON) 15 MG tablet Take 15 mg by mouth at bedtime. 10/18/22   [provider]  Multiple Vitamins-Minerals (SENIOR MULTIVITAMIN PLUS PO) Take 1 tablet by mouth daily.    [provider]  nutrition supplement, JUVEN, (JUVEN) PACK Take 1 packet by mouth 2 (two) times daily between meals. 01/10/23   Danford, Earl Lites, MD  Nutritional Supplement LIQD Take 120-237 mLs by mouth See admin instructions. House supplement. Give 120 mL in the morning at 0900 and 237 mL twice daily between meals (1000, 1400)    [provider]  Nutritional Supplements (,FEEDING SUPPLEMENT, PROSOURCE PLUS) liquid Take 30 mLs by mouth 2 (two) times daily between meals. 01/10/23   Danford, Earl Lites, MD  pantoprazole (PROTONIX) 40 MG tablet Take 1  tablet (40 mg total) by mouth 2 (two) times daily. 02/18/23   Horton Chin, MD  vancomycin (VANCOCIN) 1-5 GM/200ML-% SOLN Inject 200 mLs (1,000 mg total) into the vein every Monday, Wednesday, and Friday with hemodialysis. 02/15/23   Leroy Sea, MD      Allergies    Isovue [iopamidol], Neurontin [gabapentin], Nsaids, Penicillins, Reglan [metoclopramide], Valium [diazepam], Zestril [lisinopril], Tolectin [tolmetin], Asa [aspirin], Aspartame and phenylalanine, Bentyl [dicyclomine], Hibiclens [chlorhexidine gluconate], Flexeril [cyclobenzaprine], Oxycontin [oxycodone], Rifamycins, Tylenol [acetaminophen], and Ultram [tramadol]    Review of Systems   Review of Systems  Constitutional:  Negative for chills and fever.  HENT:  Negative for congestion and rhinorrhea.   Respiratory:  Negative for shortness of breath.   Cardiovascular:  Positive for chest pain.  Gastrointestinal:  Positive for abdominal pain and diarrhea. Negative for blood in stool, constipation, nausea and vomiting.  Genitourinary:  Positive for decreased urine volume. Negative for dysuria and  hematuria.    Physical Exam Updated Vital Signs BP 109/88 (BP Location: Right Wrist)   Pulse (!) 120   Temp (!) 97.3 F (36.3 C) (Oral)   Resp 20   Ht 5\' 6"  (1.676 m)   Wt 113.4 kg   LMP 10/10/2012   SpO2 100%   BMI 40.35 kg/m  Physical Exam Vitals and nursing note reviewed.  Constitutional:      Appearance: She is not toxic-appearing.  HENT:     Mouth/Throat:     Comments: Dry mucous membranes. Cracked lips.  Eyes:     General: No scleral icterus. Cardiovascular:     Rate and Rhythm: Tachycardia present.  Pulmonary:     Effort: Pulmonary effort is normal. No respiratory distress.  Abdominal:     Palpations: Abdomen is soft.     Tenderness: There is generalized abdominal tenderness. There is no guarding or rebound.  Musculoskeletal:     Comments: BKA on the right leg. Staples and a few sutures intact. Small amount of crusting. No discharge. No increase in warmth. No induration or fluctuance appreciated. Small ulcer to the heel on the left foot.   Skin:    General: Skin is warm and dry.     Comments: The patient has multiple Stage 1-2 small wounds on her bilateral buttocks and superior gluteal cleft.   Neurological:     Mental Status: She is alert.     ED Results / Procedures / Treatments   Labs (all labs ordered are listed, but only abnormal results are displayed) Labs Reviewed  CBG MONITORING, ED - Abnormal; Notable for the following components:      Result Value   Glucose-Capillary 113 (*)    All other components within normal limits  GASTROINTESTINAL PANEL BY PCR, STOOL (REPLACES STOOL CULTURE)  C DIFFICILE QUICK SCREEN W PCR REFLEX    CBC WITH DIFFERENTIAL/PLATELET  COMPREHENSIVE METABOLIC PANEL  LIPASE, BLOOD  LACTIC ACID, PLASMA  LACTIC ACID, PLASMA  I-STAT CHEM 8, ED  TROPONIN I (HIGH SENSITIVITY)    EKG EKG Interpretation Date/Time:  Monday February 28 2023 10:27:19 EST Ventricular Rate:  120 PR Interval:  155 QRS Duration:  87 QT  Interval:  337 QTC Calculation: 477 R Axis:   -52  Text Interpretation: Sinus tachycardia Multiple premature complexes, vent & supraven Left anterior fascicular block Confirmed by Virgina Norfolk [phone]) on 02/28/2023 11:42:27 AM  Radiology CT ABDOMEN PELVIS WO CONTRAST Result Date: 02/28/2023 CLINICAL DATA:  Right-sided abdominal pain. EXAM: CT ABDOMEN AND PELVIS WITHOUT CONTRAST TECHNIQUE: Multidetector  CT imaging of the abdomen and pelvis was performed following the standard protocol without IV contrast. RADIATION DOSE REDUCTION: This exam was performed according to the departmental dose-optimization program which includes automated exposure control, adjustment of the mA and/or kV according to patient size and/or use of iterative reconstruction technique. COMPARISON:  02/17/2023 and earlier studies dating to 01/11/2020 FINDINGS: Lower chest: No acute findings. Hepatobiliary:  No mass visualized on this unenhanced exam. Pancreas: No mass or inflammatory process visualized on this unenhanced exam. Spleen:  Within normal limits in size. Adrenals/Urinary tract: No evidence of urolithiasis or hydronephrosis. Unremarkable unopacified urinary bladder. Stomach/Bowel: Image degradation by motion artifact noted in the lower abdomen and pelvis. No evidence of obstruction, inflammatory process, or abnormal fluid collections. Vascular/Lymphatic: No pathologically enlarged lymph nodes identified. No evidence of abdominal aortic aneurysm. Reproductive: Stable 4.7 cm mass contiguous with the left uterine fundus, consistent with a pedunculated fibroid. No evidence of inflammatory changes or ascites. Other:  None. Musculoskeletal:  No suspicious bone lesions identified. IMPRESSION: No acute findings. Stable pedunculated uterine fibroid. Electronically Signed   By: Danae Orleans M.D.   On: 02/28/2023 13:48   DG Chest Portable 1 View Result Date: 02/28/2023 CLINICAL DATA:  Chest pain. EXAM: PORTABLE CHEST 1 VIEW  COMPARISON:  January 31, 2023. FINDINGS: Stable cardiomediastinal silhouette. Right internal jugular dialysis catheter is unchanged. Both lungs are clear. The visualized skeletal structures are unremarkable. IMPRESSION: No active disease. Electronically Signed   By: Lupita Raider M.D.   On: 02/28/2023 12:32   DG Knee Right Port Result Date: 02/28/2023 CLINICAL DATA:  Amputation assessment. EXAM: PORTABLE RIGHT KNEE - 1-2 VIEW COMPARISON:  May 12, 2022. FINDINGS: Status post right below-knee amputation. Surgical staples are seen in the stomach. Stable degenerative changes seen involving the medial and lateral joint spaces. Stable osteochondroma arising medially from proximal tibia. IMPRESSION: Status post right below-knee amputation. Stable degenerative changes of right knee joint as well as osteo chondroma arising from proximal tibia. Electronically Signed   By: Lupita Raider M.D.   On: 02/28/2023 12:31    Procedures Procedures   Medications Ordered in ED Medications  HYDROmorphone (DILAUDID) injection 1 mg (has no administration in time range)    ED Course/ Medical Decision Making/ A&P    Medical Decision Making Amount and/or Complexity of Data Reviewed Labs: ordered. Radiology: ordered.  Risk Prescription drug management. Decision regarding hospitalization.   57 y.o. female presents to the ER for evaluation of abdominal pain, diarrhea, and stump pain. Differential diagnosis includes but is not limited to AAA, mesenteric ischemia, appendicitis, diverticulitis, DKA, gastroenteritis, nephrolithiasis, pancreatitis, constipation, UTI, bowel obstruction, biliary disease, IBD, PUD, hepatitis, phantom pain, post-op complication. Vital signs show tachycardia, otherwise unremarkable. Physical exam as noted above.   I independently reviewed and interpreted the patient's labs. GI PCR and C diff panel pending. Given the dried lips and tachycardia, I do believe the patient could benefit from  a small amount of fluids.   The patient has had 6+ diarrheal episodes while in the ER. Given recent hospital stay, considered C diff. Cultures are pending. CBC shows leukocytosis with a left shift. It appears the WBC are chronically elevated for the patient. H/H 9.1 and 29.8. Her baseline ~8. Could be some hemoconcentration as well from dehydration. Lipase unremarkable. Initial lactic 2.0. Troponin at 15 which appears to be baseline. CMP shows hypokalemia at 3.0. Elevated chloride at 114. Bicarb of 13, closed gap. Glucose 132. BUN 27 at baseline. Creatinine 5.36, at baseline.  Mildly low calcium. Low albumin. Otherwise no electrolyte or LFT abnromality.  Lactic is minimally elevated. Could be from dehydration as the patient has dry MM and dry cracked lips. Will recheck after fluid bolus.   EKG reviewed and interpreted by my attending and read as Sinus tachycardia Multiple premature complexes, vent & supraven Left anterior fascicular block .   CT ABD shows No acute findings. Stable pedunculated uterine fibroid. Per radiologist's interpretation.    DG knee shows Status post right below-knee amputation. Stable degenerative changes of right knee joint as well as osteo chondroma arising from proximal tibia. Per radiologist's interpretation.   CXR shows No active disease. Per radiologist's interpretation.    On previous chart evaluation, it appears that this patient's been seen multiple times for epigastric pain occasional chest pain.  She has listed gastroparesis.  CT abdomen diarrhea with antiplatelets from today and she is unable to follow-up with information.  Also consider C. difficile given her recent hospital stay.  Was not aware of this and is now having difficulty with IV.  Patient was not able to be syncopal out of the bolus.  IV team has been paged.  Patient has had multiple doses of IV Dilaudid and Protonix and is still complaining of pain.  She reports that she is having sharp shooting pain in  her leg and having pain in her abdomen.  This is the same presentation she had last week.  She has finished the pain medications she was given.  She appears volume down, but no nausea vomiting, I do not think she needs emergent dialysis. She has been receiving antibiotics via dialysis however she was not aware of this.   Will admit patient for intractable pain. Dr. Alinda Money to admit.   Portions of this report may have been transcribed using voice recognition software. Every effort was made to ensure accuracy; however, inadvertent computerized transcription errors may be present.   I discussed this case with my attending physician who cosigned this note including patient's presenting symptoms, physical exam, and planned diagnostics and interventions. Attending physician stated agreement with plan or made changes to plan which were implemented.   Final Clinical Impression(s) / ED Diagnoses Final diagnoses:  Intractable pain  Epigastric pain  Stump pain [facility])    Rx / DC Orders ED Discharge Orders     None         Achille Rich, PA-C 03/01/23 1045    [address], DO 03/01/23 1119

## 2023-02-28 NOTE — ED Notes (Signed)
Lab to come draw blood  

## 2023-02-28 NOTE — ED Notes (Signed)
ED TO INPATIENT HANDOFF REPORT  ED Nurse Name and Phone #: Vernida Mcnicholas [phone]  S Name/Age/Gender Deborah [name] 57 y.o. female Room/Bed: 045C/045C  Code Status   Code Status: Full Code  Home/SNF/Other Home Patient oriented to: self, place, time, and situation Is this baseline? Yes   Triage Complete: Triage complete  Chief Complaint Intractable diarrhea [R19.7]  Triage Note Patient presents to ED via PTAR from home c/o right uipper abd. Painand some chest pain onset 1 hour prior to arrival , states she is a T-TH-Sat dialysis however didn't go to dialysis on sat because she didn't feel good, c/o diarrhea, patient states she puts out very little urine. Had one diarrhea. Right BKA , patient has been on dialysis for 5-6 months cath right upper chest .    Allergies Allergies  Allergen Reactions   Isovue [Iopamidol] Anaphylaxis, [name] and Other (See Comments)    11/28/17 Patient had seizure like activity and then 1 min code after 100 cc of isovue 300. Possible contrast allergy vs vasovagal episode  Cardiac Arrest   Neurontin [Gabapentin] [name] and Swelling   Nsaids Anaphylaxis and Other (See Comments)    Hx of stomach ulcers   Penicillins Itching, Palpitations and Other (See Comments)    Flushing (Red Skin) Laryngeal Edema   Reglan [Metoclopramide] Other (See Comments)    Tardive dyskinesia    Valium [Diazepam] [name]   Zestril [Lisinopril] Anaphylaxis and Swelling    Tongue and mouth swelling Laryngeal Edema   Tolectin [Tolmetin] Nausea And Vomiting, Nausea Only and Other (See Comments)    Irritates stomach ulcer   Asa [Aspirin] Other (See Comments)    Hx of stomach ulcer   Aspartame And Phenylalanine Hives   Bentyl [Dicyclomine] Other (See Comments)    Chest pain   Hibiclens [Chlorhexidine Gluconate] Other (See Comments)    Dermatitis    Flexeril [Cyclobenzaprine] Palpitations   Oxycontin [Oxycodone] Palpitations    Rifamycins Palpitations   Tylenol [Acetaminophen] Nausea And Vomiting, Nausea Only and Other (See Comments)    Irritates stomach ulcer Abdominal pain   Ultram [Tramadol] Nausea And Vomiting and Palpitations    Level of Care/Admitting Diagnosis ED Disposition     ED Disposition  Admit   Condition  --   Comment  Hospital Area: [facility] [[id number]]  Level of Care: Progressive [102]  Admit to Progressive based on following criteria: MULTISYSTEM THREATS such as stable sepsis, metabolic/electrolyte imbalance with or without encephalopathy that is responding to early treatment.  Admit to Progressive based on following criteria: Other see comments  Comments: Near hypotension in ED. Tachy cardia. Close montioring.  May place patient in observation at [facility] or [facility] if equivalent level of care is available:: No  Covid Evaluation: Asymptomatic - no recent exposure (last 10 days) testing not required  Diagnosis: Intractable diarrhea [[id number]]  Admitting Physician: Synetta Fail [[id number]]  Attending Physician: Synetta Fail [[id number]]          B Medical/Surgery History Past Medical History:  Diagnosis Date   Acute back pain with sciatica, left    Acute back pain with sciatica, right    Acute encephalopathy 05/29/2022   Acute osteomyelitis of right calcaneus (HCC) 12/21/2022   AKI (acute kidney injury) (HCC)    Anemia, unspecified    Atrial fibrillation with RVR (HCC)-resolved 09/09/2022   Atypical chest pain 09/10/2021   Cancer (HCC)    Carcinoid tumor of duodenum    Chest  pain with normal coronary angiography 2019   Chronic a-fib (HCC) 09/09/2022   Chronic kidney disease, stage 3b (HCC)    Chronic pain    Chronic systolic CHF (congestive heart failure) (HCC)    Dehiscence of amputation stump of right lower extremity (HCC) 01/29/2023   Diabetes mellitus    DKA (diabetic ketoacidosis) (HCC)    Drug-seeking behavior    21 hospitalizations  and 14 CT a/p in 2 years for N/V and abdominal pain, demanding only IV dilaudid   Elevated troponin    chronic   Esophageal reflux    Fibromyalgia    Gastric ulcer    Gastroparesis    Gout    HCAP (healthcare-associated pneumonia) 06/19/2022   Hyperlipidemia    Hyperosmolar hyperglycemic state (HHS) (HCC) 05/11/2022   Hyperosmolar non-ketotic state due to type 2 diabetes mellitus (HCC) 05/11/2022   Hypertension    Hypokalemia    Hypomagnesemia    Lumbosacral stenosis    LVH (left ventricular hypertrophy)    Morbid obesity (HCC)    Nausea & vomiting 09/09/2022   NICM (nonischemic cardiomyopathy) (HCC)    PAF (paroxysmal atrial fibrillation) (HCC)    Sepsis (HCC) 11/23/2022   Stroke (HCC) 02/2011   Symptomatic anemia 12/14/2022   Thrombocytosis    Vitamin B12 deficiency anemia    Past Surgical History:  Procedure Laterality Date   ABDOMINAL AORTOGRAM W/LOWER EXTREMITY N/A 11/29/2022   Procedure: ABDOMINAL AORTOGRAM W/LOWER EXTREMITY;  Surgeon: Daria Pastures, MD;  Location: [facility];  Service: Cardiovascular;  Laterality: N/A;   AMPUTATION Right 12/29/2022   Procedure: RIGHT BELOW KNEE AMPUTATION;  Surgeon: Nadara Mustard, MD;  Location: [facility] OR;  Service: Orthopedics;  Laterality: Right;   AV FISTULA PLACEMENT Left 06/30/2022   Procedure: LEFT BRACHIOCEPHALIC ARTERIOVENOUS (AV) FISTULA CREATION;  Surgeon: Cephus Shelling, MD;  Location: [facility] OR;  Service: Vascular;  Laterality: Left;   BIOPSY  07/27/2019   Procedure: BIOPSY;  Surgeon: Vida Rigger, MD;  Location: [facility] ENDOSCOPY;  Service: Endoscopy;;   BIOPSY  07/30/2019   Procedure: BIOPSY;  Surgeon: Kathi Der, MD;  Location: [facility] ENDOSCOPY;  Service: Gastroenterology;;   CATARACT EXTRACTION  01/2014   CHOLECYSTECTOMY     COLONOSCOPY WITH PROPOFOL N/A 07/30/2019   Procedure: COLONOSCOPY WITH PROPOFOL;  Surgeon: Kathi Der, MD;  Location: [facility] ENDOSCOPY;  Service: Gastroenterology;  Laterality: N/A;    ESOPHAGOGASTRODUODENOSCOPY N/A 07/27/2019   Procedure: ESOPHAGOGASTRODUODENOSCOPY (EGD);  Surgeon: Vida Rigger, MD;  Location: [facility];  Service: Endoscopy;  Laterality: N/A;   ESOPHAGOGASTRODUODENOSCOPY N/A 07/26/2020   Procedure: ESOPHAGOGASTRODUODENOSCOPY (EGD);  Surgeon: Willis Modena, MD;  Location: [facility]OSCOPY;  Service: Endoscopy;  Laterality: N/A;   ESOPHAGOGASTRODUODENOSCOPY (EGD) WITH PROPOFOL N/A 08/02/2019   Procedure: ESOPHAGOGASTRODUODENOSCOPY (EGD) WITH PROPOFOL;  Surgeon: Kathi Der, MD;  Location: [facility]OS[facility];  Service: Gastroenterology;  Laterality: N/A;   ESOPHAGOGASTRODUODENOSCOPY (EGD) WITH PROPOFOL N/A 12/23/2022   Procedure: ESOPHAGOGASTRODUODENOSCOPY (EGD) WITH PROPOFOL;  Surgeon: Shellia Cleverly, DO;  Location: [facility] ENDOSCOPY;  Service: Gastroenterology;  Laterality: N/A;   FISTULA SUPERFICIALIZATION Left 12/31/2022   Procedure: LEFT ARM FISTULA TRANSPOSITION;  Surgeon: Maeola Harman, MD;  Location: [facility] OR;  Service: Vascular;  Laterality: Left;   GIVENS CAPSULE STUDY N/A 12/23/2022   Procedure: GIVENS CAPSULE STUDY;  Surgeon: Shellia Cleverly, DO;  Location: [facility] ENDOSCOPY;  Service: Gastroenterology;  Laterality: N/A;   HEMOSTASIS CLIP PLACEMENT  08/02/2019   Procedure: HEMOSTASIS CLIP PLACEMENT;  Surgeon: Kathi Der, MD;  Location: [facility] ENDOSCOPY;  Service: Gastroenterology;;   HOT HEMOSTASIS N/A 12/23/2022   Procedure: HOT HEMOSTASIS (ARGON PLASMA COAGULATION/BICAP);  Surgeon: Shellia Cleverly, DO;  Location[facility] ENDOSCOPY;  Service: Gastroenterology;  Laterality: N/A;   IR FLUORO GUIDE CV LINE RIGHT  06/24/2022   IR US GUIDE VASC ACCESS RIGHT  06/24/2022   POLYPECTOMY  07/30/2019   Procedure: POLYPECTOMY;  Surgeon: Kathi Der, MD;  Location: [facility] ENDOS[facility];  Service: Gastroenterology;;   POLYPECTOMY  08/02/2019   Procedure: POLYPECTOMY;  Surgeon: Kathi Der, MD;  Location: [facility] ENDOSCOPY;  Service: Gastroenterology;;   STUMP  REVISION Right 02/02/2023   Procedure: REVISION RIGHT BELOW KNEE AMPUTATION;  Surgeon: Nadara Mustard, MD;  Location: [facility] OR;  Service: Orthopedics;  Laterality: Right;     A IV Location/Drains/Wounds Patient Lines/Drains/Airways Status     Active Line/Drains/Airways     Name Placement date Placement time Site Days   Peripheral IV 02/28/23 20 G Right Antecubital 02/28/23  1129  Antecubital  less than 1   Fistula / Graft Left Upper arm Arteriovenous fistula 06/30/22  1200  Upper arm  243   Hemodialysis Catheter Right Subclavian Double lumen Permanent (Tunneled) 06/24/22  1644  Subclavian  249   Pressure Injury 11/23/22 Ischial tuberosity Left Unstageable - Full thickness tissue loss in which the base of the injury is covered by slough (yellow, tan, gray, green or brown) and/or eschar (tan, brown or black) in the wound bed. 11/23/22  0853  -- 97   Pressure Injury 11/24/22 Ischial tuberosity Right Stage 2 -  Partial thickness loss of dermis presenting as a shallow open injury with a red, pink wound bed without slough. stage two injury to upper thigh into groin region. 11/24/22  1452  -- 96   Wound / Incision (Open or Dehisced) 06/19/22 Skin tear Sacrum Mid stage 2 small abraison present on admission 06/19/22  1840  Sacrum  254   Wound / Incision (Open or Dehisced) 06/19/22 Heel Left 06/19/22  2100  Heel  254   Wound / Incision (Open or Dehisced) 01/29/23 Amputation Leg Right 01/29/23  2000  Leg  30            Intake/Output Last 24 hours No intake or output data in the 24 hours ending 02/28/23 1633  Labs/Imaging Results for orders placed or performed during the hospital encounter of 02/28/23 (from the past 48 hours)  CBG monitoring, ED     Status: Abnormal   Collection Time: 02/28/23 10:16 AM  Result Value Ref Range   Glucose-Capillary 113 (H) 70 - 99 mg/dL    Comment: Glucose reference range applies only to samples taken after fasting for at least 8 hours.   Comment 1 Notify RN     Comment 2 Document in Chart   C Difficile Quick Screen w PCR reflex     Status: None   Collection Time: 02/28/23 11:16 AM   Specimen: Stool  Result Value Ref Range   C Diff antigen NEGATIVE NEGATIVE   C Diff toxin NEGATIVE NEGATIVE   C Diff interpretation No C. difficile detected.     Comment: Performed at [facility] Lab, [address] [place], [place] [address]  CBC with Differential     Status: Abnormal   Collection Time: 02/28/23 11:29 AM  Result Value Ref Range   WBC 17.2 (H) 4.0 - 10.5 K/uL   RBC 3.10 (L) 3.87 - 5.11 MIL/uL   Hemoglobin 9.1 (L) 12.0 - 15.0 g/dL   HCT 60.4 (L) 54.0 -  46.0 %   MCV 96.1 80.0 - 100.0 fL   MCH 29.4 26.0 - 34.0 pg   MCHC 30.5 30.0 - 36.0 g/dL   RDW 16.1 (H) 09.6 - 04.5 %   Platelets 423 (H) 150 - 400 K/uL   nRBC 0.0 0.0 - 0.2 %   Neutrophils Relative % 75 %   Neutro Abs 12.9 (H) 1.7 - 7.7 K/uL   Lymphocytes Relative 16 %   Lymphs Abs 2.8 0.7 - 4.0 K/uL   Monocytes Relative 5 %   Monocytes Absolute 0.8 0.1 - 1.0 K/uL   Eosinophils Relative 2 %   Eosinophils Absolute 0.4 0.0 - 0.5 K/uL   Basophils Relative 1 %   Basophils Absolute 0.1 0.0 - 0.1 K/uL   Immature Granulocytes 1 %   Abs Immature Granulocytes 0.14 (H) 0.00 - 0.07 K/uL    Comment: Performed at [facility], [address] [place], [place] [address]  Comprehensive metabolic panel     Status: Abnormal   Collection Time: 02/28/23 11:29 AM  Result Value Ref Range   Sodium 141 135 - 145 mmol/L   Potassium 3.0 (L) 3.5 - 5.1 mmol/L   Chloride 114 (H) 98 - 111 mmol/L   CO2 13 (L) 22 - 32 mmol/L   Glucose, Bld 132 (H) 70 - 99 mg/dL    Comment: Glucose reference range applies only to samples taken after fasting for at least 8 hours.   BUN 27 (H) 6 - 20 mg/dL   Creatinine, Ser 1.91 (H) 0.44 - 1.00 mg/dL   Calcium 7.9 (L) 8.9 - 10.3 mg/dL   Total Protein 7.0 6.5 - 8.1 g/dL   Albumin 2.5 (L) 3.5 - 5.0 g/dL   AST 26 15 - 41 U/L   ALT 14 0 - 44 U/L   Alkaline Phosphatase 73 38  - 126 U/L   Total Bilirubin 0.6 <1.2 mg/dL   GFR, Estimated 9 (L) >60 mL/min    Comment: (NOTE) Calculated using the CKD-EPI Creatinine Equation (2021)    Anion gap 14 5 - 15    Comment: Performed at [facility], [address] [place], [place] [address]  Lipase, blood     Status: None   Collection Time: 02/28/23 11:29 AM  Result Value Ref Range   Lipase 40 11 - 51 U/L    Comment: Performed at [facility], [address] [place], [place] [address]  Troponin I (High Sensitivity)     Status: None   Collection Time: 02/28/23 11:29 AM  Result Value Ref Range   Troponin I (High Sensitivity) 15 <18 ng/L    Comment: (NOTE) Elevated high sensitivity troponin I (hsTnI) values and significant  changes across serial measurements may suggest ACS but many other  chronic and acute conditions are known to elevate hsTnI results.  Refer to the "Links" section for chest pain algorithms and additional  guidance. Performed at [facility], [address] [place], [place] [address]   Lactic acid, plasma     Status: Abnormal   Collection Time: 02/28/23 11:29 AM  Result Value Ref Range   Lactic Acid, Venous 2.0 (HH) 0.5 - 1.9 mmol/L    Comment: CRITICAL RESULT CALLED TO, READ BACK BY AND VERIFIED WITH T. Shropshire RN , @1216 , 02/28/23, [place]ab[place], T. Performed at [facility], [address], [place], [place] [address]   I-stat chem 8, ED (not at [facility], [facility] or [facility])  Status: Abnormal   Collection Time: 02/28/23 11:39 AM  Result Value Ref Range   Sodium 144 135 - 145 mmol/L   Potassium 3.0 (L) 3.5 - 5.1 mmol/L   Chloride 114 (H) 98 - 111 mmol/L   BUN 31 (H) 6 - 20 mg/dL   Creatinine, Ser 1.61 (H) 0.44 - 1.00 mg/dL   Glucose, Bld 096 (H) 70 - 99 mg/dL    Comment: Glucose reference range applies only to samples taken after fasting for at least 8 hours.   Calcium, Ion 0.96 (L) 1.15 - 1.40 mmol/L   TCO2 15 (L) 22 - 32 mmol/L   Hemoglobin 9.9 (L) 12.0 - 15.0 g/dL   HCT  04.5 (L) 40.9 - 46.0 %   CT ABDOMEN PELVIS WO CONTRAST Result Date: 02/28/2023 CLINICAL DATA:  Right-sided abdominal pain. EXAM: CT ABDOMEN AND PELVIS WITHOUT CONTRAST TECHNIQUE: Multidetector CT imaging of the abdomen and pelvis was performed following the standard protocol without IV contrast. RADIATION DOSE REDUCTION: This exam was performed according to the departmental dose-optimization program which includes automated exposure control, adjustment of the mA and/or kV according to patient size and/or use of iterative reconstruction technique. COMPARISON:  02/17/2023 and earlier studies dating to 01/11/2020 FINDINGS: Lower chest: No acute findings. Hepatobiliary:  No mass visualized on this unenhanced exam. Pancreas: No mass or inflammatory process visualized on this unenhanced exam. Spleen:  Within normal limits in size. Adrenals/Urinary tract: No evidence of urolithiasis or hydronephrosis. Unremarkable unopacified urinary bladder. Stomach/Bowel: Image degradation by motion artifact noted in the lower abdomen and pelvis. No evidence of obstruction, inflammatory process, or abnormal fluid collections. Vascular/Lymphatic: No pathologically enlarged lymph nodes identified. No evidence of abdominal aortic aneurysm. Reproductive: Stable 4.7 cm mass contiguous with the left uterine fundus, consistent with a pedunculated fibroid. No evidence of inflammatory changes or ascites. Other:  None. Musculoskeletal:  No suspicious bone lesions identified. IMPRESSION: No acute findings. Stable pedunculated uterine fibroid. Electronically Signed   By: Danae Orleans M.D.   On: 02/28/2023 13:48   DG Chest Portable 1 View Result Date: 02/28/2023 CLINICAL DATA:  Chest pain. EXAM: PORTABLE CHEST 1 VIEW COMPARISON:  January 31, 2023. FINDINGS: Stable cardiomediastinal silhouette. Right internal jugular dialysis catheter is unchanged. Both lungs are clear. The visualized skeletal structures are unremarkable. IMPRESSION: No  active disease. Electronically Signed   By: Lupita Raider M.D.   On: 02/28/2023 12:32   DG Knee Right Port Result Date: 02/28/2023 CLINICAL DATA:  Amputation assessment. EXAM: PORTABLE RIGHT KNEE - 1-2 VIEW COMPARISON:  May 12, 2022. FINDINGS: Status post right below-knee amputation. Surgical staples are seen in the stomach. Stable degenerative changes seen involving the medial and lateral joint spaces. Stable osteochondroma arising medially from proximal tibia. IMPRESSION: Status post right below-knee amputation. Stable degenerative changes of right knee joint as well as osteo chondroma arising from proximal tibia. Electronically Signed   By: Lupita Raider M.D.   On: 02/28/2023 12:31    Pending Labs Unresulted Labs (From admission, onward)     Start     Ordered   03/01/23 0500  Comprehensive metabolic panel  Tomorrow morning,   R        02/28/23 1533   03/01/23 0500  CBC  Tomorrow morning,   R        02/28/23 1533   02/28/23 1445  Urinalysis, Routine w reflex microscopic -Urine, Clean Catch  Once,   URGENT       Question:  Specimen Source  Answer:  Urine, Clean Catch   02/28/23 1444   02/28/23 1123  Gastrointestinal Panel by PCR , Stool  (Gastrointestinal Panel by PCR, Stool                                                                                                                                                     **Does Not include CLOSTRIDIUM DIFFICILE testing. **If CDIFF testing is needed, place order from the "C Difficile Testing" order set.**)  Once,   URGENT        02/28/23 1122   02/28/23 1100  Lactic acid, plasma  (Lactic Acid)  Now then every 2 hours,   R (with STAT occurrences)      02/28/23 1059            Vitals/Pain Today's Vitals   02/28/23 1330 02/28/23 1350 02/28/23 1400 02/28/23 1600  BP:  (!) 101/45 117/83 95/83  Pulse: (!) 119 (!) 119 (!) 119 (!) 111  Resp: 18 (!) 28 16 14   Temp:    (!) 97.3 F (36.3 C)  TempSrc:    Oral  SpO2: 100% 99% 93% 100%   Weight:      Height:      PainSc:        Isolation Precautions Enteric precautions (UV disinfection)  Medications Medications  allopurinol (ZYLOPRIM) tablet 100 mg (has no administration in time range)  atorvastatin (LIPITOR) tablet 10 mg (has no administration in time range)  metoprolol tartrate (LOPRESSOR) tablet 50 mg (has no administration in time range)  hydrOXYzine (ATARAX) tablet 25 mg (has no administration in time range)  busPIRone (BUSPAR) tablet 5 mg (has no administration in time range)  mirtazapine (REMERON) tablet 15 mg (has no administration in time range)  pantoprazole (PROTONIX) EC tablet 40 mg (has no administration in time range)  cefadroxil (DURICEF) capsule 500 mg (has no administration in time range)  doxycycline (VIBRA-TABS) tablet 100 mg (has no administration in time range)  metroNIDAZOLE (FLAGYL) tablet 500 mg (has no administration in time range)  heparin injection 5,000 Units (has no administration in time range)  sodium chloride flush (NS) 0.9 % injection 3 mL (has no administration in time range)  acetaminophen (TYLENOL) tablet 650 mg (has no administration in time range)    Or  acetaminophen (TYLENOL) suppository 650 mg (has no administration in time range)  polyethylene glycol (MIRALAX / GLYCOLAX) packet 17 g (has no administration in time range)  gabapentin (NEURONTIN) capsule 100 mg (has no administration in time range)  HYDROmorphone (DILAUDID) injection 0.5 mg (has no administration in time range)    Followed by  HYDROmorphone (DILAUDID) injection 0.5 mg (has no administration in time range)  HYDROmorphone (DILAUDID) tablet 2 mg (has no administration in time range)  insulin aspart (novoLOG) injection 0-6 Units (has no administration in time range)  HYDROmorphone (DILAUDID) injection 1  mg (1 mg Intravenous Given 02/28/23 1139)  pantoprazole (PROTONIX) injection 40 mg (40 mg Intravenous Given 02/28/23 1141)  sodium chloride 0.9 % bolus 500 mL  (500 mLs Intravenous New Bag/Given 02/28/23 1148)  HYDROmorphone (DILAUDID) injection 0.5 mg (0.5 mg Intravenous Given 02/28/23 1448)    Mobility walks with device      Focused Assessments Cardiac Assessment Handoff:  Cardiac Rhythm: Sinus tachycardia Lab Results  Component Value Date   CKTOTAL 140 05/11/2022   CKMB 3.0 03/24/2010   TROPONINI <0.03 07/17/2018   Lab Results  Component Value Date   DDIMER 0.47 08/03/2021   Does the Patient currently have chest pain? No    R Recommendations: See Admitting Provider Note  Report given to:   Additional Notes: patient having persistent pain

## 2023-02-28 NOTE — ED Notes (Signed)
I was unsuccessful with trying to get blood out of pt's IV. I notified phlebotomy to obtain blood and they said they will try.

## 2023-02-28 NOTE — ED Triage Notes (Signed)
Patient presents to ED via PTAR from home c/o right uipper abd. Painand some chest pain onset 1 hour prior to arrival , states she is a T-TH-Sat dialysis however didn't go to dialysis on sat because she didn't feel good, c/o diarrhea, patient states she puts out very little urine. Had one diarrhea. Right BKA , patient has been on dialysis for 5-6 months cath right upper chest .

## 2023-02-28 NOTE — H&P (Signed)
History and Physical   Deborah [name] ZOX:[id number] DOB: [birth year] DOA: 02/28/2023  PCP: Pcp, No   Patient coming from: Home  Chief Complaint: Abdominal pain, diarrhea  HPI: Deborah [name] is a 57 y.o. female with medical history significant of hypertension, diabetes, neuropathy, gastroparesis, GERD, anemia, paroxysmal atrial fibrillation, ESRD on HD, CHF, gout, duodenal carcinoid tumor, status post BKA on the right, history of gastric AVM, NASH, tardive dyskinesia, depression, anxiety, obesity, restrictive lung disease, spinal stenosis, chronic pain presenting with abdominal pain and diarrhea.  Patient has had recurrence of diarrhea.  She has had this in the past and has undergone workup ordered outpatient.  Currently is following with GI providers here and out of state.  Appears, per chart review, that she is under consideration for somatostatin injections concerning history of carcinoid tumor.  Also reporting she has run out of her pain medication and has not seen her PCP for refill.  In the setting she is reporting significant epigastric pain as well as pain at her right BKA stump.  Also pain in her sacrum where she has some superficial sacral wounds.  Of note, also remains on chronic antibiotics after issues with her stump becoming infected and needing revision.  Had been on IV antibiotics with dialysis in addition to Flagyl and transition to doxycycline, cefadroxil, Flagyl on 12/18 which she remains on.  No evidence of infection at stump.  Additionally reports nausea and vomiting.  Denies fevers, chills, chest pain, shortness of breath.  ED Course: Since the ED notable for heart rate in the 110s to 120s, blood pressure in the 90s to 130s systolic, respiratory rate in the teens to 20s.  Lab workup included CMP with potassium 3.0, chloride 114, bicarb 13, BUN 27, creatinine 5.36, calcium 7.9 which improved considering albumin of 2.5.  CBC with chronic leukocytosis at 17.2,  hemoglobin stable at 9.1, platelets stably elevated at 423.  Initial lactic acid borderline at 2.0 and repeat pending.  Troponin negative with repeat pending.  Lipase normal.  GI pathogen panel pending, C. difficile panel pending.  Urinalysis pending.  Imaging studies included chest x-ray which showed no acute abnormality.  Right knee x-ray which showed patient status post right BKA and stable degenerative changes.  CT of the abdomen pelvis showed stable fibroids and no acute normality.  Patient received Dilaudid, PPI, 500 cc IV fluid in the ED.  Review of Systems: As per HPI otherwise all other systems reviewed and are negative.  Past Medical History:  Diagnosis Date   Acute back pain with sciatica, left    Acute back pain with sciatica, right    Acute encephalopathy 05/29/2022   Acute osteomyelitis of right calcaneus (HCC) 12/21/2022   AKI (acute kidney injury) (HCC)    Anemia, unspecified    Atrial fibrillation with RVR (HCC)-resolved 09/09/2022   Atypical chest pain 09/10/2021   Cancer (HCC)    Carcinoid tumor of duodenum    Chest pain with normal coronary angiography 2019   Chronic a-fib (HCC) 09/09/2022   Chronic kidney disease, stage 3b (HCC)    Chronic pain    Chronic systolic CHF (congestive heart failure) (HCC)    Dehiscence of amputation stump of right lower extremity (HCC) 01/29/2023   Diabetes mellitus    DKA (diabetic ketoacidosis) (HCC)    Drug-seeking behavior    21 hospitalizations and 14 CT a/p in 2 years for N/V and abdominal pain, demanding only IV dilaudid   Elevated troponin    chronic  Esophageal reflux    Fibromyalgia    Gastric ulcer    Gastroparesis    Gout    HCAP (healthcare-associated pneumonia) 06/19/2022   Hyperlipidemia    Hyperosmolar hyperglycemic state (HHS) (HCC) 05/11/2022   Hyperosmolar non-ketotic state due to type 2 diabetes mellitus (HCC) 05/11/2022   Hypertension    Hypokalemia    Hypomagnesemia    Lumbosacral stenosis    LVH (left  ventricular hypertrophy)    Morbid obesity (HCC)    Nausea & vomiting 09/09/2022   NICM (nonischemic cardiomyopathy) (HCC)    PAF (paroxysmal atrial fibrillation) (HCC)    Sepsis (HCC) 11/23/2022   Stroke (HCC) 02/2011   Symptomatic anemia 12/14/2022   Thrombocytosis    Vitamin B12 deficiency anemia     Past Surgical History:  Procedure Laterality Date   ABDOMINAL AORTOGRAM W/LOWER EXTREMITY N/A 11/29/2022   Procedure: ABDOMINAL AORTOGRAM W/LOWER EXTREMITY;  Surgeon: Daria Pastures, MD;  Location: [facility];  Service: Cardiovascular;  Laterality: N/A;   AMPUTATION Right 12/29/2022   Procedure: RIGHT BELOW KNEE AMPUTATION;  Surgeon: Nadara Mustard, MD;  Location: [facility] OR;  Service: Orthopedics;  Laterality: Right;   AV FISTULA PLACEMENT Left 06/30/2022   Procedure: LEFT BRACHIOCEPHALIC ARTERIOVENOUS (AV) FISTULA CREATION;  Surgeon: Cephus Shelling, MD;  Location: [facility] OR;  Service: Vascular;  Laterality: Left;   BIOPSY  07/27/2019   Procedure: BIOPSY;  Surgeon: Vida Rigger, MD;  Location: [facility];  Service: Endoscopy;;   BIOPSY  07/30/2019   Procedure: BIOPSY;  Surgeon: Kathi Der, MD;  Location: [facility];  Service: Gastroenterology;;   CATARACT EXTRACTION  01/2014   CHOLECYSTECTOMY     COLONOSCOPY WITH PROPOFOL N/A 07/30/2019   Procedure: COLONOSCOPY WITH PROPOFOL;  Surgeon: Kathi Der, MD;  Location: [facility];  Service: Gastroenterology;  Laterality: N/A;   ESOPHAGOGASTRODUODENOSCOPY N/A 07/27/2019   Procedure: ESOPHAGOGASTRODUODENOSCOPY (EGD);  Surgeon: Vida Rigger, MD;  Location: [facility];  Service: Endoscopy;  Laterality: N/A;   ESOPHAGOGASTRODUODENOSCOPY N/A 07/26/2020   Procedure: ESOPHAGOGASTRODUODENOSCOPY (EGD);  Surgeon: Willis Modena, MD;  Location: [facility];  Service: Endoscopy;  Laterality: N/A;   ESOPHAGOGASTRODUODENOSCOPY (EGD) WITH PROPOFOL N/A 08/02/2019   Procedure: ESOPHAGOGASTRODUODENOSCOPY (EGD) WITH PROPOFOL;  Surgeon:  Kathi Der, MD;  Location: [facility];  Service: Gastroenterology;  Laterality: N/A;   ESOPHAGOGASTRODUODENOSCOPY (EGD) WITH PROPOFOL N/A 12/23/2022   Procedure: ESOPHAGOGASTRODUODENOSCOPY (EGD) WITH PROPOFOL;  Surgeon: Shellia Cleverly, DO;  Location: [facility];  Service: Gastroenterology;  Laterality: N/A;   FISTULA SUPERFICIALIZATION Left 12/31/2022   Procedure: LEFT ARM FISTULA TRANSPOSITION;  Surgeon: Maeola Harman, MD;  Location: [facility] OR;  Service: Vascular;  Laterality: Left;   GIVENS CAPSULE STUDY N/A 12/23/2022   Procedure: GIVENS CAPSULE STUDY;  Surgeon: Shellia Cleverly, DO;  Location: [facility];  Service: Gastroenterology;  Laterality: N/A;   HEMOSTASIS CLIP PLACEMENT  08/02/2019   Procedure: HEMOSTASIS CLIP PLACEMENT;  Surgeon: Kathi Der, MD;  Location: [facility];  Service: Gastroenterology;;   HOT HEMOSTASIS N/A 12/23/2022   Procedure: HOT HEMOSTASIS (ARGON PLASMA COAGULATION/BICAP);  Surgeon: Shellia Cleverly, DO;  Location: [facility] ENDOSCOPY;  Service: Gastroenterology;  Laterality: N/A;   IR FLUORO GUIDE CV LINE RIGHT  06/24/2022   IR US GUIDE VASC ACCESS RIGHT  06/24/2022   POLYPECTOMY  07/30/2019   Procedure: POLYPECTOMY;  Surgeon: Kathi Der, MD;  Location: [facility];  Service: Gastroenterology;;   POLYPECTOMY  08/02/2019   Procedure: POLYPECTOMY;  Surgeon: Kathi Der, MD;  Location: [facility];  Service: Gastroenterology;;  STUMP REVISION Right 02/02/2023   Procedure: REVISION RIGHT BELOW KNEE AMPUTATION;  Surgeon: Nadara Mustard, MD;  Location: [facility] OR;  Service: Orthopedics;  Laterality: Right;    Social History  reports that she has never smoked. She has never used smokeless tobacco. She reports that she does not drink alcohol and does not use drugs.  Allergies  Allergen Reactions   Isovue [Iopamidol] Anaphylaxis, [name] and Other (See Comments)    11/28/17 Patient had seizure like activity and then 1 min  code after 100 cc of isovue 300. Possible contrast allergy vs vasovagal episode  Cardiac Arrest   Neurontin [Gabapentin] [name] and Swelling   Nsaids Anaphylaxis and Other (See Comments)    Hx of stomach ulcers   Penicillins Itching, Palpitations and Other (See Comments)    Flushing (Red Skin) Laryngeal Edema   Reglan [Metoclopramide] Other (See Comments)    Tardive dyskinesia    Valium [Diazepam] [name]   Zestril [Lisinopril] Anaphylaxis and Swelling    Tongue and mouth swelling Laryngeal Edema   Tolectin [Tolmetin] Nausea And Vomiting, Nausea Only and Other (See Comments)    Irritates stomach ulcer   Asa [Aspirin] Other (See Comments)    Hx of stomach ulcer   Aspartame And Phenylalanine Hives   Bentyl [Dicyclomine] Other (See Comments)    Chest pain   Hibiclens [Chlorhexidine Gluconate] Other (See Comments)    Dermatitis    Flexeril [Cyclobenzaprine] Palpitations   Oxycontin [Oxycodone] Palpitations   Rifamycins Palpitations   Tylenol [Acetaminophen] Nausea And Vomiting, Nausea Only and Other (See Comments)    Irritates stomach ulcer Abdominal pain   Ultram [Tramadol] Nausea And Vomiting and Palpitations    Family History  Problem Relation Age of Onset   Diabetes Mother    Diabetes Father    Heart disease Father    Diabetes Sister    Congestive Heart Failure Sister 73   Diabetes Brother   Reviewed on admission  Prior to Admission medications   Medication Sig Start Date End Date Taking? Authorizing Provider  albuterol (PROVENTIL) (2.5 MG/3ML) 0.083% nebulizer solution Take 3 mLs (2.5 mg total) by nebulization every 6 (six) hours as needed for wheezing or shortness of breath. 04/06/19   Bing Neighbors, NP  allopurinol (ZYLOPRIM) 100 MG tablet Take 1 tablet (100 mg total) by mouth 2 (two) times daily. 11/30/22   Azucena Fallen, MD  Amino Acids-Protein Hydrolys (FEEDING SUPPLEMENT, PRO-STAT SUGAR FREE 64,) LIQD Take 30 mLs by mouth 2  (two) times daily between meals.    [provider]  ascorbic acid (VITAMIN C) 500 MG tablet Take 500 mg by mouth daily.    [provider]  atorvastatin (LIPITOR) 10 MG tablet Take 10 mg by mouth every evening. 12/03/21   [provider]  busPIRone (BUSPAR) 5 MG tablet Take 5 mg by mouth 3 (three) times daily.    [provider]  cefadroxil (DURICEF) 500 MG capsule Take 1 capsule (500 mg total) by mouth daily for 14 days. 02/23/23   Leroy Sea, MD  cefTAZidime 0.5 g in dextrose 5 % 50 mL Inject 0.5 g into the vein daily. 02/15/23   Leroy Sea, MD  cetirizine (ZYRTEC) 10 MG tablet Take 10 mg by mouth daily. 08/05/22   [provider]  colchicine 0.6 MG tablet Take 0.5 tablets (0.3 mg total) by mouth 2 (two) times a week. Patient not taking: Reported on 01/29/2023 12/02/22   Carma Leaven  C, MD  doxycycline (VIBRA-TABS) 100 MG tablet Take 1 tablet (100 mg total) by mouth 2 (two) times daily. 02/23/23   Leroy Sea, MD  famotidine (PEPCID) 20 MG tablet Take 20 mg by mouth daily before breakfast.    [provider]  ferric citrate (AURYXIA) 1 GM 210 MG(Fe) tablet Take 2 tablets (420 mg total) by mouth 3 (three) times daily with meals. Patient taking differently: Take 210 mg by mouth 3 (three) times daily with meals. 11/30/22   Azucena Fallen, MD  fluticasone [facility]) 50 MCG/ACT nasal spray Place 2 sprays into both nostrils daily as needed for allergies or rhinitis. 12/19/18   Rai, Delene Ruffini, MD  folic acid (FOLVITE) 1 MG tablet Take 1 tablet (1 mg total) by mouth daily. 01/10/23   Danford, Earl Lites, MD  gabapentin (NEURONTIN) 100 MG capsule Take 1 capsule (100 mg total) by mouth 3 (three) times daily. 11/30/22   Azucena Fallen, MD  HYDROmorphone (DILAUDID) 4 MG tablet Take 1 tablet (4 mg total) by mouth See admin instructions. Give 4 mg (1 tablet) three times daily for pain. Give first dose at 0600 on dialysis  days (T-Th-Sat), otherwise give first dose at 0900. May give an additional tablet every 24 hours as needed for severe pain (do not administer within 3 hours of scheduled dose). 02/24/23   Nadara Mustard, MD  hydrOXYzine (ATARAX) 25 MG tablet Take 1 tablet (25 mg total) by mouth 3 (three) times daily as needed for anxiety. Patient taking differently: Take 25 mg by mouth See admin instructions. Give 25 mg (1 tablet) every 6 hours as needed for anxiety, agitation for 14 days (starting 11/19), then every 8 hours as needed. 01/10/23   Danford, Earl Lites, MD  insulin glargine (SEMGLEE, YFGN,) 100 UNIT/ML Solostar Pen Inject 8 Units into the skin at bedtime.    [provider]  insulin lispro (HUMALOG) 100 UNIT/ML KwikPen Before each meal 3 times a day, 140-199 - 2 units, 200-250 - 6 units, 251-299 - 8 units,  300-349 - 12 units,  350 or above 14 units. Patient taking differently: Inject 0-14 Units into the skin See admin instructions. Inject 0-14 units per sliding scale before meals: < 70 notify MD 70-139 : 0 units 140-199 : 2 units 200-250 : 6 units 251-299 : 8 units 300-349 : 12 units 350-400 : 14 units > 401 notify MD. 06/04/22   Leroy Sea, MD  loperamide (IMODIUM) 2 MG capsule Take 1 capsule (2 mg total) by mouth as needed for diarrhea or loose stools. Patient taking differently: Take 2 mg by mouth daily as needed for diarrhea or loose stools. 01/10/23   Danford, Earl Lites, MD  methocarbamol (ROBAXIN) 500 MG tablet Take 1 tablet (500 mg total) by mouth every 6 (six) hours as needed for muscle spasms. 01/10/23   Danford, Earl Lites, MD  metoprolol tartrate (LOPRESSOR) 50 MG tablet Take 1 tablet (50 mg total) by mouth 2 (two) times daily. 01/10/23   Danford, Earl Lites, MD  metroNIDAZOLE (FLAGYL) 500 MG tablet Take 1 tablet (500 mg total) by mouth every 12 (twelve) hours. 02/15/23   Leroy Sea, MD  mirtazapine (REMERON) 15 MG tablet Take 15 mg by mouth at bedtime.  10/18/22   [provider]  Multiple Vitamins-Minerals (SENIOR MULTIVITAMIN PLUS PO) Take 1 tablet by mouth daily.    [provider]  nutrition supplement, JUVEN, (JUVEN) PACK Take 1 packet by mouth 2 (two) times daily  between meals. 01/10/23   Danford, Earl Lites, MD  Nutritional Supplement LIQD Take 120-237 mLs by mouth See admin instructions. House supplement. Give 120 mL in the morning at 0900 and 237 mL twice daily between meals (1000, 1400)    [provider]  Nutritional Supplements (,FEEDING SUPPLEMENT, PROSOURCE PLUS) liquid Take 30 mLs by mouth 2 (two) times daily between meals. 01/10/23   Danford, Earl Lites, MD  pantoprazole (PROTONIX) 40 MG tablet Take 1 tablet (40 mg total) by mouth 2 (two) times daily. 02/18/23   Horton Chin, MD  vancomycin (VANCOCIN) 1-5 GM/200ML-% SOLN Inject 200 mLs (1,000 mg total) into the vein every Monday, Wednesday, and Friday with hemodialysis. 02/15/23   Leroy Sea, MD    Physical Exam: Vitals:   02/28/23 1300 02/28/23 1330 02/28/23 1350 02/28/23 1400  BP: (!) 136/95  (!) 101/45 117/83  Pulse:  (!) 119 (!) 119 (!) 119  Resp: (!) 39 18 (!) 28 16  Temp:      TempSrc:      SpO2:  100% 99% 93%  Weight:      Height:        Physical Exam Constitutional:      General: She is not in acute distress.    Appearance: She is obese. She is ill-appearing.  HENT:     Head: Normocephalic and atraumatic.     Mouth/Throat:     Mouth: Mucous membranes are moist.     Pharynx: Oropharynx is clear.  Eyes:     Extraocular Movements: Extraocular movements intact.     Pupils: Pupils are equal, round, and reactive to light.  Cardiovascular:     Rate and Rhythm: Regular rhythm. Tachycardia present.     Pulses: Normal pulses.     Heart sounds: Normal heart sounds.  Pulmonary:     Effort: Pulmonary effort is normal. No respiratory distress.     Breath sounds: Normal breath sounds.  Abdominal:     General: Bowel sounds  are normal. There is no distension.     Palpations: Abdomen is soft.     Tenderness: There is abdominal tenderness.  Musculoskeletal:        General: No swelling or deformity.     Comments: Status post right BKA.  Stump looks okay.  Skin:    General: Skin is warm and dry.     Comments: HD catheter in place.  Neurological:     General: No focal deficit present.     Mental Status: Mental status is at baseline.    Labs on Admission: I have personally reviewed following labs and imaging studies  CBC: Recent Labs  Lab 02/28/23 1129 02/28/23 1139  WBC 17.2*  --   NEUTROABS 12.9*  --   HGB 9.1* 9.9*  HCT 29.8* 29.0*  MCV 96.1  --   PLT 423*  --     Basic Metabolic Panel: Recent Labs  Lab 02/28/23 1129 02/28/23 1139  NA 141 144  K 3.0* 3.0*  CL 114* 114*  CO2 13*  --   GLUCOSE 132* 128*  BUN 27* 31*  CREATININE 5.36* 5.80*  CALCIUM 7.9*  --     GFR: Estimated Creatinine Clearance: 13.5 mL/min (A) (by C-G formula based on SCr of 5.8 mg/dL (H)).  Liver Function Tests: Recent Labs  Lab 02/28/23 1129  AST 26  ALT 14  ALKPHOS 73  BILITOT 0.6  PROT 7.0  ALBUMIN 2.5*    Urine analysis:    Component Value  Date/Time   COLORURINE STRAW (A) 06/19/2022 0638   APPEARANCEUR HAZY (A) 06/19/2022 0638   LABSPEC 1.010 06/19/2022 0638   PHURINE 5.0 06/19/2022 0638   GLUCOSEU NEGATIVE 06/19/2022 0638   HGBUR SMALL (A) 06/19/2022 0638   BILIRUBINUR NEGATIVE 06/19/2022 0638   KETONESUR NEGATIVE 06/19/2022 0638   PROTEINUR 30 (A) 06/19/2022 0638   UROBILINOGEN 0.2 10/02/2013 2108   NITRITE NEGATIVE 06/19/2022 0638   LEUKOCYTESUR LARGE (A) 06/19/2022 0638    Radiological Exams on Admission: CT ABDOMEN PELVIS WO CONTRAST Result Date: 02/28/2023 CLINICAL DATA:  Right-sided abdominal pain. EXAM: CT ABDOMEN AND PELVIS WITHOUT CONTRAST TECHNIQUE: Multidetector CT imaging of the abdomen and pelvis was performed following the standard protocol without IV contrast. RADIATION  DOSE REDUCTION: This exam was performed according to the departmental dose-optimization program which includes automated exposure control, adjustment of the mA and/or kV according to patient size and/or use of iterative reconstruction technique. COMPARISON:  02/17/2023 and earlier studies dating to 01/11/2020 FINDINGS: Lower chest: No acute findings. Hepatobiliary:  No mass visualized on this unenhanced exam. Pancreas: No mass or inflammatory process visualized on this unenhanced exam. Spleen:  Within normal limits in size. Adrenals/Urinary tract: No evidence of urolithiasis or hydronephrosis. Unremarkable unopacified urinary bladder. Stomach/Bowel: Image degradation by motion artifact noted in the lower abdomen and pelvis. No evidence of obstruction, inflammatory process, or abnormal fluid collections. Vascular/Lymphatic: No pathologically enlarged lymph nodes identified. No evidence of abdominal aortic aneurysm. Reproductive: Stable 4.7 cm mass contiguous with the left uterine fundus, consistent with a pedunculated fibroid. No evidence of inflammatory changes or ascites. Other:  None. Musculoskeletal:  No suspicious bone lesions identified. IMPRESSION: No acute findings. Stable pedunculated uterine fibroid. Electronically Signed   By: Danae Orleans M.D.   On: 02/28/2023 13:48   DG Chest Portable 1 View Result Date: 02/28/2023 CLINICAL DATA:  Chest pain. EXAM: PORTABLE CHEST 1 VIEW COMPARISON:  January 31, 2023. FINDINGS: Stable cardiomediastinal silhouette. Right internal jugular dialysis catheter is unchanged. Both lungs are clear. The visualized skeletal structures are unremarkable. IMPRESSION: No active disease. Electronically Signed   By: Lupita Raider M.D.   On: 02/28/2023 12:32   DG Knee Right Port Result Date: 02/28/2023 CLINICAL DATA:  Amputation assessment. EXAM: PORTABLE RIGHT KNEE - 1-2 VIEW COMPARISON:  May 12, 2022. FINDINGS: Status post right below-knee amputation. Surgical staples are  seen in the stomach. Stable degenerative changes seen involving the medial and lateral joint spaces. Stable osteochondroma arising medially from proximal tibia. IMPRESSION: Status post right below-knee amputation. Stable degenerative changes of right knee joint as well as osteo chondroma arising from proximal tibia. Electronically Signed   By: Lupita Raider M.D.   On: 02/28/2023 12:31    EKG: Independently reviewed.  Sinus tachycardia at 120 bpm.  Nonspecific T wave changes.  PVCs and PAC noted.  Assessment/Plan Principal Problem:   Intractable diarrhea Active Problems:   Chronic hypokalemia   Peripheral neuropathy   Leukocytosis   PAF (paroxysmal atrial fibrillation) (HCC)   Normocytic anemia   GERD (gastroesophageal reflux disease)   Morbid obesity (HCC)   Essential hypertension   Insulin dependent type 2 diabetes mellitus (HCC)   Diabetic gastroparesis (HCC)   Depression   Anxiety   Chronic pain   CHF (congestive heart failure) (HCC)   End stage renal disease on dialysis (HCC)   (HFpEF) heart failure with preserved ejection fraction (HCC)   Tardive dyskinesia   Right below-knee amputee (HCC)   Intractable diarrhea History of carcinoid  tumor > Patient presenting with severe intractable diarrhea. > Has had recurrent issues with this and is following up outpatient for it.  He is under consideration for somatostatin injections per chart review. > Multiple episodes while in the ED.  GI pathogen panel and C. difficile panel have been ordered.  Patient does have leukocytosis however this is chronic and near baseline.  No antibiotics ordered thus far. - Monitor in progressive unit overnight - IV fluids - Follow-up GI pathogen panel and C. difficile panel - Supportive care  Hypertension - Continue metoprolol  Diabetes - SSI  Neuropathy - Continue gabapentin  GERD - Continue PPI  Anemia > Stable at 9.1 in the ED. - Trend CBC  Paroxysmal atrial fibrillation - Continue  metoprolol  ESRD on HD > Missed dialysis on Saturday. - Nephrology notified of patient's admission and plan for dialysis tomorrow - Trend renal function and electrolytes  Chronic diastolic CHF > Last echo in July with EF 55-60%, G1 DD, normal RV function. - Volume managed with dialysis - Continue home metoprolol  Gout - Continue home allopurinol  History of duodenal carcinoid tumor - Has been following with oncology in the past and appears to still follow with gastroenterology. - As above is under consideration for possible injections presumably somatostatin  Status post right BKA History of some infection > History of osteomyelitis requiring amputation, subsequently had stump infection. > Had been on IV antibiotics now transition to p.o. antibiotics long-term until able to  follow-up with infectious disease. > Has chronic stable leukocytosis - Continue home doxycycline, Flagyl, cefadroxil - Trend fever curve and WBCs  NASH - Noted - LFT stable  Tardive dyskinesia - Noted - Supportive care  Depression Anxiety - Continue home BuSpar, mirtazapine, hydroxyzine  Obesity - Noted   Spinal stenosis Chronic pain > Ran out of pain medication and was not able to get refills per report. - Continue home p.o. Dilaudid 2 mg every 8 hours as needed. - Add 0.5 mg IV Dilaudid as needed overnight, told patient the plan would be to wean her back to her p.o.  Goals of care > Previously had some discussions about pursuing comfort care/hospice and stopping dialysis with her ongoing poor quality of life, and intermittent refusal of dialysis. > From states that she has thought of this some after this discussion and would like to speak with the palliative team for more information on what a transition to hospice care would look like. - Consult to palliative medicine  DVT prophylaxis: Heparin Code Status:   Full Family Communication:  None on admission Disposition Plan:   Patient is  from:  Home  Anticipated DC to:  Home  Anticipated DC date:  1 to 5 days  Anticipated DC barriers: None  Consults called:  Nephrology Admission status:  Observation, progressive  Severity of Illness: The appropriate patient status for this patient is OBSERVATION. Observation status is judged to be reasonable and necessary in order to provide the required intensity of service to ensure the patient's safety. The patient's presenting symptoms, physical exam findings, and initial radiographic and laboratory data in the context of their medical condition is felt to place them at decreased risk for further clinical deterioration. Furthermore, it is anticipated that the patient will be medically stable for discharge from the hospital within 2 midnights of admission.    Synetta Fail MD [facility]  How to contact the [facility] Attending or Consulting provider 7A - 7P or covering provider during after hours 7P -  7A, for this patient?   Check the care team in [facility] and look for a) attending/consulting [facility] provider listed and b) the [facility] team listed Log into [email or web address] and use [place]'s universal password to access. If you do not have the password, please contact the hospital operator. Locate the [facility] provider you are looking for under [facility] and page to a number that you can be directly reached. If you still have difficulty reaching the provider, please page the [facility] (Director on Call) for the Hospitalists listed on amion for assistance.  02/28/2023, 3:34 PM

## 2023-02-28 NOTE — Consult Note (Addendum)
Reason for Consult:ESRD Referring Physician: Dr. Beola Cord  Chief Complaint: Nominal pain  OP HD: TTS [facility]  4h  400/800  112kg   3K/2.5Ca bath   TDC   Heparin none - last OP HD 12/19 left at 11.6 kg  - has been usually reaching dry wt - venofer 100mg  q hd thru 12/04 - mircera 100 mcg q 2 wks, last 10/5, due 10/19  Assessment/Plan: ESKD - on HD TTS. Last HD Th 12/19. Labs and vol okay. Will plan HD here today (likely overnight) and then Th Sat per holiday schedule.  HTN - bp's wnl, takes metoprolol at home. Cont meds as needed.  Volume - no gross vol excess on exam. Plan UF 2 L w/ HD today.  Anemia of eskd - Hb 9.9 MBD ckd - CCa in range. Add on phos. Binders w/ meals.  Afib - not on anticoagulation because of GI bleeding. Chronic pain syndrome on narcotics PAD with rt BKA 10/23 with h/o stump dehiscence.    HPI: REDIA TURCO is an 57 y.o. female w/ sciatica, carcinoid tumor of the duodenum, HFrEF, DM, gastroparesis, HTN, NICM, PAF, CVA, ESRD dialyzing Tuesday Thursday Saturdays at [facility] with Dr. Signe Colt.  Patient missed dialysis on Saturday because she was not feeling well mainly because of the pain having run out of her opioids.  Patient has a appointment with pain clinic in the next few weeks.  Patient has some mild shortness of breath but the main complaint right now is pain all over especially in the abdomen and right leg.  Patient denies any fever, chills, nausea, vomiting.  ROS Pertinent items are noted in HPI.  Chemistry and CBC: Creatinine, Ser  Date/Time Value Ref Range Status  02/28/2023 11:39 AM 5.80 (H) 0.44 - 1.00 mg/dL Final  29/51/8841 66:06 AM 5.36 (H) 0.44 - 1.00 mg/dL Final  30/16/0109 32:35 AM 5.92 (H) 0.44 - 1.00 mg/dL Final  57/32/2025 42:70 PM 5.83 (H) 0.44 - 1.00 mg/dL Final  62/37/6283 15:17 AM 5.50 (H) 0.44 - 1.00 mg/dL Final  61/60/7371 06:26 AM 4.88 (H) 0.44 - 1.00 mg/dL Final  94/85/4627 03:50 PM 4.81 (H) 0.44 - 1.00 mg/dL Final   09/38/1829 93:71 AM 5.17 (H) 0.44 - 1.00 mg/dL Final  69/67/8938 10:17 AM 3.95 (H) 0.44 - 1.00 mg/dL Final  51/04/5850 77:82 AM 3.92 (H) 0.44 - 1.00 mg/dL Final  42/35/3614 43:15 AM 3.36 (H) 0.44 - 1.00 mg/dL Final  40/10/6759 95:09 AM 4.71 (H) 0.44 - 1.00 mg/dL Final  32/67/1245 80:99 AM 4.09 (H) 0.44 - 1.00 mg/dL Final  83/38/2505 39:76 AM 5.91 (H) 0.44 - 1.00 mg/dL Final  73/41/9379 02:40 PM 5.50 (H) 0.44 - 1.00 mg/dL Final  97/35/3299 24:26 PM 6.45 (H) 0.44 - 1.00 mg/dL Final  83/41/9622 29:79 PM 5.39 (H) 0.44 - 1.00 mg/dL Final  89/21/1941 74:08 PM 4.09 (H) 0.44 - 1.00 mg/dL Final  14/48/1856 31:49 AM 5.07 (H) 0.44 - 1.00 mg/dL Final  70/26/3785 88:50 AM 5.25 (H) 0.44 - 1.00 mg/dL Final  27/74/1287 86:76 AM 4.31 (H) 0.44 - 1.00 mg/dL Final    Comment:    DELTA NOTED. OK TO RELEASE RESULT PER ROSS,T RN.  01/06/2023 04:31 AM 2.94 (H) 0.44 - 1.00 mg/dL Final  72/11/4707 62:83 AM 4.26 (H) 0.44 - 1.00 mg/dL Final  66/29/4765 46:50 PM 4.72 (H) 0.44 - 1.00 mg/dL Final  35/46/5681 27:51 AM 4.49 (H) 0.44 - 1.00 mg/dL Final  70/03/7492 49:67 PM 4.19 (H) 0.44 - 1.00 mg/dL  Final  12/31/2022 04:25 AM 4.20 (H) 0.44 - 1.00 mg/dL Final  62/13/0865 78:46 AM 3.41 (H) 0.44 - 1.00 mg/dL Final  96/29/5284 13:24 PM 4.30 (H) 0.44 - 1.00 mg/dL Final  40/12/2723 36:64 PM 3.48 (H) 0.44 - 1.00 mg/dL Final  40/34/7425 95:63 AM 4.70 (H) 0.44 - 1.00 mg/dL Final  87/56/4332 95:18 AM 4.09 (H) 0.44 - 1.00 mg/dL Final  84/16/6063 01:60 AM 4.19 (H) 0.44 - 1.00 mg/dL Final  10/93/2355 73:22 AM 3.93 (H) 0.44 - 1.00 mg/dL Final  02/54/2706 23:76 AM 5.61 (H) 0.44 - 1.00 mg/dL Final  28/31/5176 16:07 AM 5.58 (H) 0.44 - 1.00 mg/dL Final  37/12/6267 48:54 AM 5.98 (H) 0.44 - 1.00 mg/dL Final  62/70/3500 93:81 AM 6.74 (H) 0.44 - 1.00 mg/dL Final  82/99/3716 96:78 PM 6.87 (H) 0.44 - 1.00 mg/dL Final  93/81/0175 10:25 AM 2.36 (H) 0.44 - 1.00 mg/dL Final  85/27/7824 23:53 PM 3.89 (H) 0.44 - 1.00 mg/dL Final  61/44/3154  00:86 AM 3.81 (H) 0.44 - 1.00 mg/dL Final  76/19/5093 26:71 PM 3.88 (H) 0.44 - 1.00 mg/dL Final  24/58/0998 33:82 AM 3.14 (H) 0.44 - 1.00 mg/dL Final  50/53/9767 34:19 AM 4.06 (H) 0.44 - 1.00 mg/dL Final  37/90/2409 73:53 AM 3.54 (H) 0.44 - 1.00 mg/dL Final  29/92/4268 34:19 AM 5.01 (H) 0.44 - 1.00 mg/dL Final  62/22/9798 92:11 AM 10.14 (H) 0.44 - 1.00 mg/dL Final  94/17/4081 44:81 PM 5.68 (H) 0.44 - 1.00 mg/dL Final  85/63/1497 02:63 PM 3.83 (H) 0.44 - 1.00 mg/dL Final  78/58/8502 77:41 AM 1.99 (H) 0.44 - 1.00 mg/dL Final    Comment:    DIALYSIS  09/10/2022 02:10 AM 5.32 (H) 0.44 - 1.00 mg/dL Final   Recent Labs  Lab 02/28/23 1129 02/28/23 1139  NA 141 144  K 3.0* 3.0*  CL 114* 114*  CO2 13*  --   GLUCOSE 132* 128*  BUN 27* 31*  CREATININE 5.36* 5.80*  CALCIUM 7.9*  --    Recent Labs  Lab 02/28/23 1129 02/28/23 1139  WBC 17.2*  --   NEUTROABS 12.9*  --   HGB 9.1* 9.9*  HCT 29.8* 29.0*  MCV 96.1  --   PLT 423*  --    Liver Function Tests: Recent Labs  Lab 02/28/23 1129  AST 26  ALT 14  ALKPHOS 73  BILITOT 0.6  PROT 7.0  ALBUMIN 2.5*   Recent Labs  Lab 02/28/23 1129  LIPASE 40   No results for input(s): "AMMONIA" in the last 168 hours. Cardiac Enzymes: No results for input(s): "CKTOTAL", "CKMB", "CKMBINDEX", "TROPONINI" in the last 168 hours. Iron Studies: No results for input(s): "IRON", "TIBC", "TRANSFERRIN", "FERRITIN" in the last 72 hours. PT/INR: @LABRCNTIP (inr:5)  Xrays/Other Studies: ) Results for orders placed or performed during the hospital encounter of 02/28/23 (from the past 48 hours)  CBG monitoring, ED     Status: Abnormal   Collection Time: 02/28/23 10:16 AM  Result Value Ref Range   Glucose-Capillary 113 (H) 70 - 99 mg/dL    Comment: Glucose reference range applies only to samples taken after fasting for at least 8 hours.   Comment 1 Notify RN    Comment 2 Document in Chart   C Difficile Quick Screen w PCR reflex     Status: None    Collection Time: 02/28/23 11:16 AM   Specimen: Stool  Result Value Ref Range   C Diff antigen NEGATIVE NEGATIVE   C Diff toxin NEGATIVE NEGATIVE  C Diff interpretation No C. difficile detected.     Comment: Performed at [facility], [address], [place], [place] [address]  CBC with Differential     Status: Abnormal   Collection Time: 02/28/23 11:29 AM  Result Value Ref Range   WBC 17.2 (H) 4.0 - 10.5 K/uL   RBC 3.10 (L) 3.87 - 5.11 MIL/uL   Hemoglobin 9.1 (L) 12.0 - 15.0 g/dL   HCT 29.5 (L) 62.1 - 30.8 %   MCV 96.1 80.0 - 100.0 fL   MCH 29.4 26.0 - 34.0 pg   MCHC 30.5 30.0 - 36.0 g/dL   RDW 65.7 (H) 84.6 - 96.2 %   Platelets 423 (H) 150 - 400 K/uL   nRBC 0.0 0.0 - 0.2 %   Neutrophils Relative % 75 %   Neutro Abs 12.9 (H) 1.7 - 7.7 K/uL   Lymphocytes Relative 16 %   Lymphs Abs 2.8 0.7 - 4.0 K/uL   Monocytes Relative 5 %   Monocytes Absolute 0.8 0.1 - 1.0 K/uL   Eosinophils Relative 2 %   Eosinophils Absolute 0.4 0.0 - 0.5 K/uL   Basophils Relative 1 %   Basophils Absolute 0.1 0.0 - 0.1 K/uL   Immature Granulocytes 1 %   Abs Immature Granulocytes 0.14 (H) 0.00 - 0.07 K/uL    Comment: Performed at [facility], [address] [place], [place] [address]  Comprehensive metabolic panel     Status: Abnormal   Collection Time: 02/28/23 11:29 AM  Result Value Ref Range   Sodium 141 135 - 145 mmol/L   Potassium 3.0 (L) 3.5 - 5.1 mmol/L   Chloride 114 (H) 98 - 111 mmol/L   CO2 13 (L) 22 - 32 mmol/L   Glucose, Bld 132 (H) 70 - 99 mg/dL    Comment: Glucose reference range applies only to samples taken after fasting for at least 8 hours.   BUN 27 (H) 6 - 20 mg/dL   Creatinine, Ser 1.32 (H) 0.44 - 1.00 mg/dL   Calcium 7.9 (L) 8.9 - 10.3 mg/dL   Total Protein 7.0 6.5 - 8.1 g/dL   Albumin 2.5 (L) 3.5 - 5.0 g/dL   AST 26 15 - 41 U/L   ALT 14 0 - 44 U/L   Alkaline Phosphatase 73 38 - 126 U/L   Total Bilirubin 0.6 <1.2 mg/dL   GFR, Estimated 9 (L) >60 mL/min     Comment: (NOTE) Calculated using the CKD-EPI Creatinine Equation (2021)    Anion gap 14 5 - 15    Comment: Performed at [facility], [address], [place], [place] [address]  Lipase, blood     Status: None   Collection Time: 02/28/23 11:29 AM  Result Value Ref Range   Lipase 40 11 - 51 U/L    Comment: Performed at [facility], [address], [place], [place] [address]  Troponin I (High Sensitivity)     Status: None   Collection Time: 02/28/23 11:29 AM  Result Value Ref Range   Troponin I (High Sensitivity) 15 <18 ng/L    Comment: (NOTE) Elevated high sensitivity troponin I (hsTnI) values and significant  changes across serial measurements may suggest ACS but many other  chronic and acute conditions are known to elevate hsTnI results.  Refer to the "Links" section for chest pain algorithms and additional  guidance. Performed at [facility], [address] [place], [place] [address]   Lactic acid,  plasma     Status: Abnormal   Collection Time: 02/28/23 11:29 AM  Result Value Ref Range   Lactic Acid, Venous 2.0 (HH) 0.5 - 1.9 mmol/L    Comment: CRITICAL RESULT CALLED TO, READ BACK BY AND VERIFIED WITH T. Shropshire RN , @1216 , 02/28/23, Dabdee, T. Performed at [facility], [address], [place], [place] [address]   I-stat chem 8, ED (not at [facility], [facility] or [facility])     Status: Abnormal   Collection Time: 02/28/23 11:39 AM  Result Value Ref Range   Sodium 144 135 - 145 mmol/L   Potassium 3.0 (L) 3.5 - 5.1 mmol/L   Chloride 114 (H) 98 - 111 mmol/L   BUN 31 (H) 6 - 20 mg/dL   Creatinine, Ser 6.04 (H) 0.44 - 1.00 mg/dL   Glucose, Bld 540 (H) 70 - 99 mg/dL    Comment: Glucose reference range applies only to samples taken after fasting for at least 8 hours.   Calcium, Ion 0.96 (L) 1.15 - 1.40 mmol/L   TCO2 15 (L) 22 - 32 mmol/L   Hemoglobin 9.9 (L) 12.0 - 15.0 g/dL   HCT 98.1 (L) 19.1 - 47.8 %   CT ABDOMEN PELVIS WO CONTRAST Result Date:  02/28/2023 CLINICAL DATA:  Right-sided abdominal pain. EXAM: CT ABDOMEN AND PELVIS WITHOUT CONTRAST TECHNIQUE: Multidetector CT imaging of the abdomen and pelvis was performed following the standard protocol without IV contrast. RADIATION DOSE REDUCTION: This exam was performed according to the departmental dose-optimization program which includes automated exposure control, adjustment of the mA and/or kV according to patient size and/or use of iterative reconstruction technique. COMPARISON:  02/17/2023 and earlier studies dating to 01/11/2020 FINDINGS: Lower chest: No acute findings. Hepatobiliary:  No mass visualized on this unenhanced exam. Pancreas: No mass or inflammatory process visualized on this unenhanced exam. Spleen:  Within normal limits in size. Adrenals/Urinary tract: No evidence of urolithiasis or hydronephrosis. Unremarkable unopacified urinary bladder. Stomach/Bowel: Image degradation by motion artifact noted in the lower abdomen and pelvis. No evidence of obstruction, inflammatory process, or abnormal fluid collections. Vascular/Lymphatic: No pathologically enlarged lymph nodes identified. No evidence of abdominal aortic aneurysm. Reproductive: Stable 4.7 cm mass contiguous with the left uterine fundus, consistent with a pedunculated fibroid. No evidence of inflammatory changes or ascites. Other:  None. Musculoskeletal:  No suspicious bone lesions identified. IMPRESSION: No acute findings. Stable pedunculated uterine fibroid. Electronically Signed   By: Danae Orleans M.D.   On: 02/28/2023 13:48   DG Chest Portable 1 View Result Date: 02/28/2023 CLINICAL DATA:  Chest pain. EXAM: PORTABLE CHEST 1 VIEW COMPARISON:  January 31, 2023. FINDINGS: Stable cardiomediastinal silhouette. Right internal jugular dialysis catheter is unchanged. Both lungs are clear. The visualized skeletal structures are unremarkable. IMPRESSION: No active disease. Electronically Signed   By: Lupita Raider M.D.   On:  02/28/2023 12:32   DG Knee Right Port Result Date: 02/28/2023 CLINICAL DATA:  Amputation assessment. EXAM: PORTABLE RIGHT KNEE - 1-2 VIEW COMPARISON:  May 12, 2022. FINDINGS: Status post right below-knee amputation. Surgical staples are seen in the stomach. Stable degenerative changes seen involving the medial and lateral joint spaces. Stable osteochondroma arising medially from proximal tibia. IMPRESSION: Status post right below-knee amputation. Stable degenerative changes of right knee joint as well as osteo chondroma arising from proximal tibia. Electronically Signed   By: Lupita Raider M.D.   On: 02/28/2023 12:31    PMH:   Past Medical History:  Diagnosis Date  Acute back pain with sciatica, left    Acute back pain with sciatica, right    Acute encephalopathy 05/29/2022   Acute osteomyelitis of right calcaneus (HCC) 12/21/2022   AKI (acute kidney injury) (HCC)    Anemia, unspecified    Atrial fibrillation with RVR (HCC)-resolved 09/09/2022   Atypical chest pain 09/10/2021   Cancer (HCC)    Carcinoid tumor of duodenum    Chest pain with normal coronary angiography 2019   Chronic a-fib (HCC) 09/09/2022   Chronic kidney disease, stage 3b (HCC)    Chronic pain    Chronic systolic CHF (congestive heart failure) (HCC)    Dehiscence of amputation stump of right lower extremity (HCC) 01/29/2023   Diabetes mellitus    DKA (diabetic ketoacidosis) (HCC)    Drug-seeking behavior    21 hospitalizations and 14 CT a/p in 2 years for N/V and abdominal pain, demanding only IV dilaudid   Elevated troponin    chronic   Esophageal reflux    Fibromyalgia    Gastric ulcer    Gastroparesis    Gout    HCAP (healthcare-associated pneumonia) 06/19/2022   Hyperlipidemia    Hyperosmolar hyperglycemic state (HHS) (HCC) 05/11/2022   Hyperosmolar non-ketotic state due to type 2 diabetes mellitus (HCC) 05/11/2022   Hypertension    Hypokalemia    Hypomagnesemia    Lumbosacral stenosis    LVH  (left ventricular hypertrophy)    Morbid obesity (HCC)    Nausea & vomiting 09/09/2022   NICM (nonischemic cardiomyopathy) (HCC)    PAF (paroxysmal atrial fibrillation) (HCC)    Sepsis (HCC) 11/23/2022   Stroke (HCC) 02/2011   Symptomatic anemia 12/14/2022   Thrombocytosis    Vitamin B12 deficiency anemia     PSH:   Past Surgical History:  Procedure Laterality Date   ABDOMINAL AORTOGRAM W/LOWER EXTREMITY N/A 11/29/2022   Procedure: ABDOMINAL AORTOGRAM W/LOWER EXTREMITY;  Surgeon: Daria Pastures, MD;  Location: [facility];  Service: Cardiovascular;  Laterality: N/A;   AMPUTATION Right 12/29/2022   Procedure: RIGHT BELOW KNEE AMPUTATION;  Surgeon: Nadara Mustard, MD;  Location: [facility] OR;  Service: Orthopedics;  Laterality: Right;   AV FISTULA PLACEMENT Left 06/30/2022   Procedure: LEFT BRACHIOCEPHALIC ARTERIOVENOUS (AV) FISTULA CREATION;  Surgeon: Cephus Shelling, MD;  Location: [facility] OR;  Service: Vascular;  Laterality: Left;   BIOPSY  07/27/2019   Procedure: BIOPSY;  Surgeon: Vida Rigger, MD;  Location: [facility]OSCOPY;  Service: Endoscopy;;   BIOPSY  07/30/2019   Procedure: BIOPSY;  Surgeon: Kathi Der, MD;  Location: [facility];  Service: Gastroenterology;;   CATARACT EXTRACTION  01/2014   CHOLECYSTECTOMY     COLONOSCOPY WITH PROPOFOL N/A 07/30/2019   Procedure: COLONOSCOPY WITH PROPOFOL;  Surgeon: Kathi Der, MD;  Location: [facility];  Service: Gastroenterology;  Laterality: N/A;   ESOPHAGOGASTRODUODENOSCOPY N/A 07/27/2019   Procedure: ESOPHAGOGASTRODUODENOSCOPY (EGD);  Surgeon: Vida Rigger, MD;  Location: [facility];  Service: Endoscopy;  Laterality: N/A;   ESOPHAGOGASTRODUODENOSCOPY N/A 07/26/2020   Procedure: ESOPHAGOGASTRODUODENOSCOPY (EGD);  Surgeon: Willis Modena, MD;  Location: [facility];  Service: Endoscopy;  Laterality: N/A;   ESOPHAGOGASTRODUODENOSCOPY (EGD) WITH PROPOFOL N/A 08/02/2019   Procedure: ESOPHAGOGASTRODUODENOSCOPY (EGD) WITH PROPOFOL;   Surgeon: Kathi Der, MD;  Location: [facility];  Service: Gastroenterology;  Laterality: N/A;   ESOPHAGOGASTRODUODENOSCOPY (EGD) WITH PROPOFOL N/A 12/23/2022   Procedure: ESOPHAGOGASTRODUODENOSCOPY (EGD) WITH PROPOFOL;  Surgeon: Shellia Cleverly, DO;  Location: [facility];  Service: Gastroenterology;  Laterality: N/A;   FISTULA SUPERFICIALIZATION Left  12/31/2022   Procedure: LEFT ARM FISTULA TRANSPOSITION;  Surgeon: Maeola Harman, MD;  Location: [facility] OR;  Service: Vascular;  Laterality: Left;   GIVENS CAPSULE STUDY N/A 12/23/2022   Procedure: GIVENS CAPSULE STUDY;  Surgeon: Shellia Cleverly, DO;  Location: [facility];  Service: Gastroenterology;  Laterality: N/A;   HEMOSTASIS CLIP PLACEMENT  08/02/2019   Procedure: HEMOSTASIS CLIP PLACEMENT;  Surgeon: Kathi Der, MD;  Location: [facility];  Service: Gastroenterology;;   HOT HEMOSTASIS N/A 12/23/2022   Procedure: HOT HEMOSTASIS (ARGON PLASMA COAGULATION/BICAP);  Surgeon: Shellia Cleverly, DO;  Location: [facility] ENDOSCOPY;  Service: Gastroenterology;  Laterality: N/A;   IR FLUORO GUIDE CV LINE RIGHT  06/24/2022   IR US GUIDE VASC ACCESS RIGHT  06/24/2022   POLYPECTOMY  07/30/2019   Procedure: POLYPECTOMY;  Surgeon: Kathi Der, MD;  Location: [facility];  Service: Gastroenterology;;   POLYPECTOMY  08/02/2019   Procedure: POLYPECTOMY;  Surgeon: Kathi Der, MD;  Location: [facility];  Service: Gastroenterology;;   STUMP REVISION Right 02/02/2023   Procedure: REVISION RIGHT BELOW KNEE AMPUTATION;  Surgeon: Nadara Mustard, MD;  Location: [facility] OR;  Service: Orthopedics;  Laterality: Right;    Allergies:  Allergies  Allergen Reactions   Isovue [Iopamidol] Anaphylaxis, [name] and Other (See Comments)    11/28/17 Patient had seizure like activity and then 1 min code after 100 cc of isovue 300. Possible contrast allergy vs vasovagal episode  Cardiac Arrest   Neurontin [Gabapentin] Shortness Of  Breath and Swelling   Nsaids Anaphylaxis and Other (See Comments)    Hx of stomach ulcers   Penicillins Itching, Palpitations and Other (See Comments)    Flushing (Red Skin) Laryngeal Edema   Reglan [Metoclopramide] Other (See Comments)    Tardive dyskinesia    Valium [Diazepam] [name]   Zestril [Lisinopril] Anaphylaxis and Swelling    Tongue and mouth swelling Laryngeal Edema   Tolectin [Tolmetin] Nausea And Vomiting, Nausea Only and Other (See Comments)    Irritates stomach ulcer   Asa [Aspirin] Other (See Comments)    Hx of stomach ulcer   Aspartame And Phenylalanine Hives   Bentyl [Dicyclomine] Other (See Comments)    Chest pain   Hibiclens [Chlorhexidine Gluconate] Other (See Comments)    Dermatitis    Flexeril [Cyclobenzaprine] Palpitations   Oxycontin [Oxycodone] Palpitations   Rifamycins Palpitations   Tylenol [Acetaminophen] Nausea And Vomiting, Nausea Only and Other (See Comments)    Irritates stomach ulcer Abdominal pain   Ultram [Tramadol] Nausea And Vomiting and Palpitations    Medications:   Prior to Admission medications   Medication Sig Start Date End Date Taking? Authorizing Provider  albuterol (PROVENTIL) (2.5 MG/3ML) 0.083% nebulizer solution Take 3 mLs (2.5 mg total) by nebulization every 6 (six) hours as needed for wheezing or shortness of breath. 04/06/19  Yes Bing Neighbors, NP  allopurinol (ZYLOPRIM) 100 MG tablet Take 1 tablet (100 mg total) by mouth 2 (two) times daily. 11/30/22  Yes Azucena Fallen, MD  ascorbic acid (VITAMIN C) 500 MG tablet Take 500 mg by mouth daily.   Yes [provider]  atorvastatin (LIPITOR) 10 MG tablet Take 10 mg by mouth every evening. 12/03/21  Yes [provider]  busPIRone (BUSPAR) 5 MG tablet Take 5 mg by mouth 3 (three) times daily.   Yes [provider]  cefadroxil (DURICEF) 500 MG capsule Take 1 capsule (500 mg total) by mouth daily for 14 days. 02/23/23  Yes Thedore Mins,  Stanford Scotland, MD  cetirizine (ZYRTEC) 10 MG tablet Take 10 mg by mouth daily. 08/05/22  Yes [provider]  famotidine (PEPCID) 20 MG tablet Take 20 mg by mouth daily before breakfast.   Yes [provider]  ferric citrate (AURYXIA) 1 GM 210 MG(Fe) tablet Take 2 tablets (420 mg total) by mouth 3 (three) times daily with meals. Patient taking differently: Take 210 mg by mouth 3 (three) times daily with meals. 11/30/22  Yes Azucena Fallen, MD  fluticasone [facility]) 50 MCG/ACT nasal spray Place 2 sprays into both nostrils daily as needed for allergies or rhinitis. 12/19/18  Yes Rai, Ripudeep K, MD  folic acid (FOLVITE) 1 MG tablet Take 1 tablet (1 mg total) by mouth daily. 01/10/23  Yes Danford, Earl Lites, MD  gabapentin (NEURONTIN) 100 MG capsule Take 1 capsule (100 mg total) by mouth 3 (three) times daily. 11/30/22  Yes Azucena Fallen, MD  hydrOXYzine (ATARAX) 25 MG tablet Take 1 tablet (25 mg total) by mouth 3 (three) times daily as needed for anxiety. Patient taking differently: Take 25 mg by mouth See admin instructions. Give 25 mg (1 tablet) every 6 hours as needed for anxiety, agitation for 14 days (starting 11/19), then every 8 hours as needed. 01/10/23  Yes Danford, Earl Lites, MD  insulin glargine (SEMGLEE, YFGN,) 100 UNIT/ML Solostar Pen Inject 8 Units into the skin at bedtime.   Yes [provider]  insulin lispro (HUMALOG) 100 UNIT/ML KwikPen Before each meal 3 times a day, 140-199 - 2 units, 200-250 - 6 units, 251-299 - 8 units,  300-349 - 12 units,  350 or above 14 units. Patient taking differently: Inject 0-14 Units into the skin See admin instructions. Inject 0-14 units per sliding scale before meals: < 70 notify MD 70-139 : 0 units 140-199 : 2 units 200-250 : 6 units 251-299 : 8 units 300-349 : 12 units 350-400 : 14 units > 401 notify MD. 06/04/22  Yes Leroy Sea, MD  loperamide (IMODIUM) 2 MG capsule Take 1 capsule (2 mg total) by  mouth as needed for diarrhea or loose stools. Patient taking differently: Take 2 mg by mouth daily as needed for diarrhea or loose stools. 01/10/23  Yes Danford, Earl Lites, MD  methocarbamol (ROBAXIN) 500 MG tablet Take 1 tablet (500 mg total) by mouth every 6 (six) hours as needed for muscle spasms. 01/10/23  Yes Danford, Earl Lites, MD  metoprolol tartrate (LOPRESSOR) 50 MG tablet Take 1 tablet (50 mg total) by mouth 2 (two) times daily. 01/10/23  Yes Danford, Earl Lites, MD  metroNIDAZOLE (FLAGYL) 500 MG tablet Take 1 tablet (500 mg total) by mouth every 12 (twelve) hours. 02/15/23  Yes Leroy Sea, MD  mirtazapine (REMERON) 15 MG tablet Take 15 mg by mouth at bedtime. 10/18/22  Yes [provider]  Multiple Vitamins-Minerals (SENIOR MULTIVITAMIN PLUS PO) Take 1 tablet by mouth daily.   Yes [provider]  nutrition supplement, JUVEN, (JUVEN) PACK Take 1 packet by mouth 2 (two) times daily between meals. 01/10/23  Yes Danford, Earl Lites, MD  Nutritional Supplements (,FEEDING SUPPLEMENT, PROSOURCE PLUS) liquid Take 30 mLs by mouth 2 (two) times daily between meals. 01/10/23  Yes Danford, Earl Lites, MD  pantoprazole (PROTONIX) 40 MG tablet Take 1 tablet (40 mg total) by mouth 2 (two) times daily. 02/18/23  Yes Raulkar, Drema Pry, MD  vancomycin (VANCOCIN) 1-5 GM/200ML-% SOLN Inject 200 mLs (1,000 mg total) into the vein every Monday, Wednesday,  and Friday with hemodialysis. Patient taking differently: Inject 1,000 mg into the vein See admin instructions. Inject intravenous every Tuesday Thursday and Saturdays per patient 02/15/23  Yes Susa Raring K, MD  cefTAZidime 0.5 g in dextrose 5 % 50 mL Inject 0.5 g into the vein daily. Patient not taking: Reported on 02/28/2023 02/15/23   Leroy Sea, MD  colchicine 0.6 MG tablet Take 0.5 tablets (0.3 mg total) by mouth 2 (two) times a week. Patient not taking: Reported on 01/29/2023 12/02/22   Azucena Fallen, MD  doxycycline (VIBRA-TABS) 100 MG tablet Take 1 tablet (100 mg total) by mouth 2 (two) times daily. 02/23/23   Leroy Sea, MD  HYDROmorphone (DILAUDID) 4 MG tablet Take 1 tablet (4 mg total) by mouth See admin instructions. Give 4 mg (1 tablet) three times daily for pain. Give first dose at 0600 on dialysis days (T-Th-Sat), otherwise give first dose at 0900. May give an additional tablet every 24 hours as needed for severe pain (do not administer within 3 hours of scheduled dose). Patient not taking: Reported on 02/28/2023 02/24/23   Nadara Mustard, MD    Discontinued Meds:   Medications Discontinued During This Encounter  Medication Reason   gabapentin (NEURONTIN) capsule 100 mg    Amino Acids-Protein Hydrolys (FEEDING SUPPLEMENT, PRO-STAT SUGAR FREE 64,) LIQD Patient Preference   Nutritional Supplement LIQD Patient Preference    Social History:  reports that she has never smoked. She has never used smokeless tobacco. She reports that she does not drink alcohol and does not use drugs.  Family History:   Family History  Problem Relation Age of Onset   Diabetes Mother    Diabetes Father    Heart disease Father    Diabetes Sister    Congestive Heart Failure Sister 64   Diabetes Brother     Blood pressure 95/83, pulse (!) 111, temperature (!) 97.3 F (36.3 C), temperature source Oral, resp. rate 14, height 5\' 6"  (1.676 m), weight 113.4 kg, last menstrual period 10/10/2012, SpO2 100%. Gen alert, no distress No rash, cyanosis or gangrene Sclera anicteric, throat clear  No jvd or bruits Chest clear bilat to bases, no rales/ wheezing RRR no MRG Abd soft ntnd no mass or ascites +bs GU defer MS [name] wounds w/ large openings and drainage Ext no L lower ext edema Neuro is alert, Ox 3 , nf  \RIJ TDC in place,. Lt BCF elevated wounds closed good bruit and augments       Jamorion Gomillion, Len Blalock, MD 02/28/2023, 4:06 PM

## 2023-02-28 NOTE — ED Notes (Signed)
Patient transported to CT 

## 2023-02-28 NOTE — Progress Notes (Signed)
Patient refused for dialysis tonight.  Dialysis RN aware. MD notified

## 2023-03-01 DIAGNOSIS — R197 Diarrhea, unspecified: Secondary | ICD-10-CM | POA: Diagnosis present

## 2023-03-01 DIAGNOSIS — I482 Chronic atrial fibrillation, unspecified: Secondary | ICD-10-CM | POA: Diagnosis present

## 2023-03-01 DIAGNOSIS — G6289 Other specified polyneuropathies: Secondary | ICD-10-CM | POA: Diagnosis not present

## 2023-03-01 DIAGNOSIS — T8789 Other complications of amputation stump: Secondary | ICD-10-CM | POA: Diagnosis not present

## 2023-03-01 DIAGNOSIS — E11621 Type 2 diabetes mellitus with foot ulcer: Secondary | ICD-10-CM | POA: Diagnosis present

## 2023-03-01 DIAGNOSIS — E861 Hypovolemia: Secondary | ICD-10-CM | POA: Diagnosis not present

## 2023-03-01 DIAGNOSIS — I480 Paroxysmal atrial fibrillation: Secondary | ICD-10-CM | POA: Diagnosis present

## 2023-03-01 DIAGNOSIS — L97529 Non-pressure chronic ulcer of other part of left foot with unspecified severity: Secondary | ICD-10-CM | POA: Diagnosis present

## 2023-03-01 DIAGNOSIS — Z992 Dependence on renal dialysis: Secondary | ICD-10-CM | POA: Diagnosis not present

## 2023-03-01 DIAGNOSIS — N186 End stage renal disease: Secondary | ICD-10-CM | POA: Diagnosis present

## 2023-03-01 DIAGNOSIS — D72829 Elevated white blood cell count, unspecified: Secondary | ICD-10-CM | POA: Diagnosis present

## 2023-03-01 DIAGNOSIS — I132 Hypertensive heart and chronic kidney disease with heart failure and with stage 5 chronic kidney disease, or end stage renal disease: Secondary | ICD-10-CM | POA: Diagnosis present

## 2023-03-01 DIAGNOSIS — Z6841 Body Mass Index (BMI) 40.0 and over, adult: Secondary | ICD-10-CM | POA: Diagnosis not present

## 2023-03-01 DIAGNOSIS — K7581 Nonalcoholic steatohepatitis (NASH): Secondary | ICD-10-CM | POA: Diagnosis present

## 2023-03-01 DIAGNOSIS — I428 Other cardiomyopathies: Secondary | ICD-10-CM | POA: Diagnosis present

## 2023-03-01 DIAGNOSIS — E785 Hyperlipidemia, unspecified: Secondary | ICD-10-CM | POA: Diagnosis present

## 2023-03-01 DIAGNOSIS — Z515 Encounter for palliative care: Secondary | ICD-10-CM | POA: Diagnosis not present

## 2023-03-01 DIAGNOSIS — Z794 Long term (current) use of insulin: Secondary | ICD-10-CM | POA: Diagnosis not present

## 2023-03-01 DIAGNOSIS — D631 Anemia in chronic kidney disease: Secondary | ICD-10-CM | POA: Diagnosis present

## 2023-03-01 DIAGNOSIS — Z7189 Other specified counseling: Secondary | ICD-10-CM | POA: Diagnosis not present

## 2023-03-01 DIAGNOSIS — Z89511 Acquired absence of right leg below knee: Secondary | ICD-10-CM | POA: Diagnosis not present

## 2023-03-01 DIAGNOSIS — I5032 Chronic diastolic (congestive) heart failure: Secondary | ICD-10-CM | POA: Diagnosis present

## 2023-03-01 DIAGNOSIS — G546 Phantom limb syndrome with pain: Secondary | ICD-10-CM | POA: Diagnosis not present

## 2023-03-01 DIAGNOSIS — D649 Anemia, unspecified: Secondary | ICD-10-CM

## 2023-03-01 DIAGNOSIS — F32A Depression, unspecified: Secondary | ICD-10-CM | POA: Diagnosis present

## 2023-03-01 DIAGNOSIS — I10 Essential (primary) hypertension: Secondary | ICD-10-CM | POA: Diagnosis not present

## 2023-03-01 DIAGNOSIS — E1122 Type 2 diabetes mellitus with diabetic chronic kidney disease: Secondary | ICD-10-CM | POA: Diagnosis present

## 2023-03-01 DIAGNOSIS — E872 Acidosis, unspecified: Secondary | ICD-10-CM | POA: Diagnosis present

## 2023-03-01 LAB — GASTROINTESTINAL PANEL BY PCR, STOOL (REPLACES STOOL CULTURE)

## 2023-03-01 LAB — COMPREHENSIVE METABOLIC PANEL
ALT: 15 U/L (ref 0–44)
AST: 21 U/L (ref 15–41)
Albumin: 2.3 g/dL — ABNORMAL LOW (ref 3.5–5.0)
Alkaline Phosphatase: 73 U/L (ref 38–126)
Anion gap: 16 — ABNORMAL HIGH (ref 5–15)
BUN: 28 mg/dL — ABNORMAL HIGH (ref 6–20)
CO2: 12 mmol/L — ABNORMAL LOW (ref 22–32)
Calcium: 7.9 mg/dL — ABNORMAL LOW (ref 8.9–10.3)
Chloride: 113 mmol/L — ABNORMAL HIGH (ref 98–111)
Creatinine, Ser: 5.3 mg/dL — ABNORMAL HIGH (ref 0.44–1.00)
GFR, Estimated: 9 mL/min — ABNORMAL LOW (ref >60)
Glucose, Bld: 145 mg/dL — ABNORMAL HIGH (ref 70–99)
Potassium: 2.9 mmol/L — ABNORMAL LOW (ref 3.5–5.1)
Sodium: 141 mmol/L (ref 135–145)
Total Bilirubin: 0.6 mg/dL (ref <1.2)
Total Protein: 6.4 g/dL — ABNORMAL LOW (ref 6.5–8.1)

## 2023-03-01 LAB — GLUCOSE, CAPILLARY
Glucose-Capillary: 119 mg/dL — ABNORMAL HIGH (ref 70–99)
Glucose-Capillary: 100 mg/dL — ABNORMAL HIGH (ref 70–99)
Glucose-Capillary: 121 mg/dL — ABNORMAL HIGH (ref 70–99)

## 2023-03-01 LAB — CBC
HCT: 31.8 % — ABNORMAL LOW (ref 36.0–46.0)
Hemoglobin: 9.7 g/dL — ABNORMAL LOW (ref 12.0–15.0)
MCH: 29 pg (ref 26.0–34.0)
MCHC: 30.5 g/dL (ref 30.0–36.0)
MCV: 95.2 fL (ref 80.0–100.0)
Platelets: 346 10*3/uL (ref 150–400)
RBC: 3.34 MIL/uL — ABNORMAL LOW (ref 3.87–5.11)
RDW: 15.6 % — ABNORMAL HIGH (ref 11.5–15.5)
WBC: 14.9 10*3/uL — ABNORMAL HIGH (ref 4.0–10.5)
nRBC: 0 % (ref 0.0–0.2)

## 2023-03-01 MED ORDER — HEPARIN SODIUM (PORCINE) 1000 UNIT/ML IJ SOLN
INTRAMUSCULAR | Status: AC
  Administered 2023-03-01: 1000 [IU]
  Filled 2023-03-01: qty 4

## 2023-03-01 NOTE — Progress Notes (Signed)
   03/01/23 1512  Vitals  Temp 98 F (36.7 C)  Pulse Rate (!) 103  Resp (!) 24  BP (!) 121/101  SpO2 100 %  O2 Device Room Air  During Treatment Monitoring  Blood Flow Rate (mL/min) 199 mL/min  Arterial Pressure (mmHg) -84.64 mmHg  Venous Pressure (mmHg) 75.55 mmHg  TMP (mmHg) -115.35 mmHg  Ultrafiltration Rate (mL/min) 1235 mL/min  Dialysate Flow Rate (mL/min) 299 ml/min  Dialysate Potassium Concentration 4  Dialysate Calcium Concentration 2.5  Duration of HD Treatment -hour(s) 2.82 hour(s)  Cumulative Fluid Removed (mL) per Treatment  1772.15  HD Safety Checks Performed Yes  Intra-Hemodialysis Comments Tx completed (tx ended  early d/t circuit issues , asking to change set up)   Received patient in bed to unit.  Alert and oriented.  Informed consent signed and in chart.   TX duration: 2:49  Patient tolerated well.  Transported back to the room  Alert, without acute distress.  Hand-off given to patient's nurse.   Access used: Yes Access issues: Yes in the last 20 mins, machine started to beep and the pt. Request to come off  Total UF removed: 1800 Medication(s) given: See MAR  Post HD VS: See Above Grid Post HD weight: No weight obtained   [facility]

## 2023-03-01 NOTE — Progress Notes (Signed)
[facility] PROGRESS NOTE   DARL FULLERTON ZOX:[id number] DOB: 03/09/1965 DOA: 02/28/2023  PCP: Pcp, No  Brief History: 57 y.o. female with medical history significant for hypertension, diabetes, neuropathy, gastroparesis, GERD, anemia, paroxysmal atrial fibrillation, ESRD on HD, CHF, gout, duodenal carcinoid tumor, status post BKA on the right, history of gastric AVM, NASH, tardive dyskinesia, depression, anxiety, obesity, restrictive lung disease, spinal stenosis, chronic pain presenting with abdominal pain and diarrhea.  She has had history of same in the past and has undergone outpatient workup.  Followed by gastroenterology.  Apparently she was under consideration for somatostatin injections considering her history of carcinoid tumor.  She was hospitalized for further management.  CT scan did not show any acute findings.  Consultants: None  Procedures: None    Subjective/Interval History: Patient continues to have diarrhea.  Last episode was about 3 hours ago.  Some nausea but no vomiting.  Last vomiting was last night.  Continues to have abdominal pain.  She points to her lower abdomen towards the right.    Assessment/Plan:  Intractable diarrhea/history of carcinoid tumor/abdominal pain C. difficile was negative.  GI pathogen panel is pending. Patient has had a longstanding history of diarrhea and was under consideration for somatostatin.  Unclear which GI practice she was following with.  She does not seem to know. Will see how her diarrhea is over the next 24 hours before deciding to involve gastroenterology. Her last colonoscopy was in 2021 which showed polyps and internal hemorrhoids. Underwent capsule endoscopy in October which did not show any active bleeding. Underwent upper endoscopy in October which showed esophagitis, gastritis and gastric angiectasia's. CT abdomen pelvis did not show any acute findings.  End-stage renal disease on hemodialysis She is  dialyzed on Tuesday Thursday Saturday schedule.  Nephrology is following.  Essential hypertension Home medications.  Paroxysmal atrial fibrillation Continue metoprolol.  No longer on Eliquis due to GI bleeding in 2024 October  Normocytic anemia Likely multifactorial.  No evidence of overt bleeding.  Chronic diastolic CHF Volume being managed with hemodialysis.  Diabetes mellitus in the setting of end-stage renal disease Monitor CBGs.  Status post right BKA/wound infection Was on IV antibiotics recently.  Now on oral antibiotics.  Noted to be on cefadroxil and doxycycline and Flagyl.  History of depression and anxiety Continue home medications.  History of chronic pain syndrome/spinal stenosis Continue home medications.  Goals of care Palliative care to see.  Obesity Estimated body mass index is 39.82 kg/m as calculated from the following:   Height as of this encounter: 5\' 6"  (1.676 m).   Weight as of this encounter: 111.9 kg.   DVT Prophylaxis: Subcutaneous heparin Code Status: Full code Family Communication: Discussed with patient Disposition Plan: To be determined  Status is: Observation The patient will require care spanning > 2 midnights and should be moved to inpatient because: Diarrhea      Medications: Scheduled:  allopurinol  100 mg Oral BID   atorvastatin  10 mg Oral QPM   busPIRone  5 mg Oral TID   cefadroxil  500 mg Oral Daily   doxycycline  100 mg Oral BID   gabapentin  100 mg Oral TID   Gerhardt's butt cream   Topical TID   heparin  5,000 Units Subcutaneous Q8H   insulin aspart  0-6 Units Subcutaneous TID WC   metoprolol tartrate  50 mg Oral BID   metroNIDAZOLE  500 mg Oral Q12H   mirtazapine  15 mg Oral QHS  pantoprazole  40 mg Oral BID   sodium chloride flush  3 mL Intravenous Q12H   Continuous:  anticoagulant sodium citrate     KVQ:[id number] **OR** acetaminophen, alteplase, anticoagulant sodium citrate, heparin, [COMPLETED]   HYDROmorphone (DILAUDID) injection **FOLLOWED BY** HYDROmorphone (DILAUDID) injection, HYDROmorphone, hydrOXYzine, lidocaine (PF), lidocaine-prilocaine, pentafluoroprop-tetrafluoroeth, polyethylene glycol  Antibiotics: Anti-infectives (From admission, onward)    Start     Dose/Rate Route Frequency Ordered Stop   03/01/23 1000  cefadroxil (DURICEF) capsule 500 mg        500 mg Oral Daily 02/28/23 1533     02/28/23 2200  doxycycline (VIBRA-TABS) tablet 100 mg        100 mg Oral 2 times daily 02/28/23 1533     02/28/23 2200  metroNIDAZOLE (FLAGYL) tablet 500 mg        500 mg Oral Every 12 hours 02/28/23 1533         Objective:  Vital Signs  Vitals:   03/01/23 0000 03/01/23 0451 03/01/23 0606 03/01/23 0825  BP:   (!) 146/79 126/76  Pulse: 86  93 96  Resp: (!) 23  (!) 21 20  Temp:   98 F (36.7 C) 98.2 F (36.8 C)  TempSrc:   Oral Oral  SpO2: 100%  100%   Weight:  111.9 kg    Height:       No intake or output data in the 24 hours ending 03/01/23 0921 Filed Weights   02/28/23 1039 03/01/23 0451  Weight: 113.4 kg 111.9 kg    General appearance: Awake alert.  In no distress Resp: Clear to auscultation bilaterally.  Normal effort Cardio: S1-S2 is normal regular.  No S3-S4.  No rubs murmurs or bruit GI: Abdomen is soft.  Tender in the lower abdomen without any rebound rigidity or guarding. Extremities: Right BKA stump noted.  No active infection identified. Neurologic: No focal neurological deficits.    Lab Results:  Data Reviewed: I have personally reviewed following labs and reports of the imaging studies  CBC: Recent Labs  Lab 02/28/23 1129 02/28/23 1139 03/01/23 0446  WBC 17.2*  --  14.9*  NEUTROABS 12.9*  --   --   HGB 9.1* 9.9* 9.7*  HCT 29.8* 29.0* 31.8*  MCV 96.1  --  95.2  PLT 423*  --  346    Basic Metabolic Panel: Recent Labs  Lab 02/28/23 1129 02/28/23 1139 03/01/23 0446  NA 141 144 141  K 3.0* 3.0* 2.9*  CL 114* 114* 113*  CO2 13*  --  12*   GLUCOSE 132* 128* 145*  BUN 27* 31* 28*  CREATININE 5.36* 5.80* 5.30*  CALCIUM 7.9*  --  7.9*    GFR: Estimated Creatinine Clearance: 14.7 mL/min (A) (by C-G formula based on SCr of 5.3 mg/dL (H)).  Liver Function Tests: Recent Labs  Lab 02/28/23 1129 03/01/23 0446  AST 26 21  ALT 14 15  ALKPHOS 73 73  BILITOT 0.6 0.6  PROT 7.0 6.4*  ALBUMIN 2.5* 2.3*    Recent Labs  Lab 02/28/23 1129  LIPASE 40     CBG: Recent Labs  Lab 02/28/23 1016 02/28/23 1706 02/28/23 2106 03/01/23 0847  GLUCAP 113* 165* 129* 119*     Recent Results (from the past 240 hours)  C Difficile Quick Screen w PCR reflex     Status: None   Collection Time: 02/28/23 11:16 AM   Specimen: Stool  Result Value Ref Range Status   C Diff antigen NEGATIVE NEGATIVE Final  C Diff toxin NEGATIVE NEGATIVE Final   C Diff interpretation No C. difficile detected.  Final    Comment: Performed at [facility], [address] [place], [place] [address]      Radiology Studies: CT ABDOMEN PELVIS WO CONTRAST Result Date: 02/28/2023 CLINICAL DATA:  Right-sided abdominal pain. EXAM: CT ABDOMEN AND PELVIS WITHOUT CONTRAST TECHNIQUE: Multidetector CT imaging of the abdomen and pelvis was performed following the standard protocol without IV contrast. RADIATION DOSE REDUCTION: This exam was performed according to the departmental dose-optimization program which includes automated exposure control, adjustment of the mA and/or kV according to patient size and/or use of iterative reconstruction technique. COMPARISON:  02/17/2023 and earlier studies dating to 01/11/2020 FINDINGS: Lower chest: No acute findings. Hepatobiliary:  No mass visualized on this unenhanced exam. Pancreas: No mass or inflammatory process visualized on this unenhanced exam. Spleen:  Within normal limits in size. Adrenals/Urinary tract: No evidence of urolithiasis or hydronephrosis. Unremarkable unopacified urinary bladder. Stomach/Bowel: Image  degradation by motion artifact noted in the lower abdomen and pelvis. No evidence of obstruction, inflammatory process, or abnormal fluid collections. Vascular/Lymphatic: No pathologically enlarged lymph nodes identified. No evidence of abdominal aortic aneurysm. Reproductive: Stable 4.7 cm mass contiguous with the left uterine fundus, consistent with a pedunculated fibroid. No evidence of inflammatory changes or ascites. Other:  None. Musculoskeletal:  No suspicious bone lesions identified. IMPRESSION: No acute findings. Stable pedunculated uterine fibroid. Electronically Signed   By: Danae Orleans M.D.   On: 02/28/2023 13:48   DG Chest Portable 1 View Result Date: 02/28/2023 CLINICAL DATA:  Chest pain. EXAM: PORTABLE CHEST 1 VIEW COMPARISON:  January 31, 2023. FINDINGS: Stable cardiomediastinal silhouette. Right internal jugular dialysis catheter is unchanged. Both lungs are clear. The visualized skeletal structures are unremarkable. IMPRESSION: No active disease. Electronically Signed   By: Lupita Raider M.D.   On: 02/28/2023 12:32   DG Knee Right Port Result Date: 02/28/2023 CLINICAL DATA:  Amputation assessment. EXAM: PORTABLE RIGHT KNEE - 1-2 VIEW COMPARISON:  May 12, 2022. FINDINGS: Status post right below-knee amputation. Surgical staples are seen in the stomach. Stable degenerative changes seen involving the medial and lateral joint spaces. Stable osteochondroma arising medially from proximal tibia. IMPRESSION: Status post right below-knee amputation. Stable degenerative changes of right knee joint as well as osteo chondroma arising from proximal tibia. Electronically Signed   By: Lupita Raider M.D.   On: 02/28/2023 12:31       LOS: 0 days   Koren Plyler Rito Ehrlich  [facility] Pager on [email or web address]  03/01/2023, 9:21 AM

## 2023-03-01 NOTE — Progress Notes (Signed)
Deborah [name] is an 57 y.o. female w/ sciatica, carcinoid tumor of the duodenum, HFrEF, DM, gastroparesis, HTN, NICM, PAF, CVA, ESRD dialyzing Tuesday Thursday Saturdays at [facility] with Dr. Signe Colt.  Patient missed dialysis on Saturday because she was not feeling well mainly because of the pain having run out of her opioids.  Patient has a appointment with pain clinic in the next few weeks.  Patient has some mild shortness of breath but the main complaint right now is pain all over especially in the abdomen and right leg.    OP HD: TTS [facility]  4h  400/800  112kg   3K/2.5Ca bath   TDC   Heparin none - last OP HD 12/19 left at 11.6 kg  - has been usually reaching dry wt - venofer 100mg  q hd thru 12/04 - mircera 100 mcg q 2 wks, last 10/5, due 10/19   Assessment/Plan: ESKD - on HD TTS. Last HD Th 12/19. Labs and vol okay. Will plan HD here today; patient refused dialysis yesterday; then Th Sat per holiday schedule.  HTN - bp's wnl, takes metoprolol at home. Cont meds as needed.  Volume - no gross vol excess on exam. Plan UF 2 L w/ HD today.  Anemia of eskd - Hb 9.9 MBD ckd - CCa in range. Add on phos. Binders w/ meals.  Afib - not on anticoagulation because of GI bleeding. Chronic pain syndrome on narcotics PAD with rt BKA 10/23 with h/o stump dehiscence.    Subjective: Patient refused dialysis overnight but is willing to go this morning; patient denies any fever chills nausea vomiting. Has mild shortness of breath.   Chemistry and CBC: Creatinine, Ser  Date/Time Value Ref Range Status  03/01/2023 04:46 AM 5.30 (H) 0.44 - 1.00 mg/dL Final  16/12/9602 54:09 AM 5.80 (H) 0.44 - 1.00 mg/dL Final  81/19/1478 29:56 AM 5.36 (H) 0.44 - 1.00 mg/dL Final  21/30/8657 84:69 AM 5.92 (H) 0.44 - 1.00 mg/dL Final  62/95/2841 32:44 PM 5.83 (H) 0.44 - 1.00 mg/dL Final  03/10/7251 66:44 AM 5.50 (H) 0.44 - 1.00 mg/dL Final  03/47/4259 56:38 AM 4.88 (H) 0.44 - 1.00 mg/dL Final  75/64/3329 51:88 PM  4.81 (H) 0.44 - 1.00 mg/dL Final  41/66/0630 16:01 AM 5.17 (H) 0.44 - 1.00 mg/dL Final  09/32/3557 32:20 AM 3.95 (H) 0.44 - 1.00 mg/dL Final  25/42/7062 37:62 AM 3.92 (H) 0.44 - 1.00 mg/dL Final  83/15/1761 60:73 AM 3.36 (H) 0.44 - 1.00 mg/dL Final  71/08/2692 85:46 AM 4.71 (H) 0.44 - 1.00 mg/dL Final  27/05/5007 38:18 AM 4.09 (H) 0.44 - 1.00 mg/dL Final  29/93/7169 67:89 AM 5.91 (H) 0.44 - 1.00 mg/dL Final  38/12/1749 02:58 PM 5.50 (H) 0.44 - 1.00 mg/dL Final  52/77/8242 35:36 PM 6.45 (H) 0.44 - 1.00 mg/dL Final  14/43/1540 08:67 PM 5.39 (H) 0.44 - 1.00 mg/dL Final  61/95/0932 67:12 PM 4.09 (H) 0.44 - 1.00 mg/dL Final  45/80/9983 38:25 AM 5.07 (H) 0.44 - 1.00 mg/dL Final  05/39/7673 41:93 AM 5.25 (H) 0.44 - 1.00 mg/dL Final  79/04/4095 35:32 AM 4.31 (H) 0.44 - 1.00 mg/dL Final    Comment:    DELTA NOTED. OK TO RELEASE RESULT PER ROSS,T RN.  01/06/2023 04:31 AM 2.94 (H) 0.44 - 1.00 mg/dL Final  99/24/2683 41:96 AM 4.26 (H) 0.44 - 1.00 mg/dL Final  22/29/7989 21:19 PM 4.72 (H) 0.44 - 1.00 mg/dL Final  41/74/0814 48:18 AM 4.49 (H) 0.44 - 1.00 mg/dL Final  12/31/2022 03:05 PM 4.19 (H) 0.44 - 1.00 mg/dL Final  21/30/8657 84:69 AM 4.20 (H) 0.44 - 1.00 mg/dL Final  62/95/2841 32:44 AM 3.41 (H) 0.44 - 1.00 mg/dL Final  03/10/7251 66:44 PM 4.30 (H) 0.44 - 1.00 mg/dL Final  03/47/4259 56:38 PM 3.48 (H) 0.44 - 1.00 mg/dL Final  75/64/3329 51:88 AM 4.70 (H) 0.44 - 1.00 mg/dL Final  41/66/0630 16:01 AM 4.09 (H) 0.44 - 1.00 mg/dL Final  09/32/3557 32:20 AM 4.19 (H) 0.44 - 1.00 mg/dL Final  25/42/7062 37:62 AM 3.93 (H) 0.44 - 1.00 mg/dL Final  83/15/1761 60:73 AM 5.61 (H) 0.44 - 1.00 mg/dL Final  71/08/2692 85:46 AM 5.58 (H) 0.44 - 1.00 mg/dL Final  27/05/5007 38:18 AM 5.98 (H) 0.44 - 1.00 mg/dL Final  29/93/7169 67:89 AM 6.74 (H) 0.44 - 1.00 mg/dL Final  38/12/1749 02:58 PM 6.87 (H) 0.44 - 1.00 mg/dL Final  52/77/8242 35:36 AM 2.36 (H) 0.44 - 1.00 mg/dL Final  14/43/1540 08:67 PM 3.89 (H) 0.44  - 1.00 mg/dL Final  61/95/0932 67:12 AM 3.81 (H) 0.44 - 1.00 mg/dL Final  45/80/9983 38:25 PM 3.88 (H) 0.44 - 1.00 mg/dL Final  05/39/7673 41:93 AM 3.14 (H) 0.44 - 1.00 mg/dL Final  79/04/4095 35:32 AM 4.06 (H) 0.44 - 1.00 mg/dL Final  99/24/2683 41:96 AM 3.54 (H) 0.44 - 1.00 mg/dL Final  22/29/7989 21:19 AM 5.01 (H) 0.44 - 1.00 mg/dL Final  41/74/0814 48:18 AM 10.14 (H) 0.44 - 1.00 mg/dL Final  56/31/4970 26:37 PM 5.68 (H) 0.44 - 1.00 mg/dL Final  85/88/5027 74:12 PM 3.83 (H) 0.44 - 1.00 mg/dL Final  87/86/7672 09:47 AM 1.99 (H) 0.44 - 1.00 mg/dL Final    Comment:    DIALYSIS   Recent Labs  Lab 02/28/23 1129 02/28/23 1139 03/01/23 0446  NA 141 144 141  K 3.0* 3.0* 2.9*  CL 114* 114* 113*  CO2 13*  --  12*  GLUCOSE 132* 128* 145*  BUN 27* 31* 28*  CREATININE 5.36* 5.80* 5.30*  CALCIUM 7.9*  --  7.9*   Recent Labs  Lab 02/28/23 1129 02/28/23 1139 03/01/23 0446  WBC 17.2*  --  14.9*  NEUTROABS 12.9*  --   --   HGB 9.1* 9.9* 9.7*  HCT 29.8* 29.0* 31.8*  MCV 96.1  --  95.2  PLT 423*  --  346   Liver Function Tests: Recent Labs  Lab 02/28/23 1129 03/01/23 0446  AST 26 21  ALT 14 15  ALKPHOS 73 73  BILITOT 0.6 0.6  PROT 7.0 6.4*  ALBUMIN 2.5* 2.3*   Recent Labs  Lab 02/28/23 1129  LIPASE 40   No results for input(s): "AMMONIA" in the last 168 hours. Cardiac Enzymes: No results for input(s): "CKTOTAL", "CKMB", "CKMBINDEX", "TROPONINI" in the last 168 hours. Iron Studies: No results for input(s): "IRON", "TIBC", "TRANSFERRIN", "FERRITIN" in the last 72 hours. PT/INR: @LABRCNTIP (inr:5)  Xrays/Other Studies: ) Results for orders placed or performed during the hospital encounter of 02/28/23 (from the past 48 hours)  CBG monitoring, ED     Status: Abnormal   Collection Time: 02/28/23 10:16 AM  Result Value Ref Range   Glucose-Capillary 113 (H) 70 - 99 mg/dL    Comment: Glucose reference range applies only to samples taken after fasting for at least 8 hours.    Comment 1 Notify RN    Comment 2 Document in Chart   C Difficile Quick Screen w PCR reflex     Status: None   Collection Time: 02/28/23  11:16 AM   Specimen: Stool  Result Value Ref Range   C Diff antigen NEGATIVE NEGATIVE   C Diff toxin NEGATIVE NEGATIVE   C Diff interpretation No C. difficile detected.     Comment: Performed at [facility], [address], [place], [place] [address]  CBC with Differential     Status: Abnormal   Collection Time: 02/28/23 11:29 AM  Result Value Ref Range   WBC 17.2 (H) 4.0 - 10.5 K/uL   RBC 3.10 (L) 3.87 - 5.11 MIL/uL   Hemoglobin 9.1 (L) 12.0 - 15.0 g/dL   HCT 69.6 (L) 29.5 - 28.4 %   MCV 96.1 80.0 - 100.0 fL   MCH 29.4 26.0 - 34.0 pg   MCHC 30.5 30.0 - 36.0 g/dL   RDW 13.2 (H) 44.0 - 10.2 %   Platelets 423 (H) 150 - 400 K/uL   nRBC 0.0 0.0 - 0.2 %   Neutrophils Relative % 75 %   Neutro Abs 12.9 (H) 1.7 - 7.7 K/uL   Lymphocytes Relative 16 %   Lymphs Abs 2.8 0.7 - 4.0 K/uL   Monocytes Relative 5 %   Monocytes Absolute 0.8 0.1 - 1.0 K/uL   Eosinophils Relative 2 %   Eosinophils Absolute 0.4 0.0 - 0.5 K/uL   Basophils Relative 1 %   Basophils Absolute 0.1 0.0 - 0.1 K/uL   Immature Granulocytes 1 %   Abs Immature Granulocytes 0.14 (H) 0.00 - 0.07 K/uL    Comment: Performed at [facility], [address], [place], [place] [address]  Comprehensive metabolic panel     Status: Abnormal   Collection Time: 02/28/23 11:29 AM  Result Value Ref Range   Sodium 141 135 - 145 mmol/L   Potassium 3.0 (L) 3.5 - 5.1 mmol/L   Chloride 114 (H) 98 - 111 mmol/L   CO2 13 (L) 22 - 32 mmol/L   Glucose, Bld 132 (H) 70 - 99 mg/dL    Comment: Glucose reference range applies only to samples taken after fasting for at least 8 hours.   BUN 27 (H) 6 - 20 mg/dL   Creatinine, Ser 6.44 (H) 0.44 - 1.00 mg/dL   Calcium 7.9 (L) 8.9 - 10.3 mg/dL   Total Protein 7.0 6.5 - 8.1 g/dL   Albumin 2.5 (L) 3.5 - 5.0 g/dL   AST 26 15 - 41 U/L   ALT 14 0 - 44 U/L    Alkaline Phosphatase 73 38 - 126 U/L   Total Bilirubin 0.6 <1.2 mg/dL   GFR, Estimated 9 (L) >60 mL/min    Comment: (NOTE) Calculated using the CKD-EPI Creatinine Equation (2021)    Anion gap 14 5 - 15    Comment: Performed at [facility], [address] [place], [place] [address]  Lipase, blood     Status: None   Collection Time: 02/28/23 11:29 AM  Result Value Ref Range   Lipase 40 11 - 51 U/L    Comment: Performed at [facility], [address] [place], [place] [address]  Troponin I (High Sensitivity)     Status: None   Collection Time: 02/28/23 11:29 AM  Result Value Ref Range   Troponin I (High Sensitivity) 15 <18 ng/L    Comment: (NOTE) Elevated high sensitivity troponin I (hsTnI) values and significant  changes across serial measurements may suggest ACS but many other  chronic and acute conditions are known to elevate hsTnI results.  Refer to the "  Links" section for chest pain algorithms and additional  guidance. Performed at [facility], [address] [place], [place] [address]   Lactic acid, plasma     Status: Abnormal   Collection Time: 02/28/23 11:29 AM  Result Value Ref Range   Lactic Acid, Venous 2.0 (HH) 0.5 - 1.9 mmol/L    Comment: CRITICAL RESULT CALLED TO, READ BACK BY AND VERIFIED WITH T. Shropshire RN , @1216 , 02/28/23, Dabdee, T. Performed at [facility], [address] [place], [place] [address]   I-stat chem 8, ED (not at [facility], [facility] or [facility])     Status: Abnormal   Collection Time: 02/28/23 11:39 AM  Result Value Ref Range   Sodium 144 135 - 145 mmol/L   Potassium 3.0 (L) 3.5 - 5.1 mmol/L   Chloride 114 (H) 98 - 111 mmol/L   BUN 31 (H) 6 - 20 mg/dL   Creatinine, Ser 5.28 (H) 0.44 - 1.00 mg/dL   Glucose, Bld 413 (H) 70 - 99 mg/dL    Comment: Glucose reference range applies only to samples taken after fasting for at least 8 hours.   Calcium, Ion 0.96 (L) 1.15 - 1.40 mmol/L   TCO2 15 (L) 22 - 32 mmol/L   Hemoglobin 9.9 (L)  12.0 - 15.0 g/dL   HCT 24.4 (L) 01.0 - 27.2 %  CBG monitoring, ED     Status: Abnormal   Collection Time: 02/28/23  5:06 PM  Result Value Ref Range   Glucose-Capillary 165 (H) 70 - 99 mg/dL    Comment: Glucose reference range applies only to samples taken after fasting for at least 8 hours.  Lactic acid, plasma     Status: Abnormal   Collection Time: 02/28/23  6:48 PM  Result Value Ref Range   Lactic Acid, Venous 2.6 (HH) 0.5 - 1.9 mmol/L    Comment: CRITICAL RESULT CALLED TO, READ BACK BY AND VERIFIED WITH MANISHA L. RN @1925  ON 02/28/23 BY MAB Performed at [facility], [address] [place], [place] [address]   Troponin I (High Sensitivity)     Status: None   Collection Time: 02/28/23  6:48 PM  Result Value Ref Range   Troponin I (High Sensitivity) 13 <18 ng/L    Comment: (NOTE) Elevated high sensitivity troponin I (hsTnI) values and significant  changes across serial measurements may suggest ACS but many other  chronic and acute conditions are known to elevate hsTnI results.  Refer to the "Links" section for chest pain algorithms and additional  guidance. Performed at [facility], [address], [place], [place] [address]   Hepatitis B surface antigen     Status: None   Collection Time: 02/28/23  6:48 PM  Result Value Ref Range   Hepatitis B Surface Ag NON REACTIVE NON REACTIVE    Comment: Performed at [facility], [address], [place], [place] [address]  Glucose, capillary     Status: Abnormal   Collection Time: 02/28/23  9:06 PM  Result Value Ref Range   Glucose-Capillary 129 (H) 70 - 99 mg/dL    Comment: Glucose reference range applies only to samples taken after fasting for at least 8 hours.  Comprehensive metabolic panel     Status: Abnormal   Collection Time: 03/01/23  4:46 AM  Result Value Ref Range   Sodium 141 135 - 145 mmol/L   Potassium 2.9 (L) 3.5 - 5.1 mmol/L   Chloride 113 (H) 98 -  111 mmol/L   CO2 12 (L) 22 - 32 mmol/L    Glucose, Bld 145 (H) 70 - 99 mg/dL    Comment: Glucose reference range applies only to samples taken after fasting for at least 8 hours.   BUN 28 (H) 6 - 20 mg/dL   Creatinine, Ser 2.20 (H) 0.44 - 1.00 mg/dL   Calcium 7.9 (L) 8.9 - 10.3 mg/dL   Total Protein 6.4 (L) 6.5 - 8.1 g/dL   Albumin 2.3 (L) 3.5 - 5.0 g/dL   AST 21 15 - 41 U/L   ALT 15 0 - 44 U/L   Alkaline Phosphatase 73 38 - 126 U/L   Total Bilirubin 0.6 <1.2 mg/dL   GFR, Estimated 9 (L) >60 mL/min    Comment: (NOTE) Calculated using the CKD-EPI Creatinine Equation (2021)    Anion gap 16 (H) 5 - 15    Comment: Performed at [facility], [address] [place], [place] [address]  CBC     Status: Abnormal   Collection Time: 03/01/23  4:46 AM  Result Value Ref Range   WBC 14.9 (H) 4.0 - 10.5 K/uL   RBC 3.34 (L) 3.87 - 5.11 MIL/uL   Hemoglobin 9.7 (L) 12.0 - 15.0 g/dL   HCT 06.2 (L) 37.6 - 28.3 %   MCV 95.2 80.0 - 100.0 fL   MCH 29.0 26.0 - 34.0 pg   MCHC 30.5 30.0 - 36.0 g/dL   RDW 15.1 (H) 76.1 - 60.7 %   Platelets 346 150 - 400 K/uL   nRBC 0.0 0.0 - 0.2 %    Comment: Performed at [facility], [address], [place], [place] [address]   CT ABDOMEN PELVIS WO CONTRAST Result Date: 02/28/2023 CLINICAL DATA:  Right-sided abdominal pain. EXAM: CT ABDOMEN AND PELVIS WITHOUT CONTRAST TECHNIQUE: Multidetector CT imaging of the abdomen and pelvis was performed following the standard protocol without IV contrast. RADIATION DOSE REDUCTION: This exam was performed according to the departmental dose-optimization program which includes automated exposure control, adjustment of the mA and/or kV according to patient size and/or use of iterative reconstruction technique. COMPARISON:  02/17/2023 and earlier studies dating to 01/11/2020 FINDINGS: Lower chest: No acute findings. Hepatobiliary:  No mass visualized on this unenhanced exam. Pancreas: No mass or inflammatory process visualized on this unenhanced exam. Spleen:  Within  normal limits in size. Adrenals/Urinary tract: No evidence of urolithiasis or hydronephrosis. Unremarkable unopacified urinary bladder. Stomach/Bowel: Image degradation by motion artifact noted in the lower abdomen and pelvis. No evidence of obstruction, inflammatory process, or abnormal fluid collections. Vascular/Lymphatic: No pathologically enlarged lymph nodes identified. No evidence of abdominal aortic aneurysm. Reproductive: Stable 4.7 cm mass contiguous with the left uterine fundus, consistent with a pedunculated fibroid. No evidence of inflammatory changes or ascites. Other:  None. Musculoskeletal:  No suspicious bone lesions identified. IMPRESSION: No acute findings. Stable pedunculated uterine fibroid. Electronically Signed   By: Danae Orleans M.D.   On: 02/28/2023 13:48   DG Chest Portable 1 View Result Date: 02/28/2023 CLINICAL DATA:  Chest pain. EXAM: PORTABLE CHEST 1 VIEW COMPARISON:  January 31, 2023. FINDINGS: Stable cardiomediastinal silhouette. Right internal jugular dialysis catheter is unchanged. Both lungs are clear. The visualized skeletal structures are unremarkable. IMPRESSION: No active disease. Electronically Signed   By: Lupita Raider M.D.   On: 02/28/2023 12:32   DG Knee Right Port Result Date: 02/28/2023 CLINICAL DATA:  Amputation assessment. EXAM: PORTABLE RIGHT KNEE - 1-2 VIEW COMPARISON:  May 12, 2022. FINDINGS: Status post right below-knee amputation. Surgical staples are seen in the stomach. Stable degenerative changes seen involving the medial and lateral joint spaces. Stable osteochondroma arising medially from proximal tibia. IMPRESSION: Status post right below-knee amputation. Stable degenerative changes of right knee joint as well as osteo chondroma arising from proximal tibia. Electronically Signed   By: Lupita Raider M.D.   On: 02/28/2023 12:31    PMH:   Past Medical History:  Diagnosis Date   Acute back pain with sciatica, left    Acute back pain with  sciatica, right    Acute encephalopathy 05/29/2022   Acute osteomyelitis of right calcaneus (HCC) 12/21/2022   AKI (acute kidney injury) (HCC)    Anemia, unspecified    Atrial fibrillation with RVR (HCC)-resolved 09/09/2022   Atypical chest pain 09/10/2021   Cancer (HCC)    Carcinoid tumor of duodenum    Chest pain with normal coronary angiography 2019   Chronic a-fib (HCC) 09/09/2022   Chronic kidney disease, stage 3b (HCC)    Chronic pain    Chronic systolic CHF (congestive heart failure) (HCC)    Dehiscence of amputation stump of right lower extremity (HCC) 01/29/2023   Diabetes mellitus    DKA (diabetic ketoacidosis) (HCC)    Drug-seeking behavior    21 hospitalizations and 14 CT a/p in 2 years for N/V and abdominal pain, demanding only IV dilaudid   Elevated troponin    chronic   Esophageal reflux    Fibromyalgia    Gastric ulcer    Gastroparesis    Gout    HCAP (healthcare-associated pneumonia) 06/19/2022   Hyperlipidemia    Hyperosmolar hyperglycemic state (HHS) (HCC) 05/11/2022   Hyperosmolar non-ketotic state due to type 2 diabetes mellitus (HCC) 05/11/2022   Hypertension    Hypokalemia    Hypomagnesemia    Lumbosacral stenosis    LVH (left ventricular hypertrophy)    Morbid obesity (HCC)    Nausea & vomiting 09/09/2022   NICM (nonischemic cardiomyopathy) (HCC)    PAF (paroxysmal atrial fibrillation) (HCC)    Sepsis (HCC) 11/23/2022   Stroke (HCC) 02/2011   Symptomatic anemia 12/14/2022   Thrombocytosis    Vitamin B12 deficiency anemia     PSH:   Past Surgical History:  Procedure Laterality Date   ABDOMINAL AORTOGRAM W/LOWER EXTREMITY N/A 11/29/2022   Procedure: ABDOMINAL AORTOGRAM W/LOWER EXTREMITY;  Surgeon: Daria Pastures, MD;  Location: [facility];  Service: Cardiovascular;  Laterality: N/A;   AMPUTATION Right 12/29/2022   Procedure: RIGHT BELOW KNEE AMPUTATION;  Surgeon: Nadara Mustard, MD;  Location: [facility] OR;  Service: Orthopedics;   Laterality: Right;   AV FISTULA PLACEMENT Left 06/30/2022   Procedure: LEFT BRACHIOCEPHALIC ARTERIOVENOUS (AV) FISTULA CREATION;  Surgeon: Cephus Shelling, MD;  Location: [facility] OR;  Service: Vascular;  Laterality: Left;   BIOPSY  07/27/2019   Procedure: BIOPSY;  Surgeon: Vida Rigger, MD;  Location: [facility];  Service: Endoscopy;;   BIOPSY  07/30/2019   Procedure: BIOPSY;  Surgeon: Kathi Der, MD;  Location: [facility];  Service: Gastroenterology;;   CATARACT EXTRACTION  01/2014   CHOLECYSTECTOMY     COLONOSCOPY WITH PROPOFOL N/A 07/30/2019   Procedure: COLONOSCOPY WITH PROPOFOL;  Surgeon: Kathi Der, MD;  Location: [facility];  Service: Gastroenterology;  Laterality: N/A;   ESOPHAGOGASTRODUODENOSCOPY N/A 07/27/2019   Procedure: ESOPHAGOGASTRODUODENOSCOPY (EGD);  Surgeon: Vida Rigger, MD;  Location: [facility];  Service: Endoscopy;  Laterality: N/A;   ESOPHAGOGASTRODUODENOSCOPY N/A 07/26/2020  Procedure: ESOPHAGOGASTRODUODENOSCOPY (EGD);  Surgeon: Willis Modena, MD;  Location: [facility];  Service: Endoscopy;  Laterality: N/A;   ESOPHAGOGASTRODUODENOSCOPY (EGD) WITH PROPOFOL N/A 08/02/2019   Procedure: ESOPHAGOGASTRODUODENOSCOPY (EGD) WITH PROPOFOL;  Surgeon: Kathi Der, MD;  Location: [facility];  Service: Gastroenterology;  Laterality: N/A;   ESOPHAGOGASTRODUODENOSCOPY (EGD) WITH PROPOFOL N/A 12/23/2022   Procedure: ESOPHAGOGASTRODUODENOSCOPY (EGD) WITH PROPOFOL;  Surgeon: Shellia Cleverly, DO;  Location: [facility];  Service: Gastroenterology;  Laterality: N/A;   FISTULA SUPERFICIALIZATION Left 12/31/2022   Procedure: LEFT ARM FISTULA TRANSPOSITION;  Surgeon: Maeola Harman, MD;  Location: [facility] OR;  Service: Vascular;  Laterality: Left;   GIVENS CAPSULE STUDY N/A 12/23/2022   Procedure: GIVENS CAPSULE STUDY;  Surgeon: Shellia Cleverly, DO;  Location: [facility];  Service: Gastroenterology;  Laterality: N/A;   HEMOSTASIS CLIP PLACEMENT   08/02/2019   Procedure: HEMOSTASIS CLIP PLACEMENT;  Surgeon: Kathi Der, MD;  Location: [facility];  Service: Gastroenterology;;   HOT HEMOSTASIS N/A 12/23/2022   Procedure: HOT HEMOSTASIS (ARGON PLASMA COAGULATION/BICAP);  Surgeon: Shellia Cleverly, DO;  Location: [facility];  Service: Gastroenterology;  Laterality: N/A;   IR FLUORO GUIDE CV LINE RIGHT  06/24/2022   IR US GUIDE VASC ACCESS RIGHT  06/24/2022   POLYPECTOMY  07/30/2019   Procedure: POLYPECTOMY;  Surgeon: Kathi Der, MD;  Location: [facility];  Service: Gastroenterology;;   POLYPECTOMY  08/02/2019   Procedure: POLYPECTOMY;  Surgeon: Kathi Der, MD;  Location: [facility];  Service: Gastroenterology;;   STUMP REVISION Right 02/02/2023   Procedure: REVISION RIGHT BELOW KNEE AMPUTATION;  Surgeon: Nadara Mustard, MD;  Location: [facility] OR;  Service: Orthopedics;  Laterality: Right;    Allergies:  Allergies  Allergen Reactions   Isovue [Iopamidol] Anaphylaxis, [name] and Other (See Comments)    11/28/17 Patient had seizure like activity and then 1 min code after 100 cc of isovue 300. Possible contrast allergy vs vasovagal episode  Cardiac Arrest   Nsaids Anaphylaxis and Other (See Comments)    Hx of stomach ulcers   Penicillins Itching, Palpitations and Other (See Comments)    Flushing (Red Skin) Laryngeal Edema   Reglan [Metoclopramide] Other (See Comments)    Tardive dyskinesia    Valium [Diazepam] [name]   Zestril [Lisinopril] Anaphylaxis and Swelling    Tongue and mouth swelling Laryngeal Edema   Tolectin [Tolmetin] Nausea And Vomiting, Nausea Only and Other (See Comments)    Irritates stomach ulcer   Asa [Aspirin] Other (See Comments)    Hx of stomach ulcer   Aspartame And Phenylalanine Hives   Bentyl [Dicyclomine] Other (See Comments)    Chest pain   Hibiclens [Chlorhexidine Gluconate] Other (See Comments)    Dermatitis    Flexeril [Cyclobenzaprine] Palpitations    Oxycontin [Oxycodone] Palpitations   Rifamycins Palpitations   Tylenol [Acetaminophen] Nausea And Vomiting, Nausea Only and Other (See Comments)    Irritates stomach ulcer Abdominal pain   Ultram [Tramadol] Nausea And Vomiting and Palpitations    Medications:   Prior to Admission medications   Medication Sig Start Date End Date Taking? Authorizing Provider  albuterol (PROVENTIL) (2.5 MG/3ML) 0.083% nebulizer solution Take 3 mLs (2.5 mg total) by nebulization every 6 (six) hours as needed for wheezing or shortness of breath. 04/06/19  Yes Bing Neighbors, NP  allopurinol (ZYLOPRIM) 100 MG tablet Take 1 tablet (100 mg total) by mouth 2 (two) times daily. 11/30/22  Yes Azucena Fallen, MD  ascorbic acid (VITAMIN C) 500 MG  tablet Take 500 mg by mouth daily.   Yes [provider]  atorvastatin (LIPITOR) 10 MG tablet Take 10 mg by mouth every evening. 12/03/21  Yes [provider]  busPIRone (BUSPAR) 5 MG tablet Take 5 mg by mouth 3 (three) times daily.   Yes [provider]  carvedilol (COREG) 12.5 MG tablet Take 12.5 mg by mouth 2 (two) times daily with a meal. 02/18/23  Yes [provider]  cefadroxil (DURICEF) 500 MG capsule Take 1 capsule (500 mg total) by mouth daily for 14 days. 02/23/23  Yes Leroy Sea, MD  cetirizine (ZYRTEC) 10 MG tablet Take 10 mg by mouth daily. 08/05/22  Yes [provider]  doxycycline (VIBRA-TABS) 100 MG tablet Take 1 tablet (100 mg total) by mouth 2 (two) times daily. 02/23/23  Yes Leroy Sea, MD  ELIQUIS 5 MG TABS tablet Take 5 mg by mouth 2 (two) times daily. 02/18/23  Yes [provider]  famotidine (PEPCID) 20 MG tablet Take 20 mg by mouth daily before breakfast.   Yes [provider]  ferric citrate (AURYXIA) 1 GM 210 MG(Fe) tablet Take 2 tablets (420 mg total) by mouth 3 (three) times daily with meals. Patient taking differently: Take 210 mg by mouth 3 (three) times daily with  meals. 11/30/22  Yes Azucena Fallen, MD  fluticasone [facility]) 50 MCG/ACT nasal spray Place 2 sprays into both nostrils daily as needed for allergies or rhinitis. 12/19/18  Yes Rai, Ripudeep K, MD  folic acid (FOLVITE) 1 MG tablet Take 1 tablet (1 mg total) by mouth daily. 01/10/23  Yes Danford, Earl Lites, MD  gabapentin (NEURONTIN) 100 MG capsule Take 1 capsule (100 mg total) by mouth 3 (three) times daily. 11/30/22  Yes Azucena Fallen, MD  hydrALAZINE (APRESOLINE) 50 MG tablet Take 50 mg by mouth 3 (three) times daily. 02/18/23  Yes [provider]  hydrOXYzine (ATARAX) 25 MG tablet Take 1 tablet (25 mg total) by mouth 3 (three) times daily as needed for anxiety. Patient taking differently: Take 25 mg by mouth See admin instructions. Give 25 mg (1 tablet) every 6 hours as needed for anxiety, agitation for 14 days (starting 11/19), then every 8 hours as needed. 01/10/23  Yes Danford, Earl Lites, MD  insulin glargine (SEMGLEE, YFGN,) 100 UNIT/ML Solostar Pen Inject 8 Units into the skin at bedtime.   Yes [provider]  insulin lispro (HUMALOG) 100 UNIT/ML KwikPen Before each meal 3 times a day, 140-199 - 2 units, 200-250 - 6 units, 251-299 - 8 units,  300-349 - 12 units,  350 or above 14 units. Patient taking differently: Inject 0-14 Units into the skin See admin instructions. Inject 0-14 units per sliding scale before meals: < 70 notify MD 70-139 : 0 units 140-199 : 2 units 200-250 : 6 units 251-299 : 8 units 300-349 : 12 units 350-400 : 14 units > 401 notify MD. 06/04/22  Yes Leroy Sea, MD  isosorbide mononitrate (IMDUR) 60 MG 24 hr tablet Take 60 mg by mouth daily. 02/18/23  Yes [provider]  loperamide (IMODIUM) 2 MG capsule Take 1 capsule (2 mg total) by mouth as needed for diarrhea or loose stools. Patient taking differently: Take 2 mg by mouth daily as needed for diarrhea or loose stools. 01/10/23  Yes Danford, Earl Lites, MD   methocarbamol (ROBAXIN) 500 MG tablet Take 1 tablet (500 mg total) by mouth every 6 (six) hours as needed for muscle spasms. 01/10/23  Yes Danford, Earl Lites, MD  metroNIDAZOLE (FLAGYL) 500 MG tablet Take 1 tablet (500 mg total) by mouth every 12 (twelve) hours. 02/15/23  Yes Leroy Sea, MD  midodrine (PROAMATINE) 5 MG tablet Take 5 mg by mouth 3 (three) times daily. 02/18/23  Yes [provider]  mirtazapine (REMERON) 15 MG tablet Take 15 mg by mouth at bedtime. 10/18/22  Yes [provider]  Multiple Vitamins-Minerals (SENIOR MULTIVITAMIN PLUS PO) Take 1 tablet by mouth daily.   Yes [provider]  nutrition supplement, JUVEN, (JUVEN) PACK Take 1 packet by mouth 2 (two) times daily between meals. 01/10/23  Yes Danford, Earl Lites, MD  Nutritional Supplements (,FEEDING SUPPLEMENT, PROSOURCE PLUS) liquid Take 30 mLs by mouth 2 (two) times daily between meals. 01/10/23  Yes Danford, Earl Lites, MD  pantoprazole (PROTONIX) 40 MG tablet Take 1 tablet (40 mg total) by mouth 2 (two) times daily. 02/18/23  Yes Raulkar, Drema Pry, MD  torsemide (DEMADEX) 100 MG tablet Take 50 mg by mouth 2 (two) times daily. 02/18/23  Yes [provider]  vancomycin (VANCOCIN) 1-5 GM/200ML-% SOLN Inject 200 mLs (1,000 mg total) into the vein every Monday, Wednesday, and Friday with hemodialysis. Patient taking differently: Inject 1,000 mg into the vein See admin instructions. Inject intravenous every Tuesday Thursday and Saturdays per patient 02/15/23  Yes Susa Raring K, MD  cefTAZidime 0.5 g in dextrose 5 % 50 mL Inject 0.5 g into the vein daily. Patient not taking: Reported on 02/28/2023 02/15/23   Leroy Sea, MD  colchicine 0.6 MG tablet Take 0.5 tablets (0.3 mg total) by mouth 2 (two) times a week. Patient not taking: Reported on 01/29/2023 12/02/22   Azucena Fallen, MD  HYDROmorphone (DILAUDID) 4 MG tablet Take 1 tablet (4 mg total) by mouth See admin  instructions. Give 4 mg (1 tablet) three times daily for pain. Give first dose at 0600 on dialysis days (T-Th-Sat), otherwise give first dose at 0900. May give an additional tablet every 24 hours as needed for severe pain (do not administer within 3 hours of scheduled dose). Patient not taking: Reported on 02/28/2023 02/24/23   Nadara Mustard, MD  metoprolol tartrate (LOPRESSOR) 50 MG tablet Take 1 tablet (50 mg total) by mouth 2 (two) times daily. Patient not taking: Reported on 02/28/2023 01/10/23   Alberteen Sam, MD    Discontinued Meds:   Medications Discontinued During This Encounter  Medication Reason   gabapentin (NEURONTIN) capsule 100 mg    Amino Acids-Protein Hydrolys (FEEDING SUPPLEMENT, PRO-STAT SUGAR FREE 64,) LIQD Patient Preference   Nutritional Supplement LIQD Patient Preference   HYDROmorphone (DILAUDID) tablet 4 mg     Social History:  reports that she has never smoked. She has never used smokeless tobacco. She reports that she does not drink alcohol and does not use drugs.  Family History:   Family History  Problem Relation Age of Onset   Diabetes Mother    Diabetes Father    Heart disease Father    Diabetes Sister    Congestive Heart Failure Sister 42   Diabetes Brother     Blood pressure (!) 146/79, pulse 93, temperature 98 F (36.7 C), temperature source Oral, resp. rate (!) 21, height 5\' 6"  (1.676 m), weight 111.9 kg, last menstrual period 10/10/2012, SpO2 100%. Physical Exam: Gen alert, no distress No rash, cyanosis or gangrene No jvd or bruits Chest clear bilat to bases, no rales/ wheezing RRR no MRG Abd soft ntnd no  mass or ascites +bs MS [name] wounds w/ large openings and drainage Ext no L lower ext edema Neuro is alert, Ox 3 , nf  RIJ TDC in place,. Lt BCF elevated wounds closed good bruit and augments     Dolce Sylvia, Len Blalock, MD 03/01/2023, 7:58 AM

## 2023-03-01 NOTE — Plan of Care (Signed)
  Problem: Metabolic: Goal: Ability to maintain appropriate glucose levels will improve Outcome: Progressing   Problem: Nutritional: Goal: Maintenance of adequate nutrition will improve Outcome: Progressing   Problem: Health Behavior/Discharge Planning: Goal: Ability to manage health-related needs will improve Outcome: Progressing   Problem: Clinical Measurements: Goal: Will remain free from infection Outcome: Progressing   Problem: Coping: Goal: Level of anxiety will decrease Outcome: Progressing

## 2023-03-01 NOTE — Plan of Care (Signed)
  Problem: Education: Goal: Knowledge of General Education information will improve Description: Including pain rating scale, medication(s)/side effects and non-pharmacologic comfort measures Outcome: Progressing   Problem: Clinical Measurements: Goal: Will remain free from infection Outcome: Progressing Goal: Diagnostic test results will improve Outcome: Progressing Goal: Respiratory complications will improve Outcome: Progressing   Problem: Coping: Goal: Level of anxiety will decrease Outcome: Progressing   Problem: Pain Management: Goal: General experience of comfort will improve Outcome: Progressing   Problem: Skin Integrity: Goal: Risk for impaired skin integrity will decrease Outcome: Progressing

## 2023-03-01 NOTE — Consult Note (Signed)
Consultation Note Date: 03/01/2023   Patient Name: Deborah [name]  DOB: 04/06/1965  MRN: [id number]  Age / Sex: 57 y.o., female  PCP: Pcp, No Referring Physician: Osvaldo Shipper, MD  Reason for Consultation: Establishing goals of care  HPI/Patient Profile: 57 y.o. female  with past medical history of hypertension, diabetes, neuropathy, gastroparesis, GERD, anemia, PAF, restrictive lung disease, spinal stenosis, ESRD on HD TTS, gout, duodenal carcinoid tumor, s/p R BKA, gastric AVM, NASH, tardive dyskinesia, depression, anxiety admitted on 02/28/2023 with worsening abdominal pain and diarrhea. .   Clinical Assessment and Goals of Care: Consult received and extensive chart review completed of previous hospital stay notes, labs, diagnostics. Reviewed recent palliative visits. [name] was previously considering stopping dialysis and moving towards comfort but ultimately decided to proceed with full code, full scope. She also in my colleague's note mentioned desire to make [facility] but this was not documented.   I met today with [name]. She is sleeping but awakens but only briefly and appears startled when awakened. She nods head yes to completing dialysis today. I attempted to awaken to engage in conversation but she is unable to stay awake for conversation. Noted that she is behind on HD so hoping after HD she will be able to have a more productive conversation. I will await to engage any family until I am able to have a conversation with [name] herself.   I contacted Dr. Rito Ehrlich and made him aware of my plan. I will follow up tomorrow with hopes to have further conversations with Ms. Hiniker about her wishes.   Primary Decision Maker PATIENT (not currently but hopeful for improvement after HD)    SUMMARY OF RECOMMENDATIONS   - Will follow up tomorrow to discuss goals of  care  Code Status/Advance Care Planning: Full code - will discuss later   Symptom Management:  Per attending, renal  Prognosis:  Overall prognosis poor.   Discharge Planning: To Be Determined      Primary Diagnoses: Present on Admission:  Essential hypertension  Right below-knee amputee (HCC)  Diabetic gastroparesis (HCC)  Depression  Anxiety  Morbid obesity (HCC)  GERD (gastroesophageal reflux disease)  Normocytic anemia  PAF (paroxysmal atrial fibrillation) (HCC)  Chronic pain  (HFpEF) heart failure with preserved ejection fraction (HCC)  Tardive dyskinesia  Leukocytosis  Chronic hypokalemia  Intractable diarrhea   I have reviewed the medical record, interviewed the patient and family, and examined the patient. The following aspects are pertinent.  Past Medical History:  Diagnosis Date   Acute back pain with sciatica, left    Acute back pain with sciatica, right    Acute encephalopathy 05/29/2022   Acute osteomyelitis of right calcaneus (HCC) 12/21/2022   AKI (acute kidney injury) (HCC)    Anemia, unspecified    Atrial fibrillation with RVR (HCC)-resolved 09/09/2022   Atypical chest pain 09/10/2021   Cancer [facility])    Carcinoid tumor of duodenum    Chest pain with normal coronary angiography 2019   Chronic a-fib (  HCC) 09/09/2022   Chronic kidney disease, stage 3b (HCC)    Chronic pain    Chronic systolic CHF (congestive heart failure) (HCC)    Dehiscence of amputation stump of right lower extremity (HCC) 01/29/2023   Diabetes mellitus    DKA (diabetic ketoacidosis) (HCC)    Drug-seeking behavior    21 hospitalizations and 14 CT a/p in 2 years for N/V and abdominal pain, demanding only IV dilaudid   Elevated troponin    chronic   Esophageal reflux    Fibromyalgia    Gastric ulcer    Gastroparesis    Gout    HCAP (healthcare-associated pneumonia) 06/19/2022   Hyperlipidemia    Hyperosmolar hyperglycemic state (HHS) (HCC) 05/11/2022   Hyperosmolar  non-ketotic state due to type 2 diabetes mellitus (HCC) 05/11/2022   Hypertension    Hypokalemia    Hypomagnesemia    Lumbosacral stenosis    LVH (left ventricular hypertrophy)    Morbid obesity (HCC)    Nausea & vomiting 09/09/2022   NICM (nonischemic cardiomyopathy) (HCC)    PAF (paroxysmal atrial fibrillation) (HCC)    Sepsis (HCC) 11/23/2022   Stroke (HCC) 02/2011   Symptomatic anemia 12/14/2022   Thrombocytosis    Vitamin B12 deficiency anemia    Social History   Socioeconomic History   Marital status: Married    Spouse name: Not on file   Number of children: Not on file   Years of education: Not on file   Highest education level: Not on file  Occupational History   Not on file  Tobacco Use   Smoking status: Never   Smokeless tobacco: Never  Vaping Use   Vaping status: Never Used  Substance and Sexual Activity   Alcohol use: No   Drug use: No   Sexual activity: Not Currently    Birth control/protection: None  Other Topics Concern   Not on file  Social History Narrative   ** Merged History Encounter **       Social Drivers of Health   Financial Resource Strain: Low Risk (03/23/2021)   Received from [facility] ([facility])   Financial Resource Strain  Food Insecurity: No Food Insecurity (02/28/2023)   Hunger Vital Sign    Worried About Running Out of Food in the Last Year: Never true    Ran Out of Food in the Last Year: Never true  Recent Concern: Food Insecurity - High Risk (01/25/2023)   Received from [facility]   Hunger Vital Sign    Worried About Running Out of Food in the Last Year: Patient declined to answer    Ran Out of Food in the Last Year: Often true  Transportation Needs: Unmet Transportation Needs (02/28/2023)   PRAPARE - Administrator, Civil Service (Medical): Yes    Lack of Transportation (Non-Medical): Yes  Physical Activity: Not on File (10/31/2017)   Received from [place], [place]   Physical Activity    Physical  Activity: 0  Stress: Low Risk (03/23/2021)   [facility])   Stress    Over the last 2 weeks, how often have you been bothered by the following problems: feeling nervous, anxious, on edge?: Not at all    Over the last 2 weeks, how often have you been bothered by the following problems: Not being able to stop or control worrying?: Not at all  Social Connections: Unknown (07/19/2021)   Received from [facility], [facility]   Social  Network    Social Network: Not on file   Family History  Problem Relation Age of Onset   Diabetes Mother    Diabetes Father    Heart disease Father    Diabetes Sister    Congestive Heart Failure Sister 67   Diabetes Brother    Scheduled Meds:  allopurinol  100 mg Oral BID   atorvastatin  10 mg Oral QPM   busPIRone  5 mg Oral TID   cefadroxil  500 mg Oral Daily   doxycycline  100 mg Oral BID   gabapentin  100 mg Oral TID   Gerhardt's butt cream   Topical TID   heparin  5,000 Units Subcutaneous Q8H   insulin aspart  0-6 Units Subcutaneous TID WC   metoprolol tartrate  50 mg Oral BID   metroNIDAZOLE  500 mg Oral Q12H   mirtazapine  15 mg Oral QHS   pantoprazole  40 mg Oral BID   sodium chloride flush  3 mL Intravenous Q12H   Continuous Infusions:  anticoagulant sodium citrate     PRN Meds:.acetaminophen **OR** acetaminophen, alteplase, anticoagulant sodium citrate, heparin, [COMPLETED]  HYDROmorphone (DILAUDID) injection **FOLLOWED BY** HYDROmorphone (DILAUDID) injection, HYDROmorphone, hydrOXYzine, lidocaine (PF), lidocaine-prilocaine, pentafluoroprop-tetrafluoroeth, polyethylene glycol Allergies  Allergen Reactions   Isovue [Iopamidol] Anaphylaxis, [name] and Other (See Comments)    11/28/17 Patient had seizure like activity and then 1 min code after 100 cc of isovue 300. Possible contrast allergy vs vasovagal episode  Cardiac Arrest   Nsaids Anaphylaxis and Other (See  Comments)    Hx of stomach ulcers   Penicillins Itching, Palpitations and Other (See Comments)    Flushing (Red Skin) Laryngeal Edema   Reglan [Metoclopramide] Other (See Comments)    Tardive dyskinesia    Valium [Diazepam] [name]   Zestril [Lisinopril] Anaphylaxis and Swelling    Tongue and mouth swelling Laryngeal Edema   Tolectin [Tolmetin] Nausea And Vomiting, Nausea Only and Other (See Comments)    Irritates stomach ulcer   Asa [Aspirin] Other (See Comments)    Hx of stomach ulcer   Aspartame And Phenylalanine Hives   Bentyl [Dicyclomine] Other (See Comments)    Chest pain   Hibiclens [Chlorhexidine Gluconate] Other (See Comments)    Dermatitis    Flexeril [Cyclobenzaprine] Palpitations   Oxycontin [Oxycodone] Palpitations   Rifamycins Palpitations   Tylenol [Acetaminophen] Nausea And Vomiting, Nausea Only and Other (See Comments)    Irritates stomach ulcer Abdominal pain   Ultram [Tramadol] Nausea And Vomiting and Palpitations   Review of Systems  Unable to perform ROS: Acuity of condition    Physical Exam Vitals and nursing note reviewed.  Constitutional:      General: She is not in acute distress.    Appearance: She is ill-appearing.  Cardiovascular:     Rate and Rhythm: Normal rate.  Pulmonary:     Effort: No tachypnea, accessory muscle usage or respiratory distress.  Abdominal:     General: Abdomen is flat.  Neurological:     Mental Status: She is alert.     Comments: Too sleepy to converse     Vital Signs: BP 126/76 (BP Location: Right Arm)   Pulse 96   Temp 98.2 F (36.8 C) (Oral)   Resp 20   Ht 5\' 6"  (1.676 m)   Wt 111.9 kg   LMP 10/10/2012   SpO2 100%   BMI 39.82 kg/m  Pain Scale: 0-10   Pain Score: Asleep   SpO2: SpO2:  100 % O2 Device:SpO2: 100 % O2 Flow Rate: .   IO: Intake/output summary:  Intake/Output Summary (Last 24 hours) at 03/01/2023 0954 Last data filed at 03/01/2023 9629 Gross per 24 hour  Intake 3 ml   Output --  Net 3 ml    LBM: Last BM Date : 02/28/23 Baseline Weight: Weight: 113.4 kg Most recent weight: Weight: 111.9 kg     Palliative Assessment/Data:    Time Total: 40 min  Greater than 50%  of this time was spent counseling and coordinating care related to the above assessment and plan.  Signed by: Yong Channel, NP Palliative Medicine Team Pager # [phone] (M-F 8a-5p) Team Phone # [phone] (Nights/Weekends)

## 2023-03-02 DIAGNOSIS — Z515 Encounter for palliative care: Secondary | ICD-10-CM | POA: Diagnosis not present

## 2023-03-02 DIAGNOSIS — G546 Phantom limb syndrome with pain: Secondary | ICD-10-CM

## 2023-03-02 DIAGNOSIS — R197 Diarrhea, unspecified: Secondary | ICD-10-CM | POA: Diagnosis not present

## 2023-03-02 DIAGNOSIS — I480 Paroxysmal atrial fibrillation: Secondary | ICD-10-CM | POA: Diagnosis not present

## 2023-03-02 DIAGNOSIS — Z7189 Other specified counseling: Secondary | ICD-10-CM | POA: Diagnosis not present

## 2023-03-02 DIAGNOSIS — N186 End stage renal disease: Secondary | ICD-10-CM | POA: Diagnosis not present

## 2023-03-02 DIAGNOSIS — D649 Anemia, unspecified: Secondary | ICD-10-CM | POA: Diagnosis not present

## 2023-03-02 LAB — GLUCOSE, CAPILLARY
Glucose-Capillary: 155 mg/dL — ABNORMAL HIGH (ref 70–99)
Glucose-Capillary: 98 mg/dL (ref 70–99)
Glucose-Capillary: 75 mg/dL (ref 70–99)
Glucose-Capillary: 147 mg/dL — ABNORMAL HIGH (ref 70–99)

## 2023-03-02 LAB — COMPREHENSIVE METABOLIC PANEL
ALT: 13 U/L (ref 0–44)
AST: 18 U/L (ref 15–41)
Albumin: 2.1 g/dL — ABNORMAL LOW (ref 3.5–5.0)
Alkaline Phosphatase: 68 U/L (ref 38–126)
Anion gap: 12 (ref 5–15)
BUN: 16 mg/dL (ref 6–20)
CO2: 20 mmol/L — ABNORMAL LOW (ref 22–32)
Calcium: 7.5 mg/dL — ABNORMAL LOW (ref 8.9–10.3)
Chloride: 105 mmol/L (ref 98–111)
Creatinine, Ser: 3.83 mg/dL — ABNORMAL HIGH (ref 0.44–1.00)
GFR, Estimated: 13 mL/min — ABNORMAL LOW (ref >60)
Glucose, Bld: 78 mg/dL (ref 70–99)
Potassium: 2.4 mmol/L — CL (ref 3.5–5.1)
Sodium: 137 mmol/L (ref 135–145)
Total Bilirubin: 0.6 mg/dL (ref <1.2)
Total Protein: 5.9 g/dL — ABNORMAL LOW (ref 6.5–8.1)

## 2023-03-02 LAB — CBC
HCT: 26.5 % — ABNORMAL LOW (ref 36.0–46.0)
Hemoglobin: 8.1 g/dL — ABNORMAL LOW (ref 12.0–15.0)
MCH: 28.8 pg (ref 26.0–34.0)
MCHC: 30.6 g/dL (ref 30.0–36.0)
MCV: 94.3 fL (ref 80.0–100.0)
Platelets: 310 10*3/uL (ref 150–400)
RBC: 2.81 MIL/uL — ABNORMAL LOW (ref 3.87–5.11)
RDW: 15.4 % (ref 11.5–15.5)
WBC: 13.3 10*3/uL — ABNORMAL HIGH (ref 4.0–10.5)
nRBC: 0 % (ref 0.0–0.2)

## 2023-03-02 LAB — HEPATITIS B SURFACE ANTIBODY, QUANTITATIVE: Hep B S AB Quant (Post): 1604 m[IU]/mL

## 2023-03-02 MED ORDER — METOPROLOL TARTRATE 5 MG/5ML IV SOLN: 2.5000 mg | Freq: Four times a day (QID) | INTRAVENOUS | Status: DC | PRN

## 2023-03-02 MED ORDER — METHOCARBAMOL 500 MG PO TABS
500.0000 mg | ORAL_TABLET | Freq: Three times a day (TID) | ORAL | Status: DC | PRN
  Administered 2023-03-03 – 2023-03-08 (×7): 500 mg via ORAL
  Filled 2023-03-02 (×9): qty 1

## 2023-03-02 MED ORDER — POTASSIUM CHLORIDE 10 MEQ/50ML IV SOLN
10.0000 meq | INTRAVENOUS | Status: DC
Start: 2023-03-02 — End: 2023-03-02
  Filled 2023-03-02 (×4): qty 50

## 2023-03-02 MED ORDER — POTASSIUM CHLORIDE 10 MEQ/100ML IV SOLN
10.0000 meq | INTRAVENOUS | Status: AC
  Administered 2023-03-02 (×4): 10 meq via INTRAVENOUS
  Filled 2023-03-02 (×4): qty 100

## 2023-03-02 MED ORDER — POTASSIUM CHLORIDE CRYS ER 20 MEQ PO TBCR
40.0000 meq | EXTENDED_RELEASE_TABLET | Freq: Once | ORAL | Status: DC
  Filled 2023-03-02: qty 2

## 2023-03-02 MED ORDER — LOPERAMIDE HCL 2 MG PO CAPS
4.0000 mg | ORAL_CAPSULE | Freq: Three times a day (TID) | ORAL | Status: AC
  Administered 2023-03-02 – 2023-03-03 (×6): 4 mg via ORAL
  Filled 2023-03-02 (×6): qty 2

## 2023-03-02 MED ORDER — SODIUM CHLORIDE 0.9 % IV BOLUS
500.0000 mL | Freq: Once | INTRAVENOUS | Status: AC
  Administered 2023-03-02: 500 mL via INTRAVENOUS

## 2023-03-02 MED ORDER — SACCHAROMYCES BOULARDII 250 MG PO CAPS
250.0000 mg | ORAL_CAPSULE | Freq: Two times a day (BID) | ORAL | Status: DC
  Administered 2023-03-02 – 2023-03-08 (×13): 250 mg via ORAL
  Filled 2023-03-02 (×14): qty 1

## 2023-03-02 MED ORDER — HYDROMORPHONE HCL 1 MG/ML IJ SOLN
0.5000 mg | Freq: Once | INTRAMUSCULAR | Status: AC
  Administered 2023-03-02: 0.5 mg via INTRAVENOUS
  Filled 2023-03-02: qty 0.5

## 2023-03-02 NOTE — Progress Notes (Signed)
Palliative:  57 y.o. female  with past medical history of hypertension, diabetes, neuropathy, gastroparesis, GERD, anemia, PAF, restrictive lung disease, spinal stenosis, ESRD on HD TTS, gout, duodenal carcinoid tumor, s/p R BKA, gastric AVM, NASH, tardive dyskinesia, depression, anxiety admitted on 02/28/2023 with worsening abdominal pain and diarrhea.   I met today with [name] and her husband, Deborah [name], at bedside. Deborah [name] struggled to stay awake during my visit. [name] is wailing and crying in pain. She describes ongoing episodes of severe pain that is unbearable to live with. I provided her with extra dose of dilaudid to assist with better relief so we can at least have a conversation. She continues to share about her suffering and struggles with knowing what to do. She tells me that she doesn't want to die but she cannot keep living in this pain. I had an honest conversation with her about the severity of her pain and the unlikelihood that we will be able to adequately control her pain while also keeping her hemodynamically stable to continue with dialysis.   I educated that we can continue to work with her pain (she follows with pain clinic so any changes would need to be cleared through them) but I do not anticipate that we will be successful to give her complete relief of her pain. I spoke with her about the options for comfort care if she decides her goal is pain relief and not to proceed with dialysis and interventions to prolong life. I explained that I could start a opioid infusion to get her relief if her goal and decision is for comfort but the consequences would be that she would likely be more sedated and her time would be very limited potentially to just days prognosis. She asks if she could change her mind after electing comfort care and I explained that once that process begins there is unfortunately no going back due to the effect of the medication and side effects. She continues  to vacillate in what she really wants. We discussed code status and she was also unable to make a decision. She does share that she has previously desired to stop dialysis and have hospice but her family did not agree with her decision and felt that this was equivalent to suicide per her report. However, she also reports feeling like a burden to her family. She feels torn. Her husband at bedside does report that he is hoping that things can get better but is she cannot do this anymore he would support any decision she makes. We decided to let her get some rest with the extra dilaudid and to try the hydroxyzine as well. Give her time to think about her choices and decisions.   All questions/concerns addressed. Emotional support provided.   Exam: Alert, oriented. Agitated and restless from pain. Also clearly with existential pain and suffering. Breathing regular, unlabored. Abd soft. Moves all extremities. R BKA.   Plan: - Full code, full scope - Ongoing goals of care discussions - Severe pain:  - Consult to Physical Medicine who have been managing pain outpatient.   - Continue previous dilaudid 4 mg po will do q6h PRN. May give dilaudid 0.5 mg IV q3h PRN breakthrough pain.   - Consider Cymbalta.   - Consider gabapentin/lyrica although dosage limited by dialysis.   65 min  Yong Channel, NP Palliative Medicine  Pager [phone] (Please see [email or web address] for schedule) Team Phone [phone]

## 2023-03-02 NOTE — Progress Notes (Signed)
Deborah [name] is an 57 y.o. female w/ sciatica, carcinoid tumor of the duodenum, HFrEF, DM, gastroparesis, HTN, NICM, PAF, CVA, ESRD dialyzing Tuesday Thursday Saturdays at [facility] with Dr. Signe Colt.  Patient missed dialysis on Saturday because she was not feeling well mainly because of the pain having run out of her opioids.  Patient has a appointment with pain clinic in the next few weeks.  Patient has some mild shortness of breath but the main complaint right now is pain all over especially in the abdomen and right leg.    OP HD: TTS [facility]  4h  400/800  112kg   3K/2.5Ca bath   TDC   Heparin none - last OP HD 12/19 left at 116 kg  - has been usually reaching dry wt - venofer 100mg  q hd thru 12/04 - mircera 100 mcg q 2 wks, last 10/5, due 10/19   Assessment/Plan: ESKD - on HD TTS. Last HD Th 12/19. Labs and vol okay.  Patient tolerated dialysis on Tuesday with 1.8 L net UF; Th Sat per holiday schedule.  HTN - bp's wnl, takes metoprolol at home. Cont meds as needed.  Volume - no gross vol excess on exam she is still short of breath, improved with dialysis low, and we will plan on ultrafiltration tomorrow with dialysis as tolerated. Anemia of eskd - Hb 9.9 MBD ckd - CCa in range. Add on phos. Binders w/ meals.  Afib - not on anticoagulation because of GI bleeding. Chronic pain syndrome on narcotics PAD with rt BKA 10/23 with h/o stump dehiscence.    Subjective: Patient refused dialysis Monday evening but able to be dialyzed with 1.8 L UF on Tuesday.  Patient was tachycardic overnight.  Denies any fever chills nausea vomiting.  Will has mild shortness of breath but she states it has improved with dialysis on Tuesday.  Still having diarrhea approximately 5 bowel movements per day   Chemistry and CBC: Creatinine, Ser  Date/Time Value Ref Range Status  03/01/2023 04:46 AM 5.30 (H) 0.44 - 1.00 mg/dL Final  41/32/4401 02:72 AM 5.80 (H) 0.44 - 1.00 mg/dL Final  53/66/4403 47:42 AM 5.36 (H)  0.44 - 1.00 mg/dL Final  59/56/3875 64:33 AM 5.92 (H) 0.44 - 1.00 mg/dL Final  29/51/8841 66:06 PM 5.83 (H) 0.44 - 1.00 mg/dL Final  30/16/0109 32:35 AM 5.50 (H) 0.44 - 1.00 mg/dL Final  57/32/2025 42:70 AM 4.88 (H) 0.44 - 1.00 mg/dL Final  62/37/6283 15:17 PM 4.81 (H) 0.44 - 1.00 mg/dL Final  61/60/7371 06:26 AM 5.17 (H) 0.44 - 1.00 mg/dL Final  94/85/4627 03:50 AM 3.95 (H) 0.44 - 1.00 mg/dL Final  09/38/1829 93:71 AM 3.92 (H) 0.44 - 1.00 mg/dL Final  69/67/8938 10:17 AM 3.36 (H) 0.44 - 1.00 mg/dL Final  51/04/5850 77:82 AM 4.71 (H) 0.44 - 1.00 mg/dL Final  42/35/3614 43:15 AM 4.09 (H) 0.44 - 1.00 mg/dL Final  40/10/6759 95:09 AM 5.91 (H) 0.44 - 1.00 mg/dL Final  32/67/1245 80:99 PM 5.50 (H) 0.44 - 1.00 mg/dL Final  83/38/2505 39:76 PM 6.45 (H) 0.44 - 1.00 mg/dL Final  73/41/9379 02:40 PM 5.39 (H) 0.44 - 1.00 mg/dL Final  97/35/3299 24:26 PM 4.09 (H) 0.44 - 1.00 mg/dL Final  83/41/9622 29:79 AM 5.07 (H) 0.44 - 1.00 mg/dL Final  89/21/1941 74:08 AM 5.25 (H) 0.44 - 1.00 mg/dL Final  14/48/1856 31:49 AM 4.31 (H) 0.44 - 1.00 mg/dL Final    Comment:    DELTA NOTED. OK TO RELEASE RESULT PER  ROSS,T RN.  01/06/2023 04:31 AM 2.94 (H) 0.44 - 1.00 mg/dL Final  95/28/4132 44:01 AM 4.26 (H) 0.44 - 1.00 mg/dL Final  02/72/5366 44:03 PM 4.72 (H) 0.44 - 1.00 mg/dL Final  47/42/5956 38:75 AM 4.49 (H) 0.44 - 1.00 mg/dL Final  64/33/2951 88:41 PM 4.19 (H) 0.44 - 1.00 mg/dL Final  66/08/3014 01:09 AM 4.20 (H) 0.44 - 1.00 mg/dL Final  32/35/5732 20:25 AM 3.41 (H) 0.44 - 1.00 mg/dL Final  42/70/6237 62:83 PM 4.30 (H) 0.44 - 1.00 mg/dL Final  15/17/6160 73:71 PM 3.48 (H) 0.44 - 1.00 mg/dL Final  09/01/9483 46:27 AM 4.70 (H) 0.44 - 1.00 mg/dL Final  03/50/0938 18:29 AM 4.09 (H) 0.44 - 1.00 mg/dL Final  93/71/6967 89:38 AM 4.19 (H) 0.44 - 1.00 mg/dL Final  12/22/5100 58:52 AM 3.93 (H) 0.44 - 1.00 mg/dL Final  77/82/4235 36:14 AM 5.61 (H) 0.44 - 1.00 mg/dL Final  43/15/4008 67:61 AM 5.58 (H) 0.44 - 1.00  mg/dL Final  95/11/3265 12:45 AM 5.98 (H) 0.44 - 1.00 mg/dL Final  80/99/8338 25:05 AM 6.74 (H) 0.44 - 1.00 mg/dL Final  39/76/7341 93:79 PM 6.87 (H) 0.44 - 1.00 mg/dL Final  02/40/9735 32:99 AM 2.36 (H) 0.44 - 1.00 mg/dL Final  24/26/8341 96:22 PM 3.89 (H) 0.44 - 1.00 mg/dL Final  29/79/8921 19:41 AM 3.81 (H) 0.44 - 1.00 mg/dL Final  74/10/1446 18:56 PM 3.88 (H) 0.44 - 1.00 mg/dL Final  31/49/7026 37:85 AM 3.14 (H) 0.44 - 1.00 mg/dL Final  88/50/2774 12:87 AM 4.06 (H) 0.44 - 1.00 mg/dL Final  86/76/7209 47:09 AM 3.54 (H) 0.44 - 1.00 mg/dL Final  62/83/6629 47:65 AM 5.01 (H) 0.44 - 1.00 mg/dL Final  46/50/3546 56:81 AM 10.14 (H) 0.44 - 1.00 mg/dL Final  27/51/7001 74:94 PM 5.68 (H) 0.44 - 1.00 mg/dL Final  49/67/5916 38:46 PM 3.83 (H) 0.44 - 1.00 mg/dL Final  65/99/3570 17:79 AM 1.99 (H) 0.44 - 1.00 mg/dL Final    Comment:    DIALYSIS   Recent Labs  Lab 02/28/23 1129 02/28/23 1139 03/01/23 0446  NA 141 144 141  K 3.0* 3.0* 2.9*  CL 114* 114* 113*  CO2 13*  --  12*  GLUCOSE 132* 128* 145*  BUN 27* 31* 28*  CREATININE 5.36* 5.80* 5.30*  CALCIUM 7.9*  --  7.9*   Recent Labs  Lab 02/28/23 1129 02/28/23 1139 03/01/23 0446  WBC 17.2*  --  14.9*  NEUTROABS 12.9*  --   --   HGB 9.1* 9.9* 9.7*  HCT 29.8* 29.0* 31.8*  MCV 96.1  --  95.2  PLT 423*  --  346   Liver Function Tests: Recent Labs  Lab 02/28/23 1129 03/01/23 0446  AST 26 21  ALT 14 15  ALKPHOS 73 73  BILITOT 0.6 0.6  PROT 7.0 6.4*  ALBUMIN 2.5* 2.3*   Recent Labs  Lab 02/28/23 1129  LIPASE 40   No results for input(s): "AMMONIA" in the last 168 hours. Cardiac Enzymes: No results for input(s): "CKTOTAL", "CKMB", "CKMBINDEX", "TROPONINI" in the last 168 hours. Iron Studies: No results for input(s): "IRON", "TIBC", "TRANSFERRIN", "FERRITIN" in the last 72 hours. PT/INR: @LABRCNTIP (inr:5)  Xrays/Other Studies: ) Results for orders placed or performed during the hospital encounter of 02/28/23 (from  the past 48 hours)  CBG monitoring, ED     Status: Abnormal   Collection Time: 02/28/23 10:16 AM  Result Value Ref Range   Glucose-Capillary 113 (H) 70 - 99 mg/dL    Comment: Glucose reference  range applies only to samples taken after fasting for at least 8 hours.   Comment 1 Notify RN    Comment 2 Document in Chart   Gastrointestinal Panel by PCR , Stool     Status: None   Collection Time: 02/28/23 11:16 AM   Specimen: Stool  Result Value Ref Range   Campylobacter species NOT DETECTED NOT DETECTED   Plesimonas shigelloides NOT DETECTED NOT DETECTED   Salmonella species NOT DETECTED NOT DETECTED   Yersinia enterocolitica NOT DETECTED NOT DETECTED   Vibrio species NOT DETECTED NOT DETECTED   Vibrio cholerae NOT DETECTED NOT DETECTED   Enteroaggregative E coli (EAEC) NOT DETECTED NOT DETECTED   Enteropathogenic E coli (EPEC) NOT DETECTED NOT DETECTED   Enterotoxigenic E coli (ETEC) NOT DETECTED NOT DETECTED   Shiga like toxin producing E coli (STEC) NOT DETECTED NOT DETECTED   Shigella/Enteroinvasive E coli (EIEC) NOT DETECTED NOT DETECTED   Cryptosporidium NOT DETECTED NOT DETECTED   Cyclospora cayetanensis NOT DETECTED NOT DETECTED   Entamoeba histolytica NOT DETECTED NOT DETECTED   Giardia lamblia NOT DETECTED NOT DETECTED   Adenovirus F40/41 NOT DETECTED NOT DETECTED   Astrovirus NOT DETECTED NOT DETECTED   Norovirus GI/GII NOT DETECTED NOT DETECTED   Rotavirus A NOT DETECTED NOT DETECTED   Sapovirus (I, II, IV, and V) NOT DETECTED NOT DETECTED    Comment: Performed at [facility], [address] [place] [address] [place], [place] [address]  C Difficile Quick Screen w PCR reflex     Status: None   Collection Time: 02/28/23 11:16 AM   Specimen: Stool  Result Value Ref Range   C Diff antigen NEGATIVE NEGATIVE   C Diff toxin NEGATIVE NEGATIVE   C Diff interpretation No C. difficile detected.     Comment: Performed at [facility], [address], [place], [place] [address]   CBC with Differential     Status: Abnormal   Collection Time: 02/28/23 11:29 AM  Result Value Ref Range   WBC 17.2 (H) 4.0 - 10.5 K/uL   RBC 3.10 (L) 3.87 - 5.11 MIL/uL   Hemoglobin 9.1 (L) 12.0 - 15.0 g/dL   HCT 72.5 (L) 36.6 - 44.0 %   MCV 96.1 80.0 - 100.0 fL   MCH 29.4 26.0 - 34.0 pg   MCHC 30.5 30.0 - 36.0 g/dL   RDW 34.7 (H) 42.5 - 95.6 %   Platelets 423 (H) 150 - 400 K/uL   nRBC 0.0 0.0 - 0.2 %   Neutrophils Relative % 75 %   Neutro Abs 12.9 (H) 1.7 - 7.7 K/uL   Lymphocytes Relative 16 %   Lymphs Abs 2.8 0.7 - 4.0 K/uL   Monocytes Relative 5 %   Monocytes Absolute 0.8 0.1 - 1.0 K/uL   Eosinophils Relative 2 %   Eosinophils Absolute 0.4 0.0 - 0.5 K/uL   Basophils Relative 1 %   Basophils Absolute 0.1 0.0 - 0.1 K/uL   Immature Granulocytes 1 %   Abs Immature Granulocytes 0.14 (H) 0.00 - 0.07 K/uL    Comment: Performed at [facility], [address] [place], [place] [address]  Comprehensive metabolic panel     Status: Abnormal   Collection Time: 02/28/23 11:29 AM  Result Value Ref Range   Sodium 141 135 - 145 mmol/L   Potassium 3.0 (L) 3.5 - 5.1 mmol/L   Chloride 114 (H) 98 - 111 mmol/L   CO2 13 (L) 22 - 32 mmol/L   Glucose, Bld  132 (H) 70 - 99 mg/dL    Comment: Glucose reference range applies only to samples taken after fasting for at least 8 hours.   BUN 27 (H) 6 - 20 mg/dL   Creatinine, Ser 9.52 (H) 0.44 - 1.00 mg/dL   Calcium 7.9 (L) 8.9 - 10.3 mg/dL   Total Protein 7.0 6.5 - 8.1 g/dL   Albumin 2.5 (L) 3.5 - 5.0 g/dL   AST 26 15 - 41 U/L   ALT 14 0 - 44 U/L   Alkaline Phosphatase 73 38 - 126 U/L   Total Bilirubin 0.6 <1.2 mg/dL   GFR, Estimated 9 (L) >60 mL/min    Comment: (NOTE) Calculated using the CKD-EPI Creatinine Equation (2021)    Anion gap 14 5 - 15    Comment: Performed at [facility], [address] [place], [place] [address]  Lipase, blood     Status: None   Collection Time: 02/28/23 11:29 AM  Result Value Ref Range   Lipase 40  11 - 51 U/L    Comment: Performed at [facility], [address], [place], [place] [address]  Troponin I (High Sensitivity)     Status: None   Collection Time: 02/28/23 11:29 AM  Result Value Ref Range   Troponin I (High Sensitivity) 15 <18 ng/L    Comment: (NOTE) Elevated high sensitivity troponin I (hsTnI) values and significant  changes across serial measurements may suggest ACS but many other  chronic and acute conditions are known to elevate hsTnI results.  Refer to the "Links" section for chest pain algorithms and additional  guidance. Performed at [facility], [address] [place], [place] [address]   Lactic acid, plasma     Status: Abnormal   Collection Time: 02/28/23 11:29 AM  Result Value Ref Range   Lactic Acid, Venous 2.0 (HH) 0.5 - 1.9 mmol/L    Comment: CRITICAL RESULT CALLED TO, READ BACK BY AND VERIFIED WITH T. Shropshire RN , @1216 , 02/28/23, Dabdee, T. Performed at [facility], [address], [place], [place] [address]   I-stat chem 8, ED (not at [facility], [facility] or [facility])     Status: Abnormal   Collection Time: 02/28/23 11:39 AM  Result Value Ref Range   Sodium 144 135 - 145 mmol/L   Potassium 3.0 (L) 3.5 - 5.1 mmol/L   Chloride 114 (H) 98 - 111 mmol/L   BUN 31 (H) 6 - 20 mg/dL   Creatinine, Ser 3.47 (H) 0.44 - 1.00 mg/dL   Glucose, Bld 425 (H) 70 - 99 mg/dL    Comment: Glucose reference range applies only to samples taken after fasting for at least 8 hours.   Calcium, Ion 0.96 (L) 1.15 - 1.40 mmol/L   TCO2 15 (L) 22 - 32 mmol/L   Hemoglobin 9.9 (L) 12.0 - 15.0 g/dL   HCT 95.6 (L) 38.7 - 56.4 %  CBG monitoring, ED     Status: Abnormal   Collection Time: 02/28/23  5:06 PM  Result Value Ref Range   Glucose-Capillary 165 (H) 70 - 99 mg/dL    Comment: Glucose reference range applies only to samples taken after fasting for at least 8 hours.  Lactic acid, plasma     Status: Abnormal   Collection Time: 02/28/23  6:48 PM  Result Value Ref Range    Lactic Acid, Venous 2.6 (HH) 0.5 - 1.9 mmol/L    Comment: CRITICAL RESULT CALLED TO, READ BACK BY AND VERIFIED  WITH MANISHA L. RN @1925  ON 02/28/23 BY MAB Performed at [facility], [address], [place], [place] [address]   Troponin I (High Sensitivity)     Status: None   Collection Time: 02/28/23  6:48 PM  Result Value Ref Range   Troponin I (High Sensitivity) 13 <18 ng/L    Comment: (NOTE) Elevated high sensitivity troponin I (hsTnI) values and significant  changes across serial measurements may suggest ACS but many other  chronic and acute conditions are known to elevate hsTnI results.  Refer to the "Links" section for chest pain algorithms and additional  guidance. Performed at [facility], [address] [place], [place] [address]   Hepatitis B surface antigen     Status: None   Collection Time: 02/28/23  6:48 PM  Result Value Ref Range   Hepatitis B Surface Ag NON REACTIVE NON REACTIVE    Comment: Performed at [facility], [address], [place], [place] [address]  Glucose, capillary     Status: Abnormal   Collection Time: 02/28/23  9:06 PM  Result Value Ref Range   Glucose-Capillary 129 (H) 70 - 99 mg/dL    Comment: Glucose reference range applies only to samples taken after fasting for at least 8 hours.  Comprehensive metabolic panel     Status: Abnormal   Collection Time: 03/01/23  4:46 AM  Result Value Ref Range   Sodium 141 135 - 145 mmol/L   Potassium 2.9 (L) 3.5 - 5.1 mmol/L   Chloride 113 (H) 98 - 111 mmol/L   CO2 12 (L) 22 - 32 mmol/L   Glucose, Bld 145 (H) 70 - 99 mg/dL    Comment: Glucose reference range applies only to samples taken after fasting for at least 8 hours.   BUN 28 (H) 6 - 20 mg/dL   Creatinine, Ser 4.69 (H) 0.44 - 1.00 mg/dL   Calcium 7.9 (L) 8.9 - 10.3 mg/dL   Total Protein 6.4 (L) 6.5 - 8.1 g/dL   Albumin 2.3 (L) 3.5 - 5.0 g/dL   AST 21 15 - 41 U/L   ALT 15 0 - 44 U/L   Alkaline Phosphatase 73 38 - 126 U/L   Total  Bilirubin 0.6 <1.2 mg/dL   GFR, Estimated 9 (L) >60 mL/min    Comment: (NOTE) Calculated using the CKD-EPI Creatinine Equation (2021)    Anion gap 16 (H) 5 - 15    Comment: Performed at [facility], [address] [place], [place] [address]  CBC     Status: Abnormal   Collection Time: 03/01/23  4:46 AM  Result Value Ref Range   WBC 14.9 (H) 4.0 - 10.5 K/uL   RBC 3.34 (L) 3.87 - 5.11 MIL/uL   Hemoglobin 9.7 (L) 12.0 - 15.0 g/dL   HCT 84.1 (L) 32.4 - 40.1 %   MCV 95.2 80.0 - 100.0 fL   MCH 29.0 26.0 - 34.0 pg   MCHC 30.5 30.0 - 36.0 g/dL   RDW 02.7 (H) 25.3 - 66.4 %   Platelets 346 150 - 400 K/uL   nRBC 0.0 0.0 - 0.2 %    Comment: Performed at [facility], [address], [place], [place] [address]  Glucose, capillary     Status: Abnormal   Collection Time: 03/01/23  8:47 AM  Result Value Ref Range   Glucose-Capillary 119 (H) 70 - 99 mg/dL    Comment: Glucose reference range applies only to samples  taken after fasting for at least 8 hours.  Glucose, capillary     Status: Abnormal   Collection Time: 03/01/23  4:22 PM  Result Value Ref Range   Glucose-Capillary 100 (H) 70 - 99 mg/dL    Comment: Glucose reference range applies only to samples taken after fasting for at least 8 hours.  Glucose, capillary     Status: Abnormal   Collection Time: 03/01/23  9:05 PM  Result Value Ref Range   Glucose-Capillary 121 (H) 70 - 99 mg/dL    Comment: Glucose reference range applies only to samples taken after fasting for at least 8 hours.   Comment 1 Notify RN    Comment 2 Document in Chart    CT ABDOMEN PELVIS WO CONTRAST Result Date: 02/28/2023 CLINICAL DATA:  Right-sided abdominal pain. EXAM: CT ABDOMEN AND PELVIS WITHOUT CONTRAST TECHNIQUE: Multidetector CT imaging of the abdomen and pelvis was performed following the standard protocol without IV contrast. RADIATION DOSE REDUCTION: This exam was performed according to the departmental dose-optimization program which includes  automated exposure control, adjustment of the mA and/or kV according to patient size and/or use of iterative reconstruction technique. COMPARISON:  02/17/2023 and earlier studies dating to 01/11/2020 FINDINGS: Lower chest: No acute findings. Hepatobiliary:  No mass visualized on this unenhanced exam. Pancreas: No mass or inflammatory process visualized on this unenhanced exam. Spleen:  Within normal limits in size. Adrenals/Urinary tract: No evidence of urolithiasis or hydronephrosis. Unremarkable unopacified urinary bladder. Stomach/Bowel: Image degradation by motion artifact noted in the lower abdomen and pelvis. No evidence of obstruction, inflammatory process, or abnormal fluid collections. Vascular/Lymphatic: No pathologically enlarged lymph nodes identified. No evidence of abdominal aortic aneurysm. Reproductive: Stable 4.7 cm mass contiguous with the left uterine fundus, consistent with a pedunculated fibroid. No evidence of inflammatory changes or ascites. Other:  None. Musculoskeletal:  No suspicious bone lesions identified. IMPRESSION: No acute findings. Stable pedunculated uterine fibroid. Electronically Signed   By: Danae Orleans M.D.   On: 02/28/2023 13:48   DG Chest Portable 1 View Result Date: 02/28/2023 CLINICAL DATA:  Chest pain. EXAM: PORTABLE CHEST 1 VIEW COMPARISON:  January 31, 2023. FINDINGS: Stable cardiomediastinal silhouette. Right internal jugular dialysis catheter is unchanged. Both lungs are clear. The visualized skeletal structures are unremarkable. IMPRESSION: No active disease. Electronically Signed   By: Lupita Raider M.D.   On: 02/28/2023 12:32   DG Knee Right Port Result Date: 02/28/2023 CLINICAL DATA:  Amputation assessment. EXAM: PORTABLE RIGHT KNEE - 1-2 VIEW COMPARISON:  May 12, 2022. FINDINGS: Status post right below-knee amputation. Surgical staples are seen in the stomach. Stable degenerative changes seen involving the medial and lateral joint spaces. Stable  osteochondroma arising medially from proximal tibia. IMPRESSION: Status post right below-knee amputation. Stable degenerative changes of right knee joint as well as osteo chondroma arising from proximal tibia. Electronically Signed   By: Lupita Raider M.D.   On: 02/28/2023 12:31    PMH:   Past Medical History:  Diagnosis Date   Acute back pain with sciatica, left    Acute back pain with sciatica, right    Acute encephalopathy 05/29/2022   Acute osteomyelitis of right calcaneus (HCC) 12/21/2022   AKI (acute kidney injury) (HCC)    Anemia, unspecified    Atrial fibrillation with RVR (HCC)-resolved 09/09/2022   Atypical chest pain 09/10/2021   [facility])    Carcinoid tumor of duodenum    Chest pain with normal coronary angiography 2019   Chronic a-fib (  HCC) 09/09/2022   Chronic kidney disease, stage 3b (HCC)    Chronic pain    Chronic systolic CHF (congestive heart failure) (HCC)    Dehiscence of amputation stump of right lower extremity (HCC) 01/29/2023   Diabetes mellitus    DKA (diabetic ketoacidosis) (HCC)    Drug-seeking behavior    21 hospitalizations and 14 CT a/p in 2 years for N/V and abdominal pain, demanding only IV dilaudid   Elevated troponin    chronic   Esophageal reflux    Fibromyalgia    Gastric ulcer    Gastroparesis    Gout    HCAP (healthcare-associated pneumonia) 06/19/2022   Hyperlipidemia    Hyperosmolar hyperglycemic state (HHS) (HCC) 05/11/2022   Hyperosmolar non-ketotic state due to type 2 diabetes mellitus (HCC) 05/11/2022   Hypertension    Hypokalemia    Hypomagnesemia    Lumbosacral stenosis    LVH (left ventricular hypertrophy)    Morbid obesity (HCC)    Nausea & vomiting 09/09/2022   NICM (nonischemic cardiomyopathy) (HCC)    PAF (paroxysmal atrial fibrillation) (HCC)    Sepsis (HCC) 11/23/2022   Stroke (HCC) 02/2011   Symptomatic anemia 12/14/2022   Thrombocytosis    Vitamin B12 deficiency anemia     PSH:   Past Surgical History:   Procedure Laterality Date   ABDOMINAL AORTOGRAM W/LOWER EXTREMITY N/A 11/29/2022   Procedure: ABDOMINAL AORTOGRAM W/LOWER EXTREMITY;  Surgeon: Daria Pastures, MD;  Location: [facility];  Service: Cardiovascular;  Laterality: N/A;   AMPUTATION Right 12/29/2022   Procedure: RIGHT BELOW KNEE AMPUTATION;  Surgeon: Nadara Mustard, MD;  Location: [facility] OR;  Service: Orthopedics;  Laterality: Right;   AV FISTULA PLACEMENT Left 06/30/2022   Procedure: LEFT BRACHIOCEPHALIC ARTERIOVENOUS (AV) FISTULA CREATION;  Surgeon: Cephus Shelling, MD;  Location: [facility] OR;  Service: Vascular;  Laterality: Left;   BIOPSY  07/27/2019   Procedure: BIOPSY;  Surgeon: Vida Rigger, MD;  Location: [facility]OSCOPY;  Service: Endoscopy;;   BIOPSY  07/30/2019   Procedure: BIOPSY;  Surgeon: Kathi Der, MD;  Location: [facility];  Service: Gastroenterology;;   CATARACT EXTRACTION  01/2014   CHOLECYSTECTOMY     COLONOSCOPY WITH PROPOFOL N/A 07/30/2019   Procedure: COLONOSCOPY WITH PROPOFOL;  Surgeon: Kathi Der, MD;  Location: [facility];  Service: Gastroenterology;  Laterality: N/A;   ESOPHAGOGASTRODUODENOSCOPY N/A 07/27/2019   Procedure: ESOPHAGOGASTRODUODENOSCOPY (EGD);  Surgeon: Vida Rigger, MD;  Location: [facility];  Service: Endoscopy;  Laterality: N/A;   ESOPHAGOGASTRODUODENOSCOPY N/A 07/26/2020   Procedure: ESOPHAGOGASTRODUODENOSCOPY (EGD);  Surgeon: Willis Modena, MD;  Location: [facility];  Service: Endoscopy;  Laterality: N/A;   ESOPHAGOGASTRODUODENOSCOPY (EGD) WITH PROPOFOL N/A 08/02/2019   Procedure: ESOPHAGOGASTRODUODENOSCOPY (EGD) WITH PROPOFOL;  Surgeon: Kathi Der, MD;  Location: [facility];  Service: Gastroenterology;  Laterality: N/A;   ESOPHAGOGASTRODUODENOSCOPY (EGD) WITH PROPOFOL N/A 12/23/2022   Procedure: ESOPHAGOGASTRODUODENOSCOPY (EGD) WITH PROPOFOL;  Surgeon: Shellia Cleverly, DO;  Location: [facility];  Service: Gastroenterology;  Laterality: N/A;   FISTULA  SUPERFICIALIZATION Left 12/31/2022   Procedure: LEFT ARM FISTULA TRANSPOSITION;  Surgeon: Maeola Harman, MD;  Location: [facility] OR;  Service: Vascular;  Laterality: Left;   GIVENS CAPSULE STUDY N/A 12/23/2022   Procedure: GIVENS CAPSULE STUDY;  Surgeon: Shellia Cleverly, DO;  Location: [facility];  Service: Gastroenterology;  Laterality: N/A;   HEMOSTASIS CLIP PLACEMENT  08/02/2019   Procedure: HEMOSTASIS CLIP PLACEMENT;  Surgeon: Kathi Der, MD;  Location: [facility];  Service: Gastroenterology;;   HOT HEMOSTASIS  N/A 12/23/2022   Procedure: HOT HEMOSTASIS (ARGON PLASMA COAGULATION/BICAP);  Surgeon: Shellia Cleverly, DO;  Location: [facility] ENDOSCOPY;  Service: Gastroenterology;  Laterality: N/A;   IR FLUORO GUIDE CV LINE RIGHT  06/24/2022   IR US GUIDE VASC ACCESS RIGHT  06/24/2022   POLYPECTOMY  07/30/2019   Procedure: POLYPECTOMY;  Surgeon: Kathi Der, MD;  Location: [facility];  Service: Gastroenterology;;   POLYPECTOMY  08/02/2019   Procedure: POLYPECTOMY;  Surgeon: Kathi Der, MD;  Location: [facility];  Service: Gastroenterology;;   STUMP REVISION Right 02/02/2023   Procedure: REVISION RIGHT BELOW KNEE AMPUTATION;  Surgeon: Nadara Mustard, MD;  Location: [facility] OR;  Service: Orthopedics;  Laterality: Right;    Allergies:  Allergies  Allergen Reactions   Isovue [Iopamidol] Anaphylaxis, [name] and Other (See Comments)    11/28/17 Patient had seizure like activity and then 1 min code after 100 cc of isovue 300. Possible contrast allergy vs vasovagal episode  Cardiac Arrest   Nsaids Anaphylaxis and Other (See Comments)    Hx of stomach ulcers   Penicillins Itching, Palpitations and Other (See Comments)    Flushing (Red Skin) Laryngeal Edema   Reglan [Metoclopramide] Other (See Comments)    Tardive dyskinesia    Valium [Diazepam] [name]   Zestril [Lisinopril] Anaphylaxis and Swelling    Tongue and mouth swelling Laryngeal Edema    Tolectin [Tolmetin] Nausea And Vomiting, Nausea Only and Other (See Comments)    Irritates stomach ulcer   Asa [Aspirin] Other (See Comments)    Hx of stomach ulcer   Aspartame And Phenylalanine Hives   Bentyl [Dicyclomine] Other (See Comments)    Chest pain   Hibiclens [Chlorhexidine Gluconate] Other (See Comments)    Dermatitis    Flexeril [Cyclobenzaprine] Palpitations   Oxycontin [Oxycodone] Palpitations   Rifamycins Palpitations   Tylenol [Acetaminophen] Nausea And Vomiting, Nausea Only and Other (See Comments)    Irritates stomach ulcer Abdominal pain   Ultram [Tramadol] Nausea And Vomiting and Palpitations    Medications:   Prior to Admission medications   Medication Sig Start Date End Date Taking? Authorizing Provider  albuterol (PROVENTIL) (2.5 MG/3ML) 0.083% nebulizer solution Take 3 mLs (2.5 mg total) by nebulization every 6 (six) hours as needed for wheezing or shortness of breath. 04/06/19  Yes Bing Neighbors, NP  allopurinol (ZYLOPRIM) 100 MG tablet Take 1 tablet (100 mg total) by mouth 2 (two) times daily. 11/30/22  Yes Azucena Fallen, MD  ascorbic acid (VITAMIN C) 500 MG tablet Take 500 mg by mouth daily.   Yes [provider]  atorvastatin (LIPITOR) 10 MG tablet Take 10 mg by mouth every evening. 12/03/21  Yes [provider]  busPIRone (BUSPAR) 5 MG tablet Take 5 mg by mouth 3 (three) times daily.   Yes [provider]  carvedilol (COREG) 12.5 MG tablet Take 12.5 mg by mouth 2 (two) times daily with a meal. 02/18/23  Yes [provider]  cefadroxil (DURICEF) 500 MG capsule Take 1 capsule (500 mg total) by mouth daily for 14 days. 02/23/23  Yes Leroy Sea, MD  cetirizine (ZYRTEC) 10 MG tablet Take 10 mg by mouth daily. 08/05/22  Yes [provider]  doxycycline (VIBRA-TABS) 100 MG tablet Take 1 tablet (100 mg total) by mouth 2 (two) times daily. 02/23/23  Yes Leroy Sea, MD  ELIQUIS 5 MG TABS tablet  Take 5 mg by mouth 2 (two) times daily. 02/18/23  Yes [provider]  famotidine (PEPCID) 20 MG tablet Take 20 mg by mouth daily before breakfast.   Yes [provider]  ferric citrate (AURYXIA) 1 GM 210 MG(Fe) tablet Take 2 tablets (420 mg total) by mouth 3 (three) times daily with meals. Patient taking differently: Take 210 mg by mouth 3 (three) times daily with meals. 11/30/22  Yes Azucena Fallen, MD  fluticasone [facility]) 50 MCG/ACT nasal spray Place 2 sprays into both nostrils daily as needed for allergies or rhinitis. 12/19/18  Yes Rai, Ripudeep K, MD  folic acid (FOLVITE) 1 MG tablet Take 1 tablet (1 mg total) by mouth daily. 01/10/23  Yes Danford, Earl Lites, MD  gabapentin (NEURONTIN) 100 MG capsule Take 1 capsule (100 mg total) by mouth 3 (three) times daily. 11/30/22  Yes Azucena Fallen, MD  hydrALAZINE (APRESOLINE) 50 MG tablet Take 50 mg by mouth 3 (three) times daily. 02/18/23  Yes [provider]  hydrOXYzine (ATARAX) 25 MG tablet Take 1 tablet (25 mg total) by mouth 3 (three) times daily as needed for anxiety. Patient taking differently: Take 25 mg by mouth See admin instructions. Give 25 mg (1 tablet) every 6 hours as needed for anxiety, agitation for 14 days (starting 11/19), then every 8 hours as needed. 01/10/23  Yes Danford, Earl Lites, MD  insulin glargine (SEMGLEE, YFGN,) 100 UNIT/ML Solostar Pen Inject 8 Units into the skin at bedtime.   Yes [provider]  insulin lispro (HUMALOG) 100 UNIT/ML KwikPen Before each meal 3 times a day, 140-199 - 2 units, 200-250 - 6 units, 251-299 - 8 units,  300-349 - 12 units,  350 or above 14 units. Patient taking differently: Inject 0-14 Units into the skin See admin instructions. Inject 0-14 units per sliding scale before meals: < 70 notify MD 70-139 : 0 units 140-199 : 2 units 200-250 : 6 units 251-299 : 8 units 300-349 : 12 units 350-400 : 14 units > 401 notify MD. 06/04/22  Yes Leroy Sea, MD  isosorbide mononitrate (IMDUR) 60 MG 24 hr tablet Take 60 mg by mouth daily. 02/18/23  Yes [provider]  loperamide (IMODIUM) 2 MG capsule Take 1 capsule (2 mg total) by mouth as needed for diarrhea or loose stools. Patient taking differently: Take 2 mg by mouth daily as needed for diarrhea or loose stools. 01/10/23  Yes Danford, Earl Lites, MD  methocarbamol (ROBAXIN) 500 MG tablet Take 1 tablet (500 mg total) by mouth every 6 (six) hours as needed for muscle spasms. 01/10/23  Yes Danford, Earl Lites, MD  metroNIDAZOLE (FLAGYL) 500 MG tablet Take 1 tablet (500 mg total) by mouth every 12 (twelve) hours. 02/15/23  Yes Leroy Sea, MD  midodrine (PROAMATINE) 5 MG tablet Take 5 mg by mouth 3 (three) times daily. 02/18/23  Yes [provider]  mirtazapine (REMERON) 15 MG tablet Take 15 mg by mouth at bedtime. 10/18/22  Yes [provider]  Multiple Vitamins-Minerals (SENIOR MULTIVITAMIN PLUS PO) Take 1 tablet by mouth daily.   Yes [provider]  nutrition supplement, JUVEN, (JUVEN) PACK Take 1 packet by mouth 2 (two) times daily between meals. 01/10/23  Yes Danford, Earl Lites, MD  Nutritional Supplements (,FEEDING SUPPLEMENT, PROSOURCE PLUS) liquid Take 30 mLs by mouth 2 (two) times daily between meals. 01/10/23  Yes Danford, Earl Lites, MD  pantoprazole (PROTONIX) 40 MG tablet Take 1 tablet (40 mg total) by mouth 2 (two) times daily. 02/18/23  Yes Raulkar, Drema Pry, MD  torsemide [facility])  100 MG tablet Take 50 mg by mouth 2 (two) times daily. 02/18/23  Yes [provider]  vancomycin (VANCOCIN) 1-5 GM/200ML-% SOLN Inject 200 mLs (1,000 mg total) into the vein every Monday, Wednesday, and Friday with hemodialysis. Patient taking differently: Inject 1,000 mg into the vein See admin instructions. Inject intravenous every Tuesday Thursday and Saturdays per patient 02/15/23  Yes Susa Raring K, MD  cefTAZidime 0.5 g in  dextrose 5 % 50 mL Inject 0.5 g into the vein daily. Patient not taking: Reported on 02/28/2023 02/15/23   Leroy Sea, MD  colchicine 0.6 MG tablet Take 0.5 tablets (0.3 mg total) by mouth 2 (two) times a week. Patient not taking: Reported on 01/29/2023 12/02/22   Azucena Fallen, MD  HYDROmorphone (DILAUDID) 4 MG tablet Take 1 tablet (4 mg total) by mouth See admin instructions. Give 4 mg (1 tablet) three times daily for pain. Give first dose at 0600 on dialysis days (T-Th-Sat), otherwise give first dose at 0900. May give an additional tablet every 24 hours as needed for severe pain (do not administer within 3 hours of scheduled dose). Patient not taking: Reported on 02/28/2023 02/24/23   Nadara Mustard, MD  metoprolol tartrate (LOPRESSOR) 50 MG tablet Take 1 tablet (50 mg total) by mouth 2 (two) times daily. Patient not taking: Reported on 02/28/2023 01/10/23   Alberteen Sam, MD    Discontinued Meds:   Medications Discontinued During This Encounter  Medication Reason   gabapentin (NEURONTIN) capsule 100 mg    Amino Acids-Protein Hydrolys (FEEDING SUPPLEMENT, PRO-STAT SUGAR FREE 64,) LIQD Patient Preference   Nutritional Supplement LIQD Patient Preference   HYDROmorphone (DILAUDID) tablet 4 mg    pentafluoroprop-tetrafluoroeth (GEBAUERS) aerosol 1 Application Patient Transfer   lidocaine (PF) (XYLOCAINE) 1 % injection 5 mL Patient Transfer   lidocaine-prilocaine (EMLA) cream 1 Application Patient Transfer   heparin injection 1,000 Units Patient Transfer   anticoagulant sodium citrate solution 5 mL Patient Transfer   alteplase (CATHFLO ACTIVASE) injection 2 mg Patient Transfer   gabapentin (NEURONTIN) capsule 100 mg    busPIRone (BUSPAR) tablet 5 mg     Social History:  reports that she has never smoked. She has never used smokeless tobacco. She reports that she does not drink alcohol and does not use drugs.  Family History:   Family History  Problem Relation Age of  Onset   Diabetes Mother    Diabetes Father    Heart disease Father    Diabetes Sister    Congestive Heart Failure Sister 65   Diabetes Brother     Blood pressure 98/76, pulse (!) 105, temperature 97.6 F (36.4 C), temperature source Oral, resp. rate 16, height 5\' 6"  (1.676 m), weight 109.2 kg, last menstrual period 10/10/2012, SpO2 100%. Physical Exam: Gen alert, no distress No rash, cyanosis or gangrene No jvd or bruits Chest clear bilat to bases, no rales/ wheezing RRR no MRG Abd soft ntnd no mass or ascites +bs MS [name] wound still with staples , tender but no fluctuance Ext no L lower ext edema Neuro is alert, Ox 3 , nf  RIJ TDC in place,. Lt BCF elevated wounds closed good bruit and augments     Tyquasia Pant, Len Blalock, MD 03/02/2023, 7:11 AM

## 2023-03-02 NOTE — Plan of Care (Signed)
  Problem: Education: Goal: Ability to describe self-care measures that may prevent or decrease complications (Diabetes Survival Skills Education) will improve Outcome: Progressing   Problem: Coping: Goal: Ability to adjust to condition or change in health will improve Outcome: Progressing   Problem: Fluid Volume: Goal: Ability to maintain a balanced intake and output will improve Outcome: Progressing   Problem: Health Behavior/Discharge Planning: Goal: Ability to manage health-related needs will improve Outcome: Progressing   Problem: Nutritional: Goal: Maintenance of adequate nutrition will improve Outcome: Progressing   Problem: Skin Integrity: Goal: Risk for impaired skin integrity will decrease Outcome: Progressing   Problem: Education: Goal: Knowledge of General Education information will improve Description: Including pain rating scale, medication(s)/side effects and non-pharmacologic comfort measures Outcome: Progressing   Problem: Clinical Measurements: Goal: Will remain free from infection Outcome: Progressing Goal: Diagnostic test results will improve Outcome: Progressing   Problem: Activity: Goal: Risk for activity intolerance will decrease Outcome: Progressing   Problem: Pain Management: Goal: General experience of comfort will improve Outcome: Progressing   Problem: Safety: Goal: Ability to remain free from injury will improve Outcome: Progressing

## 2023-03-02 NOTE — Plan of Care (Signed)

## 2023-03-02 NOTE — Progress Notes (Signed)
[facility] PROGRESS NOTE   Deborah [name] ZOX:[id number] DOB: 07-22-1965 DOA: 02/28/2023  PCP: Pcp, No  Brief History: 57 y.o. female with medical history significant for hypertension, diabetes, neuropathy, gastroparesis, GERD, anemia, paroxysmal atrial fibrillation, ESRD on HD, CHF, gout, duodenal carcinoid tumor, status post BKA on the right, history of gastric AVM, NASH, tardive dyskinesia, depression, anxiety, obesity, restrictive lung disease, spinal stenosis, chronic pain presenting with abdominal pain and diarrhea.  She has had history of same in the past and has undergone outpatient workup.  Followed by gastroenterology.  Apparently she was under consideration for somatostatin injections considering her history of carcinoid tumor.  She was hospitalized for further management.  CT scan did not show any acute findings.  Consultants: None  Procedures: None    Subjective/Interval History: Patient continues to have diarrhea.  Last episode was about 3 hours ago.  Some nausea but no vomiting.  Last vomiting was last night.  Continues to have abdominal pain.  She points to her lower abdomen towards the right.    Assessment/Plan:  Intractable diarrhea/history of carcinoid tumor/abdominal pain C. difficile was negative.  GI pathogen panel is negative. Patient has had a longstanding history of diarrhea and was under consideration for somatostatin.  Unclear which GI practice she was following with.  She mentions that she has not seen any gastroenterologist here in town in the outpatient setting.  Has been seen by gastroenterology while she has been in the hospital for GI bleed.  The last gastroenterologist she was following with on a regular basis was in [place] which was about 2 years ago. Her last colonoscopy was in 2021 which showed polyps and internal hemorrhoids. Underwent capsule endoscopy in October which did not show any active bleeding. Underwent upper endoscopy  in October which showed esophagitis, gastritis and gastric angiectasia's. CT abdomen pelvis did not show any acute findings. Even though it has not been charted nursing staff does confirm that patient has had multiple episodes of loose stools overnight.  About 3-4 over the course of the night last night.  Since infectious etiology has been ruled out we will give her scheduled Imodium.  If there is no response to this then we will involve gastroenterology and consider somatostatin.  Give her probiotics as well.  End-stage renal disease on hemodialysis/electrolyte abnormalities She is dialyzed on Tuesday Thursday Saturday schedule.  Nephrology is following.  Essential hypertension Home medications.  Paroxysmal atrial fibrillation Continue metoprolol.  No longer on Eliquis due to GI bleeding in 2024 October. Heart rate noted to be elevated this morning.  Hopefully will improved after she gets her metoprolol.  Normocytic anemia Likely multifactorial.  No evidence for overt bleeding.  Chronic diastolic CHF Volume being managed with hemodialysis.  Diabetes mellitus in the setting of end-stage renal disease Monitor CBGs.  Status post right BKA/wound infection Was on IV antibiotics recently.  Now on oral antibiotics.  Noted to be on cefadroxil and doxycycline and Flagyl.  History of depression and anxiety Continue home medications.  History of chronic pain syndrome/spinal stenosis Continue home medications.  Goals of care Palliative care following.  Obesity Estimated body mass index is 38.86 kg/m as calculated from the following:   Height as of this encounter: 5\' 6"  (1.676 m).   Weight as of this encounter: 109.2 kg.   DVT Prophylaxis: Subcutaneous heparin Code Status: Full code Family Communication: Discussed with patient Disposition Plan: To be determined      Medications: Scheduled:  allopurinol  100 mg Oral  BID   atorvastatin  10 mg Oral QPM   cefadroxil  500 mg Oral  Daily   doxycycline  100 mg Oral BID   Gerhardt's butt cream   Topical TID   heparin  5,000 Units Subcutaneous Q8H   insulin aspart  0-6 Units Subcutaneous TID WC   metoprolol tartrate  50 mg Oral BID   metroNIDAZOLE  500 mg Oral Q12H   mirtazapine  15 mg Oral QHS   pantoprazole  40 mg Oral BID   sodium chloride flush  3 mL Intravenous Q12H   Continuous:   [id number] **OR** acetaminophen, [COMPLETED]  HYDROmorphone (DILAUDID) injection **FOLLOWED BY** HYDROmorphone (DILAUDID) injection, HYDROmorphone, hydrOXYzine, polyethylene glycol  Antibiotics: Anti-infectives (From admission, onward)    Start     Dose/Rate Route Frequency Ordered Stop   03/01/23 1000  cefadroxil (DURICEF) capsule 500 mg        500 mg Oral Daily 02/28/23 1533     02/28/23 2200  doxycycline (VIBRA-TABS) tablet 100 mg        100 mg Oral 2 times daily 02/28/23 1533     02/28/23 2200  metroNIDAZOLE (FLAGYL) tablet 500 mg        500 mg Oral Every 12 hours 02/28/23 1533         Objective:  Vital Signs  Vitals:   03/01/23 1954 03/01/23 2102 03/02/23 0328 03/02/23 0855  BP: 95/63 109/82 98/76 99/60   Pulse: (!) 120 (!) 105  92  Resp:  20 16 16   Temp:   97.6 F (36.4 C) 98.5 F (36.9 C)  TempSrc: Oral  Oral Oral  SpO2: 98% 100% 100% 91%  Weight:      Height:        Intake/Output Summary (Last 24 hours) at 03/02/2023 0914 Last data filed at 03/01/2023 0936 Gross per 24 hour  Intake 3 ml  Output --  Net 3 ml   Filed Weights   02/28/23 1039 03/01/23 0451 03/01/23 1133  Weight: 113.4 kg 111.9 kg 109.2 kg    General appearance: Awake alert.  In no distress Resp: Clear to auscultation bilaterally.  Normal effort Cardio: S1-S2 is normal regular.  No S3-S4.  No rubs murmurs or bruit GI: Abdomen is soft.  Non specific tenderness diffusely without any rebound rigidity or guarding.  No masses organomegaly. Right AKA noted.   Lab Results:  Data Reviewed: I have personally reviewed following  labs and reports of the imaging studies  CBC: Recent Labs  Lab 02/28/23 1129 02/28/23 1139 03/01/23 0446 03/02/23 0409  WBC 17.2*  --  14.9* 13.3*  NEUTROABS 12.9*  --   --   --   HGB 9.1* 9.9* 9.7* 8.1*  HCT 29.8* 29.0* 31.8* 26.5*  MCV 96.1  --  95.2 94.3  PLT 423*  --  346 310    Basic Metabolic Panel: Recent Labs  Lab 02/28/23 1129 02/28/23 1139 03/01/23 0446 03/02/23 0409  NA 141 144 141 137  K 3.0* 3.0* 2.9* 2.4*  CL 114* 114* 113* 105  CO2 13*  --  12* 20*  GLUCOSE 132* 128* 145* 78  BUN 27* 31* 28* 16  CREATININE 5.36* 5.80* 5.30* 3.83*  CALCIUM 7.9*  --  7.9* 7.5*    GFR: Estimated Creatinine Clearance: 20 mL/min (A) (by C-G formula based on SCr of 3.83 mg/dL (H)).  Liver Function Tests: Recent Labs  Lab 02/28/23 1129 03/01/23 0446 03/02/23 0409  AST 26 21 18   ALT 14 15 13   ALKPHOS 73  73 68  BILITOT 0.6 0.6 0.6  PROT 7.0 6.4* 5.9*  ALBUMIN 2.5* 2.3* 2.1*    Recent Labs  Lab 02/28/23 1129  LIPASE 40     CBG: Recent Labs  Lab 02/28/23 1706 02/28/23 2106 03/01/23 0847 03/01/23 1622 03/01/23 2105  GLUCAP 165* 129* 119* 100* 121*     Recent Results (from the past 240 hours)  Gastrointestinal Panel by PCR , Stool     Status: None   Collection Time: 02/28/23 11:16 AM   Specimen: Stool  Result Value Ref Range Status   Campylobacter species NOT DETECTED NOT DETECTED Final   Plesimonas shigelloides NOT DETECTED NOT DETECTED Final   Salmonella species NOT DETECTED NOT DETECTED Final   Yersinia enterocolitica NOT DETECTED NOT DETECTED Final   Vibrio species NOT DETECTED NOT DETECTED Final   Vibrio cholerae NOT DETECTED NOT DETECTED Final   Enteroaggregative E coli (EAEC) NOT DETECTED NOT DETECTED Final   Enteropathogenic E coli (EPEC) NOT DETECTED NOT DETECTED Final   Enterotoxigenic E coli (ETEC) NOT DETECTED NOT DETECTED Final   Shiga like toxin producing E coli (STEC) NOT DETECTED NOT DETECTED Final   Shigella/Enteroinvasive E coli  (EIEC) NOT DETECTED NOT DETECTED Final   Cryptosporidium NOT DETECTED NOT DETECTED Final   Cyclospora cayetanensis NOT DETECTED NOT DETECTED Final   Entamoeba histolytica NOT DETECTED NOT DETECTED Final   Giardia lamblia NOT DETECTED NOT DETECTED Final   Adenovirus F40/41 NOT DETECTED NOT DETECTED Final   Astrovirus NOT DETECTED NOT DETECTED Final   Norovirus GI/GII NOT DETECTED NOT DETECTED Final   Rotavirus A NOT DETECTED NOT DETECTED Final   Sapovirus (I, II, IV, and V) NOT DETECTED NOT DETECTED Final    Comment: Performed at [facility], [address] [place], [place] [address]  C Difficile Quick Screen w PCR reflex     Status: None   Collection Time: 02/28/23 11:16 AM   Specimen: Stool  Result Value Ref Range Status   C Diff antigen NEGATIVE NEGATIVE Final   C Diff toxin NEGATIVE NEGATIVE Final   C Diff interpretation No C. difficile detected.  Final    Comment: Performed at [facility], [address], [place], [place] [address]      Radiology Studies: CT ABDOMEN PELVIS WO CONTRAST Result Date: 02/28/2023 CLINICAL DATA:  Right-sided abdominal pain. EXAM: CT ABDOMEN AND PELVIS WITHOUT CONTRAST TECHNIQUE: Multidetector CT imaging of the abdomen and pelvis was performed following the standard protocol without IV contrast. RADIATION DOSE REDUCTION: This exam was performed according to the departmental dose-optimization program which includes automated exposure control, adjustment of the mA and/or kV according to patient size and/or use of iterative reconstruction technique. COMPARISON:  02/17/2023 and earlier studies dating to 01/11/2020 FINDINGS: Lower chest: No acute findings. Hepatobiliary:  No mass visualized on this unenhanced exam. Pancreas: No mass or inflammatory process visualized on this unenhanced exam. Spleen:  Within normal limits in size. Adrenals/Urinary tract: No evidence of urolithiasis or hydronephrosis. Unremarkable unopacified urinary bladder.  Stomach/Bowel: Image degradation by motion artifact noted in the lower abdomen and pelvis. No evidence of obstruction, inflammatory process, or abnormal fluid collections. Vascular/Lymphatic: No pathologically enlarged lymph nodes identified. No evidence of abdominal aortic aneurysm. Reproductive: Stable 4.7 cm mass contiguous with the left uterine fundus, consistent with a pedunculated fibroid. No evidence of inflammatory changes or ascites. Other:  None. Musculoskeletal:  No suspicious bone lesions identified. IMPRESSION: No acute findings. Stable pedunculated uterine fibroid. Electronically Signed   By:  Danae Orleans M.D.   On: 02/28/2023 13:48   DG Chest Portable 1 View Result Date: 02/28/2023 CLINICAL DATA:  Chest pain. EXAM: PORTABLE CHEST 1 VIEW COMPARISON:  January 31, 2023. FINDINGS: Stable cardiomediastinal silhouette. Right internal jugular dialysis catheter is unchanged. Both lungs are clear. The visualized skeletal structures are unremarkable. IMPRESSION: No active disease. Electronically Signed   By: Lupita Raider M.D.   On: 02/28/2023 12:32   DG Knee Right Port Result Date: 02/28/2023 CLINICAL DATA:  Amputation assessment. EXAM: PORTABLE RIGHT KNEE - 1-2 VIEW COMPARISON:  May 12, 2022. FINDINGS: Status post right below-knee amputation. Surgical staples are seen in the stomach. Stable degenerative changes seen involving the medial and lateral joint spaces. Stable osteochondroma arising medially from proximal tibia. IMPRESSION: Status post right below-knee amputation. Stable degenerative changes of right knee joint as well as osteo chondroma arising from proximal tibia. Electronically Signed   By: Lupita Raider M.D.   On: 02/28/2023 12:31       LOS: 1 day   Osvaldo Shipper  [facility] Pager on [email or web address]  03/02/2023, 9:14 AM

## 2023-03-03 DIAGNOSIS — T8789 Other complications of amputation stump: Secondary | ICD-10-CM | POA: Diagnosis not present

## 2023-03-03 DIAGNOSIS — Z992 Dependence on renal dialysis: Secondary | ICD-10-CM | POA: Diagnosis not present

## 2023-03-03 DIAGNOSIS — D649 Anemia, unspecified: Secondary | ICD-10-CM | POA: Diagnosis not present

## 2023-03-03 DIAGNOSIS — Z7189 Other specified counseling: Secondary | ICD-10-CM | POA: Diagnosis not present

## 2023-03-03 DIAGNOSIS — R197 Diarrhea, unspecified: Secondary | ICD-10-CM | POA: Diagnosis not present

## 2023-03-03 DIAGNOSIS — M79609 Pain in unspecified limb: Secondary | ICD-10-CM

## 2023-03-03 DIAGNOSIS — Z515 Encounter for palliative care: Secondary | ICD-10-CM | POA: Diagnosis not present

## 2023-03-03 DIAGNOSIS — N186 End stage renal disease: Secondary | ICD-10-CM | POA: Diagnosis not present

## 2023-03-03 LAB — GLUCOSE, CAPILLARY
Glucose-Capillary: 110 mg/dL — ABNORMAL HIGH (ref 70–99)
Glucose-Capillary: 85 mg/dL (ref 70–99)
Glucose-Capillary: 127 mg/dL — ABNORMAL HIGH (ref 70–99)
Glucose-Capillary: 163 mg/dL — ABNORMAL HIGH (ref 70–99)

## 2023-03-03 LAB — RENAL FUNCTION PANEL
Albumin: 2.4 g/dL — ABNORMAL LOW (ref 3.5–5.0)
Anion gap: 14 (ref 5–15)
BUN: 19 mg/dL (ref 6–20)
CO2: 16 mmol/L — ABNORMAL LOW (ref 22–32)
Calcium: 8.1 mg/dL — ABNORMAL LOW (ref 8.9–10.3)
Chloride: 107 mmol/L (ref 98–111)
Creatinine, Ser: 5.04 mg/dL — ABNORMAL HIGH (ref 0.44–1.00)
GFR, Estimated: 9 mL/min — ABNORMAL LOW (ref >60)
Glucose, Bld: 133 mg/dL — ABNORMAL HIGH (ref 70–99)
Phosphorus: 6.8 mg/dL — ABNORMAL HIGH (ref 2.5–4.6)
Potassium: 3.1 mmol/L — ABNORMAL LOW (ref 3.5–5.1)
Sodium: 137 mmol/L (ref 135–145)

## 2023-03-03 LAB — BASIC METABOLIC PANEL
Anion gap: 14 (ref 5–15)
BUN: 18 mg/dL (ref 6–20)
CO2: 17 mmol/L — ABNORMAL LOW (ref 22–32)
Calcium: 7.7 mg/dL — ABNORMAL LOW (ref 8.9–10.3)
Chloride: 106 mmol/L (ref 98–111)
Creatinine, Ser: 4.73 mg/dL — ABNORMAL HIGH (ref 0.44–1.00)
GFR, Estimated: 10 mL/min — ABNORMAL LOW (ref >60)
Glucose, Bld: 113 mg/dL — ABNORMAL HIGH (ref 70–99)
Potassium: 2.9 mmol/L — ABNORMAL LOW (ref 3.5–5.1)
Sodium: 137 mmol/L (ref 135–145)

## 2023-03-03 MED ORDER — HYDROMORPHONE HCL 1 MG/ML IJ SOLN
0.5000 mg | INTRAMUSCULAR | Status: DC | PRN
  Administered 2023-03-03 – 2023-03-06 (×21): 0.5 mg via INTRAVENOUS
  Filled 2023-03-03 (×22): qty 0.5

## 2023-03-03 MED ORDER — POTASSIUM CHLORIDE 20 MEQ PO PACK: 40.0000 meq | PACK | Freq: Once | ORAL | Status: DC

## 2023-03-03 MED ORDER — POTASSIUM CHLORIDE CRYS ER 20 MEQ PO TBCR
40.0000 meq | EXTENDED_RELEASE_TABLET | Freq: Once | ORAL | Status: AC
  Administered 2023-03-03: 40 meq via ORAL
  Filled 2023-03-03: qty 2

## 2023-03-03 MED ORDER — POTASSIUM CHLORIDE 10 MEQ/100ML IV SOLN
10.0000 meq | INTRAVENOUS | Status: DC
Start: 2023-03-03 — End: 2023-03-03
  Filled 2023-03-03: qty 100

## 2023-03-03 MED ORDER — HYDROMORPHONE HCL 2 MG PO TABS
4.0000 mg | ORAL_TABLET | Freq: Four times a day (QID) | ORAL | Status: DC | PRN
  Administered 2023-03-04: 4 mg via ORAL
  Filled 2023-03-03: qty 2

## 2023-03-03 MED ORDER — HYDROMORPHONE HCL 1 MG/ML IJ SOLN: 1.0000 mg | INTRAMUSCULAR | Status: DC | PRN

## 2023-03-03 MED ORDER — POTASSIUM CHLORIDE 20 MEQ PO PACK: 40.0000 meq | PACK | Freq: Two times a day (BID) | ORAL | Status: DC

## 2023-03-03 NOTE — Progress Notes (Signed)
   03/03/23 1404  TOC Brief Assessment  Insurance and Status Reviewed [facility] Medicare Dual Complete)  Home environment has been reviewed From home with Husband  Prior level of function: bed bound  Prior/Current Home Services Current home services [facility])  Social Drivers of Health Review SDOH reviewed no interventions necessary (Will assist with transportation home if needed)  Readmission risk has been reviewed Yes (59%)  Transition of care needs no transition of care needs at this time (Will reiterate PCS services and encourage patient to call insurance company to inquire about assistance)   RNCM spoke with CSW . Patient/Mom wanted SW to come in room . RNCM was on unit and tried to assist. Patient was not happy with me in room- Stated "I can't stand her" . Mom asker her to stop and then told me she  wanted assistance with someone staying at Daughter's house from 3-6 daily. RNCM explained that those services fall under a personal care service (PCS) umbrella . RNCM explained they are out of pocket. Mom mentioned needing transportation services for Daughter to HD. Patient said out loud that she has HD at home. Mom said no- she goes to OP HD  I tried to confirm either of these with patient  She refused to answer me and then stated she does go [facility] and began raising her voice at me stating I was rude to her last time she was here and embarrassed her Husband. I have no idea why she is so upset with me. I apologized and asked if she wanted someone else to assist her. She yelled "yes" so I left and contacted CSW. I asked CSW to remind Mom/patient that I had given patient a list of resources for [facility] services last admission. Patient will need to call [facility] to see what other services they can provide insured.

## 2023-03-03 NOTE — Progress Notes (Signed)
[facility] PROGRESS NOTE   JONAE COBLE UVO:[id number] DOB: 25-Sep-1965 DOA: 02/28/2023  PCP: Pcp, No  Brief History: 57 y.o. female with medical history significant for hypertension, diabetes, neuropathy, gastroparesis, GERD, anemia, paroxysmal atrial fibrillation, ESRD on HD, CHF, gout, duodenal carcinoid tumor, status post BKA on the right, history of gastric AVM, NASH, tardive dyskinesia, depression, anxiety, obesity, restrictive lung disease, spinal stenosis, chronic pain presenting with abdominal pain and diarrhea.  She has had history of same in the past and has undergone outpatient workup.  Followed by gastroenterology.  Apparently she was under consideration for somatostatin injections considering her history of carcinoid tumor.  She was hospitalized for further management.  CT scan did not show any acute findings.  Consultants: Nephrology  Procedures: None    Subjective/Interval History: Patient mentions that she had about 4 loose bowel movements yesterday.  None overnight.  Continues to have pain in the right lower extremity and some in the abdomen.  No nausea or vomiting.     Assessment/Plan:  Intractable diarrhea/history of carcinoid tumor/abdominal pain C. difficile was negative.  GI pathogen panel is negative. Patient has had a longstanding history of diarrhea and was under consideration for somatostatin.  Unclear which GI practice she was following with.  She mentions that she has not seen any gastroenterologist here in town in the outpatient setting.  Has been seen by gastroenterology while she has been in the hospital for GI bleed.  The last gastroenterologist she was following with on a regular basis was in [place] which was about 2 years ago. Her last colonoscopy was in 2021 which showed polyps and internal hemorrhoids. Underwent capsule endoscopy in October which did not show any active bleeding. Underwent upper endoscopy in October which showed  esophagitis, gastritis and gastric angiectasia's. CT abdomen pelvis did not show any acute findings. Patient was started on scheduled Imodium yesterday.  Diarrhea appears to be slowing down.  Continue with probiotics as well.  Hopefully she will improve with the strategy.  End-stage renal disease on hemodialysis/electrolyte abnormalities She is dialyzed on Tuesday Thursday Saturday schedule.  Nephrology is following.  Essential hypertension/hypotension Low blood pressures noted yesterday.  Likely due to hypovolemia from diarrhea.  Improved with fluid bolus.  Paroxysmal atrial fibrillation Continue metoprolol.  No longer on Eliquis due to GI bleeding in 2024 October. Continue to monitor on telemetry  Normocytic anemia Likely multifactorial.  No evidence for overt bleeding.  Chronic diastolic CHF Volume being managed with hemodialysis.  Diabetes mellitus in the setting of end-stage renal disease Monitor CBGs.  Stable  Status post right BKA/wound infection Was on IV antibiotics recently.  Now on oral antibiotics.  Noted to be on cefadroxil and doxycycline and Flagyl. She still has staples in the right stump.  Has an appointment with Dr. Lajoyce Corners on 12/30.  History of depression and anxiety Continue home medications.  History of chronic pain syndrome/spinal stenosis Continue home medications.  Goals of care Palliative care following.  Obesity Estimated body mass index is 38.86 kg/m as calculated from the following:   Height as of this encounter: 5\' 6"  (1.676 m).   Weight as of this encounter: 109.2 kg.   DVT Prophylaxis: Subcutaneous heparin Code Status: Full code Family Communication: Discussed with patient Disposition Plan: To be determined.  PT and OT evaluation.      Medications: Scheduled:  allopurinol  100 mg Oral BID   atorvastatin  10 mg Oral QPM   cefadroxil  500 mg Oral Daily  doxycycline  100 mg Oral BID   Gerhardt's butt cream   Topical TID   heparin   5,000 Units Subcutaneous Q8H   insulin aspart  0-6 Units Subcutaneous TID WC   loperamide  4 mg Oral TID   metoprolol tartrate  50 mg Oral BID   metroNIDAZOLE  500 mg Oral Q12H   mirtazapine  15 mg Oral QHS   pantoprazole  40 mg Oral BID   potassium chloride  40 mEq Oral Once   saccharomyces boulardii  250 mg Oral BID   sodium chloride flush  3 mL Intravenous Q12H   Continuous:   QQV:[id number] **OR** acetaminophen, [COMPLETED]  HYDROmorphone (DILAUDID) injection **FOLLOWED BY** HYDROmorphone (DILAUDID) injection, HYDROmorphone, hydrOXYzine, methocarbamol, metoprolol tartrate, polyethylene glycol  Antibiotics: Anti-infectives (From admission, onward)    Start     Dose/Rate Route Frequency Ordered Stop   03/01/23 1000  cefadroxil (DURICEF) capsule 500 mg        500 mg Oral Daily 02/28/23 1533     02/28/23 2200  doxycycline (VIBRA-TABS) tablet 100 mg        100 mg Oral 2 times daily 02/28/23 1533     02/28/23 2200  metroNIDAZOLE (FLAGYL) tablet 500 mg        500 mg Oral Every 12 hours 02/28/23 1533         Objective:  Vital Signs  Vitals:   03/03/23 0115 03/03/23 0408 03/03/23 0702 03/03/23 0815  BP:  136/69  126/74  Pulse:    100  Resp: 20 20 20  (!) 21  Temp:  98 F (36.7 C)  98 F (36.7 C)  TempSrc:  Oral  Oral  SpO2:      Weight:      Height:        Intake/Output Summary (Last 24 hours) at 03/03/2023 0838 Last data filed at 03/02/2023 1722 Gross per 24 hour  Intake 1000 ml  Output --  Net 1000 ml   Filed Weights   02/28/23 1039 03/01/23 0451 03/01/23 1133  Weight: 113.4 kg 111.9 kg 109.2 kg    General appearance: Awake alert.  In no distress Resp: Clear to auscultation bilaterally.  Normal effort Cardio: S1-S2 is normal regular.  No S3-S4.  No rubs murmurs or bruit GI: Abdomen is soft.  Nonspecific tenderness mainly in the lower abdomen without any rebound rigidity or guarding. Extremities: Right AKA noted with staples.   Lab Results:  Data  Reviewed: I have personally reviewed following labs and reports of the imaging studies  CBC: Recent Labs  Lab 02/28/23 1129 02/28/23 1139 03/01/23 0446 03/02/23 0409  WBC 17.2*  --  14.9* 13.3*  NEUTROABS 12.9*  --   --   --   HGB 9.1* 9.9* 9.7* 8.1*  HCT 29.8* 29.0* 31.8* 26.5*  MCV 96.1  --  95.2 94.3  PLT 423*  --  346 310    Basic Metabolic Panel: Recent Labs  Lab 02/28/23 1129 02/28/23 1139 03/01/23 0446 03/02/23 0409 03/03/23 0421  NA 141 144 141 137 137  K 3.0* 3.0* 2.9* 2.4* 2.9*  CL 114* 114* 113* 105 106  CO2 13*  --  12* 20* 17*  GLUCOSE 132* 128* 145* 78 113*  BUN 27* 31* 28* 16 18  CREATININE 5.36* 5.80* 5.30* 3.83* 4.73*  CALCIUM 7.9*  --  7.9* 7.5* 7.7*    GFR: Estimated Creatinine Clearance: 16.2 mL/min (A) (by C-G formula based on SCr of 4.73 mg/dL (H)).  Liver Function Tests: Recent Labs  Lab 02/28/23 1129 03/01/23 0446 03/02/23 0409  AST 26 21 18   ALT 14 15 13   ALKPHOS 73 73 68  BILITOT 0.6 0.6 0.6  PROT 7.0 6.4* 5.9*  ALBUMIN 2.5* 2.3* 2.1*    Recent Labs  Lab 02/28/23 1129  LIPASE 40     CBG: Recent Labs  Lab 03/02/23 0940 03/02/23 1322 03/02/23 1700 03/02/23 2107 03/03/23 0817  GLUCAP 155* 98 75 147* 110*     Recent Results (from the past 240 hours)  Gastrointestinal Panel by PCR , Stool     Status: None   Collection Time: 02/28/23 11:16 AM   Specimen: Stool  Result Value Ref Range Status   Campylobacter species NOT DETECTED NOT DETECTED Final   Plesimonas shigelloides NOT DETECTED NOT DETECTED Final   Salmonella species NOT DETECTED NOT DETECTED Final   Yersinia enterocolitica NOT DETECTED NOT DETECTED Final   Vibrio species NOT DETECTED NOT DETECTED Final   Vibrio cholerae NOT DETECTED NOT DETECTED Final   Enteroaggregative E coli (EAEC) NOT DETECTED NOT DETECTED Final   Enteropathogenic E coli (EPEC) NOT DETECTED NOT DETECTED Final   Enterotoxigenic E coli (ETEC) NOT DETECTED NOT DETECTED Final   Shiga like  toxin producing E coli (STEC) NOT DETECTED NOT DETECTED Final   Shigella/Enteroinvasive E coli (EIEC) NOT DETECTED NOT DETECTED Final   Cryptosporidium NOT DETECTED NOT DETECTED Final   Cyclospora cayetanensis NOT DETECTED NOT DETECTED Final   Entamoeba histolytica NOT DETECTED NOT DETECTED Final   Giardia lamblia NOT DETECTED NOT DETECTED Final   Adenovirus F40/41 NOT DETECTED NOT DETECTED Final   Astrovirus NOT DETECTED NOT DETECTED Final   Norovirus GI/GII NOT DETECTED NOT DETECTED Final   Rotavirus A NOT DETECTED NOT DETECTED Final   Sapovirus (I, II, IV, and V) NOT DETECTED NOT DETECTED Final    Comment: Performed at [facility], [address] [place], [place] [address]  C Difficile Quick Screen w PCR reflex     Status: None   Collection Time: 02/28/23 11:16 AM   Specimen: Stool  Result Value Ref Range Status   C Diff antigen NEGATIVE NEGATIVE Final   C Diff toxin NEGATIVE NEGATIVE Final   C Diff interpretation No C. difficile detected.  Final    Comment: Performed at [facility], [address], [place], [place] [address]      Radiology Studies: No results found.      LOS: 2 days   Debra Colon Rito Ehrlich  [facility] Pager on [email or web address]  03/03/2023, 8:38 AM

## 2023-03-03 NOTE — TOC Progression Note (Signed)
Transition of Care [facility]) - Progression Note    Patient Details  Name: Deborah [name] MRN: [id number] Date of Birth: Mar 27, 1965  Transition of Care [facility]) CM/SW Contact  Mearl Latin, LCSW Phone Number: 03/03/2023, 5:25 PM  Clinical Narrative:    CSW met with patient's mother in the front lobby. She shared concern that patient needs three hours of increased aide services in the afternoon while waiting on her husband to return home. She currently has home health through [facility] and has an aide but is requesting increased hours. CSW provided community listing of private duty agencies and the contact info for DSS to apply for increased services. Mother reported appreciation for listening. She stated patient has two steps to get out to the sidewalk and she is not sure her landlord will allow her to place a ramp so it is hindering her ability to go to dialysis because transport will not go in the house to get her.   CSW met with patient and she shared that she needed to apologize to the [facility] but that she was upset with how she "made it sound like" patient was returning home alone and she was not, she just needed her neighbor to go to her house to check to see if her husband was home yet so she could return. CSW reported understanding. Patient requested an application to CAPS services. CSW will look into but typically PCP has to apply for that; patient aware. CSW will also complete PCS form to see if patient can get increased services. Patient confirmed she uses [employer] through [employer].         Expected Discharge Plan and Services                                               Social Determinants of Health (SDOH) Interventions SDOH Screenings   Food Insecurity: No Food Insecurity (02/28/2023)  Recent Concern: Food Insecurity - High Risk (01/25/2023)   Received from [facility]  Housing: High Risk (02/28/2023)  Transportation Needs: Unmet Transportation Needs  (02/28/2023)  Utilities: Not At Risk (02/28/2023)  Recent Concern: Utilities - Medium Risk (01/25/2023)   Received from [facility]  Depression (PHQ2-9): High Risk (11/18/2022)  Financial Resource Strain: Low Risk (03/23/2021)   Received from [facility])  Physical Activity: Not on File (10/31/2017)   Received from [place], [place]  Social Connections: Unknown (07/19/2021)   Received from [facility]  Stress: Low Risk (03/23/2021)   Received from [facility])  Tobacco Use: Low Risk  (02/28/2023)    Readmission Risk Interventions    11/25/2022    5:16 PM 07/05/2022    1:20 PM  Readmission Risk Prevention Plan  Transportation Screening Complete Complete  Medication Review (RN Care Manager) Complete Referral to Pharmacy  PCP or Specialist appointment within 3-5 days of discharge Complete Complete  HRI or Home Care Consult Complete Complete  SW Recovery Care/Counseling Consult Complete Complete  Palliative Care Screening Not Applicable Not Applicable  Skilled Nursing Facility Not Applicable Not Applicable

## 2023-03-03 NOTE — Progress Notes (Signed)
Palliative:  HPI: 57 y.o. female with past medical history of hypertension, diabetes, neuropathy, gastroparesis, GERD, anemia, PAF, restrictive lung disease, spinal stenosis, ESRD on HD TTS, gout, duodenal carcinoid tumor, s/p R BKA, gastric AVM, NASH, tardive dyskinesia, depression, anxiety admitted on 02/28/2023 with worsening abdominal pain and diarrhea.    I came to visit with Deborah [name] but she is sleeping. I returned later in the day and she is having new IV placed. Mother is at bedside. Deborah [name] continues to have severe pain ongoing. She is having some relief from pain with IV dilaudid. She reports that po dilaudid does not help at home. I will reach out to her pain management provider to see if we can escalate pain regimen. I explained to Deborah [name] that I can adjust pain medication BUT if there is no provider to prescribe once she leaves the hospital this is not helpful.   We had a long conversation about Deborah [name] suffering. Her mother has strong feelings that her daughter should have faith and "not give up." There was much debating back and forth. Deborah [name] attempts to explain that she has no quality of life and is suffering. She explains about her ongoing pain and lost of independence and dignity. She explains that she hates being a burden and having to rely on others for everything. Her mother continues to believe she should continue to pray and rely on God. They are struggling to come to an agreement. I spoke more with mother as Deborah [name] receives care from nursing staff. Mother shares the many family members that she has lost including husband, 2 daughters and granddaughter. She believes that her daughter needs to keep faith that her situation could improve.   I reached out to Jacalyn Lefevre, NP to see if we can escalate pain medication. She tells me to reach out to Dr. Dalene Carrow to discuss - I will do so tomorrow.   All questions/concerns addressed. Emotional support  provided.   Exam: Alert, oriented. Agitated and restless from pain. Also clearly with existential pain and suffering. Breathing regular, unlabored. Abd soft. Moves all extremities. R BKA.    Plan: - Full code, full scope - Ongoing goals of care discussions - Severe pain:             - Consult to Physical Medicine who have been managing pain outpatient.              - Continue previous dilaudid 4 mg po will do q6h PRN. May give dilaudid 0.5 mg IV q3h PRN breakthrough pain.              - Consider Cymbalta.              - Consider gabapentin/lyrica although dosage limited by dialysis.   50 min  Yong Channel, NP Palliative Medicine Team Pager [phone] (Please see [email or web address] for schedule) Team Phone [phone]

## 2023-03-03 NOTE — Progress Notes (Signed)
[facility] Progress Note   Subjective: Seen in room. "Deborah [name] I am thinking about going to hospice". patient had long talk with me about going to hospice/comfort care. I have explained what death is like for most ESRD patients and gave her a general idea of how OP hospice works. She says is tired of pain, has no quality of life. I have told her that we will support whatever decision she makes. Palliative Care is consulted and following  HD later today on schedule.      Objective Vitals:   03/03/23 0115 03/03/23 0408 03/03/23 0702 03/03/23 0815  BP:  136/69  126/74  Pulse:    100  Resp: 20 20 20  (!) 21  Temp:  98 F (36.7 C)  98 F (36.7 C)  TempSrc:  Oral  Oral  SpO2:      Weight:      Height:       Physical Exam General: Obese female in NAD Heart: ST on monitor. S1,S2 No M/R/G Lungs: CTAB A/P Abdomen: obese NABS Extremities: R BKA staples intact No LLE edema Dialysis Access: RIJ TDC drsg intact   Additional Objective Labs: Basic Metabolic Panel: Recent Labs  Lab 03/01/23 0446 03/02/23 0409 03/03/23 0421  NA 141 137 137  K 2.9* 2.4* 2.9*  CL 113* 105 106  CO2 12* 20* 17*  GLUCOSE 145* 78 113*  BUN 28* 16 18  CREATININE 5.30* 3.83* 4.73*  CALCIUM 7.9* 7.5* 7.7*   Liver Function Tests: Recent Labs  Lab 02/28/23 1129 03/01/23 0446 03/02/23 0409  AST 26 21 18   ALT 14 15 13   ALKPHOS 73 73 68  BILITOT 0.6 0.6 0.6  PROT 7.0 6.4* 5.9*  ALBUMIN 2.5* 2.3* 2.1*   Recent Labs  Lab 02/28/23 1129  LIPASE 40   CBC: Recent Labs  Lab 02/28/23 1129 02/28/23 1139 03/01/23 0446 03/02/23 0409  WBC 17.2*  --  14.9* 13.3*  NEUTROABS 12.9*  --   --   --   HGB 9.1* 9.9* 9.7* 8.1*  HCT 29.8* 29.0* 31.8* 26.5*  MCV 96.1  --  95.2 94.3  PLT 423*  --  346 310   Blood Culture    Component Value Date/Time   SDES BLOOD BLOOD RIGHT HAND 02/17/2023 2243   SPECREQUEST  02/17/2023 2243    BOTTLES DRAWN AEROBIC AND ANAEROBIC Blood Culture results  may not be optimal due to an inadequate volume of blood received in culture bottles   CULT  02/17/2023 2243    NO GROWTH 5 DAYS Performed at [facility], [address] [place], [place] [address]    REPTSTATUS 02/22/2023 FINAL 02/17/2023 2243    Cardiac Enzymes: No results for input(s): "CKTOTAL", "CKMB", "CKMBINDEX", "TROPONINI" in the last 168 hours. CBG: Recent Labs  Lab 03/02/23 0940 03/02/23 1322 03/02/23 1700 03/02/23 2107 03/03/23 0817  GLUCAP 155* 98 75 147* 110*   Iron Studies: No results for input(s): "IRON", "TIBC", "TRANSFERRIN", "FERRITIN" in the last 72 hours. @lablastinr3 @ Studies/Results: No results found. Medications:   allopurinol  100 mg Oral BID   atorvastatin  10 mg Oral QPM   cefadroxil  500 mg Oral Daily   doxycycline  100 mg Oral BID   Gerhardt's butt cream   Topical TID   heparin  5,000 Units Subcutaneous Q8H   insulin aspart  0-6 Units Subcutaneous TID WC   loperamide  4 mg Oral TID   metoprolol tartrate  50 mg Oral BID  metroNIDAZOLE  500 mg Oral Q12H   mirtazapine  15 mg Oral QHS   pantoprazole  40 mg Oral BID   potassium chloride  40 mEq Oral Once   saccharomyces boulardii  250 mg Oral BID   sodium chloride flush  3 mL Intravenous Q12H     Deborah [name] is an 57 y.o. female w/ sciatica, carcinoid tumor of the duodenum, HFrEF, DM, gastroparesis, HTN, NICM, PAF, CVA, ESRD dialyzing Tuesday Thursday Saturdays at [facility] with Dr. Signe Colt.  Patient missed dialysis on Saturday because she was not feeling well mainly because of the pain having run out of her opioids.  Patient has a appointment with pain clinic in the next few weeks.  Patient has some mild shortness of breath but the main complaint right now is pain all over especially in the abdomen and right leg.     OP HD: TTS [facility]  4h  400/800  112kg   3K/2.5Ca bath   TDC   Heparin none - last OP HD 12/19 left at 116 kg  - has been usually reaching dry wt - venofer 100mg  q hd  thru 12/04 - mircera 100 mcg q 2 wks, last 10/5, due 10/19   Assessment/Plan: ESKD - on HD TTS. HD today on schedule.  Hypokalemia: K+ 2.9 this AM. Give KCL 40 MEQ now and IV KCL 10 MEQ in 100cc NS over 1 hour X 2 doses. Repeat labs this afternoon at 1600 HTN - bp's wnl, takes metoprolol at home. Cont meds as needed.  Volume - no gross vol excess on exam she is still short of breath, improved with dialysis low, and we will plan on ultrafiltration tomorrow with dialysis as tolerated. Anemia of eskd - Hb 9.9 MBD ckd - CCa in range. Add on phos. Binders w/ meals.  Afib - not on anticoagulation because of GI bleeding. Chronic pain syndrome on narcotics PAD with rt BKA 10/23 with h/o stump dehiscence.   Deborah [name] H. Deborah Geer NP-C 03/03/2023, 10:53 AM  [employer] [phone]

## 2023-03-03 NOTE — Plan of Care (Signed)
  Problem: Education: Goal: Ability to describe self-care measures that may prevent or decrease complications (Diabetes Survival Skills Education) will improve Outcome: Progressing   Problem: Coping: Goal: Ability to adjust to condition or change in health will improve Outcome: Progressing   Problem: Health Behavior/Discharge Planning: Goal: Ability to identify and utilize available resources and services will improve Outcome: Progressing Goal: Ability to manage health-related needs will improve Outcome: Progressing   Problem: Metabolic: Goal: Ability to maintain appropriate glucose levels will improve Outcome: Progressing   Problem: Education: Goal: Knowledge of General Education information will improve Description: Including pain rating scale, medication(s)/side effects and non-pharmacologic comfort measures Outcome: Progressing   Problem: Health Behavior/Discharge Planning: Goal: Ability to manage health-related needs will improve Outcome: Progressing   Problem: Clinical Measurements: Goal: Will remain free from infection Outcome: Progressing   Problem: Activity: Goal: Risk for activity intolerance will decrease Outcome: Progressing   Problem: Coping: Goal: Level of anxiety will decrease Outcome: Progressing   Problem: Pain Management: Goal: General experience of comfort will improve Outcome: Progressing

## 2023-03-04 ENCOUNTER — Ambulatory Visit: Payer: 59 | Admitting: Registered Nurse

## 2023-03-04 DIAGNOSIS — G546 Phantom limb syndrome with pain: Secondary | ICD-10-CM | POA: Diagnosis not present

## 2023-03-04 DIAGNOSIS — R197 Diarrhea, unspecified: Secondary | ICD-10-CM | POA: Diagnosis not present

## 2023-03-04 DIAGNOSIS — I10 Essential (primary) hypertension: Secondary | ICD-10-CM

## 2023-03-04 DIAGNOSIS — N186 End stage renal disease: Secondary | ICD-10-CM | POA: Diagnosis not present

## 2023-03-04 DIAGNOSIS — Z515 Encounter for palliative care: Secondary | ICD-10-CM | POA: Diagnosis not present

## 2023-03-04 DIAGNOSIS — Z7189 Other specified counseling: Secondary | ICD-10-CM | POA: Diagnosis not present

## 2023-03-04 LAB — BASIC METABOLIC PANEL
Anion gap: 14 (ref 5–15)
BUN: 22 mg/dL — ABNORMAL HIGH (ref 6–20)
CO2: 13 mmol/L — ABNORMAL LOW (ref 22–32)
Calcium: 8.1 mg/dL — ABNORMAL LOW (ref 8.9–10.3)
Chloride: 108 mmol/L (ref 98–111)
Creatinine, Ser: 5.94 mg/dL — ABNORMAL HIGH (ref 0.44–1.00)
GFR, Estimated: 8 mL/min — ABNORMAL LOW (ref >60)
Glucose, Bld: 126 mg/dL — ABNORMAL HIGH (ref 70–99)
Potassium: 3.8 mmol/L (ref 3.5–5.1)
Sodium: 135 mmol/L (ref 135–145)

## 2023-03-04 LAB — CBC
HCT: 28.9 % — ABNORMAL LOW (ref 36.0–46.0)
Hemoglobin: 9.1 g/dL — ABNORMAL LOW (ref 12.0–15.0)
MCH: 29.9 pg (ref 26.0–34.0)
MCHC: 31.5 g/dL (ref 30.0–36.0)
MCV: 95.1 fL (ref 80.0–100.0)
Platelets: 320 10*3/uL (ref 150–400)
RBC: 3.04 MIL/uL — ABNORMAL LOW (ref 3.87–5.11)
RDW: 15.5 % (ref 11.5–15.5)
WBC: 16.8 10*3/uL — ABNORMAL HIGH (ref 4.0–10.5)
nRBC: 0 % (ref 0.0–0.2)

## 2023-03-04 LAB — GLUCOSE, CAPILLARY
Glucose-Capillary: 152 mg/dL — ABNORMAL HIGH (ref 70–99)
Glucose-Capillary: 128 mg/dL — ABNORMAL HIGH (ref 70–99)
Glucose-Capillary: 113 mg/dL — ABNORMAL HIGH (ref 70–99)

## 2023-03-04 MED ORDER — HYDROMORPHONE HCL 2 MG PO TABS: 6.0000 mg | ORAL_TABLET | Freq: Four times a day (QID) | ORAL | Status: DC | PRN

## 2023-03-04 MED ORDER — HYDROMORPHONE HCL 2 MG PO TABS
4.0000 mg | ORAL_TABLET | Freq: Four times a day (QID) | ORAL | Status: DC | PRN
  Administered 2023-03-06 (×2): 4 mg via ORAL
  Filled 2023-03-04 (×3): qty 2

## 2023-03-04 MED ORDER — DARBEPOETIN ALFA 25 MCG/0.42ML IJ SOSY
25.0000 ug | PREFILLED_SYRINGE | INTRAMUSCULAR | Status: DC
  Administered 2023-03-05: 25 ug via SUBCUTANEOUS
  Filled 2023-03-04: qty 0.42

## 2023-03-04 MED ORDER — FENTANYL 25 MCG/HR TD PT72
1.0000 | MEDICATED_PATCH | TRANSDERMAL | Status: DC
  Administered 2023-03-04 – 2023-03-07 (×2): 1 via TRANSDERMAL
  Filled 2023-03-04 (×2): qty 1

## 2023-03-04 MED ORDER — HYDROXYZINE HCL 25 MG PO TABS
25.0000 mg | ORAL_TABLET | Freq: Two times a day (BID) | ORAL | Status: DC
  Administered 2023-03-04 – 2023-03-08 (×10): 25 mg via ORAL
  Filled 2023-03-04 (×10): qty 1

## 2023-03-04 MED ORDER — HYDROXYZINE HCL 25 MG PO TABS
25.0000 mg | ORAL_TABLET | Freq: Every day | ORAL | Status: DC | PRN
  Administered 2023-03-06 – 2023-03-07 (×2): 25 mg via ORAL
  Filled 2023-03-04 (×2): qty 1

## 2023-03-04 MED ORDER — HYDROMORPHONE HCL 1 MG/ML IJ SOLN
0.5000 mg | Freq: Once | INTRAMUSCULAR | Status: AC
  Administered 2023-03-04: 0.5 mg via INTRAVENOUS
  Filled 2023-03-04: qty 0.5

## 2023-03-04 MED ORDER — LOPERAMIDE HCL 2 MG PO CAPS
2.0000 mg | ORAL_CAPSULE | Freq: Two times a day (BID) | ORAL | Status: DC
  Administered 2023-03-04 – 2023-03-05 (×3): 2 mg via ORAL
  Filled 2023-03-04 (×3): qty 1

## 2023-03-04 NOTE — Progress Notes (Signed)
PT Cancellation Note  Patient Details Name: Deborah [name] MRN: [id number] DOB: 06-02-1965   Cancelled Treatment:    Reason Eval/Treat Not Completed: PT screened, no needs identified, will sign off (Pt is known from previous admissions and is a hoyer lift at baseline. No PT needs identified. Will sign off.)   [name] 03/04/2023, 10:38 AM

## 2023-03-04 NOTE — Progress Notes (Signed)
[facility] PROGRESS NOTE   Deborah [name] ZOX:[id number] DOB: 1965-11-02 DOA: 02/28/2023  PCP: Pcp, No  Brief History: 57 y.o. female with medical history significant for hypertension, diabetes, neuropathy, gastroparesis, GERD, anemia, paroxysmal atrial fibrillation, ESRD on HD, CHF, gout, duodenal carcinoid tumor, status post BKA on the right, history of gastric AVM, NASH, tardive dyskinesia, depression, anxiety, obesity, restrictive lung disease, spinal stenosis, chronic pain presenting with abdominal pain and diarrhea.  She has had history of same in the past and has undergone outpatient workup.  Followed by gastroenterology.  Apparently she was under consideration for somatostatin injections considering her history of carcinoid tumor.  She was hospitalized for further management.  CT scan did not show any acute findings.  Consultants: Nephrology, Palliative  Procedures: None    Subjective/Interval History:  No BMs overnight as discussed with staff, patient still reports poor appetite, significant change in her abdominal pain.   Assessment/Plan:  Intractable diarrhea/history of carcinoid tumor/abdominal pain C. difficile was negative.  GI pathogen panel is negative. Patient has had a longstanding history of diarrhea and was under consideration for somatostatin.  Unclear which GI practice she was following with.  She mentions that she has not seen any gastroenterologist here in town in the outpatient setting.  Has been seen by gastroenterology while she has been in the hospital for GI bleed.  The last gastroenterologist she was following with on a regular basis was in [place] which was about 2 years ago. Her last colonoscopy was in 2021 which showed polyps and internal hemorrhoids. Underwent capsule endoscopy in October which did not show any active bleeding. Underwent upper endoscopy in October which showed esophagitis, gastritis and gastric angiectasia's. CT  abdomen pelvis did not show any acute findings. Continue with scheduled Imodium, diarrhea much improved, no BM overnight as discussed with staff patient was started on scheduled Imodium yesterday.  End-stage renal disease on hemodialysis/electrolyte abnormalities She is dialyzed on Tuesday Thursday Saturday schedule.  Nephrology is following.  She refused HD today.  Essential hypertension/hypotension Blood pressure remains soft, but stable continue to hold home meds  Paroxysmal atrial fibrillation Continue metoprolol.  No longer on Eliquis due to GI bleeding in 2024 October. Continue to monitor on telemetry  Normocytic anemia Likely multifactorial.  No evidence for overt bleeding.  Chronic diastolic CHF Volume being managed with hemodialysis.  Diabetes mellitus in the setting of end-stage renal disease Monitor CBGs.  Stable  Status post right BKA/wound infection Was on IV antibiotics recently.  Now on oral antibiotics.  Noted to be on cefadroxil and doxycycline and Flagyl. She still has staples in the right stump.  Has an appointment with Dr. Lajoyce Corners on 12/30. -PT-OT consulted  History of depression and anxiety Continue home medications.  History of chronic pain syndrome/spinal stenosis Continue home medications.  Goals of care Palliative care following.  Obesity Estimated body mass index is 38.86 kg/m as calculated from the following:   Height as of this encounter: 5\' 6"  (1.676 m).   Weight as of this encounter: 109.2 kg.   DVT Prophylaxis: Subcutaneous heparin Code Status: Full code Family Communication: Discussed with patient Disposition Plan: To be determined.  PT and OT evaluation.      Medications: Scheduled:  allopurinol  100 mg Oral BID   atorvastatin  10 mg Oral QPM   cefadroxil  500 mg Oral Daily   doxycycline  100 mg Oral BID   Gerhardt's butt cream   Topical TID   heparin  5,000 Units Subcutaneous  Q8H   insulin aspart  0-6 Units Subcutaneous TID WC    metoprolol tartrate  50 mg Oral BID   metroNIDAZOLE  500 mg Oral Q12H   mirtazapine  15 mg Oral QHS   pantoprazole  40 mg Oral BID   saccharomyces boulardii  250 mg Oral BID   sodium chloride flush  3 mL Intravenous Q12H   Continuous:   NWG:[id number] **OR** acetaminophen, [COMPLETED]  HYDROmorphone (DILAUDID) injection **FOLLOWED BY** HYDROmorphone (DILAUDID) injection, HYDROmorphone, hydrOXYzine, methocarbamol, metoprolol tartrate, polyethylene glycol  Antibiotics: Anti-infectives (From admission, onward)    Start     Dose/Rate Route Frequency Ordered Stop   03/01/23 1000  cefadroxil (DURICEF) capsule 500 mg        500 mg Oral Daily 02/28/23 1533     02/28/23 2200  doxycycline (VIBRA-TABS) tablet 100 mg        100 mg Oral 2 times daily 02/28/23 1533     02/28/23 2200  metroNIDAZOLE (FLAGYL) tablet 500 mg        500 mg Oral Every 12 hours 02/28/23 1533         Objective:  Vital Signs  Vitals:   03/04/23 0023 03/04/23 0024 03/04/23 0323 03/04/23 0524  BP:    107/67  Pulse:    87  Resp: 18 (!) 21  (!) 23  Temp:    98 F (36.7 C)  TempSrc:    Oral  SpO2:    100%  Weight:   109.2 kg   Height:        Intake/Output Summary (Last 24 hours) at 03/04/2023 1009 Last data filed at 03/04/2023 0000 Gross per 24 hour  Intake 500 ml  Output --  Net 500 ml   Filed Weights   03/01/23 0451 03/01/23 1133 03/04/23 0323  Weight: 111.9 kg 109.2 kg 109.2 kg    Awake Alert, Oriented X 3, no apparent distress, sitting at the edge of the bed Symmetrical Chest wall movement, Good air movement bilaterally, CTAB post RRR,No Gallops,Rubs or new Murmurs, No Parasternal Heave Right BKA   Lab Results:  Data Reviewed: I have personally reviewed following labs and reports of the imaging studies  CBC: Recent Labs  Lab 02/28/23 1129 02/28/23 1139 03/01/23 0446 03/02/23 0409 03/04/23 0512  WBC 17.2*  --  14.9* 13.3* 16.8*  NEUTROABS 12.9*  --   --   --   --   HGB 9.1* 9.9*  9.7* 8.1* 9.1*  HCT 29.8* 29.0* 31.8* 26.5* 28.9*  MCV 96.1  --  95.2 94.3 95.1  PLT 423*  --  346 310 320    Basic Metabolic Panel: Recent Labs  Lab 03/01/23 0446 03/02/23 0409 03/03/23 0421 03/03/23 1616 03/04/23 0512  NA 141 137 137 137 135  K 2.9* 2.4* 2.9* 3.1* 3.8  CL 113* 105 106 107 108  CO2 12* 20* 17* 16* 13*  GLUCOSE 145* 78 113* 133* 126*  BUN 28* 16 18 19  22*  CREATININE 5.30* 3.83* 4.73* 5.04* 5.94*  CALCIUM 7.9* 7.5* 7.7* 8.1* 8.1*  PHOS  --   --   --  6.8*  --     GFR: Estimated Creatinine Clearance: 12.9 mL/min (A) (by C-G formula based on SCr of 5.94 mg/dL (H)).  Liver Function Tests: Recent Labs  Lab 02/28/23 1129 03/01/23 0446 03/02/23 0409 03/03/23 1616  AST 26 21 18   --   ALT 14 15 13   --   ALKPHOS 73 73 68  --   BILITOT 0.6 0.6  0.6  --   PROT 7.0 6.4* 5.9*  --   ALBUMIN 2.5* 2.3* 2.1* 2.4*    Recent Labs  Lab 02/28/23 1129  LIPASE 40     CBG: Recent Labs  Lab 03/03/23 0817 03/03/23 1210 03/03/23 1857 03/03/23 2210 03/04/23 0937  GLUCAP 110* 85 127* 163* 152*     Recent Results (from the past 240 hours)  Gastrointestinal Panel by PCR , Stool     Status: None   Collection Time: 02/28/23 11:16 AM   Specimen: Stool  Result Value Ref Range Status   Campylobacter species NOT DETECTED NOT DETECTED Final   Plesimonas shigelloides NOT DETECTED NOT DETECTED Final   Salmonella species NOT DETECTED NOT DETECTED Final   Yersinia enterocolitica NOT DETECTED NOT DETECTED Final   Vibrio species NOT DETECTED NOT DETECTED Final   Vibrio cholerae NOT DETECTED NOT DETECTED Final   Enteroaggregative E coli (EAEC) NOT DETECTED NOT DETECTED Final   Enteropathogenic E coli (EPEC) NOT DETECTED NOT DETECTED Final   Enterotoxigenic E coli (ETEC) NOT DETECTED NOT DETECTED Final   Shiga like toxin producing E coli (STEC) NOT DETECTED NOT DETECTED Final   Shigella/Enteroinvasive E coli (EIEC) NOT DETECTED NOT DETECTED Final   Cryptosporidium NOT  DETECTED NOT DETECTED Final   Cyclospora cayetanensis NOT DETECTED NOT DETECTED Final   Entamoeba histolytica NOT DETECTED NOT DETECTED Final   Giardia lamblia NOT DETECTED NOT DETECTED Final   Adenovirus F40/41 NOT DETECTED NOT DETECTED Final   Astrovirus NOT DETECTED NOT DETECTED Final   Norovirus GI/GII NOT DETECTED NOT DETECTED Final   Rotavirus A NOT DETECTED NOT DETECTED Final   Sapovirus (I, II, IV, and V) NOT DETECTED NOT DETECTED Final    Comment: Performed at [facility], [address] [place], [place] [address]  C Difficile Quick Screen w PCR reflex     Status: None   Collection Time: 02/28/23 11:16 AM   Specimen: Stool  Result Value Ref Range Status   C Diff antigen NEGATIVE NEGATIVE Final   C Diff toxin NEGATIVE NEGATIVE Final   C Diff interpretation No C. difficile detected.  Final    Comment: Performed at [facility], [address] [place], [place] [address]      Radiology Studies: No results found.      LOS: 3 days   Huey Bienenstock MD  [facility] Pager on [email or web address]  03/04/2023, 10:09 AM

## 2023-03-04 NOTE — Care Management Important Message (Signed)
Important Message  Patient Details  Name: Deborah [name] MRN: [id number] Date of Birth: 03-Dec-1965   Important Message Given:  Yes - Medicare IM   Patient left prior to IM delivery will mail a copy to the patient home address.   Nyliah Nierenberg 03/04/2023, 3:15 PM

## 2023-03-04 NOTE — Plan of Care (Signed)

## 2023-03-04 NOTE — Progress Notes (Signed)
Pt refused HD LIP notified

## 2023-03-04 NOTE — Progress Notes (Signed)
[facility] Progress Note   Subjective: Refused HD today. Says she will have HD later today but no urgent HD needs. Labs are stable. Can roll over to 03/05/2023 if necessary. Chaplain at bedside.   Objective Vitals:   03/04/23 0023 03/04/23 0024 03/04/23 0323 03/04/23 0524  BP:    107/67  Pulse:    87  Resp: 18 (!) 21  (!) 23  Temp:    98 F (36.7 C)  TempSrc:    Oral  SpO2:    100%  Weight:   109.2 kg   Height:        Physical Exam General: Obese female in NAD Heart: ST on monitor. S1,S2 No M/R/G Lungs: CTAB A/P Abdomen: obese NABS Extremities: R BKA staples intact No LLE edema Dialysis Access: RIJ TDC drsg intact   Additional Objective Labs: Basic Metabolic Panel: Recent Labs  Lab 03/03/23 0421 03/03/23 1616 03/04/23 0512  NA 137 137 135  K 2.9* 3.1* 3.8  CL 106 107 108  CO2 17* 16* 13*  GLUCOSE 113* 133* 126*  BUN 18 19 22*  CREATININE 4.73* 5.04* 5.94*  CALCIUM 7.7* 8.1* 8.1*  PHOS  --  6.8*  --    Liver Function Tests: Recent Labs  Lab 02/28/23 1129 03/01/23 0446 03/02/23 0409 03/03/23 1616  AST 26 21 18   --   ALT 14 15 13   --   ALKPHOS 73 73 68  --   BILITOT 0.6 0.6 0.6  --   PROT 7.0 6.4* 5.9*  --   ALBUMIN 2.5* 2.3* 2.1* 2.4*   Recent Labs  Lab 02/28/23 1129  LIPASE 40   CBC: Recent Labs  Lab 02/28/23 1129 02/28/23 1139 03/01/23 0446 03/02/23 0409 03/04/23 0512  WBC 17.2*  --  14.9* 13.3* 16.8*  NEUTROABS 12.9*  --   --   --   --   HGB 9.1*   < > 9.7* 8.1* 9.1*  HCT 29.8*   < > 31.8* 26.5* 28.9*  MCV 96.1  --  95.2 94.3 95.1  PLT 423*  --  346 310 320   < > = values in this interval not displayed.   Blood Culture    Component Value Date/Time   SDES BLOOD BLOOD RIGHT HAND 02/17/2023 2243   SPECREQUEST  02/17/2023 2243    BOTTLES DRAWN AEROBIC AND ANAEROBIC Blood Culture results may not be optimal due to an inadequate volume of blood received in culture bottles   CULT  02/17/2023 2243    NO GROWTH 5  DAYS Performed at [facility], [address], [place], [place] [address]    REPTSTATUS 02/22/2023 FINAL 02/17/2023 2243    Cardiac Enzymes: No results for input(s): "CKTOTAL", "CKMB", "CKMBINDEX", "TROPONINI" in the last 168 hours. CBG: Recent Labs  Lab 03/03/23 0817 03/03/23 1210 03/03/23 1857 03/03/23 2210 03/04/23 0937  GLUCAP 110* 85 127* 163* 152*   Iron Studies: No results for input(s): "IRON", "TIBC", "TRANSFERRIN", "FERRITIN" in the last 72 hours. @lablastinr3 @ Studies/Results: No results found. Medications:   allopurinol  100 mg Oral BID   atorvastatin  10 mg Oral QPM   cefadroxil  500 mg Oral Daily   doxycycline  100 mg Oral BID   Gerhardt's butt cream   Topical TID   heparin  5,000 Units Subcutaneous Q8H   insulin aspart  0-6 Units Subcutaneous TID WC   loperamide  2 mg Oral BID   metoprolol tartrate  50 mg Oral BID   metroNIDAZOLE  500 mg Oral Q12H   mirtazapine  15 mg Oral QHS   pantoprazole  40 mg Oral BID   saccharomyces boulardii  250 mg Oral BID   sodium chloride flush  3 mL Intravenous Q12H     OP HD: TTS [facility]  4h  400/800  112kg   3K/2.5Ca bath   TDC   Heparin none - last OP HD 12/19 left at 116 kg  - has been usually reaching dry wt - venofer 100mg  q hd thru 12/04 - mircera 100 mcg q 2 wks,    Assessment/Plan: ESKD - on HD TTS. Wasn't able to get HD yesterday, refused HD this AM. Next HD 03/05/2023 Hypokalemia: K+ 2.9 03/03/2023. Supplements given, now K+ 3.8 HTN - bp's wnl, takes metoprolol at home. Cont meds as needed.  Volume - no gross vol excess on exam she is still short of breath, improved with dialysis low, and we will plan on ultrafiltration tomorrow with dialysis as tolerated. Anemia of eskd - Hb 9.1. Give ESA with HD 03/05/2023 MBD ckd - CCa in range. Add on phos. Binders w/ meals.  Afib - not on anticoagulation because of GI bleeding. Chronic pain syndrome on narcotics PAD with rt BKA 10/23 with h/o stump dehiscence.    Amariss Detamore H. Tristian Sickinger NP-C 03/04/2023, 10:32 AM  [employer] [phone]

## 2023-03-04 NOTE — Progress Notes (Signed)
This chaplain responded to PMT NP-Alicia consult for spiritual care. The Pt. is sitting on the side of the bed, tearful and describes her pain as an "11".   The chaplain listened reflectively as the Pt. shared how her emotional and physical pain are causing her distress. "I want to be loved" is repeated often by the Pt. The chaplain understands from the Pt., the Pt. is unsuccessfully seeking love as a possible source of comfort from her family.   The chaplain explored the Pt. faith as a source of love.  The chaplain invited the Pt. to talk more about God's love in her story. The Pt. shared her belief but is unable to go any deeper into the meaning because of pain.   This chaplain updated the Pt. RN and PMT NP-Alicia on the visit. This chaplain is available for F/U spiritual care as needed.  Chaplain Stephanie Acre [phone]

## 2023-03-04 NOTE — Progress Notes (Addendum)
Palliative:  HPI: 57 y.o. female with past medical history of hypertension, diabetes, neuropathy, gastroparesis, GERD, anemia, PAF, restrictive lung disease, spinal stenosis, ESRD on HD TTS, gout, duodenal carcinoid tumor, s/p R BKA, gastric AVM, NASH, tardive dyskinesia, depression, anxiety admitted on 02/28/2023 with worsening abdominal pain and diarrhea.    Discussions with Dr. Randol Kern in person. Discussed with Dr. Dalene Carrow who agrees to manage fentanyl patch and pain medication outpatient. I met today with Deborah [name]. She continues to vacillate during conversation about what she wants. She has severe suffering and calling out "why is this happening to me?" She endorses ongoing severe pain. She talks about wanting to live but not like this. She shares that she knows her mother would not agree with decision to stop dialysis and pursue comfort care. She does not want to hurt her mother or make her sad. We discussed code status and Deborah [name] continues to desire full code status - we acknowledged that this will cause her much more pain and suffering if she is resuscitated. She continues to tell me that she does not know what to do.   I discussed symptom management. I discussed the option of fentanyl patch to better manage pain. Deborah [name] tells me that fentanyl does not help with her pain. I spent time explaining the difference in IV fentanyl and transdermal for long acting relief. We discussed that her options for long acting pain medication which is what she needs is limited due to dialysis. I encouraged that I do believe she will have much more relief than she anticipates and that transdermal fentanyl is very effective for pain management. She agrees - "I'm willing to try anything."   All questions/concerns addressed to best of my ability. Therapeutic listening provided. Emotional support provided.   Exam: Alert, oriented. Agitated and restless from pain. Also clearly with existential pain and  suffering. Breathing regular, unlabored. Abd soft. Moves all extremities. R BKA.    Plan: - Full code, full scope - Ongoing goals of care discussions - Severe pain:             - Discussed with Dr. Dalene Carrow who will manage pain outpatient and agrees to assist with fentanyl patch outpatient.  - Fentanyl patch 25 mcg/hr added 12/27.              - Continue dilaudid 4 mg po will do q6h PRN. May give dilaudid 0.5 mg IV q3h PRN breakthrough pain.              - Consider Cymbalta/gabapentin/lyrica although dosage limited by dialysis.  - Anxiety/emotional distress:  - Hydroxyzine 25 mg BID scheduled and daily PRN.   - QTc prohibitive of Zyprexa which I believe could be helpful.  - Diarrhea:  - Continue imodium as indicated although worry this could ultimately lead to constipation  - Consider using questran  80 min  Deborah Channel, NP Palliative Medicine Team Pager [phone] (Please see [email or web address] for schedule) Team Phone [phone]

## 2023-03-04 NOTE — Plan of Care (Signed)
  Problem: Skin Integrity: Goal: Risk for impaired skin integrity will decrease Outcome: Progressing   Problem: Tissue Perfusion: Goal: Adequacy of tissue perfusion will improve Outcome: Progressing   Problem: Clinical Measurements: Goal: Will remain free from infection Outcome: Progressing Goal: Respiratory complications will improve Outcome: Progressing

## 2023-03-05 DIAGNOSIS — R197 Diarrhea, unspecified: Secondary | ICD-10-CM | POA: Diagnosis not present

## 2023-03-05 LAB — RENAL FUNCTION PANEL
Albumin: 2.6 g/dL — ABNORMAL LOW (ref 3.5–5.0)
Anion gap: 15 (ref 5–15)
BUN: 29 mg/dL — ABNORMAL HIGH (ref 6–20)
CO2: 15 mmol/L — ABNORMAL LOW (ref 22–32)
Calcium: 8.7 mg/dL — ABNORMAL LOW (ref 8.9–10.3)
Chloride: 106 mmol/L (ref 98–111)
Creatinine, Ser: 7.01 mg/dL — ABNORMAL HIGH (ref 0.44–1.00)
GFR, Estimated: 6 mL/min — ABNORMAL LOW (ref >60)
Glucose, Bld: 151 mg/dL — ABNORMAL HIGH (ref 70–99)
Phosphorus: 9 mg/dL — ABNORMAL HIGH (ref 2.5–4.6)
Potassium: 3.6 mmol/L (ref 3.5–5.1)
Sodium: 136 mmol/L (ref 135–145)

## 2023-03-05 LAB — CBC
HCT: 30.9 % — ABNORMAL LOW (ref 36.0–46.0)
Hemoglobin: 9.5 g/dL — ABNORMAL LOW (ref 12.0–15.0)
MCH: 29 pg (ref 26.0–34.0)
MCHC: 30.7 g/dL (ref 30.0–36.0)
MCV: 94.2 fL (ref 80.0–100.0)
Platelets: 320 10*3/uL (ref 150–400)
RBC: 3.28 MIL/uL — ABNORMAL LOW (ref 3.87–5.11)
RDW: 15.3 % (ref 11.5–15.5)
WBC: 18.4 10*3/uL — ABNORMAL HIGH (ref 4.0–10.5)
nRBC: 0 % (ref 0.0–0.2)

## 2023-03-05 LAB — GLUCOSE, CAPILLARY
Glucose-Capillary: 132 mg/dL — ABNORMAL HIGH (ref 70–99)
Glucose-Capillary: 112 mg/dL — ABNORMAL HIGH (ref 70–99)
Glucose-Capillary: 178 mg/dL — ABNORMAL HIGH (ref 70–99)

## 2023-03-05 MED ORDER — LOPERAMIDE HCL 2 MG PO CAPS
2.0000 mg | ORAL_CAPSULE | Freq: Every day | ORAL | Status: DC
  Administered 2023-03-06: 2 mg via ORAL
  Filled 2023-03-05: qty 1

## 2023-03-05 NOTE — Progress Notes (Signed)
Subjective:  asleep in  bed  , awoken hurting all over, not in distress  "refusing hd until 2 pm ." Dw  her about refusing hd  and does she wish to stop hd ( palliative seeing ) she answers  ="continue for Now"   Objective Vital signs in last 24 hours: Vitals:   03/05/23 0630 03/05/23 0635 03/05/23 0640 03/05/23 0645  BP:      Pulse:      Resp: 20 17 17 15   Temp:      TempSrc:      SpO2:      Weight:      Height:       Weight change:   Physical Exam: General: obese , withdrawn  chronically ill female , NAD  Heart: RRR, no mrg Lungs: CTA ,nonlabored  Abdomen: Obese , nabs,soft, NT, ND Extremities: Chronic Venous  skin changes , L bka ,staples intact , trace edema , R pedal edema  trace  Dialysis Access: R internal jugular  TDC    OP HD: TTS [facility]  4h  400/800  112kg   3K/2.5Ca bath   TDC   Heparin none - last OP HD 12/19 left at 116 kg  - has been usually reaching dry wt - venofer 100mg  q hd thru 12/04 - mircera 100 mcg q 2 wks,     Problem/Plan: ESKD - on HD TTS. Wasn't able to get HD thurs 12/26,, refused HD 12/27.this AM. Next HD today  on schedule 05/05/2022 Hypokalemia: K+ 2.9 03/03/2023., K 3.6 this am  Supplements given HTN - bp's wnl, takes metoprolol at home. Cont meds as needed.  Volume - no gross vol excess on exam , this am denies sob  Anemia of eskd - Hb 9.5. Give ESA with HD 03/05/2023 MBD ckd - CCa in range.9.5 =phos. Binders w/ meals. Needs  binder compliance Intractable diarrhea =   long stand ho , c diff neg , Admit managing , actually noted  No Bm in couple days   imodium to be dc  per admit  Afib - not on anticoagulation because of GI bleeding. Chronic pain syndrome on narcotics per admit/ palliative seeing also PAD with rt BKA 10/23 with h/o stump dehiscence.    Lenny Pastel, PA-C [facility] Beeper [phone] 03/05/2023,12:13 PM  LOS: 4 days   Labs: Basic Metabolic Panel: Recent Labs  Lab 03/03/23 1616 03/04/23 0512  03/05/23 0228  NA 137 135 136  K 3.1* 3.8 3.6  CL 107 108 106  CO2 16* 13* 15*  GLUCOSE 133* 126* 151*  BUN 19 22* 29*  CREATININE 5.04* 5.94* 7.01*  CALCIUM 8.1* 8.1* 8.7*  PHOS 6.8*  --  9.0*   Liver Function Tests: Recent Labs  Lab 02/28/23 1129 03/01/23 0446 03/02/23 0409 03/03/23 1616 03/05/23 0228  AST 26 21 18   --   --   ALT 14 15 13   --   --   ALKPHOS 73 73 68  --   --   BILITOT 0.6 0.6 0.6  --   --   PROT 7.0 6.4* 5.9*  --   --   ALBUMIN 2.5* 2.3* 2.1* 2.4* 2.6*   Recent Labs  Lab 02/28/23 1129  LIPASE 40   No results for input(s): "AMMONIA" in the last 168 hours. CBC: Recent Labs  Lab 02/28/23 1129 02/28/23 1139 03/01/23 0446 03/02/23 0409 03/04/23 0512 03/05/23 0228  WBC 17.2*  --  14.9* 13.3* 16.8* 18.4*  NEUTROABS 12.9*  --   --   --   --   --  HGB 9.1*   < > 9.7* 8.1* 9.1* 9.5*  HCT 29.8*   < > 31.8* 26.5* 28.9* 30.9*  MCV 96.1  --  95.2 94.3 95.1 94.2  PLT 423*  --  346 310 320 320   < > = values in this interval not displayed.   Cardiac Enzymes: No results for input(s): "CKTOTAL", "CKMB", "CKMBINDEX", "TROPONINI" in the last 168 hours. CBG: Recent Labs  Lab 03/03/23 2210 03/04/23 0937 03/04/23 1225 03/04/23 2103 03/05/23 0719  GLUCAP 163* 152* 128* 113* 132*    Studies/Results: No results found. Medications:   allopurinol  100 mg Oral BID   atorvastatin  10 mg Oral QPM   cefadroxil  500 mg Oral Daily   darbepoetin (ARANESP) injection - DIALYSIS  25 mcg Subcutaneous Q Sat-1800   doxycycline  100 mg Oral BID   fentaNYL  1 patch Transdermal Q72H   Gerhardt's butt cream   Topical TID   heparin  5,000 Units Subcutaneous Q8H   hydrOXYzine  25 mg Oral Q12H   insulin aspart  0-6 Units Subcutaneous TID WC   [START ON 03/06/2023] loperamide  2 mg Oral Daily   metoprolol tartrate  50 mg Oral BID   metroNIDAZOLE  500 mg Oral Q12H   mirtazapine  15 mg Oral QHS   pantoprazole  40 mg Oral BID   saccharomyces boulardii  250 mg Oral  BID   sodium chloride flush  3 mL Intravenous Q12H

## 2023-03-05 NOTE — Progress Notes (Signed)
[facility] PROGRESS NOTE   Deborah [name] WUJ:[id number] DOB: 16-Apr-1965 DOA: 02/28/2023  PCP: Pcp, No  Brief History:  57 y.o. female with medical history significant for hypertension, diabetes, neuropathy, gastroparesis, GERD, anemia, paroxysmal atrial fibrillation, ESRD on HD, CHF, gout, duodenal carcinoid tumor, status post BKA on the right, history of gastric AVM, NASH, tardive dyskinesia, depression, anxiety, obesity, restrictive lung disease, spinal stenosis, chronic pain presenting with abdominal pain and diarrhea.  She has had history of same in the past and has undergone outpatient workup.  Followed by gastroenterology.  Apparently she was under consideration for somatostatin injections considering her history of carcinoid tumor.  She was hospitalized for further management.  CT scan did not show any acute findings.  Consultants: Nephrology, Palliative  Procedures: None    Subjective/Interval History:  No further diarrhea as discussed with patient and staff, had dinner tray next to her which was all finished .  Patient reports she had a good night sleep.   Assessment/Plan:  Intractable diarrhea/history of carcinoid tumor/abdominal pain - C. difficile was negative.  GI pathogen panel is negative. - Patient has had a longstanding history of diarrhea, this has much improved after started on scheduled Imodium, actually no BM for couple days now so I will decrease further to avoid constipation. -With complaint of chronic abdominal pain during previous hospitalizations, with extensive workup including, capsule endoscopy in October which did not show any active bleeding.,  upper endoscopy in October which showed esophagitis, gastritis and gastric angiectasia's., CT abdomen pelvis did not show any acute findings. -Decrease Imodium to avoid constipation  End-stage renal disease on hemodialysis/electrolyte abnormalities She is dialyzed on Tuesday Thursday Saturday  schedule.   Essential hypertension/hypotension Blood pressure remains soft, but stable continue to hold home meds  Paroxysmal atrial fibrillation Continue metoprolol.  No longer on Eliquis due to GI bleeding in 2024 October. Continue to monitor on telemetry  Normocytic anemia Likely multifactorial.  No evidence for overt bleeding.  Chronic diastolic CHF Volume being managed with hemodialysis.  Diabetes mellitus in the setting of end-stage renal disease Monitor CBGs.  Stable  Status post right BKA/wound infection Was on IV antibiotics recently.  Now on oral antibiotics.  Noted to be on cefadroxil and doxycycline and Flagyl. She still has staples in the right stump.  Has an appointment with Dr. Lajoyce Corners on 12/30.  Have discussed with Dr. Lajoyce Corners, will provide shrinker, and he will assess during hospitalization regarding further recommendations and staple removal -PT-OT consulted  History of depression and anxiety Continue home medications.  History of chronic pain syndrome/spinal stenosis Continue home medications.  Goals of care Palliative care following.  Obesity Estimated body mass index is 38.86 kg/m as calculated from the following:   Height as of this encounter: 5\' 6"  (1.676 m).   Weight as of this encounter: 109.2 kg.   DVT Prophylaxis: Subcutaneous heparin Code Status: Full code Family Communication: Discussed with patient Disposition Plan: To be determined.  PT and OT evaluation.      Medications: Scheduled:  allopurinol  100 mg Oral BID   atorvastatin  10 mg Oral QPM   cefadroxil  500 mg Oral Daily   darbepoetin (ARANESP) injection - DIALYSIS  25 mcg Subcutaneous Q Sat-1800   doxycycline  100 mg Oral BID   fentaNYL  1 patch Transdermal Q72H   Gerhardt's butt cream   Topical TID   heparin  5,000 Units Subcutaneous Q8H   hydrOXYzine  25 mg Oral Q12H   insulin aspart  0-6 Units Subcutaneous TID WC   loperamide  2 mg Oral BID   metoprolol tartrate  50 mg Oral  BID   metroNIDAZOLE  500 mg Oral Q12H   mirtazapine  15 mg Oral QHS   pantoprazole  40 mg Oral BID   saccharomyces boulardii  250 mg Oral BID   sodium chloride flush  3 mL Intravenous Q12H   Continuous:   IEP:PIRJJOACZYSAY **OR** acetaminophen, [COMPLETED]  HYDROmorphone (DILAUDID) injection **FOLLOWED BY** HYDROmorphone (DILAUDID) injection, HYDROmorphone, hydrOXYzine **AND** hydrOXYzine, methocarbamol, metoprolol tartrate, polyethylene glycol  Antibiotics: Anti-infectives (From admission, onward)    Start     Dose/Rate Route Frequency Ordered Stop   03/01/23 1000  cefadroxil (DURICEF) capsule 500 mg        500 mg Oral Daily 02/28/23 1533     02/28/23 2200  doxycycline (VIBRA-TABS) tablet 100 mg        100 mg Oral 2 times daily 02/28/23 1533     02/28/23 2200  metroNIDAZOLE (FLAGYL) tablet 500 mg        500 mg Oral Every 12 hours 02/28/23 1533         Objective:  Vital Signs  Vitals:   03/05/23 0630 03/05/23 0635 03/05/23 0640 03/05/23 0645  BP:      Pulse:      Resp: 20 17 17 15   Temp:      TempSrc:      SpO2:      Weight:      Height:       No intake or output data in the 24 hours ending 03/05/23 1104  Filed Weights   03/01/23 0451 03/01/23 1133 03/04/23 0323  Weight: 111.9 kg 109.2 kg 109.2 kg    Awake Alert, Oriented X 3, sitting comfortably, no apparent distress Symmetrical Chest wall movement, Good air movement bilaterally, CTAB RRR,No Gallops,Rubs or new Murmurs, No Parasternal Heave +ve B.Sounds, Abd Soft, No tenderness, No rebound - guarding or rigidity. Left BKA, with staples and sutures intact, no dehiscence, please see pictures below     Lab Results:  Data Reviewed: I have personally reviewed following labs and reports of the imaging studies  CBC: Recent Labs  Lab 02/28/23 1129 02/28/23 1139 03/01/23 0446 03/02/23 0409 03/04/23 0512 03/05/23 0228  WBC 17.2*  --  14.9* 13.3* 16.8* 18.4*  NEUTROABS 12.9*  --   --   --   --   --   HGB  9.1* 9.9* 9.7* 8.1* 9.1* 9.5*  HCT 29.8* 29.0* 31.8* 26.5* 28.9* 30.9*  MCV 96.1  --  95.2 94.3 95.1 94.2  PLT 423*  --  346 310 320 320    Basic Metabolic Panel: Recent Labs  Lab 03/02/23 0409 03/03/23 0421 03/03/23 1616 03/04/23 0512 03/05/23 0228  NA 137 137 137 135 136  K 2.4* 2.9* 3.1* 3.8 3.6  CL 105 106 107 108 106  CO2 20* 17* 16* 13* 15*  GLUCOSE 78 113* 133* 126* 151*  BUN 16 18 19  22* 29*  CREATININE 3.83* 4.73* 5.04* 5.94* 7.01*  CALCIUM 7.5* 7.7* 8.1* 8.1* 8.7*  PHOS  --   --  6.8*  --  9.0*    GFR: Estimated Creatinine Clearance: 11 mL/min (A) (by C-G formula based on SCr of 7.01 mg/dL (H)).  Liver Function Tests: Recent Labs  Lab 02/28/23 1129 03/01/23 0446 03/02/23 0409 03/03/23 1616 03/05/23 0228  AST 26 21 18   --   --   ALT 14 15 13   --   --  ALKPHOS 73 73 68  --   --   BILITOT 0.6 0.6 0.6  --   --   PROT 7.0 6.4* 5.9*  --   --   ALBUMIN 2.5* 2.3* 2.1* 2.4* 2.6*    Recent Labs  Lab 02/28/23 1129  LIPASE 40     CBG: Recent Labs  Lab 03/03/23 2210 03/04/23 0937 03/04/23 1225 03/04/23 2103 03/05/23 0719  GLUCAP 163* 152* 128* 113* 132*     Recent Results (from the past 240 hours)  Gastrointestinal Panel by PCR , Stool     Status: None   Collection Time: 02/28/23 11:16 AM   Specimen: Stool  Result Value Ref Range Status   Campylobacter species NOT DETECTED NOT DETECTED Final   Plesimonas shigelloides NOT DETECTED NOT DETECTED Final   Salmonella species NOT DETECTED NOT DETECTED Final   Yersinia enterocolitica NOT DETECTED NOT DETECTED Final   Vibrio species NOT DETECTED NOT DETECTED Final   Vibrio cholerae NOT DETECTED NOT DETECTED Final   Enteroaggregative E coli (EAEC) NOT DETECTED NOT DETECTED Final   Enteropathogenic E coli (EPEC) NOT DETECTED NOT DETECTED Final   Enterotoxigenic E coli (ETEC) NOT DETECTED NOT DETECTED Final   Shiga like toxin producing E coli (STEC) NOT DETECTED NOT DETECTED Final    Shigella/Enteroinvasive E coli (EIEC) NOT DETECTED NOT DETECTED Final   Cryptosporidium NOT DETECTED NOT DETECTED Final   Cyclospora cayetanensis NOT DETECTED NOT DETECTED Final   Entamoeba histolytica NOT DETECTED NOT DETECTED Final   Giardia lamblia NOT DETECTED NOT DETECTED Final   Adenovirus F40/41 NOT DETECTED NOT DETECTED Final   Astrovirus NOT DETECTED NOT DETECTED Final   Norovirus GI/GII NOT DETECTED NOT DETECTED Final   Rotavirus A NOT DETECTED NOT DETECTED Final   Sapovirus (I, II, IV, and V) NOT DETECTED NOT DETECTED Final    Comment: Performed at [facility], [address] [place], [place] [address]  C Difficile Quick Screen w PCR reflex     Status: None   Collection Time: 02/28/23 11:16 AM   Specimen: Stool  Result Value Ref Range Status   C Diff antigen NEGATIVE NEGATIVE Final   C Diff toxin NEGATIVE NEGATIVE Final   C Diff interpretation No C. difficile detected.  Final    Comment: Performed at [facility], [address], [place], [place] [address]      Radiology Studies: No results found.      LOS: 4 days   Huey Bienenstock MD  [facility] Pager on [email or web address]  03/05/2023, 11:04 AM

## 2023-03-05 NOTE — Progress Notes (Signed)
OT Cancellation Note and Discharge  Patient Details Name: Deborah [name] MRN: [id number] DOB: 1965/11/07   Cancelled Treatment:    Reason Eval/Treat Not Completed: OT screened, no needs identified, will sign off. Per when pt was last seen by OT (01/31/23) "pt reports she is relatively bed bound at baseline, her husband and PCA assists with all self care at bed level including bed pan use."  Lindon Romp. OT Acute Rehabilitation Services Office [phone]    Evette Georges 03/05/2023, 8:58 AM

## 2023-03-05 NOTE — Progress Notes (Signed)
Orthopedic Tech Progress Note Patient Details:  Deborah [name] 1965-04-24 [id number] 4XL BK Shrinker has been ordered from [facility]  Patient ID: Deborah [name], female   DOB: Jul 29, 1965, 57 y.o.   MRN: [id number]  Smitty Pluck 03/05/2023, 11:52 AM

## 2023-03-05 NOTE — Plan of Care (Signed)

## 2023-03-06 DIAGNOSIS — G6289 Other specified polyneuropathies: Secondary | ICD-10-CM | POA: Diagnosis not present

## 2023-03-06 DIAGNOSIS — D72829 Elevated white blood cell count, unspecified: Secondary | ICD-10-CM | POA: Diagnosis not present

## 2023-03-06 DIAGNOSIS — R197 Diarrhea, unspecified: Secondary | ICD-10-CM | POA: Diagnosis not present

## 2023-03-06 LAB — GLUCOSE, CAPILLARY
Glucose-Capillary: 160 mg/dL — ABNORMAL HIGH (ref 70–99)
Glucose-Capillary: 178 mg/dL — ABNORMAL HIGH (ref 70–99)
Glucose-Capillary: 146 mg/dL — ABNORMAL HIGH (ref 70–99)
Glucose-Capillary: 182 mg/dL — ABNORMAL HIGH (ref 70–99)

## 2023-03-06 MED ORDER — LACTATED RINGERS IV BOLUS
1000.0000 mL | Freq: Once | INTRAVENOUS | Status: AC
  Administered 2023-03-06: 1000 mL via INTRAVENOUS

## 2023-03-06 MED ORDER — HYDROMORPHONE HCL 1 MG/ML IJ SOLN: 0.5000 mg | Freq: Once | INTRAMUSCULAR | Status: DC

## 2023-03-06 MED ORDER — LOPERAMIDE HCL 2 MG PO CAPS
2.0000 mg | ORAL_CAPSULE | Freq: Every day | ORAL | Status: DC
  Administered 2023-03-07 – 2023-03-08 (×2): 2 mg via ORAL
  Filled 2023-03-06 (×2): qty 1

## 2023-03-06 MED ORDER — HYDROMORPHONE HCL 1 MG/ML IJ SOLN
0.5000 mg | INTRAMUSCULAR | Status: DC | PRN
  Administered 2023-03-06 – 2023-03-07 (×3): 0.5 mg via INTRAVENOUS
  Filled 2023-03-06 (×4): qty 0.5

## 2023-03-06 MED ORDER — MEDIHONEY WOUND/BURN DRESSING EX PSTE
1.0000 | PASTE | Freq: Every day | CUTANEOUS | Status: DC
Start: 2023-03-06 — End: 2023-03-09
  Administered 2023-03-06 – 2023-03-08 (×3): 1 via TOPICAL
  Filled 2023-03-06: qty 44

## 2023-03-06 NOTE — Progress Notes (Signed)
Orthopedic Tech Progress Note Patient Details:  JALECIA DO 1965-12-16 [id number]  Ortho Devices Type of Ortho Device: Prafo boot/shoe Ortho Device/Splint Location: LLE Ortho Device/Splint Interventions: Ordered   Post Interventions Instructions Provided: Care of device, Adjustment of device Patient declined prafo application, stated they would apply it themselves later. Instructions on application provided and left at bedside. Darleen Crocker 03/06/2023, 3:15 PM

## 2023-03-06 NOTE — Progress Notes (Signed)
Patient swallowed all the other pills without difficulty at once with thin liquids.  MD here to see.  Patient refused to lay on the bed for better access to heel wound.  Left heel dressing removed by MD; pictures taken by MD.  Wound cleaned with normal saline and patted dry.  Bordered dressing applied.  Orthopedic MD in see.  See new orders.

## 2023-03-06 NOTE — Plan of Care (Signed)
  Problem: Education: Goal: Ability to describe self-care measures that may prevent or decrease complications (Diabetes Survival Skills Education) will improve Outcome: Progressing   Problem: Coping: Goal: Ability to adjust to condition or change in health will improve Outcome: Progressing   Problem: Fluid Volume: Goal: Ability to maintain a balanced intake and output will improve Outcome: Progressing   Problem: Health Behavior/Discharge Planning: Goal: Ability to identify and utilize available resources and services will improve Outcome: Progressing   Problem: Metabolic: Goal: Ability to maintain appropriate glucose levels will improve Outcome: Progressing   Problem: Nutritional: Goal: Maintenance of adequate nutrition will improve Outcome: Progressing   Problem: Skin Integrity: Goal: Risk for impaired skin integrity will decrease Outcome: Progressing   

## 2023-03-06 NOTE — Progress Notes (Signed)
Subjective:  agreed to have hd late last pm   , this am no sob , just pain  symptoms "over my body '  Objective Vital signs in last 24 hours: Vitals:   03/06/23 0357 03/06/23 0412 03/06/23 0427 03/06/23 0449  BP: (!) 103/58 121/67 121/67 99/62  Pulse: (!) 101 100 100 (!) 105  Resp: 11 11 11 20   Temp:  98.4 F (36.9 C) 98.4 F (36.9 C) 98.4 F (36.9 C)  TempSrc:  Oral Oral Oral  SpO2: 100% 100% 100%   Weight:      Height:       Weight change:  Physical Exam: General: obese , withdrawn  chronically ill female , NAD  Heart: RRR, no mrg Lungs: CTA ,nonlabored  Abdomen: Obese , nabs,soft, NT, ND Extremities: Chronic Venous  skin changes , L bka ,staples intact , trace edema , R pedal edema  trace  Dialysis Access: R internal jugular  TDC     OP HD: TTS [facility]  4h  400/800  112kg   3K/2.5Ca bath   TDC   Heparin none - last OP HD 12/19 left at 116 kg  - has been usually reaching dry wt - venofer 100mg  q hd thru 12/04 - mircera 100 mcg q 2 wks,      Problem/Plan: [facility] - on HD TTS. Wasn't able to get HD thurs 12/26,, refused HD 12/27., HD late 12/28  next  Next HD  tue 12/31 in hosp if here  Hypokalemia: K+ 2.9 03/03/2023., K 3.6 this am  Supplements given HTN - bp's wnl, takes metoprolol at home. Cont meds as needed.  Volume - no gross vol excess on exam , this am denies sob  Anemia of eskd - Hb 9.5. Give ESA with HD 03/05/2023 [facility]BD ckd - CCa in range.9.5 =phos. Binders w/ meals. Needs  binder compliance Intractable diarrhea =   long stand ho , c diff neg , Admit managing , actually noted  No Bm in couple days   imodium to be dc  per admit  Afib - not on anticoagulation because of GI bleeding. Chronic pain syndrome on narcotics per admit/ palliative seeing also PAD with rt BKA 10/23 with h/o stump dehiscence.    Lenny Pastel, PA-C [facility] Beeper [phone] 03/06/2023,11:29 AM  LOS: 5 days   Labs: Basic Metabolic Panel: Recent Labs  Lab 03/03/23 1616  03/04/23 0512 03/05/23 0228  NA 137 135 136  K 3.1* 3.8 3.6  CL 107 108 106  CO2 16* 13* 15*  GLUCOSE 133* 126* 151*  BUN 19 22* 29*  CREATININE 5.04* 5.94* 7.01*  CALCIUM 8.1* 8.1* 8.7*  PHOS 6.8*  --  9.0*   Liver Function Tests: Recent Labs  Lab 02/28/23 1129 03/01/23 0446 03/02/23 0409 03/03/23 1616 03/05/23 0228  AST 26 21 18   --   --   ALT 14 15 13   --   --   ALKPHOS 73 73 68  --   --   BILITOT 0.6 0.6 0.6  --   --   PROT 7.0 6.4* 5.9*  --   --   ALBUMIN 2.5* 2.3* 2.1* 2.4* 2.6*   Recent Labs  Lab 02/28/23 1129  LIPASE 40   No results for input(s): "AMMONIA" in the last 168 hours. CBC: Recent Labs  Lab 02/28/23 1129 02/28/23 1139 03/01/23 0446 03/02/23 0409 03/04/23 0512 03/05/23 0228  WBC 17.2*  --  14.9* 13.3* 16.8* 18.4*  NEUTROABS 12.9*  --   --   --   --   --  HGB 9.1*   < > 9.7* 8.1* 9.1* 9.5*  HCT 29.8*   < > 31.8* 26.5* 28.9* 30.9*  MCV 96.1  --  95.2 94.3 95.1 94.2  PLT 423*  --  346 310 320 320   < > = values in this interval not displayed.   Cardiac Enzymes: No results for input(s): "CKTOTAL", "CKMB", "CKMBINDEX", "TROPONINI" in the last 168 hours. CBG: Recent Labs  Lab 03/04/23 2103 03/05/23 0719 03/05/23 1533 03/05/23 2039 03/06/23 0855  GLUCAP 113* 132* 112* 178* 160*    Studies/Results: No results found. Medications:   allopurinol  100 mg Oral BID   atorvastatin  10 mg Oral QPM   cefadroxil  500 mg Oral Daily   darbepoetin (ARANESP) injection - DIALYSIS  25 mcg Subcutaneous Q Sat-1800   doxycycline  100 mg Oral BID   fentaNYL  1 patch Transdermal Q72H   Gerhardt's butt cream   Topical TID   heparin  5,000 Units Subcutaneous Q8H   hydrOXYzine  25 mg Oral Q12H   insulin aspart  0-6 Units Subcutaneous TID WC   [START ON 03/07/2023] loperamide  2 mg Oral Daily   metoprolol tartrate  50 mg Oral BID   metroNIDAZOLE  500 mg Oral Q12H   mirtazapine  15 mg Oral QHS   pantoprazole  40 mg Oral BID   saccharomyces boulardii   250 mg Oral BID   sodium chloride flush  3 mL Intravenous Q12H

## 2023-03-06 NOTE — Progress Notes (Signed)
Received patient in bed to unit.  Alert and oriented.  Informed consent signed and in chart.   TX duration: 3.5 hours  Patient tolerated well.  Transported back to the room  Alert, without acute distress.  Hand-off given to patient's nurse.   Access used: catheter Access issues: none  Total UF removed: 1500 ml Medication(s) given: dilaudid 0.5 mg X2 Post HD VS: 121/67 Post HD weight: unable to obtain     03/06/23 0427  Vitals  Temp 98.4 F (36.9 C)  Temp Source Oral  BP 121/67  MAP (mmHg) 77  BP Location Right Arm  BP Method Automatic  Patient Position (if appropriate) Lying  Pulse Rate 100  Pulse Rate Source Monitor  ECG Heart Rate (!) 101  Resp 11  Oxygen Therapy  SpO2 100 %  O2 Device Room Air  Patient Activity (if Appropriate) In bed  Pulse Oximetry Type Continuous  During Treatment Monitoring  Blood Flow Rate (mL/min) 0 mL/min  Arterial Pressure (mmHg) 11.92 mmHg  Venous Pressure (mmHg) -1.01 mmHg  TMP (mmHg) -48.68 mmHg  Ultrafiltration Rate (mL/min) 489 mL/min  Dialysate Flow Rate (mL/min) 0 ml/min  Dialysate Potassium Concentration 3  Dialysate Calcium Concentration 2.5  Duration of HD Treatment -hour(s) 3.5 hour(s)  Cumulative Fluid Removed (mL) per Treatment  1500.07  Post Treatment  Dialyzer Clearance Lightly streaked  Hemodialysis Intake (mL) 0 mL  Liters Processed 67  Fluid Removed (mL) 1500 mL  Tolerated HD Treatment Yes  Post-Hemodialysis Comments HD tx completed as expected, BLR and UF goal decreased to supprt BP, tolerated well, c/o pain, medicated with dialudid 0.5 mg X2.  pt is stable.  AVG/AVF Arterial Site Held (minutes) 0 minutes  AVG/AVF Venous Site Held (minutes) 0 minutes  Note  Patient Observations pt is in bed resting  Hemodialysis Catheter Right Subclavian Double lumen Permanent (Tunneled)  Placement Date/Time: 06/24/22 1644   Serial / Lot #: [id number]  Expiration Date: 09/08/26  Time Out: Correct patient;Correct site;Correct  procedure  Maximum sterile barrier precautions: Hand hygiene;Mask;Cap;Sterile gloves;Sterile gown;Large sterile ...  Site Condition No complications  Blue Lumen Status Flushed;Heparin locked;Dead end cap in place  Red Lumen Status Flushed;Heparin locked;Dead end cap in place  Purple Lumen Status N/A  Catheter fill solution Heparin 1000 units/ml  Catheter fill volume (Arterial) 1.5 cc  Catheter fill volume (Venous) 1.5  Dressing Type Transparent  Dressing Status Antimicrobial disc in place  Drainage Description None  Post treatment catheter status Capped and Clamped

## 2023-03-06 NOTE — Progress Notes (Addendum)
Patient ID: Deborah [name], female   DOB: 04/06/1965, 57 y.o.   MRN: [id number] Patient is 4 weeks status post revision right below-knee amputation.  The incision is well-healed.  Will have sutures and staples removed today.  Patient is to wear a 4 XL stump shrinker once it is delivered to the hospital.  In the interim apply 4 x 4 gauze to the incision and an Ace wrap.  I will follow-up in the office in 2 weeks.  Examination the left foot patient has a 1 cm diameter ischemic ulcer lateral heel.  There is no surrounding cellulitis.  I will place orders for  [facility] and [facility] dressing changes daily.

## 2023-03-06 NOTE — Progress Notes (Signed)
Patient states it takes a while for her to take her medications.  Notified it's ok.  Lets get started.  Then stated she is not taking them now yet pain meds present.  Only took the pain meds and request other pill be brought back later.  Stated pain decreased to 9/10 at this time since IV meds given.  Will continue to monitor.  Alert, oriented and following commands.  Encouraged to order breakfast.  Currently sitting up on the side of the bed with  blankets around; notified she needs to stay covered up.  Also asked would she like a gown.  Patient stated no.

## 2023-03-06 NOTE — Progress Notes (Signed)
[facility] PROGRESS NOTE   Deborah [name] YHC:[id number] DOB: 1965-10-18 DOA: 02/28/2023  PCP: Pcp, No  Brief History:   57 y.o. female with medical history significant for hypertension, diabetes, neuropathy, gastroparesis, GERD, anemia, paroxysmal atrial fibrillation, ESRD on HD, CHF, gout, duodenal carcinoid tumor, status post BKA on the right, history of gastric AVM, NASH, tardive dyskinesia, depression, anxiety, obesity, restrictive lung disease, spinal stenosis, chronic pain presenting with abdominal pain and diarrhea.  She has had history of same in the past and has undergone outpatient workup.  Followed by gastroenterology.  Apparently she was under consideration for somatostatin injections considering her history of carcinoid tumor.  She was hospitalized for further management.  CT scan did not show any acute findings.  Consultants: Nephrology, Palliative  Procedures: None    Subjective/Interval History:  1 loose BM overnight, as discussed with staff appetite is good has been finishing most of her meals, she appears comfortable when I saw her this morning, but still asking for IV pain medications.    Assessment/Plan:  Intractable diarrhea/history of carcinoid tumor/abdominal pain - C. difficile was negative.  GI pathogen panel is negative. - Patient has had a longstanding history of diarrhea, this has much improved after started on scheduled Imodium, actually no BM for couple days now so I will decrease further to avoid constipation. -With complaints of chronic abdominal pain during previous hospitalizations, with extensive workup including, capsule endoscopy in October which did not show any active bleeding.,  upper endoscopy in October which showed esophagitis, gastritis and gastric angiectasia's., CT abdomen pelvis this admission did not show any acute findings. -Diarrhea has much improved, only 1 BM yesterday, will decrease Imodium to once daily, may consider  to DC in 1 to 2 days to monitor closely for constipation. -Patient was started on fentanyl patch, appears to be comfortable upon my evaluation, but still requesting IV Dilaudid regularly, I  will change frequency to every 4 hours as she has been started on fentanyl patch.   -Has leukocytosis which is chronic, she is not toxic appearing.   End-stage renal disease on hemodialysis/electrolyte abnormalities She is dialyzed on Tuesday Thursday Saturday schedule.  He was dialyzed yesterday.  Essential hypertension/hypotension Blood pressure remains soft, but stable continue to hold home meds  Paroxysmal atrial fibrillation Continue metoprolol.  No longer on Eliquis due to GI bleeding in 2024 October. Continue to monitor on telemetry  Normocytic anemia Likely multifactorial.  No evidence for overt bleeding.  Chronic diastolic CHF Volume being managed with hemodialysis.  Diabetes mellitus in the setting of end-stage renal disease Monitor CBGs.  Stable  Status post right BKA/wound infection Left heel wound infection Was on IV antibiotics recently.  Now on oral antibiotics.  Noted to be on cefadroxil and doxycycline and Flagyl.  Supposed to follow with ID as an outpatient regarding stop date will discuss with ID about when appropriate to start. - She still has staples in the right stump.  Has an appointment with Dr. Lajoyce Corners on 12/30.  He will be hospitalized by then, have discussed with Dr. Lajoyce Corners who will assess today,. -She was ordered a shrinker.  -PT-OT consulted  History of depression and anxiety Continue home medications.  History of chronic pain syndrome/spinal stenosis Continue home medications.  Goals of care Palliative care following.  Obesity Estimated body mass index is 38.86 kg/m as calculated from the following:   Height as of this encounter: 5\' 6"  (1.676 m).   Weight as of this encounter: 109.2 kg.  DVT Prophylaxis: Subcutaneous heparin Code Status: Full code Family  Communication: Discussed with patient Disposition Plan: To be determined.  PT and OT evaluation.      Medications: Scheduled:  allopurinol  100 mg Oral BID   atorvastatin  10 mg Oral QPM   cefadroxil  500 mg Oral Daily   darbepoetin (ARANESP) injection - DIALYSIS  25 mcg Subcutaneous Q Sat-1800   doxycycline  100 mg Oral BID   fentaNYL  1 patch Transdermal Q72H   Gerhardt's butt cream   Topical TID   heparin  5,000 Units Subcutaneous Q8H   hydrOXYzine  25 mg Oral Q12H   insulin aspart  0-6 Units Subcutaneous TID WC   loperamide  2 mg Oral Daily   metoprolol tartrate  50 mg Oral BID   metroNIDAZOLE  500 mg Oral Q12H   mirtazapine  15 mg Oral QHS   pantoprazole  40 mg Oral BID   saccharomyces boulardii  250 mg Oral BID   sodium chloride flush  3 mL Intravenous Q12H   Continuous:   [id number] **OR** acetaminophen, [COMPLETED]  HYDROmorphone (DILAUDID) injection **FOLLOWED BY** HYDROmorphone (DILAUDID) injection, HYDROmorphone, hydrOXYzine **AND** hydrOXYzine, methocarbamol, metoprolol tartrate, polyethylene glycol  Antibiotics: Anti-infectives (From admission, onward)    Start     Dose/Rate Route Frequency Ordered Stop   03/01/23 1000  cefadroxil (DURICEF) capsule 500 mg        500 mg Oral Daily 02/28/23 1533     02/28/23 2200  doxycycline (VIBRA-TABS) tablet 100 mg        100 mg Oral 2 times daily 02/28/23 1533     02/28/23 2200  metroNIDAZOLE (FLAGYL) tablet 500 mg        500 mg Oral Every 12 hours 02/28/23 1533         Objective:  Vital Signs  Vitals:   03/06/23 0357 03/06/23 0412 03/06/23 0427 03/06/23 0449  BP: (!) 103/58 121/67 121/67 99/62  Pulse: (!) 101 100 100 (!) 105  Resp: 11 11 11 20   Temp:  98.4 F (36.9 C) 98.4 F (36.9 C) 98.4 F (36.9 C)  TempSrc:  Oral Oral Oral  SpO2: 100% 100% 100%   Weight:      Height:        Intake/Output Summary (Last 24 hours) at 03/06/2023 1105 Last data filed at 03/06/2023 0427 Gross per 24 hour  Intake  123 ml  Output 1500 ml  Net -1377 ml    Filed Weights   03/01/23 0451 03/01/23 1133 03/04/23 0323  Weight: 111.9 kg 109.2 kg 109.2 kg    Awake Alert, Oriented X 3, comfortable, no apparent distress Symmetrical Chest wall movement, Good air movement bilaterally, CTAB RRR,No Gallops,Rubs or new Murmurs, No Parasternal Heave +ve B.Sounds, Abd Soft, No tenderness, No rebound - guarding or rigidity. Left BKA, with staples and sutures intact, no dehiscence,      Lab Results:  Data Reviewed: I have personally reviewed following labs and reports of the imaging studies  CBC: Recent Labs  Lab 02/28/23 1129 02/28/23 1139 03/01/23 0446 03/02/23 0409 03/04/23 0512 03/05/23 0228  WBC 17.2*  --  14.9* 13.3* 16.8* 18.4*  NEUTROABS 12.9*  --   --   --   --   --   HGB 9.1* 9.9* 9.7* 8.1* 9.1* 9.5*  HCT 29.8* 29.0* 31.8* 26.5* 28.9* 30.9*  MCV 96.1  --  95.2 94.3 95.1 94.2  PLT 423*  --  346 310 320 320    Basic Metabolic Panel:  Recent Labs  Lab 03/02/23 0409 03/03/23 0421 03/03/23 1616 03/04/23 0512 03/05/23 0228  NA 137 137 137 135 136  K 2.4* 2.9* 3.1* 3.8 3.6  CL 105 106 107 108 106  CO2 20* 17* 16* 13* 15*  GLUCOSE 78 113* 133* 126* 151*  BUN 16 18 19  22* 29*  CREATININE 3.83* 4.73* 5.04* 5.94* 7.01*  CALCIUM 7.5* 7.7* 8.1* 8.1* 8.7*  PHOS  --   --  6.8*  --  9.0*    GFR: Estimated Creatinine Clearance: 11 mL/min (A) (by C-G formula based on SCr of 7.01 mg/dL (H)).  Liver Function Tests: Recent Labs  Lab 02/28/23 1129 03/01/23 0446 03/02/23 0409 03/03/23 1616 03/05/23 0228  AST 26 21 18   --   --   ALT 14 15 13   --   --   ALKPHOS 73 73 68  --   --   BILITOT 0.6 0.6 0.6  --   --   PROT 7.0 6.4* 5.9*  --   --   ALBUMIN 2.5* 2.3* 2.1* 2.4* 2.6*    Recent Labs  Lab 02/28/23 1129  LIPASE 40     CBG: Recent Labs  Lab 03/04/23 2103 03/05/23 0719 03/05/23 1533 03/05/23 2039 03/06/23 0855  GLUCAP 113* 132* 112* 178* 160*     Recent Results  (from the past 240 hours)  Gastrointestinal Panel by PCR , Stool     Status: None   Collection Time: 02/28/23 11:16 AM   Specimen: Stool  Result Value Ref Range Status   Campylobacter species NOT DETECTED NOT DETECTED Final   Plesimonas shigelloides NOT DETECTED NOT DETECTED Final   Salmonella species NOT DETECTED NOT DETECTED Final   Yersinia enterocolitica NOT DETECTED NOT DETECTED Final   Vibrio species NOT DETECTED NOT DETECTED Final   Vibrio cholerae NOT DETECTED NOT DETECTED Final   Enteroaggregative E coli (EAEC) NOT DETECTED NOT DETECTED Final   Enteropathogenic E coli (EPEC) NOT DETECTED NOT DETECTED Final   Enterotoxigenic E coli (ETEC) NOT DETECTED NOT DETECTED Final   Shiga like toxin producing E coli (STEC) NOT DETECTED NOT DETECTED Final   Shigella/Enteroinvasive E coli (EIEC) NOT DETECTED NOT DETECTED Final   Cryptosporidium NOT DETECTED NOT DETECTED Final   Cyclospora cayetanensis NOT DETECTED NOT DETECTED Final   Entamoeba histolytica NOT DETECTED NOT DETECTED Final   Giardia lamblia NOT DETECTED NOT DETECTED Final   Adenovirus F40/41 NOT DETECTED NOT DETECTED Final   Astrovirus NOT DETECTED NOT DETECTED Final   Norovirus GI/GII NOT DETECTED NOT DETECTED Final   Rotavirus A NOT DETECTED NOT DETECTED Final   Sapovirus (I, II, IV, and V) NOT DETECTED NOT DETECTED Final    Comment: Performed at [facility], [address] [place], [place] [address]  C Difficile Quick Screen w PCR reflex     Status: None   Collection Time: 02/28/23 11:16 AM   Specimen: Stool  Result Value Ref Range Status   C Diff antigen NEGATIVE NEGATIVE Final   C Diff toxin NEGATIVE NEGATIVE Final   C Diff interpretation No C. difficile detected.  Final    Comment: Performed at [facility], [address], [place], [place] [address]      Radiology Studies: No results found.      LOS: 5 days   Huey Bienenstock MD  [facility] Pager on  [email or web address]  03/06/2023, 11:05 AM

## 2023-03-06 NOTE — Progress Notes (Signed)
Patient has repeatedly refused to wear a gown and had to be educated on keeping her personal body parts covered while nursing is in the room.  Also notified that visitors as well as other staff potentially could be walking down the hallway and our goal is to prevent exposure.  Patient care provided by CNA and butt cream applied to bilateral buttocks cheeks.  Patient again notified of order to remove staples and sutures and both kits now in room.  Patient request that next shift remove strips.  Secretary notified of need to order wound cleanser.  Charge nurse notified.

## 2023-03-06 NOTE — Consult Note (Signed)
WOC Nurse Consult Note: Reason for Consult: heel wound Patient with history Right BKA revision 02/02/23 per Dr. Lajoyce Corners Wound type:left heel wound Currently being followed inpatient by Dr. Lajoyce Corners, reviewed his notes and updated the orders for bedside nursing.  Offload heel at all times.    Re consult if needed, will not follow at this time. Thanks  Deborah [name] [employer], RN,CWOCN, CNS, CWON-AP [phone])

## 2023-03-07 ENCOUNTER — Encounter: Payer: 59 | Attending: Registered Nurse | Admitting: Registered Nurse

## 2023-03-07 ENCOUNTER — Encounter: Payer: 59 | Admitting: Orthopedic Surgery

## 2023-03-07 DIAGNOSIS — Z5181 Encounter for therapeutic drug level monitoring: Secondary | ICD-10-CM | POA: Insufficient documentation

## 2023-03-07 DIAGNOSIS — Z79891 Long term (current) use of opiate analgesic: Secondary | ICD-10-CM | POA: Insufficient documentation

## 2023-03-07 DIAGNOSIS — R197 Diarrhea, unspecified: Secondary | ICD-10-CM | POA: Diagnosis not present

## 2023-03-07 DIAGNOSIS — G894 Chronic pain syndrome: Secondary | ICD-10-CM | POA: Insufficient documentation

## 2023-03-07 LAB — GLUCOSE, CAPILLARY
Glucose-Capillary: 111 mg/dL — ABNORMAL HIGH (ref 70–99)
Glucose-Capillary: 97 mg/dL (ref 70–99)
Glucose-Capillary: 88 mg/dL (ref 70–99)

## 2023-03-07 MED ORDER — LACTATED RINGERS IV BOLUS
250.0000 mL | Freq: Once | INTRAVENOUS | Status: AC
Start: 2023-03-07 — End: 2023-03-07
  Administered 2023-03-07: 250 mL via INTRAVENOUS

## 2023-03-07 MED ORDER — HYDROMORPHONE HCL 2 MG PO TABS
2.0000 mg | ORAL_TABLET | Freq: Four times a day (QID) | ORAL | Status: DC | PRN
  Administered 2023-03-08 (×2): 2 mg via ORAL
  Filled 2023-03-07 (×3): qty 1

## 2023-03-07 MED ORDER — HYDROMORPHONE HCL 1 MG/ML IJ SOLN
0.2500 mg | INTRAMUSCULAR | Status: DC | PRN
  Administered 2023-03-07 – 2023-03-08 (×5): 0.25 mg via INTRAVENOUS
  Filled 2023-03-07 (×5): qty 0.5

## 2023-03-07 MED ORDER — HYDROMORPHONE HCL 1 MG/ML IJ SOLN
0.5000 mg | Freq: Once | INTRAMUSCULAR | Status: AC
  Administered 2023-03-07: 0.5 mg via INTRAVENOUS

## 2023-03-07 MED ORDER — MIDODRINE HCL 5 MG PO TABS
5.0000 mg | ORAL_TABLET | Freq: Two times a day (BID) | ORAL | Status: DC
  Administered 2023-03-07 – 2023-03-08 (×4): 5 mg via ORAL
  Filled 2023-03-07 (×4): qty 1

## 2023-03-07 MED ORDER — METOPROLOL TARTRATE 25 MG PO TABS
25.0000 mg | ORAL_TABLET | Freq: Two times a day (BID) | ORAL | Status: DC
  Administered 2023-03-07 (×2): 25 mg via ORAL
  Filled 2023-03-07 (×3): qty 1

## 2023-03-07 NOTE — TOC Progression Note (Signed)
Transition of Care [facility]) - Progression Note    Patient Details  Name: Deborah [name] MRN: [id number] Date of Birth: [birth year]  Transition of Care [facility] CM/SW Contact  Mearl Latin, LCSW Phone Number: 03/07/2023, 4:20 PM  Clinical Narrative:    CSW attempted to meet with patient but she would not awaken to voice. CSW has completed CAPS/DA and expedited PCS forms and will fax tomorrow upon discharge to see if patient would be eligible for increased services in the home. Per MD, patient to discharge home tomorrow after HD.          Expected Discharge Plan and Services                                               Social Determinants of Health (SDOH) Interventions SDOH Screenings   Food Insecurity: No Food Insecurity (02/28/2023)  Recent Concern: Food Insecurity - High Risk (01/25/2023)   Received from [facility]  Housing: High Risk (02/28/2023)  Transportation Needs: Unmet Transportation Needs (02/28/2023)  Utilities: Not At Risk (02/28/2023)  Recent Concern: Utilities - Medium Risk (01/25/2023)   Received from [facility]  Depression (PHQ2-9): High Risk (11/18/2022)  Financial Resource Strain: Low Risk (03/23/2021)   [facility] from [facility])  Physical Activity: Not on File (10/31/2017)   Received from [place], [place]  Social Connections: Unknown (07/19/2021)   Received from [facility]  Stress: Low Risk (03/23/2021)   Received from [facility])  Tobacco Use: Low Risk  (02/28/2023)    Readmission Risk Interventions    03/07/2023    4:19 PM 11/25/2022    5:16 PM 07/05/2022    1:20 PM  Readmission Risk Prevention Plan  Transportation Screening Complete Complete Complete  Medication Review (RN Care Manager) Complete Complete Referral to Pharmacy  PCP or Specialist appointment within 3-5 days of discharge Complete Complete Complete  HRI or Home Care Consult Complete  Complete Complete  SW Recovery Care/Counseling Consult Complete Complete Complete  Palliative Care Screening Complete Not Applicable Not Applicable  Skilled Nursing Facility Not Applicable Not Applicable Not Applicable

## 2023-03-07 NOTE — Progress Notes (Signed)
This chaplain responded to PMT NP-Alicia consult for creating the Pt. Advance Directive, HCPOA only.   The Pt. is sleeping at the time of the visit. The chaplain received an update from the Pt. RN and the PMT. The chaplain is available for F/U spiritual care as needed.  Chaplain Stephanie Acre [phone]

## 2023-03-07 NOTE — Progress Notes (Signed)
Dressing over left heel changed. Sutures and staples over right leg stump removed as ordered. Covered with gauze and ace wrap while waiting for her BK shrinker.

## 2023-03-07 NOTE — Progress Notes (Signed)
[facility] PROGRESS NOTE   ERNEST SAK JWJ:[id number] DOB: [birth year] DOA: 02/28/2023  PCP: Pcp, No  Brief History:   57 y.o. female with medical history significant for hypertension, diabetes, neuropathy, gastroparesis, GERD, anemia, paroxysmal atrial fibrillation, ESRD on HD, CHF, gout, duodenal carcinoid tumor, status post BKA on the right, history of gastric AVM, NASH, tardive dyskinesia, depression, anxiety, obesity, restrictive lung disease, spinal stenosis, chronic pain presenting with abdominal pain and diarrhea.  She has had history of same in the past and has undergone outpatient workup.  Followed by gastroenterology.  Apparently she was under consideration for somatostatin injections considering her history of carcinoid tumor.  She was hospitalized for further management.  CT scan did not show any acute findings.  Consultants: Nephrology, Palliative, orthopedic  Procedures: None    Subjective/Interval History:  Patient with only 1 BM overnight,  Assessment/Plan:  Intractable diarrhea/history of carcinoid tumor/abdominal pain - C. difficile was negative.  GI pathogen panel is negative. - Patient has had a longstanding history of diarrhea, this has much improved after started on scheduled Imodium, no further diarrhea, 1-2 BMs per day small amount, will decrease her Imodium further . -With complaints of chronic abdominal pain during previous hospitalizations, with extensive workup including, capsule endoscopy in October which did not show any active bleeding.,  upper endoscopy in October which showed esophagitis, gastritis and gastric angiectasia's., CT abdomen pelvis this admission did not show any acute findings. -Patient was started on fentanyl patch, will continue to taper off her IV pain medication in anticipation for discharge tomorrow. -Has leukocytosis which is chronic, she is not toxic appearing.   End-stage renal disease on hemodialysis/electrolyte  abnormalities She is dialyzed on Tuesday Thursday Saturday schedule.  For early dialysis tomorrow and DC after that.  Essential hypertension/hypotension Blood pressure remains soft, he remains on low-dose metoprolol, she was started on midodrine as well.  Paroxysmal atrial fibrillation Continue metoprolol.  No longer on Eliquis due to GI bleeding in 2024 October.  Normocytic anemia Likely multifactorial.  No evidence for overt bleeding.  Chronic diastolic CHF Volume being managed with hemodialysis.  Diabetes mellitus in the setting of end-stage renal disease Monitor CBGs.  Stable  Status post right BKA/wound infection Left heel wound infection Was on IV antibiotics recently.  Now on oral antibiotics.  Noted to be on cefadroxil and doxycycline and Flagyl.  Supposed to follow with ID as an outpatient regarding stop date will discuss with ID about when appropriate to start. - She still has staples in the right stump.  Has an appointment with Dr. Lajoyce Corners on 12/30.  Dr. Lajoyce Corners saw her yesterday, where sutures and staples were removed. . -Will reapply shrinker -He is on p.o. antibiotic regimen Flagyl Duricef and doxycycline, was supposed to follow with ID as an outpatient regarding stop date, I did discuss with ID Dr. Luciana Axe, given no signs of wound infection okay to DC antibiotics  History of depression and anxiety Continue home medications.  History of chronic pain syndrome/spinal stenosis Continue home medications.  Goals of care Palliative care following.  Obesity Estimated body mass index is 38.39 kg/m as calculated from the following:   Height as of this encounter: 5\' 6"  (1.676 m).   Weight as of this encounter: 107.9 kg.   DVT Prophylaxis: Subcutaneous heparin Code Status: Full code Family Communication: Discussed with patient Disposition Plan: To be determined.  PT and OT evaluation.      Medications: Scheduled:  allopurinol  100 mg Oral BID  atorvastatin  10 mg Oral  QPM   cefadroxil  500 mg Oral Daily   darbepoetin (ARANESP) injection - DIALYSIS  25 mcg Subcutaneous Q Sat-1800   doxycycline  100 mg Oral BID   fentaNYL  1 patch Transdermal Q72H   Gerhardt's butt cream   Topical TID   heparin  5,000 Units Subcutaneous Q8H   hydrOXYzine  25 mg Oral Q12H   insulin aspart  0-6 Units Subcutaneous TID WC   leptospermum manuka honey  1 Application Topical Daily   loperamide  2 mg Oral Daily   metoprolol tartrate  25 mg Oral BID   metroNIDAZOLE  500 mg Oral Q12H   midodrine  5 mg Oral BID WC   mirtazapine  15 mg Oral QHS   pantoprazole  40 mg Oral BID   saccharomyces boulardii  250 mg Oral BID   sodium chloride flush  3 mL Intravenous Q12H   Continuous:   [id number] **OR** acetaminophen, [COMPLETED]  HYDROmorphone (DILAUDID) injection **FOLLOWED BY** HYDROmorphone (DILAUDID) injection, HYDROmorphone, hydrOXYzine **AND** hydrOXYzine, methocarbamol, metoprolol tartrate, polyethylene glycol  Antibiotics: Anti-infectives (From admission, onward)    Start     Dose/Rate Route Frequency Ordered Stop   03/01/23 1000  cefadroxil (DURICEF) capsule 500 mg        500 mg Oral Daily 02/28/23 1533     02/28/23 2200  doxycycline (VIBRA-TABS) tablet 100 mg        100 mg Oral 2 times daily 02/28/23 1533     02/28/23 2200  metroNIDAZOLE (FLAGYL) tablet 500 mg        500 mg Oral Every 12 hours 02/28/23 1533         Objective:  Vital Signs  Vitals:   03/07/23 0500 03/07/23 0622 03/07/23 0837 03/07/23 1209  BP:  (!) 98/51 (!) 108/48 (!) 107/53  Pulse:   90 92  Resp:      Temp:   98.7 F (37.1 C) 98 F (36.7 C)  TempSrc:   Axillary Oral  SpO2:   100% 100%  Weight: 107.9 kg     Height:        Intake/Output Summary (Last 24 hours) at 03/07/2023 1232 Last data filed at 03/07/2023 0500 Gross per 24 hour  Intake 243 ml  Output --  Net 243 ml    Filed Weights   03/01/23 1133 03/04/23 0323 03/07/23 0500  Weight: 109.2 kg 109.2 kg 107.9 kg     Awake, alert, oriented x 3, was sleeping comfortably, no apparent distress, she woke up and was able to answer questions appropriately, no apparent distress Symmetrical Chest wall movement, Good air movement bilaterally, CTAB RRR,No Gallops,Rubs or new Murmurs, No Parasternal Heave +ve B.Sounds, Abd Soft, No tenderness, No rebound - guarding or rigidity. Right lower extremity status post BKA with Ace wrap      Lab Results:  Data Reviewed: I have personally reviewed following labs and reports of the imaging studies  CBC: Recent Labs  Lab 03/01/23 0446 03/02/23 0409 03/04/23 0512 03/05/23 0228  WBC 14.9* 13.3* 16.8* 18.4*  HGB 9.7* 8.1* 9.1* 9.5*  HCT 31.8* 26.5* 28.9* 30.9*  MCV 95.2 94.3 95.1 94.2  PLT 346 310 320 320    Basic Metabolic Panel: Recent Labs  Lab 03/02/23 0409 03/03/23 0421 03/03/23 1616 03/04/23 0512 03/05/23 0228  NA 137 137 137 135 136  K 2.4* 2.9* 3.1* 3.8 3.6  CL 105 106 107 108 106  CO2 20* 17* 16* 13* 15*  GLUCOSE 78 113*  133* 126* 151*  BUN 16 18 19  22* 29*  CREATININE 3.83* 4.73* 5.04* 5.94* 7.01*  CALCIUM 7.5* 7.7* 8.1* 8.1* 8.7*  PHOS  --   --  6.8*  --  9.0*    GFR: Estimated Creatinine Clearance: 10.9 mL/min (A) (by C-G formula based on SCr of 7.01 mg/dL (H)).  Liver Function Tests: Recent Labs  Lab 03/01/23 0446 03/02/23 0409 03/03/23 1616 03/05/23 0228  AST 21 18  --   --   ALT 15 13  --   --   ALKPHOS 73 68  --   --   BILITOT 0.6 0.6  --   --   PROT 6.4* 5.9*  --   --   ALBUMIN 2.3* 2.1* 2.4* 2.6*    No results for input(s): "LIPASE", "AMYLASE" in the last 168 hours.    CBG: Recent Labs  Lab 03/06/23 1340 03/06/23 1619 03/06/23 2130 03/07/23 0835 03/07/23 1207  GLUCAP 178* 146* 182* 111* 97     Recent Results (from the past 240 hours)  Gastrointestinal Panel by PCR , Stool     Status: None   Collection Time: 02/28/23 11:16 AM   Specimen: Stool  Result Value Ref Range Status   Campylobacter  species NOT DETECTED NOT DETECTED Final   Plesimonas shigelloides NOT DETECTED NOT DETECTED Final   Salmonella species NOT DETECTED NOT DETECTED Final   Yersinia enterocolitica NOT DETECTED NOT DETECTED Final   Vibrio species NOT DETECTED NOT DETECTED Final   Vibrio cholerae NOT DETECTED NOT DETECTED Final   Enteroaggregative E coli (EAEC) NOT DETECTED NOT DETECTED Final   Enteropathogenic E coli (EPEC) NOT DETECTED NOT DETECTED Final   Enterotoxigenic E coli (ETEC) NOT DETECTED NOT DETECTED Final   Shiga like toxin producing E coli (STEC) NOT DETECTED NOT DETECTED Final   Shigella/Enteroinvasive E coli (EIEC) NOT DETECTED NOT DETECTED Final   Cryptosporidium NOT DETECTED NOT DETECTED Final   Cyclospora cayetanensis NOT DETECTED NOT DETECTED Final   Entamoeba histolytica NOT DETECTED NOT DETECTED Final   Giardia lamblia NOT DETECTED NOT DETECTED Final   Adenovirus F40/41 NOT DETECTED NOT DETECTED Final   Astrovirus NOT DETECTED NOT DETECTED Final   Norovirus GI/GII NOT DETECTED NOT DETECTED Final   Rotavirus A NOT DETECTED NOT DETECTED Final   Sapovirus (I, II, IV, and V) NOT DETECTED NOT DETECTED Final    Comment: Performed at [facility], [address] [place], [place] [address]  C Difficile Quick Screen w PCR reflex     Status: None   Collection Time: 02/28/23 11:16 AM   Specimen: Stool  Result Value Ref Range Status   C Diff antigen NEGATIVE NEGATIVE Final   C Diff toxin NEGATIVE NEGATIVE Final   C Diff interpretation No C. difficile detected.  Final    Comment: Performed at [facility], [address] [place], [place] [address]      Radiology Studies: No results found.      LOS: 6 days   Huey Bienenstock MD  [facility] Pager on [email or web address]  03/07/2023, 12:32 PM

## 2023-03-07 NOTE — Progress Notes (Signed)
[facility] Progress Note   Subjective:   Seen in room, awakens to voice. Denies SOB, CP, dizziness, nausea. States "I'm fine."  Objective Vitals:   03/07/23 0413 03/07/23 0500 03/07/23 0622 03/07/23 0837  BP: (!) 104/48  (!) 98/51 (!) 108/48  Pulse: 98   90  Resp: 18     Temp: 97.9 F (36.6 C)   98.7 F (37.1 C)  TempSrc: Oral   Axillary  SpO2:    100%  Weight:  107.9 kg    Height:       Physical Exam General: Alert, chronically ill appearing female in NAD Heart: RRR, no murmurs, rubs or gallops  Lungs: CTA bilaterally, respirations unlabored on RA Abdomen: Soft, non-distended, +BS Extremities: L BKA wrapped, no edema RLE or b/l hips Dialysis Access:  R internal jugular [facility]  Additional Objective Labs: Basic Metabolic Panel: Recent Labs  Lab 03/03/23 1616 03/04/23 0512 03/05/23 0228  NA 137 135 136  K 3.1* 3.8 3.6  CL 107 108 106  CO2 16* 13* 15*  GLUCOSE 133* 126* 151*  BUN 19 22* 29*  CREATININE 5.04* 5.94* 7.01*  CALCIUM 8.1* 8.1* 8.7*  PHOS 6.8*  --  9.0*   Liver Function Tests: Recent Labs  Lab 02/28/23 1129 03/01/23 0446 03/02/23 0409 03/03/23 1616 03/05/23 0228  AST 26 21 18   --   --   ALT 14 15 13   --   --   ALKPHOS 73 73 68  --   --   BILITOT 0.6 0.6 0.6  --   --   PROT 7.0 6.4* 5.9*  --   --   ALBUMIN 2.5* 2.3* 2.1* 2.4* 2.6*   Recent Labs  Lab 02/28/23 1129  LIPASE 40   CBC: Recent Labs  Lab 02/28/23 1129 02/28/23 1139 03/01/23 0446 03/02/23 0409 03/04/23 0512 03/05/23 0228  WBC 17.2*  --  14.9* 13.3* 16.8* 18.4*  NEUTROABS 12.9*  --   --   --   --   --   HGB 9.1*   < > 9.7* 8.1* 9.1* 9.5*  HCT 29.8*   < > 31.8* 26.5* 28.9* 30.9*  MCV 96.1  --  95.2 94.3 95.1 94.2  PLT 423*  --  346 310 320 320   < > = values in this interval not displayed.   Blood Culture    Component Value Date/Time   SDES BLOOD BLOOD RIGHT HAND 02/17/2023 2243   SPECREQUEST  02/17/2023 2243    BOTTLES DRAWN AEROBIC AND ANAEROBIC  Blood Culture results may not be optimal due to an inadequate volume of blood received in culture bottles   CULT  02/17/2023 2243    NO GROWTH 5 DAYS Performed at [facility], [address], [place], [place] [address]    REPTSTATUS 02/22/2023 FINAL 02/17/2023 2243    Cardiac Enzymes: No results for input(s): "CKTOTAL", "CKMB", "CKMBINDEX", "TROPONINI" in the last 168 hours. CBG: Recent Labs  Lab 03/06/23 0855 03/06/23 1340 03/06/23 1619 03/06/23 2130 03/07/23 0835  GLUCAP 160* 178* 146* 182* 111*   Iron Studies: No results for input(s): "IRON", "TIBC", "TRANSFERRIN", "FERRITIN" in the last 72 hours. @lablastinr3 @ Studies/Results: No results found. Medications:   allopurinol  100 mg Oral BID   atorvastatin  10 mg Oral QPM   cefadroxil  500 mg Oral Daily   darbepoetin (ARANESP) injection - DIALYSIS  25 mcg Subcutaneous Q Sat-1800   doxycycline  100 mg Oral BID   fentaNYL  1 patch Transdermal  Q72H   Gerhardt's butt cream   Topical TID   heparin  5,000 Units Subcutaneous Q8H   hydrOXYzine  25 mg Oral Q12H   insulin aspart  0-6 Units Subcutaneous TID WC   leptospermum manuka honey  1 Application Topical Daily   loperamide  2 mg Oral Daily   metoprolol tartrate  25 mg Oral BID   metroNIDAZOLE  500 mg Oral Q12H   midodrine  5 mg Oral BID WC   mirtazapine  15 mg Oral QHS   pantoprazole  40 mg Oral BID   saccharomyces boulardii  250 mg Oral BID   sodium chloride flush  3 mL Intravenous Q12H    Dialysis Orders: TTS [facility]  4h  400/800  112kg   3K/2.5Ca bath   TDC   Heparin none - last OP HD 12/19 left at 116 kg  - has been usually reaching dry wt - venofer 100mg  q hd thru 12/04 - mircera 100 mcg q 2 wks,   Assessment/Plan: 1.ESKD - on HD TTS. Periodically refusing HD. Plan for next HD tomorrow per her regular schedule.  Hypokalemia: Requiring intermittent supplementation. Last K+ at goal.  HTN - BP controlled, continue metoprolol Volume - no gross vol excess on  exam , below prior EDW, will need to be lowered at discharge Anemia of eskd - Hb 9.5. Continue weekly ESA MBD ckd - Calcium controlled, phos elevated. Encourage better binder compliance  Intractable diarrhea: Improving, mgt per admitting team    Rogers Blocker, PA-C 03/07/2023, 10:28 AM  [facility] Pager: [phone]

## 2023-03-07 NOTE — Progress Notes (Signed)
Pt receives out-pt HD at [facility] on TTS. Will assist as needed.   Veleta Yamamoto Renal Navigator [phone] 

## 2023-03-07 NOTE — Progress Notes (Signed)
Orthopedic Tech Progress Note Patient Details:  Deborah [name] September 13, 1965 [id number]  RN called requesting a 4XL shrinker for patient. Spoke with [employer] and he stated patient had received a pair, but he would bring patient another pair   Patient ID: Deborah [name], female   DOB: [birth year], 57 y.o.   MRN: [id number]  [name] 03/07/2023, 1:03 PM

## 2023-03-07 NOTE — Plan of Care (Signed)
  Problem: Education: Goal: Ability to describe self-care measures that may prevent or decrease complications (Diabetes Survival Skills Education) will improve Outcome: Progressing   Problem: Coping: Goal: Ability to adjust to condition or change in health will improve Outcome: Progressing   Problem: Fluid Volume: Goal: Ability to maintain a balanced intake and output will improve Outcome: Progressing   Problem: Health Behavior/Discharge Planning: Goal: Ability to identify and utilize available resources and services will improve Outcome: Progressing   Problem: Metabolic: Goal: Ability to maintain appropriate glucose levels will improve Outcome: Progressing   Problem: Nutritional: Goal: Maintenance of adequate nutrition will improve Outcome: Progressing   Problem: Skin Integrity: Goal: Risk for impaired skin integrity will decrease Outcome: Progressing   Problem: Tissue Perfusion: Goal: Adequacy of tissue perfusion will improve Outcome: Progressing   

## 2023-03-08 ENCOUNTER — Other Ambulatory Visit (HOSPITAL_COMMUNITY): Payer: Self-pay

## 2023-03-08 DIAGNOSIS — R197 Diarrhea, unspecified: Secondary | ICD-10-CM | POA: Diagnosis not present

## 2023-03-08 DIAGNOSIS — I480 Paroxysmal atrial fibrillation: Secondary | ICD-10-CM | POA: Diagnosis not present

## 2023-03-08 LAB — GLUCOSE, CAPILLARY
Glucose-Capillary: 97 mg/dL (ref 70–99)
Glucose-Capillary: 158 mg/dL — ABNORMAL HIGH (ref 70–99)
Glucose-Capillary: 166 mg/dL — ABNORMAL HIGH (ref 70–99)

## 2023-03-08 LAB — CBC
HCT: 25.5 % — ABNORMAL LOW (ref 36.0–46.0)
Hemoglobin: 8 g/dL — ABNORMAL LOW (ref 12.0–15.0)
MCH: 29.2 pg (ref 26.0–34.0)
MCHC: 31.4 g/dL (ref 30.0–36.0)
MCV: 93.1 fL (ref 80.0–100.0)
Platelets: 274 10*3/uL (ref 150–400)
RBC: 2.74 MIL/uL — ABNORMAL LOW (ref 3.87–5.11)
RDW: 15.6 % — ABNORMAL HIGH (ref 11.5–15.5)
WBC: 18.1 10*3/uL — ABNORMAL HIGH (ref 4.0–10.5)
nRBC: 0 % (ref 0.0–0.2)

## 2023-03-08 LAB — RENAL FUNCTION PANEL
Albumin: 2.1 g/dL — ABNORMAL LOW (ref 3.5–5.0)
Anion gap: 14 (ref 5–15)
BUN: 43 mg/dL — ABNORMAL HIGH (ref 6–20)
CO2: 19 mmol/L — ABNORMAL LOW (ref 22–32)
Calcium: 8 mg/dL — ABNORMAL LOW (ref 8.9–10.3)
Chloride: 100 mmol/L (ref 98–111)
Creatinine, Ser: 6.62 mg/dL — ABNORMAL HIGH (ref 0.44–1.00)
GFR, Estimated: 7 mL/min — ABNORMAL LOW (ref >60)
Glucose, Bld: 169 mg/dL — ABNORMAL HIGH (ref 70–99)
Phosphorus: 8.3 mg/dL — ABNORMAL HIGH (ref 2.5–4.6)
Potassium: 2.8 mmol/L — ABNORMAL LOW (ref 3.5–5.1)
Sodium: 133 mmol/L — ABNORMAL LOW (ref 135–145)

## 2023-03-08 MED ORDER — METOPROLOL TARTRATE 25 MG PO TABS
25.0000 mg | ORAL_TABLET | Freq: Two times a day (BID) | ORAL | 0 refills | Status: DC
  Filled 2023-03-08: qty 60, 30d supply, fill #0

## 2023-03-08 MED ORDER — FENTANYL 25 MCG/HR TD PT72
1.0000 | MEDICATED_PATCH | TRANSDERMAL | 0 refills | Status: DC
  Filled 2023-03-08: qty 5, 15d supply, fill #0

## 2023-03-08 MED ORDER — LOPERAMIDE HCL 2 MG PO CAPS
2.0000 mg | ORAL_CAPSULE | ORAL | 0 refills | Status: DC | PRN
  Filled 2023-03-08: qty 14, 10d supply, fill #0

## 2023-03-08 MED ORDER — ACIDOPHILUS PO CAPS
1.0000 | ORAL_CAPSULE | Freq: Three times a day (TID) | ORAL | 0 refills | Status: DC
  Filled 2023-03-08: qty 100, 34d supply, fill #0

## 2023-03-08 MED ORDER — HEPARIN SODIUM (PORCINE) 1000 UNIT/ML IJ SOLN
INTRAMUSCULAR | Status: AC
  Administered 2023-03-08: 3800 [IU]
  Filled 2023-03-08: qty 4

## 2023-03-08 MED ORDER — MEDIHONEY WOUND/BURN DRESSING EX PSTE
1.0000 | PASTE | Freq: Every day | CUTANEOUS | 0 refills | Status: DC
  Filled 2023-03-08: qty 44, 44d supply, fill #0

## 2023-03-08 MED ORDER — HYDROMORPHONE HCL 2 MG PO TABS
2.0000 mg | ORAL_TABLET | Freq: Four times a day (QID) | ORAL | 0 refills | Status: DC | PRN
  Filled 2023-03-08: qty 15, 4d supply, fill #0

## 2023-03-08 NOTE — Progress Notes (Signed)
   03/08/23 1441  During Treatment Monitoring  Intra-Hemodialysis Comments  ([facility] notified of early termination of tx. early tx termination paper signed)

## 2023-03-08 NOTE — Plan of Care (Signed)
  Problem: Nutritional: Goal: Maintenance of adequate nutrition will improve Outcome: Progressing   Problem: Skin Integrity: Goal: Risk for impaired skin integrity will decrease Outcome: Progressing   Problem: Education: Goal: Knowledge of General Education information will improve Description: Including pain rating scale, medication(s)/side effects and non-pharmacologic comfort measures Outcome: Progressing   Problem: Clinical Measurements: Goal: Will remain free from infection Outcome: Progressing Goal: Diagnostic test results will improve Outcome: Progressing   Problem: Activity: Goal: Risk for activity intolerance will decrease Outcome: Progressing   Problem: Coping: Goal: Level of anxiety will decrease Outcome: Progressing

## 2023-03-08 NOTE — TOC Progression Note (Addendum)
 Transition of Care [facility]) - Progression Note    Patient Details  Name: Deborah [name] MRN: [id number] Date of Birth: Oct 09, 1965  Transition of Care [facility]) CM/SW Contact  Deborah GORMAN Kindle, LCSW Phone Number: 03/08/2023, 11:59 AM  Clinical Narrative:    11:59 AM-CSW faxed CAP-DA referral form to [phone] and PCS form to [phone].  CSW received transportation assistance request as patient stated her Medicaid runs out today. Patient will need to contact Medicaid and renew her status. CSW currently on hold with Medicaid Transportation and will obtain info as time allows. CSW requested private pay quote from [facility] and they will call CSW back with quote. CSW contacted [employer] transport and they stated it would be $110 each way.  12:42 PM-CSW spoke with Medicaid Transport and she confirmed that patient typically has to call [facility] for her Medicaid rides [phone]). She stated her Medicaid transport does show that it will expire today and that her Medicaid case worker has been leaving her voicemails trying to reach her. CSW left voicemail for case worker, Deborah [name] [phone]) to see what needs to be done.   CSW submitted [employer] [place] application online for patient. CSW does not have any other resources to offer at this time so patient will need to rely on her family to assist until she can contact Medicaid to re-certify.   CSW met with patient and informed her of the above info and provided her case worker's contact number along with private pay transport companies. Patient tearful and reported she does not have help at home. CSW encouraged patient to let her mom assist her because she wants to but patient stated she does not like to involved her. CSW told patient that if she continues to not want to return home and not have the transport to dialysis then patient may need to consider long term care SNF placement with her Medicaid check. Patient stated she does not want  to do that. CSW provided SNF resources if patient changes her mind. Patient to go home after dialysis around 7pm when her spouse comes home. She refused for CSW to contact her mother.    5:23 PM-CSW received request from RN that patient's spouse is home and would like to be transported earlier. CSW contacted [facility] but they have 10 in front of patient. RN updated.         Expected Discharge Plan and Services         Expected Discharge Date: 03/08/23                                     Social Determinants of Health (SDOH) Interventions SDOH Screenings   Food Insecurity: No Food Insecurity (02/28/2023)  Recent Concern: Food Insecurity - High Risk (01/25/2023)   Received from [facility]  Housing: High Risk (02/28/2023)  Transportation Needs: Unmet Transportation Needs (02/28/2023)  Utilities: Not At Risk (02/28/2023)  Recent Concern: Utilities - Medium Risk (01/25/2023)   Received from [facility]  Depression (PHQ2-9): High Risk (11/18/2022)  Financial Resource Strain: Low Risk (03/23/2021)   [facility]  Physical Activity: Not on File (10/31/2017)   Received from [place] [place]  Social Connections: Unknown (07/19/2021)   Received from [facility]  Stress: Low Risk (03/23/2021)   [facility] from [facility])  Tobacco Use: Low Risk  (02/28/2023)    Readmission  Risk Interventions    03/07/2023    4:19 PM 11/25/2022    5:16 PM 07/05/2022    1:20 PM  Readmission Risk Prevention Plan  Transportation Screening Complete Complete Complete  Medication Review (RN Care Manager) Complete Complete Referral to Pharmacy  PCP or Specialist appointment within 3-5 days of discharge Complete Complete Complete  HRI or Home Care Consult Complete Complete Complete  SW Recovery Care/Counseling Consult Complete Complete Complete  Palliative Care Screening Complete Not Applicable Not  Applicable  Skilled Nursing Facility Not Applicable Not Applicable Not Applicable

## 2023-03-08 NOTE — Plan of Care (Signed)
  Problem: Education: Goal: Ability to describe self-care measures that may prevent or decrease complications (Diabetes Survival Skills Education) will improve Outcome: Adequate for Discharge Goal: Individualized Educational Video(s) Outcome: Adequate for Discharge   Problem: Coping: Goal: Ability to adjust to condition or change in health will improve Outcome: Adequate for Discharge   Problem: Fluid Volume: Goal: Ability to maintain a balanced intake and output will improve Outcome: Adequate for Discharge   Problem: Health Behavior/Discharge Planning: Goal: Ability to identify and utilize available resources and services will improve Outcome: Adequate for Discharge Goal: Ability to manage health-related needs will improve Outcome: Adequate for Discharge   Problem: Metabolic: Goal: Ability to maintain appropriate glucose levels will improve Outcome: Adequate for Discharge   Problem: Nutritional: Goal: Maintenance of adequate nutrition will improve Outcome: Adequate for Discharge Goal: Progress toward achieving an optimal weight will improve Outcome: Adequate for Discharge   Problem: Skin Integrity: Goal: Risk for impaired skin integrity will decrease Outcome: Adequate for Discharge   Problem: Tissue Perfusion: Goal: Adequacy of tissue perfusion will improve Outcome: Adequate for Discharge   Problem: Education: Goal: Knowledge of General Education information will improve Description: Including pain rating scale, medication(s)/side effects and non-pharmacologic comfort measures Outcome: Adequate for Discharge   Problem: Health Behavior/Discharge Planning: Goal: Ability to manage health-related needs will improve Outcome: Adequate for Discharge   Problem: Clinical Measurements: Goal: Ability to maintain clinical measurements within normal limits will improve Outcome: Adequate for Discharge Goal: Will remain free from infection Outcome: Adequate for Discharge Goal:  Diagnostic test results will improve Outcome: Adequate for Discharge Goal: Respiratory complications will improve Outcome: Adequate for Discharge Goal: Cardiovascular complication will be avoided Outcome: Adequate for Discharge   Problem: Activity: Goal: Risk for activity intolerance will decrease Outcome: Adequate for Discharge   Problem: Nutrition: Goal: Adequate nutrition will be maintained Outcome: Adequate for Discharge   Problem: Coping: Goal: Level of anxiety will decrease Outcome: Adequate for Discharge   Problem: Elimination: Goal: Will not experience complications related to bowel motility Outcome: Adequate for Discharge Goal: Will not experience complications related to urinary retention Outcome: Adequate for Discharge   Problem: Pain Management: Goal: General experience of comfort will improve Outcome: Adequate for Discharge   Problem: Safety: Goal: Ability to remain free from injury will improve Outcome: Adequate for Discharge   Problem: Skin Integrity: Goal: Risk for impaired skin integrity will decrease Outcome: Adequate for Discharge

## 2023-03-08 NOTE — Progress Notes (Signed)
 [facility] Progress Note   Subjective:   Pt seen in room, reports legs hurt. Denies SOB, CP, dizziness, nausea. Reports she was told she may go home today but she is concerned because her mom is OOT.   Objective Vitals:   03/08/23 0047 03/08/23 0439 03/08/23 0741 03/08/23 0744  BP: (!) 108/47 (!) 95/44 (!) 106/52   Pulse: 80 86 68 68  Resp: 17 18    Temp: 98 F (36.7 C) 98.2 F (36.8 C) 98.1 F (36.7 C)   TempSrc: Oral Oral Oral Oral  SpO2:  95%    Weight:      Height:       Physical Exam General: Alert, chronically ill appearing female in NAD.  Heart: RRR, no murmurs, rubs or gallops Lungs: CTA bilaterally, respirations unlabored on RA Abdomen: Soft, non-distended, +BS Extremities: L BKA wrapped, no edema RLE Dialysis Access:  R internal jugular [facility]  Additional Objective Labs: Basic Metabolic Panel: Recent Labs  Lab 03/03/23 1616 03/04/23 0512 03/05/23 0228  NA 137 135 136  K 3.1* 3.8 3.6  CL 107 108 106  CO2 16* 13* 15*  GLUCOSE 133* 126* 151*  BUN 19 22* 29*  CREATININE 5.04* 5.94* 7.01*  CALCIUM  8.1* 8.1* 8.7*  PHOS 6.8*  --  9.0*   Liver Function Tests: Recent Labs  Lab 03/02/23 0409 03/03/23 1616 03/05/23 0228  AST 18  --   --   ALT 13  --   --   ALKPHOS 68  --   --   BILITOT 0.6  --   --   PROT 5.9*  --   --   ALBUMIN  2.1* 2.4* 2.6*   No results for input(s): LIPASE, AMYLASE in the last 168 hours. CBC: Recent Labs  Lab 03/02/23 0409 03/04/23 0512 03/05/23 0228  WBC 13.3* 16.8* 18.4*  HGB 8.1* 9.1* 9.5*  HCT 26.5* 28.9* 30.9*  MCV 94.3 95.1 94.2  PLT 310 320 320   Blood Culture    Component Value Date/Time   SDES BLOOD BLOOD RIGHT HAND 02/17/2023 2243   SPECREQUEST  02/17/2023 2243    BOTTLES DRAWN AEROBIC AND ANAEROBIC Blood Culture results may not be optimal due to an inadequate volume of blood received in culture bottles   CULT  02/17/2023 2243    NO GROWTH 5 DAYS Performed at [facility], [address] [place], [place] [address]    REPTSTATUS 02/22/2023 FINAL 02/17/2023 2243    Cardiac Enzymes: No results for input(s): CKTOTAL, CKMB, CKMBINDEX, TROPONINI in the last 168 hours. CBG: Recent Labs  Lab 03/06/23 2130 03/07/23 0835 03/07/23 1207 03/07/23 1625 03/08/23 0750  GLUCAP 182* 111* 97 88 97   Iron  Studies: No results for input(s): IRON , TIBC, TRANSFERRIN, FERRITIN in the last 72 hours. @lablastinr3 @ Studies/Results: No results found. Medications:   allopurinol   100 mg Oral BID   atorvastatin   10 mg Oral QPM   cefadroxil   500 mg Oral Daily   darbepoetin (ARANESP ) injection - DIALYSIS  25 mcg Subcutaneous Q Sat-1800   doxycycline   100 mg Oral BID   fentaNYL   1 patch Transdermal Q72H   Gerhardt's butt cream   Topical TID   heparin   5,000 Units Subcutaneous Q8H   hydrOXYzine   25 mg Oral Q12H   insulin  aspart  0-6 Units Subcutaneous TID WC   leptospermum manuka honey  1 Application Topical Daily   loperamide   2 mg Oral Daily   metoprolol  tartrate  25 mg Oral  BID   metroNIDAZOLE   500 mg Oral Q12H   midodrine   5 mg Oral BID WC   mirtazapine   15 mg Oral QHS   pantoprazole   40 mg Oral BID   saccharomyces boulardii  250 mg Oral BID   sodium chloride  flush  3 mL Intravenous Q12H    Outpatient Dialysis Orders: TTS [facility]  4h  400/800  112kg   3K/2.5Ca bath   TDC   Heparin  none - last OP HD 12/19 left at 116 kg  - has been usually reaching dry wt - venofer  100mg  q hd thru 12/04 - mircera 100 mcg q 2 wks,   Assessment/Plan: 1.ESKD - on HD TTS. Periodically refusing HD. Plan for next HD today per her regular schedule.  Hypokalemia: Requiring intermittent supplementation. Last K+ at goal.  HTN - BP controlled, continue metoprolol  Volume - no gross vol excess on exam , below prior EDW, will need to be lowered at discharge Anemia of eskd - Hb 9.5. Continue weekly ESA MBD ckd - Calcium  controlled, phos elevated. Encourage better binder compliance   Intractable diarrhea: Improving, mgt per admitting team    Lucie Collet, PA-C 03/08/2023, 10:09 AM  [facility] Pager: [phone]

## 2023-03-08 NOTE — Progress Notes (Signed)
D/C order noted. Contacted [facility] to be advised pt is to d/c today and should resume care on Thursday.   Olivia Canter Renal Navigator [phone]

## 2023-03-08 NOTE — Discharge Summary (Signed)
 Physician Discharge Summary  Deborah [name] FMW:[id number] DOB: 1965-08-11 DOA: 02/28/2023  PCP: Freddrick, No  Admit date: 02/28/2023 Discharge date: 03/08/2023  Admitted From: (Home) Disposition:  (Home)  Recommendations for Outpatient Follow-up:  Follow up with PCP in 1-2 weeks Please obtain BMP/CBC in one week  Diet recommendation: Renal / Carb Modified  Brief/Interim Summary:  57 y.o. female with medical history significant for hypertension, diabetes, neuropathy, gastroparesis, GERD, anemia, paroxysmal atrial fibrillation, ESRD on HD, CHF, gout, duodenal carcinoid tumor, status post BKA on the right, history of gastric AVM, NASH, tardive dyskinesia, depression, anxiety, obesity, restrictive lung disease, spinal stenosis, chronic pain presenting with abdominal pain and diarrhea.  She has had history of same in the past and has undergone outpatient workup.  Followed by gastroenterology.  Apparently she was under consideration for somatostatin injections considering her history of carcinoid tumor.  She was hospitalized for further management.  CT scan did not show any acute findings.   Intractable diarrhea/history of carcinoid tumor/abdominal pain - C. difficile was negative.  GI pathogen panel is negative. - Patient has had a longstanding history of diarrhea, this has much improved after started on scheduled Imodium , he has been no further diarrhea over last few days, where her Imodium  has been decreased to once daily, she is having average 1 small BM per day as discussed with staff, so we will change Imodium  to as needed on discharge . -With complaints of chronic abdominal pain during different previous hospitalizations, with extensive workup including, capsule endoscopy in October which did not show any active bleeding.,  upper endoscopy in October which showed esophagitis, gastritis and gastric angiectasia's., CT abdomen pelvis this admission did not show any acute findings. -Was on as  needed Dilaudid  during hospital stay, both IV and p.o., she kept requesting her IV despite being comfortable, always refused p.o. alternative, she was started on fentanyl  patch, she will be discharged on low-dose p.o. Dilaudid  as needed.   -Has leukocytosis which is chronic, she is not toxic appearing.   End-stage renal disease on hemodialysis/electrolyte abnormalities She is dialyzed on Tuesday Thursday Saturday schedule.  Will be dialyzed today before discharge   Essential hypertension/hypotension Blood pressure remains soft, continue with midodrine , she remains on low-dose metoprolol  for A-fib   Paroxysmal atrial fibrillation Continue metoprolol .  No longer on Eliquis  due to GI bleeding in 2024 October.   Anemia of chronic kidney disease Likely multifactorial.  No evidence for overt bleeding.  Continue with weekly Procrit   Chronic diastolic CHF Volume being managed with hemodialysis.  Continue with home dose torsemide    Diabetes mellitus in the setting of end-stage renal disease CBG has been stable on sliding scale only, so we will hold long-acting insulin  and continue sliding scale.   Status post right BKA/wound infection Left heel wound infection Was on IV antibiotics recently.  Now on oral antibiotics.  Noted to be on cefadroxil  and doxycycline  and Flagyl .  Supposed to follow with ID as an outpatient regarding stop date will discuss with ID about when appropriate to stop,  I did discuss with ID Dr. Efrain, given no signs of wound infection okay to DC antibiotics.(MRI has been negative for osteomyelitis during last admission) - She still has staples in the right stump.  Has an appointment with Dr. Harden on 12/30.  Dr. Harden saw her 12/29, where sutures and staples were removed. . -Reapplied shrinker   History of depression and anxiety Continue home medications.   History of chronic pain syndrome/spinal stenosis Please  see above discussion regarding pain medications   Goals of  care Palliative care following.   Obesity Estimated body mass index is 38.39 kg/m as calculated from the following:   Height as of this encounter: 5' 6 (1.676 m).   Weight as of this encounter: 107.9 kg.    Discharge Diagnoses:  Principal Problem:   Intractable diarrhea Active Problems:   Chronic hypokalemia   Peripheral neuropathy   Leukocytosis   PAF (paroxysmal atrial fibrillation) (HCC)   Normocytic anemia   GERD (gastroesophageal reflux disease)   Morbid obesity (HCC)   Essential hypertension   Insulin  dependent type 2 diabetes mellitus (HCC)   Diabetic gastroparesis (HCC)   Depression   Anxiety   Chronic pain   CHF (congestive heart failure) (HCC)   End stage renal disease on dialysis (HCC)   (HFpEF) heart failure with preserved ejection fraction (HCC)   Tardive dyskinesia   Right below-knee amputee (HCC)   Diarrhea    Discharge Instructions  Discharge Instructions     Diet - low sodium heart healthy   Complete by: As directed    Discharge instructions   Complete by: As directed    Follow with Primary MD  Get CBC, CMP,  checked  by Primary MD next visit.   Disposition Home    Diet: Renal Diet   On your next visit with your primary care physician please Get Medicines reviewed and adjusted.   Please request your Prim.MD to go over all Hospital Tests and Procedure/Radiological results at the follow up, please get all Hospital records sent to your Prim MD by signing hospital release before you go home.   If you experience worsening of your admission symptoms, develop shortness of breath, life threatening emergency, suicidal or homicidal thoughts you must seek medical attention immediately by calling 911 or calling your MD immediately  if symptoms less severe.  You Must read complete instructions/literature along with all the possible adverse reactions/side effects for all the Medicines you take and that have been prescribed to you. Take any new  Medicines after you have completely understood and accpet all the possible adverse reactions/side effects.   Do not drive, operating heavy machinery, perform activities at heights, swimming or participation in water activities or provide baby sitting services if your were admitted for syncope or siezures until you have seen by Primary MD or a Neurologist and advised to do so again.  Do not drive when taking Pain medications.    Do not take more than prescribed Pain, Sleep and Anxiety Medications  Special Instructions: If you have smoked or chewed Tobacco  in the last 2 yrs please stop smoking, stop any regular Alcohol  and or any Recreational drug use.  Wear Seat belts while driving.   Please note  You were cared for by a hospitalist during your hospital stay. If you have any questions about your discharge medications or the care you received while you were in the hospital after you are discharged, you can call the unit and asked to speak with the hospitalist on call if the hospitalist that took care of you is not available. Once you are discharged, your primary care physician will handle any further medical issues. Please note that NO REFILLS for any discharge medications will be authorized once you are discharged, as it is imperative that you return to your primary care physician (or establish a relationship with a primary care physician if you do not have one) for your aftercare needs so that  they can reassess your need for medications and monitor your lab values.   Discharge wound care:   Complete by: As directed    Apply Medihoney to the left heel wound daily, top with dry dressing. Wrap with kerlix.  Change daily   Increase activity slowly   Complete by: As directed       Allergies as of 03/08/2023       Reactions   Isovue  [iopamidol ] Anaphylaxis, [name], Other (See Comments)   11/28/17 Patient had seizure like activity and then 1 min code after 100 cc of isovue  300.  Possible contrast allergy vs vasovagal episode Cardiac Arrest   Nsaids Anaphylaxis, Other (See Comments)   Hx of stomach ulcers   Penicillins Itching, Palpitations, Other (See Comments)   Flushing (Red Skin) Laryngeal Edema   Reglan  [metoclopramide ] Other (See Comments)   Tardive dyskinesia   Valium  [diazepam ] [name]   Zestril [lisinopril] Anaphylaxis, Swelling   Tongue and mouth swelling Laryngeal Edema   Tolectin [tolmetin] Nausea And Vomiting, Nausea Only, Other (See Comments)   Irritates stomach ulcer   Asa [aspirin ] Other (See Comments)   Hx of stomach ulcer   Aspartame And Phenylalanine Hives   Bentyl  [dicyclomine ] Other (See Comments)   Chest pain   Hibiclens  [chlorhexidine  Gluconate] Other (See Comments)   Dermatitis    Flexeril  [cyclobenzaprine ] Palpitations   Oxycontin  [oxycodone ] Palpitations   Rifamycins Palpitations   Tylenol  [acetaminophen ] Nausea And Vomiting, Nausea Only, Other (See Comments)   Irritates stomach ulcer Abdominal pain   Ultram  [tramadol ] Nausea And Vomiting, Palpitations        Medication List     STOP taking these medications    carvedilol  12.5 MG tablet Commonly known as: COREG    cefadroxil  500 MG capsule Commonly known as: DURICEF   cefTAZidime  0.5 g in dextrose  5 % 50 mL   colchicine  0.6 MG tablet   doxycycline  100 MG tablet Commonly known as: VIBRA -TABS   Eliquis  5 MG Tabs tablet Generic drug: apixaban    hydrALAZINE  50 MG tablet Commonly known as: APRESOLINE    isosorbide  mononitrate 60 MG 24 hr tablet Commonly known as: IMDUR    metroNIDAZOLE  500 MG tablet Commonly known as: FLAGYL    Semglee  (yfgn) 100 UNIT/ML Pen Generic drug: insulin  glargine   vancomycin  1-5 GM/200ML-% Soln Commonly known as: VANCOCIN        TAKE these medications    (feeding supplement) PROSource Plus liquid Take 30 mLs by mouth 2 (two) times daily between meals.   nutrition supplement (JUVEN) Pack Take 1 packet by mouth 2  (two) times daily between meals.   albuterol  (2.5 MG/3ML) 0.083% nebulizer solution Commonly known as: PROVENTIL  Take 3 mLs (2.5 mg total) by nebulization every 6 (six) hours as needed for wheezing or shortness of breath.   allopurinol  100 MG tablet Commonly known as: ZYLOPRIM  Take 1 tablet (100 mg total) by mouth 2 (two) times daily.   ascorbic acid  500 MG tablet Commonly known as: VITAMIN C  Take 500 mg by mouth daily.   atorvastatin  10 MG tablet Commonly known as: LIPITOR Take 10 mg by mouth every evening.   busPIRone  5 MG tablet Commonly known as: BUSPAR  Take 5 mg by mouth 3 (three) times daily.   cetirizine  10 MG tablet Commonly known as: ZYRTEC  Take 10 mg by mouth daily.   famotidine  20 MG tablet Commonly known as: PEPCID  Take 20 mg by mouth daily before breakfast.   fentaNYL  25 MCG/HR Commonly known as: DURAGESIC  Place 1 patch  onto the skin every 3 (three) days. Start taking on: March 10, 2023   ferric citrate  1 GM 210 MG(Fe) tablet Commonly known as: AURYXIA  Take 2 tablets (420 mg total) by mouth 3 (three) times daily with meals. What changed: how much to take   fluticasone  50 MCG/ACT nasal spray Commonly known as: FLONASE  Place 2 sprays into both nostrils daily as needed for allergies or rhinitis.   folic acid  1 MG tablet Commonly known as: FOLVITE  Take 1 tablet (1 mg total) by mouth daily.   gabapentin  100 MG capsule Commonly known as: NEURONTIN  Take 1 capsule (100 mg total) by mouth 3 (three) times daily.   HYDROmorphone  2 MG tablet Commonly known as: DILAUDID  Take 1 tablet (2 mg total) by mouth every 6 (six) hours as needed for severe pain (pain score 7-10) or moderate pain (pain score 4-6). What changed:  medication strength how much to take when to take this reasons to take this additional instructions   hydrOXYzine  25 MG tablet Commonly known as: ATARAX  Take 1 tablet (25 mg total) by mouth 3 (three) times daily as needed for  anxiety. What changed:  when to take this additional instructions   insulin  lispro 100 UNIT/ML KwikPen Commonly known as: HUMALOG  Before each meal 3 times a day, 140-199 - 2 units, 200-250 - 6 units, 251-299 - 8 units,  300-349 - 12 units,  350 or above 14 units. What changed:  how much to take how to take this when to take this additional instructions   leptospermum manuka honey Pste paste Apply 1 Application topically daily.   loperamide  2 MG capsule Commonly known as: IMODIUM  Take 1 capsule (2 mg total) by mouth as needed for diarrhea or loose stools. What changed: when to take this   methocarbamol  500 MG tablet Commonly known as: ROBAXIN  Take 1 tablet (500 mg total) by mouth every 6 (six) hours as needed for muscle spasms.   metoprolol  tartrate 25 MG tablet Commonly known as: LOPRESSOR  Take 1 tablet (25 mg total) by mouth 2 (two) times daily. What changed:  medication strength how much to take   midodrine  5 MG tablet Commonly known as: PROAMATINE  Take 5 mg by mouth 3 (three) times daily.   mirtazapine  15 MG tablet Commonly known as: REMERON  Take 15 mg by mouth at bedtime.   pantoprazole  40 MG tablet Commonly known as: PROTONIX  Take 1 tablet (40 mg total) by mouth 2 (two) times daily.   saccharomyces boulardii 250 MG capsule Commonly known as: FLORASTOR Take 1 capsule (250 mg total) by mouth 2 (two) times daily.   SENIOR MULTIVITAMIN PLUS PO Take 1 tablet by mouth daily.   torsemide  100 MG tablet Commonly known as: DEMADEX  Take 50 mg by mouth 2 (two) times daily.               Discharge Care Instructions  (From admission, onward)           Start     Ordered   03/08/23 0000  Discharge wound care:       Comments: Apply Medihoney to the left heel wound daily, top with dry dressing. Wrap with kerlix.  Change daily   03/08/23 1139            Follow-up Information     Harden Jerona GAILS, MD Follow up in 2 week(s).   Specialty: Orthopedic  Surgery Contact information: [address] [place] [address] [phone]  Allergies  Allergen Reactions   Isovue  [Iopamidol ] Anaphylaxis, [name] and Other (See Comments)    11/28/17 Patient had seizure like activity and then 1 min code after 100 cc of isovue  300. Possible contrast allergy vs vasovagal episode  Cardiac Arrest   Nsaids Anaphylaxis and Other (See Comments)    Hx of stomach ulcers   Penicillins Itching, Palpitations and Other (See Comments)    Flushing (Red Skin) Laryngeal Edema   Reglan  [Metoclopramide ] Other (See Comments)    Tardive dyskinesia    Valium  [Diazepam ] [name]   Zestril [Lisinopril] Anaphylaxis and Swelling    Tongue and mouth swelling Laryngeal Edema   Tolectin [Tolmetin] Nausea And Vomiting, Nausea Only and Other (See Comments)    Irritates stomach ulcer   Asa [Aspirin ] Other (See Comments)    Hx of stomach ulcer   Aspartame And Phenylalanine Hives   Bentyl  [Dicyclomine ] Other (See Comments)    Chest pain   Hibiclens  [Chlorhexidine  Gluconate] Other (See Comments)    Dermatitis    Flexeril  [Cyclobenzaprine ] Palpitations   Oxycontin  [Oxycodone ] Palpitations   Rifamycins Palpitations   Tylenol  [Acetaminophen ] Nausea And Vomiting, Nausea Only and Other (See Comments)    Irritates stomach ulcer Abdominal pain   Ultram  [Tramadol ] Nausea And Vomiting and Palpitations    Consultations: Renal Palliaitve Ortho   Procedures/Studies: CT ABDOMEN PELVIS WO CONTRAST Result Date: 02/28/2023 CLINICAL DATA:  Right-sided abdominal pain. EXAM: CT ABDOMEN AND PELVIS WITHOUT CONTRAST TECHNIQUE: Multidetector CT imaging of the abdomen and pelvis was performed following the standard protocol without IV contrast. RADIATION DOSE REDUCTION: This exam was performed according to the departmental dose-optimization program which includes automated exposure control, adjustment of the mA and/or kV according to  patient size and/or use of iterative reconstruction technique. COMPARISON:  02/17/2023 and earlier studies dating to 01/11/2020 FINDINGS: Lower chest: No acute findings. Hepatobiliary:  No mass visualized on this unenhanced exam. Pancreas: No mass or inflammatory process visualized on this unenhanced exam. Spleen:  Within normal limits in size. Adrenals/Urinary tract: No evidence of urolithiasis or hydronephrosis. Unremarkable unopacified urinary bladder. Stomach/Bowel: Image degradation by motion artifact noted in the lower abdomen and pelvis. No evidence of obstruction, inflammatory process, or abnormal fluid collections. Vascular/Lymphatic: No pathologically enlarged lymph nodes identified. No evidence of abdominal aortic aneurysm. Reproductive: Stable 4.7 cm mass contiguous with the left uterine fundus, consistent with a pedunculated fibroid. No evidence of inflammatory changes or ascites. Other:  None. Musculoskeletal:  No suspicious bone lesions identified. IMPRESSION: No acute findings. Stable pedunculated uterine fibroid. Electronically Signed   By: Norleen DELENA Kil M.D.   On: 02/28/2023 13:48   DG Chest Portable 1 View Result Date: 02/28/2023 CLINICAL DATA:  Chest pain. EXAM: PORTABLE CHEST 1 VIEW COMPARISON:  January 31, 2023. FINDINGS: Stable cardiomediastinal silhouette. Right internal jugular dialysis catheter is unchanged. Both lungs are clear. The visualized skeletal structures are unremarkable. IMPRESSION: No active disease. Electronically Signed   By: Lynwood Landy Raddle M.D.   On: 02/28/2023 12:32   DG Knee Right Port Result Date: 02/28/2023 CLINICAL DATA:  Amputation assessment. EXAM: PORTABLE RIGHT KNEE - 1-2 VIEW COMPARISON:  May 12, 2022. FINDINGS: Status post right below-knee amputation. Surgical staples are seen in the stomach. Stable degenerative changes seen involving the medial and lateral joint spaces. Stable osteochondroma arising medially from proximal tibia. IMPRESSION: Status post  right below-knee amputation. Stable degenerative changes of right knee joint as well as osteo chondroma arising from proximal tibia. Electronically Signed   By:  Lynwood Landy Raddle M.D.   On: 02/28/2023 12:31   VAS US  LOWER EXTREMITY VENOUS (DVT) Result Date: 02/19/2023  Lower Venous DVT Study Patient Name:  REYAH STREETER  Date of Exam:   02/18/2023 Medical Rec #: [id number]             Accession #:    [id number] Date of Birth: 1965-07-03             Patient Gender: F Patient Age:   4 years Exam Location:  [facility] Procedure:      VAS US  LOWER EXTREMITY VENOUS (DVT) Referring Phys: CHING TU --------------------------------------------------------------------------------  Indications: Pain.  Limitations: Poor ultrasound/tissue interface, body habitus and R BKA, patient positioning, patient pain tolerance. Comparison Study: No prior studies. Performing Technologist: Cordella Collet RVT  Examination Guidelines: A complete evaluation includes B-mode imaging, spectral Doppler, color Doppler, and power Doppler as needed of all accessible portions of each vessel. Bilateral testing is considered an integral part of a complete examination. Limited examinations for reoccurring indications may be performed as noted. The reflux portion of the exam is performed with the patient in reverse Trendelenburg.  +---------+---------------+---------+-----------+----------+--------------+ RIGHT    CompressibilityPhasicitySpontaneityPropertiesThrombus Aging +---------+---------------+---------+-----------+----------+--------------+ CFV      Full           Yes      Yes                                 +---------+---------------+---------+-----------+----------+--------------+ SFJ      Full                                                        +---------+---------------+---------+-----------+----------+--------------+ FV Prox  Full                                                         +---------+---------------+---------+-----------+----------+--------------+ FV Mid                  Yes      Yes                                 +---------+---------------+---------+-----------+----------+--------------+ FV Distal               Yes      Yes                                 +---------+---------------+---------+-----------+----------+--------------+ PFV      Full                                                        +---------+---------------+---------+-----------+----------+--------------+ POP      Full           Yes      Yes                                 +---------+---------------+---------+-----------+----------+--------------+  PTV                                                   BKA            +---------+---------------+---------+-----------+----------+--------------+ PERO                                                  BKA            +---------+---------------+---------+-----------+----------+--------------+   +----+---------------+---------+-----------+----------+--------------+ LEFTCompressibilityPhasicitySpontaneityPropertiesThrombus Aging +----+---------------+---------+-----------+----------+--------------+ CFV Full           Yes      Yes                                 +----+---------------+---------+-----------+----------+--------------+    Summary: RIGHT: - There is no evidence of deep vein thrombosis in the lower extremity. However, portions of this examination were limited- see technologist comments above.  - No cystic structure found in the popliteal fossa.  LEFT: - No evidence of common femoral vein obstruction.   *See table(s) above for measurements and observations. Electronically signed by Debby Robertson on 02/19/2023 at 12:35:23 PM.    Final    CT ABDOMEN PELVIS WO CONTRAST Result Date: 02/17/2023 CLINICAL DATA:  Acute abdominal pain. Postop from lower extremity amputation 3 weeks ago. EXAM: CT ABDOMEN AND PELVIS  WITHOUT CONTRAST TECHNIQUE: Multidetector CT imaging of the abdomen and pelvis was performed following the standard protocol without IV contrast. RADIATION DOSE REDUCTION: This exam was performed according to the departmental dose-optimization program which includes automated exposure control, adjustment of the mA and/or kV according to patient size and/or use of iterative reconstruction technique. COMPARISON:  Noncontrast CT on 01/27/2023 FINDINGS: Lower chest: No acute findings. Hepatobiliary: No mass visualized on this unenhanced exam. Prior cholecystectomy. No evidence of biliary obstruction. Pancreas: No mass or inflammatory process visualized on this unenhanced exam. Spleen:  Within normal limits in size. Adrenals/Urinary tract: No evidence of urolithiasis or hydronephrosis. Unremarkable unopacified urinary bladder. Stomach/Bowel: No evidence of obstruction, inflammatory process, or abnormal fluid collections. Vascular/Lymphatic: No pathologically enlarged lymph nodes identified. No evidence of abdominal aortic aneurysm. Reproductive: Uterus is unremarkable. A solid-appearing mass is seen in the left adnexal region measuring 4.6 cm in maximum diameter, which is contiguous with the left uterine fundus and unchanged since prior studies dating back to 01/11/2020. This is consistent with a pedunculated fibroid. No other masses or free fluid identified. Other:  None. Musculoskeletal:  No suspicious bone lesions identified. IMPRESSION: No acute findings. Stable 4.6 cm pedunculated fibroid in left adnexal region. Electronically Signed   By: Norleen DELENA Kil M.D.   On: 02/17/2023 15:52   CT Knee Right Wo Contrast Result Date: 02/17/2023 CLINICAL DATA:  Right knee stump pain. Recent below-knee amputation. EXAM: CT OF THE RIGHT KNEE WITHOUT CONTRAST TECHNIQUE: Multidetector CT imaging of the right knee was performed according to the standard protocol. Multiplanar CT image reconstructions were also generated. RADIATION  DOSE REDUCTION: This exam was performed according to the departmental dose-optimization program which includes automated exposure control, adjustment of the mA and/or kV according to patient size and/or use of iterative reconstruction technique. COMPARISON:  Right knee  and lower leg x-rays dated May 12, 2022. FINDINGS: Bones/Joint/Cartilage Postsurgical changes from below-knee amputation with sharp bony margins of the tibia and fibula. No bony destruction. No fracture or dislocation. Advanced tricompartmental degenerative changes with multiple intra-articular bodies. 2.8 x 2.6 cm osteochondroma arising from medial proximal tibia. Trace knee joint effusion. Ligaments Ligaments are suboptimally evaluated by CT. Muscles and Tendons Postsurgical changes. Soft tissue Mild soft tissue swelling of the amputation stump with 2.5 x 6.6 x 3.3 cm partially organized simple fluid collection overlying the superficial fascia anteromedially (series 4, image 152). No soft tissue mass. IMPRESSION: 1. Postsurgical changes from below-knee amputation with 2.5 x 6.6 x 3.3 cm partially organized simple fluid collection overlying the superficial fascia anteromedially. Favor postoperative seroma over abscess in the absence of any open wound or purulent drainage. 2. No CT evidence of osteomyelitis. 3. 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 4. 2.8 cm osteochondroma arising from medial proximal tibia. Electronically Signed   By: Elsie ONEIDA Shoulder M.D.   On: 02/17/2023 15:47   MR ANKLE LEFT WO CONTRAST Result Date: 02/11/2023 CLINICAL DATA:  Chronic ankle pain, heel wound EXAM: MRI OF THE LEFT ANKLE WITHOUT CONTRAST TECHNIQUE: Multiplanar, multisequence MR imaging of the ankle was performed. No intravenous contrast was administered. COMPARISON:  None Available. FINDINGS: Despite efforts by the technologist and patient, motion artifact is present on today's exam and could not be eliminated. This reduces  exam sensitivity and specificity. TENDONS Peroneal: Common peroneus tendon sheath tenosynovitis. Posteromedial: Distal tibialis posterior tendinopathy. Suspected small accessory navicular although less appreciable than I would expect on the T1 weighted sequence. Mild flexor hallucis longus tenosynovitis just proximal to the knot of Henry. Anterior: Unremarkable Achilles: Unremarkable Plantar Fascia: Abnormal thickening of the medial band compatible with plantar fasciitis. LIGAMENTS Lateral: Grossly intact, obscured by motion artifact. Medial: Obscured by motion artifact, integrity of the deltoid ligament is difficult to assess. CARTILAGE Ankle Joint: Grossly unremarkable. Subtalar Joints/Sinus Tarsi: Moderate degenerative spurring. Bones: Substantial degenerative spurring at the Chopart joint and in the midfoot. No findings of osteomyelitis. Other: Subcutaneous edema anterolaterally along the ankle and tracking into the dorsum of the foot. IMPRESSION: 1. No findings of osteomyelitis. 2. Common peroneus tendon sheath tenosynovitis. 3. Distal tibialis posterior tendinopathy. 4. Mild flexor hallucis longus tenosynovitis. 5. Plantar fasciitis. 6. Subcutaneous edema anterolaterally along the ankle and tracking into the dorsum of the foot. 7. Degenerative spurring at the Chopart joint and in the midfoot. Electronically Signed   By: Ryan Salvage M.D.   On: 02/11/2023 13:56   MR FOOT LEFT WO CONTRAST Result Date: 02/10/2023 CLINICAL DATA:  Chronic foot pain EXAM: MRI OF THE LEFT FOOT WITHOUT CONTRAST TECHNIQUE: Multiplanar, multisequence MR imaging of the left forefoot was performed. No intravenous contrast was administered. COMPARISON:  02/07/2023 FINDINGS: Despite efforts by the technologist and patient, motion artifact is present on today's exam and could not be eliminated. This reduces exam sensitivity and specificity. Bones/Joint/Cartilage No marrow edema in the forefoot to suggest osteomyelitis. Spurring of  the first metatarsal head noted with a 4 mm degenerative subcortical cystic lesion along the central articular surface of the first metatarsal head on image 9 series 6. First digit sesamoids unremarkable. No definite erosion observed. Mild degenerative findings including loss of articular space and spurring at the Lisfranc joint. Ligaments The Lisfranc ligament appears intact. No definite turf toe or plantar plate injury identified. Muscles and Tendons Moderate regional muscular atrophy. Soft tissues Subcutaneous edema along the dorsum of the foot and extending  into the toes. There is lateral subcutaneous edema at the level of the fifth MTP joint as well as subcutaneous edema along the ball of the foot also extending into the toes. IMPRESSION: 1. No marrow edema in the forefoot to suggest osteomyelitis. 2. Subcutaneous edema along the dorsum of the foot and extending into the toes, and also along the ball of the foot. 3. Moderate regional muscular atrophy. 4. Mild degenerative findings at the Lisfranc joint and first metatarsophalangeal joint. Electronically Signed   By: Ryan Salvage M.D.   On: 02/10/2023 18:59      Subjective:  Significant events overnight as discussed with staff, she had a good night sleep, appetite has been good, only 1 small BM yesterday as discussed with staff  Discharge Exam: Vitals:   03/08/23 0741 03/08/23 0744  BP: (!) 106/52   Pulse: 68 68  Resp:    Temp: 98.1 F (36.7 C)   SpO2:     Vitals:   03/08/23 0047 03/08/23 0439 03/08/23 0741 03/08/23 0744  BP: (!) 108/47 (!) 95/44 (!) 106/52   Pulse: 80 86 68 68  Resp: 17 18    Temp: 98 F (36.7 C) 98.2 F (36.8 C) 98.1 F (36.7 C)   TempSrc: Oral Oral Oral Oral  SpO2:  95%    Weight:      Height:        General: He has been sleeping comfortably upon my evaluation, no apparent distress, woke up, appropriate, no apparent distress Cardiovascular: RRR,  Respiratory: CTA bilaterally, no wheezing, no  rhonchi Abdominal: Soft, NT, ND, bowel sounds + Extremities: Right AKA, left foot wrapped    The results of significant diagnostics from this hospitalization (including imaging, microbiology, ancillary and laboratory) are listed below for reference.     Microbiology: Recent Results (from the past 240 hours)  Gastrointestinal Panel by PCR , Stool     Status: None   Collection Time: 02/28/23 11:16 AM   Specimen: Stool  Result Value Ref Range Status   Campylobacter species NOT DETECTED NOT DETECTED Final   Plesimonas shigelloides NOT DETECTED NOT DETECTED Final   Salmonella species NOT DETECTED NOT DETECTED Final   Yersinia enterocolitica NOT DETECTED NOT DETECTED Final   Vibrio species NOT DETECTED NOT DETECTED Final   Vibrio cholerae NOT DETECTED NOT DETECTED Final   Enteroaggregative E coli (EAEC) NOT DETECTED NOT DETECTED Final   Enteropathogenic E coli (EPEC) NOT DETECTED NOT DETECTED Final   Enterotoxigenic E coli (ETEC) NOT DETECTED NOT DETECTED Final   Shiga like toxin producing E coli (STEC) NOT DETECTED NOT DETECTED Final   Shigella/Enteroinvasive E coli (EIEC) NOT DETECTED NOT DETECTED Final   Cryptosporidium NOT DETECTED NOT DETECTED Final   Cyclospora cayetanensis NOT DETECTED NOT DETECTED Final   Entamoeba histolytica NOT DETECTED NOT DETECTED Final   Giardia lamblia NOT DETECTED NOT DETECTED Final   Adenovirus F40/41 NOT DETECTED NOT DETECTED Final   Astrovirus NOT DETECTED NOT DETECTED Final   Norovirus GI/GII NOT DETECTED NOT DETECTED Final   Rotavirus A NOT DETECTED NOT DETECTED Final   Sapovirus (I, II, IV, and V) NOT DETECTED NOT DETECTED Final    Comment: Performed at [facility], [address] [place], [place] [address]  C Difficile Quick Screen w PCR reflex     Status: None   Collection Time: 02/28/23 11:16 AM   Specimen: Stool  Result Value Ref Range Status   C Diff antigen NEGATIVE NEGATIVE Final   C Diff toxin  NEGATIVE NEGATIVE Final   C  Diff interpretation No C. difficile detected.  Final    Comment: Performed at [facility], [address] [place], [place] [address]     Labs: BNP (last 3 results) Recent Labs    06/18/22 1817 11/23/22 0023 12/14/22 1700  BNP 267.3* 120.4* 368.9*   Basic Metabolic Panel: Recent Labs  Lab 03/02/23 0409 03/03/23 0421 03/03/23 1616 03/04/23 0512 03/05/23 0228  NA 137 137 137 135 136  K 2.4* 2.9* 3.1* 3.8 3.6  CL 105 106 107 108 106  CO2 20* 17* 16* 13* 15*  GLUCOSE 78 113* 133* 126* 151*  BUN 16 18 19  22* 29*  CREATININE 3.83* 4.73* 5.04* 5.94* 7.01*  CALCIUM  7.5* 7.7* 8.1* 8.1* 8.7*  PHOS  --   --  6.8*  --  9.0*   Liver Function Tests: Recent Labs  Lab 03/02/23 0409 03/03/23 1616 03/05/23 0228  AST 18  --   --   ALT 13  --   --   ALKPHOS 68  --   --   BILITOT 0.6  --   --   PROT 5.9*  --   --   ALBUMIN  2.1* 2.4* 2.6*   No results for input(s): LIPASE, AMYLASE in the last 168 hours. No results for input(s): AMMONIA in the last 168 hours. CBC: Recent Labs  Lab 03/02/23 0409 03/04/23 0512 03/05/23 0228  WBC 13.3* 16.8* 18.4*  HGB 8.1* 9.1* 9.5*  HCT 26.5* 28.9* 30.9*  MCV 94.3 95.1 94.2  PLT 310 320 320   Cardiac Enzymes: No results for input(s): CKTOTAL, CKMB, CKMBINDEX, TROPONINI in the last 168 hours. BNP: Invalid input(s): POCBNP CBG: Recent Labs  Lab 03/06/23 2130 03/07/23 0835 03/07/23 1207 03/07/23 1625 03/08/23 0750  GLUCAP 182* 111* 97 88 97   D-Dimer No results for input(s): DDIMER in the last 72 hours. Hgb A1c No results for input(s): HGBA1C in the last 72 hours. Lipid Profile No results for input(s): CHOL, HDL, LDLCALC, TRIG, CHOLHDL, LDLDIRECT in the last 72 hours. Thyroid  function studies No results for input(s): TSH, T4TOTAL, T3FREE, THYROIDAB in the last 72 hours.  Invalid input(s): FREET3 Anemia work up No results for input(s): VITAMINB12, FOLATE, FERRITIN, TIBC,  IRON , RETICCTPCT in the last 72 hours. Urinalysis    Component Value Date/Time   COLORURINE STRAW (A) 06/19/2022 0638   APPEARANCEUR HAZY (A) 06/19/2022 0638   LABSPEC 1.010 06/19/2022 0638   PHURINE 5.0 06/19/2022 0638   GLUCOSEU NEGATIVE 06/19/2022 0638   HGBUR SMALL (A) 06/19/2022 0638   BILIRUBINUR NEGATIVE 06/19/2022 0638   KETONESUR NEGATIVE 06/19/2022 0638   PROTEINUR 30 (A) 06/19/2022 0638   UROBILINOGEN 0.2 10/02/2013 2108   NITRITE NEGATIVE 06/19/2022 0638   LEUKOCYTESUR LARGE (A) 06/19/2022 0638   Sepsis Labs Recent Labs  Lab 03/02/23 0409 03/04/23 0512 03/05/23 0228  WBC 13.3* 16.8* 18.4*   Microbiology Recent Results (from the past 240 hours)  Gastrointestinal Panel by PCR , Stool     Status: None   Collection Time: 02/28/23 11:16 AM   Specimen: Stool  Result Value Ref Range Status   Campylobacter species NOT DETECTED NOT DETECTED Final   Plesimonas shigelloides NOT DETECTED NOT DETECTED Final   Salmonella species NOT DETECTED NOT DETECTED Final   Yersinia enterocolitica NOT DETECTED NOT DETECTED Final   Vibrio species NOT DETECTED NOT DETECTED Final   Vibrio cholerae NOT DETECTED NOT DETECTED Final   Enteroaggregative E coli (EAEC) NOT DETECTED NOT DETECTED  Final   Enteropathogenic E coli (EPEC) NOT DETECTED NOT DETECTED Final   Enterotoxigenic E coli (ETEC) NOT DETECTED NOT DETECTED Final   Shiga like toxin producing E coli (STEC) NOT DETECTED NOT DETECTED Final   Shigella/Enteroinvasive E coli (EIEC) NOT DETECTED NOT DETECTED Final   Cryptosporidium NOT DETECTED NOT DETECTED Final   Cyclospora cayetanensis NOT DETECTED NOT DETECTED Final   Entamoeba histolytica NOT DETECTED NOT DETECTED Final   Giardia lamblia NOT DETECTED NOT DETECTED Final   Adenovirus F40/41 NOT DETECTED NOT DETECTED Final   Astrovirus NOT DETECTED NOT DETECTED Final   Norovirus GI/GII NOT DETECTED NOT DETECTED Final   Rotavirus A NOT DETECTED NOT DETECTED Final   Sapovirus (I,  II, IV, and V) NOT DETECTED NOT DETECTED Final    Comment: Performed at [facility], [address] [place], [place] [address]  C Difficile Quick Screen w PCR reflex     Status: None   Collection Time: 02/28/23 11:16 AM   Specimen: Stool  Result Value Ref Range Status   C Diff antigen NEGATIVE NEGATIVE Final   C Diff toxin NEGATIVE NEGATIVE Final   C Diff interpretation No C. difficile detected.  Final    Comment: Performed at [facility], [address] [place], [place] [address]     Time coordinating discharge: Over 30 minutes  SIGNED:   Brayton Lye, MD  [facility] 03/08/2023, 11:39 AM Pager   If 7PM-7AM, please contact night-coverage [email or web address]

## 2023-03-08 NOTE — Discharge Instructions (Signed)
 Follow with Primary MD  Get CBC, CMP,  checked  by Primary MD next visit.   Disposition Home    Diet: Renal Diet   On your next visit with your primary care physician please Get Medicines reviewed and adjusted.   Please request your Prim.MD to go over all Hospital Tests and Procedure/Radiological results at the follow up, please get all Hospital records sent to your Prim MD by signing hospital release before you go home.   If you experience worsening of your admission symptoms, develop shortness of breath, life threatening emergency, suicidal or homicidal thoughts you must seek medical attention immediately by calling 911 or calling your MD immediately  if symptoms less severe.  You Must read complete instructions/literature along with all the possible adverse reactions/side effects for all the Medicines you take and that have been prescribed to you. Take any new Medicines after you have completely understood and accpet all the possible adverse reactions/side effects.   Do not drive, operating heavy machinery, perform activities at heights, swimming or participation in water activities or provide baby sitting services if your were admitted for syncope or siezures until you have seen by Primary MD or a Neurologist and advised to do so again.  Do not drive when taking Pain medications.    Do not take more than prescribed Pain, Sleep and Anxiety Medications  Special Instructions: If you have smoked or chewed Tobacco  in the last 2 yrs please stop smoking, stop any regular Alcohol  and or any Recreational drug use.  Wear Seat belts while driving.   Please note  You were cared for by a hospitalist during your hospital stay. If you have any questions about your discharge medications or the care you received while you were in the hospital after you are discharged, you can call the unit and asked to speak with the hospitalist on call if the hospitalist that took care of you is not  available. Once you are discharged, your primary care physician will handle any further medical issues. Please note that NO REFILLS for any discharge medications will be authorized once you are discharged, as it is imperative that you return to your primary care physician (or establish a relationship with a primary care physician if you do not have one) for your aftercare needs so that they can reassess your need for medications and monitor your lab values.

## 2023-03-08 NOTE — TOC Progression Note (Signed)
 Transition of Care [facility]) - Progression Note    Patient Details  Name: Deborah [name] MRN: [id number] Date of Birth: [birth year]  [facility]) CM/SW Contact  Hendricks KANDICE Her, RN Phone Number: 03/08/2023, 11:33 AM  Clinical Narrative:     RNCM met with patient bedside to discuss dc for today. Patient will have HD here and then DC to home. Patient states she cannot leave because transportation to /from her [place] is difficult. She stated her Medicaid transportation ends today. She also stated her Husband will not be home from work until 7:00. Then she got angry and stated she would just get a neighbor to give her a key and she will go home by herself . RNCM will discuss transportation options thru Medicaid with CSW.           Expected Discharge Plan and Services                                               Social Determinants of Health (SDOH) Interventions SDOH Screenings   Food Insecurity: No Food Insecurity (02/28/2023)  Recent Concern: Food Insecurity - High Risk (01/25/2023)   Received from [facility]  Housing: High Risk (02/28/2023)  Transportation Needs: Unmet Transportation Needs (02/28/2023)  Utilities: Not At Risk (02/28/2023)  Recent Concern: Utilities - Medium Risk (01/25/2023)   Received from [facility]  Depression (PHQ2-9): High Risk (11/18/2022)  Financial Resource Strain: Low Risk (03/23/2021)   Received from [facility])  Physical Activity: Not on File (10/31/2017)   Received from [place], [place]  Social Connections: Unknown (07/19/2021)   Received from [facility], [facility]  Stress: Low Risk (03/23/2021)   Received from [facility])  Tobacco Use: Low Risk  (02/28/2023)    Readmission Risk Interventions    03/07/2023    4:19 PM 11/25/2022    5:16 PM 07/05/2022    1:20 PM  Readmission Risk Prevention Plan  Transportation Screening Complete Complete  Complete  Medication Review (RN Care Manager) Complete Complete Referral to Pharmacy  PCP or Specialist appointment within 3-5 days of discharge Complete Complete Complete  HRI or Home Care Consult Complete Complete Complete  SW Recovery Care/Counseling Consult Complete Complete Complete  Palliative Care Screening Complete Not Applicable Not Applicable  Skilled Nursing Facility Not Applicable Not Applicable Not Applicable

## 2023-03-08 NOTE — TOC Transition Note (Signed)
 Transition of Care [facility]) - Discharge Note   Patient Details  Name: Deborah [name] MRN: [id number] Date of Birth: 14-Jul-1965  Transition of Care [facility]) CM/SW Contact:  Hendricks KANDICE Her, RN Phone Number: 03/08/2023, 3:25 PM   Clinical Narrative:     Patient will DC to home today [facility] has been arranged for 6:30 pick up. Home Health will resume from [facility]   No additional TOC needs          Patient Goals and CMS Choice            Discharge Placement                       Discharge Plan and Services Additional resources added to the After Visit Summary for                                       Social Drivers of Health (SDOH) Interventions SDOH Screenings   Food Insecurity: No Food Insecurity (02/28/2023)  Recent Concern: Food Insecurity - High Risk (01/25/2023)   Received from [facility]  Housing: High Risk (02/28/2023)  Transportation Needs: Unmet Transportation Needs (02/28/2023)  Utilities: Not At Risk (02/28/2023)  Recent Concern: Utilities - Medium Risk (01/25/2023)   Received from [facility]  Depression (PHQ2-9): High Risk (11/18/2022)  Financial Resource Strain: Low Risk (03/23/2021)   Received from [facility])  Physical Activity: Not on File (10/31/2017)   Received from [facility] [place]  Social Connections: Unknown (07/19/2021)   Received from [facility], [facility]  Stress: Low Risk (03/23/2021)   [facility])  Tobacco Use: Low Risk  (02/28/2023)     Readmission Risk Interventions    03/07/2023    4:19 PM 11/25/2022    5:16 PM 07/05/2022    1:20 PM  Readmission Risk Prevention Plan  Transportation Screening Complete Complete Complete  Medication Review (RN Care Manager) Complete Complete Referral to Pharmacy  PCP or Specialist appointment within 3-5 days of discharge Complete Complete Complete  HRI or Home Care Consult Complete Complete  Complete  SW Recovery Care/Counseling Consult Complete Complete Complete  Palliative Care Screening Complete Not Applicable Not Applicable  Skilled Nursing Facility Not Applicable Not Applicable Not Applicable

## 2023-03-08 NOTE — Progress Notes (Addendum)
 Received patient in bed to unit.  Alert and oriented.  Informed consent signed and in chart.   TX duration:29 minutes   Patient tolerated well.  Transported back to the room  Alert, without acute distress.  Hand-off given to patient's nurse.   Access used: right HD catheter Access issues: alarmed a lot during the 46 minutes of tx  Total UF removed: 0 Medication(s) given: none   03/08/23 1440  Vitals  BP 105/61  MAP (mmHg) 76  BP Location Right Wrist  BP Method Automatic  Patient Position (if appropriate) Lying  Pulse Rate 77  Pulse Rate Source Monitor  ECG Heart Rate 77  Resp 16  Oxygen Therapy  SpO2 100 %  O2 Device Room Air  During Treatment Monitoring  Blood Flow Rate (mL/min) 399 mL/min  Arterial Pressure (mmHg) -154.94 mmHg  Venous Pressure (mmHg) 64.24 mmHg  TMP (mmHg) 18.18 mmHg  Ultrafiltration Rate (mL/min) 1002 mL/min  Dialysate Flow Rate (mL/min) 299 ml/min  Dialysate Potassium Concentration 3  Dialysate Calcium  Concentration 2.5  Duration of HD Treatment -hour(s) 0.46 hour(s)  Cumulative Fluid Removed (mL) per Treatment  -47.41  HD Safety Checks Performed Yes  Intra-Hemodialysis Comments  (tx terminated per pt request stating stomach hurting refrused p.o pain medication, reached out to attending DR. Elgergawy letting him know pt said po doesnt work he replied no iv pain meds will be ordered at this time.)  Dialysis Fluid Bolus Normal Saline  Bolus Amount (mL) 300 mL      Deborah [name] S [facility]

## 2023-03-08 NOTE — Progress Notes (Signed)
Refused ? ?

## 2023-03-08 NOTE — Progress Notes (Signed)
 AVS reviewed with pt by primary RN, Lucca Greggs. All questions answered at bedside, with pt verbalizing understanding of discharge instructions. PIV removed and documented. TOC medications received by pt. Pt in NAD at this time, and is awaiting pick up from [facility] to go home. Per pt, husband is waiting for pt at home.

## 2023-03-09 NOTE — Discharge Planning (Signed)
 [facility] Patient Discharge Orders- [facility] CLINIC: [place]  Patient's name: Deborah [name] Admit/DC Dates: 02/28/2023 - 03/08/2023  Discharge Diagnoses: Intractable diarrhea   ESRD  Aranesp : Given: Yes   Date and amount of last dose: 25mcg on 03/12/23  Last Hgb: 8.0 PRBC's Given: no Date/# of units: N/A ESA dose for discharge: mircera 100 mcg IV q 2 weeks  IV Iron  dose at discharge: none  Heparin  change: no  EDW Change: Yes New EDW:  110kg  Bath Change: no- continue 3K bath  Access intervention/Change: no Details:  Hectorol /Calcitriol change: no  Discharge Labs: Calcium  8.0 Phosphorus 8.3 Albumin  2.1 K+ 2.8  IV Antibiotics: no Details:  On Coumadin?: no Last INR: Next INR: Managed By:   OTHER/APPTS/LAB ORDERS: please check potassium levels weekly    D/C Meds to be reconciled by nurse after every discharge.  Completed By: Lucie Collet, PA-C 03/09/2023, 12:12 PM   [facility] Pager: [phone]   Reviewed by: MD:______ RN_______

## 2023-03-10 ENCOUNTER — Telehealth: Payer: Self-pay | Admitting: Physician Assistant

## 2023-03-10 NOTE — Telephone Encounter (Signed)
 Transition of care contact from inpatient facility  Date of Discharge: 03/08/23 Date of Contact: 03/10/23 Method of contact: Phone  Attempted to contact patient to discuss transition of care from inpatient admission. Patient did not answer the phone. Message was left on the patient's voicemail with call back number [phone].  Lucie Collet, PA-C 03/10/2023, 12:01 PM  [facility] Pager: [phone]

## 2023-03-12 IMAGING — CR DG CHEST 2V
2 series · 2 of 2 positions shown · non-contrast
Comparison: Chest radiograph dated 09/14/2020.

CLINICAL DATA: Chest pain.

EXAM:
CHEST - 2 VIEW

[w chest lat]
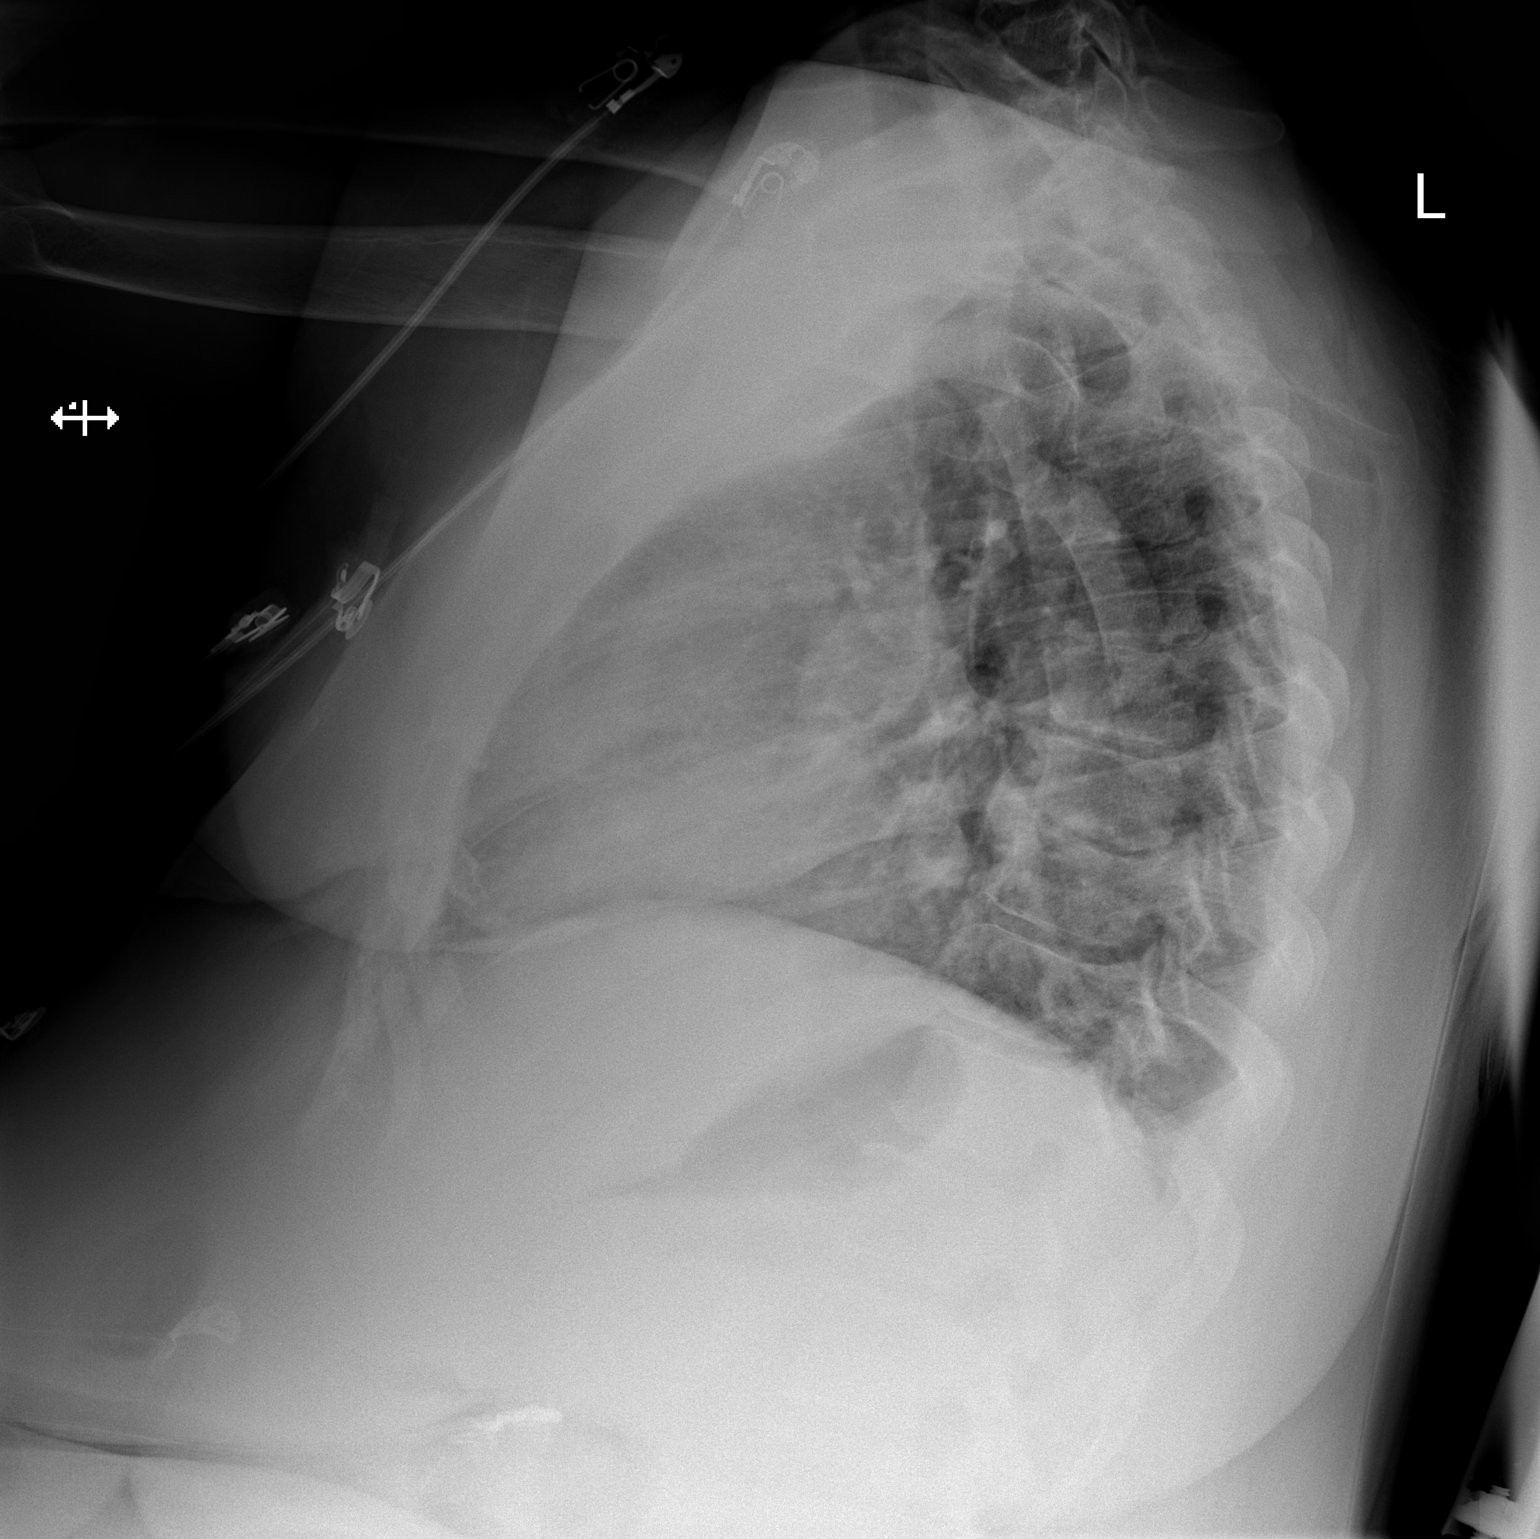

[x chest ap]
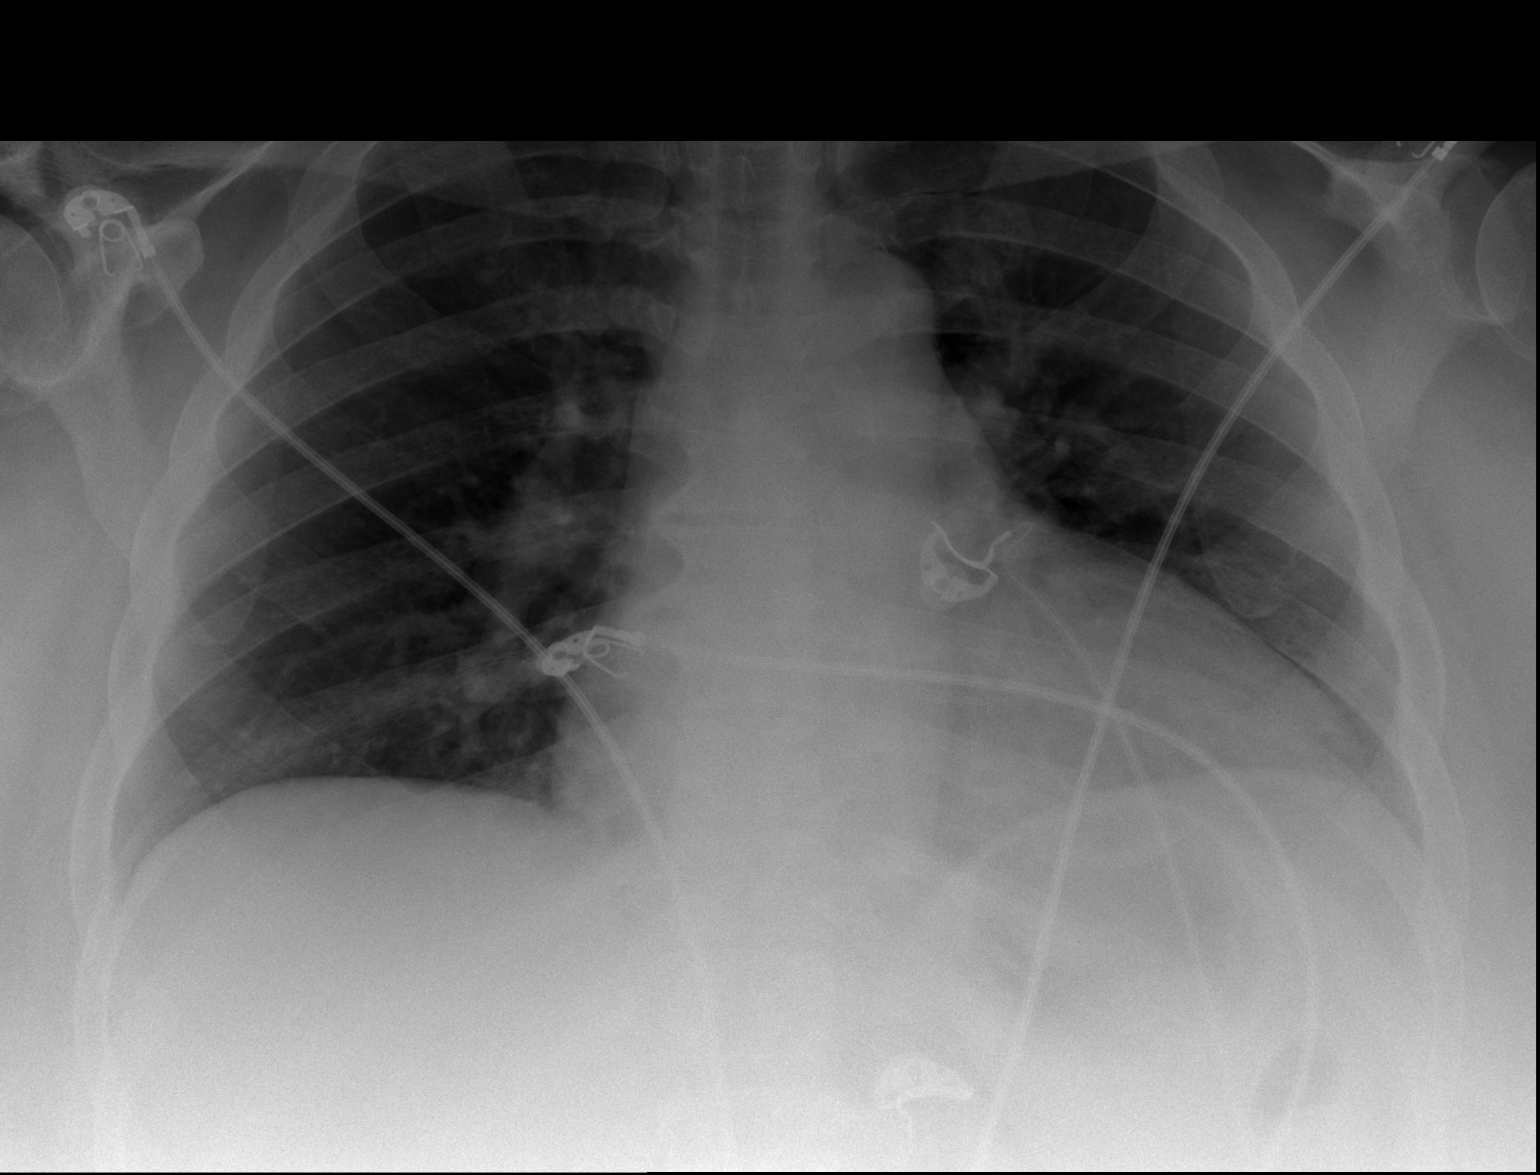

[2 of 2 positions shown; findings below may reference images not displayed]

FINDINGS: Shallow inspiration. No focal consolidation, pleural effusion or
pneumothorax. No acute osseous pathology. Degenerative changes of
the spine.
IMPRESSION: No active cardiopulmonary disease.

## 2023-03-12 IMAGING — CT CT ABD-PELV W/O CM
2 of 4 series · 16 of 46 positions shown, 18 images · non-contrast
Comparison: CT of the abdomen and pelvis 11/20/2020.

CLINICAL DATA: 55-year-old female with history of abdominal pain.

EXAM:
CT ABDOMEN AND PELVIS WITHOUT CONTRAST
TECHNIQUE: Multidetector CT imaging of the abdomen and pelvis was performed
following the standard protocol without IV contrast.

[Series 2: axial st · axial · 0.98mm/px · z∈[+1065,+1465]mm · 13 of 92 slices shown, 15 images]
[im 6/92  soft-tissue]
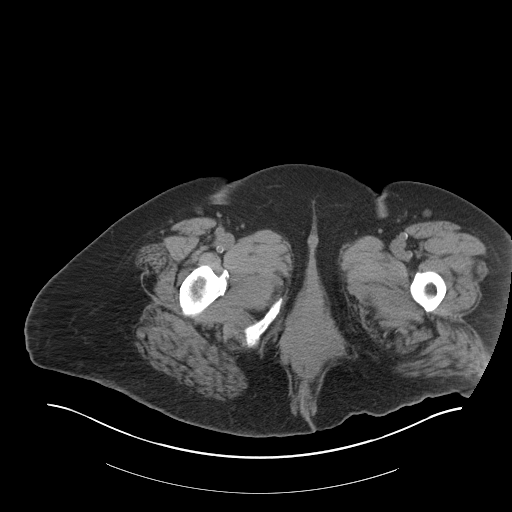
[im 6/92  bone]
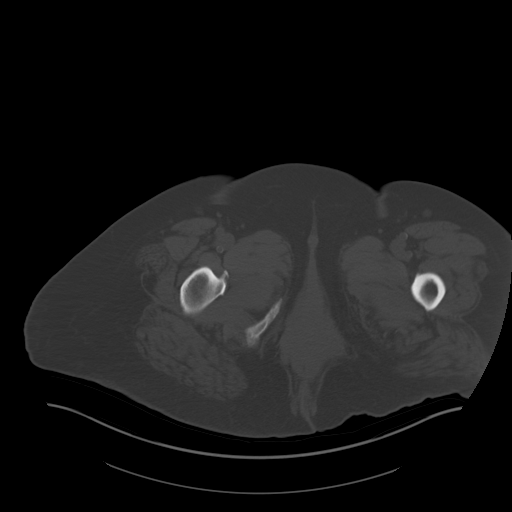
[im 11/92  soft-tissue]
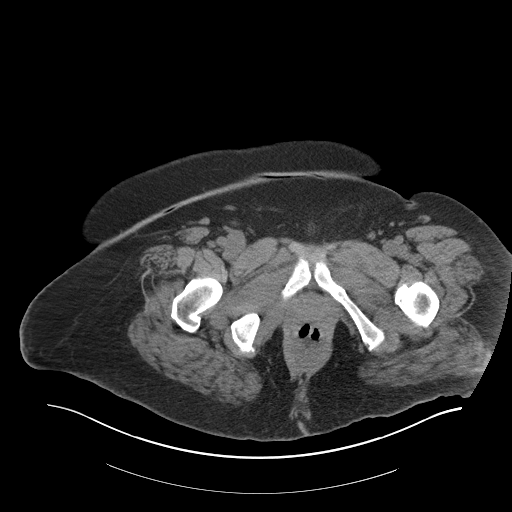
[im 21/92  soft-tissue]
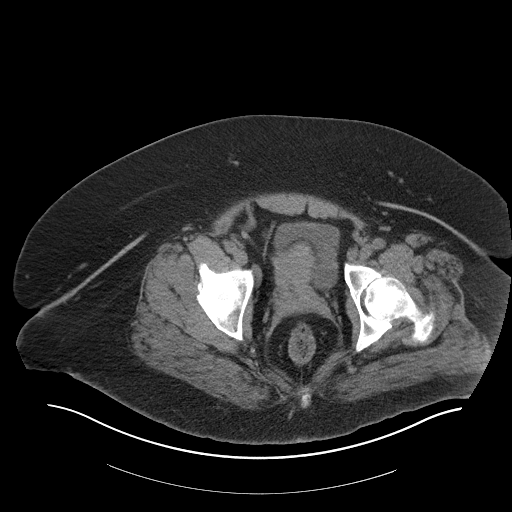
[im 26/92  soft-tissue]
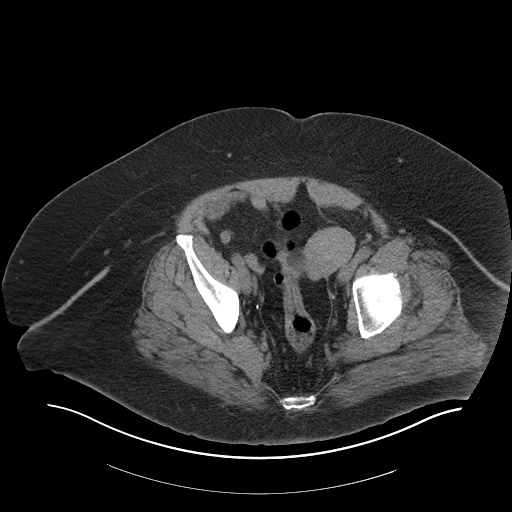
[im 31/92  soft-tissue]
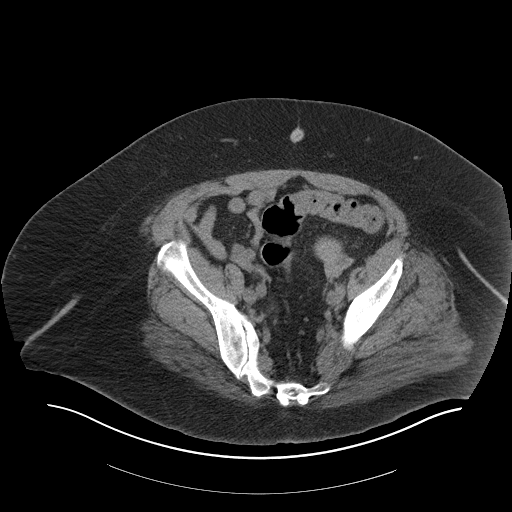
[im 41/92  soft-tissue]
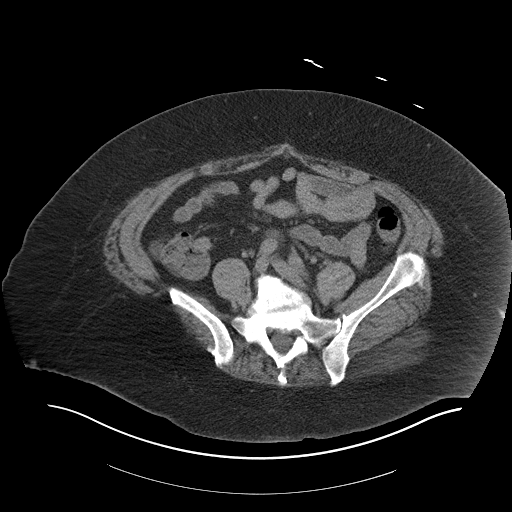
[im 46/92  soft-tissue]
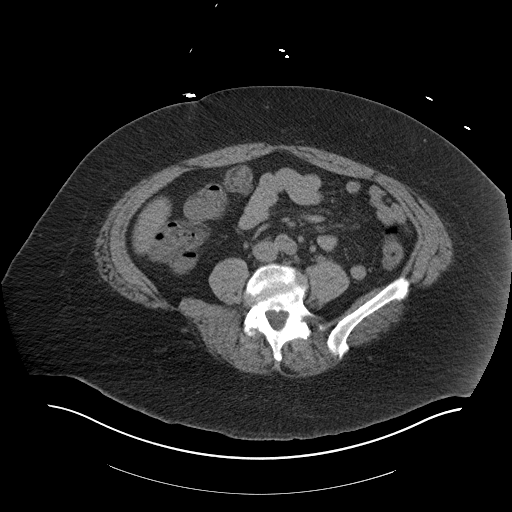
[im 51/92  soft-tissue]
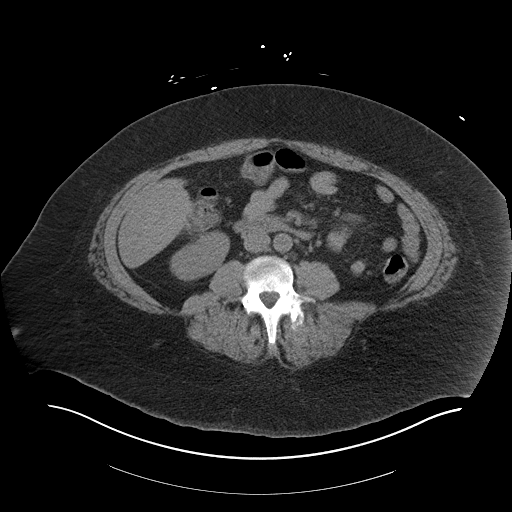
[im 61/92  soft-tissue]
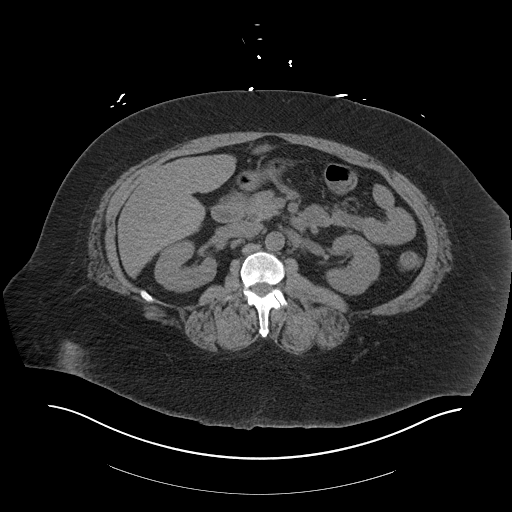
[im 61/92  bone]
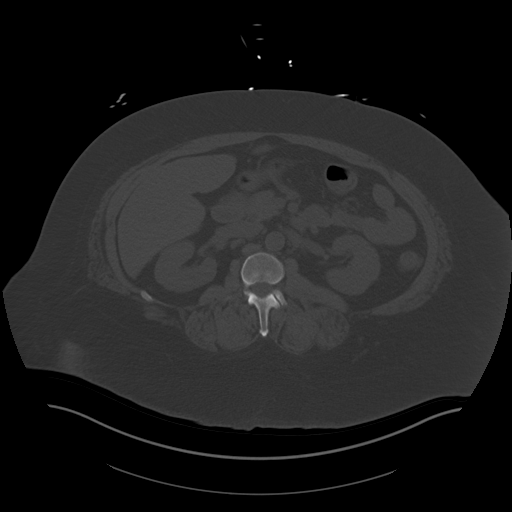
[im 66/92  soft-tissue]
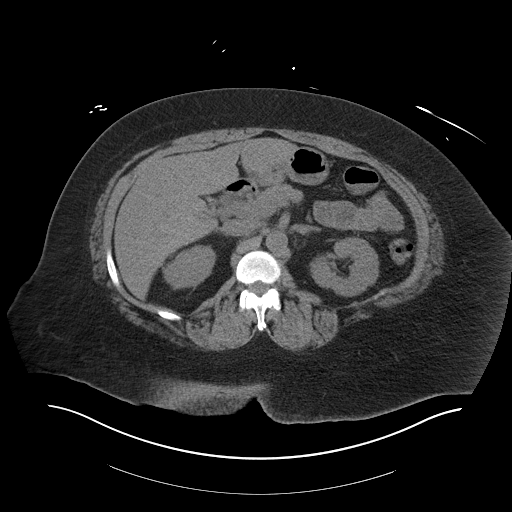
[im 71/92  soft-tissue]
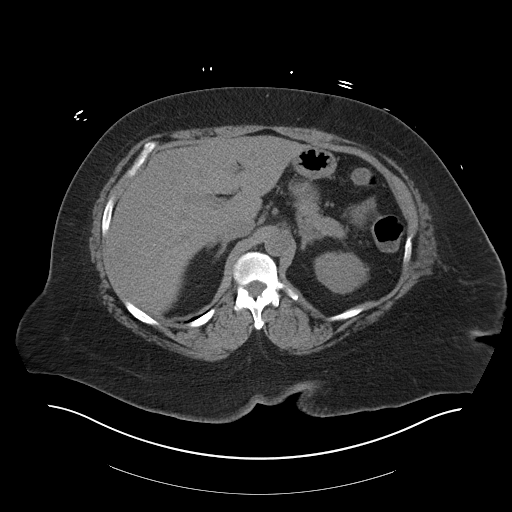
[im 81/92  soft-tissue]
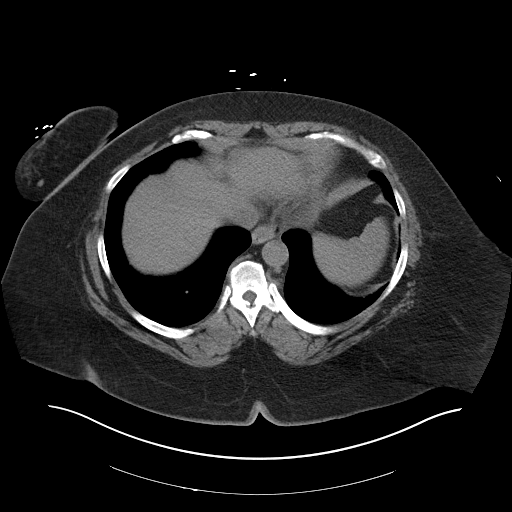
[im 86/92  soft-tissue]
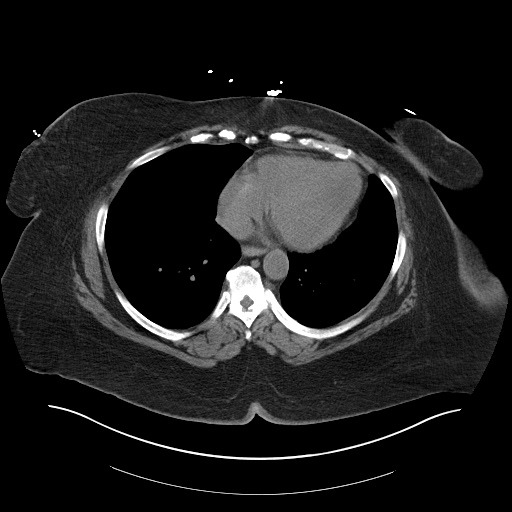

[Series 5: coronal st · coronal · 1.02mm/px · 3 of 190 slices shown]
[im 64/190  soft-tissue]
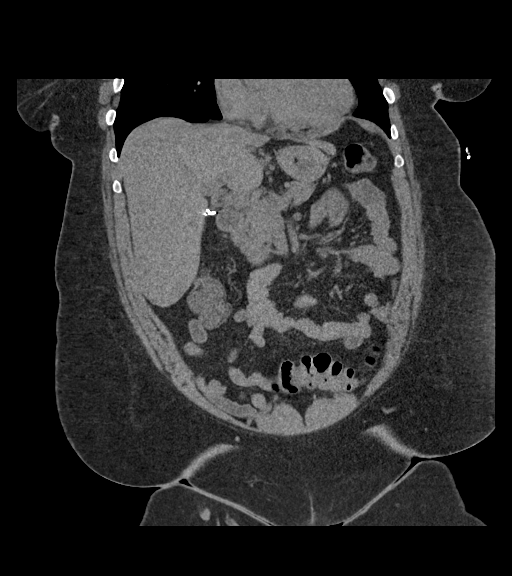
[im 85/190  soft-tissue]
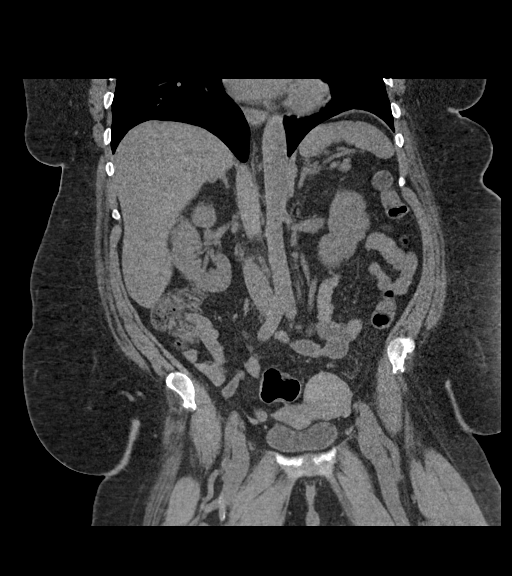
[im 106/190  soft-tissue]
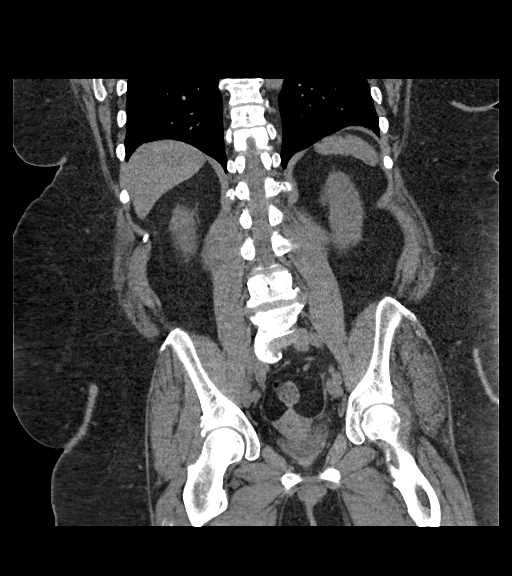

[16 of 46 positions shown; findings below may reference images not displayed]

FINDINGS: Lower chest: Unremarkable.

Hepatobiliary: No suspicious cystic or solid hepatic lesions are
confidently identified on today's noncontrast CT examination. Status
post cholecystectomy.

Pancreas: No definite pancreatic mass or peripancreatic fluid
collections or inflammatory changes noted on today's noncontrast CT
examination.

Spleen: Unremarkable.

Adrenals/Urinary Tract: There are no abnormal calcifications within
the collecting system of either kidney, along the course of either
ureter, or within the lumen of the urinary bladder. No
hydroureteronephrosis or perinephric stranding to suggest urinary
tract obstruction at this time. The unenhanced appearance of the
kidneys is unremarkable bilaterally. Unenhanced appearance of the
urinary bladder is normal. Bilateral adrenal glands are normal in
appearance.

Stomach/Bowel: Unenhanced appearance of the stomach is normal. No
pathologic dilatation of small bowel or colon. Normal appendix.

Vascular/Lymphatic: Atherosclerotic calcifications in the abdominal
aorta. No lymphadenopathy noted in the abdomen or pelvis.

Reproductive: Again noted associated with the left side of the
uterine fundus and left adnexal region there is a 5.0 x 4.1 x 4.5 cm
lesion (axial image 67 of series 2 and coronal image 84 of series 5)
which is stable compared to numerous prior examinations, favored to
represent a large exophytic subserosal fibroid. Right ovary is
unremarkable in appearance.

Other: No significant volume of ascites.  No pneumoperitoneum.

Musculoskeletal: There are no aggressive appearing lytic or blastic
lesions noted in the visualized portions of the skeleton.
IMPRESSION: 1. No acute findings are noted in the abdomen or pelvis to account
for the patient's symptoms.
2. Aortic atherosclerosis.
3. Large lesion intimately associated with the left side of the
uterine fundus and left adnexal region, very similar to numerous
prior examinations, presumably a benign lesion such as a large
exophytic subserosal fibroid.

## 2023-03-15 ENCOUNTER — Telehealth: Payer: Self-pay | Admitting: Orthopedic Surgery

## 2023-03-15 NOTE — Telephone Encounter (Signed)
 Deborah [name] with [facility] home [facility] called. Would like verbal orders to continue home PT. [phone]

## 2023-03-15 NOTE — Telephone Encounter (Signed)
 Can you call this pt. She has never had post op follow up in the office since her revision of a BKA 02/02/2023. Can work on [employer] schedule. Would like to see in the next week

## 2023-03-16 ENCOUNTER — Telehealth: Payer: Self-pay | Admitting: Orthopedic Surgery

## 2023-03-16 ENCOUNTER — Encounter: Payer: Self-pay | Admitting: Registered Nurse

## 2023-03-16 ENCOUNTER — Telehealth: Payer: Self-pay | Admitting: Registered Nurse

## 2023-03-16 ENCOUNTER — Encounter: Payer: 59 | Attending: Registered Nurse | Admitting: Registered Nurse

## 2023-03-16 VITALS — BP 102/44 | HR 86 | Ht 66.0 in | Wt 242.5 lb

## 2023-03-16 DIAGNOSIS — M545 Low back pain, unspecified: Secondary | ICD-10-CM | POA: Insufficient documentation

## 2023-03-16 DIAGNOSIS — G893 Neoplasm related pain (acute) (chronic): Secondary | ICD-10-CM | POA: Insufficient documentation

## 2023-03-16 DIAGNOSIS — Z79891 Long term (current) use of opiate analgesic: Secondary | ICD-10-CM | POA: Diagnosis present

## 2023-03-16 DIAGNOSIS — Z5181 Encounter for therapeutic drug level monitoring: Secondary | ICD-10-CM | POA: Diagnosis present

## 2023-03-16 DIAGNOSIS — M25512 Pain in left shoulder: Secondary | ICD-10-CM | POA: Diagnosis present

## 2023-03-16 DIAGNOSIS — M25511 Pain in right shoulder: Secondary | ICD-10-CM | POA: Insufficient documentation

## 2023-03-16 DIAGNOSIS — G8929 Other chronic pain: Secondary | ICD-10-CM | POA: Diagnosis present

## 2023-03-16 DIAGNOSIS — M2550 Pain in unspecified joint: Secondary | ICD-10-CM | POA: Insufficient documentation

## 2023-03-16 DIAGNOSIS — G894 Chronic pain syndrome: Secondary | ICD-10-CM | POA: Insufficient documentation

## 2023-03-16 MED ORDER — HYDROMORPHONE HCL 4 MG PO TABS: 4.0000 mg | ORAL_TABLET | Freq: Three times a day (TID) | ORAL | 0 refills | Status: DC | PRN

## 2023-03-16 NOTE — Progress Notes (Signed)
 Subjective:    Patient ID: Deborah [name], female    DOB: 10-Nov-1965, 58 y.o.   MRN: [id number]  HPI: Deborah [name] is a 58 y.o. female who returns for follow up appointment for chronic pain and medication refill. She states her pain is located in her bilateral shoulders, lower back and generalized joint pain . She rates her pain 10. She reports she is not following a exercise regimen at this time.  Deborah [name] last office visit was 11/18/2022, since then she has had several ED visit and Hospitalizations, see dicharge summaries for more details.   Deborah [name] underwent: oon 12/29/2022: Dr Harden RIGHT BELOW KNEE AMPUTATION [facility]  On 02/02/2023: Dr Harden REVISION RIGHT BELOW KNEE AMPUTATION   She was recently discharge on 03/08/2023. Discharge summary was reviewed.    Deborah [name] Morphine  equivalent is 53.33 MME.   Oral Swab was Performed today.   Pain Inventory Average Pain 10 Pain Right Now 10 My pain is constant  In the last 24 hours, has pain interfered with the following? General activity 8 Relation with others 8 Enjoyment of life 8 What TIME of day is your pain at its worst? morning , daytime, evening, and night Sleep (in general) NA  Pain is worse with: inactivity and some activites Pain improves with: medication Relief from Meds:  a  Family History  Problem Relation Age of Onset   Diabetes Mother    Diabetes Father    Heart disease Father    Diabetes Sister    Congestive Heart Failure Sister 38   Diabetes Brother    Social History   Socioeconomic History   Marital status: Married    Spouse name: Not on file   Number of children: Not on file   Years of education: Not on file   Highest education level: Not on file  Occupational History   Not on file  Tobacco Use   Smoking status: Never   Smokeless tobacco: Never  Vaping Use   Vaping status: Never Used  Substance and Sexual Activity   Alcohol use: No   Drug use: No   Sexual  activity: Not Currently    Birth control/protection: None  Other Topics Concern   Not on file  Social History Narrative   ** Merged History Encounter **       Social Drivers of Health   Financial Resource Strain: Low Risk (03/23/2021)   Received from [facility])   Financial Resource Strain  Food Insecurity: No Food Insecurity (02/28/2023)   Hunger Vital Sign    Worried About Running Out of Food in the Last Year: Never true    Ran Out of Food in the Last Year: Never true  Recent Concern: Food Insecurity - High Risk (01/25/2023)   Received from [facility]   Hunger Vital Sign    Worried About Running Out of Food in the Last Year: Patient declined to answer    Ran Out of Food in the Last Year: Often true  Transportation Needs: Unmet Transportation Needs (02/28/2023)   PRAPARE - Transportation    Lack of Transportation (Medical): Yes    Lack of Transportation (Non-Medical): Yes  Physical Activity: Not on File (10/31/2017)   Received from [place] [place]   Physical Activity    Physical Activity: 0  Stress: Low Risk (03/23/2021)   [facility])   Stress    Over the last 2 weeks, how often have  you been bothered by the following problems: feeling nervous, anxious, on edge?: Not at all    Over the last 2 weeks, how often have you been bothered by the following problems: Not being able to stop or control worrying?: Not at all  Social Connections: Unknown (07/19/2021)   Received from [facility]   Social Network    Social Network: Not on file   Past Surgical History:  Procedure Laterality Date   ABDOMINAL AORTOGRAM W/LOWER EXTREMITY N/A 11/29/2022   Procedure: ABDOMINAL AORTOGRAM W/LOWER EXTREMITY;  Surgeon: Pearline Norman RAMAN, MD;  Location: [facility] INVASIVE CV LAB;  Service: Cardiovascular;  Laterality: N/A;   AMPUTATION Right 12/29/2022   Procedure: RIGHT BELOW KNEE AMPUTATION;  Surgeon: Harden Jerona GAILS, MD;  Location: [facility] OR;  Service: Orthopedics;  Laterality: Right;   AV FISTULA PLACEMENT Left 06/30/2022   Procedure: LEFT BRACHIOCEPHALIC ARTERIOVENOUS (AV) FISTULA CREATION;  Surgeon: Gretta Lonni PARAS, MD;  Location: [facility] OR;  Service: Vascular;  Laterality: Left;   BIOPSY  07/27/2019   Procedure: BIOPSY;  Surgeon: Rosalie Kitchens, MD;  Location: [facility] ENDOSCOPY;  Service: Endoscopy;;   BIOPSY  07/30/2019   Procedure: BIOPSY;  Surgeon: Elicia Claw, MD;  Location: [facility] ENDOSCOPY;  Service: Gastroenterology;;   CATARACT EXTRACTION  01/2014   CHOLECYSTECTOMY     COLONOSCOPY WITH PROPOFOL  N/A 07/30/2019   Procedure: COLONOSCOPY WITH PROPOFOL ;  Surgeon: Elicia Claw, MD;  Location: [facility] ENDOSCOPY;  Service: Gastroenterology;  Laterality: N/A;   ESOPHAGOGASTRODUODENOSCOPY N/A 07/27/2019   Procedure: ESOPHAGOGASTRODUODENOSCOPY (EGD);  Surgeon: Rosalie Kitchens, MD;  Location: [facility];  Service: Endoscopy;  Laterality: N/A;   ESOPHAGOGASTRODUODENOSCOPY N/A 07/26/2020   Procedure: ESOPHAGOGASTRODUODENOSCOPY (EGD);  Surgeon: Burnette Fallow, MD;  Location: [facility];  Service: Endoscopy;  Laterality: N/A;   ESOPHAGOGASTRODUODENOSCOPY (EGD) WITH PROPOFOL  N/A 08/02/2019   Procedure: ESOPHAGOGASTRODUODENOSCOPY (EGD) WITH PROPOFOL ;  Surgeon: Elicia Claw, MD;  Location: [facility];  Service: Gastroenterology;  Laterality: N/A;   ESOPHAGOGASTRODUODENOSCOPY (EGD) WITH PROPOFOL  N/A 12/23/2022   Procedure: ESOPHAGOGASTRODUODENOSCOPY (EGD) WITH PROPOFOL ;  Surgeon: San Sandor GAILS, DO;  Location: [facility] ENDOSCOPY;  Service: Gastroenterology;  Laterality: N/A;   FISTULA SUPERFICIALIZATION Left 12/31/2022   Procedure: LEFT ARM FISTULA TRANSPOSITION;  Surgeon: Sheree Penne Lonni, MD;  Location: [facility] OR;  Service: Vascular;  Laterality: Left;   GIVENS CAPSULE STUDY N/A 12/23/2022   Procedure: GIVENS CAPSULE STUDY;  Surgeon: San Sandor GAILS, DO;  Location: [facility] ENDOSCOPY;  Service:  Gastroenterology;  Laterality: N/A;   HEMOSTASIS CLIP PLACEMENT  08/02/2019   Procedure: HEMOSTASIS CLIP PLACEMENT;  Surgeon: Elicia Claw, MD;  Location: [facility];  Service: Gastroenterology;;   HOT HEMOSTASIS N/A 12/23/2022   Procedure: HOT HEMOSTASIS (ARGON PLASMA COAGULATION/BICAP);  Surgeon: San Sandor GAILS, DO;  Location: [facility] ENDOSCOPY;  Service: Gastroenterology;  Laterality: N/A;   IR FLUORO GUIDE CV LINE RIGHT  06/24/2022   IR US  GUIDE VASC ACCESS RIGHT  06/24/2022   POLYPECTOMY  07/30/2019   Procedure: POLYPECTOMY;  Surgeon: Elicia Claw, MD;  Location: [facility];  Service: Gastroenterology;;   POLYPECTOMY  08/02/2019   Procedure: POLYPECTOMY;  Surgeon: Elicia Claw, MD;  Location: [facility];  Service: Gastroenterology;;   STUMP REVISION Right 02/02/2023   Procedure: REVISION RIGHT BELOW KNEE AMPUTATION;  Surgeon: Harden Jerona GAILS, MD;  Location: [facility] OR;  Service: Orthopedics;  Laterality: Right;   Past Surgical History:  Procedure Laterality Date   ABDOMINAL AORTOGRAM W/LOWER EXTREMITY N/A 11/29/2022   Procedure: ABDOMINAL AORTOGRAM W/LOWER EXTREMITY;  Surgeon: Pearline Norman RAMAN, MD;  Location: [facility] INVASIVE CV LAB;  Service: Cardiovascular;  Laterality: N/A;   AMPUTATION Right 12/29/2022   Procedure: RIGHT BELOW KNEE AMPUTATION;  Surgeon: Harden Jerona GAILS, MD;  Location: [facility] OR;  Service: Orthopedics;  Laterality: Right;   AV FISTULA PLACEMENT Left 06/30/2022   Procedure: LEFT BRACHIOCEPHALIC ARTERIOVENOUS (AV) FISTULA CREATION;  Surgeon: Gretta Lonni PARAS, MD;  Location: [facility] OR;  Service: Vascular;  Laterality: Left;   BIOPSY  07/27/2019   Procedure: BIOPSY;  Surgeon: Rosalie Kitchens, MD;  Location: [facility];  Service: Endoscopy;;   BIOPSY  07/30/2019   Procedure: BIOPSY;  Surgeon: Elicia Claw, MD;  Location: [facility];  Service: Gastroenterology;;   CATARACT EXTRACTION  01/2014   CHOLECYSTECTOMY     COLONOSCOPY WITH PROPOFOL  N/A 07/30/2019   Procedure:  COLONOSCOPY WITH PROPOFOL ;  Surgeon: Elicia Claw, MD;  Location: [facility];  Service: Gastroenterology;  Laterality: N/A;   ESOPHAGOGASTRODUODENOSCOPY N/A 07/27/2019   Procedure: ESOPHAGOGASTRODUODENOSCOPY (EGD);  Surgeon: Rosalie Kitchens, MD;  Location: [facility];  Service: Endoscopy;  Laterality: N/A;   ESOPHAGOGASTRODUODENOSCOPY N/A 07/26/2020   Procedure: ESOPHAGOGASTRODUODENOSCOPY (EGD);  Surgeon: Burnette Fallow, MD;  Location: [facility];  Service: Endoscopy;  Laterality: N/A;   ESOPHAGOGASTRODUODENOSCOPY (EGD) WITH PROPOFOL  N/A 08/02/2019   Procedure: ESOPHAGOGASTRODUODENOSCOPY (EGD) WITH PROPOFOL ;  Surgeon: Elicia Claw, MD;  Location: [facility];  Service: Gastroenterology;  Laterality: N/A;   ESOPHAGOGASTRODUODENOSCOPY (EGD) WITH PROPOFOL  N/A 12/23/2022   Procedure: ESOPHAGOGASTRODUODENOSCOPY (EGD) WITH PROPOFOL ;  Surgeon: San Sandor GAILS, DO;  Location: [facility];  Service: Gastroenterology;  Laterality: N/A;   FISTULA SUPERFICIALIZATION Left 12/31/2022   Procedure: LEFT ARM FISTULA TRANSPOSITION;  Surgeon: Sheree Penne Lonni, MD;  Location: [facility] OR;  Service: Vascular;  Laterality: Left;   GIVENS CAPSULE STUDY N/A 12/23/2022   Procedure: GIVENS CAPSULE STUDY;  Surgeon: San Sandor GAILS, DO;  Location: [facility];  Service: Gastroenterology;  Laterality: N/A;   HEMOSTASIS CLIP PLACEMENT  08/02/2019   Procedure: HEMOSTASIS CLIP PLACEMENT;  Surgeon: Elicia Claw, MD;  Location: [facility];  Service: Gastroenterology;;   HOT HEMOSTASIS N/A 12/23/2022   Procedure: HOT HEMOSTASIS (ARGON PLASMA COAGULATION/BICAP);  Surgeon: San Sandor GAILS, DO;  Location: [facility] ENDOSCOPY;  Service: Gastroenterology;  Laterality: N/A;   IR FLUORO GUIDE CV LINE RIGHT  06/24/2022   IR US  GUIDE VASC ACCESS RIGHT  06/24/2022   POLYPECTOMY  07/30/2019   Procedure: POLYPECTOMY;  Surgeon: Elicia Claw, MD;  Location: [facility];  Service: Gastroenterology;;   POLYPECTOMY   08/02/2019   Procedure: POLYPECTOMY;  Surgeon: Elicia Claw, MD;  Location: [facility];  Service: Gastroenterology;;   STUMP REVISION Right 02/02/2023   Procedure: REVISION RIGHT BELOW KNEE AMPUTATION;  Surgeon: Harden Jerona GAILS, MD;  Location: [facility] OR;  Service: Orthopedics;  Laterality: Right;   Past Medical History:  Diagnosis Date   Acute back pain with sciatica, left    Acute back pain with sciatica, right    Acute encephalopathy 05/29/2022   Acute osteomyelitis of right calcaneus (HCC) 12/21/2022   AKI (acute kidney injury) (HCC)    Anemia, unspecified    Atrial fibrillation with RVR (HCC)-resolved 09/09/2022   Atypical chest pain 09/10/2021   Cancer (HCC)    Carcinoid tumor of duodenum    Chest pain with normal coronary angiography 2019   Chronic a-fib (HCC) 09/09/2022   Chronic kidney disease, stage 3b (HCC)    Chronic pain    Chronic systolic CHF (congestive heart failure) (HCC)    Dehiscence of amputation stump of right lower extremity (HCC) 01/29/2023  Diabetes mellitus    DKA (diabetic ketoacidosis) (HCC)    Drug-seeking behavior    21 hospitalizations and 14 CT a/p in 2 years for N/V and abdominal pain, demanding only IV dilaudid    Elevated troponin    chronic   Esophageal reflux    Fibromyalgia    Gastric ulcer    Gastroparesis    Gout    HCAP (healthcare-associated pneumonia) 06/19/2022   Hyperlipidemia    Hyperosmolar hyperglycemic state (HHS) (HCC) 05/11/2022   Hyperosmolar non-ketotic state due to type 2 diabetes mellitus (HCC) 05/11/2022   Hypertension    Hypokalemia    Hypomagnesemia    Lumbosacral stenosis    LVH (left ventricular hypertrophy)    Morbid obesity (HCC)    Nausea & vomiting 09/09/2022   NICM (nonischemic cardiomyopathy) (HCC)    PAF (paroxysmal atrial fibrillation) (HCC)    Sepsis (HCC) 11/23/2022   Stroke (HCC) 02/2011   Symptomatic anemia 12/14/2022   Thrombocytosis    Vitamin B12 deficiency anemia    BP 102/76   Ht 5' 6  (1.676 m)   Wt 242 lb 8.2 oz (110 kg) Comment: last recorded  LMP 10/10/2012   BMI 39.14 kg/m   Opioid Risk Score:   Fall Risk Score:  `1  Depression screen [facility] 2/9     03/16/2023   10:53 AM 11/18/2022    1:41 PM 09/14/2022    1:23 PM 07/20/2022    1:05 PM 09/16/2021    9:16 AM 06/01/2021    1:55 PM 08/01/2020    2:56 PM  Depression screen PHQ 2/9  Decreased Interest 3 3 1 3 3 1 1   Down, Depressed, Hopeless 3 3 1 2 3 1 1   PHQ - 2 Score 6 6 2 5 6 2 2   Altered sleeping  3       Tired, decreased energy  3       Change in appetite  3       Feeling bad or failure about yourself   3       Trouble concentrating  3       Moving slowly or fidgety/restless  3       Suicidal thoughts  3       PHQ-9 Score  27          Review of Systems  Constitutional: Negative.   HENT: Negative.    Eyes: Negative.   Respiratory: Negative.    Cardiovascular: Negative.   Gastrointestinal:  Positive for abdominal pain.  Endocrine: Negative.   Genitourinary: Negative.   Musculoskeletal:  Positive for back pain and gait problem.  Skin:  Positive for wound.       Right BKA done around a month ago by Dr Harden  Allergic/Immunologic: Negative.   Hematological: Negative.   Psychiatric/Behavioral:  Positive for dysphoric mood.   All other systems reviewed and are negative.      Objective:   Physical Exam Vitals and nursing note reviewed.  Constitutional:      Appearance: Normal appearance.  Neck:     Comments: Cervical Paraspinal Tenderness: C-5-C-6 Cardiovascular:     Rate and Rhythm: Rhythm irregular.  Pulmonary:     Effort: Pulmonary effort is normal.     Breath sounds: Normal breath sounds.  Musculoskeletal:     Comments: Normal Muscle Bulk and Muscle Testing Reveals:  Upper Extremities: Decreased ROM 30 Degrees and Muscle Strength  4/5 Bilateral AC Joint Tenderness Thoracic and  Lumbar Hypersensitivity Bilateral Greater Trochanter Tenderness Lower  Extremities : Right; BKA  Left Lower  Extremity with dressing intact, Ms. Deschepper refuses to Provider to change dressing.  Arrived in wheelchair     Skin:    General: Skin is warm and dry.  Neurological:     Mental Status: She is alert and oriented to person, place, and time.  Psychiatric:        Mood and Affect: Mood normal.        Behavior: Behavior normal.          Assessment & Plan:  1. 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 03/16/2023 2. Chronic Pain Syndrome: Fentanyl  Discontinued: Refilled: Dilaudid  4mg  one tablet every 8 hours as needed for pain #80. Second script sent for the following month. We will continue the opioid monitoring program, this consists of regular clinic visits, examinations, urine drug screen, pill counts as well as use of [place]  Controlled Substance Reporting system. A 12 month History has been reviewed on the [place]  Controlled Substance Reporting System Today.   Continue to Monitor. 03/16/2023 3. Chronic Bilateral Lower Back Pain without sciatica: Continue HEP as Tolerated. Continue to Monitor. 03/16/2023 4. Chronic Bilateral Shoulder Pain: Continue HEP as Tolerated. Continue to Monitor.  5. Polyarthralgia: Continue HEP as Tolerated. Continue to Monitor.   F/U in 2 months

## 2023-03-16 NOTE — Telephone Encounter (Signed)
 Gave Betsy VO okay

## 2023-03-16 NOTE — Telephone Encounter (Signed)
 Deborah [name] (PT) called from [facility] requesting new resumption of care starting 03/18/2023. Betsy secure number is [phone].

## 2023-03-16 NOTE — Telephone Encounter (Signed)
 Patient called in states she needs medication Dilaudid sent to [facility] on [address]

## 2023-03-18 ENCOUNTER — Telehealth: Payer: Self-pay | Admitting: Orthopedic Surgery

## 2023-03-18 NOTE — Telephone Encounter (Signed)
 Almira called. From [facility]. Patient has refused PT in home. They have yet to start home PT. Her cb# [phone]

## 2023-03-20 LAB — DRUG TOX MONITOR 1 W/CONF, ORAL FLD
Amphetamines: NEGATIVE ng/mL (ref <10)
Barbiturates: NEGATIVE ng/mL (ref <10)
Benzodiazepines: NEGATIVE ng/mL (ref <0.50)
Benzoylecgonine: NEGATIVE ng/mL (ref <5.0)
Buprenorphine: NEGATIVE ng/mL (ref <0.10)
Cocaine: 8 ng/mL — ABNORMAL HIGH (ref <5.0)
Cocaine: POSITIVE ng/mL — AB (ref <5.0)
Cotinine: 145.3 ng/mL — ABNORMAL HIGH (ref <5.0)
Fentanyl: 0.22 ng/mL — ABNORMAL HIGH (ref <0.10)
Fentanyl: POSITIVE ng/mL — AB (ref <0.10)
Heroin Metabolite: NEGATIVE ng/mL (ref <1.0)
MARIJUANA: POSITIVE ng/mL — AB (ref <2.5)
MDMA: NEGATIVE ng/mL (ref <10)
Meprobamate: NEGATIVE ng/mL (ref <2.5)
Methadone: NEGATIVE ng/mL (ref <5.0)
Nicotine Metabolite: POSITIVE ng/mL — AB (ref <5.0)
Opiates: NEGATIVE ng/mL (ref <2.5)
Phencyclidine: NEGATIVE ng/mL (ref <10)
THC: 98.2 ng/mL — ABNORMAL HIGH (ref <2.5)
Tapentadol: NEGATIVE ng/mL (ref <5.0)
Tramadol: NEGATIVE ng/mL (ref <5.0)
Zolpidem: NEGATIVE ng/mL (ref <5.0)

## 2023-03-20 LAB — DRUG TOX ALC METAB W/CON, ORAL FLD: Alcohol Metabolite: NEGATIVE ng/mL (ref <25)

## 2023-03-21 ENCOUNTER — Telehealth: Payer: Self-pay | Admitting: Registered Nurse

## 2023-03-21 MED ORDER — HYDROMORPHONE HCL 4 MG PO TABS: 4.0000 mg | ORAL_TABLET | Freq: Three times a day (TID) | ORAL | 0 refills | Status: DC | PRN

## 2023-03-21 NOTE — Telephone Encounter (Signed)
 Call Placed to Ms. Lusty regarding her Oral Swab, she denies using Cocaine or smoking Marijuana.  She was prescribed Fentanyl  post hospital admission, she was instructed to call [facility] lab.  This was discussed with Dr Lorilee and Manager Dedra Sensing, we will await toxicologist call.

## 2023-03-21 NOTE — Telephone Encounter (Signed)
 Patient states she only got 5 day supply on Dilaudid and needs the rest of the 30 day supply.

## 2023-03-21 NOTE — Telephone Encounter (Signed)
 Patient needing a refill on medication.  Please call patient.

## 2023-03-21 NOTE — Telephone Encounter (Signed)
 Dilaudid e-scribed x 1 week, awaiting Toxicologist return call.

## 2023-03-22 ENCOUNTER — Other Ambulatory Visit: Payer: Self-pay

## 2023-03-22 ENCOUNTER — Emergency Department (HOSPITAL_COMMUNITY): Payer: 59

## 2023-03-22 ENCOUNTER — Emergency Department (HOSPITAL_COMMUNITY)
Admission: EM | Admit: 2023-03-22 | Discharge: 2023-03-23 | Disposition: A | Discharge status: Home / Self Care | Payer: 59 | Attending: Emergency Medicine | Admitting: Emergency Medicine

## 2023-03-22 ENCOUNTER — Encounter (HOSPITAL_COMMUNITY): Payer: Self-pay | Admitting: Emergency Medicine

## 2023-03-22 DIAGNOSIS — R110 Nausea: Secondary | ICD-10-CM | POA: Diagnosis not present

## 2023-03-22 DIAGNOSIS — R109 Unspecified abdominal pain: Secondary | ICD-10-CM | POA: Insufficient documentation

## 2023-03-22 DIAGNOSIS — R5383 Other fatigue: Secondary | ICD-10-CM | POA: Insufficient documentation

## 2023-03-22 DIAGNOSIS — G8929 Other chronic pain: Secondary | ICD-10-CM

## 2023-03-22 DIAGNOSIS — R079 Chest pain, unspecified: Secondary | ICD-10-CM | POA: Insufficient documentation

## 2023-03-22 DIAGNOSIS — R197 Diarrhea, unspecified: Secondary | ICD-10-CM | POA: Insufficient documentation

## 2023-03-22 DIAGNOSIS — R638 Other symptoms and signs concerning food and fluid intake: Secondary | ICD-10-CM | POA: Diagnosis not present

## 2023-03-22 DIAGNOSIS — R6883 Chills (without fever): Secondary | ICD-10-CM | POA: Insufficient documentation

## 2023-03-22 DIAGNOSIS — E876 Hypokalemia: Secondary | ICD-10-CM

## 2023-03-22 LAB — HEPATIC FUNCTION PANEL
ALT: 8 U/L (ref 0–44)
AST: 11 U/L — ABNORMAL LOW (ref 15–41)
Albumin: 2.3 g/dL — ABNORMAL LOW (ref 3.5–5.0)
Alkaline Phosphatase: 81 U/L (ref 38–126)
Bilirubin, Direct: 0.2 mg/dL (ref 0.0–0.2)
Indirect Bilirubin: 0.4 mg/dL (ref 0.3–0.9)
Total Bilirubin: 0.6 mg/dL (ref 0.0–1.2)
Total Protein: 6.5 g/dL (ref 6.5–8.1)

## 2023-03-22 LAB — BASIC METABOLIC PANEL
Anion gap: 16 — ABNORMAL HIGH (ref 5–15)
BUN: 17 mg/dL (ref 6–20)
CO2: 21 mmol/L — ABNORMAL LOW (ref 22–32)
Calcium: 8 mg/dL — ABNORMAL LOW (ref 8.9–10.3)
Chloride: 100 mmol/L (ref 98–111)
Creatinine, Ser: 5.21 mg/dL — ABNORMAL HIGH (ref 0.44–1.00)
GFR, Estimated: 9 mL/min — ABNORMAL LOW (ref >60)
Glucose, Bld: 107 mg/dL — ABNORMAL HIGH (ref 70–99)
Potassium: 2.6 mmol/L — CL (ref 3.5–5.1)
Sodium: 137 mmol/L (ref 135–145)

## 2023-03-22 LAB — CBC
HCT: 25.7 % — ABNORMAL LOW (ref 36.0–46.0)
Hemoglobin: 8.1 g/dL — ABNORMAL LOW (ref 12.0–15.0)
MCH: 29.2 pg (ref 26.0–34.0)
MCHC: 31.5 g/dL (ref 30.0–36.0)
MCV: 92.8 fL (ref 80.0–100.0)
Platelets: 354 10*3/uL (ref 150–400)
RBC: 2.77 MIL/uL — ABNORMAL LOW (ref 3.87–5.11)
RDW: 15.9 % — ABNORMAL HIGH (ref 11.5–15.5)
WBC: 16 10*3/uL — ABNORMAL HIGH (ref 4.0–10.5)
nRBC: 0 % (ref 0.0–0.2)

## 2023-03-22 LAB — TROPONIN I (HIGH SENSITIVITY): Troponin I (High Sensitivity): 14 ng/L (ref <18)

## 2023-03-22 LAB — LIPASE, BLOOD: Lipase: 26 U/L (ref 11–51)

## 2023-03-22 LAB — PROTIME-INR
INR: 1.5 — ABNORMAL HIGH (ref 0.8–1.2)
Prothrombin Time: 17.9 s — ABNORMAL HIGH (ref 11.4–15.2)

## 2023-03-22 MED ORDER — HYDROMORPHONE HCL 2 MG PO TABS
4.0000 mg | ORAL_TABLET | Freq: Once | ORAL | Status: DC
Start: 2023-03-22 — End: 2023-03-23
  Filled 2023-03-22 (×2): qty 2

## 2023-03-22 MED ORDER — SODIUM CHLORIDE 0.9% FLUSH
3.0000 mL | Freq: Once | INTRAVENOUS | Status: AC
  Administered 2023-03-22: 3 mL via INTRAVENOUS

## 2023-03-22 MED ORDER — POTASSIUM CHLORIDE CRYS ER 20 MEQ PO TBCR
20.0000 meq | EXTENDED_RELEASE_TABLET | Freq: Once | ORAL | Status: DC
  Filled 2023-03-22 (×2): qty 1

## 2023-03-22 MED ORDER — HYDROMORPHONE HCL 1 MG/ML IJ SOLN
1.0000 mg | Freq: Once | INTRAMUSCULAR | Status: DC
  Filled 2023-03-22: qty 1

## 2023-03-22 MED ORDER — HYDROMORPHONE HCL 1 MG/ML IJ SOLN
1.0000 mg | Freq: Once | INTRAMUSCULAR | Status: AC
  Administered 2023-03-22: 1 mg via INTRAMUSCULAR
  Filled 2023-03-22: qty 1

## 2023-03-22 NOTE — ED Triage Notes (Signed)
 Patient brought in by EMS from home with center chest pain stating its pressure feels like someone with steping on her chest. Patient also c/o sharp lower abdominal pain. All symptoms started 1 hour prior to calling EMS. Patient is allergic to ASA and per EMS BP was low so Nitroglycerin  was given. Pain 10/10. Patient miss dialysis on Saturday however only had 1 hour of dialysis today due to her being sick.

## 2023-03-22 NOTE — ED Provider Notes (Signed)
 [facility] Provider Note   [name]: [id number] Arrival date & time: 03/22/23  1925     History  Chief Complaint  Patient presents with   Chest Pain    Deborah [name] is a 58 y.o. female.  HPI   58 year old female with past medical history of chronic ongoing abdominal pain, carcinoid tumor, diarrhea, chronic hypokalemia presents to the emergency department with concern for abdominal pain and chest pain.  Patient states that the pain began in the upper abdomen/chest.  She feels like it is now moved down to her lower abdomen.  She has had associated diarrhea.  She went to dialysis but secondary to chest/abdominal pain EMS was called.  She denies any fever, acute shortness of breath, productive cough.  She has a right BKA.  Home Medications Prior to Admission medications   Medication Sig Start Date End Date Taking? Authorizing Provider  albuterol  (PROVENTIL ) (2.5 MG/3ML) 0.083% nebulizer solution Take 3 mLs (2.5 mg total) by nebulization every 6 (six) hours as needed for wheezing or shortness of breath. 04/06/19   Arloa Suzen RAMAN, NP  allopurinol  (ZYLOPRIM ) 100 MG tablet Take 1 tablet (100 mg total) by mouth 2 (two) times daily. 11/30/22   Lue Elsie BROCKS, MD  ascorbic acid  (VITAMIN C ) 500 MG tablet Take 500 mg by mouth daily.    [provider]  atorvastatin  (LIPITOR) 10 MG tablet Take 10 mg by mouth every evening. 12/03/21   [provider]  busPIRone  (BUSPAR ) 5 MG tablet Take 5 mg by mouth 3 (three) times daily.    [provider]  cetirizine  (ZYRTEC ) 10 MG tablet Take 10 mg by mouth daily. 08/05/22   [provider]  famotidine  (PEPCID ) 20 MG tablet Take 20 mg by mouth daily before breakfast.    [provider]  ferric citrate  (AURYXIA ) 1 GM 210 MG(Fe) tablet Take 2 tablets (420 mg total) by mouth 3 (three) times daily with meals. Patient taking differently: Take 210 mg by mouth 3 (three)  times daily with meals. 11/30/22   Lue Elsie BROCKS, MD  fluticasone  (FLONASE ) 50 MCG/ACT nasal spray Place 2 sprays into both nostrils daily as needed for allergies or rhinitis. 12/19/18   Rai, Ripudeep MARLA, MD  folic acid  (FOLVITE ) 1 MG tablet Take 1 tablet (1 mg total) by mouth daily. 01/10/23   Danford, Lonni SQUIBB, MD  gabapentin  (NEURONTIN ) 100 MG capsule Take 1 capsule (100 mg total) by mouth 3 (three) times daily. 11/30/22   Lue Elsie BROCKS, MD  HYDROmorphone  (DILAUDID ) 4 MG tablet Take 1 tablet (4 mg total) by mouth every 8 (eight) hours as needed for moderate pain (pain score 4-6). 03/21/23   Debby Fidela CROME, NP  hydrOXYzine  (ATARAX ) 25 MG tablet Take 1 tablet (25 mg total) by mouth 3 (three) times daily as needed for anxiety. Patient taking differently: Take 25 mg by mouth See admin instructions. Give 25 mg (1 tablet) every 6 hours as needed for anxiety, agitation for 14 days (starting 11/19), then every 8 hours as needed. 01/10/23   Danford, Lonni SQUIBB, MD  insulin  lispro (HUMALOG ) 100 UNIT/ML KwikPen Before each meal 3 times a day, 140-199 - 2 units, 200-250 - 6 units, 251-299 - 8 units,  300-349 - 12 units,  350 or above 14 units. Patient taking differently: Inject 0-14 Units into the skin See admin instructions. Inject 0-14 units per sliding scale before meals: < 70 notify MD 70-139 : 0 units 140-199 :  2 units 200-250 : 6 units 251-299 : 8 units 300-349 : 12 units 350-400 : 14 units > 401 notify MD. 06/04/22   Singh, Prashant K, MD  Lactobacillus (ACIDOPHILUS) CAPS capsule Take 1 capsule by mouth 3 (three) times daily with meals. 03/08/23   Elgergawy, Dawood S, MD  leptospermum manuka honey (MEDIHONEY) PSTE paste Apply 1 Application topically daily. 03/08/23   Elgergawy, Brayton RAMAN, MD  loperamide  (IMODIUM ) 2 MG capsule Take 1 capsule (2 mg total) by mouth as needed for diarrhea or loose stools. 03/08/23   Elgergawy, Brayton RAMAN, MD  methocarbamol  (ROBAXIN ) 500 MG tablet Take 1  tablet (500 mg total) by mouth every 6 (six) hours as needed for muscle spasms. 01/10/23   Danford, Lonni SQUIBB, MD  metoprolol  tartrate (LOPRESSOR ) 25 MG tablet Take 1 tablet (25 mg total) by mouth 2 (two) times daily. 03/08/23   Elgergawy, Brayton RAMAN, MD  midodrine  (PROAMATINE ) 5 MG tablet Take 5 mg by mouth 3 (three) times daily. 02/18/23   [provider]  mirtazapine  (REMERON ) 15 MG tablet Take 15 mg by mouth at bedtime. 10/18/22   [provider]  Multiple Vitamins-Minerals (SENIOR MULTIVITAMIN PLUS PO) Take 1 tablet by mouth daily.    [provider]  nutrition supplement, JUVEN, (JUVEN) PACK Take 1 packet by mouth 2 (two) times daily between meals. 01/10/23   Danford, Lonni SQUIBB, MD  Nutritional Supplements (,FEEDING SUPPLEMENT, PROSOURCE PLUS) liquid Take 30 mLs by mouth 2 (two) times daily between meals. 01/10/23   Danford, Lonni SQUIBB, MD  pantoprazole  (PROTONIX ) 40 MG tablet Take 1 tablet (40 mg total) by mouth 2 (two) times daily. 02/18/23   Raulkar, Sven SQUIBB, MD  torsemide  (DEMADEX ) 100 MG tablet Take 50 mg by mouth 2 (two) times daily. 02/18/23   [provider]      Allergies    Isovue  [iopamidol ], Nsaids, Penicillins, Reglan  [metoclopramide ], Valium  [diazepam ], Zestril [lisinopril], Tolectin [tolmetin], Asa [aspirin ], Aspartame and phenylalanine, Bentyl  [dicyclomine ], Hibiclens  [chlorhexidine  gluconate], Flexeril  [cyclobenzaprine ], Oxycontin  [oxycodone ], Rifamycins, Tylenol  [acetaminophen ], and Ultram  [tramadol ]    Review of Systems   Review of Systems  Constitutional:  Positive for appetite change, chills and fatigue. Negative for fever.  Respiratory:  Positive for chest tightness. Negative for shortness of breath.   Cardiovascular:  Positive for chest pain. Negative for palpitations and leg swelling.  Gastrointestinal:  Positive for abdominal pain, diarrhea and nausea. Negative for vomiting.  Skin:  Negative for rash.  Neurological:   Negative for headaches.    Physical Exam Updated Vital Signs BP 114/72 (BP Location: Right Wrist)   Pulse 95   Temp 98.3 F (36.8 C) (Oral)   Resp 12   Ht 5' 6 (1.676 m)   Wt 113.4 kg   LMP 10/10/2012   SpO2 100%   BMI 40.35 kg/m  Physical Exam Vitals and nursing note reviewed.  Constitutional:      Appearance: Normal appearance.  HENT:     Head: Normocephalic.     Mouth/Throat:     Mouth: Mucous membranes are moist.  Cardiovascular:     Rate and Rhythm: Normal rate.  Pulmonary:     Effort: Pulmonary effort is normal. No respiratory distress.  Abdominal:     Palpations: Abdomen is soft.     Tenderness: There is no abdominal tenderness.  Skin:    General: Skin is warm.  Neurological:     Mental Status: She is alert and oriented to person, place, and time. Mental status is at baseline.  Psychiatric:        Mood and Affect: Mood normal.     ED Results / Procedures / Treatments   Labs (all labs ordered are listed, but only abnormal results are displayed) Labs Reviewed  BASIC METABOLIC PANEL - Abnormal; Notable for the following components:      Result Value   Potassium 2.6 (*)    CO2 21 (*)    Glucose, Bld 107 (*)    Creatinine, Ser 5.21 (*)    Calcium  8.0 (*)    GFR, Estimated 9 (*)    Anion gap 16 (*)    All other components within normal limits  CBC - Abnormal; Notable for the following components:   WBC 16.0 (*)    RBC 2.77 (*)    Hemoglobin 8.1 (*)    HCT 25.7 (*)    RDW 15.9 (*)    All other components within normal limits  PROTIME-INR - Abnormal; Notable for the following components:   Prothrombin Time 17.9 (*)    INR 1.5 (*)    All other components within normal limits  HEPATIC FUNCTION PANEL  LIPASE, BLOOD  TROPONIN I (HIGH SENSITIVITY)    EKG EKG Interpretation Date/Time:  Tuesday March 22 2023 19:26:41 EST Ventricular Rate:  98 PR Interval:  181 QRS Duration:  95 QT Interval:  388 QTC Calculation: 496 R Axis:   -50  Text  Interpretation: Sinus rhythm Ventricular premature complex Left anterior fascicular block Abnormal R-wave progression, late transition Nonspecific T abnormalities, lateral leads Borderline prolonged QT interval Similar to previous Confirmed by Bari Flank [phone]) on 03/22/2023 7:39:18 PM  Radiology DG Chest 2 View Result Date: 03/22/2023 CLINICAL DATA:  Chest pain EXAM: CHEST - 2 VIEW COMPARISON:  02/28/2023 FINDINGS: Right-sided central venous catheter tip at the SVC. No acute airspace disease or effusion. Normal cardiac size. No pneumothorax. IMPRESSION: No active cardiopulmonary disease. Electronically Signed   By: Luke Bun M.D.   On: 03/22/2023 21:06    Procedures Procedures    Medications Ordered in ED Medications  sodium chloride  flush (NS) 0.9 % injection 3 mL (has no administration in time range)  HYDROmorphone  (DILAUDID ) injection 1 mg (has no administration in time range)  HYDROmorphone  (DILAUDID ) injection 1 mg (1 mg Intramuscular Given 03/22/23 2150)    ED Course/ Medical Decision Making/ A&P                                 Medical Decision Making Amount and/or Complexity of Data Reviewed Labs: ordered. Radiology: ordered.  Risk Prescription drug management.   58 year old female presents emergency department with chest and abdominal pain.  She has history of abdominal pain in the past, this feels similar but more sharp.  Was discharged a couple weeks ago after admission for abdominal pain, diarrhea, chronic hypokalemia.  Of note she did not finish her full dialysis session today secondary to pain.  Vitals are normal and stable on arrival.  EKG does not look acutely changed from an ischemic standpoint.  Blood work shows a baseline leukocytosis and anemia as well as creatinine.  Potassium today is 2.6, QTc is almost 500.  Initial troponin is negative.  Abdominal labs are reassuring.  Chest x-ray shows no acute finding.  Patient is pending CT of the chest, abdomen  and pelvis.  She has an anaphylactic contrast allergy so this was done without.        Final Clinical Impression(s) /  ED Diagnoses Final diagnoses:  None    Rx / DC Orders ED Discharge Orders     None         Bari Roxie HERO, DO 03/22/23 2316

## 2023-03-23 ENCOUNTER — Encounter: Payer: 59 | Admitting: Family

## 2023-03-23 ENCOUNTER — Telehealth: Payer: Self-pay | Admitting: *Deleted

## 2023-03-23 MED ORDER — ONDANSETRON 4 MG PO TBDP: 4.0000 mg | ORAL_TABLET | Freq: Three times a day (TID) | ORAL | 0 refills | Status: AC | PRN

## 2023-03-23 MED ORDER — ONDANSETRON 4 MG PO TBDP
4.0000 mg | ORAL_TABLET | Freq: Once | ORAL | Status: DC
  Filled 2023-03-23: qty 1

## 2023-03-23 NOTE — ED Notes (Signed)
 Ptar called

## 2023-03-23 NOTE — ED Notes (Signed)
Patient was given a coke with cup of ice.

## 2023-03-23 NOTE — ED Provider Notes (Signed)
 I assumed care of this patient from previous provider.  Please see their note for further details of history, exam, and MDM.   Briefly patient is a 58 y.o. female who presented with abdominal pain thought to be exacerbation of chronic abdominal pain.  Patient reportedly missed dialysis on Saturday and went today for only 1 hour.  Noted to have hypokalemia lower than her baseline.  Currently awaiting CT scan and consultation to nephrology.  I spoke with Dr. Christianne Cowper from nephrology who recommended giving patient 20 mEq of potassium. CT scan was negative Patient was able to tolerate p.o. with me in the room.  Offered home dose of p.o. Dilaudid  but patient is refusing.  She wants medicine through the IV.  Concern for drug-seeking behavior.  The patient appears reasonably screened and/or stabilized for discharge and I doubt any other medical condition or other [facility] requiring further screening, evaluation, or treatment in the ED at this time. I have discussed the findings, Dx and Tx plan with the patient/family who expressed understanding and agree(s) with the plan. Discharge instructions discussed at length. The patient/family was given strict return precautions who verbalized understanding of the instructions. No further questions at time of discharge.  Disposition: Discharge  Condition: Good  ED Discharge Orders          Ordered    ondansetron  (ZOFRAN -ODT) 4 MG disintegrating tablet  Every 8 hours PRN        03/23/23 0025              Follow Up: Primary care provider  Call  to schedule an appointment for close follow up  Dialysis Center  Go to  as scheduled         Jamesyn Moorefield, Camila Cecil, MD 03/23/23 [phone]

## 2023-03-23 NOTE — Telephone Encounter (Signed)
 I spoke with Dr Kathlyn Ellen about the results from the oral swab. The test results were quantified and the metabolites were there. She was going to have the lab run the test again if there was sufficient quantity. They will  call back with results.

## 2023-03-28 ENCOUNTER — Telehealth: Payer: Self-pay | Admitting: Registered Nurse

## 2023-03-28 ENCOUNTER — Encounter: Payer: Self-pay | Admitting: Registered Nurse

## 2023-03-28 NOTE — Telephone Encounter (Signed)
Spoke to Dr. Alvino Chapel from [facility] and confirmed lab testing did not present no questionable evidence. Test was run again and results were the same.

## 2023-03-28 NOTE — Telephone Encounter (Signed)
Claudean Kinds Manager spoke with Toxicologist from [employer], based on the toxicologist the results remain the same.  Call placed to Ms. Lehl, she will be given a wean prescription of her Dilaudid, she is discharged from our office. Dr Carlis Abbott will be informed of the above.  Deborah [name] denies using Marijuana or Cocaine, she was instructed to call [facility] lab to speak with toxicologist. She verbalizes understanding.  The wean recommendation : Message sent to Dr Carlis Abbott awaiting her response.

## 2023-03-29 ENCOUNTER — Telehealth: Payer: Self-pay | Admitting: Registered Nurse

## 2023-03-29 MED ORDER — HYDROMORPHONE HCL 2 MG PO TABS: ORAL_TABLET | ORAL | 0 refills | Status: DC

## 2023-03-29 NOTE — Telephone Encounter (Signed)
Patient called in states she sent her nurse to [facility] for rx Dilaudid and they informed her they had not received it , patient would like medication sent to [facility] on [address]

## 2023-03-29 NOTE — Telephone Encounter (Signed)
Wean Prescription instruction was discussed with Dr Carlis Abbott she agrees with wean instructions.  Ms. Reule will be discharged from our office due to Oral Swab results. Ms. Hendler is aware of the above and verbalize understanding.

## 2023-03-29 NOTE — Telephone Encounter (Signed)
Rx was sent this morning. Patient informed.

## 2023-03-29 NOTE — Telephone Encounter (Signed)
Rx was transmitted this morning. Patient advised to check with pharmacy after lunch for pick up.

## 2023-03-30 ENCOUNTER — Encounter: Payer: 59 | Admitting: Family

## 2023-04-06 ENCOUNTER — Ambulatory Visit (INDEPENDENT_AMBULATORY_CARE_PROVIDER_SITE_OTHER): Payer: 59 | Admitting: Family

## 2023-04-06 ENCOUNTER — Encounter: Payer: Self-pay | Admitting: Family

## 2023-04-06 DIAGNOSIS — Z89511 Acquired absence of right leg below knee: Secondary | ICD-10-CM

## 2023-04-06 DIAGNOSIS — L97421 Non-pressure chronic ulcer of left heel and midfoot limited to breakdown of skin: Secondary | ICD-10-CM | POA: Diagnosis not present

## 2023-04-06 NOTE — Progress Notes (Signed)
Office Visit Note   Patient: Deborah [name]           Date of Birth: 23-Nov-1965           MRN: [id number] Visit Date: 04/06/2023              Requested by: No referring provider defined for this encounter. PCP: Pcp, No  Chief Complaint  Patient presents with   Right Leg - Routine Post Op    02/02/2023 revision right BKA       HPI: The patient is a 58 year old woman who is seen in follow-up for 2 separate issues she is status post revision right below-knee amputation November 27 of last year this is her first postop appointment.  She does have a shrinker which she has been wearing around-the-clock.  Has no concerns of the right residual limb  On examination left lower extremity she also has a lateral heel ulcer she has been applying Medihoney after cleansing with Vashe.  She states she has run out of Vashe.  Patient is a new right transtibial  amputee.  Patient's current comorbidities are not expected to impact the ability to function with the prescribed prosthesis. Patient verbally communicates a strong desire to use a prosthesis. Patient currently requires mobility aids to ambulate without a prosthesis.  Expects not to use mobility aids with a new prosthesis.  Patient is a K2 level ambulator that will use a prosthesis to walk around their home and the community over low level environmental barriers.     Assessment & Plan: Visit Diagnoses: No diagnosis found.  Plan: Given a new bottle of Vashe discussed wound care for the left lateral heel ulcer.  She may proceed with prosthesis set up for the right.  Follow-Up Instructions: Return in about 22 days (around 04/28/2023).   Ortho Exam  Patient is alert, oriented, no adenopathy, well-dressed, normal affect, normal respiratory effort. On examination right residual limb her below-knee amputation is well-healed well consolidated there is no open area no erythema.  On examination left lower extremity she does have  significant chronic edema there is no hemosiderin staining no weeping or ulcerations To the lateral heel she has a 1 cm in diameter ulceration which is filled in with necrotic tissue this is debrided with a 10 blade knife today ulcer is 3 mm deep with no surrounding maceration no active drainage does not probe Imaging: No results found.   Labs: Lab Results  Component Value Date   HGBA1C 5.7 (H) 02/04/2023   HGBA1C 8.1 (H) 06/19/2022   HGBA1C 10.1 (H) 03/21/2022   ESRSEDRATE 90 (H) 01/29/2023   ESRSEDRATE 119 (H) 12/21/2022   ESRSEDRATE 122 (H) 11/24/2022   CRP 5.9 (H) 02/11/2023   CRP 8.0 (H) 02/10/2023   CRP 8.2 (H) 02/09/2023   LABURIC 6.5 02/06/2023   LABURIC 6.7 11/28/2022   LABURIC 6.2 07/04/2022   REPTSTATUS 02/22/2023 FINAL 02/17/2023   CULT  02/17/2023    NO GROWTH 5 DAYS Performed at [facility], [address], [place], [place] [address]    LABORGA ESCHERICHIA COLI (A) 08/15/2021     Lab Results  Component Value Date   ALBUMIN 2.3 (L) 03/22/2023   ALBUMIN 2.1 (L) 03/08/2023   ALBUMIN 2.6 (L) 03/05/2023    Lab Results  Component Value Date   MG 1.5 (L) 02/06/2023   MG 1.8 12/22/2022   MG 1.5 (L) 12/20/2022   No results found for: "VD25OH"  No results found for: "PREALBUMIN"  Latest Ref Rng & Units 03/22/2023    8:05 PM 03/08/2023    1:45 PM 03/05/2023    2:28 AM  CBC EXTENDED  WBC 4.0 - 10.5 K/uL 16.0  18.1  18.4   RBC 3.87 - 5.11 MIL/uL 2.77  2.74  3.28   Hemoglobin 12.0 - 15.0 g/dL 8.1  8.0  9.5   HCT 16.1 - 46.0 % 25.7  25.5  30.9   Platelets 150 - 400 K/uL 354  274  320      There is no height or weight on file to calculate BMI.  Orders:  No orders of the defined types were placed in this encounter.  No orders of the defined types were placed in this encounter.    Procedures: No procedures performed  Clinical Data: No additional findings.  ROS:  All other systems negative, except as noted in the HPI. Review of  Systems  Objective: Vital Signs: LMP 10/10/2012   Specialty Comments:  No specialty comments available.  PMFS History: Patient Active Problem List   Diagnosis Date Noted   Diarrhea 03/01/2023   Intractable diarrhea 02/28/2023   Sinus tachycardia 02/17/2023   Cellulitis of heel, left 02/17/2023   Right below-knee amputee (HCC) 01/31/2023   Tardive dyskinesia 01/06/2023   Peutz-Jeghers polyps of small bowel (HCC) 12/24/2022   Gastric AVM 12/23/2022   Pressure injury of skin 11/30/2022   Non-pressure chronic ulcer of right heel and midfoot limited to breakdown of skin (HCC) 11/24/2022   End stage renal disease on dialysis (HCC) 09/09/2022   Peripheral neuropathy 09/09/2022   (HFpEF) heart failure with preserved ejection fraction (HCC) 09/09/2022   CHF (congestive heart failure) (HCC) 07/13/2022   Hyponatremia 05/29/2022   Leukocytosis 05/11/2022   Edema of left upper extremity 03/21/2022   Acute pyelonephritis    PAF (paroxysmal atrial fibrillation) (HCC)    Chronic pain 09/04/2019   Malignant carcinoid tumor of duodenum (HCC)    NASH (nonalcoholic steatohepatitis) 06/05/2019   Chronic diarrhea    Restrictive lung disease secondary to obesity    History of gastric ulcer    Fibroid uterus 02/23/2019   Abdominal pain 07/17/2018   Anxiety 11/29/2017   Spinal stenosis, lumbar region with neurogenic claudication 08/03/2017   GERD (gastroesophageal reflux disease) 03/19/2017   Depression 03/19/2017   Morbid obesity (HCC)    Normocytic anemia 08/16/2016   Diabetic gastroparesis (HCC) 08/16/2016   Gout 06/05/2016   Chronic hypokalemia 09/26/2015   Hypomagnesemia and hypokalemia 09/26/2015   Insulin dependent type 2 diabetes mellitus (HCC) 05/25/2015   Essential hypertension 09/28/2013   Past Medical History:  Diagnosis Date   Acute back pain with sciatica, left    Acute back pain with sciatica, right    Acute encephalopathy 05/29/2022   Acute osteomyelitis of right  calcaneus (HCC) 12/21/2022   AKI (acute kidney injury) (HCC)    Anemia, unspecified    Atrial fibrillation with RVR (HCC)-resolved 09/09/2022   Atypical chest pain 09/10/2021   Cancer (HCC)    Carcinoid tumor of duodenum    Chest pain with normal coronary angiography 2019   Chronic a-fib (HCC) 09/09/2022   Chronic kidney disease, stage 3b (HCC)    Chronic pain    Chronic systolic CHF (congestive heart failure) (HCC)    Dehiscence of amputation stump of right lower extremity (HCC) 01/29/2023   Diabetes mellitus    DKA (diabetic ketoacidosis) (HCC)    Drug-seeking behavior    21 hospitalizations and 14 CT a/p in 2 years  for N/V and abdominal pain, demanding only IV dilaudid   Elevated troponin    chronic   Esophageal reflux    Fibromyalgia    Gastric ulcer    Gastroparesis    Gout    HCAP (healthcare-associated pneumonia) 06/19/2022   Hyperlipidemia    Hyperosmolar hyperglycemic state (HHS) (HCC) 05/11/2022   Hyperosmolar non-ketotic state due to type 2 diabetes mellitus (HCC) 05/11/2022   Hypertension    Hypokalemia    Hypomagnesemia    Lumbosacral stenosis    LVH (left ventricular hypertrophy)    Morbid obesity (HCC)    Nausea & vomiting 09/09/2022   NICM (nonischemic cardiomyopathy) (HCC)    PAF (paroxysmal atrial fibrillation) (HCC)    Sepsis (HCC) 11/23/2022   Stroke (HCC) 02/2011   Symptomatic anemia 12/14/2022   Thrombocytosis    Vitamin B12 deficiency anemia     Family History  Problem Relation Age of Onset   Diabetes Mother    Diabetes Father    Heart disease Father    Diabetes Sister    Congestive Heart Failure Sister 37   Diabetes Brother     Past Surgical History:  Procedure Laterality Date   ABDOMINAL AORTOGRAM W/LOWER EXTREMITY N/A 11/29/2022   Procedure: ABDOMINAL AORTOGRAM W/LOWER EXTREMITY;  Surgeon: Daria Pastures, MD;  Location: [facility];  Service: Cardiovascular;  Laterality: N/A;   AMPUTATION Right 12/29/2022   Procedure: RIGHT  BELOW KNEE AMPUTATION;  Surgeon: Nadara Mustard, MD;  Location[facility];  Service: Orthopedics;  Laterality: Right;   AV FISTULA PLACEMENT Left 06/30/2022   Procedure: LEFT BRACHIOCEPHALIC ARTERIOVENOUS (AV) FISTULA CREATION;  Surgeon: Cephus Shelling, MD;  Location: [facility] OR;  Service: Vascular;  Laterality: Left;   BIOPSY  07/27/2019   Procedure: BIOPSY;  Surgeon: Vida Rigger, MD;  Location: [facility];  Service: Endoscopy;;   BIOPSY  07/30/2019   Procedure: BIOPSY;  Surgeon: Kathi Der, MD;  Location: [facility];  Service: Gastroenterology;;   CATARACT EXTRACTION  01/2014   CHOLECYSTECTOMY     COLONOSCOPY WITH PROPOFOL N/A 07/30/2019   Procedure: COLONOSCOPY WITH PROPOFOL;  Surgeon: Kathi Der, MD;  Location: [facility];  Service: Gastroenterology;  Laterality: N/A;   ESOPHAGOGASTRODUODENOSCOPY N/A 07/27/2019   Procedure: ESOPHAGOGASTRODUODENOSCOPY (EGD);  Surgeon: Vida Rigger, MD;  Location: [facility];  Service: Endoscopy;  Laterality: N/A;   ESOPHAGOGASTRODUODENOSCOPY N/A 07/26/2020   Procedure: ESOPHAGOGASTRODUODENOSCOPY (EGD);  Surgeon: Willis Modena, MD;  Location: [facility];  Service: Endoscopy;  Laterality: N/A;   ESOPHAGOGASTRODUODENOSCOPY (EGD) WITH PROPOFOL N/A 08/02/2019   Procedure: ESOPHAGOGASTRODUODENOSCOPY (EGD) WITH PROPOFOL;  Surgeon: Kathi Der, MD;  Location: [facility];  Service: Gastroenterology;  Laterality: N/A;   ESOPHAGOGASTRODUODENOSCOPY (EGD) WITH PROPOFOL N/A 12/23/2022   Procedure: ESOPHAGOGASTRODUODENOSCOPY (EGD) WITH PROPOFOL;  Surgeon: Shellia Cleverly, DO;  Location: [facility];  Service: Gastroenterology;  Laterality: N/A;   FISTULA SUPERFICIALIZATION Left 12/31/2022   Procedure: LEFT ARM FISTULA TRANSPOSITION;  Surgeon: Maeola Harman, MD;  Location: [facility] OR;  Service: Vascular;  Laterality: Left;   GIVENS CAPSULE STUDY N/A 12/23/2022   Procedure: GIVENS CAPSULE STUDY;  Surgeon: Shellia Cleverly, DO;  Location:  [facility];  Service: Gastroenterology;  Laterality: N/A;   HEMOSTASIS CLIP PLACEMENT  08/02/2019   Procedure: HEMOSTASIS CLIP PLACEMENT;  Surgeon: Kathi Der, MD;  Location: [facility];  Service: Gastroenterology;;   HOT HEMOSTASIS N/A 12/23/2022   Procedure: HOT HEMOSTASIS (ARGON PLASMA COAGULATION/BICAP);  Surgeon: Shellia Cleverly, DO;  Location: [facility];  Service: Gastroenterology;  Laterality:  N/A;   IR FLUORO GUIDE CV LINE RIGHT  06/24/2022   IR US GUIDE VASC ACCESS RIGHT  06/24/2022   POLYPECTOMY  07/30/2019   Procedure: POLYPECTOMY;  Surgeon: Kathi Der, MD;  Location: [facility];  Service: Gastroenterology;;   POLYPECTOMY  08/02/2019   Procedure: POLYPECTOMY;  Surgeon: Kathi Der, MD;  Location: [facility];  Service: Gastroenterology;;   STUMP REVISION Right 02/02/2023   Procedure: REVISION RIGHT BELOW KNEE AMPUTATION;  Surgeon: Nadara Mustard, MD;  Location: [facility] OR;  Service: Orthopedics;  Laterality: Right;   Social History   Occupational History   Not on file  Tobacco Use   Smoking status: Never   Smokeless tobacco: Never  Vaping Use   Vaping status: Never Used  Substance and Sexual Activity   Alcohol use: No   Drug use: No   Sexual activity: Not Currently    Birth control/protection: None

## 2023-04-06 NOTE — Telephone Encounter (Signed)
Discharge letter sent through MyChart and [employer].

## 2023-04-15 ENCOUNTER — Other Ambulatory Visit: Payer: Self-pay

## 2023-04-15 DIAGNOSIS — N184 Chronic kidney disease, stage 4 (severe): Secondary | ICD-10-CM

## 2023-04-22 ENCOUNTER — Ambulatory Visit: Payer: 59 | Admitting: Nurse Practitioner

## 2023-04-25 ENCOUNTER — Emergency Department (HOSPITAL_COMMUNITY): Payer: 59

## 2023-04-25 ENCOUNTER — Encounter (HOSPITAL_COMMUNITY): Payer: Self-pay

## 2023-04-25 ENCOUNTER — Other Ambulatory Visit: Payer: Self-pay

## 2023-04-25 ENCOUNTER — Inpatient Hospital Stay (HOSPITAL_COMMUNITY)
Admission: EM | Admit: 2023-04-25 | Discharge: 2023-05-04 | DRG: 291 | Disposition: A | Discharge status: Skilled Nursing Facility | Payer: 59 | Attending: Internal Medicine | Admitting: Internal Medicine

## 2023-04-25 ENCOUNTER — Ambulatory Visit: Payer: 59 | Admitting: Orthopedic Surgery

## 2023-04-25 DIAGNOSIS — D3A098 Benign carcinoid tumors of other sites: Secondary | ICD-10-CM | POA: Insufficient documentation

## 2023-04-25 DIAGNOSIS — Z6838 Body mass index (BMI) 38.0-38.9, adult: Secondary | ICD-10-CM

## 2023-04-25 DIAGNOSIS — F603 Borderline personality disorder: Secondary | ICD-10-CM | POA: Diagnosis present

## 2023-04-25 DIAGNOSIS — E1122 Type 2 diabetes mellitus with diabetic chronic kidney disease: Secondary | ICD-10-CM | POA: Diagnosis present

## 2023-04-25 DIAGNOSIS — Z8739 Personal history of other diseases of the musculoskeletal system and connective tissue: Secondary | ICD-10-CM

## 2023-04-25 DIAGNOSIS — Z880 Allergy status to penicillin: Secondary | ICD-10-CM

## 2023-04-25 DIAGNOSIS — Z89511 Acquired absence of right leg below knee: Secondary | ICD-10-CM | POA: Diagnosis not present

## 2023-04-25 DIAGNOSIS — M1A00X1 Idiopathic chronic gout, unspecified site, with tophus (tophi): Secondary | ICD-10-CM

## 2023-04-25 DIAGNOSIS — L97422 Non-pressure chronic ulcer of left heel and midfoot with fat layer exposed: Secondary | ICD-10-CM | POA: Diagnosis present

## 2023-04-25 DIAGNOSIS — I480 Paroxysmal atrial fibrillation: Secondary | ICD-10-CM | POA: Diagnosis present

## 2023-04-25 DIAGNOSIS — I493 Ventricular premature depolarization: Secondary | ICD-10-CM | POA: Diagnosis present

## 2023-04-25 DIAGNOSIS — E8779 Other fluid overload: Secondary | ICD-10-CM

## 2023-04-25 DIAGNOSIS — K2980 Duodenitis without bleeding: Secondary | ICD-10-CM | POA: Diagnosis not present

## 2023-04-25 DIAGNOSIS — Z91041 Radiographic dye allergy status: Secondary | ICD-10-CM

## 2023-04-25 DIAGNOSIS — Z886 Allergy status to analgesic agent status: Secondary | ICD-10-CM

## 2023-04-25 DIAGNOSIS — E11621 Type 2 diabetes mellitus with foot ulcer: Secondary | ICD-10-CM | POA: Insufficient documentation

## 2023-04-25 DIAGNOSIS — Z91158 Patient's noncompliance with renal dialysis for other reason: Secondary | ICD-10-CM | POA: Diagnosis not present

## 2023-04-25 DIAGNOSIS — K3184 Gastroparesis: Secondary | ICD-10-CM | POA: Diagnosis present

## 2023-04-25 DIAGNOSIS — I428 Other cardiomyopathies: Secondary | ICD-10-CM | POA: Diagnosis present

## 2023-04-25 DIAGNOSIS — L97521 Non-pressure chronic ulcer of other part of left foot limited to breakdown of skin: Secondary | ICD-10-CM

## 2023-04-25 DIAGNOSIS — T8249XA Other complication of vascular dialysis catheter, initial encounter: Secondary | ICD-10-CM | POA: Diagnosis not present

## 2023-04-25 DIAGNOSIS — R1310 Dysphagia, unspecified: Secondary | ICD-10-CM | POA: Diagnosis present

## 2023-04-25 DIAGNOSIS — Z992 Dependence on renal dialysis: Secondary | ICD-10-CM | POA: Diagnosis not present

## 2023-04-25 DIAGNOSIS — G894 Chronic pain syndrome: Secondary | ICD-10-CM | POA: Diagnosis not present

## 2023-04-25 DIAGNOSIS — R52 Pain, unspecified: Secondary | ICD-10-CM | POA: Diagnosis not present

## 2023-04-25 DIAGNOSIS — Z5982 Transportation insecurity: Secondary | ICD-10-CM

## 2023-04-25 DIAGNOSIS — I9589 Other hypotension: Secondary | ICD-10-CM | POA: Diagnosis present

## 2023-04-25 DIAGNOSIS — F32A Depression, unspecified: Secondary | ICD-10-CM | POA: Diagnosis present

## 2023-04-25 DIAGNOSIS — Z8709 Personal history of other diseases of the respiratory system: Secondary | ICD-10-CM

## 2023-04-25 DIAGNOSIS — E1143 Type 2 diabetes mellitus with diabetic autonomic (poly)neuropathy: Secondary | ICD-10-CM | POA: Diagnosis present

## 2023-04-25 DIAGNOSIS — Z794 Long term (current) use of insulin: Secondary | ICD-10-CM | POA: Diagnosis not present

## 2023-04-25 DIAGNOSIS — D631 Anemia in chronic kidney disease: Secondary | ICD-10-CM | POA: Diagnosis present

## 2023-04-25 DIAGNOSIS — K3189 Other diseases of stomach and duodenum: Secondary | ICD-10-CM | POA: Diagnosis not present

## 2023-04-25 DIAGNOSIS — N2581 Secondary hyperparathyroidism of renal origin: Secondary | ICD-10-CM | POA: Diagnosis present

## 2023-04-25 DIAGNOSIS — R7989 Other specified abnormal findings of blood chemistry: Secondary | ICD-10-CM | POA: Diagnosis present

## 2023-04-25 DIAGNOSIS — I444 Left anterior fascicular block: Secondary | ICD-10-CM | POA: Diagnosis present

## 2023-04-25 DIAGNOSIS — S88111A Complete traumatic amputation at level between knee and ankle, right lower leg, initial encounter: Secondary | ICD-10-CM

## 2023-04-25 DIAGNOSIS — I5033 Acute on chronic diastolic (congestive) heart failure: Secondary | ICD-10-CM | POA: Diagnosis present

## 2023-04-25 DIAGNOSIS — R45851 Suicidal ideations: Secondary | ICD-10-CM | POA: Diagnosis present

## 2023-04-25 DIAGNOSIS — K2970 Gastritis, unspecified, without bleeding: Secondary | ICD-10-CM | POA: Diagnosis not present

## 2023-04-25 DIAGNOSIS — Z9049 Acquired absence of other specified parts of digestive tract: Secondary | ICD-10-CM

## 2023-04-25 DIAGNOSIS — T182XXA Foreign body in stomach, initial encounter: Secondary | ICD-10-CM | POA: Diagnosis not present

## 2023-04-25 DIAGNOSIS — D638 Anemia in other chronic diseases classified elsewhere: Secondary | ICD-10-CM | POA: Diagnosis present

## 2023-04-25 DIAGNOSIS — F411 Generalized anxiety disorder: Secondary | ICD-10-CM | POA: Diagnosis not present

## 2023-04-25 DIAGNOSIS — J45909 Unspecified asthma, uncomplicated: Secondary | ICD-10-CM | POA: Diagnosis present

## 2023-04-25 DIAGNOSIS — L97421 Non-pressure chronic ulcer of left heel and midfoot limited to breakdown of skin: Secondary | ICD-10-CM | POA: Diagnosis not present

## 2023-04-25 DIAGNOSIS — Z888 Allergy status to other drugs, medicaments and biological substances status: Secondary | ICD-10-CM

## 2023-04-25 DIAGNOSIS — N186 End stage renal disease: Secondary | ICD-10-CM | POA: Diagnosis present

## 2023-04-25 DIAGNOSIS — E8770 Fluid overload, unspecified: Secondary | ICD-10-CM | POA: Diagnosis not present

## 2023-04-25 DIAGNOSIS — K219 Gastro-esophageal reflux disease without esophagitis: Secondary | ICD-10-CM | POA: Diagnosis present

## 2023-04-25 DIAGNOSIS — Z8673 Personal history of transient ischemic attack (TIA), and cerebral infarction without residual deficits: Secondary | ICD-10-CM

## 2023-04-25 DIAGNOSIS — I491 Atrial premature depolarization: Secondary | ICD-10-CM | POA: Diagnosis present

## 2023-04-25 DIAGNOSIS — Z7401 Bed confinement status: Secondary | ICD-10-CM

## 2023-04-25 DIAGNOSIS — L97529 Non-pressure chronic ulcer of other part of left foot with unspecified severity: Secondary | ICD-10-CM | POA: Insufficient documentation

## 2023-04-25 DIAGNOSIS — J9811 Atelectasis: Secondary | ICD-10-CM | POA: Diagnosis present

## 2023-04-25 DIAGNOSIS — C7A010 Malignant carcinoid tumor of the duodenum: Secondary | ICD-10-CM | POA: Diagnosis not present

## 2023-04-25 DIAGNOSIS — Z8774 Personal history of (corrected) congenital malformations of heart and circulatory system: Secondary | ICD-10-CM

## 2023-04-25 DIAGNOSIS — Z885 Allergy status to narcotic agent status: Secondary | ICD-10-CM

## 2023-04-25 DIAGNOSIS — E1151 Type 2 diabetes mellitus with diabetic peripheral angiopathy without gangrene: Secondary | ICD-10-CM | POA: Diagnosis present

## 2023-04-25 DIAGNOSIS — Z833 Family history of diabetes mellitus: Secondary | ICD-10-CM

## 2023-04-25 DIAGNOSIS — F1290 Cannabis use, unspecified, uncomplicated: Secondary | ICD-10-CM | POA: Diagnosis present

## 2023-04-25 DIAGNOSIS — I132 Hypertensive heart and chronic kidney disease with heart failure and with stage 5 chronic kidney disease, or end stage renal disease: Principal | ICD-10-CM | POA: Diagnosis present

## 2023-04-25 DIAGNOSIS — F323 Major depressive disorder, single episode, severe with psychotic features: Secondary | ICD-10-CM | POA: Diagnosis present

## 2023-04-25 DIAGNOSIS — Z860100 Personal history of colon polyps, unspecified: Secondary | ICD-10-CM

## 2023-04-25 DIAGNOSIS — E876 Hypokalemia: Secondary | ICD-10-CM | POA: Diagnosis not present

## 2023-04-25 DIAGNOSIS — I5032 Chronic diastolic (congestive) heart failure: Secondary | ICD-10-CM | POA: Diagnosis not present

## 2023-04-25 DIAGNOSIS — D72829 Elevated white blood cell count, unspecified: Secondary | ICD-10-CM | POA: Diagnosis present

## 2023-04-25 DIAGNOSIS — K529 Noninfective gastroenteritis and colitis, unspecified: Secondary | ICD-10-CM | POA: Diagnosis not present

## 2023-04-25 DIAGNOSIS — E785 Hyperlipidemia, unspecified: Secondary | ICD-10-CM | POA: Diagnosis present

## 2023-04-25 DIAGNOSIS — Z8719 Personal history of other diseases of the digestive system: Secondary | ICD-10-CM

## 2023-04-25 DIAGNOSIS — Z85060 Personal history of malignant carcinoid tumor of small intestine: Secondary | ICD-10-CM

## 2023-04-25 DIAGNOSIS — S88111D Complete traumatic amputation at level between knee and ankle, right lower leg, subsequent encounter: Secondary | ICD-10-CM | POA: Diagnosis not present

## 2023-04-25 DIAGNOSIS — Z79899 Other long term (current) drug therapy: Secondary | ICD-10-CM

## 2023-04-25 DIAGNOSIS — Z8249 Family history of ischemic heart disease and other diseases of the circulatory system: Secondary | ICD-10-CM

## 2023-04-25 DIAGNOSIS — R112 Nausea with vomiting, unspecified: Secondary | ICD-10-CM | POA: Diagnosis not present

## 2023-04-25 DIAGNOSIS — R109 Unspecified abdominal pain: Secondary | ICD-10-CM | POA: Diagnosis present

## 2023-04-25 DIAGNOSIS — M797 Fibromyalgia: Secondary | ICD-10-CM | POA: Diagnosis present

## 2023-04-25 DIAGNOSIS — K7581 Nonalcoholic steatohepatitis (NASH): Secondary | ICD-10-CM | POA: Diagnosis present

## 2023-04-25 DIAGNOSIS — K2950 Unspecified chronic gastritis without bleeding: Secondary | ICD-10-CM | POA: Diagnosis not present

## 2023-04-25 DIAGNOSIS — E119 Type 2 diabetes mellitus without complications: Secondary | ICD-10-CM

## 2023-04-25 DIAGNOSIS — I2510 Atherosclerotic heart disease of native coronary artery without angina pectoris: Secondary | ICD-10-CM | POA: Diagnosis present

## 2023-04-25 DIAGNOSIS — M1A9XX0 Chronic gout, unspecified, without tophus (tophi): Secondary | ICD-10-CM | POA: Diagnosis present

## 2023-04-25 DIAGNOSIS — G4700 Insomnia, unspecified: Secondary | ICD-10-CM | POA: Diagnosis present

## 2023-04-25 DIAGNOSIS — R197 Diarrhea, unspecified: Secondary | ICD-10-CM | POA: Diagnosis not present

## 2023-04-25 DIAGNOSIS — I739 Peripheral vascular disease, unspecified: Secondary | ICD-10-CM | POA: Diagnosis not present

## 2023-04-25 DIAGNOSIS — F4310 Post-traumatic stress disorder, unspecified: Secondary | ICD-10-CM | POA: Diagnosis present

## 2023-04-25 DIAGNOSIS — F064 Anxiety disorder due to known physiological condition: Secondary | ICD-10-CM | POA: Diagnosis present

## 2023-04-25 DIAGNOSIS — R1084 Generalized abdominal pain: Secondary | ICD-10-CM | POA: Diagnosis not present

## 2023-04-25 DIAGNOSIS — Z9151 Personal history of suicidal behavior: Secondary | ICD-10-CM

## 2023-04-25 LAB — I-STAT CHEM 8, ED
BUN: 41 mg/dL — ABNORMAL HIGH (ref 6–20)
Calcium, Ion: 1.01 mmol/L — ABNORMAL LOW (ref 1.15–1.40)
Chloride: 107 mmol/L (ref 98–111)
Creatinine, Ser: 9.5 mg/dL — ABNORMAL HIGH (ref 0.44–1.00)
Glucose, Bld: 115 mg/dL — ABNORMAL HIGH (ref 70–99)
HCT: 27 % — ABNORMAL LOW (ref 36.0–46.0)
Hemoglobin: 9.2 g/dL — ABNORMAL LOW (ref 12.0–15.0)
Potassium: 2.8 mmol/L — ABNORMAL LOW (ref 3.5–5.1)
Sodium: 136 mmol/L (ref 135–145)
TCO2: 16 mmol/L — ABNORMAL LOW (ref 22–32)

## 2023-04-25 LAB — BASIC METABOLIC PANEL
Anion gap: 13 (ref 5–15)
BUN: 46 mg/dL — ABNORMAL HIGH (ref 6–20)
CO2: 17 mmol/L — ABNORMAL LOW (ref 22–32)
Calcium: 8.2 mg/dL — ABNORMAL LOW (ref 8.9–10.3)
Chloride: 105 mmol/L (ref 98–111)
Creatinine, Ser: 8.66 mg/dL — ABNORMAL HIGH (ref 0.44–1.00)
GFR, Estimated: 5 mL/min — ABNORMAL LOW (ref >60)
Glucose, Bld: 131 mg/dL — ABNORMAL HIGH (ref 70–99)
Potassium: 2.9 mmol/L — ABNORMAL LOW (ref 3.5–5.1)
Sodium: 135 mmol/L (ref 135–145)

## 2023-04-25 LAB — CBC
HCT: 27.2 % — ABNORMAL LOW (ref 36.0–46.0)
Hemoglobin: 8.2 g/dL — ABNORMAL LOW (ref 12.0–15.0)
MCH: 28.8 pg (ref 26.0–34.0)
MCHC: 30.1 g/dL (ref 30.0–36.0)
MCV: 95.4 fL (ref 80.0–100.0)
Platelets: 288 10*3/uL (ref 150–400)
RBC: 2.85 MIL/uL — ABNORMAL LOW (ref 3.87–5.11)
RDW: 15.2 % (ref 11.5–15.5)
WBC: 12.6 10*3/uL — ABNORMAL HIGH (ref 4.0–10.5)
nRBC: 0 % (ref 0.0–0.2)

## 2023-04-25 LAB — GLUCOSE, CAPILLARY: Glucose-Capillary: 90 mg/dL (ref 70–99)

## 2023-04-25 LAB — HEPATIC FUNCTION PANEL
ALT: 8 U/L (ref 0–44)
AST: 9 U/L — ABNORMAL LOW (ref 15–41)
Albumin: 2.3 g/dL — ABNORMAL LOW (ref 3.5–5.0)
Alkaline Phosphatase: 80 U/L (ref 38–126)
Bilirubin, Direct: <0.1 mg/dL (ref 0.0–0.2)
Total Bilirubin: 0.4 mg/dL (ref 0.0–1.2)
Total Protein: 6.5 g/dL (ref 6.5–8.1)

## 2023-04-25 LAB — TROPONIN I (HIGH SENSITIVITY)
Troponin I (High Sensitivity): 23 ng/L — ABNORMAL HIGH (ref <18)
Troponin I (High Sensitivity): 16 ng/L (ref <18)

## 2023-04-25 LAB — HEPATITIS B SURFACE ANTIGEN: Hepatitis B Surface Ag: NONREACTIVE

## 2023-04-25 LAB — LIPASE, BLOOD: Lipase: 24 U/L (ref 11–51)

## 2023-04-25 MED ORDER — FAMOTIDINE 20 MG PO TABS: 40.0000 mg | ORAL_TABLET | Freq: Every day | ORAL | Status: DC

## 2023-04-25 MED ORDER — ZINC OXIDE 40 % EX OINT
TOPICAL_OINTMENT | Freq: Three times a day (TID) | CUTANEOUS | Status: DC | PRN
  Filled 2023-04-25: qty 57

## 2023-04-25 MED ORDER — METOPROLOL TARTRATE 25 MG PO TABS
25.0000 mg | ORAL_TABLET | Freq: Two times a day (BID) | ORAL | Status: DC
Start: 2023-04-25 — End: 2023-05-04
  Administered 2023-04-29 – 2023-05-03 (×5): 25 mg via ORAL
  Filled 2023-04-25 (×13): qty 1

## 2023-04-25 MED ORDER — ALLOPURINOL 100 MG PO TABS: 100.0000 mg | ORAL_TABLET | Freq: Two times a day (BID) | ORAL | Status: DC

## 2023-04-25 MED ORDER — HYDROMORPHONE HCL 1 MG/ML IJ SOLN
1.0000 mg | Freq: Once | INTRAMUSCULAR | Status: AC
  Administered 2023-04-25: 1 mg via INTRAVENOUS
  Filled 2023-04-25: qty 1

## 2023-04-25 MED ORDER — POTASSIUM CHLORIDE 20 MEQ PO PACK: 40.0000 meq | PACK | Freq: Once | ORAL | Status: DC

## 2023-04-25 MED ORDER — INSULIN ASPART 100 UNIT/ML IJ SOLN: 0.0000 [IU] | Freq: Every day | INTRAMUSCULAR | Status: DC

## 2023-04-25 MED ORDER — GABAPENTIN 100 MG PO CAPS
100.0000 mg | ORAL_CAPSULE | Freq: Three times a day (TID) | ORAL | Status: DC
  Filled 2023-04-25 (×7): qty 1

## 2023-04-25 MED ORDER — ONDANSETRON HCL 4 MG/2ML IJ SOLN
4.0000 mg | Freq: Four times a day (QID) | INTRAMUSCULAR | Status: DC | PRN
  Administered 2023-04-26 – 2023-04-29 (×6): 4 mg via INTRAVENOUS
  Filled 2023-04-25 (×6): qty 2

## 2023-04-25 MED ORDER — HYDROMORPHONE HCL 1 MG/ML IJ SOLN
1.0000 mg | Freq: Four times a day (QID) | INTRAMUSCULAR | Status: DC | PRN
  Administered 2023-04-25 – 2023-04-26 (×3): 1 mg via INTRAVENOUS
  Filled 2023-04-25 (×3): qty 1

## 2023-04-25 MED ORDER — FERRIC CITRATE 1 GM 210 MG(FE) PO TABS
210.0000 mg | ORAL_TABLET | Freq: Three times a day (TID) | ORAL | Status: DC
  Administered 2023-04-29 – 2023-05-03 (×9): 210 mg via ORAL
  Filled 2023-04-25 (×27): qty 1

## 2023-04-25 MED ORDER — LEVALBUTEROL HCL 0.63 MG/3ML IN NEBU: 0.6300 mg | INHALATION_SOLUTION | Freq: Four times a day (QID) | RESPIRATORY_TRACT | Status: DC | PRN

## 2023-04-25 MED ORDER — INSULIN ASPART 100 UNIT/ML IJ SOLN: 0.0000 [IU] | Freq: Three times a day (TID) | INTRAMUSCULAR | Status: DC

## 2023-04-25 MED ORDER — SODIUM CHLORIDE 0.9% FLUSH
3.0000 mL | Freq: Two times a day (BID) | INTRAVENOUS | Status: DC
  Administered 2023-04-25 – 2023-05-03 (×11): 3 mL via INTRAVENOUS

## 2023-04-25 MED ORDER — SODIUM BICARBONATE 650 MG PO TABS
650.0000 mg | ORAL_TABLET | Freq: Two times a day (BID) | ORAL | Status: DC
  Administered 2023-04-29 – 2023-05-01 (×2): 650 mg via ORAL
  Filled 2023-04-25 (×7): qty 1

## 2023-04-25 MED ORDER — HYDROMORPHONE HCL 2 MG PO TABS
1.0000 mg | ORAL_TABLET | Freq: Four times a day (QID) | ORAL | Status: DC | PRN
  Filled 2023-04-25: qty 1

## 2023-04-25 MED ORDER — MIRTAZAPINE 15 MG PO TABS
15.0000 mg | ORAL_TABLET | Freq: Every day | ORAL | Status: DC
  Administered 2023-05-01 – 2023-05-03 (×3): 15 mg via ORAL
  Filled 2023-04-25 (×7): qty 1

## 2023-04-25 MED ORDER — SODIUM CHLORIDE 0.9 % IV SOLN: 250.0000 mL | INTRAVENOUS | Status: AC | PRN

## 2023-04-25 MED ORDER — METHOCARBAMOL 500 MG PO TABS
500.0000 mg | ORAL_TABLET | Freq: Four times a day (QID) | ORAL | Status: DC | PRN
  Administered 2023-04-25 – 2023-05-03 (×5): 500 mg via ORAL
  Filled 2023-04-25 (×11): qty 1

## 2023-04-25 MED ORDER — PANTOPRAZOLE SODIUM 40 MG PO TBEC
40.0000 mg | DELAYED_RELEASE_TABLET | Freq: Two times a day (BID) | ORAL | Status: DC
Start: 2023-04-25 — End: 2023-04-27
  Administered 2023-04-25: 40 mg via ORAL
  Filled 2023-04-25 (×2): qty 1

## 2023-04-25 MED ORDER — LOPERAMIDE HCL 2 MG PO CAPS
2.0000 mg | ORAL_CAPSULE | ORAL | Status: DC | PRN
  Administered 2023-04-28 – 2023-05-03 (×4): 2 mg via ORAL
  Filled 2023-04-25 (×4): qty 1

## 2023-04-25 MED ORDER — ONDANSETRON HCL 4 MG/2ML IJ SOLN
4.0000 mg | Freq: Once | INTRAMUSCULAR | Status: AC
  Administered 2023-04-25: 4 mg via INTRAVENOUS
  Filled 2023-04-25: qty 2

## 2023-04-25 MED ORDER — FENTANYL CITRATE PF 50 MCG/ML IJ SOSY: 50.0000 ug | PREFILLED_SYRINGE | Freq: Once | INTRAMUSCULAR | Status: DC

## 2023-04-25 MED ORDER — POTASSIUM CHLORIDE 20 MEQ PO PACK
20.0000 meq | PACK | Freq: Once | ORAL | Status: AC
  Administered 2023-04-25: 20 meq via ORAL
  Filled 2023-04-25: qty 1

## 2023-04-25 MED ORDER — ONDANSETRON HCL 4 MG PO TABS
4.0000 mg | ORAL_TABLET | Freq: Four times a day (QID) | ORAL | Status: DC | PRN
  Filled 2023-04-25: qty 1

## 2023-04-25 MED ORDER — MIDODRINE HCL 5 MG PO TABS
5.0000 mg | ORAL_TABLET | Freq: Three times a day (TID) | ORAL | Status: DC
  Administered 2023-04-26 – 2023-05-01 (×12): 5 mg via ORAL
  Filled 2023-04-25 (×13): qty 1

## 2023-04-25 MED ORDER — SODIUM CHLORIDE 0.9% FLUSH: 3.0000 mL | INTRAVENOUS | Status: DC | PRN

## 2023-04-25 MED ORDER — FUROSEMIDE 10 MG/ML IJ SOLN
80.0000 mg | INTRAMUSCULAR | Status: AC
  Administered 2023-04-25: 80 mg via INTRAVENOUS
  Filled 2023-04-25: qty 8

## 2023-04-25 MED ORDER — PROSOURCE PLUS PO LIQD
30.0000 mL | Freq: Two times a day (BID) | ORAL | Status: DC
  Filled 2023-04-25 (×8): qty 30

## 2023-04-25 MED ORDER — BUSPIRONE HCL 5 MG PO TABS
5.0000 mg | ORAL_TABLET | Freq: Three times a day (TID) | ORAL | Status: DC
  Administered 2023-04-25 – 2023-05-04 (×13): 5 mg via ORAL
  Filled 2023-04-25 (×17): qty 1

## 2023-04-25 MED ORDER — MORPHINE SULFATE (PF) 4 MG/ML IV SOLN: 4.0000 mg | Freq: Once | INTRAVENOUS | Status: DC

## 2023-04-25 MED ORDER — SODIUM CHLORIDE 0.9% FLUSH
3.0000 mL | Freq: Two times a day (BID) | INTRAVENOUS | Status: DC
  Administered 2023-04-25 – 2023-05-03 (×14): 3 mL via INTRAVENOUS

## 2023-04-25 MED ORDER — HYDROXYZINE HCL 25 MG PO TABS
25.0000 mg | ORAL_TABLET | Freq: Three times a day (TID) | ORAL | Status: DC | PRN
  Administered 2023-04-26 – 2023-05-03 (×7): 25 mg via ORAL
  Filled 2023-04-25 (×10): qty 1

## 2023-04-25 MED ORDER — ATORVASTATIN CALCIUM 10 MG PO TABS
10.0000 mg | ORAL_TABLET | Freq: Every evening | ORAL | Status: DC
  Administered 2023-05-01 – 2023-05-02 (×2): 10 mg via ORAL
  Filled 2023-04-25 (×5): qty 1

## 2023-04-25 NOTE — ED Notes (Signed)
 Patient transported to CT

## 2023-04-25 NOTE — H&P (Signed)
History and Physical    Deborah [name] BJY:[id number] DOB: 1965/07/29 DOA: 04/25/2023  PCP: Pcp, No   Patient coming from: Home   Chief Complaint:  Chief Complaint  Patient presents with   Chest Pain   ED TRIAGE note:  Pt arrives via EMS from home. Pt reports hasn't had dialysis since the 4th of this month. Pt reports abdominal pain, chest pain, and increased swelling. She is AxOx4.       HPI:  Deborah [name] is a 58 y.o. female with medical history significant of ESRD on dialysis, gastroparesis, GERD, anemia of chronic disease, paroxysmal atrial fibrillation, diastolic heart failure with preserved EF 55 to 60%, gout, duodenal carcinoid tumor, peripheral vascular disease status post right-sided BKA, history of gastric AVM, NASH, reactive dyskinesia, depression, anxiety, obesity, restrictive lung disease, spinal stenosis, chronic pain syndrome, insulin-dependent DM type II, chronic left heel diabetic ulcer, and history of chronic diarrhea in the setting of carcinoid tumor presented to emergency department complaining of shortness of breath and volume overload. Patient also complaining about nausea, vomiting, diarrhea abdominal pain for past 2 to 3 days.  Reported poor oral intake.  Patient lives home by herself and endorses she does not have anyone to help her around. Patient reporting that she has missed dialysis for 2 weeks. Patient reported that she has been volume overloaded and feels swollen to her abdomen and bilateral lower extremities. Patient denies any chest pain, palpitation, headache, fever and chill.  Patient is crying in the room and requesting for that she needs home health nurse given her present home health nurse has been quitted the job and not coming to visit her for last 2 weeks.  Patient reported that at the baseline she is bedbound and uses a [place] lift to pull herself out of to the bed and uses wheelchair at baseline.  It has been very difficult to take  care of herself at home without any help.  ED Course:  Presentation to ED patient is hemodynamically stable. B showing low potassium 2.9, low bicarb 19, sodium 135, BUN 46, creatinine 8.66 (elevated BUN and creatinine from baseline from missing dialysis, low calcium 8.2. CBC showing chronic leukocytosis elevated 0.6, stable H&H 8.2 and 27 and platelet count 288. Hepatic function panel showing low albumin 2.3. Initial troponin 23 which has been improved to 16. EKG showing sinus rhythm with premature atrial complex, left anterior fascicular block.  CT chest, abdomen and pelvis without contrast no acute chest, abdomen or pelvis abnormality.  Scattered coronary artery disease.  Scattered groundglass opacity of the lung favoring atelectasis.  Fibroid uterus.  Chest x-ray no acute cardiopulmonary disease.  ED physician Dr. Velvet Bathe has been spoken with nephrology Dr. Arlean Hopping plan for dialysis tomorrow as there is no spot for dialysis tonight.  Hospitalist has been contacted for further evaluation management of volume overload from missing dialysis.  Significant labs in the ED: Lab Orders         Basic metabolic panel         CBC         Hepatic function panel         Lipase, blood         Hepatitis B surface antigen         Hepatitis B surface antibody,quantitative         Comprehensive metabolic panel         CBC         Glucose, capillary  HIV Antibody (routine testing w rflx)         I-stat chem 8, ED (not at [facility], [facility] or [facility])       Review of Systems:  Review of Systems  Constitutional:  Negative for chills, fever, malaise/fatigue and weight loss.  Eyes:  Negative for blurred vision and double vision.  Respiratory:  Positive for shortness of breath. Negative for cough and sputum production.   Cardiovascular:  Positive for orthopnea and leg swelling. Negative for chest pain and palpitations.  Gastrointestinal:  Positive for diarrhea. Negative for abdominal pain, blood in  stool, constipation, heartburn, nausea and vomiting.  Genitourinary:  Negative for dysuria, frequency, hematuria and urgency.  Musculoskeletal:  Negative for back pain, falls, joint pain, myalgias and neck pain.  Neurological:  Negative for dizziness, tingling, tremors, loss of consciousness, weakness and headaches.  Psychiatric/Behavioral:  The patient is not nervous/anxious.     Past Medical History:  Diagnosis Date   Acute back pain with sciatica, left    Acute back pain with sciatica, right    Acute encephalopathy 05/29/2022   Acute osteomyelitis of right calcaneus (HCC) 12/21/2022   AKI (acute kidney injury) (HCC)    Anemia, unspecified    Atrial fibrillation with RVR (HCC)-resolved 09/09/2022   Atypical chest pain 09/10/2021   Cancer (HCC)    Carcinoid tumor of duodenum    Chest pain with normal coronary angiography 2019   Chronic a-fib (HCC) 09/09/2022   Chronic kidney disease, stage 3b (HCC)    Chronic pain    Chronic systolic CHF (congestive heart failure) (HCC)    Dehiscence of amputation stump of right lower extremity (HCC) 01/29/2023   Diabetes mellitus    DKA (diabetic ketoacidosis) (HCC)    Drug-seeking behavior    21 hospitalizations and 14 CT a/p in 2 years for N/V and abdominal pain, demanding only IV dilaudid   Elevated troponin    chronic   Esophageal reflux    Fibromyalgia    Gastric ulcer    Gastroparesis    Gout    HCAP (healthcare-associated pneumonia) 06/19/2022   Hyperlipidemia    Hyperosmolar hyperglycemic state (HHS) (HCC) 05/11/2022   Hyperosmolar non-ketotic state due to type 2 diabetes mellitus (HCC) 05/11/2022   Hypertension    Hypokalemia    Hypomagnesemia    Lumbosacral stenosis    LVH (left ventricular hypertrophy)    Morbid obesity (HCC)    Nausea & vomiting 09/09/2022   NICM (nonischemic cardiomyopathy) (HCC)    PAF (paroxysmal atrial fibrillation) (HCC)    Sepsis (HCC) 11/23/2022   Stroke (HCC) 02/2011   Symptomatic anemia  12/14/2022   Thrombocytosis    Vitamin B12 deficiency anemia     Past Surgical History:  Procedure Laterality Date   ABDOMINAL AORTOGRAM W/LOWER EXTREMITY N/A 11/29/2022   Procedure: ABDOMINAL AORTOGRAM W/LOWER EXTREMITY;  Surgeon: Daria Pastures, MD;  Location: [facility];  Service: Cardiovascular;  Laterality: N/A;   AMPUTATION Right 12/29/2022   Procedure: RIGHT BELOW KNEE AMPUTATION;  Surgeon: Nadara Mustard, MD;  Location: [facility] OR;  Service: Orthopedics;  Laterality: Right;   AV FISTULA PLACEMENT Left 06/30/2022   Procedure: LEFT BRACHIOCEPHALIC ARTERIOVENOUS (AV) FISTULA CREATION;  Surgeon: Cephus Shelling, MD;  Location: [facility] OR;  Service: Vascular;  Laterality: Left;   BIOPSY  07/27/2019   Procedure: BIOPSY;  Surgeon: Vida Rigger, MD;  Location: [facility]OSCOPY;  Service: Endoscopy;;   BIOPSY  07/30/2019   Procedure: BIOPSY;  Surgeon: Kathi Der, MD;  Location: [facility]OS[facility];  Service: Gastroenterology;;   CATARACT EXTRACTION  01/2014   CHOLECYSTECTOMY     COLONOSCOPY WITH PROPOFOL N/A 07/30/2019   Procedure: COLONOSCOPY WITH PROPOFOL;  Surgeon: Kathi Der, MD;  Location: [facility];  Service: Gastroenterology;  Laterality: N/A;   ESOPHAGOGASTRODUODENOSCOPY N/A 07/27/2019   Procedure: ESOPHAGOGASTRODUODENOSCOPY (EGD);  Surgeon: Vida Rigger, MD;  Location: [facility];  Service: Endoscopy;  Laterality: N/A;   ESOPHAGOGASTRODUODENOSCOPY N/A 07/26/2020   Procedure: ESOPHAGOGASTRODUODENOSCOPY (EGD);  Surgeon: Willis Modena, MD;  Location: [facility];  Service: Endoscopy;  Laterality: N/A;   ESOPHAGOGASTRODUODENOSCOPY (EGD) WITH PROPOFOL N/A 08/02/2019   Procedure: ESOPHAGOGASTRODUODENOSCOPY (EGD) WITH PROPOFOL;  Surgeon: Kathi Der, MD;  Location: [facility];  Service: Gastroenterology;  Laterality: N/A;   ESOPHAGOGASTRODUODENOSCOPY (EGD) WITH PROPOFOL N/A 12/23/2022   Procedure: ESOPHAGOGASTRODUODENOSCOPY (EGD) WITH PROPOFOL;  Surgeon: Shellia Cleverly,  DO;  Location: [facility];  Service: Gastroenterology;  Laterality: N/A;   FISTULA SUPERFICIALIZATION Left 12/31/2022   Procedure: LEFT ARM FISTULA TRANSPOSITION;  Surgeon: Maeola Harman, MD;  Location: [facility] OR;  Service: Vascular;  Laterality: Left;   GIVENS CAPSULE STUDY N/A 12/23/2022   Procedure: GIVENS CAPSULE STUDY;  Surgeon: Shellia Cleverly, DO;  Location: [facility];  Service: Gastroenterology;  Laterality: N/A;   HEMOSTASIS CLIP PLACEMENT  08/02/2019   Procedure: HEMOSTASIS CLIP PLACEMENT;  Surgeon: Kathi Der, MD;  Location: [facility];  Service: Gastroenterology;;   HOT HEMOSTASIS N/A 12/23/2022   Procedure: HOT HEMOSTASIS (ARGON PLASMA COAGULATION/BICAP);  Surgeon: Shellia Cleverly, DO;  Location: [facility] ENDOSCOPY;  Service: Gastroenterology;  Laterality: N/A;   IR FLUORO GUIDE CV LINE RIGHT  06/24/2022   IR US GUIDE VASC ACCESS RIGHT  06/24/2022   POLYPECTOMY  07/30/2019   Procedure: POLYPECTOMY;  Surgeon: Kathi Der, MD;  Location: [facility];  Service: Gastroenterology;;   POLYPECTOMY  08/02/2019   Procedure: POLYPECTOMY;  Surgeon: Kathi Der, MD;  Location: [facility];  Service: Gastroenterology;;   STUMP REVISION Right 02/02/2023   Procedure: REVISION RIGHT BELOW KNEE AMPUTATION;  Surgeon: Nadara Mustard, MD;  Location[facility] OR;  Service: Orthopedics;  Laterality: Right;     reports that she has never smoked. She has never used smokeless tobacco. She reports that she does not drink alcohol and does not use drugs.  Allergies  Allergen Reactions   Isovue [Iopamidol] Anaphylaxis, [name] and Other (See Comments)    11/28/17 Patient had seizure like activity and then 1 min code after 100 cc of isovue 300. Possible contrast allergy vs vasovagal episode  Cardiac Arrest   Nsaids Anaphylaxis and Other (See Comments)    Hx of stomach ulcers   Penicillins Itching, Palpitations and Other (See Comments)    Flushing (Red Skin) Laryngeal  Edema   Reglan [Metoclopramide] Other (See Comments)    Tardive dyskinesia    Valium [Diazepam] [name]   Zestril [Lisinopril] Anaphylaxis and Swelling    Tongue and mouth swelling Laryngeal Edema   Tolectin [Tolmetin] Nausea And Vomiting, Nausea Only and Other (See Comments)    Irritates stomach ulcer   Asa [Aspirin] Other (See Comments)    Hx of stomach ulcer   Aspartame And Phenylalanine Hives   Bentyl [Dicyclomine] Other (See Comments)    Chest pain   Hibiclens [Chlorhexidine Gluconate] Other (See Comments)    Dermatitis    Flexeril [Cyclobenzaprine] Palpitations   Oxycontin [Oxycodone] Palpitations   Rifamycins Palpitations   Tylenol [Acetaminophen] Nausea And Vomiting, Nausea Only and Other (See Comments)  Irritates stomach ulcer Abdominal pain   Ultram [Tramadol] Nausea And Vomiting and Palpitations    Family History  Problem Relation Age of Onset   Diabetes Mother    Diabetes Father    Heart disease Father    Diabetes Sister    Congestive Heart Failure Sister 65   Diabetes Brother     Prior to Admission medications   Medication Sig Start Date End Date Taking? Authorizing Provider  albuterol (PROVENTIL) (2.5 MG/3ML) 0.083% nebulizer solution Take 3 mLs (2.5 mg total) by nebulization every 6 (six) hours as needed for wheezing or shortness of breath. 04/06/19   Bing Neighbors, NP  allopurinol (ZYLOPRIM) 100 MG tablet Take 1 tablet (100 mg total) by mouth 2 (two) times daily. 11/30/22   Azucena Fallen, MD  ascorbic acid (VITAMIN C) 500 MG tablet Take 500 mg by mouth daily.    [provider]  atorvastatin (LIPITOR) 10 MG tablet Take 10 mg by mouth every evening. 12/03/21   [provider]  busPIRone (BUSPAR) 5 MG tablet Take 5 mg by mouth 3 (three) times daily.    [provider]  cetirizine (ZYRTEC) 10 MG tablet Take 10 mg by mouth daily. 08/05/22   [provider]  famotidine (PEPCID) 20 MG tablet Take 20 mg by  mouth daily before breakfast.    [provider]  ferric citrate (AURYXIA) 1 GM 210 MG(Fe) tablet Take 2 tablets (420 mg total) by mouth 3 (three) times daily with meals. Patient taking differently: Take 210 mg by mouth 3 (three) times daily with meals. 11/30/22   Azucena Fallen, MD  fluticasone [facility]) 50 MCG/ACT nasal spray Place 2 sprays into both nostrils daily as needed for allergies or rhinitis. 12/19/18   Rai, Delene Ruffini, MD  folic acid (FOLVITE) 1 MG tablet Take 1 tablet (1 mg total) by mouth daily. 01/10/23   Danford, Earl Lites, MD  gabapentin (NEURONTIN) 100 MG capsule Take 1 capsule (100 mg total) by mouth 3 (three) times daily. 11/30/22   Azucena Fallen, MD  HYDROmorphone (DILAUDID) 2 MG tablet [facility] Prescription. Dilaudid  2 mg one tablet 4 times a day as needed for pain  for 5 days. Dilaudid 2 mg one tablet 3 times a day as needed for pain for 5 days. Dilaudid 2 mg one tablet as needed for pain twice a day for 5 days. Dilaudid 2 mg one tablet daily for 5 days as needed for pain. Dilaudid 2 mg every other day for three days then discontinued. 03/29/23   Jones Bales, NP  hydrOXYzine (ATARAX) 25 MG tablet Take 1 tablet (25 mg total) by mouth 3 (three) times daily as needed for anxiety. Patient taking differently: Take 25 mg by mouth See admin instructions. Give 25 mg (1 tablet) every 6 hours as needed for anxiety, agitation for 14 days (starting 11/19), then every 8 hours as needed. 01/10/23   Danford, Earl Lites, MD  insulin lispro (HUMALOG) 100 UNIT/ML KwikPen Before each meal 3 times a day, 140-199 - 2 units, 200-250 - 6 units, 251-299 - 8 units,  300-349 - 12 units,  350 or above 14 units. Patient taking differently: Inject 0-14 Units into the skin See admin instructions. Inject 0-14 units per sliding scale before meals: < 70 notify MD 70-139 : 0 units 140-199 : 2 units 200-250 : 6 units 251-299 : 8 units 300-349 : 12 units 350-400 : 14 units > 401 notify  MD. 06/04/22   Thedore Mins,  Stanford Scotland, MD  Lactobacillus (ACIDOPHILUS) CAPS capsule Take 1 capsule by mouth 3 (three) times daily with meals. 03/08/23   Elgergawy, Leana Roe, MD  leptospermum manuka honey (MEDIHONEY) PSTE paste Apply 1 Application topically daily. 03/08/23   Elgergawy, Leana Roe, MD  loperamide (IMODIUM) 2 MG capsule Take 1 capsule (2 mg total) by mouth as needed for diarrhea or loose stools. 03/08/23   Elgergawy, Leana Roe, MD  methocarbamol (ROBAXIN) 500 MG tablet Take 1 tablet (500 mg total) by mouth every 6 (six) hours as needed for muscle spasms. 01/10/23   Danford, Earl Lites, MD  metoprolol tartrate (LOPRESSOR) 25 MG tablet Take 1 tablet (25 mg total) by mouth 2 (two) times daily. 03/08/23   Elgergawy, Leana Roe, MD  midodrine (PROAMATINE) 5 MG tablet Take 5 mg by mouth 3 (three) times daily. 02/18/23   [provider]  mirtazapine (REMERON) 15 MG tablet Take 15 mg by mouth at bedtime. 10/18/22   [provider]  Multiple Vitamins-Minerals (SENIOR MULTIVITAMIN PLUS PO) Take 1 tablet by mouth daily.    [provider]  nutrition supplement, JUVEN, (JUVEN) PACK Take 1 packet by mouth 2 (two) times daily between meals. 01/10/23   Danford, Earl Lites, MD  Nutritional Supplements (,FEEDING SUPPLEMENT, PROSOURCE PLUS) liquid Take 30 mLs by mouth 2 (two) times daily between meals. 01/10/23   Danford, Earl Lites, MD  pantoprazole (PROTONIX) 40 MG tablet Take 1 tablet (40 mg total) by mouth 2 (two) times daily. 02/18/23   Raulkar, Drema Pry, MD  torsemide (DEMADEX) 100 MG tablet Take 50 mg by mouth 2 (two) times daily. 02/18/23   [provider]     Physical Exam: Vitals:   04/25/23 1621 04/25/23 1715 04/25/23 1929 04/25/23 2027  BP: (!) 117/58 (!) 106/56 (!) 121/43 115/67  Pulse: 89 94 95 94  Resp: 18 18 17 18   Temp: (!) 97.4 F (36.3 C)  97.6 F (36.4 C)   TempSrc: Oral  Oral   SpO2: 100% 99% 100% 100%  Weight:    106.5 kg    Physical  Exam Constitutional:      Appearance: She is obese. She is ill-appearing.  Cardiovascular:     Rate and Rhythm: Normal rate. Rhythm irregular.     Heart sounds: Normal heart sounds.  Pulmonary:     Effort: Pulmonary effort is normal.     Breath sounds: Normal breath sounds. No decreased breath sounds, wheezing, rhonchi or rales.  Abdominal:     General: Bowel sounds are normal.     Palpations: Abdomen is soft. There is no fluid wave or mass.     Tenderness: There is no abdominal tenderness.  Musculoskeletal:     Cervical back: Normal range of motion and neck supple.     Left lower leg: No tenderness. Edema present.  Skin:    General: Skin is dry.     Capillary Refill: Capillary refill takes less than 2 seconds.  Neurological:     Mental Status: She is alert and oriented to person, place, and time.  Psychiatric:        Mood and Affect: Mood is anxious.      Labs on Admission: I have personally reviewed following labs and imaging studies  CBC: Recent Labs  Lab 04/25/23 1409 04/25/23 1624  WBC 12.6*  --   HGB 8.2* 9.2*  HCT 27.2* 27.0*  MCV 95.4  --   PLT 288  --    Basic Metabolic Panel: Recent Labs  Lab 04/25/23 1409 04/25/23 1624  NA 135 136  K 2.9* 2.8*  CL 105 107  CO2 17*  --   GLUCOSE 131* 115*  BUN 46* 41*  CREATININE 8.66* 9.50*  CALCIUM 8.2*  --    GFR: Estimated Creatinine Clearance: 7.9 mL/min (A) (by C-G formula based on SCr of 9.5 mg/dL (H)). Liver Function Tests: Recent Labs  Lab 04/25/23 1409  AST 9*  ALT 8  ALKPHOS 80  BILITOT 0.4  PROT 6.5  ALBUMIN 2.3*   Recent Labs  Lab 04/25/23 1409  LIPASE 24   No results for input(s): "AMMONIA" in the last 168 hours. Coagulation Profile: No results for input(s): "INR", "PROTIME" in the last 168 hours. Cardiac Enzymes: Recent Labs  Lab 04/25/23 1409 04/25/23 1527  TROPONINIHS 23* 16   BNP (last 3 results) Recent Labs    06/18/22 1817 11/23/22 0023 12/14/22 1700  BNP 267.3*  120.4* 368.9*   HbA1C: No results for input(s): "HGBA1C" in the last 72 hours. CBG: Recent Labs  Lab 04/25/23 2025  GLUCAP 90   Lipid Profile: No results for input(s): "CHOL", "HDL", "LDLCALC", "TRIG", "CHOLHDL", "LDLDIRECT" in the last 72 hours. Thyroid Function Tests: No results for input(s): "TSH", "T4TOTAL", "FREET4", "T3FREE", "THYROIDAB" in the last 72 hours. Anemia Panel: No results for input(s): "VITAMINB12", "FOLATE", "FERRITIN", "TIBC", "IRON", "RETICCTPCT" in the last 72 hours. Urine analysis:    Component Value Date/Time   COLORURINE STRAW (A) 06/19/2022 0638   APPEARANCEUR HAZY (A) 06/19/2022 0638   LABSPEC 1.010 06/19/2022 0638   PHURINE 5.0 06/19/2022 0638   GLUCOSEU NEGATIVE 06/19/2022 0638   HGBUR SMALL (A) 06/19/2022 0638   BILIRUBINUR NEGATIVE 06/19/2022 0638   KETONESUR NEGATIVE 06/19/2022 0638   PROTEINUR 30 (A) 06/19/2022 0638   UROBILINOGEN 0.2 10/02/2013 2108   NITRITE NEGATIVE 06/19/2022 0638   LEUKOCYTESUR LARGE (A) 06/19/2022 1610    Radiological Exams on Admission: I have personally reviewed images CT CHEST ABDOMEN PELVIS WO CONTRAST Result Date: 04/25/2023 CLINICAL DATA:  Sepsis, chest pain, abdominal pain EXAM: CT CHEST, ABDOMEN AND PELVIS WITHOUT CONTRAST TECHNIQUE: Multidetector CT imaging of the chest, abdomen and pelvis was performed following the standard protocol without IV contrast. RADIATION DOSE REDUCTION: This exam was performed according to the departmental dose-optimization program which includes automated exposure control, adjustment of the mA and/or kV according to patient size and/or use of iterative reconstruction technique. COMPARISON:  03/22/2023 FINDINGS: CT CHEST FINDINGS Cardiovascular: Right side dialysis catheter again noted, unchanged. Heart is normal size. Scattered coronary artery calcifications. Aorta normal caliber. Mediastinum/Nodes: No mediastinal, hilar, or axillary adenopathy. Trachea and esophagus are unremarkable.  Thyroid unremarkable. Lungs/Pleura: Scattered ground-glass opacities, likely areas of atelectasis. No confluent airspace opacities or effusions. Musculoskeletal: Chest wall soft tissues are unremarkable. No acute bony abnormality. CT ABDOMEN PELVIS FINDINGS Hepatobiliary: No focal liver abnormality is seen. Status post cholecystectomy. No biliary dilatation. Pancreas: No focal abnormality or ductal dilatation. Spleen: No focal abnormality.  Normal size. Adrenals/Urinary Tract: No adrenal abnormality. No focal renal abnormality. No stones or hydronephrosis. Urinary bladder is unremarkable. Stomach/Bowel: Stomach, large and small bowel grossly unremarkable. Vascular/Lymphatic: No evidence of aneurysm or adenopathy. Reproductive: Stable uterine fibroid.  No acute findings. Other: No free fluid or free air. Musculoskeletal: No acute bony abnormality. IMPRESSION: No acute findings in the chest, abdomen or pelvis. Scattered aortic atherosclerosis crash that scattered coronary artery disease. Scattered ground-glass opacities in the lungs, favor areas of atelectasis. Fibroid uterus. Electronically Signed   By: Charlett Nose M.D.  On: 04/25/2023 18:11   DG Chest 2 View Result Date: 04/25/2023 CLINICAL DATA:  Chest pain. EXAM: CHEST - 2 VIEW COMPARISON:  03/22/2023. FINDINGS: Bilateral lung fields are clear. No pulmonary edema. Bilateral costophrenic angles are clear. Normal cardio-mediastinal silhouette. No acute osseous abnormalities. The soft tissues are within normal limits. Right IJ hemodialysis catheter noted with its tip overlying the cavoatrial junction region, unchanged. IMPRESSION: No active cardiopulmonary disease. Electronically Signed   By: Jules Schick M.D.   On: 04/25/2023 15:57     EKG: My personal interpretation of EKG shows: Sinus rhythm with premature atrial complex heart rate 98.  Left anterior fascicular block.  There is no ST anterior abnormality.    Assessment/Plan: Principal Problem:    Volume overload Active Problems:   Chronic hypokalemia   Chronic diarrhea   ESRD on dialysis (HCC)   Anemia of chronic disease   Paroxysmal atrial fibrillation (HCC)   GERD (gastroesophageal reflux disease)   Insulin dependent type 2 diabetes mellitus (HCC)   Chronic gout   Gastroparesis   Chronic depression   NASH (nonalcoholic steatohepatitis)   Chronic pain syndrome   Hx of BKA, right (HCC)   Chronic diastolic CHF (congestive heart failure) (HCC)   Carcinoid tumor of abdomen   PVD (peripheral vascular disease) (HCC)   History of GI bleed   History of arteriovenous malformation (AVM)   Generalized anxiety disorder   History of reactive airway disease   History of spinal stenosis   Chronic diabetic ulcer of left foot determined by examination (HCC)    Assessment and Plan: Volume overload-secondary to missing dialysis ESRD on dialysis-noncompliance-secondary to no home health and  transportation issues -Patient reported that she missed dialysis for last 2 weeks.  As she does not have any home health nurse and also no transportation at all.  Requesting for help for dialysis arrangement as well as help at home for management of and home health nurse. - Presentation to ED patient is hemodynamically stable. - CBC stable H&H however evidence of chronic leukocytosis. - Troponin 23 and then trended down to 16.  EKG showed sinus rhythm with premature ventricular complex heart rate 98 without any ST-T wave abnormality.   -Patient denies any chest pressure and pain.  Denies any shortness of breath, palpitation.  Complaining about abdominal swelling and bilateral lower extremity swelling.  Patient still does make urine. -BMP showing low potassium 2.9, BUN 46, elevated creatinine 8.66.  Elevated creatinine and BUN from missing dialysis.  Patient does not have any uremic symptoms.  Low bicarb level 17 and normal anion gap 13. - Chest x-ray no active cardiopulmonary disease - Chest scattered  groundglass opacity in the lung favors atelectasis.  No evidence of pulmonary vascular congestion - Due to volume overload patient will need dialysis. - Nephrology Dr. Arlean Hopping has been consulted, due to limited dialysis support overnight plan for dialysis in the morning. - Given patient still does make urine giving Lasix 80 mg IV. - Continue cardiac monitoring.     Chronic hypokalemia -Chronic hypokalemia from chronic diarrhea.  Potassium 2.9.  Given giving patient IV Lasix 100 mg replating with oral KCl 10 mEq.  Anemia of chronic disease secondary to ESRD -Stable H&H 8.2 and 27.  Continue oral iron supplement.  Paroxysmal atrial fibrillation (HCC) -Due to history of previous recurrent GI bleed in the setting of AVM Eliquis has been discontinued in the past due to high risk of bleeding. - Continue metoprolol 25 mg twice daily.  GERD (gastroesophageal  reflux disease) -Continue Protonix   Insulin dependent type 2 diabetes mellitus (HCC) -A1c 5.7.  At home patient is on Humalog as needed with mealtime based on the blood glucose level.  Not on any long-acting insulin given history of hypoglycemia in the past. -Continue low sliding scale SSI and at bedtime insulin coverage with meals.  Chronic gout -Continue allopurinol 100 mg twice daily.   Gastroparesis -Patient is complaining about generalized abdominal pain and crying in pain.  She also has history of chronic pain syndrome and gastroparesis in the setting of DM type II Continue home Dilaudi as needed.  Generalized anxiety disorder  Chronic depression -Continue BuSpar, Atarax as needed and Remeron at bedtime,   NASH (nonalcoholic steatohepatitis)  -Continue to monitor hepatic function panel.   Chronic pain syndrome -Continue home oral Dilaudid as needed,Robaxin as needed and gabapentin   Hx of BKA, right (HCC) -Stable.  No evidence of ulcer and infection at BKA site   Chronic diastolic CHF (congestive heart failure)  (HCC) Chronic hypotension - Continue midodrine 5 mg 3 times daily.   Carcinoid tumor of abdomen Chronic diarrhea -Patient reported 2-3 episodes of bowel movement on daily basis which is her baseline. - Continue loperamide.    PVD (peripheral vascular disease) (HCC) -Continue Lipitor   History of GI bleed History of AVM -Continue Protonix and famotidine   History of reactive airway disease -Stable, continue to check pulse ox and continue Xopenex as needed   History of spinal stenosis with associated chronic pain syndrome -Continue gabapentin, Robaxin as needed   Chronic diabetic ulcer of left foot determined by examination [facility]) -Consulting inpatient wound care.  DVT prophylaxis:  SCDs.  Defer any pharmacological DVT prophylaxis as history of recurrent GI bleed from AVM Code Status:  Full Code Diet: Renal and carb modified diet Family Communication:   No one present at bedside Disposition Plan: Patient might need consecutive 2 to 3 days of dialysis due to volume overload.  Need to arrange home health nurse before DC home. Consults: Nephrology and transition care team Admission status:   Inpatient, Telemetry bed  Severity of Illness: The appropriate patient status for this patient is INPATIENT. Inpatient status is judged to be reasonable and necessary in order to provide the required intensity of service to ensure the patient's safety. The patient's presenting symptoms, physical exam findings, and initial radiographic and laboratory data in the context of their chronic comorbidities is felt to place them at high risk for further clinical deterioration. Furthermore, it is not anticipated that the patient will be medically stable for discharge from the hospital within 2 midnights of admission.   * I certify that at the point of admission it is my clinical judgment that the patient will require inpatient hospital care spanning beyond 2 midnights from the point of admission due to high  intensity of service, high risk for further deterioration and high frequency of surveillance required.[name]    Tereasa Coop, MD [facility]  How to contact the [facility] Attending or Consulting provider 7A - 7P or covering provider during after hours 7P -7A, for this patient.  Check the care team in [facility] and look for a) attending/consulting TR[facility] provider listed and b) the [facility] team listed Log into [email or web address] and use [place]'s universal password to access. If you do not have the password, please contact the hospital operator. Locate the [facility] provider you are looking for under [facility] and page to a number that you can be directly reached. If  you still have difficulty reaching the provider, please page the [facility] (Director on Call) for the Hospitalists listed on amion for assistance.  04/25/2023, 9:29 PM

## 2023-04-25 NOTE — ED Notes (Signed)
Attempted x 2 to obtain IV access for pain meds administration, unsuccessful. IV team consult requested.

## 2023-04-25 NOTE — ED Triage Notes (Signed)
Pt arrives via EMS from home. Pt reports hasn't had dialysis since the 4th of this month. Pt reports abdominal pain, chest pain, and increased swelling. She is AxOx4.

## 2023-04-25 NOTE — Consult Note (Signed)
Renal Service Consult Note [facility]  [place] 04/25/2023 [name], MD Requesting Physician: Dr. Alanda Slim  Reason for Consult: ESRD pt w/ leg swelling, CP and missed HD HPI: The patient is a 58 y.o. year-old w/ PMH as below who presented to ED c/o legs swelling, CP's and abd pains. She has missed about 2 wks of HD because her friend who was taking her to HD had to leave town. Main c/o is swelling in the legs. No SOB. No f/c/s. Some abd pain which is chronic. Pt was admitted. We are asked to see for dialysis.    Pt seen in ED room. Main c/o's are as above.    PMH H/o CVA Atrial fib HTN DM2 HL Gout  Gastroparesis Chronic systolic HF H/o duodenal carcinoid tumor   ROS - denies CP, no joint pain, no HA, no blurry vision, no rash, no diarrhea, no nausea/ vomiting, no dysuria, no difficulty voiding   Past Medical History  Past Medical History:  Diagnosis Date   Acute back pain with sciatica, left    Acute back pain with sciatica, right    Acute encephalopathy 05/29/2022   Acute osteomyelitis of right calcaneus (HCC) 12/21/2022   AKI (acute kidney injury) (HCC)    Anemia, unspecified    Atrial fibrillation with RVR (HCC)-resolved 09/09/2022   Atypical chest pain 09/10/2021   Cancer (HCC)    Carcinoid tumor of duodenum    Chest pain with normal coronary angiography 2019   Chronic a-fib (HCC) 09/09/2022   Chronic kidney disease, stage 3b (HCC)    Chronic pain    Chronic systolic CHF (congestive heart failure) (HCC)    Dehiscence of amputation stump of right lower extremity (HCC) 01/29/2023   Diabetes mellitus    DKA (diabetic ketoacidosis) (HCC)    Drug-seeking behavior    21 hospitalizations and 14 CT a/p in 2 years for N/V and abdominal pain, demanding only IV dilaudid   Elevated troponin    chronic   Esophageal reflux    Fibromyalgia    Gastric ulcer    Gastroparesis    Gout    HCAP (healthcare-associated pneumonia) 06/19/2022    Hyperlipidemia    Hyperosmolar hyperglycemic state (HHS) (HCC) 05/11/2022   Hyperosmolar non-ketotic state due to type 2 diabetes mellitus (HCC) 05/11/2022   Hypertension    Hypokalemia    Hypomagnesemia    Lumbosacral stenosis    LVH (left ventricular hypertrophy)    Morbid obesity (HCC)    Nausea & vomiting 09/09/2022   NICM (nonischemic cardiomyopathy) (HCC)    PAF (paroxysmal atrial fibrillation) (HCC)    Sepsis (HCC) 11/23/2022   Stroke (HCC) 02/2011   Symptomatic anemia 12/14/2022   Thrombocytosis    Vitamin B12 deficiency anemia    Past Surgical History  Past Surgical History:  Procedure Laterality Date   ABDOMINAL AORTOGRAM W/LOWER EXTREMITY N/A 11/29/2022   Procedure: ABDOMINAL AORTOGRAM W/LOWER EXTREMITY;  Surgeon: Daria Pastures, MD;  Location: [facility];  Service: Cardiovascular;  Laterality: N/A;   AMPUTATION Right 12/29/2022   Procedure: RIGHT BELOW KNEE AMPUTATION;  Surgeon: Nadara Mustard, MD;  Location: [facility] OR;  Service: Orthopedics;  Laterality: Right;   AV FISTULA PLACEMENT Left 06/30/2022   Procedure: LEFT BRACHIOCEPHALIC ARTERIOVENOUS (AV) FISTULA CREATION;  Surgeon: Cephus Shelling, MD;  Location: [facility] OR;  Service: Vascular;  Laterality: Left;   BIOPSY  07/27/2019   Procedure: BIOPSY;  Surgeon: Vida Rigger, MD;  Location: [facility] ENDOSCOPY;  Service:  Endoscopy;;   BIOPSY  07/30/2019   Procedure: BIOPSY;  Surgeon: Kathi Der, MD;  Location: [facility] ENDOSCOPY;  Service: Gastroenterology;;   CATARACT EXTRACTION  01/2014   CHOLECYSTECTOMY     COLONOSCOPY WITH PROPOFOL N/A 07/30/2019   Procedure: COLONOSCOPY WITH PROPOFOL;  Surgeon: Kathi Der, MD;  Location: [facility] ENDOSCOPY;  Service: Gastroenterology;  Laterality: N/A;   ESOPHAGOGASTRODUODENOSCOPY N/A 07/27/2019   Procedure: ESOPHAGOGASTRODUODENOSCOPY (EGD);  Surgeon: Vida Rigger, MD;  Location: [facility];  Service: Endoscopy;  Laterality: N/A;   ESOPHAGOGASTRODUODENOSCOPY N/A 07/26/2020    Procedure: ESOPHAGOGASTRODUODENOSCOPY (EGD);  Surgeon: Willis Modena, MD;  Location: [facility];  Service: Endoscopy;  Laterality: N/A;   ESOPHAGOGASTRODUODENOSCOPY (EGD) WITH PROPOFOL N/A 08/02/2019   Procedure: ESOPHAGOGASTRODUODENOSCOPY (EGD) WITH PROPOFOL;  Surgeon: Kathi Der, MD;  Location: [facility];  Service: Gastroenterology;  Laterality: N/A;   ESOPHAGOGASTRODUODENOSCOPY (EGD) WITH PROPOFOL N/A 12/23/2022   Procedure: ESOPHAGOGASTRODUODENOSCOPY (EGD) WITH PROPOFOL;  Surgeon: Shellia Cleverly, DO;  Location: [facility] ENDOSCOPY;  Service: Gastroenterology;  Laterality: N/A;   FISTULA SUPERFICIALIZATION Left 12/31/2022   Procedure: LEFT ARM FISTULA TRANSPOSITION;  Surgeon: Maeola Harman, MD;  [facility];  Service: Vascular;  Laterality: Left;   GIVENS CAPSULE STUDY N/A 12/23/2022   Procedure: GIVENS CAPSULE STUDY;  Surgeon: Shellia Cleverly, DO;  Location: [facility] ENDOSCOPY;  Service: Gastroenterology;  Laterality: N/A;   HEMOSTASIS CLIP PLACEMENT  08/02/2019   Procedure: HEMOSTASIS CLIP PLACEMENT;  Surgeon: Kathi Der, MD;  Location: [facility];  Service: Gastroenterology;;   HOT HEMOSTASIS N/A 12/23/2022   Procedure: HOT HEMOSTASIS (ARGON PLASMA COAGULATION/BICAP);  Surgeon: Shellia Cleverly, DO;  Location: [facility] ENDOSCOPY;  Service: Gastroenterology;  Laterality: N/A;   IR FLUORO GUIDE CV LINE RIGHT  06/24/2022   IR US GUIDE VASC ACCESS RIGHT  06/24/2022   POLYPECTOMY  07/30/2019   Procedure: POLYPECTOMY;  Surgeon: Kathi Der, MD;  Location: [facility];  Service: Gastroenterology;;   POLYPECTOMY  08/02/2019   Procedure: POLYPECTOMY;  Surgeon: Kathi Der, MD;  Location: [facility]OSCOPY;  Service: Gastroenterology;;   STUMP REVISION Right 02/02/2023   Procedure: REVISION RIGHT BELOW KNEE AMPUTATION;  Surgeon: Nadara Mustard, MD;  Location: [facility] OR;  Service: Orthopedics;  Laterality: Right;   Family History  Family History  Problem Relation Age of  Onset   Diabetes Mother    Diabetes Father    Heart disease Father    Diabetes Sister    Congestive Heart Failure Sister 27   Diabetes Brother    Social History  reports that she has never smoked. She has never used smokeless tobacco. She reports that she does not drink alcohol and does not use drugs. Allergies  Allergies  Allergen Reactions   Isovue [Iopamidol] Anaphylaxis, [name] and Other (See Comments)    11/28/17 Patient had seizure like activity and then 1 min code after 100 cc of isovue 300. Possible contrast allergy vs vasovagal episode  Cardiac Arrest   Nsaids Anaphylaxis and Other (See Comments)    Hx of stomach ulcers   Penicillins Itching, Palpitations and Other (See Comments)    Flushing (Red Skin) Laryngeal Edema   Reglan [Metoclopramide] Other (See Comments)    Tardive dyskinesia    Valium [Diazepam] [name]   Zestril [Lisinopril] Anaphylaxis and Swelling    Tongue and mouth swelling Laryngeal Edema   Tolectin [Tolmetin] Nausea And Vomiting, Nausea Only and Other (See Comments)    Irritates stomach ulcer   Asa [Aspirin] Other (See Comments)    Hx  of stomach ulcer   Aspartame And Phenylalanine Hives   Bentyl [Dicyclomine] Other (See Comments)    Chest pain   Hibiclens [Chlorhexidine Gluconate] Other (See Comments)    Dermatitis    Flexeril [Cyclobenzaprine] Palpitations   Oxycontin [Oxycodone] Palpitations   Rifamycins Palpitations   Tylenol [Acetaminophen] Nausea And Vomiting, Nausea Only and Other (See Comments)    Irritates stomach ulcer Abdominal pain   Ultram [Tramadol] Nausea And Vomiting and Palpitations   Home medications Prior to Admission medications   Medication Sig Start Date End Date Taking? Authorizing Provider  albuterol (PROVENTIL) (2.5 MG/3ML) 0.083% nebulizer solution Take 3 mLs (2.5 mg total) by nebulization every 6 (six) hours as needed for wheezing or shortness of breath. 04/06/19   Bing Neighbors, NP   allopurinol (ZYLOPRIM) 100 MG tablet Take 1 tablet (100 mg total) by mouth 2 (two) times daily. 11/30/22   Azucena Fallen, MD  ascorbic acid (VITAMIN C) 500 MG tablet Take 500 mg by mouth daily.    [provider]  atorvastatin (LIPITOR) 10 MG tablet Take 10 mg by mouth every evening. 12/03/21   [provider]  busPIRone (BUSPAR) 5 MG tablet Take 5 mg by mouth 3 (three) times daily.    [provider]  cetirizine (ZYRTEC) 10 MG tablet Take 10 mg by mouth daily. 08/05/22   [provider]  famotidine (PEPCID) 20 MG tablet Take 20 mg by mouth daily before breakfast.    [provider]  ferric citrate (AURYXIA) 1 GM 210 MG(Fe) tablet Take 2 tablets (420 mg total) by mouth 3 (three) times daily with meals. Patient taking differently: Take 210 mg by mouth 3 (three) times daily with meals. 11/30/22   Azucena Fallen, MD  fluticasone [facility]) 50 MCG/ACT nasal spray Place 2 sprays into both nostrils daily as needed for allergies or rhinitis. 12/19/18   Rai, Delene Ruffini, MD  folic acid (FOLVITE) 1 MG tablet Take 1 tablet (1 mg total) by mouth daily. 01/10/23   Danford, Earl Lites, MD  gabapentin (NEURONTIN) 100 MG capsule Take 1 capsule (100 mg total) by mouth 3 (three) times daily. 11/30/22   Azucena Fallen, MD  HYDROmorphone (DILAUDID) 2 MG tablet Wean Prescription. Dilaudid  2 mg one tablet 4 times a day as needed for pain  for 5 days. Dilaudid 2 mg one tablet 3 times a day as needed for pain for 5 days. Dilaudid 2 mg one tablet as needed for pain twice a day for 5 days. Dilaudid 2 mg one tablet daily for 5 days as needed for pain. Dilaudid 2 mg every other day for three days then discontinued. 03/29/23   Jones Bales, NP  hydrOXYzine (ATARAX) 25 MG tablet Take 1 tablet (25 mg total) by mouth 3 (three) times daily as needed for anxiety. Patient taking differently: Take 25 mg by mouth See admin instructions. Give 25 mg (1 tablet) every 6 hours as  needed for anxiety, agitation for 14 days (starting 11/19), then every 8 hours as needed. 01/10/23   Danford, Earl Lites, MD  insulin lispro (HUMALOG) 100 UNIT/ML KwikPen Before each meal 3 times a day, 140-199 - 2 units, 200-250 - 6 units, 251-299 - 8 units,  300-349 - 12 units,  350 or above 14 units. Patient taking differently: Inject 0-14 Units into the skin See admin instructions. Inject 0-14 units per sliding scale before meals: < 70 notify MD 70-139 : 0 units 140-199 : 2 units 200-250 : 6  units 251-299 : 8 units 300-349 : 12 units 350-400 : 14 units > 401 notify MD. 06/04/22   Leroy Sea, MD  Lactobacillus (ACIDOPHILUS) CAPS capsule Take 1 capsule by mouth 3 (three) times daily with meals. 03/08/23   Elgergawy, Leana Roe, MD  leptospermum manuka honey (MEDIHONEY) PSTE paste Apply 1 Application topically daily. 03/08/23   Elgergawy, Leana Roe, MD  loperamide (IMODIUM) 2 MG capsule Take 1 capsule (2 mg total) by mouth as needed for diarrhea or loose stools. 03/08/23   Elgergawy, Leana Roe, MD  methocarbamol (ROBAXIN) 500 MG tablet Take 1 tablet (500 mg total) by mouth every 6 (six) hours as needed for muscle spasms. 01/10/23   Danford, Earl Lites, MD  metoprolol tartrate (LOPRESSOR) 25 MG tablet Take 1 tablet (25 mg total) by mouth 2 (two) times daily. 03/08/23   Elgergawy, Leana Roe, MD  midodrine (PROAMATINE) 5 MG tablet Take 5 mg by mouth 3 (three) times daily. 02/18/23   [provider]  mirtazapine (REMERON) 15 MG tablet Take 15 mg by mouth at bedtime. 10/18/22   [provider]  Multiple Vitamins-Minerals (SENIOR MULTIVITAMIN PLUS PO) Take 1 tablet by mouth daily.    [provider]  nutrition supplement, JUVEN, (JUVEN) PACK Take 1 packet by mouth 2 (two) times daily between meals. 01/10/23   Danford, Earl Lites, MD  Nutritional Supplements (,FEEDING SUPPLEMENT, PROSOURCE PLUS) liquid Take 30 mLs by mouth 2 (two) times daily between meals. 01/10/23    Danford, Earl Lites, MD  pantoprazole (PROTONIX) 40 MG tablet Take 1 tablet (40 mg total) by mouth 2 (two) times daily. 02/18/23   Raulkar, Drema Pry, MD  torsemide (DEMADEX) 100 MG tablet Take 50 mg by mouth 2 (two) times daily. 02/18/23   [provider]     Vitals:   04/25/23 1259 04/25/23 1305  BP:  107/64  Pulse:  99  Resp:  18  Temp:  97.7 F (36.5 C)  TempSrc:  Oral  SpO2: 99% 100%   Exam Gen alert, no distress, on RA No rash, cyanosis or gangrene Sclera anicteric, throat clear  No jvd or bruits Chest clear bilat to bases, no rales/ wheezing RRR no MRG Abd soft ntnd no mass or ascites +bs GU defer MS R BKA Ext 1-2+ diffuse R and L LE edema Neuro is alert, Ox 3 , nf    TDC in chest intact      Renal-related home meds: - auryxia 2 ac tid - torsemide 50 bid - midodrine 5 tid - metoprolol 25 bid - others: insulins, PPI, remeron, dilaudid, neurontin, pepcid, buspar, statin, zyloprim, albuterol prn    OP HD: TTS [facility]  From dec 2024 --> 4h  400/800  112kg   3K/2.5Ca bath   TDC   Heparin none    Assessment/ Plan: Volume overload - d/t missing HD which were transportation issues per the pt. Mostly LE edema. Max UF w/ HD.  ESRD - on HD TTS. Will plan for HD tomorrow am.  BP - chronic hypotension it appears. BP here is soft to low-normal. On midodrine 5 tid, takes BB for afib most likely.  Volume - 2-3+ LE edema, max UF w/ HD.  Anemia of esrd - Hb 8- 9 here, get records. Transfuse prn.  Secondary hyperparathyroidism - CCa in range, add on phos. Resume binders w/ meals.  Atrial fib - chronic       Vinson Moselle  MD [facility] 04/25/2023, 4:13 PM  Recent Labs  Lab  04/25/23 1409  HGB 8.2*  ALBUMIN 2.3*  CALCIUM 8.2*  CREATININE 8.66*  K 2.9*   Inpatient medications:

## 2023-04-25 NOTE — ED Provider Triage Note (Signed)
Emergency Medicine Provider Triage Evaluation Note  Deborah [name] , a 58 y.o. female  was evaluated in triage.  Pt complains of chest pain and abdominal pain and swelling.  Says that there is an issue getting to dialysis and she has not been to dialysis in 2 weeks.  Says that more recently she started developing chest pain as well as abdominal pain.  Says her leg swelling as well.  Review of Systems  Positive: See above Negative: See above  Physical Exam  BP 107/64 (BP Location: Right Arm)   Pulse 99   Temp 97.7 F (36.5 C) (Oral)   Resp 18   LMP 10/10/2012   SpO2 100%  Gen:   Awake, no distress   Resp:  Normal effort  MSK:   Moves extremities without difficulty  Other:  Lower extremity edema in her left lower extremity.  Amputation of right lower extremity.  No rebound or guarding on abdominal exam.  Medical Decision Making  Medically screening exam initiated at 1:25 PM.  Appropriate orders placed.  Deborah [name] was informed that the remainder of the evaluation will be completed by another provider, this initial triage assessment does not replace that evaluation, and the importance of remaining in the ED until their evaluation is complete.  Asked to be brought back to a room instead of lobby   Deborah Baton, MD 04/25/23 1326

## 2023-04-25 NOTE — ED Notes (Signed)
Pt's 1st, 2nd and 3rd finger on the left hand pinched between the wall and bed when NT was lowering bed. RN notified and pt provided with an ice pack

## 2023-04-25 NOTE — ED Notes (Signed)
Patient reports her pain is increasing again, patient also reports she usually takes midodrine and hasn't taken it today because she is vomiting everything up.

## 2023-04-25 NOTE — ED Provider Notes (Signed)
[place] EMERGENCY DEPARTMENT AT [facility] Provider Note  Arrival date/time:04/25/2023 7:26 PM  HPI/ROS   Deborah [name] is a 58 y.o. female with PMH significant for ESRD, T2DM, HTN, GERD, NASH, carcinoid tumor of the duodenum, A-fib, HFpEF, who presents for Sob and volume overload.  History is provided by patient.  Patient is coming from home for nausea, vomiting, diarrhea and abdominal pain for the past 2 to 3 days.  She feels that she is not been able to keep anything down.  She lives at home by herself and endorse that she has no one to help her get around.  Also endorsing diffuse pain that she is unable to get under control.  She is also not been to dialysis for 2 weeks.  Typically she dialysis on Tuesday Thursday and Saturday.  However the person who typically helps her get to dialysis quit 2 weeks ago and so she has been a unable to go. She feels that she is fluid overloaded and feels swollen all over her whole body.  A complete ROS was performed with pertinent positives/negatives noted above.   ED Course and Medical Decision Making   I personally reviewed the patient's vitals.  Assessment/Plan: This is a 58 year old patient with history of ESRD on dialysis Tuesday Thursday Saturday who does not receive dialysis in 2 weeks presenting with volume overload, chest pain and shortness of breath.  Requiring BMP shows hypokalemia at 2.9, which is chronic for patient, A creatinine of 8.66 in the setting of ESRD. White blood count is mildly elevated at 12.6, hemoglobin is 8.2 which is baseline. Troponin is 23, likely due to demand ischemia in the setting of volume overload.  CT chest abdomen pelvis shows no acute pathology.  Nephrology consulted for dialysis in the setting of missed dialysis x2 weeks.  Will plan to dialyze inpatient. No need for emergent dialysis at this time given she is satting well on room air and her electrolytes are reassuring  Patient has been  given multiple doses of Dilaudid without adequate control of her chronic pain/spinal stenosis. She takes hydromorphine at home and has history of difficult to control pain.  Plan admit patient for pain control and need for dialysis.  Disposition:  I discussed the case with hospitalist team who graciously agreed to admit the patient to their service for continued care.   Clinical Impression:  1. Pain     Rx / DC Orders ED Discharge Orders     None       The plan for this patient was discussed with Dr. Silverio Lay, who voiced agreement and who oversaw evaluation and treatment of this patient.   Clinical Complexity A medically appropriate history, review of systems, and physical exam was performed.  Patient's presentation is most consistent with acute presentation with potential threat to life or bodily function.  Medical Decision Making Amount and/or Complexity of Data Reviewed Labs: ordered. Radiology: ordered.  Risk Prescription drug management. Decision regarding hospitalization.    Physical Exam and Medical History   Vitals:   04/25/23 1259 04/25/23 1305 04/25/23 1621 04/25/23 1715  BP:  107/64 (!) 117/58 (!) 106/56  Pulse:  99 89 94  Resp:  18 18 18   Temp:  97.7 F (36.5 C) (!) 97.4 F (36.3 C)   TempSrc:  Oral Oral   SpO2: 99% 100% 100% 99%    Physical Exam Vitals and nursing note reviewed.  Constitutional:      General: She is in acute distress.  Comments: tearful  HENT:     Head: Normocephalic and atraumatic.  Eyes:     Conjunctiva/sclera: Conjunctivae normal.  Cardiovascular:     Rate and Rhythm: Normal rate and regular rhythm.     Heart sounds: No murmur heard. Pulmonary:     Effort: Tachypnea present.     Breath sounds: Rhonchi present.  Abdominal:     Palpations: Abdomen is soft.     Tenderness: There is no abdominal tenderness.  Musculoskeletal:        General: No swelling.     Cervical back: Neck supple.     Right lower leg: Edema  present.     Left lower leg: Edema present.  Skin:    General: Skin is warm and dry.     Capillary Refill: Capillary refill takes less than 2 seconds.  Neurological:     Mental Status: She is alert.     Medical History: Allergies  Allergen Reactions   Isovue [Iopamidol] Anaphylaxis, [name] and Other (See Comments)    11/28/17 Patient had seizure like activity and then 1 min code after 100 cc of isovue 300. Possible contrast allergy vs vasovagal episode  Cardiac Arrest   Nsaids Anaphylaxis and Other (See Comments)    Hx of stomach ulcers   Penicillins Itching, Palpitations and Other (See Comments)    Flushing (Red Skin) Laryngeal Edema   Reglan [Metoclopramide] Other (See Comments)    Tardive dyskinesia    Valium [Diazepam] [name]   Zestril [Lisinopril] Anaphylaxis and Swelling    Tongue and mouth swelling Laryngeal Edema   Tolectin [Tolmetin] Nausea And Vomiting, Nausea Only and Other (See Comments)    Irritates stomach ulcer   Asa [Aspirin] Other (See Comments)    Hx of stomach ulcer   Aspartame And Phenylalanine Hives   Bentyl [Dicyclomine] Other (See Comments)    Chest pain   Hibiclens [Chlorhexidine Gluconate] Other (See Comments)    Dermatitis    Flexeril [Cyclobenzaprine] Palpitations   Oxycontin [Oxycodone] Palpitations   Rifamycins Palpitations   Tylenol [Acetaminophen] Nausea And Vomiting, Nausea Only and Other (See Comments)    Irritates stomach ulcer Abdominal pain   Ultram [Tramadol] Nausea And Vomiting and Palpitations   Past Medical History:  Diagnosis Date   Acute back pain with sciatica, left    Acute back pain with sciatica, right    Acute encephalopathy 05/29/2022   Acute osteomyelitis of right calcaneus (HCC) 12/21/2022   AKI (acute kidney injury) (HCC)    Anemia, unspecified    Atrial fibrillation with RVR (HCC)-resolved 09/09/2022   Atypical chest pain 09/10/2021   Cancer (HCC)    Carcinoid tumor of duodenum     Chest pain with normal coronary angiography 2019   Chronic a-fib (HCC) 09/09/2022   Chronic kidney disease, stage 3b (HCC)    Chronic pain    Chronic systolic CHF (congestive heart failure) (HCC)    Dehiscence of amputation stump of right lower extremity (HCC) 01/29/2023   Diabetes mellitus    DKA (diabetic ketoacidosis) (HCC)    Drug-seeking behavior    21 hospitalizations and 14 CT a/p in 2 years for N/V and abdominal pain, demanding only IV dilaudid   Elevated troponin    chronic   Esophageal reflux    Fibromyalgia    Gastric ulcer    Gastroparesis    Gout    HCAP (healthcare-associated pneumonia) 06/19/2022   Hyperlipidemia    Hyperosmolar hyperglycemic state (HHS) (HCC) 05/11/2022   Hyperosmolar  non-ketotic state due to type 2 diabetes mellitus (HCC) 05/11/2022   Hypertension    Hypokalemia    Hypomagnesemia    Lumbosacral stenosis    LVH (left ventricular hypertrophy)    Morbid obesity (HCC)    Nausea & vomiting 09/09/2022   NICM (nonischemic cardiomyopathy) (HCC)    PAF (paroxysmal atrial fibrillation) (HCC)    Sepsis (HCC) 11/23/2022   Stroke (HCC) 02/2011   Symptomatic anemia 12/14/2022   Thrombocytosis    Vitamin B12 deficiency anemia     Past Surgical History:  Procedure Laterality Date   ABDOMINAL AORTOGRAM W/LOWER EXTREMITY N/A 11/29/2022   Procedure: ABDOMINAL AORTOGRAM W/LOWER EXTREMITY;  Surgeon: Daria Pastures, MD;  Location: [facility];  Service: Cardiovascular;  Laterality: N/A;   AMPUTATION Right 12/29/2022   Procedure: RIGHT BELOW KNEE AMPUTATION;  Surgeon: Nadara Mustard, MD;  Location: [facility] OR;  Service: Orthopedics;  Laterality: Right;   AV FISTULA PLACEMENT Left 06/30/2022   Procedure: LEFT BRACHIOCEPHALIC ARTERIOVENOUS (AV) FISTULA CREATION;  Surgeon: Cephus Shelling, MD;  Location: [facility] OR;  Service: Vascular;  Laterality: Left;   BIOPSY  07/27/2019   Procedure: BIOPSY;  Surgeon: Vida Rigger, MD;  Location: [facility] ENDOSCOPY;  Service:  Endoscopy;;   BIOPSY  07/30/2019   Procedure: BIOPSY;  Surgeon: Kathi Der, MD;  Location: [facility] ENDOSCOPY;  Service: Gastroenterology;;   CATARACT EXTRACTION  01/2014   CHOLECYSTECTOMY     COLONOSCOPY WITH PROPOFOL N/A 07/30/2019   Procedure: COLONOSCOPY WITH PROPOFOL;  Surgeon: Kathi Der, MD;  Location: [facility] ENDOS[facility];  Service: Gastroenterology;  Laterality: N/A;   ESOPHAGOGASTRODUODENOSCOPY N/A 07/27/2019   Procedure: ESOPHAGOGASTRODUODENOSCOPY (EGD);  Surgeon: Vida Rigger, MD;  Location: [facility];  Service: Endoscopy;  Laterality: N/A;   ESOPHAGOGASTRODUODENOSCOPY N/A 07/26/2020   Procedure: ESOPHAGOGASTRODUODENOSCOPY (EGD);  Surgeon: Willis Modena, MD;  Location: [facility];  Service: Endoscopy;  Laterality: N/A;   ESOPHAGOGASTRODUODENOSCOPY (EGD) WITH PROPOFOL N/A 08/02/2019   Procedure: ESOPHAGOGASTRODUODENOSCOPY (EGD) WITH PROPOFOL;  Surgeon: Kathi Der, MD;  Location: [facility];  Service: Gastroenterology;  Laterality: N/A;   ESOPHAGOGASTRODUODENOSCOPY (EGD) WITH PROPOFOL N/A 12/23/2022   Procedure: ESOPHAGOGASTRODUODENOSCOPY (EGD) WITH PROPOFOL;  Surgeon: Shellia Cleverly, DO;  Location: [facility];  Service: Gastroenterology;  Laterality: N/A;   FISTULA SUPERFICIALIZATION Left 12/31/2022   Procedure: LEFT ARM FISTULA TRANSPOSITION;  Surgeon: Maeola Harman, MD;  Location: [facility] OR;  Service: Vascular;  Laterality: Left;   GIVENS CAPSULE STUDY N/A 12/23/2022   Procedure: GIVENS CAPSULE STUDY;  Surgeon: Shellia Cleverly, DO;  Location: [facility]OSCOPY;  Service: Gastroenterology;  Laterality: N/A;   HEMOSTASIS CLIP PLACEMENT  08/02/2019   Procedure: HEMOSTASIS CLIP PLACEMENT;  Surgeon: Kathi Der, MD;  Location: [facility];  Service: Gastroenterology;;   HOT HEMOSTASIS N/A 12/23/2022   Procedure: HOT HEMOSTASIS (ARGON PLASMA COAGULATION/BICAP);  Surgeon: Shellia Cleverly, DO;  Location: [facility] ENDOSCOPY;  Service: Gastroenterology;  Laterality:  N/A;   IR FLUORO GUIDE CV LINE RIGHT  06/24/2022   IR US GUIDE VASC ACCESS RIGHT  06/24/2022   POLYPECTOMY  07/30/2019   Procedure: POLYPECTOMY;  Surgeon: Kathi Der, MD;  Location: [facility];  Service: Gastroenterology;;   POLYPECTOMY  08/02/2019   Procedure: POLYPECTOMY;  Surgeon: Kathi Der, MD;  Location: [facility];  Service: Gastroenterology;;   STUMP REVISION Right 02/02/2023   Procedure: REVISION RIGHT BELOW KNEE AMPUTATION;  Surgeon: Nadara Mustard, MD;  Location: [facility] OR;  Service: Orthopedics;  Laterality: Right;   Family History  Problem Relation Age of Onset  Diabetes Mother    Diabetes Father    Heart disease Father    Diabetes Sister    Congestive Heart Failure Sister 35   Diabetes Brother     Social History   Tobacco Use   Smoking status: Never   Smokeless tobacco: Never  Vaping Use   Vaping status: Never Used  Substance Use Topics   Alcohol use: No   Drug use: No    Procedures   If procedures were preformed on this patient, they are listed below:  Procedures   -------- HPI and MDM generated using voice dictation software and may contain dictation errors. Please contact me for any clarification or with any questions.   Cephus Slater, MD Emergency Medicine PGY-2    Caron Presume, MD 04/25/23 Barnie Mort    Charlynne Pander, MD 04/26/23 [phone]

## 2023-04-26 ENCOUNTER — Encounter (HOSPITAL_COMMUNITY): Payer: Self-pay | Admitting: Internal Medicine

## 2023-04-26 DIAGNOSIS — E8770 Fluid overload, unspecified: Secondary | ICD-10-CM

## 2023-04-26 DIAGNOSIS — I480 Paroxysmal atrial fibrillation: Secondary | ICD-10-CM | POA: Diagnosis not present

## 2023-04-26 LAB — CBC
HCT: 25.9 % — ABNORMAL LOW (ref 36.0–46.0)
Hemoglobin: 8.2 g/dL — ABNORMAL LOW (ref 12.0–15.0)
MCH: 29.9 pg (ref 26.0–34.0)
MCHC: 31.7 g/dL (ref 30.0–36.0)
MCV: 94.5 fL (ref 80.0–100.0)
Platelets: 273 10*3/uL (ref 150–400)
RBC: 2.74 MIL/uL — ABNORMAL LOW (ref 3.87–5.11)
RDW: 15.2 % (ref 11.5–15.5)
WBC: 12.9 10*3/uL — ABNORMAL HIGH (ref 4.0–10.5)
nRBC: 0 % (ref 0.0–0.2)

## 2023-04-26 LAB — RENAL FUNCTION PANEL
Albumin: 2.2 g/dL — ABNORMAL LOW (ref 3.5–5.0)
Anion gap: 15 (ref 5–15)
BUN: 50 mg/dL — ABNORMAL HIGH (ref 6–20)
CO2: 17 mmol/L — ABNORMAL LOW (ref 22–32)
Calcium: 8.4 mg/dL — ABNORMAL LOW (ref 8.9–10.3)
Chloride: 104 mmol/L (ref 98–111)
Creatinine, Ser: 8.66 mg/dL — ABNORMAL HIGH (ref 0.44–1.00)
GFR, Estimated: 5 mL/min — ABNORMAL LOW (ref >60)
Glucose, Bld: 81 mg/dL (ref 70–99)
Phosphorus: 10.7 mg/dL — ABNORMAL HIGH (ref 2.5–4.6)
Potassium: 3.1 mmol/L — ABNORMAL LOW (ref 3.5–5.1)
Sodium: 136 mmol/L (ref 135–145)

## 2023-04-26 LAB — COMPREHENSIVE METABOLIC PANEL
ALT: 7 U/L (ref 0–44)
AST: 9 U/L — ABNORMAL LOW (ref 15–41)
Albumin: 2.1 g/dL — ABNORMAL LOW (ref 3.5–5.0)
Alkaline Phosphatase: 78 U/L (ref 38–126)
Anion gap: 13 (ref 5–15)
BUN: 48 mg/dL — ABNORMAL HIGH (ref 6–20)
CO2: 17 mmol/L — ABNORMAL LOW (ref 22–32)
Calcium: 8.5 mg/dL — ABNORMAL LOW (ref 8.9–10.3)
Chloride: 105 mmol/L (ref 98–111)
Creatinine, Ser: 8.88 mg/dL — ABNORMAL HIGH (ref 0.44–1.00)
GFR, Estimated: 5 mL/min — ABNORMAL LOW (ref >60)
Glucose, Bld: 90 mg/dL (ref 70–99)
Potassium: 3 mmol/L — ABNORMAL LOW (ref 3.5–5.1)
Sodium: 135 mmol/L (ref 135–145)
Total Bilirubin: 0.5 mg/dL (ref 0.0–1.2)
Total Protein: 6.1 g/dL — ABNORMAL LOW (ref 6.5–8.1)

## 2023-04-26 LAB — GLUCOSE, CAPILLARY: Glucose-Capillary: 89 mg/dL (ref 70–99)

## 2023-04-26 LAB — HIV ANTIBODY (ROUTINE TESTING W REFLEX): HIV Screen 4th Generation wRfx: NONREACTIVE

## 2023-04-26 MED ORDER — PENTAFLUOROPROP-TETRAFLUOROETH EX AERO: 1.0000 | INHALATION_SPRAY | CUTANEOUS | Status: DC | PRN

## 2023-04-26 MED ORDER — DARBEPOETIN ALFA 200 MCG/0.4ML IJ SOSY
200.0000 ug | PREFILLED_SYRINGE | Freq: Once | INTRAMUSCULAR | Status: AC
  Administered 2023-04-26: 200 ug via SUBCUTANEOUS
  Filled 2023-04-26: qty 0.4

## 2023-04-26 MED ORDER — HYDROMORPHONE HCL 2 MG PO TABS: 2.0000 mg | ORAL_TABLET | Freq: Four times a day (QID) | ORAL | Status: DC | PRN

## 2023-04-26 MED ORDER — LIDOCAINE-PRILOCAINE 2.5-2.5 % EX CREA: 1.0000 | TOPICAL_CREAM | CUTANEOUS | Status: DC | PRN

## 2023-04-26 MED ORDER — NEPRO/CARBSTEADY PO LIQD: 237.0000 mL | ORAL | Status: DC | PRN

## 2023-04-26 MED ORDER — MEDIHONEY WOUND/BURN DRESSING EX PSTE
1.0000 | PASTE | Freq: Every day | CUTANEOUS | Status: DC
  Administered 2023-04-26 – 2023-05-03 (×8): 1 via TOPICAL
  Filled 2023-04-26 (×2): qty 44

## 2023-04-26 MED ORDER — HEPARIN SODIUM (PORCINE) 1000 UNIT/ML DIALYSIS: 1000.0000 [IU] | INTRAMUSCULAR | Status: DC | PRN

## 2023-04-26 MED ORDER — GERHARDT'S BUTT CREAM
TOPICAL_CREAM | Freq: Two times a day (BID) | CUTANEOUS | Status: DC
  Administered 2023-04-29: 1 via TOPICAL
  Filled 2023-04-26: qty 60

## 2023-04-26 MED ORDER — MAGIC MOUTHWASH W/LIDOCAINE
5.0000 mL | Freq: Four times a day (QID) | ORAL | Status: DC
  Filled 2023-04-26 (×35): qty 5

## 2023-04-26 MED ORDER — HYDROMORPHONE HCL 1 MG/ML IJ SOLN
0.5000 mg | Freq: Once | INTRAMUSCULAR | Status: AC | PRN
  Administered 2023-04-26: 0.5 mg via INTRAVENOUS
  Filled 2023-04-26: qty 0.5

## 2023-04-26 MED ORDER — ALBUMIN HUMAN 25 % IV SOLN
25.0000 g | Freq: Once | INTRAVENOUS | Status: AC
  Administered 2023-04-26: 25 g via INTRAVENOUS
  Filled 2023-04-26: qty 100

## 2023-04-26 MED ORDER — FAMOTIDINE 20 MG PO TABS
20.0000 mg | ORAL_TABLET | Freq: Every day | ORAL | Status: DC
  Filled 2023-04-26: qty 1

## 2023-04-26 MED ORDER — HYDROMORPHONE HCL 2 MG PO TABS
2.0000 mg | ORAL_TABLET | Freq: Four times a day (QID) | ORAL | Status: DC | PRN
  Administered 2023-04-26: 2 mg via ORAL
  Filled 2023-04-26 (×4): qty 1

## 2023-04-26 MED ORDER — HEPARIN SODIUM (PORCINE) 1000 UNIT/ML IJ SOLN
3800.0000 [IU] | INTRAMUSCULAR | Status: DC | PRN
  Administered 2023-04-26 – 2023-04-28 (×2): 3800 [IU]
  Filled 2023-04-26 (×2): qty 4

## 2023-04-26 MED ORDER — ANTICOAGULANT SODIUM CITRATE 4% (200MG/5ML) IV SOLN: 5.0000 mL | Status: DC | PRN

## 2023-04-26 MED ORDER — TORSEMIDE 20 MG PO TABS
100.0000 mg | ORAL_TABLET | ORAL | Status: DC
  Filled 2023-04-26: qty 1
  Filled 2023-04-26: qty 5

## 2023-04-26 MED ORDER — ALTEPLASE 2 MG IJ SOLR: 2.0000 mg | Freq: Once | INTRAMUSCULAR | Status: DC | PRN

## 2023-04-26 MED ORDER — LIDOCAINE HCL (PF) 1 % IJ SOLN: 5.0000 mL | INTRAMUSCULAR | Status: DC | PRN

## 2023-04-26 NOTE — Progress Notes (Signed)
[facility] Progress Note   Subjective: Seen on HD. Attempting UFG 4 liters but BP soft. Will lower UFG, give extra midodrine. Alert, No C/Os.    Objective Vitals:   04/26/23 0850 04/26/23 0904 04/26/23 0930 04/26/23 1000  BP: 121/66 116/73 98/63 108/69  Pulse: 97 93 87 88  Resp: 12 10 16 12   Temp: (!) 97.3 F (36.3 C)     TempSrc:      SpO2: 98% 100% 98% 100%  Weight:       Physical Exam General: Chronically ill appearing female in NAD Heart: S1,S2 No M/R/G SR Lungs: CTAB  Abdomen: Obese NABs Extremities: 2+ BLE edema Dialysis Access: [facility] drsg intact   Additional Objective Labs: Basic Metabolic Panel: Recent Labs  Lab 04/25/23 1409 04/25/23 1624 04/26/23 0619  NA 135 136 135  K 2.9* 2.8* 3.0*  CL 105 107 105  CO2 17*  --  17*  GLUCOSE 131* 115* 90  BUN 46* 41* 48*  CREATININE 8.66* 9.50* 8.88*  CALCIUM 8.2*  --  8.5*   Liver Function Tests: Recent Labs  Lab 04/25/23 1409 04/26/23 0619  AST 9* 9*  ALT 8 7  ALKPHOS 80 78  BILITOT 0.4 0.5  PROT 6.5 6.1*  ALBUMIN 2.3* 2.1*   Recent Labs  Lab 04/25/23 1409  LIPASE 24   CBC: Recent Labs  Lab 04/25/23 1409 04/25/23 1624 04/26/23 0619  WBC 12.6*  --  12.9*  HGB 8.2* 9.2* 8.2*  HCT 27.2* 27.0* 25.9*  MCV 95.4  --  94.5  PLT 288  --  273   Blood Culture    Component Value Date/Time   SDES BLOOD BLOOD RIGHT HAND 02/17/2023 2243   SPECREQUEST  02/17/2023 2243    BOTTLES DRAWN AEROBIC AND ANAEROBIC Blood Culture results may not be optimal due to an inadequate volume of blood received in culture bottles   CULT  02/17/2023 2243    NO GROWTH 5 DAYS Performed at [facility] Lab, [address] [place], [place] [address]    REPTSTATUS 02/22/2023 FINAL 02/17/2023 2243    Cardiac Enzymes: No results for input(s): "CKTOTAL", "CKMB", "CKMBINDEX", "TROPONINI" in the last 168 hours. CBG: Recent Labs  Lab 04/25/23 2025 04/26/23 0706  GLUCAP 90 89   Iron Studies: No results for  input(s): "IRON", "TIBC", "TRANSFERRIN", "FERRITIN" in the last 72 hours. @lablastinr3 @ Studies/Results: CT CHEST ABDOMEN PELVIS WO CONTRAST Result Date: 04/25/2023 CLINICAL DATA:  Sepsis, chest pain, abdominal pain EXAM: CT CHEST, ABDOMEN AND PELVIS WITHOUT CONTRAST TECHNIQUE: Multidetector CT imaging of the chest, abdomen and pelvis was performed following the standard protocol without IV contrast. RADIATION DOSE REDUCTION: This exam was performed according to the departmental dose-optimization program which includes automated exposure control, adjustment of the mA and/or kV according to patient size and/or use of iterative reconstruction technique. COMPARISON:  03/22/2023 FINDINGS: CT CHEST FINDINGS Cardiovascular: Right side dialysis catheter again noted, unchanged. Heart is normal size. Scattered coronary artery calcifications. Aorta normal caliber. Mediastinum/Nodes: No mediastinal, hilar, or axillary adenopathy. Trachea and esophagus are unremarkable. Thyroid unremarkable. Lungs/Pleura: Scattered ground-glass opacities, likely areas of atelectasis. No confluent airspace opacities or effusions. Musculoskeletal: Chest wall soft tissues are unremarkable. No acute bony abnormality. CT ABDOMEN PELVIS FINDINGS Hepatobiliary: No focal liver abnormality is seen. Status post cholecystectomy. No biliary dilatation. Pancreas: No focal abnormality or ductal dilatation. Spleen: No focal abnormality.  Normal size. Adrenals/Urinary Tract: No adrenal abnormality. No focal renal abnormality. No stones or hydronephrosis. Urinary bladder is unremarkable.  Stomach/Bowel: Stomach, large and small bowel grossly unremarkable. Vascular/Lymphatic: No evidence of aneurysm or adenopathy. Reproductive: Stable uterine fibroid.  No acute findings. Other: No free fluid or free air. Musculoskeletal: No acute bony abnormality. IMPRESSION: No acute findings in the chest, abdomen or pelvis. Scattered aortic atherosclerosis crash that  scattered coronary artery disease. Scattered ground-glass opacities in the lungs, favor areas of atelectasis. Fibroid uterus. Electronically Signed   By: Charlett Nose M.D.   On: 04/25/2023 18:11   DG Chest 2 View Result Date: 04/25/2023 CLINICAL DATA:  Chest pain. EXAM: CHEST - 2 VIEW COMPARISON:  03/22/2023. FINDINGS: Bilateral lung fields are clear. No pulmonary edema. Bilateral costophrenic angles are clear. Normal cardio-mediastinal silhouette. No acute osseous abnormalities. The soft tissues are within normal limits. Right IJ hemodialysis catheter noted with its tip overlying the cavoatrial junction region, unchanged. IMPRESSION: No active cardiopulmonary disease. Electronically Signed   By: Jules Schick M.D.   On: 04/25/2023 15:57   Medications:  sodium chloride     anticoagulant sodium citrate     heparin sodium (porcine)      (feeding supplement) PROSource Plus  30 mL Oral BID BM   atorvastatin  10 mg Oral QPM   busPIRone  5 mg Oral TID   famotidine  40 mg Oral QAC breakfast   ferric citrate  210 mg Oral TID WC   gabapentin  100 mg Oral TID   metoprolol tartrate  25 mg Oral BID   midodrine  5 mg Oral TID WC   mirtazapine  15 mg Oral QHS   pantoprazole  40 mg Oral BID   sodium bicarbonate  650 mg Oral BID   sodium chloride flush  3 mL Intravenous Q12H   sodium chloride flush  3 mL Intravenous Q12H   HD orders: [facility] T,TH,S 4 hr 180NRe 400/800 3.0 K/2.5 Ca TDC - No Heparin  - Hectorol 5 mcg IV three times per week - Mircera 150 mcg IV q 2 weeks (last dose 04/09/2023)   Assessment/ Plan: Volume overload - d/t missing HD which were transportation issues per the pt. Mostly LE edema. Max UF w/ HD.  ESRD - on HD TTS. Next HD 04/28/2023 BP - chronic hypotension it appears. BP here is soft to low-normal. On midodrine 5 tid, takes BB for afib most likely.  Volume - 2-3+ LE edema, max UF w/ HD. Continue Torsemide on non HD days Anemia of esrd - Hb 8- 9 here. ESA was due 04/23/2023,  Give Darbe 200 mcg sQ today. Transfuse prn.  Secondary hyperparathyroidism - CCa in range, add on phos. Resume binders w/ meals.  Atrial fib - chronic. No anticoagulation.   Kaysen Sefcik H. Embry Manrique NP-C 04/26/2023, 10:05 AM  [employer] [phone]

## 2023-04-26 NOTE — Progress Notes (Signed)
Received patient in bed to unit.  Alert and oriented.  Informed consent signed and in chart.   TX duration: 3 hours  Patient tolerated well.  Transported back to the room  Alert, without acute distress.  Hand-off given to patient's nurse.   Access used: Right internal jugular HD cath Access issues: none  Total UF removed: 4L Medication(s) given: Albumin, Zofran, Dialudid, Atarax   04/26/23 1230  Vitals  Temp (!) 97.2 F (36.2 C)  BP 104/60  Pulse Rate (!) 102  Resp 17  Oxygen Therapy  SpO2 100 %  O2 Device Room Air  Post Treatment  Dialyzer Clearance Clear  Liters Processed 71.8  Fluid Removed (mL) 4000 mL  Tolerated HD Treatment Yes     Stacie Glaze LPN [facility] D[facility]

## 2023-04-26 NOTE — Progress Notes (Signed)
PT Cancellation Note  Patient Details Name: Deborah [name] MRN: [id number] DOB: 04/07/1965   Cancelled Treatment:    Reason Eval/Treat Not Completed: Patient at procedure or test/unavailable (HD)  Lillia Pauls, PT, DPT Acute Rehabilitation Services Office [phone]    Norval Morton 04/26/2023, 8:24 AM

## 2023-04-26 NOTE — Progress Notes (Signed)
 Pt receives out-pt HD at [facility] on TTS. Will assist as needed.   Olivia Canter Renal Navigator [phone]

## 2023-04-26 NOTE — Progress Notes (Signed)
Ok to reduce famotidine to 20mg  qday due to renal function per Dr. Alanda Slim.  Ulyses Southward, PharmD, BCIDP, AAHIVP, CPP Infectious Disease Pharmacist 04/26/2023 2:05 PM

## 2023-04-26 NOTE — Progress Notes (Signed)
PROGRESS NOTE  Deborah [name] MWU:[id number] DOB: 08-20-1965   PCP: Pcp, No  Patient is from: Home.  Lives alone.  Reports using [facility] lift for transfer.  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  Also reports poor p.o. intake in the setting of nausea, vomiting and diarrhea for about 2 days.  Patient lives home by herself and endorses she does not have anyone to help her around.  Not able to transfer using [facility] lift. In ED, hemodynamically stable.  No indication for emergent dialysis other than volume overload.  K2.9.  BUN 46.  Bicarb 19.  Initial troponin 23> 16.  KG featured sinus rhythm with PAC and LAFB.   CT chest, abdomen and pelvis without contrast no acute chest, abdomen or pelvis abnormality.  Scattered coronary artery disease.  Scattered groundglass opacity of the lung favoring atelectasis.  Fibroid uterus.   Nephrology consulted and patient was admitted for further care.  Subjective: Seen and examined earlier this morning before she went to dialysis.  Sitting on the edge of the bed.  Continues to report chest pain and abdominal pain.  She is requesting increased interval of pain medications.  Reports taking p.o. Dilaudid 4 mg 4-5 times a day at home.  She also reports difficulty swelling due to sore throat as a result of nausea and vomiting.  On the same time, she is requesting a diet to be changed to carb modified  and state of renal.  Objective: Vitals:   04/26/23 0841 04/26/23 0850 04/26/23 0904 04/26/23 0930  BP:  121/66 116/73 98/63  Pulse:  97 93 87  Resp:  12 10 16   Temp: (!) 97.3 F (36.3 C) (!) 97.3 F (36.3 C)    TempSrc:      SpO2:  98% 100% 98%  Weight:        Examination:  GENERAL: No apparent distress.  Nontoxic. HEENT: MMM.  No oropharyngeal lesion.  Vision and hearing grossly intact.  NECK: Supple.  No apparent JVD.  No tenderness or lymphadenopathy. RESP:  No IWOB.  Fair aeration bilaterally. CVS:  RRR. Heart sounds normal.  ABD/GI/GU: BS+. Abd soft, NTND.  MSK/EXT:  Moves extremities.  Right BKA.++  Edema. SKIN: no apparent skin lesion or wound NEURO: Awake, alert and oriented appropriately.  No apparent focal neuro deficit. PSYCH: Calm. Normal affect.   Procedures:  None  Microbiology summarized: None  Assessment and plan: Volume overload-secondary to missing dialysis.  No significant respiratory distress.  Makes urine. ESRD on dialysis-noncompliance-secondary to no home health and  transportation issues Chronic diastolic CHF: CT chest with scattered groundglass opacities.  Difficult to assess volume status. Chronic hypotension: On midodrine. -Volume management per nephrology. -Resume home diuretics as well. -PT/OT/TOC -Renal diet  Chest pain/abdominal pain: She reports significant pain but does not appear to be in that much distress.  Not sure how acute these are.  Reports taking Dilaudid 4 mg 4-5 times a day at home  although her most recent prescription on 1/21 is for 2 mg 4 times a day.  Slightly elevated troponin likely due to volume overload.  No acute ischemic finding on EKG.  CT chest, abdomen and pelvis reassuring. -Resume home Dilaudid after med rec -Will discontinue IV pain medications.  Nausea/vomiting/diarrhea: Has history of gastroparesis.  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  No reported episodes since admission.  CT  abdomen and pelvis reassuring. -Symptomatic management   Anemia of chronic disease secondary to ESRD: Stable. Recent Labs    02/28/23 1139 03/01/23 0446 03/02/23 0409 03/04/23 0512 03/05/23 0228 03/08/23 1345 03/22/23 2005 04/25/23 1409 04/25/23 1624 04/26/23 0619  HGB 9.9* 9.7* 8.1* 9.1* 9.5* 8.0* 8.1* 8.2* 9.2* 8.2*  -IV iron and EPO per nephrology. -Continue monitoring  IDDM-2: A1c 5.7% on 11/29.  CBG within normal range.  Seems to use sliding scale insulin at home.  CBG within normal range. Recent Labs  Lab 04/25/23 2025 04/26/23 0706  GLUCAP 90 89  -Would discontinue SSI and CBG monitoring  Paroxysmal atrial fibrillation: Not on anticoagulation due to prior GI bleed -Continue metoprolol 25 mg twice daily. -Optimize electrolytes.   GERD/history of GIB/AVM -Continue Protonix and pain present   Anxiety/depression/chronic pain syndrome: -Continue home meds-BuSpar, Atarax, gabapentin -Will resume home p.o. Dilaudid after med rec.   Gastroparesis: Unfortunately allergic to Reglan.  Also on opiate which might not be a good choice.  NASH (nonalcoholic steatohepatitis) -Continue to monitor hepatic function panel.   Right BKA/ambulatory dysfunction: Patient reports using [facility] lift for transfer.  Sitting on bedside during my evaluation. -PT/OT eval  Carcinoid tumor of abdomen/chronic diarrhea: Reports 2-3 BMs daily at baseline. -Continue loperamide.   PVD (peripheral vascular disease) (HCC) -Continue Lipitor  History of reactive airway disease: Stable. - continue Xopenex as needed    History of spinal stenosis with associated chronic pain syndrome -Continue gabapentin, Robaxin Dilaudid as needed   Chronic diabetic ulcer of left foot determined by examination (HCC) -Wound care consulted.  Morbid obesity: Elevated BMI with comorbidity as above Body mass index is 38.04 kg/m.   DVT prophylaxis:  SCDs Start: 04/25/23 1921 Place TED hose Start: 04/25/23  1921  Code Status: Full code Family Communication: None at the bedside Level of care: Telemetry Medical Status is: Inpatient Remains inpatient appropriate because: Volume overload, physical deconditioning   Final disposition: To be determined Consultants:  Nephrology  55 minutes with more than 50% spent in reviewing records, counseling patient/family and coordinating care.   Sch Meds:  Scheduled Meds:  (feeding supplement) PROSource Plus  30 mL Oral BID BM   atorvastatin  10 mg Oral QPM   busPIRone  5 mg Oral TID   famotidine  40 mg Oral QAC breakfast   ferric citrate  210 mg Oral TID WC   gabapentin  100 mg Oral TID   insulin aspart  0-5 Units Subcutaneous QHS   insulin aspart  0-6 Units Subcutaneous TID WC   metoprolol tartrate  25 mg Oral BID   midodrine  5 mg Oral TID WC   mirtazapine  15 mg Oral QHS   pantoprazole  40 mg Oral BID   sodium bicarbonate  650 mg Oral BID   sodium chloride flush  3 mL Intravenous Q12H   sodium chloride flush  3 mL Intravenous Q12H   Continuous Infusions:  sodium chloride     albumin human     anticoagulant sodium citrate     heparin sodium (porcine)  PRN Meds:.sodium chloride, alteplase, anticoagulant sodium citrate, feeding supplement (NEPRO CARB STEADY), heparin, heparin sodium (porcine), HYDROmorphone (DILAUDID) injection, hydrOXYzine, levalbuterol, lidocaine (PF), lidocaine-prilocaine, liver oil-zinc oxide, loperamide, methocarbamol, ondansetron **OR** ondansetron (ZOFRAN) IV, pentafluoroprop-tetrafluoroeth, sodium chloride flush  Antimicrobials: Anti-infectives (From admission, onward)    None        I have personally reviewed the following labs and images: CBC: Recent Labs  Lab 04/25/23 1409 04/25/23 1624 04/26/23 0619  WBC 12.6*  --  12.9*  HGB 8.2* 9.2* 8.2*  HCT 27.2* 27.0* 25.9*  MCV 95.4  --  94.5  PLT 288  --  273   BMP &GFR Recent Labs  Lab 04/25/23 1409 04/25/23 1624 04/26/23 0619  NA 135 136 135   K 2.9* 2.8* 3.0*  CL 105 107 105  CO2 17*  --  17*  GLUCOSE 131* 115* 90  BUN 46* 41* 48*  CREATININE 8.66* 9.50* 8.88*  CALCIUM 8.2*  --  8.5*   Estimated Creatinine Clearance: 8.4 mL/min (A) (by C-G formula based on SCr of 8.88 mg/dL (H)). Liver & Pancreas: Recent Labs  Lab 04/25/23 1409 04/26/23 0619  AST 9* 9*  ALT 8 7  ALKPHOS 80 78  BILITOT 0.4 0.5  PROT 6.5 6.1*  ALBUMIN 2.3* 2.1*   Recent Labs  Lab 04/25/23 1409  LIPASE 24   No results for input(s): "AMMONIA" in the last 168 hours. Diabetic: No results for input(s): "HGBA1C" in the last 72 hours. Recent Labs  Lab 04/25/23 2025 04/26/23 0706  GLUCAP 90 89   Cardiac Enzymes: No results for input(s): "CKTOTAL", "CKMB", "CKMBINDEX", "TROPONINI" in the last 168 hours. No results for input(s): "PROBNP" in the last 8760 hours. Coagulation Profile: No results for input(s): "INR", "PROTIME" in the last 168 hours. Thyroid Function Tests: No results for input(s): "TSH", "T4TOTAL", "FREET4", "T3FREE", "THYROIDAB" in the last 72 hours. Lipid Profile: No results for input(s): "CHOL", "HDL", "LDLCALC", "TRIG", "CHOLHDL", "LDLDIRECT" in the last 72 hours. Anemia Panel: No results for input(s): "VITAMINB12", "FOLATE", "FERRITIN", "TIBC", "IRON", "RETICCTPCT" in the last 72 hours. Urine analysis:    Component Value Date/Time   COLORURINE STRAW (A) 06/19/2022 0638   APPEARANCEUR HAZY (A) 06/19/2022 0638   LABSPEC 1.010 06/19/2022 0638   PHURINE 5.0 06/19/2022 0638   GLUCOSEU NEGATIVE 06/19/2022 0638   HGBUR SMALL (A) 06/19/2022 0638   BILIRUBINUR NEGATIVE 06/19/2022 0638   KETONESUR NEGATIVE 06/19/2022 0638   PROTEINUR 30 (A) 06/19/2022 0638   UROBILINOGEN 0.2 10/02/2013 2108   NITRITE NEGATIVE 06/19/2022 0638   LEUKOCYTESUR LARGE (A) 06/19/2022 0638   Sepsis Labs: Invalid input(s): "PROCALCITONIN", "LACTICIDVEN"  Microbiology: No results found for this or any previous visit (from the past 240  hours).  Radiology Studies: CT CHEST ABDOMEN PELVIS WO CONTRAST Result Date: 04/25/2023 CLINICAL DATA:  Sepsis, chest pain, abdominal pain EXAM: CT CHEST, ABDOMEN AND PELVIS WITHOUT CONTRAST TECHNIQUE: Multidetector CT imaging of the chest, abdomen and pelvis was performed following the standard protocol without IV contrast. RADIATION DOSE REDUCTION: This exam was performed according to the departmental dose-optimization program which includes automated exposure control, adjustment of the mA and/or kV according to patient size and/or use of iterative reconstruction technique. COMPARISON:  03/22/2023 FINDINGS: CT CHEST FINDINGS Cardiovascular: Right side dialysis catheter again noted, unchanged. Heart is normal size. Scattered coronary artery calcifications. Aorta normal caliber. Mediastinum/Nodes: No mediastinal, hilar, or axillary adenopathy. Trachea and esophagus are unremarkable. Thyroid unremarkable. Lungs/Pleura: Scattered ground-glass opacities, likely areas of atelectasis. No confluent airspace opacities or effusions.  Musculoskeletal: Chest wall soft tissues are unremarkable. No acute bony abnormality. CT ABDOMEN PELVIS FINDINGS Hepatobiliary: No focal liver abnormality is seen. Status post cholecystectomy. No biliary dilatation. Pancreas: No focal abnormality or ductal dilatation. Spleen: No focal abnormality.  Normal size. Adrenals/Urinary Tract: No adrenal abnormality. No focal renal abnormality. No stones or hydronephrosis. Urinary bladder is unremarkable. Stomach/Bowel: Stomach, large and small bowel grossly unremarkable. Vascular/Lymphatic: No evidence of aneurysm or adenopathy. Reproductive: Stable uterine fibroid.  No acute findings. Other: No free fluid or free air. Musculoskeletal: No acute bony abnormality. IMPRESSION: No acute findings in the chest, abdomen or pelvis. Scattered aortic atherosclerosis crash that scattered coronary artery disease. Scattered ground-glass opacities in the lungs,  favor areas of atelectasis. Fibroid uterus. Electronically Signed   By: Charlett Nose M.D.   On: 04/25/2023 18:11   DG Chest 2 View Result Date: 04/25/2023 CLINICAL DATA:  Chest pain. EXAM: CHEST - 2 VIEW COMPARISON:  03/22/2023. FINDINGS: Bilateral lung fields are clear. No pulmonary edema. Bilateral costophrenic angles are clear. Normal cardio-mediastinal silhouette. No acute osseous abnormalities. The soft tissues are within normal limits. Right IJ hemodialysis catheter noted with its tip overlying the cavoatrial junction region, unchanged. IMPRESSION: No active cardiopulmonary disease. Electronically Signed   By: Jules Schick M.D.   On: 04/25/2023 15:57      Nyeema Want T. Eleanora Guinyard [facility]  If 7PM-7AM, please contact night-coverage [email or web address] 04/26/2023, 9:41 AM

## 2023-04-26 NOTE — Progress Notes (Signed)
OT Cancellation Note  Patient Details Name: IGNACIA GENTZLER MRN: [id number] DOB: 05/05/1965   Cancelled Treatment:    Reason Eval/Treat Not Completed: Patient at procedure or test/ unavailable.  Transport in for HD.  Will continue efforts.    Makinzy Cleere D Yoshio Seliga 04/26/2023, 8:21 AM 04/26/2023  RP, OTR/L  Acute Rehabilitation Services  Office:  [phone]

## 2023-04-26 NOTE — Progress Notes (Signed)
Unable to place CHG daily bath order; it's on pt.'s allergy list

## 2023-04-26 NOTE — Consult Note (Signed)
WOC Nurse Consult Note: patient is well known to [facility] team from previous admissions; is followed by Dr. Burna Sis office last seen 04/06/2023 with Vashe and Medihoney to L lateral heel ulcer  Reason for Consult: L foot wound Wound type: full thickness ulcer likely r/t diabetes  Pressure Injury POA: NA  Measurement: 2 cm x 2 cm 100% black eschar  Wound bed: black eschar  Drainage (amount, consistency, odor) none dry  Periwound: intact  Dressing procedure/placement/frequency: Clean L lateral heel wound with Vashe wound cleanser [place] [phone]), apply Medihoney to wound bed daily, cover with dry gauze and silicone foam or dry gauze and Kerlix roll gauze whichever patient prefers.   Patient has had a history of skin breakdown to buttocks, patient denies any wounds on her buttocks/sacrum at this time but does require Gerhardt's Butt Cream to help prevent breakdown while inpatient.  Will order this as well.   POC discussed with bedside nurse. WOC team will not follow. Re-consult if further needs arise.   Thank you,    Priscella Mann MSN, RN-BC, [employer] [phone]

## 2023-04-27 DIAGNOSIS — R112 Nausea with vomiting, unspecified: Secondary | ICD-10-CM

## 2023-04-27 DIAGNOSIS — R1084 Generalized abdominal pain: Secondary | ICD-10-CM

## 2023-04-27 DIAGNOSIS — R52 Pain, unspecified: Secondary | ICD-10-CM

## 2023-04-27 DIAGNOSIS — R1310 Dysphagia, unspecified: Secondary | ICD-10-CM

## 2023-04-27 DIAGNOSIS — R197 Diarrhea, unspecified: Secondary | ICD-10-CM

## 2023-04-27 LAB — GLUCOSE, CAPILLARY
Glucose-Capillary: 88 mg/dL (ref 70–99)
Glucose-Capillary: 81 mg/dL (ref 70–99)
Glucose-Capillary: 92 mg/dL (ref 70–99)
Glucose-Capillary: 96 mg/dL (ref 70–99)

## 2023-04-27 LAB — HEPATITIS B SURFACE ANTIBODY, QUANTITATIVE: Hep B S AB Quant (Post): 517 m[IU]/mL

## 2023-04-27 MED ORDER — ACETAMINOPHEN 10 MG/ML IV SOLN
1000.0000 mg | Freq: Once | INTRAVENOUS | Status: DC
  Filled 2023-04-27: qty 100

## 2023-04-27 MED ORDER — FAMOTIDINE IN NACL 20-0.9 MG/50ML-% IV SOLN
20.0000 mg | Freq: Every day | INTRAVENOUS | Status: DC
  Administered 2023-04-27 – 2023-05-03 (×5): 20 mg via INTRAVENOUS
  Filled 2023-04-27 (×7): qty 50

## 2023-04-27 MED ORDER — HYDROMORPHONE HCL 1 MG/ML IJ SOLN
0.5000 mg | INTRAMUSCULAR | Status: DC | PRN
  Administered 2023-04-27 – 2023-04-28 (×7): 0.5 mg via INTRAVENOUS
  Filled 2023-04-27 (×7): qty 0.5

## 2023-04-27 MED ORDER — HYDROMORPHONE HCL 1 MG/ML IJ SOLN: 0.5000 mg | Freq: Once | INTRAMUSCULAR | Status: DC | PRN

## 2023-04-27 MED ORDER — PANTOPRAZOLE SODIUM 40 MG IV SOLR
40.0000 mg | Freq: Two times a day (BID) | INTRAVENOUS | Status: DC
  Administered 2023-04-27 – 2023-05-03 (×13): 40 mg via INTRAVENOUS
  Filled 2023-04-27 (×12): qty 10

## 2023-04-27 NOTE — H&P (View-Only) (Signed)
Referring Provider: Dr. Hartley Barefoot Primary Care Physician:  Pcp, No Primary Gastroenterologist: Gentry Fitz  Reason for Consultation: Nausea, vomiting, dysphagia and chronic abdominal pain   HPI: Deborah [name] is a 58 y.o. female with a past medical history of fibromyalgia, gout, hypertension, CHF, atrial fibrillation not on anticoagulation, CVA, diabetes mellitus type 2, ESRD on HD, PAD s/p right BKA, B12 deficiency, NAFLD with possible cirrhosis, chronic anemia, GERD, duodenal carcinoid tumor and colon polyps.   She presented to the ED 04/25/2023 via EMS with chest pain, shortness of breath, abdominal pain with lower extremity swelling.  She missed dialysis x 2 weeks ago because her friend who drives her was out of town.  Labs in the ED showed a WBC count of 12.6.  Hemoglobin 8.2 with a hemoglobin level 8.58-month ago.  Hematocrit 27.2.  Platelet 288.  Potassium 2.9.  BUN 46 up from 17.  Creatinine 8.66 up from 5.21.  Albumin 2.3.  Total bili 0.4.  Alk phos 80.  AST 9.  ALT 8.  Lipase 24.  Troponin 23 and 16.  Chest x-ray was negative.  Chest/abdominal/pelvic CT without contrast without evidence of acute intra-abdominal/pelvic pathology to explain her symptoms.  No evidence of PE.  Scattered groundglass opacities in the lungs suggestive of atelectasis and scattered aortic atherosclerosis with scattered CAD noted.  She was clinically fluid overloaded and neurology was consulted.  She was dialyzed 04/26/2023.  GI consult was requested for further evaluation regarding nausea, vomiting, dysphagia and chronic abdominal pain.  He is a challenging historian. She has a history of GERD for which she takes Protonix 40 mg once daily.  She has intermittent heartburn which is chronic. She developed new onset dysphagia 1 to 2 weeks ago, she describes having food, liquid and pills which gets stuck in her throat/upper esophagus which results in coughing or gagging and the stuck matter comes back out.  No  recent steroid use.  She took an antibiotic for 2 weeks within the past month for a chronic left foot wound. No bloody or black stools. She stated having diarrhea 2 to 3 times daily for the past 2 weeks.  She endorses having generalized abdominal pain for the past few weeks.  She was previously admitted to the hospital 12/2022 with acute on chronic abdominal pain and diarrhea.  Hemoglobin 5.8.  CTAP without contrast was unrevealing.  She underwent an EGD during this hospitalization 12/23/2022 which identified grade A esophagitis, 3 bleeding gastric AVMs treated with APC and gastritis.  She underwent a prior EGD due to complaints of coffee-ground emesis this was done per Dr. Dulce Sellar, had grade a esophagitis, very little view of the stomach due to retained food. She had been known previously to Dr. 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 Patient was unhappy with Dr. Rich Brave care and says she sought a second opinion in [place] and had seen a gastroenterologist up there who did an endoscopic resection.   Her most recent colonoscopy was 07/30/2019 which notified 3 polyps measuring 2 to 10 mm removed from the colon.  GI PROCEDURES:   EGD 12/23/2022 as an inpatient by Dr. Barron Alvine:  - Normal upper third of esophagus and middle third of esophagus.  - LA Grade A esophagitis with no bleeding.  - Three bleeding angioectasias in the stomach. Treated with argon plasma coagulation (APC).  - Gastritis. Treated with argon plasma  coagulation (APC).  - Normal examined duodenum.  - Successful completion of the Video Capsule Enteroscope placement.  - No specimens collected.  - Colonoscopy not performed today due to solid stools.   GIVENS SMALL BOWL CAPSULE ENDOSCOPY 12/23/2022:   EGD 07/26/2020:  - LA Grade A esophagitis with no bleeding. Suspected cause of black emesis.  - A large amount of  food (residue) in the stomach.  - Retained food in the duodenum.   A. DUODENUM, POLYPECTOMY:  - Low grade neuroendocrine tumor (carcinoid tumor), broadly present at  multiple tissue edges.  - Lymphovascular invasion is identified.  - See comment.    EGD/[facility]US at [facility] in August 2021 where they did biopsies, but they terminated the procedure due to difficulty breathing.  They had her return a week later on 11/06/2019 for repeat procedure with general anesthesia.  Records are in Care Everywhere.     EGD 08/02/2019:  - Z-line regular, 36 cm from the incisors.  - Normal mucosa was found in the entire stomach.  - Non-bleeding duodenal ulcer with no stigmata of bleeding.  - A single duodenal polyp. Resected and retrieved. Clip was placed.  - Normal second portion of the duodenum.   Colonoscopy 07/30/2019:  - Preparation of the colon was fair.  - The examined portion of the ileum was normal.  - Two 2 to 3 mm polyps in the descending colon. Biopsied.  - One 10 mm polyp in the proximal sigmoid colon, removed with a hot snare. Resected and retrieved.  - Internal hemorrhoids   Path report consistent with tubular adenomas                              Past Medical History:  Diagnosis Date   Acute back pain with sciatica, left    Acute back pain with sciatica, right    Acute encephalopathy 05/29/2022   Acute osteomyelitis of right calcaneus (HCC) 12/21/2022   Anemia, unspecified    Atrial fibrillation with RVR (HCC)-resolved 09/09/2022   Atypical chest pain 09/10/2021   Cancer (HCC)    Carcinoid tumor of duodenum    Chest pain with normal coronary angiography 2019   Chronic a-fib (HCC) 09/09/2022   Chronic pain    Chronic systolic CHF (congestive heart failure) (HCC)    Dehiscence of amputation stump of right lower extremity (HCC) 01/29/2023   Diabetes mellitus    DKA (diabetic ketoacidosis) (HCC)    Drug-seeking behavior    21 hospitalizations and 14 CT a/p in 2 years  for N/V and abdominal pain, demanding only IV dilaudid   Elevated troponin    chronic   Esophageal reflux    Fibromyalgia    Gastric ulcer    Gastroparesis    Gout    HCAP (healthcare-associated pneumonia) 06/19/2022   Hyperlipidemia    Hyperosmolar hyperglycemic state (HHS) (HCC) 05/11/2022   Hyperosmolar non-ketotic state due to type 2 diabetes mellitus (HCC) 05/11/2022   Hypertension    Hypomagnesemia    Lumbosacral stenosis    LVH (left ventricular hypertrophy)    Morbid obesity (HCC)    Nausea & vomiting 09/09/2022   NICM (nonischemic cardiomyopathy) (HCC)    PAF (paroxysmal atrial fibrillation) (HCC)    Sepsis (HCC) 11/23/2022   Stroke (HCC) 02/2011   Symptomatic anemia 12/14/2022   Thrombocytosis    Vitamin B12 deficiency anemia     Past Surgical History:  Procedure Laterality Date   ABDOMINAL AORTOGRAM  W/LOWER EXTREMITY N/A 11/29/2022   Procedure: ABDOMINAL AORTOGRAM W/LOWER EXTREMITY;  Surgeon: Daria Pastures, MD;  Location: [facility] INVASIVE CV LAB;  Service: Cardiovascular;  Laterality: N/A;   AMPUTATION Right 12/29/2022   Procedure: RIGHT BELOW KNEE AMPUTATION;  Surgeon: Nadara Mustard, MD;  Location: [facility] OR;  Service: Orthopedics;  Laterality: Right;   AV FISTULA PLACEMENT Left 06/30/2022   Procedure: LEFT BRACHIOCEPHALIC ARTERIOVENOUS (AV) FISTULA CREATION;  Surgeon: Cephus Shelling, MD;  Location: [facility] OR;  Service: Vascular;  Laterality: Left;   BIOPSY  07/27/2019   Procedure: BIOPSY;  Surgeon: Vida Rigger, MD;  Location: [facility] ENDOSCOPY;  Service: Endoscopy;;   BIOPSY  07/30/2019   Procedure: BIOPSY;  Surgeon: Kathi Der, MD;  Location: [facility] ENDOSCOPY;  Service: Gastroenterology;;   CATARACT EXTRACTION  01/2014   CHOLECYSTECTOMY     COLONOSCOPY WITH PROPOFOL N/A 07/30/2019   Procedure: COLONOSCOPY WITH PROPOFOL;  Surgeon: Kathi Der, MD;  Location: [facility] ENDOSCOPY;  Service: Gastroenterology;  Laterality: N/A;   ESOPHAGOGASTRODUODENOSCOPY N/A 07/27/2019    Procedure: ESOPHAGOGASTRODUODENOSCOPY (EGD);  Surgeon: Vida Rigger, MD;  Location: [facility] ENDOSCOPY;  Service: Endoscopy;  Laterality: N/A;   ESOPHAGOGASTRODUODENOSCOPY N/A 07/26/2020   Procedure: ESOPHAGOGASTRODUODENOSCOPY (EGD);  Surgeon: Willis Modena, MD;  Location: [facility] ENDOSCOPY;  Service: Endoscopy;  Laterality: N/A;   ESOPHAGOGASTRODUODENOSCOPY (EGD) WITH PROPOFOL N/A 08/02/2019   Procedure: ESOPHAGOGASTRODUODENOSCOPY (EGD) WITH PROPOFOL;  Surgeon: Kathi Der, MD;  Location: [facility] ENDOSCOPY;  Service: Gastroenterology;  Laterality: N/A;   ESOPHAGOGASTRODUODENOSCOPY (EGD) WITH PROPOFOL N/A 12/23/2022   Procedure: ESOPHAGOGASTRODUODENOSCOPY (EGD) WITH PROPOFOL;  Surgeon: Shellia Cleverly, DO;  Location: [facility] ENDOSCOPY;  Service: Gastroenterology;  Laterality: N/A;   FISTULA SUPERFICIALIZATION Left 12/31/2022   Procedure: LEFT ARM FISTULA TRANSPOSITION;  Surgeon: Maeola Harman, MD;  Location[facility] OR;  Service: Vascular;  Laterality: Left;   GIVENS CAPSULE STUDY N/A 12/23/2022   Procedure: GIVENS CAPSULE STUDY;  Surgeon: Shellia Cleverly, DO;  Location: [facility] ENDOSCOPY;  Service: Gastroenterology;  Laterality: N/A;   HEMOSTASIS CLIP PLACEMENT  08/02/2019   Procedure: HEMOSTASIS CLIP PLACEMENT;  Surgeon: Kathi Der, MD;  Location: [facility] ENDOSCOPY;  Service: Gastroenterology;;   HOT HEMOSTASIS N/A 12/23/2022   Procedure: HOT HEMOSTASIS (ARGON PLASMA COAGULATION/BICAP);  Surgeon: Shellia Cleverly, DO;  Location: [facility] ENDOSCOPY;  Service: Gastroenterology;  Laterality: N/A;   IR FLUORO GUIDE CV LINE RIGHT  06/24/2022   IR US GUIDE VASC ACCESS RIGHT  06/24/2022   POLYPECTOMY  07/30/2019   Procedure: POLYPECTOMY;  Surgeon: Kathi Der, MD;  Location: [facility] ENDOSCOPY;  Service: Gastroenterology;;   POLYPECTOMY  08/02/2019   Procedure: POLYPECTOMY;  Surgeon: Kathi Der, MD;  Location: [facility] ENDOSCOPY;  Service: Gastroenterology;;   STUMP REVISION Right 02/02/2023   Procedure: REVISION  RIGHT BELOW KNEE AMPUTATION;  Surgeon: Nadara Mustard, MD;  Location: [facility] OR;  Service: Orthopedics;  Laterality: Right;    Prior to Admission medications   Medication Sig Start Date End Date Taking? Authorizing Provider  albuterol (PROVENTIL) (2.5 MG/3ML) 0.083% nebulizer solution Take 3 mLs (2.5 mg total) by nebulization every 6 (six) hours as needed for wheezing or shortness of breath. 04/06/19  Yes Bing Neighbors, NP  allopurinol (ZYLOPRIM) 100 MG tablet Take 1 tablet (100 mg total) by mouth 2 (two) times daily. 11/30/22  Yes Azucena Fallen, MD  ascorbic acid (VITAMIN C) 500 MG tablet Take 500 mg by mouth daily.   Yes [provider]  atorvastatin (LIPITOR) 10 MG tablet Take 10 mg by mouth every evening. 12/03/21  Yes [provider]  busPIRone (BUSPAR) 5 MG tablet Take 5 mg by mouth 3 (three) times daily.   Yes [provider]  carvedilol (COREG) 12.5 MG tablet Take 12.5 mg by mouth 2 (two) times daily with a meal.   Yes [provider]  cetirizine (ZYRTEC) 10 MG tablet Take 10 mg by mouth daily. 08/05/22  Yes [provider]  famotidine (PEPCID) 20 MG tablet Take 20 mg by mouth daily before breakfast.   Yes [provider]  ferric citrate (AURYXIA) 1 GM 210 MG(Fe) tablet Take 2 tablets (420 mg total) by mouth 3 (three) times daily with meals. Patient taking differently: Take 210 mg by mouth 3 (three) times daily with meals. 11/30/22  Yes Azucena Fallen, MD  fluticasone [facility]) 50 MCG/ACT nasal spray Place 2 sprays into both nostrils daily as needed for allergies or rhinitis. 12/19/18  Yes Rai, Ripudeep K, MD  folic acid (FOLVITE) 1 MG tablet Take 1 tablet (1 mg total) by mouth daily. 01/10/23  Yes Danford, Earl Lites, MD  hydrALAZINE (APRESOLINE) 50 MG tablet Take 50 mg by mouth 3 (three) times daily.   Yes [provider]  hydrOXYzine (ATARAX) 25 MG tablet Take 1 tablet (25 mg total) by mouth 3 (three) times daily  as needed for anxiety. Patient taking differently: Take 25 mg by mouth See admin instructions. Give 25 mg (1 tablet) every 6 hours as needed for anxiety, agitation for 14 days (starting 11/19), then every 8 hours as needed. 01/10/23  Yes Danford, Earl Lites, MD  insulin lispro (HUMALOG) 100 UNIT/ML KwikPen Before each meal 3 times a day, 140-199 - 2 units, 200-250 - 6 units, 251-299 - 8 units,  300-349 - 12 units,  350 or above 14 units. Patient taking differently: Inject 0-14 Units into the skin See admin instructions. Inject 0-14 units per sliding scale before meals: < 70 notify MD 70-139 : 0 units 140-199 : 2 units 200-250 : 6 units 251-299 : 8 units 300-349 : 12 units 350-400 : 14 units > 401 notify MD. 06/04/22  Yes Leroy Sea, MD  isosorbide mononitrate (IMDUR) 60 MG 24 hr tablet Take 60 mg by mouth daily.   Yes [provider]  Lactobacillus (ACIDOPHILUS) CAPS capsule Take 1 capsule by mouth 3 (three) times daily with meals. 03/08/23  Yes Elgergawy, Leana Roe, MD  leptospermum manuka honey (MEDIHONEY) PSTE paste Apply 1 Application topically daily. 03/08/23  Yes Elgergawy, Leana Roe, MD  loperamide (IMODIUM) 2 MG capsule Take 1 capsule (2 mg total) by mouth as needed for diarrhea or loose stools. 03/08/23  Yes Elgergawy, Leana Roe, MD  methocarbamol (ROBAXIN) 500 MG tablet Take 1 tablet (500 mg total) by mouth every 6 (six) hours as needed for muscle spasms. 01/10/23  Yes Danford, Earl Lites, MD  metoprolol tartrate (LOPRESSOR) 25 MG tablet Take 1 tablet (25 mg total) by mouth 2 (two) times daily. 03/08/23  Yes Elgergawy, Leana Roe, MD  midodrine (PROAMATINE) 5 MG tablet Take 5 mg by mouth 3 (three) times daily. 02/18/23  Yes [provider]  mirtazapine (REMERON) 15 MG tablet Take 15 mg by mouth at bedtime. 10/18/22  Yes [provider]  Multiple Vitamins-Minerals (SENIOR MULTIVITAMIN PLUS PO) Take 1 tablet by mouth daily.   Yes [provider]   nutrition supplement, JUVEN, (JUVEN) PACK Take 1 packet by mouth 2 (two) times daily between meals. 01/10/23  Yes Danford, Earl Lites, MD  Nutritional Supplements (,FEEDING SUPPLEMENT, PROSOURCE  PLUS) liquid Take 30 mLs by mouth 2 (two) times daily between meals. 01/10/23  Yes Danford, Earl Lites, MD  pantoprazole (PROTONIX) 40 MG tablet Take 1 tablet (40 mg total) by mouth 2 (two) times daily. 02/18/23  Yes Raulkar, Drema Pry, MD  spironolactone (ALDACTONE) 25 MG tablet Take 12.5 mg by mouth daily.   Yes [provider]  HYDROmorphone (DILAUDID) 2 MG tablet Wean Prescription. Dilaudid  2 mg one tablet 4 times a day as needed for pain  for 5 days. Dilaudid 2 mg one tablet 3 times a day as needed for pain for 5 days. Dilaudid 2 mg one tablet as needed for pain twice a day for 5 days. Dilaudid 2 mg one tablet daily for 5 days as needed for pain. Dilaudid 2 mg every other day for three days then discontinued. Patient not taking: Reported on 04/26/2023 03/29/23   Jones Bales, NP  hydrOXYzine (VISTARIL) 25 MG capsule Take 25 mg by mouth 3 (three) times daily as needed. Patient not taking: Reported on 04/26/2023 04/04/23   [provider]    Current Facility-Administered Medications  Medication Dose Route Frequency Provider Last Rate Last Admin   (feeding supplement) PROSource Plus liquid 30 mL  30 mL Oral BID BM Sundil, Subrina, MD       atorvastatin (LIPITOR) tablet 10 mg  10 mg Oral QPM Sundil, Subrina, MD       busPIRone (BUSPAR) tablet 5 mg  5 mg Oral TID Janalyn Shy, Subrina, MD   5 mg at 04/25/23 2035   famotidine (PEPCID) IVPB 20 mg premix  20 mg Intravenous QAC breakfast [name], Belkys A, MD       ferric citrate (AURYXIA) tablet 210 mg  210 mg Oral TID WC Sundil, Subrina, MD       gabapentin (NEURONTIN) capsule 100 mg  100 mg Oral TID Janalyn Shy, Subrina, MD       Gerhardt's butt cream   Topical BID Almon Hercules, MD   Given at 04/27/23 1204   heparin sodium (porcine)  injection 3,800 Units  3,800 Units Intracatheter Continuous PRN Delano Metz, MD   3,800 Units at 04/26/23 1151   HYDROmorphone (DILAUDID) injection 0.5 mg  0.5 mg Intravenous Q4H PRN [name], Belkys A, MD       HYDROmorphone (DILAUDID) tablet 2 mg  2 mg Oral Q6H PRN Candelaria Stagers T, MD   2 mg at 04/26/23 1854   hydrOXYzine (ATARAX) tablet 25 mg  25 mg Oral TID PRN Janalyn Shy, Subrina, MD   25 mg at 04/26/23 1125   leptospermum manuka honey (MEDIHONEY) paste 1 Application  1 Application Topical Daily Almon Hercules, MD   1 Application at 04/27/23 1212   levalbuterol (XOPENEX) nebulizer solution 0.63 mg  0.63 mg Nebulization Q6H PRN Sundil, Subrina, MD       liver oil-zinc oxide (DESITIN) 40 % ointment   Topical Q8H PRN Janalyn Shy, Subrina, MD       loperamide (IMODIUM) capsule 2 mg  2 mg Oral PRN Janalyn Shy, Subrina, MD       magic mouthwash w/lidocaine  5 mL Oral QID Candelaria Stagers T, MD       methocarbamol (ROBAXIN) tablet 500 mg  500 mg Oral Q6H PRN Janalyn Shy, Subrina, MD   500 mg at 04/25/23 2034   metoprolol tartrate (LOPRESSOR) tablet 25 mg  25 mg Oral BID Sundil, Subrina, MD       midodrine (PROAMATINE) tablet 5 mg  5 mg Oral TID WC Sundil,  Subrina, MD   5 mg at 04/26/23 1854   mirtazapine (REMERON) tablet 15 mg  15 mg Oral QHS Sundil, Subrina, MD       ondansetron [facility]) tablet 4 mg  4 mg Oral Q6H PRN Janalyn Shy, Subrina, MD       Or   ondansetron [facility]) injection 4 mg  4 mg Intravenous Q6H PRN Janalyn Shy, Subrina, MD   4 mg at 04/27/23 1216   pantoprazole (PROTONIX) injection 40 mg  40 mg Intravenous Q12H [name], Belkys A, MD   40 mg at 04/27/23 1156   sodium bicarbonate tablet 650 mg  650 mg Oral BID Sundil, Subrina, MD       sodium chloride flush (NS) 0.9 % injection 3 mL  3 mL Intravenous Q12H Sundil, Subrina, MD   3 mL at 04/26/23 2152   sodium chloride flush (NS) 0.9 % injection 3 mL  3 mL Intravenous Q12H Sundil, Subrina, MD   3 mL at 04/27/23 1157   sodium chloride flush (NS) 0.9 % injection 3 mL  3  mL Intravenous PRN Janalyn Shy, Subrina, MD       torsemide [facility]) tablet 100 mg  100 mg Oral Once per day on Sunday Monday Wednesday Friday Pola Corn, NP        Allergies as of 04/25/2023 - Review Complete 04/25/2023  Allergen Reaction Noted   Isovue [iopamidol] Anaphylaxis, [name], and Other (See Comments) 11/28/2017   Nsaids Anaphylaxis and Other (See Comments) 09/21/2015   Penicillins Itching, Palpitations, and Other (See Comments) 02/04/2011   Reglan [metoclopramide] Other (See Comments) 08/08/2019   Valium [diazepam] [name] 09/21/2011   Zestril [lisinopril] Anaphylaxis and Swelling 09/21/2011   Tolectin [tolmetin] Nausea And Vomiting, Nausea Only, and Other (See Comments) 09/21/2015   Asa [aspirin] Other (See Comments) 09/26/2015   Aspartame and phenylalanine Hives 09/10/2022   Bentyl [dicyclomine] Other (See Comments) 04/15/2022   Hibiclens [chlorhexidine gluconate] Other (See Comments)    Flexeril [cyclobenzaprine] Palpitations 05/17/2020   Oxycontin [oxycodone] Palpitations 10/09/2020   Rifamycins Palpitations 07/25/2020   Tylenol [acetaminophen] Nausea And Vomiting, Nausea Only, and Other (See Comments) 08/28/2015   Ultram [tramadol] Nausea And Vomiting and Palpitations 12/22/2015    Family History  Problem Relation Age of Onset   Diabetes Mother    Diabetes Father    Heart disease Father    Diabetes Sister    Congestive Heart Failure Sister 91   Diabetes Brother     Social History   Socioeconomic History   Marital status: Married    Spouse name: Not on file   Number of children: Not on file   Years of education: Not on file   Highest education level: Not on file  Occupational History   Not on file  Tobacco Use   Smoking status: Never   Smokeless tobacco: Never  Vaping Use   Vaping status: Never Used  Substance and Sexual Activity   Alcohol use: No   Drug use: No   Sexual activity: Not Currently    Birth  control/protection: None  Other Topics Concern   Not on file  Social History Narrative   ** Merged History Encounter **       Social Drivers of Health   Financial Resource Strain: Low Risk (03/23/2021)   [facility])   Financial Resource Strain  Food Insecurity: No Food Insecurity (04/25/2023)   Hunger Vital Sign    Worried About Running Out of Food in the Last  Year: Never true    Ran Out of Food in the Last Year: Never true  Recent Concern: Food Insecurity - High Risk (01/25/2023)   Received from [facility]   Hunger Vital Sign    Worried About Running Out of Food in the Last Year: Patient declined to answer    Ran Out of Food in the Last Year: Often true  Transportation Needs: Unmet Transportation Needs (04/25/2023)   PRAPARE - Administrator, Civil Service (Medical): Yes    Lack of Transportation (Non-Medical): Yes  Physical Activity: Not on File (10/31/2017)   Received from [place], [place]   Physical Activity    Physical Activity: 0  Stress: Low Risk (03/23/2021)   [facility])   Stress    Over the last 2 weeks, how often have you been bothered by the following problems: feeling nervous, anxious, on edge?: Not at all    Over the last 2 weeks, how often have you been bothered by the following problems: Not being able to stop or control worrying?: Not at all  Social Connections: Unknown (07/19/2021)   Received from [facility]   Social Network    Social Network: Not on file  Intimate Partner Violence: Not At Risk (04/25/2023)   Humiliation, Afraid, Rape, and Kick questionnaire    Fear of Current or Ex-Partner: No    Emotionally Abused: No    Physically Abused: No    Sexually Abused: No    Review of Systems: Gen: Denies fever, sweats or chills. No weight loss.  CV: Denies chest pain, palpitations or edema. Resp: Denies cough, shortness of breath of  hemoptysis.  GI: See HPI. GU : Denies urinary burning, blood in urine, increased urinary frequency or incontinence. MS: + Generalized body aches.  Derm: Denies rash, itchiness, skin lesions or unhealing ulcers. Psych:+ Anxiety and depression. Heme: Denies easy bruising, bleeding. Neuro:  Denies headaches, dizziness or paresthesias. Endo: + DM type II.   Physical Exam: Vital signs in last 24 hours: Temp:  [97.4 F (36.3 C)-98.7 F (37.1 C)] 97.4 F (36.3 C) (02/19 0744) Pulse Rate:  [58-100] 93 (02/19 0744) Resp:  [18] 18 (02/19 0744) BP: (86-107)/(51-74) 93/51 (02/19 0744) SpO2:  [91 %-100 %] 100 % (02/19 0744)   General: Alert 58 year old female sitting up in the chair intermittently moaning and crying out loud due to having generalized bodyaches and abdominal pain. Head:  Normocephalic and atraumatic. Eyes:  No scleral icterus. Conjunctiva pink. Ears:  Normal auditory acuity. Nose:  No deformity, discharge or lesions. Mouth: Absent dentition.  No ulcers or lesions.  No thrush. Neck:  Supple. No lymphadenopathy or thyromegaly.  Chest: Right subclavian dialysis catheter intact. Lungs: Breath sounds clear throughout. No wheezes, rhonchi or crackles.  Heart: Regular rhythm, no murmurs. Abdomen: BS abdomen, soft, generalized tenderness without rebound or guarding.  Positive bowel sounds to all 4 quadrants.  No bruit.  No palpable mass in setting of obese abdomen. Rectal: Deferred. Musculoskeletal:  Right BKA. Pulses:  Normal pulses noted. Extremities: No edema. LUE fistula with + bruit and thrill.  Left foot Ace wrap dry and intact. Neurologic:  Alert and  oriented x 4.  Skin:  Intact without significant lesions or rashes. Psych:  Alert and cooperative. Normal mood and affect.  Intake/Output from previous day: 02/18 0701 - 02/19 0700 In: -  Out: 4000  Intake/Output this shift: No intake/output data recorded.  Lab Results: Recent Labs  04/25/23 1409 04/25/23 1624  04/26/23 0619  WBC 12.6*  --  12.9*  HGB 8.2* 9.2* 8.2*  HCT 27.2* 27.0* 25.9*  PLT 288  --  273   BMET Recent Labs    04/25/23 1409 04/25/23 1624 04/26/23 0618 04/26/23 0619  NA 135 136 136 135  K 2.9* 2.8* 3.1* 3.0*  CL 105 107 104 105  CO2 17*  --  17* 17*  GLUCOSE 131* 115* 81 90  BUN 46* 41* 50* 48*  CREATININE 8.66* 9.50* 8.66* 8.88*  CALCIUM 8.2*  --  8.4* 8.5*   LFT Recent Labs    04/25/23 1409 04/26/23 0618 04/26/23 0619  PROT 6.5  --  6.1*  ALBUMIN 2.3*   < > 2.1*  AST 9*  --  9*  ALT 8  --  7  ALKPHOS 80  --  78  BILITOT 0.4  --  0.5  BILIDIR <0.1  --   --   IBILI NOT CALCULATED  --   --    < > = values in this interval not displayed.   PT/INR No results for input(s): "LABPROT", "INR" in the last 72 hours. Hepatitis Panel Recent Labs    04/25/23 1739  HEPBSAG NON REACTIVE      Studies/Results: CT CHEST ABDOMEN PELVIS WO CONTRAST Result Date: 04/25/2023 CLINICAL DATA:  Sepsis, chest pain, abdominal pain EXAM: CT CHEST, ABDOMEN AND PELVIS WITHOUT CONTRAST TECHNIQUE: Multidetector CT imaging of the chest, abdomen and pelvis was performed following the standard protocol without IV contrast. RADIATION DOSE REDUCTION: This exam was performed according to the departmental dose-optimization program which includes automated exposure control, adjustment of the mA and/or kV according to patient size and/or use of iterative reconstruction technique. COMPARISON:  03/22/2023 FINDINGS: CT CHEST FINDINGS Cardiovascular: Right side dialysis catheter again noted, unchanged. Heart is normal size. Scattered coronary artery calcifications. Aorta normal caliber. Mediastinum/Nodes: No mediastinal, hilar, or axillary adenopathy. Trachea and esophagus are unremarkable. Thyroid unremarkable. Lungs/Pleura: Scattered ground-glass opacities, likely areas of atelectasis. No confluent airspace opacities or effusions. Musculoskeletal: Chest wall soft tissues are unremarkable. No acute  bony abnormality. CT ABDOMEN PELVIS FINDINGS Hepatobiliary: No focal liver abnormality is seen. Status post cholecystectomy. No biliary dilatation. Pancreas: No focal abnormality or ductal dilatation. Spleen: No focal abnormality.  Normal size. Adrenals/Urinary Tract: No adrenal abnormality. No focal renal abnormality. No stones or hydronephrosis. Urinary bladder is unremarkable. Stomach/Bowel: Stomach, large and small bowel grossly unremarkable. Vascular/Lymphatic: No evidence of aneurysm or adenopathy. Reproductive: Stable uterine fibroid.  No acute findings. Other: No free fluid or free air. Musculoskeletal: No acute bony abnormality. IMPRESSION: No acute findings in the chest, abdomen or pelvis. Scattered aortic atherosclerosis crash that scattered coronary artery disease. Scattered ground-glass opacities in the lungs, favor areas of atelectasis. Fibroid uterus. Electronically Signed   By: Charlett Nose M.D.   On: 04/25/2023 18:11   DG Chest 2 View Result Date: 04/25/2023 CLINICAL DATA:  Chest pain. EXAM: CHEST - 2 VIEW COMPARISON:  03/22/2023. FINDINGS: Bilateral lung fields are clear. No pulmonary edema. Bilateral costophrenic angles are clear. Normal cardio-mediastinal silhouette. No acute osseous abnormalities. The soft tissues are within normal limits. Right IJ hemodialysis catheter noted with its tip overlying the cavoatrial junction region, unchanged. IMPRESSION: No active cardiopulmonary disease. Electronically Signed   By: Jules Schick M.D.   On: 04/25/2023 15:57    IMPRESSION/PLAN:  57 year old female with ESRD on hemodialysis admitted to the hospital 04/25/2023 with chest pain, shortness of breath and lower extremity edema  after missing 2 weeks of dialysis.  Troponin 23 -> 16.  EKG without acute ischemia.  Received dialysis and her respiratory status stabilized and no further chest pain. -Management per the hospitalist and nephrology  History of GERD with new onset dysphagia with solid  foods, liquids and pills for the past 1 to 2 weeks.  No recent steroids.  Patient reported taking antibiotics for 2 weeks for a chronic left foot wound within the past month.  On Protonix 40 mg daily at home.  Her most recent EGD 12/2022 showed grade a esophagitis and 3 gastric AVMs. Patient stated she cannot swallow barium for barium swallow study because she has anaphylactic reaction to oral contrast. -Consider EGD to rule out reflux versus candidiasis esophagitis/GERD/esophageal stricture.  Await further recommendations per Dr. Leonides Schanz -Pantoprazole 40 mg twice daily -Ondansetron 4 mg p.o. or IV every 6 hours as needed  N/V, likely secondary to missing dialysis x 2 weeks. No hematemesis.  -See plan above  Acute on chronic generalized abdominal pain.  WBC 12.9.  CTAP without contrast was unrevealing.   -Defer pain management to the hospitalist -CBC in am  Chronic anemia secondary to ESRD +/- chronic GI blood loss from gastric AVMs.  No overt GI bleeding.  No longer on Eliquis.  Hemoglobin 9.2 -> 8.2. -CBC in a.m. -Transfuse for hemoglobin less than 7 or as needed if symptomatic  Diarrhea, x 2 weeks.  Patient endorsed taking antibiotic for 2 weeks within the past month for chronic left foot ulcer. -Check GI pathogen panel and C. difficile   Hypokalemia -Management per the hospitalist   History of duodenal carcinoid mass s/p resection  History of tubular adenomatous polyps per colonoscopy 07/2019 -Outpatient colonoscopy  DM type II  Questionable history of NAFLD/cirrhosis.  CTAP without contrast showed a normal liver and spleen.  Normal LFTs.  Platelets 273.  Albumin 2.1.   Malachi Carl Kennedy-Smith  04/27/2023, 3:17 PM

## 2023-04-27 NOTE — Progress Notes (Signed)
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  Also reports poor oral intake in the setting of nausea, vomiting and diarrhea for 2 days.  She lives at home by herself and endorses she does not have anyone to help her around.  She is not able to transfer using [facility] lift.  She was found to have potassium of 2.9, BUN 46, bicarb 19, troponin 23--- 16.  CT chest abdomen and pelvis without contrast no acute chest abdomen and pelvis abnormality.  Scattered coronary artery disease, scattered groundglass opacity of the lung favor atelectasis.  Fibroid uterus.  Nephrology was consulted patient was admitted for hemodialysis and further care.    Assessment & Plan:   Principal Problem:   Volume overload Active Problems:   Chronic hypokalemia   Chronic diarrhea   ESRD on dialysis (HCC)   Anemia of chronic disease   Paroxysmal atrial fibrillation (HCC)   GERD (gastroesophageal reflux disease)   Insulin dependent type 2 diabetes mellitus (HCC)   Chronic gout   Gastroparesis   Chronic depression   NASH (nonalcoholic steatohepatitis)   Chronic pain syndrome   Hx of BKA, right (HCC)   Chronic diastolic CHF (congestive heart failure) (HCC)   Carcinoid tumor of abdomen   PVD (peripheral vascular disease) (HCC)   History of GI bleed   History of arteriovenous  malformation (AVM)   Generalized anxiety disorder   History of reactive airway disease   History of spinal stenosis   Chronic diabetic ulcer of left foot determined by examination (HCC)   1-Volume overload secondary to missing hemodialysis Acute on chronic diastolic heart failure CT chest with scattered groundglass opacity ESRD on hemodialysis, noncompliance due to transportation issues Volume managed with hemodialysis.  Nephrology consulted had hemodialysis 12/18  Chronic hypotension: Continue with midodrine  Chest pain/abdominal pain, nausea vomiting Reports that oral Dilaudid has not been helping with her pain.  She usually required few days of IV pain medication. CT abdomen and pelvis with no acute findings IV Protonix. Discussed with patient will do IV pain medication for 24 hours She has a history of carcinoid tumor  Nausea vomiting diarrhea: Suspect related to duodenal carcinoid tumor GI has been consulted  Dysphagia: Report inability to swallow pills and reports something got stuck GI has been consulted.   Anemia of chronic disease secondary to ESRD: Stable. Monitor hb  IDDM-2: A1c 5.7% on 11/29.  Monitor  CBG     Paroxysmal atrial fibrillation: Not on anticoagulation due to prior GI bleed -Continue metoprolol 25 mg twice daily. -Optimize electrolytes.   GERD/history of GIB/AVM -Continue Protonix and pain present   Anxiety/depression/chronic pain syndrome: -Continue home meds-BuSpar, Atarax, gabapentin Psych consulted,reported suicidal thought to SW  Gastroparesis: Unfortunately allergic to Reglan.  Also on opiate which might not be a good choice.   NASH (nonalcoholic steatohepatitis) -Continue to monitor hepatic function panel.   Right BKA/ambulatory dysfunction: Patient reports using [facility] lift for transfer.  Sitting on bedside during my evaluation. -PT/OT eval   Carcinoid tumor of abdomen/chronic diarrhea: Reports 2-3 BMs daily at baseline. -Continue  loperamide.   PVD (peripheral vascular disease) (HCC) -Continue Lipitor   History of reactive airway disease: Stable. - continue Xopenex as needed    History of spinal stenosis with associated chronic pain syndrome -Continue gabapentin, Robaxin Dilaudid as needed   Chronic diabetic ulcer of left foot determined by examination (HCC) -Wound care consulted.   Morbid obesity: Elevated BMI with comorbidity as above Body mass index is 38.04 kg/m.        Pressure Injury 11/23/22 Ischial tuberosity Left Unstageable - Full thickness tissue loss in which the base of the injury is covered by slough (yellow, tan, gray, green or brown) and/or eschar (tan, brown or black) in the wound bed. (Active)  11/23/22 0853  Location: Ischial tuberosity  Location Orientation: Left  Staging: Unstageable - Full thickness tissue loss in which the base of the injury is covered by slough (yellow, tan, gray, green or brown) and/or eschar (tan, brown or black) in the wound bed.  Wound Description (Comments):   Present on Admission: Yes     Pressure Injury 11/24/22 Ischial tuberosity Right Stage 2 -  Partial thickness loss of dermis presenting as a shallow open injury with a red, pink wound bed without slough. stage two injury to upper thigh into groin region. (Active)  11/24/22 1452  Location: Ischial tuberosity  Location Orientation: Right  Staging: Stage 2 -  Partial thickness loss of dermis presenting as a shallow open injury with a red, pink wound bed without slough.  Wound Description (Comments): stage two injury to upper thigh into groin region.  Present on Admission: Yes                  Estimated body mass index is 36.62 kg/m as calculated from the following:   Height as of 03/22/23: 5\' 6"  (1.676 m).   Weight as of this encounter: 102.9 kg.   DVT prophylaxis: SCD Code Status: Full code Family Communication: Disposition Plan:  Status is: Inpatient Remains inpatient appropriate  because: management of pain     Consultants:  GI Nephrology   Procedures:  HD   Antimicrobials:    Subjective She is complaining of chest pain abdominal pain, she usually have this type of pain with flare of her carcinoid cancer pain.  Usually require a few days of IV Dilaudid  Objective: Vitals:   04/26/23 1556 04/26/23 1929 04/27/23 0427 04/27/23 0744  BP: 90/69 107/74 (!) 86/54 (!) 93/51  Pulse: 100 (!) 58 98 93  Resp: 18 18 18 18   Temp: 97.8 F (36.6 C) 98.7 F (37.1 C) 97.7 F (36.5 C) (!) 97.4 F (36.3 C)  TempSrc: Oral Oral Oral   SpO2: 100% 91% 100% 100%  Weight:        Intake/Output Summary (Last 24 hours) at 04/27/2023 0757 Last data filed at 04/26/2023 1230 Gross per 24 hour  Intake --  Output 4000 ml  Net -4000 ml   Filed Weights   04/25/23 2027 04/26/23 0839 04/26/23 1230  Weight: 106.5 kg 106.9 kg 102.9 kg    Examination:  General exam: Appears calm and comfortable  Respiratory system: Clear to auscultation. Respiratory effort normal. Cardiovascular system: S1 & S2 heard, RRR. No JVD, murmurs, rubs, gallops or clicks. No pedal edema. Gastrointestinal system: Abdomen is nondistended, mild tenderness Central nervous system: Alert and oriented.  Extremities:   Data Reviewed: I have  personally reviewed following labs and imaging studies  CBC: Recent Labs  Lab 04/25/23 1409 04/25/23 1624 04/26/23 0619  WBC 12.6*  --  12.9*  HGB 8.2* 9.2* 8.2*  HCT 27.2* 27.0* 25.9*  MCV 95.4  --  94.5  PLT 288  --  273   Basic Metabolic Panel: Recent Labs  Lab 04/25/23 1409 04/25/23 1624 04/26/23 0618 04/26/23 0619  NA 135 136 136 135  K 2.9* 2.8* 3.1* 3.0*  CL 105 107 104 105  CO2 17*  --  17* 17*  GLUCOSE 131* 115* 81 90  BUN 46* 41* 50* 48*  CREATININE 8.66* 9.50* 8.66* 8.88*  CALCIUM 8.2*  --  8.4* 8.5*  PHOS  --   --  10.7*  --    GFR: Estimated Creatinine Clearance: 8.3 mL/min (A) (by C-G formula based on SCr of 8.88 mg/dL  (H)). Liver Function Tests: Recent Labs  Lab 04/25/23 1409 04/26/23 0618 04/26/23 0619  AST 9*  --  9*  ALT 8  --  7  ALKPHOS 80  --  78  BILITOT 0.4  --  0.5  PROT 6.5  --  6.1*  ALBUMIN 2.3* 2.2* 2.1*   Recent Labs  Lab 04/25/23 1409  LIPASE 24   No results for input(s): "AMMONIA" in the last 168 hours. Coagulation Profile: No results for input(s): "INR", "PROTIME" in the last 168 hours. Cardiac Enzymes: No results for input(s): "CKTOTAL", "CKMB", "CKMBINDEX", "TROPONINI" in the last 168 hours. BNP (last 3 results) No results for input(s): "PROBNP" in the last 8760 hours. HbA1C: No results for input(s): "HGBA1C" in the last 72 hours. CBG: Recent Labs  Lab 04/25/23 2025 04/26/23 0706 04/27/23 0621  GLUCAP 90 89 88   Lipid Profile: No results for input(s): "CHOL", "HDL", "LDLCALC", "TRIG", "CHOLHDL", "LDLDIRECT" in the last 72 hours. Thyroid Function Tests: No results for input(s): "TSH", "T4TOTAL", "FREET4", "T3FREE", "THYROIDAB" in the last 72 hours. Anemia Panel: No results for input(s): "VITAMINB12", "FOLATE", "FERRITIN", "TIBC", "IRON", "RETICCTPCT" in the last 72 hours. Sepsis Labs: No results for input(s): "PROCALCITON", "LATICACIDVEN" in the last 168 hours.  No results found for this or any previous visit (from the past 240 hours).       Radiology Studies: CT CHEST ABDOMEN PELVIS WO CONTRAST Result Date: 04/25/2023 CLINICAL DATA:  Sepsis, chest pain, abdominal pain EXAM: CT CHEST, ABDOMEN AND PELVIS WITHOUT CONTRAST TECHNIQUE: Multidetector CT imaging of the chest, abdomen and pelvis was performed following the standard protocol without IV contrast. RADIATION DOSE REDUCTION: This exam was performed according to the departmental dose-optimization program which includes automated exposure control, adjustment of the mA and/or kV according to patient size and/or use of iterative reconstruction technique. COMPARISON:  03/22/2023 FINDINGS: CT CHEST FINDINGS  Cardiovascular: Right side dialysis catheter again noted, unchanged. Heart is normal size. Scattered coronary artery calcifications. Aorta normal caliber. Mediastinum/Nodes: No mediastinal, hilar, or axillary adenopathy. Trachea and esophagus are unremarkable. Thyroid unremarkable. Lungs/Pleura: Scattered ground-glass opacities, likely areas of atelectasis. No confluent airspace opacities or effusions. Musculoskeletal: Chest wall soft tissues are unremarkable. No acute bony abnormality. CT ABDOMEN PELVIS FINDINGS Hepatobiliary: No focal liver abnormality is seen. Status post cholecystectomy. No biliary dilatation. Pancreas: No focal abnormality or ductal dilatation. Spleen: No focal abnormality.  Normal size. Adrenals/Urinary Tract: No adrenal abnormality. No focal renal abnormality. No stones or hydronephrosis. Urinary bladder is unremarkable. Stomach/Bowel: Stomach, large and small bowel grossly unremarkable. Vascular/Lymphatic: No evidence of aneurysm or adenopathy. Reproductive: Stable uterine fibroid.  No acute findings.  Other: No free fluid or free air. Musculoskeletal: No acute bony abnormality. IMPRESSION: No acute findings in the chest, abdomen or pelvis. Scattered aortic atherosclerosis crash that scattered coronary artery disease. Scattered ground-glass opacities in the lungs, favor areas of atelectasis. Fibroid uterus. Electronically Signed   By: Charlett Nose M.D.   On: 04/25/2023 18:11   DG Chest 2 View Result Date: 04/25/2023 CLINICAL DATA:  Chest pain. EXAM: CHEST - 2 VIEW COMPARISON:  03/22/2023. FINDINGS: Bilateral lung fields are clear. No pulmonary edema. Bilateral costophrenic angles are clear. Normal cardio-mediastinal silhouette. No acute osseous abnormalities. The soft tissues are within normal limits. Right IJ hemodialysis catheter noted with its tip overlying the cavoatrial junction region, unchanged. IMPRESSION: No active cardiopulmonary disease. Electronically Signed   By: Jules Schick M.D.   On: 04/25/2023 15:57        Scheduled Meds:  (feeding supplement) PROSource Plus  30 mL Oral BID BM   atorvastatin  10 mg Oral QPM   busPIRone  5 mg Oral TID   famotidine  20 mg Oral QAC breakfast   ferric citrate  210 mg Oral TID WC   gabapentin  100 mg Oral TID   Gerhardt's butt cream   Topical BID   leptospermum manuka honey  1 Application Topical Daily   magic mouthwash w/lidocaine  5 mL Oral QID   metoprolol tartrate  25 mg Oral BID   midodrine  5 mg Oral TID WC   mirtazapine  15 mg Oral QHS   pantoprazole  40 mg Oral BID   sodium bicarbonate  650 mg Oral BID   sodium chloride flush  3 mL Intravenous Q12H   sodium chloride flush  3 mL Intravenous Q12H   torsemide  100 mg Oral Once per day on Sunday Monday Wednesday Friday   Continuous Infusions:  heparin sodium (porcine)       LOS: 2 days    Time spent: 35 minutes    Makoto Sellitto A Kaimana Neuzil, MD [facility]   If 7PM-7AM, please contact night-coverage [email or web address]  04/27/2023, 7:57 AM

## 2023-04-27 NOTE — TOC Initial Note (Signed)
Transition of Care [facility]) - Initial/Assessment Note    Patient Details  Name: Deborah [name] MRN: [id number] Date of Birth: [birth year]  [facility]) CM/SW Contact:    Lorri Frederick, LCSW Phone Number: 04/27/2023, 4:00 PM  Clinical Narrative:    CSW met with pt for initial assessment.  Pt from home alone, reports she uses wheelchair, has [facility] aide daily for 3 hours and was also receiving assistance from her mother and family.  Several weeks ago her [facility] aide quit and she has not been replaced.  Family help has also decreased, pt reports she has had to leave her front door unlocked because she has had to call 911 for help.  Pt very fearful that it is unsafe in her neighborhood to leave door unlocked.  Discussed PT recommendation for SNF and pt is agreeable to this.  Pt is also agreeable to pursue LTC after STR.  Pt does want to return home in the future but unsure when she can put additional help in place.  Pt does have medicaid and is disabled.  Also HD TTS at [facility].  Permission given to speak with mother Tobi Bastos, permission given to send referral out in hub.  Pt quite tearful at times, asked CSW if she can talk to psychiatrist.  Reports she has had suicidal thoughts recently due to her situation.  MD informed.    Referral sent out in hub for SNF.               Expected Discharge Plan: Skilled Nursing Facility Barriers to Discharge: Continued Medical Work up, SNF Pending bed offer   Patient Goals and CMS Choice Patient states their goals for this hospitalization and ongoing recovery are:: get someplace safe          Expected Discharge Plan and Services In-house Referral: Clinical Social Work   Post Acute Care Choice: Skilled Nursing Facility Living arrangements for the past 2 months: Single Family Home                                      Prior Living Arrangements/Services Living arrangements for the past 2 months: Single Family Home Lives with::  Self Patient language and need for interpreter reviewed:: Yes Do you feel safe going back to the place where you live?: No   pt does not have adequate help at home currently  Need for Family Participation in Patient Care: Yes (Comment) Care giver support system in place?: Yes (comment) Current home services: Homehealth aide (daily, 3 hrs per day) Criminal Activity/Legal Involvement Pertinent to Current Situation/Hospitalization: No - Comment as needed  Activities of Daily Living   ADL Screening (condition at time of admission) Independently performs ADLs?: No Does the patient have a NEW difficulty with bathing/dressing/toileting/self-feeding that is expected to last >3 days?: No Does the patient have a NEW difficulty with getting in/out of bed, walking, or climbing stairs that is expected to last >3 days?: No Does the patient have a NEW difficulty with communication that is expected to last >3 days?: No Is the patient deaf or have difficulty hearing?: No Does the patient have difficulty seeing, even when wearing glasses/contacts?: No Does the patient have difficulty concentrating, remembering, or making decisions?: No  Permission Sought/Granted Permission sought to share information with : Family Supports Permission granted to share information with : Yes, Verbal Permission Granted  Share Information with NAME: mother  Tobi Bastos  Permission granted to share info w AGENCY: SNF        Emotional Assessment Appearance:: Appears older than stated age Attitude/Demeanor/Rapport: Engaged Affect (typically observed): Appropriate, Overwhelmed, Tearful/Crying Orientation: : Oriented to Self, Oriented to Place, Oriented to  Time, Oriented to Situation      Admission diagnosis:  Pain [R52] Volume overload [E87.70] Patient Active Problem List   Diagnosis Date Noted   Volume overload 04/25/2023   Chronic diastolic CHF (congestive heart failure) (HCC) 04/25/2023   Carcinoid tumor of abdomen  04/25/2023   PVD (peripheral vascular disease) (HCC) 04/25/2023   History of GI bleed 04/25/2023   History of arteriovenous malformation (AVM) 04/25/2023   Generalized anxiety disorder 04/25/2023   History of reactive airway disease 04/25/2023   History of spinal stenosis 04/25/2023   Chronic diabetic ulcer of left foot determined by examination (HCC) 04/25/2023   Diarrhea 03/01/2023   Intractable diarrhea 02/28/2023   Sinus tachycardia 02/17/2023   Cellulitis of heel, left 02/17/2023   Hx of BKA, right (HCC) 01/31/2023   Tardive dyskinesia 01/06/2023   Peutz-Jeghers polyps of small bowel (HCC) 12/24/2022   Gastric AVM 12/23/2022   Anemia of chronic disease 12/14/2022   Pressure injury of skin 11/30/2022   Non-pressure chronic ulcer of right heel and midfoot limited to breakdown of skin (HCC) 11/24/2022   ESRD on dialysis (HCC) 09/09/2022   Peripheral neuropathy 09/09/2022   (HFpEF) heart failure with preserved ejection fraction (HCC) 09/09/2022   CHF (congestive heart failure) (HCC) 07/13/2022   Hyponatremia 05/29/2022   Leukocytosis 05/11/2022   Edema of left upper extremity 03/21/2022   Acute pyelonephritis    Paroxysmal atrial fibrillation (HCC)    Chronic pain syndrome 09/04/2019   Malignant carcinoid tumor of duodenum (HCC)    NASH (nonalcoholic steatohepatitis) 06/05/2019   Chronic diarrhea    Restrictive lung disease secondary to obesity    History of gastric ulcer    Fibroid uterus 02/23/2019   Abdominal pain 07/17/2018   Anxiety 11/29/2017   Spinal stenosis, lumbar region with neurogenic claudication 08/03/2017   GERD (gastroesophageal reflux disease) 03/19/2017   Chronic depression 03/19/2017   Morbid obesity (HCC)    Normocytic anemia 08/16/2016   Gastroparesis 08/16/2016   Chronic gout 06/05/2016   Chronic hypokalemia 09/26/2015   Hypomagnesemia and hypokalemia 09/26/2015   Insulin dependent type 2 diabetes mellitus (HCC) 05/25/2015   Essential hypertension  09/28/2013   PCP:  Oneita Hurt, No Pharmacy:   [facility] - [place], [place] - [address] [place] [address] Phone: [phone] Fax: [phone]  [facility] [address] [place] [address] Phone: [phone] Fax: [phone]  [id number] [place], [place] - [address] AT [facility] [address] [place] [address] Phone: [phone] Fax: [phone]  [id number] - [address] ([place]), [place] - [address] [place] [address] Phone: [phone] Fax: [phone]     Social Drivers of Health (SDOH) Social History: SDOH Screenings   Food Insecurity: No Food Insecurity (04/25/2023)  Recent Concern: Food Insecurity - High Risk (01/25/2023)   Received from [facility]  Housing: High Risk (04/25/2023)  Transportation Needs: Unmet Transportation Needs (04/25/2023)  Utilities: Not At Risk (04/25/2023)  Recent Concern: Utilities - Medium Risk (01/25/2023)   Received from [facility]  Depression (PHQ2-9): Medium Risk (03/16/2023)  Financial Resource Strain: Low Risk (03/23/2021)   [facility] from  [facility]  Physical Activity: Not on File (10/31/2017)   Received from [place], [place]  Social Connections: Unknown (07/19/2021)   [facility] from [facility]  Stress: Low Risk (03/23/2021)   [facility])  Tobacco Use: Low Risk  (04/25/2023)   SDOH Interventions:     Readmission Risk Interventions    03/07/2023    4:19 PM 11/25/2022    5:16 PM 07/05/2022    1:20 PM  Readmission Risk Prevention Plan  Transportation Screening Complete Complete Complete  Medication Review (RN Care Manager) Complete Complete Referral to Pharmacy  PCP or Specialist appointment within 3-5 days of discharge Complete Complete Complete   HRI or Home Care Consult Complete Complete Complete  SW Recovery Care/Counseling Consult Complete Complete Complete  Palliative Care Screening Complete Not Applicable Not Applicable  Skilled Nursing Facility Not Applicable Not Applicable Not Applicable

## 2023-04-27 NOTE — Consult Note (Signed)
Referring Provider: Dr. Hartley Barefoot Primary Care Physician:  Pcp, No Primary Gastroenterologist: Gentry Fitz  Reason for Consultation: Nausea, vomiting, dysphagia and chronic abdominal pain   HPI: Deborah [name] is a 58 y.o. female with a past medical history of fibromyalgia, gout, hypertension, CHF, atrial fibrillation not on anticoagulation, CVA, diabetes mellitus type 2, ESRD on HD, PAD s/p right BKA, B12 deficiency, NAFLD with possible cirrhosis, chronic anemia, GERD, duodenal carcinoid tumor and colon polyps.   She presented to the ED 04/25/2023 via EMS with chest pain, shortness of breath, abdominal pain with lower extremity swelling.  She missed dialysis x 2 weeks ago because her friend who drives her was out of town.  Labs in the ED showed a WBC count of 12.6.  Hemoglobin 8.2 with a hemoglobin level 8.58-month ago.  Hematocrit 27.2.  Platelet 288.  Potassium 2.9.  BUN 46 up from 17.  Creatinine 8.66 up from 5.21.  Albumin 2.3.  Total bili 0.4.  Alk phos 80.  AST 9.  ALT 8.  Lipase 24.  Troponin 23 and 16.  Chest x-ray was negative.  Chest/abdominal/pelvic CT without contrast without evidence of acute intra-abdominal/pelvic pathology to explain her symptoms.  No evidence of PE.  Scattered groundglass opacities in the lungs suggestive of atelectasis and scattered aortic atherosclerosis with scattered CAD noted.  She was clinically fluid overloaded and neurology was consulted.  She was dialyzed 04/26/2023.  GI consult was requested for further evaluation regarding nausea, vomiting, dysphagia and chronic abdominal pain.  He is a challenging historian. She has a history of GERD for which she takes Protonix 40 mg once daily.  She has intermittent heartburn which is chronic. She developed new onset dysphagia 1 to 2 weeks ago, she describes having food, liquid and pills which gets stuck in her throat/upper esophagus which results in coughing or gagging and the stuck matter comes back out.  No  recent steroid use.  She took an antibiotic for 2 weeks within the past month for a chronic left foot wound. No bloody or black stools. She stated having diarrhea 2 to 3 times daily for the past 2 weeks.  She endorses having generalized abdominal pain for the past few weeks.  She was previously admitted to the hospital 12/2022 with acute on chronic abdominal pain and diarrhea.  Hemoglobin 5.8.  CTAP without contrast was unrevealing.  She underwent an EGD during this hospitalization 12/23/2022 which identified grade A esophagitis, 3 bleeding gastric AVMs treated with APC and gastritis.  She underwent a prior EGD due to complaints of coffee-ground emesis this was done per Dr. Dulce Sellar, had grade a esophagitis, very little view of the stomach due to retained food. She had been known previously to Dr. 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 Patient was unhappy with Dr. Rich Brave care and says she sought a second opinion in [place] and had seen a gastroenterologist up there who did an endoscopic resection.   Her most recent colonoscopy was 07/30/2019 which notified 3 polyps measuring 2 to 10 mm removed from the colon.  GI PROCEDURES:   EGD 12/23/2022 as an inpatient by Dr. Barron Alvine:  - Normal upper third of esophagus and middle third of esophagus.  - LA Grade A esophagitis with no bleeding.  - Three bleeding angioectasias in the stomach. Treated with argon plasma coagulation (APC).  - Gastritis. Treated with argon plasma  coagulation (APC).  - Normal examined duodenum.  - Successful completion of the Video Capsule Enteroscope placement.  - No specimens collected.  - Colonoscopy not performed today due to solid stools.   GIVENS SMALL BOWL CAPSULE ENDOSCOPY 12/23/2022:   EGD 07/26/2020:  - LA Grade A esophagitis with no bleeding. Suspected cause of black emesis.  - A large amount of  food (residue) in the stomach.  - Retained food in the duodenum.   A. DUODENUM, POLYPECTOMY:  - Low grade neuroendocrine tumor (carcinoid tumor), broadly present at  multiple tissue edges.  - Lymphovascular invasion is identified.  - See comment.    EGD/EUS at [facility] in August 2021 where they did biopsies, but they terminated the procedure due to difficulty breathing.  They had her return a week later on 11/06/2019 for repeat procedure with general anesthesia.  Records are in Care Everywhere.     EGD 08/02/2019:  - Z-line regular, 36 cm from the incisors.  - Normal mucosa was found in the entire stomach.  - Non-bleeding duodenal ulcer with no stigmata of bleeding.  - A single duodenal polyp. Resected and retrieved. Clip was placed.  - Normal second portion of the duodenum.   Colonoscopy 07/30/2019:  - Preparation of the colon was fair.  - The examined portion of the ileum was normal.  - Two 2 to 3 mm polyps in the descending colon. Biopsied.  - One 10 mm polyp in the proximal sigmoid colon, removed with a hot snare. Resected and retrieved.  - Internal hemorrhoids   Path report consistent with tubular adenomas                              Past Medical History:  Diagnosis Date   Acute back pain with sciatica, left    Acute back pain with sciatica, right    Acute encephalopathy 05/29/2022   Acute osteomyelitis of right calcaneus (HCC) 12/21/2022   Anemia, unspecified    Atrial fibrillation with RVR (HCC)-resolved 09/09/2022   Atypical chest pain 09/10/2021   Cancer (HCC)    Carcinoid tumor of duodenum    Chest pain with normal coronary angiography 2019   Chronic a-fib (HCC) 09/09/2022   Chronic pain    Chronic systolic CHF (congestive heart failure) (HCC)    Dehiscence of amputation stump of right lower extremity (HCC) 01/29/2023   Diabetes mellitus    DKA (diabetic ketoacidosis) (HCC)    Drug-seeking behavior    21 hospitalizations and 14 CT a/p in 2 years  for N/V and abdominal pain, demanding only IV dilaudid   Elevated troponin    chronic   Esophageal reflux    Fibromyalgia    Gastric ulcer    Gastroparesis    Gout    HCAP (healthcare-associated pneumonia) 06/19/2022   Hyperlipidemia    Hyperosmolar hyperglycemic state (HHS) (HCC) 05/11/2022   Hyperosmolar non-ketotic state due to type 2 diabetes mellitus (HCC) 05/11/2022   Hypertension    Hypomagnesemia    Lumbosacral stenosis    LVH (left ventricular hypertrophy)    Morbid obesity (HCC)    Nausea & vomiting 09/09/2022   NICM (nonischemic cardiomyopathy) (HCC)    PAF (paroxysmal atrial fibrillation) (HCC)    Sepsis (HCC) 11/23/2022   Stroke (HCC) 02/2011   Symptomatic anemia 12/14/2022   Thrombocytosis    Vitamin B12 deficiency anemia     Past Surgical History:  Procedure Laterality Date   ABDOMINAL AORTOGRAM  W/LOWER EXTREMITY N/A 11/29/2022   Procedure: ABDOMINAL AORTOGRAM W/LOWER EXTREMITY;  Surgeon: Daria Pastures, MD;  Location: [facility] INVASIVE CV LAB;  Service: Cardiovascular;  Laterality: N/A;   AMPUTATION Right 12/29/2022   Procedure: RIGHT BELOW KNEE AMPUTATION;  Surgeon: Nadara Mustard, MD;  Location: [facility] OR;  Service: Orthopedics;  Laterality: Right;   AV FISTULA PLACEMENT Left 06/30/2022   Procedure: LEFT BRACHIOCEPHALIC ARTERIOVENOUS (AV) FISTULA CREATION;  Surgeon: Cephus Shelling, MD;  Location: [facility] OR;  Service: Vascular;  Laterality: Left;   BIOPSY  07/27/2019   Procedure: BIOPSY;  Surgeon: Vida Rigger, MD;  Location: [facility] ENDOSCOPY;  Service: Endoscopy;;   BIOPSY  07/30/2019   Procedure: BIOPSY;  Surgeon: Kathi Der, MD;  Location: [facility] ENDOSCOPY;  Service: Gastroenterology;;   CATARACT EXTRACTION  01/2014   CHOLECYSTECTOMY     COLONOSCOPY WITH PROPOFOL N/A 07/30/2019   Procedure: COLONOSCOPY WITH PROPOFOL;  Surgeon: Kathi Der, MD;  Location: [facility] ENDOSCOPY;  Service: Gastroenterology;  Laterality: N/A;   ESOPHAGOGASTRODUODENOSCOPY N/A 07/27/2019    Procedure: ESOPHAGOGASTRODUODENOSCOPY (EGD);  Surgeon: Vida Rigger, MD;  Location: [facility] ENDOSCOPY;  Service: Endoscopy;  Laterality: N/A;   ESOPHAGOGASTRODUODENOSCOPY N/A 07/26/2020   Procedure: ESOPHAGOGASTRODUODENOSCOPY (EGD);  Surgeon: Willis Modena, MD;  Location: [facility] ENDOSCOPY;  Service: Endoscopy;  Laterality: N/A;   ESOPHAGOGASTRODUODENOSCOPY (EGD) WITH PROPOFOL N/A 08/02/2019   Procedure: ESOPHAGOGASTRODUODENOSCOPY (EGD) WITH PROPOFOL;  Surgeon: Kathi Der, MD;  Location: [facility] ENDOSCOPY;  Service: Gastroenterology;  Laterality: N/A;   ESOPHAGOGASTRODUODENOSCOPY (EGD) WITH PROPOFOL N/A 12/23/2022   Procedure: ESOPHAGOGASTRODUODENOSCOPY (EGD) WITH PROPOFOL;  Surgeon: Shellia Cleverly, DO;  Location: [facility] ENDOSCOPY;  Service: Gastroenterology;  Laterality: N/A;   FISTULA SUPERFICIALIZATION Left 12/31/2022   Procedure: LEFT ARM FISTULA TRANSPOSITION;  Surgeon: Maeola Harman, MD;  Location[facility] OR;  Service: Vascular;  Laterality: Left;   GIVENS CAPSULE STUDY N/A 12/23/2022   Procedure: GIVENS CAPSULE STUDY;  Surgeon: Shellia Cleverly, DO;  Location: [facility] ENDOSCOPY;  Service: Gastroenterology;  Laterality: N/A;   HEMOSTASIS CLIP PLACEMENT  08/02/2019   Procedure: HEMOSTASIS CLIP PLACEMENT;  Surgeon: Kathi Der, MD;  Location: [facility] ENDOSCOPY;  Service: Gastroenterology;;   HOT HEMOSTASIS N/A 12/23/2022   Procedure: HOT HEMOSTASIS (ARGON PLASMA COAGULATION/BICAP);  Surgeon: Shellia Cleverly, DO;  Location: [facility] ENDOSCOPY;  Service: Gastroenterology;  Laterality: N/A;   IR FLUORO GUIDE CV LINE RIGHT  06/24/2022   IR US GUIDE VASC ACCESS RIGHT  06/24/2022   POLYPECTOMY  07/30/2019   Procedure: POLYPECTOMY;  Surgeon: Kathi Der, MD;  Location: [facility] ENDOSCOPY;  Service: Gastroenterology;;   POLYPECTOMY  08/02/2019   Procedure: POLYPECTOMY;  Surgeon: Kathi Der, MD;  Location: [facility] ENDOSCOPY;  Service: Gastroenterology;;   STUMP REVISION Right 02/02/2023   Procedure: REVISION  RIGHT BELOW KNEE AMPUTATION;  Surgeon: Nadara Mustard, MD;  Location: [facility] OR;  Service: Orthopedics;  Laterality: Right;    Prior to Admission medications   Medication Sig Start Date End Date Taking? Authorizing Provider  albuterol (PROVENTIL) (2.5 MG/3ML) 0.083% nebulizer solution Take 3 mLs (2.5 mg total) by nebulization every 6 (six) hours as needed for wheezing or shortness of breath. 04/06/19  Yes Bing Neighbors, NP  allopurinol (ZYLOPRIM) 100 MG tablet Take 1 tablet (100 mg total) by mouth 2 (two) times daily. 11/30/22  Yes Azucena Fallen, MD  ascorbic acid (VITAMIN C) 500 MG tablet Take 500 mg by mouth daily.   Yes [provider]  atorvastatin (LIPITOR) 10 MG tablet Take 10 mg by mouth every evening. 12/03/21  Yes [provider]  busPIRone (BUSPAR) 5 MG tablet Take 5 mg by mouth 3 (three) times daily.   Yes [provider]  carvedilol (COREG) 12.5 MG tablet Take 12.5 mg by mouth 2 (two) times daily with a meal.   Yes [provider]  cetirizine (ZYRTEC) 10 MG tablet Take 10 mg by mouth daily. 08/05/22  Yes [provider]  famotidine (PEPCID) 20 MG tablet Take 20 mg by mouth daily before breakfast.   Yes [provider]  ferric citrate (AURYXIA) 1 GM 210 MG(Fe) tablet Take 2 tablets (420 mg total) by mouth 3 (three) times daily with meals. Patient taking differently: Take 210 mg by mouth 3 (three) times daily with meals. 11/30/22  Yes Azucena Fallen, MD  fluticasone [facility]) 50 MCG/ACT nasal spray Place 2 sprays into both nostrils daily as needed for allergies or rhinitis. 12/19/18  Yes Rai, Ripudeep K, MD  folic acid (FOLVITE) 1 MG tablet Take 1 tablet (1 mg total) by mouth daily. 01/10/23  Yes Danford, Earl Lites, MD  hydrALAZINE (APRESOLINE) 50 MG tablet Take 50 mg by mouth 3 (three) times daily.   Yes [provider]  hydrOXYzine (ATARAX) 25 MG tablet Take 1 tablet (25 mg total) by mouth 3 (three) times daily  as needed for anxiety. Patient taking differently: Take 25 mg by mouth See admin instructions. Give 25 mg (1 tablet) every 6 hours as needed for anxiety, agitation for 14 days (starting 11/19), then every 8 hours as needed. 01/10/23  Yes Danford, Earl Lites, MD  insulin lispro (HUMALOG) 100 UNIT/ML KwikPen Before each meal 3 times a day, 140-199 - 2 units, 200-250 - 6 units, 251-299 - 8 units,  300-349 - 12 units,  350 or above 14 units. Patient taking differently: Inject 0-14 Units into the skin See admin instructions. Inject 0-14 units per sliding scale before meals: < 70 notify MD 70-139 : 0 units 140-199 : 2 units 200-250 : 6 units 251-299 : 8 units 300-349 : 12 units 350-400 : 14 units > 401 notify MD. 06/04/22  Yes Leroy Sea, MD  isosorbide mononitrate (IMDUR) 60 MG 24 hr tablet Take 60 mg by mouth daily.   Yes [provider]  Lactobacillus (ACIDOPHILUS) CAPS capsule Take 1 capsule by mouth 3 (three) times daily with meals. 03/08/23  Yes Elgergawy, Leana Roe, MD  leptospermum manuka honey (MEDIHONEY) PSTE paste Apply 1 Application topically daily. 03/08/23  Yes Elgergawy, Leana Roe, MD  loperamide (IMODIUM) 2 MG capsule Take 1 capsule (2 mg total) by mouth as needed for diarrhea or loose stools. 03/08/23  Yes Elgergawy, Leana Roe, MD  methocarbamol (ROBAXIN) 500 MG tablet Take 1 tablet (500 mg total) by mouth every 6 (six) hours as needed for muscle spasms. 01/10/23  Yes Danford, Earl Lites, MD  metoprolol tartrate (LOPRESSOR) 25 MG tablet Take 1 tablet (25 mg total) by mouth 2 (two) times daily. 03/08/23  Yes Elgergawy, Leana Roe, MD  midodrine (PROAMATINE) 5 MG tablet Take 5 mg by mouth 3 (three) times daily. 02/18/23  Yes [provider]  mirtazapine (REMERON) 15 MG tablet Take 15 mg by mouth at bedtime. 10/18/22  Yes [provider]  Multiple Vitamins-Minerals (SENIOR MULTIVITAMIN PLUS PO) Take 1 tablet by mouth daily.   Yes [provider]   nutrition supplement, JUVEN, (JUVEN) PACK Take 1 packet by mouth 2 (two) times daily between meals. 01/10/23  Yes Danford, Earl Lites, MD  Nutritional Supplements (,FEEDING SUPPLEMENT, PROSOURCE  PLUS) liquid Take 30 mLs by mouth 2 (two) times daily between meals. 01/10/23  Yes Danford, Earl Lites, MD  pantoprazole (PROTONIX) 40 MG tablet Take 1 tablet (40 mg total) by mouth 2 (two) times daily. 02/18/23  Yes Raulkar, Drema Pry, MD  spironolactone (ALDACTONE) 25 MG tablet Take 12.5 mg by mouth daily.   Yes [provider]  HYDROmorphone (DILAUDID) 2 MG tablet Wean Prescription. Dilaudid  2 mg one tablet 4 times a day as needed for pain  for 5 days. Dilaudid 2 mg one tablet 3 times a day as needed for pain for 5 days. Dilaudid 2 mg one tablet as needed for pain twice a day for 5 days. Dilaudid 2 mg one tablet daily for 5 days as needed for pain. Dilaudid 2 mg every other day for three days then discontinued. Patient not taking: Reported on 04/26/2023 03/29/23   Jones Bales, NP  hydrOXYzine (VISTARIL) 25 MG capsule Take 25 mg by mouth 3 (three) times daily as needed. Patient not taking: Reported on 04/26/2023 04/04/23   [provider]    Current Facility-Administered Medications  Medication Dose Route Frequency Provider Last Rate Last Admin   (feeding supplement) PROSource Plus liquid 30 mL  30 mL Oral BID BM Sundil, Subrina, MD       atorvastatin (LIPITOR) tablet 10 mg  10 mg Oral QPM Sundil, Subrina, MD       busPIRone (BUSPAR) tablet 5 mg  5 mg Oral TID Janalyn Shy, Subrina, MD   5 mg at 04/25/23 2035   famotidine (PEPCID) IVPB 20 mg premix  20 mg Intravenous QAC breakfast [name], Belkys A, MD       ferric citrate (AURYXIA) tablet 210 mg  210 mg Oral TID WC Sundil, Subrina, MD       gabapentin (NEURONTIN) capsule 100 mg  100 mg Oral TID Janalyn Shy, Subrina, MD       Gerhardt's butt cream   Topical BID Almon Hercules, MD   Given at 04/27/23 1204   heparin sodium (porcine)  injection 3,800 Units  3,800 Units Intracatheter Continuous PRN Delano Metz, MD   3,800 Units at 04/26/23 1151   HYDROmorphone (DILAUDID) injection 0.5 mg  0.5 mg Intravenous Q4H PRN [name], Belkys A, MD       HYDROmorphone (DILAUDID) tablet 2 mg  2 mg Oral Q6H PRN Candelaria Stagers T, MD   2 mg at 04/26/23 1854   hydrOXYzine (ATARAX) tablet 25 mg  25 mg Oral TID PRN Janalyn Shy, Subrina, MD   25 mg at 04/26/23 1125   leptospermum manuka honey (MEDIHONEY) paste 1 Application  1 Application Topical Daily Almon Hercules, MD   1 Application at 04/27/23 1212   levalbuterol (XOPENEX) nebulizer solution 0.63 mg  0.63 mg Nebulization Q6H PRN Sundil, Subrina, MD       liver oil-zinc oxide (DESITIN) 40 % ointment   Topical Q8H PRN Janalyn Shy, Subrina, MD       loperamide (IMODIUM) capsule 2 mg  2 mg Oral PRN Janalyn Shy, Subrina, MD       magic mouthwash w/lidocaine  5 mL Oral QID Candelaria Stagers T, MD       methocarbamol (ROBAXIN) tablet 500 mg  500 mg Oral Q6H PRN Janalyn Shy, Subrina, MD   500 mg at 04/25/23 2034   metoprolol tartrate (LOPRESSOR) tablet 25 mg  25 mg Oral BID Sundil, Subrina, MD       midodrine (PROAMATINE) tablet 5 mg  5 mg Oral TID WC Sundil,  Subrina, MD   5 mg at 04/26/23 1854   mirtazapine (REMERON) tablet 15 mg  15 mg Oral QHS Sundil, Subrina, MD       ondansetron [facility]) tablet 4 mg  4 mg Oral Q6H PRN Janalyn Shy, Subrina, MD       Or   ondansetron [facility]) injection 4 mg  4 mg Intravenous Q6H PRN Janalyn Shy, Subrina, MD   4 mg at 04/27/23 1216   pantoprazole (PROTONIX) injection 40 mg  40 mg Intravenous Q12H [name], Belkys A, MD   40 mg at 04/27/23 1156   sodium bicarbonate tablet 650 mg  650 mg Oral BID Sundil, Subrina, MD       sodium chloride flush (NS) 0.9 % injection 3 mL  3 mL Intravenous Q12H Sundil, Subrina, MD   3 mL at 04/26/23 2152   sodium chloride flush (NS) 0.9 % injection 3 mL  3 mL Intravenous Q12H Sundil, Subrina, MD   3 mL at 04/27/23 1157   sodium chloride flush (NS) 0.9 % injection 3 mL  3  mL Intravenous PRN Janalyn Shy, Subrina, MD       torsemide [facility]) tablet 100 mg  100 mg Oral Once per day on Sunday Monday Wednesday Friday Pola Corn, NP        Allergies as of 04/25/2023 - Review Complete 04/25/2023  Allergen Reaction Noted   Isovue [iopamidol] Anaphylaxis, [name], and Other (See Comments) 11/28/2017   Nsaids Anaphylaxis and Other (See Comments) 09/21/2015   Penicillins Itching, Palpitations, and Other (See Comments) 02/04/2011   Reglan [metoclopramide] Other (See Comments) 08/08/2019   Valium [diazepam] [name] 09/21/2011   Zestril [lisinopril] Anaphylaxis and Swelling 09/21/2011   Tolectin [tolmetin] Nausea And Vomiting, Nausea Only, and Other (See Comments) 09/21/2015   Asa [aspirin] Other (See Comments) 09/26/2015   Aspartame and phenylalanine Hives 09/10/2022   Bentyl [dicyclomine] Other (See Comments) 04/15/2022   Hibiclens [chlorhexidine gluconate] Other (See Comments)    Flexeril [cyclobenzaprine] Palpitations 05/17/2020   Oxycontin [oxycodone] Palpitations 10/09/2020   Rifamycins Palpitations 07/25/2020   Tylenol [acetaminophen] Nausea And Vomiting, Nausea Only, and Other (See Comments) 08/28/2015   Ultram [tramadol] Nausea And Vomiting and Palpitations 12/22/2015    Family History  Problem Relation Age of Onset   Diabetes Mother    Diabetes Father    Heart disease Father    Diabetes Sister    Congestive Heart Failure Sister 91   Diabetes Brother     Social History   Socioeconomic History   Marital status: Married    Spouse name: Not on file   Number of children: Not on file   Years of education: Not on file   Highest education level: Not on file  Occupational History   Not on file  Tobacco Use   Smoking status: Never   Smokeless tobacco: Never  Vaping Use   Vaping status: Never Used  Substance and Sexual Activity   Alcohol use: No   Drug use: No   Sexual activity: Not Currently    Birth  control/protection: None  Other Topics Concern   Not on file  Social History Narrative   ** Merged History Encounter **       Social Drivers of Health   Financial Resource Strain: Low Risk (03/23/2021)   [facility])   Financial Resource Strain  Food Insecurity: No Food Insecurity (04/25/2023)   Hunger Vital Sign    Worried About Running Out of Food in the Last  Year: Never true    Ran Out of Food in the Last Year: Never true  Recent Concern: Food Insecurity - High Risk (01/25/2023)   Received from [facility]   Hunger Vital Sign    Worried About Running Out of Food in the Last Year: Patient declined to answer    Ran Out of Food in the Last Year: Often true  Transportation Needs: Unmet Transportation Needs (04/25/2023)   PRAPARE - Administrator, Civil Service (Medical): Yes    Lack of Transportation (Non-Medical): Yes  Physical Activity: Not on File (10/31/2017)   Received from [place], [place]   Physical Activity    Physical Activity: 0  Stress: Low Risk (03/23/2021)   [facility])   Stress    Over the last 2 weeks, how often have you been bothered by the following problems: feeling nervous, anxious, on edge?: Not at all    Over the last 2 weeks, how often have you been bothered by the following problems: Not being able to stop or control worrying?: Not at all  Social Connections: Unknown (07/19/2021)   Received from [facility]   Social Network    Social Network: Not on file  Intimate Partner Violence: Not At Risk (04/25/2023)   Humiliation, Afraid, Rape, and Kick questionnaire    Fear of Current or Ex-Partner: No    Emotionally Abused: No    Physically Abused: No    Sexually Abused: No    Review of Systems: Gen: Denies fever, sweats or chills. No weight loss.  CV: Denies chest pain, palpitations or edema. Resp: Denies cough, shortness of breath of  hemoptysis.  GI: See HPI. GU : Denies urinary burning, blood in urine, increased urinary frequency or incontinence. MS: + Generalized body aches.  Derm: Denies rash, itchiness, skin lesions or unhealing ulcers. Psych:+ Anxiety and depression. Heme: Denies easy bruising, bleeding. Neuro:  Denies headaches, dizziness or paresthesias. Endo: + DM type II.   Physical Exam: Vital signs in last 24 hours: Temp:  [97.4 F (36.3 C)-98.7 F (37.1 C)] 97.4 F (36.3 C) (02/19 0744) Pulse Rate:  [58-100] 93 (02/19 0744) Resp:  [18] 18 (02/19 0744) BP: (86-107)/(51-74) 93/51 (02/19 0744) SpO2:  [91 %-100 %] 100 % (02/19 0744)   General: Alert 58 year old female sitting up in the chair intermittently moaning and crying out loud due to having generalized bodyaches and abdominal pain. Head:  Normocephalic and atraumatic. Eyes:  No scleral icterus. Conjunctiva pink. Ears:  Normal auditory acuity. Nose:  No deformity, discharge or lesions. Mouth: Absent dentition.  No ulcers or lesions.  No thrush. Neck:  Supple. No lymphadenopathy or thyromegaly.  Chest: Right subclavian dialysis catheter intact. Lungs: Breath sounds clear throughout. No wheezes, rhonchi or crackles.  Heart: Regular rhythm, no murmurs. Abdomen: BS abdomen, soft, generalized tenderness without rebound or guarding.  Positive bowel sounds to all 4 quadrants.  No bruit.  No palpable mass in setting of obese abdomen. Rectal: Deferred. Musculoskeletal:  Right BKA. Pulses:  Normal pulses noted. Extremities: No edema. LUE fistula with + bruit and thrill.  Left foot Ace wrap dry and intact. Neurologic:  Alert and  oriented x 4.  Skin:  Intact without significant lesions or rashes. Psych:  Alert and cooperative. Normal mood and affect.  Intake/Output from previous day: 02/18 0701 - 02/19 0700 In: -  Out: 4000  Intake/Output this shift: No intake/output data recorded.  Lab Results: Recent Labs  04/25/23 1409 04/25/23 1624  04/26/23 0619  WBC 12.6*  --  12.9*  HGB 8.2* 9.2* 8.2*  HCT 27.2* 27.0* 25.9*  PLT 288  --  273   BMET Recent Labs    04/25/23 1409 04/25/23 1624 04/26/23 0618 04/26/23 0619  NA 135 136 136 135  K 2.9* 2.8* 3.1* 3.0*  CL 105 107 104 105  CO2 17*  --  17* 17*  GLUCOSE 131* 115* 81 90  BUN 46* 41* 50* 48*  CREATININE 8.66* 9.50* 8.66* 8.88*  CALCIUM 8.2*  --  8.4* 8.5*   LFT Recent Labs    04/25/23 1409 04/26/23 0618 04/26/23 0619  PROT 6.5  --  6.1*  ALBUMIN 2.3*   < > 2.1*  AST 9*  --  9*  ALT 8  --  7  ALKPHOS 80  --  78  BILITOT 0.4  --  0.5  BILIDIR <0.1  --   --   IBILI NOT CALCULATED  --   --    < > = values in this interval not displayed.   PT/INR No results for input(s): "LABPROT", "INR" in the last 72 hours. Hepatitis Panel Recent Labs    04/25/23 1739  HEPBSAG NON REACTIVE      Studies/Results: CT CHEST ABDOMEN PELVIS WO CONTRAST Result Date: 04/25/2023 CLINICAL DATA:  Sepsis, chest pain, abdominal pain EXAM: CT CHEST, ABDOMEN AND PELVIS WITHOUT CONTRAST TECHNIQUE: Multidetector CT imaging of the chest, abdomen and pelvis was performed following the standard protocol without IV contrast. RADIATION DOSE REDUCTION: This exam was performed according to the departmental dose-optimization program which includes automated exposure control, adjustment of the mA and/or kV according to patient size and/or use of iterative reconstruction technique. COMPARISON:  03/22/2023 FINDINGS: CT CHEST FINDINGS Cardiovascular: Right side dialysis catheter again noted, unchanged. Heart is normal size. Scattered coronary artery calcifications. Aorta normal caliber. Mediastinum/Nodes: No mediastinal, hilar, or axillary adenopathy. Trachea and esophagus are unremarkable. Thyroid unremarkable. Lungs/Pleura: Scattered ground-glass opacities, likely areas of atelectasis. No confluent airspace opacities or effusions. Musculoskeletal: Chest wall soft tissues are unremarkable. No acute  bony abnormality. CT ABDOMEN PELVIS FINDINGS Hepatobiliary: No focal liver abnormality is seen. Status post cholecystectomy. No biliary dilatation. Pancreas: No focal abnormality or ductal dilatation. Spleen: No focal abnormality.  Normal size. Adrenals/Urinary Tract: No adrenal abnormality. No focal renal abnormality. No stones or hydronephrosis. Urinary bladder is unremarkable. Stomach/Bowel: Stomach, large and small bowel grossly unremarkable. Vascular/Lymphatic: No evidence of aneurysm or adenopathy. Reproductive: Stable uterine fibroid.  No acute findings. Other: No free fluid or free air. Musculoskeletal: No acute bony abnormality. IMPRESSION: No acute findings in the chest, abdomen or pelvis. Scattered aortic atherosclerosis crash that scattered coronary artery disease. Scattered ground-glass opacities in the lungs, favor areas of atelectasis. Fibroid uterus. Electronically Signed   By: Charlett Nose M.D.   On: 04/25/2023 18:11   DG Chest 2 View Result Date: 04/25/2023 CLINICAL DATA:  Chest pain. EXAM: CHEST - 2 VIEW COMPARISON:  03/22/2023. FINDINGS: Bilateral lung fields are clear. No pulmonary edema. Bilateral costophrenic angles are clear. Normal cardio-mediastinal silhouette. No acute osseous abnormalities. The soft tissues are within normal limits. Right IJ hemodialysis catheter noted with its tip overlying the cavoatrial junction region, unchanged. IMPRESSION: No active cardiopulmonary disease. Electronically Signed   By: Jules Schick M.D.   On: 04/25/2023 15:57    IMPRESSION/PLAN:  57 year old female with ESRD on hemodialysis admitted to the hospital 04/25/2023 with chest pain, shortness of breath and lower extremity edema  after missing 2 weeks of dialysis.  Troponin 23 -> 16.  EKG without acute ischemia.  Received dialysis and her respiratory status stabilized and no further chest pain. -Management per the hospitalist and nephrology  History of GERD with new onset dysphagia with solid  foods, liquids and pills for the past 1 to 2 weeks.  No recent steroids.  Patient reported taking antibiotics for 2 weeks for a chronic left foot wound within the past month.  On Protonix 40 mg daily at home.  Her most recent EGD 12/2022 showed grade a esophagitis and 3 gastric AVMs. Patient stated she cannot swallow barium for barium swallow study because she has anaphylactic reaction to oral contrast. -Consider EGD to rule out reflux versus candidiasis esophagitis/GERD/esophageal stricture.  Await further recommendations per Dr. Leonides Schanz -Pantoprazole 40 mg twice daily -Ondansetron 4 mg p.o. or IV every 6 hours as needed  N/V, likely secondary to missing dialysis x 2 weeks. No hematemesis.  -See plan above  Acute on chronic generalized abdominal pain.  WBC 12.9.  CTAP without contrast was unrevealing.   -Defer pain management to the hospitalist -CBC in am  Chronic anemia secondary to ESRD +/- chronic GI blood loss from gastric AVMs.  No overt GI bleeding.  No longer on Eliquis.  Hemoglobin 9.2 -> 8.2. -CBC in a.m. -Transfuse for hemoglobin less than 7 or as needed if symptomatic  Diarrhea, x 2 weeks.  Patient endorsed taking antibiotic for 2 weeks within the past month for chronic left foot ulcer. -Check GI pathogen panel and C. difficile   Hypokalemia -Management per the hospitalist   History of duodenal carcinoid mass s/p resection  History of tubular adenomatous polyps per colonoscopy 07/2019 -Outpatient colonoscopy  DM type II  Questionable history of NAFLD/cirrhosis.  CTAP without contrast showed a normal liver and spleen.  Normal LFTs.  Platelets 273.  Albumin 2.1.   Deborah [name]  04/27/2023, 3:17 PM

## 2023-04-27 NOTE — Evaluation (Signed)
Occupational Therapy Evaluation Patient Details Name: Deborah [name] MRN: [id number] DOB: 1965/06/01 Today's Date: 04/27/2023   History of Present Illness   58 y/o female presents to [facility] on 2/17 with resports of chest pain, abdominal pain, and increased swelling, nausea, vomiting, and diahrrea. Admitted with volume overload secondary to missing dialysis. PMH includes: ESRD on dialysis, gastroparesis, GERD, anemia of chronic disease, paroxysmal atrial fibrillation, diastolic heart failure with preserved EF 55 to 60%, gout, duodenal carcinoid tumor, peripheral vascular disease status post right-sided AKA, history of gastric AVM, NASH, reactive dyskinesia, depression, anxiety, obesity, restrictive lung disease, spinal stenosis, chronic pain syndrome, insulin-dependent DM type II, chronic left heel diabetic ulcer, and history of chronic diarrhea in the setting of carcinoid tumor.     Clinical Impressions Pt gets assist for ADL and mobility on caregiver and mechanical lift - but was active participant. Today she is in extreme abdominal pain which waxed and waned throughout session. Overall she is mod A for UB ADL and max to total for LB ADL. Pt with generalized weakness in BUE and would benefit from skilled OT acutely and well as post-acute at rehab of <3 hours daily. Next session establish theraband HEP for BUE as well as demo/education on AE for ADL.      If plan is discharge home, recommend the following:   A lot of help with bathing/dressing/bathroom;Assistance with cooking/housework;Assist for transportation;Other (comment) (lift for mobility)     Functional Status Assessment   Patient has had a recent decline in their functional status and demonstrates the ability to make significant improvements in function in a reasonable and predictable amount of time.     Equipment Recommendations   None recommended by OT (Pt has appropriate DME)     Recommendations for Other Services    PT consult;Speech consult     Precautions/Restrictions   Precautions Precautions: Fall;Other (comment) Precaution/Restrictions Comments: R BKA (baseline) Restrictions Weight Bearing Restrictions Per Provider Order: No     Mobility Bed Mobility               General bed mobility comments: Pt in recliner at beginning and end of sessoin    Transfers                   General transfer comment: Pt lift transfer at baseline - in recliner at beginning and end of session      Balance                                           ADL either performed or assessed with clinical judgement   ADL Overall ADL's : Needs assistance/impaired Eating/Feeding: Modified independent Eating/Feeding Details (indicate cue type and reason): does not cook at baseline - dependent on others bringing her food, but can self-feed typically. currently she is having swallowing problems - but can bring fod to her mouth Grooming: Set up;Wash/dry face;Sitting Grooming Details (indicate cue type and reason): in recliner Upper Body Bathing: Moderate assistance   Lower Body Bathing: Maximal assistance   Upper Body Dressing : Minimal assistance   Lower Body Dressing: Maximal assistance   Toilet Transfer: Total assistance Toilet Transfer Details (indicate cue type and reason): lift at baseline Toileting- Clothing Manipulation and Hygiene: Maximal assistance;Bed level       Functional mobility during ADLs: Total assistance (lift at baseline) General ADL Comments: Pt needs education on AE  for ADL as well as HEP for strengthening UB for ADL and transfers/repositioning in the bed     Vision         Perception         Praxis         Pertinent Vitals/Pain Pain Assessment Pain Assessment: 0-10 Pain Score: 10-Worst pain ever Pain Location: stomach Pain Descriptors / Indicators: Moaning, Crying, Stabbing Pain Intervention(s): Monitored during session, Repositioned,  Patient requesting pain meds-RN notified, Heat applied     Extremity/Trunk Assessment Upper Extremity Assessment Upper Extremity Assessment: Generalized weakness (grossly 4/5)   Lower Extremity Assessment Lower Extremity Assessment: Defer to PT evaluation RLE Deficits / Details: prior BKA LLE Deficits / Details: Heel ulcer   Cervical / Trunk Assessment Cervical / Trunk Assessment: Normal   Communication Communication Communication: No apparent difficulties   Cognition Arousal: Alert Behavior During Therapy: WFL for tasks assessed/performed, Lability               OT - Cognition Comments: Pt all of a sudden screaming out of no where and crying. Stabbing pain in abdomen - however when distracted not showing outward signs of pain. Support and heat pads provided                 Following commands: Intact       Cueing  General Comments   Cueing Techniques: Verbal cues      Exercises     Shoulder Instructions      Home Living Family/patient expects to be discharged to:: Private residence Living Arrangements: Alone Available Help at Discharge: Other (Comment) (Ambulance service) Type of Home: Apartment Home Access: Stairs to enter Entrance Stairs-Number of Steps: 2 Entrance Stairs-Rails: Can reach both Home Layout: One level     Bathroom Shower/Tub: Industrial/product designer Accessibility: Yes   Home Equipment: Wheelchair - manual;BSC/3in1;Hospital bed;Lift chair;Other (comment) (hoyer lift)          Prior Functioning/Environment Prior Level of Function : Needs assist       Physical Assist : ADLs (physical);Mobility (physical)     Mobility Comments: Had a nurse assist with all transfers, ADLs -- however she quit 2-weeks ago ADLs Comments: Had a nurse assist with all transfers, ADLs -- however she quit 2-weeks ago    OT Problem List: Decreased strength;Decreased activity tolerance;Decreased knowledge of use of DME  or AE;Obesity   OT Treatment/Interventions: Self-care/ADL training;DME and/or AE instruction;Therapeutic exercise;Patient/family education      OT Goals(Current goals can be found in the care plan section)   Acute Rehab OT Goals Patient Stated Goal: decrease pain OT Goal Formulation: With patient/family Time For Goal Achievement: 05/11/23 Potential to Achieve Goals: Good ADL Goals Pt Will Perform Grooming: with modified independence;sitting Pt Will Perform Upper Body Bathing: with modified independence;sitting;with adaptive equipment;bed level Pt Will Perform Lower Body Bathing: with modified independence;with adaptive equipment;sitting/lateral leans;bed level Pt Will Perform Upper Body Dressing: with modified independence;sitting Pt Will Perform Lower Body Dressing: with modified independence;sitting/lateral leans;bed level;with adaptive equipment Pt/caregiver will Perform Home Exercise Program: Both right and left upper extremity;With theraband;With Supervision;With written HEP provided   OT Frequency:  Min 1X/week    Co-evaluation              AM-PAC OT "6 Clicks" Daily Activity     Outcome Measure Help from another person eating meals?: None Help from another person taking care of personal grooming?: A Little Help from another person toileting, which includes using  toliet, bedpan, or urinal?: A Lot Help from another person bathing (including washing, rinsing, drying)?: A Lot Help from another person to put on and taking off regular upper body clothing?: A Little Help from another person to put on and taking off regular lower body clothing?: A Lot 6 Click Score: 16   End of Session Nurse Communication: Need for lift equipment;Mobility status;Patient requests pain meds  Activity Tolerance: Patient limited by pain Patient left: in chair;with call bell/phone within reach;with family/visitor present;with nursing/sitter in room  OT Visit Diagnosis: Muscle weakness  (generalized) (M62.81)                Time: 1610-9604 OT Time Calculation (min): 21 min Charges:  OT General Charges $OT Visit: 1 Visit OT Evaluation $OT Eval Moderate Complexity: 1 Mod  Nyoka Cowden OTR/L Acute Rehabilitation Services Office: [phone]   Evern [facility] 04/27/2023, 2:03 PM

## 2023-04-27 NOTE — Progress Notes (Signed)
[facility] Progress Note   Subjective: Seen in room. Net UF 4 Liters HD 04/26/2023. Continue lowering volume as tolerated.    Objective Vitals:   04/26/23 1556 04/26/23 1929 04/27/23 0427 04/27/23 0744  BP: 90/69 107/74 (!) 86/54 (!) 93/51  Pulse: 100 (!) 58 98 93  Resp: 18 18 18 18   Temp: 97.8 F (36.6 C) 98.7 F (37.1 C) 97.7 F (36.5 C) (!) 97.4 F (36.3 C)  TempSrc: Oral Oral Oral   SpO2: 100% 91% 100% 100%  Weight:       Physical Exam General: Chronically ill appearing female in NAD Heart: S1,S2 No M/R/G SR Lungs: CTAB  Abdomen: Obese NABs Extremities: R BKA 2+ LLE edema Drsg L foot Dialysis Access: [facility] drsg intact  Additional Objective Labs: Basic Metabolic Panel: Recent Labs  Lab 04/25/23 1409 04/25/23 1624 04/26/23 0618 04/26/23 0619  NA 135 136 136 135  K 2.9* 2.8* 3.1* 3.0*  CL 105 107 104 105  CO2 17*  --  17* 17*  GLUCOSE 131* 115* 81 90  BUN 46* 41* 50* 48*  CREATININE 8.66* 9.50* 8.66* 8.88*  CALCIUM 8.2*  --  8.4* 8.5*  PHOS  --   --  10.7*  --    Liver Function Tests: Recent Labs  Lab 04/25/23 1409 04/26/23 0618 04/26/23 0619  AST 9*  --  9*  ALT 8  --  7  ALKPHOS 80  --  78  BILITOT 0.4  --  0.5  PROT 6.5  --  6.1*  ALBUMIN 2.3* 2.2* 2.1*   Recent Labs  Lab 04/25/23 1409  LIPASE 24   CBC: Recent Labs  Lab 04/25/23 1409 04/25/23 1624 04/26/23 0619  WBC 12.6*  --  12.9*  HGB 8.2* 9.2* 8.2*  HCT 27.2* 27.0* 25.9*  MCV 95.4  --  94.5  PLT 288  --  273   Blood Culture    Component Value Date/Time   SDES BLOOD BLOOD RIGHT HAND 02/17/2023 2243   SPECREQUEST  02/17/2023 2243    BOTTLES DRAWN AEROBIC AND ANAEROBIC Blood Culture results may not be optimal due to an inadequate volume of blood received in culture bottles   CULT  02/17/2023 2243    NO GROWTH 5 DAYS Performed at [facility], [address], [place], [place] [address]    REPTSTATUS 02/22/2023 FINAL 02/17/2023 2243    Cardiac  Enzymes: No results for input(s): "CKTOTAL", "CKMB", "CKMBINDEX", "TROPONINI" in the last 168 hours. CBG: Recent Labs  Lab 04/25/23 2025 04/26/23 0706 04/27/23 0621  GLUCAP 90 89 88   Iron Studies: No results for input(s): "IRON", "TIBC", "TRANSFERRIN", "FERRITIN" in the last 72 hours. @lablastinr3 @ Studies/Results: CT CHEST ABDOMEN PELVIS WO CONTRAST Result Date: 04/25/2023 CLINICAL DATA:  Sepsis, chest pain, abdominal pain EXAM: CT CHEST, ABDOMEN AND PELVIS WITHOUT CONTRAST TECHNIQUE: Multidetector CT imaging of the chest, abdomen and pelvis was performed following the standard protocol without IV contrast. RADIATION DOSE REDUCTION: This exam was performed according to the departmental dose-optimization program which includes automated exposure control, adjustment of the mA and/or kV according to patient size and/or use of iterative reconstruction technique. COMPARISON:  03/22/2023 FINDINGS: CT CHEST FINDINGS Cardiovascular: Right side dialysis catheter again noted, unchanged. Heart is normal size. Scattered coronary artery calcifications. Aorta normal caliber. Mediastinum/Nodes: No mediastinal, hilar, or axillary adenopathy. Trachea and esophagus are unremarkable. Thyroid unremarkable. Lungs/Pleura: Scattered ground-glass opacities, likely areas of atelectasis. No confluent airspace opacities or effusions. Musculoskeletal: Chest wall soft tissues  are unremarkable. No acute bony abnormality. CT ABDOMEN PELVIS FINDINGS Hepatobiliary: No focal liver abnormality is seen. Status post cholecystectomy. No biliary dilatation. Pancreas: No focal abnormality or ductal dilatation. Spleen: No focal abnormality.  Normal size. Adrenals/Urinary Tract: No adrenal abnormality. No focal renal abnormality. No stones or hydronephrosis. Urinary bladder is unremarkable. Stomach/Bowel: Stomach, large and small bowel grossly unremarkable. Vascular/Lymphatic: No evidence of aneurysm or adenopathy. Reproductive: Stable  uterine fibroid.  No acute findings. Other: No free fluid or free air. Musculoskeletal: No acute bony abnormality. IMPRESSION: No acute findings in the chest, abdomen or pelvis. Scattered aortic atherosclerosis crash that scattered coronary artery disease. Scattered ground-glass opacities in the lungs, favor areas of atelectasis. Fibroid uterus. Electronically Signed   By: Charlett Nose M.D.   On: 04/25/2023 18:11   DG Chest 2 View Result Date: 04/25/2023 CLINICAL DATA:  Chest pain. EXAM: CHEST - 2 VIEW COMPARISON:  03/22/2023. FINDINGS: Bilateral lung fields are clear. No pulmonary edema. Bilateral costophrenic angles are clear. Normal cardio-mediastinal silhouette. No acute osseous abnormalities. The soft tissues are within normal limits. Right IJ hemodialysis catheter noted with its tip overlying the cavoatrial junction region, unchanged. IMPRESSION: No active cardiopulmonary disease. Electronically Signed   By: Jules Schick M.D.   On: 04/25/2023 15:57   Medications:  heparin sodium (porcine)      (feeding supplement) PROSource Plus  30 mL Oral BID BM   atorvastatin  10 mg Oral QPM   busPIRone  5 mg Oral TID   famotidine  20 mg Oral QAC breakfast   ferric citrate  210 mg Oral TID WC   gabapentin  100 mg Oral TID   Gerhardt's butt cream   Topical BID   leptospermum manuka honey  1 Application Topical Daily   magic mouthwash w/lidocaine  5 mL Oral QID   metoprolol tartrate  25 mg Oral BID   midodrine  5 mg Oral TID WC   mirtazapine  15 mg Oral QHS   pantoprazole  40 mg Oral BID   sodium bicarbonate  650 mg Oral BID   sodium chloride flush  3 mL Intravenous Q12H   sodium chloride flush  3 mL Intravenous Q12H   torsemide  100 mg Oral Once per day on Sunday Monday Wednesday Friday     HD orders: [facility] T,TH,S 4 hr 180NRe 400/800 112 kg 3.0 K/2.5 Ca TDC - No Heparin  - Hectorol 5 mcg IV three times per week - Mircera 150 mcg IV q 2 weeks (last dose 04/09/2023)     Assessment/  Plan: Volume overload - d/t missing HD which were transportation issues per the pt. Mostly LE edema. Max UF w/ HD.  ESRD - on HD TTS. Next HD 04/28/2023 Hypokalemia-Follow labs. use 3.0 K bath. Supplement oral K+ if needed.  Hypotension - chronic hypotension it appears. BP here is soft to low-normal. On midodrine 5 tid, takes BB for afib most likely.  Volume - By bed scales, now under OP EDW. Lungs clear, chronic LE edema. Lower EDW if possible on DD. Continue Torsemide on non HD days Anemia of esrd - Hb 8- 9 here. ESA was due 04/23/2023, Given Darbe 200 mcg sQ 04/26/2023. Follow HGB. Transfuse prn.  Secondary hyperparathyroidism - CCa in range, add on phos. Resume binders w/ meals.  Atrial fib - chronic. No anticoagulation.   Jensyn Shave H. Yarely Bebee NP-C 04/27/2023, 8:18 AM  [employer] [phone]

## 2023-04-27 NOTE — [place] FL2 (Signed)
[place] MEDICAID FL2 LEVEL OF CARE FORM     IDENTIFICATION  Patient Name: Deborah [name] Birthdate: Feb 01, 1966 Sex: female Admission Date (Current Location): 04/25/2023  [place] and [place] Number:  Haynes Bast [id number] Facility and Address:  [facility] [place], [address] [place], [place] [address]      Provider Number: [id number]  Attending Physician Name and Address:  Alba Cory, MD  Relative Name and Phone Number:  [place], Youngers Mother [phone]    Current Level of Care: Hospital Recommended Level of Care: Skilled Nursing Facility Prior Approval Number:    Date Approved/Denied:   PASRR Number: [id number]  Discharge Plan: SNF    Current Diagnoses: Patient Active Problem List   Diagnosis Date Noted   Volume overload 04/25/2023   Chronic diastolic CHF (congestive heart failure) (HCC) 04/25/2023   Carcinoid tumor of abdomen 04/25/2023   PVD (peripheral vascular disease) (HCC) 04/25/2023   History of GI bleed 04/25/2023   History of arteriovenous malformation (AVM) 04/25/2023   Generalized anxiety disorder 04/25/2023   History of reactive airway disease 04/25/2023   History of spinal stenosis 04/25/2023   Chronic diabetic ulcer of left foot determined by examination (HCC) 04/25/2023   Diarrhea 03/01/2023   Intractable diarrhea 02/28/2023   Sinus tachycardia 02/17/2023   Cellulitis of heel, left 02/17/2023   Hx of BKA, right (HCC) 01/31/2023   Tardive dyskinesia 01/06/2023   Peutz-Jeghers polyps of small bowel (HCC) 12/24/2022   Gastric AVM 12/23/2022   Anemia of chronic disease 12/14/2022   Pressure injury of skin 11/30/2022   Non-pressure chronic ulcer of right heel and midfoot limited to breakdown of skin (HCC) 11/24/2022   ESRD on dialysis (HCC) 09/09/2022   Peripheral neuropathy 09/09/2022   (HFpEF) heart failure with preserved ejection fraction (HCC) 09/09/2022   CHF (congestive heart failure) (HCC) 07/13/2022    Hyponatremia 05/29/2022   Leukocytosis 05/11/2022   Edema of left upper extremity 03/21/2022   Acute pyelonephritis    Paroxysmal atrial fibrillation (HCC)    Chronic pain syndrome 09/04/2019   Malignant carcinoid tumor of duodenum (HCC)    NASH (nonalcoholic steatohepatitis) 06/05/2019   Chronic diarrhea    Restrictive lung disease secondary to obesity    History of gastric ulcer    Fibroid uterus 02/23/2019   Abdominal pain 07/17/2018   Anxiety 11/29/2017   Spinal stenosis, lumbar region with neurogenic claudication 08/03/2017   GERD (gastroesophageal reflux disease) 03/19/2017   Chronic depression 03/19/2017   Morbid obesity (HCC)    Normocytic anemia 08/16/2016   Gastroparesis 08/16/2016   Chronic gout 06/05/2016   Chronic hypokalemia 09/26/2015   Hypomagnesemia and hypokalemia 09/26/2015   Insulin dependent type 2 diabetes mellitus (HCC) 05/25/2015   Essential hypertension 09/28/2013    Orientation RESPIRATION BLADDER Height & Weight     Self, Time, Situation, Place  Normal Continent Weight: 226 lb 13.7 oz (102.9 kg) Height:     BEHAVIORAL SYMPTOMS/MOOD NEUROLOGICAL BOWEL NUTRITION STATUS      Continent Diet (see discharge summary)  AMBULATORY STATUS COMMUNICATION OF NEEDS Skin   Total Care Verbally Other (Comment) (Left foot wound: full thickness ulcer)                       Personal Care Assistance Level of Assistance  Bathing, Feeding, Dressing Bathing Assistance: Maximum assistance Feeding assistance: Independent Dressing Assistance: Maximum assistance     Functional Limitations Info  Sight, Hearing, Speech Sight Info: Adequate Hearing Info: Adequate Speech Info:  Adequate    SPECIAL CARE FACTORS FREQUENCY  PT (By licensed PT), OT (By licensed OT)     PT Frequency: 5x week OT Frequency: 5x week            Contractures Contractures Info: Not present    Additional Factors Info  Code Status, Allergies Code Status Info: full Allergies Info:  Gabapentin, Isovue (Iopamidol), Nsaids, Penicillins, Reglan (Metoclopramide), Valium (Diazepam), Zestril (Lisinopril), Tolectin (Tolmetin), Asa (Aspirin), Aspartame And Phenylalanine, Bentyl (Dicyclomine), Hibiclens (Chlorhexidine Gluconate), Flexeril (Cyclobenzaprine), Oxycontin (Oxycodone), Rifamycins, Tylenol (Acetaminophen), Ultram (Tramadol)           Current Medications (04/27/2023):  This is the current hospital active medication list Current Facility-Administered Medications  Medication Dose Route Frequency Provider Last Rate Last Admin   (feeding supplement) PROSource Plus liquid 30 mL  30 mL Oral BID BM Sundil, Subrina, MD       atorvastatin (LIPITOR) tablet 10 mg  10 mg Oral QPM Sundil, Subrina, MD       busPIRone (BUSPAR) tablet 5 mg  5 mg Oral TID Janalyn Shy, Subrina, MD   5 mg at 04/25/23 2035   famotidine (PEPCID) IVPB 20 mg premix  20 mg Intravenous QAC breakfast [name], Belkys A, MD 100 mL/hr at 04/27/23 1253 20 mg at 04/27/23 1253   ferric citrate (AURYXIA) tablet 210 mg  210 mg Oral TID WC Sundil, Subrina, MD       gabapentin (NEURONTIN) capsule 100 mg  100 mg Oral TID Janalyn Shy, Subrina, MD       Gerhardt's butt cream   Topical BID Almon Hercules, MD   Given at 04/27/23 1204   heparin sodium (porcine) injection 3,800 Units  3,800 Units Intracatheter Continuous PRN Delano Metz, MD   3,800 Units at 04/26/23 1151   HYDROmorphone (DILAUDID) injection 0.5 mg  0.5 mg Intravenous Q4H PRN [name], Belkys A, MD   0.5 mg at 04/27/23 1252   HYDROmorphone (DILAUDID) tablet 2 mg  2 mg Oral Q6H PRN Candelaria Stagers T, MD   2 mg at 04/26/23 1854   hydrOXYzine (ATARAX) tablet 25 mg  25 mg Oral TID PRN Janalyn Shy, Subrina, MD   25 mg at 04/26/23 1125   leptospermum manuka honey (MEDIHONEY) paste 1 Application  1 Application Topical Daily Almon Hercules, MD   1 Application at 04/27/23 1212   levalbuterol (XOPENEX) nebulizer solution 0.63 mg  0.63 mg Nebulization Q6H PRN Janalyn Shy, Subrina, MD       liver  oil-zinc oxide (DESITIN) 40 % ointment   Topical Q8H PRN Janalyn Shy, Subrina, MD       loperamide (IMODIUM) capsule 2 mg  2 mg Oral PRN Janalyn Shy, Subrina, MD       magic mouthwash w/lidocaine  5 mL Oral QID Almon Hercules, MD       methocarbamol (ROBAXIN) tablet 500 mg  500 mg Oral Q6H PRN Janalyn Shy, Subrina, MD   500 mg at 04/25/23 2034   metoprolol tartrate (LOPRESSOR) tablet 25 mg  25 mg Oral BID Sundil, Subrina, MD       midodrine (PROAMATINE) tablet 5 mg  5 mg Oral TID WC Sundil, Subrina, MD   5 mg at 04/27/23 1254   mirtazapine (REMERON) tablet 15 mg  15 mg Oral QHS Sundil, Subrina, MD       ondansetron [facility]) tablet 4 mg  4 mg Oral Q6H PRN Janalyn Shy, Subrina, MD       Or   ondansetron (ZOFRAN) injection 4 mg  4 mg Intravenous Q6H PRN  Janalyn Shy, Subrina, MD   4 mg at 04/27/23 1216   pantoprazole (PROTONIX) injection 40 mg  40 mg Intravenous Q12H [name], Belkys A, MD   40 mg at 04/27/23 1156   sodium bicarbonate tablet 650 mg  650 mg Oral BID Sundil, Subrina, MD       sodium chloride flush (NS) 0.9 % injection 3 mL  3 mL Intravenous Q12H Sundil, Subrina, MD   3 mL at 04/26/23 2152   sodium chloride flush (NS) 0.9 % injection 3 mL  3 mL Intravenous Q12H Sundil, Subrina, MD   3 mL at 04/27/23 1157   sodium chloride flush (NS) 0.9 % injection 3 mL  3 mL Intravenous PRN Janalyn Shy, Subrina, MD       torsemide [facility]) tablet 100 mg  100 mg Oral Once per day on Sunday Monday Wednesday Friday Pola Corn, NP         Discharge Medications: Please see discharge summary for a list of discharge medications.  Relevant Imaging Results:  Relevant Lab Results:   Additional Information [id number]. HD pt: TTS at [facility], Einar Crow, LCSW

## 2023-04-27 NOTE — Evaluation (Signed)
Clinical/Bedside Swallow Evaluation Patient Details  Name: Deborah [name] MRN: [id number] Date of Birth: 1965/03/26  Today's Date: 04/27/2023 Time: SLP Start Time (ACUTE ONLY): 1000 SLP Stop Time (ACUTE ONLY): 1020 SLP Time Calculation (min) (ACUTE ONLY): 20 min  Past Medical History:  Past Medical History:  Diagnosis Date   Acute back pain with sciatica, left    Acute back pain with sciatica, right    Acute encephalopathy 05/29/2022   Acute osteomyelitis of right calcaneus (HCC) 12/21/2022   Anemia, unspecified    Atrial fibrillation with RVR (HCC)-resolved 09/09/2022   Atypical chest pain 09/10/2021   Cancer (HCC)    Carcinoid tumor of duodenum    Chest pain with normal coronary angiography 2019   Chronic a-fib (HCC) 09/09/2022   Chronic pain    Chronic systolic CHF (congestive heart failure) (HCC)    Dehiscence of amputation stump of right lower extremity (HCC) 01/29/2023   Diabetes mellitus    DKA (diabetic ketoacidosis) (HCC)    Drug-seeking behavior    21 hospitalizations and 14 CT a/p in 2 years for N/V and abdominal pain, demanding only IV dilaudid   Elevated troponin    chronic   Esophageal reflux    Fibromyalgia    Gastric ulcer    Gastroparesis    Gout    HCAP (healthcare-associated pneumonia) 06/19/2022   Hyperlipidemia    Hyperosmolar hyperglycemic state (HHS) (HCC) 05/11/2022   Hyperosmolar non-ketotic state due to type 2 diabetes mellitus (HCC) 05/11/2022   Hypertension    Hypomagnesemia    Lumbosacral stenosis    LVH (left ventricular hypertrophy)    Morbid obesity (HCC)    Nausea & vomiting 09/09/2022   NICM (nonischemic cardiomyopathy) (HCC)    PAF (paroxysmal atrial fibrillation) (HCC)    Sepsis (HCC) 11/23/2022   Stroke (HCC) 02/2011   Symptomatic anemia 12/14/2022   Thrombocytosis    Vitamin B12 deficiency anemia    Past Surgical History:  Past Surgical History:  Procedure Laterality Date   ABDOMINAL AORTOGRAM W/LOWER EXTREMITY N/A  11/29/2022   Procedure: ABDOMINAL AORTOGRAM W/LOWER EXTREMITY;  Surgeon: Daria Pastures, MD;  Location: [facility];  Service: Cardiovascular;  Laterality: N/A;   AMPUTATION Right 12/29/2022   Procedure: RIGHT BELOW KNEE AMPUTATION;  Surgeon: Nadara Mustard, MD;  Location: [facility] OR;  Service: Orthopedics;  Laterality: Right;   AV FISTULA PLACEMENT Left 06/30/2022   Procedure: LEFT BRACHIOCEPHALIC ARTERIOVENOUS (AV) FISTULA CREATION;  Surgeon: Cephus Shelling, MD;  Location: [facility] OR;  Service: Vascular;  Laterality: Left;   BIOPSY  07/27/2019   Procedure: BIOPSY;  Surgeon: Vida Rigger, MD;  Location: [facility] ENDOSCOPY;  Service: Endoscopy;;   BIOPSY  07/30/2019   Procedure: BIOPSY;  Surgeon: Kathi Der, MD;  Location: [facility] ENDOSCOPY;  Service: Gastroenterology;;   CATARACT EXTRACTION  01/2014   CHOLECYSTECTOMY     COLONOSCOPY WITH PROPOFOL N/A 07/30/2019   Procedure: COLONOSCOPY WITH PROPOFOL;  Surgeon: Kathi Der, MD;  Location: [facility] ENDOSCOPY;  Service: Gastroenterology;  Laterality: N/A;   ESOPHAGOGASTRODUODENOSCOPY N/A 07/27/2019   Procedure: ESOPHAGOGASTRODUODENOSCOPY (EGD);  Surgeon: Vida Rigger, MD;  Location: [facility];  Service: Endoscopy;  Laterality: N/A;   ESOPHAGOGASTRODUODENOSCOPY N/A 07/26/2020   Procedure: ESOPHAGOGASTRODUODENOSCOPY (EGD);  Surgeon: Willis Modena, MD;  Location: [facility];  Service: Endoscopy;  Laterality: N/A;   ESOPHAGOGASTRODUODENOSCOPY (EGD) WITH PROPOFOL N/A 08/02/2019   Procedure: ESOPHAGOGASTRODUODENOSCOPY (EGD) WITH PROPOFOL;  Surgeon: Kathi Der, MD;  Location: [facility];  Service: Gastroenterology;  Laterality: N/A;   ESOPHAGOGASTRODUODENOSCOPY (EGD)  WITH PROPOFOL N/A 12/23/2022   Procedure: ESOPHAGOGASTRODUODENOSCOPY (EGD) WITH PROPOFOL;  Surgeon: Shellia Cleverly, DO;  Location: [facility] ENDOSCOPY;  Service: Gastroenterology;  Laterality: N/A;   FISTULA SUPERFICIALIZATION Left 12/31/2022   Procedure: LEFT ARM FISTULA  TRANSPOSITION;  Surgeon: Maeola Harman, MD;  Location: [facility] OR;  Service: Vascular;  Laterality: Left;   GIVENS CAPSULE STUDY N/A 12/23/2022   Procedure: GIVENS CAPSULE STUDY;  Surgeon: Shellia Cleverly, DO;  Location: [facility] ENDOSCOPY;  Service: Gastroenterology;  Laterality: N/A;   HEMOSTASIS CLIP PLACEMENT  08/02/2019   Procedure: HEMOSTASIS CLIP PLACEMENT;  Surgeon: Kathi Der, MD;  Location: [facility]OS[facility];  Service: Gastroenterology;;   HOT HEMOSTASIS N/A 12/23/2022   Procedure: HOT HEMOSTASIS (ARGON PLASMA COAGULATION/BICAP);  Surgeon: Shellia Cleverly, DO;  Location: [facility] ENDOSCOPY;  Service: Gastroenterology;  Laterality: N/A;   IR FLUORO GUIDE CV LINE RIGHT  06/24/2022   IR US GUIDE VASC ACCESS RIGHT  06/24/2022   POLYPECTOMY  07/30/2019   Procedure: POLYPECTOMY;  Surgeon: Kathi Der, MD;  Location: [facility]OS[facility];  Service: Gastroenterology;;   POLYPECTOMY  08/02/2019   Procedure: POLYPECTOMY;  Surgeon: Kathi Der, MD;  Location: [facility] ENDOSCOPY;  Service: Gastroenterology;;   STUMP REVISION Right 02/02/2023   Procedure: REVISION RIGHT BELOW KNEE AMPUTATION;  Surgeon: Nadara Mustard, MD;  Location: [facility] OR;  Service: Orthopedics;  Laterality: Right;   HPI:  58 year old F brought to ED by EMS due to chest pain, abdominal pain, shortness of breath and increased swelling, and admitted for volume overload in the setting of missed dialysis for about 2 weeks.  Also reports poor p.o. intake in the setting of nausea, vomiting and diarrhea for about 2 days.   Patient lives home by herself and endorses she does not have anyone to help her around.  Not able to transfer using [facility] lift.  In ED, hemodynamically stable.  No indication for emergent dialysis other than volume overload.  K2.9.  BUN 46.  Bicarb 19.  Initial troponin 23> 16.  KG featured sinus rhythm with PAC and LAFB.      CT chest, abdomen and pelvis without contrast no acute chest, abdomen or pelvis abnormality.   Scattered coronary artery disease.  Scattered groundglass opacity of the lung favoring atelectasis.  PMH of esophagitis on endoscopy, ESRD on HD, diastolic CHF, PAF, IDDM-2, chronic diarrhea due to duodenal carcinoid tumor, PVD, right BKA, chronic left heel ulcer, gastric AVM, GERD, NASH, anemia, reactive dyskinesia, anxiety, depression, obesity, reactive airway disease and chronic pain syndrome    Assessment / Plan / Recommendation  Clinical Impression  Pt minimally participatory, refused most PO offered, took one small sip of water with grimacing and multiple swallows but no coughing or throat clearing. Pt reports globus and regurgitation with all PO (pt did order grilled cheese and sausage for meal). Reportedly her intake has been minimal over the week. She can only name "noodles" as something she can eat. Wants to add butter to food to make it more slippery. Says hot chocolate helps. Though observation was minimal, suspect a primary esophageal dysphagia given history of esophagitis and gastritis. Pt states she could not drink barium due to allergy to dye, though she may be thinking of IV contrast. Defer medical management to MD. SLP Visit Diagnosis: Dysphagia, unspecified (R13.10)    Aspiration Risk  Risk for inadequate nutrition/hydration    Diet Recommendation Regular;Thin liquid    Liquid Administration via: Cup;Straw Medication Administration: Whole meds with liquid Supervision: Patient able to self feed Compensations:  Follow solids with liquid    Other  Recommendations Recommended Consults: Consider esophageal assessment Oral Care Recommendations: Oral care BID    Recommendations for follow up therapy are one component of a multi-disciplinary discharge planning process, led by the attending physician.  Recommendations may be updated based on patient status, additional functional criteria and insurance authorization.  Follow up Recommendations No SLP follow up      Assistance  Recommended at Discharge    Functional Status Assessment    Frequency and Duration            Prognosis        Swallow Study   General HPI: 58 year old F brought to ED by EMS due to chest pain, abdominal pain, shortness of breath and increased swelling, and admitted for volume overload in the setting of missed dialysis for about 2 weeks.  Also reports poor p.o. intake in the setting of nausea, vomiting and diarrhea for about 2 days.   Patient lives home by herself and endorses she does not have anyone to help her around.  Not able to transfer using [facility] lift.  In ED, hemodynamically stable.  No indication for emergent dialysis other than volume overload.  K2.9.  BUN 46.  Bicarb 19.  Initial troponin 23> 16.  KG featured sinus rhythm with PAC and LAFB.      CT chest, abdomen and pelvis without contrast no acute chest, abdomen or pelvis abnormality.  Scattered coronary artery disease.  Scattered groundglass opacity of the lung favoring atelectasis.  PMH of esophagitis on endoscopy, ESRD on HD, diastolic CHF, PAF, IDDM-2, chronic diarrhea due to duodenal carcinoid tumor, PVD, right BKA, chronic left heel ulcer, gastric AVM, GERD, NASH, anemia, reactive dyskinesia, anxiety, depression, obesity, reactive airway disease and chronic pain syndrome Type of Study: Bedside Swallow Evaluation Previous Swallow Assessment: GI assessment and endoscopies Diet Prior to this Study: Regular;Thin liquids (Level 0) Temperature Spikes Noted: No Respiratory Status: Room air History of Recent Intubation: No Behavior/Cognition: Alert Oral Cavity Assessment: Within Functional Limits Oral Care Completed by SLP: No Self-Feeding Abilities: Able to feed self Patient Positioning: Upright in bed Baseline Vocal Quality: Normal Volitional Cough: Strong    Oral/Motor/Sensory Function Overall Oral Motor/Sensory Function: Within functional limits   Ice Chips     Thin Liquid Thin Liquid: Within functional  limits Presentation: Cup;Self Fed    Nectar Thick Nectar Thick Liquid: Not tested   Honey Thick Honey Thick Liquid: Not tested   Puree Puree:  (pt refused "im allergic to applesauce")   Solid    Harlon Ditty, MA CCC-SLP  Acute Rehabilitation Services Secure Chat Preferred Office [phone]  Solid: Not tested (pt refused, i cant swallow that)      Deborah [name], Riley Nearing 04/27/2023,10:47 AM

## 2023-04-27 NOTE — Evaluation (Signed)
Physical Therapy Evaluation Patient Details Name: Deborah [name] MRN: [id number] DOB: December 30, 1965 Today's Date: 04/27/2023  History of Present Illness  58 y/o female presents to [facility] on 2/17 with resports of chest pain, abdominal pain, and increased swelling, nausea, vomiting, and diahrrea. Admitted with volume overload secondary to missing dialysis. PMH includes: ESRD on dialysis, gastroparesis, GERD, anemia of chronic disease, paroxysmal atrial fibrillation, diastolic heart failure with preserved EF 55 to 60%, gout, duodenal carcinoid tumor, peripheral vascular disease status post right-sided AKA, history of gastric AVM, NASH, reactive dyskinesia, depression, anxiety, obesity, restrictive lung disease, spinal stenosis, chronic pain syndrome, insulin-dependent DM type II, chronic left heel diabetic ulcer, and history of chronic diarrhea in the setting of carcinoid tumor.  Clinical Impression  PTA, pt lives alone, requires assist for ADL's, and uses a hoyer lift to transfer in and out of bed. Unfortunately, pt states her [facility] nurse quit 2 weeks ago and she has therefore been unable to mobilize out of bed to attend HD sessions. Pt states she has had to call the ambulance for peri care assist in bed. Pt received sitting on edge of bed. Agreeable to sit up in the chair and utilized [employer] lift. Pt able to complete bed mobility at a supervision level. Pt reports generalized pain and frustration with difficulty swallowing and lack of resources. Will continue to follow acutely.       If plan is discharge home, recommend the following: Two people to help with walking and/or transfers;A lot of help with bathing/dressing/bathroom;Assistance with cooking/housework;Assist for transportation   Can travel by private vehicle   No    Equipment Recommendations None recommended by PT  Recommendations for Other Services       Functional Status Assessment Patient has had a recent decline in their functional  status and demonstrates the ability to make significant improvements in function in a reasonable and predictable amount of time.     Precautions / Restrictions Precautions Precautions: Fall;Other (comment) Precaution/Restrictions Comments: R BKA (baseline) Restrictions Weight Bearing Restrictions Per Provider Order: No      Mobility  Bed Mobility Overal bed mobility: Needs Assistance Bed Mobility: Rolling, Sit to Supine Rolling: Supervision     Sit to supine: Supervision   General bed mobility comments: Pt received sitting on EOB; able to return self to supine and roll to R/L for placement of maxi move lift pad without physical assist    Transfers Overall transfer level: Needs assistance Equipment used: Ambulation equipment used Transfers: Bed to chair/wheelchair/BSC             General transfer comment: Maxi move transfer bed to chair    Ambulation/Gait                  Stairs            Wheelchair Mobility     Tilt Bed    Modified Rankin (Stroke Patients Only)       Balance Overall balance assessment: Needs assistance Sitting-balance support: Feet unsupported Sitting balance-Leahy Scale: Good                                       Pertinent Vitals/Pain Pain Assessment Pain Intervention(s): Limited activity within patient's tolerance, Monitored during session    Home Living Family/patient expects to be discharged to:: Private residence Living Arrangements: Alone Available Help at Discharge: Other (Comment) (Ambulance service) Type of Home:  Apartment Home Access: Stairs to enter Entrance Stairs-Rails: Can reach both Entrance Stairs-Number of Steps: 2   Home Layout: One level Home Equipment: Wheelchair - manual;BSC/3in1;Hospital bed;Lift chair;Other (comment) (hoyer lift)      Prior Function Prior Level of Function : Needs assist       Physical Assist : ADLs (physical);Mobility (physical)     Mobility  Comments: Had a nurse assist with all transfers, ADLs -- howeve she quit 2-weeks ago ADLs Comments: Had a nurse assist with all transfers, ADLs -- howeve she quit 2-weeks ago     Extremity/Trunk Assessment   Upper Extremity Assessment Upper Extremity Assessment: Defer to OT evaluation    Lower Extremity Assessment Lower Extremity Assessment: Generalized weakness;RLE deficits/detail;LLE deficits/detail RLE Deficits / Details: prior BKA LLE Deficits / Details: Heel ulcer    Cervical / Trunk Assessment Cervical / Trunk Assessment: Normal  Communication   Communication Communication: No apparent difficulties    Cognition                               PT - Cognition Comments: Pt emotionally labile several times throughout session, but is able to verbalize her deficits and demonstrates clear thought process. Follows commands Following commands: Intact       Cueing       General Comments      Exercises     Assessment/Plan    PT Assessment Patient needs continued PT services  PT Problem List Decreased strength;Decreased mobility;Pain       PT Treatment Interventions Functional mobility training;Therapeutic activities;Therapeutic exercise;Balance training    PT Goals (Current goals can be found in the Care Plan section)  Acute Rehab PT Goals Patient Stated Goal: go home PT Goal Formulation: With patient Time For Goal Achievement: 05/11/23 Potential to Achieve Goals: Fair    Frequency Min 1X/week     Co-evaluation               AM-PAC PT "6 Clicks" Mobility  Outcome Measure Help needed turning from your back to your side while in a flat bed without using bedrails?: A Little Help needed moving from lying on your back to sitting on the side of a flat bed without using bedrails?: A Little Help needed moving to and from a bed to a chair (including a wheelchair)?: Total Help needed standing up from a chair using your arms (e.g., wheelchair or  bedside chair)?: Total Help needed to walk in hospital room?: Total Help needed climbing 3-5 steps with a railing? : Total 6 Click Score: 10    End of Session   Activity Tolerance: Patient tolerated treatment well Patient left: in chair;with call bell/phone within reach;with chair alarm set Nurse Communication: Mobility status PT Visit Diagnosis: Muscle weakness (generalized) (M62.81)    Time: 1610-9604 PT Time Calculation (min) (ACUTE ONLY): 36 min   Charges:   PT Evaluation $PT Eval Low Complexity: 1 Low PT Treatments $Therapeutic Activity: 8-22 mins PT General Charges $$ ACUTE PT VISIT: 1 Visit         Lillia Pauls, PT, DPT Acute Rehabilitation Services Office [phone]   Norval Morton 04/27/2023, 1:26 PM

## 2023-04-28 ENCOUNTER — Encounter (HOSPITAL_COMMUNITY)
Admission: EM | Disposition: A | Discharge status: Skilled Nursing Facility | Payer: Self-pay | Source: Home / Self Care | Attending: Internal Medicine

## 2023-04-28 ENCOUNTER — Inpatient Hospital Stay (HOSPITAL_COMMUNITY): Payer: 59 | Admitting: Anesthesiology

## 2023-04-28 DIAGNOSIS — K3189 Other diseases of stomach and duodenum: Secondary | ICD-10-CM | POA: Diagnosis not present

## 2023-04-28 DIAGNOSIS — C7A010 Malignant carcinoid tumor of the duodenum: Secondary | ICD-10-CM | POA: Diagnosis not present

## 2023-04-28 DIAGNOSIS — R1310 Dysphagia, unspecified: Secondary | ICD-10-CM | POA: Diagnosis not present

## 2023-04-28 DIAGNOSIS — K2970 Gastritis, unspecified, without bleeding: Secondary | ICD-10-CM

## 2023-04-28 DIAGNOSIS — K2950 Unspecified chronic gastritis without bleeding: Secondary | ICD-10-CM

## 2023-04-28 DIAGNOSIS — T182XXA Foreign body in stomach, initial encounter: Secondary | ICD-10-CM | POA: Diagnosis not present

## 2023-04-28 DIAGNOSIS — K2980 Duodenitis without bleeding: Secondary | ICD-10-CM | POA: Diagnosis not present

## 2023-04-28 DIAGNOSIS — R52 Pain, unspecified: Secondary | ICD-10-CM | POA: Diagnosis not present

## 2023-04-28 HISTORY — PX: SAVORY DILATION: SHX5439

## 2023-04-28 HISTORY — PX: ESOPHAGOGASTRODUODENOSCOPY (EGD) WITH PROPOFOL: SHX5813

## 2023-04-28 HISTORY — PX: BIOPSY: SHX5522

## 2023-04-28 LAB — CBC
HCT: 25.6 % — ABNORMAL LOW (ref 36.0–46.0)
Hemoglobin: 7.9 g/dL — ABNORMAL LOW (ref 12.0–15.0)
MCH: 29.7 pg (ref 26.0–34.0)
MCHC: 30.9 g/dL (ref 30.0–36.0)
MCV: 96.2 fL (ref 80.0–100.0)
Platelets: 266 10*3/uL (ref 150–400)
RBC: 2.66 MIL/uL — ABNORMAL LOW (ref 3.87–5.11)
RDW: 15 % (ref 11.5–15.5)
WBC: 10.1 10*3/uL (ref 4.0–10.5)
nRBC: 0 % (ref 0.0–0.2)

## 2023-04-28 LAB — IRON AND TIBC
Iron: 61 ug/dL (ref 28–170)
Saturation Ratios: 42 % — ABNORMAL HIGH (ref 10.4–31.8)
TIBC: 147 ug/dL — ABNORMAL LOW (ref 250–450)
UIBC: 86 ug/dL

## 2023-04-28 LAB — RENAL FUNCTION PANEL
Albumin: 2.5 g/dL — ABNORMAL LOW (ref 3.5–5.0)
Anion gap: 15 (ref 5–15)
BUN: 27 mg/dL — ABNORMAL HIGH (ref 6–20)
CO2: 21 mmol/L — ABNORMAL LOW (ref 22–32)
Calcium: 8.9 mg/dL (ref 8.9–10.3)
Chloride: 103 mmol/L (ref 98–111)
Creatinine, Ser: 6.8 mg/dL — ABNORMAL HIGH (ref 0.44–1.00)
GFR, Estimated: 7 mL/min — ABNORMAL LOW (ref >60)
Glucose, Bld: 96 mg/dL (ref 70–99)
Phosphorus: 7 mg/dL — ABNORMAL HIGH (ref 2.5–4.6)
Potassium: 2.9 mmol/L — ABNORMAL LOW (ref 3.5–5.1)
Sodium: 139 mmol/L (ref 135–145)

## 2023-04-28 LAB — GLUCOSE, CAPILLARY
Glucose-Capillary: 83 mg/dL (ref 70–99)
Glucose-Capillary: 97 mg/dL (ref 70–99)
Glucose-Capillary: 117 mg/dL — ABNORMAL HIGH (ref 70–99)

## 2023-04-28 LAB — MAGNESIUM: Magnesium: 2.1 mg/dL (ref 1.7–2.4)

## 2023-04-28 LAB — VITAMIN B12: Vitamin B-12: 392 pg/mL (ref 180–914)

## 2023-04-28 LAB — FOLATE: Folate: 8.2 ng/mL (ref >5.9)

## 2023-04-28 LAB — FERRITIN: Ferritin: 1349 ng/mL — ABNORMAL HIGH (ref 11–307)

## 2023-04-28 SURGERY — ESOPHAGOGASTRODUODENOSCOPY (EGD) WITH PROPOFOL
Anesthesia: Monitor Anesthesia Care

## 2023-04-28 MED ORDER — ANTICOAGULANT SODIUM CITRATE 4% (200MG/5ML) IV SOLN: 5.0000 mL | Status: DC | PRN

## 2023-04-28 MED ORDER — GLYCOPYRROLATE PF 0.2 MG/ML IJ SOSY
PREFILLED_SYRINGE | INTRAMUSCULAR | Status: DC | PRN
  Administered 2023-04-28: .1 mg via INTRAVENOUS

## 2023-04-28 MED ORDER — PHENYLEPHRINE HCL (PRESSORS) 10 MG/ML IV SOLN
INTRAVENOUS | Status: DC | PRN
  Administered 2023-04-28: 80 ug via INTRAVENOUS

## 2023-04-28 MED ORDER — ALTEPLASE 2 MG IJ SOLR: 2.0000 mg | Freq: Once | INTRAMUSCULAR | Status: DC | PRN

## 2023-04-28 MED ORDER — MIDODRINE HCL 5 MG PO TABS: 5.0000 mg | ORAL_TABLET | Freq: Once | ORAL | Status: DC

## 2023-04-28 MED ORDER — HYDROMORPHONE HCL 1 MG/ML IJ SOLN
0.5000 mg | Freq: Four times a day (QID) | INTRAMUSCULAR | Status: DC | PRN
  Administered 2023-04-28 – 2023-05-01 (×11): 0.5 mg via INTRAVENOUS
  Filled 2023-04-28 (×11): qty 0.5

## 2023-04-28 MED ORDER — HEPARIN SODIUM (PORCINE) 1000 UNIT/ML DIALYSIS: 1000.0000 [IU] | INTRAMUSCULAR | Status: DC | PRN

## 2023-04-28 MED ORDER — DEXMEDETOMIDINE HCL IN NACL 80 MCG/20ML IV SOLN
INTRAVENOUS | Status: DC | PRN
Start: 2023-04-28 — End: 2023-04-28
  Administered 2023-04-28: 8 ug via INTRAVENOUS

## 2023-04-28 MED ORDER — RISPERIDONE 1 MG PO TABS
1.0000 mg | ORAL_TABLET | Freq: Every day | ORAL | Status: DC
  Administered 2023-04-29: 1 mg via ORAL
  Filled 2023-04-28 (×2): qty 4
  Filled 2023-04-28 (×3): qty 1

## 2023-04-28 MED ORDER — LIDOCAINE 2% (20 MG/ML) 5 ML SYRINGE
INTRAMUSCULAR | Status: DC | PRN
  Administered 2023-04-28: 60 mg via INTRAVENOUS

## 2023-04-28 MED ORDER — POTASSIUM CHLORIDE 20 MEQ PO PACK
40.0000 meq | PACK | Freq: Once | ORAL | Status: AC
  Administered 2023-04-28: 40 meq via ORAL
  Filled 2023-04-28: qty 2

## 2023-04-28 MED ORDER — PROPOFOL 500 MG/50ML IV EMUL
INTRAVENOUS | Status: DC | PRN
  Administered 2023-04-28: 100 ug/kg/min via INTRAVENOUS

## 2023-04-28 MED ORDER — PROPOFOL 10 MG/ML IV BOLUS
INTRAVENOUS | Status: DC | PRN
  Administered 2023-04-28 (×2): 30 mg via INTRAVENOUS
  Administered 2023-04-28: 100 mg via INTRAVENOUS
  Administered 2023-04-28: 30 mg via INTRAVENOUS

## 2023-04-28 SURGICAL SUPPLY — 14 items
BLOCK BITE 60FR ADLT L/F BLUE (MISCELLANEOUS) ×2 IMPLANT
ELECT REM PT RETURN 9FT ADLT (ELECTROSURGICAL) IMPLANT
ELECTRODE REM PT RTRN 9FT ADLT (ELECTROSURGICAL) IMPLANT
FORCEP RJ3 GP 1.8X160 W-NEEDLE (CUTTING FORCEPS) IMPLANT
FORCEPS BIOP RAD 4 LRG CAP 4 (CUTTING FORCEPS) IMPLANT
NDL SCLEROTHERAPY 25GX240 (NEEDLE) IMPLANT
NEEDLE SCLEROTHERAPY 25GX240 (NEEDLE) IMPLANT
PROBE APC STR FIRE (PROBE) IMPLANT
PROBE INJECTION GOLD 7FR (MISCELLANEOUS) IMPLANT
SNARE SHORT THROW 13M SML OVAL (MISCELLANEOUS) IMPLANT
SYR 50ML LL SCALE MARK (SYRINGE) IMPLANT
TUBING ENDO SMARTCAP PENTAX (MISCELLANEOUS) ×4 IMPLANT
TUBING IRRIGATION ENDOGATOR (MISCELLANEOUS) ×2 IMPLANT
WATER STERILE IRR 1000ML POUR (IV SOLUTION) IMPLANT

## 2023-04-28 NOTE — Plan of Care (Signed)

## 2023-04-28 NOTE — Procedures (Signed)
Received patient in bed to unit.  Alert and oriented.  Informed consent signed and in chart.   TX duration: 2 hours 49 min  Patient tolerated well however system clotted off.  Transported back to the room  Alert, without acute distress.  Hand-off given to patient's nurse.   Access used: right cath Access issues: none  Total UF removed: 1.8 liters Medication(s) given: midodrine   Lu Duffel, RN [facility] Dialysis Unit

## 2023-04-28 NOTE — Procedures (Signed)
Machine alarming, increased TMP. Flushed dialyzer but unable to fix. System clotted off. Asked patient if she wanted me to get a new system set up so she could finish her treatment. Patient said "no." Refused to sign AMA.

## 2023-04-28 NOTE — Progress Notes (Signed)
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  Also reports poor oral intake in the setting of nausea, vomiting and diarrhea for 2 days.  She lives at home by herself and endorses she does not have anyone to help her around.  She is not able to transfer using [facility] lift.  She was found to have potassium of 2.9, BUN 46, bicarb 19, troponin 23--- 16.  CT chest abdomen and pelvis without contrast no acute chest abdomen and pelvis abnormality.  Scattered coronary artery disease, scattered groundglass opacity of the lung favor atelectasis.  Fibroid uterus.  Nephrology was consulted patient was admitted for hemodialysis and further care.    Assessment & Plan:   Principal Problem:   Volume overload Active Problems:   Chronic hypokalemia   Chronic diarrhea   ESRD on dialysis (HCC)   Anemia of chronic disease   Paroxysmal atrial fibrillation (HCC)   GERD (gastroesophageal reflux disease)   Insulin dependent type 2 diabetes mellitus (HCC)   Chronic gout   Gastroparesis   Chronic depression   NASH (nonalcoholic steatohepatitis)   Chronic pain syndrome   Hx of BKA, right (HCC)   Chronic diastolic CHF (congestive heart failure) (HCC)   Carcinoid tumor of abdomen   PVD (peripheral vascular disease) (HCC)   History of GI bleed   History of arteriovenous  malformation (AVM)   Generalized anxiety disorder   History of reactive airway disease   History of spinal stenosis   Chronic diabetic ulcer of left foot determined by examination (HCC)   Dysphagia   1-Volume overload secondary to missing hemodialysis Acute on chronic diastolic heart failure CT chest with scattered groundglass opacity ESRD on hemodialysis, noncompliance due to transportation issues Volume managed with hemodialysis.  Nephrology consulted had hemodialysis 12/18. Next HD 2/20.  Chronic hypotension: Continue with midodrine  Chest pain/abdominal pain, nausea vomiting Reports that oral Dilaudid has not been helping with her pain.  She usually required few days of IV pain medication. CT abdomen and pelvis with no acute findings IV Protonix. Start weaning IV dilaudid.  She has a history of carcinoid tumor Underwent endoscopy 2/20: nodular mucosa duodenum, Normal esophagus dilated, retained gastric fluid.   Nausea vomiting diarrhea: Suspect related to duodenal carcinoid tumor GI has been consulted  Dysphagia: Report inability to swallow pills and reports something got stuck GI has been consulted. Underwent endoscopy, esophagus normal, dilated.   Anemia of chronic disease secondary to ESRD: Stable. Monitor hb  IDDM-2: A1c 5.7% on 11/29.  Monitor  CBG     Paroxysmal atrial fibrillation: Not on anticoagulation due to prior GI bleed -Continue metoprolol 25 mg twice daily. -Optimize electrolytes.   GERD/history of GIB/AVM -Continue Protonix and pain present   Anxiety/depression/chronic pain syndrome: -Continue home meds-BuSpar, Atarax, gabapentin Psych consulted,reported suicidal thought to SW  Gastroparesis: Unfortunately allergic to Reglan.  Also on opiate which might not be a good choice.  Wean IV pain meds.  NASH (nonalcoholic steatohepatitis) -Continue to monitor hepatic function panel.   Right BKA/ambulatory dysfunction: Patient reports using Hoyer  lift for transfer.  Sitting on bedside during my evaluation. -PT/OT eval   Carcinoid tumor of abdomen/chronic diarrhea: Reports 2-3 BMs daily at baseline. -Continue loperamide.   PVD (peripheral vascular disease) (HCC) -Continue Lipitor   History of reactive airway disease: Stable. - continue Xopenex as needed    History of spinal stenosis with associated chronic pain syndrome -Continue gabapentin, Robaxin Dilaudid as needed   Chronic diabetic ulcer of left foot determined by examination (HCC) -Wound care consulted.   Morbid obesity: Elevated BMI with comorbidity as above Body mass index is 38.04 kg/m.        Pressure Injury 11/23/22 Ischial tuberosity Left Unstageable - Full thickness tissue loss in which the base of the injury is covered by slough (yellow, tan, gray, green or brown) and/or eschar (tan, brown or black) in the wound bed. (Active)  11/23/22 0853  Location: Ischial tuberosity  Location Orientation: Left  Staging: Unstageable - Full thickness tissue loss in which the base of the injury is covered by slough (yellow, tan, gray, green or brown) and/or eschar (tan, brown or black) in the wound bed.  Wound Description (Comments):   Present on Admission: Yes     Pressure Injury 11/24/22 Ischial tuberosity Right Stage 2 -  Partial thickness loss of dermis presenting as a shallow open injury with a red, pink wound bed without slough. stage two injury to upper thigh into groin region. (Active)  11/24/22 1452  Location: Ischial tuberosity  Location Orientation: Right  Staging: Stage 2 -  Partial thickness loss of dermis presenting as a shallow open injury with a red, pink wound bed without slough.  Wound Description (Comments): stage two injury to upper thigh into groin region.  Present on Admission: Yes                  Estimated body mass index is 36.62 kg/m as calculated from the following:   Height as of 03/22/23: 5\' 6"  (1.676 m).   Weight as of this  encounter: 102.9 kg.   DVT prophylaxis: SCD Code Status: Full code Family Communication: Disposition Plan:  Status is: Inpatient Remains inpatient appropriate because: management of pain     Consultants:  GI Nephrology   Procedures:  HD   Antimicrobials:    Subjective Came from endoscopy. Ask for carb modified diet, will defer to rena;l Report abdominal pain getting better.   Objective: Vitals:   04/28/23 1226 04/28/23 1230 04/28/23 1234 04/28/23 1303  BP: 106/77 105/66  (!) 120/90  Pulse: (!) 105 96 97 100  Resp: 20 (!) 22 19   Temp:      TempSrc:      SpO2: 100% 97% 100%   Weight:       No intake or output data in the 24 hours ending 04/28/23 1316  Filed Weights   04/25/23 2027 04/26/23 0839 04/26/23 1230  Weight: 106.5 kg 106.9 kg 102.9 kg    Examination:  General exam: NAD  Respiratory system: CTA Cardiovascular system: S 1, S 2 RRR Gastrointestinal system: BS present, soft, nt Central nervous system: alert Data Reviewed: I have personally reviewed following labs and imaging studies  CBC: Recent Labs  Lab 04/25/23 1409 04/25/23 1624 04/26/23 0619 04/28/23 0457  WBC 12.6*  --  12.9* 10.1  HGB 8.2* 9.2* 8.2* 7.9*  HCT 27.2* 27.0* 25.9* 25.6*  MCV 95.4  --  94.5  96.2  PLT 288  --  273 266   Basic Metabolic Panel: Recent Labs  Lab 04/25/23 1409 04/25/23 1624 04/26/23 0618 04/26/23 0619 04/28/23 0510  NA 135 136 136 135 139  K 2.9* 2.8* 3.1* 3.0* 2.9*  CL 105 107 104 105 103  CO2 17*  --  17* 17* 21*  GLUCOSE 131* 115* 81 90 96  BUN 46* 41* 50* 48* 27*  CREATININE 8.66* 9.50* 8.66* 8.88* 6.80*  CALCIUM 8.2*  --  8.4* 8.5* 8.9  MG  --   --   --   --  2.1  PHOS  --   --  10.7*  --  7.0*   GFR: Estimated Creatinine Clearance: 10.8 mL/min (A) (by C-G formula based on SCr of 6.8 mg/dL (H)). Liver Function Tests: Recent Labs  Lab 04/25/23 1409 04/26/23 0618 04/26/23 0619 04/28/23 0510  AST 9*  --  9*  --   ALT 8  --  7  --    ALKPHOS 80  --  78  --   BILITOT 0.4  --  0.5  --   PROT 6.5  --  6.1*  --   ALBUMIN 2.3* 2.2* 2.1* 2.5*   Recent Labs  Lab 04/25/23 1409  LIPASE 24   No results for input(s): "AMMONIA" in the last 168 hours. Coagulation Profile: No results for input(s): "INR", "PROTIME" in the last 168 hours. Cardiac Enzymes: No results for input(s): "CKTOTAL", "CKMB", "CKMBINDEX", "TROPONINI" in the last 168 hours. BNP (last 3 results) No results for input(s): "PROBNP" in the last 8760 hours. HbA1C: No results for input(s): "HGBA1C" in the last 72 hours. CBG: Recent Labs  Lab 04/27/23 0621 04/27/23 1136 04/27/23 1631 04/27/23 2046 04/28/23 0624  GLUCAP 88 81 92 96 83   Lipid Profile: No results for input(s): "CHOL", "HDL", "LDLCALC", "TRIG", "CHOLHDL", "LDLDIRECT" in the last 72 hours. Thyroid Function Tests: No results for input(s): "TSH", "T4TOTAL", "FREET4", "T3FREE", "THYROIDAB" in the last 72 hours. Anemia Panel: Recent Labs    04/28/23 0510  VITAMINB12 392  FOLATE 8.2  FERRITIN 1,349*  TIBC 147*  IRON 61   Sepsis Labs: No results for input(s): "PROCALCITON", "LATICACIDVEN" in the last 168 hours.  No results found for this or any previous visit (from the past 240 hours).       Radiology Studies: No results found.       Scheduled Meds:  (feeding supplement) PROSource Plus  30 mL Oral BID BM   atorvastatin  10 mg Oral QPM   busPIRone  5 mg Oral TID   ferric citrate  210 mg Oral TID WC   gabapentin  100 mg Oral TID   Gerhardt's butt cream   Topical BID   leptospermum manuka honey  1 Application Topical Daily   magic mouthwash w/lidocaine  5 mL Oral QID   metoprolol tartrate  25 mg Oral BID   midodrine  5 mg Oral TID WC   mirtazapine  15 mg Oral QHS   pantoprazole (PROTONIX) IV  40 mg Intravenous Q12H   sodium bicarbonate  650 mg Oral BID   sodium chloride flush  3 mL Intravenous Q12H   sodium chloride flush  3 mL Intravenous Q12H   torsemide  100 mg  Oral Once per day on Sunday Monday Wednesday Friday   Continuous Infusions:  famotidine (PEPCID) IV 20 mg (04/28/23 0846)   heparin sodium (porcine)       LOS: 3 days    Time spent:  35 minutes    Alba Cory, MD [facility]   If 7PM-7AM, please contact night-coverage [email or web address]  04/28/2023, 1:16 PM

## 2023-04-28 NOTE — Progress Notes (Signed)
Pt is refusing all PO meds stating that she can't swallow. Pt did eat a sandwich, chips, and then liquids. Pt educated on the importance of each medication without any interest in taking her medication. Pt also refused wound care stating that she "will get someone to change it in the morning." Pt again education on the risk of skin breakdown. RN is contacting on call provider to make aware as I was given in report that the providers are aware.

## 2023-04-28 NOTE — Anesthesia Preprocedure Evaluation (Signed)
Anesthesia Evaluation  Patient identified by MRN, date of birth, ID band Patient awake    Reviewed: Allergy & Precautions, NPO status , Patient's Chart, lab work & pertinent test results  Airway Mallampati: II  TM Distance: >3 FB Neck ROM: Full    Dental no notable dental hx. (+) Teeth Intact, Dental Advisory Given   Pulmonary neg pulmonary ROS   Pulmonary exam normal breath sounds clear to auscultation       Cardiovascular hypertension, + Peripheral Vascular Disease and +CHF  (-) CAD Normal cardiovascular exam Rhythm:Regular Rate:Normal  09/2022 echo  1. Left ventricular ejection fraction, by estimation, is 55 to 60%. The  left ventricle has normal function. Left ventricular diastolic parameters  are consistent with Grade I diastolic dysfunction (impaired relaxation).   2. Right ventricular systolic function is normal. The right ventricular  size is normal.   3. The mitral valve is normal in structure. No evidence of mitral valve  regurgitation.   4. The aortic valve is tricuspid.   5. The inferior vena cava is normal in size with greater than 50%  respiratory variability, suggesting right atrial pressure of 3 mmHg.      Neuro/Psych   Anxiety Depression    CVA, Residual Symptoms    GI/Hepatic PUD,GERD  ,,  Endo/Other  diabetes    Renal/GU Renal diseaseLab Results      Component                Value               Date                      NA                       139                 04/28/2023                CL                       103                 04/28/2023                K                        2.9 (L)             04/28/2023                CO2                      21 (L)              04/28/2023                BUN                      27 (H)              04/28/2023                CREATININE               6.80 (H)            04/28/2023  GFRNONAA                 7 (L)               04/28/2023                 CALCIUM                  8.9                 04/28/2023                PHOS                     7.0 (H)             04/28/2023                ALBUMIN                  2.5 (L)             04/28/2023                GLUCOSE                  96                  04/28/2023                Musculoskeletal  (+)  Fibromyalgia -  Abdominal   Peds  Hematology  (+) Blood dyscrasia, anemia Lab Results      Component                Value               Date                      WBC                      10.1                04/28/2023                HGB                      7.9 (L)             04/28/2023                HCT                      25.6 (L)            04/28/2023                MCV                      96.2                04/28/2023                PLT                      266                 04/28/2023              Anesthesia Other Findings   Reproductive/Obstetrics  Anesthesia Physical Anesthesia Plan  ASA: 4  Anesthesia Plan: MAC   Post-op Pain Management:    Induction:   PONV Risk Score and Plan: Propofol infusion and Treatment may vary due to age or medical condition  Airway Management Planned: Natural Airway and Nasal Cannula  Additional Equipment: None  Intra-op Plan:   Post-operative Plan:   Informed Consent: I have reviewed the patients History and Physical, chart, labs and discussed the procedure including the risks, benefits and alternatives for the proposed anesthesia with the patient or authorized representative who has indicated his/her understanding and acceptance.     Dental advisory given  Plan Discussed with: CRNA  Anesthesia Plan Comments:        Anesthesia Quick Evaluation

## 2023-04-28 NOTE — Interval H&P Note (Signed)
History and Physical Interval Note:  04/28/2023 10:46 AM  Deborah [name]  has presented today for surgery, with the diagnosis of Dysphagia, nausea, and vomiting.  The various methods of treatment have been discussed with the patient and family. After consideration of risks, benefits and other options for treatment, the patient has consented to  Procedure(s): ESOPHAGOGASTRODUODENOSCOPY (EGD) WITH PROPOFOL (N/A) as a surgical intervention.  The patient's history has been reviewed, patient examined, no change in status, stable for surgery.  I have reviewed the patient's chart and labs.  Questions were answered to the patient's satisfaction.     Deborah [name]

## 2023-04-28 NOTE — Consult Note (Signed)
[facility] Psychiatric Consult Initial  Patient Name: .Deborah [name]  MRN: [id number]  DOB: Dec 29, 1965  Consult Order details:  Orders (From admission, onward)     Start     Ordered   04/27/23 1541  IP CONSULT TO PSYCHIATRY       Ordering Provider: Alba Cory, MD  Provider:  (Not yet assigned)  Question Answer Comment  [facility]   Reason for Consult? depression, suicidal thought      04/27/23 1540            Mode of Visit: In person    Psychiatry Consult Evaluation  Service Date: April 28, 2023 LOS:  LOS: 3 days  Chief Complaint suicidal thought / [name]  Primary Psychiatric Diagnoses  MDD with psychotic features  GAD 3.  History of PTSD   Assessment  Deborah [name] is a 58 y.o. female admitted: Medicallyfor 04/25/2023 12:50 PM for fluid overload after missing HD for 2 weeks. She carries the psychiatric diagnoses of MDD, GAD, PTSD and has a past medical history of  fibromyalgia, gout, CHF, Afib, CVA, DM, ESRD on HD, PAD s/p R BKA, MAFLD with possible cirrhosis, GERD, duodenal carcinoid tumor s/p resection, gastroparesis, chronic anemia.   Her current presentation of depressed mood, hopelessness, increased guilt, decreased sleep, decreased energy with voices that tell her it's time to go is most consistent with MDD with psychotic features. Current outpatient psychotropic medications include buspar, atarax PRN, remeron and historically she has had a fair response to these medications. She was compliant with medications prior to admission as evidenced by patient report. On initial examination, patient appears depressed is reporting passive suicidal thoughts as recently as yesterday and reports recent history of suicide attempt a month ago. At this time will start risperdal for her psychotic features and will recommend voluntary inpatient psychiatric hospitalization for now. She currently contracts for safety while in the  hospital. Please see plan below for detailed recommendations.   Diagnoses:  Active Hospital problems: Principal Problem:   Volume overload Active Problems:   Insulin dependent type 2 diabetes mellitus (HCC)   Chronic hypokalemia   Chronic gout   Gastroparesis   GERD (gastroesophageal reflux disease)   Chronic depression   Chronic diarrhea   NASH (nonalcoholic steatohepatitis)   Chronic pain syndrome   Paroxysmal atrial fibrillation (HCC)   ESRD on dialysis (HCC)   Anemia of chronic disease   Hx of BKA, right (HCC)   Chronic diastolic CHF (congestive heart failure) (HCC)   Carcinoid tumor of abdomen   PVD (peripheral vascular disease) (HCC)   History of GI bleed   History of arteriovenous malformation (AVM)   Generalized anxiety disorder   History of reactive airway disease   History of spinal stenosis   Chronic diabetic ulcer of left foot determined by examination (HCC)   Dysphagia    Plan   ## Psychiatric Medication Recommendations:  --start risperdal 1mg  at bedtime for auditory hallucinations, mood stability --continue remeron 15mg  at bedtime for insomnia, depression --continue buspar 5mg  TID for anxiety  --continue hydroxyzine 25 TID PRN for anxiety  --holding off on restarting cymbalta at this time due to renal fxn  ## Medical Decision Making Capacity: Not specifically addressed in this encounter  ## Further Work-up:  -- per primary, consider TSH -- most recent EKG on 2/17 had QtC of 482 -- Pertinent labwork reviewed earlier this admission includes: vitamin B12, folate, iron, ferritin, RFP   ## Disposition:-- We recommend inpatient  psychiatric hospitalization when medically cleared. Patient is under voluntary admission status at this time; please IVC if attempts to leave hospital.  ## Behavioral / Environmental: -Utilize compassion and acknowledge the patient's experiences while setting clear and realistic expectations for care.    ## Safety and Observation  Level:  - Based on my clinical evaluation, I estimate the patient to be at low risk of self harm in the current setting. - At this time, we recommend  routine. This decision is based on my review of the chart including patient's history and current presentation, interview of the patient, mental status examination, and consideration of suicide risk including evaluating suicidal ideation, plan, intent, suicidal or self-harm behaviors, risk factors, and protective factors. This judgment is based on our ability to directly address suicide risk, implement suicide prevention strategies, and develop a safety plan while the patient is in the clinical setting. Please contact our team if there is a concern that risk level has changed.  CSSR Risk Category:C-SSRS RISK CATEGORY: No Risk  Suicide Risk Assessment: Patient has following modifiable risk factors for suicide: under treated depression  and lack of access to outpatient mental health resources, which we are addressing by optimizing medications, recommending inpatient. Patient has following non-modifiable or demographic risk factors for suicide: history of suicide attempt Patient has the following protective factors against suicide: Supportive friends  Thank you for this consult request. Recommendations have been communicated to the primary team.  We will continue to follow at this time.   Karie Fetch, MD, PGY-2       History of Present Illness  Relevant Aspects of [facility] Course:  Admitted on 04/25/2023 for fluid overload after missing HD for 2 weeks.  -2/20 EGD   Patient Report:  Patient is seen lying in bed, friend Loraine Leriche at bedside. Patient reports increased depressed mood, hopelessness, guilt, decreased energy, poor sleep, decreased appetite for the last 2 months since her medical problems including her AKA and learning that she would be on dialysis permanently. Reports guilt with depending on others when she was previously the one  that others would depend on. Reports a suicide attempt last month via overdose and reports she only became more sedated and then told family members. Reports she was not brought to the ED and did not get connected with outpatient psychiatry. Reports another stressor is not having as much support from her husband since 3 weeks ago, reports her husband stopped helping her out physically. Her main stressors are the lack of support from her husband, losing a caregiver 2 weeks ago and feeling like a burden to others due to all of her medical issues. She also reports increased anxiety due to her medical issues. Denies any current SI/HI/AVH. Reports her last passive SI was yesterday - "just getting out of here." Reports she would not attempt anything while in the hospital because she doesn't have access to anything, has no hx of SIB, contracts for safety. Discussed will start additional medication for her mood stability.   Psych ROS:  Depression: reports increased depressed mood, hopelessness, guilt, decreased sleep, decreased energy Anxiety:  reports increased anxiety Mania (lifetime and current): denies Psychosis: (lifetime and current): reports hearing voices telling her it's time to go, denies history of AVH  Collateral information:  Patterson Hammersmith at bedside was present for the interview. He confirms the past suicide attempt. He feels like the patient has felt more depressed due to her medical issues. Denies access to guns.   Review of Systems  Constitutional:  Positive for malaise/fatigue.  Musculoskeletal:  Positive for myalgias.  Psychiatric/Behavioral:  Positive for depression and suicidal ideas. The patient is nervous/anxious and has insomnia.      Psychiatric and Social History  Psychiatric History:  Information collected from patient, chart review  Prev Dx/Sx: MDD, GAD, PTSD Current Psych Provider: none Home Meds (current): remeron, buspar, atarax PRN Previous Med Trials: cymbalta   Therapy: none  Prior Psych Hospitalization: none  Prior Self Harm: reports suicide attempt 1 month ago Prior Violence: denies   Family Psych History: reports niece with bipolar Family Hx suicide: denies  Social History:  Living Situation: was living at home with husband, mom would come over to help  Access to weapons/lethal means: denies   Substance History Reports was using THC daily due to pain for the past month. Reports she was using this since she wasn't able to establish with a pain clinic and obtain dilaudid.   Exam Findings  Physical Exam:  Vital Signs:  Temp:  [97.3 F (36.3 C)-98.3 F (36.8 C)] 97.3 F (36.3 C) (02/20 1447) Pulse Rate:  [83-105] 96 (02/20 1530) Resp:  [18-28] 22 (02/20 1530) BP: (70-136)/(42-90) 119/75 (02/20 1530) SpO2:  [97 %-100 %] 100 % (02/20 1530) Weight:  [101.5 kg] 101.5 kg (02/20 1447) Blood pressure 119/75, pulse 96, temperature (!) 97.3 F (36.3 C), resp. rate (!) 22, weight 101.5 kg, last menstrual period 10/10/2012, SpO2 100%. Body mass index is 36.12 kg/m.  Physical Exam Constitutional:      Appearance: She is overweight.     Comments: Chronically ill appearing  Pulmonary:     Effort: Pulmonary effort is normal.  Psychiatric:        Mood and Affect: Mood is depressed.    Mental Status Exam: General Appearance: Casual  Orientation:  Full (Time, Place, and Person)  Memory:  Immediate;   Fair  Concentration:  Concentration: Fair  Recall:  Fair  Attention  Fair  Eye Contact:  Good  Speech:  Clear and Coherent  Language:  Fair  Volume:  Normal  Mood: Depressed  Affect:  Labile  Thought Process:  Goal Directed  Thought Content:  Rumination  Suicidal Thoughts:   Passive SI, last yesterday. None currently  Homicidal Thoughts:  No  Judgement:  Poor  Insight:  Shallow  Psychomotor Activity:  Normal  Akathisia:  No  Fund of Knowledge:  Fair      Assets:  Manufacturing systems engineer Desire for Improvement Housing Social  Support  Cognition:  WNL  ADL's:  Impaired  AIMS (if indicated):        Other History   These have been pulled in through the EMR, reviewed, and updated if appropriate.  Family History:  The patient's family history includes Congestive Heart Failure (age of onset: 10) in her sister; Diabetes in her brother, father, mother, and sister; Heart disease in her father.  Medical History: Past Medical History:  Diagnosis Date   Acute back pain with sciatica, left    Acute back pain with sciatica, right    Acute encephalopathy 05/29/2022   Acute osteomyelitis of right calcaneus (HCC) 12/21/2022   Anemia, unspecified    Atrial fibrillation with RVR (HCC)-resolved 09/09/2022   Atypical chest pain 09/10/2021   Cancer (HCC)    Carcinoid tumor of duodenum    Chest pain with normal coronary angiography 2019   Chronic a-fib (HCC) 09/09/2022   Chronic pain    Chronic systolic CHF (congestive heart failure) (HCC)    Dehiscence  of amputation stump of right lower extremity (HCC) 01/29/2023   Diabetes mellitus    DKA (diabetic ketoacidosis) (HCC)    Drug-seeking behavior    21 hospitalizations and 14 CT a/p in 2 years for N/V and abdominal pain, demanding only IV dilaudid   Elevated troponin    chronic   Esophageal reflux    Fibromyalgia    Gastric ulcer    Gastroparesis    Gout    HCAP (healthcare-associated pneumonia) 06/19/2022   Hyperlipidemia    Hyperosmolar hyperglycemic state (HHS) (HCC) 05/11/2022   Hyperosmolar non-ketotic state due to type 2 diabetes mellitus (HCC) 05/11/2022   Hypertension    Hypomagnesemia    Lumbosacral stenosis    LVH (left ventricular hypertrophy)    Morbid obesity (HCC)    Nausea & vomiting 09/09/2022   NICM (nonischemic cardiomyopathy) (HCC)    PAF (paroxysmal atrial fibrillation) (HCC)    Sepsis (HCC) 11/23/2022   Stroke (HCC) 02/2011   Symptomatic anemia 12/14/2022   Thrombocytosis    Vitamin B12 deficiency anemia     Surgical History: Past  Surgical History:  Procedure Laterality Date   ABDOMINAL AORTOGRAM W/LOWER EXTREMITY N/A 11/29/2022   Procedure: ABDOMINAL AORTOGRAM W/LOWER EXTREMITY;  Surgeon: Daria Pastures, MD;  Location: [facility];  Service: Cardiovascular;  Laterality: N/A;   AMPUTATION Right 12/29/2022   Procedure: RIGHT BELOW KNEE AMPUTATION;  Surgeon: Nadara Mustard, MD;  Location: [facility] OR;  Service: Orthopedics;  Laterality: Right;   AV FISTULA PLACEMENT Left 06/30/2022   Procedure: LEFT BRACHIOCEPHALIC ARTERIOVENOUS (AV) FISTULA CREATION;  Surgeon: Cephus Shelling, MD;  Location: [facility] OR;  Service: Vascular;  Laterality: Left;   BIOPSY  07/27/2019   Procedure: BIOPSY;  Surgeon: Vida Rigger, MD;  Location: [facility] ENDOSCOPY;  Service: Endoscopy;;   BIOPSY  07/30/2019   Procedure: BIOPSY;  Surgeon: Kathi Der, MD;  Location: [facility] ENDOSCOPY;  Service: Gastroenterology;;   CATARACT EXTRACTION  01/2014   CHOLECYSTECTOMY     COLONOSCOPY WITH PROPOFOL N/A 07/30/2019   Procedure: COLONOSCOPY WITH PROPOFOL;  Surgeon: Kathi Der, MD;  Location: [facility] ENDOSCOPY;  Service: Gastroenterology;  Laterality: N/A;   ESOPHAGOGASTRODUODENOSCOPY N/A 07/27/2019   Procedure: ESOPHAGOGASTRODUODENOSCOPY (EGD);  Surgeon: Vida Rigger, MD;  Location: [facility];  Service: Endoscopy;  Laterality: N/A;   ESOPHAGOGASTRODUODENOSCOPY N/A 07/26/2020   Procedure: ESOPHAGOGASTRODUODENOSCOPY (EGD);  Surgeon: Willis Modena, MD;  Location: [facility];  Service: Endoscopy;  Laterality: N/A;   ESOPHAGOGASTRODUODENOSCOPY (EGD) WITH PROPOFOL N/A 08/02/2019   Procedure: ESOPHAGOGASTRODUODENOSCOPY (EGD) WITH PROPOFOL;  Surgeon: Kathi Der, MD;  Location: [facility];  Service: Gastroenterology;  Laterality: N/A;   ESOPHAGOGASTRODUODENOSCOPY (EGD) WITH PROPOFOL N/A 12/23/2022   Procedure: ESOPHAGOGASTRODUODENOSCOPY (EGD) WITH PROPOFOL;  Surgeon: Shellia Cleverly, DO;  Location: [facility];  Service: Gastroenterology;  Laterality: N/A;    FISTULA SUPERFICIALIZATION Left 12/31/2022   Procedure: LEFT ARM FISTULA TRANSPOSITION;  Surgeon: Maeola Harman, MD;  Location: [facility] OR;  Service: Vascular;  Laterality: Left;   GIVENS CAPSULE STUDY N/A 12/23/2022   Procedure: GIVENS CAPSULE STUDY;  Surgeon: Shellia Cleverly, DO;  Location: [facility] ENDOSCOPY;  Service: Gastroenterology;  Laterality: N/A;   HEMOSTASIS CLIP PLACEMENT  08/02/2019   Procedure: HEMOSTASIS CLIP PLACEMENT;  Surgeon: Kathi Der, MD;  Location: [facility];  Service: Gastroenterology;;   HOT HEMOSTASIS N/A 12/23/2022   Procedure: HOT HEMOSTASIS (ARGON PLASMA COAGULATION/BICAP);  Surgeon: Shellia Cleverly, DO;  Location: [facility] ENDOSCOPY;  Service: Gastroenterology;  Laterality: N/A;   IR FLUORO GUIDE CV LINE RIGHT  06/24/2022   IR US GUIDE VASC ACCESS RIGHT  06/24/2022   POLYPECTOMY  07/30/2019   Procedure: POLYPECTOMY;  Surgeon: Kathi Der, MD;  Location: [facility];  Service: Gastroenterology;;   POLYPECTOMY  08/02/2019   Procedure: POLYPECTOMY;  Surgeon: Kathi Der, MD;  Location: [facility];  Service: Gastroenterology;;   STUMP REVISION Right 02/02/2023   Procedure: REVISION RIGHT BELOW KNEE AMPUTATION;  Surgeon: Nadara Mustard, MD;  Location: [facility] OR;  Service: Orthopedics;  Laterality: Right;     Medications:   Current Facility-Administered Medications:    (feeding supplement) PROSource Plus liquid 30 mL, 30 mL, Oral, BID BM, Sundil, Subrina, MD   atorvastatin (LIPITOR) tablet 10 mg, 10 mg, Oral, QPM, Sundil, Subrina, MD   busPIRone (BUSPAR) tablet 5 mg, 5 mg, Oral, TID, Sundil, Subrina, MD, 5 mg at 04/25/23 2035   famotidine (PEPCID) IVPB 20 mg premix, 20 mg, Intravenous, QAC breakfast, [name], Belkys A, MD, Last Rate: 100 mL/hr at 04/28/23 0846, 20 mg at 04/28/23 0846   ferric citrate (AURYXIA) tablet 210 mg, 210 mg, Oral, TID WC, Sundil, Subrina, MD   gabapentin (NEURONTIN) capsule 100 mg, 100 mg, Oral, TID, Sundil, Subrina,  MD   Gerhardt's butt cream, , Topical, BID, Almon Hercules, MD, Given at 04/28/23 0302   heparin sodium (porcine) injection 3,800 Units, 3,800 Units, Intracatheter, Continuous PRN, Delano Metz, MD, 3,800 Units at 04/26/23 1151   HYDROmorphone (DILAUDID) injection 0.5 mg, 0.5 mg, Intravenous, Q6H PRN, [name], Belkys A, MD   HYDROmorphone (DILAUDID) tablet 2 mg, 2 mg, Oral, Q6H PRN, Candelaria Stagers T, MD, 2 mg at 04/26/23 1854   hydrOXYzine (ATARAX) tablet 25 mg, 25 mg, Oral, TID PRN, Janalyn Shy, Subrina, MD, 25 mg at 04/28/23 1425   leptospermum manuka honey (MEDIHONEY) paste 1 Application, 1 Application, Topical, Daily, Alanda Slim, Taye T, MD, 1 Application at 04/28/23 1351   levalbuterol (XOPENEX) nebulizer solution 0.63 mg, 0.63 mg, Nebulization, Q6H PRN, Janalyn Shy, Subrina, MD   liver oil-zinc oxide (DESITIN) 40 % ointment, , Topical, Q8H PRN, Sundil, Subrina, MD   loperamide (IMODIUM) capsule 2 mg, 2 mg, Oral, PRN, Janalyn Shy, Subrina, MD, 2 mg at 04/28/23 0104   magic mouthwash w/lidocaine, 5 mL, Oral, QID, Alanda Slim, Taye T, MD   methocarbamol (ROBAXIN) tablet 500 mg, 500 mg, Oral, Q6H PRN, Janalyn Shy, Subrina, MD, 500 mg at 04/28/23 0105   metoprolol tartrate (LOPRESSOR) tablet 25 mg, 25 mg, Oral, BID, Sundil, Subrina, MD   midodrine (PROAMATINE) tablet 5 mg, 5 mg, Oral, TID WC, Sundil, Subrina, MD, 5 mg at 04/28/23 1610   mirtazapine (REMERON) tablet 15 mg, 15 mg, Oral, QHS, Sundil, Subrina, MD   ondansetron (ZOFRAN) tablet 4 mg, 4 mg, Oral, Q6H PRN **OR** ondansetron (ZOFRAN) injection 4 mg, 4 mg, Intravenous, Q6H PRN, Janalyn Shy, Subrina, MD, 4 mg at 04/28/23 0105   pantoprazole (PROTONIX) injection 40 mg, 40 mg, Intravenous, Q12H, [name], Belkys A, MD, 40 mg at 04/28/23 0846   risperiDONE (RISPERDAL) tablet 1 mg, 1 mg, Oral, QHS, Karie Fetch, MD   sodium bicarbonate tablet 650 mg, 650 mg, Oral, BID, Sundil, Subrina, MD   sodium chloride flush (NS) 0.9 % injection 3 mL, 3 mL, Intravenous, Q12H, Sundil,  Subrina, MD, 3 mL at 04/28/23 0846   sodium chloride flush (NS) 0.9 % injection 3 mL, 3 mL, Intravenous, Q12H, Sundil, Subrina, MD, 3 mL at 04/28/23 0846   sodium chloride flush (NS) 0.9 % injection 3 mL, 3 mL, Intravenous, PRN, Janalyn Shy, Subrina, MD   torsemide (  DEMADEX) tablet 100 mg, 100 mg, Oral, Once per day on Sunday Monday Wednesday Friday, Pola Corn, NP  Allergies: Allergies  Allergen Reactions   Gabapentin Hives and [name]   Isovue [Iopamidol] Anaphylaxis, [name] and Other (See Comments)    11/28/17 Patient had seizure like activity and then 1 min code after 100 cc of isovue 300. Possible contrast allergy vs vasovagal episode  Cardiac Arrest   Nsaids Anaphylaxis and Other (See Comments)    Hx of stomach ulcers   Penicillins Itching, Palpitations and Other (See Comments)    Flushing (Red Skin) Laryngeal Edema   Reglan [Metoclopramide] Other (See Comments)    Tardive dyskinesia    Valium [Diazepam] [name]   Zestril [Lisinopril] Anaphylaxis and Swelling    Tongue and mouth swelling Laryngeal Edema   Tolectin [Tolmetin] Nausea And Vomiting, Nausea Only and Other (See Comments)    Irritates stomach ulcer   Asa [Aspirin] Other (See Comments)    Hx of stomach ulcer   Aspartame And Phenylalanine Hives   Bentyl [Dicyclomine] Other (See Comments)    Chest pain   Hibiclens [Chlorhexidine Gluconate] Other (See Comments)    Dermatitis    Flexeril [Cyclobenzaprine] Palpitations   Oxycontin [Oxycodone] Palpitations   Rifamycins Palpitations   Tylenol [Acetaminophen] Nausea And Vomiting, Nausea Only and Other (See Comments)    Irritates stomach ulcer Abdominal pain   Ultram [Tramadol] Nausea And Vomiting and Palpitations    Karie Fetch, MD

## 2023-04-28 NOTE — Op Note (Addendum)
[facility] Patient Name: Deborah [name] Procedure Date : 04/28/2023 MRN: [id number] Attending MD: Particia Lather , , [id number] Date of Birth: Aug 24, 1965 [name]: [id number] Age: 58 Admit Type: Inpatient Procedure:                Upper GI endoscopy Indications:              Generalized abdominal pain, Dysphagia, Follow-up of                            malignant carcinoid tumor of the duodenum, Nausea                            with vomiting Providers:                Madelyn Brunner" Golden Hurter, RN, Kandice Robinsons, Technician Referring MD:             Hospitalist team Medicines:                Monitored Anesthesia Care Complications:            No immediate complications. Estimated Blood Loss:     Estimated blood loss was minimal. Procedure:                Pre-Anesthesia Assessment:                           - Prior to the procedure, a History and Physical                            was performed, and patient medications and                            allergies were reviewed. The patient's tolerance of                            previous anesthesia was also reviewed. The risks                            and benefits of the procedure and the sedation                            options and risks were discussed with the patient.                            All questions were answered, and informed consent                            was obtained. Prior Anticoagulants: The patient has                            taken no anticoagulant or antiplatelet agents. ASA  Grade Assessment: III - A patient with severe                            systemic disease. After reviewing the risks and                            benefits, the patient was deemed in satisfactory                            condition to undergo the procedure.                           After obtaining informed consent, the endoscope was                             passed under direct vision. Throughout the                            procedure, the patient's blood pressure, pulse, and                            oxygen saturations were monitored continuously. The                            [id number]-H190 ([id number]) Olympus endoscope was introduced                            through the mouth, and advanced to the second part                            of duodenum. The upper GI endoscopy was                            accomplished without difficulty. The patient                            tolerated the procedure well. Scope In: Scope Out: Findings:      The examined esophagus was normal. A guidewire was placed and the scope       was withdrawn. Dilation was performed with a Savary dilator with mild       resistance at 19 mm. Biopsies were taken with a cold forceps for       histology.      Retained fluid was found in the gastric body.      Localized inflammation characterized by congestion (edema) and erythema       was found in the gastric body and in the gastric antrum. Biopsies were       taken with a cold forceps for histology.      Localized nodular mucosa was found in the duodenal bulb. Biopsies were       taken with a cold forceps for histology. Impression:               - Normal esophagus. Dilated. Biopsied.                           -  Retained gastric fluid.                           - Gastritis. Biopsied.                           - Nodular mucosa in the duodenal bulb. Biopsied. Recommendation:           - Return patient to hospital ward for ongoing care.                           - Await pathology results.                           - Patient describes her pain as being throughout                            her whole body.                           - Patient is not able to take Reglan (side effect                            of tardive dyskinesia) or erythromycin (patient                            states that she developed an elevated  HR).                           - Would have her try to follow a gastroparesis                            diet. Wean narcotics as tolerated, which will make                            gastroparesis worse.                           - Patient can follow up with [facility] where                            she was seen previously.                           - The findings and recommendations were discussed                            with the patient. Procedure Code(s):        --- Professional ---                           [phone], Esophagogastroduodenoscopy, flexible,                            transoral; with insertion of guide wire followed by  passage of dilator(s) through esophagus over guide                            wire                           [address]39, [birth year], Esophagogastroduodenoscopy, flexible,                            transoral; with biopsy, single or multiple Diagnosis Code(s):        --- Professional ---                           K29.70, Gastritis, unspecified, without bleeding                           K31.89, Other diseases of stomach and duodenum                           R10.84, Generalized abdominal pain                           R13.10, Dysphagia, unspecified                           C7A.010, Malignant carcinoid tumor of the duodenum                           R11.2, Nausea with vomiting, unspecified CPT copyright 2022 American Medical Association. All rights reserved. The codes documented in this report are preliminary and upon coder review may  be revised to meet current compliance requirements. Dr Particia Lather [phone]" Ali Chukson,  04/28/2023 12:11:22 PM Number of Addenda: 0

## 2023-04-28 NOTE — Transfer of Care (Signed)
Immediate Anesthesia Transfer of Care Note  Patient: Deborah [name]  Procedure(s) Performed: ESOPHAGOGASTRODUODENOSCOPY (EGD) WITH PROPOFOL SAVORY DILATION BIOPSY  Patient Location: PACU  Anesthesia Type:MAC  Level of Consciousness: awake and alert   Airway & Oxygen Therapy: Patient Spontanous Breathing and Patient connected to face mask oxygen  Post-op Assessment: Report given to RN and Post -op Vital signs reviewed and stable  Post vital signs: Reviewed and stable  Last Vitals:  Vitals Value Taken Time  BP 103/42 04/28/23 1203  Temp 36.6 C 04/28/23 1203  Pulse 78 04/28/23 1205  Resp 29 04/28/23 1205  SpO2 100 % 04/28/23 1205  Vitals shown include unfiled device data.  Last Pain:  Vitals:   04/28/23 1203  TempSrc: Temporal  PainSc: 10-Worst pain ever         Complications: No notable events documented.

## 2023-04-28 NOTE — Progress Notes (Addendum)
Deborah [name] Progress Note   Subjective: Seen in room. High PO4 noted, changed back to renal/carb mod diet.   Objective Vitals:   04/28/23 1226 04/28/23 1230 04/28/23 1234 04/28/23 1303  BP: 106/77 105/66  (!) 120/90  Pulse: (!) 105 96 97 100  Resp: 20 (!) 22 19   Temp:      TempSrc:      SpO2: 100% 97% 100%   Weight:       Physical Exam General: Chronically ill appearing female in NAD Heart: S1,S2 No M/R/G SR Lungs: CTAB  Abdomen: Obese NABs Extremities: R BKA 2+ LLE edema Drsg L foot Dialysis Access: [facility] drsg intact  Additional Objective Labs: Basic Metabolic Panel: Recent Labs  Lab 04/26/23 0618 04/26/23 0619 04/28/23 0510  NA 136 135 139  K 3.1* 3.0* 2.9*  CL 104 105 103  CO2 17* 17* 21*  GLUCOSE 81 90 96  BUN 50* 48* 27*  CREATININE 8.66* 8.88* 6.80*  CALCIUM 8.4* 8.5* 8.9  PHOS 10.7*  --  7.0*   Liver Function Tests: Recent Labs  Lab 04/25/23 1409 04/26/23 0618 04/26/23 0619 04/28/23 0510  AST 9*  --  9*  --   ALT 8  --  7  --   ALKPHOS 80  --  78  --   BILITOT 0.4  --  0.5  --   PROT 6.5  --  6.1*  --   ALBUMIN 2.3* 2.2* 2.1* 2.5*   Recent Labs  Lab 04/25/23 1409  LIPASE 24   CBC: Recent Labs  Lab 04/25/23 1409 04/25/23 1624 04/26/23 0619 04/28/23 0457  WBC 12.6*  --  12.9* 10.1  HGB 8.2* 9.2* 8.2* 7.9*  HCT 27.2* 27.0* 25.9* 25.6*  MCV 95.4  --  94.5 96.2  PLT 288  --  273 266   Blood Culture    Component Value Date/Time   SDES BLOOD BLOOD RIGHT HAND 02/17/2023 2243   SPECREQUEST  02/17/2023 2243    BOTTLES DRAWN AEROBIC AND ANAEROBIC Blood Culture results may not be optimal due to an inadequate volume of blood received in culture bottles   CULT  02/17/2023 2243    NO GROWTH 5 DAYS Performed at [facility], [address], [place], [place] [address]    REPTSTATUS 02/22/2023 FINAL 02/17/2023 2243    Cardiac Enzymes: No results for input(s): "CKTOTAL", "CKMB", "CKMBINDEX", "TROPONINI" in the last 168  hours. CBG: Recent Labs  Lab 04/27/23 0621 04/27/23 1136 04/27/23 1631 04/27/23 2046 04/28/23 0624  GLUCAP 88 81 92 96 83   Iron Studies:  Recent Labs    04/28/23 0510  IRON 61  TIBC 147*  FERRITIN 1,349*   @lablastinr3 @ Studies/Results: No results found. Medications:  famotidine (PEPCID) IV 20 mg (04/28/23 0846)   heparin sodium (porcine)      (feeding supplement) PROSource Plus  30 mL Oral BID BM   atorvastatin  10 mg Oral QPM   busPIRone  5 mg Oral TID   ferric citrate  210 mg Oral TID WC   gabapentin  100 mg Oral TID   Gerhardt's butt cream   Topical BID   leptospermum manuka honey  1 Application Topical Daily   magic mouthwash w/lidocaine  5 mL Oral QID   metoprolol tartrate  25 mg Oral BID   midodrine  5 mg Oral TID WC   mirtazapine  15 mg Oral QHS   pantoprazole (PROTONIX) IV  40 mg Intravenous Q12H   sodium bicarbonate  650 mg Oral BID   sodium chloride flush  3 mL Intravenous Q12H   sodium chloride flush  3 mL Intravenous Q12H   torsemide  100 mg Oral Once per day on Sunday Monday Wednesday Friday     HD orders: [facility] T,TH,S 4 hr 180NRe 400/800 112 kg 3.0 K/2.5 Ca TDC - No Heparin  - Hectorol 5 mcg IV three times per week - Mircera 150 mcg IV q 2 weeks (last dose 04/09/2023)     Assessment/ Plan: Volume overload - d/t missing HD which were transportation issues per the pt. Mostly LE edema. Max UF w/ HD.  ESRD - on HD TTS. Next HD 04/28/2023 Hypokalemia-Follow labs. use 3.0 K bath. Supplement oral K+ if needed.  Hypotension - chronic hypotension it appears. BP here is soft to low-normal. On midodrine 5 tid, takes BB for afib most likely.  Volume - By bed scales, now under OP EDW. Lungs clear, chronic LE edema. Lower EDW if possible on DD. Continue Torsemide on non HD days Anemia of esrd - Hb 8- 9 here. ESA was due 04/23/2023, Given Darbe 200 mcg sQ 04/26/2023. Follow HGB. Transfuse prn.  Secondary hyperparathyroidism - CCa in range, add on phos.  Resume binders w/ meals.  Atrial fib - chronic. No anticoagulation.   Deborah [name] H. Treshun Wold NP-C 04/28/2023, 1:22 PM  [employer] [phone]

## 2023-04-28 NOTE — Anesthesia Postprocedure Evaluation (Signed)
Anesthesia Post Note  Patient: Deborah [name]  Procedure(s) Performed: ESOPHAGOGASTRODUODENOSCOPY (EGD) WITH PROPOFOL SAVORY DILATION BIOPSY     Patient location during evaluation: PACU Anesthesia Type: MAC Level of consciousness: awake and alert Pain management: pain level controlled Vital Signs Assessment: post-procedure vital signs reviewed and stable Respiratory status: spontaneous breathing and respiratory function stable Cardiovascular status: blood pressure returned to baseline and stable Postop Assessment: spinal receding Anesthetic complications: no  No notable events documented.  Last Vitals:  Vitals:   04/28/23 1509 04/28/23 1530  BP: 107/81 119/75  Pulse: 95 96  Resp: (!) 23 (!) 22  Temp:    SpO2: 100% 100%    Last Pain:  Vitals:   04/28/23 1440  TempSrc:   PainSc: 10-Worst pain ever   Pain Goal:                   Trevor Iha

## 2023-04-29 DIAGNOSIS — R52 Pain, unspecified: Secondary | ICD-10-CM | POA: Diagnosis not present

## 2023-04-29 LAB — CBC
HCT: 24.3 % — ABNORMAL LOW (ref 36.0–46.0)
Hemoglobin: 7.5 g/dL — ABNORMAL LOW (ref 12.0–15.0)
MCH: 29.3 pg (ref 26.0–34.0)
MCHC: 30.9 g/dL (ref 30.0–36.0)
MCV: 94.9 fL (ref 80.0–100.0)
Platelets: 304 10*3/uL (ref 150–400)
RBC: 2.56 MIL/uL — ABNORMAL LOW (ref 3.87–5.11)
RDW: 14.9 % (ref 11.5–15.5)
WBC: 9.5 10*3/uL (ref 4.0–10.5)
nRBC: 0 % (ref 0.0–0.2)

## 2023-04-29 LAB — BASIC METABOLIC PANEL
Anion gap: 11 (ref 5–15)
BUN: 12 mg/dL (ref 6–20)
CO2: 25 mmol/L (ref 22–32)
Calcium: 8.5 mg/dL — ABNORMAL LOW (ref 8.9–10.3)
Chloride: 99 mmol/L (ref 98–111)
Creatinine, Ser: 4.7 mg/dL — ABNORMAL HIGH (ref 0.44–1.00)
GFR, Estimated: 10 mL/min — ABNORMAL LOW (ref >60)
Glucose, Bld: 93 mg/dL (ref 70–99)
Potassium: 3 mmol/L — ABNORMAL LOW (ref 3.5–5.1)
Sodium: 135 mmol/L (ref 135–145)

## 2023-04-29 LAB — GLUCOSE, CAPILLARY
Glucose-Capillary: 88 mg/dL (ref 70–99)
Glucose-Capillary: 85 mg/dL (ref 70–99)
Glucose-Capillary: 96 mg/dL (ref 70–99)
Glucose-Capillary: 124 mg/dL — ABNORMAL HIGH (ref 70–99)

## 2023-04-29 LAB — SURGICAL PATHOLOGY

## 2023-04-29 MED ORDER — POTASSIUM CHLORIDE 20 MEQ PO PACK: 20.0000 meq | PACK | Freq: Once | ORAL | Status: DC

## 2023-04-29 MED ORDER — POTASSIUM CHLORIDE 20 MEQ PO PACK: 40.0000 meq | PACK | Freq: Once | ORAL | Status: DC

## 2023-04-29 MED ORDER — SODIUM CHLORIDE 0.9% FLUSH
10.0000 mL | Freq: Two times a day (BID) | INTRAVENOUS | Status: DC
  Administered 2023-04-29 – 2023-05-03 (×9): 10 mL

## 2023-04-29 MED ORDER — POTASSIUM CHLORIDE 20 MEQ PO PACK
20.0000 meq | PACK | Freq: Once | ORAL | Status: AC
  Administered 2023-04-29: 20 meq via ORAL

## 2023-04-29 MED ORDER — ONDANSETRON HCL 4 MG/2ML IJ SOLN
4.0000 mg | Freq: Three times a day (TID) | INTRAMUSCULAR | Status: DC
  Administered 2023-04-29 – 2023-05-04 (×14): 4 mg via INTRAVENOUS
  Filled 2023-04-29 (×14): qty 2

## 2023-04-29 MED ORDER — SODIUM CHLORIDE 0.9% FLUSH: 10.0000 mL | INTRAVENOUS | Status: DC | PRN

## 2023-04-29 NOTE — Plan of Care (Signed)

## 2023-04-29 NOTE — Discharge Instructions (Signed)
 Outpatient Therapy and Psychiatry for Medicare Recipients  [facility] [address] [place], [place], [address] [phone] phone  [facility] [address] [place], [place], [address] [address] phone ([address] [employer] [address] - [place], [address], [place], [place], [employer], [address], [employer] Healthy [place], [place], [address], [place], [place], [place], [employer], [employer], [employer], [employer], [place])  [facility] [address] [place], [place], [address] [phone] phone  [facility] [address], [address] [place], [place], [address] [phone] phone  [facility] [address] [place], [place], [address] [phone] phone [phone] fax  [facility], [place] [address], [place], [address] [phone] phone  [facility] [address] [place], [place], [address] [phone] phone [phone] fax  [facility] [address] [place], [place], [address] [phone] phone  [facility] [address] [place], [place], [address] [phone] phone  [facility] [address] [place], [place], [address] [phone] phone [phone] fax

## 2023-04-29 NOTE — Progress Notes (Signed)
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  Also reports poor oral intake in the setting of nausea, vomiting and diarrhea for 2 days.  She lives at home by herself and endorses she does not have anyone to help her around.  She is not able to transfer using [facility] lift.  She was found to have potassium of 2.9, BUN 46, bicarb 19, troponin 23--- 16.  CT chest abdomen and pelvis without contrast no acute chest abdomen and pelvis abnormality.  Scattered coronary artery disease, scattered groundglass opacity of the lung favor atelectasis.  Fibroid uterus.  Nephrology was consulted patient was admitted for hemodialysis and further care.    Assessment & Plan:   Principal Problem:   Volume overload Active Problems:   Chronic hypokalemia   Chronic diarrhea   ESRD on dialysis (HCC)   Anemia of chronic disease   Paroxysmal atrial fibrillation (HCC)   GERD (gastroesophageal reflux disease)   Insulin dependent type 2 diabetes mellitus (HCC)   Chronic gout   Gastroparesis   Chronic depression   NASH (nonalcoholic steatohepatitis)   Chronic pain syndrome   Hx of BKA, right (HCC)   Chronic diastolic CHF (congestive heart failure) (HCC)   Carcinoid tumor of abdomen   PVD (peripheral vascular disease) (HCC)   History of GI bleed   History of arteriovenous  malformation (AVM)   Generalized anxiety disorder   History of reactive airway disease   History of spinal stenosis   Chronic diabetic ulcer of left foot determined by examination (HCC)   Dysphagia   1-Volume overload secondary to missing hemodialysis Acute on chronic diastolic heart failure CT chest with scattered groundglass opacity ESRD on hemodialysis, noncompliance due to transportation issues Volume managed with hemodialysis.  Nephrology consulted had hemodialysis 12/18. Next HD 2/20. Stable   Chronic hypotension: Continue with midodrine  Chest pain/abdominal pain, nausea vomiting Reports that oral Dilaudid has not been helping with her pain.  She usually required few days of IV pain medication. CT abdomen and pelvis with no acute findings IV Protonix. Start weaning IV dilaudid.  She has a history of carcinoid tumor Underwent endoscopy 2/20: nodular mucosa duodenum, Normal esophagus dilated, retained gastric fluid.  Will schedule Zofran, report she can't swallow.  Nausea vomiting diarrhea: Suspect related to duodenal carcinoid tumor GI has been consulted  Dysphagia: Report inability to swallow pills and reports something got stuck GI has been consulted. Underwent endoscopy, esophagus normal, dilated.  Schedule zofran  Anemia of chronic disease secondary to ESRD: Stable. Monitor hb  IDDM-2: A1c 5.7% on 11/29.  Monitor  CBG     Paroxysmal atrial fibrillation: Not on anticoagulation due to prior GI bleed -Continue metoprolol 25 mg twice daily. -Optimize electrolytes.   GERD/history of GIB/AVM -Continue Protonix and pain present   Anxiety/depression/chronic pain syndrome: -Continue home meds-BuSpar, Atarax, gabapentin Psych consulted,reported suicidal thought to SW Patient have been clear for discharge to SNF per psych. Does not  need inpatient psychiatric facility admission Continue with Remeron and risperidone  Gastroparesis: Unfortunately allergic to Reglan.   Also on opiate which might not be a good choice.  Wean IV pain meds.   NASH (nonalcoholic steatohepatitis) -Continue to monitor hepatic function panel.   Right BKA/ambulatory dysfunction: Patient reports using [facility] lift for transfer.  Sitting on bedside during my evaluation. -PT/OT eval   Carcinoid tumor of abdomen/chronic diarrhea: Reports 2-3 BMs daily at baseline. -Continue loperamide.   PVD (peripheral vascular disease) (HCC) -Continue Lipitor   History of reactive airway disease: Stable. - continue Xopenex as needed    History of spinal stenosis with associated chronic pain syndrome -Continue gabapentin, Robaxin Dilaudid as needed   Chronic diabetic ulcer of left foot determined by examination (HCC) -Wound care consulted.  -Dr Lajoyce Corners will see patient tomorrow  Morbid obesity: Elevated BMI with comorbidity as above Body mass index is 38.04 kg/m.        Pressure Injury 11/23/22 Ischial tuberosity Left Unstageable - Full thickness tissue loss in which the base of the injury is covered by slough (yellow, tan, gray, green or brown) and/or eschar (tan, brown or black) in the wound bed. (Active)  11/23/22 0853  Location: Ischial tuberosity  Location Orientation: Left  Staging: Unstageable - Full thickness tissue loss in which the base of the injury is covered by slough (yellow, tan, gray, green or brown) and/or eschar (tan, brown or black) in the wound bed.  Wound Description (Comments):   Present on Admission: Yes     Pressure Injury 11/24/22 Ischial tuberosity Right Stage 2 -  Partial thickness loss of dermis presenting as a shallow open injury with a red, pink wound bed without slough. stage two injury to upper thigh into groin region. (Active)  11/24/22 1452  Location: Ischial tuberosity  Location Orientation: Right  Staging: Stage 2 -  Partial thickness loss of dermis presenting as a shallow open injury with a red, pink wound bed without slough.  Wound Description  (Comments): stage two injury to upper thigh into groin region.  Present on Admission: Yes                  Estimated body mass index is 35.48 kg/m as calculated from the following:   Height as of 03/22/23: 5\' 6"  (1.676 m).   Weight as of this encounter: 99.7 kg.   DVT prophylaxis: SCD Code Status: Full code Family Communication: Disposition Plan:  Status is: Inpatient Remains inpatient appropriate because: management of pain     Consultants:  GI Nephrology   Procedures:  HD   Antimicrobials:    Subjective She report unable to swallow pill or eat   Objective: Vitals:   04/29/23 0100 04/29/23 0125 04/29/23 0600 04/29/23 0800  BP: 94/62 97/74 105/60 107/61  Pulse: 89  100 91  Resp: 18  18 18   Temp:   97.8 F (36.6 C) 97.9 F (36.6 C)  TempSrc:   Oral Oral  SpO2: 100%  100% 100%  Weight:        Intake/Output Summary (Last 24 hours) at 04/29/2023 1307 Last data filed at 04/28/2023 2009 Gross per 24 hour  Intake 420 ml  Output 1800 ml  Net -1380 ml    Filed Weights   04/26/23 1230 04/28/23 1447 04/28/23 1813  Weight: 102.9 kg 101.5 kg 99.7 kg    Examination:  General exam: NAD Respiratory system: CTA Cardiovascular system: S 1, S 2 RRR Gastrointestinal system: BS present,soft,nt Central nervous system: alert  Data Reviewed: I have personally reviewed following labs and imaging studies  CBC: Recent Labs  Lab 04/25/23 1409 04/25/23 1624 04/26/23 0619 04/28/23 0457 04/29/23 0900  WBC 12.6*  --  12.9* 10.1 9.5  HGB 8.2* 9.2* 8.2* 7.9* 7.5*  HCT 27.2* 27.0* 25.9* 25.6* 24.3*  MCV 95.4  --  94.5 96.2 94.9  PLT 288  --  273 266 304   Basic Metabolic Panel: Recent Labs  Lab 04/25/23 1409 04/25/23 1624 04/26/23 0618 04/26/23 0619 04/28/23 0510 04/29/23 0900  NA 135 136 136 135 139 135  K 2.9* 2.8* 3.1* 3.0* 2.9* 3.0*  CL 105 107 104 105 103 99  CO2 17*  --  17* 17* 21* 25  GLUCOSE 131* 115* 81 90 96 93  BUN 46* 41* 50* 48* 27*  12  CREATININE 8.66* 9.50* 8.66* 8.88* 6.80* 4.70*  CALCIUM 8.2*  --  8.4* 8.5* 8.9 8.5*  MG  --   --   --   --  2.1  --   PHOS  --   --  10.7*  --  7.0*  --    GFR: Estimated Creatinine Clearance: 15.4 mL/min (A) (by C-G formula based on SCr of 4.7 mg/dL (H)). Liver Function Tests: Recent Labs  Lab 04/25/23 1409 04/26/23 0618 04/26/23 0619 04/28/23 0510  AST 9*  --  9*  --   ALT 8  --  7  --   ALKPHOS 80  --  78  --   BILITOT 0.4  --  0.5  --   PROT 6.5  --  6.1*  --   ALBUMIN 2.3* 2.2* 2.1* 2.5*   Recent Labs  Lab 04/25/23 1409  LIPASE 24   No results for input(s): "AMMONIA" in the last 168 hours. Coagulation Profile: No results for input(s): "INR", "PROTIME" in the last 168 hours. Cardiac Enzymes: No results for input(s): "CKTOTAL", "CKMB", "CKMBINDEX", "TROPONINI" in the last 168 hours. BNP (last 3 results) No results for input(s): "PROBNP" in the last 8760 hours. HbA1C: No results for input(s): "HGBA1C" in the last 72 hours. CBG: Recent Labs  Lab 04/28/23 0624 04/28/23 1423 04/28/23 2135 04/29/23 0811 04/29/23 1128  GLUCAP 83 97 117* 88 85   Lipid Profile: No results for input(s): "CHOL", "HDL", "LDLCALC", "TRIG", "CHOLHDL", "LDLDIRECT" in the last 72 hours. Thyroid Function Tests: No results for input(s): "TSH", "T4TOTAL", "FREET4", "T3FREE", "THYROIDAB" in the last 72 hours. Anemia Panel: Recent Labs    04/28/23 0510  VITAMINB12 392  FOLATE 8.2  FERRITIN 1,349*  TIBC 147*  IRON 61   Sepsis Labs: No results for input(s): "PROCALCITON", "LATICACIDVEN" in the last 168 hours.  No results found for this or any previous visit (from the past 240 hours).       Radiology Studies: No results found.       Scheduled Meds:  (feeding supplement) PROSource Plus  30 mL Oral BID BM   atorvastatin  10 mg Oral QPM   busPIRone  5 mg Oral TID   ferric citrate  210 mg Oral TID WC   gabapentin  100 mg Oral TID   Gerhardt's butt cream   Topical BID    leptospermum manuka honey  1 Application Topical Daily   magic mouthwash w/lidocaine  5 mL Oral QID   metoprolol tartrate  25 mg Oral BID   midodrine  5 mg Oral TID WC   midodrine  5 mg Oral Once   mirtazapine  15 mg Oral QHS  ondansetron (ZOFRAN) IV  4 mg Intravenous Q8H   pantoprazole (PROTONIX) IV  40 mg Intravenous Q12H   potassium chloride  20 mEq Per Tube Once   risperiDONE  1 mg Oral QHS   sodium bicarbonate  650 mg Oral BID   sodium chloride flush  3 mL Intravenous Q12H   sodium chloride flush  3 mL Intravenous Q12H   torsemide  100 mg Oral Once per day on Sunday Monday Wednesday Friday   Continuous Infusions:  famotidine (PEPCID) IV 20 mg (04/29/23 1101)   heparin sodium (porcine)       LOS: 4 days    Time spent: 35 minutes    Deborah Edgar A Layaan Mott, MD [facility]   If 7PM-7AM, please contact night-coverage [email or web address]  04/29/2023, 1:07 PM

## 2023-04-29 NOTE — Consult Note (Addendum)
 [facility] Psychiatric Consult Follow-up  Patient Name: .Deborah [name]  MRN: [id number]  DOB: 12-06-1965  Consult Order details:  Orders (From admission, onward)     Start     Ordered   04/27/23 1541  IP CONSULT TO PSYCHIATRY       Ordering Provider: Alba Cory, MD  Provider:  (Not yet assigned)  Question Answer Comment  Location [facility]   Reason for Consult? depression, suicidal thought      04/27/23 1540            Mode of Visit: In person    Psychiatry Consult Evaluation  Service Date: April 29, 2023 LOS:  LOS: 4 days  Chief Complaint suicidal thought / [name]  Primary Psychiatric Diagnoses  MDD with psychotic features, recurrent, moderate  GAD 3.  History of PTSD  4. Suspect borderline traits   Assessment  Deborah [name] is a 58 y.o. female admitted: Medicallyfor 04/25/2023 12:50 PM for fluid overload after missing HD for 2 weeks. She carries the psychiatric diagnoses of MDD, GAD, PTSD and has a past medical history of  fibromyalgia, gout, CHF, Afib, CVA, DM, ESRD on HD, PAD s/p R BKA, MAFLD with possible cirrhosis, GERD, duodenal carcinoid tumor s/p resection, gastroparesis, chronic anemia.   Her current presentation of depressed mood, hopelessness, increased guilt, decreased sleep, decreased energy with voices that tell her it's time to go is most consistent with MDD with psychotic features. Current outpatient psychotropic medications include buspar, atarax PRN, remeron and historically she has had a fair response to these medications. She was compliant with medications prior to admission as evidenced by patient report, non-compliant as evidenced by [facility]. On initial examination, patient appears depressed is reporting passive suicidal thoughts as recently as yesterday and reports recent history of suicide attempt a month ago. At this time will start risperdal for her psychotic features and will recommend voluntary inpatient  psychiatric hospitalization for now. She currently contracts for safety while in the hospital.   On follow-up examination, patient is initially irritable when this writer comes to her room. She reports continued pain and poor sleep, discussed with her that we had started risperdal to help with sleep and mood and she reports she declined all medications yesterday since she had the EGD and couldn't swallow. She asks for provider to come back later. On later assessment, when inpatient psychiatric hospitalization was discussed as an option with patient, she adamantly opposes this and states that she is not going to harm herself while in the hospital or at a SNF. She contracts for safety and has had no attempt to harm herself in the hospital even without a sitter yesterday. Reports no SI since the day before yesterday. Suspect patient may have some borderline traits given her labile affect. At this time do not believe that the patient is at acute risk of harm to self or others. Friend at bedside yesterday also mentioned that patient would likely benefit more from having coping skills discussed regarding her current medical state. Discussed our medication recommendations and also discussed that we could place recommendations for outpatient psychiatric follow-up which she agreed to. Agree with current dc recommendation to SNF. She requests that psychiatry no longer follow her while in the hospital. Discussed that she could likely benefit from therapy in the outpatient setting and would provide resources in her discharge instructions.   Please see plan below for detailed recommendations.   Diagnoses:  Active Hospital problems: Principal Problem:  Volume overload Active Problems:   Insulin dependent type 2 diabetes mellitus (HCC)   Chronic hypokalemia   Chronic gout   Gastroparesis   GERD (gastroesophageal reflux disease)   Chronic depression   Chronic diarrhea   NASH (nonalcoholic steatohepatitis)    Chronic pain syndrome   Paroxysmal atrial fibrillation (HCC)   ESRD on dialysis (HCC)   Anemia of chronic disease   Hx of BKA, right (HCC)   Chronic diastolic CHF (congestive heart failure) (HCC)   Carcinoid tumor of abdomen   PVD (peripheral vascular disease) (HCC)   History of GI bleed   History of arteriovenous malformation (AVM)   Generalized anxiety disorder   History of reactive airway disease   History of spinal stenosis   Chronic diabetic ulcer of left foot determined by examination (HCC)   Dysphagia    Plan   ## Psychiatric Medication Recommendations:  --continue risperdal 1mg  at bedtime for auditory hallucinations, mood stability --continue remeron 15mg  at bedtime for insomnia, depression --continue buspar 5mg  TID for anxiety  --continue hydroxyzine 25 TID PRN for anxiety  --holding off on restarting cymbalta at this time due to renal fxn  ## Medical Decision Making Capacity: Not specifically addressed in this encounter  ## Further Work-up:  -- per primary, consider TSH -- most recent EKG on 2/17 had QtC of 482 -- Pertinent labwork reviewed earlier this admission includes: vitamin B12, folate, iron, ferritin, RFP   ## Disposition:-- There are no psychiatric contraindications to discharge at this time -Outpatient psychiatric resources placed in dc instructions -Agree with patient discharge to SNF  ## Behavioral / Environmental: - Patients with borderline personality traits/disorder often use the language of physical pain to communicate both physical and emotional suffering. It is important to address pain complaints as they arise and attempt to identify an etiology, either organic or psychiatric. In patients with chronic pain, it is important to have a discussion with the patient about expectations about pain control., Patient would benefit from more frequent contact with medical team to delineate plan of care and allow for clarification questions, which will help  alleviate anxiety regarding treatment. If possible, try to check back in with the pt in the afternoon., To minimize splitting of staff, assign one staff person to communicate all information from the team when feasible., or Utilize compassion and acknowledge the patient's experiences while setting clear and realistic expectations for care.    ## Safety and Observation Level:  - Based on my clinical evaluation, I estimate the patient to be at low risk of self harm in the current setting. - At this time, we recommend  routine. This decision is based on my review of the chart including patient's history and current presentation, interview of the patient, mental status examination, and consideration of suicide risk including evaluating suicidal ideation, plan, intent, suicidal or self-harm behaviors, risk factors, and protective factors. This judgment is based on our ability to directly address suicide risk, implement suicide prevention strategies, and develop a safety plan while the patient is in the clinical setting. Please contact our team if there is a concern that risk level has changed.  CSSR Risk Category:C-SSRS RISK CATEGORY: No Risk  Suicide Risk Assessment: Patient has following modifiable risk factors for suicide: under treated depression  and lack of access to outpatient mental health resources, which we are addressing by optimizing medications, providing outpatient resources Patient has following non-modifiable or demographic risk factors for suicide: history of suicide attempt Patient has the following protective factors against  suicide: Supportive friends  Thank you for this consult request. Patient was discussed with attending Dr. Clovis Riley. Recommendations have been communicated to the primary team.  We will sign off at this time.   Karie Fetch, MD, PGY-2       History of Present Illness  Relevant Aspects of [facility] Course:  Admitted on 04/25/2023 for fluid overload  after missing HD for 2 weeks.  -2/20 EGD   ON events: VSS. EGD with normal esophagus. Patient refused meds due to inability to swallow pills.   Patient Report:  Patient is initially seen lying in bed, later on she is seen sitting on the side of her bed with her head on top of a stack of towels. When initially seen, patient was irritable and stated that she was in a bad mood because she was up all night hurting and had not gotten sleep. She asked for this provider to come back later. On later assessment, patient reported she did not take the meds due to concern about swallowing. I discussed with patient that the risperdal should help with her mood and sleep. She reports she will try tonight. When I discussed the option of inpatient psychiatric hospitalization with the patient, the patient begins to start adamantly denying that she will harm herself or others both in the hospital or in the nursing home. She in fact states that she did not even want to state the thoughts because "all you guys want to do is inpatient psychiatric facility and I don't need to go there."  Discussed the recommendation is for her safety and she adamantly denies that she has any active SI and she contracts for safety while in the hospital as well as stating she will be safe if she discharges to a psychiatric facility.   Psych ROS:  Depression: reports increased depressed mood, hopelessness, guilt, decreased sleep, decreased energy Anxiety:  reports increased anxiety Mania (lifetime and current): denies Psychosis: (lifetime and current): reports hearing voices telling her it's time to go, denies history of AVH  Collateral information:  Patterson Hammersmith at bedside was present for the interview. He confirms the past suicide attempt. He feels like the patient has felt more depressed due to her medical issues and would benefit from more coping skills but denies any acute safety concerns. Denies access to guns.   Review of Systems   Constitutional:  Positive for malaise/fatigue.  Musculoskeletal:  Positive for myalgias.  Psychiatric/Behavioral:  Positive for depression. Negative for suicidal ideas. The patient is nervous/anxious and has insomnia.      Psychiatric and Social History  Psychiatric History:  Information collected from patient, chart review  Prev Dx/Sx: MDD, GAD, PTSD Current Psych Provider: none Home Meds (current): remeron, buspar, atarax PRN Previous Med Trials: cymbalta  Therapy: none  Prior Psych Hospitalization: none  Prior Self Harm: reports suicide attempt 1 month ago Prior Violence: denies   Family Psych History: reports niece with bipolar Family Hx suicide: denies  Social History:  Living Situation: was living at home with husband, mom would come over to help  Access to weapons/lethal means: denies   Substance History Reports was using THC daily due to pain for the past month. Reports she was using this since she wasn't able to establish with a pain clinic and obtain dilaudid.   Exam Findings  Physical Exam:  Vital Signs:  Temp:  [97.3 F (36.3 C)-98.3 F (36.8 C)] 97.8 F (36.6 C) (02/21 0600) Pulse Rate:  [83-113] 100 (02/21 0600) Resp:  [  18-32] 18 (02/21 0600) BP: (70-136)/(42-90) 105/60 (02/21 0600) SpO2:  [97 %-100 %] 100 % (02/21 0600) Weight:  [99.7 kg-101.5 kg] 99.7 kg (02/20 1813) Blood pressure 105/60, pulse 100, temperature 97.8 F (36.6 C), temperature source Oral, resp. rate 18, weight 99.7 kg, last menstrual period 10/10/2012, SpO2 100%. Body mass index is 35.48 kg/m.  Physical Exam Constitutional:      Appearance: She is overweight.     Comments: Chronically ill appearing  Pulmonary:     Effort: Pulmonary effort is normal.  Psychiatric:        Mood and Affect: Mood is depressed.    Mental Status Exam: General Appearance: Casual  Orientation:  Full (Time, Place, and Person)  Memory:  Immediate;   Fair  Concentration:  Concentration: Fair  Recall:   Fair  Attention  Fair  Eye Contact:  Good  Speech:  Clear and Coherent  Language:  Fair  Volume:  Normal  Mood: "Not great, I'm in pain and haven't slept"  Affect:  Labile  Thought Process:  Goal Directed  Thought Content:  Rumination  Suicidal Thoughts:   Denies  Homicidal Thoughts:  No  Judgement:  Poor  Insight:  Shallow  Psychomotor Activity:  Normal  Akathisia:  No  Fund of Knowledge:  Fair      Assets:  Manufacturing systems engineer Desire for Improvement Housing Social Support  Cognition:  WNL  ADL's:  Impaired  AIMS (if indicated):        Other History   These have been pulled in through the EMR, reviewed, and updated if appropriate.  Family History:  The patient's family history includes Congestive Heart Failure (age of onset: 18) in her sister; Diabetes in her brother, father, mother, and sister; Heart disease in her father.  Medical History: Past Medical History:  Diagnosis Date   Acute back pain with sciatica, left    Acute back pain with sciatica, right    Acute encephalopathy 05/29/2022   Acute osteomyelitis of right calcaneus (HCC) 12/21/2022   Anemia, unspecified    Atrial fibrillation with RVR (HCC)-resolved 09/09/2022   Atypical chest pain 09/10/2021   Cancer (HCC)    Carcinoid tumor of duodenum    Chest pain with normal coronary angiography 2019   Chronic a-fib (HCC) 09/09/2022   Chronic pain    Chronic systolic CHF (congestive heart failure) (HCC)    Dehiscence of amputation stump of right lower extremity (HCC) 01/29/2023   Diabetes mellitus    DKA (diabetic ketoacidosis) (HCC)    Drug-seeking behavior    21 hospitalizations and 14 CT a/p in 2 years for N/V and abdominal pain, demanding only IV dilaudid   Elevated troponin    chronic   Esophageal reflux    Fibromyalgia    Gastric ulcer    Gastroparesis    Gout    HCAP (healthcare-associated pneumonia) 06/19/2022   Hyperlipidemia    Hyperosmolar hyperglycemic state (HHS) (HCC) 05/11/2022    Hyperosmolar non-ketotic state due to type 2 diabetes mellitus (HCC) 05/11/2022   Hypertension    Hypomagnesemia    Lumbosacral stenosis    LVH (left ventricular hypertrophy)    Morbid obesity (HCC)    Nausea & vomiting 09/09/2022   NICM (nonischemic cardiomyopathy) (HCC)    PAF (paroxysmal atrial fibrillation) (HCC)    Sepsis (HCC) 11/23/2022   Stroke (HCC) 02/2011   Symptomatic anemia 12/14/2022   Thrombocytosis    Vitamin B12 deficiency anemia     Surgical History: Past Surgical History:  Procedure  Laterality Date   ABDOMINAL AORTOGRAM W/LOWER EXTREMITY N/A 11/29/2022   Procedure: ABDOMINAL AORTOGRAM W/LOWER EXTREMITY;  Surgeon: Daria Pastures, MD;  Location: [facility] INVASIVE CV LAB;  Service: Cardiovascular;  Laterality: N/A;   AMPUTATION Right 12/29/2022   Procedure: RIGHT BELOW KNEE AMPUTATION;  Surgeon: Nadara Mustard, MD;  Location: [facility] OR;  Service: Orthopedics;  Laterality: Right;   AV FISTULA PLACEMENT Left 06/30/2022   Procedure: LEFT BRACHIOCEPHALIC ARTERIOVENOUS (AV) FISTULA CREATION;  Surgeon: Cephus Shelling, MD;  Location: [facility] OR;  Service: Vascular;  Laterality: Left;   BIOPSY  07/27/2019   Procedure: BIOPSY;  Surgeon: Vida Rigger, MD;  Location: [facility] ENDOSCOPY;  Service: Endoscopy;;   BIOPSY  07/30/2019   Procedure: BIOPSY;  Surgeon: Kathi Der, MD;  Location: [facility] ENDOSCOPY;  Service: Gastroenterology;;   CATARACT EXTRACTION  01/2014   CHOLECYSTECTOMY     COLONOSCOPY WITH PROPOFOL N/A 07/30/2019   Procedure: COLONOSCOPY WITH PROPOFOL;  Surgeon: Kathi Der, MD;  Location: [facility] ENDOSCOPY;  Service: Gastroenterology;  Laterality: N/A;   ESOPHAGOGASTRODUODENOSCOPY N/A 07/27/2019   Procedure: ESOPHAGOGASTRODUODENOSCOPY (EGD);  Surgeon: Vida Rigger, MD;  Location: [facility];  Service: Endoscopy;  Laterality: N/A;   ESOPHAGOGASTRODUODENOSCOPY N/A 07/26/2020   Procedure: ESOPHAGOGASTRODUODENOSCOPY (EGD);  Surgeon: Willis Modena, MD;  Location: [facility]OSCOPY;   Service: Endoscopy;  Laterality: N/A;   ESOPHAGOGASTRODUODENOSCOPY (EGD) WITH PROPOFOL N/A 08/02/2019   Procedure: ESOPHAGOGASTRODUODENOSCOPY (EGD) WITH PROPOFOL;  Surgeon: Kathi Der, MD;  Location: [facility] ENDOSCOPY;  Service: Gastroenterology;  Laterality: N/A;   ESOPHAGOGASTRODUODENOSCOPY (EGD) WITH PROPOFOL N/A 12/23/2022   Procedure: ESOPHAGOGASTRODUODENOSCOPY (EGD) WITH PROPOFOL;  Surgeon: Shellia Cleverly, DO;  Location: [facility] ENDOSCOPY;  Service: Gastroenterology;  Laterality: N/A;   FISTULA SUPERFICIALIZATION Left 12/31/2022   Procedure: LEFT ARM FISTULA TRANSPOSITION;  Surgeon: Maeola Harman, MD;  Location: [facility] OR;  Service: Vascular;  Laterality: Left;   GIVENS CAPSULE STUDY N/A 12/23/2022   Procedure: GIVENS CAPSULE STUDY;  Surgeon: Shellia Cleverly, DO;  Location: [facility] ENDOSCOPY;  Service: Gastroenterology;  Laterality: N/A;   HEMOSTASIS CLIP PLACEMENT  08/02/2019   Procedure: HEMOSTASIS CLIP PLACEMENT;  Surgeon: Kathi Der, MD;  Location: [facility] ENDOSCOPY;  Service: Gastroenterology;;   HOT HEMOSTASIS N/A 12/23/2022   Procedure: HOT HEMOSTASIS (ARGON PLASMA COAGULATION/BICAP);  Surgeon: Shellia Cleverly, DO;  Location: [facility] ENDOSCOPY;  Service: Gastroenterology;  Laterality: N/A;   IR FLUORO GUIDE CV LINE RIGHT  06/24/2022   IR US GUIDE VASC ACCESS RIGHT  06/24/2022   POLYPECTOMY  07/30/2019   Procedure: POLYPECTOMY;  Surgeon: Kathi Der, MD;  Location: [facility] ENDOSCOPY;  Service: Gastroenterology;;   POLYPECTOMY  08/02/2019   Procedure: POLYPECTOMY;  Surgeon: Kathi Der, MD;  Location: [facility] ENDOSCOPY;  Service: Gastroenterology;;   STUMP REVISION Right 02/02/2023   Procedure: REVISION RIGHT BELOW KNEE AMPUTATION;  Surgeon: Nadara Mustard, MD;  Location: [facility] OR;  Service: Orthopedics;  Laterality: Right;     Medications:   Current Facility-Administered Medications:    (feeding supplement) PROSource Plus liquid 30 mL, 30 mL, Oral, BID BM, Sundil, Subrina, MD    atorvastatin (LIPITOR) tablet 10 mg, 10 mg, Oral, QPM, Sundil, Subrina, MD   busPIRone (BUSPAR) tablet 5 mg, 5 mg, Oral, TID, Sundil, Subrina, MD, 5 mg at 04/25/23 2035   famotidine (PEPCID) IVPB 20 mg premix, 20 mg, Intravenous, QAC breakfast, [name], Belkys A, MD, Last Rate: 100 mL/hr at 04/28/23 0846, 20 mg at 04/28/23 0846   ferric citrate (AURYXIA) tablet 210 mg, 210 mg, Oral, TID WC, Sundil, Subrina, MD  gabapentin (NEURONTIN) capsule 100 mg, 100 mg, Oral, TID, Sundil, Subrina, MD   Gerhardt's butt cream, , Topical, BID, Almon Hercules, MD, Given at 04/28/23 0302   heparin sodium (porcine) injection 3,800 Units, 3,800 Units, Intracatheter, Continuous PRN, Delano Metz, MD, 3,800 Units at 04/28/23 1817   HYDROmorphone (DILAUDID) injection 0.5 mg, 0.5 mg, Intravenous, Q6H PRN, [name], Belkys A, MD, 0.5 mg at 04/29/23 0130   HYDROmorphone (DILAUDID) tablet 2 mg, 2 mg, Oral, Q6H PRN, Candelaria Stagers T, MD, 2 mg at 04/26/23 1854   hydrOXYzine (ATARAX) tablet 25 mg, 25 mg, Oral, TID PRN, Janalyn Shy, Subrina, MD, 25 mg at 04/28/23 1425   leptospermum manuka honey (MEDIHONEY) paste 1 Application, 1 Application, Topical, Daily, Alanda Slim, Taye T, MD, 1 Application at 04/28/23 1351   levalbuterol (XOPENEX) nebulizer solution 0.63 mg, 0.63 mg, Nebulization, Q6H PRN, Janalyn Shy, Subrina, MD   liver oil-zinc oxide (DESITIN) 40 % ointment, , Topical, Q8H PRN, Sundil, Subrina, MD   loperamide (IMODIUM) capsule 2 mg, 2 mg, Oral, PRN, Janalyn Shy, Subrina, MD, 2 mg at 04/28/23 0104   magic mouthwash w/lidocaine, 5 mL, Oral, QID, Alanda Slim, Taye T, MD   methocarbamol (ROBAXIN) tablet 500 mg, 500 mg, Oral, Q6H PRN, Janalyn Shy, Subrina, MD, 500 mg at 04/28/23 0105   metoprolol tartrate (LOPRESSOR) tablet 25 mg, 25 mg, Oral, BID, Sundil, Subrina, MD   midodrine (PROAMATINE) tablet 5 mg, 5 mg, Oral, TID WC, Sundil, Subrina, MD, 5 mg at 04/28/23 1652   midodrine (PROAMATINE) tablet 5 mg, 5 mg, Oral, Once, Delano Metz, MD    mirtazapine (REMERON) tablet 15 mg, 15 mg, Oral, QHS, Sundil, Subrina, MD   ondansetron (ZOFRAN) tablet 4 mg, 4 mg, Oral, Q6H PRN **OR** ondansetron (ZOFRAN) injection 4 mg, 4 mg, Intravenous, Q6H PRN, Janalyn Shy, Subrina, MD, 4 mg at 04/29/23 0135   pantoprazole (PROTONIX) injection 40 mg, 40 mg, Intravenous, Q12H, [name], Belkys A, MD, 40 mg at 04/28/23 2147   risperiDONE (RISPERDAL) tablet 1 mg, 1 mg, Oral, QHS, Karie Fetch, MD   sodium bicarbonate tablet 650 mg, 650 mg, Oral, BID, Sundil, Subrina, MD   sodium chloride flush (NS) 0.9 % injection 3 mL, 3 mL, Intravenous, Q12H, Sundil, Subrina, MD, 3 mL at 04/28/23 0846   sodium chloride flush (NS) 0.9 % injection 3 mL, 3 mL, Intravenous, Q12H, Sundil, Subrina, MD, 3 mL at 04/28/23 2141   sodium chloride flush (NS) 0.9 % injection 3 mL, 3 mL, Intravenous, PRN, Janalyn Shy, Subrina, MD   torsemide [facility]) tablet 100 mg, 100 mg, Oral, Once per day on Sunday Monday Wednesday Friday, Pola Corn, NP  Allergies: Allergies  Allergen Reactions   Gabapentin Hives and [name]   Isovue [Iopamidol] Anaphylaxis, [name] and Other (See Comments)    11/28/17 Patient had seizure like activity and then 1 min code after 100 cc of isovue 300. Possible contrast allergy vs vasovagal episode  Cardiac Arrest   Nsaids Anaphylaxis and Other (See Comments)    Hx of stomach ulcers   Penicillins Itching, Palpitations and Other (See Comments)    Flushing (Red Skin) Laryngeal Edema   Reglan [Metoclopramide] Other (See Comments)    Tardive dyskinesia    Valium [Diazepam] [name]   Zestril [Lisinopril] Anaphylaxis and Swelling    Tongue and mouth swelling Laryngeal Edema   Tolectin [Tolmetin] Nausea And Vomiting, Nausea Only and Other (See Comments)    Irritates stomach ulcer   Asa [Aspirin] Other (See Comments)    Hx of  stomach ulcer   Aspartame And Phenylalanine Hives   Bentyl [Dicyclomine] Other (See Comments)     Chest pain   Hibiclens [Chlorhexidine Gluconate] Other (See Comments)    Dermatitis    Flexeril [Cyclobenzaprine] Palpitations   Oxycontin [Oxycodone] Palpitations   Rifamycins Palpitations   Tylenol [Acetaminophen] Nausea And Vomiting, Nausea Only and Other (See Comments)    Irritates stomach ulcer Abdominal pain   Ultram [Tramadol] Nausea And Vomiting and Palpitations    Karie Fetch, MD

## 2023-04-29 NOTE — Progress Notes (Addendum)
Deborah [name] Progress Note   Subjective:    Seen and examined patient at bedside. Noted HD machine clotted off and patient refused to have a new system be set up. Noted she ended up s/o AMA from treatment. Noted net UF 1.8L. Psychiatry consulted. Next HD 2/22.  Objective Vitals:   04/29/23 0100 04/29/23 0125 04/29/23 0600 04/29/23 0800  BP: 94/62 97/74 105/60 107/61  Pulse: 89  100 91  Resp: 18  18 18   Temp:   97.8 F (36.6 C) 97.9 F (36.6 C)  TempSrc:   Oral Oral  SpO2: 100%  100% 100%  Weight:       Physical Exam General: Chronically ill appearing female in NAD Heart: S1,S2 No M/R/G SR Lungs: CTAB  Abdomen: Obese NABs Extremities: R BKA, trace edema R stump; trace edema LLE; Drsg L foot Dialysis Access: [facility] drsg intact  Filed Weights   04/26/23 1230 04/28/23 1447 04/28/23 1813  Weight: 102.9 kg 101.5 kg 99.7 kg    Intake/Output Summary (Last 24 hours) at 04/29/2023 1301 Last data filed at 04/28/2023 2009 Gross per 24 hour  Intake 420 ml  Output 1800 ml  Net -1380 ml    Additional Objective Labs: Basic Metabolic Panel: Recent Labs  Lab 04/26/23 0618 04/26/23 0619 04/28/23 0510 04/29/23 0900  NA 136 135 139 135  K 3.1* 3.0* 2.9* 3.0*  CL 104 105 103 99  CO2 17* 17* 21* 25  GLUCOSE 81 90 96 93  BUN 50* 48* 27* 12  CREATININE 8.66* 8.88* 6.80* 4.70*  CALCIUM 8.4* 8.5* 8.9 8.5*  PHOS 10.7*  --  7.0*  --    Liver Function Tests: Recent Labs  Lab 04/25/23 1409 04/26/23 0618 04/26/23 0619 04/28/23 0510  AST 9*  --  9*  --   ALT 8  --  7  --   ALKPHOS 80  --  78  --   BILITOT 0.4  --  0.5  --   PROT 6.5  --  6.1*  --   ALBUMIN 2.3* 2.2* 2.1* 2.5*   Recent Labs  Lab 04/25/23 1409  LIPASE 24   CBC: Recent Labs  Lab 04/25/23 1409 04/25/23 1624 04/26/23 0619 04/28/23 0457 04/29/23 0900  WBC 12.6*  --  12.9* 10.1 9.5  HGB 8.2*   < > 8.2* 7.9* 7.5*  HCT 27.2*   < > 25.9* 25.6* 24.3*  MCV 95.4  --  94.5 96.2 94.9  PLT 288  --   273 266 304   < > = values in this interval not displayed.   Blood Culture    Component Value Date/Time   SDES BLOOD BLOOD RIGHT HAND 02/17/2023 2243   SPECREQUEST  02/17/2023 2243    BOTTLES DRAWN AEROBIC AND ANAEROBIC Blood Culture results may not be optimal due to an inadequate volume of blood received in culture bottles   CULT  02/17/2023 2243    NO GROWTH 5 DAYS Performed at [facility], [address] [place], [place] [address]    REPTSTATUS 02/22/2023 FINAL 02/17/2023 2243    Cardiac Enzymes: No results for input(s): "CKTOTAL", "CKMB", "CKMBINDEX", "TROPONINI" in the last 168 hours. CBG: Recent Labs  Lab 04/28/23 0624 04/28/23 1423 04/28/23 2135 04/29/23 0811 04/29/23 1128  GLUCAP 83 97 117* 88 85   Iron Studies:  Recent Labs    04/28/23 0510  IRON 61  TIBC 147*  FERRITIN 1,349*   Lab Results  Component Value Date   INR 1.5 (  H) 03/22/2023   INR 1.9 (H) 12/15/2022   INR 1.8 (H) 11/23/2022   Studies/Results: No results found.  Medications:  famotidine (PEPCID) IV 20 mg (04/29/23 1101)   heparin sodium (porcine)      (feeding supplement) PROSource Plus  30 mL Oral BID BM   atorvastatin  10 mg Oral QPM   busPIRone  5 mg Oral TID   ferric citrate  210 mg Oral TID WC   gabapentin  100 mg Oral TID   Gerhardt's butt cream   Topical BID   leptospermum manuka honey  1 Application Topical Daily   magic mouthwash w/lidocaine  5 mL Oral QID   metoprolol tartrate  25 mg Oral BID   midodrine  5 mg Oral TID WC   midodrine  5 mg Oral Once   mirtazapine  15 mg Oral QHS   ondansetron (ZOFRAN) IV  4 mg Intravenous Q8H   pantoprazole (PROTONIX) IV  40 mg Intravenous Q12H   potassium chloride  20 mEq Per Tube Once   risperiDONE  1 mg Oral QHS   sodium bicarbonate  650 mg Oral BID   sodium chloride flush  3 mL Intravenous Q12H   sodium chloride flush  3 mL Intravenous Q12H   torsemide  100 mg Oral Once per day on Sunday Monday Wednesday Friday     Dialysis Orders: [facility] T,TH,S 4 hr 180NRe 400/800 112 kg 3.0 K/2.5 Ca TDC - No Heparin  - Hectorol 5 mcg IV three times per week - Mircera 150 mcg IV q 2 weeks (last dose 04/09/2023)  Assessment/Plan: Volume overload - d/t missing HD which were transportation issues per the pt. Mostly LE edema. Max UF w/ HD.  ESRD - on HD TTS. Next HD 04/30/2023. Noted HD machine clotted off yesterday. Do not see any contraindications with using Heparin. Will give 2000 unit bolus with HD tomorrow. Hypokalemia-Follow labs. Use 4 K bath. Continue Supplementing with oral K+ if needed.  Hypotension - chronic hypotension it appears. BP here is soft to low-normal. On midodrine 5 tid, takes BB for afib most likely.  Volume - By bed scales, now under OP EDW. Lungs clear, chronic LE edema. Lower EDW if possible on DD. Continue Torsemide on non HD days Anemia of esrd - Hb 8- 9 here. ESA was due 04/23/2023, Given Darbe 200 mcg sQ 04/26/2023. Follow HGB. Transfuse prn.  Secondary hyperparathyroidism - CCa in range, add on phos. Resume binders w/ meals.  Atrial fib - chronic. No anticoagulation.   Deborah Holmes, NP [name] 04/29/2023,1:01 PM  LOS: 4 days

## 2023-04-29 NOTE — TOC Progression Note (Addendum)
Transition of Care [facility]) - Progression Note    Patient Details  Name: Deborah [name] MRN: [id number] Date of Birth: [birth year]  Transition [facility] Care [facility]) CM/SW Contact  Lorri Frederick, LCSW Phone Number: 04/29/2023, 11:56 AM  Clinical Narrative:   Bed offers provided to pt on medicare choice document.  She will review.   1250: CSW spoke with pt again, she is asking for response from [facility]. Also asking if Sonny Dandy could take her to her usual HD center.  Messages sent.   1430: [facility] is full.  CSW confirmed with [facility] and they can go to [facility].  Pt updated and does want to accept offer at [facility].    1530: CSW informed pt now asking for [facility] contact person Harriett Sine [phone]. CSW attempted to call but was not able to make a referral.    Expected Discharge Plan: Skilled Nursing Facility Barriers to Discharge: Continued Medical Work up, SNF Pending bed offer  Expected Discharge Plan and Services In-house Referral: Clinical Social Work   Post Acute Care Choice: Skilled Nursing Facility Living arrangements for the past 2 months: Single Family Home                                       Social Determinants of Health (SDOH) Interventions SDOH Screenings   Food Insecurity: No Food Insecurity (04/25/2023)  Recent Concern: Food Insecurity - High Risk (01/25/2023)   Received from [facility]  Housing: High Risk (04/25/2023)  Transportation Needs: Unmet Transportation Needs (04/25/2023)  Utilities: Not At Risk (04/25/2023)  Recent Concern: Utilities - Medium Risk (01/25/2023)   Received from [facility]  Depression (PHQ2-9): Medium Risk (03/16/2023)  Financial Resource Strain: Low Risk (03/23/2021)   Received from [facility])  Physical Activity: Not on File (10/31/2017)   Received from [place], [place]  Social Connections: Unknown (07/19/2021)   Received from [facility], [facility]  Stress: Low  Risk (03/23/2021)   Received from [facility])  Tobacco Use: Low Risk  (04/25/2023)    Readmission Risk Interventions    03/07/2023    4:19 PM 11/25/2022    5:16 PM 07/05/2022    1:20 PM  Readmission Risk Prevention Plan  Transportation Screening Complete Complete Complete  Medication Review (RN Care Manager) Complete Complete Referral to Pharmacy  PCP or Specialist appointment within 3-5 days of discharge Complete Complete Complete  HRI or Home Care Consult Complete Complete Complete  SW Recovery Care/Counseling Consult Complete Complete Complete  Palliative Care Screening Complete Not Applicable Not Applicable  Skilled Nursing Facility Not Applicable Not Applicable Not Applicable

## 2023-04-29 NOTE — Care Management Important Message (Signed)
Important Message  Patient Details  Name: Deborah [name] MRN: [id number] Date of Birth: 08/02/1965   Important Message Given:  Yes - Medicare IM     Sherilyn Banker 04/29/2023, 3:19 PM

## 2023-04-30 ENCOUNTER — Encounter: Payer: Self-pay | Admitting: Internal Medicine

## 2023-04-30 DIAGNOSIS — R52 Pain, unspecified: Secondary | ICD-10-CM | POA: Diagnosis not present

## 2023-04-30 DIAGNOSIS — L97521 Non-pressure chronic ulcer of other part of left foot limited to breakdown of skin: Secondary | ICD-10-CM

## 2023-04-30 DIAGNOSIS — I739 Peripheral vascular disease, unspecified: Secondary | ICD-10-CM | POA: Diagnosis not present

## 2023-04-30 DIAGNOSIS — L97421 Non-pressure chronic ulcer of left heel and midfoot limited to breakdown of skin: Secondary | ICD-10-CM

## 2023-04-30 DIAGNOSIS — Z992 Dependence on renal dialysis: Secondary | ICD-10-CM | POA: Diagnosis not present

## 2023-04-30 DIAGNOSIS — N186 End stage renal disease: Secondary | ICD-10-CM | POA: Diagnosis not present

## 2023-04-30 DIAGNOSIS — S88111A Complete traumatic amputation at level between knee and ankle, right lower leg, initial encounter: Secondary | ICD-10-CM

## 2023-04-30 LAB — CBC WITH DIFFERENTIAL/PLATELET
Abs Immature Granulocytes: 0.06 10*3/uL (ref 0.00–0.07)
Basophils Absolute: 0.1 10*3/uL (ref 0.0–0.1)
Basophils Relative: 1 %
Eosinophils Absolute: 0.3 10*3/uL (ref 0.0–0.5)
Eosinophils Relative: 3 %
HCT: 23.9 % — ABNORMAL LOW (ref 36.0–46.0)
Hemoglobin: 7.5 g/dL — ABNORMAL LOW (ref 12.0–15.0)
Immature Granulocytes: 1 %
Lymphocytes Relative: 27 %
Lymphs Abs: 2.7 10*3/uL (ref 0.7–4.0)
MCH: 29.9 pg (ref 26.0–34.0)
MCHC: 31.4 g/dL (ref 30.0–36.0)
MCV: 95.2 fL (ref 80.0–100.0)
Monocytes Absolute: 0.6 10*3/uL (ref 0.1–1.0)
Monocytes Relative: 6 %
Neutro Abs: 6.4 10*3/uL (ref 1.7–7.7)
Neutrophils Relative %: 62 %
Platelets: 301 10*3/uL (ref 150–400)
RBC: 2.51 MIL/uL — ABNORMAL LOW (ref 3.87–5.11)
RDW: 14.6 % (ref 11.5–15.5)
WBC: 10.1 10*3/uL (ref 4.0–10.5)
nRBC: 0 % (ref 0.0–0.2)

## 2023-04-30 LAB — RENAL FUNCTION PANEL
Albumin: 2.4 g/dL — ABNORMAL LOW (ref 3.5–5.0)
Anion gap: 18 — ABNORMAL HIGH (ref 5–15)
BUN: 16 mg/dL (ref 6–20)
CO2: 24 mmol/L (ref 22–32)
Calcium: 9.7 mg/dL (ref 8.9–10.3)
Chloride: 96 mmol/L — ABNORMAL LOW (ref 98–111)
Creatinine, Ser: 5.74 mg/dL — ABNORMAL HIGH (ref 0.44–1.00)
GFR, Estimated: 8 mL/min — ABNORMAL LOW (ref >60)
Glucose, Bld: 97 mg/dL (ref 70–99)
Phosphorus: 5.5 mg/dL — ABNORMAL HIGH (ref 2.5–4.6)
Potassium: 3.1 mmol/L — ABNORMAL LOW (ref 3.5–5.1)
Sodium: 138 mmol/L (ref 135–145)

## 2023-04-30 LAB — GLUCOSE, CAPILLARY
Glucose-Capillary: 89 mg/dL (ref 70–99)
Glucose-Capillary: 89 mg/dL (ref 70–99)
Glucose-Capillary: 118 mg/dL — ABNORMAL HIGH (ref 70–99)

## 2023-04-30 MED ORDER — ONDANSETRON 4 MG PO TBDP: 4.0000 mg | ORAL_TABLET | Freq: Three times a day (TID) | ORAL | Status: DC | PRN

## 2023-04-30 MED ORDER — LIDOCAINE HCL (PF) 1 % IJ SOLN: 5.0000 mL | INTRAMUSCULAR | Status: DC | PRN

## 2023-04-30 MED ORDER — LIDOCAINE-PRILOCAINE 2.5-2.5 % EX CREA: 1.0000 | TOPICAL_CREAM | CUTANEOUS | Status: DC | PRN

## 2023-04-30 MED ORDER — ALBUMIN HUMAN 25 % IV SOLN
12.5000 g | Freq: Once | INTRAVENOUS | Status: AC
  Administered 2023-04-30: 12.5 g via INTRAVENOUS
  Filled 2023-04-30: qty 100

## 2023-04-30 MED ORDER — PENTAFLUOROPROP-TETRAFLUOROETH EX AERO: 1.0000 | INHALATION_SPRAY | CUTANEOUS | Status: DC | PRN

## 2023-04-30 MED ORDER — HEPARIN SODIUM (PORCINE) 1000 UNIT/ML DIALYSIS: 1000.0000 [IU] | INTRAMUSCULAR | Status: DC | PRN

## 2023-04-30 MED ORDER — ALTEPLASE 2 MG IJ SOLR: 2.0000 mg | Freq: Once | INTRAMUSCULAR | Status: DC | PRN

## 2023-04-30 MED ORDER — HEPARIN SODIUM (PORCINE) 1000 UNIT/ML IJ SOLN
3800.0000 [IU] | Freq: Once | INTRAMUSCULAR | Status: AC
  Administered 2023-04-30: 3800 [IU]
  Filled 2023-04-30: qty 4

## 2023-04-30 MED ORDER — MIDODRINE HCL 5 MG PO TABS
10.0000 mg | ORAL_TABLET | Freq: Once | ORAL | Status: DC
  Filled 2023-04-30: qty 2

## 2023-04-30 MED ORDER — HEPARIN SODIUM (PORCINE) 1000 UNIT/ML IJ SOLN
2000.0000 [IU] | Freq: Once | INTRAMUSCULAR | Status: AC
  Administered 2023-04-30: 2000 [IU]
  Filled 2023-04-30: qty 2

## 2023-04-30 NOTE — Progress Notes (Addendum)
 Received patient in bed.Awake,alert and oriented x 4.  Access used : Rt HD catheter that  did not worked well.Dressing on date.Her catheter  has no retention suture ,so it twisted around when she moved,that made the HD machine beeped a lot,tried to reinforced with tegaderm dressing to prevent the catheter twisting around but did not help much.Need retention suture on her catheter.  Durationof treatment : 2.45 hours  UF goal : 1,100 cc.  Tolerated treatment: Yes.  Medicine given: Atarax 25 mg.                           Midodrine 10 mg.                           Albumin 25 g.  Hemo comment:Circuit clogged midway to her treatment.Patient is moving around the bed.Advised the right position for his optimum blood flow of hd catheter but patient was not compliant and everytime it beeped ,she was irritated ,eventually quit on her last 45 minutes of her treatment.She signed AMA form  Hand off to thje patient's nurse: Back into her room with stable medical condition via transporter.

## 2023-04-30 NOTE — Consult Note (Signed)
 ORTHOPAEDIC CONSULTATION  REQUESTING PHYSICIAN: Alba Cory, MD  Chief Complaint: Chronic ulcer lateral left heel with right transtibial amputation.  HPI: Deborah [name] is a 58 y.o. female who presents with multiple medical problems.  Patient also has a right transtibial amputation and chronic ulcer lateral left heel.  Currently using dressing changes with Vashe.  Past Medical History:  Diagnosis Date   Acute back pain with sciatica, left    Acute back pain with sciatica, right    Acute encephalopathy 05/29/2022   Acute osteomyelitis of right calcaneus (HCC) 12/21/2022   Anemia, unspecified    Atrial fibrillation with RVR (HCC)-resolved 09/09/2022   Atypical chest pain 09/10/2021   Cancer (HCC)    Carcinoid tumor of duodenum    Chest pain with normal coronary angiography 2019   Chronic a-fib (HCC) 09/09/2022   Chronic pain    Chronic systolic CHF (congestive heart failure) (HCC)    Dehiscence of amputation stump of right lower extremity (HCC) 01/29/2023   Diabetes mellitus    DKA (diabetic ketoacidosis) (HCC)    Drug-seeking behavior    21 hospitalizations and 14 CT a/p in 2 years for N/V and abdominal pain, demanding only IV dilaudid   Elevated troponin    chronic   Esophageal reflux    Fibromyalgia    Gastric ulcer    Gastroparesis    Gout    HCAP (healthcare-associated pneumonia) 06/19/2022   Hyperlipidemia    Hyperosmolar hyperglycemic state (HHS) (HCC) 05/11/2022   Hyperosmolar non-ketotic state due to type 2 diabetes mellitus (HCC) 05/11/2022   Hypertension    Hypomagnesemia    Lumbosacral stenosis    LVH (left ventricular hypertrophy)    Morbid obesity (HCC)    Nausea & vomiting 09/09/2022   NICM (nonischemic cardiomyopathy) (HCC)    PAF (paroxysmal atrial fibrillation) (HCC)    Sepsis (HCC) 11/23/2022   Stroke (HCC) 02/2011   Symptomatic anemia 12/14/2022   Thrombocytosis    Vitamin B12 deficiency anemia    Past Surgical History:   Procedure Laterality Date   ABDOMINAL AORTOGRAM W/LOWER EXTREMITY N/A 11/29/2022   Procedure: ABDOMINAL AORTOGRAM W/LOWER EXTREMITY;  Surgeon: Daria Pastures, MD;  [facility]: [facility];  Service: Cardiovascular;  Laterality: N/A;   AMPUTATION Right 12/29/2022   Procedure: RIGHT BELOW KNEE AMPUTATION;  Surgeon: Nadara Mustard, MD;  Location: [facility] OR;  Service: Orthopedics;  Laterality: Right;   AV FISTULA PLACEMENT Left 06/30/2022   Procedure: LEFT BRACHIOCEPHALIC ARTERIOVENOUS (AV) FISTULA CREATION;  Surgeon: Cephus Shelling, MD;  Location: [facility] OR;  Service: Vascular;  Laterality: Left;   BIOPSY  07/27/2019   Procedure: BIOPSY;  Surgeon: Vida Rigger, MD;  Location: [facility]OS[facility];  Service: Endoscopy;;   BIOPSY  07/30/2019   Procedure: BIOPSY;  Surgeon: Kathi Der, MD;  Location: [facility];  Service: Gastroenterology;;   CATARACT EXTRACTION  01/2014   CHOLECYSTECTOMY     COLONOSCOPY WITH PROPOFOL N/A 07/30/2019   Procedure: COLONOSCOPY WITH PROPOFOL;  Surgeon: Kathi Der, MD;  Location: [facility];  Service: Gastroenterology;  Laterality: N/A;   ESOPHAGOGASTRODUODENOSCOPY N/A 07/27/2019   Procedure: ESOPHAGOGASTRODUODENOSCOPY (EGD);  Surgeon: Vida Rigger, MD;  Location: [facility];  Service: Endoscopy;  Laterality: N/A;   ESOPHAGOGASTRODUODENOSCOPY N/A 07/26/2020   Procedure: ESOPHAGOGASTRODUODENOSCOPY (EGD);  Surgeon: Willis Modena, MD;  Location: [facility];  Service: Endoscopy;  Laterality: N/A;   ESOPHAGOGASTRODUODENOSCOPY (EGD) WITH PROPOFOL N/A 08/02/2019   Procedure: ESOPHAGOGASTRODUODENOSCOPY (EGD) WITH PROPOFOL;  Surgeon: Kathi Der, MD;  Location: [facility]  ENDOSCOPY;  Service: Gastroenterology;  Laterality: N/A;   ESOPHAGOGASTRODUODENOSCOPY (EGD) WITH PROPOFOL N/A 12/23/2022   Procedure: ESOPHAGOGASTRODUODENOSCOPY (EGD) WITH PROPOFOL;  Surgeon: Shellia Cleverly, DO;  Location: [facility];  Service: Gastroenterology;  Laterality: N/A;   FISTULA  SUPERFICIALIZATION Left 12/31/2022   Procedure: LEFT ARM FISTULA TRANSPOSITION;  Surgeon: Maeola Harman, MD;  Location: [facility] OR;  Service: Vascular;  Laterality: Left;   GIVENS CAPSULE STUDY N/A 12/23/2022   Procedure: GIVENS CAPSULE STUDY;  Surgeon: Shellia Cleverly, DO;  Location: [facility];  Service: Gastroenterology;  Laterality: N/A;   HEMOSTASIS CLIP PLACEMENT  08/02/2019   Procedure: HEMOSTASIS CLIP PLACEMENT;  Surgeon: Kathi Der, MD;  Location: [facility];  Service: Gastroenterology;;   HOT HEMOSTASIS N/A 12/23/2022   Procedure: HOT HEMOSTASIS (ARGON PLASMA COAGULATION/BICAP);  Surgeon: Shellia Cleverly, DO;  Location: [facility] ENDOSCOPY;  Service: Gastroenterology;  Laterality: N/A;   IR FLUORO GUIDE CV LINE RIGHT  06/24/2022   IR US GUIDE VASC ACCESS RIGHT  06/24/2022   POLYPECTOMY  07/30/2019   Procedure: POLYPECTOMY;  Surgeon: Kathi Der, MD;  Location: [facility];  Service: Gastroenterology;;   POLYPECTOMY  08/02/2019   Procedure: POLYPECTOMY;  Surgeon: Kathi Der, MD;  Location: [facility];  Service: Gastroenterology;;   STUMP REVISION Right 02/02/2023   Procedure: REVISION RIGHT BELOW KNEE AMPUTATION;  Surgeon: Nadara Mustard, MD;  Location: [facility] OR;  Service: Orthopedics;  Laterality: Right;   Social History   Socioeconomic History   Marital status: Married    Spouse name: Not on file   Number of children: Not on file   Years of education: Not on file   Highest education level: Not on file  Occupational History   Not on file  Tobacco Use   Smoking status: Never   Smokeless tobacco: Never  Vaping Use   Vaping status: Never Used  Substance and Sexual Activity   Alcohol use: No   Drug use: No   Sexual activity: Not Currently    Birth control/protection: None  Other Topics Concern   Not on file  Social History Narrative   ** Merged History Encounter **       Social Drivers of Health   Financial Resource Strain: Low Risk  (03/23/2021)   Received from [facility])   Financial Resource Strain  Food Insecurity: No Food Insecurity (04/25/2023)   Hunger Vital Sign    Worried About Running Out of Food in the Last Year: Never true    Ran Out of Food in the Last Year: Never true  Recent Concern: Food Insecurity - High Risk (01/25/2023)   Received from [facility]   Hunger Vital Sign    Worried About Running Out of Food in the Last Year: Patient declined to answer    Ran Out of Food in the Last Year: Often true  Transportation Needs: Unmet Transportation Needs (04/25/2023)   PRAPARE - Administrator, Civil Service (Medical): Yes    Lack of Transportation (Non-Medical): Yes  Physical Activity: Not on File (10/31/2017)   Received from [place], [place]   Physical Activity    Physical Activity: 0  Stress: Low Risk (03/23/2021)   [facility])   Stress    Over the last 2 weeks, how often have you been bothered by the following problems: feeling nervous, anxious, on edge?: Not at all    Over the last 2 weeks, how often have you been bothered by  the following problems: Not being able to stop or control worrying?: Not at all  Social Connections: Unknown (07/19/2021)   Received from [facility]   Social Network    Social Network: Not on file   Family History  Problem Relation Age of Onset   Diabetes Mother    Diabetes Father    Heart disease Father    Diabetes Sister    Congestive Heart Failure Sister 37   Diabetes Brother    - negative except otherwise stated in the family history section Allergies  Allergen Reactions   Gabapentin Hives and [name]   Isovue [Iopamidol] Anaphylaxis, [name] and Other (See Comments)    11/28/17 Patient had seizure like activity and then 1 min code after 100 cc of isovue 300. Possible contrast allergy vs vasovagal episode  Cardiac Arrest   Nsaids  Anaphylaxis and Other (See Comments)    Hx of stomach ulcers   Penicillins Itching, Palpitations and Other (See Comments)    Flushing (Red Skin) Laryngeal Edema   Reglan [Metoclopramide] Other (See Comments)    Tardive dyskinesia    Valium [Diazepam] [name]   Zestril [Lisinopril] Anaphylaxis and Swelling    Tongue and mouth swelling Laryngeal Edema   Tolectin [Tolmetin] Nausea And Vomiting, Nausea Only and Other (See Comments)    Irritates stomach ulcer   Asa [Aspirin] Other (See Comments)    Hx of stomach ulcer   Aspartame And Phenylalanine Hives   Bentyl [Dicyclomine] Other (See Comments)    Chest pain   Hibiclens [Chlorhexidine Gluconate] Other (See Comments)    Dermatitis    Flexeril [Cyclobenzaprine] Palpitations   Oxycontin [Oxycodone] Palpitations   Rifamycins Palpitations   Tylenol [Acetaminophen] Nausea And Vomiting, Nausea Only and Other (See Comments)    Irritates stomach ulcer Abdominal pain   Ultram [Tramadol] Nausea And Vomiting and Palpitations   Prior to Admission medications   Medication Sig Start Date End Date Taking? Authorizing Provider  albuterol (PROVENTIL) (2.5 MG/3ML) 0.083% nebulizer solution Take 3 mLs (2.5 mg total) by nebulization every 6 (six) hours as needed for wheezing or shortness of breath. 04/06/19  Yes Bing Neighbors, NP  allopurinol (ZYLOPRIM) 100 MG tablet Take 1 tablet (100 mg total) by mouth 2 (two) times daily. 11/30/22  Yes Azucena Fallen, MD  ascorbic acid (VITAMIN C) 500 MG tablet Take 500 mg by mouth daily.   Yes [provider]  atorvastatin (LIPITOR) 10 MG tablet Take 10 mg by mouth every evening. 12/03/21  Yes [provider]  busPIRone (BUSPAR) 5 MG tablet Take 5 mg by mouth 3 (three) times daily.   Yes [provider]  carvedilol (COREG) 12.5 MG tablet Take 12.5 mg by mouth 2 (two) times daily with a meal.   Yes [provider]  cetirizine (ZYRTEC) 10 MG tablet Take 10 mg by  mouth daily. 08/05/22  Yes [provider]  famotidine (PEPCID) 20 MG tablet Take 20 mg by mouth daily before breakfast.   Yes [provider]  ferric citrate (AURYXIA) 1 GM 210 MG(Fe) tablet Take 2 tablets (420 mg total) by mouth 3 (three) times daily with meals. Patient taking differently: Take 210 mg by mouth 3 (three) times daily with meals. 11/30/22  Yes Azucena Fallen, MD  fluticasone [facility]) 50 MCG/ACT nasal spray Place 2 sprays into both nostrils daily as needed for allergies or rhinitis. 12/19/18  Yes Rai, Delene Ruffini, MD  folic acid (FOLVITE) 1  MG tablet Take 1 tablet (1 mg total) by mouth daily. 01/10/23  Yes Danford, Earl Lites, MD  hydrALAZINE (APRESOLINE) 50 MG tablet Take 50 mg by mouth 3 (three) times daily.   Yes [provider]  hydrOXYzine (ATARAX) 25 MG tablet Take 1 tablet (25 mg total) by mouth 3 (three) times daily as needed for anxiety. Patient taking differently: Take 25 mg by mouth See admin instructions. Give 25 mg (1 tablet) every 6 hours as needed for anxiety, agitation for 14 days (starting 11/19), then every 8 hours as needed. 01/10/23  Yes Danford, Earl Lites, MD  insulin lispro (HUMALOG) 100 UNIT/ML KwikPen Before each meal 3 times a day, 140-199 - 2 units, 200-250 - 6 units, 251-299 - 8 units,  300-349 - 12 units,  350 or above 14 units. Patient taking differently: Inject 0-14 Units into the skin See admin instructions. Inject 0-14 units per sliding scale before meals: < 70 notify MD 70-139 : 0 units 140-199 : 2 units 200-250 : 6 units 251-299 : 8 units 300-349 : 12 units 350-400 : 14 units > 401 notify MD. 06/04/22  Yes Leroy Sea, MD  isosorbide mononitrate (IMDUR) 60 MG 24 hr tablet Take 60 mg by mouth daily.   Yes [provider]  Lactobacillus (ACIDOPHILUS) CAPS capsule Take 1 capsule by mouth 3 (three) times daily with meals. 03/08/23  Yes Elgergawy, Leana Roe, MD  leptospermum manuka honey (MEDIHONEY) PSTE  paste Apply 1 Application topically daily. 03/08/23  Yes Elgergawy, Leana Roe, MD  loperamide (IMODIUM) 2 MG capsule Take 1 capsule (2 mg total) by mouth as needed for diarrhea or loose stools. 03/08/23  Yes Elgergawy, Leana Roe, MD  methocarbamol (ROBAXIN) 500 MG tablet Take 1 tablet (500 mg total) by mouth every 6 (six) hours as needed for muscle spasms. 01/10/23  Yes Danford, Earl Lites, MD  metoprolol tartrate (LOPRESSOR) 25 MG tablet Take 1 tablet (25 mg total) by mouth 2 (two) times daily. 03/08/23  Yes Elgergawy, Leana Roe, MD  midodrine (PROAMATINE) 5 MG tablet Take 5 mg by mouth 3 (three) times daily. 02/18/23  Yes [provider]  mirtazapine (REMERON) 15 MG tablet Take 15 mg by mouth at bedtime. 10/18/22  Yes [provider]  Multiple Vitamins-Minerals (SENIOR MULTIVITAMIN PLUS PO) Take 1 tablet by mouth daily.   Yes [provider]  nutrition supplement, JUVEN, (JUVEN) PACK Take 1 packet by mouth 2 (two) times daily between meals. 01/10/23  Yes Danford, Earl Lites, MD  Nutritional Supplements (,FEEDING SUPPLEMENT, PROSOURCE PLUS) liquid Take 30 mLs by mouth 2 (two) times daily between meals. 01/10/23  Yes Danford, Earl Lites, MD  pantoprazole (PROTONIX) 40 MG tablet Take 1 tablet (40 mg total) by mouth 2 (two) times daily. 02/18/23  Yes Raulkar, Drema Pry, MD  spironolactone (ALDACTONE) 25 MG tablet Take 12.5 mg by mouth daily.   Yes [provider]  HYDROmorphone (DILAUDID) 2 MG tablet Wean Prescription. Dilaudid  2 mg one tablet 4 times a day as needed for pain  for 5 days. Dilaudid 2 mg one tablet 3 times a day as needed for pain for 5 days. Dilaudid 2 mg one tablet as needed for pain twice a day for 5 days. Dilaudid 2 mg one tablet daily for 5 days as needed for pain. Dilaudid 2 mg every other day for three days then discontinued. Patient not taking: Reported on 04/26/2023 03/29/23   Jones Bales, NP  hydrOXYzine (VISTARIL) 25 MG capsule Take  25 mg  by mouth 3 (three) times daily as needed. Patient not taking: Reported on 04/26/2023 04/04/23   [provider]   No results found. - pertinent xrays, CT, MRI studies were reviewed and independently interpreted  Positive ROS: All other systems have been reviewed and were otherwise negative with the exception of those mentioned in the HPI and as above.  Physical Exam: General: Alert, no acute distress Psychiatric: Patient is competent for consent with normal mood and affect Lymphatic: No axillary or cervical lymphadenopathy Cardiovascular: No pedal edema Respiratory: No cyanosis, no use of accessory musculature GI: No organomegaly, abdomen is soft and non-tender    Images:  @ENCIMAGES @  Labs:  Lab Results  Component Value Date   HGBA1C 5.7 (H) 02/04/2023   HGBA1C 8.1 (H) 06/19/2022   HGBA1C 10.1 (H) 03/21/2022   ESRSEDRATE 90 (H) 01/29/2023   ESRSEDRATE 119 (H) 12/21/2022   ESRSEDRATE 122 (H) 11/24/2022   CRP 5.9 (H) 02/11/2023   CRP 8.0 (H) 02/10/2023   CRP 8.2 (H) 02/09/2023   LABURIC 6.5 02/06/2023   LABURIC 6.7 11/28/2022   LABURIC 6.2 07/04/2022   REPTSTATUS 02/22/2023 FINAL 02/17/2023   CULT  02/17/2023    NO GROWTH 5 DAYS Performed at [facility], [address] [place], [place] [address]    LABORGA ESCHERICHIA COLI (A) 08/15/2021    Lab Results  Component Value Date   ALBUMIN 2.4 (L) 04/30/2023   ALBUMIN 2.5 (L) 04/28/2023   ALBUMIN 2.1 (L) 04/26/2023   LABURIC 6.5 02/06/2023   LABURIC 6.7 11/28/2022   LABURIC 6.2 07/04/2022        Latest Ref Rng & Units 04/30/2023    4:40 AM 04/29/2023    9:00 AM 04/28/2023    4:57 AM  CBC EXTENDED  WBC 4.0 - 10.5 K/uL 10.1  9.5  10.1   RBC 3.87 - 5.11 MIL/uL 2.51  2.56  2.66   Hemoglobin 12.0 - 15.0 g/dL 7.5  7.5  7.9   HCT 52.8 - 46.0 % 23.9  24.3  25.6   Platelets 150 - 400 K/uL 301  304  266   NEUT# 1.7 - 7.7 K/uL 6.4     Lymph# 0.7 - 4.0 K/uL 2.7       Neurologic: Patient does not have  protective sensation bilateral lower extremities.   MUSCULOSKELETAL:   Skin: Examination the right transtibial amputation is well consolidated no ulcers no cellulitis.  Examination the left heel patient has a chronic lateral left heel ulcer.  The ulcer is 1 cm in diameter there is a small amount of clear serous drainage.  There is no cellulitis.  Review of patient's MRI scan in December does show edema around the wound with edema extending down to the calcaneus.  Assessment: Assessment: Stable right transtibial amputation with ulcer lateral left heel with edema lateral aspect of the calcaneus.  Plan: Plan: Will continue with conservative dressing changes with Vashe 4 x 4 gauze to the lateral left heel ulcer.  Once patient is stable with the prosthesis on the right would consider surgical intervention for the left heel.  Thank you for the consult and the opportunity to see Ms. Charmaine Downs, MD [facility] [phone] 9:12 AM

## 2023-04-30 NOTE — Progress Notes (Signed)
 [facility] Progress Note   Subjective:    Seen and examined patient at bedside. Psychiatry following. Ortho consulted for ulcer of left heel. Plan for HD this afternoon.   Objective Vitals:   04/29/23 1347 04/29/23 1947 04/30/23 0428 04/30/23 1238  BP: (!) 134/90 (!) 119/56 (!) 97/47 106/66  Pulse: 83 98 91 93  Resp: 17 18 18    Temp: 97.8 F (36.6 C) (!) 97.4 F (36.3 C) (!) 97.5 F (36.4 C) 98 F (36.7 C)  TempSrc: Oral   Oral  SpO2: 96% 100% 100% 100%  Weight:       Physical Exam General: Chronically ill appearing female in NAD Heart: S1,S2 No M/R/G SR Lungs: CTAB  Abdomen: Obese NABs Extremities: R BKA, trace edema R stump; trace edema LLE; Drsg L foot Dialysis Access: [facility] drsg intact  Filed Weights   04/26/23 1230 04/28/23 1447 04/28/23 1813  Weight: 102.9 kg 101.5 kg 99.7 kg    Intake/Output Summary (Last 24 hours) at 04/30/2023 1329 Last data filed at 04/30/2023 0444 Gross per 24 hour  Intake 470.03 ml  Output 0 ml  Net 470.03 ml    Additional Objective Labs: Basic Metabolic Panel: Recent Labs  Lab 04/26/23 0618 04/26/23 0619 04/28/23 0510 04/29/23 0900 04/30/23 0440  NA 136   < > 139 135 138  K 3.1*   < > 2.9* 3.0* 3.1*  CL 104   < > 103 99 96*  CO2 17*   < > 21* 25 24  GLUCOSE 81   < > 96 93 97  BUN 50*   < > 27* 12 16  CREATININE 8.66*   < > 6.80* 4.70* 5.74*  CALCIUM 8.4*   < > 8.9 8.5* 9.7  PHOS 10.7*  --  7.0*  --  5.5*   < > = values in this interval not displayed.   Liver Function Tests: Recent Labs  Lab 04/25/23 1409 04/26/23 0618 04/26/23 0619 04/28/23 0510 04/30/23 0440  AST 9*  --  9*  --   --   ALT 8  --  7  --   --   ALKPHOS 80  --  78  --   --   BILITOT 0.4  --  0.5  --   --   PROT 6.5  --  6.1*  --   --   ALBUMIN 2.3*   < > 2.1* 2.5* 2.4*   < > = values in this interval not displayed.   Recent Labs  Lab 04/25/23 1409  LIPASE 24   CBC: Recent Labs  Lab 04/25/23 1409 04/25/23 1624  04/26/23 0619 04/28/23 0457 04/29/23 0900 04/30/23 0440  WBC 12.6*  --  12.9* 10.1 9.5 10.1  NEUTROABS  --   --   --   --   --  6.4  HGB 8.2*   < > 8.2* 7.9* 7.5* 7.5*  HCT 27.2*   < > 25.9* 25.6* 24.3* 23.9*  MCV 95.4  --  94.5 96.2 94.9 95.2  PLT 288  --  273 266 304 301   < > = values in this interval not displayed.   Blood Culture    Component Value Date/Time   SDES BLOOD BLOOD RIGHT HAND 02/17/2023 2243   SPECREQUEST  02/17/2023 2243    BOTTLES DRAWN AEROBIC AND ANAEROBIC Blood Culture results may not be optimal due to an inadequate volume of blood received in culture bottles   CULT  02/17/2023 2243    NO GROWTH  5 DAYS Performed at [facility], [address] [place], [place] [address]    REPTSTATUS 02/22/2023 FINAL 02/17/2023 2243    Cardiac Enzymes: No results for input(s): "CKTOTAL", "CKMB", "CKMBINDEX", "TROPONINI" in the last 168 hours. CBG: Recent Labs  Lab 04/29/23 1128 04/29/23 1611 04/29/23 2015 04/30/23 0612 04/30/23 0806  GLUCAP 85 96 124* 89 89   Iron Studies:  Recent Labs    04/28/23 0510  IRON 61  TIBC 147*  FERRITIN 1,349*   Lab Results  Component Value Date   INR 1.5 (H) 03/22/2023   INR 1.9 (H) 12/15/2022   INR 1.8 (H) 11/23/2022   Studies/Results: No results found.  Medications:  famotidine (PEPCID) IV Stopped (04/29/23 1154)    (feeding supplement) PROSource Plus  30 mL Oral BID BM   atorvastatin  10 mg Oral QPM   busPIRone  5 mg Oral TID   ferric citrate  210 mg Oral TID WC   gabapentin  100 mg Oral TID   Gerhardt's butt cream   Topical BID   leptospermum manuka honey  1 Application Topical Daily   magic mouthwash w/lidocaine  5 mL Oral QID   metoprolol tartrate  25 mg Oral BID   midodrine  5 mg Oral TID WC   midodrine  5 mg Oral Once   mirtazapine  15 mg Oral QHS   ondansetron (ZOFRAN) IV  4 mg Intravenous Q8H   pantoprazole (PROTONIX) IV  40 mg Intravenous Q12H   risperiDONE  1 mg Oral QHS   sodium bicarbonate   650 mg Oral BID   sodium chloride flush  10-40 mL Intracatheter Q12H   sodium chloride flush  3 mL Intravenous Q12H   sodium chloride flush  3 mL Intravenous Q12H   torsemide  100 mg Oral Once per day on Sunday Monday Wednesday Friday    Dialysis Orders: [facility] T,TH,S 4 hr 180NRe 400/800 112 kg 3.0 K/2.5 Ca TDC - No Heparin  - Hectorol 5 mcg IV three times per week - Mircera 150 mcg IV q 2 weeks (last dose 04/09/2023)  Assessment/Plan: Volume overload - d/t missing HD which were transportation issues per the pt. Mostly LE edema. Max UF w/ HD.  Left heel ulcer - Seen by Ortho today: continue conservative dressing for now. Once stable with R prosthesis, consider intervention of left heel. Borderline personality trait disorder - Psych consulted ESRD - on HD TTS. Next HD this afternoon. Noted HD machine clotted off 2/20. Do not see any contraindications with using Heparin. Will give 2000 unit bolus with HD today. Okay for patient to have 1 cup hot chocolate daily per patient's request. See below. Hypokalemia-Follow labs. Use 4 K bath. Continue Supplementing with oral K+ if needed.  Hypotension - chronic hypotension it appears. BP here is soft to low-normal. On midodrine 5 tid, takes BB for afib most likely.  Volume - By bed scales, now under OP EDW. Lungs clear, chronic LE edema. Lower EDW if possible on DD. Continue Torsemide on non HD days Anemia of esrd - Hb 8- 9 here. ESA was due 04/23/2023, Given Darbe 200 mcg sQ 04/26/2023. Follow HGB. Transfuse prn.  Secondary hyperparathyroidism - CCa in range, add on phos. Resume binders w/ meals.  Atrial fib - chronic. No anticoagulation.   Salome Holmes, NP [facility] 04/30/2023,1:29 PM  LOS: 5 days

## 2023-04-30 NOTE — Plan of Care (Signed)
  Problem: Education: Goal: Individualized Educational Video(s) Outcome: Progressing   Problem: Fluid Volume: Goal: Ability to maintain a balanced intake and output will improve Outcome: Progressing   Problem: Health Behavior/Discharge Planning: Goal: Ability to identify and utilize available resources and services will improve Outcome: Progressing Goal: Ability to manage health-related needs will improve Outcome: Progressing   Problem: Metabolic: Goal: Ability to maintain appropriate glucose levels will improve Outcome: Progressing   Problem: Nutritional: Goal: Maintenance of adequate nutrition will improve Outcome: Progressing Goal: Progress toward achieving an optimal weight will improve Outcome: Progressing   Problem: Skin Integrity: Goal: Risk for impaired skin integrity will decrease Outcome: Progressing   Problem: Tissue Perfusion: Goal: Adequacy of tissue perfusion will improve Outcome: Progressing   Problem: Education: Goal: Knowledge of General Education information will improve Description: Including pain rating scale, medication(s)/side effects and non-pharmacologic comfort measures Outcome: Progressing   Problem: Health Behavior/Discharge Planning: Goal: Ability to manage health-related needs will improve Outcome: Progressing   Problem: Clinical Measurements: Goal: Ability to maintain clinical measurements within normal limits will improve Outcome: Progressing Goal: Will remain free from infection Outcome: Progressing Goal: Diagnostic test results will improve Outcome: Progressing Goal: Respiratory complications will improve Outcome: Progressing Goal: Cardiovascular complication will be avoided Outcome: Progressing   Problem: Activity: Goal: Risk for activity intolerance will decrease Outcome: Progressing   Problem: Nutrition: Goal: Adequate nutrition will be maintained Outcome: Progressing   Problem: Coping: Goal: Level of anxiety will  decrease Outcome: Progressing   Problem: Elimination: Goal: Will not experience complications related to bowel motility Outcome: Progressing Goal: Will not experience complications related to urinary retention Outcome: Progressing   Problem: Pain Managment: Goal: General experience of comfort will improve and/or be controlled Outcome: Progressing   Problem: Safety: Goal: Ability to remain free from injury will improve Outcome: Progressing   Problem: Skin Integrity: Goal: Risk for impaired skin integrity will decrease Outcome: Progressing

## 2023-04-30 NOTE — Plan of Care (Signed)
   Problem: Coping: Goal: Ability to adjust to condition or change in health will improve Outcome: Progressing   Problem: Health Behavior/Discharge Planning: Goal: Ability to identify and utilize available resources and services will improve Outcome: Progressing   Problem: Metabolic: Goal: Ability to maintain appropriate glucose levels will improve Outcome: Progressing   Problem: Safety: Goal: Ability to remain free from injury will improve Outcome: Progressing   Problem: Skin Integrity: Goal: Risk for impaired skin integrity will decrease Outcome: Progressing

## 2023-04-30 NOTE — Progress Notes (Signed)
 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  Also reports poor oral intake in the setting of nausea, vomiting and diarrhea for 2 days.  She lives at home by herself and endorses she does not have anyone to help her around.  She is not able to transfer using [facility] lift.  She was found to have potassium of 2.9, BUN 46, bicarb 19, troponin 23--- 16.  CT chest abdomen and pelvis without contrast no acute chest abdomen and pelvis abnormality.  Scattered coronary artery disease, scattered groundglass opacity of the lung favor atelectasis.  Fibroid uterus.  Nephrology was consulted patient was admitted for hemodialysis and further care.    Assessment & Plan:   Principal Problem:   Volume overload Active Problems:   Chronic hypokalemia   Chronic diarrhea   ESRD on dialysis (HCC)   Anemia of chronic disease   Paroxysmal atrial fibrillation (HCC)   GERD (gastroesophageal reflux disease)   Insulin dependent type 2 diabetes mellitus (HCC)   Chronic gout   Gastroparesis   Chronic depression   NASH (nonalcoholic steatohepatitis)   Chronic pain syndrome   Hx of BKA, right (HCC)   Chronic diastolic CHF (congestive heart failure) (HCC)   Carcinoid tumor of abdomen   PVD (peripheral vascular disease) (HCC)   History of GI bleed   History of arteriovenous  malformation (AVM)   Generalized anxiety disorder   History of reactive airway disease   History of spinal stenosis   Chronic diabetic ulcer of left foot determined by examination [facility])   Dysphagia   Amputation of right lower extremity below knee (HCC)   Non-pressure chronic ulcer of left heel and midfoot limited to breakdown of skin (HCC)   1-Volume overload secondary to missing hemodialysis Acute on chronic diastolic heart failure CT chest with scattered groundglass opacity ESRD on hemodialysis, noncompliance due to transportation issues Volume managed with hemodialysis.  Nephrology consulted had hemodialysis 12/18. Next HD 2/20. Stable   Chronic hypotension: Continue with midodrine  Chest pain/abdominal pain, nausea vomiting Reports that oral Dilaudid has not been helping with her pain.  She usually required few days of IV pain medication. CT abdomen and pelvis with no acute findings IV Protonix. She has a history of carcinoid tumor Underwent endoscopy 2/20: nodular mucosa duodenum, Normal esophagus dilated, retained gastric fluid.  Continue with schedule Zofran. IV dilaudid for 24 hr  Nausea vomiting diarrhea: Suspect related to duodenal carcinoid tumor GI has been consulted Carcinoid,gastroparesis.  Dysphagia: Report inability to swallow pills and reports something got stuck GI has been consulted. Underwent endoscopy, esophagus normal, dilated.  Schedule zofran  Anemia of chronic disease secondary to ESRD: Stable. Monitor hb  IDDM-2: A1c 5.7% on 11/29.  Monitor  CBG     Paroxysmal atrial fibrillation: Not on anticoagulation due to prior GI bleed -Continue metoprolol 25 mg twice daily. -Optimize electrolytes.   GERD/history of GIB/AVM -Continue Protonix and pain present   Anxiety/depression/chronic pain  syndrome: -Continue home meds-BuSpar, Atarax, gabapentin Psych consulted,reported suicidal thought to SW Patient have been clear for discharge to SNF per  psych. Does not need inpatient psychiatric facility admission Continue with Remeron and risperidone  Gastroparesis: Unfortunately allergic to Reglan.  Also on opiate which might not be a good choice.  Wean IV pain meds.   NASH (nonalcoholic steatohepatitis) -Continue to monitor hepatic function panel.   Right BKA/ambulatory dysfunction: Patient reports using [facility] lift for transfer.  Sitting on bedside during my evaluation. -PT/OT eval   Carcinoid tumor of abdomen/chronic diarrhea: Reports 2-3 BMs daily at baseline. -Continue loperamide.   PVD (peripheral vascular disease) (HCC) -Continue Lipitor   History of reactive airway disease: Stable. - continue Xopenex as needed    History of spinal stenosis with associated chronic pain syndrome -Continue gabapentin, Robaxin Dilaudid as needed   Chronic diabetic ulcer of left foot determined by examination (HCC) -Wound care consulted.  -Appreciate Dr Floy Sabina care  Morbid obesity: Elevated BMI with comorbidity as above Body mass index is 38.04 kg/m.        Pressure Injury 11/23/22 Ischial tuberosity Left Unstageable - Full thickness tissue loss in which the base of the injury is covered by slough (yellow, tan, gray, green or brown) and/or eschar (tan, brown or black) in the wound bed. (Active)  11/23/22 0853  Location: Ischial tuberosity  Location Orientation: Left  Staging: Unstageable - Full thickness tissue loss in which the base of the injury is covered by slough (yellow, tan, gray, green or brown) and/or eschar (tan, brown or black) in the wound bed.  Wound Description (Comments):   Present on Admission: Yes     Pressure Injury 11/24/22 Ischial tuberosity Right Stage 2 -  Partial thickness loss of dermis presenting as a shallow open injury with a red, pink wound bed without slough. stage two injury to upper thigh into groin region. (Active)  11/24/22 1452  Location: Ischial tuberosity  Location Orientation: Right   Staging: Stage 2 -  Partial thickness loss of dermis presenting as a shallow open injury with a red, pink wound bed without slough.  Wound Description (Comments): stage two injury to upper thigh into groin region.  Present on Admission: Yes                  Estimated body mass index is 35.48 kg/m as calculated from the following:   Height as of 03/22/23: 5\' 6"  (1.676 m).   Weight as of this encounter: 99.7 kg.   DVT prophylaxis: SCD Code Status: Full code Family Communication: Disposition Plan:  Status is: Inpatient Remains inpatient appropriate because: management of pain     Consultants:  GI Nephrology   Procedures:  HD   Antimicrobials:    Subjective She report unable to swallow pill or eat   Objective: Vitals:   04/29/23 0800 04/29/23 1347 04/29/23 1947 04/30/23 0428  BP: 107/61 (!) 134/90 (!) 119/56 (!) 97/47  Pulse: 91 83 98 91  Resp: 18 17 18 18   Temp: 97.9 F (36.6 C) 97.8 F (36.6 C) (!) 97.4 F (36.3 C) (!) 97.5 F (36.4 C)  TempSrc: Oral Oral    SpO2: 100% 96% 100% 100%  Weight:        Intake/Output Summary (Last 24 hours) at 04/30/2023 1223 Last data filed at 04/30/2023 0444 Gross per 24 hour  Intake 470.03 ml  Output 0 ml  Net 470.03 ml    Filed Weights   04/26/23 1230 04/28/23 1447 04/28/23  1813  Weight: 102.9 kg 101.5 kg 99.7 kg    Examination:  General exam: NAD Respiratory system: CTA Cardiovascular system: S 1, S 2 RRR Gastrointestinal system: BS present,soft,nt Central nervous system: alert Data Reviewed: I have personally reviewed following labs and imaging studies  CBC: Recent Labs  Lab 04/25/23 1409 04/25/23 1624 04/26/23 0619 04/28/23 0457 04/29/23 0900 04/30/23 0440  WBC 12.6*  --  12.9* 10.1 9.5 10.1  NEUTROABS  --   --   --   --   --  6.4  HGB 8.2* 9.2* 8.2* 7.9* 7.5* 7.5*  HCT 27.2* 27.0* 25.9* 25.6* 24.3* 23.9*  MCV 95.4  --  94.5 96.2 94.9 95.2  PLT 288  --  273 266 304 301   Basic Metabolic  Panel: Recent Labs  Lab 04/26/23 0618 04/26/23 0619 04/28/23 0510 04/29/23 0900 04/30/23 0440  NA 136 135 139 135 138  K 3.1* 3.0* 2.9* 3.0* 3.1*  CL 104 105 103 99 96*  CO2 17* 17* 21* 25 24  GLUCOSE 81 90 96 93 97  BUN 50* 48* 27* 12 16  CREATININE 8.66* 8.88* 6.80* 4.70* 5.74*  CALCIUM 8.4* 8.5* 8.9 8.5* 9.7  MG  --   --  2.1  --   --   PHOS 10.7*  --  7.0*  --  5.5*   GFR: Estimated Creatinine Clearance: 12.6 mL/min (A) (by C-G formula based on SCr of 5.74 mg/dL (H)). Liver Function Tests: Recent Labs  Lab 04/25/23 1409 04/26/23 0618 04/26/23 0619 04/28/23 0510 04/30/23 0440  AST 9*  --  9*  --   --   ALT 8  --  7  --   --   ALKPHOS 80  --  78  --   --   BILITOT 0.4  --  0.5  --   --   PROT 6.5  --  6.1*  --   --   ALBUMIN 2.3* 2.2* 2.1* 2.5* 2.4*   Recent Labs  Lab 04/25/23 1409  LIPASE 24   No results for input(s): "AMMONIA" in the last 168 hours. Coagulation Profile: No results for input(s): "INR", "PROTIME" in the last 168 hours. Cardiac Enzymes: No results for input(s): "CKTOTAL", "CKMB", "CKMBINDEX", "TROPONINI" in the last 168 hours. BNP (last 3 results) No results for input(s): "PROBNP" in the last 8760 hours. HbA1C: No results for input(s): "HGBA1C" in the last 72 hours. CBG: Recent Labs  Lab 04/29/23 1128 04/29/23 1611 04/29/23 2015 04/30/23 0612 04/30/23 0806  GLUCAP 85 96 124* 89 89   Lipid Profile: No results for input(s): "CHOL", "HDL", "LDLCALC", "TRIG", "CHOLHDL", "LDLDIRECT" in the last 72 hours. Thyroid Function Tests: No results for input(s): "TSH", "T4TOTAL", "FREET4", "T3FREE", "THYROIDAB" in the last 72 hours. Anemia Panel: Recent Labs    04/28/23 0510  VITAMINB12 392  FOLATE 8.2  FERRITIN 1,349*  TIBC 147*  IRON 61   Sepsis Labs: No results for input(s): "PROCALCITON", "LATICACIDVEN" in the last 168 hours.  No results found for this or any previous visit (from the past 240 hours).       Radiology  Studies: No results found.       Scheduled Meds:  (feeding supplement) PROSource Plus  30 mL Oral BID BM   atorvastatin  10 mg Oral QPM   busPIRone  5 mg Oral TID   ferric citrate  210 mg Oral TID WC   gabapentin  100 mg Oral TID   Gerhardt's butt cream   Topical BID  leptospermum manuka honey  1 Application Topical Daily   magic mouthwash w/lidocaine  5 mL Oral QID   metoprolol tartrate  25 mg Oral BID   midodrine  5 mg Oral TID WC   midodrine  5 mg Oral Once   mirtazapine  15 mg Oral QHS   ondansetron (ZOFRAN) IV  4 mg Intravenous Q8H   pantoprazole (PROTONIX) IV  40 mg Intravenous Q12H   risperiDONE  1 mg Oral QHS   sodium bicarbonate  650 mg Oral BID   sodium chloride flush  10-40 mL Intracatheter Q12H   sodium chloride flush  3 mL Intravenous Q12H   sodium chloride flush  3 mL Intravenous Q12H   torsemide  100 mg Oral Once per day on Sunday Monday Wednesday Friday   Continuous Infusions:  famotidine (PEPCID) IV Stopped (04/29/23 1154)     LOS: 5 days    Time spent: 35 minutes    Shandora Koogler A Margeart Allender, MD [facility]   If 7PM-7AM, please contact night-coverage [email or web address]  04/30/2023, 12:23 PM

## 2023-05-01 ENCOUNTER — Encounter (HOSPITAL_COMMUNITY): Payer: Self-pay | Admitting: Internal Medicine

## 2023-05-01 DIAGNOSIS — R52 Pain, unspecified: Secondary | ICD-10-CM | POA: Diagnosis not present

## 2023-05-01 LAB — GASTROINTESTINAL PANEL BY PCR, STOOL (REPLACES STOOL CULTURE)

## 2023-05-01 LAB — CBC
HCT: 22.9 % — ABNORMAL LOW (ref 36.0–46.0)
Hemoglobin: 7 g/dL — ABNORMAL LOW (ref 12.0–15.0)
MCH: 29.5 pg (ref 26.0–34.0)
MCHC: 30.6 g/dL (ref 30.0–36.0)
MCV: 96.6 fL (ref 80.0–100.0)
Platelets: 268 10*3/uL (ref 150–400)
RBC: 2.37 MIL/uL — ABNORMAL LOW (ref 3.87–5.11)
RDW: 14.9 % (ref 11.5–15.5)
WBC: 9.5 10*3/uL (ref 4.0–10.5)
nRBC: 0 % (ref 0.0–0.2)

## 2023-05-01 LAB — GLUCOSE, CAPILLARY
Glucose-Capillary: 98 mg/dL (ref 70–99)
Glucose-Capillary: 109 mg/dL — ABNORMAL HIGH (ref 70–99)
Glucose-Capillary: 195 mg/dL — ABNORMAL HIGH (ref 70–99)
Glucose-Capillary: 184 mg/dL — ABNORMAL HIGH (ref 70–99)

## 2023-05-01 MED ORDER — MIDODRINE HCL 5 MG PO TABS
10.0000 mg | ORAL_TABLET | Freq: Three times a day (TID) | ORAL | Status: DC
  Administered 2023-05-01 – 2023-05-04 (×7): 10 mg via ORAL
  Filled 2023-05-01 (×8): qty 2

## 2023-05-01 MED ORDER — RISPERIDONE 1 MG PO TBDP
1.0000 mg | ORAL_TABLET | Freq: Every day | ORAL | Status: DC
  Administered 2023-05-01 – 2023-05-03 (×3): 1 mg via ORAL
  Filled 2023-05-01 (×5): qty 1

## 2023-05-01 MED ORDER — HYDROMORPHONE HCL 1 MG/ML IJ SOLN
0.5000 mg | Freq: Three times a day (TID) | INTRAMUSCULAR | Status: DC | PRN
  Administered 2023-05-01 – 2023-05-02 (×3): 0.5 mg via INTRAVENOUS
  Filled 2023-05-01 (×3): qty 0.5

## 2023-05-01 NOTE — Progress Notes (Addendum)
 [facility] Progress Note   Subjective:    Seen and examined patient at bedside. Noted her [facility] wasn't working well with yesterday's HD. Apparently, the access was twisting around as she was moving in bed causing the machine to alarm. A rentention suture was recommended. Noted net UF 1.1L.   Objective Vitals:   04/30/23 1942 04/30/23 2024 04/30/23 2311 05/01/23 0534  BP:  97/73 112/68 (!) 105/51  Pulse:  88  96  Resp:  16    Temp:  98 F (36.7 C)  98.1 F (36.7 C)  TempSrc:    Oral  SpO2:  99%  100%  Weight: 97.6 kg      Physical Exam General: Chronically ill appearing female in NAD Heart: S1,S2 No M/R/G SR Lungs: CTAB  Abdomen: Obese NABs Extremities: R BKA, trace edema R stump; trace edema LLE; Drsg L foot Dialysis Access: [facility] drsg intact  Filed Weights   04/28/23 1813 04/30/23 1454 04/30/23 1942  Weight: 99.7 kg 98.7 kg 97.6 kg    Intake/Output Summary (Last 24 hours) at 05/01/2023 1356 Last data filed at 05/01/2023 0600 Gross per 24 hour  Intake 240 ml  Output 1100 ml  Net -860 ml    Additional Objective Labs: Basic Metabolic Panel: Recent Labs  Lab 04/26/23 0618 04/26/23 0619 04/28/23 0510 04/29/23 0900 04/30/23 0440  NA 136   < > 139 135 138  K 3.1*   < > 2.9* 3.0* 3.1*  CL 104   < > 103 99 96*  CO2 17*   < > 21* 25 24  GLUCOSE 81   < > 96 93 97  BUN 50*   < > 27* 12 16  CREATININE 8.66*   < > 6.80* 4.70* 5.74*  CALCIUM 8.4*   < > 8.9 8.5* 9.7  PHOS 10.7*  --  7.0*  --  5.5*   < > = values in this interval not displayed.   Liver Function Tests: Recent Labs  Lab 04/25/23 1409 04/26/23 0618 04/26/23 0619 04/28/23 0510 04/30/23 0440  AST 9*  --  9*  --   --   ALT 8  --  7  --   --   ALKPHOS 80  --  78  --   --   BILITOT 0.4  --  0.5  --   --   PROT 6.5  --  6.1*  --   --   ALBUMIN 2.3*   < > 2.1* 2.5* 2.4*   < > = values in this interval not displayed.   Recent Labs  Lab 04/25/23 1409  LIPASE 24   CBC: Recent Labs   Lab 04/26/23 0619 04/28/23 0457 04/29/23 0900 04/30/23 0440 05/01/23 0818  WBC 12.9* 10.1 9.5 10.1 9.5  NEUTROABS  --   --   --  6.4  --   HGB 8.2* 7.9* 7.5* 7.5* 7.0*  HCT 25.9* 25.6* 24.3* 23.9* 22.9*  MCV 94.5 96.2 94.9 95.2 96.6  PLT 273 266 304 301 268   Blood Culture    Component Value Date/Time   SDES BLOOD BLOOD RIGHT HAND 02/17/2023 2243   SPECREQUEST  02/17/2023 2243    BOTTLES DRAWN AEROBIC AND ANAEROBIC Blood Culture results may not be optimal due to an inadequate volume of blood received in culture bottles   CULT  02/17/2023 2243    NO GROWTH 5 DAYS Performed at [facility], [address], [place], [place] [address]    REPTSTATUS 02/22/2023 FINAL  02/17/2023 2243    Cardiac Enzymes: No results for input(s): "CKTOTAL", "CKMB", "CKMBINDEX", "TROPONINI" in the last 168 hours. CBG: Recent Labs  Lab 04/30/23 0612 04/30/23 0806 04/30/23 2110 05/01/23 0822 05/01/23 1106  GLUCAP 89 89 118* 98 109*   Iron Studies: No results for input(s): "IRON", "TIBC", "TRANSFERRIN", "FERRITIN" in the last 72 hours. Lab Results  Component Value Date   INR 1.5 (H) 03/22/2023   INR 1.9 (H) 12/15/2022   INR 1.8 (H) 11/23/2022   Studies/Results: No results found.  Medications:  famotidine (PEPCID) IV Stopped (04/29/23 1154)    (feeding supplement) PROSource Plus  30 mL Oral BID BM   atorvastatin  10 mg Oral QPM   busPIRone  5 mg Oral TID   ferric citrate  210 mg Oral TID WC   Gerhardt's butt cream   Topical BID   leptospermum manuka honey  1 Application Topical Daily   magic mouthwash w/lidocaine  5 mL Oral QID   metoprolol tartrate  25 mg Oral BID   midodrine  5 mg Oral TID WC   mirtazapine  15 mg Oral QHS   ondansetron (ZOFRAN) IV  4 mg Intravenous Q8H   pantoprazole (PROTONIX) IV  40 mg Intravenous Q12H   risperiDONE  1 mg Oral QHS   sodium bicarbonate  650 mg Oral BID   sodium chloride flush  10-40 mL Intracatheter Q12H   sodium chloride flush  3 mL  Intravenous Q12H   sodium chloride flush  3 mL Intravenous Q12H    Dialysis Orders: [facility] T,TH,S 4 hr 180NRe 400/800 112 kg 3.0 K/2.5 Ca TDC - No Heparin  - Hectorol 5 mcg IV three times per week - Mircera 150 mcg IV q 2 weeks (last dose 04/09/2023)   Assessment/Plan: Volume overload - d/t missing HD which were transportation issues per the pt. Mostly LE edema. Max UF w/ HD.  Left heel ulcer - Seen by Ortho today: continue conservative dressing for now. Once stable with R prosthesis, consider intervention of left heel. Borderline personality trait disorder - Psych consulted ESRD - on HD TTS. Next HD 2/25. HD machine clotted off 2/20. Do not see any contraindications with using Heparin. Ordered Heparin bolus 2000 units. Okay for patient to have 1 cup hot chocolate daily per patient's request. Vascular Access - Noted patient's TDC was twisting during yesterday's HD causing the machine to alarm. HD RN recommended a retention suture to be applied. Plan to reach to IR to have them take a look tomorrow. Hypokalemia-Follow labs. Use 4 K bath. Continue Supplementing with oral K+ if needed.  Hypotension - chronic hypotension it appears. BP here is soft to low-normal. Raised midodrine to 10 tid as her Bps were dropping during HD yesterday, takes BB for afib most likely.  Volume - By bed scales, now under OP EDW. Lungs clear, chronic LE edema. Lower EDW if possible on DD. Continue Torsemide on non HD days Anemia of esrd - Hb now 7. Given Darbe 200 mcg SQ due on 2/25. Follow HGB. Transfuse for Hgb < 7. Checking labs in AM. Secondary hyperparathyroidism - CCa and phos okay. Resume binders w/ meals.  Atrial fib - chronic. No anticoagulation.    Salome Holmes, NP [facility] 05/01/2023,1:56 PM  LOS: 6 days

## 2023-05-01 NOTE — Progress Notes (Signed)
 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  Also reports poor oral intake in the setting of nausea, vomiting and diarrhea for 2 days.  She lives at home by herself and endorses she does not have anyone to help her around.  She is not able to transfer using [facility] lift.  She was found to have potassium of 2.9, BUN 46, bicarb 19, troponin 23--- 16.  CT chest abdomen and pelvis without contrast no acute chest abdomen and pelvis abnormality.  Scattered coronary artery disease, scattered groundglass opacity of the lung favor atelectasis.  Fibroid uterus.  Nephrology was consulted patient was admitted for hemodialysis and further care.    Assessment & Plan:   Principal Problem:   Volume overload Active Problems:   Chronic hypokalemia   Chronic diarrhea   ESRD on dialysis (HCC)   Anemia of chronic disease   Paroxysmal atrial fibrillation (HCC)   GERD (gastroesophageal reflux disease)   Insulin dependent type 2 diabetes mellitus (HCC)   Chronic gout   Gastroparesis   Chronic depression   NASH (nonalcoholic steatohepatitis)   Chronic pain syndrome   Hx of BKA, right (HCC)   Chronic diastolic CHF (congestive heart failure) (HCC)   Carcinoid tumor of abdomen   PVD (peripheral vascular disease) (HCC)   History of GI bleed   History of arteriovenous  malformation (AVM)   Generalized anxiety disorder   History of reactive airway disease   History of spinal stenosis   Chronic diabetic ulcer of left foot determined by examination [facility])   Dysphagia   Amputation of right lower extremity below knee (HCC)   Non-pressure chronic ulcer of left heel and midfoot limited to breakdown of skin (HCC)   1-Volume overload secondary to missing hemodialysis Acute on chronic diastolic heart failure CT chest with scattered groundglass opacity ESRD on hemodialysis, noncompliance due to transportation issues Volume managed with hemodialysis.  Nephrology consulted had hemodialysis 2/18. Next HD 2/20. HD tomorrow 2/25 Stable   Chronic hypotension: Continue with midodrine.  Chest pain/abdominal pain, nausea vomiting Reports that oral Dilaudid has not been helping with her pain.  She usually required few days of IV pain medication. CT abdomen and pelvis with no acute findings IV Protonix. She has a history of carcinoid tumor Underwent endoscopy 2/20: nodular mucosa duodenum, Normal esophagus dilated, retained gastric fluid.  Continue with schedule Zofran. Discussed with patient, plan to change dilaudid to Q 8 hours,. GI recommend weaning opioid to help with gastroparesis.  Nausea vomiting diarrhea: Suspect related to duodenal carcinoid tumor GI has been consulted Carcinoid,gastroparesis.  Dysphagia: Report inability to swallow pills and reports something got stuck GI has been consulted. Underwent endoscopy, esophagus normal, dilated.  Schedule zofran  Anemia of chronic disease secondary to ESRD: Stable. Monitor hb  IDDM-2: A1c 5.7% on 11/29.  Monitor  CBG     Paroxysmal atrial fibrillation: Not on anticoagulation due to prior GI bleed -Continue metoprolol 25 mg twice  daily. -Optimize electrolytes.   GERD/history of GIB/AVM -Continue Protonix and pain present   Anxiety/depression/chronic pain syndrome: -Continue home meds-BuSpar, Atarax,  gabapentin Psych consulted,reported suicidal thought to SW Patient have been clear for discharge to SNF per psych. Does not need inpatient psychiatric facility admission Continue with Remeron and risperidone  Gastroparesis: Unfortunately allergic to Reglan.  Also on opiate which might not be a good choice.  Wean IV pain meds.   NASH (nonalcoholic steatohepatitis) -Continue to monitor hepatic function panel.   Right BKA/ambulatory dysfunction: Patient reports using [facility] lift for transfer.  Sitting on bedside during my evaluation. -PT/OT eval   Carcinoid tumor of abdomen/chronic diarrhea: Reports 2-3 BMs daily at baseline. -Continue loperamide.   PVD (peripheral vascular disease) (HCC) -Continue Lipitor   History of reactive airway disease: Stable. - continue Xopenex as needed    History of spinal stenosis with associated chronic pain syndrome -Continue gabapentin, Robaxin Dilaudid as needed   Chronic diabetic ulcer of left foot determined by examination (HCC) -Wound care consulted.  -Appreciate Dr Floy Sabina care  Morbid obesity: Elevated BMI with comorbidity as above Body mass index is 38.04 kg/m.        Pressure Injury 11/23/22 Ischial tuberosity Left Unstageable - Full thickness tissue loss in which the base of the injury is covered by slough (yellow, tan, gray, green or brown) and/or eschar (tan, brown or black) in the wound bed. (Active)  11/23/22 0853  Location: Ischial tuberosity  Location Orientation: Left  Staging: Unstageable - Full thickness tissue loss in which the base of the injury is covered by slough (yellow, tan, gray, green or brown) and/or eschar (tan, brown or black) in the wound bed.  Wound Description (Comments):   Present on Admission: Yes     Pressure Injury 11/24/22 Ischial tuberosity Right Stage 2 -  Partial thickness loss of dermis presenting as a shallow open injury with a red, pink wound bed without slough. stage two injury to upper thigh  into groin region. (Active)  11/24/22 1452  Location: Ischial tuberosity  Location Orientation: Right  Staging: Stage 2 -  Partial thickness loss of dermis presenting as a shallow open injury with a red, pink wound bed without slough.  Wound Description (Comments): stage two injury to upper thigh into groin region.  Present on Admission: Yes                  Estimated body mass index is 34.73 kg/m as calculated from the following:   Height as of 03/22/23: 5\' 6"  (1.676 m).   Weight as of this encounter: 97.6 kg.   DVT prophylaxis: SCD Code Status: Full code Family Communication: Disposition Plan:  Status is: Inpatient Remains inpatient appropriate because: management of pain     Consultants:  GI Nephrology   Procedures:  HD   Antimicrobials:    Subjective She has been able to tolerates some oral intake. Vomiting less  Objective: Vitals:   04/30/23 2024 04/30/23 2311 05/01/23 0534 05/01/23 1415  BP: 97/73 112/68 (!) 105/51 (!) 105/55  Pulse: 88  96 83  Resp: 16     Temp: 98 F (36.7 C)  98.1 F (36.7 C) 98.2 F (36.8 C)  TempSrc:   Oral Oral  SpO2: 99%  100% 100%  Weight:        Intake/Output Summary (Last 24 hours) at 05/01/2023 1437 Last data filed at 05/01/2023 0600 Gross per 24 hour  Intake 240 ml  Output 1100 ml  Net -860  ml    Filed Weights   04/28/23 1813 04/30/23 1454 04/30/23 1942  Weight: 99.7 kg 98.7 kg 97.6 kg    Examination:  General exam: NAD Respiratory system: CTA Cardiovascular system: S 1, S 2 RRR Gastrointestinal system: BS present, soft, nt Central nervous system: Alert Data Reviewed: I have personally reviewed following labs and imaging studies  CBC: Recent Labs  Lab 04/26/23 0619 04/28/23 0457 04/29/23 0900 04/30/23 0440 05/01/23 0818  WBC 12.9* 10.1 9.5 10.1 9.5  NEUTROABS  --   --   --  6.4  --   HGB 8.2* 7.9* 7.5* 7.5* 7.0*  HCT 25.9* 25.6* 24.3* 23.9* 22.9*  MCV 94.5 96.2 94.9 95.2 96.6  PLT 273  266 304 301 268   Basic Metabolic Panel: Recent Labs  Lab 04/26/23 0618 04/26/23 0619 04/28/23 0510 04/29/23 0900 04/30/23 0440  NA 136 135 139 135 138  K 3.1* 3.0* 2.9* 3.0* 3.1*  CL 104 105 103 99 96*  CO2 17* 17* 21* 25 24  GLUCOSE 81 90 96 93 97  BUN 50* 48* 27* 12 16  CREATININE 8.66* 8.88* 6.80* 4.70* 5.74*  CALCIUM 8.4* 8.5* 8.9 8.5* 9.7  MG  --   --  2.1  --   --   PHOS 10.7*  --  7.0*  --  5.5*   GFR: Estimated Creatinine Clearance: 12.4 mL/min (A) (by C-G formula based on SCr of 5.74 mg/dL (H)). Liver Function Tests: Recent Labs  Lab 04/25/23 1409 04/26/23 0618 04/26/23 0619 04/28/23 0510 04/30/23 0440  AST 9*  --  9*  --   --   ALT 8  --  7  --   --   ALKPHOS 80  --  78  --   --   BILITOT 0.4  --  0.5  --   --   PROT 6.5  --  6.1*  --   --   ALBUMIN 2.3* 2.2* 2.1* 2.5* 2.4*   Recent Labs  Lab 04/25/23 1409  LIPASE 24   No results for input(s): "AMMONIA" in the last 168 hours. Coagulation Profile: No results for input(s): "INR", "PROTIME" in the last 168 hours. Cardiac Enzymes: No results for input(s): "CKTOTAL", "CKMB", "CKMBINDEX", "TROPONINI" in the last 168 hours. BNP (last 3 results) No results for input(s): "PROBNP" in the last 8760 hours. HbA1C: No results for input(s): "HGBA1C" in the last 72 hours. CBG: Recent Labs  Lab 04/30/23 0612 04/30/23 0806 04/30/23 2110 05/01/23 0822 05/01/23 1106  GLUCAP 89 89 118* 98 109*   Lipid Profile: No results for input(s): "CHOL", "HDL", "LDLCALC", "TRIG", "CHOLHDL", "LDLDIRECT" in the last 72 hours. Thyroid Function Tests: No results for input(s): "TSH", "T4TOTAL", "FREET4", "T3FREE", "THYROIDAB" in the last 72 hours. Anemia Panel: No results for input(s): "VITAMINB12", "FOLATE", "FERRITIN", "TIBC", "IRON", "RETICCTPCT" in the last 72 hours.  Sepsis Labs: No results for input(s): "PROCALCITON", "LATICACIDVEN" in the last 168 hours.  Recent Results (from the past 240 hours)  Gastrointestinal  Panel by PCR , Stool     Status: None   Collection Time: 05/01/23 12:07 AM   Specimen: Stool  Result Value Ref Range Status   Campylobacter species NOT DETECTED NOT DETECTED Final   Plesimonas shigelloides NOT DETECTED NOT DETECTED Final   Salmonella species NOT DETECTED NOT DETECTED Final   Yersinia enterocolitica NOT DETECTED NOT DETECTED Final   Vibrio species NOT DETECTED NOT DETECTED Final   Vibrio cholerae NOT DETECTED NOT DETECTED Final   Enteroaggregative E coli (  EAEC) NOT DETECTED NOT DETECTED Final   Enteropathogenic E coli (EPEC) NOT DETECTED NOT DETECTED Final   Enterotoxigenic E coli (ETEC) NOT DETECTED NOT DETECTED Final   Shiga like toxin producing E coli (STEC) NOT DETECTED NOT DETECTED Final   Shigella/Enteroinvasive E coli (EIEC) NOT DETECTED NOT DETECTED Final   Cryptosporidium NOT DETECTED NOT DETECTED Final   Cyclospora cayetanensis NOT DETECTED NOT DETECTED Final   Entamoeba histolytica NOT DETECTED NOT DETECTED Final   Giardia lamblia NOT DETECTED NOT DETECTED Final   Adenovirus F40/41 NOT DETECTED NOT DETECTED Final   Astrovirus NOT DETECTED NOT DETECTED Final   Norovirus GI/GII NOT DETECTED NOT DETECTED Final   Rotavirus A NOT DETECTED NOT DETECTED Final   Sapovirus (I, II, IV, and V) NOT DETECTED NOT DETECTED Final    Comment: Performed at [facility], [address], [place], [place] [address]         Radiology Studies: No results found.       Scheduled Meds:  (feeding supplement) PROSource Plus  30 mL Oral BID BM   atorvastatin  10 mg Oral QPM   busPIRone  5 mg Oral TID   ferric citrate  210 mg Oral TID WC   Gerhardt's butt cream   Topical BID   leptospermum manuka honey  1 Application Topical Daily   magic mouthwash w/lidocaine  5 mL Oral QID   metoprolol tartrate  25 mg Oral BID   midodrine  10 mg Oral TID WC   mirtazapine  15 mg Oral QHS   ondansetron (ZOFRAN) IV  4 mg Intravenous Q8H   pantoprazole (PROTONIX) IV  40 mg  Intravenous Q12H   risperiDONE  1 mg Oral QHS   sodium chloride flush  10-40 mL Intracatheter Q12H   sodium chloride flush  3 mL Intravenous Q12H   sodium chloride flush  3 mL Intravenous Q12H   Continuous Infusions:  famotidine (PEPCID) IV Stopped (04/29/23 1154)     LOS: 6 days    Time spent: 35 minutes    Meiling Hendriks A Sufyaan Palma, MD [facility]   If 7PM-7AM, please contact night-coverage [email or web address]  05/01/2023, 2:37 PM

## 2023-05-02 ENCOUNTER — Ambulatory Visit: Payer: 59

## 2023-05-02 ENCOUNTER — Emergency Department (HOSPITAL_COMMUNITY): Payer: 59

## 2023-05-02 DIAGNOSIS — R52 Pain, unspecified: Secondary | ICD-10-CM | POA: Diagnosis not present

## 2023-05-02 LAB — CBC WITH DIFFERENTIAL/PLATELET
Abs Immature Granulocytes: 0.05 10*3/uL (ref 0.00–0.07)
Basophils Absolute: 0.1 10*3/uL (ref 0.0–0.1)
Basophils Relative: 1 %
Eosinophils Absolute: 0.3 10*3/uL (ref 0.0–0.5)
Eosinophils Relative: 3 %
HCT: 27.8 % — ABNORMAL LOW (ref 36.0–46.0)
Hemoglobin: 8.2 g/dL — ABNORMAL LOW (ref 12.0–15.0)
Immature Granulocytes: 0 %
Lymphocytes Relative: 31 %
Lymphs Abs: 3.6 10*3/uL (ref 0.7–4.0)
MCH: 29.6 pg (ref 26.0–34.0)
MCHC: 29.5 g/dL — ABNORMAL LOW (ref 30.0–36.0)
MCV: 100.4 fL — ABNORMAL HIGH (ref 80.0–100.0)
Monocytes Absolute: 0.7 10*3/uL (ref 0.1–1.0)
Monocytes Relative: 6 %
Neutro Abs: 7.1 10*3/uL (ref 1.7–7.7)
Neutrophils Relative %: 59 %
Platelets: 333 10*3/uL (ref 150–400)
RBC: 2.77 MIL/uL — ABNORMAL LOW (ref 3.87–5.11)
RDW: 14.7 % (ref 11.5–15.5)
WBC: 11.8 10*3/uL — ABNORMAL HIGH (ref 4.0–10.5)
nRBC: 0 % (ref 0.0–0.2)

## 2023-05-02 LAB — GLUCOSE, CAPILLARY
Glucose-Capillary: 142 mg/dL — ABNORMAL HIGH (ref 70–99)
Glucose-Capillary: 209 mg/dL — ABNORMAL HIGH (ref 70–99)
Glucose-Capillary: 201 mg/dL — ABNORMAL HIGH (ref 70–99)
Glucose-Capillary: 163 mg/dL — ABNORMAL HIGH (ref 70–99)

## 2023-05-02 LAB — RENAL FUNCTION PANEL
Albumin: 2.7 g/dL — ABNORMAL LOW (ref 3.5–5.0)
Anion gap: 14 (ref 5–15)
BUN: 14 mg/dL (ref 6–20)
CO2: 20 mmol/L — ABNORMAL LOW (ref 22–32)
Calcium: 8.9 mg/dL (ref 8.9–10.3)
Chloride: 108 mmol/L (ref 98–111)
Creatinine, Ser: 5.77 mg/dL — ABNORMAL HIGH (ref 0.44–1.00)
GFR, Estimated: 8 mL/min — ABNORMAL LOW (ref >60)
Glucose, Bld: 161 mg/dL — ABNORMAL HIGH (ref 70–99)
Phosphorus: 5 mg/dL — ABNORMAL HIGH (ref 2.5–4.6)
Potassium: 3.4 mmol/L — ABNORMAL LOW (ref 3.5–5.1)
Sodium: 142 mmol/L (ref 135–145)

## 2023-05-02 MED ORDER — LIDOCAINE HCL (PF) 1 % IJ SOLN
INTRAMUSCULAR | Status: AC
  Filled 2023-05-02: qty 30

## 2023-05-02 MED ORDER — LIDOCAINE HCL (PF) 1 % IJ SOLN
10.0000 mL | Freq: Once | INTRAMUSCULAR | Status: DC
  Filled 2023-05-02: qty 10

## 2023-05-02 MED ORDER — HYDROMORPHONE HCL 1 MG/ML IJ SOLN
0.5000 mg | Freq: Four times a day (QID) | INTRAMUSCULAR | Status: DC | PRN
  Administered 2023-05-02 – 2023-05-04 (×6): 0.5 mg via INTRAVENOUS
  Filled 2023-05-02 (×6): qty 0.5

## 2023-05-02 NOTE — Progress Notes (Signed)
 No signs of acute distress. Patient denies any chest pain or sob.  Scheduled Midodrine po given . Care Ongoing.  05/02/23 0610  Assess: MEWS Score  Temp 98.5 F (36.9 C)  BP (!) 99/57  MAP (mmHg) 72  Pulse Rate (!) 109  Resp 18  SpO2 100 %  Assess: MEWS Score  MEWS Temp 0  MEWS Systolic 1  MEWS Pulse 1  MEWS RR 0  MEWS LOC 0  MEWS Score 2  MEWS Score Color Yellow  Assess: if the MEWS score is Yellow or Red  Were vital signs accurate and taken at a resting state? Yes  Does the patient meet 2 or more of the SIRS criteria? No  MEWS guidelines implemented  Yes, yellow  Treat  MEWS Interventions Considered administering scheduled or prn medications/treatments as ordered  Take Vital Signs  Increase Vital Sign Frequency  Yellow: Q2hr x1, continue Q4hrs until patient remains green for 12hrs  Escalate  MEWS: Escalate Yellow: Discuss with charge nurse and consider notifying provider and/or RRT  Notify: Charge Nurse/RN  Name of Charge Nurse/RN Notified [facility]  Provider Notification  Provider Name/Title J. Garner Nash NP  Date Provider Notified 05/02/23  Time Provider Notified [phone]  Method of Notification Page (secured c hat)  Notification Reason Change in status  Provider response No new orders  Date of Provider Response 05/02/23  Time of Provider Response 0645  Assess: SIRS CRITERIA  SIRS Temperature  0  SIRS Respirations  0  SIRS Pulse 1  SIRS WBC 0  SIRS Score Sum  1

## 2023-05-02 NOTE — Progress Notes (Signed)
 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  Also reports poor oral intake in the setting of nausea, vomiting and diarrhea for 2 days.  She lives at home by herself and endorses she does not have anyone to help her around.  She is not able to transfer using [facility] lift.  She was found to have potassium of 2.9, BUN 46, bicarb 19, troponin 23--- 16.  CT chest abdomen and pelvis without contrast no acute chest abdomen and pelvis abnormality.  Scattered coronary artery disease, scattered groundglass opacity of the lung favor atelectasis.  Fibroid uterus.  Nephrology was consulted patient was admitted for hemodialysis and further care.    Assessment & Plan:   Principal Problem:   Volume overload Active Problems:   Chronic hypokalemia   Chronic diarrhea   ESRD on dialysis (HCC)   Anemia of chronic disease   Paroxysmal atrial fibrillation (HCC)   GERD (gastroesophageal reflux disease)   Insulin dependent type 2 diabetes mellitus (HCC)   Chronic gout   Gastroparesis   Chronic depression   NASH (nonalcoholic steatohepatitis)   Chronic pain syndrome   Hx of BKA, right (HCC)   Chronic diastolic CHF (congestive heart failure) (HCC)   Carcinoid tumor of abdomen   PVD (peripheral vascular disease) (HCC)   History of GI bleed   History of arteriovenous  malformation (AVM)   Generalized anxiety disorder   History of reactive airway disease   History of spinal stenosis   Chronic diabetic ulcer of left foot determined by examination [facility])   Dysphagia   Amputation of right lower extremity below knee (HCC)   Non-pressure chronic ulcer of left heel and midfoot limited to breakdown of skin (HCC)   1-Volume overload secondary to missing hemodialysis Acute on chronic diastolic heart failure CT chest with scattered groundglass opacity ESRD on hemodialysis, noncompliance due to transportation issues Volume managed with hemodialysis.  Nephrology consulted had hemodialysis 2/18. Next HD 2/20. HD tomorrow 2/25 Stable   Chronic hypotension: Continue with midodrine.  Chest pain/abdominal pain, nausea vomiting Reports that oral Dilaudid has not been helping with her pain.  She usually required few days of IV pain medication. CT abdomen and pelvis with no acute findings IV Protonix. She has a history of carcinoid tumor Underwent endoscopy 2/20: nodular mucosa duodenum, Normal esophagus dilated, retained gastric fluid.  Continue with schedule Zofran. GI recommend weaning opioid to help with gastroparesis.  Nausea vomiting diarrhea: Suspect related to duodenal carcinoid tumor GI has been consulted Carcinoid,gastroparesis.  Dysphagia: Report inability to swallow pills and reports something got stuck GI has been consulted. Underwent endoscopy, esophagus normal, dilated.  Schedule zofran  Anemia of chronic disease secondary to ESRD: Stable. Monitor hb  IDDM-2: A1c 5.7% on 11/29.  Monitor  CBG     Paroxysmal atrial fibrillation: Not on anticoagulation due to prior GI bleed -Continue metoprolol 25 mg twice daily. -Optimize electrolytes.   GERD/history of GIB/AVM -Continue Protonix and  pain present   Anxiety/depression/chronic pain syndrome: -Continue home meds-BuSpar, Atarax, gabapentin Psych consulted,reported suicidal thought to  SW Patient have been clear for discharge to SNF per psych. Does not need inpatient psychiatric facility admission Continue with Remeron and risperidone  Gastroparesis: Unfortunately allergic to Reglan.  Also on opiate which might not be a good choice.  Wean IV pain meds.   NASH (nonalcoholic steatohepatitis) -Continue to monitor hepatic function panel.   Right BKA/ambulatory dysfunction: Patient reports using Hoyer lift for transfer.  Sitting on bedside during my evaluation. -PT/OT eval   Carcinoid tumor of abdomen/chronic diarrhea: Reports 2-3 BMs daily at baseline. -Continue loperamide.   PVD (peripheral vascular disease) (HCC) -Continue Lipitor   History of reactive airway disease: Stable. - continue Xopenex as needed    History of spinal stenosis with associated chronic pain syndrome -Continue gabapentin, Robaxin Dilaudid as needed   Chronic diabetic ulcer of left foot determined by examination (HCC) -Wound care consulted.  -Appreciate Dr Floy Sabina care  Morbid obesity: Elevated BMI with comorbidity as above Body mass index is 38.04 kg/m.        Pressure Injury 11/23/22 Ischial tuberosity Left Unstageable - Full thickness tissue loss in which the base of the injury is covered by slough (yellow, tan, gray, green or brown) and/or eschar (tan, brown or black) in the wound bed. (Active)  11/23/22 0853  Location: Ischial tuberosity  Location Orientation: Left  Staging: Unstageable - Full thickness tissue loss in which the base of the injury is covered by slough (yellow, tan, gray, green or brown) and/or eschar (tan, brown or black) in the wound bed.  Wound Description (Comments):   Present on Admission: Yes     Pressure Injury 11/24/22 Ischial tuberosity Right Stage 2 -  Partial thickness loss of dermis presenting as a shallow open injury with a red, pink wound bed without slough. stage two injury to upper thigh into groin region. (Active)  11/24/22 1452  Location:  Ischial tuberosity  Location Orientation: Right  Staging: Stage 2 -  Partial thickness loss of dermis presenting as a shallow open injury with a red, pink wound bed without slough.  Wound Description (Comments): stage two injury to upper thigh into groin region.  Present on Admission: Yes                  Estimated body mass index is 34.73 kg/m as calculated from the following:   Height as of 03/22/23: 5\' 6"  (1.676 m).   Weight as of this encounter: 97.6 kg.   DVT prophylaxis: SCD Code Status: Full code Family Communication: Disposition Plan:  Status is: Inpatient Remains inpatient appropriate because: Discharge to SNF when bed available.    Consultants:  GI Nephrology   Procedures:  HD   Antimicrobials:    Subjective Alert, report still having pain.  Dilaudid 8 hours is too long.   Objective: Vitals:   05/01/23 1415 05/01/23 2014 05/02/23 0610 05/02/23 0748  BP: (!) 105/55 91/81 (!) 99/57 99/73  Pulse: 83 73 (!) 109 (!) 110  Resp:  20 18   Temp: 98.2 F (36.8 C) 98 F (36.7 C) 98.5 F (36.9 C) 98.6 F (37 C)  TempSrc: Oral Oral Oral Oral  SpO2: 100% 100% 100% 100%  Weight:       No intake or output data in the 24 hours ending 05/02/23 1434   Filed Weights   04/28/23 1813 04/30/23 1454 04/30/23 1942  Weight: 99.7 kg 98.7 kg 97.6 kg  Examination:  General exam: NAD Respiratory system: CTA Cardiovascular system: S 1, S 2 RRR Gastrointestinal system: BS presents,soft, nt Central nervous system: Alert Data Reviewed: I have personally reviewed following labs and imaging studies  CBC: Recent Labs  Lab 04/28/23 0457 04/29/23 0900 04/30/23 0440 05/01/23 0818 05/02/23 0639  WBC 10.1 9.5 10.1 9.5 11.8*  NEUTROABS  --   --  6.4  --  7.1  HGB 7.9* 7.5* 7.5* 7.0* 8.2*  HCT 25.6* 24.3* 23.9* 22.9* 27.8*  MCV 96.2 94.9 95.2 96.6 100.4*  PLT 266 304 301 268 333   Basic Metabolic Panel: Recent Labs  Lab 04/26/23 0618 04/26/23 0619  04/28/23 0510 04/29/23 0900 04/30/23 0440 05/02/23 0639  NA 136 135 139 135 138 142  K 3.1* 3.0* 2.9* 3.0* 3.1* 3.4*  CL 104 105 103 99 96* 108  CO2 17* 17* 21* 25 24 20*  GLUCOSE 81 90 96 93 97 161*  BUN 50* 48* 27* 12 16 14   CREATININE 8.66* 8.88* 6.80* 4.70* 5.74* 5.77*  CALCIUM 8.4* 8.5* 8.9 8.5* 9.7 8.9  MG  --   --  2.1  --   --   --   PHOS 10.7*  --  7.0*  --  5.5* 5.0*   GFR: Estimated Creatinine Clearance: 12.4 mL/min (A) (by C-G formula based on SCr of 5.77 mg/dL (H)). Liver Function Tests: Recent Labs  Lab 04/26/23 0618 04/26/23 0619 04/28/23 0510 04/30/23 0440 05/02/23 0639  AST  --  9*  --   --   --   ALT  --  7  --   --   --   ALKPHOS  --  78  --   --   --   BILITOT  --  0.5  --   --   --   PROT  --  6.1*  --   --   --   ALBUMIN 2.2* 2.1* 2.5* 2.4* 2.7*   No results for input(s): "LIPASE", "AMYLASE" in the last 168 hours.  No results for input(s): "AMMONIA" in the last 168 hours. Coagulation Profile: No results for input(s): "INR", "PROTIME" in the last 168 hours. Cardiac Enzymes: No results for input(s): "CKTOTAL", "CKMB", "CKMBINDEX", "TROPONINI" in the last 168 hours. BNP (last 3 results) No results for input(s): "PROBNP" in the last 8760 hours. HbA1C: No results for input(s): "HGBA1C" in the last 72 hours. CBG: Recent Labs  Lab 05/01/23 1106 05/01/23 1609 05/01/23 2026 05/02/23 0644 05/02/23 1126  GLUCAP 109* 195* 184* 142* 209*   Lipid Profile: No results for input(s): "CHOL", "HDL", "LDLCALC", "TRIG", "CHOLHDL", "LDLDIRECT" in the last 72 hours. Thyroid Function Tests: No results for input(s): "TSH", "T4TOTAL", "FREET4", "T3FREE", "THYROIDAB" in the last 72 hours. Anemia Panel: No results for input(s): "VITAMINB12", "FOLATE", "FERRITIN", "TIBC", "IRON", "RETICCTPCT" in the last 72 hours.  Sepsis Labs: No results for input(s): "PROCALCITON", "LATICACIDVEN" in the last 168 hours.  Recent Results (from the past 240 hours)   Gastrointestinal Panel by PCR , Stool     Status: None   Collection Time: 05/01/23 12:07 AM   Specimen: Stool  Result Value Ref Range Status   Campylobacter species NOT DETECTED NOT DETECTED Final   Plesimonas shigelloides NOT DETECTED NOT DETECTED Final   Salmonella species NOT DETECTED NOT DETECTED Final   Yersinia enterocolitica NOT DETECTED NOT DETECTED Final   Vibrio species NOT DETECTED NOT DETECTED Final   Vibrio cholerae NOT DETECTED NOT DETECTED Final   Enteroaggregative E coli (EAEC) NOT DETECTED  NOT DETECTED Final   Enteropathogenic E coli (EPEC) NOT DETECTED NOT DETECTED Final   Enterotoxigenic E coli (ETEC) NOT DETECTED NOT DETECTED Final   Shiga like toxin producing E coli (STEC) NOT DETECTED NOT DETECTED Final   Shigella/Enteroinvasive E coli (EIEC) NOT DETECTED NOT DETECTED Final   Cryptosporidium NOT DETECTED NOT DETECTED Final   Cyclospora cayetanensis NOT DETECTED NOT DETECTED Final   Entamoeba histolytica NOT DETECTED NOT DETECTED Final   Giardia lamblia NOT DETECTED NOT DETECTED Final   Adenovirus F40/41 NOT DETECTED NOT DETECTED Final   Astrovirus NOT DETECTED NOT DETECTED Final   Norovirus GI/GII NOT DETECTED NOT DETECTED Final   Rotavirus A NOT DETECTED NOT DETECTED Final   Sapovirus (I, II, IV, and V) NOT DETECTED NOT DETECTED Final    Comment: Performed at [facility], [address], [place], [place] [address]         Radiology Studies: No results found.       Scheduled Meds:  (feeding supplement) PROSource Plus  30 mL Oral BID BM   atorvastatin  10 mg Oral QPM   busPIRone  5 mg Oral TID   ferric citrate  210 mg Oral TID WC   Gerhardt's butt cream   Topical BID   leptospermum manuka honey  1 Application Topical Daily   lidocaine (PF)  10 mL Intradermal Once   magic mouthwash w/lidocaine  5 mL Oral QID   metoprolol tartrate  25 mg Oral BID   midodrine  10 mg Oral TID WC   mirtazapine  15 mg Oral QHS   ondansetron (ZOFRAN) IV  4  mg Intravenous Q8H   pantoprazole (PROTONIX) IV  40 mg Intravenous Q12H   risperiDONE  1 mg Oral QHS   sodium chloride flush  10-40 mL Intracatheter Q12H   sodium chloride flush  3 mL Intravenous Q12H   sodium chloride flush  3 mL Intravenous Q12H   Continuous Infusions:  famotidine (PEPCID) IV 20 mg (05/02/23 1117)     LOS: 7 days    Time spent: 35 minutes    Jay Haskew A Vayda Dungee, MD [facility]   If 7PM-7AM, please contact night-coverage [email or web address]  05/02/2023, 2:34 PM

## 2023-05-02 NOTE — Plan of Care (Signed)
  Problem: Education: Goal: Knowledge of General Education information will improve Description: Including pain rating scale, medication(s)/side effects and non-pharmacologic comfort measures Outcome: Progressing   Problem: Pain Managment: Goal: General experience of comfort will improve and/or be controlled Outcome: Progressing   Problem: Safety: Goal: Ability to remain free from injury will improve Outcome: Progressing   Problem: Skin Integrity: Goal: Risk for impaired skin integrity will decrease Outcome: Progressing

## 2023-05-02 NOTE — Procedures (Signed)
 HD cath evaluated at bedside, appears intact but only secured with dressing, no retention sutures.   Verbal consent obtained from the patient, skin was prepped in usual sterile fashion with Chlorhexidine, infiltrated with 1 % lidocaine and  two simple interrupted sutures placed to secure the HD catheter.    New dressing placed, patient tolerated the procedure well. Please call IR for questions and concerns.   Lynann Bologna Charlita Brian PA-C 05/02/2023 10:15 AM

## 2023-05-02 NOTE — Progress Notes (Signed)
 Physical Therapy Treatment Patient Details Name: Deborah [name] MRN: [id number] DOB: 07/03/1965 Today's Date: 05/02/2023   History of Present Illness 58 y/o female presents to [facility] on 2/17 with resports of chest pain, abdominal pain, and increased swelling, nausea, vomiting, and diahrrea. Admitted with volume overload secondary to missing dialysis. Upper GI endoscopy 2/20 w/ nodular mucosa duodenum, Normal esophagus dilated, retained gastric fluid, biopsied. PMHx: ESRD on dialysis, gastroparesis, GERD, anemia of chronic disease, PAF, diastolic heart failure with preserved EF 55 to 60%, gout, duodenal carcinoid tumor, PVD s/p R BKA, history of gastric AVM, NASH, reactive dyskinesia, depression, anxiety, obesity, restrictive lung disease, spinal stenosis, chronic pain syndrome, insulin-dependent DM type II, chronic left heel diabetic ulcer, and history of chronic diarrhea 2/2 carcinoid tumor.    PT Comments  Pt seated at EOB upon arrival and agreeable to PT session. Pt was able to move from sit/supine with supervision for safety in today's session. Pt was able to perform multiple rolls bilaterally for placement of bed pan, pericare, and placement of hoyer lift pad. Pt needs increased assistance, MinA, when rolling to the right 2/2 weakness. Transferred to the recliner using hoyer lift at end of session. Pt is progressing towards goals. Pt would continue to benefit from <3hrs post acute rehab to work on independence with mobility and ADLs. Acute PT to follow.      If plan is discharge home, recommend the following: Two people to help with walking and/or transfers;A lot of help with bathing/dressing/bathroom;Assistance with cooking/housework;Assist for transportation   Can travel by private vehicle     No  Equipment Recommendations  None recommended by PT       Precautions / Restrictions Precautions Precautions: Fall;Other (comment) Precaution/Restrictions Comments: R BKA  (baseline) Restrictions Weight Bearing Restrictions Per Provider Order: No     Mobility  Bed Mobility Overal bed mobility: Needs Assistance Bed Mobility: Rolling, Sit to Supine Rolling: Min assist, Used rails    Sit to supine: Supervision   General bed mobility comments: MinA for roll to the right, supervision for rolls to the left. Multiple rolls for pericare and placement of hoyer lift pad    Transfers Overall transfer level: Needs assistance Equipment used: Ambulation equipment used Transfers: Bed to chair/wheelchair/BSC    Transfer via Lift Equipment: Maximove    Balance Overall balance assessment: Needs assistance, Mild deficits observed, not formally tested Sitting-balance support: Bilateral upper extremity supported, Feet supported Sitting balance-Leahy Scale: Good      Communication Communication Communication: No apparent difficulties  Cognition Arousal: Alert Behavior During Therapy: WFL for tasks assessed/performed, Lability   PT - Cognitive impairments: No apparent impairments      Following commands: Intact      Cueing Cueing Techniques: Verbal cues     General Comments General comments (skin integrity, edema, etc.): VSS on RA,      Pertinent Vitals/Pain Pain Assessment Pain Assessment: No/denies pain     PT Goals (current goals can now be found in the care plan section) Acute Rehab PT Goals PT Goal Formulation: With patient Time For Goal Achievement: 05/11/23 Potential to Achieve Goals: Fair Progress towards PT goals: Progressing toward goals    Frequency    Min 1X/week       AM-PAC PT "6 Clicks" Mobility   Outcome Measure  Help needed turning from your back to your side while in a flat bed without using bedrails?: A Little Help needed moving from lying on your back to sitting on the side of a  flat bed without using bedrails?: A Little Help needed moving to and from a bed to a chair (including a wheelchair)?: Total Help needed  standing up from a chair using your arms (e.g., wheelchair or bedside chair)?: Total Help needed to walk in hospital room?: Total Help needed climbing 3-5 steps with a railing? : Total 6 Click Score: 10    End of Session Equipment Utilized During Treatment: Other (comment) (hoyer lift) Activity Tolerance: Patient tolerated treatment well Patient left: in chair;with call bell/phone within reach;with chair alarm set Nurse Communication: Mobility status;Need for lift equipment PT Visit Diagnosis: Muscle weakness (generalized) (M62.81)     Time: 9562-1308 PT Time Calculation (min) (ACUTE ONLY): 32 min  15:11-15:20, 15:27-15:43  Charges:    $Therapeutic Activity: 23-37 mins PT General Charges $$ ACUTE PT VISIT: 1 Visit                    Hilton Cork, PT, DPT Secure Chat Preferred  Rehab Office [phone]   Arturo Morton Brion Aliment 05/02/2023, 3:50 PM

## 2023-05-02 NOTE — TOC Progression Note (Signed)
 Transition of Care [facility]) - Progression Note    Patient Details  Name: Deborah [name] MRN: [id number] Date of Birth: 03-17-1965  Transition of Care [facility] CM/SW Contact  Lorri Frederick, LCSW Phone Number: 05/02/2023, 2:35 PM  Clinical Narrative:   CSW left message with admissions/[facility].  1415: CSW spoke with nancy/[facility].  She does not have available beds.  CSW spoke with pt, updated her on [facility].  She will go to [place].  New PT note needed for insurance auth, PT aware, not scheduled to see pt until Wed but will see if anyone available.      Expected Discharge Plan: Skilled Nursing Facility Barriers to Discharge: Continued Medical Work up, SNF Pending bed offer  Expected Discharge Plan and Services In-house Referral: Clinical Social Work   Post Acute Care Choice: Skilled Nursing Facility Living arrangements for the past 2 months: Single Family Home                                       Social Determinants of Health (SDOH) Interventions SDOH Screenings   Food Insecurity: No Food Insecurity (04/25/2023)  Recent Concern: Food Insecurity - High Risk (01/25/2023)   Received from [facility]  Housing: High Risk (04/25/2023)  Transportation Needs: Unmet Transportation Needs (04/25/2023)  Utilities: Not At Risk (04/25/2023)  Recent Concern: Utilities - Medium Risk (01/25/2023)   Received from [facility]  Depression (PHQ2-9): Medium Risk (03/16/2023)  Financial Resource Strain: Low Risk (03/23/2021)   [facility])  Physical Activity: Not on File (10/31/2017)   Received from [place], [place]  Social Connections: Unknown (07/19/2021)   [facility] from [facility]  Stress: Low Risk (03/23/2021)   Received from [facility])  Tobacco Use: Low Risk  (04/25/2023)    Readmission Risk Interventions    03/07/2023    4:19 PM 11/25/2022    5:16 PM 07/05/2022     1:20 PM  Readmission Risk Prevention Plan  Transportation Screening Complete Complete Complete  Medication Review (RN Care Manager) Complete Complete Referral to Pharmacy  PCP or Specialist appointment within 3-5 days of discharge Complete Complete Complete  HRI or Home Care Consult Complete Complete Complete  SW Recovery Care/Counseling Consult Complete Complete Complete  Palliative Care Screening Complete Not Applicable Not Applicable  Skilled Nursing Facility Not Applicable Not Applicable Not Applicable

## 2023-05-02 NOTE — Progress Notes (Addendum)
 [facility] Progress Note   Subjective: Sitting up at bedside. NO C/Os. HD tomorrow on schedule.      Objective Vitals:   05/01/23 1415 05/01/23 2014 05/02/23 0610 05/02/23 0748  BP: (!) 105/55 91/81 (!) 99/57 99/73  Pulse: 83 73 (!) 109 (!) 110  Resp:  20 18   Temp: 98.2 F (36.8 C) 98 F (36.7 C) 98.5 F (36.9 C) 98.6 F (37 C)  TempSrc: Oral Oral Oral Oral  SpO2: 100% 100% 100% 100%  Weight:       Physical Exam General: Chronically ill appearing female in NAD Heart: S1,S2 No M/R/G SR Lungs: CTAB  Abdomen: Obese NABs Extremities: R BKA, trace edema R stump; trace edema LLE; Drsg L foot Dialysis Access: [facility] drsg intact    Additional Objective Labs: Basic Metabolic Panel: Recent Labs  Lab 04/28/23 0510 04/29/23 0900 04/30/23 0440 05/02/23 0639  NA 139 135 138 142  K 2.9* 3.0* 3.1* 3.4*  CL 103 99 96* 108  CO2 21* 25 24 20*  GLUCOSE 96 93 97 161*  BUN 27* 12 16 14   CREATININE 6.80* 4.70* 5.74* 5.77*  CALCIUM 8.9 8.5* 9.7 8.9  PHOS 7.0*  --  5.5* 5.0*   Liver Function Tests: Recent Labs  Lab 04/26/23 0619 04/28/23 0510 04/30/23 0440 05/02/23 0639  AST 9*  --   --   --   ALT 7  --   --   --   ALKPHOS 78  --   --   --   BILITOT 0.5  --   --   --   PROT 6.1*  --   --   --   ALBUMIN 2.1* 2.5* 2.4* 2.7*   No results for input(s): "LIPASE", "AMYLASE" in the last 168 hours. CBC: Recent Labs  Lab 04/28/23 0457 04/29/23 0900 04/30/23 0440 05/01/23 0818 05/02/23 0639  WBC 10.1 9.5 10.1 9.5 11.8*  NEUTROABS  --   --  6.4  --  7.1  HGB 7.9* 7.5* 7.5* 7.0* 8.2*  HCT 25.6* 24.3* 23.9* 22.9* 27.8*  MCV 96.2 94.9 95.2 96.6 100.4*  PLT 266 304 301 268 333   Blood Culture    Component Value Date/Time   SDES BLOOD BLOOD RIGHT HAND 02/17/2023 2243   SPECREQUEST  02/17/2023 2243    BOTTLES DRAWN AEROBIC AND ANAEROBIC Blood Culture results may not be optimal due to an inadequate volume of blood received in culture bottles   CULT  02/17/2023  2243    NO GROWTH 5 DAYS Performed at [facility], [address], [place], [place] [address]    REPTSTATUS 02/22/2023 FINAL 02/17/2023 2243    Cardiac Enzymes: No results for input(s): "CKTOTAL", "CKMB", "CKMBINDEX", "TROPONINI" in the last 168 hours. CBG: Recent Labs  Lab 05/01/23 1106 05/01/23 1609 05/01/23 2026 05/02/23 0644 05/02/23 1126  GLUCAP 109* 195* 184* 142* 209*   Iron Studies: No results for input(s): "IRON", "TIBC", "TRANSFERRIN", "FERRITIN" in the last 72 hours. @lablastinr3 @ Studies/Results: No results found. Medications:  famotidine (PEPCID) IV 20 mg (05/02/23 1117)    (feeding supplement) PROSource Plus  30 mL Oral BID BM   atorvastatin  10 mg Oral QPM   busPIRone  5 mg Oral TID   ferric citrate  210 mg Oral TID WC   Gerhardt's butt cream   Topical BID   leptospermum manuka honey  1 Application Topical Daily   lidocaine (PF)  10 mL Intradermal Once   magic mouthwash w/lidocaine  5  mL Oral QID   metoprolol tartrate  25 mg Oral BID   midodrine  10 mg Oral TID WC   mirtazapine  15 mg Oral QHS   ondansetron (ZOFRAN) IV  4 mg Intravenous Q8H   pantoprazole (PROTONIX) IV  40 mg Intravenous Q12H   risperiDONE  1 mg Oral QHS   sodium chloride flush  10-40 mL Intracatheter Q12H   sodium chloride flush  3 mL Intravenous Q12H   sodium chloride flush  3 mL Intravenous Q12H     Dialysis Orders: [facility] T,TH,S 4 hr 180NRe 400/800 112 kg 3.0 K/2.5 Ca TDC - No Heparin  - Hectorol 5 mcg IV three times per week - Mircera 150 mcg IV q 2 weeks (last dose 04/09/2023)   Assessment/Plan: Volume overload - d/t missing HD which were transportation issues per the pt. Mostly LE edema. Max UF w/ HD.  Left heel ulcer - Seen by Ortho today: continue conservative dressing for now. Once stable with R prosthesis, consider intervention of left heel. Borderline personality trait disorder - Psych consulted ESRD - on HD TTS. Next HD 2/25. HD machine clotted off 2/20. Do not  see any contraindications with using Heparin. Ordered Heparin bolus 2000 units. Vascular Access - Noted patient's TDC was twisting during yesterday's HD causing the machine to alarm. HD RN recommended a retention suture to be applied. Plan to reach to IR to have them take a look tomorrow. Hypokalemia-Follow labs. Use 4 K bath. Continue Supplementing with oral K+ if needed.  Hypotension - chronic hypotension it appears. BP here is soft to low-normal. Increased midodrine to 10 tid as her BPs were dropping during HD yesterday, takes BB for afib most likely.  Volume - By bed scales, now under OP EDW. Lungs clear, chronic LE edema. Lower EDW if possible on DD. Continue Torsemide on non HD days Lower EDW on discharge Anemia of esrd - Hb now 8.2.  Given Darbe 200 mcg SQ due on 04/26/2023. Follow HGB. Transfuse for Hgb < 7.  Secondary hyperparathyroidism - CCa and phos okay. Resume binders w/ meals.  Atrial fib - chronic. No anticoagulation.    Disposition: Stable from nephrology stand point for discharge. Anticipate DC to SNF.   Rita H. Brown NP-C 05/02/2023, 2:30 PM  [employer] [phone]   Seen and examined independently.  Agree with note and exam as documented above by physician extender and as noted here.  General adult female in bed in no acute distress HEENT normocephalic atraumatic extraocular movements intact sclera anicteric Neck supple trachea midline Lungs clear to auscultation bilaterally normal work of breathing at rest  Heart regular rate and rhythm no rubs or gallops appreciated Abdomen soft nontender nondistended Extremities right BKA; left left 1-2+ edema Psych normal mood and affect Access LUE AVF bruit and thrill RIJ catheter  ESRD - On HD per TTS schedule.  IR added a retention stitch - appreciate assistance   Fluid volume overload - optimize UF with HD  Hypokalemia - on added K bath   Estanislado Emms, MD 05/02/2023  6:11 PM

## 2023-05-03 DIAGNOSIS — R52 Pain, unspecified: Secondary | ICD-10-CM | POA: Diagnosis not present

## 2023-05-03 LAB — GLUCOSE, CAPILLARY
Glucose-Capillary: 136 mg/dL — ABNORMAL HIGH (ref 70–99)
Glucose-Capillary: 130 mg/dL — ABNORMAL HIGH (ref 70–99)
Glucose-Capillary: 205 mg/dL — ABNORMAL HIGH (ref 70–99)
Glucose-Capillary: 268 mg/dL — ABNORMAL HIGH (ref 70–99)

## 2023-05-03 LAB — CBC
HCT: 28.7 % — ABNORMAL LOW (ref 36.0–46.0)
Hemoglobin: 8.6 g/dL — ABNORMAL LOW (ref 12.0–15.0)
MCH: 29.2 pg (ref 26.0–34.0)
MCHC: 30 g/dL (ref 30.0–36.0)
MCV: 97.3 fL (ref 80.0–100.0)
Platelets: 335 10*3/uL (ref 150–400)
RBC: 2.95 MIL/uL — ABNORMAL LOW (ref 3.87–5.11)
RDW: 14.7 % (ref 11.5–15.5)
WBC: 14 10*3/uL — ABNORMAL HIGH (ref 4.0–10.5)
nRBC: 0 % (ref 0.0–0.2)

## 2023-05-03 LAB — RENAL FUNCTION PANEL
Albumin: 2.8 g/dL — ABNORMAL LOW (ref 3.5–5.0)
Anion gap: 14 (ref 5–15)
BUN: 21 mg/dL — ABNORMAL HIGH (ref 6–20)
CO2: 20 mmol/L — ABNORMAL LOW (ref 22–32)
Calcium: 9.2 mg/dL (ref 8.9–10.3)
Chloride: 104 mmol/L (ref 98–111)
Creatinine, Ser: 6.84 mg/dL — ABNORMAL HIGH (ref 0.44–1.00)
GFR, Estimated: 6 mL/min — ABNORMAL LOW (ref >60)
Glucose, Bld: 135 mg/dL — ABNORMAL HIGH (ref 70–99)
Phosphorus: 6.2 mg/dL — ABNORMAL HIGH (ref 2.5–4.6)
Potassium: 3.6 mmol/L (ref 3.5–5.1)
Sodium: 138 mmol/L (ref 135–145)

## 2023-05-03 MED ORDER — LIDOCAINE-PRILOCAINE 2.5-2.5 % EX CREA: 1.0000 | TOPICAL_CREAM | CUTANEOUS | Status: DC | PRN

## 2023-05-03 MED ORDER — FAMOTIDINE 20 MG PO TABS
20.0000 mg | ORAL_TABLET | Freq: Every day | ORAL | Status: DC
  Administered 2023-05-04: 20 mg via ORAL
  Filled 2023-05-03: qty 1

## 2023-05-03 MED ORDER — PANTOPRAZOLE SODIUM 40 MG PO TBEC
40.0000 mg | DELAYED_RELEASE_TABLET | Freq: Two times a day (BID) | ORAL | Status: DC
  Administered 2023-05-03 – 2023-05-04 (×2): 40 mg via ORAL
  Filled 2023-05-03 (×2): qty 1

## 2023-05-03 MED ORDER — DARBEPOETIN ALFA 200 MCG/0.4ML IJ SOSY
200.0000 ug | PREFILLED_SYRINGE | INTRAMUSCULAR | Status: DC
  Administered 2023-05-03: 200 ug via SUBCUTANEOUS
  Filled 2023-05-03: qty 0.4

## 2023-05-03 MED ORDER — LIDOCAINE HCL (PF) 1 % IJ SOLN: 5.0000 mL | INTRAMUSCULAR | Status: DC | PRN

## 2023-05-03 MED ORDER — ONDANSETRON 4 MG PO TBDP: 4.0000 mg | ORAL_TABLET | Freq: Three times a day (TID) | ORAL | 0 refills | Status: DC | PRN

## 2023-05-03 MED ORDER — RISPERIDONE 1 MG PO TBDP: 1.0000 mg | ORAL_TABLET | Freq: Every day | ORAL | 0 refills | Status: DC

## 2023-05-03 MED ORDER — ANTICOAGULANT SODIUM CITRATE 4% (200MG/5ML) IV SOLN: 5.0000 mL | Status: DC | PRN

## 2023-05-03 MED ORDER — HYDROMORPHONE HCL 2 MG PO TABS
2.0000 mg | ORAL_TABLET | Freq: Four times a day (QID) | ORAL | Status: DC | PRN
  Administered 2023-05-03: 2 mg via ORAL
  Filled 2023-05-03: qty 1

## 2023-05-03 MED ORDER — ALLOPURINOL 100 MG PO TABS
100.0000 mg | ORAL_TABLET | Freq: Two times a day (BID) | ORAL | Status: DC
  Administered 2023-05-03 – 2023-05-04 (×3): 100 mg via ORAL
  Filled 2023-05-03 (×3): qty 1

## 2023-05-03 MED ORDER — ALTEPLASE 2 MG IJ SOLR: 2.0000 mg | Freq: Once | INTRAMUSCULAR | Status: DC | PRN

## 2023-05-03 MED ORDER — HYDROMORPHONE HCL 2 MG PO TABS: 2.0000 mg | ORAL_TABLET | Freq: Four times a day (QID) | ORAL | 0 refills | Status: DC | PRN

## 2023-05-03 MED ORDER — HYDROMORPHONE HCL 2 MG PO TABS: 2.0000 mg | ORAL_TABLET | Freq: Four times a day (QID) | ORAL | Status: DC | PRN

## 2023-05-03 MED ORDER — PENTAFLUOROPROP-TETRAFLUOROETH EX AERO: 1.0000 | INHALATION_SPRAY | CUTANEOUS | Status: DC | PRN

## 2023-05-03 MED ORDER — HEPARIN SODIUM (PORCINE) 1000 UNIT/ML DIALYSIS
1000.0000 [IU] | INTRAMUSCULAR | Status: DC | PRN
  Administered 2023-05-03: 3800 [IU]
  Filled 2023-05-03: qty 1

## 2023-05-03 MED ORDER — HEPARIN SODIUM (PORCINE) 1000 UNIT/ML DIALYSIS
20.0000 [IU]/kg | INTRAMUSCULAR | Status: DC | PRN
  Administered 2023-05-03: 2000 [IU] via INTRAVENOUS_CENTRAL
  Filled 2023-05-03 (×2): qty 2

## 2023-05-03 NOTE — Progress Notes (Signed)
 OT Cancellation Note  Patient Details Name: JOHN VASCONCELOS MRN: [id number] DOB: [birth year]   Cancelled Treatment:    Reason Eval/Treat Not Completed: (P) Other (comment) Pt in recliner, states she has had a rough day, waiting on pain meds and to go to HD, will return tomorrow to ensure maximal participation with therapy.   Alexis Goodell 05/03/2023, 1:48 PM

## 2023-05-03 NOTE — Plan of Care (Signed)

## 2023-05-03 NOTE — Progress Notes (Signed)
   05/03/23 1812  Vitals  BP (!) 86/46  MAP (mmHg) (!) 55  BP Method Automatic  Pulse Rate (!) 105  Pulse Rate Source Monitor  MEWS COLOR  MEWS Score Color Yellow  Oxygen Therapy  SpO2 100 %  MEWS Score  MEWS Temp 0  MEWS Systolic 1  MEWS Pulse 1  MEWS RR 1  MEWS LOC 0  MEWS Score 3   Pt blood pressure continues to be low. She understands that if she takes narcotic pain medicine that decrease her BP further. She was crying upon arrival requesting pain medication and I explained to patient that she needs to be compliant in taking her midodrine if she wants her pain medication. She stated that she had taken midodrine in dialysis. Pt refuses her oral dilaudid after MD explained the importance of transitioning to oral before discharge to SNF.  She states that "2mg  is not enough and that she takes 4mg  oral at home." Continues to request IV dilaudid and refuses oral doses. Upon returning from dialysis with low BP all pain med doses withheld. Pt states that she understands.

## 2023-05-03 NOTE — Progress Notes (Signed)
 Arrived to unit and could audibly hear pt crying when I arrived. Previous nurse reported that pt was crying on the previous shift. Asked pt if she was in pain in which she said yes. She reported generalized pain 10/10 and I give IV dilaudid around 0730.  2 hours later she was audibly crying and saying that she had, "too much going on."  A female arrived to her room and asked if he could have food. I gave him a bag lunch from the refrigerator.  About an hour later I heard pt crying in the room and getting upset with guest.  I asked if she was ok and she said that she was fine. A few minutes later I heard the female call the pt a bitch in which I responded by asking pt if she felt uncomfortable and that I would ask guest to leave. Guest stated that I was eavesdropping and that I need to stay out of the situation. I reported to NP who had entered the room and she instructed me to make a note and notify supervisor. Tech notified security, and I reported to Press photographer. CN went to pt room and addressed until security arrived. Guest was escorted off of the unit and loudly called me a bitch before leaving.

## 2023-05-03 NOTE — Progress Notes (Addendum)
 Patient wanted to end treatment early due to not getting any pain meds. NP aware. Patient refused to sign AMA form. NP notified.

## 2023-05-03 NOTE — Procedures (Signed)
 Received patient in bed to unit.  Alert and oriented.  Informed consent signed and in chart.   TX duration: 1 hour 47 min  Pt. Requested to end treatment early.  Transported back to the room  Alert, without acute distress.  Hand-off given to patient's nurse.   Access used: left avf Access issues: none  Total UF removed: 0.8 liters Medication(s) given: midodrine Po  Lu Duffel, RN [facility]

## 2023-05-03 NOTE — Discharge Summary (Signed)
 Physician Discharge Summary   Patient: Deborah [name] MRN: [id number] DOB: [birth year]  Admit date:     04/25/2023  Discharge date: 05/04/23  Discharge Physician: Alba Cory   PCP: Pcp, No   Recommendations at discharge:   Needs to follow up with Dr. Lajoyce Corners for chronic lower extremity Needs to follow-up with oncologist for further care of carcinoid tumor Follow-up with GI as needed for chronic abdominal pain and diarrhea  Discharge Diagnoses: Principal Problem:   Volume overload Active Problems:   Chronic hypokalemia   Chronic diarrhea   ESRD on dialysis (HCC)   Anemia of chronic disease   Paroxysmal atrial fibrillation (HCC)   GERD (gastroesophageal reflux disease)   Insulin dependent type 2 diabetes mellitus (HCC)   Chronic gout   Gastroparesis   Chronic depression   NASH (nonalcoholic steatohepatitis)   Chronic pain syndrome   Hx of BKA, right (HCC)   Chronic diastolic CHF (congestive heart failure) (HCC)   Carcinoid tumor of abdomen   PVD (peripheral vascular disease) (HCC)   History of GI bleed   History of arteriovenous malformation (AVM)   Generalized anxiety disorder   History of reactive airway disease   History of spinal stenosis   Chronic diabetic ulcer of left foot determined by examination [facility])   Dysphagia   Amputation of right lower extremity below knee (HCC)   Non-pressure chronic ulcer of left heel and midfoot limited to breakdown of skin (HCC)  Resolved Problems:   * No resolved hospital problems. 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 Also reports poor oral intake in the setting of nausea, vomiting and diarrhea for 2 days. She lives at home by herself and endorses she does not have anyone to help her around. She is not able to transfer using [facility] lift. She was found to have potassium of 2.9, BUN 46, bicarb 19, troponin 23--- 16. CT chest abdomen and pelvis without contrast no acute chest abdomen and pelvis abnormality. Scattered coronary artery disease, scattered groundglass opacity of the lung favor atelectasis. Fibroid uterus. Nephrology was consulted patient was admitted for hemodialysis and further care.   Discharge to SNF 2/26 if tolerates oral.  Assessment and Plan: 1-Volume overload secondary to missing hemodialysis Acute on chronic diastolic heart failure CT chest with scattered groundglass opacity ESRD on hemodialysis, noncompliance due to transportation issues Volume managed with hemodialysis.  Nephrology consulted had hemodialysis 2/18. Next HD 2/20. HD tomorrow 2/25 Stable    Chronic hypotension: Continue with midodrine. Imdur and spironolactone discontinue due to hypotension.  Chest pain/abdominal pain, nausea vomiting Reports that oral Dilaudid has not been helping with her pain.  She usually required few days of IV pain medication. CT abdomen and pelvis with no acute findings IV Protonix. She has a history of carcinoid tumor Underwent endoscopy 2/20: nodular mucosa duodenum, Normal esophagus dilated, retained gastric fluid.  Continue with schedule Zofran. GI recommend weaning opioid to help with gastroparesis. She will try oral pain meds today   Nausea vomiting diarrhea: Suspect related to duodenal  carcinoid tumor GI has been consulted Carcinoid,gastroparesis.   Dysphagia: Report inability to swallow pills and reports something got stuck GI has been  consulted. Underwent endoscopy, esophagus normal, dilated.  Schedule zofran   Anemia of chronic disease secondary to ESRD: Stable. Monitor hb   IDDM-2: A1c 5.7% on 11/29.  Monitor  CBG     Paroxysmal atrial fibrillation: Not on anticoagulation due to prior GI bleed -Continue metoprolol 25 mg twice daily.    GERD/history of GIB/AVM -Continue Protonix and pain present   Anxiety/depression/chronic pain syndrome: -Continue home meds-BuSpar, Atarax, gabapentin Psych consulted,reported suicidal thought to SW Patient have been clear for discharge to SNF per psych. Does not need inpatient psychiatric facility admission Continue with Remeron and risperidone   Gastroparesis: Unfortunately allergic to Reglan.  Also on opiate which might not be a good choice. It has been difficult to wean pain meds due to pain.   NASH (nonalcoholic steatohepatitis) -Continue to monitor hepatic function panel.   Right BKA/ambulatory dysfunction: Patient reports using [facility] lift for transfer.  Sitting on bedside during my evaluation. -PT/OT eval   Carcinoid tumor of abdomen/chronic diarrhea: Reports 2-3 BMs daily at baseline. -Continue loperamide.   PVD (peripheral vascular disease) (HCC) -Continue Lipitor   History of reactive airway disease: Stable. - Continue Xopenex as needed    History of spinal stenosis with associated chronic pain syndrome -Continue gabapentin, Robaxin Dilaudid as needed   Chronic diabetic ulcer of left foot determined by examination (HCC) -Wound care consulted.  -Appreciate Dr Floy Sabina care   Morbid obesity: Elevated BMI with comorbidity as above Body mass index is 38.04 kg/m.     See wound care documentation below  Pressure Injury 11/23/22 Ischial tuberosity Left Unstageable - Full thickness tissue loss in which the base of the injury is covered by slough (yellow, tan, gray, green or brown) and/or eschar (tan, brown or black) in the wound bed. (Active)  11/23/22  0853  Location: Ischial tuberosity  Location Orientation: Left  Staging: Unstageable - Full thickness tissue loss in which the base of the injury is covered by slough (yellow, tan, gray, green or brown) and/or eschar (tan, brown or black) in the wound bed.  Wound Description (Comments):   Present on Admission: Yes     Pressure Injury 11/24/22 Ischial tuberosity Right Stage 2 -  Partial thickness loss of dermis presenting as a shallow open injury with a red, pink wound bed without slough. stage two injury to upper thigh into groin region. (Active)  11/24/22 1452  Location: Ischial tuberosity  Location Orientation: Right  Staging: Stage 2 -  Partial thickness loss of dermis presenting as a shallow open injury with a red, pink wound bed without slough.  Wound Description (Comments): stage two injury to upper thigh into groin region.  Present on Admission: Yes                       Consultants: GI,Nephrology  Procedures performed: HD Disposition: Home Diet recommendation:  Cardiac diet DISCHARGE MEDICATION: Allergies as of 05/03/2023       Reactions   Gabapentin Hives, [name]   Isovue [iopamidol] Anaphylaxis, [name], Other (See Comments)   11/28/17 Patient had seizure like activity and then 1 min code after 100 cc of isovue 300. Possible contrast allergy vs vasovagal episode Cardiac Arrest   Nsaids Anaphylaxis, Other (See Comments)   Hx of stomach ulcers   Penicillins Itching, Palpitations, Other (See Comments)  Flushing (Red Skin) Laryngeal Edema   Reglan [metoclopramide] Other (See Comments)   Tardive dyskinesia   Valium [diazepam] [name]   Zestril [lisinopril] Anaphylaxis, Swelling   Tongue and mouth swelling Laryngeal Edema   Tolectin [tolmetin] Nausea And Vomiting, Nausea Only, Other (See Comments)   Irritates stomach ulcer   Asa [aspirin] Other (See Comments)   Hx of stomach ulcer   Aspartame And Phenylalanine Hives    Bentyl [dicyclomine] Other (See Comments)   Chest pain   Hibiclens [chlorhexidine Gluconate] Other (See Comments)   Dermatitis    Flexeril [cyclobenzaprine] Palpitations   Oxycontin [oxycodone] Palpitations   Rifamycins Palpitations   Tylenol [acetaminophen] Nausea And Vomiting, Nausea Only, Other (See Comments)   Irritates stomach ulcer Abdominal pain   Ultram [tramadol] Nausea And Vomiting, Palpitations        Medication List     STOP taking these medications    carvedilol 12.5 MG tablet Commonly known as: COREG   hydrALAZINE 50 MG tablet Commonly known as: APRESOLINE   hydrOXYzine 25 MG capsule Commonly known as: VISTARIL   isosorbide mononitrate 60 MG 24 hr tablet Commonly known as: IMDUR   spironolactone 25 MG tablet Commonly known as: ALDACTONE       TAKE these medications    (feeding supplement) PROSource Plus liquid Take 30 mLs by mouth 2 (two) times daily between meals.   nutrition supplement (JUVEN) Pack Take 1 packet by mouth 2 (two) times daily between meals.   Acidophilus Caps capsule Take 1 capsule by mouth 3 (three) times daily with meals.   albuterol (2.5 MG/3ML) 0.083% nebulizer solution Commonly known as: PROVENTIL Take 3 mLs (2.5 mg total) by nebulization every 6 (six) hours as needed for wheezing or shortness of breath.   allopurinol 100 MG tablet Commonly known as: ZYLOPRIM Take 1 tablet (100 mg total) by mouth 2 (two) times daily.   ascorbic acid 500 MG tablet Commonly known as: VITAMIN C Take 500 mg by mouth daily.   atorvastatin 10 MG tablet Commonly known as: LIPITOR Take 10 mg by mouth every evening.   busPIRone 5 MG tablet Commonly known as: BUSPAR Take 5 mg by mouth 3 (three) times daily.   cetirizine 10 MG tablet Commonly known as: ZYRTEC Take 10 mg by mouth daily.   famotidine 20 MG tablet Commonly known as: PEPCID Take 20 mg by mouth daily before breakfast.   ferric citrate 1 GM 210 MG(Fe) tablet Commonly  known as: AURYXIA Take 2 tablets (420 mg total) by mouth 3 (three) times daily with meals. What changed: how much to take   fluticasone 50 MCG/ACT nasal spray Commonly known as: FLONASE Place 2 sprays into both nostrils daily as needed for allergies or rhinitis.   folic acid 1 MG tablet Commonly known as: FOLVITE Take 1 tablet (1 mg total) by mouth daily.   HYDROmorphone 2 MG tablet Commonly known as: DILAUDID Take 1 tablet (2 mg total) by mouth every 6 (six) hours as needed for severe pain (pain score 7-10) or moderate pain (pain score 4-6). What changed:  how much to take how to take this when to take this reasons to take this additional instructions   hydrOXYzine 25 MG tablet Commonly known as: ATARAX Take 1 tablet (25 mg total) by mouth 3 (three) times daily as needed for anxiety. What changed:  when to take this additional instructions   insulin lispro 100 UNIT/ML KwikPen Commonly known as: HUMALOG Before each meal 3 times a day,  140-199 - 2 units, 200-250 - 6 units, 251-299 - 8 units,  300-349 - 12 units,  350 or above 14 units. What changed:  how much to take how to take this when to take this additional instructions   leptospermum manuka honey Pste paste Apply 1 Application topically daily.   loperamide 2 MG capsule Commonly known as: IMODIUM Take 1 capsule (2 mg total) by mouth as needed for diarrhea or loose stools.   methocarbamol 500 MG tablet Commonly known as: ROBAXIN Take 1 tablet (500 mg total) by mouth every 6 (six) hours as needed for muscle spasms.   metoprolol tartrate 25 MG tablet Commonly known as: LOPRESSOR Take 1 tablet (25 mg total) by mouth 2 (two) times daily.   midodrine 5 MG tablet Commonly known as: PROAMATINE Take 5 mg by mouth 3 (three) times daily.   mirtazapine 15 MG tablet Commonly known as: REMERON Take 15 mg by mouth at bedtime.   ondansetron 4 MG disintegrating tablet Commonly known as: ZOFRAN-ODT Take 1 tablet (4 mg  total) by mouth every 8 (eight) hours as needed for nausea or vomiting.   pantoprazole 40 MG tablet Commonly known as: PROTONIX Take 1 tablet (40 mg total) by mouth 2 (two) times daily.   risperiDONE 1 MG disintegrating tablet Commonly known as: RISPERDAL M-TABS Take 1 tablet (1 mg total) by mouth at bedtime.   SENIOR MULTIVITAMIN PLUS PO Take 1 tablet by mouth daily.        Contact information for follow-up providers     [facility]. Go on 05/05/2023.   Why: Schedule is Tuesday, Thursday, Saturday with 12:10 pm chair time. Contact information: [address] [place] [address] [phone]              Contact information for after-discharge care     Destination     [facility] Preferred SNF .   Service: Skilled Nursing Contact information: [address] [place] [address] [phone]                    Discharge Exam: Ceasar Mons Weights   04/28/23 1813 04/30/23 1454 04/30/23 1942  Weight: 99.7 kg 98.7 kg 97.6 kg   General; NAD Condition at discharge: stable  The results of significant diagnostics from this hospitalization (including imaging, microbiology, ancillary and laboratory) are listed below for reference.   Imaging Studies: CT CHEST ABDOMEN PELVIS WO CONTRAST Result Date: 04/25/2023 CLINICAL DATA:  Sepsis, chest pain, abdominal pain EXAM: CT CHEST, ABDOMEN AND PELVIS WITHOUT CONTRAST TECHNIQUE: Multidetector CT imaging of the chest, abdomen and pelvis was performed following the standard protocol without IV contrast. RADIATION DOSE REDUCTION: This exam was performed according to the departmental dose-optimization program which includes automated exposure control, adjustment of the mA and/or kV according to patient size and/or use of iterative reconstruction technique. COMPARISON:  03/22/2023 FINDINGS: CT CHEST FINDINGS Cardiovascular: Right side dialysis catheter again noted,  unchanged. Heart is normal size. Scattered coronary artery calcifications. Aorta normal caliber. Mediastinum/Nodes: No mediastinal, hilar, or axillary adenopathy. Trachea and esophagus are unremarkable. Thyroid unremarkable. Lungs/Pleura: Scattered ground-glass opacities, likely areas of atelectasis. No confluent airspace opacities or effusions. Musculoskeletal: Chest wall soft tissues are unremarkable. No acute bony abnormality. CT ABDOMEN PELVIS FINDINGS Hepatobiliary: No focal liver abnormality is seen. Status post cholecystectomy. No biliary dilatation. Pancreas: No focal abnormality or ductal dilatation. Spleen: No focal abnormality.  Normal size. Adrenals/Urinary Tract: No adrenal abnormality. No focal renal abnormality. No  stones or hydronephrosis. Urinary bladder is unremarkable. Stomach/Bowel: Stomach, large and small bowel grossly unremarkable. Vascular/Lymphatic: No evidence of aneurysm or adenopathy. Reproductive: Stable uterine fibroid.  No acute findings. Other: No free fluid or free air. Musculoskeletal: No acute bony abnormality. IMPRESSION: No acute findings in the chest, abdomen or pelvis. Scattered aortic atherosclerosis crash that scattered coronary artery disease. Scattered ground-glass opacities in the lungs, favor areas of atelectasis. Fibroid uterus. Electronically Signed   By: Charlett Nose M.D.   On: 04/25/2023 18:11   DG Chest 2 View Result Date: 04/25/2023 CLINICAL DATA:  Chest pain. EXAM: CHEST - 2 VIEW COMPARISON:  03/22/2023. FINDINGS: Bilateral lung fields are clear. No pulmonary edema. Bilateral costophrenic angles are clear. Normal cardio-mediastinal silhouette. No acute osseous abnormalities. The soft tissues are within normal limits. Right IJ hemodialysis catheter noted with its tip overlying the cavoatrial junction region, unchanged. IMPRESSION: No active cardiopulmonary disease. Electronically Signed   By: Jules Schick M.D.   On: 04/25/2023 15:57    Microbiology: Results  for orders placed or performed during the hospital encounter of 04/25/23  Gastrointestinal Panel by PCR , Stool     Status: None   Collection Time: 05/01/23 12:07 AM   Specimen: Stool  Result Value Ref Range Status   Campylobacter species NOT DETECTED NOT DETECTED Final   Plesimonas shigelloides NOT DETECTED NOT DETECTED Final   Salmonella species NOT DETECTED NOT DETECTED Final   Yersinia enterocolitica NOT DETECTED NOT DETECTED Final   Vibrio species NOT DETECTED NOT DETECTED Final   Vibrio cholerae NOT DETECTED NOT DETECTED Final   Enteroaggregative E coli (EAEC) NOT DETECTED NOT DETECTED Final   Enteropathogenic E coli (EPEC) NOT DETECTED NOT DETECTED Final   Enterotoxigenic E coli (ETEC) NOT DETECTED NOT DETECTED Final   Shiga like toxin producing E coli (STEC) NOT DETECTED NOT DETECTED Final   Shigella/Enteroinvasive E coli (EIEC) NOT DETECTED NOT DETECTED Final   Cryptosporidium NOT DETECTED NOT DETECTED Final   Cyclospora cayetanensis NOT DETECTED NOT DETECTED Final   Entamoeba histolytica NOT DETECTED NOT DETECTED Final   Giardia lamblia NOT DETECTED NOT DETECTED Final   Adenovirus F40/41 NOT DETECTED NOT DETECTED Final   Astrovirus NOT DETECTED NOT DETECTED Final   Norovirus GI/GII NOT DETECTED NOT DETECTED Final   Rotavirus A NOT DETECTED NOT DETECTED Final   Sapovirus (I, II, IV, and V) NOT DETECTED NOT DETECTED Final    Comment: Performed at [facility], [address] [place], [place] [address]    Labs: CBC: Recent Labs  Lab 04/29/23 0900 04/30/23 0440 05/01/23 0818 05/02/23 0639 05/03/23 1200  WBC 9.5 10.1 9.5 11.8* 14.0*  NEUTROABS  --  6.4  --  7.1  --   HGB 7.5* 7.5* 7.0* 8.2* 8.6*  HCT 24.3* 23.9* 22.9* 27.8* 28.7*  MCV 94.9 95.2 96.6 100.4* 97.3  PLT 304 301 268 333 335   Basic Metabolic Panel: Recent Labs  Lab 04/28/23 0510 04/29/23 0900 04/30/23 0440 05/02/23 0639 05/03/23 1200  NA 139 135 138 142 138  K 2.9* 3.0* 3.1* 3.4* 3.6   CL 103 99 96* 108 104  CO2 21* 25 24 20* 20*  GLUCOSE 96 93 97 161* 135*  BUN 27* 12 16 14  21*  CREATININE 6.80* 4.70* 5.74* 5.77* 6.84*  CALCIUM 8.9 8.5* 9.7 8.9 9.2  MG 2.1  --   --   --   --   PHOS 7.0*  --  5.5* 5.0* 6.2*   Liver Function Tests:  Recent Labs  Lab 04/28/23 0510 04/30/23 0440 05/02/23 0639 05/03/23 1200  ALBUMIN 2.5* 2.4* 2.7* 2.8*   CBG: Recent Labs  Lab 05/02/23 1126 05/02/23 1611 05/02/23 2228 05/03/23 0636 05/03/23 1142  GLUCAP 209* 201* 163* 136* 130*    Discharge time spent: greater than 30 minutes.  Signed: Alba Cory, MD [facility] 05/03/2023

## 2023-05-03 NOTE — TOC Progression Note (Addendum)
 Transition of Care [facility]) - Progression Note    Patient Details  Name: Deborah [name] MRN: [id number] Date of Birth: 31-Oct-1965  Transition of [facility]) CM/SW Contact  Lorri Frederick, LCSW Phone Number: 05/03/2023, 8:37 AM  Clinical Narrative:   CSW attempted to submit SNF auth request for pt--error message in [place] upon submission, with message to contact the navi help desk.  1030: CSW spoke to [place] started, clinical uploaded.    1215: SNF auth approved: [id number], 3 days: 2/25-2/27.  MD/ renal navigator informed.  Expected Discharge Plan: Skilled Nursing Facility Barriers to Discharge: Continued Medical Work up, SNF Pending bed offer  Expected Discharge Plan and Services In-house Referral: Clinical Social Work   Post Acute Care Choice: Skilled Nursing Facility Living arrangements for the past 2 months: Single Family Home                                       Social Determinants of Health (SDOH) Interventions SDOH Screenings   Food Insecurity: No Food Insecurity (04/25/2023)  Recent Concern: Food Insecurity - High Risk (01/25/2023)   Received from [facility]  Housing: High Risk (04/25/2023)  Transportation Needs: Unmet Transportation Needs (04/25/2023)  Utilities: Not At Risk (04/25/2023)  Recent Concern: Utilities - Medium Risk (01/25/2023)   Received from [facility]  Depression (PHQ2-9): Medium Risk (03/16/2023)  Financial Resource Strain: Low Risk (03/23/2021)   Received from [facility])  Physical Activity: Not on File (10/31/2017)   Received from [place]  Social Connections: Unknown (07/19/2021)   Received from [facility], [facility]  Stress: Low Risk (03/23/2021)   [facility] from [facility])  Tobacco Use: Low Risk  (04/25/2023)    Readmission Risk Interventions    03/07/2023    4:19 PM 11/25/2022    5:16 PM 07/05/2022    1:20 PM  Readmission Risk Prevention  Plan  Transportation Screening Complete Complete Complete  Medication Review (RN Care Manager) Complete Complete Referral to Pharmacy  PCP or Specialist appointment within 3-5 days of discharge Complete Complete Complete  HRI or Home Care Consult Complete Complete Complete  SW Recovery Care/Counseling Consult Complete Complete Complete  Palliative Care Screening Complete Not Applicable Not Applicable  Skilled Nursing Facility Not Applicable Not Applicable Not Applicable

## 2023-05-03 NOTE — Progress Notes (Signed)
 Patient requested pain meds but this nurse advised her that her BP was too low. Patient requested that I talk to the doctor. NP states I can give pain meds if her systolic is 120 or greater.

## 2023-05-03 NOTE — Progress Notes (Addendum)
 [facility] Progress Note   Subjective: seen sitting up in chair. Visitor at bedside. RN reports visitor called patient a bitch and is being disrespectful to pt. RN reporting matter to Associate Professor. Patient says she is having diarrhea has PRN imodium. Will have HD later today.     Objective Vitals:   05/02/23 0748 05/02/23 1549 05/02/23 1935 05/03/23 0600  BP: 99/73 102/64 113/86 107/79  Pulse: (!) 110 89 90   Resp:   20 20  Temp: 98.6 F (37 C)  97.8 F (36.6 C) 97.6 F (36.4 C)  TempSrc: Oral  Oral Oral  SpO2: 100% 100% 100% 100%  Weight:       PPhysical Exam General: Chronically ill appearing female in NAD Heart: S1,S2 No M/R/G SR Lungs: CTAB  Abdomen: Obese NABs Extremities: R BKA, trace edema R stump; trace edema LLE; drsg L ankle 1+ pedal edema Dialysis Access: [facility] drsg intact   Additional Objective Labs: Basic Metabolic Panel: Recent Labs  Lab 04/28/23 0510 04/29/23 0900 04/30/23 0440 05/02/23 0639  NA 139 135 138 142  K 2.9* 3.0* 3.1* 3.4*  CL 103 99 96* 108  CO2 21* 25 24 20*  GLUCOSE 96 93 97 161*  BUN 27* 12 16 14   CREATININE 6.80* 4.70* 5.74* 5.77*  CALCIUM 8.9 8.5* 9.7 8.9  PHOS 7.0*  --  5.5* 5.0*   Liver Function Tests: Recent Labs  Lab 04/28/23 0510 04/30/23 0440 05/02/23 0639  ALBUMIN 2.5* 2.4* 2.7*   No results for input(s): "LIPASE", "AMYLASE" in the last 168 hours. CBC: Recent Labs  Lab 04/28/23 0457 04/29/23 0900 04/30/23 0440 05/01/23 0818 05/02/23 0639  WBC 10.1 9.5 10.1 9.5 11.8*  NEUTROABS  --   --  6.4  --  7.1  HGB 7.9* 7.5* 7.5* 7.0* 8.2*  HCT 25.6* 24.3* 23.9* 22.9* 27.8*  MCV 96.2 94.9 95.2 96.6 100.4*  PLT 266 304 301 268 333   Blood Culture    Component Value Date/Time   SDES BLOOD BLOOD RIGHT HAND 02/17/2023 2243   SPECREQUEST  02/17/2023 2243    BOTTLES DRAWN AEROBIC AND ANAEROBIC Blood Culture results may not be optimal due to an inadequate volume of blood received in culture bottles    CULT  02/17/2023 2243    NO GROWTH 5 DAYS Performed at [facility], [address] [place], [place] [address]    REPTSTATUS 02/22/2023 FINAL 02/17/2023 2243    Cardiac Enzymes: No results for input(s): "CKTOTAL", "CKMB", "CKMBINDEX", "TROPONINI" in the last 168 hours. CBG: Recent Labs  Lab 05/02/23 0644 05/02/23 1126 05/02/23 1611 05/02/23 2228 05/03/23 0636  GLUCAP 142* 209* 201* 163* 136*   Iron Studies: No results for input(s): "IRON", "TIBC", "TRANSFERRIN", "FERRITIN" in the last 72 hours. @lablastinr3 @ Studies/Results: No results found. Medications:  anticoagulant sodium citrate      (feeding supplement) PROSource Plus  30 mL Oral BID BM   allopurinol  100 mg Oral BID   atorvastatin  10 mg Oral QPM   busPIRone  5 mg Oral TID   darbepoetin (ARANESP) injection - NON-DIALYSIS  200 mcg Subcutaneous Q Tue-1800   [START ON 05/04/2023] famotidine  20 mg Oral Daily   ferric citrate  210 mg Oral TID WC   Gerhardt's butt cream   Topical BID   leptospermum manuka honey  1 Application Topical Daily   lidocaine (PF)  10 mL Intradermal Once   magic mouthwash w/lidocaine  5 mL Oral QID   metoprolol tartrate  25 mg Oral BID   midodrine  10 mg Oral TID WC   mirtazapine  15 mg Oral QHS   ondansetron (ZOFRAN) IV  4 mg Intravenous Q8H   pantoprazole  40 mg Oral BID   risperiDONE  1 mg Oral QHS   sodium chloride flush  10-40 mL Intracatheter Q12H   sodium chloride flush  3 mL Intravenous Q12H   sodium chloride flush  3 mL Intravenous Q12H     Dialysis Orders: [facility] T,TH,S 4 hr 180NRe 400/800 112 kg 3.0 K/2.5 Ca TDC - No Heparin  - Hectorol 5 mcg IV three times per week - Mircera 150 mcg IV q 2 weeks (last dose 04/09/2023)   Assessment/Plan: Volume overload - d/t missing HD which were transportation issues per the pt. Mostly LE edema. Max UF w/ HD.  Left heel ulcer - Seen by Ortho today: continue conservative dressing for now. Once stable with R prosthesis, consider  intervention of left heel. Borderline personality trait disorder - Psych consulted ESRD - on HD TTS. Next HD 2/25. HD machine clotted off 2/20. Do not see any contraindications with using Heparin. Ordered Heparin bolus 2000 units. Vascular Access - retention sutures placed per IR 05/02/2023 Hypokalemia-Follow labs. Use 4 K bath. Continue Supplementing with oral K+ if needed.  Hypotension - chronic hypotension it appears. BP here is soft to low-normal. Increased midodrine to 10 tid as her BPs were dropping during HD yesterday, takes BB for afib most likely.  Volume - By bed scales, now under OP EDW. Lungs clear, chronic LE edema. Lower EDW if possible on DD. Continue Torsemide on non HD days Lower EDW on discharge Anemia of esrd - Hb now 8.2.  Given Darbe 200 mcg SQ due on 04/26/2023. Follow HGB. Transfuse for Hgb < 7.  Secondary hyperparathyroidism - CCa and phos okay. Resume binders w/ meals.  Atrial fib - chronic. No anticoagulation.      Disposition: Stable from nephrology stand point for discharge. Anticipate DC to SNF.     Rita H. Brown NP-C 05/03/2023, 10:51 AM  [employer] [phone]   Seen and examined independently.  Agree with note and exam as documented above by physician extender and as noted here.  Seen and examined on dialysis.  Blood pressure 85/62 and HR 118.  Reduced goal to 2kg. Keep systolic BP 90's.  Procedure supervised.  States low BP is chronic.    Estanislado Emms, MD 05/03/2023  3:55 PM

## 2023-05-04 DIAGNOSIS — S88111D Complete traumatic amputation at level between knee and ankle, right lower leg, subsequent encounter: Secondary | ICD-10-CM

## 2023-05-04 DIAGNOSIS — K3184 Gastroparesis: Secondary | ICD-10-CM | POA: Diagnosis not present

## 2023-05-04 DIAGNOSIS — E11621 Type 2 diabetes mellitus with foot ulcer: Secondary | ICD-10-CM | POA: Diagnosis not present

## 2023-05-04 DIAGNOSIS — Z89511 Acquired absence of right leg below knee: Secondary | ICD-10-CM

## 2023-05-04 DIAGNOSIS — I480 Paroxysmal atrial fibrillation: Secondary | ICD-10-CM | POA: Diagnosis not present

## 2023-05-04 DIAGNOSIS — E8770 Fluid overload, unspecified: Secondary | ICD-10-CM | POA: Diagnosis not present

## 2023-05-04 DIAGNOSIS — D3A098 Benign carcinoid tumors of other sites: Secondary | ICD-10-CM

## 2023-05-04 LAB — GLUCOSE, CAPILLARY: Glucose-Capillary: 173 mg/dL — ABNORMAL HIGH (ref 70–99)

## 2023-05-04 MED ORDER — MIDODRINE HCL 10 MG PO TABS: 10.0000 mg | ORAL_TABLET | Freq: Three times a day (TID) | ORAL | Status: DC

## 2023-05-04 MED ORDER — ONDANSETRON 4 MG PO TBDP: 4.0000 mg | ORAL_TABLET | Freq: Three times a day (TID) | ORAL | 0 refills | Status: DC | PRN

## 2023-05-04 MED ORDER — MIDODRINE HCL 10 MG PO TABS: 10.0000 mg | ORAL_TABLET | ORAL | Status: DC

## 2023-05-04 MED ORDER — BUSPIRONE HCL 5 MG PO TABS: 5.0000 mg | ORAL_TABLET | Freq: Three times a day (TID) | ORAL | 0 refills | Status: DC

## 2023-05-04 MED ORDER — RISPERIDONE 1 MG PO TBDP: 1.0000 mg | ORAL_TABLET | Freq: Every day | ORAL | 0 refills | Status: AC

## 2023-05-04 MED ORDER — MIDODRINE HCL 5 MG PO TABS: 10.0000 mg | ORAL_TABLET | ORAL | Status: DC

## 2023-05-04 MED ORDER — HYDROMORPHONE HCL 2 MG PO TABS: 2.0000 mg | ORAL_TABLET | Freq: Four times a day (QID) | ORAL | 0 refills | Status: DC | PRN

## 2023-05-04 NOTE — Progress Notes (Addendum)
 [facility] Progress Note   Subjective: Had discharge orders written yesterday. Signed off HD early yesterday D/T inability to receive pain medication on HD with SBP <90.  DC to SNF today.    Objective Vitals:   05/03/23 1812 05/03/23 2007 05/04/23 0459 05/04/23 0905  BP: (!) 86/46 114/62 103/69 125/75  Pulse: (!) 105 76 95 94  Resp:  18 16 18   Temp:  98 F (36.7 C) 98.5 F (36.9 C) 97.9 F (36.6 C)  TempSrc:  Oral Oral Oral  SpO2: 100% 100% 100% 100%  Weight:       Physical Exam General: Chronically ill appearing female in NAD Heart: S1,S2 No M/R/G SR Lungs: CTAB  Abdomen: Obese NABs Extremities: R BKA, trace edema R stump; trace edema LLE; drsg L ankle 1+ pedal edema Dialysis Access: [facility] drsg intact      Additional Objective Labs: Basic Metabolic Panel: Recent Labs  Lab 04/30/23 0440 05/02/23 0639 05/03/23 1200  NA 138 142 138  K 3.1* 3.4* 3.6  CL 96* 108 104  CO2 24 20* 20*  GLUCOSE 97 161* 135*  BUN 16 14 21*  CREATININE 5.74* 5.77* 6.84*  CALCIUM 9.7 8.9 9.2  PHOS 5.5* 5.0* 6.2*   Liver Function Tests: Recent Labs  Lab 04/30/23 0440 05/02/23 0639 05/03/23 1200  ALBUMIN 2.4* 2.7* 2.8*   No results for input(s): "LIPASE", "AMYLASE" in the last 168 hours. CBC: Recent Labs  Lab 04/29/23 0900 04/30/23 0440 05/01/23 0818 05/02/23 0639 05/03/23 1200  WBC 9.5 10.1 9.5 11.8* 14.0*  NEUTROABS  --  6.4  --  7.1  --   HGB 7.5* 7.5* 7.0* 8.2* 8.6*  HCT 24.3* 23.9* 22.9* 27.8* 28.7*  MCV 94.9 95.2 96.6 100.4* 97.3  PLT 304 301 268 333 335   Blood Culture    Component Value Date/Time   SDES BLOOD BLOOD RIGHT HAND 02/17/2023 2243   SPECREQUEST  02/17/2023 2243    BOTTLES DRAWN AEROBIC AND ANAEROBIC Blood Culture results may not be optimal due to an inadequate volume of blood received in culture bottles   CULT  02/17/2023 2243    NO GROWTH 5 DAYS Performed at [facility], [address], [place], [place] [address]     REPTSTATUS 02/22/2023 FINAL 02/17/2023 2243    Cardiac Enzymes: No results for input(s): "CKTOTAL", "CKMB", "CKMBINDEX", "TROPONINI" in the last 168 hours. CBG: Recent Labs  Lab 05/03/23 0636 05/03/23 1142 05/03/23 1813 05/03/23 2157 05/04/23 0619  GLUCAP 136* 130* 205* 268* 173*   Iron Studies: No results for input(s): "IRON", "TIBC", "TRANSFERRIN", "FERRITIN" in the last 72 hours. @lablastinr3 @ Studies/Results: No results found. Medications:   (feeding supplement) PROSource Plus  30 mL Oral BID BM   allopurinol  100 mg Oral BID   atorvastatin  10 mg Oral QPM   busPIRone  5 mg Oral TID   darbepoetin (ARANESP) injection - NON-DIALYSIS  200 mcg Subcutaneous Q Tue-1800   famotidine  20 mg Oral Daily   ferric citrate  210 mg Oral TID WC   Gerhardt's butt cream   Topical BID   leptospermum manuka honey  1 Application Topical Daily   lidocaine (PF)  10 mL Intradermal Once   magic mouthwash w/lidocaine  5 mL Oral QID   metoprolol tartrate  25 mg Oral BID   midodrine  10 mg Oral TID WC   mirtazapine  15 mg Oral QHS   ondansetron (ZOFRAN) IV  4 mg Intravenous Q8H   pantoprazole  40 mg Oral BID   risperiDONE  1 mg Oral QHS   sodium chloride flush  10-40 mL Intracatheter Q12H   sodium chloride flush  3 mL Intravenous Q12H   sodium chloride flush  3 mL Intravenous Q12H     Dialysis Orders: [facility] T,TH,S 4 hr 180NRe 400/800 112 kg 3.0 K/2.5 Ca TDC - No Heparin  - Hectorol 5 mcg IV three times per week - Mircera 150 mcg IV q 2 weeks (last dose 04/09/2023)   Assessment/Plan: Volume overload - d/t missing HD which were transportation issues per the pt. Mostly LE edema. Resolved. Left heel ulcer - Seen by Ortho today: continue conservative dressing for now. Once stable with R prosthesis, consider intervention of left heel. Borderline personality trait disorder - Psych consulted ESRD - on HD TTS. Next HD 05/05/2023 at [facility] Hypokalemia-Follow labs. Use 4 K bath. Continue  Supplementing with oral K+ if needed.  Hypotension - chronic hypotension it appears. BP here is soft to low-normal. Increased midodrine to 10 tid as her BPs were dropping during HD yesterday, takes BB for afib most likely. Will add midodrine 10 mg PO mid run of HD.  Volume - By bed scales, now under OP EDW. Lungs clear, chronic LE edema. Lower EDW if possible on DD. Continue Torsemide on non HD days Lower EDW on discharge Anemia of esrd - Hb now 8.6  Given Darbe 200 mcg SQ due on 05/03/2023. Follow HGB. Transfuse for Hgb < 7.  Secondary hyperparathyroidism - CCa and phos okay. Resume binders w/ meals.  Atrial fib - chronic. No anticoagulation.      Disposition: Stable from nephrology stand point for discharge. Anticipate DC to SNF.    Rita H. Brown NP-C 05/04/2023, 10:33 AM  [facility] [phone]   Patient discharged earlier today.  Agree with plans as outlined by Alonna Buckler, NP  Estanislado Emms, MD 12:57 PM 05/04/2023

## 2023-05-04 NOTE — Discharge Planning (Signed)
 [facility] Patient Discharge Orders- [facility] CLINIC: [place]  Patient's name: Deborah [name] Admit/DC Dates: 04/25/2023 - 05/04/2023  Discharge Diagnoses: Volume overload in setting of missed HD   L heel ulceration  Aranesp: Given: Yes   Date and amount of last dose: 200 mcg SQ 05/03/2023  Last Hgb: 8.6 PRBC's Given: NO Date/# of units: NA ESA dose for discharge: mircera 225 mcg IV q 2 weeks  IV Iron dose at discharge: per protocol  Heparin change: No  EDW Change: Yes New EDW: 101.5 kg  Bath Change: No  Access intervention/Change: No Details:  Hectorol/Calcitriol change: Yes corrected calcium 10.2 decrease Hectorol to 4 mcg IV three times per week   Discharge Labs: Calcium 9.2 Phosphorus 6.2 Albumin 2.8 K+ 3.6  IV Antibiotics: No Details:  On Coumadin?: NO Last INR: Next INR: Managed By:   OTHER/APPTS/LAB ORDERS:    D/C Meds to be reconciled by nurse after every discharge.  Completed By: Alonna Buckler [facility] [phone]    Reviewed by: MD:______ RN_______

## 2023-05-04 NOTE — Progress Notes (Signed)
Report called to [facility] 

## 2023-05-04 NOTE — Progress Notes (Signed)
 Pt to d/c to snf today. Contacted [facility] to be advised that pt will d/c to snf today and pt should resume care tomorrow. Clinic provided snf name as well.   Olivia Canter Renal Navigator [phone]

## 2023-05-04 NOTE — Discharge Summary (Addendum)
 Physician Discharge Summary  ARLENY KRUGER UEA:[id number] DOB: October 04, 1965 DOA: 04/25/2023  PCP: Oneita Hurt, No  Admit date: 04/25/2023 Discharge date: 05/04/2023  Time spent: 50 minutes  Recommendations for Outpatient Follow-up:  Needs to follow up with Dr. Lajoyce Corners for chronic lower extremity Needs to follow-up with oncologist for further care of carcinoid tumor Follow-up with GI as needed for chronic abdominal pain and diarrhea Follow-up with MD at SNF. Follow-up at regular hemodialysis center tomorrow 05/05/2023.  T     Discharge Diagnoses:  Principal Problem:   Volume overload Active Problems:   Chronic hypokalemia   Chronic diarrhea   ESRD on dialysis (HCC)   Anemia of chronic disease   Paroxysmal atrial fibrillation (HCC)   GERD (gastroesophageal reflux disease)   Insulin dependent type 2 diabetes mellitus (HCC)   Chronic gout   Gastroparesis   Chronic depression   NASH (nonalcoholic steatohepatitis)   Chronic pain syndrome   Hx of BKA, right (HCC)   Chronic diastolic CHF (congestive heart failure) (HCC)   Carcinoid tumor of abdomen   PVD (peripheral vascular disease) (HCC)   History of GI bleed   History of arteriovenous malformation (AVM)   Generalized anxiety disorder   History of reactive airway disease   History of spinal stenosis   Chronic diabetic ulcer of left foot determined by examination [facility])   Dysphagia   Amputation of right lower extremity below knee (HCC)   Non-pressure chronic ulcer of left heel and midfoot limited to breakdown of skin (HCC)   Discharge Condition: Stable and improved.  Diet recommendation: Carb modified diet  Filed Weights   04/30/23 1454 04/30/23 1942 05/03/23 1433  Weight: 98.7 kg 97.6 kg 102.8 kg    History of present illness:  HPI per Dr. Evelena Peat Deborah [name] is a 58 y.o. female with medical history significant of ESRD on dialysis, gastroparesis, GERD, anemia of chronic disease, paroxysmal atrial fibrillation,  diastolic heart failure with preserved EF 55 to 60%, gout, duodenal carcinoid tumor, peripheral vascular disease status post right-sided BKA, history of gastric AVM, NASH, reactive dyskinesia, depression, anxiety, obesity, restrictive lung disease, spinal stenosis, chronic pain syndrome, insulin-dependent DM type II, chronic left heel diabetic ulcer, and history of chronic diarrhea in the setting of carcinoid tumor presented to emergency department complaining of shortness of breath and volume overload. Patient also complaining about nausea, vomiting, diarrhea abdominal pain for past 2 to 3 days.  Reported poor oral intake.  Patient lives home by herself and endorses she does not have anyone to help her around. Patient reporting that she has missed dialysis for 2 weeks. Patient reported that she has been volume overloaded and feels swollen to her abdomen and bilateral lower extremities. Patient denies any chest pain, palpitation, headache, fever and chill.   Patient is crying in the room and requesting for that she needs home health nurse given her present home health nurse has been quitted the job and not coming to visit her for last 2 weeks.   Patient reported that at the baseline she is bedbound and uses a [place] lift to pull herself out of to the bed and uses wheelchair at baseline.  It has been very difficult to take care of herself at home without any help.   ED Course:  Presentation to ED patient is hemodynamically stable. B showing low potassium 2.9, low bicarb 19, sodium 135, BUN 46, creatinine 8.66 (elevated BUN and creatinine from baseline from missing dialysis, low calcium 8.2. CBC showing chronic leukocytosis elevated  0.6, stable H&H 8.2 and 27 and platelet count 288. Hepatic function panel showing low albumin 2.3. Initial troponin 23 which has been improved to 16. EKG showing sinus rhythm with premature atrial complex, left anterior fascicular block.   CT chest, abdomen and pelvis  without contrast no acute chest, abdomen or pelvis abnormality.  Scattered coronary artery disease.  Scattered groundglass opacity of the lung favoring atelectasis.  Fibroid uterus.   Chest x-ray no acute cardiopulmonary disease.   ED physician Dr. Velvet Bathe has been spoken with nephrology Dr. Arlean Hopping plan for dialysis tomorrow as there is no spot for dialysis tonight.   Hospitalist has been contacted for further evaluation management of volume overload from missing dialysis.   Significant labs in the ED: Lab Orders         Basic metabolic panel         CBC         Hepatic function panel         Lipase, blood         Hepatitis B surface antigen         Hepatitis B surface antibody,quantitative         Comprehensive metabolic panel         CBC         Glucose, capillary         HIV Antibody (routine testing w rflx)         I-stat chem 8, ED (not at [facility], [facility] or [facility])        Hospital Course:  For hospital course please see discharge summary dictated by Dr. Sunnie Nielsen on 05/03/2023  Procedures: Hemodialysis during the hospitalization Chest x-ray 04/25/2023   Consultations: Nephrology: Dr.Schertz 04/25/2023 Wound care RN 04/26/2023 Gastroenterology: Dr. Leonides Schanz 04/27/2023 Psychiatry: Dr.Maurer 04/28/2023 Orthopedics: Dr. Lajoyce Corners 04/30/2023  Discharge Exam: Vitals:   05/04/23 0459 05/04/23 0905  BP: 103/69 125/75  Pulse: 95 94  Resp: 16 18  Temp: 98.5 F (36.9 C) 97.9 F (36.6 C)  SpO2: 100% 100%    General: NAD Cardiovascular: RRR no murmurs rubs or gallops.  No JVD  Respiratory: Lungs clear to auscultation bilaterally.  No wheezes, no crackles, no rhonchi.  Fair air movement speaking in full sentences.  Discharge Instructions   Discharge Instructions     Diet Carb Modified   Complete by: As directed    Discharge wound care:   Complete by: As directed    Pressure Injury 11/23/22 Ischial tuberosity Left Unstageable - Full thickness tissue loss in which the base of the injury  is covered by slough (yellow, tan, gray, green or brown) and/or eschar (tan, brown or black) in the wound bed. 162 days    Pressure Injury 11/24/22 Ischial tuberosity Right Stage 2 -  Partial thickness loss of dermis presenting as a shallow open injury with a red, pink wound bed without slough. stage two injury to upper thigh into groin region. 160 days      Wound Care Orders (From admission, onward)      Start     Ordered   04/26/23 1430    Wound care  Daily      Comments: Clean L lateral heel wound with Vashe wound cleanser Hart Rochester [phone]), apply Medihoney to wound bed daily, cover with dry gauze and silicone foam or dry gauze and Kerlix roll gauze whichever patient prefers.   Increase activity slowly   Complete by: As directed       Allergies as of 05/04/2023  Reactions   Gabapentin Hives, [name]   Isovue [iopamidol] Anaphylaxis, [name], Other (See Comments)   11/28/17 Patient had seizure like activity and then 1 min code after 100 cc of isovue 300. Possible contrast allergy vs vasovagal episode Cardiac Arrest   Nsaids Anaphylaxis, Other (See Comments)   Hx of stomach ulcers   Penicillins Itching, Palpitations, Other (See Comments)   Flushing (Red Skin) Laryngeal Edema   Reglan [metoclopramide] Other (See Comments)   Tardive dyskinesia   Valium [diazepam] [name]   Zestril [lisinopril] Anaphylaxis, Swelling   Tongue and mouth swelling Laryngeal Edema   Tolectin [tolmetin] Nausea And Vomiting, Nausea Only, Other (See Comments)   Irritates stomach ulcer   Asa [aspirin] Other (See Comments)   Hx of stomach ulcer   Aspartame And Phenylalanine Hives   Bentyl [dicyclomine] Other (See Comments)   Chest pain   Hibiclens [chlorhexidine Gluconate] Other (See Comments)   Dermatitis    Flexeril [cyclobenzaprine] Palpitations   Oxycontin [oxycodone] Palpitations   Rifamycins Palpitations   Tylenol [acetaminophen] Nausea And Vomiting,  Nausea Only, Other (See Comments)   Irritates stomach ulcer Abdominal pain   Ultram [tramadol] Nausea And Vomiting, Palpitations        Medication List     STOP taking these medications    carvedilol 12.5 MG tablet Commonly known as: COREG   hydrALAZINE 50 MG tablet Commonly known as: APRESOLINE   hydrOXYzine 25 MG capsule Commonly known as: VISTARIL   isosorbide mononitrate 60 MG 24 hr tablet Commonly known as: IMDUR   spironolactone 25 MG tablet Commonly known as: ALDACTONE       TAKE these medications    (feeding supplement) PROSource Plus liquid Take 30 mLs by mouth 2 (two) times daily between meals.   nutrition supplement (JUVEN) Pack Take 1 packet by mouth 2 (two) times daily between meals.   Acidophilus Caps capsule Take 1 capsule by mouth 3 (three) times daily with meals.   albuterol (2.5 MG/3ML) 0.083% nebulizer solution Commonly known as: PROVENTIL Take 3 mLs (2.5 mg total) by nebulization every 6 (six) hours as needed for wheezing or shortness of breath.   allopurinol 100 MG tablet Commonly known as: ZYLOPRIM Take 1 tablet (100 mg total) by mouth 2 (two) times daily.   ascorbic acid 500 MG tablet Commonly known as: VITAMIN C Take 500 mg by mouth daily.   atorvastatin 10 MG tablet Commonly known as: LIPITOR Take 10 mg by mouth every evening.   busPIRone 5 MG tablet Commonly known as: BUSPAR Take 1 tablet (5 mg total) by mouth 3 (three) times daily.   cetirizine 10 MG tablet Commonly known as: ZYRTEC Take 10 mg by mouth daily.   famotidine 20 MG tablet Commonly known as: PEPCID Take 20 mg by mouth daily before breakfast.   ferric citrate 1 GM 210 MG(Fe) tablet Commonly known as: AURYXIA Take 2 tablets (420 mg total) by mouth 3 (three) times daily with meals. What changed: how much to take   fluticasone 50 MCG/ACT nasal spray Commonly known as: FLONASE Place 2 sprays into both nostrils daily as needed for allergies or rhinitis.    folic acid 1 MG tablet Commonly known as: FOLVITE Take 1 tablet (1 mg total) by mouth daily.   HYDROmorphone 2 MG tablet Commonly known as: DILAUDID Take 1 tablet (2 mg total) by mouth every 6 (six) hours as needed for severe pain (pain score 7-10) or moderate pain (pain score 4-6). What changed:  how much to take how to take this when to take this reasons to take this additional instructions   hydrOXYzine 25 MG tablet Commonly known as: ATARAX Take 1 tablet (25 mg total) by mouth 3 (three) times daily as needed for anxiety. What changed:  when to take this additional instructions   insulin lispro 100 UNIT/ML KwikPen Commonly known as: HUMALOG Before each meal 3 times a day, 140-199 - 2 units, 200-250 - 6 units, 251-299 - 8 units,  300-349 - 12 units,  350 or above 14 units. What changed:  how much to take how to take this when to take this additional instructions   leptospermum manuka honey Pste paste Apply 1 Application topically daily.   loperamide 2 MG capsule Commonly known as: IMODIUM Take 1 capsule (2 mg total) by mouth as needed for diarrhea or loose stools.   methocarbamol 500 MG tablet Commonly known as: ROBAXIN Take 1 tablet (500 mg total) by mouth every 6 (six) hours as needed for muscle spasms.   metoprolol tartrate 25 MG tablet Commonly known as: LOPRESSOR Take 1 tablet (25 mg total) by mouth 2 (two) times daily.   midodrine 10 MG tablet Commonly known as: PROAMATINE Take 1 tablet (10 mg total) by mouth 3 (three) times daily. What changed:  medication strength how much to take   midodrine 10 MG tablet Commonly known as: PROAMATINE Take 1 tablet (10 mg total) by mouth Every Tuesday,Thursday,and Saturday with dialysis. Start taking on: May 05, 2023 What changed: You were already taking a medication with the same name, and this prescription was added. Make sure you understand how and when to take each.   mirtazapine 15 MG tablet Commonly  known as: REMERON Take 15 mg by mouth at bedtime.   ondansetron 4 MG disintegrating tablet Commonly known as: ZOFRAN-ODT Take 1 tablet (4 mg total) by mouth every 8 (eight) hours as needed for nausea or vomiting.   pantoprazole 40 MG tablet Commonly known as: PROTONIX Take 1 tablet (40 mg total) by mouth 2 (two) times daily.   risperiDONE 1 MG disintegrating tablet Commonly known as: RISPERDAL M-TABS Take 1 tablet (1 mg total) by mouth at bedtime.   SENIOR MULTIVITAMIN PLUS PO Take 1 tablet by mouth daily.               Discharge Care Instructions  (From admission, onward)           Start     Ordered   05/04/23 0000  Discharge wound care:       Comments: Pressure Injury 11/23/22 Ischial tuberosity Left Unstageable - Full thickness tissue loss in which the base of the injury is covered by slough (yellow, tan, gray, green or brown) and/or eschar (tan, brown or black) in the wound bed. 162 days    Pressure Injury 11/24/22 Ischial tuberosity Right Stage 2 -  Partial thickness loss of dermis presenting as a shallow open injury with a red, pink wound bed without slough. stage two injury to upper thigh into groin region. 160 days      Wound Care Orders (From admission, onward)      Start     Ordered   04/26/23 1430    Wound care  Daily      Comments: Clean L lateral heel wound with Vashe wound cleanser Hart Rochester [phone]), apply Medihoney to wound bed daily, cover with dry gauze and silicone foam or dry gauze and Kerlix roll gauze whichever patient prefers.   05/04/23  1102           Allergies  Allergen Reactions   Gabapentin Hives and [name]   Isovue [Iopamidol] Anaphylaxis, [name] and Other (See Comments)    11/28/17 Patient had seizure like activity and then 1 min code after 100 cc of isovue 300. Possible contrast allergy vs vasovagal episode  Cardiac Arrest   Nsaids Anaphylaxis and Other (See Comments)    Hx of stomach ulcers    Penicillins Itching, Palpitations and Other (See Comments)    Flushing (Red Skin) Laryngeal Edema   Reglan [Metoclopramide] Other (See Comments)    Tardive dyskinesia    Valium [Diazepam] [name]   Zestril [Lisinopril] Anaphylaxis and Swelling    Tongue and mouth swelling Laryngeal Edema   Tolectin [Tolmetin] Nausea And Vomiting, Nausea Only and Other (See Comments)    Irritates stomach ulcer   Asa [Aspirin] Other (See Comments)    Hx of stomach ulcer   Aspartame And Phenylalanine Hives   Bentyl [Dicyclomine] Other (See Comments)    Chest pain   Hibiclens [Chlorhexidine Gluconate] Other (See Comments)    Dermatitis    Flexeril [Cyclobenzaprine] Palpitations   Oxycontin [Oxycodone] Palpitations   Rifamycins Palpitations   Tylenol [Acetaminophen] Nausea And Vomiting, Nausea Only and Other (See Comments)    Irritates stomach ulcer Abdominal pain   Ultram [Tramadol] Nausea And Vomiting and Palpitations    Contact information for follow-up providers     [facility] [employer]. Go on 05/05/2023.   Why: Schedule is Tuesday, Thursday, Saturday with 12:10 pm chair time. Contact information: [address] [place] [address] [phone]         MD AT SNF Follow up.          Nadara Mustard, MD. Schedule an appointment as soon as possible for a visit in 2 week(s).   Specialty: Orthopedic Surgery Contact information: [address] [place] [address] [phone]              Contact information for after-discharge care     Destination     [facility] [place] Preferred SNF .   Service: Skilled Nursing Contact information: [address] [place] [address] [phone]                      The results of significant diagnostics from this hospitalization (including imaging, microbiology, ancillary and laboratory) are listed below for reference.    Significant Diagnostic Studies: CT  CHEST ABDOMEN PELVIS WO CONTRAST Result Date: 04/25/2023 CLINICAL DATA:  Sepsis, chest pain, abdominal pain EXAM: CT CHEST, ABDOMEN AND PELVIS WITHOUT CONTRAST TECHNIQUE: Multidetector CT imaging of the chest, abdomen and pelvis was performed following the standard protocol without IV contrast. RADIATION DOSE REDUCTION: This exam was performed according to the departmental dose-optimization program which includes automated exposure control, adjustment of the mA and/or kV according to patient size and/or use of iterative reconstruction technique. COMPARISON:  03/22/2023 FINDINGS: CT CHEST FINDINGS Cardiovascular: Right side dialysis catheter again noted, unchanged. Heart is normal size. Scattered coronary artery calcifications. Aorta normal caliber. Mediastinum/Nodes: No mediastinal, hilar, or axillary adenopathy. Trachea and esophagus are unremarkable. Thyroid unremarkable. Lungs/Pleura: Scattered ground-glass opacities, likely areas of atelectasis. No confluent airspace opacities or effusions. Musculoskeletal: Chest wall soft tissues are unremarkable. No acute bony abnormality. CT ABDOMEN PELVIS FINDINGS Hepatobiliary: No focal liver abnormality is seen. Status post cholecystectomy. No biliary dilatation. Pancreas: No focal abnormality or ductal dilatation. Spleen:  No focal abnormality.  Normal size. Adrenals/Urinary Tract: No adrenal abnormality. No focal renal abnormality. No stones or hydronephrosis. Urinary bladder is unremarkable. Stomach/Bowel: Stomach, large and small bowel grossly unremarkable. Vascular/Lymphatic: No evidence of aneurysm or adenopathy. Reproductive: Stable uterine fibroid.  No acute findings. Other: No free fluid or free air. Musculoskeletal: No acute bony abnormality. IMPRESSION: No acute findings in the chest, abdomen or pelvis. Scattered aortic atherosclerosis crash that scattered coronary artery disease. Scattered ground-glass opacities in the lungs, favor areas of atelectasis. Fibroid  uterus. Electronically Signed   By: Charlett Nose M.D.   On: 04/25/2023 18:11   DG Chest 2 View Result Date: 04/25/2023 CLINICAL DATA:  Chest pain. EXAM: CHEST - 2 VIEW COMPARISON:  03/22/2023. FINDINGS: Bilateral lung fields are clear. No pulmonary edema. Bilateral costophrenic angles are clear. Normal cardio-mediastinal silhouette. No acute osseous abnormalities. The soft tissues are within normal limits. Right IJ hemodialysis catheter noted with its tip overlying the cavoatrial junction region, unchanged. IMPRESSION: No active cardiopulmonary disease. Electronically Signed   By: Jules Schick M.D.   On: 04/25/2023 15:57    Microbiology: Recent Results (from the past 240 hours)  Gastrointestinal Panel by PCR , Stool     Status: None   Collection Time: 05/01/23 12:07 AM   Specimen: Stool  Result Value Ref Range Status   Campylobacter species NOT DETECTED NOT DETECTED Final   Plesimonas shigelloides NOT DETECTED NOT DETECTED Final   Salmonella species NOT DETECTED NOT DETECTED Final   Yersinia enterocolitica NOT DETECTED NOT DETECTED Final   Vibrio species NOT DETECTED NOT DETECTED Final   Vibrio cholerae NOT DETECTED NOT DETECTED Final   Enteroaggregative E coli (EAEC) NOT DETECTED NOT DETECTED Final   Enteropathogenic E coli (EPEC) NOT DETECTED NOT DETECTED Final   Enterotoxigenic E coli (ETEC) NOT DETECTED NOT DETECTED Final   Shiga like toxin producing E coli (STEC) NOT DETECTED NOT DETECTED Final   Shigella/Enteroinvasive E coli (EIEC) NOT DETECTED NOT DETECTED Final   Cryptosporidium NOT DETECTED NOT DETECTED Final   Cyclospora cayetanensis NOT DETECTED NOT DETECTED Final   Entamoeba histolytica NOT DETECTED NOT DETECTED Final   Giardia lamblia NOT DETECTED NOT DETECTED Final   Adenovirus F40/41 NOT DETECTED NOT DETECTED Final   Astrovirus NOT DETECTED NOT DETECTED Final   Norovirus GI/GII NOT DETECTED NOT DETECTED Final   Rotavirus A NOT DETECTED NOT DETECTED Final   Sapovirus  (I, II, IV, and V) NOT DETECTED NOT DETECTED Final    Comment: Performed at [facility], [address] [place], [place] [address]     Labs: Basic Metabolic Panel: Recent Labs  Lab 04/28/23 0510 04/29/23 0900 04/30/23 0440 05/02/23 0639 05/03/23 1200  NA 139 135 138 142 138  K 2.9* 3.0* 3.1* 3.4* 3.6  CL 103 99 96* 108 104  CO2 21* 25 24 20* 20*  GLUCOSE 96 93 97 161* 135*  BUN 27* 12 16 14  21*  CREATININE 6.80* 4.70* 5.74* 5.77* 6.84*  CALCIUM 8.9 8.5* 9.7 8.9 9.2  MG 2.1  --   --   --   --   PHOS 7.0*  --  5.5* 5.0* 6.2*   Liver Function Tests: Recent Labs  Lab 04/28/23 0510 04/30/23 0440 05/02/23 0639 05/03/23 1200  ALBUMIN 2.5* 2.4* 2.7* 2.8*   No results for input(s): "LIPASE", "AMYLASE" in the last 168 hours. No results for input(s): "AMMONIA" in the last 168 hours. CBC: Recent Labs  Lab 04/29/23 0900 04/30/23 0440 05/01/23 0818 05/02/23  1610 05/03/23 1200  WBC 9.5 10.1 9.5 11.8* 14.0*  NEUTROABS  --  6.4  --  7.1  --   HGB 7.5* 7.5* 7.0* 8.2* 8.6*  HCT 24.3* 23.9* 22.9* 27.8* 28.7*  MCV 94.9 95.2 96.6 100.4* 97.3  PLT 304 301 268 333 335   Cardiac Enzymes: No results for input(s): "CKTOTAL", "CKMB", "CKMBINDEX", "TROPONINI" in the last 168 hours. BNP: BNP (last 3 results) Recent Labs    06/18/22 1817 11/23/22 0023 12/14/22 1700  BNP 267.3* 120.4* 368.9*    ProBNP (last 3 results) No results for input(s): "PROBNP" in the last 8760 hours.  CBG: Recent Labs  Lab 05/03/23 0636 05/03/23 1142 05/03/23 1813 05/03/23 2157 05/04/23 0619  GLUCAP 136* 130* 205* 268* 173*       Signed:  Ramiro Harvest MD.  [facility] 05/04/2023, 12:12 PM

## 2023-05-04 NOTE — TOC Transition Note (Signed)
 Transition of Care [facility]) - Discharge Note   Patient Details  Name: Deborah [name] MRN: [id number] Date of Birth: 07-11-1965  Transition of Care [facility]) CM/SW Contact:  Lorri Frederick, LCSW Phone Number: 05/04/2023, 11:31 AM   Clinical Narrative:   Pt discharging to [facility].  RN call report to [phone].    1000: CSW confirmed with [facility] that they can receive pt today.   Final next level of care: Skilled Nursing Facility Barriers to Discharge: Barriers Resolved   Patient Goals and CMS Choice Patient states their goals for this hospitalization and ongoing recovery are:: get someplace safe          Discharge Placement              Patient [facility] bed at: [facility] Patient to be transferred to facility by: [facility] Name of family member notified: left message with mothre, Tobi Bastos Patient and family notified of of transfer: 05/04/23  Discharge Plan and Services Additional resources added to the After Visit Summary for   In-house Referral: Clinical Social Work   Post Acute Care Choice: Skilled Nursing Facility                               Social Drivers of Health (SDOH) Interventions SDOH Screenings   Food Insecurity: No Food Insecurity (04/25/2023)  Recent Concern: Food Insecurity - High Risk (01/25/2023)   Received from [facility]  Housing: High Risk (04/25/2023)  Transportation Needs: Unmet Transportation Needs (04/25/2023)  Utilities: Not At Risk (04/25/2023)  Recent Concern: Utilities - Medium Risk (01/25/2023)   Received from [facility]  Depression (PHQ2-9): Medium Risk (03/16/2023)  Financial Resource Strain: Low Risk (03/23/2021)   [facility])  Physical Activity: Not on File (10/31/2017)   Received from [place], [place]  Social Connections: Unknown (07/19/2021)   [facility] from [facility], [facility]  Stress: Low Risk (03/23/2021)   [facility]  [facility]  Tobacco Use: Low Risk  (04/25/2023)     Readmission Risk Interventions    03/07/2023    4:19 PM 11/25/2022    5:16 PM 07/05/2022    1:20 PM  Readmission Risk Prevention Plan  Transportation Screening Complete Complete Complete  Medication Review (RN Care Manager) Complete Complete Referral to Pharmacy  PCP or Specialist appointment within 3-5 days of discharge Complete Complete Complete  HRI or Home Care Consult Complete Complete Complete  SW Recovery Care/Counseling Consult Complete Complete Complete  Palliative Care Screening Complete Not Applicable Not Applicable  Skilled Nursing Facility Not Applicable Not Applicable Not Applicable

## 2023-05-09 ENCOUNTER — Ambulatory Visit: Payer: 59 | Admitting: Registered Nurse

## 2023-05-18 ENCOUNTER — Ambulatory Visit: Admitting: Family

## 2023-05-23 IMAGING — CT CT ABD-PELV W/O CM
2 of 4 series · 16 of 46 positions shown, 18 images · non-contrast
Comparison: CT the abdomen and pelvis 01/31/2021.

CLINICAL DATA: 56-year-old female with history of generalized
abdominal pain.



[Series 2: axial st · axial · 0.83mm/px · z∈[-436,-36]mm · 13 of 92 slices shown, 15 images]
[im 6/92  soft-tissue]
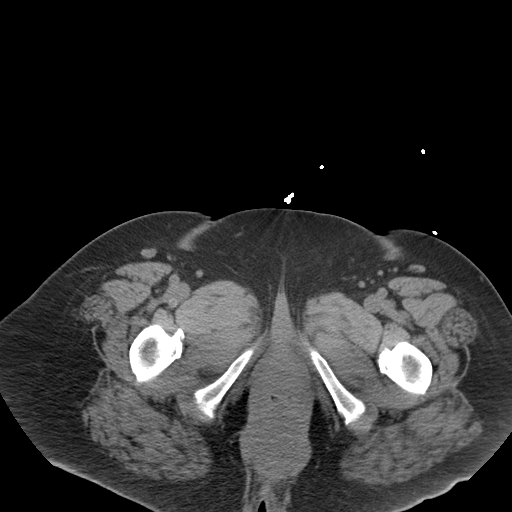
[im 6/92  bone]
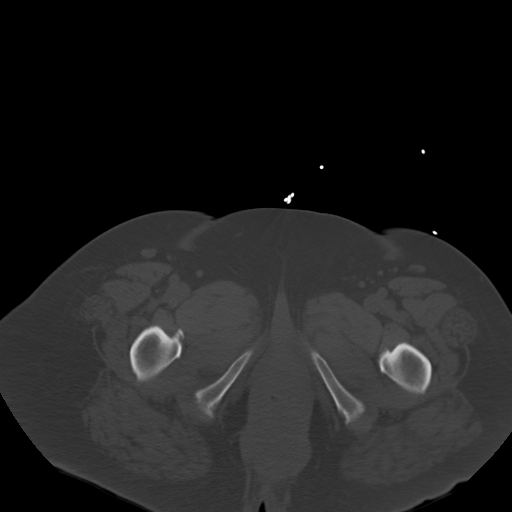
[im 11/92  soft-tissue]
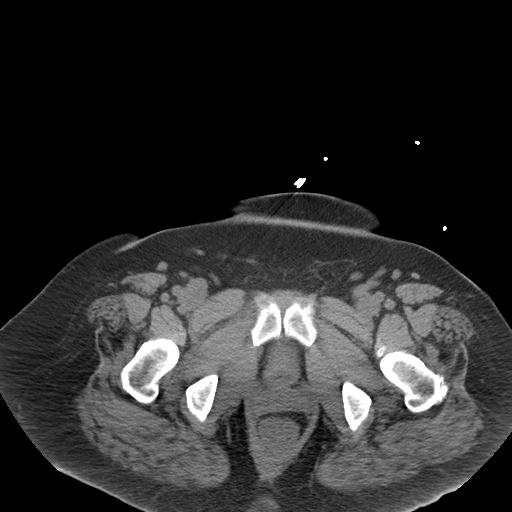
[im 21/92  soft-tissue]
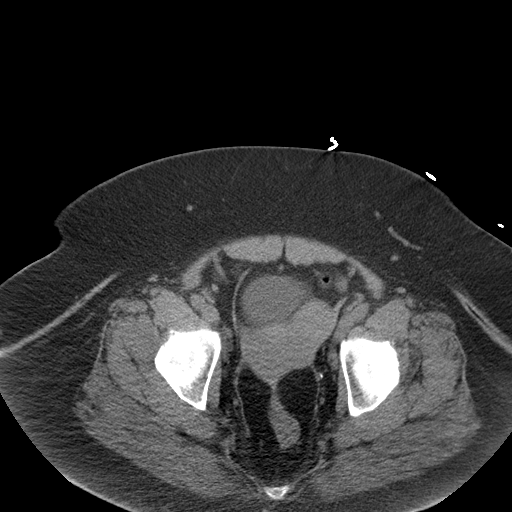
[im 26/92  soft-tissue]
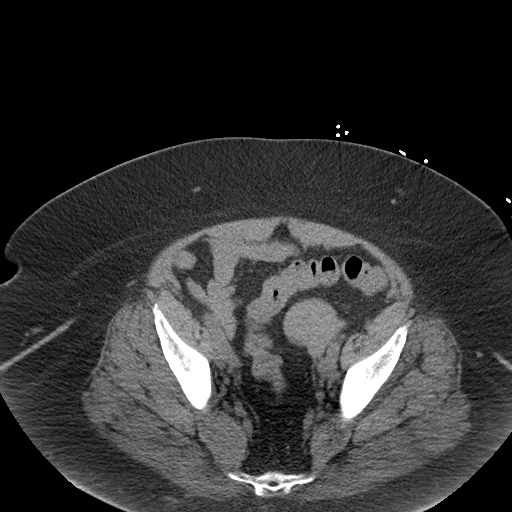
[im 31/92  soft-tissue]
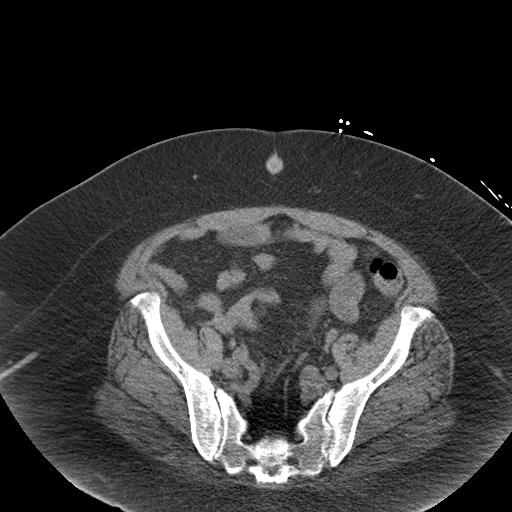
[im 41/92  soft-tissue]
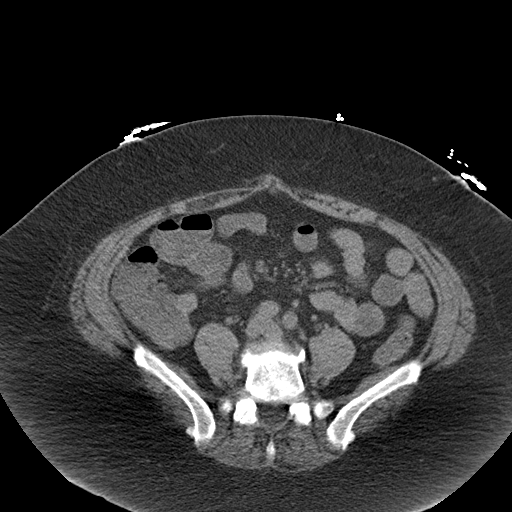
[im 46/92  soft-tissue]
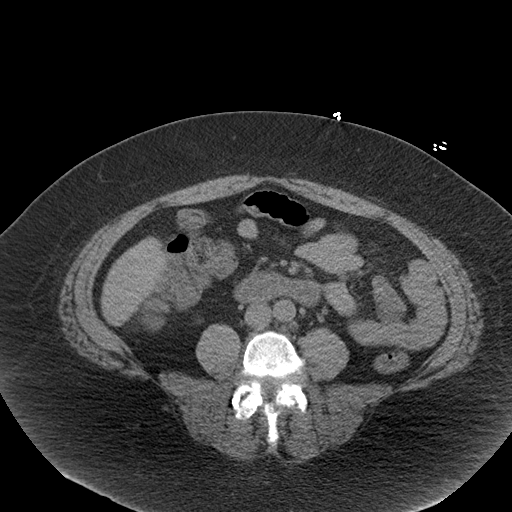
[im 51/92  soft-tissue]
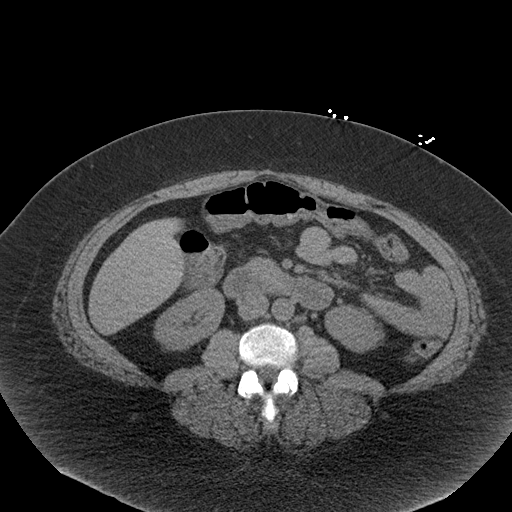
[im 61/92  soft-tissue]
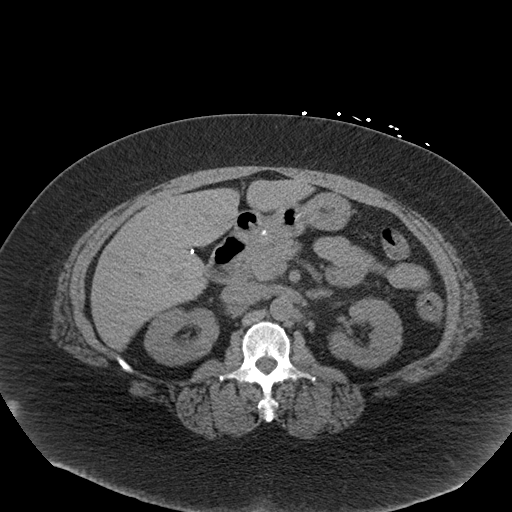
[im 61/92  bone]
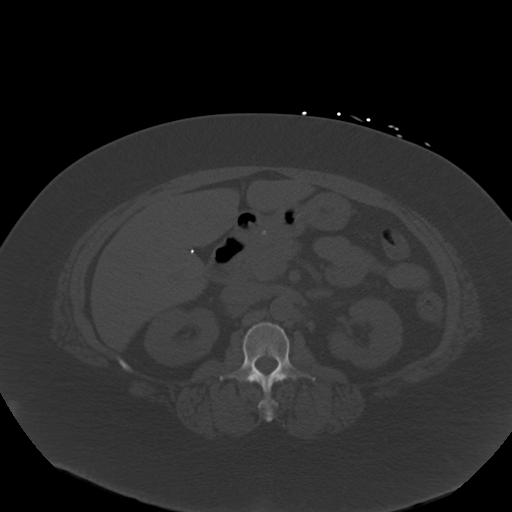
[im 66/92  soft-tissue]
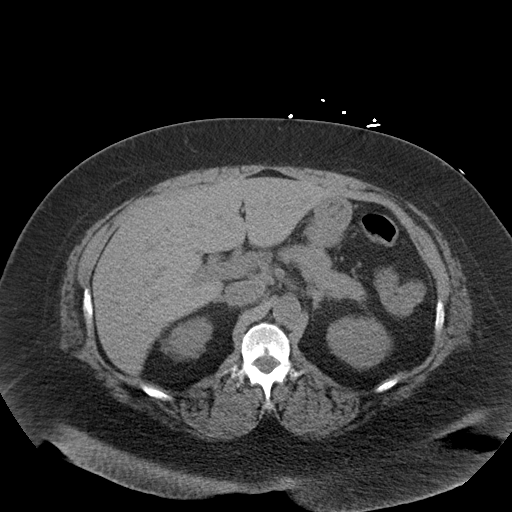
[im 71/92  soft-tissue]
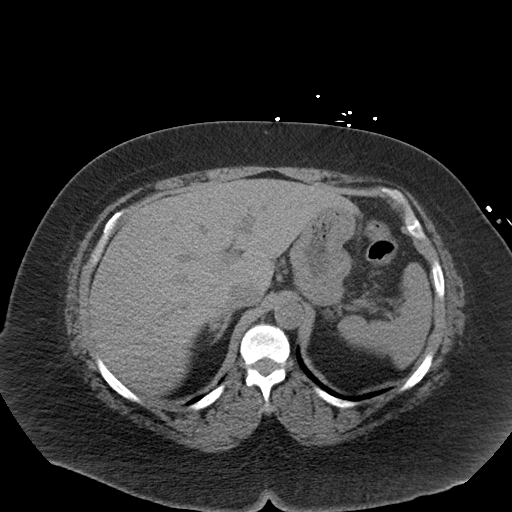
[im 81/92  soft-tissue]
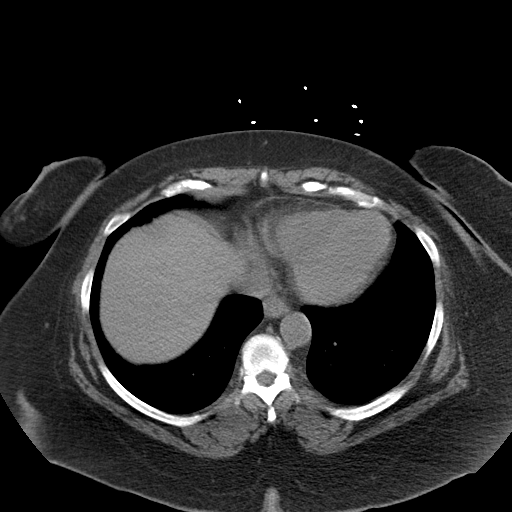
[im 86/92  soft-tissue]
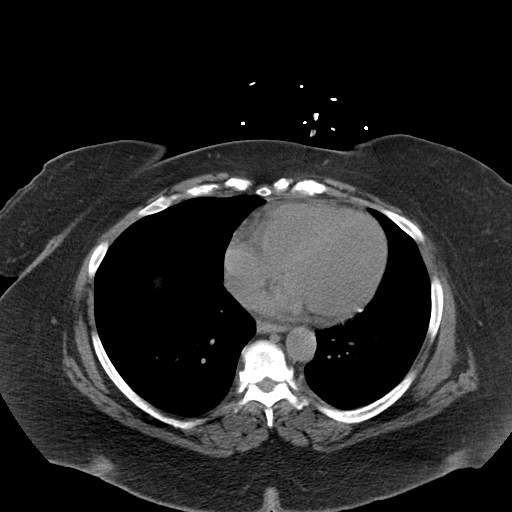

[Series 5: coronal st · coronal · 0.89mm/px · 3 of 170 slices shown]
[im 57/170  soft-tissue]
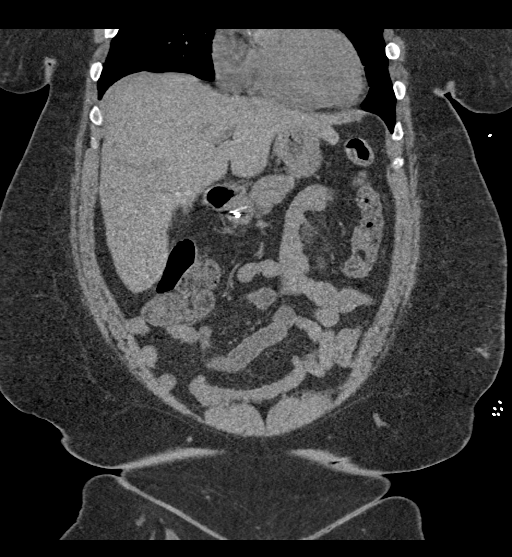
[im 76/170  soft-tissue]
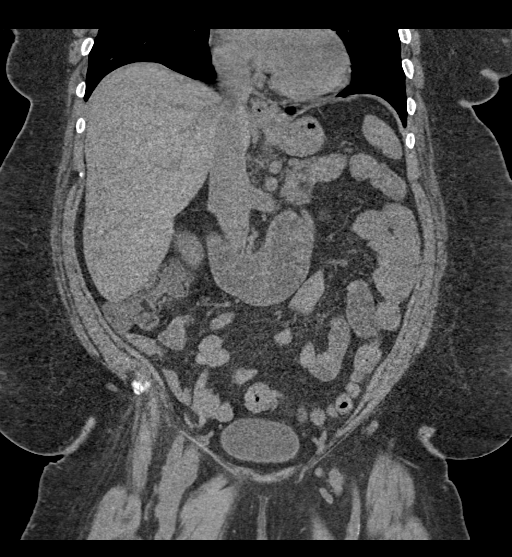
[im 94/170  soft-tissue]
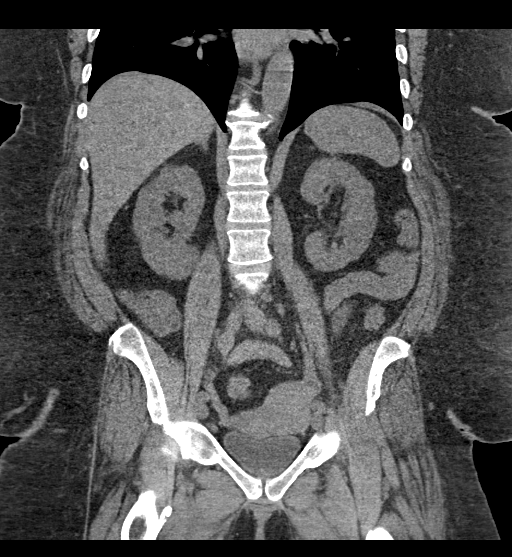

[16 of 46 positions shown; findings below may reference images not displayed]

FINDINGS: Lower chest: Unremarkable.

Hepatobiliary: No definite suspicious cystic or solid hepatic
lesions confidently identified on today's noncontrast CT
examination. Status post cholecystectomy.

Pancreas: No definite pancreatic mass or peripancreatic fluid
collections or inflammatory changes are noted on today's noncontrast
CT examination.

Spleen: Unremarkable.

Adrenals/Urinary Tract: There are no abnormal calcifications within
the collecting system of either kidney, along the course of either
ureter, or within the lumen of the urinary bladder. No
hydroureteronephrosis or perinephric stranding to suggest urinary
tract obstruction at this time. The unenhanced appearance of the
kidneys is unremarkable bilaterally. Unenhanced appearance of the
urinary bladder is normal. Bilateral adrenal glands are normal in
appearance.

Stomach/Bowel: Several metallic densities are noted in the antral
pre-pyloric region of the stomach, likely biopsy clips. Unenhanced
appearance of the stomach is otherwise normal. No pathologic
dilatation of small bowel or colon. Normal appendix.

Vascular/Lymphatic: Aortic atherosclerosis. No lymphadenopathy noted
in the abdomen or pelvis.

Reproductive: Exophytic lesion in the left side of the uterine
fundus measuring 4.9 cm in diameter, similar to the prior
examination, likely to represent an exophytic fibroid subserosal.
This obscures the left ovary. Right ovary is unremarkable in
appearance.

Other: No significant volume of ascites.  No pneumoperitoneum.

Musculoskeletal: There are no aggressive appearing lytic or blastic
lesions noted in the visualized portions of the skeleton.
IMPRESSION: 1. No acute findings are noted in the abdomen or pelvis to account
for the patient's symptoms.
2. Similar exophytic lesion associated with the left side of the
uterine fundus, favored to represent an exophytic fibroid, less
likely a left adnexal lesion. This could be better evaluated with
follow-up nonemergent pelvic ultrasound if of clinical concern.
3. Aortic atherosclerosis.
4. Additional incidental findings, as above.

## 2023-05-26 IMAGING — CT CT HEAD W/O CM
4 of 8 series · 17 of 47 positions shown, 18 images · non-contrast
Comparison: CT head 03/19/2017

CLINICAL DATA: Mental status change



[Series 3: head wo · axial · 0.47mm/px · z∈[+1509,+1614]mm · 4 of 37 slices shown, 5 images]
[im 8/37  brain]
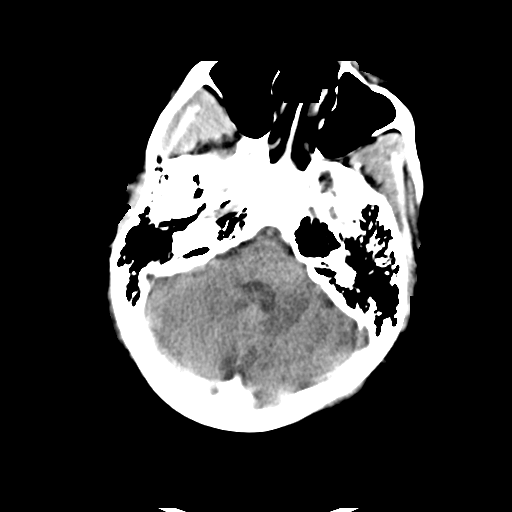
[im 8/37  bone]
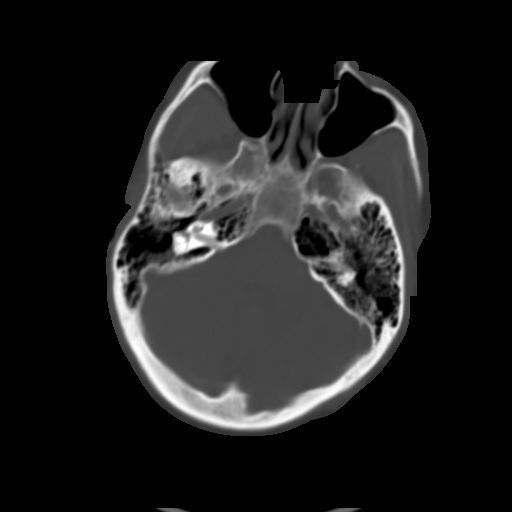
[im 15/37  brain]
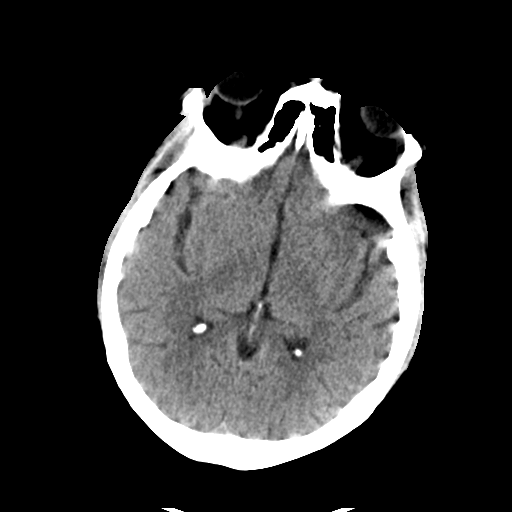
[im 22/37  brain]
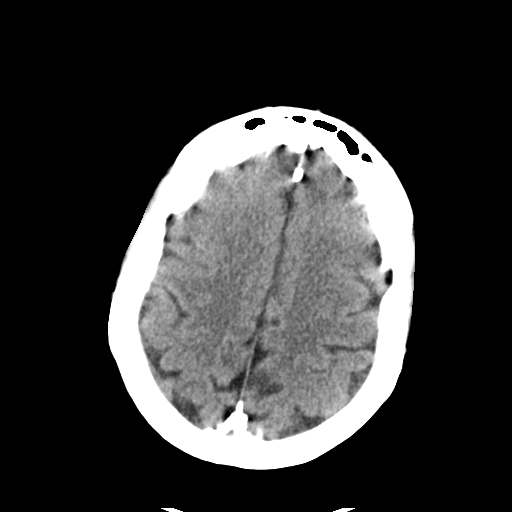
[im 29/37  brain]
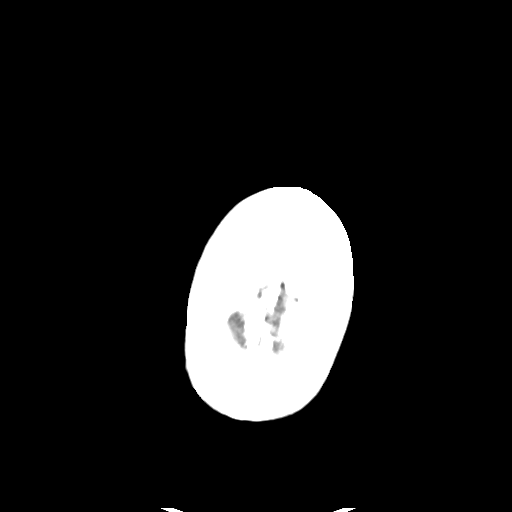

[Series 4: head bone · axial · 0.47mm/px · z∈[+1488,+1640]mm · 8 of 92 slices shown]
[im 8/92  bone]
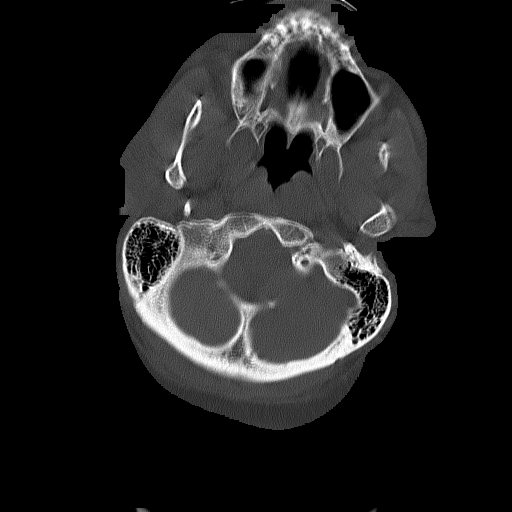
[im 23/92  bone]
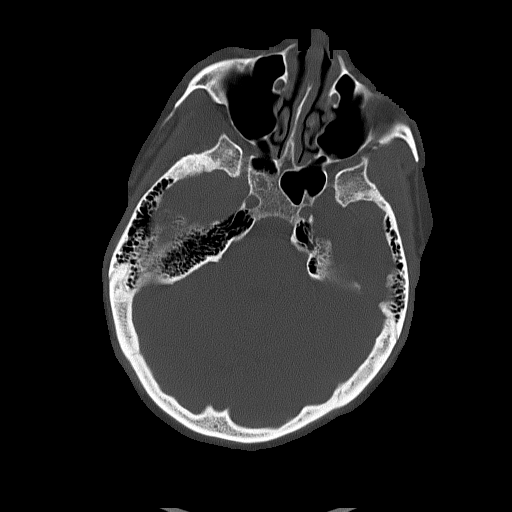
[im 31/92  bone]
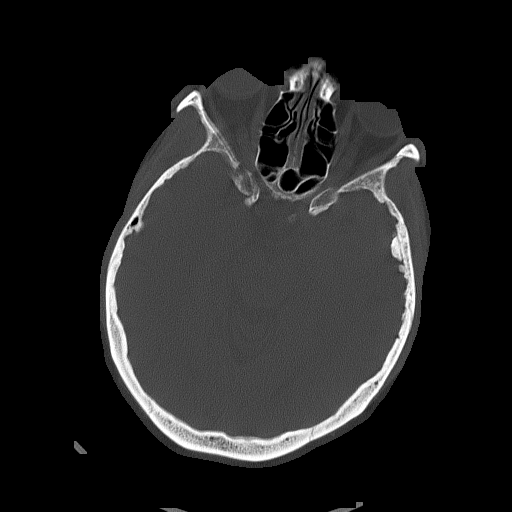
[im 38/92  bone]
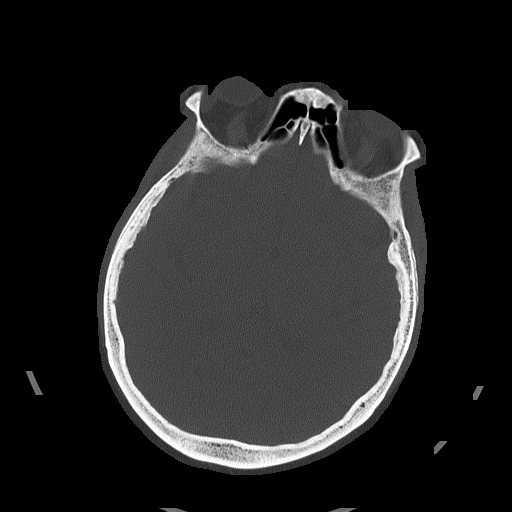
[im 54/92  bone]
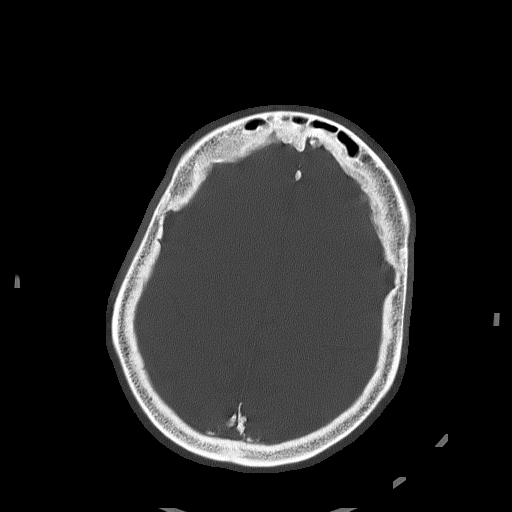
[im 61/92  bone]
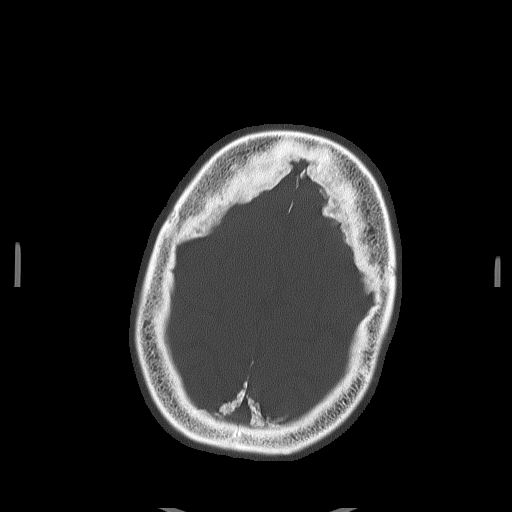
[im 69/92  bone]
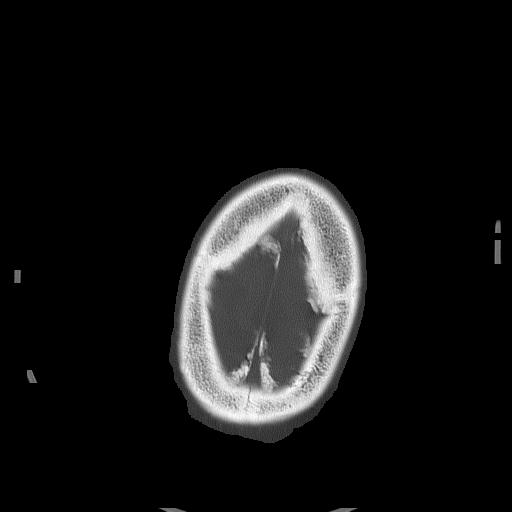
[im 84/92  bone]
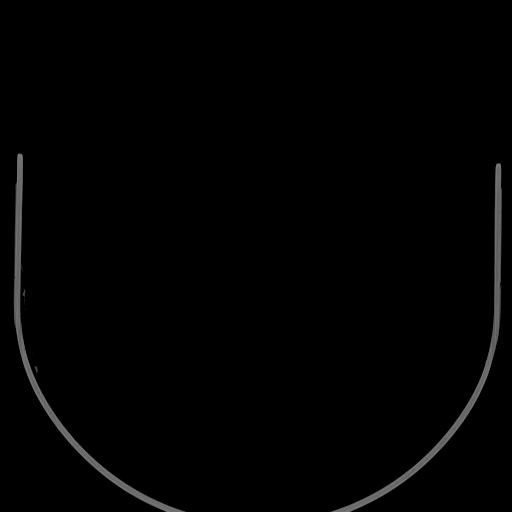

[Series 7: coronal soft tissue · coronal · 0.39mm/px · 3 of 67 slices shown]
[im 17/67  brain]
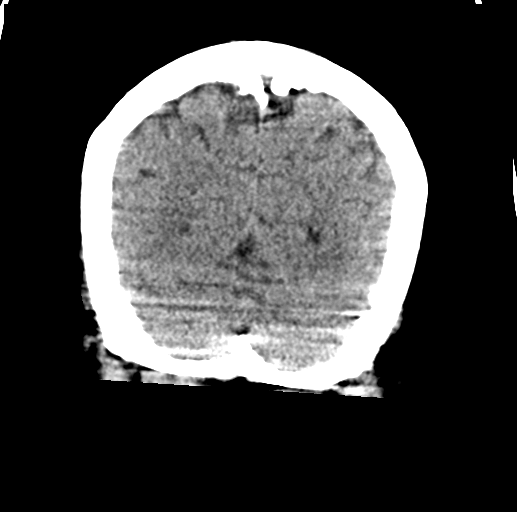
[im 34/67  brain]
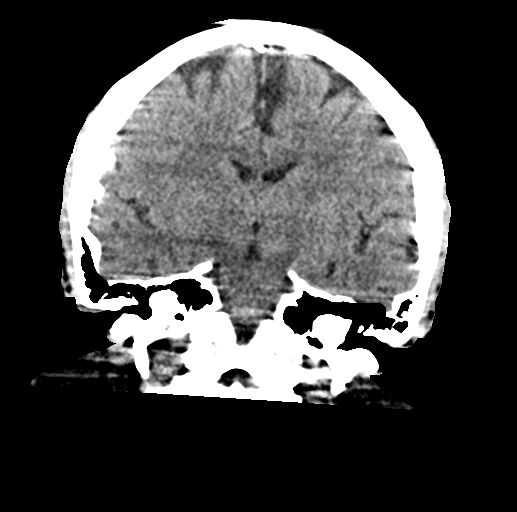
[im 50/67  brain]
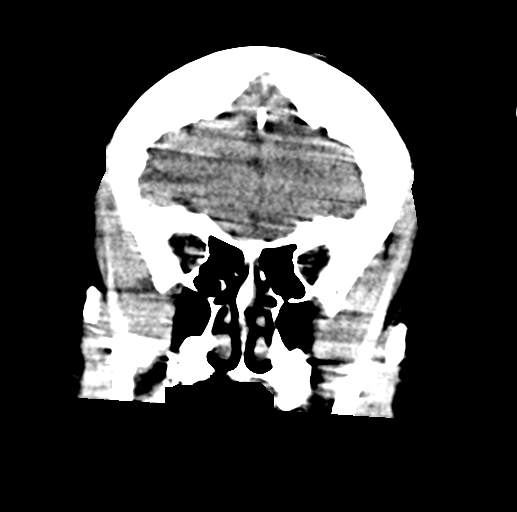

[Series 8: sagittal soft tissue · sagittal · 0.37mm/px · 2 of 54 slices shown]
[im 18/54  brain]
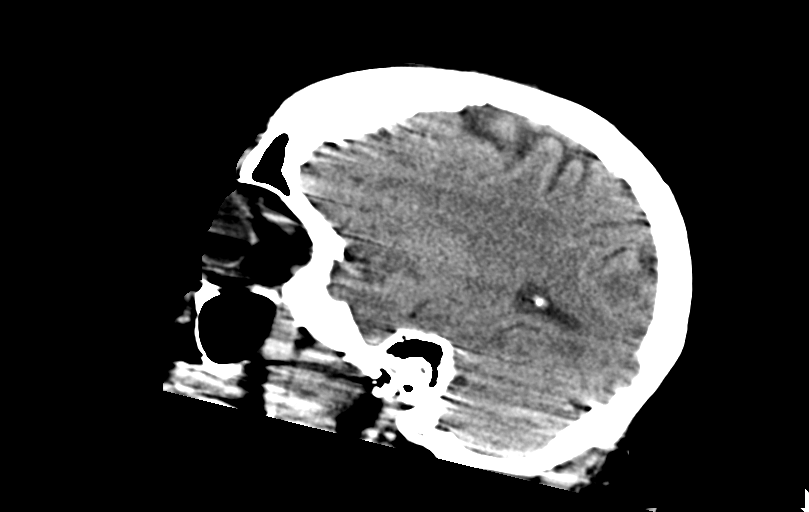
[im 36/54  brain]
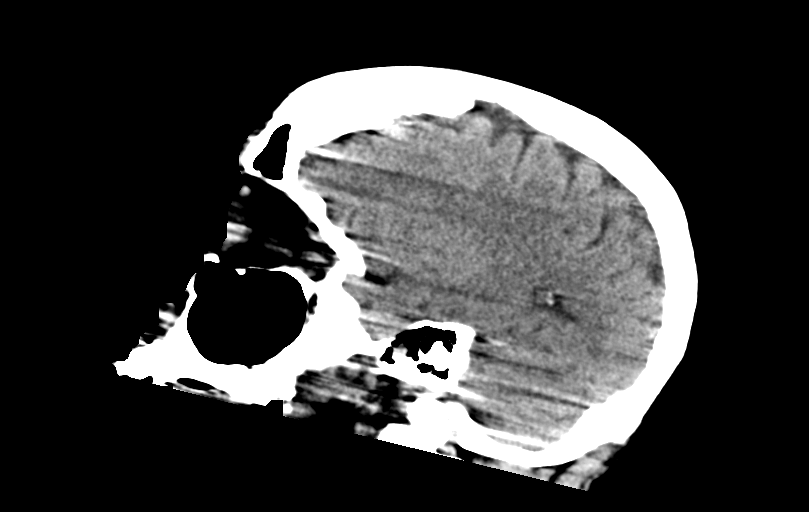

[17 of 47 positions shown; findings below may reference images not displayed]

FINDINGS: Brain: Motion degraded study. Two scans were obtained, both are
degraded by motion.

Ventricle size and cerebral volume normal. Negative for infarct,
hemorrhage, mass.

Vascular: Negative for hyperdense vessel

Skull: Negative

Sinuses/Orbits: Paranasal sinuses clear. No orbital lesion. Left
cataract extraction

Other: None
IMPRESSION: No acute abnormality

Motion degraded study.

## 2023-05-26 IMAGING — DX DG ABDOMEN ACUTE W/ 1V CHEST
4 series · 5 of 5 positions shown · non-contrast
Comparison: 11/23/2020

CLINICAL DATA: Abdominal pain

EXAM:
DG ABDOMEN ACUTE WITH 1 VIEW CHEST

[chest ap]
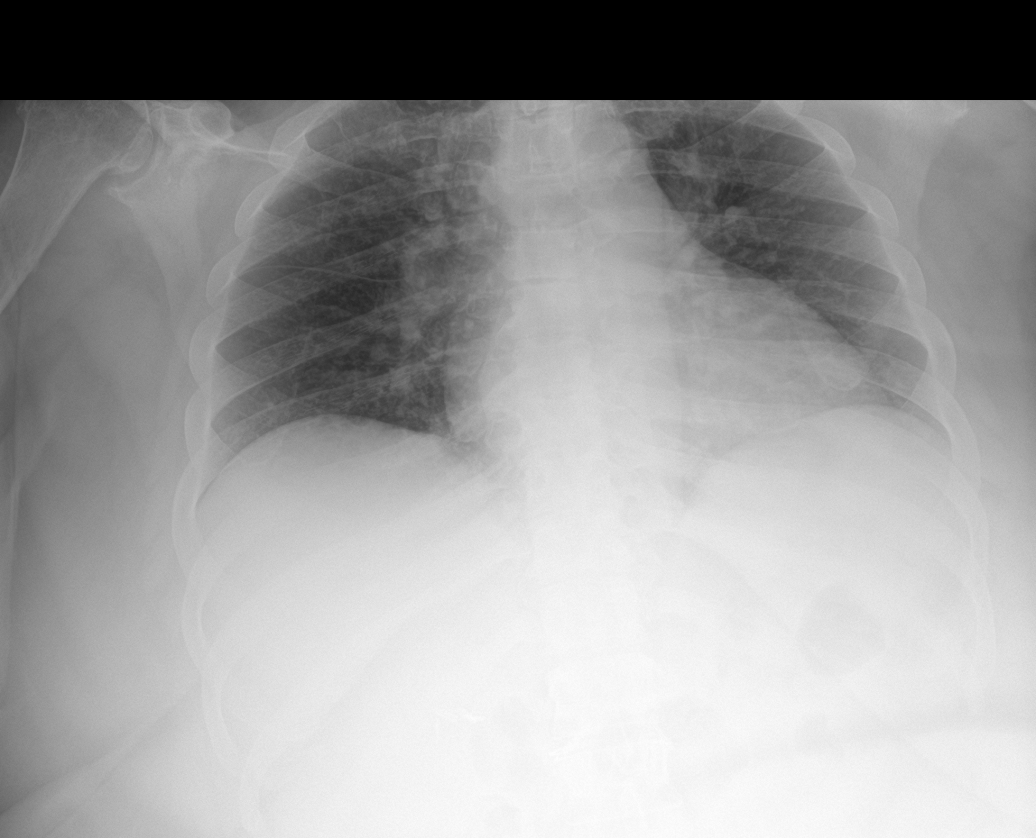

[Series 3: abdomen kub · 0.14mm/px · 2 of 2 slices shown (1 of 3)]
[im 1/2]
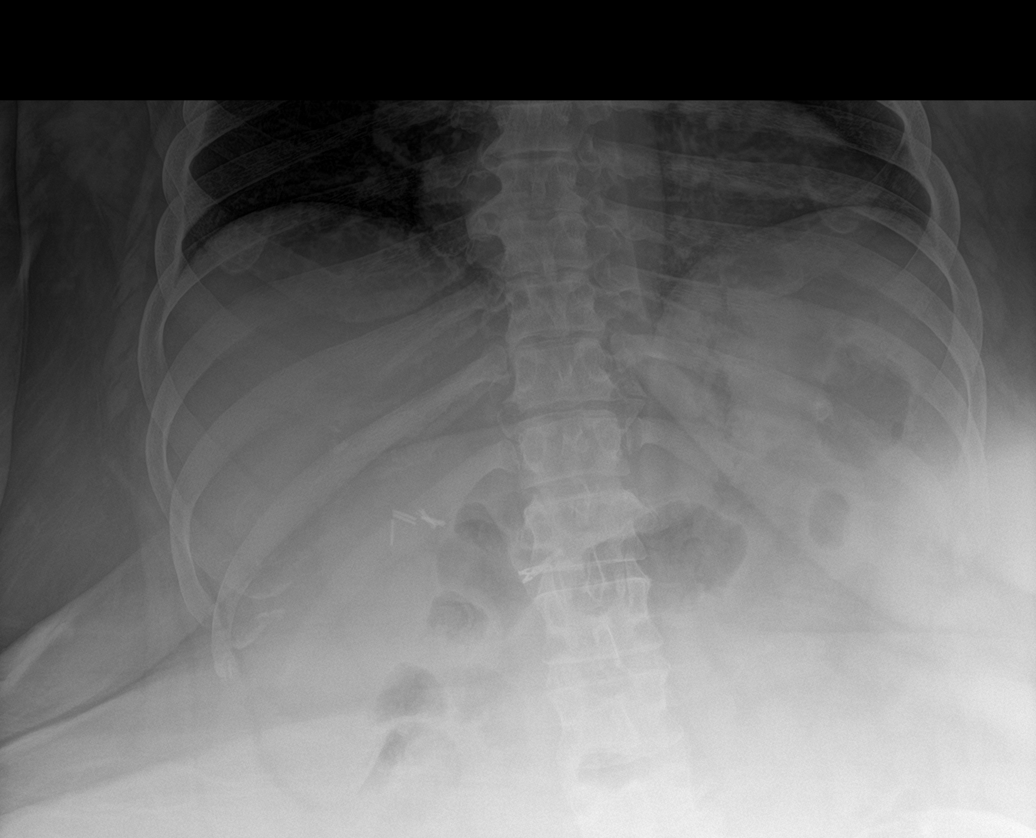
[im 2/2]
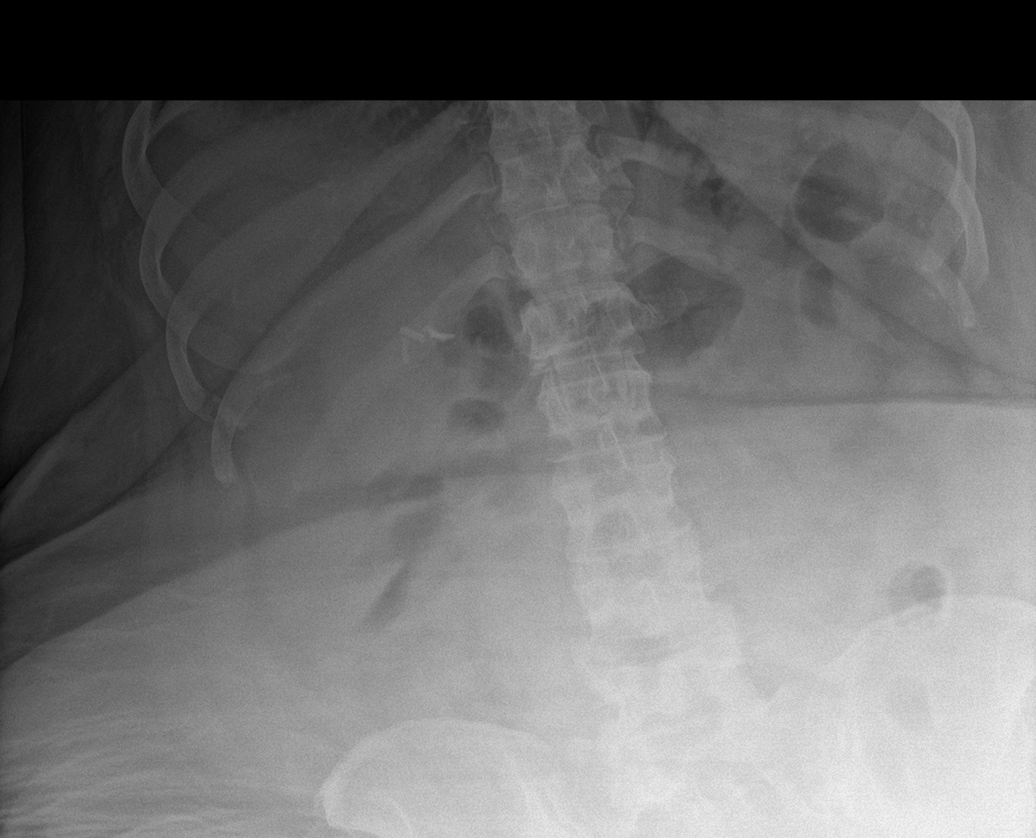

[abdomen kub (2 of 3)]
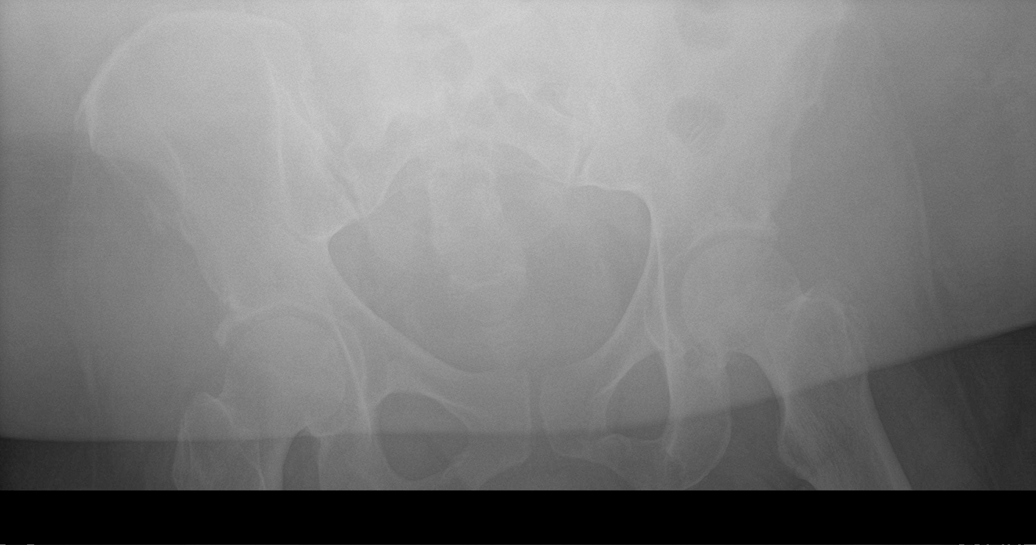

[abdomen kub (3 of 3)]
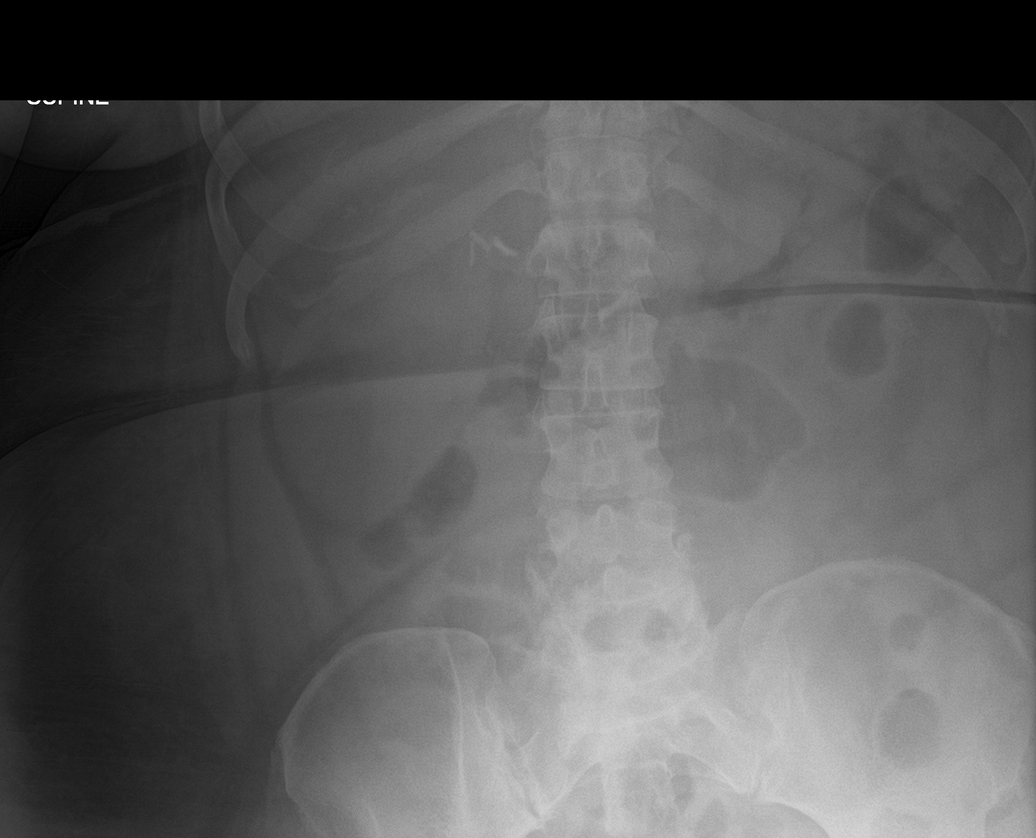

[5 of 5 positions shown; findings below may reference images not displayed]

FINDINGS: Transverse diameter of heart is increased. There are no signs of
pulmonary edema or focal pulmonary consolidation. There is no
pleural effusion or pneumothorax. Degenerative changes are noted in
the right shoulder.

Bowel gas pattern is nonspecific. Small amount of stool is seen in
colon. There is no fecal impaction in the rectosigmoid. Surgical
clips are seen in the right upper quadrant. No abnormal masses or
calcifications are seen. Degenerative changes are noted in the lower
lumbar spine.
IMPRESSION: Cardiomegaly. There are no focal infiltrates or signs of pulmonary
edema.

Bowel gas pattern is nonspecific.

## 2023-05-29 ENCOUNTER — Other Ambulatory Visit: Payer: Self-pay

## 2023-05-29 ENCOUNTER — Encounter (HOSPITAL_COMMUNITY): Payer: Self-pay

## 2023-05-29 ENCOUNTER — Inpatient Hospital Stay (HOSPITAL_COMMUNITY)
Admission: EM | Admit: 2023-05-29 | Discharge: 2023-06-20 | DRG: 291 | Disposition: A | Discharge status: Skilled Nursing Facility | Source: Skilled Nursing Facility | Attending: Internal Medicine | Admitting: Internal Medicine

## 2023-05-29 ENCOUNTER — Emergency Department (HOSPITAL_COMMUNITY)

## 2023-05-29 DIAGNOSIS — F32A Depression, unspecified: Secondary | ICD-10-CM | POA: Diagnosis present

## 2023-05-29 DIAGNOSIS — I5043 Acute on chronic combined systolic (congestive) and diastolic (congestive) heart failure: Secondary | ICD-10-CM | POA: Diagnosis present

## 2023-05-29 DIAGNOSIS — I5033 Acute on chronic diastolic (congestive) heart failure: Secondary | ICD-10-CM | POA: Diagnosis present

## 2023-05-29 DIAGNOSIS — Z794 Long term (current) use of insulin: Secondary | ICD-10-CM

## 2023-05-29 DIAGNOSIS — R45851 Suicidal ideations: Secondary | ICD-10-CM | POA: Diagnosis present

## 2023-05-29 DIAGNOSIS — E119 Type 2 diabetes mellitus without complications: Secondary | ICD-10-CM

## 2023-05-29 DIAGNOSIS — D638 Anemia in other chronic diseases classified elsewhere: Secondary | ICD-10-CM | POA: Diagnosis present

## 2023-05-29 DIAGNOSIS — R110 Nausea: Secondary | ICD-10-CM

## 2023-05-29 DIAGNOSIS — I132 Hypertensive heart and chronic kidney disease with heart failure and with stage 5 chronic kidney disease, or end stage renal disease: Secondary | ICD-10-CM | POA: Diagnosis not present

## 2023-05-29 DIAGNOSIS — Z992 Dependence on renal dialysis: Secondary | ICD-10-CM

## 2023-05-29 DIAGNOSIS — Z833 Family history of diabetes mellitus: Secondary | ICD-10-CM

## 2023-05-29 DIAGNOSIS — R079 Chest pain, unspecified: Secondary | ICD-10-CM | POA: Diagnosis not present

## 2023-05-29 DIAGNOSIS — Z89511 Acquired absence of right leg below knee: Secondary | ICD-10-CM

## 2023-05-29 DIAGNOSIS — E1143 Type 2 diabetes mellitus with diabetic autonomic (poly)neuropathy: Secondary | ICD-10-CM | POA: Diagnosis present

## 2023-05-29 DIAGNOSIS — F419 Anxiety disorder, unspecified: Secondary | ICD-10-CM | POA: Diagnosis present

## 2023-05-29 DIAGNOSIS — Z6841 Body Mass Index (BMI) 40.0 and over, adult: Secondary | ICD-10-CM

## 2023-05-29 DIAGNOSIS — Z8249 Family history of ischemic heart disease and other diseases of the circulatory system: Secondary | ICD-10-CM

## 2023-05-29 DIAGNOSIS — E1151 Type 2 diabetes mellitus with diabetic peripheral angiopathy without gangrene: Secondary | ICD-10-CM | POA: Diagnosis present

## 2023-05-29 DIAGNOSIS — Z765 Malingerer [conscious simulation]: Secondary | ICD-10-CM

## 2023-05-29 DIAGNOSIS — L97429 Non-pressure chronic ulcer of left heel and midfoot with unspecified severity: Secondary | ICD-10-CM | POA: Diagnosis present

## 2023-05-29 DIAGNOSIS — Z91119 Patient's noncompliance with dietary regimen due to unspecified reason: Secondary | ICD-10-CM

## 2023-05-29 DIAGNOSIS — N186 End stage renal disease: Secondary | ICD-10-CM

## 2023-05-29 DIAGNOSIS — E876 Hypokalemia: Secondary | ICD-10-CM | POA: Diagnosis present

## 2023-05-29 DIAGNOSIS — F17210 Nicotine dependence, cigarettes, uncomplicated: Secondary | ICD-10-CM | POA: Diagnosis present

## 2023-05-29 DIAGNOSIS — E1122 Type 2 diabetes mellitus with diabetic chronic kidney disease: Secondary | ICD-10-CM | POA: Diagnosis present

## 2023-05-29 DIAGNOSIS — N2581 Secondary hyperparathyroidism of renal origin: Secondary | ICD-10-CM | POA: Diagnosis present

## 2023-05-29 DIAGNOSIS — E785 Hyperlipidemia, unspecified: Secondary | ICD-10-CM | POA: Diagnosis present

## 2023-05-29 DIAGNOSIS — K529 Noninfective gastroenteritis and colitis, unspecified: Secondary | ICD-10-CM | POA: Diagnosis present

## 2023-05-29 DIAGNOSIS — K2101 Gastro-esophageal reflux disease with esophagitis, with bleeding: Secondary | ICD-10-CM | POA: Diagnosis present

## 2023-05-29 DIAGNOSIS — Z91158 Patient's noncompliance with renal dialysis for other reason: Secondary | ICD-10-CM

## 2023-05-29 DIAGNOSIS — I9589 Other hypotension: Secondary | ICD-10-CM | POA: Diagnosis present

## 2023-05-29 DIAGNOSIS — E66812 Obesity, class 2: Secondary | ICD-10-CM

## 2023-05-29 DIAGNOSIS — D631 Anemia in chronic kidney disease: Secondary | ICD-10-CM | POA: Diagnosis present

## 2023-05-29 DIAGNOSIS — T148XXA Other injury of unspecified body region, initial encounter: Secondary | ICD-10-CM

## 2023-05-29 DIAGNOSIS — Z91041 Radiographic dye allergy status: Secondary | ICD-10-CM

## 2023-05-29 DIAGNOSIS — E871 Hypo-osmolality and hyponatremia: Secondary | ICD-10-CM | POA: Diagnosis not present

## 2023-05-29 DIAGNOSIS — Z886 Allergy status to analgesic agent status: Secondary | ICD-10-CM

## 2023-05-29 DIAGNOSIS — Z880 Allergy status to penicillin: Secondary | ICD-10-CM

## 2023-05-29 DIAGNOSIS — I428 Other cardiomyopathies: Secondary | ICD-10-CM | POA: Diagnosis present

## 2023-05-29 DIAGNOSIS — M797 Fibromyalgia: Secondary | ICD-10-CM | POA: Diagnosis present

## 2023-05-29 DIAGNOSIS — E1165 Type 2 diabetes mellitus with hyperglycemia: Secondary | ICD-10-CM | POA: Diagnosis present

## 2023-05-29 DIAGNOSIS — K7581 Nonalcoholic steatohepatitis (NASH): Secondary | ICD-10-CM | POA: Diagnosis present

## 2023-05-29 DIAGNOSIS — C7A010 Malignant carcinoid tumor of the duodenum: Secondary | ICD-10-CM | POA: Diagnosis present

## 2023-05-29 DIAGNOSIS — K3184 Gastroparesis: Secondary | ICD-10-CM | POA: Diagnosis present

## 2023-05-29 DIAGNOSIS — I509 Heart failure, unspecified: Principal | ICD-10-CM

## 2023-05-29 DIAGNOSIS — I480 Paroxysmal atrial fibrillation: Secondary | ICD-10-CM | POA: Diagnosis present

## 2023-05-29 DIAGNOSIS — E11621 Type 2 diabetes mellitus with foot ulcer: Secondary | ICD-10-CM | POA: Diagnosis present

## 2023-05-29 DIAGNOSIS — M1A9XX0 Chronic gout, unspecified, without tophus (tophi): Secondary | ICD-10-CM | POA: Diagnosis present

## 2023-05-29 DIAGNOSIS — Z888 Allergy status to other drugs, medicaments and biological substances status: Secondary | ICD-10-CM

## 2023-05-29 DIAGNOSIS — G4700 Insomnia, unspecified: Secondary | ICD-10-CM | POA: Diagnosis present

## 2023-05-29 DIAGNOSIS — Z85060 Personal history of malignant carcinoid tumor of small intestine: Secondary | ICD-10-CM

## 2023-05-29 DIAGNOSIS — L97521 Non-pressure chronic ulcer of other part of left foot limited to breakdown of skin: Secondary | ICD-10-CM | POA: Diagnosis present

## 2023-05-29 DIAGNOSIS — I482 Chronic atrial fibrillation, unspecified: Secondary | ICD-10-CM | POA: Diagnosis present

## 2023-05-29 DIAGNOSIS — K219 Gastro-esophageal reflux disease without esophagitis: Secondary | ICD-10-CM | POA: Diagnosis present

## 2023-05-29 DIAGNOSIS — I444 Left anterior fascicular block: Secondary | ICD-10-CM | POA: Diagnosis present

## 2023-05-29 DIAGNOSIS — G894 Chronic pain syndrome: Secondary | ICD-10-CM | POA: Diagnosis present

## 2023-05-29 DIAGNOSIS — Z885 Allergy status to narcotic agent status: Secondary | ICD-10-CM

## 2023-05-29 DIAGNOSIS — Z79899 Other long term (current) drug therapy: Secondary | ICD-10-CM

## 2023-05-29 LAB — CBC WITH DIFFERENTIAL/PLATELET
Abs Immature Granulocytes: 0.07 10*3/uL (ref 0.00–0.07)
Basophils Absolute: 0.1 10*3/uL (ref 0.0–0.1)
Basophils Relative: 1 %
Eosinophils Absolute: 0.3 10*3/uL (ref 0.0–0.5)
Eosinophils Relative: 3 %
HCT: 32.1 % — ABNORMAL LOW (ref 36.0–46.0)
Hemoglobin: 9.5 g/dL — ABNORMAL LOW (ref 12.0–15.0)
Immature Granulocytes: 1 %
Lymphocytes Relative: 24 %
Lymphs Abs: 2.6 10*3/uL (ref 0.7–4.0)
MCH: 29.6 pg (ref 26.0–34.0)
MCHC: 29.6 g/dL — ABNORMAL LOW (ref 30.0–36.0)
MCV: 100 fL (ref 80.0–100.0)
Monocytes Absolute: 0.6 10*3/uL (ref 0.1–1.0)
Monocytes Relative: 5 %
Neutro Abs: 7.2 10*3/uL (ref 1.7–7.7)
Neutrophils Relative %: 66 %
Platelets: 362 10*3/uL (ref 150–400)
RBC: 3.21 MIL/uL — ABNORMAL LOW (ref 3.87–5.11)
RDW: 15.9 % — ABNORMAL HIGH (ref 11.5–15.5)
WBC: 10.8 10*3/uL — ABNORMAL HIGH (ref 4.0–10.5)
nRBC: 0 % (ref 0.0–0.2)

## 2023-05-29 LAB — LIPASE, BLOOD: Lipase: 43 U/L (ref 11–51)

## 2023-05-29 LAB — C DIFFICILE QUICK SCREEN W PCR REFLEX
C Diff antigen: NEGATIVE
C Diff interpretation: NOT DETECTED
C Diff toxin: NEGATIVE

## 2023-05-29 LAB — COMPREHENSIVE METABOLIC PANEL
ALT: 11 U/L (ref 0–44)
AST: 14 U/L — ABNORMAL LOW (ref 15–41)
Albumin: 2.2 g/dL — ABNORMAL LOW (ref 3.5–5.0)
Alkaline Phosphatase: 101 U/L (ref 38–126)
Anion gap: 13 (ref 5–15)
BUN: 35 mg/dL — ABNORMAL HIGH (ref 6–20)
CO2: 17 mmol/L — ABNORMAL LOW (ref 22–32)
Calcium: 8.3 mg/dL — ABNORMAL LOW (ref 8.9–10.3)
Chloride: 107 mmol/L (ref 98–111)
Creatinine, Ser: 8.22 mg/dL — ABNORMAL HIGH (ref 0.44–1.00)
GFR, Estimated: 5 mL/min — ABNORMAL LOW (ref >60)
Glucose, Bld: 216 mg/dL — ABNORMAL HIGH (ref 70–99)
Potassium: 3.2 mmol/L — ABNORMAL LOW (ref 3.5–5.1)
Sodium: 137 mmol/L (ref 135–145)
Total Bilirubin: 0.9 mg/dL (ref 0.0–1.2)
Total Protein: 6.5 g/dL (ref 6.5–8.1)

## 2023-05-29 LAB — BRAIN NATRIURETIC PEPTIDE: B Natriuretic Peptide: 190.6 pg/mL — ABNORMAL HIGH (ref 0.0–100.0)

## 2023-05-29 LAB — GLUCOSE, CAPILLARY
Glucose-Capillary: 151 mg/dL — ABNORMAL HIGH (ref 70–99)
Glucose-Capillary: 219 mg/dL — ABNORMAL HIGH (ref 70–99)

## 2023-05-29 LAB — MAGNESIUM: Magnesium: 2.3 mg/dL (ref 1.7–2.4)

## 2023-05-29 LAB — CBG MONITORING, ED: Glucose-Capillary: 155 mg/dL — ABNORMAL HIGH (ref 70–99)

## 2023-05-29 LAB — TROPONIN I (HIGH SENSITIVITY)
Troponin I (High Sensitivity): 10 ng/L (ref <18)
Troponin I (High Sensitivity): 10 ng/L (ref <18)

## 2023-05-29 MED ORDER — HYDROXYZINE HCL 25 MG PO TABS
25.0000 mg | ORAL_TABLET | Freq: Three times a day (TID) | ORAL | Status: DC | PRN
  Administered 2023-05-30 – 2023-06-07 (×18): 25 mg via ORAL
  Filled 2023-05-29 (×18): qty 1

## 2023-05-29 MED ORDER — FERRIC CITRATE 1 GM 210 MG(FE) PO TABS
210.0000 mg | ORAL_TABLET | Freq: Three times a day (TID) | ORAL | Status: DC
  Administered 2023-05-30 – 2023-06-13 (×28): 210 mg via ORAL
  Filled 2023-05-29 (×44): qty 1

## 2023-05-29 MED ORDER — ALLOPURINOL 100 MG PO TABS
100.0000 mg | ORAL_TABLET | Freq: Two times a day (BID) | ORAL | Status: DC
  Administered 2023-05-29 – 2023-06-20 (×40): 100 mg via ORAL
  Filled 2023-05-29 (×41): qty 1

## 2023-05-29 MED ORDER — MIRTAZAPINE 15 MG PO TABS
15.0000 mg | ORAL_TABLET | Freq: Every day | ORAL | Status: DC
  Administered 2023-05-29 – 2023-06-19 (×22): 15 mg via ORAL
  Filled 2023-05-29 (×22): qty 1

## 2023-05-29 MED ORDER — GERHARDT'S BUTT CREAM
TOPICAL_CREAM | Freq: Two times a day (BID) | CUTANEOUS | Status: DC
  Administered 2023-06-05 – 2023-06-18 (×2): 1 via TOPICAL
  Filled 2023-05-29 (×3): qty 60

## 2023-05-29 MED ORDER — INSULIN ASPART 100 UNIT/ML IJ SOLN
0.0000 [IU] | Freq: Three times a day (TID) | INTRAMUSCULAR | Status: DC
  Administered 2023-05-29 – 2023-06-11 (×16): 1 [IU] via SUBCUTANEOUS
  Administered 2023-06-17: 2 [IU] via SUBCUTANEOUS
  Administered 2023-06-18: 1 [IU] via SUBCUTANEOUS

## 2023-05-29 MED ORDER — PROSOURCE PLUS PO LIQD
30.0000 mL | Freq: Two times a day (BID) | ORAL | Status: DC
  Administered 2023-06-02 – 2023-06-20 (×6): 30 mL via ORAL
  Filled 2023-05-29 (×18): qty 30

## 2023-05-29 MED ORDER — RISAQUAD PO CAPS
1.0000 | ORAL_CAPSULE | Freq: Three times a day (TID) | ORAL | Status: DC
  Administered 2023-05-30 – 2023-06-20 (×50): 1 via ORAL
  Filled 2023-05-29 (×56): qty 1

## 2023-05-29 MED ORDER — BUSPIRONE HCL 5 MG PO TABS
5.0000 mg | ORAL_TABLET | Freq: Three times a day (TID) | ORAL | Status: DC
  Administered 2023-05-29 – 2023-06-20 (×57): 5 mg via ORAL
  Filled 2023-05-29 (×58): qty 1

## 2023-05-29 MED ORDER — METOPROLOL TARTRATE 25 MG PO TABS
25.0000 mg | ORAL_TABLET | Freq: Two times a day (BID) | ORAL | Status: DC
  Administered 2023-05-29 – 2023-06-04 (×11): 25 mg via ORAL
  Filled 2023-05-29 (×11): qty 1

## 2023-05-29 MED ORDER — FOLIC ACID 1 MG PO TABS
1.0000 mg | ORAL_TABLET | Freq: Every day | ORAL | Status: DC
  Administered 2023-05-29 – 2023-06-20 (×21): 1 mg via ORAL
  Filled 2023-05-29 (×22): qty 1

## 2023-05-29 MED ORDER — HYDROMORPHONE HCL 1 MG/ML IJ SOLN
1.0000 mg | Freq: Once | INTRAMUSCULAR | Status: AC
  Administered 2023-05-29: 1 mg via INTRAVENOUS
  Filled 2023-05-29: qty 1

## 2023-05-29 MED ORDER — FAMOTIDINE 20 MG PO TABS
20.0000 mg | ORAL_TABLET | Freq: Every day | ORAL | Status: DC
  Administered 2023-05-30 – 2023-06-20 (×21): 20 mg via ORAL
  Filled 2023-05-29 (×21): qty 1

## 2023-05-29 MED ORDER — MIDODRINE HCL 5 MG PO TABS
10.0000 mg | ORAL_TABLET | ORAL | Status: DC
  Administered 2023-05-31 – 2023-06-16 (×9): 10 mg via ORAL
  Filled 2023-05-29 (×12): qty 2

## 2023-05-29 MED ORDER — RISPERIDONE 1 MG PO TBDP
1.0000 mg | ORAL_TABLET | Freq: Every day | ORAL | Status: DC
  Administered 2023-05-29 – 2023-06-19 (×21): 1 mg via ORAL
  Filled 2023-05-29 (×24): qty 1

## 2023-05-29 MED ORDER — ATORVASTATIN CALCIUM 10 MG PO TABS
10.0000 mg | ORAL_TABLET | Freq: Every evening | ORAL | Status: DC
  Administered 2023-05-29 – 2023-06-20 (×20): 10 mg via ORAL
  Filled 2023-05-29 (×22): qty 1

## 2023-05-29 MED ORDER — SACCHAROMYCES BOULARDII 250 MG PO CAPS: 250.0000 mg | ORAL_CAPSULE | Freq: Two times a day (BID) | ORAL | Status: DC

## 2023-05-29 MED ORDER — ONDANSETRON HCL 4 MG/2ML IJ SOLN
4.0000 mg | Freq: Once | INTRAMUSCULAR | Status: AC
  Administered 2023-05-29: 4 mg via INTRAVENOUS
  Filled 2023-05-29: qty 2

## 2023-05-29 MED ORDER — POTASSIUM CHLORIDE CRYS ER 10 MEQ PO TBCR
10.0000 meq | EXTENDED_RELEASE_TABLET | Freq: Once | ORAL | Status: AC
  Administered 2023-05-29: 10 meq via ORAL
  Filled 2023-05-29: qty 1

## 2023-05-29 MED ORDER — HEPARIN SODIUM (PORCINE) 5000 UNIT/ML IJ SOLN
5000.0000 [IU] | Freq: Three times a day (TID) | INTRAMUSCULAR | Status: DC
  Administered 2023-05-29 – 2023-06-20 (×63): 5000 [IU] via SUBCUTANEOUS
  Filled 2023-05-29 (×64): qty 1

## 2023-05-29 MED ORDER — HYDROMORPHONE HCL 2 MG PO TABS: 2.0000 mg | ORAL_TABLET | Freq: Four times a day (QID) | ORAL | Status: DC | PRN

## 2023-05-29 MED ORDER — ONDANSETRON HCL 4 MG/2ML IJ SOLN
4.0000 mg | Freq: Four times a day (QID) | INTRAMUSCULAR | Status: DC | PRN
  Administered 2023-05-29 – 2023-06-20 (×30): 4 mg via INTRAVENOUS
  Filled 2023-05-29 (×31): qty 2

## 2023-05-29 MED ORDER — HYDROMORPHONE HCL 1 MG/ML IJ SOLN
0.5000 mg | Freq: Once | INTRAMUSCULAR | Status: AC
  Administered 2023-05-29: 0.5 mg via INTRAVENOUS
  Filled 2023-05-29: qty 1

## 2023-05-29 MED ORDER — HYDROMORPHONE HCL 1 MG/ML IJ SOLN
1.0000 mg | INTRAMUSCULAR | Status: DC | PRN
  Administered 2023-05-29 – 2023-06-02 (×26): 1 mg via INTRAVENOUS
  Filled 2023-05-29 (×27): qty 1

## 2023-05-29 MED ORDER — PANTOPRAZOLE SODIUM 40 MG IV SOLR
40.0000 mg | Freq: Two times a day (BID) | INTRAVENOUS | Status: DC
  Administered 2023-05-29 – 2023-05-30 (×3): 40 mg via INTRAVENOUS
  Filled 2023-05-29 (×3): qty 10

## 2023-05-29 NOTE — Assessment & Plan Note (Signed)
-  Wound consult

## 2023-05-29 NOTE — H&P (Addendum)
 History and Physical    Patient: Deborah [name]:[id number] DOB: 1966-01-10 DOA: 05/29/2023 DOS: the patient was seen and examined on 05/29/2023 PCP: Pcp, No  Patient coming from: [facility]. WC, has right BKA.    Chief Complaint: chest pain/abdominal pain and diarrhea x 13 days in setting of C-Diff.   HPI: Deborah [name] is a 58 y.o. female with medical history significant of  ESRD on dialysis, gastroparesis, GERD, ACD, PAF not AC, diastolic CHF with preserved EF 55 to 60%, gout, duodenal carcinoid tumor, PVD s/p right-sided BKA, history of gastric AVM, NASH, reactive dyskinesia, depression, anxiety, obesity, restrictive lung disease, spinal stenosis, chronic pain syndrome,T2DM, chronic left heel diabetic ulcer, and history of chronic diarrhea in the setting of carcinoid tumor who presented to Ed with complaints of diarrhea in setting of C.Diff and abdominal pain.   She states this morning she woke up and had chest pain and abdominal pain that prompted her to come to ED. She has not seen any blood in her stool recently. She states the pain is 10/10 and has to have IV dilaudid. She is on oral dilaudid at home. She has had poor PO intake due to her nausea. She states pain in her stomach 10/10 and is all across and radiates up into her chest. Pain seemed to made worse with food at times. No palpitations,   Missed dialysis Thursday and Saturday. Last session on Tuesday. She does have leg swelling. She endorses shortness of breath, no coughing. Still makes urine.   Denies any fever/chills, vision changes/headaches, palpitations, cough, dysuria  She does not smoke or drink alcohol.   ER Course:  vitals: afebrile, bp: 134/65, HR: 95, RR: 19, oxygen: 100% on RA Pertinent labs: wbc: 10.8, hgb: 9.5, potassium: 3.2, BUN: 35, creatinine: 8.22,  CXR: increased cardiomegaly and mild central vascular prominence without overt edema.  CT abdomen/pelvis: no acute finding. Fibroid uterus.  Calcified femoral artery atherosclerosis In ED: c.diff pending, TRH asked to admit.    Review of Systems: As mentioned in the history of present illness. All other systems reviewed and are negative. Past Medical History:  Diagnosis Date   Acute back pain with sciatica, left    Acute back pain with sciatica, right    Acute encephalopathy 05/29/2022   Acute osteomyelitis of right calcaneus (HCC) 12/21/2022   Anemia, unspecified    Atrial fibrillation with RVR (HCC)-resolved 09/09/2022   Atypical chest pain 09/10/2021   Cancer (HCC)    Carcinoid tumor of duodenum    Chest pain with normal coronary angiography 2019   Chronic a-fib (HCC) 09/09/2022   Chronic pain    Chronic systolic CHF (congestive heart failure) (HCC)    Dehiscence of amputation stump of right lower extremity (HCC) 01/29/2023   Diabetes mellitus    DKA (diabetic ketoacidosis) (HCC)    Drug-seeking behavior    21 hospitalizations and 14 CT a/p in 2 years for N/V and abdominal pain, demanding only IV dilaudid   Elevated troponin    chronic   Esophageal reflux    Fibromyalgia    Gastric ulcer    Gastroparesis    Gout    HCAP (healthcare-associated pneumonia) 06/19/2022   Hyperlipidemia    Hyperosmolar hyperglycemic state (HHS) (HCC) 05/11/2022   Hyperosmolar non-ketotic state due to type 2 diabetes mellitus (HCC) 05/11/2022   Hypertension    Hypomagnesemia    Lumbosacral stenosis    LVH (left ventricular hypertrophy)    Morbid obesity (HCC)    Nausea &  vomiting 09/09/2022   NICM (nonischemic cardiomyopathy) (HCC)    PAF (paroxysmal atrial fibrillation) (HCC)    Sepsis (HCC) 11/23/2022   Stroke (HCC) 02/2011   Symptomatic anemia 12/14/2022   Thrombocytosis    Vitamin B12 deficiency anemia    Past Surgical History:  Procedure Laterality Date   ABDOMINAL AORTOGRAM W/LOWER EXTREMITY N/A 11/29/2022   Procedure: ABDOMINAL AORTOGRAM W/LOWER EXTREMITY;  Surgeon: Daria Pastures, MD;  Location: [facility] INVASIVE CV  LAB;  Service: Cardiovascular;  Laterality: N/A;   AMPUTATION Right 12/29/2022   Procedure: RIGHT BELOW KNEE AMPUTATION;  Surgeon: Nadara Mustard, MD;  Location: [facility] OR;  Service: Orthopedics;  Laterality: Right;   AV FISTULA PLACEMENT Left 06/30/2022   Procedure: LEFT BRACHIOCEPHALIC ARTERIOVENOUS (AV) FISTULA CREATION;  Surgeon: Cephus Shelling, MD;  Location: [facility] OR;  Service: Vascular;  Laterality: Left;   BIOPSY  07/27/2019   Procedure: BIOPSY;  Surgeon: Vida Rigger, MD;  Location: [facility] ENDOSCOPY;  Service: Endoscopy;;   BIOPSY  07/30/2019   Procedure: BIOPSY;  Surgeon: Kathi Der, MD;  Location: [facility] ENDOSCOPY;  Service: Gastroenterology;;   BIOPSY  04/28/2023   Procedure: BIOPSY;  Surgeon: Imogene Burn, MD;  Location: [facility] ENDOSCOPY;  Service: Gastroenterology;;   CATARACT EXTRACTION  01/2014   CHOLECYSTECTOMY     COLONOSCOPY WITH PROPOFOL N/A 07/30/2019   Procedure: COLONOSCOPY WITH PROPOFOL;  Surgeon: Kathi Der, MD;  Location: [facility] ENDOSCOPY;  Service: Gastroenterology;  Laterality: N/A;   ESOPHAGOGASTRODUODENOSCOPY N/A 07/27/2019   Procedure: ESOPHAGOGASTRODUODENOSCOPY (EGD);  Surgeon: Vida Rigger, MD;  Location: [facility] ENDOSCOPY;  Service: Endoscopy;  Laterality: N/A;   ESOPHAGOGASTRODUODENOSCOPY N/A 07/26/2020   Procedure: ESOPHAGOGASTRODUODENOSCOPY (EGD);  Surgeon: Willis Modena, MD;  Location: [facility] ENDOSCOPY;  Service: Endoscopy;  Laterality: N/A;   ESOPHAGOGASTRODUODENOSCOPY (EGD) WITH PROPOFOL N/A 08/02/2019   Procedure: ESOPHAGOGASTRODUODENOSCOPY (EGD) WITH PROPOFOL;  Surgeon: Kathi Der, MD;  Location: [facility] ENDOSCOPY;  Service: Gastroenterology;  Laterality: N/A;   ESOPHAGOGASTRODUODENOSCOPY (EGD) WITH PROPOFOL N/A 12/23/2022   Procedure: ESOPHAGOGASTRODUODENOSCOPY (EGD) WITH PROPOFOL;  Surgeon: Shellia Cleverly, DO;  Location: [facility] ENDOSCOPY;  Service: Gastroenterology;  Laterality: N/A;   ESOPHAGOGASTRODUODENOSCOPY (EGD) WITH PROPOFOL N/A 04/28/2023   Procedure:  ESOPHAGOGASTRODUODENOSCOPY (EGD) WITH PROPOFOL;  Surgeon: Imogene Burn, MD;  Location: [facility] ENDOSCOPY;  Service: Gastroenterology;  Laterality: N/A;   FISTULA SUPERFICIALIZATION Left 12/31/2022   Procedure: LEFT ARM FISTULA TRANSPOSITION;  Surgeon: Maeola Harman, MD;  Location: [facility] OR;  Service: Vascular;  Laterality: Left;   GIVENS CAPSULE STUDY N/A 12/23/2022   Procedure: GIVENS CAPSULE STUDY;  Surgeon: Shellia Cleverly, DO;  Location: [facility] ENDOSCOPY;  Service: Gastroenterology;  Laterality: N/A;   HEMOSTASIS CLIP PLACEMENT  08/02/2019   Procedure: HEMOSTASIS CLIP PLACEMENT;  Surgeon: Kathi Der, MD;  Location: [facility] ENDOSCOPY;  Service: Gastroenterology;;   HOT HEMOSTASIS N/A 12/23/2022   Procedure: HOT HEMOSTASIS (ARGON PLASMA COAGULATION/BICAP);  Surgeon: Shellia Cleverly, DO;  Location: [facility] ENDOSCOPY;  Service: Gastroenterology;  Laterality: N/A;   IR FLUORO GUIDE CV LINE RIGHT  06/24/2022   IR US GUIDE VASC ACCESS RIGHT  06/24/2022   POLYPECTOMY  07/30/2019   Procedure: POLYPECTOMY;  Surgeon: Kathi Der, MD;  Location: [facility] ENDOSCOPY;  Service: Gastroenterology;;   POLYPECTOMY  08/02/2019   Procedure: POLYPECTOMY;  Surgeon: Kathi Der, MD;  Location: [facility] ENDOSCOPY;  Service: Gastroenterology;;   Gaspar Bidding DILATION N/A 04/28/2023   Procedure: Gaspar Bidding DILATION;  Surgeon: Imogene Burn, MD;  Location: [facility] ENDOSCOPY;  Service: Gastroenterology;  Laterality: N/A;   STUMP REVISION Right 02/02/2023  Procedure: REVISION RIGHT BELOW KNEE AMPUTATION;  Surgeon: Nadara Mustard, MD;  Location: [facility] OR;  Service: Orthopedics;  Laterality: Right;   Social History:  reports that she has never smoked. She has never used smokeless tobacco. She reports that she does not drink alcohol and does not use drugs.  Allergies  Allergen Reactions   Gabapentin Hives and [name]   Isovue [Iopamidol] Anaphylaxis, [name] and Other (See Comments)    11/28/17 Patient had  seizure like activity and then 1 min code after 100 cc of isovue 300. Possible contrast allergy vs vasovagal episode  Cardiac Arrest   Nsaids Anaphylaxis and Other (See Comments)    Hx of stomach ulcers   Penicillins Itching, Palpitations and Other (See Comments)    Flushing (Red Skin) Laryngeal Edema   Reglan [Metoclopramide] Other (See Comments)    Tardive dyskinesia    Valium [Diazepam] [name]   Zestril [Lisinopril] Anaphylaxis and Swelling    Tongue and mouth swelling Laryngeal Edema   Tolectin [Tolmetin] Nausea And Vomiting, Nausea Only and Other (See Comments)    Irritates stomach ulcer   Asa [Aspirin] Other (See Comments)    Hx of stomach ulcer   Aspartame And Phenylalanine Hives   Bentyl [Dicyclomine] Other (See Comments)    Chest pain   Hibiclens [Chlorhexidine Gluconate] Other (See Comments)    Dermatitis    Flexeril [Cyclobenzaprine] Palpitations   Oxycontin [Oxycodone] Palpitations   Rifamycins Palpitations   Tylenol [Acetaminophen] Nausea And Vomiting, Nausea Only and Other (See Comments)    Irritates stomach ulcer Abdominal pain   Ultram [Tramadol] Nausea And Vomiting and Palpitations    Family History  Problem Relation Age of Onset   Diabetes Mother    Diabetes Father    Heart disease Father    Diabetes Sister    Congestive Heart Failure Sister 66   Diabetes Brother     Prior to Admission medications   Medication Sig Start Date End Date Taking? Authorizing Provider  albuterol (PROVENTIL) (2.5 MG/3ML) 0.083% nebulizer solution Take 3 mLs (2.5 mg total) by nebulization every 6 (six) hours as needed for wheezing or shortness of breath. 04/06/19   Bing Neighbors, NP  allopurinol (ZYLOPRIM) 100 MG tablet Take 1 tablet (100 mg total) by mouth 2 (two) times daily. 11/30/22   Azucena Fallen, MD  ascorbic acid (VITAMIN C) 500 MG tablet Take 500 mg by mouth daily.    [provider]  atorvastatin (LIPITOR) 10 MG tablet Take 10 mg by  mouth every evening. 12/03/21   [provider]  busPIRone (BUSPAR) 5 MG tablet Take 1 tablet (5 mg total) by mouth 3 (three) times daily. 05/04/23   Rodolph Bong, MD  cetirizine (ZYRTEC) 10 MG tablet Take 10 mg by mouth daily. 08/05/22   [provider]  famotidine (PEPCID) 20 MG tablet Take 20 mg by mouth daily before breakfast.    [provider]  ferric citrate (AURYXIA) 1 GM 210 MG(Fe) tablet Take 2 tablets (420 mg total) by mouth 3 (three) times daily with meals. Patient taking differently: Take 210 mg by mouth 3 (three) times daily with meals. 11/30/22   Azucena Fallen, MD  fluticasone [facility]) 50 MCG/ACT nasal spray Place 2 sprays into both nostrils daily as needed for allergies or rhinitis. 12/19/18   Rai, Delene Ruffini, MD  folic acid (FOLVITE) 1 MG tablet Take 1 tablet (1 mg total) by mouth daily. 01/10/23   [name]  P, MD  HYDROmorphone (DILAUDID) 2 MG tablet Take 1 tablet (2 mg total) by mouth every 6 (six) hours as needed for severe pain (pain score 7-10) or moderate pain (pain score 4-6). 05/04/23   Rodolph Bong, MD  hydrOXYzine (ATARAX) 25 MG tablet Take 1 tablet (25 mg total) by mouth 3 (three) times daily as needed for anxiety. Patient taking differently: Take 25 mg by mouth See admin instructions. Give 25 mg (1 tablet) every 6 hours as needed for anxiety, agitation for 14 days (starting 11/19), then every 8 hours as needed. 01/10/23   Danford, Earl Lites, MD  insulin lispro (HUMALOG) 100 UNIT/ML KwikPen Before each meal 3 times a day, 140-199 - 2 units, 200-250 - 6 units, 251-299 - 8 units,  300-349 - 12 units,  350 or above 14 units. Patient taking differently: Inject 0-14 Units into the skin See admin instructions. Inject 0-14 units per sliding scale before meals: < 70 notify MD 70-139 : 0 units 140-199 : 2 units 200-250 : 6 units 251-299 : 8 units 300-349 : 12 units 350-400 : 14 units > 401 notify MD. 06/04/22   Leroy Sea, MD  Lactobacillus (ACIDOPHILUS) CAPS capsule Take 1 capsule by mouth 3 (three) times daily with meals. 03/08/23   Elgergawy, Leana Roe, MD  leptospermum manuka honey (MEDIHONEY) PSTE paste Apply 1 Application topically daily. 03/08/23   Elgergawy, Leana Roe, MD  loperamide (IMODIUM) 2 MG capsule Take 1 capsule (2 mg total) by mouth as needed for diarrhea or loose stools. 03/08/23   Elgergawy, Leana Roe, MD  methocarbamol (ROBAXIN) 500 MG tablet Take 1 tablet (500 mg total) by mouth every 6 (six) hours as needed for muscle spasms. 01/10/23   Danford, Earl Lites, MD  metoprolol tartrate (LOPRESSOR) 25 MG tablet Take 1 tablet (25 mg total) by mouth 2 (two) times daily. 03/08/23   Elgergawy, Leana Roe, MD  midodrine (PROAMATINE) 10 MG tablet Take 1 tablet (10 mg total) by mouth 3 (three) times daily. 05/04/23   Pola Corn, NP  midodrine (PROAMATINE) 10 MG tablet Take 1 tablet (10 mg total) by mouth Every Tuesday,Thursday,and Saturday with dialysis. 05/05/23   Pola Corn, NP  mirtazapine (REMERON) 15 MG tablet Take 15 mg by mouth at bedtime. 10/18/22   [provider]  Multiple Vitamins-Minerals (SENIOR MULTIVITAMIN PLUS PO) Take 1 tablet by mouth daily.    [provider]  nutrition supplement, JUVEN, (JUVEN) PACK Take 1 packet by mouth 2 (two) times daily between meals. 01/10/23   Danford, Earl Lites, MD  Nutritional Supplements (,FEEDING SUPPLEMENT, PROSOURCE PLUS) liquid Take 30 mLs by mouth 2 (two) times daily between meals. 01/10/23   Danford, Earl Lites, MD  ondansetron (ZOFRAN-ODT) 4 MG disintegrating tablet Take 1 tablet (4 mg total) by mouth every 8 (eight) hours as needed for nausea or vomiting. 05/04/23   Rodolph Bong, MD  pantoprazole (PROTONIX) 40 MG tablet Take 1 tablet (40 mg total) by mouth 2 (two) times daily. 02/18/23   Raulkar, Drema Pry, MD  risperiDONE (RISPERDAL M-TABS) 1 MG disintegrating tablet Take 1 tablet (1 mg total) by mouth  at bedtime. 05/04/23   Rodolph Bong, MD    Physical Exam: Vitals:   05/29/23 1330 05/29/23 1345 05/29/23 1414 05/29/23 1451  BP:  131/71 136/74 117/84  Pulse:  89 98 99  Resp:  16 17 18   Temp: 97.8 F (36.6 C)   (P) 98 F (36.7 C)  TempSrc: Oral   (  P) Oral  SpO2:  100% 100% 100%  Weight:    (P) 104.8 kg  Height:    (P) 5\' 6"  (1.676 m)   General:  Appears calm and comfortable and is crying intermittently in pain. Obese  Eyes:  PERRL, EOMI, normal lids, iris ENT:  grossly normal hearing, lips & tongue, mmm; appropriate dentition Neck:  no LAD, masses or thyromegaly; no carotid bruits Cardiovascular:  RRR, no m/r/g. +LE edema of LLL.  Respiratory:   CTA bilaterally with no wheezes/rales/rhonchi.  Normal respiratory effort. Abdomen:  soft, TTP generalized, but at times distractible. BS+  Back:   normal alignment, no CVAT Skin:  no rash or induration seen on limited exam. TDC in upper right anterior chest wall  Musculoskeletal:  grossly normal tone BUE/LLE, good ROM, no bony abnormality. Right BKA  Lower extremity:    chronic ulcer of left heel Psychiatric:  grossly normal mood and affect, speech fluent and appropriate, AOx3 Neurologic:  CN 2-12 grossly intact, moves all extremities in coordinated fashion, sensation intact   Radiological Exams on Admission: Independently reviewed - see discussion in A/P where applicable  CT ABDOMEN PELVIS WO CONTRAST Result Date: 05/29/2023 CLINICAL DATA:  58 year old female with acute abdominal pain. EXAM: CT ABDOMEN AND PELVIS WITHOUT CONTRAST TECHNIQUE: Multidetector CT imaging of the abdomen and pelvis was performed following the standard protocol without IV contrast. RADIATION DOSE REDUCTION: This exam was performed according to the departmental dose-optimization program which includes automated exposure control, adjustment of the mA and/or kV according to patient size and/or use of iterative reconstruction technique. COMPARISON:  CT Chest,  Abdomen, and Pelvis 04/25/2023. FINDINGS: Lower chest: Heart size remains normal. Negative lung bases. No pericardial or pleural effusion. Hepatobiliary: Chronic cholecystectomy.  Negative noncontrast liver. Pancreas: Negative. Spleen: Negative. Adrenals/Urinary Tract: Normal adrenal glands. Stable noncontrast kidneys are nonobstructed. Diminutive ureters. No nephrolithiasis. Diminutive bladder. Stable pelvic phleboliths. Stomach/Bowel: Mild to moderate retained stool only in the rectosigmoid colon. Decompressed upstream large bowel. Diminutive and normal appendix on series 3, image 48. No dilated small bowel. Retained food in the stomach. Negative duodenum. No free air or free fluid. Vascular/Lymphatic: Mild Calcified aortic atherosclerosis. Vascular patency is not evaluated in the absence of IV contrast. Calcified proximal femoral artery atherosclerosis. No lymphadenopathy. Reproductive: Exophytic, subserosal and pedunculated left uterine fundal fibroid is 5 cm diameter, stable, partially calcified. Smaller adjacent left cornu fibroid. Negative noncontrast ovaries. Other: No pelvis free fluid. Musculoskeletal: No acute osseous abnormality identified. Chronic degeneration in the spine including bulky facet and endplate spurring at the lumbosacral junction. IMPRESSION: 1. No acute or inflammatory process in the noncontrast abdomen or pelvis. 2. Fibroid Uterus.  Calcified Femoral artery atherosclerosis. Electronically Signed   By: Odessa Fleming M.D.   On: 05/29/2023 06:40   DG Chest Port 1 View Result Date: 05/29/2023 CLINICAL DATA:  Chest pains. EXAM: PORTABLE CHEST 1 VIEW COMPARISON:  AP and lateral chest 04/25/2023 FINDINGS: Right IJ dialysis catheter again terminates at the superior cavoatrial junction. There is increased mild cardiomegaly, mild central vascular prominence without overt edema. The lungs are clear. The sulci are sharp. The mediastinum is normally outlined. No new osseous findings. Degenerative change  and mild dextroscoliosis thoracic spine. Bilateral shoulder DJD. IMPRESSION: 1. Increased mild cardiomegaly and mild central vascular prominence without overt edema. 2. No other evidence of acute chest process. Electronically Signed   By: Almira Bar M.D.   On: 05/29/2023 03:57    EKG: Independently reviewed.  NSR with rate 97; nonspecific ST  changes with no evidence of acute ischemia. LAFB similar to previous    Labs on Admission: I have personally reviewed the available labs and imaging studies at the time of the admission.  Pertinent labs:   wbc: 10.8,  hgb: 9.5,  potassium: 3.2,  BUN: 35,  creatinine: 8.22,  Assessment and Plan: Principal Problem:   Intractable nausea Active Problems:   Anemia of chronic disease   Chronic diarrhea with history of C.Diff   Acute on chronic diastolic CHF (congestive heart failure) (HCC)   Chronic hypokalemia   Chronic pain syndrome/chronic abdominal pain   ESRD on dialysis (HCC)   Insulin dependent type 2 diabetes mellitus (HCC)   Paroxysmal atrial fibrillation (HCC)   Malignant carcinoid tumor of duodenum (HCC)   GERD (gastroesophageal reflux disease)   Chronic depression/anxiety   Chronic gout   Chronic wound of left heel    Assessment and Plan: * Intractable nausea 58 year old with history of chronic abdominal pain and diarrhea who presents to ED with complaints of worsening diarrhea (tx'd for c.diff at her SNF), acute on chronic abdominal pain and intractable nausea that is preventing her from eating  -obs to tele  -CT abdomen/pelvis with no acute finding  -change her oral dilaudid to IV  -IV protonix  -IV zofran  -stool studies/C.Diff negative  -hx of gastroparesis, allergy to reglan>TD  -she has missed dialysis x2 session and is volume overloaded and could be contributing to her nausea, HD per nephrology  -check UDS  -advance diet as tolerated   Anemia of chronic disease Baseline hgb 8-9 Stable, continue to  monitor  Chronic diarrhea with history of C.Diff Apparently diagnosed with C.Diff at her SNF and treated. Completed course of vanc and flagyl Repeat C.Diff was ordered in ED, but she has had no diarrhea x 7+ hours in ED  She states this is different than her normal BM She states she is having 9 episodes/day  C.Diff here is negative today   Acute on chronic diastolic CHF (congestive heart failure) (HCC) Volume overloaded as missed her dialysis x2 Nephrology consulted Volume control per HD   Chronic hypokalemia Likely secondary to diarrhea Magnesium wnl Give x1 Trend   Chronic pain syndrome/chronic abdominal pain States her abdominal pain is worse than her chronic pain.  Crying in room and asking for IV dilaudid Will change her dilaudid to IV for now and hopefully we can change back to her home oral dose tomorrow when tolerating PO  Continue home Robaxin as needed and gabapentin   ESRD on dialysis [facility]) Last dialysis session was Tuesday March 18th  Volume overloaded Nephrology consulted and appreciate assistance   Insulin dependent type 2 diabetes mellitus (HCC) A1C of 5.7 in 01/2023 Very sensitive SSI and accuchecks QAC/HS   Paroxysmal atrial fibrillation (HCC) Continue metoprolol. No longer on Eliquis due to GI bleeding in 2024 October.   Malignant carcinoid tumor of duodenum (HCC) S/p excision   GERD (gastroesophageal reflux disease) Continue PPI BID>change to IV while not tolerating PO   Chronic depression/anxiety Continue remeron, buspar and hydroxyzine   Chronic gout -Continue allopurinol 100 mg twice daily.   Chronic wound of left heel Wound consult     Advance Care Planning:   Code Status: Full Code   Consults: nephrology   DVT Prophylaxis: heparin   Family Communication: none   Severity of Illness: The appropriate patient status for this patient is OBSERVATION. Observation status is judged to be reasonable and necessary in order to  provide  the required intensity of service to ensure the patient's safety. The patient's presenting symptoms, physical exam findings, and initial radiographic and laboratory data in the context of their medical condition is felt to place them at decreased risk for further clinical deterioration. Furthermore, it is anticipated that the patient will be medically stable for discharge from the hospital within 2 midnights of admission.   Author: Orland Mustard, MD 05/29/2023 6:31 PM  For on call review [email or web address].

## 2023-05-29 NOTE — Assessment & Plan Note (Addendum)
 C diff has resolved.

## 2023-05-29 NOTE — Assessment & Plan Note (Addendum)
 Continue antiacid therapy. As needed antiemetics.

## 2023-05-29 NOTE — Assessment & Plan Note (Addendum)
 Continue metoprolol for rate control.  Patient is off anticoagulation with to  history of GI bleeding in 10/24   Off telemetry

## 2023-05-29 NOTE — ED Notes (Signed)
 CCMD notified. Pt is on monitor.

## 2023-05-29 NOTE — Assessment & Plan Note (Addendum)
 Continue remeron, buspar and hydroxyzine  Chronic pain syndrome.  Patient with uncontrolled disease, will consult psychiatry for further recommendations.

## 2023-05-29 NOTE — Assessment & Plan Note (Addendum)
 Hyponatremia. Hyperphosphatemia.   She has peripheral edema.  She has been not compliant with sodium restricted diet.  Continue inpatient renal replacement therapy per nephrology recommendations.  She has left upper extremity fistula with appropriate functioning.   Scheduled for HD per [facility], schedule    Anemia of chronic renal disease. Continue with EPO.

## 2023-05-29 NOTE — Assessment & Plan Note (Addendum)
 Continue glucose cover and monitoring with insulin sliding scale  Patient is having better toleration to po.

## 2023-05-29 NOTE — Plan of Care (Signed)

## 2023-05-29 NOTE — Assessment & Plan Note (Deleted)
 Likely secondary to diarrhea Magnesium wnl Give x1 Trend

## 2023-05-29 NOTE — Assessment & Plan Note (Addendum)
 Volume overload in the setting of ESRD. Patient not compliant with HD as outpatient.  Plan to continue ultrafiltration on HD.   Continue blood pressure support with midodrine. Continue metoprolol.

## 2023-05-29 NOTE — Assessment & Plan Note (Addendum)
 Left shoulder pain has improved with topical diclofenac, continue with muscle relaxants and analgesics.   Encourage mobility.  Continue IV hydromorphone to every 5 hrs as needed.  Will increase frequency of hydroxyzine and will change methocarbamol to scheduled tid.    Patient having depression and anxiety symptoms that likely are exacerbating her pain, she is in agreement in psychiatry consultation. Add trazodone for pain control.

## 2023-05-29 NOTE — Assessment & Plan Note (Addendum)
 Gastrointestinal symptoms are improving, patient is tolerating po.  Continue with pre meal phenergan po .   Plan to continue supportive medical therapy with as needed antiemetics.  Scheduled antiacids.

## 2023-05-29 NOTE — ED Triage Notes (Signed)
 Pt bib [facility] coming from [facility] w/ c/o chest pain (one hour ago onset), abdominal pain, diarrhea x13d. Facility reports to EMS that pt has c-diff. Missed dialysis Thurs-Sat schedule. Pt on 3L supplemental O2. Pt is allergic to aspirin. GCS 15.   EMS vs: 12 Lead unremarkable  116/82 80s HR normal sinus 99% on RA  160 cbg

## 2023-05-29 NOTE — Assessment & Plan Note (Addendum)
 S/p excision  Patient has intermittent diarrhea, continue lomotil as needed.

## 2023-05-29 NOTE — ED Provider Notes (Signed)
 [facility] Emergency Department Provider Note MRN:  [id number]  Arrival date & time: 05/29/23     Chief Complaint   Chest Pain and Abdominal Pain   History of Present Illness   Deborah [name] is a 58 y.o. year-old female presents to the ED with chief complaint of abdominal pain and chest pain.  Staying and [place].  Has had C. Diff for the past 2-3 weeks.  She states that she quit taking antibiotics for it.  She states that she missed her last 2 dialysis sessions because of frequency of diarrhea.  She reports having some associated CP, but denies SOB.    History provided by patient.   Review of Systems  Pertinent positive and negative review of systems noted in HPI.    Physical Exam   Vitals:   05/29/23 1451 05/29/23 2040  BP: 117/84 93/70  Pulse: 99 (!) 110  Resp: 18 20  Temp: (P) 98 F (36.7 C) 98.1 F (36.7 C)  SpO2: 100% 99%    CONSTITUTIONAL:  chronically ill-appearing, NAD NEURO:  Alert and oriented x 3, CN 3-12 grossly intact EYES:  eyes equal and reactive ENT/NECK:  Supple, no stridor  CARDIO:  normal rate, regular rhythm, appears well-perfused  PULM:  No respiratory distress, CTAB GI/GU:  non-distended,  MSK/SPINE:  No gross deformities, no edema, moves all extremities  SKIN:  no rash, atraumatic   *Additional and/or pertinent findings included in MDM below  Diagnostic and Interventional Summary    EKG Interpretation Date/Time:  Sunday May 29 2023 02:38:38 EDT Ventricular Rate:  97 PR Interval:  205 QRS Duration:  92 QT Interval:  385 QTC Calculation: 490 R Axis:   -51  Text Interpretation: Sinus rhythm Prolonged PR interval Left anterior fascicular block Low voltage, precordial leads No significant change since last tracing Confirmed by Zadie Rhine ([id number]) on 05/29/2023 2:47:13 AM       Labs Reviewed  COMPREHENSIVE METABOLIC PANEL - Abnormal; Notable for the following components:      Result Value    Potassium 3.2 (*)    CO2 17 (*)    Glucose, Bld 216 (*)    BUN 35 (*)    Creatinine, Ser 8.22 (*)    Calcium 8.3 (*)    Albumin 2.2 (*)    AST 14 (*)    GFR, Estimated 5 (*)    All other components within normal limits  CBC WITH DIFFERENTIAL/PLATELET - Abnormal; Notable for the following components:   WBC 10.8 (*)    RBC 3.21 (*)    Hemoglobin 9.5 (*)    HCT 32.1 (*)    MCHC 29.6 (*)    RDW 15.9 (*)    All other components within normal limits  BRAIN NATRIURETIC PEPTIDE - Abnormal; Notable for the following components:   B Natriuretic Peptide 190.6 (*)    All other components within normal limits  GLUCOSE, CAPILLARY - Abnormal; Notable for the following components:   Glucose-Capillary 151 (*)    All other components within normal limits  GLUCOSE, CAPILLARY - Abnormal; Notable for the following components:   Glucose-Capillary 219 (*)    All other components within normal limits  CBG MONITORING, ED - Abnormal; Notable for the following components:   Glucose-Capillary 155 (*)    All other components within normal limits  C DIFFICILE QUICK SCREEN W PCR REFLEX    GASTROINTESTINAL PANEL BY PCR, STOOL (REPLACES STOOL CULTURE)  LIPASE, BLOOD  MAGNESIUM  HEPATITIS B SURFACE ANTIGEN  HEPATITIS B SURFACE ANTIBODY, QUANTITATIVE  BASIC METABOLIC PANEL  CBC  RAPID URINE DRUG SCREEN, HOSP PERFORMED  TROPONIN I (HIGH SENSITIVITY)  TROPONIN I (HIGH SENSITIVITY)    CT ABDOMEN PELVIS WO CONTRAST  Final Result    DG Chest Port 1 View  Final Result      Medications  insulin aspart (novoLOG) injection 0-6 Units (1 Units Subcutaneous Given 05/29/23 1752)  ondansetron (ZOFRAN) injection 4 mg (4 mg Intravenous Given 05/29/23 1153)  HYDROmorphone (DILAUDID) injection 1 mg (1 mg Intravenous Given 05/29/23 1958)  pantoprazole (PROTONIX) injection 40 mg (40 mg Intravenous Given 05/29/23 2120)  heparin injection 5,000 Units (5,000 Units Subcutaneous Given 05/29/23 2121)  allopurinol (ZYLOPRIM)  tablet 100 mg (100 mg Oral Given 05/29/23 2119)  atorvastatin (LIPITOR) tablet 10 mg (10 mg Oral Given 05/29/23 1731)  metoprolol tartrate (LOPRESSOR) tablet 25 mg (25 mg Oral Given 05/29/23 2119)  midodrine (PROAMATINE) tablet 10 mg (has no administration in time range)  busPIRone (BUSPAR) tablet 5 mg (5 mg Oral Given 05/29/23 2119)  hydrOXYzine (ATARAX) tablet 25 mg (has no administration in time range)  mirtazapine (REMERON) tablet 15 mg (15 mg Oral Given 05/29/23 2119)  risperiDONE (RISPERDAL M-TABS) disintegrating tablet 1 mg (1 mg Oral Given 05/29/23 2119)  famotidine (PEPCID) tablet 20 mg (has no administration in time range)  ferric citrate (AURYXIA) tablet 210 mg (has no administration in time range)  acidophilus (RISAQUAD) capsule 1 capsule (has no administration in time range)  folic acid (FOLVITE) tablet 1 mg (1 mg Oral Given 05/29/23 1731)  (feeding supplement) PROSource Plus liquid 30 mL (has no administration in time range)  Gerhardt's butt cream ( Topical Given 05/29/23 2119)  ondansetron (ZOFRAN) injection 4 mg (4 mg Intravenous Given 05/29/23 0403)  HYDROmorphone (DILAUDID) injection 1 mg (1 mg Intravenous Given 05/29/23 0358)  HYDROmorphone (DILAUDID) injection 0.5 mg (0.5 mg Intravenous Given 05/29/23 0651)  potassium chloride (KLOR-CON M) CR tablet 10 mEq (10 mEq Oral Given 05/29/23 1945)     Procedures  /  Critical Care Procedures  ED Course and Medical Decision Making  I have reviewed the triage vital signs, the nursing notes, and pertinent available records from the EMR.  Social Determinants Affecting Complexity of Care: Patient has no clinically significant social determinants affecting this chief complaint..   ED Course:    Medical Decision Making Patient here with SOB, diarrhea, and missed dialysis.    Labs and imaging pending.  Amount and/or Complexity of Data Reviewed Labs: ordered. Radiology: ordered.  Risk Prescription drug management. Decision regarding  hospitalization.         Consultants:    Treatment and Plan: Patient signed out to oncoming team.    Final Clinical Impressions(s) / ED Diagnoses     ICD-10-CM   1. Congestive heart failure, unspecified HF chronicity, unspecified heart failure type [facility])  I50.9       ED Discharge Orders     None         Discharge Instructions Discussed with and Provided to Patient:   Discharge Instructions   None      Roxy Horseman, PA-C 05/29/23 2253    Zadie Rhine, MD 05/29/23 [phone]

## 2023-05-29 NOTE — ED Provider Notes (Addendum)
 Not received on this 58 year old female.  Presents with concern of diarrhea for the past 2 weeks.  States she was COVID-positive and has received treatment.  See previous note for full details.  Pending CT scan at the time of shift change. Physical Exam  BP 104/60   Pulse 88   Temp 97.7 F (36.5 C) (Oral)   Resp 17   Ht 5\' 6"  (1.676 m)   Wt 96.2 kg   LMP 10/10/2012   SpO2 100%   BMI 34.22 kg/m     Procedures  Procedures  ED Course / MDM    Medical Decision Making Amount and/or Complexity of Data Reviewed Labs: ordered. Radiology: ordered.  Risk Prescription drug management. Decision regarding hospitalization.   CT scan without acute concerns. Patient states she would like admission for her ongoing diarrhea, difficulty tolerating p.o. intake. Labs did not indicate urgent need for dialysis. Discussed with hospitalist.  They will evaluate patient for admission. Discussed with nephrologist. They will evaluate patient as well.      Marita Kansas, PA-C 05/29/23 1116    [address] PA-C 05/29/23 1150    Durwin Glaze, MD 05/29/23 [phone]

## 2023-05-29 NOTE — Assessment & Plan Note (Signed)
Continue allopurinol 100 mg twice daily

## 2023-05-29 NOTE — Consult Note (Signed)
 Renal Service Consult Note [facility]  Deborah [name] 05/29/2023 [name], MD Requesting Physician: Dr. Artis Flock  Reason for Consult: ESRD pt w/ abd pain, diarrhea, SOB and missed HD HPI: The patient is a 58 y.o. year-old w/ PMH as below who presented to ED with morning complaining of abdominal pain, diarrhea for 2 weeks and missed dialysis.  Some shortness of breath.  Also has C. difficile.  In the ED blood pressure 120/80, heart rate 98 respiratory rate 20, on room air with sats of 100%.  Potassium 3.2, creatinine 8.2 white blood count 10.8, Hgb 9.5.  Abdominal CT scan ordered.  Patient to be admitted.  We are asked to see for dialysis   Pt seen in room. In good spirits. Happy her Cdif testing was negative. Says they are using her R chest cath for HD. Her L arm AVF is about 59 yr old and he should be using it soon but says she is having difficulty getting appt.    PMH Anemia Atrial fibrillation Chronic A-fib Chronic pain Chronic systolic heart failure Diabetes mellitus Drug-seeking behavior Gastroparesis Hyperlipidemia Hypertension History of stroke   ROS - denies CP, no joint pain, no HA, no blurry vision, no rash, no diarrhea, no nausea/ vomiting   Past Medical History  Past Medical History:  Diagnosis Date   Acute back pain with sciatica, left    Acute back pain with sciatica, right    Acute encephalopathy 05/29/2022   Acute osteomyelitis of right calcaneus (HCC) 12/21/2022   Anemia, unspecified    Atrial fibrillation with RVR (HCC)-resolved 09/09/2022   Atypical chest pain 09/10/2021   Cancer (HCC)    Carcinoid tumor of duodenum    Chest pain with normal coronary angiography 2019   Chronic a-fib (HCC) 09/09/2022   Chronic pain    Chronic systolic CHF (congestive heart failure) (HCC)    Dehiscence of amputation stump of right lower extremity (HCC) 01/29/2023   Diabetes mellitus    DKA (diabetic ketoacidosis) (HCC)    Drug-seeking  behavior    21 hospitalizations and 14 CT a/p in 2 years for N/V and abdominal pain, demanding only IV dilaudid   Elevated troponin    chronic   Esophageal reflux    Fibromyalgia    Gastric ulcer    Gastroparesis    Gout    HCAP (healthcare-associated pneumonia) 06/19/2022   Hyperlipidemia    Hyperosmolar hyperglycemic state (HHS) (HCC) 05/11/2022   Hyperosmolar non-ketotic state due to type 2 diabetes mellitus (HCC) 05/11/2022   Hypertension    Hypomagnesemia    Lumbosacral stenosis    LVH (left ventricular hypertrophy)    Morbid obesity (HCC)    Nausea & vomiting 09/09/2022   NICM (nonischemic cardiomyopathy) (HCC)    PAF (paroxysmal atrial fibrillation) (HCC)    Sepsis (HCC) 11/23/2022   Stroke (HCC) 02/2011   Symptomatic anemia 12/14/2022   Thrombocytosis    Vitamin B12 deficiency anemia    Past Surgical History  Past Surgical History:  Procedure Laterality Date   ABDOMINAL AORTOGRAM W/LOWER EXTREMITY N/A 11/29/2022   Procedure: ABDOMINAL AORTOGRAM W/LOWER EXTREMITY;  Surgeon: Daria Pastures, MD;  Location: [facility] INVASIVE CV LAB;  Service: Cardiovascular;  Laterality: N/A;   AMPUTATION Right 12/29/2022   Procedure: RIGHT BELOW KNEE AMPUTATION;  Surgeon: Nadara Mustard, MD;  Location: [facility] OR;  Service: Orthopedics;  Laterality: Right;   AV FISTULA PLACEMENT Left 06/30/2022   Procedure: LEFT BRACHIOCEPHALIC ARTERIOVENOUS (AV) FISTULA CREATION;  Surgeon: Sherald Hess  J, MD;  Location: [facility] OR;  Service: Vascular;  Laterality: Left;   BIOPSY  07/27/2019   Procedure: BIOPSY;  Surgeon: Vida Rigger, MD;  Location: [facility] ENDOS[facility];  Service: Endoscopy;;   BIOPSY  07/30/2019   Procedure: BIOPSY;  Surgeon: Kathi Der, MD;  Location: [facility] ENDOS[facility];  Service: Gastroenterology;;   BIOPSY  04/28/2023   Procedure: BIOPSY;  Surgeon: Imogene Burn, MD;  Location: [facility] ENDOSCOPY;  Service: Gastroenterology;;   CATARACT EXTRACTION  01/2014   CHOLECYSTECTOMY     COLONOSCOPY WITH PROPOFOL  N/A 07/30/2019   Procedure: COLONOSCOPY WITH PROPOFOL;  Surgeon: Kathi Der, MD;  [facility]OS[facility];  Service: Gastroenterology;  Laterality: N/A;   ESOPHAGOGASTRODUODENOSCOPY N/A 07/27/2019   Procedure: ESOPHAGOGASTRODUODENOSCOPY (EGD);  Surgeon: Vida Rigger, MD;  Location: [facility] ENDOSCOPY;  Service: Endoscopy;  Laterality: N/A;   ESOPHAGOGASTRODUODENOSCOPY N/A 07/26/2020   Procedure: ESOPHAGOGASTRODUODENOSCOPY (EGD);  Surgeon: Willis Modena, MD;  Location: [facility] ENDOSCOPY;  Service: Endoscopy;  Laterality: N/A;   ESOPHAGOGASTRODUODENOSCOPY (EGD) WITH PROPOFOL N/A 08/02/2019   Procedure: ESOPHAGOGASTRODUODENOSCOPY (EGD) WITH PROPOFOL;  Surgeon: Kathi Der, MD;  [facility];  Service: Gastroenterology;  Laterality: N/A;   ESOPHAGOGASTRODUODENOSCOPY (EGD) WITH PROPOFOL N/A 12/23/2022   Procedure: ESOPHAGOGASTRODUODENOSCOPY (EGD) WITH PROPOFOL;  Surgeon: Shellia Cleverly, DO;  Location: [facility] ENDOS[facility];  Service: Gastroenterology;  Laterality: N/A;   ESOPHAGOGASTRODUODENOSCOPY (EGD) WITH PROPOFOL N/A 04/28/2023   Procedure: ESOPHAGOGASTRODUODENOSCOPY (EGD) WITH PROPOFOL;  Surgeon: Imogene Burn, MD;  Location: [facility] ENDOSCOPY;  Service: Gastroenterology;  Laterality: N/A;   FISTULA SUPERFICIALIZATION Left 12/31/2022   Procedure: LEFT ARM FISTULA TRANSPOSITION;  Surgeon: Maeola Harman, MD;  Location: [facility] OR;  Service: Vascular;  Laterality: Left;   GIVENS CAPSULE STUDY N/A 12/23/2022   Procedure: GIVENS CAPSULE STUDY;  Surgeon: Shellia Cleverly, DO;  Location: [facility] ENDOSCOPY;  Service: Gastroenterology;  Laterality: N/A;   HEMOSTASIS CLIP PLACEMENT  08/02/2019   Procedure: HEMOSTASIS CLIP PLACEMENT;  Surgeon: Kathi Der, MD;  [facility];  Service: Gastroenterology;;   HOT HEMOSTASIS N/A 12/23/2022   Procedure: HOT HEMOSTASIS (ARGON PLASMA COAGULATION/BICAP);  Surgeon: Shellia Cleverly, DO;  Location: [facility] ENDOSCOPY;  Service: Gastroenterology;   Laterality: N/A;   IR FLUORO GUIDE CV LINE RIGHT  06/24/2022   IR US GUIDE VASC ACCESS RIGHT  06/24/2022   POLYPECTOMY  07/30/2019   Procedure: POLYPECTOMY;  Surgeon: Kathi Der, MD;  [facility];  Service: Gastroenterology;;   POLYPECTOMY  08/02/2019   Procedure: POLYPECTOMY;  Surgeon: Kathi Der, MD;  Location: [facility] ENDOSCOPY;  Service: Gastroenterology;;   Gaspar Bidding DILATION N/A 04/28/2023   Procedure: Gaspar Bidding DILATION;  Surgeon: Imogene Burn, MD;  Location: [facility] ENDOSCOPY;  Service: Gastroenterology;  Laterality: N/A;   STUMP REVISION Right 02/02/2023   Procedure: REVISION RIGHT BELOW KNEE AMPUTATION;  Surgeon: Nadara Mustard, MD;  Location: [facility] OR;  Service: Orthopedics;  Laterality: Right;   Family History  Family History  Problem Relation Age of Onset   Diabetes Mother    Diabetes Father    Heart disease Father    Diabetes Sister    Congestive Heart Failure Sister 35   Diabetes Brother    Social History  reports that she has never smoked. She has never used smokeless tobacco. She reports that she does not drink alcohol and does not use drugs. Allergies  Allergies  Allergen Reactions   Gabapentin Hives and [name]   Isovue [Iopamidol] Anaphylaxis, [name] and Other (See Comments)    11/28/17 Patient had seizure  like activity and then 1 min code after 100 cc of isovue 300. Possible contrast allergy vs vasovagal episode  Cardiac Arrest   Nsaids Anaphylaxis and Other (See Comments)    Hx of stomach ulcers   Penicillins Itching, Palpitations and Other (See Comments)    Flushing (Red Skin) Laryngeal Edema   Reglan [Metoclopramide] Other (See Comments)    Tardive dyskinesia    Valium [Diazepam] [name]   Zestril [Lisinopril] Anaphylaxis and Swelling    Tongue and mouth swelling Laryngeal Edema   Tolectin [Tolmetin] Nausea And Vomiting, Nausea Only and Other (See Comments)    Irritates stomach ulcer   Asa [Aspirin] Other (See  Comments)    Hx of stomach ulcer   Aspartame And Phenylalanine Hives   Bentyl [Dicyclomine] Other (See Comments)    Chest pain   Hibiclens [Chlorhexidine Gluconate] Other (See Comments)    Dermatitis    Flexeril [Cyclobenzaprine] Palpitations   Oxycontin [Oxycodone] Palpitations   Rifamycins Palpitations   Tylenol [Acetaminophen] Nausea And Vomiting, Nausea Only and Other (See Comments)    Irritates stomach ulcer Abdominal pain   Ultram [Tramadol] Nausea And Vomiting and Palpitations   Home medications Prior to Admission medications   Medication Sig Start Date End Date Taking? Authorizing Provider  vancomycin (VANCOCIN) 125 MG capsule Take 125 mg by mouth 4 (four) times daily. 05/13/23  Yes [provider]  albuterol (PROVENTIL) (2.5 MG/3ML) 0.083% nebulizer solution Take 3 mLs (2.5 mg total) by nebulization every 6 (six) hours as needed for wheezing or shortness of breath. 04/06/19   Bing Neighbors, NP  allopurinol (ZYLOPRIM) 100 MG tablet Take 1 tablet (100 mg total) by mouth 2 (two) times daily. 11/30/22   Azucena Fallen, MD  ascorbic acid (VITAMIN C) 500 MG tablet Take 500 mg by mouth daily.    [provider]  atorvastatin (LIPITOR) 10 MG tablet Take 10 mg by mouth every evening. 12/03/21   [provider]  busPIRone (BUSPAR) 5 MG tablet Take 1 tablet (5 mg total) by mouth 3 (three) times daily. 05/04/23   Rodolph Bong, MD  cetirizine (ZYRTEC) 10 MG tablet Take 10 mg by mouth daily. 08/05/22   [provider]  famotidine (PEPCID) 20 MG tablet Take 20 mg by mouth daily before breakfast.    [provider]  ferric citrate (AURYXIA) 1 GM 210 MG(Fe) tablet Take 2 tablets (420 mg total) by mouth 3 (three) times daily with meals. Patient taking differently: Take 210 mg by mouth 3 (three) times daily with meals. 11/30/22   Azucena Fallen, MD  fluticasone [facility]) 50 MCG/ACT nasal spray Place 2 sprays into both nostrils daily as  needed for allergies or rhinitis. 12/19/18   Rai, Delene Ruffini, MD  folic acid (FOLVITE) 1 MG tablet Take 1 tablet (1 mg total) by mouth daily. 01/10/23   Danford, Earl Lites, MD  HYDROmorphone (DILAUDID) 2 MG tablet Take 1 tablet (2 mg total) by mouth every 6 (six) hours as needed for severe pain (pain score 7-10) or moderate pain (pain score 4-6). 05/04/23   Rodolph Bong, MD  hydrOXYzine (ATARAX) 25 MG tablet Take 1 tablet (25 mg total) by mouth 3 (three) times daily as needed for anxiety. Patient taking differently: Take 25 mg by mouth See admin instructions. Give 25 mg (1 tablet) every 6 hours as needed for anxiety, agitation for 14 days (starting 11/19), then every 8 hours as needed. 01/10/23   Danford, Earl Lites, MD  insulin lispro (HUMALOG) 100 UNIT/ML KwikPen Before each meal 3 times a day, 140-199 - 2 units, 200-250 - 6 units, 251-299 - 8 units,  300-349 - 12 units,  350 or above 14 units. Patient taking differently: Inject 0-14 Units into the skin See admin instructions. Inject 0-14 units per sliding scale before meals: < 70 notify MD 70-139 : 0 units 140-199 : 2 units 200-250 : 6 units 251-299 : 8 units 300-349 : 12 units 350-400 : 14 units > 401 notify MD. 06/04/22   Leroy Sea, MD  Lactobacillus (ACIDOPHILUS) CAPS capsule Take 1 capsule by mouth 3 (three) times daily with meals. 03/08/23   Elgergawy, Leana Roe, MD  leptospermum manuka honey (MEDIHONEY) PSTE paste Apply 1 Application topically daily. 03/08/23   Elgergawy, Leana Roe, MD  loperamide (IMODIUM) 2 MG capsule Take 1 capsule (2 mg total) by mouth as needed for diarrhea or loose stools. 03/08/23   Elgergawy, Leana Roe, MD  methocarbamol (ROBAXIN) 500 MG tablet Take 1 tablet (500 mg total) by mouth every 6 (six) hours as needed for muscle spasms. 01/10/23   Danford, Earl Lites, MD  metoprolol tartrate (LOPRESSOR) 25 MG tablet Take 1 tablet (25 mg total) by mouth 2 (two) times daily. 03/08/23   Elgergawy, Leana Roe,  MD  midodrine (PROAMATINE) 10 MG tablet Take 1 tablet (10 mg total) by mouth 3 (three) times daily. 05/04/23   Pola Corn, NP  midodrine (PROAMATINE) 10 MG tablet Take 1 tablet (10 mg total) by mouth Every Tuesday,Thursday,and Saturday with dialysis. 05/05/23   Pola Corn, NP  mirtazapine (REMERON) 15 MG tablet Take 15 mg by mouth at bedtime. 10/18/22   [provider]  Multiple Vitamins-Minerals (SENIOR MULTIVITAMIN PLUS PO) Take 1 tablet by mouth daily.    [provider]  nutrition supplement, JUVEN, (JUVEN) PACK Take 1 packet by mouth 2 (two) times daily between meals. 01/10/23   Danford, Earl Lites, MD  Nutritional Supplements (,FEEDING SUPPLEMENT, PROSOURCE PLUS) liquid Take 30 mLs by mouth 2 (two) times daily between meals. 01/10/23   Danford, Earl Lites, MD  ondansetron (ZOFRAN-ODT) 4 MG disintegrating tablet Take 1 tablet (4 mg total) by mouth every 8 (eight) hours as needed for nausea or vomiting. 05/04/23   Rodolph Bong, MD  pantoprazole (PROTONIX) 40 MG tablet Take 1 tablet (40 mg total) by mouth 2 (two) times daily. 02/18/23   Raulkar, Drema Pry, MD  risperiDONE (RISPERDAL M-TABS) 1 MG disintegrating tablet Take 1 tablet (1 mg total) by mouth at bedtime. 05/04/23   Rodolph Bong, MD     Vitals:   05/29/23 1610 05/29/23 0815 05/29/23 0830 05/29/23 0949  BP:  (!) 113/58 104/60   Pulse:  93 88   Resp:  20 17   Temp: 97.9 F (36.6 C)   97.7 F (36.5 C)  TempSrc: Oral   Oral  SpO2:  100% 100%   Weight:      Height:       Exam Gen alert, no distress No rash, cyanosis or gangrene Sclera anicteric, throat clear  No jvd or bruits Chest clear bilat to bases, no rales/ wheezing RRR no MRG Abd soft ntnd no mass or ascites +bs GU defer MS R BKA, no joint effusions  Ext 1-2+ LLE edema, no other edema Neuro is alert, Ox 3 , nf  RIJ TDC/ L AVF +bruit     Renal-related home meds: Auryxia 2 tablets AC 3 times daily Metoprolol 25  twice  daily Midodrine 10 mg 3 times daily and 10 mg predialysis TTS Others: Risperdal, Remeron, insulin, famotidine, BuSpar, statin, Zyloprim, p.o. vancomycin     OP HD: [facility]   B400 101.5 kg 3K bath TDC   heparin none last HD 3/1 9, post weight 105.5 kg Not getting to dry weight, 4-8 kg over Mircera 225 mcg every 2 weeks Hectorol 4 mcg 3 times weekly  Assessment/ Plan: Chronic diarrhea / hx of Cdif, taking po vanc. Per pmd ESRD: on HD TTS. Last HD Tuesday 3/18. Pt w/o severe vol or lab issues. Told pt will plan HD off schedule tomorrow, then can do Th- Sat to finish the week. High census precludes back-back HD. Pt is in agreement.  BP: chronic hypotension on midodrine at home. Cont midodrine.  Volume: is up 3 kg by wts, mild LE edema. UFG 3 L w/ HD tomorrow Anemia of esrd: Hb 9-10 , follow.  Secondary hyperparathyroidism: CCa in range, cont binders ac.       Vinson Moselle  MD [facility] 05/29/2023, 1:05 PM  Recent Labs  Lab 05/29/23 0301  HGB 9.5*  ALBUMIN 2.2*  CALCIUM 8.3*  CREATININE 8.22*  K 3.2*   Inpatient medications:  heparin  5,000 Units Subcutaneous Q8H   insulin aspart  0-6 Units Subcutaneous TID WC   pantoprazole (PROTONIX) IV  40 mg Intravenous Q12H    HYDROmorphone (DILAUDID) injection, ondansetron (ZOFRAN) IV

## 2023-05-29 NOTE — Assessment & Plan Note (Deleted)
 Baseline hgb 8-9 Stable, continue to monitor

## 2023-05-30 ENCOUNTER — Ambulatory Visit: Admitting: Orthopedic Surgery

## 2023-05-30 ENCOUNTER — Telehealth: Payer: Self-pay | Admitting: Orthopedic Surgery

## 2023-05-30 DIAGNOSIS — Z992 Dependence on renal dialysis: Secondary | ICD-10-CM | POA: Diagnosis not present

## 2023-05-30 DIAGNOSIS — K3184 Gastroparesis: Secondary | ICD-10-CM | POA: Diagnosis not present

## 2023-05-30 DIAGNOSIS — R110 Nausea: Secondary | ICD-10-CM | POA: Diagnosis not present

## 2023-05-30 DIAGNOSIS — Z89511 Acquired absence of right leg below knee: Secondary | ICD-10-CM

## 2023-05-30 DIAGNOSIS — E11621 Type 2 diabetes mellitus with foot ulcer: Secondary | ICD-10-CM | POA: Diagnosis present

## 2023-05-30 DIAGNOSIS — E1143 Type 2 diabetes mellitus with diabetic autonomic (poly)neuropathy: Secondary | ICD-10-CM | POA: Diagnosis present

## 2023-05-30 DIAGNOSIS — L97521 Non-pressure chronic ulcer of other part of left foot limited to breakdown of skin: Secondary | ICD-10-CM | POA: Diagnosis not present

## 2023-05-30 DIAGNOSIS — K529 Noninfective gastroenteritis and colitis, unspecified: Secondary | ICD-10-CM | POA: Diagnosis not present

## 2023-05-30 DIAGNOSIS — M79622 Pain in left upper arm: Secondary | ICD-10-CM | POA: Diagnosis not present

## 2023-05-30 DIAGNOSIS — T82510A Breakdown (mechanical) of surgically created arteriovenous fistula, initial encounter: Secondary | ICD-10-CM | POA: Diagnosis not present

## 2023-05-30 DIAGNOSIS — Z6841 Body Mass Index (BMI) 40.0 and over, adult: Secondary | ICD-10-CM | POA: Diagnosis not present

## 2023-05-30 DIAGNOSIS — R109 Unspecified abdominal pain: Secondary | ICD-10-CM | POA: Diagnosis not present

## 2023-05-30 DIAGNOSIS — E1151 Type 2 diabetes mellitus with diabetic peripheral angiopathy without gangrene: Secondary | ICD-10-CM | POA: Diagnosis present

## 2023-05-30 DIAGNOSIS — I5043 Acute on chronic combined systolic (congestive) and diastolic (congestive) heart failure: Secondary | ICD-10-CM | POA: Diagnosis present

## 2023-05-30 DIAGNOSIS — D638 Anemia in other chronic diseases classified elsewhere: Secondary | ICD-10-CM | POA: Diagnosis not present

## 2023-05-30 DIAGNOSIS — N186 End stage renal disease: Secondary | ICD-10-CM | POA: Diagnosis present

## 2023-05-30 DIAGNOSIS — L97429 Non-pressure chronic ulcer of left heel and midfoot with unspecified severity: Secondary | ICD-10-CM | POA: Diagnosis present

## 2023-05-30 DIAGNOSIS — E119 Type 2 diabetes mellitus without complications: Secondary | ICD-10-CM | POA: Diagnosis not present

## 2023-05-30 DIAGNOSIS — F32A Depression, unspecified: Secondary | ICD-10-CM | POA: Diagnosis present

## 2023-05-30 DIAGNOSIS — T82898A Other specified complication of vascular prosthetic devices, implants and grafts, initial encounter: Secondary | ICD-10-CM | POA: Diagnosis not present

## 2023-05-30 DIAGNOSIS — I509 Heart failure, unspecified: Secondary | ICD-10-CM | POA: Diagnosis not present

## 2023-05-30 DIAGNOSIS — I132 Hypertensive heart and chronic kidney disease with heart failure and with stage 5 chronic kidney disease, or end stage renal disease: Secondary | ICD-10-CM | POA: Diagnosis present

## 2023-05-30 DIAGNOSIS — I482 Chronic atrial fibrillation, unspecified: Secondary | ICD-10-CM | POA: Diagnosis present

## 2023-05-30 DIAGNOSIS — R45851 Suicidal ideations: Secondary | ICD-10-CM | POA: Diagnosis present

## 2023-05-30 DIAGNOSIS — E1122 Type 2 diabetes mellitus with diabetic chronic kidney disease: Secondary | ICD-10-CM | POA: Diagnosis present

## 2023-05-30 DIAGNOSIS — I428 Other cardiomyopathies: Secondary | ICD-10-CM | POA: Diagnosis present

## 2023-05-30 DIAGNOSIS — Z794 Long term (current) use of insulin: Secondary | ICD-10-CM | POA: Diagnosis not present

## 2023-05-30 DIAGNOSIS — M7989 Other specified soft tissue disorders: Secondary | ICD-10-CM | POA: Diagnosis not present

## 2023-05-30 DIAGNOSIS — R112 Nausea with vomiting, unspecified: Secondary | ICD-10-CM | POA: Diagnosis not present

## 2023-05-30 DIAGNOSIS — K7581 Nonalcoholic steatohepatitis (NASH): Secondary | ICD-10-CM | POA: Diagnosis present

## 2023-05-30 DIAGNOSIS — D631 Anemia in chronic kidney disease: Secondary | ICD-10-CM | POA: Diagnosis present

## 2023-05-30 DIAGNOSIS — E871 Hypo-osmolality and hyponatremia: Secondary | ICD-10-CM | POA: Diagnosis not present

## 2023-05-30 DIAGNOSIS — K2101 Gastro-esophageal reflux disease with esophagitis, with bleeding: Secondary | ICD-10-CM | POA: Diagnosis present

## 2023-05-30 DIAGNOSIS — N2581 Secondary hyperparathyroidism of renal origin: Secondary | ICD-10-CM | POA: Diagnosis present

## 2023-05-30 DIAGNOSIS — I5033 Acute on chronic diastolic (congestive) heart failure: Secondary | ICD-10-CM | POA: Diagnosis not present

## 2023-05-30 DIAGNOSIS — I480 Paroxysmal atrial fibrillation: Secondary | ICD-10-CM | POA: Diagnosis present

## 2023-05-30 DIAGNOSIS — R079 Chest pain, unspecified: Secondary | ICD-10-CM | POA: Diagnosis present

## 2023-05-30 LAB — CBC
HCT: 28.3 % — ABNORMAL LOW (ref 36.0–46.0)
Hemoglobin: 8.5 g/dL — ABNORMAL LOW (ref 12.0–15.0)
MCH: 30.1 pg (ref 26.0–34.0)
MCHC: 30 g/dL (ref 30.0–36.0)
MCV: 100.4 fL — ABNORMAL HIGH (ref 80.0–100.0)
Platelets: 304 10*3/uL (ref 150–400)
RBC: 2.82 MIL/uL — ABNORMAL LOW (ref 3.87–5.11)
RDW: 16.1 % — ABNORMAL HIGH (ref 11.5–15.5)
WBC: 11.8 10*3/uL — ABNORMAL HIGH (ref 4.0–10.5)
nRBC: 0.2 % (ref 0.0–0.2)

## 2023-05-30 LAB — GASTROINTESTINAL PANEL BY PCR, STOOL (REPLACES STOOL CULTURE)

## 2023-05-30 LAB — BASIC METABOLIC PANEL
Anion gap: 11 (ref 5–15)
BUN: 38 mg/dL — ABNORMAL HIGH (ref 6–20)
CO2: 16 mmol/L — ABNORMAL LOW (ref 22–32)
Calcium: 8.3 mg/dL — ABNORMAL LOW (ref 8.9–10.3)
Chloride: 112 mmol/L — ABNORMAL HIGH (ref 98–111)
Creatinine, Ser: 8.66 mg/dL — ABNORMAL HIGH (ref 0.44–1.00)
GFR, Estimated: 5 mL/min — ABNORMAL LOW (ref >60)
Glucose, Bld: 124 mg/dL — ABNORMAL HIGH (ref 70–99)
Potassium: 3.6 mmol/L (ref 3.5–5.1)
Sodium: 139 mmol/L (ref 135–145)

## 2023-05-30 LAB — GLUCOSE, CAPILLARY
Glucose-Capillary: 134 mg/dL — ABNORMAL HIGH (ref 70–99)
Glucose-Capillary: 251 mg/dL — ABNORMAL HIGH (ref 70–99)
Glucose-Capillary: 135 mg/dL — ABNORMAL HIGH (ref 70–99)
Glucose-Capillary: 209 mg/dL — ABNORMAL HIGH (ref 70–99)

## 2023-05-30 LAB — HEPATITIS B SURFACE ANTIGEN: Hepatitis B Surface Ag: NONREACTIVE

## 2023-05-30 MED ORDER — LIDOCAINE HCL (PF) 1 % IJ SOLN: 5.0000 mL | INTRAMUSCULAR | Status: DC | PRN

## 2023-05-30 MED ORDER — ALTEPLASE 2 MG IJ SOLR
2.0000 mg | Freq: Once | INTRAMUSCULAR | Status: DC | PRN
  Filled 2023-05-30: qty 2

## 2023-05-30 MED ORDER — NEPRO/CARBSTEADY PO LIQD: 237.0000 mL | ORAL | Status: DC | PRN

## 2023-05-30 MED ORDER — LOPERAMIDE HCL 2 MG PO CAPS
2.0000 mg | ORAL_CAPSULE | ORAL | Status: AC | PRN
  Administered 2023-05-30 – 2023-05-31 (×2): 2 mg via ORAL
  Filled 2023-05-30 (×2): qty 1

## 2023-05-30 MED ORDER — PENTAFLUOROPROP-TETRAFLUOROETH EX AERO: 1.0000 | INHALATION_SPRAY | CUTANEOUS | Status: DC | PRN

## 2023-05-30 MED ORDER — HEPARIN SODIUM (PORCINE) 1000 UNIT/ML DIALYSIS
1000.0000 [IU] | INTRAMUSCULAR | Status: DC | PRN
  Administered 2023-05-31: 1000 [IU]
  Filled 2023-05-30 (×3): qty 1

## 2023-05-30 MED ORDER — ANTICOAGULANT SODIUM CITRATE 4% (200MG/5ML) IV SOLN
5.0000 mL | Status: DC | PRN
  Filled 2023-05-30: qty 5

## 2023-05-30 MED ORDER — LIDOCAINE-PRILOCAINE 2.5-2.5 % EX CREA
1.0000 | TOPICAL_CREAM | CUTANEOUS | Status: DC | PRN
  Filled 2023-05-30: qty 5

## 2023-05-30 MED ORDER — PANTOPRAZOLE SODIUM 40 MG PO TBEC
40.0000 mg | DELAYED_RELEASE_TABLET | Freq: Two times a day (BID) | ORAL | Status: DC
  Administered 2023-05-30 – 2023-06-20 (×39): 40 mg via ORAL
  Filled 2023-05-30 (×33): qty 1
  Filled 2023-05-30: qty 2
  Filled 2023-05-30 (×6): qty 1

## 2023-05-30 NOTE — Telephone Encounter (Signed)
 Patient called and said she is in the hospital and if duda can come see her. CB#[phone]

## 2023-05-30 NOTE — Care Management Obs Status (Signed)
 MEDICARE OBSERVATION STATUS NOTIFICATION   Patient Details  Name: Deborah [name] MRN: [id number] Date of Birth: [birth year]   Medicare Observation Status Notification Given:  Yes    Leone Haven, RN 05/30/2023, 9:09 AM

## 2023-05-30 NOTE — Progress Notes (Signed)
 PROGRESS NOTE    Deborah [name]  UEA:[id number] DOB: Nov 25, 1965 DOA: 05/29/2023 PCP: Pcp, No   Brief Narrative:  This 58 yrs old female with PMH significant of  ESRD on dialysis, gastroparesis, GERD, PAF not on AC, diastolic CHF with preserved EF 55 to 60%, gout, duodenal carcinoid tumor, PVD s/p right-sided BKA, history of gastric AVM, NASH, depression, anxiety, obesity, restrictive lung disease, spinal stenosis, chronic pain syndrome,T2DM, chronic left heel diabetic ulcer, and history of chronic diarrhea in the setting of carcinoid tumor who presented to ED with complaints of diarrhea in setting of C.Diff and abdominal pain.  Patient has missed hemodialysis because of having intractable nausea and vomiting.  Patient has been taking oral vancomycin for C. Difficile+.  CT A&P no acute findings.  Patient is admitted for fluid overload due to missed hemodialysis due to diarrhea and intractable nausea and vomiting.   Assessment & Plan:   Principal Problem:   Intractable nausea Active Problems:   Anemia of chronic disease   Chronic diarrhea with history of C.Diff   Acute on chronic diastolic CHF (congestive heart failure) (HCC)   Chronic hypokalemia   Chronic pain syndrome/chronic abdominal pain   ESRD on dialysis (HCC)   Insulin dependent type 2 diabetes mellitus (HCC)   Paroxysmal atrial fibrillation (HCC)   Malignant carcinoid tumor of duodenum (HCC)   GERD (gastroesophageal reflux disease)   Chronic depression/anxiety   Chronic gout   Chronic wound of left heel   Intractable Nausea: Patient reports history of chronic abdominal pain and diarrhea,   She presented with worsening diarrhea, abdominal pain (She was recently treated for C. difficile at her SNF). CT abdomen/pelvis : No acute intraabdominal abnormality found. Continue IV Dilaudid for pain control.   Continue IV protonix  Continue IV Zofran and Phenergan as needed for nausea and vomiting Stool for C. difficile  negative. She has missed dialysis x 2 session and is volume overloaded and could be contributing to her nausea,. Nephrology is following, getting hemodialysis Advance diet as tolerated.    Anemia of chronic disease: Baseline Hgb 8-9 Stable, continue to monitor   Chronic diarrhea with history of C.Diff: She was recently diagnosed with C.Diff at SNF and treated.  She has Completed course of vanc and flagyl Repeat C. difficile in the ED is negative. Start imodium as needed.   Acute on chronic diastolic CHF: Volume overloaded as she missed her dialysis x 2 Nephrology consulted Continue Volume management per HD    Chronic hypokalemia: Likely secondary to diarrhea Replaced.  Continue to monitor   Chronic pain syndrome/chronic abdominal pain: Continue IV Dilaudid as needed for pain. Continue home Robaxin as needed and gabapentin    ESRD on dialysis [facility]): Last dialysis session was Tuesday March 18th  Volume overloaded. Continue dialysis as per nephrology.   Insulin dependent type 2 diabetes mellitus (HCC) Hb A1C of 5.7 in 01/2023 Continue sensitive SSI and accuchecks QAC/HS    Paroxysmal atrial fibrillation (HCC): Continue metoprolol.  HR controlled. No longer on Eliquis due to GI bleeding in 2024 October.    Malignant carcinoid tumor of duodenum (HCC) S/p excision    GERD (gastroesophageal reflux disease) Continue PPI BID> change to IV while not tolerating PO    Chronic depression/anxiety Continue remeron, buspar and hydroxyzine    Chronic gout Continue allopurinol 100 mg twice daily.    Chronic wound of left heel: Wound consult.     DVT prophylaxis: Heparin Code Status: Full code Family Communication: No family at  bedside Disposition Plan:    Status is: Observation The patient remains OBS appropriate and will d/c before 2 midnights. Admitted for intractable nausea and vomiting, leading to missed hemodialysis causing fluid overload.  Nephrology is  consulted.  Consultants:  Nephrology  Procedures: CT A/P Antimicrobials:  Anti-infectives (From admission, onward)    None       Subjective: Patient was seen and examined at bedside.  Overnight events noted. She was sitting comfortably on the edge of the bed,  having breakfast. Patient has right BKA 3 months back.  She still reports having diarrhea,  asking for antidiarrheal medications.  Objective: Vitals:   05/29/23 2040 05/30/23 0041 05/30/23 0500 05/30/23 0816  BP: 93/70 110/70 112/66 108/61  Pulse: (!) 110 89 94 93  Resp: 20 18 17 16   Temp: 98.1 F (36.7 C) 98.1 F (36.7 C) 98.1 F (36.7 C) 97.8 F (36.6 C)  TempSrc: Axillary Oral Oral Oral  SpO2: 99% 100% 100% 100%  Weight:      Height:        Intake/Output Summary (Last 24 hours) at 05/30/2023 1127 Last data filed at 05/29/2023 2000 Gross per 24 hour  Intake 240 ml  Output --  Net 240 ml   Filed Weights   05/29/23 0233 05/29/23 1451  Weight: 96.2 kg (P) 104.8 kg    Examination:  General exam: Appears calm and comfortable, deconditioned, not in any acute distress. Respiratory system: Clear to auscultation. Respiratory effort normal.  RR 15 Cardiovascular system: S1 & S2 heard, RRR. No JVD, murmurs, rubs, gallops or clicks.  Gastrointestinal system: Abdomen is non distended, soft , mildly tender.  Normal bowel sounds heard. Central nervous system: Alert and oriented x 3. No focal neurological deficits. Extremities: Right BKA, no edema, no cyanosis, no clubbing. Skin: No rashes, lesions or ulcers Psychiatry: Judgement and insight appear normal. Mood & affect appropriate.     Data Reviewed: I have personally reviewed following labs and imaging studies  CBC: Recent Labs  Lab 05/29/23 0301 05/30/23 0316  WBC 10.8* 11.8*  NEUTROABS 7.2  --   HGB 9.5* 8.5*  HCT 32.1* 28.3*  MCV 100.0 100.4*  PLT 362 304   Basic Metabolic Panel: Recent Labs  Lab 05/29/23 0301 05/29/23 1529 05/30/23 0316  NA  137  --  139  K 3.2*  --  3.6  CL 107  --  112*  CO2 17*  --  16*  GLUCOSE 216*  --  124*  BUN 35*  --  38*  CREATININE 8.22*  --  8.66*  CALCIUM 8.3*  --  8.3*  MG  --  2.3  --    GFR: Estimated Creatinine Clearance: 8.2 mL/min (A) (by C-G formula based on SCr of 8.66 mg/dL (H)). Liver Function Tests: Recent Labs  Lab 05/29/23 0301  AST 14*  ALT 11  ALKPHOS 101  BILITOT 0.9  PROT 6.5  ALBUMIN 2.2*   Recent Labs  Lab 05/29/23 0301  LIPASE 43   No results for input(s): "AMMONIA" in the last 168 hours. Coagulation Profile: No results for input(s): "INR", "PROTIME" in the last 168 hours. Cardiac Enzymes: No results for input(s): "CKTOTAL", "CKMB", "CKMBINDEX", "TROPONINI" in the last 168 hours. BNP (last 3 results) No results for input(s): "PROBNP" in the last 8760 hours. HbA1C: No results for input(s): "HGBA1C" in the last 72 hours. CBG: Recent Labs  Lab 05/29/23 1221 05/29/23 1655 05/29/23 2111 05/30/23 0611  GLUCAP 155* 151* 219* 134*   Lipid Profile:  No results for input(s): "CHOL", "HDL", "LDLCALC", "TRIG", "CHOLHDL", "LDLDIRECT" in the last 72 hours. Thyroid Function Tests: No results for input(s): "TSH", "T4TOTAL", "FREET4", "T3FREE", "THYROIDAB" in the last 72 hours. Anemia Panel: No results for input(s): "VITAMINB12", "FOLATE", "FERRITIN", "TIBC", "IRON", "RETICCTPCT" in the last 72 hours. Sepsis Labs: No results for input(s): "PROCALCITON", "LATICACIDVEN" in the last 168 hours.  Recent Results (from the past 240 hours)  C Difficile Quick Screen w PCR reflex     Status: None   Collection Time: 05/29/23  6:18 AM   Specimen: STOOL  Result Value Ref Range Status   C Diff antigen NEGATIVE NEGATIVE Final   C Diff toxin NEGATIVE NEGATIVE Final   C Diff interpretation No C. difficile detected.  Final    Comment: Performed at [facility], [address], [place], [place] [address]  Gastrointestinal Panel by PCR , Stool     Status: None    Collection Time: 05/29/23 10:30 AM   Specimen: Stool  Result Value Ref Range Status   Campylobacter species NOT DETECTED NOT DETECTED Final   Plesimonas shigelloides NOT DETECTED NOT DETECTED Final   Salmonella species NOT DETECTED NOT DETECTED Final   Yersinia enterocolitica NOT DETECTED NOT DETECTED Final   Vibrio species NOT DETECTED NOT DETECTED Final   Vibrio cholerae NOT DETECTED NOT DETECTED Final   Enteroaggregative E coli (EAEC) NOT DETECTED NOT DETECTED Final   Enteropathogenic E coli (EPEC) NOT DETECTED NOT DETECTED Final   Enterotoxigenic E coli (ETEC) NOT DETECTED NOT DETECTED Final   Shiga like toxin producing E coli (STEC) NOT DETECTED NOT DETECTED Final   Shigella/Enteroinvasive E coli (EIEC) NOT DETECTED NOT DETECTED Final   Cryptosporidium NOT DETECTED NOT DETECTED Final   Cyclospora cayetanensis NOT DETECTED NOT DETECTED Final   Entamoeba histolytica NOT DETECTED NOT DETECTED Final   Giardia lamblia NOT DETECTED NOT DETECTED Final   Adenovirus F40/41 NOT DETECTED NOT DETECTED Final   Astrovirus NOT DETECTED NOT DETECTED Final   Norovirus GI/GII NOT DETECTED NOT DETECTED Final   Rotavirus A NOT DETECTED NOT DETECTED Final   Sapovirus (I, II, IV, and V) NOT DETECTED NOT DETECTED Final    Comment: Performed at [facility], [address], [place], [place] [address]    Radiology Studies: CT ABDOMEN PELVIS WO CONTRAST Result Date: 05/29/2023 CLINICAL DATA:  58 year old female with acute abdominal pain. EXAM: CT ABDOMEN AND PELVIS WITHOUT CONTRAST TECHNIQUE: Multidetector CT imaging of the abdomen and pelvis was performed following the standard protocol without IV contrast. RADIATION DOSE REDUCTION: This exam was performed according to the departmental dose-optimization program which includes automated exposure control, adjustment of the mA and/or kV according to patient size and/or use of iterative reconstruction technique. COMPARISON:  CT Chest, Abdomen, and  Pelvis 04/25/2023. FINDINGS: Lower chest: Heart size remains normal. Negative lung bases. No pericardial or pleural effusion. Hepatobiliary: Chronic cholecystectomy.  Negative noncontrast liver. Pancreas: Negative. Spleen: Negative. Adrenals/Urinary Tract: Normal adrenal glands. Stable noncontrast kidneys are nonobstructed. Diminutive ureters. No nephrolithiasis. Diminutive bladder. Stable pelvic phleboliths. Stomach/Bowel: Mild to moderate retained stool only in the rectosigmoid colon. Decompressed upstream large bowel. Diminutive and normal appendix on series 3, image 48. No dilated small bowel. Retained food in the stomach. Negative duodenum. No free air or free fluid. Vascular/Lymphatic: Mild Calcified aortic atherosclerosis. Vascular patency is not evaluated in the absence of IV contrast. Calcified proximal femoral artery atherosclerosis. No lymphadenopathy. Reproductive: Exophytic, subserosal and pedunculated left uterine fundal fibroid is 5 cm diameter,  stable, partially calcified. Smaller adjacent left cornu fibroid. Negative noncontrast ovaries. Other: No pelvis free fluid. Musculoskeletal: No acute osseous abnormality identified. Chronic degeneration in the spine including bulky facet and endplate spurring at the lumbosacral junction. IMPRESSION: 1. No acute or inflammatory process in the noncontrast abdomen or pelvis. 2. Fibroid Uterus.  Calcified Femoral artery atherosclerosis. Electronically Signed   By: Odessa Fleming M.D.   On: 05/29/2023 06:40   DG Chest Port 1 View Result Date: 05/29/2023 CLINICAL DATA:  Chest pains. EXAM: PORTABLE CHEST 1 VIEW COMPARISON:  AP and lateral chest 04/25/2023 FINDINGS: Right IJ dialysis catheter again terminates at the superior cavoatrial junction. There is increased mild cardiomegaly, mild central vascular prominence without overt edema. The lungs are clear. The sulci are sharp. The mediastinum is normally outlined. No new osseous findings. Degenerative change and mild  dextroscoliosis thoracic spine. Bilateral shoulder DJD. IMPRESSION: 1. Increased mild cardiomegaly and mild central vascular prominence without overt edema. 2. No other evidence of acute chest process. Electronically Signed   By: Almira Bar M.D.   On: 05/29/2023 03:57   Scheduled Meds:  (feeding supplement) PROSource Plus  30 mL Oral BID BM   acidophilus  1 capsule Oral TID WC   allopurinol  100 mg Oral BID   atorvastatin  10 mg Oral QPM   busPIRone  5 mg Oral TID   famotidine  20 mg Oral QAC breakfast   ferric citrate  210 mg Oral TID WC   folic acid  1 mg Oral Daily   Gerhardt's butt cream   Topical BID   heparin  5,000 Units Subcutaneous Q8H   insulin aspart  0-6 Units Subcutaneous TID WC   metoprolol tartrate  25 mg Oral BID   [START ON 05/31/2023] midodrine  10 mg Oral Q T,Th,Sa-HD   mirtazapine  15 mg Oral QHS   pantoprazole  40 mg Oral BID   risperiDONE  1 mg Oral QHS   Continuous Infusions:  anticoagulant sodium citrate       LOS: 0 days    Time spent: 50 mins    Willeen Niece, MD [facility]   If 7PM-7AM, please contact night-coverage

## 2023-05-30 NOTE — Telephone Encounter (Signed)
 Pt is s/p a BKA back in November and was on the sch for an appt today. Pt asking if you will see her in the hospital.
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
## 2023-05-30 NOTE — Consult Note (Signed)
 ORTHOPAEDIC CONSULTATION  REQUESTING PHYSICIAN: Willeen Niece, MD  Chief Complaint: Patient states she has had a 2-week history of diarrhea and recent chest pain.  HPI: Deborah [name] is a 58 y.o. female who presents with right transtibial amputation with swelling as well as a chronic left heel ulcer with swelling of the left lower extremity.  Patient is on dialysis.  Past Medical History:  Diagnosis Date   Acute back pain with sciatica, left    Acute back pain with sciatica, right    Acute encephalopathy 05/29/2022   Acute osteomyelitis of right calcaneus (HCC) 12/21/2022   Anemia, unspecified    Atrial fibrillation with RVR (HCC)-resolved 09/09/2022   Atypical chest pain 09/10/2021   Cancer (HCC)    Carcinoid tumor of duodenum    Chest pain with normal coronary angiography 2019   Chronic a-fib (HCC) 09/09/2022   Chronic pain    Chronic systolic CHF (congestive heart failure) (HCC)    Dehiscence of amputation stump of right lower extremity (HCC) 01/29/2023   Diabetes mellitus    DKA (diabetic ketoacidosis) (HCC)    Drug-seeking behavior    21 hospitalizations and 14 CT a/p in 2 years for N/V and abdominal pain, demanding only IV dilaudid   Elevated troponin    chronic   Esophageal reflux    Fibromyalgia    Gastric ulcer    Gastroparesis    Gout    HCAP (healthcare-associated pneumonia) 06/19/2022   Hyperlipidemia    Hyperosmolar hyperglycemic state (HHS) (HCC) 05/11/2022   Hyperosmolar non-ketotic state due to type 2 diabetes mellitus (HCC) 05/11/2022   Hypertension    Hypomagnesemia    Lumbosacral stenosis    LVH (left ventricular hypertrophy)    Morbid obesity (HCC)    Nausea & vomiting 09/09/2022   NICM (nonischemic cardiomyopathy) (HCC)    PAF (paroxysmal atrial fibrillation) (HCC)    Sepsis (HCC) 11/23/2022   Stroke (HCC) 02/2011   Symptomatic anemia 12/14/2022   Thrombocytosis    Vitamin B12 deficiency anemia    Past Surgical History:   Procedure Laterality Date   ABDOMINAL AORTOGRAM W/LOWER EXTREMITY N/A 11/29/2022   Procedure: ABDOMINAL AORTOGRAM W/LOWER EXTREMITY;  Surgeon: Daria Pastures, MD;  Location: [facility];  Service: Cardiovascular;  Laterality: N/A;   AMPUTATION Right 12/29/2022   Procedure: RIGHT BELOW KNEE AMPUTATION;  Surgeon: Nadara Mustard, MD;  Location: [facility] OR;  Service: Orthopedics;  Laterality: Right;   AV FISTULA PLACEMENT Left 06/30/2022   Procedure: LEFT BRACHIOCEPHALIC ARTERIOVENOUS (AV) FISTULA CREATION;  Surgeon: Cephus Shelling, MD;  Location: [facility] OR;  Service: Vascular;  Laterality: Left;   BIOPSY  07/27/2019   Procedure: BIOPSY;  Surgeon: Vida Rigger, MD;  Location: [facility]OSCOPY;  Service: Endoscopy;;   BIOPSY  07/30/2019   Procedure: BIOPSY;  Surgeon: Kathi Der, MD;  Location: [facility] ENDOSCOPY;  Service: Gastroenterology;;   BIOPSY  04/28/2023   Procedure: BIOPSY;  Surgeon: Imogene Burn, MD;  Location: [facility] ENDOSCOPY;  Service: Gastroenterology;;   CATARACT EXTRACTION  01/2014   CHOLECYSTECTOMY     COLONOSCOPY WITH PROPOFOL N/A 07/30/2019   Procedure: COLONOSCOPY WITH PROPOFOL;  Surgeon: Kathi Der, MD;  Location: [facility]OS[facility];  Service: Gastroenterology;  Laterality: N/A;   ESOPHAGOGASTRODUODENOSCOPY N/A 07/27/2019   Procedure: ESOPHAGOGASTRODUODENOSCOPY (EGD);  Surgeon: Vida Rigger, MD;  Location: [facility]OSCOPY;  Service: Endoscopy;  Laterality: N/A;   ESOPHAGOGASTRODUODENOSCOPY N/A 07/26/2020   Procedure: ESOPHAGOGASTRODUODENOSCOPY (EGD);  Surgeon: Willis Modena, MD;  Location: [facility] ENDOSCOPY;  Service: Endoscopy;  Laterality: N/A;   ESOPHAGOGASTRODUODENOSCOPY (EGD) WITH PROPOFOL N/A 08/02/2019   Procedure: ESOPHAGOGASTRODUODENOSCOPY (EGD) WITH PROPOFOL;  Surgeon: Kathi Der, MD;  Location: [facility];  Service: Gastroenterology;  Laterality: N/A;   ESOPHAGOGASTRODUODENOSCOPY (EGD) WITH PROPOFOL N/A 12/23/2022   Procedure: ESOPHAGOGASTRODUODENOSCOPY (EGD) WITH  PROPOFOL;  Surgeon: Shellia Cleverly, DO;  Location: [facility] ENDOSCOPY;  Service: Gastroenterology;  Laterality: N/A;   ESOPHAGOGASTRODUODENOSCOPY (EGD) WITH PROPOFOL N/A 04/28/2023   Procedure: ESOPHAGOGASTRODUODENOSCOPY (EGD) WITH PROPOFOL;  Surgeon: Imogene Burn, MD;  Location: [facility] ENDOSCOPY;  Service: Gastroenterology;  Laterality: N/A;   FISTULA SUPERFICIALIZATION Left 12/31/2022   Procedure: LEFT ARM FISTULA TRANSPOSITION;  Surgeon: Maeola Harman, MD;  Location: [facility] OR;  Service: Vascular;  Laterality: Left;   GIVENS CAPSULE STUDY N/A 12/23/2022   Procedure: GIVENS CAPSULE STUDY;  Surgeon: Shellia Cleverly, DO;  Location: [facility] ENDOSCOPY;  Service: Gastroenterology;  Laterality: N/A;   HEMOSTASIS CLIP PLACEMENT  08/02/2019   Procedure: HEMOSTASIS CLIP PLACEMENT;  Surgeon: Kathi Der, MD;  Location: [facility]OS[facility];  Service: Gastroenterology;;   HOT HEMOSTASIS N/A 12/23/2022   Procedure: HOT HEMOSTASIS (ARGON PLASMA COAGULATION/BICAP);  Surgeon: Shellia Cleverly, DO;  Location: [facility] ENDOSCOPY;  Service: Gastroenterology;  Laterality: N/A;   IR FLUORO GUIDE CV LINE RIGHT  06/24/2022   IR US GUIDE VASC ACCESS RIGHT  06/24/2022   POLYPECTOMY  07/30/2019   Procedure: POLYPECTOMY;  Surgeon: Kathi Der, MD;  Location: [facility]OS[facility];  Service: Gastroenterology;;   POLYPECTOMY  08/02/2019   Procedure: POLYPECTOMY;  Surgeon: Kathi Der, MD;  Location: [facility] ENDOSCOPY;  Service: Gastroenterology;;   Gaspar Bidding DILATION N/A 04/28/2023   Procedure: Gaspar Bidding DILATION;  Surgeon: Imogene Burn, MD;  Location: [facility] ENDOSCOPY;  Service: Gastroenterology;  Laterality: N/A;   STUMP REVISION Right 02/02/2023   Procedure: REVISION RIGHT BELOW KNEE AMPUTATION;  Surgeon: Nadara Mustard, MD;  Location: [facility] OR;  Service: Orthopedics;  Laterality: Right;   Social History   Socioeconomic History   Marital status: Married    Spouse name: Not on file   Number of children: Not on file   Years of  education: Not on file   Highest education level: Not on file  Occupational History   Not on file  Tobacco Use   Smoking status: Never   Smokeless tobacco: Never  Vaping Use   Vaping status: Never Used  Substance and Sexual Activity   Alcohol use: No   Drug use: No   Sexual activity: Not Currently    Birth control/protection: None  Other Topics Concern   Not on file  Social History Narrative   ** Merged History Encounter **       Social Drivers of Health   Financial Resource Strain: Low Risk (03/23/2021)   Received from [facility])   Financial Resource Strain  Food Insecurity: No Food Insecurity (05/29/2023)   Hunger Vital Sign    Worried About Running Out of Food in the Last Year: Never true    Ran Out of Food in the Last Year: Never true  Transportation Needs: Unmet Transportation Needs (05/29/2023)   PRAPARE - Transportation    Lack of Transportation (Medical): Yes    Lack of Transportation (Non-Medical): Yes  Physical Activity: Not on File (10/31/2017)   Received from [place], [place]   Physical Activity    Physical Activity: 0  Stress: Low Risk (03/23/2021)   Received [facility])   Stress    Over the last 2 weeks,  how often have you been bothered by the following problems: feeling nervous, anxious, on edge?: Not at all    Over the last 2 weeks, how often have you been bothered by the following problems: Not being able to stop or control worrying?: Not at all  Social Connections: Unknown (07/19/2021)   Received from [facility]   Social Network    Social Network: Not on file   Family History  Problem Relation Age of Onset   Diabetes Mother    Diabetes Father    Heart disease Father    Diabetes Sister    Congestive Heart Failure Sister 76   Diabetes Brother    - negative except otherwise stated in the family history section Allergies  Allergen Reactions   Gabapentin Hives  and [name]   Isovue [Iopamidol] Anaphylaxis, [name] and Other (See Comments)    11/28/17 Patient had seizure like activity and then 1 min code after 100 cc of isovue 300. Possible contrast allergy vs vasovagal episode  Cardiac Arrest   Nsaids Anaphylaxis and Other (See Comments)    Hx of stomach ulcers   Penicillins Itching, Palpitations and Other (See Comments)    Flushing (Red Skin) Laryngeal Edema   Reglan [Metoclopramide] Other (See Comments)    Tardive dyskinesia    Valium [Diazepam] [name]   Zestril [Lisinopril] Anaphylaxis and Swelling    Tongue and mouth swelling Laryngeal Edema   Tolectin [Tolmetin] Nausea And Vomiting, Nausea Only and Other (See Comments)    Irritates stomach ulcer   Asa [Aspirin] Other (See Comments)    Hx of stomach ulcer   Aspartame And Phenylalanine Hives   Bentyl [Dicyclomine] Other (See Comments)    Chest pain   Hibiclens [Chlorhexidine Gluconate] Other (See Comments)    Dermatitis    Flexeril [Cyclobenzaprine] Palpitations   Oxycontin [Oxycodone] Palpitations   Rifamycins Palpitations   Tylenol [Acetaminophen] Nausea And Vomiting, Nausea Only and Other (See Comments)    Irritates stomach ulcer Abdominal pain   Ultram [Tramadol] Nausea And Vomiting and Palpitations   Prior to Admission medications   Medication Sig Start Date End Date Taking? Authorizing Provider  albuterol (PROVENTIL) (2.5 MG/3ML) 0.083% nebulizer solution Take 3 mLs (2.5 mg total) by nebulization every 6 (six) hours as needed for wheezing or shortness of breath. 04/06/19  Yes Bing Neighbors, NP  allopurinol (ZYLOPRIM) 100 MG tablet Take 1 tablet (100 mg total) by mouth 2 (two) times daily. 11/30/22  Yes Azucena Fallen, MD  ascorbic acid (VITAMIN C) 500 MG tablet Take 500 mg by mouth daily.   Yes [provider]  atorvastatin (LIPITOR) 10 MG tablet Take 10 mg by mouth every evening. 12/03/21  Yes [provider]   busPIRone (BUSPAR) 5 MG tablet Take 1 tablet (5 mg total) by mouth 3 (three) times daily. 05/04/23  Yes Rodolph Bong, MD  cetirizine (ZYRTEC) 10 MG tablet Take 10 mg by mouth daily. 08/05/22  Yes [provider]  famotidine (PEPCID) 20 MG tablet Take 20 mg by mouth daily before breakfast.   Yes [provider]  ferric citrate (AURYXIA) 1 GM 210 MG(Fe) tablet Take 2 tablets (420 mg total) by mouth 3 (three) times daily with meals. Patient taking differently: Take 210 mg by mouth 3 (three) times daily with meals. 11/30/22  Yes Azucena Fallen, MD  fluticasone [facility]) 50 MCG/ACT nasal spray Place 2 sprays into both nostrils daily as needed for allergies or  rhinitis. 12/19/18  Yes Rai, Ripudeep K, MD  folic acid (FOLVITE) 1 MG tablet Take 1 tablet (1 mg total) by mouth daily. 01/10/23  Yes Danford, Earl Lites, MD  hydrocortisone cream 1 % Apply 1 Application topically 2 (two) times daily.   Yes [provider]  HYDROmorphone (DILAUDID) 2 MG tablet Take 1 tablet (2 mg total) by mouth every 6 (six) hours as needed for severe pain (pain score 7-10) or moderate pain (pain score 4-6). 05/04/23  Yes Rodolph Bong, MD  hydrOXYzine (ATARAX) 25 MG tablet Take 1 tablet (25 mg total) by mouth 3 (three) times daily as needed for anxiety. Patient taking differently: Take 25 mg by mouth See admin instructions. Give 25 mg (1 tablet) every 6 hours as needed for anxiety, agitation for 14 days (starting 11/19), then every 8 hours as needed. 01/10/23  Yes Danford, Earl Lites, MD  insulin lispro (HUMALOG) 100 UNIT/ML KwikPen Before each meal 3 times a day, 140-199 - 2 units, 200-250 - 6 units, 251-299 - 8 units,  300-349 - 12 units,  350 or above 14 units. Patient taking differently: Inject 0-14 Units into the skin See admin instructions. Inject 0-14 units per sliding scale before meals: < 70 notify MD 70-139 : 0 units 140-199 : 2 units 200-250 : 6 units 251-299 : 8  units 300-349 : 12 units 350-400 : 14 units > 401 notify MD. 06/04/22  Yes Leroy Sea, MD  Lactobacillus (ACIDOPHILUS) CAPS capsule Take 1 capsule by mouth 3 (three) times daily with meals. 03/08/23  Yes Elgergawy, Leana Roe, MD  methocarbamol (ROBAXIN) 500 MG tablet Take 1 tablet (500 mg total) by mouth every 6 (six) hours as needed for muscle spasms. 01/10/23  Yes Danford, Earl Lites, MD  metoprolol tartrate (LOPRESSOR) 25 MG tablet Take 1 tablet (25 mg total) by mouth 2 (two) times daily. 03/08/23  Yes Elgergawy, Leana Roe, MD  midodrine (PROAMATINE) 10 MG tablet Take 1 tablet (10 mg total) by mouth Every Tuesday,Thursday,and Saturday with dialysis. 05/05/23  Yes Pola Corn, NP  midodrine (PROAMATINE) 10 MG tablet Take 10 mg by mouth 3 (three) times daily.   Yes [provider]  mirtazapine (REMERON) 15 MG tablet Take 15 mg by mouth at bedtime. 10/18/22  Yes [provider]  Multiple Vitamins-Minerals (SENIOR MULTIVITAMIN PLUS PO) Take 1 tablet by mouth daily.   Yes [provider]  Nutritional Supplements (,FEEDING SUPPLEMENT, PROSOURCE PLUS) liquid Take 30 mLs by mouth 2 (two) times daily between meals. 01/10/23  Yes Danford, Earl Lites, MD  ondansetron (ZOFRAN-ODT) 4 MG disintegrating tablet Take 1 tablet (4 mg total) by mouth every 8 (eight) hours as needed for nausea or vomiting. 05/04/23  Yes Rodolph Bong, MD  pantoprazole (PROTONIX) 40 MG tablet Take 1 tablet (40 mg total) by mouth 2 (two) times daily. Patient taking differently: Take 40 mg by mouth daily. 02/18/23  Yes Raulkar, Drema Pry, MD  risperiDONE (RISPERDAL M-TABS) 1 MG disintegrating tablet Take 1 tablet (1 mg total) by mouth at bedtime. 05/04/23  Yes Rodolph Bong, MD  saccharomyces boulardii (FLORASTOR) 250 MG capsule Take 250 mg by mouth in the morning, at noon, and at bedtime.   Yes [provider]   CT ABDOMEN PELVIS WO CONTRAST Result Date: 05/29/2023 CLINICAL  DATA:  58 year old female with acute abdominal pain. EXAM: CT ABDOMEN AND PELVIS WITHOUT CONTRAST TECHNIQUE: Multidetector CT imaging of the abdomen and pelvis was performed following the standard protocol without  IV contrast. RADIATION DOSE REDUCTION: This exam was performed according to the departmental dose-optimization program which includes automated exposure control, adjustment of the mA and/or kV according to patient size and/or use of iterative reconstruction technique. COMPARISON:  CT Chest, Abdomen, and Pelvis 04/25/2023. FINDINGS: Lower chest: Heart size remains normal. Negative lung bases. No pericardial or pleural effusion. Hepatobiliary: Chronic cholecystectomy.  Negative noncontrast liver. Pancreas: Negative. Spleen: Negative. Adrenals/Urinary Tract: Normal adrenal glands. Stable noncontrast kidneys are nonobstructed. Diminutive ureters. No nephrolithiasis. Diminutive bladder. Stable pelvic phleboliths. Stomach/Bowel: Mild to moderate retained stool only in the rectosigmoid colon. Decompressed upstream large bowel. Diminutive and normal appendix on series 3, image 48. No dilated small bowel. Retained food in the stomach. Negative duodenum. No free air or free fluid. Vascular/Lymphatic: Mild Calcified aortic atherosclerosis. Vascular patency is not evaluated in the absence of IV contrast. Calcified proximal femoral artery atherosclerosis. No lymphadenopathy. Reproductive: Exophytic, subserosal and pedunculated left uterine fundal fibroid is 5 cm diameter, stable, partially calcified. Smaller adjacent left cornu fibroid. Negative noncontrast ovaries. Other: No pelvis free fluid. Musculoskeletal: No acute osseous abnormality identified. Chronic degeneration in the spine including bulky facet and endplate spurring at the lumbosacral junction. IMPRESSION: 1. No acute or inflammatory process in the noncontrast abdomen or pelvis. 2. Fibroid Uterus.  Calcified Femoral artery atherosclerosis. Electronically  Signed   By: Odessa Fleming M.D.   On: 05/29/2023 06:40   DG Chest Port 1 View Result Date: 05/29/2023 CLINICAL DATA:  Chest pains. EXAM: PORTABLE CHEST 1 VIEW COMPARISON:  AP and lateral chest 04/25/2023 FINDINGS: Right IJ dialysis catheter again terminates at the superior cavoatrial junction. There is increased mild cardiomegaly, mild central vascular prominence without overt edema. The lungs are clear. The sulci are sharp. The mediastinum is normally outlined. No new osseous findings. Degenerative change and mild dextroscoliosis thoracic spine. Bilateral shoulder DJD. IMPRESSION: 1. Increased mild cardiomegaly and mild central vascular prominence without overt edema. 2. No other evidence of acute chest process. Electronically Signed   By: Almira Bar M.D.   On: 05/29/2023 03:57   - pertinent xrays, CT, MRI studies were reviewed and independently interpreted  Positive ROS: All other systems have been reviewed and were otherwise negative with the exception of those mentioned in the HPI and as above.  Physical Exam: General: Alert, no acute distress Psychiatric: Patient is competent for consent with normal mood and affect Lymphatic: No axillary or cervical lymphadenopathy Cardiovascular: No pedal edema Respiratory: No cyanosis, no use of accessory musculature GI: No organomegaly, abdomen is soft and non-tender    Images:  @ENCIMAGES @  Labs:  Lab Results  Component Value Date   HGBA1C 5.7 (H) 02/04/2023   HGBA1C 8.1 (H) 06/19/2022   HGBA1C 10.1 (H) 03/21/2022   ESRSEDRATE 90 (H) 01/29/2023   ESRSEDRATE 119 (H) 12/21/2022   ESRSEDRATE 122 (H) 11/24/2022   CRP 5.9 (H) 02/11/2023   CRP 8.0 (H) 02/10/2023   CRP 8.2 (H) 02/09/2023   LABURIC 6.5 02/06/2023   LABURIC 6.7 11/28/2022   LABURIC 6.2 07/04/2022   REPTSTATUS 02/22/2023 FINAL 02/17/2023   CULT  02/17/2023    NO GROWTH 5 DAYS Performed at [facility], [address] [place], [place] [address]    LABORGA ESCHERICHIA  COLI (A) 08/15/2021    Lab Results  Component Value Date   ALBUMIN 2.2 (L) 05/29/2023   ALBUMIN 2.8 (L) 05/03/2023   ALBUMIN 2.7 (L) 05/02/2023   LABURIC 6.5 02/06/2023   LABURIC 6.7 11/28/2022   LABURIC 6.2  07/04/2022        Latest Ref Rng & Units 05/30/2023    3:16 AM 05/29/2023    3:01 AM 05/03/2023   12:00 PM  CBC EXTENDED  WBC 4.0 - 10.5 K/uL 11.8  10.8  14.0   RBC 3.87 - 5.11 MIL/uL 2.82  3.21  2.95   Hemoglobin 12.0 - 15.0 g/dL 8.5  9.5  8.6   HCT 16.1 - 46.0 % 28.3  32.1  28.7   Platelets 150 - 400 K/uL 304  362  335   NEUT# 1.7 - 7.7 K/uL  7.2    Lymph# 0.7 - 4.0 K/uL  2.6      Neurologic: Patient does not have protective sensation bilateral lower extremities.   MUSCULOSKELETAL:   Skin: Examination of the left lower extremity patient does have swelling.  There is no tenderness to palpation no cellulitis no ulcers no drainage.  Patient has a Wagner grade 1 ulcer over the lateral aspect of the left heel.  This has healthy granulation tissue at the base 1 cm in diameter with no drainage no tunneling.  Examination the right below-knee amputation patient does have swelling of the residual right leg.  There is no tenderness to palpation no cellulitis drainage or ulcers.  Patient's white cell count is 11.8 with normal neutrophil lymphocyte ratio.  Albumin 2.2.  Assessment: Assessment: End-stage renal disease on dialysis with swelling of both lower extremities and a Wagner grade 1 ulcer over the lateral aspect of the left heel.  Plan: Plan: Will place an order for a stump shrinker to be worn on the right side.  Will place an order for Vashe dressing changes to the left heel to be changed daily.  Patient is extremely deconditioned at this point and she will need physical therapy for generalized strengthening.  Patient was a resident at skilled nursing.  I will follow-up in the office after discharge.  Thank you for the consult and the opportunity to see Ms.  Charmaine Downs, MD [facility] [phone] 6:13 PM

## 2023-05-30 NOTE — Progress Notes (Signed)
 Patient ID: Deborah [name], female   DOB: 02/18/1966, 58 y.o.   MRN: [id number] S: reports some improvement of chest and abdominal pain but still with watery diarrhea O:BP 108/61 (BP Location: Right Wrist)   Pulse 93   Temp 97.8 F (36.6 C) (Oral)   Resp 16   Ht (P) 5\' 6"  (1.676 m)   Wt (P) 104.8 kg   LMP 10/10/2012   SpO2 100%   BMI (P) 37.29 kg/m   Intake/Output Summary (Last 24 hours) at 05/30/2023 0841 Last data filed at 05/29/2023 2000 Gross per 24 hour  Intake 240 ml  Output --  Net 240 ml   Intake/Output: I/O last 3 completed shifts: In: 240 [P.O.:240] Out: -   Intake/Output this shift:  No intake/output data recorded. Weight change:  Gen: NAD CVS: RRR Resp:CTA Abd: +BS, soft, NT/nD Ext: no edema  Recent Labs  Lab 05/29/23 0301 05/30/23 0316  NA 137 139  K 3.2* 3.6  CL 107 112*  CO2 17* 16*  GLUCOSE 216* 124*  BUN 35* 38*  CREATININE 8.22* 8.66*  ALBUMIN 2.2*  --   CALCIUM 8.3* 8.3*  AST 14*  --   ALT 11  --    Liver Function Tests: Recent Labs  Lab 05/29/23 0301  AST 14*  ALT 11  ALKPHOS 101  BILITOT 0.9  PROT 6.5  ALBUMIN 2.2*   Recent Labs  Lab 05/29/23 0301  LIPASE 43   No results for input(s): "AMMONIA" in the last 168 hours. CBC: Recent Labs  Lab 05/29/23 0301 05/30/23 0316  WBC 10.8* 11.8*  NEUTROABS 7.2  --   HGB 9.5* 8.5*  HCT 32.1* 28.3*  MCV 100.0 100.4*  PLT 362 304   Cardiac Enzymes: No results for input(s): "CKTOTAL", "CKMB", "CKMBINDEX", "TROPONINI" in the last 168 hours. CBG: Recent Labs  Lab 05/29/23 1221 05/29/23 1655 05/29/23 2111 05/30/23 0611  GLUCAP 155* 151* 219* 134*    Iron Studies: No results for input(s): "IRON", "TIBC", "TRANSFERRIN", "FERRITIN" in the last 72 hours. Studies/Results: CT ABDOMEN PELVIS WO CONTRAST Result Date: 05/29/2023 CLINICAL DATA:  58 year old female with acute abdominal pain. EXAM: CT ABDOMEN AND PELVIS WITHOUT CONTRAST TECHNIQUE: Multidetector CT imaging of the  abdomen and pelvis was performed following the standard protocol without IV contrast. RADIATION DOSE REDUCTION: This exam was performed according to the departmental dose-optimization program which includes automated exposure control, adjustment of the mA and/or kV according to patient size and/or use of iterative reconstruction technique. COMPARISON:  CT Chest, Abdomen, and Pelvis 04/25/2023. FINDINGS: Lower chest: Heart size remains normal. Negative lung bases. No pericardial or pleural effusion. Hepatobiliary: Chronic cholecystectomy.  Negative noncontrast liver. Pancreas: Negative. Spleen: Negative. Adrenals/Urinary Tract: Normal adrenal glands. Stable noncontrast kidneys are nonobstructed. Diminutive ureters. No nephrolithiasis. Diminutive bladder. Stable pelvic phleboliths. Stomach/Bowel: Mild to moderate retained stool only in the rectosigmoid colon. Decompressed upstream large bowel. Diminutive and normal appendix on series 3, image 48. No dilated small bowel. Retained food in the stomach. Negative duodenum. No free air or free fluid. Vascular/Lymphatic: Mild Calcified aortic atherosclerosis. Vascular patency is not evaluated in the absence of IV contrast. Calcified proximal femoral artery atherosclerosis. No lymphadenopathy. Reproductive: Exophytic, subserosal and pedunculated left uterine fundal fibroid is 5 cm diameter, stable, partially calcified. Smaller adjacent left cornu fibroid. Negative noncontrast ovaries. Other: No pelvis free fluid. Musculoskeletal: No acute osseous abnormality identified. Chronic degeneration in the spine including bulky facet and endplate spurring at the lumbosacral junction. IMPRESSION: 1. No acute or inflammatory  process in the noncontrast abdomen or pelvis. 2. Fibroid Uterus.  Calcified Femoral artery atherosclerosis. Electronically Signed   By: Odessa Fleming M.D.   On: 05/29/2023 06:40   DG Chest Port 1 View Result Date: 05/29/2023 CLINICAL DATA:  Chest pains. EXAM: PORTABLE  CHEST 1 VIEW COMPARISON:  AP and lateral chest 04/25/2023 FINDINGS: Right IJ dialysis catheter again terminates at the superior cavoatrial junction. There is increased mild cardiomegaly, mild central vascular prominence without overt edema. The lungs are clear. The sulci are sharp. The mediastinum is normally outlined. No new osseous findings. Degenerative change and mild dextroscoliosis thoracic spine. Bilateral shoulder DJD. IMPRESSION: 1. Increased mild cardiomegaly and mild central vascular prominence without overt edema. 2. No other evidence of acute chest process. Electronically Signed   By: Almira Bar M.D.   On: 05/29/2023 03:57    (feeding supplement) PROSource Plus  30 mL Oral BID BM   acidophilus  1 capsule Oral TID WC   allopurinol  100 mg Oral BID   atorvastatin  10 mg Oral QPM   busPIRone  5 mg Oral TID   famotidine  20 mg Oral QAC breakfast   ferric citrate  210 mg Oral TID WC   folic acid  1 mg Oral Daily   Gerhardt's butt cream   Topical BID   heparin  5,000 Units Subcutaneous Q8H   insulin aspart  0-6 Units Subcutaneous TID WC   metoprolol tartrate  25 mg Oral BID   [START ON 05/31/2023] midodrine  10 mg Oral Q T,Th,Sa-HD   mirtazapine  15 mg Oral QHS   pantoprazole (PROTONIX) IV  40 mg Intravenous Q12H   risperiDONE  1 mg Oral QHS    BMET    Component Value Date/Time   NA 139 05/30/2023 0316   NA 137 09/20/2019 1530   K 3.6 05/30/2023 0316   CL 112 (H) 05/30/2023 0316   CO2 16 (L) 05/30/2023 0316   GLUCOSE 124 (H) 05/30/2023 0316   BUN 38 (H) 05/30/2023 0316   BUN 18 09/20/2019 1530   CREATININE 8.66 (H) 05/30/2023 0316   CALCIUM 8.3 (L) 05/30/2023 0316   GFRNONAA 5 (L) 05/30/2023 0316   GFRAA 49 (L) 11/30/2019 0356   CBC    Component Value Date/Time   WBC 11.8 (H) 05/30/2023 0316   RBC 2.82 (L) 05/30/2023 0316   HGB 8.5 (L) 05/30/2023 0316   HGB 8.8 (L) 10/09/2019 0906   HGB 9.6 (L) 09/03/2019 1620   HCT 28.3 (L) 05/30/2023 0316   HCT 30.4 (L)  09/03/2019 1620   PLT 304 05/30/2023 0316   PLT 348 10/09/2019 0906   PLT 451 (H) 09/03/2019 1620   MCV 100.4 (H) 05/30/2023 0316   MCV 85 09/03/2019 1620   MCH 30.1 05/30/2023 0316   MCHC 30.0 05/30/2023 0316   RDW 16.1 (H) 05/30/2023 0316   RDW 13.6 09/03/2019 1620   LYMPHSABS 2.6 05/29/2023 0301   LYMPHSABS 2.2 09/03/2019 1620   MONOABS 0.6 05/29/2023 0301   EOSABS 0.3 05/29/2023 0301   EOSABS 0.3 09/03/2019 1620   BASOSABS 0.1 05/29/2023 0301   BASOSABS 0.1 09/03/2019 1620    [facility]   B400 101.5 kg 3K bath TDC   heparin none last HD 3/1 9, post weight 105.5 kg Not getting to dry weight, 4-8 kg over Mircera 225 mcg every 2 weeks Hectorol 4 mcg 3 times weekly   Assessment/ Plan: Chronic diarrhea / hx of Cdif, taking po vanc.  Per pmd ESRD: on HD TTS. Last HD Tuesday 3/18. Pt w/o severe vol or lab issues. Told pt will plan HD off schedule today, then get back on outpatient schedule Th- Sat to finish the week. High census precludes back-back HD. Pt is in agreement.  BP: chronic hypotension on midodrine at home. Cont midodrine.  Volume: is up 3 kg by wts, mild LE edema. UFG 3 L w/ HD tomorrow Anemia of esrd: Hb 9-10 , follow.  Secondary hyperparathyroidism: CCa in range, cont binders ac.   Irena Cords, MD [facility] [phone]

## 2023-05-31 DIAGNOSIS — R110 Nausea: Secondary | ICD-10-CM | POA: Diagnosis not present

## 2023-05-31 LAB — CBC
HCT: 27 % — ABNORMAL LOW (ref 36.0–46.0)
Hemoglobin: 8.3 g/dL — ABNORMAL LOW (ref 12.0–15.0)
MCH: 30.2 pg (ref 26.0–34.0)
MCHC: 30.7 g/dL (ref 30.0–36.0)
MCV: 98.2 fL (ref 80.0–100.0)
Platelets: 259 10*3/uL (ref 150–400)
RBC: 2.75 MIL/uL — ABNORMAL LOW (ref 3.87–5.11)
RDW: 16.4 % — ABNORMAL HIGH (ref 11.5–15.5)
WBC: 11 10*3/uL — ABNORMAL HIGH (ref 4.0–10.5)
nRBC: 0.4 % — ABNORMAL HIGH (ref 0.0–0.2)

## 2023-05-31 LAB — GLUCOSE, CAPILLARY
Glucose-Capillary: 168 mg/dL — ABNORMAL HIGH (ref 70–99)
Glucose-Capillary: 143 mg/dL — ABNORMAL HIGH (ref 70–99)
Glucose-Capillary: 141 mg/dL — ABNORMAL HIGH (ref 70–99)
Glucose-Capillary: 223 mg/dL — ABNORMAL HIGH (ref 70–99)

## 2023-05-31 LAB — BASIC METABOLIC PANEL
Anion gap: 10 (ref 5–15)
BUN: 23 mg/dL — ABNORMAL HIGH (ref 6–20)
CO2: 23 mmol/L (ref 22–32)
Calcium: 7.9 mg/dL — ABNORMAL LOW (ref 8.9–10.3)
Chloride: 103 mmol/L (ref 98–111)
Creatinine, Ser: 5.37 mg/dL — ABNORMAL HIGH (ref 0.44–1.00)
GFR, Estimated: 9 mL/min — ABNORMAL LOW (ref >60)
Glucose, Bld: 144 mg/dL — ABNORMAL HIGH (ref 70–99)
Potassium: 3.5 mmol/L (ref 3.5–5.1)
Sodium: 136 mmol/L (ref 135–145)

## 2023-05-31 LAB — PHOSPHORUS: Phosphorus: 6.1 mg/dL — ABNORMAL HIGH (ref 2.5–4.6)

## 2023-05-31 LAB — MAGNESIUM: Magnesium: 1.9 mg/dL (ref 1.7–2.4)

## 2023-05-31 LAB — HEPATITIS B SURFACE ANTIBODY, QUANTITATIVE: Hep B S AB Quant (Post): 403 m[IU]/mL

## 2023-05-31 MED ORDER — LOPERAMIDE HCL 2 MG PO CAPS
2.0000 mg | ORAL_CAPSULE | ORAL | Status: DC | PRN
  Administered 2023-05-31 – 2023-06-06 (×6): 2 mg via ORAL
  Filled 2023-05-31 (×6): qty 1

## 2023-05-31 MED ORDER — METHOCARBAMOL 500 MG PO TABS
500.0000 mg | ORAL_TABLET | Freq: Four times a day (QID) | ORAL | Status: DC | PRN
  Administered 2023-05-31 – 2023-06-11 (×20): 500 mg via ORAL
  Filled 2023-05-31 (×23): qty 1

## 2023-05-31 NOTE — Discharge Instructions (Signed)
 TRANSPORTATION: -[place] Department of Health: Call [facility] and [employer] at [phone] for details. [email or web address]  -[employer] [place]: [employer] [place] is the [employer]'s shared-ride transportation service for eligible riders who have a disability that prevents them from riding the fixed route bus. Call [phone]. [employer] [place] riders must pay a fare of $1.50 per trip, or may purchase a 10-ride punch card for $14.00 ($1.40 per ride) or a 40-ride punch card for $48.00 ($1.20 per ride).  -The [employer] rideshare transportation service is provided for senior citizens (60+) who live independently within [place] city limits and are unable to drive or have limited access to transportation. Call [phone] to schedule an appointment.  -[place] [employer]: For Medicare or Medicaid recipients call [phone]?[name], wheelchair Zenaida Niece, and ambulatory quotes available.   MEDICAID TRANSPORTATION: -If you have a Medicaid "blue card" or "pink card" and have no other means for transportation to doctor's offices, clinics, dentists, hospitals, and other health related trip needs.  -Transportation services are available to all [place] locations. Trips to [facility] and [place] are provided in association with [employer]. -Services are provided between 6:00AM and 9:00PM Monday-Friday. -Call [phone] to schedule a trip or request further information.  medications, including herbals, vitamins, non-steroidal anti-inflammatory  drugs (NSAIDs) and supplements.  This website has more information on Eliquis (apixaban): http://www.eliquis.com/eliquis/home

## 2023-05-31 NOTE — Progress Notes (Signed)
 TRH night cross cover note:   Per patient request, I have resumed her home prn Imodium and home prn Robaxin.  Newton Pigg, DO Hospitalist

## 2023-05-31 NOTE — Progress Notes (Signed)
 Completed 3 hours of dialysis. Treatment was complicated with low blood pressure. Patient was symptomatic. Saline bolus was given to support BP. Right Novelty TDC hep-locked and de-accessed using aseptic technique. Clamped and replaced caps securely. Patient is alert and oriented, no apparent signs of distress upon leaving dialysis unit. Handoff given to Jaynie Crumble Bristow,RN

## 2023-05-31 NOTE — Progress Notes (Signed)
 Orthopedic Tech Progress Note Patient Details:  ALNISA HASLEY 1966-02-15 [id number]  Called in order to [facility] for  4 XL SHRINKERS for a BKA   Patient ID: Molli Barrows, female   DOB: [birth year], 58 y.o.   MRN: [id number]  [name] 05/31/2023, 7:54 AM

## 2023-05-31 NOTE — Progress Notes (Signed)
 CSW reviewed SDOH needs and added transportation resources to patients AVS.   Johnnette Gourd, MSW, LCSWA Transitions of Care [phone]

## 2023-05-31 NOTE — Progress Notes (Signed)
 PT Cancellation Note  Patient Details Name: Deborah [name] MRN: [id number] DOB: April 02, 1965   Cancelled Treatment:    Reason Eval/Treat Not Completed: Medical issues which prohibited therapy;Fatigue/lethargy limiting ability to participate  Patient falling asleep mid-sentence and reports she was up all night with abdominal pain and diarrhea. Able to obtain some history, however repeatedly falling asleep and ultimately states she just cannot do anything with PT at this time. Will re-attempt later today as schedule permits (although pt requesting PT return 3/26).   Noted pt from SNF and reports she was working with PT at [facility]. Anticipate she will need continued SNF with skilled therapies.    Jerolyn Center, PT Acute Rehabilitation Services  Office [phone]   Zena Amos 05/31/2023, 9:35 AM

## 2023-05-31 NOTE — Plan of Care (Signed)
  Problem: Fluid Volume: Goal: Ability to maintain a balanced intake and output will improve Outcome: Not Progressing   Problem: Nutritional: Goal: Maintenance of adequate nutrition will improve Outcome: Not Progressing   Problem: Activity: Goal: Risk for activity intolerance will decrease Outcome: Not Progressing   Problem: Coping: Goal: Level of anxiety will decrease Outcome: Not Progressing   Problem: Pain Managment: Goal: General experience of comfort will improve and/or be controlled Outcome: Not Progressing   Problem: Safety: Goal: Ability to remain free from injury will improve Outcome: Progressing

## 2023-05-31 NOTE — NC FL2 (Signed)
 [place] MEDICAID FL2 LEVEL OF CARE FORM     IDENTIFICATION  Patient Name: Deborah [name] Birthdate: [birth year] Sex: female Admission Date (Current Location): 05/29/2023  [facility] and [place] Number:  Producer, television/film/video and Address:  [facility], [address] [place], [place] [address]      Provider Number: [id number]  Attending Physician Name and Address:  Willeen Niece, MD  Relative Name and Phone Number:       Current Level of Care: Hospital Recommended Level of Care: Skilled Nursing Facility Prior Approval Number:    Date Approved/Denied:   PASRR Number: [id number]  Discharge Plan: SNF    Current Diagnoses: Patient Active Problem List   Diagnosis Date Noted   Acute on chronic diastolic CHF (congestive heart failure) (HCC) 05/29/2023   Intractable nausea 05/29/2023   Chronic wound of left heel 05/29/2023   Amputation of right lower extremity below knee (HCC) 04/30/2023   Non-pressure chronic ulcer of other part of left foot limited to breakdown of skin (HCC) 04/30/2023   Dysphagia 04/28/2023   Volume overload 04/25/2023   Chronic diastolic CHF (congestive heart failure) (HCC) 04/25/2023   Carcinoid tumor of abdomen 04/25/2023   PVD (peripheral vascular disease) (HCC) 04/25/2023   History of GI bleed 04/25/2023   History of arteriovenous malformation (AVM) 04/25/2023   Generalized anxiety disorder 04/25/2023   History of reactive airway disease 04/25/2023   History of spinal stenosis 04/25/2023   Chronic diabetic ulcer of left foot determined by examination (HCC) 04/25/2023   Diarrhea 03/01/2023   Intractable diarrhea 02/28/2023   Sinus tachycardia 02/17/2023   Cellulitis of heel, left 02/17/2023   Hx of BKA, right (HCC) 01/31/2023   Tardive dyskinesia 01/06/2023   Peutz-Jeghers polyps of small bowel (HCC) 12/24/2022   Gastric AVM 12/23/2022   Anemia of chronic disease 12/14/2022   Pressure injury of skin 11/30/2022    Non-pressure chronic ulcer of right heel and midfoot limited to breakdown of skin (HCC) 11/24/2022   ESRD on dialysis (HCC) 09/09/2022   Peripheral neuropathy 09/09/2022   (HFpEF) heart failure with preserved ejection fraction (HCC) 09/09/2022   CHF (congestive heart failure) (HCC) 07/13/2022   Hyponatremia 05/29/2022   Leukocytosis 05/11/2022   Edema of left upper extremity 03/21/2022   Acute pyelonephritis    Paroxysmal atrial fibrillation (HCC)    Chronic pain syndrome/chronic abdominal pain 09/04/2019   Malignant carcinoid tumor of duodenum (HCC)    NASH (nonalcoholic steatohepatitis) 06/05/2019   Chronic diarrhea with history of C.Diff    Restrictive lung disease secondary to obesity    History of gastric ulcer    Fibroid uterus 02/23/2019   Abdominal pain 07/17/2018   Anxiety 11/29/2017   Spinal stenosis, lumbar region with neurogenic claudication 08/03/2017   GERD (gastroesophageal reflux disease) 03/19/2017   Chronic depression/anxiety 03/19/2017   Morbid obesity (HCC)    Normocytic anemia 08/16/2016   Chronic gout 06/05/2016   Chronic hypokalemia 09/26/2015   Hypomagnesemia and hypokalemia 09/26/2015   Insulin dependent type 2 diabetes mellitus (HCC) 05/25/2015   Essential hypertension 09/28/2013    Orientation RESPIRATION BLADDER Height & Weight     Self, Time, Situation, Place  Normal Continent Weight: 246 lb 0.5 oz (111.6 kg) Height:  (P) 5\' 6"  (167.6 cm)  BEHAVIORAL SYMPTOMS/MOOD NEUROLOGICAL BOWEL NUTRITION STATUS      Continent Diet (see dc summary)  AMBULATORY STATUS COMMUNICATION OF NEEDS Skin    (see dc summary) Verbally PU Stage and Appropriate Care, Other (  Comment) (Pressure Injury - Sacrum Stage 1; Wound / Incision - Diabetic ulcer Heel Left Chronic per Orthopedic)                       Personal Care Assistance Level of Assistance  Bathing, Feeding, Dressing Bathing Assistance:  (see dc summary) Feeding assistance:  (see dc summary) Dressing  Assistance:  (see dc summary)     Functional Limitations Info  Sight, Hearing, Speech Sight Info: Adequate Hearing Info: Adequate Speech Info: Adequate    SPECIAL CARE FACTORS FREQUENCY  PT (By licensed PT), OT (By licensed OT)     PT Frequency: 5x week OT Frequency: 5x week            Contractures Contractures Info: Not present    Additional Factors Info  Code Status, Allergies, Insulin Sliding Scale, Psychotropic Code Status Info: Full Allergies Info: Gabapentin, Isovue (Iopamidol), Nsaids, Penicillins, Reglan (Metoclopramide), Valium (Diazepam), Zestril (Lisinopril), Tolectin (Tolmetin), Asa (Aspirin), Aspartame And Phenylalanine, Bentyl (Dicyclomine), Hibiclens (Chlorhexidine Gluconate), Flexeril (Cyclobenzaprine), Oxycontin (Oxycodone), Rifamycins, Tylenol (Acetaminophen), Ultram (Tramadol) Psychotropic Info: Buspar Insulin Sliding Scale Info: see dc summary       Current Medications (05/31/2023):  This is the current hospital active medication list Current Facility-Administered Medications  Medication Dose Route Frequency Provider Last Rate Last Admin   (feeding supplement) PROSource Plus liquid 30 mL  30 mL Oral BID BM Orland Mustard, MD       acidophilus (RISAQUAD) capsule 1 capsule  1 capsule Oral TID WC Orland Mustard, MD   1 capsule at 05/31/23 1610   allopurinol (ZYLOPRIM) tablet 100 mg  100 mg Oral BID Orland Mustard, MD   100 mg at 05/31/23 9604   atorvastatin (LIPITOR) tablet 10 mg  10 mg Oral QPM Orland Mustard, MD   10 mg at 05/30/23 1813   busPIRone (BUSPAR) tablet 5 mg  5 mg Oral TID Orland Mustard, MD   5 mg at 05/31/23 1417   famotidine (PEPCID) tablet 20 mg  20 mg Oral QAC breakfast Orland Mustard, MD   20 mg at 05/31/23 0600   ferric citrate (AURYXIA) tablet 210 mg  210 mg Oral TID WC Orland Mustard, MD   210 mg at 05/30/23 5409   folic acid (FOLVITE) tablet 1 mg  1 mg Oral Daily Orland Mustard, MD   1 mg at 05/31/23 8119   Gerhardt's butt cream    Topical BID Orland Mustard, MD   Given at 05/31/23 0951   heparin injection 5,000 Units  5,000 Units Subcutaneous Marcene Brawn, MD   5,000 Units at 05/31/23 1418   HYDROmorphone (DILAUDID) injection 1 mg  1 mg Intravenous Q3H PRN Orland Mustard, MD   1 mg at 05/31/23 1419   hydrOXYzine (ATARAX) tablet 25 mg  25 mg Oral TID PRN Orland Mustard, MD   25 mg at 05/31/23 0600   insulin aspart (novoLOG) injection 0-6 Units  0-6 Units Subcutaneous TID WC Orland Mustard, MD   1 Units at 05/31/23 1478   loperamide (IMODIUM) capsule 2 mg  2 mg Oral PRN Willeen Niece, MD   2 mg at 05/30/23 1244   metoprolol tartrate (LOPRESSOR) tablet 25 mg  25 mg Oral BID Orland Mustard, MD   25 mg at 05/31/23 0917   midodrine (PROAMATINE) tablet 10 mg  10 mg Oral Q T,Th,Sa-HD Orland Mustard, MD   10 mg at 05/31/23 0200   mirtazapine (REMERON) tablet 15 mg  15 mg Oral QHS Orland Mustard, MD  15 mg at 05/30/23 2155   ondansetron (ZOFRAN) injection 4 mg  4 mg Intravenous Q6H PRN Orland Mustard, MD   4 mg at 05/31/23 0917   pantoprazole (PROTONIX) EC tablet 40 mg  40 mg Oral BID Silvana Newness, RPH   40 mg at 05/31/23 2130   risperiDONE (RISPERDAL M-TABS) disintegrating tablet 1 mg  1 mg Oral QHS Orland Mustard, MD   1 mg at 05/30/23 2155     Discharge Medications: Please see discharge summary for a list of discharge medications.  Relevant Imaging Results:  Relevant Lab Results:   Additional Information [id number]. HD pt: TTS at [facility], [place]

## 2023-05-31 NOTE — Progress Notes (Signed)
 Patient ID: Deborah [name], female   DOB: 1965/06/30, 58 y.o.   MRN: [id number] S: Not feeling well again today.  Reports diarrhea "all night".  She finished HD early this morning. O:BP 115/65 (BP Location: Right Wrist)   Pulse 95   Temp (!) 97.4 F (36.3 C) (Oral)   Resp 16   Ht (P) 5\' 6"  (1.676 m)   Wt 111.6 kg   LMP 10/10/2012   SpO2 100%   BMI (P) 39.71 kg/m   Intake/Output Summary (Last 24 hours) at 05/31/2023 1006 Last data filed at 05/31/2023 0800 Gross per 24 hour  Intake 838 ml  Output 2300 ml  Net -1462 ml   Intake/Output: I/O last 3 completed shifts: In: 960 [P.O.:960] Out: 2300 [Other:2300]  Intake/Output this shift:  Total I/O In: 118 [P.O.:118] Out: -  Weight change:  Gen: NAD CVS: RRR Resp: CTA Abd: +BS< soft, mild tenderness Ext: 1+ edema, LUE AVF +T/B  Recent Labs  Lab 05/29/23 0301 05/30/23 0316  NA 137 139  K 3.2* 3.6  CL 107 112*  CO2 17* 16*  GLUCOSE 216* 124*  BUN 35* 38*  CREATININE 8.22* 8.66*  ALBUMIN 2.2*  --   CALCIUM 8.3* 8.3*  AST 14*  --   ALT 11  --    Liver Function Tests: Recent Labs  Lab 05/29/23 0301  AST 14*  ALT 11  ALKPHOS 101  BILITOT 0.9  PROT 6.5  ALBUMIN 2.2*   Recent Labs  Lab 05/29/23 0301  LIPASE 43   No results for input(s): "AMMONIA" in the last 168 hours. CBC: Recent Labs  Lab 05/29/23 0301 05/30/23 0316  WBC 10.8* 11.8*  NEUTROABS 7.2  --   HGB 9.5* 8.5*  HCT 32.1* 28.3*  MCV 100.0 100.4*  PLT 362 304   Cardiac Enzymes: No results for input(s): "CKTOTAL", "CKMB", "CKMBINDEX", "TROPONINI" in the last 168 hours. CBG: Recent Labs  Lab 05/30/23 0611 05/30/23 1208 05/30/23 1618 05/30/23 2127 05/31/23 0628  GLUCAP 134* 251* 135* 209* 168*    Iron Studies: No results for input(s): "IRON", "TIBC", "TRANSFERRIN", "FERRITIN" in the last 72 hours. Studies/Results: No results found.  (feeding supplement) PROSource Plus  30 mL Oral BID BM   acidophilus  1 capsule Oral TID WC    allopurinol  100 mg Oral BID   atorvastatin  10 mg Oral QPM   busPIRone  5 mg Oral TID   famotidine  20 mg Oral QAC breakfast   ferric citrate  210 mg Oral TID WC   folic acid  1 mg Oral Daily   Gerhardt's butt cream   Topical BID   heparin  5,000 Units Subcutaneous Q8H   insulin aspart  0-6 Units Subcutaneous TID WC   metoprolol tartrate  25 mg Oral BID   midodrine  10 mg Oral Q T,Th,Sa-HD   mirtazapine  15 mg Oral QHS   pantoprazole  40 mg Oral BID   risperiDONE  1 mg Oral QHS    BMET    Component Value Date/Time   NA 139 05/30/2023 0316   NA 137 09/20/2019 1530   K 3.6 05/30/2023 0316   CL 112 (H) 05/30/2023 0316   CO2 16 (L) 05/30/2023 0316   GLUCOSE 124 (H) 05/30/2023 0316   BUN 38 (H) 05/30/2023 0316   BUN 18 09/20/2019 1530   CREATININE 8.66 (H) 05/30/2023 0316   CALCIUM 8.3 (L) 05/30/2023 0316   GFRNONAA 5 (L) 05/30/2023 0316   GFRAA 49 (L)  11/30/2019 0356   CBC    Component Value Date/Time   WBC 11.8 (H) 05/30/2023 0316   RBC 2.82 (L) 05/30/2023 0316   HGB 8.5 (L) 05/30/2023 0316   HGB 8.8 (L) 10/09/2019 0906   HGB 9.6 (L) 09/03/2019 1620   HCT 28.3 (L) 05/30/2023 0316   HCT 30.4 (L) 09/03/2019 1620   PLT 304 05/30/2023 0316   PLT 348 10/09/2019 0906   PLT 451 (H) 09/03/2019 1620   MCV 100.4 (H) 05/30/2023 0316   MCV 85 09/03/2019 1620   MCH 30.1 05/30/2023 0316   MCHC 30.0 05/30/2023 0316   RDW 16.1 (H) 05/30/2023 0316   RDW 13.6 09/03/2019 1620   LYMPHSABS 2.6 05/29/2023 0301   LYMPHSABS 2.2 09/03/2019 1620   MONOABS 0.6 05/29/2023 0301   EOSABS 0.3 05/29/2023 0301   EOSABS 0.3 09/03/2019 1620   BASOSABS 0.1 05/29/2023 0301   BASOSABS 0.1 09/03/2019 1620    [facility]   B400 101.5 kg 3K bath TDC   heparin none last HD 3/1 9, post weight 105.5 kg Not getting to dry weight, 4-8 kg over Mircera 225 mcg every 2 weeks Hectorol 4 mcg 3 times weekly   Assessment/ Plan: Chronic diarrhea / hx of Cdif, taking po vanc. Per pmd ESRD: on HD  TTS. Last HD early this morning so back on schedule. High census prevents back-back HD. Pt is in agreement.  BP: chronic hypotension on midodrine at home. Cont midodrine.  Volume: is up 3 kg by wts, mild LE edema. UFG 3 L w/ HD tomorrow Anemia of esrd: Hb 9-10 , follow.  Secondary hyperparathyroidism: CCa in range, cont binders ac.   Irena Cords, MD [facility] [phone]

## 2023-05-31 NOTE — Progress Notes (Signed)
 PROGRESS NOTE    Deborah [name]  ZOX:[id number] DOB: 1965/10/28 DOA: 05/29/2023 PCP: Pcp, No   Brief Narrative:  This 58 yrs old female with PMH significant of  ESRD on dialysis, gastroparesis, GERD, PAF not on AC, diastolic CHF with preserved EF 55 to 60%, gout, duodenal carcinoid tumor, PVD s/p right-sided BKA, history of gastric AVM, NASH, depression, anxiety, obesity, restrictive lung disease, spinal stenosis, chronic pain syndrome,T2DM, chronic left heel diabetic ulcer, and history of chronic diarrhea in the setting of carcinoid tumor who presented to ED with complaints of diarrhea in setting of C.Diff and abdominal pain.  Patient has missed hemodialysis because of having intractable nausea and vomiting.  Patient has been taking oral vancomycin for C. Difficile+.  CT A&P no acute findings.  Patient is admitted for fluid overload due to missed hemodialysis due to diarrhea and intractable nausea and vomiting.   Assessment & Plan:   Principal Problem:   Intractable nausea Active Problems:   Anemia of chronic disease   Chronic diarrhea with history of C.Diff   Acute on chronic diastolic CHF (congestive heart failure) (HCC)   Chronic hypokalemia   Chronic pain syndrome/chronic abdominal pain   ESRD on dialysis (HCC)   Insulin dependent type 2 diabetes mellitus (HCC)   Paroxysmal atrial fibrillation (HCC)   Malignant carcinoid tumor of duodenum (HCC)   GERD (gastroesophageal reflux disease)   Chronic depression/anxiety   Chronic gout   Non-pressure chronic ulcer of other part of left foot limited to breakdown of skin (HCC)   Chronic wound of left heel   Intractable Nausea/ Vomiting : Patient reports history of chronic abdominal pain and diarrhea,   She presented with worsening diarrhea, abdominal pain (She was recently treated for C. difficile at her SNF). CT abdomen/pelvis : No acute intraabdominal abnormality found. Continue IV Dilaudid for pain control.   Continue IV  protonix  Continue IV Zofran and Phenergan as needed for nausea and vomiting Stool for C. difficile negative. She has missed dialysis x 2 session and is volume overloaded and could be contributing to her nausea,. Nephrology is following, getting hemodialysis Advance diet as tolerated.    Anemia of chronic disease: Baseline Hgb 8-9 Stable, continue to monitor   Chronic diarrhea with history of C.Diff: She was recently diagnosed with C.Diff at SNF and treated.  She has Completed course of vanc and flagyl. Repeat C. difficile in the ED is negative. Started imodium as needed.   Acute on chronic diastolic CHF: Volume overloaded as she missed her dialysis x 2 Nephrology consulted Continue Volume management per HD    Chronic hypokalemia: Likely secondary to diarrhea Replaced.  Continue to monitor   Chronic pain syndrome/chronic abdominal pain: Continue IV Dilaudid as needed for pain. Continue home Robaxin as needed and gabapentin    ESRD on dialysis [facility]): Last dialysis session was Tuesday March 18th  Volume overloaded. Continue dialysis as per nephrology.   Insulin dependent type 2 diabetes mellitus (HCC) Hb A1C of 5.7 in 01/2023 Continue sensitive SSI and accuchecks QAC/HS    Paroxysmal atrial fibrillation (HCC): Continue metoprolol.  HR controlled. No longer on Eliquis due to GI bleeding in 2024 October.    Malignant carcinoid tumor of duodenum (HCC) S/p excision    GERD (gastroesophageal reflux disease) Continue PPI BID> change to IV while not tolerating PO    Chronic depression/anxiety Continue remeron, buspar and hydroxyzine    Chronic gout Continue allopurinol 100 mg twice daily.    Chronic wound of left  heel: Wound consult.   DVT prophylaxis: Heparin Code Status: Full code Family Communication: No family at bedside. Disposition Plan:    Status is: Observation The patient remains OBS appropriate and will d/c before 2 midnights.  Admitted for  intractable nausea and vomiting, leading to missed hemodialysis causing fluid overload.  Nephrology is consulted.  Consultants:  Nephrology  Procedures: CT A/P Antimicrobials:  Anti-infectives (From admission, onward)    None       Subjective: Patient was seen and examined at bedside.Overnight events noted. Patient reports having diarrhea all night long. She reports not feeling well. Patient has right BKA 3 months back.  She states imodium takes time but it works.  Objective: Vitals:   05/31/23 0515 05/31/23 0721 05/31/23 1129 05/31/23 1223  BP: 95/65 115/65 (!) 86/45 (!) 94/57  Pulse: 93 95 91   Resp: (!) 21 16 20 16   Temp: 97.7 F (36.5 C) (!) 97.4 F (36.3 C)    TempSrc:  Oral    SpO2: 99% 100% 100%   Weight:      Height:        Intake/Output Summary (Last 24 hours) at 05/31/2023 1331 Last data filed at 05/31/2023 0800 Gross per 24 hour  Intake 118 ml  Output 2300 ml  Net -2182 ml   Filed Weights   05/29/23 0233 05/29/23 1451 05/31/23 0500  Weight: 96.2 kg (P) 104.8 kg 111.6 kg    Examination:  General exam: Appears calm and comfortable, deconditioned, not in any acute distress. Respiratory system: Clear to auscultation. Respiratory effort normal.  RR 14 Cardiovascular system: S1 & S2 heard, RRR. No JVD, murmurs, rubs, gallops or clicks.  Gastrointestinal system: Abdomen is non distended, soft , mildly tender.  Normal bowel sounds heard. Central nervous system: Alert and oriented x 3. No focal neurological deficits. Extremities: Right BKA, no edema, no cyanosis, no clubbing. Skin: No rashes, lesions or ulcers Psychiatry: Judgement and insight appear normal. Mood & affect appropriate.     Data Reviewed: I have personally reviewed following labs and imaging studies  CBC: Recent Labs  Lab 05/29/23 0301 05/30/23 0316 05/31/23 1154  WBC 10.8* 11.8* 11.0*  NEUTROABS 7.2  --   --   HGB 9.5* 8.5* 8.3*  HCT 32.1* 28.3* 27.0*  MCV 100.0 100.4* 98.2  PLT  362 304 259   Basic Metabolic Panel: Recent Labs  Lab 05/29/23 0301 05/29/23 1529 05/30/23 0316 05/31/23 1154  NA 137  --  139 136  K 3.2*  --  3.6 3.5  CL 107  --  112* 103  CO2 17*  --  16* 23  GLUCOSE 216*  --  124* 144*  BUN 35*  --  38* 23*  CREATININE 8.22*  --  8.66* 5.37*  CALCIUM 8.3*  --  8.3* 7.9*  MG  --  2.3  --  1.9  PHOS  --   --   --  6.1*   GFR: Estimated Creatinine Clearance: 14.3 mL/min (A) (by C-G formula based on SCr of 5.37 mg/dL (H)). Liver Function Tests: Recent Labs  Lab 05/29/23 0301  AST 14*  ALT 11  ALKPHOS 101  BILITOT 0.9  PROT 6.5  ALBUMIN 2.2*   Recent Labs  Lab 05/29/23 0301  LIPASE 43   No results for input(s): "AMMONIA" in the last 168 hours. Coagulation Profile: No results for input(s): "INR", "PROTIME" in the last 168 hours. Cardiac Enzymes: No results for input(s): "CKTOTAL", "CKMB", "CKMBINDEX", "TROPONINI" in the last 168 hours.  BNP (last 3 results) No results for input(s): "PROBNP" in the last 8760 hours. HbA1C: No results for input(s): "HGBA1C" in the last 72 hours. CBG: Recent Labs  Lab 05/30/23 1208 05/30/23 1618 05/30/23 2127 05/31/23 0628 05/31/23 1108  GLUCAP 251* 135* 209* 168* 143*   Lipid Profile: No results for input(s): "CHOL", "HDL", "LDLCALC", "TRIG", "CHOLHDL", "LDLDIRECT" in the last 72 hours. Thyroid Function Tests: No results for input(s): "TSH", "T4TOTAL", "FREET4", "T3FREE", "THYROIDAB" in the last 72 hours. Anemia Panel: No results for input(s): "VITAMINB12", "FOLATE", "FERRITIN", "TIBC", "IRON", "RETICCTPCT" in the last 72 hours. Sepsis Labs: No results for input(s): "PROCALCITON", "LATICACIDVEN" in the last 168 hours.  Recent Results (from the past 240 hours)  C Difficile Quick Screen w PCR reflex     Status: None   Collection Time: 05/29/23  6:18 AM   Specimen: STOOL  Result Value Ref Range Status   C Diff antigen NEGATIVE NEGATIVE Final   C Diff toxin NEGATIVE NEGATIVE Final   C  Diff interpretation No C. difficile detected.  Final    Comment: Performed at [facility], [address] [place], [place] [address]  Gastrointestinal Panel by PCR , Stool     Status: None   Collection Time: 05/29/23 10:30 AM   Specimen: Stool  Result Value Ref Range Status   Campylobacter species NOT DETECTED NOT DETECTED Final   Plesimonas shigelloides NOT DETECTED NOT DETECTED Final   Salmonella species NOT DETECTED NOT DETECTED Final   Yersinia enterocolitica NOT DETECTED NOT DETECTED Final   Vibrio species NOT DETECTED NOT DETECTED Final   Vibrio cholerae NOT DETECTED NOT DETECTED Final   Enteroaggregative E coli (EAEC) NOT DETECTED NOT DETECTED Final   Enteropathogenic E coli (EPEC) NOT DETECTED NOT DETECTED Final   Enterotoxigenic E coli (ETEC) NOT DETECTED NOT DETECTED Final   Shiga like toxin producing E coli (STEC) NOT DETECTED NOT DETECTED Final   Shigella/Enteroinvasive E coli (EIEC) NOT DETECTED NOT DETECTED Final   Cryptosporidium NOT DETECTED NOT DETECTED Final   Cyclospora cayetanensis NOT DETECTED NOT DETECTED Final   Entamoeba histolytica NOT DETECTED NOT DETECTED Final   Giardia lamblia NOT DETECTED NOT DETECTED Final   Adenovirus F40/41 NOT DETECTED NOT DETECTED Final   Astrovirus NOT DETECTED NOT DETECTED Final   Norovirus GI/GII NOT DETECTED NOT DETECTED Final   Rotavirus A NOT DETECTED NOT DETECTED Final   Sapovirus (I, II, IV, and V) NOT DETECTED NOT DETECTED Final    Comment: Performed at [facility], [address], [place], [place] [address]    Radiology Studies: No results found.  Scheduled Meds:  (feeding supplement) PROSource Plus  30 mL Oral BID BM   acidophilus  1 capsule Oral TID WC   allopurinol  100 mg Oral BID   atorvastatin  10 mg Oral QPM   busPIRone  5 mg Oral TID   famotidine  20 mg Oral QAC breakfast   ferric citrate  210 mg Oral TID WC   folic acid  1 mg Oral Daily   Gerhardt's butt cream   Topical BID   heparin   5,000 Units Subcutaneous Q8H   insulin aspart  0-6 Units Subcutaneous TID WC   metoprolol tartrate  25 mg Oral BID   midodrine  10 mg Oral Q T,Th,Sa-HD   mirtazapine  15 mg Oral QHS   pantoprazole  40 mg Oral BID   risperiDONE  1 mg Oral QHS   Continuous Infusions:    LOS:  1 day    Time spent: 50 mins    Willeen Niece, MD [facility]   If 7PM-7AM, please contact night-coverage

## 2023-05-31 NOTE — TOC Progression Note (Signed)
 Transition of Care [facility]) - Progression Note    Patient Details  Name: Deborah [name] MRN: [id number] Date of Birth: 1965/11/23  Transition [facility] Care [facility] CM/SW Contact  Michaela Corner, [place] Phone Number: 05/31/2023, 3:44 PM  Clinical Narrative:   CSW completed SNF workup and provided patient with medicare.gov ratings for accepting facilities. Patient stated she does not want to go to [place]. CSW spoke with Tammy regarding patients acceptance to several of the facilities she manages. Per Tammy, CSW will need to submit for insurance auth. CSW awaiting PT/OT notes to submit for insurance auth.   TOC will continue to follow.    Expected Discharge Plan: Skilled Nursing Facility Barriers to Discharge: Continued Medical Work up, Other (must enter comment) (Awaiting PT/OT to see)  Expected Discharge Plan and Services In-house Referral: Clinical Social Work     Living arrangements for the past 2 months: Skilled Holiday representative                   DME Agency: NA                   Social Determinants of Health (SDOH) Interventions SDOH Screenings   Food Insecurity: No Food Insecurity (05/29/2023)  Housing: Patient Declined (05/29/2023)  Recent Concern: Housing - High Risk (04/25/2023)  Transportation Needs: Unmet Transportation Needs (05/29/2023)  Utilities: Not At Risk (05/29/2023)  Depression (PHQ2-9): Medium Risk (03/16/2023)  Financial Resource Strain: Low Risk (03/23/2021)   [facility])  Physical Activity: Not on File (10/31/2017)   Received from [place], [place]  Social Connections: Unknown (07/19/2021)   [facility] from [facility]  Stress: Low Risk (03/23/2021)   [facility])  Tobacco Use: Low Risk  (05/29/2023)    Readmission Risk Interventions    03/07/2023    4:19 PM 11/25/2022    5:16 PM 07/05/2022    1:20 PM  Readmission Risk Prevention Plan   Transportation Screening Complete Complete Complete  Medication Review (RN Care Manager) Complete Complete Referral to Pharmacy  PCP or Specialist appointment within 3-5 days of discharge Complete Complete Complete  HRI or Home Care Consult Complete Complete Complete  SW Recovery Care/Counseling Consult Complete Complete Complete  Palliative Care Screening Complete Not Applicable Not Applicable  Skilled Nursing Facility Not Applicable Not Applicable Not Applicable

## 2023-05-31 NOTE — Progress Notes (Signed)
 Pt receives out-pt HD at [facility] on TTS. Will assist as needed.   Olivia Canter Renal Navigator [phone]

## 2023-06-01 DIAGNOSIS — K529 Noninfective gastroenteritis and colitis, unspecified: Secondary | ICD-10-CM

## 2023-06-01 DIAGNOSIS — R110 Nausea: Secondary | ICD-10-CM | POA: Diagnosis not present

## 2023-06-01 DIAGNOSIS — I5033 Acute on chronic diastolic (congestive) heart failure: Secondary | ICD-10-CM | POA: Diagnosis not present

## 2023-06-01 DIAGNOSIS — D638 Anemia in other chronic diseases classified elsewhere: Secondary | ICD-10-CM | POA: Diagnosis not present

## 2023-06-01 LAB — GLUCOSE, CAPILLARY
Glucose-Capillary: 105 mg/dL — ABNORMAL HIGH (ref 70–99)
Glucose-Capillary: 188 mg/dL — ABNORMAL HIGH (ref 70–99)
Glucose-Capillary: 120 mg/dL — ABNORMAL HIGH (ref 70–99)
Glucose-Capillary: 128 mg/dL — ABNORMAL HIGH (ref 70–99)

## 2023-06-01 MED ORDER — CHOLESTYRAMINE LIGHT 4 G PO PACK
4.0000 g | PACK | Freq: Two times a day (BID) | ORAL | Status: DC
  Administered 2023-06-02 – 2023-06-20 (×6): 4 g via ORAL
  Filled 2023-06-01 (×40): qty 1

## 2023-06-01 NOTE — Progress Notes (Signed)
 PT Cancellation Note  Patient Details Name: Deborah [name] MRN: [id number] DOB: [birth year]   Cancelled Treatment:    Reason Eval/Treat Not Completed: Medical issues which prohibited therapy;Fatigue/lethargy limiting ability to participate  Pt crying out in pain on arrival. Reporting stomach pain and that she has had pain medication. Refused to answer any further questions despite education re: PT eval needed to provide insurance company with information to assist with her placement. Discussed with RN and pt can have additional pain medication ~9:30 a.m. Will reattempt after next dose.    Jerolyn Center, PT Acute Rehabilitation Services  Office [phone]  Zena Amos 06/01/2023, 8:10 AM

## 2023-06-01 NOTE — Progress Notes (Signed)
 PT Cancellation Note  Patient Details Name: Deborah [name] MRN: [id number] DOB: Mar 01, 1966   Cancelled Treatment:    Reason Eval/Treat Not Completed: Pain limiting ability to participate  Spoike with Clayburn Pert, RN as pt has not had pain meds (had planned to give at 9:30). He reports pt's IV is not working and waiting for IV team to come. Patient has already refused this morning due to pain. Will attempt to see after she can have pain medication.    Jerolyn Center, PT Acute Rehabilitation Services  Office [phone]  Zena Amos 06/01/2023, 10:46 AM

## 2023-06-01 NOTE — Plan of Care (Signed)
  Problem: Education: Goal: Ability to describe self-care measures that may prevent or decrease complications (Diabetes Survival Skills Education) will improve 06/01/2023 1702 by Theodosia Blender, RN Outcome: Progressing 06/01/2023 1702 by Theodosia Blender, RN Outcome: Progressing Goal: Individualized Educational Video(s) 06/01/2023 1702 by Theodosia Blender, RN Outcome: Progressing 06/01/2023 1702 by Theodosia Blender, RN Outcome: Progressing   Problem: Coping: Goal: Ability to adjust to condition or change in health will improve 06/01/2023 1702 by Theodosia Blender, RN Outcome: Progressing 06/01/2023 1702 by Theodosia Blender, RN Outcome: Progressing   Problem: Fluid Volume: Goal: Ability to maintain a balanced intake and output will improve 06/01/2023 1702 by Theodosia Blender, RN Outcome: Progressing 06/01/2023 1702 by Theodosia Blender, RN Outcome: Progressing   Problem: Health Behavior/Discharge Planning: Goal: Ability to identify and utilize available resources and services will improve 06/01/2023 1702 by Theodosia Blender, RN Outcome: Progressing 06/01/2023 1702 by Theodosia Blender, RN Outcome: Progressing Goal: Ability to manage health-related needs will improve 06/01/2023 1702 by Theodosia Blender, RN Outcome: Progressing 06/01/2023 1702 by Theodosia Blender, RN Outcome: Progressing

## 2023-06-01 NOTE — Progress Notes (Signed)
 Patient ID: Deborah [name], female   DOB: 1965-07-18, 58 y.o.   MRN: [id number] S: Still complaining of diarrhea O:BP 121/74   Pulse (!) 103   Temp 99.2 F (37.3 C) (Oral)   Resp 14   Ht (P) 5\' 6"  (1.676 m)   Wt 111.6 kg   LMP 10/10/2012   SpO2 100%   BMI (P) 39.71 kg/m   Intake/Output Summary (Last 24 hours) at 06/01/2023 1047 Last data filed at 06/01/2023 0000 Gross per 24 hour  Intake 712 ml  Output --  Net 712 ml   Intake/Output: I/O last 3 completed shifts: In: 830 [P.O.:830] Out: 2300 [Other:2300]  Intake/Output this shift:  No intake/output data recorded. Weight change:  Gen: NAD CVS: tachy Resp:CTA Abd:+BS, soft, NT/ND Ext: s/p RBKA, trace pretibial edema of LLE  Recent Labs  Lab 05/29/23 0301 05/30/23 0316 05/31/23 1154  NA 137 139 136  K 3.2* 3.6 3.5  CL 107 112* 103  CO2 17* 16* 23  GLUCOSE 216* 124* 144*  BUN 35* 38* 23*  CREATININE 8.22* 8.66* 5.37*  ALBUMIN 2.2*  --   --   CALCIUM 8.3* 8.3* 7.9*  PHOS  --   --  6.1*  AST 14*  --   --   ALT 11  --   --    Liver Function Tests: Recent Labs  Lab 05/29/23 0301  AST 14*  ALT 11  ALKPHOS 101  BILITOT 0.9  PROT 6.5  ALBUMIN 2.2*   Recent Labs  Lab 05/29/23 0301  LIPASE 43   No results for input(s): "AMMONIA" in the last 168 hours. CBC: Recent Labs  Lab 05/29/23 0301 05/30/23 0316 05/31/23 1154  WBC 10.8* 11.8* 11.0*  NEUTROABS 7.2  --   --   HGB 9.5* 8.5* 8.3*  HCT 32.1* 28.3* 27.0*  MCV 100.0 100.4* 98.2  PLT 362 304 259   Cardiac Enzymes: No results for input(s): "CKTOTAL", "CKMB", "CKMBINDEX", "TROPONINI" in the last 168 hours. CBG: Recent Labs  Lab 05/31/23 0628 05/31/23 1108 05/31/23 1552 05/31/23 2148 06/01/23 0627  GLUCAP 168* 143* 141* 223* 105*    Iron Studies: No results for input(s): "IRON", "TIBC", "TRANSFERRIN", "FERRITIN" in the last 72 hours. Studies/Results: No results found.  (feeding supplement) PROSource Plus  30 mL Oral BID BM   acidophilus   1 capsule Oral TID WC   allopurinol  100 mg Oral BID   atorvastatin  10 mg Oral QPM   busPIRone  5 mg Oral TID   famotidine  20 mg Oral QAC breakfast   ferric citrate  210 mg Oral TID WC   folic acid  1 mg Oral Daily   Gerhardt's butt cream   Topical BID   heparin  5,000 Units Subcutaneous Q8H   insulin aspart  0-6 Units Subcutaneous TID WC   metoprolol tartrate  25 mg Oral BID   midodrine  10 mg Oral Q T,Th,Sa-HD   mirtazapine  15 mg Oral QHS   pantoprazole  40 mg Oral BID   risperiDONE  1 mg Oral QHS    BMET    Component Value Date/Time   NA 136 05/31/2023 1154   NA 137 09/20/2019 1530   K 3.5 05/31/2023 1154   CL 103 05/31/2023 1154   CO2 23 05/31/2023 1154   GLUCOSE 144 (H) 05/31/2023 1154   BUN 23 (H) 05/31/2023 1154   BUN 18 09/20/2019 1530   CREATININE 5.37 (H) 05/31/2023 1154   CALCIUM 7.9 (L) 05/31/2023  1154   GFRNONAA 9 (L) 05/31/2023 1154   GFRAA 49 (L) 11/30/2019 0356   CBC    Component Value Date/Time   WBC 11.0 (H) 05/31/2023 1154   RBC 2.75 (L) 05/31/2023 1154   HGB 8.3 (L) 05/31/2023 1154   HGB 8.8 (L) 10/09/2019 0906   HGB 9.6 (L) 09/03/2019 1620   HCT 27.0 (L) 05/31/2023 1154   HCT 30.4 (L) 09/03/2019 1620   PLT 259 05/31/2023 1154   PLT 348 10/09/2019 0906   PLT 451 (H) 09/03/2019 1620   MCV 98.2 05/31/2023 1154   MCV 85 09/03/2019 1620   MCH 30.2 05/31/2023 1154   MCHC 30.7 05/31/2023 1154   RDW 16.4 (H) 05/31/2023 1154   RDW 13.6 09/03/2019 1620   LYMPHSABS 2.6 05/29/2023 0301   LYMPHSABS 2.2 09/03/2019 1620   MONOABS 0.6 05/29/2023 0301   EOSABS 0.3 05/29/2023 0301   EOSABS 0.3 09/03/2019 1620   BASOSABS 0.1 05/29/2023 0301   BASOSABS 0.1 09/03/2019 1620    [facility]: [facility] [place] [facility]   B400 101.5 kg 3K bath TDC   heparin none last HD 3/1 9, post weight 105.5 kg Not getting to dry weight, 4-8 kg over Mircera 225 mcg every 2 weeks Hectorol 4 mcg 3 times weekly   Assessment/ Plan: Chronic diarrhea / hx of Cdif, taking po vanc. Per  pmd ESRD: on HD TTS.  Completed HD yesterday without UF due to low bp and needed IVF to support bp.  Plan for HD tomorrow to keep on her schedule.  BP: chronic hypotension on midodrine at home. Cont midodrine.  Volume: she is above edw, however bp dropping with UF due to diarrhea. Anemia of esrd: Hb 9-10 , follow.  Secondary hyperparathyroidism: CCa in range, cont binders ac.   Irena Cords, MD [facility] [phone]

## 2023-06-01 NOTE — Progress Notes (Signed)
 [facility]                                                                              [name], is a 58 y.o. female, DOB - August 06, 1965, ZHY:[id number] Admit date - 05/29/2023    Outpatient Primary MD for the patient is Pcp, No  LOS - 2  days  Chief Complaint  Patient presents with   Chest Pain   Abdominal Pain       Brief summary   Patient is a 58 yrs old female with ESRD on HD TTS, gastroparesis, GERD, PAF not on AC, diastolic CHF with preserved EF 55 to 60%, gout, duodenal carcinoid tumor, PVD s/p right-sided BKA, history of gastric AVM, NASH, depression, anxiety, obesity, restrictive lung disease, spinal stenosis, chronic pain syndrome,T2DM, chronic left heel diabetic ulcer, and history of chronic diarrhea in the setting of carcinoid tumor who presented to ED with complaints of diarrhea in setting of C.Diff and abdominal pain.  Patient has missed hemodialysis because of having intractable nausea and vomiting.  Patient has been taking oral vancomycin for C. Difficile+.  CT A&P no acute findings.  Patient is admitted for fluid overload due to missed hemodialysis due to diarrhea and intractable nausea and vomiting.    Assessment & Plan    Principal Problem: Intractable Nausea/ Vomiting : - Patient reports history of chronic abdominal pain and diarrhea, presented with worsening diarrhea, abdominal pain (She was recently treated for C. difficile at her SNF). -CT abdomen/pelvis : No acute intraabdominal abnormality found. -C. difficile negative inpatient, GI pathogen panel negative -Continue symptomatic management, pain control, antiemetics as needed -Missed HD x 2 sessions and was volume overloaded contributing to her nausea, nephrology following for HD. -Placed on cholestyramine for diarrhea.   Anemia of chronic disease: Baseline Hgb 8-9 Stable, continue to monitor   Chronic diarrhea with history of C.Diff: She was recently diagnosed with C.Diff at  SNF and treated.  -She has completed the course of vancomycin and Flagyl -Repeat C. difficile, GI pathogen panel negative -Placed on scheduled cholestyramine  ESRD on hemodialysis, TTS -Nephrology following, continue HD   Acute on chronic diastolic CHF: Volume overloaded as she missed her dialysis x 2 -Volume management with HD   Chronic hypokalemia: Likely secondary to diarrhea -Replace as needed   Chronic pain syndrome/chronic abdominal pain: Continue IV Dilaudid as needed for pain. Continue home Robaxin as needed and gabapentin     Insulin dependent type 2 diabetes mellitus (HCC) Hb A1C of 5.7 in 01/2023 Continue sensitive SSI  CBG (last 3)  Recent Labs    05/31/23 2148 06/01/23 0627 06/01/23 1101  GLUCAP 223* 105* 188*      Paroxysmal atrial fibrillation (HCC): HR controlled, continue metoprolol No longer on Eliquis due to GI bleeding in 2024 October.    Malignant carcinoid tumor of duodenum (HCC) S/p excision    GERD (gastroesophageal reflux disease) Continue PPI BID -- change to IV while not tolerating PO    Chronic depression/anxiety Continue remeron, buspar and hydroxyzine    Chronic gout Continue allopurinol 100 mg twice daily.    Chronic wound of left heel: Wound care  consult.   Obesity class II Estimated body mass index is 39.71 kg/m (pended) as calculated from the following:   Height as of this encounter: (P) 5\' 6"  (1.676 m).   Weight as of this encounter: 111.6 kg.  Code Status: Full code DVT Prophylaxis:  heparin injection 5,000 Units Start: 05/29/23 1400   Level of Care: Level of care: Telemetry Cardiac Family Communication: Updated patient Disposition Plan:      Remains inpatient appropriate:      Procedures:  Hemodialysis  Consultants:   Nephrology  Antimicrobials:   Anti-infectives (From admission, onward)    None          Medications  (feeding supplement) PROSource Plus  30 mL Oral BID BM   acidophilus  1  capsule Oral TID WC   allopurinol  100 mg Oral BID   atorvastatin  10 mg Oral QPM   busPIRone  5 mg Oral TID   famotidine  20 mg Oral QAC breakfast   ferric citrate  210 mg Oral TID WC   folic acid  1 mg Oral Daily   Gerhardt's butt cream   Topical BID   heparin  5,000 Units Subcutaneous Q8H   insulin aspart  0-6 Units Subcutaneous TID WC   metoprolol tartrate  25 mg Oral BID   midodrine  10 mg Oral Q T,Th,Sa-HD   mirtazapine  15 mg Oral QHS   pantoprazole  40 mg Oral BID   risperiDONE  1 mg Oral QHS      Subjective:   Deborah [name] was seen and examined today.  Still complaining of pain all over, diarrhea.  Wants to be treated for C. difficile although C. difficile inpatient has been negative and recently completed course of treatment at SNF.  Patient denies dizziness, chest pain, shortness of breath.  No fevers, has chronic abdominal pain.   Objective:   Vitals:   05/31/23 1932 05/31/23 2344 06/01/23 0628 06/01/23 0930  BP: 118/78 101/60 (!) 99/55 121/74  Pulse: 100 (!) 105 98 (!) 103  Resp: 20 15 20 14   Temp: (!) 97.3 F (36.3 C) 97.7 F (36.5 C)  99.2 F (37.3 C)  TempSrc: Oral Oral  Oral  SpO2: 100% 100% 100% 100%  Weight:      Height:        Intake/Output Summary (Last 24 hours) at 06/01/2023 1135 Last data filed at 06/01/2023 0000 Gross per 24 hour  Intake 712 ml  Output --  Net 712 ml     Wt Readings from Last 3 Encounters:  05/31/23 111.6 kg  05/03/23 102.8 kg  03/22/23 113.4 kg     Exam General: Alert and oriented x 3, NAD, deconditioned, appears uncomfortable Cardiovascular: S1 S2 auscultated,  RRR Respiratory: Clear to auscultation bilaterally, no wheezing, rales or rhonchi Gastrointestinal: Soft,, nondistended, + bowel sounds, diffuse TTP Ext: no pedal edema bilaterally Neuro: no new deficit Psych: anxious    Data Reviewed:  I have personally reviewed following labs    CBC Lab Results  Component Value Date   WBC 11.0 (H)  05/31/2023   RBC 2.75 (L) 05/31/2023   HGB 8.3 (L) 05/31/2023   HCT 27.0 (L) 05/31/2023   MCV 98.2 05/31/2023   MCH 30.2 05/31/2023   PLT 259 05/31/2023   MCHC 30.7 05/31/2023   RDW 16.4 (H) 05/31/2023   LYMPHSABS 2.6 05/29/2023   MONOABS 0.6 05/29/2023   EOSABS 0.3 05/29/2023   BASOSABS 0.1 05/29/2023     Last metabolic panel Lab  Results  Component Value Date   NA 136 05/31/2023   K 3.5 05/31/2023   CL 103 05/31/2023   CO2 23 05/31/2023   BUN 23 (H) 05/31/2023   CREATININE 5.37 (H) 05/31/2023   GLUCOSE 144 (H) 05/31/2023   GFRNONAA 9 (L) 05/31/2023   GFRAA 49 (L) 11/30/2019   CALCIUM 7.9 (L) 05/31/2023   PHOS 6.1 (H) 05/31/2023   PROT 6.5 05/29/2023   ALBUMIN 2.2 (L) 05/29/2023   BILITOT 0.9 05/29/2023   ALKPHOS 101 05/29/2023   AST 14 (L) 05/29/2023   ALT 11 05/29/2023   ANIONGAP 10 05/31/2023    CBG (last 3)  Recent Labs    05/31/23 2148 06/01/23 0627 06/01/23 1101  GLUCAP 223* 105* 188*      Coagulation Profile: No results for input(s): "INR", "PROTIME" in the last 168 hours.   Radiology Studies: I have personally reviewed the imaging studies  No results found.     Thad Ranger M.D. [facility] 06/01/2023, 11:35 AM  Available via Epic secure chat 7am-7pm After 7 pm, please refer to night coverage provider listed on amion.

## 2023-06-01 NOTE — TOC Progression Note (Signed)
 Transition of Care [facility]) - Progression Note    Patient Details  Name: Deborah [name] MRN: [id number] Date of Birth: 17-Feb-1966  [facility] CM/SW Contact  Michaela Corner, [place] Phone Number: 06/01/2023, 1:42 PM  Clinical Narrative:   CSW provided medicare.gov ratings of updated accepting SNF bed offers. Patient stated she wants to research the facilities herself to make the best choice for her. Patient asked for CSW to follow up at a later time about SNF choice.   TOC will continue to follow.    Expected Discharge Plan: Skilled Nursing Facility Barriers to Discharge: Continued Medical Work up, Other (must enter comment) (Awaiting PT/OT to see)  Expected Discharge Plan and Services In-house Referral: Clinical Social Work     Living arrangements for the past 2 months: Skilled Holiday representative                   DME Agency: NA                   Social Determinants of Health (SDOH) Interventions SDOH Screenings   Food Insecurity: No Food Insecurity (05/29/2023)  Housing: Patient Declined (05/29/2023)  Recent Concern: Housing - High Risk (04/25/2023)  Transportation Needs: Unmet Transportation Needs (05/29/2023)  Utilities: Not At Risk (05/29/2023)  Depression (PHQ2-9): Medium Risk (03/16/2023)  Financial Resource Strain: Low Risk (03/23/2021)   Received from [facility])  Physical Activity: Not on File (10/31/2017)   Received from [place], [place]  Social Connections: Unknown (07/19/2021)   Received from [facility], [facility]  Stress: Low Risk (03/23/2021)   [facility])  Tobacco Use: Low Risk  (05/29/2023)    Readmission Risk Interventions    03/07/2023    4:19 PM 11/25/2022    5:16 PM 07/05/2022    1:20 PM  Readmission Risk Prevention Plan  Transportation Screening Complete Complete Complete  Medication Review (RN Care Manager) Complete Complete Referral to Pharmacy   PCP or Specialist appointment within 3-5 days of discharge Complete Complete Complete  HRI or Home Care Consult Complete Complete Complete  SW Recovery Care/Counseling Consult Complete Complete Complete  Palliative Care Screening Complete Not Applicable Not Applicable  Skilled Nursing Facility Not Applicable Not Applicable Not Applicable

## 2023-06-01 NOTE — Evaluation (Signed)
 Physical Therapy Evaluation Patient Details Name: Deborah [name] MRN: [id number] DOB: 1966-01-25 Today's Date: 06/01/2023  History of Present Illness  58 y/o female presents to [facility] on 05/29/23 with resports of chest pain, abdominal pain, and diahrrea in setting of C-diff. Missed 2 dialysis sessions, fluid overloaded.  PMHx: ESRD on dialysis, gastroparesis, GERD, anemia of chronic disease, PAF, diastolic heart failure with preserved EF 55 to 60%, gout, duodenal carcinoid tumor, PVD s/p R BKA, history of gastric AVM, NASH, reactive dyskinesia, depression, anxiety, obesity, restrictive lung disease, spinal stenosis, chronic pain syndrome, insulin-dependent DM type II, chronic left heel diabetic ulcer, and history of chronic diarrhea 2/2 carcinoid tumor.   Clinical Impression  Pt admitted with above diagnosis. PTA, pt was residing at a SNF and receiving therapy services. She required assistance with all functional mobility, ADLs, and IADLs. She is a baseline w/c user who reports she is unable to propel herself and relies on a hoyer lift to transfer. Pt currently with functional limitations due to the deficits listed below (see PT Problem List). Examination limited by pain and emotional lability. Pt required modA for supine>sit and refused to transfer with maximove. Pt will benefit from acute skilled PT to decrease the physical assistance she requires, increase ROM/strength, and improve balance. Will continue to follow acutely.      If plan is discharge home, recommend the following: A lot of help with walking and/or transfers;A lot of help with bathing/dressing/bathroom;Assistance with cooking/housework;Direct supervision/assist for medications management;Direct supervision/assist for financial management;Assist for transportation;Help with stairs or ramp for entrance   Can travel by private vehicle   No    Equipment Recommendations None recommended by PT (Pt already has DME)  Recommendations  for Other Services       Functional Status Assessment Patient has had a recent decline in their functional status and/or demonstrates limited ability to make significant improvements in function in a reasonable and predictable amount of time     Precautions / Restrictions Precautions Precautions: Fall Recall of Precautions/Restrictions: Impaired Precaution/Restrictions Comments: R BKA Restrictions Weight Bearing Restrictions Per Provider Order: No      Mobility  Bed Mobility Overal bed mobility: Needs Assistance Bed Mobility: Supine to Sit     Supine to sit: HOB elevated, Used rails, Mod assist, Min assist     General bed mobility comments: Pt took increased time to reach EOB. She required minA to bring BLE off EOB and modA at trunk. Pt remained seated EOB at end of session.    Transfers Overall transfer level: Needs assistance                 General transfer comment: Pt refused to transfer secondary to pain. She will require use of maximove to complete transfers. Transfer via Lift Equipment: Maximove  Ambulation/Gait                  Stairs            Wheelchair Mobility     Tilt Bed    Modified Rankin (Stroke Patients Only)       Balance Overall balance assessment: Needs assistance Sitting-balance support: No upper extremity supported, Feet supported Sitting balance-Leahy Scale: Fair Sitting balance - Comments: Pt sat EOB with supervision.                                     Pertinent Vitals/Pain Pain Assessment Pain Assessment: 0-10  Pain Score: 10-Worst pain ever Pain Location: Abdomen Pain Descriptors / Indicators: Moaning, Crying, Grimacing, Guarding Pain Intervention(s): Monitored during session, Limited activity within patient's tolerance, Patient requesting pain meds-RN notified, Repositioned    Home Living Family/patient expects to be discharged to:: Assisted living                 Home Equipment:  Wheelchair - manual;BSC/3in1;Hospital bed;Lift chair Additional Comments: Pt was recieveing therapy services at a SNF.    Prior Function Prior Level of Function : Needs assist       Physical Assist : ADLs (physical);Mobility (physical) Mobility (physical): Bed mobility;Transfers;Gait ADLs (physical): Bathing;Dressing;Toileting;Grooming;IADLs Mobility Comments: Pt reports 1 staff member was helping her in/out of bed. She relies on a hoyer lift to transfer. Pt is a baselie wheelchair user, but reports she is unable to propel herself with BUE or LLE and relies on people around her to manuever. Denies fall history. ADLs Comments: Pt reports the facility staff assist her with everything and she will attempt to help to the best of her ability.     Extremity/Trunk Assessment   Upper Extremity Assessment Upper Extremity Assessment: Right hand dominant;RUE deficits/detail;LUE deficits/detail RUE Deficits / Details: Unable to tolerate AAROM or PROM of shoulder. WFL elbow/wrist ROM. Grossly 2/5 strength. RUE: Unable to fully assess due to pain RUE Sensation: WNL RUE Coordination: decreased fine motor;decreased gross motor LUE Deficits / Details: Unable to tolerate AAROM or PROM of shoulder. WFL elbow/wrist ROM. Grossly 2/5 strength. LUE: Unable to fully assess due to pain LUE Sensation: WNL LUE Coordination: decreased fine motor;decreased gross motor    Lower Extremity Assessment Lower Extremity Assessment: RLE deficits/detail;LLE deficits/detail RLE Deficits / Details: Pt with R BKA. Unable to tolerate AAROM or PROM of hip or knee. Grossly 2/5 strength. RLE: Unable to fully assess due to pain RLE Sensation: decreased light touch;decreased proprioception RLE Coordination: decreased gross motor LLE Deficits / Details: Unable to tolerate AAROM or PROM of hip, knee, or ankle. Grossly 2/5 strength. LLE: Unable to fully assess due to pain LLE Sensation: WNL LLE Coordination: decreased gross  motor    Cervical / Trunk Assessment Cervical / Trunk Assessment: Other exceptions Cervical / Trunk Exceptions: Body Habitus  Communication   Communication Communication: No apparent difficulties    Cognition Arousal: Alert Behavior During Therapy: Lability, Flat affect (Pt varied between a stoic expression and crying out in pain.)   PT - Cognitive impairments: No family/caregiver present to determine baseline, Initiation, Sequencing                       PT - Cognition Comments: Pt A,Ox4. She perseverated on her pain. Following commands: Impaired Following commands impaired: Follows one step commands with increased time     Cueing Cueing Techniques: Verbal cues, Gestural cues     General Comments General comments (skin integrity, edema, etc.): VSS on RA.    Exercises     Assessment/Plan    PT Assessment Patient needs continued PT services  PT Problem List Decreased strength;Decreased range of motion;Decreased activity tolerance;Decreased balance;Decreased mobility;Decreased coordination;Pain;Obesity       PT Treatment Interventions Therapeutic exercise;Therapeutic activities;Balance training;Functional mobility training;DME instruction;Neuromuscular re-education;Wheelchair mobility training;Patient/family education    PT Goals (Current goals can be found in the Care Plan section)  Acute Rehab PT Goals Patient Stated Goal: Have less pain and move with less assistance PT Goal Formulation: With patient Time For Goal Achievement: 06/15/23 Potential to Achieve Goals: Fair  Frequency Min 1X/week     Co-evaluation               AM-PAC PT "6 Clicks" Mobility  Outcome Measure Help needed turning from your back to your side while in a flat bed without using bedrails?: A Lot Help needed moving from lying on your back to sitting on the side of a flat bed without using bedrails?: A Lot Help needed moving to and from a bed to a chair (including a  wheelchair)?: Total Help needed standing up from a chair using your arms (e.g., wheelchair or bedside chair)?: Total Help needed to walk in hospital room?: Total Help needed climbing 3-5 steps with a railing? : Total 6 Click Score: 8    End of Session   Activity Tolerance: Patient limited by pain Patient left: in bed;with call bell/phone within reach Nurse Communication: Mobility status;Need for lift equipment;Patient requests pain meds PT Visit Diagnosis: Muscle weakness (generalized) (M62.81);Pain Pain - Right/Left:  (Both) Pain - part of body:  (Abdomen)    Time: 4098-1191 PT Time Calculation (min) (ACUTE ONLY): 19 min   Charges:   PT Evaluation $PT Eval Moderate Complexity: 1 Mod   PT General Charges $$ ACUTE PT VISIT: 1 Visit         Cheri Guppy, PT, DPT Acute Rehabilitation Services Office: [phone] Secure Chat Preferred   Richardson Chiquito 06/01/2023, 3:55 PM

## 2023-06-02 DIAGNOSIS — I509 Heart failure, unspecified: Secondary | ICD-10-CM | POA: Diagnosis not present

## 2023-06-02 DIAGNOSIS — I5033 Acute on chronic diastolic (congestive) heart failure: Secondary | ICD-10-CM | POA: Diagnosis not present

## 2023-06-02 DIAGNOSIS — D638 Anemia in other chronic diseases classified elsewhere: Secondary | ICD-10-CM | POA: Diagnosis not present

## 2023-06-02 DIAGNOSIS — R110 Nausea: Secondary | ICD-10-CM | POA: Diagnosis not present

## 2023-06-02 LAB — CBC
HCT: 28.3 % — ABNORMAL LOW (ref 36.0–46.0)
Hemoglobin: 8.6 g/dL — ABNORMAL LOW (ref 12.0–15.0)
MCH: 30 pg (ref 26.0–34.0)
MCHC: 30.4 g/dL (ref 30.0–36.0)
MCV: 98.6 fL (ref 80.0–100.0)
Platelets: 249 10*3/uL (ref 150–400)
RBC: 2.87 MIL/uL — ABNORMAL LOW (ref 3.87–5.11)
RDW: 15.8 % — ABNORMAL HIGH (ref 11.5–15.5)
WBC: 9.5 10*3/uL (ref 4.0–10.5)
nRBC: 0 % (ref 0.0–0.2)

## 2023-06-02 LAB — GLUCOSE, CAPILLARY
Glucose-Capillary: 106 mg/dL — ABNORMAL HIGH (ref 70–99)
Glucose-Capillary: 146 mg/dL — ABNORMAL HIGH (ref 70–99)
Glucose-Capillary: 200 mg/dL — ABNORMAL HIGH (ref 70–99)
Glucose-Capillary: 163 mg/dL — ABNORMAL HIGH (ref 70–99)

## 2023-06-02 LAB — RENAL FUNCTION PANEL
Albumin: 2 g/dL — ABNORMAL LOW (ref 3.5–5.0)
Anion gap: 15 (ref 5–15)
BUN: 39 mg/dL — ABNORMAL HIGH (ref 6–20)
CO2: 18 mmol/L — ABNORMAL LOW (ref 22–32)
Calcium: 8.3 mg/dL — ABNORMAL LOW (ref 8.9–10.3)
Chloride: 103 mmol/L (ref 98–111)
Creatinine, Ser: 7.65 mg/dL — ABNORMAL HIGH (ref 0.44–1.00)
GFR, Estimated: 6 mL/min — ABNORMAL LOW (ref >60)
Glucose, Bld: 116 mg/dL — ABNORMAL HIGH (ref 70–99)
Phosphorus: 10.5 mg/dL — ABNORMAL HIGH (ref 2.5–4.6)
Potassium: 4.1 mmol/L (ref 3.5–5.1)
Sodium: 136 mmol/L (ref 135–145)

## 2023-06-02 MED ORDER — HYDROMORPHONE HCL 1 MG/ML IJ SOLN
1.0000 mg | INTRAMUSCULAR | Status: DC | PRN
  Administered 2023-06-02 – 2023-06-04 (×14): 2 mg via INTRAVENOUS
  Administered 2023-06-04 (×2): 1 mg via INTRAVENOUS
  Administered 2023-06-04 – 2023-06-07 (×20): 2 mg via INTRAVENOUS
  Administered 2023-06-07: 1 mg via INTRAVENOUS
  Administered 2023-06-08 – 2023-06-09 (×9): 2 mg via INTRAVENOUS
  Administered 2023-06-09 (×2): 1 mg via INTRAVENOUS
  Administered 2023-06-09 – 2023-06-10 (×8): 2 mg via INTRAVENOUS
  Filled 2023-06-02 (×23): qty 2
  Filled 2023-06-02: qty 1
  Filled 2023-06-02 (×4): qty 2
  Filled 2023-06-02: qty 1
  Filled 2023-06-02: qty 2
  Filled 2023-06-02: qty 1
  Filled 2023-06-02 (×7): qty 2
  Filled 2023-06-02: qty 1
  Filled 2023-06-02: qty 2
  Filled 2023-06-02: qty 1
  Filled 2023-06-02 (×16): qty 2

## 2023-06-02 MED ORDER — ALTEPLASE 2 MG IJ SOLR
4.0000 mg | Freq: Once | INTRAMUSCULAR | Status: DC
  Filled 2023-06-02: qty 4

## 2023-06-02 MED ORDER — HYDROMORPHONE HCL 1 MG/ML IJ SOLN: 2.0000 mg | INTRAMUSCULAR | Status: DC | PRN

## 2023-06-02 NOTE — Plan of Care (Signed)
  Problem: Education: Goal: Ability to describe self-care measures that may prevent or decrease complications (Diabetes Survival Skills Education) will improve Outcome: Progressing   Problem: Coping: Goal: Ability to adjust to condition or change in health will improve Outcome: Progressing   Problem: Fluid Volume: Goal: Ability to maintain a balanced intake and output will improve Outcome: Progressing   Problem: Nutritional: Goal: Maintenance of adequate nutrition will improve Outcome: Progressing   Problem: Skin Integrity: Goal: Risk for impaired skin integrity will decrease Outcome: Progressing   Problem: Tissue Perfusion: Goal: Adequacy of tissue perfusion will improve Outcome: Progressing

## 2023-06-02 NOTE — Progress Notes (Signed)
 [facility]                                                                              [place], is a 58 y.o. female, DOB - Nov 08, 1965, JXB:[id number] Admit date - 05/29/2023    Outpatient Primary MD for the patient is Pcp, No  LOS - 3  days  Chief Complaint  Patient presents with   Chest Pain   Abdominal Pain       Brief summary   Patient is a 58 yrs old female with ESRD on HD TTS, gastroparesis, GERD, PAF not on AC, diastolic CHF with preserved EF 55 to 60%, gout, duodenal carcinoid tumor, PVD s/p right-sided BKA, history of gastric AVM, NASH, depression, anxiety, obesity, restrictive lung disease, spinal stenosis, chronic pain syndrome,T2DM, chronic left heel diabetic ulcer, and history of chronic diarrhea in the setting of carcinoid tumor who presented to ED with complaints of diarrhea in setting of C.Diff and abdominal pain.  Patient has missed hemodialysis because of having intractable nausea and vomiting.  Patient has been taking oral vancomycin for C. Difficile+.  CT A&P no acute findings.  Patient is admitted for fluid overload due to missed hemodialysis due to diarrhea and intractable nausea and vomiting.    Assessment & Plan    Principal Problem: Intractable Nausea/ Vomiting : Acute on chronic abdominal pain - Patient reports history of chronic abdominal pain and diarrhea, presented with worsening diarrhea, abdominal pain (She was recently treated for C. difficile at her SNF). -CT abdomen/pelvis : No acute intraabdominal abnormality found. -C. difficile negative inpatient, GI pathogen panel negative -Continue symptomatic management, pain control, antiemetics as needed -Missed HD x 2 sessions and was volume overloaded contributing to her nausea, nephrology following for HD. -Continue cholestyramine for diarrhea, patient refused cholestyramine yesterday.   Anemia of chronic disease: Baseline Hgb 8-9 Stable, continue to monitor   Chronic  diarrhea with history of C.Diff: She was recently diagnosed with C.Diff at SNF and treated.  -She has completed the course of vancomycin and Flagyl -Repeat C. difficile, GI pathogen panel negative -Placed on scheduled cholestyramine, patient refused yesterday however discussed with the patient today and she states that she will take it.  ESRD on hemodialysis, TTS -Nephrology following, continue HD per schedule   Acute on chronic diastolic CHF: Volume overloaded as she missed her dialysis x 2 -Volume management with HD   Chronic hypokalemia: Likely secondary to diarrhea -Replace as needed   Chronic pain syndrome/chronic abdominal pain: Continue IV Dilaudid as needed for pain. Continue home Robaxin as needed and gabapentin     Insulin dependent type 2 diabetes mellitus (HCC) Hb A1C of 5.7 in 01/2023 Continue sensitive SSI  CBG (last 3)  Recent Labs    06/01/23 1637 06/01/23 2102 06/02/23 0607  GLUCAP 120* 128* 106*   Anemia of chronic disease, ESRD -H&H stable, at baseline   Paroxysmal atrial fibrillation (HCC): HR controlled, continue metoprolol No longer on Eliquis due to GI bleeding in 2024 October.    Malignant carcinoid tumor of duodenum (HCC) S/p excision    GERD (gastroesophageal reflux disease) Continue PPI BID -- change to IV  while not tolerating PO    Chronic depression/anxiety Continue remeron, buspar and hydroxyzine    Chronic gout Continue allopurinol 100 mg twice daily.    Chronic wound of left heel: Wound care consult.   Obesity class II Estimated body mass index is 38.57 kg/m (pended) as calculated from the following:   Height as of this encounter: (P) 5\' 6"  (1.676 m).   Weight as of this encounter: 108.4 kg.  Code Status: Full code DVT Prophylaxis:  heparin injection 5,000 Units Start: 05/29/23 1400   Level of Care: Level of care: Telemetry Cardiac Family Communication: Updated patient Disposition Plan:      Remains inpatient  appropriate:   Hopefully DC back to SNF tomorrow   Procedures:  Hemodialysis  Consultants:   Nephrology  Antimicrobials:   Anti-infectives (From admission, onward)    None          Medications  (feeding supplement) PROSource Plus  30 mL Oral BID BM   acidophilus  1 capsule Oral TID WC   allopurinol  100 mg Oral BID   alteplase  4 mg Intracatheter Once   atorvastatin  10 mg Oral QPM   busPIRone  5 mg Oral TID   cholestyramine light  4 g Oral BID   famotidine  20 mg Oral QAC breakfast   ferric citrate  210 mg Oral TID WC   folic acid  1 mg Oral Daily   Gerhardt's butt cream   Topical BID   heparin  5,000 Units Subcutaneous Q8H   insulin aspart  0-6 Units Subcutaneous TID WC   metoprolol tartrate  25 mg Oral BID   midodrine  10 mg Oral Q T,Th,Sa-HD   mirtazapine  15 mg Oral QHS   pantoprazole  40 mg Oral BID   risperiDONE  1 mg Oral QHS      Subjective:   Deborah [name] was seen and examined today.  Seen during HD today, states pain is improving however not still good enough to go to SNF today.  Has diarrhea but refused cholestyramine yesterday.   No dizziness, chest pain, shortness of breath, fevers.  Has chronic abdominal pain but appeared comfortable.    Objective:   Vitals:   06/02/23 0033 06/02/23 0500 06/02/23 0609 06/02/23 0847  BP: (!) 114/58  111/69 119/64  Pulse: 88  95 99  Resp: 14  16 (!) 24  Temp: (!) 97.5 F (36.4 C)  97.7 F (36.5 C) 97.7 F (36.5 C)  TempSrc: Oral  Oral Oral  SpO2: 100%  100% 100%  Weight:  108.4 kg    Height:        Intake/Output Summary (Last 24 hours) at 06/02/2023 1217 Last data filed at 06/02/2023 0035 Gross per 24 hour  Intake 360 ml  Output --  Net 360 ml     Wt Readings from Last 3 Encounters:  06/02/23 108.4 kg  05/03/23 102.8 kg  03/22/23 113.4 kg    Physical Exam General: Alert and oriented x 3, NAD Cardiovascular: S1 S2 clear, RRR.  Respiratory: CTAB, no wheezing Gastrointestinal: Soft,  nontender, nondistended, NBS Ext: no pedal edema bilaterally Neuro: no new deficits Psych: Normal affect     Data Reviewed:  I have personally reviewed following labs    CBC Lab Results  Component Value Date   WBC 9.5 06/02/2023   RBC 2.87 (L) 06/02/2023   HGB 8.6 (L) 06/02/2023   HCT 28.3 (L) 06/02/2023   MCV 98.6 06/02/2023   MCH 30.0 06/02/2023  PLT 249 06/02/2023   MCHC 30.4 06/02/2023   RDW 15.8 (H) 06/02/2023   LYMPHSABS 2.6 05/29/2023   MONOABS 0.6 05/29/2023   EOSABS 0.3 05/29/2023   BASOSABS 0.1 05/29/2023     Last metabolic panel Lab Results  Component Value Date   NA 136 06/02/2023   K 4.1 06/02/2023   CL 103 06/02/2023   CO2 18 (L) 06/02/2023   BUN 39 (H) 06/02/2023   CREATININE 7.65 (H) 06/02/2023   GLUCOSE 116 (H) 06/02/2023   GFRNONAA 6 (L) 06/02/2023   GFRAA 49 (L) 11/30/2019   CALCIUM 8.3 (L) 06/02/2023   PHOS 10.5 (H) 06/02/2023   PROT 6.5 05/29/2023   ALBUMIN 2.0 (L) 06/02/2023   BILITOT 0.9 05/29/2023   ALKPHOS 101 05/29/2023   AST 14 (L) 05/29/2023   ALT 11 05/29/2023   ANIONGAP 15 06/02/2023    CBG (last 3)  Recent Labs    06/01/23 1637 06/01/23 2102 06/02/23 0607  GLUCAP 120* 128* 106*      Coagulation Profile: No results for input(s): "INR", "PROTIME" in the last 168 hours.   Radiology Studies: I have personally reviewed the imaging studies  No results found.     Thad Ranger M.D. [facility] 06/02/2023, 12:17 PM  Available via Epic secure chat 7am-7pm After 7 pm, please refer to night coverage provider listed on amion.

## 2023-06-02 NOTE — Procedures (Signed)
 I was present at this dialysis session. I have reviewed the session itself and made appropriate changes.   Vital signs in last 24 hours:  Temp:  [97.5 F (36.4 C)-99.2 F (37.3 C)] 97.7 F (36.5 C) (03/27 0847) Pulse Rate:  [88-103] 99 (03/27 0847) Resp:  [14-24] 24 (03/27 0847) BP: (111-121)/(58-74) 119/64 (03/27 0847) SpO2:  [100 %] 100 % (03/27 0847) Weight:  [108.4 kg] 108.4 kg (03/27 0500) Weight change:  Filed Weights   05/29/23 1451 05/31/23 0500 06/02/23 0500  Weight: (P) 104.8 kg 111.6 kg 108.4 kg    Recent Labs  Lab 06/02/23 0710  NA 136  K 4.1  CL 103  CO2 18*  GLUCOSE 116*  BUN 39*  CREATININE 7.65*  CALCIUM 8.3*  PHOS 10.5*    Recent Labs  Lab 05/29/23 0301 05/30/23 0316 05/31/23 1154 06/02/23 0710  WBC 10.8* 11.8* 11.0* 9.5  NEUTROABS 7.2  --   --   --   HGB 9.5* 8.5* 8.3* 8.6*  HCT 32.1* 28.3* 27.0* 28.3*  MCV 100.0 100.4* 98.2 98.6  PLT 362 304 259 249    Scheduled Meds:  (feeding supplement) PROSource Plus  30 mL Oral BID BM   acidophilus  1 capsule Oral TID WC   allopurinol  100 mg Oral BID   atorvastatin  10 mg Oral QPM   busPIRone  5 mg Oral TID   cholestyramine light  4 g Oral BID   famotidine  20 mg Oral QAC breakfast   ferric citrate  210 mg Oral TID WC   folic acid  1 mg Oral Daily   Gerhardt's butt cream   Topical BID   heparin  5,000 Units Subcutaneous Q8H   insulin aspart  0-6 Units Subcutaneous TID WC   metoprolol tartrate  25 mg Oral BID   midodrine  10 mg Oral Q T,Th,Sa-HD   mirtazapine  15 mg Oral QHS   pantoprazole  40 mg Oral BID   risperiDONE  1 mg Oral QHS   Continuous Infusions: PRN Meds:.HYDROmorphone (DILAUDID) injection, hydrOXYzine, loperamide, methocarbamol, ondansetron (ZOFRAN) IV   Irena Cords,  MD 06/02/2023, 9:15 AM

## 2023-06-02 NOTE — TOC Progression Note (Signed)
 Transition of Care [facility]) - Progression Note    Patient Details  Name: Deborah [name] MRN: [id number] Date of Birth: 04-26-1965  Transition of [facility]) CM/SW Contact  Michaela Corner, [place] Phone Number: 06/02/2023, 3:53 PM  Clinical Narrative:   CSW followed up with patient regarding decision on SNF bed choice. Patient is wanting to go to long term care, patient inquired about [facility] and [facility]. CSW explained that PT rec STR and that the SNF can help patient transition to LTC. Patient states that she needs to think about it. CSW will continue to follow up with patient about bed choice.   Marylene Land with [facility], stated that [facility] and [facility] can accept patient for SNF and assist in transitioning her to LTC.   CSW confirmed that [facility] has LTC beds but that patient will need to turn over SSI check. CSW will follow up with patient and notify her.   TOC will continue to follow.    Expected Discharge Plan: Skilled Nursing Facility Barriers to Discharge: Continued Medical Work up, Other (must enter comment) (Awaiting PT/OT to see)  Expected Discharge Plan and Services In-house Referral: Clinical Social Work     Living arrangements for the past 2 months: Skilled Holiday representative                   DME Agency: NA                   Social Determinants of Health (SDOH) Interventions SDOH Screenings   Food Insecurity: No Food Insecurity (05/29/2023)  Housing: Patient Declined (05/29/2023)  Recent Concern: Housing - High Risk (04/25/2023)  Transportation Needs: Unmet Transportation Needs (05/29/2023)  Utilities: Not At Risk (05/29/2023)  Depression (PHQ2-9): Medium Risk (03/16/2023)  Financial Resource Strain: Low Risk (03/23/2021)   Received [facility])  Physical Activity: Not on File (10/31/2017)   Received from [place], [place]  Social Connections: Unknown (07/19/2021)   Received from [facility], [facility]  Stress: Low Risk (03/23/2021)   Received  from [facility])  Tobacco Use: Low Risk  (05/29/2023)    Readmission Risk Interventions    03/07/2023    4:19 PM 11/25/2022    5:16 PM 07/05/2022    1:20 PM  Readmission Risk Prevention Plan  Transportation Screening Complete Complete Complete  Medication Review (RN Care Manager) Complete Complete Referral to Pharmacy  PCP or Specialist appointment within 3-5 days of discharge Complete Complete Complete  HRI or Home Care Consult Complete Complete Complete  SW Recovery Care/Counseling Consult Complete Complete Complete  Palliative Care Screening Complete Not Applicable Not Applicable  Skilled Nursing Facility Not Applicable Not Applicable Not Applicable

## 2023-06-03 DIAGNOSIS — K529 Noninfective gastroenteritis and colitis, unspecified: Secondary | ICD-10-CM | POA: Diagnosis not present

## 2023-06-03 DIAGNOSIS — R112 Nausea with vomiting, unspecified: Secondary | ICD-10-CM | POA: Diagnosis not present

## 2023-06-03 DIAGNOSIS — R109 Unspecified abdominal pain: Secondary | ICD-10-CM | POA: Diagnosis not present

## 2023-06-03 DIAGNOSIS — D638 Anemia in other chronic diseases classified elsewhere: Secondary | ICD-10-CM | POA: Diagnosis not present

## 2023-06-03 DIAGNOSIS — D631 Anemia in chronic kidney disease: Secondary | ICD-10-CM

## 2023-06-03 DIAGNOSIS — I5033 Acute on chronic diastolic (congestive) heart failure: Secondary | ICD-10-CM | POA: Diagnosis not present

## 2023-06-03 DIAGNOSIS — R110 Nausea: Secondary | ICD-10-CM | POA: Diagnosis not present

## 2023-06-03 DIAGNOSIS — I480 Paroxysmal atrial fibrillation: Secondary | ICD-10-CM | POA: Diagnosis not present

## 2023-06-03 LAB — RENAL FUNCTION PANEL
Albumin: 1.9 g/dL — ABNORMAL LOW (ref 3.5–5.0)
Anion gap: 11 (ref 5–15)
BUN: 22 mg/dL — ABNORMAL HIGH (ref 6–20)
CO2: 23 mmol/L (ref 22–32)
Calcium: 8.1 mg/dL — ABNORMAL LOW (ref 8.9–10.3)
Chloride: 102 mmol/L (ref 98–111)
Creatinine, Ser: 5.29 mg/dL — ABNORMAL HIGH (ref 0.44–1.00)
GFR, Estimated: 9 mL/min — ABNORMAL LOW (ref >60)
Glucose, Bld: 122 mg/dL — ABNORMAL HIGH (ref 70–99)
Phosphorus: 7.6 mg/dL — ABNORMAL HIGH (ref 2.5–4.6)
Potassium: 3.8 mmol/L (ref 3.5–5.1)
Sodium: 136 mmol/L (ref 135–145)

## 2023-06-03 LAB — GLUCOSE, CAPILLARY
Glucose-Capillary: 150 mg/dL — ABNORMAL HIGH (ref 70–99)
Glucose-Capillary: 166 mg/dL — ABNORMAL HIGH (ref 70–99)
Glucose-Capillary: 115 mg/dL — ABNORMAL HIGH (ref 70–99)
Glucose-Capillary: 156 mg/dL — ABNORMAL HIGH (ref 70–99)

## 2023-06-03 LAB — AMYLASE: Amylase: 64 U/L (ref 28–100)

## 2023-06-03 LAB — CBC
HCT: 27.3 % — ABNORMAL LOW (ref 36.0–46.0)
Hemoglobin: 8.2 g/dL — ABNORMAL LOW (ref 12.0–15.0)
MCH: 29.9 pg (ref 26.0–34.0)
MCHC: 30 g/dL (ref 30.0–36.0)
MCV: 99.6 fL (ref 80.0–100.0)
Platelets: 235 10*3/uL (ref 150–400)
RBC: 2.74 MIL/uL — ABNORMAL LOW (ref 3.87–5.11)
RDW: 15.6 % — ABNORMAL HIGH (ref 11.5–15.5)
WBC: 9.2 10*3/uL (ref 4.0–10.5)
nRBC: 0 % (ref 0.0–0.2)

## 2023-06-03 LAB — LIPASE, BLOOD: Lipase: 24 U/L (ref 11–51)

## 2023-06-03 NOTE — Progress Notes (Signed)
 Patient ID: Deborah [name], female   DOB: [birth year], 58 y.o.   MRN: [id number] S: complaining of abdominal pain this morning O:BP 96/72 (BP Location: Right Wrist)   Pulse 98   Temp (!) 97.5 F (36.4 C) (Oral)   Resp 17   Ht (P) 5\' 6"  (1.676 m)   Wt 108.4 kg   LMP 10/10/2012   SpO2 96%   BMI (P) 38.57 kg/m   Intake/Output Summary (Last 24 hours) at 06/03/2023 0928 Last data filed at 06/03/2023 0454 Gross per 24 hour  Intake 480 ml  Output --  Net 480 ml   Intake/Output: I/O last 3 completed shifts: In: 840 [P.O.:840] Out: -   Intake/Output this shift:  No intake/output data recorded. Weight change:  Gen: NAD CVS: RRR Resp: CTA  Abd:+BS, soft, mildly tender Ext: s/p RBKA, LLE with trace edema and wrapping in place.  Recent Labs  Lab 05/29/23 0301 05/30/23 0316 05/31/23 1154 06/02/23 0710 06/03/23 0243  NA 137 139 136 136 136  K 3.2* 3.6 3.5 4.1 3.8  CL 107 112* 103 103 102  CO2 17* 16* 23 18* 23  GLUCOSE 216* 124* 144* 116* 122*  BUN 35* 38* 23* 39* 22*  CREATININE 8.22* 8.66* 5.37* 7.65* 5.29*  ALBUMIN 2.2*  --   --  2.0* 1.9*  CALCIUM 8.3* 8.3* 7.9* 8.3* 8.1*  PHOS  --   --  6.1* 10.5* 7.6*  AST 14*  --   --   --   --   ALT 11  --   --   --   --    Liver Function Tests: Recent Labs  Lab 05/29/23 0301 06/02/23 0710 06/03/23 0243  AST 14*  --   --   ALT 11  --   --   ALKPHOS 101  --   --   BILITOT 0.9  --   --   PROT 6.5  --   --   ALBUMIN 2.2* 2.0* 1.9*   Recent Labs  Lab 05/29/23 0301  LIPASE 43   No results for input(s): "AMMONIA" in the last 168 hours. CBC: Recent Labs  Lab 05/29/23 0301 05/30/23 0316 05/31/23 1154 06/02/23 0710 06/03/23 0243  WBC 10.8* 11.8* 11.0* 9.5 9.2  NEUTROABS 7.2  --   --   --   --   HGB 9.5* 8.5* 8.3* 8.6* 8.2*  HCT 32.1* 28.3* 27.0* 28.3* 27.3*  MCV 100.0 100.4* 98.2 98.6 99.6  PLT 362 304 259 249 235   Cardiac Enzymes: No results for input(s): "CKTOTAL", "CKMB", "CKMBINDEX", "TROPONINI" in the  last 168 hours. CBG: Recent Labs  Lab 06/02/23 0607 06/02/23 1435 06/02/23 1707 06/02/23 2103 06/03/23 0535  GLUCAP 106* 146* 200* 163* 150*    Iron Studies: No results for input(s): "IRON", "TIBC", "TRANSFERRIN", "FERRITIN" in the last 72 hours. Studies/Results: No results found.  (feeding supplement) PROSource Plus  30 mL Oral BID BM   acidophilus  1 capsule Oral TID WC   allopurinol  100 mg Oral BID   atorvastatin  10 mg Oral QPM   busPIRone  5 mg Oral TID   cholestyramine light  4 g Oral BID   famotidine  20 mg Oral QAC breakfast   ferric citrate  210 mg Oral TID WC   folic acid  1 mg Oral Daily   Gerhardt's butt cream   Topical BID   heparin  5,000 Units Subcutaneous Q8H   insulin aspart  0-6 Units Subcutaneous TID WC  metoprolol tartrate  25 mg Oral BID   midodrine  10 mg Oral Q T,Th,Sa-HD   mirtazapine  15 mg Oral QHS   pantoprazole  40 mg Oral BID   risperiDONE  1 mg Oral QHS    BMET    Component Value Date/Time   NA 136 06/03/2023 0243   NA 137 09/20/2019 1530   K 3.8 06/03/2023 0243   CL 102 06/03/2023 0243   CO2 23 06/03/2023 0243   GLUCOSE 122 (H) 06/03/2023 0243   BUN 22 (H) 06/03/2023 0243   BUN 18 09/20/2019 1530   CREATININE 5.29 (H) 06/03/2023 0243   CALCIUM 8.1 (L) 06/03/2023 0243   GFRNONAA 9 (L) 06/03/2023 0243   GFRAA 49 (L) 11/30/2019 0356   CBC    Component Value Date/Time   WBC 9.2 06/03/2023 0243   RBC 2.74 (L) 06/03/2023 0243   HGB 8.2 (L) 06/03/2023 0243   HGB 8.8 (L) 10/09/2019 0906   HGB 9.6 (L) 09/03/2019 1620   HCT 27.3 (L) 06/03/2023 0243   HCT 30.4 (L) 09/03/2019 1620   PLT 235 06/03/2023 0243   PLT 348 10/09/2019 0906   PLT 451 (H) 09/03/2019 1620   MCV 99.6 06/03/2023 0243   MCV 85 09/03/2019 1620   MCH 29.9 06/03/2023 0243   MCHC 30.0 06/03/2023 0243   RDW 15.6 (H) 06/03/2023 0243   RDW 13.6 09/03/2019 1620   LYMPHSABS 2.6 05/29/2023 0301   LYMPHSABS 2.2 09/03/2019 1620   MONOABS 0.6 05/29/2023 0301   EOSABS  0.3 05/29/2023 0301   EOSABS 0.3 09/03/2019 1620   BASOSABS 0.1 05/29/2023 0301   BASOSABS 0.1 09/03/2019 1620    OP HD: [facility] [place] 4h   B400 101.5 kg 3K bath TDC   heparin none last HD 3/1 9, post weight 105.5 kg Not getting to dry weight, 4-8 kg over Mircera 225 mcg every 2 weeks Hectorol 4 mcg 3 times weekly   Assessment/ Plan: Chronic diarrhea / hx of Cdif, taking po vanc. Per pmd ESRD: on HD TTS.  Completed HD yesterday without UF due to low bp and needed IVF to support bp.  Plan for HD tomorrow to keep on her schedule. Vascular access - issues with [facility] yesterday with low BFR and high pressures.  Tpa to dwell until next HD session tomorrow.   BP: chronic hypotension on midodrine at home. Cont midodrine.  Volume: she is above edw, however bp dropping with UF due to diarrhea. Anemia of esrd: Hb 9-10 , follow.  Secondary hyperparathyroidism: CCa in range, cont binders ac.  Irena Cords, MD [facility]

## 2023-06-03 NOTE — Care Management Important Message (Signed)
 Important Message  Patient Details  Name: Deborah [name] MRN: [id number] Date of Birth: 1965/04/14   Important Message Given:        [name] 06/03/2023, 2:30 PM

## 2023-06-03 NOTE — Progress Notes (Signed)
 [facility]                                                                              [place], is a 58 y.o. female, DOB - [birth year], GEX:[id number] Admit date - 05/29/2023    Outpatient Primary MD for the patient is Pcp, No  LOS - 4  days  Chief Complaint  Patient presents with   Chest Pain   Abdominal Pain       Brief summary   Patient is a 58 yrs old female with ESRD on HD TTS, gastroparesis, GERD, PAF not on AC, diastolic CHF with preserved EF 55 to 60%, gout, duodenal carcinoid tumor, PVD s/p right-sided BKA, history of gastric AVM, NASH, depression, anxiety, obesity, restrictive lung disease, spinal stenosis, chronic pain syndrome,T2DM, chronic left heel diabetic ulcer, and history of chronic diarrhea in the setting of carcinoid tumor who presented to ED with complaints of diarrhea in setting of C.Diff and abdominal pain.  Patient has missed hemodialysis because of having intractable nausea and vomiting.  Patient has been taking oral vancomycin for C. Difficile+.  CT A&P no acute findings.  Patient is admitted for fluid overload due to missed hemodialysis due to diarrhea and intractable nausea and vomiting.    Assessment & Plan    Principal Problem: Intractable Nausea/ Vomiting : Acute on chronic abdominal pain, acute on chronic diarrhea with history of C. difficile - Patient reports history of chronic abdominal pain and diarrhea, presented with worsening diarrhea, abdominal pain (She was recently treated for C. difficile at her SNF). -CT abdomen/pelvis : No acute intraabdominal abnormality found. -C. difficile negative inpatient, GI pathogen panel negative -Continue symptomatic management, pain control, antiemetics as needed -Missed HD x 2 sessions and was volume overloaded contributing to her nausea, nephrology following for HD. -Continue cholestyramine for diarrhea, patient has been intermittently refusing cholestyramine, took 1 dose only  yesterday -Previous CT scans did not show pancreatic issues.  Amylase 64, lipase 24 -Previous EGD in 04/2023 had shown gastritis, duodenitis, requested GI consult -Continue Protonix 40 mg twice daily   Anemia of chronic disease: Baseline Hgb 8-9 Stable, continue to monitor .  ESRD on hemodialysis, TTS -Nephrology following, continue HD per schedule   Acute on chronic diastolic CHF: Volume overloaded as she missed her dialysis x 2 -Volume management with HD   Chronic hypokalemia: Likely secondary to diarrhea -Replace as needed   Chronic pain syndrome/chronic abdominal pain: Continue IV Dilaudid as needed for pain. Continue home Robaxin as needed and gabapentin     Insulin dependent type 2 diabetes mellitus (HCC) Hb A1C of 5.7 in 01/2023 Continue sensitive SSI  CBG (last 3)  Recent Labs    06/02/23 2103 06/03/23 0535 06/03/23 1113  GLUCAP 163* 150* 166*   Anemia of chronic disease, ESRD -H&H stable, at baseline   Paroxysmal atrial fibrillation (HCC): HR controlled, continue metoprolol No longer on Eliquis due to GI bleeding in 2024 October.    Malignant carcinoid tumor of duodenum (HCC) S/p excision    GERD (gastroesophageal reflux disease) Continue PPI BID   Chronic depression/anxiety Continue remeron, buspar and hydroxyzine    Chronic  gout Continue allopurinol 100 mg twice daily.    Chronic wound of left heel: Wound care consult.   Obesity class II Estimated body mass index is 38.57 kg/m (pended) as calculated from the following:   Height as of this encounter: (P) 5\' 6"  (1.676 m).   Weight as of this encounter: 108.4 kg.  Code Status: Full code DVT Prophylaxis:  heparin injection 5,000 Units Start: 05/29/23 1400   Level of Care: Level of care: Telemetry Cardiac Family Communication: Updated patient Disposition Plan:      Remains inpatient appropriate: Awaiting SNF   Procedures:  Hemodialysis  Consultants:    Nephrology GI  Antimicrobials:   Anti-infectives (From admission, onward)    None          Medications  (feeding supplement) PROSource Plus  30 mL Oral BID BM   acidophilus  1 capsule Oral TID WC   allopurinol  100 mg Oral BID   atorvastatin  10 mg Oral QPM   busPIRone  5 mg Oral TID   cholestyramine light  4 g Oral BID   famotidine  20 mg Oral QAC breakfast   ferric citrate  210 mg Oral TID WC   folic acid  1 mg Oral Daily   Gerhardt's butt cream   Topical BID   heparin  5,000 Units Subcutaneous Q8H   insulin aspart  0-6 Units Subcutaneous TID WC   metoprolol tartrate  25 mg Oral BID   midodrine  10 mg Oral Q T,Th,Sa-HD   mirtazapine  15 mg Oral QHS   pantoprazole  40 mg Oral BID   risperiDONE  1 mg Oral QHS      Subjective:   Deborah [name] was seen and examined today.  Sitting upright in the bed, continues to complain of abdominal pain and diarrhea however also has been refusing cholestyramine.  No chest pain, fever chills, no active vomiting.  Objective:   Vitals:   06/02/23 2343 06/03/23 0450 06/03/23 0717 06/03/23 1114  BP: (!) 91/47 90/63 96/72    Pulse: 98   95  Resp: 18 15 17    Temp: 97.8 F (36.6 C) 98.3 F (36.8 C) (!) 97.5 F (36.4 C)   TempSrc: Oral Oral Oral   SpO2: 98% 96%  97%  Weight:      Height:        Intake/Output Summary (Last 24 hours) at 06/03/2023 1255 Last data filed at 06/03/2023 0454 Gross per 24 hour  Intake 480 ml  Output --  Net 480 ml     Wt Readings from Last 3 Encounters:  06/02/23 108.4 kg  05/03/23 102.8 kg  03/22/23 113.4 kg   Physical Exam General: Alert and oriented x 3, NAD Cardiovascular: S1 S2 clear, RRR.  Respiratory: CTAB, no wheezing, rales or rhonchi Gastrointestinal: Soft, mild diffuse TTP, ND, NBS  Ext: no pedal edema bilaterally Neuro: no new deficits Psych: Normal affect     Data Reviewed:  I have personally reviewed following labs    CBC Lab Results  Component Value Date    WBC 9.2 06/03/2023   RBC 2.74 (L) 06/03/2023   HGB 8.2 (L) 06/03/2023   HCT 27.3 (L) 06/03/2023   MCV 99.6 06/03/2023   MCH 29.9 06/03/2023   PLT 235 06/03/2023   MCHC 30.0 06/03/2023   RDW 15.6 (H) 06/03/2023   LYMPHSABS 2.6 05/29/2023   MONOABS 0.6 05/29/2023   EOSABS 0.3 05/29/2023   BASOSABS 0.1 05/29/2023     Last metabolic panel Lab Results  Component Value Date   NA 136 06/03/2023   K 3.8 06/03/2023   CL 102 06/03/2023   CO2 23 06/03/2023   BUN 22 (H) 06/03/2023   CREATININE 5.29 (H) 06/03/2023   GLUCOSE 122 (H) 06/03/2023   GFRNONAA 9 (L) 06/03/2023   GFRAA 49 (L) 11/30/2019   CALCIUM 8.1 (L) 06/03/2023   PHOS 7.6 (H) 06/03/2023   PROT 6.5 05/29/2023   ALBUMIN 1.9 (L) 06/03/2023   BILITOT 0.9 05/29/2023   ALKPHOS 101 05/29/2023   AST 14 (L) 05/29/2023   ALT 11 05/29/2023   ANIONGAP 11 06/03/2023    CBG (last 3)  Recent Labs    06/02/23 2103 06/03/23 0535 06/03/23 1113  GLUCAP 163* 150* 166*      Coagulation Profile: No results for input(s): "INR", "PROTIME" in the last 168 hours.   Radiology Studies: I have personally reviewed the imaging studies  No results found.     Thad Ranger M.D. [facility] 06/03/2023, 12:55 PM  Available via Epic secure chat 7am-7pm After 7 pm, please refer to night coverage provider listed on amion.

## 2023-06-03 NOTE — Progress Notes (Signed)
 Physical Therapy Treatment Patient Details Name: Deborah [name] MRN: [id number] DOB: 12-15-1965 Today's Date: 06/03/2023   History of Present Illness 58 y/o female presents to [facility] on 05/29/23 with resports of chest pain, abdominal pain, and diahrrea in setting of C-diff. Missed 2 dialysis sessions, fluid overloaded.  PMHx: ESRD on dialysis, gastroparesis, GERD, anemia of chronic disease, PAF, diastolic heart failure with preserved EF 55 to 60%, gout, duodenal carcinoid tumor, PVD s/p R BKA, history of gastric AVM, NASH, reactive dyskinesia, depression, anxiety, obesity, restrictive lung disease, spinal stenosis, chronic pain syndrome, insulin-dependent DM type II, chronic left heel diabetic ulcer, and history of chronic diarrhea 2/2 carcinoid tumor.    PT Comments  Pt greeted seated EOB, agreeable to exercises only this session. She requires increased time to initiate task and max encouragement to stay engage during session. Pt demonstrated good seated balance. She was able to reach towards the floor without UE support and had no LOB. Pt was able to achieve full knee extension on RLE without assistance. She tolerated up to ~45deg of knee flexion on LLE with assistance, unable to reach 0deg d/t pain. Pt displayed intermittent posterior lean that she was able to self-correct with cueing. Will continue to follow acutely and advance appropriately.     If plan is discharge home, recommend the following: A lot of help with walking and/or transfers;A lot of help with bathing/dressing/bathroom;Assistance with cooking/housework;Direct supervision/assist for medications management;Direct supervision/assist for financial management;Assist for transportation;Help with stairs or ramp for entrance   Can travel by private vehicle     No  Equipment Recommendations  None recommended by PT    Recommendations for Other Services       Precautions / Restrictions Precautions Precautions: Fall Recall of  Precautions/Restrictions: Impaired Precaution/Restrictions Comments: R BKA Restrictions Weight Bearing Restrictions Per Provider Order: No     Mobility  Bed Mobility               General bed mobility comments: Not assessed. Pt greeted seated EOB at start of session and remained there at end of session.    Transfers                   General transfer comment: Pt refused to transfer with maximove present citing fatigue and reporting the chair is uncomfortable and she intends to lay back down and sleep.    Ambulation/Gait                   Stairs             Wheelchair Mobility     Tilt Bed    Modified Rankin (Stroke Patients Only)       Balance Overall balance assessment: Needs assistance Sitting-balance support: No upper extremity supported, Feet supported Sitting balance-Leahy Scale: Good Sitting balance - Comments: Pt sat EOB with supervision. She was able to reach down towards the ground without LOB. Pt demonstrated inability to reach forward to tray table and pick up a beverage to drink mid-session. She reports she is able to feed herself at baseline depending on her level of fatigue. Pt demonstrated posterior lean prn during tasks and required VC/TC to regain neutral posture. Pt recieved a phone call towards the end of the session and was able to reach behind her with LUE to pick up phone and hold while talking to a family member on speaker. Postural control: Posterior lean (Intermittent)  Communication Communication Communication: No apparent difficulties  Cognition Arousal: Alert Behavior During Therapy: Flat affect   PT - Cognitive impairments: No family/caregiver present to determine baseline, Initiation, Sequencing, Attention                       PT - Cognition Comments: Pt requires max encouragement to participate in session. She demonstrated delayed processing and required  re-direction to focus on task. Pt appears to be self-limiting and has demonstrated inconsistent BUE, BLE, and trunk performance in terms of ROM/strength. Following commands: Impaired Following commands impaired: Follows one step commands with increased time    Cueing Cueing Techniques: Verbal cues, Gestural cues  Exercises General Exercises - Lower Extremity Quad Sets: Seated, Both, 5 reps, Strengthening Gluteal Sets: Seated, Both, 5 reps, Strengthening Long Arc Quad: Both, Seated, 5 reps, AAROM, Limitations Long Arc Quad Limitations: Pt unable to achieve full knee extension d/t pain on LLE. Other Exercises Other Exercises: Seated diagonal forward reaches tracing down the leg towards the floor without UE support x8 reps ea side. Other Exercises: Seated shoulder rolls B x8 reps. (Attempted to educated pt on shoulder squeezes, but she was unable to understand despite multi-modal cueing.)    General Comments General comments (skin integrity, edema, etc.): VSS on RA.      Pertinent Vitals/Pain Pain Assessment Pain Assessment: Faces Faces Pain Scale: Hurts little more Pain Location: Generalized Pain Descriptors / Indicators: Aching, Discomfort, Sore Pain Intervention(s): Monitored during session    Home Living                          Prior Function            PT Goals (current goals can now be found in the care plan section) Acute Rehab PT Goals Patient Stated Goal: Decrease pain and return to rehab Progress towards PT goals: Progressing toward goals    Frequency    Min 1X/week      PT Plan      Co-evaluation              AM-PAC PT "6 Clicks" Mobility   Outcome Measure  Help needed turning from your back to your side while in a flat bed without using bedrails?: A Lot Help needed moving from lying on your back to sitting on the side of a flat bed without using bedrails?: A Lot Help needed moving to and from a bed to a chair (including a wheelchair)?:  Total Help needed standing up from a chair using your arms (e.g., wheelchair or bedside chair)?: Total Help needed to walk in hospital room?: Total Help needed climbing 3-5 steps with a railing? : Total 6 Click Score: 8    End of Session   Activity Tolerance: Patient tolerated treatment well;Patient limited by pain Patient left: in bed;with call bell/phone within reach;with bed alarm set Nurse Communication: Mobility status;Need for lift equipment PT Visit Diagnosis: Muscle weakness (generalized) (M62.81);Pain Pain - Right/Left: Left Pain - part of body: Leg;Ankle and joints of foot;Knee     Time: 3244-0102 PT Time Calculation (min) (ACUTE ONLY): 25 min  Charges:    $Therapeutic Exercise: 23-37 mins PT General Charges $$ ACUTE PT VISIT: 1 Visit                     Cheri Guppy, PT, DPT Acute Rehabilitation Services Office: [phone] Secure Chat Preferred  Richardson Chiquito 06/03/2023, 1:21 PM

## 2023-06-03 NOTE — Consult Note (Addendum)
 Attending physician's note   I have taken a history, reviewed the chart, and examined the patient. I performed a substantive portion of this encounter, including complete performance of at least one of the key components, in conjunction with the APP. I agree with the APP's note, impression, and recommendations with my edits.   58 year old female with medical history as outlined below, to include history of ESRD on HD, gastroparesis, A-fib (not on anticoagulation due to history of GI bleed), duodenal carcinoid s/p resection, and recent C. difficile, presenting with nausea/vomiting, diarrhea, and abdominal pain.  CT was largely unremarkable.  Labs about baseline.  Repeat C. difficile negative.  Unclear if her nausea/vomiting is any worse than baseline related to her underlying gastroparesis.  Unable to use Reglan due to history of tardive dyskinesia and had issue tolerating erythromycin in the past as well EGD last month as inpatient largely unremarkable with normal esophagus, mild gastritis (path benign), mild duodenitis, but retained food in the stomach consistent with known history of Gastroparesis.  - Will trial Questran.  She states she is now amenable to trialing - With negative infectious workup here, okay to use Imodium - Antiemetics as prescribed - No plan for repeat endoscopic evaluation at this juncture - Dr. Elnoria Howard will assume her ongoing inpatient GI care through the weekend and will follow her peripherally unless new issues arise.  Please reach out to him if any additional questions or concerns.   [address] DO, FACG [phone] office          Referring Provider: [facility] Primary Care Physician:  Pcp, No Primary Gastroenterologist: [facility]  Reason for Consultation:  Nausea, vomiting, diarrhea, abdominal pain  HPI: Deborah [name] is a 58 y.o. female with ESRD on HD TTS, gastroparesis, GERD, PAF not on AC, diastolic CHF with preserved EF 55 to 60%, gout,  duodenal carcinoid tumor s/p resection, PVD s/p right-sided BKA, history of gastric AVM, NASH, depression, anxiety, obesity, restrictive lung disease, spinal stenosis, chronic pain syndrome,T2DM, chronic left heel diabetic ulcer, and history of chronic diarrhea who presented to ED with complaints of diarrhea in setting of C.Diff and abdominal pain.  Patient has missed hemodialysis because of having intractable nausea and vomiting.   Patient has been taking oral vancomycin for C. Difficile.  Cdiff negative here.  GI pathogen panel negative.  Hgb 8.2 grams which is about at her baseline.  WBC count normal, platelets are normal.  CT scan abdomen and pelvis without contrast on 05/29/2023 showed only fibroid uterus and calcified femoral artery atherosclerosis.  Says that her diarrhea is watery about 4-5 times a day.  Her abdominal pain is across her abdomen and goes up into her chest and feels like something is sitting on her chest, but says that it is getting better with the current pain regimen and would just like to continue on that and let it run its course.  Nausea and vomiting is ok, comes and goes.  Has not eaten much at all because she does not want to have diarrhea.  Is drinking.  Refused her order Lanetta Inch on a few occasions.  Imodium is ordered prn.  Is on a probiotic.  EGD 04/28/23 showed a normal esophagus that was dilated and biopsied.  She had retained gastric fluid and gastritis as well as nodular back have site in the duodenal bulb that was biopsied.  A. DUODENUM, BIOPSY:       Active duodenitis with foveolar metaplasia and Brunner gland  hyperplasia consistent  with chronic active duodenitis.       Negative for dysplasia or malignancy.   B. STOMACH, BIOPSY:       Gastric antral / oxyntic mucosa with chronic inactive gastritis.       Marked reactive epithelial changes with granulation tissue type  stroma suggestive of healed ulcer.       No H. pylori identified on HE stain.        Negative for intestinal metaplasia or dysplasia.   C. ESOPHAGUS, BIOPSY:       Esophageal squamous mucosa without significant diagnostic  alteration.      Negative for intraepithelial lymphocytes or eosinophils.   She is not able to be on Reglan due to side effect of tardive dyskinesia and cannot take erythromycin because she developed an elevated heart rate.  Was supposed to be weaning off narcotics and following a gastroparesis diet during last hospitalization.  Other GI PROCEDURES:   EGD 12/23/2022 as an inpatient by Dr. Barron Alvine:  - Normal upper third of esophagus and middle third of esophagus.  - LA Grade A esophagitis with no bleeding.  - Three bleeding angioectasias in the stomach. Treated with argon plasma coagulation (APC).  - Gastritis. Treated with argon plasma coagulation (APC).  - Normal examined duodenum.  - Successful completion of the Video Capsule Enteroscope placement.  - No specimens collected.  - Colonoscopy not performed today due to solid stools.    GIVENS SMALL BOWL CAPSULE ENDOSCOPY 12/23/2022:   EGD 07/26/2020:  - LA Grade A esophagitis with no bleeding. Suspected cause of black emesis.  - A large amount of food (residue) in the stomach.  - Retained food in the duodenum.   A. DUODENUM, POLYPECTOMY:  - Low grade neuroendocrine tumor (carcinoid tumor), broadly present at  multiple tissue edges.  - Lymphovascular invasion is identified.  - See comment.    EGD/EUS at [facility] in August 2021 where they did biopsies, but they terminated the procedure due to difficulty breathing.  They had her return a week later on 11/06/2019 for repeat procedure with general anesthesia.  Records are in Care Everywhere.     EGD 08/02/2019:  - Z-line regular, 36 cm from the incisors.  - Normal mucosa was found in the entire stomach.  - Non-bleeding duodenal ulcer with no stigmata of bleeding.  - A single duodenal polyp. Resected and retrieved. Clip was  placed.  - Normal second portion of the duodenum.   Colonoscopy 07/30/2019:  - Preparation of the colon was fair.  - The examined portion of the ileum was normal.  - Two 2 to 3 mm polyps in the descending colon. Biopsied.  - One 10 mm polyp in the proximal sigmoid colon, removed with a hot snare. Resected and retrieved.  - Internal hemorrhoids            Path report consistent with tubular adenomas               Past Medical History:  Diagnosis Date   Acute back pain with sciatica, left    Acute back pain with sciatica, right    Acute encephalopathy 05/29/2022   Acute osteomyelitis of right calcaneus (HCC) 12/21/2022   Anemia, unspecified    Atrial fibrillation with RVR (HCC)-resolved 09/09/2022   Atypical chest pain 09/10/2021   Cancer (HCC)    Carcinoid tumor of duodenum    Chest pain with normal coronary angiography 2019   Chronic a-fib (HCC) 09/09/2022   Chronic pain  Chronic systolic CHF (congestive heart failure) (HCC)    Dehiscence of amputation stump of right lower extremity (HCC) 01/29/2023   Diabetes mellitus    DKA (diabetic ketoacidosis) (HCC)    Drug-seeking behavior    21 hospitalizations and 14 CT a/p in 2 years for N/V and abdominal pain, demanding only IV dilaudid   Elevated troponin    chronic   Esophageal reflux    Fibromyalgia    Gastric ulcer    Gastroparesis    Gout    HCAP (healthcare-associated pneumonia) 06/19/2022   Hyperlipidemia    Hyperosmolar hyperglycemic state (HHS) (HCC) 05/11/2022   Hyperosmolar non-ketotic state due to type 2 diabetes mellitus (HCC) 05/11/2022   Hypertension    Hypomagnesemia    Lumbosacral stenosis    LVH (left ventricular hypertrophy)    Morbid obesity (HCC)    Nausea & vomiting 09/09/2022   NICM (nonischemic cardiomyopathy) (HCC)    PAF (paroxysmal atrial fibrillation) (HCC)    Sepsis (HCC) 11/23/2022   Stroke (HCC) 02/2011   Symptomatic anemia 12/14/2022   Thrombocytosis    Vitamin B12 deficiency anemia      Past Surgical History:  Procedure Laterality Date   ABDOMINAL AORTOGRAM W/LOWER EXTREMITY N/A 11/29/2022   Procedure: ABDOMINAL AORTOGRAM W/LOWER EXTREMITY;  Surgeon: Daria Pastures, MD;  Location: [facility];  Service: Cardiovascular;  Laterality: N/A;   AMPUTATION Right 12/29/2022   Procedure: RIGHT BELOW KNEE AMPUTATION;  Surgeon: Nadara Mustard, MD;  Location: [facility] OR;  Service: Orthopedics;  Laterality: Right;   AV FISTULA PLACEMENT Left 06/30/2022   Procedure: LEFT BRACHIOCEPHALIC ARTERIOVENOUS (AV) FISTULA CREATION;  Surgeon: Cephus Shelling, MD;  [facility];  Service: Vascular;  Laterality: Left;   BIOPSY  07/27/2019   Procedure: BIOPSY;  Surgeon: Vida Rigger, MD;  Location: [facility] ENDOSCOPY;  Service: Endoscopy;;   BIOPSY  07/30/2019   Procedure: BIOPSY;  Surgeon: Kathi Der, MD;  Location: [facility] ENDOSCOPY;  Service: Gastroenterology;;   BIOPSY  04/28/2023   Procedure: BIOPSY;  Surgeon: Imogene Burn, MD;  Location: [facility] ENDOSCOPY;  Service: Gastroenterology;;   CATARACT EXTRACTION  01/2014   CHOLECYSTECTOMY     COLONOSCOPY WITH PROPOFOL N/A 07/30/2019   Procedure: COLONOSCOPY WITH PROPOFOL;  Surgeon: Kathi Der, MD;  Location: [facility] ENDOSCOPY;  Service: Gastroenterology;  Laterality: N/A;   ESOPHAGOGASTRODUODENOSCOPY N/A 07/27/2019   Procedure: ESOPHAGOGASTRODUODENOSCOPY (EGD);  Surgeon: Vida Rigger, MD;  Location: [facility];  Service: Endoscopy;  Laterality: N/A;   ESOPHAGOGASTRODUODENOSCOPY N/A 07/26/2020   Procedure: ESOPHAGOGASTRODUODENOSCOPY (EGD);  Surgeon: Willis Modena, MD;  Location: [facility] ENDOSCOPY;  Service: Endoscopy;  Laterality: N/A;   ESOPHAGOGASTRODUODENOSCOPY (EGD) WITH PROPOFOL N/A 08/02/2019   Procedure: ESOPHAGOGASTRODUODENOSCOPY (EGD) WITH PROPOFOL;  Surgeon: Kathi Der, MD;  Location: [facility] ENDOSCOPY;  Service: Gastroenterology;  Laterality: N/A;   ESOPHAGOGASTRODUODENOSCOPY (EGD) WITH PROPOFOL N/A 12/23/2022   Procedure:  ESOPHAGOGASTRODUODENOSCOPY (EGD) WITH PROPOFOL;  Surgeon: Shellia Cleverly, DO;  Location: [facility] ENDOSCOPY;  Service: Gastroenterology;  Laterality: N/A;   ESOPHAGOGASTRODUODENOSCOPY (EGD) WITH PROPOFOL N/A 04/28/2023   Procedure: ESOPHAGOGASTRODUODENOSCOPY (EGD) WITH PROPOFOL;  Surgeon: Imogene Burn, MD;  Location: [facility] ENDOSCOPY;  Service: Gastroenterology;  Laterality: N/A;   FISTULA SUPERFICIALIZATION Left 12/31/2022   Procedure: LEFT ARM FISTULA TRANSPOSITION;  Surgeon: Maeola Harman, MD;  Location: [facility] OR;  Service: Vascular;  Laterality: Left;   GIVENS CAPSULE STUDY N/A 12/23/2022   Procedure: GIVENS CAPSULE STUDY;  Surgeon: Shellia Cleverly, DO;  Location: [facility] ENDOSCOPY;  Service: Gastroenterology;  Laterality: N/A;  HEMOSTASIS CLIP PLACEMENT  08/02/2019   Procedure: HEMOSTASIS CLIP PLACEMENT;  Surgeon: Kathi Der, MD;  Location: [facility] ENDOSCOPY;  Service: Gastroenterology;;   HOT HEMOSTASIS N/A 12/23/2022   Procedure: HOT HEMOSTASIS (ARGON PLASMA COAGULATION/BICAP);  Surgeon: Shellia Cleverly, DO;  Location: [facility] ENDOSCOPY;  Service: Gastroenterology;  Laterality: N/A;   IR FLUORO GUIDE CV LINE RIGHT  06/24/2022   IR US GUIDE VASC ACCESS RIGHT  06/24/2022   POLYPECTOMY  07/30/2019   Procedure: POLYPECTOMY;  Surgeon: Kathi Der, MD;  Location: [facility] ENDOSCOPY;  Service: Gastroenterology;;   POLYPECTOMY  08/02/2019   Procedure: POLYPECTOMY;  Surgeon: Kathi Der, MD;  Location: [facility] ENDOSCOPY;  Service: Gastroenterology;;   Gaspar Bidding DILATION N/A 04/28/2023   Procedure: Gaspar Bidding DILATION;  Surgeon: Imogene Burn, MD;  Location: [facility] ENDOSCOPY;  Service: Gastroenterology;  Laterality: N/A;   STUMP REVISION Right 02/02/2023   Procedure: REVISION RIGHT BELOW KNEE AMPUTATION;  Surgeon: Nadara Mustard, MD;  Location: [facility] OR;  Service: Orthopedics;  Laterality: Right;    Prior to Admission medications   Medication Sig Start Date End Date Taking? Authorizing Provider  albuterol  (PROVENTIL) (2.5 MG/3ML) 0.083% nebulizer solution Take 3 mLs (2.5 mg total) by nebulization every 6 (six) hours as needed for wheezing or shortness of breath. 04/06/19  Yes Bing Neighbors, NP  allopurinol (ZYLOPRIM) 100 MG tablet Take 1 tablet (100 mg total) by mouth 2 (two) times daily. 11/30/22  Yes Azucena Fallen, MD  ascorbic acid (VITAMIN C) 500 MG tablet Take 500 mg by mouth daily.   Yes [provider]  atorvastatin (LIPITOR) 10 MG tablet Take 10 mg by mouth every evening. 12/03/21  Yes [provider]  busPIRone (BUSPAR) 5 MG tablet Take 1 tablet (5 mg total) by mouth 3 (three) times daily. 05/04/23  Yes Rodolph Bong, MD  cetirizine (ZYRTEC) 10 MG tablet Take 10 mg by mouth daily. 08/05/22  Yes [provider]  famotidine (PEPCID) 20 MG tablet Take 20 mg by mouth daily before breakfast.   Yes [provider]  ferric citrate (AURYXIA) 1 GM 210 MG(Fe) tablet Take 2 tablets (420 mg total) by mouth 3 (three) times daily with meals. Patient taking differently: Take 210 mg by mouth 3 (three) times daily with meals. 11/30/22  Yes Azucena Fallen, MD  fluticasone [facility]) 50 MCG/ACT nasal spray Place 2 sprays into both nostrils daily as needed for allergies or rhinitis. 12/19/18  Yes Rai, Ripudeep K, MD  folic acid (FOLVITE) 1 MG tablet Take 1 tablet (1 mg total) by mouth daily. 01/10/23  Yes Danford, Earl Lites, MD  hydrocortisone cream 1 % Apply 1 Application topically 2 (two) times daily.   Yes [provider]  HYDROmorphone (DILAUDID) 2 MG tablet Take 1 tablet (2 mg total) by mouth every 6 (six) hours as needed for severe pain (pain score 7-10) or moderate pain (pain score 4-6). 05/04/23  Yes Rodolph Bong, MD  hydrOXYzine (ATARAX) 25 MG tablet Take 1 tablet (25 mg total) by mouth 3 (three) times daily as needed for anxiety. Patient taking differently: Take 25 mg by mouth See admin instructions. Give 25 mg (1 tablet) every 6 hours  as needed for anxiety, agitation for 14 days (starting 11/19), then every 8 hours as needed. 01/10/23  Yes Danford, Earl Lites, MD  insulin lispro (HUMALOG) 100 UNIT/ML KwikPen Before each meal 3 times a day, 140-199 - 2 units, 200-250 - 6 units, 251-299 - 8 units,  300-349 - 12  units,  350 or above 14 units. Patient taking differently: Inject 0-14 Units into the skin See admin instructions. Inject 0-14 units per sliding scale before meals: < 70 notify MD 70-139 : 0 units 140-199 : 2 units 200-250 : 6 units 251-299 : 8 units 300-349 : 12 units 350-400 : 14 units > 401 notify MD. 06/04/22  Yes Leroy Sea, MD  Lactobacillus (ACIDOPHILUS) CAPS capsule Take 1 capsule by mouth 3 (three) times daily with meals. 03/08/23  Yes Elgergawy, Leana Roe, MD  methocarbamol (ROBAXIN) 500 MG tablet Take 1 tablet (500 mg total) by mouth every 6 (six) hours as needed for muscle spasms. 01/10/23  Yes Danford, Earl Lites, MD  metoprolol tartrate (LOPRESSOR) 25 MG tablet Take 1 tablet (25 mg total) by mouth 2 (two) times daily. 03/08/23  Yes Elgergawy, Leana Roe, MD  midodrine (PROAMATINE) 10 MG tablet Take 1 tablet (10 mg total) by mouth Every Tuesday,Thursday,and Saturday with dialysis. 05/05/23  Yes Pola Corn, NP  midodrine (PROAMATINE) 10 MG tablet Take 10 mg by mouth 3 (three) times daily.   Yes [provider]  mirtazapine (REMERON) 15 MG tablet Take 15 mg by mouth at bedtime. 10/18/22  Yes [provider]  Multiple Vitamins-Minerals (SENIOR MULTIVITAMIN PLUS PO) Take 1 tablet by mouth daily.   Yes [provider]  Nutritional Supplements (,FEEDING SUPPLEMENT, PROSOURCE PLUS) liquid Take 30 mLs by mouth 2 (two) times daily between meals. 01/10/23  Yes Danford, Earl Lites, MD  ondansetron (ZOFRAN-ODT) 4 MG disintegrating tablet Take 1 tablet (4 mg total) by mouth every 8 (eight) hours as needed for nausea or vomiting. 05/04/23  Yes Rodolph Bong, MD  pantoprazole  (PROTONIX) 40 MG tablet Take 1 tablet (40 mg total) by mouth 2 (two) times daily. Patient taking differently: Take 40 mg by mouth daily. 02/18/23  Yes Raulkar, Drema Pry, MD  risperiDONE (RISPERDAL M-TABS) 1 MG disintegrating tablet Take 1 tablet (1 mg total) by mouth at bedtime. 05/04/23  Yes Rodolph Bong, MD  saccharomyces boulardii (FLORASTOR) 250 MG capsule Take 250 mg by mouth in the morning, at noon, and at bedtime.   Yes [provider]    Current Facility-Administered Medications  Medication Dose Route Frequency Provider Last Rate Last Admin   (feeding supplement) PROSource Plus liquid 30 mL  30 mL Oral BID BM Orland Mustard, MD   30 mL at 06/03/23 1033   acidophilus (RISAQUAD) capsule 1 capsule  1 capsule Oral TID WC Orland Mustard, MD   1 capsule at 06/03/23 1327   allopurinol (ZYLOPRIM) tablet 100 mg  100 mg Oral BID Orland Mustard, MD   100 mg at 06/03/23 1032   atorvastatin (LIPITOR) tablet 10 mg  10 mg Oral QPM Orland Mustard, MD   10 mg at 06/02/23 1730   busPIRone (BUSPAR) tablet 5 mg  5 mg Oral TID Orland Mustard, MD   5 mg at 06/03/23 1327   cholestyramine light (PREVALITE) packet 4 g  4 g Oral BID Rai, Ripudeep K, MD   4 g at 06/03/23 1328   famotidine (PEPCID) tablet 20 mg  20 mg Oral QAC breakfast Orland Mustard, MD   20 mg at 06/03/23 0606   ferric citrate (AURYXIA) tablet 210 mg  210 mg Oral TID WC Orland Mustard, MD   210 mg at 06/03/23 1327   folic acid (FOLVITE) tablet 1 mg  1 mg Oral Daily Orland Mustard, MD   1 mg at 06/03/23 1032  Gerhardt's butt cream   Topical BID Orland Mustard, MD   Given at 06/03/23 1032   heparin injection 5,000 Units  5,000 Units Subcutaneous Q8H Orland Mustard, MD   5,000 Units at 06/03/23 1326   HYDROmorphone (DILAUDID) injection 1-2 mg  1-2 mg Intravenous Q3H PRN Rai, Ripudeep K, MD   2 mg at 06/03/23 1034   hydrOXYzine (ATARAX) tablet 25 mg  25 mg Oral TID PRN Orland Mustard, MD   25 mg at 06/03/23 0309   insulin aspart  (novoLOG) injection 0-6 Units  0-6 Units Subcutaneous TID WC Orland Mustard, MD   1 Units at 06/03/23 1329   loperamide (IMODIUM) capsule 2 mg  2 mg Oral PRN Howerter, Justin B, DO   2 mg at 06/02/23 2133   methocarbamol (ROBAXIN) tablet 500 mg  500 mg Oral Q6H PRN Howerter, Justin B, DO   500 mg at 06/03/23 0514   metoprolol tartrate (LOPRESSOR) tablet 25 mg  25 mg Oral BID Orland Mustard, MD   25 mg at 06/03/23 1031   midodrine (PROAMATINE) tablet 10 mg  10 mg Oral Q T,Th,Sa-HD Orland Mustard, MD   10 mg at 06/02/23 1610   mirtazapine (REMERON) tablet 15 mg  15 mg Oral QHS Orland Mustard, MD   15 mg at 06/02/23 2000   ondansetron [facility]) injection 4 mg  4 mg Intravenous Q6H PRN Orland Mustard, MD   4 mg at 06/03/23 0305   pantoprazole (PROTONIX) EC tablet 40 mg  40 mg Oral BID Silvana Newness, RPH   40 mg at 06/03/23 1033   risperiDONE (RISPERDAL M-TABS) disintegrating tablet 1 mg  1 mg Oral QHS Orland Mustard, MD   1 mg at 06/02/23 2000    Allergies as of 05/29/2023 - Review Complete 05/29/2023  Allergen Reaction Noted   Gabapentin Hives and [name] 03/27/2017   Isovue [iopamidol] Anaphylaxis, [name], and Other (See Comments) 11/28/2017   Nsaids Anaphylaxis and Other (See Comments) 09/21/2015   Penicillins Itching, Palpitations, and Other (See Comments) 02/04/2011   Reglan [metoclopramide] Other (See Comments) 08/08/2019   Valium [diazepam] [name] 09/21/2011   Zestril [lisinopril] Anaphylaxis and Swelling 09/21/2011   Tolectin [tolmetin] Nausea And Vomiting, Nausea Only, and Other (See Comments) 09/21/2015   Asa [aspirin] Other (See Comments) 09/26/2015   Aspartame and phenylalanine Hives 09/10/2022   Bentyl [dicyclomine] Other (See Comments) 04/15/2022   Hibiclens [chlorhexidine gluconate] Other (See Comments)    Flexeril [cyclobenzaprine] Palpitations 05/17/2020   Oxycontin [oxycodone] Palpitations 10/09/2020   Rifamycins Palpitations 07/25/2020    Tylenol [acetaminophen] Nausea And Vomiting, Nausea Only, and Other (See Comments) 08/28/2015   Ultram [tramadol] Nausea And Vomiting and Palpitations 12/22/2015    Family History  Problem Relation Age of Onset   Diabetes Mother    Diabetes Father    Heart disease Father    Diabetes Sister    Congestive Heart Failure Sister 32   Diabetes Brother     Social History   Socioeconomic History   Marital status: Married    Spouse name: Not on file   Number of children: Not on file   Years of education: Not on file   Highest education level: Not on file  Occupational History   Not on file  Tobacco Use   Smoking status: Never   Smokeless tobacco: Never  Vaping Use   Vaping status: Never Used  Substance and Sexual Activity   Alcohol use: No   Drug use: No  Sexual activity: Not Currently    Birth control/protection: None  Other Topics Concern   Not on file  Social History Narrative   ** Merged History Encounter **       Social Drivers of Health   Financial Resource Strain: Low Risk (03/23/2021)   Received [facility])   Financial Resource Strain  Food Insecurity: No Food Insecurity (05/29/2023)   Hunger Vital Sign    Worried About Running Out of Food in the Last Year: Never true    Ran Out of Food in the Last Year: Never true  Transportation Needs: Unmet Transportation Needs (05/29/2023)   PRAPARE - Administrator, Civil Service (Medical): Yes    Lack of Transportation (Non-Medical): Yes  Physical Activity: Not on File (10/31/2017)   Received from [place], [place]   Physical Activity    Physical Activity: 0  Stress: Low Risk (03/23/2021)   Received from [facility])   Stress    Over the last 2 weeks, how often have you been bothered by the following problems: feeling nervous, anxious, on edge?: Not at all    Over the last 2 weeks, how often have you been bothered by the following problems:  Not being able to stop or control worrying?: Not at all  Social Connections: Unknown (07/19/2021)   Received from [facility]   Social Network    Social Network: Not on file  Intimate Partner Violence: Not At Risk (05/29/2023)   Humiliation, Afraid, Rape, and Kick questionnaire    Fear of Current or Ex-Partner: No    Emotionally Abused: No    Physically Abused: No    Sexually Abused: No    Review of Systems: ROS is O/W negative except as mentioned in HPI.  Physical Exam: Vital signs in last 24 hours: Temp:  [97.1 F (36.2 C)-98.3 F (36.8 C)] 97.5 F (36.4 C) (03/28 0717) Pulse Rate:  [95-111] 95 (03/28 1114) Resp:  [15-18] 17 (03/28 0717) BP: (90-115)/(47-81) 96/72 (03/28 0717) SpO2:  [94 %-100 %] 97 % (03/28 1114) Last BM Date : 06/02/23 General:  Alert, chronically ill-appearing, pleasant and cooperative in NAD, but dosing off several times during our conversation. Head:  Normocephalic and atraumatic. Eyes:  Sclera clear, no icterus.  Conjunctiva pink. Ears:  Normal auditory acuity. Mouth:  No deformity or lesions.   Lungs:  Clear throughout to auscultation.  No wheezes, crackles, or rhonchi.  Heart:  Regular rate and rhythm; no murmurs, clicks, rubs, or gallops. Abdomen:  Soft, non-distended.  BS present.  Expresses diffuse TTP. Msk:  Symmetrical without gross deformities. Pulses:  Normal pulses noted. Extremities:  Right BKA and LEE with edema. Neurologic:  Alert and oriented x 4;  grossly normal neurologically. Skin:  Intact without significant lesions or rashes. Psych:  Alert and cooperative. Normal mood and affect.  Intake/Output from previous day: 03/27 0701 - 03/28 0700 In: 480 [P.O.:480] Out: -   Lab Results: Recent Labs    06/02/23 0710 06/03/23 0243  WBC 9.5 9.2  HGB 8.6* 8.2*  HCT 28.3* 27.3*  PLT 249 235   BMET Recent Labs    06/02/23 0710 06/03/23 0243  NA 136 136  K 4.1 3.8  CL 103 102  CO2 18* 23  GLUCOSE 116* 122*   BUN 39* 22*  CREATININE 7.65* 5.29*  CALCIUM 8.3* 8.1*   LFT Recent Labs    06/03/23 0243  ALBUMIN 1.9*  IMPRESSION:  Intractable Nausea/ Vomiting/acute on chronic abdominal pain, acute on chronic diarrhea with history of C. Difficile.   - Patient reports history of chronic abdominal pain and diarrhea, presented with worsening diarrhea, abdominal pain (She was recently treated for C. difficile at her SNF). -CT abdomen/pelvis without contrast 05/29/23 unremarkable -C. difficile negative inpatient, GI pathogen panel negative.  Diarrhea may continue for a while despite negative Cdiff. -Suspect diabetic gastroparesis but she is not able to be on Reglan due to side effect of tardive dyskinesia and cannot take erythromycin because she developed an elevated heart rate.  Was supposed to be weaning off narcotics and following a gastroparesis diet during last hospitalization. ESRD on hemodialysis, TTS  Anemia of chronic disease, normocytic, due to ESRD -H&H stable at 8.2 grams today, at baseline Paroxysmal atrial fibrillation (HCC): -No longer on Eliquis due to GI bleeding in 2024 October.  Malignant carcinoid tumor of duodenum:  S/p excision. Right BKA   PLAN: -Pantoprazole 40 mg BID. -Questran 4 grams BID.  Apparently has been intermittently refusing this.  Imodium currently scheduled prn, but could consider scheduling this once or twice a day. -? Trial of bentyl or levsin. -Zofran prn right now, ? If that could be scheduled as well. -Continue probiotic for now.  Princella Pellegrini. Zehr  06/03/2023, 2:11 PM

## 2023-06-03 NOTE — TOC Progression Note (Signed)
 Transition of Care [facility]) - Progression Note    Patient Details  Name: Deborah [name] MRN: [id number] Date of Birth: 1965/11/29  Transition of Care [facility]) CM/SW Contact  Michaela Corner, [place] Phone Number: 06/03/2023, 12:24 PM  Clinical Narrative:   CSW met patient at bedside to discuss discharge plan. Patient was very lethargic and was falling asleep mid conversation. Patient stated she is leaning towards [facility] but want a family member to tour the facility first. CSW discussed patients desire to transition to LTC and asked if she is okay with having to turn over SSI check to facility. Patient stated not really. As the conversation progressed, patient continued to fall asleep mid sentences. CSW will follow up on patients discharge plan when she is more alert.   TOC will continue to follow.    Expected Discharge Plan: Skilled Nursing Facility Barriers to Discharge: Continued Medical Work up, Other (must enter comment) (Awaiting PT/OT to see)  Expected Discharge Plan and Services In-house Referral: Clinical Social Work     Living arrangements for the past 2 months: Skilled Holiday representative                   DME Agency: NA                   Social Determinants of Health (SDOH) Interventions SDOH Screenings   Food Insecurity: No Food Insecurity (05/29/2023)  Housing: Patient Declined (05/29/2023)  Recent Concern: Housing - High Risk (04/25/2023)  Transportation Needs: Unmet Transportation Needs (05/29/2023)  Utilities: Not At Risk (05/29/2023)  Depression (PHQ2-9): Medium Risk (03/16/2023)  Financial Resource Strain: Low Risk (03/23/2021)   Received from [facility])  Physical Activity: Not on File (10/31/2017)   Received from [place], [place]  Social Connections: Unknown (07/19/2021)   Received from [facility], [facility]  Stress: Low Risk (03/23/2021)   Received from [facility])  Tobacco Use:  Low Risk  (05/29/2023)    Readmission Risk Interventions    03/07/2023    4:19 PM 11/25/2022    5:16 PM 07/05/2022    1:20 PM  Readmission Risk Prevention Plan  Transportation Screening Complete Complete Complete  Medication Review (RN Care Manager) Complete Complete Referral to Pharmacy  PCP or Specialist appointment within 3-5 days of discharge Complete Complete Complete  HRI or Home Care Consult Complete Complete Complete  SW Recovery Care/Counseling Consult Complete Complete Complete  Palliative Care Screening Complete Not Applicable Not Applicable  Skilled Nursing Facility Not Applicable Not Applicable Not Applicable

## 2023-06-04 DIAGNOSIS — R110 Nausea: Secondary | ICD-10-CM | POA: Diagnosis not present

## 2023-06-04 DIAGNOSIS — E876 Hypokalemia: Secondary | ICD-10-CM

## 2023-06-04 DIAGNOSIS — I5033 Acute on chronic diastolic (congestive) heart failure: Secondary | ICD-10-CM | POA: Diagnosis not present

## 2023-06-04 DIAGNOSIS — D638 Anemia in other chronic diseases classified elsewhere: Secondary | ICD-10-CM | POA: Diagnosis not present

## 2023-06-04 DIAGNOSIS — G894 Chronic pain syndrome: Secondary | ICD-10-CM

## 2023-06-04 DIAGNOSIS — I509 Heart failure, unspecified: Secondary | ICD-10-CM | POA: Diagnosis not present

## 2023-06-04 LAB — RENAL FUNCTION PANEL
Albumin: 2.4 g/dL — ABNORMAL LOW (ref 3.5–5.0)
Albumin: 2.1 g/dL — ABNORMAL LOW (ref 3.5–5.0)
Anion gap: 13 (ref 5–15)
Anion gap: 12 (ref 5–15)
BUN: 31 mg/dL — ABNORMAL HIGH (ref 6–20)
BUN: 36 mg/dL — ABNORMAL HIGH (ref 6–20)
CO2: 21 mmol/L — ABNORMAL LOW (ref 22–32)
CO2: 21 mmol/L — ABNORMAL LOW (ref 22–32)
Calcium: 8.9 mg/dL (ref 8.9–10.3)
Calcium: 8.5 mg/dL — ABNORMAL LOW (ref 8.9–10.3)
Chloride: 101 mmol/L (ref 98–111)
Chloride: 101 mmol/L (ref 98–111)
Creatinine, Ser: 6.55 mg/dL — ABNORMAL HIGH (ref 0.44–1.00)
Creatinine, Ser: 7.11 mg/dL — ABNORMAL HIGH (ref 0.44–1.00)
GFR, Estimated: 7 mL/min — ABNORMAL LOW (ref >60)
GFR, Estimated: 6 mL/min — ABNORMAL LOW (ref >60)
Glucose, Bld: 135 mg/dL — ABNORMAL HIGH (ref 70–99)
Glucose, Bld: 123 mg/dL — ABNORMAL HIGH (ref 70–99)
Phosphorus: 8.7 mg/dL — ABNORMAL HIGH (ref 2.5–4.6)
Phosphorus: 9.5 mg/dL — ABNORMAL HIGH (ref 2.5–4.6)
Potassium: 4.6 mmol/L (ref 3.5–5.1)
Potassium: 4 mmol/L (ref 3.5–5.1)
Sodium: 135 mmol/L (ref 135–145)
Sodium: 134 mmol/L — ABNORMAL LOW (ref 135–145)

## 2023-06-04 LAB — CBC
HCT: 30.4 % — ABNORMAL LOW (ref 36.0–46.0)
HCT: 27.4 % — ABNORMAL LOW (ref 36.0–46.0)
Hemoglobin: 9.3 g/dL — ABNORMAL LOW (ref 12.0–15.0)
Hemoglobin: 8.2 g/dL — ABNORMAL LOW (ref 12.0–15.0)
MCH: 30.1 pg (ref 26.0–34.0)
MCH: 29.9 pg (ref 26.0–34.0)
MCHC: 30.6 g/dL (ref 30.0–36.0)
MCHC: 29.9 g/dL — ABNORMAL LOW (ref 30.0–36.0)
MCV: 98.4 fL (ref 80.0–100.0)
MCV: 100 fL (ref 80.0–100.0)
Platelets: 235 10*3/uL (ref 150–400)
Platelets: 223 10*3/uL (ref 150–400)
RBC: 3.09 MIL/uL — ABNORMAL LOW (ref 3.87–5.11)
RBC: 2.74 MIL/uL — ABNORMAL LOW (ref 3.87–5.11)
RDW: 15.2 % (ref 11.5–15.5)
RDW: 15 % (ref 11.5–15.5)
WBC: 11.1 10*3/uL — ABNORMAL HIGH (ref 4.0–10.5)
WBC: 11 10*3/uL — ABNORMAL HIGH (ref 4.0–10.5)
nRBC: 0 % (ref 0.0–0.2)
nRBC: 0 % (ref 0.0–0.2)

## 2023-06-04 LAB — GLUCOSE, CAPILLARY
Glucose-Capillary: 146 mg/dL — ABNORMAL HIGH (ref 70–99)
Glucose-Capillary: 146 mg/dL — ABNORMAL HIGH (ref 70–99)
Glucose-Capillary: 132 mg/dL — ABNORMAL HIGH (ref 70–99)

## 2023-06-04 MED ORDER — HYOSCYAMINE SULFATE 0.125 MG SL SUBL
0.1250 mg | SUBLINGUAL_TABLET | Freq: Three times a day (TID) | SUBLINGUAL | Status: DC
  Administered 2023-06-04 – 2023-06-20 (×28): 0.125 mg via SUBLINGUAL
  Filled 2023-06-04 (×52): qty 1

## 2023-06-04 MED ORDER — METOPROLOL TARTRATE 12.5 MG HALF TABLET
12.5000 mg | ORAL_TABLET | Freq: Two times a day (BID) | ORAL | Status: DC
  Administered 2023-06-04 – 2023-06-20 (×25): 12.5 mg via ORAL
  Filled 2023-06-04 (×27): qty 1

## 2023-06-04 MED ORDER — HEPARIN SODIUM (PORCINE) 1000 UNIT/ML DIALYSIS
20.0000 [IU]/kg | INTRAMUSCULAR | Status: DC | PRN
  Administered 2023-06-04: 2200 [IU] via INTRAVENOUS_CENTRAL
  Filled 2023-06-04 (×2): qty 3

## 2023-06-04 NOTE — Progress Notes (Signed)
 Patient ID: Deborah [name], female   DOB: [birth year], 58 y.o.   MRN: [id number] S: Complaining of pain "all over" but no diarrhea O:BP 109/87 (BP Location: Right Arm)   Pulse 95   Temp 98.1 F (36.7 C) (Oral)   Resp 10   Ht (P) 5\' 6"  (1.676 m)   Wt 110.1 kg   LMP 10/10/2012   SpO2 100%   BMI (P) 39.18 kg/m   Intake/Output Summary (Last 24 hours) at 06/04/2023 8657 Last data filed at 06/03/2023 1843 Gross per 24 hour  Intake 237 ml  Output --  Net 237 ml   Intake/Output: I/O last 3 completed shifts: In: 717 [P.O.:717] Out: -   Intake/Output this shift:  No intake/output data recorded. Weight change:  Gen: NAD CVS: RRR Resp:CTA Abd: +BS, soft, mild tenderness to palpation Ext: s/p RBKA, trace edema LLE, LUE AVF +T/B  Recent Labs  Lab 05/29/23 0301 05/30/23 0316 05/31/23 1154 06/02/23 0710 06/03/23 0243 06/04/23 0252  NA 137 139 136 136 136 135  K 3.2* 3.6 3.5 4.1 3.8 4.6  CL 107 112* 103 103 102 101  CO2 17* 16* 23 18* 23 21*  GLUCOSE 216* 124* 144* 116* 122* 135*  BUN 35* 38* 23* 39* 22* 31*  CREATININE 8.22* 8.66* 5.37* 7.65* 5.29* 6.55*  ALBUMIN 2.2*  --   --  2.0* 1.9* 2.4*  CALCIUM 8.3* 8.3* 7.9* 8.3* 8.1* 8.9  PHOS  --   --  6.1* 10.5* 7.6* 8.7*  AST 14*  --   --   --   --   --   ALT 11  --   --   --   --   --    Liver Function Tests: Recent Labs  Lab 05/29/23 0301 06/02/23 0710 06/03/23 0243 06/04/23 0252  AST 14*  --   --   --   ALT 11  --   --   --   ALKPHOS 101  --   --   --   BILITOT 0.9  --   --   --   PROT 6.5  --   --   --   ALBUMIN 2.2* 2.0* 1.9* 2.4*   Recent Labs  Lab 05/29/23 0301 06/03/23 0905  LIPASE 43 24  AMYLASE  --  64   No results for input(s): "AMMONIA" in the last 168 hours. CBC: Recent Labs  Lab 05/29/23 0301 05/30/23 0316 05/31/23 1154 06/02/23 0710 06/03/23 0243 06/04/23 0252  WBC 10.8* 11.8* 11.0* 9.5 9.2 11.1*  NEUTROABS 7.2  --   --   --   --   --   HGB 9.5* 8.5* 8.3* 8.6* 8.2* 9.3*  HCT 32.1*  28.3* 27.0* 28.3* 27.3* 30.4*  MCV 100.0 100.4* 98.2 98.6 99.6 98.4  PLT 362 304 259 249 235 235   Cardiac Enzymes: No results for input(s): "CKTOTAL", "CKMB", "CKMBINDEX", "TROPONINI" in the last 168 hours. CBG: Recent Labs  Lab 06/03/23 0535 06/03/23 1113 06/03/23 1656 06/03/23 2116 06/04/23 0617  GLUCAP 150* 166* 115* 156* 146*    Iron Studies: No results for input(s): "IRON", "TIBC", "TRANSFERRIN", "FERRITIN" in the last 72 hours. Studies/Results: No results found.  (feeding supplement) PROSource Plus  30 mL Oral BID BM   acidophilus  1 capsule Oral TID WC   allopurinol  100 mg Oral BID   atorvastatin  10 mg Oral QPM   busPIRone  5 mg Oral TID   cholestyramine light  4 g Oral BID  famotidine  20 mg Oral QAC breakfast   ferric citrate  210 mg Oral TID WC   folic acid  1 mg Oral Daily   Gerhardt's butt cream   Topical BID   heparin  5,000 Units Subcutaneous Q8H   insulin aspart  0-6 Units Subcutaneous TID WC   metoprolol tartrate  25 mg Oral BID   midodrine  10 mg Oral Q T,Th,Sa-HD   mirtazapine  15 mg Oral QHS   pantoprazole  40 mg Oral BID   risperiDONE  1 mg Oral QHS    BMET    Component Value Date/Time   NA 135 06/04/2023 0252   NA 137 09/20/2019 1530   K 4.6 06/04/2023 0252   CL 101 06/04/2023 0252   CO2 21 (L) 06/04/2023 0252   GLUCOSE 135 (H) 06/04/2023 0252   BUN 31 (H) 06/04/2023 0252   BUN 18 09/20/2019 1530   CREATININE 6.55 (H) 06/04/2023 0252   CALCIUM 8.9 06/04/2023 0252   GFRNONAA 7 (L) 06/04/2023 0252   GFRAA 49 (L) 11/30/2019 0356   CBC    Component Value Date/Time   WBC 11.1 (H) 06/04/2023 0252   RBC 3.09 (L) 06/04/2023 0252   HGB 9.3 (L) 06/04/2023 0252   HGB 8.8 (L) 10/09/2019 0906   HGB 9.6 (L) 09/03/2019 1620   HCT 30.4 (L) 06/04/2023 0252   HCT 30.4 (L) 09/03/2019 1620   PLT 235 06/04/2023 0252   PLT 348 10/09/2019 0906   PLT 451 (H) 09/03/2019 1620   MCV 98.4 06/04/2023 0252   MCV 85 09/03/2019 1620   MCH 30.1 06/04/2023  0252   MCHC 30.6 06/04/2023 0252   RDW 15.2 06/04/2023 0252   RDW 13.6 09/03/2019 1620   LYMPHSABS 2.6 05/29/2023 0301   LYMPHSABS 2.2 09/03/2019 1620   MONOABS 0.6 05/29/2023 0301   EOSABS 0.3 05/29/2023 0301   EOSABS 0.3 09/03/2019 1620   BASOSABS 0.1 05/29/2023 0301   BASOSABS 0.1 09/03/2019 1620    OP HD: [facility]   B400 101.5 kg 3K bath TDC   heparin none last HD 3/1 9, post weight 105.5 kg Not getting to dry weight, 4-8 kg over Mircera 225 mcg every 2 weeks Hectorol 4 mcg 3 times weekly   Assessment/ Plan: Chronic diarrhea / hx of Cdif, taking po vanc. Per pmd ESRD: on HD TTS.  Completed HD yesterday without UF due to low bp and needed IVF to support bp.  Plan for HD tomorrow to keep on her schedule. Vascular access - issues with [facility] yesterday with low BFR and high pressures.  Tpa to dwell until next HD session today.  She does have LUE AVF transposed 12/31/22 and should be able to use but she is reluctant.  May need to curb side VVS to eyeball AVF.  Appears mature and ready for use.  BP: chronic hypotension on midodrine at home. Cont midodrine.  Volume: she is above edw, however bp dropping with UF due to diarrhea. Anemia of esrd: Hb 9-10 , follow.  Secondary hyperparathyroidism: CCa in range, cont binders ac.  Irena Cords, MD [facility]

## 2023-06-04 NOTE — Progress Notes (Signed)
 [facility]                                                                              [place], is a 58 y.o. female, DOB - Feb 13, 1966, UJW:[id number] Admit date - 05/29/2023    Outpatient Primary MD for the patient is Pcp, No  LOS - 5  days  Chief Complaint  Patient presents with   Chest Pain   Abdominal Pain       Brief summary   Patient is a 58 yrs old female with ESRD on HD TTS, gastroparesis, GERD, PAF not on AC, diastolic CHF with preserved EF 55 to 60%, gout, duodenal carcinoid tumor, PVD s/p right-sided BKA, history of gastric AVM, NASH, depression, anxiety, obesity, restrictive lung disease, spinal stenosis, chronic pain syndrome,T2DM, chronic left heel diabetic ulcer, and history of chronic diarrhea in the setting of carcinoid tumor who presented to ED with complaints of diarrhea in setting of C.Diff and abdominal pain.  Patient has missed hemodialysis because of having intractable nausea and vomiting.  Patient has been taking oral vancomycin for C. Difficile+.  CT A&P no acute findings.  Patient is admitted for fluid overload due to missed hemodialysis due to diarrhea and intractable nausea and vomiting.    Assessment & Plan    Principal Problem: Intractable Nausea/ Vomiting : Acute on chronic abdominal pain, acute on chronic diarrhea with history of C. difficile - Patient reports history of chronic abdominal pain and diarrhea, presented with worsening diarrhea, abdominal pain (She was recently treated for C. difficile at her SNF). -CT abdomen/pelvis : No acute intraabdominal abnormality found. -C. difficile negative inpatient, GI pathogen panel negative -Continue symptomatic management, pain control, antiemetics as needed -Missed HD x 2 sessions and was volume overloaded contributing to her nausea, nephrology following for HD. -Previous CT scans did not show pancreatic issues.  Amylase 64, lipase 24 -Previous EGD in 04/2023 had shown  gastritis, duodenitis, requested GI consult -Continue Protonix 40 mg twice daily -Appreciate GI recommendations, continue cholestyramine, PPI 40 mg twice daily, ?  Trial of Bentyl or Levsin, scheduled Zofran? -Patient is allergic to Bentyl, will place on Levsin 3 times daily and reassess.     Anemia of chronic disease: Baseline Hgb 8-9 Stable, continue to monitor .  ESRD on hemodialysis, TTS -Nephrology following, continue HD per schedule   Acute on chronic diastolic CHF: Volume overloaded as she missed her dialysis x 2 -Volume management with HD   Chronic hypokalemia: Likely secondary to diarrhea -Replace as needed   Chronic pain syndrome/chronic abdominal pain: Continue IV Dilaudid as needed for pain. Continue home Robaxin as needed and gabapentin     Insulin dependent type 2 diabetes mellitus (HCC) Hb A1C of 5.7 in 01/2023 Continue sensitive SSI  CBG (last 3)  Recent Labs    06/03/23 1656 06/03/23 2116 06/04/23 0617  GLUCAP 115* 156* 146*   Anemia of chronic disease, ESRD -H&H stable, at baseline   Paroxysmal atrial fibrillation (HCC): HR controlled, continue metoprolol No longer on Eliquis due to GI bleeding in 2024 October.    Malignant carcinoid tumor of duodenum [facility]) S/p excision  GERD (gastroesophageal reflux disease) Continue PPI BID   Chronic depression/anxiety Continue remeron, buspar and hydroxyzine    Chronic gout Continue allopurinol 100 mg twice daily.    Chronic wound of left heel: Wound care consult.   Obesity class II Estimated body mass index is 39.18 kg/m (pended) as calculated from the following:   Height as of this encounter: (P) 5\' 6"  (1.676 m).   Weight as of this encounter: 110.1 kg.  Code Status: Full code DVT Prophylaxis:  heparin injection 5,000 Units Start: 05/29/23 1400   Level of Care: Level of care: Telemetry Cardiac Family Communication: Updated patient Disposition Plan:      Remains inpatient appropriate:  Awaiting SNF   Procedures:  Hemodialysis  Consultants:   Nephrology GI  Antimicrobials:   Anti-infectives (From admission, onward)    None          Medications  (feeding supplement) PROSource Plus  30 mL Oral BID BM   acidophilus  1 capsule Oral TID WC   allopurinol  100 mg Oral BID   atorvastatin  10 mg Oral QPM   busPIRone  5 mg Oral TID   cholestyramine light  4 g Oral BID   famotidine  20 mg Oral QAC breakfast   ferric citrate  210 mg Oral TID WC   folic acid  1 mg Oral Daily   Gerhardt's butt cream   Topical BID   heparin  5,000 Units Subcutaneous Q8H   insulin aspart  0-6 Units Subcutaneous TID WC   metoprolol tartrate  25 mg Oral BID   midodrine  10 mg Oral Q T,Th,Sa-HD   mirtazapine  15 mg Oral QHS   pantoprazole  40 mg Oral BID   risperiDONE  1 mg Oral QHS      Subjective:   Deborah [name] was seen and examined today.  Sitting up in the bed, no acute nausea vomit.  Still complaining of abdominal pain however states diarrhea is improving.   Objective:   Vitals:   06/03/23 1945 06/04/23 0017 06/04/23 0403 06/04/23 0500  BP: (!) 141/121 112/72 109/87   Pulse:  97 95   Resp: 10 18 10    Temp: 98.2 F (36.8 C) 97.8 F (36.6 C) 98.1 F (36.7 C)   TempSrc: Oral Oral Oral   SpO2: 100% 100% 100%   Weight:    110.1 kg  Height:        Intake/Output Summary (Last 24 hours) at 06/04/2023 1043 Last data filed at 06/03/2023 1843 Gross per 24 hour  Intake 237 ml  Output --  Net 237 ml     Wt Readings from Last 3 Encounters:  06/04/23 110.1 kg  05/03/23 102.8 kg  03/22/23 113.4 kg   Physical Exam General: Alert and oriented x 3, NAD Cardiovascular: S1 S2 clear, RRR.  Respiratory: CTAB, no wheezing, Gastrointestinal: Soft, mild diffuse TTP, nondistended, NBS Ext: no pedal edema bilaterally Neuro: no new deficits Psych: Normal affect     Data Reviewed:  I have personally reviewed following labs    CBC Lab Results  Component Value  Date   WBC 11.1 (H) 06/04/2023   RBC 3.09 (L) 06/04/2023   HGB 9.3 (L) 06/04/2023   HCT 30.4 (L) 06/04/2023   MCV 98.4 06/04/2023   MCH 30.1 06/04/2023   PLT 235 06/04/2023   MCHC 30.6 06/04/2023   RDW 15.2 06/04/2023   LYMPHSABS 2.6 05/29/2023   MONOABS 0.6 05/29/2023   EOSABS 0.3 05/29/2023   BASOSABS 0.1 05/29/2023  Last metabolic panel Lab Results  Component Value Date   NA 135 06/04/2023   K 4.6 06/04/2023   CL 101 06/04/2023   CO2 21 (L) 06/04/2023   BUN 31 (H) 06/04/2023   CREATININE 6.55 (H) 06/04/2023   GLUCOSE 135 (H) 06/04/2023   GFRNONAA 7 (L) 06/04/2023   GFRAA 49 (L) 11/30/2019   CALCIUM 8.9 06/04/2023   PHOS 8.7 (H) 06/04/2023   PROT 6.5 05/29/2023   ALBUMIN 2.4 (L) 06/04/2023   BILITOT 0.9 05/29/2023   ALKPHOS 101 05/29/2023   AST 14 (L) 05/29/2023   ALT 11 05/29/2023   ANIONGAP 13 06/04/2023    CBG (last 3)  Recent Labs    06/03/23 1656 06/03/23 2116 06/04/23 0617  GLUCAP 115* 156* 146*      Coagulation Profile: No results for input(s): "INR", "PROTIME" in the last 168 hours.   Radiology Studies: I have personally reviewed the imaging studies  No results found.     Thad Ranger M.D. [facility] 06/04/2023, 10:43 AM  Available via Epic secure chat 7am-7pm After 7 pm, please refer to night coverage provider listed on amion.

## 2023-06-04 NOTE — Progress Notes (Signed)
 Received patient in bed to unit.  Alert and oriented x3 Informed consent signed and in chart.   TX duration:3.5 hours   Patient tolerated well.  Transported back to the room 3 E bed 29 Alert, without acute distress.  Hand-off given to patient's nurse. [name] RN  Access used: Right CVC Access issues: None,   Total UF removed: 1000 Medication(s) given: none  Post HD weight: 107.2 KG Post HD VS: 101/64, P 101, Resp 13, 94 % on room air   [name]

## 2023-06-04 NOTE — Progress Notes (Signed)
  RN reported that patient blood pressure is soft 101/64.  Patient has history of atrial fibrillation currently on metoprolol 25 mg twice daily.  Decreasing dose of metoprolol to 12.5 mg twice daily with holding parameter if systolic blood pressure dropped below 90.  Tereasa Coop, MD [facility] 06/04/2023, 7:38 PM

## 2023-06-05 DIAGNOSIS — D638 Anemia in other chronic diseases classified elsewhere: Secondary | ICD-10-CM | POA: Diagnosis not present

## 2023-06-05 DIAGNOSIS — R110 Nausea: Secondary | ICD-10-CM | POA: Diagnosis not present

## 2023-06-05 DIAGNOSIS — I509 Heart failure, unspecified: Secondary | ICD-10-CM | POA: Diagnosis not present

## 2023-06-05 DIAGNOSIS — I5033 Acute on chronic diastolic (congestive) heart failure: Secondary | ICD-10-CM | POA: Diagnosis not present

## 2023-06-05 LAB — GLUCOSE, CAPILLARY
Glucose-Capillary: 139 mg/dL — ABNORMAL HIGH (ref 70–99)
Glucose-Capillary: 187 mg/dL — ABNORMAL HIGH (ref 70–99)
Glucose-Capillary: 189 mg/dL — ABNORMAL HIGH (ref 70–99)
Glucose-Capillary: 177 mg/dL — ABNORMAL HIGH (ref 70–99)

## 2023-06-05 LAB — RENAL FUNCTION PANEL
Albumin: 2.1 g/dL — ABNORMAL LOW (ref 3.5–5.0)
Anion gap: 9 (ref 5–15)
BUN: 21 mg/dL — ABNORMAL HIGH (ref 6–20)
CO2: 25 mmol/L (ref 22–32)
Calcium: 7.9 mg/dL — ABNORMAL LOW (ref 8.9–10.3)
Chloride: 100 mmol/L (ref 98–111)
Creatinine, Ser: 4.76 mg/dL — ABNORMAL HIGH (ref 0.44–1.00)
GFR, Estimated: 10 mL/min — ABNORMAL LOW (ref >60)
Glucose, Bld: 190 mg/dL — ABNORMAL HIGH (ref 70–99)
Phosphorus: 5.7 mg/dL — ABNORMAL HIGH (ref 2.5–4.6)
Potassium: 3.6 mmol/L (ref 3.5–5.1)
Sodium: 134 mmol/L — ABNORMAL LOW (ref 135–145)

## 2023-06-05 LAB — CBC
HCT: 26.2 % — ABNORMAL LOW (ref 36.0–46.0)
Hemoglobin: 7.8 g/dL — ABNORMAL LOW (ref 12.0–15.0)
MCH: 29.8 pg (ref 26.0–34.0)
MCHC: 29.8 g/dL — ABNORMAL LOW (ref 30.0–36.0)
MCV: 100 fL (ref 80.0–100.0)
Platelets: 214 10*3/uL (ref 150–400)
RBC: 2.62 MIL/uL — ABNORMAL LOW (ref 3.87–5.11)
RDW: 15.2 % (ref 11.5–15.5)
WBC: 8.7 10*3/uL (ref 4.0–10.5)
nRBC: 0 % (ref 0.0–0.2)

## 2023-06-05 MED ORDER — SODIUM CHLORIDE 0.9 % IV BOLUS
250.0000 mL | INTRAVENOUS | Status: AC
Start: 2023-06-05 — End: 2023-06-05
  Administered 2023-06-05: 250 mL via INTRAVENOUS

## 2023-06-05 MED ORDER — MIDODRINE HCL 5 MG PO TABS: 10.0000 mg | ORAL_TABLET | Freq: Once | ORAL | Status: DC

## 2023-06-05 MED ORDER — ALBUMIN HUMAN 25 % IV SOLN
25.0000 g | INTRAVENOUS | Status: AC
  Administered 2023-06-05: 25 g via INTRAVENOUS
  Filled 2023-06-05: qty 100

## 2023-06-05 MED ORDER — MIDODRINE HCL 5 MG PO TABS
10.0000 mg | ORAL_TABLET | ORAL | Status: AC
  Administered 2023-06-05: 10 mg via ORAL
  Filled 2023-06-05: qty 2

## 2023-06-05 NOTE — Progress Notes (Signed)
 [facility]                                                                              [place], is a 58 y.o. female, DOB - 04-10-1965, ZHY:[id number] Admit date - 05/29/2023    Outpatient Primary MD for the patient is Pcp, No  LOS - 6  days  Chief Complaint  Patient presents with   Chest Pain   Abdominal Pain       Brief summary   Patient is a 58 yrs old female with ESRD on HD TTS, gastroparesis, GERD, PAF not on AC, diastolic CHF with preserved EF 55 to 60%, gout, duodenal carcinoid tumor, PVD s/p right-sided BKA, history of gastric AVM, NASH, depression, anxiety, obesity, restrictive lung disease, spinal stenosis, chronic pain syndrome,T2DM, chronic left heel diabetic ulcer, and history of chronic diarrhea in the setting of carcinoid tumor who presented to ED with complaints of diarrhea in setting of C.Diff and abdominal pain.  Patient has missed hemodialysis because of having intractable nausea and vomiting.  Patient has been taking oral vancomycin for C. Difficile+.  CT A&P no acute findings.  Patient is admitted for fluid overload due to missed hemodialysis due to diarrhea and intractable nausea and vomiting.    Assessment & Plan    Principal Problem: Intractable Nausea/ Vomiting : Acute on chronic abdominal pain, acute on chronic diarrhea with history of C. difficile - Patient reports history of chronic abdominal pain and diarrhea, presented with worsening diarrhea, abdominal pain (She was recently treated for C. difficile at her SNF). -CT abdomen/pelvis : No acute intraabdominal abnormality found. -C. difficile negative inpatient, GI pathogen panel negative -Continue symptomatic management, pain control, antiemetics as needed -Missed HD x 2 sessions and was volume overloaded contributing to her nausea, nephrology following for HD. -Previous CT scans did not show pancreatic issues.  Amylase 64, lipase 24 -Previous EGD in 04/2023 had shown  gastritis, duodenitis, requested GI consult -Continue Protonix 40 mg twice daily -Continues to have abdominal pain, GI following -Continue cholestyramine, PPI twice daily, trial of Levsin (allergic to Bentyl)    Anemia of chronic disease/ESRD: Baseline Hgb 8-9 Hemoglobin 7.8, follow, no bleeding issues .  ESRD on hemodialysis, TTS -Nephrology following, continue HD per schedule   Acute on chronic diastolic CHF: Volume overloaded as she missed her dialysis x 2 -Volume management with HD   Chronic hypokalemia: Likely secondary to diarrhea -Replace as needed   Chronic pain syndrome/chronic abdominal pain: Continue IV Dilaudid as needed for pain. Continue home Robaxin as needed and gabapentin     Insulin dependent type 2 diabetes mellitus (HCC) Hb A1C of 5.7 in 01/2023 Continue sensitive SSI  CBG (last 3)  Recent Labs    06/04/23 2105 06/05/23 0538 06/05/23 1101  GLUCAP 132* 139* 187*     Paroxysmal atrial fibrillation (HCC): HR controlled, continue metoprolol No longer on Eliquis due to GI bleeding in 2024 October.    Malignant carcinoid tumor of duodenum (HCC) S/p excision    GERD (gastroesophageal reflux disease) Continue PPI BID   Chronic depression/anxiety Continue remeron, buspar and hydroxyzine    Chronic gout Continue allopurinol  100 mg twice daily.    Chronic wound of left heel: Wound care consult.   Obesity class II Estimated body mass index is 37.93 kg/m (pended) as calculated from the following:   Height as of this encounter: (P) 5\' 6"  (1.676 m).   Weight as of this encounter: 106.6 kg.  Code Status: Full code DVT Prophylaxis:  heparin injection 5,000 Units Start: 05/29/23 1400   Level of Care: Level of care: Telemetry Cardiac Family Communication: Updated patient Disposition Plan:      Remains inpatient appropriate: Awaiting SNF   Procedures:  Hemodialysis  Consultants:   Nephrology GI  Antimicrobials:   Anti-infectives (From  admission, onward)    None          Medications  (feeding supplement) PROSource Plus  30 mL Oral BID BM   acidophilus  1 capsule Oral TID WC   allopurinol  100 mg Oral BID   atorvastatin  10 mg Oral QPM   busPIRone  5 mg Oral TID   cholestyramine light  4 g Oral BID   famotidine  20 mg Oral QAC breakfast   ferric citrate  210 mg Oral TID WC   folic acid  1 mg Oral Daily   Gerhardt's butt cream   Topical BID   heparin  5,000 Units Subcutaneous Q8H   hyoscyamine  0.125 mg Sublingual TID   insulin aspart  0-6 Units Subcutaneous TID WC   metoprolol tartrate  12.5 mg Oral BID   midodrine  10 mg Oral Q T,Th,Sa-HD   mirtazapine  15 mg Oral QHS   pantoprazole  40 mg Oral BID   risperiDONE  1 mg Oral QHS      Subjective:   Deborah [name] was seen and examined today.  Sitting up in the bed, eating breakfast but complaining of abdominal pain.  No acute nausea or vomiting.  Diarrhea appears to be improving  Objective:   Vitals:   06/05/23 0548 06/05/23 0759 06/05/23 0800 06/05/23 1056  BP:  105/67 105/67 97/64  Pulse:  (!) 107 (!) 105 98  Resp:  18  20  Temp:  (!) 97.3 F (36.3 C)  (!) 97.5 F (36.4 C)  TempSrc:  Oral  Oral  SpO2:  97%  97%  Weight: 106.6 kg     Height:        Intake/Output Summary (Last 24 hours) at 06/05/2023 1323 Last data filed at 06/05/2023 0852 Gross per 24 hour  Intake 717 ml  Output 1000 ml  Net -283 ml     Wt Readings from Last 3 Encounters:  06/05/23 106.6 kg  05/03/23 102.8 kg  03/22/23 113.4 kg    Physical Exam General: Alert and oriented x 3, anxious Cardiovascular: S1 S2 clear, RRR.  Respiratory: CTAB, no wheezing Gastrointestinal: Soft, nontender, nondistended, NBS Ext: no pedal edema bilaterally Neuro: no new deficits Psych: anxious and tearful     Data Reviewed:  I have personally reviewed following labs    CBC Lab Results  Component Value Date   WBC 8.7 06/05/2023   RBC 2.62 (L) 06/05/2023   HGB 7.8 (L)  06/05/2023   HCT 26.2 (L) 06/05/2023   MCV 100.0 06/05/2023   MCH 29.8 06/05/2023   PLT 214 06/05/2023   MCHC 29.8 (L) 06/05/2023   RDW 15.2 06/05/2023   LYMPHSABS 2.6 05/29/2023   MONOABS 0.6 05/29/2023   EOSABS 0.3 05/29/2023   BASOSABS 0.1 05/29/2023     Last metabolic panel Lab Results  Component  Value Date   NA 134 (L) 06/05/2023   K 3.6 06/05/2023   CL 100 06/05/2023   CO2 25 06/05/2023   BUN 21 (H) 06/05/2023   CREATININE 4.76 (H) 06/05/2023   GLUCOSE 190 (H) 06/05/2023   GFRNONAA 10 (L) 06/05/2023   GFRAA 49 (L) 11/30/2019   CALCIUM 7.9 (L) 06/05/2023   PHOS 5.7 (H) 06/05/2023   PROT 6.5 05/29/2023   ALBUMIN 2.1 (L) 06/05/2023   BILITOT 0.9 05/29/2023   ALKPHOS 101 05/29/2023   AST 14 (L) 05/29/2023   ALT 11 05/29/2023   ANIONGAP 9 06/05/2023    CBG (last 3)  Recent Labs    06/04/23 2105 06/05/23 0538 06/05/23 1101  GLUCAP 132* 139* 187*      Coagulation Profile: No results for input(s): "INR", "PROTIME" in the last 168 hours.   Radiology Studies: I have personally reviewed the imaging studies  No results found.     Thad Ranger M.D. [facility] 06/05/2023, 1:23 PM  Available via Epic secure chat 7am-7pm After 7 pm, please refer to night coverage provider listed on amion.

## 2023-06-05 NOTE — Progress Notes (Signed)
  History of chronic hypotension Acute hypotension RN reported that patient blood pressure is soft 90/54 MAP 61. - Patient has intractable nausea vomiting and chronic diarrhea. -Also dialysis patient TTS schedule. -Same time patient has acute on chronic CHF exacerbation due to missing dialysis. -Of note patient has chronic hypotension and she is on midodrine 10 mg with dialysis schedule.  Due to soft blood pressure giving 250 mL of NS bolus and 25 g of albumin.  Giving midodrine 10 mg.  As dialysis and CHF patient unable to give liberal fluid resuscitation.   Tereasa Coop, MD [facility] 06/05/2023, 5:48 AM

## 2023-06-05 NOTE — TOC Progression Note (Signed)
 Transition of Care [facility]) - Progression Note    Patient Details  Name: Deborah [name] MRN: [id number] Date of Birth: 1966-02-13  Transition of Care [facility]) CM/SW Contact  Patrice Paradise, LCSW Phone Number: 06/05/2023, 12:13 PM  Clinical Narrative:     CSW spoke with pt at bedside to get facility choice. Pt stated that she is leaning towards [facility] and would like to make final decision tomorrow.  TOC team will continue to assist with discharge planning needs.   Expected Discharge Plan: Skilled Nursing Facility Barriers to Discharge: Continued Medical Work up, Other (must enter comment) (Awaiting PT/OT to see)  Expected Discharge Plan and Services In-house Referral: Clinical Social Work     Living arrangements for the past 2 months: Skilled Holiday representative                   DME Agency: NA                   Social Determinants of Health (SDOH) Interventions SDOH Screenings   Food Insecurity: No Food Insecurity (05/29/2023)  Housing: Patient Declined (05/29/2023)  Recent Concern: Housing - High Risk (04/25/2023)  Transportation Needs: Unmet Transportation Needs (05/29/2023)  Utilities: Not At Risk (05/29/2023)  Depression (PHQ2-9): Medium Risk (03/16/2023)  Financial Resource Strain: Low Risk (03/23/2021)   Received from [facility])  Physical Activity: Not on File (10/31/2017)   Received from [place], [place]  Social Connections: Unknown (07/19/2021)   Received from [facility]  Stress: Low Risk (03/23/2021)   [facility])  Tobacco Use: Low Risk  (05/29/2023)    Readmission Risk Interventions    03/07/2023    4:19 PM 11/25/2022    5:16 PM 07/05/2022    1:20 PM  Readmission Risk Prevention Plan  Transportation Screening Complete Complete Complete  Medication Review (RN Care Manager) Complete Complete Referral to Pharmacy  PCP or Specialist appointment within 3-5 days  of discharge Complete Complete Complete  HRI or Home Care Consult Complete Complete Complete  SW Recovery Care/Counseling Consult Complete Complete Complete  Palliative Care Screening Complete Not Applicable Not Applicable  Skilled Nursing Facility Not Applicable Not Applicable Not Applicable

## 2023-06-05 NOTE — Progress Notes (Signed)
 Patient ID: Deborah [name], female   DOB: [birth year], 58 y.o.   MRN: [id number] S: Complaining of abdominal pain this morning O:BP 105/67 (BP Location: Right Wrist)   Pulse (!) 107   Temp (!) 97.3 F (36.3 C) (Oral)   Resp 18   Ht (P) 5\' 6"  (1.676 m)   Wt 106.6 kg   LMP 10/10/2012   SpO2 97%   BMI (P) 37.93 kg/m   Intake/Output Summary (Last 24 hours) at 06/05/2023 1031 Last data filed at 06/05/2023 0852 Gross per 24 hour  Intake 717 ml  Output 1000 ml  Net -283 ml   Intake/Output: I/O last 3 completed shifts: In: 480 [P.O.:480] Out: 1000 [Other:1000]  Intake/Output this shift:  Total I/O In: 237 [P.O.:237] Out: -  Weight change: -1.4 kg Gen: NAD CVS: Tachy Resp: CTA Abd: +BS, soft, NT/ND Ext: s/p RBKA, trace edema LLE with wrapping on left ankle, LUE AVF +T/B  Recent Labs  Lab 05/30/23 0316 05/31/23 1154 06/02/23 0710 06/03/23 0243 06/04/23 0252 06/04/23 1419  NA 139 136 136 136 135 134*  K 3.6 3.5 4.1 3.8 4.6 4.0  CL 112* 103 103 102 101 101  CO2 16* 23 18* 23 21* 21*  GLUCOSE 124* 144* 116* 122* 135* 123*  BUN 38* 23* 39* 22* 31* 36*  CREATININE 8.66* 5.37* 7.65* 5.29* 6.55* 7.11*  ALBUMIN  --   --  2.0* 1.9* 2.4* 2.1*  CALCIUM 8.3* 7.9* 8.3* 8.1* 8.9 8.5*  PHOS  --  6.1* 10.5* 7.6* 8.7* 9.5*   Liver Function Tests: Recent Labs  Lab 06/03/23 0243 06/04/23 0252 06/04/23 1419  ALBUMIN 1.9* 2.4* 2.1*   Recent Labs  Lab 06/03/23 0905  LIPASE 24  AMYLASE 64   No results for input(s): "AMMONIA" in the last 168 hours. CBC: Recent Labs  Lab 05/31/23 1154 06/02/23 0710 06/03/23 0243 06/04/23 0252 06/04/23 1419  WBC 11.0* 9.5 9.2 11.1* 11.0*  HGB 8.3* 8.6* 8.2* 9.3* 8.2*  HCT 27.0* 28.3* 27.3* 30.4* 27.4*  MCV 98.2 98.6 99.6 98.4 100.0  PLT 259 249 235 235 223   Cardiac Enzymes: No results for input(s): "CKTOTAL", "CKMB", "CKMBINDEX", "TROPONINI" in the last 168 hours. CBG: Recent Labs  Lab 06/03/23 2116 06/04/23 0617 06/04/23 1234  06/04/23 2105 06/05/23 0538  GLUCAP 156* 146* 146* 132* 139*    Iron Studies: No results for input(s): "IRON", "TIBC", "TRANSFERRIN", "FERRITIN" in the last 72 hours. Studies/Results: No results found.  (feeding supplement) PROSource Plus  30 mL Oral BID BM   acidophilus  1 capsule Oral TID WC   allopurinol  100 mg Oral BID   atorvastatin  10 mg Oral QPM   busPIRone  5 mg Oral TID   cholestyramine light  4 g Oral BID   famotidine  20 mg Oral QAC breakfast   ferric citrate  210 mg Oral TID WC   folic acid  1 mg Oral Daily   Gerhardt's butt cream   Topical BID   heparin  5,000 Units Subcutaneous Q8H   hyoscyamine  0.125 mg Sublingual TID   insulin aspart  0-6 Units Subcutaneous TID WC   metoprolol tartrate  12.5 mg Oral BID   midodrine  10 mg Oral Q T,Th,Sa-HD   mirtazapine  15 mg Oral QHS   pantoprazole  40 mg Oral BID   risperiDONE  1 mg Oral QHS    BMET    Component Value Date/Time   NA 134 (L) 06/04/2023 1419   NA  137 09/20/2019 1530   K 4.0 06/04/2023 1419   CL 101 06/04/2023 1419   CO2 21 (L) 06/04/2023 1419   GLUCOSE 123 (H) 06/04/2023 1419   BUN 36 (H) 06/04/2023 1419   BUN 18 09/20/2019 1530   CREATININE 7.11 (H) 06/04/2023 1419   CALCIUM 8.5 (L) 06/04/2023 1419   GFRNONAA 6 (L) 06/04/2023 1419   GFRAA 49 (L) 11/30/2019 0356   CBC    Component Value Date/Time   WBC 11.0 (H) 06/04/2023 1419   RBC 2.74 (L) 06/04/2023 1419   HGB 8.2 (L) 06/04/2023 1419   HGB 8.8 (L) 10/09/2019 0906   HGB 9.6 (L) 09/03/2019 1620   HCT 27.4 (L) 06/04/2023 1419   HCT 30.4 (L) 09/03/2019 1620   PLT 223 06/04/2023 1419   PLT 348 10/09/2019 0906   PLT 451 (H) 09/03/2019 1620   MCV 100.0 06/04/2023 1419   MCV 85 09/03/2019 1620   MCH 29.9 06/04/2023 1419   MCHC 29.9 (L) 06/04/2023 1419   RDW 15.0 06/04/2023 1419   RDW 13.6 09/03/2019 1620   LYMPHSABS 2.6 05/29/2023 0301   LYMPHSABS 2.2 09/03/2019 1620   MONOABS 0.6 05/29/2023 0301   EOSABS 0.3 05/29/2023 0301   EOSABS  0.3 09/03/2019 1620   BASOSABS 0.1 05/29/2023 0301   BASOSABS 0.1 09/03/2019 1620    OP HD: TTS [place] 4h   B400 101.5 kg 3K bath TDC   heparin none last HD 3/1 9, post weight 105.5 kg Not getting to dry weight, 4-8 kg over Mircera 225 mcg every 2 weeks Hectorol 4 mcg 3 times weekly   Assessment/ Plan: Chronic diarrhea / hx of Cdif, taking po vanc. Per pmd ESRD: on HD TTS.  Completed HD yesterday without UF due to low bp and needed IVF to support bp.  Plan for HD tomorrow to keep on her schedule. Vascular access - Tpa dwelled and removed yesterday with good BFR for treatment.  She does have LUE AVF transposed 12/31/22 and should be able to use but she is reluctant.  May need to curb side VVS to eyeball AVF.  Appears mature and ready for use.  BP: chronic hypotension on midodrine at home. Cont midodrine.  Volume: she is above edw, however bp dropping with UF due to diarrhea. Anemia of esrd: Hb 9-10 , follow.  Secondary hyperparathyroidism: CCa in range, cont binders ac.  Irena Cords, MD [facility] [phone]

## 2023-06-06 ENCOUNTER — Other Ambulatory Visit: Payer: Self-pay

## 2023-06-06 DIAGNOSIS — N186 End stage renal disease: Secondary | ICD-10-CM | POA: Diagnosis not present

## 2023-06-06 DIAGNOSIS — I5033 Acute on chronic diastolic (congestive) heart failure: Secondary | ICD-10-CM | POA: Diagnosis not present

## 2023-06-06 DIAGNOSIS — R110 Nausea: Secondary | ICD-10-CM | POA: Diagnosis not present

## 2023-06-06 DIAGNOSIS — N184 Chronic kidney disease, stage 4 (severe): Secondary | ICD-10-CM

## 2023-06-06 DIAGNOSIS — Z992 Dependence on renal dialysis: Secondary | ICD-10-CM | POA: Diagnosis not present

## 2023-06-06 DIAGNOSIS — K3184 Gastroparesis: Secondary | ICD-10-CM | POA: Diagnosis not present

## 2023-06-06 DIAGNOSIS — D638 Anemia in other chronic diseases classified elsewhere: Secondary | ICD-10-CM | POA: Diagnosis not present

## 2023-06-06 DIAGNOSIS — K529 Noninfective gastroenteritis and colitis, unspecified: Secondary | ICD-10-CM | POA: Diagnosis not present

## 2023-06-06 LAB — TROPONIN I (HIGH SENSITIVITY): Troponin I (High Sensitivity): 12 ng/L (ref <18)

## 2023-06-06 LAB — CBC
HCT: 29.5 % — ABNORMAL LOW (ref 36.0–46.0)
Hemoglobin: 8.7 g/dL — ABNORMAL LOW (ref 12.0–15.0)
MCH: 29.6 pg (ref 26.0–34.0)
MCHC: 29.5 g/dL — ABNORMAL LOW (ref 30.0–36.0)
MCV: 100.3 fL — ABNORMAL HIGH (ref 80.0–100.0)
Platelets: 269 10*3/uL (ref 150–400)
RBC: 2.94 MIL/uL — ABNORMAL LOW (ref 3.87–5.11)
RDW: 15 % (ref 11.5–15.5)
WBC: 9.4 10*3/uL (ref 4.0–10.5)
nRBC: 0 % (ref 0.0–0.2)

## 2023-06-06 LAB — GLUCOSE, CAPILLARY
Glucose-Capillary: 121 mg/dL — ABNORMAL HIGH (ref 70–99)
Glucose-Capillary: 176 mg/dL — ABNORMAL HIGH (ref 70–99)
Glucose-Capillary: 141 mg/dL — ABNORMAL HIGH (ref 70–99)
Glucose-Capillary: 140 mg/dL — ABNORMAL HIGH (ref 70–99)

## 2023-06-06 LAB — BASIC METABOLIC PANEL WITH GFR
Anion gap: 13 (ref 5–15)
BUN: 30 mg/dL — ABNORMAL HIGH (ref 6–20)
CO2: 23 mmol/L (ref 22–32)
Calcium: 8.5 mg/dL — ABNORMAL LOW (ref 8.9–10.3)
Chloride: 103 mmol/L (ref 98–111)
Creatinine, Ser: 6.32 mg/dL — ABNORMAL HIGH (ref 0.44–1.00)
GFR, Estimated: 7 mL/min — ABNORMAL LOW (ref >60)
Glucose, Bld: 131 mg/dL — ABNORMAL HIGH (ref 70–99)
Potassium: 4 mmol/L (ref 3.5–5.1)
Sodium: 139 mmol/L (ref 135–145)

## 2023-06-06 MED ORDER — DARBEPOETIN ALFA 200 MCG/0.4ML IJ SOSY
200.0000 ug | PREFILLED_SYRINGE | INTRAMUSCULAR | Status: DC
  Filled 2023-06-06: qty 0.4

## 2023-06-06 MED ORDER — LOPERAMIDE HCL 2 MG PO CAPS
2.0000 mg | ORAL_CAPSULE | Freq: Four times a day (QID) | ORAL | Status: DC
  Administered 2023-06-06 – 2023-06-20 (×51): 2 mg via ORAL
  Filled 2023-06-06 (×53): qty 1

## 2023-06-06 MED ORDER — NITROGLYCERIN 0.4 MG SL SUBL
0.4000 mg | SUBLINGUAL_TABLET | SUBLINGUAL | Status: DC | PRN
  Administered 2023-06-06 – 2023-06-08 (×2): 0.4 mg via SUBLINGUAL
  Filled 2023-06-06 (×3): qty 1

## 2023-06-06 NOTE — Plan of Care (Signed)
  Problem: Education: Goal: Ability to describe self-care measures that may prevent or decrease complications (Diabetes Survival Skills Education) will improve Outcome: Not Progressing Goal: Individualized Educational Video(s) Outcome: Not Progressing   Problem: Coping: Goal: Ability to adjust to condition or change in health will improve Outcome: Not Progressing   Problem: Fluid Volume: Goal: Ability to maintain a balanced intake and output will improve Outcome: Not Progressing   Problem: Health Behavior/Discharge Planning: Goal: Ability to identify and utilize available resources and services will improve Outcome: Not Progressing Goal: Ability to manage health-related needs will improve Outcome: Not Progressing   Problem: Metabolic: Goal: Ability to maintain appropriate glucose levels will improve Outcome: Not Progressing   Problem: Nutritional: Goal: Maintenance of adequate nutrition will improve Outcome: Not Progressing Goal: Progress toward achieving an optimal weight will improve Outcome: Not Progressing   Problem: Skin Integrity: Goal: Risk for impaired skin integrity will decrease Outcome: Not Progressing   Problem: Tissue Perfusion: Goal: Adequacy of tissue perfusion will improve Outcome: Not Progressing   Problem: Education: Goal: Knowledge of General Education information will improve Description: Including pain rating scale, medication(s)/side effects and non-pharmacologic comfort measures Outcome: Not Progressing   Problem: Health Behavior/Discharge Planning: Goal: Ability to manage health-related needs will improve Outcome: Not Progressing   Problem: Clinical Measurements: Goal: Ability to maintain clinical measurements within normal limits will improve Outcome: Not Progressing Goal: Will remain free from infection Outcome: Not Progressing Goal: Diagnostic test results will improve Outcome: Not Progressing Goal: Respiratory complications will  improve Outcome: Not Progressing Goal: Cardiovascular complication will be avoided Outcome: Not Progressing   Problem: Activity: Goal: Risk for activity intolerance will decrease Outcome: Not Progressing   Problem: Nutrition: Goal: Adequate nutrition will be maintained Outcome: Not Progressing   Problem: Coping: Goal: Level of anxiety will decrease Outcome: Not Progressing   Problem: Elimination: Goal: Will not experience complications related to bowel motility Outcome: Not Progressing Goal: Will not experience complications related to urinary retention Outcome: Not Progressing   Problem: Pain Managment: Goal: General experience of comfort will improve and/or be controlled Outcome: Not Progressing   Problem: Safety: Goal: Ability to remain free from injury will improve Outcome: Not Progressing   Problem: Skin Integrity: Goal: Risk for impaired skin integrity will decrease Outcome: Not Progressing

## 2023-06-06 NOTE — Progress Notes (Signed)
 [facility]                                                                              [place], is a 58 y.o. female, DOB - Apr 09, 1965, [id number] Admit date - 05/29/2023    Outpatient Primary MD for the patient is Pcp, No  LOS - 7  days  Chief Complaint  Patient presents with   Chest Pain   Abdominal Pain       Brief summary   Patient is a 58 yrs old female with ESRD on HD TTS, gastroparesis, GERD, PAF not on AC, diastolic CHF with preserved EF 55 to 60%, gout, duodenal carcinoid tumor, PVD s/p right-sided BKA, history of gastric AVM, NASH, depression, anxiety, obesity, restrictive lung disease, spinal stenosis, chronic pain syndrome,T2DM, chronic left heel diabetic ulcer, and history of chronic diarrhea in the setting of carcinoid tumor who presented to ED with complaints of diarrhea in setting of C.Diff and abdominal pain.  Patient has missed hemodialysis because of having intractable nausea and vomiting.  Patient has been taking oral vancomycin for C. Difficile+.  CT A&P no acute findings.  Patient is admitted for fluid overload due to missed hemodialysis due to diarrhea and intractable nausea and vomiting.    Assessment & Plan    Principal Problem: Acute on chronic abdominal pain, acute on chronic diarrhea with history of C. Difficile, nausea Gastroparesis - Patient reports history of chronic abdominal pain and diarrhea, presented with worsening diarrhea, abdominal pain (She was recently treated for C. difficile at her SNF). -CT abdomen/pelvis : No acute intraabdominal abnormality found. -C. difficile negative inpatient, GI pathogen panel negative --Previous CT scans did not show pancreatic issues.  Amylase 64, lipase 24 -Previous EGD in 04/2023 had shown gastritis, duodenitis,  -Continue Protonix 40 mg twice daily -Continues to have abdominal pain, GI following, suspecting worsening gastroparesis in the setting of missed HD sessions -Continue  cholestyramine, scheduled Imodium -Continue PPI twice daily, trial of Levsin (allergic to Bentyl) -Will avoid scheduled Zofran due to prolonged QTc.  She is tolerating diet.     Anemia of chronic disease/ESRD: Baseline Hgb 8-9 Hemoglobin 7.8, follow, no bleeding issues Obtain CBC  ESRD on hemodialysis, TTS -Nephrology following, continue HD per schedule   Acute on chronic diastolic CHF: Volume overloaded as she missed her dialysis x 2 -Volume management with HD   Chronic hypokalemia: Likely secondary to diarrhea -Replace as needed   Chronic pain syndrome/chronic abdominal pain:  -Concern for narcotic seeking behavior Continue IV Dilaudid as needed for pain. Continue home Robaxin as needed and gabapentin     Insulin dependent type 2 diabetes mellitus (HCC) Hb A1C of 5.7 in 01/2023 Continue sensitive SSI  CBG (last 3)  Recent Labs    06/05/23 1639 06/05/23 2113 06/06/23 0603  GLUCAP 189* 177* 121*     Paroxysmal atrial fibrillation (HCC): HR controlled, continue metoprolol No longer on Eliquis due to GI bleeding in 2024 October.    Malignant carcinoid tumor of duodenum (HCC) S/p excision    GERD (gastroesophageal reflux disease) Continue PPI BID   Chronic depression/anxiety Continue remeron, buspar and hydroxyzine  Chronic gout Continue allopurinol 100 mg twice daily.    Chronic wound of left heel: Wound care consult.   Obesity class II Estimated body mass index is 37.97 kg/m (pended) as calculated from the following:   Height as of this encounter: (P) 5\' 6"  (1.676 m).   Weight as of this encounter: 106.7 kg.  Code Status: Full code DVT Prophylaxis:  heparin injection 5,000 Units Start: 05/29/23 1400   Level of Care: Level of care: Telemetry Cardiac Family Communication: Updated patient Disposition Plan:      Remains inpatient appropriate: Awaiting SNF   Procedures:  Hemodialysis  Consultants:   Nephrology GI  Antimicrobials:    Anti-infectives (From admission, onward)    None          Medications  (feeding supplement) PROSource Plus  30 mL Oral BID BM   acidophilus  1 capsule Oral TID WC   allopurinol  100 mg Oral BID   atorvastatin  10 mg Oral QPM   busPIRone  5 mg Oral TID   cholestyramine light  4 g Oral BID   famotidine  20 mg Oral QAC breakfast   ferric citrate  210 mg Oral TID WC   folic acid  1 mg Oral Daily   Gerhardt's butt cream   Topical BID   heparin  5,000 Units Subcutaneous Q8H   hyoscyamine  0.125 mg Sublingual TID   insulin aspart  0-6 Units Subcutaneous TID WC   loperamide  2 mg Oral Q6H   metoprolol tartrate  12.5 mg Oral BID   midodrine  10 mg Oral Q T,Th,Sa-HD   mirtazapine  15 mg Oral QHS   pantoprazole  40 mg Oral BID   risperiDONE  1 mg Oral QHS      Subjective:   Deborah [name] was seen and examined today.  Complaining of abdominal pain, tolerating meals without any difficulty, no active vomiting.  No fevers or chills.   States still having loose stools.  Objective:   Vitals:   06/05/23 2100 06/05/23 2346 06/06/23 0520 06/06/23 0835  BP: 98/84 126/82 124/70 114/70  Pulse: (!) 110 (!) 108 96 92  Resp: 20 18 14 18   Temp: (!) 97.2 F (36.2 C) 98.1 F (36.7 C) 97.6 F (36.4 C) 97.7 F (36.5 C)  TempSrc: Oral Oral Oral Oral  SpO2: 98% 100% 98% 98%  Weight:   106.7 kg   Height:        Intake/Output Summary (Last 24 hours) at 06/06/2023 1000 Last data filed at 06/06/2023 0900 Gross per 24 hour  Intake 240 ml  Output --  Net 240 ml     Wt Readings from Last 3 Encounters:  06/06/23 106.7 kg  05/03/23 102.8 kg  03/22/23 113.4 kg   Physical Exam General: Alert and oriented x 3, NAD Cardiovascular: S1 S2 clear, RRR.  Respiratory: CTAB, no wheezing Gastrointestinal: Soft, nontender, nondistended, NBS Ext: no pedal edema bilaterally Neuro: no new deficits Psych: Normal affect      Data Reviewed:  I have personally reviewed following labs     CBC Lab Results  Component Value Date   WBC 8.7 06/05/2023   RBC 2.62 (L) 06/05/2023   HGB 7.8 (L) 06/05/2023   HCT 26.2 (L) 06/05/2023   MCV 100.0 06/05/2023   MCH 29.8 06/05/2023   PLT 214 06/05/2023   MCHC 29.8 (L) 06/05/2023   RDW 15.2 06/05/2023   LYMPHSABS 2.6 05/29/2023   MONOABS 0.6 05/29/2023   EOSABS 0.3 05/29/2023  BASOSABS 0.1 05/29/2023     Last metabolic panel Lab Results  Component Value Date   NA 134 (L) 06/05/2023   K 3.6 06/05/2023   CL 100 06/05/2023   CO2 25 06/05/2023   BUN 21 (H) 06/05/2023   CREATININE 4.76 (H) 06/05/2023   GLUCOSE 190 (H) 06/05/2023   GFRNONAA 10 (L) 06/05/2023   GFRAA 49 (L) 11/30/2019   CALCIUM 7.9 (L) 06/05/2023   PHOS 5.7 (H) 06/05/2023   PROT 6.5 05/29/2023   ALBUMIN 2.1 (L) 06/05/2023   BILITOT 0.9 05/29/2023   ALKPHOS 101 05/29/2023   AST 14 (L) 05/29/2023   ALT 11 05/29/2023   ANIONGAP 9 06/05/2023    CBG (last 3)  Recent Labs    06/05/23 1639 06/05/23 2113 06/06/23 0603  GLUCAP 189* 177* 121*      Coagulation Profile: No results for input(s): "INR", "PROTIME" in the last 168 hours.   Radiology Studies: I have personally reviewed the imaging studies  No results found.     Thad Ranger M.D. [facility] 06/06/2023, 10:00 AM  Available via Epic secure chat 7am-7pm After 7 pm, please refer to night coverage provider listed on amion.

## 2023-06-06 NOTE — TOC Progression Note (Signed)
 Transition of Care [facility]) - Progression Note    Patient Details  Name: Deborah [name] MRN: [id number] Date of Birth: Dec 25, 1965  Transition of Care [facility]) CM/SW Contact  Michaela Corner, [place] Phone Number: 06/06/2023, 10:05AM  Clinical Narrative:   CSW followed up with patient regarding choice of SNF placement. Patient is leaning towards [facility] but has not made a definitive choice. Patient inquired about when her SSI check being turned over to the facility for LTC, her belongings at [facility] and how they may be transferred, and her dialysis center changing. CSW and patient called [place] with [facility] and [place] with [facility] to help answer patients questions regarding placement, payment, and her belongings being transferred.   [facility] and [facility] explained that patient may potentially come to them for STR and transition to LTC. [facility] does not have LTC beds at this time but will assist patient in finding a LTC bed at another facility if her choice is to dc to [place]. [facility] has LTC beds but will need CSW to try for auth for STR first before transitioning patient into LTC.   Per Patient, her decision is contingent on her knowing if her HD center can be changed. CSW notified treatment team, and asked Renal Navigator to follow up with patient about transfer for HD center process, see Renal Navigators note for more details. CSW will follow up with patient regarding bed choice.  TOC will continue to follow.   Expected Discharge Plan: Skilled Nursing Facility Barriers to Discharge: Continued Medical Work up, Other (must enter comment) (Awaiting PT/OT to see)  Expected Discharge Plan and Services In-house Referral: Clinical Social Work     Living arrangements for the past 2 months: Skilled Holiday representative                   DME Agency: NA                   Social Determinants of Health (SDOH) Interventions SDOH Screenings   Food Insecurity: No  Food Insecurity (05/29/2023)  Housing: Patient Declined (05/29/2023)  Recent Concern: Housing - High Risk (04/25/2023)  Transportation Needs: Unmet Transportation Needs (05/29/2023)  Utilities: Not At Risk (05/29/2023)  Depression (PHQ2-9): Medium Risk (03/16/2023)  Financial Resource Strain: Low Risk (03/23/2021)   [facility])  Physical Activity: Not on File (10/31/2017)   Received from [place], [place]  Social Connections: Unknown (07/19/2021)   Received from [facility]  Stress: Low Risk (03/23/2021)   [facility])  Tobacco Use: Low Risk  (05/29/2023)    Readmission Risk Interventions    03/07/2023    4:19 PM 11/25/2022    5:16 PM 07/05/2022    1:20 PM  Readmission Risk Prevention Plan  Transportation Screening Complete Complete Complete  Medication Review (RN Care Manager) Complete Complete Referral to Pharmacy  PCP or Specialist appointment within 3-5 days of discharge Complete Complete Complete  HRI or Home Care Consult Complete Complete Complete  SW Recovery Care/Counseling Consult Complete Complete Complete  Palliative Care Screening Complete Not Applicable Not Applicable  Skilled Nursing Facility Not Applicable Not Applicable Not Applicable

## 2023-06-06 NOTE — Progress Notes (Signed)
      Progress Note   Subjective  Patient sitting in bed - eating, eggs and sausage / toast. States she has tried solids over weekend - had mac and cheese and did well with that, but ate a hamburger yesterday and vomited. Continues to have some loose stools.    Objective   Vital signs in last 24 hours: Temp:  [97.2 F (36.2 C)-98.1 F (36.7 C)] 97.7 F (36.5 C) (03/31 0835) Pulse Rate:  [92-110] 92 (03/31 0835) Resp:  [14-20] 18 (03/31 0835) BP: (97-126)/(53-84) 114/70 (03/31 0835) SpO2:  [97 %-100 %] 98 % (03/31 0835) Weight:  [106.7 kg] 106.7 kg (03/31 0520) Last BM Date : 06/05/23 General:    AA female in NAD. Neurologic:  Alert and oriented,  grossly normal neurologically. Psych:  Cooperative. Normal mood and affect.  Intake/Output from previous day: 03/30 0701 - 03/31 0700 In: 237 [P.O.:237] Out: -  Intake/Output this shift: No intake/output data recorded.  Lab Results: Recent Labs    06/04/23 0252 06/04/23 1419 06/05/23 1220  WBC 11.1* 11.0* 8.7  HGB 9.3* 8.2* 7.8*  HCT 30.4* 27.4* 26.2*  PLT 235 223 214   BMET Recent Labs    06/04/23 0252 06/04/23 1419 06/05/23 1220  NA 135 134* 134*  K 4.6 4.0 3.6  CL 101 101 100  CO2 21* 21* 25  GLUCOSE 135* 123* 190*  BUN 31* 36* 21*  CREATININE 6.55* 7.11* 4.76*  CALCIUM 8.9 8.5* 7.9*   LFT Recent Labs    06/05/23 1220  ALBUMIN 2.1*   PT/INR No results for input(s): "LABPROT", "INR" in the last 72 hours.  Studies/Results: No results found.     Assessment / Plan:    58 y/o female here with the following:  Gastroparesis Nausea / vomiting Diarrhea - history of C Diff, recent testing negative ESRD on HD  CT without clear cause, EGD UTD. I suspect worsening gastroparesis in the setting of missed HD sessions. Also has had  C Diff in the past but repeat stool testing negative. Perhaps post infectious functional bowel change.   Over the weekend seems to be doing better but her diet seems too  aggressive for gastroparesis, eating hamburgers, etc. Her gastroparesis is hard to manage as she cannot have Reglan (history of TD) and intolerance to erythromycin (elevated HR?). Continue supportive measures with protonix, pepcid, and Zofran. She is also on Remeron and would continue that. She is on cholestyramine for diarrhea and immodium PRN but recommend scheduled immodium as that has worked better for her in the past. I would try to minimize her narcotic use given gastroparesis. Discussed dietary measures, ideally would keep her on full liquid diet as that will be much better tolerated. She was emphatic she wants a regular diet but will try to make better choices in what she is eating to minimize worsening her symptoms.  PLAN: - recommended she avoid heavy / fatty foods. I suggested full liquid diet but she declines that - continue PPI, pepcid - continue Zofran, can give this to her scheduled - continue Remeron - would give her scheduled immodium q 6 hours and she can decline if not needed. Slowly taper down as needed.  Will sign off for now, please call with questions in the interim. She can follow up with her primary GI MD at [facility] upon discharge.  Harlin Rain, MD [facility]

## 2023-06-06 NOTE — Progress Notes (Signed)
 [facility] Progress Note   Subjective:   Denies new concerns today, reports she was able to eat some breakfast. Denies SOB, CP, dizziness.   Objective Vitals:   06/05/23 2100 06/05/23 2346 06/06/23 0520 06/06/23 0835  BP: 98/84 126/82 124/70 114/70  Pulse: (!) 110 (!) 108 96 92  Resp: 20 18 14 18   Temp: (!) 97.2 F (36.2 C) 98.1 F (36.7 C) 97.6 F (36.4 C) 97.7 F (36.5 C)  TempSrc: Oral Oral Oral Oral  SpO2: 98% 100% 98% 98%  Weight:   106.7 kg   Height:       Physical Exam General: Alert female in NAD Heart: RRR, no murmurs, rubs or gallops Lungs: CTA bilaterally Abdomen: Soft, non-distended, +BS Extremities: trace edema LLE, R BKA Dialysis Access:  LUE AVF +t/b  Additional Objective Labs: Basic Metabolic Panel: Recent Labs  Lab 06/04/23 0252 06/04/23 1419 06/05/23 1220  NA 135 134* 134*  K 4.6 4.0 3.6  CL 101 101 100  CO2 21* 21* 25  GLUCOSE 135* 123* 190*  BUN 31* 36* 21*  CREATININE 6.55* 7.11* 4.76*  CALCIUM 8.9 8.5* 7.9*  PHOS 8.7* 9.5* 5.7*   Liver Function Tests: Recent Labs  Lab 06/04/23 0252 06/04/23 1419 06/05/23 1220  ALBUMIN 2.4* 2.1* 2.1*   Recent Labs  Lab 06/03/23 0905  LIPASE 24  AMYLASE 64   CBC: Recent Labs  Lab 06/02/23 0710 06/03/23 0243 06/04/23 0252 06/04/23 1419 06/05/23 1220  WBC 9.5 9.2 11.1* 11.0* 8.7  HGB 8.6* 8.2* 9.3* 8.2* 7.8*  HCT 28.3* 27.3* 30.4* 27.4* 26.2*  MCV 98.6 99.6 98.4 100.0 100.0  PLT 249 235 235 223 214   Blood Culture    Component Value Date/Time   SDES BLOOD BLOOD RIGHT HAND 02/17/2023 2243   SPECREQUEST  02/17/2023 2243    BOTTLES DRAWN AEROBIC AND ANAEROBIC Blood Culture results may not be optimal due to an inadequate volume of blood received in culture bottles   CULT  02/17/2023 2243    NO GROWTH 5 DAYS Performed at [facility] Lab, [address], [place], [place] [address]    REPTSTATUS 02/22/2023 FINAL 02/17/2023 2243    Cardiac Enzymes: No results for  input(s): "CKTOTAL", "CKMB", "CKMBINDEX", "TROPONINI" in the last 168 hours. CBG: Recent Labs  Lab 06/05/23 0538 06/05/23 1101 06/05/23 1639 06/05/23 2113 06/06/23 0603  GLUCAP 139* 187* 189* 177* 121*   Iron Studies: No results for input(s): "IRON", "TIBC", "TRANSFERRIN", "FERRITIN" in the last 72 hours. @lablastinr3 @ Studies/Results: No results found. Medications:   (feeding supplement) PROSource Plus  30 mL Oral BID BM   acidophilus  1 capsule Oral TID WC   allopurinol  100 mg Oral BID   atorvastatin  10 mg Oral QPM   busPIRone  5 mg Oral TID   cholestyramine light  4 g Oral BID   famotidine  20 mg Oral QAC breakfast   ferric citrate  210 mg Oral TID WC   folic acid  1 mg Oral Daily   Gerhardt's butt cream   Topical BID   heparin  5,000 Units Subcutaneous Q8H   hyoscyamine  0.125 mg Sublingual TID   insulin aspart  0-6 Units Subcutaneous TID WC   loperamide  2 mg Oral Q6H   metoprolol tartrate  12.5 mg Oral BID   midodrine  10 mg Oral Q T,Th,Sa-HD   mirtazapine  15 mg Oral QHS   pantoprazole  40 mg Oral BID   risperiDONE  1  mg Oral QHS    OP Dialysis Orders:  [facility]S [facility] 4h   B400 101.5 kg 3K bath TDC   heparin none last HD 3/1 9, post weight 105.5 kg Not getting to dry weight, 4-8 kg over Mircera 225 mcg every 2 weeks Hectorol 4 mcg 3 times weekly  Assessment/Plan: Chronic diarrhea / hx of Cdif, taking po vanc. Per pmd ESRD: on HD TTS.  Continue TTS schedue Vascular access - Tpa dwelled and removed with good BFR for treatment.  She does have LUE AVF transposed 12/31/22 and should be able to use but she is reluctant.   Appears mature and ready for use. Will continue to encourage her to allow [place] to use it BP: chronic hypotension on midodrine at home. Cont midodrine.  Volume: she is above edw, however bp dropping with UF last HD. She does have some edema. Attempt 2L tomorrow as tolerated Anemia of esrd: 7.8- overdue for ESA, ordered aranesp Secondary  hyperparathyroidism: CCa in range, phos 5.7, cont binders ac.  Rogers Blocker, PA-C 06/06/2023, 11:22 AM  [facility] Pager: [phone]

## 2023-06-06 NOTE — Progress Notes (Addendum)
 Spoke to pt via phone per CSW request to explain that new clinic placement cannot be explored until pt makes final decision about snf. HD clinics will not provide clinic info until a formal referral has been made. Pt's insurance may prove to be a barrier for a referral to [facility] but can discuss further with [facility] staff regarding options for pt if pt opts to go to [facility]. Will assist as needed.   Olivia Canter Renal Navigator [phone]

## 2023-06-07 ENCOUNTER — Inpatient Hospital Stay (HOSPITAL_COMMUNITY)

## 2023-06-07 DIAGNOSIS — M7989 Other specified soft tissue disorders: Secondary | ICD-10-CM

## 2023-06-07 DIAGNOSIS — D638 Anemia in other chronic diseases classified elsewhere: Secondary | ICD-10-CM | POA: Diagnosis not present

## 2023-06-07 DIAGNOSIS — K529 Noninfective gastroenteritis and colitis, unspecified: Secondary | ICD-10-CM | POA: Diagnosis not present

## 2023-06-07 DIAGNOSIS — I5033 Acute on chronic diastolic (congestive) heart failure: Secondary | ICD-10-CM | POA: Diagnosis not present

## 2023-06-07 DIAGNOSIS — R110 Nausea: Secondary | ICD-10-CM | POA: Diagnosis not present

## 2023-06-07 LAB — GLUCOSE, CAPILLARY
Glucose-Capillary: 115 mg/dL — ABNORMAL HIGH (ref 70–99)
Glucose-Capillary: 137 mg/dL — ABNORMAL HIGH (ref 70–99)
Glucose-Capillary: 180 mg/dL — ABNORMAL HIGH (ref 70–99)

## 2023-06-07 LAB — CBC
HCT: 27.4 % — ABNORMAL LOW (ref 36.0–46.0)
Hemoglobin: 8.4 g/dL — ABNORMAL LOW (ref 12.0–15.0)
MCH: 30.3 pg (ref 26.0–34.0)
MCHC: 30.7 g/dL (ref 30.0–36.0)
MCV: 98.9 fL (ref 80.0–100.0)
Platelets: 308 10*3/uL (ref 150–400)
RBC: 2.77 MIL/uL — ABNORMAL LOW (ref 3.87–5.11)
RDW: 15 % (ref 11.5–15.5)
WBC: 11 10*3/uL — ABNORMAL HIGH (ref 4.0–10.5)
nRBC: 0 % (ref 0.0–0.2)

## 2023-06-07 LAB — BASIC METABOLIC PANEL WITH GFR
Anion gap: 15 (ref 5–15)
BUN: 35 mg/dL — ABNORMAL HIGH (ref 6–20)
CO2: 21 mmol/L — ABNORMAL LOW (ref 22–32)
Calcium: 8.5 mg/dL — ABNORMAL LOW (ref 8.9–10.3)
Chloride: 102 mmol/L (ref 98–111)
Creatinine, Ser: 6.85 mg/dL — ABNORMAL HIGH (ref 0.44–1.00)
GFR, Estimated: 6 mL/min — ABNORMAL LOW (ref >60)
Glucose, Bld: 100 mg/dL — ABNORMAL HIGH (ref 70–99)
Potassium: 4.4 mmol/L (ref 3.5–5.1)
Sodium: 138 mmol/L (ref 135–145)

## 2023-06-07 MED ORDER — HYDROXYZINE HCL 25 MG PO TABS
25.0000 mg | ORAL_TABLET | Freq: Four times a day (QID) | ORAL | Status: DC | PRN
  Administered 2023-06-07 – 2023-06-11 (×11): 25 mg via ORAL
  Filled 2023-06-07 (×11): qty 1

## 2023-06-07 MED ORDER — HEPARIN SODIUM (PORCINE) 1000 UNIT/ML IJ SOLN
INTRAMUSCULAR | Status: AC
Start: 2023-06-07 — End: 2023-06-08
  Filled 2023-06-07: qty 4

## 2023-06-07 NOTE — Progress Notes (Signed)
 [facility] Progress Note   Subjective:   Pt reports pain in her LUE and L flank since yesterday. [place] of LUE ordered due to fistula/swelling- pending. She reports fistula has not been used because she has not seen vascular yet to clear it for use. Denies SOB, CP, dizziness, nausea.   Objective Vitals:   06/07/23 0600 06/07/23 0753 06/07/23 0909 06/07/23 0920  BP:  121/77 122/60 127/65  Pulse:  (!) 111 (!) 110 (!) 109  Resp: 16  12 16   Temp:  99.2 F (37.3 C) 97.9 F (36.6 C)   TempSrc:  Oral    SpO2:   99% 100%  Weight:   110.1 kg   Height:       Physical Exam General: Alert female in NAD Heart: RRR, no murmurs, rubs or gallops Lungs: CTA bilaterally Abdomen: Soft, non-distended, +BS Extremities: 1+ edema LLE, R BKA Dialysis Access:  LUE AVF +t/b, [facility] accessed  Additional Objective Labs: Basic Metabolic Panel: Recent Labs  Lab 06/04/23 0252 06/04/23 1419 06/05/23 1220 06/06/23 1415 06/07/23 0343  NA 135 134* 134* 139 138  K 4.6 4.0 3.6 4.0 4.4  CL 101 101 100 103 102  CO2 21* 21* 25 23 21*  GLUCOSE 135* 123* 190* 131* 100*  BUN 31* 36* 21* 30* 35*  CREATININE 6.55* 7.11* 4.76* 6.32* 6.85*  CALCIUM 8.9 8.5* 7.9* 8.5* 8.5*  PHOS 8.7* 9.5* 5.7*  --   --    Liver Function Tests: Recent Labs  Lab 06/04/23 0252 06/04/23 1419 06/05/23 1220  ALBUMIN 2.4* 2.1* 2.1*   Recent Labs  Lab 06/03/23 0905  LIPASE 24  AMYLASE 64   CBC: Recent Labs  Lab 06/04/23 0252 06/04/23 1419 06/05/23 1220 06/06/23 1415 06/07/23 0343  WBC 11.1* 11.0* 8.7 9.4 11.0*  HGB 9.3* 8.2* 7.8* 8.7* 8.4*  HCT 30.4* 27.4* 26.2* 29.5* 27.4*  MCV 98.4 100.0 100.0 100.3* 98.9  PLT 235 223 214 269 308   Blood Culture    Component Value Date/Time   SDES BLOOD BLOOD RIGHT HAND 02/17/2023 2243   SPECREQUEST  02/17/2023 2243    BOTTLES DRAWN AEROBIC AND ANAEROBIC Blood Culture results may not be optimal due to an inadequate volume of blood received in culture bottles    CULT  02/17/2023 2243    NO GROWTH 5 DAYS Performed at [facility], [address] [place], [place] [address]    REPTSTATUS 02/22/2023 FINAL 02/17/2023 2243    Cardiac Enzymes: No results for input(s): "CKTOTAL", "CKMB", "CKMBINDEX", "TROPONINI" in the last 168 hours. CBG: Recent Labs  Lab 06/06/23 0603 06/06/23 1136 06/06/23 1617 06/06/23 2105 06/07/23 0608  GLUCAP 121* 176* 141* 140* 115*   Iron Studies: No results for input(s): "IRON", "TIBC", "TRANSFERRIN", "FERRITIN" in the last 72 hours. @lablastinr3 @ Studies/Results: No results found. Medications:   (feeding supplement) PROSource Plus  30 mL Oral BID BM   acidophilus  1 capsule Oral TID WC   allopurinol  100 mg Oral BID   atorvastatin  10 mg Oral QPM   busPIRone  5 mg Oral TID   cholestyramine light  4 g Oral BID   darbepoetin (ARANESP) injection - DIALYSIS  200 mcg Subcutaneous Q Tue-1800   famotidine  20 mg Oral QAC breakfast   ferric citrate  210 mg Oral TID WC   folic acid  1 mg Oral Daily   Gerhardt's butt cream   Topical BID   heparin  5,000 Units Subcutaneous Q8H   hyoscyamine  0.125 mg Sublingual TID   insulin aspart  0-6 Units Subcutaneous TID WC   loperamide  2 mg Oral Q6H   metoprolol tartrate  12.5 mg Oral BID   midodrine  10 mg Oral Q T,Th,Sa-HD   mirtazapine  15 mg Oral QHS   pantoprazole  40 mg Oral BID   risperiDONE  1 mg Oral QHS    Outpatient Dialysis Orders:  TTS [facility] 4h   B400 101.5 kg 3K bath TDC   heparin none last HD 3/1 9, post weight 105.5 kg Not getting to dry weight, 4-8 kg over Mircera 225 mcg every 2 weeks Hectorol 4 mcg 3 times weekly  Assessment/Plan: Chronic diarrhea / hx of Cdif, taking po vanc. Per pmd ESRD: on HD TTS.  Continue TTS schedue Vascular access - Tpa dwelled and removed with good BFR for treatment.  She does have LUE AVF transposed 12/31/22 and should be able to use but she is reluctant.   Appears mature and ready for use. Reported pain in the  fistula yesterday, [place] is pending. Discussed risks of chronic TDC with pt. Will see if vascular can take a look at her fistula while she is here.  BP: chronic hypotension on midodrine at home. Cont midodrine.  Volume: she is above edw, however bp dropping with UF last HD. BP stable so far today, attempting UF with HD as tolerated Anemia of esrd: 8.4- overdue for ESA, ordered aranesp Secondary hyperparathyroidism: CCa in range, phos 5.7, cont binders ac.  Rogers Blocker, PA-C 06/07/2023, 11:08 AM  [facility] Pager: [phone]

## 2023-06-07 NOTE — Progress Notes (Signed)
   06/07/23 1330  Vitals  Temp 97.9 F (36.6 C)  Pulse Rate (!) 112  Resp (!) 23  BP 105/65  SpO2 98 %  O2 Device Room Air  Weight 108.1 kg  Post Treatment  Dialyzer Clearance Heavily streaked  Liters Processed 83.9  Fluid Removed (mL) 2000 mL  Tolerated HD Treatment Yes   Received patient in bed to unit.  Alert and oriented.  Informed consent signed and in chart.   TX duration:  Patient tolerated well.  Transported back to the room  Alert, without acute distress.  Hand-off given to patient's nurse.   Access used: Right chest HD catheter Access issues: None  Medication(s) given: 2mg  dilaudid, 25mg  atarax, 500mg  methocarbamol

## 2023-06-07 NOTE — Plan of Care (Signed)
  Problem: Coping: Goal: Ability to adjust to condition or change in health will improve Outcome: Not Progressing   Problem: Fluid Volume: Goal: Ability to maintain a balanced intake and output will improve Outcome: Not Progressing   Problem: Health Behavior/Discharge Planning: Goal: Ability to identify and utilize available resources and services will improve Outcome: Progressing Goal: Ability to manage health-related needs will improve Outcome: Progressing   Problem: Metabolic: Goal: Ability to maintain appropriate glucose levels will improve Outcome: Progressing   Problem: Nutritional: Goal: Maintenance of adequate nutrition will improve Outcome: Progressing

## 2023-06-07 NOTE — TOC Progression Note (Signed)
 Transition of Care [facility]) - Progression Note    Patient Details  Name: Deborah [name] MRN: [id number] Date of Birth: 03-Aug-1965  Transition of Care [facility]) CM/SW Contact  Michaela Corner, [place] Phone Number: 06/07/2023, 3:50 PM  Clinical Narrative:   CSW and RNCM met patient and her mother at bedside to discuss plans for discharge. Patients mother was reassuring patient that she needs to make a choice that will be good for her and also make a choice to ensure bed offers are not rescinded. Patient chose [facility] at this time, CSW reached out to admissions about bed availability. Per admissions, they will not have bed availability until Friday. CSW contacted MD and Renal Navigator to provide them with an update. Renal Navigator to work on changing HD center for patient.   TOC will continue to follow.    Expected Discharge Plan: Skilled Nursing Facility Barriers to Discharge: Continued Medical Work up, Other (must enter comment) (Awaiting PT/OT to see)  Expected Discharge Plan and Services In-house Referral: Clinical Social Work     Living arrangements for the past 2 months: Skilled Holiday representative                   DME Agency: NA                   Social Determinants of Health (SDOH) Interventions SDOH Screenings   Food Insecurity: No Food Insecurity (05/29/2023)  Housing: Patient Declined (05/29/2023)  Recent Concern: Housing - High Risk (04/25/2023)  Transportation Needs: Unmet Transportation Needs (05/29/2023)  Utilities: Not At Risk (05/29/2023)  Depression (PHQ2-9): Medium Risk (03/16/2023)  Financial Resource Strain: Low Risk (03/23/2021)   [facility])  Physical Activity: Not on File (10/31/2017)   Received from [place], [place]  Social Connections: Unknown (07/19/2021)   [facility]  Stress: Low Risk (03/23/2021)   [facility])  Tobacco Use:  Low Risk  (05/29/2023)    Readmission Risk Interventions    03/07/2023    4:19 PM 11/25/2022    5:16 PM 07/05/2022    1:20 PM  Readmission Risk Prevention Plan  Transportation Screening Complete Complete Complete  Medication Review (RN Care Manager) Complete Complete Referral to Pharmacy  PCP or Specialist appointment within 3-5 days of discharge Complete Complete Complete  HRI or Home Care Consult Complete Complete Complete  SW Recovery Care/Counseling Consult Complete Complete Complete  Palliative Care Screening Complete Not Applicable Not Applicable  Skilled Nursing Facility Not Applicable Not Applicable Not Applicable

## 2023-06-07 NOTE — Plan of Care (Signed)
  Problem: Coping: Goal: Level of anxiety will decrease Outcome: Not Progressing   

## 2023-06-07 NOTE — Progress Notes (Signed)
 PT Cancellation Note  Patient Details Name: Deborah [name] MRN: [id number] DOB: 26-Jan-1966   Cancelled Treatment:    Reason Eval/Treat Not Completed: Patient at procedure or test/unavailable Pt noted to be OTF at dialysis at this time. Will f/u as able.  Aleda Grana, PT, DPT 06/07/23, 8:58 AM   [name] 06/07/2023, 8:57 AM

## 2023-06-07 NOTE — Progress Notes (Signed)
 [facility]                                                                              [place], is a 58 y.o. female, DOB - 1965/09/01, ZOX:[id number] Admit date - 05/29/2023    Outpatient Primary MD for the patient is Pcp, No  LOS - 8  days  Chief Complaint  Patient presents with   Chest Pain   Abdominal Pain       Brief summary   Patient is a 58 yrs old female with ESRD on HD TTS, gastroparesis, GERD, PAF not on AC, diastolic CHF with preserved EF 55 to 60%, gout, duodenal carcinoid tumor, PVD s/p right-sided BKA, history of gastric AVM, NASH, depression, anxiety, obesity, restrictive lung disease, spinal stenosis, chronic pain syndrome,T2DM, chronic left heel diabetic ulcer, and history of chronic diarrhea in the setting of carcinoid tumor who presented to ED with complaints of diarrhea in setting of C.Diff and abdominal pain.  Patient has missed hemodialysis because of having intractable nausea and vomiting.  Patient has been taking oral vancomycin for C. Difficile+.  CT A&P no acute findings.  Patient is admitted for fluid overload due to missed hemodialysis due to diarrhea and intractable nausea and vomiting.    Assessment & Plan    Principal Problem: Acute on chronic abdominal pain, acute on chronic diarrhea with history of C. Difficile, nausea Gastroparesis - Patient reports history of chronic abdominal pain and diarrhea, presented with worsening diarrhea, abdominal pain (She was recently treated for C. difficile at her SNF). -CT abdomen/pelvis : No acute intraabdominal abnormality found. -C. difficile negative inpatient, GI pathogen panel negative - Previous CT scans did not show pancreatic issues.  Amylase 64, lipase 24 - Previous EGD in 04/2023 had shown gastritis, duodenitis, continue Protonix 40 mg twice daily -Continues to have abdominal pain, seen by GI, suspecting worsening gastroparesis in the setting of missed HD sessions -Continue  cholestyramine, scheduled Imodium, diarrhea improving -Continue Levsin (allergic to Bentyl) -Will avoid scheduled Zofran due to prolonged QTc.  She is tolerating diet.      Anemia of chronic disease/ESRD: Baseline Hgb 8-9 H&H stable  ESRD on hemodialysis, TTS -Nephrology following, continue HD per schedule   Acute on chronic diastolic CHF: Volume overloaded as she missed her dialysis x 2 -Volume management with HD   Chronic hypokalemia: Likely secondary to diarrhea -Replace as needed   Chronic pain syndrome/chronic abdominal pain:  -Concern for narcotic seeking behavior Continue IV Dilaudid as needed for pain. Continue home Robaxin as needed and gabapentin     Insulin dependent type 2 diabetes mellitus (HCC) Hb A1C of 5.7 in 01/2023 Continue sensitive SSI  CBG (last 3)  Recent Labs    06/06/23 1617 06/06/23 2105 06/07/23 0608  GLUCAP 141* 140* 115*     Paroxysmal atrial fibrillation (HCC): HR controlled, continue metoprolol No longer on Eliquis due to GI bleeding in 2024 October.    Malignant carcinoid tumor of duodenum (HCC) S/p excision    GERD (gastroesophageal reflux disease) Continue PPI BID   Chronic depression/anxiety Continue remeron, buspar and hydroxyzine    Chronic gout Continue allopurinol 100  mg twice daily.    Chronic wound of left heel: Wound care consult.   Obesity class II Estimated body mass index is 39.18 kg/m (pended) as calculated from the following:   Height as of this encounter: (P) 5\' 6"  (1.676 m).   Weight as of this encounter: 110.1 kg.  Code Status: Full code DVT Prophylaxis:  heparin injection 5,000 Units Start: 05/29/23 1400   Level of Care: Level of care: Telemetry Cardiac Family Communication: Updated patient Disposition Plan:      Remains inpatient appropriate: Awaiting SNF   Procedures:  Hemodialysis  Consultants:   Nephrology GI  Antimicrobials:   Anti-infectives (From admission, onward)    None           Medications  (feeding supplement) PROSource Plus  30 mL Oral BID BM   acidophilus  1 capsule Oral TID WC   allopurinol  100 mg Oral BID   atorvastatin  10 mg Oral QPM   busPIRone  5 mg Oral TID   cholestyramine light  4 g Oral BID   darbepoetin (ARANESP) injection - DIALYSIS  200 mcg Subcutaneous Q Tue-1800   famotidine  20 mg Oral QAC breakfast   ferric citrate  210 mg Oral TID WC   folic acid  1 mg Oral Daily   Gerhardt's butt cream   Topical BID   heparin  5,000 Units Subcutaneous Q8H   hyoscyamine  0.125 mg Sublingual TID   insulin aspart  0-6 Units Subcutaneous TID WC   loperamide  2 mg Oral Q6H   metoprolol tartrate  12.5 mg Oral BID   midodrine  10 mg Oral Q T,Th,Sa-HD   mirtazapine  15 mg Oral QHS   pantoprazole  40 mg Oral BID   risperiDONE  1 mg Oral QHS      Subjective:   Hila Bolding was seen and examined today.  States having some anxiety issues otherwise abdominal pain and diarrhea improving.  Tolerating diet.  No fevers or chills.    Objective:   Vitals:   06/07/23 0600 06/07/23 0753 06/07/23 0909 06/07/23 0920  BP:  121/77 122/60 127/65  Pulse:  (!) 111 (!) 110 (!) 109  Resp: 16  12 16   Temp:  99.2 F (37.3 C) 97.9 F (36.6 C)   TempSrc:  Oral    SpO2:   99% 100%  Weight:   110.1 kg   Height:        Intake/Output Summary (Last 24 hours) at 06/07/2023 1301 Last data filed at 06/07/2023 1610 Gross per 24 hour  Intake 540 ml  Output --  Net 540 ml     Wt Readings from Last 3 Encounters:  06/07/23 110.1 kg  05/03/23 102.8 kg  03/22/23 113.4 kg   Physical Exam General: Alert and oriented x 3, NAD Cardiovascular: S1 S2 clear, RRR.  Respiratory: CTAB, no wheezing Gastrointestinal: Soft, mild TTP diffusely , nondistended, NBS Ext: no pedal edema bilaterally Neuro: no new deficits Psych: Normal affect      Data Reviewed:  I have personally reviewed following labs    CBC Lab Results  Component Value Date   WBC 11.0  (H) 06/07/2023   RBC 2.77 (L) 06/07/2023   HGB 8.4 (L) 06/07/2023   HCT 27.4 (L) 06/07/2023   MCV 98.9 06/07/2023   MCH 30.3 06/07/2023   PLT 308 06/07/2023   MCHC 30.7 06/07/2023   RDW 15.0 06/07/2023   LYMPHSABS 2.6 05/29/2023   MONOABS 0.6 05/29/2023   EOSABS 0.3  05/29/2023   BASOSABS 0.1 05/29/2023     Last metabolic panel Lab Results  Component Value Date   NA 138 06/07/2023   K 4.4 06/07/2023   CL 102 06/07/2023   CO2 21 (L) 06/07/2023   BUN 35 (H) 06/07/2023   CREATININE 6.85 (H) 06/07/2023   GLUCOSE 100 (H) 06/07/2023   GFRNONAA 6 (L) 06/07/2023   GFRAA 49 (L) 11/30/2019   CALCIUM 8.5 (L) 06/07/2023   PHOS 5.7 (H) 06/05/2023   PROT 6.5 05/29/2023   ALBUMIN 2.1 (L) 06/05/2023   BILITOT 0.9 05/29/2023   ALKPHOS 101 05/29/2023   AST 14 (L) 05/29/2023   ALT 11 05/29/2023   ANIONGAP 15 06/07/2023    CBG (last 3)  Recent Labs    06/06/23 1617 06/06/23 2105 06/07/23 0608  GLUCAP 141* 140* 115*      Coagulation Profile: No results for input(s): "INR", "PROTIME" in the last 168 hours.   Radiology Studies: I have personally reviewed the imaging studies  No results found.     Thad Ranger M.D. [facility] 06/07/2023, 1:01 PM  Available via Epic secure chat 7am-7pm After 7 pm, please refer to night coverage provider listed on amion.

## 2023-06-08 DIAGNOSIS — T82898A Other specified complication of vascular prosthetic devices, implants and grafts, initial encounter: Secondary | ICD-10-CM

## 2023-06-08 DIAGNOSIS — C7A010 Malignant carcinoid tumor of the duodenum: Secondary | ICD-10-CM

## 2023-06-08 DIAGNOSIS — K3184 Gastroparesis: Secondary | ICD-10-CM

## 2023-06-08 DIAGNOSIS — Z9889 Other specified postprocedural states: Secondary | ICD-10-CM

## 2023-06-08 DIAGNOSIS — E1122 Type 2 diabetes mellitus with diabetic chronic kidney disease: Secondary | ICD-10-CM

## 2023-06-08 DIAGNOSIS — M79622 Pain in left upper arm: Secondary | ICD-10-CM

## 2023-06-08 DIAGNOSIS — K219 Gastro-esophageal reflux disease without esophagitis: Secondary | ICD-10-CM

## 2023-06-08 DIAGNOSIS — I480 Paroxysmal atrial fibrillation: Secondary | ICD-10-CM | POA: Diagnosis not present

## 2023-06-08 DIAGNOSIS — Z992 Dependence on renal dialysis: Secondary | ICD-10-CM

## 2023-06-08 DIAGNOSIS — Z79899 Other long term (current) drug therapy: Secondary | ICD-10-CM

## 2023-06-08 DIAGNOSIS — T148XXA Other injury of unspecified body region, initial encounter: Secondary | ICD-10-CM

## 2023-06-08 DIAGNOSIS — F32A Depression, unspecified: Secondary | ICD-10-CM

## 2023-06-08 DIAGNOSIS — Z794 Long term (current) use of insulin: Secondary | ICD-10-CM

## 2023-06-08 DIAGNOSIS — N186 End stage renal disease: Secondary | ICD-10-CM

## 2023-06-08 DIAGNOSIS — I5033 Acute on chronic diastolic (congestive) heart failure: Secondary | ICD-10-CM | POA: Diagnosis not present

## 2023-06-08 DIAGNOSIS — M1A9XX0 Chronic gout, unspecified, without tophus (tophi): Secondary | ICD-10-CM

## 2023-06-08 LAB — BASIC METABOLIC PANEL WITH GFR
Anion gap: 10 (ref 5–15)
BUN: 22 mg/dL — ABNORMAL HIGH (ref 6–20)
CO2: 26 mmol/L (ref 22–32)
Calcium: 8.5 mg/dL — ABNORMAL LOW (ref 8.9–10.3)
Chloride: 98 mmol/L (ref 98–111)
Creatinine, Ser: 4.6 mg/dL — ABNORMAL HIGH (ref 0.44–1.00)
GFR, Estimated: 10 mL/min — ABNORMAL LOW (ref >60)
Glucose, Bld: 201 mg/dL — ABNORMAL HIGH (ref 70–99)
Potassium: 3.8 mmol/L (ref 3.5–5.1)
Sodium: 134 mmol/L — ABNORMAL LOW (ref 135–145)

## 2023-06-08 LAB — GLUCOSE, CAPILLARY
Glucose-Capillary: 173 mg/dL — ABNORMAL HIGH (ref 70–99)
Glucose-Capillary: 178 mg/dL — ABNORMAL HIGH (ref 70–99)
Glucose-Capillary: 168 mg/dL — ABNORMAL HIGH (ref 70–99)
Glucose-Capillary: 189 mg/dL — ABNORMAL HIGH (ref 70–99)

## 2023-06-08 LAB — CBC
HCT: 28.8 % — ABNORMAL LOW (ref 36.0–46.0)
Hemoglobin: 8.5 g/dL — ABNORMAL LOW (ref 12.0–15.0)
MCH: 29.2 pg (ref 26.0–34.0)
MCHC: 29.5 g/dL — ABNORMAL LOW (ref 30.0–36.0)
MCV: 99 fL (ref 80.0–100.0)
Platelets: 256 10*3/uL (ref 150–400)
RBC: 2.91 MIL/uL — ABNORMAL LOW (ref 3.87–5.11)
RDW: 14.9 % (ref 11.5–15.5)
WBC: 9.8 10*3/uL (ref 4.0–10.5)
nRBC: 0 % (ref 0.0–0.2)

## 2023-06-08 LAB — HEPATITIS B CORE ANTIBODY, TOTAL: HEP B CORE AB: POSITIVE — AB

## 2023-06-08 MED ORDER — DARBEPOETIN ALFA 200 MCG/0.4ML IJ SOSY
200.0000 ug | PREFILLED_SYRINGE | INTRAMUSCULAR | Status: DC
  Administered 2023-06-08 – 2023-06-15 (×2): 200 ug via SUBCUTANEOUS
  Filled 2023-06-08 (×3): qty 0.4

## 2023-06-08 MED ORDER — DICLOFENAC SODIUM 1 % EX GEL
2.0000 g | Freq: Four times a day (QID) | CUTANEOUS | Status: DC
  Administered 2023-06-08 – 2023-06-20 (×27): 2 g via TOPICAL
  Filled 2023-06-08 (×2): qty 100

## 2023-06-08 MED ORDER — CALCIUM CARBONATE ANTACID 500 MG PO CHEW
1.0000 | CHEWABLE_TABLET | Freq: Two times a day (BID) | ORAL | Status: DC | PRN
  Administered 2023-06-08: 200 mg via ORAL
  Filled 2023-06-08 (×2): qty 1

## 2023-06-08 NOTE — Progress Notes (Signed)
 Contacted Kim with [facility] out-pt HD this morning regarding pt needing clinic placement at [facility] due to snf placement at [facility]. Clinicals faxed to Kim this morning for review. Will assist as needed.   Olivia Canter Renal Navigator [phone]

## 2023-06-08 NOTE — Consult Note (Addendum)
 Hospital Consult    Reason for Consult:  ESRD, evaluation of LUE AVF Requesting Physician:  Ferrel Logan MRN #:  [id number]  History of Present Illness: This is a 58 y.o. female with ESRD on HD TTS who vascular was consulted for evaluation of left upper extremity AV fistula. She is familiar to our practice for her ESRD as well as PAD. She has been dialyzing via Right internal jugular TDC. She has history of a left BC AV fistula placement in April of 2024 by Dr. Chestine Spore. On 12/31/22 she underwent revision of her left arm cephalic vein fistula with transposition by Dr. Randie Heinz. She denies any signs or symptoms of steal. She has been having about a 3 day history of left arm pain. She says this was part of what brought her to hospital. She recalls no injury. She is not sure why its hurting but says prior to this the fistula has not been bothering her. She has not been seen in follow up since. She has been reluctant to use her AV fistula. However in discussion with Nephrology she is understanding that she should try to transition to use of the fistula due to risks with chronic TDC.   Past Medical History:  Diagnosis Date   Acute back pain with sciatica, left    Acute back pain with sciatica, right    Acute encephalopathy 05/29/2022   Acute osteomyelitis of right calcaneus (HCC) 12/21/2022   Anemia, unspecified    Atrial fibrillation with RVR (HCC)-resolved 09/09/2022   Atypical chest pain 09/10/2021   Cancer (HCC)    Carcinoid tumor of duodenum    Chest pain with normal coronary angiography 2019   Chronic a-fib (HCC) 09/09/2022   Chronic pain    Chronic systolic CHF (congestive heart failure) (HCC)    Dehiscence of amputation stump of right lower extremity (HCC) 01/29/2023   Diabetes mellitus    DKA (diabetic ketoacidosis) (HCC)    Drug-seeking behavior    21 hospitalizations and 14 CT a/p in 2 years for N/V and abdominal pain, demanding only IV dilaudid   Elevated troponin    chronic    Esophageal reflux    Fibromyalgia    Gastric ulcer    Gastroparesis    Gout    HCAP (healthcare-associated pneumonia) 06/19/2022   Hyperlipidemia    Hyperosmolar hyperglycemic state (HHS) (HCC) 05/11/2022   Hyperosmolar non-ketotic state due to type 2 diabetes mellitus (HCC) 05/11/2022   Hypertension    Hypomagnesemia    Lumbosacral stenosis    LVH (left ventricular hypertrophy)    Morbid obesity (HCC)    Nausea & vomiting 09/09/2022   NICM (nonischemic cardiomyopathy) (HCC)    PAF (paroxysmal atrial fibrillation) (HCC)    Sepsis (HCC) 11/23/2022   Stroke (HCC) 02/2011   Symptomatic anemia 12/14/2022   Thrombocytosis    Vitamin B12 deficiency anemia     Past Surgical History:  Procedure Laterality Date   ABDOMINAL AORTOGRAM W/LOWER EXTREMITY N/A 11/29/2022   Procedure: ABDOMINAL AORTOGRAM W/LOWER EXTREMITY;  Surgeon: Daria Pastures, MD;  Location: [facility];  Service: Cardiovascular;  Laterality: N/A;   AMPUTATION Right 12/29/2022   Procedure: RIGHT BELOW KNEE AMPUTATION;  Surgeon: Nadara Mustard, MD;  Location: [facility] OR;  Service: Orthopedics;  Laterality: Right;   AV FISTULA PLACEMENT Left 06/30/2022   Procedure: LEFT BRACHIOCEPHALIC ARTERIOVENOUS (AV) FISTULA CREATION;  Surgeon: Cephus Shelling, MD;  Location: [facility] OR;  Service: Vascular;  Laterality: Left;   BIOPSY  07/27/2019  Procedure: BIOPSY;  Surgeon: Vida Rigger, MD;  Location: [facility];  Service: Endoscopy;;   BIOPSY  07/30/2019   Procedure: BIOPSY;  Surgeon: Kathi Der, MD;  Location: [facility] ENDOSCOPY;  Service: Gastroenterology;;   BIOPSY  04/28/2023   Procedure: BIOPSY;  Surgeon: Imogene Burn, MD;  Location: [facility] ENDOSCOPY;  Service: Gastroenterology;;   CATARACT EXTRACTION  01/2014   CHOLECYSTECTOMY     COLONOSCOPY WITH PROPOFOL N/A 07/30/2019   Procedure: COLONOSCOPY WITH PROPOFOL;  Surgeon: Kathi Der, MD;  Location: [facility] ENDOSCOPY;  Service: Gastroenterology;  Laterality: N/A;    ESOPHAGOGASTRODUODENOSCOPY N/A 07/27/2019   Procedure: ESOPHAGOGASTRODUODENOSCOPY (EGD);  Surgeon: Vida Rigger, MD;  Location: [facility];  Service: Endoscopy;  Laterality: N/A;   ESOPHAGOGASTRODUODENOSCOPY N/A 07/26/2020   Procedure: ESOPHAGOGASTRODUODENOSCOPY (EGD);  Surgeon: Willis Modena, MD;  Location: [facility];  Service: Endoscopy;  Laterality: N/A;   ESOPHAGOGASTRODUODENOSCOPY (EGD) WITH PROPOFOL N/A 08/02/2019   Procedure: ESOPHAGOGASTRODUODENOSCOPY (EGD) WITH PROPOFOL;  Surgeon: Kathi Der, MD;  Location: [facility] ENDOSCOPY;  Service: Gastroenterology;  Laterality: N/A;   ESOPHAGOGASTRODUODENOSCOPY (EGD) WITH PROPOFOL N/A 12/23/2022   Procedure: ESOPHAGOGASTRODUODENOSCOPY (EGD) WITH PROPOFOL;  Surgeon: Shellia Cleverly, DO;  Location: [facility] ENDOSCOPY;  Service: Gastroenterology;  Laterality: N/A;   ESOPHAGOGASTRODUODENOSCOPY (EGD) WITH PROPOFOL N/A 04/28/2023   Procedure: ESOPHAGOGASTRODUODENOSCOPY (EGD) WITH PROPOFOL;  Surgeon: Imogene Burn, MD;  Location: [facility] ENDOSCOPY;  Service: Gastroenterology;  Laterality: N/A;   FISTULA SUPERFICIALIZATION Left 12/31/2022   Procedure: LEFT ARM FISTULA TRANSPOSITION;  Surgeon: Maeola Harman, MD;  Location: [facility] OR;  Service: Vascular;  Laterality: Left;   GIVENS CAPSULE STUDY N/A 12/23/2022   Procedure: GIVENS CAPSULE STUDY;  Surgeon: Shellia Cleverly, DO;  Location: [facility] ENDOSCOPY;  Service: Gastroenterology;  Laterality: N/A;   HEMOSTASIS CLIP PLACEMENT  08/02/2019   Procedure: HEMOSTASIS CLIP PLACEMENT;  Surgeon: Kathi Der, MD;  Location: [facility] ENDOSCOPY;  Service: Gastroenterology;;   HOT HEMOSTASIS N/A 12/23/2022   Procedure: HOT HEMOSTASIS (ARGON PLASMA COAGULATION/BICAP);  Surgeon: Shellia Cleverly, DO;  Location: [facility] ENDOSCOPY;  Service: Gastroenterology;  Laterality: N/A;   IR FLUORO GUIDE CV LINE RIGHT  06/24/2022   IR US GUIDE VASC ACCESS RIGHT  06/24/2022   POLYPECTOMY  07/30/2019   Procedure: POLYPECTOMY;  Surgeon:  Kathi Der, MD;  Location: [facility] ENDOSCOPY;  Service: Gastroenterology;;   POLYPECTOMY  08/02/2019   Procedure: POLYPECTOMY;  Surgeon: Kathi Der, MD;  Location: [facility];  Service: Gastroenterology;;   Gaspar Bidding DILATION N/A 04/28/2023   Procedure: Gaspar Bidding DILATION;  Surgeon: Imogene Burn, MD;  Location: [facility] ENDOSCOPY;  Service: Gastroenterology;  Laterality: N/A;   STUMP REVISION Right 02/02/2023   Procedure: REVISION RIGHT BELOW KNEE AMPUTATION;  Surgeon: Nadara Mustard, MD;  Location: [facility] OR;  Service: Orthopedics;  Laterality: Right;    Allergies  Allergen Reactions   Gabapentin Hives and [name]   Isovue [Iopamidol] Anaphylaxis, [name] and Other (See Comments)    11/28/17 Patient had seizure like activity and then 1 min code after 100 cc of isovue 300. Possible contrast allergy vs vasovagal episode  Cardiac Arrest   Nsaids Anaphylaxis and Other (See Comments)    Hx of stomach ulcers   Penicillins Itching, Palpitations and Other (See Comments)    Flushing (Red Skin) Laryngeal Edema   Reglan [Metoclopramide] Other (See Comments)    Tardive dyskinesia    Valium [Diazepam] [name]   Zestril [Lisinopril] Anaphylaxis and Swelling    Tongue and mouth swelling Laryngeal Edema   Tolectin [Tolmetin]  Nausea And Vomiting, Nausea Only and Other (See Comments)    Irritates stomach ulcer   Asa [Aspirin] Other (See Comments)    Hx of stomach ulcer   Aspartame And Phenylalanine Hives   Bentyl [Dicyclomine] Other (See Comments)    Chest pain   Hibiclens [Chlorhexidine Gluconate] Other (See Comments)    Dermatitis    Flexeril [Cyclobenzaprine] Palpitations   Oxycontin [Oxycodone] Palpitations   Rifamycins Palpitations   Tylenol [Acetaminophen] Nausea And Vomiting, Nausea Only and Other (See Comments)    Irritates stomach ulcer Abdominal pain   Ultram [Tramadol] Nausea And Vomiting and Palpitations    Prior to Admission medications    Medication Sig Start Date End Date Taking? Authorizing Provider  albuterol (PROVENTIL) (2.5 MG/3ML) 0.083% nebulizer solution Take 3 mLs (2.5 mg total) by nebulization every 6 (six) hours as needed for wheezing or shortness of breath. 04/06/19  Yes Bing Neighbors, NP  allopurinol (ZYLOPRIM) 100 MG tablet Take 1 tablet (100 mg total) by mouth 2 (two) times daily. 11/30/22  Yes Azucena Fallen, MD  ascorbic acid (VITAMIN C) 500 MG tablet Take 500 mg by mouth daily.   Yes [provider]  atorvastatin (LIPITOR) 10 MG tablet Take 10 mg by mouth every evening. 12/03/21  Yes [provider]  busPIRone (BUSPAR) 5 MG tablet Take 1 tablet (5 mg total) by mouth 3 (three) times daily. 05/04/23  Yes Rodolph Bong, MD  cetirizine (ZYRTEC) 10 MG tablet Take 10 mg by mouth daily. 08/05/22  Yes [provider]  famotidine (PEPCID) 20 MG tablet Take 20 mg by mouth daily before breakfast.   Yes [provider]  ferric citrate (AURYXIA) 1 GM 210 MG(Fe) tablet Take 2 tablets (420 mg total) by mouth 3 (three) times daily with meals. Patient taking differently: Take 210 mg by mouth 3 (three) times daily with meals. 11/30/22  Yes Azucena Fallen, MD  fluticasone [facility]) 50 MCG/ACT nasal spray Place 2 sprays into both nostrils daily as needed for allergies or rhinitis. 12/19/18  Yes Rai, Ripudeep K, MD  folic acid (FOLVITE) 1 MG tablet Take 1 tablet (1 mg total) by mouth daily. 01/10/23  Yes Danford, Earl Lites, MD  hydrocortisone cream 1 % Apply 1 Application topically 2 (two) times daily.   Yes [provider]  HYDROmorphone (DILAUDID) 2 MG tablet Take 1 tablet (2 mg total) by mouth every 6 (six) hours as needed for severe pain (pain score 7-10) or moderate pain (pain score 4-6). 05/04/23  Yes Rodolph Bong, MD  hydrOXYzine (ATARAX) 25 MG tablet Take 1 tablet (25 mg total) by mouth 3 (three) times daily as needed for anxiety. Patient taking differently:  Take 25 mg by mouth See admin instructions. Give 25 mg (1 tablet) every 6 hours as needed for anxiety, agitation for 14 days (starting 11/19), then every 8 hours as needed. 01/10/23  Yes Danford, Earl Lites, MD  insulin lispro (HUMALOG) 100 UNIT/ML KwikPen Before each meal 3 times a day, 140-199 - 2 units, 200-250 - 6 units, 251-299 - 8 units,  300-349 - 12 units,  350 or above 14 units. Patient taking differently: Inject 0-14 Units into the skin See admin instructions. Inject 0-14 units per sliding scale before meals: < 70 notify MD 70-139 : 0 units 140-199 : 2 units 200-250 : 6 units 251-299 : 8 units 300-349 : 12 units 350-400 : 14 units > 401 notify MD. 06/04/22  Yes Leroy Sea, MD  Lactobacillus (  ACIDOPHILUS) CAPS capsule Take 1 capsule by mouth 3 (three) times daily with meals. 03/08/23  Yes Elgergawy, Leana Roe, MD  methocarbamol (ROBAXIN) 500 MG tablet Take 1 tablet (500 mg total) by mouth every 6 (six) hours as needed for muscle spasms. 01/10/23  Yes Danford, Earl Lites, MD  metoprolol tartrate (LOPRESSOR) 25 MG tablet Take 1 tablet (25 mg total) by mouth 2 (two) times daily. 03/08/23  Yes Elgergawy, Leana Roe, MD  midodrine (PROAMATINE) 10 MG tablet Take 1 tablet (10 mg total) by mouth Every Tuesday,Thursday,and Saturday with dialysis. 05/05/23  Yes Pola Corn, NP  midodrine (PROAMATINE) 10 MG tablet Take 10 mg by mouth 3 (three) times daily.   Yes [provider]  mirtazapine (REMERON) 15 MG tablet Take 15 mg by mouth at bedtime. 10/18/22  Yes [provider]  Multiple Vitamins-Minerals (SENIOR MULTIVITAMIN PLUS PO) Take 1 tablet by mouth daily.   Yes [provider]  Nutritional Supplements (,FEEDING SUPPLEMENT, PROSOURCE PLUS) liquid Take 30 mLs by mouth 2 (two) times daily between meals. 01/10/23  Yes Danford, Earl Lites, MD  ondansetron (ZOFRAN-ODT) 4 MG disintegrating tablet Take 1 tablet (4 mg total) by mouth every 8 (eight) hours as  needed for nausea or vomiting. 05/04/23  Yes Rodolph Bong, MD  pantoprazole (PROTONIX) 40 MG tablet Take 1 tablet (40 mg total) by mouth 2 (two) times daily. Patient taking differently: Take 40 mg by mouth daily. 02/18/23  Yes Raulkar, Drema Pry, MD  risperiDONE (RISPERDAL M-TABS) 1 MG disintegrating tablet Take 1 tablet (1 mg total) by mouth at bedtime. 05/04/23  Yes Rodolph Bong, MD  saccharomyces boulardii (FLORASTOR) 250 MG capsule Take 250 mg by mouth in the morning, at noon, and at bedtime.   Yes [provider]    Social History   Socioeconomic History   Marital status: Married    Spouse name: Not on file   Number of children: Not on file   Years of education: Not on file   Highest education level: Not on file  Occupational History   Not on file  Tobacco Use   Smoking status: Never   Smokeless tobacco: Never  Vaping Use   Vaping status: Never Used  Substance and Sexual Activity   Alcohol use: No   Drug use: No   Sexual activity: Not Currently    Birth control/protection: None  Other Topics Concern   Not on file  Social History Narrative   ** Merged History Encounter **       Social Drivers of Health   Financial Resource Strain: Low Risk (03/23/2021)   Received from [facility])   Financial Resource Strain  Food Insecurity: No Food Insecurity (05/29/2023)   Hunger Vital Sign    Worried About Running Out of Food in the Last Year: Never true    Ran Out of Food in the Last Year: Never true  Transportation Needs: Unmet Transportation Needs (05/29/2023)   PRAPARE - Transportation    Lack of Transportation (Medical): Yes    Lack of Transportation (Non-Medical): Yes  Physical Activity: Not on File (10/31/2017)   Received from [place], [place]   Physical Activity    Physical Activity: 0  Stress: Low Risk (03/23/2021)   Received from [facility])   Stress    Over the last 2 weeks, how often  have you been bothered by the following problems: feeling nervous, anxious, on edge?: Not at all  Over the last 2 weeks, how often have you been bothered by the following problems: Not being able to stop or control worrying?: Not at all  Social Connections: Unknown (07/19/2021)   Received from [facility]   Social Network    Social Network: Not on file  Intimate Partner Violence: Not At Risk (05/29/2023)   Humiliation, Afraid, Rape, and Kick questionnaire    Fear of Current or Ex-Partner: No    Emotionally Abused: No    Physically Abused: No    Sexually Abused: No     Family History  Problem Relation Age of Onset   Diabetes Mother    Diabetes Father    Heart disease Father    Diabetes Sister    Congestive Heart Failure Sister 53   Diabetes Brother     ROS: Otherwise negative unless mentioned in HPI  Physical Examination  Vitals:   06/07/23 2341 06/08/23 0258  BP: 93/62 (!) 92/58  Pulse: 95 (!) 104  Resp: 20 19  Temp: 98.6 F (37 C) 98.1 F (36.7 C)  SpO2: 98% 99%   Body mass index is 38.75 kg/m (pended).  General:  WDWN in NAD Gait: Not observed HENT: WNL, normocephalic Pulmonary: normal non-labored breathing Cardiac: regular Vascular Exam/Pulses: Left upper arm fistula with good thrill. Fistula is tortuous but palpable in left upper arm  Neurologic: A&O X 3 Psychiatric:  The pt has Normal affect.  CBC    Component Value Date/Time   WBC 9.8 06/08/2023 0248   RBC 2.91 (L) 06/08/2023 0248   HGB 8.5 (L) 06/08/2023 0248   HGB 8.8 (L) 10/09/2019 0906   HGB 9.6 (L) 09/03/2019 1620   HCT 28.8 (L) 06/08/2023 0248   HCT 30.4 (L) 09/03/2019 1620   PLT 256 06/08/2023 0248   PLT 348 10/09/2019 0906   PLT 451 (H) 09/03/2019 1620   MCV 99.0 06/08/2023 0248   MCV 85 09/03/2019 1620   MCH 29.2 06/08/2023 0248   MCHC 29.5 (L) 06/08/2023 0248   RDW 14.9 06/08/2023 0248   RDW 13.6 09/03/2019 1620   LYMPHSABS 2.6 05/29/2023 0301   LYMPHSABS 2.2  09/03/2019 1620   MONOABS 0.6 05/29/2023 0301   EOSABS 0.3 05/29/2023 0301   EOSABS 0.3 09/03/2019 1620   BASOSABS 0.1 05/29/2023 0301   BASOSABS 0.1 09/03/2019 1620    BMET    Component Value Date/Time   NA 134 (L) 06/08/2023 0248   NA 137 09/20/2019 1530   K 3.8 06/08/2023 0248   CL 98 06/08/2023 0248   CO2 26 06/08/2023 0248   GLUCOSE 201 (H) 06/08/2023 0248   BUN 22 (H) 06/08/2023 0248   BUN 18 09/20/2019 1530   CREATININE 4.60 (H) 06/08/2023 0248   CALCIUM 8.5 (L) 06/08/2023 0248   GFRNONAA 10 (L) 06/08/2023 0248   GFRAA 49 (L) 11/30/2019 0356    COAGS: Lab Results  Component Value Date   INR 1.5 (H) 03/22/2023   INR 1.9 (H) 12/15/2022   INR 1.8 (H) 11/23/2022     Non-Invasive Vascular Imaging:   None  Statin:  Yes.   Beta Blocker:  Yes.   Aspirin:  No. ACEI:  No. ARB:  No. CCB use:  No Other antiplatelets/anticoagulants:  No.    ASSESSMENT/PLAN: This is a 58 y.o. female with ESRD on HD TTS. She is currently dialyzing via a TDC. She has a functioning left upper extremity AV fistula. She has history of a left BC AV fistula placement in April of  2024 by Dr. Chestine Spore. On 12/31/22 she underwent revision of her left arm cephalic vein fistula with transposition by Dr. Randie Heinz. She is having some acute left upper arm pain but I doubt this is related to the fistula. She has no signs or symptoms of steal. From our standpoint her left BC fistula should be adequate for cannulation immediately now that patient is agreeable. The fistula clinically is tortuous which may make it challenging to cannulate. I have placed order for duplex to evaluate the fistula but this does not need to delay use. Please let us know if there are any issues with cannulation and we can reassess. Vascular otherwise will be available as needed   [facility] [phone] 06/08/2023  11:36 AM  I agree with the above.  I have seen and evaluated the patient.  We are  asked to evaluate her for left arm pain.  She is status post left brachiocephalic fistula in April 2024 and revision in October 2024.  She complains of left arm pain that begins at her shoulder and radiates down her arm.  She has a palpable thrill within her fistula.  She does not have any signs of steal.  I do not think that her fistula is the etiology of her shoulder and arm pain.  However she does have some mild left arm swelling and so we have ordered a duplex to make sure there are no areas of stenosis within her fistula.  Durene Cal

## 2023-06-08 NOTE — Progress Notes (Signed)
   06/08/23 1550  Assess: MEWS Score  BP (!) 94/43  Pulse Rate (!) 108 (HR elevated at times, since admission)  Resp 18  SpO2 100 %  O2 Device Room Air  Assess: MEWS Score  MEWS Temp 0  MEWS Systolic 1  MEWS Pulse 1  MEWS RR 0  MEWS LOC 0  MEWS Score 2  MEWS Score Color Yellow  Assess: if the MEWS score is Yellow or Red  Were vital signs accurate and taken at a resting state? Yes  Does the patient meet 2 or more of the SIRS criteria? No  Assess: SIRS CRITERIA  SIRS Temperature  0  SIRS Respirations  0  SIRS Pulse 1  SIRS WBC 0  SIRS Score Sum  1

## 2023-06-08 NOTE — Care Management Important Message (Signed)
 Important Message  Patient Details  Name: Deborah [name] MRN: [id number] Date of Birth: 10-22-1965   Important Message Given:  Yes - Medicare IM     Renie Ora 06/08/2023, 10:21 AM

## 2023-06-08 NOTE — Hospital Course (Addendum)
 Deborah [name] was admitted to the hospital with the working diagnosis of acute on chronic gastroparesis.   58 yrs old female with ESRD on HD TTS, gastroparesis, GERD, PAF not on AC, diastolic CHF with preserved EF 55 to 60%, gout, duodenal carcinoid tumor, PVD s/p right-sided BKA, history of gastric AVM, NASH, depression, anxiety, obesity, restrictive lung disease, spinal stenosis, chronic pain syndrome,T2DM, chronic left heel diabetic ulcer, and history of chronic diarrhea in the setting of carcinoid tumor who presented to ED with complaints of diarrhea in setting of C.Diff and abdominal pain.  Patient has missed hemodialysis because of having intractable nausea and vomiting.  Patient has been taking oral vancomycin for C. Difficile+.   On her initial physical examination her blood pressure was 131/71, HR 89, RR 16 and 02 saturation 100%. Lungs with no wheezing or rales, no rhonchi, heart with S1 and S2 present and regular with no gallops, rubs or murmurs, abdomen protuberant, with generalized tenderness to palpation, with no rebound or guarding, positive lower extremity edema, right BKA.   CT A&P no acute findings.    Patient is admitted for fluid overload due to missed hemodialysis due to diarrhea and intractable nausea and vomiting.   04/04 decrease frequency of IV hydromorphone. Pending placement in SNF\ 04/07 consulted psychiatry   04/08 plan to transition to po hydromorphone tomorrow afternoon.

## 2023-06-08 NOTE — Progress Notes (Signed)
 PT Cancellation Note  Patient Details Name: Deborah [name] MRN: [id number] DOB: Aug 01, 1965   Cancelled Treatment:    Reason Eval/Treat Not Completed: Patient declined, no reason specified. Pt declined PT due to generalized pain and feeling SOB (SpO2 100% on RA). Pt began crying and mentioned feeling "tired of it all". Pt stated she was having thoughts of hurting herself with RN notified and in room. Acute PT to follow and re-attempt as appropriate.   Hilton Cork, PT, DPT Secure Chat Preferred  Rehab Office [phone]   Arturo Morton Brion [facility] 06/08/2023, 11:50 AM

## 2023-06-08 NOTE — Progress Notes (Addendum)
 Progress Note   Patient: Deborah [name] UJW:[id number] DOB: April 24, 1965 DOA: 05/29/2023     9 DOS: the patient was seen and examined on 06/08/2023   Brief hospital course: Deborah [name] was admitted to the hospital with the working diagnosis of acute on chronic gastroparesis.   58 yrs old female with ESRD on HD TTS, gastroparesis, GERD, PAF not on AC, diastolic CHF with preserved EF 55 to 60%, gout, duodenal carcinoid tumor, PVD s/p right-sided BKA, history of gastric AVM, NASH, depression, anxiety, obesity, restrictive lung disease, spinal stenosis, chronic pain syndrome,T2DM, chronic left heel diabetic ulcer, and history of chronic diarrhea in the setting of carcinoid tumor who presented to ED with complaints of diarrhea in setting of C.Diff and abdominal pain.  Patient has missed hemodialysis because of having intractable nausea and vomiting.  Patient has been taking oral vancomycin for C. Difficile+.  CT A&P no acute findings.  Patient is admitted for fluid overload due to missed hemodialysis due to diarrhea and intractable nausea and vomiting.   Assessment and Plan: * Gastroparesis Gastrointestinal symptoms are improving.  Today with no vomiting and nausea has improved.   Plan to continue supportive medical therapy with as needed antiemetics.  Scheduled antiacids.   ESRD on dialysis [facility]) Patient with signs of volume overload, she has peripheral edema.  She has been not compliant with NaCl restricted diet.  Continue inpatient renal replacement therapy per nephrology recommendations.  She has left upper extremity fistula with appropriate functioning.    Acute on chronic diastolic CHF (congestive heart failure) (HCC) Volume overload in the setting of ESRD. Patient not compliant with HD as outpatient.  Plan to continue ultrafiltration on HD.   Continue blood pressure support with midodrine. Continue metoprolol.    Paroxysmal atrial fibrillation (HCC) Continue metoprolol for  rate control.  Patient is off anticoagulation with to  history of GI bleeding in 10/24   Chronic pain syndrome/chronic abdominal pain Persistent pain, today more on the left side.   Plan to continue analgesics with IV hydromorphone and oral muscle relaxants.  Will add topical diclofenac to left shoulder.  Continue working with physical therapy/.   Insulin dependent type 2 diabetes mellitus (HCC) Continue glucose cover and monitoring with insulin sliding scale  Patient is having better toleration to po.   Chronic diarrhea with history of C.Diff Diarrhea has resolved.   Malignant carcinoid tumor of duodenum (HCC) S/p excision  Follow up as outpatient.   GERD (gastroesophageal reflux disease) Continue antiacid therapy. As needed antiemetics.   Chronic depression/anxiety Continue remeron, buspar and hydroxyzine   Chronic gout -Continue allopurinol 100 mg twice daily.   Chronic wound of left heel Wound consult         Subjective: patient continue to have edema, she has not been compliant with salt restricted diet. Reports pain on her left side, from shoulder down to abdomen, worse with movement, improved with IV analgesics. Nausea has improved, and she has not being vomiting.   Physical Exam: Vitals:   06/08/23 0258 06/08/23 0500 06/08/23 1206 06/08/23 1550  BP: (!) 92/58  108/74 (!) 94/43  Pulse: (!) 104  (!) 101 (!) 108  Resp: 19  18   Temp: 98.1 F (36.7 C)  98.5 F (36.9 C)   TempSrc: Oral  Oral Oral  SpO2: 99%  100%   Weight:  108.9 kg    Height:       Neurology awake and alert, deconditioned ENT with mild pallor with no icterus Cardiovascular with  S1 and S2 present and regular with no gallops, rubs or murmurs No JVD Respiratory with no rales or wheezing, no rhonchi Abdomen protuberant, soft to palpation with no rebound or guarding  Lower extremity left with edema +++ right BKA +++ edema.   Data Reviewed:    Family Communication: I spoke with  patient's mother at the bedside, we talked in detail about patient's condition, plan of care and prognosis and all questions were addressed.   Disposition: Status is: Inpatient Remains inpatient appropriate because: volume overload   Planned Discharge Destination: Home    Author: Coralie Keens, MD 06/08/2023 4:04 PM  For on call review [email or web address].

## 2023-06-08 NOTE — Progress Notes (Signed)
 Pt states that no one from IV team to give her a new PIV Met IV team RN in the hallway and discussed pt's new PIV New PIV is labeled with today's date  Pt also states that she can't use her arms Observed to be cleaning off table, and turning self independently in the bed by pulling herself up on the bedrails  Pt states that she has not received her dilaudid Showed patient the empty syringes in the trash can  Left thoracic pain under left breast persistent, consistent since admission

## 2023-06-08 NOTE — Progress Notes (Signed)
 [facility] Progress Note   Subjective:   Reports ongoing pain in her rib area under her left breast. Denies SOB, cough, CP, dizziness, nausea.   Objective Vitals:   06/07/23 2021 06/07/23 2341 06/08/23 0258 06/08/23 0500  BP: 107/66 93/62 (!) 92/58   Pulse: (!) 110 95 (!) 104   Resp: 20 20 19    Temp: 98.2 F (36.8 C) 98.6 F (37 C) 98.1 F (36.7 C)   TempSrc: Oral Oral Oral   SpO2: 100% 98% 99%   Weight:    108.9 kg  Height:       Physical Exam General: Alert female in NAD Heart: RRR, no murmurs, rubs or gallops Lungs: CTA bilaterally Abdomen: Soft, non-distended, +BS Extremities: 2+ edema LLE, R BKA Dialysis Access:  LUE AVF +t/b, [facility]   Additional Objective Labs: Basic Metabolic Panel: Recent Labs  Lab 06/04/23 0252 06/04/23 1419 06/05/23 1220 06/06/23 1415 06/07/23 0343 06/08/23 0248  NA 135 134* 134* 139 138 134*  K 4.6 4.0 3.6 4.0 4.4 3.8  CL 101 101 100 103 102 98  CO2 21* 21* 25 23 21* 26  GLUCOSE 135* 123* 190* 131* 100* 201*  BUN 31* 36* 21* 30* 35* 22*  CREATININE 6.55* 7.11* 4.76* 6.32* 6.85* 4.60*  CALCIUM 8.9 8.5* 7.9* 8.5* 8.5* 8.5*  PHOS 8.7* 9.5* 5.7*  --   --   --    Liver Function Tests: Recent Labs  Lab 06/04/23 0252 06/04/23 1419 06/05/23 1220  ALBUMIN 2.4* 2.1* 2.1*   Recent Labs  Lab 06/03/23 0905  LIPASE 24  AMYLASE 64   CBC: Recent Labs  Lab 06/04/23 1419 06/05/23 1220 06/06/23 1415 06/07/23 0343 06/08/23 0248  WBC 11.0* 8.7 9.4 11.0* 9.8  HGB 8.2* 7.8* 8.7* 8.4* 8.5*  HCT 27.4* 26.2* 29.5* 27.4* 28.8*  MCV 100.0 100.0 100.3* 98.9 99.0  PLT 223 214 269 308 256   Blood Culture    Component Value Date/Time   SDES BLOOD BLOOD RIGHT HAND 02/17/2023 2243   SPECREQUEST  02/17/2023 2243    BOTTLES DRAWN AEROBIC AND ANAEROBIC Blood Culture results may not be optimal due to an inadequate volume of blood received in culture bottles   CULT  02/17/2023 2243    NO GROWTH 5 DAYS Performed at [facility], [address], [place], [place] [address]    REPTSTATUS 02/22/2023 FINAL 02/17/2023 2243    Cardiac Enzymes: No results for input(s): "CKTOTAL", "CKMB", "CKMBINDEX", "TROPONINI" in the last 168 hours. CBG: Recent Labs  Lab 06/06/23 2105 06/07/23 0608 06/07/23 1629 06/07/23 2116 06/08/23 0601  GLUCAP 140* 115* 137* 180* 173*   Iron Studies: No results for input(s): "IRON", "TIBC", "TRANSFERRIN", "FERRITIN" in the last 72 hours. @lablastinr3 @ Studies/Results: VAS [place] UPPER EXTREMITY VENOUS DUPLEX Result Date: 06/07/2023 UPPER VENOUS STUDY  Patient Name:  Deborah [name]  Date of Exam:   06/07/2023 Medical Rec #: [id number]             Accession #:    [id number] Date of Birth: November 30, 1965             Patient Gender: F Patient Age:   58 years Exam Location:  [facility] Procedure:      VAS [place] UPPER EXTREMITY VENOUS DUPLEX Referring Phys: RIPUDEEP RAI --------------------------------------------------------------------------------  Indications: Swelling Other Indications: Fistula, H/O stroke, PAF. Comparison Study: Previous study on 1.16.2023. Performing Technologist: Fernande Bras  Examination Guidelines: A complete evaluation includes B-mode imaging, spectral Doppler, color Doppler,  and power Doppler as needed of all accessible portions of each vessel. Bilateral testing is considered an integral part of a complete examination. Limited examinations for reoccurring indications may be performed as noted.  Right Findings: +----------+------------+---------+-----------+----------+-------+ RIGHT     CompressiblePhasicitySpontaneousPropertiesSummary +----------+------------+---------+-----------+----------+-------+ Subclavian               Yes       Yes                      +----------+------------+---------+-----------+----------+-------+  Left Findings: +----------+------------+---------+-----------+----------+-------+ LEFT       CompressiblePhasicitySpontaneousPropertiesSummary +----------+------------+---------+-----------+----------+-------+ IJV           Full       Yes       Yes                      +----------+------------+---------+-----------+----------+-------+ Subclavian    Full       Yes       Yes                      +----------+------------+---------+-----------+----------+-------+ Axillary      Full       Yes       Yes                      +----------+------------+---------+-----------+----------+-------+ Brachial      Full       Yes       Yes                      +----------+------------+---------+-----------+----------+-------+ Radial        Full                                          +----------+------------+---------+-----------+----------+-------+ Ulnar         Full                                          +----------+------------+---------+-----------+----------+-------+ Cephalic      Full       Yes       Yes                      +----------+------------+---------+-----------+----------+-------+ Basilic       Full       Yes       Yes                      +----------+------------+---------+-----------+----------+-------+ Left upper extremity fistula appears patent.  Summary:  Right: No evidence of thrombosis in the subclavian.  Left: No evidence of deep vein thrombosis in the upper extremity. No evidence of superficial vein thrombosis in the upper extremity.  *See table(s) above for measurements and observations.    Preliminary    Medications:   (feeding supplement) PROSource Plus  30 mL Oral BID BM   acidophilus  1 capsule Oral TID WC   allopurinol  100 mg Oral BID   atorvastatin  10 mg Oral QPM   busPIRone  5 mg Oral TID   cholestyramine light  4 g Oral BID   darbepoetin (ARANESP) injection - DIALYSIS  200 mcg Subcutaneous Q Wed-1800   famotidine  20 mg Oral QAC breakfast  ferric citrate  210 mg Oral TID WC   folic acid  1 mg Oral Daily    Gerhardt's butt cream   Topical BID   heparin  5,000 Units Subcutaneous Q8H   hyoscyamine  0.125 mg Sublingual TID   insulin aspart  0-6 Units Subcutaneous TID WC   loperamide  2 mg Oral Q6H   metoprolol tartrate  12.5 mg Oral BID   midodrine  10 mg Oral Q T,Th,Sa-HD   mirtazapine  15 mg Oral QHS   pantoprazole  40 mg Oral BID   risperiDONE  1 mg Oral QHS    Outpatient Dialysis Orders:  TTS South 4h   B400 101.5 kg 3K bath TDC   heparin none last HD 3/1 9, post weight 105.5 kg Not getting to dry weight, 4-8 kg over Mircera 225 mcg every 2 weeks Hectorol 4 mcg 3 times weekly  Assessment/Plan: Chronic diarrhea / hx of Cdif, taking po vanc. Per pmd ESRD: on HD TTS.  Continue TTS schedue Vascular access - Tpa dwelled and removed with good BFR for treatment.  She does have LUE AVF transposed 12/31/22 and should be able to use but she is reluctant.   Appears mature and ready for use. Reported pain in the fistula yesterday, US shows patient fistula, edema appears improved. Pain seems more related to her ribs/L side than her arm. Discussed risks of chronic TDC with pt. Will see if vascular can take a look at her fistula while she is here.  BP: chronic hypotension on midodrine at home. Cont midodrine.  Volume: she is above edw with ongoing edema, however bp dropping with UF. Tolerated 2L yesterday, will try to challenge tomorrow.  Anemia of esrd: 8.5, continue weekly aranesp Secondary hyperparathyroidism: CCa in range, phos 5.7, cont binders ac.    Rogers Blocker, PA-C 06/08/2023, 10:20 AM  [facility] Pager: [phone]

## 2023-06-09 ENCOUNTER — Inpatient Hospital Stay (HOSPITAL_COMMUNITY)

## 2023-06-09 DIAGNOSIS — N186 End stage renal disease: Secondary | ICD-10-CM | POA: Diagnosis not present

## 2023-06-09 DIAGNOSIS — T82510A Breakdown (mechanical) of surgically created arteriovenous fistula, initial encounter: Secondary | ICD-10-CM | POA: Diagnosis not present

## 2023-06-09 DIAGNOSIS — I480 Paroxysmal atrial fibrillation: Secondary | ICD-10-CM | POA: Diagnosis not present

## 2023-06-09 DIAGNOSIS — K3184 Gastroparesis: Secondary | ICD-10-CM | POA: Diagnosis not present

## 2023-06-09 DIAGNOSIS — E66812 Obesity, class 2: Secondary | ICD-10-CM

## 2023-06-09 DIAGNOSIS — I5033 Acute on chronic diastolic (congestive) heart failure: Secondary | ICD-10-CM | POA: Diagnosis not present

## 2023-06-09 LAB — RENAL FUNCTION PANEL
Albumin: 2.1 g/dL — ABNORMAL LOW (ref 3.5–5.0)
Anion gap: 14 (ref 5–15)
BUN: 36 mg/dL — ABNORMAL HIGH (ref 6–20)
CO2: 23 mmol/L (ref 22–32)
Calcium: 8.6 mg/dL — ABNORMAL LOW (ref 8.9–10.3)
Chloride: 95 mmol/L — ABNORMAL LOW (ref 98–111)
Creatinine, Ser: 5.72 mg/dL — ABNORMAL HIGH (ref 0.44–1.00)
GFR, Estimated: 8 mL/min — ABNORMAL LOW (ref >60)
Glucose, Bld: 172 mg/dL — ABNORMAL HIGH (ref 70–99)
Phosphorus: 7 mg/dL — ABNORMAL HIGH (ref 2.5–4.6)
Potassium: 4 mmol/L (ref 3.5–5.1)
Sodium: 132 mmol/L — ABNORMAL LOW (ref 135–145)

## 2023-06-09 LAB — GLUCOSE, CAPILLARY
Glucose-Capillary: 144 mg/dL — ABNORMAL HIGH (ref 70–99)
Glucose-Capillary: 186 mg/dL — ABNORMAL HIGH (ref 70–99)
Glucose-Capillary: 163 mg/dL — ABNORMAL HIGH (ref 70–99)

## 2023-06-09 MED ORDER — HEPARIN SODIUM (PORCINE) 1000 UNIT/ML IJ SOLN
INTRAMUSCULAR | Status: AC
  Filled 2023-06-09: qty 4

## 2023-06-09 MED ORDER — HYDROMORPHONE HCL 1 MG/ML IJ SOLN
INTRAMUSCULAR | Status: AC
  Filled 2023-06-09: qty 2

## 2023-06-09 MED ORDER — PROMETHAZINE HCL 25 MG PO TABS
25.0000 mg | ORAL_TABLET | Freq: Three times a day (TID) | ORAL | Status: DC
  Administered 2023-06-11 – 2023-06-19 (×13): 25 mg via ORAL
  Filled 2023-06-09 (×29): qty 1

## 2023-06-09 NOTE — Progress Notes (Incomplete)
 Pt refuses phenergan stating that IV medication route medicine works better because it is stronger  Pt states that water is bad for her, continues to drink electrolyte drinks  Pt states that lifesaver gummy candies cure her nausea, candy at the bedside, but is also requesting IV nausea medication  Pt increasingly agitated at times, constantly strongly requesting Dilaudid prior to available times  Pt has also had concerns that her HD cath dressing did not include a type of "glue" or "anchor:". Reached out to IV team to address situation; they recommended to wait to change the dressing if it was intact - due 4/8. The dressing is C/D/I. Dressing was reinforced with additional transparent dressing per pt preference.  2200: Dilaudid IV PRN given with Deborah [name], empty syringes seen by both pt and Clydie Braun, RN  2213: Pt states "I don't know what's up, I am NOT feeling this dilaudid" but is falling asleep on the bedpan   2330: Pt adamant that she is on the bed pan and that she was left on it Pt is not on the bedpan, and was shown that she is wrapped in blankets and laying on the bed Pt loudly exclaiming that she was given diet soda instead of regular; showed pt the label of the regular soda Pt continues to remove telemetry wires and stickers to draw staff into the room  This RN has spent multiple instances of multiple hours rendering patient care

## 2023-06-09 NOTE — Progress Notes (Signed)
 [name] Progress Note   Subjective:   Continues to complain of pain in her left shoulder, arm and rib area. Reports nausea this AM. Denies SOB.   Objective Vitals:   06/09/23 0000 06/09/23 0028 06/09/23 0123 06/09/23 0821  BP: (!) 80/49  95/64   Pulse: (!) 119   (!) 108  Resp: (!) 21 20    Temp:    (!) 97.5 F (36.4 C)  TempSrc:    Oral  SpO2: 93%   100%  Weight:      Height:       Physical Exam  General: Alert female in NAD Heart: RRR, no murmurs, rubs or gallops Lungs: CTA bilaterally Abdomen: Soft, non-distended, +BS Extremities: 2+ edema LLE, R BKA Dialysis Access:  LUE AVF +t/b, [facility]   Additional Objective Labs: Basic Metabolic Panel: Recent Labs  Lab 06/04/23 1419 06/05/23 1220 06/06/23 1415 06/07/23 0343 06/08/23 0248 06/09/23 0252  NA 134* 134*   < > 138 134* 132*  K 4.0 3.6   < > 4.4 3.8 4.0  CL 101 100   < > 102 98 95*  CO2 21* 25   < > 21* 26 23  GLUCOSE 123* 190*   < > 100* 201* 172*  BUN 36* 21*   < > 35* 22* 36*  CREATININE 7.11* 4.76*   < > 6.85* 4.60* 5.72*  CALCIUM 8.5* 7.9*   < > 8.5* 8.5* 8.6*  PHOS 9.5* 5.7*  --   --   --  7.0*   < > = values in this interval not displayed.   Liver Function Tests: Recent Labs  Lab 06/04/23 1419 06/05/23 1220 06/09/23 0252  ALBUMIN 2.1* 2.1* 2.1*   Recent Labs  Lab 06/03/23 0905  LIPASE 24  AMYLASE 64   CBC: Recent Labs  Lab 06/04/23 1419 06/05/23 1220 06/06/23 1415 06/07/23 0343 06/08/23 0248  WBC 11.0* 8.7 9.4 11.0* 9.8  HGB 8.2* 7.8* 8.7* 8.4* 8.5*  HCT 27.4* 26.2* 29.5* 27.4* 28.8*  MCV 100.0 100.0 100.3* 98.9 99.0  PLT 223 214 269 308 256   Blood Culture    Component Value Date/Time   SDES BLOOD BLOOD RIGHT HAND 02/17/2023 2243   SPECREQUEST  02/17/2023 2243    BOTTLES DRAWN AEROBIC AND ANAEROBIC Blood Culture results may not be optimal due to an inadequate volume of blood received in culture bottles   CULT  02/17/2023 2243    NO GROWTH 5 DAYS Performed at  [facility], [address], [place], [place] [address]    REPTSTATUS 02/22/2023 FINAL 02/17/2023 2243    Cardiac Enzymes: No results for input(s): "CKTOTAL", "CKMB", "CKMBINDEX", "TROPONINI" in the last 168 hours. CBG: Recent Labs  Lab 06/08/23 0601 06/08/23 1103 06/08/23 1552 06/08/23 2048 06/09/23 0556  GLUCAP 173* 178* 168* 189* 144*   Iron Studies: No results for input(s): "IRON", "TIBC", "TRANSFERRIN", "FERRITIN" in the last 72 hours. @lablastinr3 @ Studies/Results: VAS [place] UPPER EXTREMITY VENOUS DUPLEX Result Date: 06/08/2023 UPPER VENOUS STUDY  Patient Name:  Deborah [name]  Date of Exam:   06/07/2023 Medical Rec #: [id number]             Accession #:    [id number] Date of Birth: 04-16-1965             Patient Gender: F Patient Age:   20 years Exam Location:  [facility] Procedure:      VAS [place] UPPER EXTREMITY VENOUS DUPLEX Referring Phys: RIPUDEEP RAI --------------------------------------------------------------------------------  Indications: Swelling Other Indications: Fistula, H/O stroke, PAF. Comparison Study: Previous study on 1.16.2023. Performing Technologist: Fernande Bras  Examination Guidelines: A complete evaluation includes B-mode imaging, spectral Doppler, color Doppler, and power Doppler as needed of all accessible portions of each vessel. Bilateral testing is considered an integral part of a complete examination. Limited examinations for reoccurring indications may be performed as noted.  Right Findings: +----------+------------+---------+-----------+----------+-------+ RIGHT     CompressiblePhasicitySpontaneousPropertiesSummary +----------+------------+---------+-----------+----------+-------+ Subclavian               Yes       Yes                      +----------+------------+---------+-----------+----------+-------+  Left Findings: +----------+------------+---------+-----------+----------+-------+ LEFT       CompressiblePhasicitySpontaneousPropertiesSummary +----------+------------+---------+-----------+----------+-------+ IJV           Full       Yes       Yes                      +----------+------------+---------+-----------+----------+-------+ Subclavian    Full       Yes       Yes                      +----------+------------+---------+-----------+----------+-------+ Axillary      Full       Yes       Yes                      +----------+------------+---------+-----------+----------+-------+ Brachial      Full       Yes       Yes                      +----------+------------+---------+-----------+----------+-------+ Radial        Full                                          +----------+------------+---------+-----------+----------+-------+ Ulnar         Full                                          +----------+------------+---------+-----------+----------+-------+ Cephalic      Full       Yes       Yes                      +----------+------------+---------+-----------+----------+-------+ Basilic       Full       Yes       Yes                      +----------+------------+---------+-----------+----------+-------+ Left upper extremity fistula appears patent.  Summary:  Right: No evidence of thrombosis in the subclavian.  Left: No evidence of deep vein thrombosis in the upper extremity. No evidence of superficial vein thrombosis in the upper extremity.  *See table(s) above for measurements and observations.  Diagnosing physician: Coral Else MD Electronically signed by Coral Else MD on 06/08/2023 at 6:59:56 PM.    Final    Medications:   (feeding supplement) PROSource Plus  30 mL Oral BID BM   acidophilus  1 capsule Oral TID WC   allopurinol  100 mg Oral BID  atorvastatin  10 mg Oral QPM   busPIRone  5 mg Oral TID   cholestyramine light  4 g Oral BID   darbepoetin (ARANESP) injection - DIALYSIS  200 mcg Subcutaneous Q Wed-1800   diclofenac  Sodium  2 g Topical QID   famotidine  20 mg Oral QAC breakfast   ferric citrate  210 mg Oral TID WC   folic acid  1 mg Oral Daily   Gerhardt's butt cream   Topical BID   heparin  5,000 Units Subcutaneous Q8H   hyoscyamine  0.125 mg Sublingual TID   insulin aspart  0-6 Units Subcutaneous TID WC   loperamide  2 mg Oral Q6H   metoprolol tartrate  12.5 mg Oral BID   midodrine  10 mg Oral Q T,Th,Sa-HD   mirtazapine  15 mg Oral QHS   pantoprazole  40 mg Oral BID   risperiDONE  1 mg Oral QHS    OP Dialysis Orders: TTS South 4h   B400 101.5 kg 3K bath TDC   heparin none last HD 3/1 9, post weight 105.5 kg Not getting to dry weight, 4-8 kg over Mircera 225 mcg every 2 weeks Hectorol 4 mcg 3 times weekly    Assessment/Plan: Chronic diarrhea / gastroparesis: improving, mgt per admitting team ESRD: on HD TTS.  Continue TTS schedue Vascular access - Has TDC, She does have LUE AVF transposed 12/31/22 and should be able to use but she is reluctant.   Appears mature and ready for use. Reported pain in the fistula yesterday, US shows patient fistula, edema appears improved. Pain seems more related to her ribs/L side than her arm. Discussed risks of chronic TDC with pt. Appreciate vascular surgery's input BP: chronic hypotension on midodrine at home. Cont midodrine.  Volume: she is above edw with ongoing edema, however bp dropping with UF. Tolerated 2L last HD, will try to challenge today  Anemia of esrd: 8.5, continue weekly aranesp Secondary hyperparathyroidism: CCa in range, phos 7.0, cont binders ac. Ongoing counseling regarding renal diet  Rogers Blocker, PA-C 06/09/2023, 10:09 AM  [facility] Pager: [phone]

## 2023-06-09 NOTE — Progress Notes (Signed)
 Dialysis access duplex completed. Please see CV Procedures for preliminary results.  Shona Simpson, RVT 06/09/23 11:23 AM

## 2023-06-09 NOTE — Plan of Care (Signed)
   Problem: Education: Goal: Knowledge of General Education information will improve Description: Including pain rating scale, medication(s)/side effects and non-pharmacologic comfort measures Outcome: Progressing   Problem: Health Behavior/Discharge Planning: Goal: Ability to manage health-related needs will improve Outcome: Progressing   Problem: Clinical Measurements: Goal: Ability to maintain clinical measurements within normal limits will improve Outcome: Progressing   Problem: Coping: Goal: Level of anxiety will decrease Outcome: Progressing   Problem: Pain Managment: Goal: General experience of comfort will improve and/or be controlled Outcome: Progressing

## 2023-06-09 NOTE — Progress Notes (Signed)
 Pt has been stating that she is very hungry, and did not eat dinner Pt has an almost empty large bag of potato chips at the bedside that she has been eating, also saw pt drinking electrolyte drinks and soda (these were all not given to her from hospital staff, unsure of origin)  Pt was given a sandwich which she put mayonnaise on, took a few bites, and then threw out

## 2023-06-09 NOTE — Progress Notes (Addendum)
 Dilaudid PRN given with Matt RN to witness - admin & showed pt & Biochemist, clinical the empty dilaudid syringes PIV flushed with no pain, patent, no issues  Pt had large BM   Pt had large outside confection and ate it, unsure of origin Pt had bed bath

## 2023-06-09 NOTE — TOC Progression Note (Signed)
 Transition of Care [facility]) - Progression Note    Patient Details  Name: Deborah [name] MRN: [id number] Date of Birth: [birth year]  Transition of Care [facility]) CM/SW Contact  Michaela Corner, [place] Phone Number: 06/09/2023, 4:37 PM  Clinical Narrative:   CSW awaiting for patient to work with PT so that Berkley Harvey can be submitted for. Patient refusing PT at this time.   Renal Navigator is working on switching dialysis center (see note).   TOC will continue to follow.    Expected Discharge Plan: Skilled Nursing Facility Barriers to Discharge: Continued Medical Work up, Other (must enter comment) (Awaiting PT/OT to see)  Expected Discharge Plan and Services In-house Referral: Clinical Social Work     Living arrangements for the past 2 months: Skilled Holiday representative                   DME Agency: NA                   Social Determinants of Health (SDOH) Interventions SDOH Screenings   Food Insecurity: No Food Insecurity (05/29/2023)  Housing: Patient Declined (05/29/2023)  Recent Concern: Housing - High Risk (04/25/2023)  Transportation Needs: Unmet Transportation Needs (05/29/2023)  Utilities: Not At Risk (05/29/2023)  Depression (PHQ2-9): Medium Risk (03/16/2023)  Financial Resource Strain: Low Risk (03/23/2021)   Received from [facility])  Physical Activity: Not on File (10/31/2017)   Received from [place], [place]  Social Connections: Unknown (07/19/2021)   Received from [facility], [facility]  Stress: Low Risk (03/23/2021)   Received from [facility])  Tobacco Use: Low Risk  (05/29/2023)    Readmission Risk Interventions    03/07/2023    4:19 PM 11/25/2022    5:16 PM 07/05/2022    1:20 PM  Readmission Risk Prevention Plan  Transportation Screening Complete Complete Complete  Medication Review (RN Care Manager) Complete Complete Referral to Pharmacy  PCP or Specialist appointment within 3-5 days of  discharge Complete Complete Complete  HRI or Home Care Consult Complete Complete Complete  SW Recovery Care/Counseling Consult Complete Complete Complete  Palliative Care Screening Complete Not Applicable Not Applicable  Skilled Nursing Facility Not Applicable Not Applicable Not Applicable

## 2023-06-09 NOTE — Progress Notes (Addendum)
 Progress Note   Patient: Deborah [name] [id number] DOB: 06-10-1965 DOA: 05/29/2023     10 DOS: the patient was seen and examined on 06/09/2023   Brief hospital course: [name] was admitted to the hospital with the working diagnosis of acute on chronic gastroparesis.   58 yrs old female with ESRD on HD TTS, gastroparesis, GERD, PAF not on AC, diastolic CHF with preserved EF 55 to 60%, gout, duodenal carcinoid tumor, PVD s/p right-sided BKA, history of gastric AVM, NASH, depression, anxiety, obesity, restrictive lung disease, spinal stenosis, chronic pain syndrome,T2DM, chronic left heel diabetic ulcer, and history of chronic diarrhea in the setting of carcinoid tumor who presented to ED with complaints of diarrhea in setting of C.Diff and abdominal pain.  Patient has missed hemodialysis because of having intractable nausea and vomiting.  Patient has been taking oral vancomycin for C. Difficile+.  CT A&P no acute findings.  Patient is admitted for fluid overload due to missed hemodialysis due to diarrhea and intractable nausea and vomiting.   Assessment and Plan: * Gastroparesis Gastrointestinal symptoms are improving.  Today with no vomiting and nausea has improved.   Add pre meal metoclopramide  Plan to continue supportive medical therapy with as needed antiemetics.  Scheduled antiacids.   ESRD on dialysis (HCC) Hyponatremia. Hyperphosphatemia.   Patient with signs of volume overload, she has peripheral edema.  She has been not compliant with NaCl restricted diet.  Continue inpatient renal replacement therapy per nephrology recommendations.  She has left upper extremity fistula with appropriate functioning.   Today BUN 36, K 4,0 and bicarbonate 23  P 7.0 and Na 132   HD today per nephrology recommendations.   Anemia of chronic renal disease. Continue with EPO.  Acute on chronic diastolic CHF (congestive heart failure) (HCC) Volume overload in the setting of  ESRD. Patient not compliant with HD as outpatient.  Plan to continue ultrafiltration on HD.   Continue blood pressure support with midodrine. Continue metoprolol.    Paroxysmal atrial fibrillation (HCC) Continue metoprolol for rate control.  Patient is off anticoagulation with to  history of GI bleeding in 10/24   Chronic pain syndrome/chronic abdominal pain Left shoulder pain has improved with topical diclofenac, continue with muscle relaxants and analgesics.   Encourage mobility. Will need home therapy.   Insulin dependent type 2 diabetes mellitus (HCC) Continue glucose cover and monitoring with insulin sliding scale  Patient is having better toleration to po.   Chronic diarrhea with history of C.Diff Diarrhea has resolved.   Malignant carcinoid tumor of duodenum (HCC) S/p excision  Follow up as outpatient.   GERD (gastroesophageal reflux disease) Continue antiacid therapy. As needed antiemetics.   Chronic depression/anxiety Continue remeron, buspar and hydroxyzine  Chronic pain syndrome.   Chronic gout -Continue allopurinol 100 mg twice daily.   Chronic wound of left heel Wound consult   Obesity, class 2 Calculated BMI 38.7         Subjective: Patient with improvement in left shoulder and left body pain, no dyspnea, she continue to have nausea but not vomiting, she is still able to tolerate po   Physical Exam: Vitals:   06/09/23 1500 06/09/23 1501 06/09/23 1502 06/09/23 1530  BP:  107/68 (!) 103/56 95/60  Pulse: 96 95 92 99  Resp: 16 16 14 14   Temp:      TempSrc:      SpO2: 100% 100% 100% 100%  Weight:      Height:  Neurology awake and alert ENT with mild pallor Cardiovascular with S1 and S2 present and regular with no gallops, or rubs positive systolic murmur at the lower sternal border.  Respiratory with mild rhonchi with no wheezing. No rales Lower extremity with left edema ++++ and positive edema at right BKA +++  Data  Reviewed:    Family Communication: no family at the bedside   Disposition: Status is: Inpatient Remains inpatient appropriate because: nausea control   Planned Discharge Destination: Home      Author: Coralie Keens, MD 06/09/2023 3:47 PM  For on call review [email or web address].

## 2023-06-09 NOTE — Progress Notes (Signed)
 PT Cancellation Note  Patient Details Name: Deborah [name] MRN: [id number] DOB: May 14, 1965   Cancelled Treatment:    Reason Eval/Treat Not Completed: Patient at procedure or test/unavailable (Pt off floor for HD. Will follow-up tomorrow for PT treatment.)  Cheri Guppy, PT, DPT Acute Rehabilitation Services Office: [phone] Secure Chat Preferred  Richardson Chiquito 06/09/2023, 3:03 PM

## 2023-06-09 NOTE — Progress Notes (Signed)
 Gave dilaudid with witness Clydie Braun, RN Pt concerned that she did not get the whole syringe of dilaudid - both empty syringes taken out of trash and showed to both pt and Clydie Braun, RN  Pt then later stated a few minutes afterwards that her IV was not used/flushed  Pt reaffirms that the IV team member did not come and place a new IV Reaffirmed to pt that the IV team member did place a new PIV in her right upper arm This RN spoke to IV team member in the hallway at that time IV team member also spoke to the charge RN Pt believes that her PIV is old and positional and is unable to orient

## 2023-06-09 NOTE — Assessment & Plan Note (Addendum)
 Calculated BMI 38.7

## 2023-06-09 NOTE — Progress Notes (Signed)
 Contacted Kim with [facility] out-pt HD to request an update on pt's referral. MD to review referral and then financial clearance will be needed. Kim to f/u with navigator with determination as soon as it is known. Will assist as needed.   Olivia Canter Renal Navigator [phone]

## 2023-06-10 DIAGNOSIS — N186 End stage renal disease: Secondary | ICD-10-CM | POA: Diagnosis not present

## 2023-06-10 DIAGNOSIS — I5033 Acute on chronic diastolic (congestive) heart failure: Secondary | ICD-10-CM | POA: Diagnosis not present

## 2023-06-10 DIAGNOSIS — I480 Paroxysmal atrial fibrillation: Secondary | ICD-10-CM | POA: Diagnosis not present

## 2023-06-10 DIAGNOSIS — K3184 Gastroparesis: Secondary | ICD-10-CM | POA: Diagnosis not present

## 2023-06-10 LAB — CBC
HCT: 27.7 % — ABNORMAL LOW (ref 36.0–46.0)
Hemoglobin: 8.4 g/dL — ABNORMAL LOW (ref 12.0–15.0)
MCH: 30.2 pg (ref 26.0–34.0)
MCHC: 30.3 g/dL (ref 30.0–36.0)
MCV: 99.6 fL (ref 80.0–100.0)
Platelets: 320 10*3/uL (ref 150–400)
RBC: 2.78 MIL/uL — ABNORMAL LOW (ref 3.87–5.11)
RDW: 14.8 % (ref 11.5–15.5)
WBC: 12.2 10*3/uL — ABNORMAL HIGH (ref 4.0–10.5)
nRBC: 0 % (ref 0.0–0.2)

## 2023-06-10 LAB — RENAL FUNCTION PANEL
Albumin: 1.9 g/dL — ABNORMAL LOW (ref 3.5–5.0)
Anion gap: 11 (ref 5–15)
BUN: 33 mg/dL — ABNORMAL HIGH (ref 6–20)
CO2: 26 mmol/L (ref 22–32)
Calcium: 8.1 mg/dL — ABNORMAL LOW (ref 8.9–10.3)
Chloride: 100 mmol/L (ref 98–111)
Creatinine, Ser: 5.2 mg/dL — ABNORMAL HIGH (ref 0.44–1.00)
GFR, Estimated: 9 mL/min — ABNORMAL LOW (ref >60)
Glucose, Bld: 117 mg/dL — ABNORMAL HIGH (ref 70–99)
Phosphorus: 6.2 mg/dL — ABNORMAL HIGH (ref 2.5–4.6)
Potassium: 3.5 mmol/L (ref 3.5–5.1)
Sodium: 137 mmol/L (ref 135–145)

## 2023-06-10 LAB — GLUCOSE, CAPILLARY
Glucose-Capillary: 142 mg/dL — ABNORMAL HIGH (ref 70–99)
Glucose-Capillary: 132 mg/dL — ABNORMAL HIGH (ref 70–99)
Glucose-Capillary: 156 mg/dL — ABNORMAL HIGH (ref 70–99)

## 2023-06-10 MED ORDER — HYDROMORPHONE HCL 1 MG/ML IJ SOLN
1.0000 mg | INTRAMUSCULAR | Status: DC | PRN
  Administered 2023-06-10 – 2023-06-12 (×9): 2 mg via INTRAVENOUS
  Administered 2023-06-12: 1 mg via INTRAVENOUS
  Administered 2023-06-13 – 2023-06-14 (×7): 2 mg via INTRAVENOUS
  Filled 2023-06-10 (×17): qty 2

## 2023-06-10 NOTE — Plan of Care (Signed)
  Problem: Health Behavior/Discharge Planning: Goal: Ability to manage health-related needs will improve Outcome: Progressing   Problem: Nutritional: Goal: Maintenance of adequate nutrition will improve Outcome: Progressing Goal: Progress toward achieving an optimal weight will improve Outcome: Progressing   Problem: Skin Integrity: Goal: Risk for impaired skin integrity will decrease Outcome: Progressing   Problem: Education: Goal: Knowledge of General Education information will improve Description: Including pain rating scale, medication(s)/side effects and non-pharmacologic comfort measures Outcome: Progressing   Problem: Health Behavior/Discharge Planning: Goal: Ability to manage health-related needs will improve Outcome: Progressing   Problem: Activity: Goal: Risk for activity intolerance will decrease Outcome: Progressing   Problem: Nutrition: Goal: Adequate nutrition will be maintained Outcome: Progressing   Problem: Skin Integrity: Goal: Risk for impaired skin integrity will decrease Outcome: Progressing

## 2023-06-10 NOTE — Progress Notes (Addendum)
 Progress Note   Patient: Deborah [name] [id number] DOB: [birth year] DOA: 05/29/2023     11 DOS: the patient was seen and examined on 06/10/2023   Brief hospital course: Deborah [name] was admitted to the hospital with the working diagnosis of acute on chronic gastroparesis.   58 yrs old female with ESRD on HD TTS, gastroparesis, GERD, PAF not on AC, diastolic CHF with preserved EF 55 to 60%, gout, duodenal carcinoid tumor, PVD s/p right-sided BKA, history of gastric AVM, NASH, depression, anxiety, obesity, restrictive lung disease, spinal stenosis, chronic pain syndrome,T2DM, chronic left heel diabetic ulcer, and history of chronic diarrhea in the setting of carcinoid tumor who presented to ED with complaints of diarrhea in setting of C.Diff and abdominal pain.  Patient has missed hemodialysis because of having intractable nausea and vomiting.  Patient has been taking oral vancomycin for C. Difficile+.   On her initial physical examination her blood pressure was 131/71, HR 89, RR 16 and 02 saturation 100%. Lungs with no wheezing or rales, no rhonchi, heart with S1 and S2 present and regular with no gallops, rubs or murmurs, abdomen protuberant, with generalized tenderness to palpation, with no rebound or guarding, positive lower extremity edema, right BKA.   CT A&P no acute findings.    Patient is admitted for fluid overload due to missed hemodialysis due to diarrhea and intractable nausea and vomiting.   04/04 decrease frequency of IV hydromorphone. Pending placement in SNF   Assessment and Plan: * Gastroparesis Gastrointestinal symptoms are improving.  Continue with pre meal phenergan po .   Plan to continue supportive medical therapy with as needed antiemetics.  Scheduled antiacids.   ESRD on dialysis (HCC) Hyponatremia. Hyperphosphatemia.   Patient with signs of volume overload, she has peripheral edema.  She has been not compliant with NaCl restricted diet.  Continue  inpatient renal replacement therapy per nephrology recommendations.  She has left upper extremity fistula with appropriate functioning.   BUN is 33, K 3,5 and bicarbonate 33 Na 137   HD per nephrology recommendations   Anemia of chronic renal disease. Continue with EPO.  Acute on chronic diastolic CHF (congestive heart failure) (HCC) Volume overload in the setting of ESRD. Patient not compliant with HD as outpatient.  Plan to continue ultrafiltration on HD.   Continue blood pressure support with midodrine. Continue metoprolol.    Paroxysmal atrial fibrillation (HCC) Continue metoprolol for rate control.  Patient is off anticoagulation with to  history of GI bleeding in 10/24   Ok to discontinue telemetry and transfer to medical ward.   Chronic pain syndrome/chronic abdominal pain Left shoulder pain has improved with topical diclofenac, continue with muscle relaxants and analgesics.   Encourage mobility. Will need home therapy.  Decrease frequency of IV hydromorphone to every 5 hrs as needed.   Insulin dependent type 2 diabetes mellitus (HCC) Continue glucose cover and monitoring with insulin sliding scale  Patient is having better toleration to po.   Chronic diarrhea with history of C.Diff Diarrhea improving   Malignant carcinoid tumor of duodenum (HCC) S/p excision  Follow up as outpatient.   GERD (gastroesophageal reflux disease) Continue antiacid therapy. As needed antiemetics.   Chronic depression/anxiety Continue remeron, buspar and hydroxyzine  Chronic pain syndrome.   Chronic gout -Continue allopurinol 100 mg twice daily.   Chronic wound of left heel Wound consult   Obesity, class 2 Calculated BMI 38.7         Subjective: Patient with improved left shoulder pain, continue  to have nausea but no vomiting, tolerating po. No dyspnea   Physical Exam: Vitals:   06/09/23 2124 06/10/23 0816 06/10/23 1131 06/10/23 1603  BP: (!) 109/43 92/66 110/67  95/70  Pulse:  97 95 95  Resp: 13 11 18 18   Temp:  97.7 F (36.5 C) 97.7 F (36.5 C) 97.6 F (36.4 C)  TempSrc:  Oral Oral Oral  SpO2:  96% 96% 100%  Weight:      Height:       Neurology awake and alert ENT with mild pallor Cardiovascular with S1 and S2 present and regular with no gallops or rubs Respiratory with no rales or wheezing, no rhonchi  Abdomen with no distention, protuberant. Right BKA and left lower extremity edema +++  Data Reviewed:    Family Communication: I spoke with patient's mother at the bedside, we talked in detail about patient's condition, plan of care and prognosis and all questions were addressed.   Disposition: Status is: Inpatient Remains inpatient appropriate because: pending transfer to SNF   Planned Discharge Destination: Skilled nursing facility     Author: Coralie Keens, MD 06/10/2023 5:20 PM  For on call review [email or web address].

## 2023-06-10 NOTE — Progress Notes (Signed)
 Contacted Kim with [facility] out-pt HD. Pt's referral is still pending MD approval. Selena Batten to f/u with provider to inquire if there has been a determination. Will await a response.   Olivia Canter Renal Navigator [phone]

## 2023-06-10 NOTE — Plan of Care (Signed)
  Problem: Education: Goal: Ability to describe self-care measures that may prevent or decrease complications (Diabetes Survival Skills Education) will improve Outcome: Not Progressing   Problem: Nutritional: Goal: Maintenance of adequate nutrition will improve Outcome: Not Progressing

## 2023-06-10 NOTE — Progress Notes (Signed)
 Subjective:  seen In HD room pre hd  without Cos    Objective Vital signs in last 24 hours: Vitals:   06/09/23 1727 06/09/23 2116 06/09/23 2124 06/10/23 0816  BP: 103/64 (!) 109/43 (!) 109/43 92/66  Pulse: (!) 105 (!) 112  97  Resp: 20 20 13 11   Temp: 98.3 F (36.8 C) 97.7 F (36.5 C)  97.7 F (36.5 C)  TempSrc:  Oral  Oral  SpO2: 100% 100%  96%  Weight:      Height:       Weight change:   Physical Exam: General: alert, NAD , in HD  Heart: RRR , No MRG Lungs: CTA ant. Nonlabored  Abdomen: NABS, obese, soft, NT,ND Extremities: 1+ bipedal  edema, R BKA Dialysis Access: LUA  AVF + bruit, TDC     OP Dialysis Orders: TTS South 4h   B400 101.5 kg 3K bath TDC   heparin none last HD 3/1 9, post weight 105.5 kg Not getting to dry weight, 4-8 kg over Mircera 225 mcg every 2 weeks Hectorol 4 mcg 3 times weekly     Problem/Plan: Chronic diarrhea / gastroparesis: improving, mgt per admitting team ESRD: on HD TTS.  Continue TTS schedue Vascular access - Has TDC, She does have LUE AVF transposed 12/31/22 and should be able to use but she has been reluctant.   Appears mature and ready for use. Reported pain in the fistula yesterday, US shows patient fistula, edema appears improved. Pain seems more related to her ribs/L side than her arm. Discussed risks of chronic TDC with pt. Appreciate vascular surgery's input BP: chronic hypotension on midodrine at home. Cont midodrine.  Volume: she is above edw with ongoing edema, however bp dropping with UF. Tolerated 2L last HD, will try to challenge today  Anemia of esrd: 8.5, continue weekly aranesp Secondary hyperparathyroidism: CCa in range, phos 7.0, cont binders ac. Ongoing counseling regarding renal diet  Lenny Pastel, PA-C [facility] [phone] 06/10/2023,10:35 AM  LOS: 11 days   Labs: Basic Metabolic Panel: Recent Labs  Lab 06/05/23 1220 06/06/23 1415 06/08/23 0248 06/09/23 0252 06/10/23 0246  NA 134*    < > 134* 132* 137  K 3.6   < > 3.8 4.0 3.5  CL 100   < > 98 95* 100  CO2 25   < > 26 23 26   GLUCOSE 190*   < > 201* 172* 117*  BUN 21*   < > 22* 36* 33*  CREATININE 4.76*   < > 4.60* 5.72* 5.20*  CALCIUM 7.9*   < > 8.5* 8.6* 8.1*  PHOS 5.7*  --   --  7.0* 6.2*   < > = values in this interval not displayed.   Liver Function Tests: Recent Labs  Lab 06/05/23 1220 06/09/23 0252 06/10/23 0246  ALBUMIN 2.1* 2.1* 1.9*   No results for input(s): "LIPASE", "AMYLASE" in the last 168 hours. No results for input(s): "AMMONIA" in the last 168 hours. CBC: Recent Labs  Lab 06/04/23 1419 06/05/23 1220 06/06/23 1415 06/07/23 0343 06/08/23 0248  WBC 11.0* 8.7 9.4 11.0* 9.8  HGB 8.2* 7.8* 8.7* 8.4* 8.5*  HCT 27.4* 26.2* 29.5* 27.4* 28.8*  MCV 100.0 100.0 100.3* 98.9 99.0  PLT 223 214 269 308 256   Cardiac Enzymes: No results for input(s): "CKTOTAL", "CKMB", "CKMBINDEX", "TROPONINI" in the last 168 hours. CBG: Recent Labs  Lab 06/08/23 1552 06/08/23 2048 06/09/23 0556 06/09/23 1120 06/09/23 2134  GLUCAP 168* 189* 144* 186*  163*    Studies/Results: VAS US DUPLEX DIALYSIS ACCESS (AVF, AVG) Result Date: 06/09/2023 DIALYSIS ACCESS Patient Name:  Deborah [name]  Date of Exam:   06/09/2023 Medical Rec #: [id number]             Accession #:    [id number] Date of Birth: 21-May-1965             Patient Gender: F Patient Age:   58 years Exam Location:  [facility] Procedure:      VAS US DUPLEX DIALYSIS ACCESS (AVF, AVG) Referring Phys: Graceann Congress --------------------------------------------------------------------------------  Reason for Exam: Mechanical complication AVF. Access Site: Left Upper Extremity. Access Type: Brachial-cephalic AVF. Performing Technologist: Shona Simpson  Examination Guidelines: A complete evaluation includes B-mode imaging, spectral Doppler, color Doppler, and power Doppler as needed of all accessible portions of each vessel. Unilateral testing is considered  an integral part of a complete examination. Limited examinations for reoccurring indications may be performed as noted.  Findings: +--------------------+----------+-----------------+--------+ AVF                 PSV (cm/s)Flow Vol (mL/min)Comments +--------------------+----------+-----------------+--------+ Native artery inflow   121                              +--------------------+----------+-----------------+--------+ AVF Anastomosis        294           336                +--------------------+----------+-----------------+--------+  +------------+----------+-------------+----------+------------------+ OUTFLOW VEINPSV (cm/s)Diameter (cm)Depth (cm)     Describe      +------------+----------+-------------+----------+------------------+ Confluence     101        0.72        2.22                      +------------+----------+-------------+----------+------------------+ Prox UA         92        0.56        1.55                      +------------+----------+-------------+----------+------------------+ Mid UA         106        0.58        0.32                      +------------+----------+-------------+----------+------------------+ Dist UA         76        1.00        0.32   change in Diameter +------------+----------+-------------+----------+------------------+   Summary: Arteriovenous graft-Aneurysmal dilatation is noted in the Distal upper arm. *See table(s) above for measurements and observations.  Diagnosing physician: Heath Lark Electronically signed by Heath Lark on 06/09/2023 at 4:14:04 PM.   --------------------------------------------------------------------------------   Final    Medications:   (feeding supplement) PROSource Plus  30 mL Oral BID BM   acidophilus  1 capsule Oral TID WC   allopurinol  100 mg Oral BID   atorvastatin  10 mg Oral QPM   busPIRone  5 mg Oral TID   cholestyramine light  4 g Oral BID   darbepoetin (ARANESP) injection -  DIALYSIS  200 mcg Subcutaneous Q Wed-1800   diclofenac Sodium  2 g Topical QID   famotidine  20 mg Oral QAC breakfast   ferric citrate  210 mg Oral TID WC  folic acid  1 mg Oral Daily   Gerhardt's butt cream   Topical BID   heparin  5,000 Units Subcutaneous Q8H   hyoscyamine  0.125 mg Sublingual TID   insulin aspart  0-6 Units Subcutaneous TID WC   loperamide  2 mg Oral Q6H   metoprolol tartrate  12.5 mg Oral BID   midodrine  10 mg Oral Q T,Th,Sa-HD   mirtazapine  15 mg Oral QHS   pantoprazole  40 mg Oral BID   promethazine  25 mg Oral TID AC & HS   risperiDONE  1 mg Oral QHS

## 2023-06-10 NOTE — Progress Notes (Addendum)
  Progress Note    06/10/2023 11:22 AM * No surgery found *  Subjective:  L arm pain improved today   Vitals:   06/09/23 2124 06/10/23 0816  BP: (!) 109/43 92/66  Pulse:  97  Resp: 13 11  Temp:  97.7 F (36.5 C)  SpO2:  96%   Physical Exam: Lungs:  non labored Extremities:  palpable L radial pulse; palpable thrill throughout fistula Neurologic: A&O  CBC    Component Value Date/Time   WBC 9.8 06/08/2023 0248   RBC 2.91 (L) 06/08/2023 0248   HGB 8.5 (L) 06/08/2023 0248   HGB 8.8 (L) 10/09/2019 0906   HGB 9.6 (L) 09/03/2019 1620   HCT 28.8 (L) 06/08/2023 0248   HCT 30.4 (L) 09/03/2019 1620   PLT 256 06/08/2023 0248   PLT 348 10/09/2019 0906   PLT 451 (H) 09/03/2019 1620   MCV 99.0 06/08/2023 0248   MCV 85 09/03/2019 1620   MCH 29.2 06/08/2023 0248   MCHC 29.5 (L) 06/08/2023 0248   RDW 14.9 06/08/2023 0248   RDW 13.6 09/03/2019 1620   LYMPHSABS 2.6 05/29/2023 0301   LYMPHSABS 2.2 09/03/2019 1620   MONOABS 0.6 05/29/2023 0301   EOSABS 0.3 05/29/2023 0301   EOSABS 0.3 09/03/2019 1620   BASOSABS 0.1 05/29/2023 0301   BASOSABS 0.1 09/03/2019 1620    BMET    Component Value Date/Time   NA 137 06/10/2023 0246   NA 137 09/20/2019 1530   K 3.5 06/10/2023 0246   CL 100 06/10/2023 0246   CO2 26 06/10/2023 0246   GLUCOSE 117 (H) 06/10/2023 0246   BUN 33 (H) 06/10/2023 0246   BUN 18 09/20/2019 1530   CREATININE 5.20 (H) 06/10/2023 0246   CALCIUM 8.1 (L) 06/10/2023 0246   GFRNONAA 9 (L) 06/10/2023 0246   GFRAA 49 (L) 11/30/2019 0356    INR    Component Value Date/Time   INR 1.5 (H) 03/22/2023 2005     Intake/Output Summary (Last 24 hours) at 06/10/2023 1122 Last data filed at 06/09/2023 1727 Gross per 24 hour  Intake 120 ml  Output 201.1 ml  Net -81.1 ml     Assessment/Plan:  58 y.o. female with L arm pain; history of L arm AVF  Patent fistula in LUE with palpable thrill and no signs or symptoms of steal Duplex is negative for any hemodynamically  significant stenosis through the fistula Ok to cannulate Call vascular with any further questions    Emilie Rutter, PA-C [facility] [phone] 06/10/2023 11:22 AM

## 2023-06-10 NOTE — Progress Notes (Signed)
 Physical Therapy Treatment Patient Details Name: Deborah [name] MRN: [id number] DOB: [birth year] Today's Date: 06/10/2023   History of Present Illness 58 y/o female presents to [facility] on 05/29/23 with resports of chest pain, abdominal pain, and diahrrea in setting of C-diff. Missed 2 dialysis sessions, fluid overloaded.  PMHx: ESRD on dialysis, gastroparesis, GERD, anemia of chronic disease, PAF, diastolic heart failure with preserved EF 55 to 60%, gout, duodenal carcinoid tumor, PVD s/p R BKA, history of gastric AVM, NASH, reactive dyskinesia, depression, anxiety, obesity, restrictive lung disease, spinal stenosis, chronic pain syndrome, insulin-dependent DM type II, chronic left heel diabetic ulcer, and history of chronic diarrhea 2/2 carcinoid tumor.    PT Comments  Pt greeted seated EOB, pleasant and agreeable to PT treatment. Pt requested to use bathroom via bedpan at start of session. Extensive time dedicated to pericare and hygiene. Pt required minA to bring LLE into bed. She was able to control RLE and reposition herself in the bed. Pt rolled using bedrail to the L with supervision and to the R with minA. She maintained L sidelying for a prolonged period of time and was able to assist in cleaning using RUE. Pt returned to sitting EOB with HOB elevated, use of bedrail, and no physical assistance from PT. She demonstrated improved ROM/strength in all extremities compared to initial evaluation. Patient will benefit from continued inpatient follow up therapy, <3 hours/day.    If plan is discharge home, recommend the following: A lot of help with walking and/or transfers;A lot of help with bathing/dressing/bathroom;Assistance with cooking/housework;Direct supervision/assist for medications management;Direct supervision/assist for financial management;Assist for transportation;Help with stairs or ramp for entrance   Can travel by private vehicle     No  Equipment Recommendations  None recommended  by PT    Recommendations for Other Services       Precautions / Restrictions Precautions Precautions: Fall Recall of Precautions/Restrictions: Impaired Precaution/Restrictions Comments: R BKA Restrictions Weight Bearing Restrictions Per Provider Order: No     Mobility  Bed Mobility Overal bed mobility: Needs Assistance Bed Mobility: Sit to Supine, Rolling, Supine to Sit Rolling: Used rails, Supervision, Min assist   Supine to sit: HOB elevated, Used rails, Supervision Sit to supine: Min assist, HOB elevated   General bed mobility comments: Pt greeted seated EOB. Requested to use bathroom on bedpan. She required minA to bring LLE back into bed, swinging RLE into bed and repositioning herself in the center of the bed. Pt rolled L by pulling on bedrail for bedpan to be placed underneath her. She returned to supine to compelete BM. Pt rolled L again and maintained L sideylying holding onto bedrail for a prolonged period of time as pericare was addressed. Pt assisted with hygiene wiping herself with RUE. PT removed soiled bedpad and replaced with clean bedpads. Pt rolled R with minA to reach bedrail and achieve sidelying. Pt was able to slightly lift hips to allow for sheet/bedpad to be unrolled underneath her. Pt performed R hip Abd for powder to be placed inbetween her thighs. Returning to sitting pt took increased time and was able to achieve upright posture without physical assistance. Seated EOB pt applied more barrier cream with her LUE on her side/bottom.    Transfers Overall transfer level: Needs assistance                 General transfer comment: Pt declined to attempt transfer to [facility] reporting her L knee buckles and she was nervous to attempt. Educated her on guarding,  blocking of knee, and option to use lift equipment (stedy), but she continued to decline. At end of session, pt reported she would be willing to transfer via the maximove to recliner chair if she was able to  get a bariatric recliner in her room. She transfers via hoyer lift at baseline.    Ambulation/Gait                   Stairs             Wheelchair Mobility     Tilt Bed    Modified Rankin (Stroke Patients Only)       Balance Overall balance assessment: Needs assistance Sitting-balance support: No upper extremity supported, Feet supported Sitting balance-Leahy Scale: Good Sitting balance - Comments: Pt sat EOB with supervision. She was able to adjust tray table and reach items in front of her.                                    Communication Communication Communication: No apparent difficulties  Cognition Arousal: Alert Behavior During Therapy: WFL for tasks assessed/performed   PT - Cognitive impairments: No apparent impairments                         Following commands: Intact      Cueing Cueing Techniques: Verbal cues  Exercises      General Comments General comments (skin integrity, edema, etc.): VSS on RA. Pt became frustrated with telemetry and unplugged herself and began removing leads, RN notified. Extensive time dedicated to pericare/hygiene.      Pertinent Vitals/Pain Pain Assessment Pain Assessment: Faces Faces Pain Scale: Hurts little more Pain Location: LLE and Bottom Pain Descriptors / Indicators: Aching, Discomfort, Throbbing, Grimacing, Guarding, Moaning Pain Intervention(s): Monitored during session    Home Living                          Prior Function            PT Goals (current goals can now be found in the care plan section) Acute Rehab PT Goals Patient Stated Goal: Decrease pain and go to rehab Progress towards PT goals: Progressing toward goals    Frequency    Min 1X/week      PT Plan      Co-evaluation              AM-PAC PT "6 Clicks" Mobility   Outcome Measure  Help needed turning from your back to your side while in a flat bed without using bedrails?: A  Lot Help needed moving from lying on your back to sitting on the side of a flat bed without using bedrails?: A Lot Help needed moving to and from a bed to a chair (including a wheelchair)?: Total Help needed standing up from a chair using your arms (e.g., wheelchair or bedside chair)?: Total Help needed to walk in hospital room?: Total Help needed climbing 3-5 steps with a railing? : Total 6 Click Score: 8    End of Session   Activity Tolerance: Patient limited by pain Patient left: in bed;with call bell/phone within reach Nurse Communication: Mobility status;Need for lift equipment;Other (comment) (Pt's removal of telemetry leads and request for a bariatric recliner.) PT Visit Diagnosis: Muscle weakness (generalized) (M62.81);Pain Pain - Right/Left: Left Pain - part of body: Leg;Ankle and joints  of foot;Knee     Time: 1130-1220 PT Time Calculation (min) (ACUTE ONLY): 50 min  Charges:    $Therapeutic Activity: 38-52 mins PT General Charges $$ ACUTE PT VISIT: 1 Visit                     Cheri Guppy, PT, DPT Acute Rehabilitation Services Office: [phone] Secure Chat Preferred  Richardson Chiquito 06/10/2023, 1:01 PM

## 2023-06-11 DIAGNOSIS — N186 End stage renal disease: Secondary | ICD-10-CM | POA: Diagnosis not present

## 2023-06-11 DIAGNOSIS — I5033 Acute on chronic diastolic (congestive) heart failure: Secondary | ICD-10-CM | POA: Diagnosis not present

## 2023-06-11 DIAGNOSIS — E66812 Obesity, class 2: Secondary | ICD-10-CM

## 2023-06-11 DIAGNOSIS — K3184 Gastroparesis: Secondary | ICD-10-CM | POA: Diagnosis not present

## 2023-06-11 DIAGNOSIS — I480 Paroxysmal atrial fibrillation: Secondary | ICD-10-CM | POA: Diagnosis not present

## 2023-06-11 LAB — GLUCOSE, CAPILLARY
Glucose-Capillary: 164 mg/dL — ABNORMAL HIGH (ref 70–99)
Glucose-Capillary: 164 mg/dL — ABNORMAL HIGH (ref 70–99)
Glucose-Capillary: 151 mg/dL — ABNORMAL HIGH (ref 70–99)
Glucose-Capillary: 132 mg/dL — ABNORMAL HIGH (ref 70–99)

## 2023-06-11 MED ORDER — METHOCARBAMOL 500 MG PO TABS
500.0000 mg | ORAL_TABLET | Freq: Three times a day (TID) | ORAL | Status: DC
Start: 2023-06-11 — End: 2023-06-20
  Administered 2023-06-11 – 2023-06-20 (×24): 500 mg via ORAL
  Filled 2023-06-11 (×25): qty 1

## 2023-06-11 MED ORDER — LIDOCAINE HCL (PF) 1 % IJ SOLN: 5.0000 mL | INTRAMUSCULAR | Status: DC | PRN

## 2023-06-11 MED ORDER — ORAL CARE MOUTH RINSE: 15.0000 mL | OROMUCOSAL | Status: DC | PRN

## 2023-06-11 MED ORDER — ALTEPLASE 2 MG IJ SOLR: 2.0000 mg | Freq: Once | INTRAMUSCULAR | Status: DC | PRN

## 2023-06-11 MED ORDER — TRAZODONE HCL 50 MG PO TABS
50.0000 mg | ORAL_TABLET | Freq: Every day | ORAL | Status: DC
  Administered 2023-06-11 – 2023-06-19 (×9): 50 mg via ORAL
  Filled 2023-06-11 (×9): qty 1

## 2023-06-11 MED ORDER — LIDOCAINE-PRILOCAINE 2.5-2.5 % EX CREA: 1.0000 | TOPICAL_CREAM | CUTANEOUS | Status: DC | PRN

## 2023-06-11 MED ORDER — NEPRO/CARBSTEADY PO LIQD: 237.0000 mL | ORAL | Status: DC | PRN

## 2023-06-11 MED ORDER — HEPARIN SODIUM (PORCINE) 1000 UNIT/ML DIALYSIS
1000.0000 [IU] | INTRAMUSCULAR | Status: DC | PRN
  Administered 2023-06-13: 1000 [IU]
  Filled 2023-06-11: qty 1

## 2023-06-11 MED ORDER — HYDROXYZINE HCL 25 MG PO TABS
25.0000 mg | ORAL_TABLET | Freq: Once | ORAL | Status: AC
  Administered 2023-06-11: 25 mg via ORAL
  Filled 2023-06-11: qty 1

## 2023-06-11 MED ORDER — HYDROXYZINE HCL 25 MG PO TABS
25.0000 mg | ORAL_TABLET | ORAL | Status: DC | PRN
  Administered 2023-06-12 – 2023-06-20 (×19): 25 mg via ORAL
  Filled 2023-06-11 (×19): qty 1

## 2023-06-11 MED ORDER — PENTAFLUOROPROP-TETRAFLUOROETH EX AERO: 1.0000 | INHALATION_SPRAY | CUTANEOUS | Status: DC | PRN

## 2023-06-11 MED ORDER — ANTICOAGULANT SODIUM CITRATE 4% (200MG/5ML) IV SOLN: 5.0000 mL | Status: DC | PRN

## 2023-06-11 NOTE — Progress Notes (Signed)
 Pt sent for HD and she refused. Dr. Arlean Hopping informed of pt refusal. Patient was asked per Dr. Arlean Hopping would she have HD if the catheter was used instead of the fistula. When patient was asked pt stated because of her anxiety today she did not want to have HD. Dr. Arlean Hopping informed of her reason.

## 2023-06-11 NOTE — Progress Notes (Signed)
 Subjective: Patient sitting up in bed no current complaints, noted for dialysis today.  Also yesterday seen after completing late dialysis from Thursday night she had refused HD Thursday morning and cannulation of AV fistula/discussed with her use today of fistula she said she would consider  Objective Vital signs in last 24 hours: Vitals:   06/10/23 2000 06/11/23 0438 06/11/23 0756 06/11/23 0758  BP: 109/64 (!) 99/55  (!) 96/59  Pulse: 99 100  (!) 103  Resp: 18 18  18   Temp: 98 F (36.7 C) 97.7 F (36.5 C)  97.7 F (36.5 C)  TempSrc: Oral Oral  Oral  SpO2: 98% 100%  100%  Weight:   109.2 kg   Height:       Weight change:   Physical Exam: General: alert, NAD  Heart: RRR , No MRG Lungs: CTA ant. Nonlabored  Abdomen: NABS, obese, soft, NT,ND Extremities: 1+ bipedal  edema, R BKA Dialysis Access: LUA  AVF + bruit, TDC      OP Dialysis Orders: [facility] 4h   B400 101.5 kg 3K bath TDC   heparin none last HD 3/1 9, post weight 105.5 kg Not getting to dry weight, 4-8 kg over Mircera 225 mcg every 2 weeks Hectorol 4 mcg 3 times weekly     Problem/Plan: Chronic diarrhea / gastroparesis: improving, mgt per admitting team ESRD: on HD TTS.  Continue TTS schedue Vascular access - Has TDC, She does have LUE AVF transposed 12/31/22,  she has been reluctant to use AVF .  Noted to byV VS notes yest.4/04 okayed to cannulate   appears mature and ready for use.  Pain seems more related to her ribs/L side than her arm. Discussed risks of chronic TDC with pt. Appreciate vascular surgery's input BP: chronic hypotension on midodrine at home. Cont midodrine.  Volume: she is above edw with ongoing edema, however bp dropping with UF. Tolerated 2L last HD, will try to challenge UF as able Anemia of esrd: 8.4, continue weekly aranesp Secondary hyperparathyroidism: CCa in range, phos explained to, cont binders ac. Ongoing counseling regarding renal diet Proximal A-fib= on metoprolol for rate control  no anticoagulation secondary to GI bleed October 2024 Diabetes type 2= admit team Chronic pain syndrome/chronic abdominal pain= on meds per admit team, including noted yesterday decreasing IV morphine to every 5 hours as needed Depression/anxiety= per admit team Remeron, BuSpar ,hydroxyzine  Lenny Pastel, PA-C [facility] [phone] 06/11/2023,12:02 PM  LOS: 12 days   Labs: Basic Metabolic Panel: Recent Labs  Lab 06/05/23 1220 06/06/23 1415 06/08/23 0248 06/09/23 0252 06/10/23 0246  NA 134*   < > 134* 132* 137  K 3.6   < > 3.8 4.0 3.5  CL 100   < > 98 95* 100  CO2 25   < > 26 23 26   GLUCOSE 190*   < > 201* 172* 117*  BUN 21*   < > 22* 36* 33*  CREATININE 4.76*   < > 4.60* 5.72* 5.20*  CALCIUM 7.9*   < > 8.5* 8.6* 8.1*  PHOS 5.7*  --   --  7.0* 6.2*   < > = values in this interval not displayed.   Liver Function Tests: Recent Labs  Lab 06/05/23 1220 06/09/23 0252 06/10/23 0246  ALBUMIN 2.1* 2.1* 1.9*   No results for input(s): "LIPASE", "AMYLASE" in the last 168 hours. No results for input(s): "AMMONIA" in the last 168 hours. CBC: Recent Labs  Lab 06/05/23 1220 06/06/23 1415 06/07/23  1610 06/08/23 0248 06/10/23 2136  WBC 8.7 9.4 11.0* 9.8 12.2*  HGB 7.8* 8.7* 8.4* 8.5* 8.4*  HCT 26.2* 29.5* 27.4* 28.8* 27.7*  MCV 100.0 100.3* 98.9 99.0 99.6  PLT 214 269 308 256 320   Cardiac Enzymes: No results for input(s): "CKTOTAL", "CKMB", "CKMBINDEX", "TROPONINI" in the last 168 hours. CBG: Recent Labs  Lab 06/10/23 1126 06/10/23 1603 06/10/23 1959 06/11/23 0759 06/11/23 1142  GLUCAP 142* 132* 156* 164* 164*    Studies/Results: No results found. Medications:   (feeding supplement) PROSource Plus  30 mL Oral BID BM   acidophilus  1 capsule Oral TID WC   allopurinol  100 mg Oral BID   atorvastatin  10 mg Oral QPM   busPIRone  5 mg Oral TID   cholestyramine light  4 g Oral BID   darbepoetin (ARANESP) injection - DIALYSIS  200 mcg  Subcutaneous Q Wed-1800   diclofenac Sodium  2 g Topical QID   famotidine  20 mg Oral QAC breakfast   ferric citrate  210 mg Oral TID WC   folic acid  1 mg Oral Daily   Gerhardt's butt cream   Topical BID   heparin  5,000 Units Subcutaneous Q8H   hyoscyamine  0.125 mg Sublingual TID   insulin aspart  0-6 Units Subcutaneous TID WC   loperamide  2 mg Oral Q6H   metoprolol tartrate  12.5 mg Oral BID   midodrine  10 mg Oral Q T,Th,Sa-HD   mirtazapine  15 mg Oral QHS   pantoprazole  40 mg Oral BID   promethazine  25 mg Oral TID AC & HS   risperiDONE  1 mg Oral QHS

## 2023-06-11 NOTE — Plan of Care (Signed)
   Problem: Education: Goal: Ability to describe self-care measures that may prevent or decrease complications (Diabetes Survival Skills Education) will improve Outcome: Completed/Met

## 2023-06-11 NOTE — Hospital Course (Signed)
 Subjective: Seen in room sitting on side of bed, has no complaints.  Noted yesterday seen in HD after having completed a late Thursday night treatment  she had refused HD Thursday day (verified by HD department )and she did refuse to have fistula cannulated .  Had discussion today about use of fistula we will use numbing med.  She states she will consider /as noted vascular okayed for cannulation  Objective Vital signs in last 24 hours: Vitals:   06/10/23 2000 06/11/23 0438 06/11/23 0756 06/11/23 0758  BP: 109/64 (!) 99/55  (!) 96/59  Pulse: 99 100  (!) 103  Resp: 18 18  18   Temp: 98 F (36.7 C) 97.7 F (36.5 C)  97.7 F (36.5 C)  TempSrc: Oral Oral  Oral  SpO2: 98% 100%  100%  Weight:   109.2 kg   Height:       Weight change:   Physical Exam: General: alert, NAD , in HD  Heart: RRR , No MRG Lungs: CTA ant. Nonlabored  Abdomen: NABS, obese, soft, NT,ND Extremities: 1+ bipedal  edema, R BKA Dialysis Access: LUA  AVF + bruit, TDC      OP Dialysis Orders: [facility]TS [facility] 4h   B400 101.5 kg 3K bath TDC   heparin none last HD 3/1 9, post weight 105.5 kg Not getting to dry weight, 4-8 kg over Mircera 225 mcg every 2 weeks Hectorol 4 mcg 3 times weekly     Problem/Plan: Chronic diarrhea / gastroparesis: improving, mgt per admitting team ESRD: on HD TTS.  Continue TTS schedue Vascular access - Has TDC, She does have LUE AVF transposed 12/31/22 and should be able to use but she has been reluctant.   Appears mature and ready for use.  reported pain in the fistula /US shows patient fistula, edema appears improved. Pain seems more related to her ribs/L side than her arm.  Noted VVS cleared AV fistula for cannulation per note/4/04 . I discussed risks of chronic TDC with pt. /Appreciate vascular surgery's input BP: chronic hypotension on midodrine at home. Cont midodrine.  Volume: she is above edw with ongoing edema, however bp dropping with UF. Tolerated 2L last HD, will try to challenge today   Anemia of esrd: 8.4, continue weekly aranesp Secondary hyperparathyroidism: CCa in range, phos 6.2<7.0, cont binders ac. Ongoing counseling regarding renal diet Paroxysmal A-fib= on metoprolol for rate control no anticoagulation secondary GI bleed 10/24 Chronic pain syndrome/chronic abdominal pain= meds per management team Chronic depression/anxiety= on Remeron, BuSpar, and hydroxyzine per admit team management DM type II= per admit team  Deborah Pastel, PA-C [facility] [phone] 06/11/2023,11:51 AM  LOS: 12 days   Labs: Basic Metabolic Panel: Recent Labs  Lab 06/05/23 1220 06/06/23 1415 06/08/23 0248 06/09/23 0252 06/10/23 0246  NA 134*   < > 134* 132* 137  K 3.6   < > 3.8 4.0 3.5  CL 100   < > 98 95* 100  CO2 25   < > 26 23 26   GLUCOSE 190*   < > 201* 172* 117*  BUN 21*   < > 22* 36* 33*  CREATININE 4.76*   < > 4.60* 5.72* 5.20*  CALCIUM 7.9*   < > 8.5* 8.6* 8.1*  PHOS 5.7*  --   --  7.0* 6.2*   < > = values in this interval not displayed.   Liver Function Tests: Recent Labs  Lab 06/05/23 1220 06/09/23 0252 06/10/23 0246  ALBUMIN 2.1* 2.1* 1.9*  No results for input(s): "LIPASE", "AMYLASE" in the last 168 hours. No results for input(s): "AMMONIA" in the last 168 hours. CBC: Recent Labs  Lab 06/05/23 1220 06/06/23 1415 06/07/23 0343 06/08/23 0248 06/10/23 2136  WBC 8.7 9.4 11.0* 9.8 12.2*  HGB 7.8* 8.7* 8.4* 8.5* 8.4*  HCT 26.2* 29.5* 27.4* 28.8* 27.7*  MCV 100.0 100.3* 98.9 99.0 99.6  PLT 214 269 308 256 320   Cardiac Enzymes: No results for input(s): "CKTOTAL", "CKMB", "CKMBINDEX", "TROPONINI" in the last 168 hours. CBG: Recent Labs  Lab 06/10/23 1126 06/10/23 1603 06/10/23 1959 06/11/23 0759 06/11/23 1142  GLUCAP 142* 132* 156* 164* 164*    Studies/Results: No results found. Medications:   (feeding supplement) PROSource Plus  30 mL Oral BID BM   acidophilus  1 capsule Oral TID WC   allopurinol  100 mg Oral BID    atorvastatin  10 mg Oral QPM   busPIRone  5 mg Oral TID   cholestyramine light  4 g Oral BID   darbepoetin (ARANESP) injection - DIALYSIS  200 mcg Subcutaneous Q Wed-1800   diclofenac Sodium  2 g Topical QID   famotidine  20 mg Oral QAC breakfast   ferric citrate  210 mg Oral TID WC   folic acid  1 mg Oral Daily   Gerhardt's butt cream   Topical BID   heparin  5,000 Units Subcutaneous Q8H   hyoscyamine  0.125 mg Sublingual TID   insulin aspart  0-6 Units Subcutaneous TID WC   loperamide  2 mg Oral Q6H   metoprolol tartrate  12.5 mg Oral BID   midodrine  10 mg Oral Q T,Th,Sa-HD   mirtazapine  15 mg Oral QHS   pantoprazole  40 mg Oral BID   promethazine  25 mg Oral TID AC & HS   risperiDONE  1 mg Oral QHS

## 2023-06-11 NOTE — Progress Notes (Signed)
 Progress Note   Patient: Deborah [name] UJW:[id number] DOB: 06-25-1965 DOA: 05/29/2023     12 DOS: the patient was seen and examined on 06/11/2023   Brief hospital course: Deborah [name] was admitted to the hospital with the working diagnosis of acute on chronic gastroparesis.   58 yrs old female with ESRD on HD TTS, gastroparesis, GERD, PAF not on AC, diastolic CHF with preserved EF 55 to 60%, gout, duodenal carcinoid tumor, PVD s/p right-sided BKA, history of gastric AVM, NASH, depression, anxiety, obesity, restrictive lung disease, spinal stenosis, chronic pain syndrome,T2DM, chronic left heel diabetic ulcer, and history of chronic diarrhea in the setting of carcinoid tumor who presented to ED with complaints of diarrhea in setting of C.Diff and abdominal pain.  Patient has missed hemodialysis because of having intractable nausea and vomiting.  Patient has been taking oral vancomycin for C. Difficile+.   On her initial physical examination her blood pressure was 131/71, HR 89, RR 16 and 02 saturation 100%. Lungs with no wheezing or rales, no rhonchi, heart with S1 and S2 present and regular with no gallops, rubs or murmurs, abdomen protuberant, with generalized tenderness to palpation, with no rebound or guarding, positive lower extremity edema, right BKA.   CT A&P no acute findings.    Patient is admitted for fluid overload due to missed hemodialysis due to diarrhea and intractable nausea and vomiting.   04/04 decrease frequency of IV hydromorphone. Pending placement in SNF   Assessment and Plan: * Gastroparesis Gastrointestinal symptoms are improving.  Continue with pre meal phenergan po .   Plan to continue supportive medical therapy with as needed antiemetics.  Scheduled antiacids.   ESRD on dialysis (HCC) Hyponatremia. Hyperphosphatemia.   Patient with signs of volume overload, she has peripheral edema.  She has been not compliant with NaCl restricted diet.  Continue  inpatient renal replacement therapy per nephrology recommendations.  She has left upper extremity fistula with appropriate functioning.   Scheduled for HD today per her routine T-TH-SAT, schedule    Anemia of chronic renal disease. Continue with EPO.  Acute on chronic diastolic CHF (congestive heart failure) (HCC) Volume overload in the setting of ESRD. Patient not compliant with HD as outpatient.  Plan to continue ultrafiltration on HD.   Continue blood pressure support with midodrine. Continue metoprolol.    Paroxysmal atrial fibrillation (HCC) Continue metoprolol for rate control.  Patient is off anticoagulation with to  history of GI bleeding in 10/24   Off telemetry   Chronic pain syndrome/chronic abdominal pain Left shoulder pain has improved with topical diclofenac, continue with muscle relaxants and analgesics.   Encourage mobility.  Continue IV hydromorphone to every 5 hrs as needed.  Will increase frequency of hydroxyzine and will change methocarbamol to scheduled tid.    Patient having depression and anxiety symptoms that likely are exacerbating her pain, she is in agreement in psychiatry consultation. Add trazodone for pain control.   Insulin dependent type 2 diabetes mellitus (HCC) Continue glucose cover and monitoring with insulin sliding scale  Patient is having better toleration to po.   Chronic diarrhea with history of C.Diff C diff has resolved.   Malignant carcinoid tumor of duodenum [facility]) S/p excision  Patient has intermittent diarrhea, continue lomotil as needed.   GERD (gastroesophageal reflux disease) Continue antiacid therapy. As needed antiemetics.   Chronic depression/anxiety Continue remeron, buspar and hydroxyzine  Chronic pain syndrome.  Patient with uncontrolled disease, will consult psychiatry for further recommendations.   Chronic gout -Continue  allopurinol 100 mg twice daily.   Chronic wound of left heel Wound consult    Obesity, class 2 Calculated BMI 38.7       Subjective: patient reports improved nausea, no vomiting. She had diarrhea last night and not able to sleep. Generalized pain has been stable, with occasional exacerbations.   Physical Exam: Vitals:   06/10/23 2000 06/11/23 0438 06/11/23 0756 06/11/23 0758  BP: 109/64 (!) 99/55  (!) 96/59  Pulse: 99 100  (!) 103  Resp: 18 18  18   Temp: 98 F (36.7 C) 97.7 F (36.5 C)  97.7 F (36.5 C)  TempSrc: Oral Oral  Oral  SpO2: 98% 100%  100%  Weight:   109.2 kg   Height:       Neurology awake and alert deconditioned  ENT with mild pallor Cardiovascular with S1 and S2 present and regular with no gallops or rubs Respiratory with no rales or wheezing, no rhonchi  Abdomen with no distention  Positive bilateral lower extremity edema +++ right BKA   Data Reviewed:    Family Communication: no family at the bedside   Disposition: Status is: Inpatient Remains inpatient appropriate because: pending transfer to SNF   Planned Discharge Destination: Skilled nursing facility     Author: Coralie Keens, MD 06/11/2023 5:16 PM  For on call review [email or web address].

## 2023-06-12 DIAGNOSIS — K3184 Gastroparesis: Secondary | ICD-10-CM | POA: Diagnosis not present

## 2023-06-12 DIAGNOSIS — N186 End stage renal disease: Secondary | ICD-10-CM | POA: Diagnosis not present

## 2023-06-12 DIAGNOSIS — I5033 Acute on chronic diastolic (congestive) heart failure: Secondary | ICD-10-CM | POA: Diagnosis not present

## 2023-06-12 DIAGNOSIS — I480 Paroxysmal atrial fibrillation: Secondary | ICD-10-CM | POA: Diagnosis not present

## 2023-06-12 LAB — RENAL FUNCTION PANEL
Albumin: 2.1 g/dL — ABNORMAL LOW (ref 3.5–5.0)
Anion gap: 15 (ref 5–15)
BUN: 62 mg/dL — ABNORMAL HIGH (ref 6–20)
CO2: 19 mmol/L — ABNORMAL LOW (ref 22–32)
Calcium: 8.4 mg/dL — ABNORMAL LOW (ref 8.9–10.3)
Chloride: 99 mmol/L (ref 98–111)
Creatinine, Ser: 7.86 mg/dL — ABNORMAL HIGH (ref 0.44–1.00)
GFR, Estimated: 5 mL/min — ABNORMAL LOW (ref >60)
Glucose, Bld: 228 mg/dL — ABNORMAL HIGH (ref 70–99)
Phosphorus: 8 mg/dL — ABNORMAL HIGH (ref 2.5–4.6)
Potassium: 4.6 mmol/L (ref 3.5–5.1)
Sodium: 133 mmol/L — ABNORMAL LOW (ref 135–145)

## 2023-06-12 LAB — GLUCOSE, CAPILLARY
Glucose-Capillary: 87 mg/dL (ref 70–99)
Glucose-Capillary: 122 mg/dL — ABNORMAL HIGH (ref 70–99)
Glucose-Capillary: 102 mg/dL — ABNORMAL HIGH (ref 70–99)
Glucose-Capillary: 100 mg/dL — ABNORMAL HIGH (ref 70–99)

## 2023-06-12 NOTE — Progress Notes (Signed)
 Progress Note   Patient: Deborah [name] ZOX:[id number] DOB: Feb 01, 1966 DOA: 05/29/2023     13 DOS: the patient was seen and examined on 06/12/2023   Brief hospital course: Deborah [name] was admitted to the hospital with the working diagnosis of acute on chronic gastroparesis.   58 yrs old female with ESRD on HD TTS, gastroparesis, GERD, PAF not on AC, diastolic CHF with preserved EF 55 to 60%, gout, duodenal carcinoid tumor, PVD s/p right-sided BKA, history of gastric AVM, NASH, depression, anxiety, obesity, restrictive lung disease, spinal stenosis, chronic pain syndrome,T2DM, chronic left heel diabetic ulcer, and history of chronic diarrhea in the setting of carcinoid tumor who presented to ED with complaints of diarrhea in setting of C.Diff and abdominal pain.  Patient has missed hemodialysis because of having intractable nausea and vomiting.  Patient has been taking oral vancomycin for C. Difficile+.   On her initial physical examination her blood pressure was 131/71, HR 89, RR 16 and 02 saturation 100%. Lungs with no wheezing or rales, no rhonchi, heart with S1 and S2 present and regular with no gallops, rubs or murmurs, abdomen protuberant, with generalized tenderness to palpation, with no rebound or guarding, positive lower extremity edema, right BKA.   CT A&P no acute findings.    Patient is admitted for fluid overload due to missed hemodialysis due to diarrhea and intractable nausea and vomiting.   04/04 decrease frequency of IV hydromorphone. Pending placement in SNF   Assessment and Plan: * Gastroparesis Gastrointestinal symptoms are improving.  Continue with pre meal phenergan po .   Plan to continue supportive medical therapy with as needed antiemetics.  Scheduled antiacids.   ESRD on dialysis (HCC) Hyponatremia. Hyperphosphatemia.   Patient with signs of volume overload, she has peripheral edema.  She has been not compliant with NaCl restricted diet.  Continue  inpatient renal replacement therapy per nephrology recommendations.  She has left upper extremity fistula with appropriate functioning.   Scheduled for HD today per her routine T-TH-SAT, schedule    Anemia of chronic renal disease. Continue with EPO.  Acute on chronic diastolic CHF (congestive heart failure) (HCC) Volume overload in the setting of ESRD. Patient not compliant with HD as outpatient.  Plan to continue ultrafiltration on HD.   Continue blood pressure support with midodrine. Continue metoprolol.    Paroxysmal atrial fibrillation (HCC) Continue metoprolol for rate control.  Patient is off anticoagulation with to  history of GI bleeding in 10/24   Off telemetry   Chronic pain syndrome/chronic abdominal pain Left shoulder pain has improved with topical diclofenac, continue with muscle relaxants and analgesics.   Encourage mobility.  Continue IV hydromorphone to every 5 hrs as needed.  Continue with hydroxyzine and scheduled methocarbamol tid.    Patient having depression and anxiety symptoms that likely are exacerbating her pain, she is in agreement in psychiatry consultation. Continue with trazodone for insomnia.  Consulting psychiatry tomorrow Monday.   Insulin dependent type 2 diabetes mellitus (HCC) Continue glucose cover and monitoring with insulin sliding scale  Patient is having better toleration to po.   Chronic diarrhea with history of C.Diff C diff has resolved.   Malignant carcinoid tumor of duodenum [facility]) S/p excision  Patient has intermittent diarrhea, continue lomotil as needed.   GERD (gastroesophageal reflux disease) Continue antiacid therapy. As needed antiemetics.   Chronic depression/anxiety Continue remeron, buspar and hydroxyzine  Chronic pain syndrome.  Patient with uncontrolled disease, will consult psychiatry for further recommendations.   Chronic gout -Continue  allopurinol 100 mg twice daily.   Chronic wound of left heel Wound  consult   Obesity, class 2 Calculated BMI 38.7         Subjective: Patient has been crying, intermittent pain and diarrhea, she was not able to sleep last night. Declined HD yesterday   Physical Exam: Vitals:   06/11/23 0758 06/11/23 2021 06/12/23 0326 06/12/23 0810  BP: (!) 96/59 110/66 118/76 111/79  Pulse: (!) 103 (!) 102 (!) 101 (!) 101  Resp: 18 18 18 18   Temp: 97.7 F (36.5 C) 98 F (36.7 C) 97.8 F (36.6 C) 97.7 F (36.5 C)  TempSrc: Oral Oral Oral Oral  SpO2: 100% 100% 100% 100%  Weight:      Height:       Neurology awake and alert ENT with mild pallor  Cardiovascular with S1 and S2 present and regular with no gallops, or rubs Respiratory with no rhonchi or wheezing Abdomen with no distention  Positive bilateral lower extremity edema +++, positive right BKA  Data Reviewed:    Family Communication: no family at the bedside   Disposition: Status is: Inpatient Remains inpatient appropriate because: pending SNF she is medically stable   Planned Discharge Destination: Skilled nursing facility    Author: Coralie Keens, MD 06/12/2023 2:29 PM  For on call review [email or web address].

## 2023-06-12 NOTE — Progress Notes (Addendum)
 Subjective: Seen in room, sitting on side of bed, yesterday noted per nursing staff refused HD yesterday. "  Again". this a.m. patient stated she did not refuse HD, but was having a panic attack and they needed to come back to check after her attack was over.  Denies any shortness of breath or chest pain currently agrees to dialysis in the morning. No sob and k  has been stable recently   Objective Vital signs in last 24 hours: Vitals:   06/11/23 0758 06/11/23 2021 06/12/23 0326 06/12/23 0810  BP: (!) 96/59 110/66 118/76 111/79  Pulse: (!) 103 (!) 102 (!) 101 (!) 101  Resp: 18 18 18 18   Temp: 97.7 F (36.5 C) 98 F (36.7 C) 97.8 F (36.6 C) 97.7 F (36.5 C)  TempSrc: Oral Oral Oral Oral  SpO2: 100% 100% 100% 100%  Weight:      Height:       Weight change:   Physical Exam: General: alert, NAD  Heart: RRR , No MRG Lungs: CTA ant. Nonlabored  Abdomen: NABS, obese, soft, NT,ND Extremities: 1+ bipedal  edema, R BKA Dialysis Access: LUA  AVF + bruit, TDC      OP Dialysis Orders: [facility] 4h   B400 101.5 kg 3K bath TDC   heparin none last HD 3/1 9, post weight 105.5 kg Not getting to dry weight, 4-8 kg over Mircera 225 mcg every 2 weeks Hectorol 4 mcg 3 times weekly     Problem/Plan: Chronic diarrhea / gastroparesis: improving, mgt per admitting team ESRD: on HD TTS.  Continue TTS schedue currently off schedule because of refusing, will continue MWF for now (noted refusal of dialysis twice this week, admit team to have psych see patient ) today patient does wish to continue dialysis as discussed with her. Vascular access - Has TDC, She does have LUE AVF transposed 12/31/22,  she has been reluctant to use AVF .  Noted VVS notes 4/04 okayed to cannulate  , appears mature and ready for use.  She reported in past pain in AV fistula, pain seems more related to her ribs/L side than her arm.  Denies any this a.m. discussed risks of chronic TDC with pt. multiple times and need to use AVF,  appreciate vascular surgery's input BP: chronic hypotension on midodrine at home. Cont midodrine.  Volume: she is above edw with ongoing edema, however bp dropping with UF. Tolerated 2L last HD, will try to challenge UF as able Anemia of esrd: 8.4, continue weekly aranesp 200 mcg  Secondary hyperparathyroidism: CCa in range, phos 6.2 , cont binders ac.  Proximal A-fib= on metoprolol for rate control/ no anticoagulation secondary to GI bleed October 2024 Diabetes type 2= admit team Chronic pain syndrome/chronic abdominal pain= on meds per admit team, including noted yesterday decreasing IV morphine to every 5 hours as needed Depression/anxiety= per admit team Remeron, BuSpar ,hydroxyzine    Lenny Pastel, PA-C [facility] Beeper [phone] 06/12/2023,11:32 AM  LOS: 13 days   Labs: Basic Metabolic Panel: Recent Labs  Lab 06/05/23 1220 06/06/23 1415 06/08/23 0248 06/09/23 0252 06/10/23 0246  NA 134*   < > 134* 132* 137  K 3.6   < > 3.8 4.0 3.5  CL 100   < > 98 95* 100  CO2 25   < > 26 23 26   GLUCOSE 190*   < > 201* 172* 117*  BUN 21*   < > 22* 36* 33*  CREATININE 4.76*   < >  4.60* 5.72* 5.20*  CALCIUM 7.9*   < > 8.5* 8.6* 8.1*  PHOS 5.7*  --   --  7.0* 6.2*   < > = values in this interval not displayed.   Liver Function Tests: Recent Labs  Lab 06/05/23 1220 06/09/23 0252 06/10/23 0246  ALBUMIN 2.1* 2.1* 1.9*   No results for input(s): "LIPASE", "AMYLASE" in the last 168 hours. No results for input(s): "AMMONIA" in the last 168 hours. CBC: Recent Labs  Lab 06/05/23 1220 06/06/23 1415 06/07/23 0343 06/08/23 0248 06/10/23 2136  WBC 8.7 9.4 11.0* 9.8 12.2*  HGB 7.8* 8.7* 8.4* 8.5* 8.4*  HCT 26.2* 29.5* 27.4* 28.8* 27.7*  MCV 100.0 100.3* 98.9 99.0 99.6  PLT 214 269 308 256 320   Cardiac Enzymes: No results for input(s): "CKTOTAL", "CKMB", "CKMBINDEX", "TROPONINI" in the last 168 hours. CBG: Recent Labs  Lab 06/11/23 0759 06/11/23 1142  06/11/23 1749 06/11/23 2019 06/12/23 0812  GLUCAP 164* 164* 151* 132* 87    Studies/Results: No results found. Medications:  anticoagulant sodium citrate      (feeding supplement) PROSource Plus  30 mL Oral BID BM   acidophilus  1 capsule Oral TID WC   allopurinol  100 mg Oral BID   atorvastatin  10 mg Oral QPM   busPIRone  5 mg Oral TID   cholestyramine light  4 g Oral BID   darbepoetin (ARANESP) injection - DIALYSIS  200 mcg Subcutaneous Q Wed-1800   diclofenac Sodium  2 g Topical QID   famotidine  20 mg Oral QAC breakfast   ferric citrate  210 mg Oral TID WC   folic acid  1 mg Oral Daily   Gerhardt's butt cream   Topical BID   heparin  5,000 Units Subcutaneous Q8H   hyoscyamine  0.125 mg Sublingual TID   insulin aspart  0-6 Units Subcutaneous TID WC   loperamide  2 mg Oral Q6H   methocarbamol  500 mg Oral TID   metoprolol tartrate  12.5 mg Oral BID   midodrine  10 mg Oral Q T,Th,Sa-HD   mirtazapine  15 mg Oral QHS   pantoprazole  40 mg Oral BID   promethazine  25 mg Oral TID AC & HS   risperiDONE  1 mg Oral QHS   traZODone  50 mg Oral QHS

## 2023-06-12 NOTE — Consult Note (Signed)
 Please note that the [facility] nursing team is utilizing a standardized work plan to manage patient consults. We are triaging consults and will try to see the patients within 48 hours. Wound photos in the patient's chart allow [place] to consult on the patient in the most efficient and timely manner.    Guneet Delpino [facility], CNS, [employer] [phone]

## 2023-06-13 DIAGNOSIS — K3184 Gastroparesis: Secondary | ICD-10-CM | POA: Diagnosis not present

## 2023-06-13 DIAGNOSIS — I480 Paroxysmal atrial fibrillation: Secondary | ICD-10-CM | POA: Diagnosis not present

## 2023-06-13 DIAGNOSIS — I5033 Acute on chronic diastolic (congestive) heart failure: Secondary | ICD-10-CM | POA: Diagnosis not present

## 2023-06-13 DIAGNOSIS — N186 End stage renal disease: Secondary | ICD-10-CM | POA: Diagnosis not present

## 2023-06-13 LAB — RENAL FUNCTION PANEL
Albumin: 2.3 g/dL — ABNORMAL LOW (ref 3.5–5.0)
Anion gap: 16 — ABNORMAL HIGH (ref 5–15)
BUN: 66 mg/dL — ABNORMAL HIGH (ref 6–20)
CO2: 19 mmol/L — ABNORMAL LOW (ref 22–32)
Calcium: 8.8 mg/dL — ABNORMAL LOW (ref 8.9–10.3)
Chloride: 101 mmol/L (ref 98–111)
Creatinine, Ser: 8.19 mg/dL — ABNORMAL HIGH (ref 0.44–1.00)
GFR, Estimated: 5 mL/min — ABNORMAL LOW (ref >60)
Glucose, Bld: 102 mg/dL — ABNORMAL HIGH (ref 70–99)
Phosphorus: 8.2 mg/dL — ABNORMAL HIGH (ref 2.5–4.6)
Potassium: 4.7 mmol/L (ref 3.5–5.1)
Sodium: 136 mmol/L (ref 135–145)

## 2023-06-13 LAB — GLUCOSE, CAPILLARY
Glucose-Capillary: 91 mg/dL (ref 70–99)
Glucose-Capillary: 119 mg/dL — ABNORMAL HIGH (ref 70–99)
Glucose-Capillary: 83 mg/dL (ref 70–99)
Glucose-Capillary: 84 mg/dL (ref 70–99)

## 2023-06-13 MED ORDER — FERRIC CITRATE 1 GM 210 MG(FE) PO TABS
420.0000 mg | ORAL_TABLET | Freq: Three times a day (TID) | ORAL | Status: DC
Start: 2023-06-13 — End: 2023-06-20
  Administered 2023-06-13 – 2023-06-20 (×12): 420 mg via ORAL
  Filled 2023-06-13 (×17): qty 2

## 2023-06-13 MED ORDER — DULOXETINE HCL 20 MG PO CPEP
20.0000 mg | ORAL_CAPSULE | Freq: Every day | ORAL | Status: DC
  Administered 2023-06-13 – 2023-06-15 (×3): 20 mg via ORAL
  Filled 2023-06-13 (×3): qty 1

## 2023-06-13 NOTE — Progress Notes (Signed)
 [name] Progress Note   Subjective:   Seen in room - she is frustrated, trying to wash and has spilled the basin. Denies CP/dyspnea. For HD today and she is agreeable.  Objective Vitals:   06/12/23 1832 06/12/23 2027 06/12/23 2046 06/13/23 0427  BP: 126/68 134/68 134/68 103/76  Pulse: (!) 102 (!) 105 (!) 105 (!) 109  Resp:  18  18  Temp: 98 F (36.7 C) (!) 97.3 F (36.3 C)  98.2 F (36.8 C)  TempSrc: Oral Oral  Oral  SpO2: 100% 100%  100%  Weight:      Height:       Physical Exam General: Chronically ill appearing woman, NAD. Nasal O2 in place Heart: RRR Lungs: CTA Extremities: R BKA, LLE bandaged Dialysis Access: TDC in R chest, LUE AVF  Additional Objective Labs: Basic Metabolic Panel: Recent Labs  Lab 06/10/23 0246 06/12/23 1401 06/13/23 0459  NA 137 133* 136  K 3.5 4.6 4.7  CL 100 99 101  CO2 26 19* 19*  GLUCOSE 117* 228* 102*  BUN 33* 62* 66*  CREATININE 5.20* 7.86* 8.19*  CALCIUM 8.1* 8.4* 8.8*  PHOS 6.2* 8.0* 8.2*   Liver Function Tests: Recent Labs  Lab 06/10/23 0246 06/12/23 1401 06/13/23 0459  ALBUMIN 1.9* 2.1* 2.3*   CBC: Recent Labs  Lab 06/06/23 1415 06/07/23 0343 06/08/23 0248 06/10/23 2136  WBC 9.4 11.0* 9.8 12.2*  HGB 8.7* 8.4* 8.5* 8.4*  HCT 29.5* 27.4* 28.8* 27.7*  MCV 100.3* 98.9 99.0 99.6  PLT 269 308 256 320   Medications:  anticoagulant sodium citrate      (feeding supplement) PROSource Plus  30 mL Oral BID BM   acidophilus  1 capsule Oral TID WC   allopurinol  100 mg Oral BID   atorvastatin  10 mg Oral QPM   busPIRone  5 mg Oral TID   cholestyramine light  4 g Oral BID   darbepoetin (ARANESP) injection - DIALYSIS  200 mcg Subcutaneous Q Wed-1800   diclofenac Sodium  2 g Topical QID   famotidine  20 mg Oral QAC breakfast   ferric citrate  210 mg Oral TID WC   folic acid  1 mg Oral Daily   Gerhardt's butt cream   Topical BID   heparin  5,000 Units Subcutaneous Q8H   hyoscyamine  0.125 mg  Sublingual TID   insulin aspart  0-6 Units Subcutaneous TID WC   loperamide  2 mg Oral Q6H   methocarbamol  500 mg Oral TID   metoprolol tartrate  12.5 mg Oral BID   midodrine  10 mg Oral Q T,Th,Sa-HD   mirtazapine  15 mg Oral QHS   pantoprazole  40 mg Oral BID   promethazine  25 mg Oral TID AC & HS   risperiDONE  1 mg Oral QHS   traZODone  50 mg Oral QHS    Dialysis Orders TTS - [facility] 4hr, 400/800, EDW 101.5kg, 3K/2.5Ca bath, TDC, no heparin - Mircera IV q 2 weeks - last given 3/15 - Hectoral IV q HD  Assessment/Plan: Gastroparesis + chronic diarrhea: Improving. Recent C.diff - negative here. ESRD: Usual TTS schedule - has refused HD at least 2X, so off schedule this admit. Agreeable for HD today. Vascular access: Refusing AVF use. VVS consulted - ok'd for use as of 4/4. TDC in place. L heel wound: Wound care following. HypoTN/volume: BP chronically soft. Uses midodrine pre-HD. Remains way above her prior EDW. Anemia of ESRD: Hgb  8.4 - continue Aranesp q Wed while here. Secondary HPTH: Ca ok, Phos high - continue Auryxia as binder, ^ dose. Nutrition: Albumin low, continue protein supps. T2DM A-fib: Not on [facility] d/t recent GIB Chronic pain syndrome Mood disorder/depression   Ozzie Hoyle, PA-C 06/13/2023, 9:04 AM  [employer]

## 2023-06-13 NOTE — Progress Notes (Addendum)
 Progress Note   Patient: Deborah [name] VFI:[id number] DOB: May 06, 1965 DOA: 05/29/2023     14 DOS: the patient was seen and examined on 06/13/2023   Brief hospital course: Mrs. Bordeau was admitted to the hospital with the working diagnosis of acute on chronic gastroparesis.   58 yrs old female with ESRD on HD TTS, gastroparesis, GERD, PAF not on AC, diastolic CHF with preserved EF 55 to 60%, gout, duodenal carcinoid tumor, PVD s/p right-sided BKA, history of gastric AVM, NASH, depression, anxiety, obesity, restrictive lung disease, spinal stenosis, chronic pain syndrome,T2DM, chronic left heel diabetic ulcer, and history of chronic diarrhea in the setting of carcinoid tumor who presented to ED with complaints of diarrhea in setting of C.Diff and abdominal pain.  Patient has missed hemodialysis because of having intractable nausea and vomiting.  Patient has been taking oral vancomycin for C. Difficile+.   On her initial physical examination her blood pressure was 131/71, HR 89, RR 16 and 02 saturation 100%. Lungs with no wheezing or rales, no rhonchi, heart with S1 and S2 present and regular with no gallops, rubs or murmurs, abdomen protuberant, with generalized tenderness to palpation, with no rebound or guarding, positive lower extremity edema, right BKA.   CT A&P no acute findings.    Patient is admitted for fluid overload due to missed hemodialysis due to diarrhea and intractable nausea and vomiting.   04/04 decrease frequency of IV hydromorphone. Pending placement in SNF\ 04/07 consulted psychiatry    Assessment and Plan: * Gastroparesis Gastrointestinal symptoms are improving.  Continue with pre meal phenergan po .   Plan to continue supportive medical therapy with as needed antiemetics.  Scheduled antiacids.   ESRD on dialysis (HCC) Hyponatremia. Hyperphosphatemia.   Patient with signs of volume overload, she has peripheral edema.  She has been not compliant with NaCl  restricted diet.  Continue inpatient renal replacement therapy per nephrology recommendations.  She has left upper extremity fistula with appropriate functioning.   Scheduled for HD today per her routine T-TH-SAT, schedule    Anemia of chronic renal disease. Continue with EPO.  Acute on chronic diastolic CHF (congestive heart failure) (HCC) Volume overload in the setting of ESRD. Patient not compliant with HD as outpatient.  Plan to continue ultrafiltration on HD.   Continue blood pressure support with midodrine. Continue metoprolol.    Paroxysmal atrial fibrillation (HCC) Continue metoprolol for rate control.  Patient is off anticoagulation with to  history of GI bleeding in 10/24   Off telemetry   Chronic pain syndrome/chronic abdominal pain Left shoulder pain has improved with topical diclofenac, continue with muscle relaxants and analgesics.   Encourage mobility.  Continue IV hydromorphone to every 5 hrs as needed, will plan to transition to po tomorrow.   Continue with hydroxyzine and scheduled methocarbamol tid.    Patient having depression and anxiety symptoms that likely are exacerbating her pain, she is in agreement in psychiatry consultation. Continue with trazodone for insomnia.  Follow up with psychiatry recommendations.   Insulin dependent type 2 diabetes mellitus (HCC) Continue glucose cover and monitoring with insulin sliding scale  Patient is having better toleration to po.   Chronic diarrhea with history of C.Diff C diff has resolved.   Malignant carcinoid tumor of duodenum [facility]) S/p excision  Patient has intermittent diarrhea, continue lomotil as needed.   GERD (gastroesophageal reflux disease) Continue antiacid therapy. As needed antiemetics.   Chronic depression/anxiety Continue remeron, buspar and hydroxyzine  Chronic pain syndrome.  Patient with  uncontrolled disease, follow up with psychiatry recommendations.   Chronic gout -Continue  allopurinol 100 mg twice daily.   Chronic wound of left heel Wound consult   Obesity, class 2 Calculated BMI 38.7       Subjective: Patient today did not complain of chest pain or dyspnea at the time of my visit, she has been getting analgesics   Physical Exam: Vitals:   06/13/23 1509 06/13/23 1530 06/13/23 1600 06/13/23 1630  BP:  (!) 91/48 (!) 97/54 116/64  Pulse: 89 84 83 87  Resp: 15 19 19 17   Temp:      TempSrc:      SpO2: 100% 99% 99% 100%  Weight:      Height:       Neurology awake and alert ENT with mild pallor Cardiovascular with S1 and S2 present and regular with no gallops or rubs Respiratory with no wheezing or rhonchi Abdomen with no distention  Positive lower extremity edema, positive right BKA  Data Reviewed:    Family Communication: no family at the bedside   Disposition: Status is: Inpatient Remains inpatient appropriate because: pending placement SNF   Planned Discharge Destination: Skilled nursing facility     Author: Coralie Keens, MD 06/13/2023 4:58 PM  For on call review [email or web address].

## 2023-06-13 NOTE — Consult Note (Signed)
 This patient is very familiar to [facility] team from many previous admissions. Patient has chronic L foot wound that is managed by DR. [facility] outpatient. Dr. Lajoyce Corners evaluated this foot 05/30/2023 and placed wound care orders for this wound.   Per Dr. Burna Sis note 05/30/2023 Patient has a [address] grade 1 ulcer over the lateral aspect of the left heel. This has healthy granulation tissue at the base 1 cm in diameter with no drainage no tunneling   Dr. Lajoyce Corners placed wound care order as follows:  Will place an order for Vashe dressing changes to the left heel to be changed daily   If this L heel needs further assessment or wound care orders please reconsult Dr. Lajoyce Corners.   Thank you,    Priscella Mann MSN, RN-BC, [employer] [phone]
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
## 2023-06-13 NOTE — Procedures (Signed)
 Pt came to unit with plans to cannulate . Pt refused to use AVF today. Pt states that she doesn't know anyone today on the unit. I explained to her that staff was capable of cannulating her and that there was numbing spray for her to help with any pain. Pt still refused to be cannulated. She said she would think about it for her next treatment, but she did not agree to that today

## 2023-06-13 NOTE — Progress Notes (Signed)
 Received patient in bed to unit.  Alert and oriented.  Informed consent signed and in chart.   TX duration:  Patient tolerated well.  Transported back to the room  Alert, without acute distress.  Hand-off given to patient's nurse.   Access used: Right chest [facility] Access issues: None  Total UF removed: 1000 Medication(s) given: See [facility]   06/13/23 1740  Vitals  Temp 97.7 F (36.5 C)  Pulse Rate 98  Resp 18  BP (!) 120/54  SpO2 100 %  Post Treatment  Dialyzer Clearance Clear  Liters Processed 29.4  Fluid Removed (mL) 1000 mL  Post-Hemodialysis Comments Pt ended tx early, VS stable

## 2023-06-13 NOTE — Progress Notes (Signed)
 Received a call from Kim with [facility] out-pt HD. Pt has been accepted by a nephrologist for [facility] due to snf placement. However, pt will require financial clearance due to pt's insurance being out of network with [facility]. Staff from [facility] are supposed to contact pt to discuss financial situation further. Update provided to CSW. Will assist as needed.   Olivia Canter Renal Navigator [phone]

## 2023-06-13 NOTE — TOC Progression Note (Signed)
 Transition of Care [facility]) - Progression Note    Patient Details  Name: Deborah [name] MRN: [id number] Date of Birth: [birth year]  Transition of Care [facility]) CM/SW Contact  Mearl Latin, LCSW Phone Number: 06/13/2023, 12:11 PM  Clinical Narrative:    CSW provided update to [place].   Expected Discharge Plan: Skilled Nursing Facility Barriers to Discharge: Continued Medical Work up, Other (must enter comment) (Awaiting PT/OT to see)  Expected Discharge Plan and Services In-house Referral: Clinical Social Work     Living arrangements for the past 2 months: Skilled Holiday representative                   DME Agency: NA                   Social Determinants of Health (SDOH) Interventions SDOH Screenings   Food Insecurity: No Food Insecurity (05/29/2023)  Housing: Patient Declined (05/29/2023)  Recent Concern: Housing - High Risk (04/25/2023)  Transportation Needs: Unmet Transportation Needs (05/29/2023)  Utilities: Not At Risk (05/29/2023)  Depression (PHQ2-9): Medium Risk (03/16/2023)  Financial Resource Strain: Low Risk (03/23/2021)   Received from [facility])  Physical Activity: Not on File (10/31/2017)   Received from [place], [place]  Social Connections: Unknown (07/19/2021)   [facility] from [facility], [facility]  Stress: Low Risk (03/23/2021)   [facility])  Tobacco Use: Low Risk  (05/29/2023)    Readmission Risk Interventions    03/07/2023    4:19 PM 11/25/2022    5:16 PM 07/05/2022    1:20 PM  Readmission Risk Prevention Plan  Transportation Screening Complete Complete Complete  Medication Review (RN Care Manager) Complete Complete Referral to Pharmacy  PCP or Specialist appointment within 3-5 days of discharge Complete Complete Complete  HRI or Home Care Consult Complete Complete Complete  SW Recovery Care/Counseling Consult Complete Complete Complete  Palliative Care  Screening Complete Not Applicable Not Applicable  Skilled Nursing Facility Not Applicable Not Applicable Not Applicable

## 2023-06-14 ENCOUNTER — Ambulatory Visit: Admitting: Physician Assistant

## 2023-06-14 DIAGNOSIS — K3184 Gastroparesis: Secondary | ICD-10-CM | POA: Diagnosis not present

## 2023-06-14 DIAGNOSIS — N186 End stage renal disease: Secondary | ICD-10-CM | POA: Diagnosis not present

## 2023-06-14 DIAGNOSIS — I5033 Acute on chronic diastolic (congestive) heart failure: Secondary | ICD-10-CM | POA: Diagnosis not present

## 2023-06-14 DIAGNOSIS — I480 Paroxysmal atrial fibrillation: Secondary | ICD-10-CM | POA: Diagnosis not present

## 2023-06-14 LAB — GLUCOSE, CAPILLARY
Glucose-Capillary: 98 mg/dL (ref 70–99)
Glucose-Capillary: 122 mg/dL — ABNORMAL HIGH (ref 70–99)
Glucose-Capillary: 142 mg/dL — ABNORMAL HIGH (ref 70–99)
Glucose-Capillary: 146 mg/dL — ABNORMAL HIGH (ref 70–99)

## 2023-06-14 MED ORDER — HYDROMORPHONE HCL 1 MG/ML IJ SOLN
1.0000 mg | INTRAMUSCULAR | Status: DC | PRN
  Administered 2023-06-14 – 2023-06-15 (×2): 2 mg via INTRAVENOUS
  Filled 2023-06-14 (×2): qty 2

## 2023-06-14 MED ORDER — HYDROMORPHONE HCL 2 MG PO TABS
2.0000 mg | ORAL_TABLET | Freq: Four times a day (QID) | ORAL | Status: DC | PRN
  Administered 2023-06-15: 2 mg via ORAL
  Filled 2023-06-14: qty 1

## 2023-06-14 NOTE — Progress Notes (Addendum)
 [facility] Progress Note   Subjective:   Seen in room, laying sideways in bed. Denies CP/dyspnea, but not moving for exam.S/p HD yesterday - only 1L net UF.  Objective Vitals:   06/13/23 1740 06/13/23 2003 06/14/23 0511 06/14/23 0800  BP: (!) 120/54 (!) 111/58 (!) 117/90 107/78  Pulse: 98 97  93  Resp: 18 18 18 17   Temp: 97.7 F (36.5 C) 98.5 F (36.9 C) 98.3 F (36.8 C) 98.3 F (36.8 C)  TempSrc:   Oral Oral  SpO2: 100% 100% 100% 100%  Weight:      Height:       Physical Exam General: Chronically ill appearing woman, NAD. Curled up. Heart: RRR Lungs: CTA Extremities: R BKA, LLE bandaged with 3+ edema Dialysis Access: TDC in R chest, LUE AVF  Additional Objective Labs: Basic Metabolic Panel: Recent Labs  Lab 06/10/23 0246 06/12/23 1401 06/13/23 0459  NA 137 133* 136  K 3.5 4.6 4.7  CL 100 99 101  CO2 26 19* 19*  GLUCOSE 117* 228* 102*  BUN 33* 62* 66*  CREATININE 5.20* 7.86* 8.19*  CALCIUM 8.1* 8.4* 8.8*  PHOS 6.2* 8.0* 8.2*   Liver Function Tests: Recent Labs  Lab 06/10/23 0246 06/12/23 1401 06/13/23 0459  ALBUMIN 1.9* 2.1* 2.3*   CBC: Recent Labs  Lab 06/08/23 0248 06/10/23 2136  WBC 9.8 12.2*  HGB 8.5* 8.4*  HCT 28.8* 27.7*  MCV 99.0 99.6  PLT 256 320   Medications:   (feeding supplement) PROSource Plus  30 mL Oral BID BM   acidophilus  1 capsule Oral TID WC   allopurinol  100 mg Oral BID   atorvastatin  10 mg Oral QPM   busPIRone  5 mg Oral TID   cholestyramine light  4 g Oral BID   darbepoetin (ARANESP) injection - DIALYSIS  200 mcg Subcutaneous Q Wed-1800   diclofenac Sodium  2 g Topical QID   DULoxetine  20 mg Oral Daily   famotidine  20 mg Oral QAC breakfast   ferric citrate  420 mg Oral TID WC   folic acid  1 mg Oral Daily   Gerhardt's butt cream   Topical BID   heparin  5,000 Units Subcutaneous Q8H   hyoscyamine  0.125 mg Sublingual TID   insulin aspart  0-6 Units Subcutaneous TID WC   loperamide  2 mg Oral  Q6H   methocarbamol  500 mg Oral TID   metoprolol tartrate  12.5 mg Oral BID   midodrine  10 mg Oral Q T,Th,Sa-HD   mirtazapine  15 mg Oral QHS   pantoprazole  40 mg Oral BID   promethazine  25 mg Oral TID AC & HS   risperiDONE  1 mg Oral QHS   traZODone  50 mg Oral QHS    Dialysis Orders TTS - [facility] 4hr, 400/800, EDW 101.5kg, 3K/2.5Ca bath, TDC, no heparin - Mircera IV q 2 weeks - last given 3/15 - Hectoral IV q HD   Assessment/Plan: Gastroparesis + chronic diarrhea: Improving. Recent C.diff - negative here. ESRD: Usual TTS schedule - has refused HD at least 2X, so off schedule this admit. S/p HD 4/7 -> next 4/9. Vascular access: Refusing AVF use. VVS consulted - ok'd for use as of 4/4. TDC in place. L heel wound: Wound care following. HypoTN/volume: BP chronically soft. Uses midodrine pre-HD. Remains way above her prior EDW, reminded fluid restrictions. Will try to give IV alb q HD to assist with UF.  Anemia of ESRD: Hgb 8.4 - continue Aranesp q Wed while here. Secondary HPTH: Ca ok, Phos high - continue Auryxia as binder, ^ dose. Nutrition: Albumin low, continue protein supps. T2DM A-fib: Not on AC d/t recent GIB Chronic pain syndrome Mood disorder/depression: Appreciate psych consult 4/7, was started on duloxetine.   Ozzie Hoyle, PA-C 06/14/2023, 9:32 AM  [employer]
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
## 2023-06-14 NOTE — Progress Notes (Signed)
 Progress Note   Patient: Deborah [name] ZOX:[id number] DOB: October 06, 1965 DOA: 05/29/2023     15 DOS: the patient was seen and examined on 06/14/2023   Brief hospital course: Mrs. Laroque was admitted to the hospital with the working diagnosis of acute on chronic gastroparesis.   58 yrs old female with ESRD on HD TTS, gastroparesis, GERD, PAF not on AC, diastolic CHF with preserved EF 55 to 60%, gout, duodenal carcinoid tumor, PVD s/p right-sided BKA, history of gastric AVM, NASH, depression, anxiety, obesity, restrictive lung disease, spinal stenosis, chronic pain syndrome,T2DM, chronic left heel diabetic ulcer, and history of chronic diarrhea in the setting of carcinoid tumor who presented to ED with complaints of diarrhea in setting of C.Diff and abdominal pain.  Patient has missed hemodialysis because of having intractable nausea and vomiting.  Patient has been taking oral vancomycin for C. Difficile+.   On her initial physical examination her blood pressure was 131/71, HR 89, RR 16 and 02 saturation 100%. Lungs with no wheezing or rales, no rhonchi, heart with S1 and S2 present and regular with no gallops, rubs or murmurs, abdomen protuberant, with generalized tenderness to palpation, with no rebound or guarding, positive lower extremity edema, right BKA.   CT A&P no acute findings.    Patient is admitted for fluid overload due to missed hemodialysis due to diarrhea and intractable nausea and vomiting.   04/04 decrease frequency of IV hydromorphone. Pending placement in SNF\ 04/07 consulted psychiatry   04/08 plan to transition to po hydromorphone tomorrow afternoon.   Assessment and Plan: * Gastroparesis Gastrointestinal symptoms are improving, patient is tolerating po.  Continue with pre meal phenergan po .   Plan to continue supportive medical therapy with as needed antiemetics.  Scheduled antiacids.   ESRD on dialysis (HCC) Hyponatremia. Hyperphosphatemia.   She has  peripheral edema.  She has been not compliant with sodium restricted diet.  Continue inpatient renal replacement therapy per nephrology recommendations.  She has left upper extremity fistula with appropriate functioning.   Scheduled for HD per T-TH-SAT, schedule    Anemia of chronic renal disease. Continue with EPO.  Acute on chronic diastolic CHF (congestive heart failure) (HCC) Volume overload in the setting of ESRD. Patient not compliant with HD as outpatient.  Plan to continue ultrafiltration on HD.   Continue blood pressure support with midodrine. Continue metoprolol.    Paroxysmal atrial fibrillation (HCC) Continue metoprolol for rate control.  Patient is off anticoagulation with to  history of GI bleeding in 10/24   Off telemetry   Chronic pain syndrome/chronic abdominal pain Left shoulder pain has improved with topical diclofenac, continue with muscle relaxants and analgesics.   Encourage mobility.  Continue IV hydromorphone to every 5 hrs as needed, will plan to transition to po tomorrow afternoon.  Continue with hydroxyzine and scheduled methocarbamol tid.    Patient having depression and anxiety symptoms that likely are exacerbating her pain. Continue with trazodone for insomnia.  Follow up with psychiatry recommendations.   Insulin dependent type 2 diabetes mellitus (HCC) Continue glucose cover and monitoring with insulin sliding scale  Patient is having better toleration to po.   Chronic diarrhea with history of C.Diff C diff has resolved.   Malignant carcinoid tumor of duodenum [facility]) S/p excision  Patient has intermittent diarrhea, continue lomotil as needed.   GERD (gastroesophageal reflux disease) Continue antiacid therapy. As needed antiemetics.   Chronic depression/anxiety Continue remeron, buspar and hydroxyzine  Chronic pain syndrome.  Patient with uncontrolled disease,  follow up with psychiatry recommendations.   Chronic gout -Continue  allopurinol 100 mg twice daily.   Chronic wound of left heel Wound consult   Obesity, class 2 Calculated BMI 38.7         Subjective: Patient with continue with generalized pain, has not been able to sleep, she is tolerating po.   Physical Exam: Vitals:   06/13/23 1740 06/13/23 2003 06/14/23 0511 06/14/23 0800  BP: (!) 120/54 (!) 111/58 (!) 117/90 107/78  Pulse: 98 97  93  Resp: 18 18 18 17   Temp: 97.7 F (36.5 C) 98.5 F (36.9 C) 98.3 F (36.8 C) 98.3 F (36.8 C)  TempSrc:   Oral Oral  SpO2: 100% 100% 100% 100%  Weight:      Height:       Neurology awake and alert ENT with no pallor Cardiovascular with S1 and S2 present and regular with no gallops rubs or murmurs Respiratory with no rales or wheezing Abdomen with no distention  Positive lower extremity edema +++ , right BKA +++  Data Reviewed:    Family Communication: no family at the bedside   Disposition: Status is: Inpatient Remains inpatient appropriate because: pending transfer to SNF   Planned Discharge Destination: Skilled nursing facility     Author: Coralie Keens, MD 06/14/2023 12:44 PM  For on call review [email or web address].

## 2023-06-14 NOTE — Progress Notes (Deleted)
 06/14/2023 Deborah [name] [id number] February 01, 1966  Referring provider: No ref. provider found Primary GI doctor: {[id number]} previously digestive health  ASSESSMENT AND PLAN:   Assessment and Plan          End-stage renal disease  on dialysis Tuesday Thursday Saturday  Atrial fibrillation Not on anticoagulation secondary to history of GI bleed  History of duodenal carcinoma Status post resection  History of gastric AVMs  History of C. Difficile  History of gastroparesis with GERD History of TD with Reglan, intolerant to erythromycin EGD February 2025 inpatient unremarkable normal esophagus mild gastritis mild duodenitis retained food  Diastolic heart failure  Mash  Type 2 diabetes with chronic left heel diabetic ulcer  Patient Care Team: Pcp, No as PCP - General Quintella Reichert, MD as PCP - Cardiology (Cardiology) Aldean Baker, MD (Family Medicine) Johney Maine, MD as Consulting Physician (Hematology)  HISTORY OF PRESENT ILLNESS: 58 y.o. female with a past medical history listed below presents as a new patient for hospital follow.     Discussed the use of AI scribe software for clinical note transcription with the patient, who gave verbal consent to proceed.  History of Present Illness            She  reports that she has never smoked. She has never used smokeless tobacco. She reports that she does not drink alcohol and does not use drugs.  RELEVANT GI HISTORY, IMAGING AND LABS: Results         EGD 04/28/23 showed a normal esophagus that was dilated and biopsied.  She had retained gastric fluid and gastritis as well as nodular back have site in the duodenal bulb that was biopsied.   A. DUODENUM, BIOPSY:       Active duodenitis with foveolar metaplasia and Brunner gland  hyperplasia consistent with chronic active duodenitis.       Negative for dysplasia or malignancy.   B. STOMACH, BIOPSY:       Gastric antral / oxyntic mucosa  with chronic inactive gastritis.       Marked reactive epithelial changes with granulation tissue type  stroma suggestive of healed ulcer.       No H. pylori identified on HE stain.       Negative for intestinal metaplasia or dysplasia.   C. ESOPHAGUS, BIOPSY:       Esophageal squamous mucosa without significant diagnostic  alteration.      Negative for intraepithelial lymphocytes or eosinophils.    EGD 12/23/2022 as an inpatient by Dr. Barron Alvine:  - Normal upper third of esophagus and middle third of esophagus.  - LA Grade A esophagitis with no bleeding.  - Three bleeding angioectasias in the stomach. Treated with argon plasma coagulation (APC).  - Gastritis. Treated with argon plasma coagulation (APC).  - Normal examined duodenum.  - Successful completion of the Video Capsule Enteroscope placement.  - No specimens collected.  - Colonoscopy not performed today due to solid stools.    GIVENS SMALL BOWL CAPSULE ENDOSCOPY 12/23/2022:   EGD 07/26/2020:  - LA Grade A esophagitis with no bleeding. Suspected cause of black emesis.  - A large amount of food (residue) in the stomach.  - Retained food in the duodenum.   A. DUODENUM, POLYPECTOMY:  - Low grade neuroendocrine tumor (carcinoid tumor), broadly present at  multiple tissue edges.  - Lymphovascular invasion is identified.  - See comment.    EGD/EUS at [facility] in August 2021 where they  did biopsies, but they terminated the procedure due to difficulty breathing.  They had her return a week later on 11/06/2019 for repeat procedure with general anesthesia.  Records are in Care Everywhere.     EGD 08/02/2019:  - Z-line regular, 36 cm from the incisors.  - Normal mucosa was found in the entire stomach.  - Non-bleeding duodenal ulcer with no stigmata of bleeding.  - A single duodenal polyp. Resected and retrieved. Clip was placed.  - Normal second portion of the duodenum.   Colonoscopy 07/30/2019:  - Preparation  of the colon was fair.  - The examined portion of the ileum was normal.  - Two 2 to 3 mm polyps in the descending colon. Biopsied.  - One 10 mm polyp in the proximal sigmoid colon, removed with a hot snare. Resected and retrieved.  - Internal hemorrhoids            Path report consistent with tubular adenomas             CBC    Component Value Date/Time   WBC 12.2 (H) 06/10/2023 2136   RBC 2.78 (L) 06/10/2023 2136   HGB 8.4 (L) 06/10/2023 2136   HGB 8.8 (L) 10/09/2019 0906   HGB 9.6 (L) 09/03/2019 1620   HCT 27.7 (L) 06/10/2023 2136   HCT 30.4 (L) 09/03/2019 1620   PLT 320 06/10/2023 2136   PLT 348 10/09/2019 0906   PLT 451 (H) 09/03/2019 1620   MCV 99.6 06/10/2023 2136   MCV 85 09/03/2019 1620   MCH 30.2 06/10/2023 2136   MCHC 30.3 06/10/2023 2136   RDW 14.8 06/10/2023 2136   RDW 13.6 09/03/2019 1620   LYMPHSABS 2.6 05/29/2023 0301   LYMPHSABS 2.2 09/03/2019 1620   MONOABS 0.6 05/29/2023 0301   EOSABS 0.3 05/29/2023 0301   EOSABS 0.3 09/03/2019 1620   BASOSABS 0.1 05/29/2023 0301   BASOSABS 0.1 09/03/2019 1620   Recent Labs    05/31/23 1154 06/02/23 0710 06/03/23 0243 06/04/23 0252 06/04/23 1419 06/05/23 1220 06/06/23 1415 06/07/23 0343 06/08/23 0248 06/10/23 2136  HGB 8.3* 8.6* 8.2* 9.3* 8.2* 7.8* 8.7* 8.4* 8.5* 8.4*    CMP     Component Value Date/Time   NA 136 06/13/2023 0459   NA 137 09/20/2019 1530   K 4.7 06/13/2023 0459   CL 101 06/13/2023 0459   CO2 19 (L) 06/13/2023 0459   GLUCOSE 102 (H) 06/13/2023 0459   BUN 66 (H) 06/13/2023 0459   BUN 18 09/20/2019 1530   CREATININE 8.19 (H) 06/13/2023 0459   CALCIUM 8.8 (L) 06/13/2023 0459   PROT 6.5 05/29/2023 0301   ALBUMIN 2.3 (L) 06/13/2023 0459   AST 14 (L) 05/29/2023 0301   ALT 11 05/29/2023 0301   ALKPHOS 101 05/29/2023 0301   BILITOT 0.9 05/29/2023 0301   GFRNONAA 5 (L) 06/13/2023 0459   GFRAA 49 (L) 11/30/2019 0356      Latest Ref Rng & Units 06/13/2023    4:59 AM 06/12/2023    2:01 PM  06/10/2023    2:46 AM  Hepatic Function  Albumin 3.5 - 5.0 g/dL 2.3  2.1  1.9       Current Medications:     Facility-Administered Medications Ordered in Other Visits (Endocrine & Metabolic):    insulin aspart (novoLOG) injection 0-6 Units    Facility-Administered Medications Ordered in Other Visits (Cardiovascular):    atorvastatin (LIPITOR) tablet 10 mg   cholestyramine light (PREVALITE) packet 4 g   metoprolol tartrate (LOPRESSOR)  tablet 12.5 mg   midodrine (PROAMATINE) tablet 10 mg   nitroGLYCERIN (NITROSTAT) SL tablet 0.4 mg    Facility-Administered Medications Ordered in Other Visits (Respiratory):    promethazine (PHENERGAN) tablet 25 mg    Facility-Administered Medications Ordered in Other Visits (Analgesics):    allopurinol (ZYLOPRIM) tablet 100 mg   HYDROmorphone (DILAUDID) injection 1-2 mg    Facility-Administered Medications Ordered in Other Visits (Hematological):    Darbepoetin Alfa (ARANESP) injection 200 mcg   folic acid (FOLVITE) tablet 1 mg   heparin injection 5,000 Units    Facility-Administered Medications Ordered in Other Visits (Other):    (feeding supplement) PROSource Plus liquid 30 mL   acidophilus (RISAQUAD) capsule 1 capsule   busPIRone (BUSPAR) tablet 5 mg   calcium carbonate (TUMS - dosed in mg elemental calcium) chewable tablet 200 mg of elemental calcium   diclofenac Sodium (VOLTAREN) 1 % topical gel 2 g   DULoxetine (CYMBALTA) DR capsule 20 mg   famotidine (PEPCID) tablet 20 mg   ferric citrate (AURYXIA) tablet 420 mg   Gerhardt's butt cream   hydrOXYzine (ATARAX) tablet 25 mg   hyoscyamine (LEVSIN SL) SL tablet 0.125 mg   loperamide (IMODIUM) capsule 2 mg   methocarbamol (ROBAXIN) tablet 500 mg   mirtazapine (REMERON) tablet 15 mg   ondansetron (ZOFRAN) injection 4 mg   Oral care mouth rinse   pantoprazole (PROTONIX) EC tablet 40 mg   risperiDONE (RISPERDAL M-TABS) disintegrating tablet 1 mg   traZODone (DESYREL) tablet 50  mg No current facility-administered medications for this visit. No current outpatient medications on file.  Medical History:  Past Medical History:  Diagnosis Date   Acute back pain with sciatica, left    Acute back pain with sciatica, right    Acute encephalopathy 05/29/2022   Acute osteomyelitis of right calcaneus (HCC) 12/21/2022   Anemia, unspecified    Atrial fibrillation with RVR (HCC)-resolved 09/09/2022   Atypical chest pain 09/10/2021   Cancer (HCC)    Carcinoid tumor of duodenum    Chest pain with normal coronary angiography 2019   Chronic a-fib (HCC) 09/09/2022   Chronic pain    Chronic systolic CHF (congestive heart failure) (HCC)    Dehiscence of amputation stump of right lower extremity (HCC) 01/29/2023   Diabetes mellitus    DKA (diabetic ketoacidosis) (HCC)    Drug-seeking behavior    21 hospitalizations and 14 CT a/p in 2 years for N/V and abdominal pain, demanding only IV dilaudid   Elevated troponin    chronic   Esophageal reflux    Fibromyalgia    Gastric ulcer    Gastroparesis    Gout    HCAP (healthcare-associated pneumonia) 06/19/2022   Hyperlipidemia    Hyperosmolar hyperglycemic state (HHS) (HCC) 05/11/2022   Hyperosmolar non-ketotic state due to type 2 diabetes mellitus (HCC) 05/11/2022   Hypertension    Hypomagnesemia    Lumbosacral stenosis    LVH (left ventricular hypertrophy)    Morbid obesity (HCC)    Nausea & vomiting 09/09/2022   NICM (nonischemic cardiomyopathy) (HCC)    PAF (paroxysmal atrial fibrillation) (HCC)    Sepsis (HCC) 11/23/2022   Stroke (HCC) 02/2011   Symptomatic anemia 12/14/2022   Thrombocytosis    Vitamin B12 deficiency anemia    Allergies:  Allergies  Allergen Reactions   Gabapentin Hives and [name]   Isovue [Iopamidol] Anaphylaxis, [name] and Other (See Comments)    11/28/17 Patient had seizure like activity and then 1 min  code after 100 cc of isovue 300. Possible contrast allergy vs  vasovagal episode  Cardiac Arrest   Nsaids Anaphylaxis and Other (See Comments)    Hx of stomach ulcers   Penicillins Itching, Palpitations and Other (See Comments)    Flushing (Red Skin) Laryngeal Edema   Reglan [Metoclopramide] Other (See Comments)    Tardive dyskinesia    Valium [Diazepam] [name]   Zestril [Lisinopril] Anaphylaxis and Swelling    Tongue and mouth swelling Laryngeal Edema   Tolectin [Tolmetin] Nausea And Vomiting, Nausea Only and Other (See Comments)    Irritates stomach ulcer   Asa [Aspirin] Other (See Comments)    Hx of stomach ulcer   Aspartame And Phenylalanine Hives   Bentyl [Dicyclomine] Other (See Comments)    Chest pain   Hibiclens [Chlorhexidine Gluconate] Other (See Comments)    Dermatitis    Flexeril [Cyclobenzaprine] Palpitations   Oxycontin [Oxycodone] Palpitations   Rifamycins Palpitations   Tylenol [Acetaminophen] Nausea And Vomiting, Nausea Only and Other (See Comments)    Irritates stomach ulcer Abdominal pain   Ultram [Tramadol] Nausea And Vomiting and Palpitations     Surgical History:  She  has a past surgical history that includes Cataract extraction (01/2014); Cholecystectomy; Esophagogastroduodenoscopy (N/A, 07/27/2019); biopsy (07/27/2019); Colonoscopy with propofol (N/A, 07/30/2019); biopsy (07/30/2019); polypectomy (07/30/2019); Esophagogastroduodenoscopy (egd) with propofol (N/A, 08/02/2019); polypectomy (08/02/2019); Hemostasis clip placement (08/02/2019); Esophagogastroduodenoscopy (N/A, 07/26/2020); IR US Guide Vasc Access Right (06/24/2022); IR Fluoro Guide CV Line Right (06/24/2022); AV fistula placement (Left, 06/30/2022); ABDOMINAL AORTOGRAM W/LOWER EXTREMITY (N/A, 11/29/2022); Esophagogastroduodenoscopy (egd) with propofol (N/A, 12/23/2022); Hot hemostasis (N/A, 12/23/2022); Givens capsule study (N/A, 12/23/2022); Amputation (Right, 12/29/2022); Fistula superficialization (Left, 12/31/2022); Stump revision (Right, 02/02/2023);  Esophagogastroduodenoscopy (egd) with propofol (N/A, 04/28/2023); Savory dilation (N/A, 04/28/2023); and biopsy (04/28/2023). Family History:  Her family history includes Congestive Heart Failure (age of onset: 93) in her sister; Diabetes in her brother, father, mother, and sister; Heart disease in her father.  REVIEW OF SYSTEMS  : All other systems reviewed and negative except where noted in the History of Present Illness.  PHYSICAL EXAM: LMP 10/10/2012  Physical Exam          Doree Albee, PA-C 8:08 AM

## 2023-06-14 NOTE — Plan of Care (Signed)
   Problem: Coping: Goal: Ability to adjust to condition or change in health will improve Outcome: Progressing

## 2023-06-14 NOTE — Progress Notes (Signed)
 Physical Therapy Treatment Patient Details Name: ROSHAWN LACINA MRN: [id number] DOB: Oct 26, 1965 Today's Date: 06/14/2023   History of Present Illness 58 y/o female presents to [facility] on 05/29/23 with resports of chest pain, abdominal pain, and diahrrea in setting of C-diff. Missed 2 dialysis sessions, fluid overloaded.  PMHx: ESRD on dialysis, gastroparesis, GERD, anemia of chronic disease, PAF, diastolic heart failure with preserved EF 55 to 60%, gout, duodenal carcinoid tumor, PVD s/p R BKA, history of gastric AVM, NASH, reactive dyskinesia, depression, anxiety, obesity, restrictive lung disease, spinal stenosis, chronic pain syndrome, insulin-dependent DM type II, chronic left heel diabetic ulcer, and history of chronic diarrhea 2/2 carcinoid tumor.    PT Comments  Pt sitting EOB and intermittently falling asleep with muscle spasms causing her to jump. Discussed the need for LUE elevation and how she should not sit all day with LLE dependent but pt reports she will have a panic attack if she lays back in the bed. Performed LE there ex with pt but pt needed max cueing due to cognition. Deferred transfer to chair due to lethargy. Patient will benefit from continued inpatient follow up therapy, <3 hours/day. PT will continue to follow.     If plan is discharge home, recommend the following: A lot of help with walking and/or transfers;A lot of help with bathing/dressing/bathroom;Assistance with cooking/housework;Direct supervision/assist for medications management;Direct supervision/assist for financial management;Assist for transportation;Help with stairs or ramp for entrance   Can travel by private vehicle     No  Equipment Recommendations  None recommended by PT    Recommendations for Other Services       Precautions / Restrictions Precautions Precautions: Fall Recall of Precautions/Restrictions: Impaired Precaution/Restrictions Comments: R BKA Restrictions Weight Bearing Restrictions  Per Provider Order: No     Mobility  Bed Mobility Overal bed mobility: Needs Assistance Bed Mobility: Supine to Sit, Sit to Supine           General bed mobility comments: pt sitting EOB with LLE down and seems to have been this way awhile, she is intermittently falling asleep as well as having muscle spasms as she falls asleep. Encouraged multiple times to lie down at end of session but pt reports that will make her have a panic attack. At one point pt lowered herself straight back onto bed horizontally and then returned to sitting upright. Pillow put against siderail to protect pt's head.    Transfers                   General transfer comment: deferred due to pt being so lethargic    Ambulation/Gait               General Gait Details: non ambulatory   Stairs             Wheelchair Mobility     Tilt Bed    Modified Rankin (Stroke Patients Only)       Balance Overall balance assessment: Needs assistance Sitting-balance support: No upper extremity supported, Feet supported Sitting balance-Leahy Scale: Good Sitting balance - Comments: worked on reaching in sitting but pt very limited by B shoulder pain Postural control: Posterior lean (Intermittent)                                  Communication Communication Communication: Other (comment) (slurred speech) Factors Affecting Communication: Reduced clarity of speech  Cognition Arousal: Lethargic Behavior During Therapy: Flat affect  PT - Cognitive impairments: No family/caregiver present to determine baseline                       PT - Cognition Comments: very lethargic during session and with nonsensical conversation at times, calling out for "April" periodically, likely due to pain meds Following commands: Impaired Following commands impaired: Follows one step commands with increased time, Follows one step commands inconsistently    Cueing Cueing Techniques: Verbal  cues  Exercises General Exercises - Lower Extremity Ankle Circles/Pumps: AROM, Left, 15 reps, Seated Long Arc Quad: Seated, Left, 10 reps Long Arc Quad Limitations: Pt unable to achieve full knee extension Amputee Exercises Hip ABduction/ADduction: AROM, Right, 10 reps, Seated Hip Flexion/Marching: AROM, Right, 10 reps, Seated Knee Flexion: AROM, Right, 10 reps, Seated Knee Extension: AROM, Right, 10 reps, Seated Other Exercises Other Exercises: shoulder shrugs x10 Other Exercises: Seated shoulder rolls B x8 reps.    General Comments General comments (skin integrity, edema, etc.): educated on need to keep LLE elevated but pt deferred at this time      Pertinent Vitals/Pain Pain Assessment Pain Assessment: Faces Faces Pain Scale: Hurts a little bit Pain Location: LLE when awake Pain Descriptors / Indicators: Aching, Discomfort, Throbbing, Grimacing Pain Intervention(s): Monitored during session    Home Living                          Prior Function            PT Goals (current goals can now be found in the care plan section) Acute Rehab PT Goals Patient Stated Goal: Decrease pain and go to rehab PT Goal Formulation: With patient Time For Goal Achievement: 06/15/23 Potential to Achieve Goals: Fair Progress towards PT goals: Not progressing toward goals - comment (lethargy)    Frequency    Min 1X/week      PT Plan      Co-evaluation              AM-PAC PT "6 Clicks" Mobility   Outcome Measure  Help needed turning from your back to your side while in a flat bed without using bedrails?: A Lot Help needed moving from lying on your back to sitting on the side of a flat bed without using bedrails?: A Lot Help needed moving to and from a bed to a chair (including a wheelchair)?: Total Help needed standing up from a chair using your arms (e.g., wheelchair or bedside chair)?: Total Help needed to walk in hospital room?: Total Help needed climbing 3-5  steps with a railing? : Total 6 Click Score: 8    End of Session   Activity Tolerance: Patient limited by lethargy Patient left: in bed;with call bell/phone within reach;with bed alarm set (sitting EOB) Nurse Communication: Mobility status;Other (comment) (pt position) PT Visit Diagnosis: Muscle weakness (generalized) (M62.81);Pain Pain - Right/Left: Left Pain - part of body: Leg;Ankle and joints of foot;Knee     Time: 0981-1914 PT Time Calculation (min) (ACUTE ONLY): 18 min  Charges:    $Therapeutic Exercise: 8-22 mins PT General Charges $$ ACUTE PT VISIT: 1 Visit                     Lyanne Co, PT  Acute Rehab Services Secure chat preferred Office [phone]    Lawana Chambers Farhana Fellows 06/14/2023, 1:54 PM

## 2023-06-14 NOTE — Plan of Care (Signed)
  Problem: Education: Goal: Individualized Educational Video(s) Outcome: Completed/Met

## 2023-06-14 NOTE — Progress Notes (Signed)
 Spoke to [employer] with [facility] out-pt HD this morning. Financial clearance for pt is still pending. Will assist as needed.   Olivia Canter Renal Navigator [phone]

## 2023-06-15 ENCOUNTER — Ambulatory Visit: Admitting: Physician Assistant

## 2023-06-15 DIAGNOSIS — K3184 Gastroparesis: Secondary | ICD-10-CM | POA: Diagnosis not present

## 2023-06-15 LAB — CBC
HCT: 25.5 % — ABNORMAL LOW (ref 36.0–46.0)
Hemoglobin: 7.6 g/dL — ABNORMAL LOW (ref 12.0–15.0)
MCH: 29.3 pg (ref 26.0–34.0)
MCHC: 29.8 g/dL — ABNORMAL LOW (ref 30.0–36.0)
MCV: 98.5 fL (ref 80.0–100.0)
Platelets: 354 10*3/uL (ref 150–400)
RBC: 2.59 MIL/uL — ABNORMAL LOW (ref 3.87–5.11)
RDW: 15.1 % (ref 11.5–15.5)
WBC: 9.4 10*3/uL (ref 4.0–10.5)
nRBC: 0 % (ref 0.0–0.2)

## 2023-06-15 LAB — RENAL FUNCTION PANEL
Albumin: 2 g/dL — ABNORMAL LOW (ref 3.5–5.0)
Anion gap: 15 (ref 5–15)
BUN: 49 mg/dL — ABNORMAL HIGH (ref 6–20)
CO2: 22 mmol/L (ref 22–32)
Calcium: 8.1 mg/dL — ABNORMAL LOW (ref 8.9–10.3)
Chloride: 99 mmol/L (ref 98–111)
Creatinine, Ser: 7.74 mg/dL — ABNORMAL HIGH (ref 0.44–1.00)
GFR, Estimated: 6 mL/min — ABNORMAL LOW (ref >60)
Glucose, Bld: 106 mg/dL — ABNORMAL HIGH (ref 70–99)
Phosphorus: 7.3 mg/dL — ABNORMAL HIGH (ref 2.5–4.6)
Potassium: 4.1 mmol/L (ref 3.5–5.1)
Sodium: 136 mmol/L (ref 135–145)

## 2023-06-15 LAB — GLUCOSE, CAPILLARY
Glucose-Capillary: 71 mg/dL (ref 70–99)
Glucose-Capillary: 115 mg/dL — ABNORMAL HIGH (ref 70–99)
Glucose-Capillary: 86 mg/dL (ref 70–99)
Glucose-Capillary: 98 mg/dL (ref 70–99)

## 2023-06-15 MED ORDER — HYDROMORPHONE HCL 2 MG PO TABS
2.0000 mg | ORAL_TABLET | Freq: Four times a day (QID) | ORAL | Status: DC | PRN
  Administered 2023-06-15 – 2023-06-16 (×2): 2 mg via ORAL
  Filled 2023-06-15 (×3): qty 1

## 2023-06-15 MED ORDER — HYDROMORPHONE HCL 1 MG/ML IJ SOLN
2.0000 mg | Freq: Once | INTRAMUSCULAR | Status: DC
  Filled 2023-06-15 (×2): qty 2

## 2023-06-15 MED ORDER — PENTAFLUOROPROP-TETRAFLUOROETH EX AERO: 1.0000 | INHALATION_SPRAY | CUTANEOUS | Status: DC | PRN

## 2023-06-15 MED ORDER — HEPARIN SODIUM (PORCINE) 1000 UNIT/ML DIALYSIS: 1000.0000 [IU] | INTRAMUSCULAR | Status: DC | PRN

## 2023-06-15 MED ORDER — ANTICOAGULANT SODIUM CITRATE 4% (200MG/5ML) IV SOLN: 5.0000 mL | Status: DC | PRN

## 2023-06-15 MED ORDER — ALBUMIN HUMAN 25 % IV SOLN
25.0000 g | Freq: Once | INTRAVENOUS | Status: AC
  Administered 2023-06-15: 25 g via INTRAVENOUS
  Filled 2023-06-15: qty 100

## 2023-06-15 MED ORDER — ALTEPLASE 2 MG IJ SOLR: 2.0000 mg | Freq: Once | INTRAMUSCULAR | Status: DC | PRN

## 2023-06-15 MED ORDER — HEPARIN SODIUM (PORCINE) 1000 UNIT/ML IJ SOLN
3800.0000 [IU] | Freq: Once | INTRAMUSCULAR | Status: AC
  Administered 2023-06-15: 3800 [IU]
  Filled 2023-06-15: qty 4

## 2023-06-15 MED ORDER — HYDROMORPHONE HCL 1 MG/ML IJ SOLN: 1.0000 mg | INTRAMUSCULAR | Status: AC | PRN

## 2023-06-15 MED ORDER — LIDOCAINE-PRILOCAINE 2.5-2.5 % EX CREA: 1.0000 | TOPICAL_CREAM | CUTANEOUS | Status: DC | PRN

## 2023-06-15 MED ORDER — LIDOCAINE HCL (PF) 1 % IJ SOLN: 5.0000 mL | INTRAMUSCULAR | Status: DC | PRN

## 2023-06-15 MED ORDER — DULOXETINE HCL 30 MG PO CPEP
30.0000 mg | ORAL_CAPSULE | Freq: Every day | ORAL | Status: DC
  Administered 2023-06-17 – 2023-06-20 (×4): 30 mg via ORAL
  Filled 2023-06-15 (×6): qty 1

## 2023-06-15 NOTE — Progress Notes (Addendum)
 Pt has been accepted at [facility] on TTS 5:40 am arrival for 6:00 am chair time. Pt can start tomorrow or next Tuesday but clinic cannot start pt on Saturday. This info provided to CSW to see if snf can accommodate days/time. Will assist as needed.   Olivia Canter Renal Navigator [phone]  Addendum at 11:48 am: Pt has been provided an alternative HD schedule. Pt will be MWF 6:45 am arrival for 7:05 am chair time at [facility]. Pt can start on Friday. This info was provided to CSW. SNF agreeable to this schedule per CSW. Information sheet provided to pt with clinic information and schedule noted. Pt voices understanding and agreeable.

## 2023-06-15 NOTE — Progress Notes (Signed)
 Deborah [name]  UJW:[id number] DOB: Apr 19, 1965 DOA: 05/29/2023 PCP: Pcp, No    Brief Narrative:  58 year old with a history of ESRD on HD TTS, chronic gastroparesis with GERD, PAF not on AC, diastolic CHF, gout, duodenal carcinoid tumor, PVD status post right BKA, gastric AVMs, NASH, depression/anxiety, obesity, restrictive lung disease, spinal stenosis with chronic pain syndrome, DM2, chronic left heel diabetic ulcer, and chronic diarrhea related to carcinoid tumor who presented to the ED with complaints of worsening diarrhea and abdominal pain in the setting of outpatient diagnosed C. difficile.  Goals of Care:   Code Status: Full Code   DVT prophylaxis: heparin injection 5,000 Units Start: 05/29/23 1400   Interim Hx: Appears to be resting comfortably in bed on the hemodialysis unit, though she tells me that she has poorly controlled abdominal pain.  There is no objective evidence of this at presentation.  Patient is pleasant and interactive and jovial.  She denies shortness of breath fevers chills or vomiting.  Assessment & Plan:  Chronic gastroparesis Symptoms improving -patient tolerating oral intake -continue supportive medical therapy and antiemetics  Chronic diarrhea with recent history of C. difficile C. difficile negative this admission -appears stable presently  Malignant carcinoid tumor of the duodenum Status post excision -continues to have intermittent diarrhea which is controlled with Lomotil  Chronic pain syndrome - chronic abdominal pain Pain medications have been titrated during this hospital stay -transition to oral hydromorphone today  Anxiety/depression Continue Remeron, BuSpar, and hydroxyzine - follow-up with psychiatry  ESRD on HD TTS HD per nephrology  Anemia of chronic kidney disease Erythropoietin per nephrology  Acute exacerbation on chronic diastolic CHF Patient has been noncompliant with renal diet and outpatient HD -exacerbation resolved  with inpatient HD management  Paroxysmal atrial fibrillation Continue metoprolol -not on anticoagulation due to history of GI bleeding 2024  DM 2 CBG well-controlled  Gout Continue allopurinol -no evidence of acute flare at this time  Chronic wound of left heel  Morbid obesity - Body mass index is 43.7 kg/m (pended).    Family Communication: No family present at time of exam Disposition: Anticipate discharge to SNF for rehab stay 4/10   Objective: Blood pressure (!) 113/57, pulse 99, temperature (!) 97.4 F (36.3 C), temperature source Oral, resp. rate 18, height (P) 5\' 6"  (1.676 m), weight 122.8 kg, last menstrual period 10/10/2012, SpO2 100%.  Intake/Output Summary (Last 24 hours) at 06/15/2023 1107 Last data filed at 06/15/2023 0600 Gross per 24 hour  Intake 300 ml  Output 0 ml  Net 300 ml   Filed Weights   06/08/23 0500 06/11/23 0756 06/13/23 1507  Weight: 108.9 kg 109.2 kg 122.8 kg    Examination: General: No acute respiratory distress Lungs: Clear to auscultation bilaterally without wheezes or crackles Cardiovascular: Regular rate and rhythm without murmur gallop or rub normal S1 and S2 Abdomen: Nontender, nondistended, soft, bowel sounds positive, no rebound, no ascites, no appreciable mass Extremities: No significant cyanosis, clubbing, or edema lower extremities  CBC: Recent Labs  Lab 06/10/23 2136  WBC 12.2*  HGB 8.4*  HCT 27.7*  MCV 99.6  PLT 320   Basic Metabolic Panel: Recent Labs  Lab 06/10/23 0246 06/12/23 1401 06/13/23 0459  NA 137 133* 136  K 3.5 4.6 4.7  CL 100 99 101  CO2 26 19* 19*  GLUCOSE 117* 228* 102*  BUN 33* 62* 66*  CREATININE 5.20* 7.86* 8.19*  CALCIUM 8.1* 8.4* 8.8*  PHOS 6.2* 8.0* 8.2*   GFR: Estimated  Creatinine Clearance: 9.9 mL/min (A) (by C-G formula based on SCr of 8.19 mg/dL (H)).   Scheduled Meds:  (feeding supplement) PROSource Plus  30 mL Oral BID BM   acidophilus  1 capsule Oral TID WC   allopurinol  100  mg Oral BID   atorvastatin  10 mg Oral QPM   busPIRone  5 mg Oral TID   cholestyramine light  4 g Oral BID   darbepoetin (ARANESP) injection - DIALYSIS  200 mcg Subcutaneous Q Wed-1800   diclofenac Sodium  2 g Topical QID   DULoxetine  20 mg Oral Daily   famotidine  20 mg Oral QAC breakfast   ferric citrate  420 mg Oral TID WC   folic acid  1 mg Oral Daily   Gerhardt's butt cream   Topical BID   heparin  5,000 Units Subcutaneous Q8H   hyoscyamine  0.125 mg Sublingual TID   insulin aspart  0-6 Units Subcutaneous TID WC   loperamide  2 mg Oral Q6H   methocarbamol  500 mg Oral TID   metoprolol tartrate  12.5 mg Oral BID   midodrine  10 mg Oral Q T,Th,Sa-HD   mirtazapine  15 mg Oral QHS   pantoprazole  40 mg Oral BID   promethazine  25 mg Oral TID AC & HS   risperiDONE  1 mg Oral QHS   traZODone  50 mg Oral QHS   Continuous Infusions:  anticoagulant sodium citrate       LOS: 16 days   Lonia Blood, MD [facility] Office  [phone] Pager - Text Page per Loretha Stapler  If 7PM-7AM, please contact night-coverage per Amion 06/15/2023, 11:07 AM

## 2023-06-15 NOTE — Progress Notes (Signed)
 [name] Progress Note   Subjective:  Seen in room - sitting up naked in bed. C/o chronic abdominal pain - tells me that she is being transitioned to PO pain meds and concerned. For HD today - she is agreeable. Denies CP/dyspnea.   Objective Vitals:   06/14/23 1656 06/14/23 2124 06/15/23 0350 06/15/23 0855  BP:  (!) 96/55 (!) 113/59 (!) 113/57  Pulse: 90 90 92 99  Resp: 18 20 20 18   Temp: 97.8 F (36.6 C) (!) 97.5 F (36.4 C) 98.2 F (36.8 C) (!) 97.4 F (36.3 C)  TempSrc:  Oral Oral Oral  SpO2: 99% 99% 100% 100%  Weight:      Height:       Physical Exam General: Chronically ill appearing woman, NAD. Room air Heart: RRR Lungs: CTAB, no rales Extremities: R BKA, LLE bandaged with 3+ edema Dialysis Access: TDC in R chest, LUE AVF  Additional Objective Labs: Basic Metabolic Panel: Recent Labs  Lab 06/10/23 0246 06/12/23 1401 06/13/23 0459  NA 137 133* 136  K 3.5 4.6 4.7  CL 100 99 101  CO2 26 19* 19*  GLUCOSE 117* 228* 102*  BUN 33* 62* 66*  CREATININE 5.20* 7.86* 8.19*  CALCIUM 8.1* 8.4* 8.8*  PHOS 6.2* 8.0* 8.2*   Liver Function Tests: Recent Labs  Lab 06/10/23 0246 06/12/23 1401 06/13/23 0459  ALBUMIN 1.9* 2.1* 2.3*   CBC: Recent Labs  Lab 06/10/23 2136  WBC 12.2*  HGB 8.4*  HCT 27.7*  MCV 99.6  PLT 320   Medications:   (feeding supplement) PROSource Plus  30 mL Oral BID BM   acidophilus  1 capsule Oral TID WC   allopurinol  100 mg Oral BID   atorvastatin  10 mg Oral QPM   busPIRone  5 mg Oral TID   cholestyramine light  4 g Oral BID   darbepoetin (ARANESP) injection - DIALYSIS  200 mcg Subcutaneous Q Wed-1800   diclofenac Sodium  2 g Topical QID   DULoxetine  20 mg Oral Daily   famotidine  20 mg Oral QAC breakfast   ferric citrate  420 mg Oral TID WC   folic acid  1 mg Oral Daily   Gerhardt's butt cream   Topical BID   heparin  5,000 Units Subcutaneous Q8H   hyoscyamine  0.125 mg Sublingual TID   insulin aspart  0-6  Units Subcutaneous TID WC   loperamide  2 mg Oral Q6H   methocarbamol  500 mg Oral TID   metoprolol tartrate  12.5 mg Oral BID   midodrine  10 mg Oral Q T,Th,Sa-HD   mirtazapine  15 mg Oral QHS   pantoprazole  40 mg Oral BID   promethazine  25 mg Oral TID AC & HS   risperiDONE  1 mg Oral QHS   traZODone  50 mg Oral QHS    Dialysis Orders TTS - [facility] 4hr, 400/800, EDW 101.5kg, 3K/2.5Ca bath, TDC, no heparin - Mircera IV q 2 weeks - last given 3/15 - Hectoral IV q HD   Assessment/Plan: Gastroparesis + chronic diarrhea: Improving. Recent C.diff - negative here. ESRD: Usual TTS schedule - has refused HD at least 2X, so off schedule this admit. For HD today, back to usual schedule once closer to discharge. Vascular access: Refusing AVF use. VVS consulted - ok'd for use as of 4/4. TDC in place. L heel wound: Wound care following. HypoTN/volume: BP chronically soft. Uses midodrine pre-HD. Remains way above her  prior EDW, reminded fluid restrictions. Will try to give IV alb q HD to assist with UF. Anemia of ESRD: Hgb 8.4 - continue Aranesp q Wed while here. Secondary HPTH: Ca ok, Phos high - continue Auryxia as binder at higher dose. Nutrition: Albumin low, continue protein supps. T2DM A-fib: Not on AC d/t recent GIB Chronic pain syndrome Mood disorder/depression: Appreciate psych consult 4/7, was started on duloxetine.   Ozzie Hoyle, PA-C 06/15/2023, 10:03 AM  [employer]

## 2023-06-15 NOTE — Progress Notes (Signed)
 Received patient in bed to unit.  Alert and oriented.  Informed consent signed and in chart.   TX duration:3 hours and 35 min: Patient wanted to come off early.  AMA paperwork signed  Patient tolerated well.  Transported back to the room  Alert, without acute distress.  Hand-off given to patient's nurse.   Access used: R internal jugular HD Cath Access issues: Had to change cartridge once with 90 minutes left on session due to clotting.  Total UF removed: 3.2L Medication(s) given: none   06/15/23 1756  Vitals  Temp 97.6 F (36.4 C)  Temp Source Oral  BP 112/62  MAP (mmHg) 76  Pulse Rate 92  ECG Heart Rate 92  Resp 13  Oxygen Therapy  SpO2 100 %  O2 Device Room Air  During Treatment Monitoring  Duration of HD Treatment -hour(s) 3.58 hour(s)  Cumulative Fluid Removed (mL) per Treatment  3216.56  HD Safety Checks Performed Yes  Intra-Hemodialysis Comments See progress note (Patient terminated session.  AMA paperwork signed.)  Dialysis Fluid Bolus Normal Saline  Bolus Amount (mL) 300 mL  Hemodialysis Catheter Right Subclavian Double lumen Permanent (Tunneled)  Placement Date/Time: 06/24/22 1644   Serial / Lot #: [id number]  Expiration Date: 09/08/26  Time Out: Correct patient;Correct site;Correct procedure  Maximum sterile barrier precautions: Hand hygiene;Mask;Cap;Sterile gloves;Sterile gown;Large sterile ...  Site Condition No complications  Blue Lumen Status Flushed;Heparin locked;Dead end cap in place  Red Lumen Status Flushed;Heparin locked;Dead end cap in place  Purple Lumen Status N/A  Catheter fill solution Heparin 1000 units/ml  Catheter fill volume (Arterial) 1.9 cc  Catheter fill volume (Venous) 1.9  Dressing Type Transparent  Dressing Status Antimicrobial disc/dressing in place;Clean, Dry, Intact  Drainage Description None  Dressing Change Due 06/18/23  Post treatment catheter status Capped and Clamped     Stacie Glaze LPN [facility]

## 2023-06-15 NOTE — Progress Notes (Signed)
 Mobility Specialist Progress Note:    06/15/23 1000  Mobility  Activity Turned to right side;Turned to left side;Turned to back - supine  Level of Assistance Standby assist, set-up cues, supervision of patient - no hands on  Assistive Device Other (Comment) (bed rail)  Range of Motion/Exercises All extremities;Active  Activity Response Tolerated well  Mobility Referral Yes (bed lvl exercises)  Mobility visit 1 Mobility  Mobility Specialist Start Time (ACUTE ONLY) [id number]  Mobility Specialist Stop Time (ACUTE ONLY) 0827  Mobility Specialist Time Calculation (min) (ACUTE ONLY) 4 min   Pt received in bed and agreeable. Performed ankle pumps, knee flexion/extension, and ab/adduction. Each exercise being 10 reps each. Pt then expressed need to have BM. Able to turn and MS place bedpan unde pt. No complaints throughout. Pt left in bed with call bell. RN notified.  D'Vante Earlene Plater Mobility Specialist Please contact via Special educational needs teacher or Rehab office at [phone]

## 2023-06-15 NOTE — TOC Progression Note (Signed)
 Transition of Care [facility]) - Progression Note    Patient Details  Name: Deborah [name] MRN: [id number] Date of Birth: 1965-11-14  Transition of Care [facility]) CM/SW Contact  Baldemar Lenis, [place] Phone Number: 06/15/2023, 11:51 AM  Clinical Narrative:   CSW following for disposition. CSW updated by Renal Navigator about outpatient HD chair time, and passed information onto [facility]. They can accommodate outpatient chair time and can admit patient pending insurance approval. CSW requested CMA to initiate insurance authorization, pending at this time. CSW to follow.    Expected Discharge Plan: Skilled Nursing Facility Barriers to Discharge: Continued Medical Work up, Other (must enter comment) (Awaiting PT/OT to see)  Expected Discharge Plan and Services In-house Referral: Clinical Social Work     Living arrangements for the past 2 months: Skilled Holiday representative                   DME Agency: NA                   Social Determinants of Health (SDOH) Interventions SDOH Screenings   Food Insecurity: No Food Insecurity (05/29/2023)  Housing: Patient Declined (05/29/2023)  Recent Concern: Housing - High Risk (04/25/2023)  Transportation Needs: Unmet Transportation Needs (05/29/2023)  Utilities: Not At Risk (05/29/2023)  Depression (PHQ2-9): Medium Risk (03/16/2023)  Financial Resource Strain: Low Risk (03/23/2021)   [facility] from [facility])  Physical Activity: Not on File (10/31/2017)   Received from [place], [place]  Social Connections: Unknown (07/19/2021)   Received from [facility]  Stress: Low Risk (03/23/2021)   [facility])  Tobacco Use: Low Risk  (05/29/2023)    Readmission Risk Interventions    03/07/2023    4:19 PM 11/25/2022    5:16 PM 07/05/2022    1:20 PM  Readmission Risk Prevention Plan  Transportation Screening Complete Complete Complete  Medication Review  (RN Care Manager) Complete Complete Referral to Pharmacy  PCP or Specialist appointment within 3-5 days of discharge Complete Complete Complete  HRI or Home Care Consult Complete Complete Complete  SW Recovery Care/Counseling Consult Complete Complete Complete  Palliative Care Screening Complete Not Applicable Not Applicable  Skilled Nursing Facility Not Applicable Not Applicable Not Applicable

## 2023-06-15 NOTE — Consult Note (Signed)
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 Palpitations   Oxycontin [Oxycodone] Palpitations   Rifamycins Palpitations   Tylenol [Acetaminophen] Nausea And Vomiting, Nausea Only and Other (See Comments)    Irritates stomach ulcer Abdominal pain   Ultram [Tramadol] Nausea And Vomiting and Palpitations    Sharlie Shreffler, NP

## 2023-06-15 NOTE — TOC Progression Note (Signed)
 Transition of Care [facility]) - Progression Note    Patient Details  Name: Deborah [name] MRN: [id number] Date of Birth: 04/01/1965  Transition of [facility]) CM/SW Contact  Mearl Latin, LCSW Phone Number: 06/15/2023, 8:47 AM  Clinical Narrative:    CSW received voicemail from [employer], Placement Social Worker with [facility] DSS [phone]) requesting placement update for patient. CSW returned call and left voicemail. CSW updated [facility].   Expected Discharge Plan: Skilled Nursing Facility Barriers to Discharge: Continued Medical Work up, Other (must enter comment) (Awaiting PT/OT to see)  Expected Discharge Plan and Services In-house Referral: Clinical Social Work     Living arrangements for the past 2 months: Skilled Holiday representative                   DME Agency: NA                   Social Determinants of Health (SDOH) Interventions SDOH Screenings   Food Insecurity: No Food Insecurity (05/29/2023)  Housing: Patient Declined (05/29/2023)  Recent Concern: Housing - High Risk (04/25/2023)  Transportation Needs: Unmet Transportation Needs (05/29/2023)  Utilities: Not At Risk (05/29/2023)  Depression (PHQ2-9): Medium Risk (03/16/2023)  Financial Resource Strain: Low Risk (03/23/2021)   Received from [facility])  Physical Activity: Not on File (10/31/2017)   Received from [place], [place]  Social Connections: Unknown (07/19/2021)   Received from [facility]  Stress: Low Risk (03/23/2021)   [facility])  Tobacco Use: Low Risk  (05/29/2023)    Readmission Risk Interventions    03/07/2023    4:19 PM 11/25/2022    5:16 PM 07/05/2022    1:20 PM  Readmission Risk Prevention Plan  Transportation Screening Complete Complete Complete  Medication Review (RN Care Manager) Complete Complete Referral to Pharmacy  PCP or Specialist appointment within 3-5 days of discharge  Complete Complete Complete  HRI or Home Care Consult Complete Complete Complete  SW Recovery Care/Counseling Consult Complete Complete Complete  Palliative Care Screening Complete Not Applicable Not Applicable  Skilled Nursing Facility Not Applicable Not Applicable Not Applicable

## 2023-06-15 NOTE — Plan of Care (Signed)
  Problem: Fluid Volume: Goal: Ability to maintain a balanced intake and output will improve Outcome: Progressing   Problem: Health Behavior/Discharge Planning: Goal: Ability to identify and utilize available resources and services will improve Outcome: Progressing Goal: Ability to manage health-related needs will improve Outcome: Progressing   Problem: Metabolic: Goal: Ability to maintain appropriate glucose levels will improve Outcome: Progressing   Problem: Nutritional: Goal: Maintenance of adequate nutrition will improve Outcome: Progressing Goal: Progress toward achieving an optimal weight will improve Outcome: Progressing   Problem: Skin Integrity: Goal: Risk for impaired skin integrity will decrease Outcome: Progressing   Problem: Tissue Perfusion: Goal: Adequacy of tissue perfusion will improve Outcome: Progressing   Problem: Education: Goal: Knowledge of General Education information will improve Description: Including pain rating scale, medication(s)/side effects and non-pharmacologic comfort measures Outcome: Progressing   Problem: Health Behavior/Discharge Planning: Goal: Ability to manage health-related needs will improve Outcome: Progressing   Problem: Clinical Measurements: Goal: Ability to maintain clinical measurements within normal limits will improve Outcome: Progressing Goal: Will remain free from infection Outcome: Progressing Goal: Diagnostic test results will improve Outcome: Progressing Goal: Respiratory complications will improve Outcome: Progressing Goal: Cardiovascular complication will be avoided Outcome: Progressing   Problem: Activity: Goal: Risk for activity intolerance will decrease Outcome: Progressing   Problem: Nutrition: Goal: Adequate nutrition will be maintained Outcome: Progressing   Problem: Coping: Goal: Level of anxiety will decrease Outcome: Progressing   Problem: Elimination: Goal: Will not experience  complications related to bowel motility Outcome: Progressing Goal: Will not experience complications related to urinary retention Outcome: Progressing   Problem: Pain Managment: Goal: General experience of comfort will improve and/or be controlled Outcome: Progressing   Problem: Safety: Goal: Ability to remain free from injury will improve Outcome: Progressing   Problem: Skin Integrity: Goal: Risk for impaired skin integrity will decrease Outcome: Progressing   Problem: Education: Goal: Knowledge of disease and its progression will improve Outcome: Progressing Goal: Individualized Educational Video(s) Outcome: Progressing   Problem: Fluid Volume: Goal: Compliance with measures to maintain balanced fluid volume will improve Outcome: Progressing   Problem: Health Behavior/Discharge Planning: Goal: Ability to manage health-related needs will improve Outcome: Progressing   Problem: Nutritional: Goal: Ability to make healthy dietary choices will improve Outcome: Progressing   Problem: Clinical Measurements: Goal: Complications related to the disease process, condition or treatment will be avoided or minimized Outcome: Progressing

## 2023-06-16 DIAGNOSIS — K3184 Gastroparesis: Secondary | ICD-10-CM | POA: Diagnosis not present

## 2023-06-16 LAB — GLUCOSE, CAPILLARY
Glucose-Capillary: 73 mg/dL (ref 70–99)
Glucose-Capillary: 77 mg/dL (ref 70–99)
Glucose-Capillary: 164 mg/dL — ABNORMAL HIGH (ref 70–99)

## 2023-06-16 MED ORDER — HYDROMORPHONE HCL 2 MG PO TABS
4.0000 mg | ORAL_TABLET | Freq: Once | ORAL | Status: AC
  Administered 2023-06-16: 4 mg via ORAL
  Filled 2023-06-16: qty 2

## 2023-06-16 MED ORDER — HYDROMORPHONE HCL 2 MG PO TABS
4.0000 mg | ORAL_TABLET | Freq: Four times a day (QID) | ORAL | Status: DC | PRN
  Administered 2023-06-16 – 2023-06-19 (×11): 4 mg via ORAL
  Filled 2023-06-16 (×11): qty 2

## 2023-06-16 NOTE — Progress Notes (Addendum)
 Contacted Kim with [facility] out-pt HD. Kim advised that pt will not start tomorrow but will likely d/c tomorrow and start at [facility] on Monday. Contacted inpt HD unit to request that pt receive inpt HD tomorrow 1st shift if possible so pt can d/c to snf in a timely manner. Will assist as needed.   Olivia Canter Renal Navigator [phone]

## 2023-06-16 NOTE — Progress Notes (Signed)
 Mobility Specialist Progress Note:   06/16/23 0800  Mobility  Activity Turned to right side;Turned to left side;Turned to back - supine  Level of Assistance Standby assist, set-up cues, supervision of patient - no hands on  Assistive Device None  Range of Motion/Exercises Active;All extremities  Activity Response Tolerated well  Mobility Referral Yes (bed lvl exercises)  Mobility visit 1 Mobility  Mobility Specialist Start Time (ACUTE ONLY) 0820  Mobility Specialist Stop Time (ACUTE ONLY) [id number]  Mobility Specialist Time Calculation (min) (ACUTE ONLY) 6 min   Pt received in bed, difficult to rouse but agreeable to mobility. Declined sitting EOB this date. Able to completed bilat knee flexion/extension. BLE ab/adduction, shoulder and elbow flexion/extension, and BUE horizontal rotation. No complaints throughout, only requiring cues as pt would fall asleep between exercises. Pt left in bed with call bell and all needs met.  D'Vante Earlene Plater Mobility Specialist Please contact via Special educational needs teacher or Rehab office at [phone]

## 2023-06-16 NOTE — TOC Progression Note (Signed)
 Transition of Care [facility]) - Progression Note    Patient Details  Name: Deborah [name] MRN: [id number] Date of Birth: 02-Nov-1965  Transition of Care [facility]) CM/SW Contact  Dellie Burns [place], [place] Phone Number: 06/16/2023, 3:42 PM  Clinical Narrative: [facility] and Community/[facility] has denied SNF placement for rehab. Spoke to [place] at [facility] who confirmed they are prepared to admit pt beginning tomorrow under Medicaid for LTC. MD and Renal Navigator updated.   Dellie Burns, MSW, LCSW [phone] (coverage)        Expected Discharge Plan: Skilled Nursing Facility Barriers to Discharge: Continued Medical Work up, Other (must enter comment) (Awaiting PT/OT to see)  Expected Discharge Plan and Services In-house Referral: Clinical Social Work     Living arrangements for the past 2 months: Skilled Nursing Facility                   DME Agency: NA                   Social Determinants of Health (SDOH) Interventions SDOH Screenings   Food Insecurity: No Food Insecurity (05/29/2023)  Housing: Patient Declined (05/29/2023)  Recent Concern: Housing - High Risk (04/25/2023)  Transportation Needs: Unmet Transportation Needs (05/29/2023)  Utilities: Not At Risk (05/29/2023)  Depression (PHQ2-9): Medium Risk (03/16/2023)  Financial Resource Strain: Low Risk (03/23/2021)   [facility])  Physical Activity: Not on File (10/31/2017)   Received from [place], [place]  Social Connections: Unknown (07/19/2021)   Received from [facility]  Stress: Low Risk (03/23/2021)   Received [facility])  Tobacco Use: Low Risk  (05/29/2023)    Readmission Risk Interventions    03/07/2023    4:19 PM 11/25/2022    5:16 PM 07/05/2022    1:20 PM  Readmission Risk Prevention Plan  Transportation Screening Complete Complete Complete  Medication Review (RN Care Manager) Complete Complete Referral to  Pharmacy  PCP or Specialist appointment within 3-5 days of discharge Complete Complete Complete  HRI or Home Care Consult Complete Complete Complete  SW Recovery Care/Counseling Consult Complete Complete Complete  Palliative Care Screening Complete Not Applicable Not Applicable  Skilled Nursing Facility Not Applicable Not Applicable Not Applicable

## 2023-06-16 NOTE — Progress Notes (Signed)
 Deborah [name]  WUJ:[id number] DOB: Feb 08, 1966 DOA: 05/29/2023 PCP: Pcp, No    Brief Narrative:  58 year old with a history of ESRD on HD previously TTS, chronic gastroparesis with GERD, PAF not on AC, diastolic CHF, gout, duodenal carcinoid tumor, PVD status post right BKA, gastric AVMs, NASH, depression/anxiety, obesity, restrictive lung disease, spinal stenosis with chronic pain syndrome, DM2, chronic left heel diabetic ulcer, and chronic diarrhea related to carcinoid tumor who presented to the ED with complaints of worsening diarrhea and abdominal pain in the setting of outpatient diagnosed C. difficile.  Goals of Care:   Code Status: Full Code   DVT prophylaxis: heparin injection 5,000 Units Start: 05/29/23 1400   Interim Hx: No acute events recorded overnight.  Afebrile.  Vital signs stable.  When I first arrived on the unit the patient appeared to be resting relatively comfortably in a bedside chair speaking with her mother.  After she saw me in the hall she then began a call out and moan stating that she had severe uncontrolled pain.  On my entry to the room she makes reference to her "need for IV Dilaudid" and tells me that the current oral medication is less than her usual home oral dose.  She cannot provide other significant history as she is focused on her pain medication and argues persistently to have her IV Dilaudid resumed.  I have explained to her in detail that ongoing IV pain medication at this time is not appropriate but that I will in fact adjust her oral dose to reflect that that she is on at home.  I have attempted to explain to her that I do in fact take her pain seriously but that also cannot endanger her life by continuing to expose her to high doses of IV narcotics the day after day.  I have also explained that oral narcotics provide more consistent longer lasting relief than IV narcotics.  Assessment & Plan:  Chronic gastroparesis Symptoms have stabilized to her  baseline - patient tolerating oral intake - continue supportive medical therapy and antiemetics -I would like to minimize narcotic use as I fear this is likely worsening her gastroparesis but at present her reported pain does not make this possible  Chronic diarrhea with recent history of C. difficile C. difficile negative this admission -clinically stable at this time  Malignant carcinoid tumor of the duodenum Status post excision -continues to have intermittent diarrhea which is controlled with Lomotil as needed  Chronic pain syndrome - chronic abdominal pain Pain medications have been titrated during this hospital stay -transitioned to oral hydromorphone 4/9 with patient making repeated request for IV hydromorphone -oral pain medications adjusted today but will avoid resumption of IV pain medications with the hope of obtaining more consistent longer-term control with oral pain medications  Anxiety/depression Continue Remeron, BuSpar, and hydroxyzine - follow-up with Psychiatry as an outpatient  ESRD on HD HD per nephrology with patient now on a MWF schedule   Anemia of chronic kidney disease Erythropoietin per nephrology -hemoglobin stable  Acute exacerbation on chronic diastolic CHF Patient has been noncompliant with renal diet and outpatient HD -exacerbation resolved with inpatient HD management  Paroxysmal atrial fibrillation Continue metoprolol -not on anticoagulation due to history of GI bleeding 2024 -rate controlled  DM 2 CBG well-controlled during this admission  Gout Continue allopurinol -no evidence of acute flare at this time  Chronic wound of left heel  Morbid obesity - Body mass index is 42.31 kg/m (pended).  Family Communication: Spoke with mother at bedside Disposition: Medically stable for discharge to SNF   Objective: Blood pressure (!) 95/55, pulse 88, temperature 98.6 F (37 C), resp. rate 18, height (P) 5\' 6"  (1.676 m), weight 118.9 kg, last  menstrual period 10/10/2012, SpO2 98%.  Intake/Output Summary (Last 24 hours) at 06/16/2023 1034 Last data filed at 06/16/2023 0600 Gross per 24 hour  Intake 400 ml  Output 0 ml  Net 400 ml   Filed Weights   06/15/23 1248 06/15/23 1809 06/16/23 0552  Weight: 119.4 kg 116.2 kg 118.9 kg    Examination: General: No acute respiratory distress Lungs: Clear to auscultation bilaterally Cardiovascular: Regular rate and rhythm without murmur  Abdomen: Nontender, nondistended, soft, bowel sounds positive, no rebound Extremities: No significant edema lower extremities  CBC: Recent Labs  Lab 06/10/23 2136 06/15/23 1245  WBC 12.2* 9.4  HGB 8.4* 7.6*  HCT 27.7* 25.5*  MCV 99.6 98.5  PLT 320 354   Basic Metabolic Panel: Recent Labs  Lab 06/12/23 1401 06/13/23 0459 06/15/23 1245  NA 133* 136 136  K 4.6 4.7 4.1  CL 99 101 99  CO2 19* 19* 22  GLUCOSE 228* 102* 106*  BUN 62* 66* 49*  CREATININE 7.86* 8.19* 7.74*  CALCIUM 8.4* 8.8* 8.1*  PHOS 8.0* 8.2* 7.3*   GFR: Estimated Creatinine Clearance: 10.3 mL/min (A) (by C-G formula based on SCr of 7.74 mg/dL (H)).   Scheduled Meds:  (feeding supplement) PROSource Plus  30 mL Oral BID BM   acidophilus  1 capsule Oral TID WC   allopurinol  100 mg Oral BID   atorvastatin  10 mg Oral QPM   busPIRone  5 mg Oral TID   cholestyramine light  4 g Oral BID   darbepoetin (ARANESP) injection - DIALYSIS  200 mcg Subcutaneous Q Wed-1800   diclofenac Sodium  2 g Topical QID   DULoxetine  30 mg Oral Daily   famotidine  20 mg Oral QAC breakfast   ferric citrate  420 mg Oral TID WC   folic acid  1 mg Oral Daily   Gerhardt's butt cream   Topical BID   heparin  5,000 Units Subcutaneous Q8H    HYDROmorphone (DILAUDID) injection  2 mg Intravenous Once   hyoscyamine  0.125 mg Sublingual TID   insulin aspart  0-6 Units Subcutaneous TID WC   loperamide  2 mg Oral Q6H   methocarbamol  500 mg Oral TID   metoprolol tartrate  12.5 mg Oral BID    midodrine  10 mg Oral Q T,Th,Sa-HD   mirtazapine  15 mg Oral QHS   pantoprazole  40 mg Oral BID   promethazine  25 mg Oral TID AC & HS   risperiDONE  1 mg Oral QHS   traZODone  50 mg Oral QHS     LOS: 17 days   Lonia Blood, MD [facility] Office  [phone] Pager - Text Page per Loretha Stapler  If 7PM-7AM, please contact night-coverage per Amion 06/16/2023, 10:34 AM

## 2023-06-16 NOTE — Progress Notes (Addendum)
 [facility] Progress Note   Subjective:  Seen in room - resting, but answers questions. No dyspnea/CP. SNF insurance authorization is pending.  Objective Vitals:   06/15/23 2204 06/16/23 0532 06/16/23 0552 06/16/23 0953  BP: (!) 119/59 (!) 93/50  (!) 95/55  Pulse: 96 91  88  Resp:  17  18  Temp:  98.9 F (37.2 C)  98.6 F (37 C)  TempSrc:  Oral    SpO2:  98%  98%  Weight:   118.9 kg   Height:       Physical Exam General: Chronically ill appearing woman, NAD. Room air Heart: RRR Lungs: CTAB, no rales Extremities: R BKA, LLE bandaged with 3+ edema Dialysis Access: TDC in R chest, LUE AVF    Additional Objective Labs: Basic Metabolic Panel: Recent Labs  Lab 06/12/23 1401 06/13/23 0459 06/15/23 1245  NA 133* 136 136  K 4.6 4.7 4.1  CL 99 101 99  CO2 19* 19* 22  GLUCOSE 228* 102* 106*  BUN 62* 66* 49*  CREATININE 7.86* 8.19* 7.74*  CALCIUM 8.4* 8.8* 8.1*  PHOS 8.0* 8.2* 7.3*   Liver Function Tests: Recent Labs  Lab 06/12/23 1401 06/13/23 0459 06/15/23 1245  ALBUMIN 2.1* 2.3* 2.0*   CBC: Recent Labs  Lab 06/10/23 2136 06/15/23 1245  WBC 12.2* 9.4  HGB 8.4* 7.6*  HCT 27.7* 25.5*  MCV 99.6 98.5  PLT 320 354   Medications:   (feeding supplement) PROSource Plus  30 mL Oral BID BM   acidophilus  1 capsule Oral TID WC   allopurinol  100 mg Oral BID   atorvastatin  10 mg Oral QPM   busPIRone  5 mg Oral TID   cholestyramine light  4 g Oral BID   darbepoetin (ARANESP) injection - DIALYSIS  200 mcg Subcutaneous Q Wed-1800   diclofenac Sodium  2 g Topical QID   DULoxetine  30 mg Oral Daily   famotidine  20 mg Oral QAC breakfast   ferric citrate  420 mg Oral TID WC   folic acid  1 mg Oral Daily   Gerhardt's butt cream   Topical BID   heparin  5,000 Units Subcutaneous Q8H    HYDROmorphone (DILAUDID) injection  2 mg Intravenous Once   hyoscyamine  0.125 mg Sublingual TID   insulin aspart  0-6 Units Subcutaneous TID WC   loperamide  2 mg  Oral Q6H   methocarbamol  500 mg Oral TID   metoprolol tartrate  12.5 mg Oral BID   midodrine  10 mg Oral Q T,Th,Sa-HD   mirtazapine  15 mg Oral QHS   pantoprazole  40 mg Oral BID   promethazine  25 mg Oral TID AC & HS   risperiDONE  1 mg Oral QHS   traZODone  50 mg Oral QHS    Dialysis Orders TTS - [facility] -> WILL BE MWF - [facility] ON D/C 4hr, 400/800, EDW 101.5kg, 3K/2.5Ca bath, TDC, no heparin - Mircera IV q 2 weeks - last given 3/15 - Hectoral IV q HD   Assessment/Plan: Gastroparesis + chronic diarrhea: Improving. Recent C.diff - negative here. ESRD: Usual TTS schedule - has refused HD at least 2X, so off schedule this admit. S/p HD 4/9. Will be on MWF schedule moving forward - accepted at new HD unit and can start tomorrow (4/10), pending SNF insurance issues. Orders placed for HD here in case she is not able to discharge today. Vascular access: Refusing AVF use.  [facility] consulted - ok'd for use as of 4/4. TDC in place. L heel wound: Wound care following. HypoTN/volume: BP chronically soft. Uses midodrine pre-HD. Remains way above her prior EDW, but getting closer, 3.2L off with yesterday's HD. Anemia of ESRD: Hgb 7.6 - continue Aranesp q Wed while here. Secondary HPTH: Ca ok, Phos high but coming down with higher dose Auryxia. Nutrition: Albumin low, continue protein supps. T2DM A-fib: Not on AC d/t recent GIB Chronic pain syndrome Mood disorder/depression: Appreciate psych consult 4/7, was started on duloxetine.   Ozzie Hoyle, PA-C 06/16/2023, 9:58 AM  [employer]

## 2023-06-16 NOTE — Progress Notes (Signed)
 Physical Therapy Treatment Patient Details Name: Deborah [name] MRN: [id number] DOB: 08-27-1965 Today's Date: 06/16/2023   History of Present Illness 58 y/o female presents to [facility] on 05/29/23 with resports of chest pain, abdominal pain, and diahrrea in setting of C-diff. Missed 2 dialysis sessions, fluid overloaded.  PMHx: ESRD on dialysis, gastroparesis, GERD, anemia of chronic disease, PAF, diastolic heart failure with preserved EF 55 to 60%, gout, duodenal carcinoid tumor, PVD s/p R BKA, history of gastric AVM, NASH, reactive dyskinesia, depression, anxiety, obesity, restrictive lung disease, spinal stenosis, chronic pain syndrome, insulin-dependent DM type II, chronic left heel diabetic ulcer, and history of chronic diarrhea 2/2 carcinoid tumor.    PT Comments  Pt very emotional during session today, see below for further details. Worked on sitting balance and core strengthening EOB. With much encouragement pt agreeable to sitting in chair. Maximove used to transfer pt from bed to recliner. Pt left in chair and encouraged to try to eat her lunch (did not eat breakfast due to abdominal pain). Patient will benefit from continued inpatient follow up therapy, <3 hours/day. PT will continue to follow.     If plan is discharge home, recommend the following: A lot of help with walking and/or transfers;A lot of help with bathing/dressing/bathroom;Assistance with cooking/housework;Direct supervision/assist for medications management;Direct supervision/assist for financial management;Assist for transportation;Help with stairs or ramp for entrance   Can travel by private vehicle     No  Equipment Recommendations  None recommended by PT    Recommendations for Other Services       Precautions / Restrictions Precautions Precautions: Fall Recall of Precautions/Restrictions: Impaired Precaution/Restrictions Comments: R BKA Restrictions Weight Bearing Restrictions Per Provider Order: Yes      Mobility  Bed Mobility Overal bed mobility: Needs Assistance Bed Mobility: Supine to Sit Rolling: Supervision         General bed mobility comments: came to EOB without physical assist    Transfers Overall transfer level: Needs assistance Equipment used: Ambulation equipment used Transfers: Bed to chair/wheelchair/BSC             General transfer comment: after much encouragement and discussion of goals and depression and how changing location may help her mental state as well as encouraging her mother when she comes up to visit (which pt does care about), pt agreeable to transfer to chair with maximove. Transfer via Lift Equipment: Maximove  Ambulation/Gait               General Gait Details: non ambulatory   Stairs             Wheelchair Mobility     Tilt Bed    Modified Rankin (Stroke Patients Only)       Balance Overall balance assessment: Needs assistance Sitting-balance support: No upper extremity supported, Feet supported Sitting balance-Leahy Scale: Good Sitting balance - Comments: pt able to lean all the way back and return to upright. Worked on R and L lean for core strengthening                                    Communication Communication Communication: No apparent difficulties  Cognition   Behavior During Therapy: Lability   PT - Cognitive impairments: No family/caregiver present to determine baseline                       PT - Cognition Comments: very emotionally  labile today Following commands: Impaired Following commands impaired: Follows one step commands with increased time, Follows one step commands inconsistently    Cueing Cueing Techniques: Verbal cues  Exercises      General Comments General comments (skin integrity, edema, etc.): VSS. Pt spoke with mother on phone during session. RN present as well      Pertinent Vitals/Pain Pain Assessment Pain Assessment: 0-10 Pain Score:  10-Worst pain ever Pain Location: abdomen Pain Descriptors / Indicators: Aching, Throbbing, Discomfort, Crying Pain Intervention(s): Patient requesting pain meds-RN notified, RN gave pain meds during session, Limited activity within patient's tolerance    Home Living                          Prior Function            PT Goals (current goals can now be found in the care plan section) Acute Rehab PT Goals Patient Stated Goal: Decrease pain and go to rehab PT Goal Formulation: With patient Time For Goal Achievement: 06/20/23 Potential to Achieve Goals: Fair Progress towards PT goals: Progressing toward goals    Frequency    Min 1X/week      PT Plan      Co-evaluation              AM-PAC PT "6 Clicks" Mobility   Outcome Measure  Help needed turning from your back to your side while in a flat bed without using bedrails?: A Lot Help needed moving from lying on your back to sitting on the side of a flat bed without using bedrails?: A Lot Help needed moving to and from a bed to a chair (including a wheelchair)?: Total Help needed standing up from a chair using your arms (e.g., wheelchair or bedside chair)?: Total Help needed to walk in hospital room?: Total Help needed climbing 3-5 steps with a railing? : Total 6 Click Score: 8    End of Session   Activity Tolerance: Patient tolerated treatment well Patient left: with call bell/phone within reach;in chair Nurse Communication: Mobility status;Need for lift equipment PT Visit Diagnosis: Muscle weakness (generalized) (M62.81);Pain Pain - part of body:  (abdomen)     Time: 1201-1259 PT Time Calculation (min) (ACUTE ONLY): 58 min  Charges:    $Therapeutic Activity: 23-37 mins $Neuromuscular Re-education: 8-22 mins $Self Care/Home Management: 8-22 PT General Charges $$ ACUTE PT VISIT: 1 Visit                     Lyanne Co, PT  Acute Rehab Services Secure chat preferred Office  [phone]    [place] L Shuan Statzer 06/16/2023, 1:16 PM

## 2023-06-17 ENCOUNTER — Ambulatory Visit: Payer: 59

## 2023-06-17 ENCOUNTER — Inpatient Hospital Stay (HOSPITAL_COMMUNITY): Payer: 59

## 2023-06-17 DIAGNOSIS — K3184 Gastroparesis: Secondary | ICD-10-CM | POA: Diagnosis not present

## 2023-06-17 LAB — CBC
HCT: 27.9 % — ABNORMAL LOW (ref 36.0–46.0)
Hemoglobin: 8.2 g/dL — ABNORMAL LOW (ref 12.0–15.0)
MCH: 29.1 pg (ref 26.0–34.0)
MCHC: 29.4 g/dL — ABNORMAL LOW (ref 30.0–36.0)
MCV: 98.9 fL (ref 80.0–100.0)
Platelets: 368 10*3/uL (ref 150–400)
RBC: 2.82 MIL/uL — ABNORMAL LOW (ref 3.87–5.11)
RDW: 15.1 % (ref 11.5–15.5)
WBC: 8.7 10*3/uL (ref 4.0–10.5)
nRBC: 0 % (ref 0.0–0.2)

## 2023-06-17 LAB — RENAL FUNCTION PANEL
Albumin: 2.4 g/dL — ABNORMAL LOW (ref 3.5–5.0)
Anion gap: 16 — ABNORMAL HIGH (ref 5–15)
BUN: 29 mg/dL — ABNORMAL HIGH (ref 6–20)
CO2: 22 mmol/L (ref 22–32)
Calcium: 8.3 mg/dL — ABNORMAL LOW (ref 8.9–10.3)
Chloride: 100 mmol/L (ref 98–111)
Creatinine, Ser: 6.28 mg/dL — ABNORMAL HIGH (ref 0.44–1.00)
GFR, Estimated: 7 mL/min — ABNORMAL LOW (ref >60)
Glucose, Bld: 210 mg/dL — ABNORMAL HIGH (ref 70–99)
Phosphorus: 6.3 mg/dL — ABNORMAL HIGH (ref 2.5–4.6)
Potassium: 3.4 mmol/L — ABNORMAL LOW (ref 3.5–5.1)
Sodium: 138 mmol/L (ref 135–145)

## 2023-06-17 LAB — GLUCOSE, CAPILLARY
Glucose-Capillary: 151 mg/dL — ABNORMAL HIGH (ref 70–99)
Glucose-Capillary: 234 mg/dL — ABNORMAL HIGH (ref 70–99)
Glucose-Capillary: 123 mg/dL — ABNORMAL HIGH (ref 70–99)

## 2023-06-17 MED ORDER — ACETAMINOPHEN 325 MG PO TABS
325.0000 mg | ORAL_TABLET | Freq: Once | ORAL | Status: AC
  Administered 2023-06-17: 325 mg via ORAL
  Filled 2023-06-17: qty 1

## 2023-06-17 MED ORDER — CLONIDINE HCL 0.1 MG PO TABS
0.1000 mg | ORAL_TABLET | Freq: Two times a day (BID) | ORAL | Status: DC
  Administered 2023-06-17 – 2023-06-20 (×7): 0.1 mg via ORAL
  Filled 2023-06-17 (×7): qty 1

## 2023-06-17 MED ORDER — TRAZODONE HCL 50 MG PO TABS: 50.0000 mg | ORAL_TABLET | Freq: Every day | ORAL | Status: AC

## 2023-06-17 MED ORDER — NEPHRO-VITE 0.8 MG PO TABS: 1.0000 | ORAL_TABLET | Freq: Every day | ORAL | Status: AC

## 2023-06-17 MED ORDER — RISAQUAD PO CAPS: 1.0000 | ORAL_CAPSULE | Freq: Three times a day (TID) | ORAL | Status: DC

## 2023-06-17 MED ORDER — INSULIN ASPART 100 UNIT/ML IJ SOLN: 0.0000 [IU] | Freq: Three times a day (TID) | INTRAMUSCULAR | Status: DC

## 2023-06-17 MED ORDER — DICLOFENAC SODIUM 1 % EX GEL: 2.0000 g | Freq: Four times a day (QID) | CUTANEOUS | Status: AC

## 2023-06-17 MED ORDER — LOPERAMIDE HCL 2 MG PO CAPS: 2.0000 mg | ORAL_CAPSULE | Freq: Four times a day (QID) | ORAL | Status: AC

## 2023-06-17 MED ORDER — CHOLESTYRAMINE LIGHT 4 G PO PACK: 4.0000 g | PACK | Freq: Two times a day (BID) | ORAL | Status: AC

## 2023-06-17 MED ORDER — HYDROMORPHONE HCL 4 MG PO TABS
4.0000 mg | ORAL_TABLET | Freq: Four times a day (QID) | ORAL | 0 refills | Status: DC | PRN
Start: 2023-06-17 — End: 2023-06-20

## 2023-06-17 MED ORDER — CALCIUM CARBONATE ANTACID 500 MG PO CHEW: 1.0000 | CHEWABLE_TABLET | Freq: Two times a day (BID) | ORAL | Status: AC | PRN

## 2023-06-17 MED ORDER — METOPROLOL TARTRATE 25 MG PO TABS: 12.5000 mg | ORAL_TABLET | Freq: Two times a day (BID) | ORAL | Status: DC

## 2023-06-17 MED ORDER — DARBEPOETIN ALFA 200 MCG/0.4ML IJ SOSY: 200.0000 ug | PREFILLED_SYRINGE | INTRAMUSCULAR | Status: DC

## 2023-06-17 MED ORDER — DULOXETINE HCL 30 MG PO CPEP: 30.0000 mg | ORAL_CAPSULE | Freq: Every day | ORAL | Status: DC

## 2023-06-17 MED ORDER — HYOSCYAMINE SULFATE 0.125 MG SL SUBL: 0.1250 mg | SUBLINGUAL_TABLET | Freq: Three times a day (TID) | SUBLINGUAL | Status: AC

## 2023-06-17 MED ORDER — SODIUM CHLORIDE 0.9% FLUSH
10.0000 mL | Freq: Two times a day (BID) | INTRAVENOUS | Status: DC
  Administered 2023-06-17 – 2023-06-19 (×4): 10 mL

## 2023-06-17 MED ORDER — MIDODRINE HCL 5 MG PO TABS
10.0000 mg | ORAL_TABLET | ORAL | Status: DC
  Administered 2023-06-17 – 2023-06-18 (×2): 10 mg via ORAL
  Filled 2023-06-17: qty 2

## 2023-06-17 MED ORDER — FERRIC CITRATE 1 GM 210 MG(FE) PO TABS: 420.0000 mg | ORAL_TABLET | Freq: Three times a day (TID) | ORAL | Status: DC

## 2023-06-17 MED ORDER — PROMETHAZINE HCL 25 MG PO TABS: 25.0000 mg | ORAL_TABLET | Freq: Three times a day (TID) | ORAL | Status: AC

## 2023-06-17 MED ORDER — MIDODRINE HCL 10 MG PO TABS: 10.0000 mg | ORAL_TABLET | ORAL | Status: AC

## 2023-06-17 MED ORDER — HYDROXYZINE HCL 25 MG PO TABS: 25.0000 mg | ORAL_TABLET | ORAL | Status: AC | PRN

## 2023-06-17 MED ORDER — ONDANSETRON HCL 4 MG/2ML IJ SOLN: 4.0000 mg | Freq: Four times a day (QID) | INTRAMUSCULAR | Status: DC | PRN

## 2023-06-17 MED ORDER — SODIUM CHLORIDE 0.9% FLUSH: 10.0000 mL | INTRAVENOUS | Status: DC | PRN

## 2023-06-17 NOTE — Progress Notes (Signed)
 Mobility Specialist Progress Note:    06/17/23 0900  Mobility  Activity Turned to right side;Turned to left side;Turned to back - supine  Level of Assistance Standby assist, set-up cues, supervision of patient - no hands on  Assistive Device None  Range of Motion/Exercises Active;All extremities  Activity Response Tolerated well  Mobility Referral Yes  Mobility visit 1 Mobility  Mobility Specialist Start Time (ACUTE ONLY) 0825  Mobility Specialist Stop Time (ACUTE ONLY) 0843  Mobility Specialist Time Calculation (min) (ACUTE ONLY) 18 min   Pt received in chair and agreeable. Declined transferring to bed and c/o pain all over. Able to complete shoulder flexion/extension, elbow flexion/extension, ankle pumps, and knee flexion/extension. Asymptomatic throughout. Pt then able to turn and be placed on bedpan at EOS. NT notified.  Deborah [name] Mobility Specialist Please contact via Special educational needs teacher or Rehab office at [phone]

## 2023-06-17 NOTE — Progress Notes (Signed)
 We are offering the treating practitioner an option to speak with a Medical Director before final determination. Provider to call: [phone] Option 5. Deadline is: 06/17/23 9:00 am EDT If no response, MD will render determination.   P2P was not done. Case denied due to lack of movement by patient, no transfer out of bed participation.

## 2023-06-17 NOTE — Progress Notes (Signed)
 Per patient's nurse ,patient is refusing to go on her hd treatment this morning.

## 2023-06-17 NOTE — Discharge Summary (Addendum)
 DISCHARGE SUMMARY  Deborah [name]  MR#: [id number]  DOB:Jul 23, 1965  Date of Admission: 05/29/2023 Date of Discharge: 06/17/2023  Attending Physician:Ladena Jacquez Silvestre Gunner, MD  Patient's PCP:Pcp, No  Disposition: D/C to SNF  Follow-up Appts:  Follow-up Information     Nadara Mustard, MD Follow up in 2 week(s).   Specialty: Orthopedic Surgery Contact information: [address] [place] [address] [phone]         [facility]. Go on 06/20/2023.   Why: Schedule is Monday, Wednesday, Friday with 7:05 am start time.  Please arrive at 6:45 am. Contact information: [address] [place], [place] [address] [phone]                Tests Needing Follow-up: -assess adequacy of pain control   Discharge Diagnoses: Chronic gastroparesis Chronic diarrhea with recent history of C. difficile Malignant carcinoid tumor of the duodenum Chronic pain syndrome - chronic abdominal pain Anxiety/depression ESRD on HD Anemia of chronic kidney disease Acute exacerbation on chronic diastolic CHF Paroxysmal atrial fibrillation DM 2 Gout Morbid obesity - Body mass index is 42.31 kg/m (pended).  Initial presentation: 58 year old with a history of ESRD on HD, chronic gastroparesis with GERD, PAF not on AC, diastolic CHF, gout, duodenal carcinoid tumor, PVD status post right BKA, gastric AVMs, NASH, depression/anxiety, obesity, restrictive lung disease, spinal stenosis with chronic pain syndrome, DM2, chronic left heel diabetic ulcer, and chronic diarrhea related to carcinoid tumor who presented to the ED with complaints of worsening diarrhea and abdominal pain in the setting of outpatient diagnosed C. difficile.   Hospital Course:  Chronic gastroparesis Symptoms have stabilized to her baseline - patient tolerating oral intake - continue supportive medical therapy and antiemetics -I would like to minimize narcotic use as I fear this is likely worsening  her gastroparesis but at present her reported pain does not make this possible   Chronic diarrhea with recent history of C. difficile C. difficile negative this admission -clinically stable at time of discharge   Malignant carcinoid tumor of the duodenum Status post excision -continues to have intermittent diarrhea which is controlled with Lomotil as needed -this is her baseline   Chronic pain syndrome - chronic abdominal pain Pain medications have been titrated during this hospital stay -transitioned to oral hydromorphone 4/9 with preceding extensive discussion with the patient with patient nonetheless making repeated requests for IV hydromorphone after the transition - oral pain medications adjusted/increased 4/10 but will avoid resumption of IV pain medications with the hope of obtaining more consistent longer-term control with oral pain medications -patient counseled that converting back to high-dose IV narcotics only places her at danger of significant harm and even death and will provide inferior long-term pain control compared to consistent oral use   Anxiety/depression Continue Remeron, BuSpar, and hydroxyzine - follow-up with Psychiatry as an outpatient   ESRD on HD HD per nephrology with patient now on a MWF schedule -outpatient HD has been arranged   Anemia of chronic kidney disease Erythropoietin per nephrology -hemoglobin stable   Acute exacerbation on chronic diastolic CHF Patient has been noncompliant with renal diet and outpatient HD -exacerbation resolved with inpatient HD management   Paroxysmal atrial fibrillation Continue metoprolol - not on anticoagulation due to history of GI bleeding 2024 -rate controlled   DM 2 CBG well-controlled during this admission   Gout Continue allopurinol -no evidence of acute flare at this time   Morbid obesity - Body mass index is 42.31 kg/m (pended).  Allergies  as of 06/17/2023       Reactions   Gabapentin Hives, Shortness Of  Breath   Isovue [iopamidol] Anaphylaxis, [name], Other (See Comments)   11/28/17 Patient had seizure like activity and then 1 min code after 100 cc of isovue 300. Possible contrast allergy vs vasovagal episode Cardiac Arrest   Nsaids Anaphylaxis, Other (See Comments)   Hx of stomach ulcers   Penicillins Itching, Palpitations, Other (See Comments)   Flushing (Red Skin) Laryngeal Edema   Reglan [metoclopramide] Other (See Comments)   Tardive dyskinesia   Valium [diazepam] [name]   Zestril [lisinopril] Anaphylaxis, Swelling   Tongue and mouth swelling Laryngeal Edema   Tolectin [tolmetin] Nausea And Vomiting, Nausea Only, Other (See Comments)   Irritates stomach ulcer   Asa [aspirin] Other (See Comments)   Hx of stomach ulcer   Aspartame And Phenylalanine Hives   Bentyl [dicyclomine] Other (See Comments)   Chest pain   Hibiclens [chlorhexidine Gluconate] Other (See Comments)   Dermatitis    Flexeril [cyclobenzaprine] Palpitations   Oxycontin [oxycodone] Palpitations   Rifamycins Palpitations   Tylenol [acetaminophen] Nausea And Vomiting, Nausea Only, Other (See Comments)   Irritates stomach ulcer Abdominal pain   Ultram [tramadol] Nausea And Vomiting, Palpitations        Medication List     STOP taking these medications    Acidophilus Caps capsule   insulin lispro 100 UNIT/ML KwikPen Commonly known as: HUMALOG   SENIOR MULTIVITAMIN PLUS PO       TAKE these medications    (feeding supplement) PROSource Plus liquid Take 30 mLs by mouth 2 (two) times daily between meals.   albuterol (2.5 MG/3ML) 0.083% nebulizer solution Commonly known as: PROVENTIL Take 3 mLs (2.5 mg total) by nebulization every 6 (six) hours as needed for wheezing or shortness of breath.   allopurinol 100 MG tablet Commonly known as: ZYLOPRIM Take 1 tablet (100 mg total) by mouth 2 (two) times daily.   ascorbic acid 500 MG tablet Commonly known as: VITAMIN C Take  500 mg by mouth daily.   atorvastatin 10 MG tablet Commonly known as: LIPITOR Take 10 mg by mouth every evening.   b complex-vitamin c-folic acid 0.8 MG Tabs tablet Take 1 tablet by mouth at bedtime.   busPIRone 5 MG tablet Commonly known as: BUSPAR Take 1 tablet (5 mg total) by mouth 3 (three) times daily.   calcium carbonate 500 MG chewable tablet Commonly known as: TUMS - dosed in mg elemental calcium Chew 1 tablet (200 mg of elemental calcium total) by mouth 2 (two) times daily as needed for indigestion or heartburn.   cetirizine 10 MG tablet Commonly known as: ZYRTEC Take 10 mg by mouth daily.   cholestyramine light 4 g packet Commonly known as: PREVALITE Take 1 packet (4 g total) by mouth 2 (two) times daily.   diclofenac Sodium 1 % Gel Commonly known as: VOLTAREN Apply 2 g topically 4 (four) times daily.   DULoxetine 30 MG capsule Commonly known as: CYMBALTA Take 1 capsule (30 mg total) by mouth daily.   famotidine 20 MG tablet Commonly known as: PEPCID Take 20 mg by mouth daily before breakfast.   ferric citrate 1 GM 210 MG(Fe) tablet Commonly known as: AURYXIA Take 2 tablets (420 mg total) by mouth 3 (three) times daily with meals. What changed: how much to take   fluticasone 50 MCG/ACT nasal spray Commonly known as: FLONASE Place 2 sprays into both nostrils daily as needed  for allergies or rhinitis.   folic acid 1 MG tablet Commonly known as: FOLVITE Take 1 tablet (1 mg total) by mouth daily.   hydrocortisone cream 1 % Apply 1 Application topically 2 (two) times daily.   HYDROmorphone 4 MG tablet Commonly known as: DILAUDID Take 1 tablet (4 mg total) by mouth every 6 (six) hours as needed for severe pain (pain score 7-10). What changed:  medication strength how much to take reasons to take this   hydrOXYzine 25 MG tablet Commonly known as: ATARAX Take 1 tablet (25 mg total) by mouth every 4 (four) hours as needed for anxiety. What changed:  when to take this   hyoscyamine 0.125 MG SL tablet Commonly known as: LEVSIN SL Place 1 tablet (0.125 mg total) under the tongue 3 (three) times daily.   insulin aspart 100 UNIT/ML injection Commonly known as: novoLOG Inject 0-6 Units into the skin 3 (three) times daily with meals.   loperamide 2 MG capsule Commonly known as: IMODIUM Take 1 capsule (2 mg total) by mouth every 6 (six) hours.   methocarbamol 500 MG tablet Commonly known as: ROBAXIN Take 1 tablet (500 mg total) by mouth every 6 (six) hours as needed for muscle spasms.   metoprolol tartrate 25 MG tablet Commonly known as: LOPRESSOR Take 0.5 tablets (12.5 mg total) by mouth 2 (two) times daily. What changed: how much to take   midodrine 10 MG tablet Commonly known as: PROAMATINE Take 1 tablet (10 mg total) by mouth every Monday, Wednesday, and Friday with hemodialysis. What changed:  when to take this Another medication with the same name was removed. Continue taking this medication, and follow the directions you see here.   mirtazapine 15 MG tablet Commonly known as: REMERON Take 15 mg by mouth at bedtime.   ondansetron 4 MG disintegrating tablet Commonly known as: ZOFRAN-ODT Take 1 tablet (4 mg total) by mouth every 8 (eight) hours as needed for nausea or vomiting.   pantoprazole 40 MG tablet Commonly known as: PROTONIX Take 1 tablet (40 mg total) by mouth 2 (two) times daily. What changed: when to take this   promethazine 25 MG tablet Commonly known as: PHENERGAN Take 1 tablet (25 mg total) by mouth 4 (four) times daily -  before meals and at bedtime.   risperiDONE 1 MG disintegrating tablet Commonly known as: RISPERDAL M-TABS Take 1 tablet (1 mg total) by mouth at bedtime.   saccharomyces boulardii 250 MG capsule Commonly known as: FLORASTOR Take 250 mg by mouth in the morning, at noon, and at bedtime.   traZODone 50 MG tablet Commonly known as: DESYREL Take 1 tablet (50 mg total) by mouth at  bedtime.        Day of Discharge BP (!) 146/88 (BP Location: Right Wrist)   Pulse (!) 103   Temp 97.6 F (36.4 C) (Oral)   Resp 19   Ht (P) 5\' 6"  (1.676 m)   Wt 118.9 kg   LMP 10/10/2012   SpO2 100%   BMI (P) 42.31 kg/m   Physical Exam: General: No acute respiratory distress Lungs: Clear to auscultation bilaterally without wheezes or crackles Cardiovascular: Regular rate and rhythm without murmur gallop or rub normal S1 and S2 Abdomen: Nontender, nondistended, soft, bowel sounds positive, no rebound, no ascites, no appreciable mass Extremities: No significant cyanosis, clubbing, or edema bilateral lower extremities  Basic Metabolic Panel: Recent Labs  Lab 06/12/23 1401 06/13/23 0459 06/15/23 1245  NA 133* 136 136  K 4.6  4.7 4.1  CL 99 101 99  CO2 19* 19* 22  GLUCOSE 228* 102* 106*  BUN 62* 66* 49*  CREATININE 7.86* 8.19* 7.74*  CALCIUM 8.4* 8.8* 8.1*  PHOS 8.0* 8.2* 7.3*    CBC: Recent Labs  Lab 06/10/23 2136 06/15/23 1245  WBC 12.2* 9.4  HGB 8.4* 7.6*  HCT 27.7* 25.5*  MCV 99.6 98.5  PLT 320 354    Time spent in discharge (includes decision making & examination of pt): 35 minutes  06/17/2023, 11:39 AM   Lonia Blood, MD [facility] Office  [phone]

## 2023-06-17 NOTE — TOC Transition Note (Addendum)
 Transition of Care [facility]) - Discharge Note   Patient Details  Name: Deborah [name] MRN: [id number] Date of Birth: 08/25/1965  Transition [facility] CM/SW Contact:  Deatra Robinson, [place] Phone Number: 06/17/2023, 9:43 AM   Clinical Narrative: Pt for dc to [facility] today under Medicaid for LTC as [facility] denied rehab. Pt refused HD this AM. Per MD, pt able to dc to SNF today without HD and plan for resumption of regular HD schedule Monday. Updated Christy at [facility] who confirmed they are prepared to admit pt to room 306. RN provided with number for report and PTAR arranged for transport. SW signing off at dc.   UPDATE 1145: [facility] business office has rescinded bed offer as pt has used part of her disability check already this month. Pt aware of change in plan and is agreeable to other placement options. [facility], [facility], and [facility] reviewing. MD and RN updated.   UPDATE 1400: Bed offer received from [facility] with [facility]. Denials received for [facility] and [facility]. Provided pt with offer from [facility] and she has accepted. Pt aware this placement is for LTC and agreeable to relinquishing her check to [facility]. Tammy in [facility] admissions for [facility] requesting a 30 day LOG while they transition pt's Medicaid to LTC, approval for LOG received from [facility] leadership. Tammy also expressed concern re pt's pain control and requests this be addressed before they can admit potentially on Monday. Renal Navigator aware of need to change dialysis centers.     Dellie Burns, MSW, LCSW [phone] (coverage)      Final next level of care: Skilled Nursing Facility Barriers to Discharge: Barriers Resolved   Patient Goals and CMS Choice Patient states their goals for this hospitalization and ongoing recovery are:: To get better CMS Medicare.gov Compare Post Acute Care list provided to:: Patient Choice offered to / list presented to :  Patient [place] ownership interest in [facility].provided to:: Patient    Discharge Placement              Patient chooses bed at: Other - please specify in the comment section below: ([facility]) Patient to be transferred to facility by: PTAR Name of family member notified: Pt to update family Patient and family notified of of transfer: 06/17/23  Discharge Plan and Services Additional resources added to the After Visit Summary for   In-house Referral: Clinical Social Work                DME Agency: NA                  Social Drivers of Health (SDOH) Interventions SDOH Screenings   Food Insecurity: No Food Insecurity (05/29/2023)  Housing: Patient Declined (05/29/2023)  Recent Concern: Housing - High Risk (04/25/2023)  Transportation Needs: Unmet Transportation Needs (05/29/2023)  Utilities: Not At Risk (05/29/2023)  Depression (PHQ2-9): Medium Risk (03/16/2023)  Financial Resource Strain: Low Risk (03/23/2021)   [facility])  Physical Activity: Not on File (10/31/2017)   Received from [place], [place]  Social Connections: Unknown (07/19/2021)   [facility] from [facility]  Stress: Low Risk (03/23/2021)   [facility])  Tobacco Use: Low Risk  (05/29/2023)     Readmission Risk Interventions    03/07/2023    4:19 PM 11/25/2022    5:16 PM 07/05/2022    1:20 PM  Readmission Risk Prevention Plan  Transportation Screening  Complete Complete Complete  Medication Review (RN Care Manager) Complete Complete Referral to Pharmacy  PCP or Specialist appointment within 3-5 days of discharge Complete Complete Complete  HRI or Home Care Consult Complete Complete Complete  SW Recovery Care/Counseling Consult Complete Complete Complete  Palliative Care Screening Complete Not Applicable Not Applicable  Skilled Nursing Facility Not Applicable Not Applicable Not Applicable

## 2023-06-17 NOTE — Progress Notes (Addendum)
 Patient asleep in the chair when this nurse entered room. This nurse awakened patient to notify her of transport coming and need to transfer to bed for dialysis session this morning. Patient reported she needed to use bathroom before going and needed pain medication. This nurse agreed to help patient to bathroom and notified patient that pain medication is not available to be given at this time. Patient becomes upset and says "I'm not going to dialysis this morning, I'll go this afternoon." This nurse informed patient of limited availability in dialysis and patient may be unable to have dialysis today if patient does not go at this time. Patient continues to state "I'll go this afternoon." When this nurse asks patient to take morning medications while she is awake, patient says "I'm not taking them right now. I haven't even ate breakfast." This nurse pointed to breakfast tray in room and asked patient if she would like to eat now and patient said "I'm not eating that." Dr. [name] Seller and hemodialysis made aware of patient refusal.

## 2023-06-17 NOTE — Progress Notes (Signed)
 Entered patient's room due to patient screaming. Patient in recliner c/o abd and chest pain. BP 134/104, HR 104, but patient refused to hold arm still while BP taking and continues to yell out. Patient reports "needing something else for pain." Denies any precipitating factors. Dr. [name] Seller made aware, new orders placed.

## 2023-06-17 NOTE — Progress Notes (Signed)
 This chaplain responded to unit consult for spiritual care, specifically prayer. The chaplain reviewed the chart notes and received an update from the Pt. RN-Patricia before the visit.   The chaplain entered the Pt. room with an introduction and invitation for spiritual care. The chaplain noticed the Pt. dark room and flat effect. After sharing the introduction, the chaplain emphasized the Pt. choice of a visit. The Pt. shook her head no. The chaplain offered a visit at another time through a RN request.  Theodosia Paling [phone]

## 2023-06-17 NOTE — Consult Note (Signed)
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 Kathryn R, PA-C, 10 mL at 06/17/23 1018   sodium chloride flush (NS) 0.9 % injection 10-40 mL, 10-40 mL, Intracatheter, PRN, Julien Nordmann, PA-C   traZODone (DESYREL) tablet 50 mg, 50 mg, Oral, QHS, [name], York Ram, MD, 50 mg at 06/16/23 2211  Allergies: Allergies  Allergen Reactions   Gabapentin Hives and [name]   Isovue [Iopamidol] Anaphylaxis, [name] and Other (See Comments)    11/28/17 Patient had seizure like activity and then 1 min code after 100 cc of isovue 300. Possible contrast allergy vs vasovagal episode  Cardiac Arrest   Nsaids Anaphylaxis and Other (See Comments)    Hx of stomach ulcers   Penicillins Itching, Palpitations and Other (See Comments)    Flushing (Red Skin) Laryngeal Edema   Reglan [Metoclopramide]  Other (See Comments)    Tardive dyskinesia    Valium [Diazepam] [name]   Zestril [Lisinopril] Anaphylaxis and Swelling    Tongue and mouth swelling Laryngeal Edema   Tolectin [Tolmetin] Nausea And Vomiting, Nausea Only and Other (See Comments)    Irritates stomach  ulcer   Asa [Aspirin] Other (See Comments)    Hx of stomach ulcer   Aspartame And Phenylalanine Hives   Bentyl [Dicyclomine] Other (See Comments)    Chest pain   Hibiclens [Chlorhexidine Gluconate] Other (See Comments)    Dermatitis    Flexeril [Cyclobenzaprine] Palpitations   Oxycontin [Oxycodone] Palpitations   Rifamycins Palpitations   Tylenol [Acetaminophen] Nausea And Vomiting, Nausea Only and Other (See Comments)    Irritates stomach ulcer Abdominal pain   Ultram [Tramadol] Nausea And Vomiting and Palpitations    Delayla Hoffmaster, NP

## 2023-06-17 NOTE — Progress Notes (Signed)
  [facility] Progress Note   Subjective:   Pt refused HD this morning. She reports she is not able to go to dialysis due to full body pain. Reports she needs IV pain medications. Encouraged her to talk to her primary MD and will try again for dialysis this afternoon. Denies SOB, CP, dizziness, nausea.   Objective Vitals:   06/16/23 1923 06/16/23 2237 06/17/23 0444 06/17/23 0842  BP: 126/79 125/66 129/67 (!) 146/88  Pulse: (!) 103 86 93 (!) 103  Resp: 17 19 19    Temp: 98 F (36.7 C) 98.1 F (36.7 C) (!) 97.4 F (36.3 C) 97.6 F (36.4 C)  TempSrc: Oral Oral Oral Oral  SpO2: 100% 100% 100% 100%  Weight:      Height:       Physical Exam General: Alert female in NAD Heart: RRR, no murmurs, rubs or gallops Lungs: CTA bilaterally, respirations unlabored on RA Abdomen: +BS Extremities: R BKA, LLE 3+ tense edema Dialysis Access:  TDC in R chest, RUE AVF + T/b  Additional Objective Labs: Basic Metabolic Panel: Recent Labs  Lab 06/12/23 1401 06/13/23 0459 06/15/23 1245  NA 133* 136 136  K 4.6 4.7 4.1  CL 99 101 99  CO2 19* 19* 22  GLUCOSE 228* 102* 106*  BUN 62* 66* 49*  CREATININE 7.86* 8.19* 7.74*  CALCIUM 8.4* 8.8* 8.1*  PHOS 8.0* 8.2* 7.3*   Liver Function Tests: Recent Labs  Lab 06/12/23 1401 06/13/23 0459 06/15/23 1245  ALBUMIN 2.1* 2.3* 2.0*   No results for input(s): "LIPASE", "AMYLASE" in the last 168 hours. CBC: Recent Labs  Lab 06/10/23 2136 06/15/23 1245  WBC 12.2* 9.4  HGB 8.4* 7.6*  HCT 27.7* 25.5*  MCV 99.6 98.5  PLT 320 354   Blood Culture    Component Value Date/Time   SDES BLOOD BLOOD RIGHT HAND 02/17/2023 2243   SPECREQUEST  02/17/2023 2243    BOTTLES DRAWN AEROBIC AND ANAEROBIC Blood Culture results may not be optimal due to an inadequate volume of blood received in culture bottles   CULT  02/17/2023 2243    NO GROWTH 5 DAYS Performed at [facility], [address], [place], [place] [address]    REPTSTATUS  02/22/2023 FINAL 02/17/2023 2243    Cardiac Enzymes: No results for input(s): "CKTOTAL", "CKMB", "CKMBINDEX", "TROPONINI" in the last 168 hours. CBG: Recent Labs  Lab 06/15/23 2138 06/16/23 0955 06/16/23 1152 06/16/23 1954 06/17/23 0746  GLUCAP 98 73 77 164* 151*   Iron Studies: No results for input(s): "IRON", "TIBC", "TRANSFERRIN", "FERRITIN" in the last 72 hours. @lablastinr3 @ Studies/Results: No results found. Medications:   (feeding supplement) PROSource Plus  30 mL Oral BID BM   acidophilus  1 capsule Oral TID WC   allopurinol  100 mg Oral BID   atorvastatin  10 mg Oral QPM   busPIRone  5 mg Oral TID   cholestyramine light  4 g Oral BID   darbepoetin (ARANESP) injection - DIALYSIS  200 mcg Subcutaneous Q Wed-1800   diclofenac Sodium  2 g Topical QID   DULoxetine  30 mg Oral Daily   famotidine  20 mg Oral QAC breakfast   ferric citrate  420 mg Oral TID WC   folic acid  1 mg Oral Daily   Gerhardt's butt cream   Topical BID   heparin  5,000 Units Subcutaneous Q8H   hyoscyamine  0.125 mg Sublingual TID   insulin aspart  0-6 Units Subcutaneous TID WC  loperamide  2 mg Oral Q6H   methocarbamol  500 mg Oral TID   metoprolol tartrate  12.5 mg Oral BID   midodrine  10 mg Oral Q M,W,F-HD   mirtazapine  15 mg Oral QHS   pantoprazole  40 mg Oral BID   promethazine  25 mg Oral TID AC & HS   risperiDONE  1 mg Oral QHS   sodium chloride flush  10-40 mL Intracatheter Q12H   traZODone  50 mg Oral QHS    Dialysis Orders: TTS - [facility] -> WILL BE MWF - [facility] ON D/C 4hr, 400/800, EDW 101.5kg, 3K/2.5Ca bath, TDC, no heparin - Mircera IV q 2 weeks - last given 3/15 - Hectoral IV q HD  Assessment/Plan: Gastroparesis + chronic diarrhea: Improving. Recent C.diff - negative here. ESRD: Usual TTS schedule - has refused HD at least 2X, so off schedule this admit. S/p HD 4/9. Will be on MWF schedule moving forward - accepted at new HD unit, pending SNF  insurance issues. Refused HD again today citing full body pain, agreeable to try for HD later today/tonight.  Vascular access: Refusing AVF use. VVS consulted - ok'd for use as of 4/4. TDC in place. L heel wound: Wound care following. HypoTN/volume: BP chronically soft. Uses midodrine pre-HD. Remains way above her prior EDW, but getting closer, 3.2L off with last HD. Anemia of ESRD: Hgb 7.6 - continue Aranesp q Wed while here. Secondary HPTH: Ca ok, Phos high but coming down with higher dose Auryxia. Nutrition: Albumin low, continue protein supps. T2DM: per admitting team A-fib: Not on AC d/t recent GIB Mood disorder/depression: Appreciate psych consult 4/7, was started on duloxetine.     Rogers Blocker, PA-C 06/17/2023, 9:10 AM  [facility] Pager: [phone]

## 2023-06-17 NOTE — Progress Notes (Addendum)
 Patient agreed to hemodialysis not at this but at 5pm.We would to get her tonight.

## 2023-06-17 NOTE — Progress Notes (Addendum)
 Advised by CSW that pt will d/c to snf today. Contacted Kim with [facility] out-pt HD to be advised pt will d/c to snf today and should start on Monday as planned. HD arrangements added to AVS as well. D/C summary and today's renal note faxed to Kim to provide to clinic for continuation of care.   Olivia Canter Renal Navigator [phone]  Addendum at 2:04 pm: Advised by CSW that pt will not be going to snf in [place]. Therefore, pt will not need clinic placement at [facility]. Per CSW, a local [place] snf can accept pt which will allow pt to return to her regular HD clinic ([place] T[facility] 12:10 chair time). Contacted Kim with [facility] to make her aware that pt will not need placement at [facility] at d/c. Contacted [facility] and left message requesting a return call to advise staff that pt should resume at d/c.   Addendum at 3:20 pm: Pt unable to d/c to snf until Monday. Contacted [facility] to be advised that pt will be going to snf in [place] and will hopefully resume on Tuesday. Will contact clinic on Monday to confirm plans. HD info added to AVS.

## 2023-06-18 DIAGNOSIS — K3184 Gastroparesis: Secondary | ICD-10-CM | POA: Diagnosis not present

## 2023-06-18 LAB — GLUCOSE, CAPILLARY
Glucose-Capillary: 130 mg/dL — ABNORMAL HIGH (ref 70–99)
Glucose-Capillary: 186 mg/dL — ABNORMAL HIGH (ref 70–99)
Glucose-Capillary: 174 mg/dL — ABNORMAL HIGH (ref 70–99)

## 2023-06-18 LAB — CBC
HCT: 25.2 % — ABNORMAL LOW (ref 36.0–46.0)
Hemoglobin: 7.5 g/dL — ABNORMAL LOW (ref 12.0–15.0)
MCH: 29.6 pg (ref 26.0–34.0)
MCHC: 29.8 g/dL — ABNORMAL LOW (ref 30.0–36.0)
MCV: 99.6 fL (ref 80.0–100.0)
Platelets: 303 10*3/uL (ref 150–400)
RBC: 2.53 MIL/uL — ABNORMAL LOW (ref 3.87–5.11)
RDW: 15.1 % (ref 11.5–15.5)
WBC: 9.4 10*3/uL (ref 4.0–10.5)
nRBC: 0 % (ref 0.0–0.2)

## 2023-06-18 LAB — RENAL FUNCTION PANEL
Albumin: 2.1 g/dL — ABNORMAL LOW (ref 3.5–5.0)
Anion gap: 12 (ref 5–15)
BUN: 33 mg/dL — ABNORMAL HIGH (ref 6–20)
CO2: 21 mmol/L — ABNORMAL LOW (ref 22–32)
Calcium: 8 mg/dL — ABNORMAL LOW (ref 8.9–10.3)
Chloride: 103 mmol/L (ref 98–111)
Creatinine, Ser: 6.61 mg/dL — ABNORMAL HIGH (ref 0.44–1.00)
GFR, Estimated: 7 mL/min — ABNORMAL LOW (ref >60)
Glucose, Bld: 146 mg/dL — ABNORMAL HIGH (ref 70–99)
Phosphorus: 6.8 mg/dL — ABNORMAL HIGH (ref 2.5–4.6)
Potassium: 3.2 mmol/L — ABNORMAL LOW (ref 3.5–5.1)
Sodium: 136 mmol/L (ref 135–145)

## 2023-06-18 MED ORDER — ACETAMINOPHEN 325 MG PO TABS
325.0000 mg | ORAL_TABLET | Freq: Two times a day (BID) | ORAL | Status: DC
  Administered 2023-06-18 – 2023-06-20 (×4): 325 mg via ORAL
  Filled 2023-06-18 (×4): qty 1

## 2023-06-18 MED ORDER — HEPARIN SODIUM (PORCINE) 1000 UNIT/ML IJ SOLN
3800.0000 [IU] | Freq: Once | INTRAMUSCULAR | Status: AC
  Administered 2023-06-18: 3800 [IU]

## 2023-06-18 MED ORDER — ALBUMIN HUMAN 25 % IV SOLN
25.0000 g | Freq: Once | INTRAVENOUS | Status: AC
  Administered 2023-06-18: 25 g via INTRAVENOUS
  Filled 2023-06-18: qty 100

## 2023-06-18 MED ORDER — HEPARIN SODIUM (PORCINE) 1000 UNIT/ML IJ SOLN
INTRAMUSCULAR | Status: AC
  Filled 2023-06-18: qty 4

## 2023-06-18 NOTE — Progress Notes (Signed)
 [facility] Progress Note   Subjective:   Underwent dialysis early this AM with some hypotension reported. Reports she is tired, otherwise doing well. Denies SOB, CP, dizziness, nausea.   Objective Vitals:   06/18/23 0910 06/18/23 0914 06/18/23 0930 06/18/23 0954  BP: 103/62 103/62  116/77  Pulse: 90 88  91  Resp: 17 (!) 23  18  Temp:  97.6 F (36.4 C)  98.2 F (36.8 C)  TempSrc:  Oral  Oral  SpO2: 100% 100%  100%  Weight:   113.6 kg   Height:       Physical Exam General: Alert female in NAD Heart: RRR, no murmurs, rubs or gallops Lungs: CTA bilaterally, respirations unlabored on RA Abdomen: +BS Extremities: R BKA, LLE 2+ edema Dialysis Access:  TDC in R chest, RUE AVF + T/b  Additional Objective Labs: Basic Metabolic Panel: Recent Labs  Lab 06/15/23 1245 06/17/23 1202 06/18/23 0500  NA 136 138 136  K 4.1 3.4* 3.2*  CL 99 100 103  CO2 22 22 21*  GLUCOSE 106* 210* 146*  BUN 49* 29* 33*  CREATININE 7.74* 6.28* 6.61*  CALCIUM 8.1* 8.3* 8.0*  PHOS 7.3* 6.3* 6.8*   Liver Function Tests: Recent Labs  Lab 06/15/23 1245 06/17/23 1202 06/18/23 0500  ALBUMIN 2.0* 2.4* 2.1*   No results for input(s): "LIPASE", "AMYLASE" in the last 168 hours. CBC: Recent Labs  Lab 06/15/23 1245 06/17/23 1202 06/18/23 0500  WBC 9.4 8.7 9.4  HGB 7.6* 8.2* 7.5*  HCT 25.5* 27.9* 25.2*  MCV 98.5 98.9 99.6  PLT 354 368 303   Blood Culture    Component Value Date/Time   SDES BLOOD BLOOD RIGHT HAND 02/17/2023 2243   SPECREQUEST  02/17/2023 2243    BOTTLES DRAWN AEROBIC AND ANAEROBIC Blood Culture results may not be optimal due to an inadequate volume of blood received in culture bottles   CULT  02/17/2023 2243    NO GROWTH 5 DAYS Performed at [facility], [address], [place], [place] [address]    REPTSTATUS 02/22/2023 FINAL 02/17/2023 2243    Cardiac Enzymes: No results for input(s): "CKTOTAL", "CKMB", "CKMBINDEX", "TROPONINI" in the last 168  hours. CBG: Recent Labs  Lab 06/16/23 1152 06/16/23 1954 06/17/23 0746 06/17/23 1136 06/17/23 1659  GLUCAP 77 164* 151* 234* 123*   Iron Studies: No results for input(s): "IRON", "TIBC", "TRANSFERRIN", "FERRITIN" in the last 72 hours. @lablastinr3 @ Studies/Results: No results found. Medications:   (feeding supplement) PROSource Plus  30 mL Oral BID BM   acidophilus  1 capsule Oral TID WC   allopurinol  100 mg Oral BID   atorvastatin  10 mg Oral QPM   busPIRone  5 mg Oral TID   cholestyramine light  4 g Oral BID   cloNIDine  0.1 mg Oral BID   darbepoetin (ARANESP) injection - DIALYSIS  200 mcg Subcutaneous Q Wed-1800   diclofenac Sodium  2 g Topical QID   DULoxetine  30 mg Oral Daily   famotidine  20 mg Oral QAC breakfast   ferric citrate  420 mg Oral TID WC   folic acid  1 mg Oral Daily   Gerhardt's butt cream   Topical BID   heparin  5,000 Units Subcutaneous Q8H   hyoscyamine  0.125 mg Sublingual TID   insulin aspart  0-6 Units Subcutaneous TID WC   loperamide  2 mg Oral Q6H   methocarbamol  500 mg Oral TID   metoprolol tartrate  12.5  mg Oral BID   midodrine  10 mg Oral Q M,W,F-HD   mirtazapine  15 mg Oral QHS   pantoprazole  40 mg Oral BID   promethazine  25 mg Oral TID AC & HS   risperiDONE  1 mg Oral QHS   sodium chloride flush  10-40 mL Intracatheter Q12H   traZODone  50 mg Oral QHS    Dialysis Orders: TTS - [facility] 4hr, 400/800, EDW 101.5kg, 3K/2.5Ca bath, TDC, no heparin - Mircera 225mcg IV q 2 weeks - last given 3/15 - Hectoral 4mcg IV q HD  Assessment/Plan: Gastroparesis + chronic diarrhea: Improving. Recent C.diff - negative here. ESRD: Usual TTS schedule but has refused HD intermittently due to pain. Appears plan is now to d/c to a SNF that would allow her to remain on TTS schedule at [facility]- back on schedule today. K+ running low, using 3/4K bath with HD Vascular access: Refusing AVF use. VVS consulted - ok'd for use as of 4/4.  TDC in place. L heel wound: Wound care following. HypoTN/volume: BP chronically soft. Uses midodrine pre-HD. Remains way above her prior EDW, but getting closer. Continue to encourage fluid restrictions Anemia of ESRD: Hgb 7.5 - continue Aranesp 200mcg q Wed while here. Secondary HPTH: Ca ok, Phos high but coming down with higher dose Auryxia. Nutrition: Albumin low, continue protein supps. T2DM: per admitting team A-fib: Not on AC d/t recent GIB Mood disorder/depression: Appreciate psych consult 4/7, was started on duloxetine.  Ramona Burner, PA-C 06/18/2023, 11:18 AM  [facility] Pager: [phone]

## 2023-06-18 NOTE — Progress Notes (Signed)
   06/18/23 0914  Vitals  Temp 97.6 F (36.4 C)  Temp Source Oral  BP 103/62  MAP (mmHg) 70  Pulse Rate 88  ECG Heart Rate 90  Resp (!) 23  Oxygen Therapy  SpO2 100 %  O2 Device Room Air  During Treatment Monitoring  Dialysis Fluid Bolus Normal Saline  Bolus Amount (mL) 100 mL  Post Treatment  Dialyzer Clearance Lightly streaked  Liters Processed 96  Fluid Removed (mL) 3900 mL  Tolerated HD Treatment Yes  Post-Hemodialysis Comments pt has some hypotension during session.

## 2023-06-18 NOTE — Progress Notes (Signed)
 Received patient in bed to unit.  Alert and oriented.  Informed consent signed and in chart.   TX duration: 4 hours  Patient tolerated well.  Transported back to the room  Alert, without acute distress.  Hand-off given to patient's nurse.   Access used: R HD HD Cath Access issues: none  Total UF removed: 3.9L Medication(s) given: 100 cc bolus, midodrine and albumin   Luciano Ruths LPN [facility]

## 2023-06-18 NOTE — Progress Notes (Signed)
 Deborah [name]  VOZ:[id number] DOB: 1966/02/10 DOA: 05/29/2023 PCP: Pcp, No    Brief Narrative:  58 year old with a history of ESRD on HD previously TTS, chronic gastroparesis with GERD, PAF not on AC, diastolic CHF, gout, duodenal carcinoid tumor, PVD status post right BKA, gastric AVMs, NASH, depression/anxiety, obesity, restrictive lung disease, spinal stenosis with chronic pain syndrome, DM2, chronic left heel diabetic ulcer, and chronic diarrhea related to carcinoid tumor who presented to the ED with complaints of worsening diarrhea and abdominal pain in the setting of outpatient diagnosed C. difficile.  Goals of Care:   Code Status: Full Code   DVT prophylaxis: heparin injection 5,000 Units Start: 05/29/23 1400   Interim Hx: The patient's planned discharge to SNF was canceled yesterday after first she refused a.m. dialysis, and subsequently her SNF rescinded her bed offer.  A new SNF bed was not able to be identified in time for her to be discharged as planned 4/11.  She was rescheduled for the afternoon dialysis block and completed her dialysis earlier this morning.  She is afebrile with stable vital signs.  After a long discussion with her yesterday regarding her pain control she agreed to a trial of Tylenol for potentiation of her high-dose oral Dilaudid.  She is sitting up at the bedside at the time of my visit.  She is in better spirits today and appears comfortable though she tells me she still has severely uncontrolled pain.  She denies shortness of breath or chest pain.  Assessment & Plan:  Chronic gastroparesis Symptoms have stabilized to her baseline - patient tolerating oral intake - continue supportive medical therapy and antiemetics -I would like to minimize narcotic use as I fear this is likely worsening her gastroparesis but at present her reported pain does not make this possible  Chronic pain syndrome - chronic abdominal pain Pain medications have been titrated  during this hospital stay -transitioned to oral hydromorphone 4/9 with patient making repeated request for IV hydromorphone -oral pain medications have been adjusted but I am avoiding resumption of IV pain medications with the hope of obtaining more consistent longer-term control with oral pain medications -I have counseled her at length on the fact that oral medications will provide her more consistent longer relief -given the complexity of her situation and compounding factors to include depression as well as a strong insistence for IV dosing which suggests drug-seeking I have asked Palliative Care to see her to provide a second opinion on the most appropriate pain control regimen for this difficult situation  Chronic diarrhea with recent history of C. difficile C. difficile negative this admission -clinically stable at this time  Malignant carcinoid tumor of the duodenum Status post excision -continues to have intermittent diarrhea which is controlled with Lomotil as needed, as per her baseline  Anxiety/depression Continue Remeron, BuSpar, and hydroxyzine - follow-up with Psychiatry as an outpatient  ESRD on HD HD per nephrology with patient now back on a TTS schedule  Anemia of chronic kidney disease Erythropoietin per nephrology -hemoglobin stable  Acute exacerbation on chronic diastolic CHF Patient has been noncompliant with renal diet and outpatient HD -exacerbation resolved with inpatient HD management  Paroxysmal atrial fibrillation Continue metoprolol -not on anticoagulation due to history of GI bleeding 2024 -rate controlled  DM 2 CBG well-controlled during this admission  Gout Continue allopurinol -no evidence of acute flare at this time  Chronic wound of left heel  Morbid obesity - Body mass index is 40.42 kg/m (pended).  Family Communication: Spoke with mother at bedside Disposition: Medically stable for discharge to SNF - hope new SNF bed can be procured by  4/14   Objective: Blood pressure 116/77, pulse 91, temperature 98.2 F (36.8 C), temperature source Oral, resp. rate 18, height (P) 5\' 6"  (1.676 m), weight 113.6 kg, last menstrual period 10/10/2012, SpO2 100%.  Intake/Output Summary (Last 24 hours) at 06/18/2023 1154 Last data filed at 06/18/2023 0914 Gross per 24 hour  Intake 642 ml  Output 7800 ml  Net -7158 ml   Filed Weights   06/16/23 0552 06/18/23 0456 06/18/23 0930  Weight: 118.9 kg 117.5 kg 113.6 kg    Examination: General: No acute respiratory distress -alert and conversant Lungs: Clear to auscultation bilaterally Cardiovascular: Regular rate and rhythm without murmur  Abdomen: Nontender, nondistended, soft, bowel sounds positive, no rebound Extremities: No significant edema lower extremities  CBC: Recent Labs  Lab 06/15/23 1245 06/17/23 1202 06/18/23 0500  WBC 9.4 8.7 9.4  HGB 7.6* 8.2* 7.5*  HCT 25.5* 27.9* 25.2*  MCV 98.5 98.9 99.6  PLT 354 368 303   Basic Metabolic Panel: Recent Labs  Lab 06/15/23 1245 06/17/23 1202 06/18/23 0500  NA 136 138 136  K 4.1 3.4* 3.2*  CL 99 100 103  CO2 22 22 21*  GLUCOSE 106* 210* 146*  BUN 49* 29* 33*  CREATININE 7.74* 6.28* 6.61*  CALCIUM 8.1* 8.3* 8.0*  PHOS 7.3* 6.3* 6.8*   GFR: Estimated Creatinine Clearance: 11.7 mL/min (A) (by C-G formula based on SCr of 6.61 mg/dL (H)).   Scheduled Meds:  (feeding supplement) PROSource Plus  30 mL Oral BID BM   acidophilus  1 capsule Oral TID WC   allopurinol  100 mg Oral BID   atorvastatin  10 mg Oral QPM   busPIRone  5 mg Oral TID   cholestyramine light  4 g Oral BID   cloNIDine  0.1 mg Oral BID   darbepoetin (ARANESP) injection - DIALYSIS  200 mcg Subcutaneous Q Wed-1800   diclofenac Sodium  2 g Topical QID   DULoxetine  30 mg Oral Daily   famotidine  20 mg Oral QAC breakfast   ferric citrate  420 mg Oral TID WC   folic acid  1 mg Oral Daily   Gerhardt's butt cream   Topical BID   heparin  5,000 Units  Subcutaneous Q8H   hyoscyamine  0.125 mg Sublingual TID   insulin aspart  0-6 Units Subcutaneous TID WC   loperamide  2 mg Oral Q6H   methocarbamol  500 mg Oral TID   metoprolol tartrate  12.5 mg Oral BID   midodrine  10 mg Oral Q M,W,F-HD   mirtazapine  15 mg Oral QHS   pantoprazole  40 mg Oral BID   promethazine  25 mg Oral TID AC & HS   risperiDONE  1 mg Oral QHS   sodium chloride flush  10-40 mL Intracatheter Q12H   traZODone  50 mg Oral QHS     LOS: 19 days   Abbe Abate, MD [facility] Office  [phone] Pager - Text Page per Deborah [name]  If 7PM-7AM, please contact night-coverage per Amion 06/18/2023, 11:54 AM

## 2023-06-19 DIAGNOSIS — K3184 Gastroparesis: Secondary | ICD-10-CM | POA: Diagnosis not present

## 2023-06-19 LAB — GLUCOSE, CAPILLARY
Glucose-Capillary: 137 mg/dL — ABNORMAL HIGH (ref 70–99)
Glucose-Capillary: 87 mg/dL (ref 70–99)
Glucose-Capillary: 103 mg/dL — ABNORMAL HIGH (ref 70–99)
Glucose-Capillary: 163 mg/dL — ABNORMAL HIGH (ref 70–99)

## 2023-06-19 NOTE — Progress Notes (Signed)
 [facility] Progress Note   Subjective:   Seen in room, sleeping in bed. When awoken she reports she is in a lot of pain. Reports shortness of breath earlier this AM.   Objective Vitals:   06/18/23 2022 06/19/23 0531 06/19/23 0543 06/19/23 0824  BP: 122/76 (!) 102/51  (!) 99/54  Pulse: 95 93  82  Resp: 18 17  18   Temp: 97.7 F (36.5 C) 98.1 F (36.7 C)  98.5 F (36.9 C)  TempSrc: Oral Oral    SpO2: 100% 100%  100%  Weight:   113.6 kg   Height:       Physical Exam General: chronically ill appearing female in NAD Heart: RRR, no murmurs, rubs or gallops Lungs: CTA bilaterally, respirations unlabored on RA Abdomen: +BS Extremities: R BKA, LLE 2+ edema Dialysis Access:  TDC in R chest, RUE AVF + T/b  Additional Objective Labs: Basic Metabolic Panel: Recent Labs  Lab 06/15/23 1245 06/17/23 1202 06/18/23 0500  NA 136 138 136  K 4.1 3.4* 3.2*  CL 99 100 103  CO2 22 22 21*  GLUCOSE 106* 210* 146*  BUN 49* 29* 33*  CREATININE 7.74* 6.28* 6.61*  CALCIUM 8.1* 8.3* 8.0*  PHOS 7.3* 6.3* 6.8*   Liver Function Tests: Recent Labs  Lab 06/15/23 1245 06/17/23 1202 06/18/23 0500  ALBUMIN 2.0* 2.4* 2.1*   No results for input(s): "LIPASE", "AMYLASE" in the last 168 hours. CBC: Recent Labs  Lab 06/15/23 1245 06/17/23 1202 06/18/23 0500  WBC 9.4 8.7 9.4  HGB 7.6* 8.2* 7.5*  HCT 25.5* 27.9* 25.2*  MCV 98.5 98.9 99.6  PLT 354 368 303   Blood Culture    Component Value Date/Time   SDES BLOOD BLOOD RIGHT HAND 02/17/2023 2243   SPECREQUEST  02/17/2023 2243    BOTTLES DRAWN AEROBIC AND ANAEROBIC Blood Culture results may not be optimal due to an inadequate volume of blood received in culture bottles   CULT  02/17/2023 2243    NO GROWTH 5 DAYS Performed at [facility], [address] [place], [place] [address]    REPTSTATUS 02/22/2023 FINAL 02/17/2023 2243    Cardiac Enzymes: No results for input(s): "CKTOTAL", "CKMB", "CKMBINDEX",  "TROPONINI" in the last 168 hours. CBG: Recent Labs  Lab 06/17/23 1659 06/18/23 1131 06/18/23 1614 06/18/23 2142 06/19/23 0734  GLUCAP 123* 130* 186* 174* 137*   Iron Studies: No results for input(s): "IRON", "TIBC", "TRANSFERRIN", "FERRITIN" in the last 72 hours. @lablastinr3 @ Studies/Results: No results found. Medications:   (feeding supplement) PROSource Plus  30 mL Oral BID BM   acetaminophen  325 mg Oral BID   acidophilus  1 capsule Oral TID WC   allopurinol  100 mg Oral BID   atorvastatin  10 mg Oral QPM   busPIRone  5 mg Oral TID   cholestyramine light  4 g Oral BID   cloNIDine  0.1 mg Oral BID   darbepoetin (ARANESP) injection - DIALYSIS  200 mcg Subcutaneous Q Wed-1800   diclofenac Sodium  2 g Topical QID   DULoxetine  30 mg Oral Daily   famotidine  20 mg Oral QAC breakfast   ferric citrate  420 mg Oral TID WC   folic acid  1 mg Oral Daily   Gerhardt's butt cream   Topical BID   heparin  5,000 Units Subcutaneous Q8H   hyoscyamine  0.125 mg Sublingual TID   insulin aspart  0-6 Units Subcutaneous TID WC   loperamide  2  mg Oral Q6H   methocarbamol  500 mg Oral TID   metoprolol tartrate  12.5 mg Oral BID   midodrine  10 mg Oral Q M,W,F-HD   mirtazapine  15 mg Oral QHS   pantoprazole  40 mg Oral BID   promethazine  25 mg Oral TID AC & HS   risperiDONE  1 mg Oral QHS   sodium chloride flush  10-40 mL Intracatheter Q12H   traZODone  50 mg Oral QHS    Dialysis Orders: TTS - [facility] 4hr, 400/800, EDW 101.5kg, 3K/2.5Ca bath, TDC, no heparin - Mircera 225mcg IV q 2 weeks - last given 3/15 - Hectoral 4mcg IV q HD  Assessment/Plan: Gastroparesis + chronic diarrhea: Improving. Recent C.diff - negative here. ESRD: Usual TTS schedule but has refused HD intermittently due to pain. Appears plan is now to d/c to a SNF that would allow her to remain on TTS schedule at [facility]- back on schedule today. K+ running low, using 3/4K bath with HD Vascular  access: Refusing AVF use. VVS consulted - ok'd for use as of 4/4. TDC in place. L heel wound: Wound care following. HypoTN/volume: BP chronically soft. Uses midodrine pre-HD. Remains way above her prior EDW, but getting closer. Continue to encourage fluid restrictions Anemia of ESRD: Hgb 7.5 - continue Aranesp 200mcg q Wed while here. Secondary HPTH: Ca ok, Phos high but coming down with higher dose Auryxia. Nutrition: Albumin low, continue protein supps. T2DM: per admitting team A-fib: Not on AC d/t recent GIB Mood disorder/depression: Appreciate psych consult 4/7, was started on duloxetine.  Deborah Burner, PA-C 06/19/2023, 9:26 AM  [facility] Pager: [phone]

## 2023-06-19 NOTE — Progress Notes (Signed)
      Consult received. Chart reviewed. Discussed with palliative physician Dr. Azalea Lento and primary hospitalist Dr. Jonelle Neri.   After reviewing the case, it appears that chronic pain management for this patient falls outside the scope of PMT services.  Per review of epic progress notes and [name], it appears patient's chronic pain has previously been managed by [facility]. Recommend that patient follow-up with this practice after discharge.     Maisie Scotland, NP-C Palliative Medicine   Please call Palliative Medicine team phone with any questions [phone]. For individual providers please see AMION.   No charge

## 2023-06-19 NOTE — Plan of Care (Signed)
   Problem: Fluid Volume: Goal: Ability to maintain a balanced intake and output will improve Outcome: Progressing   Problem: Nutritional: Goal: Maintenance of adequate nutrition will improve Outcome: Progressing

## 2023-06-19 NOTE — Plan of Care (Signed)
  Problem: Fluid Volume: Goal: Ability to maintain a balanced intake and output will improve Outcome: Completed/Met

## 2023-06-19 NOTE — Progress Notes (Signed)
 Deborah [name]  ZOX:[id number] DOB: 1966-01-29 DOA: 05/29/2023 PCP: Pcp, No    Brief Narrative:  58 year old with a history of ESRD on HD previously TTS, chronic gastroparesis with GERD, PAF not on AC, diastolic CHF, gout, duodenal carcinoid tumor, PVD status post right BKA, gastric AVMs, NASH, depression/anxiety, obesity, restrictive lung disease, spinal stenosis with chronic pain syndrome, DM2, chronic left heel diabetic ulcer, and chronic diarrhea related to carcinoid tumor who presented to the ED with complaints of worsening diarrhea and abdominal pain in the setting of outpatient diagnosed C. difficile.  Goals of Care:   Code Status: Full Code   DVT prophylaxis: heparin injection 5,000 Units Start: 05/29/23 1400   Interim Hx: No acute events reported overnight.  Afebrile.  Vital signs stable.  CBG controlled.  Sleeping soundly in bed.  Awakens to my voice.  Denies any new complaints at this time.  Appears clinically stable.  Assessment & Plan:  Chronic gastroparesis Symptoms have stabilized to her baseline - patient tolerating oral intake - continue supportive medical therapy and antiemetics -I would like to minimize narcotic use as I fear this is likely worsening her gastroparesis but at present her reported pain does not make this possible  Chronic pain syndrome - chronic abdominal pain Pain medications have been titrated during this hospital stay - transitioned to oral hydromorphone 4/9 with patient making repeated request for IV hydromorphone -oral pain medications have been adjusted but I am avoiding resumption of IV pain medications with the hope of obtaining more consistent longer-term control with oral pain medications - I have counseled her at length on the fact that oral medications will provide her more consistent longer relief - given the complexity of her situation and compounding factors to include depression as well as a strong insistence for IV dosing which suggests  drug-seeking I have asked Palliative Care to see her to provide a second opinion on the most appropriate pain control regimen for this difficult situation  Chronic diarrhea with recent history of C. difficile C. difficile negative this admission - clinically stable at this time with no clinical features to suggest an infectious colitis  Malignant carcinoid tumor of the duodenum Status post excision - continues to have intermittent diarrhea which is controlled with Lomotil as needed, as per her baseline  Anxiety/depression Continue Remeron, BuSpar, and hydroxyzine - follow-up with Psychiatry as an outpatient  ESRD on HD HD per nephrology with patient now back on a TTS schedule  Anemia of chronic kidney disease Erythropoietin per nephrology -hemoglobin stable  Acute exacerbation on chronic diastolic CHF Patient had been noncompliant with renal diet as an outpatient, and outpatient HD - exacerbation resolved with inpatient HD management  Paroxysmal atrial fibrillation Continue metoprolol - not on anticoagulation due to history of GI bleeding 2024 - rate controlled  DM 2 CBG well-controlled during this admission  Gout Continue allopurinol -no evidence of acute flare at this time  Chronic wound of left heel  Morbid obesity - Body mass index is 40.42 kg/m (pended).    Family Communication: Spoke with mother at bedside Disposition: Medically stable for discharge to SNF - hope new SNF bed can be procured by 4/14   Objective: Blood pressure (!) 99/54, pulse 82, temperature 98.5 F (36.9 C), resp. rate 18, height (P) 5\' 6"  (1.676 m), weight 113.6 kg, last menstrual period 10/10/2012, SpO2 100%.  Intake/Output Summary (Last 24 hours) at 06/19/2023 0853 Last data filed at 06/18/2023 0914 Gross per 24 hour  Intake --  Output 7800 ml  Net -7800 ml   Filed Weights   06/18/23 0456 06/18/23 0930 06/19/23 0543  Weight: 117.5 kg 113.6 kg 113.6 kg    Examination: General: No acute  respiratory distress  Lungs: Clear to auscultation bilaterally Cardiovascular: RRR Abdomen: Nontender, nondistended, soft, bowel sounds positive, no rebound Extremities: No significant edema lower extremities  CBC: Recent Labs  Lab 06/15/23 1245 06/17/23 1202 06/18/23 0500  WBC 9.4 8.7 9.4  HGB 7.6* 8.2* 7.5*  HCT 25.5* 27.9* 25.2*  MCV 98.5 98.9 99.6  PLT 354 368 303   Basic Metabolic Panel: Recent Labs  Lab 06/15/23 1245 06/17/23 1202 06/18/23 0500  NA 136 138 136  K 4.1 3.4* 3.2*  CL 99 100 103  CO2 22 22 21*  GLUCOSE 106* 210* 146*  BUN 49* 29* 33*  CREATININE 7.74* 6.28* 6.61*  CALCIUM 8.1* 8.3* 8.0*  PHOS 7.3* 6.3* 6.8*   GFR: Estimated Creatinine Clearance: 11.7 mL/min (A) (by C-G formula based on SCr of 6.61 mg/dL (H)).   Scheduled Meds:  (feeding supplement) PROSource Plus  30 mL Oral BID BM   acetaminophen  325 mg Oral BID   acidophilus  1 capsule Oral TID WC   allopurinol  100 mg Oral BID   atorvastatin  10 mg Oral QPM   busPIRone  5 mg Oral TID   cholestyramine light  4 g Oral BID   cloNIDine  0.1 mg Oral BID   darbepoetin (ARANESP) injection - DIALYSIS  200 mcg Subcutaneous Q Wed-1800   diclofenac Sodium  2 g Topical QID   DULoxetine  30 mg Oral Daily   famotidine  20 mg Oral QAC breakfast   ferric citrate  420 mg Oral TID WC   folic acid  1 mg Oral Daily   Gerhardt's butt cream   Topical BID   heparin  5,000 Units Subcutaneous Q8H   hyoscyamine  0.125 mg Sublingual TID   insulin aspart  0-6 Units Subcutaneous TID WC   loperamide  2 mg Oral Q6H   methocarbamol  500 mg Oral TID   metoprolol tartrate  12.5 mg Oral BID   midodrine  10 mg Oral Q M,W,F-HD   mirtazapine  15 mg Oral QHS   pantoprazole  40 mg Oral BID   promethazine  25 mg Oral TID AC & HS   risperiDONE  1 mg Oral QHS   sodium chloride flush  10-40 mL Intracatheter Q12H   traZODone  50 mg Oral QHS     LOS: 20 days   Abbe Abate, MD [facility] Office   [phone] Pager - Text Page per Tilford Foley  If 7PM-7AM, please contact night-coverage per Amion 06/19/2023, 8:53 AM

## 2023-06-20 DIAGNOSIS — K3184 Gastroparesis: Secondary | ICD-10-CM | POA: Diagnosis not present

## 2023-06-20 LAB — CBC
HCT: 28 % — ABNORMAL LOW (ref 36.0–46.0)
Hemoglobin: 8.3 g/dL — ABNORMAL LOW (ref 12.0–15.0)
MCH: 29.4 pg (ref 26.0–34.0)
MCHC: 29.6 g/dL — ABNORMAL LOW (ref 30.0–36.0)
MCV: 99.3 fL (ref 80.0–100.0)
Platelets: 268 10*3/uL (ref 150–400)
RBC: 2.82 MIL/uL — ABNORMAL LOW (ref 3.87–5.11)
RDW: 15.1 % (ref 11.5–15.5)
WBC: 8.7 10*3/uL (ref 4.0–10.5)
nRBC: 0 % (ref 0.0–0.2)

## 2023-06-20 LAB — GLUCOSE, CAPILLARY
Glucose-Capillary: 110 mg/dL — ABNORMAL HIGH (ref 70–99)
Glucose-Capillary: 148 mg/dL — ABNORMAL HIGH (ref 70–99)

## 2023-06-20 LAB — RENAL FUNCTION PANEL
Albumin: 2.5 g/dL — ABNORMAL LOW (ref 3.5–5.0)
Anion gap: 12 (ref 5–15)
BUN: 23 mg/dL — ABNORMAL HIGH (ref 6–20)
CO2: 23 mmol/L (ref 22–32)
Calcium: 8.4 mg/dL — ABNORMAL LOW (ref 8.9–10.3)
Chloride: 102 mmol/L (ref 98–111)
Creatinine, Ser: 5.83 mg/dL — ABNORMAL HIGH (ref 0.44–1.00)
GFR, Estimated: 8 mL/min — ABNORMAL LOW (ref >60)
Glucose, Bld: 124 mg/dL — ABNORMAL HIGH (ref 70–99)
Phosphorus: 6.1 mg/dL — ABNORMAL HIGH (ref 2.5–4.6)
Potassium: 3.8 mmol/L (ref 3.5–5.1)
Sodium: 137 mmol/L (ref 135–145)

## 2023-06-20 MED ORDER — ALTEPLASE 2 MG IJ SOLR: 2.0000 mg | Freq: Once | INTRAMUSCULAR | Status: DC | PRN

## 2023-06-20 MED ORDER — ACETAMINOPHEN 325 MG PO TABS: 325.0000 mg | ORAL_TABLET | Freq: Two times a day (BID) | ORAL | Status: DC

## 2023-06-20 MED ORDER — ANTICOAGULANT SODIUM CITRATE 4% (200MG/5ML) IV SOLN: 5.0000 mL | Status: DC | PRN

## 2023-06-20 MED ORDER — LIDOCAINE HCL (PF) 1 % IJ SOLN: 5.0000 mL | INTRAMUSCULAR | Status: DC | PRN

## 2023-06-20 MED ORDER — PENTAFLUOROPROP-TETRAFLUOROETH EX AERO: 1.0000 | INHALATION_SPRAY | CUTANEOUS | Status: DC | PRN

## 2023-06-20 MED ORDER — LIDOCAINE-PRILOCAINE 2.5-2.5 % EX CREA: 1.0000 | TOPICAL_CREAM | CUTANEOUS | Status: DC | PRN

## 2023-06-20 MED ORDER — HYDROMORPHONE HCL 4 MG PO TABS: 4.0000 mg | ORAL_TABLET | Freq: Four times a day (QID) | ORAL | 0 refills | Status: DC | PRN

## 2023-06-20 MED ORDER — HEPARIN SODIUM (PORCINE) 1000 UNIT/ML DIALYSIS: 1000.0000 [IU] | INTRAMUSCULAR | Status: DC | PRN

## 2023-06-20 NOTE — Progress Notes (Signed)
 Mobility Specialist Progress Note:    06/20/23 0800  Mobility  Activity Dangled on edge of bed  Level of Assistance Standby assist, set-up cues, supervision of patient - no hands on  Assistive Device None  Range of Motion/Exercises Active;All extremities  Activity Response Tolerated well  Mobility Referral Yes  Mobility visit 1 Mobility  Mobility Specialist Start Time (ACUTE ONLY) [id number]  Mobility Specialist Stop Time (ACUTE ONLY) [id number]  Mobility Specialist Time Calculation (min) (ACUTE ONLY) 4 min   Pt received sitting EOB and agreeable. Decline transfer to chair at the moment. Able to perform BUE & BLE ROM. No complaints throughout. Pt left on EOB with call bell and RN present.  D'Vante Nolon Baxter Mobility Specialist Please contact via Special educational needs teacher or Rehab office at [phone]

## 2023-06-20 NOTE — Progress Notes (Signed)
 [facility] Progress Note  Subjective:  Seen in room In good spirits No c/o today  Vitals:   06/19/23 0824 06/19/23 1656 06/20/23 0548 06/20/23 0701  BP: (!) 99/54 137/76 109/62 (!) 110/54  Pulse: 82 85 81 75  Resp: 18 18 18 18   Temp: 98.5 F (36.9 C) 98.2 F (36.8 C) 98.6 F (37 C) (!) 97.5 F (36.4 C)  TempSrc:  Oral  Oral  SpO2: 100% 100% 100% 100%  Weight:    114.5 kg  Height:        Exam: General: AAF in NAD, sitting on side of bed, alert, Ox 3 Heart: RRR, no murmurs, rubs or gallops Lungs: CTA bilaterally, respirations unlabored on RA Abdomen: +BS, nontender Extremities: R BKA, LLE 1-2+ edema Dialysis Access:  TDC in R chest, RUE AVF +     OP HD: TTS [facility] 4h  B400  101.5kg  3K bath  TDC  Heparin none - Mircera IV q 2 weeks - last given 3/15 - Hectoral IV q HD   Assessment/Plan: Gastroparesis + chronic diarrhea: Improving. Recent C.diff - negative here. ESRD: TTS HD. Appears plan is now to d/c to a SNF that would allow her to remain on TTS schedule at [facility]. HD tomorrow.  Vascular access: Refusing AVF use. VVS consulted - ok'd for use as of 4/4. TDC in place. L heel wound: Wound care following. HypoTN/volume: BP chronically soft. Uses midodrine pre-HD. Remains above the EDW, but getting closer. Continue to encourage fluid restrictions Anemia of ESRD: Hgb 7.5 - continue Aranesp q Wed while here. Secondary HPTH: Ca ok, Phos high but coming down with higher dose Auryxia. Nutrition: Albumin low, continue protein supps. T2DM: per admitting team A-fib: Not on AC d/t recent GIB Mood disorder/depression: Appreciate psych consult 4/7, was started on duloxetine   Rob Arlean Hopping MD  [facility] 06/20/2023, 12:56 PM  Recent Labs  Lab 06/18/23 0500 06/20/23 0933  HGB 7.5* 8.3*  ALBUMIN 2.1* 2.5*  CALCIUM 8.0* 8.4*  PHOS 6.8* 6.1*  CREATININE 6.61* 5.83*  K 3.2* 3.8   No results for input(s): "IRON", "TIBC", "FERRITIN" in  the last 168 hours. Inpatient medications:  (feeding supplement) PROSource Plus  30 mL Oral BID BM   acetaminophen  325 mg Oral BID   acidophilus  1 capsule Oral TID WC   allopurinol  100 mg Oral BID   atorvastatin  10 mg Oral QPM   busPIRone  5 mg Oral TID   cholestyramine light  4 g Oral BID   cloNIDine  0.1 mg Oral BID   darbepoetin (ARANESP) injection - DIALYSIS  200 mcg Subcutaneous Q Wed-1800   diclofenac Sodium  2 g Topical QID   DULoxetine  30 mg Oral Daily   famotidine  20 mg Oral QAC breakfast   ferric citrate  420 mg Oral TID WC   folic acid  1 mg Oral Daily   Gerhardt's butt cream   Topical BID   heparin  5,000 Units Subcutaneous Q8H   hyoscyamine  0.125 mg Sublingual TID   insulin aspart  0-6 Units Subcutaneous TID WC   loperamide  2 mg Oral Q6H   methocarbamol  500 mg Oral TID   metoprolol tartrate  12.5 mg Oral BID   midodrine  10 mg Oral Q M,W,F-HD   mirtazapine  15 mg Oral QHS   pantoprazole  40 mg Oral BID   promethazine  25 mg Oral TID AC & HS  risperiDONE  1 mg Oral QHS   sodium chloride flush  10-40 mL Intracatheter Q12H   traZODone  50 mg Oral QHS    anticoagulant sodium citrate     alteplase, anticoagulant sodium citrate, calcium carbonate, heparin, [EXPIRED]  HYDROmorphone (DILAUDID) injection **FOLLOWED BY** HYDROmorphone, hydrOXYzine, lidocaine (PF), lidocaine-prilocaine, nitroGLYCERIN, ondansetron (ZOFRAN) IV, mouth rinse, pentafluoroprop-tetrafluoroeth, sodium chloride flush

## 2023-06-20 NOTE — Progress Notes (Signed)
 DISCHARGE NOTE HOME Deborah [name] to be discharged Skilled nursing facility per MD order. Discussed prescriptions and follow up appointments with the patient. Prescriptions given to patient; medication list explained in detail. Patient verbalized understanding.  Skin clean, dry and intact without evidence of skin break down, no evidence of skin tears noted. IV catheter discontinued intact. Site without signs and symptoms of complications. Dressing and pressure applied. Pt denies pain at the site currently. No complaints noted.  Patient free of lines, drains, and wounds.   An After Visit Summary (AVS) was printed and given to [facility] staff Patient escorted via wheelchair, and discharged home via [facility] Elvina Hammers, RN

## 2023-06-20 NOTE — Discharge Summary (Signed)
 DISCHARGE SUMMARY  [name]  MR#: [id number]  DOB:[birth year]  Date of Admission: 05/29/2023 Date of Discharge: 06/20/2023  Attending Physician:Deborah Dodgen Silvestre Gunner, MD  Patient's PCP:Pcp, No  Disposition: D/C to SNF for rehab stay   Follow-up Appts:  Contact information for follow-up providers     Deborah Mustard, MD Follow up in 2 week(s).   Specialty: Orthopedic Surgery Contact information: [address] [place] [address] [phone]         [facility] Go on 06/21/2023.   Why: Schedule is Tuesday, Thursday, Saturday with 12:10 pm chair time. Contact information: [address] [place] [address] [phone]         [facility] Follow up.   Why: Follow up at the [facility] Contact information: [address] [place] [address] [phone]              Contact information for after-discharge care     Destination     [facility] .   Service: Skilled Nursing Contact information: [address] [place] [address] [phone]                     Discharge Diagnoses: Chronic gastroparesis Chronic pain syndrome - chronic abdominal pain - chronic diffuse joint pain  Chronic diarrhea with recent history of C. difficile Malignant carcinoid tumor of the duodenum Anxiety/depression ESRD on HD Anemia of chronic kidney disease Acute exacerbation on chronic diastolic CHF Paroxysmal atrial fibrillation DM 2 Gout Chronic wound of left heel Morbid obesity - Body mass index is 40.74 kg/m (pended).  Initial presentation: 58 year old with a history of ESRD on HD previously TTS, chronic gastroparesis with GERD, PAF not on AC, diastolic CHF, gout, duodenal carcinoid tumor, PVD status post right BKA, gastric AVMs, NASH, depression/anxiety, obesity, restrictive lung disease, spinal stenosis with chronic pain syndrome, DM2, chronic  left heel diabetic ulcer, and chronic diarrhea related to carcinoid tumor who presented to the ED with complaints of worsening diarrhea and abdominal pain in the setting of outpatient diagnosed C. difficile.   Hospital Course:  Chronic gastroparesis Symptoms have stabilized to her baseline - patient tolerating oral intake - continue supportive medical therapy and antiemetics -I would like to minimize narcotic use as I fear this is likely worsening her gastroparesis but at present her reported pain does not make this possible   Chronic pain syndrome - chronic abdominal pain - chronic diffuse joint pain  Pain medications have been titrated during this hospital stay - transitioned to oral hydromorphone 4/9 with patient making repeated requests for IV hydromorphone - oral pain medications have been adjusted but I am avoiding resumption of IV pain medications with the hope of obtaining more consistent longer-term control with oral pain medications - I have counseled her at length on the fact that oral medications will provide her more consistent and longer relief - Palliative Care has noted that she is followed at [facility], and has suggested f/u at that facility for adjustments in her chronic narcotic regimen -she was fired from the [facility] in January of this year when a drug screen was positive for both marijuana and cocaine - at that time she was being prescribed Dilaudid 4 mg every 8 hours as needed   Chronic diarrhea with recent history of C. difficile C. difficile negative this admission - clinically stable at this time with no clinical features to suggest  an infectious colitis   Malignant carcinoid tumor of the duodenum Status post excision - continues to have intermittent diarrhea which is controlled with Lomotil as needed, as per her baseline   Anxiety/depression Continue Remeron, BuSpar, and hydroxyzine - follow-up with Psychiatry as an outpatient    ESRD on HD HD per Nephrology with patient now back on a TTS schedule   Anemia of chronic kidney disease Erythropoietin per Nephrology - hemoglobin stable   Acute exacerbation on chronic diastolic CHF Patient had been noncompliant with renal diet as an outpatient, and outpatient HD - exacerbation resolved with inpatient HD management   Paroxysmal atrial fibrillation Continue metoprolol - not on anticoagulation due to history of GI bleeding 2024 - rate controlled   DM 2 CBG well-controlled during this admission   Gout Continue allopurinol -no evidence of acute flare at this time   Chronic wound of left heel   Morbid obesity - Body mass index is 40.74 kg/m (pended).  Allergies as of 06/20/2023       Reactions   Gabapentin Hives, [name]   Isovue [iopamidol] Anaphylaxis, [name], Other (See Comments)   11/28/17 Patient had seizure like activity and then 1 min code after 100 cc of isovue 300. Possible contrast allergy vs vasovagal episode Cardiac Arrest   Nsaids Anaphylaxis, Other (See Comments)   Hx of stomach ulcers   Penicillins Itching, Palpitations, Other (See Comments)   Flushing (Red Skin) Laryngeal Edema   Reglan [metoclopramide] Other (See Comments)   Tardive dyskinesia   Valium [diazepam] [name]   Zestril [lisinopril] Anaphylaxis, Swelling   Tongue and mouth swelling Laryngeal Edema   Tolectin [tolmetin] Nausea And Vomiting, Nausea Only, Other (See Comments)   Irritates stomach ulcer   Asa [aspirin] Other (See Comments)   Hx of stomach ulcer   Aspartame And Phenylalanine Hives   Bentyl [dicyclomine] Other (See Comments)   Chest pain   Hibiclens [chlorhexidine Gluconate] Other (See Comments)   Dermatitis    Flexeril [cyclobenzaprine] Palpitations   Oxycontin [oxycodone] Palpitations   Rifamycins Palpitations   Tylenol [acetaminophen] Nausea And Vomiting, Nausea Only, Other (See Comments)   Irritates stomach  ulcer Abdominal pain   Ultram [tramadol] Nausea And Vomiting, Palpitations        Medication List     STOP taking these medications    Acidophilus Caps capsule   insulin lispro 100 UNIT/ML KwikPen Commonly known as: HUMALOG   metoprolol tartrate 25 MG tablet Commonly known as: LOPRESSOR   SENIOR MULTIVITAMIN PLUS PO       TAKE these medications    (feeding supplement) PROSource Plus liquid Take 30 mLs by mouth 2 (two) times daily between meals.   acetaminophen 325 MG tablet Commonly known as: TYLENOL Take 1 tablet (325 mg total) by mouth 2 (two) times daily.   albuterol (2.5 MG/3ML) 0.083% nebulizer solution Commonly known as: PROVENTIL Take 3 mLs (2.5 mg total) by nebulization every 6 (six) hours as needed for wheezing or shortness of breath.   allopurinol 100 MG tablet Commonly known as: ZYLOPRIM Take 1 tablet (100 mg total) by mouth 2 (two) times daily.   ascorbic acid 500 MG tablet Commonly known as: VITAMIN C Take 500 mg by mouth daily.   atorvastatin 10 MG tablet Commonly known as: LIPITOR Take 10 mg by mouth every evening.   b complex-vitamin c-folic acid 0.8 MG Tabs tablet Take 1 tablet by mouth at bedtime.   busPIRone 5 MG tablet Commonly known as:  BUSPAR Take 1 tablet (5 mg total) by mouth 3 (three) times daily.   calcium carbonate 500 MG chewable tablet Commonly known as: TUMS - dosed in mg elemental calcium Chew 1 tablet (200 mg of elemental calcium total) by mouth 2 (two) times daily as needed for indigestion or heartburn.   cetirizine 10 MG tablet Commonly known as: ZYRTEC Take 10 mg by mouth daily.   cholestyramine light 4 g packet Commonly known as: PREVALITE Take 1 packet (4 g total) by mouth 2 (two) times daily.   diclofenac Sodium 1 % Gel Commonly known as: VOLTAREN Apply 2 g topically 4 (four) times daily.   DULoxetine 30 MG capsule Commonly known as: CYMBALTA Take 1 capsule (30 mg total) by mouth daily.   famotidine 20  MG tablet Commonly known as: PEPCID Take 20 mg by mouth daily before breakfast.   ferric citrate 1 GM 210 MG(Fe) tablet Commonly known as: AURYXIA Take 2 tablets (420 mg total) by mouth 3 (three) times daily with meals. What changed: how much to take   fluticasone 50 MCG/ACT nasal spray Commonly known as: FLONASE Place 2 sprays into both nostrils daily as needed for allergies or rhinitis.   folic acid 1 MG tablet Commonly known as: FOLVITE Take 1 tablet (1 mg total) by mouth daily.   hydrocortisone cream 1 % Apply 1 Application topically 2 (two) times daily.   HYDROmorphone 4 MG tablet Commonly known as: DILAUDID Take 1 tablet (4 mg total) by mouth every 6 (six) hours as needed for severe pain (pain score 7-10). What changed:  medication strength how much to take reasons to take this   hydrOXYzine 25 MG tablet Commonly known as: ATARAX Take 1 tablet (25 mg total) by mouth every 4 (four) hours as needed for anxiety. What changed: when to take this   hyoscyamine 0.125 MG SL tablet Commonly known as: LEVSIN SL Place 1 tablet (0.125 mg total) under the tongue 3 (three) times daily.   insulin aspart 100 UNIT/ML injection Commonly known as: novoLOG Inject 0-6 Units into the skin 3 (three) times daily with meals.   loperamide 2 MG capsule Commonly known as: IMODIUM Take 1 capsule (2 mg total) by mouth every 6 (six) hours.   methocarbamol 500 MG tablet Commonly known as: ROBAXIN Take 1 tablet (500 mg total) by mouth every 6 (six) hours as needed for muscle spasms.   midodrine 10 MG tablet Commonly known as: PROAMATINE Take 1 tablet (10 mg total) by mouth every Monday, Wednesday, and Friday with hemodialysis. What changed:  when to take this Another medication with the same name was removed. Continue taking this medication, and follow the directions you see here.   mirtazapine 15 MG tablet Commonly known as: REMERON Take 15 mg by mouth at bedtime.   ondansetron 4 MG  disintegrating tablet Commonly known as: ZOFRAN-ODT Take 1 tablet (4 mg total) by mouth every 8 (eight) hours as needed for nausea or vomiting.   pantoprazole 40 MG tablet Commonly known as: PROTONIX Take 1 tablet (40 mg total) by mouth 2 (two) times daily. What changed: when to take this   promethazine 25 MG tablet Commonly known as: PHENERGAN Take 1 tablet (25 mg total) by mouth 4 (four) times daily -  before meals and at bedtime.   risperiDONE 1 MG disintegrating tablet Commonly known as: RISPERDAL M-TABS Take 1 tablet (1 mg total) by mouth at bedtime.   saccharomyces boulardii 250 MG capsule Commonly known as: FLORASTOR Take  250 mg by mouth in the morning, at noon, and at bedtime.   traZODone 50 MG tablet Commonly known as: DESYREL Take 1 tablet (50 mg total) by mouth at bedtime.        Day of Discharge BP (!) 110/54 (BP Location: Right Arm)   Pulse 75   Temp (!) 97.5 F (36.4 C) (Oral)   Resp 18   Ht (P) 5\' 6"  (1.676 m)   Wt 114.5 kg   LMP 10/10/2012   SpO2 100%   BMI (P) 40.74 kg/m   Physical Exam: General: No acute respiratory distress Lungs: Clear to auscultation bilaterally without wheezes or crackles Cardiovascular: Regular rate and rhythm without murmur gallop or rub normal S1 and S2 Abdomen: Nontender, nondistended, soft, bowel sounds positive, no rebound, no ascites, no appreciable mass Extremities: No significant cyanosis, clubbing, or edema bilateral lower extremities  Basic Metabolic Panel: Recent Labs  Lab 06/15/23 1245 06/17/23 1202 06/18/23 0500 06/20/23 0933  NA 136 138 136 137  K 4.1 3.4* 3.2* 3.8  CL 99 100 103 102  CO2 22 22 21* 23  GLUCOSE 106* 210* 146* 124*  BUN 49* 29* 33* 23*  CREATININE 7.74* 6.28* 6.61* 5.83*  CALCIUM 8.1* 8.3* 8.0* 8.4*  PHOS 7.3* 6.3* 6.8* 6.1*    CBC: Recent Labs  Lab 06/15/23 1245 06/17/23 1202 06/18/23 0500 06/20/23 0933  WBC 9.4 8.7 9.4 8.7  HGB 7.6* 8.2* 7.5* 8.3*  HCT 25.5* 27.9* 25.2*  28.0*  MCV 98.5 98.9 99.6 99.3  PLT 354 368 303 268    Time spent in discharge (includes decision making & examination of pt): 35 minutes  06/20/2023, 1:54 PM   Abbe Abate, MD [facility] Office  [phone]

## 2023-06-20 NOTE — Consult Note (Signed)
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 50 MG tablet, Take 1 tablet (50 mg total) by mouth at bedtime., Disp: , Rfl:   Allergies: Allergies  Allergen Reactions   Gabapentin Hives and [name]   Isovue [Iopamidol] Anaphylaxis, [name] and Other (See Comments)    11/28/17 Patient had seizure like activity and then 1 min code after 100 cc of isovue 300. Possible contrast allergy vs vasovagal episode  Cardiac Arrest   Nsaids Anaphylaxis and Other (See Comments)    Hx of stomach ulcers   Penicillins Itching, Palpitations and Other (See Comments)    Flushing (Red Skin) Laryngeal Edema   Reglan [Metoclopramide] Other (See Comments)    Tardive dyskinesia  Valium [Diazepam] [name]   Zestril [Lisinopril] Anaphylaxis and Swelling    Tongue and mouth swelling Laryngeal Edema   Tolectin [Tolmetin] Nausea And Vomiting, Nausea Only and Other (See Comments)    Irritates stomach ulcer   Asa [Aspirin] Other (See Comments)    Hx of stomach ulcer   Aspartame And Phenylalanine Hives   Bentyl [Dicyclomine] Other (See Comments)    Chest  pain   Hibiclens [Chlorhexidine Gluconate] Other (See Comments)    Dermatitis    Flexeril [Cyclobenzaprine] Palpitations   Oxycontin [Oxycodone] Palpitations   Rifamycins Palpitations   Tylenol [Acetaminophen] Nausea And Vomiting, Nausea Only and Other (See Comments)    Irritates stomach ulcer Abdominal pain   Ultram [Tramadol] Nausea And Vomiting and Palpitations    Rella Cardinal, FNP

## 2023-06-20 NOTE — Progress Notes (Signed)
 Advised by CSW that pt will d/c to snf today. Contacted [facility] to be advised of pt's d/c to snf today and that pt should resume care tomorrow. Clinic provided snf name as well. HD info placed on AVS.   Lauraine Polite Renal Navigator [phone]

## 2023-06-20 NOTE — Discharge Planning (Signed)
 [facility] Patient Discharge Orders- [facility] CLINIC: [place]  Patient's name: Deborah [name] Admit/DC Dates: 05/29/2023 - 06/20/2023  Discharge Diagnoses: Chronic gastroparesis  Chronic pain syndrome  Aranesp: Given: yes   Date and amount of last dose: 200 mcg 06/15/2023  Last Hgb: 8.3 PRBC's Given: No Date/# of units: NA ESA dose for discharge: mircera 225 mcg IV q 2 weeks  IV Iron dose at discharge: per protocol  Heparin change: No  EDW Change: yes New EDW: 107 kg-this is a bed wt. Please assess on admission  Bath Change: No  Access intervention/Change: No Details:  Hectorol/Calcitriol change: No  Discharge Labs: Calcium 8.4 Phosphorus 6.1 Albumin 2.5 K+ 3.8  IV Antibiotics: No Details:  On Coumadin?: No Last INR: Next INR: Managed By:   OTHER/APPTS/LAB ORDERS:    D/C Meds to be reconciled by nurse after every discharge.  Completed By: Jacobo Masters [facility] [phone]   Reviewed by: MD:______ RN_______

## 2023-06-20 NOTE — TOC Transition Note (Signed)
 Transition of Care [facility]) - Discharge Note   Patient Details  Name: Deborah [name] MRN: [id number] Date of Birth: 14-Dec-1965  Transition of Care [facility]) CM/SW Contact:  Tandy Fam, LCSW Phone Number: 06/20/2023, 2:42 PM   Clinical Narrative:   CSW coordinated with [facility] about patient information earlier today, answered questions and assisted with arranging assessment to be completed by SNF to confirm bed offer. The [facility] representative met with patient and are agreeable to bed offer. CSW completed LOG letter and gave to [facility] CMA. CSW notified MD and renal navigator of patient ability to discharge today. Patient medically stable, CSW sent discharge information to [facility]. CSW confirmed they are able to take to HD tomorrow. Transport arranged with [facility] for next available.  Nurse to call report to [phone], Room 213A.    Final next level of care: Skilled Nursing Facility Barriers to Discharge: Barriers Resolved   Patient Goals and CMS Choice Patient states their goals for this hospitalization and ongoing recovery are:: To get better CMS Medicare.gov Compare Post Acute Care list provided to:: Patient Choice offered to / list presented to : [name] ownership interest in [facility].provided to:: Patient    Discharge Placement              Patient chooses bed at:  [facility]) Patient to be transferred to facility by: [facility] Name of family member notified: Patient Patient and family notified of of transfer: 06/20/23  Discharge Plan and Services Additional resources added to the After Visit Summary for   In-house Referral: Clinical Social Work                DME Agency: NA                  Social Drivers of Health (SDOH) Interventions SDOH Screenings   Food Insecurity: No Food Insecurity (05/29/2023)  Housing: Patient Declined (05/29/2023)  Recent Concern: Housing - High Risk (04/25/2023)  Transportation Needs:  Unmet Transportation Needs (05/29/2023)  Utilities: Not At Risk (05/29/2023)  Depression (PHQ2-9): Medium Risk (03/16/2023)  Financial Resource Strain: Low Risk (03/23/2021)   Received from [facility])  Physical Activity: Not on File (10/31/2017)   Received from [place], [place]  Social Connections: Unknown (07/19/2021)   Received from [facility]  Stress: Low Risk (03/23/2021)   [facility])  Tobacco Use: Low Risk  (05/29/2023)     Readmission Risk Interventions    03/07/2023    4:19 PM 11/25/2022    5:16 PM 07/05/2022    1:20 PM  Readmission Risk Prevention Plan  Transportation Screening Complete Complete Complete  Medication Review (RN Care Manager) Complete Complete Referral to Pharmacy  PCP or Specialist appointment within 3-5 days of discharge Complete Complete Complete  HRI or Home Care Consult Complete Complete Complete  SW Recovery Care/Counseling Consult Complete Complete Complete  Palliative Care Screening Complete Not Applicable Not Applicable  Skilled Nursing Facility Not Applicable Not Applicable Not Applicable

## 2023-06-20 NOTE — Plan of Care (Signed)
  Problem: Health Behavior/Discharge Planning: Goal: Ability to identify and utilize available resources and services will improve Outcome: Progressing Goal: Ability to manage health-related needs will improve Outcome: Progressing   Problem: Metabolic: Goal: Ability to maintain appropriate glucose levels will improve Outcome: Progressing   Problem: Nutritional: Goal: Maintenance of adequate nutrition will improve Outcome: Progressing Goal: Progress toward achieving an optimal weight will improve Outcome: Progressing   Problem: Skin Integrity: Goal: Risk for impaired skin integrity will decrease Outcome: Progressing   Problem: Tissue Perfusion: Goal: Adequacy of tissue perfusion will improve Outcome: Progressing   Problem: Education: Goal: Knowledge of General Education information will improve Description: Including pain rating scale, medication(s)/side effects and non-pharmacologic comfort measures Outcome: Progressing   Problem: Health Behavior/Discharge Planning: Goal: Ability to manage health-related needs will improve Outcome: Progressing   Problem: Clinical Measurements: Goal: Ability to maintain clinical measurements within normal limits will improve Outcome: Progressing Goal: Will remain free from infection Outcome: Progressing Goal: Diagnostic test results will improve Outcome: Progressing Goal: Respiratory complications will improve Outcome: Progressing Goal: Cardiovascular complication will be avoided Outcome: Progressing   Problem: Activity: Goal: Risk for activity intolerance will decrease Outcome: Progressing   Problem: Nutrition: Goal: Adequate nutrition will be maintained Outcome: Progressing   Problem: Coping: Goal: Level of anxiety will decrease Outcome: Progressing   Problem: Elimination: Goal: Will not experience complications related to bowel motility Outcome: Progressing Goal: Will not experience complications related to urinary  retention Outcome: Progressing   Problem: Pain Managment: Goal: General experience of comfort will improve and/or be controlled Outcome: Progressing   Problem: Safety: Goal: Ability to remain free from injury will improve Outcome: Progressing   Problem: Skin Integrity: Goal: Risk for impaired skin integrity will decrease Outcome: Progressing   Problem: Education: Goal: Knowledge of disease and its progression will improve Outcome: Progressing Goal: Individualized Educational Video(s) Outcome: Progressing   Problem: Fluid Volume: Goal: Compliance with measures to maintain balanced fluid volume will improve Outcome: Progressing   Problem: Health Behavior/Discharge Planning: Goal: Ability to manage health-related needs will improve Outcome: Progressing   Problem: Nutritional: Goal: Ability to make healthy dietary choices will improve Outcome: Progressing   Problem: Clinical Measurements: Goal: Complications related to the disease process, condition or treatment will be avoided or minimized Outcome: Progressing

## 2023-06-30 ENCOUNTER — Ambulatory Visit: Admitting: Orthopedic Surgery

## 2023-07-09 IMAGING — CR DG CHEST 2V
2 series · 2 of 2 positions shown · non-contrast
Comparison: 01/31/2021

CLINICAL DATA: Chest pain

EXAM:
CHEST - 2 VIEW

[chest lat]
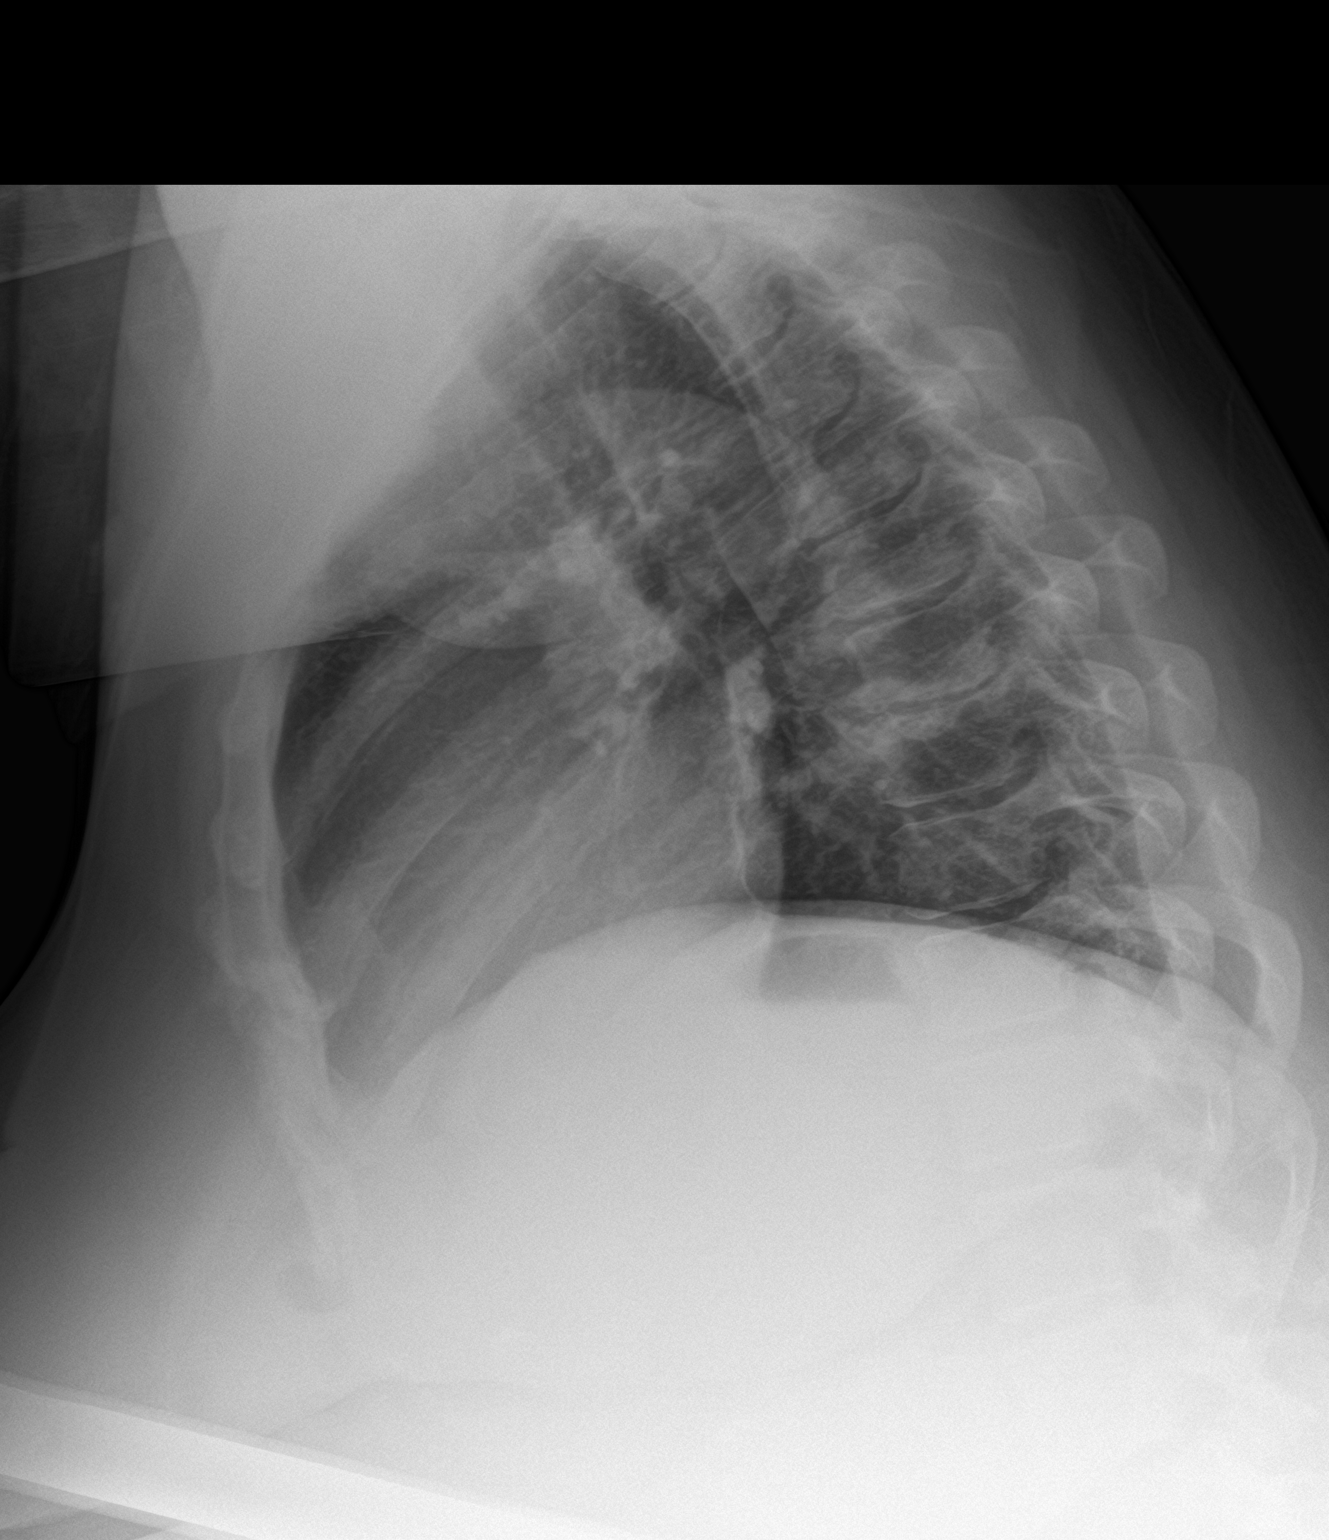

[chest ap]
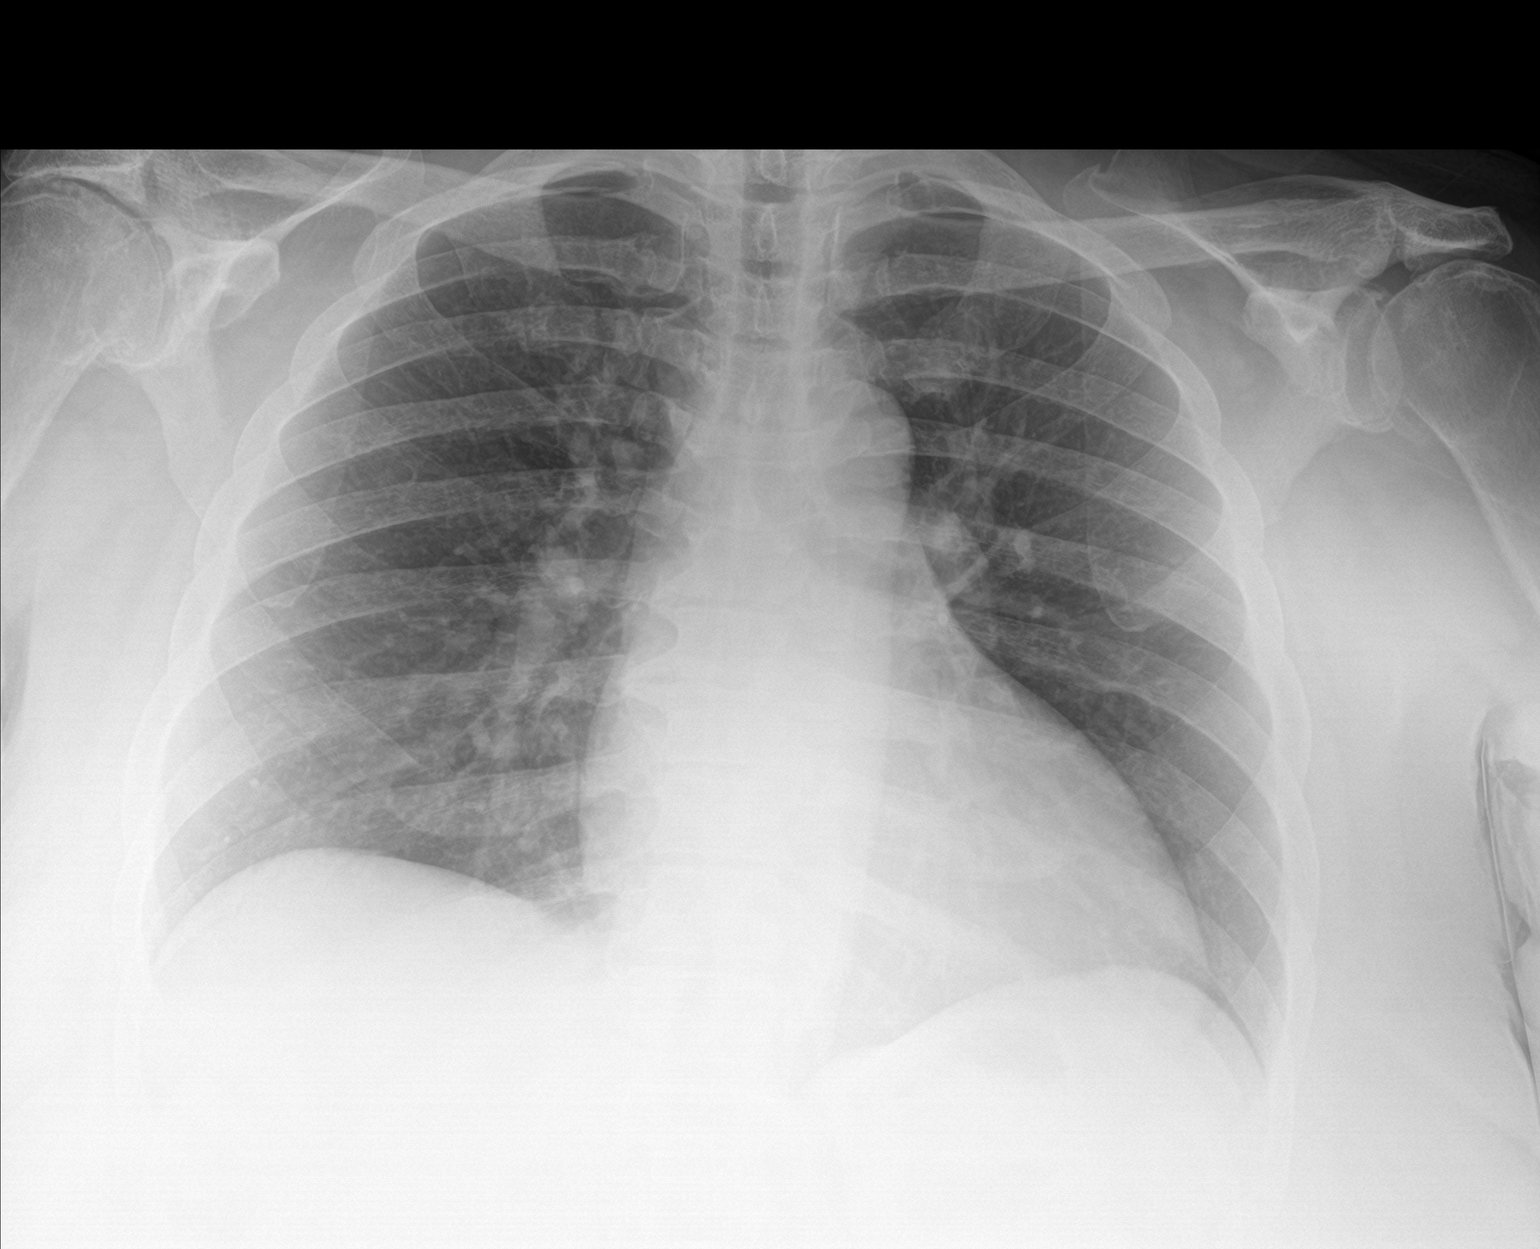

[2 of 2 positions shown; findings below may reference images not displayed]

FINDINGS: Mildly enlarged heart size. Negative aortic and hilar contours.
There is no edema, consolidation, effusion, or pneumothorax.
IMPRESSION: No evidence of active disease.

## 2023-07-09 IMAGING — CT CT ABD-PELV W/O CM
2 of 4 series · 15 of 46 positions shown, 17 images · non-contrast
Comparison: 04/13/2021

CLINICAL DATA: Abdominal pain



[Series 3: ap without · axial · non-contrast · 0.96mm/px · z∈[+953,+1403]mm · 12 of 102 slices shown, 14 images]
[im 6/102  soft-tissue]
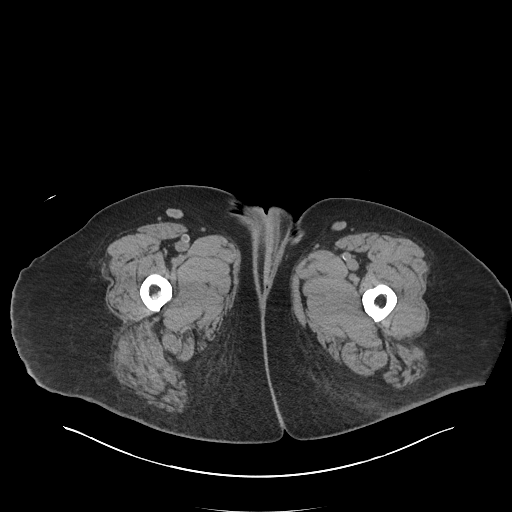
[im 6/102  bone]
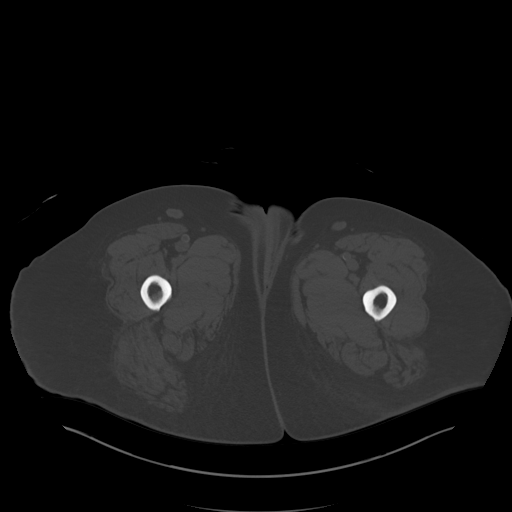
[im 16/102  soft-tissue]
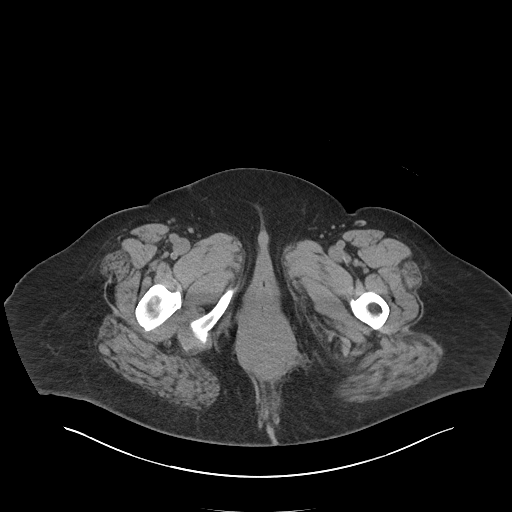
[im 22/102  soft-tissue]
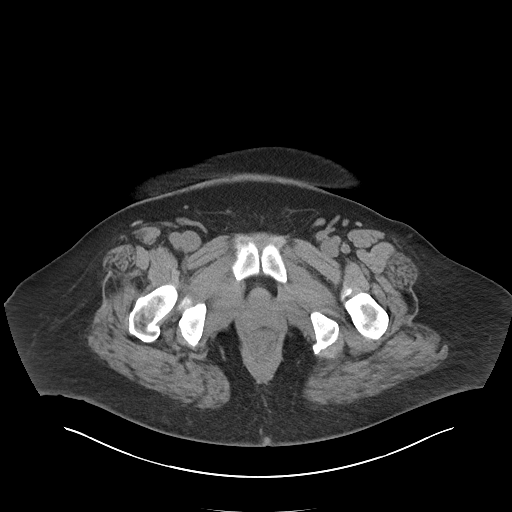
[im 32/102  soft-tissue]
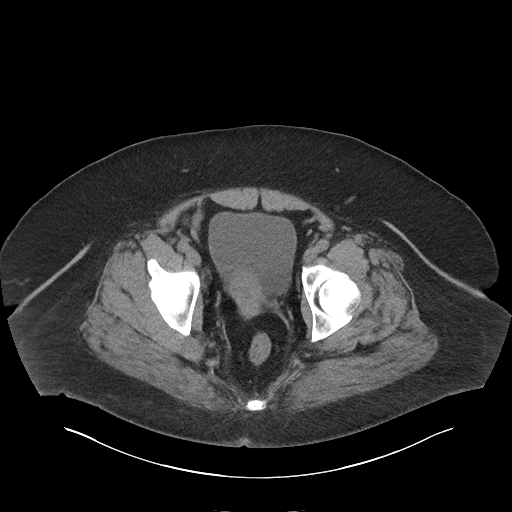
[im 38/102  soft-tissue]
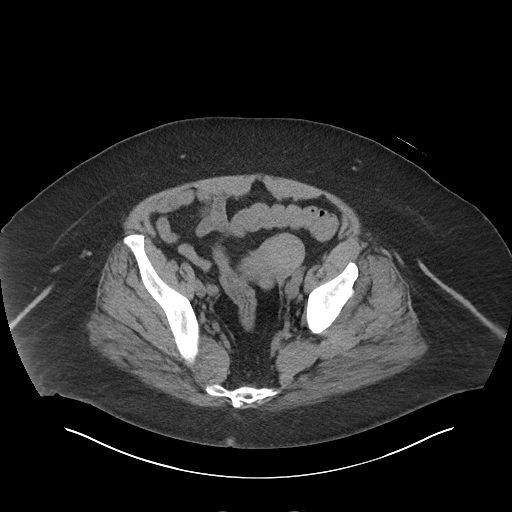
[im 48/102  soft-tissue]
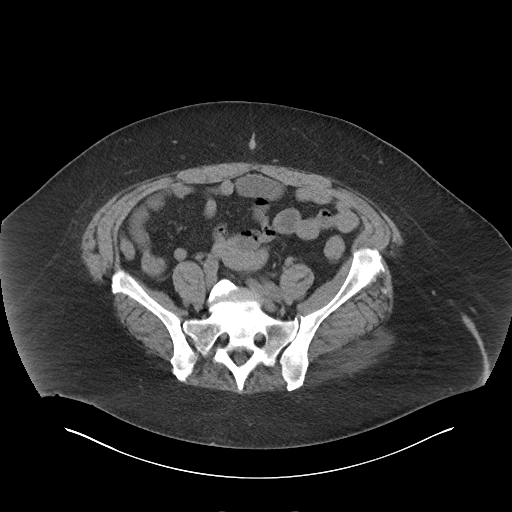
[im 54/102  soft-tissue]
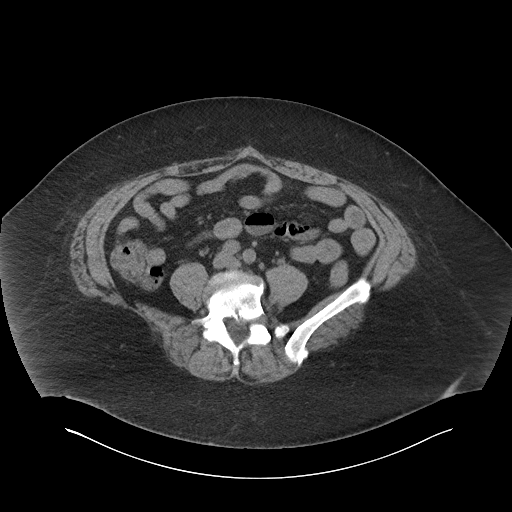
[im 64/102  soft-tissue]
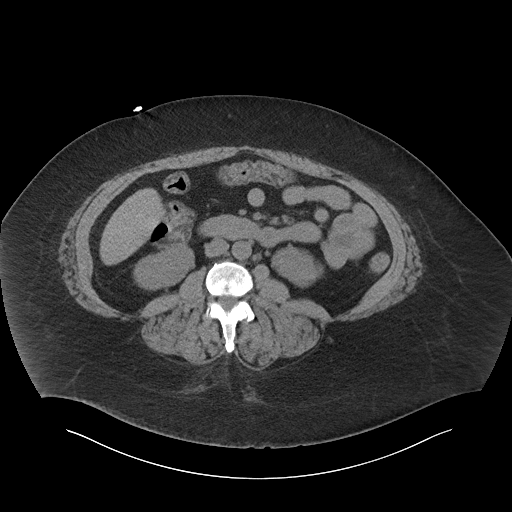
[im 70/102  soft-tissue]
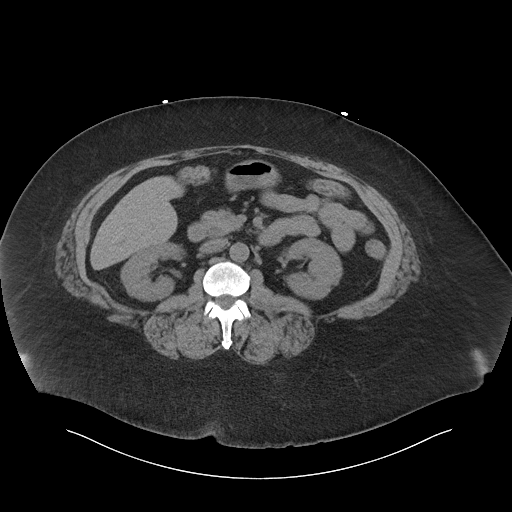
[im 70/102  bone]
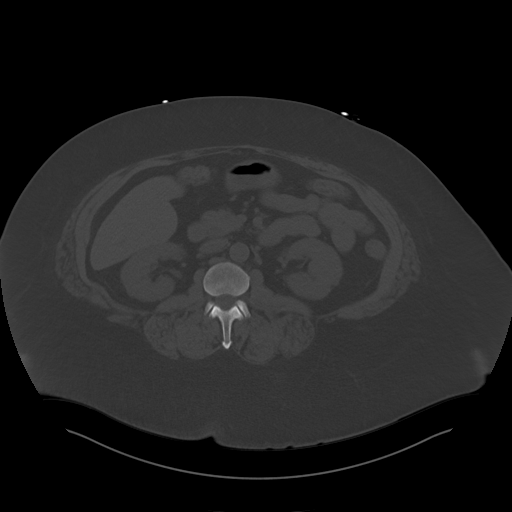
[im 80/102  soft-tissue]
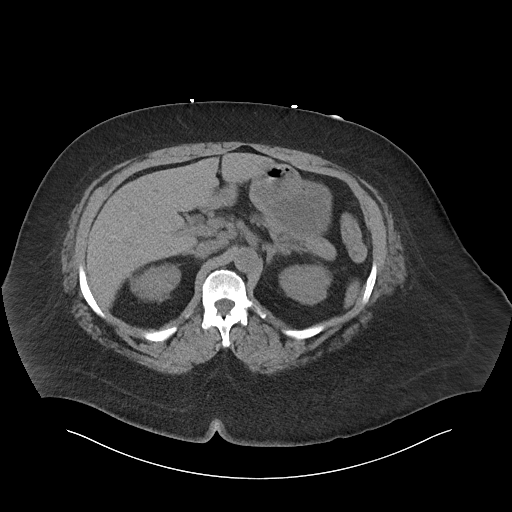
[im 86/102  soft-tissue]
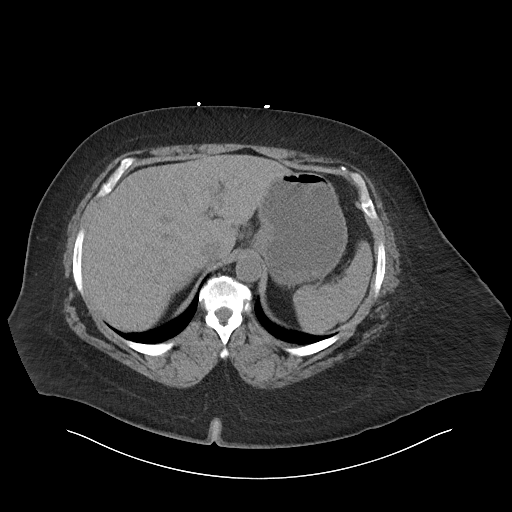
[im 96/102  soft-tissue]
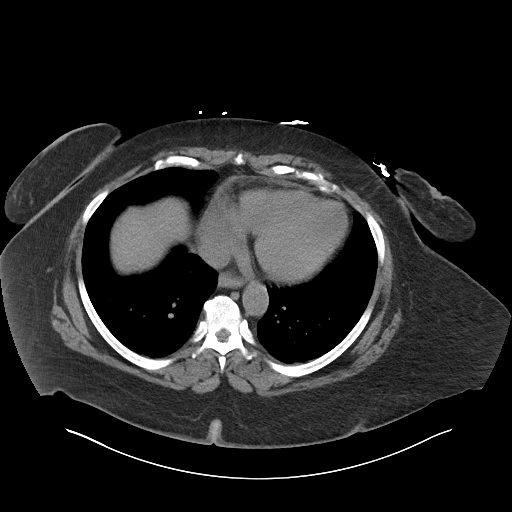

[Series 6: cor · coronal · 0.81mm/px · 3 of 115 slices shown]
[im 39/115  soft-tissue]
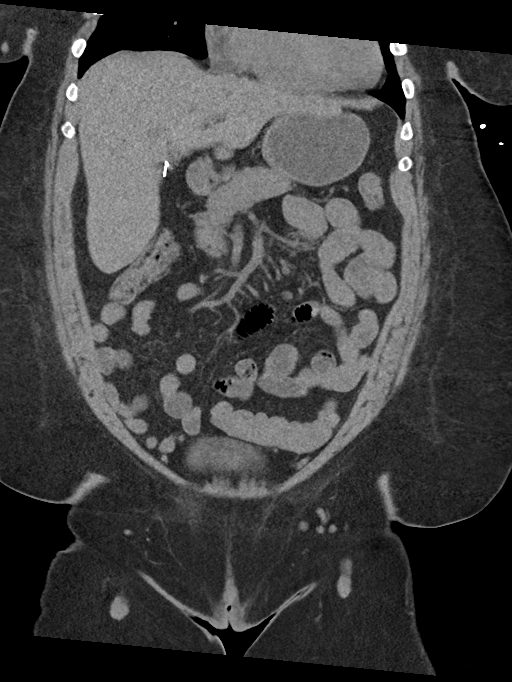
[im 51/115  soft-tissue]
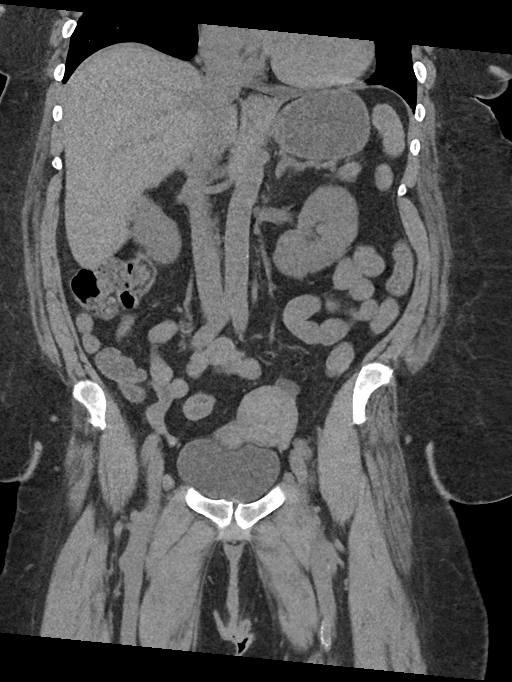
[im 64/115  soft-tissue]
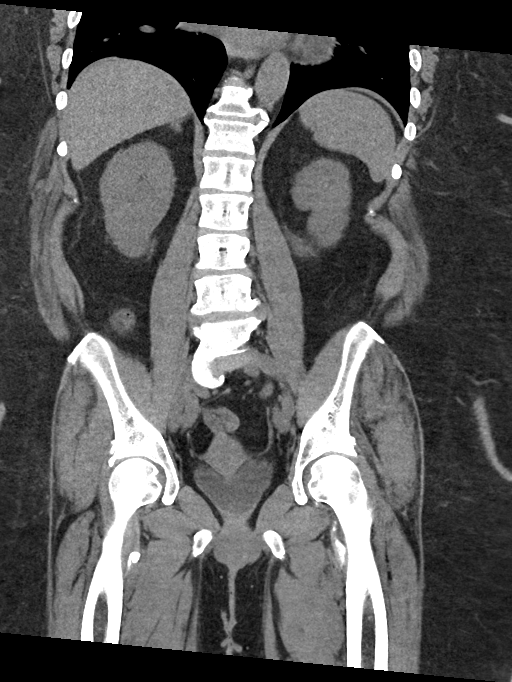

[15 of 46 positions shown; findings below may reference images not displayed]

FINDINGS: Lower chest: New small patchy ground-glass densities seen in the
right lower lung fields. There is no pleural effusion.

Hepatobiliary: Liver measures 19 cm. No focal abnormality is seen.
Surgical clips are seen in the gallbladder fossa.

Pancreas: No focal abnormality is seen.

Spleen: Unremarkable.

Adrenals/Urinary Tract: Adrenals are unremarkable. There is no
hydronephrosis. There is possible 1 mm tiny calculus in the lower
pole of left kidney. There are no demonstrable ureteral stones.
Urinary bladder is unremarkable.

Stomach/Bowel: Stomach is unremarkable. Small bowel loops are not
dilated. Appendix is not dilated. There is subtle decreased density
in the submucosal region in the ascending and transverse colon.
There is incomplete distention of descending and sigmoid colon.
There is no pericolic stranding or fluid collection.

Vascular/Lymphatic: Unremarkable.

Reproductive: There is 4.3 cm smooth marginated solid lesion
inseparable from the left side of fundus of the uterus suggesting
fibroid.

Other: There is no ascites or pneumoperitoneum.

Musculoskeletal: Degenerative changes are noted in the lumbar spine,
particularly prominent at L5-S1 level. There is minimal
anterolisthesis at L4-L5 level. Spinal stenosis is seen at L4-L5
level. There is encroachment of neural foramina at L4-L5 and L5-S1
levels.
IMPRESSION: There is no evidence of intestinal obstruction or pneumoperitoneum.
There is no hydronephrosis. Appendix is not dilated.

There is subtle decreased density in the submucosa in the ascending
and transverse colon. This may be an artifact due to incomplete
distention or suggest chronic nonspecific colitis.

Subtle patchy ground-glass densities seen in the right lower lung
fields suggesting possible early interstitial pneumonia.

Possible exophytic uterine fibroid.  Lumbar spondylosis.

Other findings as described in the body of the report.

## 2023-07-11 ENCOUNTER — Ambulatory Visit (INDEPENDENT_AMBULATORY_CARE_PROVIDER_SITE_OTHER): Admitting: Orthopedic Surgery

## 2023-07-11 DIAGNOSIS — Z89511 Acquired absence of right leg below knee: Secondary | ICD-10-CM | POA: Diagnosis not present

## 2023-07-11 DIAGNOSIS — L97421 Non-pressure chronic ulcer of left heel and midfoot limited to breakdown of skin: Secondary | ICD-10-CM

## 2023-07-17 ENCOUNTER — Encounter: Payer: Self-pay | Admitting: Orthopedic Surgery

## 2023-07-17 NOTE — Progress Notes (Signed)
 Office Visit Note   Patient: Deborah [name]           Date of Birth: 08-18-1965           MRN: [id number] Visit Date: 07/11/2023              Requested by: No referring provider defined for this encounter. PCP: Pcp, No  Chief Complaint  Patient presents with   Right Leg - Follow-up    02/02/2023 revision right BKA [id number]      HPI: Patient is a 58 year old woman who is 5 months status post right below-knee amputation revision.  Patient also presents with a left foot heel ulcer.  Assessment & Plan: Visit Diagnoses:  1. Right below-knee amputee [facility])     Plan: Patient is provided prescription for [employer] for a stump shrinker on the right.  Patient will continue with Vashe or manuka honey for the left heel ulcer and float her heel off the ground.  Follow-Up Instructions: Return in about 4 weeks (around 08/08/2023).   Ortho Exam  Patient is alert, oriented, no adenopathy, well-dressed, normal affect, normal respiratory effort. Examination patient has increased swelling of the residual limb on the right.  Her calf is 63 cm in circumference.  The incision is well-healed there is no cellulitis or drainage.  Examination of the left heel she has a lateral left heel ulcer that is 1 cm in diameter.  There is no depth no tunneling no drainage or cellulitis.  Imaging: No results found. No images are attached to the encounter.  Labs: Lab Results  Component Value Date   HGBA1C 5.7 (H) 02/04/2023   HGBA1C 8.1 (H) 06/19/2022   HGBA1C 10.1 (H) 03/21/2022   ESRSEDRATE 90 (H) 01/29/2023   ESRSEDRATE 119 (H) 12/21/2022   ESRSEDRATE 122 (H) 11/24/2022   CRP 5.9 (H) 02/11/2023   CRP 8.0 (H) 02/10/2023   CRP 8.2 (H) 02/09/2023   LABURIC 6.5 02/06/2023   LABURIC 6.7 11/28/2022   LABURIC 6.2 07/04/2022   REPTSTATUS 02/22/2023 FINAL 02/17/2023   CULT  02/17/2023    NO GROWTH 5 DAYS Performed at [facility], [address] [place], [place] [address]    LABORGA  ESCHERICHIA COLI (A) 08/15/2021     Lab Results  Component Value Date   ALBUMIN  2.5 (L) 06/20/2023   ALBUMIN  2.1 (L) 06/18/2023   ALBUMIN  2.4 (L) 06/17/2023    Lab Results  Component Value Date   MG 1.9 05/31/2023   MG 2.3 05/29/2023   MG 2.1 04/28/2023   No results found for: "VD25OH"  No results found for: "PREALBUMIN"    Latest Ref Rng & Units 06/20/2023    9:33 AM 06/18/2023    5:00 AM 06/17/2023   12:02 PM  CBC EXTENDED  WBC 4.0 - 10.5 K/uL 8.7  9.4  8.7   RBC 3.87 - 5.11 MIL/uL 2.82  2.53  2.82   Hemoglobin 12.0 - 15.0 g/dL 8.3  7.5  8.2   HCT 35.5 - 46.0 % 28.0  25.2  27.9   Platelets 150 - 400 K/uL 268  303  368      There is no height or weight on file to calculate BMI.  Orders:  No orders of the defined types were placed in this encounter.  No orders of the defined types were placed in this encounter.    Procedures: No procedures performed  Clinical Data: No additional findings.  ROS:  All other systems negative, except as  noted in the HPI. Review of Systems  Objective: Vital Signs: LMP 10/10/2012   Specialty Comments:  No specialty comments available.  PMFS History: Patient Active Problem List   Diagnosis Date Noted   Obesity, class 2 06/09/2023   Acute on chronic diastolic CHF (congestive heart failure) (HCC) 05/29/2023   Gastroparesis 05/29/2023   Chronic wound of left heel 05/29/2023   Amputation of right lower extremity below knee (HCC) 04/30/2023   Non-pressure chronic ulcer of other part of left foot limited to breakdown of skin (HCC) 04/30/2023   Dysphagia 04/28/2023   Volume overload 04/25/2023   Chronic diastolic CHF (congestive heart failure) (HCC) 04/25/2023   Carcinoid tumor of abdomen 04/25/2023   PVD (peripheral vascular disease) (HCC) 04/25/2023   History of GI bleed 04/25/2023   History of arteriovenous malformation (AVM) 04/25/2023   Generalized anxiety disorder 04/25/2023   History of reactive airway disease  04/25/2023   History of spinal stenosis 04/25/2023   Chronic diabetic ulcer of left foot determined by examination (HCC) 04/25/2023   Diarrhea 03/01/2023   Intractable diarrhea 02/28/2023   Sinus tachycardia 02/17/2023   Cellulitis of heel, left 02/17/2023   Hx of BKA, right (HCC) 01/31/2023   Tardive dyskinesia 01/06/2023   Peutz-Jeghers polyps of small bowel (HCC) 12/24/2022   Gastric AVM 12/23/2022   Anemia of chronic disease 12/14/2022   Pressure injury of skin 11/30/2022   Non-pressure chronic ulcer of right heel and midfoot limited to breakdown of skin (HCC) 11/24/2022   ESRD on dialysis (HCC) 09/09/2022   Peripheral neuropathy 09/09/2022   (HFpEF) heart failure with preserved ejection fraction (HCC) 09/09/2022   CHF (congestive heart failure) (HCC) 07/13/2022   Hyponatremia 05/29/2022   Leukocytosis 05/11/2022   Edema of left upper extremity 03/21/2022   Acute pyelonephritis    Paroxysmal atrial fibrillation (HCC)    Chronic pain syndrome/chronic abdominal pain 09/04/2019   Malignant carcinoid tumor of duodenum (HCC)    NASH (nonalcoholic steatohepatitis) 06/05/2019   Chronic diarrhea with history of C.Diff    Restrictive lung disease secondary to obesity    History of gastric ulcer    Fibroid uterus 02/23/2019   Abdominal pain 07/17/2018   Anxiety 11/29/2017   Spinal stenosis, lumbar region with neurogenic claudication 08/03/2017   GERD (gastroesophageal reflux disease) 03/19/2017   Chronic depression/anxiety 03/19/2017   Morbid obesity (HCC)    Normocytic anemia 08/16/2016   Chronic gout 06/05/2016   Chronic hypokalemia 09/26/2015   Hypomagnesemia and hypokalemia 09/26/2015   Insulin  dependent type 2 diabetes mellitus (HCC) 05/25/2015   Essential hypertension 09/28/2013   Past Medical History:  Diagnosis Date   Acute back pain with sciatica, left    Acute back pain with sciatica, right    Acute encephalopathy 05/29/2022   Acute osteomyelitis of right calcaneus  (HCC) 12/21/2022   Anemia, unspecified    Atrial fibrillation with RVR (HCC)-resolved 09/09/2022   Atypical chest pain 09/10/2021   Cancer (HCC)    Carcinoid tumor of duodenum    Chest pain with normal coronary angiography 2019   Chronic a-fib (HCC) 09/09/2022   Chronic pain    Chronic systolic CHF (congestive heart failure) (HCC)    Dehiscence of amputation stump of right lower extremity (HCC) 01/29/2023   Diabetes mellitus    DKA (diabetic ketoacidosis) (HCC)    Drug-seeking behavior    21 hospitalizations and 14 CT a/p in 2 years for N/V and abdominal pain, demanding only IV dilaudid    Elevated troponin    chronic  Esophageal reflux    Fibromyalgia    Gastric ulcer    Gastroparesis    Gout    HCAP (healthcare-associated pneumonia) 06/19/2022   Hyperlipidemia    Hyperosmolar hyperglycemic state (HHS) (HCC) 05/11/2022   Hyperosmolar non-ketotic state due to type 2 diabetes mellitus (HCC) 05/11/2022   Hypertension    Hypomagnesemia    Lumbosacral stenosis    LVH (left ventricular hypertrophy)    Morbid obesity (HCC)    Nausea & vomiting 09/09/2022   NICM (nonischemic cardiomyopathy) (HCC)    PAF (paroxysmal atrial fibrillation) (HCC)    Sepsis (HCC) 11/23/2022   Stroke (HCC) 02/2011   Symptomatic anemia 12/14/2022   Thrombocytosis    Vitamin B12 deficiency anemia     Family History  Problem Relation Age of Onset   Diabetes Mother    Diabetes Father    Heart disease Father    Diabetes Sister    Congestive Heart Failure Sister 35   Diabetes Brother     Past Surgical History:  Procedure Laterality Date   ABDOMINAL AORTOGRAM W/LOWER EXTREMITY N/A 11/29/2022   Procedure: ABDOMINAL AORTOGRAM W/LOWER EXTREMITY;  Surgeon: Philipp Brawn, MD;  Location: [facility] INVASIVE [facility] LAB;  Service: Cardiovascular;  Laterality: N/A;   AMPUTATION Right 12/29/2022   Procedure: RIGHT BELOW KNEE AMPUTATION;  Surgeon: Timothy Ford, MD;  Location: [facility] OR;  Service: Orthopedics;  Laterality:  Right;   AV FISTULA PLACEMENT Left 06/30/2022   Procedure: LEFT BRACHIOCEPHALIC ARTERIOVENOUS (AV) FISTULA CREATION;  Surgeon: Young Hensen, MD;  [facility] OR;  Service: Vascular;  Laterality: Left;   BIOPSY  07/27/2019   Procedure: BIOPSY;  Surgeon: Ozell Blunt, MD;  Location: [facility] ENDOSCOPY;  Service: Endoscopy;;   BIOPSY  07/30/2019   Procedure: BIOPSY;  Surgeon: Felecia Hopper, MD;  Location: [facility] ENDOSCOPY;  Service: Gastroenterology;;   BIOPSY  04/28/2023   Procedure: BIOPSY;  Surgeon: Daina Drum, MD;  Location: [facility] ENDOSCOPY;  Service: Gastroenterology;;   CATARACT EXTRACTION  01/2014   CHOLECYSTECTOMY     COLONOSCOPY WITH PROPOFOL  N/A 07/30/2019   Procedure: COLONOSCOPY WITH PROPOFOL ;  Surgeon: Felecia Hopper, MD;  Location: [facility] ENDOSCOPY;  Service: Gastroenterology;  Laterality: N/A;   ESOPHAGOGASTRODUODENOSCOPY N/A 07/27/2019   Procedure: ESOPHAGOGASTRODUODENOSCOPY (EGD);  Surgeon: Ozell Blunt, MD;  Location: [facility];  Service: Endoscopy;  Laterality: N/A;   ESOPHAGOGASTRODUODENOSCOPY N/A 07/26/2020   Procedure: ESOPHAGOGASTRODUODENOSCOPY (EGD);  Surgeon: Evangeline Hilts, MD;  Location: [facility];  Service: Endoscopy;  Laterality: N/A;   ESOPHAGOGASTRODUODENOSCOPY (EGD) WITH PROPOFOL  N/A 08/02/2019   Procedure: ESOPHAGOGASTRODUODENOSCOPY (EGD) WITH PROPOFOL ;  Surgeon: Felecia Hopper, MD;  Location: [facility] ENDOSCOPY;  Service: Gastroenterology;  Laterality: N/A;   ESOPHAGOGASTRODUODENOSCOPY (EGD) WITH PROPOFOL  N/A 12/23/2022   Procedure: ESOPHAGOGASTRODUODENOSCOPY (EGD) WITH PROPOFOL ;  Surgeon: Annis Kinder, DO;  Location: [facility] ENDOSCOPY;  Service: Gastroenterology;  Laterality: N/A;   ESOPHAGOGASTRODUODENOSCOPY (EGD) WITH PROPOFOL  N/A 04/28/2023   Procedure: ESOPHAGOGASTRODUODENOSCOPY (EGD) WITH PROPOFOL ;  Surgeon: Daina Drum, MD;  Location: [facility] ENDOSCOPY;  Service: Gastroenterology;  Laterality: N/A;   FISTULA SUPERFICIALIZATION Left 12/31/2022   Procedure: LEFT  ARM FISTULA TRANSPOSITION;  Surgeon: Adine Hoof, MD;  Location: [facility] OR;  Service: Vascular;  Laterality: Left;   GIVENS CAPSULE STUDY N/A 12/23/2022   Procedure: GIVENS CAPSULE STUDY;  Surgeon: Annis Kinder, DO;  Location: [facility] ENDOSCOPY;  Service: Gastroenterology;  Laterality: N/A;   HEMOSTASIS CLIP PLACEMENT  08/02/2019   Procedure: HEMOSTASIS CLIP PLACEMENT;  Surgeon: Felecia Hopper, MD;  Location: [facility] ENDOSCOPY;  Service:  Gastroenterology;;   HOT HEMOSTASIS N/A 12/23/2022   Procedure: HOT HEMOSTASIS (ARGON PLASMA COAGULATION/BICAP);  Surgeon: Annis Kinder, DO;  Location: [facility] ENDOSCOPY;  Service: Gastroenterology;  Laterality: N/A;   IR FLUORO GUIDE CV LINE RIGHT  06/24/2022   IR US  GUIDE VASC ACCESS RIGHT  06/24/2022   POLYPECTOMY  07/30/2019   Procedure: POLYPECTOMY;  Surgeon: Felecia Hopper, MD;  Location: [facility] ENDOSCOPY;  Service: Gastroenterology;;   POLYPECTOMY  08/02/2019   Procedure: POLYPECTOMY;  Surgeon: Felecia Hopper, MD;  Location: [facility];  Service: Gastroenterology;;   Dixie Frederickson DILATION N/A 04/28/2023   Procedure: SAVORY DILATION;  Surgeon: Daina Drum, MD;  Location: [facility] ENDOSCOPY;  Service: Gastroenterology;  Laterality: N/A;   STUMP REVISION Right 02/02/2023   Procedure: REVISION RIGHT BELOW KNEE AMPUTATION;  Surgeon: Timothy Ford, MD;  Location: [facility] OR;  Service: Orthopedics;  Laterality: Right;   Social History   Occupational History   Not on file  Tobacco Use   Smoking status: Never   Smokeless tobacco: Never  Vaping Use   Vaping status: Never Used  Substance and Sexual Activity   Alcohol use: No   Drug use: No   Sexual activity: Not Currently    Birth control/protection: None

## 2023-08-08 ENCOUNTER — Ambulatory Visit: Admitting: Orthopedic Surgery

## 2023-08-29 ENCOUNTER — Ambulatory Visit: Admitting: Orthopedic Surgery

## 2023-09-11 IMAGING — DX DG CHEST 1V PORT
1 series · 1 of 1 positions shown · non-contrast
Comparison: Chest x-ray 05/30/2021.

CLINICAL DATA: Chest pain with shortness of breath.

EXAM:
PORTABLE CHEST 1 VIEW

[chest ap]
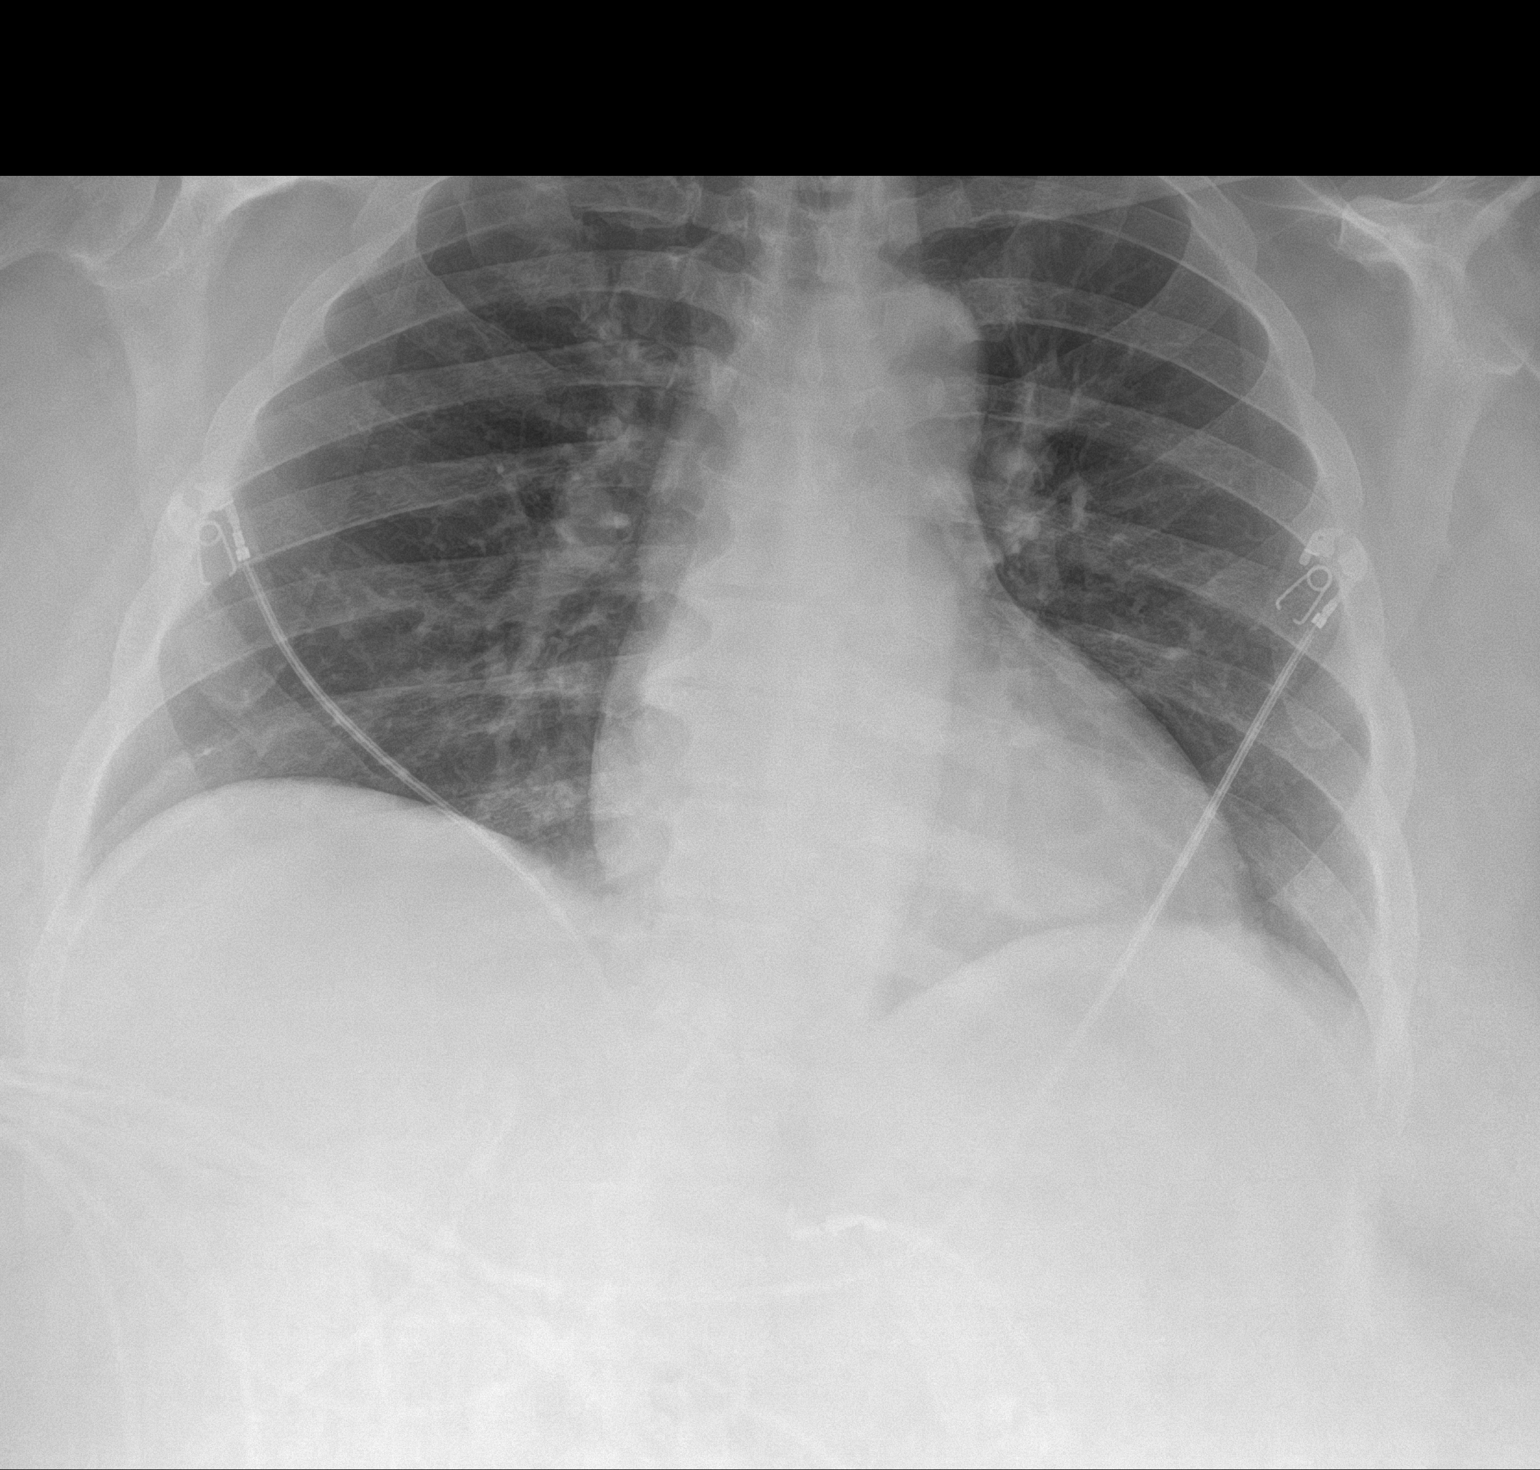

[1 of 1 positions shown; findings below may reference images not displayed]

FINDINGS: The heart size and mediastinal contours are within normal limits.
Both lungs are clear. The visualized skeletal structures are
unremarkable.
IMPRESSION: No active disease.

## 2023-09-12 ENCOUNTER — Ambulatory Visit (INDEPENDENT_AMBULATORY_CARE_PROVIDER_SITE_OTHER): Admitting: Orthopedic Surgery

## 2023-09-12 ENCOUNTER — Encounter: Payer: Self-pay | Admitting: Orthopedic Surgery

## 2023-09-12 DIAGNOSIS — B351 Tinea unguium: Secondary | ICD-10-CM | POA: Diagnosis not present

## 2023-09-12 DIAGNOSIS — L97421 Non-pressure chronic ulcer of left heel and midfoot limited to breakdown of skin: Secondary | ICD-10-CM

## 2023-09-12 DIAGNOSIS — Z89511 Acquired absence of right leg below knee: Secondary | ICD-10-CM | POA: Diagnosis not present

## 2023-09-12 DIAGNOSIS — I872 Venous insufficiency (chronic) (peripheral): Secondary | ICD-10-CM | POA: Diagnosis not present

## 2023-09-12 NOTE — Progress Notes (Signed)
 Office Visit Note   Patient: Deborah [name]           Date of Birth: 05/21/1965           MRN: [id number] Visit Date: 09/12/2023              Requested by: No referring provider defined for this encounter. PCP: Pcp, No  Chief Complaint  Patient presents with   Right Leg - Follow-up    Hx right BKA   Left Foot - Wound Check    Heel wound      HPI: Patient is a 58 year old woman who is seen in follow-up for left heel ulcer.  Patient is also status post a right below-knee amputation and she complains of thickened discolored onychomycotic nails x 5 in the left foot.  Patient currently resides in skilled nursing.  Assessment & Plan: Visit Diagnoses:  1. Right below-knee amputee (HCC)   2. Non-pressure chronic ulcer of left heel and midfoot limited to breakdown of skin (HCC)   3. Onychomycosis     Plan: Nails were trimmed x 5.  No treatment necessary for the left heel ulcer.  Recommended that patient obtain knee-high 15 to 20 mm compression socks for the left lower extremity.  Follow-Up Instructions: No follow-ups on file.   Ortho Exam  Patient is alert, oriented, no adenopathy, well-dressed, normal affect, normal respiratory effort. Examination patient has a stable right below-knee amputation.  Examination the left foot she has a stable ulcer over the lateral aspect of the left heel.  This is 1 cm diameter with healthy tissue no tunneling no drainage no cellulitis no tenderness to palpation.  Her left foot has thickened discolored onychomycotic nails x 5.  She is unable to safely trim them on her own.  The nails were trimmed x 5 without complication.  Patient does have venous stasis swelling in the left leg but no ulcers.    Imaging: No results found. No images are attached to the encounter.  Labs: Lab Results  Component Value Date   HGBA1C 5.7 (H) 02/04/2023   HGBA1C 8.1 (H) 06/19/2022   HGBA1C 10.1 (H) 03/21/2022   ESRSEDRATE 90 (H) 01/29/2023   ESRSEDRATE 119  (H) 12/21/2022   ESRSEDRATE 122 (H) 11/24/2022   CRP 5.9 (H) 02/11/2023   CRP 8.0 (H) 02/10/2023   CRP 8.2 (H) 02/09/2023   LABURIC 6.5 02/06/2023   LABURIC 6.7 11/28/2022   LABURIC 6.2 07/04/2022   REPTSTATUS 02/22/2023 FINAL 02/17/2023   CULT  02/17/2023    NO GROWTH 5 DAYS Performed at [facility], [address], [place], [place] [address]    LABORGA ESCHERICHIA COLI (A) 08/15/2021     Lab Results  Component Value Date   ALBUMIN  2.5 (L) 06/20/2023   ALBUMIN  2.1 (L) 06/18/2023   ALBUMIN  2.4 (L) 06/17/2023    Lab Results  Component Value Date   MG 1.9 05/31/2023   MG 2.3 05/29/2023   MG 2.1 04/28/2023   No results found for: VD25OH  No results found for: PREALBUMIN    Latest Ref Rng & Units 06/20/2023    9:33 AM 06/18/2023    5:00 AM 06/17/2023   12:02 PM  CBC EXTENDED  WBC 4.0 - 10.5 K/uL 8.7  9.4  8.7   RBC 3.87 - 5.11 MIL/uL 2.82  2.53  2.82   Hemoglobin 12.0 - 15.0 g/dL 8.3  7.5  8.2   HCT 63.9 - 46.0 % 28.0  25.2  27.9  Platelets 150 - 400 K/uL 268  303  368      There is no height or weight on file to calculate BMI.  Orders:  No orders of the defined types were placed in this encounter.  No orders of the defined types were placed in this encounter.    Procedures: No procedures performed  Clinical Data: No additional findings.  ROS:  All other systems negative, except as noted in the HPI. Review of Systems  Objective: Vital Signs: LMP 10/10/2012   Specialty Comments:  No specialty comments available.  PMFS History: Patient Active Problem List   Diagnosis Date Noted   Obesity, class 2 06/09/2023   Acute on chronic diastolic CHF (congestive heart failure) (HCC) 05/29/2023   Gastroparesis 05/29/2023   Chronic wound of left heel 05/29/2023   Amputation of right lower extremity below knee (HCC) 04/30/2023   Non-pressure chronic ulcer of other part of left foot limited to breakdown of skin (HCC) 04/30/2023   Dysphagia  04/28/2023   Volume overload 04/25/2023   Chronic diastolic CHF (congestive heart failure) (HCC) 04/25/2023   Carcinoid tumor of abdomen 04/25/2023   PVD (peripheral vascular disease) (HCC) 04/25/2023   History of GI bleed 04/25/2023   History of arteriovenous malformation (AVM) 04/25/2023   Generalized anxiety disorder 04/25/2023   History of reactive airway disease 04/25/2023   History of spinal stenosis 04/25/2023   Chronic diabetic ulcer of left foot determined by examination (HCC) 04/25/2023   Diarrhea 03/01/2023   Intractable diarrhea 02/28/2023   Sinus tachycardia 02/17/2023   Cellulitis of heel, left 02/17/2023   Hx of BKA, right (HCC) 01/31/2023   Tardive dyskinesia 01/06/2023   Peutz-Jeghers polyps of small bowel (HCC) 12/24/2022   Gastric AVM 12/23/2022   Anemia of chronic disease 12/14/2022   Pressure injury of skin 11/30/2022   Non-pressure chronic ulcer of right heel and midfoot limited to breakdown of skin (HCC) 11/24/2022   ESRD on dialysis (HCC) 09/09/2022   Peripheral neuropathy 09/09/2022   (HFpEF) heart failure with preserved ejection fraction (HCC) 09/09/2022   CHF (congestive heart failure) (HCC) 07/13/2022   Hyponatremia 05/29/2022   Leukocytosis 05/11/2022   Edema of left upper extremity 03/21/2022   Acute pyelonephritis    Paroxysmal atrial fibrillation (HCC)    Chronic pain syndrome/chronic abdominal pain 09/04/2019   Malignant carcinoid tumor of duodenum (HCC)    NASH (nonalcoholic steatohepatitis) 06/05/2019   Chronic diarrhea with history of C.Diff    Restrictive lung disease secondary to obesity    History of gastric ulcer    Fibroid uterus 02/23/2019   Abdominal pain 07/17/2018   Anxiety 11/29/2017   Spinal stenosis, lumbar region with neurogenic claudication 08/03/2017   GERD (gastroesophageal reflux disease) 03/19/2017   Chronic depression/anxiety 03/19/2017   Morbid obesity (HCC)    Normocytic anemia 08/16/2016   Chronic gout 06/05/2016    Chronic hypokalemia 09/26/2015   Hypomagnesemia and hypokalemia 09/26/2015   Insulin  dependent type 2 diabetes mellitus (HCC) 05/25/2015   Essential hypertension 09/28/2013   Past Medical History:  Diagnosis Date   Acute back pain with sciatica, left    Acute back pain with sciatica, right    Acute encephalopathy 05/29/2022   Acute osteomyelitis of right calcaneus (HCC) 12/21/2022   Anemia, unspecified    Atrial fibrillation with RVR (HCC)-resolved 09/09/2022   Atypical chest pain 09/10/2021   Cancer (HCC)    Carcinoid tumor of duodenum    Chest pain with normal coronary angiography 2019   Chronic a-fib (  HCC) 09/09/2022   Chronic pain    Chronic systolic CHF (congestive heart failure) (HCC)    Dehiscence of amputation stump of right lower extremity (HCC) 01/29/2023   Diabetes mellitus    DKA (diabetic ketoacidosis) (HCC)    Drug-seeking behavior    21 hospitalizations and 14 CT a/p in 2 years for N/V and abdominal pain, demanding only IV dilaudid    Elevated troponin    chronic   Esophageal reflux    Fibromyalgia    Gastric ulcer    Gastroparesis    Gout    HCAP (healthcare-associated pneumonia) 06/19/2022   Hyperlipidemia    Hyperosmolar hyperglycemic state (HHS) (HCC) 05/11/2022   Hyperosmolar non-ketotic state due to type 2 diabetes mellitus (HCC) 05/11/2022   Hypertension    Hypomagnesemia    Lumbosacral stenosis    LVH (left ventricular hypertrophy)    Morbid obesity (HCC)    Nausea & vomiting 09/09/2022   NICM (nonischemic cardiomyopathy) (HCC)    PAF (paroxysmal atrial fibrillation) (HCC)    Sepsis (HCC) 11/23/2022   Stroke (HCC) 02/2011   Symptomatic anemia 12/14/2022   Thrombocytosis    Vitamin B12 deficiency anemia     Family History  Problem Relation Age of Onset   Diabetes Mother    Diabetes Father    Heart disease Father    Diabetes Sister    Congestive Heart Failure Sister 28   Diabetes Brother     Past Surgical History:  Procedure  Laterality Date   ABDOMINAL AORTOGRAM W/LOWER EXTREMITY N/A 11/29/2022   Procedure: ABDOMINAL AORTOGRAM W/LOWER EXTREMITY;  Surgeon: Pearline Norman RAMAN, MD;  Location: [facility];  Service: Cardiovascular;  Laterality: N/A;   AMPUTATION Right 12/29/2022   Procedure: RIGHT BELOW KNEE AMPUTATION;  Surgeon: Harden Jerona GAILS, MD;  Location: [facility] OR;  Service: Orthopedics;  Laterality: Right;   AV FISTULA PLACEMENT Left 06/30/2022   Procedure: LEFT BRACHIOCEPHALIC ARTERIOVENOUS (AV) FISTULA CREATION;  Surgeon: Gretta Lonni PARAS, MD;  Location: [facility] OR;  Service: Vascular;  Laterality: Left;   BIOPSY  07/27/2019   Procedure: BIOPSY;  Surgeon: Rosalie Kitchens, MD;  Location: [facility];  Service: Endoscopy;;   BIOPSY  07/30/2019   Procedure: BIOPSY;  Surgeon: Elicia Claw, MD;  Location: [facility];  Service: Gastroenterology;;   BIOPSY  04/28/2023   Procedure: BIOPSY;  Surgeon: Federico Rosario BROCKS, MD;  Location: [facility] ENDOSCOPY;  Service: Gastroenterology;;   CATARACT EXTRACTION  01/2014   CHOLECYSTECTOMY     COLONOSCOPY WITH PROPOFOL  N/A 07/30/2019   Procedure: COLONOSCOPY WITH PROPOFOL ;  Surgeon: Elicia Claw, MD;  Location: [facility];  Service: Gastroenterology;  Laterality: N/A;   ESOPHAGOGASTRODUODENOSCOPY N/A 07/27/2019   Procedure: ESOPHAGOGASTRODUODENOSCOPY (EGD);  Surgeon: Rosalie Kitchens, MD;  Location: [facility];  Service: Endoscopy;  Laterality: N/A;   ESOPHAGOGASTRODUODENOSCOPY N/A 07/26/2020   Procedure: ESOPHAGOGASTRODUODENOSCOPY (EGD);  Surgeon: Burnette Fallow, MD;  Location: [facility];  Service: Endoscopy;  Laterality: N/A;   ESOPHAGOGASTRODUODENOSCOPY (EGD) WITH PROPOFOL  N/A 08/02/2019   Procedure: ESOPHAGOGASTRODUODENOSCOPY (EGD) WITH PROPOFOL ;  Surgeon: Elicia Claw, MD;  Location: [facility];  Service: Gastroenterology;  Laterality: N/A;   ESOPHAGOGASTRODUODENOSCOPY (EGD) WITH PROPOFOL  N/A 12/23/2022   Procedure: ESOPHAGOGASTRODUODENOSCOPY (EGD) WITH PROPOFOL ;   Surgeon: San Sandor GAILS, DO;  Location: [facility];  Service: Gastroenterology;  Laterality: N/A;   ESOPHAGOGASTRODUODENOSCOPY (EGD) WITH PROPOFOL  N/A 04/28/2023   Procedure: ESOPHAGOGASTRODUODENOSCOPY (EGD) WITH PROPOFOL ;  Surgeon: Federico Rosario BROCKS, MD;  Location: [facility] ENDOSCOPY;  Service: Gastroenterology;  Laterality: N/A;   FISTULA SUPERFICIALIZATION Left 12/31/2022  Procedure: LEFT ARM FISTULA TRANSPOSITION;  Surgeon: Sheree Penne Bruckner, MD;  Location: [facility] OR;  Service: Vascular;  Laterality: Left;   GIVENS CAPSULE STUDY N/A 12/23/2022   Procedure: GIVENS CAPSULE STUDY;  Surgeon: San Sandor GAILS, DO;  Location: [facility];  Service: Gastroenterology;  Laterality: N/A;   HEMOSTASIS CLIP PLACEMENT  08/02/2019   Procedure: HEMOSTASIS CLIP PLACEMENT;  Surgeon: Elicia Claw, MD;  Location: [facility];  Service: Gastroenterology;;   HOT HEMOSTASIS N/A 12/23/2022   Procedure: HOT HEMOSTASIS (ARGON PLASMA COAGULATION/BICAP);  Surgeon: San Sandor GAILS, DO;  Location: [facility] ENDOSCOPY;  Service: Gastroenterology;  Laterality: N/A;   IR FLUORO GUIDE CV LINE RIGHT  06/24/2022   IR US  GUIDE VASC ACCESS RIGHT  06/24/2022   POLYPECTOMY  07/30/2019   Procedure: POLYPECTOMY;  Surgeon: Elicia Claw, MD;  Location: [facility];  Service: Gastroenterology;;   POLYPECTOMY  08/02/2019   Procedure: POLYPECTOMY;  Surgeon: Elicia Claw, MD;  Location: [facility];  Service: Gastroenterology;;   HARLEY DILATION N/A 04/28/2023   Procedure: SAVORY DILATION;  Surgeon: Federico Rosario BROCKS, MD;  [facility];  Service: Gastroenterology;  Laterality: N/A;   STUMP REVISION Right 02/02/2023   Procedure: REVISION RIGHT BELOW KNEE AMPUTATION;  Surgeon: Harden Jerona GAILS, MD;  Location: [facility] OR;  Service: Orthopedics;  Laterality: Right;   Social History   Occupational History   Not on file  Tobacco Use   Smoking status: Never   Smokeless tobacco: Never  Vaping Use   Vaping status: Never Used   Substance and Sexual Activity   Alcohol use: No   Drug use: No   Sexual activity: Not Currently    Birth control/protection: None

## 2023-09-24 IMAGING — CT CT ABD-PELV W/O CM
2 of 5 series · 15 of 46 positions shown, 17 images · non-contrast
Comparison: 05/30/2021.

CLINICAL DATA: Abdominal pain, acute, nonlocalized.



[Series 3: a/p w/o 5mm · axial · non-contrast · 0.78mm/px · z∈[+1263,+1668]mm · 12 of 91 slices shown, 14 images]
[im 5/91  soft-tissue]
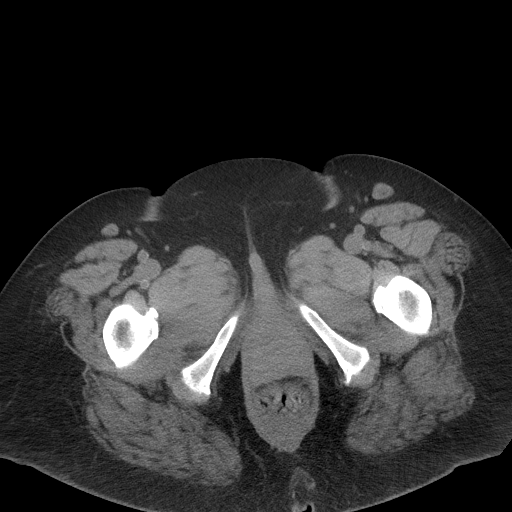
[im 5/91  bone]
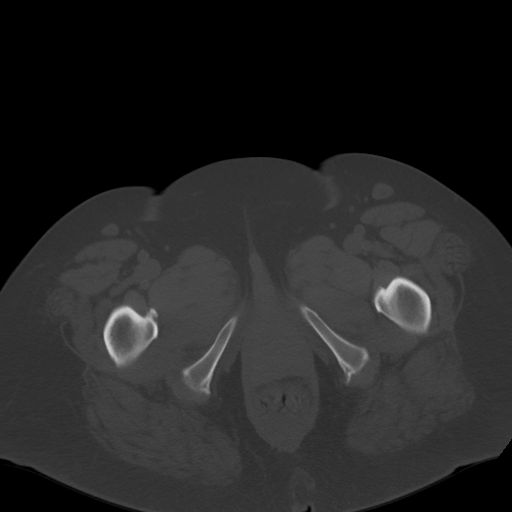
[im 13/91  soft-tissue]
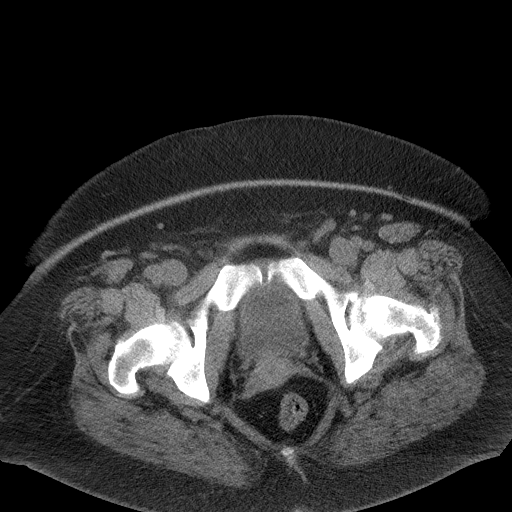
[im 22/91  soft-tissue]
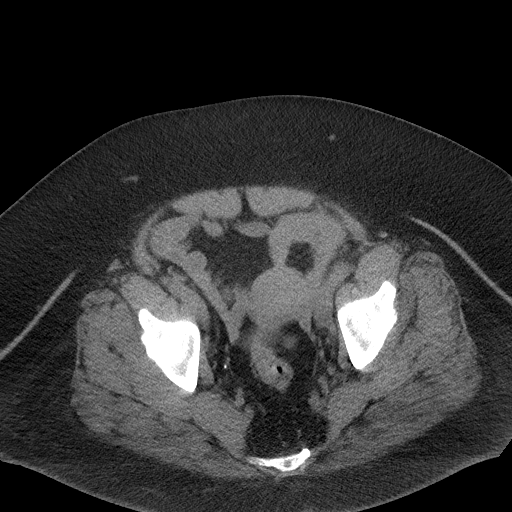
[im 26/91  soft-tissue]
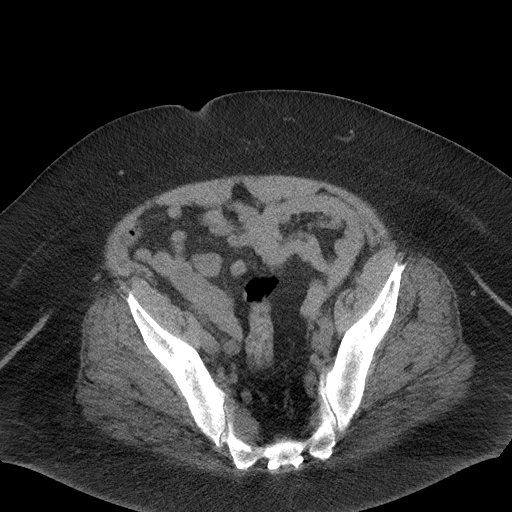
[im 35/91  soft-tissue]
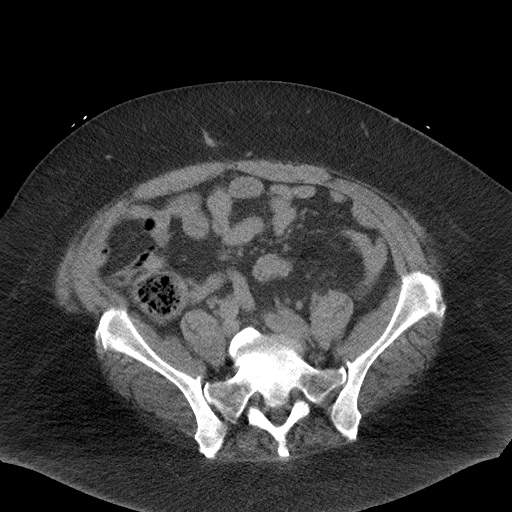
[im 43/91  soft-tissue]
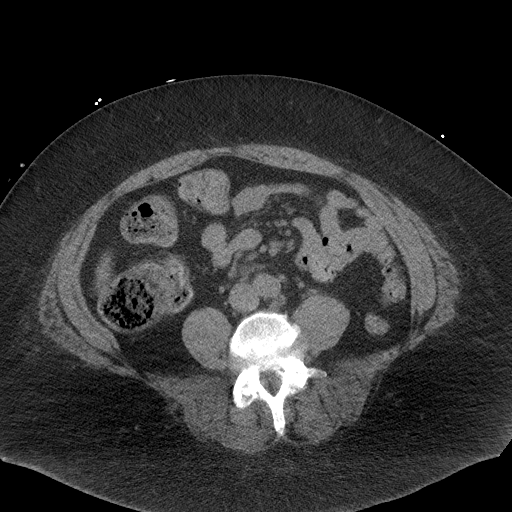
[im 48/91  soft-tissue]
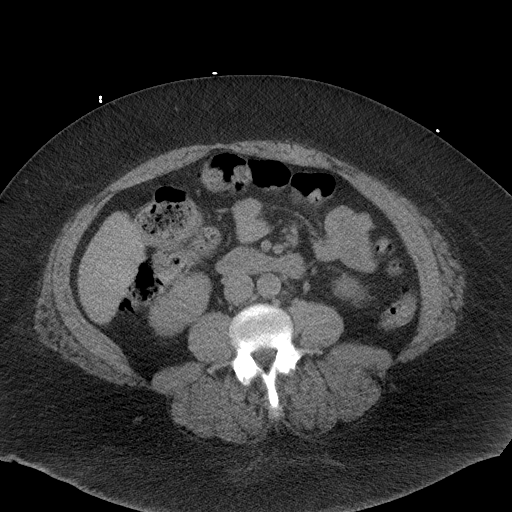
[im 56/91  soft-tissue]
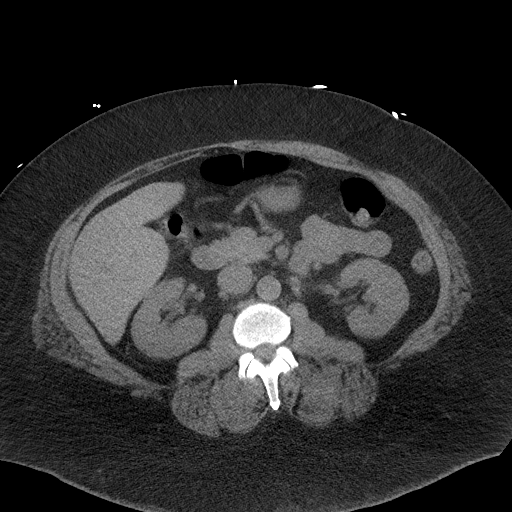
[im 65/91  soft-tissue]
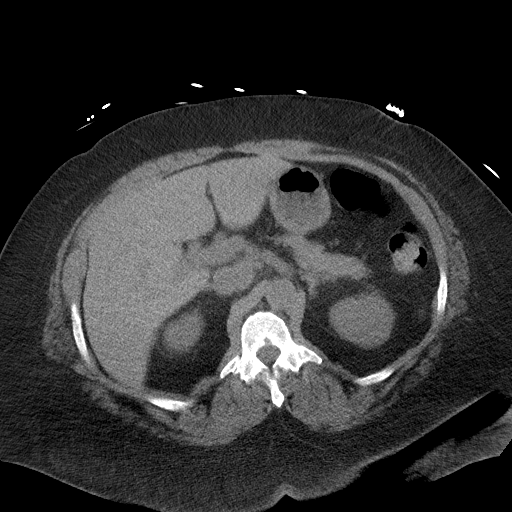
[im 65/91  bone]
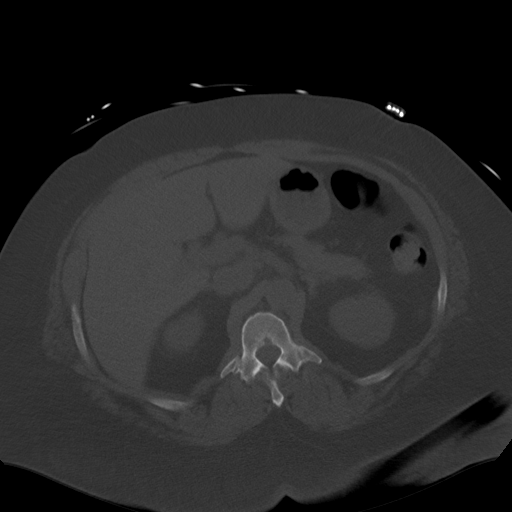
[im 69/91  soft-tissue]
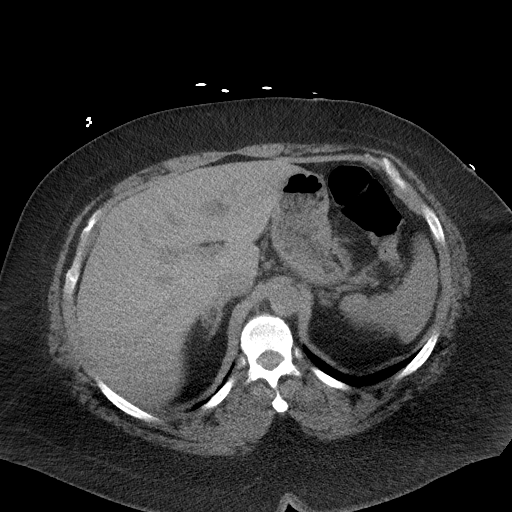
[im 78/91  soft-tissue]
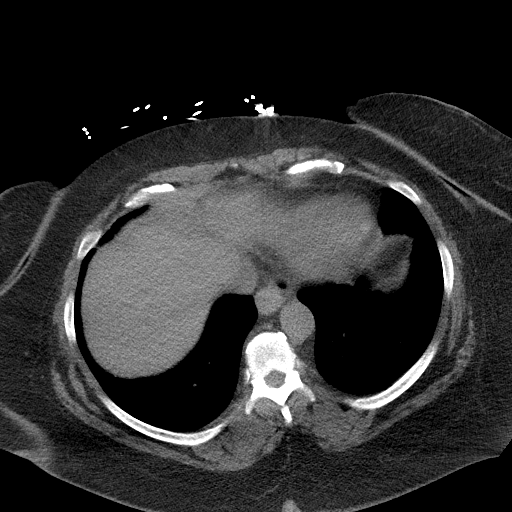
[im 86/91  soft-tissue]
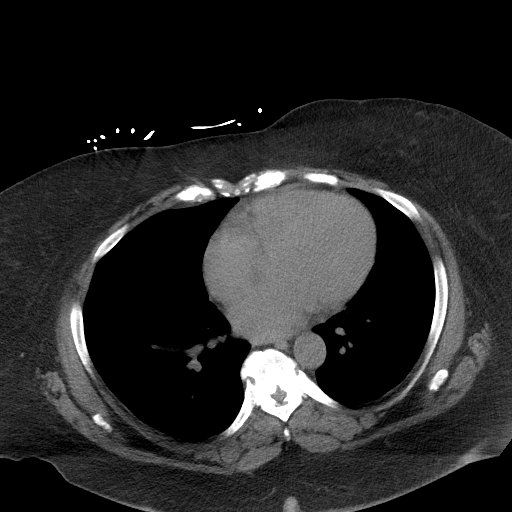

[Series 7: a/p w/o cor · coronal · non-contrast · 0.89mm/px · 3 of 156 slices shown]
[im 52/156  soft-tissue]
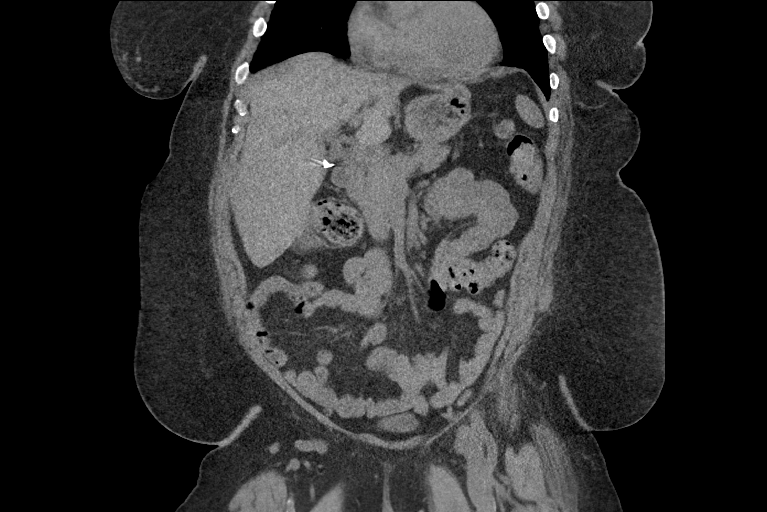
[im 69/156  soft-tissue]
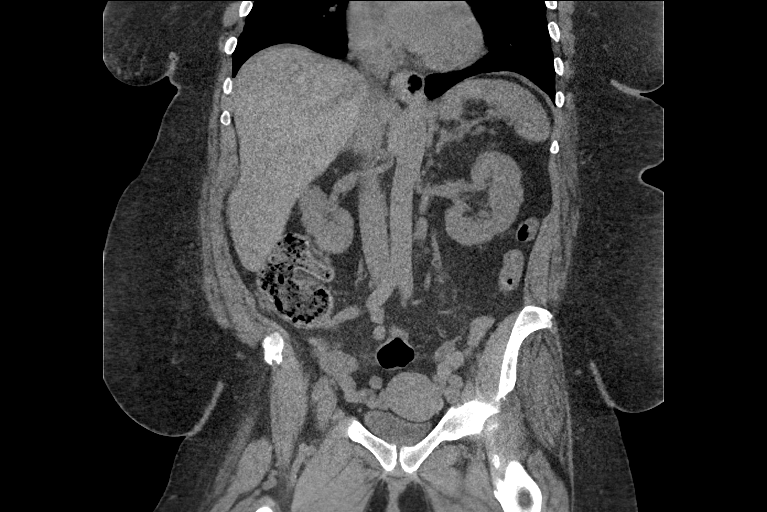
[im 87/156  soft-tissue]
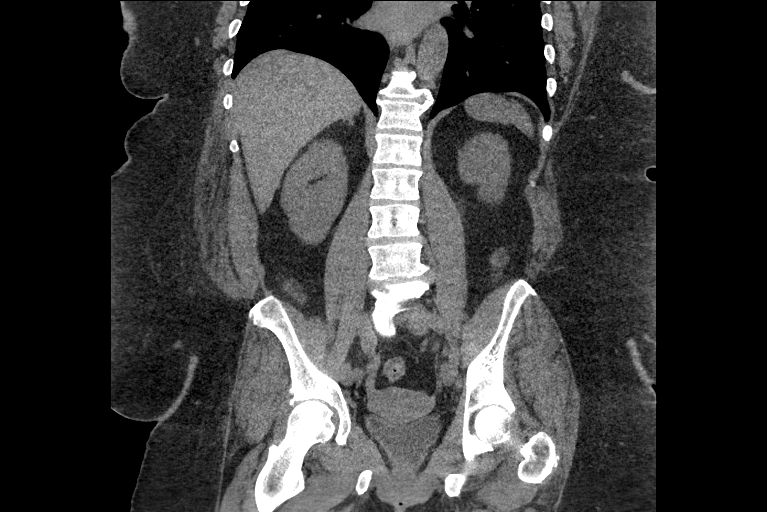

[15 of 46 positions shown; findings below may reference images not displayed]

FINDINGS: Lower chest: No acute abnormality.

Hepatobiliary: No focal liver abnormality is seen. Status post
cholecystectomy. No biliary dilatation.

Pancreas: Unremarkable. No pancreatic ductal dilatation or
surrounding inflammatory changes.

Spleen: Normal in size without focal abnormality.

Adrenals/Urinary Tract: Adrenal glands are unremarkable. Kidneys are
normal, without renal calculi, focal lesion, or hydronephrosis.
Bladder is unremarkable.

Stomach/Bowel: A small hiatal hernia is noted. The stomach is
otherwise within normal limits. Appendix appears normal. No
evidence of bowel wall thickening, distention, or inflammatory
changes. No free air or pneumatosis.

Vascular/Lymphatic: Aortic atherosclerosis. No enlarged abdominal or
pelvic lymph nodes by size criteria.

Reproductive: Uterus and bilateral adnexa are unremarkable.

Other: No abdominopelvic ascites. There is inferior displacement of
the pelvic for contents below the pubococcygeal line compatible with
pelvic floor dysfunction.

Musculoskeletal: Degenerative changes in the thoracolumbar spine. No
acute osseous abnormality.
IMPRESSION: No acute intra-abdominal process.

## 2023-09-24 IMAGING — CR DG CHEST 1V
1 series · 1 of 1 positions shown · non-contrast
Comparison: None Available.

CLINICAL DATA: Triage notes:"Pt to ED via [REDACTED] from home. Per pt
she has been sleeping all day and her mom said she was out of it
because she kept dropping stuff so her mom called EMS. EMS reports
pt fsbs=[id number]. Pt going to sleep during [place].*comment was
truncated*[id number]

EXAM:
CHEST  1 VIEW

[chest ap]
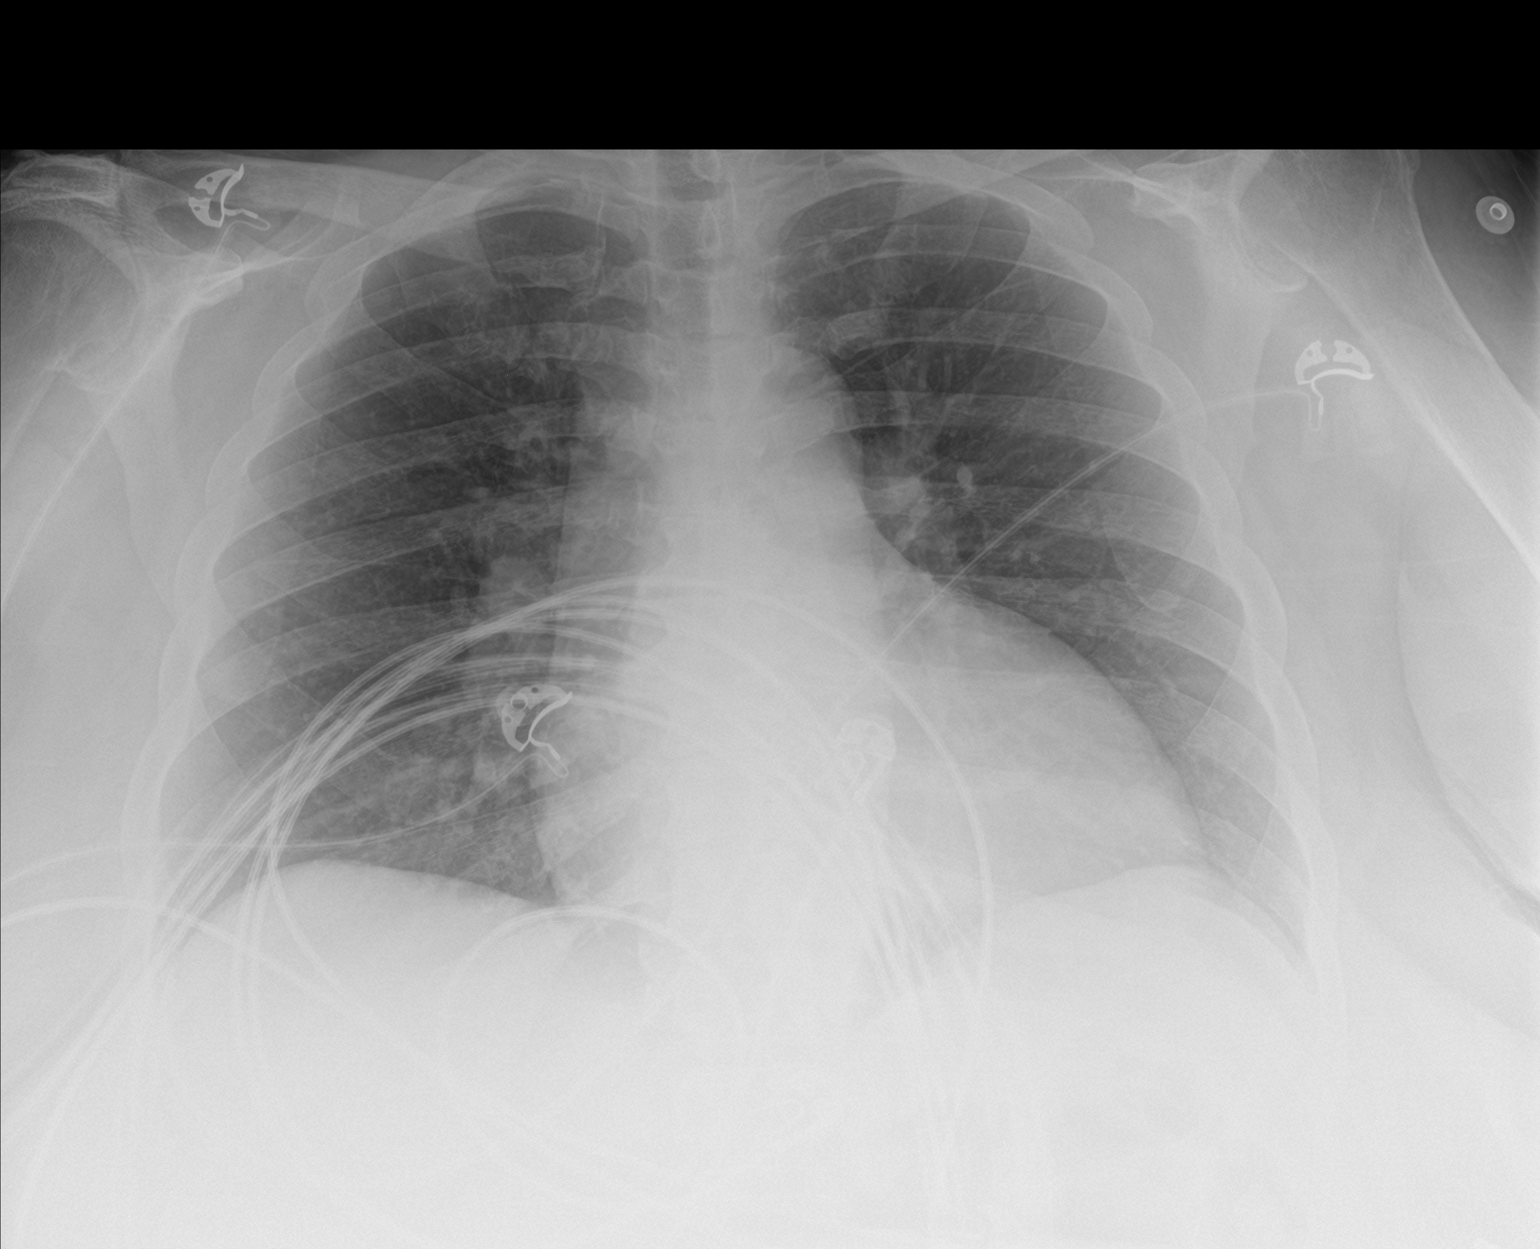

[1 of 1 positions shown; findings below may reference images not displayed]

FINDINGS: Normal mediastinum and cardiac silhouette. Normal pulmonary
vasculature. No evidence of effusion, infiltrate, or pneumothorax.
No acute bony abnormality.
IMPRESSION: No acute cardiopulmonary process.

## 2023-09-25 IMAGING — US US RENAL
1 series · 14 of 21 positions shown · non-contrast
Comparison: Abdominal CT from yesterday

CLINICAL DATA: Acute kidney injury

EXAM:
RENAL / URINARY TRACT ULTRASOUND COMPLETE

[Series 1: us renal · 14 of 21 slices shown]
[im 1/21]
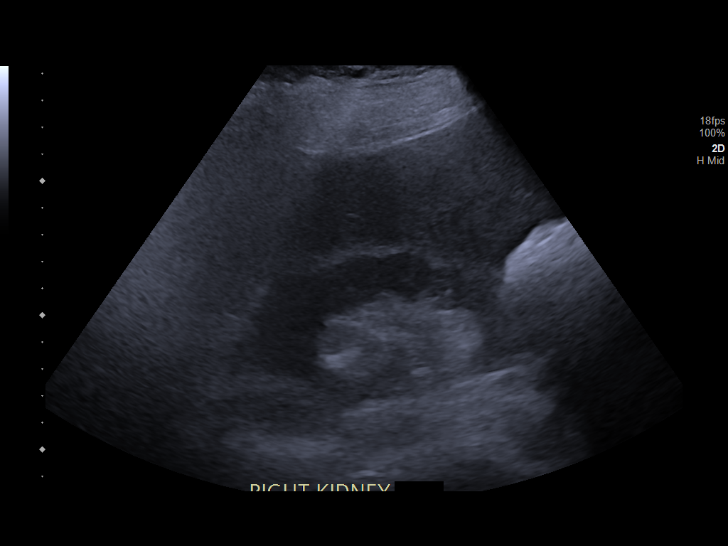
[im 3/21]
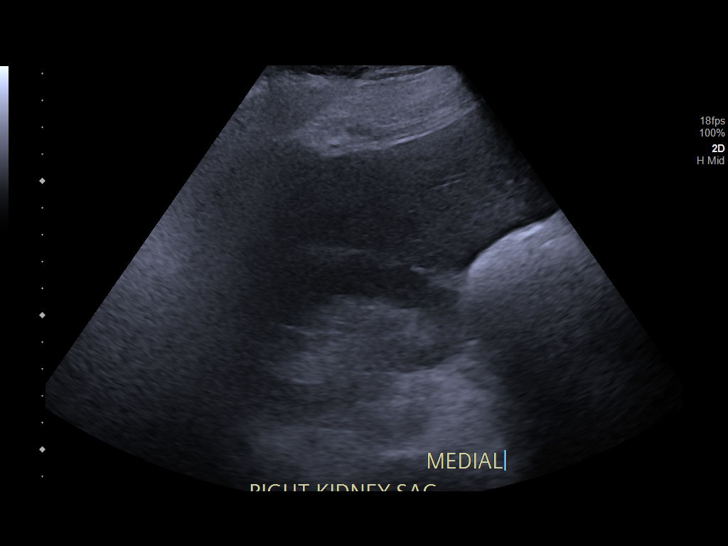
[im 4/21]
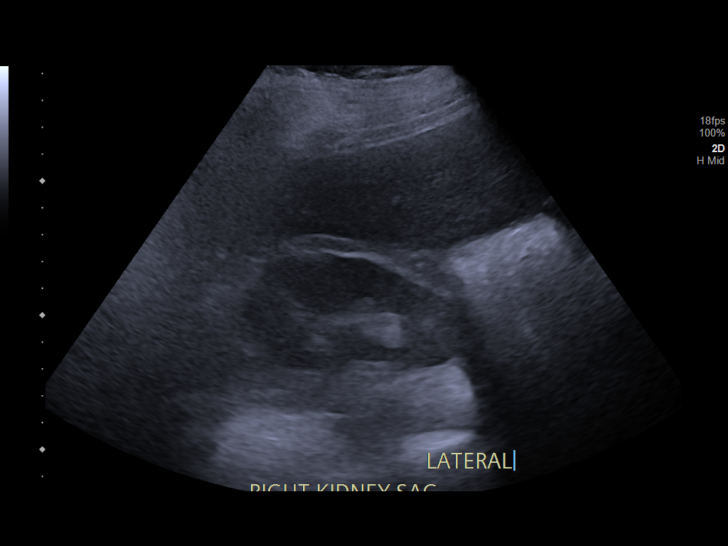
[im 6/21]
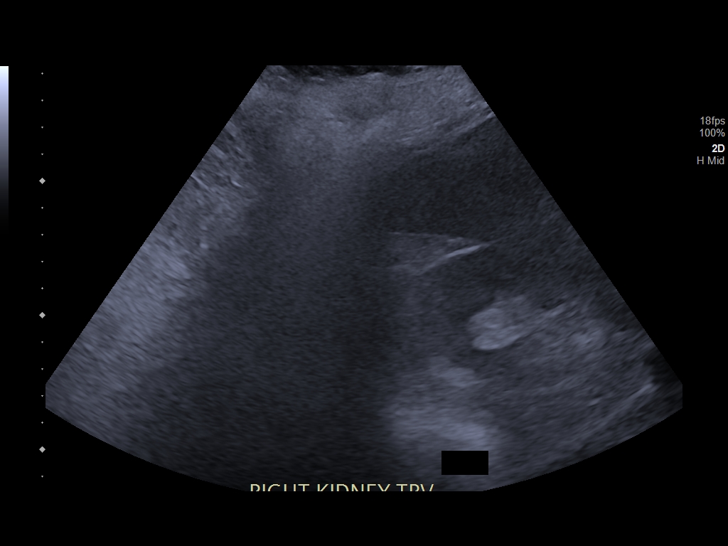
[im 7/21]
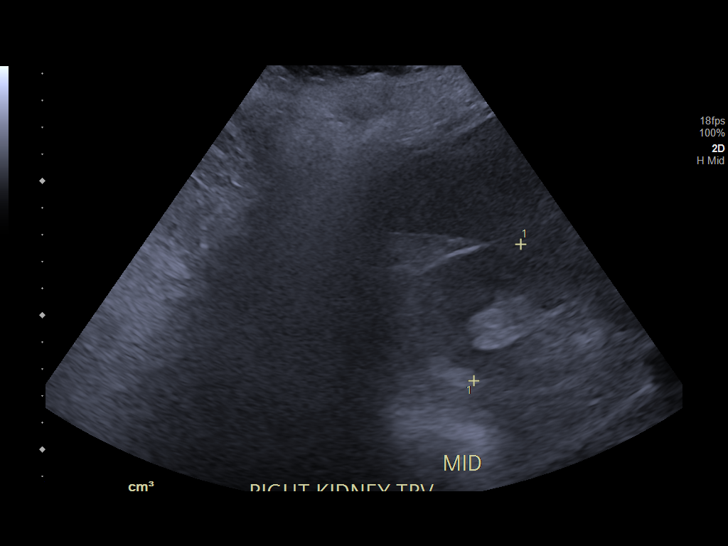
[im 9/21]
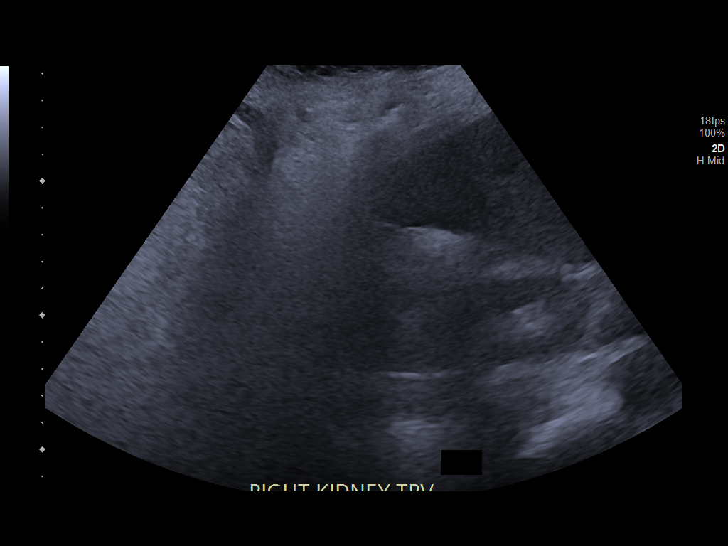
[im 10/21]
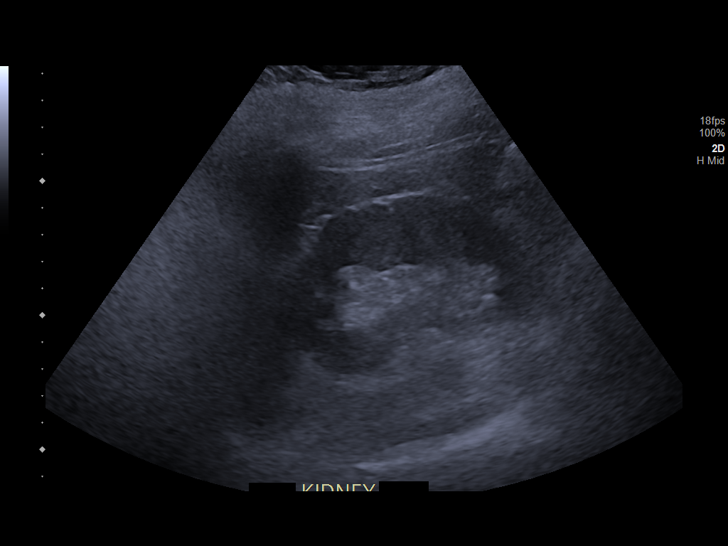
[im 12/21]
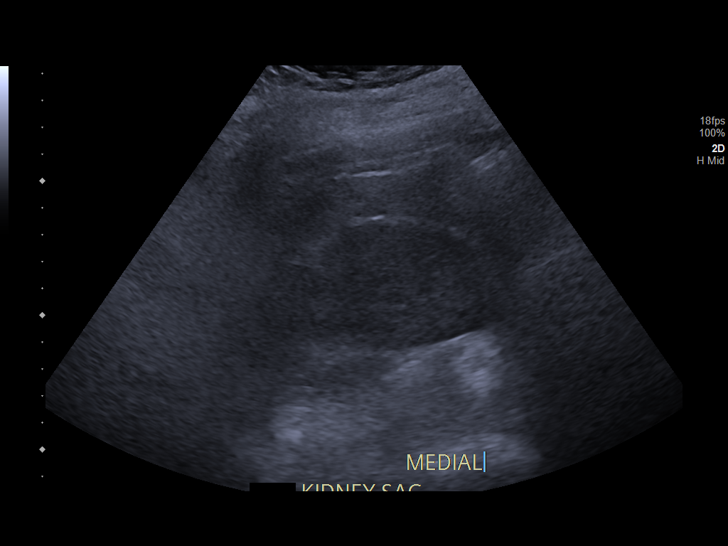
[im 13/21]
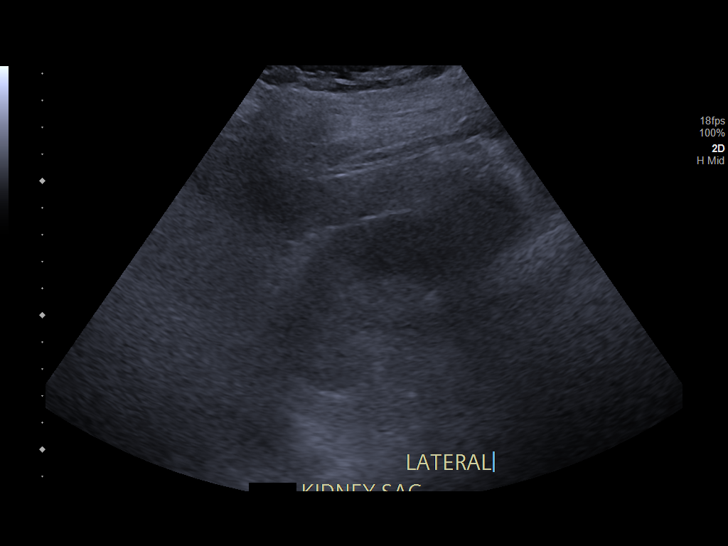
[im 15/21]
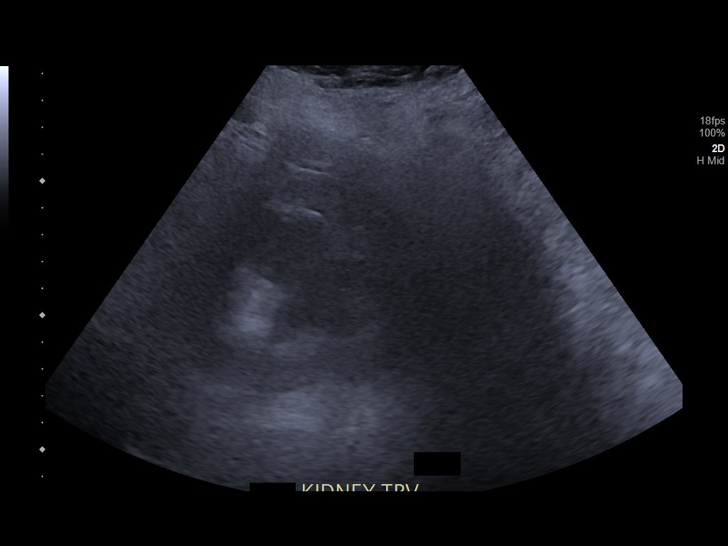
[im 16/21]
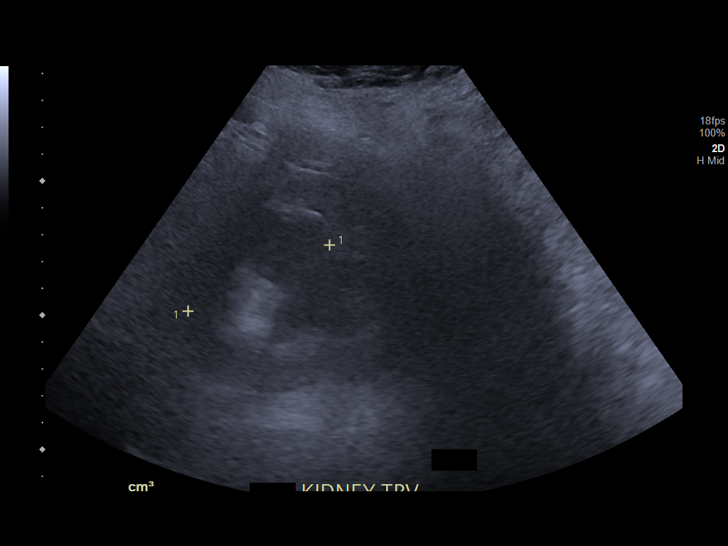
[im 18/21]
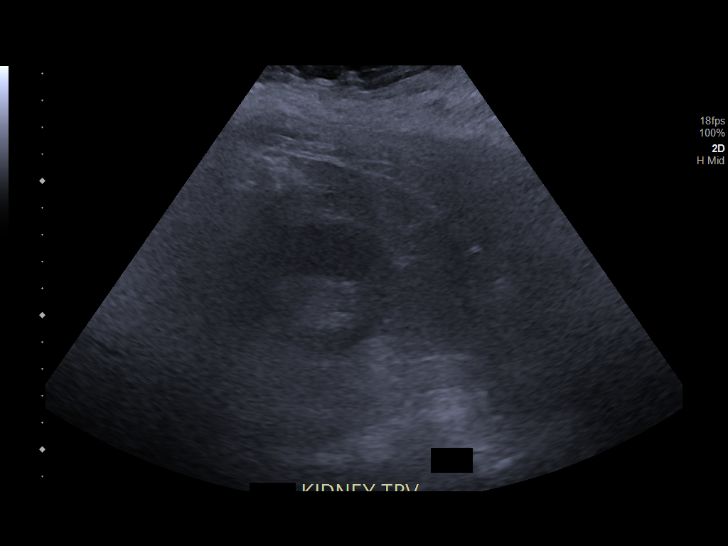
[im 19/21]
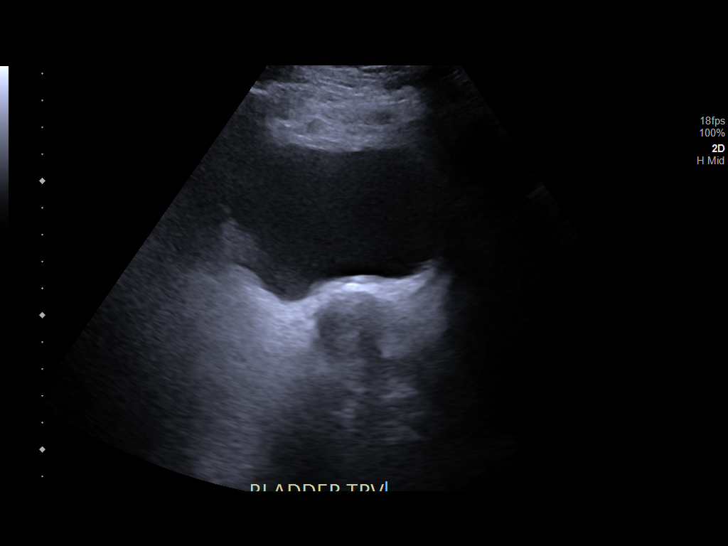
[im 21/21]
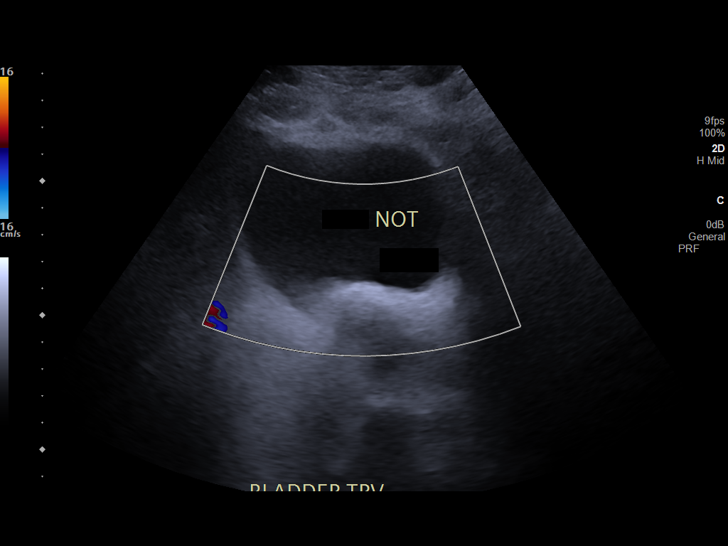

[14 of 21 positions shown; findings below may reference images not displayed]

FINDINGS: Right Kidney:

Renal measurements: 12 x 5 x 5.4 cm = volume: 170 mL. Echogenicity
within normal limits. No mass or hydronephrosis visualized.

Left Kidney:

Renal measurements: 11 x 5 x 5.8 cm = volume: 170 mL. Echogenicity
within normal limits. No mass or hydronephrosis visualized.

Bladder:

Appears normal for degree of bladder distention.
IMPRESSION: Negative renal ultrasound.

## 2023-09-27 IMAGING — CT CT ABD-PELV W/O CM
2 of 4 series · 16 of 46 positions shown, 18 images · non-contrast
Comparison: Numerous prior CTs. The 2 most recent are CTs without
contrast 08/15/2021 and 05/30/2021.

CLINICAL DATA: Abdominal pain.



[Series 3: ap without · axial · non-contrast · 0.98mm/px · z∈[+967,+1402]mm · 13 of 99 slices shown, 15 images]
[im 6/99  soft-tissue]
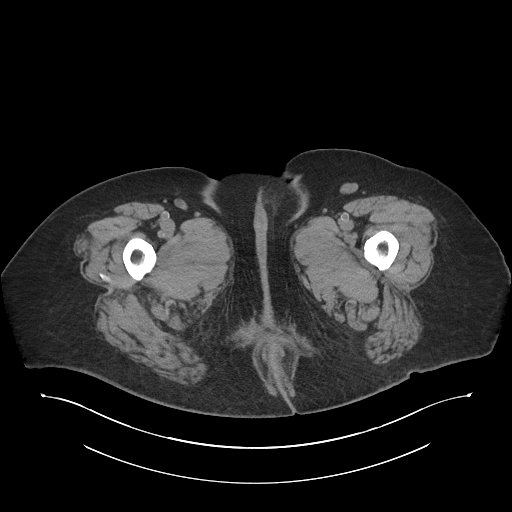
[im 6/99  bone]
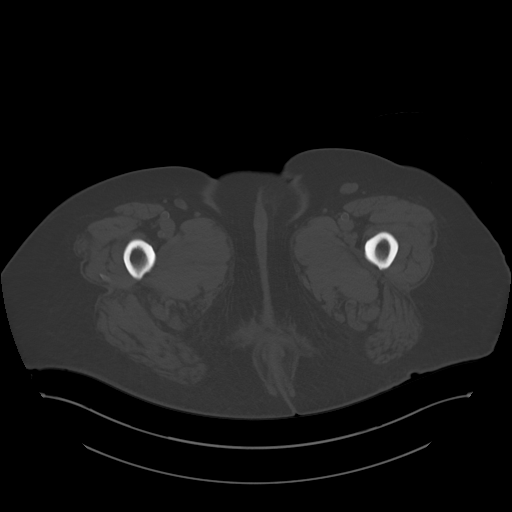
[im 11/99  soft-tissue]
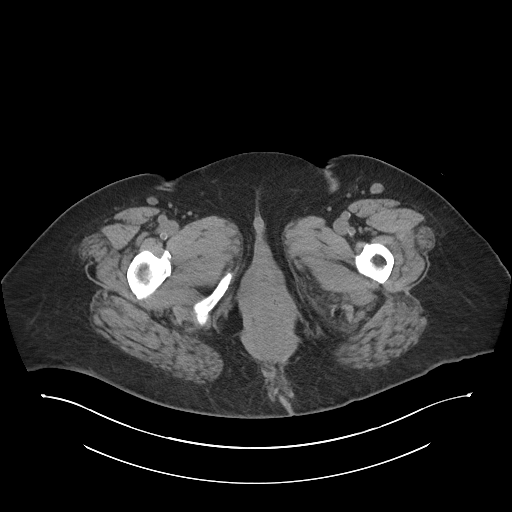
[im 22/99  soft-tissue]
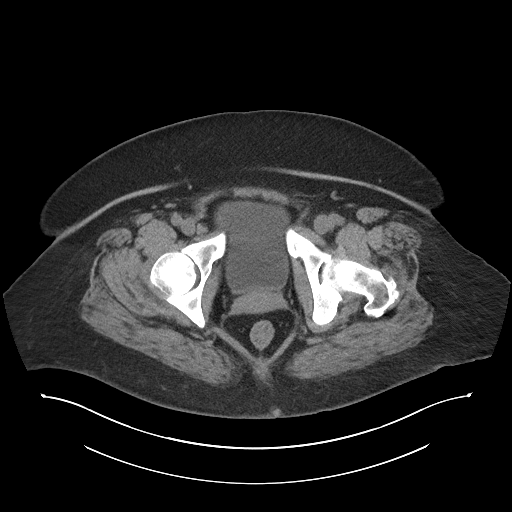
[im 28/99  soft-tissue]
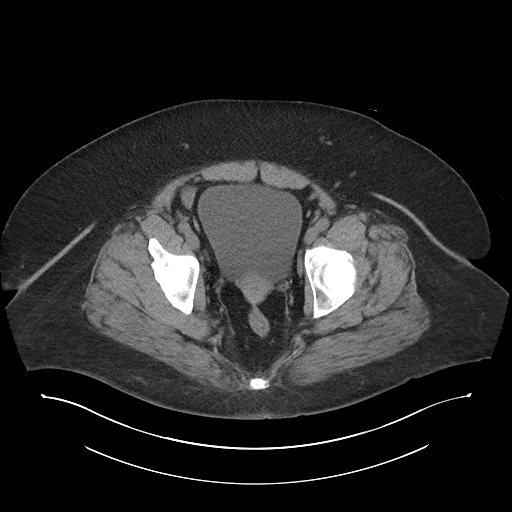
[im 33/99  soft-tissue]
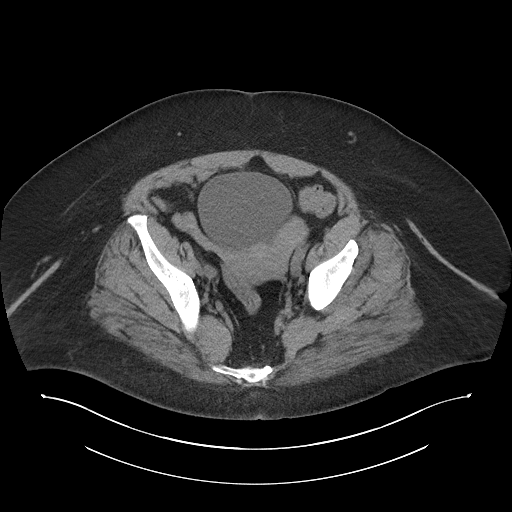
[im 44/99  soft-tissue]
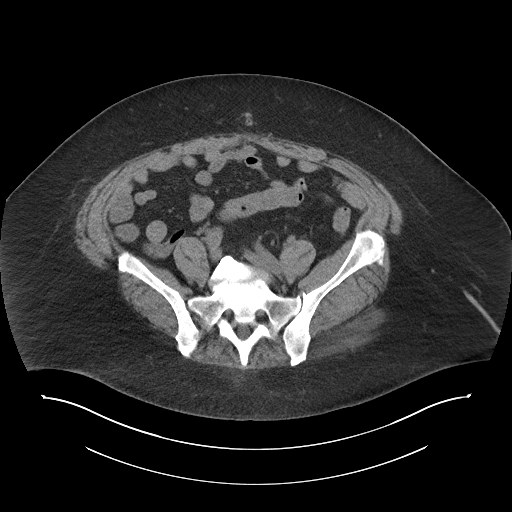
[im 50/99  soft-tissue]
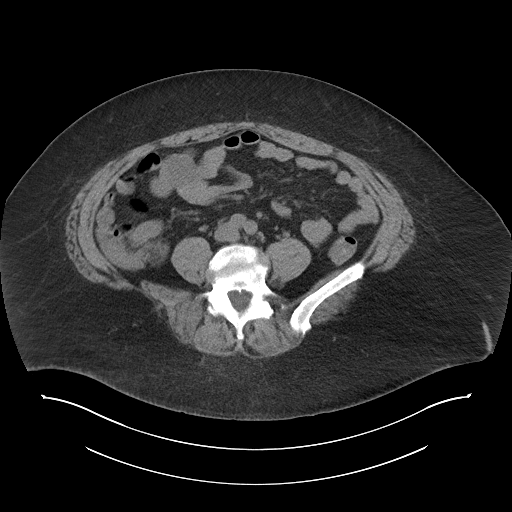
[im 55/99  soft-tissue]
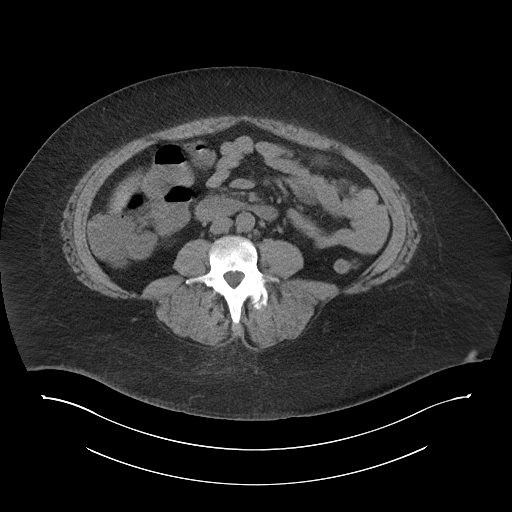
[im 66/99  soft-tissue]
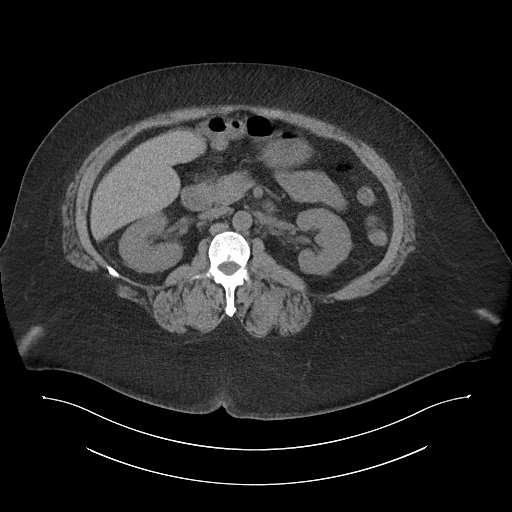
[im 66/99  bone]
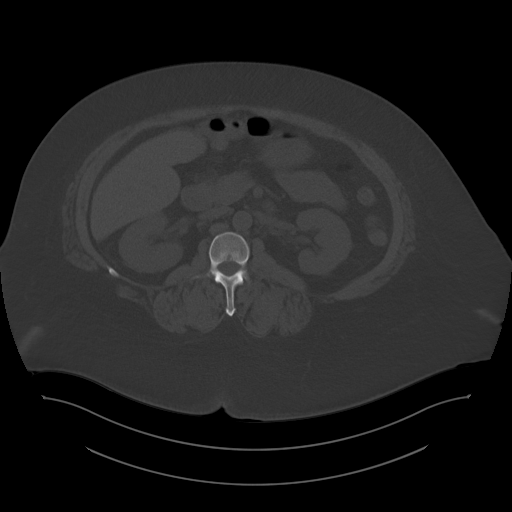
[im 71/99  soft-tissue]
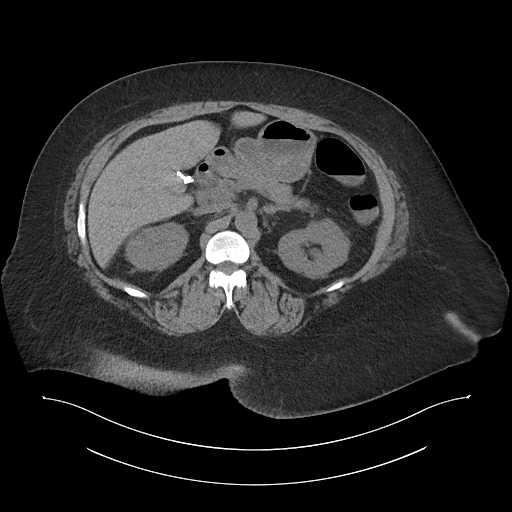
[im 77/99  soft-tissue]
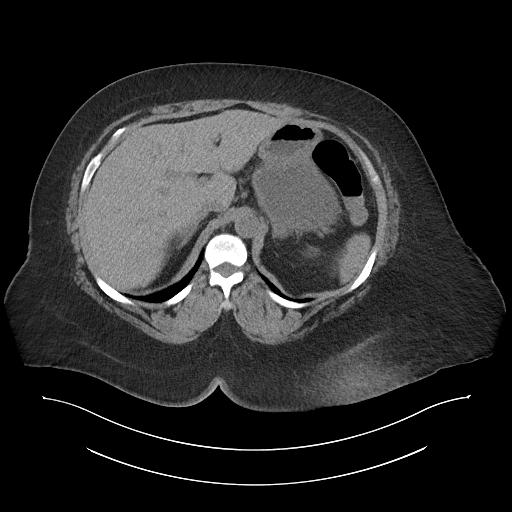
[im 88/99  soft-tissue]
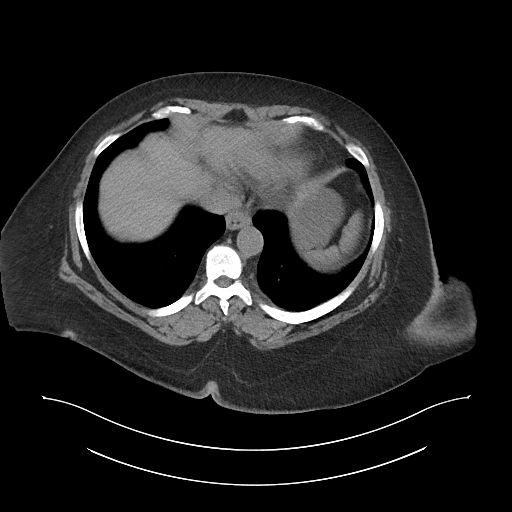
[im 93/99  soft-tissue]
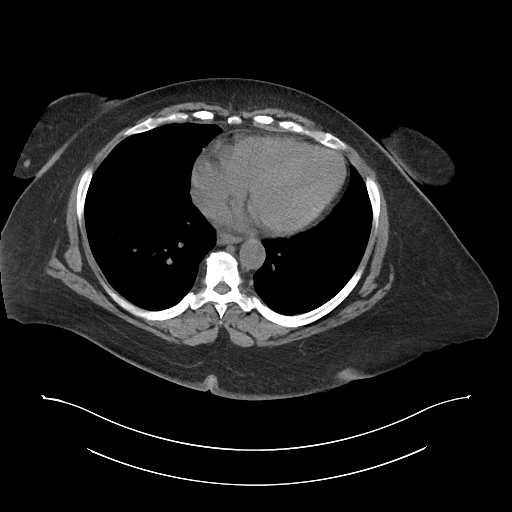

[Series 6: cor · coronal · 0.98mm/px · 3 of 118 slices shown]
[im 40/118  soft-tissue]
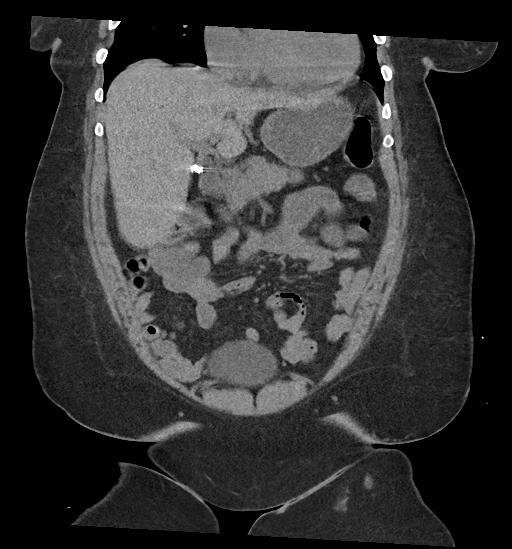
[im 53/118  soft-tissue]
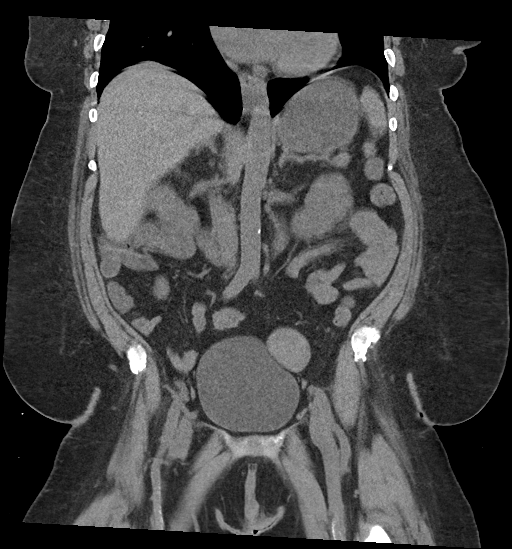
[im 66/118  soft-tissue]
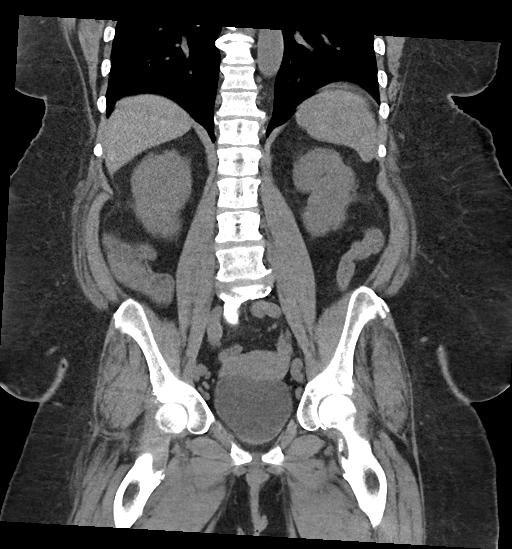

[16 of 46 positions shown; findings below may reference images not displayed]

FINDINGS: Lower chest: No acute abnormality.

Hepatobiliary: 18.6 cm in length, unremarkable without contrast.
Surgically absent gallbladder again noted without biliary
dilatation.

Pancreas: No focal abnormality without contrast. No ductal
dilatation.

Spleen: Unremarkable without contrast although somewhat limited
visualization due to motion.

Adrenals/Urinary Tract: There is no adrenal mass no focal
abnormality in the unenhanced renal cortex. Perinephric stranding
does appear slightly increased today which could be due to
inflammation or changes in fluid balance. There is no evidence of
urinary stones or obstruction. There is no bladder thickening.

Stomach/Bowel: Small hiatal hernia. No dilatation or wall thickening
is seen including the appendix. There is fluid in the colon.
Uncomplicated sigmoid diverticula.

Vascular/Lymphatic: Aortic atherosclerosis. No enlarged abdominal or
pelvic lymph nodes.

Reproductive: The uterus is intact. Stable 5 cm fibroid of the left
uterine fundus. No other focal adnexal abnormality. The ovaries are
not enlarged. Scattered pelvic phleboliths.

Other: Small umbilical fat hernia. No incarcerated hernia. No free
air, hemorrhage or fluid.

Musculoskeletal: Advanced L4-5 facet hypertrophy with grade 1 L4-5
degenerative anterolisthesis and moderate acquired spinal stenosis.
There are prominent anterolateral bridging osteophytes to the right
at L5-S1, foraminal stenosis L4-5 and L5-S1.
IMPRESSION: 1. Bilateral increased perinephric stranding, could be due to
changes in fluid balance or inflammatory/infectious process such as
pyelonephritis. The bladder wall is normal in thickness.
2. Fluid in the colon. This could indicate evidence of mild colitis
but there is no wall thickening or inflammatory change.
3. Aortic atherosclerosis and additional chronic findings.

## 2023-09-30 IMAGING — US US RENAL
1 series · 14 of 21 positions shown · non-contrast
Comparison: 08/16/2021

CLINICAL DATA: Acute kidney injury

EXAM:
RENAL / URINARY TRACT ULTRASOUND COMPLETE

[Series 1: us renal · 14 of 21 slices shown]
[im 1/21]
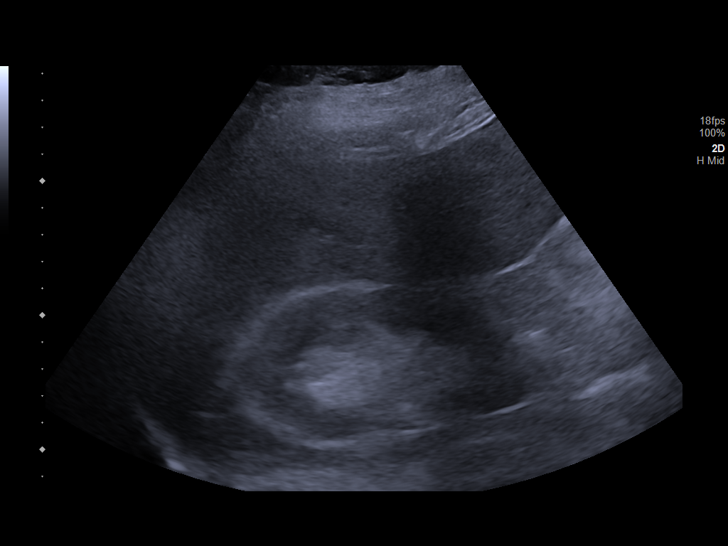
[im 3/21]
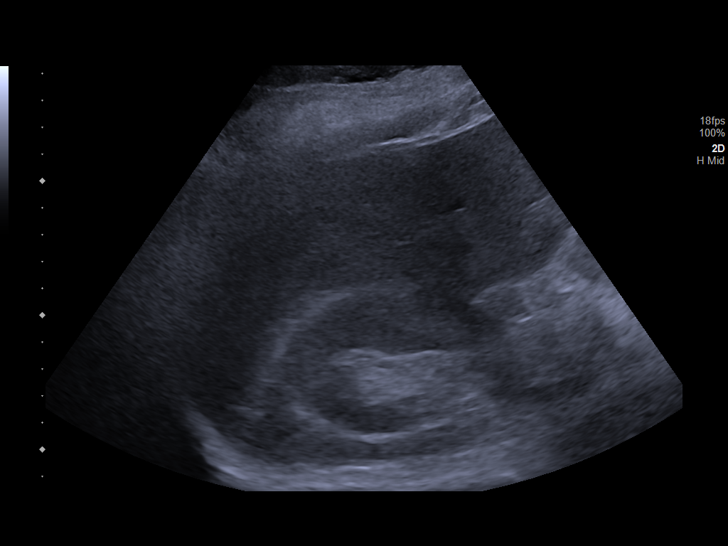
[im 4/21]
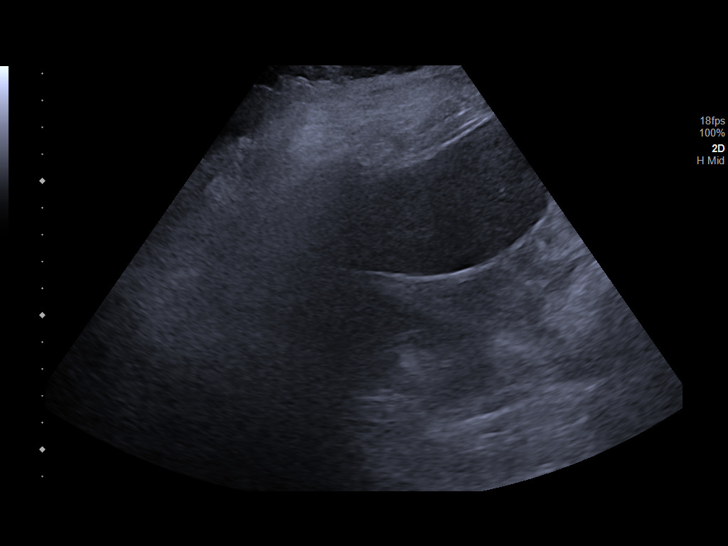
[im 6/21]
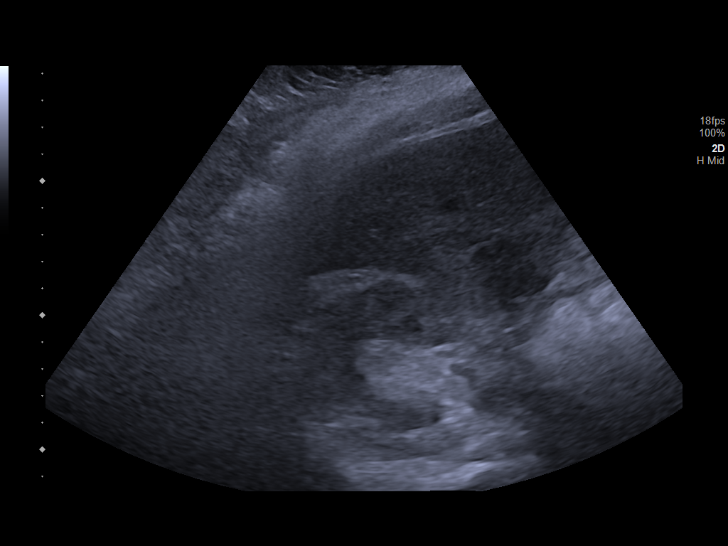
[im 7/21]
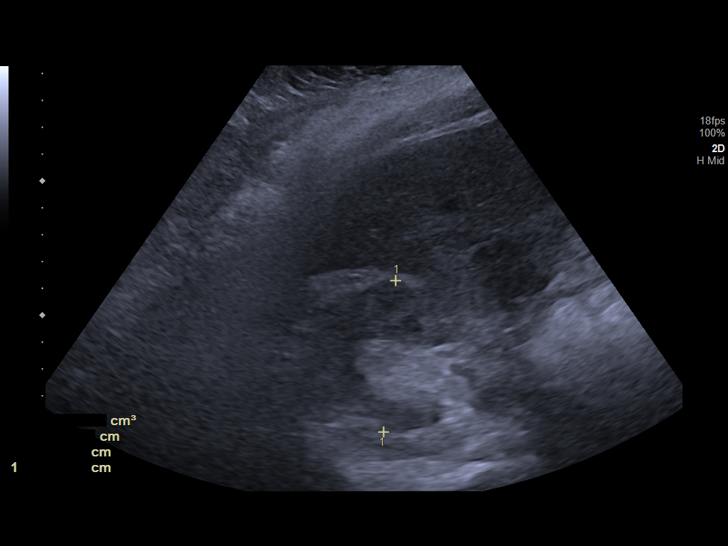
[im 9/21]
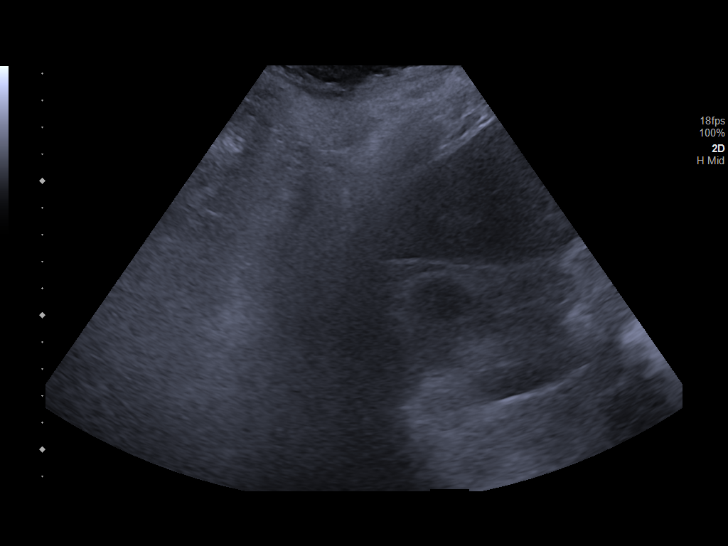
[im 10/21]
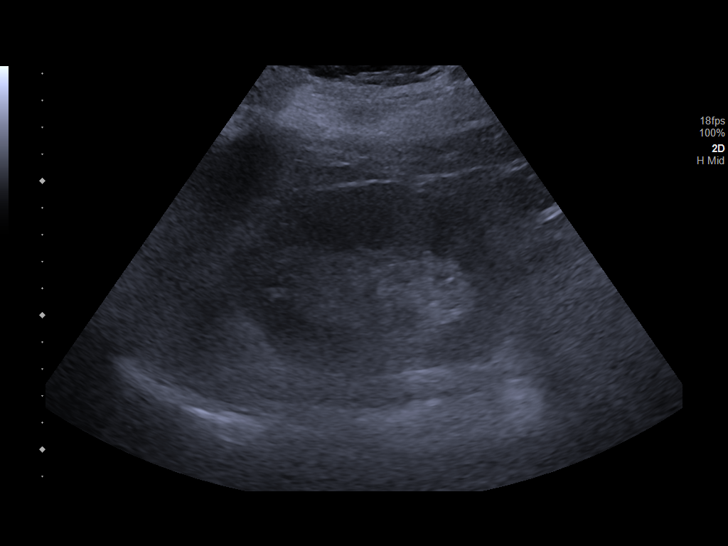
[im 12/21]
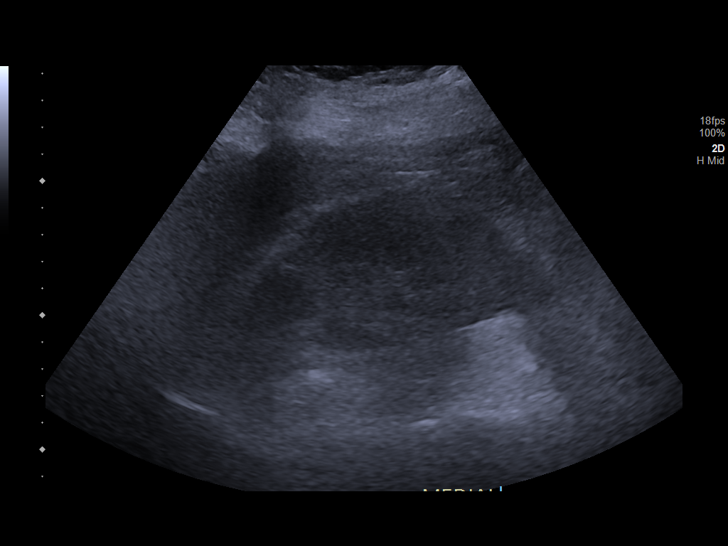
[im 13/21]
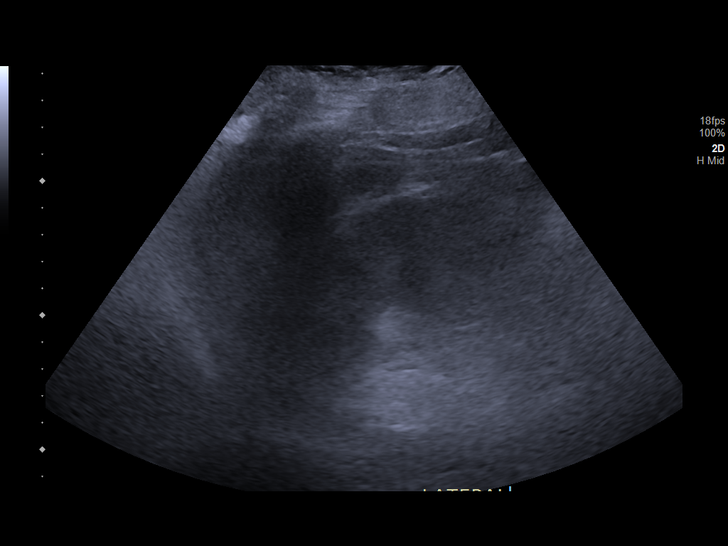
[im 15/21]
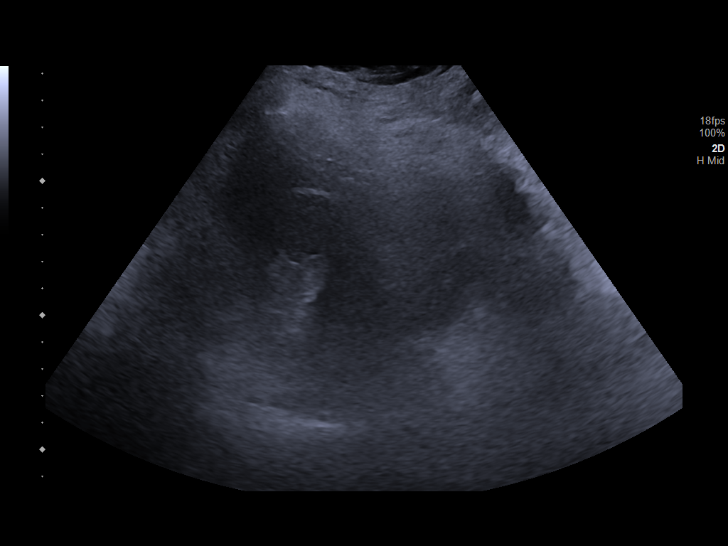
[im 16/21]
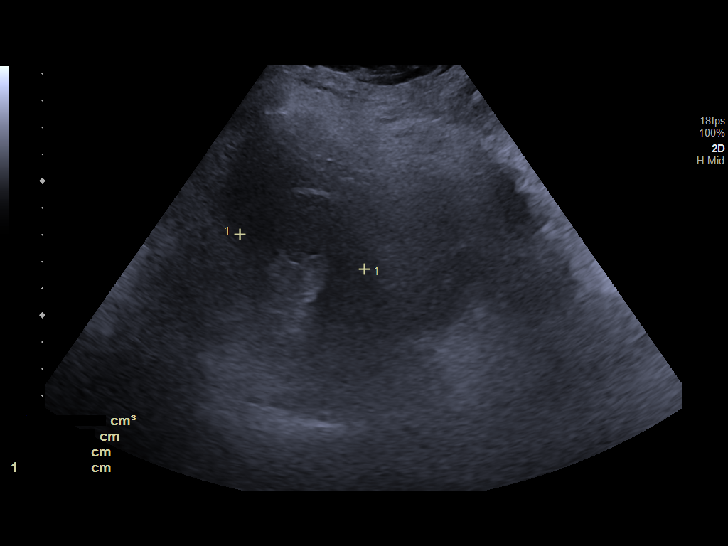
[im 18/21]
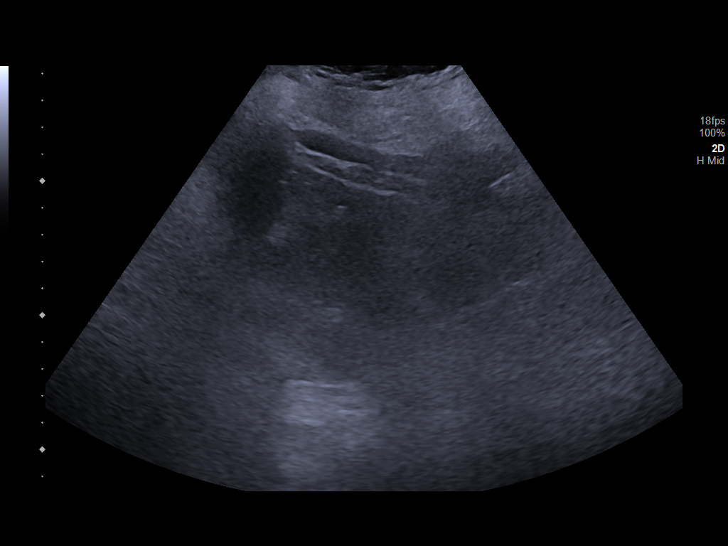
[im 19/21]
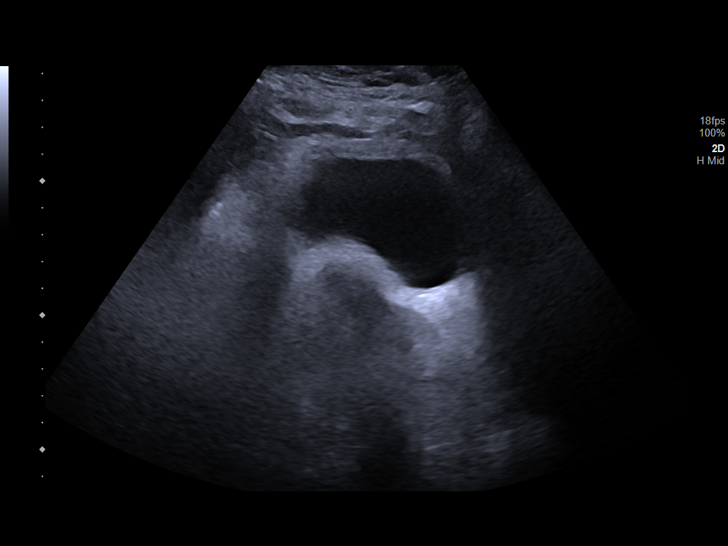
[im 21/21]
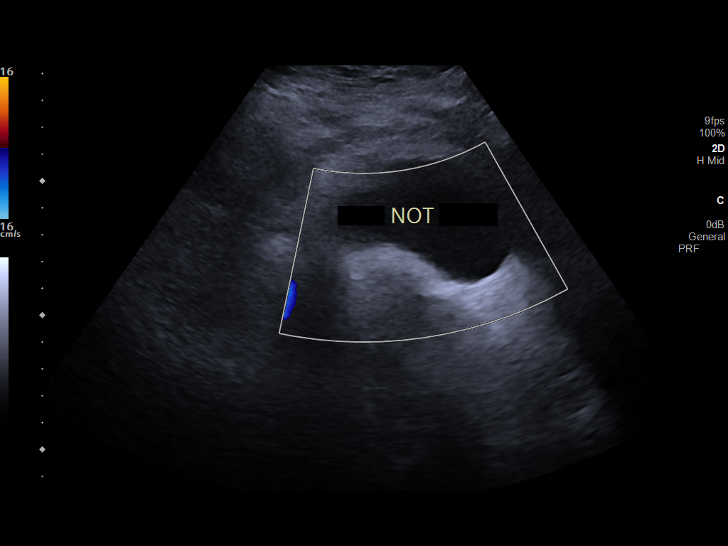

[14 of 21 positions shown; findings below may reference images not displayed]

FINDINGS: Right Kidney:

Renal measurements: 11.4 x 5.6 x 5.7 cm = volume: 189 mL.
Echogenicity within normal limits. No mass or hydronephrosis
visualized.

Left Kidney:

Renal measurements: 11.4 x 7.1 x 4.8 cm = volume: 204 mL.
Echogenicity within normal limits. No mass or hydronephrosis
visualized.

Bladder:

Appears normal for degree of bladder distention.

Other:

None.
IMPRESSION: No evidence of obstructive uropathy.

## 2023-10-01 IMAGING — DX DG CHEST 1V PORT
1 series · 1 of 1 positions shown · non-contrast
Comparison: 08/15/2021

CLINICAL DATA: Acute kidney injury

EXAM:
PORTABLE CHEST 1 VIEW

[chest ap]
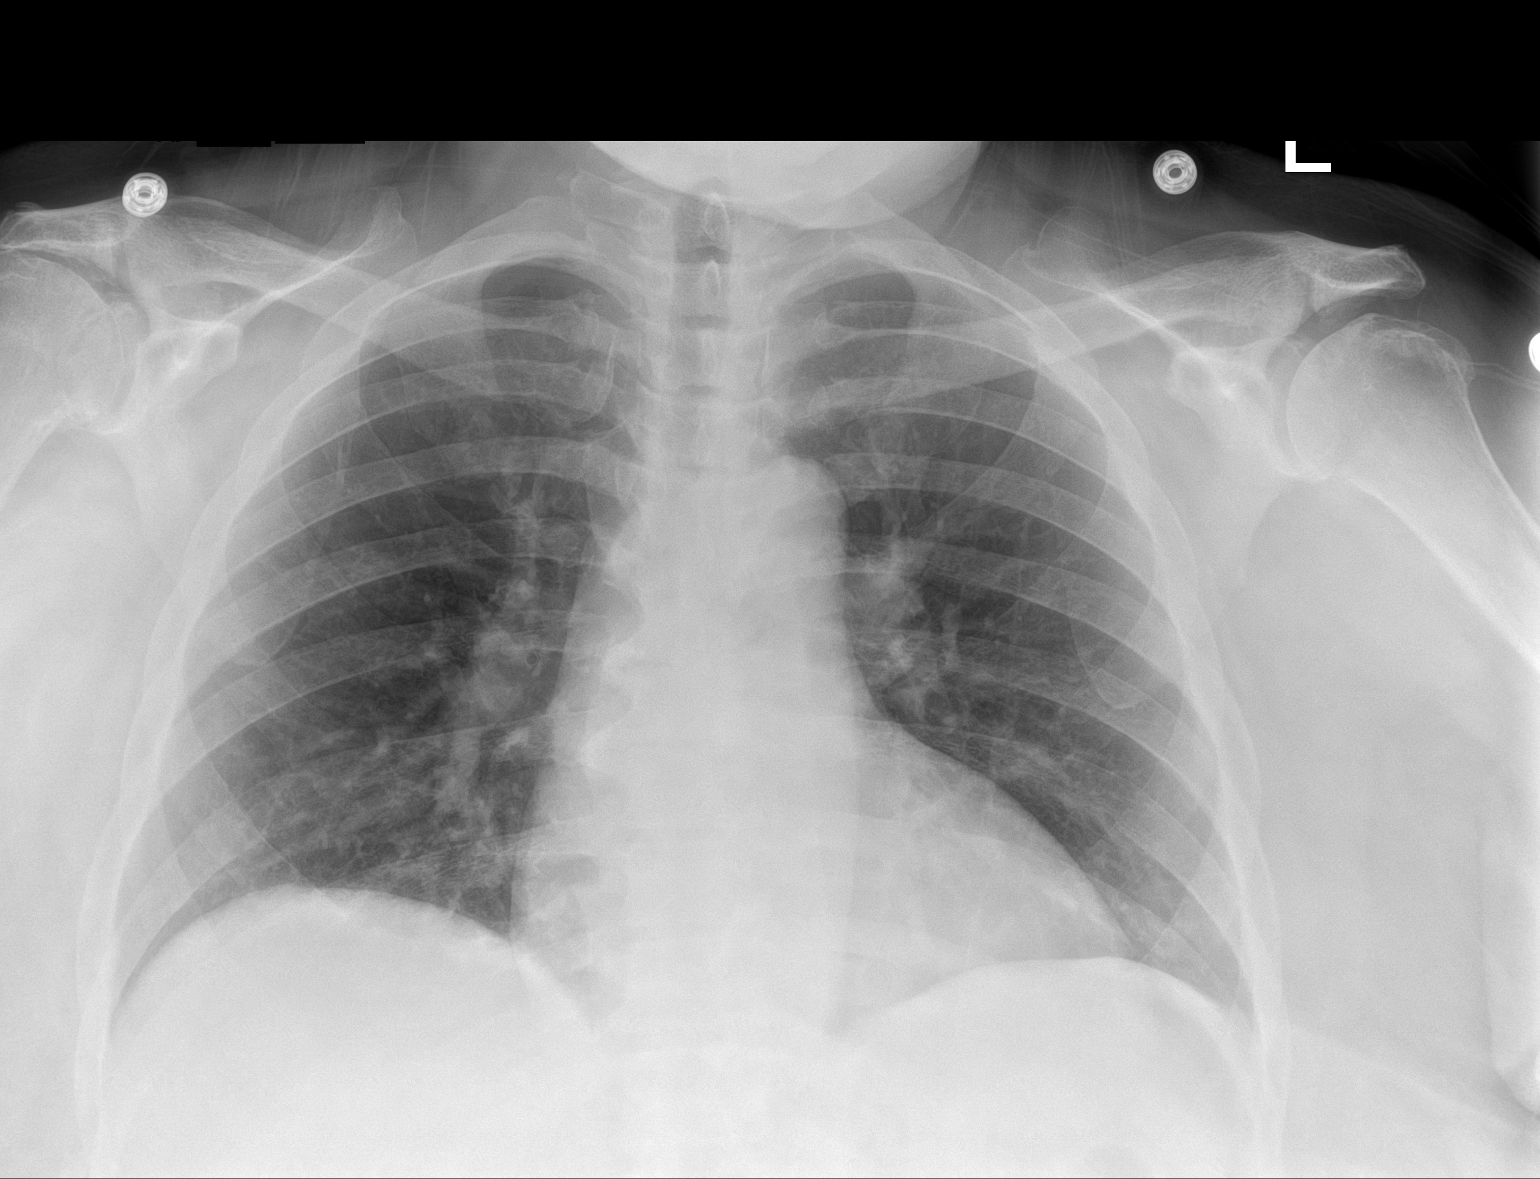

[1 of 1 positions shown; findings below may reference images not displayed]

FINDINGS: Shallow inspiration. Heart size and pulmonary vascularity are
normal. Lungs are clear. No pleural effusions. No pneumothorax.
Mediastinal contours appear intact. Degenerative changes in the
spine and shoulders.
IMPRESSION: No active disease.

## 2023-10-03 ENCOUNTER — Encounter (HOSPITAL_COMMUNITY)
Admission: RE | Disposition: A | Discharge status: Home / Self Care | Payer: Self-pay | Source: Home / Self Care | Attending: Vascular Surgery

## 2023-10-03 ENCOUNTER — Ambulatory Visit (HOSPITAL_COMMUNITY)
Admission: RE | Admit: 2023-10-03 | Discharge: 2023-10-03 | Disposition: A | Discharge status: Home / Self Care | Attending: Vascular Surgery | Admitting: Vascular Surgery

## 2023-10-03 SURGERY — A/V SHUNT INTERVENTION
Anesthesia: LOCAL | Site: Arm Upper | Laterality: Left

## 2023-10-04 ENCOUNTER — Telehealth: Payer: Self-pay

## 2023-10-04 NOTE — Telephone Encounter (Signed)
 Attempted to call for surgery scheduling. Call cannot be completed as dialed

## 2023-10-05 ENCOUNTER — Other Ambulatory Visit: Payer: Self-pay

## 2023-10-05 ENCOUNTER — Encounter: Payer: Self-pay | Admitting: Family

## 2023-10-05 ENCOUNTER — Ambulatory Visit (INDEPENDENT_AMBULATORY_CARE_PROVIDER_SITE_OTHER): Admitting: Family

## 2023-10-05 DIAGNOSIS — M25512 Pain in left shoulder: Secondary | ICD-10-CM | POA: Diagnosis not present

## 2023-10-05 DIAGNOSIS — M25511 Pain in right shoulder: Secondary | ICD-10-CM

## 2023-10-05 MED ORDER — METHYLPREDNISOLONE ACETATE 40 MG/ML IJ SUSP
40.0000 mg | INTRAMUSCULAR | Status: AC | PRN
  Administered 2023-10-05: 40 mg via INTRA_ARTICULAR

## 2023-10-05 MED ORDER — LIDOCAINE HCL 1 % IJ SOLN
5.0000 mL | INTRAMUSCULAR | Status: AC | PRN
  Administered 2023-10-05: 5 mL

## 2023-10-05 NOTE — Progress Notes (Signed)
 Office Visit Note   Patient: Deborah [name]           Date of Birth: 1965/10/09           MRN: [id number] Visit Date: 10/05/2023              Requested by: No referring provider defined for this encounter. PCP: Pcp, No  Chief Complaint  Patient presents with   Right Shoulder - Pain   Left Shoulder - Pain      HPI: The patient is a 58 year old woman who presents today with a 29-month history of bilateral shoulder pain she denies any associated injury she reports the she has had gradually worsening pain and is now unable to raise her arm high enough to even apply her own deodorant she certainly cannot reach above head or behind back.  She reports having multiple x-rays performed at her skilled nursing facility she is concerned she has torn rotator cuffs  Complains of throbbing aching toothache type pain diffusely bilateral shoulders they hurt equally  Assessment & Plan: Visit Diagnoses:  1. Acute pain of both shoulders     Plan: Depo-Medrol  injection bilateral shoulders today.  Will refer to Dr. Addie for follow-up in 4 weeks consideration of intra-articular injections.  Consider MRI bilateral shoulders  Follow-Up Instructions: No follow-ups on file.   Right Shoulder Exam   Tenderness  The patient is experiencing tenderness in the biceps tendon.  Range of Motion  Passive abduction:  60  Extension:  30  Forward flexion:  40   Other  Erythema: absent Pulse: present   Left Shoulder Exam   Tenderness  The patient is experiencing tenderness in the biceps tendon.  Range of Motion  Passive abduction:  70  Extension:  30  Forward flexion:  30   Other  Erythema: absent Pulse: present       Patient is alert, oriented, no adenopathy, well-dressed, normal affect, normal respiratory effort.     Imaging: No results found. No images are attached to the encounter.  Labs: Lab Results  Component Value Date   HGBA1C 5.7 (H) 02/04/2023   HGBA1C 8.1 (H)  06/19/2022   HGBA1C 10.1 (H) 03/21/2022   ESRSEDRATE 90 (H) 01/29/2023   ESRSEDRATE 119 (H) 12/21/2022   ESRSEDRATE 122 (H) 11/24/2022   CRP 5.9 (H) 02/11/2023   CRP 8.0 (H) 02/10/2023   CRP 8.2 (H) 02/09/2023   LABURIC 6.5 02/06/2023   LABURIC 6.7 11/28/2022   LABURIC 6.2 07/04/2022   REPTSTATUS 02/22/2023 FINAL 02/17/2023   CULT  02/17/2023    NO GROWTH 5 DAYS Performed at [facility], [address], [place], [place] [address]    LABORGA ESCHERICHIA COLI (A) 08/15/2021     Lab Results  Component Value Date   ALBUMIN  2.5 (L) 06/20/2023   ALBUMIN  2.1 (L) 06/18/2023   ALBUMIN  2.4 (L) 06/17/2023    Lab Results  Component Value Date   MG 1.9 05/31/2023   MG 2.3 05/29/2023   MG 2.1 04/28/2023   No results found for: VD25OH  No results found for: PREALBUMIN    Latest Ref Rng & Units 06/20/2023    9:33 AM 06/18/2023    5:00 AM 06/17/2023   12:02 PM  CBC EXTENDED  WBC 4.0 - 10.5 K/uL 8.7  9.4  8.7   RBC 3.87 - 5.11 MIL/uL 2.82  2.53  2.82   Hemoglobin 12.0 - 15.0 g/dL 8.3  7.5  8.2   HCT 63.9 -  46.0 % 28.0  25.2  27.9   Platelets 150 - 400 K/uL 268  303  368      There is no height or weight on file to calculate BMI.  Orders:  Orders Placed This Encounter  Procedures   XR Shoulder Left   XR Shoulder Right   No orders of the defined types were placed in this encounter.    Procedures: Large Joint Inj: bilateral subacromial bursa on 10/05/2023 10:33 AM Indications: pain Details: 22 G 1.5 in needle Medications (Right): 5 mL lidocaine  1 %; 40 mg methylPREDNISolone  acetate 40 MG/ML Medications (Left): 5 mL lidocaine  1 %; 40 mg methylPREDNISolone  acetate 40 MG/ML Consent was given by the patient.      Clinical Data: No additional findings.  ROS:  All other systems negative, except as noted in the HPI. Review of Systems  Objective: Vital Signs: LMP 10/10/2012   Specialty Comments:  No specialty comments available.  PMFS History: Patient  Active Problem List   Diagnosis Date Noted   Obesity, class 2 06/09/2023   Acute on chronic diastolic CHF (congestive heart failure) (HCC) 05/29/2023   Gastroparesis 05/29/2023   Chronic wound of left heel 05/29/2023   Amputation of right lower extremity below knee (HCC) 04/30/2023   Non-pressure chronic ulcer of other part of left foot limited to breakdown of skin (HCC) 04/30/2023   Dysphagia 04/28/2023   Volume overload 04/25/2023   Chronic diastolic CHF (congestive heart failure) (HCC) 04/25/2023   Carcinoid tumor of abdomen 04/25/2023   PVD (peripheral vascular disease) (HCC) 04/25/2023   History of GI bleed 04/25/2023   History of arteriovenous malformation (AVM) 04/25/2023   Generalized anxiety disorder 04/25/2023   History of reactive airway disease 04/25/2023   History of spinal stenosis 04/25/2023   Chronic diabetic ulcer of left foot determined by examination (HCC) 04/25/2023   Diarrhea 03/01/2023   Intractable diarrhea 02/28/2023   Sinus tachycardia 02/17/2023   Cellulitis of heel, left 02/17/2023   Hx of BKA, right (HCC) 01/31/2023   Tardive dyskinesia 01/06/2023   Peutz-Jeghers polyps of small bowel (HCC) 12/24/2022   Gastric AVM 12/23/2022   Anemia of chronic disease 12/14/2022   Pressure injury of skin 11/30/2022   Non-pressure chronic ulcer of right heel and midfoot limited to breakdown of skin (HCC) 11/24/2022   ESRD on dialysis (HCC) 09/09/2022   Peripheral neuropathy 09/09/2022   (HFpEF) heart failure with preserved ejection fraction (HCC) 09/09/2022   CHF (congestive heart failure) (HCC) 07/13/2022   Hyponatremia 05/29/2022   Leukocytosis 05/11/2022   Edema of left upper extremity 03/21/2022   Acute pyelonephritis    Paroxysmal atrial fibrillation (HCC)    Chronic pain syndrome/chronic abdominal pain 09/04/2019   Malignant carcinoid tumor of duodenum (HCC)    NASH (nonalcoholic steatohepatitis) 06/05/2019   Chronic diarrhea with history of C.Diff     Restrictive lung disease secondary to obesity    History of gastric ulcer    Fibroid uterus 02/23/2019   Abdominal pain 07/17/2018   Anxiety 11/29/2017   Spinal stenosis, lumbar region with neurogenic claudication 08/03/2017   GERD (gastroesophageal reflux disease) 03/19/2017   Chronic depression/anxiety 03/19/2017   Morbid obesity (HCC)    Normocytic anemia 08/16/2016   Chronic gout 06/05/2016   Chronic hypokalemia 09/26/2015   Hypomagnesemia and hypokalemia 09/26/2015   Insulin  dependent type 2 diabetes mellitus (HCC) 05/25/2015   Essential hypertension 09/28/2013   Past Medical History:  Diagnosis Date   Acute back pain with sciatica, left  Acute back pain with sciatica, right    Acute encephalopathy 05/29/2022   Acute osteomyelitis of right calcaneus (HCC) 12/21/2022   Anemia, unspecified    Atrial fibrillation with RVR (HCC)-resolved 09/09/2022   Atypical chest pain 09/10/2021   Cancer (HCC)    Carcinoid tumor of duodenum    Chest pain with normal coronary angiography 2019   Chronic a-fib (HCC) 09/09/2022   Chronic pain    Chronic systolic CHF (congestive heart failure) (HCC)    Dehiscence of amputation stump of right lower extremity (HCC) 01/29/2023   Diabetes mellitus    DKA (diabetic ketoacidosis) (HCC)    Drug-seeking behavior    21 hospitalizations and 14 CT a/p in 2 years for N/V and abdominal pain, demanding only IV dilaudid    Elevated troponin    chronic   Esophageal reflux    Fibromyalgia    Gastric ulcer    Gastroparesis    Gout    HCAP (healthcare-associated pneumonia) 06/19/2022   Hyperlipidemia    Hyperosmolar hyperglycemic state (HHS) (HCC) 05/11/2022   Hyperosmolar non-ketotic state due to type 2 diabetes mellitus (HCC) 05/11/2022   Hypertension    Hypomagnesemia    Lumbosacral stenosis    LVH (left ventricular hypertrophy)    Morbid obesity (HCC)    Nausea & vomiting 09/09/2022   NICM (nonischemic cardiomyopathy) (HCC)    PAF (paroxysmal  atrial fibrillation) (HCC)    Sepsis (HCC) 11/23/2022   Stroke (HCC) 02/2011   Symptomatic anemia 12/14/2022   Thrombocytosis    Vitamin B12 deficiency anemia     Family History  Problem Relation Age of Onset   Diabetes Mother    Diabetes Father    Heart disease Father    Diabetes Sister    Congestive Heart Failure Sister 56   Diabetes Brother     Past Surgical History:  Procedure Laterality Date   ABDOMINAL AORTOGRAM W/LOWER EXTREMITY N/A 11/29/2022   Procedure: ABDOMINAL AORTOGRAM W/LOWER EXTREMITY;  Surgeon: Pearline Norman RAMAN, MD;  Location: [facility];  Service: Cardiovascular;  Laterality: N/A;   AMPUTATION Right 12/29/2022   Procedure: RIGHT BELOW KNEE AMPUTATION;  Surgeon: Harden Jerona GAILS, MD;  Location: [facility] OR;  Service: Orthopedics;  Laterality: Right;   AV FISTULA PLACEMENT Left 06/30/2022   Procedure: LEFT BRACHIOCEPHALIC ARTERIOVENOUS (AV) FISTULA CREATION;  Surgeon: Gretta Lonni PARAS, MD;  Location: [facility] OR;  Service: Vascular;  Laterality: Left;   BIOPSY  07/27/2019   Procedure: BIOPSY;  Surgeon: Rosalie Kitchens, MD;  Location: [facility];  Service: Endoscopy;;   BIOPSY  07/30/2019   Procedure: BIOPSY;  Surgeon: Elicia Claw, MD;  Location: [facility];  Service: Gastroenterology;;   BIOPSY  04/28/2023   Procedure: BIOPSY;  Surgeon: Federico Rosario BROCKS, MD;  Location: [facility] ENDOSCOPY;  Service: Gastroenterology;;   CATARACT EXTRACTION  01/2014   CHOLECYSTECTOMY     COLONOSCOPY WITH PROPOFOL  N/A 07/30/2019   Procedure: COLONOSCOPY WITH PROPOFOL ;  Surgeon: Elicia Claw, MD;  Location: [facility];  Service: Gastroenterology;  Laterality: N/A;   ESOPHAGOGASTRODUODENOSCOPY N/A 07/27/2019   Procedure: ESOPHAGOGASTRODUODENOSCOPY (EGD);  Surgeon: Rosalie Kitchens, MD;  Location: [facility];  Service: Endoscopy;  Laterality: N/A;   ESOPHAGOGASTRODUODENOSCOPY N/A 07/26/2020   Procedure: ESOPHAGOGASTRODUODENOSCOPY (EGD);  Surgeon: Burnette Fallow, MD;  Location: [facility];   Service: Endoscopy;  Laterality: N/A;   ESOPHAGOGASTRODUODENOSCOPY (EGD) WITH PROPOFOL  N/A 08/02/2019   Procedure: ESOPHAGOGASTRODUODENOSCOPY (EGD) WITH PROPOFOL ;  Surgeon: Elicia Claw, MD;  Location: [facility];  Service: Gastroenterology;  Laterality: N/A;   ESOPHAGOGASTRODUODENOSCOPY (  EGD) WITH PROPOFOL  N/A 12/23/2022   Procedure: ESOPHAGOGASTRODUODENOSCOPY (EGD) WITH PROPOFOL ;  Surgeon: San Sandor GAILS, DO;  Location: [facility];  Service: Gastroenterology;  Laterality: N/A;   ESOPHAGOGASTRODUODENOSCOPY (EGD) WITH PROPOFOL  N/A 04/28/2023   Procedure: ESOPHAGOGASTRODUODENOSCOPY (EGD) WITH PROPOFOL ;  Surgeon: Federico Rosario BROCKS, MD;  Location: [facility] ENDOSCOPY;  Service: Gastroenterology;  Laterality: N/A;   FISTULA SUPERFICIALIZATION Left 12/31/2022   Procedure: LEFT ARM FISTULA TRANSPOSITION;  Surgeon: Sheree Penne Bruckner, MD;  Location: [facility] OR;  Service: Vascular;  Laterality: Left;   GIVENS CAPSULE STUDY N/A 12/23/2022   Procedure: GIVENS CAPSULE STUDY;  Surgeon: San Sandor GAILS, DO;  Location: [facility];  Service: Gastroenterology;  Laterality: N/A;   HEMOSTASIS CLIP PLACEMENT  08/02/2019   Procedure: HEMOSTASIS CLIP PLACEMENT;  Surgeon: Elicia Claw, MD;  Location: [facility];  Service: Gastroenterology;;   HOT HEMOSTASIS N/A 12/23/2022   Procedure: HOT HEMOSTASIS (ARGON PLASMA COAGULATION/BICAP);  Surgeon: San Sandor GAILS, DO;  Location: [facility] ENDOSCOPY;  Service: Gastroenterology;  Laterality: N/A;   IR FLUORO GUIDE CV LINE RIGHT  06/24/2022   IR US  GUIDE VASC ACCESS RIGHT  06/24/2022   POLYPECTOMY  07/30/2019   Procedure: POLYPECTOMY;  Surgeon: Elicia Claw, MD;  Location: [facility];  Service: Gastroenterology;;   POLYPECTOMY  08/02/2019   Procedure: POLYPECTOMY;  Surgeon: Elicia Claw, MD;  Location: [facility];  Service: Gastroenterology;;   HARLEY DILATION N/A 04/28/2023   Procedure: SAVORY DILATION;  Surgeon: Federico Rosario BROCKS, MD;  Location[facility]  ENDOSCOPY;  Service: Gastroenterology;  Laterality: N/A;   STUMP REVISION Right 02/02/2023   Procedure: REVISION RIGHT BELOW KNEE AMPUTATION;  Surgeon: Harden Jerona GAILS, MD;  Location: [facility] OR;  Service: Orthopedics;  Laterality: Right;   Social History   Occupational History   Not on file  Tobacco Use   Smoking status: Never   Smokeless tobacco: Never  Vaping Use   Vaping status: Never Used  Substance and Sexual Activity   Alcohol use: No   Drug use: No   Sexual activity: Not Currently    Birth control/protection: None

## 2023-10-05 NOTE — Addendum Note (Signed)
 Addended by: Zayvien Canning R on: 10/05/2023 03:47 PM   Modules accepted: Orders

## 2023-10-07 ENCOUNTER — Telehealth: Payer: Self-pay

## 2023-10-07 ENCOUNTER — Other Ambulatory Visit: Payer: Self-pay

## 2023-10-07 DIAGNOSIS — N186 End stage renal disease: Secondary | ICD-10-CM

## 2023-10-07 NOTE — Telephone Encounter (Signed)
 Patient phone number changed.  Current phone number listed.

## 2023-11-02 ENCOUNTER — Ambulatory Visit (INDEPENDENT_AMBULATORY_CARE_PROVIDER_SITE_OTHER): Admitting: Orthopedic Surgery

## 2023-11-02 DIAGNOSIS — M25511 Pain in right shoulder: Secondary | ICD-10-CM | POA: Diagnosis not present

## 2023-11-02 DIAGNOSIS — M25512 Pain in left shoulder: Secondary | ICD-10-CM

## 2023-11-03 ENCOUNTER — Encounter: Payer: Self-pay | Admitting: Orthopedic Surgery

## 2023-11-03 NOTE — Progress Notes (Signed)
 Office Visit Note   Patient: Deborah [name]           Date of Birth: February 03, 1966           MRN: [id number] Visit Date: 11/02/2023 Requested by: No referring provider defined for this encounter. PCP: Pcp, No  Subjective: Chief Complaint  Patient presents with   Left Shoulder - Pain   Right Shoulder - Pain    HPI: Deborah [name] is a 58 y.o. female who presents to the office reporting bilateral shoulder pain.  She has known history of bilateral shoulder arthritis as well as rotator cuff arthropathy.  She has had a right BKA and has a wound that is healing on her left foot.  Currently living at a nursing home.  Is diabetic.  Is in a wheelchair.              ROS: All systems reviewed are negative as they relate to the chief complaint within the history of present illness.  Patient denies fevers or chills.  Assessment & Plan: Visit Diagnoses:  1. Acute pain of both shoulders     Plan: Impression is bilateral shoulder arthritis and rotator cuff arthropathy.  Radiographically the left is a little bit worse than the right but the right is more painful.  She has very limited forward flexion and abduction but her deltoid does fire.  In general she is a very high risk for any type of operative procedure due to multiple medical comorbidities as well as the healing ulcer on the foot.  Would not recommend surgical intervention at this time.  We talked about injections and she wants to hold off on that as well.  She will come back if she wants to consider nonoperative treatment for the shoulders.  She does have a fistula which is maturing on the left-hand side and on the right-hand side she has temporary access.  Follow-Up Instructions: No follow-ups on file.   Orders:  No orders of the defined types were placed in this encounter.  No orders of the defined types were placed in this encounter.     Procedures: No procedures performed   Clinical Data: No additional  findings.  Objective: Vital Signs: LMP 10/10/2012   Physical Exam:  Constitutional: Patient appears well-developed HEENT:  Head: Normocephalic Eyes:EOM are normal Neck: Normal range of motion Cardiovascular: Normal rate Pulmonary/chest: Effort normal Neurologic: Patient is alert Skin: Skin is warm Psychiatric: Patient has normal mood and affect  Ortho Exam: Ortho exam demonstrates fistula in the elbow region on the left-hand side.  She has about 40 degrees of active forward flexion abduction in both shoulders.  Deltoid does fire.  Coarseness and grinding is present with active and passive range of motion of both shoulders.  Has good grip EPL FPL interosseous wrist flexion wrist extension biceps triceps strength present.  Rotator cuff strength is diminished in bilateral shoulders.  Specialty Comments:  No specialty comments available.  Imaging: No results found.   PMFS History: Patient Active Problem List   Diagnosis Date Noted   Obesity, class 2 06/09/2023   Acute on chronic diastolic CHF (congestive heart failure) (HCC) 05/29/2023   Gastroparesis 05/29/2023   Chronic wound of left heel 05/29/2023   Amputation of right lower extremity below knee (HCC) 04/30/2023   Non-pressure chronic ulcer of other part of left foot limited to breakdown of skin (HCC) 04/30/2023   Dysphagia 04/28/2023   Volume overload 04/25/2023   Chronic diastolic CHF (congestive heart failure) (  HCC) 04/25/2023   Carcinoid tumor of abdomen 04/25/2023   PVD (peripheral vascular disease) (HCC) 04/25/2023   History of GI bleed 04/25/2023   History of arteriovenous malformation (AVM) 04/25/2023   Generalized anxiety disorder 04/25/2023   History of reactive airway disease 04/25/2023   History of spinal stenosis 04/25/2023   Chronic diabetic ulcer of left foot determined by examination (HCC) 04/25/2023   Diarrhea 03/01/2023   Intractable diarrhea 02/28/2023   Sinus tachycardia 02/17/2023   Cellulitis of  heel, left 02/17/2023   Hx of BKA, right (HCC) 01/31/2023   Tardive dyskinesia 01/06/2023   Peutz-Jeghers polyps of small bowel (HCC) 12/24/2022   Gastric AVM 12/23/2022   Anemia of chronic disease 12/14/2022   Pressure injury of skin 11/30/2022   Non-pressure chronic ulcer of right heel and midfoot limited to breakdown of skin (HCC) 11/24/2022   ESRD on dialysis (HCC) 09/09/2022   Peripheral neuropathy 09/09/2022   (HFpEF) heart failure with preserved ejection fraction (HCC) 09/09/2022   CHF (congestive heart failure) (HCC) 07/13/2022   Hyponatremia 05/29/2022   Leukocytosis 05/11/2022   Edema of left upper extremity 03/21/2022   Acute pyelonephritis    Paroxysmal atrial fibrillation (HCC)    Chronic pain syndrome/chronic abdominal pain 09/04/2019   Malignant carcinoid tumor of duodenum (HCC)    NASH (nonalcoholic steatohepatitis) 06/05/2019   Chronic diarrhea with history of C.Diff    Restrictive lung disease secondary to obesity    History of gastric ulcer    Fibroid uterus 02/23/2019   Abdominal pain 07/17/2018   Anxiety 11/29/2017   Spinal stenosis, lumbar region with neurogenic claudication 08/03/2017   GERD (gastroesophageal reflux disease) 03/19/2017   Chronic depression/anxiety 03/19/2017   Morbid obesity (HCC)    Normocytic anemia 08/16/2016   Chronic gout 06/05/2016   Chronic hypokalemia 09/26/2015   Hypomagnesemia and hypokalemia 09/26/2015   Insulin  dependent type 2 diabetes mellitus (HCC) 05/25/2015   Essential hypertension 09/28/2013   Past Medical History:  Diagnosis Date   Acute back pain with sciatica, left    Acute back pain with sciatica, right    Acute encephalopathy 05/29/2022   Acute osteomyelitis of right calcaneus (HCC) 12/21/2022   Anemia, unspecified    Atrial fibrillation with RVR (HCC)-resolved 09/09/2022   Atypical chest pain 09/10/2021   Cancer (HCC)    Carcinoid tumor of duodenum    Chest pain with normal coronary angiography 2019    Chronic a-fib (HCC) 09/09/2022   Chronic pain    Chronic systolic CHF (congestive heart failure) (HCC)    Dehiscence of amputation stump of right lower extremity (HCC) 01/29/2023   Diabetes mellitus    DKA (diabetic ketoacidosis) (HCC)    Drug-seeking behavior    21 hospitalizations and 14 CT a/p in 2 years for N/V and abdominal pain, demanding only IV dilaudid    Elevated troponin    chronic   Esophageal reflux    Fibromyalgia    Gastric ulcer    Gastroparesis    Gout    HCAP (healthcare-associated pneumonia) 06/19/2022   Hyperlipidemia    Hyperosmolar hyperglycemic state (HHS) (HCC) 05/11/2022   Hyperosmolar non-ketotic state due to type 2 diabetes mellitus (HCC) 05/11/2022   Hypertension    Hypomagnesemia    Lumbosacral stenosis    LVH (left ventricular hypertrophy)    Morbid obesity (HCC)    Nausea & vomiting 09/09/2022   NICM (nonischemic cardiomyopathy) (HCC)    PAF (paroxysmal atrial fibrillation) (HCC)    Sepsis (HCC) 11/23/2022   Stroke (HCC) 02/2011  Symptomatic anemia 12/14/2022   Thrombocytosis    Vitamin B12 deficiency anemia     Family History  Problem Relation Age of Onset   Diabetes Mother    Diabetes Father    Heart disease Father    Diabetes Sister    Congestive Heart Failure Sister 48   Diabetes Brother     Past Surgical History:  Procedure Laterality Date   ABDOMINAL AORTOGRAM W/LOWER EXTREMITY N/A 11/29/2022   Procedure: ABDOMINAL AORTOGRAM W/LOWER EXTREMITY;  Surgeon: Pearline Norman RAMAN, MD;  Location: [facility] INVASIVE CV LAB;  Service: Cardiovascular;  Laterality: N/A;   AMPUTATION Right 12/29/2022   Procedure: RIGHT BELOW KNEE AMPUTATION;  Surgeon: Harden Jerona GAILS, MD;  Location: [facility] OR;  Service: Orthopedics;  Laterality: Right;   AV FISTULA PLACEMENT Left 06/30/2022   Procedure: LEFT BRACHIOCEPHALIC ARTERIOVENOUS (AV) FISTULA CREATION;  Surgeon: Gretta Lonni PARAS, MD;  Location: [facility] OR;  Service: Vascular;  Laterality: Left;   BIOPSY  07/27/2019    Procedure: BIOPSY;  Surgeon: Rosalie Kitchens, MD;  Location: [facility];  Service: Endoscopy;;   BIOPSY  07/30/2019   Procedure: BIOPSY;  Surgeon: Elicia Claw, MD;  Location: [facility];  Service: Gastroenterology;;   BIOPSY  04/28/2023   Procedure: BIOPSY;  Surgeon: Federico Rosario BROCKS, MD;  Location: [facility] ENDOSCOPY;  Service: Gastroenterology;;   CATARACT EXTRACTION  01/2014   CHOLECYSTECTOMY     COLONOSCOPY WITH PROPOFOL  N/A 07/30/2019   Procedure: COLONOSCOPY WITH PROPOFOL ;  Surgeon: Elicia Claw, MD;  Location: [facility];  Service: Gastroenterology;  Laterality: N/A;   ESOPHAGOGASTRODUODENOSCOPY N/A 07/27/2019   Procedure: ESOPHAGOGASTRODUODENOSCOPY (EGD);  Surgeon: Rosalie Kitchens, MD;  Location: [facility];  Service: Endoscopy;  Laterality: N/A;   ESOPHAGOGASTRODUODENOSCOPY N/A 07/26/2020   Procedure: ESOPHAGOGASTRODUODENOSCOPY (EGD);  Surgeon: Burnette Fallow, MD;  Location: [facility];  Service: Endoscopy;  Laterality: N/A;   ESOPHAGOGASTRODUODENOSCOPY (EGD) WITH PROPOFOL  N/A 08/02/2019   Procedure: ESOPHAGOGASTRODUODENOSCOPY (EGD) WITH PROPOFOL ;  Surgeon: Elicia Claw, MD;  Location: [facility];  Service: Gastroenterology;  Laterality: N/A;   ESOPHAGOGASTRODUODENOSCOPY (EGD) WITH PROPOFOL  N/A 12/23/2022   Procedure: ESOPHAGOGASTRODUODENOSCOPY (EGD) WITH PROPOFOL ;  Surgeon: San Sandor GAILS, DO;  Location: [facility];  Service: Gastroenterology;  Laterality: N/A;   ESOPHAGOGASTRODUODENOSCOPY (EGD) WITH PROPOFOL  N/A 04/28/2023   Procedure: ESOPHAGOGASTRODUODENOSCOPY (EGD) WITH PROPOFOL ;  Surgeon: Federico Rosario BROCKS, MD;  Location: [facility] ENDOSCOPY;  Service: Gastroenterology;  Laterality: N/A;   FISTULA SUPERFICIALIZATION Left 12/31/2022   Procedure: LEFT ARM FISTULA TRANSPOSITION;  Surgeon: Sheree Penne Lonni, MD;  [facility];  Service: Vascular;  Laterality: Left;   GIVENS CAPSULE STUDY N/A 12/23/2022   Procedure: GIVENS CAPSULE STUDY;  Surgeon: San Sandor GAILS, DO;   Location: [facility]OS[facility];  Service: Gastroenterology;  Laterality: N/A;   HEMOSTASIS CLIP PLACEMENT  08/02/2019   Procedure: HEMOSTASIS CLIP PLACEMENT;  Surgeon: Elicia Claw, MD;  Location: [facility];  Service: Gastroenterology;;   HOT HEMOSTASIS N/A 12/23/2022   Procedure: HOT HEMOSTASIS (ARGON PLASMA COAGULATION/BICAP);  Surgeon: San Sandor GAILS, DO;  Location: [facility] ENDOSCOPY;  Service: Gastroenterology;  Laterality: N/A;   IR FLUORO GUIDE CV LINE RIGHT  06/24/2022   IR US  GUIDE VASC ACCESS RIGHT  06/24/2022   POLYPECTOMY  07/30/2019   Procedure: POLYPECTOMY;  Surgeon: Elicia Claw, MD;  Location: [facility];  Service: Gastroenterology;;   POLYPECTOMY  08/02/2019   Procedure: POLYPECTOMY;  Surgeon: Elicia Claw, MD;  Location: [facility];  Service: Gastroenterology;;   HARLEY DILATION N/A 04/28/2023   Procedure: SAVORY DILATION;  Surgeon: Federico Rosario BROCKS, MD;  Location: [facility] ENDOSCOPY;  Service: Gastroenterology;  Laterality: N/A;   STUMP REVISION Right 02/02/2023   Procedure: REVISION RIGHT BELOW KNEE AMPUTATION;  Surgeon: Harden Jerona GAILS, MD;  Location: [facility] OR;  Service: Orthopedics;  Laterality: Right;   Social History   Occupational History   Not on file  Tobacco Use   Smoking status: Never   Smokeless tobacco: Never  Vaping Use   Vaping status: Never Used  Substance and Sexual Activity   Alcohol use: No   Drug use: No   Sexual activity: Not Currently    Birth control/protection: None

## 2023-11-11 ENCOUNTER — Ambulatory Visit (HOSPITAL_COMMUNITY): Admission: RE | Admit: 2023-11-11 | Source: Home / Self Care | Admitting: Vascular Surgery

## 2023-11-11 ENCOUNTER — Telehealth: Payer: Self-pay

## 2023-11-11 ENCOUNTER — Encounter (HOSPITAL_COMMUNITY): Admission: RE | Payer: Self-pay | Source: Home / Self Care

## 2023-11-11 SURGERY — A/V FISTULAGRAM
Anesthesia: LOCAL | Laterality: Left

## 2023-11-11 NOTE — Telephone Encounter (Signed)
 Per cath lab staff, patient was a no show for fistulagram with Dr. Magda.

## 2023-12-19 ENCOUNTER — Ambulatory Visit: Admitting: Orthopedic Surgery

## 2023-12-30 ENCOUNTER — Ambulatory Visit: Admitting: Family

## 2024-01-04 ENCOUNTER — Ambulatory Visit: Admitting: Physician Assistant

## 2024-01-09 ENCOUNTER — Encounter: Payer: Self-pay | Admitting: Radiology

## 2024-02-13 ENCOUNTER — Ambulatory Visit: Admitting: Orthopedic Surgery

## 2024-02-13 ENCOUNTER — Encounter: Payer: Self-pay | Admitting: Orthopedic Surgery

## 2024-02-13 DIAGNOSIS — Z89511 Acquired absence of right leg below knee: Secondary | ICD-10-CM

## 2024-02-13 DIAGNOSIS — M25511 Pain in right shoulder: Secondary | ICD-10-CM

## 2024-02-13 NOTE — Progress Notes (Signed)
 Office Visit Note   Patient: Deborah [name]           Date of Birth: 1965-08-17           MRN: [id number] Visit Date: 02/13/2024              Requested by: No referring provider defined for this encounter. PCP: Pcp, No  Chief Complaint  Patient presents with   Right Shoulder - Follow-up   Left Shoulder - Follow-up      HPI: Discussed the use of AI scribe software for clinical note transcription with the patient, who gave verbal consent to proceed.  History of Present Illness Deborah [name] is a 58 year old female who presents for further discussions of amputation and prosthesis fitting.  She has been fitted for a prosthesis but is currently awaiting insurance authorization for payment. Once the prosthesis is available, she plans to start physical therapy at her facility.  She experiences ongoing issues with her shoulders, noting that subacromial injections have not provided relief. She reports that Dr. Addie told her she is not a good surgical candidate for shoulder replacement. She experiences increased joint pain, particularly in bad weather, affecting her shoulders and other joints.     Assessment & Plan: Visit Diagnoses:  1. Acute pain of both shoulders   2. Right below-knee amputee [facility])     Plan: Assessment and Plan Assessment & Plan Right transtibial amputation, awaiting prosthesis Amputation well healed with some swelling. Prosthesis fitting complete, pending insurance authorization. - Await insurance authorization for prosthesis. - Initiate physical therapy at her facility once prosthesis is available.  Bilateral shoulder pain, refractory to injections, not surgical candidate Persistent shoulder pain despite injections. Not a surgical candidate per Dr. Addie. - Continue current management without surgical intervention. - Follow up in the office as needed.      Follow-Up Instructions: Return if symptoms worsen or fail to improve.   Ortho  Exam  Patient is alert, oriented, no adenopathy, well-dressed, normal affect, normal respiratory effort. Physical Exam EXTREMITIES: Right transtibial amputation is well healed with some swelling.  Pain with range of motion of her shoulders.      Imaging: No results found. No images are attached to the encounter.  Labs: Lab Results  Component Value Date   HGBA1C 5.7 (H) 02/04/2023   HGBA1C 8.1 (H) 06/19/2022   HGBA1C 10.1 (H) 03/21/2022   ESRSEDRATE 90 (H) 01/29/2023   ESRSEDRATE 119 (H) 12/21/2022   ESRSEDRATE 122 (H) 11/24/2022   CRP 5.9 (H) 02/11/2023   CRP 8.0 (H) 02/10/2023   CRP 8.2 (H) 02/09/2023   LABURIC 6.5 02/06/2023   LABURIC 6.7 11/28/2022   LABURIC 6.2 07/04/2022   REPTSTATUS 02/22/2023 FINAL 02/17/2023   CULT  02/17/2023    NO GROWTH 5 DAYS Performed at [facility], [address], [place], [place] [address]    LABORGA ESCHERICHIA COLI (A) 08/15/2021     Lab Results  Component Value Date   ALBUMIN  2.5 (L) 06/20/2023   ALBUMIN  2.1 (L) 06/18/2023   ALBUMIN  2.4 (L) 06/17/2023    Lab Results  Component Value Date   MG 1.9 05/31/2023   MG 2.3 05/29/2023   MG 2.1 04/28/2023   No results found for: VD25OH  No results found for: PREALBUMIN    Latest Ref Rng & Units 06/20/2023    9:33 AM 06/18/2023    5:00 AM 06/17/2023   12:02 PM  CBC EXTENDED  WBC 4.0 - 10.5  K/uL 8.7  9.4  8.7   RBC 3.87 - 5.11 MIL/uL 2.82  2.53  2.82   Hemoglobin 12.0 - 15.0 g/dL 8.3  7.5  8.2   HCT 63.9 - 46.0 % 28.0  25.2  27.9   Platelets 150 - 400 K/uL 268  303  368      There is no height or weight on file to calculate BMI.  Orders:  No orders of the defined types were placed in this encounter.  No orders of the defined types were placed in this encounter.    Procedures: No procedures performed  Clinical Data: No additional findings.  ROS:  All other systems negative, except as noted in the HPI. Review of Systems  Objective: Vital Signs: LMP  10/10/2012   Specialty Comments:  No specialty comments available.  PMFS History: Patient Active Problem List   Diagnosis Date Noted   Obesity, class 2 06/09/2023   Acute on chronic diastolic CHF (congestive heart failure) (HCC) 05/29/2023   Gastroparesis 05/29/2023   Chronic wound of left heel 05/29/2023   Amputation of right lower extremity below knee (HCC) 04/30/2023   Non-pressure chronic ulcer of other part of left foot limited to breakdown of skin (HCC) 04/30/2023   Dysphagia 04/28/2023   Volume overload 04/25/2023   Chronic diastolic CHF (congestive heart failure) (HCC) 04/25/2023   Carcinoid tumor of abdomen (HCC) 04/25/2023   PVD (peripheral vascular disease) 04/25/2023   History of GI bleed 04/25/2023   History of arteriovenous malformation (AVM) 04/25/2023   Generalized anxiety disorder 04/25/2023   History of reactive airway disease 04/25/2023   History of spinal stenosis 04/25/2023   Chronic diabetic ulcer of left foot determined by examination (HCC) 04/25/2023   Diarrhea 03/01/2023   Intractable diarrhea 02/28/2023   Sinus tachycardia 02/17/2023   Cellulitis of heel, left 02/17/2023   Hx of BKA, right (HCC) 01/31/2023   Tardive dyskinesia 01/06/2023   Peutz-Jeghers polyps of small bowel (HCC) 12/24/2022   Gastric AVM 12/23/2022   Anemia of chronic disease 12/14/2022   Pressure injury of skin 11/30/2022   Non-pressure chronic ulcer of right heel and midfoot limited to breakdown of skin (HCC) 11/24/2022   ESRD on dialysis (HCC) 09/09/2022   Peripheral neuropathy 09/09/2022   (HFpEF) heart failure with preserved ejection fraction (HCC) 09/09/2022   CHF (congestive heart failure) (HCC) 07/13/2022   Hyponatremia 05/29/2022   Leukocytosis 05/11/2022   Edema of left upper extremity 03/21/2022   Acute pyelonephritis    Paroxysmal atrial fibrillation (HCC)    Chronic pain syndrome/chronic abdominal pain 09/04/2019   Malignant carcinoid tumor of duodenum (HCC)     NASH (nonalcoholic steatohepatitis) 06/05/2019   Chronic diarrhea with history of C.Diff    Restrictive lung disease secondary to obesity    History of gastric ulcer    Fibroid uterus 02/23/2019   Abdominal pain 07/17/2018   Anxiety 11/29/2017   Spinal stenosis, lumbar region with neurogenic claudication 08/03/2017   GERD (gastroesophageal reflux disease) 03/19/2017   Chronic depression/anxiety 03/19/2017   Morbid obesity (HCC)    Normocytic anemia 08/16/2016   Chronic gout 06/05/2016   Chronic hypokalemia 09/26/2015   Hypomagnesemia and hypokalemia 09/26/2015   Insulin  dependent type 2 diabetes mellitus (HCC) 05/25/2015   Essential hypertension 09/28/2013   Past Medical History:  Diagnosis Date   Acute back pain with sciatica, left    Acute back pain with sciatica, right    Acute encephalopathy 05/29/2022   Acute osteomyelitis of right calcaneus (HCC) 12/21/2022  Anemia, unspecified    Atrial fibrillation with RVR (HCC)-resolved 09/09/2022   Atypical chest pain 09/10/2021   Cancer [facility])    Carcinoid tumor of duodenum (HCC)    Chest pain with normal coronary angiography 2019   Chronic a-fib (HCC) 09/09/2022   Chronic pain    Chronic systolic CHF (congestive heart failure) (HCC)    Dehiscence of amputation stump of right lower extremity (HCC) 01/29/2023   Diabetes mellitus    DKA (diabetic ketoacidosis) (HCC)    Drug-seeking behavior    21 hospitalizations and 14 CT a/p in 2 years for N/V and abdominal pain, demanding only IV dilaudid    Elevated troponin    chronic   Esophageal reflux    Fibromyalgia    Gastric ulcer    Gastroparesis    Gout    HCAP (healthcare-associated pneumonia) 06/19/2022   Hyperlipidemia    Hyperosmolar hyperglycemic state (HHS) (HCC) 05/11/2022   Hyperosmolar non-ketotic state due to type 2 diabetes mellitus (HCC) 05/11/2022   Hypertension    Hypomagnesemia    Lumbosacral stenosis    LVH (left ventricular hypertrophy)    Morbid obesity  (HCC)    Nausea & vomiting 09/09/2022   NICM (nonischemic cardiomyopathy) (HCC)    PAF (paroxysmal atrial fibrillation) (HCC)    Sepsis (HCC) 11/23/2022   Stroke (HCC) 02/2011   Symptomatic anemia 12/14/2022   Thrombocytosis    Vitamin B12 deficiency anemia     Family History  Problem Relation Age of Onset   Diabetes Mother    Diabetes Father    Heart disease Father    Diabetes Sister    Congestive Heart Failure Sister 19   Diabetes Brother     Past Surgical History:  Procedure Laterality Date   ABDOMINAL AORTOGRAM W/LOWER EXTREMITY N/A 11/29/2022   Procedure: ABDOMINAL AORTOGRAM W/LOWER EXTREMITY;  Surgeon: Pearline Norman RAMAN, MD;  Location: [facility];  Service: Cardiovascular;  Laterality: N/A;   AMPUTATION Right 12/29/2022   Procedure: RIGHT BELOW KNEE AMPUTATION;  Surgeon: Harden Jerona GAILS, MD;  Location[facility] OR;  Service: Orthopedics;  Laterality: Right;   AV FISTULA PLACEMENT Left 06/30/2022   Procedure: LEFT BRACHIOCEPHALIC ARTERIOVENOUS (AV) FISTULA CREATION;  Surgeon: Gretta Lonni PARAS, MD;  Location: [facility] OR;  Service: Vascular;  Laterality: Left;   BIOPSY  07/27/2019   Procedure: BIOPSY;  Surgeon: Rosalie Kitchens, MD;  Location: [facility]OSCOPY;  Service: Endoscopy;;   BIOPSY  07/30/2019   Procedure: BIOPSY;  Surgeon: Elicia Claw, MD;  Location: [facility]OSCOPY;  Service: Gastroenterology;;   BIOPSY  04/28/2023   Procedure: BIOPSY;  Surgeon: Federico Rosario BROCKS, MD;  Location: [facility] ENDOSCOPY;  Service: Gastroenterology;;   CATARACT EXTRACTION  01/2014   CHOLECYSTECTOMY     COLONOSCOPY WITH PROPOFOL  N/A 07/30/2019   Procedure: COLONOSCOPY WITH PROPOFOL ;  Surgeon: Elicia Claw, MD;  Location: [facility];  Service: Gastroenterology;  Laterality: N/A;   ESOPHAGOGASTRODUODENOSCOPY N/A 07/27/2019   Procedure: ESOPHAGOGASTRODUODENOSCOPY (EGD);  Surgeon: Rosalie Kitchens, MD;  Location: [facility];  Service: Endoscopy;  Laterality: N/A;   ESOPHAGOGASTRODUODENOSCOPY N/A 07/26/2020    Procedure: ESOPHAGOGASTRODUODENOSCOPY (EGD);  Surgeon: Burnette Fallow, MD;  Location: [facility];  Service: Endoscopy;  Laterality: N/A;   ESOPHAGOGASTRODUODENOSCOPY (EGD) WITH PROPOFOL  N/A 08/02/2019   Procedure: ESOPHAGOGASTRODUODENOSCOPY (EGD) WITH PROPOFOL ;  Surgeon: Elicia Claw, MD;  Location: [facility];  Service: Gastroenterology;  Laterality: N/A;   ESOPHAGOGASTRODUODENOSCOPY (EGD) WITH PROPOFOL  N/A 12/23/2022   Procedure: ESOPHAGOGASTRODUODENOSCOPY (EGD) WITH PROPOFOL ;  Surgeon: San Sandor GAILS, DO;  Location: [facility];  Service: Gastroenterology;  Laterality: N/A;   ESOPHAGOGASTRODUODENOSCOPY (EGD) WITH PROPOFOL  N/A 04/28/2023   Procedure: ESOPHAGOGASTRODUODENOSCOPY (EGD) WITH PROPOFOL ;  Surgeon: Federico Rosario BROCKS, MD;  Location: [facility] ENDOSCOPY;  Service: Gastroenterology;  Laterality: N/A;   FISTULA SUPERFICIALIZATION Left 12/31/2022   Procedure: LEFT ARM FISTULA TRANSPOSITION;  Surgeon: Sheree Penne Bruckner, MD;  Location: [facility] OR;  Service: Vascular;  Laterality: Left;   GIVENS CAPSULE STUDY N/A 12/23/2022   Procedure: GIVENS CAPSULE STUDY;  Surgeon: San Sandor GAILS, DO;  Location: [facility]OS[facility];  Service: Gastroenterology;  Laterality: N/A;   HEMOSTASIS CLIP PLACEMENT  08/02/2019   Procedure: HEMOSTASIS CLIP PLACEMENT;  Surgeon: Elicia Claw, MD;  Location: [facility];  Service: Gastroenterology;;   HOT HEMOSTASIS N/A 12/23/2022   Procedure: HOT HEMOSTASIS (ARGON PLASMA COAGULATION/BICAP);  Surgeon: San Sandor GAILS, DO;  Location: [facility] ENDOSCOPY;  Service: Gastroenterology;  Laterality: N/A;   IR FLUORO GUIDE CV LINE RIGHT  06/24/2022   IR US  GUIDE VASC ACCESS RIGHT  06/24/2022   POLYPECTOMY  07/30/2019   Procedure: POLYPECTOMY;  Surgeon: Elicia Claw, MD;  Location: [facility];  Service: Gastroenterology;;   POLYPECTOMY  08/02/2019   Procedure: POLYPECTOMY;  Surgeon: Elicia Claw, MD;  Location: [facility];  Service: Gastroenterology;;   HARLEY  DILATION N/A 04/28/2023   Procedure: SAVORY DILATION;  Surgeon: Federico Rosario BROCKS, MD;  Location: [facility] ENDOSCOPY;  Service: Gastroenterology;  Laterality: N/A;   STUMP REVISION Right 02/02/2023   Procedure: REVISION RIGHT BELOW KNEE AMPUTATION;  Surgeon: Harden Jerona GAILS, MD;  Location: [facility] OR;  Service: Orthopedics;  Laterality: Right;   Social History   Occupational History   Not on file  Tobacco Use   Smoking status: Never   Smokeless tobacco: Never  Vaping Use   Vaping status: Never Used  Substance and Sexual Activity   Alcohol use: No   Drug use: No   Sexual activity: Not Currently    Birth control/protection: None

## 2024-02-14 ENCOUNTER — Other Ambulatory Visit: Payer: Self-pay

## 2024-02-14 ENCOUNTER — Emergency Department (HOSPITAL_COMMUNITY)

## 2024-02-14 ENCOUNTER — Inpatient Hospital Stay (HOSPITAL_COMMUNITY)
Admission: EM | Admit: 2024-02-14 | Discharge: 2024-02-25 | DRG: 871 | Disposition: A | Discharge status: Skilled Nursing Facility | Source: Ambulatory Visit | Attending: Internal Medicine | Admitting: Internal Medicine

## 2024-02-14 DIAGNOSIS — I5022 Chronic systolic (congestive) heart failure: Secondary | ICD-10-CM | POA: Diagnosis present

## 2024-02-14 DIAGNOSIS — R4182 Altered mental status, unspecified: Secondary | ICD-10-CM | POA: Diagnosis present

## 2024-02-14 DIAGNOSIS — D252 Subserosal leiomyoma of uterus: Secondary | ICD-10-CM | POA: Diagnosis present

## 2024-02-14 DIAGNOSIS — I428 Other cardiomyopathies: Secondary | ICD-10-CM | POA: Diagnosis present

## 2024-02-14 DIAGNOSIS — L89620 Pressure ulcer of left heel, unstageable: Secondary | ICD-10-CM | POA: Diagnosis present

## 2024-02-14 DIAGNOSIS — Z888 Allergy status to other drugs, medicaments and biological substances status: Secondary | ICD-10-CM

## 2024-02-14 DIAGNOSIS — N186 End stage renal disease: Secondary | ICD-10-CM | POA: Diagnosis present

## 2024-02-14 DIAGNOSIS — E66811 Obesity, class 1: Secondary | ICD-10-CM | POA: Diagnosis present

## 2024-02-14 DIAGNOSIS — B952 Enterococcus as the cause of diseases classified elsewhere: Secondary | ICD-10-CM | POA: Diagnosis present

## 2024-02-14 DIAGNOSIS — R569 Unspecified convulsions: Secondary | ICD-10-CM | POA: Diagnosis not present

## 2024-02-14 DIAGNOSIS — E785 Hyperlipidemia, unspecified: Secondary | ICD-10-CM | POA: Diagnosis present

## 2024-02-14 DIAGNOSIS — M109 Gout, unspecified: Secondary | ICD-10-CM | POA: Diagnosis present

## 2024-02-14 DIAGNOSIS — K219 Gastro-esophageal reflux disease without esophagitis: Secondary | ICD-10-CM | POA: Diagnosis present

## 2024-02-14 DIAGNOSIS — M7989 Other specified soft tissue disorders: Secondary | ICD-10-CM | POA: Diagnosis present

## 2024-02-14 DIAGNOSIS — R6520 Severe sepsis without septic shock: Secondary | ICD-10-CM | POA: Diagnosis present

## 2024-02-14 DIAGNOSIS — E876 Hypokalemia: Secondary | ICD-10-CM | POA: Diagnosis present

## 2024-02-14 DIAGNOSIS — I9589 Other hypotension: Secondary | ICD-10-CM | POA: Diagnosis present

## 2024-02-14 DIAGNOSIS — Z89511 Acquired absence of right leg below knee: Secondary | ICD-10-CM

## 2024-02-14 DIAGNOSIS — M797 Fibromyalgia: Secondary | ICD-10-CM | POA: Diagnosis present

## 2024-02-14 DIAGNOSIS — I509 Heart failure, unspecified: Secondary | ICD-10-CM | POA: Diagnosis not present

## 2024-02-14 DIAGNOSIS — I132 Hypertensive heart and chronic kidney disease with heart failure and with stage 5 chronic kidney disease, or end stage renal disease: Secondary | ICD-10-CM | POA: Diagnosis present

## 2024-02-14 DIAGNOSIS — Z79899 Other long term (current) drug therapy: Secondary | ICD-10-CM

## 2024-02-14 DIAGNOSIS — I482 Chronic atrial fibrillation, unspecified: Secondary | ICD-10-CM | POA: Diagnosis present

## 2024-02-14 DIAGNOSIS — D631 Anemia in chronic kidney disease: Secondary | ICD-10-CM | POA: Diagnosis present

## 2024-02-14 DIAGNOSIS — D72829 Elevated white blood cell count, unspecified: Secondary | ICD-10-CM | POA: Diagnosis not present

## 2024-02-14 DIAGNOSIS — Z8673 Personal history of transient ischemic attack (TIA), and cerebral infarction without residual deficits: Secondary | ICD-10-CM

## 2024-02-14 DIAGNOSIS — N2581 Secondary hyperparathyroidism of renal origin: Secondary | ICD-10-CM | POA: Diagnosis present

## 2024-02-14 DIAGNOSIS — E1122 Type 2 diabetes mellitus with diabetic chronic kidney disease: Secondary | ICD-10-CM | POA: Diagnosis present

## 2024-02-14 DIAGNOSIS — K3184 Gastroparesis: Secondary | ICD-10-CM | POA: Diagnosis present

## 2024-02-14 DIAGNOSIS — F32A Depression, unspecified: Secondary | ICD-10-CM | POA: Diagnosis present

## 2024-02-14 DIAGNOSIS — G9340 Encephalopathy, unspecified: Principal | ICD-10-CM

## 2024-02-14 DIAGNOSIS — Z992 Dependence on renal dialysis: Secondary | ICD-10-CM | POA: Diagnosis not present

## 2024-02-14 DIAGNOSIS — R590 Localized enlarged lymph nodes: Secondary | ICD-10-CM | POA: Diagnosis present

## 2024-02-14 DIAGNOSIS — Z1152 Encounter for screening for COVID-19: Secondary | ICD-10-CM | POA: Diagnosis not present

## 2024-02-14 DIAGNOSIS — L02214 Cutaneous abscess of groin: Secondary | ICD-10-CM | POA: Diagnosis present

## 2024-02-14 DIAGNOSIS — E1151 Type 2 diabetes mellitus with diabetic peripheral angiopathy without gangrene: Secondary | ICD-10-CM | POA: Diagnosis present

## 2024-02-14 DIAGNOSIS — L03116 Cellulitis of left lower limb: Secondary | ICD-10-CM | POA: Diagnosis present

## 2024-02-14 DIAGNOSIS — F419 Anxiety disorder, unspecified: Secondary | ICD-10-CM | POA: Diagnosis present

## 2024-02-14 DIAGNOSIS — Z833 Family history of diabetes mellitus: Secondary | ICD-10-CM

## 2024-02-14 DIAGNOSIS — Z8674 Personal history of sudden cardiac arrest: Secondary | ICD-10-CM

## 2024-02-14 DIAGNOSIS — L02416 Cutaneous abscess of left lower limb: Secondary | ICD-10-CM | POA: Diagnosis present

## 2024-02-14 DIAGNOSIS — E1143 Type 2 diabetes mellitus with diabetic autonomic (poly)neuropathy: Secondary | ICD-10-CM | POA: Diagnosis present

## 2024-02-14 DIAGNOSIS — R7981 Abnormal blood-gas level: Secondary | ICD-10-CM | POA: Diagnosis not present

## 2024-02-14 DIAGNOSIS — Z880 Allergy status to penicillin: Secondary | ICD-10-CM

## 2024-02-14 DIAGNOSIS — Z886 Allergy status to analgesic agent status: Secondary | ICD-10-CM

## 2024-02-14 DIAGNOSIS — E8809 Other disorders of plasma-protein metabolism, not elsewhere classified: Secondary | ICD-10-CM | POA: Diagnosis present

## 2024-02-14 DIAGNOSIS — B964 Proteus (mirabilis) (morganii) as the cause of diseases classified elsewhere: Secondary | ICD-10-CM | POA: Diagnosis present

## 2024-02-14 DIAGNOSIS — R6510 Systemic inflammatory response syndrome (SIRS) of non-infectious origin without acute organ dysfunction: Secondary | ICD-10-CM | POA: Diagnosis not present

## 2024-02-14 DIAGNOSIS — Z8711 Personal history of peptic ulcer disease: Secondary | ICD-10-CM

## 2024-02-14 DIAGNOSIS — A419 Sepsis, unspecified organism: Secondary | ICD-10-CM | POA: Diagnosis present

## 2024-02-14 DIAGNOSIS — Z8249 Family history of ischemic heart disease and other diseases of the circulatory system: Secondary | ICD-10-CM

## 2024-02-14 DIAGNOSIS — G9341 Metabolic encephalopathy: Secondary | ICD-10-CM | POA: Diagnosis present

## 2024-02-14 DIAGNOSIS — I480 Paroxysmal atrial fibrillation: Secondary | ICD-10-CM | POA: Diagnosis present

## 2024-02-14 DIAGNOSIS — Z91041 Radiographic dye allergy status: Secondary | ICD-10-CM

## 2024-02-14 DIAGNOSIS — Z794 Long term (current) use of insulin: Secondary | ICD-10-CM

## 2024-02-14 DIAGNOSIS — I953 Hypotension of hemodialysis: Secondary | ICD-10-CM | POA: Diagnosis not present

## 2024-02-14 DIAGNOSIS — Z881 Allergy status to other antibiotic agents status: Secondary | ICD-10-CM

## 2024-02-14 DIAGNOSIS — R1312 Dysphagia, oropharyngeal phase: Secondary | ICD-10-CM | POA: Diagnosis present

## 2024-02-14 DIAGNOSIS — Z6835 Body mass index (BMI) 35.0-35.9, adult: Secondary | ICD-10-CM

## 2024-02-14 DIAGNOSIS — Z885 Allergy status to narcotic agent status: Secondary | ICD-10-CM

## 2024-02-14 LAB — CBG MONITORING, ED
Glucose-Capillary: 164 mg/dL — ABNORMAL HIGH (ref 70–99)
Glucose-Capillary: 122 mg/dL — ABNORMAL HIGH (ref 70–99)

## 2024-02-14 LAB — COMPREHENSIVE METABOLIC PANEL WITH GFR
ALT: 10 U/L (ref 0–44)
AST: 13 U/L — ABNORMAL LOW (ref 15–41)
Albumin: 1.7 g/dL — ABNORMAL LOW (ref 3.5–5.0)
Alkaline Phosphatase: 109 U/L (ref 38–126)
Anion gap: 12 (ref 5–15)
BUN: 21 mg/dL — ABNORMAL HIGH (ref 6–20)
CO2: 27 mmol/L (ref 22–32)
Calcium: 8.8 mg/dL — ABNORMAL LOW (ref 8.9–10.3)
Chloride: 96 mmol/L — ABNORMAL LOW (ref 98–111)
Creatinine, Ser: 6.36 mg/dL — ABNORMAL HIGH (ref 0.44–1.00)
GFR, Estimated: 7 mL/min — ABNORMAL LOW (ref >60)
Glucose, Bld: 171 mg/dL — ABNORMAL HIGH (ref 70–99)
Potassium: 4.1 mmol/L (ref 3.5–5.1)
Sodium: 135 mmol/L (ref 135–145)
Total Bilirubin: 0.9 mg/dL (ref 0.0–1.2)
Total Protein: 6.3 g/dL — ABNORMAL LOW (ref 6.5–8.1)

## 2024-02-14 LAB — I-STAT CHEM 8, ED
BUN: 25 mg/dL — ABNORMAL HIGH (ref 6–20)
Calcium, Ion: 1.18 mmol/L (ref 1.15–1.40)
Chloride: 97 mmol/L — ABNORMAL LOW (ref 98–111)
Creatinine, Ser: 6.1 mg/dL — ABNORMAL HIGH (ref 0.44–1.00)
Glucose, Bld: 168 mg/dL — ABNORMAL HIGH (ref 70–99)
HCT: 36 % (ref 36.0–46.0)
Hemoglobin: 12.2 g/dL (ref 12.0–15.0)
Potassium: 4.1 mmol/L (ref 3.5–5.1)
Sodium: 134 mmol/L — ABNORMAL LOW (ref 135–145)
TCO2: 26 mmol/L (ref 22–32)

## 2024-02-14 LAB — CBC
HCT: 31.4 % — ABNORMAL LOW (ref 36.0–46.0)
Hemoglobin: 9.8 g/dL — ABNORMAL LOW (ref 12.0–15.0)
MCH: 29 pg (ref 26.0–34.0)
MCHC: 31.2 g/dL (ref 30.0–36.0)
MCV: 92.9 fL (ref 80.0–100.0)
Platelets: 212 K/uL (ref 150–400)
RBC: 3.38 MIL/uL — ABNORMAL LOW (ref 3.87–5.11)
RDW: 15.8 % — ABNORMAL HIGH (ref 11.5–15.5)
WBC: 39.9 K/uL — ABNORMAL HIGH (ref 4.0–10.5)
nRBC: 0 % (ref 0.0–0.2)

## 2024-02-14 LAB — I-STAT CG4 LACTIC ACID, ED: Lactic Acid, Venous: 2.1 mmol/L (ref 0.5–1.9)

## 2024-02-14 LAB — MAGNESIUM: Magnesium: 1.8 mg/dL (ref 1.7–2.4)

## 2024-02-14 LAB — RESP PANEL BY RT-PCR (RSV, FLU A&B, COVID)  RVPGX2
Influenza A by PCR: NEGATIVE
Influenza B by PCR: NEGATIVE
Resp Syncytial Virus by PCR: NEGATIVE
SARS Coronavirus 2 by RT PCR: NEGATIVE

## 2024-02-14 LAB — ETHANOL: Alcohol, Ethyl (B): <15 mg/dL (ref <15)

## 2024-02-14 MED ORDER — HEPARIN SODIUM (PORCINE) 5000 UNIT/ML IJ SOLN
5000.0000 [IU] | Freq: Three times a day (TID) | INTRAMUSCULAR | Status: DC
  Administered 2024-02-14 – 2024-02-25 (×30): 5000 [IU] via SUBCUTANEOUS
  Filled 2024-02-14 (×30): qty 1

## 2024-02-14 MED ORDER — INSULIN ASPART 100 UNIT/ML IJ SOLN: 0.0000 [IU] | INTRAMUSCULAR | Status: DC

## 2024-02-14 MED ORDER — LACTATED RINGERS IV SOLN: INTRAVENOUS | Status: DC

## 2024-02-14 MED ORDER — SODIUM CHLORIDE 0.9 % IV BOLUS
500.0000 mL | Freq: Once | INTRAVENOUS | Status: AC
  Administered 2024-02-14: 500 mL via INTRAVENOUS

## 2024-02-14 MED ORDER — SODIUM CHLORIDE 0.9 % IV SOLN
2.0000 g | Freq: Once | INTRAVENOUS | Status: AC
  Administered 2024-02-14: 2 g via INTRAVENOUS
  Filled 2024-02-14: qty 12.5

## 2024-02-14 MED ORDER — LACTATED RINGERS IV BOLUS (SEPSIS)
500.0000 mL | Freq: Once | INTRAVENOUS | Status: AC
  Administered 2024-02-14: 500 mL via INTRAVENOUS

## 2024-02-14 MED ORDER — METRONIDAZOLE 500 MG/100ML IV SOLN
500.0000 mg | Freq: Two times a day (BID) | INTRAVENOUS | Status: DC
  Administered 2024-02-15 – 2024-02-16 (×3): 500 mg via INTRAVENOUS
  Filled 2024-02-14 (×3): qty 100

## 2024-02-14 MED ORDER — SODIUM CHLORIDE 0.9 % IV SOLN: 2.0000 g | Freq: Once | INTRAVENOUS | Status: DC

## 2024-02-14 MED ORDER — VANCOMYCIN HCL IN DEXTROSE 1-5 GM/200ML-% IV SOLN: 1000.0000 mg | Freq: Once | INTRAVENOUS | Status: DC

## 2024-02-14 MED ORDER — VANCOMYCIN HCL 2000 MG/400ML IV SOLN
2000.0000 mg | Freq: Once | INTRAVENOUS | Status: AC
  Administered 2024-02-14: 2000 mg via INTRAVENOUS
  Filled 2024-02-14: qty 400

## 2024-02-14 MED ORDER — SODIUM CHLORIDE 0.9 % IV SOLN
1.0000 g | INTRAVENOUS | Status: DC
  Administered 2024-02-15: 1 g via INTRAVENOUS
  Filled 2024-02-14 (×2): qty 10

## 2024-02-14 MED ORDER — METRONIDAZOLE 500 MG/100ML IV SOLN
500.0000 mg | Freq: Once | INTRAVENOUS | Status: AC
  Administered 2024-02-14: 500 mg via INTRAVENOUS
  Filled 2024-02-14: qty 100

## 2024-02-14 MED ORDER — POLYETHYLENE GLYCOL 3350 17 G PO PACK: 17.0000 g | PACK | Freq: Every day | ORAL | Status: DC | PRN

## 2024-02-14 NOTE — ED Triage Notes (Signed)
 [facility] bring patient from dialysis.   The staff stated she is altered and lethargic. AxOx1. Normally AxOx4.   Slightly hypotensive, tachy.   Did not get dialysis.   Left sided deficits from previous stroke.   Left arm restriction.   EMS vitals and interventions: BP: 102/52 HR: 116 ST O2: 99 on RA RR: 14 Capno: 46 CBG: 326 No lines/meds.

## 2024-02-14 NOTE — Sepsis Progress Note (Signed)
 Sepsis protocol monitored by eLink

## 2024-02-14 NOTE — H&P (Signed)
 History and Physical    Patient: Deborah [name] FMW:[id number] DOB: [birth year] DOA: 02/14/2024 DOS: the patient was seen and examined on 02/14/2024 PCP: Pcp, No  Patient coming from: Home  Chief Complaint:  Chief Complaint  Patient presents with   Altered Mental Status   HPI: Deborah [name] is a 58 y.o. female with medical history significant of paroxysmal atrial fibrillation, diabetes mellitus type 2, chronic pain, hyperlipidemia, ESRD on HD. Patient unable to provide a history secondary to her mental status. Per ED report, patient was sent from her hemodialysis unit secondary to altered mental status. Patient reports some bilateral shoulder and abdominal pain. No other symptoms.  On arrival to the ED, patient found to have evidence of leukocytosis and mildly elevated lactic acid. Concern for possible sepsis, although no source identified. Also concern patient could have a component of uremic encephalopathy. Nephrology consulted for consideration of hemodialysis.   Review of Systems: As mentioned in the history of present illness. All other systems reviewed and are negative. Past Medical History:  Diagnosis Date   Acute back pain with sciatica, left    Acute back pain with sciatica, right    Acute encephalopathy 05/29/2022   Acute osteomyelitis of right calcaneus (HCC) 12/21/2022   Anemia, unspecified    Atrial fibrillation with RVR (HCC)-resolved 09/09/2022   Atypical chest pain 09/10/2021   Cancer (HCC)    Carcinoid tumor of duodenum (HCC)    Chest pain with normal coronary angiography 2019   Chronic a-fib (HCC) 09/09/2022   Chronic pain    Chronic systolic CHF (congestive heart failure) (HCC)    Dehiscence of amputation stump of right lower extremity (HCC) 01/29/2023   Diabetes mellitus    DKA (diabetic ketoacidosis) (HCC)    Drug-seeking behavior    21 hospitalizations and 14 CT a/p in 2 years for N/V and abdominal pain, demanding only IV dilaudid    Elevated  troponin    chronic   Esophageal reflux    Fibromyalgia    Gastric ulcer    Gastroparesis    Gout    HCAP (healthcare-associated pneumonia) 06/19/2022   Hyperlipidemia    Hyperosmolar hyperglycemic state (HHS) (HCC) 05/11/2022   Hyperosmolar non-ketotic state due to type 2 diabetes mellitus (HCC) 05/11/2022   Hypertension    Hypomagnesemia    Lumbosacral stenosis    LVH (left ventricular hypertrophy)    Morbid obesity (HCC)    Nausea & vomiting 09/09/2022   NICM (nonischemic cardiomyopathy) (HCC)    PAF (paroxysmal atrial fibrillation) (HCC)    Sepsis (HCC) 11/23/2022   Stroke (HCC) 02/2011   Symptomatic anemia 12/14/2022   Thrombocytosis    Vitamin B12 deficiency anemia    Past Surgical History:  Procedure Laterality Date   ABDOMINAL AORTOGRAM W/LOWER EXTREMITY N/A 11/29/2022   Procedure: ABDOMINAL AORTOGRAM W/LOWER EXTREMITY;  Surgeon: Pearline Norman RAMAN, MD;  Location: [facility];  Service: Cardiovascular;  Laterality: N/A;   AMPUTATION Right 12/29/2022   Procedure: RIGHT BELOW KNEE AMPUTATION;  Surgeon: Harden Jerona GAILS, MD;  Location: [facility] OR;  Service: Orthopedics;  Laterality: Right;   AV FISTULA PLACEMENT Left 06/30/2022   Procedure: LEFT BRACHIOCEPHALIC ARTERIOVENOUS (AV) FISTULA CREATION;  Surgeon: Gretta Lonni PARAS, MD;  Location: [facility] OR;  Service: Vascular;  Laterality: Left;   BIOPSY  07/27/2019   Procedure: BIOPSY;  Surgeon: Rosalie Kitchens, MD;  Location: [facility] ENDOSCOPY;  Service: Endoscopy;;   BIOPSY  07/30/2019   Procedure: BIOPSY;  Surgeon: Elicia Claw, MD;  Location: [facility] ENDOSCOPY;  Service: Gastroenterology;;   BIOPSY  04/28/2023   Procedure: BIOPSY;  Surgeon: Federico Rosario BROCKS, MD;  Location: [facility] ENDOSCOPY;  Service: Gastroenterology;;   CATARACT EXTRACTION  01/2014   CHOLECYSTECTOMY     COLONOSCOPY WITH PROPOFOL  N/A 07/30/2019   Procedure: COLONOSCOPY WITH PROPOFOL ;  Surgeon: Elicia Claw, MD;  Location: [facility] ENDOSCOPY;  Service: Gastroenterology;   Laterality: N/A;   ESOPHAGOGASTRODUODENOSCOPY N/A 07/27/2019   Procedure: ESOPHAGOGASTRODUODENOSCOPY (EGD);  Surgeon: Rosalie Kitchens, MD;  Location: [facility];  Service: Endoscopy;  Laterality: N/A;   ESOPHAGOGASTRODUODENOSCOPY N/A 07/26/2020   Procedure: ESOPHAGOGASTRODUODENOSCOPY (EGD);  Surgeon: Burnette Fallow, MD;  Location: [facility];  Service: Endoscopy;  Laterality: N/A;   ESOPHAGOGASTRODUODENOSCOPY (EGD) WITH PROPOFOL  N/A 08/02/2019   Procedure: ESOPHAGOGASTRODUODENOSCOPY (EGD) WITH PROPOFOL ;  Surgeon: Elicia Claw, MD;  Location: [facility] ENDOSCOPY;  Service: Gastroenterology;  Laterality: N/A;   ESOPHAGOGASTRODUODENOSCOPY (EGD) WITH PROPOFOL  N/A 12/23/2022   Procedure: ESOPHAGOGASTRODUODENOSCOPY (EGD) WITH PROPOFOL ;  Surgeon: San Sandor GAILS, DO;  Location: [facility] ENDOSCOPY;  Service: Gastroenterology;  Laterality: N/A;   ESOPHAGOGASTRODUODENOSCOPY (EGD) WITH PROPOFOL  N/A 04/28/2023   Procedure: ESOPHAGOGASTRODUODENOSCOPY (EGD) WITH PROPOFOL ;  Surgeon: Federico Rosario BROCKS, MD;  Location: [facility] ENDOSCOPY;  Service: Gastroenterology;  Laterality: N/A;   FISTULA SUPERFICIALIZATION Left 12/31/2022   Procedure: LEFT ARM FISTULA TRANSPOSITION;  Surgeon: Sheree Penne Bruckner, MD;  Location: [facility] OR;  Service: Vascular;  Laterality: Left;   GIVENS CAPSULE STUDY N/A 12/23/2022   Procedure: GIVENS CAPSULE STUDY;  Surgeon: San Sandor GAILS, DO;  Location: [facility] ENDOSCOPY;  Service: Gastroenterology;  Laterality: N/A;   HEMOSTASIS CLIP PLACEMENT  08/02/2019   Procedure: HEMOSTASIS CLIP PLACEMENT;  Surgeon: Elicia Claw, MD;  Location: [facility] ENDOSCOPY;  Service: Gastroenterology;;   HOT HEMOSTASIS N/A 12/23/2022   Procedure: HOT HEMOSTASIS (ARGON PLASMA COAGULATION/BICAP);  Surgeon: San Sandor GAILS, DO;  Location: [facility] ENDOSCOPY;  Service: Gastroenterology;  Laterality: N/A;   IR FLUORO GUIDE CV LINE RIGHT  06/24/2022   IR US  GUIDE VASC ACCESS RIGHT  06/24/2022   POLYPECTOMY  07/30/2019   Procedure:  POLYPECTOMY;  Surgeon: Elicia Claw, MD;  Location: [facility] ENDOSCOPY;  Service: Gastroenterology;;   POLYPECTOMY  08/02/2019   Procedure: POLYPECTOMY;  Surgeon: Elicia Claw, MD;  Location: [facility];  Service: Gastroenterology;;   HARLEY DILATION N/A 04/28/2023   Procedure: SAVORY DILATION;  Surgeon: Federico Rosario BROCKS, MD;  Location: [facility] ENDOSCOPY;  Service: Gastroenterology;  Laterality: N/A;   STUMP REVISION Right 02/02/2023   Procedure: REVISION RIGHT BELOW KNEE AMPUTATION;  Surgeon: Harden Jerona GAILS, MD;  Location: [facility] OR;  Service: Orthopedics;  Laterality: Right;   Social History:  reports that she has never smoked. She has never used smokeless tobacco. She reports that she does not drink alcohol and does not use drugs.  Allergies  Allergen Reactions   Gabapentin  Hives and [name]   Isovue  [Iopamidol ] Anaphylaxis, [name] and Other (See Comments)    11/28/17 Patient had seizure like activity and then 1 min code after 100 cc of isovue  300. Possible contrast allergy vs vasovagal episode  Cardiac Arrest   Nsaids Anaphylaxis and Other (See Comments)    Hx of stomach ulcers   Penicillins Itching, Palpitations and Other (See Comments)    Flushing (Red Skin) Laryngeal Edema   Reglan  [Metoclopramide ] Other (See Comments)    Tardive dyskinesia    Valium  [Diazepam ] [name]   Zestril [Lisinopril] Anaphylaxis and Swelling    Tongue and mouth swelling Laryngeal Edema   Tolectin [Tolmetin] Nausea And Vomiting, Nausea Only  and Other (See Comments)    Irritates stomach ulcer   Asa [Aspirin ] Other (See Comments)    Hx of stomach ulcer   Aspartame And Phenylalanine Hives   Bentyl  [Dicyclomine ] Other (See Comments)    Chest pain   Hibiclens  [Chlorhexidine  Gluconate] Other (See Comments)    Dermatitis    Flexeril  [Cyclobenzaprine ] Palpitations   Oxycontin  [Oxycodone ] Palpitations   Rifamycins Palpitations   Tylenol  [Acetaminophen ] Nausea And Vomiting,  Nausea Only and Other (See Comments)    Irritates stomach ulcer Abdominal pain   Ultram  [Tramadol ] Nausea And Vomiting and Palpitations    Family History  Problem Relation Age of Onset   Diabetes Mother    Diabetes Father    Heart disease Father    Diabetes Sister    Congestive Heart Failure Sister 58   Diabetes Brother     Prior to Admission medications   Medication Sig Start Date End Date Taking? Authorizing Provider  acetaminophen  (TYLENOL ) 325 MG tablet Take 1 tablet (325 mg total) by mouth 2 (two) times daily. 06/20/23   Danton Reyes DASEN, MD  albuterol  (PROVENTIL ) (2.5 MG/3ML) 0.083% nebulizer solution Take 3 mLs (2.5 mg total) by nebulization every 6 (six) hours as needed for wheezing or shortness of breath. 04/06/19   Arloa Suzen RAMAN, NP  allopurinol  (ZYLOPRIM ) 100 MG tablet Take 1 tablet (100 mg total) by mouth 2 (two) times daily. 11/30/22   Lue Elsie BROCKS, MD  ascorbic acid  (VITAMIN C ) 500 MG tablet Take 500 mg by mouth daily.    [provider]  atorvastatin  (LIPITOR) 10 MG tablet Take 10 mg by mouth every evening. 12/03/21   [provider]  b complex-vitamin c -folic acid  (NEPHRO-VITE) 0.8 MG TABS tablet Take 1 tablet by mouth at bedtime. 06/17/23   Danton Reyes DASEN, MD  busPIRone  (BUSPAR ) 5 MG tablet Take 1 tablet (5 mg total) by mouth 3 (three) times daily. 05/04/23   Sebastian Toribio GAILS, MD  calcium  carbonate (TUMS - DOSED IN MG ELEMENTAL CALCIUM ) 500 MG chewable tablet Chew 1 tablet (200 mg of elemental calcium  total) by mouth 2 (two) times daily as needed for indigestion or heartburn. 06/17/23   Danton Reyes DASEN, MD  cetirizine  (ZYRTEC ) 10 MG tablet Take 10 mg by mouth daily. 08/05/22   [provider]  cholestyramine  light (PREVALITE ) 4 g packet Take 1 packet (4 g total) by mouth 2 (two) times daily. 06/17/23   Danton Reyes DASEN, MD  diclofenac  Sodium (VOLTAREN ) 1 % GEL Apply 2 g topically 4 (four) times daily. 06/17/23   Danton Reyes DASEN, MD  DULoxetine  (CYMBALTA ) 30 MG capsule Take 1 capsule (30 mg total) by mouth daily. 06/17/23   Danton Reyes DASEN, MD  famotidine  (PEPCID ) 20 MG tablet Take 20 mg by mouth daily before breakfast.    [provider]  ferric citrate  (AURYXIA ) 1 GM 210 MG(Fe) tablet Take 2 tablets (420 mg total) by mouth 3 (three) times daily with meals. 06/17/23   Danton Reyes DASEN, MD  fluticasone  (FLONASE ) 50 MCG/ACT nasal spray Place 2 sprays into both nostrils daily as needed for allergies or rhinitis. 12/19/18   Rai, Ripudeep MARLA, MD  folic acid  (FOLVITE ) 1 MG tablet Take 1 tablet (1 mg total) by mouth daily. 01/10/23   Danford, Lonni SQUIBB, MD  hydrocortisone  cream 1 % Apply 1 Application topically 2 (two) times daily.    [provider]  HYDROmorphone  (DILAUDID ) 4 MG tablet Take 1 tablet (4 mg total) by mouth every  6 (six) hours as needed for severe pain (pain score 7-10). 06/20/23   Danton Reyes DASEN, MD  hydrOXYzine  (ATARAX ) 25 MG tablet Take 1 tablet (25 mg total) by mouth every 4 (four) hours as needed for anxiety. 06/17/23   Danton Reyes DASEN, MD  hyoscyamine  (LEVSIN  SL) 0.125 MG SL tablet Place 1 tablet (0.125 mg total) under the tongue 3 (three) times daily. 06/17/23   Danton Reyes DASEN, MD  insulin  aspart (NOVOLOG ) 100 UNIT/ML injection Inject 0-6 Units into the skin 3 (three) times daily with meals. 06/17/23   Danton Reyes DASEN, MD  loperamide  (IMODIUM ) 2 MG capsule Take 1 capsule (2 mg total) by mouth every 6 (six) hours. 06/17/23   Danton Reyes DASEN, MD  methocarbamol  (ROBAXIN ) 500 MG tablet Take 1 tablet (500 mg total) by mouth every 6 (six) hours as needed for muscle spasms. 01/10/23   Jonel Lonni SQUIBB, MD  midodrine  (PROAMATINE ) 10 MG tablet Take 1 tablet (10 mg total) by mouth every Monday, Wednesday, and Friday with hemodialysis. 06/17/23   Danton Reyes DASEN, MD  mirtazapine  (REMERON ) 15 MG tablet Take 15 mg by mouth at bedtime. 10/18/22   [provider]   Nutritional Supplements (,FEEDING SUPPLEMENT, PROSOURCE PLUS) liquid Take 30 mLs by mouth 2 (two) times daily between meals. 01/10/23   Danford, Lonni SQUIBB, MD  ondansetron  (ZOFRAN -ODT) 4 MG disintegrating tablet Take 1 tablet (4 mg total) by mouth every 8 (eight) hours as needed for nausea or vomiting. 05/04/23   Sebastian Toribio GAILS, MD  pantoprazole  (PROTONIX ) 40 MG tablet Take 1 tablet (40 mg total) by mouth 2 (two) times daily. Patient taking differently: Take 40 mg by mouth daily. 02/18/23   Raulkar, Sven SQUIBB, MD  promethazine  (PHENERGAN ) 25 MG tablet Take 1 tablet (25 mg total) by mouth 4 (four) times daily -  before meals and at bedtime. 06/17/23   Danton Reyes DASEN, MD  risperiDONE  (RISPERDAL  M-TABS) 1 MG disintegrating tablet Take 1 tablet (1 mg total) by mouth at bedtime. 05/04/23   Sebastian Toribio GAILS, MD  saccharomyces boulardii (FLORASTOR) 250 MG capsule Take 250 mg by mouth in the morning, at noon, and at bedtime.    [provider]  traZODone  (DESYREL ) 50 MG tablet Take 1 tablet (50 mg total) by mouth at bedtime. 06/17/23   Danton Reyes DASEN, MD    Physical Exam: BP 90/63   Pulse 70   Temp 98 F (36.7 C) (Oral)   Resp 19   LMP 10/10/2012   SpO2 94%   General exam: Appears calm and comfortable and in no acute distress. HEENT: Oropharynx appears dry Respiratory: Diminished but clear to auscultation. Respiratory effort normal with no intercostal retractions or use of accessory muscles Cardiovascular: S1 & S2 heard, RRR. No murmurs, rubs, gallops or clicks. 2+ LLE pitting edema Gastrointestinal: Abdomen is non-distended, soft and non-tender. No masses felt. Normal bowel sounds heard Neurologic: Lethargic but arouses to voice command and is oriented x3. Seems to have maybe mild asterixis Musculoskeletal: No calf tenderness. Right BKA Skin: No cyanosis. No new rashes Psychiatry:  Memory impaired.  Data Reviewed: There are no new results to review at this  time.  Assessment and Plan:  Altered mental status Possibly uremic encephalopathy versus sepsis related.  Patient answers some questions appropriately and follows commands at this time.  Patient unable to keep her eyes open for consistent amount of time. - Continue n.p.o. - Follow-up nephrology recommendations for possible hemodialysis today  SIRS Present on admission  based on leukocytosis and tachycardia.  Unclear etiology/no source identified. Afebrile. Patient has a right chest hemodialysis catheter with no obvious evidence of infection on visual inspection.  Chest x-ray and head CT unremarkable for etiology.  Patient with bilateral shoulder pain and some abdominal pain, however no other symptoms.  Abdominal exam is benign.  Blood culture ordered but unclear if was obtained before or after antibiotics. Patient was started empirically on vancomycin , cefepime , metronidazole . - Add on differential to CBC in addition to technologist smear review - Continue vancomycin , cefepime , Flagyl   ESRD on hemodialysis Patient presented from her hemodialysis center without having started hemodialysis today. Nephrology consulted by the EDP for evaluation this admission.  Patient has a right shoulder double-lumen hemodialysis catheter. -Follow-up nephrology recommendations  Diabetes mellitus type 2 Well-controlled based on prior hemoglobin A1c from 2024 of 5.7%. - SSI every 4 hours while patient is altered/NPO  Chronic hypotension Complicated by hemodialysis. Patient is on midodrine  before hemodialysis as an outpatient.  Currently blood pressure is low-normal/stable.  Hyperlipidemia Noted.  Patient is listed as taking Lipitor as an outpatient, however last dispensed in October.  History of paroxysmal atrial fibrillation Noted.  Patient does not have anticoagulation or rate control medication list on home medication list.  Patient is currently in sinus rhythm.  Gout Patient listed as taking  allopurinol  as an outpatient.  Last dispensed in October.  Unclear if she is currently taking at this time.  Depression Anxiety Patient is listed as taking BuSpar , risperidone , trazodone , Remeron .  Unclear which medication she is actually taking at this time.  Unable to complete med reconcile.  History of nausea vomiting Patient without current symptoms.    Advance Care Planning:   Code Status: Full Code  Consults: Nephrology  Family Communication: None at bedside. Called mother, but no answer.    Author: Elgin Lam, MD 02/14/2024 3:30 PM  For on call review [email or web address].

## 2024-02-14 NOTE — ED Notes (Signed)
 Unable to find palpable vein for venipuncture blood draw.

## 2024-02-14 NOTE — Progress Notes (Signed)
 Pharmacy Antibiotic Note  Deborah [name] is a 58 y.o. female for which pharmacy has been consulted for cefepime  and vancomycin  dosing for sepsis.  Patient with a history of AF, DM, T2DM, HLD, ESRD on HD. Patient presenting with AMS.  Flagyl  x 1 in ED  Plan: Cefepime  2g given x 1 in ED --Resume with 1g q24h (give after HD) Vancomycin  2000 mg given x1 in ED --repeat dosing per HD schedule Monitor WBC, fever, renal function, cultures De-escalate when able F/u Nephrology plan     Temp (24hrs), Avg:98 F (36.7 C), Min:98 F (36.7 C), Max:98 F (36.7 C)  Recent Labs  Lab 02/14/24 1402 02/14/24 1413  WBC 39.9*  --   CREATININE 6.36* 6.10*  LATICACIDVEN  --  2.1*    CrCl cannot be calculated (Unknown ideal weight.).    Allergies  Allergen Reactions   Gabapentin  Hives and [name]   Isovue  [Iopamidol ] Anaphylaxis, [name] and Other (See Comments)    11/28/17 Patient had seizure like activity and then 1 min code after 100 cc of isovue  300. Possible contrast allergy vs vasovagal episode  Cardiac Arrest   Nsaids Anaphylaxis and Other (See Comments)    Hx of stomach ulcers   Penicillins Itching, Palpitations and Other (See Comments)    Flushing (Red Skin) Laryngeal Edema   Reglan  [Metoclopramide ] Other (See Comments)    Tardive dyskinesia    Valium  [Diazepam ] [name]   Zestril [Lisinopril] Anaphylaxis and Swelling    Tongue and mouth swelling Laryngeal Edema   Tolectin [Tolmetin] Nausea And Vomiting, Nausea Only and Other (See Comments)    Irritates stomach ulcer   Dorethia Ditto ] Other (See Comments)    Hx of stomach ulcer   Aspartame And Phenylalanine Hives   Bentyl  [Dicyclomine ] Other (See Comments)    Chest pain   Hibiclens  [Chlorhexidine  Gluconate] Other (See Comments)    Dermatitis    Flexeril  [Cyclobenzaprine ] Palpitations   Oxycontin  [Oxycodone ] Palpitations   Rifamycins Palpitations   Tylenol  [Acetaminophen ] Nausea And  Vomiting, Nausea Only and Other (See Comments)    Irritates stomach ulcer Abdominal pain   Ultram  [Tramadol ] Nausea And Vomiting and Palpitations   Microbiology results: Pending  Thank you for allowing pharmacy to be a part of this patient's care.  Dorn Buttner, PharmD, BCPS 02/14/2024 4:16 PM ED Clinical Pharmacist -  [phone]

## 2024-02-14 NOTE — ED Provider Notes (Signed)
 [place] EMERGENCY DEPARTMENT AT [facility] Provider Note   [name]: [id number] Arrival date & time: 02/14/24  1303     Patient presents with: Altered Mental Status   Deborah [name] is a 58 y.o. female.   HPI Patient presents via EMS from dialysis after being altered on the machine. Patient from facility to dialysis, normally ANO x 4, today ANO x 1.  EMS reports patient was mildly tachycardic, hypotensive, awake, though disoriented in transport. Patient awakens to mild stimulus, states that she feels not right, denies pain.  falls asleep again quickly.    Prior to Admission medications   Medication Sig Start Date End Date Taking? Authorizing Provider  acetaminophen  (TYLENOL ) 325 MG tablet Take 1 tablet (325 mg total) by mouth 2 (two) times daily. 06/20/23   Danton Reyes DASEN, MD  albuterol  (PROVENTIL ) (2.5 MG/3ML) 0.083% nebulizer solution Take 3 mLs (2.5 mg total) by nebulization every 6 (six) hours as needed for wheezing or shortness of breath. 04/06/19   Arloa Suzen RAMAN, NP  allopurinol  (ZYLOPRIM ) 100 MG tablet Take 1 tablet (100 mg total) by mouth 2 (two) times daily. 11/30/22   Lue Elsie BROCKS, MD  ascorbic acid  (VITAMIN C ) 500 MG tablet Take 500 mg by mouth daily.    [provider]  atorvastatin  (LIPITOR) 10 MG tablet Take 10 mg by mouth every evening. 12/03/21   [provider]  b complex-vitamin c -folic acid  (NEPHRO-VITE) 0.8 MG TABS tablet Take 1 tablet by mouth at bedtime. 06/17/23   Danton Reyes DASEN, MD  busPIRone  (BUSPAR ) 5 MG tablet Take 1 tablet (5 mg total) by mouth 3 (three) times daily. 05/04/23   Sebastian Toribio GAILS, MD  calcium  carbonate (TUMS - DOSED IN MG ELEMENTAL CALCIUM ) 500 MG chewable tablet Chew 1 tablet (200 mg of elemental calcium  total) by mouth 2 (two) times daily as needed for indigestion or heartburn. 06/17/23   Danton Reyes DASEN, MD  cetirizine  (ZYRTEC ) 10 MG tablet Take 10 mg by mouth daily. 08/05/22   [provider]  cholestyramine  light (PREVALITE ) 4 g packet Take 1 packet (4 g total) by mouth 2 (two) times daily. 06/17/23   Danton Reyes DASEN, MD  diclofenac  Sodium (VOLTAREN ) 1 % GEL Apply 2 g topically 4 (four) times daily. 06/17/23   Danton Reyes DASEN, MD  DULoxetine  (CYMBALTA ) 30 MG capsule Take 1 capsule (30 mg total) by mouth daily. 06/17/23   Danton Reyes DASEN, MD  famotidine  (PEPCID ) 20 MG tablet Take 20 mg by mouth daily before breakfast.    [provider]  ferric citrate  (AURYXIA ) 1 GM 210 MG(Fe) tablet Take 2 tablets (420 mg total) by mouth 3 (three) times daily with meals. 06/17/23   Danton Reyes DASEN, MD  fluticasone  (FLONASE ) 50 MCG/ACT nasal spray Place 2 sprays into both nostrils daily as needed for allergies or rhinitis. 12/19/18   Rai, Ripudeep MARLA, MD  folic acid  (FOLVITE ) 1 MG tablet Take 1 tablet (1 mg total) by mouth daily. 01/10/23   Danford, Lonni SQUIBB, MD  hydrocortisone  cream 1 % Apply 1 Application topically 2 (two) times daily.    [provider]  HYDROmorphone  (DILAUDID ) 4 MG tablet Take 1 tablet (4 mg total) by mouth every 6 (six) hours as needed for severe pain (pain score 7-10). 06/20/23   Danton Reyes DASEN, MD  hydrOXYzine  (ATARAX ) 25 MG tablet Take 1 tablet (25 mg total) by mouth every 4 (four) hours as needed for anxiety. 06/17/23   McClung,  Reyes DASEN, MD  hyoscyamine  (LEVSIN  SL) 0.125 MG SL tablet Place 1 tablet (0.125 mg total) under the tongue 3 (three) times daily. 06/17/23   Danton Reyes DASEN, MD  insulin  aspart (NOVOLOG ) 100 UNIT/ML injection Inject 0-6 Units into the skin 3 (three) times daily with meals. 06/17/23   Danton Reyes DASEN, MD  loperamide  (IMODIUM ) 2 MG capsule Take 1 capsule (2 mg total) by mouth every 6 (six) hours. 06/17/23   Danton Reyes DASEN, MD  methocarbamol  (ROBAXIN ) 500 MG tablet Take 1 tablet (500 mg total) by mouth every 6 (six) hours as needed for muscle spasms. 01/10/23   Jonel Lonni SQUIBB, MD  midodrine   (PROAMATINE ) 10 MG tablet Take 1 tablet (10 mg total) by mouth every Monday, Wednesday, and Friday with hemodialysis. 06/17/23   Danton Reyes DASEN, MD  mirtazapine  (REMERON ) 15 MG tablet Take 15 mg by mouth at bedtime. 10/18/22   [provider]  Nutritional Supplements (,FEEDING SUPPLEMENT, PROSOURCE PLUS) liquid Take 30 mLs by mouth 2 (two) times daily between meals. 01/10/23   Danford, Lonni SQUIBB, MD  ondansetron  (ZOFRAN -ODT) 4 MG disintegrating tablet Take 1 tablet (4 mg total) by mouth every 8 (eight) hours as needed for nausea or vomiting. 05/04/23   Sebastian Toribio GAILS, MD  pantoprazole  (PROTONIX ) 40 MG tablet Take 1 tablet (40 mg total) by mouth 2 (two) times daily. Patient taking differently: Take 40 mg by mouth daily. 02/18/23   Raulkar, Sven SQUIBB, MD  promethazine  (PHENERGAN ) 25 MG tablet Take 1 tablet (25 mg total) by mouth 4 (four) times daily -  before meals and at bedtime. 06/17/23   Danton Reyes DASEN, MD  risperiDONE  (RISPERDAL  M-TABS) 1 MG disintegrating tablet Take 1 tablet (1 mg total) by mouth at bedtime. 05/04/23   Sebastian Toribio GAILS, MD  saccharomyces boulardii (FLORASTOR) 250 MG capsule Take 250 mg by mouth in the morning, at noon, and at bedtime.    [provider]  traZODone  (DESYREL ) 50 MG tablet Take 1 tablet (50 mg total) by mouth at bedtime. 06/17/23   Danton Reyes DASEN, MD    Allergies: Gabapentin , Isovue  [iopamidol ], Nsaids, Penicillins, Reglan  [metoclopramide ], Valium  [diazepam ], Zestril [lisinopril], Tolectin [tolmetin], Asa [aspirin ], Aspartame and phenylalanine, Bentyl  [dicyclomine ], Hibiclens  [chlorhexidine  gluconate], Flexeril  [cyclobenzaprine ], Oxycontin  [oxycodone ], Rifamycins, Tylenol  [acetaminophen ], and Ultram  [tramadol ]    Review of Systems  Updated Vital Signs BP 100/67   Pulse (!) 110   Temp 98 F (36.7 C) (Oral)   Resp 18   LMP 10/10/2012   SpO2 100%   Physical Exam Vitals and nursing note reviewed.  Constitutional:       General: She is not in acute distress.    Appearance: She is well-developed.  HENT:     Head: Normocephalic and atraumatic.  Eyes:     Conjunctiva/sclera: Conjunctivae normal.  Cardiovascular:     Rate and Rhythm: Regular rhythm. Tachycardia present.  Pulmonary:     Effort: Pulmonary effort is normal. No respiratory distress.     Breath sounds: Normal breath sounds. No stridor.  Chest:    Abdominal:     General: There is no distension.     Tenderness: There is no guarding.  Musculoskeletal:     Comments: Right below the knee amputation.  Skin:    General: Skin is warm and dry.  Neurological:     Cranial Nerves: No cranial nerve deficit.     Comments: Face is symmetric speech is very brief, but clear, patient moves all extremity spontaneously.  Psychiatric:  Mood and Affect: Mood normal.     (all labs ordered are listed, but only abnormal results are displayed) Labs Reviewed  COMPREHENSIVE METABOLIC PANEL WITH GFR - Abnormal; Notable for the following components:      Result Value   Chloride 96 (*)    Glucose, Bld 171 (*)    BUN 21 (*)    Creatinine, Ser 6.36 (*)    Calcium  8.8 (*)    Total Protein 6.3 (*)    Albumin  1.7 (*)    AST 13 (*)    GFR, Estimated 7 (*)    All other components within normal limits  CBC - Abnormal; Notable for the following components:   WBC 39.9 (*)    RBC 3.38 (*)    Hemoglobin 9.8 (*)    HCT 31.4 (*)    RDW 15.8 (*)    All other components within normal limits  CBG MONITORING, ED - Abnormal; Notable for the following components:   Glucose-Capillary 164 (*)    All other components within normal limits  I-STAT CHEM 8, ED - Abnormal; Notable for the following components:   Sodium 134 (*)    Chloride 97 (*)    BUN 25 (*)    Creatinine, Ser 6.10 (*)    Glucose, Bld 168 (*)    All other components within normal limits  I-STAT CG4 LACTIC ACID, ED - Abnormal; Notable for the following components:   Lactic Acid, Venous 2.1 (*)     All other components within normal limits  CULTURE, BLOOD (SINGLE)  ETHANOL  MAGNESIUM     EKG: EKG Interpretation Date/Time:  Tuesday February 14 2024 13:10:38 EST Ventricular Rate:  119 PR Interval:  170 QRS Duration:  94 QT Interval:  336 QTC Calculation: 473 R Axis:   -48  Text Interpretation: Sinus tachycardia Supraventricular bigeminy Left anterior fascicular block Nonspecific T abnrm, anterolateral leads Confirmed by Garrick Charleston [phone]) on 02/14/2024 1:53:33 PM  Radiology: [facility] Chest Port 1 View Result Date: 02/14/2024 EXAM: 1 VIEW(S) XRAY OF THE CHEST 02/14/2024 02:14:22 PM COMPARISON: 05/29/2023 CLINICAL HISTORY: AMS FINDINGS: LINES, TUBES AND DEVICES: Stable tunneled right chest dialysis catheter in place. LUNGS AND PLEURA: Low lung volumes. No focal pulmonary opacity. No pleural effusion. No pneumothorax. HEART AND MEDIASTINUM: No acute abnormality of the cardiac and mediastinal silhouettes. BONES AND SOFT TISSUES: Bilateral shoulder DJD. No acute osseous abnormality. IMPRESSION: 1. No acute findings. 2. Stable tunneled right chest dialysis catheter in place. Electronically signed by: Franky Stanford MD 02/14/2024 03:09 PM EST RP Workstation: [id number]   CT Head Wo Contrast Result Date: 02/14/2024 EXAM: CT HEAD WITHOUT 02/14/2024 02:53:00 PM TECHNIQUE: CT of the head was performed without the administration of intravenous contrast. Automated exposure control, iterative reconstruction, and/or weight based adjustment of the mA/kV was utilized to reduce the radiation dose to as low as reasonably achievable. COMPARISON: 01/01/2023 CLINICAL HISTORY: Mental status change, unknown cause FINDINGS: BRAIN AND VENTRICLES: No acute intracranial hemorrhage. No mass effect or midline shift. No extra-axial fluid collection. No evidence of acute infarct. No hydrocephalus. Atherosclerotic calcifications within cavernous internal carotid arteries. ORBITS: Bilateral lens replacement. No acute  abnormality. SINUSES AND MASTOIDS: No acute abnormality. SOFT TISSUES AND SKULL: No acute skull fracture. No acute soft tissue abnormality. IMPRESSION: 1. No acute intracranial abnormality. Electronically signed by: Franky Stanford MD 02/14/2024 03:09 PM EST RP Workstation: [id number]     Procedures   Medications Ordered in the ED  lactated ringers  infusion (has no administration in time range)  lactated ringers  bolus 500 mL (has no administration in time range)  metroNIDAZOLE  (FLAGYL ) IVPB 500 mg (has no administration in time range)  ceFEPIme  (MAXIPIME ) 2 g in sodium chloride  0.9 % 100 mL IVPB (2 g Intravenous New Bag/Given 02/14/24 1510)  vancomycin  (VANCOREADY) IVPB 2000 mg/400 mL (has no administration in time range)  sodium chloride  0.9 % bolus 500 mL (0 mLs Intravenous Stopped 02/14/24 1509)                                    Medical Decision Making Adult female, end-stage renal disease presents with confusion, listlessness from dialysis. Broad differential including fluid status, electrolytes, infection, uremic encephalopathy. Cardiac 110 sinus tach abnormal pulse ox 99% room air normal  Amount and/or Complexity of Data Reviewed Independent Historian: EMS    Details: EMS rhythm strip sinus tach, 117, ST abnormalities External Data Reviewed: notes. Labs: ordered. Decision-making details documented in ED Course. Radiology: ordered and independent interpretation performed. Decision-making details documented in ED Course. ECG/medicine tests: ordered and independent interpretation performed. Decision-making details documented in ED Course.  Risk Prescription drug management. Decision regarding hospitalization. Diagnosis or treatment significantly limited by social determinants of health.   2:35 PM Patient with leukocytosis 40,000.  With initial tachycardia, questionable hypotension, patient meets sepsis criteria, broad-spectrum antibiotics ordered. Initial fluids still running,  additional bolus ordered.  3:12 PM Patient now more awake, speaking freely, moving her neck, side-to-side, no meningismus.  Patient's initial labs most notable as above for leukocytosis 40,000, with lactic acidosis she has received empiric antibiotics.  Given concern for altered mental status patient will be admitted for further monitoring, management.  CRITICAL CARE Performed by: Lamar Salen Total critical care time: 35 minutes Critical care time was exclusive of separately billable procedures and treating other patients. Critical care was necessary to treat or prevent imminent or life-threatening deterioration. Critical care was time spent personally by me on the following activities: development of treatment plan with patient and/or surrogate as well as nursing, discussions with consultants, evaluation of patient's response to treatment, examination of patient, obtaining history from patient or surrogate, ordering and performing treatments and interventions, ordering and review of laboratory studies, ordering and review of radiographic studies, pulse oximetry and re-evaluation of patient's condition.   Final diagnoses:  Acute encephalopathy  Severe sepsis [facility])    ED Discharge Orders     None          Salen Lamar, MD 02/14/24 1513

## 2024-02-14 NOTE — ED Provider Notes (Incomplete Revision)
 [place] EMERGENCY DEPARTMENT AT [facility] Provider Note   [name]: [id number] Arrival date & time: 02/14/24  1303     Patient presents with: Altered Mental Status   Deborah [name] is a 58 y.o. female.   HPI Patient presents via EMS from dialysis after being altered on the machine. Patient from facility to dialysis, normally ANO x 4, today ANO x 1.  EMS reports patient was mildly tachycardic, hypotensive, awake, though disoriented in transport. Patient awakens to mild stimulus, states that she feels not right, denies pain.  falls asleep again quickly.    Prior to Admission medications   Medication Sig Start Date End Date Taking? Authorizing Provider  acetaminophen  (TYLENOL ) 325 MG tablet Take 1 tablet (325 mg total) by mouth 2 (two) times daily. 06/20/23   Danton Reyes DASEN, MD  albuterol  (PROVENTIL ) (2.5 MG/3ML) 0.083% nebulizer solution Take 3 mLs (2.5 mg total) by nebulization every 6 (six) hours as needed for wheezing or shortness of breath. 04/06/19   Arloa Suzen RAMAN, NP  allopurinol  (ZYLOPRIM ) 100 MG tablet Take 1 tablet (100 mg total) by mouth 2 (two) times daily. 11/30/22   Lue Elsie BROCKS, MD  ascorbic acid  (VITAMIN C ) 500 MG tablet Take 500 mg by mouth daily.    [provider]  atorvastatin  (LIPITOR) 10 MG tablet Take 10 mg by mouth every evening. 12/03/21   [provider]  b complex-vitamin c -folic acid  (NEPHRO-VITE) 0.8 MG TABS tablet Take 1 tablet by mouth at bedtime. 06/17/23   Danton Reyes DASEN, MD  busPIRone  (BUSPAR ) 5 MG tablet Take 1 tablet (5 mg total) by mouth 3 (three) times daily. 05/04/23   Sebastian Toribio GAILS, MD  calcium  carbonate (TUMS - DOSED IN MG ELEMENTAL CALCIUM ) 500 MG chewable tablet Chew 1 tablet (200 mg of elemental calcium  total) by mouth 2 (two) times daily as needed for indigestion or heartburn. 06/17/23   Danton Reyes DASEN, MD  cetirizine  (ZYRTEC ) 10 MG tablet Take 10 mg by mouth daily. 08/05/22   [provider]  cholestyramine  light (PREVALITE ) 4 g packet Take 1 packet (4 g total) by mouth 2 (two) times daily. 06/17/23   Danton Reyes DASEN, MD  diclofenac  Sodium (VOLTAREN ) 1 % GEL Apply 2 g topically 4 (four) times daily. 06/17/23   Danton Reyes DASEN, MD  DULoxetine  (CYMBALTA ) 30 MG capsule Take 1 capsule (30 mg total) by mouth daily. 06/17/23   Danton Reyes DASEN, MD  famotidine  (PEPCID ) 20 MG tablet Take 20 mg by mouth daily before breakfast.    [provider]  ferric citrate  (AURYXIA ) 1 GM 210 MG(Fe) tablet Take 2 tablets (420 mg total) by mouth 3 (three) times daily with meals. 06/17/23   Danton Reyes DASEN, MD  fluticasone  (FLONASE ) 50 MCG/ACT nasal spray Place 2 sprays into both nostrils daily as needed for allergies or rhinitis. 12/19/18   Rai, Ripudeep MARLA, MD  folic acid  (FOLVITE ) 1 MG tablet Take 1 tablet (1 mg total) by mouth daily. 01/10/23   Danford, Lonni SQUIBB, MD  hydrocortisone  cream 1 % Apply 1 Application topically 2 (two) times daily.    [provider]  HYDROmorphone  (DILAUDID ) 4 MG tablet Take 1 tablet (4 mg total) by mouth every 6 (six) hours as needed for severe pain (pain score 7-10). 06/20/23   Danton Reyes DASEN, MD  hydrOXYzine  (ATARAX ) 25 MG tablet Take 1 tablet (25 mg total) by mouth every 4 (four) hours as needed for anxiety. 06/17/23   McClung,  Reyes DASEN, MD  hyoscyamine  (LEVSIN  SL) 0.125 MG SL tablet Place 1 tablet (0.125 mg total) under the tongue 3 (three) times daily. 06/17/23   Danton Reyes DASEN, MD  insulin  aspart (NOVOLOG ) 100 UNIT/ML injection Inject 0-6 Units into the skin 3 (three) times daily with meals. 06/17/23   Danton Reyes DASEN, MD  loperamide  (IMODIUM ) 2 MG capsule Take 1 capsule (2 mg total) by mouth every 6 (six) hours. 06/17/23   Danton Reyes DASEN, MD  methocarbamol  (ROBAXIN ) 500 MG tablet Take 1 tablet (500 mg total) by mouth every 6 (six) hours as needed for muscle spasms. 01/10/23   Jonel Lonni SQUIBB, MD  midodrine   (PROAMATINE ) 10 MG tablet Take 1 tablet (10 mg total) by mouth every Monday, Wednesday, and Friday with hemodialysis. 06/17/23   Danton Reyes DASEN, MD  mirtazapine  (REMERON ) 15 MG tablet Take 15 mg by mouth at bedtime. 10/18/22   [provider]  Nutritional Supplements (,FEEDING SUPPLEMENT, PROSOURCE PLUS) liquid Take 30 mLs by mouth 2 (two) times daily between meals. 01/10/23   Danford, Lonni SQUIBB, MD  ondansetron  (ZOFRAN -ODT) 4 MG disintegrating tablet Take 1 tablet (4 mg total) by mouth every 8 (eight) hours as needed for nausea or vomiting. 05/04/23   Sebastian Toribio GAILS, MD  pantoprazole  (PROTONIX ) 40 MG tablet Take 1 tablet (40 mg total) by mouth 2 (two) times daily. Patient taking differently: Take 40 mg by mouth daily. 02/18/23   Raulkar, Sven SQUIBB, MD  promethazine  (PHENERGAN ) 25 MG tablet Take 1 tablet (25 mg total) by mouth 4 (four) times daily -  before meals and at bedtime. 06/17/23   Danton Reyes DASEN, MD  risperiDONE  (RISPERDAL  M-TABS) 1 MG disintegrating tablet Take 1 tablet (1 mg total) by mouth at bedtime. 05/04/23   Sebastian Toribio GAILS, MD  saccharomyces boulardii (FLORASTOR) 250 MG capsule Take 250 mg by mouth in the morning, at noon, and at bedtime.    [provider]  traZODone  (DESYREL ) 50 MG tablet Take 1 tablet (50 mg total) by mouth at bedtime. 06/17/23   Danton Reyes DASEN, MD    Allergies: Gabapentin , Isovue  [iopamidol ], Nsaids, Penicillins, Reglan  Finlee.found ], Valium  [diazepam ], Zestril [lisinopril], Tolectin [tolmetin], Asa [aspirin ], Aspartame and phenylalanine, Bentyl  [dicyclomine ], Hibiclens  [chlorhexidine  gluconate], Flexeril  [cyclobenzaprine ], Oxycontin  [oxycodone ], Rifamycins, Tylenol  [acetaminophen ], and Ultram  [tramadol ]    Review of Systems  Updated Vital Signs BP 100/67   Pulse (!) 110   Temp 98 F (36.7 C) (Oral)   Resp 18   LMP 10/10/2012   SpO2 100%   Physical Exam Vitals and nursing note reviewed.  Constitutional:       General: She is not in acute distress.    Appearance: She is well-developed.  HENT:     Head: Normocephalic and atraumatic.  Eyes:     Conjunctiva/sclera: Conjunctivae normal.  Cardiovascular:     Rate and Rhythm: Regular rhythm. Tachycardia present.  Pulmonary:     Effort: Pulmonary effort is normal. No respiratory distress.     Breath sounds: Normal breath sounds. No stridor.  Chest:    Abdominal:     General: There is no distension.     Tenderness: There is no guarding.  Musculoskeletal:     Comments: Right below the knee amputation.  Skin:    General: Skin is warm and dry.  Neurological:     Cranial Nerves: No cranial nerve deficit.     Comments: Face is symmetric speech is very brief, but clear, patient moves all extremity spontaneously.  Psychiatric:  Mood and Affect: Mood normal.     (all labs ordered are listed, but only abnormal results are displayed) Labs Reviewed  COMPREHENSIVE METABOLIC PANEL WITH GFR - Abnormal; Notable for the following components:      Result Value   Chloride 96 (*)    Glucose, Bld 171 (*)    BUN 21 (*)    Creatinine, Ser 6.36 (*)    Calcium  8.8 (*)    Total Protein 6.3 (*)    Albumin  1.7 (*)    AST 13 (*)    GFR, Estimated 7 (*)    All other components within normal limits  CBC - Abnormal; Notable for the following components:   WBC 39.9 (*)    RBC 3.38 (*)    Hemoglobin 9.8 (*)    HCT 31.4 (*)    RDW 15.8 (*)    All other components within normal limits  CBG MONITORING, ED - Abnormal; Notable for the following components:   Glucose-Capillary 164 (*)    All other components within normal limits  I-STAT CHEM 8, ED - Abnormal; Notable for the following components:   Sodium 134 (*)    Chloride 97 (*)    BUN 25 (*)    Creatinine, Ser 6.10 (*)    Glucose, Bld 168 (*)    All other components within normal limits  I-STAT CG4 LACTIC ACID, ED - Abnormal; Notable for the following components:   Lactic Acid, Venous 2.1 (*)     All other components within normal limits  CULTURE, BLOOD (SINGLE)  ETHANOL  MAGNESIUM     EKG: EKG Interpretation Date/Time:  Tuesday February 14 2024 13:10:38 EST Ventricular Rate:  119 PR Interval:  170 QRS Duration:  94 QT Interval:  336 QTC Calculation: 473 R Axis:   -48  Text Interpretation: Sinus tachycardia Supraventricular bigeminy Left anterior fascicular block Nonspecific T abnrm, anterolateral leads Confirmed by Garrick Charleston [id number]) on 02/14/2024 1:53:33 PM  Radiology: [facility] Chest Port 1 View Result Date: 02/14/2024 EXAM: 1 VIEW(S) XRAY OF THE CHEST 02/14/2024 02:14:22 PM COMPARISON: 05/29/2023 CLINICAL HISTORY: AMS FINDINGS: LINES, TUBES AND DEVICES: Stable tunneled right chest dialysis catheter in place. LUNGS AND PLEURA: Low lung volumes. No focal pulmonary opacity. No pleural effusion. No pneumothorax. HEART AND MEDIASTINUM: No acute abnormality of the cardiac and mediastinal silhouettes. BONES AND SOFT TISSUES: Bilateral shoulder DJD. No acute osseous abnormality. IMPRESSION: 1. No acute findings. 2. Stable tunneled right chest dialysis catheter in place. Electronically signed by: Franky Stanford MD 02/14/2024 03:09 PM EST RP Workstation: [id number]   CT Head Wo Contrast Result Date: 02/14/2024 EXAM: CT HEAD WITHOUT 02/14/2024 02:53:00 PM TECHNIQUE: CT of the head was performed without the administration of intravenous contrast. Automated exposure control, iterative reconstruction, and/or weight based adjustment of the mA/kV was utilized to reduce the radiation dose to as low as reasonably achievable. COMPARISON: 01/01/2023 CLINICAL HISTORY: Mental status change, unknown cause FINDINGS: BRAIN AND VENTRICLES: No acute intracranial hemorrhage. No mass effect or midline shift. No extra-axial fluid collection. No evidence of acute infarct. No hydrocephalus. Atherosclerotic calcifications within cavernous internal carotid arteries. ORBITS: Bilateral lens replacement. No acute  abnormality. SINUSES AND MASTOIDS: No acute abnormality. SOFT TISSUES AND SKULL: No acute skull fracture. No acute soft tissue abnormality. IMPRESSION: 1. No acute intracranial abnormality. Electronically signed by: Franky Stanford MD 02/14/2024 03:09 PM EST RP Workstation: [id number]     Procedures   Medications Ordered in the ED  lactated ringers  infusion (has no administration in time range)  lactated ringers  bolus 500 mL (has no administration in time range)  metroNIDAZOLE  (FLAGYL ) IVPB 500 mg (has no administration in time range)  ceFEPIme  (MAXIPIME ) 2 g in sodium chloride  0.9 % 100 mL IVPB (2 g Intravenous New Bag/Given 02/14/24 1510)  vancomycin  (VANCOREADY) IVPB 2000 mg/400 mL (has no administration in time range)  sodium chloride  0.9 % bolus 500 mL (0 mLs Intravenous Stopped 02/14/24 1509)                                    Medical Decision Making Adult female, end-stage renal disease presents with confusion, listlessness from dialysis. Broad differential including fluid status, electrolytes, infection, uremic encephalopathy. Cardiac 110 sinus tach abnormal pulse ox 99% room air normal  Amount and/or Complexity of Data Reviewed Independent Historian: EMS    Details: EMS rhythm strip sinus tach, 117, ST abnormalities External Data Reviewed: notes. Labs: ordered. Decision-making details documented in ED Course. Radiology: ordered and independent interpretation performed. Decision-making details documented in ED Course. ECG/medicine tests: ordered and independent interpretation performed. Decision-making details documented in ED Course.  Risk Prescription drug management. Decision regarding hospitalization. Diagnosis or treatment significantly limited by social determinants of health.   2:35 PM Patient with leukocytosis 40,000.  With initial tachycardia, questionable hypotension, patient meets sepsis criteria, broad-spectrum antibiotics ordered. Initial fluids still running,  additional bolus ordered.  3:12 PM Patient now more awake, speaking freely, moving her neck, side-to-side, no meningismus.  Patient's initial labs most notable as above for leukocytosis 40,000, with lactic acidosis she has received empiric antibiotics.  Given concern for altered mental status patient will be admitted for further monitoring, management.  Patient occasionally hypoxic, though not reliably so and when she is stimulated saturation becomes 90%.  Patient on 2 L nasal cannula.  CRITICAL CARE Performed by: Lamar Salen Total critical care time: 35 minutes Critical care time was exclusive of separately billable procedures and treating other patients. Critical care was necessary to treat or prevent imminent or life-threatening deterioration. Critical care was time spent personally by me on the following activities: development of treatment plan with patient and/or surrogate as well as nursing, discussions with consultants, evaluation of patient's response to treatment, examination of patient, obtaining history from patient or surrogate, ordering and performing treatments and interventions, ordering and review of laboratory studies, ordering and review of radiographic studies, pulse oximetry and re-evaluation of patient's condition.   Final diagnoses:  Acute encephalopathy  Severe sepsis [facility])    ED Discharge Orders     None          Salen Lamar, MD 02/14/24 1513

## 2024-02-14 NOTE — Consult Note (Signed)
 Renal Service Consult Note [facility] Lamar JONETTA Fret, MD  Patient: Deborah [name] Date: 02/14/2024 Requesting Physician: Dr. Briana   Reason for Consult: ESRD pt w/ AMS HPI: The patient is a 58 y.o. year-old w/ PMH as below who presented to ED w/ AMS. Work-up showed WBC 39K, temp 98, BP 100/70, RR 18-23, HR 110-115. K+ 4.1, bun 21, creat 6.36.  Alb 1.7. Sepsis was suspected and pt is receiving IVF's and IV abx in the ED. Plan is for admission to medical service. We are asked to see for dialysis.    Pt seen in ED room. Pt is answering but is confused, keeps eyes closed. Wants to eat something. No CP or SOB.    ROS - denies CP, no joint pain, no HA, no blurry vision, no rash, no diarrhea, no nausea/ vomiting   Past Medical History  Past Medical History:  Diagnosis Date   Acute back pain with sciatica, left    Acute back pain with sciatica, right    Acute encephalopathy 05/29/2022   Acute osteomyelitis of right calcaneus (HCC) 12/21/2022   Anemia, unspecified    Atrial fibrillation with RVR (HCC)-resolved 09/09/2022   Atypical chest pain 09/10/2021   Cancer (HCC)    Carcinoid tumor of duodenum (HCC)    Chest pain with normal coronary angiography 2019   Chronic a-fib (HCC) 09/09/2022   Chronic pain    Chronic systolic CHF (congestive heart failure) (HCC)    Dehiscence of amputation stump of right lower extremity (HCC) 01/29/2023   Diabetes mellitus    DKA (diabetic ketoacidosis) (HCC)    Drug-seeking behavior    21 hospitalizations and 14 CT a/p in 2 years for N/V and abdominal pain, demanding only IV dilaudid    Elevated troponin    chronic   Esophageal reflux    Fibromyalgia    Gastric ulcer    Gastroparesis    Gout    HCAP (healthcare-associated pneumonia) 06/19/2022   Hyperlipidemia    Hyperosmolar hyperglycemic state (HHS) (HCC) 05/11/2022   Hyperosmolar non-ketotic state due to type 2 diabetes mellitus (HCC) 05/11/2022   Hypertension     Hypomagnesemia    Lumbosacral stenosis    LVH (left ventricular hypertrophy)    Morbid obesity (HCC)    Nausea & vomiting 09/09/2022   NICM (nonischemic cardiomyopathy) (HCC)    PAF (paroxysmal atrial fibrillation) (HCC)    Sepsis (HCC) 11/23/2022   Stroke (HCC) 02/2011   Symptomatic anemia 12/14/2022   Thrombocytosis    Vitamin B12 deficiency anemia    Past Surgical History  Past Surgical History:  Procedure Laterality Date   ABDOMINAL AORTOGRAM W/LOWER EXTREMITY N/A 11/29/2022   Procedure: ABDOMINAL AORTOGRAM W/LOWER EXTREMITY;  Surgeon: Pearline Norman RAMAN, MD;  Location: [facility];  Service: Cardiovascular;  Laterality: N/A;   AMPUTATION Right 12/29/2022   Procedure: RIGHT BELOW KNEE AMPUTATION;  Surgeon: Harden Jerona GAILS, MD;  Location: [facility] OR;  Service: Orthopedics;  Laterality: Right;   AV FISTULA PLACEMENT Left 06/30/2022   Procedure: LEFT BRACHIOCEPHALIC ARTERIOVENOUS (AV) FISTULA CREATION;  Surgeon: Gretta Lonni PARAS, MD;  Location: [facility] OR;  Service: Vascular;  Laterality: Left;   BIOPSY  07/27/2019   Procedure: BIOPSY;  Surgeon: Rosalie Kitchens, MD;  Location: [facility];  Service: Endoscopy;;   BIOPSY  07/30/2019   Procedure: BIOPSY;  Surgeon: Elicia Claw, MD;  Location: [facility];  Service: Gastroenterology;;   BIOPSY  04/28/2023   Procedure: BIOPSY;  Surgeon: Federico Rosario BROCKS, MD;  Location:  [facility];  Service: Gastroenterology;;   CATARACT EXTRACTION  01/2014   CHOLECYSTECTOMY     COLONOSCOPY WITH PROPOFOL  N/A 07/30/2019   Procedure: COLONOSCOPY WITH PROPOFOL ;  Surgeon: Elicia Claw, MD;  Location: [facility];  Service: Gastroenterology;  Laterality: N/A;   ESOPHAGOGASTRODUODENOSCOPY N/A 07/27/2019   Procedure: ESOPHAGOGASTRODUODENOSCOPY (EGD);  Surgeon: Rosalie Kitchens, MD;  Location: [facility];  Service: Endoscopy;  Laterality: N/A;   ESOPHAGOGASTRODUODENOSCOPY N/A 07/26/2020   Procedure: ESOPHAGOGASTRODUODENOSCOPY (EGD);  Surgeon: Burnette Fallow, MD;   Location: [facility];  Service: Endoscopy;  Laterality: N/A;   ESOPHAGOGASTRODUODENOSCOPY (EGD) WITH PROPOFOL  N/A 08/02/2019   Procedure: ESOPHAGOGASTRODUODENOSCOPY (EGD) WITH PROPOFOL ;  Surgeon: Elicia Claw, MD;  Location: [facility];  Service: Gastroenterology;  Laterality: N/A;   ESOPHAGOGASTRODUODENOSCOPY (EGD) WITH PROPOFOL  N/A 12/23/2022   Procedure: ESOPHAGOGASTRODUODENOSCOPY (EGD) WITH PROPOFOL ;  Surgeon: San Sandor GAILS, DO;  Location: [facility];  Service: Gastroenterology;  Laterality: N/A;   ESOPHAGOGASTRODUODENOSCOPY (EGD) WITH PROPOFOL  N/A 04/28/2023   Procedure: ESOPHAGOGASTRODUODENOSCOPY (EGD) WITH PROPOFOL ;  Surgeon: Federico Rosario BROCKS, MD;  Location: [facility] ENDOSCOPY;  Service: Gastroenterology;  Laterality: N/A;   FISTULA SUPERFICIALIZATION Left 12/31/2022   Procedure: LEFT ARM FISTULA TRANSPOSITION;  Surgeon: Sheree Penne Bruckner, MD;  Location: [facility] OR;  Service: Vascular;  Laterality: Left;   GIVENS CAPSULE STUDY N/A 12/23/2022   Procedure: GIVENS CAPSULE STUDY;  Surgeon: San Sandor GAILS, DO;  Location: [facility]OS[facility];  Service: Gastroenterology;  Laterality: N/A;   HEMOSTASIS CLIP PLACEMENT  08/02/2019   Procedure: HEMOSTASIS CLIP PLACEMENT;  Surgeon: Elicia Claw, MD;  Location: [facility];  Service: Gastroenterology;;   HOT HEMOSTASIS N/A 12/23/2022   Procedure: HOT HEMOSTASIS (ARGON PLASMA COAGULATION/BICAP);  Surgeon: San Sandor GAILS, DO;  Location[facility]COPY;  Service: Gastroenterology;  Laterality: N/A;   IR FLUORO GUIDE CV LINE RIGHT  06/24/2022   IR US  GUIDE VASC ACCESS RIGHT  06/24/2022   POLYPECTOMY  07/30/2019   Procedure: POLYPECTOMY;  Surgeon: Elicia Claw, MD;  Location: [facility];  Service: Gastroenterology;;   POLYPECTOMY  08/02/2019   Procedure: POLYPECTOMY;  Surgeon: Elicia Claw, MD;  Location: [facility];  Service: Gastroenterology;;   HARLEY DILATION N/A 04/28/2023   Procedure: SAVORY DILATION;  Surgeon: Federico Rosario BROCKS,  MD;  Location: [facility] ENDOSCOPY;  Service: Gastroenterology;  Laterality: N/A;   STUMP REVISION Right 02/02/2023   Procedure: REVISION RIGHT BELOW KNEE AMPUTATION;  Surgeon: Harden Jerona GAILS, MD;  Location: [facility] OR;  Service: Orthopedics;  Laterality: Right;   Family History  Family History  Problem Relation Age of Onset   Diabetes Mother    Diabetes Father    Heart disease Father    Diabetes Sister    Congestive Heart Failure Sister 54   Diabetes Brother    Social History  reports that she has never smoked. She has never used smokeless tobacco. She reports that she does not drink alcohol and does not use drugs. Allergies  Allergies  Allergen Reactions   Gabapentin  Hives and [name]   Isovue  [Iopamidol ] Anaphylaxis, [name] and Other (See Comments)    11/28/17 Patient had seizure like activity and then 1 min code after 100 cc of isovue  300. Possible contrast allergy vs vasovagal episode  Cardiac Arrest   Nsaids Anaphylaxis and Other (See Comments)    Hx of stomach ulcers   Penicillins Itching, Palpitations and Other (See Comments)    Flushing (Red Skin) Laryngeal Edema   Reglan  [Metoclopramide ] Other (See Comments)    Tardive dyskinesia    Valium  [Diazepam ]  [name]   Zestril [Lisinopril] Anaphylaxis and Swelling    Tongue and mouth swelling Laryngeal Edema   Tolectin [Tolmetin] Nausea And Vomiting, Nausea Only and Other (See Comments)    Irritates stomach ulcer   Asa [Aspirin ] Other (See Comments)    Hx of stomach ulcer   Aspartame And Phenylalanine Hives   Bentyl  [Dicyclomine ] Other (See Comments)    Chest pain   Hibiclens  [Chlorhexidine  Gluconate] Other (See Comments)    Dermatitis    Flexeril  [Cyclobenzaprine ] Palpitations   Oxycontin  [Oxycodone ] Palpitations   Rifamycins Palpitations   Tylenol  [Acetaminophen ] Nausea And Vomiting, Nausea Only and Other (See Comments)    Irritates stomach ulcer Abdominal pain   Ultram  [Tramadol ] Nausea  And Vomiting and Palpitations   Home medications Prior to Admission medications   Medication Sig Start Date End Date Taking? Authorizing Provider  acetaminophen  (TYLENOL ) 325 MG tablet Take 1 tablet (325 mg total) by mouth 2 (two) times daily. 06/20/23   Danton Reyes DASEN, MD  albuterol  (PROVENTIL ) (2.5 MG/3ML) 0.083% nebulizer solution Take 3 mLs (2.5 mg total) by nebulization every 6 (six) hours as needed for wheezing or shortness of breath. 04/06/19   Arloa Suzen RAMAN, NP  allopurinol  (ZYLOPRIM ) 100 MG tablet Take 1 tablet (100 mg total) by mouth 2 (two) times daily. 11/30/22   Lue Elsie BROCKS, MD  ascorbic acid  (VITAMIN C ) 500 MG tablet Take 500 mg by mouth daily.    [provider]  atorvastatin  (LIPITOR) 10 MG tablet Take 10 mg by mouth every evening. 12/03/21   [provider]  b complex-vitamin c -folic acid  (NEPHRO-VITE) 0.8 MG TABS tablet Take 1 tablet by mouth at bedtime. 06/17/23   Danton Reyes DASEN, MD  busPIRone  (BUSPAR ) 5 MG tablet Take 1 tablet (5 mg total) by mouth 3 (three) times daily. 05/04/23   Sebastian Toribio GAILS, MD  calcium  carbonate (TUMS - DOSED IN MG ELEMENTAL CALCIUM ) 500 MG chewable tablet Chew 1 tablet (200 mg of elemental calcium  total) by mouth 2 (two) times daily as needed for indigestion or heartburn. 06/17/23   Danton Reyes DASEN, MD  cetirizine  (ZYRTEC ) 10 MG tablet Take 10 mg by mouth daily. 08/05/22   [provider]  cholestyramine  light (PREVALITE ) 4 g packet Take 1 packet (4 g total) by mouth 2 (two) times daily. 06/17/23   Danton Reyes DASEN, MD  diclofenac  Sodium (VOLTAREN ) 1 % GEL Apply 2 g topically 4 (four) times daily. 06/17/23   Danton Reyes DASEN, MD  DULoxetine  (CYMBALTA ) 30 MG capsule Take 1 capsule (30 mg total) by mouth daily. 06/17/23   Danton Reyes DASEN, MD  famotidine  (PEPCID ) 20 MG tablet Take 20 mg by mouth daily before breakfast.    [provider]  ferric citrate  (AURYXIA ) 1 GM 210 MG(Fe) tablet Take 2  tablets (420 mg total) by mouth 3 (three) times daily with meals. 06/17/23   Danton Reyes DASEN, MD  fluticasone  (FLONASE ) 50 MCG/ACT nasal spray Place 2 sprays into both nostrils daily as needed for allergies or rhinitis. 12/19/18   Rai, Ripudeep MARLA, MD  folic acid  (FOLVITE ) 1 MG tablet Take 1 tablet (1 mg total) by mouth daily. 01/10/23   Danford, Lonni SQUIBB, MD  hydrocortisone  cream 1 % Apply 1 Application topically 2 (two) times daily.    [provider]  HYDROmorphone  (DILAUDID ) 4 MG tablet Take 1 tablet (4 mg total) by mouth every 6 (six) hours as needed for severe pain (pain score 7-10). 06/20/23   McClung,  Reyes DASEN, MD  hydrOXYzine  (ATARAX ) 25 MG tablet Take 1 tablet (25 mg total) by mouth every 4 (four) hours as needed for anxiety. 06/17/23   Danton Reyes DASEN, MD  hyoscyamine  (LEVSIN  SL) 0.125 MG SL tablet Place 1 tablet (0.125 mg total) under the tongue 3 (three) times daily. 06/17/23   Danton Reyes DASEN, MD  insulin  aspart (NOVOLOG ) 100 UNIT/ML injection Inject 0-6 Units into the skin 3 (three) times daily with meals. 06/17/23   Danton Reyes DASEN, MD  loperamide  (IMODIUM ) 2 MG capsule Take 1 capsule (2 mg total) by mouth every 6 (six) hours. 06/17/23   Danton Reyes DASEN, MD  methocarbamol  (ROBAXIN ) 500 MG tablet Take 1 tablet (500 mg total) by mouth every 6 (six) hours as needed for muscle spasms. 01/10/23   Jonel Lonni SQUIBB, MD  midodrine  (PROAMATINE ) 10 MG tablet Take 1 tablet (10 mg total) by mouth every Monday, Wednesday, and Friday with hemodialysis. 06/17/23   Danton Reyes DASEN, MD  mirtazapine  (REMERON ) 15 MG tablet Take 15 mg by mouth at bedtime. 10/18/22   [provider]  Nutritional Supplements (,FEEDING SUPPLEMENT, PROSOURCE PLUS) liquid Take 30 mLs by mouth 2 (two) times daily between meals. 01/10/23   Danford, Lonni SQUIBB, MD  ondansetron  (ZOFRAN -ODT) 4 MG disintegrating tablet Take 1 tablet (4 mg total) by mouth every 8 (eight) hours as needed for nausea  or vomiting. 05/04/23   Sebastian Toribio GAILS, MD  pantoprazole  (PROTONIX ) 40 MG tablet Take 1 tablet (40 mg total) by mouth 2 (two) times daily. Patient taking differently: Take 40 mg by mouth daily. 02/18/23   Raulkar, Sven SQUIBB, MD  promethazine  (PHENERGAN ) 25 MG tablet Take 1 tablet (25 mg total) by mouth 4 (four) times daily -  before meals and at bedtime. 06/17/23   Danton Reyes DASEN, MD  risperiDONE  (RISPERDAL  M-TABS) 1 MG disintegrating tablet Take 1 tablet (1 mg total) by mouth at bedtime. 05/04/23   Sebastian Toribio GAILS, MD  saccharomyces boulardii (FLORASTOR) 250 MG capsule Take 250 mg by mouth in the morning, at noon, and at bedtime.    [provider]  traZODone  (DESYREL ) 50 MG tablet Take 1 tablet (50 mg total) by mouth at bedtime. 06/17/23   Danton Reyes T, MD     Vitals:   02/14/24 1615 02/14/24 1630 02/14/24 1645 02/14/24 1646  BP: 90/71 96/66 107/71   Pulse: (!) 106 (!) 101 (!) 106   Resp: 20 19 (!) 23   Temp:    98.1 F (36.7 C)  TempSrc:    Oral  SpO2: 100% 100% 100%    Exam Gen alert, no distress Sclera anicteric, throat clear  No jvd or bruits Chest clear bilat to bases RRR no MRG Abd soft ntnd no mass or ascites +bs Ext *** LE or UE edema, no other edema Neuro is alert, Ox 3 , nf    ***  Home bp meds: Midodrine  10mg  pre HD TTS   OP HD: [facility] From April 2025 -> 4h  B400 101.5kg  TDC  Heparin  none    Assessment/ Plan: *** ESRD: on HD TTS.  HTN/ BP: *** Volume: *** Anemia of esrd:        Myer Fret  MD [facility] 02/14/2024, 4:47 PM  Recent Labs  Lab 02/14/24 1402 02/14/24 1413  HGB 9.8* 12.2  ALBUMIN  1.7*  --   CALCIUM  8.8*  --   CREATININE 6.36* 6.10*  K 4.1 4.1   Inpatient medications:   lactated ringers   150 mL/hr at 02/14/24 1622   metronidazole  500 mg (02/14/24 1609)   vancomycin  2,000 mg (02/14/24 1645)

## 2024-02-15 LAB — CBC WITH DIFFERENTIAL/PLATELET
Abs Immature Granulocytes: 1.39 K/uL — ABNORMAL HIGH (ref 0.00–0.07)
Basophils Absolute: 0.2 K/uL — ABNORMAL HIGH (ref 0.0–0.1)
Basophils Relative: 1 %
Eosinophils Absolute: 0.2 K/uL (ref 0.0–0.5)
Eosinophils Relative: 0 %
HCT: 25.3 % — ABNORMAL LOW (ref 36.0–46.0)
Hemoglobin: 8.1 g/dL — ABNORMAL LOW (ref 12.0–15.0)
Immature Granulocytes: 4 %
Lymphocytes Relative: 4 %
Lymphs Abs: 1.5 K/uL (ref 0.7–4.0)
MCH: 29.7 pg (ref 26.0–34.0)
MCHC: 32 g/dL (ref 30.0–36.0)
MCV: 92.7 fL (ref 80.0–100.0)
Monocytes Absolute: 2 K/uL — ABNORMAL HIGH (ref 0.1–1.0)
Monocytes Relative: 6 %
Neutro Abs: 29.8 K/uL — ABNORMAL HIGH (ref 1.7–7.7)
Neutrophils Relative %: 85 %
Platelets: 183 K/uL (ref 150–400)
RBC: 2.73 MIL/uL — ABNORMAL LOW (ref 3.87–5.11)
RDW: 15.8 % — ABNORMAL HIGH (ref 11.5–15.5)
Smear Review: NORMAL
WBC: 35 K/uL — ABNORMAL HIGH (ref 4.0–10.5)
nRBC: 0 % (ref 0.0–0.2)

## 2024-02-15 LAB — GLUCOSE, CAPILLARY: Glucose-Capillary: 172 mg/dL — ABNORMAL HIGH (ref 70–99)

## 2024-02-15 LAB — AMMONIA: Ammonia: 29 umol/L (ref 9–35)

## 2024-02-15 LAB — COMPREHENSIVE METABOLIC PANEL WITH GFR
ALT: 8 U/L (ref 0–44)
AST: 10 U/L — ABNORMAL LOW (ref 15–41)
Albumin: <1.5 g/dL — ABNORMAL LOW (ref 3.5–5.0)
Alkaline Phosphatase: 87 U/L (ref 38–126)
Anion gap: 14 (ref 5–15)
BUN: 24 mg/dL — ABNORMAL HIGH (ref 6–20)
CO2: 21 mmol/L — ABNORMAL LOW (ref 22–32)
Calcium: 8.3 mg/dL — ABNORMAL LOW (ref 8.9–10.3)
Chloride: 99 mmol/L (ref 98–111)
Creatinine, Ser: 6.24 mg/dL — ABNORMAL HIGH (ref 0.44–1.00)
GFR, Estimated: 7 mL/min — ABNORMAL LOW (ref >60)
Glucose, Bld: 109 mg/dL — ABNORMAL HIGH (ref 70–99)
Potassium: 3.8 mmol/L (ref 3.5–5.1)
Sodium: 134 mmol/L — ABNORMAL LOW (ref 135–145)
Total Bilirubin: 0.9 mg/dL (ref 0.0–1.2)
Total Protein: 4.8 g/dL — ABNORMAL LOW (ref 6.5–8.1)

## 2024-02-15 LAB — LACTIC ACID, PLASMA: Lactic Acid, Venous: 0.6 mmol/L (ref 0.5–1.9)

## 2024-02-15 LAB — CBG MONITORING, ED
Glucose-Capillary: 105 mg/dL — ABNORMAL HIGH (ref 70–99)
Glucose-Capillary: 93 mg/dL (ref 70–99)
Glucose-Capillary: 104 mg/dL — ABNORMAL HIGH (ref 70–99)

## 2024-02-15 LAB — HEMOGLOBIN A1C
Hgb A1c MFr Bld: 4.3 % — ABNORMAL LOW (ref 4.8–5.6)
Mean Plasma Glucose: 76.71 mg/dL

## 2024-02-15 LAB — TECHNOLOGIST SMEAR REVIEW: Plt Morphology: NORMAL

## 2024-02-15 LAB — HEPATITIS B SURFACE ANTIGEN: Hepatitis B Surface Ag: NONREACTIVE

## 2024-02-15 MED ORDER — HEPARIN SODIUM (PORCINE) 1000 UNIT/ML IJ SOLN
INTRAMUSCULAR | Status: AC
  Filled 2024-02-15: qty 4

## 2024-02-15 MED ORDER — ALTEPLASE 2 MG IJ SOLR: 2.0000 mg | Freq: Once | INTRAMUSCULAR | Status: DC | PRN

## 2024-02-15 MED ORDER — NEPRO/CARBSTEADY PO LIQD: 237.0000 mL | ORAL | Status: DC | PRN

## 2024-02-15 MED ORDER — HYOSCYAMINE SULFATE 0.125 MG SL SUBL
0.1250 mg | SUBLINGUAL_TABLET | Freq: Three times a day (TID) | SUBLINGUAL | Status: DC
  Administered 2024-02-15 – 2024-02-25 (×20): 0.125 mg via SUBLINGUAL
  Filled 2024-02-15 (×34): qty 1

## 2024-02-15 MED ORDER — CHOLESTYRAMINE LIGHT 4 G PO PACK
4.0000 g | PACK | Freq: Two times a day (BID) | ORAL | Status: DC
  Administered 2024-02-15 – 2024-02-18 (×2): 4 g via ORAL
  Filled 2024-02-15 (×7): qty 1

## 2024-02-15 MED ORDER — VANCOMYCIN HCL IN DEXTROSE 1-5 GM/200ML-% IV SOLN
1000.0000 mg | Freq: Once | INTRAVENOUS | Status: AC
  Administered 2024-02-15: 1000 mg via INTRAVENOUS
  Filled 2024-02-15: qty 200

## 2024-02-15 MED ORDER — FERRIC CITRATE 1 GM 210 MG(FE) PO TABS
420.0000 mg | ORAL_TABLET | Freq: Three times a day (TID) | ORAL | Status: DC
  Administered 2024-02-17 – 2024-02-25 (×18): 420 mg via ORAL
  Filled 2024-02-15 (×33): qty 2

## 2024-02-15 MED ORDER — ANTICOAGULANT SODIUM CITRATE 4% (200MG/5ML) IV SOLN: 5.0000 mL | Status: DC | PRN

## 2024-02-15 MED ORDER — MIDODRINE HCL 5 MG PO TABS
ORAL_TABLET | ORAL | Status: AC
  Filled 2024-02-15: qty 2

## 2024-02-15 MED ORDER — MIDODRINE HCL 5 MG PO TABS
10.0000 mg | ORAL_TABLET | Freq: Three times a day (TID) | ORAL | Status: DC
  Administered 2024-02-15 – 2024-02-18 (×11): 10 mg via ORAL
  Filled 2024-02-15 (×12): qty 2

## 2024-02-15 MED ORDER — LIDOCAINE HCL (PF) 1 % IJ SOLN: 5.0000 mL | INTRAMUSCULAR | Status: DC | PRN

## 2024-02-15 MED ORDER — PROSOURCE PLUS PO LIQD
30.0000 mL | Freq: Two times a day (BID) | ORAL | Status: DC
  Administered 2024-02-15: 30 mL via ORAL
  Filled 2024-02-15 (×4): qty 30

## 2024-02-15 MED ORDER — INSULIN ASPART 100 UNIT/ML IJ SOLN
0.0000 [IU] | Freq: Three times a day (TID) | INTRAMUSCULAR | Status: DC
  Administered 2024-02-18 – 2024-02-19 (×4): 1 [IU] via SUBCUTANEOUS
  Administered 2024-02-19 – 2024-02-20 (×2): 2 [IU] via SUBCUTANEOUS
  Filled 2024-02-15 (×2): qty 1
  Filled 2024-02-15: qty 2
  Filled 2024-02-15: qty 1
  Filled 2024-02-15: qty 2
  Filled 2024-02-15: qty 1

## 2024-02-15 MED ORDER — LIDOCAINE-PRILOCAINE 2.5-2.5 % EX CREA: 1.0000 | TOPICAL_CREAM | CUTANEOUS | Status: DC | PRN

## 2024-02-15 MED ORDER — ONDANSETRON 4 MG PO TBDP: 4.0000 mg | ORAL_TABLET | Freq: Three times a day (TID) | ORAL | Status: DC | PRN

## 2024-02-15 MED ORDER — PENTAFLUOROPROP-TETRAFLUOROETH EX AERO: 1.0000 | INHALATION_SPRAY | CUTANEOUS | Status: DC | PRN

## 2024-02-15 MED ORDER — PANTOPRAZOLE SODIUM 40 MG PO TBEC
40.0000 mg | DELAYED_RELEASE_TABLET | Freq: Every day | ORAL | Status: DC
  Administered 2024-02-18 – 2024-02-25 (×8): 40 mg via ORAL
  Filled 2024-02-15 (×10): qty 1

## 2024-02-15 MED ORDER — ALLOPURINOL 100 MG PO TABS
100.0000 mg | ORAL_TABLET | Freq: Two times a day (BID) | ORAL | Status: DC
  Administered 2024-02-15 – 2024-02-18 (×3): 100 mg via ORAL
  Filled 2024-02-15 (×5): qty 1

## 2024-02-15 MED ORDER — VANCOMYCIN VARIABLE DOSE PER UNSTABLE RENAL FUNCTION (PHARMACIST DOSING): Status: DC

## 2024-02-15 MED ORDER — FAMOTIDINE 20 MG PO TABS
20.0000 mg | ORAL_TABLET | Freq: Every day | ORAL | Status: DC
  Administered 2024-02-18: 20 mg via ORAL
  Filled 2024-02-15 (×2): qty 1

## 2024-02-15 MED ORDER — ALBUTEROL SULFATE (2.5 MG/3ML) 0.083% IN NEBU: 2.5000 mg | INHALATION_SOLUTION | Freq: Four times a day (QID) | RESPIRATORY_TRACT | Status: DC | PRN

## 2024-02-15 MED ORDER — ATORVASTATIN CALCIUM 10 MG PO TABS
10.0000 mg | ORAL_TABLET | Freq: Every evening | ORAL | Status: DC
  Administered 2024-02-15 – 2024-02-18 (×3): 10 mg via ORAL
  Filled 2024-02-15 (×3): qty 1

## 2024-02-15 MED ORDER — FOLIC ACID 1 MG PO TABS
1.0000 mg | ORAL_TABLET | Freq: Every day | ORAL | Status: DC
  Administered 2024-02-18: 1 mg via ORAL
  Filled 2024-02-15 (×2): qty 1

## 2024-02-15 MED ORDER — HEPARIN SODIUM (PORCINE) 1000 UNIT/ML DIALYSIS: 1000.0000 [IU] | INTRAMUSCULAR | Status: DC | PRN

## 2024-02-15 NOTE — Progress Notes (Signed)
° °  Brief Progress Note   _____________________________________________________________________________________________________________  Patient Name: Deborah [name] Patient DOB: 06-Aug-1965 Date: @TODAY @      Data: Reviewed vital signs, labs, and notes.    Action: No action required at this time.     Response:  pt dx with AMS. Hx of ESRD on HD.  _____________________________________________________________________________________________________________  [facility] RN Expeditor Kim Oki S Aylah Yeary Please contact us  directly via secure chat (search for [facility]) or by calling us  at [phone] [facility]).

## 2024-02-15 NOTE — Progress Notes (Signed)
°  Received patient in bed to unit.   Informed consent signed and in chart.    TX duration:3.5     Transported by  Hand-off given to patient's nurse.    Access used: Left HD cath Access issues: None Dressing changed   Total UF removed: 1000 Medication(s) given: Midodrine  po:  Post HD VS: 223/84 :      Hunter Hacking  LPN [facility]

## 2024-02-15 NOTE — Progress Notes (Signed)
 [facility] night cross cover note:   I was notified by the patient's RN that the patient is refusing existing diet order for renal/carb modified diet, and is instead requesting regular diet.  Per patient request, I subsequently modified patient's diet order to reflect regular diet.     Eva Pore, DO Hospitalist

## 2024-02-15 NOTE — Progress Notes (Signed)
 Pharmacy Antibiotic Note  Deborah [name] is a 58 y.o. female for which pharmacy has been consulted for cefepime  and vancomycin  dosing for sepsis.  Patient with a history of AF, DM, T2DM, HLD, ESRD on HD. Patient presenting with AMS.  Flagyl  x 1 in ED 12/9 Bcx -- NGTD  Plan: Cefepime  2g given x 1 in ED --Resume with 1g q24h (give after HD) Vancomycin  t dosing per HD schedule --12/10 3.5 HR HD completed >> 1g vancomycin  now Monitor WBC, fever, renal function, cultures De-escalate when able F/u Nephrology plan     Temp (24hrs), Avg:98 F (36.7 C), Min:97.7 F (36.5 C), Max:98.4 F (36.9 C)  Recent Labs  Lab 02/14/24 1402 02/14/24 1413 02/15/24 0818 02/15/24 0836  WBC 39.9*  --   --  35.0*  CREATININE 6.36* 6.10*  --  6.24*  LATICACIDVEN  --  2.1* 0.6  --     CrCl cannot be calculated (Unknown ideal weight.).    Allergies  Allergen Reactions   Gabapentin  Hives and [name]   Isovue  [Iopamidol ] Anaphylaxis, [name] and Other (See Comments)    11/28/17 Patient had seizure like activity and then 1 min code after 100 cc of isovue  300. Possible contrast allergy vs vasovagal episode  Cardiac Arrest   Nsaids Anaphylaxis and Other (See Comments)    Hx of stomach ulcers   Penicillins Itching, Palpitations and Other (See Comments)    Flushing (Red Skin) Laryngeal Edema   Reglan  [Metoclopramide ] Other (See Comments)    Tardive dyskinesia    Valium  [Diazepam ] [name]   Zestril [Lisinopril] Anaphylaxis and Swelling    Tongue and mouth swelling Laryngeal Edema   Tolectin [Tolmetin] Nausea And Vomiting, Nausea Only and Other (See Comments)    Irritates stomach ulcer   Asa [Aspirin ] Other (See Comments)    Hx of stomach ulcer   Aspartame And Phenylalanine Hives   Bentyl  [Dicyclomine ] Other (See Comments)    Chest pain   Hibiclens  [Chlorhexidine  Gluconate] Other (See Comments)    Dermatitis    Flexeril  [Cyclobenzaprine ] Palpitations    Oxycontin  [Oxycodone ] Palpitations   Rifamycins Palpitations   Tylenol  [Acetaminophen ] Nausea And Vomiting, Nausea Only and Other (See Comments)    Irritates stomach ulcer Abdominal pain   Ultram  [Tramadol ] Nausea And Vomiting and Palpitations   Microbiology results: Pending  Thank you for allowing pharmacy to be a part of this patients care.  Dorn Buttner, PharmD, BCPS 02/15/2024 3:28 PM ED Clinical Pharmacist -  [phone]

## 2024-02-15 NOTE — Progress Notes (Addendum)
 PROGRESS NOTE   Deborah [name]  FMW:[id number]    DOB: 1965/10/16    DOA: 02/14/2024  PCP: Pcp, No   I have briefly reviewed patients previous medical records in [facility].   Brief Hospital Course:  58 year old female with medical history significant for ESRD on HD, paroxysmal A-fib, DM2, chronic pain, HLD, R BKA, presented to the ED via EMS from her dialysis center with altered mental status, lethargy, alert and oriented x 1, slightly hypotensive and tachycardic, reportedly did not get dialysis, had left-sided deficits from prior stroke.  Admitted for acute metabolic encephalopathy of unclear etiology, remained altered 12/10.   Assessment & Plan:   Acute metabolic encephalopathy Etiology unclear.  DD: Uremia versus polypharmacy versus concern for infectious etiology although no clear source. CT head without acute findings.  Chest x-ray negative. Blood cultures additive to date.  Flu/RSV/COVID testing negative. Requested urine microscopy (reports that she still makes urine), UDS. Delirium precautions.  Avoid sedative or opioids.  Will also need to check with family regarding her baseline mental status. For now continue empirically started IV cefepime , metronidazole  and vancomycin  since patient has significant leukocytosis Patient in dialysis now.  Patient was seen this morning prior to HD.  SIRS Leukocytosis Since hospital admission, afebrile, intermittent mild tachypnea in the 20s, sustained tachycardia in the 100s, intermittent hypotension with SBP at times in the 80s and 90s.  GCS 13-14. See discussion pertaining to infection above. Follow daily CBCs Will check left lower extremity venous Doppler to rule out DVT.  Index of suspicion is low.   ESRD on hemodialysis Patient presented from her hemodialysis center without getting HD on day of admission. Nephrology consulted and patient in dialysis now.   Diabetes mellitus type 2 Well-controlled based on prior hemoglobin  A1c from 2024 of 5.7%. - SSI every 4 hours while patient is altered/NPO.  Controlled.  Resume diet when able/safe to tolerate p.o.   Chronic hypotension Complicated by hemodialysis. Patient is on midodrine  before hemodialysis as an outpatient.  Currently blood pressure is low-normal/stable.   Hyperlipidemia Noted.  Patient is listed as taking Lipitor as an outpatient, however last dispensed in October.   History of paroxysmal atrial fibrillation Noted.  Patient does not have anticoagulation or rate control medication list on home medication list.  Patient is currently in sinus rhythm.   Gout Patient listed as taking allopurinol  as an outpatient.  Last dispensed in October.  Unclear if she is currently taking at this time.   Depression Anxiety Patient is listed as taking BuSpar , risperidone , trazodone , Remeron .  Holding most of these medications at this time until further improvement and reassessment of mental status.   History of nausea vomiting Patient without current symptoms.  Anemia of ESRD Fairly stable.  Follow CBCs.  There is no height or weight on file to calculate BMI.   DVT prophylaxis: heparin  injection 5,000 Units Start: 02/14/24 2315     Code Status: Full Code:  Family Communication: None at bedside Disposition:  Status is: Inpatient Remains inpatient appropriate because: Ongoing AMS.     Consultants:   Nephrology  Procedures:   Hemodialysis  Subjective:  Seen this morning while she was still in ED and heading to hemodialysis.  Discussed with ED RN.  Remains altered.  Oriented to self only, states that she is at a nursing home.  Unable to tell time.  No other complaints noted.  Objective:   Vitals:   02/15/24 0818 02/15/24 0830 02/15/24 0900 02/15/24 0930  BP:  108/75 (!) 113/48 105/62 99/61  Pulse: (!) 108 (!) 103 (!) 109 (!) 110  Resp: 12 13 14 17   Temp:      TempSrc:      SpO2: 100% 100% 100% 100%    General exam: Young female, moderately built  and obese lying comfortably propped up in bed without distress. Respiratory system: Clear to auscultation. Respiratory effort normal. Cardiovascular system: S1 & S2 heard, RRR. No JVD, murmurs, rubs, gallops or clicks. No pedal edema. Gastrointestinal system: Abdomen is nondistended, soft and nontender. No organomegaly or masses felt. Normal bowel sounds heard. Central nervous system: Alert and oriented only to self. No focal neurological deficits. Extremities: Symmetric 5 x 5 power.  Left lower extremity with chronic hyperpigmentation, edema 1-2+, some warmth and tender to touch.  Right BKA healed stump. Skin: No rashes, lesions or ulcers Psychiatry: Judgement and insight impaired. Mood & affect cannot be assessed.     Data Reviewed:   I have personally reviewed following labs and imaging studies   CBC: Recent Labs  Lab 02/14/24 1402 02/14/24 1413 02/15/24 0836  WBC 39.9*  --  35.0*  NEUTROABS  --   --  29.8*  HGB 9.8* 12.2 8.1*  HCT 31.4* 36.0 25.3*  MCV 92.9  --  92.7  PLT 212  --  183    Basic Metabolic Panel: Recent Labs  Lab 02/14/24 1402 02/14/24 1413 02/15/24 0836  NA 135 134* 134*  K 4.1 4.1 3.8  CL 96* 97* 99  CO2 27  --  21*  GLUCOSE 171* 168* 109*  BUN 21* 25* 24*  CREATININE 6.36* 6.10* 6.24*  CALCIUM  8.8*  --  8.3*  MG 1.8  --   --     Liver Function Tests: Recent Labs  Lab 02/14/24 1402 02/15/24 0836  AST 13* 10*  ALT 10 8  ALKPHOS 109 87  BILITOT 0.9 0.9  PROT 6.3* 4.8*  ALBUMIN  1.7* <1.5*    CBG: Recent Labs  Lab 02/14/24 1403 02/14/24 2315 02/15/24 0358  GLUCAP 164* 122* 105*    Microbiology Studies:   Recent Results (from the past 240 hours)  Culture, blood (single)     Status: None (Preliminary result)   Collection Time: 02/14/24  1:10 PM   Specimen: BLOOD RIGHT FOREARM  Result Value Ref Range Status   Specimen Description BLOOD RIGHT FOREARM  Final   Special Requests   Final    BOTTLES DRAWN AEROBIC AND ANAEROBIC Blood  Culture results may not be optimal due to an inadequate volume of blood received in culture bottles   Culture   Final    NO GROWTH < 24 HOURS Performed at [facility], [address] [place], [place] [address]    Report Status PENDING  Incomplete  Resp panel by RT-PCR (RSV, Flu A&B, Covid) Anterior Nasal Swab     Status: None   Collection Time: 02/14/24  3:23 PM   Specimen: Anterior Nasal Swab  Result Value Ref Range Status   SARS Coronavirus 2 by RT PCR NEGATIVE NEGATIVE Final   Influenza A by PCR NEGATIVE NEGATIVE Final   Influenza B by PCR NEGATIVE NEGATIVE Final    Comment: (NOTE) The Xpert Xpress SARS-CoV-2/FLU/RSV plus assay is intended as an aid in the diagnosis of influenza from Nasopharyngeal swab specimens and should not be used as a sole basis for treatment. Nasal washings and aspirates are unacceptable for Xpert Xpress SARS-CoV-2/FLU/RSV testing.  Fact Sheet for Patients: bloggercourse.com  Fact Sheet  for Healthcare Providers: seriousbroker.it  This test is not yet approved or cleared by the [place]  FDA and has been authorized for detection and/or diagnosis of SARS-CoV-2 by FDA under an Emergency Use Authorization (EUA). This EUA will remain in effect (meaning this test can be used) for the duration of the COVID-19 declaration under Section 564(b)(1) of the Act, 21 U.S.C. section 360bbb-3(b)(1), unless the authorization is terminated or revoked.     Resp Syncytial Virus by PCR NEGATIVE NEGATIVE Final    Comment: (NOTE) Fact Sheet for Patients: bloggercourse.com  Fact Sheet for Healthcare Providers: seriousbroker.it  This test is not yet approved or cleared by the [place]  FDA and has been authorized for detection and/or diagnosis of SARS-CoV-2 by FDA under an Emergency Use Authorization (EUA). This EUA will remain in effect (meaning this test  can be used) for the duration of the COVID-19 declaration under Section 564(b)(1) of the Act, 21 U.S.C. section 360bbb-3(b)(1), unless the authorization is terminated or revoked.  Performed at [facility], [address], [place], [place] [address]     Radiology Studies:  DG Chest Port 1 View Result Date: 02/14/2024 EXAM: 1 VIEW(S) XRAY OF THE CHEST 02/14/2024 02:14:22 PM COMPARISON: 05/29/2023 CLINICAL HISTORY: AMS FINDINGS: LINES, TUBES AND DEVICES: Stable tunneled right chest dialysis catheter in place. LUNGS AND PLEURA: Low lung volumes. No focal pulmonary opacity. No pleural effusion. No pneumothorax. HEART AND MEDIASTINUM: No acute abnormality of the cardiac and mediastinal silhouettes. BONES AND SOFT TISSUES: Bilateral shoulder DJD. No acute osseous abnormality. IMPRESSION: 1. No acute findings. 2. Stable tunneled right chest dialysis catheter in place. Electronically signed by: Franky Stanford MD 02/14/2024 03:09 PM EST RP Workstation: [id number]   CT Head Wo Contrast Result Date: 02/14/2024 EXAM: CT HEAD WITHOUT 02/14/2024 02:53:00 PM TECHNIQUE: CT of the head was performed without the administration of intravenous contrast. Automated exposure control, iterative reconstruction, and/or weight based adjustment of the mA/kV was utilized to reduce the radiation dose to as low as reasonably achievable. COMPARISON: 01/01/2023 CLINICAL HISTORY: Mental status change, unknown cause FINDINGS: BRAIN AND VENTRICLES: No acute intracranial hemorrhage. No mass effect or midline shift. No extra-axial fluid collection. No evidence of acute infarct. No hydrocephalus. Atherosclerotic calcifications within cavernous internal carotid arteries. ORBITS: Bilateral lens replacement. No acute abnormality. SINUSES AND MASTOIDS: No acute abnormality. SOFT TISSUES AND SKULL: No acute skull fracture. No acute soft tissue abnormality. IMPRESSION: 1. No acute intracranial abnormality. Electronically signed by: Franky Stanford MD 02/14/2024 03:09 PM EST RP Workstation: [id number]    Scheduled Meds:    heparin   5,000 Units Subcutaneous Q8H   insulin  aspart  0-6 Units Subcutaneous Q4H    Continuous Infusions:    ceFEPime  (MAXIPIME ) IV     lactated ringers  150 mL/hr at 02/15/24 0431   metronidazole  500 mg (02/15/24 0627)     LOS: 1 day     Deborah Mar, MD,  FACP, [facility], [facility], [facility]   [facility] Advisor [place]      To contact the attending provider between 7A-7P or the covering provider during after hours 7P-7A, please log into the web site [email or web address] and access using universal [place] password for that web site. If you do not have the password, please call the hospital operator.  02/15/2024, 9:36 AM

## 2024-02-15 NOTE — ED Notes (Signed)
 This RN is assuming care of this pt returning from dialysis. Pt denies any chest pain, dizziness, or shob at this time. Pt in no acute distress.

## 2024-02-15 NOTE — Procedures (Signed)
 S: pt seen in HD unit. BP's remain soft but stable. Pt alert and responsive.   Vitals:   02/15/24 1030 02/15/24 1100 02/15/24 1157 02/15/24 1159  BP:   111/76 112/84  Pulse: (!) 112 (!) 109 (!) 107 (!) 108  Resp: 14 14 11 15   Temp:   97.7 F (36.5 C) 97.7 F (36.5 C)  TempSrc:      SpO2: 100% 100% 100% 100%    Recent Labs  Lab 02/14/24 1402 02/14/24 1413 02/15/24 0836  HGB 9.8* 12.2 8.1*  ALBUMIN  1.7*  --  <1.5*  CALCIUM  8.8*  --  8.3*  CREATININE 6.36* 6.10* 6.24*  K 4.1 4.1 3.8    Inpatient medications:  (feeding supplement) PROSource Plus  30 mL Oral BID BM   allopurinol   100 mg Oral BID   atorvastatin   10 mg Oral QPM   cholestyramine  light  4 g Oral BID   [START ON 02/16/2024] famotidine   20 mg Oral QAC breakfast   ferric citrate   420 mg Oral TID WC   folic acid   1 mg Oral Daily   heparin   5,000 Units Subcutaneous Q8H   hyoscyamine   0.125 mg Sublingual TID   insulin  aspart  0-6 Units Subcutaneous Q4H   midodrine   10 mg Oral TID   pantoprazole   40 mg Oral Daily    anticoagulant sodium citrate      ceFEPime  (MAXIPIME ) IV     metronidazole  500 mg (02/15/24 0627)   albuterol , alteplase , anticoagulant sodium citrate , feeding supplement (NEPRO CARB STEADY), heparin , lidocaine  (PF), lidocaine -prilocaine , ondansetron , pentafluoroprop-tetrafluoroeth, polyethylene glycol  I was present at the procedure, reviewed the HD regimen and made appropriate changes.   Myer Fret MD  [facility] 02/15/2024, 12:09 PM

## 2024-02-15 NOTE — ED Notes (Addendum)
 Pt began questioning where she is at and if she is going home. Pt advised that she will be getting admitted. She states that she wants to go home and that she is getting [place] to [place]. Pt reassured she is at [facility] in [place], pt replies that she is not. Refuses to take medications.

## 2024-02-15 NOTE — ED Notes (Signed)
 Patient refused for this phlebotomist to take a look for morning labs. RN is aware

## 2024-02-15 NOTE — Progress Notes (Signed)
 Pt receives out-pt HD at [facility] on TTS 12:10 pm chair time. Will assist as needed.   Randine Mungo Dialysis Navigator [phone]

## 2024-02-16 ENCOUNTER — Inpatient Hospital Stay (HOSPITAL_COMMUNITY): Admitting: Anesthesiology

## 2024-02-16 ENCOUNTER — Inpatient Hospital Stay (HOSPITAL_COMMUNITY)

## 2024-02-16 ENCOUNTER — Encounter (HOSPITAL_COMMUNITY)
Admission: EM | Disposition: A | Discharge status: Skilled Nursing Facility | Payer: Self-pay | Source: Ambulatory Visit | Attending: Internal Medicine

## 2024-02-16 ENCOUNTER — Encounter (HOSPITAL_COMMUNITY): Payer: Self-pay | Admitting: Family Medicine

## 2024-02-16 HISTORY — PX: INCISION AND DRAINAGE ABSCESS: SHX5864

## 2024-02-16 LAB — CBC
HCT: 25.4 % — ABNORMAL LOW (ref 36.0–46.0)
Hemoglobin: 8.3 g/dL — ABNORMAL LOW (ref 12.0–15.0)
MCH: 29 pg (ref 26.0–34.0)
MCHC: 32.7 g/dL (ref 30.0–36.0)
MCV: 88.8 fL (ref 80.0–100.0)
Platelets: 166 K/uL (ref 150–400)
RBC: 2.86 MIL/uL — ABNORMAL LOW (ref 3.87–5.11)
RDW: 15.4 % (ref 11.5–15.5)
WBC: 28.8 K/uL — ABNORMAL HIGH (ref 4.0–10.5)
nRBC: 0 % (ref 0.0–0.2)

## 2024-02-16 LAB — BASIC METABOLIC PANEL WITH GFR
Anion gap: 7 (ref 5–15)
BUN: 14 mg/dL (ref 6–20)
CO2: 27 mmol/L (ref 22–32)
Calcium: 7.6 mg/dL — ABNORMAL LOW (ref 8.9–10.3)
Chloride: 98 mmol/L (ref 98–111)
Creatinine, Ser: 3.71 mg/dL — ABNORMAL HIGH (ref 0.44–1.00)
GFR, Estimated: 13 mL/min — ABNORMAL LOW (ref >60)
Glucose, Bld: 116 mg/dL — ABNORMAL HIGH (ref 70–99)
Potassium: 3.3 mmol/L — ABNORMAL LOW (ref 3.5–5.1)
Sodium: 132 mmol/L — ABNORMAL LOW (ref 135–145)

## 2024-02-16 LAB — HEPATITIS B SURFACE ANTIBODY, QUANTITATIVE: Hep B S AB Quant (Post): 351 m[IU]/mL

## 2024-02-16 LAB — GLUCOSE, CAPILLARY
Glucose-Capillary: 108 mg/dL — ABNORMAL HIGH (ref 70–99)
Glucose-Capillary: 109 mg/dL — ABNORMAL HIGH (ref 70–99)
Glucose-Capillary: 108 mg/dL — ABNORMAL HIGH (ref 70–99)
Glucose-Capillary: 109 mg/dL — ABNORMAL HIGH (ref 70–99)
Glucose-Capillary: 106 mg/dL — ABNORMAL HIGH (ref 70–99)
Glucose-Capillary: 112 mg/dL — ABNORMAL HIGH (ref 70–99)
Glucose-Capillary: 107 mg/dL — ABNORMAL HIGH (ref 70–99)
Glucose-Capillary: 101 mg/dL — ABNORMAL HIGH (ref 70–99)
Glucose-Capillary: 99 mg/dL (ref 70–99)

## 2024-02-16 SURGERY — INCISION AND DRAINAGE, ABSCESS
Anesthesia: General | Site: Thigh | Laterality: Left

## 2024-02-16 MED ORDER — LINEZOLID 600 MG/300ML IV SOLN
600.0000 mg | Freq: Two times a day (BID) | INTRAVENOUS | Status: DC
  Administered 2024-02-17 – 2024-02-20 (×7): 600 mg via INTRAVENOUS
  Filled 2024-02-16 (×10): qty 300

## 2024-02-16 MED ORDER — PROPOFOL 10 MG/ML IV BOLUS
INTRAVENOUS | Status: DC | PRN
  Administered 2024-02-16: 160 mg via INTRAVENOUS
  Administered 2024-02-16: 40 mg via INTRAVENOUS

## 2024-02-16 MED ORDER — FENTANYL CITRATE (PF) 100 MCG/2ML IJ SOLN
INTRAMUSCULAR | Status: AC
  Filled 2024-02-16: qty 2

## 2024-02-16 MED ORDER — ONDANSETRON HCL 4 MG/2ML IJ SOLN
INTRAMUSCULAR | Status: AC
  Filled 2024-02-16: qty 2

## 2024-02-16 MED ORDER — FENTANYL CITRATE (PF) 100 MCG/2ML IJ SOLN: 25.0000 ug | INTRAMUSCULAR | Status: DC | PRN

## 2024-02-16 MED ORDER — ALBUMIN HUMAN 5 % IV SOLN
12.5000 g | Freq: Once | INTRAVENOUS | Status: AC
  Administered 2024-02-16: 12.5 g via INTRAVENOUS

## 2024-02-16 MED ORDER — CHLORHEXIDINE GLUCONATE 0.12 % MT SOLN: 15.0000 mL | Freq: Once | OROMUCOSAL | Status: DC

## 2024-02-16 MED ORDER — DEXMEDETOMIDINE HCL IN NACL 80 MCG/20ML IV SOLN
INTRAVENOUS | Status: DC | PRN
  Administered 2024-02-16: 10 ug via INTRAVENOUS

## 2024-02-16 MED ORDER — HYDROMORPHONE HCL 1 MG/ML IJ SOLN
INTRAMUSCULAR | Status: DC | PRN
  Administered 2024-02-16: .5 mg via INTRAVENOUS

## 2024-02-16 MED ORDER — PROPOFOL 10 MG/ML IV BOLUS
INTRAVENOUS | Status: AC
  Filled 2024-02-16: qty 20

## 2024-02-16 MED ORDER — CHLORHEXIDINE GLUCONATE 0.12 % MT SOLN
OROMUCOSAL | Status: AC
  Administered 2024-02-16: 15 mL via OROMUCOSAL
  Filled 2024-02-16: qty 15

## 2024-02-16 MED ORDER — SODIUM CHLORIDE 0.9 % IV SOLN: INTRAVENOUS | Status: DC

## 2024-02-16 MED ORDER — FENTANYL CITRATE (PF) 250 MCG/5ML IJ SOLN
INTRAMUSCULAR | Status: DC | PRN
  Administered 2024-02-16 (×2): 50 ug via INTRAVENOUS

## 2024-02-16 MED ORDER — HYDROMORPHONE HCL 1 MG/ML IJ SOLN
INTRAMUSCULAR | Status: AC
  Filled 2024-02-16: qty 0.5

## 2024-02-16 MED ORDER — SODIUM CHLORIDE 0.9% FLUSH: 10.0000 mL | INTRAVENOUS | Status: DC | PRN

## 2024-02-16 MED ORDER — ONDANSETRON HCL 4 MG/2ML IJ SOLN
INTRAMUSCULAR | Status: DC | PRN
  Administered 2024-02-16: 4 mg via INTRAVENOUS

## 2024-02-16 MED ORDER — BUPIVACAINE HCL (PF) 0.5 % IJ SOLN
INTRAMUSCULAR | Status: AC
  Filled 2024-02-16: qty 30

## 2024-02-16 MED ORDER — ORAL CARE MOUTH RINSE: 15.0000 mL | Freq: Once | OROMUCOSAL | Status: AC

## 2024-02-16 MED ORDER — IOHEXOL 350 MG/ML SOLN
75.0000 mL | Freq: Once | INTRAVENOUS | Status: AC | PRN
  Administered 2024-02-16: 75 mL via INTRAVENOUS

## 2024-02-16 MED ORDER — SODIUM CHLORIDE 0.9 % IV SOLN
500.0000 mg | INTRAVENOUS | Status: DC
  Filled 2024-02-16: qty 10

## 2024-02-16 MED ORDER — MIDAZOLAM HCL 2 MG/2ML IJ SOLN
INTRAMUSCULAR | Status: AC
  Filled 2024-02-16: qty 2

## 2024-02-16 MED ORDER — ALBUMIN HUMAN 5 % IV SOLN
INTRAVENOUS | Status: AC
  Filled 2024-02-16: qty 250

## 2024-02-16 MED ORDER — LIDOCAINE 2% (20 MG/ML) 5 ML SYRINGE
INTRAMUSCULAR | Status: DC | PRN
  Administered 2024-02-16: 60 mg via INTRAVENOUS

## 2024-02-16 MED ORDER — DROPERIDOL 2.5 MG/ML IJ SOLN: 0.6250 mg | Freq: Once | INTRAMUSCULAR | Status: DC | PRN

## 2024-02-16 MED ORDER — BUPIVACAINE HCL 0.5 % IJ SOLN
INTRAMUSCULAR | Status: DC | PRN
  Administered 2024-02-16: 30 mL

## 2024-02-16 MED ORDER — SODIUM CHLORIDE 0.9 % IV SOLN
500.0000 mg | Freq: Once | INTRAVENOUS | Status: DC
  Filled 2024-02-16: qty 10

## 2024-02-16 MED ORDER — LIDOCAINE 2% (20 MG/ML) 5 ML SYRINGE
INTRAMUSCULAR | Status: AC
  Filled 2024-02-16: qty 5

## 2024-02-16 SURGICAL SUPPLY — 22 items
BAG COUNTER SPONGE SURGICOUNT (BAG) ×1 IMPLANT
BNDG GAUZE DERMACEA FLUFF 4 (GAUZE/BANDAGES/DRESSINGS) IMPLANT
CANISTER SUCTION 3000ML PPV (SUCTIONS) ×1 IMPLANT
COVER SURGICAL LIGHT HANDLE (MISCELLANEOUS) ×1 IMPLANT
DRAPE LAPAROSCOPIC ABDOMINAL (DRAPES) ×1 IMPLANT
ELECTRODE REM PT RTRN 9FT ADLT (ELECTROSURGICAL) ×1 IMPLANT
GAUZE PAD ABD 8X10 STRL (GAUZE/BANDAGES/DRESSINGS) IMPLANT
GAUZE SPONGE 4X4 12PLY STRL (GAUZE/BANDAGES/DRESSINGS) IMPLANT
GLOVE SURG SIGNA 7.5 PF LTX (GLOVE) ×1 IMPLANT
GOWN STRL REUS W/ TWL LRG LVL3 (GOWN DISPOSABLE) ×1 IMPLANT
GOWN STRL REUS W/ TWL XL LVL3 (GOWN DISPOSABLE) ×1 IMPLANT
KIT BASIN OR (CUSTOM PROCEDURE TRAY) ×1 IMPLANT
KIT TURNOVER KIT B (KITS) ×1 IMPLANT
PACK GENERAL/GYN (CUSTOM PROCEDURE TRAY) ×1 IMPLANT
PAD ARMBOARD POSITIONER FOAM (MISCELLANEOUS) ×2 IMPLANT
PENCIL SMOKE EVACUATOR (MISCELLANEOUS) ×1 IMPLANT
SOLN 0.9% NACL POUR BTL 1000ML (IV SOLUTION) ×1 IMPLANT
SUT SILK 2 0 SH (SUTURE) IMPLANT
SWAB COLLECTION DEVICE MRSA (MISCELLANEOUS) IMPLANT
SWAB CULTURE ESWAB REG 1ML (MISCELLANEOUS) IMPLANT
TOWEL GREEN STERILE (TOWEL DISPOSABLE) ×1 IMPLANT
TOWEL GREEN STERILE FF (TOWEL DISPOSABLE) ×1 IMPLANT

## 2024-02-16 NOTE — Progress Notes (Signed)
 [facility] Progress Note   Subjective:  Seen in room. Had dialysis yesterday without issues.  Remains confused today. Doesn't know where she is or how she got here.    Objective Vitals:   02/15/24 2358 02/16/24 0002 02/16/24 0443 02/16/24 0526  BP: (!) 89/46 (!) 144/133 (!) 103/53   Pulse:   (!) 105 (!) 106  Resp:   20   Temp: 97.7 F (36.5 C)  98.1 F (36.7 C)   TempSrc: Oral  Oral   SpO2:    100%     Additional Objective Labs: Basic Metabolic Panel: Recent Labs  Lab 02/14/24 1402 02/14/24 1413 02/15/24 0836 02/16/24 0412  NA 135 134* 134* 132*  K 4.1 4.1 3.8 3.3*  CL 96* 97* 99 98  CO2 27  --  21* 27  GLUCOSE 171* 168* 109* 116*  BUN 21* 25* 24* 14  CREATININE 6.36* 6.10* 6.24* 3.71*  CALCIUM  8.8*  --  8.3* 7.6*   CBC: Recent Labs  Lab 02/14/24 1402 02/14/24 1413 02/15/24 0836 02/16/24 0412  WBC 39.9*  --  35.0* 28.8*  NEUTROABS  --   --  29.8*  --   HGB 9.8* 12.2 8.1* 8.3*  HCT 31.4* 36.0 25.3* 25.4*  MCV 92.9  --  92.7 88.8  PLT 212  --  183 166   Blood Culture    Component Value Date/Time   SDES BLOOD RIGHT HAND 02/16/2024 0414   SPECREQUEST  02/16/2024 0414    BOTTLES DRAWN AEROBIC ONLY Blood Culture results may not be optimal due to an inadequate volume of blood received in culture bottles   CULT  02/16/2024 0414    NO GROWTH < 12 HOURS Performed at [facility], [address], [place], [place] [address]    REPTSTATUS PENDING 02/16/2024 0414     Physical Exam General: Chronically ill appearing, nad on RA HEENT: Mouth breathing, dry mm  Heart: RRR Lungs: Clear, no rales Abdomen: non-tender  Extremities: R BKA Dialysis Access: [facility]   Medications:  ceFEPime  (MAXIPIME ) IV Stopped (02/15/24 2100)   metronidazole  500 mg (02/16/24 0523)    (feeding supplement) PROSource Plus  30 mL Oral BID BM   allopurinol   100 mg Oral BID   atorvastatin   10 mg Oral QPM   cholestyramine  light  4 g Oral BID   famotidine   20 mg  Oral QAC breakfast   ferric citrate   420 mg Oral TID WC   folic acid   1 mg Oral Daily   heparin   5,000 Units Subcutaneous Q8H   hyoscyamine   0.125 mg Sublingual TID   insulin  aspart  0-6 Units Subcutaneous TID WC   midodrine   10 mg Oral TID   pantoprazole   40 mg Oral Daily   vancomycin  variable dose per unstable renal function (pharmacist dosing)   Does not apply See admin instructions    Dialysis Orders:  [facility] 4:00 400BFR EDW 96.1 kg 3K/2.5Ca TDC No bolus heparin   Assessment/Plan: AMS/ possible sepsis: ^^WBC, on IV abx started yesterday. L thigh abscess. Surgery consulted. Plan for I&D.  ESRD: on HD TTS. HD on regular schedule today  Hypotension.  Has chronic low BP's takes midodrine  pre HD three times per week. Continue here.  Volume: bp's soft, no pitting edema. No UF  Anemia. Hgb 8.3.  Mircera 225  given 12/6 at OP clinic.  PAD: hx of R BKA  Deborah Ronnald Acosta PA-C [facility] 02/16/2024,9:33 AM

## 2024-02-16 NOTE — Op Note (Signed)
° °  Deborah [name] 02/16/2024   Pre-op  Diagnosis: LARGE LEFT THIGH ABSCESS     Post-op Diagnosis: SAME  Procedures: INCISION AND DRAINAGE LARGE LEFT THIGH ABSCESS  Surgeon(s): Vernetta Berg, MD  Anesthesia: Choice  Staff:  Circulator: Welford Stevenson NOVAK, RN Scrub Person: Dannielle Grayce SAUNDERS Circulator Assistant: Veda Verla SAILOR, RN  Estimated Blood Loss: Minimal               Specimens: cultures sent  Findings: The patient was found to have a very large left thigh abscess.  There do not appear to be any necrotizing infection.  Cultures of the purulence were sent  Procedure: The patient is brought to the operating identifies correct patient.  She was placed upon the operating table and anesthesia was induced.  Her left proximal thigh was then prepped and draped in usual sterile fashion.  There was a small opening with already draining purulence.  I made a longitudinal incision over this area with a scalpel and entered a very large abscess cavity.  This was tracked to the very medial thigh as well as the very lateral thigh going up toward the hip.  It was superficial in the subcutaneous tissue.  There was always a mild amount of necrotic fat and no evidence of necrotizing infection.  Cultures were obtained of the purulence.  All purulence was irrigated from the wound.  There was 1 small bleeding vessel in the groin which I tied off with a 2-0 silk suture ligature.  I then irrigated the wound extensively with saline.  I achieved hemostasis with cautery.  I anesthetized the open wound and surrounding tissue with Marcaine.  I then packed the wound with 2 large saline soaked Kerlix gauze.  ABDs were then placed over this.  The patient tolerated the procedure well.  All the counts were correct at the end of the procedure.  The patient was then extubated in the operating room and taken in a stable condition to the recovery room.          Berg Vernetta   Date: 02/16/2024  Time:  2:50 PM

## 2024-02-16 NOTE — Anesthesia Procedure Notes (Signed)
 Procedure Name: LMA Insertion Date/Time: 02/16/2024 2:26 PM  Performed by: Marva Lonni PARAS, CRNAPre-anesthesia Checklist: Patient identified, Emergency Drugs available, Suction available and Patient being monitored Patient Re-evaluated:Patient Re-evaluated prior to induction Oxygen Delivery Method: Circle System Utilized Preoxygenation: Pre-oxygenation with 100% oxygen Induction Type: IV induction Ventilation: Mask ventilation without difficulty LMA: LMA inserted LMA Size: 4.0 Number of attempts: 1 Airway Equipment and Method: Bite block Placement Confirmation: positive ETCO2 Tube secured with: Tape Dental Injury: Teeth and Oropharynx as per pre-operative assessment

## 2024-02-16 NOTE — Progress Notes (Signed)
 Lower extremity vascular study was attempted, however patient is off he floor for procedure.  Will re attempt as patient availability and schedule permit.   02/16/2024 1:46 PM Delwyn Scoggin RVT, RDMS

## 2024-02-16 NOTE — Progress Notes (Signed)
 Unable to receive hemodialysis at this time because of surgical procedure today, Ronnald Acosta notified

## 2024-02-16 NOTE — Transfer of Care (Signed)
 Immediate Anesthesia Transfer of Care Note  Patient: Deborah [name]  Procedure(s) Performed: INCISION AND DRAINAGE, ABSCESS (Left: Thigh)  Patient Location: PACU  Anesthesia Type:General  Level of Consciousness: awake and alert   Airway & Oxygen Therapy: Patient Spontanous Breathing  Post-op Assessment: Report given to RN and Post -op Vital signs reviewed and stable  Post vital signs: Reviewed and stable  Last Vitals:  Vitals Value Taken Time  BP 91/57 02/16/24 15:02  Temp    Pulse 115 02/16/24 15:06  Resp 13 02/16/24 15:06  SpO2 100 % 02/16/24 15:06  Vitals shown include unfiled device data.  Last Pain:  Vitals:   02/16/24 1333  TempSrc: Oral  PainSc:          Complications: No notable events documented.

## 2024-02-16 NOTE — Plan of Care (Signed)
°  Problem: Coping: Goal: Ability to adjust to condition or change in health will improve Outcome: Progressing   Problem: Health Behavior/Discharge Planning: Goal: Ability to manage health-related needs will improve Outcome: Progressing   Problem: Metabolic: Goal: Ability to maintain appropriate glucose levels will improve Outcome: Progressing   Problem: Skin Integrity: Goal: Risk for impaired skin integrity will decrease Outcome: Progressing   Problem: Clinical Measurements: Goal: Ability to maintain clinical measurements within normal limits will improve Outcome: Progressing Goal: Will remain free from infection Outcome: Progressing   Problem: Safety: Goal: Ability to remain free from injury will improve Outcome: Progressing

## 2024-02-16 NOTE — Progress Notes (Signed)
 Anesthesia attempted to restart IV, 2 times unable and patient is now refusing for us  to attempt again. Nurse on floor aware.

## 2024-02-16 NOTE — Progress Notes (Signed)
 PROGRESS NOTE   Deborah RIESGO  FMW:[id number]    DOB: 09/19/1965    DOA: 02/14/2024  PCP: Pcp, No   I have briefly reviewed patients previous medical records in [facility].   Brief Hospital Course:  58 year old female with medical history significant for ESRD on HD, paroxysmal A-fib, DM2, chronic pain, HLD, R BKA, presented to the ED via EMS from her dialysis center with altered mental status, lethargy, alert and oriented x 1, slightly hypotensive and tachycardic, reportedly did not get dialysis, had left-sided deficits from prior stroke.  Admitted for acute metabolic encephalopathy of unclear etiology, remained altered 12/10.   Assessment & Plan:   Acute metabolic encephalopathy - Due to sepsis -CT head with no acute findings - Would like to avoid cefepime , will transition to meropenem   Severe sepsis, left thigh/pelvic abscess with possible fasciitis - due to thigh, left groin abscess -The abdomen/pelvis and left thigh area noted. - Continue with broad-spectrum empiric antibiotic coverage.  Will change from vancomycin , cefepime  and Flagyl  to meropenem  and Zyvox - Surgery consulted, will keep n.p.o. likely plan for surgery today.   ESRD on hemodialysis Patient presented from her hemodialysis center without getting HD on day of admission. Nephrology consulted    Diabetes mellitus type 2 Well-controlled based on prior hemoglobin A1c from 2024 of 5.7%. - SSI every 4 hours while patient is altered/NPO.  Controlled.  Resume diet when able/safe to tolerate p.o.   Chronic hypotension Complicated by hemodialysis. Patient is on midodrine  before hemodialysis as an outpatient.  Currently blood pressure is low-normal/stable.   Hyperlipidemia Noted.  Resume Lipitor   History of paroxysmal atrial fibrillation Noted.  Patient does not have anticoagulation or rate control medication list on home medication list.  Patient is currently in sinus rhythm.   Gout Allopurinol     Depression Anxiety Patient is listed as taking BuSpar , risperidone , trazodone , Remeron .  Holding most of these medications at this time until further improvement and reassessment of mental status.   History of nausea vomiting Patient without current symptoms.  Anemia of ESRD Fairly stable.  Follow CBCs.  There is no height or weight on file to calculate BMI.   DVT prophylaxis: heparin  injection 5,000 Units Start: 02/14/24 2315     Code Status: Full Code:  Family Communication: Discussed with mother at bedside Disposition:  Status is: Inpatient Remains inpatient appropriate because: Ongoing AMS.     Consultants:   Nephrology General Surgery  Procedures:   Hemodialysis  Subjective:   Remains altered, reports she is feeling poorly, help bring all over  Objective:   Vitals:   02/15/24 2358 02/16/24 0002 02/16/24 0443 02/16/24 0526  BP: (!) 89/46 (!) 144/133 (!) 103/53   Pulse:   (!) 105 (!) 106  Resp:   20   Temp: 97.7 F (36.5 C)  98.1 F (36.7 C)   TempSrc: Oral  Oral   SpO2:    100%    General exam: Young female, moderately built and obese lying comfortably propped up in bed without distress. Respiratory system: Clear to auscultation. Respiratory effort normal. Cardiovascular system: S1 & S2 heard, RRR. No JVD, murmurs, rubs, gallops or clicks. No pedal edema. Gastrointestinal system: Abdomen is nondistended, soft and nontender. No organomegaly or masses felt. Normal bowel sounds heard. Central nervous system: Alert and oriented only to self. No focal neurological deficits. Extremities: Symmetric 5 x 5 power.  Left lower extremity with chronic hyperpigmentation, edema 1-2+, some warmth and tender to touch.  Right BKA  healed stump. Skin: No rashes, lesions or ulcers Psychiatry: Judgement and insight impaired. Mood & affect cannot be assessed.   Somnolent, but wakes up, confused and altered CTAB RRR, Lower abdomen tenderness to palpation, bowel sounds  present DKA, left groin/upper thigh/groin area tenderness erythema and swelling   Data Reviewed:   I have personally reviewed following labs and imaging studies   CBC: Recent Labs  Lab 02/14/24 1402 02/14/24 1413 02/15/24 0836 02/16/24 0412  WBC 39.9*  --  35.0* 28.8*  NEUTROABS  --   --  29.8*  --   HGB 9.8* 12.2 8.1* 8.3*  HCT 31.4* 36.0 25.3* 25.4*  MCV 92.9  --  92.7 88.8  PLT 212  --  183 166    Basic Metabolic Panel: Recent Labs  Lab 02/14/24 1402 02/14/24 1413 02/15/24 0836 02/16/24 0412  NA 135 134* 134* 132*  K 4.1 4.1 3.8 3.3*  CL 96* 97* 99 98  CO2 27  --  21* 27  GLUCOSE 171* 168* 109* 116*  BUN 21* 25* 24* 14  CREATININE 6.36* 6.10* 6.24* 3.71*  CALCIUM  8.8*  --  8.3* 7.6*  MG 1.8  --   --   --     Liver Function Tests: Recent Labs  Lab 02/14/24 1402 02/15/24 0836  AST 13* 10*  ALT 10 8  ALKPHOS 109 87  BILITOT 0.9 0.9  PROT 6.3* 4.8*  ALBUMIN  1.7* <1.5*    CBG: Recent Labs  Lab 02/15/24 1319 02/15/24 1641 02/15/24 2035  GLUCAP 93 104* 172*    Microbiology Studies:   Recent Results (from the past 240 hours)  Culture, blood (single)     Status: None (Preliminary result)   Collection Time: 02/14/24  1:10 PM   Specimen: BLOOD RIGHT FOREARM  Result Value Ref Range Status   Specimen Description BLOOD RIGHT FOREARM  Final   Special Requests   Final    BOTTLES DRAWN AEROBIC AND ANAEROBIC Blood Culture results may not be optimal due to an inadequate volume of blood received in culture bottles   Culture   Final    NO GROWTH 2 DAYS Performed at [facility], [address], [place], [place] [address]    Report Status PENDING  Incomplete  Resp panel by RT-PCR (RSV, Flu A&B, Covid) Anterior Nasal Swab     Status: None   Collection Time: 02/14/24  3:23 PM   Specimen: Anterior Nasal Swab  Result Value Ref Range Status   SARS Coronavirus 2 by RT PCR NEGATIVE NEGATIVE Final   Influenza A by PCR NEGATIVE NEGATIVE Final   Influenza  B by PCR NEGATIVE NEGATIVE Final    Comment: (NOTE) The Xpert Xpress SARS-CoV-2/FLU/RSV plus assay is intended as an aid in the diagnosis of influenza from Nasopharyngeal swab specimens and should not be used as a sole basis for treatment. Nasal washings and aspirates are unacceptable for Xpert Xpress SARS-CoV-2/FLU/RSV testing.  Fact Sheet for Patients: bloggercourse.com  Fact Sheet for Healthcare Providers: seriousbroker.it  This test is not yet approved or cleared by the [place]  FDA and has been authorized for detection and/or diagnosis of SARS-CoV-2 by FDA under an Emergency Use Authorization (EUA). This EUA will remain in effect (meaning this test can be used) for the duration of the COVID-19 declaration under Section 564(b)(1) of the Act, 21 U.S.C. section 360bbb-3(b)(1), unless the authorization is terminated or revoked.     Resp Syncytial Virus by PCR NEGATIVE NEGATIVE Final    Comment: (NOTE)  Fact Sheet for Patients: bloggercourse.com  Fact Sheet for Healthcare Providers: seriousbroker.it  This test is not yet approved or cleared by the [place]  FDA and has been authorized for detection and/or diagnosis of SARS-CoV-2 by FDA under an Emergency Use Authorization (EUA). This EUA will remain in effect (meaning this test can be used) for the duration of the COVID-19 declaration under Section 564(b)(1) of the Act, 21 U.S.C. section 360bbb-3(b)(1), unless the authorization is terminated or revoked.  Performed at [facility], [address] [place], [place] [address]   Culture, blood (Routine X 2) w Reflex to ID Panel     Status: None (Preliminary result)   Collection Time: 02/16/24  4:12 AM   Specimen: BLOOD RIGHT HAND  Result Value Ref Range Status   Specimen Description BLOOD RIGHT HAND  Final   Special Requests   Final    BOTTLES DRAWN AEROBIC ONLY  Blood Culture results may not be optimal due to an inadequate volume of blood received in culture bottles   Culture   Final    NO GROWTH < 12 HOURS Performed at [facility], [address], [place], [place] [address]    Report Status PENDING  Incomplete  Culture, blood (Routine X 2) w Reflex to ID Panel     Status: None (Preliminary result)   Collection Time: 02/16/24  4:14 AM   Specimen: BLOOD RIGHT HAND  Result Value Ref Range Status   Specimen Description BLOOD RIGHT HAND  Final   Special Requests   Final    BOTTLES DRAWN AEROBIC ONLY Blood Culture results may not be optimal due to an inadequate volume of blood received in culture bottles   Culture   Final    NO GROWTH < 12 HOURS Performed at [facility], [address] [place], [place] [address]    Report Status PENDING  Incomplete    Radiology Studies:  CT ABDOMEN PELVIS W CONTRAST Result Date: 02/16/2024 CLINICAL DATA:  Left groin/hip swelling with erythema. Possible sepsis. EXAM: CT ABDOMEN AND PELVIS WITH CONTRAST TECHNIQUE: Multidetector CT imaging of the abdomen and pelvis was performed using the standard protocol following bolus administration of intravenous contrast. RADIATION DOSE REDUCTION: This exam was performed according to the departmental dose-optimization program which includes automated exposure control, adjustment of the mA and/or kV according to patient size and/or use of iterative reconstruction technique. CONTRAST:  75mL OMNIPAQUE  IOHEXOL  350 MG/ML SOLN COMPARISON:  05/29/2023 FINDINGS: Lower chest: Heart is normal size. Dialysis catheter tip over the right atrium. Visualized lung bases demonstrate mild bibasilar linear density compatible with atelectasis. Hepatobiliary: Previous cholecystectomy. Liver and biliary tree are normal. Pancreas: Normal. Spleen: Normal. Adrenals/Urinary Tract: Adrenal glands are normal. Kidneys are normal in size without hydronephrosis or nephrolithiasis. Ureters and bladder  are normal. Stomach/Bowel: Stomach is unremarkable. Couple small bowel loops over the left abdomen at the upper limits of normal in diameter. Small bowel is otherwise unremarkable. Appendix is normal. Mild diverticulosis of the sigmoid colon. Most of the colon is decompressed. Subtle diffuse wall thickening of the rectum. Vascular/Lymphatic: Minimal calcified plaque over the abdominal aorta which is normal caliber. No adenopathy. Reproductive: No change in 4.4 cm calcified oval mass in the left adnexa likely serosal fibroid. Uterus and adnexal regions are otherwise unchanged. Other: No free peritoneal fluid or focal inflammatory change. No free peritoneal air. Oval fluid collection with significant associated mottled air over the anterior subcutaneous fat of the left proximal thigh measuring 3.3 x  15.5 cm in AP and transverse dimension along its anterior component as this communicates with a medial component extending posteriorly and abutting the skin measuring approximately 2.8 x 10.3 cm in transverse in AP dimension. In keeping with patient's history, this is compatible with soft tissue infection/abscess. Musculoskeletal: Stable grade 1 anterolisthesis of L4 on L5. Moderate spondylosis at the L5-S1 level with disc space narrowing present. IMPRESSION: 1. Oval fluid collection with significant associated mottled air over the anterior subcutaneous fat of the left proximal thigh measuring 3.3 x 15.5 cm in AP and transverse dimension along its anterior component as this communicates with a medial component extending posteriorly and abutting the skin measuring approximately 2.8 x 10.3 cm in transverse in AP dimension. In keeping with patient's history, this is compatible with soft tissue infection/abscess. 2. Subtle diffuse wall thickening of the rectum which may be due to lack of distension versus mild proctitis. 3. Mild sigmoid diverticulosis. 4. Aortic atherosclerosis. Aortic Atherosclerosis (ICD10-I70.0). These  results will be called to the ordering clinician or representative by the Radiologist Assistant, and communication documented in the PACS or [employer]. Electronically Signed   By: Toribio Agreste M.D.   On: 02/16/2024 10:24   CT hip left without contrast Result Date: 02/16/2024 EXAM: CT left Hip, without IV contrast CLINICAL HISTORY: sepsis, left groin and pannus edema TECHNIQUE: Axial images were acquired through the left hip without IV contrast. Reformatted images were reviewed. Dose reduction technique was used including one or more of the following: automated exposure control, adjustment of mA and kV according to patient size, and/or iterative reconstruction. COMPARISON: 05/29/2023 FINDINGS: BONES: No acute fracture or focal osseous lesion. JOINTS: No dislocation. No findings of hip effusion. Moderate degenerative arthropathy of the left hip. SOFT TISSUES: A 26.6 x 2.8 x 6.1 cm subcutaneous abscess is present involving the left anterior upper leg and tracking into the left medial upper thigh region just below the perineum. Multiple loculations of internal gas density observed. Surrounding edema/stranding, cannot completely exclude fasciitis. Drainage and potential surgical management recommended. No significant regional muscular involvement. Left fundal subserosal fibroid extending along the left adnexa. Atheromatous vascular calcifications. Small reactive left inguinal lymph nodes. IMPRESSION: 1. 240 cc subcutaneous abscess of the left anterior upper leg tracking into the left medial upper thigh just below the perineum, with internal gas and surrounding edema/stranding; fasciitis cannot be excluded; no significant regional muscular involvement; drainage and potential surgical management recommended. 2. Moderate degenerative arthropathy of the left hip; no hip effusion. 3. Several additional chronic and incidental findings, including small reactive left inguinal lymph nodes, a left fundal subserosal  fibroid extending along the left adnexa, and atheromatous vascular calcifications, see report body for details. These results will be telephoned to the referring provider or the referring provider's representative by professional radiology assistant [facility]) personnel, with communication documented in the clario dashboard. Electronically signed by: Ryan Salvage MD 02/16/2024 10:02 AM EST RP Workstation: [id number]   DG Chest Port 1 View Result Date: 02/14/2024 EXAM: 1 VIEW(S) XRAY OF THE CHEST 02/14/2024 02:14:22 PM COMPARISON: 05/29/2023 CLINICAL HISTORY: AMS FINDINGS: LINES, TUBES AND DEVICES: Stable tunneled right chest dialysis catheter in place. LUNGS AND PLEURA: Low lung volumes. No focal pulmonary opacity. No pleural effusion. No pneumothorax. HEART AND MEDIASTINUM: No acute abnormality of the cardiac and mediastinal silhouettes. BONES AND SOFT TISSUES: Bilateral shoulder DJD. No acute osseous abnormality. IMPRESSION: 1. No acute findings. 2. Stable tunneled right chest dialysis catheter in place. Electronically signed by: Franky Stanford MD 02/14/2024 03:09  PM EST RP Workstation: [id number]   CT Head Wo Contrast Result Date: 02/14/2024 EXAM: CT HEAD WITHOUT 02/14/2024 02:53:00 PM TECHNIQUE: CT of the head was performed without the administration of intravenous contrast. Automated exposure control, iterative reconstruction, and/or weight based adjustment of the mA/kV was utilized to reduce the radiation dose to as low as reasonably achievable. COMPARISON: 01/01/2023 CLINICAL HISTORY: Mental status change, unknown cause FINDINGS: BRAIN AND VENTRICLES: No acute intracranial hemorrhage. No mass effect or midline shift. No extra-axial fluid collection. No evidence of acute infarct. No hydrocephalus. Atherosclerotic calcifications within cavernous internal carotid arteries. ORBITS: Bilateral lens replacement. No acute abnormality. SINUSES AND MASTOIDS: No acute abnormality. SOFT TISSUES AND SKULL: No acute  skull fracture. No acute soft tissue abnormality. IMPRESSION: 1. No acute intracranial abnormality. Electronically signed by: Franky Stanford MD 02/14/2024 03:09 PM EST RP Workstation: [id number]    Scheduled Meds:    (feeding supplement) PROSource Plus  30 mL Oral BID BM   allopurinol   100 mg Oral BID   atorvastatin   10 mg Oral QPM   cholestyramine  light  4 g Oral BID   famotidine   20 mg Oral QAC breakfast   ferric citrate   420 mg Oral TID WC   folic acid   1 mg Oral Daily   heparin   5,000 Units Subcutaneous Q8H   hyoscyamine   0.125 mg Sublingual TID   insulin  aspart  0-6 Units Subcutaneous TID WC   midodrine   10 mg Oral TID   pantoprazole   40 mg Oral Daily   vancomycin  variable dose per unstable renal function (pharmacist dosing)   Does not apply See admin instructions    Continuous Infusions:    ceFEPime  (MAXIPIME ) IV Stopped (02/15/24 2100)   metronidazole  500 mg (02/16/24 0523)     LOS: 2 days     Brayton Lye, MD,  FACP, [facility], [facility], [facility]   [facility]      To contact the attending provider between 7A-7P or the covering provider during after hours 7P-7A, please log into the web site [email or web address] and access using universal  password for that web site. If you do not have the password, please call the hospital operator.  02/16/2024, 10:31 AM

## 2024-02-16 NOTE — Consult Note (Addendum)
 Eugene Zeiders [facility] Nov 17, 1965  [id number].    Requesting MD: Sherlon, MD Chief Complaint/Reason for Consult: thigh abscess  HPI:  Deborah [name] is a 58 y/o F with PMH afib not on thinners, ESRD on HD, DM2, HLD, R BKA, CVA w/ residual left-sided deficits, chronic hypotension and chronic pain who presented to hospital 12/9 via EMS due to AMS and tachycardia. She was started on broad spectrum antibiotics and admitted for further workup. Today a CT scan revealed a left thigh abscess so general surgery has been asked to evaluate.   Pt has multiple drug allergies including  but not limited to PCN and some narcotics. Denies history of abdominal surgery, denies history of previous abscess requiring I&D Denies tobacco, alcohol, drug use  Pt afebrile, HR lw 100's, BP 87-103 systolic WBC 28.8  Hgb 8.3, plts 166   ROS: Review of Systems  All other systems reviewed and are negative.   Family History  Problem Relation Age of Onset   Diabetes Mother    Diabetes Father    Heart disease Father    Diabetes Sister    Congestive Heart Failure Sister 48   Diabetes Brother     Past Medical History:  Diagnosis Date   Acute back pain with sciatica, left    Acute back pain with sciatica, right    Acute encephalopathy 05/29/2022   Acute osteomyelitis of right calcaneus (HCC) 12/21/2022   Anemia, unspecified    Atrial fibrillation with RVR (HCC)-resolved 09/09/2022   Atypical chest pain 09/10/2021   Cancer (HCC)    Carcinoid tumor of duodenum (HCC)    Chest pain with normal coronary angiography 2019   Chronic a-fib (HCC) 09/09/2022   Chronic pain    Chronic systolic CHF (congestive heart failure) (HCC)    Dehiscence of amputation stump of right lower extremity (HCC) 01/29/2023   Diabetes mellitus    DKA (diabetic ketoacidosis) (HCC)    Drug-seeking behavior    21 hospitalizations and 14 CT a/p in 2 years for N/V and abdominal pain, demanding only IV dilaudid    Elevated  troponin    chronic   Esophageal reflux    Fibromyalgia    Gastric ulcer    Gastroparesis    Gout    HCAP (healthcare-associated pneumonia) 06/19/2022   Hyperlipidemia    Hyperosmolar hyperglycemic state (HHS) (HCC) 05/11/2022   Hyperosmolar non-ketotic state due to type 2 diabetes mellitus (HCC) 05/11/2022   Hypertension    Hypomagnesemia    Lumbosacral stenosis    LVH (left ventricular hypertrophy)    Morbid obesity (HCC)    Nausea & vomiting 09/09/2022   NICM (nonischemic cardiomyopathy) (HCC)    PAF (paroxysmal atrial fibrillation) (HCC)    Sepsis (HCC) 11/23/2022   Stroke (HCC) 02/2011   Symptomatic anemia 12/14/2022   Thrombocytosis    Vitamin B12 deficiency anemia     Past Surgical History:  Procedure Laterality Date   ABDOMINAL AORTOGRAM W/LOWER EXTREMITY N/A 11/29/2022   Procedure: ABDOMINAL AORTOGRAM W/LOWER EXTREMITY;  Surgeon: Pearline Norman RAMAN, MD;  [facility];  Service: Cardiovascular;  Laterality: N/A;   AMPUTATION Right 12/29/2022   Procedure: RIGHT BELOW KNEE AMPUTATION;  Surgeon: Harden Jerona GAILS, MD;  Location: [facility] OR;  Service: Orthopedics;  Laterality: Right;   AV FISTULA PLACEMENT Left 06/30/2022   Procedure: LEFT BRACHIOCEPHALIC ARTERIOVENOUS (AV) FISTULA CREATION;  Surgeon: Gretta Lonni PARAS, MD;  Location: [facility] OR;  Service: Vascular;  Laterality: Left;   BIOPSY  07/27/2019  Procedure: BIOPSY;  Surgeon: Rosalie Kitchens, MD;  Location: [facility];  Service: Endoscopy;;   BIOPSY  07/30/2019   Procedure: BIOPSY;  Surgeon: Elicia Claw, MD;  Location: [facility];  Service: Gastroenterology;;   BIOPSY  04/28/2023   Procedure: BIOPSY;  Surgeon: Federico Rosario BROCKS, MD;  Location: [facility] ENDOSCOPY;  Service: Gastroenterology;;   CATARACT EXTRACTION  01/2014   CHOLECYSTECTOMY     COLONOSCOPY WITH PROPOFOL  N/A 07/30/2019   Procedure: COLONOSCOPY WITH PROPOFOL ;  Surgeon: Elicia Claw, MD;  Location: [facility];  Service: Gastroenterology;   Laterality: N/A;   ESOPHAGOGASTRODUODENOSCOPY N/A 07/27/2019   Procedure: ESOPHAGOGASTRODUODENOSCOPY (EGD);  Surgeon: Rosalie Kitchens, MD;  Location: [facility];  Service: Endoscopy;  Laterality: N/A;   ESOPHAGOGASTRODUODENOSCOPY N/A 07/26/2020   Procedure: ESOPHAGOGASTRODUODENOSCOPY (EGD);  Surgeon: Burnette Fallow, MD;  Location: [facility];  Service: Endoscopy;  Laterality: N/A;   ESOPHAGOGASTRODUODENOSCOPY (EGD) WITH PROPOFOL  N/A 08/02/2019   Procedure: ESOPHAGOGASTRODUODENOSCOPY (EGD) WITH PROPOFOL ;  Surgeon: Elicia Claw, MD;  Location: [facility];  Service: Gastroenterology;  Laterality: N/A;   ESOPHAGOGASTRODUODENOSCOPY (EGD) WITH PROPOFOL  N/A 12/23/2022   Procedure: ESOPHAGOGASTRODUODENOSCOPY (EGD) WITH PROPOFOL ;  Surgeon: San Sandor GAILS, DO;  Location: [facility];  Service: Gastroenterology;  Laterality: N/A;   ESOPHAGOGASTRODUODENOSCOPY (EGD) WITH PROPOFOL  N/A 04/28/2023   Procedure: ESOPHAGOGASTRODUODENOSCOPY (EGD) WITH PROPOFOL ;  Surgeon: Federico Rosario BROCKS, MD;  Location: [facility] ENDOSCOPY;  Service: Gastroenterology;  Laterality: N/A;   FISTULA SUPERFICIALIZATION Left 12/31/2022   Procedure: LEFT ARM FISTULA TRANSPOSITION;  Surgeon: Sheree Penne Bruckner, MD;  Location: [facility] OR;  Service: Vascular;  Laterality: Left;   GIVENS CAPSULE STUDY N/A 12/23/2022   Procedure: GIVENS CAPSULE STUDY;  Surgeon: San Sandor GAILS, DO;  Location: [facility]OSCOPY;  Service: Gastroenterology;  Laterality: N/A;   HEMOSTASIS CLIP PLACEMENT  08/02/2019   Procedure: HEMOSTASIS CLIP PLACEMENT;  Surgeon: Elicia Claw, MD;  Location: [facility];  Service: Gastroenterology;;   HOT HEMOSTASIS N/A 12/23/2022   Procedure: HOT HEMOSTASIS (ARGON PLASMA COAGULATION/BICAP);  Surgeon: San Sandor GAILS, DO;  Location: [facility] ENDOSCOPY;  Service: Gastroenterology;  Laterality: N/A;   IR FLUORO GUIDE CV LINE RIGHT  06/24/2022   IR US  GUIDE VASC ACCESS RIGHT  06/24/2022   POLYPECTOMY  07/30/2019   Procedure:  POLYPECTOMY;  Surgeon: Elicia Claw, MD;  Location: [facility];  Service: Gastroenterology;;   POLYPECTOMY  08/02/2019   Procedure: POLYPECTOMY;  Surgeon: Elicia Claw, MD;  Location: [facility];  Service: Gastroenterology;;   HARLEY DILATION N/A 04/28/2023   Procedure: SAVORY DILATION;  Surgeon: Federico Rosario BROCKS, MD;  Location: [facility] ENDOSCOPY;  Service: Gastroenterology;  Laterality: N/A;   STUMP REVISION Right 02/02/2023   Procedure: REVISION RIGHT BELOW KNEE AMPUTATION;  Surgeon: Harden Jerona GAILS, MD;  Location: [facility] OR;  Service: Orthopedics;  Laterality: Right;    Social History:  reports that she has never smoked. She has never used smokeless tobacco. She reports that she does not drink alcohol and does not use drugs.  Allergies: Allergies[1]  Medications Prior to Admission  Medication Sig Dispense Refill   albuterol  (PROVENTIL ) (2.5 MG/3ML) 0.083% nebulizer solution Take 3 mLs (2.5 mg total) by nebulization every 6 (six) hours as needed for wheezing or shortness of breath. 150 mL 1   allopurinol  (ZYLOPRIM ) 100 MG tablet Take 1 tablet (100 mg total) by mouth 2 (two) times daily. 60 tablet 0   ascorbic acid  (VITAMIN C ) 500 MG tablet Take 500 mg by mouth daily.     atorvastatin  (LIPITOR) 10 MG tablet Take 10 mg by mouth every  evening.     b complex-vitamin c -folic acid  (NEPHRO-VITE) 0.8 MG TABS tablet Take 1 tablet by mouth at bedtime.     busPIRone  (BUSPAR ) 5 MG tablet Take 1 tablet (5 mg total) by mouth 3 (three) times daily. 21 tablet 0   calcium  carbonate (TUMS - DOSED IN MG ELEMENTAL CALCIUM ) 500 MG chewable tablet Chew 1 tablet (200 mg of elemental calcium  total) by mouth 2 (two) times daily as needed for indigestion or heartburn.     cetirizine  (ZYRTEC ) 10 MG tablet Take 10 mg by mouth daily.     cholestyramine  light (PREVALITE ) 4 g packet Take 1 packet (4 g total) by mouth 2 (two) times daily.     diclofenac  Sodium (VOLTAREN ) 1 % GEL Apply 2 g topically 4 (four) times daily.      famotidine  (PEPCID ) 20 MG tablet Take 20 mg by mouth daily before breakfast.     ferric citrate  (AURYXIA ) 1 GM 210 MG(Fe) tablet Take 2 tablets (420 mg total) by mouth 3 (three) times daily with meals.     fluticasone  (FLONASE ) 50 MCG/ACT nasal spray Place 2 sprays into both nostrils daily as needed for allergies or rhinitis. 16 g 2   folic acid  (FOLVITE ) 1 MG tablet Take 1 tablet (1 mg total) by mouth daily.     hydrocortisone  cream 1 % Apply 1 Application topically 2 (two) times daily.     HYDROmorphone  (DILAUDID ) 4 MG tablet Take 1 tablet (4 mg total) by mouth every 6 (six) hours as needed for severe pain (pain score 7-10). 20 tablet 0   hydrOXYzine  (ATARAX ) 25 MG tablet Take 1 tablet (25 mg total) by mouth every 4 (four) hours as needed for anxiety.     hyoscyamine  (LEVSIN  SL) 0.125 MG SL tablet Place 1 tablet (0.125 mg total) under the tongue 3 (three) times daily.     insulin  aspart (NOVOLOG ) 100 UNIT/ML injection Inject 0-6 Units into the skin 3 (three) times daily with meals. (Patient taking differently: Inject 1-6 Units into the skin 3 (three) times daily with meals.)     loperamide  (IMODIUM ) 2 MG capsule Take 1 capsule (2 mg total) by mouth every 6 (six) hours.     methocarbamol  (ROBAXIN ) 500 MG tablet Take 1 tablet (500 mg total) by mouth every 6 (six) hours as needed for muscle spasms.     midodrine  (PROAMATINE ) 10 MG tablet Take 1 tablet (10 mg total) by mouth every Monday, Wednesday, and Friday with hemodialysis. (Patient taking differently: Take 10 mg by mouth 3 (three) times daily.)     mirtazapine  (REMERON ) 15 MG tablet Take 15 mg by mouth at bedtime.     Nutritional Supplements (,FEEDING SUPPLEMENT, PROSOURCE PLUS) liquid Take 30 mLs by mouth 2 (two) times daily between meals.     ondansetron  (ZOFRAN -ODT) 4 MG disintegrating tablet Take 1 tablet (4 mg total) by mouth every 8 (eight) hours as needed for nausea or vomiting. 20 tablet 0   pantoprazole  (PROTONIX ) 40 MG tablet Take 1  tablet (40 mg total) by mouth 2 (two) times daily. (Patient taking differently: Take 40 mg by mouth daily.) 60 tablet 3   promethazine  (PHENERGAN ) 25 MG tablet Take 1 tablet (25 mg total) by mouth 4 (four) times daily -  before meals and at bedtime.     risperiDONE  (RISPERDAL  M-TABS) 1 MG disintegrating tablet Take 1 tablet (1 mg total) by mouth at bedtime. 30 tablet 0   saccharomyces boulardii (FLORASTOR) 250 MG capsule Take 250 mg  by mouth in the morning, at noon, and at bedtime.     traZODone  (DESYREL ) 50 MG tablet Take 1 tablet (50 mg total) by mouth at bedtime.       Physical Exam: Blood pressure (!) 103/53, pulse (!) 106, temperature 98.1 F (36.7 C), temperature source Oral, resp. rate 20, last menstrual period 10/10/2012, SpO2 100%. General: chronically ill appearing black female in NAD HEENT: head -normocephalic, atraumatic; Eyes: PERRLA, no conjunctival injection Neck- Trachea is midline CV- sinus tachycardia Pulm- breathing is non-labored Abd- soft, NT/ND, no masses or organomegaly. GU- L anterior-medial thigh with fluctuance, cellulitis, induration, and warmth. Not draining. Tender. MSK- Hx R BKA, upper extremities symmetrical  Neuro- CN II-XII grossly in tact, no paresthesias. Psych- Alert and Oriented to person and , not to hospital or year, with appropriate affect Skin: warm and dry, no rashes or lesions   Results for orders placed or performed during the hospital encounter of 02/14/24 (from the past 48 hours)  Culture, blood (single)     Status: None (Preliminary result)   Collection Time: 02/14/24  1:10 PM   Specimen: BLOOD RIGHT FOREARM  Result Value Ref Range   Specimen Description BLOOD RIGHT FOREARM    Special Requests      BOTTLES DRAWN AEROBIC AND ANAEROBIC Blood Culture results may not be optimal due to an inadequate volume of blood received in culture bottles   Culture      NO GROWTH 2 DAYS Performed at [facility] Lab, [address], [place],  [place] [address]    Report Status PENDING   Comprehensive metabolic panel     Status: Abnormal   Collection Time: 02/14/24  2:02 PM  Result Value Ref Range   Sodium 135 135 - 145 mmol/L   Potassium 4.1 3.5 - 5.1 mmol/L   Chloride 96 (L) 98 - 111 mmol/L   CO2 27 22 - 32 mmol/L   Glucose, Bld 171 (H) 70 - 99 mg/dL    Comment: Glucose reference range applies only to samples taken after fasting for at least 8 hours.   BUN 21 (H) 6 - 20 mg/dL   Creatinine, Ser 3.63 (H) 0.44 - 1.00 mg/dL   Calcium  8.8 (L) 8.9 - 10.3 mg/dL   Total Protein 6.3 (L) 6.5 - 8.1 g/dL   Albumin  1.7 (L) 3.5 - 5.0 g/dL   AST 13 (L) 15 - 41 U/L   ALT 10 0 - 44 U/L   Alkaline Phosphatase 109 38 - 126 U/L   Total Bilirubin 0.9 0.0 - 1.2 mg/dL   GFR, Estimated 7 (L) >60 mL/min    Comment: (NOTE) Calculated using the CKD-EPI Creatinine Equation (2021)    Anion gap 12 5 - 15    Comment: Performed at [facility] Lab, [address] [place], [place] [address]  CBC     Status: Abnormal   Collection Time: 02/14/24  2:02 PM  Result Value Ref Range   WBC 39.9 (H) 4.0 - 10.5 K/uL   RBC 3.38 (L) 3.87 - 5.11 MIL/uL   Hemoglobin 9.8 (L) 12.0 - 15.0 g/dL   HCT 68.5 (L) 63.9 - 53.9 %   MCV 92.9 80.0 - 100.0 fL   MCH 29.0 26.0 - 34.0 pg   MCHC 31.2 30.0 - 36.0 g/dL   RDW 84.1 (H) 88.4 - 84.4 %   Platelets 212 150 - 400 K/uL   nRBC 0.0 0.0 - 0.2 %    Comment: Performed at [facility]  Lab, [address] [place], [place] [address]  Ethanol     Status: None   Collection Time: 02/14/24  2:02 PM  Result Value Ref Range   Alcohol, Ethyl (B) <15 <15 mg/dL    Comment: (NOTE) For medical purposes only. Performed at [facility] Lab, [address], [place], [place] [address]   Magnesium      Status: None   Collection Time: 02/14/24  2:02 PM  Result Value Ref Range   Magnesium  1.8 1.7 - 2.4 mg/dL    Comment: Performed at [facility] Lab, [address] [place], [place] [address]  CBG monitoring, ED     Status:  Abnormal   Collection Time: 02/14/24  2:03 PM  Result Value Ref Range   Glucose-Capillary 164 (H) 70 - 99 mg/dL    Comment: Glucose reference range applies only to samples taken after fasting for at least 8 hours.  I-stat chem 8, ED     Status: Abnormal   Collection Time: 02/14/24  2:13 PM  Result Value Ref Range   Sodium 134 (L) 135 - 145 mmol/L   Potassium 4.1 3.5 - 5.1 mmol/L   Chloride 97 (L) 98 - 111 mmol/L   BUN 25 (H) 6 - 20 mg/dL   Creatinine, Ser 3.89 (H) 0.44 - 1.00 mg/dL   Glucose, Bld 831 (H) 70 - 99 mg/dL    Comment: Glucose reference range applies only to samples taken after fasting for at least 8 hours.   Calcium , Ion 1.18 1.15 - 1.40 mmol/L   TCO2 26 22 - 32 mmol/L   Hemoglobin 12.2 12.0 - 15.0 g/dL   HCT 63.9 63.9 - 53.9 %  I-Stat Lactic Acid     Status: Abnormal   Collection Time: 02/14/24  2:13 PM  Result Value Ref Range   Lactic Acid, Venous 2.1 (HH) 0.5 - 1.9 mmol/L   Comment NOTIFIED PHYSICIAN   Resp panel by RT-PCR (RSV, Flu A&B, Covid) Anterior Nasal Swab     Status: None   Collection Time: 02/14/24  3:23 PM   Specimen: Anterior Nasal Swab  Result Value Ref Range   SARS Coronavirus 2 by RT PCR NEGATIVE NEGATIVE   Influenza A by PCR NEGATIVE NEGATIVE   Influenza B by PCR NEGATIVE NEGATIVE    Comment: (NOTE) The Xpert Xpress SARS-CoV-2/FLU/RSV plus assay is intended as an aid in the diagnosis of influenza from Nasopharyngeal swab specimens and should not be used as a sole basis for treatment. Nasal washings and aspirates are unacceptable for Xpert Xpress SARS-CoV-2/FLU/RSV testing.  Fact Sheet for Patients: bloggercourse.com  Fact Sheet for Healthcare Providers: seriousbroker.it  This test is not yet approved or cleared by the [place]  FDA and has been authorized for detection and/or diagnosis of SARS-CoV-2 by FDA under an Emergency Use Authorization (EUA). This EUA will remain in effect  (meaning this test can be used) for the duration of the COVID-19 declaration under Section 564(b)(1) of the Act, 21 U.S.C. section 360bbb-3(b)(1), unless the authorization is terminated or revoked.     Resp Syncytial Virus by PCR NEGATIVE NEGATIVE    Comment: (NOTE) Fact Sheet for Patients: bloggercourse.com  Fact Sheet for Healthcare Providers: seriousbroker.it  This test is not yet approved or cleared by the [place]  FDA and has been authorized for detection and/or diagnosis of SARS-CoV-2 by FDA under an Emergency Use Authorization (EUA). This EUA will remain in effect (meaning this test can be used) for the duration of the COVID-19 declaration  under Section 564(b)(1) of the Act, 21 U.S.C. section 360bbb-3(b)(1), unless the authorization is terminated or revoked.  Performed at [facility] Lab, [address] [place], [place] [address]   CBG monitoring, ED     Status: Abnormal   Collection Time: 02/14/24 11:15 PM  Result Value Ref Range   Glucose-Capillary 122 (H) 70 - 99 mg/dL    Comment: Glucose reference range applies only to samples taken after fasting for at least 8 hours.  CBG monitoring, ED     Status: Abnormal   Collection Time: 02/15/24  3:58 AM  Result Value Ref Range   Glucose-Capillary 105 (H) 70 - 99 mg/dL    Comment: Glucose reference range applies only to samples taken after fasting for at least 8 hours.  Hepatitis B surface antibody,quantitative     Status: None   Collection Time: 02/15/24  8:18 AM  Result Value Ref Range   Hep B S AB Quant (Post) 351.0 Immunity>10 mIU/mL    Comment: (NOTE)  Status of Immunity                     Anti-HBs Level  ------------------                     -------------- Inconsistent with Immunity                  0.0 - 10.0 Consistent with Immunity                         >10.0 Performed At: [facility] [address] [place], [place] [id number] Jennette Shorter  MD Ey:[phone]   Hepatitis B surface antigen     Status: None   Collection Time: 02/15/24  8:18 AM  Result Value Ref Range   Hepatitis B Surface Ag NON REACTIVE NON REACTIVE    Comment: Performed at [facility] Lab, [address], [place], [place] [address]  Lactic acid, plasma     Status: None   Collection Time: 02/15/24  8:18 AM  Result Value Ref Range   Lactic Acid, Venous 0.6 0.5 - 1.9 mmol/L    Comment: Performed at [facility] Lab, [address] [place], [place] [address]  Ammonia     Status: None   Collection Time: 02/15/24  8:18 AM  Result Value Ref Range   Ammonia 29 9 - 35 umol/L    Comment: Performed at [facility] Lab, [address] [place], [place] [address]  Comprehensive metabolic panel     Status: Abnormal   Collection Time: 02/15/24  8:36 AM  Result Value Ref Range   Sodium 134 (L) 135 - 145 mmol/L   Potassium 3.8 3.5 - 5.1 mmol/L   Chloride 99 98 - 111 mmol/L   CO2 21 (L) 22 - 32 mmol/L   Glucose, Bld 109 (H) 70 - 99 mg/dL    Comment: Glucose reference range applies only to samples taken after fasting for at least 8 hours.   BUN 24 (H) 6 - 20 mg/dL   Creatinine, Ser 3.75 (H) 0.44 - 1.00 mg/dL   Calcium  8.3 (L) 8.9 - 10.3 mg/dL   Total Protein 4.8 (L) 6.5 - 8.1 g/dL   Albumin  <1.5 (L) 3.5 - 5.0 g/dL   AST 10 (L) 15 - 41 U/L   ALT 8 0 - 44 U/L   Alkaline Phosphatase 87 38 - 126 U/L   Total Bilirubin  0.9 0.0 - 1.2 mg/dL   GFR, Estimated 7 (L) >60 mL/min    Comment: (NOTE) Calculated using the CKD-EPI Creatinine Equation (2021)    Anion gap 14 5 - 15    Comment: Performed at [facility], [address], [place], [place] [address]  CBC with Differential/Platelet     Status: Abnormal   Collection Time: 02/15/24  8:36 AM  Result Value Ref Range   WBC 35.0 (H) 4.0 - 10.5 K/uL   RBC 2.73 (L) 3.87 - 5.11 MIL/uL   Hemoglobin 8.1 (L) 12.0 - 15.0 g/dL   HCT 74.6 (L) 63.9 - 53.9 %   MCV 92.7 80.0 - 100.0 fL   MCH 29.7 26.0 - 34.0 pg   MCHC  32.0 30.0 - 36.0 g/dL   RDW 84.1 (H) 88.4 - 84.4 %   Platelets 183 150 - 400 K/uL   nRBC 0.0 0.0 - 0.2 %   Neutrophils Relative % 85 %   Neutro Abs 29.8 (H) 1.7 - 7.7 K/uL   Lymphocytes Relative 4 %   Lymphs Abs 1.5 0.7 - 4.0 K/uL   Monocytes Relative 6 %   Monocytes Absolute 2.0 (H) 0.1 - 1.0 K/uL   Eosinophils Relative 0 %   Eosinophils Absolute 0.2 0.0 - 0.5 K/uL   Basophils Relative 1 %   Basophils Absolute 0.2 (H) 0.0 - 0.1 K/uL   WBC Morphology MORPHOLOGY UNREMARKABLE    RBC Morphology MORPHOLOGY UNREMARKABLE    Smear Review Normal platelet morphology    Immature Granulocytes 4 %   Abs Immature Granulocytes 1.39 (H) 0.00 - 0.07 K/uL    Comment: Performed at [facility], [address] [place], [place] [address]  Technologist smear review     Status: None   Collection Time: 02/15/24 11:17 AM  Result Value Ref Range   WBC MORPHOLOGY Mild Left Shift (1-5% metas, occ myelo)    RBC MORPHOLOGY MORPHOLOGY UNREMARKABLE    Plt Morphology Normal platelet morphology    Clinical Information Leukocytosis     Comment: Performed at [facility], [address], [place], [place] [address]  Hemoglobin A1c     Status: Abnormal   Collection Time: 02/15/24 11:17 AM  Result Value Ref Range   Hgb A1c MFr Bld 4.3 (L) 4.8 - 5.6 %    Comment: (NOTE) Diagnosis of Diabetes The following HbA1c ranges recommended by the American Diabetes Association (ADA) may be used as an aid in the diagnosis of diabetes mellitus.  Hemoglobin             Suggested A1C NGSP%              Diagnosis  <5.7                   Non Diabetic  5.7-6.4                Pre-Diabetic  >6.4                   Diabetic  <7.0                   Glycemic control for                       adults with diabetes.     Mean Plasma Glucose 76.71 mg/dL    Comment: Performed at [facility] Lab, [address], [place], [place] [address]  CBG monitoring,  ED     Status: None   Collection Time: 02/15/24  1:19 PM   Result Value Ref Range   Glucose-Capillary 93 70 - 99 mg/dL    Comment: Glucose reference range applies only to samples taken after fasting for at least 8 hours.  CBG monitoring, ED     Status: Abnormal   Collection Time: 02/15/24  4:41 PM  Result Value Ref Range   Glucose-Capillary 104 (H) 70 - 99 mg/dL    Comment: Glucose reference range applies only to samples taken after fasting for at least 8 hours.  Glucose, capillary     Status: Abnormal   Collection Time: 02/15/24  8:35 PM  Result Value Ref Range   Glucose-Capillary 172 (H) 70 - 99 mg/dL    Comment: Glucose reference range applies only to samples taken after fasting for at least 8 hours.  CBC     Status: Abnormal   Collection Time: 02/16/24  4:12 AM  Result Value Ref Range   WBC 28.8 (H) 4.0 - 10.5 K/uL   RBC 2.86 (L) 3.87 - 5.11 MIL/uL   Hemoglobin 8.3 (L) 12.0 - 15.0 g/dL   HCT 74.5 (L) 63.9 - 53.9 %   MCV 88.8 80.0 - 100.0 fL   MCH 29.0 26.0 - 34.0 pg   MCHC 32.7 30.0 - 36.0 g/dL   RDW 84.5 88.4 - 84.4 %   Platelets 166 150 - 400 K/uL   nRBC 0.0 0.0 - 0.2 %    Comment: Performed at [facility] Lab, [address], [place], [place] [address]  Basic metabolic panel     Status: Abnormal   Collection Time: 02/16/24  4:12 AM  Result Value Ref Range   Sodium 132 (L) 135 - 145 mmol/L   Potassium 3.3 (L) 3.5 - 5.1 mmol/L   Chloride 98 98 - 111 mmol/L   CO2 27 22 - 32 mmol/L   Glucose, Bld 116 (H) 70 - 99 mg/dL    Comment: Glucose reference range applies only to samples taken after fasting for at least 8 hours.   BUN 14 6 - 20 mg/dL   Creatinine, Ser 6.28 (H) 0.44 - 1.00 mg/dL   Calcium  7.6 (L) 8.9 - 10.3 mg/dL   GFR, Estimated 13 (L) >60 mL/min    Comment: (NOTE) Calculated using the CKD-EPI Creatinine Equation (2021)    Anion gap 7 5 - 15    Comment: Performed at [facility] Lab, [address], [place], [place] [address]  Culture, blood (Routine X 2) w Reflex to ID Panel     Status: None (Preliminary result)    Collection Time: 02/16/24  4:12 AM   Specimen: BLOOD RIGHT HAND  Result Value Ref Range   Specimen Description BLOOD RIGHT HAND    Special Requests      BOTTLES DRAWN AEROBIC ONLY Blood Culture results may not be optimal due to an inadequate volume of blood received in culture bottles   Culture      NO GROWTH < 12 HOURS Performed at [facility] Lab, [address], [place], [place] [address]    Report Status PENDING   Culture, blood (Routine X 2) w Reflex to ID Panel     Status: None (Preliminary result)   Collection Time: 02/16/24  4:14 AM   Specimen: BLOOD RIGHT HAND  Result Value Ref Range   Specimen Description BLOOD RIGHT HAND    Special Requests      BOTTLES DRAWN AEROBIC ONLY  Blood Culture results may not be optimal due to an inadequate volume of blood received in culture bottles   Culture      NO GROWTH < 12 HOURS Performed at [facility], [address] [place], [place] [address]    Report Status PENDING    CT ABDOMEN PELVIS W CONTRAST Result Date: 02/16/2024 CLINICAL DATA:  Left groin/hip swelling with erythema. Possible sepsis. EXAM: CT ABDOMEN AND PELVIS WITH CONTRAST TECHNIQUE: Multidetector CT imaging of the abdomen and pelvis was performed using the standard protocol following bolus administration of intravenous contrast. RADIATION DOSE REDUCTION: This exam was performed according to the departmental dose-optimization program which includes automated exposure control, adjustment of the mA and/or kV according to patient size and/or use of iterative reconstruction technique. CONTRAST:  75mL OMNIPAQUE  IOHEXOL  350 MG/ML SOLN COMPARISON:  05/29/2023 FINDINGS: Lower chest: Heart is normal size. Dialysis catheter tip over the right atrium. Visualized lung bases demonstrate mild bibasilar linear density compatible with atelectasis. Hepatobiliary: Previous cholecystectomy. Liver and biliary tree are normal. Pancreas: Normal. Spleen: Normal. Adrenals/Urinary Tract:  Adrenal glands are normal. Kidneys are normal in size without hydronephrosis or nephrolithiasis. Ureters and bladder are normal. Stomach/Bowel: Stomach is unremarkable. Couple small bowel loops over the left abdomen at the upper limits of normal in diameter. Small bowel is otherwise unremarkable. Appendix is normal. Mild diverticulosis of the sigmoid colon. Most of the colon is decompressed. Subtle diffuse wall thickening of the rectum. Vascular/Lymphatic: Minimal calcified plaque over the abdominal aorta which is normal caliber. No adenopathy. Reproductive: No change in 4.4 cm calcified oval mass in the left adnexa likely serosal fibroid. Uterus and adnexal regions are otherwise unchanged. Other: No free peritoneal fluid or focal inflammatory change. No free peritoneal air. Oval fluid collection with significant associated mottled air over the anterior subcutaneous fat of the left proximal thigh measuring 3.3 x 15.5 cm in AP and transverse dimension along its anterior component as this communicates with a medial component extending posteriorly and abutting the skin measuring approximately 2.8 x 10.3 cm in transverse in AP dimension. In keeping with patient's history, this is compatible with soft tissue infection/abscess. Musculoskeletal: Stable grade 1 anterolisthesis of L4 on L5. Moderate spondylosis at the L5-S1 level with disc space narrowing present. IMPRESSION: 1. Oval fluid collection with significant associated mottled air over the anterior subcutaneous fat of the left proximal thigh measuring 3.3 x 15.5 cm in AP and transverse dimension along its anterior component as this communicates with a medial component extending posteriorly and abutting the skin measuring approximately 2.8 x 10.3 cm in transverse in AP dimension. In keeping with patient's history, this is compatible with soft tissue infection/abscess. 2. Subtle diffuse wall thickening of the rectum which may be due to lack of distension versus mild  proctitis. 3. Mild sigmoid diverticulosis. 4. Aortic atherosclerosis. Aortic Atherosclerosis (ICD10-I70.0). These results will be called to the ordering clinician or representative by the Radiologist Assistant, and communication documented in the PACS or [employer]. Electronically Signed   By: Toribio Agreste M.D.   On: 02/16/2024 10:24   CT hip left without contrast Result Date: 02/16/2024 EXAM: CT left Hip, without IV contrast CLINICAL HISTORY: sepsis, left groin and pannus edema TECHNIQUE: Axial images were acquired through the left hip without IV contrast. Reformatted images were reviewed. Dose reduction technique was used including one or more of the following: automated exposure control, adjustment of mA and kV according to patient size, and/or iterative reconstruction. COMPARISON: 05/29/2023 FINDINGS: BONES: No acute  fracture or focal osseous lesion. JOINTS: No dislocation. No findings of hip effusion. Moderate degenerative arthropathy of the left hip. SOFT TISSUES: A 26.6 x 2.8 x 6.1 cm subcutaneous abscess is present involving the left anterior upper leg and tracking into the left medial upper thigh region just below the perineum. Multiple loculations of internal gas density observed. Surrounding edema/stranding, cannot completely exclude fasciitis. Drainage and potential surgical management recommended. No significant regional muscular involvement. Left fundal subserosal fibroid extending along the left adnexa. Atheromatous vascular calcifications. Small reactive left inguinal lymph nodes. IMPRESSION: 1. 240 cc subcutaneous abscess of the left anterior upper leg tracking into the left medial upper thigh just below the perineum, with internal gas and surrounding edema/stranding; fasciitis cannot be excluded; no significant regional muscular involvement; drainage and potential surgical management recommended. 2. Moderate degenerative arthropathy of the left hip; no hip effusion. 3. Several additional  chronic and incidental findings, including small reactive left inguinal lymph nodes, a left fundal subserosal fibroid extending along the left adnexa, and atheromatous vascular calcifications, see report body for details. These results will be telephoned to the referring provider or the referring provider's representative by professional radiology assistant [facility]) personnel, with communication documented in the clario dashboard. Electronically signed by: Ryan Salvage MD 02/16/2024 10:02 AM EST RP Workstation: [id number]   DG Chest Port 1 View Result Date: 02/14/2024 EXAM: 1 VIEW(S) XRAY OF THE CHEST 02/14/2024 02:14:22 PM COMPARISON: 05/29/2023 CLINICAL HISTORY: AMS FINDINGS: LINES, TUBES AND DEVICES: Stable tunneled right chest dialysis catheter in place. LUNGS AND PLEURA: Low lung volumes. No focal pulmonary opacity. No pleural effusion. No pneumothorax. HEART AND MEDIASTINUM: No acute abnormality of the cardiac and mediastinal silhouettes. BONES AND SOFT TISSUES: Bilateral shoulder DJD. No acute osseous abnormality. IMPRESSION: 1. No acute findings. 2. Stable tunneled right chest dialysis catheter in place. Electronically signed by: Franky Stanford MD 02/14/2024 03:09 PM EST RP Workstation: [id number]   CT Head Wo Contrast Result Date: 02/14/2024 EXAM: CT HEAD WITHOUT 02/14/2024 02:53:00 PM TECHNIQUE: CT of the head was performed without the administration of intravenous contrast. Automated exposure control, iterative reconstruction, and/or weight based adjustment of the mA/kV was utilized to reduce the radiation dose to as low as reasonably achievable. COMPARISON: 01/01/2023 CLINICAL HISTORY: Mental status change, unknown cause FINDINGS: BRAIN AND VENTRICLES: No acute intracranial hemorrhage. No mass effect or midline shift. No extra-axial fluid collection. No evidence of acute infarct. No hydrocephalus. Atherosclerotic calcifications within cavernous internal carotid arteries. ORBITS: Bilateral lens  replacement. No acute abnormality. SINUSES AND MASTOIDS: No acute abnormality. SOFT TISSUES AND SKULL: No acute skull fracture. No acute soft tissue abnormality. IMPRESSION: 1. No acute intracranial abnormality. Electronically signed by: Franky Stanford MD 02/14/2024 03:09 PM EST RP Workstation: [id number]      Assessment/Plan Left thigh abscess Afebrile, tachycardic, intermittently hypotensive which is acute on chronic, WBC 28.8 CT w/ large anterior thigh abscess 26 x 6 x 2.8 cm that tracks medically containing gas.  Patient is only oriented to self and [place] , therefore I obtained surgical consent for I&D of abscess from her mother, Ms. Therisa Carol.  The operative and non-operative management of thigh abscess was discussed with the patient. Risks of surgery including bleeding, infection, damage to surrounding structures, drain placement, need for additional procedures/debridement, prolonged hospital stay, as well as the risks of general anesthesia were discussed with the patient and she would like to proceed with surgery. Questions were welcomed and answered. She agrees to blood transfusion if necessary. Pt chronically anemic and currently  hgb is 8.3.  High MDM  I reviewed nursing notes, hospitalist notes, last 24 h vitals and pain scores, last 48 h intake and output, last 24 h labs and trends, and last 24 h imaging results.  Almarie GORMAN Pringle, PA-C [facility] 02/16/2024, 10:32 AM Please see Amion for pager number during day hours 7:00am-4:30pm or 7:00am -11:30am on weekends     [1]  Allergies Allergen Reactions   Gabapentin  Hives and [name]   Isovue  [Iopamidol ] Anaphylaxis, [name] and Other (See Comments)    11/28/17 Patient had seizure like activity and then 1 min code after 100 cc of isovue  300. Possible contrast allergy vs vasovagal episode  Cardiac Arrest   Nsaids Anaphylaxis and Other (See Comments)    Hx of stomach ulcers    Penicillins Itching, Palpitations and Other (See Comments)    Flushing (Red Skin) Laryngeal Edema   Reglan  [Metoclopramide ] Other (See Comments)    Tardive dyskinesia    Valium  [Diazepam ] [name]   Zestril [Lisinopril] Anaphylaxis and Swelling    Tongue and mouth swelling Laryngeal Edema   Tolectin [Tolmetin] Nausea And Vomiting, Nausea Only and Other (See Comments)    Irritates stomach ulcer   Dorethia Foerster ] Other (See Comments)    Hx of stomach ulcer   Aspartame And Phenylalanine Hives   Bentyl  [Dicyclomine ] Other (See Comments)    Chest pain   Hibiclens  [Chlorhexidine  Gluconate] Other (See Comments)    Dermatitis    Flexeril  [Cyclobenzaprine ] Palpitations   Oxycontin  [Oxycodone ] Palpitations   Rifamycins Palpitations   Tylenol  [Acetaminophen ] Nausea And Vomiting, Nausea Only and Other (See Comments)    Irritates stomach ulcer Abdominal pain   Ultram  [Tramadol ] Nausea And Vomiting and Palpitations

## 2024-02-16 NOTE — Anesthesia Preprocedure Evaluation (Addendum)
 Anesthesia Evaluation  Patient identified by MRN, date of birth, ID band Patient awake    Reviewed: Allergy & Precautions, NPO status , Patient's Chart, lab work & pertinent test results  Airway Mallampati: I  TM Distance: >3 FB Neck ROM: Full    Dental  (+) Teeth Intact, Dental Advisory Given   Pulmonary     + decreased breath sounds      Cardiovascular hypertension, + Peripheral Vascular Disease and +CHF  + dysrhythmias (not on anticoagulation) Atrial Fibrillation  Rhythm:Regular Rate:Normal  ECHO 2024: IMPRESSIONS   1. Left ventricular ejection fraction, by estimation, is 55 to 60%. The  left ventricle has normal function. Left ventricular diastolic parameters  are consistent with Grade I diastolic dysfunction (impaired relaxation).   2. Right ventricular systolic function is normal. The right ventricular  size is normal.   3. The mitral valve is normal in structure. No evidence of mitral valve  regurgitation.   4. The aortic valve is tricuspid.   5. The inferior vena cava is normal in size with greater than 50%  respiratory variability, suggesting right atrial pressure of 3 mmHg.      Neuro/Psych   Anxiety Depression    CVA (left weakness), Residual Symptoms    GI/Hepatic PUD,GERD  Medicated,,(+) Hepatitis -  Endo/Other  diabetes    Renal/GU ESRF and DialysisRenal disease     Musculoskeletal  (+)  Fibromyalgia -  Abdominal   Peds  Hematology  (+) Blood dyscrasia, anemia Lab Results      Component                Value               Date                      WBC                      28.8 (H)            02/16/2024                HGB                      8.3 (L)             02/16/2024                HCT                      25.4 (L)            02/16/2024                MCV                      88.8                02/16/2024                PLT                      166                 02/16/2024             Lab  Results      Component                Value  Date                      NA                       132 (L)             02/16/2024                K                        3.3 (L)             02/16/2024                CO2                      27                  02/16/2024                GLUCOSE                  116 (H)             02/16/2024                BUN                      14                  02/16/2024                CREATININE               3.71 (H)            02/16/2024                CALCIUM                   7.6 (L)             02/16/2024                GFR                      60.69               09/27/2013                GFRNONAA                 13 (L)              02/16/2024              Anesthesia Other Findings   Reproductive/Obstetrics                              Anesthesia Physical Anesthesia Plan  ASA: 3  Anesthesia Plan: General   Post-op Pain Management: Tylenol  PO (pre-op )*   Induction: Intravenous  PONV Risk Score and Plan: 3 and Ondansetron , Dexamethasone , Midazolam  and Treatment may vary due to age or medical condition  Airway Management Planned: Mask and LMA  Additional Equipment: None  Intra-op Plan:   Post-operative Plan: Extubation in OR  Informed Consent: I have reviewed the patients History and Physical, chart, labs and discussed the procedure including the risks,  benefits and alternatives for the proposed anesthesia with the patient or authorized representative who has indicated his/her understanding and acceptance.     Dental advisory given  Plan Discussed with: CRNA  Anesthesia Plan Comments:          Anesthesia Quick Evaluation

## 2024-02-17 ENCOUNTER — Encounter (HOSPITAL_COMMUNITY): Payer: Self-pay | Admitting: Surgery

## 2024-02-17 ENCOUNTER — Inpatient Hospital Stay (HOSPITAL_COMMUNITY)

## 2024-02-17 DIAGNOSIS — A419 Sepsis, unspecified organism: Secondary | ICD-10-CM

## 2024-02-17 DIAGNOSIS — R6520 Severe sepsis without septic shock: Secondary | ICD-10-CM

## 2024-02-17 DIAGNOSIS — R7981 Abnormal blood-gas level: Secondary | ICD-10-CM

## 2024-02-17 LAB — GLUCOSE, CAPILLARY
Glucose-Capillary: 100 mg/dL — ABNORMAL HIGH (ref 70–99)
Glucose-Capillary: 103 mg/dL — ABNORMAL HIGH (ref 70–99)
Glucose-Capillary: 113 mg/dL — ABNORMAL HIGH (ref 70–99)
Glucose-Capillary: 84 mg/dL (ref 70–99)
Glucose-Capillary: 84 mg/dL (ref 70–99)
Glucose-Capillary: 96 mg/dL (ref 70–99)
Glucose-Capillary: 97 mg/dL (ref 70–99)

## 2024-02-17 LAB — POCT I-STAT 7, (LYTES, BLD GAS, ICA,H+H)
Acid-Base Excess: 2 mmol/L (ref 0.0–2.0)
Bicarbonate: 25 mmol/L (ref 20.0–28.0)
Calcium, Ion: 0.96 mmol/L — ABNORMAL LOW (ref 1.15–1.40)
HCT: 24 % — ABNORMAL LOW (ref 36.0–46.0)
Hemoglobin: 8.2 g/dL — ABNORMAL LOW (ref 12.0–15.0)
O2 Saturation: 100 %
Potassium: 3.3 mmol/L — ABNORMAL LOW (ref 3.5–5.1)
Sodium: 134 mmol/L — ABNORMAL LOW (ref 135–145)
TCO2: 26 mmol/L (ref 22–32)
pCO2 arterial: 29.6 mmHg — ABNORMAL LOW (ref 32–48)
pH, Arterial: 7.534 — ABNORMAL HIGH (ref 7.35–7.45)
pO2, Arterial: 160 mmHg — ABNORMAL HIGH (ref 83–108)

## 2024-02-17 LAB — RENAL FUNCTION PANEL
Albumin: <1.5 g/dL — ABNORMAL LOW (ref 3.5–5.0)
Anion gap: 9 (ref 5–15)
BUN: 18 mg/dL (ref 6–20)
CO2: 27 mmol/L (ref 22–32)
Calcium: 7.9 mg/dL — ABNORMAL LOW (ref 8.9–10.3)
Chloride: 99 mmol/L (ref 98–111)
Creatinine, Ser: 4.76 mg/dL — ABNORMAL HIGH (ref 0.44–1.00)
GFR, Estimated: 10 mL/min — ABNORMAL LOW (ref >60)
Glucose, Bld: 103 mg/dL — ABNORMAL HIGH (ref 70–99)
Phosphorus: 2.3 mg/dL — ABNORMAL LOW (ref 2.5–4.6)
Potassium: 3.8 mmol/L (ref 3.5–5.1)
Sodium: 135 mmol/L (ref 135–145)

## 2024-02-17 LAB — LACTIC ACID, PLASMA
Lactic Acid, Venous: 0.9 mmol/L (ref 0.5–1.9)
Lactic Acid, Venous: 0.7 mmol/L (ref 0.5–1.9)

## 2024-02-17 LAB — CBC
HCT: 23.5 % — ABNORMAL LOW (ref 36.0–46.0)
Hemoglobin: 7.6 g/dL — ABNORMAL LOW (ref 12.0–15.0)
MCH: 28.9 pg (ref 26.0–34.0)
MCHC: 32.3 g/dL (ref 30.0–36.0)
MCV: 89.4 fL (ref 80.0–100.0)
Platelets: 178 K/uL (ref 150–400)
RBC: 2.63 MIL/uL — ABNORMAL LOW (ref 3.87–5.11)
RDW: 15.6 % — ABNORMAL HIGH (ref 11.5–15.5)
WBC: 31.2 K/uL — ABNORMAL HIGH (ref 4.0–10.5)
nRBC: 0 % (ref 0.0–0.2)

## 2024-02-17 LAB — BLOOD GAS, VENOUS
Acid-Base Excess: 0.5 mmol/L (ref 0.0–2.0)
Acid-Base Excess: 0 mmol/L (ref 0.0–2.0)
Bicarbonate: 25.4 mmol/L (ref 20.0–28.0)
Bicarbonate: 24.8 mmol/L (ref 20.0–28.0)
O2 Saturation: 84.9 %
O2 Saturation: 85.2 %
Patient temperature: 98.2
Patient temperature: 37
pCO2, Ven: >123 mmHg (ref 44–60)
pCO2, Ven: 40 mmHg — ABNORMAL LOW (ref 44–60)
pH, Ven: <6.95 — CL (ref 7.25–7.43)
pH, Ven: 7.4 (ref 7.25–7.43)
pO2, Ven: >507 mmHg — ABNORMAL HIGH (ref 32–45)
pO2, Ven: 50 mmHg — ABNORMAL HIGH (ref 32–45)

## 2024-02-17 LAB — MAGNESIUM: Magnesium: 1.6 mg/dL — ABNORMAL LOW (ref 1.7–2.4)

## 2024-02-17 LAB — PROCALCITONIN: Procalcitonin: 3.22 ng/mL

## 2024-02-17 LAB — AMMONIA: Ammonia: 18 umol/L (ref 9–35)

## 2024-02-17 LAB — PREPARE RBC (CROSSMATCH)

## 2024-02-17 MED ORDER — PENTAFLUOROPROP-TETRAFLUOROETH EX AERO: 1.0000 | INHALATION_SPRAY | CUTANEOUS | Status: DC | PRN

## 2024-02-17 MED ORDER — LIDOCAINE HCL (PF) 1 % IJ SOLN: 5.0000 mL | INTRAMUSCULAR | Status: DC | PRN

## 2024-02-17 MED ORDER — LACTATED RINGERS IV BOLUS
250.0000 mL | Freq: Once | INTRAVENOUS | Status: AC
  Administered 2024-02-17: 250 mL via INTRAVENOUS

## 2024-02-17 MED ORDER — THIAMINE MONONITRATE 100 MG PO TABS
100.0000 mg | ORAL_TABLET | Freq: Every day | ORAL | Status: DC
  Administered 2024-02-17 – 2024-02-18 (×2): 100 mg
  Filled 2024-02-17 (×2): qty 1

## 2024-02-17 MED ORDER — FENTANYL CITRATE (PF) 50 MCG/ML IJ SOSY
25.0000 ug | PREFILLED_SYRINGE | Freq: Once | INTRAMUSCULAR | Status: AC
  Administered 2024-02-17: 25 ug via INTRAVENOUS
  Filled 2024-02-17: qty 1

## 2024-02-17 MED ORDER — PROSOURCE TF20 ENFIT COMPATIBL EN LIQD: 60.0000 mL | Freq: Every day | ENTERAL | Status: DC

## 2024-02-17 MED ORDER — HEPARIN SODIUM (PORCINE) 1000 UNIT/ML DIALYSIS: 1000.0000 [IU] | INTRAMUSCULAR | Status: DC | PRN

## 2024-02-17 MED ORDER — ANTICOAGULANT SODIUM CITRATE 4% (200MG/5ML) IV SOLN: 5.0000 mL | Status: DC | PRN

## 2024-02-17 MED ORDER — SODIUM CHLORIDE 0.9 % IV SOLN
500.0000 mg | Freq: Once | INTRAVENOUS | Status: AC
  Administered 2024-02-17: 500 mg via INTRAVENOUS
  Filled 2024-02-17: qty 10

## 2024-02-17 MED ORDER — HYDROCODONE-ACETAMINOPHEN 5-325 MG PO TABS
1.0000 | ORAL_TABLET | ORAL | Status: DC | PRN
  Administered 2024-02-17 – 2024-02-22 (×6): 1
  Filled 2024-02-17 (×6): qty 1

## 2024-02-17 MED ORDER — OSMOLITE 1.5 CAL PO LIQD
1000.0000 mL | ORAL | Status: AC
  Administered 2024-02-17 – 2024-02-21 (×4): 1000 mL
  Filled 2024-02-17 (×7): qty 1000

## 2024-02-17 MED ORDER — SODIUM CHLORIDE 0.9 % IV SOLN
500.0000 mg | INTRAVENOUS | Status: DC
  Administered 2024-02-18 – 2024-02-19 (×2): 500 mg via INTRAVENOUS
  Filled 2024-02-17 (×3): qty 10

## 2024-02-17 MED ORDER — ALBUMIN HUMAN 25 % IV SOLN
25.0000 g | Freq: Four times a day (QID) | INTRAVENOUS | Status: AC
  Administered 2024-02-17 – 2024-02-18 (×3): 25 g via INTRAVENOUS
  Filled 2024-02-17 (×3): qty 100

## 2024-02-17 MED ORDER — SODIUM CHLORIDE 0.9 % IV BOLUS
250.0000 mL | Freq: Once | INTRAVENOUS | Status: AC | PRN
  Administered 2024-02-17: 250 mL via INTRAVENOUS

## 2024-02-17 MED ORDER — SODIUM CHLORIDE 0.9% IV SOLUTION: Freq: Once | INTRAVENOUS | Status: AC

## 2024-02-17 MED ORDER — ALBUMIN HUMAN 25 % IV SOLN: 25.0000 g | Freq: Four times a day (QID) | INTRAVENOUS | Status: DC

## 2024-02-17 MED ORDER — ALTEPLASE 2 MG IJ SOLR: 2.0000 mg | Freq: Once | INTRAMUSCULAR | Status: DC | PRN

## 2024-02-17 MED ORDER — LIDOCAINE-PRILOCAINE 2.5-2.5 % EX CREA: 1.0000 | TOPICAL_CREAM | CUTANEOUS | Status: DC | PRN

## 2024-02-17 NOTE — Progress Notes (Signed)
 Patient bumped to night shift dialysis, informed patient nurse.    Camellia Brasil, LPN [facility]

## 2024-02-17 NOTE — Plan of Care (Signed)
°  Problem: Respiratory: Goal: Ability to maintain adequate ventilation will improve Outcome: Progressing   Problem: Clinical Measurements: Goal: Respiratory complications will improve Outcome: Progressing   Problem: Fluid Volume: Goal: Hemodynamic stability will improve Outcome: Not Progressing

## 2024-02-17 NOTE — Progress Notes (Addendum)
 PROGRESS NOTE   Deborah [name]  FMW:[id number]    DOB: [birth year]    DOA: 02/14/2024  PCP: Pcp, No   I have briefly reviewed patients previous medical records in [facility].   Brief Hospital Course:   58 year old female with medical history significant for ESRD on HD, paroxysmal A-fib, DM2, chronic pain, HLD, R BKA, presented to the ED via EMS from her dialysis center with altered mental status, lethargy, alert and oriented x 1, slightly hypotensive and tachycardic, reportedly did not get dialysis, had left-sided deficits from prior stroke.  Admitted for acute metabolic encephalopathy of unclear etiology, remained altered 12/10.  Workup significant for severe sepsis, she was found to have significant left thigh abscess, went to the OR 12/11.   Assessment & Plan:   Acute metabolic encephalopathy - Due to sepsis -CT head with no acute findings - Would like to avoid cefepime , so was switched to meropenem  - She remains significantly altered, will recheck ammonia level, obtain EEG and stat MRI brain - Significantly altered, will obtain VBG - Remains altered, unsafe to swallow, will start on TPN, cortrak insertion requested  Severe sepsis, left thigh - due to thigh, left groin abscess -The abdomen/pelvis and left thigh area noted. - Continue with broad-spectrum empiric antibiotic coverage.  Will change from vancomycin , cefepime  and Flagyl  to meropenem  and Zyvox - Surgery consulted, status post I&D 12/11 by Dr. Vernetta  - Follow on intraoperative cultures  -continue with wound care   ESRD  - Renal consulted, for HD today   Diabetes mellitus type 2 Well-controlled based on prior hemoglobin A1c from 2024 of 5.7%. - SSI every 4 hours while patient is altered/NPO.  Controlled.  Resume diet when able/safe to tolerate p.o.   Chronic hypotension Complicated by hemodialysis. Patient is on midodrine  before hemodialysis as an outpatient.   - Hypotensive overnight requiring fluid  bolus. - Continue with IV albumin    Hyperlipidemia Noted.  Resume Lipitor   History of paroxysmal atrial fibrillation Noted.  Patient does not have anticoagulation or rate control medication list on home medication list.  Patient is currently in sinus rhythm.   Gout Allopurinol    Depression Anxiety Patient is listed as taking BuSpar , risperidone , trazodone , Remeron .  Holding most of these medications at this time until further improvement and reassessment of mental status.   History of nausea vomiting Patient without current symptoms.  Obesity class I PCM Hypoalbuminemia - Body mass index is 34.22 kg/m. - Appears to be severely malnourished, will insert cortrak and start tube feed  Anemia of ESRD Hgb low at 7.6 today, given hypotension, will give 1 unit of PRBC  Body mass index is 34.22 kg/m.   DVT prophylaxis: heparin  injection 5,000 Units Start: 02/14/24 2315     Code Status: Full Code:  Family Communication: Discussed with mother at bedside Disposition:  Status is: Inpatient Remains inpatient appropriate because: Ongoing AMS.   The patient is critically ill with multi-organ failure.  Critical care was necessary to treat or prevent imminent or life-threatening deterioration of severe sepsis, acute encephalopathy and was exclusive of separately billable procedures and treating other patients. Total critical care time spent by me: 45 minutes Time spent personally by me on obtaining history from patient or surrogate, evaluation of the patient, evaluation of patient's response to treatment, ordering and review of laboratory studies, ordering and review of radiographic studies, ordering and performing treatments and interventions, and re-evaluation of the patient's condition.  Consultants:   Nephrology General Surgery  Procedures:  Hemodialysis Left thigh abscess I&D by Dr. Vernetta 02/16/2024 Left subclavian TLC placement by [facility] 02/17/2024   Subjective:    Remains altered, rapid response called overnight for low blood pressure, central line placed this morning by PCCM Objective:   Vitals:   02/17/24 0512 02/17/24 0746 02/17/24 0800 02/17/24 1200  BP: (!) 98/51 (!) 99/51 (!) 93/51 124/68  Pulse: 98 95 98 96  Resp: 20 16 17 12   Temp: 98.8 F (37.1 C)  97.7 F (36.5 C) 98.2 F (36.8 C)  TempSrc: Oral  Oral Oral  SpO2: 100% 99% 100% 100%  Weight:      Height:        Remains somnolent this morning, arousable, significantly confused, cannot answer any questions appropriately, chronically ill-appearing Lungs clear to auscultation, on room air  Regular rate and rhythm  Bowel sounds present  Left groin area postop, bandaged, right BKA    Data Reviewed:   I have personally reviewed following labs and imaging studies   CBC: Recent Labs  Lab 02/15/24 0836 02/16/24 0412 02/17/24 0257  WBC 35.0* 28.8* 31.2*  NEUTROABS 29.8*  --   --   HGB 8.1* 8.3* 7.6*  HCT 25.3* 25.4* 23.5*  MCV 92.7 88.8 89.4  PLT 183 166 178    Basic Metabolic Panel: Recent Labs  Lab 02/14/24 1402 02/14/24 1413 02/15/24 0836 02/16/24 0412 02/17/24 0257  NA 135 134* 134* 132* 135  K 4.1 4.1 3.8 3.3* 3.8  CL 96* 97* 99 98 99  CO2 27  --  21* 27 27  GLUCOSE 171* 168* 109* 116* 103*  BUN 21* 25* 24* 14 18  CREATININE 6.36* 6.10* 6.24* 3.71* 4.76*  CALCIUM  8.8*  --  8.3* 7.6* 7.9*  MG 1.8  --   --   --   --   PHOS  --   --   --   --  2.3*    Liver Function Tests: Recent Labs  Lab 02/14/24 1402 02/15/24 0836 02/17/24 0257  AST 13* 10*  --   ALT 10 8  --   ALKPHOS 109 87  --   BILITOT 0.9 0.9  --   PROT 6.3* 4.8*  --   ALBUMIN  1.7* <1.5* <1.5*    CBG: Recent Labs  Lab 02/17/24 0514 02/17/24 0813 02/17/24 1200  GLUCAP 84 84 96    Microbiology Studies:   Recent Results (from the past 240 hours)  Culture, blood (single)     Status: None (Preliminary result)   Collection Time: 02/14/24  1:10 PM   Specimen: BLOOD RIGHT FOREARM   Result Value Ref Range Status   Specimen Description BLOOD RIGHT FOREARM  Final   Special Requests   Final    BOTTLES DRAWN AEROBIC AND ANAEROBIC Blood Culture results may not be optimal due to an inadequate volume of blood received in culture bottles   Culture   Final    NO GROWTH 3 DAYS Performed at [facility], [address] [place], [place] [address]    Report Status PENDING  Incomplete  Resp panel by RT-PCR (RSV, Flu A&B, Covid) Anterior Nasal Swab     Status: None   Collection Time: 02/14/24  3:23 PM   Specimen: Anterior Nasal Swab  Result Value Ref Range Status   SARS Coronavirus 2 by RT PCR NEGATIVE NEGATIVE Final   Influenza A by PCR NEGATIVE NEGATIVE Final   Influenza B by PCR NEGATIVE NEGATIVE Final    Comment: (NOTE) The Xpert Xpress  SARS-CoV-2/FLU/RSV plus assay is intended as an aid in the diagnosis of influenza from Nasopharyngeal swab specimens and should not be used as a sole basis for treatment. Nasal washings and aspirates are unacceptable for Xpert Xpress SARS-CoV-2/FLU/RSV testing.  Fact Sheet for Patients: bloggercourse.com  Fact Sheet for Healthcare Providers: seriousbroker.it  This test is not yet approved or cleared by the [place]  FDA and has been authorized for detection and/or diagnosis of SARS-CoV-2 by FDA under an Emergency Use Authorization (EUA). This EUA will remain in effect (meaning this test can be used) for the duration of the COVID-19 declaration under Section 564(b)(1) of the Act, 21 U.S.C. section 360bbb-3(b)(1), unless the authorization is terminated or revoked.     Resp Syncytial Virus by PCR NEGATIVE NEGATIVE Final    Comment: (NOTE) Fact Sheet for Patients: bloggercourse.com  Fact Sheet for Healthcare Providers: seriousbroker.it  This test is not yet approved or cleared by the [place]  FDA and has been  authorized for detection and/or diagnosis of SARS-CoV-2 by FDA under an Emergency Use Authorization (EUA). This EUA will remain in effect (meaning this test can be used) for the duration of the COVID-19 declaration under Section 564(b)(1) of the Act, 21 U.S.C. section 360bbb-3(b)(1), unless the authorization is terminated or revoked.  Performed at [facility], [address], [place], [place] [address]   Culture, blood (Routine X 2) w Reflex to ID Panel     Status: None (Preliminary result)   Collection Time: 02/16/24  4:12 AM   Specimen: BLOOD RIGHT HAND  Result Value Ref Range Status   Specimen Description BLOOD RIGHT HAND  Final   Special Requests   Final    BOTTLES DRAWN AEROBIC ONLY Blood Culture results may not be optimal due to an inadequate volume of blood received in culture bottles   Culture   Final    NO GROWTH 1 DAY Performed at [facility], [address], [place], [place] [address]    Report Status PENDING  Incomplete  Culture, blood (Routine X 2) w Reflex to ID Panel     Status: None (Preliminary result)   Collection Time: 02/16/24  4:14 AM   Specimen: BLOOD RIGHT HAND  Result Value Ref Range Status   Specimen Description BLOOD RIGHT HAND  Final   Special Requests   Final    BOTTLES DRAWN AEROBIC ONLY Blood Culture results may not be optimal due to an inadequate volume of blood received in culture bottles   Culture   Final    NO GROWTH 1 DAY Performed at [facility], [address], [place], [place] [address]    Report Status PENDING  Incomplete  Aerobic/Anaerobic Culture w Gram Stain (surgical/deep wound)     Status: None (Preliminary result)   Collection Time: 02/16/24  2:30 PM   Specimen: Path fluid; Body Fluid  Result Value Ref Range Status   Specimen Description FLUID  Final   Special Requests LEFT THIGH ABSCESS  Final   Gram Stain   Final    ABUNDANT WBC PRESENT, PREDOMINANTLY PMN RARE GRAM NEGATIVE RODS FEW GRAM POSITIVE RODS RARE  GRAM POSITIVE COCCI    Culture   Final    MODERATE PROTEUS MIRABILIS SUSCEPTIBILITIES TO FOLLOW Performed at [facility], [address] [place], [place] [address]    Report Status PENDING  Incomplete    Radiology Studies:  DG CHEST PORT 1 VIEW Result Date: 02/17/2024 CLINICAL DATA:  Central line placement EXAM:  PORTABLE CHEST 1 VIEW COMPARISON:  Three days ago FINDINGS: The heart size and mediastinal contours are within normal limits. Both lungs are clear. The visualized skeletal structures are unremarkable. Stable right internal jugular dialysis catheter. Interval placement of left internal jugular catheter with distal tip in expected position cavoatrial junction. IMPRESSION: Interval placement of left internal jugular catheter with distal tip in expected position of cavoatrial junction. Electronically Signed   By: Lynwood Landy Raddle M.D.   On: 02/17/2024 08:34   CT ABDOMEN PELVIS W CONTRAST Addendum Date: 02/16/2024 ADDENDUM REPORT: 02/16/2024 10:34 ADDENDUM: These results were called by telephone at the time of interpretation on 02/16/2024 at 10:34 am to provider Dr. SHERLON , who verbally acknowledged these results. Electronically Signed   By: Toribio Agreste M.D.   On: 02/16/2024 10:34   Result Date: 02/16/2024 CLINICAL DATA:  Left groin/hip swelling with erythema. Possible sepsis. EXAM: CT ABDOMEN AND PELVIS WITH CONTRAST TECHNIQUE: Multidetector CT imaging of the abdomen and pelvis was performed using the standard protocol following bolus administration of intravenous contrast. RADIATION DOSE REDUCTION: This exam was performed according to the departmental dose-optimization program which includes automated exposure control, adjustment of the mA and/or kV according to patient size and/or use of iterative reconstruction technique. CONTRAST:  75mL OMNIPAQUE  IOHEXOL  350 MG/ML SOLN COMPARISON:  05/29/2023 FINDINGS: Lower chest: Heart is normal size. Dialysis catheter tip over the right  atrium. Visualized lung bases demonstrate mild bibasilar linear density compatible with atelectasis. Hepatobiliary: Previous cholecystectomy. Liver and biliary tree are normal. Pancreas: Normal. Spleen: Normal. Adrenals/Urinary Tract: Adrenal glands are normal. Kidneys are normal in size without hydronephrosis or nephrolithiasis. Ureters and bladder are normal. Stomach/Bowel: Stomach is unremarkable. Couple small bowel loops over the left abdomen at the upper limits of normal in diameter. Small bowel is otherwise unremarkable. Appendix is normal. Mild diverticulosis of the sigmoid colon. Most of the colon is decompressed. Subtle diffuse wall thickening of the rectum. Vascular/Lymphatic: Minimal calcified plaque over the abdominal aorta which is normal caliber. No adenopathy. Reproductive: No change in 4.4 cm calcified oval mass in the left adnexa likely serosal fibroid. Uterus and adnexal regions are otherwise unchanged. Other: No free peritoneal fluid or focal inflammatory change. No free peritoneal air. Oval fluid collection with significant associated mottled air over the anterior subcutaneous fat of the left proximal thigh measuring 3.3 x 15.5 cm in AP and transverse dimension along its anterior component as this communicates with a medial component extending posteriorly and abutting the skin measuring approximately 2.8 x 10.3 cm in transverse in AP dimension. In keeping with patient's history, this is compatible with soft tissue infection/abscess. Musculoskeletal: Stable grade 1 anterolisthesis of L4 on L5. Moderate spondylosis at the L5-S1 level with disc space narrowing present. IMPRESSION: 1. Oval fluid collection with significant associated mottled air over the anterior subcutaneous fat of the left proximal thigh measuring 3.3 x 15.5 cm in AP and transverse dimension along its anterior component as this communicates with a medial component extending posteriorly and abutting the skin measuring approximately  2.8 x 10.3 cm in transverse in AP dimension. In keeping with patient's history, this is compatible with soft tissue infection/abscess. 2. Subtle diffuse wall thickening of the rectum which may be due to lack of distension versus mild proctitis. 3. Mild sigmoid diverticulosis. 4. Aortic atherosclerosis. Aortic Atherosclerosis (ICD10-I70.0). These results will be called to the ordering clinician or representative by the Radiologist Assistant, and communication documented in the PACS or [employer]. Electronically Signed: By: Toribio Agreste  M.D. On: 02/16/2024 10:24   CT hip left without contrast Result Date: 02/16/2024 EXAM: CT left Hip, without IV contrast CLINICAL HISTORY: sepsis, left groin and pannus edema TECHNIQUE: Axial images were acquired through the left hip without IV contrast. Reformatted images were reviewed. Dose reduction technique was used including one or more of the following: automated exposure control, adjustment of mA and kV according to patient size, and/or iterative reconstruction. COMPARISON: 05/29/2023 FINDINGS: BONES: No acute fracture or focal osseous lesion. JOINTS: No dislocation. No findings of hip effusion. Moderate degenerative arthropathy of the left hip. SOFT TISSUES: A 26.6 x 2.8 x 6.1 cm subcutaneous abscess is present involving the left anterior upper leg and tracking into the left medial upper thigh region just below the perineum. Multiple loculations of internal gas density observed. Surrounding edema/stranding, cannot completely exclude fasciitis. Drainage and potential surgical management recommended. No significant regional muscular involvement. Left fundal subserosal fibroid extending along the left adnexa. Atheromatous vascular calcifications. Small reactive left inguinal lymph nodes. IMPRESSION: 1. 240 cc subcutaneous abscess of the left anterior upper leg tracking into the left medial upper thigh just below the perineum, with internal gas and surrounding  edema/stranding; fasciitis cannot be excluded; no significant regional muscular involvement; drainage and potential surgical management recommended. 2. Moderate degenerative arthropathy of the left hip; no hip effusion. 3. Several additional chronic and incidental findings, including small reactive left inguinal lymph nodes, a left fundal subserosal fibroid extending along the left adnexa, and atheromatous vascular calcifications, see report body for details. These results will be telephoned to the referring provider or the referring provider's representative by professional radiology assistant [facility]) personnel, with communication documented in the clario dashboard. Electronically signed by: Ryan Salvage MD 02/16/2024 10:02 AM EST RP Workstation: [id number]    Scheduled Meds:    (feeding supplement) PROSource Plus  30 mL Oral BID BM   allopurinol   100 mg Oral BID   atorvastatin   10 mg Oral QPM   cholestyramine  light  4 g Oral BID   famotidine   20 mg Oral QAC breakfast   ferric citrate   420 mg Oral TID WC   folic acid   1 mg Oral Daily   heparin   5,000 Units Subcutaneous Q8H   hyoscyamine   0.125 mg Sublingual TID   insulin  aspart  0-6 Units Subcutaneous TID WC   midodrine   10 mg Oral TID   pantoprazole   40 mg Oral Daily    Continuous Infusions:    albumin  human 25 g (02/17/24 0851)   anticoagulant sodium citrate      linezolid (ZYVOX) IV 600 mg (02/17/24 0907)   [START ON 02/18/2024] meropenem  (MERREM ) IV       LOS: 3 days     Brayton Lye, MD,     To contact the attending provider between 7A-7P or the covering provider during after hours 7P-7A, please log into the web site [email or web address] and access using universal [employer] password for that web site. If you do not have the password, please call the hospital operator.  02/17/2024, 12:21 PM

## 2024-02-17 NOTE — Procedures (Signed)
 Cortrak  Tube Type:  Cortrak - 43 inches Tube Location:  Left nare Secured by: Bridle Initial Placement:  Gastric Technique Used to Measure Tube Placement:  Marking at nare/corner of mouth Cortrak Secured At:  73 cm   Cortrak Tube Team Note:  Consult received to place a Cortrak feeding tube.   No x-ray is required. RN may begin using tube.   If the tube becomes dislodged please keep the tube and contact the Cortrak team at [email or web address] for replacement.  If after hours and replacement cannot be delayed, place a NG tube and confirm placement with an abdominal x-ray.    Augustin Shams MS, RD, LDN If unable to be reached, please send secure chat to RD inpatient available from 8:00a-4:00p daily

## 2024-02-17 NOTE — Progress Notes (Addendum)
 Initial Nutrition Assessment  DOCUMENTATION CODES:   Not applicable  INTERVENTION:  Initiate tube feeding via Cortrak: Osmolite 1.5 at 55 ml/h (1320 ml per day) Initiate at 25ml/hr and increase by 10ml/hr q8h until goal rate achieved  Provides 1980 kcal, 83 gm protein, 1006 ml free water daily   Add-on Mg lab draw to morning labs to assess trend  Monitor magnesium , potassium, and phosphorus daily for at least 3 days, MD to replete as needed, as pt is at risk for refeeding syndrome given inability to confirm intake PTA.   Add Thiamine  100 mg daily for 7 days   Consider scheduled bowel regimen if no BM in 1-2 days as no BM since admission (3 days)  Needs new weight to assess trend   NUTRITION DIAGNOSIS:  Inadequate oral intake related to inability to eat as evidenced by NPO status.   GOAL:  Patient will meet greater than or equal to 90% of their needs  MONITOR:  TF tolerance, Diet advancement, Labs  REASON FOR ASSESSMENT:   Consult Enteral/tube feeding initiation and management  ASSESSMENT:   Pt with PMH significant for: ESRD-HD, paroxysmal afib, T2DM, chronic pain, HLD, R BKA, HTN, gastroparesis, and stroke. Presented to ED from HD clinic with AMS, lethargy, hypotensive and tachycardic. Admitted r/t acute metabolic encephalopathy in the setting of severe sepsis 2/2 left thigh/pelvic abscess w/ possible fasciitis. Noted with left sided deficits r/t prior stroke, at baseline.  12/10 admitted 12/11 OR: I&D of L thigh abscess; downgraded to NPO 12/12 Cortrak placed, TF started; central line placed  Rapid called overnight due to low BP. Central line placed by PCCM this morning. Consult placed for Cortak placement due to inability to maintain sufficient PO intake or take medications due to her altered mentation. She is now status post I&D of left thigh abscess x1 day. Will receive HD this evening.  Average Meal Intake No documented meal intake to review  She remains  altered this morning and difficult to rouse. No family at bedside. Unable to collect nutrition-related history. Will assume refeeding risk r/t low PHOS in the presence of kidney dysfunction as well as inability to confirm level of intake PTA. Of note, refeeding can be delayed in HD patients. Needs baseline magnesium . Added to morning labs today. No BM documented in three days since admission. Recommend scheduled bowel regimen if no BM in 1-2 days, as this will increase risk of lab derangements.   Per chart review, appears she requested modification of diet order to regular from renal/carb modified on admission. Subsequently changed and suggests dietary non-adherence at baseline. Notably, PHOS low in spite of this. A1c also well within range at 4.3%. Will recommend standard TF formula at this time.   Admit/Current Weight: 96.2 kg Last HD tx: 12/10 UF achieved: 1000 mL EDW: 96.1 kg  She is unable to report UBW. Per chart review, she has shown 16% weight loss in last six months. This is considered clinically significant for the time frame. Unable to use as prognostic indicator of malnutrition as some of this could be fluid-related in the presence of ESRD and on hemodialysis. Will monitor trend while admitted. Needs new weight. No significant edema on exam. Fluid bolus ordered overnight due to low BPs.   Drains/Lines: Cortrak (gastric) placed 02/17/2024 R subclavian: hemodialysis catheter, double lumen (tunneled) L internal jugular: CVC, triple lumen  Noted, per nephrology, she takes midodrine  to sustain BPs at outpatient HD txs. Hgb low. Receives ESA outpatient. Last received 12/06. 1 unit PRBCs  infused today. Has received IV albumin  as well. Corrected calcium  high normal. Does not appear to be on any calcium -containing medications at baseline. Per home med list, she is ordered calcium  carbonate for indigestion PRN. Also noted to be on Auryxia  phosphorus binder and mirtazapine  at home .Will monitor PHOS  s/p initiation of TF. Given hx of gastroparesis and low PHOS on admission, will recommend standard formula at this time. Will monitor electrolytes and can modify to renal formula, if indicated.   Dialysis Orders: [facility] 4:00 400BFR EDW 96.1 kg 3K/2.5Ca TDC No bolus heparin   Medications: famotidine , ferric citrate , folic acid , SS Novolog , pantoprazole , IV ABX  Labs:  Na+ 135 (wdl) K+ 3.8 (wdl) PHOS 2.3 (L) Corr Ca >0.0  CBGs 103-116 x24 hours A1c 4.3 (02/2024)  NUTRITION - FOCUSED PHYSICAL EXAM: While she does not currently meet criteria for malnutrition, based on muscle and fat depletions observed, if able to collect nutrition hx and recent intake levels as well as assess true body weight trend, she may meet criteria at baseline. Also with notable adipose tissue, that my skew observed depletions. Will continue to monitor and re-assess when able to complete nutrition-focused interview.   Flowsheet Row Most Recent Value  Orbital Region No depletion  Upper Arm Region No depletion  Thoracic and Lumbar Region No depletion  Buccal Region No depletion  Temple Region Mild depletion  Clavicle Bone Region Moderate depletion  Clavicle and Acromion Bone Region Mild depletion  Scapular Bone Region Moderate depletion  Dorsal Hand No depletion  Patellar Region No depletion  Anterior Thigh Region No depletion  Posterior Calf Region Unable to assess  [R BKA and edema to LLE]  Edema (RD Assessment) Mild  Hair Reviewed  Eyes Reviewed  Mouth Reviewed  Skin Reviewed  Nails Reviewed    Diet Order:   Diet Order             Diet NPO time specified  Diet effective now             EDUCATION NEEDS:   Not appropriate for education at this time  Skin:  Skin Assessment: Reviewed RN Assessment  Last BM:  PTA  Height:  Ht Readings from Last 1 Encounters:  02/16/24 5' 6 (1.676 m)   Weight:  Wt Readings from Last 1 Encounters:  02/16/24 96.2 kg    Ideal Body Weight:  55.5 kg -  adjusted for R BKA  BMI:  Body mass index is 34.22 kg/m.  Estimated Nutritional Needs:   Kcal:  1800-2000 kcal  Protein:  85-100g  Fluid:  1L + UOP  Blair Deaner MS, RD, LDN Registered Dietitian Clinical Nutrition RD Inpatient Contact Info in Amion

## 2024-02-17 NOTE — Consult Note (Signed)
 NAME:  Deborah [name], MRN:  [id number], DOB:  1965-10-14, LOS: 3 ADMISSION DATE:  02/14/2024, CONSULTATION DATE:  12/12 REFERRING MD: Elgergawy, CHIEF COMPLAINT: Deranged lab  History of Present Illness:  58 year old female with past medical history of end-stage renal disease on HD, diabetes, hyperlipidemia, PAF who has been admitted for 3 days with altered mental status, lethargy.  She was found to have severe sepsis with a left thigh abscess now s/p I&D on 12/11 with Dr. Vernetta.  Contacted by [facility] today for deranged VBG pH less than 6.95, pCO2 of 123, pO2 of 507, bicarb of 25.4 on room air and he wanted us  to evaluate.    Pertinent  Medical History  end-stage renal disease on HD, diabetes, hyperlipidemia, PAF  Significant Hospital Events: Including procedures, antibiotic start and stop dates in addition to other pertinent events   12/12: Consult  Interim History / Subjective:  Patient is minimally responsive, she does say ouch and pull away to pain.  Objective   Blood pressure 124/68, pulse 96, temperature 98.2 F (36.8 C), temperature source Oral, resp. rate 12, height 5' 6 (1.676 m), weight 96.2 kg, last menstrual period 10/10/2012, SpO2 100%.       No intake or output data in the 24 hours ending 02/17/24 1506 Filed Weights   02/16/24 1333  Weight: 96.2 kg    Examination: General: Middle-age female, acute on chronically ill-appearing HENT: Mucous membranes dry, PERRLA, NCAT Lungs: Clear bilaterally Cardiovascular: S1/S2, or extremities Abdomen: Rounded, soft Extremities: Postop bandage to the left groin Neuro: Lethargic, opens eyes to verbal stimulus, verbally responds to painful stimulus, nonfocal exam GU: N/A  Assessment & Plan:  Acute metabolic encephalopathy Deranged VBG  Called by [facility] after VBG obtained which showed pH less than 6.95, pCO2 of 123, pO2 of 507 on room air.  Appears to be erroneous.  We obtained an ABG i-STAT while in the room which showed  pH of 7.534, pCO2 of 29.6, pO2 of 160, bicarb of 25.  There was also repeated venous gas which was pH 7.4, pCO2 of 40.  -No acute need for ICU transfer at this time -Continue treating underlying severe sepsis - Agree with plan for ammonia, EEG and MRI brain if she does not improve from a mentation standpoint.  Agree with avoiding cefepime  as well as other altering drugs such as narcotics as able  Please reconsult if needed at any time.  Thanks  Labs   CBC: Recent Labs  Lab 02/14/24 1402 02/14/24 1413 02/15/24 0836 02/16/24 0412 02/17/24 0257 02/17/24 1415  WBC 39.9*  --  35.0* 28.8* 31.2*  --   NEUTROABS  --   --  29.8*  --   --   --   HGB 9.8* 12.2 8.1* 8.3* 7.6* 8.2*  HCT 31.4* 36.0 25.3* 25.4* 23.5* 24.0*  MCV 92.9  --  92.7 88.8 89.4  --   PLT 212  --  183 166 178  --    Basic Metabolic Panel: Recent Labs  Lab 02/14/24 1402 02/14/24 1413 02/15/24 0836 02/16/24 0412 02/17/24 0257 02/17/24 1415  NA 135 134* 134* 132* 135 134*  K 4.1 4.1 3.8 3.3* 3.8 3.3*  CL 96* 97* 99 98 99  --   CO2 27  --  21* 27 27  --   GLUCOSE 171* 168* 109* 116* 103*  --   BUN 21* 25* 24* 14 18  --   CREATININE 6.36* 6.10* 6.24* 3.71* 4.76*  --   CALCIUM  8.8*  --  8.3* 7.6* 7.9*  --   MG 1.8  --   --   --   --   --   PHOS  --   --   --   --  2.3*  --    GFR: Estimated Creatinine Clearance: 14.9 mL/min (A) (by C-G formula based on SCr of 4.76 mg/dL (H)). Recent Labs  Lab 02/14/24 1402 02/14/24 1413 02/15/24 0818 02/15/24 0836 02/16/24 0412 02/17/24 0257 02/17/24 1218  WBC 39.9*  --   --  35.0* 28.8* 31.2*  --   LATICACIDVEN  --  2.1* 0.6  --   --  0.9 0.7    Liver Function Tests: Recent Labs  Lab 02/14/24 1402 02/15/24 0836 02/17/24 0257  AST 13* 10*  --   ALT 10 8  --   ALKPHOS 109 87  --   BILITOT 0.9 0.9  --   PROT 6.3* 4.8*  --   ALBUMIN  1.7* <1.5* <1.5*   No results for input(s): LIPASE, AMYLASE in the last 168 hours. Recent Labs  Lab 02/15/24 0818  02/17/24 1300  AMMONIA 29 18   ABG    Component Value Date/Time   PHART 7.534 (H) 02/17/2024 1415   PCO2ART 29.6 (L) 02/17/2024 1415   PO2ART 160 (H) 02/17/2024 1415   HCO3 24.8 02/17/2024 1436   TCO2 26 02/17/2024 1415   ACIDBASEDEF 5.0 (H) 11/23/2022 0218   O2SAT 85.2 02/17/2024 1436    Coagulation Profile: No results for input(s): INR, PROTIME in the last 168 hours.  Cardiac Enzymes: No results for input(s): CKTOTAL, CKMB, CKMBINDEX, TROPONINI in the last 168 hours.  HbA1C: HbA1c, POC (controlled diabetic range)  Date/Time Value Ref Range Status  11/15/2019 04:40 PM 9.3 (A) 0.0 - 7.0 % Final   Hgb A1c MFr Bld  Date/Time Value Ref Range Status  02/15/2024 11:17 AM 4.3 (L) 4.8 - 5.6 % Final    Comment:    (NOTE) Diagnosis of Diabetes The following HbA1c ranges recommended by the American Diabetes Association (ADA) may be used as an aid in the diagnosis of diabetes mellitus.  Hemoglobin             Suggested A1C NGSP%              Diagnosis  <5.7                   Non Diabetic  5.7-6.4                Pre-Diabetic  >6.4                   Diabetic  <7.0                   Glycemic control for                       adults with diabetes.    02/04/2023 07:55 PM 5.7 (H) 4.8 - 5.6 % Final    Comment:    (NOTE) Pre diabetes:          5.7%-6.4%  Diabetes:              >6.4%  Glycemic control for   <7.0% adults with diabetes    CBG: Recent Labs  Lab 02/17/24 0040 02/17/24 0220 02/17/24 0514 02/17/24 0813 02/17/24 1200  GLUCAP 100* 103* 84 84 96   Review of Systems:   As above   Critical care time: na  Tinnie FORBES Furth, PA-C [facility] 02/17/2024 3:07 PM  Please see [email or web address] for pager details.  From 7A-7P if no response, please call [phone] After hours, please call ELink [phone]

## 2024-02-17 NOTE — Care Management Important Message (Signed)
 Important Message  Patient Details  Name: Deborah [name] MRN: [id number] Date of Birth: Sep 17, 1965   Important Message Given:  Yes - Medicare IM     [name] 02/17/2024, 2:47 PM

## 2024-02-17 NOTE — Care Management Important Message (Signed)
 Important Message  Patient Details  Name: Deborah [name] MRN: [id number] Date of Birth: 23-Sep-1965   Important Message Given:  Yes - Medicare IM     [name] 02/17/2024, 2:48 PM

## 2024-02-17 NOTE — Significant Event (Addendum)
 Rapid Response Event Note   Reason for Call :  Called urgently to bedside d/t decreased MS and hypotension(SBP-60s).  Per RN, pt has no IV access except for [facility].  Asked RN to place pt in [place].  Initial Focused Assessment:  Pt lying in bed with eyes closed, in no visible distress. Bed in trendelenburg position. Pt will open eyes to continuous verbal stimulation but will go back to sleep very easily with no stimulation. Pupils 3, equal, reactive.   HR-109, BP-64/46(53), RR-18, SpO2-100% on RA  Red port of HDC accessed and 250cc NS bolus started.   With repeated verbal stimulation, pt able to tell me her last name and follow my commands.   BP increased to 80/46 with NS bolus, trendelenburg position, and stimulation.    Interventions:  Red port of HDC accessed emergently under aseptic technique 250cc NS bolus RFP, CBC, LA EKG  Plan of Care:  Pt BP/MS responding to interventions. Give PO midodrine  if pt wakes up and is able to safely take POs. If BP stays up, please consult IV team to heparin  lock HDC. Please call RRT if further assistance needed.   Event Summary:   MD Notified: Dr. Alfornia  Call Time:0206 Arrival Time:0208 End Upfz:[id number]  Tish Graeme Piety, RN

## 2024-02-17 NOTE — Progress Notes (Signed)
 [facility] Progress Note  1 Day Post-Op  Subjective: CC:  Somnolent but arouses to loud voice or noxious stimuli.   Objective: Vital signs in last 24 hours: Temp:  [97.6 F (36.4 C)-98.8 F (37.1 C)] 98.2 F (36.8 C) (12/12 1200) Pulse Rate:  [94-115] 96 (12/12 1200) Resp:  [12-22] 12 (12/12 1200) BP: (64-127)/(36-68) 124/68 (12/12 1200) SpO2:  [93 %-100 %] 100 % (12/12 1200) Weight:  [96.2 kg] 96.2 kg (12/11 1333) Last BM Date :  (PTA)  Intake/Output from previous day: 12/11 0701 - 12/12 0700 In: 400 [I.V.:400] Out: 25 [Blood:25] Intake/Output this shift: No intake/output data recorded.  PE: Gen:  somnolent, chronically ill appearing Card:  Regular rate and rhythm Pulm:  Normal effort Skin: left thigh with stable warmth and cellulitis, surgical wound left anterior and medial thigh. [place] ~8-10 cm towards left hip. Wound base pink with some fibrinous exudate, no bleeding. Re-packed.    Lab Results:  Recent Labs    02/16/24 0412 02/17/24 0257  WBC 28.8* 31.2*  HGB 8.3* 7.6*  HCT 25.4* 23.5*  PLT 166 178   BMET Recent Labs    02/16/24 0412 02/17/24 0257  NA 132* 135  K 3.3* 3.8  CL 98 99  CO2 27 27  GLUCOSE 116* 103*  BUN 14 18  CREATININE 3.71* 4.76*  CALCIUM  7.6* 7.9*   PT/INR No results for input(s): LABPROT, INR in the last 72 hours. CMP     Component Value Date/Time   NA 135 02/17/2024 0257   NA 137 09/20/2019 1530   K 3.8 02/17/2024 0257   CL 99 02/17/2024 0257   CO2 27 02/17/2024 0257   GLUCOSE 103 (H) 02/17/2024 0257   BUN 18 02/17/2024 0257   BUN 18 09/20/2019 1530   CREATININE 4.76 (H) 02/17/2024 0257   CALCIUM  7.9 (L) 02/17/2024 0257   PROT 4.8 (L) 02/15/2024 0836   ALBUMIN  <1.5 (L) 02/17/2024 0257   AST 10 (L) 02/15/2024 0836   ALT 8 02/15/2024 0836   ALKPHOS 87 02/15/2024 0836   BILITOT 0.9 02/15/2024 0836   GFRNONAA 10 (L) 02/17/2024 0257   GFRAA 49 (L) 11/30/2019 0356   Lipase     Component Value  Date/Time   LIPASE 24 06/03/2023 0905       Studies/Results: DG CHEST PORT 1 VIEW Result Date: 02/17/2024 CLINICAL DATA:  Central line placement EXAM: PORTABLE CHEST 1 VIEW COMPARISON:  Three days ago FINDINGS: The heart size and mediastinal contours are within normal limits. Both lungs are clear. The visualized skeletal structures are unremarkable. Stable right internal jugular dialysis catheter. Interval placement of left internal jugular catheter with distal tip in expected position cavoatrial junction. IMPRESSION: Interval placement of left internal jugular catheter with distal tip in expected position of cavoatrial junction. Electronically Signed   By: Lynwood Landy Raddle M.D.   On: 02/17/2024 08:34   CT ABDOMEN PELVIS W CONTRAST Addendum Date: 02/16/2024 ADDENDUM REPORT: 02/16/2024 10:34 ADDENDUM: These results were called by telephone at the time of interpretation on 02/16/2024 at 10:34 am to provider Dr. SHERLON , who verbally acknowledged these results. Electronically Signed   By: Toribio Agreste M.D.   On: 02/16/2024 10:34   Result Date: 02/16/2024 CLINICAL DATA:  Left groin/hip swelling with erythema. Possible sepsis. EXAM: CT ABDOMEN AND PELVIS WITH CONTRAST TECHNIQUE: Multidetector CT imaging of the abdomen and pelvis was performed using the standard protocol following bolus administration of intravenous contrast. RADIATION DOSE REDUCTION: This exam was performed according  to the departmental dose-optimization program which includes automated exposure control, adjustment of the mA and/or kV according to patient size and/or use of iterative reconstruction technique. CONTRAST:  75mL OMNIPAQUE  IOHEXOL  350 MG/ML SOLN COMPARISON:  05/29/2023 FINDINGS: Lower chest: Heart is normal size. Dialysis catheter tip over the right atrium. Visualized lung bases demonstrate mild bibasilar linear density compatible with atelectasis. Hepatobiliary: Previous cholecystectomy. Liver and biliary tree are normal.  Pancreas: Normal. Spleen: Normal. Adrenals/Urinary Tract: Adrenal glands are normal. Kidneys are normal in size without hydronephrosis or nephrolithiasis. Ureters and bladder are normal. Stomach/Bowel: Stomach is unremarkable. Couple small bowel loops over the left abdomen at the upper limits of normal in diameter. Small bowel is otherwise unremarkable. Appendix is normal. Mild diverticulosis of the sigmoid colon. Most of the colon is decompressed. Subtle diffuse wall thickening of the rectum. Vascular/Lymphatic: Minimal calcified plaque over the abdominal aorta which is normal caliber. No adenopathy. Reproductive: No change in 4.4 cm calcified oval mass in the left adnexa likely serosal fibroid. Uterus and adnexal regions are otherwise unchanged. Other: No free peritoneal fluid or focal inflammatory change. No free peritoneal air. Oval fluid collection with significant associated mottled air over the anterior subcutaneous fat of the left proximal thigh measuring 3.3 x 15.5 cm in AP and transverse dimension along its anterior component as this communicates with a medial component extending posteriorly and abutting the skin measuring approximately 2.8 x 10.3 cm in transverse in AP dimension. In keeping with patient's history, this is compatible with soft tissue infection/abscess. Musculoskeletal: Stable grade 1 anterolisthesis of L4 on L5. Moderate spondylosis at the L5-S1 level with disc space narrowing present. IMPRESSION: 1. Oval fluid collection with significant associated mottled air over the anterior subcutaneous fat of the left proximal thigh measuring 3.3 x 15.5 cm in AP and transverse dimension along its anterior component as this communicates with a medial component extending posteriorly and abutting the skin measuring approximately 2.8 x 10.3 cm in transverse in AP dimension. In keeping with patient's history, this is compatible with soft tissue infection/abscess. 2. Subtle diffuse wall thickening of the  rectum which may be due to lack of distension versus mild proctitis. 3. Mild sigmoid diverticulosis. 4. Aortic atherosclerosis. Aortic Atherosclerosis (ICD10-I70.0). These results will be called to the ordering clinician or representative by the Radiologist Assistant, and communication documented in the PACS or [employer]. Electronically Signed: By: Toribio Agreste M.D. On: 02/16/2024 10:24   CT hip left without contrast Result Date: 02/16/2024 EXAM: CT left Hip, without IV contrast CLINICAL HISTORY: sepsis, left groin and pannus edema TECHNIQUE: Axial images were acquired through the left hip without IV contrast. Reformatted images were reviewed. Dose reduction technique was used including one or more of the following: automated exposure control, adjustment of mA and kV according to patient size, and/or iterative reconstruction. COMPARISON: 05/29/2023 FINDINGS: BONES: No acute fracture or focal osseous lesion. JOINTS: No dislocation. No findings of hip effusion. Moderate degenerative arthropathy of the left hip. SOFT TISSUES: A 26.6 x 2.8 x 6.1 cm subcutaneous abscess is present involving the left anterior upper leg and tracking into the left medial upper thigh region just below the perineum. Multiple loculations of internal gas density observed. Surrounding edema/stranding, cannot completely exclude fasciitis. Drainage and potential surgical management recommended. No significant regional muscular involvement. Left fundal subserosal fibroid extending along the left adnexa. Atheromatous vascular calcifications. Small reactive left inguinal lymph nodes. IMPRESSION: 1. 240 cc subcutaneous abscess of the left anterior upper leg tracking into the left medial  upper thigh just below the perineum, with internal gas and surrounding edema/stranding; fasciitis cannot be excluded; no significant regional muscular involvement; drainage and potential surgical management recommended. 2. Moderate degenerative arthropathy  of the left hip; no hip effusion. 3. Several additional chronic and incidental findings, including small reactive left inguinal lymph nodes, a left fundal subserosal fibroid extending along the left adnexa, and atheromatous vascular calcifications, see report body for details. These results will be telephoned to the referring provider or the referring provider's representative by professional radiology assistant [facility]) personnel, with communication documented in the clario dashboard. Electronically signed by: Ryan Salvage MD 02/16/2024 10:02 AM EST RP Workstation: [id number]    Anti-infectives: Anti-infectives (From admission, onward)    Start     Dose/Rate Route Frequency Ordered Stop   02/18/24 1800  meropenem  (MERREM ) 500 mg in sodium chloride  0.9 % 100 mL IVPB        500 mg 200 mL/hr over 30 Minutes Intravenous Every 24 hours 02/17/24 0941     02/17/24 1030  meropenem  (MERREM ) 500 mg in sodium chloride  0.9 % 100 mL IVPB        500 mg 200 mL/hr over 30 Minutes Intravenous  Once 02/17/24 0941 02/17/24 1059   02/17/24 1000  meropenem  (MERREM ) 500 mg in sodium chloride  0.9 % 100 mL IVPB  Status:  Discontinued        500 mg 200 mL/hr over 30 Minutes Intravenous Every 24 hours 02/16/24 1045 02/17/24 0941   02/16/24 1145  linezolid (ZYVOX) IVPB 600 mg        600 mg 300 mL/hr over 60 Minutes Intravenous Every 12 hours 02/16/24 1045     02/16/24 1145  meropenem  (MERREM ) 500 mg in sodium chloride  0.9 % 100 mL IVPB  Status:  Discontinued        500 mg 200 mL/hr over 30 Minutes Intravenous  Once 02/16/24 1045 02/17/24 0941   02/15/24 2000  ceFEPIme  (MAXIPIME ) 1 g in sodium chloride  0.9 % 100 mL IVPB  Status:  Discontinued        1 g 200 mL/hr over 30 Minutes Intravenous Every 24 hours 02/14/24 1649 02/16/24 1045   02/15/24 1700  vancomycin  (VANCOCIN ) IVPB 1000 mg/200 mL premix        1,000 mg 200 mL/hr over 60 Minutes Intravenous  Once 02/15/24 1528 02/15/24 2015   02/15/24 1415  vancomycin   variable dose per unstable renal function (pharmacist dosing)  Status:  Discontinued         Does not apply See admin instructions 02/15/24 1415 02/16/24 1045   02/15/24 0600  metroNIDAZOLE  (FLAGYL ) IVPB 500 mg  Status:  Discontinued        500 mg 100 mL/hr over 60 Minutes Intravenous Every 12 hours 02/14/24 2305 02/16/24 1046   02/14/24 1445  aztreonam  (AZACTAM ) 2 g in sodium chloride  0.9 % 100 mL IVPB  Status:  Discontinued        2 g 200 mL/hr over 30 Minutes Intravenous  Once 02/14/24 1435 02/14/24 1440   02/14/24 1445  metroNIDAZOLE  (FLAGYL ) IVPB 500 mg        500 mg 100 mL/hr over 60 Minutes Intravenous  Once 02/14/24 1435 02/14/24 1844   02/14/24 1445  vancomycin  (VANCOCIN ) IVPB 1000 mg/200 mL premix  Status:  Discontinued        1,000 mg 200 mL/hr over 60 Minutes Intravenous  Once 02/14/24 1435 02/14/24 1444   02/14/24 1445  ceFEPIme  (MAXIPIME ) 2 g in sodium chloride  0.9 %  100 mL IVPB        2 g 200 mL/hr over 30 Minutes Intravenous  Once 02/14/24 1444 02/14/24 1606   02/14/24 1445  vancomycin  (VANCOREADY) IVPB 2000 mg/400 mL        2,000 mg 200 mL/hr over 120 Minutes Intravenous  Once 02/14/24 1444 02/14/24 1848        Assessment/Plan  Left thigh abscess S/P I&D 12/11 Dr. Vernetta - afebrile, soft BPs (baseline) -  WBC 31, monitor - twice daily moist-to-dry dressing changes.  - continue IV abx and follow cultures  - surgery will follow   FEN carb mod ID: merrem , linezolid VTE: SQH Dispo: Progressive care   Encephalopathy ESRD on HD Anemia of ESRD Chronic hypotension DM2 HLD Afib - in sinus here Gout Depression/anxiety  LOS: 3 days   I reviewed nursing notes, hospitalist notes, last 24 h vitals and pain scores, last 48 h intake and output, last 24 h labs and trends, and last 24 h imaging results.  This care required straight-forward level of medical decision making.   Almarie Pringle, PA-C [facility] Please see Amion for pager number  during day hours 7:00am-4:30pm

## 2024-02-17 NOTE — Progress Notes (Signed)
 [facility] Progress Note   Subjective:   Had hypotension and AMS overnight, central line was placed this morning. She received NS, midodrine , and albumin . BP in 90's. Patient is still altered on our exam, opens eyes to voice but seems very tired.   Objective Vitals:   02/17/24 0415 02/17/24 0512 02/17/24 0746 02/17/24 0800  BP: (!) 91/53 (!) 98/51 (!) 99/51 (!) 93/51  Pulse: 94 98 95 98  Resp: 16 20 16 17   Temp:  98.8 F (37.1 C)  97.7 F (36.5 C)  TempSrc:  Oral  Oral  SpO2: 100% 100% 99% 100%  Weight:      Height:       Physical Exam General: chronically ill appearing female, NAD Heart: RRR, no murmurs Lungs: CTA anteriorly, respirations unlabored Abdomen: Soft, non-distended Extremities: No edema b/l lower extremities, R BKA Dialysis Access: [facility]  Additional Objective Labs: Basic Metabolic Panel: Recent Labs  Lab 02/15/24 0836 02/16/24 0412 02/17/24 0257  NA 134* 132* 135  K 3.8 3.3* 3.8  CL 99 98 99  CO2 21* 27 27  GLUCOSE 109* 116* 103*  BUN 24* 14 18  CREATININE 6.24* 3.71* 4.76*  CALCIUM  8.3* 7.6* 7.9*  PHOS  --   --  2.3*   Liver Function Tests: Recent Labs  Lab 02/14/24 1402 02/15/24 0836 02/17/24 0257  AST 13* 10*  --   ALT 10 8  --   ALKPHOS 109 87  --   BILITOT 0.9 0.9  --   PROT 6.3* 4.8*  --   ALBUMIN  1.7* <1.5* <1.5*   No results for input(s): LIPASE, AMYLASE in the last 168 hours. CBC: Recent Labs  Lab 02/14/24 1402 02/14/24 1413 02/15/24 0836 02/16/24 0412 02/17/24 0257  WBC 39.9*  --  35.0* 28.8* 31.2*  NEUTROABS  --   --  29.8*  --   --   HGB 9.8*   < > 8.1* 8.3* 7.6*  HCT 31.4*   < > 25.3* 25.4* 23.5*  MCV 92.9  --  92.7 88.8 89.4  PLT 212  --  183 166 178   < > = values in this interval not displayed.   Blood Culture    Component Value Date/Time   SDES FLUID 02/16/2024 1430   SPECREQUEST LEFT THIGH ABSCESS 02/16/2024 1430   CULT  02/16/2024 1430    MODERATE PROTEUS MIRABILIS SUSCEPTIBILITIES TO  FOLLOW Performed at [facility], [address] [place], [place] [address]    REPTSTATUS PENDING 02/16/2024 1430    Cardiac Enzymes: No results for input(s): CKTOTAL, CKMB, CKMBINDEX, TROPONINI in the last 168 hours. CBG: Recent Labs  Lab 02/16/24 2327 02/17/24 0040 02/17/24 0220 02/17/24 0514 02/17/24 0813  GLUCAP 99 100* 103* 84 84   Iron  Studies: No results for input(s): IRON , TIBC, TRANSFERRIN, FERRITIN in the last 72 hours. @lablastinr3 @ Studies/Results: DG CHEST PORT 1 VIEW Result Date: 02/17/2024 CLINICAL DATA:  Central line placement EXAM: PORTABLE CHEST 1 VIEW COMPARISON:  Three days ago FINDINGS: The heart size and mediastinal contours are within normal limits. Both lungs are clear. The visualized skeletal structures are unremarkable. Stable right internal jugular dialysis catheter. Interval placement of left internal jugular catheter with distal tip in expected position cavoatrial junction. IMPRESSION: Interval placement of left internal jugular catheter with distal tip in expected position of cavoatrial junction. Electronically Signed   By: Lynwood Landy Raddle M.D.   On: 02/17/2024 08:34   CT ABDOMEN PELVIS W CONTRAST Addendum Date: 02/16/2024 ADDENDUM  REPORT: 02/16/2024 10:34 ADDENDUM: These results were called by telephone at the time of interpretation on 02/16/2024 at 10:34 am to provider Dr. SHERLON , who verbally acknowledged these results. Electronically Signed   By: Toribio Agreste M.D.   On: 02/16/2024 10:34   Result Date: 02/16/2024 CLINICAL DATA:  Left groin/hip swelling with erythema. Possible sepsis. EXAM: CT ABDOMEN AND PELVIS WITH CONTRAST TECHNIQUE: Multidetector CT imaging of the abdomen and pelvis was performed using the standard protocol following bolus administration of intravenous contrast. RADIATION DOSE REDUCTION: This exam was performed according to the departmental dose-optimization program which includes automated exposure  control, adjustment of the mA and/or kV according to patient size and/or use of iterative reconstruction technique. CONTRAST:  75mL OMNIPAQUE  IOHEXOL  350 MG/ML SOLN COMPARISON:  05/29/2023 FINDINGS: Lower chest: Heart is normal size. Dialysis catheter tip over the right atrium. Visualized lung bases demonstrate mild bibasilar linear density compatible with atelectasis. Hepatobiliary: Previous cholecystectomy. Liver and biliary tree are normal. Pancreas: Normal. Spleen: Normal. Adrenals/Urinary Tract: Adrenal glands are normal. Kidneys are normal in size without hydronephrosis or nephrolithiasis. Ureters and bladder are normal. Stomach/Bowel: Stomach is unremarkable. Couple small bowel loops over the left abdomen at the upper limits of normal in diameter. Small bowel is otherwise unremarkable. Appendix is normal. Mild diverticulosis of the sigmoid colon. Most of the colon is decompressed. Subtle diffuse wall thickening of the rectum. Vascular/Lymphatic: Minimal calcified plaque over the abdominal aorta which is normal caliber. No adenopathy. Reproductive: No change in 4.4 cm calcified oval mass in the left adnexa likely serosal fibroid. Uterus and adnexal regions are otherwise unchanged. Other: No free peritoneal fluid or focal inflammatory change. No free peritoneal air. Oval fluid collection with significant associated mottled air over the anterior subcutaneous fat of the left proximal thigh measuring 3.3 x 15.5 cm in AP and transverse dimension along its anterior component as this communicates with a medial component extending posteriorly and abutting the skin measuring approximately 2.8 x 10.3 cm in transverse in AP dimension. In keeping with patient's history, this is compatible with soft tissue infection/abscess. Musculoskeletal: Stable grade 1 anterolisthesis of L4 on L5. Moderate spondylosis at the L5-S1 level with disc space narrowing present. IMPRESSION: 1. Oval fluid collection with significant associated  mottled air over the anterior subcutaneous fat of the left proximal thigh measuring 3.3 x 15.5 cm in AP and transverse dimension along its anterior component as this communicates with a medial component extending posteriorly and abutting the skin measuring approximately 2.8 x 10.3 cm in transverse in AP dimension. In keeping with patient's history, this is compatible with soft tissue infection/abscess. 2. Subtle diffuse wall thickening of the rectum which may be due to lack of distension versus mild proctitis. 3. Mild sigmoid diverticulosis. 4. Aortic atherosclerosis. Aortic Atherosclerosis (ICD10-I70.0). These results will be called to the ordering clinician or representative by the Radiologist Assistant, and communication documented in the PACS or [employer]. Electronically Signed: By: Toribio Agreste M.D. On: 02/16/2024 10:24   CT hip left without contrast Result Date: 02/16/2024 EXAM: CT left Hip, without IV contrast CLINICAL HISTORY: sepsis, left groin and pannus edema TECHNIQUE: Axial images were acquired through the left hip without IV contrast. Reformatted images were reviewed. Dose reduction technique was used including one or more of the following: automated exposure control, adjustment of mA and kV according to patient size, and/or iterative reconstruction. COMPARISON: 05/29/2023 FINDINGS: BONES: No acute fracture or focal osseous lesion. JOINTS: No dislocation. No findings of hip effusion. Moderate degenerative arthropathy  of the left hip. SOFT TISSUES: A 26.6 x 2.8 x 6.1 cm subcutaneous abscess is present involving the left anterior upper leg and tracking into the left medial upper thigh region just below the perineum. Multiple loculations of internal gas density observed. Surrounding edema/stranding, cannot completely exclude fasciitis. Drainage and potential surgical management recommended. No significant regional muscular involvement. Left fundal subserosal fibroid extending along the left  adnexa. Atheromatous vascular calcifications. Small reactive left inguinal lymph nodes. IMPRESSION: 1. 240 cc subcutaneous abscess of the left anterior upper leg tracking into the left medial upper thigh just below the perineum, with internal gas and surrounding edema/stranding; fasciitis cannot be excluded; no significant regional muscular involvement; drainage and potential surgical management recommended. 2. Moderate degenerative arthropathy of the left hip; no hip effusion. 3. Several additional chronic and incidental findings, including small reactive left inguinal lymph nodes, a left fundal subserosal fibroid extending along the left adnexa, and atheromatous vascular calcifications, see report body for details. These results will be telephoned to the referring provider or the referring provider's representative by professional radiology assistant [facility]) personnel, with communication documented in the clario dashboard. Electronically signed by: Ryan Salvage MD 02/16/2024 10:02 AM EST RP Workstation: [id number]   Medications:  albumin  human 25 g (02/17/24 0851)   anticoagulant sodium citrate      linezolid (ZYVOX) IV 600 mg (02/17/24 0907)   [START ON 02/18/2024] meropenem  (MERREM ) IV     meropenem  (MERREM ) IV      (feeding supplement) PROSource Plus  30 mL Oral BID BM   allopurinol   100 mg Oral BID   atorvastatin   10 mg Oral QPM   cholestyramine  light  4 g Oral BID   famotidine   20 mg Oral QAC breakfast   ferric citrate   420 mg Oral TID WC   folic acid   1 mg Oral Daily   heparin   5,000 Units Subcutaneous Q8H   hyoscyamine   0.125 mg Sublingual TID   insulin  aspart  0-6 Units Subcutaneous TID WC   midodrine   10 mg Oral TID   pantoprazole   40 mg Oral Daily    Dialysis Orders: [facility] 4:00 400BFR EDW 96.1 kg 3K/2.5Ca TDC No bolus heparin    Assessment/Plan: AMS/ possible sepsis: WBC remain elevated, on antibiotics per primary team. Changed from cefepime  to meropenem .  L thigh  abscess. Surgery consulted, s/p I&D on 02/16/24 ESRD: on HD TTS. HD on regular schedule tomorrow Hypotension.  Has chronic low BP's takes midodrine  pre HD three times per week. Continue here.  Volume: bp's soft, no pitting edema. No UF with HD tomorrow.  Anemia. Hgb 7.6.  Mircera 225  given 12/6 at OP clinic.   Lucie Collet, PA-C 02/17/2024, 10:17 AM  [facility] Pager: [phone]

## 2024-02-17 NOTE — Progress Notes (Signed)
 Lab called with critical VBG results of ph<6.95, CO2 >123 and 02> 507.  Dr. Sherlon notified of results and order for a stat re-draw.  Austina Constantin, RN

## 2024-02-17 NOTE — Procedures (Signed)
 Central Venous Catheter Insertion Procedure Note  SHANNELLE ALGUIRE  [id number]  Jan 11, 1966  Date:02/17/2024  Time:8:04 AM   Provider Performing:Jansen Goodpasture Meade   Procedure: Insertion of Non-tunneled Central Venous Catheter(36556) with US  guidance (23062)   Indication(s) Medication administration and Difficult access  Consent Risks of the procedure as well as the alternatives and risks of each were explained to the patient and/or caregiver.  Consent for the procedure was obtained and is signed in the bedside chart  Anesthesia Topical only with 1% lidocaine    Timeout Verified patient identification, verified procedure, site/side was marked, verified correct patient position, special equipment/implants available, medications/allergies/relevant history reviewed, required imaging and test results available.  Sterile Technique Maximal sterile technique including full sterile barrier drape, hand hygiene, sterile gown, sterile gloves, mask, hair covering, sterile ultrasound probe cover (if used).  Procedure Description Area of catheter insertion was cleaned with chlorhexidine  and draped in sterile fashion.  With real-time ultrasound guidance a central venous catheter was placed into the left internal jugular vein. Nonpulsatile blood flow and easy flushing noted in all ports.  The catheter was sutured in place and sterile dressing applied.  Complications/Tolerance None; patient tolerated the procedure well. Chest X-ray is ordered to verify placement for internal jugular or subclavian cannulation.   Chest x-ray is not ordered for femoral cannulation.  EBL Minimal  Specimen(s) None    Sammi Meade, PA - [facility] For pager details, please see AMION or use Epic chat  After 1900, please call [facility] for cross coverage needs 02/17/2024, 8:05 AM

## 2024-02-18 LAB — GLUCOSE, CAPILLARY
Glucose-Capillary: 124 mg/dL — ABNORMAL HIGH (ref 70–99)
Glucose-Capillary: 185 mg/dL — ABNORMAL HIGH (ref 70–99)
Glucose-Capillary: 190 mg/dL — ABNORMAL HIGH (ref 70–99)
Glucose-Capillary: 90 mg/dL (ref 70–99)
Glucose-Capillary: 144 mg/dL — ABNORMAL HIGH (ref 70–99)
Glucose-Capillary: 148 mg/dL — ABNORMAL HIGH (ref 70–99)

## 2024-02-18 LAB — RENAL FUNCTION PANEL
Albumin: 1.8 g/dL — ABNORMAL LOW (ref 3.5–5.0)
Albumin: 2 g/dL — ABNORMAL LOW (ref 3.5–5.0)
Anion gap: 9 (ref 5–15)
Anion gap: 6 (ref 5–15)
BUN: 8 mg/dL (ref 6–20)
BUN: <5 mg/dL — ABNORMAL LOW (ref 6–20)
CO2: 28 mmol/L (ref 22–32)
CO2: 29 mmol/L (ref 22–32)
Calcium: 7.4 mg/dL — ABNORMAL LOW (ref 8.9–10.3)
Calcium: 7.2 mg/dL — ABNORMAL LOW (ref 8.9–10.3)
Chloride: 96 mmol/L — ABNORMAL LOW (ref 98–111)
Chloride: 98 mmol/L (ref 98–111)
Creatinine, Ser: 2.77 mg/dL — ABNORMAL HIGH (ref 0.44–1.00)
Creatinine, Ser: 1.43 mg/dL — ABNORMAL HIGH (ref 0.44–1.00)
GFR, Estimated: 19 mL/min — ABNORMAL LOW (ref >60)
GFR, Estimated: 42 mL/min — ABNORMAL LOW (ref >60)
Glucose, Bld: 139 mg/dL — ABNORMAL HIGH (ref 70–99)
Glucose, Bld: 104 mg/dL — ABNORMAL HIGH (ref 70–99)
Phosphorus: 1.1 mg/dL — ABNORMAL LOW (ref 2.5–4.6)
Phosphorus: 1.1 mg/dL — ABNORMAL LOW (ref 2.5–4.6)
Potassium: 3 mmol/L — ABNORMAL LOW (ref 3.5–5.1)
Potassium: 3.6 mmol/L (ref 3.5–5.1)
Sodium: 133 mmol/L — ABNORMAL LOW (ref 135–145)
Sodium: 133 mmol/L — ABNORMAL LOW (ref 135–145)

## 2024-02-18 LAB — CBC
HCT: 23.7 % — ABNORMAL LOW (ref 36.0–46.0)
Hemoglobin: 7.8 g/dL — ABNORMAL LOW (ref 12.0–15.0)
MCH: 28.4 pg (ref 26.0–34.0)
MCHC: 32.9 g/dL (ref 30.0–36.0)
MCV: 86.2 fL (ref 80.0–100.0)
Platelets: 173 K/uL (ref 150–400)
RBC: 2.75 MIL/uL — ABNORMAL LOW (ref 3.87–5.11)
RDW: 16.3 % — ABNORMAL HIGH (ref 11.5–15.5)
WBC: 24.6 K/uL — ABNORMAL HIGH (ref 4.0–10.5)
nRBC: 0 % (ref 0.0–0.2)

## 2024-02-18 MED ORDER — THIAMINE MONONITRATE 100 MG PO TABS
200.0000 mg | ORAL_TABLET | Freq: Every day | ORAL | Status: AC
  Administered 2024-02-19 – 2024-02-23 (×4): 200 mg
  Filled 2024-02-18 (×5): qty 2

## 2024-02-18 MED ORDER — SODIUM PHOSPHATES 45 MMOLE/15ML IV SOLN
30.0000 mmol | Freq: Once | INTRAVENOUS | Status: AC
  Administered 2024-02-18: 30 mmol via INTRAVENOUS
  Filled 2024-02-18: qty 10

## 2024-02-18 MED ORDER — POTASSIUM PHOSPHATES 15 MMOLE/5ML IV SOLN
30.0000 mmol | Freq: Once | INTRAVENOUS | Status: AC
  Administered 2024-02-18: 30 mmol via INTRAVENOUS
  Filled 2024-02-18: qty 10

## 2024-02-18 MED ORDER — FAMOTIDINE 20 MG PO TABS
20.0000 mg | ORAL_TABLET | Freq: Every day | ORAL | Status: DC
  Administered 2024-02-19 – 2024-02-24 (×5): 20 mg
  Filled 2024-02-18 (×6): qty 1

## 2024-02-18 MED ORDER — K PHOS MONO-SOD PHOS DI & MONO 155-852-130 MG PO TABS
500.0000 mg | ORAL_TABLET | ORAL | Status: AC
  Administered 2024-02-18 – 2024-02-19 (×2): 500 mg via NASOGASTRIC
  Filled 2024-02-18 (×2): qty 2

## 2024-02-18 MED ORDER — ALLOPURINOL 100 MG PO TABS
100.0000 mg | ORAL_TABLET | Freq: Two times a day (BID) | ORAL | Status: DC
  Administered 2024-02-19 – 2024-02-25 (×11): 100 mg
  Filled 2024-02-18 (×12): qty 1

## 2024-02-18 MED ORDER — HEPARIN SODIUM (PORCINE) 1000 UNIT/ML IJ SOLN
INTRAMUSCULAR | Status: AC
  Filled 2024-02-18: qty 4

## 2024-02-18 MED ORDER — FOLIC ACID 1 MG PO TABS
1.0000 mg | ORAL_TABLET | Freq: Every day | ORAL | Status: DC
  Administered 2024-02-19 – 2024-02-24 (×5): 1 mg
  Filled 2024-02-18 (×6): qty 1

## 2024-02-18 MED ORDER — ATORVASTATIN CALCIUM 10 MG PO TABS
10.0000 mg | ORAL_TABLET | Freq: Every evening | ORAL | Status: DC
  Administered 2024-02-19 – 2024-02-25 (×7): 10 mg
  Filled 2024-02-18 (×7): qty 1

## 2024-02-18 MED ORDER — CHOLESTYRAMINE LIGHT 4 G PO PACK
4.0000 g | PACK | Freq: Two times a day (BID) | ORAL | Status: DC
  Administered 2024-02-19 – 2024-02-24 (×9): 4 g
  Filled 2024-02-18 (×14): qty 1

## 2024-02-18 MED ORDER — MIDODRINE HCL 5 MG PO TABS
10.0000 mg | ORAL_TABLET | Freq: Three times a day (TID) | ORAL | Status: DC
  Administered 2024-02-19 – 2024-02-25 (×19): 10 mg
  Filled 2024-02-18 (×17): qty 2

## 2024-02-18 NOTE — Plan of Care (Signed)
  Problem: Education: Goal: Ability to describe self-care measures that may prevent or decrease complications (Diabetes Survival Skills Education) will improve Outcome: Progressing   Problem: Coping: Goal: Ability to adjust to condition or change in health will improve Outcome: Progressing   Problem: Skin Integrity: Goal: Risk for impaired skin integrity will decrease Outcome: Progressing

## 2024-02-18 NOTE — Anesthesia Postprocedure Evaluation (Signed)
 Anesthesia Post Note  Patient: Deborah [name]  Procedure(s) Performed: INCISION AND DRAINAGE, ABSCESS (Left: Thigh)     Patient location during evaluation: PACU Anesthesia Type: General Level of consciousness: awake and alert Pain management: pain level controlled Vital Signs Assessment: post-procedure vital signs reviewed and stable Respiratory status: spontaneous breathing, nonlabored ventilation, respiratory function stable and patient connected to nasal cannula oxygen Cardiovascular status: blood pressure returned to baseline and stable Postop Assessment: no apparent nausea or vomiting Anesthetic complications: no   No notable events documented.               Manoj Enriquez D Roch Quach

## 2024-02-18 NOTE — Progress Notes (Signed)
 PROGRESS NOTE   Deborah [name]  FMW:[id number]    DOB: 1965/03/20    DOA: 02/14/2024  PCP: Pcp, No   I have briefly reviewed patients previous medical records in [facility]   Brief Hospital Course:   58 year old female with medical history significant for ESRD on HD, paroxysmal A-fib, DM2, chronic pain, HLD, R BKA, presented to the ED via EMS from her dialysis center with altered mental status, lethargy, alert and oriented x 1, slightly hypotensive and tachycardic, reportedly did not get dialysis, had left-sided deficits from prior stroke.  Admitted for acute metabolic encephalopathy of unclear etiology, remained altered 12/10.  Workup significant for severe sepsis, she was found to have significant left thigh abscess, went to the OR 12/11.   Assessment & Plan:   Acute metabolic encephalopathy - Due to sepsis -CT head with no acute findings -MRI brain with no acute finding - avoid cefepime , now on meropenem  - Ammonia within normal limit - EEG is pending. - Continue with thiamine  - Unsafe to swallow yet, continue with tube feeds - Initial VBG significant for CO2 retention and acidosis, but appears to be an error as repeat ABG no CO2 retention, PCCM input greatly appreciated  Severe sepsis, left thigh - due to thigh, left groin abscess -The abdomen/pelvis and left thigh area noted. - Continue with broad-spectrum empiric antibiotic coverage.  Will change from vancomycin , cefepime  and Flagyl  to meropenem  and Zyvox  - Surgery consulted, status post I&D 12/11 by Dr. Vernetta  - Follow on intraoperative cultures, growing Proteus Mirabella's (appears pansensitive, but continue with current broad-spectrum coverage meropenem  and Zyvox  as still pending for gram-positive rods and cocci) -continue with wound care   ESRD  - Renal consulted, HD per renal  Diabetes mellitus type 2 Well-controlled based on prior hemoglobin A1c from 2024 of 5.7%. - SSI every 4 hours while patient is  altered/NPO.  Controlled.  Resume diet when able/safe to tolerate p.o.   Chronic hypotension Complicated by hemodialysis. Patient is on midodrine  before hemodialysis as an outpatient.   - Hypotensive overnight requiring fluid bolus. - Continue with IV albumin    Hyperlipidemia Noted.  Resume Lipitor   History of paroxysmal atrial fibrillation Noted.  Patient does not have anticoagulation or rate control medication list on home medication list.  Patient is currently in sinus rhythm.   Gout Allopurinol    Depression Anxiety Patient is listed as taking BuSpar , risperidone , trazodone , Remeron .  Holding most of these medications at this time until further improvement and reassessment of mental status.   History of nausea vomiting Patient without current symptoms.  Obesity class I Severe hypophosphatemia Hypokalemia PCM Hypoalbuminemia - Body mass index is 33.98 kg/m. - Appears to be severely malnourished, started on  cortrak tube feed - High risk for refeeding syndrome, phosphorus and potassium low today, started on supplements, monitor closely  Anemia of ESRD Received 1 unit PRBC 12/12 specially with hypotension  Body mass index is 33.98 kg/m.   DVT prophylaxis: heparin  injection 5,000 Units Start: 02/14/24 2315     Code Status: Full Code:  Family Communication: Discussed with mother at bedside Disposition:  Status is: Inpatient Remains inpatient appropriate because: Ongoing AMS.     Consultants:   Nephrology General Surgery PCCM  Procedures:   Hemodialysis Left thigh abscess I&D by Dr. Vernetta 02/16/2024 Left subclavian TLC placement by PCCM 02/17/2024   Subjective:   Remains altered, core Trak inserted yesterday, started on tube feed Objective:   Vitals:   02/18/24 1259 02/18/24 1315  02/18/24 1330 02/18/24 1400  BP: (!) 138/104 (P) 129/76 (!) 124/107 129/64  Pulse: 90 91 94 94  Resp: 13 14 13 20   Temp:      TempSrc:      SpO2: 100% 100% 100% 100%   Weight:      Height:        Somnolent, confused, but more arousable, chronically ill-appearing, deconditioned  Lung clear to auscultation  Regular rate and rhythm  Abdomen soft  Left groin bandaged  Right BKA     Data Reviewed:   I have personally reviewed following labs and imaging studies   CBC: Recent Labs  Lab 02/15/24 0836 02/16/24 0412 02/17/24 0257 02/17/24 1415 02/18/24 0321  WBC 35.0* 28.8* 31.2*  --  24.6*  NEUTROABS 29.8*  --   --   --   --   HGB 8.1* 8.3* 7.6* 8.2* 7.8*  HCT 25.3* 25.4* 23.5* 24.0* 23.7*  MCV 92.7 88.8 89.4  --  86.2  PLT 183 166 178  --  173    Basic Metabolic Panel: Recent Labs  Lab 02/14/24 1402 02/14/24 1413 02/15/24 0836 02/16/24 0412 02/17/24 0257 02/17/24 1415 02/18/24 0321  NA 135 134* 134* 132* 135 134* 133*  K 4.1 4.1 3.8 3.3* 3.8 3.3* 3.0*  CL 96* 97* 99 98 99  --  96*  CO2 27  --  21* 27 27  --  28  GLUCOSE 171* 168* 109* 116* 103*  --  139*  BUN 21* 25* 24* 14 18  --  8  CREATININE 6.36* 6.10* 6.24* 3.71* 4.76*  --  2.77*  CALCIUM  8.8*  --  8.3* 7.6* 7.9*  --  7.4*  MG 1.8  --   --   --  1.6*  --   --   PHOS  --   --   --   --  2.3*  --  1.1*    Liver Function Tests: Recent Labs  Lab 02/14/24 1402 02/15/24 0836 02/17/24 0257 02/18/24 0321  AST 13* 10*  --   --   ALT 10 8  --   --   ALKPHOS 109 87  --   --   BILITOT 0.9 0.9  --   --   PROT 6.3* 4.8*  --   --   ALBUMIN  1.7* <1.5* <1.5* 1.8*    CBG: Recent Labs  Lab 02/18/24 0227 02/18/24 0747 02/18/24 1104  GLUCAP 124* 185* 190*    Microbiology Studies:   Recent Results (from the past 240 hours)  Culture, blood (single)     Status: None (Preliminary result)   Collection Time: 02/14/24  1:10 PM   Specimen: BLOOD RIGHT FOREARM  Result Value Ref Range Status   Specimen Description BLOOD RIGHT FOREARM  Final   Special Requests   Final    BOTTLES DRAWN AEROBIC AND ANAEROBIC Blood Culture results may not be optimal due to an inadequate volume  of blood received in culture bottles   Culture   Final    NO GROWTH 4 DAYS Performed at [facility], [address] [place], [place] [address]    Report Status PENDING  Incomplete  Resp panel by RT-PCR (RSV, Flu A&B, Covid) Anterior Nasal Swab     Status: None   Collection Time: 02/14/24  3:23 PM   Specimen: Anterior Nasal Swab  Result Value Ref Range Status   SARS Coronavirus 2 by RT PCR NEGATIVE NEGATIVE Final   Influenza A by PCR NEGATIVE NEGATIVE  Final   Influenza B by PCR NEGATIVE NEGATIVE Final    Comment: (NOTE) The Xpert Xpress SARS-CoV-2/FLU/RSV plus assay is intended as an aid in the diagnosis of influenza from Nasopharyngeal swab specimens and should not be used as a sole basis for treatment. Nasal washings and aspirates are unacceptable for Xpert Xpress SARS-CoV-2/FLU/RSV testing.  Fact Sheet for Patients: bloggercourse.com  Fact Sheet for Healthcare Providers: seriousbroker.it  This test is not yet approved or cleared by the [place]  FDA and has been authorized for detection and/or diagnosis of SARS-CoV-2 by FDA under an Emergency Use Authorization (EUA). This EUA will remain in effect (meaning this test can be used) for the duration of the COVID-19 declaration under Section 564(b)(1) of the Act, 21 U.S.C. section 360bbb-3(b)(1), unless the authorization is terminated or revoked.     Resp Syncytial Virus by PCR NEGATIVE NEGATIVE Final    Comment: (NOTE) Fact Sheet for Patients: bloggercourse.com  Fact Sheet for Healthcare Providers: seriousbroker.it  This test is not yet approved or cleared by the [place]  FDA and has been authorized for detection and/or diagnosis of SARS-CoV-2 by FDA under an Emergency Use Authorization (EUA). This EUA will remain in effect (meaning this test can be used) for the duration of the COVID-19 declaration under  Section 564(b)(1) of the Act, 21 U.S.C. section 360bbb-3(b)(1), unless the authorization is terminated or revoked.  Performed at [facility], [address] [place], [place] [address]   Culture, blood (Routine X 2) w Reflex to ID Panel     Status: None (Preliminary result)   Collection Time: 02/16/24  4:12 AM   Specimen: BLOOD RIGHT HAND  Result Value Ref Range Status   Specimen Description BLOOD RIGHT HAND  Final   Special Requests   Final    BOTTLES DRAWN AEROBIC ONLY Blood Culture results may not be optimal due to an inadequate volume of blood received in culture bottles   Culture   Final    NO GROWTH 2 DAYS Performed at [facility], [address], [place], [place] [address]    Report Status PENDING  Incomplete  Culture, blood (Routine X 2) w Reflex to ID Panel     Status: None (Preliminary result)   Collection Time: 02/16/24  4:14 AM   Specimen: BLOOD RIGHT HAND  Result Value Ref Range Status   Specimen Description BLOOD RIGHT HAND  Final   Special Requests   Final    BOTTLES DRAWN AEROBIC ONLY Blood Culture results may not be optimal due to an inadequate volume of blood received in culture bottles   Culture   Final    NO GROWTH 2 DAYS Performed at [facility], [address] [place], [place] [address]    Report Status PENDING  Incomplete  Aerobic/Anaerobic Culture w Gram Stain (surgical/deep wound)     Status: None (Preliminary result)   Collection Time: 02/16/24  2:30 PM   Specimen: Path fluid; Body Fluid  Result Value Ref Range Status   Specimen Description FLUID  Final   Special Requests LEFT THIGH ABSCESS  Final   Gram Stain   Final    ABUNDANT WBC PRESENT, PREDOMINANTLY PMN RARE GRAM NEGATIVE RODS FEW GRAM POSITIVE RODS RARE GRAM POSITIVE COCCI    Culture   Final    MODERATE PROTEUS MIRABILIS CULTURE REINCUBATED FOR BETTER GROWTH Performed at [facility], [address], [place], [place] [address]    Report Status PENDING   Incomplete  Organism ID, Bacteria PROTEUS MIRABILIS  Final      Susceptibility   Proteus mirabilis - MIC*    AMPICILLIN <=2 SENSITIVE Sensitive     CEFAZOLIN  (NON-URINE) 2 SENSITIVE Sensitive     CEFEPIME  <=0.12 SENSITIVE Sensitive     ERTAPENEM <=0.12 SENSITIVE Sensitive     CEFTRIAXONE  <=0.25 SENSITIVE Sensitive     CIPROFLOXACIN  <=0.06 SENSITIVE Sensitive     GENTAMICIN <=1 SENSITIVE Sensitive     MEROPENEM  0.5 SENSITIVE Sensitive     TRIMETH/SULFA <=20 SENSITIVE Sensitive     AMPICILLIN/SULBACTAM <=2 SENSITIVE Sensitive     PIP/TAZO Value in next row Sensitive      <=4 SENSITIVEThis is a modified FDA-approved test that has been validated and its performance characteristics determined by the reporting laboratory.  This laboratory is certified under the Clinical Laboratory Improvement Amendments CLIA as qualified to perform high complexity clinical laboratory testing.    * MODERATE PROTEUS MIRABILIS    Radiology Studies:  VAS US  LOWER EXTREMITY VENOUS (DVT) Result Date: 02/18/2024  Lower Venous DVT Study Patient Name:  TATTIANNA SCHNARR  Date of Exam:   02/17/2024 Medical Rec #: [id number]             Accession #:    [id number] Date of Birth: [birth year]             Patient Gender: F Patient Age:   76 years Exam Location:  [facility] Procedure:      VAS US  LOWER EXTREMITY VENOUS (DVT) Referring Phys: ANAND HONGALGI --------------------------------------------------------------------------------  Indications: Swelling, and Edema.  Limitations: Bandages, open wound and post-op. Comparison Study: Previous study of the bilateral lower extremities on                   1.25.2023. Performing Technologist: Edilia Elden Appl  Examination Guidelines: A complete evaluation includes B-mode imaging, spectral Doppler, color Doppler, and power Doppler as needed of all accessible portions of each vessel. Bilateral testing is considered an integral part of a complete examination. Limited  examinations for reoccurring indications may be performed as noted. The reflux portion of the exam is performed with the patient in reverse Trendelenburg.  +-----+---------------+---------+-----------+----------+--------------+ RIGHTCompressibilityPhasicitySpontaneityPropertiesThrombus Aging +-----+---------------+---------+-----------+----------+--------------+ CFV  Full           Yes      Yes                                 +-----+---------------+---------+-----------+----------+--------------+ SFJ  Full           Yes      Yes                                 +-----+---------------+---------+-----------+----------+--------------+   +---------+---------------+---------+-----------+----------+-------------------+ LEFT     CompressibilityPhasicitySpontaneityPropertiesThrombus Aging      +---------+---------------+---------+-----------+----------+-------------------+ CFV                                                   Not well visualized +---------+---------------+---------+-----------+----------+-------------------+ SFJ  Not well visualized +---------+---------------+---------+-----------+----------+-------------------+ FV Prox                                               Not well visualized +---------+---------------+---------+-----------+----------+-------------------+ FV Mid                  Yes      Yes                                      +---------+---------------+---------+-----------+----------+-------------------+ FV Distal               Yes      Yes                                      +---------+---------------+---------+-----------+----------+-------------------+ PFV                                                   Not well visualized +---------+---------------+---------+-----------+----------+-------------------+ POP                     Yes      Yes                                       +---------+---------------+---------+-----------+----------+-------------------+ PTV                     Yes      Yes                                      +---------+---------------+---------+-----------+----------+-------------------+ PERO                                                  Not well visualized +---------+---------------+---------+-----------+----------+-------------------+ Patient is post thigh abscess drainage, unable to compress due to pain. Gross patency of left lower extremity performed. No obvious signs of deep vein thrombosis noted.    Summary: RIGHT: - No evidence of common femoral vein obstruction.   LEFT: - There is no obvious evidence of deep vein thrombosis in the lower extremity. However, portions of this examination were limited- see technologist comments above.  - No cystic structure found in the popliteal fossa.  *See table(s) above for measurements and observations. Electronically signed by Gaile New MD on 02/18/2024 at 10:50:06 AM.    Final    MRI brain without contrast Result Date: 02/17/2024 EXAM: MRI BRAIN WITHOUT CONTRAST 02/17/2024 03:17:30 PM TECHNIQUE: Multiplanar multisequence MRI of the head/brain was performed without the administration of intravenous contrast. COMPARISON: Head CT 02/14/2024 and 12/15/2022. CLINICAL HISTORY: AMS, high concern for stroke or infection. FINDINGS: The examination is moderately motion degraded. BRAIN AND VENTRICLES: There is no evidence of an acute infarct, intracranial hemorrhage, mass, midline shift, hydrocephalus, or extra-axial fluid collection.  Patchy T2 hyperintensities in the pons are similar to the prior MRI and nonspecific but compatible with chronic small vessel ischemic disease. No age advanced chronic small vessel ischemia is evident in the supratentorial white matter. Cerebral volume is within normal limits for age. Major intracranial vascular flow voids are preserved. ORBITS: Bilateral cataract  extraction. SINUSES AND MASTOIDS: No acute abnormality. BONES AND SOFT TISSUES: Normal marrow signal. No acute soft tissue abnormality. IMPRESSION: 1. No acute intracranial abnormality identified on this motion-degraded examination. 2. Unchanged chronic small vessel ischemia in the pons. Electronically signed by: Dasie Hamburg MD 02/17/2024 03:44 PM EST RP Workstation: [id number]   DG CHEST PORT 1 VIEW Result Date: 02/17/2024 CLINICAL DATA:  Central line placement EXAM: PORTABLE CHEST 1 VIEW COMPARISON:  Three days ago FINDINGS: The heart size and mediastinal contours are within normal limits. Both lungs are clear. The visualized skeletal structures are unremarkable. Stable right internal jugular dialysis catheter. Interval placement of left internal jugular catheter with distal tip in expected position cavoatrial junction. IMPRESSION: Interval placement of left internal jugular catheter with distal tip in expected position of cavoatrial junction. Electronically Signed   By: Lynwood Landy Raddle M.D.   On: 02/17/2024 08:34    Scheduled Meds:    allopurinol   100 mg Oral BID   atorvastatin   10 mg Oral QPM   cholestyramine  light  4 g Oral BID   famotidine   20 mg Oral QAC breakfast   ferric citrate   420 mg Oral TID WC   folic acid   1 mg Oral Daily   heparin   5,000 Units Subcutaneous Q8H   hyoscyamine   0.125 mg Sublingual TID   insulin  aspart  0-6 Units Subcutaneous TID WC   midodrine   10 mg Oral TID   pantoprazole   40 mg Oral Daily   thiamine   100 mg Per Tube Daily    Continuous Infusions:    feeding supplement (OSMOLITE 1.5 CAL) Stopped (02/18/24 0816)   linezolid  (ZYVOX ) IV 600 mg (02/18/24 0837)   meropenem  (MERREM ) IV     potassium PHOSPHATE  IVPB (in mmol) 30 mmol (02/18/24 0842)     LOS: 4 days     Brayton Lye, MD,     To contact the attending provider between 7A-7P or the covering provider during after hours 7P-7A, please log into the web site [email or web address] and access using  universal [place] password for that web site. If you do not have the password, please call the hospital operator.  02/18/2024, 2:23 PM

## 2024-02-18 NOTE — Progress Notes (Signed)
 [facility] Progress Note   Subjective:   Remains somnolent. Having evidence of refeeding syndrome, K phos  ordered today.   Objective Vitals:   02/18/24 0115 02/18/24 0302 02/18/24 0749 02/18/24 1102  BP: 122/60 116/65 117/81 116/73  Pulse: 90   94  Resp: 18   15  Temp: 99 F (37.2 C) 98.2 F (36.8 C) 98.1 F (36.7 C) 99.4 F (37.4 C)  TempSrc: Axillary Axillary Axillary Axillary  SpO2: 100%   100%  Weight: 96.2 kg     Height:       Physical Exam General: sleeping female in NAD, + NG tube Heart: RRR, no murmurs Lungs: CTA anteriorly, respirations unlabored Abdomen: non-distended Extremities: no edema b/l lower extremities Dialysis Access:  [facility]  Additional Objective Labs: Basic Metabolic Panel: Recent Labs  Lab 02/16/24 0412 02/17/24 0257 02/17/24 1415 02/18/24 0321  NA 132* 135 134* 133*  K 3.3* 3.8 3.3* 3.0*  CL 98 99  --  96*  CO2 27 27  --  28  GLUCOSE 116* 103*  --  139*  BUN 14 18  --  8  CREATININE 3.71* 4.76*  --  2.77*  CALCIUM  7.6* 7.9*  --  7.4*  PHOS  --  2.3*  --  1.1*   Liver Function Tests: Recent Labs  Lab 02/14/24 1402 02/15/24 0836 02/17/24 0257 02/18/24 0321  AST 13* 10*  --   --   ALT 10 8  --   --   ALKPHOS 109 87  --   --   BILITOT 0.9 0.9  --   --   PROT 6.3* 4.8*  --   --   ALBUMIN  1.7* <1.5* <1.5* 1.8*   No results for input(s): LIPASE, AMYLASE in the last 168 hours. CBC: Recent Labs  Lab 02/14/24 1402 02/14/24 1413 02/15/24 0836 02/16/24 0412 02/17/24 0257 02/17/24 1415 02/18/24 0321  WBC 39.9*  --  35.0* 28.8* 31.2*  --  24.6*  NEUTROABS  --   --  29.8*  --   --   --   --   HGB 9.8*   < > 8.1* 8.3* 7.6* 8.2* 7.8*  HCT 31.4*   < > 25.3* 25.4* 23.5* 24.0* 23.7*  MCV 92.9  --  92.7 88.8 89.4  --  86.2  PLT 212  --  183 166 178  --  173   < > = values in this interval not displayed.   Blood Culture    Component Value Date/Time   SDES FLUID 02/16/2024 1430   SPECREQUEST LEFT THIGH ABSCESS  02/16/2024 1430   CULT  02/16/2024 1430    MODERATE PROTEUS MIRABILIS CULTURE REINCUBATED FOR BETTER GROWTH Performed at [facility], [address] [place], [place] [address]    REPTSTATUS PENDING 02/16/2024 1430    Cardiac Enzymes: No results for input(s): CKTOTAL, CKMB, CKMBINDEX, TROPONINI in the last 168 hours. CBG: Recent Labs  Lab 02/17/24 1619 02/17/24 1955 02/18/24 0227 02/18/24 0747 02/18/24 1104  GLUCAP 97 113* 124* 185* 190*   Iron  Studies: No results for input(s): IRON , TIBC, TRANSFERRIN, FERRITIN in the last 72 hours. @lablastinr3 @ Studies/Results: VAS US  LOWER EXTREMITY VENOUS (DVT) Result Date: 02/18/2024  Lower Venous DVT Study Patient Name:  TESLA KEELER  Date of Exam:   02/17/2024 Medical Rec #: [id number]             Accession #:    [id number] Date of Birth: [birth year]  Patient Gender: F Patient Age:  58 years Exam Location:  [facility] Procedure:      VAS US  LOWER EXTREMITY VENOUS (DVT) Referring Phys: ANAND HONGALGI --------------------------------------------------------------------------------  Indications: Swelling, and Edema.  Limitations: Bandages, open wound and post-op. Comparison Study: Previous study of the bilateral lower extremities on                   1.25.2023. Performing Technologist: Edilia Elden Appl  Examination Guidelines: A complete evaluation includes B-mode imaging, spectral Doppler, color Doppler, and power Doppler as needed of all accessible portions of each vessel. Bilateral testing is considered an integral part of a complete examination. Limited examinations for reoccurring indications may be performed as noted. The reflux portion of the exam is performed with the patient in reverse Trendelenburg.  +-----+---------------+---------+-----------+----------+--------------+ RIGHTCompressibilityPhasicitySpontaneityPropertiesThrombus Aging  +-----+---------------+---------+-----------+----------+--------------+ CFV  Full           Yes      Yes                                 +-----+---------------+---------+-----------+----------+--------------+ SFJ  Full           Yes      Yes                                 +-----+---------------+---------+-----------+----------+--------------+   +---------+---------------+---------+-----------+----------+-------------------+ LEFT     CompressibilityPhasicitySpontaneityPropertiesThrombus Aging      +---------+---------------+---------+-----------+----------+-------------------+ CFV                                                   Not well visualized +---------+---------------+---------+-----------+----------+-------------------+ SFJ                                                   Not well visualized +---------+---------------+---------+-----------+----------+-------------------+ FV Prox                                               Not well visualized +---------+---------------+---------+-----------+----------+-------------------+ FV Mid                  Yes      Yes                                      +---------+---------------+---------+-----------+----------+-------------------+ FV Distal               Yes      Yes                                      +---------+---------------+---------+-----------+----------+-------------------+ PFV  Not well visualized +---------+---------------+---------+-----------+----------+-------------------+ POP                     Yes      Yes                                      +---------+---------------+---------+-----------+----------+-------------------+ PTV                     Yes      Yes                                      +---------+---------------+---------+-----------+----------+-------------------+ PERO                                                   Not well visualized +---------+---------------+---------+-----------+----------+-------------------+ Patient is post thigh abscess drainage, unable to compress due to pain. Gross patency of left lower extremity performed. No obvious signs of deep vein thrombosis noted.    Summary: RIGHT: - No evidence of common femoral vein obstruction.   LEFT: - There is no obvious evidence of deep vein thrombosis in the lower extremity. However, portions of this examination were limited- see technologist comments above.  - No cystic structure found in the popliteal fossa.  *See table(s) above for measurements and observations. Electronically signed by Gaile New MD on 02/18/2024 at 10:50:06 AM.    Final    MRI brain without contrast Result Date: 02/17/2024 EXAM: MRI BRAIN WITHOUT CONTRAST 02/17/2024 03:17:30 PM TECHNIQUE: Multiplanar multisequence MRI of the head/brain was performed without the administration of intravenous contrast. COMPARISON: Head CT 02/14/2024 and 12/15/2022. CLINICAL HISTORY: AMS, high concern for stroke or infection. FINDINGS: The examination is moderately motion degraded. BRAIN AND VENTRICLES: There is no evidence of an acute infarct, intracranial hemorrhage, mass, midline shift, hydrocephalus, or extra-axial fluid collection. Patchy T2 hyperintensities in the pons are similar to the prior MRI and nonspecific but compatible with chronic small vessel ischemic disease. No age advanced chronic small vessel ischemia is evident in the supratentorial white matter. Cerebral volume is within normal limits for age. Major intracranial vascular flow voids are preserved. ORBITS: Bilateral cataract extraction. SINUSES AND MASTOIDS: No acute abnormality. BONES AND SOFT TISSUES: Normal marrow signal. No acute soft tissue abnormality. IMPRESSION: 1. No acute intracranial abnormality identified on this motion-degraded examination. 2. Unchanged chronic small vessel ischemia in the pons. Electronically  signed by: Dasie Hamburg MD 02/17/2024 03:44 PM EST RP Workstation: [id number]   DG CHEST PORT 1 VIEW Result Date: 02/17/2024 CLINICAL DATA:  Central line placement EXAM: PORTABLE CHEST 1 VIEW COMPARISON:  Three days ago FINDINGS: The heart size and mediastinal contours are within normal limits. Both lungs are clear. The visualized skeletal structures are unremarkable. Stable right internal jugular dialysis catheter. Interval placement of left internal jugular catheter with distal tip in expected position cavoatrial junction. IMPRESSION: Interval placement of left internal jugular catheter with distal tip in expected position of cavoatrial junction. Electronically Signed   By: Lynwood Landy Raddle M.D.   On: 02/17/2024 08:34   Medications:  feeding supplement (OSMOLITE 1.5 CAL) Stopped (02/18/24 0816)   linezolid  (ZYVOX ) IV 600 mg (02/18/24 0837)   meropenem  (MERREM )  IV     potassium PHOSPHATE  IVPB (in mmol) 30 mmol (02/18/24 0842)    allopurinol   100 mg Oral BID   atorvastatin   10 mg Oral QPM   cholestyramine  light  4 g Oral BID   famotidine   20 mg Oral QAC breakfast   ferric citrate   420 mg Oral TID WC   folic acid   1 mg Oral Daily   heparin   5,000 Units Subcutaneous Q8H   hyoscyamine   0.125 mg Sublingual TID   insulin  aspart  0-6 Units Subcutaneous TID WC   midodrine   10 mg Oral TID   pantoprazole   40 mg Oral Daily   thiamine   100 mg Per Tube Daily    Dialysis Orders:  [facility] 4:00 400BFR EDW 96.1 kg 3K/2.5Ca TDC No bolus heparin    Assessment/Plan: AMS/ possible sepsis: WBC remain elevated, on antibiotics per primary team. Changed from cefepime  to meropenem .  L thigh abscess. Surgery consulted, s/p I&D on 02/16/24 ESRD: on HD TTS. HD on regular schedule today Hypotension.  Has chronic low BP's takes midodrine  pre HD three times per week. Continue here.  Volume: bp's soft, no pitting edema. No UF with HD today Anemia. Hgb 7.8.  Mircera 225  given 12/6 at OP clinic, not due for next  dose yet.  Hypokalemia/hypophos: After tube feeds initiated, receiving supplementation.   Lucie Collet, PA-C 02/18/2024, 12:13 PM  [facility] Pager: [phone]

## 2024-02-18 NOTE — Progress Notes (Signed)
 Mb arrived to room to perform EEG and Pt has just left for HD and will not be available utill later this afternoon, early evening Per Nurse. Will f/u need and availability as schedule permits.

## 2024-02-18 NOTE — Progress Notes (Signed)
 Progress Note  2 Days Post-Op  Subjective: Patient had had bowel movement upon entering room. Nursing staff present to change bedding and wound dressing.  Patient somnolent but arouses to stimuli/voices.  ROS  All negative with the exception of above.  Objective: Vital signs in last 24 hours: Temp:  [97.7 F (36.5 C)-99 F (37.2 C)] 98.1 F (36.7 C) (12/13 0749) Pulse Rate:  [89-96] 90 (12/13 0115) Resp:  [12-20] 18 (12/13 0115) BP: (99-124)/(51-92) 117/81 (12/13 0749) SpO2:  [100 %] 100 % (12/13 0115) Weight:  [96.2 kg] 96.2 kg (12/13 0115) Last BM Date :  (PTA)  Intake/Output from previous day: 12/12 0701 - 12/13 0700 In: 635 [Blood:635] Out: 500 [Stool:500] Intake/Output this shift: No intake/output data recorded.  PE: General: Somnolent female who is laying in bed in NAD. HEENT: Head is normocephalic, atraumatic.  Heart:  Normal HR during encounter.  Abd: Soft, NT, ND. No rebound tenderness or guarding.  Skin: Dressing covering left groin/thigh region. Stool noted on bedding.    Lab Results:  Recent Labs    02/17/24 0257 02/17/24 1415 02/18/24 0321  WBC 31.2*  --  24.6*  HGB 7.6* 8.2* 7.8*  HCT 23.5* 24.0* 23.7*  PLT 178  --  173   BMET Recent Labs    02/17/24 0257 02/17/24 1415 02/18/24 0321  NA 135 134* 133*  K 3.8 3.3* 3.0*  CL 99  --  96*  CO2 27  --  28  GLUCOSE 103*  --  139*  BUN 18  --  8  CREATININE 4.76*  --  2.77*  CALCIUM  7.9*  --  7.4*   PT/INR No results for input(s): LABPROT, INR in the last 72 hours. CMP     Component Value Date/Time   NA 133 (L) 02/18/2024 0321   NA 137 09/20/2019 1530   K 3.0 (L) 02/18/2024 0321   CL 96 (L) 02/18/2024 0321   CO2 28 02/18/2024 0321   GLUCOSE 139 (H) 02/18/2024 0321   BUN 8 02/18/2024 0321   BUN 18 09/20/2019 1530   CREATININE 2.77 (H) 02/18/2024 0321   CALCIUM  7.4 (L) 02/18/2024 0321   PROT 4.8 (L) 02/15/2024 0836   ALBUMIN  1.8 (L) 02/18/2024 0321   AST 10 (L) 02/15/2024  0836   ALT 8 02/15/2024 0836   ALKPHOS 87 02/15/2024 0836   BILITOT 0.9 02/15/2024 0836   GFRNONAA 19 (L) 02/18/2024 0321   GFRAA 49 (L) 11/30/2019 0356   Lipase     Component Value Date/Time   LIPASE 24 06/03/2023 0905       Studies/Results: VAS US  LOWER EXTREMITY VENOUS (DVT) Result Date: 02/18/2024  Lower Venous DVT Study Patient Name:  Deborah [name]  Date of Exam:   02/17/2024 Medical Rec #: [id number]             Accession #:    [id number] Date of Birth: 1965/08/23             Patient Gender: F Patient Age:   6 years Exam Location:  [facility] Procedure:      VAS US  LOWER EXTREMITY VENOUS (DVT) Referring Phys: ANAND HONGALGI --------------------------------------------------------------------------------  Indications: Swelling, and Edema.  Limitations: Bandages, open wound and post-op. Comparison Study: Previous study of the bilateral lower extremities on                   1.25.2023. Performing Technologist: Edilia Elden Appl  Examination Guidelines: A complete evaluation includes B-mode imaging, spectral Doppler,  color Doppler, and power Doppler as needed of all accessible portions of each vessel. Bilateral testing is considered an integral part of a complete examination. Limited examinations for reoccurring indications may be performed as noted. The reflux portion of the exam is performed with the patient in reverse Trendelenburg.  +-----+---------------+---------+-----------+----------+--------------+ RIGHTCompressibilityPhasicitySpontaneityPropertiesThrombus Aging +-----+---------------+---------+-----------+----------+--------------+ CFV  Full           Yes      Yes                                 +-----+---------------+---------+-----------+----------+--------------+ SFJ  Full           Yes      Yes                                 +-----+---------------+---------+-----------+----------+--------------+    +---------+---------------+---------+-----------+----------+-------------------+ LEFT     CompressibilityPhasicitySpontaneityPropertiesThrombus Aging      +---------+---------------+---------+-----------+----------+-------------------+ CFV                                                   Not well visualized +---------+---------------+---------+-----------+----------+-------------------+ SFJ                                                   Not well visualized +---------+---------------+---------+-----------+----------+-------------------+ FV Prox                                               Not well visualized +---------+---------------+---------+-----------+----------+-------------------+ FV Mid                  Yes      Yes                                      +---------+---------------+---------+-----------+----------+-------------------+ FV Distal               Yes      Yes                                      +---------+---------------+---------+-----------+----------+-------------------+ PFV                                                   Not well visualized +---------+---------------+---------+-----------+----------+-------------------+ POP                     Yes      Yes                                      +---------+---------------+---------+-----------+----------+-------------------+ PTV  Yes      Yes                                      +---------+---------------+---------+-----------+----------+-------------------+ PERO                                                  Not well visualized +---------+---------------+---------+-----------+----------+-------------------+ Patient is post thigh abscess drainage, unable to compress due to pain. Gross patency of left lower extremity performed. No obvious signs of deep vein thrombosis noted.    Summary: RIGHT: - No evidence of common femoral vein obstruction.   LEFT: -  There is no obvious evidence of deep vein thrombosis in the lower extremity. However, portions of this examination were limited- see technologist comments above.  - No cystic structure found in the popliteal fossa.  *See table(s) above for measurements and observations. Electronically signed by Gaile New MD on 02/18/2024 at 10:50:06 AM.    Final    MRI brain without contrast Result Date: 02/17/2024 EXAM: MRI BRAIN WITHOUT CONTRAST 02/17/2024 03:17:30 PM TECHNIQUE: Multiplanar multisequence MRI of the head/brain was performed without the administration of intravenous contrast. COMPARISON: Head CT 02/14/2024 and 12/15/2022. CLINICAL HISTORY: AMS, high concern for stroke or infection. FINDINGS: The examination is moderately motion degraded. BRAIN AND VENTRICLES: There is no evidence of an acute infarct, intracranial hemorrhage, mass, midline shift, hydrocephalus, or extra-axial fluid collection. Patchy T2 hyperintensities in the pons are similar to the prior MRI and nonspecific but compatible with chronic small vessel ischemic disease. No age advanced chronic small vessel ischemia is evident in the supratentorial white matter. Cerebral volume is within normal limits for age. Major intracranial vascular flow voids are preserved. ORBITS: Bilateral cataract extraction. SINUSES AND MASTOIDS: No acute abnormality. BONES AND SOFT TISSUES: Normal marrow signal. No acute soft tissue abnormality. IMPRESSION: 1. No acute intracranial abnormality identified on this motion-degraded examination. 2. Unchanged chronic small vessel ischemia in the pons. Electronically signed by: Dasie Hamburg MD 02/17/2024 03:44 PM EST RP Workstation: [id number]   DG CHEST PORT 1 VIEW Result Date: 02/17/2024 CLINICAL DATA:  Central line placement EXAM: PORTABLE CHEST 1 VIEW COMPARISON:  Three days ago FINDINGS: The heart size and mediastinal contours are within normal limits. Both lungs are clear. The visualized skeletal structures are  unremarkable. Stable right internal jugular dialysis catheter. Interval placement of left internal jugular catheter with distal tip in expected position cavoatrial junction. IMPRESSION: Interval placement of left internal jugular catheter with distal tip in expected position of cavoatrial junction. Electronically Signed   By: Lynwood Landy Raddle M.D.   On: 02/17/2024 08:34    Anti-infectives: Anti-infectives (From admission, onward)    Start     Dose/Rate Route Frequency Ordered Stop   02/18/24 1800  meropenem  (MERREM ) 500 mg in sodium chloride  0.9 % 100 mL IVPB        500 mg 200 mL/hr over 30 Minutes Intravenous Every 24 hours 02/17/24 0941     02/17/24 1030  meropenem  (MERREM ) 500 mg in sodium chloride  0.9 % 100 mL IVPB        500 mg 200 mL/hr over 30 Minutes Intravenous  Once 02/17/24 0941 02/17/24 1059   02/17/24 1000  meropenem  (MERREM ) 500 mg in sodium chloride  0.9 % 100 mL IVPB  Status:  Discontinued        500 mg 200 mL/hr over 30 Minutes Intravenous Every 24 hours 02/16/24 1045 02/17/24 0941   02/16/24 1145  linezolid  (ZYVOX ) IVPB 600 mg        600 mg 300 mL/hr over 60 Minutes Intravenous Every 12 hours 02/16/24 1045     02/16/24 1145  meropenem  (MERREM ) 500 mg in sodium chloride  0.9 % 100 mL IVPB  Status:  Discontinued        500 mg 200 mL/hr over 30 Minutes Intravenous  Once 02/16/24 1045 02/17/24 0941   02/15/24 2000  ceFEPIme  (MAXIPIME ) 1 g in sodium chloride  0.9 % 100 mL IVPB  Status:  Discontinued        1 g 200 mL/hr over 30 Minutes Intravenous Every 24 hours 02/14/24 1649 02/16/24 1045   02/15/24 1700  vancomycin  (VANCOCIN ) IVPB 1000 mg/200 mL premix        1,000 mg 200 mL/hr over 60 Minutes Intravenous  Once 02/15/24 1528 02/15/24 2015   02/15/24 1415  vancomycin  variable dose per unstable renal function (pharmacist dosing)  Status:  Discontinued         Does not apply See admin instructions 02/15/24 1415 02/16/24 1045   02/15/24 0600  metroNIDAZOLE  (FLAGYL ) IVPB 500 mg   Status:  Discontinued        500 mg 100 mL/hr over 60 Minutes Intravenous Every 12 hours 02/14/24 2305 02/16/24 1046   02/14/24 1445  aztreonam  (AZACTAM ) 2 g in sodium chloride  0.9 % 100 mL IVPB  Status:  Discontinued        2 g 200 mL/hr over 30 Minutes Intravenous  Once 02/14/24 1435 02/14/24 1440   02/14/24 1445  metroNIDAZOLE  (FLAGYL ) IVPB 500 mg        500 mg 100 mL/hr over 60 Minutes Intravenous  Once 02/14/24 1435 02/14/24 1844   02/14/24 1445  vancomycin  (VANCOCIN ) IVPB 1000 mg/200 mL premix  Status:  Discontinued        1,000 mg 200 mL/hr over 60 Minutes Intravenous  Once 02/14/24 1435 02/14/24 1444   02/14/24 1445  ceFEPIme  (MAXIPIME ) 2 g in sodium chloride  0.9 % 100 mL IVPB        2 g 200 mL/hr over 30 Minutes Intravenous  Once 02/14/24 1444 02/14/24 1606   02/14/24 1445  vancomycin  (VANCOREADY) IVPB 2000 mg/400 mL        2,000 mg 200 mL/hr over 120 Minutes Intravenous  Once 02/14/24 1444 02/14/24 1848        Assessment/Plan Left thigh abscess POD2: S/P I&D 12/11 Dr. Vernetta - Afebrile. - WBC improved to 24.6 from 31.2; HGB 7.8 from 8.2. Continue to monitor - Nursing staff to take photo in media as they are changing bedding currently. - - Continue twice daily moist-to-dry dressing changes or when soiled. - continue IV abx and follow cultures; Cultures pending Proteus mirabilis so far. - Will continue to follow.    FEN carb mod ID: merrem , linezolid  VTE: SQH   Encephalopathy ESRD on HD Anemia of ESRD Chronic hypotension DM2 HLD Afib - in sinus here Gout Depression/anxiety    LOS: 4 days   I reviewed nursing notes, specialist notes, consulting provider notes, hospitalist notes, last 24 h vitals and pain scores, last 48 h intake and output, last 24 h labs and trends, and last 24 h imaging results.   Marjorie Carlyon Favre, [facility] 02/18/2024, 11:15 AM Please see Amion for pager number during day  hours 7:00am-4:30pm

## 2024-02-18 NOTE — Progress Notes (Signed)
 Ordering provider made aware that since patient is still in dialysis at this time and there is no EEG coverage overnight that the earliest the EEG will be completed is tomorrow morning.

## 2024-02-18 NOTE — Progress Notes (Signed)
°   02/18/24 1703  Vitals  Temp 97.8 F (36.6 C)  Temp Source Oral  BP (!) 105/58  MAP (mmHg) 73  BP Location Right Arm  BP Method Automatic  Patient Position (if appropriate) Lying  Pulse Rate 96  ECG Heart Rate 96  Resp 11  Weight 95.5 kg  Type of Weight Post-Dialysis  Oxygen Therapy  SpO2 100 %  O2 Device Room Air  Patient Activity (if Appropriate) In bed  Pulse Oximetry Type Continuous  Oximetry Probe Site Changed No  During Treatment Monitoring  HD Safety Checks Performed Yes  Intra-Hemodialysis Comments Tx completed;Tolerated well  Post Treatment  Dialyzer Clearance Heavily streaked  Liters Processed 95.9  Fluid Removed (mL) 0 mL  Tolerated HD Treatment Yes  Hemodialysis Catheter Right Subclavian Double lumen Permanent (Tunneled)  Placement Date/Time: 06/24/22 1644   Serial / Lot #: [id number]  Expiration Date: 09/08/26  Time Out: Correct patient;Correct site;Correct procedure  Maximum sterile barrier precautions: Hand hygiene;Mask;Cap;Sterile gloves;Sterile gown;Large sterile ...  Red Lumen Status Heparin  locked;Antimicrobial dead end cap  Catheter fill solution Heparin  1000 units/ml  Catheter fill volume (Arterial) 1.9 cc  Catheter fill volume (Venous) 1.9  Dressing Type Transparent  Dressing Status Antimicrobial disc/dressing in place;Clean, Dry, Intact  Drainage Description None  Dressing Change Due 02/22/24  Post treatment catheter status Capped and Clamped

## 2024-02-18 NOTE — Procedures (Signed)
 Patient Name: Deborah [name]  MRN: [id number]  Epilepsy Attending: Arlin MALVA Krebs  Referring Physician/Provider: Sherlon Brayton RAMAN, MD  Date: 02/19/2024 Duration: 23.54 mins  Patient history: 58yo F with ams. EEG to evaluate for seizure  Level of alertness: Awake  AEDs during EEG study: None  Technical aspects: This EEG study was done with scalp electrodes positioned according to the 10-20 International system of electrode placement. Electrical activity was reviewed with band pass filter of 1-70Hz , sensitivity of 7 uV/mm, display speed of 15mm/sec with a 60Hz  notched filter applied as appropriate. EEG data were recorded continuously and digitally stored.  Video monitoring was available and reviewed as appropriate.  Description: EEG showed continuous generalized polymorphic sharply contoured 3 to 6 Hz theta-delta slowing. Hyperventilation and photic stimulation were not performed.     ABNORMALITY - Continuous slow, generalized  IMPRESSION: This study is suggestive of generalized cerebral dysfunction ( encephalopathy). No seizures or epileptiform discharges were seen throughout the recording.   Galina Haddox O Addley Ballinger

## 2024-02-18 NOTE — Progress Notes (Signed)
 Received patient in bed to unit.  Alert and oriented.  Informed consent signed and in chart.   TX duration: 3 patient will have dialysis on tomorrow, may run 3 hours tonight per CN  Patient tolerated well.  Transported back to the room  Alert, without acute distress.  Hand-off given to patient's nurse.   Access used: Dialysis Catheter Access issues: None  Total UF removed: 0 Medication(s) given: None Post HD VS: T99-HR90-B/P122/60 Post HD weight: 96.2kg  Neville Seip, RN [facility]   02/18/24 0115  Vitals  Temp 99 F (37.2 C)  Temp Source Axillary  BP 122/60  BP Location Right Wrist  BP Method Automatic  Patient Position (if appropriate) Lying  Pulse Rate 90  Pulse Rate Source Monitor  ECG Heart Rate 92  Resp 18  Weight 96.2 kg  Type of Weight Post-Dialysis  Oxygen Therapy  SpO2 100 %  O2 Device Room Air  Patient Activity (if Appropriate) In bed  Pulse Oximetry Type Continuous  During Treatment Monitoring  Blood Flow Rate (mL/min) 0 mL/min  Arterial Pressure (mmHg) -22.82 mmHg  Venous Pressure (mmHg) 31.31 mmHg  TMP (mmHg) 3.03 mmHg  Ultrafiltration Rate (mL/min) 301 mL/min  Dialysate Flow Rate (mL/min) 299 ml/min  Dialysate Potassium Concentration 3  Dialysate Calcium  Concentration 2.5  Duration of HD Treatment -hour(s) 3 hour(s)  Cumulative Fluid Removed (mL) per Treatment  0.04  HD Safety Checks Performed Yes  Intra-Hemodialysis Comments Tolerated well;Tx completed  Post Treatment  Dialyzer Clearance Clear  Liters Processed 72  Fluid Removed (mL) 0 mL  Tolerated HD Treatment Yes  Note  Patient Observations patient withdrawn  Hemodialysis Catheter Right Subclavian Double lumen Permanent (Tunneled)  Placement Date/Time: 06/24/22 1644   Serial / Lot #: [id number]  Expiration Date: 09/08/26  Time Out: Correct patient;Correct site;Correct procedure  Maximum sterile barrier precautions: Hand hygiene;Mask;Cap;Sterile gloves;Sterile gown;Large  sterile ...  Site Condition No complications  Blue Lumen Status Flushed;Antimicrobial dead end cap;Heparin  locked  Red Lumen Status Flushed;Antimicrobial dead end cap;Heparin  locked  Purple Lumen Status N/A  Catheter fill solution Heparin  1000 units/ml  Catheter fill volume (Arterial) 1.9 cc  Catheter fill volume (Venous) 1.9  Dressing Type Transparent  Dressing Status Antimicrobial disc/dressing in place;Clean, Dry, Intact  Drainage Description None  Dressing Change Due 02/22/24  Post treatment catheter status Capped and Clamped

## 2024-02-19 ENCOUNTER — Inpatient Hospital Stay (HOSPITAL_COMMUNITY)

## 2024-02-19 DIAGNOSIS — R4182 Altered mental status, unspecified: Secondary | ICD-10-CM | POA: Diagnosis not present

## 2024-02-19 DIAGNOSIS — R569 Unspecified convulsions: Secondary | ICD-10-CM

## 2024-02-19 LAB — RENAL FUNCTION PANEL
Albumin: 1.8 g/dL — ABNORMAL LOW (ref 3.5–5.0)
Albumin: 1.7 g/dL — ABNORMAL LOW (ref 3.5–5.0)
Anion gap: 9 (ref 5–15)
Anion gap: 8 (ref 5–15)
BUN: <5 mg/dL — ABNORMAL LOW (ref 6–20)
BUN: 8 mg/dL (ref 6–20)
CO2: 28 mmol/L (ref 22–32)
CO2: 26 mmol/L (ref 22–32)
Calcium: 7.1 mg/dL — ABNORMAL LOW (ref 8.9–10.3)
Calcium: 7.4 mg/dL — ABNORMAL LOW (ref 8.9–10.3)
Chloride: 96 mmol/L — ABNORMAL LOW (ref 98–111)
Chloride: 102 mmol/L (ref 98–111)
Creatinine, Ser: 1.88 mg/dL — ABNORMAL HIGH (ref 0.44–1.00)
Creatinine, Ser: 2.55 mg/dL — ABNORMAL HIGH (ref 0.44–1.00)
GFR, Estimated: 30 mL/min — ABNORMAL LOW (ref >60)
GFR, Estimated: 21 mL/min — ABNORMAL LOW (ref >60)
Glucose, Bld: 182 mg/dL — ABNORMAL HIGH (ref 70–99)
Glucose, Bld: 195 mg/dL — ABNORMAL HIGH (ref 70–99)
Phosphorus: 3.1 mg/dL (ref 2.5–4.6)
Phosphorus: 2.5 mg/dL (ref 2.5–4.6)
Potassium: 3.4 mmol/L — ABNORMAL LOW (ref 3.5–5.1)
Potassium: 3.7 mmol/L (ref 3.5–5.1)
Sodium: 133 mmol/L — ABNORMAL LOW (ref 135–145)
Sodium: 136 mmol/L (ref 135–145)

## 2024-02-19 LAB — GLUCOSE, CAPILLARY
Glucose-Capillary: 171 mg/dL — ABNORMAL HIGH (ref 70–99)
Glucose-Capillary: 179 mg/dL — ABNORMAL HIGH (ref 70–99)
Glucose-Capillary: 180 mg/dL — ABNORMAL HIGH (ref 70–99)
Glucose-Capillary: 190 mg/dL — ABNORMAL HIGH (ref 70–99)
Glucose-Capillary: 190 mg/dL — ABNORMAL HIGH (ref 70–99)
Glucose-Capillary: 209 mg/dL — ABNORMAL HIGH (ref 70–99)
Glucose-Capillary: 211 mg/dL — ABNORMAL HIGH (ref 70–99)

## 2024-02-19 LAB — CBC
HCT: 23.2 % — ABNORMAL LOW (ref 36.0–46.0)
Hemoglobin: 7.5 g/dL — ABNORMAL LOW (ref 12.0–15.0)
MCH: 27.9 pg (ref 26.0–34.0)
MCHC: 32.3 g/dL (ref 30.0–36.0)
MCV: 86.2 fL (ref 80.0–100.0)
Platelets: 182 K/uL (ref 150–400)
RBC: 2.69 MIL/uL — ABNORMAL LOW (ref 3.87–5.11)
RDW: 16.1 % — ABNORMAL HIGH (ref 11.5–15.5)
WBC: 18.5 K/uL — ABNORMAL HIGH (ref 4.0–10.5)
nRBC: 0 % (ref 0.0–0.2)

## 2024-02-19 LAB — CULTURE, BLOOD (SINGLE): Culture: NO GROWTH

## 2024-02-19 LAB — MAGNESIUM: Magnesium: 1.9 mg/dL (ref 1.7–2.4)

## 2024-02-19 MED ORDER — MAGNESIUM SULFATE 2 GM/50ML IV SOLN
2.0000 g | Freq: Once | INTRAVENOUS | Status: AC
  Administered 2024-02-19: 2 g via INTRAVENOUS
  Filled 2024-02-19: qty 50

## 2024-02-19 MED ORDER — ALTEPLASE 2 MG IJ SOLR
2.0000 mg | Freq: Once | INTRAMUSCULAR | Status: AC
  Administered 2024-02-19: 2 mg
  Filled 2024-02-19: qty 2

## 2024-02-19 MED ORDER — HYDROCERIN EX CREA
1.0000 | TOPICAL_CREAM | Freq: Two times a day (BID) | CUTANEOUS | Status: DC
  Administered 2024-02-19 – 2024-02-24 (×9): 1 via TOPICAL
  Filled 2024-02-19: qty 113

## 2024-02-19 MED ORDER — K PHOS MONO-SOD PHOS DI & MONO 155-852-130 MG PO TABS
500.0000 mg | ORAL_TABLET | Freq: Once | ORAL | Status: AC
  Administered 2024-02-19: 500 mg via NASOGASTRIC
  Filled 2024-02-19: qty 2

## 2024-02-19 MED ORDER — K PHOS MONO-SOD PHOS DI & MONO 155-852-130 MG PO TABS
500.0000 mg | ORAL_TABLET | ORAL | Status: AC
  Administered 2024-02-19 (×2): 500 mg
  Filled 2024-02-19 (×2): qty 2

## 2024-02-19 MED ORDER — POTASSIUM CHLORIDE 20 MEQ PO PACK
20.0000 meq | PACK | Freq: Once | ORAL | Status: AC
  Administered 2024-02-19: 20 meq
  Filled 2024-02-19: qty 1

## 2024-02-19 NOTE — Consult Note (Signed)
 WOC Nurse Consult Note: followed by Dr. Harden for [name]; last seen in 09/2023 stable eschar no wound care needed  Reason for Consult: L thigh and L heel wounds  Wound type: 1 . Full thickness surgical wound L thigh/groin underwent I&D by Dr. Vernetta 12/11; being followed by surgery with orders in place for twice daily NS WTD dressings  2.  L heel eschar; long history of stable unstageable Pressure Injury heel in setting of PVD  Pressure Injury POA: yes, heel  Measurement: see nursing flowsheet  Wound bed: dry eschar to L heel  Drainage (amount, consistency, odor) dry  Periwound: peeling dry skin  Dressing procedure/placement/frequency: Cleanse L foot/heel with soap and water, dry and paint black eschar with Betadine  daily, allow to air dry.  Cover with silicone foam heel protectors.    SURGERY MANAGING L THIGH WOUND.   POC discussed with betadine  nurse. WOC team will not follow.  Reconsult if further needs arise.   Thank you,    Powell Bar MSN, RN-BC, [employer]

## 2024-02-19 NOTE — Plan of Care (Signed)
  Problem: Education: Goal: Ability to describe self-care measures that may prevent or decrease complications (Diabetes Survival Skills Education) will improve Outcome: Progressing Goal: Individualized Educational Video(s) Outcome: Progressing   Problem: Coping: Goal: Ability to adjust to condition or change in health will improve Outcome: Progressing   Problem: Fluid Volume: Goal: Ability to maintain a balanced intake and output will improve Outcome: Progressing   Problem: Health Behavior/Discharge Planning: Goal: Ability to identify and utilize available resources and services will improve Outcome: Progressing Goal: Ability to manage health-related needs will improve Outcome: Progressing   Problem: Metabolic: Goal: Ability to maintain appropriate glucose levels will improve Outcome: Progressing   Problem: Nutritional: Goal: Maintenance of adequate nutrition will improve Outcome: Progressing Goal: Progress toward achieving an optimal weight will improve Outcome: Progressing   Problem: Skin Integrity: Goal: Risk for impaired skin integrity will decrease Outcome: Progressing   Problem: Tissue Perfusion: Goal: Adequacy of tissue perfusion will improve Outcome: Progressing   Problem: Fluid Volume: Goal: Hemodynamic stability will improve Outcome: Progressing   Problem: Clinical Measurements: Goal: Diagnostic test results will improve Outcome: Progressing Goal: Signs and symptoms of infection will decrease Outcome: Progressing   Problem: Respiratory: Goal: Ability to maintain adequate ventilation will improve Outcome: Progressing   Problem: Education: Goal: Knowledge of General Education information will improve Description: Including pain rating scale, medication(s)/side effects and non-pharmacologic comfort measures Outcome: Progressing   Problem: Health Behavior/Discharge Planning: Goal: Ability to manage health-related needs will improve Outcome:  Progressing   Problem: Clinical Measurements: Goal: Ability to maintain clinical measurements within normal limits will improve Outcome: Progressing Goal: Will remain free from infection Outcome: Progressing Goal: Diagnostic test results will improve Outcome: Progressing Goal: Respiratory complications will improve Outcome: Progressing Goal: Cardiovascular complication will be avoided Outcome: Progressing   Problem: Activity: Goal: Risk for activity intolerance will decrease Outcome: Progressing   Problem: Nutrition: Goal: Adequate nutrition will be maintained Outcome: Progressing   Problem: Coping: Goal: Level of anxiety will decrease Outcome: Progressing   Problem: Elimination: Goal: Will not experience complications related to bowel motility Outcome: Progressing Goal: Will not experience complications related to urinary retention Outcome: Progressing   Problem: Pain Managment: Goal: General experience of comfort will improve and/or be controlled Outcome: Progressing   Problem: Safety: Goal: Ability to remain free from injury will improve Outcome: Progressing   Problem: Skin Integrity: Goal: Risk for impaired skin integrity will decrease Outcome: Progressing

## 2024-02-19 NOTE — Progress Notes (Signed)
 PROGRESS NOTE   Deborah [name]  FMW:[id number]    DOB: 1965/09/30    DOA: 02/14/2024  PCP: Pcp, No   I have briefly reviewed patients previous medical records in [facility].   Brief Hospital Course:   58 year old female with medical history significant for ESRD on HD, paroxysmal A-fib, DM2, chronic pain, HLD, R BKA, presented to the ED via EMS from her dialysis center with altered mental status, lethargy, alert and oriented x 1, slightly hypotensive and tachycardic, reportedly did not get dialysis, had left-sided deficits from prior stroke.  Admitted for acute metabolic encephalopathy of unclear etiology, remained altered 12/10.  Workup significant for severe sepsis, she was found to have significant left thigh abscess, went to the OR 12/11.   Assessment & Plan:   Acute metabolic encephalopathy - Due to sepsis -CT head with no acute findings -MRI brain with no acute finding - avoid cefepime , now on meropenem  - Ammonia within normal limit - EEG with no seizure activities - Continue with thiamine  - Unsafe to swallow yet, continue with tube feeds - Initial VBG significant for CO2 retention and acidosis, but appears to be an error as repeat ABG no CO2 retention, PCCM input greatly appreciated  Severe sepsis, left thigh - due to thigh, left groin abscess -The abdomen/pelvis and left thigh area noted. - Continue with broad-spectrum empiric antibiotic coverage.  vancomycin , cefepime  and Flagyl  changed to meropenem  and Zyvox  - Surgery consulted, status post I&D 12/11 by Dr. Vernetta  - Follow on intraoperative cultures, growing Proteus Mirabella's (appears pansensitive, but continue with current broad-spectrum coverage meropenem  and Zyvox  as still pending for gram-positive rods and cocci) -continue with wound care   ESRD  - Renal consulted, HD per renal  Diabetes mellitus type 2 Well-controlled based on prior hemoglobin A1c from 2024 of 5.7%. - SSI every 4 hours while  patient is altered/NPO.  Controlled.  Resume diet when able/safe to tolerate p.Deborah.   Chronic hypotension Complicated by hemodialysis. Patient is on midodrine  before hemodialysis as an outpatient.   - Hypotensive overnight requiring fluid bolus. - Continue with IV albumin    Hyperlipidemia Noted.  Resume Lipitor   History of paroxysmal atrial fibrillation Noted.  Patient does not have anticoagulation or rate control medication list on home medication list.  Patient is currently in sinus rhythm.   Gout Allopurinol    Depression Anxiety Patient is listed as taking BuSpar , risperidone , trazodone , Remeron .  Holding most of these medications at this time until further improvement and reassessment of mental status.   History of nausea vomiting Patient without current symptoms.  Obesity class I Severe hypophosphatemia Hypokalemia PCM Hypoalbuminemia - Body mass index is 33.98 kg/m. - Appears to be severely malnourished, started on  cortrak tube feed - High risk for refeeding syndrome, phosphorus and potassium low again today, started on supplements, monitor closely  Anemia of ESRD Received 1 unit PRBC 12/12 specially with hypotension  Body mass index is 33.98 kg/m.   DVT prophylaxis: heparin  injection 5,000 Units Start: 02/14/24 2315     Code Status: Full Code:  Family Communication: Discussed with mother at bedside 12/13 Disposition:  Status is: Inpatient Remains inpatient appropriate because: Ongoing AMS.     Consultants:   Nephrology General Surgery PCCM  Procedures:   Hemodialysis Left thigh abscess I&D by Dr. Vernetta 02/16/2024 Left subclavian TLC placement by PCCM 02/17/2024   Subjective:   Mains altered, soft BM x 3 yesterday, tolerating tube feed, Objective:   Vitals:   02/19/24 0048  02/19/24 0332 02/19/24 0721 02/19/24 1200  BP: 130/67 111/62 121/63 115/73  Pulse: 96  92 93  Resp: 19  20 20   Temp: 97.8 F (36.6 C) 97.7 F (36.5 C) 97.6 F (36.4  C) 98.2 F (36.8 C)  TempSrc: Axillary Axillary Axillary Axillary  SpO2: 100%  97% 97%  Weight:      Height:        Somnolent, confused, but wakes up easily, oriented to name  Lung clear to auscultation  Regular rate and rhythm  Abdomen soft  Left groin bandaged  Right BKA     Data Reviewed:   I have personally reviewed following labs and imaging studies   CBC: Recent Labs  Lab 02/15/24 0836 02/16/24 0412 02/17/24 0257 02/17/24 1415 02/18/24 0321 02/19/24 0316  WBC 35.0*   < > 31.2*  --  24.6* 18.5*  NEUTROABS 29.8*  --   --   --   --   --   HGB 8.1*   < > 7.6* 8.2* 7.8* 7.5*  HCT 25.3*   < > 23.5* 24.0* 23.7* 23.2*  MCV 92.7   < > 89.4  --  86.2 86.2  PLT 183   < > 178  --  173 182   < > = values in this interval not displayed.    Basic Metabolic Panel: Recent Labs  Lab 02/14/24 1402 02/14/24 1413 02/16/24 0412 02/17/24 0257 02/17/24 1415 02/18/24 0321 02/18/24 1839 02/19/24 0316  NA 135   < > 132* 135 134* 133* 133* 133*  K 4.1   < > 3.3* 3.8 3.3* 3.0* 3.6 3.4*  CL 96*   < > 98 99  --  96* 98 96*  CO2 27   < > 27 27  --  28 29 28   GLUCOSE 171*   < > 116* 103*  --  139* 104* 182*  BUN 21*   < > 14 18  --  8 <5* <5*  CREATININE 6.36*   < > 3.71* 4.76*  --  2.77* 1.43* 1.88*  CALCIUM  8.8*   < > 7.6* 7.9*  --  7.4* 7.2* 7.1*  MG 1.8  --   --  1.6*  --   --   --   --   PHOS  --   --   --  2.3*  --  1.1* 1.1* 3.1   < > = values in this interval not displayed.    Liver Function Tests: Recent Labs  Lab 02/14/24 1402 02/15/24 0836 02/17/24 0257 02/18/24 0321 02/18/24 1839 02/19/24 0316  AST 13* 10*  --   --   --   --   ALT 10 8  --   --   --   --   ALKPHOS 109 87  --   --   --   --   BILITOT 0.9 0.9  --   --   --   --   PROT 6.3* 4.8*  --   --   --   --   ALBUMIN  1.7* <1.5* <1.5* 1.8* 2.0* 1.8*    CBG: Recent Labs  Lab 02/19/24 0336 02/19/24 0721 02/19/24 1200  GLUCAP 190* 211* 190*    Microbiology Studies:   Recent Results (from  the past 240 hours)  Culture, blood (single)     Status: None   Collection Time: 02/14/24  1:10 PM   Specimen: BLOOD RIGHT FOREARM  Result Value Ref Range Status   Specimen Description BLOOD RIGHT FOREARM  Final   Special Requests   Final    BOTTLES DRAWN AEROBIC AND ANAEROBIC Blood Culture results may not be optimal due to an inadequate volume of blood received in culture bottles   Culture   Final    NO GROWTH 5 DAYS Performed at [facility], [address], [place], [place] [address]    Report Status 02/19/2024 FINAL  Final  Resp panel by RT-PCR (RSV, Flu A&B, Covid) Anterior Nasal Swab     Status: None   Collection Time: 02/14/24  3:23 PM   Specimen: Anterior Nasal Swab  Result Value Ref Range Status   SARS Coronavirus 2 by RT PCR NEGATIVE NEGATIVE Final   Influenza A by PCR NEGATIVE NEGATIVE Final   Influenza B by PCR NEGATIVE NEGATIVE Final    Comment: (NOTE) The Xpert Xpress SARS-CoV-2/FLU/RSV plus assay is intended as an aid in the diagnosis of influenza from Nasopharyngeal swab specimens and should not be used as a sole basis for treatment. Nasal washings and aspirates are unacceptable for Xpert Xpress SARS-CoV-2/FLU/RSV testing.  Fact Sheet for Patients: bloggercourse.com  Fact Sheet for Healthcare Providers: seriousbroker.it  This test is not yet approved or cleared by the [place]  FDA and has been authorized for detection and/or diagnosis of SARS-CoV-2 by FDA under an Emergency Use Authorization (EUA). This EUA will remain in effect (meaning this test can be used) for the duration of the COVID-19 declaration under Section 564(b)(1) of the Act, 21 U.S.C. section 360bbb-3(b)(1), unless the authorization is terminated or revoked.     Resp Syncytial Virus by PCR NEGATIVE NEGATIVE Final    Comment: (NOTE) Fact Sheet for Patients: bloggercourse.com  Fact Sheet for Healthcare  Providers: seriousbroker.it  This test is not yet approved or cleared by the [place]  FDA and has been authorized for detection and/or diagnosis of SARS-CoV-2 by FDA under an Emergency Use Authorization (EUA). This EUA will remain in effect (meaning this test can be used) for the duration of the COVID-19 declaration under Section 564(b)(1) of the Act, 21 U.S.C. section 360bbb-3(b)(1), unless the authorization is terminated or revoked.  Performed at [facility], [address], [place], [place] [address]   Culture, blood (Routine X 2) w Reflex to ID Panel     Status: None (Preliminary result)   Collection Time: 02/16/24  4:12 AM   Specimen: BLOOD RIGHT HAND  Result Value Ref Range Status   Specimen Description BLOOD RIGHT HAND  Final   Special Requests   Final    BOTTLES DRAWN AEROBIC ONLY Blood Culture results may not be optimal due to an inadequate volume of blood received in culture bottles   Culture   Final    NO GROWTH 3 DAYS Performed at [facility], [address], [place], [place] [address]    Report Status PENDING  Incomplete  Culture, blood (Routine X 2) w Reflex to ID Panel     Status: None (Preliminary result)   Collection Time: 02/16/24  4:14 AM   Specimen: BLOOD RIGHT HAND  Result Value Ref Range Status   Specimen Description BLOOD RIGHT HAND  Final   Special Requests   Final    BOTTLES DRAWN AEROBIC ONLY Blood Culture results may not be optimal due to an inadequate volume of blood received in culture bottles   Culture   Final    NO GROWTH 3 DAYS Performed at [facility], [address] [place], [place] [address]  Report Status PENDING  Incomplete  Aerobic/Anaerobic Culture w Gram Stain (surgical/deep wound)     Status: None (Preliminary result)   Collection Time: 02/16/24  2:30 PM   Specimen: Path fluid; Body Fluid  Result Value Ref Range Status   Specimen Description FLUID  Final   Special Requests LEFT  THIGH ABSCESS  Final   Gram Stain   Final    ABUNDANT WBC PRESENT, PREDOMINANTLY PMN RARE GRAM NEGATIVE RODS FEW GRAM POSITIVE RODS RARE GRAM POSITIVE COCCI    Culture   Final    MODERATE PROTEUS MIRABILIS CULTURE REINCUBATED FOR BETTER GROWTH HOLDING FOR POSSIBLE ANAEROBE Performed at [facility], [address] [place], [place] [address]    Report Status PENDING  Incomplete   Organism ID, Bacteria PROTEUS MIRABILIS  Final      Susceptibility   Proteus mirabilis - MIC*    AMPICILLIN <=2 SENSITIVE Sensitive     CEFAZOLIN  (NON-URINE) 2 SENSITIVE Sensitive     CEFEPIME  <=0.12 SENSITIVE Sensitive     ERTAPENEM <=0.12 SENSITIVE Sensitive     CEFTRIAXONE  <=0.25 SENSITIVE Sensitive     CIPROFLOXACIN  <=0.06 SENSITIVE Sensitive     GENTAMICIN <=1 SENSITIVE Sensitive     MEROPENEM  0.5 SENSITIVE Sensitive     TRIMETH/SULFA <=20 SENSITIVE Sensitive     AMPICILLIN/SULBACTAM <=2 SENSITIVE Sensitive     PIP/TAZO Value in next row Sensitive      <=4 SENSITIVEThis is a modified FDA-approved test that has been validated and its performance characteristics determined by the reporting laboratory.  This laboratory is certified under the Clinical Laboratory Improvement Amendments CLIA as qualified to perform high complexity clinical laboratory testing.    * MODERATE PROTEUS MIRABILIS    Radiology Studies:  EEG adult Result Date: 03-01-24 Shelton Arlin KIDD, MD     03-01-24 11:13 AM Patient Name: Deborah [name] MRN: [id number] Epilepsy Attending: Arlin KIDD Shelton Referring Physician/Provider: Sherlon Brayton RAMAN, MD Date: 03/01/24 Duration: 23.54 mins Patient history: 58yo F with ams. EEG to evaluate for seizure Level of alertness: Awake AEDs during EEG study: None Technical aspects: This EEG study was done with scalp electrodes positioned according to the 10-20 International system of electrode placement. Electrical activity was reviewed with band pass filter of 1-70Hz , sensitivity of  7 uV/mm, display speed of 64mm/sec with a 60Hz  notched filter applied as appropriate. EEG data were recorded continuously and digitally stored.  Video monitoring was available and reviewed as appropriate. Description: EEG showed continuous generalized polymorphic sharply contoured 3 to 6 Hz theta-delta slowing. Hyperventilation and photic stimulation were not performed.   ABNORMALITY - Continuous slow, generalized IMPRESSION: This study is suggestive of generalized cerebral dysfunction ( encephalopathy). No seizures or epileptiform discharges were seen throughout the recording. Priyanka Deborah [name]   VAS US  LOWER EXTREMITY VENOUS (DVT) Result Date: 02/18/2024  Lower Venous DVT Study Patient Name:  Deborah [name]  Date of Exam:   02/17/2024 Medical Rec #: [id number]             Accession #:    [id number] Date of Birth: January 30, 1966             Patient Gender: F Patient Age:   89 years Exam Location:  [facility] Procedure:      VAS US  LOWER EXTREMITY VENOUS (DVT) Referring Phys: ANAND HONGALGI --------------------------------------------------------------------------------  Indications: Swelling, and Edema.  Limitations: Bandages, open wound and post-op. Comparison Study: Previous study of the bilateral lower extremities on  1.25.2023. Performing Technologist: Edilia Elden Appl  Examination Guidelines: A complete evaluation includes B-mode imaging, spectral Doppler, color Doppler, and power Doppler as needed of all accessible portions of each vessel. Bilateral testing is considered an integral part of a complete examination. Limited examinations for reoccurring indications may be performed as noted. The reflux portion of the exam is performed with the patient in reverse Trendelenburg.  +-----+---------------+---------+-----------+----------+--------------+ RIGHTCompressibilityPhasicitySpontaneityPropertiesThrombus Aging  +-----+---------------+---------+-----------+----------+--------------+ CFV  Full           Yes      Yes                                 +-----+---------------+---------+-----------+----------+--------------+ SFJ  Full           Yes      Yes                                 +-----+---------------+---------+-----------+----------+--------------+   +---------+---------------+---------+-----------+----------+-------------------+ LEFT     CompressibilityPhasicitySpontaneityPropertiesThrombus Aging      +---------+---------------+---------+-----------+----------+-------------------+ CFV                                                   Not well visualized +---------+---------------+---------+-----------+----------+-------------------+ SFJ                                                   Not well visualized +---------+---------------+---------+-----------+----------+-------------------+ FV Prox                                               Not well visualized +---------+---------------+---------+-----------+----------+-------------------+ FV Mid                  Yes      Yes                                      +---------+---------------+---------+-----------+----------+-------------------+ FV Distal               Yes      Yes                                      +---------+---------------+---------+-----------+----------+-------------------+ PFV                                                   Not well visualized +---------+---------------+---------+-----------+----------+-------------------+ POP                     Yes      Yes                                      +---------+---------------+---------+-----------+----------+-------------------+  PTV                     Yes      Yes                                      +---------+---------------+---------+-----------+----------+-------------------+ PERO                                                   Not well visualized +---------+---------------+---------+-----------+----------+-------------------+ Patient is post thigh abscess drainage, unable to compress due to pain. Gross patency of left lower extremity performed. No obvious signs of deep vein thrombosis noted.    Summary: RIGHT: - No evidence of common femoral vein obstruction.   LEFT: - There is no obvious evidence of deep vein thrombosis in the lower extremity. However, portions of this examination were limited- see technologist comments above.  - No cystic structure found in the popliteal fossa.  *See table(s) above for measurements and observations. Electronically signed by Gaile New MD on 02/18/2024 at 10:50:06 AM.    Final    MRI brain without contrast Result Date: 02/17/2024 EXAM: MRI BRAIN WITHOUT CONTRAST 02/17/2024 03:17:30 PM TECHNIQUE: Multiplanar multisequence MRI of the head/brain was performed without the administration of intravenous contrast. COMPARISON: Head CT 02/14/2024 and 12/15/2022. CLINICAL HISTORY: AMS, high concern for stroke or infection. FINDINGS: The examination is moderately motion degraded. BRAIN AND VENTRICLES: There is no evidence of an acute infarct, intracranial hemorrhage, mass, midline shift, hydrocephalus, or extra-axial fluid collection. Patchy T2 hyperintensities in the pons are similar to the prior MRI and nonspecific but compatible with chronic small vessel ischemic disease. No age advanced chronic small vessel ischemia is evident in the supratentorial white matter. Cerebral volume is within normal limits for age. Major intracranial vascular flow voids are preserved. ORBITS: Bilateral cataract extraction. SINUSES AND MASTOIDS: No acute abnormality. BONES AND SOFT TISSUES: Normal marrow signal. No acute soft tissue abnormality. IMPRESSION: 1. No acute intracranial abnormality identified on this motion-degraded examination. 2. Unchanged chronic small vessel ischemia in the pons. Electronically  signed by: Dasie Hamburg MD 02/17/2024 03:44 PM EST RP Workstation: [id number]    Scheduled Meds:    allopurinol   100 mg Per Tube BID   atorvastatin   10 mg Per Tube QPM   cholestyramine  light  4 g Per Tube BID   famotidine   20 mg Per Tube QAC breakfast   ferric citrate   420 mg Oral TID WC   folic acid   1 mg Per Tube Daily   heparin   5,000 Units Subcutaneous Q8H   hydrocerin  1 Application Topical BID   hyoscyamine   0.125 mg Sublingual TID   insulin  aspart  0-6 Units Subcutaneous TID WC   midodrine   10 mg Per Tube TID   pantoprazole   40 mg Oral Daily   thiamine   200 mg Per Tube Daily    Continuous Infusions:    feeding supplement (OSMOLITE 1.5 CAL) 1,000 mL (02/19/24 0222)   linezolid  (ZYVOX ) IV 600 mg (02/19/24 0919)   meropenem  (MERREM ) IV 500 mg (02/18/24 1917)     LOS: 5 days     Brayton Lye, MD,     To contact the attending provider between 7A-7P or the covering provider during after hours 7P-7A, please log into  the web site [email or web address] and access using universal [place] password for that web site. If you do not have the password, please call the hospital operator.  02/19/2024, 1:38 PM

## 2024-02-19 NOTE — Progress Notes (Signed)
 EEG complete - results pending

## 2024-02-19 NOTE — Progress Notes (Signed)
 Progress Note  3 Days Post-Op  Subjective: Patient having EEG completed during encounter.  Remains somnolent but arouses to stimuli/voices.   ROS  All negative with the exception of above.  Objective: Vital signs in last 24 hours: Temp:  [97.6 F (36.4 C)-99.4 F (37.4 C)] 97.6 F (36.4 C) (12/14 0721) Pulse Rate:  [90-98] 92 (12/14 0721) Resp:  [11-20] 20 (12/14 0721) BP: (91-138)/(50-107) 121/63 (12/14 0721) SpO2:  [94 %-100 %] 97 % (12/14 0721) Weight:  [95.5 kg] 95.5 kg (12/13 1703) Last BM Date : 02/18/24  Intake/Output from previous day: 12/13 0701 - 12/14 0700 In: 1500.1 [NG/GT:40; IV Piggyback:1460.1] Out: 0  Intake/Output this shift: No intake/output data recorded.  PE: General: Somnolent female who is laying in bed in NAD undergoing EEG. HEENT: Head is normocephalic, atraumatic.  Heart:  Normal HR during encounter.  Skin: Dressing covering left groin/thigh region. Photo from 12/13 showing fibrinous exudate with some clear discharge. No active bleeding or necrosis noted.     Lab Results:  Recent Labs    02/18/24 0321 02/19/24 0316  WBC 24.6* 18.5*  HGB 7.8* 7.5*  HCT 23.7* 23.2*  PLT 173 182   BMET Recent Labs    02/18/24 1839 02/19/24 0316  NA 133* 133*  K 3.6 3.4*  CL 98 96*  CO2 29 28  GLUCOSE 104* 182*  BUN <5* <5*  CREATININE 1.43* 1.88*  CALCIUM  7.2* 7.1*   PT/INR No results for input(s): LABPROT, INR in the last 72 hours. CMP     Component Value Date/Time   NA 133 (L) 02/19/2024 0316   NA 137 09/20/2019 1530   K 3.4 (L) 02/19/2024 0316   CL 96 (L) 02/19/2024 0316   CO2 28 02/19/2024 0316   GLUCOSE 182 (H) 02/19/2024 0316   BUN <5 (L) 02/19/2024 0316   BUN 18 09/20/2019 1530   CREATININE 1.88 (H) 02/19/2024 0316   CALCIUM  7.1 (L) 02/19/2024 0316   PROT 4.8 (L) 02/15/2024 0836   ALBUMIN  1.8 (L) 02/19/2024 0316   AST 10 (L) 02/15/2024 0836   ALT 8 02/15/2024 0836   ALKPHOS 87 02/15/2024 0836   BILITOT 0.9  02/15/2024 0836   GFRNONAA 30 (L) 02/19/2024 0316   GFRAA 49 (L) 11/30/2019 0356   Lipase     Component Value Date/Time   LIPASE 24 06/03/2023 0905       Studies/Results: VAS US  LOWER EXTREMITY VENOUS (DVT) Result Date: 02/18/2024  Lower Venous DVT Study Patient Name:  JA PISTOLE  Date of Exam:   02/17/2024 Medical Rec #: [id number]             Accession #:    [id number] Date of Birth: 08/24/1965             Patient Gender: F Patient Age:   58 years Exam Location:  [facility] Procedure:      VAS US  LOWER EXTREMITY VENOUS (DVT) Referring Phys: ANAND HONGALGI --------------------------------------------------------------------------------  Indications: Swelling, and Edema.  Limitations: Bandages, open wound and post-op. Comparison Study: Previous study of the bilateral lower extremities on                   1.25.2023. Performing Technologist: Edilia Elden Appl  Examination Guidelines: A complete evaluation includes B-mode imaging, spectral Doppler, color Doppler, and power Doppler as needed of all accessible portions of each vessel. Bilateral testing is considered an integral part of a complete examination. Limited examinations for reoccurring indications may be performed as  noted. The reflux portion of the exam is performed with the patient in reverse Trendelenburg.  +-----+---------------+---------+-----------+----------+--------------+ RIGHTCompressibilityPhasicitySpontaneityPropertiesThrombus Aging +-----+---------------+---------+-----------+----------+--------------+ CFV  Full           Yes      Yes                                 +-----+---------------+---------+-----------+----------+--------------+ SFJ  Full           Yes      Yes                                 +-----+---------------+---------+-----------+----------+--------------+   +---------+---------------+---------+-----------+----------+-------------------+ LEFT      CompressibilityPhasicitySpontaneityPropertiesThrombus Aging      +---------+---------------+---------+-----------+----------+-------------------+ CFV                                                   Not well visualized +---------+---------------+---------+-----------+----------+-------------------+ SFJ                                                   Not well visualized +---------+---------------+---------+-----------+----------+-------------------+ FV Prox                                               Not well visualized +---------+---------------+---------+-----------+----------+-------------------+ FV Mid                  Yes      Yes                                      +---------+---------------+---------+-----------+----------+-------------------+ FV Distal               Yes      Yes                                      +---------+---------------+---------+-----------+----------+-------------------+ PFV                                                   Not well visualized +---------+---------------+---------+-----------+----------+-------------------+ POP                     Yes      Yes                                      +---------+---------------+---------+-----------+----------+-------------------+ PTV                     Yes      Yes                                      +---------+---------------+---------+-----------+----------+-------------------+  PERO                                                  Not well visualized +---------+---------------+---------+-----------+----------+-------------------+ Patient is post thigh abscess drainage, unable to compress due to pain. Gross patency of left lower extremity performed. No obvious signs of deep vein thrombosis noted.    Summary: RIGHT: - No evidence of common femoral vein obstruction.   LEFT: - There is no obvious evidence of deep vein thrombosis in the lower extremity. However,  portions of this examination were limited- see technologist comments above.  - No cystic structure found in the popliteal fossa.  *See table(s) above for measurements and observations. Electronically signed by Gaile New MD on 02/18/2024 at 10:50:06 AM.    Final    MRI brain without contrast Result Date: 02/17/2024 EXAM: MRI BRAIN WITHOUT CONTRAST 02/17/2024 03:17:30 PM TECHNIQUE: Multiplanar multisequence MRI of the head/brain was performed without the administration of intravenous contrast. COMPARISON: Head CT 02/14/2024 and 12/15/2022. CLINICAL HISTORY: AMS, high concern for stroke or infection. FINDINGS: The examination is moderately motion degraded. BRAIN AND VENTRICLES: There is no evidence of an acute infarct, intracranial hemorrhage, mass, midline shift, hydrocephalus, or extra-axial fluid collection. Patchy T2 hyperintensities in the pons are similar to the prior MRI and nonspecific but compatible with chronic small vessel ischemic disease. No age advanced chronic small vessel ischemia is evident in the supratentorial white matter. Cerebral volume is within normal limits for age. Major intracranial vascular flow voids are preserved. ORBITS: Bilateral cataract extraction. SINUSES AND MASTOIDS: No acute abnormality. BONES AND SOFT TISSUES: Normal marrow signal. No acute soft tissue abnormality. IMPRESSION: 1. No acute intracranial abnormality identified on this motion-degraded examination. 2. Unchanged chronic small vessel ischemia in the pons. Electronically signed by: Dasie Hamburg MD 02/17/2024 03:44 PM EST RP Workstation: [id number]    Anti-infectives: Anti-infectives (From admission, onward)    Start     Dose/Rate Route Frequency Ordered Stop   02/18/24 1800  meropenem  (MERREM ) 500 mg in sodium chloride  0.9 % 100 mL IVPB        500 mg 200 mL/hr over 30 Minutes Intravenous Every 24 hours 02/17/24 0941     02/17/24 1030  meropenem  (MERREM ) 500 mg in sodium chloride  0.9 % 100 mL IVPB         500 mg 200 mL/hr over 30 Minutes Intravenous  Once 02/17/24 0941 02/17/24 1059   02/17/24 1000  meropenem  (MERREM ) 500 mg in sodium chloride  0.9 % 100 mL IVPB  Status:  Discontinued        500 mg 200 mL/hr over 30 Minutes Intravenous Every 24 hours 02/16/24 1045 02/17/24 0941   02/16/24 1145  linezolid  (ZYVOX ) IVPB 600 mg        600 mg 300 mL/hr over 60 Minutes Intravenous Every 12 hours 02/16/24 1045     02/16/24 1145  meropenem  (MERREM ) 500 mg in sodium chloride  0.9 % 100 mL IVPB  Status:  Discontinued        500 mg 200 mL/hr over 30 Minutes Intravenous  Once 02/16/24 1045 02/17/24 0941   02/15/24 2000  ceFEPIme  (MAXIPIME ) 1 g in sodium chloride  0.9 % 100 mL IVPB  Status:  Discontinued        1 g 200 mL/hr over 30 Minutes Intravenous Every 24 hours 02/14/24 1649 02/16/24 1045   02/15/24  1700  vancomycin  (VANCOCIN ) IVPB 1000 mg/200 mL premix        1,000 mg 200 mL/hr over 60 Minutes Intravenous  Once 02/15/24 1528 02/15/24 2015   02/15/24 1415  vancomycin  variable dose per unstable renal function (pharmacist dosing)  Status:  Discontinued         Does not apply See admin instructions 02/15/24 1415 02/16/24 1045   02/15/24 0600  metroNIDAZOLE  (FLAGYL ) IVPB 500 mg  Status:  Discontinued        500 mg 100 mL/hr over 60 Minutes Intravenous Every 12 hours 02/14/24 2305 02/16/24 1046   02/14/24 1445  aztreonam  (AZACTAM ) 2 g in sodium chloride  0.9 % 100 mL IVPB  Status:  Discontinued        2 g 200 mL/hr over 30 Minutes Intravenous  Once 02/14/24 1435 02/14/24 1440   02/14/24 1445  metroNIDAZOLE  (FLAGYL ) IVPB 500 mg        500 mg 100 mL/hr over 60 Minutes Intravenous  Once 02/14/24 1435 02/14/24 1844   02/14/24 1445  vancomycin  (VANCOCIN ) IVPB 1000 mg/200 mL premix  Status:  Discontinued        1,000 mg 200 mL/hr over 60 Minutes Intravenous  Once 02/14/24 1435 02/14/24 1444   02/14/24 1445  ceFEPIme  (MAXIPIME ) 2 g in sodium chloride  0.9 % 100 mL IVPB        2 g 200 mL/hr over 30 Minutes  Intravenous  Once 02/14/24 1444 02/14/24 1606   02/14/24 1445  vancomycin  (VANCOREADY) IVPB 2000 mg/400 mL        2,000 mg 200 mL/hr over 120 Minutes Intravenous  Once 02/14/24 1444 02/14/24 1848        Assessment/Plan Left thigh abscess POD3: S/P I&D 12/11 Dr. Vernetta - Afebrile. - WBC improved to 18.5 from24.6; HGB 7.5 from 7.8. Continue to monitor - Unable to see wound during encounter today as she was undergoing EEG. Photo as above without necrotic tissue or active bleeding. - Continue twice daily moist-to-dry dressing changes or when soiled. - Continue IV abx and follow cultures; Cultures pending Proteus mirabilis so far. Possible anaerobe. - Will continue to follow.    FEN NPO ID: [email or web address] , linezolid  VTE: SQH   Encephalopathy ESRD on HD Anemia of ESRD Chronic hypotension DM2 HLD Afib - in sinus here Gout Depression/anxiety    LOS: 5 days   I reviewed specialist notes, consulting provider notes, nursing notes, hospitalist notes, last 24 h vitals and pain scores, last 48 h intake and output, last 24 h labs and trends, and last 24 h imaging results.   Marjorie Carlyon Favre, [facility] Surgery 02/19/2024, 10:54 AM Please see Amion for pager number during day hours 7:00am-4:30pm

## 2024-02-19 NOTE — Progress Notes (Signed)
 [facility] Progress Note   Subjective:   A bit more alert today, opens eyes to voice and shakes head no when asked if she is having any trouble breathing.   Objective Vitals:   02/18/24 1802 02/19/24 0048 02/19/24 0332 02/19/24 0721  BP: 121/70 130/67 111/62 121/63  Pulse: 97 96  92  Resp: 15 19  20   Temp: 97.7 F (36.5 C) 97.8 F (36.6 C) 97.7 F (36.5 C) 97.6 F (36.4 C)  TempSrc: Oral Axillary Axillary Axillary  SpO2: 100% 100%  97%  Weight:      Height:       Physical Exam General: chronically ill appearing female in NAD Heart: RRR, no murmurs Lungs: CTA bilaterally, respirations unlabored on RA Abdomen: Soft, non-distended, +BS Extremities: no edema b/l lower extremities Dialysis Access: [facility]  Additional Objective Labs: Basic Metabolic Panel: Recent Labs  Lab 02/18/24 0321 02/18/24 1839 02/19/24 0316  NA 133* 133* 133*  K 3.0* 3.6 3.4*  CL 96* 98 96*  CO2 28 29 28   GLUCOSE 139* 104* 182*  BUN 8 <5* <5*  CREATININE 2.77* 1.43* 1.88*  CALCIUM  7.4* 7.2* 7.1*  PHOS 1.1* 1.1* 3.1   Liver Function Tests: Recent Labs  Lab 02/14/24 1402 02/15/24 0836 02/17/24 0257 02/18/24 0321 02/18/24 1839 02/19/24 0316  AST 13* 10*  --   --   --   --   ALT 10 8  --   --   --   --   ALKPHOS 109 87  --   --   --   --   BILITOT 0.9 0.9  --   --   --   --   PROT 6.3* 4.8*  --   --   --   --   ALBUMIN  1.7* <1.5*   < > 1.8* 2.0* 1.8*   < > = values in this interval not displayed.   No results for input(s): LIPASE, AMYLASE in the last 168 hours. CBC: Recent Labs  Lab 02/15/24 0836 02/16/24 0412 02/17/24 0257 02/17/24 1415 02/18/24 0321 02/19/24 0316  WBC 35.0* 28.8* 31.2*  --  24.6* 18.5*  NEUTROABS 29.8*  --   --   --   --   --   HGB 8.1* 8.3* 7.6* 8.2* 7.8* 7.5*  HCT 25.3* 25.4* 23.5* 24.0* 23.7* 23.2*  MCV 92.7 88.8 89.4  --  86.2 86.2  PLT 183 166 178  --  173 182   Blood Culture    Component Value Date/Time   SDES FLUID  02/16/2024 1430   SPECREQUEST LEFT THIGH ABSCESS 02/16/2024 1430   CULT  02/16/2024 1430    MODERATE PROTEUS MIRABILIS CULTURE REINCUBATED FOR BETTER GROWTH HOLDING FOR POSSIBLE ANAEROBE Performed at [facility] Lab, [address] [place], [place] [address]    REPTSTATUS PENDING 02/16/2024 1430    Cardiac Enzymes: No results for input(s): CKTOTAL, CKMB, CKMBINDEX, TROPONINI in the last 168 hours. CBG: Recent Labs  Lab 02/18/24 2056 02/18/24 2201 02/19/24 0050 02/19/24 0336 02/19/24 0721  GLUCAP 144* 148* 209* 190* 211*   Iron  Studies: No results for input(s): IRON , TIBC, TRANSFERRIN, FERRITIN in the last 72 hours. @lablastinr3 @ Studies/Results: VAS US  LOWER EXTREMITY VENOUS (DVT) Result Date: 02/18/2024  Lower Venous DVT Study Patient Name:  ELLENE BLOODSAW  Date of Exam:   02/17/2024 Medical Rec #: [id number]             Accession #:    [id number] Date of Birth: 19-Aug-1965  Patient Gender: F Patient Age:   58 years Exam Location:  [facility] Procedure:      VAS US  LOWER EXTREMITY VENOUS (DVT) Referring Phys: ANAND HONGALGI --------------------------------------------------------------------------------  Indications: Swelling, and Edema.  Limitations: Bandages, open wound and post-op. Comparison Study: Previous study of the bilateral lower extremities on                   1.25.2023. Performing Technologist: Edilia Elden Appl  Examination Guidelines: A complete evaluation includes B-mode imaging, spectral Doppler, color Doppler, and power Doppler as needed of all accessible portions of each vessel. Bilateral testing is considered an integral part of a complete examination. Limited examinations for reoccurring indications may be performed as noted. The reflux portion of the exam is performed with the patient in reverse Trendelenburg.  +-----+---------------+---------+-----------+----------+--------------+  RIGHTCompressibilityPhasicitySpontaneityPropertiesThrombus Aging +-----+---------------+---------+-----------+----------+--------------+ CFV  Full           Yes      Yes                                 +-----+---------------+---------+-----------+----------+--------------+ SFJ  Full           Yes      Yes                                 +-----+---------------+---------+-----------+----------+--------------+   +---------+---------------+---------+-----------+----------+-------------------+ LEFT     CompressibilityPhasicitySpontaneityPropertiesThrombus Aging      +---------+---------------+---------+-----------+----------+-------------------+ CFV                                                   Not well visualized +---------+---------------+---------+-----------+----------+-------------------+ SFJ                                                   Not well visualized +---------+---------------+---------+-----------+----------+-------------------+ FV Prox                                               Not well visualized +---------+---------------+---------+-----------+----------+-------------------+ FV Mid                  Yes      Yes                                      +---------+---------------+---------+-----------+----------+-------------------+ FV Distal               Yes      Yes                                      +---------+---------------+---------+-----------+----------+-------------------+ PFV  Not well visualized +---------+---------------+---------+-----------+----------+-------------------+ POP                     Yes      Yes                                      +---------+---------------+---------+-----------+----------+-------------------+ PTV                     Yes      Yes                                       +---------+---------------+---------+-----------+----------+-------------------+ PERO                                                  Not well visualized +---------+---------------+---------+-----------+----------+-------------------+ Patient is post thigh abscess drainage, unable to compress due to pain. Gross patency of left lower extremity performed. No obvious signs of deep vein thrombosis noted.    Summary: RIGHT: - No evidence of common femoral vein obstruction.   LEFT: - There is no obvious evidence of deep vein thrombosis in the lower extremity. However, portions of this examination were limited- see technologist comments above.  - No cystic structure found in the popliteal fossa.  *See table(s) above for measurements and observations. Electronically signed by Gaile New MD on 02/18/2024 at 10:50:06 AM.    Final    MRI brain without contrast Result Date: 02/17/2024 EXAM: MRI BRAIN WITHOUT CONTRAST 02/17/2024 03:17:30 PM TECHNIQUE: Multiplanar multisequence MRI of the head/brain was performed without the administration of intravenous contrast. COMPARISON: Head CT 02/14/2024 and 12/15/2022. CLINICAL HISTORY: AMS, high concern for stroke or infection. FINDINGS: The examination is moderately motion degraded. BRAIN AND VENTRICLES: There is no evidence of an acute infarct, intracranial hemorrhage, mass, midline shift, hydrocephalus, or extra-axial fluid collection. Patchy T2 hyperintensities in the pons are similar to the prior MRI and nonspecific but compatible with chronic small vessel ischemic disease. No age advanced chronic small vessel ischemia is evident in the supratentorial white matter. Cerebral volume is within normal limits for age. Major intracranial vascular flow voids are preserved. ORBITS: Bilateral cataract extraction. SINUSES AND MASTOIDS: No acute abnormality. BONES AND SOFT TISSUES: Normal marrow signal. No acute soft tissue abnormality. IMPRESSION: 1. No acute intracranial  abnormality identified on this motion-degraded examination. 2. Unchanged chronic small vessel ischemia in the pons. Electronically signed by: Dasie Hamburg MD 02/17/2024 03:44 PM EST RP Workstation: [id number]   Medications:  feeding supplement (OSMOLITE 1.5 CAL) 1,000 mL (02/19/24 0222)   linezolid  (ZYVOX ) IV 600 mg (02/19/24 0919)   meropenem  (MERREM ) IV 500 mg (02/18/24 1917)    allopurinol   100 mg Per Tube BID   atorvastatin   10 mg Per Tube QPM   cholestyramine  light  4 g Per Tube BID   famotidine   20 mg Per Tube QAC breakfast   ferric citrate   420 mg Oral TID WC   folic acid   1 mg Per Tube Daily   heparin   5,000 Units Subcutaneous Q8H   hydrocerin  1 Application Topical BID   hyoscyamine   0.125 mg Sublingual TID   insulin  aspart  0-6 Units Subcutaneous TID WC  midodrine   10 mg Per Tube TID   pantoprazole   40 mg Oral Daily   phosphorus  500 mg Per Tube Q4H   thiamine   200 mg Per Tube Daily    Dialysis Orders: [facility] 4:00 400BFR EDW 96.1 kg 3K/2.5Ca TDC No bolus heparin    Assessment/Plan: AMS/ possible sepsis: WBC remain elevated, on antibiotics per primary team. Changed from cefepime  to meropenem .  L thigh abscess. Surgery consulted, s/p I&D on 02/16/24 ESRD: on HD TTS. HD Tuesday per regular schedule. Cr and BUN are now low but no UOP? Unclear why BUN would be so low. Will continue to follow.  Hypotension.  Has chronic low BP's takes midodrine  pre HD three times per week. Continue here.  Volume: bp's soft, no pitting edema. No UF with HD today Anemia. Hgb 7.5,  Mircera 225  given 12/6 at OP clinic, not due for next dose yet.  Hypokalemia/hypophos: After tube feeds initiated, receiving supplementation.     Lucie Collet, PA-C 02/19/2024, 10:32 AM  [facility] Pager: [phone]

## 2024-02-20 LAB — BPAM RBC
Blood Product Expiration Date: 202512302359
Blood Product Expiration Date: 202601122359
ISSUE DATE / TIME: 202512121607
Unit Type and Rh: 5100
Unit Type and Rh: 5100

## 2024-02-20 LAB — CBC
HCT: 24.9 % — ABNORMAL LOW (ref 36.0–46.0)
Hemoglobin: 7.9 g/dL — ABNORMAL LOW (ref 12.0–15.0)
MCH: 27.7 pg (ref 26.0–34.0)
MCHC: 31.7 g/dL (ref 30.0–36.0)
MCV: 87.4 fL (ref 80.0–100.0)
Platelets: 212 K/uL (ref 150–400)
RBC: 2.85 MIL/uL — ABNORMAL LOW (ref 3.87–5.11)
RDW: 15.9 % — ABNORMAL HIGH (ref 11.5–15.5)
WBC: 19.9 K/uL — ABNORMAL HIGH (ref 4.0–10.5)
nRBC: 0 % (ref 0.0–0.2)

## 2024-02-20 LAB — TYPE AND SCREEN
ABO/RH(D): O POS
Antibody Screen: NEGATIVE
Donor AG Type: NEGATIVE
Donor AG Type: NEGATIVE
Unit division: 0
Unit division: 0

## 2024-02-20 LAB — AEROBIC/ANAEROBIC CULTURE W GRAM STAIN (SURGICAL/DEEP WOUND)

## 2024-02-20 LAB — RENAL FUNCTION PANEL
Albumin: 1.6 g/dL — ABNORMAL LOW (ref 3.5–5.0)
Anion gap: 9 (ref 5–15)
BUN: 9 mg/dL (ref 6–20)
CO2: 26 mmol/L (ref 22–32)
Calcium: 7.1 mg/dL — ABNORMAL LOW (ref 8.9–10.3)
Chloride: 99 mmol/L (ref 98–111)
Creatinine, Ser: 2.83 mg/dL — ABNORMAL HIGH (ref 0.44–1.00)
GFR, Estimated: 19 mL/min — ABNORMAL LOW (ref >60)
Glucose, Bld: 201 mg/dL — ABNORMAL HIGH (ref 70–99)
Phosphorus: 2.8 mg/dL (ref 2.5–4.6)
Potassium: 3.7 mmol/L (ref 3.5–5.1)
Sodium: 134 mmol/L — ABNORMAL LOW (ref 135–145)

## 2024-02-20 LAB — GLUCOSE, CAPILLARY
Glucose-Capillary: 174 mg/dL — ABNORMAL HIGH (ref 70–99)
Glucose-Capillary: 180 mg/dL — ABNORMAL HIGH (ref 70–99)
Glucose-Capillary: 204 mg/dL — ABNORMAL HIGH (ref 70–99)
Glucose-Capillary: 206 mg/dL — ABNORMAL HIGH (ref 70–99)
Glucose-Capillary: 208 mg/dL — ABNORMAL HIGH (ref 70–99)
Glucose-Capillary: 213 mg/dL — ABNORMAL HIGH (ref 70–99)
Glucose-Capillary: 221 mg/dL — ABNORMAL HIGH (ref 70–99)

## 2024-02-20 MED ORDER — GERHARDT'S BUTT CREAM
TOPICAL_CREAM | Freq: Three times a day (TID) | CUTANEOUS | Status: DC
  Filled 2024-02-20: qty 60

## 2024-02-20 MED ORDER — FENTANYL CITRATE (PF) 50 MCG/ML IJ SOSY
12.5000 ug | PREFILLED_SYRINGE | Freq: Two times a day (BID) | INTRAMUSCULAR | Status: DC | PRN
  Administered 2024-02-20: 09:00:00 12.5 ug via INTRAVENOUS
  Filled 2024-02-20: qty 1

## 2024-02-20 MED ORDER — INSULIN ASPART 100 UNIT/ML IJ SOLN
0.0000 [IU] | INTRAMUSCULAR | Status: DC
  Administered 2024-02-20: 21:00:00 2 [IU] via SUBCUTANEOUS
  Administered 2024-02-20: 16:00:00 1 [IU] via SUBCUTANEOUS
  Administered 2024-02-20 – 2024-02-21 (×2): 2 [IU] via SUBCUTANEOUS
  Administered 2024-02-21 (×2): 1 [IU] via SUBCUTANEOUS
  Administered 2024-02-21: 2 [IU] via SUBCUTANEOUS
  Administered 2024-02-21 (×2): 1 [IU] via SUBCUTANEOUS
  Administered 2024-02-22: 10:00:00 2 [IU] via SUBCUTANEOUS
  Administered 2024-02-22: 04:00:00 1 [IU] via SUBCUTANEOUS
  Filled 2024-02-20 (×3): qty 2
  Filled 2024-02-20 (×3): qty 1
  Filled 2024-02-20 (×2): qty 2
  Filled 2024-02-20 (×2): qty 1

## 2024-02-20 MED ORDER — LOPERAMIDE HCL 2 MG PO CAPS
2.0000 mg | ORAL_CAPSULE | Freq: Four times a day (QID) | ORAL | Status: AC | PRN
  Administered 2024-02-21 – 2024-02-23 (×3): 2 mg via ORAL
  Filled 2024-02-20 (×3): qty 1

## 2024-02-20 MED ORDER — VANCOMYCIN HCL IN DEXTROSE 1-5 GM/200ML-% IV SOLN
1000.0000 mg | INTRAVENOUS | Status: DC
  Administered 2024-02-21 – 2024-02-25 (×3): 1000 mg via INTRAVENOUS
  Filled 2024-02-20 (×2): qty 200

## 2024-02-20 MED ORDER — CEFAZOLIN SODIUM-DEXTROSE 2-4 GM/100ML-% IV SOLN
2.0000 g | Freq: Once | INTRAVENOUS | Status: AC
  Administered 2024-02-20: 16:00:00 2 g via INTRAVENOUS
  Filled 2024-02-20: qty 100

## 2024-02-20 MED ORDER — HYDROMORPHONE HCL 1 MG/ML IJ SOLN
0.5000 mg | Freq: Three times a day (TID) | INTRAMUSCULAR | Status: DC | PRN
  Administered 2024-02-20 – 2024-02-22 (×4): 0.5 mg via INTRAVENOUS
  Filled 2024-02-20 (×4): qty 0.5

## 2024-02-20 MED ORDER — CEFAZOLIN SODIUM-DEXTROSE 2-4 GM/100ML-% IV SOLN
2.0000 g | INTRAVENOUS | Status: DC
  Administered 2024-02-21 – 2024-02-25 (×3): 2 g via INTRAVENOUS
  Filled 2024-02-20 (×2): qty 100

## 2024-02-20 MED ORDER — METRONIDAZOLE 500 MG PO TABS
500.0000 mg | ORAL_TABLET | Freq: Two times a day (BID) | ORAL | Status: DC
  Administered 2024-02-20 – 2024-02-25 (×8): 500 mg via ORAL
  Filled 2024-02-20 (×9): qty 1

## 2024-02-20 MED ORDER — VANCOMYCIN HCL 2000 MG/400ML IV SOLN
2000.0000 mg | Freq: Once | INTRAVENOUS | Status: AC
  Administered 2024-02-20: 16:00:00 2000 mg via INTRAVENOUS
  Filled 2024-02-20: qty 400

## 2024-02-20 MED ORDER — HYDROMORPHONE HCL 2 MG PO TABS
4.0000 mg | ORAL_TABLET | Freq: Four times a day (QID) | ORAL | Status: DC | PRN
  Administered 2024-02-20: 10:00:00 4 mg via ORAL
  Filled 2024-02-20: qty 2

## 2024-02-20 NOTE — Plan of Care (Signed)
  Problem: Education: Goal: Ability to describe self-care measures that may prevent or decrease complications (Diabetes Survival Skills Education) will improve Outcome: Progressing Goal: Individualized Educational Video(s) Outcome: Progressing   Problem: Coping: Goal: Ability to adjust to condition or change in health will improve Outcome: Progressing   Problem: Fluid Volume: Goal: Ability to maintain a balanced intake and output will improve Outcome: Progressing   Problem: Health Behavior/Discharge Planning: Goal: Ability to identify and utilize available resources and services will improve Outcome: Progressing Goal: Ability to manage health-related needs will improve Outcome: Progressing   Problem: Metabolic: Goal: Ability to maintain appropriate glucose levels will improve Outcome: Progressing   Problem: Nutritional: Goal: Maintenance of adequate nutrition will improve Outcome: Progressing Goal: Progress toward achieving an optimal weight will improve Outcome: Progressing   Problem: Skin Integrity: Goal: Risk for impaired skin integrity will decrease Outcome: Progressing   Problem: Tissue Perfusion: Goal: Adequacy of tissue perfusion will improve Outcome: Progressing   Problem: Fluid Volume: Goal: Hemodynamic stability will improve Outcome: Progressing   Problem: Clinical Measurements: Goal: Diagnostic test results will improve Outcome: Progressing Goal: Signs and symptoms of infection will decrease Outcome: Progressing   Problem: Respiratory: Goal: Ability to maintain adequate ventilation will improve Outcome: Progressing   Problem: Education: Goal: Knowledge of General Education information will improve Description: Including pain rating scale, medication(s)/side effects and non-pharmacologic comfort measures Outcome: Progressing   Problem: Health Behavior/Discharge Planning: Goal: Ability to manage health-related needs will improve Outcome:  Progressing   Problem: Clinical Measurements: Goal: Ability to maintain clinical measurements within normal limits will improve Outcome: Progressing Goal: Will remain free from infection Outcome: Progressing Goal: Diagnostic test results will improve Outcome: Progressing Goal: Respiratory complications will improve Outcome: Progressing Goal: Cardiovascular complication will be avoided Outcome: Progressing   Problem: Activity: Goal: Risk for activity intolerance will decrease Outcome: Progressing   Problem: Nutrition: Goal: Adequate nutrition will be maintained Outcome: Progressing   Problem: Coping: Goal: Level of anxiety will decrease Outcome: Progressing   Problem: Elimination: Goal: Will not experience complications related to bowel motility Outcome: Progressing Goal: Will not experience complications related to urinary retention Outcome: Progressing   Problem: Pain Managment: Goal: General experience of comfort will improve and/or be controlled Outcome: Progressing   Problem: Safety: Goal: Ability to remain free from injury will improve Outcome: Progressing   Problem: Skin Integrity: Goal: Risk for impaired skin integrity will decrease Outcome: Progressing

## 2024-02-20 NOTE — TOC Initial Note (Signed)
 Transition of Care [facility]) - Initial/Assessment Note    Patient Details  Name: Deborah [name] MRN: [id number] Date of Birth: [birth year]  Transition of Care [facility]) CM/SW Contact:    Inocente GORMAN Kindle, LCSW Phone Number: 02/20/2024, 11:07 AM  Clinical Narrative:                 Patient was admitted from dialysis center and CSW confirmed with facility that patient resides at [facility] under long term care since April and can return when stable. Will continue to follow.    Skilled Nursing Rehab Facilities-   [email or web address]   Ratings out of 5 stars (5 the highest)  Name Address  Phone # Quality Care Staffing Health Inspection Overall  [facility] [address] [place] [phone] 2 1 2 1   [facility] [address] [place] [phone] 5 2 4 5   [facility] [address], [place] [phone] 2 1 1 1   [facility] [address], [place] [phone] 4 3 4 4   [facility]  [address], [place] [phone] 4 3 5 5   [facility] [address] [facility] [phone] 5 3 2 3   [facility] [address] [place] [phone] 5 1 2 2   [facility] [address] [place] [phone] 3 4 4 4   [address] ([place]) [address] [place] [phone] 1 3 3 2   [facility] [address] [place], [place] [phone] 4 2 2 2   [facility] ([place]) [address], [place] [phone] 2 1 1 1   Clotilda Pereyra [address] [place] [phone] 2 4 4 4   [facility] [address], [place] [phone] 3 1 3 2   [facility] ([place]) [address], [place] [phone] 1 2 4 3           [facility] [address] [place] [phone] 3 1 5 4   [facility] [address], [place] [phone] 4 2 1 1   [facility]  [address], [place] [phone] 2 4 1 1    [facility] [address] [phone] 2 2 5 5   [facility] [address] [place] [address], [place] [phone] 2 2 3 3           [facility] [address], [place] [phone] 2 1 2 1   [facility] (No UHC) [address], [place] [phone] 4 3 4 4   [facility] [address] [facility] [phone] 3  5 5   [facility] [address] [place] [phone] 4 2 1 1   [facility] [address] [place] [phone] 3 1 2 1   [facility] [address] [place] [phone] 1 3 3 2   [facility] [address] [place] [phone] 2 2 3 3   [facility] [address] [place], [place] [phone] 2 1 4 3   [facility] [address], [facility] [phone] 2 1 1 1   [facility] [address] [place], [place] [phone] 2 1 2 1   [facility] [address] [place] [phone] 3 1 2 1           [facility] [address] [place] [phone] 4 1 3 2   [facility] [address] [place]  [phone] 2 4 4 4   [facility] (No Humana) [address] [place] [phone] 3 2 5 5   [facility] [address], [place]  [phone] 3 2 3 3   [facility] [place] [address], [place] [phone] 5 3 5 5   [facility] [address], [place] [phone] 2 1 1 1   [facility] [address] [place], [place] [phone] 3 5 5 5           [facility] [address] [place], [place] [phone] 5 4 5 5   [facility])  [address] [place] [phone] 1 1 2 1   [facility]) [address] [place] [phone]  2 4 4   [facility] [address] [place] [phone] 3 5 5 5   [address] [place], [place] [phone] 4 2 2 2   [facility]) [address] [place] [phone] 1 1 3 1   [facility] [address], [place] [phone] 2 2 2 2      Expected Discharge Plan: Skilled Nursing Facility Barriers to Discharge: Continued Medical Work up   Patient Goals and CMS Choice Patient states their goals for this hospitalization and ongoing recovery are:: Return to SNF          Expected Discharge Plan and Services In-house Referral: Clinical Social Work   Post Acute Care Choice: Skilled Nursing Facility Living arrangements for the past 2 months: Skilled Nursing Facility                                      Prior Living Arrangements/Services Living arrangements for the past 2 months: Skilled Nursing Facility Lives with:: Facility Resident Patient language and need for interpreter reviewed:: Yes Do you feel safe going back to the place where you live?: Yes      Need for Family Participation in Patient Care: Yes (Comment) Care giver support system in place?: Yes (comment)   Criminal Activity/Legal Involvement Pertinent to Current Situation/Hospitalization: No - Comment as needed  Activities of Daily Living      Permission Sought/Granted Permission sought to share information with : Facility Medical Sales Representative, Family Supports Permission granted to share information with : No  Share Information with NAME: Keighley, Deckman- Mother [phone]  Permission granted to share info w AGENCY: [facility]        Emotional Assessment Appearance:: Appears stated age Attitude/Demeanor/Rapport: Unable to Assess Affect (typically observed): Unable to Assess Orientation: : Oriented to Self, Oriented to Place Alcohol / Substance Use: Not Applicable Psych Involvement: No (comment)  Admission diagnosis:  Altered mental status [R41.82] Acute encephalopathy [G93.40] Severe sepsis (HCC) [A41.9, R65.20] Patient Active Problem List   Diagnosis Date Noted   Altered mental status 02/14/2024   Obesity, class 2 06/09/2023   Acute on chronic diastolic CHF (congestive heart  failure) (HCC) 05/29/2023   Gastroparesis 05/29/2023   Chronic wound of left heel 05/29/2023   Amputation of right lower extremity below knee (HCC) 04/30/2023   Non-pressure chronic ulcer of other part of left foot limited to breakdown of skin (HCC) 04/30/2023   Dysphagia 04/28/2023   Volume overload 04/25/2023   Chronic diastolic CHF (congestive heart failure) (HCC) 04/25/2023   Carcinoid tumor of abdomen (HCC) 04/25/2023   PVD (peripheral vascular disease) 04/25/2023   History of GI bleed 04/25/2023   History of arteriovenous malformation (AVM) 04/25/2023   Generalized anxiety disorder 04/25/2023   History of reactive airway disease 04/25/2023   History of spinal stenosis 04/25/2023   Chronic diabetic ulcer of  left foot determined by examination (HCC) 04/25/2023   Diarrhea 03/01/2023   Intractable diarrhea 02/28/2023   Sinus tachycardia 02/17/2023   Cellulitis of heel, left 02/17/2023   Hx of BKA, right (HCC) 01/31/2023   Tardive dyskinesia 01/06/2023   Peutz-Jeghers polyps of small bowel (HCC) 12/24/2022   Gastric AVM 12/23/2022   Anemia of chronic disease 12/14/2022   Pressure injury of skin 11/30/2022   Non-pressure chronic ulcer of right heel and midfoot limited to breakdown of skin (HCC) 11/24/2022   Severe sepsis (HCC) 11/23/2022   ESRD on dialysis (HCC) 09/09/2022   Peripheral neuropathy 09/09/2022   (HFpEF) heart failure with preserved ejection fraction (HCC) 09/09/2022   CHF (congestive heart failure) (HCC) 07/13/2022   Hyponatremia 05/29/2022   Leukocytosis 05/11/2022   Edema of left upper extremity 03/21/2022   Acute pyelonephritis    Paroxysmal atrial fibrillation (HCC)    Chronic pain syndrome/chronic abdominal pain 09/04/2019   Malignant carcinoid tumor of duodenum (HCC)    NASH (nonalcoholic steatohepatitis) 06/05/2019   Chronic diarrhea with history of C.Diff    Restrictive lung disease secondary to obesity    History of gastric ulcer    Fibroid uterus  02/23/2019   Abdominal pain 07/17/2018   Anxiety 11/29/2017   Spinal stenosis, lumbar region with neurogenic claudication 08/03/2017   GERD (gastroesophageal reflux disease) 03/19/2017   Chronic depression/anxiety 03/19/2017   Morbid obesity (HCC)    Normocytic anemia 08/16/2016   Chronic gout 06/05/2016   Chronic hypokalemia 09/26/2015   Hypomagnesemia and hypokalemia 09/26/2015   Insulin  dependent type 2 diabetes mellitus (HCC) 05/25/2015   Essential hypertension 09/28/2013   PCP:  Freddrick, No Pharmacy:   [facility] - [place], [place] - [address] [place] [address] Phone: [phone] Fax: [phone]  [facility] [address] [place] [address] Phone: [phone] Fax: [phone]  [id number] [place], [place] - [address] AT [facility] [address] [place] [address] Phone: [phone] Fax: [phone]  [id number] - [address] ([place]), [place] - [address] [place] [address] Phone: [phone] Fax: [phone]     Social Drivers of Health (SDOH) Social History: SDOH Screenings   Food Insecurity: Patient Unable To Answer (02/18/2024)  Housing: Patient Declined (02/18/2024)  Transportation Needs: Patient Unable To Answer (02/18/2024)  Utilities: At Risk (02/18/2024)  Depression (PHQ2-9): Medium Risk (03/16/2023)  Financial Resource Strain: Low Risk (03/23/2021)   [facility])  Social Connections: Low Risk (03/23/2021)   [facility])  Stress: Low Risk (03/23/2021)   [facility] from [facility])  Tobacco Use: Low Risk (02/16/2024)   SDOH Interventions:     Readmission Risk Interventions    02/20/2024   11:05 AM 03/07/2023    4:19 PM 11/25/2022    5:16 PM  Readmission Risk Prevention Plan   Transportation Screening Complete Complete Complete  Medication Review [facility]) Complete Complete Complete  PCP or Specialist appointment within 3-5 days of discharge Complete Complete Complete  HRI or Home Care Consult Complete Complete Complete  SW Recovery Care/Counseling Consult Complete Complete Complete  Palliative Care Screening Not Applicable Complete Not Applicable  Skilled Nursing Facility Complete Not Applicable Not Applicable

## 2024-02-20 NOTE — Progress Notes (Signed)
 [facility] Progress Note   Subjective:    Seen and examined patient at bedside. She's alert and engaging with us . She denies any acute issues. Next HD 12/16.  Objective Vitals:   02/20/24 0433 02/20/24 0815 02/20/24 1146 02/20/24 1546  BP: 131/77 121/73 130/84 124/77  Pulse:  89 85 89  Resp:  14 11 15   Temp: 97.6 F (36.4 C) 97.9 F (36.6 C) 97.8 F (36.6 C) 98 F (36.7 C)  TempSrc: Oral Oral Oral Oral  SpO2:   99% 99%  Weight:      Height:       Physical Exam General: chronically ill appearing female in NAD Heart: RRR, no murmurs Lungs: CTA bilaterally, respirations unlabored on RA Abdomen: Soft, non-distended, +BS Extremities: no edema b/l lower extremities Dialysis Access: [facility]  Filed Weights   02/18/24 0115 02/18/24 1245 02/18/24 1703  Weight: 96.2 kg 95.5 kg 95.5 kg   No intake or output data in the 24 hours ending 02/20/24 1733  Additional Objective Labs: Basic Metabolic Panel: Recent Labs  Lab 02/19/24 0316 02/19/24 1813 02/20/24 0147  NA 133* 136 134*  K 3.4* 3.7 3.7  CL 96* 102 99  CO2 28 26 26   GLUCOSE 182* 195* 201*  BUN <5* 8 9  CREATININE 1.88* 2.55* 2.83*  CALCIUM  7.1* 7.4* 7.1*  PHOS 3.1 2.5 2.8   Liver Function Tests: Recent Labs  Lab 02/14/24 1402 02/15/24 0836 02/17/24 0257 02/19/24 0316 02/19/24 1813 02/20/24 0147  AST 13* 10*  --   --   --   --   ALT 10 8  --   --   --   --   ALKPHOS 109 87  --   --   --   --   BILITOT 0.9 0.9  --   --   --   --   PROT 6.3* 4.8*  --   --   --   --   ALBUMIN  1.7* <1.5*   < > 1.8* 1.7* 1.6*   < > = values in this interval not displayed.   No results for input(s): LIPASE, AMYLASE in the last 168 hours. CBC: Recent Labs  Lab 02/15/24 0836 02/16/24 0412 02/17/24 0257 02/17/24 1415 02/18/24 0321 02/19/24 0316 02/20/24 0147  WBC 35.0* 28.8* 31.2*  --  24.6* 18.5* 19.9*  NEUTROABS 29.8*  --   --   --   --   --   --   HGB 8.1* 8.3* 7.6*   < > 7.8* 7.5* 7.9*  HCT 25.3*  25.4* 23.5*   < > 23.7* 23.2* 24.9*  MCV 92.7 88.8 89.4  --  86.2 86.2 87.4  PLT 183 166 178  --  173 182 212   < > = values in this interval not displayed.   Blood Culture    Component Value Date/Time   SDES FLUID 02/16/2024 1430   SPECREQUEST LEFT THIGH ABSCESS 02/16/2024 1430   CULT  02/16/2024 1430    MODERATE PROTEUS MIRABILIS FEW ENTEROCOCCUS AVIUM FEW PREVOTELLA BUCCAE BETA LACTAMASE POSITIVE Performed at [facility], [address] [place], [place] [address]    REPTSTATUS 02/20/2024 FINAL 02/16/2024 1430    Cardiac Enzymes: No results for input(s): CKTOTAL, CKMB, CKMBINDEX, TROPONINI in the last 168 hours. CBG: Recent Labs  Lab 02/20/24 0012 02/20/24 0436 02/20/24 0823 02/20/24 1150 02/20/24 1546  GLUCAP 213* 204* 206* 221* 180*   Iron  Studies: No results for input(s): IRON , TIBC, TRANSFERRIN, FERRITIN in the last  72 hours. Lab Results  Component Value Date   INR 1.5 (H) 03/22/2023   INR 1.9 (H) 12/15/2022   INR 1.8 (H) 11/23/2022   Studies/Results: EEG adult Result Date: 02/19/2024 Deborah Arlin KIDD, MD     02/19/2024 11:13 AM Patient Name: Deborah [name] MRN: [id number] Epilepsy Attending: Arlin [name] Deborah Referring Physician/Provider: Sherlon Brayton RAMAN, MD Date: 02/19/2024 Duration: 23.54 mins Patient history: 58yo F with ams. EEG to evaluate for seizure Level of alertness: Awake AEDs during EEG study: None Technical aspects: This EEG study was done with scalp electrodes positioned according to the 10-20 International system of electrode placement. Electrical activity was reviewed with band pass filter of 1-70Hz , sensitivity of 7 uV/mm, display speed of 71mm/sec with a 60Hz  notched filter applied as appropriate. EEG data were recorded continuously and digitally stored.  Video monitoring was available and reviewed as appropriate. Description: EEG showed continuous generalized polymorphic sharply contoured 3 to 6 Hz theta-delta  slowing. Hyperventilation and photic stimulation were not performed.   ABNORMALITY - Continuous slow, generalized IMPRESSION: This study is suggestive of generalized cerebral dysfunction ( encephalopathy). No seizures or epileptiform discharges were seen throughout the recording. Deborah [name]    Medications:  [START ON 02/21/2024]  ceFAZolin  (ANCEF ) IV     feeding supplement (OSMOLITE 1.5 CAL) 1,000 mL (02/20/24 0526)   vancomycin  2,000 mg (02/20/24 1618)   Followed by   NOREEN ON 02/21/2024] vancomycin       allopurinol   100 mg Per Tube BID   atorvastatin   10 mg Per Tube QPM   cholestyramine  light  4 g Per Tube BID   famotidine   20 mg Per Tube QAC breakfast   ferric citrate   420 mg Oral TID WC   folic acid   1 mg Per Tube Daily   Gerhardt's butt cream   Topical TID   heparin   5,000 Units Subcutaneous Q8H   hydrocerin  1 Application Topical BID   hyoscyamine   0.125 mg Sublingual TID   insulin  aspart  0-6 Units Subcutaneous Q4H   metroNIDAZOLE   500 mg Oral Q12H   midodrine   10 mg Per Tube TID   pantoprazole   40 mg Oral Daily   thiamine   200 mg Per Tube Daily    Dialysis Orders: [place] [facility] 4:00 400BFR EDW 96.1 kg 3K/2.5Ca TDC No bolus heparin    Assessment/Plan: AMS/ possible sepsis: WBC remain elevated, on antibiotics per primary team. Changed from cefepime  to meropenem .  L thigh abscess. Surgery consulted, s/p I&D on 02/16/24 ESRD: on HD TTS. HD Tuesday per regular schedule. Cr and BUN are now low but no UOP? Unclear why BUN would be so low. Will continue to follow.  Hypotension.  Has chronic low BP's takes midodrine  pre HD three times per week. Continue here.  Volume: bp's soft, no pitting edema. No UF with HD today Anemia. Hgb 7.5,  Mircera 225  given 12/6 at OP clinic, not due for next dose yet.  Hypokalemia/hypophos: After tube feeds initiated, receiving supplementation.   Deborah Piety, NP [facility] 02/20/2024,5:33 PM  LOS: 6 days

## 2024-02-20 NOTE — Plan of Care (Signed)
°  Problem: Coping: Goal: Ability to adjust to condition or change in health will improve Outcome: Progressing   Problem: Nutritional: Goal: Maintenance of adequate nutrition will improve Outcome: Progressing   Problem: Skin Integrity: Goal: Risk for impaired skin integrity will decrease Outcome: Progressing   Problem: Tissue Perfusion: Goal: Adequacy of tissue perfusion will improve Outcome: Progressing   Problem: Fluid Volume: Goal: Hemodynamic stability will improve Outcome: Progressing   Problem: Clinical Measurements: Goal: Diagnostic test results will improve Outcome: Progressing

## 2024-02-20 NOTE — Inpatient Diabetes Management (Signed)
 Inpatient Diabetes Program Recommendations  AACE/ADA: New Consensus Statement on Inpatient Glycemic Control (2015)  Target Ranges:  Prepandial:   less than 140 mg/dL      Peak postprandial:   less than 180 mg/dL (1-2 hours)      Critically ill patients:  140 - 180 mg/dL   Lab Results  Component Value Date   GLUCAP 206 (H) 02/20/2024   HGBA1C 4.3 (L) 02/15/2024    Review of Glycemic Control  Latest Reference Range & Units 02/19/24 15:39 02/19/24 20:30 02/19/24 21:36 02/20/24 00:12 02/20/24 04:36 02/20/24 08:23  Glucose-Capillary 70 - 99 mg/dL 819 (H) 828 (H) 820 (H) 213 (H) 204 (H) 206 (H)   Diabetes history: None Current orders for Inpatient glycemic control:  Osmolite 55 ml/hr Novolog  0-6 units tid with meals  Inpatient Diabetes Program Recommendations:    Please consider increasing frequency of Novolog  correction to q 4 hours.   Thanks,  Randall Bullocks, RN, BC-ADM Inpatient Diabetes Coordinator Pager [phone]  (8a-5p)

## 2024-02-20 NOTE — Progress Notes (Signed)
 Nutrition Follow-up  DOCUMENTATION CODES:  Not applicable  INTERVENTION:  Tube feeds via Cortrak: Osmolite 1.5 at 55 mL/hr (1320 mL per day) Provides 1980 calories, 83 gm protein, and 1006 mL free water daily. Encourage good PO intake Continue Thiamine , Folic acid , Multivitamin w/ minerals daily  NUTRITION DIAGNOSIS:  Inadequate oral intake related to inability to eat as evidenced by NPO status. - Progressing, now on diet   GOAL: Patient will meet greater than or equal to 90% of their needs - Being met via TF  MONITOR:  TF tolerance, Diet advancement, Labs  REASON FOR ASSESSMENT:  Consult Enteral/tube feeding initiation and management  ASSESSMENT:  Pt with PMH significant for: ESRD-HD, paroxysmal afib, T2DM, chronic pain, HLD, R BKA, HTN, gastroparesis, and stroke. Presented to ED from HD clinic with AMS, lethargy, hypotensive and tachycardic. Admitted r/t acute metabolic encephalopathy in the setting of severe sepsis 2/2 left thigh/pelvic abscess w/ possible fasciitis. Noted with left sided deficits r/t prior stroke, at baseline.  12/10 - admitted 12/11 - OR: I&D of L thigh abscess; downgraded to NPO 12/12 - Cortrak placed, TF started; central line placed  12/15 - diet advanced to regular  Patient laying in bed at time of visit. Patient drowsy and only wakes briefly to RD questions. Tube feeds running at goal at time of RD visit. MD asked RD about decreasing feeds and removing Cortrak soon. Discussed leaving and starting nocturnal in setting of increased needs for wound healing. No meal intakes have been recorded. Will continue to monitor PO intake for improvements prior to adjusting tube feed regimen.   Admission Weight: 96.2 kg Current Weight: 95.5 kg  Nutrition Related Medications: Pepcid , Ferric Citrate , Folic Acid , NovoLog  0-6 units q4h, Protonix , Thiamine   Drips  Linezolid  Merrem  Labs: Sodium 134, Potassium 3.7, BUN 9, Creatinine 2.83, Phosphorus 2.8  CBG: 171-221  mg/dL x 24 hrs   Diet Order:   Diet Order             Diet regular Room service appropriate? Yes; Fluid consistency: Thin  Diet effective now                  EDUCATION NEEDS:  Not appropriate for education at this time  Skin:  Skin Assessment: Reviewed RN Assessment  Last BM:  12/14 - Type 7 (large)  Height:  Ht Readings from Last 1 Encounters:  02/16/24 5' 6 (1.676 m)   Weight:  Wt Readings from Last 1 Encounters:  02/18/24 95.5 kg   Ideal Body Weight:  55.5 kg  BMI:  Body mass index is 33.98 kg/m.  Estimated Nutritional Needs:  Kcal:  1800-2000 kcal Protein:  85-100g Fluid:  1L + UOP   Nestora Glatter RD, LDN Registered Dietitian I Please see AMION for contact information

## 2024-02-20 NOTE — Progress Notes (Signed)
 PHARMACY ANTIBIOTIC CONSULT NOTE   Deborah [name] a 58 y.o. female with thigh abscess s/p I and D 12/11 .  Pharmacy has been consulted for Vancomycin  dosing. Patient is ESRD on HD TTS.   WBC 19.9 (02/20/2024)   Estimated Creatinine Clearance: 24.9 mL/min (A) (by C-G formula based on SCr of 2.83 mg/dL (H)).  Plan: Cefazolin  2g once, then TTS with HD  GIVE Vancomycin  2,000 mg IV x1  THEN Vancomycin  1,000 mg IV with HD TTS START metronidazole  500 mg PO Q12H  F/U abx DOT   Allergies:  Allergies[1]  Filed Weights   02/18/24 0115 02/18/24 1245 02/18/24 1703  Weight: 96.2 kg (212 lb 1.3 oz) 95.5 kg (210 lb 8.6 oz) 95.5 kg (210 lb 8.6 oz)       Latest Ref Rng & Units 02/20/2024    1:47 AM 02/19/2024    3:16 AM 02/18/2024    3:21 AM  CBC  WBC 4.0 - 10.5 K/uL 19.9  18.5  24.6   Hemoglobin 12.0 - 15.0 g/dL 7.9  7.5  7.8   Hematocrit 36.0 - 46.0 % 24.9  23.2  23.7   Platelets 150 - 400 K/uL 212  182  173     Antibiotics Given (last 72 hours)     Date/Time Action Medication Dose Rate   02/18/24 0321 New Bag/Given  [at dialysis]   linezolid  (ZYVOX ) IVPB 600 mg 600 mg 300 mL/hr   02/18/24 0837 New Bag/Given   linezolid  (ZYVOX ) IVPB 600 mg 600 mg 300 mL/hr   02/18/24 1917 New Bag/Given   meropenem  (MERREM ) 500 mg in sodium chloride  0.9 % 100 mL IVPB 500 mg 200 mL/hr   02/18/24 2249 New Bag/Given   linezolid  (ZYVOX ) IVPB 600 mg 600 mg 300 mL/hr   02/19/24 0919 New Bag/Given   linezolid  (ZYVOX ) IVPB 600 mg 600 mg 300 mL/hr   02/19/24 1808 New Bag/Given   meropenem  (MERREM ) 500 mg in sodium chloride  0.9 % 100 mL IVPB 500 mg 200 mL/hr   02/19/24 2153 New Bag/Given   linezolid  (ZYVOX ) IVPB 600 mg 600 mg 300 mL/hr   02/20/24 1002 New Bag/Given   linezolid  (ZYVOX ) IVPB 600 mg 600 mg 300 mL/hr       Antimicrobials this admission: 12/9 cefepime >>12/10 12/9 flagyl >>12/11; 12/15>> 12/9 vanc>>12/10; 12/15>> 12/12 linezolid >>12/15 12/12 merrem >>12/15 12/15  cefazolin >>  Microbiology results: 12/11 blood>>ngtd 12/9 blood x1 set>>ngtd 12/11 abscess>> Pan-sens proteus mirabilis, enterococcus avium, prevotella buccae, GPR   Thank you for allowing pharmacy to be a part of this patients care.  Massie Fila, PharmD Clinical Pharmacist  02/20/2024 2:47 PM        [1]  Allergies Allergen Reactions   Gabapentin  Hives and [name]   Isovue  [Iopamidol ] Anaphylaxis, [name] and Other (See Comments)    11/28/17 Patient had seizure like activity and then 1 min code after 100 cc of isovue  300. Possible contrast allergy vs vasovagal episode  Cardiac Arrest   Nsaids Anaphylaxis and Other (See Comments)    Hx of stomach ulcers   Penicillins Itching, Palpitations and Other (See Comments)    Flushing (Red Skin) Laryngeal Edema   Reglan  [Metoclopramide ] Other (See Comments)    Tardive dyskinesia    Valium  [Diazepam ] [name]   Zestril [Lisinopril] Anaphylaxis and Swelling    Tongue and mouth swelling Laryngeal Edema   Tolectin [Tolmetin] Nausea And Vomiting, Nausea Only and Other (See Comments)    Irritates stomach ulcer   Dorethia [Aspirin ] Other (  See Comments)    Hx of stomach ulcer   Aspartame And Phenylalanine Hives   Bentyl  [Dicyclomine ] Other (See Comments)    Chest pain   Hibiclens  [Chlorhexidine  Gluconate] Other (See Comments)    Dermatitis    Flexeril  [Cyclobenzaprine ] Palpitations   Oxycontin  [Oxycodone ] Palpitations   Rifamycins Palpitations   Tylenol  [Acetaminophen ] Nausea And Vomiting, Nausea Only and Other (See Comments)    Irritates stomach ulcer Abdominal pain   Ultram  [Tramadol ] Nausea And Vomiting and Palpitations

## 2024-02-20 NOTE — Progress Notes (Signed)
 PROGRESS NOTE   Deborah [name]  FMW:[id number]    DOB: [birth year]    DOA: 02/14/2024  PCP: Pcp, No   I have briefly reviewed patients previous medical records in [facility].   Brief Hospital Course:   58 year old female with medical history significant for ESRD on HD, paroxysmal A-fib, DM2, chronic pain, HLD, R BKA, presented to the ED via EMS from her dialysis center with altered mental status, lethargy, alert and oriented x 1, slightly hypotensive and tachycardic, reportedly did not get dialysis, had left-sided deficits from prior stroke.  Admitted for acute metabolic encephalopathy of unclear etiology, remained altered 12/10.  Workup significant for severe sepsis, she was found to have significant left thigh abscess, went to the OR 12/11.   Assessment & Plan:   Acute metabolic encephalopathy - Due to sepsis -CT head with no acute findings -MRI brain with no acute finding - avoid cefepime , now on meropenem  - Ammonia within normal limit - EEG with no seizure activities - Continue with thiamine  - Tube feed - Initial VBG significant for CO2 retention and acidosis, but appears to be an error as repeat ABG no CO2 retention, PCCM input greatly appreciated - MRI brain with no acute findings. - Resolved, mentation back to baseline today.  Severe sepsis, left thigh - due to thigh, left groin abscess -The abdomen/pelvis and left thigh area noted. - Continue with broad-spectrum empiric antibiotic coverage.  vancomycin , cefepime  and Flagyl  changed to meropenem  and Zyvox  - Surgery consulted, status post I&D 12/11 by Dr. Vernetta  - Follow on intraoperative cultures, growing Proteus Mirabella's (appears pansensitive, but continue with current broad-spectrum coverage meropenem  and Zyvox  as still pending for gram-positive rods and cocci) -continue with wound care   ESRD  - Renal consulted, HD per renal  Diabetes mellitus type 2 Well-controlled based on prior hemoglobin A1c from  2024 of 5.7%. - She is on tube feed currently, and will start on diet as well today, will change insulin  sliding scale to every 4 hours.   Chronic hypotension Complicated by hemodialysis. Patient is on midodrine  before hemodialysis as an outpatient.   - Continue with midodrine  - Deceived IV albumin    Hyperlipidemia Noted.  Resume Lipitor   History of paroxysmal atrial fibrillation Noted.  Patient does not have anticoagulation or rate control medication list on home medication list.  Patient is currently in sinus rhythm.   Gout Allopurinol    Depression Anxiety Patient is listed as taking BuSpar , risperidone , trazodone , Remeron .  Holding most of these medications at this time until further improvement and reassessment of mental status.   History of nausea vomiting Patient without current symptoms.  Obesity class I Severe hypophosphatemia Hypokalemia PCM Hypoalbuminemia - Body mass index is 33.98 kg/m. - Appears to be severely malnourished, started on  cortrak tube feed - High risk for refeeding syndrome, phosphorus and potassium , lites monitored and replaced regularly  - Has improved this morning, she is started on regular diet, attrition consulted  Anemia of ESRD Received 1 unit PRBC 12/12 specially with hypotension  Body mass index is 33.98 kg/m.   DVT prophylaxis: heparin  injection 5,000 Units Start: 02/14/24 2315     Code Status: Full Code:  Family Communication: None at bedside today Disposition:  Status is: Inpatient Remains inpatient appropriate because: Ongoing AMS.     Consultants:   Nephrology General Surgery PCCM  Procedures:   Hemodialysis Left thigh abscess I&D by Dr. Vernetta 02/16/2024 Left subclavian TLC placement by PCCM 02/17/2024   Subjective:  Mentation much improved, she is requesting her diet to be regular, reports some diarrhea requesting some Imodium , were requesting Goodheart Butt cream Objective:   Vitals:   02/20/24 0010  02/20/24 0433 02/20/24 0815 02/20/24 1146  BP: 117/72 131/77 121/73 130/84  Pulse:   89 85  Resp:   14 11  Temp: 97.7 F (36.5 C) 97.6 F (36.4 C) 97.9 F (36.6 C) 97.8 F (36.6 C)  TempSrc: Oral Oral Oral Oral  SpO2:    99%  Weight:      Height:        Awake, alert, oriented x 3, appropriate and pleasant Good air entry Regular rate and rhythm  Abdomen soft  Left groin bandaged  Right BKA     Data Reviewed:   I have personally reviewed following labs and imaging studies   CBC: Recent Labs  Lab 02/15/24 0836 02/16/24 0412 02/18/24 0321 02/19/24 0316 02/20/24 0147  WBC 35.0*   < > 24.6* 18.5* 19.9*  NEUTROABS 29.8*  --   --   --   --   HGB 8.1*   < > 7.8* 7.5* 7.9*  HCT 25.3*   < > 23.7* 23.2* 24.9*  MCV 92.7   < > 86.2 86.2 87.4  PLT 183   < > 173 182 212   < > = values in this interval not displayed.    Basic Metabolic Panel: Recent Labs  Lab 02/14/24 1402 02/14/24 1413 02/17/24 0257 02/17/24 1415 02/18/24 0321 02/18/24 1839 02/19/24 0316 02/19/24 1813 02/20/24 0147  NA 135   < > 135   < > 133* 133* 133* 136 134*  K 4.1   < > 3.8   < > 3.0* 3.6 3.4* 3.7 3.7  CL 96*   < > 99  --  96* 98 96* 102 99  CO2 27   < > 27  --  28 29 28 26 26   GLUCOSE 171*   < > 103*  --  139* 104* 182* 195* 201*  BUN 21*   < > 18  --  8 <5* <5* 8 9  CREATININE 6.36*   < > 4.76*  --  2.77* 1.43* 1.88* 2.55* 2.83*  CALCIUM  8.8*   < > 7.9*  --  7.4* 7.2* 7.1* 7.4* 7.1*  MG 1.8  --  1.6*  --   --   --   --  1.9  --   PHOS  --    < > 2.3*  --  1.1* 1.1* 3.1 2.5 2.8   < > = values in this interval not displayed.    Liver Function Tests: Recent Labs  Lab 02/14/24 1402 02/15/24 0836 02/17/24 0257 02/18/24 0321 02/18/24 1839 02/19/24 0316 02/19/24 1813 02/20/24 0147  AST 13* 10*  --   --   --   --   --   --   ALT 10 8  --   --   --   --   --   --   ALKPHOS 109 87  --   --   --   --   --   --   BILITOT 0.9 0.9  --   --   --   --   --   --   PROT 6.3* 4.8*  --   --    --   --   --   --   ALBUMIN  1.7* <1.5*   < > 1.8* 2.0* 1.8* 1.7* 1.6*   < > =  values in this interval not displayed.    CBG: Recent Labs  Lab 02/20/24 0436 02/20/24 0823 02/20/24 1150  GLUCAP 204* 206* 221*    Microbiology Studies:   Recent Results (from the past 240 hours)  Culture, blood (single)     Status: None   Collection Time: 02/14/24  1:10 PM   Specimen: BLOOD RIGHT FOREARM  Result Value Ref Range Status   Specimen Description BLOOD RIGHT FOREARM  Final   Special Requests   Final    BOTTLES DRAWN AEROBIC AND ANAEROBIC Blood Culture results may not be optimal due to an inadequate volume of blood received in culture bottles   Culture   Final    NO GROWTH 5 DAYS Performed at [facility], [address] [place], [place] [address]    Report Status 02/19/2024 FINAL  Final  Resp panel by RT-PCR (RSV, Flu A&B, Covid) Anterior Nasal Swab     Status: None   Collection Time: 02/14/24  3:23 PM   Specimen: Anterior Nasal Swab  Result Value Ref Range Status   SARS Coronavirus 2 by RT PCR NEGATIVE NEGATIVE Final   Influenza A by PCR NEGATIVE NEGATIVE Final   Influenza B by PCR NEGATIVE NEGATIVE Final    Comment: (NOTE) The Xpert Xpress SARS-CoV-2/FLU/RSV plus assay is intended as an aid in the diagnosis of influenza from Nasopharyngeal swab specimens and should not be used as a sole basis for treatment. Nasal washings and aspirates are unacceptable for Xpert Xpress SARS-CoV-2/FLU/RSV testing.  Fact Sheet for Patients: bloggercourse.com  Fact Sheet for Healthcare Providers: seriousbroker.it  This test is not yet approved or cleared by the [place]  FDA and has been authorized for detection and/or diagnosis of SARS-CoV-2 by FDA under an Emergency Use Authorization (EUA). This EUA will remain in effect (meaning this test can be used) for the duration of the COVID-19 declaration under Section 564(b)(1) of the Act,  21 U.S.C. section 360bbb-3(b)(1), unless the authorization is terminated or revoked.     Resp Syncytial Virus by PCR NEGATIVE NEGATIVE Final    Comment: (NOTE) Fact Sheet for Patients: bloggercourse.com  Fact Sheet for Healthcare Providers: seriousbroker.it  This test is not yet approved or cleared by the [place]  FDA and has been authorized for detection and/or diagnosis of SARS-CoV-2 by FDA under an Emergency Use Authorization (EUA). This EUA will remain in effect (meaning this test can be used) for the duration of the COVID-19 declaration under Section 564(b)(1) of the Act, 21 U.S.C. section 360bbb-3(b)(1), unless the authorization is terminated or revoked.  Performed at [facility], [address], [place], [place] [address]   Culture, blood (Routine X 2) w Reflex to ID Panel     Status: None (Preliminary result)   Collection Time: 02/16/24  4:12 AM   Specimen: BLOOD RIGHT HAND  Result Value Ref Range Status   Specimen Description BLOOD RIGHT HAND  Final   Special Requests   Final    BOTTLES DRAWN AEROBIC ONLY Blood Culture results may not be optimal due to an inadequate volume of blood received in culture bottles   Culture   Final    NO GROWTH 4 DAYS Performed at [facility], [address] [place], [place] [address]    Report Status PENDING  Incomplete  Culture, blood (Routine X 2) w Reflex to ID Panel     Status: None (Preliminary result)   Collection Time: 02/16/24  4:14 AM   Specimen: BLOOD RIGHT  HAND  Result Value Ref Range Status   Specimen Description BLOOD RIGHT HAND  Final   Special Requests   Final    BOTTLES DRAWN AEROBIC ONLY Blood Culture results may not be optimal due to an inadequate volume of blood received in culture bottles   Culture   Final    NO GROWTH 4 DAYS Performed at [facility], [address], [place], [place] [address]    Report Status PENDING  Incomplete   Aerobic/Anaerobic Culture w Gram Stain (surgical/deep wound)     Status: None (Preliminary result)   Collection Time: 02/16/24  2:30 PM   Specimen: Path fluid; Body Fluid  Result Value Ref Range Status   Specimen Description FLUID  Final   Special Requests LEFT THIGH ABSCESS  Final   Gram Stain   Final    ABUNDANT WBC PRESENT, PREDOMINANTLY PMN RARE GRAM NEGATIVE RODS FEW GRAM POSITIVE RODS RARE GRAM POSITIVE COCCI    Culture   Final    MODERATE PROTEUS MIRABILIS FEW ENTEROCOCCUS AVIUM SUSCEPTIBILITIES TO FOLLOW FEW PREVOTELLA BUCCAE BETA LACTAMASE POSITIVE Performed at [facility], [address], [place], [place] [address]    Report Status PENDING  Incomplete   Organism ID, Bacteria PROTEUS MIRABILIS  Final      Susceptibility   Proteus mirabilis - MIC*    AMPICILLIN <=2 SENSITIVE Sensitive     CEFAZOLIN  (NON-URINE) 2 SENSITIVE Sensitive     CEFEPIME  <=0.12 SENSITIVE Sensitive     ERTAPENEM <=0.12 SENSITIVE Sensitive     CEFTRIAXONE  <=0.25 SENSITIVE Sensitive     CIPROFLOXACIN  <=0.06 SENSITIVE Sensitive     GENTAMICIN <=1 SENSITIVE Sensitive     MEROPENEM  0.5 SENSITIVE Sensitive     TRIMETH/SULFA <=20 SENSITIVE Sensitive     AMPICILLIN/SULBACTAM <=2 SENSITIVE Sensitive     PIP/TAZO Value in next row Sensitive      <=4 SENSITIVEThis is a modified FDA-approved test that has been validated and its performance characteristics determined by the reporting laboratory.  This laboratory is certified under the Clinical Laboratory Improvement Amendments CLIA as qualified to perform high complexity clinical laboratory testing.    * MODERATE PROTEUS MIRABILIS    Radiology Studies:  EEG adult Result Date: 03-09-24 Shelton Arlin KIDD, MD     03/09/2024 11:13 AM Patient Name: Deborah [name] MRN: [id number] Epilepsy Attending: Arlin KIDD Shelton Referring Physician/Provider: Sherlon Brayton RAMAN, MD Date: 2024-03-09 Duration: 23.54 mins Patient history: 58yo F with ams. EEG to  evaluate for seizure Level of alertness: Awake AEDs during EEG study: None Technical aspects: This EEG study was done with scalp electrodes positioned according to the 10-20 International system of electrode placement. Electrical activity was reviewed with band pass filter of 1-70Hz , sensitivity of 7 uV/mm, display speed of 72mm/sec with a 60Hz  notched filter applied as appropriate. EEG data were recorded continuously and digitally stored.  Video monitoring was available and reviewed as appropriate. Description: EEG showed continuous generalized polymorphic sharply contoured 3 to 6 Hz theta-delta slowing. Hyperventilation and photic stimulation were not performed.   ABNORMALITY - Continuous slow, generalized IMPRESSION: This study is suggestive of generalized cerebral dysfunction ( encephalopathy). No seizures or epileptiform discharges were seen throughout the recording. [name]    Scheduled Meds:    allopurinol   100 mg Per Tube BID   atorvastatin   10 mg Per Tube QPM   cholestyramine  light  4 g Per Tube BID   famotidine   20 mg Per Tube QAC breakfast  ferric citrate   420 mg Oral TID WC   folic acid   1 mg Per Tube Daily   Gerhardt's butt cream   Topical TID   heparin   5,000 Units Subcutaneous Q8H   hydrocerin  1 Application Topical BID   hyoscyamine   0.125 mg Sublingual TID   insulin  aspart  0-6 Units Subcutaneous TID WC   midodrine   10 mg Per Tube TID   pantoprazole   40 mg Oral Daily   thiamine   200 mg Per Tube Daily    Continuous Infusions:    feeding supplement (OSMOLITE 1.5 CAL) 1,000 mL (02/20/24 0526)   linezolid  (ZYVOX ) IV 600 mg (02/20/24 1002)   meropenem  (MERREM ) IV 500 mg (02/19/24 1808)     LOS: 6 days     Brayton Lye, MD,     To contact the attending provider between 7A-7P or the covering provider during after hours 7P-7A, please log into the web site [email or web address] and access using universal [place] password for that web site. If you do not have the  password, please call the hospital operator.  02/20/2024, 12:08 PM

## 2024-02-20 NOTE — Progress Notes (Addendum)
 Progress Note  4 Days Post-Op  Subjective:  Alert, conversant. Denies previous history of abscess. Does not recall having L thigh pain before she got here. States she takes PO dilaudid  for pain at baseline. States fentanyl  does not work and she is allergic to other narcotic meds.  Reports pain all over, as well as swelling in her right middle finger.   Objective: Vital signs in last 24 hours: Temp:  [97.6 F (36.4 C)-98.2 F (36.8 C)] 97.6 F (36.4 C) (12/15 0433) Pulse Rate:  [92-100] 92 (12/14 1950) Resp:  [16-20] 16 (12/14 1950) BP: (100-131)/(63-77) 131/77 (12/15 0433) SpO2:  [97 %-100 %] 100 % (12/14 1950) Last BM Date : 02/19/24  Intake/Output from previous day: No intake/output data recorded. Intake/Output this shift: No intake/output data recorded.  PE: General: alert, NAD, cortrak in place  HEENT: Head is normocephalic, atraumatic.  Heart:  Normal HR during encounter.  Skin: dry, left thigh wound as below - wound base pink and moist with some fibrinopurulent exudate. Inferior wound margin with some induration, cellulitis is improving    Lab Results:  Recent Labs    02/19/24 0316 02/20/24 0147  WBC 18.5* 19.9*  HGB 7.5* 7.9*  HCT 23.2* 24.9*  PLT 182 212   BMET Recent Labs    02/19/24 1813 02/20/24 0147  NA 136 134*  K 3.7 3.7  CL 102 99  CO2 26 26  GLUCOSE 195* 201*  BUN 8 9  CREATININE 2.55* 2.83*  CALCIUM  7.4* 7.1*   PT/INR No results for input(s): LABPROT, INR in the last 72 hours. CMP     Component Value Date/Time   NA 134 (L) 02/20/2024 0147   NA 137 09/20/2019 1530   K 3.7 02/20/2024 0147   CL 99 02/20/2024 0147   CO2 26 02/20/2024 0147   GLUCOSE 201 (H) 02/20/2024 0147   BUN 9 02/20/2024 0147   BUN 18 09/20/2019 1530   CREATININE 2.83 (H) 02/20/2024 0147   CALCIUM  7.1 (L) 02/20/2024 0147   PROT 4.8 (L) 02/15/2024 0836   ALBUMIN  1.6 (L) 02/20/2024 0147   AST 10 (L) 02/15/2024 0836   ALT 8 02/15/2024 0836   ALKPHOS 87  02/15/2024 0836   BILITOT 0.9 02/15/2024 0836   GFRNONAA 19 (L) 02/20/2024 0147   GFRAA 49 (L) 11/30/2019 0356   Lipase     Component Value Date/Time   LIPASE 24 06/03/2023 0905       Studies/Results: EEG adult Result Date: 02/19/2024 Shelton Arlin KIDD, MD     02/19/2024 11:13 AM Patient Name: Deborah [name] MRN: [id number] Epilepsy Attending: Arlin KIDD Shelton Referring Physician/Provider: Sherlon Brayton RAMAN, MD Date: 02/19/2024 Duration: 23.54 mins Patient history: 58yo F with ams. EEG to evaluate for seizure Level of alertness: Awake AEDs during EEG study: None Technical aspects: This EEG study was done with scalp electrodes positioned according to the 10-20 International system of electrode placement. Electrical activity was reviewed with band pass filter of 1-70Hz , sensitivity of 7 uV/mm, display speed of 75mm/sec with a 60Hz  notched filter applied as appropriate. EEG data were recorded continuously and digitally stored.  Video monitoring was available and reviewed as appropriate. Description: EEG showed continuous generalized polymorphic sharply contoured 3 to 6 Hz theta-delta slowing. Hyperventilation and photic stimulation were not performed.   ABNORMALITY - Continuous slow, generalized IMPRESSION: This study is suggestive of generalized cerebral dysfunction ( encephalopathy). No seizures or epileptiform discharges were seen throughout the recording. [name]    Anti-infectives:  Anti-infectives (From admission, onward)    Start     Dose/Rate Route Frequency Ordered Stop   02/18/24 1800  meropenem  (MERREM ) 500 mg in sodium chloride  0.9 % 100 mL IVPB        500 mg 200 mL/hr over 30 Minutes Intravenous Every 24 hours 02/17/24 0941     02/17/24 1030  meropenem  (MERREM ) 500 mg in sodium chloride  0.9 % 100 mL IVPB        500 mg 200 mL/hr over 30 Minutes Intravenous  Once 02/17/24 0941 02/17/24 1059   02/17/24 1000  meropenem  (MERREM ) 500 mg in sodium chloride  0.9 % 100 mL  IVPB  Status:  Discontinued        500 mg 200 mL/hr over 30 Minutes Intravenous Every 24 hours 02/16/24 1045 02/17/24 0941   02/16/24 1145  linezolid  (ZYVOX ) IVPB 600 mg        600 mg 300 mL/hr over 60 Minutes Intravenous Every 12 hours 02/16/24 1045     02/16/24 1145  meropenem  (MERREM ) 500 mg in sodium chloride  0.9 % 100 mL IVPB  Status:  Discontinued        500 mg 200 mL/hr over 30 Minutes Intravenous  Once 02/16/24 1045 02/17/24 0941   02/15/24 2000  ceFEPIme  (MAXIPIME ) 1 g in sodium chloride  0.9 % 100 mL IVPB  Status:  Discontinued        1 g 200 mL/hr over 30 Minutes Intravenous Every 24 hours 02/14/24 1649 02/16/24 1045   02/15/24 1700  vancomycin  (VANCOCIN ) IVPB 1000 mg/200 mL premix        1,000 mg 200 mL/hr over 60 Minutes Intravenous  Once 02/15/24 1528 02/15/24 2015   02/15/24 1415  vancomycin  variable dose per unstable renal function (pharmacist dosing)  Status:  Discontinued         Does not apply See admin instructions 02/15/24 1415 02/16/24 1045   02/15/24 0600  metroNIDAZOLE  (FLAGYL ) IVPB 500 mg  Status:  Discontinued        500 mg 100 mL/hr over 60 Minutes Intravenous Every 12 hours 02/14/24 2305 02/16/24 1046   02/14/24 1445  aztreonam  (AZACTAM ) 2 g in sodium chloride  0.9 % 100 mL IVPB  Status:  Discontinued        2 g 200 mL/hr over 30 Minutes Intravenous  Once 02/14/24 1435 02/14/24 1440   02/14/24 1445  metroNIDAZOLE  (FLAGYL ) IVPB 500 mg        500 mg 100 mL/hr over 60 Minutes Intravenous  Once 02/14/24 1435 02/14/24 1844   02/14/24 1445  vancomycin  (VANCOCIN ) IVPB 1000 mg/200 mL premix  Status:  Discontinued        1,000 mg 200 mL/hr over 60 Minutes Intravenous  Once 02/14/24 1435 02/14/24 1444   02/14/24 1445  ceFEPIme  (MAXIPIME ) 2 g in sodium chloride  0.9 % 100 mL IVPB        2 g 200 mL/hr over 30 Minutes Intravenous  Once 02/14/24 1444 02/14/24 1606   02/14/24 1445  vancomycin  (VANCOREADY) IVPB 2000 mg/400 mL        2,000 mg 200 mL/hr over 120 Minutes  Intravenous  Once 02/14/24 1444 02/14/24 1848        Assessment/Plan Left thigh abscess POD4: S/P I&D 12/11 Dr. Vernetta - Afebrile. - WBC 19.9 from 18.5; HGB 7.9 from 7.5. Continue to monitor - Continue twice daily moist-to-dry dressing changes or when soiled. Having diarrhea which will hopefully improve with initiation of solid diet and stopping TF. Also saw TRH started  imodium . - Continue IV abx and follow cultures; Cultures pending Proteus mirabilis so far. Possible anaerobe. Pansensitive  - re-ordered her home PO dilaudid  to help with pain control, will add IV PRN for breakthrough as well. Encephalopathy and BP are improving.  - no role for further debridement at present, we will check the wound again Wednesday or Thursday    FEN REg diet, cortrak w/ TF ID: [email or web address] , linezolid  VTE: SQH   Encephalopathy - EEG w/ generalized dysfunction, no seizures  ESRD on HD Anemia of ESRD Chronic hypotension DM2 HLD Afib - in sinus here Gout Depression/anxiety    LOS: 6 days   I reviewed specialist notes, consulting provider notes, nursing notes, hospitalist notes, last 24 h vitals and pain scores, last 48 h intake and output, last 24 h labs and trends, and last 24 h imaging results.   Deborah [name], [facility] 02/20/2024, 8:13 AM Please see Amion for pager number during day hours 7:00am-4:30pm

## 2024-02-21 LAB — RENAL FUNCTION PANEL
Albumin: 1.7 g/dL — ABNORMAL LOW (ref 3.5–5.0)
Anion gap: 15 (ref 5–15)
BUN: 16 mg/dL (ref 6–20)
CO2: 20 mmol/L — ABNORMAL LOW (ref 22–32)
Calcium: 8 mg/dL — ABNORMAL LOW (ref 8.9–10.3)
Chloride: 101 mmol/L (ref 98–111)
Creatinine, Ser: 3.88 mg/dL — ABNORMAL HIGH (ref 0.44–1.00)
GFR, Estimated: 13 mL/min — ABNORMAL LOW (ref >60)
Glucose, Bld: 200 mg/dL — ABNORMAL HIGH (ref 70–99)
Phosphorus: 2.9 mg/dL (ref 2.5–4.6)
Potassium: 4.3 mmol/L (ref 3.5–5.1)
Sodium: 136 mmol/L (ref 135–145)

## 2024-02-21 LAB — GLUCOSE, CAPILLARY
Glucose-Capillary: 182 mg/dL — ABNORMAL HIGH (ref 70–99)
Glucose-Capillary: 214 mg/dL — ABNORMAL HIGH (ref 70–99)
Glucose-Capillary: 224 mg/dL — ABNORMAL HIGH (ref 70–99)
Glucose-Capillary: 164 mg/dL — ABNORMAL HIGH (ref 70–99)
Glucose-Capillary: 192 mg/dL — ABNORMAL HIGH (ref 70–99)
Glucose-Capillary: 209 mg/dL — ABNORMAL HIGH (ref 70–99)

## 2024-02-21 LAB — CBC
HCT: 26.8 % — ABNORMAL LOW (ref 36.0–46.0)
Hemoglobin: 8.4 g/dL — ABNORMAL LOW (ref 12.0–15.0)
MCH: 28.1 pg (ref 26.0–34.0)
MCHC: 31.3 g/dL (ref 30.0–36.0)
MCV: 89.6 fL (ref 80.0–100.0)
Platelets: 243 K/uL (ref 150–400)
RBC: 2.99 MIL/uL — ABNORMAL LOW (ref 3.87–5.11)
RDW: 15.9 % — ABNORMAL HIGH (ref 11.5–15.5)
WBC: 20.5 K/uL — ABNORMAL HIGH (ref 4.0–10.5)
nRBC: 0 % (ref 0.0–0.2)

## 2024-02-21 LAB — CULTURE, BLOOD (ROUTINE X 2)
Culture: NO GROWTH
Culture: NO GROWTH

## 2024-02-21 MED ORDER — OXYCODONE HCL 5 MG PO TABS
5.0000 mg | ORAL_TABLET | ORAL | Status: DC | PRN
  Administered 2024-02-21: 18:00:00 10 mg via ORAL
  Filled 2024-02-21: qty 2

## 2024-02-21 MED ORDER — HYDROXYZINE HCL 25 MG PO TABS: 25.0000 mg | ORAL_TABLET | Freq: Four times a day (QID) | ORAL | Status: DC | PRN

## 2024-02-21 MED ORDER — HEPARIN SODIUM (PORCINE) 1000 UNIT/ML IJ SOLN
INTRAMUSCULAR | Status: AC
  Filled 2024-02-21: qty 4

## 2024-02-21 MED ORDER — HYDROMORPHONE HCL 1 MG/ML IJ SOLN
1.0000 mg | INTRAMUSCULAR | Status: DC | PRN
  Administered 2024-02-21 – 2024-02-22 (×3): 1 mg via INTRAVENOUS
  Filled 2024-02-21 (×3): qty 1

## 2024-02-21 MED ORDER — OSMOLITE 1.5 CAL PO LIQD
960.0000 mL | ORAL | Status: DC
  Administered 2024-02-22: 17:00:00 960 mL
  Filled 2024-02-21 (×4): qty 1000

## 2024-02-21 MED ORDER — RISPERIDONE 1 MG PO TBDP
1.0000 mg | ORAL_TABLET | Freq: Every day | ORAL | Status: DC
  Administered 2024-02-21 – 2024-02-24 (×3): 1 mg via ORAL
  Filled 2024-02-21 (×5): qty 1

## 2024-02-21 MED ORDER — ACETAMINOPHEN 325 MG PO TABS
650.0000 mg | ORAL_TABLET | Freq: Four times a day (QID) | ORAL | Status: DC | PRN
  Administered 2024-02-21: 18:00:00 650 mg via ORAL
  Filled 2024-02-21: qty 2

## 2024-02-21 MED ORDER — MIRTAZAPINE 15 MG PO TABS
15.0000 mg | ORAL_TABLET | Freq: Every day | ORAL | Status: DC
  Administered 2024-02-21: 21:00:00 15 mg via ORAL
  Filled 2024-02-21: qty 1

## 2024-02-21 MED ORDER — HEPARIN SODIUM (PORCINE) 1000 UNIT/ML IJ SOLN
3800.0000 [IU] | Freq: Once | INTRAMUSCULAR | Status: AC
  Administered 2024-02-21: 15:00:00 3800 [IU]

## 2024-02-21 NOTE — Progress Notes (Signed)
 PROGRESS NOTE        PATIENT DETAILS Name: Deborah [name] Age: 58 y.o. Sex: female Date of Birth: 1966/01/21 Admit Date: 02/14/2024 Admitting Physician Elgin DELENA Lam, MD PCP:Pcp, No  Brief Summary: Patient is a 58 y.o.  female with history of ESRD on HD, PAF, HLD, right BKA who presented to the hospital with altered mental status-she was found to have severe sepsis secondary to left thigh abscess-evaluated by general surgery underwent I&D on 12/11.  Significant events: 12/9>> admit to [facility]  Significant studies: 12/9>> CXR: No PNA 12/9>> CT head: No acute intracranial abnormality 12/11>> CT abdomen/pelvis: Fluid collection-left thigh 3.3 x 15.5 cm 12/11>> CT left hip: Abscess in the left anterior upper leg 12/12>> MRI brain: No acute intracranial abnormality 12/12>> Spot EEG: No seizures  Significant microbiology data: 12/9>> COVID/influenza/RSV PCR: Negative 12/9>> blood cultures: No growth 12/11>> left thigh abscess: Proteus/Enterococcus avium/Prevotella 12/11>> blood culture: No growth  Procedures: 12/11>> I&D left thigh abscess  Consults: General surgery PCCM Nephrology  Subjective: Lying comfortably in bed-denies any chest pain or shortness of breath.  Objective: Vitals: Blood pressure 105/70, pulse (!) 103, temperature 98.8 F (37.1 C), temperature source Oral, resp. rate 16, height 5' 6 (1.676 m), weight 97.3 kg, last menstrual period 10/10/2012, SpO2 100%.   Exam: Gen Exam:Alert awake-not in any distress HEENT:atraumatic, normocephalic Chest: B/L clear to auscultation anteriorly CVS:S1S2 regular Abdomen:soft non tender, non distended Extremities:no edema Neurology: Non focal Skin: no rash  Pertinent Labs/Radiology:    Latest Ref Rng & Units 02/21/2024    4:16 AM 02/20/2024    1:47 AM 02/19/2024    3:16 AM  CBC  WBC 4.0 - 10.5 K/uL 20.5  19.9  18.5   Hemoglobin 12.0 - 15.0 g/dL 8.4  7.9  7.5   Hematocrit 36.0 -  46.0 % 26.8  24.9  23.2   Platelets 150 - 400 K/uL 243  212  182     Lab Results  Component Value Date   NA 136 02/21/2024   K 4.3 02/21/2024   CL 101 02/21/2024   CO2 20 (L) 02/21/2024      Assessment/Plan: Severe sepsis secondary to left thigh abscess Sepsis physiology improved-s/p I&D 12/11-remains on broad-spectrum antibiotic  She has a central line started due to lack of access, once antibiotic fully changed to after dialysis it can be discontinued   Acute metabolic encephalopathy Secondary to sepsis physiology Neuroimaging/EEG as above Clinically improved-back to baseline  Oropharyngeal dysphagia Tube feeds being continued-to maximize nutrition/wound healing. Nutrition services following  ESRD on HD TTS Nephrology following  Normocytic anemia Related to ESRD Did receive 1 unit of PRBC 12/12 Follow Hb-and transfuse accordingly  Chronic hypotension Midodrine   DM-2 (A1c 4.3 on 12/10) CBG stable SSI   Recent Labs    02/21/24 1015 02/21/24 1624 02/21/24 2012  GLUCAP 224* 164* 192*     HLD Statin  PAF Telemetry monitoring Not on anticoagulation due to prior history of GI bleeding  Gout Allopurinol   Depression/anxiety Psych meds initially held due to encephalopathy-since clinically improved-risperidone /Remeron /as needed Atarax  has been resumed Monitor/follow  Subserosal fibroid extending along left adnexa Incidental finding on CT imaging Stable for outpatient follow-up  Left inguinal lymphadenopathy Likely reactive Stable for outpatient follow-up  Nutrition Status: Nutrition Problem: Inadequate oral intake Etiology: inability to eat Signs/Symptoms: NPO status Interventions: Tube feeding, Refer to RD  note for recommendations  Pressure Ulcer: Agree with assessment as outlined below Wound 02/19/24 0200 Pressure Injury Foot Left;Lateral Unstageable - Full thickness tissue loss in which the base of the injury is covered by slough (yellow,  tan, gray, green or brown) and/or eschar (tan, brown or black) in the wound bed. (Active)   Class 1 Obesity: Estimated body mass index is 34.62 kg/m as calculated from the following:   Height as of this encounter: 5' 6 (1.676 m).   Weight as of this encounter: 97.3 kg.   Code status:   Code Status: Full Code   DVT Prophylaxis: heparin  injection 5,000 Units Start: 02/14/24 2315   Family Communication: None at bedside   Disposition Plan: Status is: Inpatient Remains inpatient appropriate because: Severity of illness   Planned Discharge Destination:Skilled nursing facility   Diet: Diet Order             Diet Carb Modified Room service appropriate? Yes  Diet effective now                     Antimicrobial agents: Anti-infectives (From admission, onward)    Start     Dose/Rate Route Frequency Ordered Stop   02/21/24 1200  vancomycin  (VANCOCIN ) IVPB 1000 mg/200 mL premix       Placed in Followed by Linked Group   1,000 mg 200 mL/hr over 60 Minutes Intravenous Every T-Th-Sa (Hemodialysis) 02/20/24 1458     02/21/24 1200  ceFAZolin  (ANCEF ) IVPB 2g/100 mL premix       Placed in Followed by Linked Group   2 g 200 mL/hr over 30 Minutes Intravenous Every T-Th-Sa (Hemodialysis) 02/20/24 1458     02/20/24 2200  metroNIDAZOLE  (FLAGYL ) tablet 500 mg        500 mg Oral Every 12 hours 02/20/24 1458     02/20/24 1545  vancomycin  (VANCOREADY) IVPB 2000 mg/400 mL       Placed in Followed by Linked Group   2,000 mg 200 mL/hr over 120 Minutes Intravenous  Once 02/20/24 1458 02/20/24 1818   02/20/24 1545  ceFAZolin  (ANCEF ) IVPB 2g/100 mL premix       Placed in Followed by Linked Group   2 g 200 mL/hr over 30 Minutes Intravenous  Once 02/20/24 1458 02/20/24 1621   02/18/24 1800  meropenem  (MERREM ) 500 mg in sodium chloride  0.9 % 100 mL IVPB  Status:  Discontinued        500 mg 200 mL/hr over 30 Minutes Intravenous Every 24 hours 02/17/24 0941 02/20/24 1458   02/17/24  1030  meropenem  (MERREM ) 500 mg in sodium chloride  0.9 % 100 mL IVPB        500 mg 200 mL/hr over 30 Minutes Intravenous  Once 02/17/24 0941 02/17/24 1059   02/17/24 1000  meropenem  (MERREM ) 500 mg in sodium chloride  0.9 % 100 mL IVPB  Status:  Discontinued        500 mg 200 mL/hr over 30 Minutes Intravenous Every 24 hours 02/16/24 1045 02/17/24 0941   02/16/24 1145  linezolid  (ZYVOX ) IVPB 600 mg  Status:  Discontinued        600 mg 300 mL/hr over 60 Minutes Intravenous Every 12 hours 02/16/24 1045 02/20/24 1458   02/16/24 1145  meropenem  (MERREM ) 500 mg in sodium chloride  0.9 % 100 mL IVPB  Status:  Discontinued        500 mg 200 mL/hr over 30 Minutes Intravenous  Once 02/16/24 1045 02/17/24 0941   02/15/24 2000  ceFEPIme  (MAXIPIME ) 1 g in sodium chloride  0.9 % 100 mL IVPB  Status:  Discontinued        1 g 200 mL/hr over 30 Minutes Intravenous Every 24 hours 02/14/24 1649 02/16/24 1045   02/15/24 1700  vancomycin  (VANCOCIN ) IVPB 1000 mg/200 mL premix        1,000 mg 200 mL/hr over 60 Minutes Intravenous  Once 02/15/24 1528 02/15/24 2015   02/15/24 1415  vancomycin  variable dose per unstable renal function (pharmacist dosing)  Status:  Discontinued         Does not apply See admin instructions 02/15/24 1415 02/16/24 1045   02/15/24 0600  metroNIDAZOLE  (FLAGYL ) IVPB 500 mg  Status:  Discontinued        500 mg 100 mL/hr over 60 Minutes Intravenous Every 12 hours 02/14/24 2305 02/16/24 1046   02/14/24 1445  aztreonam  (AZACTAM ) 2 g in sodium chloride  0.9 % 100 mL IVPB  Status:  Discontinued        2 g 200 mL/hr over 30 Minutes Intravenous  Once 02/14/24 1435 02/14/24 1440   02/14/24 1445  metroNIDAZOLE  (FLAGYL ) IVPB 500 mg        500 mg 100 mL/hr over 60 Minutes Intravenous  Once 02/14/24 1435 02/14/24 1844   02/14/24 1445  vancomycin  (VANCOCIN ) IVPB 1000 mg/200 mL premix  Status:  Discontinued        1,000 mg 200 mL/hr over 60 Minutes Intravenous  Once 02/14/24 1435 02/14/24 1444    02/14/24 1445  ceFEPIme  (MAXIPIME ) 2 g in sodium chloride  0.9 % 100 mL IVPB        2 g 200 mL/hr over 30 Minutes Intravenous  Once 02/14/24 1444 02/14/24 1606   02/14/24 1445  vancomycin  (VANCOREADY) IVPB 2000 mg/400 mL        2,000 mg 200 mL/hr over 120 Minutes Intravenous  Once 02/14/24 1444 02/14/24 1848        MEDICATIONS: Scheduled Meds:  allopurinol   100 mg Per Tube BID   atorvastatin   10 mg Per Tube QPM   cholestyramine  light  4 g Per Tube BID   famotidine   20 mg Per Tube QAC breakfast   [START ON 02/22/2024] feeding supplement (OSMOLITE 1.5 CAL)  960 mL Per Tube Q24H   ferric citrate   420 mg Oral TID WC   folic acid   1 mg Per Tube Daily   Gerhardt's butt cream   Topical TID   heparin   5,000 Units Subcutaneous Q8H   hydrocerin  1 Application Topical BID   hyoscyamine   0.125 mg Sublingual TID   insulin  aspart  0-6 Units Subcutaneous Q4H   metroNIDAZOLE   500 mg Oral Q12H   midodrine   10 mg Per Tube TID   mirtazapine   15 mg Oral QHS   pantoprazole   40 mg Oral Daily   risperiDONE   1 mg Oral QHS   thiamine   200 mg Per Tube Daily   Continuous Infusions:   ceFAZolin  (ANCEF ) IV 2 g (02/21/24 1609)   feeding supplement (OSMOLITE 1.5 CAL) 1,000 mL (02/21/24 1035)   vancomycin  1,000 mg (02/21/24 1608)   PRN Meds:.acetaminophen , albuterol , HYDROcodone -acetaminophen , HYDROmorphone  (DILAUDID ) injection, HYDROmorphone  (DILAUDID ) injection, HYDROmorphone , hydrOXYzine , loperamide , ondansetron , oxyCODONE , polyethylene glycol, sodium chloride  flush   I have personally reviewed following labs and imaging studies  LABORATORY DATA: CBC: Recent Labs  Lab 02/15/24 0836 02/16/24 0412 02/17/24 0257 02/17/24 1415 02/18/24 0321 02/19/24 0316 02/20/24 0147 02/21/24 0416  WBC 35.0*   < > 31.2*  --  24.6* 18.5*  19.9* 20.5*  NEUTROABS 29.8*  --   --   --   --   --   --   --   HGB 8.1*   < > 7.6* 8.2* 7.8* 7.5* 7.9* 8.4*  HCT 25.3*   < > 23.5* 24.0* 23.7* 23.2* 24.9* 26.8*  MCV 92.7    < > 89.4  --  86.2 86.2 87.4 89.6  PLT 183   < > 178  --  173 182 212 243   < > = values in this interval not displayed.    Basic Metabolic Panel: Recent Labs  Lab 02/17/24 0257 02/17/24 1415 02/18/24 1839 02/19/24 0316 02/19/24 1813 02/20/24 0147 02/21/24 0416  NA 135   < > 133* 133* 136 134* 136  K 3.8   < > 3.6 3.4* 3.7 3.7 4.3  CL 99   < > 98 96* 102 99 101  CO2 27   < > 29 28 26 26  20*  GLUCOSE 103*   < > 104* 182* 195* 201* 200*  BUN 18   < > <5* <5* 8 9 16   CREATININE 4.76*   < > 1.43* 1.88* 2.55* 2.83* 3.88*  CALCIUM  7.9*   < > 7.2* 7.1* 7.4* 7.1* 8.0*  MG 1.6*  --   --   --  1.9  --   --   PHOS 2.3*   < > 1.1* 3.1 2.5 2.8 2.9   < > = values in this interval not displayed.    GFR: Estimated Creatinine Clearance: 18.4 mL/min (A) (by C-G formula based on SCr of 3.88 mg/dL (H)).  Liver Function Tests: Recent Labs  Lab 02/15/24 0836 02/17/24 0257 02/18/24 1839 02/19/24 0316 02/19/24 1813 02/20/24 0147 02/21/24 0416  AST 10*  --   --   --   --   --   --   ALT 8  --   --   --   --   --   --   ALKPHOS 87  --   --   --   --   --   --   BILITOT 0.9  --   --   --   --   --   --   PROT 4.8*  --   --   --   --   --   --   ALBUMIN  <1.5*   < > 2.0* 1.8* 1.7* 1.6* 1.7*   < > = values in this interval not displayed.   No results for input(s): LIPASE, AMYLASE in the last 168 hours. Recent Labs  Lab 02/15/24 0818 02/17/24 1300  AMMONIA 29 18    Coagulation Profile: No results for input(s): INR, PROTIME in the last 168 hours.  Cardiac Enzymes: No results for input(s): CKTOTAL, CKMB, CKMBINDEX, TROPONINI in the last 168 hours.  BNP (last 3 results) No results for input(s): PROBNP in the last 8760 hours.  Lipid Profile: No results for input(s): CHOL, HDL, LDLCALC, TRIG, CHOLHDL, LDLDIRECT in the last 72 hours.  Thyroid  Function Tests: No results for input(s): TSH, T4TOTAL, FREET4, T3FREE, THYROIDAB in the last 72  hours.  Anemia Panel: No results for input(s): VITAMINB12, FOLATE, FERRITIN, TIBC, IRON , RETICCTPCT in the last 72 hours.  Urine analysis:    Component Value Date/Time   COLORURINE STRAW (A) 06/19/2022 0638   APPEARANCEUR HAZY (A) 06/19/2022 0638   LABSPEC 1.010 06/19/2022 0638   PHURINE 5.0 06/19/2022 0638   GLUCOSEU NEGATIVE 06/19/2022 0638   HGBUR SMALL (A) 06/19/2022  9361   BILIRUBINUR NEGATIVE 06/19/2022 0638   KETONESUR NEGATIVE 06/19/2022 0638   PROTEINUR 30 (A) 06/19/2022 0638   UROBILINOGEN 0.2 10/02/2013 2108   NITRITE NEGATIVE 06/19/2022 0638   LEUKOCYTESUR LARGE (A) 06/19/2022 0638    Sepsis Labs: Lactic Acid, Venous    Component Value Date/Time   LATICACIDVEN 0.7 02/17/2024 1218    MICROBIOLOGY: Recent Results (from the past 240 hours)  Culture, blood (single)     Status: None   Collection Time: 02/14/24  1:10 PM   Specimen: BLOOD RIGHT FOREARM  Result Value Ref Range Status   Specimen Description BLOOD RIGHT FOREARM  Final   Special Requests   Final    BOTTLES DRAWN AEROBIC AND ANAEROBIC Blood Culture results may not be optimal due to an inadequate volume of blood received in culture bottles   Culture   Final    NO GROWTH 5 DAYS Performed at [facility], [address], [place], [place] [address]    Report Status 02/19/2024 FINAL  Final  Resp panel by RT-PCR (RSV, Flu A&B, Covid) Anterior Nasal Swab     Status: None   Collection Time: 02/14/24  3:23 PM   Specimen: Anterior Nasal Swab  Result Value Ref Range Status   SARS Coronavirus 2 by RT PCR NEGATIVE NEGATIVE Final   Influenza A by PCR NEGATIVE NEGATIVE Final   Influenza B by PCR NEGATIVE NEGATIVE Final    Comment: (NOTE) The Xpert Xpress SARS-CoV-2/FLU/RSV plus assay is intended as an aid in the diagnosis of influenza from Nasopharyngeal swab specimens and should not be used as a sole basis for treatment. Nasal washings and aspirates are unacceptable for Xpert Xpress  SARS-CoV-2/FLU/RSV testing.  Fact Sheet for Patients: bloggercourse.com  Fact Sheet for Healthcare Providers: seriousbroker.it  This test is not yet approved or cleared by the [place]  FDA and has been authorized for detection and/or diagnosis of SARS-CoV-2 by FDA under an Emergency Use Authorization (EUA). This EUA will remain in effect (meaning this test can be used) for the duration of the COVID-19 declaration under Section 564(b)(1) of the Act, 21 U.S.C. section 360bbb-3(b)(1), unless the authorization is terminated or revoked.     Resp Syncytial Virus by PCR NEGATIVE NEGATIVE Final    Comment: (NOTE) Fact Sheet for Patients: bloggercourse.com  Fact Sheet for Healthcare Providers: seriousbroker.it  This test is not yet approved or cleared by the [place]  FDA and has been authorized for detection and/or diagnosis of SARS-CoV-2 by FDA under an Emergency Use Authorization (EUA). This EUA will remain in effect (meaning this test can be used) for the duration of the COVID-19 declaration under Section 564(b)(1) of the Act, 21 U.S.C. section 360bbb-3(b)(1), unless the authorization is terminated or revoked.  Performed at [facility], [address] [place], [place] [address]   Culture, blood (Routine X 2) w Reflex to ID Panel     Status: None   Collection Time: 02/16/24  4:12 AM   Specimen: BLOOD RIGHT HAND  Result Value Ref Range Status   Specimen Description BLOOD RIGHT HAND  Final   Special Requests   Final    BOTTLES DRAWN AEROBIC ONLY Blood Culture results may not be optimal due to an inadequate volume of blood received in culture bottles   Culture   Final    NO GROWTH 5 DAYS Performed at [facility], [address], [place], [place] [address]    Report Status 02/21/2024 FINAL  Final  Culture,  blood (Routine X 2) w Reflex to ID Panel     Status:  None   Collection Time: 02/16/24  4:14 AM   Specimen: BLOOD RIGHT HAND  Result Value Ref Range Status   Specimen Description BLOOD RIGHT HAND  Final   Special Requests   Final    BOTTLES DRAWN AEROBIC ONLY Blood Culture results may not be optimal due to an inadequate volume of blood received in culture bottles   Culture   Final    NO GROWTH 5 DAYS Performed at [facility], [address], [place], [place] [address]    Report Status 02/21/2024 FINAL  Final  Aerobic/Anaerobic Culture w Gram Stain (surgical/deep wound)     Status: None   Collection Time: 02/16/24  2:30 PM   Specimen: Path fluid; Body Fluid  Result Value Ref Range Status   Specimen Description FLUID  Final   Special Requests LEFT THIGH ABSCESS  Final   Gram Stain   Final    ABUNDANT WBC PRESENT, PREDOMINANTLY PMN RARE GRAM NEGATIVE RODS FEW GRAM POSITIVE RODS RARE GRAM POSITIVE COCCI    Culture   Final    MODERATE PROTEUS MIRABILIS FEW ENTEROCOCCUS AVIUM FEW PREVOTELLA BUCCAE BETA LACTAMASE POSITIVE Performed at [facility], [address] [place], [place] [address]    Report Status 02/20/2024 FINAL  Final   Organism ID, Bacteria PROTEUS MIRABILIS  Final   Organism ID, Bacteria ENTEROCOCCUS AVIUM  Final      Susceptibility   Enterococcus avium - MIC*    AMPICILLIN <=2 SENSITIVE Sensitive     VANCOMYCIN  <=0.5 SENSITIVE Sensitive     GENTAMICIN SYNERGY SENSITIVE Sensitive     * FEW ENTEROCOCCUS AVIUM   Proteus mirabilis - MIC*    AMPICILLIN <=2 SENSITIVE Sensitive     CEFAZOLIN  (NON-URINE) 2 SENSITIVE Sensitive     CEFEPIME  <=0.12 SENSITIVE Sensitive     ERTAPENEM <=0.12 SENSITIVE Sensitive     CEFTRIAXONE  <=0.25 SENSITIVE Sensitive     CIPROFLOXACIN  <=0.06 SENSITIVE Sensitive     GENTAMICIN <=1 SENSITIVE Sensitive     MEROPENEM  0.5 SENSITIVE Sensitive     TRIMETH/SULFA <=20 SENSITIVE Sensitive     AMPICILLIN/SULBACTAM <=2 SENSITIVE Sensitive     PIP/TAZO Value in next row Sensitive       <=4 SENSITIVEThis is a modified FDA-approved test that has been validated and its performance characteristics determined by the reporting laboratory.  This laboratory is certified under the Clinical Laboratory Improvement Amendments CLIA as qualified to perform high complexity clinical laboratory testing.    * MODERATE PROTEUS MIRABILIS    RADIOLOGY STUDIES/RESULTS: No results found.   LOS: 7 days   Donalda Applebaum, MD  [facility]    To contact the attending provider between 7A-7P or the covering provider during after hours 7P-7A, please log into the web site [email or web address] and access using universal [place] password for that web site. If you do not have the password, please call the hospital operator.  02/21/2024, 9:04 PM

## 2024-02-21 NOTE — Progress Notes (Signed)
 PROGRESS NOTE   Deborah [name]  FMW:[id number]    DOB: 24-Mar-1965    DOA: 02/14/2024  PCP: Pcp, No   I have briefly reviewed patients previous medical records in [facility] : [facility].   Brief Hospital Course:   58 year old female with medical history significant for ESRD on HD, paroxysmal A-fib, DM2, chronic pain, HLD, R BKA, presented to the ED via EMS from her dialysis center with altered mental status, lethargy, alert and oriented x 1, slightly hypotensive and tachycardic, reportedly did not get dialysis, had left-sided deficits from prior stroke.  Admitted for acute metabolic encephalopathy of unclear etiology, remained altered 12/10.  Workup significant for severe sepsis, she was found to have significant left thigh abscess, went to the OR 12/11.   Assessment & Plan:   Acute metabolic encephalopathy - Due to sepsis -CT head with no acute findings -MRI brain with no acute finding - avoid cefepime , now on meropenem  - Ammonia within normal limit - EEG with no seizure activities - Continue with thiamine  - Tube feed - Initial VBG significant for CO2 retention and acidosis, but appears to be an error as repeat ABG no CO2 retention, PCCM input greatly appreciated - MRI brain with no acute findings. - Resolved, mentation back to baseline.  Severe sepsis, left thigh abscess - due to  thigh, groin abscess -The abdomen/pelvis and left thigh area noted. - Surgery consulted, status post I&D 12/11 by Dr. Vernetta  -Postop wound care per general surgery -Spectrum antibiotic coverage, now intraoperative culture data available, showing polymicrobial, Pan-sens proteus mirabilis, enterococcus avium, prevotella buccae, GPR  -Antibiotics to cefazolin , vancomycin  and Flagyl  -continue with wound care  - Will be kept on tube feed during hospitalization due to severe hypoalbuminemia and poor nutrition status to promote wound healing  ESRD  - Renal consulted, HD per renal  Diabetes mellitus  type 2 Well-controlled based on prior hemoglobin A1c from 2024 of 5.7%. - Continue with insulin  sliding scale   Chronic hypotension Complicated by hemodialysis. Patient is on midodrine  before hemodialysis as an outpatient.   - Continue with midodrine  - Deceived IV albumin    Hyperlipidemia Noted.  Resume Lipitor   History of paroxysmal atrial fibrillation Noted.  Patient does not have anticoagulation or rate control medication list on home medication list.  Patient is currently in sinus rhythm.   Gout Allopurinol    Depression Anxiety Patient is listed as taking BuSpar , risperidone , trazodone , Remeron .  Medication initially held due to encephalopathy, now we will resume that mentation back to baseline.   History of nausea vomiting Patient without current symptoms.  Obesity class I Severe hypophosphatemia Hypokalemia PCM Hypoalbuminemia - Body mass index is 35.16 kg/m. - Appears to be severely malnourished, started on  cortrak tube feed - High risk for refeeding syndrome, phosphorus and potassium , lites monitored and replaced regularly  - Has improved this morning, she is started on regular diet, nutrition consulted, will keep on tube feed during her hospitalization, but will transition to nocturnal tube feed tomorrow  Anemia of ESRD Received 1 unit PRBC 12/12 specially with hypotension  Body mass index is 35.16 kg/m.    She has a central line started due to lack of access, once antibiotic fully changed to after dialysis it can be discontinued   DVT prophylaxis: heparin  injection 5,000 Units Start: 02/14/24 2315     Code Status: Full Code:  Family Communication: None at bedside today Disposition:  Status is: Inpatient Remains inpatient appropriate because: Ongoing AMS.  Consultants:   Nephrology General Surgery PCCM  Procedures:   Hemodialysis Left thigh abscess I&D by Dr. Vernetta 02/16/2024 Left subclavian TLC placement by PCCM  02/17/2024   Subjective:   Mentation back to baseline, she still reports not feeling hungry reports she is feeling much better .  Objective:   Vitals:   02/21/24 1300 02/21/24 1330 02/21/24 1400 02/21/24 1430  BP: 116/74 130/85 115/73 107/67  Pulse: (!) 109 (!) 108 (!) 115 (!) 107  Resp: 14 16 18 20   Temp:      TempSrc:      SpO2: 100% 100% 100% 100%  Weight:      Height:        Awake, alert, oriented x 3, appropriate and pleasant, chronically ill-appearing, frail Clear to auscultation Regular rate and rhythm Abdomen soft  Left groin bandaged  Right BKA     Data Reviewed:   I have personally reviewed following labs and imaging studies   CBC: Recent Labs  Lab 02/15/24 0836 02/16/24 0412 02/19/24 0316 02/20/24 0147 02/21/24 0416  WBC 35.0*   < > 18.5* 19.9* 20.5*  NEUTROABS 29.8*  --   --   --   --   HGB 8.1*   < > 7.5* 7.9* 8.4*  HCT 25.3*   < > 23.2* 24.9* 26.8*  MCV 92.7   < > 86.2 87.4 89.6  PLT 183   < > 182 212 243   < > = values in this interval not displayed.    Basic Metabolic Panel: Recent Labs  Lab 02/17/24 0257 02/17/24 1415 02/18/24 1839 02/19/24 0316 02/19/24 1813 02/20/24 0147 02/21/24 0416  NA 135   < > 133* 133* 136 134* 136  K 3.8   < > 3.6 3.4* 3.7 3.7 4.3  CL 99   < > 98 96* 102 99 101  CO2 27   < > 29 28 26 26  20*  GLUCOSE 103*   < > 104* 182* 195* 201* 200*  BUN 18   < > <5* <5* 8 9 16   CREATININE 4.76*   < > 1.43* 1.88* 2.55* 2.83* 3.88*  CALCIUM  7.9*   < > 7.2* 7.1* 7.4* 7.1* 8.0*  MG 1.6*  --   --   --  1.9  --   --   PHOS 2.3*   < > 1.1* 3.1 2.5 2.8 2.9   < > = values in this interval not displayed.    Liver Function Tests: Recent Labs  Lab 02/15/24 0836 02/17/24 0257 02/18/24 1839 02/19/24 0316 02/19/24 1813 02/20/24 0147 02/21/24 0416  AST 10*  --   --   --   --   --   --   ALT 8  --   --   --   --   --   --   ALKPHOS 87  --   --   --   --   --   --   BILITOT 0.9  --   --   --   --   --   --   PROT 4.8*   --   --   --   --   --   --   ALBUMIN  <1.5*   < > 2.0* 1.8* 1.7* 1.6* 1.7*   < > = values in this interval not displayed.    CBG: Recent Labs  Lab 02/21/24 0308 02/21/24 0811 02/21/24 1015  GLUCAP 182* 214* 224*    Microbiology Studies:   Recent Results (  from the past 240 hours)  Culture, blood (single)     Status: None   Collection Time: 02/14/24  1:10 PM   Specimen: BLOOD RIGHT FOREARM  Result Value Ref Range Status   Specimen Description BLOOD RIGHT FOREARM  Final   Special Requests   Final    BOTTLES DRAWN AEROBIC AND ANAEROBIC Blood Culture results may not be optimal due to an inadequate volume of blood received in culture bottles   Culture   Final    NO GROWTH 5 DAYS Performed at [facility], [address] [place], [place] [address]    Report Status 02/19/2024 FINAL  Final  Resp panel by RT-PCR (RSV, Flu A&B, Covid) Anterior Nasal Swab     Status: None   Collection Time: 02/14/24  3:23 PM   Specimen: Anterior Nasal Swab  Result Value Ref Range Status   SARS Coronavirus 2 by RT PCR NEGATIVE NEGATIVE Final   Influenza A by PCR NEGATIVE NEGATIVE Final   Influenza B by PCR NEGATIVE NEGATIVE Final    Comment: (NOTE) The Xpert Xpress SARS-CoV-2/FLU/RSV plus assay is intended as an aid in the diagnosis of influenza from Nasopharyngeal swab specimens and should not be used as a sole basis for treatment. Nasal washings and aspirates are unacceptable for Xpert Xpress SARS-CoV-2/FLU/RSV testing.  Fact Sheet for Patients: bloggercourse.com  Fact Sheet for Healthcare Providers: seriousbroker.it  This test is not yet approved or cleared by the [place]  FDA and has been authorized for detection and/or diagnosis of SARS-CoV-2 by FDA under an Emergency Use Authorization (EUA). This EUA will remain in effect (meaning this test can be used) for the duration of the COVID-19 declaration under Section 564(b)(1) of the  Act, 21 U.S.C. section 360bbb-3(b)(1), unless the authorization is terminated or revoked.     Resp Syncytial Virus by PCR NEGATIVE NEGATIVE Final    Comment: (NOTE) Fact Sheet for Patients: bloggercourse.com  Fact Sheet for Healthcare Providers: seriousbroker.it  This test is not yet approved or cleared by the [place]  FDA and has been authorized for detection and/or diagnosis of SARS-CoV-2 by FDA under an Emergency Use Authorization (EUA). This EUA will remain in effect (meaning this test can be used) for the duration of the COVID-19 declaration under Section 564(b)(1) of the Act, 21 U.S.C. section 360bbb-3(b)(1), unless the authorization is terminated or revoked.  Performed at [facility], [address] [place], [place] [address]   Culture, blood (Routine X 2) w Reflex to ID Panel     Status: None   Collection Time: 02/16/24  4:12 AM   Specimen: BLOOD RIGHT HAND  Result Value Ref Range Status   Specimen Description BLOOD RIGHT HAND  Final   Special Requests   Final    BOTTLES DRAWN AEROBIC ONLY Blood Culture results may not be optimal due to an inadequate volume of blood received in culture bottles   Culture   Final    NO GROWTH 5 DAYS Performed at [facility], [address], [place], [place] [address]    Report Status 02/21/2024 FINAL  Final  Culture, blood (Routine X 2) w Reflex to ID Panel     Status: None   Collection Time: 02/16/24  4:14 AM   Specimen: BLOOD RIGHT HAND  Result Value Ref Range Status   Specimen Description BLOOD RIGHT HAND  Final   Special Requests   Final    BOTTLES DRAWN AEROBIC ONLY Blood Culture results may not be optimal  due to an inadequate volume of blood received in culture bottles   Culture   Final    NO GROWTH 5 DAYS Performed at [facility], [address], [place], [place] [address]    Report Status 02/21/2024 FINAL  Final  Aerobic/Anaerobic Culture w Gram  Stain (surgical/deep wound)     Status: None   Collection Time: 02/16/24  2:30 PM   Specimen: Path fluid; Body Fluid  Result Value Ref Range Status   Specimen Description FLUID  Final   Special Requests LEFT THIGH ABSCESS  Final   Gram Stain   Final    ABUNDANT WBC PRESENT, PREDOMINANTLY PMN RARE GRAM NEGATIVE RODS FEW GRAM POSITIVE RODS RARE GRAM POSITIVE COCCI    Culture   Final    MODERATE PROTEUS MIRABILIS FEW ENTEROCOCCUS AVIUM FEW PREVOTELLA BUCCAE BETA LACTAMASE POSITIVE Performed at [facility], [address] [place], [place] [address]    Report Status 02/20/2024 FINAL  Final   Organism ID, Bacteria PROTEUS MIRABILIS  Final   Organism ID, Bacteria ENTEROCOCCUS AVIUM  Final      Susceptibility   Enterococcus avium - MIC*    AMPICILLIN <=2 SENSITIVE Sensitive     VANCOMYCIN  <=0.5 SENSITIVE Sensitive     GENTAMICIN SYNERGY SENSITIVE Sensitive     * FEW ENTEROCOCCUS AVIUM   Proteus mirabilis - MIC*    AMPICILLIN <=2 SENSITIVE Sensitive     CEFAZOLIN  (NON-URINE) 2 SENSITIVE Sensitive     CEFEPIME  <=0.12 SENSITIVE Sensitive     ERTAPENEM <=0.12 SENSITIVE Sensitive     CEFTRIAXONE  <=0.25 SENSITIVE Sensitive     CIPROFLOXACIN  <=0.06 SENSITIVE Sensitive     GENTAMICIN <=1 SENSITIVE Sensitive     MEROPENEM  0.5 SENSITIVE Sensitive     TRIMETH/SULFA <=20 SENSITIVE Sensitive     AMPICILLIN/SULBACTAM <=2 SENSITIVE Sensitive     PIP/TAZO Value in next row Sensitive      <=4 SENSITIVEThis is a modified FDA-approved test that has been validated and its performance characteristics determined by the reporting laboratory.  This laboratory is certified under the Clinical Laboratory Improvement Amendments CLIA as qualified to perform high complexity clinical laboratory testing.    * MODERATE PROTEUS MIRABILIS    Radiology Studies:  No results found.   Scheduled Meds:    allopurinol   100 mg Per Tube BID   atorvastatin   10 mg Per Tube QPM   cholestyramine  light  4 g Per  Tube BID   famotidine   20 mg Per Tube QAC breakfast   [START ON 02/22/2024] feeding supplement (OSMOLITE 1.5 CAL)  960 mL Per Tube Q24H   ferric citrate   420 mg Oral TID WC   folic acid   1 mg Per Tube Daily   Gerhardt's butt cream   Topical TID   heparin   5,000 Units Subcutaneous Q8H   hydrocerin  1 Application Topical BID   hyoscyamine   0.125 mg Sublingual TID   insulin  aspart  0-6 Units Subcutaneous Q4H   metroNIDAZOLE   500 mg Oral Q12H   midodrine   10 mg Per Tube TID   pantoprazole   40 mg Oral Daily   thiamine   200 mg Per Tube Daily    Continuous Infusions:     ceFAZolin  (ANCEF ) IV     feeding supplement (OSMOLITE 1.5 CAL) 1,000 mL (02/21/24 1035)   vancomycin        LOS: 7 days     Brayton Lye, MD,     To contact the attending provider between 7A-7P or  the covering provider during after hours 7P-7A, please log into the web site [email or web address] and access using universal  password for that web site. If you do not have the password, please call the hospital operator.  02/21/2024, 2:51 PM

## 2024-02-21 NOTE — Progress Notes (Signed)
 [facility] Progress Note   Subjective:    Seen and examined patient at bedside. Eyes open and responding. Denies any acute issues. Plan for HD today.  Objective Vitals:   02/21/24 0306 02/21/24 0500 02/21/24 0800 02/21/24 1116  BP:   134/89 131/83  Pulse:   98 98  Resp:   (!) 26 12  Temp: 97.8 F (36.6 C)  98.9 F (37.2 C) 97.8 F (36.6 C)  TempSrc: Oral  Axillary   SpO2:   100% 100%  Weight:  98.5 kg  (S) 98.8 kg  Height:       Physical Exam General: Chronically ill appearing female in NAD Heart: RRR, no murmurs Lungs: CTA bilaterally, respirations unlabored on RA Abdomen: Soft, non-distended, +BS Extremities: no edema b/l lower extremities Dialysis Access: [facility]  Filed Weights   02/18/24 1703 02/21/24 0500 02/21/24 1116  Weight: 95.5 kg 98.5 kg (S) 98.8 kg   No intake or output data in the 24 hours ending 02/21/24 1123  Additional Objective Labs: Basic Metabolic Panel: Recent Labs  Lab 02/19/24 1813 02/20/24 0147 02/21/24 0416  NA 136 134* 136  K 3.7 3.7 4.3  CL 102 99 101  CO2 26 26 20*  GLUCOSE 195* 201* 200*  BUN 8 9 16   CREATININE 2.55* 2.83* 3.88*  CALCIUM  7.4* 7.1* 8.0*  PHOS 2.5 2.8 2.9   Liver Function Tests: Recent Labs  Lab 02/14/24 1402 02/15/24 0836 02/17/24 0257 02/19/24 1813 02/20/24 0147 02/21/24 0416  AST 13* 10*  --   --   --   --   ALT 10 8  --   --   --   --   ALKPHOS 109 87  --   --   --   --   BILITOT 0.9 0.9  --   --   --   --   PROT 6.3* 4.8*  --   --   --   --   ALBUMIN  1.7* <1.5*   < > 1.7* 1.6* 1.7*   < > = values in this interval not displayed.   No results for input(s): LIPASE, AMYLASE in the last 168 hours. CBC: Recent Labs  Lab 02/15/24 0836 02/16/24 0412 02/17/24 0257 02/17/24 1415 02/18/24 0321 02/19/24 0316 02/20/24 0147 02/21/24 0416  WBC 35.0*   < > 31.2*  --  24.6* 18.5* 19.9* 20.5*  NEUTROABS 29.8*  --   --   --   --   --   --   --   HGB 8.1*   < > 7.6*   < > 7.8* 7.5* 7.9*  8.4*  HCT 25.3*   < > 23.5*   < > 23.7* 23.2* 24.9* 26.8*  MCV 92.7   < > 89.4  --  86.2 86.2 87.4 89.6  PLT 183   < > 178  --  173 182 212 243   < > = values in this interval not displayed.   Blood Culture    Component Value Date/Time   SDES FLUID 02/16/2024 1430   SPECREQUEST LEFT THIGH ABSCESS 02/16/2024 1430   CULT  02/16/2024 1430    MODERATE PROTEUS MIRABILIS FEW ENTEROCOCCUS AVIUM FEW PREVOTELLA BUCCAE BETA LACTAMASE POSITIVE Performed at [facility], [address] [place], [place] [address]    REPTSTATUS 02/20/2024 FINAL 02/16/2024 1430    Cardiac Enzymes: No results for input(s): CKTOTAL, CKMB, CKMBINDEX, TROPONINI in the last 168 hours. CBG: Recent Labs  Lab 02/20/24 1959 02/20/24 2320 02/21/24 0308 02/21/24  9188 02/21/24 1015  GLUCAP 208* 174* 182* 214* 224*   Iron  Studies: No results for input(s): IRON , TIBC, TRANSFERRIN, FERRITIN in the last 72 hours. Lab Results  Component Value Date   INR 1.5 (H) 03/22/2023   INR 1.9 (H) 12/15/2022   INR 1.8 (H) 11/23/2022   Studies/Results: No results found.  Medications:   ceFAZolin  (ANCEF ) IV     feeding supplement (OSMOLITE 1.5 CAL) 1,000 mL (02/21/24 1035)   vancomycin       allopurinol   100 mg Per Tube BID   atorvastatin   10 mg Per Tube QPM   cholestyramine  light  4 g Per Tube BID   famotidine   20 mg Per Tube QAC breakfast   ferric citrate   420 mg Oral TID WC   folic acid   1 mg Per Tube Daily   Gerhardt's butt cream   Topical TID   heparin   5,000 Units Subcutaneous Q8H   hydrocerin  1 Application Topical BID   hyoscyamine   0.125 mg Sublingual TID   insulin  aspart  0-6 Units Subcutaneous Q4H   metroNIDAZOLE   500 mg Oral Q12H   midodrine   10 mg Per Tube TID   pantoprazole   40 mg Oral Daily   thiamine   200 mg Per Tube Daily    Dialysis Orders: [facility] 4:00 400BFR EDW 96.1 kg 3K/2.5Ca TDC No bolus heparin    Assessment/Plan: AMS/ possible sepsis: WBC remain elevated, on  antibiotics per primary team. Changed from cefepime  to meropenem .  L thigh abscess. Surgery consulted, s/p I&D on 02/16/24 ESRD: on HD TTS. HD Tuesday per regular schedule. Cr and BUN are now low but no UOP? Unclear why BUN would be so low. Will continue to follow.  Hypotension.  Has chronic low BP's takes midodrine  pre HD three times per week. Continue here.  Volume: Bps improving, does not appear overloaded. UFG set 1-1.5L as tolerated. Anemia. Hgb 7.5,  Mircera 225  given 12/6 at OP clinic, next dose due 12/20.  Hypokalemia/hypophos: After tube feeds initiated, receiving supplementation.   [name], NP [facility] 02/21/2024,11:23 AM  LOS: 7 days

## 2024-02-21 NOTE — Plan of Care (Signed)
°  Problem: Coping: Goal: Ability to adjust to condition or change in health will improve Outcome: Progressing   Problem: Fluid Volume: Goal: Ability to maintain a balanced intake and output will improve Outcome: Progressing   Problem: Health Behavior/Discharge Planning: Goal: Ability to manage health-related needs will improve Outcome: Progressing   Problem: Nutritional: Goal: Maintenance of adequate nutrition will improve Outcome: Progressing Goal: Progress toward achieving an optimal weight will improve Outcome: Progressing   Problem: Tissue Perfusion: Goal: Adequacy of tissue perfusion will improve Outcome: Progressing   Problem: Clinical Measurements: Goal: Diagnostic test results will improve Outcome: Progressing   Problem: Respiratory: Goal: Ability to maintain adequate ventilation will improve Outcome: Progressing

## 2024-02-21 NOTE — Progress Notes (Addendum)
 Nutrition Brief Note  Discussed patient during progression rounds today. She is approaching discharge. Likely by the end of the week. Plan for HD today.  Has required PHOS/K+/Mg repletion this admission likely due to refeeding. Suggestive of poor PO intake PTA.   Estimated Nutritional Needs:  Kcal:  1800-2000 kcal Protein:  85-100g Fluid:  1L + UOP  Left thigh abscess s/p I&D on 12/11. Have discussed maintaining consistent and adequate nutrition for as long as possible to promote wound healing with MD. Amicable to running TF continuously today and transitioning to nocturnal regimen tomorrow to allow for maximum PO intake. Will likely remove Cortrak as discharge approaches. MD spoke with patient about this. She is amicable to this plan.    INTERVENTION:  Transition tube feeds via Cortrak to nocturnal regimen on 12/17: Osmolite 1.5 at 80 mL/hr ( per day) from 6PM-6AM Provides 1440 calories (80% estimated needs), 60 gm protein (71% estimated needs), and 732 mL free water daily. Encourage good PO intake Continue Thiamine , Folic acid , Multivitamin w/ minerals daily Monitor PO intake and ability to wean nutrition support   NUTRITION DIAGNOSIS:  Inadequate oral intake related to inability to eat as evidenced by NPO status. - Progressing, now on diet    GOAL: Patient will meet greater than or equal to 90% of their needs - Being met via TF  Deborah Deaner MS, RD, LDN Registered Dietitian Clinical Nutrition RD Inpatient Contact Info in Amion

## 2024-02-21 NOTE — Progress Notes (Signed)
 Patient ate half of a hamburger and some potatoes for dinner without issue.

## 2024-02-21 NOTE — Progress Notes (Signed)
 Received patient in bed to unit.  Alert and oriented.  Informed consent signed and in chart.   TX duration:3.5 hours  Patient tolerated well.  Transported back to the room  Alert, without acute distress.  Hand-off given to patient's nurse.   Access used: R Chest HD cath Access issues: none  Total UF removed: 1.5L   02/21/24 1451  Vitals  Temp 98.1 F (36.7 C)  Temp Source Oral  BP 116/80  Pulse Rate (!) 109  ECG Heart Rate (!) 110  Resp (!) 21  Weight 97.3 kg  Type of Weight Post-Dialysis  Oxygen Therapy  SpO2 100 %  O2 Device Room Air  During Treatment Monitoring  Duration of HD Treatment -hour(s) 3.5 hour(s)  HD Safety Checks Performed Yes  Intra-Hemodialysis Comments Tx completed  Post Treatment  Dialyzer Clearance Lightly streaked  Liters Processed 84  Fluid Removed (mL) 1500 mL  Tolerated HD Treatment Yes  Hemodialysis Catheter Right Subclavian Double lumen Permanent (Tunneled)  Placement Date/Time: 06/24/22 1644   Serial / Lot #: [id number]  Expiration Date: 09/08/26  Time Out: Correct patient;Correct site;Correct procedure  Maximum sterile barrier precautions: Hand hygiene;Mask;Cap;Sterile gloves;Sterile gown;Large sterile ...  Site Condition No complications  Blue Lumen Status Flushed;Antimicrobial dead end cap;Heparin  locked  Red Lumen Status Flushed;Antimicrobial dead end cap;Heparin  locked  Purple Lumen Status N/A  Catheter fill solution Heparin  1000 units/ml  Catheter fill volume (Arterial) 1.9 cc  Catheter fill volume (Venous) 1.9  Dressing Type Transparent  Dressing Status Antimicrobial disc/dressing in place;Clean, Dry, Intact  Interventions Dressing changed  Drainage Description None  Dressing Change Due 02/28/24  Post treatment catheter status Capped and Clamped     Camellia Brasil LPN [facility]

## 2024-02-22 DIAGNOSIS — G9340 Encephalopathy, unspecified: Secondary | ICD-10-CM | POA: Diagnosis not present

## 2024-02-22 DIAGNOSIS — R6520 Severe sepsis without septic shock: Secondary | ICD-10-CM | POA: Diagnosis not present

## 2024-02-22 DIAGNOSIS — I953 Hypotension of hemodialysis: Secondary | ICD-10-CM | POA: Diagnosis not present

## 2024-02-22 DIAGNOSIS — A419 Sepsis, unspecified organism: Secondary | ICD-10-CM | POA: Diagnosis not present

## 2024-02-22 DIAGNOSIS — N186 End stage renal disease: Secondary | ICD-10-CM | POA: Diagnosis not present

## 2024-02-22 LAB — RENAL FUNCTION PANEL
Albumin: 2.2 g/dL — ABNORMAL LOW (ref 3.5–5.0)
Anion gap: 7 (ref 5–15)
BUN: 13 mg/dL (ref 6–20)
CO2: 26 mmol/L (ref 22–32)
Calcium: 7.9 mg/dL — ABNORMAL LOW (ref 8.9–10.3)
Chloride: 98 mmol/L (ref 98–111)
Creatinine, Ser: 2.7 mg/dL — ABNORMAL HIGH (ref 0.44–1.00)
GFR, Estimated: 20 mL/min — ABNORMAL LOW (ref >60)
Glucose, Bld: 230 mg/dL — ABNORMAL HIGH (ref 70–99)
Phosphorus: 2.1 mg/dL — ABNORMAL LOW (ref 2.5–4.6)
Potassium: 4.6 mmol/L (ref 3.5–5.1)
Sodium: 132 mmol/L — ABNORMAL LOW (ref 135–145)

## 2024-02-22 LAB — CBC WITH DIFFERENTIAL/PLATELET
Abs Immature Granulocytes: 0.41 K/uL — ABNORMAL HIGH (ref 0.00–0.07)
Basophils Absolute: 0.1 K/uL (ref 0.0–0.1)
Basophils Relative: 1 %
Eosinophils Absolute: 0.3 K/uL (ref 0.0–0.5)
Eosinophils Relative: 2 %
HCT: 25.2 % — ABNORMAL LOW (ref 36.0–46.0)
Hemoglobin: 7.8 g/dL — ABNORMAL LOW (ref 12.0–15.0)
Immature Granulocytes: 2 %
Lymphocytes Relative: 17 %
Lymphs Abs: 3.2 K/uL (ref 0.7–4.0)
MCH: 28 pg (ref 26.0–34.0)
MCHC: 31 g/dL (ref 30.0–36.0)
MCV: 90.3 fL (ref 80.0–100.0)
Monocytes Absolute: 1 K/uL (ref 0.1–1.0)
Monocytes Relative: 6 %
Neutro Abs: 13.9 K/uL — ABNORMAL HIGH (ref 1.7–7.7)
Neutrophils Relative %: 72 %
Platelets: 232 K/uL (ref 150–400)
RBC: 2.79 MIL/uL — ABNORMAL LOW (ref 3.87–5.11)
RDW: 16.1 % — ABNORMAL HIGH (ref 11.5–15.5)
WBC: 18.9 K/uL — ABNORMAL HIGH (ref 4.0–10.5)
nRBC: 0 % (ref 0.0–0.2)

## 2024-02-22 LAB — GLUCOSE, CAPILLARY
Glucose-Capillary: 199 mg/dL — ABNORMAL HIGH (ref 70–99)
Glucose-Capillary: 204 mg/dL — ABNORMAL HIGH (ref 70–99)
Glucose-Capillary: 116 mg/dL — ABNORMAL HIGH (ref 70–99)
Glucose-Capillary: 105 mg/dL — ABNORMAL HIGH (ref 70–99)

## 2024-02-22 MED ORDER — ACETAMINOPHEN 325 MG PO TABS: 650.0000 mg | ORAL_TABLET | Freq: Four times a day (QID) | ORAL | Status: DC | PRN

## 2024-02-22 MED ORDER — HYDROMORPHONE HCL 1 MG/ML IJ SOLN
1.0000 mg | INTRAMUSCULAR | Status: DC | PRN
  Administered 2024-02-23: 06:00:00 1 mg via INTRAVENOUS
  Filled 2024-02-22: qty 1

## 2024-02-22 MED ORDER — MIRTAZAPINE 15 MG PO TABS
15.0000 mg | ORAL_TABLET | Freq: Every day | ORAL | Status: DC
  Administered 2024-02-23 – 2024-02-24 (×2): 15 mg
  Filled 2024-02-22 (×2): qty 1

## 2024-02-22 MED ORDER — TRAZODONE HCL 50 MG PO TABS
50.0000 mg | ORAL_TABLET | Freq: Every evening | ORAL | Status: DC | PRN
  Administered 2024-02-23: 22:00:00 50 mg via ORAL
  Filled 2024-02-22: qty 1

## 2024-02-22 MED ORDER — ONDANSETRON HCL 4 MG/2ML IJ SOLN
4.0000 mg | Freq: Four times a day (QID) | INTRAMUSCULAR | Status: DC | PRN
  Administered 2024-02-22 – 2024-02-23 (×2): 4 mg via INTRAVENOUS
  Filled 2024-02-22 (×2): qty 2

## 2024-02-22 MED ORDER — MELATONIN 5 MG PO TABS
5.0000 mg | ORAL_TABLET | Freq: Every evening | ORAL | Status: DC | PRN
  Administered 2024-02-23: 22:00:00 5 mg via ORAL
  Filled 2024-02-22 (×2): qty 1

## 2024-02-22 MED ORDER — POLYETHYLENE GLYCOL 3350 17 G PO PACK: 17.0000 g | PACK | Freq: Every day | ORAL | Status: DC | PRN

## 2024-02-22 MED ORDER — HYDROMORPHONE HCL 2 MG PO TABS
4.0000 mg | ORAL_TABLET | Freq: Four times a day (QID) | ORAL | Status: DC | PRN
  Administered 2024-02-22 – 2024-02-23 (×2): 4 mg
  Filled 2024-02-22 (×2): qty 2

## 2024-02-22 MED ORDER — HYDROXYZINE HCL 25 MG PO TABS
25.0000 mg | ORAL_TABLET | Freq: Four times a day (QID) | ORAL | Status: DC | PRN
  Administered 2024-02-23: 22:00:00 25 mg
  Filled 2024-02-22 (×2): qty 1

## 2024-02-22 NOTE — Plan of Care (Signed)
  Problem: Education: Goal: Ability to describe self-care measures that may prevent or decrease complications (Diabetes Survival Skills Education) will improve Outcome: Progressing Goal: Individualized Educational Video(s) Outcome: Progressing   Problem: Coping: Goal: Ability to adjust to condition or change in health will improve Outcome: Progressing   Problem: Fluid Volume: Goal: Ability to maintain a balanced intake and output will improve Outcome: Progressing   Problem: Health Behavior/Discharge Planning: Goal: Ability to identify and utilize available resources and services will improve Outcome: Progressing Goal: Ability to manage health-related needs will improve Outcome: Progressing   Problem: Metabolic: Goal: Ability to maintain appropriate glucose levels will improve Outcome: Progressing   Problem: Nutritional: Goal: Maintenance of adequate nutrition will improve Outcome: Progressing Goal: Progress toward achieving an optimal weight will improve Outcome: Progressing   Problem: Skin Integrity: Goal: Risk for impaired skin integrity will decrease Outcome: Progressing   Problem: Tissue Perfusion: Goal: Adequacy of tissue perfusion will improve Outcome: Progressing   Problem: Fluid Volume: Goal: Hemodynamic stability will improve Outcome: Progressing   Problem: Clinical Measurements: Goal: Diagnostic test results will improve Outcome: Progressing Goal: Signs and symptoms of infection will decrease Outcome: Progressing   Problem: Respiratory: Goal: Ability to maintain adequate ventilation will improve Outcome: Progressing   Problem: Education: Goal: Knowledge of General Education information will improve Description: Including pain rating scale, medication(s)/side effects and non-pharmacologic comfort measures Outcome: Progressing   Problem: Health Behavior/Discharge Planning: Goal: Ability to manage health-related needs will improve Outcome:  Progressing   Problem: Clinical Measurements: Goal: Ability to maintain clinical measurements within normal limits will improve Outcome: Progressing Goal: Will remain free from infection Outcome: Progressing Goal: Diagnostic test results will improve Outcome: Progressing Goal: Respiratory complications will improve Outcome: Progressing Goal: Cardiovascular complication will be avoided Outcome: Progressing   Problem: Activity: Goal: Risk for activity intolerance will decrease Outcome: Progressing   Problem: Nutrition: Goal: Adequate nutrition will be maintained Outcome: Progressing   Problem: Coping: Goal: Level of anxiety will decrease Outcome: Progressing   Problem: Elimination: Goal: Will not experience complications related to bowel motility Outcome: Progressing Goal: Will not experience complications related to urinary retention Outcome: Progressing   Problem: Pain Managment: Goal: General experience of comfort will improve and/or be controlled Outcome: Progressing   Problem: Safety: Goal: Ability to remain free from injury will improve Outcome: Progressing   Problem: Skin Integrity: Goal: Risk for impaired skin integrity will decrease Outcome: Progressing

## 2024-02-22 NOTE — TOC Progression Note (Signed)
 Transition of Care [facility]) - Progression Note    Patient Details  Name: Deborah [name] MRN: [id number] Date of Birth: [birth year]  Transition of Care [facility]) CM/SW Contact  Inocente GORMAN Kindle, LCSW Phone Number: 02/22/2024, 10:51 AM  Clinical Narrative:    CSW continuing to follow for medical stability.    Expected Discharge Plan: Skilled Nursing Facility Barriers to Discharge: Continued Medical Work up               Expected Discharge Plan and Services In-house Referral: Clinical Social Work   Post Acute Care Choice: Skilled Nursing Facility Living arrangements for the past 2 months: Skilled Nursing Facility                                       Social Drivers of Health (SDOH) Interventions SDOH Screenings   Food Insecurity: Patient Unable To Answer (02/18/2024)  Housing: Patient Declined (02/18/2024)  Transportation Needs: Patient Unable To Answer (02/18/2024)  Utilities: Not At Risk (02/20/2024)  Recent Concern: Utilities - At Risk (02/18/2024)  Depression (PHQ2-9): Medium Risk (03/16/2023)  Financial Resource Strain: Low Risk (03/23/2021)   Received [facility])  Social Connections: Low Risk (03/23/2021)   Received from [facility])  Stress: Low Risk (03/23/2021)   Received from [facility])  Tobacco Use: Low Risk (02/16/2024)    Readmission Risk Interventions    02/20/2024   11:05 AM 03/07/2023    4:19 PM 11/25/2022    5:16 PM  Readmission Risk Prevention Plan  Transportation Screening Complete Complete Complete  Medication Review (RN Care Manager) Complete Complete Complete  PCP or Specialist appointment within 3-5 days of discharge Complete Complete Complete  HRI or Home Care Consult Complete Complete Complete  SW Recovery Care/Counseling Consult Complete Complete Complete  Palliative Care Screening Not Applicable Complete Not Applicable  Skilled Nursing Facility Complete Not Applicable  Not Applicable

## 2024-02-22 NOTE — Plan of Care (Signed)
   Problem: Coping: Goal: Ability to adjust to condition or change in health will improve Outcome: Progressing

## 2024-02-22 NOTE — Progress Notes (Signed)
 Progress Note  6 Days Post-Op  Subjective:  Alert, conversant. Still having some pain but overall controlled with dilaudid . Poor PO intake on cortrak feeds. BMs have slowed down with imodium . +flatus  Afebrile, wbc 18.9 from 20.5   Objective: Vital signs in last 24 hours: Temp:  [97.4 F (36.3 C)-98.8 F (37.1 C)] 98.8 F (37.1 C) (12/17 1200) Pulse Rate:  [99-115] 99 (12/17 1200) Resp:  [13-21] 16 (12/17 1200) BP: (98-130)/(64-85) 115/73 (12/17 1200) SpO2:  [100 %] 100 % (12/17 1200) Weight:  [97.3 kg] 97.3 kg (12/16 1451) Last BM Date : 02/21/24  Intake/Output from previous day: 12/16 0701 - 12/17 0700 In: -  Out: 1500  Intake/Output this shift: No intake/output data recorded.  PE: General: alert, NAD, cortrak in place  HEENT: Head is normocephalic, atraumatic.  Heart:  Normal HR during encounter.  Skin: dry, left thigh wound as below - wound base pink and moist with interval improvement in fibrinopurulent exudate. Induration almost completely resolved, no cellulitis.   Lab Results:  Recent Labs    02/21/24 0416 02/22/24 0500  WBC 20.5* 18.9*  HGB 8.4* 7.8*  HCT 26.8* 25.2*  PLT 243 232   BMET Recent Labs    02/21/24 0416 02/22/24 0500  NA 136 132*  K 4.3 4.6  CL 101 98  CO2 20* 26  GLUCOSE 200* 230*  BUN 16 13  CREATININE 3.88* 2.70*  CALCIUM  8.0* 7.9*   PT/INR No results for input(s): LABPROT, INR in the last 72 hours. CMP     Component Value Date/Time   NA 132 (L) 02/22/2024 0500   NA 137 09/20/2019 1530   K 4.6 02/22/2024 0500   CL 98 02/22/2024 0500   CO2 26 02/22/2024 0500   GLUCOSE 230 (H) 02/22/2024 0500   BUN 13 02/22/2024 0500   BUN 18 09/20/2019 1530   CREATININE 2.70 (H) 02/22/2024 0500   CALCIUM  7.9 (L) 02/22/2024 0500   PROT 4.8 (L) 02/15/2024 0836   ALBUMIN  2.2 (L) 02/22/2024 0500   AST 10 (L) 02/15/2024 0836   ALT 8 02/15/2024 0836   ALKPHOS 87 02/15/2024 0836   BILITOT 0.9 02/15/2024 0836   GFRNONAA 20 (L)  02/22/2024 0500   GFRAA 49 (L) 11/30/2019 0356   Lipase     Component Value Date/Time   LIPASE 24 06/03/2023 0905       Studies/Results: No results found.   Anti-infectives: Anti-infectives (From admission, onward)    Start     Dose/Rate Route Frequency Ordered Stop   02/21/24 1200  vancomycin  (VANCOCIN ) IVPB 1000 mg/200 mL premix       Placed in Followed by Linked Group   1,000 mg 200 mL/hr over 60 Minutes Intravenous Every T-Th-Sa (Hemodialysis) 02/20/24 1458     02/21/24 1200  ceFAZolin  (ANCEF ) IVPB 2g/100 mL premix       Placed in Followed by Linked Group   2 g 200 mL/hr over 30 Minutes Intravenous Every T-Th-Sa (Hemodialysis) 02/20/24 1458     02/20/24 2200  metroNIDAZOLE  (FLAGYL ) tablet 500 mg        500 mg Oral Every 12 hours 02/20/24 1458     02/20/24 1545  vancomycin  (VANCOREADY) IVPB 2000 mg/400 mL       Placed in Followed by Linked Group   2,000 mg 200 mL/hr over 120 Minutes Intravenous  Once 02/20/24 1458 02/20/24 1818   02/20/24 1545  ceFAZolin  (ANCEF ) IVPB 2g/100 mL premix       Placed in Followed  by Linked Group   2 g 200 mL/hr over 30 Minutes Intravenous  Once 02/20/24 1458 02/20/24 1621   02/18/24 1800  meropenem  (MERREM ) 500 mg in sodium chloride  0.9 % 100 mL IVPB  Status:  Discontinued        500 mg 200 mL/hr over 30 Minutes Intravenous Every 24 hours 02/17/24 0941 02/20/24 1458   02/17/24 1030  meropenem  (MERREM ) 500 mg in sodium chloride  0.9 % 100 mL IVPB        500 mg 200 mL/hr over 30 Minutes Intravenous  Once 02/17/24 0941 02/17/24 1059   02/17/24 1000  meropenem  (MERREM ) 500 mg in sodium chloride  0.9 % 100 mL IVPB  Status:  Discontinued        500 mg 200 mL/hr over 30 Minutes Intravenous Every 24 hours 02/16/24 1045 02/17/24 0941   02/16/24 1145  linezolid  (ZYVOX ) IVPB 600 mg  Status:  Discontinued        600 mg 300 mL/hr over 60 Minutes Intravenous Every 12 hours 02/16/24 1045 02/20/24 1458   02/16/24 1145  meropenem  (MERREM ) 500 mg  in sodium chloride  0.9 % 100 mL IVPB  Status:  Discontinued        500 mg 200 mL/hr over 30 Minutes Intravenous  Once 02/16/24 1045 02/17/24 0941   02/15/24 2000  ceFEPIme  (MAXIPIME ) 1 g in sodium chloride  0.9 % 100 mL IVPB  Status:  Discontinued        1 g 200 mL/hr over 30 Minutes Intravenous Every 24 hours 02/14/24 1649 02/16/24 1045   02/15/24 1700  vancomycin  (VANCOCIN ) IVPB 1000 mg/200 mL premix        1,000 mg 200 mL/hr over 60 Minutes Intravenous  Once 02/15/24 1528 02/15/24 2015   02/15/24 1415  vancomycin  variable dose per unstable renal function (pharmacist dosing)  Status:  Discontinued         Does not apply See admin instructions 02/15/24 1415 02/16/24 1045   02/15/24 0600  metroNIDAZOLE  (FLAGYL ) IVPB 500 mg  Status:  Discontinued        500 mg 100 mL/hr over 60 Minutes Intravenous Every 12 hours 02/14/24 2305 02/16/24 1046   02/14/24 1445  aztreonam  (AZACTAM ) 2 g in sodium chloride  0.9 % 100 mL IVPB  Status:  Discontinued        2 g 200 mL/hr over 30 Minutes Intravenous  Once 02/14/24 1435 02/14/24 1440   02/14/24 1445  metroNIDAZOLE  (FLAGYL ) IVPB 500 mg        500 mg 100 mL/hr over 60 Minutes Intravenous  Once 02/14/24 1435 02/14/24 1844   02/14/24 1445  vancomycin  (VANCOCIN ) IVPB 1000 mg/200 mL premix  Status:  Discontinued        1,000 mg 200 mL/hr over 60 Minutes Intravenous  Once 02/14/24 1435 02/14/24 1444   02/14/24 1445  ceFEPIme  (MAXIPIME ) 2 g in sodium chloride  0.9 % 100 mL IVPB        2 g 200 mL/hr over 30 Minutes Intravenous  Once 02/14/24 1444 02/14/24 1606   02/14/24 1445  vancomycin  (VANCOREADY) IVPB 2000 mg/400 mL        2,000 mg 200 mL/hr over 120 Minutes Intravenous  Once 02/14/24 1444 02/14/24 1848        Assessment/Plan Left thigh abscess POD6: S/P I&D 12/11 Dr. Vernetta - Afebrile. - WBC 18 from 20 - Continue twice daily moist-to-dry dressing changes or when soiled.  - ok to stop abx after 7 days from surgical standpoint - no role for  further debridement at present, we will sign off. Patient may follow up in our office as needed.    FEN REg diet, cortrak w/ TF ID: merrem , linezolid  VTE: SQH   Encephalopathy - EEG w/ generalized dysfunction, no seizures  ESRD on HD Anemia of ESRD Chronic hypotension DM2 HLD Afib - in sinus here Gout Depression/anxiety    LOS: 8 days   I reviewed specialist notes, consulting provider notes, nursing notes, hospitalist notes, last 24 h vitals and pain scores, last 48 h intake and output, last 24 h labs and trends, and last 24 h imaging results.   Almarie GORMAN Pringle, PA-C  [facility] 02/22/2024, 12:25 PM Please see Amion for pager number during day hours 7:00am-4:30pm

## 2024-02-22 NOTE — Progress Notes (Signed)
 [facility] night cross cover note:   I was notified by the patient's RN that the patient currently has two separate orders for prn iv Dilaudid  and that the patient is refusing to try the existing order for as needed p.o. Dilaudid , with the patient noting that the IV form Dilaudid  also helps her sleep.   After reviewing existing orders for Dilaudid , I kept the more recent order for as needed IV Dilaudid  and discontinued the older order for Dilaudid  0.5 mg IV every 8 hours prn.   Additionally, I modified her remaining prn IV Dilaudid  order to reflect updated indication of for severe pain refractory to PO Dilaudid .   I also added order for prn trazodone  for sleep as well as an order for as needed melatonin for insomnia refractory to trazodone .    Deborah Pore, DO Hospitalist

## 2024-02-22 NOTE — Progress Notes (Signed)
 [facility] night cross cover note:  Regarding this patient with cortrak undergoing tube feeds, the patient developed nausea/vomiting, during which TF's were held and cortrak was removed.  Patient was intended to receive additional tube feeds this evening.  If we have cortrak team available this evening, I have asked for cortrak to be replaced.  I have also added as needed IV Zofran  for the patient's residual nausea.    Eva Pore, DO Hospitalist

## 2024-02-22 NOTE — Inpatient Diabetes Management (Addendum)
 Inpatient Diabetes Program Recommendations  AACE/ADA: New Consensus Statement on Inpatient Glycemic Control (2015)  Target Ranges:  Prepandial:   less than 140 mg/dL      Peak postprandial:   less than 180 mg/dL (1-2 hours)      Critically ill patients:  140 - 180 mg/dL    Latest Reference Range & Units 02/20/24 23:20 02/21/24 03:08 02/21/24 08:11 02/21/24 10:15 02/21/24 16:24 02/21/24 20:12  Glucose-Capillary 70 - 99 mg/dL 825 (H) 817 (H) 785 (H) 224 (H) 164 (H) 192 (H)  (H): Data is abnormally high  Latest Reference Range & Units 02/21/24 23:15 02/22/24 03:41 02/22/24 07:35  Glucose-Capillary 70 - 99 mg/dL 790 (H) 800 (H) 795 (H)  (H): Data is abnormally high   Home DM Meds: Novolog  1-6 units TID  Current Orders: Novolog  0-6 units Q4H    MD- Note pt getting Tube feeds 80cc/hr from 6pm to 6am daily  Please consider adding Novolog  Tube Feed coverage overnight only:  Novolog  3 units for 8pm, 12am, 4am only    --Will follow patient during hospitalization--  Adina Rudolpho Arrow RN, MSN, CDCES Diabetes Coordinator Inpatient Glycemic Control Team Team Pager: [phone] (8a-5p)

## 2024-02-22 NOTE — Progress Notes (Signed)
 PTS DRESSINGS REMOVED AND DRESSINGS CHANGED AT THIS TIME. WOUNDS UPLOADED TO MEDIA.

## 2024-02-22 NOTE — Progress Notes (Signed)
 Assumed care 1900. Upon entering room patient upset asking for pain medicine. Pt refusing to take po pain medicine and demanding IV dilaudid  only. Currently had two orders and reached out to MD for clarification.   Patient eating po meals. Currently eating chicken tenders for dinner. Also on TF at 55cc/hr.   MD changed orders and RN went in to educate again and offer po dilaudid  and patient became verbally aggressive towards RN. Education attempted again. Patient yelling I'm not waiting hours, I take this at home I know how long it takes give me 2mg  IV dilaudid . Education provided again on po vs IV pain meds, as well as, new orders.   Patient agreeable to take po but refusing to swallow pills as she is eating her chicken tenders. Demanding RN to 'put it through the food.' RN crushed meds and while pushing through coretrak patient started yelling at RN again stating you just had to dilute it with water to make it less than 4mg  didn't you. Education provided again on po medication. RN also reminded patient she refused to swallow pill and demanded it go through the TF.   Medication given, patient in bed eating dinner. No sign of distress.

## 2024-02-22 NOTE — Progress Notes (Signed)
 [facility] Progress Note   Subjective:    Seen and examined patient at bedside. She reports feeling well with no acute complaints. Tolerated yesterday's HD with net UF 1.5L. Noted Surgery has signed off. Next HD 12/18.  Objective Vitals:   02/22/24 0342 02/22/24 0416 02/22/24 0736 02/22/24 1200  BP: 101/64 110/68 108/66 115/73  Pulse:  (!) 108 99 99  Resp:   17 16  Temp: 98.4 F (36.9 C)  98.6 F (37 C) 98.8 F (37.1 C)  TempSrc: Oral  Axillary Oral  SpO2:   100% 100%  Weight:      Height:       Physical Exam General: Chronically ill appearing female in NAD Heart: RRR, no murmurs Lungs: CTA bilaterally, respirations unlabored on RA Abdomen: Soft, non-distended, +BS Extremities: no edema b/l lower extremities Dialysis [facility]: [facility]  Filed Weights   02/21/24 0500 02/21/24 1116 02/21/24 1451  Weight: 98.5 kg (S) 98.8 kg 97.3 kg   No intake or output data in the 24 hours ending 02/22/24 1513  Additional Objective Labs: Basic Metabolic Panel: Recent Labs  Lab 02/20/24 0147 02/21/24 0416 02/22/24 0500  NA 134* 136 132*  K 3.7 4.3 4.6  CL 99 101 98  CO2 26 20* 26  GLUCOSE 201* 200* 230*  BUN 9 16 13   CREATININE 2.83* 3.88* 2.70*  CALCIUM  7.1* 8.0* 7.9*  PHOS 2.8 2.9 2.1*   Liver Function Tests: Recent Labs  Lab 02/20/24 0147 02/21/24 0416 02/22/24 0500  ALBUMIN  1.6* 1.7* 2.2*   No results for input(s): LIPASE, AMYLASE in the last 168 hours. CBC: Recent Labs  Lab 02/18/24 0321 02/19/24 0316 02/20/24 0147 02/21/24 0416 02/22/24 0500  WBC 24.6* 18.5* 19.9* 20.5* 18.9*  NEUTROABS  --   --   --   --  13.9*  HGB 7.8* 7.5* 7.9* 8.4* 7.8*  HCT 23.7* 23.2* 24.9* 26.8* 25.2*  MCV 86.2 86.2 87.4 89.6 90.3  PLT 173 182 212 243 232   Blood Culture    Component Value Date/Time   SDES FLUID 02/16/2024 1430   SPECREQUEST LEFT THIGH ABSCESS 02/16/2024 1430   CULT  02/16/2024 1430    MODERATE PROTEUS MIRABILIS FEW ENTEROCOCCUS AVIUM FEW  PREVOTELLA BUCCAE BETA LACTAMASE POSITIVE Performed at [facility], [address], [place], [place] [address]    REPTSTATUS 02/20/2024 FINAL 02/16/2024 1430    Cardiac Enzymes: No results for input(s): CKTOTAL, CKMB, CKMBINDEX, TROPONINI in the last 168 hours. CBG: Recent Labs  Lab 02/21/24 2012 02/21/24 2315 02/22/24 0341 02/22/24 0735 02/22/24 1201  GLUCAP 192* 209* 199* 204* 116*   Iron  Studies: No results for input(s): IRON , TIBC, TRANSFERRIN, FERRITIN in the last 72 hours. Lab Results  Component Value Date   INR 1.5 (H) 03/22/2023   INR 1.9 (H) 12/15/2022   INR 1.8 (H) 11/23/2022   Studies/Results: No results found.  Medications:   ceFAZolin  (ANCEF ) IV 2 g (02/21/24 1609)   vancomycin  1,000 mg (02/21/24 1608)    allopurinol   100 mg Per Tube BID   atorvastatin   10 mg Per Tube QPM   cholestyramine  light  4 g Per Tube BID   famotidine   20 mg Per Tube QAC breakfast   feeding supplement (OSMOLITE 1.5 CAL)  960 mL Per Tube Q24H   ferric citrate   420 mg Oral TID WC   folic acid   1 mg Per Tube Daily   Gerhardt's butt cream   Topical TID   heparin   5,000 Units Subcutaneous  Q8H   hydrocerin  1 Application Topical BID   hyoscyamine   0.125 mg Sublingual TID   insulin  aspart  0-6 Units Subcutaneous Q4H   metroNIDAZOLE   500 mg Oral Q12H   midodrine   10 mg Per Tube TID   mirtazapine   15 mg Per Tube QHS   pantoprazole   40 mg Oral Daily   risperiDONE   1 mg Oral QHS   thiamine   200 mg Per Tube Daily    Dialysis Orders: [place] [facility] 4:00 400BFR EDW 96.1 kg 3K/2.5Ca TDC No bolus heparin    Assessment/Plan:  AMS/ possible sepsis: WBC remain elevated, on antibiotics per primary team. Changed from cefepime  to meropenem .  L thigh abscess. s/p I&D on 02/16/24. Surgery sogned off today. ESRD: on HD TTS. HD Tuesday per regular schedule. Cr and BUN are now low but no UOP? Unclear why BUN would be so low. Will continue to follow. Next HD 12/18. Hypotension.   Has chronic low BP's takes midodrine  pre HD three times per week. Continue here.  Volume: Bps improving, does not appear overloaded. UFG set 1-1.5L as tolerated. Anemia. Hgb 7.5,  Mircera 225  given 12/6 at OP clinic, next dose due 12/20.  Hypokalemia/hypophos: After tube feeds initiated, receiving supplementation.   [name], NP [facility] 02/22/2024,3:13 PM  LOS: 8 days

## 2024-02-23 ENCOUNTER — Other Ambulatory Visit (HOSPITAL_COMMUNITY): Payer: Self-pay

## 2024-02-23 ENCOUNTER — Inpatient Hospital Stay (HOSPITAL_COMMUNITY)

## 2024-02-23 DIAGNOSIS — G9340 Encephalopathy, unspecified: Secondary | ICD-10-CM | POA: Diagnosis not present

## 2024-02-23 DIAGNOSIS — A419 Sepsis, unspecified organism: Secondary | ICD-10-CM | POA: Diagnosis not present

## 2024-02-23 DIAGNOSIS — I953 Hypotension of hemodialysis: Secondary | ICD-10-CM | POA: Diagnosis not present

## 2024-02-23 DIAGNOSIS — N186 End stage renal disease: Secondary | ICD-10-CM | POA: Diagnosis not present

## 2024-02-23 LAB — CBC
HCT: 25.5 % — ABNORMAL LOW (ref 36.0–46.0)
Hemoglobin: 8 g/dL — ABNORMAL LOW (ref 12.0–15.0)
MCH: 29 pg (ref 26.0–34.0)
MCHC: 31.4 g/dL (ref 30.0–36.0)
MCV: 92.4 fL (ref 80.0–100.0)
Platelets: 254 K/uL (ref 150–400)
RBC: 2.76 MIL/uL — ABNORMAL LOW (ref 3.87–5.11)
RDW: 16.4 % — ABNORMAL HIGH (ref 11.5–15.5)
WBC: 21.5 K/uL — ABNORMAL HIGH (ref 4.0–10.5)
nRBC: 0 % (ref 0.0–0.2)

## 2024-02-23 LAB — GLUCOSE, CAPILLARY
Glucose-Capillary: 113 mg/dL — ABNORMAL HIGH (ref 70–99)
Glucose-Capillary: 121 mg/dL — ABNORMAL HIGH (ref 70–99)
Glucose-Capillary: 127 mg/dL — ABNORMAL HIGH (ref 70–99)
Glucose-Capillary: 134 mg/dL — ABNORMAL HIGH (ref 70–99)
Glucose-Capillary: 135 mg/dL — ABNORMAL HIGH (ref 70–99)
Glucose-Capillary: 137 mg/dL — ABNORMAL HIGH (ref 70–99)
Glucose-Capillary: 146 mg/dL — ABNORMAL HIGH (ref 70–99)
Glucose-Capillary: 184 mg/dL — ABNORMAL HIGH (ref 70–99)

## 2024-02-23 LAB — RENAL FUNCTION PANEL
Albumin: 2.3 g/dL — ABNORMAL LOW (ref 3.5–5.0)
Anion gap: 7 (ref 5–15)
BUN: 20 mg/dL (ref 6–20)
CO2: 27 mmol/L (ref 22–32)
Calcium: 8.4 mg/dL — ABNORMAL LOW (ref 8.9–10.3)
Chloride: 100 mmol/L (ref 98–111)
Creatinine, Ser: 3.82 mg/dL — ABNORMAL HIGH (ref 0.44–1.00)
GFR, Estimated: 13 mL/min — ABNORMAL LOW (ref >60)
Glucose, Bld: 157 mg/dL — ABNORMAL HIGH (ref 70–99)
Phosphorus: 3.3 mg/dL (ref 2.5–4.6)
Potassium: 4.8 mmol/L (ref 3.5–5.1)
Sodium: 133 mmol/L — ABNORMAL LOW (ref 135–145)

## 2024-02-23 MED ORDER — HYDROMORPHONE HCL 1 MG/ML IJ SOLN
1.0000 mg | INTRAMUSCULAR | Status: DC | PRN
  Administered 2024-02-23 – 2024-02-24 (×2): 1 mg via INTRAVENOUS
  Administered 2024-02-24 (×3): 2 mg via INTRAVENOUS
  Administered 2024-02-25: 1 mg via INTRAVENOUS
  Administered 2024-02-25: 2 mg via INTRAVENOUS
  Administered 2024-02-25: 1 mg via INTRAVENOUS
  Filled 2024-02-23 (×2): qty 2
  Filled 2024-02-23: qty 1
  Filled 2024-02-23: qty 2
  Filled 2024-02-23 (×2): qty 1
  Filled 2024-02-23: qty 2

## 2024-02-23 MED ORDER — HEPARIN SODIUM (PORCINE) 1000 UNIT/ML DIALYSIS: 1000.0000 [IU] | INTRAMUSCULAR | Status: DC | PRN

## 2024-02-23 MED ORDER — HEPARIN SODIUM (PORCINE) 1000 UNIT/ML IJ SOLN
INTRAMUSCULAR | Status: AC
  Filled 2024-02-23: qty 4

## 2024-02-23 MED ORDER — LIDOCAINE-PRILOCAINE 2.5-2.5 % EX CREA: 1.0000 | TOPICAL_CREAM | CUTANEOUS | Status: DC | PRN

## 2024-02-23 MED ORDER — LIDOCAINE HCL (PF) 1 % IJ SOLN: 5.0000 mL | INTRAMUSCULAR | Status: DC | PRN

## 2024-02-23 MED ORDER — CEFAZOLIN SODIUM-DEXTROSE 2-4 GM/100ML-% IV SOLN
INTRAVENOUS | Status: AC
  Filled 2024-02-23: qty 100

## 2024-02-23 MED ORDER — MIDODRINE HCL 5 MG PO TABS
ORAL_TABLET | ORAL | Status: AC
  Filled 2024-02-23: qty 2

## 2024-02-23 MED ORDER — VANCOMYCIN HCL IN DEXTROSE 1-5 GM/200ML-% IV SOLN
INTRAVENOUS | Status: AC
  Filled 2024-02-23: qty 200

## 2024-02-23 MED ORDER — PENTAFLUOROPROP-TETRAFLUOROETH EX AERO: 1.0000 | INHALATION_SPRAY | CUTANEOUS | Status: DC | PRN

## 2024-02-23 MED ORDER — ALTEPLASE 2 MG IJ SOLR: 2.0000 mg | Freq: Once | INTRAMUSCULAR | Status: DC | PRN

## 2024-02-23 MED ORDER — HYDROCODONE-ACETAMINOPHEN 5-325 MG PO TABS
1.0000 | ORAL_TABLET | ORAL | Status: DC | PRN
  Administered 2024-02-23: 14:00:00 2 via ORAL
  Administered 2024-02-23: 22:00:00 1 via ORAL
  Filled 2024-02-23: qty 1
  Filled 2024-02-23: qty 2

## 2024-02-23 MED ADMIN — Heparin Sodium (Porcine) Inj 1000 Unit/ML: 3800 [IU] | INTRAVENOUS_CENTRAL | @ 13:00:00 | NDC 71288040201

## 2024-02-23 NOTE — Progress Notes (Signed)
 [facility] Progress Note   Subjective:    Seen and examined patient on HD. Tolerating UFG 2L and BP is 99/73. She denies any acute issues.  Objective Vitals:   02/23/24 0930 02/23/24 1000 02/23/24 1018 02/23/24 1034  BP: 99/73 (!) 86/69 100/63 (!) (P) 75/30  Pulse: (!) 108 (!) 121    Resp: (!) 27 18 (!) 23 (!) (P) 25  Temp:      TempSrc:      SpO2: 100% 100%    Weight:      Height:       Physical Exam General: Chronically ill appearing female in NAD Heart: RRR, no murmurs Lungs: CTA bilaterally, respirations unlabored on RA Abdomen: Soft and non-distended Extremities: no edema b/l lower extremities Dialysis Access: [facility]  Filed Weights   02/21/24 1116 02/21/24 1451 02/23/24 0832  Weight: (S) 98.8 kg 97.3 kg 98.5 kg    Intake/Output Summary (Last 24 hours) at 02/23/2024 1133 Last data filed at 02/22/2024 1945 Gross per 24 hour  Intake 40 ml  Output --  Net 40 ml    Additional Objective Labs: Basic Metabolic Panel: Recent Labs  Lab 02/21/24 0416 02/22/24 0500 02/23/24 0840  NA 136 132* 133*  K 4.3 4.6 4.8  CL 101 98 100  CO2 20* 26 27  GLUCOSE 200* 230* 157*  BUN 16 13 20   CREATININE 3.88* 2.70* 3.82*  CALCIUM  8.0* 7.9* 8.4*  PHOS 2.9 2.1* 3.3   Liver Function Tests: Recent Labs  Lab 02/21/24 0416 02/22/24 0500 02/23/24 0840  ALBUMIN  1.7* 2.2* 2.3*   No results for input(s): LIPASE, AMYLASE in the last 168 hours. CBC: Recent Labs  Lab 02/19/24 0316 02/20/24 0147 02/21/24 0416 02/22/24 0500 02/23/24 0840  WBC 18.5* 19.9* 20.5* 18.9* 21.5*  NEUTROABS  --   --   --  13.9*  --   HGB 7.5* 7.9* 8.4* 7.8* 8.0*  HCT 23.2* 24.9* 26.8* 25.2* 25.5*  MCV 86.2 87.4 89.6 90.3 92.4  PLT 182 212 243 232 254   Blood Culture    Component Value Date/Time   SDES FLUID 02/16/2024 1430   SPECREQUEST LEFT THIGH ABSCESS 02/16/2024 1430   CULT  02/16/2024 1430    MODERATE PROTEUS MIRABILIS FEW ENTEROCOCCUS AVIUM FEW PREVOTELLA  BUCCAE BETA LACTAMASE POSITIVE Performed at [facility], [address] [place], [place] [address]    REPTSTATUS 02/20/2024 FINAL 02/16/2024 1430    Cardiac Enzymes: No results for input(s): CKTOTAL, CKMB, CKMBINDEX, TROPONINI in the last 168 hours. CBG: Recent Labs  Lab 02/22/24 1601 02/22/24 1945 02/23/24 0023 02/23/24 0358 02/23/24 0745  GLUCAP 105* 184* 135* 127* 134*   Iron  Studies: No results for input(s): IRON , TIBC, TRANSFERRIN, FERRITIN in the last 72 hours. Lab Results  Component Value Date   INR 1.5 (H) 03/22/2023   INR 1.9 (H) 12/15/2022   INR 1.8 (H) 11/23/2022   Studies/Results: No results found.  Medications:   ceFAZolin  (ANCEF ) IV 2 g (02/21/24 1609)   vancomycin  1,000 mg (02/21/24 1608)    allopurinol   100 mg Per Tube BID   atorvastatin   10 mg Per Tube QPM   cholestyramine  light  4 g Per Tube BID   famotidine   20 mg Per Tube QAC breakfast   feeding supplement (OSMOLITE 1.5 CAL)  960 mL Per Tube Q24H   ferric citrate   420 mg Oral TID WC   folic acid   1 mg Per Tube Daily   Gerhardt's butt cream   Topical  TID   heparin   5,000 Units Subcutaneous Q8H   hydrocerin  1 Application Topical BID   hyoscyamine   0.125 mg Sublingual TID   insulin  aspart  0-6 Units Subcutaneous Q4H   metroNIDAZOLE   500 mg Oral Q12H   midodrine   10 mg Per Tube TID   mirtazapine   15 mg Per Tube QHS   pantoprazole   40 mg Oral Daily   risperiDONE   1 mg Oral QHS   thiamine   200 mg Per Tube Daily    Dialysis Orders: [place] [facility] 4:00 400BFR EDW 96.1 kg 3K/2.5Ca TDC No bolus heparin    Assessment/Plan: AMS/ possible sepsis: WBC remain elevated, on antibiotics per primary team. On Cefazolin  2GM with HD and Flagyl  500mg  BID. L thigh abscess. s/p I&D on 02/16/24. See above on ABXs. Surgery sogned off 12/17. ESRD: on HD TTS. HD Tuesday per regular schedule. Cr and BUN are now low but no UOP? Unclear why BUN would be so low. Will continue to follow. On  HD. Hypotension.  Has chronic low BP's takes midodrine  pre HD three times per week. Continue here.  Volume: Bps improving, does not appear overloaded. UFG set 1-1.5L as tolerated. Anemia. Hgb 7.5,  Mircera 225  given 12/6 at OP clinic, next dose due 12/20.  Hypokalemia/hypophos: After tube feeds initiated, receiving supplementation.   [name], NP [facility] 02/23/2024,11:33 AM  LOS: 9 days

## 2024-02-23 NOTE — Progress Notes (Signed)
 VAST was consult to remove CVC and establish PIV access. Pts left arm is restricted, thus only R arm is available for PIV placement. Most vessels are either too deep or too small for PIV placement. RUA Cephalic vein assessed and seemed appropriate for IV. 1x attempt to establish IV without success. No other suitable veins.  Recommending maintain CVC access until discharge.

## 2024-02-23 NOTE — Progress Notes (Signed)
 Woke pt up from deep sleep to do dressing change and heparin  shot. Pt stating can I have it yet. IV dilaudid  given for dressing change, see eMAR.

## 2024-02-23 NOTE — Progress Notes (Signed)
 Pt cleaned up, bath given. Pt declining wound change, states she will tell RN when she is ready. Asked for imodium . Patient finished entire pudding before RN returned with med. Took imodium  whole with water without issue. Resting in bed. Denies needs. No signs of distress.

## 2024-02-23 NOTE — TOC Progression Note (Signed)
 Transition of Care [facility]) - Progression Note    Patient Details  Name: Deborah [name] MRN: [id number] Date of Birth: [birth year]  Transition of Care [facility]) CM/SW Contact  Inocente GORMAN [place], [place] Phone Number: 02/23/2024, 3:20 PM  Clinical Narrative:    Updated [facility] that patient may be ready for discharge tomorrow.    Expected Discharge Plan: Skilled Nursing Facility Barriers to Discharge: Continued Medical Work up               Expected Discharge Plan and Services In-house Referral: Clinical Social Work   Post Acute Care Choice: Skilled Nursing Facility Living arrangements for the past 2 months: Skilled Nursing Facility                                       Social Drivers of Health (SDOH) Interventions SDOH Screenings   Food Insecurity: Patient Unable To Answer (02/18/2024)  Housing: Patient Declined (02/18/2024)  Transportation Needs: Patient Unable To Answer (02/18/2024)  Utilities: Not At Risk (02/20/2024)  Recent Concern: Utilities - At Risk (02/18/2024)  Depression (PHQ2-9): Medium Risk (03/16/2023)  Financial Resource Strain: Low Risk (03/23/2021)   Received from [facility])  Social Connections: Low Risk (03/23/2021)   Received from [facility])  Stress: Low Risk (03/23/2021)   Received from [facility])  Tobacco Use: Low Risk (02/16/2024)    Readmission Risk Interventions    02/20/2024   11:05 AM 03/07/2023    4:19 PM 11/25/2022    5:16 PM  Readmission Risk Prevention Plan  Transportation Screening Complete Complete Complete  Medication Review (RN Care Manager) Complete Complete Complete  PCP or Specialist appointment within 3-5 days of discharge Complete Complete Complete  HRI or Home Care Consult Complete Complete Complete  SW Recovery Care/Counseling Consult Complete Complete Complete  Palliative Care Screening Not Applicable Complete Not Applicable  Skilled Nursing Facility  Complete Not Applicable Not Applicable

## 2024-02-23 NOTE — [place] FL2 (Signed)
 [place]  MEDICAID FL2 LEVEL OF CARE FORM     IDENTIFICATION  Patient Name: Deborah [name] Birthdate: 02/14/1966 Sex: female Admission Date (Current Location): 02/14/2024  [facility] and [place] Number:  Producer, Television/film/video and Address:  [facility] [place], [address], [place], [place] [address]      Provider Number: [id number]  Attending Physician Name and Address:  Raenelle Donalda CHRISTELLA, MD  Relative Name and Phone Number:       Current Level of Care: Hospital Recommended Level of Care: Skilled Nursing Facility Prior Approval Number:    Date Approved/Denied:   PASRR Number: [id number]  Discharge Plan: SNF    Current Diagnoses: Patient Active Problem List   Diagnosis Date Noted   Altered mental status 02/14/2024   Obesity, class 2 06/09/2023   Acute on chronic diastolic CHF (congestive heart failure) (HCC) 05/29/2023   Gastroparesis 05/29/2023   Chronic wound of left heel 05/29/2023   Amputation of right lower extremity below knee (HCC) 04/30/2023   Non-pressure chronic ulcer of other part of left foot limited to breakdown of skin (HCC) 04/30/2023   Dysphagia 04/28/2023   Volume overload 04/25/2023   Chronic diastolic CHF (congestive heart failure) (HCC) 04/25/2023   Carcinoid tumor of abdomen (HCC) 04/25/2023   PVD (peripheral vascular disease) 04/25/2023   History of GI bleed 04/25/2023   History of arteriovenous malformation (AVM) 04/25/2023   Generalized anxiety disorder 04/25/2023   History of reactive airway disease 04/25/2023   History of spinal stenosis 04/25/2023   Chronic diabetic ulcer of left foot determined by examination (HCC) 04/25/2023   Diarrhea 03/01/2023   Intractable diarrhea 02/28/2023   Sinus tachycardia 02/17/2023   Cellulitis of heel, left 02/17/2023   Hx of BKA, right (HCC) 01/31/2023   Tardive dyskinesia 01/06/2023   Peutz-Jeghers polyps of small bowel (HCC) 12/24/2022   Gastric AVM 12/23/2022   Anemia of  chronic disease 12/14/2022   Pressure injury of skin 11/30/2022   Non-pressure chronic ulcer of right heel and midfoot limited to breakdown of skin (HCC) 11/24/2022   Severe sepsis (HCC) 11/23/2022   ESRD on dialysis (HCC) 09/09/2022   Peripheral neuropathy 09/09/2022   (HFpEF) heart failure with preserved ejection fraction (HCC) 09/09/2022   CHF (congestive heart failure) (HCC) 07/13/2022   Hyponatremia 05/29/2022   Leukocytosis 05/11/2022   Edema of left upper extremity 03/21/2022   Acute pyelonephritis    Paroxysmal atrial fibrillation (HCC)    Chronic pain syndrome/chronic abdominal pain 09/04/2019   Malignant carcinoid tumor of duodenum (HCC)    NASH (nonalcoholic steatohepatitis) 06/05/2019   Chronic diarrhea with history of C.Diff    Restrictive lung disease secondary to obesity    History of gastric ulcer    Fibroid uterus 02/23/2019   Abdominal pain 07/17/2018   Anxiety 11/29/2017   Spinal stenosis, lumbar region with neurogenic claudication 08/03/2017   GERD (gastroesophageal reflux disease) 03/19/2017   Chronic depression/anxiety 03/19/2017   Morbid obesity (HCC)    Normocytic anemia 08/16/2016   Chronic gout 06/05/2016   Chronic hypokalemia 09/26/2015   Hypomagnesemia and hypokalemia 09/26/2015   Insulin  dependent type 2 diabetes mellitus (HCC) 05/25/2015   Essential hypertension 09/28/2013    Orientation RESPIRATION BLADDER Height & Weight     Self, Time, Situation, Place  Normal Incontinent Weight: 213 lb 3 oz (96.7 kg) Height:  5' 6 (167.6 cm)  BEHAVIORAL SYMPTOMS/MOOD NEUROLOGICAL BOWEL NUTRITION STATUS      Incontinent Diet (See dc summary)  AMBULATORY STATUS  COMMUNICATION OF NEEDS Skin   Extensive Assist Verbally Surgical wounds (surgical incision on groin; unstageable on foot)                       Personal Care Assistance Level of Assistance  Bathing, Feeding, Dressing Bathing Assistance: Maximum assistance Feeding assistance: Limited  assistance Dressing Assistance: Limited assistance     Functional Limitations Info             SPECIAL CARE FACTORS FREQUENCY                       Contractures Contractures Info: Not present    Additional Factors Info  Code Status, Allergies Code Status Info: Full Allergies Info: Gabapentin , Isovue  (Iopamidol ), Nsaids, Penicillins, Reglan  (Metoclopramide ), Valium  (Diazepam ), Zestril (Lisinopril), Tolectin (Tolmetin), Asa (Aspirin ), Aspartame And Phenylalanine, Bentyl  (Dicyclomine ), Hibiclens  (Chlorhexidine  Gluconate), Flexeril  (Cyclobenzaprine ), Oxycontin  (Oxycodone ), Rifamycins, Tylenol  (Acetaminophen ), Ultram  (Tramadol )           Current Medications (02/23/2024):  This is the current hospital active medication list Current Facility-Administered Medications  Medication Dose Route Frequency Provider Last Rate Last Admin   acetaminophen  (TYLENOL ) tablet 650 mg  650 mg Per Tube Q6H PRN Paytes, Austin A, RPH       albuterol  (PROVENTIL ) (2.5 MG/3ML) 0.083% nebulizer solution 2.5 mg  2.5 mg Nebulization Q6H PRN Johnson, Kelly R, PA-C       allopurinol  (ZYLOPRIM ) tablet 100 mg  100 mg Per Tube BID Elgergawy, Dawood S, MD   100 mg at 02/23/24 1330   atorvastatin  (LIPITOR) tablet 10 mg  10 mg Per Tube QPM Elgergawy, Brayton RAMAN, MD   10 mg at 02/22/24 1705   ceFAZolin  (ANCEF ) IVPB 2g/100 mL premix  2 g Intravenous Q T,Th,Sa-HD Elgergawy, Dawood S, MD   Stopped at 02/23/24 1254   cholestyramine  light (PREVALITE ) packet 4 g  4 g Per Tube BID Elgergawy, Dawood S, MD   4 g at 02/23/24 1332   famotidine  (PEPCID ) tablet 20 mg  20 mg Per Tube QAC breakfast Elgergawy, Dawood S, MD   20 mg at 02/23/24 1330   feeding supplement (OSMOLITE 1.5 CAL) liquid 960 mL  960 mL Per Tube Q24H Elgergawy, Brayton RAMAN, MD   960 mL at 02/22/24 1704   ferric citrate  (AURYXIA ) tablet 420 mg  420 mg Oral TID WC Johnson, Kelly R, PA-C   420 mg at 02/23/24 1331   folic acid  (FOLVITE ) tablet 1 mg  1 mg Per Tube  Daily Elgergawy, Dawood S, MD   1 mg at 02/23/24 1330   Gerhardt's butt cream   Topical TID Elgergawy, Dawood S, MD   Given at 02/23/24 1332   heparin  injection 5,000 Units  5,000 Units Subcutaneous Q8H Johnson, Kelly R, PA-C   5,000 Units at 02/23/24 1331   hydrocerin (EUCERIN) cream 1 Application  1 Application Topical BID Elgergawy, Dawood S, MD   1 Application at 02/23/24 1332   HYDROcodone -acetaminophen  (NORCO/VICODIN) 5-325 MG per tablet 1-2 tablet  1-2 tablet Oral Q4H PRN Raenelle Donalda HERO, MD   2 tablet at 02/23/24 1330   HYDROmorphone  (DILAUDID ) injection 1-2 mg  1-2 mg Intravenous Q4H PRN Raenelle Donalda HERO, MD       hydrOXYzine  (ATARAX ) tablet 25 mg  25 mg Per Tube Q6H PRN Paytes, Austin A, RPH       hyoscyamine  (LEVSIN  SL) SL tablet 0.125 mg  0.125 mg Sublingual TID Vicci Sor R, PA-C   0.125 mg at  02/23/24 1330   insulin  aspart (novoLOG ) injection 0-6 Units  0-6 Units Subcutaneous Q4H Elgergawy, Dawood S, MD   2 Units at 02/22/24 0946   melatonin tablet 5 mg  5 mg Oral QHS PRN Howerter, Justin B, DO       metroNIDAZOLE  (FLAGYL ) tablet 500 mg  500 mg Oral Q12H Elgergawy, Dawood S, MD   500 mg at 02/23/24 1330   midodrine  (PROAMATINE ) tablet 10 mg  10 mg Per Tube TID Elgergawy, Dawood S, MD   10 mg at 02/23/24 0945   mirtazapine  (REMERON ) tablet 15 mg  15 mg Per Tube QHS Paytes, Austin A, RPH       ondansetron  (ZOFRAN ) injection 4 mg  4 mg Intravenous Q6H PRN Howerter, Justin B, DO   4 mg at 02/23/24 1453   pantoprazole  (PROTONIX ) EC tablet 40 mg  40 mg Oral Daily Vicci Burnard SAUNDERS, PA-C   40 mg at 02/23/24 1330   polyethylene glycol (MIRALAX  / GLYCOLAX ) packet 17 g  17 g Per Tube Daily PRN Paytes, Austin A, RPH       risperiDONE  (RISPERDAL  M-TABS) disintegrating tablet 1 mg  1 mg Oral QHS Elgergawy, Dawood S, MD   1 mg at 02/21/24 2127   sodium chloride  flush (NS) 0.9 % injection 10-40 mL  10-40 mL Intracatheter PRN Elgergawy, Brayton RAMAN, MD       thiamine  (VITAMIN B1) tablet 200 mg   200 mg Per Tube Daily Elgergawy, Dawood S, MD   200 mg at 02/23/24 1330   traZODone  (DESYREL ) tablet 50 mg  50 mg Oral QHS PRN Howerter, Justin B, DO       vancomycin  (VANCOCIN ) IVPB 1000 mg/200 mL premix  1,000 mg Intravenous Q T,Th,Sa-HD Elgergawy, Dawood S, MD   Stopped at 02/23/24 1223     Discharge Medications: Please see discharge summary for a list of discharge medications.  Relevant Imaging Results:  Relevant Lab Results:   Additional Information [id number]. HD pt: TTS at [facility] 12:10pm  Verlon Pischke S Pascal Stiggers, LCSW

## 2024-02-23 NOTE — Evaluation (Signed)
 Occupational Therapy Evaluation Patient Details Name: Deborah [name] MRN: [id number] DOB: 03/29/1965 Today's Date: 02/23/2024   History of Present Illness   Deborah [name] is a 58 y.o. female admitted with AMS on 02/14/24.  Sepsis, SIRS. 12/11 CT: left hip: abscess in anterior upper leg--I & D 12/11.Deborah [name] PHMx: paroxysmal atrial fibrillation, diabetes mellitus type 2, chronic pain, hyperlipidemia, ESRD on HD, R BKA.     Clinical Impressions This 58 yo female admitted with above presents to acute OT with PLOF of reporting she could get up EOB by herself but was hoyer lift for transfer. Pt reports she can usually feed herself post setup and do some of her grooming and UBB, but otherwise staff A her with the rest of ADLs at bed level. Pt currently limited due to nausea and afraid even if she rolls in bed she may get sick. Pt will continue to benefit from acute OT with follow up from continued inpatient follow up therapy, <3 hours/day.      If plan is discharge home, recommend the following:   Two people to help with walking and/or transfers;Two people to help with bathing/dressing/bathroom;Assist for transportation     Functional Status Assessment   Patient has had a recent decline in their functional status and demonstrates the ability to make significant improvements in function in a reasonable and predictable amount of time.     Equipment Recommendations   None recommended by OT      Precautions/Restrictions   Precautions Precautions: Fall Restrictions Weight Bearing Restrictions Per Provider Order: Yes RLE Weight Bearing Per Provider Order: Non weight bearing     Mobility Bed Mobility               General bed mobility comments: Pt reports she is too nauseaous to even attempt rolling or getting to EOB today            ADL either performed or assessed with clinical judgement   ADL Overall ADL's : Needs assistance/impaired Eating/Feeding: Set  up;Bed level   Grooming: Moderate assistance;Bed level   Upper Body Bathing: Moderate assistance;Bed level   Lower Body Bathing: Total assistance;Bed level   Upper Body Dressing : Total assistance;Bed level   Lower Body Dressing: Total assistance;Bed level                       Vision Ability to See in Adequate Light: 0 Adequate              Pertinent Vitals/Pain Pain Assessment Pain Assessment: No/denies pain     Extremity/Trunk Assessment Upper Extremity Assessment Upper Extremity Assessment: Right hand dominant;RUE deficits/detail;LUE deficits/detail RUE Deficits / Details: Decreased AROM and PROM of shoulders, rest grosslly 3/5 RUE Coordination: decreased gross motor LUE Deficits / Details: Decreased AROM and PROM of shoulders, rest grosslly 3/5 LUE Coordination: decreased gross motor           Communication Communication Communication: No apparent difficulties Factors Affecting Communication: Reduced clarity of speech   Cognition Arousal: Alert Behavior During Therapy: WFL for tasks assessed/performed Cognition: No apparent impairments                               Following commands: Intact                  Home Living Family/patient expects to be discharged to:: Skilled nursing facility  Prior Functioning/Environment Prior Level of Function : Needs assist       Physical Assist : Mobility (physical);ADLs (physical) Mobility (physical): Transfers   Mobility Comments: Pt reports normally she can set herself up on EOB by herself, but is hoyed for OOB to wheelchair ADLs Comments: Pt reports the facility staff assist her with everything and she will attempt to help to the best of her ability.    OT Problem List: Decreased strength;Impaired balance (sitting and/or standing);Decreased range of motion;Decreased activity tolerance;Obesity;Impaired UE functional use   OT  Treatment/Interventions: Self-care/ADL training;DME and/or AE instruction;Balance training;Patient/family education      OT Goals(Current goals can be found in the care plan section)   Acute Rehab OT Goals Patient Stated Goal: to not be nauseated OT Goal Formulation: With patient Time For Goal Achievement: 03/15/24 Potential to Achieve Goals: Fair   OT Frequency:  Min 2X/week       AM-PAC OT 6 Clicks Daily Activity     Outcome Measure Help from another person eating meals?: A Little Help from another person taking care of personal grooming?: A Little Help from another person toileting, which includes using toliet, bedpan, or urinal?: Total Help from another person bathing (including washing, rinsing, drying)?: A Lot Help from another person to put on and taking off regular upper body clothing?: Total Help from another person to put on and taking off regular lower body clothing?: Total 6 Click Score: 11   End of Session Nurse Communication:  (Pt reports dizziness and nasuea at rest; BP soft)  Activity Tolerance:  (limited by nausea) Patient left: in bed;with call bell/phone within reach;with bed alarm set;with family/visitor present  OT Visit Diagnosis: Other abnormalities of gait and mobility (R26.89);Muscle weakness (generalized) (M62.81)                Time: 8566-8545 OT Time Calculation (min): 21 min Charges:  OT General Charges $OT Visit: 1 Visit OT Evaluation $OT Eval Low Complexity: 1 Low  Donny BECKER OT Acute Rehabilitation Services Office [phone]    Deborah [name] 02/23/2024, 4:06 PM

## 2024-02-23 NOTE — Progress Notes (Signed)
 2015 This RN was called to room by tech as patient began becoming sick while being repositioned by tech and another RN. Upon entering room, pt was vomiting and coretrak was coiled inside mouth. Coretrak removed and TF stopped. MD notified.   2030 patient still nauseous. IV zofran  given, see eMAR. No signs of distress.  2100 MD notified we do not have coretrak team on for nights and weekends.   2130 Patient refusing to take all nighttime meds. MD notified. No signs of distress.  2215 Patient sleeping, awoke to RN question of allowing heparin  shot. Patient agreeable to heparin  and slept through administration. No sign of distress.   9976 tech woke patient for vitals and sugar check, pt asked for iv dilaudid .   [phone] RN entered and patient was sleeping, no signs of distress.   0115 patient called out asking for iv dilaudid . RN offered po dilaudid  and patient stated will you have to mix it in the water. RN informed her she can swallow the pill whole which patient responded I can't it gets stuck in my throat. RN offered to crush and give with applesauce or pudding. Patient then stated I will have to call my brother in the morning this is ridiculous. RN offered again to give po in applesauce/pudding/patient preference and patient declined po dilaudid . Patient resting, no signs of distress.

## 2024-02-23 NOTE — Progress Notes (Signed)
 Received patient in bed.Alert and oriented x 4.Consent for treatment verified.  Access used : Right hd catheter that worked well. Dressing on date.  Duration of treatment: 3.5 liters.  Uf goal : 1.8 liters.  Medicines given:  Midodrine  10 mg.                              Vancomycin  1g.                               Ancef  2 g   Hand off to the patient's nurse with stable vitals via transport.

## 2024-02-23 NOTE — Progress Notes (Signed)
 Pt woke up from sleep stating she needs to be cleaned up as she had a bowel movement and asked for RN to bring in pain medication. RN brought in po dilaudid  and patient upset again. Education provided again. Patient finally agreed to take po dilaudid , whole with pudding without issues, and asked to be placed on bedpan. Patient placed on bedpan and told staff to leave and she will call when finished and ready to be cleaned up. Call bell within reach.

## 2024-02-23 NOTE — Progress Notes (Signed)
 PROGRESS NOTE        PATIENT DETAILS Name: Deborah [name] Age: 58 y.o. Sex: female Date of Birth: Jun 05, 1965 Admit Date: 02/14/2024 Admitting Physician Elgin DELENA Lam, MD PCP:Pcp, No  Brief Summary: Patient is a 58 y.o.  female with history of ESRD on HD, PAF, HLD, right BKA who presented to the hospital with altered mental status-she was found to have severe sepsis secondary to left thigh abscess-evaluated by general surgery underwent I&D on 12/11.  Significant events: 12/9>> admit to [facility]  Significant studies: 12/9>> CXR: No PNA 12/9>> CT head: No acute intracranial abnormality 12/11>> CT abdomen/pelvis: Fluid collection-left thigh 3.3 x 15.5 cm 12/11>> CT left hip: Abscess in the left anterior upper leg 12/12>> MRI brain: No acute intracranial abnormality 12/12>> Spot EEG: No seizures  Significant microbiology data: 12/9>> COVID/influenza/RSV PCR: Negative 12/9>> blood cultures: No growth 12/11>> left thigh abscess: Proteus/Enterococcus avium/Prevotella 12/11>> blood culture: No growth  Procedures: 12/11>> I&D left thigh abscess  Consults: General surgery PCCM Nephrology  Subjective: Had over left thigh pain overnight.  NG tube dislodged last night  Objective: Vitals: Blood pressure (!) 106/55, pulse (!) 103, temperature (!) 97.3 F (36.3 C), temperature source Oral, resp. rate (!) 22, height 5' 6 (1.676 m), weight 98.5 kg, last menstrual period 10/10/2012, SpO2 (!) 59%.   Exam: Awake/alert Chest: Clear to auscultation CVS: S1-S2 regular Abdomen: Soft nontender nondistended Extremities: Right BKA Nonfocal exam   Pertinent Labs/Radiology:    Latest Ref Rng & Units 02/23/2024    8:40 AM 02/22/2024    5:00 AM 02/21/2024    4:16 AM  CBC  WBC 4.0 - 10.5 K/uL 21.5  18.9  20.5   Hemoglobin 12.0 - 15.0 g/dL 8.0  7.8  8.4   Hematocrit 36.0 - 46.0 % 25.5  25.2  26.8   Platelets 150 - 400 K/uL 254  232  243     Lab Results   Component Value Date   NA 133 (L) 02/23/2024   K 4.8 02/23/2024   CL 100 02/23/2024   CO2 27 02/23/2024      Assessment/Plan: Severe sepsis secondary to left thigh abscess Sepsis physiology has improved with worsening pain in the left thigh area with worsening leukocytosis today Although exam remains remains unchanged-Will  repeat CT imaging to ensure she does not have any residual abscess/cultures. Has central line in place-all antibiotics can be given with dialysis-suspect IV line can be discontinued.  Acute metabolic encephalopathy Secondary to sepsis physiology Neuroimaging/EEG as above Clinically improved-back to baseline  Oropharyngeal dysphagia Resolved-NG tube dislodged last night-since able to eat-encourage oral intake.  No plans on replacing NG tube.   ESRD on HD TTS Nephrology following  Normocytic anemia Related to ESRD Did receive 1 unit of PRBC 12/12 Follow Hb-and transfuse accordingly  Chronic hypotension Continue midodrine -BP soft but stable  DM-2 (A1c 4.3 on 12/10) CBG stable SSI   Recent Labs    02/23/24 0023 02/23/24 0358 02/23/24 0745  GLUCAP 135* 127* 134*     HLD Statin  PAF Telemetry monitoring Not on anticoagulation due to prior history of GI bleeding  Gout Allopurinol   Depression/anxiety Psych meds initially held due to encephalopathy-since clinically improved-risperidone /Remeron /as needed Atarax  has been resumed-which she seems to be tolerating well. Monitor/follow  Subserosal fibroid extending along left adnexa Incidental finding on CT imaging Stable for outpatient follow-up  Left inguinal lymphadenopathy Likely reactive Stable for outpatient follow-up  Nutrition Status: Nutrition Problem: Inadequate oral intake Etiology: inability to eat Signs/Symptoms: NPO status Interventions: Tube feeding, Refer to RD note for recommendations  Pressure Ulcer: Agree with assessment as outlined below Wound 02/19/24 0200  Pressure Injury Foot Left;Lateral Unstageable - Full thickness tissue loss in which the base of the injury is covered by slough (yellow, tan, gray, green or brown) and/or eschar (tan, brown or black) in the wound bed. (Active)   Class 1 Obesity: Estimated body mass index is 35.05 kg/m as calculated from the following:   Height as of this encounter: 5' 6 (1.676 m).   Weight as of this encounter: 98.5 kg.   Code status:   Code Status: Full Code   DVT Prophylaxis: heparin  injection 5,000 Units Start: 02/14/24 2315   Family Communication: None at bedside   Disposition Plan: Status is: Inpatient Remains inpatient appropriate because: Severity of illness   Planned Discharge Destination:Skilled nursing facility   Diet: Diet Order             Diet Carb Modified Room service appropriate? Yes  Diet effective now                     Antimicrobial agents: Anti-infectives (From admission, onward)    Start     Dose/Rate Route Frequency Ordered Stop   02/21/24 1200  vancomycin  (VANCOCIN ) IVPB 1000 mg/200 mL premix       Placed in Followed by Linked Group   1,000 mg 200 mL/hr over 60 Minutes Intravenous Every T-Th-Sa (Hemodialysis) 02/20/24 1458     02/21/24 1200  ceFAZolin  (ANCEF ) IVPB 2g/100 mL premix       Placed in Followed by Linked Group   2 g 200 mL/hr over 30 Minutes Intravenous Every T-Th-Sa (Hemodialysis) 02/20/24 1458     02/20/24 2200  metroNIDAZOLE  (FLAGYL ) tablet 500 mg        500 mg Oral Every 12 hours 02/20/24 1458     02/20/24 1545  vancomycin  (VANCOREADY) IVPB 2000 mg/400 mL       Placed in Followed by Linked Group   2,000 mg 200 mL/hr over 120 Minutes Intravenous  Once 02/20/24 1458 02/20/24 1818   02/20/24 1545  ceFAZolin  (ANCEF ) IVPB 2g/100 mL premix       Placed in Followed by Linked Group   2 g 200 mL/hr over 30 Minutes Intravenous  Once 02/20/24 1458 02/20/24 1621   02/18/24 1800  meropenem  (MERREM ) 500 mg in sodium chloride  0.9 % 100 mL  IVPB  Status:  Discontinued        500 mg 200 mL/hr over 30 Minutes Intravenous Every 24 hours 02/17/24 0941 02/20/24 1458   02/17/24 1030  meropenem  (MERREM ) 500 mg in sodium chloride  0.9 % 100 mL IVPB        500 mg 200 mL/hr over 30 Minutes Intravenous  Once 02/17/24 0941 02/17/24 1059   02/17/24 1000  meropenem  (MERREM ) 500 mg in sodium chloride  0.9 % 100 mL IVPB  Status:  Discontinued        500 mg 200 mL/hr over 30 Minutes Intravenous Every 24 hours 02/16/24 1045 02/17/24 0941   02/16/24 1145  linezolid  (ZYVOX ) IVPB 600 mg  Status:  Discontinued        600 mg 300 mL/hr over 60 Minutes Intravenous Every 12 hours 02/16/24 1045 02/20/24 1458   02/16/24 1145  meropenem  (MERREM ) 500 mg in sodium chloride  0.9 % 100 mL  IVPB  Status:  Discontinued        500 mg 200 mL/hr over 30 Minutes Intravenous  Once 02/16/24 1045 02/17/24 0941   02/15/24 2000  ceFEPIme  (MAXIPIME ) 1 g in sodium chloride  0.9 % 100 mL IVPB  Status:  Discontinued        1 g 200 mL/hr over 30 Minutes Intravenous Every 24 hours 02/14/24 1649 02/16/24 1045   02/15/24 1700  vancomycin  (VANCOCIN ) IVPB 1000 mg/200 mL premix        1,000 mg 200 mL/hr over 60 Minutes Intravenous  Once 02/15/24 1528 02/15/24 2015   02/15/24 1415  vancomycin  variable dose per unstable renal function (pharmacist dosing)  Status:  Discontinued         Does not apply See admin instructions 02/15/24 1415 02/16/24 1045   02/15/24 0600  metroNIDAZOLE  (FLAGYL ) IVPB 500 mg  Status:  Discontinued        500 mg 100 mL/hr over 60 Minutes Intravenous Every 12 hours 02/14/24 2305 02/16/24 1046   02/14/24 1445  aztreonam  (AZACTAM ) 2 g in sodium chloride  0.9 % 100 mL IVPB  Status:  Discontinued        2 g 200 mL/hr over 30 Minutes Intravenous  Once 02/14/24 1435 02/14/24 1440   02/14/24 1445  metroNIDAZOLE  (FLAGYL ) IVPB 500 mg        500 mg 100 mL/hr over 60 Minutes Intravenous  Once 02/14/24 1435 02/14/24 1844   02/14/24 1445  vancomycin  (VANCOCIN ) IVPB 1000  mg/200 mL premix  Status:  Discontinued        1,000 mg 200 mL/hr over 60 Minutes Intravenous  Once 02/14/24 1435 02/14/24 1444   02/14/24 1445  ceFEPIme  (MAXIPIME ) 2 g in sodium chloride  0.9 % 100 mL IVPB        2 g 200 mL/hr over 30 Minutes Intravenous  Once 02/14/24 1444 02/14/24 1606   02/14/24 1445  vancomycin  (VANCOREADY) IVPB 2000 mg/400 mL        2,000 mg 200 mL/hr over 120 Minutes Intravenous  Once 02/14/24 1444 02/14/24 1848        MEDICATIONS: Scheduled Meds:  allopurinol   100 mg Per Tube BID   atorvastatin   10 mg Per Tube QPM   cholestyramine  light  4 g Per Tube BID   famotidine   20 mg Per Tube QAC breakfast   feeding supplement (OSMOLITE 1.5 CAL)  960 mL Per Tube Q24H   ferric citrate   420 mg Oral TID WC   folic acid   1 mg Per Tube Daily   Gerhardt's butt cream   Topical TID   heparin   5,000 Units Subcutaneous Q8H   hydrocerin  1 Application Topical BID   hyoscyamine   0.125 mg Sublingual TID   insulin  aspart  0-6 Units Subcutaneous Q4H   metroNIDAZOLE   500 mg Oral Q12H   midodrine   10 mg Per Tube TID   mirtazapine   15 mg Per Tube QHS   pantoprazole   40 mg Oral Daily   risperiDONE   1 mg Oral QHS   thiamine   200 mg Per Tube Daily   Continuous Infusions:   ceFAZolin  (ANCEF ) IV 2 g (02/23/24 1224)   vancomycin  Stopped (02/23/24 1223)   PRN Meds:.acetaminophen , albuterol , alteplase , heparin , HYDROcodone -acetaminophen , HYDROmorphone  (DILAUDID ) injection, HYDROmorphone , hydrOXYzine , lidocaine  (PF), lidocaine -prilocaine , loperamide , melatonin, ondansetron  (ZOFRAN ) IV, pentafluoroprop-tetrafluoroeth, polyethylene glycol, sodium chloride  flush, traZODone    I have personally reviewed following labs and imaging studies  LABORATORY DATA: CBC: Recent Labs  Lab 02/19/24 0316 02/20/24 0147 02/21/24 0416  02/22/24 0500 02/23/24 0840  WBC 18.5* 19.9* 20.5* 18.9* 21.5*  NEUTROABS  --   --   --  13.9*  --   HGB 7.5* 7.9* 8.4* 7.8* 8.0*  HCT 23.2* 24.9* 26.8* 25.2*  25.5*  MCV 86.2 87.4 89.6 90.3 92.4  PLT 182 212 243 232 254    Basic Metabolic Panel: Recent Labs  Lab 02/17/24 0257 02/17/24 1415 02/19/24 1813 02/20/24 0147 02/21/24 0416 02/22/24 0500 02/23/24 0840  NA 135   < > 136 134* 136 132* 133*  K 3.8   < > 3.7 3.7 4.3 4.6 4.8  CL 99   < > 102 99 101 98 100  CO2 27   < > 26 26 20* 26 27  GLUCOSE 103*   < > 195* 201* 200* 230* 157*  BUN 18   < > 8 9 16 13 20   CREATININE 4.76*   < > 2.55* 2.83* 3.88* 2.70* 3.82*  CALCIUM  7.9*   < > 7.4* 7.1* 8.0* 7.9* 8.4*  MG 1.6*  --  1.9  --   --   --   --   PHOS 2.3*   < > 2.5 2.8 2.9 2.1* 3.3   < > = values in this interval not displayed.    GFR: Estimated Creatinine Clearance: 18.8 mL/min (A) (by C-G formula based on SCr of 3.82 mg/dL (H)).  Liver Function Tests: Recent Labs  Lab 02/19/24 1813 02/20/24 0147 02/21/24 0416 02/22/24 0500 02/23/24 0840  ALBUMIN  1.7* 1.6* 1.7* 2.2* 2.3*   No results for input(s): LIPASE, AMYLASE in the last 168 hours. Recent Labs  Lab 02/17/24 1300  AMMONIA 18    Coagulation Profile: No results for input(s): INR, PROTIME in the last 168 hours.  Cardiac Enzymes: No results for input(s): CKTOTAL, CKMB, CKMBINDEX, TROPONINI in the last 168 hours.  BNP (last 3 results) No results for input(s): PROBNP in the last 8760 hours.  Lipid Profile: No results for input(s): CHOL, HDL, LDLCALC, TRIG, CHOLHDL, LDLDIRECT in the last 72 hours.  Thyroid  Function Tests: No results for input(s): TSH, T4TOTAL, FREET4, T3FREE, THYROIDAB in the last 72 hours.  Anemia Panel: No results for input(s): VITAMINB12, FOLATE, FERRITIN, TIBC, IRON , RETICCTPCT in the last 72 hours.  Urine analysis:    Component Value Date/Time   COLORURINE STRAW (A) 06/19/2022 0638   APPEARANCEUR HAZY (A) 06/19/2022 0638   LABSPEC 1.010 06/19/2022 0638   PHURINE 5.0 06/19/2022 0638   GLUCOSEU NEGATIVE 06/19/2022 0638   HGBUR SMALL  (A) 06/19/2022 0638   BILIRUBINUR NEGATIVE 06/19/2022 0638   KETONESUR NEGATIVE 06/19/2022 0638   PROTEINUR 30 (A) 06/19/2022 0638   UROBILINOGEN 0.2 10/02/2013 2108   NITRITE NEGATIVE 06/19/2022 0638   LEUKOCYTESUR LARGE (A) 06/19/2022 0638    Sepsis Labs: Lactic Acid, Venous    Component Value Date/Time   LATICACIDVEN 0.7 02/17/2024 1218    MICROBIOLOGY: Recent Results (from the past 240 hours)  Culture, blood (single)     Status: None   Collection Time: 02/14/24  1:10 PM   Specimen: BLOOD RIGHT FOREARM  Result Value Ref Range Status   Specimen Description BLOOD RIGHT FOREARM  Final   Special Requests   Final    BOTTLES DRAWN AEROBIC AND ANAEROBIC Blood Culture results may not be optimal due to an inadequate volume of blood received in culture bottles   Culture   Final    NO GROWTH 5 DAYS Performed at [facility], [address], [place], [place]  [address]    Report Status 02/19/2024 FINAL  Final  Resp panel by RT-PCR (RSV, Flu A&B, Covid) Anterior Nasal Swab     Status: None   Collection Time: 02/14/24  3:23 PM   Specimen: Anterior Nasal Swab  Result Value Ref Range Status   SARS Coronavirus 2 by RT PCR NEGATIVE NEGATIVE Final   Influenza A by PCR NEGATIVE NEGATIVE Final   Influenza B by PCR NEGATIVE NEGATIVE Final    Comment: (NOTE) The Xpert Xpress SARS-CoV-2/FLU/RSV plus assay is intended as an aid in the diagnosis of influenza from Nasopharyngeal swab specimens and should not be used as a sole basis for treatment. Nasal washings and aspirates are unacceptable for Xpert Xpress SARS-CoV-2/FLU/RSV testing.  Fact Sheet for Patients: bloggercourse.com  Fact Sheet for Healthcare Providers: seriousbroker.it  This test is not yet approved or cleared by the [place]  FDA and has been authorized for detection and/or diagnosis of SARS-CoV-2 by FDA under an Emergency Use Authorization (EUA). This EUA will  remain in effect (meaning this test can be used) for the duration of the COVID-19 declaration under Section 564(b)(1) of the Act, 21 U.S.C. section 360bbb-3(b)(1), unless the authorization is terminated or revoked.     Resp Syncytial Virus by PCR NEGATIVE NEGATIVE Final    Comment: (NOTE) Fact Sheet for Patients: bloggercourse.com  Fact Sheet for Healthcare Providers: seriousbroker.it  This test is not yet approved or cleared by the [place]  FDA and has been authorized for detection and/or diagnosis of SARS-CoV-2 by FDA under an Emergency Use Authorization (EUA). This EUA will remain in effect (meaning this test can be used) for the duration of the COVID-19 declaration under Section 564(b)(1) of the Act, 21 U.S.C. section 360bbb-3(b)(1), unless the authorization is terminated or revoked.  Performed at [facility], [address] [place], [place] [address]   Culture, blood (Routine X 2) w Reflex to ID Panel     Status: None   Collection Time: 02/16/24  4:12 AM   Specimen: BLOOD RIGHT HAND  Result Value Ref Range Status   Specimen Description BLOOD RIGHT HAND  Final   Special Requests   Final    BOTTLES DRAWN AEROBIC ONLY Blood Culture results may not be optimal due to an inadequate volume of blood received in culture bottles   Culture   Final    NO GROWTH 5 DAYS Performed at [facility], [address] [place], [place] [address]    Report Status 02/21/2024 FINAL  Final  Culture, blood (Routine X 2) w Reflex to ID Panel     Status: None   Collection Time: 02/16/24  4:14 AM   Specimen: BLOOD RIGHT HAND  Result Value Ref Range Status   Specimen Description BLOOD RIGHT HAND  Final   Special Requests   Final    BOTTLES DRAWN AEROBIC ONLY Blood Culture results may not be optimal due to an inadequate volume of blood received in culture bottles   Culture   Final    NO GROWTH 5 DAYS Performed at [facility], [address], [place], [place] [address]    Report Status 02/21/2024 FINAL  Final  Aerobic/Anaerobic Culture w Gram Stain (surgical/deep wound)     Status: None   Collection Time: 02/16/24  2:30 PM   Specimen: Path fluid; Body Fluid  Result Value Ref Range Status   Specimen Description FLUID  Final   Special Requests LEFT THIGH ABSCESS  Final   Gram Stain  Final    ABUNDANT WBC PRESENT, PREDOMINANTLY PMN RARE GRAM NEGATIVE RODS FEW GRAM POSITIVE RODS RARE GRAM POSITIVE COCCI    Culture   Final    MODERATE PROTEUS MIRABILIS FEW ENTEROCOCCUS AVIUM FEW PREVOTELLA BUCCAE BETA LACTAMASE POSITIVE Performed at [facility], [address] [place], [place] [address]    Report Status 02/20/2024 FINAL  Final   Organism ID, Bacteria PROTEUS MIRABILIS  Final   Organism ID, Bacteria ENTEROCOCCUS AVIUM  Final      Susceptibility   Enterococcus avium - MIC*    AMPICILLIN <=2 SENSITIVE Sensitive     VANCOMYCIN  <=0.5 SENSITIVE Sensitive     GENTAMICIN SYNERGY SENSITIVE Sensitive     * FEW ENTEROCOCCUS AVIUM   Proteus mirabilis - MIC*    AMPICILLIN <=2 SENSITIVE Sensitive     CEFAZOLIN  (NON-URINE) 2 SENSITIVE Sensitive     CEFEPIME  <=0.12 SENSITIVE Sensitive     ERTAPENEM <=0.12 SENSITIVE Sensitive     CEFTRIAXONE  <=0.25 SENSITIVE Sensitive     CIPROFLOXACIN  <=0.06 SENSITIVE Sensitive     GENTAMICIN <=1 SENSITIVE Sensitive     MEROPENEM  0.5 SENSITIVE Sensitive     TRIMETH/SULFA <=20 SENSITIVE Sensitive     AMPICILLIN/SULBACTAM <=2 SENSITIVE Sensitive     PIP/TAZO Value in next row Sensitive      <=4 SENSITIVEThis is a modified FDA-approved test that has been validated and its performance characteristics determined by the reporting laboratory.  This laboratory is certified under the Clinical Laboratory Improvement Amendments CLIA as qualified to perform high complexity clinical laboratory testing.    * MODERATE PROTEUS MIRABILIS    RADIOLOGY STUDIES/RESULTS: No results  found.   LOS: 9 days   Donalda Applebaum, MD  [facility]    To contact the attending provider between 7A-7P or the covering provider during after hours 7P-7A, please log into the web site [email or web address] and access using universal [place] password for that web site. If you do not have the password, please call the hospital operator.  02/23/2024, 12:27 PM

## 2024-02-24 ENCOUNTER — Inpatient Hospital Stay (HOSPITAL_COMMUNITY)

## 2024-02-24 DIAGNOSIS — G9340 Encephalopathy, unspecified: Secondary | ICD-10-CM | POA: Diagnosis not present

## 2024-02-24 DIAGNOSIS — N186 End stage renal disease: Secondary | ICD-10-CM | POA: Diagnosis not present

## 2024-02-24 DIAGNOSIS — Z992 Dependence on renal dialysis: Secondary | ICD-10-CM | POA: Diagnosis not present

## 2024-02-24 DIAGNOSIS — I953 Hypotension of hemodialysis: Secondary | ICD-10-CM | POA: Diagnosis not present

## 2024-02-24 DIAGNOSIS — R6520 Severe sepsis without septic shock: Secondary | ICD-10-CM | POA: Diagnosis not present

## 2024-02-24 DIAGNOSIS — A419 Sepsis, unspecified organism: Secondary | ICD-10-CM | POA: Diagnosis not present

## 2024-02-24 LAB — CBC WITH DIFFERENTIAL/PLATELET
Abs Immature Granulocytes: 0.17 K/uL — ABNORMAL HIGH (ref 0.00–0.07)
Basophils Absolute: 0.1 K/uL (ref 0.0–0.1)
Basophils Relative: 1 %
Eosinophils Absolute: 0.2 K/uL (ref 0.0–0.5)
Eosinophils Relative: 1 %
HCT: 23.2 % — ABNORMAL LOW (ref 36.0–46.0)
Hemoglobin: 7.2 g/dL — ABNORMAL LOW (ref 12.0–15.0)
Immature Granulocytes: 1 %
Lymphocytes Relative: 20 %
Lymphs Abs: 3.5 K/uL (ref 0.7–4.0)
MCH: 28.8 pg (ref 26.0–34.0)
MCHC: 31 g/dL (ref 30.0–36.0)
MCV: 92.8 fL (ref 80.0–100.0)
Monocytes Absolute: 1.2 K/uL — ABNORMAL HIGH (ref 0.1–1.0)
Monocytes Relative: 7 %
Neutro Abs: 12.8 K/uL — ABNORMAL HIGH (ref 1.7–7.7)
Neutrophils Relative %: 70 %
Platelets: 255 K/uL (ref 150–400)
RBC: 2.5 MIL/uL — ABNORMAL LOW (ref 3.87–5.11)
RDW: 16.5 % — ABNORMAL HIGH (ref 11.5–15.5)
WBC: 18 K/uL — ABNORMAL HIGH (ref 4.0–10.5)
nRBC: 0 % (ref 0.0–0.2)

## 2024-02-24 LAB — TYPE AND SCREEN
ABO/RH(D): O POS
Antibody Screen: NEGATIVE
Donor AG Type: NEGATIVE
Donor AG Type: NEGATIVE
Unit division: 0
Unit division: 0

## 2024-02-24 LAB — BPAM RBC
Blood Product Expiration Date: 202601122359
Blood Product Expiration Date: 202601122359
Unit Type and Rh: 5100
Unit Type and Rh: 5100

## 2024-02-24 LAB — GLUCOSE, CAPILLARY
Glucose-Capillary: 80 mg/dL (ref 70–99)
Glucose-Capillary: 89 mg/dL (ref 70–99)
Glucose-Capillary: 87 mg/dL (ref 70–99)
Glucose-Capillary: 92 mg/dL (ref 70–99)
Glucose-Capillary: 94 mg/dL (ref 70–99)

## 2024-02-24 MED ORDER — LOPERAMIDE HCL 2 MG PO CAPS
2.0000 mg | ORAL_CAPSULE | ORAL | Status: DC | PRN
  Administered 2024-02-25: 2 mg via ORAL
  Filled 2024-02-24 (×2): qty 1

## 2024-02-24 MED ORDER — DARBEPOETIN ALFA 200 MCG/0.4ML IJ SOSY
200.0000 ug | PREFILLED_SYRINGE | INTRAMUSCULAR | Status: DC
  Administered 2024-02-25: 200 ug via SUBCUTANEOUS
  Filled 2024-02-24: qty 0.4

## 2024-02-24 NOTE — Plan of Care (Signed)
" °  Problem: Education: Goal: Ability to describe self-care measures that may prevent or decrease complications (Diabetes Survival Skills Education) will improve Outcome: Not Progressing Goal: Individualized Educational Video(s) Outcome: Not Progressing   Problem: Coping: Goal: Ability to adjust to condition or change in health will improve Outcome: Not Progressing   Problem: Health Behavior/Discharge Planning: Goal: Ability to identify and utilize available resources and services will improve Outcome: Not Progressing   Problem: Metabolic: Goal: Ability to maintain appropriate glucose levels will improve Outcome: Not Progressing   Problem: Nutritional: Goal: Progress toward achieving an optimal weight will improve Outcome: Not Progressing   Problem: Skin Integrity: Goal: Risk for impaired skin integrity will decrease Outcome: Not Progressing   Problem: Tissue Perfusion: Goal: Adequacy of tissue perfusion will improve Outcome: Not Progressing   Problem: Clinical Measurements: Goal: Signs and symptoms of infection will decrease Outcome: Not Progressing   Problem: Respiratory: Goal: Ability to maintain adequate ventilation will improve Outcome: Not Progressing   Problem: Health Behavior/Discharge Planning: Goal: Ability to manage health-related needs will improve Outcome: Not Progressing   "

## 2024-02-24 NOTE — Progress Notes (Signed)
 " [facility] Progress Note   Subjective:   Seen in room. Answering questions appropriately. No CP/dyspnea. Some leg pain. For HD tomorrow.  Objective Vitals:   02/23/24 2348 02/24/24 0441 02/24/24 0500 02/24/24 0700  BP: (!) 92/54 (!) 102/52  (!) 106/57  Pulse:      Resp:      Temp: 99.1 F (37.3 C) 98.9 F (37.2 C)  98.2 F (36.8 C)  TempSrc: Oral Oral  Oral  SpO2:      Weight:   96.1 kg   Height:       Physical Exam General: Chronically ill appearing, NAD. Room air Heart: RRR; no murmur Lungs: CTA anteriorly Abdomen: soft Extremities: no LE edema Dialysis Access:  TDC + LUE AVF +t/b  Additional Objective Labs: Basic Metabolic Panel: Recent Labs  Lab 02/21/24 0416 02/22/24 0500 02/23/24 0840  NA 136 132* 133*  K 4.3 4.6 4.8  CL 101 98 100  CO2 20* 26 27  GLUCOSE 200* 230* 157*  BUN 16 13 20   CREATININE 3.88* 2.70* 3.82*  CALCIUM  8.0* 7.9* 8.4*  PHOS 2.9 2.1* 3.3   Liver Function Tests: Recent Labs  Lab 02/21/24 0416 02/22/24 0500 02/23/24 0840  ALBUMIN  1.7* 2.2* 2.3*   CBC: Recent Labs  Lab 02/20/24 0147 02/21/24 0416 02/22/24 0500 02/23/24 0840 02/24/24 0800  WBC 19.9* 20.5* 18.9* 21.5* 18.0*  NEUTROABS  --   --  13.9*  --  12.8*  HGB 7.9* 8.4* 7.8* 8.0* 7.2*  HCT 24.9* 26.8* 25.2* 25.5* 23.2*  MCV 87.4 89.6 90.3 92.4 92.8  PLT 212 243 232 254 255   Blood Culture    Component Value Date/Time   SDES FLUID 02/16/2024 1430   SPECREQUEST LEFT THIGH ABSCESS 02/16/2024 1430   CULT  02/16/2024 1430    MODERATE PROTEUS MIRABILIS FEW ENTEROCOCCUS AVIUM FEW PREVOTELLA BUCCAE BETA LACTAMASE POSITIVE Performed at [facility], [address] [place], [place] [address]    REPTSTATUS 02/20/2024 FINAL 02/16/2024 1430   Medications:   ceFAZolin  (ANCEF ) IV Stopped (02/23/24 1254)   vancomycin  Stopped (02/23/24 1223)    allopurinol   100 mg Per Tube BID   atorvastatin   10 mg Per Tube QPM   cholestyramine  light  4 g Per  Tube BID   famotidine   20 mg Per Tube QAC breakfast   feeding supplement (OSMOLITE 1.5 CAL)  960 mL Per Tube Q24H   ferric citrate   420 mg Oral TID WC   folic acid   1 mg Per Tube Daily   Gerhardt's butt cream   Topical TID   heparin   5,000 Units Subcutaneous Q8H   hydrocerin  1 Application Topical BID   hyoscyamine   0.125 mg Sublingual TID   insulin  aspart  0-6 Units Subcutaneous Q4H   metroNIDAZOLE   500 mg Oral Q12H   midodrine   10 mg Per Tube TID   mirtazapine   15 mg Per Tube QHS   pantoprazole   40 mg Oral Daily   risperiDONE   1 mg Oral QHS    Dialysis Orders TTS - South 4hr, 400/800, EDW 96.1kg, 3K/2.5Ca bath, TDC, no heparin  - Mircera 225mcg IV q 2 weeks (last given 12/6) - Hectoral 2mcg IV q HD  Assessment/Plan: Sepsis/L thigh abscess: S/p I&D 12/11. Finishing course of IV abx. Leukocytosis: Plan was CT chest - she declined. AMS: Due to #1, improved.  ESRD: Continue HD on TTS schedule - next tomorrow. Does have a LUE AVF, she refuses use, therefore use TDC. HypoTN/volume: BP  soft at baseline, on mido 10mg  TID.  Anemia of ESRD: Hgb 7.2 - resume ESA tomorrow. S/p 1U PRBCs on 12/12 Secondary HPTH: Ca/Phos ok - monitor. NG tube out - tolerating PO per notes. Nutrition: Alb low but improving. T2DM  FYI -- Holiday Schedule for next week Usual MWF schedule -> Sun (21), Tues (23), Friday (26) Usual TTS schedule -> Mon (22), Wed (24), Sat (27)    Katie Eleonora Peeler, PA-C 02/24/2024, 10:57 AM  [facility]    "

## 2024-02-24 NOTE — Plan of Care (Signed)
" °  Problem: Health Behavior/Discharge Planning: Goal: Ability to manage health-related needs will improve Outcome: Progressing   Problem: Nutritional: Goal: Maintenance of adequate nutrition will improve Outcome: Progressing Goal: Progress toward achieving an optimal weight will improve Outcome: Progressing   Problem: Skin Integrity: Goal: Risk for impaired skin integrity will decrease Outcome: Progressing   Problem: Tissue Perfusion: Goal: Adequacy of tissue perfusion will improve Outcome: Progressing   Problem: Clinical Measurements: Goal: Diagnostic test results will improve Outcome: Progressing Goal: Signs and symptoms of infection will decrease Outcome: Progressing   "

## 2024-02-24 NOTE — Progress Notes (Signed)
 "                        PROGRESS NOTE        PATIENT DETAILS Name: Deborah [name] Age: 58 y.o. Sex: female Date of Birth: 07/28/1965 Admit Date: 02/14/2024 Admitting Physician Elgin DELENA Lam, MD PCP:Pcp, No  Brief Summary: Patient is a 57 y.o.  female with history of ESRD on HD, PAF, HLD, right BKA who presented to the hospital with altered mental status-she was found to have severe sepsis secondary to left thigh abscess-evaluated by general surgery underwent I&D on 12/11.  Significant events: 12/9>> admit to [facility]  Significant studies: 12/9>> CXR: No PNA 12/9>> CT head: No acute intracranial abnormality 12/11>> CT abdomen/pelvis: Fluid collection-left thigh 3.3 x 15.5 cm 12/11>> CT left hip: Abscess in the left anterior upper leg 12/12>> MRI brain: No acute intracranial abnormality 12/12>> Spot EEG: No seizures  Significant microbiology data: 12/9>> COVID/influenza/RSV PCR: Negative 12/9>> blood cultures: No growth 12/11>> left thigh abscess: Proteus/Enterococcus avium/Prevotella 12/11>> blood culture: No growth  Procedures: 12/11>> I&D left thigh abscess  Consults: General surgery PCCM Nephrology  Subjective: No issues overnight-refused to get CT scan yesterday-continues to have pain in the left thigh area.  Objective: Vitals: Blood pressure (!) 106/57, pulse 99, temperature 98.2 F (36.8 C), temperature source Oral, resp. rate 18, height 5' 6 (1.676 m), weight 96.1 kg, last menstrual period 10/10/2012, SpO2 100%.   Exam: Awake/alert Chest: Clear to auscultation CVS: S1-S2 regular Abdomen: Soft nontender nondistended Extremities: Right BKA Nonfocal exam   Pertinent Labs/Radiology:    Latest Ref Rng & Units 02/24/2024    8:00 AM 02/23/2024    8:40 AM 02/22/2024    5:00 AM  CBC  WBC 4.0 - 10.5 K/uL 18.0  21.5  18.9   Hemoglobin 12.0 - 15.0 g/dL 7.2  8.0  7.8   Hematocrit 36.0 - 46.0 % 23.2  25.5  25.2   Platelets 150 - 400 K/uL 255  254  232      Lab Results  Component Value Date   NA 133 (L) 02/23/2024   K 4.8 02/23/2024   CL 100 02/23/2024   CO2 27 02/23/2024      Assessment/Plan: Severe sepsis secondary to left thigh abscess Continues to persistent leukocytosis-refused to go to CT scan yesterday-awaiting repeat CT chest but exam does not appear any different however she continues to complain of worsening pain Continue IV antibiotics Await CT Try to remove central line yesterday but she has no other IV access-hence we will keep for now.  Acute metabolic encephalopathy Secondary to sepsis physiology Neuroimaging/EEG as above Clinically improved-back to baseline  Oropharyngeal dysphagia Improved-NG tube dislodged 12/18-tolerating regular diet.  ESRD on HD TTS Nephrology following  Normocytic anemia Related to ESRD Did receive 1 unit of PRBC 12/12 Follow Hb-and transfuse accordingly  Chronic hypotension Continue midodrine -BP soft but stable  DM-2 (A1c 4.3 on 12/10) CBG stable SSI   Recent Labs    02/23/24 2350 02/24/24 0443 02/24/24 0726  GLUCAP 121* 80 89     HLD Statin  PAF Telemetry monitoring Not on anticoagulation due to prior history of GI bleeding  Gout Allopurinol   Depression/anxiety Psych meds initially held due to encephalopathy-since clinically improved-risperidone /Remeron /as needed Atarax  has been resumed-which she seems to be tolerating well. Monitor/follow  Subserosal fibroid extending along left adnexa Incidental finding on CT imaging Stable for outpatient follow-up  Left inguinal lymphadenopathy Likely reactive Stable for outpatient follow-up  Nutrition Status: Nutrition Problem: Inadequate oral intake Etiology: inability to eat Signs/Symptoms: NPO status Interventions: Tube feeding, Refer to RD note for recommendations  Pressure Ulcer: Agree with assessment as outlined below Wound 02/19/24 0200 Pressure Injury Foot Left;Lateral Unstageable - Full thickness tissue  loss in which the base of the injury is covered by slough (yellow, tan, gray, green or brown) and/or eschar (tan, brown or black) in the wound bed. (Active)   Class 1 Obesity: Estimated body mass index is 34.2 kg/m as calculated from the following:   Height as of this encounter: 5' 6 (1.676 m).   Weight as of this encounter: 96.1 kg.   Code status:   Code Status: Full Code   DVT Prophylaxis: heparin  injection 5,000 Units Start: 02/14/24 2315   Family Communication: None at bedside   Disposition Plan: Status is: Inpatient Remains inpatient appropriate because: Severity of illness   Planned Discharge Destination:Skilled nursing facility   Diet: Diet Order             Diet regular Room service appropriate? Yes; Fluid consistency: Thin; Fluid restriction: 1200 mL Fluid  Diet effective now                     Antimicrobial agents: Anti-infectives (From admission, onward)    Start     Dose/Rate Route Frequency Ordered Stop   02/21/24 1200  vancomycin  (VANCOCIN ) IVPB 1000 mg/200 mL premix       Placed in Followed by Linked Group   1,000 mg 200 mL/hr over 60 Minutes Intravenous Every T-Th-Sa (Hemodialysis) 02/20/24 1458     02/21/24 1200  ceFAZolin  (ANCEF ) IVPB 2g/100 mL premix       Placed in Followed by Linked Group   2 g 200 mL/hr over 30 Minutes Intravenous Every T-Th-Sa (Hemodialysis) 02/20/24 1458     02/20/24 2200  metroNIDAZOLE  (FLAGYL ) tablet 500 mg        500 mg Oral Every 12 hours 02/20/24 1458     02/20/24 1545  vancomycin  (VANCOREADY) IVPB 2000 mg/400 mL       Placed in Followed by Linked Group   2,000 mg 200 mL/hr over 120 Minutes Intravenous  Once 02/20/24 1458 02/20/24 1818   02/20/24 1545  ceFAZolin  (ANCEF ) IVPB 2g/100 mL premix       Placed in Followed by Linked Group   2 g 200 mL/hr over 30 Minutes Intravenous  Once 02/20/24 1458 02/20/24 1621   02/18/24 1800  meropenem  (MERREM ) 500 mg in sodium chloride  0.9 % 100 mL IVPB  Status:   Discontinued        500 mg 200 mL/hr over 30 Minutes Intravenous Every 24 hours 02/17/24 0941 02/20/24 1458   02/17/24 1030  meropenem  (MERREM ) 500 mg in sodium chloride  0.9 % 100 mL IVPB        500 mg 200 mL/hr over 30 Minutes Intravenous  Once 02/17/24 0941 02/17/24 1059   02/17/24 1000  meropenem  (MERREM ) 500 mg in sodium chloride  0.9 % 100 mL IVPB  Status:  Discontinued        500 mg 200 mL/hr over 30 Minutes Intravenous Every 24 hours 02/16/24 1045 02/17/24 0941   02/16/24 1145  linezolid  (ZYVOX ) IVPB 600 mg  Status:  Discontinued        600 mg 300 mL/hr over 60 Minutes Intravenous Every 12 hours 02/16/24 1045 02/20/24 1458   02/16/24 1145  meropenem  (MERREM ) 500 mg in sodium chloride  0.9 % 100 mL IVPB  Status:  Discontinued        500 mg 200 mL/hr over 30 Minutes Intravenous  Once 02/16/24 1045 02/17/24 0941   02/15/24 2000  ceFEPIme  (MAXIPIME ) 1 g in sodium chloride  0.9 % 100 mL IVPB  Status:  Discontinued        1 g 200 mL/hr over 30 Minutes Intravenous Every 24 hours 02/14/24 1649 02/16/24 1045   02/15/24 1700  vancomycin  (VANCOCIN ) IVPB 1000 mg/200 mL premix        1,000 mg 200 mL/hr over 60 Minutes Intravenous  Once 02/15/24 1528 02/15/24 2015   02/15/24 1415  vancomycin  variable dose per unstable renal function (pharmacist dosing)  Status:  Discontinued         Does not apply See admin instructions 02/15/24 1415 02/16/24 1045   02/15/24 0600  metroNIDAZOLE  (FLAGYL ) IVPB 500 mg  Status:  Discontinued        500 mg 100 mL/hr over 60 Minutes Intravenous Every 12 hours 02/14/24 2305 02/16/24 1046   02/14/24 1445  aztreonam  (AZACTAM ) 2 g in sodium chloride  0.9 % 100 mL IVPB  Status:  Discontinued        2 g 200 mL/hr over 30 Minutes Intravenous  Once 02/14/24 1435 02/14/24 1440   02/14/24 1445  metroNIDAZOLE  (FLAGYL ) IVPB 500 mg        500 mg 100 mL/hr over 60 Minutes Intravenous  Once 02/14/24 1435 02/14/24 1844   02/14/24 1445  vancomycin  (VANCOCIN ) IVPB 1000 mg/200 mL premix   Status:  Discontinued        1,000 mg 200 mL/hr over 60 Minutes Intravenous  Once 02/14/24 1435 02/14/24 1444   02/14/24 1445  ceFEPIme  (MAXIPIME ) 2 g in sodium chloride  0.9 % 100 mL IVPB        2 g 200 mL/hr over 30 Minutes Intravenous  Once 02/14/24 1444 02/14/24 1606   02/14/24 1445  vancomycin  (VANCOREADY) IVPB 2000 mg/400 mL        2,000 mg 200 mL/hr over 120 Minutes Intravenous  Once 02/14/24 1444 02/14/24 1848        MEDICATIONS: Scheduled Meds:  allopurinol   100 mg Per Tube BID   atorvastatin   10 mg Per Tube QPM   cholestyramine  light  4 g Per Tube BID   famotidine   20 mg Per Tube QAC breakfast   feeding supplement (OSMOLITE 1.5 CAL)  960 mL Per Tube Q24H   ferric citrate   420 mg Oral TID WC   folic acid   1 mg Per Tube Daily   Gerhardt's butt cream   Topical TID   heparin   5,000 Units Subcutaneous Q8H   hydrocerin  1 Application Topical BID   hyoscyamine   0.125 mg Sublingual TID   insulin  aspart  0-6 Units Subcutaneous Q4H   metroNIDAZOLE   500 mg Oral Q12H   midodrine   10 mg Per Tube TID   mirtazapine   15 mg Per Tube QHS   pantoprazole   40 mg Oral Daily   risperiDONE   1 mg Oral QHS   Continuous Infusions:   ceFAZolin  (ANCEF ) IV Stopped (02/23/24 1254)   vancomycin  Stopped (02/23/24 1223)   PRN Meds:.acetaminophen , albuterol , HYDROcodone -acetaminophen , HYDROmorphone  (DILAUDID ) injection, hydrOXYzine , melatonin, ondansetron  (ZOFRAN ) IV, polyethylene glycol, sodium chloride  flush, traZODone    I have personally reviewed following labs and imaging studies  LABORATORY DATA: CBC: Recent Labs  Lab 02/20/24 0147 02/21/24 0416 02/22/24 0500 02/23/24 0840 02/24/24 0800  WBC 19.9* 20.5* 18.9* 21.5* 18.0*  NEUTROABS  --   --  13.9*  --  12.8*  HGB 7.9* 8.4* 7.8* 8.0* 7.2*  HCT 24.9* 26.8* 25.2* 25.5* 23.2*  MCV 87.4 89.6 90.3 92.4 92.8  PLT 212 243 232 254 255    Basic Metabolic Panel: Recent Labs  Lab 02/19/24 1813 02/20/24 0147 02/21/24 0416  02/22/24 0500 02/23/24 0840  NA 136 134* 136 132* 133*  K 3.7 3.7 4.3 4.6 4.8  CL 102 99 101 98 100  CO2 26 26 20* 26 27  GLUCOSE 195* 201* 200* 230* 157*  BUN 8 9 16 13 20   CREATININE 2.55* 2.83* 3.88* 2.70* 3.82*  CALCIUM  7.4* 7.1* 8.0* 7.9* 8.4*  MG 1.9  --   --   --   --   PHOS 2.5 2.8 2.9 2.1* 3.3    GFR: Estimated Creatinine Clearance: 18.5 mL/min (A) (by C-G formula based on SCr of 3.82 mg/dL (H)).  Liver Function Tests: Recent Labs  Lab 02/19/24 1813 02/20/24 0147 02/21/24 0416 02/22/24 0500 02/23/24 0840  ALBUMIN  1.7* 1.6* 1.7* 2.2* 2.3*   No results for input(s): LIPASE, AMYLASE in the last 168 hours. Recent Labs  Lab 02/17/24 1300  AMMONIA 18    Coagulation Profile: No results for input(s): INR, PROTIME in the last 168 hours.  Cardiac Enzymes: No results for input(s): CKTOTAL, CKMB, CKMBINDEX, TROPONINI in the last 168 hours.  BNP (last 3 results) No results for input(s): PROBNP in the last 8760 hours.  Lipid Profile: No results for input(s): CHOL, HDL, LDLCALC, TRIG, CHOLHDL, LDLDIRECT in the last 72 hours.  Thyroid  Function Tests: No results for input(s): TSH, T4TOTAL, FREET4, T3FREE, THYROIDAB in the last 72 hours.  Anemia Panel: No results for input(s): VITAMINB12, FOLATE, FERRITIN, TIBC, IRON , RETICCTPCT in the last 72 hours.  Urine analysis:    Component Value Date/Time   COLORURINE STRAW (A) 06/19/2022 0638   APPEARANCEUR HAZY (A) 06/19/2022 0638   LABSPEC 1.010 06/19/2022 0638   PHURINE 5.0 06/19/2022 0638   GLUCOSEU NEGATIVE 06/19/2022 0638   HGBUR SMALL (A) 06/19/2022 0638   BILIRUBINUR NEGATIVE 06/19/2022 0638   KETONESUR NEGATIVE 06/19/2022 0638   PROTEINUR 30 (A) 06/19/2022 0638   UROBILINOGEN 0.2 10/02/2013 2108   NITRITE NEGATIVE 06/19/2022 0638   LEUKOCYTESUR LARGE (A) 06/19/2022 0638    Sepsis Labs: Lactic Acid, Venous    Component Value Date/Time   LATICACIDVEN  0.7 02/17/2024 1218    MICROBIOLOGY: Recent Results (from the past 240 hours)  Culture, blood (single)     Status: None   Collection Time: 02/14/24  1:10 PM   Specimen: BLOOD RIGHT FOREARM  Result Value Ref Range Status   Specimen Description BLOOD RIGHT FOREARM  Final   Special Requests   Final    BOTTLES DRAWN AEROBIC AND ANAEROBIC Blood Culture results may not be optimal due to an inadequate volume of blood received in culture bottles   Culture   Final    NO GROWTH 5 DAYS Performed at [facility], [address], [place], [place] [address]    Report Status 02/19/2024 FINAL  Final  Resp panel by RT-PCR (RSV, Flu A&B, Covid) Anterior Nasal Swab     Status: None   Collection Time: 02/14/24  3:23 PM   Specimen: Anterior Nasal Swab  Result Value Ref Range Status   SARS Coronavirus 2 by RT PCR NEGATIVE NEGATIVE Final   Influenza A by PCR NEGATIVE NEGATIVE Final   Influenza B by PCR NEGATIVE NEGATIVE Final    Comment: (NOTE) The Xpert Xpress SARS-CoV-2/FLU/RSV plus assay is  intended as an aid in the diagnosis of influenza from Nasopharyngeal swab specimens and should not be used as a sole basis for treatment. Nasal washings and aspirates are unacceptable for Xpert Xpress SARS-CoV-2/FLU/RSV testing.  Fact Sheet for Patients: bloggercourse.com  Fact Sheet for Healthcare Providers: seriousbroker.it  This test is not yet approved or cleared by the [place]  FDA and has been authorized for detection and/or diagnosis of SARS-CoV-2 by FDA under an Emergency Use Authorization (EUA). This EUA will remain in effect (meaning this test can be used) for the duration of the COVID-19 declaration under Section 564(b)(1) of the Act, 21 U.S.C. section 360bbb-3(b)(1), unless the authorization is terminated or revoked.     Resp Syncytial Virus by PCR NEGATIVE NEGATIVE Final    Comment: (NOTE) Fact Sheet for  Patients: bloggercourse.com  Fact Sheet for Healthcare Providers: seriousbroker.it  This test is not yet approved or cleared by the [place]  FDA and has been authorized for detection and/or diagnosis of SARS-CoV-2 by FDA under an Emergency Use Authorization (EUA). This EUA will remain in effect (meaning this test can be used) for the duration of the COVID-19 declaration under Section 564(b)(1) of the Act, 21 U.S.C. section 360bbb-3(b)(1), unless the authorization is terminated or revoked.  Performed at [facility], [address], [place], [place] [address]   Culture, blood (Routine X 2) w Reflex to ID Panel     Status: None   Collection Time: 02/16/24  4:12 AM   Specimen: BLOOD RIGHT HAND  Result Value Ref Range Status   Specimen Description BLOOD RIGHT HAND  Final   Special Requests   Final    BOTTLES DRAWN AEROBIC ONLY Blood Culture results may not be optimal due to an inadequate volume of blood received in culture bottles   Culture   Final    NO GROWTH 5 DAYS Performed at [facility], [address] [place], [place] [address]    Report Status 02/21/2024 FINAL  Final  Culture, blood (Routine X 2) w Reflex to ID Panel     Status: None   Collection Time: 02/16/24  4:14 AM   Specimen: BLOOD RIGHT HAND  Result Value Ref Range Status   Specimen Description BLOOD RIGHT HAND  Final   Special Requests   Final    BOTTLES DRAWN AEROBIC ONLY Blood Culture results may not be optimal due to an inadequate volume of blood received in culture bottles   Culture   Final    NO GROWTH 5 DAYS Performed at [facility], [address], [place], [place] [address]    Report Status 02/21/2024 FINAL  Final  Aerobic/Anaerobic Culture w Gram Stain (surgical/deep wound)     Status: None   Collection Time: 02/16/24  2:30 PM   Specimen: Path fluid; Body Fluid  Result Value Ref Range Status   Specimen Description FLUID  Final    Special Requests LEFT THIGH ABSCESS  Final   Gram Stain   Final    ABUNDANT WBC PRESENT, PREDOMINANTLY PMN RARE GRAM NEGATIVE RODS FEW GRAM POSITIVE RODS RARE GRAM POSITIVE COCCI    Culture   Final    MODERATE PROTEUS MIRABILIS FEW ENTEROCOCCUS AVIUM FEW PREVOTELLA BUCCAE BETA LACTAMASE POSITIVE Performed at [facility], [address], [place], [place] [address]    Report Status 02/20/2024 FINAL  Final   Organism ID, Bacteria PROTEUS MIRABILIS  Final   Organism ID, Bacteria ENTEROCOCCUS AVIUM  Final  Susceptibility   Enterococcus avium - MIC*    AMPICILLIN <=2 SENSITIVE Sensitive     VANCOMYCIN  <=0.5 SENSITIVE Sensitive     GENTAMICIN SYNERGY SENSITIVE Sensitive     * FEW ENTEROCOCCUS AVIUM   Proteus mirabilis - MIC*    AMPICILLIN <=2 SENSITIVE Sensitive     CEFAZOLIN  (NON-URINE) 2 SENSITIVE Sensitive     CEFEPIME  <=0.12 SENSITIVE Sensitive     ERTAPENEM <=0.12 SENSITIVE Sensitive     CEFTRIAXONE  <=0.25 SENSITIVE Sensitive     CIPROFLOXACIN  <=0.06 SENSITIVE Sensitive     GENTAMICIN <=1 SENSITIVE Sensitive     MEROPENEM  0.5 SENSITIVE Sensitive     TRIMETH/SULFA <=20 SENSITIVE Sensitive     AMPICILLIN/SULBACTAM <=2 SENSITIVE Sensitive     PIP/TAZO Value in next row Sensitive      <=4 SENSITIVEThis is a modified FDA-approved test that has been validated and its performance characteristics determined by the reporting laboratory.  This laboratory is certified under the Clinical Laboratory Improvement Amendments CLIA as qualified to perform high complexity clinical laboratory testing.    * MODERATE PROTEUS MIRABILIS    RADIOLOGY STUDIES/RESULTS: No results found.   LOS: 10 days   Donalda Applebaum, MD  [facility]    To contact the attending provider between 7A-7P or the covering provider during after hours 7P-7A, please log into the web site [email or web address] and access using universal [place] password for that web site. If you do not have the password,  please call the hospital operator.  02/24/2024, 10:22 AM    "

## 2024-02-24 NOTE — Progress Notes (Signed)
 PT Cancellation Note  Patient Details Name: Deborah [name] MRN: [id number] DOB: [birth year]   Cancelled Treatment:     Pt declining session at this time due to pain levels. Pt encouraged to attempt to mobilize to prevent deconditioning. Pt requesting to wait until tomorrow. Will follow up as appropriate next session.   [name] Morel, PT, DPT  Acute Rehabilitation Services         Office: [phone]      [name] 02/24/2024, 2:30 PM

## 2024-02-24 NOTE — TOC Progression Note (Signed)
 Transition of Care [facility] - Progression Note    Patient Details  Name: Deborah [name] MRN: [id number] Date of Birth: 1965/12/17  Transition of Care [facility]) CM/SW Contact  Inocente GORMAN Kindle, LCSW Phone Number: 02/24/2024, 4:06 PM  Clinical Narrative:    Updated [facility] and requested patient be placed on first shift for HD tm in the event she needs to return to SNF afterwards.    Expected Discharge Plan: Skilled Nursing Facility Barriers to Discharge: Continued Medical Work up               Expected Discharge Plan and Services In-house Referral: Clinical Social Work   Post Acute Care Choice: Skilled Nursing Facility Living arrangements for the past 2 months: Skilled Nursing Facility                                       Social Drivers of Health (SDOH) Interventions SDOH Screenings   Food Insecurity: Patient Unable To Answer (02/18/2024)  Housing: Patient Declined (02/18/2024)  Transportation Needs: Patient Unable To Answer (02/18/2024)  Utilities: Not At Risk (02/20/2024)  Recent Concern: Utilities - At Risk (02/18/2024)  Depression (PHQ2-9): Medium Risk (03/16/2023)  Financial Resource Strain: Low Risk (03/23/2021)   Received from [facility])  Social Connections: Low Risk (03/23/2021)   Received from [facility])  Stress: Low Risk (03/23/2021)   Received from [facility])  Tobacco Use: Low Risk (02/16/2024)    Readmission Risk Interventions    02/20/2024   11:05 AM 03/07/2023    4:19 PM 11/25/2022    5:16 PM  Readmission Risk Prevention Plan  Transportation Screening Complete Complete Complete  Medication Review (RN Care Manager) Complete Complete Complete  PCP or Specialist appointment within 3-5 days of discharge Complete Complete Complete  HRI or Home Care Consult Complete Complete Complete  SW Recovery Care/Counseling Consult Complete Complete Complete  Palliative Care Screening Not  Applicable Complete Not Applicable  Skilled Nursing Facility Complete Not Applicable Not Applicable

## 2024-02-24 NOTE — Progress Notes (Signed)
 [facility] night cross cover note:  Per patient's request, I have added as needed Imodium  for loose stool noting that the patient has a known left thigh wound with dressing in place.   Eva Pore, DO Hospitalist

## 2024-02-25 DIAGNOSIS — G9340 Encephalopathy, unspecified: Secondary | ICD-10-CM | POA: Diagnosis not present

## 2024-02-25 DIAGNOSIS — N186 End stage renal disease: Secondary | ICD-10-CM | POA: Diagnosis not present

## 2024-02-25 DIAGNOSIS — Z992 Dependence on renal dialysis: Secondary | ICD-10-CM | POA: Diagnosis not present

## 2024-02-25 DIAGNOSIS — A419 Sepsis, unspecified organism: Secondary | ICD-10-CM | POA: Diagnosis not present

## 2024-02-25 LAB — CBC
HCT: 24.6 % — ABNORMAL LOW (ref 36.0–46.0)
Hemoglobin: 7.7 g/dL — ABNORMAL LOW (ref 12.0–15.0)
MCH: 29.3 pg (ref 26.0–34.0)
MCHC: 31.3 g/dL (ref 30.0–36.0)
MCV: 93.5 fL (ref 80.0–100.0)
Platelets: 306 K/uL (ref 150–400)
RBC: 2.63 MIL/uL — ABNORMAL LOW (ref 3.87–5.11)
RDW: 17.7 % — ABNORMAL HIGH (ref 11.5–15.5)
WBC: 16.2 K/uL — ABNORMAL HIGH (ref 4.0–10.5)
nRBC: 0 % (ref 0.0–0.2)

## 2024-02-25 LAB — GLUCOSE, CAPILLARY
Glucose-Capillary: 101 mg/dL — ABNORMAL HIGH (ref 70–99)
Glucose-Capillary: 115 mg/dL — ABNORMAL HIGH (ref 70–99)
Glucose-Capillary: 115 mg/dL — ABNORMAL HIGH (ref 70–99)
Glucose-Capillary: 70 mg/dL (ref 70–99)

## 2024-02-25 MED ORDER — GERHARDT'S BUTT CREAM: 1.0000 | TOPICAL_CREAM | Freq: Three times a day (TID) | CUTANEOUS | Status: AC

## 2024-02-25 MED ORDER — METRONIDAZOLE 500 MG PO TABS: 500.0000 mg | ORAL_TABLET | Freq: Two times a day (BID) | ORAL | Status: AC

## 2024-02-25 MED ORDER — HYDROCODONE-ACETAMINOPHEN 5-325 MG PO TABS: 1.0000 | ORAL_TABLET | Freq: Four times a day (QID) | ORAL | 0 refills | Status: AC | PRN

## 2024-02-25 MED ORDER — HYDROMORPHONE HCL 1 MG/ML IJ SOLN
INTRAMUSCULAR | Status: AC
  Filled 2024-02-25: qty 2

## 2024-02-25 MED ORDER — VANCOMYCIN HCL IN DEXTROSE 1-5 GM/200ML-% IV SOLN: 1000.0000 mg | INTRAVENOUS | Status: AC

## 2024-02-25 MED ORDER — HEPARIN SODIUM (PORCINE) 1000 UNIT/ML IJ SOLN
INTRAMUSCULAR | Status: AC
  Filled 2024-02-25: qty 4

## 2024-02-25 MED ORDER — HYDROMORPHONE HCL 2 MG PO TABS: 2.0000 mg | ORAL_TABLET | Freq: Four times a day (QID) | ORAL | 0 refills | Status: AC | PRN

## 2024-02-25 MED ORDER — CEFAZOLIN SODIUM-DEXTROSE 2-4 GM/100ML-% IV SOLN: 2.0000 g | INTRAVENOUS | Status: AC

## 2024-02-25 MED ORDER — MIDODRINE HCL 5 MG PO TABS
ORAL_TABLET | ORAL | Status: AC
  Filled 2024-02-25: qty 2

## 2024-02-25 MED ORDER — HYDROCERIN EX CREA: 1.0000 | TOPICAL_CREAM | Freq: Two times a day (BID) | CUTANEOUS | Status: AC

## 2024-02-25 MED ORDER — HEPARIN SODIUM (PORCINE) 1000 UNIT/ML IJ SOLN
3800.0000 [IU] | Freq: Once | INTRAMUSCULAR | Status: AC
  Administered 2024-02-25: 3800 [IU]

## 2024-02-25 NOTE — Progress Notes (Signed)
 "                        PROGRESS NOTE        PATIENT DETAILS Name: Deborah [name] Age: 58 y.o. Sex: female Date of Birth: 12-Jun-1965 Admit Date: 02/14/2024 Admitting Physician Elgin DELENA Lam, MD PCP:Pcp, No  Brief Summary: Patient is a 58 y.o.  female with history of ESRD on HD, PAF, HLD, right BKA who presented to the hospital with altered mental status-she was found to have severe sepsis secondary to left thigh abscess-evaluated by general surgery underwent I&D on 12/11.  Significant events: 12/9>> admit to [facility]  Significant studies: 12/9>> CXR: No PNA 12/9>> CT head: No acute intracranial abnormality 12/11>> CT abdomen/pelvis: Fluid collection-left thigh 3.3 x 15.5 cm 12/11>> CT left hip: Abscess in the left anterior upper leg 12/12>> MRI brain: No acute intracranial abnormality 12/12>> Spot EEG: No seizures  Significant microbiology data: 12/9>> COVID/influenza/RSV PCR: Negative 12/9>> blood cultures: No growth 12/11>> left thigh abscess: Proteus/Enterococcus avium/Prevotella 12/11>> blood culture: No growth  Procedures: 12/11>> I&D left thigh abscess  Consults: General surgery PCCM Nephrology  Subjective: Continues to complain of pain in the left groin area.  Objective: Vitals: Blood pressure 100/67, pulse (!) 116, temperature 99.1 F (37.3 C), resp. rate 16, height 5' 6 (1.676 m), weight 97.7 kg, last menstrual period 10/10/2012, SpO2 100%.   Exam: Awake/alert Chest: Clear to auscultation CVS: S1-S2 regular Abdomen: Soft Right BKA Left groin-dressing in place-do not see any changes from prior exam-no obvious fluctuation-some induration on the inferior aspect of the incision.  Pertinent Labs/Radiology:    Latest Ref Rng & Units 02/25/2024    8:00 AM 02/24/2024    8:00 AM 02/23/2024    8:40 AM  CBC  WBC 4.0 - 10.5 K/uL 16.2  18.0  21.5   Hemoglobin 12.0 - 15.0 g/dL 7.7  7.2  8.0   Hematocrit 36.0 - 46.0 % 24.6  23.2  25.5   Platelets 150 -  400 K/uL 306  255  254     Lab Results  Component Value Date   NA 133 (L) 02/23/2024   K 4.8 02/23/2024   CL 100 02/23/2024   CO2 27 02/23/2024      Assessment/Plan: Severe sepsis secondary to left thigh abscess Overall improved-leukocytosis finally downtrending Awaiting CT results-done on 12/19-I have called radiology reading room x 2 this morning to get it read In the interim-continue IV antibiotics  Tried to remove central line yesterday but she has no other IV access-hence we will keep for now.  Acute metabolic encephalopathy Secondary to sepsis physiology Neuroimaging/EEG as above Clinically improved-back to baseline  Oropharyngeal dysphagia Improved-NG tube dislodged 12/18-tolerating regular diet.  ESRD on HD TTS Nephrology following  Normocytic anemia Related to ESRD Did receive 1 unit of PRBC 12/12 Follow Hb-and transfuse accordingly  Chronic hypotension Continue midodrine -BP soft but stable  DM-2 (A1c 4.3 on 12/10) CBG stable SSI   Recent Labs    02/24/24 2356 02/25/24 0420 02/25/24 0755  GLUCAP 101* 115* 70     HLD Statin  PAF Telemetry monitoring Not on anticoagulation due to prior history of GI bleeding  Gout Allopurinol   Depression/anxiety Psych meds initially held due to encephalopathy-since clinically improved-risperidone /Remeron /as needed Atarax  has been resumed-which she seems to be tolerating well. Monitor/follow  Subserosal fibroid extending along left adnexa Incidental finding on CT imaging Stable for outpatient follow-up  Left inguinal lymphadenopathy Likely reactive Stable for outpatient follow-up  Nutrition Status: Nutrition Problem: Inadequate oral intake Etiology: inability to eat Signs/Symptoms: NPO status Interventions: Tube feeding, Refer to RD note for recommendations  Pressure Ulcer: Agree with assessment as outlined below Wound 02/19/24 0200 Pressure Injury Foot Left;Lateral Unstageable - Full thickness  tissue loss in which the base of the injury is covered by slough (yellow, tan, gray, green or brown) and/or eschar (tan, brown or black) in the wound bed. (Active)   Class 1 Obesity: Estimated body mass index is 34.76 kg/m as calculated from the following:   Height as of this encounter: 5' 6 (1.676 m).   Weight as of this encounter: 97.7 kg.   Code status:   Code Status: Full Code   DVT Prophylaxis: heparin  injection 5,000 Units Start: 02/14/24 2315   Family Communication: None at bedside   Disposition Plan: Status is: Inpatient Remains inpatient appropriate because: Severity of illness   Planned Discharge Destination:Skilled nursing facility   Diet: Diet Order             Diet regular Room service appropriate? Yes; Fluid consistency: Thin; Fluid restriction: 1200 mL Fluid  Diet effective now                     Antimicrobial agents: Anti-infectives (From admission, onward)    Start     Dose/Rate Route Frequency Ordered Stop   02/21/24 1200  vancomycin  (VANCOCIN ) IVPB 1000 mg/200 mL premix       Placed in Followed by Linked Group   1,000 mg 200 mL/hr over 60 Minutes Intravenous Every T-Th-Sa (Hemodialysis) 02/20/24 1458     02/21/24 1200  ceFAZolin  (ANCEF ) IVPB 2g/100 mL premix       Placed in Followed by Linked Group   2 g 200 mL/hr over 30 Minutes Intravenous Every T-Th-Sa (Hemodialysis) 02/20/24 1458     02/20/24 2200  metroNIDAZOLE  (FLAGYL ) tablet 500 mg        500 mg Oral Every 12 hours 02/20/24 1458     02/20/24 1545  vancomycin  (VANCOREADY) IVPB 2000 mg/400 mL       Placed in Followed by Linked Group   2,000 mg 200 mL/hr over 120 Minutes Intravenous  Once 02/20/24 1458 02/20/24 1818   02/20/24 1545  ceFAZolin  (ANCEF ) IVPB 2g/100 mL premix       Placed in Followed by Linked Group   2 g 200 mL/hr over 30 Minutes Intravenous  Once 02/20/24 1458 02/20/24 1621   02/18/24 1800  meropenem  (MERREM ) 500 mg in sodium chloride  0.9 % 100 mL IVPB   Status:  Discontinued        500 mg 200 mL/hr over 30 Minutes Intravenous Every 24 hours 02/17/24 0941 02/20/24 1458   02/17/24 1030  meropenem  (MERREM ) 500 mg in sodium chloride  0.9 % 100 mL IVPB        500 mg 200 mL/hr over 30 Minutes Intravenous  Once 02/17/24 0941 02/17/24 1059   02/17/24 1000  meropenem  (MERREM ) 500 mg in sodium chloride  0.9 % 100 mL IVPB  Status:  Discontinued        500 mg 200 mL/hr over 30 Minutes Intravenous Every 24 hours 02/16/24 1045 02/17/24 0941   02/16/24 1145  linezolid  (ZYVOX ) IVPB 600 mg  Status:  Discontinued        600 mg 300 mL/hr over 60 Minutes Intravenous Every 12 hours 02/16/24 1045 02/20/24 1458   02/16/24 1145  meropenem  (MERREM ) 500 mg in sodium chloride  0.9 % 100 mL IVPB  Status:  Discontinued        500 mg 200 mL/hr over 30 Minutes Intravenous  Once 02/16/24 1045 02/17/24 0941   02/15/24 2000  ceFEPIme  (MAXIPIME ) 1 g in sodium chloride  0.9 % 100 mL IVPB  Status:  Discontinued        1 g 200 mL/hr over 30 Minutes Intravenous Every 24 hours 02/14/24 1649 02/16/24 1045   02/15/24 1700  vancomycin  (VANCOCIN ) IVPB 1000 mg/200 mL premix        1,000 mg 200 mL/hr over 60 Minutes Intravenous  Once 02/15/24 1528 02/15/24 2015   02/15/24 1415  vancomycin  variable dose per unstable renal function (pharmacist dosing)  Status:  Discontinued         Does not apply See admin instructions 02/15/24 1415 02/16/24 1045   02/15/24 0600  metroNIDAZOLE  (FLAGYL ) IVPB 500 mg  Status:  Discontinued        500 mg 100 mL/hr over 60 Minutes Intravenous Every 12 hours 02/14/24 2305 02/16/24 1046   02/14/24 1445  aztreonam  (AZACTAM ) 2 g in sodium chloride  0.9 % 100 mL IVPB  Status:  Discontinued        2 g 200 mL/hr over 30 Minutes Intravenous  Once 02/14/24 1435 02/14/24 1440   02/14/24 1445  metroNIDAZOLE  (FLAGYL ) IVPB 500 mg        500 mg 100 mL/hr over 60 Minutes Intravenous  Once 02/14/24 1435 02/14/24 1844   02/14/24 1445  vancomycin  (VANCOCIN ) IVPB 1000 mg/200  mL premix  Status:  Discontinued        1,000 mg 200 mL/hr over 60 Minutes Intravenous  Once 02/14/24 1435 02/14/24 1444   02/14/24 1445  ceFEPIme  (MAXIPIME ) 2 g in sodium chloride  0.9 % 100 mL IVPB        2 g 200 mL/hr over 30 Minutes Intravenous  Once 02/14/24 1444 02/14/24 1606   02/14/24 1445  vancomycin  (VANCOREADY) IVPB 2000 mg/400 mL        2,000 mg 200 mL/hr over 120 Minutes Intravenous  Once 02/14/24 1444 02/14/24 1848        MEDICATIONS: Scheduled Meds:  allopurinol   100 mg Per Tube BID   atorvastatin   10 mg Per Tube QPM   cholestyramine  light  4 g Per Tube BID   darbepoetin (ARANESP ) injection - DIALYSIS  200 mcg Subcutaneous Q Sat-1800   famotidine   20 mg Per Tube QAC breakfast   feeding supplement (OSMOLITE 1.5 CAL)  960 mL Per Tube Q24H   ferric citrate   420 mg Oral TID WC   folic acid   1 mg Per Tube Daily   Gerhardt's butt cream   Topical TID   heparin   5,000 Units Subcutaneous Q8H   heparin  sodium (porcine)  3,800 Units Intracatheter Once   hydrocerin  1 Application Topical BID   hyoscyamine   0.125 mg Sublingual TID   insulin  aspart  0-6 Units Subcutaneous Q4H   metroNIDAZOLE   500 mg Oral Q12H   midodrine   10 mg Per Tube TID   mirtazapine   15 mg Per Tube QHS   pantoprazole   40 mg Oral Daily   risperiDONE   1 mg Oral QHS   Continuous Infusions:   ceFAZolin  (ANCEF ) IV Stopped (02/23/24 1254)   vancomycin  Stopped (02/23/24 1223)   PRN Meds:.acetaminophen , albuterol , HYDROcodone -acetaminophen , HYDROmorphone  (DILAUDID ) injection, hydrOXYzine , loperamide , melatonin, ondansetron  (ZOFRAN ) IV, polyethylene glycol, sodium chloride  flush, traZODone    I have personally reviewed following labs and imaging studies  LABORATORY DATA: CBC: Recent Labs  Lab 02/21/24 0416 02/22/24 0500  02/23/24 0840 02/24/24 0800 02/25/24 0800  WBC 20.5* 18.9* 21.5* 18.0* 16.2*  NEUTROABS  --  13.9*  --  12.8*  --   HGB 8.4* 7.8* 8.0* 7.2* 7.7*  HCT 26.8* 25.2* 25.5* 23.2* 24.6*   MCV 89.6 90.3 92.4 92.8 93.5  PLT 243 232 254 255 306    Basic Metabolic Panel: Recent Labs  Lab 02/19/24 1813 02/20/24 0147 02/21/24 0416 02/22/24 0500 02/23/24 0840  NA 136 134* 136 132* 133*  K 3.7 3.7 4.3 4.6 4.8  CL 102 99 101 98 100  CO2 26 26 20* 26 27  GLUCOSE 195* 201* 200* 230* 157*  BUN 8 9 16 13 20   CREATININE 2.55* 2.83* 3.88* 2.70* 3.82*  CALCIUM  7.4* 7.1* 8.0* 7.9* 8.4*  MG 1.9  --   --   --   --   PHOS 2.5 2.8 2.9 2.1* 3.3    GFR: Estimated Creatinine Clearance: 18.7 mL/min (A) (by C-G formula based on SCr of 3.82 mg/dL (H)).  Liver Function Tests: Recent Labs  Lab 02/19/24 1813 02/20/24 0147 02/21/24 0416 02/22/24 0500 02/23/24 0840  ALBUMIN  1.7* 1.6* 1.7* 2.2* 2.3*   No results for input(s): LIPASE, AMYLASE in the last 168 hours. No results for input(s): AMMONIA in the last 168 hours.   Coagulation Profile: No results for input(s): INR, PROTIME in the last 168 hours.  Cardiac Enzymes: No results for input(s): CKTOTAL, CKMB, CKMBINDEX, TROPONINI in the last 168 hours.  BNP (last 3 results) No results for input(s): PROBNP in the last 8760 hours.  Lipid Profile: No results for input(s): CHOL, HDL, LDLCALC, TRIG, CHOLHDL, LDLDIRECT in the last 72 hours.  Thyroid  Function Tests: No results for input(s): TSH, T4TOTAL, FREET4, T3FREE, THYROIDAB in the last 72 hours.  Anemia Panel: No results for input(s): VITAMINB12, FOLATE, FERRITIN, TIBC, IRON , RETICCTPCT in the last 72 hours.  Urine analysis:    Component Value Date/Time   COLORURINE STRAW (A) 06/19/2022 0638   APPEARANCEUR HAZY (A) 06/19/2022 0638   LABSPEC 1.010 06/19/2022 0638   PHURINE 5.0 06/19/2022 0638   GLUCOSEU NEGATIVE 06/19/2022 0638   HGBUR SMALL (A) 06/19/2022 0638   BILIRUBINUR NEGATIVE 06/19/2022 0638   KETONESUR NEGATIVE 06/19/2022 0638   PROTEINUR 30 (A) 06/19/2022 0638   UROBILINOGEN 0.2 10/02/2013 2108    NITRITE NEGATIVE 06/19/2022 0638   LEUKOCYTESUR LARGE (A) 06/19/2022 0638    Sepsis Labs: Lactic Acid, Venous    Component Value Date/Time   LATICACIDVEN 0.7 02/17/2024 1218    MICROBIOLOGY: Recent Results (from the past 240 hours)  Culture, blood (Routine X 2) w Reflex to ID Panel     Status: None   Collection Time: 02/16/24  4:12 AM   Specimen: BLOOD RIGHT HAND  Result Value Ref Range Status   Specimen Description BLOOD RIGHT HAND  Final   Special Requests   Final    BOTTLES DRAWN AEROBIC ONLY Blood Culture results may not be optimal due to an inadequate volume of blood received in culture bottles   Culture   Final    NO GROWTH 5 DAYS Performed at [facility], [address] [place], [place] [address]    Report Status 02/21/2024 FINAL  Final  Culture, blood (Routine X 2) w Reflex to ID Panel     Status: None   Collection Time: 02/16/24  4:14 AM   Specimen: BLOOD RIGHT HAND  Result Value Ref Range Status   Specimen Description BLOOD RIGHT HAND  Final  Special Requests   Final    BOTTLES DRAWN AEROBIC ONLY Blood Culture results may not be optimal due to an inadequate volume of blood received in culture bottles   Culture   Final    NO GROWTH 5 DAYS Performed at [facility], [address] [place], [place] [address]    Report Status 02/21/2024 FINAL  Final  Aerobic/Anaerobic Culture w Gram Stain (surgical/deep wound)     Status: None   Collection Time: 02/16/24  2:30 PM   Specimen: Path fluid; Body Fluid  Result Value Ref Range Status   Specimen Description FLUID  Final   Special Requests LEFT THIGH ABSCESS  Final   Gram Stain   Final    ABUNDANT WBC PRESENT, PREDOMINANTLY PMN RARE GRAM NEGATIVE RODS FEW GRAM POSITIVE RODS RARE GRAM POSITIVE COCCI    Culture   Final    MODERATE PROTEUS MIRABILIS FEW ENTEROCOCCUS AVIUM FEW PREVOTELLA BUCCAE BETA LACTAMASE POSITIVE Performed at [facility], [address], [place], [place] [address]     Report Status 02/20/2024 FINAL  Final   Organism ID, Bacteria PROTEUS MIRABILIS  Final   Organism ID, Bacteria ENTEROCOCCUS AVIUM  Final      Susceptibility   Enterococcus avium - MIC*    AMPICILLIN <=2 SENSITIVE Sensitive     VANCOMYCIN  <=0.5 SENSITIVE Sensitive     GENTAMICIN SYNERGY SENSITIVE Sensitive     * FEW ENTEROCOCCUS AVIUM   Proteus mirabilis - MIC*    AMPICILLIN <=2 SENSITIVE Sensitive     CEFAZOLIN  (NON-URINE) 2 SENSITIVE Sensitive     CEFEPIME  <=0.12 SENSITIVE Sensitive     ERTAPENEM <=0.12 SENSITIVE Sensitive     CEFTRIAXONE  <=0.25 SENSITIVE Sensitive     CIPROFLOXACIN  <=0.06 SENSITIVE Sensitive     GENTAMICIN <=1 SENSITIVE Sensitive     MEROPENEM  0.5 SENSITIVE Sensitive     TRIMETH/SULFA <=20 SENSITIVE Sensitive     AMPICILLIN/SULBACTAM <=2 SENSITIVE Sensitive     PIP/TAZO Value in next row Sensitive      <=4 SENSITIVEThis is a modified FDA-approved test that has been validated and its performance characteristics determined by the reporting laboratory.  This laboratory is certified under the Clinical Laboratory Improvement Amendments CLIA as qualified to perform high complexity clinical laboratory testing.    * MODERATE PROTEUS MIRABILIS    RADIOLOGY STUDIES/RESULTS: No results found.   LOS: 11 days   Donalda Applebaum, MD  [facility]    To contact the attending provider between 7A-7P or the covering provider during after hours 7P-7A, please log into the web site [email or web address] and access using universal [place] password for that web site. If you do not have the password, please call the hospital operator.  02/25/2024, 11:08 AM    "

## 2024-02-25 NOTE — Progress Notes (Signed)
" °   02/25/24 1211  Vitals  Temp 97.8 F (36.6 C)  Temp Source Oral  BP (!) 85/52  Pulse Rate 95  Resp 19  Type of Weight Post-Dialysis  Oxygen Therapy  SpO2 100 %  O2 Device Room Air  Patient Activity (if Appropriate) In bed  Pulse Oximetry Type Continuous  Oximetry Probe Site Changed No  During Treatment Monitoring  Blood Flow Rate (mL/min) 0 mL/min  Arterial Pressure (mmHg) 89.29 mmHg  Venous Pressure (mmHg) 37.77 mmHg  TMP (mmHg) 0 mmHg  Ultrafiltration Rate (mL/min) 691 mL/min  Dialysate Flow Rate (mL/min) 299 ml/min  Dialysate Potassium Concentration 2  Dialysate Calcium  Concentration 2.5  Duration of HD Treatment -hour(s) 3.5 hour(s)  Cumulative Fluid Removed (mL) per Treatment  800.12  HD Safety Checks Performed Yes  Intra-Hemodialysis Comments Tolerated well;Tx completed  Dialysis Fluid Bolus Normal Saline  Bolus Amount (mL) 300 mL  Post Treatment  Dialyzer Clearance Lightly streaked  Liters Processed 84  Fluid Removed (mL) 700 mL  Tolerated HD Treatment No (Comment)  Post-Hemodialysis Comments low BP prevented patient from hitting goal  Hemodialysis Catheter Right Subclavian Double lumen Permanent (Tunneled)  Placement Date/Time: 06/24/22 1644   Serial / Lot #: [id number]  Expiration Date: 09/08/26  Time Out: Correct patient;Correct site;Correct procedure  Maximum sterile barrier precautions: Hand hygiene;Mask;Cap;Sterile gloves;Sterile gown;Large sterile ...  Site Condition No complications  Blue Lumen Status Flushed;Antimicrobial dead end cap;Heparin  locked  Red Lumen Status Flushed;Antimicrobial dead end cap;Heparin  locked  Purple Lumen Status N/A  Catheter fill solution Heparin  1000 units/ml  Catheter fill volume (Arterial) 1.9 cc  Catheter fill volume (Venous) 1.9  Dressing Type Transparent  Dressing Status Clean, Dry, Intact  Drainage Description None  Dressing Change Due 02/28/24  Post treatment catheter status Capped and Clamped    "

## 2024-02-25 NOTE — Progress Notes (Signed)
 PT Cancellation Note  Patient Details Name: SHANIELLE CORRELL MRN: [id number] DOB: 1966/02/01   Cancelled Treatment:     Pt at HD this AM. Will follow up as able later today. Otherwise, pt scheduled to return to SNF this PM. Per chart review, pt is hoyer at baseline.   [name] Morel, PT, DPT  Acute Rehabilitation Services         Office: [phone]     [name] 02/25/2024, 3:07 PM

## 2024-02-25 NOTE — Progress Notes (Signed)
 " [facility] Progress Note   Subjective:    Seen and examined patient on HD. Tolerating UFG 1L and BP is 100/72. She denies any acute issues on treatment.  Objective Vitals:   02/25/24 0930 02/25/24 1000 02/25/24 1030 02/25/24 1100  BP: 112/78 (!) 84/55 100/72 100/67  Pulse: (!) 109 94 (!) 109 (!) 116  Resp: (!) 21 13 19 16   Temp:      TempSrc:      SpO2: 100% (!) 83% 100% 100%  Weight:      Height:       Physical Exam General: Chronically ill appearing, NAD. Room air Heart: RRR; no murmur Lungs: CTA anteriorly Abdomen: Soft Extremities: No LE edema Dialysis Access:  TDC + LUE AVF +t/b  Filed Weights   02/24/24 0500 02/25/24 0500 02/25/24 0806  Weight: 96.1 kg 97.1 kg 97.7 kg    Intake/Output Summary (Last 24 hours) at 02/25/2024 1138 Last data filed at 02/24/2024 1845 Gross per 24 hour  Intake 164.2 ml  Output --  Net 164.2 ml    Additional Objective Labs: Basic Metabolic Panel: Recent Labs  Lab 02/21/24 0416 02/22/24 0500 02/23/24 0840  NA 136 132* 133*  K 4.3 4.6 4.8  CL 101 98 100  CO2 20* 26 27  GLUCOSE 200* 230* 157*  BUN 16 13 20   CREATININE 3.88* 2.70* 3.82*  CALCIUM  8.0* 7.9* 8.4*  PHOS 2.9 2.1* 3.3   Liver Function Tests: Recent Labs  Lab 02/21/24 0416 02/22/24 0500 02/23/24 0840  ALBUMIN  1.7* 2.2* 2.3*   No results for input(s): LIPASE, AMYLASE in the last 168 hours. CBC: Recent Labs  Lab 02/21/24 0416 02/22/24 0500 02/23/24 0840 02/24/24 0800 02/25/24 0800  WBC 20.5* 18.9* 21.5* 18.0* 16.2*  NEUTROABS  --  13.9*  --  12.8*  --   HGB 8.4* 7.8* 8.0* 7.2* 7.7*  HCT 26.8* 25.2* 25.5* 23.2* 24.6*  MCV 89.6 90.3 92.4 92.8 93.5  PLT 243 232 254 255 306   Blood Culture    Component Value Date/Time   SDES FLUID 02/16/2024 1430   SPECREQUEST LEFT THIGH ABSCESS 02/16/2024 1430   CULT  02/16/2024 1430    MODERATE PROTEUS MIRABILIS FEW ENTEROCOCCUS AVIUM FEW PREVOTELLA BUCCAE BETA LACTAMASE  POSITIVE Performed at [facility], [address], [place], [place] [address]    REPTSTATUS 02/20/2024 FINAL 02/16/2024 1430    Cardiac Enzymes: No results for input(s): CKTOTAL, CKMB, CKMBINDEX, TROPONINI in the last 168 hours. CBG: Recent Labs  Lab 02/24/24 1545 02/24/24 1932 02/24/24 2356 02/25/24 0420 02/25/24 0755  GLUCAP 92 94 101* 115* 70   Iron  Studies: No results for input(s): IRON , TIBC, TRANSFERRIN, FERRITIN in the last 72 hours. Lab Results  Component Value Date   INR 1.5 (H) 03/22/2023   INR 1.9 (H) 12/15/2022   INR 1.8 (H) 11/23/2022   Studies/Results: No results found.  Medications:   ceFAZolin  (ANCEF ) IV Stopped (02/23/24 1254)   vancomycin  Stopped (02/23/24 1223)    allopurinol   100 mg Per Tube BID   atorvastatin   10 mg Per Tube QPM   cholestyramine  light  4 g Per Tube BID   darbepoetin (ARANESP ) injection - DIALYSIS  200 mcg Subcutaneous Q Sat-1800   famotidine   20 mg Per Tube QAC breakfast   feeding supplement (OSMOLITE 1.5 CAL)  960 mL Per Tube Q24H   ferric citrate   420 mg Oral TID WC   folic acid   1 mg Per Tube Daily   Gerhardt's  butt cream   Topical TID   heparin   5,000 Units Subcutaneous Q8H   heparin  sodium (porcine)  3,800 Units Intracatheter Once   hydrocerin  1 Application Topical BID   hyoscyamine   0.125 mg Sublingual TID   insulin  aspart  0-6 Units Subcutaneous Q4H   metroNIDAZOLE   500 mg Oral Q12H   midodrine   10 mg Per Tube TID   mirtazapine   15 mg Per Tube QHS   pantoprazole   40 mg Oral Daily   risperiDONE   1 mg Oral QHS    Dialysis Orders: TTS - South 4hr, 400/800, EDW 96.1kg, 3K/2.5Ca bath, TDC, no heparin  - Mircera 225mcg IV q 2 weeks (last given 12/6) - Hectoral 2mcg IV q HD  Assessment/Plan: Sepsis/L thigh abscess: S/p I&D 12/11. Finishing course of IV abx. Leukocytosis: Plan was CT chest - she declined. AMS: Due to #1, improved.  ESRD: Continue HD on TTS schedule - On HD. Does have a LUE AVF,  she refuses use, therefore use TDC. HypoTN/volume: BP soft at baseline, on mido 10mg  TID.  Anemia of ESRD: Hgb 7.2 - resume ESA tomorrow. S/p 1U PRBCs on 12/12 Secondary HPTH: Ca/Phos ok - monitor. NG tube out - tolerating PO per notes. Nutrition: Alb low but improving. T2DM   FYI -- Holiday Schedule for next week Usual MWF schedule -> Sun (21), Tues (23), Friday (26) Usual TTS schedule -> Mon (22), Wed (24), Sat (27)  [name], NP [facility] 02/25/2024,11:38 AM  LOS: 11 days    "

## 2024-02-25 NOTE — Discharge Summary (Signed)
 "  PATIENT DETAILS Name: Deborah [name] Age: 58 y.o. Sex: female Date of Birth: 1965-06-23 MRN: [id number]. Admitting Physician: Elgin DELENA Lam, MD PCP:Pcp, No  Admit Date: 02/14/2024 Discharge date: 02/25/2024  Recommendations for Outpatient Follow-up:  Follow up with PCP in 1-2 weeks Please obtain CMP/CBC in one week  Admitted From:  SNF  Disposition: Skilled nursing facility   Discharge Condition: good  CODE STATUS:   Code Status: Full Code   Diet recommendation:  Diet Order             Diet renal/carb modified with fluid restriction           Diet regular Room service appropriate? Yes; Fluid consistency: Thin; Fluid restriction: 1200 mL Fluid  Diet effective now                    Brief Summary: Patient is a 58 y.o.  female with history of ESRD on HD, PAF, HLD, right BKA who presented to the hospital with altered mental status-she was found to have severe sepsis secondary to left thigh abscess-evaluated by general surgery underwent I&D on 12/11.   Significant events: 12/9>> admit to [facility]   Significant studies: 12/9>> CXR: No PNA 12/9>> CT head: No acute intracranial abnormality 12/11>> CT abdomen/pelvis: Fluid collection-left thigh 3.3 x 15.5 cm 12/11>> CT left hip: Abscess in the left anterior upper leg 12/12>> MRI brain: No acute intracranial abnormality 12/12>> Spot EEG: No seizures   Significant microbiology data: 12/9>> COVID/influenza/RSV PCR: Negative 12/9>> blood cultures: No growth 12/11>> left thigh abscess: Proteus/Enterococcus avium/Prevotella 12/11>> blood culture: No growth   Procedures: 12/11>> I&D left thigh abscess   Consults: General surgery PCCM Nephrology  Brief Hospital Course: Severe sepsis secondary to left thigh abscess Overall improved-leukocytosis downtrending-repeat CT 12/20 stable without any collection Given ongoing mild inflammation at the incision/drainage site-ongoing leukocytosis-Will plan on a 10-day  course of antibiotics.   Acute metabolic encephalopathy Secondary to sepsis physiology Neuroimaging/EEG as above Clinically improved-back to baseline   Oropharyngeal dysphagia Improved-NG tube dislodged 12/18-tolerating regular diet.   ESRD on HD TTS Nephrology following   Normocytic anemia Related to ESRD Did receive 1 unit of PRBC 12/12 Follow Hb-and transfuse accordingly   Chronic hypotension Continue midodrine -BP soft but stable   DM-2 (A1c 4.3 on 12/10) CBG stable SSI    HLD Statin   PAF Telemetry monitoring Not on anticoagulation due to prior history of GI bleeding   Gout Allopurinol    Depression/anxiety Psych meds initially held due to encephalopathy-since clinically improved-risperidone /Remeron /as needed Atarax  has been resumed-which she seems to be tolerating well. Monitor/follow   Subserosal fibroid extending along left adnexa Incidental finding on CT imaging Stable for outpatient follow-up   Left inguinal lymphadenopathy Likely reactive Stable for outpatient follow-up   Nutrition Status: Nutrition Problem: Inadequate oral intake Etiology: inability to eat Signs/Symptoms: NPO status Interventions: Tube feeding, Refer to RD note for recommendations   Pressure Ulcer: Agree with assessment as outlined below Wound 02/19/24 0200 Pressure Injury Foot Left;Lateral Unstageable - Full thickness tissue loss in which the base of the injury is covered by slough (yellow, tan, gray, green or brown) and/or eschar (tan, brown or black) in the wound bed. (Active)   Nutrition Status: Nutrition Problem: Inadequate oral intake Etiology: inability to eat Signs/Symptoms: NPO status Interventions: Tube feeding, Refer to RD note for recommendations   Class 1 Obesity:  Estimated body mass index is 34.59 kg/m as calculated from the following:   Height as of this  encounter: 5' 6 (1.676 m).   Weight as of this encounter: 97.2 kg.    Discharge Diagnoses:   Principal Problem:   Altered mental status Active Problems:   Severe sepsis [facility])   Discharge Instructions:  Activity:  As tolerated with Full fall precautions use walker/cane & assistance as needed  Discharge Instructions     Call MD for:  persistant nausea and vomiting   Complete by: As directed    Call MD for:  redness, tenderness, or signs of infection (pain, swelling, redness, odor or green/yellow discharge around incision site)   Complete by: As directed    Diet renal/carb modified with fluid restriction   Complete by: As directed    Discharge instructions   Complete by: As directed    Follow with Primary MD  in 1-2 weeks  Please get a complete blood count and chemistry panel checked by your Primary MD at your next visit, and again as instructed by your Primary MD.  Get Medicines reviewed and adjusted: Please take all your medications with you for your next visit with your Primary MD  Laboratory/radiological data: Please request your Primary MD to go over all hospital tests and procedure/radiological results at the follow up, please ask your Primary MD to get all Hospital records sent to his/her office.  In some cases, they will be blood work, cultures and biopsy results pending at the time of your discharge. Please request that your primary care M.D. follows up on these results.  Also Note the following: If you experience worsening of your admission symptoms, develop shortness of breath, life threatening emergency, suicidal or homicidal thoughts you must seek medical attention immediately by calling 911 or calling your MD immediately  if symptoms less severe.  You must read complete instructions/literature along with all the possible adverse reactions/side effects for all the Medicines you take and that have been prescribed to you. Take any new Medicines after you have completely understood and accpet all the possible adverse reactions/side effects.   Do not drive when  taking Pain medications or sleeping medications (Benzodaizepines)  Do not take more than prescribed Pain, Sleep and Anxiety Medications. It is not advisable to combine anxiety,sleep and pain medications without talking with your primary care practitioner  Special Instructions: If you have smoked or chewed Tobacco  in the last 2 yrs please stop smoking, stop any regular Alcohol  and or any Recreational drug use.  Wear Seat belts while driving.  Please note: You were cared for by a hospitalist during your hospital stay. Once you are discharged, your primary care physician will handle any further medical issues. Please note that NO REFILLS for any discharge medications will be authorized once you are discharged, as it is imperative that you return to your primary care physician (or establish a relationship with a primary care physician if you do not have one) for your post hospital discharge needs so that they can reassess your need for medications and monitor your lab values.   Check CBGs before meals and at bedtime   Discharge wound care:   Complete by: As directed    Wound care  Daily      Comments: Cleanse L foot/heel with soap and water, dry and paint black eschar with Betadine  daily, allow to air dry.  Cover with silicone foam heel protectors.    Wound care  Every shift      Comments: Twice daily moist-to-dry dressing changes to left thigh, and as needed if contaminated with  stool.   Increase activity slowly   Complete by: As directed       Allergies as of 02/25/2024       Reactions   Gabapentin  Hives, [name]   Isovue  [iopamidol ] Anaphylaxis, [name], Other (See Comments)   11/28/17 Patient had seizure like activity and then 1 min code after 100 cc of isovue  300. Possible contrast allergy vs vasovagal episode Cardiac Arrest   Nsaids Anaphylaxis, Other (See Comments)   Hx of stomach ulcers   Penicillins Itching, Palpitations, Other (See Comments)   Flushing  (Red Skin) Laryngeal Edema   Reglan  [metoclopramide ] Other (See Comments)   Tardive dyskinesia   Valium  [diazepam ] [name]   Zestril [lisinopril] Anaphylaxis, Swelling   Tongue and mouth swelling Laryngeal Edema   Tolectin [tolmetin] Nausea And Vomiting, Nausea Only, Other (See Comments)   Irritates stomach ulcer   Asa [aspirin ] Other (See Comments)   Hx of stomach ulcer   Aspartame And Phenylalanine Hives   Bentyl  [dicyclomine ] Other (See Comments)   Chest pain   Hibiclens  [chlorhexidine  Gluconate] Other (See Comments)   Dermatitis    Flexeril  [cyclobenzaprine ] Palpitations   Oxycontin  [oxycodone ] Palpitations   Rifamycins Palpitations   Tylenol  [acetaminophen ] Nausea And Vomiting, Nausea Only, Other (See Comments)   Irritates stomach ulcer Abdominal pain   Ultram  [tramadol ] Nausea And Vomiting, Palpitations        Medication List     TAKE these medications    (feeding supplement) PROSource Plus liquid Take 30 mLs by mouth 2 (two) times daily between meals.   albuterol  (2.5 MG/3ML) 0.083% nebulizer solution Commonly known as: PROVENTIL  Take 3 mLs (2.5 mg total) by nebulization every 6 (six) hours as needed for wheezing or shortness of breath.   allopurinol  100 MG tablet Commonly known as: ZYLOPRIM  Take 1 tablet (100 mg total) by mouth 2 (two) times daily.   ascorbic acid  500 MG tablet Commonly known as: VITAMIN C  Take 500 mg by mouth daily.   atorvastatin  10 MG tablet Commonly known as: LIPITOR Take 10 mg by mouth every evening.   b complex-vitamin c -folic acid  0.8 MG Tabs tablet Take 1 tablet by mouth at bedtime.   busPIRone  5 MG tablet Commonly known as: BUSPAR  Take 1 tablet (5 mg total) by mouth 3 (three) times daily.   calcium  carbonate 500 MG chewable tablet Commonly known as: TUMS - dosed in mg elemental calcium  Chew 1 tablet (200 mg of elemental calcium  total) by mouth 2 (two) times daily as needed for indigestion or heartburn.    ceFAZolin  2-4 GM/100ML-% IVPB Commonly known as: ANCEF  Inject 100 mLs (2 g total) into the vein Every Tuesday,Thursday,and Saturday with dialysis for 2 days. Start taking on: February 28, 2024   cetirizine  10 MG tablet Commonly known as: ZYRTEC  Take 10 mg by mouth daily.   cholestyramine  light 4 g packet Commonly known as: PREVALITE  Take 1 packet (4 g total) by mouth 2 (two) times daily.   diclofenac  Sodium 1 % Gel Commonly known as: VOLTAREN  Apply 2 g topically 4 (four) times daily.   famotidine  20 MG tablet Commonly known as: PEPCID  Take 20 mg by mouth daily before breakfast.   ferric citrate  1 GM 210 MG(Fe) tablet Commonly known as: AURYXIA  Take 2 tablets (420 mg total) by mouth 3 (three) times daily with meals.   fluticasone  50 MCG/ACT nasal spray Commonly known as: FLONASE  Place 2 sprays into both nostrils daily as needed for allergies or rhinitis.  folic acid  1 MG tablet Commonly known as: FOLVITE  Take 1 tablet (1 mg total) by mouth daily.   Gerhardt's butt cream Crea Apply 1 Application topically 3 (three) times daily.   hydrocerin Crea Apply 1 Application topically 2 (two) times daily. Apply to L leg and foot 2 times daily   HYDROcodone -acetaminophen  5-325 MG tablet Commonly known as: NORCO/VICODIN Take 1-2 tablets by mouth every 6 (six) hours as needed for moderate pain (pain score 4-6).   hydrocortisone  cream 1 % Apply 1 Application topically 2 (two) times daily.   HYDROmorphone  2 MG tablet Commonly known as: DILAUDID  Take 1 tablet (2 mg total) by mouth every 6 (six) hours as needed for severe pain (pain score 7-10). What changed:  medication strength how much to take   hydrOXYzine  25 MG tablet Commonly known as: ATARAX  Take 1 tablet (25 mg total) by mouth every 4 (four) hours as needed for anxiety.   hyoscyamine  0.125 MG SL tablet Commonly known as: LEVSIN  SL Place 1 tablet (0.125 mg total) under the tongue 3 (three) times daily.   insulin   aspart 100 UNIT/ML injection Commonly known as: novoLOG  Inject 0-6 Units into the skin 3 (three) times daily with meals. What changed: how much to take   loperamide  2 MG capsule Commonly known as: IMODIUM  Take 1 capsule (2 mg total) by mouth every 6 (six) hours.   methocarbamol  500 MG tablet Commonly known as: ROBAXIN  Take 1 tablet (500 mg total) by mouth every 6 (six) hours as needed for muscle spasms.   metroNIDAZOLE  500 MG tablet Commonly known as: FLAGYL  Take 1 tablet (500 mg total) by mouth every 12 (twelve) hours for 2 days.   midodrine  10 MG tablet Commonly known as: PROAMATINE  Take 1 tablet (10 mg total) by mouth every Monday, Wednesday, and Friday with hemodialysis. What changed: when to take this   mirtazapine  15 MG tablet Commonly known as: REMERON  Take 15 mg by mouth at bedtime.   ondansetron  4 MG disintegrating tablet Commonly known as: ZOFRAN -ODT Take 1 tablet (4 mg total) by mouth every 8 (eight) hours as needed for nausea or vomiting.   pantoprazole  40 MG tablet Commonly known as: PROTONIX  Take 1 tablet (40 mg total) by mouth 2 (two) times daily. What changed: when to take this   promethazine  25 MG tablet Commonly known as: PHENERGAN  Take 1 tablet (25 mg total) by mouth 4 (four) times daily -  before meals and at bedtime.   risperiDONE  1 MG disintegrating tablet Commonly known as: RISPERDAL  M-TABS Take 1 tablet (1 mg total) by mouth at bedtime.   saccharomyces boulardii 250 MG capsule Commonly known as: FLORASTOR Take 250 mg by mouth in the morning, at noon, and at bedtime.   traZODone  50 MG tablet Commonly known as: DESYREL  Take 1 tablet (50 mg total) by mouth at bedtime.   vancomycin  1-5 GM/200ML-% Soln Commonly known as: VANCOCIN  Inject 200 mLs (1,000 mg total) into the vein Every Tuesday,Thursday,and Saturday with dialysis for 2 days.               Discharge Care Instructions  (From admission, onward)           Start      Ordered   02/25/24 0000  Discharge wound care:       Comments: Wound care  Daily      Comments: Cleanse L foot/heel with soap and water, dry and paint black eschar with Betadine  daily, allow to air dry.  Cover with  silicone foam heel protectors.    Wound care  Every shift      Comments: Twice daily moist-to-dry dressing changes to left thigh, and as needed if contaminated with stool.   02/25/24 1341            Follow-up Information     [facility]. Call.   Specialty: General Surgery Why: As needed Contact information: [address] [place] [address] [phone]                Allergies[1]   Other Procedures/Studies: CT HIP LEFT WO CONTRAST Result Date: 02/25/2024 EXAM: CT OF THE LEFT HIP WITHOUT IV CONTRAST 02/24/2024 11:32:00 AM TECHNIQUE: CT of the left hip was performed without the administration of intravenous contrast. Multiplanar reformatted images are provided for review. Automated exposure control, iterative reconstruction, and/or weight based adjustment of the mA/kV was utilized to reduce the radiation dose to as low as reasonably achievable. COMPARISON: CT Left Hip Without IV Contrast 02/16/2024. CLINICAL HISTORY: Suspected soft tissue infection on hip, status post incision and drainage of left upper thigh, worsening pain with abscess and leukocytosis. FINDINGS: BONES: No acute fracture or dislocation. No aggressive appearing osseous abnormality or periostitis. No focal bone erosions or osteomyelitis identified. SOFT TISSUE: Skin thickening and diffuse subcutaneous soft tissue stranding are identified, involving the proximal portion of the left lower extremity. This is most severe along the medial aspect of the thigh region. Changes from interval incision and drainage of large abscess within the anterior and medial aspect of the left inguinal region and left thigh. Large open wound at the I\T\D site is identified without new or residual  fluid. Scattered foci of gas within the anterior soft tissues of the proximal anterior and medial left thigh are noted, decreased from the previous exam. Extensive vascular calcifications identified. Small reactive left inguinal lymph nodes. JOINT: Mild degenerative changes are identified involving the left hip. No osseous erosions. There are no signs of a joint effusion. INTRAPELVIC CONTENTS: Left fundal subserosal fibroid extending into the left adnexa is unchanged. IMPRESSION: 1. Post-procedural changes from interval incision and drainage of large abscess within the anterior and medial aspect of the left inguinal region and left thigh, with a large open wound at the incision and drainage site without new or residual fluid. 2. Skin thickening and diffuse subcutaneous soft tissue stranding involving the proximal portion of the left lower extremity, most severe along the medial aspect of the thigh region. 3. Scattered foci of gas within the anterior soft tissues of the proximal anterior and medial left thigh, decreased from the previous exam. 4. No acute osseous abnormality. Electronically signed by: Waddell Calk MD 02/25/2024 11:50 AM EST RP Workstation: [id number]   EEG adult Result Date: 02/19/2024 Shelton Arlin KIDD, MD     02/19/2024 11:13 AM Patient Name: AELLA RONDA MRN: [id number] Epilepsy Attending: Arlin KIDD Shelton Referring Physician/Provider: Sherlon Brayton RAMAN, MD Date: 02/19/2024 Duration: 23.54 mins Patient history: 58yo F with ams. EEG to evaluate for seizure Level of alertness: Awake AEDs during EEG study: None Technical aspects: This EEG study was done with scalp electrodes positioned according to the 10-20 International system of electrode placement. Electrical activity was reviewed with band pass filter of 1-70Hz , sensitivity of 7 uV/mm, display speed of 80mm/sec with a 60Hz  notched filter applied as appropriate. EEG data were recorded continuously and digitally stored.  Video  monitoring was available and reviewed as appropriate. Description: EEG showed continuous generalized polymorphic sharply contoured 3  to 6 Hz theta-delta slowing. Hyperventilation and photic stimulation were not performed.   ABNORMALITY - Continuous slow, generalized IMPRESSION: This study is suggestive of generalized cerebral dysfunction ( encephalopathy). No seizures or epileptiform discharges were seen throughout the recording. [name]   VAS US  LOWER EXTREMITY VENOUS (DVT) Result Date: 02/18/2024  Lower Venous DVT Study Patient Name:  DAWNISHA MARQUINA  Date of Exam:   02/17/2024 Medical Rec #: [id number]             Accession #:    [id number] Date of Birth: 1965-04-14             Patient Gender: F Patient Age:   51 years Exam Location:  [facility] Procedure:      VAS US  LOWER EXTREMITY VENOUS (DVT) Referring Phys: ANAND HONGALGI --------------------------------------------------------------------------------  Indications: Swelling, and Edema.  Limitations: Bandages, open wound and post-op. Comparison Study: Previous study of the bilateral lower extremities on                   1.25.2023. Performing Technologist: Edilia Elden Appl  Examination Guidelines: A complete evaluation includes B-mode imaging, spectral Doppler, color Doppler, and power Doppler as needed of all accessible portions of each vessel. Bilateral testing is considered an integral part of a complete examination. Limited examinations for reoccurring indications may be performed as noted. The reflux portion of the exam is performed with the patient in reverse Trendelenburg.  +-----+---------------+---------+-----------+----------+--------------+ RIGHTCompressibilityPhasicitySpontaneityPropertiesThrombus Aging +-----+---------------+---------+-----------+----------+--------------+ CFV  Full           Yes      Yes                                  +-----+---------------+---------+-----------+----------+--------------+ SFJ  Full           Yes      Yes                                 +-----+---------------+---------+-----------+----------+--------------+   +---------+---------------+---------+-----------+----------+-------------------+ LEFT     CompressibilityPhasicitySpontaneityPropertiesThrombus Aging      +---------+---------------+---------+-----------+----------+-------------------+ CFV                                                   Not well visualized +---------+---------------+---------+-----------+----------+-------------------+ SFJ                                                   Not well visualized +---------+---------------+---------+-----------+----------+-------------------+ FV Prox                                               Not well visualized +---------+---------------+---------+-----------+----------+-------------------+ FV Mid                  Yes      Yes                                      +---------+---------------+---------+-----------+----------+-------------------+  FV Distal               Yes      Yes                                      +---------+---------------+---------+-----------+----------+-------------------+ PFV                                                   Not well visualized +---------+---------------+---------+-----------+----------+-------------------+ POP                     Yes      Yes                                      +---------+---------------+---------+-----------+----------+-------------------+ PTV                     Yes      Yes                                      +---------+---------------+---------+-----------+----------+-------------------+ PERO                                                  Not well visualized +---------+---------------+---------+-----------+----------+-------------------+ Patient is post thigh abscess  drainage, unable to compress due to pain. Gross patency of left lower extremity performed. No obvious signs of deep vein thrombosis noted.    Summary: RIGHT: - No evidence of common femoral vein obstruction.   LEFT: - There is no obvious evidence of deep vein thrombosis in the lower extremity. However, portions of this examination were limited- see technologist comments above.  - No cystic structure found in the popliteal fossa.  *See table(s) above for measurements and observations. Electronically signed by Gaile New MD on 02/18/2024 at 10:50:06 AM.    Final    MRI brain without contrast Result Date: 02/17/2024 EXAM: MRI BRAIN WITHOUT CONTRAST 02/17/2024 03:17:30 PM TECHNIQUE: Multiplanar multisequence MRI of the head/brain was performed without the administration of intravenous contrast. COMPARISON: Head CT 02/14/2024 and 12/15/2022. CLINICAL HISTORY: AMS, high concern for stroke or infection. FINDINGS: The examination is moderately motion degraded. BRAIN AND VENTRICLES: There is no evidence of an acute infarct, intracranial hemorrhage, mass, midline shift, hydrocephalus, or extra-axial fluid collection. Patchy T2 hyperintensities in the pons are similar to the prior MRI and nonspecific but compatible with chronic small vessel ischemic disease. No age advanced chronic small vessel ischemia is evident in the supratentorial white matter. Cerebral volume is within normal limits for age. Major intracranial vascular flow voids are preserved. ORBITS: Bilateral cataract extraction. SINUSES AND MASTOIDS: No acute abnormality. BONES AND SOFT TISSUES: Normal marrow signal. No acute soft tissue abnormality. IMPRESSION: 1. No acute intracranial abnormality identified on this motion-degraded examination. 2. Unchanged chronic small vessel ischemia in the pons. Electronically signed by: Dasie Hamburg MD 02/17/2024 03:44 PM EST RP Workstation: [id number]   DG CHEST PORT 1 VIEW Result Date: 02/17/2024 CLINICAL DATA:  Central line placement EXAM: PORTABLE CHEST 1 VIEW COMPARISON:  Three days ago FINDINGS: The heart size and mediastinal contours are within normal limits. Both lungs are clear. The visualized skeletal structures are unremarkable. Stable right internal jugular dialysis catheter. Interval placement of left internal jugular catheter with distal tip in expected position cavoatrial junction. IMPRESSION: Interval placement of left internal jugular catheter with distal tip in expected position of cavoatrial junction. Electronically Signed   By: Lynwood Landy Raddle M.D.   On: 02/17/2024 08:34   CT ABDOMEN PELVIS W CONTRAST Addendum Date: 02/16/2024 ADDENDUM REPORT: 02/16/2024 10:34 ADDENDUM: These results were called by telephone at the time of interpretation on 02/16/2024 at 10:34 am to provider Dr. SHERLON , who verbally acknowledged these results. Electronically Signed   By: Toribio Agreste M.D.   On: 02/16/2024 10:34   Result Date: 02/16/2024 CLINICAL DATA:  Left groin/hip swelling with erythema. Possible sepsis. EXAM: CT ABDOMEN AND PELVIS WITH CONTRAST TECHNIQUE: Multidetector CT imaging of the abdomen and pelvis was performed using the standard protocol following bolus administration of intravenous contrast. RADIATION DOSE REDUCTION: This exam was performed according to the departmental dose-optimization program which includes automated exposure control, adjustment of the mA and/or kV according to patient size and/or use of iterative reconstruction technique. CONTRAST:  75mL OMNIPAQUE  IOHEXOL  350 MG/ML SOLN COMPARISON:  05/29/2023 FINDINGS: Lower chest: Heart is normal size. Dialysis catheter tip over the right atrium. Visualized lung bases demonstrate mild bibasilar linear density compatible with atelectasis. Hepatobiliary: Previous cholecystectomy. Liver and biliary tree are normal. Pancreas: Normal. Spleen: Normal. Adrenals/Urinary Tract: Adrenal glands are normal. Kidneys are normal in size without  hydronephrosis or nephrolithiasis. Ureters and bladder are normal. Stomach/Bowel: Stomach is unremarkable. Couple small bowel loops over the left abdomen at the upper limits of normal in diameter. Small bowel is otherwise unremarkable. Appendix is normal. Mild diverticulosis of the sigmoid colon. Most of the colon is decompressed. Subtle diffuse wall thickening of the rectum. Vascular/Lymphatic: Minimal calcified plaque over the abdominal aorta which is normal caliber. No adenopathy. Reproductive: No change in 4.4 cm calcified oval mass in the left adnexa likely serosal fibroid. Uterus and adnexal regions are otherwise unchanged. Other: No free peritoneal fluid or focal inflammatory change. No free peritoneal air. Oval fluid collection with significant associated mottled air over the anterior subcutaneous fat of the left proximal thigh measuring 3.3 x 15.5 cm in AP and transverse dimension along its anterior component as this communicates with a medial component extending posteriorly and abutting the skin measuring approximately 2.8 x 10.3 cm in transverse in AP dimension. In keeping with patient's history, this is compatible with soft tissue infection/abscess. Musculoskeletal: Stable grade 1 anterolisthesis of L4 on L5. Moderate spondylosis at the L5-S1 level with disc space narrowing present. IMPRESSION: 1. Oval fluid collection with significant associated mottled air over the anterior subcutaneous fat of the left proximal thigh measuring 3.3 x 15.5 cm in AP and transverse dimension along its anterior component as this communicates with a medial component extending posteriorly and abutting the skin measuring approximately 2.8 x 10.3 cm in transverse in AP dimension. In keeping with patient's history, this is compatible with soft tissue infection/abscess. 2. Subtle diffuse wall thickening of the rectum which may be due to lack of distension versus mild proctitis. 3. Mild sigmoid diverticulosis. 4. Aortic  atherosclerosis. Aortic Atherosclerosis (ICD10-I70.0). These results will be called to the ordering clinician or representative by the Radiologist Assistant, and communication documented in the PACS or [employer]. Electronically Signed:  By: Toribio Agreste M.D. On: 02/16/2024 10:24   CT hip left without contrast Result Date: 02/16/2024 EXAM: CT left Hip, without IV contrast CLINICAL HISTORY: sepsis, left groin and pannus edema TECHNIQUE: Axial images were acquired through the left hip without IV contrast. Reformatted images were reviewed. Dose reduction technique was used including one or more of the following: automated exposure control, adjustment of mA and kV according to patient size, and/or iterative reconstruction. COMPARISON: 05/29/2023 FINDINGS: BONES: No acute fracture or focal osseous lesion. JOINTS: No dislocation. No findings of hip effusion. Moderate degenerative arthropathy of the left hip. SOFT TISSUES: A 26.6 x 2.8 x 6.1 cm subcutaneous abscess is present involving the left anterior upper leg and tracking into the left medial upper thigh region just below the perineum. Multiple loculations of internal gas density observed. Surrounding edema/stranding, cannot completely exclude fasciitis. Drainage and potential surgical management recommended. No significant regional muscular involvement. Left fundal subserosal fibroid extending along the left adnexa. Atheromatous vascular calcifications. Small reactive left inguinal lymph nodes. IMPRESSION: 1. 240 cc subcutaneous abscess of the left anterior upper leg tracking into the left medial upper thigh just below the perineum, with internal gas and surrounding edema/stranding; fasciitis cannot be excluded; no significant regional muscular involvement; drainage and potential surgical management recommended. 2. Moderate degenerative arthropathy of the left hip; no hip effusion. 3. Several additional chronic and incidental findings, including small  reactive left inguinal lymph nodes, a left fundal subserosal fibroid extending along the left adnexa, and atheromatous vascular calcifications, see report body for details. These results will be telephoned to the referring provider or the referring provider's representative by professional radiology assistant [facility]) personnel, with communication documented in the clario dashboard. Electronically signed by: Ryan Salvage MD 02/16/2024 10:02 AM EST RP Workstation: [id number]   DG Chest Port 1 View Result Date: 02/14/2024 EXAM: 1 VIEW(S) XRAY OF THE CHEST 02/14/2024 02:14:22 PM COMPARISON: 05/29/2023 CLINICAL HISTORY: AMS FINDINGS: LINES, TUBES AND DEVICES: Stable tunneled right chest dialysis catheter in place. LUNGS AND PLEURA: Low lung volumes. No focal pulmonary opacity. No pleural effusion. No pneumothorax. HEART AND MEDIASTINUM: No acute abnormality of the cardiac and mediastinal silhouettes. BONES AND SOFT TISSUES: Bilateral shoulder DJD. No acute osseous abnormality. IMPRESSION: 1. No acute findings. 2. Stable tunneled right chest dialysis catheter in place. Electronically signed by: Franky Stanford MD 02/14/2024 03:09 PM EST RP Workstation: [id number]   CT Head Wo Contrast Result Date: 02/14/2024 EXAM: CT HEAD WITHOUT 02/14/2024 02:53:00 PM TECHNIQUE: CT of the head was performed without the administration of intravenous contrast. Automated exposure control, iterative reconstruction, and/or weight based adjustment of the mA/kV was utilized to reduce the radiation dose to as low as reasonably achievable. COMPARISON: 01/01/2023 CLINICAL HISTORY: Mental status change, unknown cause FINDINGS: BRAIN AND VENTRICLES: No acute intracranial hemorrhage. No mass effect or midline shift. No extra-axial fluid collection. No evidence of acute infarct. No hydrocephalus. Atherosclerotic calcifications within cavernous internal carotid arteries. ORBITS: Bilateral lens replacement. No acute abnormality. SINUSES AND  MASTOIDS: No acute abnormality. SOFT TISSUES AND SKULL: No acute skull fracture. No acute soft tissue abnormality. IMPRESSION: 1. No acute intracranial abnormality. Electronically signed by: Franky Stanford MD 02/14/2024 03:09 PM EST RP Workstation: [id number]     TODAY-DAY OF DISCHARGE:  Subjective:   [name] today has no headache,no chest abdominal pain,no new weakness tingling or numbness, feels much better wants to go home today.   Objective:   Blood pressure (!) 97/59, pulse 100, temperature 97.8 F (36.6 C), resp. rate ROLLEN)  23, height 5' 6 (1.676 m), weight 97.2 kg, last menstrual period 10/10/2012, SpO2 100%.  Intake/Output Summary (Last 24 hours) at 02/25/2024 1342 Last data filed at 02/25/2024 1217 Gross per 24 hour  Intake 164.2 ml  Output 1400 ml  Net -1235.8 ml   Filed Weights   02/25/24 0500 02/25/24 0806 02/25/24 1217  Weight: 97.1 kg 97.7 kg 97.2 kg    Exam: Awake Alert, Oriented *3, No new F.N deficits, Normal affect [place].AT,PERRAL Supple Neck,No JVD, No cervical lymphadenopathy appriciated.  Symmetrical Chest wall movement, Good air movement bilaterally, CTAB RRR,No Gallops,Rubs or new Murmurs, No Parasternal Heave +ve B.Sounds, Abd Soft, Non tender, No organomegaly appriciated, No rebound -guarding or rigidity. No Cyanosis, Clubbing or edema, No new Rash or bruise   PERTINENT RADIOLOGIC STUDIES: CT HIP LEFT WO CONTRAST Result Date: 02/25/2024 EXAM: CT OF THE LEFT HIP WITHOUT IV CONTRAST 02/24/2024 11:32:00 AM TECHNIQUE: CT of the left hip was performed without the administration of intravenous contrast. Multiplanar reformatted images are provided for review. Automated exposure control, iterative reconstruction, and/or weight based adjustment of the mA/kV was utilized to reduce the radiation dose to as low as reasonably achievable. COMPARISON: CT Left Hip Without IV Contrast 02/16/2024. CLINICAL HISTORY: Suspected soft tissue infection on hip, status  post incision and drainage of left upper thigh, worsening pain with abscess and leukocytosis. FINDINGS: BONES: No acute fracture or dislocation. No aggressive appearing osseous abnormality or periostitis. No focal bone erosions or osteomyelitis identified. SOFT TISSUE: Skin thickening and diffuse subcutaneous soft tissue stranding are identified, involving the proximal portion of the left lower extremity. This is most severe along the medial aspect of the thigh region. Changes from interval incision and drainage of large abscess within the anterior and medial aspect of the left inguinal region and left thigh. Large open wound at the I\T\D site is identified without new or residual fluid. Scattered foci of gas within the anterior soft tissues of the proximal anterior and medial left thigh are noted, decreased from the previous exam. Extensive vascular calcifications identified. Small reactive left inguinal lymph nodes. JOINT: Mild degenerative changes are identified involving the left hip. No osseous erosions. There are no signs of a joint effusion. INTRAPELVIC CONTENTS: Left fundal subserosal fibroid extending into the left adnexa is unchanged. IMPRESSION: 1. Post-procedural changes from interval incision and drainage of large abscess within the anterior and medial aspect of the left inguinal region and left thigh, with a large open wound at the incision and drainage site without new or residual fluid. 2. Skin thickening and diffuse subcutaneous soft tissue stranding involving the proximal portion of the left lower extremity, most severe along the medial aspect of the thigh region. 3. Scattered foci of gas within the anterior soft tissues of the proximal anterior and medial left thigh, decreased from the previous exam. 4. No acute osseous abnormality. Electronically signed by: Waddell Calk MD 02/25/2024 11:50 AM EST RP Workstation: [id number]     PERTINENT LAB RESULTS: CBC: Recent Labs    02/24/24 0800  02/25/24 0800  WBC 18.0* 16.2*  HGB 7.2* 7.7*  HCT 23.2* 24.6*  PLT 255 306   CMET CMP     Component Value Date/Time   NA 133 (L) 02/23/2024 0840   NA 137 09/20/2019 1530   K 4.8 02/23/2024 0840   CL 100 02/23/2024 0840   CO2 27 02/23/2024 0840   GLUCOSE 157 (H) 02/23/2024 0840   BUN 20 02/23/2024 0840   BUN 18 09/20/2019 1530  CREATININE 3.82 (H) 02/23/2024 0840   CALCIUM  8.4 (L) 02/23/2024 0840   PROT 4.8 (L) 02/15/2024 0836   ALBUMIN  2.3 (L) 02/23/2024 0840   AST 10 (L) 02/15/2024 0836   ALT 8 02/15/2024 0836   ALKPHOS 87 02/15/2024 0836   BILITOT 0.9 02/15/2024 0836   GFR 60.69 09/27/2013 1656   GFRNONAA 13 (L) 02/23/2024 0840    GFR Estimated Creatinine Clearance: 18.6 mL/min (A) (by C-G formula based on SCr of 3.82 mg/dL (H)). No results for input(s): LIPASE, AMYLASE in the last 72 hours. No results for input(s): CKTOTAL, CKMB, CKMBINDEX, TROPONINI in the last 72 hours. Invalid input(s): POCBNP No results for input(s): DDIMER in the last 72 hours. No results for input(s): HGBA1C in the last 72 hours. No results for input(s): CHOL, HDL, LDLCALC, TRIG, CHOLHDL, LDLDIRECT in the last 72 hours. No results for input(s): TSH, T4TOTAL, T3FREE, THYROIDAB in the last 72 hours.  Invalid input(s): FREET3 No results for input(s): VITAMINB12, FOLATE, FERRITIN, TIBC, IRON , RETICCTPCT in the last 72 hours. Coags: No results for input(s): INR in the last 72 hours.  Invalid input(s): PT Microbiology: Recent Results (from the past 240 hours)  Culture, blood (Routine X 2) w Reflex to ID Panel     Status: None   Collection Time: 02/16/24  4:12 AM   Specimen: BLOOD RIGHT HAND  Result Value Ref Range Status   Specimen Description BLOOD RIGHT HAND  Final   Special Requests   Final    BOTTLES DRAWN AEROBIC ONLY Blood Culture results may not be optimal due to an inadequate volume of blood received in culture bottles    Culture   Final    NO GROWTH 5 DAYS Performed at [facility], [address], [place], [place] [address]    Report Status 02/21/2024 FINAL  Final  Culture, blood (Routine X 2) w Reflex to ID Panel     Status: None   Collection Time: 02/16/24  4:14 AM   Specimen: BLOOD RIGHT HAND  Result Value Ref Range Status   Specimen Description BLOOD RIGHT HAND  Final   Special Requests   Final    BOTTLES DRAWN AEROBIC ONLY Blood Culture results may not be optimal due to an inadequate volume of blood received in culture bottles   Culture   Final    NO GROWTH 5 DAYS Performed at [facility], [address], [place], [place] [address]    Report Status 02/21/2024 FINAL  Final  Aerobic/Anaerobic Culture w Gram Stain (surgical/deep wound)     Status: None   Collection Time: 02/16/24  2:30 PM   Specimen: Path fluid; Body Fluid  Result Value Ref Range Status   Specimen Description FLUID  Final   Special Requests LEFT THIGH ABSCESS  Final   Gram Stain   Final    ABUNDANT WBC PRESENT, PREDOMINANTLY PMN RARE GRAM NEGATIVE RODS FEW GRAM POSITIVE RODS RARE GRAM POSITIVE COCCI    Culture   Final    MODERATE PROTEUS MIRABILIS FEW ENTEROCOCCUS AVIUM FEW PREVOTELLA BUCCAE BETA LACTAMASE POSITIVE Performed at [facility], [address], [place], [place] [address]    Report Status 02/20/2024 FINAL  Final   Organism ID, Bacteria PROTEUS MIRABILIS  Final   Organism ID, Bacteria ENTEROCOCCUS AVIUM  Final      Susceptibility   Enterococcus avium - MIC*    AMPICILLIN <=2 SENSITIVE Sensitive     VANCOMYCIN  <=0.5 SENSITIVE Sensitive     GENTAMICIN  SYNERGY SENSITIVE Sensitive     * FEW ENTEROCOCCUS AVIUM   Proteus mirabilis - MIC*    AMPICILLIN <=2 SENSITIVE Sensitive     CEFAZOLIN  (NON-URINE) 2 SENSITIVE Sensitive     CEFEPIME  <=0.12 SENSITIVE Sensitive     ERTAPENEM <=0.12 SENSITIVE Sensitive     CEFTRIAXONE  <=0.25 SENSITIVE Sensitive     CIPROFLOXACIN  <=0.06 SENSITIVE Sensitive      GENTAMICIN <=1 SENSITIVE Sensitive     MEROPENEM  0.5 SENSITIVE Sensitive     TRIMETH/SULFA <=20 SENSITIVE Sensitive     AMPICILLIN/SULBACTAM <=2 SENSITIVE Sensitive     PIP/TAZO Value in next row Sensitive      <=4 SENSITIVEThis is a modified FDA-approved test that has been validated and its performance characteristics determined by the reporting laboratory.  This laboratory is certified under the Clinical Laboratory Improvement Amendments CLIA as qualified to perform high complexity clinical laboratory testing.    * MODERATE PROTEUS MIRABILIS    FURTHER DISCHARGE INSTRUCTIONS:  Get Medicines reviewed and adjusted: Please take all your medications with you for your next visit with your Primary MD  Laboratory/radiological data: Please request your Primary MD to go over all hospital tests and procedure/radiological results at the follow up, please ask your Primary MD to get all Hospital records sent to his/her office.  In some cases, they will be blood work, cultures and biopsy results pending at the time of your discharge. Please request that your primary care M.D. goes through all the records of your hospital data and follows up on these results.  Also Note the following: If you experience worsening of your admission symptoms, develop shortness of breath, life threatening emergency, suicidal or homicidal thoughts you must seek medical attention immediately by calling 911 or calling your MD immediately  if symptoms less severe.  You must read complete instructions/literature along with all the possible adverse reactions/side effects for all the Medicines you take and that have been prescribed to you. Take any new Medicines after you have completely understood and accpet all the possible adverse reactions/side effects.   Do not drive when taking Pain medications or sleeping medications (Benzodaizepines)  Do not take more than prescribed Pain, Sleep and Anxiety Medications. It is not  advisable to combine anxiety,sleep and pain medications without talking with your primary care practitioner  Special Instructions: If you have smoked or chewed Tobacco  in the last 2 yrs please stop smoking, stop any regular Alcohol  and or any Recreational drug use.  Wear Seat belts while driving.  Please note: You were cared for by a hospitalist during your hospital stay. Once you are discharged, your primary care physician will handle any further medical issues. Please note that NO REFILLS for any discharge medications will be authorized once you are discharged, as it is imperative that you return to your primary care physician (or establish a relationship with a primary care physician if you do not have one) for your post hospital discharge needs so that they can reassess your need for medications and monitor your lab values.  Total Time spent coordinating discharge including counseling, education and face to face time equals greater than 30 minutes.  Signed: Donalda Applebaum 02/25/2024 1:42 PM      [1]  Allergies Allergen Reactions   Gabapentin  Hives and [name]   Isovue  [Iopamidol ] Anaphylaxis, [name] and Other (See Comments)    11/28/17 Patient had seizure like activity and then 1 min code after 100 cc of isovue  300. Possible contrast  allergy vs vasovagal episode  Cardiac Arrest   Nsaids Anaphylaxis and Other (See Comments)    Hx of stomach ulcers   Penicillins Itching, Palpitations and Other (See Comments)    Flushing (Red Skin) Laryngeal Edema   Reglan  [Metoclopramide ] Other (See Comments)    Tardive dyskinesia    Valium  [Diazepam ] [name]   Zestril [Lisinopril] Anaphylaxis and Swelling    Tongue and mouth swelling Laryngeal Edema   Tolectin [Tolmetin] Nausea And Vomiting, Nausea Only and Other (See Comments)    Irritates stomach ulcer   Dorethia Cobb ] Other (See Comments)    Hx of stomach ulcer   Aspartame And Phenylalanine Hives    Bentyl  [Dicyclomine ] Other (See Comments)    Chest pain   Hibiclens  [Chlorhexidine  Gluconate] Other (See Comments)    Dermatitis    Flexeril  [Cyclobenzaprine ] Palpitations   Oxycontin  [Oxycodone ] Palpitations   Rifamycins Palpitations   Tylenol  [Acetaminophen ] Nausea And Vomiting, Nausea Only and Other (See Comments)    Irritates stomach ulcer Abdominal pain   Ultram  [Tramadol ] Nausea And Vomiting and Palpitations   "

## 2024-02-25 NOTE — Plan of Care (Signed)

## 2024-02-25 NOTE — Plan of Care (Signed)
  Problem: Coping: Goal: Ability to adjust to condition or change in health will improve Outcome: Progressing   Problem: Fluid Volume: Goal: Ability to maintain a balanced intake and output will improve Outcome: Progressing   Problem: Health Behavior/Discharge Planning: Goal: Ability to identify and utilize available resources and services will improve Outcome: Progressing   Problem: Nutritional: Goal: Maintenance of adequate nutrition will improve Outcome: Progressing   Problem: Skin Integrity: Goal: Risk for impaired skin integrity will decrease Outcome: Progressing   Problem: Tissue Perfusion: Goal: Adequacy of tissue perfusion will improve Outcome: Progressing   

## 2024-02-25 NOTE — Progress Notes (Addendum)
 Received patient in bed to unit.  Alert and oriented.  Informed consent signed and in chart.   TX duration:3.5 Patient SBP low and had to run UF off a lot.   Patient tolerated well.  Transported back to the room  Alert, without acute distress.  Hand-off given to patient's nurse.   Access used: R Chest HD cath Access issues: none  Total UF removed: Medication(s) given: dilaudid , midodrine    02/25/24 1211  Vitals  Temp 97.8 F (36.6 C)  Temp Source Oral  BP (!) 85/52  Pulse Rate 95  Resp 19  Type of Weight Post-Dialysis  Oxygen Therapy  SpO2 100 %  O2 Device Room Air  Patient Activity (if Appropriate) In bed  Pulse Oximetry Type Continuous  Oximetry Probe Site Changed No  During Treatment Monitoring  Blood Flow Rate (mL/min) 0 mL/min  Arterial Pressure (mmHg) 89.29 mmHg  Venous Pressure (mmHg) 37.77 mmHg  TMP (mmHg) 0 mmHg  Ultrafiltration Rate (mL/min) 691 mL/min  Dialysate Flow Rate (mL/min) 299 ml/min  Dialysate Potassium Concentration 2  Dialysate Calcium  Concentration 2.5  Duration of HD Treatment -hour(s) 3.5 hour(s)  Cumulative Fluid Removed (mL) per Treatment  800.12  HD Safety Checks Performed Yes  Intra-Hemodialysis Comments Tolerated well;Tx completed  Dialysis Fluid Bolus Normal Saline  Bolus Amount (mL) 300 mL     Camellia Brasil LPN Kidney Dialysis Unit

## 2024-02-25 NOTE — TOC Transition Note (Addendum)
 Transition of Care [facility]) - Discharge Note   Patient Details  Name: Deborah [name] MRN: [id number] Date of Birth: 05-Oct-1965  [facility]) CM/SW Contact:  Ermalinda Penton [place], [place] Phone Number: 02/25/2024, 1:48 PM   Clinical Narrative:    Patient to discharge today to [facility]. Patient to be transported by [facility]. Patient to go to room 213A. RN to please call report to [phone]. Patient informed of discharge back to [facility]. Confirmed that patient has her black jacket in her room that was requested for transport home.  3:59pm [facility] contacted, scheduled pick up for 5pm to allow time for patient to complete antibiotics and have central line removed.  Maxene [place]ington, LCSW [place]  [facility] Licensed Clinical Social Worker  Direct Dial : [phone]        Barriers to Discharge: Continued Medical Work up   Patient Goals and CMS Choice Patient states their goals for this hospitalization and ongoing recovery are:: Return to SNF          Discharge Placement                       Discharge Plan and Services Additional resources added to the After Visit Summary for   In-house Referral: Clinical Social Work   Post Acute Care Choice: Skilled Nursing Facility                               Social Drivers of Health (SDOH) Interventions SDOH Screenings   Food Insecurity: Patient Unable To Answer (02/18/2024)  Housing: Patient Declined (02/18/2024)  Transportation Needs: Patient Unable To Answer (02/18/2024)  Utilities: Not At Risk (02/20/2024)  Recent Concern: Utilities - At Risk (02/18/2024)  Depression (PHQ2-9): Medium Risk (03/16/2023)  Financial Resource Strain: Low Risk (03/23/2021)   Received from [facility])  Social Connections: Low Risk (03/23/2021)   Received from [facility])  Stress: Low Risk (03/23/2021)   Received from [facility])  Tobacco Use: Low Risk (02/16/2024)     Readmission Risk Interventions    02/20/2024   11:05 AM 03/07/2023    4:19 PM 11/25/2022    5:16 PM  Readmission Risk Prevention Plan  Transportation Screening Complete Complete Complete  Medication Review (RN Care Manager) Complete Complete Complete  PCP or Specialist appointment within 3-5 days of discharge Complete Complete Complete  HRI or Home Care Consult Complete Complete Complete  SW Recovery Care/Counseling Consult Complete Complete Complete  Palliative Care Screening Not Applicable Complete Not Applicable  Skilled Nursing Facility Complete Not Applicable Not Applicable

## 2024-02-26 NOTE — Discharge Planning (Addendum)
 [facility] Patient Discharge Orders- [name] CLINIC: Nichole Gentle  Patient's name: Deborah [name] Admit/DC Dates: 02/14/2024 - 02/25/2024  Discharge Diagnoses: Sepsis - 2nd L thigh abscess (see below on ABX plan)   Acute metabolic encephalopathy - 2nd to above  Aranesp : Given: Yes    Date and amount of last dose: 200mcg given on 12/20     Last Hgb: 7.7 PRBC's Given: Yes  Date/# of units: 1 unit PRBCs given on 12/12 ESA dose for discharge: Mircera 225 mcg IV q 2 weeks  IV Iron  dose at discharge: N/A  Heparin  change: No  EDW Change: No  Bath Change: No  Access intervention/Change: No  Hectorol  change: No  Discharge Labs: Calcium  8.4  Phosphorus 3.3  Albumin  2.3  K+ 4.8  IV Antibiotics: Yes Details: L thigh abscess: Cefazolin  2GM IV with HD and Vancomycin  1GM with HD both for 2 HD treatments.  On Coumadin?: No   OTHER/APPTS/LAB ORDERS:    D/C Meds to be reconciled by nurse after every discharge.  Completed By: [name], NP   Reviewed by: MD:______ RN_______

## 2024-02-27 NOTE — Progress Notes (Signed)
 Late Note Entry- Feb 27, 2024  Pt d/c back to snf on Sat. Contacted [facility] this morning to be advised of pt's d/c date. Pt should resume care today due to clinic holiday schedule. Renal NP completed orders for iv abx with HD at d/c.   Randine Mungo Dialysis Navigator [phone]

## 2024-03-16 ENCOUNTER — Inpatient Hospital Stay (HOSPITAL_COMMUNITY)
Admission: EM | Admit: 2024-03-16 | Source: Home / Self Care | Attending: Internal Medicine | Admitting: Internal Medicine

## 2024-03-16 ENCOUNTER — Emergency Department (HOSPITAL_COMMUNITY)

## 2024-03-16 ENCOUNTER — Inpatient Hospital Stay (HOSPITAL_COMMUNITY)

## 2024-03-16 DIAGNOSIS — L89159 Pressure ulcer of sacral region, unspecified stage: Secondary | ICD-10-CM | POA: Diagnosis not present

## 2024-03-16 DIAGNOSIS — R55 Syncope and collapse: Secondary | ICD-10-CM | POA: Diagnosis present

## 2024-03-16 DIAGNOSIS — G9341 Metabolic encephalopathy: Secondary | ICD-10-CM | POA: Diagnosis not present

## 2024-03-16 DIAGNOSIS — A419 Sepsis, unspecified organism: Secondary | ICD-10-CM

## 2024-03-16 DIAGNOSIS — L304 Erythema intertrigo: Secondary | ICD-10-CM

## 2024-03-16 DIAGNOSIS — L26 Exfoliative dermatitis: Secondary | ICD-10-CM

## 2024-03-16 DIAGNOSIS — R579 Shock, unspecified: Secondary | ICD-10-CM | POA: Diagnosis not present

## 2024-03-16 DIAGNOSIS — G9340 Encephalopathy, unspecified: Secondary | ICD-10-CM

## 2024-03-16 DIAGNOSIS — R509 Fever, unspecified: Secondary | ICD-10-CM

## 2024-03-16 DIAGNOSIS — Z992 Dependence on renal dialysis: Secondary | ICD-10-CM | POA: Diagnosis not present

## 2024-03-16 DIAGNOSIS — I959 Hypotension, unspecified: Principal | ICD-10-CM | POA: Diagnosis present

## 2024-03-16 DIAGNOSIS — I480 Paroxysmal atrial fibrillation: Secondary | ICD-10-CM | POA: Diagnosis not present

## 2024-03-16 DIAGNOSIS — R7881 Bacteremia: Secondary | ICD-10-CM

## 2024-03-16 DIAGNOSIS — D631 Anemia in chronic kidney disease: Secondary | ICD-10-CM | POA: Diagnosis not present

## 2024-03-16 DIAGNOSIS — E11649 Type 2 diabetes mellitus with hypoglycemia without coma: Secondary | ICD-10-CM

## 2024-03-16 DIAGNOSIS — S31104A Unspecified open wound of abdominal wall, left lower quadrant without penetration into peritoneal cavity, initial encounter: Secondary | ICD-10-CM

## 2024-03-16 DIAGNOSIS — Z1612 Extended spectrum beta lactamase (ESBL) resistance: Secondary | ICD-10-CM

## 2024-03-16 DIAGNOSIS — D72829 Elevated white blood cell count, unspecified: Secondary | ICD-10-CM

## 2024-03-16 DIAGNOSIS — E872 Acidosis, unspecified: Secondary | ICD-10-CM

## 2024-03-16 DIAGNOSIS — N186 End stage renal disease: Secondary | ICD-10-CM

## 2024-03-16 DIAGNOSIS — R21 Rash and other nonspecific skin eruption: Secondary | ICD-10-CM

## 2024-03-16 DIAGNOSIS — A4159 Other Gram-negative sepsis: Secondary | ICD-10-CM

## 2024-03-16 DIAGNOSIS — N179 Acute kidney failure, unspecified: Secondary | ICD-10-CM

## 2024-03-16 DIAGNOSIS — A499 Bacterial infection, unspecified: Secondary | ICD-10-CM

## 2024-03-16 DIAGNOSIS — B961 Klebsiella pneumoniae [K. pneumoniae] as the cause of diseases classified elsewhere: Secondary | ICD-10-CM

## 2024-03-16 DIAGNOSIS — I4891 Unspecified atrial fibrillation: Secondary | ICD-10-CM | POA: Diagnosis present

## 2024-03-16 DIAGNOSIS — T827XXA Infection and inflammatory reaction due to other cardiac and vascular devices, implants and grafts, initial encounter: Secondary | ICD-10-CM

## 2024-03-16 LAB — COMPREHENSIVE METABOLIC PANEL WITH GFR
ALT: <5 U/L (ref 0–44)
AST: 35 U/L (ref 15–41)
Albumin: 1.9 g/dL — ABNORMAL LOW (ref 3.5–5.0)
Alkaline Phosphatase: 93 U/L (ref 38–126)
Anion gap: 25 — ABNORMAL HIGH (ref 5–15)
BUN: 23 mg/dL — ABNORMAL HIGH (ref 6–20)
CO2: 14 mmol/L — ABNORMAL LOW (ref 22–32)
Calcium: 7.8 mg/dL — ABNORMAL LOW (ref 8.9–10.3)
Chloride: 97 mmol/L — ABNORMAL LOW (ref 98–111)
Creatinine, Ser: 7.04 mg/dL — ABNORMAL HIGH (ref 0.44–1.00)
GFR, Estimated: 6 mL/min — ABNORMAL LOW
Glucose, Bld: 75 mg/dL (ref 70–99)
Potassium: 4.2 mmol/L (ref 3.5–5.1)
Sodium: 135 mmol/L (ref 135–145)
Total Bilirubin: 0.4 mg/dL (ref 0.0–1.2)
Total Protein: 5 g/dL — ABNORMAL LOW (ref 6.5–8.1)

## 2024-03-16 LAB — CBC WITH DIFFERENTIAL/PLATELET
Abs Immature Granulocytes: 0.05 K/uL (ref 0.00–0.07)
Basophils Absolute: 0 K/uL (ref 0.0–0.1)
Basophils Relative: 0 %
Eosinophils Absolute: 0 K/uL (ref 0.0–0.5)
Eosinophils Relative: 0 %
HCT: 23.3 % — ABNORMAL LOW (ref 36.0–46.0)
Hemoglobin: 7.6 g/dL — ABNORMAL LOW (ref 12.0–15.0)
Immature Granulocytes: 0 %
Lymphocytes Relative: 8 %
Lymphs Abs: 1 K/uL (ref 0.7–4.0)
MCH: 31.4 pg (ref 26.0–34.0)
MCHC: 32.6 g/dL (ref 30.0–36.0)
MCV: 96.3 fL (ref 80.0–100.0)
Monocytes Absolute: 0.5 K/uL (ref 0.1–1.0)
Monocytes Relative: 4 %
Neutro Abs: 10.1 K/uL — ABNORMAL HIGH (ref 1.7–7.7)
Neutrophils Relative %: 88 %
Platelets: 145 K/uL — ABNORMAL LOW (ref 150–400)
RBC: 2.42 MIL/uL — ABNORMAL LOW (ref 3.87–5.11)
RDW: 22.6 % — ABNORMAL HIGH (ref 11.5–15.5)
Smear Review: NORMAL
WBC: 11.6 K/uL — ABNORMAL HIGH (ref 4.0–10.5)
nRBC: 3 % — ABNORMAL HIGH (ref 0.0–0.2)

## 2024-03-16 LAB — MAGNESIUM: Magnesium: 2 mg/dL (ref 1.7–2.4)

## 2024-03-16 LAB — I-STAT CG4 LACTIC ACID, ED: Lactic Acid, Venous: 9 mmol/L (ref 0.5–1.9)

## 2024-03-16 LAB — PHOSPHORUS: Phosphorus: 6.3 mg/dL — ABNORMAL HIGH (ref 2.5–4.6)

## 2024-03-16 LAB — I-STAT VENOUS BLOOD GAS, ED
Acid-base deficit: 12 mmol/L — ABNORMAL HIGH (ref 0.0–2.0)
Bicarbonate: 14.7 mmol/L — ABNORMAL LOW (ref 20.0–28.0)
Calcium, Ion: 1 mmol/L — ABNORMAL LOW (ref 1.15–1.40)
HCT: 26 % — ABNORMAL LOW (ref 36.0–46.0)
Hemoglobin: 8.8 g/dL — ABNORMAL LOW (ref 12.0–15.0)
O2 Saturation: 77 %
Potassium: 4.1 mmol/L (ref 3.5–5.1)
Sodium: 136 mmol/L (ref 135–145)
TCO2: 16 mmol/L — ABNORMAL LOW (ref 22–32)
pCO2, Ven: 35 mmHg — ABNORMAL LOW (ref 44–60)
pH, Ven: 7.233 — ABNORMAL LOW (ref 7.25–7.43)
pO2, Ven: 49 mmHg — ABNORMAL HIGH (ref 32–45)

## 2024-03-16 LAB — TROPONIN T, HIGH SENSITIVITY
Troponin T High Sensitivity: 105 ng/L (ref 0–19)
Troponin T High Sensitivity: 95 ng/L — ABNORMAL HIGH (ref 0–19)

## 2024-03-16 LAB — CBG MONITORING, ED
Glucose-Capillary: 57 mg/dL — ABNORMAL LOW (ref 70–99)
Glucose-Capillary: 92 mg/dL (ref 70–99)

## 2024-03-16 MED ORDER — LACTATED RINGERS IV BOLUS
1000.0000 mL | Freq: Once | INTRAVENOUS | Status: AC
  Administered 2024-03-16: 1000 mL via INTRAVENOUS

## 2024-03-16 MED ORDER — NOREPINEPHRINE 4 MG/250ML-% IV SOLN
0.0000 ug/min | INTRAVENOUS | Status: DC
  Administered 2024-03-16: 40 ug/min via INTRAVENOUS
  Administered 2024-03-16: 4 ug/min via INTRAVENOUS
  Filled 2024-03-16 (×3): qty 250

## 2024-03-16 MED ORDER — ALBUMIN HUMAN 5 % IV SOLN
25.0000 g | Freq: Once | INTRAVENOUS | Status: AC
  Administered 2024-03-17: 25 g via INTRAVENOUS
  Filled 2024-03-16: qty 500

## 2024-03-16 MED ORDER — PIPERACILLIN-TAZOBACTAM 4.5 G IVPB: 4.5000 g | Freq: Once | INTRAVENOUS | Status: DC

## 2024-03-16 MED ORDER — VANCOMYCIN HCL 2000 MG/400ML IV SOLN
2000.0000 mg | Freq: Once | INTRAVENOUS | Status: AC
  Administered 2024-03-16: 2000 mg via INTRAVENOUS
  Filled 2024-03-16 (×2): qty 400

## 2024-03-16 MED ORDER — SODIUM BICARBONATE 8.4 % IV SOLN
50.0000 meq | Freq: Once | INTRAVENOUS | Status: AC
  Administered 2024-03-16: 50 meq via INTRAVENOUS
  Filled 2024-03-16: qty 50

## 2024-03-16 MED ORDER — SENNA 8.6 MG PO TABS: 1.0000 | ORAL_TABLET | Freq: Two times a day (BID) | ORAL | Status: AC | PRN

## 2024-03-16 MED ORDER — DEXTROSE 50 % IV SOLN
25.0000 mL | Freq: Once | INTRAVENOUS | Status: AC
  Administered 2024-03-16: 50 mL via INTRAVENOUS
  Filled 2024-03-16: qty 50

## 2024-03-16 MED ORDER — VANCOMYCIN VARIABLE DOSE PER UNSTABLE RENAL FUNCTION (PHARMACIST DOSING): Status: DC

## 2024-03-16 MED ORDER — CALCIUM GLUCONATE-NACL 2-0.675 GM/100ML-% IV SOLN
2.0000 g | Freq: Once | INTRAVENOUS | Status: AC
  Administered 2024-03-16: 2000 mg via INTRAVENOUS
  Filled 2024-03-16: qty 100

## 2024-03-16 MED ORDER — VASOPRESSIN 20 UNITS/100 ML INFUSION FOR SHOCK
0.0000 [IU]/min | INTRAVENOUS | Status: DC
  Administered 2024-03-16: 0.03 [IU]/min via INTRAVENOUS
  Administered 2024-03-17 – 2024-03-18 (×5): 0.04 [IU]/min via INTRAVENOUS
  Administered 2024-03-19: 0.02 [IU]/min via INTRAVENOUS
  Filled 2024-03-16 (×8): qty 100

## 2024-03-16 MED ORDER — SODIUM CHLORIDE 0.9 % IV SOLN
2.0000 g | Freq: Once | INTRAVENOUS | Status: AC
  Administered 2024-03-16: 2 g via INTRAVENOUS
  Filled 2024-03-16: qty 12.5

## 2024-03-16 MED ORDER — POLYETHYLENE GLYCOL 3350 17 G PO PACK: 17.0000 g | PACK | Freq: Every day | ORAL | Status: AC | PRN

## 2024-03-16 NOTE — Procedures (Signed)
 Central Venous Catheter Insertion Procedure Note  Deborah [name]  [id number]  [birth year]  Date:03/16/2024  Time:11:46 PM   Provider Performing:Kateleen Encarnacion H Mckinzi Eriksen   Procedure: Insertion of Non-tunneled Central Venous Catheter(36556) with US  guidance (23062)   Indication(s) Medication administration and Difficult access; Prior RIJ CVC malpositioned on imaging. Removed at bedside prior to obtaining femoral access.  Consent Risks of the procedure as well as the alternatives and risks of each were explained to the patient and/or caregiver.  Consent for the procedure was obtained and is signed in the bedside chart  Anesthesia Topical only with 1% lidocaine    Timeout Verified patient identification, verified procedure, site/side was marked, verified correct patient position, special equipment/implants available, medications/allergies/relevant history reviewed, required imaging and test results available.  Sterile Technique Maximal sterile technique including full sterile barrier drape, hand hygiene, sterile gown, sterile gloves, mask, hair covering, sterile ultrasound probe cover (if used).  Procedure Description Area of catheter insertion was cleaned with chlorhexidine  and draped in sterile fashion.  With real-time ultrasound guidance a central venous catheter was placed into the right femoral vein. Nonpulsatile blood flow and easy flushing noted in all ports.  The catheter was sutured in place and sterile dressing applied.  Complications/Tolerance None; patient tolerated the procedure well. Chest x-ray is not ordered for femoral cannulation.  EBL Minimal  Specimen(s) None  Warren Shade, DNP, AGACNP-BC [facility]  Please see [email or web address] for pager details.  From 7A-7P if no response, please call [phone]. After hours, please call ELink [phone].

## 2024-03-16 NOTE — H&P (Incomplete)
 "  NAME:  Deborah [name], MRN:  [id number], DOB:  28-Jan-1966, LOS: 0 ADMISSION DATE:  03/16/2024, CONSULTATION DATE:  *** REFERRING MD:  ***, CHIEF COMPLAINT:  ***   History of Present Illness:  Deborah [name] is a 59 yo female with past medical history significant for PAF, HLD, right BKA, ESRD on HD (TTS) who presented to [facility] after syncopal episode with +LOC. Patient hemodynamically unstable in ED despite fluid resuscitation with 3L crystalloid and was subsequently started on Norepinephrine  gtt. Labs notable for metabolic acidosis, sCr 7.04, BUN 23, iCa 1.00, troponin 105, lactic acic 9.0, given 1 amp HCO3. On exam patient with diffuse abdominal pain, exam somewhat limited as she has chronic pain managed daily with Dilaudid . Patient started empirically on Cefepime , Vanc, Flagyl ; CT A/P pending, and syncope workup in progress.  Pertinent Medical History:  ***  Significant Hospital Events: Including procedures, antibiotic start and stop dates in addition to other pertinent events     Interim History / Subjective:  ***  Objective    Blood pressure (!) 103/50, pulse (!) 111, temperature (!) 97.4 F (36.3 C), temperature source Oral, resp. rate 16, last menstrual period 10/10/2012, SpO2 100%.       No intake or output data in the 24 hours ending 03/16/24 2140 There were no vitals filed for this visit.  Examination: General: *** HENT: *** Lungs: *** Cardiovascular: *** Abdomen: *** Extremities: *** Neuro: *** GU: ***  Resolved Problem List:   Assessment and Plan:   Undifferentiated shock; likely septic; presented tachycardic, hypotensive, with lactic acidosis; unresponsive to fluids now on vasopressor support; source unclear; ?intraabdominal ?urinary -Received 3L crystalloid in ED -Started on norepinephrine ; add vasopressin  if unresponsive and MAP <65 -Blood cultures pending; UA pending -Empirically started on Cefepime /Vanc/Flagyl   Labs:  CBC: Recent Labs  Lab  03/16/24 1711 03/16/24 1754  WBC 11.6*  --   NEUTROABS 10.1*  --   HGB 7.6* 8.8*  HCT 23.3* 26.0*  MCV 96.3  --   PLT 145*  --     Basic Metabolic Panel: Recent Labs  Lab 03/16/24 1711 03/16/24 1754  NA 135 136  K 4.2 4.1  CL 97*  --   CO2 14*  --   GLUCOSE 75  --   BUN 23*  --   CREATININE 7.04*  --   CALCIUM  7.8*  --   MG 2.0  --   PHOS 6.3*  --    GFR: CrCl cannot be calculated (Unknown ideal weight.). Recent Labs  Lab 03/16/24 1711 03/16/24 2018  WBC 11.6*  --   LATICACIDVEN  --  9.0*    Liver Function Tests: Recent Labs  Lab 03/16/24 1711  AST 35  ALT <5  ALKPHOS 93  BILITOT 0.4  PROT 5.0*  ALBUMIN  1.9*   No results for input(s): LIPASE, AMYLASE in the last 168 hours. No results for input(s): AMMONIA in the last 168 hours.  ABG    Component Value Date/Time   PHART 7.534 (H) 02/17/2024 1415   PCO2ART 29.6 (L) 02/17/2024 1415   PO2ART 160 (H) 02/17/2024 1415   HCO3 14.7 (L) 03/16/2024 1754   TCO2 16 (L) 03/16/2024 1754   ACIDBASEDEF 12.0 (H) 03/16/2024 1754   O2SAT 77 03/16/2024 1754     Coagulation Profile: No results for input(s): INR, PROTIME in the last 168 hours.  Cardiac Enzymes: No results for input(s): CKTOTAL, CKMB, CKMBINDEX, TROPONINI in the last 168 hours.  HbA1C: HbA1c, POC (controlled diabetic range)  Date/Time Value  Ref Range Status  11/15/2019 04:40 PM 9.3 (A) 0.0 - 7.0 % Final   Hgb A1c MFr Bld  Date/Time Value Ref Range Status  02/15/2024 11:17 AM 4.3 (L) 4.8 - 5.6 % Final    Comment:    (NOTE) Diagnosis of Diabetes The following HbA1c ranges recommended by the American Diabetes Association (ADA) may be used as an aid in the diagnosis of diabetes mellitus.  Hemoglobin             Suggested A1C NGSP%              Diagnosis  <5.7                   Non Diabetic  5.7-6.4                Pre-Diabetic  >6.4                   Diabetic  <7.0                   Glycemic control for                        adults with diabetes.    02/04/2023 07:55 PM 5.7 (H) 4.8 - 5.6 % Final    Comment:    (NOTE) Pre diabetes:          5.7%-6.4%  Diabetes:              >6.4%  Glycemic control for   <7.0% adults with diabetes     CBG: Recent Labs  Lab 03/16/24 2137  GLUCAP 57*    Review of Systems:   ***  Past Medical History:  She,  has a past medical history of Acute back pain with sciatica, left, Acute back pain with sciatica, right, Acute encephalopathy (05/29/2022), Acute osteomyelitis of right calcaneus (HCC) (12/21/2022), Anemia, unspecified, Atrial fibrillation with RVR (HCC)-resolved (09/09/2022), Atypical chest pain (09/10/2021), Cancer (HCC), Carcinoid tumor of duodenum (HCC), Chest pain with normal coronary angiography (2019), Chronic a-fib (HCC) (09/09/2022), Chronic pain, Chronic systolic CHF (congestive heart failure) (HCC), Dehiscence of amputation stump of right lower extremity (HCC) (01/29/2023), Diabetes mellitus, DKA (diabetic ketoacidosis) (HCC), Drug-seeking behavior, Elevated troponin, Esophageal reflux, Fibromyalgia, Gastric ulcer, Gastroparesis, Gout, HCAP (healthcare-associated pneumonia) (06/19/2022), Hyperlipidemia, Hyperosmolar hyperglycemic state (HHS) (HCC) (05/11/2022), Hyperosmolar non-ketotic state due to type 2 diabetes mellitus (HCC) (05/11/2022), Hypertension, Hypomagnesemia, Lumbosacral stenosis, LVH (left ventricular hypertrophy), Morbid obesity (HCC), Nausea & vomiting (09/09/2022), NICM (nonischemic cardiomyopathy) (HCC), PAF (paroxysmal atrial fibrillation) (HCC), Sepsis (HCC) (11/23/2022), Stroke (HCC) (02/2011), Symptomatic anemia (12/14/2022), Thrombocytosis, and Vitamin B12 deficiency anemia.   Surgical History:   Past Surgical History:  Procedure Laterality Date   ABDOMINAL AORTOGRAM W/LOWER EXTREMITY N/A 11/29/2022   Procedure: ABDOMINAL AORTOGRAM W/LOWER EXTREMITY;  Surgeon: Pearline Norman RAMAN, MD;  [facility] INVASIVE CV LAB;  Service:  Cardiovascular;  Laterality: N/A;   AMPUTATION Right 12/29/2022   Procedure: RIGHT BELOW KNEE AMPUTATION;  Surgeon: Harden Jerona GAILS, MD;  Location: [facility] OR;  Service: Orthopedics;  Laterality: Right;   AV FISTULA PLACEMENT Left 06/30/2022   Procedure: LEFT BRACHIOCEPHALIC ARTERIOVENOUS (AV) FISTULA CREATION;  Surgeon: Gretta Lonni PARAS, MD;  Location: [facility] OR;  Service: Vascular;  Laterality: Left;   BIOPSY  07/27/2019   Procedure: BIOPSY;  Surgeon: Rosalie Kitchens, MD;  Location: [facility] ENDOSCOPY;  Service: Endoscopy;;   BIOPSY  07/30/2019   Procedure: BIOPSY;  Surgeon: Elicia Claw, MD;  Location:  [facility] ENDOSCOPY;  Service: Gastroenterology;;   BIOPSY  04/28/2023   Procedure: BIOPSY;  Surgeon: Federico Rosario BROCKS, MD;  Location: [facility] ENDOSCOPY;  Service: Gastroenterology;;   CATARACT EXTRACTION  01/2014   CHOLECYSTECTOMY     COLONOSCOPY WITH PROPOFOL  N/A 07/30/2019   Procedure: COLONOSCOPY WITH PROPOFOL ;  Surgeon: Elicia Claw, MD;  Location: [facility] ENDOSCOPY;  Service: Gastroenterology;  Laterality: N/A;   ESOPHAGOGASTRODUODENOSCOPY N/A 07/27/2019   Procedure: ESOPHAGOGASTRODUODENOSCOPY (EGD);  Surgeon: Rosalie Kitchens, MD;  Location: [facility];  Service: Endoscopy;  Laterality: N/A;   ESOPHAGOGASTRODUODENOSCOPY N/A 07/26/2020   Procedure: ESOPHAGOGASTRODUODENOSCOPY (EGD);  Surgeon: Burnette Fallow, MD;  Location: [facility];  Service: Endoscopy;  Laterality: N/A;   ESOPHAGOGASTRODUODENOSCOPY (EGD) WITH PROPOFOL  N/A 08/02/2019   Procedure: ESOPHAGOGASTRODUODENOSCOPY (EGD) WITH PROPOFOL ;  Surgeon: Elicia Claw, MD;  Location: [facility] ENDOSCOPY;  Service: Gastroenterology;  Laterality: N/A;   ESOPHAGOGASTRODUODENOSCOPY (EGD) WITH PROPOFOL  N/A 12/23/2022   Procedure: ESOPHAGOGASTRODUODENOSCOPY (EGD) WITH PROPOFOL ;  Surgeon: San Sandor GAILS, DO;  Location: [facility] ENDOSCOPY;  Service: Gastroenterology;  Laterality: N/A;   ESOPHAGOGASTRODUODENOSCOPY (EGD) WITH PROPOFOL  N/A 04/28/2023   Procedure:  ESOPHAGOGASTRODUODENOSCOPY (EGD) WITH PROPOFOL ;  Surgeon: Federico Rosario BROCKS, MD;  Location: [facility] ENDOSCOPY;  Service: Gastroenterology;  Laterality: N/A;   FISTULA SUPERFICIALIZATION Left 12/31/2022   Procedure: LEFT ARM FISTULA TRANSPOSITION;  Surgeon: Sheree Penne Bruckner, MD;  Location: [facility] OR;  Service: Vascular;  Laterality: Left;   GIVENS CAPSULE STUDY N/A 12/23/2022   Procedure: GIVENS CAPSULE STUDY;  Surgeon: San Sandor GAILS, DO;  Location: [facility] ENDOSCOPY;  Service: Gastroenterology;  Laterality: N/A;   HEMOSTASIS CLIP PLACEMENT  08/02/2019   Procedure: HEMOSTASIS CLIP PLACEMENT;  Surgeon: Elicia Claw, MD;  Location: [facility] ENDOSCOPY;  Service: Gastroenterology;;   HOT HEMOSTASIS N/A 12/23/2022   Procedure: HOT HEMOSTASIS (ARGON PLASMA COAGULATION/BICAP);  Surgeon: San Sandor GAILS, DO;  Location: [facility] ENDOSCOPY;  Service: Gastroenterology;  Laterality: N/A;   INCISION AND DRAINAGE ABSCESS Left 02/16/2024   Procedure: INCISION AND DRAINAGE, ABSCESS;  Surgeon: Vernetta Berg, MD;  Location: [facility] OR;  Service: General;  Laterality: Left;  LEFT THIGH ABSCESS   IR FLUORO GUIDE CV LINE RIGHT  06/24/2022   IR US  GUIDE VASC ACCESS RIGHT  06/24/2022   POLYPECTOMY  07/30/2019   Procedure: POLYPECTOMY;  Surgeon: Elicia Claw, MD;  Location: [facility] ENDOSCOPY;  Service: Gastroenterology;;   POLYPECTOMY  08/02/2019   Procedure: POLYPECTOMY;  Surgeon: Elicia Claw, MD;  Location: [facility];  Service: Gastroenterology;;   HARLEY DILATION N/A 04/28/2023   Procedure: HARLEY DILATION;  Surgeon: Federico Rosario BROCKS, MD;  Location: [facility] ENDOSCOPY;  Service: Gastroenterology;  Laterality: N/A;   STUMP REVISION Right 02/02/2023   Procedure: REVISION RIGHT BELOW KNEE AMPUTATION;  Surgeon: Harden Jerona GAILS, MD;  Location: [facility] OR;  Service: Orthopedics;  Laterality: Right;     Social History:   reports that she has never smoked. She has never used smokeless tobacco. She reports that she does not drink  alcohol and does not use drugs.   Family History:  Her family history includes Congestive Heart Failure (age of onset: 18) in her sister; Diabetes in her brother, father, mother, and sister; Heart disease in her father.   Allergies Allergies[1]   Home Medications  Prior to Admission medications  Medication Sig Start Date End Date Taking? Authorizing Provider  albuterol  (PROVENTIL ) (2.5 MG/3ML) 0.083% nebulizer solution Take 3 mLs (2.5 mg total) by nebulization every 6 (six) hours as needed for wheezing or shortness of breath. 04/06/19   Arloa Suzen RAMAN, NP  allopurinol  (ZYLOPRIM ) 100 MG tablet Take 1 tablet (100 mg total) by mouth 2 (two) times daily. 11/30/22   Lue Elsie BROCKS, MD  ascorbic acid  (VITAMIN C ) 500 MG tablet Take 500 mg by mouth daily.    [provider]  atorvastatin  (LIPITOR) 10 MG tablet Take 10 mg by mouth every evening. 12/03/21   [provider]  b complex-vitamin c -folic acid  (NEPHRO-VITE) 0.8 MG TABS tablet Take 1 tablet by mouth at bedtime. 06/17/23   Danton Reyes DASEN, MD  busPIRone  (BUSPAR ) 5 MG tablet Take 1 tablet (5 mg total) by mouth 3 (three) times daily. 05/04/23   Sebastian Toribio GAILS, MD  calcium  carbonate (TUMS - DOSED IN MG ELEMENTAL CALCIUM ) 500 MG chewable tablet Chew 1 tablet (200 mg of elemental calcium  total) by mouth 2 (two) times daily as needed for indigestion or heartburn. 06/17/23   Danton Reyes DASEN, MD  cetirizine  (ZYRTEC ) 10 MG tablet Take 10 mg by mouth daily. 08/05/22   [provider]  cholestyramine  light (PREVALITE ) 4 g packet Take 1 packet (4 g total) by mouth 2 (two) times daily. 06/17/23   Danton Reyes DASEN, MD  diclofenac  Sodium (VOLTAREN ) 1 % GEL Apply 2 g topically 4 (four) times daily. 06/17/23   Danton Reyes DASEN, MD  famotidine  (PEPCID ) 20 MG tablet Take 20 mg by mouth daily before breakfast.    [provider]  ferric citrate  (AURYXIA ) 1 GM 210 MG(Fe) tablet Take 2 tablets (420 mg total) by mouth  3 (three) times daily with meals. 06/17/23   Danton Reyes DASEN, MD  fluticasone  (FLONASE ) 50 MCG/ACT nasal spray Place 2 sprays into both nostrils daily as needed for allergies or rhinitis. 12/19/18   Rai, Nydia POUR, MD  folic acid  (FOLVITE ) 1 MG tablet Take 1 tablet (1 mg total) by mouth daily. 01/10/23   Danford, Lonni SQUIBB, MD  hydrocerin (EUCERIN) CREA Apply 1 Application topically 2 (two) times daily. Apply to L leg and foot 2 times daily 02/25/24   Ghimire, Donalda HERO, MD  HYDROcodone -acetaminophen  (NORCO/VICODIN) 5-325 MG tablet Take 1-2 tablets by mouth every 6 (six) hours as needed for moderate pain (pain score 4-6). 02/25/24   Ghimire, Donalda HERO, MD  hydrocortisone  cream 1 % Apply 1 Application topically 2 (two) times daily.    [provider]  HYDROmorphone  (DILAUDID ) 2 MG tablet Take 1 tablet (2 mg total) by mouth every 6 (six) hours as needed for severe pain (pain score 7-10). 02/25/24   Ghimire, Donalda HERO, MD  hydrOXYzine  (ATARAX ) 25 MG tablet Take 1 tablet (25 mg total) by mouth every 4 (four) hours as needed for anxiety. 06/17/23   Danton Reyes DASEN, MD  hyoscyamine  (LEVSIN  SL) 0.125 MG SL tablet Place 1 tablet (0.125 mg total) under the tongue 3 (three) times daily. 06/17/23   Danton Reyes DASEN, MD  insulin  aspart (NOVOLOG ) 100 UNIT/ML injection Inject 0-6 Units into the skin 3 (three) times daily with meals. Patient taking differently: Inject 1-6 Units into the skin 3 (three) times daily with meals. 06/17/23   Danton Reyes DASEN, MD  loperamide  (IMODIUM ) 2 MG capsule Take 1 capsule (2 mg total) by mouth every 6 (six) hours. 06/17/23   Danton Reyes DASEN, MD  methocarbamol  (ROBAXIN ) 500 MG tablet Take 1 tablet (500 mg total) by mouth every 6 (six) hours as needed for muscle spasms. 01/10/23   Danford, Lonni SQUIBB, MD  midodrine  (PROAMATINE ) 10 MG tablet Take 1 tablet (10 mg total) by mouth every Monday,  Wednesday, and Friday with hemodialysis. Patient taking differently: Take  10 mg by mouth 3 (three) times daily. 06/17/23   Danton Reyes DASEN, MD  mirtazapine  (REMERON ) 15 MG tablet Take 15 mg by mouth at bedtime. 10/18/22   [provider]  Nutritional Supplements (,FEEDING SUPPLEMENT, PROSOURCE PLUS) liquid Take 30 mLs by mouth 2 (two) times daily between meals. 01/10/23   Danford, Lonni SQUIBB, MD  Nystatin (GERHARDT'S BUTT CREAM) CREA Apply 1 Application topically 3 (three) times daily. 02/25/24   Ghimire, Donalda HERO, MD  ondansetron  (ZOFRAN -ODT) 4 MG disintegrating tablet Take 1 tablet (4 mg total) by mouth every 8 (eight) hours as needed for nausea or vomiting. 05/04/23   Sebastian Toribio GAILS, MD  pantoprazole  (PROTONIX ) 40 MG tablet Take 1 tablet (40 mg total) by mouth 2 (two) times daily. Patient taking differently: Take 40 mg by mouth daily. 02/18/23   Raulkar, Sven SQUIBB, MD  promethazine  (PHENERGAN ) 25 MG tablet Take 1 tablet (25 mg total) by mouth 4 (four) times daily -  before meals and at bedtime. 06/17/23   Danton Reyes DASEN, MD  risperiDONE  (RISPERDAL  M-TABS) 1 MG disintegrating tablet Take 1 tablet (1 mg total) by mouth at bedtime. 05/04/23   Sebastian Toribio GAILS, MD  saccharomyces boulardii (FLORASTOR) 250 MG capsule Take 250 mg by mouth in the morning, at noon, and at bedtime.    [provider]  traZODone  (DESYREL ) 50 MG tablet Take 1 tablet (50 mg total) by mouth at bedtime. 06/17/23   Danton Reyes DASEN, MD     Critical care time: 50 minutes    Hernan Turnage, DNP, AGACNP-BC [facility]  Please see [email or web address] for pager details.  From 7A-7P if no response, please call [phone]. After hours, please call ELink [phone].       [1] Allergies Allergen Reactions   Gabapentin  Hives and [name]   Isovue  [Iopamidol ] Anaphylaxis, [name] and Other (See Comments)    11/28/17 Patient had seizure like activity and then 1 min code after 100 cc of isovue  300. Possible contrast allergy  vs  vasovagal episode  Cardiac Arrest   Nsaids Anaphylaxis and Other (See Comments)    Hx of stomach ulcers   Penicillins Itching, Palpitations and Other (See Comments)    Flushing (Red Skin) Laryngeal Edema   Reglan  [Metoclopramide ] Other (See Comments)    Tardive dyskinesia    Valium  [Diazepam ] [name]   Zestril [Lisinopril] Anaphylaxis and Swelling    Tongue and mouth swelling Laryngeal Edema   Tolectin [Tolmetin] Nausea And Vomiting, Nausea Only and Other (See Comments)    Irritates stomach ulcer   Asa [Aspirin ] Other (See Comments)    Hx of stomach ulcer   Aspartame And Phenylalanine Hives   Bentyl  [Dicyclomine ] Other (See Comments)    Chest pain   Hibiclens  [Chlorhexidine  Gluconate] Other (See Comments)    Dermatitis    Flexeril  [Cyclobenzaprine ] Palpitations   Oxycontin  [Oxycodone ] Palpitations   Rifamycins Palpitations   Tylenol  [Acetaminophen ] Nausea And Vomiting, Nausea Only and Other (See Comments)    Irritates stomach ulcer Abdominal pain   Ultram  [Tramadol ] Nausea And Vomiting and Palpitations  "

## 2024-03-16 NOTE — H&P (Incomplete)
 "  NAME:  Deborah [name], MRN:  [id number], DOB:  September 01, 1965, LOS: 0 ADMISSION DATE:  03/16/2024, CONSULTATION DATE:  03/16/24 REFERRING MD:  Guillermina RAMAN., CHIEF COMPLAINT:  Syncopal event; +LOC; shock   History of Present Illness:  Deborah [name] is a 59 yo female with past medical history significant for PAF, HLD, right BKA, ESRD on HD (TTS) who presented from SNF to [facility] after syncopal episode with +LOC. Patient hemodynamically unstable in ED despite fluid resuscitation with 3L crystalloid and was subsequently started on vasopressors. Labs notable for metabolic acidosis, sCr 7.04, BUN 23, iCa 1.00, troponin 105, lactic acic 9.0, given 1 amp HCO3. On exam patient with diffuse abdominal pain, exam somewhat limited as she has chronic pain managed daily with Dilaudid . Patient started empirically on Cefepime , Vanc, Flagyl ; CT A/P pending, and syncope workup in progress. PCCM consulted for ICU admission.  Of note, patient recently hospitalized 02/14/2024-02/25/2024 for sepsis secondary to left thigh abscess and underwent I&D with gen surg 02/16/2024.  Pertinent Medical History:   Past Medical History:  Diagnosis Date   Acute back pain with sciatica, left    Acute back pain with sciatica, right    Acute encephalopathy 05/29/2022   Acute osteomyelitis of right calcaneus (HCC) 12/21/2022   Anemia, unspecified    Atrial fibrillation with RVR (HCC)-resolved 09/09/2022   Atypical chest pain 09/10/2021   Cancer (HCC)    Carcinoid tumor of duodenum (HCC)    Chest pain with normal coronary angiography 2019   Chronic a-fib (HCC) 09/09/2022   Chronic pain    Chronic systolic CHF (congestive heart failure) (HCC)    Dehiscence of amputation stump of right lower extremity (HCC) 01/29/2023   Diabetes mellitus    DKA (diabetic ketoacidosis) (HCC)    Drug-seeking behavior    21 hospitalizations and 14 CT a/p in 2 years for N/V and abdominal pain, demanding only IV dilaudid    Elevated troponin     chronic   Esophageal reflux    Fibromyalgia    Gastric ulcer    Gastroparesis    Gout    HCAP (healthcare-associated pneumonia) 06/19/2022   Hyperlipidemia    Hyperosmolar hyperglycemic state (HHS) (HCC) 05/11/2022   Hyperosmolar non-ketotic state due to type 2 diabetes mellitus (HCC) 05/11/2022   Hypertension    Hypomagnesemia    Lumbosacral stenosis    LVH (left ventricular hypertrophy)    Morbid obesity (HCC)    Nausea & vomiting 09/09/2022   NICM (nonischemic cardiomyopathy) (HCC)    PAF (paroxysmal atrial fibrillation) (HCC)    Sepsis (HCC) 11/23/2022   Stroke (HCC) 02/2011   Symptomatic anemia 12/14/2022   Thrombocytosis    Vitamin B12 deficiency anemia    Significant Hospital Events: Including procedures, antibiotic start and stop dates in addition to other pertinent events   Admit ED s/p witnessed syncopal event w/ +LOC in shock, started on Levo/Vaso; Aline/CVC placed  Interim History / Subjective:  PCCM consulted for ICU admission  Objective    Blood pressure (!) 103/50, pulse (!) 111, temperature (!) 97.4 F (36.3 C), temperature source Oral, resp. rate 16, last menstrual period 10/10/2012, SpO2 100%.       No intake or output data in the 24 hours ending 03/16/24 2140 There were no vitals filed for this visit.  Examination: General: acute on chronically-ill obese woman moaning in pain HEENT: AT/[place], PERRL, 3mm bilaterally  Pulm: tachypneic on RA; oxygenating well CV: ST GI: soft, obese, diffuse pain to light palpation Extremities: diffuse anasarca, diffuse  epidermal peeling, multiple skin tears, existing pressure-related injuries Neuro: A&O x3, no focal deficits   Resolved Problem List:   Assessment and Plan:   Undifferentiated shock; likely septic; presented tachycardic, hypotensive, with lactic acidosis; unresponsive to fluids now on vasopressor support; source unclear; ?intraabdominal ?urinary Acute metabolic encephalopathy Lactic acidosis in setting  of shock Mild leukocytosis; afebrile Hypoglycemia in setting of critical illness/shock -Received 3L crystalloid in ED; Albumin  5% ordered -Given 1 amp D50 -Continue norepinephrine  + vaso; wean to maintain MAP >65; SBP >90 -Hydrocortisone  100mg  q8 x 48 hours -Blood cultures/ UA pending -Empirically started on Cefepime /Vanc/Flagyl  -Trend cbc, cmp, lactic, CBG -Cortisol pending -Trend WBC and monitor fever curve -Will place CVC  Syncope; unclear etiology -Workup in progress -Echo pending -TTE 2024: EF 55%; G1DD, normal biventricular function, no wma -BG 75 on arrival; given 1 amp D50; continue to trend -Bcx/UA pending  Troponinemia; likely demand d/t shock state PAF-not on [facility] -Cardiac monitoring -EKG read: ST; left ant fascicular block, consider anterior infarct; Qtc 491 -Trop 105->95 -No reports of chest, shoulder, jaw pain -TTE pending  HLD -Resume home statin when appropriate  ESRD on HD (TTS) Chronic hypotension-takes midodrine  before dialysis; however reports taking daily -Nephrology consulted; appreciate recs -Reports last dialysis 03/15/24; has L perm cath -Reports making some urine daily -sCr 7.04 on admission -Trend renal indices  Acute abdominal pain Hx duodenal carcinoid tumor s/p resection (2022) Hx gastric ulcer; bx negative Hx gastritis (2024) Chronic gastroparesis -STAT CT A/P to eval for acute intra-abdominal pathology -PPI  Anemia of chronic disease -Hgb 7.6 on admission; at baseline  -STAT CT A/P pending -Trend cbc  DM2 -Hypoglycemic on admission; given 1 amp D50 -Trend CBG -Will likely need SSI now that she is on SDS  PVD s/p R BKA -Stump appear well-healed  Sacral pressure injury -WOC to eval -Frequent turns -Apply sacral mepilex -Keep area clean  Possible vaginal yeast infection -Patient reports vaginal burning/pruritus, associated with vaginal odor; no overt red flag symptoms or signs of tissue necrosis/bleeding -Clotrimazole   cream daily x7 days  Anxiety/Depression -Resume home buspar , risperidone , trazodone  when appropriate  Chronic pain Hx fibromyalgia S/p I&D for left thigh abscess 02/16/24 -on dilaudid  daily since prior admission -MMPR; schedule tylenol , robaxin ; oxy PRN; dilaudid  for breakthrough pain -Administration of IV analgesics will be limited and carefully titrated during this acute phase  Drug-seeking behavior Opioid-use disorder Hx substance abuse -on dilaudid  PO daily since prior admission -Hx following with [facility] -Hx of dismissal with [facility] for UDS +cocaine/marijuana -Administration of IV analgesics will be limited and carefully titrated during this acute phase -UDS pending  Labs:  CBC: Recent Labs  Lab 03/16/24 1711 03/16/24 1754  WBC 11.6*  --   NEUTROABS 10.1*  --   HGB 7.6* 8.8*  HCT 23.3* 26.0*  MCV 96.3  --   PLT 145*  --     Basic Metabolic Panel: Recent Labs  Lab 03/16/24 1711 03/16/24 1754  NA 135 136  K 4.2 4.1  CL 97*  --   CO2 14*  --   GLUCOSE 75  --   BUN 23*  --   CREATININE 7.04*  --   CALCIUM  7.8*  --   MG 2.0  --   PHOS 6.3*  --    GFR: CrCl cannot be calculated (Unknown ideal weight.). Recent Labs  Lab 03/16/24 1711 03/16/24 2018  WBC 11.6*  --   LATICACIDVEN  --  9.0*  Liver Function Tests: Recent Labs  Lab 03/16/24 1711  AST 35  ALT <5  ALKPHOS 93  BILITOT 0.4  PROT 5.0*  ALBUMIN  1.9*   No results for input(s): LIPASE, AMYLASE in the last 168 hours. No results for input(s): AMMONIA in the last 168 hours.  ABG    Component Value Date/Time   PHART 7.534 (H) 02/17/2024 1415   PCO2ART 29.6 (L) 02/17/2024 1415   PO2ART 160 (H) 02/17/2024 1415   HCO3 14.7 (L) 03/16/2024 1754   TCO2 16 (L) 03/16/2024 1754   ACIDBASEDEF 12.0 (H) 03/16/2024 1754   O2SAT 77 03/16/2024 1754     Coagulation Profile: No results for input(s): INR, PROTIME in the last 168 hours.  Cardiac Enzymes: No  results for input(s): CKTOTAL, CKMB, CKMBINDEX, TROPONINI in the last 168 hours.  HbA1C: HbA1c, POC (controlled diabetic range)  Date/Time Value Ref Range Status  11/15/2019 04:40 PM 9.3 (A) 0.0 - 7.0 % Final   Hgb A1c MFr Bld  Date/Time Value Ref Range Status  02/15/2024 11:17 AM 4.3 (L) 4.8 - 5.6 % Final    Comment:    (NOTE) Diagnosis of Diabetes The following HbA1c ranges recommended by the American Diabetes Association (ADA) may be used as an aid in the diagnosis of diabetes mellitus.  Hemoglobin             Suggested A1C NGSP%              Diagnosis  <5.7                   Non Diabetic  5.7-6.4                Pre-Diabetic  >6.4                   Diabetic  <7.0                   Glycemic control for                       adults with diabetes.    02/04/2023 07:55 PM 5.7 (H) 4.8 - 5.6 % Final    Comment:    (NOTE) Pre diabetes:          5.7%-6.4%  Diabetes:              >6.4%  Glycemic control for   <7.0% adults with diabetes     CBG: Recent Labs  Lab 03/16/24 2137  GLUCAP 57*    Review of Systems:   Review of Systems  Constitutional:  Positive for malaise/fatigue.  HENT: Negative.    Eyes: Negative.   Respiratory: Negative.    Cardiovascular: Negative.   Gastrointestinal:  Positive for abdominal pain.  Genitourinary:        C/o vaginal burning, itching  Musculoskeletal:  Positive for back pain, joint pain and myalgias.  Neurological:  Positive for dizziness and weakness.  Psychiatric/Behavioral:  The patient is nervous/anxious.    Past Medical History:  She,  has a past medical history of Acute back pain with sciatica, left, Acute back pain with sciatica, right, Acute encephalopathy (05/29/2022), Acute osteomyelitis of right calcaneus (HCC) (12/21/2022), Anemia, unspecified, Atrial fibrillation with RVR (HCC)-resolved (09/09/2022), Atypical chest pain (09/10/2021), Cancer (HCC), Carcinoid tumor of duodenum (HCC), Chest pain with normal  coronary angiography (2019), Chronic a-fib (HCC) (09/09/2022), Chronic pain, Chronic systolic CHF (congestive heart failure) (HCC), Dehiscence of amputation stump of right lower extremity (  HCC) (01/29/2023), Diabetes mellitus, DKA (diabetic ketoacidosis) (HCC), Drug-seeking behavior, Elevated troponin, Esophageal reflux, Fibromyalgia, Gastric ulcer, Gastroparesis, Gout, HCAP (healthcare-associated pneumonia) (06/19/2022), Hyperlipidemia, Hyperosmolar hyperglycemic state (HHS) (HCC) (05/11/2022), Hyperosmolar non-ketotic state due to type 2 diabetes mellitus (HCC) (05/11/2022), Hypertension, Hypomagnesemia, Lumbosacral stenosis, LVH (left ventricular hypertrophy), Morbid obesity (HCC), Nausea & vomiting (09/09/2022), NICM (nonischemic cardiomyopathy) (HCC), PAF (paroxysmal atrial fibrillation) (HCC), Sepsis (HCC) (11/23/2022), Stroke (HCC) (02/2011), Symptomatic anemia (12/14/2022), Thrombocytosis, and Vitamin B12 deficiency anemia.   Surgical History:   Past Surgical History:  Procedure Laterality Date   ABDOMINAL AORTOGRAM W/LOWER EXTREMITY N/A 11/29/2022   Procedure: ABDOMINAL AORTOGRAM W/LOWER EXTREMITY;  Surgeon: Pearline Norman RAMAN, MD;  Location: [facility] INVASIVE CV LAB;  Service: Cardiovascular;  Laterality: N/A;   AMPUTATION Right 12/29/2022   Procedure: RIGHT BELOW KNEE AMPUTATION;  Surgeon: Harden Jerona GAILS, MD;  Location: [facility] OR;  Service: Orthopedics;  Laterality: Right;   AV FISTULA PLACEMENT Left 06/30/2022   Procedure: LEFT BRACHIOCEPHALIC ARTERIOVENOUS (AV) FISTULA CREATION;  Surgeon: Gretta Lonni PARAS, MD;  Location: [facility] OR;  Service: Vascular;  Laterality: Left;   BIOPSY  07/27/2019   Procedure: BIOPSY;  Surgeon: Rosalie Kitchens, MD;  Location: [facility] ENDOSCOPY;  Service: Endoscopy;;   BIOPSY  07/30/2019   Procedure: BIOPSY;  Surgeon: Elicia Claw, MD;  Location: [facility] ENDOSCOPY;  Service: Gastroenterology;;   BIOPSY  04/28/2023   Procedure: BIOPSY;  Surgeon: Federico Rosario BROCKS, MD;  Location: [facility]  ENDOSCOPY;  Service: Gastroenterology;;   CATARACT EXTRACTION  01/2014   CHOLECYSTECTOMY     COLONOSCOPY WITH PROPOFOL  N/A 07/30/2019   Procedure: COLONOSCOPY WITH PROPOFOL ;  Surgeon: Elicia Claw, MD;  Location: [facility] ENDOSCOPY;  Service: Gastroenterology;  Laterality: N/A;   ESOPHAGOGASTRODUODENOSCOPY N/A 07/27/2019   Procedure: ESOPHAGOGASTRODUODENOSCOPY (EGD);  Surgeon: Rosalie Kitchens, MD;  Location: [facility];  Service: Endoscopy;  Laterality: N/A;   ESOPHAGOGASTRODUODENOSCOPY N/A 07/26/2020   Procedure: ESOPHAGOGASTRODUODENOSCOPY (EGD);  Surgeon: Burnette Fallow, MD;  Location: [facility];  Service: Endoscopy;  Laterality: N/A;   ESOPHAGOGASTRODUODENOSCOPY (EGD) WITH PROPOFOL  N/A 08/02/2019   Procedure: ESOPHAGOGASTRODUODENOSCOPY (EGD) WITH PROPOFOL ;  Surgeon: Elicia Claw, MD;  Location: [facility] ENDOSCOPY;  Service: Gastroenterology;  Laterality: N/A;   ESOPHAGOGASTRODUODENOSCOPY (EGD) WITH PROPOFOL  N/A 12/23/2022   Procedure: ESOPHAGOGASTRODUODENOSCOPY (EGD) WITH PROPOFOL ;  Surgeon: San Sandor GAILS, DO;  Location: [facility] ENDOSCOPY;  Service: Gastroenterology;  Laterality: N/A;   ESOPHAGOGASTRODUODENOSCOPY (EGD) WITH PROPOFOL  N/A 04/28/2023   Procedure: ESOPHAGOGASTRODUODENOSCOPY (EGD) WITH PROPOFOL ;  Surgeon: Federico Rosario BROCKS, MD;  Location[facility] ENDOSCOPY;  Service: Gastroenterology;  Laterality: N/A;   FISTULA SUPERFICIALIZATION Left 12/31/2022   Procedure: LEFT ARM FISTULA TRANSPOSITION;  Surgeon: Sheree Penne Lonni, MD;  Location: [facility] OR;  Service: Vascular;  Laterality: Left;   GIVENS CAPSULE STUDY N/A 12/23/2022   Procedure: GIVENS CAPSULE STUDY;  Surgeon: [place] Sandor GAILS, DO;  Location: [facility] ENDOSCOPY;  Service: Gastroenterology;  Laterality: N/A;   HEMOSTASIS CLIP PLACEMENT  08/02/2019   Procedure: HEMOSTASIS CLIP PLACEMENT;  Surgeon: Elicia Claw, MD;  Location: [facility] ENDOSCOPY;  Service: Gastroenterology;;   HOT HEMOSTASIS N/A 12/23/2022   Procedure: HOT HEMOSTASIS (ARGON  PLASMA COAGULATION/BICAP);  Surgeon: San Sandor GAILS, DO;  Location: [facility] ENDOSCOPY;  Service: Gastroenterology;  Laterality: N/A;   INCISION AND DRAINAGE ABSCESS Left 02/16/2024   Procedure: INCISION AND DRAINAGE, ABSCESS;  Surgeon: Vernetta Berg, MD;  Location: [facility] OR;  Service: General;  Laterality: Left;  LEFT THIGH ABSCESS   IR FLUORO GUIDE CV LINE RIGHT  06/24/2022   IR US  GUIDE VASC ACCESS RIGHT  06/24/2022  POLYPECTOMY  07/30/2019   Procedure: POLYPECTOMY;  Surgeon: Elicia Claw, MD;  Location: [facility] ENDOSCOPY;  Service: Gastroenterology;;   POLYPECTOMY  08/02/2019   Procedure: POLYPECTOMY;  Surgeon: Elicia Claw, MD;  Location: [facility];  Service: Gastroenterology;;   HARLEY DILATION N/A 04/28/2023   Procedure: HARLEY DILATION;  Surgeon: Federico Rosario BROCKS, MD;  Location: [facility] ENDOSCOPY;  Service: Gastroenterology;  Laterality: N/A;   STUMP REVISION Right 02/02/2023   Procedure: REVISION RIGHT BELOW KNEE AMPUTATION;  Surgeon: Harden Jerona GAILS, MD;  Location: [facility] OR;  Service: Orthopedics;  Laterality: Right;     Social History:   reports that she has never smoked. She has never used smokeless tobacco. She reports that she does not drink alcohol and does not use drugs.   Family History:  Her family history includes Congestive Heart Failure (age of onset: 14) in her sister; Diabetes in her brother, father, mother, and sister; Heart disease in her father.   Allergies Allergies[1]   Home Medications  Prior to Admission medications  Medication Sig Start Date End Date Taking? Authorizing Provider  albuterol  (PROVENTIL ) (2.5 MG/3ML) 0.083% nebulizer solution Take 3 mLs (2.5 mg total) by nebulization every 6 (six) hours as needed for wheezing or shortness of breath. 04/06/19   Arloa Suzen RAMAN, NP  allopurinol  (ZYLOPRIM ) 100 MG tablet Take 1 tablet (100 mg total) by mouth 2 (two) times daily. 11/30/22   Lue Elsie BROCKS, MD  ascorbic acid  (VITAMIN C ) 500 MG tablet Take 500 mg by  mouth daily.    [provider]  atorvastatin  (LIPITOR) 10 MG tablet Take 10 mg by mouth every evening. 12/03/21   [provider]  b complex-vitamin c -folic acid  (NEPHRO-VITE) 0.8 MG TABS tablet Take 1 tablet by mouth at bedtime. 06/17/23   Danton Reyes DASEN, MD  busPIRone  (BUSPAR ) 5 MG tablet Take 1 tablet (5 mg total) by mouth 3 (three) times daily. 05/04/23   Sebastian Toribio GAILS, MD  calcium  carbonate (TUMS - DOSED IN MG ELEMENTAL CALCIUM ) 500 MG chewable tablet Chew 1 tablet (200 mg of elemental calcium  total) by mouth 2 (two) times daily as needed for indigestion or heartburn. 06/17/23   Danton Reyes DASEN, MD  cetirizine  (ZYRTEC ) 10 MG tablet Take 10 mg by mouth daily. 08/05/22   [provider]  cholestyramine  light (PREVALITE ) 4 g packet Take 1 packet (4 g total) by mouth 2 (two) times daily. 06/17/23   Danton Reyes DASEN, MD  diclofenac  Sodium (VOLTAREN ) 1 % GEL Apply 2 g topically 4 (four) times daily. 06/17/23   Danton Reyes DASEN, MD  famotidine  (PEPCID ) 20 MG tablet Take 20 mg by mouth daily before breakfast.    [provider]  ferric citrate  (AURYXIA ) 1 GM 210 MG(Fe) tablet Take 2 tablets (420 mg total) by mouth 3 (three) times daily with meals. 06/17/23   Danton Reyes DASEN, MD  fluticasone  (FLONASE ) 50 MCG/ACT nasal spray Place 2 sprays into both nostrils daily as needed for allergies or rhinitis. 12/19/18   Rai, Nydia POUR, MD  folic acid  (FOLVITE ) 1 MG tablet Take 1 tablet (1 mg total) by mouth daily. 01/10/23   Danford, Lonni SQUIBB, MD  hydrocerin (EUCERIN) CREA Apply 1 Application topically 2 (two) times daily. Apply to L leg and foot 2 times daily 02/25/24   Ghimire, Donalda HERO, MD  HYDROcodone -acetaminophen  (NORCO/VICODIN) 5-325 MG tablet Take 1-2 tablets by mouth every 6 (six) hours as needed for moderate pain (pain score 4-6). 02/25/24   Raenelle Donalda HERO, MD  hydrocortisone  cream 1 % Apply 1 Application topically 2 (two) times daily.    [provider]  HYDROmorphone  (DILAUDID ) 2 MG tablet Take 1 tablet (2 mg total) by mouth every 6 (six) hours as needed for severe pain (pain score 7-10). 02/25/24   Ghimire, Donalda HERO, MD  hydrOXYzine  (ATARAX ) 25 MG tablet Take 1 tablet (25 mg total) by mouth every 4 (four) hours as needed for anxiety. 06/17/23   Danton Reyes DASEN, MD  hyoscyamine  (LEVSIN  SL) 0.125 MG SL tablet Place 1 tablet (0.125 mg total) under the tongue 3 (three) times daily. 06/17/23   Danton Reyes DASEN, MD  insulin  aspart (NOVOLOG ) 100 UNIT/ML injection Inject 0-6 Units into the skin 3 (three) times daily with meals. Patient taking differently: Inject 1-6 Units into the skin 3 (three) times daily with meals. 06/17/23   Danton Reyes DASEN, MD  loperamide  (IMODIUM ) 2 MG capsule Take 1 capsule (2 mg total) by mouth every 6 (six) hours. 06/17/23   Danton Reyes DASEN, MD  methocarbamol  (ROBAXIN ) 500 MG tablet Take 1 tablet (500 mg total) by mouth every 6 (six) hours as needed for muscle spasms. 01/10/23   Jonel Lonni SQUIBB, MD  midodrine  (PROAMATINE ) 10 MG tablet Take 1 tablet (10 mg total) by mouth every Monday, Wednesday, and Friday with hemodialysis. Patient taking differently: Take 10 mg by mouth 3 (three) times daily. 06/17/23   Danton Reyes DASEN, MD  mirtazapine  (REMERON ) 15 MG tablet Take 15 mg by mouth at bedtime. 10/18/22   [provider]  Nutritional Supplements (,FEEDING SUPPLEMENT, PROSOURCE PLUS) liquid Take 30 mLs by mouth 2 (two) times daily between meals. 01/10/23   Danford, Lonni SQUIBB, MD  Nystatin (GERHARDT'S BUTT CREAM) CREA Apply 1 Application topically 3 (three) times daily. 02/25/24   Ghimire, Donalda HERO, MD  ondansetron  (ZOFRAN -ODT) 4 MG disintegrating tablet Take 1 tablet (4 mg total) by mouth every 8 (eight) hours as needed for nausea or vomiting. 05/04/23   Sebastian Toribio GAILS, MD  pantoprazole  (PROTONIX ) 40 MG tablet Take 1 tablet (40 mg total) by mouth 2 (two) times daily. Patient taking  differently: Take 40 mg by mouth daily. 02/18/23   Raulkar, Sven SQUIBB, MD  promethazine  (PHENERGAN ) 25 MG tablet Take 1 tablet (25 mg total) by mouth 4 (four) times daily -  before meals and at bedtime. 06/17/23   Danton Reyes DASEN, MD  risperiDONE  (RISPERDAL  M-TABS) 1 MG disintegrating tablet Take 1 tablet (1 mg total) by mouth at bedtime. 05/04/23   Sebastian Toribio GAILS, MD  saccharomyces boulardii (FLORASTOR) 250 MG capsule Take 250 mg by mouth in the morning, at noon, and at bedtime.    [provider]  traZODone  (DESYREL ) 50 MG tablet Take 1 tablet (50 mg total) by mouth at bedtime. 06/17/23   Danton Reyes DASEN, MD     Critical care time: 50 minutes    Harpreet Signore, DNP, AGACNP-BC [facility]  Please see [email or web address] for pager details.  From 7A-7P if no response, please call [phone]. After hours, please call ELink [phone].     [1]  Allergies Allergen Reactions   Gabapentin  Hives and [name]   Isovue  [Iopamidol ] Anaphylaxis, [name] and Other (See Comments)    11/28/17 Patient had seizure like activity and then 1 min code after 100 cc of isovue  300. Possible contrast allergy  vs vasovagal episode  Cardiac Arrest   Nsaids Anaphylaxis and Other (See Comments)    Hx of stomach ulcers  Penicillins Itching, Palpitations and Other (See Comments)    Flushing (Red Skin) Laryngeal Edema   Reglan  [Metoclopramide ] Other (See Comments)    Tardive dyskinesia    Valium  [Diazepam ] [name]   Zestril [Lisinopril] Anaphylaxis and Swelling    Tongue and mouth swelling Laryngeal Edema   Tolectin [Tolmetin] Nausea And Vomiting, Nausea Only and Other (See Comments)    Irritates stomach ulcer   Dorethia Mina ] Other (See Comments)    Hx of stomach ulcer   Aspartame And Phenylalanine Hives   Bentyl  [Dicyclomine ] Other (See Comments)    Chest pain   Hibiclens  [Chlorhexidine  Gluconate] Other (See Comments)    Dermatitis     Flexeril  [Cyclobenzaprine ] Palpitations   Oxycontin  [Oxycodone ] Palpitations   Rifamycins Palpitations   Tylenol  [Acetaminophen ] Nausea And Vomiting, Nausea Only and Other (See Comments)    Irritates stomach ulcer Abdominal pain   Ultram  [Tramadol ] Nausea And Vomiting and Palpitations   "

## 2024-03-16 NOTE — Progress Notes (Signed)
 03/16/2024   I have seen and evaluated the patient for shock and agree with APP's exam and plan written in H&P with following additions:  Chronically ill ESRD patient coming from SNF Started having witnessed syncopal events so came back in now in shock state unresponsive to fluids Having acute on chronic pain everywhere including abd Exam chronically ill, mentating okay, protecting airway, multiple pressure ulcers and R BKA noted, diffuse abd ttp, hypoactive BS Vanc/zosyn , CT A/P, HD in am, pressors for MAP 65, trend lactate, okay for some bicarb if pressor needs get high Likely needs central access WOC for sacral wounds Check cortisol She is full code  My cc time: 15 minutes   Rolan Sharps MD  Pulmonary Critical Care Prefer epic messenger for cross cover needs

## 2024-03-16 NOTE — ED Triage Notes (Signed)
 BIB [facility] from [facility] for 2 syncopal episodes  observed by staff. 1 episode observed by EMS. Appears very lethargic upon arrival. [facility]

## 2024-03-16 NOTE — Progress Notes (Signed)
 Pharmacy Antibiotic Note  Deborah [name] is a 59 y.o. female admitted on 03/16/2024 with sepsis.  Pharmacy has been consulted for vancomycin  and zosyn  dosing.  Cefepime  2g x1 given at 2004 PM. Vancomycin  2g x1 given at 2047 PM.   WBC 11.6 (ANC 10.1), Afebrile. Lactic acid elevated at 9.   Plan: No scheduled Vancomycin .  Follow-up HD plans for further dosing.  Zosyn  2.25g IV every 8 hours.      Temp (24hrs), Avg:98.4 F (36.9 C), Min:98.4 F (36.9 C), Max:98.4 F (36.9 C)  Recent Labs  Lab 03/16/24 1711 03/16/24 2018  WBC 11.6*  --   CREATININE 7.04*  --   LATICACIDVEN  --  9.0*    CrCl cannot be calculated (Unknown ideal weight.).    Allergies[1]  Antimicrobials this admission: Vancomycin  1/9 >> Zosyn  1/9 >>  Dose adjustments this admission:   Microbiology results: 1/9 BCx:  1/9 MRSA PCR:   Thank you for allowing pharmacy to be a part of this patients care.  Harlene Boga, PharmD Please refer to [facility] for [facility] Pharm[facility] numbers 03/16/2024 9:02 PM     [1]  Allergies Allergen Reactions   Gabapentin  Hives and [name]   Isovue  [Iopamidol ] Anaphylaxis, [name] and Other (See Comments)    11/28/17 Patient had seizure like activity and then 1 min code after 100 cc of isovue  300. Possible contrast allergy  vs vasovagal episode  Cardiac Arrest   Nsaids Anaphylaxis and Other (See Comments)    Hx of stomach ulcers   Penicillins Itching, Palpitations and Other (See Comments)    Flushing (Red Skin) Laryngeal Edema   Reglan  [Metoclopramide ] Other (See Comments)    Tardive dyskinesia    Valium  [Diazepam ] [name]   Zestril [Lisinopril] Anaphylaxis and Swelling    Tongue and mouth swelling Laryngeal Edema   Tolectin [Tolmetin] Nausea And Vomiting, Nausea Only and Other (See Comments)    Irritates stomach ulcer   Dorethia Leander ] Other (See Comments)    Hx of stomach ulcer   Aspartame And Phenylalanine Hives   Bentyl   [Dicyclomine ] Other (See Comments)    Chest pain   Hibiclens  [Chlorhexidine  Gluconate] Other (See Comments)    Dermatitis    Flexeril  [Cyclobenzaprine ] Palpitations   Oxycontin  [Oxycodone ] Palpitations   Rifamycins Palpitations   Tylenol  [Acetaminophen ] Nausea And Vomiting, Nausea Only and Other (See Comments)    Irritates stomach ulcer Abdominal pain   Ultram  [Tramadol ] Nausea And Vomiting and Palpitations

## 2024-03-16 NOTE — Procedures (Signed)
 Central Venous Catheter Insertion Procedure Note  SAVANNA DOOLEY  [id number]  14-Dec-1965  Date:03/16/2024  Time:10:56 PM   Provider Performing:Shaheer Bonfield H Melenie Minniear   Procedure: Insertion of Non-tunneled Central Venous Catheter(36556) with US  guidance (23062)   Indication(s) Medication administration  Consent Risks of the procedure as well as the alternatives and risks of each were explained to the patient and/or caregiver.  Consent for the procedure was obtained and is signed in the bedside chart  Anesthesia Topical only with 1% lidocaine    Timeout Verified patient identification, verified procedure, site/side was marked, verified correct patient position, special equipment/implants available, medications/allergies/relevant history reviewed, required imaging and test results available.  Sterile Technique Maximal sterile technique including full sterile barrier drape, hand hygiene, sterile gown, sterile gloves, mask, hair covering, sterile ultrasound probe cover (if used).  Procedure Description Area of catheter insertion was cleaned with chlorhexidine  and draped in sterile fashion.  With real-time ultrasound guidance a central venous catheter was placed into the right internal jugular vein. Nonpulsatile blood flow and easy flushing noted in all ports.  The catheter was sutured in place and sterile dressing applied.  Complications/Tolerance None; patient tolerated the procedure well. Chest X-ray is ordered to verify placement for internal jugular or subclavian cannulation.   Chest x-ray is not ordered for femoral cannulation.  EBL Minimal  Specimen(s) None  Warren Shade, DNP, AGACNP-BC [facility]  Please see [email or web address] for pager details.  From 7A-7P if no response, please call [phone]. After hours, please call ELink [phone].

## 2024-03-16 NOTE — ED Provider Notes (Signed)
 " [place] [facility] Provider Note   HPI/ROS    History obtained from EMS, EMR, and patient.  Deborah [name] is a 59 y.o. female who presents for Loss of Consciousness and who  has a past medical history of Acute back pain with sciatica, left, Acute back pain with sciatica, right, Acute encephalopathy (05/29/2022), Acute osteomyelitis of right calcaneus (HCC) (12/21/2022), Anemia, unspecified, Atrial fibrillation with RVR (HCC)-resolved (09/09/2022), Atypical chest pain (09/10/2021), Cancer [facility]), Carcinoid tumor of duodenum (HCC), Chest pain with normal coronary angiography (2019), Chronic a-fib (HCC) (09/09/2022), Chronic pain, Chronic systolic CHF (congestive heart failure) (HCC), Dehiscence of amputation stump of right lower extremity (HCC) (01/29/2023), Diabetes mellitus, DKA (diabetic ketoacidosis) (HCC), Drug-seeking behavior, Elevated troponin, Esophageal reflux, Fibromyalgia, Gastric ulcer, Gastroparesis, Gout, HCAP (healthcare-associated pneumonia) (06/19/2022), Hyperlipidemia, Hyperosmolar hyperglycemic state (HHS) (HCC) (05/11/2022), Hyperosmolar non-ketotic state due to type 2 diabetes mellitus (HCC) (05/11/2022), Hypertension, Hypomagnesemia, Lumbosacral stenosis, LVH (left ventricular hypertrophy), Morbid obesity (HCC), Nausea & vomiting (09/09/2022), NICM (nonischemic cardiomyopathy) (HCC), PAF (paroxysmal atrial fibrillation) (HCC), Sepsis (HCC) (11/23/2022), Stroke (HCC) (02/2011), Symptomatic anemia (12/14/2022), Thrombocytosis, and Vitamin B12 deficiency anemia.  Patient presents today from facility for 2 syncopal episodes that were observed by staff.  Also had an additional syncopal episode with EMS.  Patient does not remember any of these and states that they never happen.  States she got dialysis yesterday and got a full run without any difficulty.  Currently denies headaches, visual changes, chest pain, shortness of breath, nausea, vomiting,  diarrhea.  States she feels well currently and is not even sure why she is here.   MDM   I have reviewed the nursing documentation, vital signs, as well as the past medical history, surgical history, family history, and social history.  Initial Assessment:  Patient chronically ill-appearing on initial evaluation but otherwise hemodynamically stable.  During my initial evaluation started to have softer blood pressures.  Bedside POCUS with flat IVC with good preserved biventricular function.  Will start fluids at this time.  Unclear etiology of syncope at this time, patient denies preceding headache, abdominal pain, or chest pain.  Could possibly be on presentation of ACS, but patient currently without chest pain.  No tachycardia, tachypnea, or hypoxia at this time demonstrating PE, but could also be this.  Patient denies dyspnea.  Symmetric pulses throughout with no chest pain/abdominal pain tearing to the back consistent with aortic syndrome.  Given hypovolemia and mild hypotension, concern for sepsis at this time as well.  Alert, oriented, and nonfocal at this time so very low concern for CVA/TIA.  Will obtain lab workup as well as chest x-ray and EKG.   Patient gradually became more hypotensive in the ED with initial systolics being in the 110s and then her having further drops in her systolics down to 70s with MAP 65. Patient will be given 2 total liters of LR for volume repletion.  Patient states she took her home midodrine  which helped improve her symptoms.  If not substantial improvement in blood pressure afterwards will start pressors.  EKG here technically poor, but with sinus tachycardia with nonspecific T wave changes.  No obvious ischemia, dysrhythmia, or high-grade AV block.  VBG with pH of 7.233 without significant CO2 retention, appears to be purely metabolic acidosis.  CBC with mild leukocytosis with left shift with otherwise stable hemoglobin.  Leukocytosis is also chronic.  Mild  thrombocytopenia with platelets of 145.  CMP with hypochloremia, worsening creatinine, elevated BUN, and anion  gap of 25 with a CO2 of 14.  Gap likely in the setting of hypochloremia with worsened BUN.  Pending lactic as well.  Consistent with VBG.  Mild hyperphosphatemia at 6.3.  Troponin also elevated to 105, given hypotension here in the ED feel likely type II NSTEMI.  Will continue to trend troponin.  Patient continued to have worsening pressures despite multiple boluses of LR.  Will give bicarb, and start pressors.  Glucose here low at 57, so dextrose  given.  Lactate of 9 at this time.  Will place an A-line and admit to the MICU for further workup and management.  Patient seems to be in septic shock at this time with a lactate of 9.  Troponin still likely demand.   Disposition:  I discussed the case with Dr.Smith who graciously agreed to admit the patient to their service for continued care.    This patient was staffed with Dr. Randol who supervised the visit and agreed with the plan of care.   Due to the patients current presenting symptoms, physical exam findings, and the workup stated above, it is thought that the etiology of the patients current presentation is:  1. Hypotension, unspecified hypotension type   2. Acidosis    Clinical Complexity A medically appropriate history, review of systems, and physical exam was performed.  Factors that affect the complexity of this encounter: assessment of correct protocol, laboratory work from this visit, and review of echocardiogram/EKG results  My independent interpretations of diagnostic studies are documented in the ED course above.   If decision rules were used in this patient's evaluation, they are listed below.   Click here for ABCD2, HEART and other calculators  Patient's presentation is most consistent with acute presentation with potential threat to life or bodily function.  MDM generated using voice dictation software and may  contain dictation errors. Please contact me for any clarification or with any questions.    Physical Exam, PMH, PSH, Family History, and Social Hsitory   Vitals:   03/16/24 1835 03/16/24 1900 03/16/24 1901 03/16/24 1922  BP: (!) 79/37 (!) 127/111 (!) 73/57 131/77  Pulse:  (!) 111    Resp: 11 11 (!) 23   Temp:      TempSrc:      SpO2:  100%      Physical Exam Constitutional:      Appearance: She is ill-appearing.  HENT:     Head: Atraumatic.  Cardiovascular:     Rate and Rhythm: Regular rhythm. Tachycardia present.  Pulmonary:     Effort: Pulmonary effort is normal.     Breath sounds: Normal breath sounds. No wheezing or rales.  Abdominal:     Palpations: Abdomen is soft.     Tenderness: There is abdominal tenderness. There is no guarding or rebound.  Musculoskeletal:     Comments: Right BKA  Skin:    General: Skin is warm and dry.     Capillary Refill: Capillary refill takes less than 2 seconds.  Neurological:     General: No focal deficit present.     Mental Status: She is oriented to person, place, and time.     Past Medical History:  Diagnosis Date   Acute back pain with sciatica, left    Acute back pain with sciatica, right    Acute encephalopathy 05/29/2022   Acute osteomyelitis of right calcaneus (HCC) 12/21/2022   Anemia, unspecified    Atrial fibrillation with RVR (HCC)-resolved 09/09/2022   Atypical chest pain 09/10/2021  Cancer [facility])    Carcinoid tumor of duodenum (HCC)    Chest pain with normal coronary angiography 2019   Chronic a-fib (HCC) 09/09/2022   Chronic pain    Chronic systolic CHF (congestive heart failure) (HCC)    Dehiscence of amputation stump of right lower extremity (HCC) 01/29/2023   Diabetes mellitus    DKA (diabetic ketoacidosis) (HCC)    Drug-seeking behavior    21 hospitalizations and 14 CT a/p in 2 years for N/V and abdominal pain, demanding only IV dilaudid    Elevated troponin    chronic   Esophageal reflux    Fibromyalgia     Gastric ulcer    Gastroparesis    Gout    HCAP (healthcare-associated pneumonia) 06/19/2022   Hyperlipidemia    Hyperosmolar hyperglycemic state (HHS) (HCC) 05/11/2022   Hyperosmolar non-ketotic state due to type 2 diabetes mellitus (HCC) 05/11/2022   Hypertension    Hypomagnesemia    Lumbosacral stenosis    LVH (left ventricular hypertrophy)    Morbid obesity (HCC)    Nausea & vomiting 09/09/2022   NICM (nonischemic cardiomyopathy) (HCC)    PAF (paroxysmal atrial fibrillation) (HCC)    Sepsis (HCC) 11/23/2022   Stroke (HCC) 02/2011   Symptomatic anemia 12/14/2022   Thrombocytosis    Vitamin B12 deficiency anemia      Past Surgical History:  Procedure Laterality Date   ABDOMINAL AORTOGRAM W/LOWER EXTREMITY N/A 11/29/2022   Procedure: ABDOMINAL AORTOGRAM W/LOWER EXTREMITY;  Surgeon: Pearline Norman RAMAN, MD;  Location: [facility];  Service: Cardiovascular;  Laterality: N/A;   AMPUTATION Right 12/29/2022   Procedure: RIGHT BELOW KNEE AMPUTATION;  Surgeon: Harden Jerona GAILS, MD;  Location[facility] OR;  Service: Orthopedics;  Laterality: Right;   AV FISTULA PLACEMENT Left 06/30/2022   Procedure: LEFT BRACHIOCEPHALIC ARTERIOVENOUS (AV) FISTULA CREATION;  Surgeon: Gretta Lonni PARAS, MD;  Location: [facility] OR;  Service: Vascular;  Laterality: Left;   BIOPSY  07/27/2019   Procedure: BIOPSY;  Surgeon: Rosalie Kitchens, MD;  Location: [facility] ENDOSCOPY;  Service: Endoscopy;;   BIOPSY  07/30/2019   Procedure: BIOPSY;  Surgeon: Elicia Claw, MD;  Location: [facility] ENDOSCOPY;  Service: Gastroenterology;;   BIOPSY  04/28/2023   Procedure: BIOPSY;  Surgeon: Federico Rosario BROCKS, MD;  Location: [facility] ENDOSCOPY;  Service: Gastroenterology;;   CATARACT EXTRACTION  01/2014   CHOLECYSTECTOMY     COLONOSCOPY WITH PROPOFOL  N/A 07/30/2019   Procedure: COLONOSCOPY WITH PROPOFOL ;  Surgeon: Elicia Claw, MD;  Location: [facility] ENDOSCOPY;  Service: Gastroenterology;  Laterality: N/A;   ESOPHAGOGASTRODUODENOSCOPY N/A 07/27/2019    Procedure: ESOPHAGOGASTRODUODENOSCOPY (EGD);  Surgeon: Rosalie Kitchens, MD;  Location: [facility];  Service: Endoscopy;  Laterality: N/A;   ESOPHAGOGASTRODUODENOSCOPY N/A 07/26/2020   Procedure: ESOPHAGOGASTRODUODENOSCOPY (EGD);  Surgeon: Burnette Fallow, MD;  Location: [facility];  Service: Endoscopy;  Laterality: N/A;   ESOPHAGOGASTRODUODENOSCOPY (EGD) WITH PROPOFOL  N/A 08/02/2019   Procedure: ESOPHAGOGASTRODUODENOSCOPY (EGD) WITH PROPOFOL ;  Surgeon: Elicia Claw, MD;  Location: [facility] ENDOSCOPY;  Service: Gastroenterology;  Laterality: N/A;   ESOPHAGOGASTRODUODENOSCOPY (EGD) WITH PROPOFOL  N/A 12/23/2022   Procedure: ESOPHAGOGASTRODUODENOSCOPY (EGD) WITH PROPOFOL ;  Surgeon: San Sandor GAILS, DO;  Location: [facility] ENDOSCOPY;  Service: Gastroenterology;  Laterality: N/A;   ESOPHAGOGASTRODUODENOSCOPY (EGD) WITH PROPOFOL  N/A 04/28/2023   Procedure: ESOPHAGOGASTRODUODENOSCOPY (EGD) WITH PROPOFOL ;  Surgeon: Federico Rosario BROCKS, MD;  Location: [facility] ENDOSCOPY;  Service: Gastroenterology;  Laterality: N/A;   FISTULA SUPERFICIALIZATION Left 12/31/2022   Procedure: LEFT ARM FISTULA TRANSPOSITION;  Surgeon: Sheree Penne Lonni, MD;  Location: [facility] OR;  Service:  Vascular;  Laterality: Left;   GIVENS CAPSULE STUDY N/A 12/23/2022   Procedure: GIVENS CAPSULE STUDY;  Surgeon: San Sandor GAILS, DO;  Location: [facility] ENDOSCOPY;  Service: Gastroenterology;  Laterality: N/A;   HEMOSTASIS CLIP PLACEMENT  08/02/2019   Procedure: HEMOSTASIS CLIP PLACEMENT;  Surgeon: Elicia Claw, MD;  Location: [facility] ENDOSCOPY;  Service: Gastroenterology;;   HOT HEMOSTASIS N/A 12/23/2022   Procedure: HOT HEMOSTASIS (ARGON PLASMA COAGULATION/BICAP);  Surgeon: San Sandor GAILS, DO;  Location: [facility] ENDOSCOPY;  Service: Gastroenterology;  Laterality: N/A;   INCISION AND DRAINAGE ABSCESS Left 02/16/2024   Procedure: INCISION AND DRAINAGE, ABSCESS;  Surgeon: Vernetta Berg, MD;  Location: [facility] OR;  Service: General;  Laterality: Left;  LEFT THIGH  ABSCESS   IR FLUORO GUIDE CV LINE RIGHT  06/24/2022   IR US  GUIDE VASC ACCESS RIGHT  06/24/2022   POLYPECTOMY  07/30/2019   Procedure: POLYPECTOMY;  Surgeon: Elicia Claw, MD;  Location: [facility] ENDOSCOPY;  Service: Gastroenterology;;   POLYPECTOMY  08/02/2019   Procedure: POLYPECTOMY;  Surgeon: Elicia Claw, MD;  Location: [facility];  Service: Gastroenterology;;   HARLEY DILATION N/A 04/28/2023   Procedure: HARLEY DILATION;  Surgeon: Federico Rosario BROCKS, MD;  Location[facility] ENDOSCOPY;  Service: Gastroenterology;  Laterality: N/A;   STUMP REVISION Right 02/02/2023   Procedure: REVISION RIGHT BELOW KNEE AMPUTATION;  Surgeon: Harden Jerona GAILS, MD;  Location: [facility] OR;  Service: Orthopedics;  Laterality: Right;     Family History  Problem Relation Age of Onset   Diabetes Mother    Diabetes Father    Heart disease Father    Diabetes Sister    Congestive Heart Failure Sister 71   Diabetes Brother     Social History   Tobacco Use   Smoking status: Never   Smokeless tobacco: Never  Substance Use Topics   Alcohol use: No     Procedures   If procedures were preformed on this patient, they are listed below:  .Ultrasound ED Peripheral IV (Provider)  Date/Time: 03/16/2024 5:56 PM  Performed by: Guillermina Hamilton, MD Authorized by: Randol Simmonds, MD   Procedure details:    Indications: hypotension     Skin Prep: chlorhexidine  gluconate     Location:  Right AC   Angiocath:  20 G   Bedside Ultrasound Guided: Yes     Images: not archived     Patient tolerated procedure without complications: Yes     Dressing applied: Yes   Ultrasound ED Echo  Date/Time: 03/16/2024 5:56 PM  Performed by: Guillermina Hamilton, MD Authorized by: Randol Simmonds, MD   Procedure details:    Indications: hypotension     Views: subxiphoid, parasternal long axis view, parasternal short axis view and IVC view     Images: archived     Limitations:  Body habitus and positioning Findings:    Pericardium: no pericardial effusion      LV Function: normal (>50% EF)     RV Diameter: normal     IVC: collapsed   Impression:    Impression comment:  No obvious pericardial effusion on US .  Preserved biventricular function with no obvious focal wall motion abnormalities.  Collapsible IVC. ARTERIAL LINE  Date/Time: 03/16/2024 8:41 PM  Performed by: Guillermina Hamilton, MD Authorized by: Randol Simmonds, MD   Consent:    Consent obtained:  Verbal   Consent given by:  Patient   Risks, benefits, and alternatives were discussed: yes     Risks discussed:  Bleeding, ischemia and repeat procedure Universal protocol:  Patient identity confirmed:  Verbally with patient and arm band Indications:    Indications: hemodynamic monitoring   Pre-procedure details:    Skin preparation:  Chlorhexidine  Sedation:    Sedation type:  None Anesthesia:    Anesthesia method:  None Procedure details:    Location:  R radial   Allen's test performed: yes     Allen's test abnormal: no     Needle gauge:  20 G   Placement technique:  Seldinger   Number of attempts:  1 Post-procedure details:    Post-procedure:  Sterile dressing applied   CMS:  Normal   Procedure completion:  Tolerated well, no immediate complications    Electronically signed by:   Glendia Carlin Ancona, M.D. PGY-2, Emergency Medicine   Please note that this documentation was produced with the assistance of voice-to-text technology and may contain errors.    Ancona Glendia, MD 03/16/24 2313  "

## 2024-03-16 NOTE — Plan of Care (Signed)
 RIJ CVC malpositioned on imaging. Removed at bedside. R femoral CVC placed emergently in setting of hypotension and escalating vasopressors without reliable access.   Pilot Prindle, DNP, AGACNP-BC [facility]  Please see [email or web address] for pager details.  From 7A-7P if no response, please call [phone]. After hours, please call ELink [phone].

## 2024-03-17 ENCOUNTER — Inpatient Hospital Stay (HOSPITAL_COMMUNITY)

## 2024-03-17 ENCOUNTER — Encounter (HOSPITAL_COMMUNITY): Payer: Self-pay | Admitting: Internal Medicine

## 2024-03-17 DIAGNOSIS — R6521 Severe sepsis with septic shock: Secondary | ICD-10-CM | POA: Diagnosis not present

## 2024-03-17 DIAGNOSIS — D631 Anemia in chronic kidney disease: Secondary | ICD-10-CM | POA: Diagnosis not present

## 2024-03-17 DIAGNOSIS — Z992 Dependence on renal dialysis: Secondary | ICD-10-CM | POA: Diagnosis not present

## 2024-03-17 DIAGNOSIS — R55 Syncope and collapse: Secondary | ICD-10-CM

## 2024-03-17 DIAGNOSIS — E872 Acidosis, unspecified: Secondary | ICD-10-CM | POA: Diagnosis not present

## 2024-03-17 DIAGNOSIS — N186 End stage renal disease: Secondary | ICD-10-CM | POA: Diagnosis not present

## 2024-03-17 DIAGNOSIS — G9341 Metabolic encephalopathy: Secondary | ICD-10-CM | POA: Diagnosis not present

## 2024-03-17 DIAGNOSIS — B9689 Other specified bacterial agents as the cause of diseases classified elsewhere: Secondary | ICD-10-CM

## 2024-03-17 DIAGNOSIS — A419 Sepsis, unspecified organism: Secondary | ICD-10-CM | POA: Diagnosis not present

## 2024-03-17 DIAGNOSIS — E11649 Type 2 diabetes mellitus with hypoglycemia without coma: Secondary | ICD-10-CM | POA: Diagnosis not present

## 2024-03-17 DIAGNOSIS — R578 Other shock: Secondary | ICD-10-CM

## 2024-03-17 DIAGNOSIS — E1122 Type 2 diabetes mellitus with diabetic chronic kidney disease: Secondary | ICD-10-CM | POA: Diagnosis not present

## 2024-03-17 DIAGNOSIS — E1151 Type 2 diabetes mellitus with diabetic peripheral angiopathy without gangrene: Secondary | ICD-10-CM | POA: Diagnosis not present

## 2024-03-17 DIAGNOSIS — I120 Hypertensive chronic kidney disease with stage 5 chronic kidney disease or end stage renal disease: Secondary | ICD-10-CM | POA: Diagnosis not present

## 2024-03-17 LAB — POCT I-STAT 7, (LYTES, BLD GAS, ICA,H+H)
Acid-base deficit: 12 mmol/L — ABNORMAL HIGH (ref 0.0–2.0)
Bicarbonate: 11.2 mmol/L — ABNORMAL LOW (ref 20.0–28.0)
Calcium, Ion: 1.06 mmol/L — ABNORMAL LOW (ref 1.15–1.40)
HCT: 29 % — ABNORMAL LOW (ref 36.0–46.0)
Hemoglobin: 9.9 g/dL — ABNORMAL LOW (ref 12.0–15.0)
O2 Saturation: 98 %
Patient temperature: 97.7
Potassium: 4.4 mmol/L (ref 3.5–5.1)
Sodium: 135 mmol/L (ref 135–145)
TCO2: 12 mmol/L — ABNORMAL LOW (ref 22–32)
pCO2 arterial: 18.7 mmHg — CL (ref 32–48)
pH, Arterial: 7.384 (ref 7.35–7.45)
pO2, Arterial: 102 mmHg (ref 83–108)

## 2024-03-17 LAB — ECHOCARDIOGRAM COMPLETE
Area-P 1/2: 5.54 cm2
Calc EF: 66 %
S' Lateral: 3.1 cm
Single Plane A2C EF: 68.1 %
Single Plane A4C EF: 65.2 %
Weight: 3636.71 [oz_av]

## 2024-03-17 LAB — CBC
HCT: 25.8 % — ABNORMAL LOW (ref 36.0–46.0)
HCT: 21.9 % — ABNORMAL LOW (ref 36.0–46.0)
HCT: 20.7 % — ABNORMAL LOW (ref 36.0–46.0)
HCT: 24.4 % — ABNORMAL LOW (ref 36.0–46.0)
Hemoglobin: 8.4 g/dL — ABNORMAL LOW (ref 12.0–15.0)
Hemoglobin: 7.4 g/dL — ABNORMAL LOW (ref 12.0–15.0)
Hemoglobin: 6.8 g/dL — CL (ref 12.0–15.0)
Hemoglobin: 8 g/dL — ABNORMAL LOW (ref 12.0–15.0)
MCH: 31.1 pg (ref 26.0–34.0)
MCH: 31.2 pg (ref 26.0–34.0)
MCH: 31.1 pg (ref 26.0–34.0)
MCH: 31.4 pg (ref 26.0–34.0)
MCHC: 32.6 g/dL (ref 30.0–36.0)
MCHC: 33.8 g/dL (ref 30.0–36.0)
MCHC: 32.9 g/dL (ref 30.0–36.0)
MCHC: 32.8 g/dL (ref 30.0–36.0)
MCV: 95.6 fL (ref 80.0–100.0)
MCV: 92.4 fL (ref 80.0–100.0)
MCV: 94.5 fL (ref 80.0–100.0)
MCV: 95.7 fL (ref 80.0–100.0)
Platelets: 176 K/uL (ref 150–400)
Platelets: 163 K/uL (ref 150–400)
Platelets: 152 K/uL (ref 150–400)
Platelets: 135 K/uL — ABNORMAL LOW (ref 150–400)
RBC: 2.7 MIL/uL — ABNORMAL LOW (ref 3.87–5.11)
RBC: 2.37 MIL/uL — ABNORMAL LOW (ref 3.87–5.11)
RBC: 2.19 MIL/uL — ABNORMAL LOW (ref 3.87–5.11)
RBC: 2.55 MIL/uL — ABNORMAL LOW (ref 3.87–5.11)
RDW: 22.6 % — ABNORMAL HIGH (ref 11.5–15.5)
RDW: 22.3 % — ABNORMAL HIGH (ref 11.5–15.5)
RDW: 22.7 % — ABNORMAL HIGH (ref 11.5–15.5)
RDW: 20.7 % — ABNORMAL HIGH (ref 11.5–15.5)
WBC: 9.6 K/uL (ref 4.0–10.5)
WBC: 10.7 K/uL — ABNORMAL HIGH (ref 4.0–10.5)
WBC: 11.9 K/uL — ABNORMAL HIGH (ref 4.0–10.5)
WBC: 12.4 K/uL — ABNORMAL HIGH (ref 4.0–10.5)
nRBC: 4.2 % — ABNORMAL HIGH (ref 0.0–0.2)
nRBC: 1.6 % — ABNORMAL HIGH (ref 0.0–0.2)
nRBC: 1.9 % — ABNORMAL HIGH (ref 0.0–0.2)
nRBC: 2.1 % — ABNORMAL HIGH (ref 0.0–0.2)

## 2024-03-17 LAB — RENAL FUNCTION PANEL
Albumin: 2.1 g/dL — ABNORMAL LOW (ref 3.5–5.0)
Albumin: 2 g/dL — ABNORMAL LOW (ref 3.5–5.0)
Anion gap: 25 — ABNORMAL HIGH (ref 5–15)
Anion gap: 19 — ABNORMAL HIGH (ref 5–15)
BUN: 22 mg/dL — ABNORMAL HIGH (ref 6–20)
BUN: 19 mg/dL (ref 6–20)
CO2: 14 mmol/L — ABNORMAL LOW (ref 22–32)
CO2: 19 mmol/L — ABNORMAL LOW (ref 22–32)
Calcium: 8.1 mg/dL — ABNORMAL LOW (ref 8.9–10.3)
Calcium: 7.7 mg/dL — ABNORMAL LOW (ref 8.9–10.3)
Chloride: 96 mmol/L — ABNORMAL LOW (ref 98–111)
Chloride: 96 mmol/L — ABNORMAL LOW (ref 98–111)
Creatinine, Ser: 6.5 mg/dL — ABNORMAL HIGH (ref 0.44–1.00)
Creatinine, Ser: 5.59 mg/dL — ABNORMAL HIGH (ref 0.44–1.00)
GFR, Estimated: 7 mL/min — ABNORMAL LOW
GFR, Estimated: 8 mL/min — ABNORMAL LOW
Glucose, Bld: 131 mg/dL — ABNORMAL HIGH (ref 70–99)
Glucose, Bld: 168 mg/dL — ABNORMAL HIGH (ref 70–99)
Phosphorus: 6.1 mg/dL — ABNORMAL HIGH (ref 2.5–4.6)
Phosphorus: 5.5 mg/dL — ABNORMAL HIGH (ref 2.5–4.6)
Potassium: 4.5 mmol/L (ref 3.5–5.1)
Potassium: 4.7 mmol/L (ref 3.5–5.1)
Sodium: 135 mmol/L (ref 135–145)
Sodium: 135 mmol/L (ref 135–145)

## 2024-03-17 LAB — BASIC METABOLIC PANEL WITH GFR
Anion gap: 27 — ABNORMAL HIGH (ref 5–15)
BUN: 22 mg/dL — ABNORMAL HIGH (ref 6–20)
CO2: 12 mmol/L — ABNORMAL LOW (ref 22–32)
Calcium: 8.3 mg/dL — ABNORMAL LOW (ref 8.9–10.3)
Chloride: 97 mmol/L — ABNORMAL LOW (ref 98–111)
Creatinine, Ser: 6.6 mg/dL — ABNORMAL HIGH (ref 0.44–1.00)
GFR, Estimated: 7 mL/min — ABNORMAL LOW
Glucose, Bld: 141 mg/dL — ABNORMAL HIGH (ref 70–99)
Potassium: 4.5 mmol/L (ref 3.5–5.1)
Sodium: 135 mmol/L (ref 135–145)

## 2024-03-17 LAB — BLOOD CULTURE ID PANEL (REFLEXED) - BCID2
A.calcoaceticus-baumannii: NOT DETECTED
Bacteroides fragilis: NOT DETECTED
CTX-M ESBL: DETECTED — AB
Candida albicans: NOT DETECTED
Candida auris: NOT DETECTED
Candida glabrata: NOT DETECTED
Candida krusei: NOT DETECTED
Candida parapsilosis: NOT DETECTED
Candida tropicalis: NOT DETECTED
Carbapenem resist OXA 48 LIKE: NOT DETECTED
Carbapenem resistance IMP: NOT DETECTED
Carbapenem resistance KPC: NOT DETECTED
Carbapenem resistance NDM: NOT DETECTED
Carbapenem resistance VIM: NOT DETECTED
Cryptococcus neoformans/gattii: NOT DETECTED
Enterobacter cloacae complex: NOT DETECTED
Enterobacterales: DETECTED — AB
Enterococcus Faecium: NOT DETECTED
Enterococcus faecalis: NOT DETECTED
Escherichia coli: NOT DETECTED
Haemophilus influenzae: NOT DETECTED
Klebsiella aerogenes: NOT DETECTED
Klebsiella oxytoca: NOT DETECTED
Klebsiella pneumoniae: DETECTED — AB
Listeria monocytogenes: NOT DETECTED
Neisseria meningitidis: NOT DETECTED
Proteus species: NOT DETECTED
Pseudomonas aeruginosa: NOT DETECTED
Salmonella species: NOT DETECTED
Serratia marcescens: NOT DETECTED
Staphylococcus aureus (BCID): NOT DETECTED
Staphylococcus epidermidis: NOT DETECTED
Staphylococcus lugdunensis: NOT DETECTED
Staphylococcus species: NOT DETECTED
Stenotrophomonas maltophilia: NOT DETECTED
Streptococcus agalactiae: NOT DETECTED
Streptococcus pneumoniae: NOT DETECTED
Streptococcus pyogenes: NOT DETECTED
Streptococcus species: NOT DETECTED

## 2024-03-17 LAB — DIC (DISSEMINATED INTRAVASCULAR COAGULATION)PANEL
D-Dimer, Quant: 0.63 ug{FEU}/mL — ABNORMAL HIGH (ref 0.00–0.50)
Fibrinogen: 487 mg/dL — ABNORMAL HIGH (ref 210–475)
INR: 1.9 — ABNORMAL HIGH (ref 0.8–1.2)
Platelets: 152 K/uL (ref 150–400)
Prothrombin Time: 22.7 s — ABNORMAL HIGH (ref 11.4–15.2)
Smear Review: NONE SEEN
aPTT: 51 s — ABNORMAL HIGH (ref 24–36)

## 2024-03-17 LAB — C DIFFICILE QUICK SCREEN W PCR REFLEX
C Diff antigen: NEGATIVE
C Diff interpretation: NOT DETECTED
C Diff toxin: NEGATIVE

## 2024-03-17 LAB — PREPARE RBC (CROSSMATCH)

## 2024-03-17 LAB — CG4 I-STAT (LACTIC ACID): Lactic Acid, Venous: 9.2 mmol/L (ref 0.5–1.9)

## 2024-03-17 LAB — GLUCOSE, CAPILLARY
Glucose-Capillary: 138 mg/dL — ABNORMAL HIGH (ref 70–99)
Glucose-Capillary: 139 mg/dL — ABNORMAL HIGH (ref 70–99)
Glucose-Capillary: 141 mg/dL — ABNORMAL HIGH (ref 70–99)
Glucose-Capillary: 141 mg/dL — ABNORMAL HIGH (ref 70–99)
Glucose-Capillary: 157 mg/dL — ABNORMAL HIGH (ref 70–99)

## 2024-03-17 LAB — LACTIC ACID, PLASMA
Lactic Acid, Venous: 7.3 mmol/L (ref 0.5–1.9)
Lactic Acid, Venous: 4.1 mmol/L (ref 0.5–1.9)

## 2024-03-17 LAB — I-STAT CG4 LACTIC ACID, ED: Lactic Acid, Venous: 8.5 mmol/L (ref 0.5–1.9)

## 2024-03-17 LAB — HEPATITIS B SURFACE ANTIGEN: Hepatitis B Surface Ag: NONREACTIVE

## 2024-03-17 LAB — MRSA NEXT GEN BY PCR, NASAL: MRSA by PCR Next Gen: NOT DETECTED

## 2024-03-17 MED ORDER — SODIUM CHLORIDE 0.9% FLUSH
10.0000 mL | Freq: Two times a day (BID) | INTRAVENOUS | Status: AC
  Administered 2024-03-17 (×2): 10 mL
  Administered 2024-03-17: 20 mL
  Administered 2024-03-18: 5 mL
  Administered 2024-03-18: 30 mL
  Administered 2024-03-19 – 2024-03-21 (×5): 10 mL
  Administered 2024-03-21: 20 mL
  Administered 2024-03-22 – 2024-04-02 (×18): 10 mL
  Administered 2024-04-02: 40 mL
  Administered 2024-04-03 – 2024-04-04 (×3): 10 mL
  Administered 2024-04-04: 20 mL
  Administered 2024-04-05 – 2024-04-13 (×17): 10 mL

## 2024-03-17 MED ORDER — PANTOPRAZOLE SODIUM 40 MG IV SOLR: 40.0000 mg | Freq: Every day | INTRAVENOUS | Status: DC

## 2024-03-17 MED ORDER — SODIUM BICARBONATE 8.4 % IV SOLN
100.0000 meq | Freq: Once | INTRAVENOUS | Status: AC
  Administered 2024-03-17: 100 meq via INTRAVENOUS
  Filled 2024-03-17: qty 100

## 2024-03-17 MED ORDER — VANCOMYCIN 50 MG/ML ORAL SOLUTION: 125.0000 mg | ORAL | Status: DC

## 2024-03-17 MED ORDER — METHYLENE BLUE (ANTIDOTE) 1 % IV SOLN
200.0000 mg | Freq: Once | INTRAVENOUS | Status: AC
  Administered 2024-03-17: 200 mg via INTRAVENOUS
  Filled 2024-03-17: qty 20

## 2024-03-17 MED ORDER — VITAMIN C 500 MG PO TABS
500.0000 mg | ORAL_TABLET | Freq: Two times a day (BID) | ORAL | Status: DC
  Administered 2024-03-17: 500 mg via ORAL
  Filled 2024-03-17 (×2): qty 1

## 2024-03-17 MED ORDER — HYDROCORTISONE SOD SUC (PF) 100 MG IJ SOLR
100.0000 mg | Freq: Two times a day (BID) | INTRAMUSCULAR | Status: DC
  Administered 2024-03-17 – 2024-03-20 (×6): 100 mg via INTRAVENOUS
  Filled 2024-03-17 (×6): qty 2

## 2024-03-17 MED ORDER — STERILE WATER FOR INJECTION IV SOLN
INTRAVENOUS | Status: AC
  Filled 2024-03-17: qty 150

## 2024-03-17 MED ORDER — ANTICOAGULANT SODIUM CITRATE 4% (200MG/5ML) IV SOLN: 5.0000 mL | Status: DC | PRN

## 2024-03-17 MED ORDER — OXYCODONE HCL 5 MG PO TABS
5.0000 mg | ORAL_TABLET | Freq: Four times a day (QID) | ORAL | Status: DC | PRN
  Administered 2024-03-17 – 2024-03-31 (×19): 5 mg via ORAL
  Filled 2024-03-17 (×24): qty 1

## 2024-03-17 MED ORDER — SODIUM BICARBONATE 8.4 % IV SOLN
INTRAVENOUS | Status: AC
  Filled 2024-03-17: qty 50

## 2024-03-17 MED ORDER — PENTAFLUOROPROP-TETRAFLUOROETH EX AERO: 1.0000 | INHALATION_SPRAY | CUTANEOUS | Status: DC | PRN

## 2024-03-17 MED ORDER — OXYCODONE HCL 5 MG PO TABS
10.0000 mg | ORAL_TABLET | Freq: Four times a day (QID) | ORAL | Status: DC | PRN
  Administered 2024-03-20: 10 mg via ORAL
  Filled 2024-03-17 (×3): qty 2

## 2024-03-17 MED ORDER — VANCOMYCIN HCL 125 MG PO CAPS
125.0000 mg | ORAL_CAPSULE | Freq: Every day | ORAL | Status: AC
  Administered 2024-03-17 – 2024-03-29 (×6): 125 mg via ORAL
  Filled 2024-03-17 (×15): qty 1

## 2024-03-17 MED ORDER — HYDROCORTISONE SOD SUC (PF) 100 MG IJ SOLR
100.0000 mg | Freq: Three times a day (TID) | INTRAMUSCULAR | Status: DC
  Administered 2024-03-17 (×2): 100 mg via INTRAVENOUS
  Filled 2024-03-17 (×2): qty 2

## 2024-03-17 MED ORDER — VANCOMYCIN HCL 125 MG PO CAPS
125.0000 mg | ORAL_CAPSULE | Freq: Four times a day (QID) | ORAL | Status: DC
  Filled 2024-03-17 (×4): qty 1

## 2024-03-17 MED ORDER — RENA-VITE PO TABS
1.0000 | ORAL_TABLET | Freq: Every day | ORAL | Status: DC
  Administered 2024-03-17: 1 via ORAL
  Filled 2024-03-17: qty 1

## 2024-03-17 MED ORDER — VANCOMYCIN 50 MG/ML ORAL SOLUTION: 125.0000 mg | Freq: Every day | ORAL | Status: DC

## 2024-03-17 MED ORDER — COLLAGENASE 250 UNIT/GM EX OINT
TOPICAL_OINTMENT | Freq: Every day | CUTANEOUS | Status: DC
  Administered 2024-03-21: 1 via TOPICAL
  Administered 2024-03-28: 2 via TOPICAL
  Filled 2024-03-17 (×3): qty 30

## 2024-03-17 MED ORDER — HYDROMORPHONE HCL 1 MG/ML IJ SOLN
0.2000 mg | INTRAMUSCULAR | Status: DC | PRN
  Administered 2024-03-17 – 2024-03-18 (×3): 0.2 mg via INTRAVENOUS
  Filled 2024-03-17 (×4): qty 0.5

## 2024-03-17 MED ORDER — FIDAXOMICIN 200 MG PO TABS
200.0000 mg | ORAL_TABLET | Freq: Two times a day (BID) | ORAL | Status: DC
  Filled 2024-03-17 (×2): qty 1

## 2024-03-17 MED ORDER — MIDODRINE HCL 5 MG PO TABS
10.0000 mg | ORAL_TABLET | Freq: Three times a day (TID) | ORAL | Status: DC
  Administered 2024-03-17: 10 mg via ORAL
  Filled 2024-03-17: qty 2

## 2024-03-17 MED ORDER — HEPARIN SODIUM (PORCINE) 1000 UNIT/ML IJ SOLN
INTRAMUSCULAR | Status: AC
  Filled 2024-03-17: qty 3

## 2024-03-17 MED ORDER — VANCOMYCIN 50 MG/ML ORAL SOLUTION: 125.0000 mg | Freq: Two times a day (BID) | ORAL | Status: DC

## 2024-03-17 MED ORDER — VANCOMYCIN HCL IN DEXTROSE 1-5 GM/200ML-% IV SOLN
1000.0000 mg | INTRAVENOUS | Status: DC
  Administered 2024-03-17: 1000 mg via INTRAVENOUS
  Filled 2024-03-17: qty 200

## 2024-03-17 MED ORDER — NEPRO/CARBSTEADY PO LIQD
237.0000 mL | Freq: Three times a day (TID) | ORAL | Status: DC
  Filled 2024-03-17: qty 237

## 2024-03-17 MED ORDER — ACETAMINOPHEN 500 MG PO TABS
1000.0000 mg | ORAL_TABLET | Freq: Three times a day (TID) | ORAL | Status: DC
  Administered 2024-03-17 (×2): 1000 mg via ORAL
  Filled 2024-03-17 (×5): qty 2

## 2024-03-17 MED ORDER — METHOCARBAMOL 500 MG PO TABS
750.0000 mg | ORAL_TABLET | Freq: Three times a day (TID) | ORAL | Status: DC
  Administered 2024-03-17: 750 mg via ORAL
  Filled 2024-03-17: qty 2

## 2024-03-17 MED ORDER — SODIUM CHLORIDE 0.9 % IV SOLN
500.0000 mg | INTRAVENOUS | Status: DC
  Administered 2024-03-17: 500 mg via INTRAVENOUS
  Filled 2024-03-17: qty 10

## 2024-03-17 MED ORDER — STERILE WATER FOR INJECTION IV SOLN
INTRAVENOUS | Status: DC
  Filled 2024-03-17 (×6): qty 150

## 2024-03-17 MED ORDER — SODIUM CHLORIDE 0.9% IV SOLUTION: Freq: Once | INTRAVENOUS | Status: AC

## 2024-03-17 MED ORDER — FAMOTIDINE IN NACL 20-0.9 MG/50ML-% IV SOLN
20.0000 mg | Freq: Two times a day (BID) | INTRAVENOUS | Status: DC
  Administered 2024-03-17 (×2): 20 mg via INTRAVENOUS
  Filled 2024-03-17 (×3): qty 50

## 2024-03-17 MED ORDER — ATORVASTATIN CALCIUM 10 MG PO TABS
10.0000 mg | ORAL_TABLET | Freq: Every day | ORAL | Status: DC
  Administered 2024-03-17: 10 mg via ORAL
  Filled 2024-03-17 (×2): qty 1

## 2024-03-17 MED ORDER — MIDODRINE HCL 5 MG PO TABS
10.0000 mg | ORAL_TABLET | Freq: Three times a day (TID) | ORAL | Status: DC
  Administered 2024-03-17: 10 mg
  Filled 2024-03-17 (×2): qty 2

## 2024-03-17 MED ORDER — SODIUM CHLORIDE 0.9% FLUSH: 10.0000 mL | INTRAVENOUS | Status: DC | PRN

## 2024-03-17 MED ORDER — LIDOCAINE-PRILOCAINE 2.5-2.5 % EX CREA: 1.0000 | TOPICAL_CREAM | CUTANEOUS | Status: DC | PRN

## 2024-03-17 MED ORDER — METRONIDAZOLE 500 MG/100ML IV SOLN
500.0000 mg | Freq: Two times a day (BID) | INTRAVENOUS | Status: DC
  Administered 2024-03-17: 500 mg via INTRAVENOUS
  Filled 2024-03-17: qty 100

## 2024-03-17 MED ORDER — VANCOMYCIN 50 MG/ML ORAL SOLUTION
125.0000 mg | Freq: Four times a day (QID) | ORAL | Status: DC
  Administered 2024-03-17: 125 mg
  Filled 2024-03-17 (×4): qty 2.5

## 2024-03-17 MED ORDER — CLOTRIMAZOLE 1 % VA CREA
1.0000 | TOPICAL_CREAM | Freq: Every day | VAGINAL | Status: DC
  Administered 2024-03-17 – 2024-03-22 (×5): 1 via VAGINAL
  Filled 2024-03-17 (×2): qty 45

## 2024-03-17 MED ORDER — HEPARIN SODIUM (PORCINE) 1000 UNIT/ML DIALYSIS: 1000.0000 [IU] | INTRAMUSCULAR | Status: DC | PRN

## 2024-03-17 MED ORDER — SODIUM CHLORIDE 0.9 % IV SOLN: 2.0000 g | INTRAVENOUS | Status: DC

## 2024-03-17 MED ORDER — VANCOMYCIN 50 MG/ML ORAL SOLUTION
125.0000 mg | Freq: Every day | ORAL | Status: AC
  Administered 2024-03-18 – 2024-03-31 (×7): 125 mg
  Filled 2024-03-17 (×15): qty 2.5

## 2024-03-17 MED ORDER — HEPARIN SODIUM (PORCINE) 1000 UNIT/ML DIALYSIS
1000.0000 [IU] | INTRAMUSCULAR | Status: DC | PRN
  Administered 2024-03-20: 4000 [IU] via INTRAVENOUS_CENTRAL
  Filled 2024-03-17: qty 6
  Filled 2024-03-17: qty 4

## 2024-03-17 MED ORDER — THIAMINE MONONITRATE 100 MG PO TABS
100.0000 mg | ORAL_TABLET | Freq: Every day | ORAL | Status: DC
  Filled 2024-03-17: qty 1

## 2024-03-17 MED ORDER — LIDOCAINE HCL (PF) 1 % IJ SOLN: 5.0000 mL | INTRAMUSCULAR | Status: DC | PRN

## 2024-03-17 MED ORDER — PRISMASOL BGK 4/2.5 32-4-2.5 MEQ/L EC SOLN: Status: DC

## 2024-03-17 MED ORDER — ALTEPLASE 2 MG IJ SOLR: 2.0000 mg | Freq: Once | INTRAMUSCULAR | Status: DC | PRN

## 2024-03-17 MED ORDER — SODIUM CHLORIDE 0.9 % IV SOLN
1.0000 g | Freq: Three times a day (TID) | INTRAVENOUS | Status: DC
  Administered 2024-03-17 – 2024-03-20 (×9): 1 g via INTRAVENOUS
  Filled 2024-03-17 (×9): qty 20

## 2024-03-17 MED ORDER — NOREPINEPHRINE 16 MG/250ML-% IV SOLN
0.0000 ug/min | INTRAVENOUS | Status: DC
  Administered 2024-03-17: 18 ug/min via INTRAVENOUS
  Administered 2024-03-17: 36 ug/min via INTRAVENOUS
  Administered 2024-03-18: 1 ug/min via INTRAVENOUS
  Filled 2024-03-17 (×4): qty 250

## 2024-03-17 NOTE — Consult Note (Signed)
 WOC team consulted for sacral wound. Secure chat to primary team requesting photo be uploaded to media.   Please note that the [facility] nursing team is utilizing a standardized work plan to manage patient consults. We are triaging consults and will try to see the patients within 24 hours. Wound photos in the patient's chart allow us  to consult on the patient in the most efficient and timely manner.    Thank you,    Powell Bar MSN, RN-BC, [employer]

## 2024-03-17 NOTE — Procedures (Signed)
 I have reviewed the CRRT procedure and made adjustments as needed.  Myer Fret MD  [facility] 03/17/2024, 4:06 PM

## 2024-03-17 NOTE — Consult Note (Signed)
 Renal Service Consult Note [facility] Lamar JONETTA Fret, MD  Patient: Deborah [name] Date: 03/17/2024 Requesting Physician: Dr. FREDRIK Gaskins  Reason for Consult: ESRD pt w/ syncope episodes and hypotension HPI: The patient is a 59 y.o. year-old w/ PMH sig for atrial fib, chronic syst HF, DM hx of DKA, HTN, morbid obesity, gastroparesis, hx CVA, anemia who presented to ED last evening sent from SNF for 2 syncopal episodes observed by staff. TTS HD. Was very lethargic on arrival. In ED bp's were soft, POCUS showed flat IVC and IVF's were given. Low bp's felt possibly due to sepsis and IVF's and IV abx were given. CXR was negative, K+ 4.5, bun 22, creat 6.6, wBC 9K, Hb 8.4, plts wnl.  Pt was admitted to ICU and vasopressors were required. Pt rec'd IV bicarb gtt for acidemia w/ pH 7.2 on VBG. Renal team consulted for need for RRT.     This am levo gtt is 10 micrograms/ min and vaso at 0.04 mcg/min. D/w CCM MD that acidemia treatment seems to improved BP's and that CRRT should be considered for metabolic acidosis assoc w/ shock state. Pt provided no hx.    ROS - n/a  Past Medical History  Past Medical History:  Diagnosis Date   Acute back pain with sciatica, left    Acute back pain with sciatica, right    Acute encephalopathy 05/29/2022   Acute osteomyelitis of right calcaneus (HCC) 12/21/2022   Anemia, unspecified    Atrial fibrillation with RVR (HCC)-resolved 09/09/2022   Atypical chest pain 09/10/2021   Cancer (HCC)    Carcinoid tumor of duodenum (HCC)    Chest pain with normal coronary angiography 2019   Chronic a-fib (HCC) 09/09/2022   Chronic pain    Chronic systolic CHF (congestive heart failure) (HCC)    Dehiscence of amputation stump of right lower extremity (HCC) 01/29/2023   Diabetes mellitus    DKA (diabetic ketoacidosis) (HCC)    Drug-seeking behavior    21 hospitalizations and 14 CT a/p in 2 years for N/V and abdominal pain, demanding only IV dilaudid     Elevated troponin    chronic   Esophageal reflux    Fibromyalgia    Gastric ulcer    Gastroparesis    Gout    HCAP (healthcare-associated pneumonia) 06/19/2022   Hyperlipidemia    Hyperosmolar hyperglycemic state (HHS) (HCC) 05/11/2022   Hyperosmolar non-ketotic state due to type 2 diabetes mellitus (HCC) 05/11/2022   Hypertension    Hypomagnesemia    Lumbosacral stenosis    LVH (left ventricular hypertrophy)    Morbid obesity (HCC)    Nausea & vomiting 09/09/2022   NICM (nonischemic cardiomyopathy) (HCC)    PAF (paroxysmal atrial fibrillation) (HCC)    Sepsis (HCC) 11/23/2022   Stroke (HCC) 02/2011   Symptomatic anemia 12/14/2022   Thrombocytosis    Vitamin B12 deficiency anemia    Past Surgical History  Past Surgical History:  Procedure Laterality Date   ABDOMINAL AORTOGRAM W/LOWER EXTREMITY N/A 11/29/2022   Procedure: ABDOMINAL AORTOGRAM W/LOWER EXTREMITY;  Surgeon: Pearline Norman RAMAN, MD;  Location: [facility];  Service: Cardiovascular;  Laterality: N/A;   AMPUTATION Right 12/29/2022   Procedure: RIGHT BELOW KNEE AMPUTATION;  Surgeon: Harden Jerona GAILS, MD;  Location[facility] OR;  Service: Orthopedics;  Laterality: Right;   AV FISTULA PLACEMENT Left 06/30/2022   Procedure: LEFT BRACHIOCEPHALIC ARTERIOVENOUS (AV) FISTULA CREATION;  Surgeon: Gaskins Lonni PARAS, MD;  Location: [facility] OR;  Service: Vascular;  Laterality:  Left;   BIOPSY  07/27/2019   Procedure: BIOPSY;  Surgeon: Rosalie Kitchens, MD;  Location: [facility] ENDOSCOPY;  Service: Endoscopy;;   BIOPSY  07/30/2019   Procedure: BIOPSY;  Surgeon: Elicia Claw, MD;  Location: [facility] ENDOSCOPY;  Service: Gastroenterology;;   BIOPSY  04/28/2023   Procedure: BIOPSY;  Surgeon: Federico Rosario BROCKS, MD;  Location: [facility] ENDOSCOPY;  Service: Gastroenterology;;   CATARACT EXTRACTION  01/2014   CHOLECYSTECTOMY     COLONOSCOPY WITH PROPOFOL  N/A 07/30/2019   Procedure: COLONOSCOPY WITH PROPOFOL ;  Surgeon: Elicia Claw, MD;  Location: [facility] ENDOSCOPY;   Service: Gastroenterology;  Laterality: N/A;   ESOPHAGOGASTRODUODENOSCOPY N/A 07/27/2019   Procedure: ESOPHAGOGASTRODUODENOSCOPY (EGD);  Surgeon: Rosalie Kitchens, MD;  Location: [facility];  Service: Endoscopy;  Laterality: N/A;   ESOPHAGOGASTRODUODENOSCOPY N/A 07/26/2020   Procedure: ESOPHAGOGASTRODUODENOSCOPY (EGD);  Surgeon: Burnette Fallow, MD;  Location: [facility];  Service: Endoscopy;  Laterality: N/A;   ESOPHAGOGASTRODUODENOSCOPY (EGD) WITH PROPOFOL  N/A 08/02/2019   Procedure: ESOPHAGOGASTRODUODENOSCOPY (EGD) WITH PROPOFOL ;  Surgeon: Elicia Claw, MD;  Location: [facility] ENDOSCOPY;  Service: Gastroenterology;  Laterality: N/A;   ESOPHAGOGASTRODUODENOSCOPY (EGD) WITH PROPOFOL  N/A 12/23/2022   Procedure: ESOPHAGOGASTRODUODENOSCOPY (EGD) WITH PROPOFOL ;  Surgeon: San Sandor GAILS, DO;  Location: [facility] ENDOSCOPY;  Service: Gastroenterology;  Laterality: N/A;   ESOPHAGOGASTRODUODENOSCOPY (EGD) WITH PROPOFOL  N/A 04/28/2023   Procedure: ESOPHAGOGASTRODUODENOSCOPY (EGD) WITH PROPOFOL ;  Surgeon: Federico Rosario BROCKS, MD;  Location: [facility] ENDOSCOPY;  Service: Gastroenterology;  Laterality: N/A;   FISTULA SUPERFICIALIZATION Left 12/31/2022   Procedure: LEFT ARM FISTULA TRANSPOSITION;  Surgeon: Sheree Penne Bruckner, MD;  Location: [facility] OR;  Service: Vascular;  Laterality: Left;   GIVENS CAPSULE STUDY N/A 12/23/2022   Procedure: GIVENS CAPSULE STUDY;  Surgeon: San Sandor GAILS, DO;  Location: [facility] ENDOSCOPY;  Service: Gastroenterology;  Laterality: N/A;   HEMOSTASIS CLIP PLACEMENT  08/02/2019   Procedure: HEMOSTASIS CLIP PLACEMENT;  Surgeon: Elicia Claw, MD;  Location: [facility] ENDOSCOPY;  Service: Gastroenterology;;   HOT HEMOSTASIS N/A 12/23/2022   Procedure: HOT HEMOSTASIS (ARGON PLASMA COAGULATION/BICAP);  Surgeon: San Sandor GAILS, DO;  Location: [facility] ENDOSCOPY;  Service: Gastroenterology;  Laterality: N/A;   INCISION AND DRAINAGE ABSCESS Left 02/16/2024   Procedure: INCISION AND DRAINAGE, ABSCESS;  Surgeon:  Vernetta Berg, MD;  Location: [facility] OR;  Service: General;  Laterality: Left;  LEFT THIGH ABSCESS   IR FLUORO GUIDE CV LINE RIGHT  06/24/2022   IR US  GUIDE VASC ACCESS RIGHT  06/24/2022   POLYPECTOMY  07/30/2019   Procedure: POLYPECTOMY;  Surgeon: Elicia Claw, MD;  Location: [facility] ENDOSCOPY;  Service: Gastroenterology;;   POLYPECTOMY  08/02/2019   Procedure: POLYPECTOMY;  Surgeon: Elicia Claw, MD;  Location: [facility];  Service: Gastroenterology;;   HARLEY DILATION N/A 04/28/2023   Procedure: HARLEY DILATION;  Surgeon: Federico Rosario BROCKS, MD;  Location: [facility] ENDOSCOPY;  Service: Gastroenterology;  Laterality: N/A;   STUMP REVISION Right 02/02/2023   Procedure: REVISION RIGHT BELOW KNEE AMPUTATION;  Surgeon: Harden Jerona GAILS, MD;  Location: [facility] OR;  Service: Orthopedics;  Laterality: Right;   Family History  Family History  Problem Relation Age of Onset   Diabetes Mother    Diabetes Father    Heart disease Father    Diabetes Sister    Congestive Heart Failure Sister 52   Diabetes Brother    Social History  reports that she has never smoked. She has never used smokeless tobacco. She reports that she does not drink alcohol and does not use drugs. Allergies Allergies[1] Home medications Prior to Admission medications  Medication  Sig Start Date End Date Taking? Authorizing Provider  albuterol  (PROVENTIL ) (2.5 MG/3ML) 0.083% nebulizer solution Take 3 mLs (2.5 mg total) by nebulization every 6 (six) hours as needed for wheezing or shortness of breath. 04/06/19  Yes Arloa Suzen RAMAN, NP  allopurinol  (ZYLOPRIM ) 100 MG tablet Take 1 tablet (100 mg total) by mouth 2 (two) times daily. 11/30/22  Yes Lue Elsie BROCKS, MD  ascorbic acid  (VITAMIN C ) 500 MG tablet Take 500 mg by mouth daily.   Yes [provider]  atorvastatin  (LIPITOR) 10 MG tablet Take 10 mg by mouth every evening. 12/03/21  Yes [provider]  b complex-vitamin c -folic acid  (NEPHRO-VITE) 0.8 MG TABS tablet Take  1 tablet by mouth at bedtime. 06/17/23  Yes Danton Reyes DASEN, MD  busPIRone  (BUSPAR ) 10 MG tablet Take 10 mg by mouth 3 (three) times daily.   Yes [provider]  calcium  carbonate (TUMS - DOSED IN MG ELEMENTAL CALCIUM ) 500 MG chewable tablet Chew 1 tablet (200 mg of elemental calcium  total) by mouth 2 (two) times daily as needed for indigestion or heartburn. 06/17/23  Yes Danton Reyes DASEN, MD  cetirizine  (ZYRTEC ) 10 MG tablet Take 10 mg by mouth daily. 08/05/22  Yes [provider]  cholestyramine  light (PREVALITE ) 4 g packet Take 1 packet (4 g total) by mouth 2 (two) times daily. 06/17/23  Yes Danton Reyes DASEN, MD  diclofenac  Sodium (VOLTAREN ) 1 % GEL Apply 2 g topically 4 (four) times daily. 06/17/23  Yes Danton Reyes DASEN, MD  famotidine  (PEPCID ) 20 MG tablet Take 20 mg by mouth daily before breakfast.   Yes [provider]  fluticasone  (FLONASE ) 50 MCG/ACT nasal spray Place 2 sprays into both nostrils daily as needed for allergies or rhinitis. Patient taking differently: Place 2 sprays into both nostrils in the morning and at bedtime. 12/19/18  Yes Rai, Ripudeep K, MD  folic acid  (FOLVITE ) 1 MG tablet Take 1 tablet (1 mg total) by mouth daily. 01/10/23  Yes Danford, Lonni SQUIBB, MD  hydrocerin (EUCERIN) CREA Apply 1 Application topically 2 (two) times daily. Apply to L leg and foot 2 times daily 02/25/24  Yes Ghimire, Donalda HERO, MD  HYDROcodone -acetaminophen  (NORCO/VICODIN) 5-325 MG tablet Take 1-2 tablets by mouth every 6 (six) hours as needed for moderate pain (pain score 4-6). 02/25/24  Yes Ghimire, Donalda HERO, MD  hydrocortisone  cream 1 % Apply 1 Application topically 2 (two) times daily.   Yes [provider]  HYDROmorphone  (DILAUDID ) 2 MG tablet Take 1 tablet (2 mg total) by mouth every 6 (six) hours as needed for severe pain (pain score 7-10). 02/25/24  Yes Ghimire, Donalda HERO, MD  hydrOXYzine  (ATARAX ) 25 MG tablet Take 1 tablet (25 mg total) by mouth  every 4 (four) hours as needed for anxiety. 06/17/23  Yes Danton Reyes DASEN, MD  hyoscyamine  (LEVSIN  SL) 0.125 MG SL tablet Place 1 tablet (0.125 mg total) under the tongue 3 (three) times daily. 06/17/23  Yes Danton Reyes DASEN, MD  loperamide  (IMODIUM ) 2 MG capsule Take 1 capsule (2 mg total) by mouth every 6 (six) hours. 06/17/23  Yes Danton Reyes DASEN, MD  methocarbamol  (ROBAXIN ) 500 MG tablet Take 1 tablet (500 mg total) by mouth every 6 (six) hours as needed for muscle spasms. 01/10/23  Yes Danford, Lonni SQUIBB, MD  midodrine  (PROAMATINE ) 10 MG tablet Take 1 tablet (10 mg total) by mouth every Monday, Wednesday, and Friday with hemodialysis. 06/17/23  Yes Danton Reyes DASEN, MD  mirtazapine  (REMERON )  15 MG tablet Take 15 mg by mouth at bedtime. 10/18/22  Yes [provider]  nicotine (NICODERM CQ - DOSED IN MG/24 HR) 7 mg/24hr patch Place 7 mg onto the skin daily.   Yes [provider]  Nutritional Supplements (,FEEDING SUPPLEMENT, PROSOURCE PLUS) liquid Take 30 mLs by mouth 2 (two) times daily between meals. 01/10/23  Yes Danford, Lonni SQUIBB, MD  Nystatin (GERHARDT'S BUTT CREAM) CREA Apply 1 Application topically 3 (three) times daily. 02/25/24  Yes Ghimire, Donalda HERO, MD  ondansetron  (ZOFRAN ) 4 MG tablet Take 4 mg by mouth every 8 (eight) hours as needed for nausea or vomiting.   Yes [provider]  pantoprazole  (PROTONIX ) 40 MG tablet Take 1 tablet (40 mg total) by mouth 2 (two) times daily. 02/18/23  Yes Raulkar, Sven SQUIBB, MD  promethazine  (PHENERGAN ) 25 MG tablet Take 1 tablet (25 mg total) by mouth 4 (four) times daily -  before meals and at bedtime. 06/17/23  Yes Danton Reyes DASEN, MD  risperiDONE  (RISPERDAL  M-TABS) 1 MG disintegrating tablet Take 1 tablet (1 mg total) by mouth at bedtime. 05/04/23  Yes Sebastian Toribio GAILS, MD  saccharomyces boulardii (FLORASTOR) 250 MG capsule Take 250 mg by mouth in the morning, at noon, and at bedtime.   Yes [provider]  sevelamer carbonate (RENVELA) 800 MG tablet Take 1,600 mg by mouth 3 (three) times daily with meals.   Yes [provider]  traZODone  (DESYREL ) 50 MG tablet Take 1 tablet (50 mg total) by mouth at bedtime. Patient taking differently: Take 75 mg by mouth at bedtime. 06/17/23  Yes Danton Reyes T, MD     Vitals:   03/17/24 9354 03/17/24 0650 03/17/24 0655 03/17/24 0700  BP:      Pulse: (!) 118 (!) 118 (!) 118 (!) 117  Resp: 19 17 15 17   Temp:      TempSrc:      SpO2: 100% 99% 93% 100%  Weight:       Exam Gen lethargic, somnolent, responds briefly to loud voice Sclera anicteric, throat clear  No jvd or bruits Chest clear bilat to bases RRR no MRG Abd soft ntnd no mass or ascites +bs, obesity Ext 1-2+ bilat LE edema, no other edema Neuro is alert, Ox 3 , nf    RIJ TDC intact  Home bp meds: Midodrine  10 mg pre HD TTS   OP HD: [place] TTS 4h  B400   96.1kg  TDC   Hep none Last OP HD 1/08, post wt 100kg   Assessment/ Plan:  # ESRD - on HD TTS, last HD 2 days ago - pt in shock on 2 pressors, will cancel orders for HD - will start CRRT using TDC w/ bicarb pre/ post    # hypotension - requiring 2 vasopressors - blood cx's pending, getting IV abx per CCM   # Volume - chronic LE edema, came off 4kg over last OP HD - CXR clear, no pulm edema or congestion - doubt intravasc vol overload  # Anemia of esrd - Hb 7-10 here - follow, transfuse prn      Myer Fret  MD [facility] 03/17/2024, 8:55 AM  Recent Labs  Lab 03/16/24 1711 03/16/24 1754 03/17/24 0116 03/17/24 0258 03/17/24 0828  HGB 7.6*   < > 9.9* 8.4* 7.4*  ALBUMIN  1.9*  --   --   --   --   CALCIUM  7.8*  --   --  8.3*  --   PHOS  6.3*  --   --   --   --   CREATININE 7.04*  --   --  6.60*  --   K 4.2   < > 4.4 4.5  --    < > = values in this interval not displayed.   Inpatient medications:  acetaminophen   1,000 mg Oral Q8H   clotrimazole   1 Applicatorful Vaginal QHS    hydrocortisone  sod succinate (SOLU-CORTEF ) inj  100 mg Intravenous Q8H   methocarbamol   750 mg Oral TID   pantoprazole  (PROTONIX ) IV  40 mg Intravenous QHS   sodium bicarbonate        sodium chloride  flush  10-40 mL Intracatheter Q12H   vancomycin  variable dose per unstable renal function (pharmacist dosing)   Does not apply See admin instructions    meropenem  (MERREM ) IV     norepinephrine  (LEVOPHED ) Adult infusion 10 mcg/min (03/17/24 0700)   prismasol  BGK 4/2.5     sodium bicarbonate  150 mEq in sterile water  1,150 mL infusion     sodium bicarbonate  150 mEq in sterile water  1,150 mL infusion     vasopressin  0.04 Units/min (03/17/24 0700)   heparin , HYDROmorphone  (DILAUDID ) injection, oxyCODONE  **OR** oxyCODONE , polyethylene glycol, senna, sodium bicarbonate , sodium chloride  flush      [1]  Allergies Allergen Reactions   Dorethia Jasmine ] Other (See Comments)    Hx of stomach ulcer   Bentyl  [Dicyclomine ] Other (See Comments)    Chest pain   Gabapentin  Hives and [name]   Isovue  [Iopamidol ] Anaphylaxis, [name] and Other (See Comments)    11/28/17 Patient had seizure like activity and then 1 min code after 100 cc of isovue  300. Possible contrast allergy  vs vasovagal episode  Cardiac Arrest   Nsaids Anaphylaxis and Other (See Comments)    Hx of stomach ulcers   Penicillins Itching, Palpitations and Other (See Comments)    Flushing (Red Skin) Laryngeal Edema   Reglan  [Metoclopramide ] Other (See Comments)    Tardive dyskinesia    Valium  [Diazepam ] [name]   Zestril [Lisinopril] Anaphylaxis and Swelling    Tongue and mouth swelling Laryngeal Edema   Tolectin [Tolmetin] Nausea And Vomiting, Nausea Only and Other (See Comments)    Irritates stomach ulcer   Aspartame And Phenylalanine Hives   Flexeril  [Cyclobenzaprine ] Palpitations   Hibiclens  [Chlorhexidine  Gluconate] Dermatitis   Oxycontin  [Oxycodone ] Palpitations   Rifamycins Palpitations    Tylenol  [email or web address] ] Nausea And Vomiting, Nausea Only and Other (See Comments)    Irritates stomach ulcer Abdominal pain   Ultram  [Tramadol ] Nausea And Vomiting and Palpitations

## 2024-03-17 NOTE — Progress Notes (Signed)
 PHARMACY - PHYSICIAN COMMUNICATION CRITICAL VALUE ALERT - BLOOD CULTURE IDENTIFICATION (BCID)  Deborah [name] is an 59 y.o. female who presented to [facility] on 03/16/2024 with a chief complaint of septic shock  Assessment:   Blood cultures growing ESBL Klebsiella pneumoniae  Name of physician (or Provider) Contacted:  Dr. Gretta   Current antibiotics:  Vancomycin  and Cefepime  and Flagyl   Changes to prescribed antibiotics recommended:  Change to Meropenem   Results for orders placed or performed during the hospital encounter of 03/16/24  Blood Culture ID Panel (Reflexed) (Collected: 03/16/2024  6:00 PM)  Result Value Ref Range   Enterococcus faecalis NOT DETECTED NOT DETECTED   Enterococcus Faecium NOT DETECTED NOT DETECTED   Listeria monocytogenes NOT DETECTED NOT DETECTED   Staphylococcus species NOT DETECTED NOT DETECTED   Staphylococcus aureus (BCID) NOT DETECTED NOT DETECTED   Staphylococcus epidermidis NOT DETECTED NOT DETECTED   Staphylococcus lugdunensis NOT DETECTED NOT DETECTED   Streptococcus species NOT DETECTED NOT DETECTED   Streptococcus agalactiae NOT DETECTED NOT DETECTED   Streptococcus pneumoniae NOT DETECTED NOT DETECTED   Streptococcus pyogenes NOT DETECTED NOT DETECTED   A.calcoaceticus-baumannii NOT DETECTED NOT DETECTED   Bacteroides fragilis NOT DETECTED NOT DETECTED   Enterobacterales DETECTED (A) NOT DETECTED   Enterobacter cloacae complex NOT DETECTED NOT DETECTED   Escherichia coli NOT DETECTED NOT DETECTED   Klebsiella aerogenes NOT DETECTED NOT DETECTED   Klebsiella oxytoca NOT DETECTED NOT DETECTED   Klebsiella pneumoniae DETECTED (A) NOT DETECTED   Proteus species NOT DETECTED NOT DETECTED   Salmonella species NOT DETECTED NOT DETECTED   Serratia marcescens NOT DETECTED NOT DETECTED   Haemophilus influenzae NOT DETECTED NOT DETECTED   Neisseria meningitidis NOT DETECTED NOT DETECTED   Pseudomonas aeruginosa NOT DETECTED NOT DETECTED    Stenotrophomonas maltophilia NOT DETECTED NOT DETECTED   Candida albicans NOT DETECTED NOT DETECTED   Candida auris NOT DETECTED NOT DETECTED   Candida glabrata NOT DETECTED NOT DETECTED   Candida krusei NOT DETECTED NOT DETECTED   Candida parapsilosis NOT DETECTED NOT DETECTED   Candida tropicalis NOT DETECTED NOT DETECTED   Cryptococcus neoformans/gattii NOT DETECTED NOT DETECTED   CTX-M ESBL DETECTED (A) NOT DETECTED   Carbapenem resistance IMP NOT DETECTED NOT DETECTED   Carbapenem resistance KPC NOT DETECTED NOT DETECTED   Carbapenem resistance NDM NOT DETECTED NOT DETECTED   Carbapenem resist OXA 48 LIKE NOT DETECTED NOT DETECTED   Carbapenem resistance VIM NOT DETECTED NOT DETECTED    Dail Cordella Misty 03/17/2024  7:24 AM

## 2024-03-17 NOTE — Progress Notes (Signed)
 Pt. Will not be doing HD today due to the condition of the patient. Pt. Is on 2 vasopressors. Dr. Gearline, just cancelled the HD just now.

## 2024-03-17 NOTE — Progress Notes (Signed)
 PHARMACY ANTIBIOTIC CONSULT NOTE   Deborah [name] a 59 y.o. female admitted on 03/17/2023 from SNF after syncopal episode with LOC, to ICU with presumed septic shock requiring vasopressors, started on CRRT 1/10.  Pharmacy has been consulted for vancomycin , merrem  dosing.  ESRD on iHD TTS PTA - last session 1/8 (per patient, makes some urine daily)  Recent admit 12/9-20 for sepsis secondary to L thigh abscess s/p I&D 12/11.  12/11 BCx neg, 12/11 fluid path with moderate proteus mirabilis, few enterococcus avium, few prevotella buccae (BL-positive)  Loaded with vancomycin  2g 1/9 and started on CFP + MTZ.   Now with ESBL Klebsiella pneumoniae in the blood.  WBC 10.7, afebrile pre-CRRT  Also with ongoing diarrhea - CCM started on empiric PO vancomycin  treatment.   C diff Ag/toxin both negative.  With hx of prior C. Diff infection, will do PO vancomycin  daily as prophylaxis while on broad spectrum antibiotics to prevent recurrence.  Estimated Creatinine Clearance: 11.3 mL/min (A) (by C-G formula based on SCr of 6.5 mg/dL (H)).  Plan: Merrem  1g IV Q8h  Vancomycin  1000 mg IV Q24h Transition from treatment PO vancomycin  to prophylaxis - 125 mg PO/PT daily (continue until broad spec abx stopped) Monitor RRT, clinical status, de-escalation, C/S, levels as indicated   Allergies:  Allergies[1]  Filed Weights   03/17/24 0100  Weight: 103.1 kg (227 lb 4.7 oz)       Latest Ref Rng & Units 03/17/2024    8:28 AM 03/17/2024    2:58 AM 03/17/2024    1:16 AM  CBC  WBC 4.0 - 10.5 K/uL 10.7  9.6    Hemoglobin 12.0 - 15.0 g/dL 7.4  8.4  9.9   Hematocrit 36.0 - 46.0 % 21.9  25.8  29.0   Platelets 150 - 400 K/uL 163  176      Antibiotics Given (last 72 hours)     Date/Time Action Medication Dose Rate   03/16/24 2004 New Bag/Given   ceFEPIme  (MAXIPIME ) 2 g in sodium chloride  0.9 % 100 mL IVPB 2 g 200 mL/hr   03/16/24 2047 New Bag/Given   vancomycin  (VANCOREADY) IVPB 2000 mg/400 mL 2,000 mg  200 mL/hr   03/17/24 9356 New Bag/Given   metroNIDAZOLE  (FLAGYL ) IVPB 500 mg 500 mg 100 mL/hr   03/17/24 9045 New Bag/Given   meropenem  (MERREM ) 500 mg in sodium chloride  0.9 % 100 mL IVPB 500 mg 200 mL/hr   03/17/24 1213 Given   vancomycin  (VANCOCIN ) 50 mg/mL oral solution SOLN 125 mg 125 mg        Antimicrobials this admission: CFP/MTZ 1/9 x1 Merrem  1/10 > c Vancomycin  IV 1/9 > c PO vancomycin  daily (C diff ppx) 1/10 > c  Microbiology results: 1/9 Bcx: Klebsiella pneumoniae (ESBL) 1/10 MRSA PCR: negative  Thank you for allowing pharmacy to be a part of this patients care.  Maurilio Fila, PharmD Clinical Pharmacist 03/17/2024  1:38 PM      [1]  Allergies Allergen Reactions   Dorethia Knee ] Other (See Comments)    Hx of stomach ulcer   Bentyl  [Dicyclomine ] Other (See Comments)    Chest pain   Gabapentin  Hives and [name]   Isovue  [Iopamidol ] Anaphylaxis, [name] and Other (See Comments)    11/28/17 Patient had seizure like activity and then 1 min code after 100 cc of isovue  300. Possible contrast allergy  vs vasovagal episode  Cardiac Arrest   Nsaids Anaphylaxis and Other (See Comments)    Hx of stomach  ulcers   Penicillins Itching, Palpitations and Other (See Comments)    Flushing (Red Skin) Laryngeal Edema   Reglan  [Metoclopramide ] Other (See Comments)    Tardive dyskinesia    Valium  [Diazepam ] [name]   Zestril [Lisinopril] Anaphylaxis and Swelling    Tongue and mouth swelling Laryngeal Edema   Tolectin [Tolmetin] Nausea And Vomiting, Nausea Only and Other (See Comments)    Irritates stomach ulcer   Aspartame And Phenylalanine Hives   Flexeril  [Cyclobenzaprine ] Palpitations   Hibiclens  [Chlorhexidine  Gluconate] Dermatitis   Oxycontin  [Oxycodone ] Palpitations   Rifamycins Palpitations   Tylenol  [Acetaminophen ] Nausea And Vomiting, Nausea Only and Other (See Comments)    Irritates stomach ulcer Abdominal pain   Ultram   [Tramadol ] Nausea And Vomiting and Palpitations

## 2024-03-17 NOTE — Progress Notes (Signed)
 Afternoon CBC with Hb <7.  Planning to transfuse 1 unit.  Although normal platelet count worry about possibility of DIC with GNR bacteremia.  Check DIC panel.  Leita SHAUNNA Gaskins, DO 03/17/2024 5:39 PM [facility]  For contact information, see Amion. If no response to pager, please call PCCM 2H APP. After hours, 7PM- 7AM, please call on call APP for 2H.

## 2024-03-17 NOTE — ED Notes (Signed)
 Lactic 8.5, NP dagenhart made aware. Pt is also allergic to contrast dye and unable to get CT ordered. NP aware

## 2024-03-17 NOTE — ED Notes (Addendum)
 Pt maxxed on levo per order parameters. NP dagenhart aware, pt BP below 65 still. Pt crying out in pain. Pt has not gotten CT scan, only has oneIV dt restricted extremity and multiple medications needing to be given and not able to be paused. NP to come and put in central line

## 2024-03-17 NOTE — Progress Notes (Signed)
" °  Echocardiogram 2D Echocardiogram has been performed.  Devora Ellouise SAUNDERS 03/17/2024, 8:35 AM "
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
## 2024-03-17 NOTE — ED Notes (Signed)
 CBG 57, this RN gave the ordered 25mL D50, NP dagenhart aware and the full 50mL D50 amp was given per Np.

## 2024-03-17 NOTE — Progress Notes (Addendum)
 Initial Nutrition Assessment  DOCUMENTATION CODES:   Obesity unspecified  INTERVENTION:   Nepro Shake po TID, each supplement provides 425 kcal and 19 grams protein  Rena-vit po daily   Vitamin C  500mg  po BID  Thiamine  100mg  po daily x 7 days   Pt at high refeed risk; recommend monitor potassium, magnesium  and phosphorus labs daily until stable  Daily weights   NUTRITION DIAGNOSIS:   Increased nutrient needs related to chronic illness (ESRD on HD) as evidenced by estimated needs.  GOAL:   Patient will meet greater than or equal to 90% of their needs  MONITOR:   PO intake, Supplement acceptance, Labs, Weight trends, I & O's, Skin  REASON FOR ASSESSMENT:   Consult Assessment of nutrition requirement/status  ASSESSMENT:   59 y/o female with h/o HLD, stroke, fibromyalgia, NICM, HTN, IDDM, gastroparesis, cholecystectomy, substance abuse, gout, GERD, anxiety, PUD, C diff, NASH, duodenal carcinoid s/p resection (2022), chronic pain, PAF, CHF, ESRD on HD, gastric AVM, PVD s/p R BKA, TD and recently hospitalized 02/14/2024-02/25/2024 for sepsis secondary to left thigh abscess and underwent I&D with gen surg 02/16/2024 and who is anow admitted with AMS, syncope, septic shock and K pneumoniae bacteremia.  RD working remotely.  Pt is known to nutrition department from a recent previous admission. Pt with poor appetite and oral intake during her last admission requiring cortrak tube placement and nutrition support. Pt currently on a full liquid diet. RD will add supplements and vitamins to help pt meet her estimated needs. Pt is at high refeed risk. Pt currently on CRRT. Per chart, pt appears fairly weight stable pta; pt's weights have varied slightly likely r/t volume status. RD will obtain history and exam at follow up.   Medications reviewed and include: solu-cortef , midodrine , vancomycin , pepcid , meropenem , levophed , sodium bicarbonate , vancomycin , vasopressin     Labs reviewed: K  4.7 wnl, creat 5.59(H), P 5.5(H) Mg 2.0 wnl- 1/9 Wbc 11.9(H), Hgb 6.8(L), Hct 20.7(L) Cbgs- 141, 139, 138 x 24 hrs  AIC 4.3(L)- 12/10  NUTRITION - FOCUSED PHYSICAL EXAM: Unable to perform at this time   Diet Order:   Diet Order             Diet full liquid Room service appropriate? Yes; Fluid consistency: Thin  Diet effective now                  EDUCATION NEEDS:   Not appropriate for education at this time  Skin:  Skin Assessment: Reviewed RN Assessment (Stage 3 PI b/l medial buttocks, full thickness L groin s/p I&D 02/16/2024, Stage 2 Pressure Injury L posterior thigh pink)  Last BM:  1/10- type 6  Height:   Ht Readings from Last 1 Encounters:  02/16/24 5' 6 (1.676 m)    Weight:   Wt Readings from Last 1 Encounters:  03/17/24 103.1 kg   BMI:  Body mass index is 36.69 kg/m.  Estimated Nutritional Needs:   Kcal:  2100-2400kcal/day  Protein:  105-120g/day  Fluid:  UOP +1L  Augustin Shams MS, RD, LDN If unable to be reached, please send secure chat to RD inpatient available from 8:00a-4:00p daily

## 2024-03-17 NOTE — Progress Notes (Signed)
 CCM crosscover  Called to bedside for high pressor requirements, levo 40, vaso 0.04 Getting albumin  bolus ABG with metabolic acidosis with bicarb of 11.  Given 2 amps now with improvement in BP, will start gtt until more stable - but watching fluid with ESRD/HD requirement.  Crying with pain - PRN dilaudid  ordered but judicious with shock state   Radiology called - previous anaphylactic type reaction with contrast - ordering CT AP without   Tinnie FORBES Furth, PA-C [facility] 03/17/2024 1:52 AM  Please see [email or web address] for pager details. From 7A-7P if no response, please call [phone]

## 2024-03-17 NOTE — Consult Note (Signed)
 WOC Nurse Consult Note: Reason for Consult: sacral wound  Wound type: 1.  Stage 3 Pressure Injuries B medial buttocks 50% red 50% tan  2.  Full thickness L groin post I&D 02/16/2024  3.  Stage 2 Pressure Injury L posterior thigh pink  Pressure Injury POA: Yes Measurement: see nursing flowsheet  Wound bed: as above, scattered areas to B medial buttocks   Drainage (amount, consistency, odor) see nursing flowsheet  Periwound:  Dressing procedure/placement/frequency:  Cleanse buttock wounds with Vashe, to not rinse.  Apply 1/4 thick layer of Santyl  to wound beds daily, cover with saline moist gauze and dry gauze and silicone foam.  Cleanse L groin wound with Vashe, do not rinse.  Apply Vashe moistened gauze to wound bed daily, cover with dry gauze and secure with silicone foam or ABD pad and tape whichever works best.  Cleanse L posterior thigh wound with Vashe, apply Xeroform gauze to wound bed daily, secure with silicone foam or ABD pad and clothe tape whichever is preferred.  POC discussed with bedside nurse.  WOC team will not follow Reconsult if further needs arise.   Thanks,  [employer] MSN, RN-BC, [employer] [phone]  Conservative sharp wound debridement (CSWD performed at the bedside):

## 2024-03-18 ENCOUNTER — Other Ambulatory Visit: Payer: Self-pay

## 2024-03-18 ENCOUNTER — Inpatient Hospital Stay (HOSPITAL_COMMUNITY)

## 2024-03-18 ENCOUNTER — Encounter (HOSPITAL_COMMUNITY): Payer: Self-pay | Admitting: Internal Medicine

## 2024-03-18 DIAGNOSIS — R578 Other shock: Secondary | ICD-10-CM | POA: Diagnosis not present

## 2024-03-18 DIAGNOSIS — G9341 Metabolic encephalopathy: Secondary | ICD-10-CM | POA: Diagnosis not present

## 2024-03-18 DIAGNOSIS — E11649 Type 2 diabetes mellitus with hypoglycemia without coma: Secondary | ICD-10-CM | POA: Diagnosis not present

## 2024-03-18 DIAGNOSIS — E872 Acidosis, unspecified: Secondary | ICD-10-CM | POA: Diagnosis not present

## 2024-03-18 DIAGNOSIS — A419 Sepsis, unspecified organism: Secondary | ICD-10-CM | POA: Diagnosis not present

## 2024-03-18 LAB — CBC
HCT: 23.6 % — ABNORMAL LOW (ref 36.0–46.0)
HCT: 20.5 % — ABNORMAL LOW (ref 36.0–46.0)
Hemoglobin: 7.9 g/dL — ABNORMAL LOW (ref 12.0–15.0)
Hemoglobin: 6.6 g/dL — CL (ref 12.0–15.0)
MCH: 31.5 pg (ref 26.0–34.0)
MCH: 31.9 pg (ref 26.0–34.0)
MCHC: 33.5 g/dL (ref 30.0–36.0)
MCHC: 32.2 g/dL (ref 30.0–36.0)
MCV: 94 fL (ref 80.0–100.0)
MCV: 99 fL (ref 80.0–100.0)
Platelets: 130 K/uL — ABNORMAL LOW (ref 150–400)
Platelets: 93 K/uL — ABNORMAL LOW (ref 150–400)
RBC: 2.51 MIL/uL — ABNORMAL LOW (ref 3.87–5.11)
RBC: 2.07 MIL/uL — ABNORMAL LOW (ref 3.87–5.11)
RDW: 20.8 % — ABNORMAL HIGH (ref 11.5–15.5)
RDW: 20.2 % — ABNORMAL HIGH (ref 11.5–15.5)
WBC: 12.6 K/uL — ABNORMAL HIGH (ref 4.0–10.5)
WBC: 13.1 K/uL — ABNORMAL HIGH (ref 4.0–10.5)
nRBC: 2.7 % — ABNORMAL HIGH (ref 0.0–0.2)
nRBC: 2.3 % — ABNORMAL HIGH (ref 0.0–0.2)

## 2024-03-18 LAB — RENAL FUNCTION PANEL
Albumin: 2.2 g/dL — ABNORMAL LOW (ref 3.5–5.0)
Albumin: 1.7 g/dL — ABNORMAL LOW (ref 3.5–5.0)
Anion gap: 15 (ref 5–15)
Anion gap: 12 (ref 5–15)
BUN: 15 mg/dL (ref 6–20)
BUN: 12 mg/dL (ref 6–20)
CO2: 26 mmol/L (ref 22–32)
CO2: 26 mmol/L (ref 22–32)
Calcium: 7.6 mg/dL — ABNORMAL LOW (ref 8.9–10.3)
Calcium: 7.2 mg/dL — ABNORMAL LOW (ref 8.9–10.3)
Chloride: 92 mmol/L — ABNORMAL LOW (ref 98–111)
Chloride: 96 mmol/L — ABNORMAL LOW (ref 98–111)
Creatinine, Ser: 3.94 mg/dL — ABNORMAL HIGH (ref 0.44–1.00)
Creatinine, Ser: 2.9 mg/dL — ABNORMAL HIGH (ref 0.44–1.00)
GFR, Estimated: 12 mL/min — ABNORMAL LOW
GFR, Estimated: 18 mL/min — ABNORMAL LOW
Glucose, Bld: 180 mg/dL — ABNORMAL HIGH (ref 70–99)
Glucose, Bld: 131 mg/dL — ABNORMAL HIGH (ref 70–99)
Phosphorus: 4.1 mg/dL (ref 2.5–4.6)
Phosphorus: 3 mg/dL (ref 2.5–4.6)
Potassium: 4.3 mmol/L (ref 3.5–5.1)
Potassium: 3.7 mmol/L (ref 3.5–5.1)
Sodium: 133 mmol/L — ABNORMAL LOW (ref 135–145)
Sodium: 134 mmol/L — ABNORMAL LOW (ref 135–145)

## 2024-03-18 LAB — POCT I-STAT EG7
Acid-Base Excess: 5 mmol/L — ABNORMAL HIGH (ref 0.0–2.0)
Bicarbonate: 29.6 mmol/L — ABNORMAL HIGH (ref 20.0–28.0)
Calcium, Ion: 0.98 mmol/L — ABNORMAL LOW (ref 1.15–1.40)
HCT: 26 % — ABNORMAL LOW (ref 36.0–46.0)
Hemoglobin: 8.8 g/dL — ABNORMAL LOW (ref 12.0–15.0)
O2 Saturation: 81 %
Patient temperature: 99.5
Potassium: 4 mmol/L (ref 3.5–5.1)
Sodium: 133 mmol/L — ABNORMAL LOW (ref 135–145)
TCO2: 31 mmol/L (ref 22–32)
pCO2, Ven: 43.4 mmHg — ABNORMAL LOW (ref 44–60)
pH, Ven: 7.443 — ABNORMAL HIGH (ref 7.25–7.43)
pO2, Ven: 45 mmHg (ref 32–45)

## 2024-03-18 LAB — DIC (DISSEMINATED INTRAVASCULAR COAGULATION)PANEL
D-Dimer, Quant: 1.05 ug{FEU}/mL — ABNORMAL HIGH (ref 0.00–0.50)
Fibrinogen: 326 mg/dL (ref 210–475)
INR: 1.8 — ABNORMAL HIGH (ref 0.8–1.2)
Platelets: 94 K/uL — ABNORMAL LOW (ref 150–400)
Prothrombin Time: 21.6 s — ABNORMAL HIGH (ref 11.4–15.2)
Smear Review: NONE SEEN
aPTT: 54 s — ABNORMAL HIGH (ref 24–36)

## 2024-03-18 LAB — GLUCOSE, CAPILLARY
Glucose-Capillary: 159 mg/dL — ABNORMAL HIGH (ref 70–99)
Glucose-Capillary: 160 mg/dL — ABNORMAL HIGH (ref 70–99)
Glucose-Capillary: 145 mg/dL — ABNORMAL HIGH (ref 70–99)
Glucose-Capillary: 133 mg/dL — ABNORMAL HIGH (ref 70–99)
Glucose-Capillary: 139 mg/dL — ABNORMAL HIGH (ref 70–99)
Glucose-Capillary: 119 mg/dL — ABNORMAL HIGH (ref 70–99)

## 2024-03-18 LAB — PREPARE RBC (CROSSMATCH)

## 2024-03-18 LAB — CG4 I-STAT (LACTIC ACID)
Lactic Acid, Venous: 8.2 mmol/L (ref 0.5–1.9)
Lactic Acid, Venous: 1.2 mmol/L (ref 0.5–1.9)

## 2024-03-18 LAB — ECHOCARDIOGRAM LIMITED
Area-P 1/2: 5.27 cm2
S' Lateral: 3.5 cm
Weight: 3636.71 [oz_av]

## 2024-03-18 LAB — CORTISOL: Cortisol, Plasma: 110 ug/dL

## 2024-03-18 LAB — BILIRUBIN, FRACTIONATED(TOT/DIR/INDIR)
Bilirubin, Direct: 0.4 mg/dL — ABNORMAL HIGH (ref 0.0–0.2)
Indirect Bilirubin: 0.3 mg/dL (ref 0.3–0.9)
Total Bilirubin: 0.7 mg/dL (ref 0.0–1.2)

## 2024-03-18 LAB — HEMOGLOBIN AND HEMATOCRIT, BLOOD
HCT: 24.1 % — ABNORMAL LOW (ref 36.0–46.0)
Hemoglobin: 7.7 g/dL — ABNORMAL LOW (ref 12.0–15.0)

## 2024-03-18 LAB — MAGNESIUM: Magnesium: 2.1 mg/dL (ref 1.7–2.4)

## 2024-03-18 LAB — AMMONIA: Ammonia: 31 umol/L (ref 9–35)

## 2024-03-18 LAB — VANCOMYCIN, RANDOM: Vancomycin Rm: 21 ug/mL

## 2024-03-18 LAB — LACTATE DEHYDROGENASE: LDH: 196 U/L (ref 105–235)

## 2024-03-18 MED ORDER — SODIUM CHLORIDE 0.9% FLUSH
3.0000 mL | Freq: Two times a day (BID) | INTRAVENOUS | Status: DC
  Administered 2024-03-19 – 2024-03-21 (×5): 3 mL via INTRAVENOUS
  Administered 2024-03-21: 10 mL via INTRAVENOUS
  Administered 2024-03-22 (×2): 3 mL via INTRAVENOUS

## 2024-03-18 MED ORDER — MIDODRINE HCL 5 MG PO TABS
10.0000 mg | ORAL_TABLET | Freq: Three times a day (TID) | ORAL | Status: DC
  Administered 2024-03-18: 10 mg via ORAL
  Filled 2024-03-18 (×2): qty 2

## 2024-03-18 MED ORDER — FAMOTIDINE IN NACL 20-0.9 MG/50ML-% IV SOLN
20.0000 mg | INTRAVENOUS | Status: DC
  Administered 2024-03-18: 20 mg via INTRAVENOUS
  Filled 2024-03-18: qty 50

## 2024-03-18 MED ORDER — PRISMASOL BGK 4/2.5 32-4-2.5 MEQ/L EC SOLN: Status: DC

## 2024-03-18 MED ORDER — SODIUM CHLORIDE 0.9 % IV SOLN: 250.0000 mL | INTRAVENOUS | Status: AC | PRN

## 2024-03-18 MED ORDER — BUSPIRONE HCL 5 MG PO TABS
5.0000 mg | ORAL_TABLET | Freq: Three times a day (TID) | ORAL | Status: DC
  Administered 2024-03-18: 5 mg via ORAL
  Filled 2024-03-18 (×4): qty 1

## 2024-03-18 MED ORDER — INSULIN ASPART 100 UNIT/ML IJ SOLN
1.0000 [IU] | INTRAMUSCULAR | Status: DC
  Administered 2024-03-18: 2 [IU] via SUBCUTANEOUS
  Administered 2024-03-18 – 2024-03-19 (×7): 1 [IU] via SUBCUTANEOUS
  Administered 2024-03-19 – 2024-03-20 (×2): 2 [IU] via SUBCUTANEOUS
  Administered 2024-03-20: 3 [IU] via SUBCUTANEOUS
  Administered 2024-03-20: 2 [IU] via SUBCUTANEOUS
  Administered 2024-03-20 (×2): 3 [IU] via SUBCUTANEOUS
  Filled 2024-03-18: qty 1
  Filled 2024-03-18: qty 3
  Filled 2024-03-18 (×3): qty 1
  Filled 2024-03-18: qty 3
  Filled 2024-03-18: qty 2
  Filled 2024-03-18 (×2): qty 1
  Filled 2024-03-18: qty 2
  Filled 2024-03-18: qty 3
  Filled 2024-03-18 (×2): qty 2
  Filled 2024-03-18: qty 1

## 2024-03-18 MED ORDER — SODIUM CHLORIDE 0.9% IV SOLUTION: Freq: Once | INTRAVENOUS | Status: AC

## 2024-03-18 MED ORDER — SODIUM CHLORIDE 0.9% FLUSH: 3.0000 mL | INTRAVENOUS | Status: DC | PRN

## 2024-03-18 NOTE — Plan of Care (Signed)
" °  Problem: Education: Goal: Knowledge of General Education information will improve Description: Including pain rating scale, medication(s)/side effects and non-pharmacologic comfort measures Outcome: Not Progressing   Problem: Health Behavior/Discharge Planning: Goal: Ability to manage health-related needs will improve Outcome: Not Progressing   Problem: Clinical Measurements: Goal: Ability to maintain clinical measurements within normal limits will improve Outcome: Progressing Goal: Will remain free from infection Outcome: Not Progressing Goal: Diagnostic test results will improve Outcome: Progressing Goal: Respiratory complications will improve Outcome: Progressing Goal: Cardiovascular complication will be avoided Outcome: Progressing   Problem: Activity: Goal: Risk for activity intolerance will decrease Outcome: Not Progressing   Problem: Nutrition: Goal: Adequate nutrition will be maintained Outcome: Not Progressing   Problem: Coping: Goal: Level of anxiety will decrease Outcome: Not Progressing   Problem: Elimination: Goal: Will not experience complications related to bowel motility Outcome: Progressing Goal: Will not experience complications related to urinary retention Outcome: Progressing   Problem: Pain Managment: Goal: General experience of comfort will improve and/or be controlled Outcome: Progressing   Problem: Safety: Goal: Ability to remain free from injury will improve Outcome: Progressing   Problem: Skin Integrity: Goal: Risk for impaired skin integrity will decrease Outcome: Progressing   "

## 2024-03-18 NOTE — Progress Notes (Signed)
 PHARMACY ANTIBIOTIC CONSULT NOTE   Deborah [name] a 59 y.o. female admitted on 03/17/2023 from SNF after syncopal episode with LOC, to ICU with presumed septic shock requiring vasopressors. ESRD on iHD TTS PTA, currently on CRRT (Started 1/10) with some residual UOP. Pt with ESBL Kleb PNA growing in blood and likely GPR without speciation. Pharmacy has been consulted for vancomycin , merrem  dosing. Recent admit 12/9-20 for sepsis secondary to L thigh abscess s/p I&D 12/11.   LD Vancomycin  1g IV 1/10 1604. Vancomycin  random level 1/11 19:38 within therapeutic range at 21 mcg/mL.   Estimated Creatinine Clearance: 25.3 mL/min (A) (by C-G formula based on SCr of 2.9 mg/dL (H)).  Plan: Merrem  1g IV Q8h  Hold further Vancomycin  doses, recheck VR in AM  Oral vancomycin  ppx 125 PO/PT mg daily to prevent C diff recurrence (continue until broad spec abx stopped) Monitor RRT, clinical status, de-escalation, C/S, levels as indicated   Allergies:  Allergies[1]  Filed Weights   03/17/24 0100 03/18/24 0500  Weight: 103.1 kg (227 lb 4.7 oz) 103.1 kg (227 lb 4.7 oz)       Latest Ref Rng & Units 03/18/2024    4:39 PM 03/18/2024    3:35 PM 03/18/2024    6:49 AM  CBC  WBC 4.0 - 10.5 K/uL  13.1    Hemoglobin 12.0 - 15.0 g/dL  6.6  8.8   Hematocrit 36.0 - 46.0 %  20.5  26.0   Platelets 150 - 400 K/uL 94  93      Antibiotics Given (last 72 hours)     Date/Time Action Medication Dose Rate   03/16/24 2004 New Bag/Given   ceFEPIme  (MAXIPIME ) 2 g in sodium chloride  0.9 % 100 mL IVPB 2 g 200 mL/hr   03/16/24 2047 New Bag/Given   vancomycin  (VANCOREADY) IVPB 2000 mg/400 mL 2,000 mg 200 mL/hr   03/17/24 0643 New Bag/Given   metroNIDAZOLE  (FLAGYL ) IVPB 500 mg 500 mg 100 mL/hr   03/17/24 0954 New Bag/Given   meropenem  (MERREM ) 500 mg in sodium chloride  0.9 % 100 mL IVPB 500 mg 200 mL/hr   03/17/24 1213 Given   vancomycin  (VANCOCIN ) 50 mg/mL oral solution SOLN 125 mg 125 mg    03/17/24 1459 New  Bag/Given   meropenem  (MERREM ) 1 g in sodium chloride  0.9 % 100 mL IVPB 1 g 200 mL/hr   03/17/24 1501 Given   vancomycin  (VANCOCIN ) capsule 125 mg 125 mg    03/17/24 1604 New Bag/Given   vancomycin  (VANCOCIN ) IVPB 1000 mg/200 mL premix 1,000 mg 200 mL/hr   03/17/24 2141 New Bag/Given   meropenem  (MERREM ) 1 g in sodium chloride  0.9 % 100 mL IVPB 1 g 200 mL/hr   03/18/24 0544 New Bag/Given   meropenem  (MERREM ) 1 g in sodium chloride  0.9 % 100 mL IVPB 1 g 200 mL/hr   03/18/24 1126 Given   vancomycin  (VANCOCIN ) 50 mg/mL oral solution SOLN 125 mg 125 mg    03/18/24 1302 New Bag/Given   meropenem  (MERREM ) 1 g in sodium chloride  0.9 % 100 mL IVPB 1 g 200 mL/hr       Antimicrobials this admission: CFP/MTZ 1/9 x1 Merrem  1/10 > c Vancomycin  IV 1/9 >1/10; 1/11>>  PO vancomycin  daily (C diff ppx) 1/10 > c  Microbiology results: 1/9 Bcx: Klebsiella pneumoniae (ESBL), GPR  1/10 MRSA PCR: negative 12/11 BCx neg, 12/11 fluid path with moderate proteus mirabilis, few enterococcus avium, few prevotella buccae (BL-positive) C diff panel negative   Thank you  for allowing pharmacy to be a part of this patients care.  Massie Fila, PharmD Clinical Pharmacist  03/18/2024 6:57 PM         [1]  Allergies Allergen Reactions   Dorethia Cromer ] Other (See Comments)    Hx of stomach ulcer   Bentyl  [Dicyclomine ] Other (See Comments)    Chest pain   Gabapentin  Hives and [name]   Isovue  [Iopamidol ] Anaphylaxis, [name] and Other (See Comments)    11/28/17 Patient had seizure like activity and then 1 min code after 100 cc of isovue  300. Possible contrast allergy  vs vasovagal episode  Cardiac Arrest   Nsaids Anaphylaxis and Other (See Comments)    Hx of stomach ulcers   Penicillins Itching, Palpitations and Other (See Comments)    Flushing (Red Skin) Laryngeal Edema   Reglan  [Metoclopramide ] Other (See Comments)    Tardive dyskinesia    Valium  [Diazepam ] Shortness Of  Breath   Zestril [Lisinopril] Anaphylaxis and Swelling    Tongue and mouth swelling Laryngeal Edema   Tolectin [Tolmetin] Nausea And Vomiting, Nausea Only and Other (See Comments)    Irritates stomach ulcer   Heparin      HIT   Aspartame And Phenylalanine Hives   Flexeril  [Cyclobenzaprine ] Palpitations   Hibiclens  [Chlorhexidine  Gluconate] Dermatitis   Oxycontin  [Oxycodone ] Palpitations   Rifamycins Palpitations   Tylenol  [Acetaminophen ] Nausea And Vomiting, Nausea Only and Other (See Comments)    Irritates stomach ulcer Abdominal pain   Ultram  [Tramadol ] Nausea And Vomiting and Palpitations

## 2024-03-18 NOTE — Progress Notes (Signed)
 "  NAME:  Deborah [name], MRN:  [id number], DOB:  1966-02-02, LOS: 2 ADMISSION DATE:  03/16/2024, CONSULTATION DATE:  03/16/24 REFERRING MD:  Guillermina RAMAN., CHIEF COMPLAINT:  Syncopal event; +LOC; shock   History of Present Illness:  Deborah [name] is a 59 yo female with past medical history significant for PAF, HLD, right BKA, ESRD on HD (TTS) who presented from SNF to [facility] after syncopal episode with +LOC. Patient hemodynamically unstable in ED despite fluid resuscitation with 3L crystalloid and was subsequently started on vasopressors. Labs notable for metabolic acidosis, sCr 7.04, BUN 23, iCa 1.00, troponin 105, lactic acic 9.0, given 1 amp HCO3. On exam patient with diffuse abdominal pain, exam somewhat limited as she has chronic pain managed daily with Dilaudid . Patient started empirically on Cefepime , Vanc, Flagyl ; CT A/P pending, and syncope workup in progress. PCCM consulted for ICU admission.  Of note, patient recently hospitalized 02/14/2024-02/25/2024 for sepsis secondary to left thigh abscess and underwent I&D with gen surg 02/16/2024.  Pertinent Medical History:   Past Medical History:  Diagnosis Date   Acute back pain with sciatica, left    Acute back pain with sciatica, right    Acute encephalopathy 05/29/2022   Acute osteomyelitis of right calcaneus (HCC) 12/21/2022   Anemia, unspecified    Atrial fibrillation with RVR (HCC)-resolved 09/09/2022   Atypical chest pain 09/10/2021   Cancer (HCC)    Carcinoid tumor of duodenum (HCC)    Chest pain with normal coronary angiography 2019   Chronic a-fib (HCC) 09/09/2022   Chronic pain    Chronic systolic CHF (congestive heart failure) (HCC)    Dehiscence of amputation stump of right lower extremity (HCC) 01/29/2023   Diabetes mellitus    DKA (diabetic ketoacidosis) (HCC)    Drug-seeking behavior    21 hospitalizations and 14 CT a/p in 2 years for N/V and abdominal pain, demanding only IV dilaudid    Elevated troponin     chronic   Esophageal reflux    Fibromyalgia    Gastric ulcer    Gastroparesis    Gout    HCAP (healthcare-associated pneumonia) 06/19/2022   Hyperlipidemia    Hyperosmolar hyperglycemic state (HHS) (HCC) 05/11/2022   Hyperosmolar non-ketotic state due to type 2 diabetes mellitus (HCC) 05/11/2022   Hypertension    Hypomagnesemia    Lumbosacral stenosis    LVH (left ventricular hypertrophy)    Morbid obesity (HCC)    Nausea & vomiting 09/09/2022   NICM (nonischemic cardiomyopathy) (HCC)    PAF (paroxysmal atrial fibrillation) (HCC)    Sepsis (HCC) 11/23/2022   Stroke (HCC) 02/2011   Symptomatic anemia 12/14/2022   Thrombocytosis    Vitamin B12 deficiency anemia    Significant Hospital Events: Including procedures, antibiotic start and stop dates in addition to other pertinent events   1/9 ED s/p witnessed syncopal event w/ +LOC in shock, started on Levo/Vaso; Aline/CVC placed 1/10 CRRT, still on vaso, NE. Esclated to meropenem  for ESBL Klebsiella  Interim History / Subjective:  Still on vaos 0.04, NE down to 7 mcg. More lethargic overnight, still waking up and yelling periodically though.  Objective    Blood pressure 95/82, pulse 99, temperature 98.2 F (36.8 C), temperature source Axillary, resp. rate 11, weight 103.1 kg, last menstrual period 10/10/2012, SpO2 95%.        Intake/Output Summary (Last 24 hours) at 03/18/2024 0834 Last data filed at 03/18/2024 0800 Gross per 24 hour  Intake 1809.33 ml  Output 1865.1 ml  Net -55.77 ml  Filed Weights   03/17/24 0100 03/18/24 0500  Weight: 103.1 kg 103.1 kg    Examination: General: chronically ill appearing man lying in bed in NAD HEENT: [place]/AT, eyes anicteric Pulm: breathing comfortably on RA, no accessory muscle use, distant lung sounds CV: S1S2, RRR GI: obese, soft, NT Extremities:  R BKA, L chronic venous stasis changes, heel wound covered. R fem CVC. Neuro: more lethargic but startles awake some, not answering  questions today. Still yelling some.   VBG 7.44/43/45/30 LA 4.1 > K+ 4.3 Bicarb 26 BUN 15, Cr  3.94 on CRRT WBC 12.6 H/H 7.9/23.6 Platelets 130 DIC panel- no schistocytes,  Blood cultures: ESBL K pneumoniae 4/4 bottles>   Resolved Problem List:   Assessment and Plan:   Septic shock due to K pneumoniae bacteremia- slow to clear lactate due to poor antibiotic coverage today Acute metabolic encephalopathy due to sepsis Lactic acidosis due to sepsis Hypoglycemia due to sepsis -con't meropenem , ok stopping IV vanc today -bladder scan; send urine for culture if able -con't PO vanc to decrease chances of recurrent C diff -con't stress dose steroids -vasopressors as needed to maintain MAP >65; con't NE & vasopressin . Weaning over the past day.  -con't CVC for pressors -repeat LA this morning; has been slowly downtrending  Concern for C diff, has a history, having diarrhea -empiric PO vanc to decrease risk of recurrence  Syncope; suspect due to sepsis -echo pending today -tele  Troponin elevation; likely demand d/t shock state PAF-not on AC -holding AC -treat sepsis  Acute metabolic encephalopathy due to sepsis, ICU delirium -holding PTA robaxin  -hold sedating meds -VBG not concerning for hypercapnia -check ammonia -CRRT for metabolic clearance -sleep hygiene -treat sepsis -cortrak ordered for tomorrow  HLD -resume PTA statin  ESRD on HD (TTS) Chronic hypotension-takes midodrine  before dialysis; however reports taking daily hyperphosphatemia -con't CRRT-- has permacath from PTA -con't midodrine  10mg  q8h -vasopressors to maintain MAP >65- NE, vaso. Wean NE first. -renally dose meds -appreciate nephrology's management  Acute abdominal pain Hx duodenal carcinoid tumor s/p resection (2022) Hx gastric ulcer & gastritis (2024) Chronic gastroparesis -con't H2-blocker; trying to avoid PPI due to C diff risk on broad spectrum antibiotics -PRN pain meds  Anemia of  chronic disease; drop in Hb may be dilutional. No evidence of DIC or bleeding. -daily CBC -holding AC -transfuse for Hb <7 or hemodynamically significant bleeding  DM2; hypoglycemia from sepsis corrected with steroids -SSI PRN -goal BG 140-180  PVD s/p R BKA- stump is well healed -PT & OT once able to participate; not sure what baseline mobility is with baseline SNF residence  Sacral pressure injury -turns -unfortunately needs pressors -hard to keep that skin clear with frequent diarrhea  Possible vaginal yeast infection -Clotrimazole  cream daily x7 days  Anxiety/Depression -Resume home buspar , risperidone , trazodone  when appropriate> waiting for mental status to normalize some  Chronic pain Hx fibromyalgia S/p I&D for left thigh abscess 02/16/24 -on dilaudid  daily since prior admission -MMPR; schedule tylenol , robaxin ; oxy PRN; dilaudid  for breakthrough pain -Administration of IV analgesics will be limited and carefully titrated during this acute phase  Drug-seeking behavior Opioid-use disorder Hx substance abuse-- as an OP following with [facility] -dilaudid  PRN; can deescalate as she improves  Wounds-- left thigh, R hip, lower abdomen, buttocks, L heel -turns, trying to get off pressors -needs nutrition once able  No family at bedside. Full code; mother is runner, broadcasting/film/video.   Planning   Labs:  CBC: Recent Labs  Lab 03/16/24 1711 03/16/24 1754 03/17/24 0258 03/17/24 0828 03/17/24 1629 03/17/24 1835 03/17/24 2309 03/18/24 0447 03/18/24 0649  WBC 11.6*  --  9.6 10.7* 11.9*  --  12.4* 12.6*  --   NEUTROABS 10.1*  --   --   --   --   --   --   --   --   HGB 7.6*   < > 8.4* 7.4* 6.8*  --  8.0* 7.9* 8.8*  HCT 23.3*   < > 25.8* 21.9* 20.7*  --  24.4* 23.6* 26.0*  MCV 96.3  --  95.6 92.4 94.5  --  95.7 94.0  --   PLT 145*  --  176 163 152 152 135* 130*  --    < > = values in this interval not displayed.    Basic Metabolic Panel: Recent Labs   Lab 03/16/24 1711 03/16/24 1754 03/17/24 0258 03/17/24 0828 03/17/24 1629 03/18/24 0447 03/18/24 0649  NA 135   < > 135 135 135 133* 133*  K 4.2   < > 4.5 4.5 4.7 4.3 4.0  CL 97*  --  97* 96* 96* 92*  --   CO2 14*  --  12* 14* 19* 26  --   GLUCOSE 75  --  141* 131* 168* 180*  --   BUN 23*  --  22* 22* 19 15  --   CREATININE 7.04*  --  6.60* 6.50* 5.59* 3.94*  --   CALCIUM  7.8*  --  8.3* 8.1* 7.7* 7.6*  --   MG 2.0  --   --   --   --  2.1  --   PHOS 6.3*  --   --  6.1* 5.5* 4.1  --    < > = values in this interval not displayed.   GFR: Estimated Creatinine Clearance: 18.6 mL/min (A) (by C-G formula based on SCr of 3.94 mg/dL (H)). Recent Labs  Lab 03/17/24 0122 03/17/24 0258 03/17/24 9360 03/17/24 0828 03/17/24 1627 03/17/24 1629 03/17/24 2309 03/18/24 0447  WBC  --    < >  --  10.7*  --  11.9* 12.4* 12.6*  LATICACIDVEN 9.2*  --  8.2* 7.3* 4.1*  --   --   --    < > = values in this interval not displayed.     Critical care time:      This patient is critically ill with multiple organ system failure which requires frequent high complexity decision making, assessment, support, evaluation, and titration of therapies. This was completed through the application of advanced monitoring technologies and extensive interpretation of multiple databases. During this encounter critical care time was devoted to patient care services described in this note for 41 minutes.  Deborah SHAUNNA Gaskins, DO 03/18/2024 9:05 AM [facility]  For contact information, see Amion. If no response to pager, please call PCCM consult pager. After hours, 7PM- 7AM, please call Elink.    "

## 2024-03-18 NOTE — Progress Notes (Addendum)
 Hb <7 again, platelets still dropping. 1 unit pRBC. No discernable source of bleeding. Question if she is losing blood in CRRT circuit, but hasn't needed a filter change yet.  Admission abdominal CT didn't show signs of bleeding; disinclined to repeat CT right now with periodic agitation and ongoing need for CRRT. Can recheck DIC panel, LDH, LFTs, HIT Ab.  Leita SHAUNNA Gaskins, DO 03/18/2024 4:13 PM [facility]

## 2024-03-18 NOTE — Plan of Care (Signed)
  Problem: Clinical Measurements: Goal: Diagnostic test results will improve Outcome: Progressing Goal: Respiratory complications will improve Outcome: Progressing   Problem: Elimination: Goal: Will not experience complications related to bowel motility Outcome: Progressing Goal: Will not experience complications related to urinary retention Outcome: Progressing   

## 2024-03-18 NOTE — Progress Notes (Signed)
 Now has GPC in blood culture in addition to more significant GNR growth. With wounds as a likely source for her bacteremia, polymicrobial infection is concerning. D/w pharmacy, planning to add back vanc to give more time for cultures to mature.  Deborah SHAUNNA Gaskins, DO 03/18/2024 6:36 PM [facility]  For contact information, see Amion. If no response to pager, please call PCCM consult pager. After hours, 7PM- 7AM, please call Elink.

## 2024-03-18 NOTE — Progress Notes (Signed)
 eLink Physician-Brief Progress Note Patient Name: Deborah [name] DOB: 02-01-1966 MRN: [id number]   Date of Service  03/18/2024  HPI/Events of Note   refusing all PO meds, including Vanc PO  eICU Interventions  Renal diet tolerated, encourage med compliance     Intervention Category Minor Interventions: Routine modifications to care plan (e.g. PRN medications for pain, fever)  Trinady Milewski 03/18/2024, 10:08 PM

## 2024-03-18 NOTE — Progress Notes (Signed)
 Pt transferred from 2H26 to 3M09 without issue. Mother made aware of trasnfer.

## 2024-03-18 NOTE — Progress Notes (Signed)
 [facility] Progress Note  Subjective:  Tolerating CRRT I/O's were equal yesterday Labs stable, on RA, BP's soft Levo gtt 7-12 micrograms/min today, vaso at 0.04  Presentation summary:  59 y.o. year-old w/ PMH sig for atrial fib, chronic syst HF, DM hx of DKA, HTN, morbid obesity, gastroparesis, hx CVA, anemia who presented to ED last evening sent from SNF for 2 syncopal episodes observed by staff. TTS HD. Was very lethargic on arrival. In ED bp's were soft, POCUS showed flat IVC and IVF's were given. Low bp's felt possibly due to sepsis and IVF's and IV abx were given. CXR was negative, K+ 4.5, bun 22, creat 6.6, wBC 9K, Hb 8.4, plts wnl.  Pt was admitted to ICU and vasopressors were required. Pt rec'd IV bicarb gtt for acidemia w/ pH 7.2 on VBG. Renal team consulted for need for RRT. This am levo gtt is 10 micrograms/ min and vaso at 0.04 mcg/min. D/w CCM MD that acidemia treatment seemed to improved BP's and that CRRT should be considered for metabolic acidosis assoc w/ shock state. Pt provided no hx.   Vitals:   03/18/24 0700 03/18/24 0715 03/18/24 0730 03/18/24 0745  BP: 111/60 (!) 106/58 (!) 76/63 95/82  Pulse: 96 95 99   Resp: 17 11 18 11   Temp:      TempSrc:      SpO2: 97% 100% 95%   Weight:        Exam: Gen lethargic, screams out loud if her legs are touched Sclera anicteric, throat clear  No jvd or bruits Chest clear bilat to bases RRR no MRG Abd soft ntnd no mass or ascites +bs, obesity Ext 2+ bilat LE edema from ankles to hips Neuro as above, nonfocal    RIJ TDC intact   Home bp meds: Midodrine  10 mg pre HD TTS    OP HD: [place] TTS 4h  B400   96.1kg  TDC   Hep none Last OP HD 1/08, post wt 100kg     Assessment/ Plan:   # ESRD - on HD TTS - CRRT started 1/10 due to sig shock state and met acidosis  - cont CRRT     # shock/ hypotension - remains on 2 vasopressors - getting po midodrine  10 tid - GNR bacteremia likely the cause     #  Volume - came off 4kg over last OP HD - diffuse 2+ LE edema - CXR clear, doubt intravasc vol overload - keeping even w/ CRRT   # Anemia of esrd - Hb 7-10 here - follow, transfuse prn   # Gram neg bacteremia - K. Pneumoniae sepsis - keep RIJ TDC for now    # diarrhea - getting empiric po vanc - having diarrhea and concern for Cdif, testing pend     Myer Fret MD  [facility] 03/18/2024, 8:20 AM  Recent Labs  Lab 03/17/24 1629 03/17/24 2309 03/18/24 0447 03/18/24 0649  HGB 6.8*   < > 7.9* 8.8*  ALBUMIN  2.0*  --  2.2*  --   CALCIUM  7.7*  --  7.6*  --   PHOS 5.5*  --  4.1  --   CREATININE 5.59*  --  3.94*  --   K 4.7  --  4.3 4.0   < > = values in this interval not displayed.   No results for input(s): IRON , TIBC, FERRITIN in the last 168 hours. Inpatient medications:  acetaminophen   1,000 mg Oral Q8H   ascorbic acid   500 mg  Oral BID   atorvastatin   10 mg Oral Daily   clotrimazole   1 Applicatorful Vaginal QHS   collagenase    Topical Daily   feeding supplement (NEPRO CARB STEADY)  237 mL Oral TID BM   hydrocortisone  sod succinate (SOLU-CORTEF ) inj  100 mg Intravenous Q12H   midodrine   10 mg Per Tube Q8H   multivitamin  1 tablet Oral QHS   sodium chloride  flush  10-40 mL Intracatheter Q12H   thiamine   100 mg Oral Daily   vancomycin   125 mg Per Tube Daily   Or   vancomycin   125 mg Oral Daily    famotidine  (PEPCID ) IV Stopped (03/17/24 2251)   meropenem  (MERREM ) IV Stopped (03/18/24 9385)   norepinephrine  (LEVOPHED ) Adult infusion 7 mcg/min (03/18/24 0800)   prismasol  BGK 4/2.5 1,500 mL/hr at 03/18/24 9376   sodium bicarbonate  150 mEq in sterile water  1,150 mL infusion 150 mL/hr at 03/18/24 0209   sodium bicarbonate  150 mEq in sterile water  1,150 mL infusion 150 mL/hr at 03/18/24 0210   vancomycin  Stopped (03/17/24 1704)   vasopressin  0.04 Units/min (03/18/24 0800)   heparin , HYDROmorphone  (DILAUDID ) injection, oxyCODONE  **OR** oxyCODONE , polyethylene glycol,  senna, sodium chloride  flush

## 2024-03-19 DIAGNOSIS — E1122 Type 2 diabetes mellitus with diabetic chronic kidney disease: Secondary | ICD-10-CM

## 2024-03-19 DIAGNOSIS — I480 Paroxysmal atrial fibrillation: Secondary | ICD-10-CM | POA: Diagnosis not present

## 2024-03-19 DIAGNOSIS — N186 End stage renal disease: Secondary | ICD-10-CM | POA: Diagnosis not present

## 2024-03-19 DIAGNOSIS — R7881 Bacteremia: Secondary | ICD-10-CM

## 2024-03-19 DIAGNOSIS — B961 Klebsiella pneumoniae [K. pneumoniae] as the cause of diseases classified elsewhere: Secondary | ICD-10-CM

## 2024-03-19 DIAGNOSIS — A419 Sepsis, unspecified organism: Secondary | ICD-10-CM | POA: Diagnosis not present

## 2024-03-19 DIAGNOSIS — E1151 Type 2 diabetes mellitus with diabetic peripheral angiopathy without gangrene: Secondary | ICD-10-CM | POA: Diagnosis not present

## 2024-03-19 DIAGNOSIS — R6521 Severe sepsis with septic shock: Secondary | ICD-10-CM | POA: Diagnosis not present

## 2024-03-19 DIAGNOSIS — I120 Hypertensive chronic kidney disease with stage 5 chronic kidney disease or end stage renal disease: Secondary | ICD-10-CM

## 2024-03-19 DIAGNOSIS — D631 Anemia in chronic kidney disease: Secondary | ICD-10-CM | POA: Diagnosis not present

## 2024-03-19 DIAGNOSIS — F418 Other specified anxiety disorders: Secondary | ICD-10-CM | POA: Diagnosis not present

## 2024-03-19 DIAGNOSIS — G9341 Metabolic encephalopathy: Secondary | ICD-10-CM | POA: Diagnosis not present

## 2024-03-19 DIAGNOSIS — I9589 Other hypotension: Secondary | ICD-10-CM | POA: Diagnosis not present

## 2024-03-19 DIAGNOSIS — G9340 Encephalopathy, unspecified: Secondary | ICD-10-CM

## 2024-03-19 LAB — CULTURE, BLOOD (ROUTINE X 2): Special Requests: ADEQUATE

## 2024-03-19 LAB — GLUCOSE, CAPILLARY
Glucose-Capillary: 102 mg/dL — ABNORMAL HIGH (ref 70–99)
Glucose-Capillary: 127 mg/dL — ABNORMAL HIGH (ref 70–99)
Glucose-Capillary: 133 mg/dL — ABNORMAL HIGH (ref 70–99)
Glucose-Capillary: 137 mg/dL — ABNORMAL HIGH (ref 70–99)
Glucose-Capillary: 149 mg/dL — ABNORMAL HIGH (ref 70–99)
Glucose-Capillary: 155 mg/dL — ABNORMAL HIGH (ref 70–99)

## 2024-03-19 LAB — RENAL FUNCTION PANEL
Albumin: 1.9 g/dL — ABNORMAL LOW (ref 3.5–5.0)
Albumin: 1.8 g/dL — ABNORMAL LOW (ref 3.5–5.0)
Anion gap: 15 (ref 5–15)
Anion gap: 12 (ref 5–15)
BUN: 11 mg/dL (ref 6–20)
BUN: 10 mg/dL (ref 6–20)
CO2: 23 mmol/L (ref 22–32)
CO2: 22 mmol/L (ref 22–32)
Calcium: 7.4 mg/dL — ABNORMAL LOW (ref 8.9–10.3)
Calcium: 7.5 mg/dL — ABNORMAL LOW (ref 8.9–10.3)
Chloride: 99 mmol/L (ref 98–111)
Chloride: 100 mmol/L (ref 98–111)
Creatinine, Ser: 2.1 mg/dL — ABNORMAL HIGH (ref 0.44–1.00)
Creatinine, Ser: 1.55 mg/dL — ABNORMAL HIGH (ref 0.44–1.00)
GFR, Estimated: 27 mL/min — ABNORMAL LOW
GFR, Estimated: 38 mL/min — ABNORMAL LOW
Glucose, Bld: 138 mg/dL — ABNORMAL HIGH (ref 70–99)
Glucose, Bld: 160 mg/dL — ABNORMAL HIGH (ref 70–99)
Phosphorus: 2.4 mg/dL — ABNORMAL LOW (ref 2.5–4.6)
Phosphorus: 3.2 mg/dL (ref 2.5–4.6)
Potassium: 3.7 mmol/L (ref 3.5–5.1)
Potassium: 4 mmol/L (ref 3.5–5.1)
Sodium: 136 mmol/L (ref 135–145)
Sodium: 134 mmol/L — ABNORMAL LOW (ref 135–145)

## 2024-03-19 LAB — CBC
HCT: 23.5 % — ABNORMAL LOW (ref 36.0–46.0)
Hemoglobin: 7.6 g/dL — ABNORMAL LOW (ref 12.0–15.0)
MCH: 30.8 pg (ref 26.0–34.0)
MCHC: 32.3 g/dL (ref 30.0–36.0)
MCV: 95.1 fL (ref 80.0–100.0)
Platelets: 82 K/uL — ABNORMAL LOW (ref 150–400)
RBC: 2.47 MIL/uL — ABNORMAL LOW (ref 3.87–5.11)
RDW: 21 % — ABNORMAL HIGH (ref 11.5–15.5)
WBC: 14.1 K/uL — ABNORMAL HIGH (ref 4.0–10.5)
nRBC: 2 % — ABNORMAL HIGH (ref 0.0–0.2)

## 2024-03-19 LAB — HEPATIC FUNCTION PANEL
ALT: <5 U/L (ref 0–44)
AST: 15 U/L (ref 15–41)
Albumin: 1.9 g/dL — ABNORMAL LOW (ref 3.5–5.0)
Alkaline Phosphatase: 75 U/L (ref 38–126)
Bilirubin, Direct: 0.4 mg/dL — ABNORMAL HIGH (ref 0.0–0.2)
Indirect Bilirubin: 0.3 mg/dL (ref 0.3–0.9)
Total Bilirubin: 0.7 mg/dL (ref 0.0–1.2)
Total Protein: 4.4 g/dL — ABNORMAL LOW (ref 6.5–8.1)

## 2024-03-19 LAB — MAGNESIUM: Magnesium: 2.1 mg/dL (ref 1.7–2.4)

## 2024-03-19 LAB — HEPATITIS B SURFACE ANTIBODY, QUANTITATIVE: Hep B S AB Quant (Post): 318 m[IU]/mL

## 2024-03-19 LAB — VANCOMYCIN, RANDOM: Vancomycin Rm: 19 ug/mL

## 2024-03-19 MED ORDER — ATORVASTATIN CALCIUM 10 MG PO TABS
10.0000 mg | ORAL_TABLET | Freq: Every day | ORAL | Status: DC
  Administered 2024-03-19 – 2024-03-24 (×5): 10 mg
  Filled 2024-03-19 (×5): qty 1

## 2024-03-19 MED ORDER — POTASSIUM PHOSPHATES 15 MMOLE/5ML IV SOLN
15.0000 mmol | Freq: Once | INTRAVENOUS | Status: AC
  Administered 2024-03-19: 15 mmol via INTRAVENOUS
  Filled 2024-03-19: qty 5

## 2024-03-19 MED ORDER — THIAMINE MONONITRATE 100 MG PO TABS
100.0000 mg | ORAL_TABLET | Freq: Every day | ORAL | Status: AC
  Administered 2024-03-19 – 2024-03-24 (×5): 100 mg
  Filled 2024-03-19 (×5): qty 1

## 2024-03-19 MED ORDER — VITAL 1.5 CAL PO LIQD
1000.0000 mL | ORAL | Status: DC
  Administered 2024-03-19 – 2024-03-20 (×2): 1000 mL
  Filled 2024-03-19 (×4): qty 1000

## 2024-03-19 MED ORDER — VANCOMYCIN HCL IN DEXTROSE 1-5 GM/200ML-% IV SOLN: 1000.0000 mg | Freq: Once | INTRAVENOUS | Status: DC

## 2024-03-19 MED ORDER — BUSPIRONE HCL 5 MG PO TABS
5.0000 mg | ORAL_TABLET | Freq: Three times a day (TID) | ORAL | Status: DC
  Administered 2024-03-19 – 2024-03-24 (×17): 5 mg
  Filled 2024-03-19 (×19): qty 1

## 2024-03-19 MED ORDER — ACETAMINOPHEN 325 MG PO TABS
650.0000 mg | ORAL_TABLET | Freq: Four times a day (QID) | ORAL | Status: DC | PRN
  Administered 2024-03-21 – 2024-03-22 (×2): 650 mg
  Filled 2024-03-19 (×2): qty 2

## 2024-03-19 MED ORDER — VANCOMYCIN HCL IN DEXTROSE 1-5 GM/200ML-% IV SOLN
1000.0000 mg | INTRAVENOUS | Status: DC
  Administered 2024-03-19: 1000 mg via INTRAVENOUS
  Filled 2024-03-19: qty 200

## 2024-03-19 MED ORDER — MIDODRINE HCL 5 MG PO TABS
10.0000 mg | ORAL_TABLET | Freq: Three times a day (TID) | ORAL | Status: DC
  Administered 2024-03-19 – 2024-03-24 (×17): 10 mg
  Filled 2024-03-19 (×16): qty 2

## 2024-03-19 MED ORDER — DARBEPOETIN ALFA 150 MCG/0.3ML IJ SOSY
150.0000 ug | PREFILLED_SYRINGE | INTRAMUSCULAR | Status: DC
  Administered 2024-03-20 – 2024-03-27 (×2): 150 ug via SUBCUTANEOUS
  Filled 2024-03-19 (×3): qty 0.3

## 2024-03-19 MED ORDER — VITAL HP 1.0 CAL PO LIQD: 1000.0000 mL | ORAL | Status: DC

## 2024-03-19 MED ORDER — VITAMIN C 500 MG PO TABS
500.0000 mg | ORAL_TABLET | Freq: Two times a day (BID) | ORAL | Status: DC
  Administered 2024-03-19 – 2024-03-24 (×11): 500 mg
  Filled 2024-03-19 (×11): qty 1

## 2024-03-19 MED ORDER — ACETAMINOPHEN 500 MG PO TABS
1000.0000 mg | ORAL_TABLET | Freq: Three times a day (TID) | ORAL | Status: DC
  Filled 2024-03-19: qty 2

## 2024-03-19 MED ORDER — RENA-VITE PO TABS
1.0000 | ORAL_TABLET | Freq: Every day | ORAL | Status: DC
  Administered 2024-03-19 – 2024-03-22 (×4): 1
  Filled 2024-03-19 (×5): qty 1

## 2024-03-19 MED ORDER — FAMOTIDINE 20 MG PO TABS
20.0000 mg | ORAL_TABLET | Freq: Every day | ORAL | Status: DC
  Administered 2024-03-19 – 2024-03-24 (×6): 20 mg
  Filled 2024-03-19 (×6): qty 1

## 2024-03-19 NOTE — Plan of Care (Signed)
" °  Problem: Clinical Measurements: Goal: Ability to maintain clinical measurements within normal limits will improve Outcome: Progressing Goal: Will remain free from infection Outcome: Progressing Goal: Diagnostic test results will improve Outcome: Progressing Goal: Respiratory complications will improve Outcome: Progressing Goal: Cardiovascular complication will be avoided Outcome: Progressing   Problem: Nutrition: Goal: Adequate nutrition will be maintained Outcome: Progressing   Problem: Pain Managment: Goal: General experience of comfort will improve and/or be controlled Outcome: Progressing   Problem: Safety: Goal: Ability to remain free from injury will improve Outcome: Progressing   Problem: Skin Integrity: Goal: Risk for impaired skin integrity will decrease Outcome: Progressing   Problem: Education: Goal: Knowledge of General Education information will improve Description: Including pain rating scale, medication(s)/side effects and non-pharmacologic comfort measures Outcome: Not Progressing - pt's mentation does not allow for education at this time.  Pt appears encephalopathic.   "

## 2024-03-19 NOTE — Progress Notes (Addendum)
 Nutrition Follow-up  DOCUMENTATION CODES:   Obesity unspecified  INTERVENTION:  NPO until pt able to participate in yale swallow assessment with RN  Initiate tube feeding via Cortrak: Vital 1.5 at 50 ml/h (1200 ml per day) *start at 20ml and advance by 10ml q8h to goal rate Provides 1800 kcal, 81 gm protein, 917 ml free water  daily  Recommend addition of ProSource TF20 to better meet nutrition needs however this flags for aspartame and phenylalanine allergy ; will need to confirm with patient when more alert   Monitor magnesium , potassium, and phosphorus daily for at least 3 days, MD to replete as needed, as pt is at risk for refeeding syndrome.  Continue Thiamine  100 mg daily for 7 days  Add Juven BID to support wound healing when able to confirm aspartame/phenylalanine allergy   NUTRITION DIAGNOSIS:   Moderate Malnutrition related to acute illness as evidenced by mild fat depletion, moderate muscle depletion, edema. - diagnosis updated 1/12   GOAL:   Patient will meet greater than or equal to 90% of their needs - goal unmet, addressing via initiation of TF  MONITOR:   Diet advancement, TF tolerance, Skin, Weight trends, Labs, I & O's  REASON FOR ASSESSMENT:   Consult Assessment of nutrition requirement/status  ASSESSMENT:   59 y/o female with h/o HLD, stroke, fibromyalgia, NICM, HTN, IDDM, gastroparesis, cholecystectomy, substance abuse, gout, GERD, anxiety, PUD, C diff, NASH, duodenal carcinoid s/p resection (2022), chronic pain, PAF, CHF, ESRD on HD, gastric AVM, PVD s/p R BKA, TD and recently hospitalized 02/14/2024-02/25/2024 for sepsis secondary to left thigh abscess and underwent I&D with gen surg 02/16/2024 and who is anow admitted with AMS, syncope, septic shock and K pneumoniae bacteremia.  1/9 admitted with syncopal event +LOC, shock, +pressors 1/10- CRRT initiated d/t significant shock state and metabolic acidosis 1/12- Cortrak placed; TF initiated  Patient  remains on CRRT. Anuric.  Net UF x24 hours + x 12 hours C diff negative on admission.   Checked in with patient at bedside. She arouses to voice and shoulder rub, however does not open eyes and provide any meaningful information.   Cortrak placed today.   Patient discussed in rounds. CCM amenable to initiation of nutrition via Cortrak.  Will monitor tolerance of tube feeds and oral nutrition once safe to resume given h/o gastroparesis.   Pt had been followed by inpatient RD team during admissions in 2024. In 2024, she was eating fair during admission. During this admission, pt had received prosource oral which she appeared to tolerate well at that time therefore would likely tolerate prosource TF modular however will hold on ordering until able to confirm with patient.  In 2025 (last admission in December), pt had been requiring nutrition support via Cortrak d/t lethargy and inability to maintain adequate oral intake. Suspect that intake continued to remain inadequate post discharge given escalation of deficits within the last month. Suspect a degree of her malnutrition is r/t chronic co-morbidities however unable to confirm chronicity at this time.   Admit weight: 103.1 kg Current weight: 100.9 kg + edema  Medications: Vitamin C  500mg  BID, solu-cortef  BID, novolog  1-3 units q4h, rena-vit, thiamine  100mg  daily Drips: Abx Vaso @ 0.01 units/min  Labs:  Cr 2.10 Phosphorus 2.4 Albumin  1.9 GFR 27 CBG's 102-155 x24 hours  NUTRITION - FOCUSED PHYSICAL EXAM: Although history and nutrition focused physical exam is limited, pt is observed to have mild subcutaneous deficits subcutaneous adipose tissue and moderate-severe upper extremity muscle deficits which is suggestive of  mild malnutrition. It is likely there is a more significant degree of deficits being masked by the presence of edema.  Flowsheet Row Most Recent Value  Orbital Region Mild depletion  Upper Arm Region No  depletion  Thoracic and Lumbar Region No depletion  Buccal Region Mild depletion  Temple Region Moderate depletion  Clavicle Bone Region Moderate depletion  Clavicle and Acromion Bone Region Severe depletion  Scapular Bone Region Unable to assess  Dorsal Hand Unable to assess  [hand mits]  Patellar Region Unable to assess  Anterior Thigh Region Unable to assess  [severe deep pitting edema]  Posterior Calf Region Unable to assess  [R BKA]  Edema (RD Assessment) Severe  [BLE]  Hair Reviewed  Eyes Unable to assess  Mouth Unable to assess  Skin Other (Comment)  [dry, flakey shoulders, stretch marks upper shoulder/arms]  Nails Unable to assess    Diet Order:   Diet Order             Diet renal with fluid restriction Fluid restriction: 1500 mL Fluid; Room service appropriate? Yes; Fluid consistency: Thin  Diet effective now                   EDUCATION NEEDS:   Not appropriate for education at this time  Skin:  Skin Assessment: Reviewed RN Assessment (Stage 3 PI b/l medial buttocks, full thickness L groin s/p I&D 02/16/2024, Stage 2 Pressure Injury L posterior thigh pink)  Last BM:  1/11 type 6, type 7 large  Height:   Ht Readings from Last 1 Encounters:  02/16/24 5' 6 (1.676 m)    Weight:   Wt Readings from Last 1 Encounters:  03/19/24 100.9 kg    Ideal Body Weight:  59 kg  BMI:  Body mass index is 35.9 kg/m.  Estimated Nutritional Needs:   Kcal:  1700-1900  Protein:  100-115g  Fluid:  UOP +1L  Royce Maris, RDN, LDN Clinical Nutrition See AMiON for contact information.

## 2024-03-19 NOTE — Progress Notes (Signed)
 PHARMACY ANTIBIOTIC CONSULT NOTE   Deborah [name] a 59 y.o. female admitted on 03/17/2023 from SNF after syncopal episode with LOC, to ICU with presumed septic shock requiring vasopressors. ESRD on iHD TTS PTA, currently on CRRT (Started 1/10) with some residual UOP. Pt with ESBL Kleb PNA growing in blood and likely GPR without speciation. Pharmacy has been consulted for vancomycin , merrem  dosing. Recent admit 12/9-20 for sepsis secondary to L thigh abscess s/p I&D 12/11.   Vancomycin  random level < 20 mcg/mL this AM.  Patient continues on CRRT.  Plan: Continue Merrem  1g IV Q8H Schedule vanc 1gm IV Q24H Oral vancomycin  ppx 125mg  PO/PTdaily to prevent C diff recurrence (continue until broad spec abx stopped) Monitor CRRT tolerance/interruption, micro data, vanc levels as indicated   Allergies:  Allergies[1]  Filed Weights   03/17/24 0100 03/18/24 0500 03/19/24 0354  Weight: 103.1 kg (227 lb 4.7 oz) 103.1 kg (227 lb 4.7 oz) 100.9 kg (222 lb 7.1 oz)       Latest Ref Rng & Units 03/19/2024    3:47 AM 03/18/2024    9:30 PM 03/18/2024    4:39 PM  CBC  WBC 4.0 - 10.5 K/uL 14.1     Hemoglobin 12.0 - 15.0 g/dL 7.6  7.7    Hematocrit 36.0 - 46.0 % 23.5  24.1    Platelets 150 - 400 K/uL 82   94     Antibiotics Given (last 72 hours)     Date/Time Action Medication Dose Rate   03/16/24 2004 New Bag/Given   ceFEPIme  (MAXIPIME ) 2 g in sodium chloride  0.9 % 100 mL IVPB 2 g 200 mL/hr   03/16/24 2047 New Bag/Given   vancomycin  (VANCOREADY) IVPB 2000 mg/400 mL 2,000 mg 200 mL/hr   03/17/24 0643 New Bag/Given   metroNIDAZOLE  (FLAGYL ) IVPB 500 mg 500 mg 100 mL/hr   03/17/24 0954 New Bag/Given   meropenem  (MERREM ) 500 mg in sodium chloride  0.9 % 100 mL IVPB 500 mg 200 mL/hr   03/17/24 1213 Given   vancomycin  (VANCOCIN ) 50 mg/mL oral solution SOLN 125 mg 125 mg    03/17/24 1459 New Bag/Given   meropenem  (MERREM ) 1 g in sodium chloride  0.9 % 100 mL IVPB 1 g 200 mL/hr   03/17/24 1501 Given    vancomycin  (VANCOCIN ) capsule 125 mg 125 mg    03/17/24 1604 New Bag/Given   vancomycin  (VANCOCIN ) IVPB 1000 mg/200 mL premix 1,000 mg 200 mL/hr   03/17/24 2141 New Bag/Given   meropenem  (MERREM ) 1 g in sodium chloride  0.9 % 100 mL IVPB 1 g 200 mL/hr   03/18/24 0544 New Bag/Given   meropenem  (MERREM ) 1 g in sodium chloride  0.9 % 100 mL IVPB 1 g 200 mL/hr   03/18/24 1126 Given   vancomycin  (VANCOCIN ) 50 mg/mL oral solution SOLN 125 mg 125 mg    03/18/24 1302 New Bag/Given   meropenem  (MERREM ) 1 g in sodium chloride  0.9 % 100 mL IVPB 1 g 200 mL/hr   03/18/24 2121 New Bag/Given   meropenem  (MERREM ) 1 g in sodium chloride  0.9 % 100 mL IVPB 1 g 200 mL/hr   03/19/24 0515 New Bag/Given   meropenem  (MERREM ) 1 g in sodium chloride  0.9 % 100 mL IVPB 1 g 200 mL/hr      CFP/MTZ 1/9 x1 Vanc 1/9 >> 1/10, restarted 1/11 >> Merrem  1/10 >  PO Vanc ppx 1/10 >   1/11 VR at 1938 = 21 1/12 VR at 0347 = 19  1/9 BCx: ESBL  Kleb pneumo, unspecified GPR  1/10 C.diff - negative 1/10 MRSA PCR - negative  Margot Oriordan D. Lendell, PharmD, BCPS, BCCCP 03/19/2024, 9:05 AM     [1]  Allergies Allergen Reactions   Deborah [name] ] Other (See Comments)    Hx of stomach ulcer   Bentyl  [Dicyclomine ] Other (See Comments)    Chest pain   Gabapentin  Hives and [name]   Isovue  [Iopamidol ] Anaphylaxis, [name] and Other (See Comments)    11/28/17 Patient had seizure like activity and then 1 min code after 100 cc of isovue  300. Possible contrast allergy  vs vasovagal episode  Cardiac Arrest   Nsaids Anaphylaxis and Other (See Comments)    Hx of stomach ulcers   Penicillins Itching, Palpitations and Other (See Comments)    Flushing (Red Skin) Laryngeal Edema   Reglan  [Metoclopramide ] Other (See Comments)    Tardive dyskinesia    Valium  [Diazepam ] [name]   Zestril [Lisinopril] Anaphylaxis and Swelling    Tongue and mouth swelling Laryngeal Edema   Tolectin [Tolmetin] Nausea And  Vomiting, Nausea Only and Other (See Comments)    Irritates stomach ulcer   Heparin      HIT   Aspartame And Phenylalanine Hives   Flexeril  [Cyclobenzaprine ] Palpitations   Hibiclens  [Chlorhexidine  Gluconate] Dermatitis   Oxycontin  [Oxycodone ] Palpitations   Rifamycins Palpitations   Tylenol  [Acetaminophen ] Nausea And Vomiting, Nausea Only and Other (See Comments)    Irritates stomach ulcer Abdominal pain   Ultram  [Tramadol ] Nausea And Vomiting and Palpitations

## 2024-03-19 NOTE — Procedures (Signed)
 Cortrak  Person Inserting Tube:  Casia Corti, Olivia SAUNDERS, RD Tube Type:  Cortrak - 43 inches Tube Size:  10 Tube Location:  Left nare Secured by: Bridle Initial Placement:  Gastric Technique Used to Measure Tube Placement:  Marking at nare/corner of mouth Cortrak Secured At:  64 cm Initial Placement Verification:  Cortrak device (Registered Dieticians Only)  Cortrak Tube Team Note:  Consult received to place a Cortrak feeding tube.   No x-ray is required. RN may begin using tube.   If the tube becomes dislodged please keep the tube and contact the Cortrak team at [email or web address] for replacement.  If after hours and replacement cannot be delayed, place a NG tube and confirm placement with an abdominal x-ray.    Olivia Kenning, RD Registered Dietitian  See Amion for more information

## 2024-03-19 NOTE — Progress Notes (Signed)
 " [facility] Progress Note    Assessment/ Plan:    ESRD - on HD TTS - CRRT started 1/10 due to sig shock state and met acidosis  - cont CRRT, running net even, will increase UFG to net neg 50cc/hr     shock/ hypotension - remains on 2 vasopressors - getting po midodrine  10 tid - GNR bacteremia likely the cause  Sepsis -secondary to klebsiella pn bacteremia -on meropenem  and vanc -also on po vanc -per primary service     Volume - came off 4kg over last OP HD - diffuse 2+ LE edema - CXR clear, doubt intravasc vol overload - keeping even w/ CRRT   Anemia of esrd - Hb 7-10 here - follow, transfuse prn  -avoid IV Fe for now, starting aranesp  1/13   Gram neg bacteremia - K. Pneumoniae sepsis - keep RIJ TDC for now    Diarrhea - getting empiric po vanc - having diarrhea and concern for Cdif, neg testing 1/10  OP HD: [place] T[facility] 4h  B400   96.1kg  TDC   Hep none Last OP HD 1/08, post wt 100kg  Discussed with ICU RN.    Subjective:   Patient seen and examined on CRRT. No complaints, tolerating CRRT Pressors: vaso and levo (which was just restarted) Net positive ~0.2L   Objective:   BP (!) 96/53   Pulse 89   Temp 97.7 F (36.5 C) (Axillary)   Resp 14   Wt 100.9 kg   LMP 10/10/2012   SpO2 97%   BMI 35.90 kg/m   Intake/Output Summary (Last 24 hours) at 03/19/2024 0945 Last data filed at 03/19/2024 0900 Gross per 24 hour  Intake 968.5 ml  Output 673 ml  Net 295.5 ml   Weight change: -2.2 kg  Physical Exam: Gen: ill appearing CVS: RRR Resp: CTA BL Abd: obese, soft Ext: right BKA, trace dependent edema Neuro: drowsy but arousable Dialysis access: RIJ TDC in use  Imaging: ECHOCARDIOGRAM LIMITED Result Date: 03/18/2024    ECHOCARDIOGRAM LIMITED REPORT   Patient Name:   Deborah [name] Date of Exam: 03/18/2024 Medical Rec #:  [id number]            Height:       66.0 in Accession #:    [id number]           Weight:       227.3 lb Date  of Birth:  16-Apr-1965            BSA:          2.112 m Patient Age:    58 years             BP:           112/46 mmHg Patient Gender: F                    HR:           93 bpm. Exam Location:  Inpatient Procedure: Limited Echo, Cardiac Doppler and Limited Color Doppler (Both            Spectral and Color Flow Doppler were utilized during procedure). Indications:    Shock  History:        Patient has prior history of Echocardiogram examinations, most                 recent 03/17/2024. CHF, Abnormal ECG, Stroke, Arrythmias:Atrial  Fibrillation; Risk Factors:Hypertension and Diabetes.  Sonographer:    Sherlean Dubin Referring Phys: [id number] TORIBIO JAYSON SHARPS  Sonographer Comments: Image acquisition challenging due to patient body habitus and Image acquisition challenging due to respiratory motion. IMPRESSIONS  1. Left ventricular ejection fraction, by estimation, is 60 to 65%. The left ventricle has normal function. There is mild concentric left ventricular hypertrophy. Left ventricular diastolic parameters were normal.  2. Basal and apical function preserved but unable to assess free wall function due to poor visualization. Right ventricular systolic function was not well visualized. The right ventricular size is not well visualized.  3. There is a small pericardial effusion located posteriorly (#21) . a small pericardial effusion is present. The pericardial effusion is posterior to the left ventricle.  4. The mitral valve is normal in structure. No evidence of mitral valve regurgitation. No evidence of mitral stenosis.  5. Calcified nodule in left coronary cusp. The aortic valve is abnormal. Aortic valve regurgitation is not visualized. No aortic stenosis is present.  6. The inferior vena cava is dilated in size with <50% respiratory variability, suggesting right atrial pressure of 15 mmHg. Comparison(s): No significant change from prior study. Prior images reviewed side by side. FINDINGS  Left Ventricle:  Left ventricular ejection fraction, by estimation, is 60 to 65%. The left ventricle has normal function. The left ventricular internal cavity size was normal in size. There is mild concentric left ventricular hypertrophy. Left ventricular diastolic parameters were normal. Right Ventricle: Basal and apical function preserved but unable to assess free wall function due to poor visualization. The right ventricular size is not well visualized. Right vetricular wall thickness was not well visualized. Right ventricular systolic  function was not well visualized. Left Atrium: Left atrial size was normal in size. Right Atrium: Right atrial size was normal in size. Pericardium: There is a small pericardial effusion located posteriorly (#21). A small pericardial effusion is present. The pericardial effusion is posterior to the left ventricle. Mitral Valve: The mitral valve is normal in structure. No evidence of mitral valve stenosis. Tricuspid Valve: The tricuspid valve is not well visualized. Tricuspid valve regurgitation is not demonstrated. No evidence of tricuspid stenosis. Aortic Valve: Calcified nodule in left coronary cusp. The aortic valve is abnormal. Aortic valve regurgitation is not visualized. No aortic stenosis is present. Venous: The inferior vena cava is dilated in size with less than 50% respiratory variability, suggesting right atrial pressure of 15 mmHg. IAS/Shunts: The interatrial septum was not well visualized. LEFT VENTRICLE PLAX 2D LVIDd:         5.20 cm   Diastology LVIDs:         3.50 cm   LV e' medial:    6.64 cm/s LV PW:         1.10 cm   LV E/e' medial:  13.3 LV IVS:        1.00 cm   LV e' lateral:   12.70 cm/s LVOT diam:     2.30 cm   LV E/e' lateral: 6.9 LV SV:         86 LV SV Index:   41 LVOT Area:     4.15 cm  RIGHT VENTRICLE             IVC RV S prime:     11.90 cm/s  IVC diam: 2.20 cm LEFT ATRIUM         Index LA diam:    4.10 cm 1.94 cm/m  AORTIC VALVE LVOT Vmax:  99.60 cm/s LVOT Vmean:   66.000 cm/s LVOT VTI:    0.208 m  AORTA Ao Root diam: 3.30 cm MITRAL VALVE MV Area (PHT): 5.27 cm    SHUNTS MV Decel Time: 144 msec    Systemic VTI:  0.21 m MV E velocity: 88.00 cm/s  Systemic Diam: 2.30 cm MV A velocity: 99.00 cm/s MV E/A ratio:  0.89 Franck Azobou Tonleu Electronically signed by Joelle Cedars Tonleu Signature Date/Time: 03/18/2024/10:27:49 AM    Final     Labs: BMET Recent Labs  Lab 03/16/24 1711 03/16/24 1754 03/17/24 0258 03/17/24 9171 03/17/24 1629 03/18/24 0447 03/18/24 0649 03/18/24 1535 03/19/24 0347  NA 135   < > 135 135 135 133* 133* 134* 136  K 4.2   < > 4.5 4.5 4.7 4.3 4.0 3.7 3.7  CL 97*  --  97* 96* 96* 92*  --  96* 99  CO2 14*  --  12* 14* 19* 26  --  26 23  GLUCOSE 75  --  141* 131* 168* 180*  --  131* 138*  BUN 23*  --  22* 22* 19 15  --  12 11  CREATININE 7.04*  --  6.60* 6.50* 5.59* 3.94*  --  2.90* 2.10*  CALCIUM  7.8*  --  8.3* 8.1* 7.7* 7.6*  --  7.2* 7.4*  PHOS 6.3*  --   --  6.1* 5.5* 4.1  --  3.0 2.4*   < > = values in this interval not displayed.   CBC Recent Labs  Lab 03/16/24 1711 03/16/24 1754 03/17/24 2309 03/18/24 0447 03/18/24 0649 03/18/24 1535 03/18/24 1639 03/18/24 2130 03/19/24 0347  WBC 11.6*   < > 12.4* 12.6*  --  13.1*  --   --  14.1*  NEUTROABS 10.1*  --   --   --   --   --   --   --   --   HGB 7.6*   < > 8.0* 7.9* 8.8* 6.6*  --  7.7* 7.6*  HCT 23.3*   < > 24.4* 23.6* 26.0* 20.5*  --  24.1* 23.5*  MCV 96.3   < > 95.7 94.0  --  99.0  --   --  95.1  PLT 145*   < > 135* 130*  --  93* 94*  --  82*   < > = values in this interval not displayed.    Medications:     acetaminophen   1,000 mg Oral Q8H   ascorbic acid   500 mg Oral BID   atorvastatin   10 mg Oral Daily   busPIRone   5 mg Oral TID   clotrimazole   1 Applicatorful Vaginal QHS   collagenase    Topical Daily   feeding supplement (NEPRO CARB STEADY)  237 mL Oral TID BM   hydrocortisone  sod succinate (SOLU-CORTEF ) inj  100 mg Intravenous Q12H   insulin  aspart   1-3 Units Subcutaneous Q4H   midodrine   10 mg Oral Q8H   multivitamin  1 tablet Oral QHS   sodium chloride  flush  10-40 mL Intracatheter Q12H   sodium chloride  flush  3 mL Intravenous Q12H   thiamine   100 mg Oral Daily   vancomycin   125 mg Per Tube Daily   Or   vancomycin   125 mg Oral Daily      Ephriam Stank, MD [facility] 03/19/2024, 9:45 AM   "

## 2024-03-19 NOTE — TOC Initial Note (Signed)
 Transition of Care [facility]) - Initial/Assessment Note    Patient Details  Name: Deborah [name] MRN: [id number] Date of Birth: 1965/05/08  Transition of Care [facility]) CM/SW Contact:    Lendia Dais, LCSWA Phone Number: 03/19/2024, 4:15 PM  Clinical Narrative:  Pt is from [facility] and can return upon discharge per [place] [facility] [place]. CSW attempted to reach out to pt's mother for initial assessment. No answer given.   CSW will continue to follow.       Expected Discharge Plan: Long Term Nursing Home Barriers to Discharge: Continued Medical Work up   Patient Goals and CMS Choice Patient states their goals for this hospitalization and ongoing recovery are:: pt disoriented          Expected Discharge Plan and Services       Living arrangements for the past 2 months: Skilled Nursing Facility                                      Prior Living Arrangements/Services Living arrangements for the past 2 months: Skilled Nursing Facility Lives with:: Facility Resident Patient language and need for interpreter reviewed:: Yes Do you feel safe going back to the place where you live?: Yes      Need for Family Participation in Patient Care: Yes (Comment) Care giver support system in place?: Yes (comment)   Criminal Activity/Legal Involvement Pertinent to Current Situation/Hospitalization: No - Comment as needed  Activities of Daily Living   ADL Screening (condition at time of admission) Independently performs ADLs?: No Does the patient have a NEW difficulty with bathing/dressing/toileting/self-feeding that is expected to last >3 days?: No Does the patient have a NEW difficulty with getting in/out of bed, walking, or climbing stairs that is expected to last >3 days?: No Does the patient have a NEW difficulty with communication that is expected to last >3 days?: No Is the patient deaf or have difficulty hearing?: No Does the patient have difficulty seeing, even when  wearing glasses/contacts?: No Does the patient have difficulty concentrating, remembering, or making decisions?: Yes  Permission Sought/Granted Permission sought to share information with : Family Supports Permission granted to share information with : No  Share Information with NAME: Charise Leinbach  Permission granted to share info w AGENCY: [facility]  Permission granted to share info w Relationship: Mother  Permission granted to share info w Contact Information: [phone]  Emotional Assessment Appearance:: Appears stated age Attitude/Demeanor/Rapport: Unable to Assess (disoriented) Affect (typically observed): Unable to Assess Orientation: : Oriented to Self, Oriented to Place Alcohol / Substance Use: Not Applicable Psych Involvement: No (comment)  Admission diagnosis:  Acidosis [E87.20] Syncope [R55] Hypotension, unspecified hypotension type [I95.9] Patient Active Problem List   Diagnosis Date Noted   Bacteremia due to Klebsiella pneumoniae 03/19/2024   Encephalopathy acute 03/19/2024   Septic shock (HCC) 03/19/2024   Syncope 03/16/2024   Altered mental status 02/14/2024   Obesity, class 2 06/09/2023   Acute on chronic diastolic CHF (congestive heart failure) (HCC) 05/29/2023   Gastroparesis 05/29/2023   Chronic wound of left heel 05/29/2023   Amputation of right lower extremity below knee (HCC) 04/30/2023   Non-pressure chronic ulcer of other part of left foot limited to breakdown of skin (HCC) 04/30/2023   Dysphagia 04/28/2023   Volume overload 04/25/2023   Chronic diastolic CHF (congestive heart failure) (HCC) 04/25/2023   Carcinoid tumor of abdomen (HCC) 04/25/2023  PVD (peripheral vascular disease) 04/25/2023   History of GI bleed 04/25/2023   History of arteriovenous malformation (AVM) 04/25/2023   Generalized anxiety disorder 04/25/2023   History of reactive airway disease 04/25/2023   History of spinal stenosis 04/25/2023   Chronic diabetic ulcer of  left foot determined by examination (HCC) 04/25/2023   Diarrhea 03/01/2023   Intractable diarrhea 02/28/2023   Sinus tachycardia 02/17/2023   Cellulitis of heel, left 02/17/2023   Hx of BKA, right (HCC) 01/31/2023   Tardive dyskinesia 01/06/2023   Peutz-Jeghers polyps of small bowel (HCC) 12/24/2022   Gastric AVM 12/23/2022   Anemia of chronic disease 12/14/2022   Pressure injury of skin 11/30/2022   Non-pressure chronic ulcer of right heel and midfoot limited to breakdown of skin (HCC) 11/24/2022   Severe sepsis (HCC) 11/23/2022   ESRD on dialysis (HCC) 09/09/2022   Peripheral neuropathy 09/09/2022   (HFpEF) heart failure with preserved ejection fraction (HCC) 09/09/2022   Hypotension 09/09/2022   CHF (congestive heart failure) (HCC) 07/13/2022   Hyponatremia 05/29/2022   Leukocytosis 05/11/2022   Edema of left upper extremity 03/21/2022   Acute pyelonephritis    Paroxysmal atrial fibrillation (HCC)    Chronic pain syndrome/chronic abdominal pain 09/04/2019   Malignant carcinoid tumor of duodenum (HCC)    NASH (nonalcoholic steatohepatitis) 06/05/2019   Chronic diarrhea with history of C.Diff    Restrictive lung disease secondary to obesity    History of gastric ulcer    Fibroid uterus 02/23/2019   Abdominal pain 07/17/2018   Anxiety 11/29/2017   Spinal stenosis, lumbar region with neurogenic claudication 08/03/2017   GERD (gastroesophageal reflux disease) 03/19/2017   Chronic depression/anxiety 03/19/2017   Morbid obesity (HCC)    Normocytic anemia 08/16/2016   Chronic gout 06/05/2016   Chronic hypokalemia 09/26/2015   Hypomagnesemia and hypokalemia 09/26/2015   Insulin  dependent type 2 diabetes mellitus (HCC) 05/25/2015   Essential hypertension 09/28/2013   PCP:  Freddrick, No Pharmacy:   [facility] - [place], [place] - [address] [place] [address] Phone: [phone] Fax: [phone]  [facility] [address] [place] [address] Phone: [phone] Fax: [phone]  [id number] [address], [place] - [address] AT [facility] [address] [place] [address] Phone: [phone] Fax: [phone]  [id number] - [address] ([place]), [place] - [address] [place] [address] Phone: [phone] Fax: [phone]  [facility] - [place], [place] - [address] [place] [address] Phone: [phone] Fax: [phone]     Social Drivers of Health (SDOH) Social History: SDOH Screenings   Food Insecurity: No Food Insecurity (03/18/2024)  Housing: Low Risk (03/18/2024)  Transportation Needs: No Transportation Needs (03/18/2024)  Utilities: Not At Risk (03/18/2024)  Recent Concern: Utilities - At Risk (02/18/2024)  Depression (PHQ2-9): Medium Risk (03/16/2023)  Financial Resource Strain: Low Risk (03/23/2021)   [facility] from [facility])  Social Connections: Low Risk (03/23/2021)   [facility])  Stress: Low Risk (03/23/2021)   Received from [facility])  Tobacco Use: Low Risk (03/18/2024)   SDOH Interventions:     Readmission Risk Interventions    02/20/2024   11:05 AM 03/07/2023    4:19 PM 11/25/2022    5:16 PM  Readmission Risk Prevention Plan  Transportation Screening Complete Complete Complete  Medication Review Oceanographer) Complete Complete Complete  PCP or Specialist appointment within 3-5 days of discharge Complete Complete Complete  HRI or Home Care Consult Complete Complete Complete  SW Recovery Care/Counseling Consult Complete Complete Complete  Palliative Care Screening Not Applicable Complete Not Applicable  Skilled Nursing Facility Complete Not Applicable Not Applicable

## 2024-03-19 NOTE — Plan of Care (Signed)
   Problem: Nutrition: Goal: Adequate nutrition will be maintained Outcome: Progressing   Problem: Safety: Goal: Ability to remain free from injury will improve Outcome: Progressing   Problem: Skin Integrity: Goal: Risk for impaired skin integrity will decrease Outcome: Progressing

## 2024-03-19 NOTE — Progress Notes (Addendum)
 "  NAME:  Deborah [name], MRN:  [id number], DOB:  [birth year], LOS: 3 ADMISSION DATE:  03/16/2024, CONSULTATION DATE:  03/16/24 REFERRING MD:  Guillermina RAMAN., CHIEF COMPLAINT:  Syncopal event; +LOC; shock   History of Present Illness:  Deborah [name] is a 59 yo female with past medical history significant for PAF, HLD, right BKA, ESRD on HD (TTS) who presented from SNF to [facility] after syncopal episode with +LOC. Patient hemodynamically unstable in ED despite fluid resuscitation with 3L crystalloid and was subsequently started on vasopressors. Labs notable for metabolic acidosis, sCr 7.04, BUN 23, iCa 1.00, troponin 105, lactic acic 9.0, given 1 amp HCO3. On exam patient with diffuse abdominal pain, exam somewhat limited as she has chronic pain managed daily with Dilaudid . Patient started empirically on Cefepime , Vanc, Flagyl ; CT A/P pending, and syncope workup in progress. PCCM consulted for ICU admission.  Of note, patient recently hospitalized 02/14/2024-02/25/2024 for sepsis secondary to left thigh abscess and underwent I&D with gen surg 02/16/2024.  Pertinent Medical History:   Past Medical History:  Diagnosis Date   Acute back pain with sciatica, left    Acute back pain with sciatica, right    Acute encephalopathy 05/29/2022   Acute osteomyelitis of right calcaneus (HCC) 12/21/2022   Anemia, unspecified    Atrial fibrillation with RVR (HCC)-resolved 09/09/2022   Atypical chest pain 09/10/2021   Cancer (HCC)    Carcinoid tumor of duodenum (HCC)    Chest pain with normal coronary angiography 2019   Chronic a-fib (HCC) 09/09/2022   Chronic pain    Chronic systolic CHF (congestive heart failure) (HCC)    Dehiscence of amputation stump of right lower extremity (HCC) 01/29/2023   Diabetes mellitus    DKA (diabetic ketoacidosis) (HCC)    Drug-seeking behavior    21 hospitalizations and 14 CT a/p in 2 years for N/V and abdominal pain, demanding only IV dilaudid    Elevated troponin     chronic   Esophageal reflux    Fibromyalgia    Gastric ulcer    Gastroparesis    Gout    HCAP (healthcare-associated pneumonia) 06/19/2022   Hyperlipidemia    Hyperosmolar hyperglycemic state (HHS) (HCC) 05/11/2022   Hyperosmolar non-ketotic state due to type 2 diabetes mellitus (HCC) 05/11/2022   Hypertension    Hypomagnesemia    Lumbosacral stenosis    LVH (left ventricular hypertrophy)    Morbid obesity (HCC)    Nausea & vomiting 09/09/2022   NICM (nonischemic cardiomyopathy) (HCC)    PAF (paroxysmal atrial fibrillation) (HCC)    Sepsis (HCC) 11/23/2022   Stroke (HCC) 02/2011   Symptomatic anemia 12/14/2022   Thrombocytosis    Vitamin B12 deficiency anemia    Significant Hospital Events: Including procedures, antibiotic start and stop dates in addition to other pertinent events   1/9 ED s/p witnessed syncopal event w/ +LOC in shock, started on Levo/Vaso; Aline/CVC placed 1/10 CRRT, still on vaso, NE. Esclated to meropenem  for ESBL Klebsiella 1/12: off levophed , on vaso  Interim History / Subjective:   Awake this morning, states her name and where she is when asked, states she lives alone, no further complaints today.   Objective    Blood pressure (!) 96/53, pulse 89, temperature 97.7 F (36.5 C), temperature source Axillary, resp. rate 14, weight 100.9 kg, last menstrual period 10/10/2012, SpO2 97%.        Intake/Output Summary (Last 24 hours) at 03/19/2024 0926 Last data filed at 03/19/2024 0900 Gross per 24 hour  Intake 968.5  ml  Output 673 ml  Net 295.5 ml   Filed Weights   03/17/24 0100 03/18/24 0500 03/19/24 0354  Weight: 103.1 kg 103.1 kg 100.9 kg    Examination: General: chronically ill appearing, lying in bed, awake HEENT: [place]/AT, eyes anicteric Pulm: breathing comfortably on RA, no accessory muscle use, distant lung sounds CV: S1S2, RRR GI: obese, soft, NT Extremities:  R BKA, L chronic venous stasis changes, heel wound covered. R fem CVC. Neuro:  awake, opens eyes and answers some questions when asked   Resolved Problem List:  Hypoglycemia due to sepsis, resolved   Assessment and Plan:   Septic shock due to Klebsiella pneumoniae bacteremia Acute metabolic encephalopathy due to sepsis Lactic acidosis due to sepsis Chronic Hypotension -Meropenem  1/10 (s/p cefepime , flagyl  x1 dose 1/9) -Vancomycin  (1/9)-discontinue today 1/12 due to culture results and sensitivities  -PO vancomycin  due to risk of cdiff, negative  -Off Levophed  1/12, weaning of vaso -Continue midodrine  10mg  TID   History of Cdiff - cdiff negative - on prophylactic dose PO vanc  Syncope; suspect due to sepsis -echo: EF 60-605%, mild LVH, small pericardial effusion posteriorly   Troponin elevation; likely demand d/t shock state PAF-not on AC -holding AC -treat sepsis  HLD - PTA statin  ESRD on HD (TTS) Chronic hypotension-takes midodrine  before dialysis; however reports taking daily hyperphosphatemia -con't CRRT-- has permacath from PTA -con't midodrine  10mg  q8h -appreciate nephrology's management  Acute abdominal pain, resolved  Hx duodenal carcinoid tumor s/p resection (2022) Hx gastric ulcer & gastritis (2024) Chronic gastroparesis -con't H2-blocker; trying to avoid PPI due to C diff risk on broad spectrum antibiotics -PRN pain meds  Anemia of chronic disease -drop in Hb may be dilutional. No evidence of DIC or bleeding -daily CBC; hgb stable -holding [facility] -nephrology started aranesp  1/13  DM2; hypoglycemia from sepsis corrected with steroids -SSI PRN -goal BG 140-180  PVD s/p R BKA- stump is well healed -PT & OT once able to participate; not sure what baseline mobility is with baseline SNF residence  Sacral pressure injury -turns -unfortunately needs pressors -hard to keep that skin clear with frequent diarrhea  Possible vaginal yeast infection -Clotrimazole  cream daily x7 days  Anxiety/Depression -Resume home buspar ,  risperidone , trazodone  when appropriate> waiting for mental status to normalize some  Chronic pain Hx fibromyalgia S/p I&D for left thigh abscess 02/16/24 -MMPR; schedule tylenol , robaxin ; oxy PRN  Drug-seeking behavior Opioid-use disorder Hx substance abuse-- as an OP following with [facility]  Wounds-- left thigh, R hip, lower abdomen, buttocks, L heel -turns, trying to get off pressors -WOC as needed  Full code; mother is surrogate management consultant.    Critical care time:  30 minutes    Brynnlee Cumpian, PA-C [facility] For pager details, please see AMION or use Epic chat  After 1900, please call [facility] for cross coverage needs 03/19/2024, 9:39 AM     "

## 2024-03-20 ENCOUNTER — Telehealth (HOSPITAL_COMMUNITY): Payer: Self-pay

## 2024-03-20 ENCOUNTER — Other Ambulatory Visit (HOSPITAL_COMMUNITY): Payer: Self-pay

## 2024-03-20 DIAGNOSIS — B961 Klebsiella pneumoniae [K. pneumoniae] as the cause of diseases classified elsewhere: Secondary | ICD-10-CM | POA: Diagnosis not present

## 2024-03-20 DIAGNOSIS — F418 Other specified anxiety disorders: Secondary | ICD-10-CM

## 2024-03-20 DIAGNOSIS — A419 Sepsis, unspecified organism: Secondary | ICD-10-CM | POA: Diagnosis not present

## 2024-03-20 DIAGNOSIS — N179 Acute kidney failure, unspecified: Secondary | ICD-10-CM

## 2024-03-20 DIAGNOSIS — R6521 Severe sepsis with septic shock: Secondary | ICD-10-CM | POA: Diagnosis not present

## 2024-03-20 DIAGNOSIS — I120 Hypertensive chronic kidney disease with stage 5 chronic kidney disease or end stage renal disease: Secondary | ICD-10-CM | POA: Diagnosis not present

## 2024-03-20 LAB — CBC
HCT: 22.8 % — ABNORMAL LOW (ref 36.0–46.0)
Hemoglobin: 7.6 g/dL — ABNORMAL LOW (ref 12.0–15.0)
MCH: 31.3 pg (ref 26.0–34.0)
MCHC: 33.3 g/dL (ref 30.0–36.0)
MCV: 93.8 fL (ref 80.0–100.0)
Platelets: 66 K/uL — ABNORMAL LOW (ref 150–400)
RBC: 2.43 MIL/uL — ABNORMAL LOW (ref 3.87–5.11)
RDW: 20.8 % — ABNORMAL HIGH (ref 11.5–15.5)
WBC: 9.4 K/uL (ref 4.0–10.5)
nRBC: 3.2 % — ABNORMAL HIGH (ref 0.0–0.2)

## 2024-03-20 LAB — RENAL FUNCTION PANEL
Albumin: 2 g/dL — ABNORMAL LOW (ref 3.5–5.0)
Albumin: 2.2 g/dL — ABNORMAL LOW (ref 3.5–5.0)
Anion gap: 7 (ref 5–15)
Anion gap: 9 (ref 5–15)
BUN: 10 mg/dL (ref 6–20)
BUN: 13 mg/dL (ref 6–20)
CO2: 27 mmol/L (ref 22–32)
CO2: 24 mmol/L (ref 22–32)
Calcium: 7.6 mg/dL — ABNORMAL LOW (ref 8.9–10.3)
Calcium: 7.4 mg/dL — ABNORMAL LOW (ref 8.9–10.3)
Chloride: 103 mmol/L (ref 98–111)
Chloride: 104 mmol/L (ref 98–111)
Creatinine, Ser: 1.32 mg/dL — ABNORMAL HIGH (ref 0.44–1.00)
Creatinine, Ser: 1.45 mg/dL — ABNORMAL HIGH (ref 0.44–1.00)
GFR, Estimated: 46 mL/min — ABNORMAL LOW
GFR, Estimated: 41 mL/min — ABNORMAL LOW
Glucose, Bld: 182 mg/dL — ABNORMAL HIGH (ref 70–99)
Glucose, Bld: 269 mg/dL — ABNORMAL HIGH (ref 70–99)
Phosphorus: 1.8 mg/dL — ABNORMAL LOW (ref 2.5–4.6)
Phosphorus: 2.4 mg/dL — ABNORMAL LOW (ref 2.5–4.6)
Potassium: 3.7 mmol/L (ref 3.5–5.1)
Potassium: 3.6 mmol/L (ref 3.5–5.1)
Sodium: 136 mmol/L (ref 135–145)
Sodium: 137 mmol/L (ref 135–145)

## 2024-03-20 LAB — GLUCOSE, CAPILLARY
Glucose-Capillary: 166 mg/dL — ABNORMAL HIGH (ref 70–99)
Glucose-Capillary: 179 mg/dL — ABNORMAL HIGH (ref 70–99)
Glucose-Capillary: 221 mg/dL — ABNORMAL HIGH (ref 70–99)
Glucose-Capillary: 234 mg/dL — ABNORMAL HIGH (ref 70–99)
Glucose-Capillary: 240 mg/dL — ABNORMAL HIGH (ref 70–99)
Glucose-Capillary: 269 mg/dL — ABNORMAL HIGH (ref 70–99)

## 2024-03-20 LAB — HEPARIN INDUCED PLATELET AB (HIT ANTIBODY): Heparin Induced Plt Ab: 0.17 {OD_unit} (ref 0.000–0.400)

## 2024-03-20 LAB — MAGNESIUM: Magnesium: 2.3 mg/dL (ref 1.7–2.4)

## 2024-03-20 MED ORDER — SODIUM PHOSPHATES 45 MMOLE/15ML IV SOLN
20.0000 mmol | Freq: Once | INTRAVENOUS | Status: AC
  Administered 2024-03-20: 20 mmol via INTRAVENOUS
  Filled 2024-03-20: qty 6.67

## 2024-03-20 MED ORDER — HYDROMORPHONE HCL 2 MG PO TABS
2.0000 mg | ORAL_TABLET | Freq: Four times a day (QID) | ORAL | Status: DC | PRN
  Administered 2024-03-20 – 2024-03-22 (×4): 2 mg via ORAL
  Filled 2024-03-20 (×4): qty 1

## 2024-03-20 MED ORDER — SODIUM CHLORIDE 0.9 % IV SOLN
1.0000 g | INTRAVENOUS | Status: DC
  Administered 2024-03-20 – 2024-03-29 (×10): 1 g via INTRAVENOUS
  Filled 2024-03-20 (×10): qty 20

## 2024-03-20 MED ORDER — SODIUM CHLORIDE 0.9 % IV SOLN: 1.0000 g | INTRAVENOUS | Status: DC

## 2024-03-20 MED ORDER — POTASSIUM PHOSPHATES 15 MMOLE/5ML IV SOLN
15.0000 mmol | Freq: Once | INTRAVENOUS | Status: DC
  Filled 2024-03-20: qty 5

## 2024-03-20 MED ORDER — INSULIN ASPART 100 UNIT/ML IJ SOLN
0.0000 [IU] | INTRAMUSCULAR | Status: DC
  Administered 2024-03-20: 8 [IU] via SUBCUTANEOUS
  Administered 2024-03-21 (×5): 3 [IU] via SUBCUTANEOUS
  Administered 2024-03-22 – 2024-03-23 (×3): 2 [IU] via SUBCUTANEOUS
  Administered 2024-03-23: 3 [IU] via SUBCUTANEOUS
  Administered 2024-03-23: 5 [IU] via SUBCUTANEOUS
  Administered 2024-03-23: 3 [IU] via SUBCUTANEOUS
  Administered 2024-03-24: 2 [IU] via SUBCUTANEOUS
  Administered 2024-03-24: 3 [IU] via SUBCUTANEOUS
  Administered 2024-03-24: 5 [IU] via SUBCUTANEOUS
  Administered 2024-03-25: 2 [IU] via SUBCUTANEOUS
  Administered 2024-03-26: 3 [IU] via SUBCUTANEOUS
  Administered 2024-03-26: 5 [IU] via SUBCUTANEOUS
  Administered 2024-03-26 – 2024-03-27 (×2): 3 [IU] via SUBCUTANEOUS
  Administered 2024-03-27: 2 [IU] via SUBCUTANEOUS
  Administered 2024-03-27: 5 [IU] via SUBCUTANEOUS
  Administered 2024-03-27 (×3): 2 [IU] via SUBCUTANEOUS
  Administered 2024-03-28: 3 [IU] via SUBCUTANEOUS
  Filled 2024-03-20: qty 5
  Filled 2024-03-20: qty 2
  Filled 2024-03-20: qty 4
  Filled 2024-03-20: qty 1
  Filled 2024-03-20: qty 5
  Filled 2024-03-20: qty 3
  Filled 2024-03-20: qty 8
  Filled 2024-03-20: qty 3
  Filled 2024-03-20: qty 1
  Filled 2024-03-20: qty 5
  Filled 2024-03-20 (×2): qty 3
  Filled 2024-03-20: qty 1
  Filled 2024-03-20: qty 5
  Filled 2024-03-20 (×2): qty 2
  Filled 2024-03-20: qty 3
  Filled 2024-03-20: qty 1
  Filled 2024-03-20: qty 2
  Filled 2024-03-20: qty 1
  Filled 2024-03-20 (×2): qty 3
  Filled 2024-03-20: qty 4
  Filled 2024-03-20: qty 2
  Filled 2024-03-20: qty 3
  Filled 2024-03-20: qty 1

## 2024-03-20 MED ORDER — SODIUM PHOSPHATES 45 MMOLE/15ML IV SOLN
30.0000 mmol | Freq: Once | INTRAVENOUS | Status: DC
  Filled 2024-03-20: qty 10

## 2024-03-20 MED ORDER — INSULIN GLARGINE 100 UNIT/ML ~~LOC~~ SOLN
5.0000 [IU] | Freq: Every day | SUBCUTANEOUS | Status: DC
  Administered 2024-03-20 – 2024-03-28 (×8): 5 [IU] via SUBCUTANEOUS
  Filled 2024-03-20 (×11): qty 0.05

## 2024-03-20 NOTE — Telephone Encounter (Signed)
 Pharmacy Patient Advocate Encounter  Insurance verification completed.    The patient is insured through [facility]. Patient has Medicare and is not eligible for a copay card, but may be able to apply for patient assistance or Medicare RX Payment Plan (Patient Must reach out to their plan, if eligible for payment plan), if available.    Ran test claim for Vancomycin  125mg  caps and the current 10 day co-pay is $0.   This test claim was processed through [employer]- copay amounts may vary at other pharmacies due to [employer], or as the patient moves through the different stages of their insurance plan.

## 2024-03-20 NOTE — Progress Notes (Addendum)
 PHARMACY ANTIBIOTIC CONSULT NOTE   Deborah [name] a 59 y.o. female admitted on 03/17/2023 from SNF after syncopal episode with LOC.  Pt with ESBL Kleb PNA growing in blood culture. Pharmacy has been consulted for Merrem  dosing. Recent admit 12/9-20 for sepsis secondary to L thigh abscess s/p I&D 12/11.   Patient to come off of CRRT later this evening around 1900, then possible iHD on 1/15.  Plan: Continue Merrem  1g IV Q8H through 1/13 On 1/14, change to Merrem  1gm IV Q24H Oral vancomycin  ppx 125mg  PO/PTdaily to prevent C diff recurrence  Monitor dialysis plans, clinical progress, abx LOT    Allergies[1]  Filed Weights   03/18/24 0500 03/19/24 0354 03/20/24 0500  Weight: 103.1 kg (227 lb 4.7 oz) 100.9 kg (222 lb 7.1 oz) 100 kg (220 lb 7.4 oz)       Latest Ref Rng & Units 03/20/2024    5:07 AM 03/19/2024    3:47 AM 03/18/2024    9:30 PM  CBC  WBC 4.0 - 10.5 K/uL 9.4  14.1    Hemoglobin 12.0 - 15.0 g/dL 7.6  7.6  7.7   Hematocrit 36.0 - 46.0 % 22.8  23.5  24.1   Platelets 150 - 400 K/uL 66  82       CFP/MTZ 1/9 x1 Vanc 1/9 >> 1/10, restarted 1/11 >> Merrem  1/10 >  PO Vanc ppx 1/10 >   1/11 VR at 1938 = 21 1/12 VR at 0347 = 19  1/9 BCx: ESBL Kleb pneumo, unspecified GPR  1/10 C.diff - negative 1/10 MRSA PCR - negative  Deborah [name] D. Lendell, PharmD, BCPS, BCCCP 03/20/2024, 10:37 AM  ===========================  Addendum: Stopping CRRT early per RN d/t clotting Change Merrem  to 1gm IV Q24H starting tonight  Deborah [name] D. Lendell, PharmD, BCPS, BCCCP 03/20/2024, 11:52 AM     [1]  Allergies Allergen Reactions   Dorethia Jetty ] Other (See Comments)    Hx of stomach ulcer   Bentyl  [Dicyclomine ] Other (See Comments)    Chest pain   Gabapentin  Hives and [name]   Isovue  [Iopamidol ] Anaphylaxis, [name] and Other (See Comments)    11/28/17 Patient had seizure like activity and then 1 min code after 100 cc of isovue  300. Possible contrast allergy  vs vasovagal  episode  Cardiac Arrest   Nsaids Anaphylaxis and Other (See Comments)    Hx of stomach ulcers   Penicillins Itching, Palpitations and Other (See Comments)    Flushing (Red Skin) Laryngeal Edema   Reglan  [Metoclopramide ] Other (See Comments)    Tardive dyskinesia    Valium  [Diazepam ] [name]   Zestril [Lisinopril] Anaphylaxis and Swelling    Tongue and mouth swelling Laryngeal Edema   Tolectin [Tolmetin] Nausea And Vomiting, Nausea Only and Other (See Comments)    Irritates stomach ulcer   Heparin      HIT   Aspartame And Phenylalanine Hives   Flexeril  [Cyclobenzaprine ] Palpitations   Hibiclens  [Chlorhexidine  Gluconate] Dermatitis   Oxycontin  [Oxycodone ] Palpitations   Rifamycins Palpitations   Tylenol  [Acetaminophen ] Nausea And Vomiting, Nausea Only and Other (See Comments)    Irritates stomach ulcer Abdominal pain   Ultram  [Tramadol ] Nausea And Vomiting and Palpitations

## 2024-03-20 NOTE — Progress Notes (Signed)
 " [facility] Progress Note    Assessment/ Plan:    ESRD - on HD TTS - CRRT started 1/10 due to sig shock state and met acidosis. Will rinse back at the end of shift or if she clots (whichever comes first). Will revisit HD needs--tentatively planning for HD on Thursday provided she is stable but will reassess tomorrow   shock/ hypotension - s/p pressors - getting po midodrine  10 tid - GNR bacteremia likely the cause  Sepsis -secondary to klebsiella pn bacteremia -on meropenem  and vanc -also on po vanc -per primary service   Volume - UF as tolerated with midodrine  support   Anemia of esrd - follow, transfuse prn  -avoid IV Fe for now, starting aranesp  1/13   Gram neg bacteremia - K. Pneumoniae sepsis - keep RIJ TDC for now    Diarrhea - getting empiric po vanc - having diarrhea and concern for Cdif, neg testing 1/10  Wounds -mgmt per primary service  OP HD: [place] [facility] 4h  B400   96.1kg  TDC   Hep none Last OP HD 1/08, post wt 100kg  Discussed with ICU RN and primary service.    Subjective:   Patient seen and examined on CRRT. No acute events, tolerating CRRT Pressors: none Net neg ~1.1L   Objective:   BP 119/71   Pulse 92   Temp 98.2 F (36.8 C) (Axillary)   Resp 19   Wt 100 kg   LMP 10/10/2012   SpO2 99%   BMI 35.58 kg/m   Intake/Output Summary (Last 24 hours) at 03/20/2024 0930 Last data filed at 03/20/2024 9074 Gross per 24 hour  Intake 1458.36 ml  Output 2550 ml  Net -1091.64 ml   Weight change: -0.9 kg  Physical Exam: Gen: ill appearing CVS: RRR Resp: CTA BL Abd: obese, soft Ext: right BKA, trace dependent edema Neuro: drowsy but arousable Dialysis access: RIJ TDC in use  Imaging: No results found.   Labs: BMET Recent Labs  Lab 03/17/24 0828 03/17/24 1629 03/18/24 0447 03/18/24 0649 03/18/24 1535 03/19/24 0347 03/19/24 1643 03/20/24 0507  NA 135 135 133* 133* 134* 136 134* 136  K 4.5 4.7 4.3 4.0 3.7 3.7  4.0 3.7  CL 96* 96* 92*  --  96* 99 100 103  CO2 14* 19* 26  --  26 23 22 27   GLUCOSE 131* 168* 180*  --  131* 138* 160* 182*  BUN 22* 19 15  --  12 11 10 10   CREATININE 6.50* 5.59* 3.94*  --  2.90* 2.10* 1.55* 1.32*  CALCIUM  8.1* 7.7* 7.6*  --  7.2* 7.4* 7.5* 7.6*  PHOS 6.1* 5.5* 4.1  --  3.0 2.4* 3.2 1.8*   CBC Recent Labs  Lab 03/16/24 1711 03/16/24 1754 03/18/24 0447 03/18/24 0649 03/18/24 1535 03/18/24 1639 03/18/24 2130 03/19/24 0347 03/20/24 0507  WBC 11.6*   < > 12.6*  --  13.1*  --   --  14.1* 9.4  NEUTROABS 10.1*  --   --   --   --   --   --   --   --   HGB 7.6*   < > 7.9*   < > 6.6*  --  7.7* 7.6* 7.6*  HCT 23.3*   < > 23.6*   < > 20.5*  --  24.1* 23.5* 22.8*  MCV 96.3   < > 94.0  --  99.0  --   --  95.1 93.8  PLT 145*   < >  130*  --  93* 94*  --  82* 66*   < > = values in this interval not displayed.    Medications:     ascorbic acid   500 mg Per Tube BID   atorvastatin   10 mg Per Tube Daily   busPIRone   5 mg Per Tube TID   clotrimazole   1 Applicatorful Vaginal QHS   collagenase    Topical Daily   darbepoetin (ARANESP ) injection - DIALYSIS  150 mcg Subcutaneous Q Tue-1800   famotidine   20 mg Per Tube QHS   hydrocortisone  sod succinate (SOLU-CORTEF ) inj  100 mg Intravenous Q12H   insulin  aspart  1-3 Units Subcutaneous Q4H   midodrine   10 mg Per Tube Q8H   multivitamin  1 tablet Per Tube QHS   sodium chloride  flush  10-40 mL Intracatheter Q12H   sodium chloride  flush  3 mL Intravenous Q12H   thiamine   100 mg Per Tube Daily   vancomycin   125 mg Per Tube Daily   Or   vancomycin   125 mg Oral Daily      Ephriam Stank, MD [facility] 03/20/2024, 9:30 AM   "

## 2024-03-20 NOTE — Progress Notes (Addendum)
 eLink Physician-Brief Progress Note Patient Name: Deborah [name] DOB: 1965/12/10 MRN: [id number]   Date of Service  03/20/2024  HPI/Events of Note  CBGs in the 200's severe pain, refusing turns. Has had oxycodone  without relief. Pt is asking for dilaudid .   eICU Interventions  Add glargine Replace oxycodone  with home Dilaudid    2335 - .Persistent hyperglycemia, upgrade to moderate sliding scale  Intervention Category Intermediate Interventions: Hyperglycemia - evaluation and treatment  Atlantis Delong 03/20/2024, 9:03 PM

## 2024-03-20 NOTE — Progress Notes (Addendum)
 "  NAME:  Deborah [name], MRN:  [id number], DOB:  1965-06-13, LOS: 4 ADMISSION DATE:  03/16/2024, CONSULTATION DATE:  03/16/24 REFERRING MD:  Guillermina RAMAN., CHIEF COMPLAINT:  Syncopal event; +LOC; shock   History of Present Illness:  Deborah [name] is a 59 yo female with past medical history significant for PAF, HLD, right BKA, ESRD on HD (TTS) who presented from SNF to [facility] after syncopal episode with +LOC. Patient hemodynamically unstable in ED despite fluid resuscitation with 3L crystalloid and was subsequently started on vasopressors. Labs notable for metabolic acidosis, sCr 7.04, BUN 23, iCa 1.00, troponin 105, lactic acic 9.0, given 1 amp HCO3. On exam patient with diffuse abdominal pain, exam somewhat limited as she has chronic pain managed daily with Dilaudid . Patient started empirically on Cefepime , Vanc, Flagyl ; CT A/P pending, and syncope workup in progress. PCCM consulted for ICU admission.  Of note, patient recently hospitalized 02/14/2024-02/25/2024 for sepsis secondary to left thigh abscess and underwent I&D with gen surg 02/16/2024.  Pertinent Medical History:   Past Medical History:  Diagnosis Date   Acute back pain with sciatica, left    Acute back pain with sciatica, right    Acute encephalopathy 05/29/2022   Acute osteomyelitis of right calcaneus (HCC) 12/21/2022   Anemia, unspecified    Atrial fibrillation with RVR (HCC)-resolved 09/09/2022   Atypical chest pain 09/10/2021   Cancer (HCC)    Carcinoid tumor of duodenum (HCC)    Chest pain with normal coronary angiography 2019   Chronic a-fib (HCC) 09/09/2022   Chronic pain    Chronic systolic CHF (congestive heart failure) (HCC)    Dehiscence of amputation stump of right lower extremity (HCC) 01/29/2023   Diabetes mellitus    DKA (diabetic ketoacidosis) (HCC)    Drug-seeking behavior    21 hospitalizations and 14 CT a/p in 2 years for N/V and abdominal pain, demanding only IV dilaudid    Elevated troponin     chronic   Esophageal reflux    Fibromyalgia    Gastric ulcer    Gastroparesis    Gout    HCAP (healthcare-associated pneumonia) 06/19/2022   Hyperlipidemia    Hyperosmolar hyperglycemic state (HHS) (HCC) 05/11/2022   Hyperosmolar non-ketotic state due to type 2 diabetes mellitus (HCC) 05/11/2022   Hypertension    Hypomagnesemia    Lumbosacral stenosis    LVH (left ventricular hypertrophy)    Morbid obesity (HCC)    Nausea & vomiting 09/09/2022   NICM (nonischemic cardiomyopathy) (HCC)    PAF (paroxysmal atrial fibrillation) (HCC)    Sepsis (HCC) 11/23/2022   Stroke (HCC) 02/2011   Symptomatic anemia 12/14/2022   Thrombocytosis    Vitamin B12 deficiency anemia    Significant Hospital Events: Including procedures, antibiotic start and stop dates in addition to other pertinent events   1/9 ED s/p witnessed syncopal event w/ +LOC in shock, started on Levo/Vaso; Aline/CVC placed 1/10 CRRT, still on vaso, NE. Esclated to meropenem  for ESBL Klebsiella 1/12: off levophed , on vasopressin  1/13: off vasopressin   Interim History / Subjective:   Awake this morning, states she is at [facility] where she lives, and states I'm awake, when asked questions.   Objective    Blood pressure 119/71, pulse 92, temperature 98.2 F (36.8 C), temperature source Axillary, resp. rate 19, weight 100 kg, last menstrual period 10/10/2012, SpO2 99%.        Intake/Output Summary (Last 24 hours) at 03/20/2024 0804 Last data filed at 03/20/2024 0700 Gross per 24 hour  Intake 1424.35  ml  Output 2560 ml  Net -1135.65 ml   Filed Weights   03/18/24 0500 03/19/24 0354 03/20/24 0500  Weight: 103.1 kg 100.9 kg 100 kg    Examination: General: chronically ill appearing, lying in bed, awake HEENT: [place]/AT, eyes anicteric, coretrak Pulm: breathing comfortably on RA, clear throughout CV: S1S2, RRR GI: obese, soft, NT Extremities:  R BKA, L chronic venous stasis changes, heel wound covered. R fem CVC. Neuro:  awake, opens eyes and answers some questions when asked   Resolved Problem List:  Hypoglycemia due to sepsis, resolved   Assessment and Plan:   Septic shock due to Klebsiella pneumoniae bacteremia Acute metabolic encephalopathy due to sepsis Lactic acidosis due to sepsis Chronic Hypotension -Nephrology okay with keeping RIJ for now -Meropenem  1/10; vancomycin  discontinued 1/12 -PO vancomycin  due to risk of cdiff, negative  -Off Levophed  1/12, off Vasopressin  1/13 -Consider discontinuing stress dose steroids -Continue midodrine  10mg  TID   ESRD on HD (TTS) Chronic hypotension-takes midodrine  before dialysis; however reports taking daily hyperphosphatemia -con't CRRT-- has permacath from PTA -con't midodrine  10mg  q8h -appreciate nephrology's management  Anemia of chronic disease -drop in Hb may be dilutional. No evidence of DIC or bleeding -daily CBC; hgb stable -holding AC  History of Cdiff - cdiff negative - on prophylactic dose PO vanc  Syncope; suspect due to sepsis -echo: EF 60-605%, mild LVH, small pericardial effusion posteriorly   Troponin elevation; likely demand d/t shock state PAF-not on AC -holding AC  HLD - PTA statin  Acute abdominal pain, resolved  Hx duodenal carcinoid tumor s/p resection (2022) Hx gastric ulcer & gastritis (2024) Chronic gastroparesis -con't H2-blocker; trying to avoid PPI due to C diff risk on broad spectrum antibiotics -PRN pain meds  DM2; hypoglycemia from sepsis corrected with steroids -SSI PRN -goal BG 140-180  PVD s/p R BKA- stump is well healed -PT & OT once able to participate; not sure what baseline mobility is with baseline SNF residence  Sacral pressure injury -turns -unfortunately needs pressors -hard to keep that skin clear with frequent diarrhea  Possible vaginal yeast infection -Clotrimazole  cream daily x7 days  Anxiety/Depression -Resume home buspar , risperidone , trazodone  when appropriate> waiting for  mental status to normalize some  Chronic pain Hx fibromyalgia S/p I&D for left thigh abscess 02/16/24 -MMPR; schedule tylenol , robaxin ; oxy PRN  Drug-seeking behavior Opioid-use disorder Hx substance abuse-- as an OP following with [facility]  Wounds-- left thigh, R hip, lower abdomen, buttocks, L heel -turns, trying to get off pressors -WOC as needed  Full code; mother is surrogate management consultant.    Critical care time:  31 minutes    Lyric Hoar, PA-C [facility] For pager details, please see AMION or use Epic chat  After 1900, please call [facility] for cross coverage needs 03/20/2024, 8:04 AM     "

## 2024-03-20 NOTE — Evaluation (Signed)
 Clinical/Bedside Swallow Evaluation Patient Details  Name: Deborah [name] MRN: [id number] Date of Birth: Apr 09, 1965  Today's Date: 03/20/2024 Time: SLP Start Time (ACUTE ONLY): 0941 SLP Stop Time (ACUTE ONLY): 1003 SLP Time Calculation (min) (ACUTE ONLY): 22 min  Past Medical History:  Past Medical History:  Diagnosis Date   Acute back pain with sciatica, left    Acute back pain with sciatica, right    Acute encephalopathy 05/29/2022   Acute osteomyelitis of right calcaneus (HCC) 12/21/2022   Anemia, unspecified    Atrial fibrillation with RVR (HCC)-resolved 09/09/2022   Atypical chest pain 09/10/2021   Cancer (HCC)    Carcinoid tumor of duodenum (HCC)    Chest pain with normal coronary angiography 2019   Chronic a-fib (HCC) 09/09/2022   Chronic pain    Chronic systolic CHF (congestive heart failure) (HCC)    Dehiscence of amputation stump of right lower extremity (HCC) 01/29/2023   Diabetes mellitus    DKA (diabetic ketoacidosis) (HCC)    Drug-seeking behavior    21 hospitalizations and 14 CT a/p in 2 years for N/V and abdominal pain, demanding only IV dilaudid    Elevated troponin    chronic   Esophageal reflux    Fibromyalgia    Gastric ulcer    Gastroparesis    Gout    HCAP (healthcare-associated pneumonia) 06/19/2022   Hyperlipidemia    Hyperosmolar hyperglycemic state (HHS) (HCC) 05/11/2022   Hyperosmolar non-ketotic state due to type 2 diabetes mellitus (HCC) 05/11/2022   Hypertension    Hypomagnesemia    Lumbosacral stenosis    LVH (left ventricular hypertrophy)    Morbid obesity (HCC)    Nausea & vomiting 09/09/2022   NICM (nonischemic cardiomyopathy) (HCC)    PAF (paroxysmal atrial fibrillation) (HCC)    Sepsis (HCC) 11/23/2022   Stroke (HCC) 02/2011   Symptomatic anemia 12/14/2022   Thrombocytosis    Vitamin B12 deficiency anemia    Past Surgical History:  Past Surgical History:  Procedure Laterality Date   ABDOMINAL AORTOGRAM W/LOWER EXTREMITY  N/A 11/29/2022   Procedure: ABDOMINAL AORTOGRAM W/LOWER EXTREMITY;  Surgeon: Pearline Norman RAMAN, MD;  Location: [facility];  Service: Cardiovascular;  Laterality: N/A;   AMPUTATION Right 12/29/2022   Procedure: RIGHT BELOW KNEE AMPUTATION;  Surgeon: Harden Jerona GAILS, MD;  Location: [facility] OR;  Service: Orthopedics;  Laterality: Right;   AV FISTULA PLACEMENT Left 06/30/2022   Procedure: LEFT BRACHIOCEPHALIC ARTERIOVENOUS (AV) FISTULA CREATION;  Surgeon: Gretta Lonni PARAS, MD;  Location: [facility] OR;  Service: Vascular;  Laterality: Left;   BIOPSY  07/27/2019   Procedure: BIOPSY;  Surgeon: Rosalie Kitchens, MD;  Location: [facility]OSCOPY;  Service: Endoscopy;;   BIOPSY  07/30/2019   Procedure: BIOPSY;  Surgeon: Elicia Claw, MD;  Location: [facility]OS[facility];  Service: Gastroenterology;;   BIOPSY  04/28/2023   Procedure: BIOPSY;  Surgeon: Federico Rosario BROCKS, MD;  Location: [facility] ENDOSCOPY;  Service: Gastroenterology;;   CATARACT EXTRACTION  01/2014   CHOLECYSTECTOMY     COLONOSCOPY WITH PROPOFOL  N/A 07/30/2019   Procedure: COLONOSCOPY WITH PROPOFOL ;  Surgeon: Elicia Claw, MD;  Location: [facility];  Service: Gastroenterology;  Laterality: N/A;   ESOPHAGOGASTRODUODENOSCOPY N/A 07/27/2019   Procedure: ESOPHAGOGASTRODUODENOSCOPY (EGD);  Surgeon: Rosalie Kitchens, MD;  Location: [facility];  Service: Endoscopy;  Laterality: N/A;   ESOPHAGOGASTRODUODENOSCOPY N/A 07/26/2020   Procedure: ESOPHAGOGASTRODUODENOSCOPY (EGD);  Surgeon: Burnette Fallow, MD;  Location: [facility];  Service: Endoscopy;  Laterality: N/A;   ESOPHAGOGASTRODUODENOSCOPY (EGD) WITH PROPOFOL  N/A 08/02/2019   Procedure:  ESOPHAGOGASTRODUODENOSCOPY (EGD) WITH PROPOFOL ;  Surgeon: Elicia Claw, MD;  Location: [facility];  Service: Gastroenterology;  Laterality: N/A;   ESOPHAGOGASTRODUODENOSCOPY (EGD) WITH PROPOFOL  N/A 12/23/2022   Procedure: ESOPHAGOGASTRODUODENOSCOPY (EGD) WITH PROPOFOL ;  Surgeon: San Sandor GAILS, DO;  Location: [facility];   Service: Gastroenterology;  Laterality: N/A;   ESOPHAGOGASTRODUODENOSCOPY (EGD) WITH PROPOFOL  N/A 04/28/2023   Procedure: ESOPHAGOGASTRODUODENOSCOPY (EGD) WITH PROPOFOL ;  Surgeon: Federico Rosario BROCKS, MD;  Location: [facility] ENDOSCOPY;  Service: Gastroenterology;  Laterality: N/A;   FISTULA SUPERFICIALIZATION Left 12/31/2022   Procedure: LEFT ARM FISTULA TRANSPOSITION;  Surgeon: Sheree Penne Bruckner, MD;  Location: [facility] OR;  Service: Vascular;  Laterality: Left;   GIVENS CAPSULE STUDY N/A 12/23/2022   Procedure: GIVENS CAPSULE STUDY;  Surgeon: San Sandor GAILS, DO;  Location: [facility]OS[facility];  Service: Gastroenterology;  Laterality: N/A;   HEMOSTASIS CLIP PLACEMENT  08/02/2019   Procedure: HEMOSTASIS CLIP PLACEMENT;  Surgeon: Elicia Claw, MD;  Location: [facility];  Service: Gastroenterology;;   HOT HEMOSTASIS N/A 12/23/2022   Procedure: HOT HEMOSTASIS (ARGON PLASMA COAGULATION/BICAP);  Surgeon: San Sandor GAILS, DO;  Location: [facility] ENDOSCOPY;  Service: Gastroenterology;  Laterality: N/A;   INCISION AND DRAINAGE ABSCESS Left 02/16/2024   Procedure: INCISION AND DRAINAGE, ABSCESS;  Surgeon: Vernetta Berg, MD;  Location: [facility] OR;  Service: General;  Laterality: Left;  LEFT THIGH ABSCESS   IR FLUORO GUIDE CV LINE RIGHT  06/24/2022   IR US  GUIDE VASC ACCESS RIGHT  06/24/2022   POLYPECTOMY  07/30/2019   Procedure: POLYPECTOMY;  Surgeon: Elicia Claw, MD;  Location: [facility];  Service: Gastroenterology;;   POLYPECTOMY  08/02/2019   Procedure: POLYPECTOMY;  Surgeon: Elicia Claw, MD;  Location: [facility];  Service: Gastroenterology;;   HARLEY DILATION N/A 04/28/2023   Procedure: HARLEY DILATION;  Surgeon: Federico Rosario BROCKS, MD;  Location: [facility] ENDOSCOPY;  Service: Gastroenterology;  Laterality: N/A;   STUMP REVISION Right 02/02/2023   Procedure: REVISION RIGHT BELOW KNEE AMPUTATION;  Surgeon: Harden Jerona GAILS, MD;  Location[facility] OR;  Service: Orthopedics;  Laterality: Right;   HPI:  Ms.  Jeanmarie is a 59 yo female presenting 1/9 from SNF after syncopal episode with +LOC. Admitted with septic shock and K pneumoniae bacteremia. Started on pressors 1/9, CRRT 1/10. Cortrak 1/12. Previous swallow evals (2024, 2025) both with suspected functional oropharyngeal swallow but with pt reporting symptoms of dysphagia suspected to be esophageal in nature. Also impacted by altered mentation. PMH:  stroke, tardive dyskinesia, gastroparesis, GERD, esophageal dilation, substance abuse, duodenal carcinoid s/p resection (2022), gastric AVM, fibromyalgia, anxiety, NICM, HTN, IDDM, NASH, PAF, CHF, ESRD on HD, PVD s/p R BKA and recently hospitalized 02/14/2024-02/25/2024 for sepsis secondary to left thigh abscess and underwent I&D with gen surg 02/16/2024    Assessment / Plan / Recommendation  Clinical Impression  Pt is likely impacted by mentation but with no acceptance of any POs this morning with SLP. Not able to recommend a diet at this time. Diet currently ordered by MD - could consider offering if mentation allows, using aspiration and esophageal precautions. Would hold if any overt difficulties noted once pt starts accepting POs.   Pt kept her eyes closed but did respond intermittently to verbal prompts. She opened her mouth wide for oral care and no overt asymmetry was noted, but she is not following commands consistently to participate in oral motor exam yet. Attempted to offer POs but pt was no longer opening her oral cavity. She parted her lips but kept her teeth clenched, not allowing a spoon  or straw to pass through. Attempted to offer various consistencies with no acceptance. Note that in the past, pt has had relatively normal appearing oropharyngeal function that has been impacted by mentation, with suspicion for an underlying esophageal component as well.    SLP Visit Diagnosis: Dysphagia, unspecified (R13.10)      Swallow Evaluation Recommendations Recommendations:  (diet order per  MD) Medication Administration: Via alternative means Oral care recommendations: Oral care QID (4x/day);Staff/trained caregiver to provide oral care   Assistance Recommended at Discharge    Functional Status Assessment Patient has had a recent decline in their functional status and demonstrates the ability to make significant improvements in function in a reasonable and predictable amount of time.  Frequency and Duration min 2x/week  2 weeks       Prognosis Prognosis for improved oropharyngeal function: Good Barriers to Reach Goals: Cognitive deficits;Time post onset;Other (Comment) (suspect underlying esophageal component)      Swallow Study   General HPI: Deborah [name] is a 59 yo female presenting 1/9 from SNF after syncopal episode with +LOC. Admitted with septic shock and K pneumoniae bacteremia. Started on pressors 1/9, CRRT 1/10. Cortrak 1/12. Previous swallow evals (2024, 2025) both with suspected functional oropharyngeal swallow but with pt reporting symptoms of dysphagia suspected to be esophageal in nature. Also impacted by altered mentation. PMH:  stroke, tardive dyskinesia, gastroparesis, GERD, esophageal dilation, substance abuse, duodenal carcinoid s/p resection (2022), gastric AVM, fibromyalgia, anxiety, NICM, HTN, IDDM, NASH, PAF, CHF, ESRD on HD, PVD s/p R BKA and recently hospitalized 02/14/2024-02/25/2024 for sepsis secondary to left thigh abscess and underwent I&D with gen surg 02/16/2024 Type of Study: Bedside Swallow Evaluation Previous Swallow Assessment: see HPI Diet Prior to this Study: Regular;Thin liquids (Level 0);Cortrak/Small bore NG tube Temperature Spikes Noted: No Respiratory Status: Room air History of Recent Intubation: No Behavior/Cognition: Requires cueing Oral Cavity Assessment: Within Functional Limits Oral Care Completed by SLP: Yes Oral Cavity - Dentition: Adequate natural dentition;Missing dentition Vision:  (kept eyes closed) Self-Feeding  Abilities: Total assist Patient Positioning: Upright in bed Baseline Vocal Quality: Normal    Oral/Motor/Sensory Function Overall Oral Motor/Sensory Function: Other (comment) (not following commands consistentlt for direct assessment - will need further testing)   Ice Chips Ice chips: Impaired Presentation: Spoon Oral Phase Impairments: Poor awareness of bolus;Other (comment) (no acceptance)   Thin Liquid Thin Liquid: Impaired Presentation: Straw Oral Phase Impairments: Poor awareness of bolus;Other (comment) (no acceptance)    Nectar Thick Nectar Thick Liquid: Not tested   Honey Thick Honey Thick Liquid: Not tested   Puree Puree: Not tested   Solid     Solid: Not tested      Leita SAILOR., M.A. CCC-SLP Acute Rehabilitation Services Office: [phone]  Secure chat preferred  03/20/2024,10:36 AM

## 2024-03-21 DIAGNOSIS — N186 End stage renal disease: Secondary | ICD-10-CM

## 2024-03-21 DIAGNOSIS — R6521 Severe sepsis with septic shock: Secondary | ICD-10-CM | POA: Diagnosis not present

## 2024-03-21 DIAGNOSIS — I120 Hypertensive chronic kidney disease with stage 5 chronic kidney disease or end stage renal disease: Secondary | ICD-10-CM | POA: Diagnosis not present

## 2024-03-21 DIAGNOSIS — B961 Klebsiella pneumoniae [K. pneumoniae] as the cause of diseases classified elsewhere: Secondary | ICD-10-CM | POA: Diagnosis not present

## 2024-03-21 DIAGNOSIS — A419 Sepsis, unspecified organism: Secondary | ICD-10-CM | POA: Diagnosis not present

## 2024-03-21 LAB — GLUCOSE, CAPILLARY
Glucose-Capillary: 191 mg/dL — ABNORMAL HIGH (ref 70–99)
Glucose-Capillary: 197 mg/dL — ABNORMAL HIGH (ref 70–99)
Glucose-Capillary: 186 mg/dL — ABNORMAL HIGH (ref 70–99)
Glucose-Capillary: 191 mg/dL — ABNORMAL HIGH (ref 70–99)
Glucose-Capillary: 155 mg/dL — ABNORMAL HIGH (ref 70–99)
Glucose-Capillary: 83 mg/dL (ref 70–99)

## 2024-03-21 LAB — TYPE AND SCREEN
ABO/RH(D): O POS
Antibody Screen: NEGATIVE
Donor AG Type: NEGATIVE
Donor AG Type: NEGATIVE
Donor AG Type: NEGATIVE
Donor AG Type: NEGATIVE
Unit division: 0
Unit division: 0
Unit division: 0
Unit division: 0

## 2024-03-21 LAB — RENAL FUNCTION PANEL
Albumin: 2.1 g/dL — ABNORMAL LOW (ref 3.5–5.0)
Anion gap: 8 (ref 5–15)
BUN: 19 mg/dL (ref 6–20)
CO2: 25 mmol/L (ref 22–32)
Calcium: 7.6 mg/dL — ABNORMAL LOW (ref 8.9–10.3)
Chloride: 105 mmol/L (ref 98–111)
Creatinine, Ser: 1.84 mg/dL — ABNORMAL HIGH (ref 0.44–1.00)
GFR, Estimated: 31 mL/min — ABNORMAL LOW
Glucose, Bld: 208 mg/dL — ABNORMAL HIGH (ref 70–99)
Phosphorus: 2.3 mg/dL — ABNORMAL LOW (ref 2.5–4.6)
Potassium: 3.5 mmol/L (ref 3.5–5.1)
Sodium: 137 mmol/L (ref 135–145)

## 2024-03-21 LAB — CBC
HCT: 24.7 % — ABNORMAL LOW (ref 36.0–46.0)
Hemoglobin: 8 g/dL — ABNORMAL LOW (ref 12.0–15.0)
MCH: 31.3 pg (ref 26.0–34.0)
MCHC: 32.4 g/dL (ref 30.0–36.0)
MCV: 96.5 fL (ref 80.0–100.0)
Platelets: 82 K/uL — ABNORMAL LOW (ref 150–400)
RBC: 2.56 MIL/uL — ABNORMAL LOW (ref 3.87–5.11)
RDW: 20.3 % — ABNORMAL HIGH (ref 11.5–15.5)
WBC: 8.3 K/uL (ref 4.0–10.5)
nRBC: 3.4 % — ABNORMAL HIGH (ref 0.0–0.2)

## 2024-03-21 LAB — BPAM RBC
Blood Product Expiration Date: 202602012359
Blood Product Expiration Date: 202602022359
Blood Product Expiration Date: 202602092359
Blood Product Expiration Date: 202602092359
ISSUE DATE / TIME: 202601081148
ISSUE DATE / TIME: 202601102039
ISSUE DATE / TIME: 202601111653
Unit Type and Rh: 5100
Unit Type and Rh: 5100
Unit Type and Rh: 5100
Unit Type and Rh: 5100

## 2024-03-21 LAB — MAGNESIUM: Magnesium: 2.3 mg/dL (ref 1.7–2.4)

## 2024-03-21 MED ORDER — ORAL CARE MOUTH RINSE
15.0000 mL | OROMUCOSAL | Status: AC
  Administered 2024-03-21 – 2024-04-13 (×46): 15 mL via OROMUCOSAL

## 2024-03-21 MED ORDER — RISPERIDONE 1 MG PO TBDP
1.0000 mg | ORAL_TABLET | Freq: Every day | ORAL | Status: AC
  Administered 2024-03-21 – 2024-04-13 (×23): 1 mg via ORAL
  Filled 2024-03-21 (×28): qty 1

## 2024-03-21 MED ORDER — HYDROXYZINE HCL 25 MG PO TABS
25.0000 mg | ORAL_TABLET | ORAL | Status: AC | PRN
  Administered 2024-03-21 – 2024-04-10 (×19): 25 mg via ORAL
  Filled 2024-03-21 (×20): qty 1

## 2024-03-21 MED ORDER — HYDROMORPHONE HCL 1 MG/ML IJ SOLN
1.0000 mg | INTRAMUSCULAR | Status: DC | PRN
  Administered 2024-03-21 – 2024-03-25 (×15): 1 mg via INTRAVENOUS
  Filled 2024-03-21 (×15): qty 1

## 2024-03-21 MED ORDER — ORAL CARE MOUTH RINSE: 15.0000 mL | OROMUCOSAL | Status: AC | PRN

## 2024-03-21 MED ORDER — POTASSIUM PHOSPHATES 15 MMOLE/5ML IV SOLN
20.0000 mmol | Freq: Once | INTRAVENOUS | Status: AC
  Administered 2024-03-21: 20 mmol via INTRAVENOUS
  Filled 2024-03-21: qty 6.67

## 2024-03-21 MED ORDER — HYDROMORPHONE HCL 1 MG/ML IJ SOLN: 1.0000 mg | INTRAMUSCULAR | Status: DC | PRN

## 2024-03-21 NOTE — Progress Notes (Signed)
 Pt receives out-pt HD at [facility] on TTS 12:10 pm chair time. Will assist as needed.   Randine Mungo Dialysis Navigator [phone]

## 2024-03-21 NOTE — Plan of Care (Signed)
" °  Problem: Education: Goal: Knowledge of General Education information will improve Description: Including pain rating scale, medication(s)/side effects and non-pharmacologic comfort measures Outcome: Progressing   Problem: Clinical Measurements: Goal: Ability to maintain clinical measurements within normal limits will improve Outcome: Progressing Goal: Will remain free from infection Outcome: Progressing Goal: Diagnostic test results will improve Outcome: Progressing Goal: Respiratory complications will improve Outcome: Progressing Goal: Cardiovascular complication will be avoided Outcome: Progressing   Problem: Nutrition: Goal: Adequate nutrition will be maintained Outcome: Progressing   Problem: Elimination: Goal: Will not experience complications related to bowel motility Outcome: Progressing Goal: Will not experience complications related to urinary retention Outcome: Progressing   Problem: Coping: Goal: Ability to adjust to condition or change in health will improve Outcome: Progressing   Problem: Fluid Volume: Goal: Ability to maintain a balanced intake and output will improve Outcome: Progressing   Problem: Health Behavior/Discharge Planning: Goal: Ability to identify and utilize available resources and services will improve Outcome: Progressing Goal: Ability to manage health-related needs will improve Outcome: Progressing   Problem: Metabolic: Goal: Ability to maintain appropriate glucose levels will improve Outcome: Progressing   Problem: Nutritional: Goal: Maintenance of adequate nutrition will improve Outcome: Progressing   Problem: Skin Integrity: Goal: Risk for impaired skin integrity will decrease Outcome: Progressing   Problem: Tissue Perfusion: Goal: Adequacy of tissue perfusion will improve Outcome: Progressing   Problem: Health Behavior/Discharge Planning: Goal: Ability to manage health-related needs will improve Outcome: Not Progressing    Problem: Activity: Goal: Risk for activity intolerance will decrease Outcome: Not Progressing   Problem: Coping: Goal: Level of anxiety will decrease Outcome: Not Progressing   Problem: Pain Managment: Goal: General experience of comfort will improve and/or be controlled Outcome: Not Progressing   Problem: Safety: Goal: Ability to remain free from injury will improve Outcome: Not Progressing   Problem: Skin Integrity: Goal: Risk for impaired skin integrity will decrease Outcome: Not Progressing   Problem: Education: Goal: Ability to describe self-care measures that may prevent or decrease complications (Diabetes Survival Skills Education) will improve Outcome: Not Progressing Goal: Individualized Educational Video(s) Outcome: Not Progressing   Problem: Nutritional: Goal: Progress toward achieving an optimal weight will improve Outcome: Not Progressing   "

## 2024-03-21 NOTE — TOC Progression Note (Signed)
 Transition of Care [facility]) - Progression Note    Patient Details  Name: Deborah [name] MRN: [id number] Date of Birth: 1965-05-12  Transition of Care [facility]) CM/SW Contact  Inocente GORMAN Kindle, LCSW Phone Number: 03/21/2024, 10:25 AM  Clinical Narrative:    CSW following for medical stability to return to SNF.    Expected Discharge Plan: Long Term Nursing Home Barriers to Discharge: Continued Medical Work up               Expected Discharge Plan and Services       Living arrangements for the past 2 months: Skilled Nursing Facility                                       Social Drivers of Health (SDOH) Interventions SDOH Screenings   Food Insecurity: No Food Insecurity (03/18/2024)  Housing: Low Risk (03/18/2024)  Transportation Needs: No Transportation Needs (03/18/2024)  Utilities: Not At Risk (03/18/2024)  Recent Concern: Utilities - At Risk (02/18/2024)  Depression (PHQ2-9): Medium Risk (03/16/2023)  Financial Resource Strain: Low Risk (03/23/2021)   Received from [facility])  Social Connections: Low Risk (03/23/2021)   Received from [facility])  Stress: Low Risk (03/23/2021)   Received from [facility])  Tobacco Use: Low Risk (03/18/2024)    Readmission Risk Interventions    02/20/2024   11:05 AM 03/07/2023    4:19 PM 11/25/2022    5:16 PM  Readmission Risk Prevention Plan  Transportation Screening Complete Complete Complete  Medication Review (RN Care Manager) Complete Complete Complete  PCP or Specialist appointment within 3-5 days of discharge Complete Complete Complete  HRI or Home Care Consult Complete Complete Complete  SW Recovery Care/Counseling Consult Complete Complete Complete  Palliative Care Screening Not Applicable Complete Not Applicable  Skilled Nursing Facility Complete Not Applicable Not Applicable

## 2024-03-21 NOTE — Inpatient Diabetes Management (Signed)
 Inpatient Diabetes Program Recommendations  AACE/ADA: New Consensus Statement on Inpatient Glycemic Control (2015)  Target Ranges:  Prepandial:   less than 140 mg/dL      Peak postprandial:   less than 180 mg/dL (1-2 hours)      Critically ill patients:  140 - 180 mg/dL   Lab Results  Component Value Date   GLUCAP 197 (H) 03/21/2024   HGBA1C 4.3 (L) 02/15/2024    Review of Glycemic Control  Latest Reference Range & Units 03/20/24 19:33 03/20/24 23:12 03/21/24 03:16 03/21/24 07:28  Glucose-Capillary 70 - 99 mg/dL 759 (H) 730 (H) 808 (H) 197 (H)   Diabetes history: None Outpatient Diabetes medications:  None Current orders for Inpatient glycemic control:  Novolog  0-15 units q 4 hours Lantus  5 units daily Vital 1.5 cal 50 ml/hr Inpatient Diabetes Program Recommendations:    May consider adding Novolog  2 units q 4 hours to cover CHO in tube feeds.   Thanks,  Randall Bullocks, RN, BC-ADM Inpatient Diabetes Coordinator Pager [phone]  (8a-5p)

## 2024-03-21 NOTE — Progress Notes (Signed)
 " [facility] Progress Note    Assessment/ Plan:    ESRD - on HD TTS -s/p CRRT 1/10-1/13 given significant shock - plan for HD tomorrow, TTS moving forward   shock/ hypotension - s/p pressors - getting po midodrine  10 tid - GNR bacteremia likely the cause  Sepsis -secondary to klebsiella pn bacteremia -on meropenem  and vanc -also on po vanc -per primary service   Volume - UF as tolerated with midodrine  support   Anemia of esrd - follow, transfuse prn  -avoid IV Fe for now, started aranesp  1/13   Gram neg bacteremia - K. Pneumoniae sepsis - keep RIJ TDC for now    Diarrhea - getting empiric po vanc - had diarrhea and concern for Cdif, neg testing 1/10  Wounds -mgmt per primary service  OP HD: [place] T[facility] 4h  B400   96.1kg  TDC   Hep none Last OP HD 1/08, post wt 100kg     Subjective:   Patient seen and examined bedside. No acute events. Off CRRT as of yesterday, remains off pressors.   Objective:   BP 105/73   Pulse 96   Temp 98.2 F (36.8 C) (Oral)   Resp 20   Wt 100.3 kg   LMP 10/10/2012   SpO2 98%   BMI 35.69 kg/m   Intake/Output Summary (Last 24 hours) at 03/21/2024 0937 Last data filed at 03/21/2024 0800 Gross per 24 hour  Intake 1424.16 ml  Output 235 ml  Net 1189.16 ml   Weight change: 0.3 kg  Physical Exam: Gen: ill appearing CVS: RRR Resp: CTA BL Abd: obese, soft Ext: right BKA, trace dependent edema Neuro: drowsy Dialysis access: RIJ [facility]  Imaging: No results found.   Labs: BMET Recent Labs  Lab 03/18/24 0447 03/18/24 0649 03/18/24 1535 03/19/24 0347 03/19/24 1643 03/20/24 0507 03/20/24 1702 03/21/24 0524  NA 133* 133* 134* 136 134* 136 137 137  K 4.3 4.0 3.7 3.7 4.0 3.7 3.6 3.5  CL 92*  --  96* 99 100 103 104 105  CO2 26  --  26 23 22 27 24 25   GLUCOSE 180*  --  131* 138* 160* 182* 269* 208*  BUN 15  --  12 11 10 10 13 19   CREATININE 3.94*  --  2.90* 2.10* 1.55* 1.32* 1.45* 1.84*  CALCIUM  7.6*  --   7.2* 7.4* 7.5* 7.6* 7.4* 7.6*  PHOS 4.1  --  3.0 2.4* 3.2 1.8* 2.4* 2.3*   CBC Recent Labs  Lab 03/16/24 1711 03/16/24 1754 03/18/24 1535 03/18/24 1639 03/18/24 2130 03/19/24 0347 03/20/24 0507 03/21/24 0524  WBC 11.6*   < > 13.1*  --   --  14.1* 9.4 8.3  NEUTROABS 10.1*  --   --   --   --   --   --   --   HGB 7.6*   < > 6.6*  --  7.7* 7.6* 7.6* 8.0*  HCT 23.3*   < > 20.5*  --  24.1* 23.5* 22.8* 24.7*  MCV 96.3   < > 99.0  --   --  95.1 93.8 96.5  PLT 145*   < > 93* 94*  --  82* 66* 82*   < > = values in this interval not displayed.    Medications:     ascorbic acid   500 mg Per Tube BID   atorvastatin   10 mg Per Tube Daily   busPIRone   5 mg Per Tube TID   clotrimazole   1  Applicatorful Vaginal QHS   collagenase    Topical Daily   darbepoetin (ARANESP ) injection - DIALYSIS  150 mcg Subcutaneous Q Tue-1800   famotidine   20 mg Per Tube QHS   insulin  aspart  0-15 Units Subcutaneous Q4H   insulin  glargine  5 Units Subcutaneous QHS   midodrine   10 mg Per Tube Q8H   multivitamin  1 tablet Per Tube QHS   mouth rinse  15 mL Mouth Rinse 4 times per day   risperiDONE   1 mg Oral QHS   sodium chloride  flush  10-40 mL Intracatheter Q12H   sodium chloride  flush  3 mL Intravenous Q12H   thiamine   100 mg Per Tube Daily   vancomycin   125 mg Per Tube Daily   Or   vancomycin   125 mg Oral Daily      Ephriam Stank, MD [facility] 03/21/2024, 9:37 AM   "

## 2024-03-21 NOTE — Plan of Care (Signed)

## 2024-03-21 NOTE — Progress Notes (Addendum)
 "  NAME:  Deborah [name], MRN:  [id number], DOB:  May 18, 1965, LOS: 5 ADMISSION DATE:  03/16/2024, CONSULTATION DATE:  03/16/24 REFERRING MD:  Guillermina RAMAN., CHIEF COMPLAINT:  Syncopal event; +LOC; shock   History of Present Illness:  Deborah [name] is a 59 yo female with past medical history significant for PAF, HLD, right BKA, ESRD on HD (TTS) who presented from SNF to [facility] after syncopal episode with +LOC. Patient hemodynamically unstable in ED despite fluid resuscitation with 3L crystalloid and was subsequently started on vasopressors. Labs notable for metabolic acidosis, sCr 7.04, BUN 23, iCa 1.00, troponin 105, lactic acic 9.0, given 1 amp HCO3. On exam patient with diffuse abdominal pain, exam somewhat limited as she has chronic pain managed daily with Dilaudid . Patient started empirically on Cefepime , Vanc, Flagyl ; CT A/P pending, and syncope workup in progress. PCCM consulted for ICU admission.  Of note, patient recently hospitalized 02/14/2024-02/25/2024 for sepsis secondary to left thigh abscess and underwent I&D with gen surg 02/16/2024.  Pertinent Medical History:   Past Medical History:  Diagnosis Date   Acute back pain with sciatica, left    Acute back pain with sciatica, right    Acute encephalopathy 05/29/2022   Acute osteomyelitis of right calcaneus (HCC) 12/21/2022   Anemia, unspecified    Atrial fibrillation with RVR (HCC)-resolved 09/09/2022   Atypical chest pain 09/10/2021   Cancer (HCC)    Carcinoid tumor of duodenum (HCC)    Chest pain with normal coronary angiography 2019   Chronic a-fib (HCC) 09/09/2022   Chronic pain    Chronic systolic CHF (congestive heart failure) (HCC)    Dehiscence of amputation stump of right lower extremity (HCC) 01/29/2023   Diabetes mellitus    DKA (diabetic ketoacidosis) (HCC)    Drug-seeking behavior    21 hospitalizations and 14 CT a/p in 2 years for N/V and abdominal pain, demanding only IV dilaudid    Elevated troponin     chronic   Esophageal reflux    Fibromyalgia    Gastric ulcer    Gastroparesis    Gout    HCAP (healthcare-associated pneumonia) 06/19/2022   Hyperlipidemia    Hyperosmolar hyperglycemic state (HHS) (HCC) 05/11/2022   Hyperosmolar non-ketotic state due to type 2 diabetes mellitus (HCC) 05/11/2022   Hypertension    Hypomagnesemia    Lumbosacral stenosis    LVH (left ventricular hypertrophy)    Morbid obesity (HCC)    Nausea & vomiting 09/09/2022   NICM (nonischemic cardiomyopathy) (HCC)    PAF (paroxysmal atrial fibrillation) (HCC)    Sepsis (HCC) 11/23/2022   Stroke (HCC) 02/2011   Symptomatic anemia 12/14/2022   Thrombocytosis    Vitamin B12 deficiency anemia    Significant Hospital Events: Including procedures, antibiotic start and stop dates in addition to other pertinent events   1/9 ED s/p witnessed syncopal event w/ +LOC in shock, started on Levo/Vaso; Aline/CVC placed 1/10 CRRT, still on vaso, NE. Esclated to meropenem  for ESBL Klebsiella 1/12: off levophed , on vasopressin  1/13: off vasopressin , steroids, and CRRT 1/14: transfer out of ICU, TRH to pick up 1/15  Interim History / Subjective:   Awake and alert this morning, stating she has abdominal pain with turning that is chronic. Pain meds adjusted.   Objective    Blood pressure 107/85, pulse 96, temperature 98.2 F (36.8 C), temperature source Oral, resp. rate 14, weight 100.3 kg, last menstrual period 10/10/2012, SpO2 100%.        Intake/Output Summary (Last 24 hours) at 03/21/2024 [phone]  Last data filed at 03/21/2024 0800 Gross per 24 hour  Intake 1494.16 ml  Output 314 ml  Net 1180.16 ml   Filed Weights   03/19/24 0354 03/20/24 0500 03/21/24 0500  Weight: 100.9 kg 100 kg 100.3 kg    Examination: General: chronically ill appearing, lying in bed, awake HEENT: [place]/AT, eyes anicteric, coretrak Pulm: breathing comfortably on RA, clear throughout CV: S1S2, RRR GI: obese, soft, NT Extremities:  R BKA, L  chronic venous stasis changes, heel wound covered. R fem CVC. Neuro: awake, talking   Resolved Problem List:  Hypoglycemia due to sepsis, resolved  Acute on chronic hypotension, septic shock Lactic acidosis due to sepsis  Assessment and Plan:   Septic shock due to Klebsiella pneumoniae bacteremia Acute metabolic encephalopathy due to sepsis, improved Chronic Hypotension  -Nephrology okay with keeping RIJ for now -Meropenem  1/10; vancomycin  discontinued 1/12 -PO vancomycin  due to risk of cdiff, negative  -Off Levophed  1/12, off Vasopressin  and stress dose steroids since 1/13 -Continue midodrine  10mg  TID  -Speech therapy following, coretrack in place -PT/OT consult  Transfer to [facility]  ESRD on HD (TTS) Chronic hypotension-takes midodrine  before dialysis; however reports taking daily Hyperphosphatemia -CRRT stopped 1/13; plan for IHD Thursday 1/15 -con't midodrine  10mg  q8h -appreciate nephrology's management  Anemia of chronic disease -drop in Hb may be dilutional. No evidence of DIC or bleeding -daily CBC; hgb stable -holding AC  History of Cdiff - cdiff negative - on prophylactic dose PO vanc  Syncope; suspect due to sepsis -echo: EF 60-605%, mild LVH, small pericardial effusion posteriorly   Troponin elevation; likely demand d/t shock state PAF-not on AC -holding AC  HLD - PTA statin  Chronic pain Hx fibromyalgia Acute on chronic abdominal pain, resolved  Hx duodenal carcinoid tumor s/p resection (2022) Hx gastric ulcer & gastritis (2024) Chronic gastroparesis -con't H2-blocker; trying to avoid PPI due to C diff risk on broad spectrum antibiotics -On immediate release oxycodone  in place of Norco; scheduled dilaudid , with PRN breakthrough pain IV dilaudid    S/p I&D for left thigh abscess 02/16/24  DM2; hypoglycemia from sepsis corrected with steroids -SSI PRN -goal BG 140-180  PVD s/p R BKA- stump is well healed  Sacral pressure  injury -turns -unfortunately needs pressors -hard to keep that skin clear with frequent diarrhea  Possible vaginal yeast infection -Clotrimazole  cream daily x7 days  Anxiety/Depression -Resume home buspar , risperidone , trazodone  when appropriate> waiting for mental status to normalize some  Drug-seeking behavior Opioid-use disorder Hx substance abuse-- as an OP following with [facility]  Wounds-- left thigh, R hip, lower abdomen, buttocks, L heel -turns -WOC as needed  Full code; mother is runner, broadcasting/film/video.    Critical care time:  30 minutes    Jamaul Heist, PA-C [facility] For pager details, please see AMION or use Epic chat  After 1900, please call [facility] for cross coverage needs 03/21/2024, 8:14 AM     "

## 2024-03-21 NOTE — Progress Notes (Signed)
 Speech Language Pathology Treatment: Dysphagia  Patient Details Name: Deborah [name] MRN: [id number] DOB: [birth year] Today's Date: 03/21/2024 Time: 8652-8597 SLP Time Calculation (min) (ACUTE ONLY): 15 min  Assessment / Plan / Recommendation Clinical Impression  Plan: regular, thin liquids diet. No SLP f/u warranted at this time.  Patient seen by SLP for skilled treatment focused on dysphagia goals. Her mother was in the room during majority of session. Patient was becoming somewhat emotional asking for PO's. She didn't acknowledge yesterday's BSE with another SLP during which she refused all PO's offered. Today, she indicated she was allergic to applesauce and gelatin which seems more related to preference. She consumed PO's of thin liquids via consecutive straw sips, regular solids, puree solids. Mastication was mildly prolonged but complete. Even with rapid drinking of liquids, no overt s/s aspiration observed and swallow initiation appeared timely.    HPI HPI: Deborah [name] is a 59 yo female presenting 1/9 from SNF after syncopal episode with +LOC. Admitted with septic shock and K pneumoniae bacteremia. Started on pressors 1/9, CRRT 1/10. Cortrak 1/12. Previous swallow evals (2024, 2025) both with suspected functional oropharyngeal swallow but with pt reporting symptoms of dysphagia suspected to be esophageal in nature. Also impacted by altered mentation. PMH:  stroke, tardive dyskinesia, gastroparesis, GERD, esophageal dilation, substance abuse, duodenal carcinoid s/p resection (2022), gastric AVM, fibromyalgia, anxiety, NICM, HTN, IDDM, NASH, PAF, CHF, ESRD on HD, PVD s/p R BKA and recently hospitalized 02/14/2024-02/25/2024 for sepsis secondary to left thigh abscess and underwent I&D with gen surg 02/16/2024      SLP Plan  Discharge SLP treatment due to (comment);All goals met        Swallow Evaluation Recommendations         Recommendations  Diet recommendations:  Regular;Thin liquid Liquids provided via: Cup;Straw Medication Administration: Other (Comment) (as tolerated) Supervision: Patient able to self feed Compensations: Slow rate;Small sips/bites Postural Changes and/or Swallow Maneuvers: Seated upright 90 degrees                  Oral care BID   Set up Supervision/Assistance Dysphagia, unspecified (R13.10)     Discharge SLP treatment due to (comment);All goals met     Norleen IVAR Blase, MA, CCC-SLP Speech Therapy   03/21/2024, 3:06 PM

## 2024-03-21 NOTE — Progress Notes (Signed)
 eLink Physician-Brief Progress Note Patient Name: Deborah [name] DOB: April 03, 1965 MRN: [id number]   Date of Service  03/21/2024  HPI/Events of Note   Pt had increased pain and anxiety after bath resulting in tachycardia up to 160's. PRN meds given. All other VS stable. HR currently 120's. ECG done. Pt is now more calm. On day shift HR 80-low 100's.   Discussed with RN MAP is good. HR 120. HD in AM.   eICU Interventions  Watch for now, call back if worsens Has prn pain meds     Intervention Category Intermediate Interventions: Other:;Arrhythmia - evaluation and management  Jodelle ONEIDA Hutching 03/21/2024, 11:44 PM

## 2024-03-22 ENCOUNTER — Inpatient Hospital Stay (HOSPITAL_COMMUNITY)

## 2024-03-22 DIAGNOSIS — G9340 Encephalopathy, unspecified: Secondary | ICD-10-CM | POA: Diagnosis not present

## 2024-03-22 DIAGNOSIS — Z7189 Other specified counseling: Secondary | ICD-10-CM

## 2024-03-22 DIAGNOSIS — Z992 Dependence on renal dialysis: Secondary | ICD-10-CM | POA: Diagnosis not present

## 2024-03-22 DIAGNOSIS — S31104A Unspecified open wound of abdominal wall, left lower quadrant without penetration into peritoneal cavity, initial encounter: Secondary | ICD-10-CM | POA: Diagnosis not present

## 2024-03-22 DIAGNOSIS — B961 Klebsiella pneumoniae [K. pneumoniae] as the cause of diseases classified elsewhere: Secondary | ICD-10-CM | POA: Diagnosis not present

## 2024-03-22 DIAGNOSIS — N186 End stage renal disease: Secondary | ICD-10-CM | POA: Diagnosis not present

## 2024-03-22 DIAGNOSIS — Z515 Encounter for palliative care: Secondary | ICD-10-CM

## 2024-03-22 DIAGNOSIS — R55 Syncope and collapse: Secondary | ICD-10-CM

## 2024-03-22 DIAGNOSIS — R7881 Bacteremia: Secondary | ICD-10-CM | POA: Diagnosis not present

## 2024-03-22 DIAGNOSIS — I9589 Other hypotension: Secondary | ICD-10-CM

## 2024-03-22 LAB — GLUCOSE, CAPILLARY
Glucose-Capillary: 131 mg/dL — ABNORMAL HIGH (ref 70–99)
Glucose-Capillary: 135 mg/dL — ABNORMAL HIGH (ref 70–99)
Glucose-Capillary: 71 mg/dL (ref 70–99)
Glucose-Capillary: 76 mg/dL (ref 70–99)
Glucose-Capillary: 79 mg/dL (ref 70–99)
Glucose-Capillary: 91 mg/dL (ref 70–99)

## 2024-03-22 NOTE — Plan of Care (Signed)
   Problem: Activity: Goal: Risk for activity intolerance will decrease Outcome: Progressing   Problem: Nutrition: Goal: Adequate nutrition will be maintained Outcome: Progressing   Problem: Safety: Goal: Ability to remain free from injury will improve Outcome: Progressing

## 2024-03-22 NOTE — Evaluation (Signed)
 Physical Therapy Evaluation Patient Details Name: Deborah [name] MRN: [id number] DOB: 16-Jun-1965 Today's Date: 03/22/2024  History of Present Illness  Pt is a 59 y/o female presenting from SNF with syncope. Admited for shock and sepsis. Required CRRT 1/10-1/13.  PMH: R BKA, paroxysmal atrial fibrillation, diabetes mellitus type 2, chronic pain, hyperlipidemia, ESRD on HD  Clinical Impression  Pt is currently mobilizing near her baseline with increased pain and recent episodes of syncope. Pt declining activity at this time and declines repositioning in bed. Pt demonstrates active dorsiflexion/plantarflexion in supine position but is limited due to pain and swelling, declines further ROM on L LE. R LE has a BKA and demonstrates ability to partially clear from bed surface. Pt reports using hoyer at baseline and staff helps push manual wheelchair. Pt states I'd appreciate that when asked if she wanted to continue with therapy services. Will periodically follow up during hospital stay to encourage functional mobility. Pt is otherwise functioning near her baseline.        If plan is discharge home, recommend the following: Two people to help with walking and/or transfers;Two people to help with bathing/dressing/bathroom;Assistance with cooking/housework;Assistance with feeding;Direct supervision/assist for medications management;Direct supervision/assist for financial management;Assist for transportation;Help with stairs or ramp for entrance;Supervision due to cognitive status   Can travel by private vehicle        Equipment Recommendations None recommended by PT (Pt has appropriate equipment at faciliity)  Recommendations for Other Services       Functional Status Assessment Patient has had a recent decline in their functional status and/or demonstrates limited ability to make significant improvements in function in a reasonable and predictable amount of time     Precautions /  Restrictions Precautions Precautions: Fall Recall of Precautions/Restrictions: Impaired Precaution/Restrictions Comments: cortrak, watch vitals Restrictions Weight Bearing Restrictions Per Provider Order: No Other Position/Activity Restrictions: hx of R BKA      Mobility  Bed Mobility               General bed mobility comments: pt declined EOB d/t waiting for nurse to upgrade diet    Transfers                   General transfer comment: would need maximove    Ambulation/Gait                  Stairs            Wheelchair Mobility     Tilt Bed    Modified Rankin (Stroke Patients Only)       Balance Overall balance assessment:  (Not observed due to patient declining OOB activity.)                                           Pertinent Vitals/Pain Pain Assessment Pain Assessment: Faces Faces Pain Scale: Hurts a little bit Pain Location: Stomach Pain Descriptors / Indicators: Aching, Sore, Grimacing, Pressure, Tightness Pain Intervention(s): Limited activity within patient's tolerance, Monitored during session    Home Living Family/patient expects to be discharged to:: Skilled nursing facility                   Additional Comments: reports being at [facility] for [facility] months    Prior Function Prior Level of Function : Needs assist  Mobility Comments: Deitra OOB to wheelchair ADLs Comments: Staff assist with bed level bathing, dressing and toileting.     Extremity/Trunk Assessment   Upper Extremity Assessment Upper Extremity Assessment: Defer to OT evaluation RUE Deficits / Details: impaired shoulder flex to 25*/abduction25*, crepitation felt LUE Deficits / Details: impaired shoulder flex to 50*/abduction25*, crepitation felt    Lower Extremity Assessment Lower Extremity Assessment: Generalized weakness;RLE deficits/detail;LLE deficits/detail RLE Deficits / Details: R BKA, able to  partially clear from bed surface. Reports sensation WNL. LLE Deficits / Details: Demonstrates ankle dorsiflexion/plantarflexion in gravity eliminated position, declines mobility at the knee and hip due to swelling and discomfort in LE. Reports very sensitive to touch in tibial region.       Communication   Communication Communication: No apparent difficulties    Cognition Arousal: Alert Behavior During Therapy: WFL for tasks assessed/performed   PT - Cognitive impairments: Awareness, Attention, No family/caregiver present to determine baseline                         Following commands: Impaired Following commands impaired: Follows one step commands with increased time     Cueing Cueing Techniques: Verbal cues, Gestural cues     General Comments General comments (skin integrity, edema, etc.): VSS throughout. L LE edema with tight, shiny skin. Residual limb appears WNL.    Exercises     Assessment/Plan    PT Assessment Patient needs continued PT services  PT Problem List Decreased strength;Decreased mobility;Decreased safety awareness;Decreased range of motion;Decreased coordination;Decreased knowledge of precautions;Decreased activity tolerance;Decreased skin integrity;Decreased balance;Decreased knowledge of use of DME;Impaired sensation;Pain       PT Treatment Interventions DME instruction;Therapeutic exercise;Wheelchair mobility training;Gait training;Balance training;Stair training;Neuromuscular re-education;Functional mobility training;Therapeutic activities;Patient/family education    PT Goals (Current goals can be found in the Care Plan section)  Acute Rehab PT Goals Patient Stated Goal: Decrease pain PT Goal Formulation: With patient Time For Goal Achievement: 04/05/24 Potential to Achieve Goals: Good    Frequency Min 1X/week     Co-evaluation   Reason for Co-Treatment: For patient/therapist safety;Necessary to address cognition/behavior during  functional activity;To address functional/ADL transfers PT goals addressed during session: Strengthening/ROM OT goals addressed during session: Strengthening/ROM       AM-PAC PT 6 Clicks Mobility  Outcome Measure Help needed turning from your back to your side while in a flat bed without using bedrails?: Total Help needed moving from lying on your back to sitting on the side of a flat bed without using bedrails?: Total Help needed moving to and from a bed to a chair (including a wheelchair)?: Total Help needed standing up from a chair using your arms (e.g., wheelchair or bedside chair)?: Total Help needed to walk in hospital room?: Total Help needed climbing 3-5 steps with a railing? : Total 6 Click Score: 6    End of Session   Activity Tolerance: Patient limited by pain Patient left: in bed;with call bell/phone within reach;with bed alarm set Nurse Communication: Mobility status;Need for lift equipment PT Visit Diagnosis: Muscle weakness (generalized) (M62.81);Other abnormalities of gait and mobility (R26.89)    Time: 0950-1003 PT Time Calculation (min) (ACUTE ONLY): 13 min   Charges:   PT Evaluation $PT Eval High Complexity: 1 High   PT General Charges $$ ACUTE PT VISIT: 1 Visit         [name] Morel, PT, DPT  Acute Rehabilitation Services         Office: [phone]  Erek Kowal K Cedarius Kersh 03/22/2024, 11:32 AM

## 2024-03-22 NOTE — Consult Note (Signed)
 "   ORTHOPAEDIC CONSULTATION  REQUESTING PHYSICIAN: Dennise Lavada POUR, MD  Chief Complaint: Hypotension  HPI: Deborah [name] is a 59 y.o. female who presents with hypotension and tachycardia.  Patient is status post debridement of the left thigh with necrotizing fasciitis.  She is status post a right below-knee amputation and has had ulcerations on the left ankle and heel.  Past Medical History:  Diagnosis Date   Acute back pain with sciatica, left    Acute back pain with sciatica, right    Acute encephalopathy 05/29/2022   Acute osteomyelitis of right calcaneus (HCC) 12/21/2022   Anemia, unspecified    Atrial fibrillation with RVR (HCC)-resolved 09/09/2022   Atypical chest pain 09/10/2021   Cancer (HCC)    Carcinoid tumor of duodenum (HCC)    Chest pain with normal coronary angiography 2019   Chronic a-fib (HCC) 09/09/2022   Chronic pain    Chronic systolic CHF (congestive heart failure) (HCC)    Dehiscence of amputation stump of right lower extremity (HCC) 01/29/2023   Diabetes mellitus    DKA (diabetic ketoacidosis) (HCC)    Drug-seeking behavior    21 hospitalizations and 14 CT a/p in 2 years for N/V and abdominal pain, demanding only IV dilaudid    Elevated troponin    chronic   Esophageal reflux    Fibromyalgia    Gastric ulcer    Gastroparesis    Gout    HCAP (healthcare-associated pneumonia) 06/19/2022   Hyperlipidemia    Hyperosmolar hyperglycemic state (HHS) (HCC) 05/11/2022   Hyperosmolar non-ketotic state due to type 2 diabetes mellitus (HCC) 05/11/2022   Hypertension    Hypomagnesemia    Lumbosacral stenosis    LVH (left ventricular hypertrophy)    Morbid obesity (HCC)    Nausea & vomiting 09/09/2022   NICM (nonischemic cardiomyopathy) (HCC)    PAF (paroxysmal atrial fibrillation) (HCC)    Sepsis (HCC) 11/23/2022   Stroke (HCC) 02/2011   Symptomatic anemia 12/14/2022   Thrombocytosis    Vitamin B12 deficiency anemia    Past Surgical History:   Procedure Laterality Date   ABDOMINAL AORTOGRAM W/LOWER EXTREMITY N/A 11/29/2022   Procedure: ABDOMINAL AORTOGRAM W/LOWER EXTREMITY;  Surgeon: Pearline Norman RAMAN, MD;  Location: [facility];  Service: Cardiovascular;  Laterality: N/A;   AMPUTATION Right 12/29/2022   Procedure: RIGHT BELOW KNEE AMPUTATION;  Surgeon: Harden Jerona GAILS, MD;  Location[facility] OR;  Service: Orthopedics;  Laterality: Right;   AV FISTULA PLACEMENT Left 06/30/2022   Procedure: LEFT BRACHIOCEPHALIC ARTERIOVENOUS (AV) FISTULA CREATION;  Surgeon: Gretta Lonni PARAS, MD;  Location: [facility] OR;  Service: Vascular;  Laterality: Left;   BIOPSY  07/27/2019   Procedure: BIOPSY;  Surgeon: Rosalie Kitchens, MD;  Location: [facility] ENDOSCOPY;  Service: Endoscopy;;   BIOPSY  07/30/2019   Procedure: BIOPSY;  Surgeon: Elicia Claw, MD;  Location: [facility]OSCOPY;  Service: Gastroenterology;;   BIOPSY  04/28/2023   Procedure: BIOPSY;  Surgeon: Federico Rosario BROCKS, MD;  Location: [facility] ENDOSCOPY;  Service: Gastroenterology;;   CATARACT EXTRACTION  01/2014   CHOLECYSTECTOMY     COLONOSCOPY WITH PROPOFOL  N/A 07/30/2019   Procedure: COLONOSCOPY WITH PROPOFOL ;  Surgeon: Elicia Claw, MD;  Location: [facility]OS[facility];  Service: Gastroenterology;  Laterality: N/A;   ESOPHAGOGASTRODUODENOSCOPY N/A 07/27/2019   Procedure: ESOPHAGOGASTRODUODENOSCOPY (EGD);  Surgeon: Rosalie Kitchens, MD;  Location: [facility];  Service: Endoscopy;  Laterality: N/A;   ESOPHAGOGASTRODUODENOSCOPY N/A 07/26/2020   Procedure: ESOPHAGOGASTRODUODENOSCOPY (EGD);  Surgeon: Burnette Fallow, MD;  Location: [facility];  Service:  Endoscopy;  Laterality: N/A;   ESOPHAGOGASTRODUODENOSCOPY (EGD) WITH PROPOFOL  N/A 08/02/2019   Procedure: ESOPHAGOGASTRODUODENOSCOPY (EGD) WITH PROPOFOL ;  Surgeon: Elicia Claw, MD;  Location: [facility];  Service: Gastroenterology;  Laterality: N/A;   ESOPHAGOGASTRODUODENOSCOPY (EGD) WITH PROPOFOL  N/A 12/23/2022   Procedure: ESOPHAGOGASTRODUODENOSCOPY (EGD) WITH  PROPOFOL ;  Surgeon: San Sandor GAILS, DO;  Location: [facility];  Service: Gastroenterology;  Laterality: N/A;   ESOPHAGOGASTRODUODENOSCOPY (EGD) WITH PROPOFOL  N/A 04/28/2023   Procedure: ESOPHAGOGASTRODUODENOSCOPY (EGD) WITH PROPOFOL ;  Surgeon: Federico Rosario BROCKS, MD;  Location: [facility] ENDOSCOPY;  Service: Gastroenterology;  Laterality: N/A;   FISTULA SUPERFICIALIZATION Left 12/31/2022   Procedure: LEFT ARM FISTULA TRANSPOSITION;  Surgeon: Sheree Penne Bruckner, MD;  Location: [facility] OR;  Service: Vascular;  Laterality: Left;   GIVENS CAPSULE STUDY N/A 12/23/2022   Procedure: GIVENS CAPSULE STUDY;  Surgeon: San Sandor GAILS, DO;  Location: [facility];  Service: Gastroenterology;  Laterality: N/A;   HEMOSTASIS CLIP PLACEMENT  08/02/2019   Procedure: HEMOSTASIS CLIP PLACEMENT;  Surgeon: Elicia Claw, MD;  Location: [facility];  Service: Gastroenterology;;   HOT HEMOSTASIS N/A 12/23/2022   Procedure: HOT HEMOSTASIS (ARGON PLASMA COAGULATION/BICAP);  Surgeon: San Sandor GAILS, DO;  Location: [facility] ENDOSCOPY;  Service: Gastroenterology;  Laterality: N/A;   INCISION AND DRAINAGE ABSCESS Left 02/16/2024   Procedure: INCISION AND DRAINAGE, ABSCESS;  Surgeon: Vernetta Berg, MD;  Location: [facility] OR;  Service: General;  Laterality: Left;  LEFT THIGH ABSCESS   IR FLUORO GUIDE CV LINE RIGHT  06/24/2022   IR US  GUIDE VASC ACCESS RIGHT  06/24/2022   POLYPECTOMY  07/30/2019   Procedure: POLYPECTOMY;  Surgeon: Elicia Claw, MD;  Location: [facility];  Service: Gastroenterology;;   POLYPECTOMY  08/02/2019   Procedure: POLYPECTOMY;  Surgeon: Elicia Claw, MD;  Location: [facility];  Service: Gastroenterology;;   HARLEY DILATION N/A 04/28/2023   Procedure: HARLEY DILATION;  Surgeon: Federico Rosario BROCKS, MD;  Location: [facility] ENDOSCOPY;  Service: Gastroenterology;  Laterality: N/A;   STUMP REVISION Right 02/02/2023   Procedure: REVISION RIGHT BELOW KNEE AMPUTATION;  Surgeon: Harden Jerona GAILS, MD;  Location: [facility]  OR;  Service: Orthopedics;  Laterality: Right;   Social History   Socioeconomic History   Marital status: Married    Spouse name: Not on file   Number of children: Not on file   Years of education: Not on file   Highest education level: Not on file  Occupational History   Not on file  Tobacco Use   Smoking status: Never   Smokeless tobacco: Never  Vaping Use   Vaping status: Never Used  Substance and Sexual Activity   Alcohol use: No   Drug use: No   Sexual activity: Not Currently    Birth control/protection: None  Other Topics Concern   Not on file  Social History Narrative   ** Merged History Encounter **       Social Drivers of Health   Tobacco Use: Low Risk (03/18/2024)   Patient History    Smoking Tobacco Use: Never    Smokeless Tobacco Use: Never    Passive Exposure: Not on file  Financial Resource Strain: Low Risk (03/23/2021)   Received from [facility])   Financial Resource Strain  Food Insecurity: No Food Insecurity (03/18/2024)   Epic    Worried About Programme Researcher, Broadcasting/film/video in the Last Year: Never true    Ran Out of Food in the Last Year: Never true  Transportation Needs: No Transportation Needs (03/18/2024)   Epic  Lack of Transportation (Medical): No    Lack of Transportation (Non-Medical): No  Physical Activity: Not on file  Stress: Low Risk (03/23/2021)   Received from [facility])   Stress    Over the last 2 weeks, how often have you been bothered by the following problems: feeling nervous, anxious, on edge?: Not at all    Over the last 2 weeks, how often have you been bothered by the following problems: Not being able to stop or control worrying?: Not at all  Social Connections: Low Risk (03/23/2021)   [facility])   Social Connections    How often do you feel that you lack companionship?: Hardly ever    How often do you feel left out?: Hardly Ever    How often do you feel isolated  from others?: Hardly ever  Depression (PHQ2-9): Medium Risk (03/16/2023)   Depression (PHQ2-9)    PHQ-2 Score: 6  Alcohol Screen: Not on file  Housing: Low Risk (03/18/2024)   Epic    Unable to Pay for Housing in the Last Year: No    Number of Times Moved in the Last Year: 0    Homeless in the Last Year: No  Utilities: Not At Risk (03/18/2024)   Epic    Threatened with loss of utilities: No  Recent Concern: Utilities - At Risk (02/18/2024)   Epic    Threatened with loss of utilities: Already shut off  Health Literacy: Not on file   Family History  Problem Relation Age of Onset   Diabetes Mother    Diabetes Father    Heart disease Father    Diabetes Sister    Congestive Heart Failure Sister 40   Diabetes Brother    - negative except otherwise stated in the family history section Allergies[1] Prior to Admission medications  Medication Sig Start Date End Date Taking? Authorizing Provider  albuterol  (PROVENTIL ) (2.5 MG/3ML) 0.083% nebulizer solution Take 3 mLs (2.5 mg total) by nebulization every 6 (six) hours as needed for wheezing or shortness of breath. 04/06/19  Yes Arloa Suzen RAMAN, NP  allopurinol  (ZYLOPRIM ) 100 MG tablet Take 1 tablet (100 mg total) by mouth 2 (two) times daily. 11/30/22  Yes Lue Elsie BROCKS, MD  ascorbic acid  (VITAMIN C ) 500 MG tablet Take 500 mg by mouth daily.   Yes [provider]  atorvastatin  (LIPITOR) 10 MG tablet Take 10 mg by mouth every evening. 12/03/21  Yes [provider]  b complex-vitamin c -folic acid  (NEPHRO-VITE) 0.8 MG TABS tablet Take 1 tablet by mouth at bedtime. 06/17/23  Yes Danton Reyes DASEN, MD  busPIRone  (BUSPAR ) 10 MG tablet Take 10 mg by mouth 3 (three) times daily.   Yes [provider]  calcium  carbonate (TUMS - DOSED IN MG ELEMENTAL CALCIUM ) 500 MG chewable tablet Chew 1 tablet (200 mg of elemental calcium  total) by mouth 2 (two) times daily as needed for indigestion or heartburn. 06/17/23  Yes Danton Reyes DASEN, MD  cetirizine  (ZYRTEC ) 10 MG tablet Take 10 mg by mouth daily. 08/05/22  Yes [provider]  cholestyramine  light (PREVALITE ) 4 g packet Take 1 packet (4 g total) by mouth 2 (two) times daily. 06/17/23  Yes Danton Reyes DASEN, MD  diclofenac  Sodium (VOLTAREN ) 1 % GEL Apply 2 g topically 4 (four) times daily. 06/17/23  Yes Danton Reyes DASEN, MD  famotidine  (PEPCID ) 20 MG tablet Take 20 mg by mouth daily before breakfast.   Yes [provider]  fluticasone  (FLONASE ) 50 MCG/ACT nasal spray Place 2 sprays into both nostrils daily as needed for allergies or rhinitis. Patient taking differently: Place 2 sprays into both nostrils in the morning and at bedtime. 12/19/18  Yes Rai, Ripudeep K, MD  folic acid  (FOLVITE ) 1 MG tablet Take 1 tablet (1 mg total) by mouth daily. 01/10/23  Yes Danford, Lonni SQUIBB, MD  hydrocerin (EUCERIN) CREA Apply 1 Application topically 2 (two) times daily. Apply to L leg and foot 2 times daily 02/25/24  Yes Ghimire, Donalda HERO, MD  HYDROcodone -acetaminophen  (NORCO/VICODIN) 5-325 MG tablet Take 1-2 tablets by mouth every 6 (six) hours as needed for moderate pain (pain score 4-6). 02/25/24  Yes Ghimire, Donalda HERO, MD  hydrocortisone  cream 1 % Apply 1 Application topically 2 (two) times daily.   Yes [provider]  HYDROmorphone  (DILAUDID ) 2 MG tablet Take 1 tablet (2 mg total) by mouth every 6 (six) hours as needed for severe pain (pain score 7-10). 02/25/24  Yes Ghimire, Donalda HERO, MD  hydrOXYzine  (ATARAX ) 25 MG tablet Take 1 tablet (25 mg total) by mouth every 4 (four) hours as needed for anxiety. 06/17/23  Yes Danton Reyes DASEN, MD  hyoscyamine  (LEVSIN  SL) 0.125 MG SL tablet Place 1 tablet (0.125 mg total) under the tongue 3 (three) times daily. 06/17/23  Yes Danton Reyes DASEN, MD  loperamide  (IMODIUM ) 2 MG capsule Take 1 capsule (2 mg total) by mouth every 6 (six) hours. 06/17/23  Yes Danton Reyes DASEN, MD  methocarbamol  (ROBAXIN ) 500 MG  tablet Take 1 tablet (500 mg total) by mouth every 6 (six) hours as needed for muscle spasms. 01/10/23  Yes Danford, Lonni SQUIBB, MD  midodrine  (PROAMATINE ) 10 MG tablet Take 1 tablet (10 mg total) by mouth every Monday, Wednesday, and Friday with hemodialysis. 06/17/23  Yes Danton Reyes DASEN, MD  mirtazapine  (REMERON ) 15 MG tablet Take 15 mg by mouth at bedtime. 10/18/22  Yes [provider]  nicotine (NICODERM CQ - DOSED IN MG/24 HR) 7 mg/24hr patch Place 7 mg onto the skin daily.   Yes [provider]  Nutritional Supplements (,FEEDING SUPPLEMENT, PROSOURCE PLUS) liquid Take 30 mLs by mouth 2 (two) times daily between meals. 01/10/23  Yes Danford, Lonni SQUIBB, MD  Nystatin (GERHARDT'S BUTT CREAM) CREA Apply 1 Application topically 3 (three) times daily. 02/25/24  Yes Ghimire, Donalda HERO, MD  ondansetron  (ZOFRAN ) 4 MG tablet Take 4 mg by mouth every 8 (eight) hours as needed for nausea or vomiting.   Yes [provider]  pantoprazole  (PROTONIX ) 40 MG tablet Take 1 tablet (40 mg total) by mouth 2 (two) times daily. 02/18/23  Yes Raulkar, Sven SQUIBB, MD  promethazine  (PHENERGAN ) 25 MG tablet Take 1 tablet (25 mg total) by mouth 4 (four) times daily -  before meals and at bedtime. 06/17/23  Yes Danton Reyes DASEN, MD  risperiDONE  (RISPERDAL  M-TABS) 1 MG disintegrating tablet Take 1 tablet (1 mg total) by mouth at bedtime. 05/04/23  Yes Sebastian Toribio GAILS, MD  saccharomyces boulardii (FLORASTOR) 250 MG capsule Take 250 mg by mouth in the morning, at noon, and at bedtime.   Yes [provider]  sevelamer carbonate (RENVELA) 800 MG tablet Take 1,600 mg by mouth 3 (three) times daily with meals.   Yes [provider]  traZODone  (DESYREL ) 50 MG tablet Take 1 tablet (50 mg total) by mouth at bedtime. Patient taking differently: Take 75 mg by mouth at bedtime. 06/17/23  Yes Danton Reyes DASEN, MD  DG Chest Port 1 View Result Date: 03/22/2024 EXAM: 1 VIEW(S) XRAY OF  THE CHEST 03/22/2024 12:07:00 PM COMPARISON: 03/16/2024 CLINICAL HISTORY: SOB (shortness of breath) FINDINGS: LINES, TUBES AND DEVICES: Right chest wall dual lumen central venous catheter in place with tip terminating over the superior right atrium. Enteric tube in place coursing through the chest to the abdomen beyond the field-of-view. LUNGS AND PLEURA: Low lung volumes. No focal pulmonary opacity. No pleural effusion. No pneumothorax. HEART AND MEDIASTINUM: No acute abnormality of the cardiac and mediastinal silhouettes. BONES AND SOFT TISSUES: Degenerative changes of bilateral shoulders. Bilateral high-riding humeral heads with significant decrease in acromiohumeral interval. No acute osseous abnormality. IMPRESSION: 1. No acute findings. 2. Low lung volumes. 3. Advanced arthropathic changes involving both shoulders with suspected bilateral rotator cuff pathology resulting in high-riding humeral heads. Electronically signed by: Waddell Calk MD 03/22/2024 03:47 PM EST RP Workstation: [id number]   - pertinent xrays, CT, MRI studies were reviewed and independently interpreted  Positive ROS: All other systems have been reviewed and were otherwise negative with the exception of those mentioned in the HPI and as above.  Physical Exam: General: Alert, no acute distress Psychiatric: Patient is competent for consent with normal mood and affect Lymphatic: No axillary or cervical lymphadenopathy Cardiovascular: No pedal edema Respiratory: No cyanosis, no use of accessory musculature GI: No organomegaly, abdomen is soft and non-tender    Images:  @ENCIMAGES @  Labs:  Lab Results  Component Value Date   HGBA1C 4.3 (L) 02/15/2024   HGBA1C 5.7 (H) 02/04/2023   HGBA1C 8.1 (H) 06/19/2022   ESRSEDRATE 90 (H) 01/29/2023   ESRSEDRATE 119 (H) 12/21/2022   ESRSEDRATE 122 (H) 11/24/2022   CRP 5.9 (H) 02/11/2023   CRP 8.0 (H) 02/10/2023   CRP 8.2 (H) 02/09/2023   LABURIC 6.5 02/06/2023   LABURIC 6.7  11/28/2022   LABURIC 6.2 07/04/2022   REPTSTATUS 03/19/2024 FINAL 03/16/2024   GRAMSTAIN  02/16/2024    ABUNDANT WBC PRESENT, PREDOMINANTLY PMN RARE GRAM NEGATIVE RODS FEW GRAM POSITIVE RODS RARE GRAM POSITIVE COCCI    CULT (A) 03/16/2024    KLEBSIELLA PNEUMONIAE Confirmed Extended Spectrum Beta-Lactamase Producer (ESBL).  In bloodstream infections from ESBL organisms, carbapenems are preferred over piperacillin /tazobactam. They are shown to have a lower risk of mortality.    LABORGA KLEBSIELLA PNEUMONIAE 03/16/2024    Lab Results  Component Value Date   ALBUMIN  2.1 (L) 03/21/2024   ALBUMIN  2.2 (L) 03/20/2024   ALBUMIN  2.0 (L) 03/20/2024   LABURIC 6.5 02/06/2023   LABURIC 6.7 11/28/2022   LABURIC 6.2 07/04/2022        Latest Ref Rng & Units 03/21/2024    5:24 AM 03/20/2024    5:07 AM 03/19/2024    3:47 AM  CBC EXTENDED  WBC 4.0 - 10.5 K/uL 8.3  9.4  14.1   RBC 3.87 - 5.11 MIL/uL 2.56  2.43  2.47   Hemoglobin 12.0 - 15.0 g/dL 8.0  7.6  7.6   HCT 63.9 - 46.0 % 24.7  22.8  23.5   Platelets 150 - 400 K/uL 82  66  82     Neurologic: Patient does not have protective sensation bilateral lower extremities.   MUSCULOSKELETAL:   Skin: Examination patient has healthy granulation tissue of the open left thigh wound review of the CT scan does not show any tunneling or air in the soft tissue.  Semination of the left heel and lateral ankle shows superficial atrophic skin with peeling skin no full-thickness defect.  Assessment: Assessment: Healing left groin wound status postdebridement for necrotizing fasciitis with pressure ulceration to the left heel and lateral left ankle  Plan: Plan: Continue the Mepilex dressings to the left heel and left lateral ankle.  Orders placed for Vashe dressing changes daily to the left groin.  Thank you for the consult and the opportunity to see Ms. Cumi Sanagustin, MD [facility] [phone] 5:39 PM        [1]   Allergies Allergen Reactions   Dorethia Cardinal ] Other (See Comments)    Hx of stomach ulcer   Bentyl  [Dicyclomine ] Other (See Comments)    Chest pain   Gabapentin  Hives and [name]   Isovue  [Iopamidol ] Anaphylaxis, [name] and Other (See Comments)    11/28/17 Patient had seizure like activity and then 1 min code after 100 cc of isovue  300. Possible contrast allergy  vs vasovagal episode  Cardiac Arrest   Nsaids Anaphylaxis and Other (See Comments)    Hx of stomach ulcers   Penicillins Itching, Palpitations and Other (See Comments)    Flushing (Red Skin) Laryngeal Edema   Reglan  [Metoclopramide ] Other (See Comments)    Tardive dyskinesia    Valium  [Diazepam ] [name]   Zestril [Lisinopril] Anaphylaxis and Swelling    Tongue and mouth swelling Laryngeal Edema   Tolectin [Tolmetin] Nausea And Vomiting, Nausea Only and Other (See Comments)    Irritates stomach ulcer   Aspartame And Phenylalanine Hives   Flexeril  [Cyclobenzaprine ] Palpitations   Hibiclens  [Chlorhexidine  Gluconate] Dermatitis   Oxycontin  [Oxycodone ] Palpitations   Rifamycins Palpitations   Tylenol  [Acetaminophen ] Nausea And Vomiting, Nausea Only and Other (See Comments)    Irritates stomach ulcer Abdominal pain   Ultram  [Tramadol ] Nausea And Vomiting and Palpitations   "

## 2024-03-22 NOTE — Progress Notes (Signed)
 "  NAME:  Deborah [name], MRN:  [id number], DOB:  20-Apr-1965, LOS: 6 ADMISSION DATE:  03/16/2024, CONSULTATION DATE:  03/16/24 REFERRING MD:  Guillermina RAMAN., CHIEF COMPLAINT:  Syncopal event; +LOC; shock   History of Present Illness:  Deborah [name] is a 59 yo female with past medical history significant for PAF, HLD, right BKA, ESRD on HD (TTS) who presented from SNF to [facility] after syncopal episode with +LOC. Patient hemodynamically unstable in ED despite fluid resuscitation with 3L crystalloid and was subsequently started on vasopressors. Labs notable for metabolic acidosis, sCr 7.04, BUN 23, iCa 1.00, troponin 105, lactic acic 9.0, given 1 amp HCO3. On exam patient with diffuse abdominal pain, exam somewhat limited as she has chronic pain managed daily with Dilaudid . Patient started empirically on Cefepime , Vanc, Flagyl  Of note, patient recently hospitalized 02/14/2024-02/25/2024 for sepsis secondary to left thigh abscess and underwent I&D with gen surg 02/16/2024.  Pertinent Medical History:   Past Medical History:  Diagnosis Date   Acute back pain with sciatica, left    Acute back pain with sciatica, right    Acute encephalopathy 05/29/2022   Acute osteomyelitis of right calcaneus (HCC) 12/21/2022   Anemia, unspecified    Atrial fibrillation with RVR (HCC)-resolved 09/09/2022   Atypical chest pain 09/10/2021   Cancer (HCC)    Carcinoid tumor of duodenum (HCC)    Chest pain with normal coronary angiography 2019   Chronic a-fib (HCC) 09/09/2022   Chronic pain    Chronic systolic CHF (congestive heart failure) (HCC)    Dehiscence of amputation stump of right lower extremity (HCC) 01/29/2023   Diabetes mellitus    DKA (diabetic ketoacidosis) (HCC)    Drug-seeking behavior    21 hospitalizations and 14 CT a/p in 2 years for N/V and abdominal pain, demanding only IV dilaudid    Elevated troponin    chronic   Esophageal reflux    Fibromyalgia    Gastric ulcer    Gastroparesis    Gout     HCAP (healthcare-associated pneumonia) 06/19/2022   Hyperlipidemia    Hyperosmolar hyperglycemic state (HHS) (HCC) 05/11/2022   Hyperosmolar non-ketotic state due to type 2 diabetes mellitus (HCC) 05/11/2022   Hypertension    Hypomagnesemia    Lumbosacral stenosis    LVH (left ventricular hypertrophy)    Morbid obesity (HCC)    Nausea & vomiting 09/09/2022   NICM (nonischemic cardiomyopathy) (HCC)    PAF (paroxysmal atrial fibrillation) (HCC)    Sepsis (HCC) 11/23/2022   Stroke (HCC) 02/2011   Symptomatic anemia 12/14/2022   Thrombocytosis    Vitamin B12 deficiency anemia    Significant Hospital Events: Including procedures, antibiotic start and stop dates in addition to other pertinent events   1/9 ED s/p witnessed syncopal event w/ +LOC in shock, started on Levo/Vaso; Aline/CVC placed 1/10 CRRT, still on vaso, NE. Esclated to meropenem  for ESBL Klebsiella 1/12: off levophed , on vasopressin  1/13: off vasopressin , steroids, and CRRT 1/14: transfer out of ICU, TRH to pick up 1/15  Interim History / Subjective:   Awake and alert this morning, stating she has abdominal pain with turning that is chronic. Pain meds adjusted.   Objective    Blood pressure 105/64, pulse 98, temperature 97.9 F (36.6 C), temperature source Oral, resp. rate 16, weight 104.3 kg, last menstrual period 10/10/2012, SpO2 97%.        Intake/Output Summary (Last 24 hours) at 03/22/2024 1614 Last data filed at 03/22/2024 1147 Gross per 24 hour  Intake 487.66 ml  Output --  Net 487.66 ml   Filed Weights   03/20/24 0500 03/21/24 0500 03/22/24 0500  Weight: 100 kg 100.3 kg 104.3 kg    Examination: General: Lying in bed, awake not in distress, on her phone, conversational HEENT: NCAT Pulm: Bilateral chest wall movement, clear to auscultation  CV: S1-S2 normal, no murmur or regurg GI: Soft nontender nondistended   Extremities:  R BKA, L chronic venous stasis changes, heel wound covered. R fem  CVC. Neuro: AO X3, conversational, no focal deficits     Resolved Problem List:  Hypoglycemia due to sepsis, resolved  Acute on chronic hypotension, septic shock Lactic acidosis due to sepsis  Assessment and Plan:   59 year old female with history of PAF, HLD, right BKA, ESRD-HD (TTS) who presented from SNF with hypotension requiring vasopressors, and metabolic acidosis.  This morning patient is using her phone, conversational, hemodynamically stable, perfusing well, sats well on nasal cannula Labs briefly reviewed.  Looks like HD is scheduled for tomorrow.     Acute metabolic encephalopathy-improved Septic shock due to Klebsiella pneumonia bacteremia-improving Lactic acidosis due to above-improved ESRD on HD (TTS)-off CRRT, IHD on 1/15 Acute abdominal pain-resolved History of duodenal carcinoid tumor s/p resection 2022 History of gastric ulcer 2024 Chronic gastroparesis-on H2 blocker Anemia of chronic disease S/p I&D for left thigh abscess 02/16/2024       Plan - Patient is hemodynamically stable, perfusing well, conversational, using her phone, sats well on nasal cannula. -Continue meropenem  for 14-day course to end on 1/23 - P.o. Vanco prophylaxis end date on 1/24 - Continue H2 blocker, avoiding PPI due to high risk of C. difficile with antibiotics. - Glucose management - Monitor H&H, serial labs-monitor and replace electrolytes as needed - PT OT - No critical care needs at this time - Rest of management per primary team     "

## 2024-03-22 NOTE — Progress Notes (Signed)
 "                                                                                                                                                                                                                                                                                PROGRESS NOTE     Patient Demographics:    Deborah [name], is a 59 y.o. female, DOB - Jul 12, 1965, FMW:[id number]  Outpatient Primary MD for the patient is Pcp, No    LOS - 6  Admit date - 03/16/2024    Chief Complaint  Patient presents with   Loss of Consciousness       Brief Narrative (HPI from H&P)   59 yo female with past medical history significant for PAF, HLD, right BKA, ESRD on HD (TTS) who presented from SNF to [facility] after syncopal episode with +LOC. Patient hemodynamically unstable in ED despite fluid resuscitation with 3L crystalloid and was subsequently started on vasopressors. Labs notable for metabolic acidosis, sCr 7.04, BUN 23, iCa 1.00, troponin 105, lactic acic 9.0, given 1 amp HCO3. On exam patient with diffuse abdominal pain, exam somewhat limited as she has chronic pain managed daily with Dilaudid . Patient started empirically on Cefepime , Vanc, Flagyl ; CT A/P pending, and syncope workup in progress. PCCM consulted for ICU admission.   Of note, patient recently hospitalized 02/14/2024-02/25/2024 for sepsis secondary to left thigh abscess and underwent I&D with gen surg 02/16/2024.  She was diagnosed with sepsis and subsequently Klebsiella bacteremia she was placed on IV meropenem , due to low blood pressures and underlying ESRD she was seen by nephrology and underwent multiple sessions of CRRT in ICU, she was also placed on vancomycin  prophylactically to prevent reoccurrence of C. difficile.  She was somewhat stabilized and transferred to my care on 03/22/2024 on day 6 of her hospital stay, currently blood pressure in 80s, still has NG tube, minimally responsive with generalized pain including abdominal  pain.   Significant Hospital Events: Including procedures, antibiotic start and stop dates in addition to other pertinent events   1/9 ED s/p witnessed syncopal event w/ +LOC in shock, started on Levo/Vaso; Aline/CVC placed 1/10.  CT abdomen pelvis in the ER.  Nonacute.  1/10 CRRT, still on vaso, NE. Esclated to meropenem  for ESBL Klebsiella 1/11 echocardiogram EF 60%.  No acute changes 1/12: off levophed , on vasopressin  1/13: off vasopressin , steroids, and CRRT 1/14: transfer out of ICU, TRH to pick up 1/15    Subjective:    [name] today has, No headache, No chest pain, chronic lower extremity and abdominal pain - No Nausea, No new weakness tingling or numbness, no shortness of breath,   Assessment  & Plan :    Septic shock due to Klebsiella pneumoniae bacteremia, Acute metabolic encephalopathy due to sepsis, improved, Chronic Hypotension   -Nephrology okay with keeping RIJ for now, CT abdomen pelvis negative, follow chest x-ray, on IV meropenem  with stop date of 03/31/2024, on prophylactic oral vancomycin  due to previous history of C. difficile with the same stop date.  Initially required Levophed  and stress dose steroids, sepsis pathophysiology has resolved.  Still on midodrine  but takes that at home as well.  Continue to monitor, so far source of infection likely wounds according to the ICU team.  Will check repeat blood cultures on 03/22/2024 and monitor.    ESRD on HD (TTS) - nephrology on board initially required CRRT in ICU, defer HD to nephrology.  Chronic hypotension-takes midodrine  baseline continue, minimize narcotic intake.  Patient counseled.   Anemia of chronic disease  -drop in Hb may be dilutional. No evidence of DIC or bleeding, monitor   History of Cdiff  - cdiff negative,  on prophylactic dose PO vanc   Syncope; suspect due to sepsis  -echo: EF 60-605%, mild LVH, small pericardial effusion posteriorly    Troponin elevation; likely demand d/t shock  state, PAF-not on anticoagulation due to past history of GI bleed, troponin elevation due to demand mismatch from sepsis, trend is flat and in non-ACS pattern, echo shows preserved EF and no wall motion abnormality.  Chest pain-free no further workup.   HLD  - PTA statin   Chronic pain, Hx fibromyalgia, Acute on chronic abdominal pain, continue supportive care counseled to minimize narcotic intake as blood pressure at baseline is very low.  Hx duodenal carcinoid tumor s/p resection (2022), Hx gastric ulcer & gastritis (2024) - Chronic gastroparesis -con't H2-blocker; trying to avoid PPI due to C diff risk on broad spectrum antibiotics, monitor.     S/p I&D for left thigh abscess 02/16/24.  Requested Dr. Harden to reevaluate on 03/22/2024.   Drug-seeking behavior, Opioid-use disorder, Hx substance abuse-- as an OP following with [facility]   Wounds present on admission-- left thigh, R hip, lower abdomen, buttocks, L heel. turns, WOC as needed Dr. Harden to evaluate left thigh postop site.  PVD s/p R BKA- stump is well healed   Sacral pressure injury - turns, unfortunately needs pressors, hard to keep that skin clear with frequent diarrhea   Possible vaginal yeast infection  -Clotrimazole  cream daily x7 days   Anxiety/Depression -Resume home buspar , risperidone , trazodone  when appropriate> waiting for mental status to normalize some   DM2; hypoglycemia from sepsis corrected with steroids -SSI PRN -goal BG 140-180  Lab Results  Component Value Date   HGBA1C 4.3 (L) 02/15/2024   CBG (last 3)  Recent Labs    03/21/24 2311 03/22/24 0354 03/22/24 0812  GLUCAP 83 71 79        Condition - Extremely Guarded  Family Communication  : None present  Code Status : Full code  Consults  : PCCM, nephrology, Dr. Harden  PUD Prophylaxis :  Procedures  :           Disposition Plan  :    Status is: Inpatient   DVT Prophylaxis  :    Place and maintain sequential  compression device Start: 03/20/24 1800  Lab Results  Component Value Date   PLT 82 (L) 03/21/2024    Diet :  Diet Order             Diet renal/carb modified with fluid restriction Diet-HS Snack? Nothing; Fluid restriction: 1800 mL Fluid; Room service appropriate? Yes; Fluid consistency: Thin  Diet effective now                    Inpatient Medications  Scheduled Meds:  ascorbic acid   500 mg Per Tube BID   atorvastatin   10 mg Per Tube Daily   busPIRone   5 mg Per Tube TID   clotrimazole   1 Applicatorful Vaginal QHS   collagenase    Topical Daily   darbepoetin (ARANESP ) injection - DIALYSIS  150 mcg Subcutaneous Q Tue-1800   famotidine   20 mg Per Tube QHS   insulin  aspart  0-15 Units Subcutaneous Q4H   insulin  glargine  5 Units Subcutaneous QHS   midodrine   10 mg Per Tube Q8H   multivitamin  1 tablet Per Tube QHS   mouth rinse  15 mL Mouth Rinse 4 times per day   risperiDONE   1 mg Oral QHS   sodium chloride  flush  10-40 mL Intracatheter Q12H   sodium chloride  flush  3 mL Intravenous Q12H   thiamine   100 mg Per Tube Daily   vancomycin   125 mg Per Tube Daily   Or   vancomycin   125 mg Oral Daily   Continuous Infusions:  feeding supplement (VITAL 1.5 CAL) Stopped (03/21/24 1630)   meropenem  (MERREM ) IV Stopped (03/21/24 2248)   PRN Meds:.acetaminophen , HYDROmorphone  (DILAUDID ) injection, HYDROmorphone , hydrOXYzine , mouth rinse, oxyCODONE  **OR** [DISCONTINUED] oxyCODONE , polyethylene glycol, senna, sodium chloride  flush    Objective:   Vitals:   03/22/24 0000 03/22/24 0400 03/22/24 0500 03/22/24 0800  BP: (!) 92/57 (!) 87/57  104/63  Pulse: (!) 119 (!) 103  98  Resp: 19 18  18   Temp: 97.8 F (36.6 C) 99.3 F (37.4 C)  98 F (36.7 C)  TempSrc: Axillary Axillary  Oral  SpO2: 100% 100%  98%  Weight:   104.3 kg     Wt Readings from Last 3 Encounters:  03/22/24 104.3 kg  02/25/24 97.2 kg  06/20/23 114.5 kg     Intake/Output Summary (Last 24 hours) at  03/22/2024 1058 Last data filed at 03/22/2024 0409 Gross per 24 hour  Intake 247.66 ml  Output --  Net 247.66 ml     Physical Exam  Awake Alert, No new F.N deficits, right IJ HD catheter in place, NG tube in place [place].AT,PERRAL Supple Neck, No JVD,   Symmetrical Chest wall movement, Good air movement bilaterally, CTAB RRR,No Gallops,Rubs or new Murmurs,  +ve B.Sounds, Abd Soft, No tenderness,   No Cyanosis, Clubbing or edema     RN pressure injury documentation: Wound 02/19/24 0200 Pressure Injury Foot Left;Lateral Unstageable - Full thickness tissue loss in which the base of the injury is covered by slough (yellow, tan, gray, green or brown) and/or eschar (tan, brown or black) in the wound bed. (Active)     Wound 03/17/24 0045 Pressure Injury Thigh Left;Posterior Stage 2 -  Partial thickness loss of dermis presenting as a shallow open injury with a red,  pink wound bed without slough. (Active)     Wound 03/17/24 0045 Pressure Injury Hip Lateral;Right Stage 2 -  Partial thickness loss of dermis presenting as a shallow open injury with a red, pink wound bed without slough. (Active)     Wound 03/17/24 0045 Pressure Injury Abdomen Lower;Left Stage 2 -  Partial thickness loss of dermis presenting as a shallow open injury with a red, pink wound bed without slough. (Active)     Wound 03/17/24 1119 Pressure Injury Buttocks Right;Left;Medial Stage 3 -  Full thickness tissue loss. Subcutaneous fat may be visible but bone, tendon or muscle are NOT exposed. (Active)      Data Review:    Recent Labs  Lab 03/16/24 1711 03/16/24 1754 03/18/24 0447 03/18/24 0649 03/18/24 1535 03/18/24 1639 03/18/24 2130 03/19/24 0347 03/20/24 0507 03/21/24 0524  WBC 11.6*   < > 12.6*  --  13.1*  --   --  14.1* 9.4 8.3  HGB 7.6*   < > 7.9*   < > 6.6*  --  7.7* 7.6* 7.6* 8.0*  HCT 23.3*   < > 23.6*   < > 20.5*  --  24.1* 23.5* 22.8* 24.7*  PLT 145*   < > 130*  --  93* 94*  --  82* 66* 82*  MCV 96.3   <  > 94.0  --  99.0  --   --  95.1 93.8 96.5  MCH 31.4   < > 31.5  --  31.9  --   --  30.8 31.3 31.3  MCHC 32.6   < > 33.5  --  32.2  --   --  32.3 33.3 32.4  RDW 22.6*   < > 20.8*  --  20.2*  --   --  21.0* 20.8* 20.3*  LYMPHSABS 1.0  --   --   --   --   --   --   --   --   --   MONOABS 0.5  --   --   --   --   --   --   --   --   --   EOSABS 0.0  --   --   --   --   --   --   --   --   --   BASOSABS 0.0  --   --   --   --   --   --   --   --   --    < > = values in this interval not displayed.    Recent Labs  Lab 03/16/24 1711 03/16/24 1754 03/17/24 0122 03/17/24 0258 03/17/24 0639 03/17/24 0828 03/17/24 1627 03/17/24 1629 03/17/24 1835 03/18/24 0447 03/18/24 0645 03/18/24 0649 03/18/24 0902 03/18/24 1535 03/18/24 1639 03/19/24 0347 03/19/24 1643 03/20/24 0507 03/20/24 1702 03/21/24 0524  NA 135   < >  --    < >  --  135  --    < >  --  133*  --    < >  --    < >  --  136 134* 136 137 137  K 4.2   < >  --    < >  --  4.5  --    < >  --  4.3  --    < >  --    < >  --  3.7 4.0 3.7 3.6 3.5  CL 97*  --   --    < >  --  96*  --    < >  --  92*  --   --   --    < >  --  99 100 103 104 105  CO2 14*  --   --    < >  --  14*  --    < >  --  26  --   --   --    < >  --  23 22 27 24 25   ANIONGAP 25*  --   --    < >  --  25*  --    < >  --  15  --   --   --    < >  --  15 12 7 9 8   GLUCOSE 75  --   --    < >  --  131*  --    < >  --  180*  --   --   --    < >  --  138* 160* 182* 269* 208*  BUN 23*  --   --    < >  --  22*  --    < >  --  15  --   --   --    < >  --  11 10 10 13 19   CREATININE 7.04*  --   --    < >  --  6.50*  --    < >  --  3.94*  --   --   --    < >  --  2.10* 1.55* 1.32* 1.45* 1.84*  AST 35  --   --   --   --   --   --   --   --   --   --   --   --   --   --  15  --   --   --   --   ALT <5  --   --   --   --   --   --   --   --   --   --   --   --   --   --  <5  --   --   --   --   ALKPHOS 93  --   --   --   --   --   --   --   --   --   --   --   --   --   --  75  --    --   --   --   BILITOT 0.4  --   --   --   --   --   --   --   --   --   --   --   --   --  0.7 0.7  --   --   --   --   ALBUMIN  1.9*  --   --   --   --  2.1*  --    < >  --  2.2*  --   --   --    < >  --  1.9*  1.9* 1.8* 2.0* 2.2* 2.1*  DDIMER  --   --   --   --   --   --   --   --  0.63*  --   --   --   --   --  1.05*  --   --   --   --   --   LATICACIDVEN  --    < > 9.2*  --  8.2* 7.3* 4.1*  --   --   --   --   --  1.2  --   --   --   --   --   --   --   INR  --   --   --   --   --   --   --   --  1.9*  --   --   --   --   --  1.8*  --   --   --   --   --   AMMONIA  --   --   --   --   --   --   --   --   --   --  31  --   --   --   --   --   --   --   --   --   MG 2.0  --   --   --   --   --   --   --   --  2.1  --   --   --   --   --  2.1  --  2.3  --  2.3  PHOS 6.3*  --   --   --   --  6.1*  --    < >  --  4.1  --   --   --    < >  --  2.4* 3.2 1.8* 2.4* 2.3*  CALCIUM  7.8*  --   --    < >  --  8.1*  --    < >  --  7.6*  --   --   --    < >  --  7.4* 7.5* 7.6* 7.4* 7.6*   < > = values in this interval not displayed.      Recent Labs  Lab 03/16/24 1711 03/16/24 2018 03/17/24 0122 03/17/24 0258 03/17/24 9360 03/17/24 9171 03/17/24 1627 03/17/24 1629 03/17/24 1835 03/18/24 0447 03/18/24 0645 03/18/24 0902 03/18/24 1535 03/18/24 1639 03/19/24 0347 03/19/24 1643 03/20/24 0507 03/20/24 1702 03/21/24 0524  DDIMER  --   --   --   --   --   --   --   --  0.63*  --   --   --   --  1.05*  --   --   --   --   --   LATICACIDVEN  --    < > 9.2*  --  8.2* 7.3* 4.1*  --   --   --   --  1.2  --   --   --   --   --   --   --   INR  --   --   --   --   --   --   --   --  1.9*  --   --   --   --  1.8*  --   --   --   --   --   AMMONIA  --   --   --   --   --   --   --   --   --   --  31  --   --   --   --   --   --   --   --  MG 2.0  --   --   --   --   --   --   --   --  2.1  --   --   --   --  2.1  --  2.3  --  2.3  CALCIUM  7.8*  --   --    < >  --  8.1*  --    < >  --  7.6*  --   --     < >  --  7.4* 7.5* 7.6* 7.4* 7.6*   < > = values in this interval not displayed.    --------------------------------------------------------------------------------------------------------------- Lab Results  Component Value Date   CHOL 104 07/22/2019   HDL 30 (L) 07/22/2019   LDLCALC 37 07/22/2019   TRIG 185 (H) 07/22/2019   CHOLHDL 3.5 07/22/2019    Lab Results  Component Value Date   HGBA1C 4.3 (L) 02/15/2024   No results for input(s): TSH, T4TOTAL, FREET4, T3FREE, THYROIDAB in the last 72 hours. No results for input(s): VITAMINB12, FOLATE, FERRITIN, TIBC, IRON , RETICCTPCT in the last 72 hours. ------------------------------------------------------------------------------------------------------------------ Cardiac Enzymes No results for input(s): CKMB, TROPONINI, MYOGLOBIN in the last 168 hours.  Invalid input(s): CK  Radiology Report No results found.   Signature  -   Lavada Stank M.D on 03/22/2024 at 10:58 AM   -  To page go to [email or web address]   "

## 2024-03-22 NOTE — Progress Notes (Signed)
 " [facility] Progress Note    Assessment/ Plan:    ESRD - on HD TTS OP -s/p CRRT 1/10-1/13 given significant shock - refused HD today, will plan for HD tomorrow and take it from there   shock/ hypotension - s/p pressors - getting po midodrine  10 tid - GNR bacteremia likely the cause  Sepsis -secondary to klebsiella pn bacteremia -on meropenem  and vanc -also on po vanc -per primary service -repeat bcx--if not cleared then will need to attempt line holiday (has RIJ TDC)   Volume - UF as tolerated with midodrine  support   Anemia of esrd - follow, transfuse prn  -avoid IV Fe for now, started aranesp  1/13   Diarrhea - getting empiric po vanc - had diarrhea and concern for Cdif, neg testing 1/10  Wounds -mgmt per primary service  OP HD: [place] [facility] 4h  B400   96.1kg  TDC   Hep none Last OP HD 1/08, post wt 100kg  Discussed w/ primary service.    Subjective:   Patient seen and examined bedside. Out of ICU, more awake for me today. She reports that she is hurting all over and would like to do HD tomorrow which is fine. HD staff to come down to collect labs thru HD catheter   Objective:   BP (!) 104/58 (BP Location: Right Wrist)   Pulse 94   Temp 98 F (36.7 C) (Oral)   Resp 18   Wt 104.3 kg   LMP 10/10/2012   SpO2 98%   BMI 37.11 kg/m   Intake/Output Summary (Last 24 hours) at 03/22/2024 1149 Last data filed at 03/22/2024 1147 Gross per 24 hour  Intake 487.66 ml  Output --  Net 487.66 ml   Weight change: 4 kg  Physical Exam: Gen: ill appearing CVS: RRR Resp: normal wob/unlabored Abd: obese, soft Ext: right BKA, trace dependent edema Neuro: drowsy Dialysis access: RIJ [facility]  Imaging: No results found.   Labs: BMET Recent Labs  Lab 03/18/24 0447 03/18/24 0649 03/18/24 1535 03/19/24 0347 03/19/24 1643 03/20/24 0507 03/20/24 1702 03/21/24 0524  NA 133* 133* 134* 136 134* 136 137 137  K 4.3 4.0 3.7 3.7 4.0 3.7 3.6 3.5  CL 92*   --  96* 99 100 103 104 105  CO2 26  --  26 23 22 27 24 25   GLUCOSE 180*  --  131* 138* 160* 182* 269* 208*  BUN 15  --  12 11 10 10 13 19   CREATININE 3.94*  --  2.90* 2.10* 1.55* 1.32* 1.45* 1.84*  CALCIUM  7.6*  --  7.2* 7.4* 7.5* 7.6* 7.4* 7.6*  PHOS 4.1  --  3.0 2.4* 3.2 1.8* 2.4* 2.3*   CBC Recent Labs  Lab 03/16/24 1711 03/16/24 1754 03/18/24 1535 03/18/24 1639 03/18/24 2130 03/19/24 0347 03/20/24 0507 03/21/24 0524  WBC 11.6*   < > 13.1*  --   --  14.1* 9.4 8.3  NEUTROABS 10.1*  --   --   --   --   --   --   --   HGB 7.6*   < > 6.6*  --  7.7* 7.6* 7.6* 8.0*  HCT 23.3*   < > 20.5*  --  24.1* 23.5* 22.8* 24.7*  MCV 96.3   < > 99.0  --   --  95.1 93.8 96.5  PLT 145*   < > 93* 94*  --  82* 66* 82*   < > = values in this interval not displayed.  Medications:     ascorbic acid   500 mg Per Tube BID   atorvastatin   10 mg Per Tube Daily   busPIRone   5 mg Per Tube TID   clotrimazole   1 Applicatorful Vaginal QHS   collagenase    Topical Daily   darbepoetin (ARANESP ) injection - DIALYSIS  150 mcg Subcutaneous Q Tue-1800   famotidine   20 mg Per Tube QHS   insulin  aspart  0-15 Units Subcutaneous Q4H   insulin  glargine  5 Units Subcutaneous QHS   midodrine   10 mg Per Tube Q8H   multivitamin  1 tablet Per Tube QHS   mouth rinse  15 mL Mouth Rinse 4 times per day   risperiDONE   1 mg Oral QHS   sodium chloride  flush  10-40 mL Intracatheter Q12H   sodium chloride  flush  3 mL Intravenous Q12H   thiamine   100 mg Per Tube Daily   vancomycin   125 mg Per Tube Daily   Or   vancomycin   125 mg Oral Daily      Ephriam Stank, MD [facility] 03/22/2024, 11:49 AM   "

## 2024-03-22 NOTE — Evaluation (Signed)
 Occupational Therapy Evaluation Patient Details Name: Deborah [name] MRN: [id number] DOB: [birth year] Today's Date: 03/22/2024   History of Present Illness   Pt is a 59 y/o female presenting from SNF with syncope. Admited for shock and sepsis. Required CRRT 1/10-1/13.  PMH: R BKA, paroxysmal atrial fibrillation, diabetes mellitus type 2, chronic pain, hyperlipidemia, ESRD on HD     Clinical Impressions PTA, pt from SNF LTC, reports hoyer lift transfers OOB but previously able to assist to sit EOB. Pt reports bed level assist for ADLs at SNF. Pt presents now near this reported baseline but evaluation limited as pt perseverating on nurse to upgrade my diet. Pt with impaired B shoulder flexion impacting ADL/bed mobility abilities. Pt may benefit from acute OT trial for UE strengthening and gradual progression of bed mobility during ADLs. Recommend return to SNF upon DC w/ therapy follow up as needed.   HR 100s at rest     If plan is discharge home, recommend the following:   Two people to help with walking and/or transfers;Two people to help with bathing/dressing/bathroom;Assist for transportation     Functional Status Assessment   Patient has had a recent decline in their functional status and demonstrates the ability to make significant improvements in function in a reasonable and predictable amount of time.     Equipment Recommendations   None recommended by OT     Recommendations for Other Services         Precautions/Restrictions   Precautions Precautions: Fall Precaution/Restrictions Comments: cortrak, watch vitals Restrictions Weight Bearing Restrictions Per Provider Order: No Other Position/Activity Restrictions: hx of R BKA     Mobility Bed Mobility               General bed mobility comments: pt declined EOB d/t waiting for nurse to upgrade diet    Transfers                   General transfer comment: would need maximove       Balance                                           ADL either performed or assessed with clinical judgement   ADL Overall ADL's : Needs assistance/impaired Eating/Feeding: Set up;Bed level   Grooming: Bed level;Minimal assistance   Upper Body Bathing: Moderate assistance;Bed level   Lower Body Bathing: Total assistance;Bed level   Upper Body Dressing : Moderate assistance;Bed level Upper Body Dressing Details (indicate cue type and reason): assisting to don gown appropriately Lower Body Dressing: Total assistance;Bed level       Toileting- Clothing Manipulation and Hygiene: Total assistance;Bed level               Vision Ability to See in Adequate Light: 0 Adequate Patient Visual Report: No change from baseline Vision Assessment?: No apparent visual deficits     Perception         Praxis         Pertinent Vitals/Pain Pain Assessment Pain Assessment: Faces Faces Pain Scale: Hurts little more Pain Location: stomach Pain Descriptors / Indicators: Grimacing, Guarding Pain Intervention(s): Monitored during session, Limited activity within patient's tolerance     Extremity/Trunk Assessment Upper Extremity Assessment Upper Extremity Assessment: Right hand dominant;RUE deficits/detail;LUE deficits/detail RUE Deficits / Details: impaired shoulder flex to 25*/abduction25*, crepitation felt LUE Deficits / Details: impaired shoulder flex  to 50*/abduction25*, crepitation felt   Lower Extremity Assessment Lower Extremity Assessment: Defer to PT evaluation       Communication Communication Communication: No apparent difficulties   Cognition Arousal: Alert Behavior During Therapy: [facility] for tasks assessed/performed Cognition: No apparent impairments, No family/caregiver present to determine baseline             OT - Cognition Comments: able to answer questions appropriately but some decreased understanding of medical complexities                  Following commands: Impaired Following commands impaired: Follows one step commands with increased time     Cueing  General Comments   Cueing Techniques: Verbal cues;Gestural cues      Exercises     Shoulder Instructions      Home Living Family/patient expects to be discharged to:: Skilled nursing facility                                 Additional Comments: reports being at [facility] for 9 months      Prior Functioning/Environment Prior Level of Function : Needs assist             Mobility Comments: Hoyer OOB to wheelchair ADLs Comments: Staff assist with bed level bathing, dressing and toileting.    OT Problem List: Decreased strength;Decreased activity tolerance;Impaired balance (sitting and/or standing);Decreased range of motion;Decreased cognition;Decreased coordination   OT Treatment/Interventions: Self-care/ADL training;DME and/or AE instruction;Patient/family education;Therapeutic exercise;Therapeutic activities;Balance training;Energy conservation      OT Goals(Current goals can be found in the care plan section)   Acute Rehab OT Goals Patient Stated Goal: get my diet upgraded. feel better before i try to move OOB OT Goal Formulation: With patient Time For Goal Achievement: 04/05/24 Potential to Achieve Goals: Fair   OT Frequency:  Min 1X/week    Co-evaluation PT/OT/SLP Co-Evaluation/Treatment: Yes Reason for Co-Treatment: For patient/therapist safety;Necessary to address cognition/behavior during functional activity;To address functional/ADL transfers PT goals addressed during session: Strengthening/ROM OT goals addressed during session: Strengthening/ROM      AM-PAC OT 6 Clicks Daily Activity     Outcome Measure Help from another person eating meals?: A Little Help from another person taking care of personal grooming?: A Little Help from another person toileting, which includes using toliet, bedpan, or urinal?:  Total Help from another person bathing (including washing, rinsing, drying)?: A Lot Help from another person to put on and taking off regular upper body clothing?: A Lot Help from another person to put on and taking off regular lower body clothing?: Total 6 Click Score: 12   End of Session    Activity Tolerance: Other (comment) (self limiting) Patient left: in bed;with call bell/phone within reach;with bed alarm set  OT Visit Diagnosis: Muscle weakness (generalized) (M62.81)                Time: 9050-8996 OT Time Calculation (min): 14 min Charges:  OT General Charges $OT Visit: 1 Visit OT Evaluation $OT Eval Moderate Complexity: 1 Mod  Mliss NOVAK, OTR/L Acute Rehab Services Office: [phone]   Mliss Fish 03/22/2024, 11:10 AM

## 2024-03-22 NOTE — Consult Note (Signed)
 "                              Consultation Note Date: 03/22/2024   Patient Name: Deborah [name]  DOB: 03-13-1965  MRN: [id number]  Age / Sex: 59 y.o., female  PCP: Pcp, No Referring Physician: Dennise Lavada POUR, MD  Reason for Consultation: Establishing goals of care  HPI/Patient Profile: 59 y.o. female  with past medical history of PAF, right BKA, end-stage renal disease on dialysis, chronic pain in her abdomen and bilateral legs admitted on 03/16/2024 with syncopal episode and loss of consciousness at [facility].   In the ED, patient was found to have metabolic acidosis and lactic acid of 9, SIRS criteria, ultimately admitted for septic shock requiring pressors.  She was also recently hospitalized in December 2025 for sepsis secondary to left thigh abscess.  Chart review also shows that she was discharged from physical medicine and rehabilitation clinic for her pain management in January 2025 due to positive drug screen via oral swab.  Patient reports she is now seen by [facility] pain clinic.  Refused HD today due to pain.  PMT has been consulted to assist with goals of care conversation.  She was last seen by PMT for goals of care discussion in December 2024.  Clinical Assessment and Goals of Care:  I have reviewed medical records including EPIC notes, labs and imaging, discussed with RN and MD, assessed the patient and then at the bedside to discuss diagnosis prognosis, GOC, EOL wishes, disposition and options.  I introduced Palliative Medicine as specialized medical care for people living with serious illness. It focuses on providing relief from the symptoms and stress of a serious illness. The goal is to improve quality of life for both the patient and the family.  We discussed a brief life review of the patient and then focused on their current illness.   I attempted to elicit values and goals of care important to the patient.    Medical History Review and Understanding:  We discussed  patient's acute illness in the context of their chronic comorbidities.  Reviewed her pain medicine regimen with her.  She does not understand why Dilaudid  is currently at 2 mg, when she had asked to increase from 4 mg to 6 mg.  We discussed pending blood culture and monitoring for potential infections including her wounds.  Social History: Patient tells me she is still married, working on her divorce.  Functional and Nutritional State: Currently at her baseline functioning, has required [facility] lift and wheelchair for some time.  Requiring tube feeds.  Palliative Symptoms: -Fatigue -Pain rated 12 out of 10 and described as shocking. Worse in her abdomen and legs but also her hips and back -Itching and anxiety, improved yesterday with hydroxyzine  -Depression  Advance Directives: A detailed discussion regarding advanced directives was had.  Review GOC document on file from 02/2023.  Patient thought she completed HCPOA but does not recall ever having a notary to finalize documents for her.  She is agreeable to a consult for advanced directives today after review of [place] legal hierarchy of decision making.  She strongly does NOT want her husband making any decisions on her behalf.  Code Status: Concepts specific to code status, artifical feeding and hydration, and rehospitalization were considered and discussed.   Discussion: Patient describes her quality of life as fair, as expected.  She does not know what is going well, just taking  things 1 day at a time.  What is going poorly is her ongoing pain and fatigue.  Since her last discussion with PMT, she describes life as crazy trying to acclimate to dialysis and other needs to stay healthy.  She is trying to stay positive, stating this too shall pass.  She requested a change to her diet order, which RN relayed to MD.  Also requested some birthday snacks from [employer].  Patient is agreeable to discussion of goals of care.  After review of her current  illness and exploration of her care preferences if she were to decline further, she tells me that she still has not made up her mind on whether to emphasize quantity or quality of life.  She is not sure if she is ready for end-of-life.  Sometimes she feels that she wants it all to be over and she attributes this to depression.  Sometimes she feels reassured by her support system telling her to hang in there and she is glad she has more life when she follows their advice.   She initially tells me that she would not want a permanent feeding tubes or any more surgical procedures but she would be agreeable to ventilator support.  She tells me I would not have a choice if a ventilator was required to live.  Counseled at length and she ultimately would be open to any interventions if required to live, though she would prefer not to have more surgeries or feeding tube.  Emotional support and therapeutic listening was provided as she considered her boundaries and limitations.  She confirms that nothing is off the table at this time.   The difference between aggressive medical intervention and comfort care was considered in light of the patient's goals of care. Hospice and Palliative Care services outpatient were explained and offered.   Discussed the importance of continued conversation with family and the medical providers regarding overall plan of care and treatment options, ensuring decisions are within the context of the patients values and GOCs.   Questions and concerns were addressed.  Hard Choices booklet left for review. The family was encouraged to call with questions or concerns.  PMT will continue to support holistically.   SUMMARY OF RECOMMENDATIONS   - Continue full code/full scope treatment - Patient is open to all aggressive life-prolonging measures as indicated.  She has historically had a hard time considering de-escalation of care - Spiritual care consult for assistance with advance  directives - Psychosocial and emotional support provided - Defer pain management and diet order to primary attending - Ongoing goals of care discussions and palliative support   Prognosis:  Poor  Discharge Planning: To Be Determined      Primary Diagnoses: Present on Admission:  Syncope  Hypotension   Physical Exam Vitals and nursing note reviewed.  Constitutional:      General: She is not in acute distress.    Appearance: She is ill-appearing.     Comments: Cortrak in place  HENT:     Head: Normocephalic and atraumatic.  Cardiovascular:     Rate and Rhythm: Normal rate.  Pulmonary:     Effort: No respiratory distress.  Neurological:     Mental Status: She is alert.  Psychiatric:        Mood and Affect: Mood normal.        Behavior: Behavior is cooperative.        Cognition and Memory: Cognition normal.    Vital Signs: BP 105/64 (BP  Location: Right Wrist)   Pulse 98   Temp 97.9 F (36.6 C) (Oral)   Resp 16   Wt 104.3 kg   LMP 10/10/2012   SpO2 97%   BMI 37.11 kg/m  Pain Scale: 0-10 POSS *See Group Information*: 1-Acceptable,Awake and alert Pain Score: 10-Worst pain ever   SpO2: SpO2: 97 % O2 Device:SpO2: 97 % O2 Flow Rate: .O2 Flow Rate (L/min): 2 L/min    Ilias Stcharles SHAUNNA Fell, PA-C  Palliative Medicine Team Team phone # [phone]  Thank you for allowing the Palliative Medicine Team to assist in the care of this patient. Please utilize secure chat with additional questions, if there is no response within 30 minutes please call the above phone number.  Palliative Medicine Team providers are available by phone from 7am to 7pm daily and can be reached through the team cell phone.  Should this patient require assistance outside of these hours, please call the patient's attending physician.   I personally spent a total of 80 minutes in the care of the patient today including preparing to see the patient, getting/reviewing separately obtained history,  performing a medically appropriate exam/evaluation, counseling and educating, referring and communicating with other health care professionals, documenting clinical information in the EHR, and coordinating care.   "

## 2024-03-22 NOTE — Progress Notes (Signed)
 Pt refused dialysis. MD Dennise notified.

## 2024-03-23 DIAGNOSIS — R55 Syncope and collapse: Secondary | ICD-10-CM | POA: Diagnosis not present

## 2024-03-23 DIAGNOSIS — A4159 Other Gram-negative sepsis: Secondary | ICD-10-CM

## 2024-03-23 LAB — GLUCOSE, CAPILLARY
Glucose-Capillary: 123 mg/dL — ABNORMAL HIGH (ref 70–99)
Glucose-Capillary: 82 mg/dL (ref 70–99)
Glucose-Capillary: 157 mg/dL — ABNORMAL HIGH (ref 70–99)
Glucose-Capillary: 250 mg/dL — ABNORMAL HIGH (ref 70–99)
Glucose-Capillary: 194 mg/dL — ABNORMAL HIGH (ref 70–99)

## 2024-03-23 MED ORDER — ANTICOAGULANT SODIUM CITRATE 4% (200MG/5ML) IV SOLN: 5.0000 mL | Status: DC | PRN

## 2024-03-23 MED ORDER — ALTEPLASE 2 MG IJ SOLR: 2.0000 mg | Freq: Once | INTRAMUSCULAR | Status: DC | PRN

## 2024-03-23 MED ORDER — LIDOCAINE HCL (PF) 1 % IJ SOLN: 5.0000 mL | INTRAMUSCULAR | Status: DC | PRN

## 2024-03-23 MED ORDER — DEXTROSE 5 % IV SOLN
60.0000 mg/h | INTRAVENOUS | Status: AC
  Administered 2024-03-23: 60 mg/h via INTRAVENOUS
  Filled 2024-03-23: qty 450

## 2024-03-23 MED ORDER — DILTIAZEM HCL 25 MG/5ML IV SOLN
10.0000 mg | Freq: Four times a day (QID) | INTRAVENOUS | Status: DC | PRN
  Administered 2024-03-23: 10 mg via INTRAVENOUS
  Filled 2024-03-23: qty 5

## 2024-03-23 MED ORDER — DILTIAZEM HCL 30 MG PO TABS
30.0000 mg | ORAL_TABLET | Freq: Two times a day (BID) | ORAL | Status: DC
  Administered 2024-03-23 – 2024-03-24 (×3): 30 mg via ORAL
  Filled 2024-03-23 (×3): qty 1

## 2024-03-23 MED ORDER — HYDROMORPHONE HCL 2 MG PO TABS
1.0000 mg | ORAL_TABLET | Freq: Four times a day (QID) | ORAL | Status: DC | PRN
  Administered 2024-03-25 – 2024-03-28 (×8): 1 mg via ORAL
  Filled 2024-03-23 (×8): qty 1

## 2024-03-23 MED ORDER — DEXTROSE 5 % IV SOLN
30.0000 mg/h | INTRAVENOUS | Status: DC
  Administered 2024-03-24 – 2024-03-25 (×3): 30 mg/h via INTRAVENOUS
  Filled 2024-03-23 (×3): qty 450

## 2024-03-23 MED ORDER — BOOST / RESOURCE BREEZE PO LIQD CUSTOM
1.0000 | ORAL | Status: DC
  Administered 2024-03-26: 1 via ORAL

## 2024-03-23 MED ORDER — PENTAFLUOROPROP-TETRAFLUOROETH EX AERO: 1.0000 | INHALATION_SPRAY | CUTANEOUS | Status: DC | PRN

## 2024-03-23 MED ORDER — LIDOCAINE-PRILOCAINE 2.5-2.5 % EX CREA: 1.0000 | TOPICAL_CREAM | CUTANEOUS | Status: DC | PRN

## 2024-03-23 MED ORDER — RENA-VITE PO TABS
1.0000 | ORAL_TABLET | Freq: Every day | ORAL | Status: AC
  Administered 2024-03-23 – 2024-04-13 (×19): 1 via ORAL
  Filled 2024-03-23 (×22): qty 1

## 2024-03-23 NOTE — Progress Notes (Signed)
 Rounding for routine line care, unable to assess central line at this time as patient being bathed by care team.

## 2024-03-23 NOTE — Progress Notes (Signed)
 Patient refused for dressing change as well as Q2 position change stating she is comfortable laying like this and will let the RN know when she is ready for wound care.   Patient also refused for morning labs.

## 2024-03-23 NOTE — Progress Notes (Signed)
" °   03/23/24 0400  Assess: MEWS Score  Temp 97.6 F (36.4 C)  BP 112/60  MAP (mmHg) 74  Pulse Rate (!) 110  ECG Heart Rate (!) 112  Resp 20  SpO2 100 %  O2 Device Nasal Cannula  O2 Flow Rate (L/min) 2 L/min  Assess: MEWS Score  MEWS Temp 0  MEWS Systolic 0  MEWS Pulse 2  MEWS RR 0  MEWS LOC 0  MEWS Score 2  MEWS Score Color Yellow  Assess: if the MEWS score is Yellow or Red  Were vital signs accurate and taken at a resting state? Yes  Does the patient meet 2 or more of the SIRS criteria? Yes  Does the patient have a confirmed or suspected source of infection? No  MEWS guidelines implemented  Yes, yellow  Treat  MEWS Interventions Considered administering scheduled or prn medications/treatments as ordered  Take Vital Signs  Increase Vital Sign Frequency  Yellow: Q2hr x1, continue Q4hrs until patient remains green for 12hrs  Escalate  MEWS: Escalate Yellow: Discuss with charge nurse and consider notifying provider and/or RRT  Notify: Charge Nurse/RN  Name of Charge Nurse/RN Notified Dawn,RN  Assess: SIRS CRITERIA  SIRS Temperature  0  SIRS Respirations  0  SIRS Pulse 1  SIRS WBC 0  SIRS Score Sum  1    "

## 2024-03-23 NOTE — Plan of Care (Signed)
  Problem: Clinical Measurements: Goal: Ability to maintain clinical measurements within normal limits will improve Outcome: Progressing Goal: Will remain free from infection Outcome: Progressing Goal: Respiratory complications will improve Outcome: Progressing   Problem: Activity: Goal: Risk for activity intolerance will decrease Outcome: Progressing   Problem: Coping: Goal: Level of anxiety will decrease Outcome: Progressing   Problem: Pain Managment: Goal: General experience of comfort will improve and/or be controlled Outcome: Progressing   Problem: Skin Integrity: Goal: Risk for impaired skin integrity will decrease Outcome: Progressing

## 2024-03-23 NOTE — Consult Note (Addendum)
 "  Cardiology Consultation   Patient ID: Deborah [name] MRN: [id number]; DOB: 06/18/1965  Admit date: 03/16/2024 Date of Consult: 03/23/2024  PCP:  Freddrick No   [facility] Providers Cardiologist:  Wilbert Bihari, MD        Patient Profile: Deborah [name] is a 59 y.o. female with a hx of paroxysmal atrial fibrillation, HFimpEF, chronically elevated troponin, HLD, HTN, history of stroke in 02/2011 with right-sided deficit, ESRD on HD (TTS), type 2 diabetes mellitus, PVD s/p right BKA, s/p recent I&D for left thigh abscess on 02/16/24 who is being seen 03/23/2024 for the evaluation of atrial fibrillation with RVR at the request of Dr. Lavada Stank.  History of Present Illness: [name] has medical history as stated above. Of note, patient was recently hospitalized from 02/14/2024 - 02/25/2024 for sepsis secondary to left thigh abscess at which time she underwent I&D with general surgery on 02/16/2024.  Patient was BIB [facility] from [facility] on 1/9 after 2 syncopal episodes witnessed by SNF staff and 1 episode witnessed by EMS. Per ED notes, patient does not have memory of any of these syncopal episodes and denies that they happened. She states that she got outpatient dialysis on 1/8 without any issues. Initially hemodynamically stable on arrival, although appeared very lethargic per ED triage. Later on in the ED, patient gradually became tachycardic and hemodynamically unstable despite fluid resuscitation of 3L LR and was subsequently started on vasopressors. Relevant ED lab work includes troponins 105 >> 95; VBG with pH 7.233, pCO2 35, bicarb 14.7; CBC with chronic leukocytosis and anemia; lactate 9; phosphorus 9; normal magnesium . Patient was admitted to ICU service for further evaluation and management of metabolic acidosis in the setting of shock requiring 2 vasopressors. Started on IV antibiotics empirically.   1/9: Started on levophed /vasopressin , A-line/CVC  placed 1/10: Nephrology consulted, started on CRRT via [facility]; escalated to IV meropenem  for ESBL Klebsiella bacteremia 1/12: Weaned off levophed , still on vasopressin  1/13: Weaned off vasopressin , pressors, and CRRT; still on midodrine  10 mg TID 1/14: Transferred out of ICU 1/15: Care assumed by [facility]; still on midodrine  10 mg TID, IV meropenem  for Klebsiella pneumoniae bacteremia (14-day course to end on 1/23) and PO vancomycin  prophylaxis (to end on 1/24)  Today, Cardiology is consulted for atrial fibrillation with RVR. ED EKGs 1/9 showed sinus tachycardia with significant artifact, LAFB, 111 bpm. EKGs 1/14 and 1/16 showed multifocal atrial tachycardia with LAFB, rate 120s-130s. Of note, telemetry demonstrates that patient entered atrial fibrillation with RVR in the 140s-150s around 12:15pm. Upon speaking to the patient, she is doing well with her mother at bedside. She states that her previous home medications for atrial fibrillation were stopped years ago but does not know when or why. EMR shows IP Cardiology note from [facility] on 04/03/21 at which time she was hospitalized for gastroparesis; she was on metoprolol  succinate 25 mg daily and Eliquis  5 mg BID then. She confirms she has not been anticoagulated for the past couple years. Denies any history of bleeding issues. States that she follows with [facility] although EMR does not reflect this and she cannot recall the name of her cardiology provider. She endorses palpitations and some dyspnea at this time but denies any chest pain. Also reports left lower extremity swelling and pain (s/p right BKA).   Past Medical History:  Diagnosis Date   Acute back pain with sciatica, left    Acute back pain with sciatica, right    Acute  encephalopathy 05/29/2022   Acute osteomyelitis of right calcaneus (HCC) 12/21/2022   Anemia, unspecified    Atrial fibrillation with RVR (HCC)-resolved 09/09/2022   Atypical chest pain 09/10/2021   Cancer [facility])     Carcinoid tumor of duodenum (HCC)    Chest pain with normal coronary angiography 2019   Chronic a-fib (HCC) 09/09/2022   Chronic pain    Chronic systolic CHF (congestive heart failure) (HCC)    Dehiscence of amputation stump of right lower extremity (HCC) 01/29/2023   Diabetes mellitus    DKA (diabetic ketoacidosis) (HCC)    Drug-seeking behavior    21 hospitalizations and 14 CT a/p in 2 years for N/V and abdominal pain, demanding only IV dilaudid    Elevated troponin    chronic   Esophageal reflux    Fibromyalgia    Gastric ulcer    Gastroparesis    Gout    HCAP (healthcare-associated pneumonia) 06/19/2022   Hyperlipidemia    Hyperosmolar hyperglycemic state (HHS) (HCC) 05/11/2022   Hyperosmolar non-ketotic state due to type 2 diabetes mellitus (HCC) 05/11/2022   Hypertension    Hypomagnesemia    Lumbosacral stenosis    LVH (left ventricular hypertrophy)    Morbid obesity (HCC)    Nausea & vomiting 09/09/2022   NICM (nonischemic cardiomyopathy) (HCC)    PAF (paroxysmal atrial fibrillation) (HCC)    Sepsis (HCC) 11/23/2022   Stroke (HCC) 02/2011   Symptomatic anemia 12/14/2022   Thrombocytosis    Vitamin B12 deficiency anemia     Past Surgical History:  Procedure Laterality Date   ABDOMINAL AORTOGRAM W/LOWER EXTREMITY N/A 11/29/2022   Procedure: ABDOMINAL AORTOGRAM W/LOWER EXTREMITY;  Surgeon: Pearline Norman RAMAN, MD;  [facility]VAS[facility];  Service: Cardiovascular;  Laterality: N/A;   AMPUTATION Right 12/29/2022   Procedure: RIGHT BELOW KNEE AMPUTATION;  Surgeon: Harden Jerona GAILS, MD;  Location: [facility] OR;  Service: Orthopedics;  Laterality: Right;   AV FISTULA PLACEMENT Left 06/30/2022   Procedure: LEFT BRACHIOCEPHALIC ARTERIOVENOUS (AV) FISTULA CREATION;  Surgeon: Gretta Lonni PARAS, MD;  Location: [facility] OR;  Service: Vascular;  Laterality: Left;   BIOPSY  07/27/2019   Procedure: BIOPSY;  Surgeon: Rosalie Kitchens, MD;  Location: [facility] ENDOSCOPY;  Service: Endoscopy;;   BIOPSY   07/30/2019   Procedure: BIOPSY;  Surgeon: Elicia Claw, MD;  Location: [facility] ENDOSCOPY;  Service: Gastroenterology;;   BIOPSY  04/28/2023   Procedure: BIOPSY;  Surgeon: Federico Rosario BROCKS, MD;  Location: [facility] ENDOSCOPY;  Service: Gastroenterology;;   CATARACT EXTRACTION  01/2014   CHOLECYSTECTOMY     COLONOSCOPY WITH PROPOFOL  N/A 07/30/2019   Procedure: COLONOSCOPY WITH PROPOFOL ;  Surgeon: Elicia Claw, MD;  Location: [facility] ENDOSCOPY;  Service: Gastroenterology;  Laterality: N/A;   ESOPHAGOGASTRODUODENOSCOPY N/A 07/27/2019   Procedure: ESOPHAGOGASTRODUODENOSCOPY (EGD);  Surgeon: Rosalie Kitchens, MD;  Location: [facility];  Service: Endoscopy;  Laterality: N/A;   ESOPHAGOGASTRODUODENOSCOPY N/A 07/26/2020   Procedure: ESOPHAGOGASTRODUODENOSCOPY (EGD);  Surgeon: Burnette Fallow, MD;  Location: [facility];  Service: Endoscopy;  Laterality: N/A;   ESOPHAGOGASTRODUODENOSCOPY (EGD) WITH PROPOFOL  N/A 08/02/2019   Procedure: ESOPHAGOGASTRODUODENOSCOPY (EGD) WITH PROPOFOL ;  Surgeon: Elicia Claw, MD;  Location: [facility] ENDOSCOPY;  Service: Gastroenterology;  Laterality: N/A;   ESOPHAGOGASTRODUODENOSCOPY (EGD) WITH PROPOFOL  N/A 12/23/2022   Procedure: ESOPHAGOGASTRODUODENOSCOPY (EGD) WITH PROPOFOL ;  Surgeon: San Sandor GAILS, DO;  Location: [facility] ENDOSCOPY;  Service: Gastroenterology;  Laterality: N/A;   ESOPHAGOGASTRODUODENOSCOPY (EGD) WITH PROPOFOL  N/A 04/28/2023   Procedure: ESOPHAGOGASTRODUODENOSCOPY (EGD) WITH PROPOFOL ;  Surgeon: Federico Rosario BROCKS, MD;  Location: [facility] ENDOSCOPY;  Service: Gastroenterology;  Laterality: N/A;   FISTULA SUPERFICIALIZATION Left 12/31/2022   Procedure: LEFT ARM FISTULA TRANSPOSITION;  Surgeon: Sheree Penne Bruckner, MD;  Location: [facility] OR;  Service: Vascular;  Laterality: Left;   GIVENS CAPSULE STUDY N/A 12/23/2022   Procedure: GIVENS CAPSULE STUDY;  Surgeon: San Sandor GAILS, DO;  Location: [facility] ENDOSCOPY;  Service: Gastroenterology;  Laterality: N/A;   HEMOSTASIS CLIP PLACEMENT   08/02/2019   Procedure: HEMOSTASIS CLIP PLACEMENT;  Surgeon: Elicia Claw, MD;  Location: [facility] ENDOS[facility];  Service: Gastroenterology;;   HOT HEMOSTASIS N/A 12/23/2022   Procedure: HOT HEMOSTASIS (ARGON PLASMA COAGULATION/BICAP);  Surgeon: San Sandor GAILS, DO;  Location: [facility] ENDOSCOPY;  Service: Gastroenterology;  Laterality: N/A;   INCISION AND DRAINAGE ABSCESS Left 02/16/2024   Procedure: INCISION AND DRAINAGE, ABSCESS;  Surgeon: Vernetta Berg, MD;  Location: [facility] OR;  Service: General;  Laterality: Left;  LEFT THIGH ABSCESS   IR FLUORO GUIDE CV LINE RIGHT  06/24/2022   IR US  GUIDE VASC ACCESS RIGHT  06/24/2022   POLYPECTOMY  07/30/2019   Procedure: POLYPECTOMY;  Surgeon: Elicia Claw, MD;  Location: [facility] ENDOSCOPY;  Service: Gastroenterology;;   POLYPECTOMY  08/02/2019   Procedure: POLYPECTOMY;  Surgeon: Elicia Claw, MD;  Location: [facility];  Service: Gastroenterology;;   HARLEY DILATION N/A 04/28/2023   Procedure: HARLEY DILATION;  Surgeon: Federico Rosario BROCKS, MD;  Location: [facility] ENDOSCOPY;  Service: Gastroenterology;  Laterality: N/A;   STUMP REVISION Right 02/02/2023   Procedure: REVISION RIGHT BELOW KNEE AMPUTATION;  Surgeon: Harden Jerona GAILS, MD;  Location: [facility] OR;  Service: Orthopedics;  Laterality: Right;       Scheduled Meds:  ascorbic acid   500 mg Per Tube BID   atorvastatin   10 mg Per Tube Daily   busPIRone   5 mg Per Tube TID   collagenase    Topical Daily   darbepoetin (ARANESP ) injection - DIALYSIS  150 mcg Subcutaneous Q Tue-1800   diltiazem   30 mg Oral Q12H   famotidine   20 mg Per Tube QHS   [START ON 03/24/2024] feeding supplement  1 Container Oral Q24H   insulin  aspart  0-15 Units Subcutaneous Q4H   insulin  glargine  5 Units Subcutaneous QHS   midodrine   10 mg Per Tube Q8H   multivitamin  1 tablet Oral QHS   mouth rinse  15 mL Mouth Rinse 4 times per day   risperiDONE   1 mg Oral QHS   sodium chloride  flush  10-40 mL Intracatheter Q12H   thiamine   100 mg Per  Tube Daily   vancomycin   125 mg Per Tube Daily   Or   vancomycin   125 mg Oral Daily   Continuous Infusions:  amiodarone      Followed by   amiodarone      anticoagulant sodium citrate      meropenem  (MERREM ) IV Stopped (03/22/24 2200)   PRN Meds: acetaminophen , alteplase , anticoagulant sodium citrate , diltiazem , HYDROmorphone  (DILAUDID ) injection, HYDROmorphone , hydrOXYzine , lidocaine  (PF), lidocaine -prilocaine , mouth rinse, oxyCODONE  **OR** [DISCONTINUED] oxyCODONE , pentafluoroprop-tetrafluoroeth, polyethylene glycol, senna  Allergies:   Allergies[1]  Social History:   Social History   Socioeconomic History   Marital status: Married    Spouse name: Not on file   Number of children: Not on file   Years of education: Not on file   Highest education level: Not on file  Occupational History   Not on file  Tobacco Use   Smoking status: Never   Smokeless tobacco: Never  Vaping Use   Vaping status: Never Used  Substance and Sexual Activity  Alcohol use: No   Drug use: No   Sexual activity: Not Currently    Birth control/protection: None  Other Topics Concern   Not on file  Social History Narrative   ** Merged History Encounter **       Social Drivers of Health   Tobacco Use: Low Risk (03/18/2024)   Patient History    Smoking Tobacco Use: Never    Smokeless Tobacco Use: Never    Passive Exposure: Not on file  Financial Resource Strain: Low Risk (03/23/2021)   Received from [facility]   Financial Resource Strain  Food Insecurity: No Food Insecurity (03/18/2024)   Epic    Worried About Programme Researcher, Broadcasting/film/video in the Last Year: Never true    Ran Out of Food in the Last Year: Never true  Transportation Needs: No Transportation Needs (03/18/2024)   Epic    Lack of Transportation (Medical): No    Lack of Transportation (Non-Medical): No  Physical Activity: Not on file  Stress: Low Risk (03/23/2021)   Received from [name])   Stress     Over the last 2 weeks, how often have you been bothered by the following problems: feeling nervous, anxious, on edge?: Not at all    Over the last 2 weeks, how often have you been bothered by the following problems: Not being able to stop or control worrying?: Not at all  Social Connections: Low Risk (03/23/2021)   Received [facility])   Social Connections    How often do you feel that you lack companionship?: Hardly ever    How often do you feel left out?: Hardly Ever    How often do you feel isolated from others?: Hardly ever  Intimate Partner Violence: Not At Risk (03/18/2024)   Epic    Fear of Current or Ex-Partner: No    Emotionally Abused: No    Physically Abused: No    Sexually Abused: No  Depression (PHQ2-9): Medium Risk (03/16/2023)   Depression (PHQ2-9)    PHQ-2 Score: 6  Alcohol Screen: Not on file  Housing: Low Risk (03/18/2024)   Epic    Unable to Pay for Housing in the Last Year: No    Number of Times Moved in the Last Year: 0    Homeless in the Last Year: No  Utilities: Not At Risk (03/18/2024)   Epic    Threatened with loss of utilities: No  Recent Concern: Utilities - At Risk (02/18/2024)   Epic    Threatened with loss of utilities: Already shut off  Health Literacy: Not on file    Family History:    Family History  Problem Relation Age of Onset   Diabetes Mother    Diabetes Father    Heart disease Father    Diabetes Sister    Congestive Heart Failure Sister 8   Diabetes Brother      ROS:  Please see the history of present illness.   All other ROS reviewed and negative.     Physical Exam/Data: Vitals:   03/23/24 1300 03/23/24 1330 03/23/24 1400 03/23/24 1630  BP: 109/83 (!) 86/62 (!) 85/66 101/66  Pulse: (!) 145 (!) 148 (!) 153 (!) 132  Resp: 18 (!) 24 16 (!) 23  Temp:    97.8 F (36.6 C)  TempSrc:    Oral  SpO2: 100% 99% 100% 99%  Weight:        Intake/Output Summary (Last 24 hours) at 03/23/2024  1645 Last data filed  at 03/23/2024 1200 Gross per 24 hour  Intake 403 ml  Output --  Net 403 ml      03/23/2024    5:00 AM 03/22/2024    5:00 AM 03/21/2024    5:00 AM  Last 3 Weights  Weight (lbs) 228 lb 6.3 oz 229 lb 15 oz 221 lb 1.9 oz  Weight (kg) 103.6 kg 104.3 kg 100.3 kg     Body mass index is 36.86 kg/m.  General: Chronically-ill appearing, resting in bed in no acute distress, on 2 L/min O2 via [place]: No JVD Vascular: Distal pulses 2+ Cardiac: Normal S1, S2; irregularly irregular rhythm, tachycardic; no murmur Lungs: Clear to auscultation bilaterally, no wheezing, rhonchi or rales  Abd: Soft, nontender, no hepatomegaly  Ext: Right BKA, 2+ pitting edema in LLE with chronic hyperpigmentation and thickening of the skin Skin: Warm and dry  Neuro: No focal abnormalities noted Psych: Normal affect   EKG: The EKGs were personally reviewed and demonstrates: 1/9 sinus tachycardia with frequent PACs, rate 111 bpm. 1/14 and 1/16 MAT with frequent PACs, LAFB, rates in the 120s-130s Telemetry: Telemetry was personally reviewed and demonstrates: Atrial fibrillation with RVR in the 140s-150s from approximately 12:15pm to 3pm today. At this time, shows sinus tachycardia in the 140s with very frequent PACs  Relevant CV Studies:  Limited echo [03/18/24]: Left ventricular ejection fraction, by estimation, is 60 to 65% . The left ventricle has normal function. There is mild concentric left ventricular hypertrophy. Left ventricular diastolic parameters were normal.  Basal and apical function preserved but unable to assess free wall function due to poor visualization. Right ventricular systolic function was not well visualized. The right ventricular size is not well visualized.  There is a small pericardial effusion located posteriorly ( # 21) . a small pericardial effusion is present. The pericardial effusion is posterior to the left ventricle.  The mitral valve is normal in structure. No evidence of mitral valve  regurgitation. No evidence of mitral stenosis.  Calcified nodule in left coronary cusp. The aortic valve is abnormal. Aortic valve regurgitation is not visualized. No aortic stenosis is present.  The inferior vena cava is dilated in size with < 50% respiratory variability, suggesting right atrial pressure of 15 mmHg.  CXR [03/22/24]: No acute findings, low lung volumes  Nuclear stress test [01/29/19]: There was no ST segment deviation noted during stress. No T wave inversion was noted during stress. Defect 1: There is a medium defect of moderate severity present in the basal anterolateral, mid anterior, mid anterolateral and apical anterior location. This appears to be most c/w breast / chest wall artifact. This is an intermediate risk study. Nuclear stress EF: 36%. The left ventricular ejection fraction is moderately decreased (30-44%).   Laboratory Data: High Sensitivity Troponin:  No results for input(s): TROPONINIHS in the last 720 hours.  Recent Labs  Lab 03/16/24 1711 03/16/24 1949  TRNPT 105* 95*      Chemistry Recent Labs  Lab 03/19/24 0347 03/19/24 1643 03/20/24 0507 03/20/24 1702 03/21/24 0524  NA 136   < > 136 137 137  K 3.7   < > 3.7 3.6 3.5  CL 99   < > 103 104 105  CO2 23   < > 27 24 25   GLUCOSE 138*   < > 182* 269* 208*  BUN 11   < > 10 13 19   CREATININE 2.10*   < > 1.32* 1.45* 1.84*  CALCIUM  7.4*   < >  7.6* 7.4* 7.6*  MG 2.1  --  2.3  --  2.3  GFRNONAA 27*   < > 46* 41* 31*  ANIONGAP 15   < > 7 9 8    < > = values in this interval not displayed.    Recent Labs  Lab 03/16/24 1711 03/17/24 0828 03/18/24 1639 03/19/24 0347 03/19/24 1643 03/20/24 0507 03/20/24 1702 03/21/24 0524  PROT 5.0*  --   --  4.4*  --   --   --   --   ALBUMIN  1.9*   < >  --  1.9*  1.9*   < > 2.0* 2.2* 2.1*  AST 35  --   --  15  --   --   --   --   ALT <5  --   --  <5  --   --   --   --   ALKPHOS 93  --   --  75  --   --   --   --   BILITOT 0.4  --  0.7 0.7  --   --   --    --    < > = values in this interval not displayed.   Lipids No results for input(s): CHOL, TRIG, HDL, LABVLDL, LDLCALC, CHOLHDL in the last 168 hours.  Hematology Recent Labs  Lab 03/19/24 0347 03/20/24 0507 03/21/24 0524  WBC 14.1* 9.4 8.3  RBC 2.47* 2.43* 2.56*  HGB 7.6* 7.6* 8.0*  HCT 23.5* 22.8* 24.7*  MCV 95.1 93.8 96.5  MCH 30.8 31.3 31.3  MCHC 32.3 33.3 32.4  RDW 21.0* 20.8* 20.3*  PLT 82* 66* 82*   Thyroid  No results for input(s): TSH, FREET4 in the last 168 hours.  BNPNo results for input(s): BNP, PROBNP in the last 168 hours.  DDimer  Recent Labs  Lab 03/17/24 1835 03/18/24 1639  DDIMER 0.63* 1.05*    Radiology/Studies:  DG Chest Port 1 View Result Date: 03/22/2024 EXAM: 1 VIEW(S) XRAY OF THE CHEST 03/22/2024 12:07:00 PM COMPARISON: 03/16/2024 CLINICAL HISTORY: SOB (shortness of breath) FINDINGS: LINES, TUBES AND DEVICES: Right chest wall dual lumen central venous catheter in place with tip terminating over the superior right atrium. Enteric tube in place coursing through the chest to the abdomen beyond the field-of-view. LUNGS AND PLEURA: Low lung volumes. No focal pulmonary opacity. No pleural effusion. No pneumothorax. HEART AND MEDIASTINUM: No acute abnormality of the cardiac and mediastinal silhouettes. BONES AND SOFT TISSUES: Degenerative changes of bilateral shoulders. Bilateral high-riding humeral heads with significant decrease in acromiohumeral interval. No acute osseous abnormality. IMPRESSION: 1. No acute findings. 2. Low lung volumes. 3. Advanced arthropathic changes involving both shoulders with suspected bilateral rotator cuff pathology resulting in high-riding humeral heads. Electronically signed by: Waddell Calk MD 03/22/2024 03:47 PM EST RP Workstation: [id number]    Assessment and Plan: Paroxysmal atrial fibrillation 1/9 EKG x2 showed sinus tachycardia with LAFB, 1/14 and 1/16 EKGs showed ?MAT/sinus tachycardia with frequent PACs  with LAFB Telemetry shows atrial fibrillation with RVR in the 140s-150s as of approximately 12:15pm today, converted to sinus tach by around 3pm Given PO diltiazem  30 mg x 1 dose and IV diltiazem  10 mg x 1 dose this morning. Will discontinue diltiazem  and start IV amiodarone  Has not been anticoagulated for years per pt, unclear as to when or why anticoagulation was stopped. Most recent documentation of anticoagulation was from [facility] during hospitalization on 04/03/21 at which time she was on Eliquis  5 mg BID. Given  that patient had only brief episode of atrial fibrillation in the setting of acute illness/recent recovery from septic shock (and has otherwise been in sinus tachycardia/frequent PACs), will later consider anticoagulation if patient shows recurrent atrial fibrillation  Septic shock secondary to Klebsiella pneumoniae bacteremia Acute metabolic encephalopathy due to sepsis Off Levophed  1/12, off vasopressin  1/13 Still on midodrine  10 mg TID (home med for patient on non-HD days) On IV meropenem  (to end 1/23) and PO vancomycin  (to end 1/24) Ongoing management per primary  ESRD on HD TTS Previously on CRRT 1/10-1/13, RIJ TDC still in place Pt refused HD 1/15. Per nephrology, plan for HD tomorrow if potassium is okay today (labs pending) Ongoing management per primary/nephrology  HFimpEF Limited echo this admission showed LVEF 60-65%, mild LVH, small pericardial effusion posterior to LV, dilated IVC No home GDMT Will defer GDMT at this time given normal EF, also limited by recovering from septic shock requiring pressors x2 and ongoing hypotension supported by midodrine   CAD/hyperlipidemia Troponin chronically elevated dating back years Chest pain-free, no other ischemic symptoms at this time Intolerance to ASA due to history of PUD Continue home statin  Per primary Anemia of chronic disease History of Cdiff Chronic gastroparesis Type 2 diabetes mellitus History  of stroke PVD s/p right BKA S/p recent I&D for left thigh abscess on 02/16/24 Chronic pain Sacral pressure injury  Risk Assessment/Risk Scores:      [place]  Heart Association (NYHA) Functional Class NYHA Class IV  CHA2DS2-VASc Score = 7   This indicates a 11.2% annual risk of stroke. The patient's score is based upon: CHF History: 1 HTN History: 1 Diabetes History: 1 Stroke History: 2 Vascular Disease History: 1 Age Score: 0 Gender Score: 1     For questions or updates, please contact [facility] Please consult [email or web address] for contact info under      Signed, Owen MARLA Daniels, PA-C  03/23/2024 4:45 PM   I have seen and examined the patient along with Hanh K Le, PA-C .  I have reviewed the chart, notes and new data.  I agree with PA/NP's note.  Key new complaints: She is aware the palpitations, but they do not seem to bother her too much.  She does not have shortness of breath associated with the change in rhythm.  She complains of hurting all over including in her chest where she is tender to palpation.  There is no discomfort around the tunneled dialysis catheter. Key examination changes: Tachycardia with regular baseline rhythm with very frequent ectopic beats and brief bursts of tachycardia.  Lungs are clean.  Dialysis catheter site looks healthy.  I cannot identify a bruit or thrill over the AV fistula. Key new findings / data: Labs consistent with ESRD.  Telemetry shows a period of 3 hours where she probably had true atrial fibrillation this afternoon, but mostly her rhythm has been sinus tachycardia with frequent PACs and brief bursts of atrial tachycardia.    PLAN: There are few options for arrhythmia management due to hypotension.  She is still receiving midodrine .  Will start intravenous amiodarone .  At this point, it is still possible that her arrhythmia is primarily related to the Klebsiella sepsis  from which she is recovering.  High risk of bleeding  complications with ESRD.  Would monitor and not commit to long-term anticoagulation just yet.  Her Eliquis  was previously discontinued for reasons that are unclear.  Will require some type of long-term monitoring.  Garlen Reinig, MD, FACC [facility] (  [phone] 03/23/2024, 6:00 PM     [1]  Allergies Allergen Reactions   Asa [Aspirin ] Other (See Comments)    Hx of stomach ulcer   Bentyl  [Dicyclomine ] Other (See Comments)    Chest pain   Gabapentin  Hives and [name]   Isovue  [Iopamidol ] Anaphylaxis, [name] and Other (See Comments)    11/28/17 Patient had seizure like activity and then 1 min code after 100 cc of isovue  300. Possible contrast allergy  vs vasovagal episode  Cardiac Arrest   Nsaids Anaphylaxis and Other (See Comments)    Hx of stomach ulcers   Penicillins Itching, Palpitations and Other (See Comments)    Flushing (Red Skin) Laryngeal Edema   Reglan  [Metoclopramide ] Other (See Comments)    Tardive dyskinesia    Valium  [Diazepam ] [name]   Zestril [Lisinopril] Anaphylaxis and Swelling    Tongue and mouth swelling Laryngeal Edema   Tolectin [Tolmetin] Nausea And Vomiting, Nausea Only and Other (See Comments)    Irritates stomach ulcer   Aspartame And Phenylalanine Hives   Flexeril  [Cyclobenzaprine ] Palpitations   Hibiclens  [Chlorhexidine  Gluconate] Dermatitis   Oxycontin  [Oxycodone ] Palpitations   Rifamycins Palpitations   Tylenol  [Acetaminophen ] Nausea And Vomiting, Nausea Only and Other (See Comments)    Irritates stomach ulcer Abdominal pain   Ultram  [Tramadol ] Nausea And Vomiting and Palpitations   "

## 2024-03-23 NOTE — Progress Notes (Signed)
 " [facility] Progress Note   Subjective:    Seen and examined patient at bedside. She informed me she refused yesterday's HD d/t generalized pain. Will have the HD RN obtain labs from the [facility]. If labs are stable, will resume HD tomorrow. If labs are abnormal, will plan for HD sometime today. HR currently running in 120s-she feels fine.  Objective Vitals:   03/23/24 0400 03/23/24 0500 03/23/24 0640 03/23/24 0759  BP: 112/60  (!) 114/56 110/69  Pulse: (!) 110  (!) 115 98  Resp: 20  20 16   Temp: 97.6 F (36.4 C)  97.8 F (36.6 C) 97.9 F (36.6 C)  TempSrc: Oral  Oral Oral  SpO2: 100%  100% 100%  Weight:  103.6 kg     Physical Exam Gen: ill appearing CVS: RRR Resp: normal wob/unlabored Abd: obese, soft Ext: right BKA, trace dependent edema Neuro: drowsy Dialysis access: RIJ [facility]  Filed Weights   03/21/24 0500 03/22/24 0500 03/23/24 0500  Weight: 100.3 kg 104.3 kg 103.6 kg    Intake/Output Summary (Last 24 hours) at 03/23/2024 1105 Last data filed at 03/22/2024 2110 Gross per 24 hour  Intake 243 ml  Output --  Net 243 ml    Additional Objective Labs: Basic Metabolic Panel: Recent Labs  Lab 03/20/24 0507 03/20/24 1702 03/21/24 0524  NA 136 137 137  K 3.7 3.6 3.5  CL 103 104 105  CO2 27 24 25   GLUCOSE 182* 269* 208*  BUN 10 13 19   CREATININE 1.32* 1.45* 1.84*  CALCIUM  7.6* 7.4* 7.6*  PHOS 1.8* 2.4* 2.3*   Liver Function Tests: Recent Labs  Lab 03/16/24 1711 03/17/24 0828 03/18/24 1639 03/19/24 0347 03/19/24 1643 03/20/24 0507 03/20/24 1702 03/21/24 0524  AST 35  --   --  15  --   --   --   --   ALT <5  --   --  <5  --   --   --   --   ALKPHOS 93  --   --  75  --   --   --   --   BILITOT 0.4  --  0.7 0.7  --   --   --   --   PROT 5.0*  --   --  4.4*  --   --   --   --   ALBUMIN  1.9*   < >  --  1.9*  1.9*   < > 2.0* 2.2* 2.1*   < > = values in this interval not displayed.   No results for input(s): LIPASE, AMYLASE in the last 168  hours. CBC: Recent Labs  Lab 03/16/24 1711 03/16/24 1754 03/18/24 0447 03/18/24 0649 03/18/24 1535 03/18/24 1639 03/19/24 0347 03/20/24 0507 03/21/24 0524  WBC 11.6*   < > 12.6*  --  13.1*  --  14.1* 9.4 8.3  NEUTROABS 10.1*  --   --   --   --   --   --   --   --   HGB 7.6*   < > 7.9*   < > 6.6*   < > 7.6* 7.6* 8.0*  HCT 23.3*   < > 23.6*   < > 20.5*   < > 23.5* 22.8* 24.7*  MCV 96.3   < > 94.0  --  99.0  --  95.1 93.8 96.5  PLT 145*   < > 130*  --  93*   < > 82* 66* 82*   < > =  values in this interval not displayed.   Blood Culture    Component Value Date/Time   SDES BLOOD RIGHT ANTECUBITAL 03/16/2024 1949   SPECREQUEST  03/16/2024 1949    BOTTLES DRAWN AEROBIC AND ANAEROBIC Blood Culture results may not be optimal due to an inadequate volume of blood received in culture bottles   CULT (A) 03/16/2024 1949    KLEBSIELLA PNEUMONIAE Confirmed Extended Spectrum Beta-Lactamase Producer (ESBL).  In bloodstream infections from ESBL organisms, carbapenems are preferred over piperacillin /tazobactam. They are shown to have a lower risk of mortality.    REPTSTATUS 03/19/2024 FINAL 03/16/2024 1949    Cardiac Enzymes: No results for input(s): CKTOTAL, CKMB, CKMBINDEX, TROPONINI in the last 168 hours. CBG: Recent Labs  Lab 03/22/24 1648 03/22/24 2015 03/22/24 2358 03/23/24 0435 03/23/24 0824  GLUCAP 76 135* 131* 123* 82   Iron  Studies: No results for input(s): IRON , TIBC, TRANSFERRIN, FERRITIN in the last 72 hours. Lab Results  Component Value Date   INR 1.8 (H) 03/18/2024   INR 1.9 (H) 03/17/2024   INR 1.5 (H) 03/22/2023   Studies/Results: DG Chest Port 1 View Result Date: 03/22/2024 EXAM: 1 VIEW(S) XRAY OF THE CHEST 03/22/2024 12:07:00 PM COMPARISON: 03/16/2024 CLINICAL HISTORY: SOB (shortness of breath) FINDINGS: LINES, TUBES AND DEVICES: Right chest wall dual lumen central venous catheter in place with tip terminating over the superior right atrium.  Enteric tube in place coursing through the chest to the abdomen beyond the field-of-view. LUNGS AND PLEURA: Low lung volumes. No focal pulmonary opacity. No pleural effusion. No pneumothorax. HEART AND MEDIASTINUM: No acute abnormality of the cardiac and mediastinal silhouettes. BONES AND SOFT TISSUES: Degenerative changes of bilateral shoulders. Bilateral high-riding humeral heads with significant decrease in acromiohumeral interval. No acute osseous abnormality. IMPRESSION: 1. No acute findings. 2. Low lung volumes. 3. Advanced arthropathic changes involving both shoulders with suspected bilateral rotator cuff pathology resulting in high-riding humeral heads. Electronically signed by: Taylor Stroud MD 03/22/2024 03:47 PM EST RP Workstation: [id number]    Medications:  anticoagulant sodium citrate      feeding supplement (VITAL 1.5 CAL) Stopped (03/21/24 1630)   meropenem  (MERREM ) IV Stopped (03/22/24 2200)    ascorbic acid   500 mg Per Tube BID   atorvastatin   10 mg Per Tube Daily   busPIRone   5 mg Per Tube TID   collagenase    Topical Daily   darbepoetin (ARANESP ) injection - DIALYSIS  150 mcg Subcutaneous Q Tue-1800   diltiazem   30 mg Oral Q12H   famotidine   20 mg Per Tube QHS   insulin  aspart  0-15 Units Subcutaneous Q4H   insulin  glargine  5 Units Subcutaneous QHS   midodrine   10 mg Per Tube Q8H   multivitamin  1 tablet Per Tube QHS   mouth rinse  15 mL Mouth Rinse 4 times per day   risperiDONE   1 mg Oral QHS   sodium chloride  flush  10-40 mL Intracatheter Q12H   thiamine   100 mg Per Tube Daily   vancomycin   125 mg Per Tube Daily   Or   vancomycin   125 mg Oral Daily    Dialysis Orders: [facility]  B400   96.1kg  TDC   Hep none Last OP HD 1/08, post wt 100kg  Assessment/Plan: ESRD - on HD TTS OP -s/p CRRT 1/10-1/13 given significant shock - refused HD yesterday, will have the HD RN obtain labs from [facility] today. If labs are stable, will do HD tomorrow. If labs are abnormal, will  do  HD today.  Sinus Tachycardia -HR running 120s now-asymptomatic   shock/ hypotension - s/p pressors - getting po midodrine  10 tid - GNR bacteremia likely the cause   Sepsis -secondary to klebsiella pn bacteremia -on meropenem  and vanc -also on po vanc -per primary service -repeat bcx--if not cleared then will need to attempt line holiday (has RIJ TDC)   Volume - UF as tolerated with midodrine  support   Anemia of esrd - follow, transfuse prn  -avoid IV Fe for now, started aranesp  1/13   Diarrhea - getting empiric po vanc - had diarrhea and concern for Cdif, neg testing 1/10   Wounds -mgmt per primary service  [name], NP [facility] 03/23/2024,11:05 AM  LOS: 7 days    "

## 2024-03-23 NOTE — Progress Notes (Addendum)
 "                                                                                                                                                                                                                                                                                PROGRESS NOTE     Patient Demographics:    Deborah [name], is a 59 y.o. female, DOB - [birth year], FMW:[id number]  Outpatient Primary MD for the patient is Pcp, No    LOS - 7  Admit date - 03/16/2024    Chief Complaint  Patient presents with   Loss of Consciousness       Brief Narrative (HPI from H&P)   59 yo female with past medical history significant for PAF, HLD, right BKA, ESRD on HD (TTS) who presented from SNF to [facility] after syncopal episode with +LOC. Patient hemodynamically unstable in ED despite fluid resuscitation with 3L crystalloid and was subsequently started on vasopressors. Labs notable for metabolic acidosis, sCr 7.04, BUN 23, iCa 1.00, troponin 105, lactic acic 9.0, given 1 amp HCO3. On exam patient with diffuse abdominal pain, exam somewhat limited as she has chronic pain managed daily with Dilaudid . Patient started empirically on Cefepime , Vanc, Flagyl ; CT A/P pending, and syncope workup in progress. PCCM consulted for ICU admission.   Of note, patient recently hospitalized 02/14/2024-02/25/2024 for sepsis secondary to left thigh abscess and underwent I&D with gen surg 02/16/2024.  She was diagnosed with sepsis and subsequently Klebsiella bacteremia she was placed on IV meropenem , due to low blood pressures and underlying ESRD she was seen by nephrology and underwent multiple sessions of CRRT in ICU, she was also placed on vancomycin  prophylactically to prevent reoccurrence of C. difficile.  She was somewhat stabilized and transferred to my care on 03/22/2024 on day 6 of her hospital stay, currently blood pressure in 80s, still has NG tube, minimally responsive with generalized pain including abdominal  pain.   Significant Hospital Events: Including procedures, antibiotic start and stop dates in addition to other pertinent events   1/9 ED s/p witnessed syncopal event w/ +LOC in shock, started on Levo/Vaso; Aline/CVC placed 1/10.  CT abdomen pelvis in the ER.  Nonacute.  1/10 CRRT, still on vaso, NE. Esclated to meropenem  for ESBL Klebsiella 1/11 echocardiogram EF 60%.  No acute changes 1/12: off levophed , on vasopressin  1/13: off vasopressin , steroids, and CRRT 1/14: transfer out of ICU, TRH to pick up 1/15    Subjective:   Patient in bed, appears comfortable, denies any headache, no fever, no chest pain or pressure, no shortness of breath , no abdominal pain. No focal weakness.  Eating breakfast and feels better today.   Assessment  & Plan :    Septic shock due to Klebsiella pneumoniae bacteremia, Acute metabolic encephalopathy due to sepsis, improved, Chronic Hypotension   -Nephrology okay with keeping RIJ for now, CT abdomen pelvis negative, follow chest x-ray, on IV meropenem  with stop date of 03/31/2024, on prophylactic oral vancomycin  due to previous history of C. difficile with the same stop date.  Initially required Levophed  and stress dose steroids, sepsis pathophysiology has resolved.  Still on midodrine  but takes that at home as well.  Continue to monitor, so far source of infection likely wounds according to the ICU team.  Will check repeat blood cultures on 03/22/2024 and monitor.    ESRD on HD (TTS) - nephrology on board initially required CRRT in ICU, defer HD to nephrology.  Chronic hypotension-takes midodrine  baseline continue, minimize narcotic intake.  Patient counseled.   Anemia of chronic disease  -drop in Hb may be dilutional. No evidence of DIC or bleeding, monitor   History of Cdiff  - cdiff negative,  on prophylactic dose PO vanc   Syncope; suspect due to sepsis  -echo: EF 60-605%, mild LVH, small pericardial effusion posteriorly    Troponin elevation;  likely demand d/t shock state, PAF-not on anticoagulation due to past history of GI bleed, troponin elevation due to demand mismatch from sepsis, trend is flat and in non-ACS pattern, echo shows preserved EF and no wall motion abnormality.  Chest pain-free no further workup.  If blood pressure allows will add Cardizem  p.o. and as needed for rate control. If RVR continues may need Amio gtt.   HLD  - PTA statin   Chronic pain, Hx fibromyalgia, Acute on chronic abdominal pain, continue supportive care counseled to minimize narcotic intake as blood pressure at baseline is very low.  Hx duodenal carcinoid tumor s/p resection (2022), Hx gastric ulcer & gastritis (2024) - Chronic gastroparesis -con't H2-blocker; trying to avoid PPI due to C diff risk on broad spectrum antibiotics, monitor.     S/p I&D for left thigh abscess 02/16/24.  Requested Dr. Harden to reevaluate on 03/22/2024.   Drug-seeking behavior, Opioid-use disorder, Hx substance abuse-- as an OP following with [facility]   Wounds present on admission-- left thigh, R hip, lower abdomen, buttocks, L heel. turns, WOC as needed Dr. Harden to evaluate left thigh postop site.  PVD s/p R BKA- stump is well healed   Sacral pressure injury - turns, unfortunately needs pressors, hard to keep that skin clear with frequent diarrhea   Possible vaginal yeast infection  -Clotrimazole  cream daily x7 days   Anxiety/Depression -Resume home buspar , risperidone , trazodone  when appropriate> waiting for mental status to normalize some   DM2; hypoglycemia from sepsis corrected with steroids -SSI PRN -goal BG 140-180  Lab Results  Component Value Date   HGBA1C 4.3 (L) 02/15/2024   CBG (last 3)  Recent Labs    03/22/24 2358 03/23/24 0435 03/23/24 0824  GLUCAP 131* 123* 82        Condition - Extremely Guarded  Family Communication  : None present  Code Status : Full code  Consults  : PCCM, nephrology, Dr. Harden  PUD Prophylaxis :     Procedures  :           Disposition Plan  :    Status is: Inpatient   DVT Prophylaxis  :    Place and maintain sequential compression device Start: 03/20/24 1800  Lab Results  Component Value Date   PLT 82 (L) 03/21/2024    Diet :  Diet Order             Diet regular Room service appropriate? Yes; Fluid consistency: Thin; Fluid restriction: 1800 mL Fluid  Diet effective now                    Inpatient Medications  Scheduled Meds:  ascorbic acid   500 mg Per Tube BID   atorvastatin   10 mg Per Tube Daily   busPIRone   5 mg Per Tube TID   collagenase    Topical Daily   darbepoetin (ARANESP ) injection - DIALYSIS  150 mcg Subcutaneous Q Tue-1800   diltiazem   30 mg Oral Q12H   famotidine   20 mg Per Tube QHS   insulin  aspart  0-15 Units Subcutaneous Q4H   insulin  glargine  5 Units Subcutaneous QHS   midodrine   10 mg Per Tube Q8H   multivitamin  1 tablet Per Tube QHS   mouth rinse  15 mL Mouth Rinse 4 times per day   risperiDONE   1 mg Oral QHS   sodium chloride  flush  10-40 mL Intracatheter Q12H   thiamine   100 mg Per Tube Daily   vancomycin   125 mg Per Tube Daily   Or   vancomycin   125 mg Oral Daily   Continuous Infusions:  feeding supplement (VITAL 1.5 CAL) Stopped (03/21/24 1630)   meropenem  (MERREM ) IV Stopped (03/22/24 2200)   PRN Meds:.acetaminophen , diltiazem , HYDROmorphone  (DILAUDID ) injection, HYDROmorphone , hydrOXYzine , mouth rinse, oxyCODONE  **OR** [DISCONTINUED] oxyCODONE , polyethylene glycol, senna    Objective:   Vitals:   03/23/24 0400 03/23/24 0500 03/23/24 0640 03/23/24 0759  BP: 112/60  (!) 114/56 110/69  Pulse: (!) 110  (!) 115 98  Resp: 20  20 16   Temp: 97.6 F (36.4 C)  97.8 F (36.6 C) 97.9 F (36.6 C)  TempSrc: Oral  Oral Oral  SpO2: 100%  100% 100%  Weight:  103.6 kg      Wt Readings from Last 3 Encounters:  03/23/24 103.6 kg  02/25/24 97.2 kg  06/20/23 114.5 kg     Intake/Output Summary (Last 24 hours) at 03/23/2024  1032 Last data filed at 03/22/2024 2110 Gross per 24 hour  Intake 243 ml  Output --  Net 243 ml     Physical Exam  Awake Alert, No new F.N deficits, right IJ HD catheter in place, NG tube in place [place].AT,PERRAL Supple Neck, No JVD,   Symmetrical Chest wall movement, Good air movement bilaterally, CTAB RRR,No Gallops,Rubs or new Murmurs,  +ve B.Sounds, Abd Soft, No tenderness,   No Cyanosis, Clubbing or edema     RN pressure injury documentation: Wound 02/19/24 0200 Pressure Injury Foot Left;Lateral Unstageable - Full thickness tissue loss in which the base of the injury is covered by slough (yellow, tan, gray, green or brown) and/or eschar (tan, brown or black) in the wound bed. (Active)     Wound 03/17/24 0045 Pressure Injury Thigh Left;Posterior Stage 2 -  Partial thickness loss of dermis presenting as  a shallow open injury with a red, pink wound bed without slough. (Active)     Wound 03/17/24 0045 Pressure Injury Hip Lateral;Right Stage 2 -  Partial thickness loss of dermis presenting as a shallow open injury with a red, pink wound bed without slough. (Active)     Wound 03/17/24 0045 Pressure Injury Abdomen Lower;Left Stage 2 -  Partial thickness loss of dermis presenting as a shallow open injury with a red, pink wound bed without slough. (Active)     Wound 03/17/24 1119 Pressure Injury Buttocks Right;Left;Medial Stage 3 -  Full thickness tissue loss. Subcutaneous fat may be visible but bone, tendon or muscle are NOT exposed. (Active)      Data Review:    Recent Labs  Lab 03/16/24 1711 03/16/24 1754 03/18/24 0447 03/18/24 0649 03/18/24 1535 03/18/24 1639 03/18/24 2130 03/19/24 0347 03/20/24 0507 03/21/24 0524  WBC 11.6*   < > 12.6*  --  13.1*  --   --  14.1* 9.4 8.3  HGB 7.6*   < > 7.9*   < > 6.6*  --  7.7* 7.6* 7.6* 8.0*  HCT 23.3*   < > 23.6*   < > 20.5*  --  24.1* 23.5* 22.8* 24.7*  PLT 145*   < > 130*  --  93* 94*  --  82* 66* 82*  MCV 96.3   < > 94.0  --  99.0   --   --  95.1 93.8 96.5  MCH 31.4   < > 31.5  --  31.9  --   --  30.8 31.3 31.3  MCHC 32.6   < > 33.5  --  32.2  --   --  32.3 33.3 32.4  RDW 22.6*   < > 20.8*  --  20.2*  --   --  21.0* 20.8* 20.3*  LYMPHSABS 1.0  --   --   --   --   --   --   --   --   --   MONOABS 0.5  --   --   --   --   --   --   --   --   --   EOSABS 0.0  --   --   --   --   --   --   --   --   --   BASOSABS 0.0  --   --   --   --   --   --   --   --   --    < > = values in this interval not displayed.    Recent Labs  Lab 03/16/24 1711 03/16/24 1754 03/17/24 0122 03/17/24 0258 03/17/24 0639 03/17/24 0828 03/17/24 1627 03/17/24 1629 03/17/24 1835 03/18/24 0447 03/18/24 0645 03/18/24 0649 03/18/24 0902 03/18/24 1535 03/18/24 1639 03/19/24 0347 03/19/24 1643 03/20/24 0507 03/20/24 1702 03/21/24 0524  NA 135   < >  --    < >  --  135  --    < >  --  133*  --    < >  --    < >  --  136 134* 136 137 137  K 4.2   < >  --    < >  --  4.5  --    < >  --  4.3  --    < >  --    < >  --  3.7 4.0 3.7 3.6 3.5  CL 97*  --   --    < >  --  96*  --    < >  --  92*  --   --   --    < >  --  99 100 103 104 105  CO2 14*  --   --    < >  --  14*  --    < >  --  26  --   --   --    < >  --  23 22 27 24 25   ANIONGAP 25*  --   --    < >  --  25*  --    < >  --  15  --   --   --    < >  --  15 12 7 9 8   GLUCOSE 75  --   --    < >  --  131*  --    < >  --  180*  --   --   --    < >  --  138* 160* 182* 269* 208*  BUN 23*  --   --    < >  --  22*  --    < >  --  15  --   --   --    < >  --  11 10 10 13 19   CREATININE 7.04*  --   --    < >  --  6.50*  --    < >  --  3.94*  --   --   --    < >  --  2.10* 1.55* 1.32* 1.45* 1.84*  AST 35  --   --   --   --   --   --   --   --   --   --   --   --   --   --  15  --   --   --   --   ALT <5  --   --   --   --   --   --   --   --   --   --   --   --   --   --  <5  --   --   --   --   ALKPHOS 93  --   --   --   --   --   --   --   --   --   --   --   --   --   --  75  --   --   --   --    BILITOT 0.4  --   --   --   --   --   --   --   --   --   --   --   --   --  0.7 0.7  --   --   --   --   ALBUMIN  1.9*  --   --   --   --  2.1*  --    < >  --  2.2*  --   --   --    < >  --  1.9*  1.9* 1.8* 2.0* 2.2* 2.1*  DDIMER  --   --   --   --   --   --   --   --  0.63*  --   --   --   --   --  1.05*  --   --   --   --   --   LATICACIDVEN  --    < > 9.2*  --  8.2* 7.3* 4.1*  --   --   --   --   --  1.2  --   --   --   --   --   --   --   INR  --   --   --   --   --   --   --   --  1.9*  --   --   --   --   --  1.8*  --   --   --   --   --   AMMONIA  --   --   --   --   --   --   --   --   --   --  31  --   --   --   --   --   --   --   --   --   MG 2.0  --   --   --   --   --   --   --   --  2.1  --   --   --   --   --  2.1  --  2.3  --  2.3  PHOS 6.3*  --   --   --   --  6.1*  --    < >  --  4.1  --   --   --    < >  --  2.4* 3.2 1.8* 2.4* 2.3*  CALCIUM  7.8*  --   --    < >  --  8.1*  --    < >  --  7.6*  --   --   --    < >  --  7.4* 7.5* 7.6* 7.4* 7.6*   < > = values in this interval not displayed.      Recent Labs  Lab 03/16/24 1711 03/16/24 2018 03/17/24 0122 03/17/24 0258 03/17/24 9360 03/17/24 9171 03/17/24 1627 03/17/24 1629 03/17/24 1835 03/18/24 0447 03/18/24 0645 03/18/24 0902 03/18/24 1535 03/18/24 1639 03/19/24 0347 03/19/24 1643 03/20/24 0507 03/20/24 1702 03/21/24 0524  DDIMER  --   --   --   --   --   --   --   --  0.63*  --   --   --   --  1.05*  --   --   --   --   --   LATICACIDVEN  --    < > 9.2*  --  8.2* 7.3* 4.1*  --   --   --   --  1.2  --   --   --   --   --   --   --   INR  --   --   --   --   --   --   --   --  1.9*  --   --   --   --  1.8*  --   --   --   --   --   AMMONIA  --   --   --   --   --   --   --   --   --   --  31  --   --   --   --   --   --   --   --  MG 2.0  --   --   --   --   --   --   --   --  2.1  --   --   --   --  2.1  --  2.3  --  2.3  CALCIUM  7.8*  --   --    < >  --  8.1*  --    < >  --  7.6*  --   --    < >  --  7.4*  7.5* 7.6* 7.4* 7.6*   < > = values in this interval not displayed.    --------------------------------------------------------------------------------------------------------------- Lab Results  Component Value Date   CHOL 104 07/22/2019   HDL 30 (L) 07/22/2019   LDLCALC 37 07/22/2019   TRIG 185 (H) 07/22/2019   CHOLHDL 3.5 07/22/2019    Lab Results  Component Value Date   HGBA1C 4.3 (L) 02/15/2024   No results for input(s): TSH, T4TOTAL, FREET4, T3FREE, THYROIDAB in the last 72 hours. No results for input(s): VITAMINB12, FOLATE, FERRITIN, TIBC, IRON , RETICCTPCT in the last 72 hours. ------------------------------------------------------------------------------------------------------------------ Cardiac Enzymes No results for input(s): CKMB, TROPONINI, MYOGLOBIN in the last 168 hours.  Invalid input(s): CK  Radiology Report DG Chest Port 1 View Result Date: 03/22/2024 EXAM: 1 VIEW(S) XRAY OF THE CHEST 03/22/2024 12:07:00 PM COMPARISON: 03/16/2024 CLINICAL HISTORY: SOB (shortness of breath) FINDINGS: LINES, TUBES AND DEVICES: Right chest wall dual lumen central venous catheter in place with tip terminating over the superior right atrium. Enteric tube in place coursing through the chest to the abdomen beyond the field-of-view. LUNGS AND PLEURA: Low lung volumes. No focal pulmonary opacity. No pleural effusion. No pneumothorax. HEART AND MEDIASTINUM: No acute abnormality of the cardiac and mediastinal silhouettes. BONES AND SOFT TISSUES: Degenerative changes of bilateral shoulders. Bilateral high-riding humeral heads with significant decrease in acromiohumeral interval. No acute osseous abnormality. IMPRESSION: 1. No acute findings. 2. Low lung volumes. 3. Advanced arthropathic changes involving both shoulders with suspected bilateral rotator cuff pathology resulting in high-riding humeral heads. Electronically signed by: Waddell Calk MD 03/22/2024 03:47 PM  EST RP Workstation: [id number]     Signature  -   Lavada Stank M.D on 03/23/2024 at 10:32 AM   -  To page go to [email or web address]   "

## 2024-03-23 NOTE — Progress Notes (Signed)
 Patients Flexiseal is out and doesnot wish for reinsertion.

## 2024-03-23 NOTE — Progress Notes (Addendum)
 Nutrition Follow-up  DOCUMENTATION CODES:  Obesity unspecified  INTERVENTION:  D/C tube feeding, Cortrak is out. Add snacks BID between meals. Add Boost Breeze po once daily, each supplement provides 250 kcal and 9 grams of protein.  NUTRITION DIAGNOSIS:  Moderate Malnutrition related to acute illness as evidenced by mild fat depletion, moderate muscle depletion, edema; ongoing.  GOAL:  Patient will meet greater than or equal to 90% of their needs; progressing.  MONITOR:  Diet advancement, TF tolerance, Skin, Weight trends, Labs, I & O's  REASON FOR ASSESSMENT:  Consult Assessment of nutrition requirement/status  ASSESSMENT:  59 y/o female with h/o HLD, stroke, fibromyalgia, NICM, HTN, IDDM, gastroparesis, cholecystectomy, substance abuse, gout, GERD, anxiety, PUD, C diff, NASH, duodenal carcinoid s/p resection (2022), chronic pain, PAF, CHF, ESRD on HD, gastric AVM, PVD s/p R BKA, TD and recently hospitalized 02/14/2024-02/25/2024 for sepsis secondary to left thigh abscess and underwent I&D with gen surg 02/16/2024 and who is anow admitted with AMS, syncope, septic shock and K pneumoniae bacteremia.  Spoke with patient and her mother in the room with her. Patient has been eating better today than since admission. She does not like any supplements, but Mom is trying to encourage her to take them. Will add an HS snack for patient, patient is agreeable. Per discussion with nurse, patient ate an order of onion rings that friends brought to her earlier today.   TF was stopped on 1/14. Cortrak removed this morning.  Patient is now on a regular diet with 1800 ml fluid restriction.  Meal intakes not recorded.  L groin wound is healing S/P debridement for necrotizing fasciitis.  Admit weight: 103.1 kg Current weight: 103.6 kg Received CRRT 1/10-1/13. Refused HD 1/15 d/t pain. Plans for HD tomorrow.  Labs reviewed.  K 3.5 WNL on 1/14 Phosphorus 2.3 L on 1/14 CBG:  [phone]  Medications reviewed and include vitamin C , aranesp , pepcid , novolog , lantus , rena-vit, thiamine .   Diet Order:   Diet Order             Diet regular Room service appropriate? Yes; Fluid consistency: Thin; Fluid restriction: 1800 mL Fluid  Diet effective now                   EDUCATION NEEDS:  Not appropriate for education at this time  Skin:  Skin Assessment: Skin Integrity Issues: (Stage 3 PI b/l medial buttocks, full thickness L groin s/p I&D 02/16/2024, Stage 2 Pressure Injury L posterior thigh pink) Skin Integrity Issues:: Stage II, Unstageable, Stage III Stage II: abdomen, R hip, L thigh Stage III: R buttocks Unstageable: L foot  Last BM:  1/14 type 7  Height:  Ht Readings from Last 1 Encounters:  02/16/24 5' 6 (1.676 m)   Weight:  Wt Readings from Last 1 Encounters:  03/23/24 103.6 kg   Ideal Body Weight:  59 kg  BMI:  Body mass index is 36.86 kg/m.  Estimated Nutritional Needs:  Kcal:  1700-1900 Protein:  80-90 gm Fluid:  UOP +1L   Suzen HUNT RD, LDN, CNSC Contact via secure chat. If unavailable, use group chat RD Inpatient.

## 2024-03-23 NOTE — TOC Progression Note (Signed)
 Transition of Care [facility]) - Progression Note    Patient Details  Name: Deborah [name] MRN: [id number] Date of Birth: 10-25-1965  Transition of Care [facility]) CM/SW Contact  Inocente GORMAN Kindle, LCSW Phone Number: 03/23/2024, 9:13 AM  Clinical Narrative:    CSW continuing to follow. Patient still with Cortrak.    Expected Discharge Plan: Long Term Nursing Home Barriers to Discharge: Continued Medical Work up               Expected Discharge Plan and Services       Living arrangements for the past 2 months: Skilled Nursing Facility                                       Social Drivers of Health (SDOH) Interventions SDOH Screenings   Food Insecurity: No Food Insecurity (03/18/2024)  Housing: Low Risk (03/18/2024)  Transportation Needs: No Transportation Needs (03/18/2024)  Utilities: Not At Risk (03/18/2024)  Recent Concern: Utilities - At Risk (02/18/2024)  Depression (PHQ2-9): Medium Risk (03/16/2023)  Financial Resource Strain: Low Risk (03/23/2021)   Received from [facility])  Social Connections: Low Risk (03/23/2021)   Received from [facility])  Stress: Low Risk (03/23/2021)   Received from [facility])  Tobacco Use: Low Risk (03/18/2024)    Readmission Risk Interventions    02/20/2024   11:05 AM 03/07/2023    4:19 PM 11/25/2022    5:16 PM  Readmission Risk Prevention Plan  Transportation Screening Complete Complete Complete  Medication Review (RN Care Manager) Complete Complete Complete  PCP or Specialist appointment within 3-5 days of discharge Complete Complete Complete  HRI or Home Care Consult Complete Complete Complete  SW Recovery Care/Counseling Consult Complete Complete Complete  Palliative Care Screening Not Applicable Complete Not Applicable  Skilled Nursing Facility Complete Not Applicable Not Applicable

## 2024-03-24 DIAGNOSIS — I4891 Unspecified atrial fibrillation: Secondary | ICD-10-CM

## 2024-03-24 DIAGNOSIS — R55 Syncope and collapse: Secondary | ICD-10-CM | POA: Diagnosis not present

## 2024-03-24 LAB — RENAL FUNCTION PANEL
Albumin: 1.8 g/dL — ABNORMAL LOW (ref 3.5–5.0)
Anion gap: 10 (ref 5–15)
BUN: 19 mg/dL (ref 6–20)
CO2: 24 mmol/L (ref 22–32)
Calcium: 7.1 mg/dL — ABNORMAL LOW (ref 8.9–10.3)
Chloride: 101 mmol/L (ref 98–111)
Creatinine, Ser: 2.33 mg/dL — ABNORMAL HIGH (ref 0.44–1.00)
GFR, Estimated: 23 mL/min — ABNORMAL LOW
Glucose, Bld: 139 mg/dL — ABNORMAL HIGH (ref 70–99)
Phosphorus: 2.9 mg/dL (ref 2.5–4.6)
Potassium: 3.6 mmol/L (ref 3.5–5.1)
Sodium: 135 mmol/L (ref 135–145)

## 2024-03-24 LAB — BASIC METABOLIC PANEL WITH GFR
Anion gap: 10 (ref 5–15)
BUN: 37 mg/dL — ABNORMAL HIGH (ref 6–20)
CO2: 23 mmol/L (ref 22–32)
Calcium: 6.9 mg/dL — ABNORMAL LOW (ref 8.9–10.3)
Chloride: 103 mmol/L (ref 98–111)
Creatinine, Ser: 4.11 mg/dL — ABNORMAL HIGH (ref 0.44–1.00)
GFR, Estimated: 12 mL/min — ABNORMAL LOW
Glucose, Bld: 87 mg/dL (ref 70–99)
Potassium: 3.9 mmol/L (ref 3.5–5.1)
Sodium: 136 mmol/L (ref 135–145)

## 2024-03-24 LAB — CBC WITH DIFFERENTIAL/PLATELET
Abs Immature Granulocytes: 0.15 K/uL — ABNORMAL HIGH (ref 0.00–0.07)
Basophils Absolute: 0 K/uL (ref 0.0–0.1)
Basophils Relative: 0 %
Eosinophils Absolute: 0.2 K/uL (ref 0.0–0.5)
Eosinophils Relative: 1 %
HCT: 24.2 % — ABNORMAL LOW (ref 36.0–46.0)
Hemoglobin: 7.7 g/dL — ABNORMAL LOW (ref 12.0–15.0)
Immature Granulocytes: 1 %
Lymphocytes Relative: 11 %
Lymphs Abs: 1.9 K/uL (ref 0.7–4.0)
MCH: 31.2 pg (ref 26.0–34.0)
MCHC: 31.8 g/dL (ref 30.0–36.0)
MCV: 98 fL (ref 80.0–100.0)
Monocytes Absolute: 0.9 K/uL (ref 0.1–1.0)
Monocytes Relative: 6 %
Neutro Abs: 13.6 K/uL — ABNORMAL HIGH (ref 1.7–7.7)
Neutrophils Relative %: 81 %
Platelets: 218 K/uL (ref 150–400)
RBC: 2.47 MIL/uL — ABNORMAL LOW (ref 3.87–5.11)
RDW: 20.5 % — ABNORMAL HIGH (ref 11.5–15.5)
WBC: 16.8 K/uL — ABNORMAL HIGH (ref 4.0–10.5)
nRBC: 0.1 % (ref 0.0–0.2)

## 2024-03-24 LAB — GLUCOSE, CAPILLARY
Glucose-Capillary: 128 mg/dL — ABNORMAL HIGH (ref 70–99)
Glucose-Capillary: 156 mg/dL — ABNORMAL HIGH (ref 70–99)
Glucose-Capillary: 204 mg/dL — ABNORMAL HIGH (ref 70–99)
Glucose-Capillary: 90 mg/dL (ref 70–99)

## 2024-03-24 LAB — MAGNESIUM: Magnesium: 1.9 mg/dL (ref 1.7–2.4)

## 2024-03-24 MED ORDER — FAMOTIDINE 20 MG PO TABS
20.0000 mg | ORAL_TABLET | Freq: Every day | ORAL | Status: AC
  Administered 2024-03-25 – 2024-04-13 (×18): 20 mg via ORAL
  Filled 2024-03-24 (×20): qty 1

## 2024-03-24 MED ORDER — MIDODRINE HCL 5 MG PO TABS
ORAL_TABLET | ORAL | Status: AC
  Filled 2024-03-24: qty 2

## 2024-03-24 MED ORDER — BUSPIRONE HCL 5 MG PO TABS
5.0000 mg | ORAL_TABLET | Freq: Three times a day (TID) | ORAL | Status: AC
  Administered 2024-03-25 – 2024-04-13 (×49): 5 mg via ORAL
  Filled 2024-03-24 (×53): qty 1

## 2024-03-24 MED ORDER — HYDROMORPHONE HCL 1 MG/ML IJ SOLN
INTRAMUSCULAR | Status: AC
  Filled 2024-03-24: qty 0.5

## 2024-03-24 MED ORDER — HEPARIN SODIUM (PORCINE) 1000 UNIT/ML IJ SOLN: 3800.0000 [IU] | Freq: Once | INTRAMUSCULAR | Status: AC

## 2024-03-24 MED ORDER — MIDODRINE HCL 5 MG PO TABS
10.0000 mg | ORAL_TABLET | Freq: Three times a day (TID) | ORAL | Status: DC
  Administered 2024-03-25 – 2024-03-26 (×5): 10 mg via ORAL
  Filled 2024-03-24 (×5): qty 2

## 2024-03-24 MED ORDER — VITAMIN C 500 MG PO TABS
500.0000 mg | ORAL_TABLET | Freq: Two times a day (BID) | ORAL | Status: AC
  Administered 2024-03-25 – 2024-04-13 (×32): 500 mg via ORAL
  Filled 2024-03-24 (×36): qty 1

## 2024-03-24 MED ORDER — ACETAMINOPHEN 325 MG PO TABS
650.0000 mg | ORAL_TABLET | Freq: Four times a day (QID) | ORAL | Status: DC | PRN
  Filled 2024-03-24: qty 2

## 2024-03-24 MED ORDER — ATORVASTATIN CALCIUM 10 MG PO TABS
10.0000 mg | ORAL_TABLET | Freq: Every day | ORAL | Status: AC
  Administered 2024-03-25 – 2024-04-13 (×16): 10 mg via ORAL
  Filled 2024-03-24 (×17): qty 1

## 2024-03-24 MED ORDER — HEPARIN SODIUM (PORCINE) 1000 UNIT/ML IJ SOLN
3800.0000 [IU] | Freq: Once | INTRAMUSCULAR | Status: AC
  Administered 2024-03-24: 3800 [IU]

## 2024-03-24 MED ORDER — HEPARIN SODIUM (PORCINE) 1000 UNIT/ML IJ SOLN
INTRAMUSCULAR | Status: AC
  Filled 2024-03-24: qty 4

## 2024-03-24 NOTE — Progress Notes (Signed)
 " Cardiology Progress Note  Patient ID: Deborah [name] MRN: [id number] DOB: [birth year] Date of Encounter: 03/24/2024 Primary Cardiologist: Wilbert Bihari, MD  Subjective   Chief Complaint: SOB  HPI: Appears to be in sinus rhythm with PACs.  Reports she feels weak and fatigued.  ROS:  All other ROS reviewed and negative. Pertinent positives noted in the HPI.     Telemetry  Overnight telemetry shows sinus rhythm with PACs heart rate 90s, which I personally reviewed.    Physical Exam   Vitals:   03/24/24 0926 03/24/24 0932 03/24/24 0958 03/24/24 1000  BP: (!) 90/53 (!) 90/48 (!) 86/52   Pulse: 100 97 97 90  Resp: 11 14 15 15   Temp: 100 F (37.8 C)     TempSrc:      SpO2: 100% 100% 100% 100%  Weight: 103.8 kg       Intake/Output Summary (Last 24 hours) at 03/24/2024 1021 Last data filed at 03/23/2024 2211 Gross per 24 hour  Intake 400 ml  Output 700 ml  Net -300 ml       03/24/2024    9:26 AM 03/24/2024    9:24 AM 03/24/2024    5:00 AM  Last 3 Weights  Weight (lbs) 228 lb 13.4 oz 228 lb 13.4 oz 223 lb 8.7 oz  Weight (kg) 103.8 kg 103.8 kg 101.4 kg    Body mass index is 36.94 kg/m.  General: Well nourished, well developed, in no acute distress Head: Atraumatic, normal size  Eyes: PEERLA, EOMI  Neck: Supple, no JVD Endocrine: No thryomegaly Cardiac: Normal S1, S2; irregular rhythm, no murmurs Lungs: Clear to auscultation bilaterally, no wheezing, rhonchi or rales  Abd: Soft, nontender, no hepatomegaly  Ext: Status post right BKA Musculoskeletal: No deformities, BUE and BLE strength normal and equal Skin: Warm and dry, no rashes   Neuro: Alert and oriented to person, place, time, and situation, CNII-XII grossly intact, no focal deficits  Psych: Normal mood and affect   Cardiac Studies  TTEE 03/18/2024  1. Left ventricular ejection fraction, by estimation, is 60 to 65%. The  left ventricle has normal function. There is mild concentric left  ventricular  hypertrophy. Left ventricular diastolic parameters were  normal.   2. Basal and apical function preserved but unable to assess free wall  function due to poor visualization. Right ventricular systolic function  was not well visualized. The right ventricular size is not well  visualized.   3. There is a small pericardial effusion located posteriorly (#21) . a  small pericardial effusion is present. The pericardial effusion is  posterior to the left ventricle.   4. The mitral valve is normal in structure. No evidence of mitral valve  regurgitation. No evidence of mitral stenosis.   5. Calcified nodule in left coronary cusp. The aortic valve is abnormal.  Aortic valve regurgitation is not visualized. No aortic stenosis is  present.   6. The inferior vena cava is dilated in size with <50% respiratory  variability, suggesting right atrial pressure of 15 mmHg.   Patient Profile  Deborah [name] is a 59 y.o. female with ESRD on hemodialysis, paroxysmal atrial fibrillation, hypertension, stroke, PAD status post right BKA admitted on 03/16/2024 for septic shock secondary to Klebsiella bacteremia and encephalopathy.  Cardiology consulted for atrial fibrillation with RVR.  Assessment & Plan   # Atrial fibrillation with RVR - She has chronic hypotension and is on hemodialysis.  Was placed on IV amiodarone  therapy by cardiology yesterday.  Has  converted to sinus rhythm with PACs.  Will continue IV amiodarone  today.  Likely transition to amiodarone  oral tomorrow. - Hold any AV nodal agents.  Blood pressure will not allow this.  She is on midodrine  chronically at baseline.  Echo is reassuring. - She has chronic anemia.  Has required transfusions.  She is not on anticoagulation due to history of GI bleed in the past.  Given that she is requiring transfusion with chronic anemia likely not a great candidate for anticoagulation.  For now we can continue with holding this.  # ESRD on hemodialysis #  Chronic hypotension - Continue midodrine .  Volume status managed by nephrology.  # Septic shock # Klebsiella bacteremia # Encephalopathy - Condition is improving.     For questions or updates, please contact [facility] Please consult [email or web address] for contact info under        Signed, Darryle T. Barbaraann, MD, [facility]   [facility]  03/24/2024 10:21 AM   "

## 2024-03-24 NOTE — Progress Notes (Signed)
 Patient refusing HD. MD aware.

## 2024-03-24 NOTE — Plan of Care (Signed)
  Problem: Clinical Measurements: Goal: Ability to maintain clinical measurements within normal limits will improve Outcome: Progressing Goal: Will remain free from infection Outcome: Progressing   Problem: Activity: Goal: Risk for activity intolerance will decrease Outcome: Progressing   Problem: Nutrition: Goal: Adequate nutrition will be maintained Outcome: Progressing   Problem: Elimination: Goal: Will not experience complications related to bowel motility Outcome: Progressing   Problem: Pain Managment: Goal: General experience of comfort will improve and/or be controlled Outcome: Progressing   Problem: Skin Integrity: Goal: Risk for impaired skin integrity will decrease Outcome: Progressing

## 2024-03-24 NOTE — Progress Notes (Signed)
 " [facility] Progress Note   Subjective:    Seen and examined patient on dialysis. Initially refused HD again today but staff has been helpful with convincing her. Tolerating treatment. Same complaints of body hurting all over  Objective Vitals:   03/24/24 0700 03/24/24 0756 03/24/24 0924 03/24/24 0926  BP: (!) 96/51   (!) 90/53  Pulse: (!) 102   100  Resp: 20   11  Temp:  99.6 F (37.6 C) 100 F (37.8 C) 100 F (37.8 C)  TempSrc:  Axillary    SpO2: 99%   100%  Weight:   103.8 kg 103.8 kg   Physical Exam Gen: chronically ill appearing CVS: RRR Resp: normal wob/unlabored Abd: obese, soft Ext: right BKA, trace dependent edema Neuro: awake, alert Dialysis access: RIJ [facility]  Filed Weights   03/24/24 0500 03/24/24 0924 03/24/24 0926  Weight: 101.4 kg 103.8 kg 103.8 kg    Intake/Output Summary (Last 24 hours) at 03/24/2024 0930 Last data filed at 03/23/2024 2211 Gross per 24 hour  Intake 400 ml  Output 700 ml  Net -300 ml    Additional Objective Labs: Basic Metabolic Panel: Recent Labs  Lab 03/20/24 0507 03/20/24 1702 03/21/24 0524  NA 136 137 137  K 3.7 3.6 3.5  CL 103 104 105  CO2 27 24 25   GLUCOSE 182* 269* 208*  BUN 10 13 19   CREATININE 1.32* 1.45* 1.84*  CALCIUM  7.6* 7.4* 7.6*  PHOS 1.8* 2.4* 2.3*   Liver Function Tests: Recent Labs  Lab 03/18/24 1639 03/19/24 0347 03/19/24 1643 03/20/24 0507 03/20/24 1702 03/21/24 0524  AST  --  15  --   --   --   --   ALT  --  <5  --   --   --   --   ALKPHOS  --  75  --   --   --   --   BILITOT 0.7 0.7  --   --   --   --   PROT  --  4.4*  --   --   --   --   ALBUMIN   --  1.9*  1.9*   < > 2.0* 2.2* 2.1*   < > = values in this interval not displayed.   No results for input(s): LIPASE, AMYLASE in the last 168 hours. CBC: Recent Labs  Lab 03/18/24 0447 03/18/24 0649 03/18/24 1535 03/18/24 1639 03/19/24 0347 03/20/24 0507 03/21/24 0524  WBC 12.6*  --  13.1*  --  14.1* 9.4 8.3   HGB 7.9*   < > 6.6*   < > 7.6* 7.6* 8.0*  HCT 23.6*   < > 20.5*   < > 23.5* 22.8* 24.7*  MCV 94.0  --  99.0  --  95.1 93.8 96.5  PLT 130*  --  93*   < > 82* 66* 82*   < > = values in this interval not displayed.   Blood Culture    Component Value Date/Time   SDES BLOOD RIGHT ANTECUBITAL 03/16/2024 1949   SPECREQUEST  03/16/2024 1949    BOTTLES DRAWN AEROBIC AND ANAEROBIC Blood Culture results may not be optimal due to an inadequate volume of blood received in culture bottles   CULT (A) 03/16/2024 1949    KLEBSIELLA PNEUMONIAE Confirmed Extended Spectrum Beta-Lactamase Producer (ESBL).  In bloodstream infections from ESBL organisms, carbapenems are preferred over piperacillin /tazobactam. They are shown to have a lower risk of mortality.    REPTSTATUS 03/19/2024 FINAL 03/16/2024 1949  Cardiac Enzymes: No results for input(s): CKTOTAL, CKMB, CKMBINDEX, TROPONINI in the last 168 hours. CBG: Recent Labs  Lab 03/23/24 1206 03/23/24 1631 03/23/24 2004 03/24/24 0025 03/24/24 0755  GLUCAP 157* 250* 194* 204* 90   Iron  Studies: No results for input(s): IRON , TIBC, TRANSFERRIN, FERRITIN in the last 72 hours. Lab Results  Component Value Date   INR 1.8 (H) 03/18/2024   INR 1.9 (H) 03/17/2024   INR 1.5 (H) 03/22/2023   Studies/Results: DG Chest Port 1 View Result Date: 03/22/2024 EXAM: 1 VIEW(S) XRAY OF THE CHEST 03/22/2024 12:07:00 PM COMPARISON: 03/16/2024 CLINICAL HISTORY: SOB (shortness of breath) FINDINGS: LINES, TUBES AND DEVICES: Right chest wall dual lumen central venous catheter in place with tip terminating over the superior right atrium. Enteric tube in place coursing through the chest to the abdomen beyond the field-of-view. LUNGS AND PLEURA: Low lung volumes. No focal pulmonary opacity. No pleural effusion. No pneumothorax. HEART AND MEDIASTINUM: No acute abnormality of the cardiac and mediastinal silhouettes. BONES AND SOFT TISSUES: Degenerative changes of  bilateral shoulders. Bilateral high-riding humeral heads with significant decrease in acromiohumeral interval. No acute osseous abnormality. IMPRESSION: 1. No acute findings. 2. Low lung volumes. 3. Advanced arthropathic changes involving both shoulders with suspected bilateral rotator cuff pathology resulting in high-riding humeral heads. Electronically signed by: Taylor Stroud MD 03/22/2024 03:47 PM EST RP Workstation: [id number]    Medications:  amiodarone  30 mg/hr (03/24/24 0037)   anticoagulant sodium citrate      meropenem  (MERREM ) IV 1 g (03/23/24 2216)    ascorbic acid   500 mg Per Tube BID   atorvastatin   10 mg Per Tube Daily   busPIRone   5 mg Per Tube TID   collagenase    Topical Daily   darbepoetin (ARANESP ) injection - DIALYSIS  150 mcg Subcutaneous Q Tue-1800   diltiazem   30 mg Oral Q12H   famotidine   20 mg Per Tube QHS   feeding supplement  1 Container Oral Q24H   insulin  aspart  0-15 Units Subcutaneous Q4H   insulin  glargine  5 Units Subcutaneous QHS   midodrine   10 mg Per Tube Q8H   multivitamin  1 tablet Oral QHS   mouth rinse  15 mL Mouth Rinse 4 times per day   risperiDONE   1 mg Oral QHS   sodium chloride  flush  10-40 mL Intracatheter Q12H   thiamine   100 mg Per Tube Daily   vancomycin   125 mg Per Tube Daily   Or   vancomycin   125 mg Oral Daily    Dialysis Orders: [place] [facility] 4h  B400   96.1kg  TDC   Hep none Last OP HD 1/08, post wt 100kg  Assessment/Plan: ESRD - on HD TTS OP -s/p CRRT 1/10-1/13 given significant shock - HD back on TTS schedule however will reassess for possible HD needs this coming Monday  PAfib -cardiology following, on IV amio   Septic shock/ hypotension - s/p pressors - getting po midodrine  10 tid   Sepsis -secondary to klebsiella pn bacteremia -on meropenem  and vanc -also on po vanc -per primary service -repeat bcx--if not cleared then will need to attempt line holiday (has RIJ TDC)-------pending   Volume - UF as tolerated  with midodrine  support   Anemia of esrd - follow, transfuse prn  -avoid IV Fe for now, started aranesp  1/13 -hgb 8   Diarrhea - getting empiric po vanc - had diarrhea and concern for Cdif, neg testing 1/10   Wounds -mgmt per primary service  Ephriam Stank,  MD [facility] 03/24/2024,9:30 AM  LOS: 8 days    "

## 2024-03-24 NOTE — Progress Notes (Signed)
" °   03/24/24 1322  Vitals  Temp 98.4 F (36.9 C)  Pulse Rate (!) 101  Resp 12  BP 110/60  SpO2 100 %  O2 Device Nasal Cannula  Type of Weight Post-Dialysis  Oxygen Therapy  O2 Flow Rate (L/min) 3 L/min  Patient Activity (if Appropriate) In bed  Pulse Oximetry Type Continuous  Oximetry Probe Site Changed No  Post Treatment  Dialyzer Clearance Lightly streaked  Liters Processed 83.9  Fluid Removed (mL) -500 mL  Tolerated HD Treatment Yes   Received patient in bed to unit.  Alert and oriented.  Informed consent signed and in chart.   TX duration: 3.5  Patient tolerated well.  Transported back to the room  Alert, without acute distress.  Hand-off given to patient's nurse.   Access used: Yes Access issues: No  Total UF removed: -500 Medication(s) given: See MAR Post HD VS: See Above Grid Post HD weight: 101.4 kg   [name] "

## 2024-03-24 NOTE — Progress Notes (Signed)
 "                                                                                                                                                                                                                                                                                PROGRESS NOTE     Patient Demographics:    Deborah [name], is a 59 y.o. female, DOB - 1965/08/13, FMW:[id number]  Outpatient Primary MD for the patient is Pcp, No    LOS - 8  Admit date - 03/16/2024    Chief Complaint  Patient presents with   Loss of Consciousness       Brief Narrative (HPI from H&P)   59 yo female with past medical history significant for PAF, HLD, right BKA, ESRD on HD (TTS) who presented from SNF to [facility] after syncopal episode with +LOC. Patient hemodynamically unstable in ED despite fluid resuscitation with 3L crystalloid and was subsequently started on vasopressors. Labs notable for metabolic acidosis, sCr 7.04, BUN 23, iCa 1.00, troponin 105, lactic acic 9.0, given 1 amp HCO3. On exam patient with diffuse abdominal pain, exam somewhat limited as she has chronic pain managed daily with Dilaudid . Patient started empirically on Cefepime , Vanc, Flagyl ; CT A/P pending, and syncope workup in progress. PCCM consulted for ICU admission.   Of note, patient recently hospitalized 02/14/2024-02/25/2024 for sepsis secondary to left thigh abscess and underwent I&D with gen surg 02/16/2024.  She was diagnosed with sepsis and subsequently Klebsiella bacteremia she was placed on IV meropenem , due to low blood pressures and underlying ESRD she was seen by nephrology and underwent multiple sessions of CRRT in ICU, she was also placed on vancomycin  prophylactically to prevent reoccurrence of C. difficile.  She was somewhat stabilized and transferred to my care on 03/22/2024 on day 6 of her hospital stay, currently blood pressure in 80s, still has NG tube, minimally responsive with generalized pain including abdominal  pain.   Significant Hospital Events: Including procedures, antibiotic start and stop dates in addition to other pertinent events   1/9 ED s/p witnessed syncopal event w/ +LOC in shock, started on Levo/Vaso; Aline/CVC placed 1/10.  CT abdomen pelvis in the ER.  Nonacute.  1/10 CRRT, still on vaso, NE. Esclated to meropenem  for ESBL Klebsiella 1/11 echocardiogram EF 60%.  No acute changes 1/12: off levophed , on vasopressin  1/13: off vasopressin , steroids, and CRRT 1/14: transfer out of ICU, TRH to pick up 1/15    Subjective:   Patient in bed appears to be in no distress whatsoever, no headache or chest pain at this time, does not want to go to dialysis despite counseling.   Assessment  & Plan :    Septic shock due to Klebsiella pneumoniae bacteremia, Acute metabolic encephalopathy due to sepsis, improved, Chronic Hypotension   -Nephrology okay with keeping RIJ for now, CT abdomen pelvis negative, follow chest x-ray, on IV meropenem  with stop date of 03/31/2024, on prophylactic oral vancomycin  due to previous history of C. difficile with the same stop date.  Initially required Levophed  and stress dose steroids, sepsis pathophysiology has resolved.  Still on midodrine  but takes that at home as well.  Continue to monitor, so far source of infection likely wounds according to the ICU team.  Will check repeat blood cultures on 03/22/2024 and monitor.    ESRD on HD (TTS) - nephrology on board initially required CRRT in ICU, defer HD to nephrology.  Noncompliant with treatments refusing dialysis despite counseling on 03/24/2024.  Chronic hypotension-takes midodrine  baseline continue, minimize narcotic intake.  Patient counseled.   Anemia of chronic disease  -drop in Hb may be dilutional. No evidence of DIC or bleeding, monitor   History of Cdiff  - cdiff negative,  on prophylactic dose PO vanc   Syncope; suspect due to sepsis  -echo: EF 60-605%, mild LVH, small pericardial effusion posteriorly     Troponin elevation; likely demand d/t shock state, PAF-not on anticoagulation due to past history of GI bleed, troponin elevation due to demand mismatch from sepsis, trend is flat and in non-ACS pattern, echo shows preserved EF and no wall motion abnormality.  Chest pain-free no further workup.  She developed RVR with hypotension for which she was seen by cardiology and started on amiodarone  drip on 03/23/2024.   HLD  - PTA statin   Chronic pain, Hx fibromyalgia, Acute on chronic abdominal pain, continue supportive care counseled to minimize narcotic intake as blood pressure at baseline is very low.  Hx duodenal carcinoid tumor s/p resection (2022), Hx gastric ulcer & gastritis (2024) - Chronic gastroparesis -con't H2-blocker; stable.   S/p I&D for left thigh abscess 02/16/24.  Requested Dr. Harden to reevaluate on 03/22/2024.   Drug-seeking behavior, Opioid-use disorder, Hx substance abuse-- as an OP following with [facility], minimize narcotics.   Wounds present on admission-- left thigh, R hip, lower abdomen, buttocks, L heel. turns, WOC as needed Dr. Harden to evaluate left thigh postop site.  PVD s/p R BKA- stump is well healed   Sacral pressure injury - turns, unfortunately needs pressors, hard to keep that skin clear with frequent diarrhea   Possible vaginal yeast infection  -Clotrimazole  cream daily x7 days   Anxiety/Depression -Resume home buspar , risperidone , trazodone  when appropriate> waiting for mental status to normalize some   DM2; hypoglycemia from sepsis corrected with steroids -SSI PRN -goal BG 140-180  Lab Results  Component Value Date   HGBA1C 4.3 (L) 02/15/2024   CBG (last 3)  Recent Labs    03/23/24 2004 03/24/24 0025 03/24/24 0755  GLUCAP 194* 204* 90        Condition - Extremely Guarded  Family Communication  : None present  Code Status : Full  code  Consults  : PCCM, nephrology, Dr. Harden  PUD Prophylaxis :    Procedures  :            Disposition Plan  :    Status is: Inpatient   DVT Prophylaxis  :    Place and maintain sequential compression device Start: 03/20/24 1800  Lab Results  Component Value Date   PLT 82 (L) 03/21/2024    Diet :  Diet Order             Diet regular Room service appropriate? Yes; Fluid consistency: Thin; Fluid restriction: 1800 mL Fluid  Diet effective now                    Inpatient Medications  Scheduled Meds:  ascorbic acid   500 mg Per Tube BID   atorvastatin   10 mg Per Tube Daily   busPIRone   5 mg Per Tube TID   collagenase    Topical Daily   darbepoetin (ARANESP ) injection - DIALYSIS  150 mcg Subcutaneous Q Tue-1800   diltiazem   30 mg Oral Q12H   famotidine   20 mg Per Tube QHS   feeding supplement  1 Container Oral Q24H   insulin  aspart  0-15 Units Subcutaneous Q4H   insulin  glargine  5 Units Subcutaneous QHS   midodrine   10 mg Per Tube Q8H   multivitamin  1 tablet Oral QHS   mouth rinse  15 mL Mouth Rinse 4 times per day   risperiDONE   1 mg Oral QHS   sodium chloride  flush  10-40 mL Intracatheter Q12H   thiamine   100 mg Per Tube Daily   vancomycin   125 mg Per Tube Daily   Or   vancomycin   125 mg Oral Daily   Continuous Infusions:  amiodarone  30 mg/hr (03/24/24 0037)   anticoagulant sodium citrate      meropenem  (MERREM ) IV 1 g (03/23/24 2216)   PRN Meds:.acetaminophen , alteplase , anticoagulant sodium citrate , diltiazem , HYDROmorphone  (DILAUDID ) injection, HYDROmorphone , hydrOXYzine , lidocaine  (PF), lidocaine -prilocaine , mouth rinse, oxyCODONE  **OR** [DISCONTINUED] oxyCODONE , pentafluoroprop-tetrafluoroeth, polyethylene glycol, senna    Objective:   Vitals:   03/24/24 0413 03/24/24 0500 03/24/24 0700 03/24/24 0756  BP: 100/65  (!) 96/51   Pulse: (!) 102  (!) 102   Resp: 16  20   Temp: 97.8 F (36.6 C)   99.6 F (37.6 C)  TempSrc: Oral   Axillary  SpO2: 99%  99%   Weight:  101.4 kg      Wt Readings from Last 3 Encounters:  03/24/24 101.4 kg   02/25/24 97.2 kg  06/20/23 114.5 kg     Intake/Output Summary (Last 24 hours) at 03/24/2024 9077 Last data filed at 03/23/2024 2211 Gross per 24 hour  Intake 400 ml  Output 700 ml  Net -300 ml     Physical Exam  Awake Alert, No new F.N deficits, right IJ HD catheter in place, NG tube in place [place].AT,PERRAL Supple Neck, No JVD,   Symmetrical Chest wall movement, Good air movement bilaterally, CTAB RRR,No Gallops,Rubs or new Murmurs,  +ve B.Sounds, Abd Soft, No tenderness,   No Cyanosis, Clubbing or edema     RN pressure injury documentation: Wound 02/19/24 0200 Pressure Injury Foot Left;Lateral Unstageable - Full thickness tissue loss in which the base of the injury is covered by slough (yellow, tan, gray, green or brown) and/or eschar (tan, brown or black) in the wound bed. (Active)     Wound 03/17/24 0045 Pressure Injury Thigh Left;Posterior Stage 2 -  Partial thickness loss of dermis presenting as a shallow open injury with a red, pink wound bed without slough. (Active)     Wound 03/17/24 0045 Pressure Injury Hip Lateral;Right Stage 2 -  Partial thickness loss of dermis presenting as a shallow open injury with a red, pink wound bed without slough. (Active)     Wound 03/17/24 0045 Pressure Injury Abdomen Lower;Left Stage 2 -  Partial thickness loss of dermis presenting as a shallow open injury with a red, pink wound bed without slough. (Active)     Wound 03/17/24 1119 Pressure Injury Buttocks Right;Left;Medial Stage 3 -  Full thickness tissue loss. Subcutaneous fat may be visible but bone, tendon or muscle are NOT exposed. (Active)      Data Review:    Recent Labs  Lab 03/18/24 0447 03/18/24 0649 03/18/24 1535 03/18/24 1639 03/18/24 2130 03/19/24 0347 03/20/24 0507 03/21/24 0524  WBC 12.6*  --  13.1*  --   --  14.1* 9.4 8.3  HGB 7.9*   < > 6.6*  --  7.7* 7.6* 7.6* 8.0*  HCT 23.6*   < > 20.5*  --  24.1* 23.5* 22.8* 24.7*  PLT 130*  --  93* 94*  --  82* 66* 82*   MCV 94.0  --  99.0  --   --  95.1 93.8 96.5  MCH 31.5  --  31.9  --   --  30.8 31.3 31.3  MCHC 33.5  --  32.2  --   --  32.3 33.3 32.4  RDW 20.8*  --  20.2*  --   --  21.0* 20.8* 20.3*   < > = values in this interval not displayed.    Recent Labs  Lab 03/17/24 1627 03/17/24 1629 03/17/24 1835 03/18/24 0447 03/18/24 0645 03/18/24 0649 03/18/24 0902 03/18/24 1535 03/18/24 1639 03/19/24 0347 03/19/24 1643 03/20/24 0507 03/20/24 1702 03/21/24 0524  NA  --    < >  --  133*  --    < >  --    < >  --  136 134* 136 137 137  K  --    < >  --  4.3  --    < >  --    < >  --  3.7 4.0 3.7 3.6 3.5  CL  --    < >  --  92*  --   --   --    < >  --  99 100 103 104 105  CO2  --    < >  --  26  --   --   --    < >  --  23 22 27 24 25   ANIONGAP  --    < >  --  15  --   --   --    < >  --  15 12 7 9 8   GLUCOSE  --    < >  --  180*  --   --   --    < >  --  138* 160* 182* 269* 208*  BUN  --    < >  --  15  --   --   --    < >  --  11 10 10 13 19   CREATININE  --    < >  --  3.94*  --   --   --    < >  --  2.10* 1.55* 1.32* 1.45* 1.84*  AST  --   --   --   --   --   --   --   --   --  15  --   --   --   --   ALT  --   --   --   --   --   --   --   --   --  <5  --   --   --   --   ALKPHOS  --   --   --   --   --   --   --   --   --  75  --   --   --   --   BILITOT  --   --   --   --   --   --   --   --  0.7 0.7  --   --   --   --   ALBUMIN   --    < >  --  2.2*  --   --   --    < >  --  1.9*  1.9* 1.8* 2.0* 2.2* 2.1*  DDIMER  --   --  0.63*  --   --   --   --   --  1.05*  --   --   --   --   --   LATICACIDVEN 4.1*  --   --   --   --   --  1.2  --   --   --   --   --   --   --   INR  --   --  1.9*  --   --   --   --   --  1.8*  --   --   --   --   --   AMMONIA  --   --   --   --  31  --   --   --   --   --   --   --   --   --   MG  --   --   --  2.1  --   --   --   --   --  2.1  --  2.3  --  2.3  PHOS  --    < >  --  4.1  --   --   --    < >  --  2.4* 3.2 1.8* 2.4* 2.3*  CALCIUM   --    < >  --   7.6*  --   --   --    < >  --  7.4* 7.5* 7.6* 7.4* 7.6*   < > = values in this interval not displayed.      Recent Labs  Lab 03/17/24 1627 03/17/24 1629 03/17/24 1835 03/18/24 0447 03/18/24 0645 03/18/24 0902 03/18/24 1535 03/18/24 1639 03/19/24 0347 03/19/24 1643 03/20/24 0507 03/20/24 1702 03/21/24 0524  DDIMER  --   --  0.63*  --   --   --   --  1.05*  --   --   --   --   --   LATICACIDVEN 4.1*  --   --   --   --  1.2  --   --   --   --   --   --   --   INR  --   --  1.9*  --   --   --   --  1.8*  --   --   --   --   --   AMMONIA  --   --   --   --  31  --   --   --   --   --   --   --   --   MG  --   --   --  2.1  --   --   --   --  2.1  --  2.3  --  2.3  CALCIUM   --    < >  --  7.6*  --   --    < >  --  7.4* 7.5* 7.6* 7.4* 7.6*   < > = values in this interval not displayed.    --------------------------------------------------------------------------------------------------------------- Lab Results  Component Value Date   CHOL 104 07/22/2019   HDL 30 (L) 07/22/2019   LDLCALC 37 07/22/2019   TRIG 185 (H) 07/22/2019   CHOLHDL 3.5 07/22/2019    Lab Results  Component Value Date   HGBA1C 4.3 (L) 02/15/2024   No results for input(s): TSH, T4TOTAL, FREET4, T3FREE, THYROIDAB in the last 72 hours. No results for input(s): VITAMINB12, FOLATE, FERRITIN, TIBC, IRON , RETICCTPCT in the last 72 hours. ------------------------------------------------------------------------------------------------------------------ Cardiac Enzymes No results for input(s): CKMB, TROPONINI, MYOGLOBIN in the last 168 hours.  Invalid input(s): CK  Radiology Report DG Chest Port 1 View Result Date: 03/22/2024 EXAM: 1 VIEW(S) XRAY OF THE CHEST 03/22/2024 12:07:00 PM COMPARISON: 03/16/2024 CLINICAL HISTORY: SOB (shortness of breath) FINDINGS: LINES, TUBES AND DEVICES: Right chest wall dual lumen central venous catheter in place with tip terminating over the superior  right atrium. Enteric tube in place coursing through the chest to the abdomen beyond the field-of-view. LUNGS AND PLEURA: Low lung volumes. No focal pulmonary opacity. No pleural effusion. No pneumothorax. HEART AND MEDIASTINUM: No acute abnormality of the cardiac and mediastinal silhouettes. BONES AND SOFT TISSUES: Degenerative changes of bilateral shoulders. Bilateral high-riding humeral heads with significant decrease in acromiohumeral interval. No acute osseous abnormality. IMPRESSION: 1. No acute findings. 2. Low lung volumes. 3. Advanced arthropathic changes involving both shoulders with suspected bilateral rotator cuff pathology resulting in high-riding humeral heads. Electronically signed by: Waddell Calk MD 03/22/2024 03:47 PM EST RP Workstation: [id number]     Signature  -   Lavada Stank M.D on 03/24/2024 at 9:22 AM   -  To page go to [email or web address]   "

## 2024-03-24 NOTE — Procedures (Signed)
 I was present at this dialysis session. I have reviewed the session itself and made appropriate changes.   Filed Weights   03/24/24 0500 03/24/24 0924 03/24/24 0926  Weight: 101.4 kg 103.8 kg 103.8 kg    Recent Labs  Lab 03/21/24 0524  NA 137  K 3.5  CL 105  CO2 25  GLUCOSE 208*  BUN 19  CREATININE 1.84*  CALCIUM  7.6*  PHOS 2.3*    Recent Labs  Lab 03/19/24 0347 03/20/24 0507 03/21/24 0524  WBC 14.1* 9.4 8.3  HGB 7.6* 7.6* 8.0*  HCT 23.5* 22.8* 24.7*  MCV 95.1 93.8 96.5  PLT 82* 66* 82*    Scheduled Meds:  ascorbic acid   500 mg Per Tube BID   atorvastatin   10 mg Per Tube Daily   busPIRone   5 mg Per Tube TID   collagenase    Topical Daily   darbepoetin (ARANESP ) injection - DIALYSIS  150 mcg Subcutaneous Q Tue-1800   diltiazem   30 mg Oral Q12H   famotidine   20 mg Per Tube QHS   feeding supplement  1 Container Oral Q24H   insulin  aspart  0-15 Units Subcutaneous Q4H   insulin  glargine  5 Units Subcutaneous QHS   midodrine   10 mg Per Tube Q8H   multivitamin  1 tablet Oral QHS   mouth rinse  15 mL Mouth Rinse 4 times per day   risperiDONE   1 mg Oral QHS   sodium chloride  flush  10-40 mL Intracatheter Q12H   thiamine   100 mg Per Tube Daily   vancomycin   125 mg Per Tube Daily   Or   vancomycin   125 mg Oral Daily   Continuous Infusions:  amiodarone  30 mg/hr (03/24/24 0037)   anticoagulant sodium citrate      meropenem  (MERREM ) IV 1 g (03/23/24 2216)   PRN Meds:.acetaminophen , alteplase , anticoagulant sodium citrate , diltiazem , HYDROmorphone  (DILAUDID ) injection, HYDROmorphone , hydrOXYzine , lidocaine  (PF), lidocaine -prilocaine , mouth rinse, oxyCODONE  **OR** [DISCONTINUED] oxyCODONE , pentafluoroprop-tetrafluoroeth, polyethylene glycol, senna   Mayli Covington, MD [facility] 03/24/2024, 9:29 AM

## 2024-03-25 ENCOUNTER — Inpatient Hospital Stay (HOSPITAL_COMMUNITY)

## 2024-03-25 DIAGNOSIS — R55 Syncope and collapse: Secondary | ICD-10-CM | POA: Diagnosis not present

## 2024-03-25 LAB — GLUCOSE, CAPILLARY
Glucose-Capillary: 147 mg/dL — ABNORMAL HIGH (ref 70–99)
Glucose-Capillary: 78 mg/dL (ref 70–99)
Glucose-Capillary: 80 mg/dL (ref 70–99)
Glucose-Capillary: 82 mg/dL (ref 70–99)
Glucose-Capillary: 86 mg/dL (ref 70–99)
Glucose-Capillary: 95 mg/dL (ref 70–99)

## 2024-03-25 MED ORDER — AMIODARONE HCL 200 MG PO TABS
400.0000 mg | ORAL_TABLET | Freq: Two times a day (BID) | ORAL | Status: DC
  Administered 2024-03-25 – 2024-03-28 (×8): 400 mg via ORAL
  Filled 2024-03-25 (×8): qty 2

## 2024-03-25 MED ORDER — AMIODARONE HCL 200 MG PO TABS: 200.0000 mg | ORAL_TABLET | Freq: Every day | ORAL | Status: DC

## 2024-03-25 NOTE — Progress Notes (Addendum)
 " Cardiology Progress Note  Patient ID: Deborah [name] MRN: [id number] DOB: [birth year] Date of Encounter: 03/25/2024 Primary Cardiologist: Wilbert Bihari, MD  Subjective   Chief Complaint: None.   HPI: BP soft. In NSR.   ROS:  All other ROS reviewed and negative. Pertinent positives noted in the HPI.     Telemetry  Overnight telemetry shows ST 100s, which I personally reviewed.   Physical Exam   Vitals:   03/24/24 2252 03/25/24 0014 03/25/24 0500 03/25/24 0700  BP: (!) 114/55 101/69  (!) 82/39  Pulse:  (!) 110    Resp:  15    Temp: 99.7 F (37.6 C)   98 F (36.7 C)  TempSrc: Axillary   Oral  SpO2:      Weight:   104.6 kg     Intake/Output Summary (Last 24 hours) at 03/25/2024 1040 Last data filed at 03/24/2024 1700 Gross per 24 hour  Intake 681.93 ml  Output -500 ml  Net 1181.93 ml       03/25/2024    5:00 AM 03/24/2024    9:26 AM 03/24/2024    9:24 AM  Last 3 Weights  Weight (lbs) 230 lb 9.6 oz 228 lb 13.4 oz 228 lb 13.4 oz  Weight (kg) 104.6 kg 103.8 kg 103.8 kg    Body mass index is 37.22 kg/m.  General: Well nourished, well developed, in no acute distress Head: Atraumatic, normal size  Eyes: PEERLA, EOMI  Neck: Supple, no JVD Endocrine: No thryomegaly Cardiac: Normal S1, S2; RRR; no murmurs, rubs, or gallops Lungs: Clear to auscultation bilaterally, no wheezing, rhonchi or rales  Abd: Soft, nontender, no hepatomegaly  Ext: No edema, pulses 2+ Musculoskeletal: R BKA Skin: Warm and dry, no rashes   Neuro: Alert and oriented to person, place, time, and situation, CNII-XII grossly intact, no focal deficits  Psych: Normal mood and affect   Cardiac Studies  TTE 03/18/2024  1. Left ventricular ejection fraction, by estimation, is 60 to 65%. The  left ventricle has normal function. There is mild concentric left  ventricular hypertrophy. Left ventricular diastolic parameters were  normal.   2. Basal and apical function preserved but unable to assess free  wall  function due to poor visualization. Right ventricular systolic function  was not well visualized. The right ventricular size is not well  visualized.   3. There is a small pericardial effusion located posteriorly (#21) . a  small pericardial effusion is present. The pericardial effusion is  posterior to the left ventricle.   4. The mitral valve is normal in structure. No evidence of mitral valve  regurgitation. No evidence of mitral stenosis.   5. Calcified nodule in left coronary cusp. The aortic valve is abnormal.  Aortic valve regurgitation is not visualized. No aortic stenosis is  present.   6. The inferior vena cava is dilated in size with <50% respiratory  variability, suggesting right atrial pressure of 15 mmHg.   Patient Profile  Deborah [name] is a 59 y.o. female with ESRD on hemodialysis, paroxysmal A-fib, hypertension, stroke, PAD status post right BKA admitted on 03/16/2024 for septic shock secondary to Klebsiella bacteremia and encephalopathy.  Cardiology consulted for A-fib with RVR.  Assessment & Plan   # A-fib with RVR - Limited options given chronic hypotension on hemodialysis.  He was placed on amiodarone  and has converted to sinus rhythm.  We will plan to transition to p.o. amiodarone .  400 mg twice daily for 7 days followed by 200 mg  daily.  Given limited options for medications would recommend rhythm control strategy moving forward.  Amiodarone  is the best option. - Not a candidate for anticoagulation.  Would hold any AV nodal agents given chronic hypotension.  She has chronic anemia and required recurrent transfusions.  I believe she is also had GI bleeding in the past.  For now hold anticoagulation.  # Sinus tachycardia - Now with sinus tachycardia likely driven by chronic hypotension.  No indication to treat this.  # ESRD on hemodialysis # Chronic hypotension - On midodrine .  She has sinus tachycardia which is secondary to her chronic hypotension.  Very  limited options.  Management per nephrology.  # Septic shock # Klebsiella bacteremia # Encephalopathy - Improved.  [facility] will sign off.   The patient is ready for discharge today from a cardiac standpoint. Medication Recommendations: Amiodarone  as above. Other recommendations (labs, testing, etc): None Follow up as an outpatient: 4 to 6 weeks with Dr. Shlomo  For questions or updates, please contact [facility] Please consult [email or web address] for contact info under        Signed, Darryle T. Barbaraann, MD, [facility] [place]  [facility]  03/25/2024 10:40 AM   "

## 2024-03-25 NOTE — Progress Notes (Signed)
 Patient's BP is low with MAP less than 65- paused Amio drip and notified provider- patient is awake and alert x 4-patient is currently eating

## 2024-03-25 NOTE — Plan of Care (Signed)

## 2024-03-25 NOTE — Progress Notes (Signed)
 " [facility] Progress Note   Subjective:   Reports she did not sleep well and is tired, otherwise no complaints. Denies SOB, CP, dizziness, nausea.   Objective Vitals:   03/24/24 2252 03/25/24 0014 03/25/24 0500 03/25/24 0700  BP: (!) 114/55 101/69  (!) 82/39  Pulse:  (!) 110    Resp:  15    Temp: 99.7 F (37.6 C)   98 F (36.7 C)  TempSrc: Axillary   Oral  SpO2:      Weight:   104.6 kg    Physical Exam General: alert female in NAD Heart: RRR, no murmur Lungs: CTA bilaterally, respirations unlabored on RA Abdomen: soft, non-distended, +BS Extremities: trace edema LLE Dialysis Access: R internal jugular [facility]  Additional Objective Labs: Basic Metabolic Panel: Recent Labs  Lab 03/20/24 1702 03/21/24 0524 03/24/24 0730 03/24/24 1618  NA 137 137 136 135  K 3.6 3.5 3.9 3.6  CL 104 105 103 101  CO2 24 25 23 24   GLUCOSE 269* 208* 87 139*  BUN 13 19 37* 19  CREATININE 1.45* 1.84* 4.11* 2.33*  CALCIUM  7.4* 7.6* 6.9* 7.1*  PHOS 2.4* 2.3*  --  2.9   Liver Function Tests: Recent Labs  Lab 03/18/24 1639 03/19/24 0347 03/19/24 1643 03/20/24 1702 03/21/24 0524 03/24/24 1618  AST  --  15  --   --   --   --   ALT  --  <5  --   --   --   --   ALKPHOS  --  75  --   --   --   --   BILITOT 0.7 0.7  --   --   --   --   PROT  --  4.4*  --   --   --   --   ALBUMIN   --  1.9*  1.9*   < > 2.2* 2.1* 1.8*   < > = values in this interval not displayed.   No results for input(s): LIPASE, AMYLASE in the last 168 hours. CBC: Recent Labs  Lab 03/18/24 1535 03/18/24 1639 03/19/24 0347 03/20/24 0507 03/21/24 0524 03/24/24 0730  WBC 13.1*  --  14.1* 9.4 8.3 16.8*  NEUTROABS  --   --   --   --   --  13.6*  HGB 6.6*   < > 7.6* 7.6* 8.0* 7.7*  HCT 20.5*   < > 23.5* 22.8* 24.7* 24.2*  MCV 99.0  --  95.1 93.8 96.5 98.0  PLT 93*   < > 82* 66* 82* 218   < > = values in this interval not displayed.   Blood Culture    Component Value Date/Time   SDES BLOOD  RIGHT ANTECUBITAL 03/16/2024 1949   SPECREQUEST  03/16/2024 1949    BOTTLES DRAWN AEROBIC AND ANAEROBIC Blood Culture results may not be optimal due to an inadequate volume of blood received in culture bottles   CULT (A) 03/16/2024 1949    KLEBSIELLA PNEUMONIAE Confirmed Extended Spectrum Beta-Lactamase Producer (ESBL).  In bloodstream infections from ESBL organisms, carbapenems are preferred over piperacillin /tazobactam. They are shown to have a lower risk of mortality.    REPTSTATUS 03/19/2024 FINAL 03/16/2024 1949    Cardiac Enzymes: No results for input(s): CKTOTAL, CKMB, CKMBINDEX, TROPONINI in the last 168 hours. CBG: Recent Labs  Lab 03/24/24 1459 03/24/24 2037 03/24/24 2359 03/25/24 0352 03/25/24 0738  GLUCAP 156* 128* 147* 82 80   Iron  Studies: No results for input(s): IRON , TIBC, TRANSFERRIN, FERRITIN  in the last 72 hours. @lablastinr3 @ Studies/Results: No results found. Medications:  amiodarone  30 mg/hr (03/25/24 0751)   meropenem  (MERREM ) IV 1 g (03/24/24 2124)    ascorbic acid   500 mg Oral BID   atorvastatin   10 mg Oral Daily   busPIRone   5 mg Oral TID   collagenase    Topical Daily   darbepoetin (ARANESP ) injection - DIALYSIS  150 mcg Subcutaneous Q Tue-1800   famotidine   20 mg Oral QHS   feeding supplement  1 Container Oral Q24H   insulin  aspart  0-15 Units Subcutaneous Q4H   insulin  glargine  5 Units Subcutaneous QHS   midodrine   10 mg Oral Q8H   multivitamin  1 tablet Oral QHS   mouth rinse  15 mL Mouth Rinse 4 times per day   risperiDONE   1 mg Oral QHS   sodium chloride  flush  10-40 mL Intracatheter Q12H   vancomycin   125 mg Per Tube Daily   Or   vancomycin   125 mg Oral Daily    D[facility] Orders:  [facility]  B400   96.1kg  TDC   Hep none Last OP HD 1/08, post wt 100kg  Assessment/Plan: ESRD - on HD TTS OP -s/p CRRT 1/10-1/13 given significant shock - HD back on TTS schedule    PAfib -cardiology following, on IV amio    Septic shock/ hypotension - s/p pressors - getting po midodrine  10 tid   Sepsis -secondary to klebsiella pn bacteremia -on meropenem  and vanc -also on po vanc -per primary service -repeat bcx--if not cleared then will need to attempt line holiday (has RIJ TDC)-------pending   Volume - UF as tolerated with midodrine  support   Anemia of esrd - follow, transfuse prn  -avoid IV Fe for now, started aranesp  1/13 -hgb 7.7   Diarrhea - getting empiric po vanc - had diarrhea and concern for Cdif, neg testing 1/10   Wounds -mgmt per primary service  Lucie Collet, PA-C 03/25/2024, 10:07 AM  [facility] Pager: [phone]   "

## 2024-03-25 NOTE — Plan of Care (Signed)
 Patient is progressing towards goals of care.     Problem: Education: Goal: Knowledge of General Education information will improve Description: Including pain rating scale, medication(s)/side effects and non-pharmacologic comfort measures Outcome: Progressing   Problem: Health Behavior/Discharge Planning: Goal: Ability to manage health-related needs will improve Outcome: Progressing   Problem: Clinical Measurements: Goal: Ability to maintain clinical measurements within normal limits will improve Outcome: Progressing Goal: Will remain free from infection Outcome: Progressing Goal: Diagnostic test results will improve Outcome: Progressing Goal: Respiratory complications will improve Outcome: Progressing Goal: Cardiovascular complication will be avoided Outcome: Progressing   Problem: Activity: Goal: Risk for activity intolerance will decrease Outcome: Progressing   Problem: Nutrition: Goal: Adequate nutrition will be maintained Outcome: Progressing   Problem: Coping: Goal: Level of anxiety will decrease Outcome: Progressing   Problem: Elimination: Goal: Will not experience complications related to bowel motility Outcome: Progressing Goal: Will not experience complications related to urinary retention Outcome: Progressing   Problem: Pain Managment: Goal: General experience of comfort will improve and/or be controlled Outcome: Progressing   Problem: Safety: Goal: Ability to remain free from injury will improve Outcome: Progressing   Problem: Skin Integrity: Goal: Risk for impaired skin integrity will decrease Outcome: Progressing   Problem: Education: Goal: Ability to describe self-care measures that may prevent or decrease complications (Diabetes Survival Skills Education) will improve Outcome: Progressing Goal: Individualized Educational Video(s) Outcome: Progressing   Problem: Coping: Goal: Ability to adjust to condition or change in health will  improve Outcome: Progressing   Problem: Fluid Volume: Goal: Ability to maintain a balanced intake and output will improve Outcome: Progressing   Problem: Health Behavior/Discharge Planning: Goal: Ability to identify and utilize available resources and services will improve Outcome: Progressing Goal: Ability to manage health-related needs will improve Outcome: Progressing   Problem: Metabolic: Goal: Ability to maintain appropriate glucose levels will improve Outcome: Progressing   Problem: Nutritional: Goal: Maintenance of adequate nutrition will improve Outcome: Progressing Goal: Progress toward achieving an optimal weight will improve Outcome: Progressing   Problem: Skin Integrity: Goal: Risk for impaired skin integrity will decrease Outcome: Progressing   Problem: Tissue Perfusion: Goal: Adequacy of tissue perfusion will improve Outcome: Progressing

## 2024-03-25 NOTE — Progress Notes (Signed)
" °   03/25/24 1200  Vitals  Temp 98.2 F (36.8 C)  Temp Source Oral  BP Location Left Wrist  BP Method Automatic  Patient Position (if appropriate) Lying  Pulse Rate Source Monitor  MEWS COLOR  MEWS Score Color Yellow  MEWS Score  MEWS Temp 0  MEWS Systolic 1  MEWS Pulse 1  MEWS RR 0  MEWS LOC 0  MEWS Score 2   Provider aware "

## 2024-03-25 NOTE — Progress Notes (Signed)
" °   03/25/24 0700  Vitals  Temp 98 F (36.7 C)  Temp Source Oral  BP (!) 82/39  MAP (mmHg) (!) 51  BP Location Left Wrist  BP Method Automatic  Patient Position (if appropriate) Lying  Pulse Rate Source Monitor  Level of Consciousness  Level of Consciousness Alert  MEWS COLOR  MEWS Score Color Yellow  Oxygen Therapy  O2 Device Room Air  Pain Assessment  Pain Scale 0-10  Pain Score 0  Glasgow Coma Scale  Eye Opening 4  Best Verbal Response (NON-intubated) 4  Best Motor Response 6  Glasgow Coma Scale Score 14  MEWS Score  MEWS Temp 0  MEWS Systolic 1  MEWS Pulse 1  MEWS RR 0  MEWS LOC 0  MEWS Score 2   Provider aware "

## 2024-03-25 NOTE — Progress Notes (Signed)
 "                                                                                                                                                                                                                                                                                PROGRESS NOTE     Patient Demographics:    Deborah [name], is a 59 y.o. female, DOB - May 22, 1965, FMW:[id number]  Outpatient Primary MD for the patient is Pcp, No    LOS - 9  Admit date - 03/16/2024    Chief Complaint  Patient presents with   Loss of Consciousness       Brief Narrative (HPI from H&P)   59 yo female with past medical history significant for PAF, HLD, right BKA, ESRD on HD (TTS) who presented from SNF to [id number] after syncopal episode with +LOC. Patient hemodynamically unstable in ED despite fluid resuscitation with 3L crystalloid and was subsequently started on vasopressors. Labs notable for metabolic acidosis, sCr 7.04, BUN 23, iCa 1.00, troponin 105, lactic acic 9.0, given 1 amp HCO3. On exam patient with diffuse abdominal pain, exam somewhat limited as she has chronic pain managed daily with Dilaudid . Patient started empirically on Cefepime , Vanc, Flagyl ; CT A/P pending, and syncope workup in progress. PCCM consulted for ICU admission.   Of note, patient recently hospitalized 02/14/2024-02/25/2024 for sepsis secondary to left thigh abscess and underwent I&D with gen surg 02/16/2024.  She was diagnosed with sepsis and subsequently Klebsiella bacteremia she was placed on IV meropenem , due to low blood pressures and underlying ESRD she was seen by nephrology and underwent multiple sessions of CRRT in ICU, she was also placed on vancomycin  prophylactically to prevent reoccurrence of C. difficile.  She was somewhat stabilized and transferred to my care on 03/22/2024 on day 6 of her hospital stay, currently blood pressure in 80s, still has NG tube, minimally responsive with generalized pain including abdominal  pain.   Significant Hospital Events: Including procedures, antibiotic start and stop dates in addition to other pertinent events   1/9 ED s/p witnessed syncopal event w/ +LOC in shock, started on Levo/Vaso; Aline/CVC placed 1/10.  CT abdomen pelvis in the ER.  Nonacute.  1/10 CRRT, still on vaso, NE. Esclated to meropenem  for ESBL Klebsiella 1/11 echocardiogram EF 60%.  No acute changes 1/12: off levophed , on vasopressin  1/13: off vasopressin , steroids, and CRRT 1/14: transfer out of ICU, TRH to pick up 1/15    Subjective:   Patient in bed, appears comfortable, denies any headache, no fever, no chest pain or pressure, no shortness of breath , no abdominal pain. No focal weakness.   Assessment  & Plan :    Septic shock due to Klebsiella pneumoniae bacteremia, Acute metabolic encephalopathy due to sepsis, improved, Chronic Hypotension   -Nephrology okay with keeping RIJ for now, CT abdomen pelvis negative, follow chest x-ray, on IV meropenem  with stop date of 03/31/2024, on prophylactic oral vancomycin  due to previous history of C. difficile with the same stop date.  Initially required Levophed  and stress dose steroids, sepsis pathophysiology has resolved.  Still on midodrine  but takes that at home as well.  Continue to monitor, so far source of infection likely wounds according to the ICU team.  Will check repeat blood cultures on 03/22/2024 and monitor.  Leukocytosis slightly worsened on 03/24/2024 this is noted, patient is afebrile, will repeat again tomorrow and monitor clinically.  Overall she is feeling better however continues to take high doses of narcotics and remains at risk for aspiration.  Has been counseled several times.    ESRD on HD (TTS) - nephrology on board initially required CRRT in ICU, defer HD to nephrology.  Noncompliant with treatments refusing dialysis despite counseling on 03/24/2024.  Chronic hypotension-takes midodrine  baseline continue, minimize narcotic intake.   Patient counseled.   Anemia of chronic disease  -drop in Hb may be dilutional. No evidence of DIC or bleeding, monitor   History of Cdiff  - cdiff negative,  on prophylactic dose PO vanc   Syncope; suspect due to sepsis  -echo: EF 60-605%, mild LVH, small pericardial effusion posteriorly    Troponin elevation; likely demand d/t shock state, PAF-not on anticoagulation due to past history of GI bleed, troponin elevation due to demand mismatch from sepsis, trend is flat and in non-ACS pattern, echo shows preserved EF and no wall motion abnormality.  Chest pain-free no further workup.  She developed RVR with hypotension for which she was seen by cardiology and started on amiodarone  drip on 03/23/2024.   HLD  - PTA statin   Drug-seeking behavior, Opioid-use disorder, Hx substance abuse-- as an OP following with [facility], minimize narcotics.  Chronic pain, Hx fibromyalgia, Acute on chronic abdominal pain, continue supportive care counseled to minimize narcotic intake as blood pressure at baseline is very low.  Continues to take high doses of narcotics despite counseling, aspiration risk remains very high.  Hx duodenal carcinoid tumor s/p resection (2022), Hx gastric ulcer & gastritis (2024) - Chronic gastroparesis -con't H2-blocker; stable.   S/p I&D for left thigh abscess 02/16/24.  Requested Dr. Harden to reevaluate on 03/22/2024.   Wounds present on admission-- left thigh, R hip, lower abdomen, buttocks, L heel. turns, WOC as needed Dr. Harden to evaluate left thigh postop site.  PVD s/p R BKA- stump is well healed   Sacral pressure injury - turns, unfortunately needs pressors, hard to keep that skin clear with frequent diarrhea   Possible vaginal yeast infection  -Clotrimazole  cream daily x7 days   Anxiety/Depression -Resume home buspar , risperidone , trazodone  when appropriate> waiting for mental status to normalize some   DM2; hypoglycemia from sepsis corrected with steroids -SSI  PRN -goal BG  140-180  Lab Results  Component Value Date   HGBA1C 4.3 (L) 02/15/2024   CBG (last 3)  Recent Labs    03/24/24 2359 03/25/24 0352 03/25/24 0738  GLUCAP 147* 82 80        Condition - Extremely Guarded  Family Communication  : None present  Code Status : Full code  Consults  : PCCM, nephrology, Dr. Harden  PUD Prophylaxis :    Procedures  :           Disposition Plan  :    Status is: Inpatient   DVT Prophylaxis  :    Place and maintain sequential compression device Start: 03/20/24 1800  Lab Results  Component Value Date   PLT 218 03/24/2024    Diet :  Diet Order             Diet regular Room service appropriate? Yes; Fluid consistency: Thin; Fluid restriction: 1800 mL Fluid  Diet effective now                    Inpatient Medications  Scheduled Meds:  ascorbic acid   500 mg Oral BID   atorvastatin   10 mg Oral Daily   busPIRone   5 mg Oral TID   collagenase    Topical Daily   darbepoetin (ARANESP ) injection - DIALYSIS  150 mcg Subcutaneous Q Tue-1800   famotidine   20 mg Oral QHS   feeding supplement  1 Container Oral Q24H   insulin  aspart  0-15 Units Subcutaneous Q4H   insulin  glargine  5 Units Subcutaneous QHS   midodrine   10 mg Oral Q8H   multivitamin  1 tablet Oral QHS   mouth rinse  15 mL Mouth Rinse 4 times per day   risperiDONE   1 mg Oral QHS   sodium chloride  flush  10-40 mL Intracatheter Q12H   thiamine   100 mg Per Tube Daily   vancomycin   125 mg Per Tube Daily   Or   vancomycin   125 mg Oral Daily   Continuous Infusions:  amiodarone  30 mg/hr (03/25/24 0751)   meropenem  (MERREM ) IV 1 g (03/24/24 2124)   PRN Meds:.acetaminophen , HYDROmorphone , hydrOXYzine , mouth rinse, oxyCODONE  **OR** [DISCONTINUED] oxyCODONE , polyethylene glycol, senna    Objective:   Vitals:   03/24/24 2252 03/25/24 0014 03/25/24 0500 03/25/24 0700  BP: (!) 114/55 101/69  (!) 82/39  Pulse:  (!) 110    Resp:  15    Temp: 99.7 F (37.6 C)    98 F (36.7 C)  TempSrc: Axillary   Oral  SpO2:      Weight:   104.6 kg     Wt Readings from Last 3 Encounters:  03/25/24 104.6 kg  02/25/24 97.2 kg  06/20/23 114.5 kg     Intake/Output Summary (Last 24 hours) at 03/25/2024 0913 Last data filed at 03/24/2024 1700 Gross per 24 hour  Intake 681.93 ml  Output -500 ml  Net 1181.93 ml     Physical Exam  Awake Alert, No new F.N deficits, right IJ HD catheter in place, NG tube in place [place].AT,PERRAL Supple Neck, No JVD,   Symmetrical Chest wall movement, Good air movement bilaterally, CTAB RRR,No Gallops,Rubs or new Murmurs,  +ve B.Sounds, Abd Soft, No tenderness,   No Cyanosis, Clubbing or edema     RN pressure injury documentation: Wound 02/19/24 0200 Pressure Injury Foot Left;Lateral Unstageable - Full thickness tissue loss in which the base of the injury is covered by slough (yellow, tan, gray, green or brown)  and/or eschar (tan, brown or black) in the wound bed. (Active)     Wound 03/17/24 0045 Pressure Injury Thigh Left;Posterior Stage 2 -  Partial thickness loss of dermis presenting as a shallow open injury with a red, pink wound bed without slough. (Active)     Wound 03/17/24 0045 Pressure Injury Hip Lateral;Right Stage 2 -  Partial thickness loss of dermis presenting as a shallow open injury with a red, pink wound bed without slough. (Active)     Wound 03/17/24 0045 Pressure Injury Abdomen Lower;Left Stage 2 -  Partial thickness loss of dermis presenting as a shallow open injury with a red, pink wound bed without slough. (Active)     Wound 03/17/24 1119 Pressure Injury Buttocks Right;Left;Medial Stage 3 -  Full thickness tissue loss. Subcutaneous fat may be visible but bone, tendon or muscle are NOT exposed. (Active)      Data Review:    Recent Labs  Lab 03/18/24 1535 03/18/24 1639 03/18/24 2130 03/19/24 0347 03/20/24 0507 03/21/24 0524 03/24/24 0730  WBC 13.1*  --   --  14.1* 9.4 8.3 16.8*  HGB 6.6*  --   7.7* 7.6* 7.6* 8.0* 7.7*  HCT 20.5*  --  24.1* 23.5* 22.8* 24.7* 24.2*  PLT 93* 94*  --  82* 66* 82* 218  MCV 99.0  --   --  95.1 93.8 96.5 98.0  MCH 31.9  --   --  30.8 31.3 31.3 31.2  MCHC 32.2  --   --  32.3 33.3 32.4 31.8  RDW 20.2*  --   --  21.0* 20.8* 20.3* 20.5*  LYMPHSABS  --   --   --   --   --   --  1.9  MONOABS  --   --   --   --   --   --  0.9  EOSABS  --   --   --   --   --   --  0.2  BASOSABS  --   --   --   --   --   --  0.0    Recent Labs  Lab 03/18/24 1639 03/19/24 0347 03/19/24 1643 03/20/24 0507 03/20/24 1702 03/21/24 0524 03/24/24 0730 03/24/24 1618  NA  --  136 134* 136 137 137 136 135  K  --  3.7 4.0 3.7 3.6 3.5 3.9 3.6  CL  --  99 100 103 104 105 103 101  CO2  --  23 22 27 24 25 23 24   ANIONGAP  --  15 12 7 9 8 10 10   GLUCOSE  --  138* 160* 182* 269* 208* 87 139*  BUN  --  11 10 10 13 19  37* 19  CREATININE  --  2.10* 1.55* 1.32* 1.45* 1.84* 4.11* 2.33*  AST  --  15  --   --   --   --   --   --   ALT  --  <5  --   --   --   --   --   --   ALKPHOS  --  75  --   --   --   --   --   --   BILITOT 0.7 0.7  --   --   --   --   --   --   ALBUMIN   --  1.9*  1.9* 1.8* 2.0* 2.2* 2.1*  --  1.8*  DDIMER 1.05*  --   --   --   --   --   --   --  INR 1.8*  --   --   --   --   --   --   --   MG  --  2.1  --  2.3  --  2.3  --  1.9  PHOS  --  2.4* 3.2 1.8* 2.4* 2.3*  --  2.9  CALCIUM   --  7.4* 7.5* 7.6* 7.4* 7.6* 6.9* 7.1*      Recent Labs  Lab 03/18/24 1639 03/19/24 0347 03/19/24 1643 03/20/24 0507 03/20/24 1702 03/21/24 0524 03/24/24 0730 03/24/24 1618  DDIMER 1.05*  --   --   --   --   --   --   --   INR 1.8*  --   --   --   --   --   --   --   MG  --  2.1  --  2.3  --  2.3  --  1.9  CALCIUM   --  7.4*   < > 7.6* 7.4* 7.6* 6.9* 7.1*   < > = values in this interval not displayed.    --------------------------------------------------------------------------------------------------------------- Lab Results  Component Value Date   CHOL 104  07/22/2019   HDL 30 (L) 07/22/2019   LDLCALC 37 07/22/2019   TRIG 185 (H) 07/22/2019   CHOLHDL 3.5 07/22/2019    Lab Results  Component Value Date   HGBA1C 4.3 (L) 02/15/2024   No results for input(s): TSH, T4TOTAL, FREET4, T3FREE, THYROIDAB in the last 72 hours. No results for input(s): VITAMINB12, FOLATE, FERRITIN, TIBC, IRON , RETICCTPCT in the last 72 hours. ------------------------------------------------------------------------------------------------------------------ Cardiac Enzymes No results for input(s): CKMB, TROPONINI, MYOGLOBIN in the last 168 hours.  Invalid input(s): CK  Radiology Report No results found.    Signature  -   Lavada Stank M.D on 03/25/2024 at 9:13 AM   -  To page go to [email or web address]   "

## 2024-03-26 DIAGNOSIS — R55 Syncope and collapse: Secondary | ICD-10-CM | POA: Diagnosis not present

## 2024-03-26 DIAGNOSIS — N186 End stage renal disease: Secondary | ICD-10-CM | POA: Diagnosis not present

## 2024-03-26 DIAGNOSIS — I9589 Other hypotension: Secondary | ICD-10-CM | POA: Diagnosis not present

## 2024-03-26 DIAGNOSIS — Z515 Encounter for palliative care: Secondary | ICD-10-CM | POA: Diagnosis not present

## 2024-03-26 DIAGNOSIS — G9340 Encephalopathy, unspecified: Secondary | ICD-10-CM | POA: Diagnosis not present

## 2024-03-26 LAB — GLUCOSE, CAPILLARY
Glucose-Capillary: 109 mg/dL — ABNORMAL HIGH (ref 70–99)
Glucose-Capillary: 152 mg/dL — ABNORMAL HIGH (ref 70–99)
Glucose-Capillary: 165 mg/dL — ABNORMAL HIGH (ref 70–99)
Glucose-Capillary: 218 mg/dL — ABNORMAL HIGH (ref 70–99)
Glucose-Capillary: 83 mg/dL (ref 70–99)
Glucose-Capillary: 97 mg/dL (ref 70–99)

## 2024-03-26 MED ORDER — MIDODRINE HCL 5 MG PO TABS
20.0000 mg | ORAL_TABLET | Freq: Three times a day (TID) | ORAL | Status: DC
  Administered 2024-03-26 – 2024-04-01 (×17): 20 mg via ORAL
  Filled 2024-03-26 (×17): qty 4

## 2024-03-26 MED ORDER — LACTATED RINGERS IV BOLUS
500.0000 mL | Freq: Once | INTRAVENOUS | Status: AC
  Administered 2024-03-26: 500 mL via INTRAVENOUS

## 2024-03-26 NOTE — Plan of Care (Signed)
  Problem: Clinical Measurements: Goal: Respiratory complications will improve Outcome: Progressing Goal: Cardiovascular complication will be avoided Outcome: Progressing   Problem: Coping: Goal: Level of anxiety will decrease Outcome: Progressing   Problem: Nutrition: Goal: Adequate nutrition will be maintained Outcome: Not Progressing   

## 2024-03-26 NOTE — Progress Notes (Signed)
 This chaplain responded to PMT PA-Josseline's consult for creating the Pt. Advance Directive-HCPOA. The chaplain reviewed the Pt. chart notes and received an update from PMT before the visit.  The Pt. is awake at the time of the visit. The Pt. shares she doesn't want her husband to be her medical decision maker and is interested in completing HCPOA. The Pt. is uncomfortable and notifies the RN of her pain.   The Pt. shared her choice of HCPOA as the Pt. niece-Javan. The chaplain started AD education and left an incomplete AD with the Pt. for review and F/U on Tuesday.  Chaplain Leeroy Hummer [phone]

## 2024-03-26 NOTE — TOC Progression Note (Signed)
 Transition of Care [facility]) - Progression Note    Patient Details  Name: Deborah [name] MRN: [id number] Date of Birth: 1965/05/20  Transition of Care [facility]) CM/SW Contact  Inocente GORMAN Kindle, LCSW Phone Number: 03/26/2024, 10:04 AM  Clinical Narrative:    CSW continuing to update [facility] so they can assess bed availability.    Expected Discharge Plan: Long Term Nursing Home Barriers to Discharge: Continued Medical Work up               Expected Discharge Plan and Services       Living arrangements for the past 2 months: Skilled Nursing Facility                                       Social Drivers of Health (SDOH) Interventions SDOH Screenings   Food Insecurity: No Food Insecurity (03/18/2024)  Housing: Low Risk (03/18/2024)  Transportation Needs: No Transportation Needs (03/18/2024)  Utilities: Not At Risk (03/18/2024)  Recent Concern: Utilities - At Risk (02/18/2024)  Depression (PHQ2-9): Medium Risk (03/16/2023)  Financial Resource Strain: Low Risk (03/23/2021)   Received from [facility])  Social Connections: Low Risk (03/23/2021)   Received from [facility])  Stress: Low Risk (03/23/2021)   Received from [facility])  Tobacco Use: Low Risk (03/18/2024)    Readmission Risk Interventions    02/20/2024   11:05 AM 03/07/2023    4:19 PM 11/25/2022    5:16 PM  Readmission Risk Prevention Plan  Transportation Screening Complete Complete Complete  Medication Review (RN Care Manager) Complete Complete Complete  PCP or Specialist appointment within 3-5 days of discharge Complete Complete Complete  HRI or Home Care Consult Complete Complete Complete  SW Recovery Care/Counseling Consult Complete Complete Complete  Palliative Care Screening Not Applicable Complete Not Applicable  Skilled Nursing Facility Complete Not Applicable Not Applicable

## 2024-03-26 NOTE — Progress Notes (Addendum)
 "                                                                                                                                                         Daily Progress Note   Patient Name: Deborah [name]       Date: 03/26/2024 DOB: 05-30-1965  Age: 59 y.o. MRN#: [id number] Attending Physician: Dennise Lavada POUR, MD Primary Care Physician: Pcp, No Admit Date: 03/16/2024  Reason for Consultation/Follow-up: Establishing goals of care  Subjective: Patient assessed at the bedside.  She is aroused and falls back asleep quickly, though she is able to answer yes/no questions and acknowledges that I met with her last week.  Unable to participate meaningfully in goals of care discussion today.  Her mother is present visiting.  Created space and opportunity for patient and family's thoughts and feelings on patient's current illness.  Patient's mother is very spiritual and praying for her improvement.  I provided counseling on the severity of patient's illness, reviewing the current treatment plan in detail and sharing concern for continued bacteremia despite initial improvement.  Reviewed next steps including blood cultures when able to obtain labs, continued monitoring of her low blood pressure.  I shared information regarding current eligibility for hospice if aligned with patient's goals of care.  The difference with palliative care and hospice was explained.  I provided her with update on my initial conversation with her daughter last week, at which time her goal was for all aggressive life-prolonging measures available to her.  Patient's mother he is grateful that patient's narcotics have been reduced.  She plans to speak with patient's facility to clarify medications, as she does not feel they are using the proper barrier creams for patient's sacral wound.  She also shares that she is going to be in contact with patient's niece, who she understands this to be HCPOA.  She requested a call for updates  from PMT as well as primary attending.  She seems to be having a hard time processing the severity of patient's illness after going a long time without receiving information from patient.  She then notified me that patient was sitting on her stool and they have been waiting for quite some time for nursing support.  Discussed with RN.  Questions and concerns addressed. PMT will continue to support holistically.  Objective:  Medical records reviewed including all progress notes from 1/19, cardiology nephrology and hospitalist notes from 1/18.  No new labs to review.  Physical Exam Vitals and nursing note reviewed.  Constitutional:      General: She is not in acute distress.    Appearance: She is ill-appearing.     Interventions: Nasal cannula in place.  HENT:     Head: Normocephalic and atraumatic.     Comments: Cortrak moved Cardiovascular:  Rate and Rhythm: Normal rate.  Pulmonary:     Effort: Pulmonary effort is normal. No respiratory distress.  Skin:    General: Skin is dry.  Neurological:     Mental Status: She is lethargic.            Vital Signs: BP (!) 77/55 (BP Location: Right Arm)   Pulse 100   Temp 98 F (36.7 C) (Oral)   Resp 11   Wt 105.9 kg   LMP 10/10/2012   SpO2 98%   BMI 37.68 kg/m  SpO2: SpO2: 98 % O2 Device: O2 Device: Room Air O2 Flow Rate: O2 Flow Rate (L/min): 3 L/min      Palliative Assessment/Data: TBD   Palliative Care Assessment & Plan   Patient Profile: 59 y.o. female  with past medical history of PAF, right BKA, end-stage renal disease on dialysis, chronic pain in her abdomen and bilateral legs admitted on 03/16/2024 with syncopal episode and loss of consciousness at [facility].    In the ED, patient was found to have metabolic acidosis and lactic acid of 9, SIRS criteria, ultimately admitted for septic shock requiring pressors.  She was also recently hospitalized in December 2025 for sepsis secondary to left thigh abscess.  Chart review also shows  that she was discharged from physical medicine and rehabilitation clinic for her pain management in January 2025 due to positive drug screen via oral swab.  Patient reports she is now seen by [facility] pain clinic.  Refused HD today due to pain.   PMT has been consulted to assist with goals of care conversation.  She was last seen by PMT for goals of care discussion in December 2024.  Assessment: Goals of care conversation Acute metabolic encephalopathy, waxing and waning End-stage renal disease on dialysis Chronic hypotension Chronic pain  Recommendations/Plan: Continue full code/full scope treatment per patient's previously stated wishes Patient is unable to participate in goals of care discussion today, though her mother is very hopeful for improvement and in agreement with current measures to prolong her life Appreciate spiritual care assistance with advance directives PMT will continue to follow and support   Prognosis: Poor  Discharge Planning: To Be Determined  Care plan was discussed with patient, patient's mother, RN   Facundo Allemand SHAUNNA Fell, PA-C  Palliative Medicine Team Team phone # [phone]  Thank you for allowing the Palliative Medicine Team to assist in the care of this patient. Please utilize secure chat with additional questions, if there is no response within 30 minutes please call the above phone number.  Palliative Medicine Team providers are available by phone from 7am to 7pm daily and can be reached through the team cell phone.  Should this patient require assistance outside of these hours, please call the patient's attending physician.   I personally spent a total of 50 minutes in the care of the patient today including preparing to see the patient, getting/reviewing separately obtained history, performing a medically appropriate exam/evaluation, counseling and educating, referring and communicating with other health care professionals, documenting clinical  information in the EHR, and coordinating care.  "

## 2024-03-26 NOTE — Progress Notes (Signed)
 "                                                                                                                                                                                                                                                                                PROGRESS NOTE     Patient Demographics:    Deborah [name], is a 59 y.o. female, DOB - [birth year], FMW:[id number]  Outpatient Primary MD for the patient is Pcp, No    LOS - 10  Admit date - 03/16/2024    Chief Complaint  Patient presents with   Loss of Consciousness       Brief Narrative (HPI from H&P)   59 yo female with past medical history significant for PAF, HLD, right BKA, ESRD on HD (TTS) who presented from SNF to [facility] after syncopal episode with +LOC. Patient hemodynamically unstable in ED despite fluid resuscitation with 3L crystalloid and was subsequently started on vasopressors. Labs notable for metabolic acidosis, sCr 7.04, BUN 23, iCa 1.00, troponin 105, lactic acic 9.0, given 1 amp HCO3. On exam patient with diffuse abdominal pain, exam somewhat limited as she has chronic pain managed daily with Dilaudid . Patient started empirically on Cefepime , Vanc, Flagyl ; CT A/P pending, and syncope workup in progress. PCCM consulted for ICU admission.   Of note, patient recently hospitalized 02/14/2024-02/25/2024 for sepsis secondary to left thigh abscess and underwent I&D with gen surg 02/16/2024.  She was diagnosed with sepsis and subsequently Klebsiella bacteremia she was placed on IV meropenem , due to low blood pressures and underlying ESRD she was seen by nephrology and underwent multiple sessions of CRRT in ICU, she was also placed on vancomycin  prophylactically to prevent reoccurrence of C. difficile.  She was somewhat stabilized and transferred to my care on 03/22/2024 on day 6 of her hospital stay, currently blood pressure in 80s, still has NG tube, minimally responsive with generalized pain including abdominal  pain.   Significant Hospital Events: Including procedures, antibiotic start and stop dates in addition to other pertinent events   1/9 ED s/p witnessed syncopal event w/ +LOC in shock, started on Levo/Vaso; Aline/CVC placed 1/10.  CT abdomen pelvis in the ER.  Nonacute.  1/10 CRRT, still on vaso, NE. Esclated to meropenem  for ESBL Klebsiella 1/11 echocardiogram EF 60%.  No acute changes 1/12: off levophed , on vasopressin  1/13: off vasopressin , steroids, and CRRT 1/14: transfer out of ICU, TRH to pick up 1/15    Subjective:   Patient in bed, appears comfortable, denies any headache, no fever, no chest pain or pressure, no shortness of breath , no abdominal pain. No new focal weakness.    Assessment  & Plan :    Septic shock due to Klebsiella pneumoniae bacteremia, Acute metabolic encephalopathy due to sepsis, improved, Chronic Hypotension   - Nephrology okay with keeping RIJ for now, CT abdomen pelvis negative, follow chest x-ray, on IV meropenem  with stop date of 03/31/2024, on prophylactic oral vancomycin  due to previous history of C. difficile with the same stop date.  Initially required Levophed  and stress dose steroids, sepsis pathophysiology has resolved.  Still on midodrine  but takes that at home as well.  Continue to monitor, so far source of infection likely wounds according to the ICU team.  Will check repeat blood cultures on 03/22/2024 and monitor.  Leukocytosis slightly worsened on 03/24/2024 this is noted, patient is afebrile, she is frequently refusing labs counseled, repeat CBC and blood cultures ordered again on 03/26/2024, will monitor.    Overall she is feeling better however continues to take high doses of narcotics and remains at risk for aspiration.  Has been counseled several times not to overuse narcotics.    ESRD on HD (TTS) - nephrology on board initially required CRRT in ICU, defer HD to nephrology.  Noncompliant with treatments refusing dialysis despite counseling  on 03/24/2024.  Chronic hypotension- takes midodrine  baseline continue, minimize narcotic intake.  Patient counseled.  Baseline systolic is around 85-90.   Drug-seeking behavior, Opioid-use disorder, Hx substance abuse-- as an OP following with [facility], minimize narcotics.  Chronic pain, Hx fibromyalgia, Acute on chronic abdominal pain, continue supportive care counseled to minimize narcotic intake as blood pressure at baseline is very low.  Continues to take high doses of narcotics despite counseling, aspiration risk remains very high.  Anemia of chronic disease  -drop in Hb may be dilutional. No evidence of DIC or bleeding, monitor   History of Cdiff  - cdiff negative,  on prophylactic dose PO vanc   Syncope; suspect due to sepsis  - echo: EF 60-605%, mild LVH, small pericardial effusion posteriorly    Troponin elevation; likely demand d/t shock state, PAF-not on anticoagulation due to past history of GI bleed, troponin elevation due to demand mismatch from sepsis, trend is flat and in non-ACS pattern, echo shows preserved EF and no wall motion abnormality.  Chest pain-free no further workup.  She developed RVR with hypotension for which she was seen by cardiology and started on amiodarone  drip on 03/23/2024.   HLD  - PTA statin   Hx duodenal carcinoid tumor s/p resection (2022), Hx gastric ulcer & gastritis (2024) - Chronic gastroparesis -con't H2-blocker; stable.   S/p I&D for left thigh abscess 02/16/24.  Requested Dr. Harden to reevaluate on 03/22/2024.   Wounds present on admission-- left thigh, R hip, lower abdomen, buttocks, L heel. turns, WOC as needed Dr. Harden to evaluate left thigh postop site.  PVD s/p R BKA- stump is well healed   Sacral pressure injury - turns, unfortunately needs pressors, hard to keep that skin clear with frequent diarrhea   Possible vaginal yeast infection  -Clotrimazole  cream daily x7 days   Anxiety/Depression -Resume  home buspar , risperidone ,  trazodone  when appropriate> waiting for mental status to normalize some   DM2; hypoglycemia from sepsis corrected with steroids -SSI PRN -goal BG 140-180  Lab Results  Component Value Date   HGBA1C 4.3 (L) 02/15/2024   CBG (last 3)  Recent Labs    03/26/24 0021 03/26/24 0405 03/26/24 0751  GLUCAP 109* 83 97        Condition - Extremely Guarded  Family Communication  : None present  Code Status : Full code  Consults  : PCCM, nephrology, Dr. Harden  PUD Prophylaxis :    Procedures  :           Disposition Plan  :    Status is: Inpatient   DVT Prophylaxis  :    Place and maintain sequential compression device Start: 03/20/24 1800  Lab Results  Component Value Date   PLT 218 03/24/2024    Diet :  Diet Order             Diet regular Room service appropriate? Yes; Fluid consistency: Thin; Fluid restriction: 1800 mL Fluid  Diet effective now                    Inpatient Medications  Scheduled Meds:  amiodarone   400 mg Oral BID   Followed by   NOREEN ON 04/01/2024] amiodarone   200 mg Oral Daily   ascorbic acid   500 mg Oral BID   atorvastatin   10 mg Oral Daily   busPIRone   5 mg Oral TID   collagenase    Topical Daily   darbepoetin (ARANESP ) injection - DIALYSIS  150 mcg Subcutaneous Q Tue-1800   famotidine   20 mg Oral QHS   feeding supplement  1 Container Oral Q24H   insulin  aspart  0-15 Units Subcutaneous Q4H   insulin  glargine  5 Units Subcutaneous QHS   midodrine   10 mg Oral Q8H   multivitamin  1 tablet Oral QHS   mouth rinse  15 mL Mouth Rinse 4 times per day   risperiDONE   1 mg Oral QHS   sodium chloride  flush  10-40 mL Intracatheter Q12H   vancomycin   125 mg Per Tube Daily   Or   vancomycin   125 mg Oral Daily   Continuous Infusions:  lactated ringers      meropenem  (MERREM ) IV 1 g (03/25/24 2255)   PRN Meds:.acetaminophen , HYDROmorphone , hydrOXYzine , mouth rinse, oxyCODONE  **OR** [DISCONTINUED] oxyCODONE , polyethylene glycol,  senna    Objective:   Vitals:   03/25/24 2018 03/26/24 0019 03/26/24 0500 03/26/24 0749  BP: 92/70 (!) 90/54  (!) 77/55  Pulse: 97   100  Resp: 11     Temp: 99.2 F (37.3 C) 99.3 F (37.4 C)  98 F (36.7 C)  TempSrc: Oral Oral  Oral  SpO2: 98%   98%  Weight:   105.9 kg     Wt Readings from Last 3 Encounters:  03/26/24 105.9 kg  02/25/24 97.2 kg  06/20/23 114.5 kg    No intake or output data in the 24 hours ending 03/26/24 0959    Physical Exam  Awake Alert, No new F.N deficits, right IJ HD catheter in place, NG tube in place [place].AT,PERRAL Supple Neck, No JVD,   Symmetrical Chest wall movement, Good air movement bilaterally, CTAB RRR,No Gallops,Rubs or new Murmurs,  +ve B.Sounds, Abd Soft, No tenderness,   No Cyanosis, Clubbing or edema     RN pressure injury documentation: Wound 02/19/24 0200 Pressure Injury Foot Left;Lateral Unstageable -  Full thickness tissue loss in which the base of the injury is covered by slough (yellow, tan, gray, green or brown) and/or eschar (tan, brown or black) in the wound bed. (Active)     Wound 03/17/24 0045 Pressure Injury Thigh Left;Posterior Stage 2 -  Partial thickness loss of dermis presenting as a shallow open injury with a red, pink wound bed without slough. (Active)     Wound 03/17/24 0045 Pressure Injury Hip Lateral;Right Stage 2 -  Partial thickness loss of dermis presenting as a shallow open injury with a red, pink wound bed without slough. (Active)     Wound 03/17/24 0045 Pressure Injury Abdomen Lower;Left Stage 2 -  Partial thickness loss of dermis presenting as a shallow open injury with a red, pink wound bed without slough. (Active)     Wound 03/17/24 1119 Pressure Injury Buttocks Right;Left;Medial Stage 3 -  Full thickness tissue loss. Subcutaneous fat may be visible but bone, tendon or muscle are NOT exposed. (Active)      Data Review:    Recent Labs  Lab 03/20/24 0507 03/21/24 0524 03/24/24 0730  WBC 9.4 8.3  16.8*  HGB 7.6* 8.0* 7.7*  HCT 22.8* 24.7* 24.2*  PLT 66* 82* 218  MCV 93.8 96.5 98.0  MCH 31.3 31.3 31.2  MCHC 33.3 32.4 31.8  RDW 20.8* 20.3* 20.5*  LYMPHSABS  --   --  1.9  MONOABS  --   --  0.9  EOSABS  --   --  0.2  BASOSABS  --   --  0.0    Recent Labs  Lab 03/19/24 1643 03/20/24 0507 03/20/24 1702 03/21/24 0524 03/24/24 0730 03/24/24 1618  NA 134* 136 137 137 136 135  K 4.0 3.7 3.6 3.5 3.9 3.6  CL 100 103 104 105 103 101  CO2 22 27 24 25 23 24   ANIONGAP 12 7 9 8 10 10   GLUCOSE 160* 182* 269* 208* 87 139*  BUN 10 10 13 19  37* 19  CREATININE 1.55* 1.32* 1.45* 1.84* 4.11* 2.33*  ALBUMIN  1.8* 2.0* 2.2* 2.1*  --  1.8*  MG  --  2.3  --  2.3  --  1.9  PHOS 3.2 1.8* 2.4* 2.3*  --  2.9  CALCIUM  7.5* 7.6* 7.4* 7.6* 6.9* 7.1*      Recent Labs  Lab 03/20/24 0507 03/20/24 1702 03/21/24 0524 03/24/24 0730 03/24/24 1618  MG 2.3  --  2.3  --  1.9  CALCIUM  7.6* 7.4* 7.6* 6.9* 7.1*    --------------------------------------------------------------------------------------------------------------- Lab Results  Component Value Date   CHOL 104 07/22/2019   HDL 30 (L) 07/22/2019   LDLCALC 37 07/22/2019   TRIG 185 (H) 07/22/2019   CHOLHDL 3.5 07/22/2019    Lab Results  Component Value Date   HGBA1C 4.3 (L) 02/15/2024   No results for input(s): TSH, T4TOTAL, FREET4, T3FREE, THYROIDAB in the last 72 hours. No results for input(s): VITAMINB12, FOLATE, FERRITIN, TIBC, IRON , RETICCTPCT in the last 72 hours. ------------------------------------------------------------------------------------------------------------------ Cardiac Enzymes No results for input(s): CKMB, TROPONINI, MYOGLOBIN in the last 168 hours.  Invalid input(s): CK  Radiology Report DG Chest Port 1 View Result Date: 03/25/2024 CLINICAL DATA:  [name]. EXAM: PORTABLE CHEST 1 VIEW COMPARISON:  Chest radiograph dated 03/22/2024. FINDINGS: Right-sided catheter in  similar position. Interval removal of the feeding tube. Bilateral interstitial densities similar to prior radiograph and may represent chronic changes, reactive airway disease, edema, or atypical pneumonia. No focal consolidation, pleural effusion, or pneumothorax. Stable cardiac  silhouette. No acute osseous pathology. IMPRESSION: No significant interval change. Electronically Signed   By: Vanetta Chou M.D.   On: 03/25/2024 16:34     Signature  -   Lavada Stank M.D on 03/26/2024 at 9:59 AM   -  To page go to [email or web address]   "

## 2024-03-26 NOTE — Progress Notes (Signed)
 " [facility] Progress Note   Subjective:    Seen and examined patient at bedside. She's responding but doesn't look well. Blood pressures remain low. On Midodrine  10mg  TID. She denies SOB, CP, and N/V. Next HD 1/20.  Objective Vitals:   03/26/24 0500 03/26/24 0749 03/26/24 0800 03/26/24 1118  BP:  (!) 77/55 100/62 101/84  Pulse:  100  100  Resp:      Temp:  98 F (36.7 C)  98.1 F (36.7 C)  TempSrc:  Oral  Oral  SpO2:  98%  98%  Weight: 105.9 kg      Physical Exam General: Alert female in NAD Heart: RRR, no murmur Lungs: CTA bilaterally, respirations unlabored on RA Abdomen: Soft, non-distended, +BS Extremities: Trace edema LLE Dialysis Access: R internal jugular [facility]  Filed Weights   03/24/24 0926 03/25/24 0500 03/26/24 0500  Weight: 103.8 kg 104.6 kg 105.9 kg   No intake or output data in the 24 hours ending 03/26/24 1348  Additional Objective Labs: Basic Metabolic Panel: Recent Labs  Lab 03/20/24 1702 03/21/24 0524 03/24/24 0730 03/24/24 1618  NA 137 137 136 135  K 3.6 3.5 3.9 3.6  CL 104 105 103 101  CO2 24 25 23 24   GLUCOSE 269* 208* 87 139*  BUN 13 19 37* 19  CREATININE 1.45* 1.84* 4.11* 2.33*  CALCIUM  7.4* 7.6* 6.9* 7.1*  PHOS 2.4* 2.3*  --  2.9   Liver Function Tests: Recent Labs  Lab 03/20/24 1702 03/21/24 0524 03/24/24 1618  ALBUMIN  2.2* 2.1* 1.8*   No results for input(s): LIPASE, AMYLASE in the last 168 hours. CBC: Recent Labs  Lab 03/20/24 0507 03/21/24 0524 03/24/24 0730  WBC 9.4 8.3 16.8*  NEUTROABS  --   --  13.6*  HGB 7.6* 8.0* 7.7*  HCT 22.8* 24.7* 24.2*  MCV 93.8 96.5 98.0  PLT 66* 82* 218   Blood Culture    Component Value Date/Time   SDES BLOOD RIGHT ANTECUBITAL 03/16/2024 1949   SPECREQUEST  03/16/2024 1949    BOTTLES DRAWN AEROBIC AND ANAEROBIC Blood Culture results may not be optimal due to an inadequate volume of blood received in culture bottles   CULT (A) 03/16/2024 1949    KLEBSIELLA  PNEUMONIAE Confirmed Extended Spectrum Beta-Lactamase Producer (ESBL).  In bloodstream infections from ESBL organisms, carbapenems are preferred over piperacillin /tazobactam. They are shown to have a lower risk of mortality.    REPTSTATUS 03/19/2024 FINAL 03/16/2024 1949    Cardiac Enzymes: No results for input(s): CKTOTAL, CKMB, CKMBINDEX, TROPONINI in the last 168 hours. CBG: Recent Labs  Lab 03/25/24 2020 03/26/24 0021 03/26/24 0405 03/26/24 0751 03/26/24 1122  GLUCAP 95 109* 83 97 218*   Iron  Studies: No results for input(s): IRON , TIBC, TRANSFERRIN, FERRITIN in the last 72 hours. Lab Results  Component Value Date   INR 1.8 (H) 03/18/2024   INR 1.9 (H) 03/17/2024   INR 1.5 (H) 03/22/2023   Studies/Results: DG Chest Port 1 View Result Date: 03/25/2024 CLINICAL DATA:  [name]. EXAM: PORTABLE CHEST 1 VIEW COMPARISON:  Chest radiograph dated 03/22/2024. FINDINGS: Right-sided catheter in similar position. Interval removal of the feeding tube. Bilateral interstitial densities similar to prior radiograph and may represent chronic changes, reactive airway disease, edema, or atypical pneumonia. No focal consolidation, pleural effusion, or pneumothorax. Stable cardiac silhouette. No acute osseous pathology. IMPRESSION: No significant interval change. Electronically Signed   By: Vanetta Chou M.D.   On: 03/25/2024 16:34    Medications:  meropenem  (MERREM ) IV 1 g (03/25/24 2255)    amiodarone   400 mg Oral BID   Followed by   NOREEN ON 04/01/2024] amiodarone   200 mg Oral Daily   ascorbic acid   500 mg Oral BID   atorvastatin   10 mg Oral Daily   busPIRone   5 mg Oral TID   collagenase    Topical Daily   darbepoetin (ARANESP ) injection - DIALYSIS  150 mcg Subcutaneous Q Tue-1800   famotidine   20 mg Oral QHS   feeding supplement  1 Container Oral Q24H   insulin  aspart  0-15 Units Subcutaneous Q4H   insulin  glargine  5 Units Subcutaneous QHS   midodrine   20  mg Oral Q8H   multivitamin  1 tablet Oral QHS   mouth rinse  15 mL Mouth Rinse 4 times per day   risperiDONE   1 mg Oral QHS   sodium chloride  flush  10-40 mL Intracatheter Q12H   vancomycin   125 mg Per Tube Daily   Or   vancomycin   125 mg Oral Daily    Dialysis Orders:  [facility]  B400   96.1kg  TDC   Hep none Last OP HD 1/08, post wt 100kg  Assessment/Plan: ESRD - on HD TTS OP -s/p CRRT 1/10-1/13 given significant shock - HD back on TTS schedule: Next HD 1/20   PAfib -cardiology following, on IV amio   Septic shock/ hypotension - s/p pressors - getting po midodrine  10 tid   Sepsis -secondary to klebsiella pn bacteremia -on meropenem  and vanc -also on po vanc -per primary service -repeat bcx--if not cleared then will need to attempt line holiday (has RIJ TDC)-------pending, cultures ordered today   Volume - UF as tolerated with midodrine  support -Bps remain low and I raised Midodrine  to 20mg  TID for BP support. Okay to give an extra 20mg  dose mid-run with HD if needed. Will also order IV Alb with HD tomorrow   Anemia of esrd - follow, transfuse prn  -avoid IV Fe for now, started aranesp  1/13 -hgb 7.7   Diarrhea - getting empiric po vanc - had diarrhea and concern for Cdif, neg testing 1/10   Wounds -mgmt per primary service  Dispo -Inpatient -She doesn't look good -I recommended palliative care services   [name], NP [facility] 03/26/2024,1:48 PM  LOS: 10 days    "

## 2024-03-26 NOTE — Progress Notes (Signed)
 SPIRITUAL CARE AND COUNSELING CONSULT NOTE   VISIT SUMMARY Chaplain provided AD education to patient. Made Chaplain availability known. Patient encouraged to contact nurse to answer questions and complete notarization with witnesses.   SPIRITUAL ENCOUNTER                                                                                                                                                                      Type of Visit: Initial Care provided to:: Patient Conversation partners present during encounter: Nurse Referral source: Nurse (RN/NT/LPN) Reason for visit: Advance directives OnCall Visit: No   SPIRITUAL FRAMEWORK      GOALS       INTERVENTIONS        INTERVENTION OUTCOMES      SPIRITUAL CARE PLAN        If immediate needs arise, please contact [facility] 24 hour on call [phone]   Roxanne Slade  03/26/2024 3:45 PM

## 2024-03-26 NOTE — Progress Notes (Signed)
 Phlebotomy unable to obtain labs, pt refusing to be stuck again and requesting labs to be obtained in dialysis tomorrow. Made Dr. Dennise aware of pt request and no new orders obtained at this time.

## 2024-03-27 DIAGNOSIS — G9340 Encephalopathy, unspecified: Secondary | ICD-10-CM | POA: Diagnosis not present

## 2024-03-27 DIAGNOSIS — I9589 Other hypotension: Secondary | ICD-10-CM | POA: Diagnosis not present

## 2024-03-27 DIAGNOSIS — R55 Syncope and collapse: Secondary | ICD-10-CM | POA: Diagnosis not present

## 2024-03-27 DIAGNOSIS — N186 End stage renal disease: Secondary | ICD-10-CM | POA: Diagnosis not present

## 2024-03-27 DIAGNOSIS — Z515 Encounter for palliative care: Secondary | ICD-10-CM | POA: Diagnosis not present

## 2024-03-27 LAB — CBC WITH DIFFERENTIAL/PLATELET
Abs Immature Granulocytes: 0.19 K/uL — ABNORMAL HIGH (ref 0.00–0.07)
Basophils Absolute: 0.1 K/uL (ref 0.0–0.1)
Basophils Relative: 1 %
Eosinophils Absolute: 0.2 K/uL (ref 0.0–0.5)
Eosinophils Relative: 1 %
HCT: 24.9 % — ABNORMAL LOW (ref 36.0–46.0)
Hemoglobin: 8.1 g/dL — ABNORMAL LOW (ref 12.0–15.0)
Immature Granulocytes: 1 %
Lymphocytes Relative: 9 %
Lymphs Abs: 2 K/uL (ref 0.7–4.0)
MCH: 31.8 pg (ref 26.0–34.0)
MCHC: 32.5 g/dL (ref 30.0–36.0)
MCV: 97.6 fL (ref 80.0–100.0)
Monocytes Absolute: 1.7 K/uL — ABNORMAL HIGH (ref 0.1–1.0)
Monocytes Relative: 7 %
Neutro Abs: 19.1 K/uL — ABNORMAL HIGH (ref 1.7–7.7)
Neutrophils Relative %: 81 %
Platelets: 370 K/uL (ref 150–400)
RBC: 2.55 MIL/uL — ABNORMAL LOW (ref 3.87–5.11)
RDW: 19.8 % — ABNORMAL HIGH (ref 11.5–15.5)
WBC: 23.2 K/uL — ABNORMAL HIGH (ref 4.0–10.5)
nRBC: 0 % (ref 0.0–0.2)

## 2024-03-27 LAB — RENAL FUNCTION PANEL
Albumin: 1.7 g/dL — ABNORMAL LOW (ref 3.5–5.0)
Anion gap: 10 (ref 5–15)
BUN: 30 mg/dL — ABNORMAL HIGH (ref 6–20)
CO2: 24 mmol/L (ref 22–32)
Calcium: 7.1 mg/dL — ABNORMAL LOW (ref 8.9–10.3)
Chloride: 105 mmol/L (ref 98–111)
Creatinine, Ser: 4.33 mg/dL — ABNORMAL HIGH (ref 0.44–1.00)
GFR, Estimated: 11 mL/min — ABNORMAL LOW
Glucose, Bld: 162 mg/dL — ABNORMAL HIGH (ref 70–99)
Phosphorus: 4.5 mg/dL (ref 2.5–4.6)
Potassium: 3.7 mmol/L (ref 3.5–5.1)
Sodium: 139 mmol/L (ref 135–145)

## 2024-03-27 LAB — GLUCOSE, CAPILLARY
Glucose-Capillary: 124 mg/dL — ABNORMAL HIGH (ref 70–99)
Glucose-Capillary: 136 mg/dL — ABNORMAL HIGH (ref 70–99)
Glucose-Capillary: 138 mg/dL — ABNORMAL HIGH (ref 70–99)
Glucose-Capillary: 145 mg/dL — ABNORMAL HIGH (ref 70–99)
Glucose-Capillary: 190 mg/dL — ABNORMAL HIGH (ref 70–99)
Glucose-Capillary: 223 mg/dL — ABNORMAL HIGH (ref 70–99)

## 2024-03-27 LAB — MAGNESIUM: Magnesium: 2.1 mg/dL (ref 1.7–2.4)

## 2024-03-27 MED ORDER — PENTAFLUOROPROP-TETRAFLUOROETH EX AERO: 1.0000 | INHALATION_SPRAY | CUTANEOUS | Status: DC | PRN

## 2024-03-27 MED ORDER — LACTATED RINGERS IV BOLUS
250.0000 mL | Freq: Once | INTRAVENOUS | Status: AC
  Administered 2024-03-27: 250 mL via INTRAVENOUS

## 2024-03-27 MED ORDER — LIDOCAINE HCL (PF) 1 % IJ SOLN: 5.0000 mL | INTRAMUSCULAR | Status: DC | PRN

## 2024-03-27 MED ORDER — ALTEPLASE 2 MG IJ SOLR: 2.0000 mg | Freq: Once | INTRAMUSCULAR | Status: DC | PRN

## 2024-03-27 MED ORDER — HEPARIN SODIUM (PORCINE) 1000 UNIT/ML IJ SOLN
INTRAMUSCULAR | Status: AC
  Filled 2024-03-27: qty 4

## 2024-03-27 MED ORDER — LIDOCAINE-PRILOCAINE 2.5-2.5 % EX CREA: 1.0000 | TOPICAL_CREAM | CUTANEOUS | Status: DC | PRN

## 2024-03-27 MED ORDER — HEPARIN SODIUM (PORCINE) 1000 UNIT/ML DIALYSIS
1000.0000 [IU] | INTRAMUSCULAR | Status: DC | PRN
  Administered 2024-03-27: 3800 [IU] via INTRAVENOUS_CENTRAL

## 2024-03-27 NOTE — Plan of Care (Signed)

## 2024-03-27 NOTE — Progress Notes (Signed)
 "                                                                                                                                                         Daily Progress Note   Patient Name: Deborah [name]       Date: 03/27/2024 DOB: 05/25/1965  Age: 59 y.o. MRN#: [id number] Attending Physician: Dennise Lavada POUR, MD Primary Care Physician: Pcp, No Admit Date: 03/16/2024  Reason for Consultation/Follow-up: Establishing goals of care  Subjective: Patient recalls our first conversation, still wondering if I could bring her belated birthday hot chocolate drink which I did.  She remains agreeable to all life-prolonging measures.  She says she has a lot on her mind, especially concern for her narcotics causing her harm after discussion with primary attending. Counseling and education provided on the risks and benefits of cautious opioid use. Confirmed with RN that she already received upon arrival to HD. She remains in pain, unfortunately.  She is agreeable to a phone call to update her mother per mother's request yesterday.  I discussed her current care plan with her and concern for infection from her wounds.  She is hopeful that this is not the case.  Created space and opportunity for patient and family's thoughts and feelings on patient's current illness.  After my phone call with patient's mother, she is appreciative of the update and requested to speak with patient's social worker.  No other needs at this time.  Questions and concerns addressed. PMT will continue to support holistically.  Objective:  Medical records reviewed including all progress notes from 1/20.  Patient agreed to dialysis after refusing earlier today. Labs worrisome for worsening leukocytosis.  Creatinine elevated as expected prior to dialysis.  Physical Exam Vitals and nursing note reviewed.  Constitutional:      General: She is not in acute distress.    Appearance: She is ill-appearing.     Interventions: Nasal  cannula in place.  HENT:     Head: Normocephalic and atraumatic.     Comments: Cortrak moved Cardiovascular:     Rate and Rhythm: Normal rate.  Pulmonary:     Effort: Pulmonary effort is normal. No respiratory distress.  Skin:    General: Skin is dry.  Neurological:     Mental Status: She is lethargic.            Vital Signs: BP 92/60 (BP Location: Right Wrist)   Pulse (!) 104   Temp 97.9 F (36.6 C) (Axillary)   Resp 16   Wt 105.6 kg   LMP 10/10/2012   SpO2 98%   BMI 37.58 kg/m  SpO2: SpO2: 98 % O2 Device: O2 Device: Room Air O2 Flow Rate: O2 Flow Rate (L/min): 3 L/min      Palliative Assessment/Data: TBD   Palliative  Care Assessment & Plan   Patient Profile: 59 y.o. female  with past medical history of PAF, right BKA, end-stage renal disease on dialysis, chronic pain in her abdomen and bilateral legs admitted on 03/16/2024 with syncopal episode and loss of consciousness at [facility].    In the ED, patient was found to have metabolic acidosis and lactic acid of 9, SIRS criteria, ultimately admitted for septic shock requiring pressors.  She was also recently hospitalized in December 2025 for sepsis secondary to left thigh abscess.  Chart review also shows that she was discharged from physical medicine and rehabilitation clinic for her pain management in January 2025 due to positive drug screen via oral swab.  Patient reports she is now seen by [facility] pain clinic.  Refused HD today due to pain.   PMT has been consulted to assist with goals of care conversation.  She was last seen by PMT for goals of care discussion in December 2024.  Assessment: Goals of care conversation Acute metabolic encephalopathy, waxing and waning End-stage renal disease on dialysis Chronic hypotension Chronic pain  Recommendations/Plan: Continue full code/full scope treatment  Patient remains agreeable to all available life-prolonging measures Ongoing goals of care discussions and palliative  support   Prognosis: Poor  Discharge Planning: To Be Determined  Care plan was discussed with patient, patient's mother, HD RN, LCSW   Kwan Shellhammer SHAUNNA Fell, PA-C  Palliative Medicine Team Team phone # [phone]  Thank you for allowing the Palliative Medicine Team to assist in the care of this patient. Please utilize secure chat with additional questions, if there is no response within 30 minutes please call the above phone number.  Palliative Medicine Team providers are available by phone from 7am to 7pm daily and can be reached through the team cell phone.  Should this patient require assistance outside of these hours, please call the patient's attending physician.    I personally spent a total of 40 minutes in the care of the patient today including preparing to see the patient, getting/reviewing separately obtained history, performing a medically appropriate exam/evaluation, counseling and educating, referring and communicating with other health care professionals, documenting clinical information in the EHR, and communicating results.  "

## 2024-03-27 NOTE — Progress Notes (Signed)
 Pt. Came in awake and oriented with no complaints Consent verified and on file. Started with no complaints   UF removal: Tx duration: 3.5 hours  Access used: Right IJ Access issue: None   Tx completed and tolerated Catheter locked and  dwelled with heparin . Endorsed to floor nurse Transported to room.  Shakur Lembo Rubi Billal Rollo, RN [facility]

## 2024-03-27 NOTE — Progress Notes (Signed)
 " [facility] Progress Note   Subjective:    Seen and examined patient on HD. Having difficulty reaching max UF 2nd low Bps. BP now is 99/65 and UFG set . Currently denies SOB, CP, and N/V.   Objective Vitals:   03/27/24 1309 03/27/24 1322 03/27/24 1330 03/27/24 1445  BP: (!) 92/55 96/61 (!) 98/57 113/89  Pulse: 94 90 89 (!) 141  Resp: 11 10 13 13   Temp: 98.7 F (37.1 C)     TempSrc:      SpO2: 100% 100% 100% 100%  Weight: 101.5 kg      Physical Exam General: Ill-appearing female; awake, alert, NAD Heart: RRR, no murmur Lungs: Clear anteriorly, respirations unlabored on RA Abdomen: Soft, non-distended, +BS Extremities: Trace edema LLE Dialysis Access: R internal jugular TDC  Filed Weights   03/26/24 0500 03/27/24 0500 03/27/24 1309  Weight: 105.9 kg 105.6 kg 101.5 kg    Intake/Output Summary (Last 24 hours) at 03/27/2024 1552 Last data filed at 03/27/2024 0830 Gross per 24 hour  Intake 120 ml  Output --  Net 120 ml    Additional Objective Labs: Basic Metabolic Panel: Recent Labs  Lab 03/21/24 0524 03/24/24 0730 03/24/24 1618 03/27/24 1327  NA 137 136 135 139  K 3.5 3.9 3.6 3.7  CL 105 103 101 105  CO2 25 23 24 24   GLUCOSE 208* 87 139* 162*  BUN 19 37* 19 30*  CREATININE 1.84* 4.11* 2.33* 4.33*  CALCIUM  7.6* 6.9* 7.1* 7.1*  PHOS 2.3*  --  2.9 4.5   Liver Function Tests: Recent Labs  Lab 03/21/24 0524 03/24/24 1618 03/27/24 1327  ALBUMIN  2.1* 1.8* 1.7*   No results for input(s): LIPASE, AMYLASE in the last 168 hours. CBC: Recent Labs  Lab 03/21/24 0524 03/24/24 0730 03/27/24 1328  WBC 8.3 16.8* 23.2*  NEUTROABS  --  13.6* 19.1*  HGB 8.0* 7.7* 8.1*  HCT 24.7* 24.2* 24.9*  MCV 96.5 98.0 97.6  PLT 82* 218 370   Blood Culture    Component Value Date/Time   SDES BLOOD RIGHT ANTECUBITAL 03/16/2024 1949   SPECREQUEST  03/16/2024 1949    BOTTLES DRAWN AEROBIC AND ANAEROBIC Blood Culture results may not be optimal due to  an inadequate volume of blood received in culture bottles   CULT (A) 03/16/2024 1949    KLEBSIELLA PNEUMONIAE Confirmed Extended Spectrum Beta-Lactamase Producer (ESBL).  In bloodstream infections from ESBL organisms, carbapenems are preferred over piperacillin /tazobactam. They are shown to have a lower risk of mortality.    REPTSTATUS 03/19/2024 FINAL 03/16/2024 1949    Cardiac Enzymes: No results for input(s): CKTOTAL, CKMB, CKMBINDEX, TROPONINI in the last 168 hours. CBG: Recent Labs  Lab 03/26/24 2012 03/27/24 0046 03/27/24 0313 03/27/24 0758 03/27/24 1206  GLUCAP 152* 124* 138* 136* 190*   Iron  Studies: No results for input(s): IRON , TIBC, TRANSFERRIN, FERRITIN in the last 72 hours. Lab Results  Component Value Date   INR 1.8 (H) 03/18/2024   INR 1.9 (H) 03/17/2024   INR 1.5 (H) 03/22/2023   Studies/Results: No results found.  Medications:  meropenem  (MERREM ) IV 1 g (03/26/24 2226)    amiodarone   400 mg Oral BID   Followed by   NOREEN ON 04/01/2024] amiodarone   200 mg Oral Daily   ascorbic acid   500 mg Oral BID   atorvastatin   10 mg Oral Daily   busPIRone   5 mg Oral TID   collagenase    Topical Daily   darbepoetin (ARANESP ) injection - DIALYSIS  150 mcg Subcutaneous Q Tue-1800   famotidine   20 mg Oral QHS   feeding supplement  1 Container Oral Q24H   insulin  aspart  0-15 Units Subcutaneous Q4H   insulin  glargine  5 Units Subcutaneous QHS   midodrine   20 mg Oral Q8H   multivitamin  1 tablet Oral QHS   mouth rinse  15 mL Mouth Rinse 4 times per day   risperiDONE   1 mg Oral QHS   sodium chloride  flush  10-40 mL Intracatheter Q12H   vancomycin   125 mg Per Tube Daily   Or   vancomycin   125 mg Oral Daily    Dialysis Orders: [facility]  B400   96.1kg  TDC   Hep none Last OP HD 1/08, post wt 100kg  Assessment/Plan:  ESRD - on HD TTS OP -s/p CRRT 1/10-1/13 given significant shock - HD back on TTS schedule: On HD   PAfib -cardiology  following, on IV amio   Septic shock/ hypotension - s/p pressors - getting po midodrine , see below   Sepsis -secondary to klebsiella pn bacteremia -on meropenem  and vanc -also on po vanc -per primary service -repeat bcx--if not cleared then will need to attempt line holiday (has RIJ TDC)-------pending, cultures ordered today   Volume - UF as tolerated with midodrine  support -Bps remain low and I raised Midodrine  to 20mg  TID for BP support. Okay to give an extra 20mg  dose mid-run with HD if needed. Will also order IV Alb with HD today   Anemia of esrd - follow, transfuse prn  -avoid IV Fe for now, started aranesp  1/13 -hgb 8.1   Diarrhea - getting empiric po vanc - had diarrhea and concern for Cdif, neg testing 1/10   Wounds -mgmt per primary service   Dispo/GOC -Inpatient -She doesn't look good -Palliative care has seen this patient multiple times. Per today's note, patient wishes to continue full scope of care. Appreciate Palliative's assistance   ADDENDUM 03/27/24 at 4:07pm - Informed by HD RN of patient wanted to sign-off HD early. I discussed with patient on the importance of staying on treatment full time. KYM Piety, NP  [name], NP [facility] 03/27/2024,3:52 PM  LOS: 11 days    "

## 2024-03-27 NOTE — Progress Notes (Signed)
 "                                                                                                                                                                                                                                                                                PROGRESS NOTE     Patient Demographics:    Deborah [name], is a 59 y.o. female, DOB - 05-21-1965, FMW:[id number]  Outpatient Primary MD for the patient is Pcp, No    LOS - 11  Admit date - 03/16/2024    Chief Complaint  Patient presents with   Loss of Consciousness       Brief Narrative (HPI from H&P)   59 yo female with past medical history significant for PAF, HLD, right BKA, ESRD on HD (TTS) who presented from SNF to [facility] after syncopal episode with +LOC. Patient hemodynamically unstable in ED despite fluid resuscitation with 3L crystalloid and was subsequently started on vasopressors. Labs notable for metabolic acidosis, sCr 7.04, BUN 23, iCa 1.00, troponin 105, lactic acic 9.0, given 1 amp HCO3. On exam patient with diffuse abdominal pain, exam somewhat limited as she has chronic pain managed daily with Dilaudid . Patient started empirically on Cefepime , Vanc, Flagyl ; CT A/P pending, and syncope workup in progress. PCCM consulted for ICU admission.   Of note, patient recently hospitalized 02/14/2024-02/25/2024 for sepsis secondary to left thigh abscess and underwent I&D with gen surg 02/16/2024.  She was diagnosed with sepsis and subsequently Klebsiella bacteremia she was placed on IV meropenem , due to low blood pressures and underlying ESRD she was seen by nephrology and underwent multiple sessions of CRRT in ICU, she was also placed on vancomycin  prophylactically to prevent reoccurrence of C. difficile.  She was somewhat stabilized and transferred to my care on 03/22/2024 on day 6 of her hospital stay, currently blood pressure in 80s, still has NG tube, minimally responsive with generalized pain including abdominal  pain.   Significant Hospital Events: Including procedures, antibiotic start and stop dates in addition to other pertinent events   1/9 ED s/p witnessed syncopal event w/ +LOC in shock, started on Levo/Vaso; Aline/CVC placed 1/10.  CT abdomen pelvis in the ER.  Nonacute.  1/10 CRRT, still on vaso, NE. Esclated to meropenem  for ESBL Klebsiella 1/11 echocardiogram EF 60%.  No acute changes 1/12: off levophed , on vasopressin  1/13: off vasopressin , steroids, and CRRT 1/14: transfer out of ICU, TRH to pick up 1/15    Subjective:   Patient in bed, appears comfortable, denies any headache, no fever, no chest pain or pressure, no shortness of breath , no abdominal pain. No new focal weakness.    Assessment  & Plan :    Septic shock due to Klebsiella pneumoniae bacteremia, Acute metabolic encephalopathy due to sepsis, improved, Chronic Hypotension   - Nephrology okay with keeping RIJ for now, CT abdomen pelvis negative, follow chest x-ray, on IV meropenem  with stop date of 03/31/2024, on prophylactic oral vancomycin  due to previous history of C. difficile with the same stop date.  Initially required Levophed  and stress dose steroids, sepsis pathophysiology has resolved.  Still on midodrine  but takes that at home as well.  Continue to monitor, so far source of infection likely wounds according to the ICU team.  Will check repeat blood cultures on 03/22/2024 and monitor.  Leukocytosis slightly worsened on 03/24/2024 this is noted, patient is afebrile, she is frequently refusing labs counseled, repeat CBC and blood cultures ordered again on 03/26/2024, will monitor.    Overall she is feeling better however continues to take high doses of narcotics and remains at risk for aspiration.  Has been counseled several times not to overuse narcotics.    ESRD on HD (TTS) - nephrology on board initially required CRRT in ICU, defer HD to nephrology.  Noncompliant with treatments refusing dialysis despite counseling  on 03/24/2024.  Chronic hypotension- takes midodrine  baseline continue, minimize narcotic intake.  Patient counseled.  Baseline systolic is around 85-90.   Drug-seeking behavior, Opioid-use disorder, Hx substance abuse-- as an OP following with [facility], minimize narcotics.  Chronic pain, Hx fibromyalgia, Acute on chronic abdominal pain, continue supportive care counseled to minimize narcotic intake as blood pressure at baseline is very low.  Continues to take high doses of narcotics despite counseling, aspiration risk remains very high.  Anemia of chronic disease  -drop in Hb may be dilutional. No evidence of DIC or bleeding, monitor   History of Cdiff  - cdiff negative,  on prophylactic dose PO vanc   Syncope; suspect due to sepsis  - echo: EF 60-605%, mild LVH, small pericardial effusion posteriorly    Troponin elevation; likely demand d/t shock state, PAF-not on anticoagulation due to past history of GI bleed, troponin elevation due to demand mismatch from sepsis, trend is flat and in non-ACS pattern, echo shows preserved EF and no wall motion abnormality.  Chest pain-free no further workup.  She developed RVR with hypotension for which she was seen by cardiology and started on amiodarone  drip on 03/23/2024.   HLD  - PTA statin   Hx duodenal carcinoid tumor s/p resection (2022), Hx gastric ulcer & gastritis (2024) - Chronic gastroparesis -con't H2-blocker; stable.   S/p I&D for left thigh abscess 02/16/24.  Requested Dr. Harden to reevaluate on 03/22/2024.   Wounds present on admission-- left thigh, R hip, lower abdomen, buttocks, L heel. turns, WOC as needed Dr. Harden to evaluate left thigh postop site.  PVD s/p R BKA- stump is well healed   Sacral pressure injury - turns, unfortunately needs pressors, hard to keep that skin clear with frequent diarrhea   Possible vaginal yeast infection  -Clotrimazole  cream daily x7 days   Anxiety/Depression -Resume  home buspar , risperidone ,  trazodone  when appropriate> waiting for mental status to normalize some   DM2; hypoglycemia from sepsis corrected with steroids -SSI PRN -goal BG 140-180  Lab Results  Component Value Date   HGBA1C 4.3 (L) 02/15/2024   CBG (last 3)  Recent Labs    03/27/24 0046 03/27/24 0313 03/27/24 0758  GLUCAP 124* 138* 136*        Condition - Extremely Guarded  Family Communication  : None present  Code Status : Full code  Consults  : PCCM, nephrology, Dr. Harden  PUD Prophylaxis :    Procedures  :           Disposition Plan  :    Status is: Inpatient   DVT Prophylaxis  :    Place and maintain sequential compression device Start: 03/20/24 1800  Lab Results  Component Value Date   PLT 218 03/24/2024    Diet :  Diet Order             Diet regular Room service appropriate? Yes; Fluid consistency: Thin; Fluid restriction: 1800 mL Fluid  Diet effective now                    Inpatient Medications  Scheduled Meds:  amiodarone   400 mg Oral BID   Followed by   NOREEN ON 04/01/2024] amiodarone   200 mg Oral Daily   ascorbic acid   500 mg Oral BID   atorvastatin   10 mg Oral Daily   busPIRone   5 mg Oral TID   collagenase    Topical Daily   darbepoetin (ARANESP ) injection - DIALYSIS  150 mcg Subcutaneous Q Tue-1800   famotidine   20 mg Oral QHS   feeding supplement  1 Container Oral Q24H   insulin  aspart  0-15 Units Subcutaneous Q4H   insulin  glargine  5 Units Subcutaneous QHS   midodrine   20 mg Oral Q8H   multivitamin  1 tablet Oral QHS   mouth rinse  15 mL Mouth Rinse 4 times per day   risperiDONE   1 mg Oral QHS   sodium chloride  flush  10-40 mL Intracatheter Q12H   vancomycin   125 mg Per Tube Daily   Or   vancomycin   125 mg Oral Daily   Continuous Infusions:  meropenem  (MERREM ) IV 1 g (03/26/24 2226)   PRN Meds:.acetaminophen , alteplase , heparin , HYDROmorphone , hydrOXYzine , lidocaine  (PF), lidocaine -prilocaine , mouth rinse, oxyCODONE  **OR** [DISCONTINUED]  oxyCODONE , pentafluoroprop-tetrafluoroeth, polyethylene glycol, senna    Objective:   Vitals:   03/27/24 0348 03/27/24 0500 03/27/24 0624 03/27/24 0800  BP: (!) 88/54  (!) 76/60 92/60  Pulse:   (!) 120 (!) 104  Resp: 15  (!) 9 16  Temp:    97.9 F (36.6 C)  TempSrc:    Axillary  SpO2:   100% 98%  Weight:  105.6 kg      Wt Readings from Last 3 Encounters:  03/27/24 105.6 kg  02/25/24 97.2 kg  06/20/23 114.5 kg    No intake or output data in the 24 hours ending 03/27/24 1019    Physical Exam  Awake Alert, No new F.N deficits, right IJ HD catheter in place, [place].AT,PERRAL Supple Neck, No JVD,   Symmetrical Chest wall movement, Good air movement bilaterally, CTAB RRR,No Gallops,Rubs or new Murmurs,  +ve B.Sounds, Abd Soft, No tenderness,   No Cyanosis, Clubbing or edema     RN pressure injury documentation: Wound 02/19/24 0200 Pressure Injury Foot Left;Lateral Unstageable - Full thickness tissue loss in which  the base of the injury is covered by slough (yellow, tan, gray, green or brown) and/or eschar (tan, brown or black) in the wound bed. (Active)     Wound 03/17/24 0045 Pressure Injury Thigh Left;Posterior Stage 2 -  Partial thickness loss of dermis presenting as a shallow open injury with a red, pink wound bed without slough. (Active)     Wound 03/17/24 0045 Pressure Injury Hip Lateral;Right Stage 2 -  Partial thickness loss of dermis presenting as a shallow open injury with a red, pink wound bed without slough. (Active)     Wound 03/17/24 0045 Pressure Injury Abdomen Lower;Left Stage 2 -  Partial thickness loss of dermis presenting as a shallow open injury with a red, pink wound bed without slough. (Active)     Wound 03/17/24 1119 Pressure Injury Buttocks Right;Left;Medial Stage 3 -  Full thickness tissue loss. Subcutaneous fat may be visible but bone, tendon or muscle are NOT exposed. (Active)      Data Review:    Recent Labs  Lab 03/21/24 0524 03/24/24 0730   WBC 8.3 16.8*  HGB 8.0* 7.7*  HCT 24.7* 24.2*  PLT 82* 218  MCV 96.5 98.0  MCH 31.3 31.2  MCHC 32.4 31.8  RDW 20.3* 20.5*  LYMPHSABS  --  1.9  MONOABS  --  0.9  EOSABS  --  0.2  BASOSABS  --  0.0    Recent Labs  Lab 03/20/24 1702 03/21/24 0524 03/24/24 0730 03/24/24 1618  NA 137 137 136 135  K 3.6 3.5 3.9 3.6  CL 104 105 103 101  CO2 24 25 23 24   ANIONGAP 9 8 10 10   GLUCOSE 269* 208* 87 139*  BUN 13 19 37* 19  CREATININE 1.45* 1.84* 4.11* 2.33*  ALBUMIN  2.2* 2.1*  --  1.8*  MG  --  2.3  --  1.9  PHOS 2.4* 2.3*  --  2.9  CALCIUM  7.4* 7.6* 6.9* 7.1*      Recent Labs  Lab 03/20/24 1702 03/21/24 0524 03/24/24 0730 03/24/24 1618  MG  --  2.3  --  1.9  CALCIUM  7.4* 7.6* 6.9* 7.1*    --------------------------------------------------------------------------------------------------------------- Lab Results  Component Value Date   CHOL 104 07/22/2019   HDL 30 (L) 07/22/2019   LDLCALC 37 07/22/2019   TRIG 185 (H) 07/22/2019   CHOLHDL 3.5 07/22/2019    Lab Results  Component Value Date   HGBA1C 4.3 (L) 02/15/2024   No results for input(s): TSH, T4TOTAL, FREET4, T3FREE, THYROIDAB in the last 72 hours. No results for input(s): VITAMINB12, FOLATE, FERRITIN, TIBC, IRON , RETICCTPCT in the last 72 hours. ------------------------------------------------------------------------------------------------------------------ Cardiac Enzymes No results for input(s): CKMB, TROPONINI, MYOGLOBIN in the last 168 hours.  Invalid input(s): CK  Radiology Report No results found.    Signature  -   Lavada Stank M.D on 03/27/2024 at 10:19 AM   -  To page go to [email or web address]   "

## 2024-03-27 NOTE — TOC Progression Note (Signed)
 Transition of Care [facility]) - Progression Note    Patient Details  Name: Deborah [name] MRN: [id number] Date of Birth: 1965-12-05  [facility] CM/SW Contact  Almarie CHRISTELLA Goodie, [place] Phone Number: 03/27/2024, 3:53 PM  Clinical Narrative:   CSW received message from palliative NP that patient's mother wanted a call from social worker. CSW contacted patient's mother, Therisa, and introduced self. Mother did not have any questions, just wanted to know if the patient had a child psychotherapist and to get acquainted with them. CSW explained she is in coverage and not the floor social worker, and mother would like an introduction to the floor social worker when able. CSW to relay message. CSW to follow.    Expected Discharge Plan: Long Term Nursing Home Barriers to Discharge: Continued Medical Work up               Expected Discharge Plan and Services       Living arrangements for the past 2 months: Skilled Nursing Facility                                       Social Drivers of Health (SDOH) Interventions SDOH Screenings   Food Insecurity: No Food Insecurity (03/18/2024)  Housing: Low Risk (03/18/2024)  Transportation Needs: No Transportation Needs (03/18/2024)  Utilities: Not At Risk (03/18/2024)  Recent Concern: Utilities - At Risk (02/18/2024)  Depression (PHQ2-9): Medium Risk (03/16/2023)  Financial Resource Strain: Low Risk (03/23/2021)   Received from [facility])  Social Connections: Low Risk (03/23/2021)   Received from [facility])  Stress: Low Risk (03/23/2021)   Received from [facility])  Tobacco Use: Low Risk (03/18/2024)    Readmission Risk Interventions    02/20/2024   11:05 AM 03/07/2023    4:19 PM 11/25/2022    5:16 PM  Readmission Risk Prevention Plan  Transportation Screening Complete Complete Complete  Medication Review (RN Care Manager) Complete Complete Complete  PCP or Specialist  appointment within 3-5 days of discharge Complete Complete Complete  HRI or Home Care Consult Complete Complete Complete  SW Recovery Care/Counseling Consult Complete Complete Complete  Palliative Care Screening Not Applicable Complete Not Applicable  Skilled Nursing Facility Complete Not Applicable Not Applicable

## 2024-03-27 NOTE — Progress Notes (Signed)
 Pt. Refused to go in for HD.

## 2024-03-27 NOTE — Progress Notes (Signed)
 PT Cancellation Note  Patient Details Name: GUERLINE HAPP MRN: [id number] DOB: 1965/08/25   Cancelled Treatment:    Reason Eval/Treat Not Completed: Patient at procedure or test/unavailable (Pt off floor at HD. Will follow up as able.)   Darryle George 03/27/2024, 2:38 PM

## 2024-03-27 NOTE — Progress Notes (Signed)
" °   03/27/24 1659  Vitals  Temp 98 F (36.7 C)  Pulse Rate (!) 112  Resp 19  BP (!) 91/58  SpO2 100 %  O2 Device Room Air  Weight 101.9 kg  Type of Weight Post-Dialysis  Oxygen Therapy  Patient Activity (if Appropriate) In bed  Pulse Oximetry Type Continuous  Post Treatment  Dialyzer Clearance Clear  Liters Processed 84  Fluid Removed (mL) 500 mL  Tolerated HD Treatment Yes  Post-Hemodialysis Comments Pt. tolerated tx well    "

## 2024-03-27 NOTE — NC FL2 (Signed)
 " [place]  MEDICAID FL2 LEVEL OF CARE FORM     IDENTIFICATION  Patient Name: Deborah [name] Birthdate: 1965/12/04 Sex: female Admission Date (Current Location): 03/16/2024  [facility] and [place] Number:  Producer, Television/film/video and Address:  [facility] [place], [address] [place], [place] [address]      Provider Number: [id number]  Attending Physician Name and Address:  Dennise Lavada POUR, MD  Relative Name and Phone Number:  Mother, Emergency Contact  [phone] (Mobile)  1 more contact   Patient location Unit: [facility]5W [facility] PCU  Bed: 5W18C / 4T81R-98    Current Level of Care: Hospital Recommended Level of Care: Skilled Nursing Facility Prior Approval Number:    Date Approved/Denied:   PASRR Number: [id number]  Discharge Plan: SNF    Current Diagnoses: Patient Active Problem List   Diagnosis Date Noted   Sepsis due to Klebsiella (HCC) 03/23/2024   Non-healing open wound of left groin 03/22/2024   ESRD (end stage renal disease) (HCC) 03/21/2024   Encounter for continuous renal replacement therapy (CRRT) for acute renal failure 03/20/2024   Bacteremia due to Klebsiella pneumoniae 03/19/2024   Encephalopathy acute 03/19/2024   Septic shock (HCC) 03/19/2024   Syncope 03/16/2024   Altered mental status 02/14/2024   Obesity, class 2 06/09/2023   Acute on chronic diastolic CHF (congestive heart failure) (HCC) 05/29/2023   Gastroparesis 05/29/2023   Chronic wound of left heel 05/29/2023   Amputation of right lower extremity below knee (HCC) 04/30/2023   Non-pressure chronic ulcer of other part of left foot limited to breakdown of skin (HCC) 04/30/2023   Dysphagia 04/28/2023   Volume overload 04/25/2023   Chronic diastolic CHF (congestive heart failure) (HCC) 04/25/2023   Carcinoid tumor of abdomen (HCC) 04/25/2023   PVD (peripheral vascular disease) 04/25/2023   History of GI bleed 04/25/2023   History of arteriovenous malformation (AVM)  04/25/2023   Generalized anxiety disorder 04/25/2023   History of reactive airway disease 04/25/2023   History of spinal stenosis 04/25/2023   Chronic diabetic ulcer of left foot determined by examination (HCC) 04/25/2023   Diarrhea 03/01/2023   Intractable diarrhea 02/28/2023   Sinus tachycardia 02/17/2023   Cellulitis of heel, left 02/17/2023   Hx of BKA, right (HCC) 01/31/2023   Tardive dyskinesia 01/06/2023   Peutz-Jeghers polyps of small bowel (HCC) 12/24/2022   Gastric AVM 12/23/2022   Anemia of chronic disease 12/14/2022   Pressure injury of skin 11/30/2022   Non-pressure chronic ulcer of right heel and midfoot limited to breakdown of skin (HCC) 11/24/2022   Severe sepsis (HCC) 11/23/2022   ESRD on dialysis (HCC) 09/09/2022   Atrial fibrillation with RVR (HCC)-resolved 09/09/2022   Peripheral neuropathy 09/09/2022   (HFpEF) heart failure with preserved ejection fraction (HCC) 09/09/2022   Hypotension 09/09/2022   CHF (congestive heart failure) (HCC) 07/13/2022   Hyponatremia 05/29/2022   Leukocytosis 05/11/2022   Edema of left upper extremity 03/21/2022   Acute pyelonephritis    Paroxysmal atrial fibrillation (HCC)    Chronic pain syndrome/chronic abdominal pain 09/04/2019   Malignant carcinoid tumor of duodenum (HCC)    NASH (nonalcoholic steatohepatitis) 06/05/2019   Chronic diarrhea with history of C.Diff    Restrictive lung disease secondary to obesity    History of gastric ulcer    Fibroid uterus 02/23/2019   Abdominal pain 07/17/2018   Anxiety 11/29/2017   Spinal stenosis, lumbar region with neurogenic claudication 08/03/2017   GERD (gastroesophageal reflux disease) 03/19/2017   Chronic  depression/anxiety 03/19/2017   Morbid obesity (HCC)    Normocytic anemia 08/16/2016   Chronic gout 06/05/2016   Chronic hypokalemia 09/26/2015   Hypomagnesemia and hypokalemia 09/26/2015   Insulin  dependent type 2 diabetes mellitus (HCC) 05/25/2015   Essential hypertension  09/28/2013    Orientation RESPIRATION BLADDER Height & Weight     Self, Time, Situation, Place  Normal Incontinent Weight: 232 lb 12.9 oz (105.6 kg) Height:     BEHAVIORAL SYMPTOMS/MOOD NEUROLOGICAL BOWEL NUTRITION STATUS      Incontinent Diet (See dc summary)  AMBULATORY STATUS COMMUNICATION OF NEEDS Skin   Extensive Assist Verbally PU Stage and Appropriate Care, Other (Comment), Surgical wounds (stage 2 pressure injury left thigh, stage 2 PI lateral hip, Stage 2 PI abdomen, stage 3 PI buttocks, PI left foot; surgical incision on groin  ; fistula)                       Personal Care Assistance Level of Assistance  Bathing, Feeding, Dressing Bathing Assistance: Maximum assistance Feeding assistance: Limited assistance Dressing Assistance: Maximum assistance     Functional Limitations Info             SPECIAL CARE FACTORS FREQUENCY               Contractures Contractures Info: Not present    Additional Factors Info  Code Status, Allergies, Isolation Precautions Code Status Info: Full Allergies Info: Gabapentin , Isovue  (Iopamidol ), Nsaids, Penicillins, Reglan  (Metoclopramide ), Valium  (Diazepam ), Zestril (Lisinopril), Tolectin (Tolmetin), Asa (Aspirin ), Aspartame And Phenylalanine, Bentyl  (Dicyclomine ), Hibiclens  (Chlorhexidine  Gluconate), Flexeril  (Cyclobenzaprine ), Oxycontin  (Oxycodone ), Rifamycins, Tylenol  (Acetaminophen ), Ultram  (Tramadol )     Isolation Precautions Info: ESBL     Current Medications (03/27/2024):  This is the current hospital active medication list Current Facility-Administered Medications  Medication Dose Route Frequency Provider Last Rate Last Admin   acetaminophen  (TYLENOL ) tablet 650 mg  650 mg Oral Q6H PRN Laron Agent, RPH       alteplase  (CATHFLO ACTIVASE ) injection 2 mg  2 mg Intracatheter Once PRN Anderson, Courtney E, NP       amiodarone  (PACERONE ) tablet 400 mg  400 mg Oral BID Barbaraann Darryle Ned, MD   400 mg at 03/26/24 2143    Followed by   NOREEN ON 04/01/2024] amiodarone  (PACERONE ) tablet 200 mg  200 mg Oral Daily O'Neal, Darryle Ned, MD       ascorbic acid  (VITAMIN C ) tablet 500 mg  500 mg Oral BID Laron Agent, RPH   500 mg at 03/26/24 9052   atorvastatin  (LIPITOR) tablet 10 mg  10 mg Oral Daily Laron Agent, RPH   10 mg at 03/26/24 9052   busPIRone  (BUSPAR ) tablet 5 mg  5 mg Oral TID Laron Agent, RPH   5 mg at 03/26/24 2144   collagenase  (SANTYL ) ointment   Topical Daily Gretta Leita SQUIBB, DO   Given at 03/25/24 9141   Darbepoetin Alfa  (ARANESP ) injection 150 mcg  150 mcg Subcutaneous Q Tue-1800 Dennise Hoes, MD   150 mcg at 03/20/24 1753   famotidine  (PEPCID ) tablet 20 mg  20 mg Oral QHS Laron Agent, RPH   20 mg at 03/25/24 2247   feeding supplement (BOOST / RESOURCE BREEZE) liquid 1 Container  1 Container Oral Q24H Singh, Prashant K, MD   1 Container at 03/26/24 0948   heparin  injection 1,000 Units  1,000 Units Dialysis PRN Anderson, Courtney E, NP       HYDROmorphone  (DILAUDID ) tablet 1 mg  1 mg Oral Q6H  PRN Singh, Prashant K, MD   1 mg at 03/26/24 2328   hydrOXYzine  (ATARAX ) tablet 25 mg  25 mg Oral Q4H PRN Crozier, Peyton, PA-C   25 mg at 03/26/24 9052   insulin  aspart (novoLOG ) injection 0-15 Units  0-15 Units Subcutaneous Q4H Paliwal, Ria, MD   2 Units at 03/27/24 0759   insulin  glargine (LANTUS ) injection 5 Units  5 Units Subcutaneous QHS Paliwal, Aditya, MD   5 Units at 03/26/24 2140   lidocaine  (PF) (XYLOCAINE ) 1 % injection 5 mL  5 mL Intradermal PRN Lenon Charmaine BRAVO, NP       lidocaine -prilocaine  (EMLA ) cream 1 Application  1 Application Topical PRN Lenon Charmaine BRAVO, NP       meropenem  (MERREM ) 1 g in sodium chloride  0.9 % 100 mL IVPB  1 g Intravenous Q24H Sharie Bourbon, MD 200 mL/hr at 03/26/24 2226 1 g at 03/26/24 2226   midodrine  (PROAMATINE ) tablet 20 mg  20 mg Oral Q8H Lenon Charmaine E, NP   20 mg at 03/27/24 0405   multivitamin (RENA-VIT) tablet 1 tablet  1 tablet Oral QHS  Singh, Prashant K, MD   1 tablet at 03/25/24 2247   Oral care mouth rinse  15 mL Mouth Rinse 4 times per day Sharie Bourbon, MD   15 mL at 03/27/24 0800   Oral care mouth rinse  15 mL Mouth Rinse PRN Sharie Bourbon, MD       oxyCODONE  (Oxy IR/ROXICODONE ) immediate release tablet 5 mg  5 mg Oral Q6H PRN Dagenhart, Jamie H, NP   5 mg at 03/26/24 1330   pentafluoroprop-tetrafluoroeth (GEBAUERS) aerosol 1 Application  1 Application Topical PRN Lenon Charmaine BRAVO, NP       polyethylene glycol (MIRALAX  / GLYCOLAX ) packet 17 g  17 g Oral Daily PRN Dagenhart, Jamie H, NP       risperiDONE  (RISPERDAL  M-TABS) disintegrating tablet 1 mg  1 mg Oral QHS Crozier, Peyton, PA-C   1 mg at 03/26/24 2142   senna (SENOKOT) tablet 8.6 mg  1 tablet Oral BID PRN Dagenhart, Jamie H, NP       sodium chloride  flush (NS) 0.9 % injection 10-40 mL  10-40 mL Intracatheter Q12H Claudene Toribio BROCKS, MD   10 mL at 03/26/24 0948   vancomycin  (VANCOCIN ) 50 mg/mL oral solution SOLN 125 mg  125 mg Per Tube Daily Sharie Bourbon, MD   125 mg at 03/25/24 9142   Or   vancomycin  (VANCOCIN ) capsule 125 mg  125 mg Oral Daily Sharie Bourbon, MD   125 mg at 03/26/24 9052     Discharge Medications: Please see discharge summary for a list of discharge medications.  Relevant Imaging Results:  Relevant Lab Results:   Additional Information [id number]. HD pt: TTS at [facility] [address] 12:10pm  Inocente GORMAN Kindle, LCSW     "

## 2024-03-28 DIAGNOSIS — Z89519 Acquired absence of unspecified leg below knee: Secondary | ICD-10-CM | POA: Diagnosis not present

## 2024-03-28 DIAGNOSIS — R21 Rash and other nonspecific skin eruption: Secondary | ICD-10-CM

## 2024-03-28 DIAGNOSIS — A499 Bacterial infection, unspecified: Secondary | ICD-10-CM

## 2024-03-28 DIAGNOSIS — D72829 Elevated white blood cell count, unspecified: Secondary | ICD-10-CM | POA: Diagnosis not present

## 2024-03-28 DIAGNOSIS — L02419 Cutaneous abscess of limb, unspecified: Secondary | ICD-10-CM | POA: Diagnosis not present

## 2024-03-28 DIAGNOSIS — L26 Exfoliative dermatitis: Secondary | ICD-10-CM | POA: Diagnosis not present

## 2024-03-28 DIAGNOSIS — B961 Klebsiella pneumoniae [K. pneumoniae] as the cause of diseases classified elsewhere: Secondary | ICD-10-CM | POA: Diagnosis not present

## 2024-03-28 DIAGNOSIS — Z1612 Extended spectrum beta lactamase (ESBL) resistance: Secondary | ICD-10-CM | POA: Diagnosis not present

## 2024-03-28 DIAGNOSIS — A0472 Enterocolitis due to Clostridium difficile, not specified as recurrent: Secondary | ICD-10-CM | POA: Diagnosis not present

## 2024-03-28 DIAGNOSIS — R7881 Bacteremia: Secondary | ICD-10-CM

## 2024-03-28 DIAGNOSIS — T827XXA Infection and inflammatory reaction due to other cardiac and vascular devices, implants and grafts, initial encounter: Secondary | ICD-10-CM

## 2024-03-28 DIAGNOSIS — I480 Paroxysmal atrial fibrillation: Secondary | ICD-10-CM | POA: Diagnosis not present

## 2024-03-28 DIAGNOSIS — Z992 Dependence on renal dialysis: Secondary | ICD-10-CM | POA: Diagnosis not present

## 2024-03-28 DIAGNOSIS — N186 End stage renal disease: Secondary | ICD-10-CM | POA: Diagnosis not present

## 2024-03-28 LAB — TYPE AND SCREEN
ABO/RH(D): O POS
Antibody Screen: NEGATIVE
Donor AG Type: NEGATIVE
Donor AG Type: NEGATIVE
Unit division: 0
Unit division: 0

## 2024-03-28 LAB — BPAM RBC
Blood Product Expiration Date: 202602012359
Blood Product Expiration Date: 202602022359
ISSUE DATE / TIME: 202601081148
Unit Type and Rh: 5100
Unit Type and Rh: 5100

## 2024-03-28 LAB — GLUCOSE, CAPILLARY
Glucose-Capillary: 110 mg/dL — ABNORMAL HIGH (ref 70–99)
Glucose-Capillary: 115 mg/dL — ABNORMAL HIGH (ref 70–99)
Glucose-Capillary: 194 mg/dL — ABNORMAL HIGH (ref 70–99)

## 2024-03-28 MED ORDER — FLUCONAZOLE 200 MG PO TABS
200.0000 mg | ORAL_TABLET | ORAL | Status: AC
  Administered 2024-04-04 – 2024-04-11 (×2): 200 mg via ORAL
  Filled 2024-03-28 (×2): qty 2

## 2024-03-28 MED ORDER — SODIUM CHLORIDE 0.9 % IV BOLUS
500.0000 mL | Freq: Once | INTRAVENOUS | Status: AC
  Administered 2024-03-28: 500 mL via INTRAVENOUS

## 2024-03-28 MED ORDER — SODIUM CHLORIDE 0.9 % IV SOLN: INTRAVENOUS | Status: DC

## 2024-03-28 NOTE — Progress Notes (Signed)
 Physical Therapy Treatment Patient Details Name: Deborah [name] MRN: [id number] DOB: November 09, 1965 Today's Date: 03/28/2024   History of Present Illness Pt is a 59 y/o female presenting from SNF with syncope. Admited for shock and sepsis. Required CRRT 1/10-1/13.  PMH: R BKA, paroxysmal atrial fibrillation, diabetes mellitus type 2, chronic pain, hyperlipidemia, ESRD on HD    PT Comments  Pt reports feeling anxious and is self limiting throughout session as well as reporting limitations due to pain. Pt declines mobility at this time but is agreeable to trial bed level exercises at this time. Pt initially able to demonstrate L ankle plantarflexion against light resistance but then requires PROM to complete activity. Pt completes quad and glute sets using multimodal cues, minimal muscle activation noted with each task. Emotional support provided throughout to assist with managing patient anxiety. Pt would benefit from continued PT services focused on strength, ROM, and transfers to promote improved tolerance to activity and return to PLOF.     If plan is discharge home, recommend the following: Two people to help with walking and/or transfers;Two people to help with bathing/dressing/bathroom;Assistance with cooking/housework;Direct supervision/assist for medications management;Direct supervision/assist for financial management;Supervision due to cognitive status;Help with stairs or ramp for entrance;Assist for transportation   Can travel by private vehicle     No (Cognition and level of assist required)  Equipment Recommendations  None recommended by PT (Pt has appropriate equipment at facility)    Recommendations for Other Services       Precautions / Restrictions Precautions Precautions: Fall Precaution/Restrictions Comments: watch vitals, a-fib Restrictions Weight Bearing Restrictions Per Provider Order: No Other Position/Activity Restrictions: hx of R BKA     Mobility  Bed  Mobility               General bed mobility comments: Pt declining rolling or repositioning in bed at this time, agreeable to bed level exercises.    Transfers                        Ambulation/Gait                   Stairs             Wheelchair Mobility     Tilt Bed    Modified Rankin (Stroke Patients Only)       Balance Overall balance assessment:  (Seated balance not observed this session.)                                          Communication Communication Communication: No apparent difficulties  Cognition Arousal: Alert Behavior During Therapy: Anxious   PT - Cognitive impairments: Awareness, Attention, Problem solving, Safety/Judgement                         Following commands: Impaired Following commands impaired: Follows one step commands with increased time, Follows one step commands inconsistently    Cueing Cueing Techniques: Verbal cues, Gestural cues, Visual cues, Tactile cues  Exercises General Exercises - Lower Extremity Ankle Circles/Pumps: PROM, 10 reps, Left, Supine (Initially AAROM, PROM due to pain) Quad Sets: AROM, 5 reps, Both, Supine (Tactile cues needed, minimal muscle activation noted.) Gluteal Sets: AROM, Supine, Both, 5 reps (Minimal muslce activation noted) Other Exercises Other Exercises: Assisted OT with cross body reaches 1x5    General  Comments General comments (skin integrity, edema, etc.): HR max 122 bpm during session, increased swelling noted in LUE, elevated on pillow as much as pt would tolerate, pts mother present during session. Pt not allowing PT to remove blanket to observe B LE. History of R BKA.      Pertinent Vitals/Pain Pain Assessment Pain Assessment: Faces Faces Pain Scale: Hurts little more Pain Location: Global Pain Descriptors / Indicators: Aching, Grimacing, Guarding, Sore, Moaning Pain Intervention(s): Limited activity within patient's tolerance,  Repositioned, Monitored during session    Home Living                          Prior Function            PT Goals (current goals can now be found in the care plan section) Acute Rehab PT Goals Patient Stated Goal: Decrease pain PT Goal Formulation: With patient Time For Goal Achievement: 04/05/24 Potential to Achieve Goals: Good Progress towards PT goals: Progressing toward goals    Frequency    Min 1X/week      PT Plan      Co-evaluation PT/OT/SLP Co-Evaluation/Treatment: Yes Reason for Co-Treatment: Complexity of the patient's impairments (multi-system involvement);Necessary to address cognition/behavior during functional activity PT goals addressed during session: Strengthening/ROM OT goals addressed during session: Strengthening/ROM      AM-PAC PT 6 Clicks Mobility   Outcome Measure  Help needed turning from your back to your side while in a flat bed without using bedrails?: Total Help needed moving from lying on your back to sitting on the side of a flat bed without using bedrails?: Total Help needed moving to and from a bed to a chair (including a wheelchair)?: Total Help needed standing up from a chair using your arms (e.g., wheelchair or bedside chair)?: Total Help needed to walk in hospital room?: Total Help needed climbing 3-5 steps with a railing? : Total 6 Click Score: 6    End of Session   Activity Tolerance: Patient limited by fatigue;Patient limited by pain (Pt limited by anxiety) Patient left: in bed;with call bell/phone within reach;with bed alarm set;with family/visitor present Nurse Communication: Mobility status;Need for lift equipment PT Visit Diagnosis: Other abnormalities of gait and mobility (R26.89);Muscle weakness (generalized) (M62.81);Pain Pain - Right/Left:  (Global)     Time: 1350-1410 PT Time Calculation (min) (ACUTE ONLY): 20 min  Charges:    $Therapeutic Exercise: 8-22 mins PT General Charges $$ ACUTE PT  VISIT: 1 Visit                     Deborah [name], PT, DPT  Acute Rehabilitation Services         Office: [phone]      Deborah [name] 03/28/2024, 3:41 PM

## 2024-03-28 NOTE — Progress Notes (Signed)
 "                                                                                                                                                                                                                                                                                PROGRESS NOTE     Patient Demographics:    Deborah [name], is a 59 y.o. female, DOB - 1965/06/04, FMW:[id number]  Outpatient Primary MD for the patient is Pcp, No    LOS - 12  Admit date - 03/16/2024    Chief Complaint  Patient presents with   Loss of Consciousness       Brief Narrative (HPI from H&P)    59 yo female with past medical history significant for PAF, HLD, right BKA, ESRD on HD (TTS) who presented from SNF to [facility] after syncopal episode with +LOC. Patient hemodynamically unstable in ED despite fluid resuscitation with 3L crystalloid and was subsequently started on vasopressors. Labs notable for metabolic acidosis, sCr 7.04, BUN 23, iCa 1.00, troponin 105, lactic acic 9.0, given 1 amp HCO3. On exam patient with diffuse abdominal pain, exam somewhat limited as she has chronic pain managed daily with Dilaudid . Patient started empirically on Cefepime , Vanc, Flagyl ; CT A/P pending, and syncope workup in progress. PCCM consulted for ICU admission.   Of note, patient recently hospitalized 02/14/2024-02/25/2024 for sepsis secondary to left thigh abscess and underwent I&D with gen surg 02/16/2024.  She was diagnosed with sepsis and subsequently Klebsiella bacteremia she was placed on IV meropenem , due to low blood pressures and underlying ESRD she was seen by nephrology and underwent multiple sessions of CRRT in ICU, she was also placed on vancomycin  prophylactically to prevent reoccurrence of C. difficile.  She was somewhat stabilized and transferred to my care on 03/22/2024 on day 6 of her hospital stay, currently blood pressure in 80s, still has NG tube, minimally responsive with generalized pain including abdominal  pain.   Significant Hospital Events: Including procedures, antibiotic start and stop dates in addition to other pertinent events   1/9 ED s/p witnessed syncopal event w/ +LOC in shock, started on Levo/Vaso; Aline/CVC placed 1/10.  CT abdomen pelvis in the ER.  Nonacute.   1/10 CRRT, still on vaso, NE. Esclated to meropenem  for ESBL Klebsiella 1/11 echocardiogram EF 60%.  No acute changes 1/12: off levophed , on vasopressin  1/13: off vasopressin , steroids, and CRRT 1/14: transfer out of ICU, TRH to pick up 1/15    Subjective:   Patient in bed, no apparent distress, she is having loose stool overnight, otherwise denies any complaints.    Assessment  & Plan :    Septic shock due to Klebsiella pneumoniae bacteremia - Source of bacteremia is unclear. - Remains with significant leukocytosis, intermittent fever. - ID input greatly appreciated, she will need line holiday, discussed with IR, they will DC tunneled catheter after HD tomorrow, n.p.o. after midnight. - Antibiotics management per ID. - Follow on repeat blood cultures. - CT abdomen pelvis negative, follow chest x-ray, on IV meropenem  . - With significant rash, crusting, unclear allergy     ESRD on HD (TTS) - -management per renal  Chronic hypotension- takes midodrine  baseline continue, minimize narcotic intake.  Patient counseled.  Baseline systolic is around 85-90.   Drug-seeking behavior, Opioid-use disorder, Hx substance abuse-- as an OP following with [facility], minimize narcotics.  Chronic pain, Hx fibromyalgia, Acute on chronic abdominal pain, continue supportive care counseled to minimize narcotic intake as blood pressure at baseline is very low.  Continues to take high doses of narcotics despite counseling, aspiration risk remains very high.  Anemia of chronic disease  -drop in Hb may be dilutional. No evidence of DIC or bleeding, monitor   History of Cdiff  - cdiff negative,  on prophylactic dose PO vanc    Syncope; suspect due to sepsis  - echo: EF 60-605%, mild LVH, small pericardial effusion posteriorly    Troponin elevation; likely demand d/t shock state, PAF-not on anticoagulation due to past history of GI bleed, troponin elevation due to demand mismatch from sepsis, trend is flat and in non-ACS pattern, echo shows preserved EF and no wall motion abnormality.  Chest pain-free no further workup.  She developed RVR with hypotension for which she was seen by cardiology and started on amiodarone  drip on 03/23/2024.   HLD  - PTA statin   Hx duodenal carcinoid tumor s/p resection (2022), Hx gastric ulcer & gastritis (2024) - Chronic gastroparesis -con't H2-blocker; stable.   S/p I&D for left thigh abscess 02/16/24.  Requested Dr. Harden to reevaluate on 03/22/2024.   Wounds present on admission-- left thigh, R hip, lower abdomen, buttocks, L heel. turns, WOC as needed Dr. Harden to evaluate left thigh postop site.  PVD s/p R BKA- stump is well healed   Sacral pressure injury - turns, unfortunately needs pressors, hard to keep that skin clear with frequent diarrhea   Possible vaginal yeast infection  -Clotrimazole  cream daily x7 days   Anxiety/Depression -Resume home buspar , risperidone , trazodone  when appropriate> waiting for mental status to normalize some   DM2; hypoglycemia from sepsis corrected with steroids -SSI PRN -goal BG 140-180  Lab Results  Component Value Date   HGBA1C 4.3 (L) 02/15/2024   CBG (last 3)  Recent Labs    03/27/24 2014 03/28/24 0010 03/28/24 0422  GLUCAP 223* 194* 115*        Condition - Extremely Guarded  Family Communication  : None present  Code Status : Full code  Consults  : PCCM, nephrology, orthopedic, ID  PUD Prophylaxis :    Procedures  :           Disposition Plan  :  Status is: Inpatient   DVT Prophylaxis  :    Place and maintain sequential compression device Start: 03/20/24 1800  Lab Results  Component Value Date   PLT  370 03/27/2024    Diet :  Diet Order             Diet NPO time specified Except for: Sips with Meds, Ice Chips  Diet effective midnight           Diet regular Room service appropriate? Yes; Fluid consistency: Thin; Fluid restriction: 1800 mL Fluid  Diet effective now                    Inpatient Medications  Scheduled Meds:  amiodarone   400 mg Oral BID   Followed by   NOREEN ON 04/01/2024] amiodarone   200 mg Oral Daily   ascorbic acid   500 mg Oral BID   atorvastatin   10 mg Oral Daily   busPIRone   5 mg Oral TID   collagenase    Topical Daily   darbepoetin (ARANESP ) injection - DIALYSIS  150 mcg Subcutaneous Q Tue-1800   famotidine   20 mg Oral QHS   feeding supplement  1 Container Oral Q24H   fluconazole   200 mg Oral Weekly   insulin  aspart  0-15 Units Subcutaneous Q4H   insulin  glargine  5 Units Subcutaneous QHS   midodrine   20 mg Oral Q8H   multivitamin  1 tablet Oral QHS   mouth rinse  15 mL Mouth Rinse 4 times per day   risperiDONE   1 mg Oral QHS   sodium chloride  flush  10-40 mL Intracatheter Q12H   vancomycin   125 mg Per Tube Daily   Or   vancomycin   125 mg Oral Daily   Continuous Infusions:  meropenem  (MERREM ) IV 1 g (03/27/24 2025)   PRN Meds:.acetaminophen , HYDROmorphone , hydrOXYzine , mouth rinse, oxyCODONE  **OR** [DISCONTINUED] oxyCODONE , polyethylene glycol, senna    Objective:   Vitals:   03/28/24 0035 03/28/24 0400 03/28/24 0624 03/28/24 0900  BP: 103/65 (!) 97/58 97/62   Pulse: (!) 114 (!) 109 100   Resp: 14 19 17    Temp:  98.2 F (36.8 C)    TempSrc:  Axillary    SpO2: 100% 100% 100%   Weight:      Height:    5' 6 (1.676 m)    Wt Readings from Last 3 Encounters:  03/27/24 101.9 kg  02/25/24 97.2 kg  06/20/23 114.5 kg     Intake/Output Summary (Last 24 hours) at 03/28/2024 1555 Last data filed at 03/28/2024 0830 Gross per 24 hour  Intake 250 ml  Output 500 ml  Net -250 ml      Physical Exam  Awake Alert, frail, chronic  ill-appearing Good air entry Regular rate and rhythm Abdomen soft Left groin wound bandaged Right BKA      RN pressure injury documentation: Wound 02/19/24 0200 Pressure Injury Foot Left;Lateral Unstageable - Full thickness tissue loss in which the base of the injury is covered by slough (yellow, tan, gray, green or brown) and/or eschar (tan, brown or black) in the wound bed. (Active)     Wound 03/17/24 0045 Pressure Injury Thigh Left;Posterior Stage 2 -  Partial thickness loss of dermis presenting as a shallow open injury with a red, pink wound bed without slough. (Active)     Wound 03/17/24 0045 Pressure Injury Hip Lateral;Right Stage 2 -  Partial thickness loss of dermis presenting as a shallow open injury with a red, pink wound bed  without slough. (Active)     Wound 03/17/24 0045 Pressure Injury Abdomen Lower;Left Stage 2 -  Partial thickness loss of dermis presenting as a shallow open injury with a red, pink wound bed without slough. (Active)     Wound 03/17/24 1119 Pressure Injury Buttocks Right;Left;Medial Stage 3 -  Full thickness tissue loss. Subcutaneous fat may be visible but bone, tendon or muscle are NOT exposed. (Active)      Data Review:    Recent Labs  Lab 03/24/24 0730 03/27/24 1328  WBC 16.8* 23.2*  HGB 7.7* 8.1*  HCT 24.2* 24.9*  PLT 218 370  MCV 98.0 97.6  MCH 31.2 31.8  MCHC 31.8 32.5  RDW 20.5* 19.8*  LYMPHSABS 1.9 2.0  MONOABS 0.9 1.7*  EOSABS 0.2 0.2  BASOSABS 0.0 0.1    Recent Labs  Lab 03/24/24 0730 03/24/24 1618 03/27/24 1327  NA 136 135 139  K 3.9 3.6 3.7  CL 103 101 105  CO2 23 24 24   ANIONGAP 10 10 10   GLUCOSE 87 139* 162*  BUN 37* 19 30*  CREATININE 4.11* 2.33* 4.33*  ALBUMIN   --  1.8* 1.7*  MG  --  1.9 2.1  PHOS  --  2.9 4.5  CALCIUM  6.9* 7.1* 7.1*      Recent Labs  Lab 03/24/24 0730 03/24/24 1618 03/27/24 1327  MG  --  1.9 2.1  CALCIUM  6.9* 7.1* 7.1*     --------------------------------------------------------------------------------------------------------------- Lab Results  Component Value Date   CHOL 104 07/22/2019   HDL 30 (L) 07/22/2019   LDLCALC 37 07/22/2019   TRIG 185 (H) 07/22/2019   CHOLHDL 3.5 07/22/2019    Lab Results  Component Value Date   HGBA1C 4.3 (L) 02/15/2024   No results for input(s): TSH, T4TOTAL, FREET4, T3FREE, THYROIDAB in the last 72 hours. No results for input(s): VITAMINB12, FOLATE, FERRITIN, TIBC, IRON , RETICCTPCT in the last 72 hours. ------------------------------------------------------------------------------------------------------------------ Cardiac Enzymes No results for input(s): CKMB, TROPONINI, MYOGLOBIN in the last 168 hours.  Invalid input(s): CK  Radiology Report No results found.    Signature  -   Brayton Lye M.D on 03/28/2024 at 3:55 PM   -  To page go to [email or web address]   "

## 2024-03-28 NOTE — Progress Notes (Signed)
 Patient is still currently refusing interventions

## 2024-03-28 NOTE — Progress Notes (Signed)
 " [facility] Progress Note   Subjective:    Seen and examined patient at bedside. She's resting in bed and appears comfortable. She denies SOB, CP, and N/V. Tolerated yesterday's HD with net UF . Discussed with ID and primary. She will need a line holiday 2nd ESBL bacteremia. Plan for HD this Friday (03/30/24) then for line holiday afterwards.   Objective Vitals:   03/28/24 0035 03/28/24 0400 03/28/24 0624 03/28/24 0900  BP: 103/65 (!) 97/58 97/62   Pulse: (!) 114 (!) 109 100   Resp: 14 19 17    Temp:  98.2 F (36.8 C)    TempSrc:  Axillary    SpO2: 100% 100% 100%   Weight:      Height:    5' 6 (1.676 m)   Physical Exam General: Ill-appearing female; awake, alert, NAD Heart: RRR, no murmur Lungs: Clear anteriorly, respirations unlabored on RA Abdomen: Soft and non-distended Extremities: Trace edema LLE Dialysis Access: R internal jugular [facility]  Filed Weights   03/27/24 1309 03/27/24 1656 03/27/24 1659  Weight: 101.5 kg 101.9 kg 101.9 kg    Intake/Output Summary (Last 24 hours) at 03/28/2024 1650 Last data filed at 03/28/2024 0830 Gross per 24 hour  Intake 250 ml  Output 500 ml  Net -250 ml    Additional Objective Labs: Basic Metabolic Panel: Recent Labs  Lab 03/24/24 0730 03/24/24 1618 03/27/24 1327  NA 136 135 139  K 3.9 3.6 3.7  CL 103 101 105  CO2 23 24 24   GLUCOSE 87 139* 162*  BUN 37* 19 30*  CREATININE 4.11* 2.33* 4.33*  CALCIUM  6.9* 7.1* 7.1*  PHOS  --  2.9 4.5   Liver Function Tests: Recent Labs  Lab 03/24/24 1618 03/27/24 1327  ALBUMIN  1.8* 1.7*   No results for input(s): LIPASE, AMYLASE in the last 168 hours. CBC: Recent Labs  Lab 03/24/24 0730 03/27/24 1328  WBC 16.8* 23.2*  NEUTROABS 13.6* 19.1*  HGB 7.7* 8.1*  HCT 24.2* 24.9*  MCV 98.0 97.6  PLT 218 370   Blood Culture    Component Value Date/Time   SDES BLOOD RIGHT ANTECUBITAL 03/16/2024 1949   SPECREQUEST  03/16/2024 1949    BOTTLES DRAWN AEROBIC AND  ANAEROBIC Blood Culture results may not be optimal due to an inadequate volume of blood received in culture bottles   CULT (A) 03/16/2024 1949    KLEBSIELLA PNEUMONIAE Confirmed Extended Spectrum Beta-Lactamase Producer (ESBL).  In bloodstream infections from ESBL organisms, carbapenems are preferred over piperacillin /tazobactam. They are shown to have a lower risk of mortality.    REPTSTATUS 03/19/2024 FINAL 03/16/2024 1949    Cardiac Enzymes: No results for input(s): CKTOTAL, CKMB, CKMBINDEX, TROPONINI in the last 168 hours. CBG: Recent Labs  Lab 03/27/24 1206 03/27/24 1748 03/27/24 2014 03/28/24 0010 03/28/24 0422  GLUCAP 190* 145* 223* 194* 115*   Iron  Studies: No results for input(s): IRON , TIBC, TRANSFERRIN, FERRITIN in the last 72 hours. Lab Results  Component Value Date   INR 1.8 (H) 03/18/2024   INR 1.9 (H) 03/17/2024   INR 1.5 (H) 03/22/2023   Studies/Results: No results found.  Medications:  meropenem  (MERREM ) IV 1 g (03/27/24 2025)    amiodarone   400 mg Oral BID   Followed by   NOREEN ON 04/01/2024] amiodarone   200 mg Oral Daily   ascorbic acid   500 mg Oral BID   atorvastatin   10 mg Oral Daily   busPIRone   5 mg Oral TID   collagenase    Topical  Daily   darbepoetin (ARANESP ) injection - DIALYSIS  150 mcg Subcutaneous Q Tue-1800   famotidine   20 mg Oral QHS   feeding supplement  1 Container Oral Q24H   fluconazole   200 mg Oral Weekly   insulin  aspart  0-15 Units Subcutaneous Q4H   insulin  glargine  5 Units Subcutaneous QHS   midodrine   20 mg Oral Q8H   multivitamin  1 tablet Oral QHS   mouth rinse  15 mL Mouth Rinse 4 times per day   risperiDONE   1 mg Oral QHS   sodium chloride  flush  10-40 mL Intracatheter Q12H   vancomycin   125 mg Per Tube Daily   Or   vancomycin   125 mg Oral Daily    Dialysis Orders: [facility]  B400   96.1kg  TDC   Hep none Last OP HD 1/08, post wt 100kg  Assessment/Plan:  ESRD - on HD TTS OP -s/p CRRT  1/10-1/13 given significant shock - Next HD 1/23 d/t plan for line holiday, see below   PAfib -cardiology following, on IV amio   Septic shock/ hypotension - s/p pressors - getting po midodrine , see below   Sepsis -secondary to klebsiella pn bacteremia (ESBL) -on meropenem  and vanc -also on po vanc -per primary service -Repeat bcx did not clear so will need a line holiday. Plan for HD 1/23 then [facility] removal afterwards for a line holiday. Tentative plan for new [facility] on Monday (1/26). This plan may need adjustment based on labs and patient's volume. Monitor both closely.   Volume - UF as tolerated with midodrine  support -Bps remain low and I raised Midodrine  to 20mg  TID for BP support. Okay to give an extra 20mg  dose mid-run with HD if needed. Order IV Alb for additional BP support   Anemia of esrd - follow, transfuse prn  -avoid IV Fe for now, started aranesp  1/13 -hgb 8.1   Diarrhea - getting empiric po vanc - had diarrhea and concern for Cdif, neg testing 1/10   Wounds -mgmt per primary service   Dispo/GOC -Inpatient -She doesn't look good -Palliative care has seen this patient multiple times. Per today's note, patient wishes to continue full scope of care. Appreciate Palliative's assistance  [name], NP [facility] 03/28/2024,4:50 PM  LOS: 12 days    "

## 2024-03-28 NOTE — Progress Notes (Signed)
 Occupational Therapy Treatment Patient Details Name: Deborah [name] MRN: [id number] DOB: 1965/08/12 Today's Date: 03/28/2024   History of present illness Pt is a 59 y/o female presenting from SNF with syncope. Admited for shock and sepsis. Required CRRT 1/10-1/13.  PMH: R BKA, paroxysmal atrial fibrillation, diabetes mellitus type 2, chronic pain, hyperlipidemia, ESRD on HD   OT comments  Pt seen in conjunction with PT to maximize pts activity tolerance and optimize pt participation. Pt agreeable to bed level therex only, pt was emotional at times and reported being overwhelmed needing increased time therapeutic   use of self. Pt completed below therex from bed level. Patient will benefit from continued inpatient follow up therapy, <3 hours/day        If plan is discharge home, recommend the following:  Two people to help with walking and/or transfers;Two people to help with bathing/dressing/bathroom;Assist for transportation   Equipment Recommendations  None recommended by OT    Recommendations for Other Services      Precautions / Restrictions Precautions Precautions: Fall Precaution/Restrictions Comments: watch vitals Restrictions Weight Bearing Restrictions Per Provider Order: No Other Position/Activity Restrictions: hx of R BKA       Mobility Bed Mobility                    Transfers                         Balance                                           ADL either performed or assessed with clinical judgement   ADL                                         General ADL Comments: focused session on building rapport/trust and initiating therex HEP    Extremity/Trunk Assessment Upper Extremity Assessment Upper Extremity Assessment: RUE deficits/detail;LUE deficits/detail RUE Deficits / Details: impaired shoulder flexion to < 25*, increased swelling noted, pt reported painful elbow RUE: Unable to fully  assess due to pain RUE Sensation: WNL RUE Coordination: decreased fine motor;decreased gross motor LUE Deficits / Details: impaired shoulder flexion to < 25* LUE: Unable to fully assess due to pain LUE Sensation: WNL LUE Coordination: decreased fine motor;decreased gross motor   Lower Extremity Assessment Lower Extremity Assessment: Defer to PT evaluation        Vision Ability to See in Adequate Light: 0 Adequate Patient Visual Report: No change from baseline     Perception Perception Perception: Not tested   Praxis Praxis Praxis: Not tested   Communication Communication Communication: No apparent difficulties   Cognition Arousal: Alert Behavior During Therapy: Anxious Cognition: Cognition impaired     Awareness: Intellectual awareness impaired, Online awareness intact Memory impairment (select all impairments): Non-declarative long-term memory Attention impairment (select first level of impairment): Focused attention Executive functioning impairment (select all impairments): Initiation, Organization, Sequencing, Reasoning, Problem solving OT - Cognition Comments: pt emotional during session, reporting feel overwhelmed, unable to multitask in session as pt focused on ordering her hot chocolate vs following instructions from therapist                 Following commands: Impaired Following commands impaired: Follows one step  commands with increased time      Cueing   Cueing Techniques: Verbal cues, Gestural cues, Tactile cues, Visual cues  Exercises Other Exercises Other Exercises: elbow flexion/extension x5 reps BLE from bed level, left theraband on bed rails to work on in later sessions Other Exercises: lateral/crossing midline reaching from bed level x5 reps each BUE    Shoulder Instructions       General Comments HR max 122 bpm during session, increased swelling noted in LUE, elevated on pillow as much as pt would tolerate, pts mother present during  session    Pertinent Vitals/ Pain       Pain Assessment Pain Assessment: Faces Faces Pain Scale: Hurts little more Pain Location: bilateral elbows Pain Descriptors / Indicators: Grimacing, Sore Pain Intervention(s): Limited activity within patient's tolerance, Monitored during session, Repositioned  Home Living                                          Prior Functioning/Environment              Frequency  Min 1X/week        Progress Toward Goals  OT Goals(current goals can now be found in the care plan section)  Progress towards OT goals: Progressing toward goals (gradually)  Acute Rehab OT Goals Patient Stated Goal: to get hot chocolate OT Goal Formulation: With patient Time For Goal Achievement: 04/05/24 Potential to Achieve Goals: Fair  Plan      Co-evaluation    PT/OT/SLP Co-Evaluation/Treatment: Yes Reason for Co-Treatment: Complexity of the patient's impairments (multi-system involvement);Necessary to address cognition/behavior during functional activity   OT goals addressed during session: Strengthening/ROM      AM-PAC OT 6 Clicks Daily Activity     Outcome Measure   Help from another person eating meals?: A Little Help from another person taking care of personal grooming?: A Little Help from another person toileting, which includes using toliet, bedpan, or urinal?: Total Help from another person bathing (including washing, rinsing, drying)?: Total Help from another person to put on and taking off regular upper body clothing?: A Lot Help from another person to put on and taking off regular lower body clothing?: Total 6 Click Score: 11    End of Session Equipment Utilized During Treatment: Other (comment) (theraband)  OT Visit Diagnosis: Muscle weakness (generalized) (M62.81)   Activity Tolerance Patient tolerated treatment well   Patient Left in bed;with call bell/phone within reach;with bed alarm set   Nurse Communication  Mobility status (secure chat)        Time: 8653-8590 OT Time Calculation (min): 23 min  Charges: OT General Charges $OT Visit: 1 Visit OT Treatments $Therapeutic Exercise: 8-22 mins  Ronal Mallie POUR., COTA/L Acute Rehabilitation Services [phone]   Ronal Mallie Needy 03/28/2024, 2:53 PM

## 2024-03-28 NOTE — Progress Notes (Signed)
 Patient is currently refusing turns- provided education on skin and wound care-patient is still currently declining turns

## 2024-03-28 NOTE — Consult Note (Addendum)
 "       Date of Admission:  03/16/2024          Reason for Consult: fevers, leukocytosis in patient with known ESBL bacteremia and tunneled HD catheter    Referring Provider: Brayton Lye, MD   Assessment:  ESBL bacteremia with ongoing fevers and leukocytosis and patient with tunneled hemodialysis catheter History of thigh abscess status post I&D in December Desquamating bullous diffuse rash C. difficile colitis History of paroxysmal atrial fibrillation History of below the knee amputation  Plan:  Continue Merrem  Vancomycin  po prophylaxis HD catheter to be removed on Friday MRI thigh COnsider transfer to tertiary care center given exfoliative extensive rash Contact precations       HPI: Deborah [name] 59 year old woman with end-stage renal disease on hemodialysis, paroxysmal atrial fibrillation hyperlipidemia history of right below the knee amputation who was transported to [facility] from her skilled nursing facility after syncopal episode and loss of consciousness.  She was hemodynamically unstable require despite fluid resuscitation and was started on vasopressors.  CT of the abdomen pelvis performed on 10 January failed to show any intra-abdominal pathology.  Of note she had had a previous hospitalization for sepsis thought to be secondary left thigh abscess status post I&D by surgery on 16 February 2024.  Blood cultures taken on this admission open positive for Klebsiella pneumonia and 2 of 2 sites taken.  Her HD catheter remains in place and she has been treated through the catheter -ie it has not been removed x 11 days.  Also has a history of C. difficile colitis and is on vancomycin  prophylactically to prevent her from having recurrence which she has not had during this hospitalization has had low-grade fevers and continues to have progression of an exfoliative dermatitis which is concerning to me for potential drug rash though she claims that this  was already happening when she was admitted and prior to initiation of antibiotics perhaps her skin findings are related to severe sepsis  Is imperative that we get source control in the she have her hemodialysis catheter removed.  Reimaging of the thigh is not unreasonable but priority would be to get her central line out I would perform an MRI without contrast of the thigh to evaluate that area  Continue meropenem  and in the interim and I certainly hope that the patient is not having an allergic reaction to meropenem  as there do not appear to be any other viable nonbeta-lactam options to treat her infection.  Should she be transferred to tertiary care center with Dermatology expertise?    I personally spent a total of 80 minutes in the care of the patient today including preparing to see the patient, getting/reviewing separately obtained history, performing a medically appropriate exam/evaluation, counseling and educating, placing orders, referring and communicating with other health care professionals, documenting clinical information in the EHR, independently interpreting results, communicating results, and coordinating care.   Evaluation of the patient requires complex antimicrobial therapy evaluation, counseling , isolation needs to reduce disease transmission and risk assessment and mitigation.     Review of Systems: Review of Systems  Constitutional:  Positive for fever. Negative for chills, malaise/fatigue and weight loss.  HENT:  Negative for congestion and sore throat.   Eyes:  Negative for blurred vision and photophobia.  Respiratory:  Negative for cough, shortness of breath and wheezing.   Cardiovascular:  Negative for chest pain, palpitations and leg swelling.  Gastrointestinal:  Negative for abdominal pain, blood in stool, constipation,  diarrhea, heartburn, melena, nausea and vomiting.  Genitourinary:  Negative for dysuria, flank pain and hematuria.  Musculoskeletal:   Negative for back pain, falls, joint pain and myalgias.  Skin:  Negative for itching and rash.  Neurological:  Negative for dizziness, focal weakness, loss of consciousness, weakness and headaches.  Endo/Heme/Allergies:  Does not bruise/bleed easily.  Psychiatric/Behavioral:  Negative for depression and suicidal ideas. The patient does not have insomnia.     Past Medical History:  Diagnosis Date   Acute back pain with sciatica, left    Acute back pain with sciatica, right    Acute encephalopathy 05/29/2022   Acute osteomyelitis of right calcaneus (HCC) 12/21/2022   Anemia, unspecified    Atrial fibrillation with RVR (HCC)-resolved 09/09/2022   Atypical chest pain 09/10/2021   Cancer [facility])    Carcinoid tumor of duodenum (HCC)    Chest pain with normal coronary angiography 2019   Chronic a-fib (HCC) 09/09/2022   Chronic pain    Chronic systolic CHF (congestive heart failure) (HCC)    Dehiscence of amputation stump of right lower extremity (HCC) 01/29/2023   Diabetes mellitus    DKA (diabetic ketoacidosis) (HCC)    Drug-seeking behavior    21 hospitalizations and 14 CT a/p in 2 years for N/V and abdominal pain, demanding only IV dilaudid    Elevated troponin    chronic   Esophageal reflux    Fibromyalgia    Gastric ulcer    Gastroparesis    Gout    HCAP (healthcare-associated pneumonia) 06/19/2022   Hyperlipidemia    Hyperosmolar hyperglycemic state (HHS) (HCC) 05/11/2022   Hyperosmolar non-ketotic state due to type 2 diabetes mellitus (HCC) 05/11/2022   Hypertension    Hypomagnesemia    Lumbosacral stenosis    LVH (left ventricular hypertrophy)    Morbid obesity (HCC)    Nausea & vomiting 09/09/2022   NICM (nonischemic cardiomyopathy) (HCC)    PAF (paroxysmal atrial fibrillation) (HCC)    Sepsis (HCC) 11/23/2022   Stroke (HCC) 02/2011   Symptomatic anemia 12/14/2022   Thrombocytosis    Vitamin B12 deficiency anemia     Social History[1]  Family History  Problem  Relation Age of Onset   Diabetes Mother    Diabetes Father    Heart disease Father    Diabetes Sister    Congestive Heart Failure Sister 52   Diabetes Brother    Allergies[2]  OBJECTIVE: Blood pressure 97/62, pulse 100, temperature 98.2 F (36.8 C), temperature source Axillary, resp. rate 17, height 5' 6 (1.676 m), weight 101.9 kg, last menstrual period 10/10/2012, SpO2 100%.  Physical Exam Exam conducted with a chaperone present.  Constitutional:      General: She is not in acute distress.    Appearance: Normal appearance. She is well-developed. She is not ill-appearing or diaphoretic.  HENT:     Head: Normocephalic and atraumatic.     Right Ear: Hearing and external ear normal.     Left Ear: Hearing and external ear normal.     Nose: No nasal deformity or rhinorrhea.  Eyes:     General: No scleral icterus.    Conjunctiva/sclera: Conjunctivae normal.     Right eye: Right conjunctiva is not injected.     Left eye: Left conjunctiva is not injected.     Pupils: Pupils are equal, round, and reactive to light.  Neck:     Vascular: No JVD.  Cardiovascular:     Rate and Rhythm: Normal rate and regular rhythm.  Heart sounds: Normal heart sounds, S1 normal and S2 normal. No murmur heard.    No friction rub. No gallop.  Pulmonary:     Effort: No respiratory distress.     Breath sounds: No wheezing.  Abdominal:     General: Bowel sounds are normal. There is no distension.     Palpations: Abdomen is soft.  Musculoskeletal:        General: Normal range of motion.     Right shoulder: Normal.     Left shoulder: Normal.     Cervical back: Normal range of motion and neck supple.     Right hip: Normal.     Left hip: Normal.     Right knee: Normal.     Left knee: Normal.  Lymphadenopathy:     Head:     Right side of head: No submandibular, preauricular or posterior auricular adenopathy.     Left side of head: No submandibular, preauricular or posterior auricular adenopathy.      Cervical: No cervical adenopathy.     Right cervical: No superficial or deep cervical adenopathy.    Left cervical: No superficial or deep cervical adenopathy.  Skin:    General: Skin is warm and dry.     Coloration: Skin is not pale.     Findings: No abrasion, bruising, ecchymosis, erythema, lesion or rash.     Nails: There is no clubbing.  Neurological:     General: No focal deficit present.     Mental Status: She is alert and oriented to person, place, and time.     Sensory: No sensory deficit.     Coordination: Coordination normal.     Gait: Gait normal.  Psychiatric:        Attention and Perception: She is attentive.        Mood and Affect: Mood normal.        Speech: Speech normal.        Behavior: Behavior normal. Behavior is cooperative.        Thought Content: Thought content normal.        Judgment: Judgment normal.    Skin   Left thigh wound with granulation tissue     Bullous exfoliating rash      Lab Results Lab Results  Component Value Date   WBC 23.2 (H) 03/27/2024   HGB 8.1 (L) 03/27/2024   HCT 24.9 (L) 03/27/2024   MCV 97.6 03/27/2024   PLT 370 03/27/2024    Lab Results  Component Value Date   CREATININE 4.33 (H) 03/27/2024   BUN 30 (H) 03/27/2024   NA 139 03/27/2024   K 3.7 03/27/2024   CL 105 03/27/2024   CO2 24 03/27/2024    Lab Results  Component Value Date   ALT <5 03/19/2024   AST 15 03/19/2024   ALKPHOS 75 03/19/2024   BILITOT 0.7 03/19/2024     Microbiology: No results found for this or any previous visit (from the past 240 hours).  Jomarie Fleeta Rothman, MD [facility] [place] for Infectious Disease [facility] [phone] pager  03/28/2024, 5:08 PM     [1]  Social History Tobacco Use   Smoking status: Never   Smokeless tobacco: Never  Vaping Use   Vaping status: Never Used  Substance Use Topics   Alcohol use: No   Drug use: No  [2]  Allergies Allergen Reactions   Asa [Aspirin ] Other (See Comments)     Hx of stomach ulcer   Bentyl  [  Dicyclomine ] Other (See Comments)    Chest pain   Gabapentin  Hives and [name]   Isovue  [Iopamidol ] Anaphylaxis, [name] and Other (See Comments)    11/28/17 Patient had seizure like activity and then 1 min code after 100 cc of isovue  300. Possible contrast allergy  vs vasovagal episode  Cardiac Arrest   Nsaids Anaphylaxis and Other (See Comments)    Hx of stomach ulcers   Penicillins Itching, Palpitations and Other (See Comments)    Flushing (Red Skin) Laryngeal Edema   Reglan  [Metoclopramide ] Other (See Comments)    Tardive dyskinesia    Valium  [Diazepam ] [name]   Zestril [Lisinopril] Anaphylaxis and Swelling    Tongue and mouth swelling Laryngeal Edema   Tolectin [Tolmetin] Nausea And Vomiting, Nausea Only and Other (See Comments)    Irritates stomach ulcer   Aspartame And Phenylalanine Hives   Flexeril  [Cyclobenzaprine ] Palpitations   Hibiclens  [Chlorhexidine  Gluconate] Dermatitis   Oxycontin  [Oxycodone ] Palpitations   Rifamycins Palpitations   Tylenol  [Acetaminophen ] Nausea And Vomiting, Nausea Only and Other (See Comments)    Irritates stomach ulcer Abdominal pain   Ultram  [Tramadol ] Nausea And Vomiting and Palpitations   "

## 2024-03-28 NOTE — Progress Notes (Signed)
 Patient is still refusing turns, refusing to be cleaned, refusing wound care, and also currently refusing to be transferred into new Kreg Bed

## 2024-03-28 NOTE — Plan of Care (Signed)
" °  Problem: Education: Goal: Knowledge of General Education information will improve Description: Including pain rating scale, medication(s)/side effects and non-pharmacologic comfort measures Outcome: Not Progressing   Problem: Health Behavior/Discharge Planning: Goal: Ability to manage health-related needs will improve Outcome: Not Progressing   Problem: Clinical Measurements: Goal: Ability to maintain clinical measurements within normal limits will improve Outcome: Not Progressing Goal: Will remain free from infection Outcome: Not Progressing   Problem: Activity: Goal: Risk for activity intolerance will decrease Outcome: Not Progressing   Problem: Coping: Goal: Level of anxiety will decrease Outcome: Not Progressing   "

## 2024-03-28 NOTE — Progress Notes (Signed)
 This chaplain is present with the Pt. for F/U on creating the Pt. Advance Directive. Family is not at the bedside.  The chaplain entered a bright room with an uneaten omelet and biscuit on the bedside table. The Pt. opened her eyes as I entered and remained lethargic throughout the visit. The chaplain will F/U with PMT.  Chaplain Leeroy Hummer [phone]

## 2024-03-28 NOTE — TOC Progression Note (Signed)
 Transition of Care [facility]) - Progression Note    Patient Details  Name: Deborah [name] MRN: [id number] Date of Birth: Sep 30, 1965  Transition of Care [facility]) CM/SW Contact  Inocente GORMAN Kindle, LCSW Phone Number: 03/28/2024, 3:24 PM  Clinical Narrative:    CSW spoke with patient's mother.    Expected Discharge Plan: Long Term Nursing Home Barriers to Discharge: Continued Medical Work up               Expected Discharge Plan and Services       Living arrangements for the past 2 months: Skilled Nursing Facility                                       Social Drivers of Health (SDOH) Interventions SDOH Screenings   Food Insecurity: No Food Insecurity (03/18/2024)  Housing: Low Risk (03/18/2024)  Transportation Needs: No Transportation Needs (03/18/2024)  Utilities: Not At Risk (03/18/2024)  Recent Concern: Utilities - At Risk (02/18/2024)  Depression (PHQ2-9): Medium Risk (03/16/2023)  Financial Resource Strain: Low Risk (03/23/2021)   Received from [facility])  Social Connections: Low Risk (03/23/2021)   Received from [facility])  Stress: Low Risk (03/23/2021)   Received from [facility])  Tobacco Use: Low Risk (03/18/2024)    Readmission Risk Interventions    03/28/2024    3:24 PM 02/20/2024   11:05 AM 03/07/2023    4:19 PM  Readmission Risk Prevention Plan  Transportation Screening Complete Complete Complete  Medication Review (RN Care Manager) Complete Complete Complete  PCP or Specialist appointment within 3-5 days of discharge Complete Complete Complete  HRI or Home Care Consult Complete Complete Complete  SW Recovery Care/Counseling Consult Complete Complete Complete  Palliative Care Screening Complete Not Applicable Complete  Skilled Nursing Facility Complete Complete Not Applicable

## 2024-03-29 DIAGNOSIS — B961 Klebsiella pneumoniae [K. pneumoniae] as the cause of diseases classified elsewhere: Secondary | ICD-10-CM

## 2024-03-29 DIAGNOSIS — L26 Exfoliative dermatitis: Secondary | ICD-10-CM | POA: Diagnosis not present

## 2024-03-29 DIAGNOSIS — Z515 Encounter for palliative care: Secondary | ICD-10-CM | POA: Diagnosis not present

## 2024-03-29 DIAGNOSIS — L304 Erythema intertrigo: Secondary | ICD-10-CM | POA: Diagnosis not present

## 2024-03-29 DIAGNOSIS — Z8619 Personal history of other infectious and parasitic diseases: Secondary | ICD-10-CM

## 2024-03-29 DIAGNOSIS — B9620 Unspecified Escherichia coli [E. coli] as the cause of diseases classified elsewhere: Secondary | ICD-10-CM | POA: Diagnosis not present

## 2024-03-29 DIAGNOSIS — Z992 Dependence on renal dialysis: Secondary | ICD-10-CM | POA: Diagnosis not present

## 2024-03-29 DIAGNOSIS — R7881 Bacteremia: Secondary | ICD-10-CM | POA: Diagnosis not present

## 2024-03-29 DIAGNOSIS — N186 End stage renal disease: Secondary | ICD-10-CM | POA: Diagnosis not present

## 2024-03-29 DIAGNOSIS — Z1612 Extended spectrum beta lactamase (ESBL) resistance: Secondary | ICD-10-CM | POA: Diagnosis not present

## 2024-03-29 LAB — RENAL FUNCTION PANEL
Albumin: 1.9 g/dL — ABNORMAL LOW (ref 3.5–5.0)
Anion gap: 12 (ref 5–15)
BUN: 22 mg/dL — ABNORMAL HIGH (ref 6–20)
CO2: 24 mmol/L (ref 22–32)
Calcium: 7.4 mg/dL — ABNORMAL LOW (ref 8.9–10.3)
Chloride: 102 mmol/L (ref 98–111)
Creatinine, Ser: 3.84 mg/dL — ABNORMAL HIGH (ref 0.44–1.00)
GFR, Estimated: 13 mL/min — ABNORMAL LOW
Glucose, Bld: 163 mg/dL — ABNORMAL HIGH (ref 70–99)
Phosphorus: 4.3 mg/dL (ref 2.5–4.6)
Potassium: 3.8 mmol/L (ref 3.5–5.1)
Sodium: 138 mmol/L (ref 135–145)

## 2024-03-29 LAB — CBC
HCT: 25.7 % — ABNORMAL LOW (ref 36.0–46.0)
Hemoglobin: 8.1 g/dL — ABNORMAL LOW (ref 12.0–15.0)
MCH: 31.8 pg (ref 26.0–34.0)
MCHC: 31.5 g/dL (ref 30.0–36.0)
MCV: 100.8 fL — ABNORMAL HIGH (ref 80.0–100.0)
Platelets: 390 K/uL (ref 150–400)
RBC: 2.55 MIL/uL — ABNORMAL LOW (ref 3.87–5.11)
RDW: 19.8 % — ABNORMAL HIGH (ref 11.5–15.5)
WBC: 19.4 K/uL — ABNORMAL HIGH (ref 4.0–10.5)
nRBC: 0 % (ref 0.0–0.2)

## 2024-03-29 LAB — GLUCOSE, CAPILLARY
Glucose-Capillary: 114 mg/dL — ABNORMAL HIGH (ref 70–99)
Glucose-Capillary: 122 mg/dL — ABNORMAL HIGH (ref 70–99)
Glucose-Capillary: 136 mg/dL — ABNORMAL HIGH (ref 70–99)
Glucose-Capillary: 162 mg/dL — ABNORMAL HIGH (ref 70–99)
Glucose-Capillary: 205 mg/dL — ABNORMAL HIGH (ref 70–99)
Glucose-Capillary: 67 mg/dL — ABNORMAL LOW (ref 70–99)
Glucose-Capillary: 83 mg/dL (ref 70–99)

## 2024-03-29 LAB — MAGNESIUM: Magnesium: 1.9 mg/dL (ref 1.7–2.4)

## 2024-03-29 MED ORDER — AMIODARONE HCL 200 MG PO TABS: 400.0000 mg | ORAL_TABLET | Freq: Two times a day (BID) | ORAL | Status: DC

## 2024-03-29 MED ORDER — AMIODARONE HCL IN DEXTROSE 360-4.14 MG/200ML-% IV SOLN
30.0000 mg/h | INTRAVENOUS | Status: DC
  Filled 2024-03-29: qty 200

## 2024-03-29 MED ORDER — GLUCOSE 40 % PO GEL: 1.0000 | ORAL | Status: AC

## 2024-03-29 MED ORDER — AMIODARONE LOAD VIA INFUSION
150.0000 mg | Freq: Once | INTRAVENOUS | Status: DC
  Filled 2024-03-29: qty 83.34

## 2024-03-29 MED ORDER — AMIODARONE LOAD VIA INFUSION
150.0000 mg | Freq: Once | INTRAVENOUS | Status: AC
  Administered 2024-03-29: 150 mg via INTRAVENOUS
  Filled 2024-03-29: qty 83.34

## 2024-03-29 MED ORDER — DEXTROSE 5 % IV SOLN
60.0000 mg/h | INTRAVENOUS | Status: DC
  Filled 2024-03-29: qty 9

## 2024-03-29 MED ORDER — AMIODARONE HCL 200 MG PO TABS: 200.0000 mg | ORAL_TABLET | Freq: Every day | ORAL | Status: DC

## 2024-03-29 MED ORDER — DEXTROSE-SODIUM CHLORIDE 5-0.45 % IV SOLN: INTRAVENOUS | Status: DC

## 2024-03-29 MED ORDER — INSULIN ASPART 100 UNIT/ML IJ SOLN: 0.0000 [IU] | Freq: Every day | INTRAMUSCULAR | Status: DC

## 2024-03-29 MED ORDER — AMIODARONE HCL 200 MG PO TABS
200.0000 mg | ORAL_TABLET | Freq: Every day | ORAL | Status: AC
  Administered 2024-04-02 – 2024-04-06 (×5): 200 mg via ORAL
  Filled 2024-03-29 (×5): qty 1

## 2024-03-29 MED ORDER — HEPARIN SODIUM (PORCINE) 1000 UNIT/ML IJ SOLN
1.9000 mL | Freq: Once | INTRAMUSCULAR | Status: AC
  Administered 2024-04-03: 2800 [IU]

## 2024-03-29 MED ORDER — GLUCOSE 40 % PO GEL
ORAL | Status: AC
  Filled 2024-03-29: qty 1.21

## 2024-03-29 MED ORDER — AMIODARONE HCL IN DEXTROSE 360-4.14 MG/200ML-% IV SOLN
60.0000 mg/h | INTRAVENOUS | Status: DC
  Filled 2024-03-29: qty 200

## 2024-03-29 MED ORDER — DEXTROSE 5 % IV SOLN
30.0000 mg/h | INTRAVENOUS | Status: DC
  Filled 2024-03-29: qty 9

## 2024-03-29 MED ORDER — INSULIN ASPART 100 UNIT/ML IJ SOLN
0.0000 [IU] | Freq: Three times a day (TID) | INTRAMUSCULAR | Status: DC
  Administered 2024-03-29: 3 [IU] via SUBCUTANEOUS
  Filled 2024-03-29: qty 5

## 2024-03-29 MED ORDER — ALBUMIN HUMAN 25 % IV SOLN
25.0000 g | Freq: Four times a day (QID) | INTRAVENOUS | Status: DC
  Administered 2024-03-29 – 2024-04-02 (×14): 25 g via INTRAVENOUS
  Filled 2024-03-29 (×15): qty 100

## 2024-03-29 MED ORDER — AMIODARONE HCL 200 MG PO TABS
400.0000 mg | ORAL_TABLET | Freq: Two times a day (BID) | ORAL | Status: AC
  Administered 2024-03-29 – 2024-03-31 (×4): 400 mg via ORAL
  Filled 2024-03-29 (×4): qty 2

## 2024-03-29 NOTE — Progress Notes (Addendum)
 " [facility] Progress Note   Subjective:    Seen and examined patient at bedside. Patient (+) for ESBL bacteremia. Discussed with primary and ID. Plan for HD tomorrow then [facility] removal afterwards for a line holiday. Tentative plan for new [facility] placement on Monday (1/26). I explained this to the patient and she started crying afterwards. She stated: this is too much. I reached out to Palliative who also saw her earlier today. Will continue the course until patient tells us  otherwise.  Objective Vitals:   03/29/24 1001 03/29/24 1002 03/29/24 1003 03/29/24 1004  BP:      Pulse: (!) 111 (!) 108 (!) 122 (!) 108  Resp:      Temp:      TempSrc:      SpO2: 95% 96% 95% 96%  Weight:      Height:       Physical Exam General: Ill-appearing female; awake, alert, NAD, tearful Heart: RRR, no murmur Lungs: Clear anteriorly, respirations unlabored on RA Abdomen: Soft and non-distended Extremities: Trace edema LLE Dialysis Access: R internal jugular [facility] Center  Filed Weights   03/27/24 1656 03/27/24 1659 03/29/24 0500  Weight: 101.9 kg 101.9 kg 106.6 kg   No intake or output data in the 24 hours ending 03/29/24 1038  Additional Objective Labs: Basic Metabolic Panel: Recent Labs  Lab 03/24/24 0730 03/24/24 1618 03/27/24 1327  NA 136 135 139  K 3.9 3.6 3.7  CL 103 101 105  CO2 23 24 24   GLUCOSE 87 139* 162*  BUN 37* 19 30*  CREATININE 4.11* 2.33* 4.33*  CALCIUM  6.9* 7.1* 7.1*  PHOS  --  2.9 4.5   Liver Function Tests: Recent Labs  Lab 03/24/24 1618 03/27/24 1327  ALBUMIN  1.8* 1.7*   No results for input(s): LIPASE, AMYLASE in the last 168 hours. CBC: Recent Labs  Lab 03/24/24 0730 03/27/24 1328  WBC 16.8* 23.2*  NEUTROABS 13.6* 19.1*  HGB 7.7* 8.1*  HCT 24.2* 24.9*  MCV 98.0 97.6  PLT 218 370   Blood Culture    Component Value Date/Time   SDES BLOOD RIGHT ANTECUBITAL 03/16/2024 1949   SPECREQUEST  03/16/2024 1949    BOTTLES DRAWN AEROBIC AND  ANAEROBIC Blood Culture results may not be optimal due to an inadequate volume of blood received in culture bottles   CULT (A) 03/16/2024 1949    KLEBSIELLA PNEUMONIAE Confirmed Extended Spectrum Beta-Lactamase Producer (ESBL).  In bloodstream infections from ESBL organisms, carbapenems are preferred over piperacillin /tazobactam. They are shown to have a lower risk of mortality.    REPTSTATUS 03/19/2024 FINAL 03/16/2024 1949    Cardiac Enzymes: No results for input(s): CKTOTAL, CKMB, CKMBINDEX, TROPONINI in the last 168 hours. CBG: Recent Labs  Lab 03/28/24 2025 03/29/24 0018 03/29/24 0408 03/29/24 0501 03/29/24 0843  GLUCAP 110* 114* 67* 83 122*   Iron  Studies: No results for input(s): IRON , TIBC, TRANSFERRIN, FERRITIN in the last 72 hours. Lab Results  Component Value Date   INR 1.8 (H) 03/18/2024   INR 1.9 (H) 03/17/2024   INR 1.5 (H) 03/22/2023   Studies/Results: No results found.  Medications:  albumin  human 25 g (03/29/24 0949)   meropenem  (MERREM ) IV 1 g (03/28/24 2313)    amiodarone   150 mg Intravenous Once   amiodarone   400 mg Oral BID   Followed by   NOREEN ON 04/01/2024] amiodarone   200 mg Oral Daily   ascorbic acid   500 mg Oral BID   atorvastatin   10 mg Oral Daily  busPIRone   5 mg Oral TID   collagenase    Topical Daily   darbepoetin (ARANESP ) injection - DIALYSIS  150 mcg Subcutaneous Q Tue-1800   dextrose   1 Tube Oral STAT   famotidine   20 mg Oral QHS   feeding supplement  1 Container Oral Q24H   fluconazole   200 mg Oral Weekly   insulin  aspart  0-5 Units Subcutaneous QHS   insulin  aspart  0-9 Units Subcutaneous TID WC   insulin  glargine  5 Units Subcutaneous QHS   midodrine   20 mg Oral Q8H   multivitamin  1 tablet Oral QHS   mouth rinse  15 mL Mouth Rinse 4 times per day   risperiDONE   1 mg Oral QHS   sodium chloride  flush  10-40 mL Intracatheter Q12H   vancomycin   125 mg Per Tube Daily   Or   vancomycin   125 mg Oral Daily     Dialysis Orders: [facility]  B400   96.1kg  TDC   Hep none Last OP HD 1/08, post wt 100kg  Assessment/Plan: ESRD - on HD TTS OP -s/p CRRT 1/10-1/13 given significant shock - Next HD 1/23 d/t plan for line holiday. Patient will need labs done tomorrow morning with HD. See below   PAfib -cardiology following, on IV amio   Septic shock/ hypotension - s/p pressors - getting po midodrine , see below   Sepsis -secondary to klebsiella pn bacteremia (ESBL) -on meropenem  and vanc -also on po vanc -per primary service -Repeat bcx did not clear so will need a line holiday. Plan for HD 1/23 then [facility] removal afterwards for a line holiday. I consulted IR today. Tentative plan for new [facility] on Monday (1/26). This plan may need adjustment based on labs and patient's volume. Monitor both closely.   Volume - UF as tolerated with midodrine  support -Bps remain low and I raised Midodrine  to 20mg  TID for BP support. Okay to give an extra 20mg  dose mid-run with HD if needed. Order IV Alb for additional BP support   Anemia of esrd - follow, transfuse prn  -avoid IV Fe for now, started aranesp  1/13 -hgb 8.1   Diarrhea - getting empiric po vanc - had diarrhea and concern for Cdif, neg testing 1/10   Wounds -mgmt per primary service   Dispo/GOC -Inpatient -She doesn't look good and she started crying this morning when I explained the plan with her TDC. She states: this is too much. I reached out to Palliative who is closely following. Appreciate Palliative's assistance.  [name], NP [facility] 03/29/2024,10:38 AM  LOS: 13 days    "

## 2024-03-29 NOTE — Progress Notes (Addendum)
 "       Subjective:  He was having more pain under Deborah [name] arms Deborah [name] was also very anxious about Deborah [name] mortality today when I came to visit Deborah [name]  Antibiotics:  Anti-infectives (From admission, onward)    Start     Dose/Rate Route Frequency Ordered Stop   04/22/24 1000  vancomycin  (VANCOCIN ) 50 mg/mL oral solution SOLN 125 mg  Status:  Discontinued       Placed in Followed by Linked Group   125 mg Per Tube Every 3 DAYS 03/17/24 1020 03/17/24 1332   04/14/24 1000  vancomycin  (VANCOCIN ) 50 mg/mL oral solution SOLN 125 mg  Status:  Discontinued       Placed in Followed by Linked Group   125 mg Per Tube Every other day 03/17/24 1020 03/17/24 1332   04/07/24 1000  vancomycin  (VANCOCIN ) 50 mg/mL oral solution SOLN 125 mg  Status:  Discontinued       Placed in Followed by Linked Group   125 mg Per Tube Daily 03/17/24 1020 03/17/24 1332   03/31/24 1000  vancomycin  (VANCOCIN ) 50 mg/mL oral solution SOLN 125 mg  Status:  Discontinued       Placed in Followed by Linked Group   125 mg Per Tube 2 times daily 03/17/24 1020 03/17/24 1332   03/28/24 1800  fluconazole  (DIFLUCAN ) tablet 200 mg        200 mg Oral Weekly 03/28/24 1206     03/21/24 2200  meropenem  (MERREM ) 1 g in sodium chloride  0.9 % 100 mL IVPB  Status:  Discontinued        1 g 200 mL/hr over 30 Minutes Intravenous Every 24 hours 03/20/24 1041 03/20/24 1153   03/20/24 2200  meropenem  (MERREM ) 1 g in sodium chloride  0.9 % 100 mL IVPB        1 g 200 mL/hr over 30 Minutes Intravenous Every 24 hours 03/20/24 1153 03/30/24 2359   03/19/24 1200  vancomycin  (VANCOCIN ) IVPB 1000 mg/200 mL premix  Status:  Discontinued        1,000 mg 200 mL/hr over 60 Minutes Intravenous  Once 03/19/24 0907 03/19/24 0907   03/19/24 1200  vancomycin  (VANCOCIN ) IVPB 1000 mg/200 mL premix  Status:  Discontinued        1,000 mg 200 mL/hr over 60 Minutes Intravenous Every 24 hours 03/19/24 0907 03/19/24 1110   03/17/24 2200  ceFEPIme  (MAXIPIME ) 2 g in sodium  chloride 0.9 % 100 mL IVPB  Status:  Discontinued        2 g 200 mL/hr over 30 Minutes Intravenous Every 24 hours 03/17/24 0142 03/17/24 0732   03/17/24 1445  meropenem  (MERREM ) 1 g in sodium chloride  0.9 % 100 mL IVPB  Status:  Discontinued        1 g 200 mL/hr over 30 Minutes Intravenous Every 8 hours 03/17/24 1354 03/20/24 1153   03/17/24 1445  vancomycin  (VANCOCIN ) 50 mg/mL oral solution SOLN 125 mg       Placed in Or Linked Group   125 mg Per Tube Daily 03/17/24 1354 03/31/24 2359   03/17/24 1445  vancomycin  (VANCOCIN ) capsule 125 mg       Placed in Or Linked Group   125 mg Oral Daily 03/17/24 1354 03/31/24 2359   03/17/24 1445  vancomycin  (VANCOCIN ) IVPB 1000 mg/200 mL premix  Status:  Discontinued        1,000 mg 200 mL/hr over 60 Minutes Intravenous Every 24 hours 03/17/24 1354 03/18/24 0923   03/17/24 1430  fidaxomicin  (DIFICID ) tablet 200 mg  Status:  Discontinued        200 mg Tube 2 times daily 03/17/24 1332 03/17/24 1351   03/17/24 1115  vancomycin  (VANCOCIN ) capsule 125 mg  Status:  Discontinued        125 mg Oral 4 times daily 03/17/24 1020 03/17/24 1354   03/17/24 1115  vancomycin  (VANCOCIN ) 50 mg/mL oral solution SOLN 125 mg  Status:  Discontinued       Placed in Followed by Linked Group   125 mg Per Tube 4 times daily 03/17/24 1020 03/17/24 1332   03/17/24 1000  meropenem  (MERREM ) 500 mg in sodium chloride  0.9 % 100 mL IVPB  Status:  Discontinued        500 mg 200 mL/hr over 30 Minutes Intravenous Every 24 hours 03/17/24 0732 03/17/24 1354   03/17/24 0145  metroNIDAZOLE  (FLAGYL ) IVPB 500 mg  Status:  Discontinued        500 mg 100 mL/hr over 60 Minutes Intravenous 2 times daily 03/17/24 0140 03/17/24 0732   03/16/24 2114  vancomycin  variable dose per unstable renal function (pharmacist dosing)  Status:  Discontinued         Does not apply See admin instructions 03/16/24 2114 03/17/24 1354   03/16/24 1945  ceFEPIme  (MAXIPIME ) 2 g in sodium chloride  0.9 % 100 mL  IVPB        2 g 200 mL/hr over 30 Minutes Intravenous  Once 03/16/24 1934 03/16/24 2034   03/16/24 1930  vancomycin  (VANCOREADY) IVPB 2000 mg/400 mL        2,000 mg 200 mL/hr over 120 Minutes Intravenous  Once 03/16/24 1918 03/16/24 2247   03/16/24 1930  piperacillin -tazobactam (ZOSYN ) IVPB 4.5 g  Status:  Discontinued        4.5 g 200 mL/hr over 30 Minutes Intravenous  Once 03/16/24 1918 03/16/24 1934       Medications: Scheduled Meds:  amiodarone   400 mg Oral BID   Followed by   NOREEN ON 04/01/2024] amiodarone   200 mg Oral Daily   ascorbic acid   500 mg Oral BID   atorvastatin   10 mg Oral Daily   busPIRone   5 mg Oral TID   collagenase    Topical Daily   darbepoetin (ARANESP ) injection - DIALYSIS  150 mcg Subcutaneous Q Tue-1800   dextrose   1 Tube Oral STAT   famotidine   20 mg Oral QHS   feeding supplement  1 Container Oral Q24H   fluconazole   200 mg Oral Weekly   heparin  sodium (porcine)  1.9 mL Intracatheter Once   insulin  aspart  0-5 Units Subcutaneous QHS   insulin  aspart  0-9 Units Subcutaneous TID WC   insulin  glargine  5 Units Subcutaneous QHS   midodrine   20 mg Oral Q8H   multivitamin  1 tablet Oral QHS   mouth rinse  15 mL Mouth Rinse 4 times per day   risperiDONE   1 mg Oral QHS   sodium chloride  flush  10-40 mL Intracatheter Q12H   vancomycin   125 mg Per Tube Daily   Or   vancomycin   125 mg Oral Daily   Continuous Infusions:  albumin  human 25 g (03/29/24 0949)   meropenem  (MERREM ) IV 1 g (03/28/24 2313)   PRN Meds:.acetaminophen , HYDROmorphone , hydrOXYzine , mouth rinse, oxyCODONE  **OR** [DISCONTINUED] oxyCODONE , polyethylene glycol, senna    Objective: Weight change: 5.1 kg  Intake/Output Summary (Last 24 hours) at 03/29/2024 1526 Last data filed at 03/29/2024 0730 Gross per 24 hour  Intake 20  ml  Output --  Net 20 ml   Blood pressure 101/82, pulse (!) 115, temperature 98 F (36.7 C), temperature source Oral, resp. rate 20, height 5' 6 (1.676 m),  weight 106.6 kg, last menstrual period 10/10/2012, SpO2 90%. Temp:  [97.8 F (36.6 C)-99.9 F (37.7 C)] 98 F (36.7 C) (01/22 1159) Pulse Rate:  [87-171] 115 (01/22 1159) Resp:  [11-20] 20 (01/22 1159) BP: (78-101)/(34-82) 101/82 (01/22 1159) SpO2:  [71 %-100 %] 90 % (01/22 1159) Weight:  [106.6 kg] 106.6 kg (01/22 0500)  Physical Exam: Physical Exam Exam conducted with a chaperone present.  Constitutional:      Appearance: Deborah [name] is well-developed. Deborah [name] is not diaphoretic.  HENT:     Head: Normocephalic and atraumatic.     Right Ear: External ear normal.     Left Ear: External ear normal.     Mouth/Throat:     Pharynx: No oropharyngeal exudate.  Eyes:     General: No scleral icterus.    Conjunctiva/sclera: Conjunctivae normal.     Pupils: Pupils are equal, round, and reactive to light.  Cardiovascular:     Rate and Rhythm: Normal rate and regular rhythm.  Pulmonary:     Effort: Pulmonary effort is normal. No respiratory distress.     Breath sounds: No wheezing.  Abdominal:     General: There is no distension.     Palpations: Abdomen is soft.  Musculoskeletal:        General: No tenderness. Normal range of motion.  Lymphadenopathy:     Cervical: No cervical adenopathy.  Skin:    General: Skin is warm and dry.     Coloration: Skin is not pale.     Findings: Erythema and rash present.  Neurological:     General: No focal deficit present.     Mental Status: Deborah [name] is alert and oriented to person, place, and time.     Motor: No abnormal muscle tone.     Coordination: Coordination normal.  Psychiatric:        Mood and Affect: Mood is anxious and depressed. Affect is tearful.        Behavior: Behavior normal.        Thought Content: Thought content normal.        Judgment: Judgment normal.    Skin today     Right armpit     CBC:    BMET Recent Labs    03/27/24 1327 03/29/24 1230  NA 139 138  K 3.7 3.8  CL 105 102  CO2 24 24  GLUCOSE 162* 163*  BUN 30*  22*  CREATININE 4.33* 3.84*  CALCIUM  7.1* 7.4*     Liver Panel  Recent Labs    03/27/24 1327 03/29/24 1230  ALBUMIN  1.7* 1.9*       Sedimentation Rate No results for input(s): ESRSEDRATE in the last 72 hours. C-Reactive Protein No results for input(s): CRP in the last 72 hours.  Micro Results: Recent Results (from the past 720 hours)  Blood culture (routine x 2)     Status: Abnormal   Collection Time: 03/16/24  6:00 PM   Specimen: BLOOD  Result Value Ref Range Status   Specimen Description BLOOD RIGHT ANTECUBITAL  Final   Special Requests   Final    BOTTLES DRAWN AEROBIC AND ANAEROBIC Blood Culture adequate volume   Culture  Setup Time   Final    GRAM NEGATIVE RODS IN BOTH AEROBIC AND ANAEROBIC BOTTLES CRITICAL RESULT CALLED TO, READ BACK  BY AND VERIFIED WITH: PHARMD ABBOTT 03/17/2024 @ 0721 BY AB    Culture (A)  Final    KLEBSIELLA PNEUMONIAE BACILLUS SPECIES Standardized susceptibility testing for this organism is not available. CRITICAL RESULT CALLED TO, READ BACK BY AND VERIFIED WITH: MAYA SKATES PAYTES [id number] AT 1832 BY EC Performed at [facility], [address], [place], [place] [address]    Report Status 03/19/2024 FINAL  Final  Blood Culture ID Panel (Reflexed)     Status: Abnormal   Collection Time: 03/16/24  6:00 PM  Result Value Ref Range Status   Enterococcus faecalis NOT DETECTED NOT DETECTED Final   Enterococcus Faecium NOT DETECTED NOT DETECTED Final   Listeria monocytogenes NOT DETECTED NOT DETECTED Final   Staphylococcus species NOT DETECTED NOT DETECTED Final   Staphylococcus aureus (BCID) NOT DETECTED NOT DETECTED Final   Staphylococcus epidermidis NOT DETECTED NOT DETECTED Final   Staphylococcus lugdunensis NOT DETECTED NOT DETECTED Final   Streptococcus species NOT DETECTED NOT DETECTED Final   Streptococcus agalactiae NOT DETECTED NOT DETECTED Final   Streptococcus pneumoniae NOT DETECTED NOT DETECTED Final    Streptococcus pyogenes NOT DETECTED NOT DETECTED Final   A.calcoaceticus-baumannii NOT DETECTED NOT DETECTED Final   Bacteroides fragilis NOT DETECTED NOT DETECTED Final   Enterobacterales DETECTED (A) NOT DETECTED Final    Comment: Enterobacterales represent a large order of gram negative bacteria, not a single organism. CRITICAL RESULT CALLED TO, READ BACK BY AND VERIFIED WITH: PHARMD ABBOTT 03/17/2024 @ 0721 BY AB    Enterobacter cloacae complex NOT DETECTED NOT DETECTED Final   Escherichia coli NOT DETECTED NOT DETECTED Final   Klebsiella aerogenes NOT DETECTED NOT DETECTED Final   Klebsiella oxytoca NOT DETECTED NOT DETECTED Final   Klebsiella pneumoniae DETECTED (A) NOT DETECTED Final    Comment: CRITICAL RESULT CALLED TO, READ BACK BY AND VERIFIED WITH: PHARMD ABBOTT 03/17/2024 @ 0721 BY AB    Proteus species NOT DETECTED NOT DETECTED Final   Salmonella species NOT DETECTED NOT DETECTED Final   Serratia marcescens NOT DETECTED NOT DETECTED Final   Haemophilus influenzae NOT DETECTED NOT DETECTED Final   Neisseria meningitidis NOT DETECTED NOT DETECTED Final   Pseudomonas aeruginosa NOT DETECTED NOT DETECTED Final   Stenotrophomonas maltophilia NOT DETECTED NOT DETECTED Final   Candida albicans NOT DETECTED NOT DETECTED Final   Candida auris NOT DETECTED NOT DETECTED Final   Candida glabrata NOT DETECTED NOT DETECTED Final   Candida krusei NOT DETECTED NOT DETECTED Final   Candida parapsilosis NOT DETECTED NOT DETECTED Final   Candida tropicalis NOT DETECTED NOT DETECTED Final   Cryptococcus neoformans/gattii NOT DETECTED NOT DETECTED Final   CTX-M ESBL DETECTED (A) NOT DETECTED Final    Comment: CRITICAL RESULT CALLED TO, READ BACK BY AND VERIFIED WITH: PHARMD ABBOTT 03/17/2024 @ 0721 BY AB (NOTE) Extended spectrum beta-lactamase detected. Recommend a carbapenem as initial therapy.      Carbapenem resistance IMP NOT DETECTED NOT DETECTED Final   Carbapenem resistance KPC  NOT DETECTED NOT DETECTED Final   Carbapenem resistance NDM NOT DETECTED NOT DETECTED Final   Carbapenem resist OXA 48 LIKE NOT DETECTED NOT DETECTED Final   Carbapenem resistance VIM NOT DETECTED NOT DETECTED Final    Comment: Performed at [facility], [address], [place], [place] [address]  Blood culture (routine x 2)     Status: Abnormal   Collection Time: 03/16/24  7:49 PM   Specimen: BLOOD  Result Value Ref Range Status  Specimen Description BLOOD RIGHT ANTECUBITAL  Final   Special Requests   Final    BOTTLES DRAWN AEROBIC AND ANAEROBIC Blood Culture results may not be optimal due to an inadequate volume of blood received in culture bottles   Culture  Setup Time   Final    GRAM NEGATIVE RODS IN BOTH AEROBIC AND ANAEROBIC BOTTLES CRITICAL VALUE NOTED.  VALUE IS CONSISTENT WITH PREVIOUSLY REPORTED AND CALLED VALUE. Performed at [facility], [address] [place], [place] [address]    Culture (A)  Final    KLEBSIELLA PNEUMONIAE Confirmed Extended Spectrum Beta-Lactamase Producer (ESBL).  In bloodstream infections from ESBL organisms, carbapenems are preferred over piperacillin /tazobactam. They are shown to have a lower risk of mortality.    Report Status 03/19/2024 FINAL  Final   Organism ID, Bacteria KLEBSIELLA PNEUMONIAE  Final      Susceptibility   Klebsiella pneumoniae - MIC*    AMPICILLIN >=32 RESISTANT Resistant     CEFAZOLIN  (NON-URINE) >=32 RESISTANT Resistant     CEFEPIME  >=32 RESISTANT Resistant     ERTAPENEM <=0.12 SENSITIVE Sensitive     CEFTRIAXONE  >=64 RESISTANT Resistant     CIPROFLOXACIN  >=4 RESISTANT Resistant     GENTAMICIN <=1 SENSITIVE Sensitive     MEROPENEM  <=0.25 SENSITIVE Sensitive     TRIMETH/SULFA >=320 RESISTANT Resistant     AMPICILLIN/SULBACTAM >=32 RESISTANT Resistant     PIP/TAZO Value in next row Intermediate      64 INTERMEDIATEThis is a modified FDA-approved test that has been validated and its performance characteristics  determined by the reporting laboratory.  This laboratory is certified under the Clinical Laboratory Improvement Amendments CLIA as qualified to perform high complexity clinical laboratory testing.    * KLEBSIELLA PNEUMONIAE  MRSA Next Gen by PCR, Nasal     Status: None   Collection Time: 03/17/24  1:10 AM   Specimen: Nasal Mucosa; Nasal Swab  Result Value Ref Range Status   MRSA by PCR Next Gen NOT DETECTED NOT DETECTED Final    Comment: (NOTE) The GeneXpert MRSA Assay (FDA approved for NASAL specimens only), is one component of a comprehensive MRSA colonization surveillance program. It is not intended to diagnose MRSA infection nor to guide or monitor treatment for MRSA infections. Test performance is not FDA approved in patients less than 38 years old. Performed at [facility], [address], [place], [place] [address]   C Difficile Quick Screen w PCR reflex     Status: None   Collection Time: 03/17/24 10:19 AM   Specimen: STOOL  Result Value Ref Range Status   C Diff antigen NEGATIVE NEGATIVE Final   C Diff toxin NEGATIVE NEGATIVE Final   C Diff interpretation No C. difficile detected.  Final    Comment: Performed at [facility], [address] [place], [place] [address]    Studies/Results: No results found.    Assessment/Plan:  INTERVAL HISTORY: patient seen by palliative care   Principal Problem:   Syncope Active Problems:   Atrial fibrillation with RVR (HCC)-resolved   Hypotension   Bacteremia due to Klebsiella pneumoniae   Encephalopathy acute   Septic shock (HCC)   Encounter for continuous renal replacement therapy (CRRT) for acute renal failure   ESRD (end stage renal disease) (HCC)   Non-healing open wound of left groin   Sepsis due to Klebsiella (HCC)   ESBL (extended spectrum beta-lactamase) producing bacteria infection   Hemodialysis catheter infection   Bullous rash  Exfoliative dermatitis   Bacteremia    Deborah [name] is a  59 y.o. female with  ESBL E coli bacteremia with HD line in place, ongoing fevers, leukocytosis, and exfolatiative dermatitis that was reportedly present prior to abx being started  #1 ESBL bacteremia  The line holiday is the most important part of getting this cured  I am less concerned re Deborah [name] thigh, though with Deborah [name] body habitus this can be challenging  Considering MRI without contrast  Repeat blood cultures post HD catheter removal  Could Deborah [name] receive invanz with HD as part of treatment  #2 Extensive folates of rash and wounds reportedly present on admission potentially related to Deborah [name] sepsis.  I certainly hope they are not related to the antibiotic them understand they do not.  #3 ESRD on HD see above  #4 Intertrigo: Continue fluconazole  and topical antifungals  #5 Goals of care: Patient is being seen by palliative care and currently desires aggressive care according to the notes though there is some uncertainty on the part of the patient.  Today Deborah [name] is seen very anxious about Deborah [name] mortality  #6 I hx of C difficile. On vancomycin  prophylaxis  I personally spent a total of 50 minutes in the care of the patient today including preparing to see the patient, getting/reviewing separately obtained history, performing a medically appropriate exam/evaluation, counseling and educating, placing orders, documenting clinical information in the EHR, and independently interpreting results.   Evaluation of the patient requires complex antimicrobial therapy evaluation, counseling , isolation needs to reduce disease transmission and risk assessment and mitigation.      LOS: 13 days   Jomarie Fleeta Rothman 03/29/2024, 3:26 PM  "

## 2024-03-29 NOTE — Progress Notes (Signed)
 Nutrition Follow-up  DOCUMENTATION CODES:   Obesity unspecified  INTERVENTION:   Continue snacks BID between meals. Continue Boost Breeze po once daily, each supplement provides 250 kcal and 9 grams of protein Continue 500 mg vitamin C  BID Continue renal MVI daily Continue regular diet for widest variety of meal selections   NUTRITION DIAGNOSIS:   Moderate Malnutrition related to acute illness as evidenced by mild fat depletion, moderate muscle depletion, edema.  Ongoing  GOAL:   Patient will meet greater than or equal to 90% of their needs  Progressing   MONITOR:   Diet advancement, TF tolerance, Skin, Weight trends, Labs, I & O's  REASON FOR ASSESSMENT:   Consult Assessment of nutrition requirement/status  ASSESSMENT:   59 y/o female with h/o HLD, stroke, fibromyalgia, NICM, HTN, IDDM, gastroparesis, cholecystectomy, substance abuse, gout, GERD, anxiety, PUD, C diff, NASH, duodenal carcinoid s/p resection (2022), chronic pain, PAF, CHF, ESRD on HD, gastric AVM, PVD s/p R BKA, TD and recently hospitalized 02/14/2024-02/25/2024 for sepsis secondary to left thigh abscess and underwent I&D with gen surg 02/16/2024 and who is anow admitted with AMS, syncope, septic shock and K pneumoniae bacteremia.  1/10- CRRT initiated 1/13- CRRT d/c 1/15- refused HD secondary to pain 1/14- TF stopped 1/16 cortrak d/c  1/17- rectal tube d/c, initially refused HD, but agreed to go 1/22- PIV unable to be completed due to poor vasculature  Reviewed I/O's: +250 ml x 24 hours +3.1 L since admission  Per chart review, patient often refusing care, such as wound care and turns.   Patient with bacteremia. Per nephrology notes, plan for HD on 03/30/24 and then [facility] removal for line holiday. Potential plan for new [facility] placement on Monday /1/26).   Patient sitting up in bed, reviewing menu at time of visit. Patient is tearful and upset, stating that she was initially on a regular diet and this  was changed to a renal diet. Patient complains that she does not like the foods on the renal diet and will refuse to eat unless her diet is liberalized. Prior to diet change, patient reports consuming most of her meals and supplements. Noted meal completion 50-75%. RD liberalized diet per request and care plan discussed with RN.   Reviewed weight history; weight have ranged from 101.5-106.6 kg over the past 7 days. Patient is currently above EDW of 96.1 kg. Patient +3.1 L since admission.   Palliative care following for goals of care discussions; plan for full code/ full scope at this time.   Medications reviewed and include vitamin C , aranesp , diflucan , and midodrine .   Labs reviewed: CBGS: [id number] (inpatient orders for glycemic control are 0-5 units insulin  aspart daily at bedtime, 0-9 units insulin  aspart TID with meals, and 5 units insulin  glargine daily).    Diet Order:   Diet Order             Diet regular Room service appropriate? Yes; Fluid consistency: Thin  Diet effective now                   EDUCATION NEEDS:   Not appropriate for education at this time  Skin:  Skin Assessment: Skin Integrity Issues: (Stage 3 PI b/l medial buttocks, full thickness L groin s/p I&D 02/16/2024, Stage 2 Pressure Injury L posterior thigh pink) Skin Integrity Issues:: Stage II, Unstageable, Stage III Stage II: abdomen, R hip, L thigh Stage III: R buttocks Unstageable: L foot  Last BM:  03/28/24  Height:   Ht Readings from  Last 1 Encounters:  03/28/24 5' 6 (1.676 m)    Weight:   Wt Readings from Last 1 Encounters:  03/29/24 106.6 kg    Ideal Body Weight:  59 kg  BMI:  Body mass index is 37.93 kg/m.  Estimated Nutritional Needs:   Kcal:  1700-1900  Protein:  80-90 gm  Fluid:  UOP +1L    Margery ORN, RD, LDN, CDCES Registered Dietitian III Certified Diabetes Care and Education Specialist If unable to reach this RD, please use RD Inpatient group chat on secure chat  between hours of 8am-4 pm daily

## 2024-03-29 NOTE — Progress Notes (Addendum)
 "                                                                                                                                                                                                                                                                                PROGRESS NOTE     Patient Demographics:    Deborah [name], is a 59 y.o. female, DOB - 08/09/1965, FMW:[id number]  Outpatient Primary MD for the patient is Pcp, No    LOS - 13  Admit date - 03/16/2024    Chief Complaint  Patient presents with   Loss of Consciousness       Brief Narrative (HPI from H&P)     59 yo female with past medical history significant for PAF, HLD, right BKA, ESRD on HD (TTS) who presented from SNF to [facility] after syncopal episode with +LOC. Patient hemodynamically unstable in ED despite fluid resuscitation with 3L crystalloid and was subsequently started on vasopressors. Labs notable for metabolic acidosis, sCr 7.04, BUN 23, iCa 1.00, troponin 105, lactic acic 9.0, given 1 amp HCO3. On exam patient with diffuse abdominal pain, exam somewhat limited as she has chronic pain managed daily with Dilaudid . Patient started empirically on Cefepime , Vanc, Flagyl ; CT A/P pending, and syncope workup in progress. PCCM consulted for ICU admission.   Of note, patient recently hospitalized 02/14/2024-02/25/2024 for sepsis secondary to left thigh abscess and underwent I&D with gen surg 02/16/2024.  She was diagnosed with sepsis and subsequently Klebsiella bacteremia she was placed on IV meropenem , due to low blood pressures and underlying ESRD she was seen by nephrology and underwent multiple sessions of CRRT in ICU, she was also placed on vancomycin  prophylactically to prevent reoccurrence of C. difficile.  She was somewhat stabilized and transferred to my care on 03/22/2024 on day 6 of her hospital stay, currently blood pressure in 80s, still has NG tube, minimally responsive with generalized pain including abdominal  pain.   Significant Hospital Events: Including procedures, antibiotic start and stop dates in addition to other pertinent events   1/9 ED s/p witnessed syncopal event w/ +LOC in shock, started on Levo/Vaso; Aline/CVC placed 1/10.  CT abdomen pelvis in the ER.  Nonacute.   1/10 CRRT, still on vaso, NE. Esclated to meropenem  for ESBL Klebsiella 1/11 echocardiogram EF 60%.  No acute changes 1/12: off levophed , on vasopressin  1/13: off vasopressin , steroids, and CRRT 1/14: transfer out of ICU, TRH to pick up 1/15    Subjective:   Patient in bed, no apparent distress, she denies any complaints this morning   Assessment  & Plan :    Septic shock due to Klebsiella pneumoniae bacteremia - Source of bacteremia is unclear. - Remains with significant leukocytosis, intermittent fever. - ID input greatly appreciated, she will need line holiday, discussed with IR, nephrology team, plan to HD tomorrow and line removal after that and line holiday over the weekend. - Antibiotics management per ID.  Will continue with meropenem  - Follow on repeat blood cultures. - CT abdomen pelvis negative, follow chest x-ray, on IV meropenem  . - With significant rash, crusting, unclear allergy     ESRD on HD (TTS) - -management per renal, for HD tomorrow per renal  Chronic hypotension -Profoundly hypotensive initially requiring pressors, currently on high-dose midodrine  20 mg p.o. 3 times daily, remains hypotensive, will start on albumin .    Opioid-use disorder Hx substance abuse - as an OP following with [facility], minimize narcotics.  Chronic pain, Hx fibromyalgia, Acute on chronic abdominal pain - continue supportive care counseled to minimize narcotic intake as blood pressure at baseline is very low.   Anemia of chronic disease   -drop in Hb may be dilutional. No evidence of DIC or bleeding, monitor   History of Cdiff  -  cdiff negative,  on prophylactic dose PO vanc   Syncope; suspect  due to sepsis   - echo: EF 60-605%, mild LVH, small pericardial effusion posteriorly    Troponin elevation; likely demand d/t shock state, PAF -not on anticoagulation due to past history of GI bleed, troponin elevation due to demand mismatch from sepsis, trend is flat and in non-ACS pattern, echo shows preserved EF and no wall motion abnormality.  Chest pain-free no further workup.  She developed RVR with hypotension for which she was seen by cardiology and started on amiodarone  drip on 03/23/2024. -Heart rate is up again today, so we will give IV amiodarone  load   HLD   - PTA statin   Hx duodenal carcinoid tumor s/p resection (2022), Hx gastric ulcer & gastritis (2024) - Chronic gastroparesis  -con't H2-blocker; stable.   S/p I&D for left thigh abscess 02/16/24.   - Stable on CT scan, orthopedic input greatly appreciated   Wounds present on admission - left thigh, R hip, lower abdomen, buttocks, L heel. turns, WOC as needed Dr. Harden to evaluate left thigh postop site.  PVD s/p R BKA- stump is well healed   Sacral pressure injury - turns, unfortunately needs pressors, hard to keep that skin clear with frequent diarrhea   Possible vaginal yeast infection  -Clotrimazole  cream daily x7 days   Anxiety/Depression -Resume home buspar , risperidone , trazodone  when appropriate> waiting for mental status to normalize some   DM2; hypoglycemia from sepsis corrected with steroids - On sliding scale, low-dose Semglee , CBGs low overnight requiring D5W,  Lab Results  Component Value Date   HGBA1C 4.3 (L) 02/15/2024   CBG (last 3)  Recent Labs    03/29/24 0408 03/29/24 0501 03/29/24 0843  GLUCAP 67* 83 122*    Difficult stick -Patient is extremely difficult stick, with multiple attempts by IV team for access and blood draws with no success, I have  discussed with renal, okay to draw labs today from her tunneled dialysis catheter by IV team as it is planned to be discontinued tomorrow and her  labs are essential for tomorrow's dialysis and plan.        Condition - Extremely Guarded  Family Communication  : None present  Code Status : Full code  Consults  : PCCM, nephrology, orthopedic, ID  PUD Prophylaxis :    Procedures  :           Disposition Plan  :    Status is: Inpatient   DVT Prophylaxis  :    Place and maintain sequential compression device Start: 03/20/24 1800  Lab Results  Component Value Date   PLT 370 03/27/2024    Diet :  Diet Order             Diet renal with fluid restriction Fluid restriction: 1200 mL Fluid; Room service appropriate? Yes; Fluid consistency: Thin  Diet effective now                    Inpatient Medications  Scheduled Meds:  amiodarone   150 mg Intravenous Once   amiodarone   400 mg Oral BID   Followed by   NOREEN ON 04/01/2024] amiodarone   200 mg Oral Daily   ascorbic acid   500 mg Oral BID   atorvastatin   10 mg Oral Daily   busPIRone   5 mg Oral TID   collagenase    Topical Daily   darbepoetin (ARANESP ) injection - DIALYSIS  150 mcg Subcutaneous Q Tue-1800   dextrose   1 Tube Oral STAT   famotidine   20 mg Oral QHS   feeding supplement  1 Container Oral Q24H   fluconazole   200 mg Oral Weekly   insulin  aspart  0-5 Units Subcutaneous QHS   insulin  aspart  0-9 Units Subcutaneous TID WC   insulin  glargine  5 Units Subcutaneous QHS   midodrine   20 mg Oral Q8H   multivitamin  1 tablet Oral QHS   mouth rinse  15 mL Mouth Rinse 4 times per day   risperiDONE   1 mg Oral QHS   sodium chloride  flush  10-40 mL Intracatheter Q12H   vancomycin   125 mg Per Tube Daily   Or   vancomycin   125 mg Oral Daily   Continuous Infusions:  albumin  human 25 g (03/29/24 0949)   meropenem  (MERREM ) IV 1 g (03/28/24 2313)   PRN Meds:.acetaminophen , HYDROmorphone , hydrOXYzine , mouth rinse, oxyCODONE  **OR** [DISCONTINUED] oxyCODONE , polyethylene glycol, senna    Objective:   Vitals:   03/29/24 1001 03/29/24 1002 03/29/24 1003  03/29/24 1004  BP:      Pulse: (!) 111 (!) 108 (!) 122 (!) 108  Resp:      Temp:      TempSrc:      SpO2: 95% 96% 95% 96%  Weight:      Height:        Wt Readings from Last 3 Encounters:  03/29/24 106.6 kg  02/25/24 97.2 kg  06/20/23 114.5 kg    No intake or output data in the 24 hours ending 03/29/24 1014     Physical Exam  Frail, ill-appearing, deconditioned  Good air entry Regular rate and rhythm Abdomen soft Left groin wound bandaged Right BKA  Skin with diffuse desquamating rash      RN pressure injury documentation: Wound 02/19/24 0200 Pressure Injury Foot Left;Lateral Unstageable - Full thickness tissue loss in which the base of the injury is covered by  slough (yellow, tan, gray, green or brown) and/or eschar (tan, brown or black) in the wound bed. (Active)     Wound 03/17/24 0045 Pressure Injury Thigh Left;Posterior Stage 2 -  Partial thickness loss of dermis presenting as a shallow open injury with a red, pink wound bed without slough. (Active)     Wound 03/17/24 0045 Pressure Injury Hip Lateral;Right Stage 2 -  Partial thickness loss of dermis presenting as a shallow open injury with a red, pink wound bed without slough. (Active)     Wound 03/17/24 0045 Pressure Injury Abdomen Lower;Left Stage 2 -  Partial thickness loss of dermis presenting as a shallow open injury with a red, pink wound bed without slough. (Active)     Wound 03/17/24 1119 Pressure Injury Buttocks Right;Left;Medial Stage 3 -  Full thickness tissue loss. Subcutaneous fat may be visible but bone, tendon or muscle are NOT exposed. (Active)      Data Review:    Recent Labs  Lab 03/24/24 0730 03/27/24 1328  WBC 16.8* 23.2*  HGB 7.7* 8.1*  HCT 24.2* 24.9*  PLT 218 370  MCV 98.0 97.6  MCH 31.2 31.8  MCHC 31.8 32.5  RDW 20.5* 19.8*  LYMPHSABS 1.9 2.0  MONOABS 0.9 1.7*  EOSABS 0.2 0.2  BASOSABS 0.0 0.1    Recent Labs  Lab 03/24/24 0730 03/24/24 1618 03/27/24 1327  NA 136 135  139  K 3.9 3.6 3.7  CL 103 101 105  CO2 23 24 24   ANIONGAP 10 10 10   GLUCOSE 87 139* 162*  BUN 37* 19 30*  CREATININE 4.11* 2.33* 4.33*  ALBUMIN   --  1.8* 1.7*  MG  --  1.9 2.1  PHOS  --  2.9 4.5  CALCIUM  6.9* 7.1* 7.1*      Recent Labs  Lab 03/24/24 0730 03/24/24 1618 03/27/24 1327  MG  --  1.9 2.1  CALCIUM  6.9* 7.1* 7.1*    --------------------------------------------------------------------------------------------------------------- Lab Results  Component Value Date   CHOL 104 07/22/2019   HDL 30 (L) 07/22/2019   LDLCALC 37 07/22/2019   TRIG 185 (H) 07/22/2019   CHOLHDL 3.5 07/22/2019    Lab Results  Component Value Date   HGBA1C 4.3 (L) 02/15/2024   No results for input(s): TSH, T4TOTAL, FREET4, T3FREE, THYROIDAB in the last 72 hours. No results for input(s): VITAMINB12, FOLATE, FERRITIN, TIBC, IRON , RETICCTPCT in the last 72 hours. ------------------------------------------------------------------------------------------------------------------ Cardiac Enzymes No results for input(s): CKMB, TROPONINI, MYOGLOBIN in the last 168 hours.  Invalid input(s): CK  Radiology Report No results found.    Signature  -   Brayton Lye M.D on 03/29/2024 at 10:14 AM   -  To page go to [email or web address]   "

## 2024-03-29 NOTE — Progress Notes (Signed)
" °   03/29/24 0844  Vitals  Temp 97.8 F (36.6 C)  Temp Source Oral  BP (!) 81/59  MAP (mmHg) 66  Pulse Rate (!) 109  ECG Heart Rate (!) 124  Resp 20  Level of Consciousness  Level of Consciousness Alert  MEWS COLOR  MEWS Score Color Yellow  Oxygen Therapy  SpO2 99 %  O2 Device Room Air  MEWS Score  MEWS Temp 0  MEWS Systolic 1  MEWS Pulse 2  MEWS RR 0  MEWS LOC 0  MEWS Score 3   Provider aware "

## 2024-03-29 NOTE — Progress Notes (Signed)
 DBIV consult: Order received from [facility], nephrology NP to draw labs from HD cath. Pt initially not agreeable to labs from cath. Returned and discussed further with pt, she agreed to accessing her [facility]. Labs drawn.

## 2024-03-29 NOTE — Progress Notes (Signed)
 "                                                                                                                                                         Daily Progress Note   Patient Name: Deborah [name]       Date: 03/29/2024 DOB: 15-Jun-1965  Age: 59 y.o. MRN#: [id number] Attending Physician: Sherlon Brayton RAMAN, MD Primary Care Physician: Pcp, No Admit Date: 03/16/2024  Reason for Consultation/Follow-up: Establishing goals of care  Subjective: Patient stated she was doing good upon my arrival and inquiry about her current symptoms.  She also stated she did not know what her current care plan was or recent updates regarding her labs.  With her permission, I provide an update on blood culture results indicating that she has not yet cleared her bacteremia and next plans for line holiday after dialysis.  She became emotional quickly and said I cannot keep doing this.  Emotional support and therapeutic listening was provided.    I readdressed goals of care in light of her statement.  She acknowledges previous discussions and longstanding indecision when she has reached these difficult points in the past.  I recommended a family meeting. Explored what her support people might say and whether they would be willing to honor her wishes if she was done with all of this.  Her mother likely would want her to continue fighting and she is unsure what her niece/Javan would say, she is currently in class (professor in [place] ).  She is unsure whether she is interested in a family meeting or whether it would be helpful for her decision making process.  I recommended at the minimum proceeding with HCPOA documentation sooner rather than later, and discussing with Javan.  I provided with PMT contact information, reminding her that her mother also has PMT contact information.  Encouraged her to continue processing this information and call PMT if interested in a family meeting to discuss changing her care plan  according to her wishes.  Also informed her if she does not call for a family meeting, that I will be back next week to continue supporting her with ongoing goals of care discussions.  She verbalized understanding.  Questions and concerns addressed. PMT will continue to support holistically.  Objective:  Medical records reviewed including all progress notes from 1/22.  Several failed attempts to draw labs by IV team, ultimately drawn from HD cath prior to removal tomorrow. Discussed with MD, nephrology NP, RN.   Physical Exam Vitals and nursing note reviewed.  Constitutional:      General: She is not in acute distress.    Appearance: She is ill-appearing.     Interventions: Nasal cannula in place.  HENT:     Head: Normocephalic and atraumatic.  Cardiovascular:     Rate  and Rhythm: Normal rate.  Pulmonary:     Effort: Pulmonary effort is normal. No respiratory distress.  Skin:    General: Skin is dry.  Neurological:     Mental Status: She is alert.  Psychiatric:        Mood and Affect: Affect is tearful.            Vital Signs: BP (!) 81/34 (BP Location: Left Wrist)   Pulse 87   Temp 99.9 F (37.7 C) (Axillary)   Resp 18   Ht 5' 6 (1.676 m)   Wt 106.6 kg   LMP 10/10/2012   SpO2 97%   BMI 37.93 kg/m  SpO2: SpO2: 97 % O2 Device: O2 Device: Room Air O2 Flow Rate: O2 Flow Rate (L/min): 3 L/min      Palliative Assessment/Data: 30%   Palliative Care Assessment & Plan   Patient Profile: 59 y.o. female  with past medical history of PAF, right BKA, end-stage renal disease on dialysis, chronic pain in her abdomen and bilateral legs admitted on 03/16/2024 with syncopal episode and loss of consciousness at [facility].    In the ED, patient was found to have metabolic acidosis and lactic acid of 9, SIRS criteria, ultimately admitted for septic shock requiring pressors.  She was also recently hospitalized in December 2025 for sepsis secondary to left thigh abscess.  Chart review also shows  that she was discharged from physical medicine and rehabilitation clinic for her pain management in January 2025 due to positive drug screen via oral swab.  Patient reports she is now seen by [facility] pain clinic.  Refused HD today due to pain.   PMT has been consulted to assist with goals of care conversation.  She was last seen by PMT for goals of care discussion in December 2024.  Assessment: Goals of care conversation End-stage renal disease on dialysis Persistent bacteremia  Recommendations/Plan: Continue full code/full scope treatment  Patient remains undecided on her preference for focus of her care. She understands her right to opt against interventions that she does not find beneficial to her quality of life Family meeting was recommended and offered.  Patient is undecided and will call PMT if interested Psychosocial and emotional support provided PMT will see if called for family meeting, otherwise will see next week   Prognosis: Poor  Discharge Planning: To Be Determined  Care plan was discussed with patient, MD, nephrology NP, RN   Mickle SHAUNNA Fell, PA-C  Palliative Medicine Team Team phone # [phone]  Thank you for allowing the Palliative Medicine Team to assist in the care of this patient. Please utilize secure chat with additional questions, if there is no response within 30 minutes please call the above phone number.  Palliative Medicine Team providers are available by phone from 7am to 7pm daily and can be reached through the team cell phone.  Should this patient require assistance outside of these hours, please call the patient's attending physician.    I personally spent a total of 35 minutes in the care of the patient today including preparing to see the patient, getting/reviewing separately obtained history, performing a medically appropriate exam/evaluation, counseling and educating, referring and communicating with other health care professionals,  documenting clinical information in the EHR, and communicating results.  "

## 2024-03-29 NOTE — Progress Notes (Signed)
 At bedside for PIV insertion. Upon assessment, no suitable vessels located for cannulation in R extremity. Assessed L arm (non-functioning AVF) with Dr. Susannah ok with no success. No suitable vessels noted. Dr. Sherlon at bedside and aware of poor vasculature. RN made aware.

## 2024-03-29 NOTE — Progress Notes (Signed)
" °   03/29/24 1600  Vitals  Temp 98.5 F (36.9 C)  Temp Source Oral  BP (!) 79/59  MAP (mmHg) 68  BP Location Left Wrist  BP Method Automatic  Patient Position (if appropriate) Lying  Pulse Rate 100  Pulse Rate Source Monitor  ECG Heart Rate 99  Resp 20  Level of Consciousness  Level of Consciousness Alert  MEWS COLOR  MEWS Score Color Yellow  Oxygen Therapy  SpO2 98 %  O2 Device Room Air  MEWS Score  MEWS Temp 0  MEWS Systolic 2  MEWS Pulse 0  MEWS RR 0  MEWS LOC 0  MEWS Score 2   Provider aware  "

## 2024-03-29 NOTE — Plan of Care (Signed)
" °  Problem: Clinical Measurements: Goal: Respiratory complications will improve Outcome: Progressing Goal: Cardiovascular complication will be avoided Outcome: Progressing   Problem: Education: Goal: Knowledge of General Education information will improve Description: Including pain rating scale, medication(s)/side effects and non-pharmacologic comfort measures Outcome: Not Progressing   Problem: Health Behavior/Discharge Planning: Goal: Ability to manage health-related needs will improve Outcome: Not Progressing   Problem: Clinical Measurements: Goal: Ability to maintain clinical measurements within normal limits will improve Outcome: Not Progressing Goal: Will remain free from infection Outcome: Not Progressing   "

## 2024-03-29 NOTE — Inpatient Diabetes Management (Signed)
 Inpatient Diabetes Program Recommendations  AACE/ADA: New Consensus Statement on Inpatient Glycemic Control (2015)  Target Ranges:  Prepandial:   less than 140 mg/dL      Peak postprandial:   less than 180 mg/dL (1-2 hours)      Critically ill patients:  140 - 180 mg/dL   Lab Results  Component Value Date   GLUCAP 136 (H) 03/29/2024   HGBA1C 4.3 (L) 02/15/2024    Review of Glycemic Control  Latest Reference Range & Units 03/29/24 04:08 03/29/24 05:01 03/29/24 08:43 03/29/24 11:58  Glucose-Capillary 70 - 99 mg/dL 67 (L) 83 877 (H) 863 (H)   Diabetes history: DM 2 Outpatient Diabetes medications:  None listed Current orders for Inpatient glycemic control:  Lantus  5 units daily Novolog  0-9 units tid with meals and HS Inpatient Diabetes Program Recommendations:    May consider d/c of Lantus .  Morning glucose=67 mg/dL.   Thanks,  Randall Bullocks, RN, BC-ADM Inpatient Diabetes Coordinator Pager [phone]  (8a-5p)

## 2024-03-29 NOTE — Progress Notes (Signed)
 Phlebotomy stated unable to get any labs on patient- updated provider- requesting consult to IV team

## 2024-03-29 NOTE — Plan of Care (Signed)
 Patient is progressing towards goals of care.     Problem: Education: Goal: Knowledge of General Education information will improve Description: Including pain rating scale, medication(s)/side effects and non-pharmacologic comfort measures Outcome: Progressing   Problem: Health Behavior/Discharge Planning: Goal: Ability to manage health-related needs will improve Outcome: Progressing   Problem: Clinical Measurements: Goal: Ability to maintain clinical measurements within normal limits will improve Outcome: Progressing Goal: Will remain free from infection Outcome: Progressing Goal: Diagnostic test results will improve Outcome: Progressing Goal: Respiratory complications will improve Outcome: Progressing Goal: Cardiovascular complication will be avoided Outcome: Progressing   Problem: Activity: Goal: Risk for activity intolerance will decrease Outcome: Progressing   Problem: Nutrition: Goal: Adequate nutrition will be maintained Outcome: Progressing   Problem: Coping: Goal: Level of anxiety will decrease Outcome: Progressing   Problem: Elimination: Goal: Will not experience complications related to bowel motility Outcome: Progressing Goal: Will not experience complications related to urinary retention Outcome: Progressing   Problem: Pain Managment: Goal: General experience of comfort will improve and/or be controlled Outcome: Progressing   Problem: Safety: Goal: Ability to remain free from injury will improve Outcome: Progressing   Problem: Skin Integrity: Goal: Risk for impaired skin integrity will decrease Outcome: Progressing   Problem: Education: Goal: Ability to describe self-care measures that may prevent or decrease complications (Diabetes Survival Skills Education) will improve Outcome: Progressing Goal: Individualized Educational Video(s) Outcome: Progressing   Problem: Coping: Goal: Ability to adjust to condition or change in health will  improve Outcome: Progressing   Problem: Fluid Volume: Goal: Ability to maintain a balanced intake and output will improve Outcome: Progressing   Problem: Health Behavior/Discharge Planning: Goal: Ability to identify and utilize available resources and services will improve Outcome: Progressing Goal: Ability to manage health-related needs will improve Outcome: Progressing   Problem: Metabolic: Goal: Ability to maintain appropriate glucose levels will improve Outcome: Progressing   Problem: Nutritional: Goal: Maintenance of adequate nutrition will improve Outcome: Progressing Goal: Progress toward achieving an optimal weight will improve Outcome: Progressing   Problem: Skin Integrity: Goal: Risk for impaired skin integrity will decrease Outcome: Progressing   Problem: Tissue Perfusion: Goal: Adequacy of tissue perfusion will improve Outcome: Progressing

## 2024-03-29 NOTE — Progress Notes (Signed)
" °   03/29/24 1159  Vitals  Temp 98 F (36.7 C)  Temp Source Oral  BP 101/82  MAP (mmHg) 89  Pulse Rate (!) 115  Pulse Rate Source Monitor  ECG Heart Rate (!) 111  Resp 20  Level of Consciousness  Level of Consciousness Alert  MEWS COLOR  MEWS Score Color Yellow  Oxygen Therapy  SpO2 90 %  O2 Device Room Air  MEWS Score  MEWS Temp 0  MEWS Systolic 0  MEWS Pulse 2  MEWS RR 0  MEWS LOC 0  MEWS Score 2   Provider aware  "

## 2024-03-30 ENCOUNTER — Inpatient Hospital Stay (HOSPITAL_COMMUNITY)

## 2024-03-30 DIAGNOSIS — Z7189 Other specified counseling: Secondary | ICD-10-CM | POA: Diagnosis not present

## 2024-03-30 DIAGNOSIS — Z515 Encounter for palliative care: Secondary | ICD-10-CM | POA: Diagnosis not present

## 2024-03-30 DIAGNOSIS — R7881 Bacteremia: Secondary | ICD-10-CM | POA: Diagnosis not present

## 2024-03-30 DIAGNOSIS — B961 Klebsiella pneumoniae [K. pneumoniae] as the cause of diseases classified elsewhere: Secondary | ICD-10-CM | POA: Diagnosis not present

## 2024-03-30 DIAGNOSIS — N186 End stage renal disease: Secondary | ICD-10-CM | POA: Diagnosis not present

## 2024-03-30 LAB — CBC
HCT: 28.3 % — ABNORMAL LOW (ref 36.0–46.0)
Hemoglobin: 9.7 g/dL — ABNORMAL LOW (ref 12.0–15.0)
MCH: 31.7 pg (ref 26.0–34.0)
MCHC: 34.3 g/dL (ref 30.0–36.0)
MCV: 92.5 fL (ref 80.0–100.0)
Platelets: 324 K/uL (ref 150–400)
RBC: 3.06 MIL/uL — ABNORMAL LOW (ref 3.87–5.11)
RDW: 19.1 % — ABNORMAL HIGH (ref 11.5–15.5)
WBC: 16.1 K/uL — ABNORMAL HIGH (ref 4.0–10.5)
nRBC: 0 % (ref 0.0–0.2)

## 2024-03-30 LAB — RENAL FUNCTION PANEL
Albumin: 2.6 g/dL — ABNORMAL LOW (ref 3.5–5.0)
Anion gap: 12 (ref 5–15)
BUN: 25 mg/dL — ABNORMAL HIGH (ref 6–20)
CO2: 23 mmol/L (ref 22–32)
Calcium: 8 mg/dL — ABNORMAL LOW (ref 8.9–10.3)
Chloride: 106 mmol/L (ref 98–111)
Creatinine, Ser: 4.31 mg/dL — ABNORMAL HIGH (ref 0.44–1.00)
GFR, Estimated: 11 mL/min — ABNORMAL LOW
Glucose, Bld: 85 mg/dL (ref 70–99)
Phosphorus: 4.3 mg/dL (ref 2.5–4.6)
Potassium: 3.5 mmol/L (ref 3.5–5.1)
Sodium: 140 mmol/L (ref 135–145)

## 2024-03-30 LAB — CBC WITH DIFFERENTIAL/PLATELET
Abs Immature Granulocytes: 0.13 K/uL — ABNORMAL HIGH (ref 0.00–0.07)
Basophils Absolute: 0.1 K/uL (ref 0.0–0.1)
Basophils Relative: 1 %
Eosinophils Absolute: 0.2 K/uL (ref 0.0–0.5)
Eosinophils Relative: 1 %
HCT: 20.4 % — ABNORMAL LOW (ref 36.0–46.0)
Hemoglobin: 6.7 g/dL — CL (ref 12.0–15.0)
Immature Granulocytes: 1 %
Lymphocytes Relative: 13 %
Lymphs Abs: 2 K/uL (ref 0.7–4.0)
MCH: 32.4 pg (ref 26.0–34.0)
MCHC: 32.8 g/dL (ref 30.0–36.0)
MCV: 98.6 fL (ref 80.0–100.0)
Monocytes Absolute: 0.7 K/uL (ref 0.1–1.0)
Monocytes Relative: 5 %
Neutro Abs: 11.9 K/uL — ABNORMAL HIGH (ref 1.7–7.7)
Neutrophils Relative %: 79 %
Platelets: 337 K/uL (ref 150–400)
RBC: 2.07 MIL/uL — ABNORMAL LOW (ref 3.87–5.11)
RDW: 19.2 % — ABNORMAL HIGH (ref 11.5–15.5)
WBC: 15.1 K/uL — ABNORMAL HIGH (ref 4.0–10.5)
nRBC: 0 % (ref 0.0–0.2)

## 2024-03-30 LAB — PREPARE RBC (CROSSMATCH)

## 2024-03-30 LAB — GLUCOSE, CAPILLARY
Glucose-Capillary: 115 mg/dL — ABNORMAL HIGH (ref 70–99)
Glucose-Capillary: 132 mg/dL — ABNORMAL HIGH (ref 70–99)
Glucose-Capillary: 71 mg/dL (ref 70–99)
Glucose-Capillary: 90 mg/dL (ref 70–99)

## 2024-03-30 LAB — MAGNESIUM: Magnesium: 2.1 mg/dL (ref 1.7–2.4)

## 2024-03-30 MED ORDER — SODIUM CHLORIDE 0.9% IV SOLUTION: Freq: Once | INTRAVENOUS | Status: DC

## 2024-03-30 MED ORDER — HEPARIN SODIUM (PORCINE) 1000 UNIT/ML DIALYSIS: 1000.0000 [IU] | INTRAMUSCULAR | Status: DC | PRN

## 2024-03-30 MED ORDER — MIDODRINE HCL 5 MG PO TABS
ORAL_TABLET | ORAL | Status: AC
  Filled 2024-03-30: qty 4

## 2024-03-30 MED ORDER — LIDOCAINE-EPINEPHRINE 1 %-1:100000 IJ SOLN
20.0000 mL | Freq: Once | INTRAMUSCULAR | Status: AC
  Administered 2024-03-30: 3 mL via INTRADERMAL

## 2024-03-30 MED ORDER — LIDOCAINE-EPINEPHRINE 1 %-1:100000 IJ SOLN
INTRAMUSCULAR | Status: AC
  Filled 2024-03-30: qty 20

## 2024-03-30 MED ORDER — SODIUM CHLORIDE 0.9 % IV SOLN
500.0000 mg | INTRAVENOUS | Status: AC
  Administered 2024-03-31 – 2024-04-13 (×14): 500 mg via INTRAVENOUS
  Filled 2024-03-30 (×19): qty 500

## 2024-03-30 NOTE — Plan of Care (Signed)
   Problem: Education: Goal: Knowledge of General Education information will improve Description: Including pain rating scale, medication(s)/side effects and non-pharmacologic comfort measures Outcome: Progressing   Problem: Activity: Goal: Risk for activity intolerance will decrease Outcome: Progressing   Problem: Coping: Goal: Level of anxiety will decrease Outcome: Progressing

## 2024-03-30 NOTE — Plan of Care (Signed)

## 2024-03-30 NOTE — Progress Notes (Signed)
 Pt refusing wound care, turns and hygiene even with pain med intervention.

## 2024-03-30 NOTE — Progress Notes (Signed)
" ° ° ° ° ° °  Patient still needs HD catheter removed with central line holiday  Continue carbapenem therapy and she will need 2 weeks of carbapenem therapy post HD catheter removal  Dr. Dennise is available this weekend for questions, and Dr. Dea will take over the service on Monday.  Deborah [name] 03/30/2024, 10:02 PM  "

## 2024-03-30 NOTE — Progress Notes (Signed)
" °   03/30/24 1250  Vitals  Temp 97.8 F (36.6 C)  Pulse Rate 90  Resp 16  BP 98/76  SpO2 100 %  O2 Device Nasal Cannula  Weight (S)  104.2 kg (Bed Scale)  Type of Weight Post-Dialysis  Oxygen Therapy  O2 Flow Rate (L/min) 1 L/min  Patient Activity (if Appropriate) In bed  Pulse Oximetry Type Continuous  Post Treatment  Dialyzer Clearance Clear  Hemodialysis Intake (mL) 0 mL  Liters Processed 90  Fluid Removed (mL) 1.8 mL  Tolerated HD Treatment Yes  Post-Hemodialysis Comments Pt. tolerated HD without difficulties. UF goal maintained per order. 2 units PRBC given per. order. Given Report to 5W bedside Nurse.   Received patient in bed to unit.  Alert and oriented.  Informed consent signed and in chart.   TX duration: 3.45  Patient tolerated well.  Transported back to the room  Alert, without acute distress.  Hand-off given to patient's nurse.   Access used: Yes Access issues: No  Total UF removed: 1800 Medication(s) given: See MAR Post HD VS: See Above  Post HD weight: 104.2 kg   Zebedee DELENA [facility] "

## 2024-03-30 NOTE — Progress Notes (Addendum)
 TR[facility] night cross cover note:   I was notified by the patient's RN that this patient was n.p.o. for dialysis catheter removal, which has now been completed.  I subsequently resumed his previous diet order, which was renal diet with 1200 cc daily fluid restriction.      Eva Pore, DO Hospitalist

## 2024-03-30 NOTE — Progress Notes (Signed)
 "                                                                                                                                                                                                                                                                                PROGRESS NOTE     Patient Demographics:    Deborah [name], is a 59 y.o. female, DOB - Oct 05, 1965, FMW:[id number]  Outpatient Primary MD for the patient is Pcp, No    LOS - 14  Admit date - 03/16/2024    Chief Complaint  Patient presents with   Loss of Consciousness       Brief Narrative (HPI from H&P)     59 yo female with past medical history significant for PAF, HLD, right BKA, ESRD on HD (TTS) who presented from SNF to [facility] after syncopal episode with +LOC. Patient hemodynamically unstable in ED despite fluid resuscitation with 3L crystalloid and was subsequently started on vasopressors. Labs notable for metabolic acidosis, sCr 7.04, BUN 23, iCa 1.00, troponin 105, lactic acic 9.0, given 1 amp HCO3. On exam patient with diffuse abdominal pain, exam somewhat limited as she has chronic pain managed daily with Dilaudid . Patient started empirically on Cefepime , Vanc, Flagyl ; CT A/P pending, and syncope workup in progress. PCCM consulted for ICU admission.   Of note, patient recently hospitalized 02/14/2024-02/25/2024 for sepsis secondary to left thigh abscess and underwent I&D with gen surg 02/16/2024.  She was diagnosed with sepsis and subsequently Klebsiella bacteremia she was placed on IV meropenem , due to low blood pressures and underlying ESRD she was seen by nephrology and underwent multiple sessions of CRRT in ICU, she was also placed on vancomycin  prophylactically to prevent reoccurrence of C. difficile.  She was somewhat stabilized and transferred to my care on 03/22/2024 on day 6 of her hospital stay, currently blood pressure in 80s, still has NG tube, minimally responsive with generalized pain including abdominal  pain.   Significant Hospital Events: Including procedures, antibiotic start and stop dates in addition to other pertinent events   1/9 ED s/p witnessed syncopal event w/ +LOC in shock, started on Levo/Vaso; Aline/CVC placed 1/10.  CT abdomen pelvis in the ER.  Nonacute.   1/10 CRRT, still on vaso, NE. Esclated to meropenem  for ESBL Klebsiella 1/11 echocardiogram EF 60%.  No acute changes 1/12: off levophed , on vasopressin  1/13: off vasopressin , steroids, and CRRT 1/14: transfer out of ICU, TRH to pick up 1/15 1/23: Transfused units PRBC for hemoglobin of 6.7    Subjective:   She tolerated her HD today, received 2 units PRBC, reports she is hungry, as she is waiting for her [facility] removal   Assessment  & Plan :    Septic shock due to Klebsiella pneumoniae bacteremia - Source of bacteremia is unclear. - Remains with significant leukocytosis, intermittent fever. - ID input greatly appreciated, she will need line holiday, discussed with IR, nephrology team, plan to HD tomorrow and line removal after that and line holiday over the weekend. - Antibiotics management per ID.  Will continue with meropenem  - Patient is very difficult stick, unfortunately unable to do any surveillance blood cultures, most her labs are done during HD orders through her lysis catheter line. - CT abdomen pelvis negative - With significant rash, crusting, unclear allergy   -Plan for line holiday today, catheter to be removed after HD   ESRD on HD (TTS) - -management per renal, tolerated HD today.  Chronic hypotension -Profoundly hypotensive initially requiring pressors, currently on high-dose midodrine  20 mg p.o. 3 times daily, remains hypotensive, started on albumin  as well   Opioid-use disorder Hx substance abuse - as an OP following with [facility], minimize narcotics.  Chronic pain, Hx fibromyalgia, Acute on chronic abdominal pain - continue supportive care counseled to minimize narcotic intake  as blood pressure at baseline is very low.   Anemia of chronic disease   -drop in Hb may be dilutional. No evidence of DIC or bleeding, hemoglobin 6.7 today, received 2 units PRBC   History of Cdiff  -  cdiff negative,  on prophylactic dose PO vanc   Syncope; suspect due to sepsis   - echo: EF 60-605%, mild LVH, small pericardial effusion posteriorly    Troponin elevation; likely demand d/t shock state, PAF -not on anticoagulation due to past history of GI bleed, troponin elevation due to demand mismatch from sepsis, trend is flat and in non-ACS pattern, echo shows preserved EF and no wall motion abnormality.  Chest pain-free no further workup.  She developed RVR with hypotension for which she was seen by cardiology and started on amiodarone  drip on 03/23/2024. -Heart rate is up again today, so we will give IV amiodarone  load   HLD   - PTA statin   Hx duodenal carcinoid tumor s/p resection (2022), Hx gastric ulcer & gastritis (2024) - Chronic gastroparesis  -con't H2-blocker; stable.   S/p I&D for left thigh abscess 02/16/24.   - Stable on CT scan, orthopedic input greatly appreciated   Wounds present on admission - left thigh, R hip, lower abdomen, buttocks, L heel. turns, WOC as needed Dr. Harden to evaluate left thigh postop site.  PVD s/p R BKA- stump is well healed   Sacral pressure injury - turns, unfortunately needs pressors, hard to keep that skin clear with frequent diarrhea   Possible vaginal yeast infection  -Clotrimazole  cream daily x7 days   Anxiety/Depression -Resume home buspar , risperidone , trazodone  when appropriate> waiting for mental status to normalize some   DM2; hypoglycemia from sepsis corrected with steroids - On sliding scale, low-dose Semglee , CBGs low overnight requiring D5W,  Lab Results  Component Value Date   HGBA1C 4.3 (L) 02/15/2024  CBG (last 3)  Recent Labs    03/29/24 2029 03/30/24 0056 03/30/24 0424  GLUCAP 162* 132* 115*     Difficult stick -Patient is extremely difficult stick, with multiple attempts by IV team for access and blood draws with no success, I have discussed with renal, okay to draw labs today from her tunneled dialysis catheter by IV team as it is planned to be discontinued tomorrow and her labs are essential for tomorrow's dialysis and plan.        Condition - Extremely Guarded  Family Communication  : None present  Code Status : Full code  Consults  : PCCM, nephrology, orthopedic, ID  PUD Prophylaxis :    Procedures  :           Disposition Plan  :    Status is: Inpatient   DVT Prophylaxis  :    Place and maintain sequential compression device Start: 03/20/24 1800  Lab Results  Component Value Date   PLT 337 03/30/2024    Diet :  Diet Order             Diet NPO time specified  Diet effective midnight                    Inpatient Medications  Scheduled Meds:  sodium chloride    Intravenous Once   sodium chloride    Intravenous Once   amiodarone   400 mg Oral BID   Followed by   NOREEN ON 04/01/2024] amiodarone   200 mg Oral Daily   ascorbic acid   500 mg Oral BID   atorvastatin   10 mg Oral Daily   busPIRone   5 mg Oral TID   collagenase    Topical Daily   darbepoetin (ARANESP ) injection - DIALYSIS  150 mcg Subcutaneous Q Tue-1800   famotidine   20 mg Oral QHS   feeding supplement  1 Container Oral Q24H   fluconazole   200 mg Oral Weekly   heparin  sodium (porcine)  1.9 mL Intracatheter Once   insulin  aspart  0-5 Units Subcutaneous QHS   insulin  aspart  0-9 Units Subcutaneous TID WC   midodrine   20 mg Oral Q8H   multivitamin  1 tablet Oral QHS   mouth rinse  15 mL Mouth Rinse 4 times per day   risperiDONE   1 mg Oral QHS   sodium chloride  flush  10-40 mL Intracatheter Q12H   vancomycin   125 mg Per Tube Daily   Or   vancomycin   125 mg Oral Daily   Continuous Infusions:  albumin  human 25 g (03/30/24 0357)   meropenem  (MERREM ) IV 1 g (03/29/24 2102)    PRN Meds:.acetaminophen , HYDROmorphone , hydrOXYzine , mouth rinse, oxyCODONE  **OR** [DISCONTINUED] oxyCODONE , polyethylene glycol, senna    Objective:   Vitals:   03/30/24 1202 03/30/24 1230 03/30/24 1232 03/30/24 1250  BP: 101/76 101/73 108/74 98/76  Pulse: 88 88 97 90  Resp: 16 (!) 22 16 16   Temp:   97.8 F (36.6 C) 97.8 F (36.6 C)  TempSrc:      SpO2: 99% 99% 97% 100%  Weight:    (S) 104.2 kg  Height:        Wt Readings from Last 3 Encounters:  03/30/24 (S) 104.2 kg  02/25/24 97.2 kg  06/20/23 114.5 kg     Intake/Output Summary (Last 24 hours) at 03/30/2024 1354 Last data filed at 03/30/2024 1250 Gross per 24 hour  Intake 1183.55 ml  Output 1.8 ml  Net 1181.75 ml  Physical Exam  Frail, chronic ill-appearing, deconditioned  Good air entry  Regular rate and rhythm  Abdomen soft  Right BKA  frail, ill-appearing, deconditioned  Skin with diffuse desquamating rash      RN pressure injury documentation: Wound 02/19/24 0200 Pressure Injury Foot Left;Lateral Unstageable - Full thickness tissue loss in which the base of the injury is covered by slough (yellow, tan, gray, green or brown) and/or eschar (tan, brown or black) in the wound bed. (Active)     Wound 03/17/24 0045 Pressure Injury Thigh Left;Posterior Stage 2 -  Partial thickness loss of dermis presenting as a shallow open injury with a red, pink wound bed without slough. (Active)     Wound 03/17/24 0045 Pressure Injury Hip Lateral;Right Stage 2 -  Partial thickness loss of dermis presenting as a shallow open injury with a red, pink wound bed without slough. (Active)     Wound 03/17/24 0045 Pressure Injury Abdomen Lower;Left Stage 2 -  Partial thickness loss of dermis presenting as a shallow open injury with a red, pink wound bed without slough. (Active)     Wound 03/17/24 1119 Pressure Injury Buttocks Right;Left;Medial Stage 3 -  Full thickness tissue loss. Subcutaneous fat may be visible but bone,  tendon or muscle are NOT exposed. (Active)      Data Review:    Recent Labs  Lab 03/24/24 0730 03/27/24 1328 03/29/24 1230 03/30/24 0846  WBC 16.8* 23.2* 19.4* 15.1*  HGB 7.7* 8.1* 8.1* 6.7*  HCT 24.2* 24.9* 25.7* 20.4*  PLT 218 370 390 337  MCV 98.0 97.6 100.8* 98.6  MCH 31.2 31.8 31.8 32.4  MCHC 31.8 32.5 31.5 32.8  RDW 20.5* 19.8* 19.8* 19.2*  LYMPHSABS 1.9 2.0  --  2.0  MONOABS 0.9 1.7*  --  0.7  EOSABS 0.2 0.2  --  0.2  BASOSABS 0.0 0.1  --  0.1    Recent Labs  Lab 03/24/24 0730 03/24/24 1618 03/27/24 1327 03/29/24 1230 03/30/24 0846  NA 136 135 139 138 140  K 3.9 3.6 3.7 3.8 3.5  CL 103 101 105 102 106  CO2 23 24 24 24 23   ANIONGAP 10 10 10 12 12   GLUCOSE 87 139* 162* 163* 85  BUN 37* 19 30* 22* 25*  CREATININE 4.11* 2.33* 4.33* 3.84* 4.31*  ALBUMIN   --  1.8* 1.7* 1.9* 2.6*  MG  --  1.9 2.1 1.9 2.1  PHOS  --  2.9 4.5 4.3 4.3  CALCIUM  6.9* 7.1* 7.1* 7.4* 8.0*      Recent Labs  Lab 03/24/24 0730 03/24/24 1618 03/27/24 1327 03/29/24 1230 03/30/24 0846  MG  --  1.9 2.1 1.9 2.1  CALCIUM  6.9* 7.1* 7.1* 7.4* 8.0*    --------------------------------------------------------------------------------------------------------------- Lab Results  Component Value Date   CHOL 104 07/22/2019   HDL 30 (L) 07/22/2019   LDLCALC 37 07/22/2019   TRIG 185 (H) 07/22/2019   CHOLHDL 3.5 07/22/2019    Lab Results  Component Value Date   HGBA1C 4.3 (L) 02/15/2024   No results for input(s): TSH, T4TOTAL, FREET4, T3FREE, THYROIDAB in the last 72 hours. No results for input(s): VITAMINB12, FOLATE, FERRITIN, TIBC, IRON , RETICCTPCT in the last 72 hours. ------------------------------------------------------------------------------------------------------------------ Cardiac Enzymes No results for input(s): CKMB, TROPONINI, MYOGLOBIN in the last 168 hours.  Invalid input(s): CK  Radiology Report No results found.    Signature  -    Brayton Lye M.D on 03/30/2024 at 1:54 PM   -  To page go to [email or web address]   "

## 2024-03-30 NOTE — TOC Progression Note (Signed)
 Transition of Care [facility]) - Progression Note    Patient Details  Name: Deborah [name] MRN: [id number] Date of Birth: 1965/04/09  [facility] CM/SW Contact  Inocente GORMAN Kindle, LCSW Phone Number: 03/30/2024, 9:25 AM  Clinical Narrative:    CSW continuing to follow for medical stability and bed availability at [facility].    Expected Discharge Plan: Long Term Nursing Home Barriers to Discharge: Continued Medical Work up               Expected Discharge Plan and Services       Living arrangements for the past 2 months: Skilled Nursing Facility                                       Social Drivers of Health (SDOH) Interventions SDOH Screenings   Food Insecurity: No Food Insecurity (03/18/2024)  Housing: Low Risk (03/18/2024)  Transportation Needs: No Transportation Needs (03/18/2024)  Utilities: Not At Risk (03/18/2024)  Recent Concern: Utilities - At Risk (02/18/2024)  Depression (PHQ2-9): Medium Risk (03/16/2023)  Financial Resource Strain: Low Risk (03/23/2021)   [facility])  Social Connections: Low Risk (03/23/2021)   Received from [facility])  Stress: Low Risk (03/23/2021)   Received from [facility])  Tobacco Use: Low Risk (03/18/2024)    Readmission Risk Interventions    03/28/2024    3:24 PM 02/20/2024   11:05 AM 03/07/2023    4:19 PM  Readmission Risk Prevention Plan  Transportation Screening Complete Complete Complete  Medication Review (RN Care Manager) Complete Complete Complete  PCP or Specialist appointment within 3-5 days of discharge Complete Complete Complete  HRI or Home Care Consult Complete Complete Complete  SW Recovery Care/Counseling Consult Complete Complete Complete  Palliative Care Screening Complete Not Applicable Complete  Skilled Nursing Facility Complete Complete Not Applicable

## 2024-03-30 NOTE — Procedures (Addendum)
 Successful removal of tunneled HD catheter due to bacteremia for line holiday.  EBL: None No immediate complications.  Please see imaging section of Epic for full dictation.  Kimble DEL Roddie Riegler PA-C 03/30/2024 3:54 PM

## 2024-03-30 NOTE — Progress Notes (Signed)
 " [facility] Progress Note   Subjective:   Seen on HD. 2L UFG planned. BP soft, may need an extra mido. Denies CP/dyspnea. For [facility] removal after HD todayy (for line holiday).  Objective Vitals:   03/30/24 0848 03/30/24 0904 03/30/24 0935 03/30/24 1007  BP: (!) 104/55 (!) 108/56 99/63 (!) 105/58  Pulse: 93 94 93 96  Resp: 13 17 20 19   Temp: 97.6 F (36.4 C)     TempSrc:      SpO2: 98% 99% 99% 95%  Weight: (S) 105.4 kg     Height:       Physical Exam General: Chronically ill appearing woman, NAD. [place] O2 in place Heart: RRR Lungs: CTA anteriorly Abdomen: soft, no wounds present Extremities: Trace BLE edema Dialysis Access: TDC in R chest  Additional Objective Labs: Basic Metabolic Panel: Recent Labs  Lab 03/27/24 1327 03/29/24 1230 03/30/24 0846  NA 139 138 140  K 3.7 3.8 3.5  CL 105 102 106  CO2 24 24 23   GLUCOSE 162* 163* 85  BUN 30* 22* 25*  CREATININE 4.33* 3.84* 4.31*  CALCIUM  7.1* 7.4* 8.0*  PHOS 4.5 4.3 4.3   Liver Function Tests: Recent Labs  Lab 03/27/24 1327 03/29/24 1230 03/30/24 0846  ALBUMIN  1.7* 1.9* 2.6*   CBC: Recent Labs  Lab 03/24/24 0730 03/27/24 1328 03/29/24 1230 03/30/24 0846  WBC 16.8* 23.2* 19.4* 15.1*  NEUTROABS 13.6* 19.1*  --  11.9*  HGB 7.7* 8.1* 8.1* 6.7*  HCT 24.2* 24.9* 25.7* 20.4*  MCV 98.0 97.6 100.8* 98.6  PLT 218 370 390 337   Blood Culture    Component Value Date/Time   SDES BLOOD RIGHT ANTECUBITAL 03/16/2024 1949   SPECREQUEST  03/16/2024 1949    BOTTLES DRAWN AEROBIC AND ANAEROBIC Blood Culture results may not be optimal due to an inadequate volume of blood received in culture bottles   CULT (A) 03/16/2024 1949    KLEBSIELLA PNEUMONIAE Confirmed Extended Spectrum Beta-Lactamase Producer (ESBL).  In bloodstream infections from ESBL organisms, carbapenems are preferred over piperacillin /tazobactam. They are shown to have a lower risk of mortality.    REPTSTATUS 03/19/2024 FINAL 03/16/2024 1949    Medications:  albumin  human 25 g (03/30/24 0357)   meropenem  (MERREM ) IV 1 g (03/29/24 2102)    sodium chloride    Intravenous Once   sodium chloride    Intravenous Once   amiodarone   400 mg Oral BID   Followed by   NOREEN ON 04/01/2024] amiodarone   200 mg Oral Daily   ascorbic acid   500 mg Oral BID   atorvastatin   10 mg Oral Daily   busPIRone   5 mg Oral TID   collagenase    Topical Daily   darbepoetin (ARANESP ) injection - DIALYSIS  150 mcg Subcutaneous Q Tue-1800   famotidine   20 mg Oral QHS   feeding supplement  1 Container Oral Q24H   fluconazole   200 mg Oral Weekly   heparin  sodium (porcine)  1.9 mL Intracatheter Once   insulin  aspart  0-5 Units Subcutaneous QHS   insulin  aspart  0-9 Units Subcutaneous TID WC   midodrine   20 mg Oral Q8H   multivitamin  1 tablet Oral QHS   mouth rinse  15 mL Mouth Rinse 4 times per day   risperiDONE   1 mg Oral QHS   sodium chloride  flush  10-40 mL Intracatheter Q12H   vancomycin   125 mg Per Tube Daily   Or   vancomycin   125 mg Oral Daily    Dialysis  Orders TTS - [facility] 4h, 400/800, EDW 96.1kg, 3K/2.5Ca bath, TDC, no heparin  - Mircera 255mcg IV q 2 weeks (last 1/3) - Hectoral 2mcg IV q HD  Assessment/Plan: ESRD - Usual TTS schedule - s/p CRRT 1/10-1/13 given significant shock - For HD today, followed by [facility] removal for line holiday. Anticipate next HD to be Mon or Tues   PAfib - cardiology following, back on PO amiodarone    Hypotension/volume - Septic on admit,  S/p pressors - Now on high dose midodrine  20mg  TID + extra prn with HD. - UF as tolerated with HD   Sepsis/ESBL Klebsiella bacteremia - On Meropenem  - Blood Cx 03/16/24 positive - For repeat Blood Cx after TDC removal today.  Anemia of ESRD - Hgb down to 6.7 - getting 2U PRBCs today.   Diarrhea - On empiric PO Vanc given Hx C.diff - Toxin negative on 1/10   Wounds -mgmt per primary service   Dispo/GOC - Palliative following. Mood better today. - Plan is SNF      Izetta Boehringer, PA-C 03/30/2024, 10:29 AM  [facility]    "

## 2024-03-30 NOTE — Progress Notes (Addendum)
 Contacted on call MD re: clarify midodrine  admin this AM, BP 91/52. Per MD, OK to give.  Pt refusing midodrine  tablets, stating that they aren't hers, she does not need them.   Pt's mentation A/Ox4, but can wax/wane at times. Pt forgetful this morning.   Pt then stated she wants the midodrine  tablets to keep her BP up. Pt took tablets.

## 2024-03-31 ENCOUNTER — Inpatient Hospital Stay (HOSPITAL_COMMUNITY)

## 2024-03-31 DIAGNOSIS — Z515 Encounter for palliative care: Secondary | ICD-10-CM | POA: Diagnosis not present

## 2024-03-31 DIAGNOSIS — Z7189 Other specified counseling: Secondary | ICD-10-CM | POA: Diagnosis not present

## 2024-03-31 DIAGNOSIS — R7881 Bacteremia: Secondary | ICD-10-CM | POA: Diagnosis not present

## 2024-03-31 DIAGNOSIS — N186 End stage renal disease: Secondary | ICD-10-CM | POA: Diagnosis not present

## 2024-03-31 LAB — BPAM RBC
Blood Product Expiration Date: 202602222359
Unit Type and Rh: 5100

## 2024-03-31 LAB — GLUCOSE, CAPILLARY
Glucose-Capillary: 112 mg/dL — ABNORMAL HIGH (ref 70–99)
Glucose-Capillary: 119 mg/dL — ABNORMAL HIGH (ref 70–99)
Glucose-Capillary: 67 mg/dL — ABNORMAL LOW (ref 70–99)
Glucose-Capillary: 69 mg/dL — ABNORMAL LOW (ref 70–99)
Glucose-Capillary: 71 mg/dL (ref 70–99)
Glucose-Capillary: 72 mg/dL (ref 70–99)
Glucose-Capillary: 73 mg/dL (ref 70–99)
Glucose-Capillary: 77 mg/dL (ref 70–99)
Glucose-Capillary: 90 mg/dL (ref 70–99)

## 2024-03-31 MED ORDER — DEXTROSE 50 % IV SOLN
INTRAVENOUS | Status: AC
  Administered 2024-03-31: 12.5 g via INTRAVENOUS
  Filled 2024-03-31: qty 50

## 2024-03-31 MED ORDER — ACETAMINOPHEN 650 MG RE SUPP
650.0000 mg | RECTAL | Status: DC | PRN
  Administered 2024-03-31: 650 mg via RECTAL
  Filled 2024-03-31: qty 1

## 2024-03-31 MED ORDER — LOPERAMIDE HCL 2 MG PO CAPS
2.0000 mg | ORAL_CAPSULE | ORAL | Status: AC | PRN
  Administered 2024-04-03 – 2024-04-04 (×2): 2 mg via ORAL
  Filled 2024-03-31 (×2): qty 1

## 2024-03-31 MED ORDER — ACETAMINOPHEN 500 MG PO TABS
1000.0000 mg | ORAL_TABLET | Freq: Four times a day (QID) | ORAL | Status: DC | PRN
  Filled 2024-03-31: qty 2

## 2024-03-31 MED ORDER — DEXTROSE 50 % IV SOLN: 12.5000 g | INTRAVENOUS | Status: AC

## 2024-03-31 MED ORDER — ACETAMINOPHEN 325 MG PO TABS
650.0000 mg | ORAL_TABLET | Freq: Four times a day (QID) | ORAL | Status: DC | PRN
  Administered 2024-04-08: 650 mg via ORAL
  Filled 2024-03-31: qty 2

## 2024-03-31 NOTE — Plan of Care (Signed)
 Patient is progressing towards goals of care.     Problem: Education: Goal: Knowledge of General Education information will improve Description: Including pain rating scale, medication(s)/side effects and non-pharmacologic comfort measures Outcome: Progressing   Problem: Health Behavior/Discharge Planning: Goal: Ability to manage health-related needs will improve Outcome: Progressing   Problem: Clinical Measurements: Goal: Ability to maintain clinical measurements within normal limits will improve Outcome: Progressing Goal: Will remain free from infection Outcome: Progressing Goal: Diagnostic test results will improve Outcome: Progressing Goal: Respiratory complications will improve Outcome: Progressing Goal: Cardiovascular complication will be avoided Outcome: Progressing   Problem: Activity: Goal: Risk for activity intolerance will decrease Outcome: Progressing   Problem: Nutrition: Goal: Adequate nutrition will be maintained Outcome: Progressing   Problem: Coping: Goal: Level of anxiety will decrease Outcome: Progressing   Problem: Elimination: Goal: Will not experience complications related to bowel motility Outcome: Progressing Goal: Will not experience complications related to urinary retention Outcome: Progressing   Problem: Pain Managment: Goal: General experience of comfort will improve and/or be controlled Outcome: Progressing   Problem: Safety: Goal: Ability to remain free from injury will improve Outcome: Progressing   Problem: Skin Integrity: Goal: Risk for impaired skin integrity will decrease Outcome: Progressing   Problem: Education: Goal: Ability to describe self-care measures that may prevent or decrease complications (Diabetes Survival Skills Education) will improve Outcome: Progressing Goal: Individualized Educational Video(s) Outcome: Progressing   Problem: Coping: Goal: Ability to adjust to condition or change in health will  improve Outcome: Progressing   Problem: Fluid Volume: Goal: Ability to maintain a balanced intake and output will improve Outcome: Progressing   Problem: Health Behavior/Discharge Planning: Goal: Ability to identify and utilize available resources and services will improve Outcome: Progressing Goal: Ability to manage health-related needs will improve Outcome: Progressing   Problem: Metabolic: Goal: Ability to maintain appropriate glucose levels will improve Outcome: Progressing   Problem: Nutritional: Goal: Maintenance of adequate nutrition will improve Outcome: Progressing Goal: Progress toward achieving an optimal weight will improve Outcome: Progressing   Problem: Skin Integrity: Goal: Risk for impaired skin integrity will decrease Outcome: Progressing   Problem: Tissue Perfusion: Goal: Adequacy of tissue perfusion will improve Outcome: Progressing

## 2024-03-31 NOTE — Progress Notes (Signed)
 [facility] night cross cover note:   I was notified by the patient's RN of the patient's elevated temperature of 101, consistent with reported fever less than 24 hours ago, with temp of 101 reported at that time prompting interval infectious workup that has included checking of urinalysis, procalcitonin level, gated chest x-ray as well as collection of blood cultures x 2.  Given that this evening's elevated temperature is not a new fever for this patient; her temperature elevation occurring within the last 24 hours, will refrain from repeating infectious workup at this time.  Patient's RN conveys that while the patient does have an existing order for prn po acetaminophen  for fever, that the patient is refusing to take the oral acetaminophen , instead requesting that she receive her acetaminophen  per rectum.   Per patient request, I subsequently placed order for as needed acetaminophen  suppository for fever.     Eva Pore, DO Hospitalist

## 2024-03-31 NOTE — Progress Notes (Signed)
 Episode of hypoglycemia this a.m. corrected to 119. Oral temp of 101. MD aware. See new orders. Pt. Refused T&P; incontinence and wound care and p.o. fluids. RN Charge aware. Will continue to encoruage compliance all appropriate patient care.

## 2024-03-31 NOTE — Consult Note (Signed)
 "  Chief Complaint: ESRD requiring hemodialysis - IR consulted for tunneled HD catheter placement  Referring Provider(s): Stovall, Lamarr SAUNDERS, PA-C   Supervising Physician: Vanice Revel  Patient Status: [name] - In-pt  History of Present Illness: Deborah [name] is a 59 y.o. female with hx of ESRD requiring hemodialysis. Originally presented to the hospital on 03/16/24 with syncope, found to he hemodynamically unstable. She was diagnosed with sepsis and bacteremia. Due to bacteremia, pt had TDC removed 03/30/24 for line holiday. IR consulted for placing tunneled hd catheter after this period.   Today patient tired, but no other specific complaints. Discussed procedure and is familiar with the tunneled catheter placement. All questions answered.   Patient is Full Code  Past Medical History:  Diagnosis Date   Acute back pain with sciatica, left    Acute back pain with sciatica, right    Acute encephalopathy 05/29/2022   Acute osteomyelitis of right calcaneus (HCC) 12/21/2022   Anemia, unspecified    Atrial fibrillation with RVR (HCC)-resolved 09/09/2022   Atypical chest pain 09/10/2021   Cancer (HCC)    Carcinoid tumor of duodenum (HCC)    Chest pain with normal coronary angiography 2019   Chronic a-fib (HCC) 09/09/2022   Chronic pain    Chronic systolic CHF (congestive heart failure) (HCC)    Dehiscence of amputation stump of right lower extremity (HCC) 01/29/2023   Diabetes mellitus    DKA (diabetic ketoacidosis) (HCC)    Drug-seeking behavior    21 hospitalizations and 14 CT a/p in 2 years for N/V and abdominal pain, demanding only IV dilaudid    Elevated troponin    chronic   Esophageal reflux    Fibromyalgia    Gastric ulcer    Gastroparesis    Gout    HCAP (healthcare-associated pneumonia) 06/19/2022   Hyperlipidemia    Hyperosmolar hyperglycemic state (HHS) (HCC) 05/11/2022   Hyperosmolar non-ketotic state due to type 2 diabetes mellitus (HCC) 05/11/2022    Hypertension    Hypomagnesemia    Lumbosacral stenosis    LVH (left ventricular hypertrophy)    Morbid obesity (HCC)    Nausea & vomiting 09/09/2022   NICM (nonischemic cardiomyopathy) (HCC)    PAF (paroxysmal atrial fibrillation) (HCC)    Sepsis (HCC) 11/23/2022   Stroke (HCC) 02/2011   Symptomatic anemia 12/14/2022   Thrombocytosis    Vitamin B12 deficiency anemia     Past Surgical History:  Procedure Laterality Date   ABDOMINAL AORTOGRAM W/LOWER EXTREMITY N/A 11/29/2022   Procedure: ABDOMINAL AORTOGRAM W/LOWER EXTREMITY;  Surgeon: Pearline Norman RAMAN, MD;  [facility]: [facility];  Service: Cardiovascular;  Laterality: N/A;   AMPUTATION Right 12/29/2022   Procedure: RIGHT BELOW KNEE AMPUTATION;  Surgeon: Harden Jerona GAILS, MD;  Location: [facility] OR;  Service: Orthopedics;  Laterality: Right;   AV FISTULA PLACEMENT Left 06/30/2022   Procedure: LEFT BRACHIOCEPHALIC ARTERIOVENOUS (AV) FISTULA CREATION;  Surgeon: Gretta Lonni PARAS, MD;  Location: [facility] OR;  Service: Vascular;  Laterality: Left;   BIOPSY  07/27/2019   Procedure: BIOPSY;  Surgeon: Rosalie Kitchens, MD;  Location: [facility] ENDOSCOPY;  Service: Endoscopy;;   BIOPSY  07/30/2019   Procedure: BIOPSY;  Surgeon: Elicia Claw, MD;  Location: [facility] ENDOSCOPY;  Service: Gastroenterology;;   BIOPSY  04/28/2023   Procedure: BIOPSY;  Surgeon: Federico Rosario BROCKS, MD;  Location[facility] ENDOSCOPY;  Service: Gastroenterology;;   CATARACT EXTRACTION  01/2014   CHOLECYSTECTOMY     COLONOSCOPY WITH PROPOFOL  N/A 07/30/2019   Procedure: COLONOSCOPY WITH PROPOFOL ;  Surgeon: Elicia Claw, MD;  Location: [facility];  Service: Gastroenterology;  Laterality: N/A;   ESOPHAGOGASTRODUODENOSCOPY N/A 07/27/2019   Procedure: ESOPHAGOGASTRODUODENOSCOPY (EGD);  Surgeon: Rosalie Kitchens, MD;  Location: [facility];  Service: Endoscopy;  Laterality: N/A;   ESOPHAGOGASTRODUODENOSCOPY N/A 07/26/2020   Procedure: ESOPHAGOGASTRODUODENOSCOPY (EGD);  Surgeon: Burnette Fallow, MD;   Location: [facility];  Service: Endoscopy;  Laterality: N/A;   ESOPHAGOGASTRODUODENOSCOPY (EGD) WITH PROPOFOL  N/A 08/02/2019   Procedure: ESOPHAGOGASTRODUODENOSCOPY (EGD) WITH PROPOFOL ;  Surgeon: Elicia Claw, MD;  Location: [facility]OS[facility];  Service: Gastroenterology;  Laterality: N/A;   ESOPHAGOGASTRODUODENOSCOPY (EGD) WITH PROPOFOL  N/A 12/23/2022   Procedure: ESOPHAGOGASTRODUODENOSCOPY (EGD) WITH PROPOFOL ;  Surgeon: San Sandor GAILS, DO;  Location: [facility] ENDOSCOPY;  Service: Gastroenterology;  Laterality: N/A;   ESOPHAGOGASTRODUODENOSCOPY (EGD) WITH PROPOFOL  N/A 04/28/2023   Procedure: ESOPHAGOGASTRODUODENOSCOPY (EGD) WITH PROPOFOL ;  Surgeon: Federico Rosario BROCKS, MD;  Location: [facility] ENDOSCOPY;  Service: Gastroenterology;  Laterality: N/A;   FISTULA SUPERFICIALIZATION Left 12/31/2022   Procedure: LEFT ARM FISTULA TRANSPOSITION;  Surgeon: Sheree Penne Bruckner, MD;  Location: [facility] OR;  Service: Vascular;  Laterality: Left;   GIVENS CAPSULE STUDY N/A 12/23/2022   Procedure: GIVENS CAPSULE STUDY;  Surgeon: San Sandor GAILS, DO;  Location: [facility] ENDOSCOPY;  Service: Gastroenterology;  Laterality: N/A;   HEMOSTASIS CLIP PLACEMENT  08/02/2019   Procedure: HEMOSTASIS CLIP PLACEMENT;  Surgeon: Elicia Claw, MD;  Location: [facility] ENDOSCOPY;  Service: Gastroenterology;;   HOT HEMOSTASIS N/A 12/23/2022   Procedure: HOT HEMOSTASIS (ARGON PLASMA COAGULATION/BICAP);  Surgeon: San Sandor GAILS, DO;  Location: [facility] ENDOSCOPY;  Service: Gastroenterology;  Laterality: N/A;   INCISION AND DRAINAGE ABSCESS Left 02/16/2024   Procedure: INCISION AND DRAINAGE, ABSCESS;  Surgeon: Vernetta Berg, MD;  Location: [facility] OR;  Service: General;  Laterality: Left;  LEFT THIGH ABSCESS   IR FLUORO GUIDE CV LINE RIGHT  06/24/2022   IR REMOVAL TUN CV CATH W/O FL  03/30/2024   IR US  GUIDE VASC ACCESS RIGHT  06/24/2022   POLYPECTOMY  07/30/2019   Procedure: POLYPECTOMY;  Surgeon: Elicia Claw, MD;  Location: [facility] ENDOSCOPY;  Service:  Gastroenterology;;   POLYPECTOMY  08/02/2019   Procedure: POLYPECTOMY;  Surgeon: Elicia Claw, MD;  Location: [facility];  Service: Gastroenterology;;   HARLEY DILATION N/A 04/28/2023   Procedure: HARLEY DILATION;  Surgeon: Federico Rosario BROCKS, MD;  Location: [facility] ENDOSCOPY;  Service: Gastroenterology;  Laterality: N/A;   STUMP REVISION Right 02/02/2023   Procedure: REVISION RIGHT BELOW KNEE AMPUTATION;  Surgeon: Harden Jerona GAILS, MD;  Location: [facility] OR;  Service: Orthopedics;  Laterality: Right;    Allergies: Asa [aspirin ], Bentyl  [dicyclomine ], Gabapentin , Isovue  [iopamidol ], Nsaids, Penicillins, Reglan  [metoclopramide ], Valium  [diazepam ], Zestril [lisinopril], Tolectin [tolmetin], Aspartame and phenylalanine, Flexeril  [cyclobenzaprine ], Hibiclens  [chlorhexidine  gluconate], Oxycontin  [oxycodone ], Rifamycins, Tylenol  [acetaminophen ], and Ultram  [tramadol ]  Medications: Prior to Admission medications  Medication Sig Start Date End Date Taking? Authorizing Provider  albuterol  (PROVENTIL ) (2.5 MG/3ML) 0.083% nebulizer solution Take 3 mLs (2.5 mg total) by nebulization every 6 (six) hours as needed for wheezing or shortness of breath. 04/06/19  Yes Arloa Suzen RAMAN, NP  allopurinol  (ZYLOPRIM ) 100 MG tablet Take 1 tablet (100 mg total) by mouth 2 (two) times daily. 11/30/22  Yes Lue Fallow BROCKS, MD  ascorbic acid  (VITAMIN C ) 500 MG tablet Take 500 mg by mouth daily.   Yes [provider]  atorvastatin  (LIPITOR) 10 MG tablet Take 10 mg by mouth every evening. 12/03/21  Yes [provider]  b complex-vitamin c -folic acid  (NEPHRO-VITE) 0.8 MG TABS tablet Take 1 tablet  by mouth at bedtime. 06/17/23  Yes Danton Reyes DASEN, MD  busPIRone  (BUSPAR ) 10 MG tablet Take 10 mg by mouth 3 (three) times daily.   Yes [provider]  calcium  carbonate (TUMS - DOSED IN MG ELEMENTAL CALCIUM ) 500 MG chewable tablet Chew 1 tablet (200 mg of elemental calcium  total) by mouth 2 (two) times daily  as needed for indigestion or heartburn. 06/17/23  Yes Danton Reyes DASEN, MD  cetirizine  (ZYRTEC ) 10 MG tablet Take 10 mg by mouth daily. 08/05/22  Yes [provider]  cholestyramine  light (PREVALITE ) 4 g packet Take 1 packet (4 g total) by mouth 2 (two) times daily. 06/17/23  Yes Danton Reyes DASEN, MD  diclofenac  Sodium (VOLTAREN ) 1 % GEL Apply 2 g topically 4 (four) times daily. 06/17/23  Yes Danton Reyes DASEN, MD  famotidine  (PEPCID ) 20 MG tablet Take 20 mg by mouth daily before breakfast.   Yes [provider]  fluticasone  (FLONASE ) 50 MCG/ACT nasal spray Place 2 sprays into both nostrils daily as needed for allergies or rhinitis. Patient taking differently: Place 2 sprays into both nostrils in the morning and at bedtime. 12/19/18  Yes Rai, Ripudeep K, MD  folic acid  (FOLVITE ) 1 MG tablet Take 1 tablet (1 mg total) by mouth daily. 01/10/23  Yes Danford, Lonni SQUIBB, MD  hydrocerin (EUCERIN) CREA Apply 1 Application topically 2 (two) times daily. Apply to L leg and foot 2 times daily 02/25/24  Yes Ghimire, Donalda HERO, MD  HYDROcodone -acetaminophen  (NORCO/VICODIN) 5-325 MG tablet Take 1-2 tablets by mouth every 6 (six) hours as needed for moderate pain (pain score 4-6). 02/25/24  Yes Ghimire, Donalda HERO, MD  hydrocortisone  cream 1 % Apply 1 Application topically 2 (two) times daily.   Yes [provider]  HYDROmorphone  (DILAUDID ) 2 MG tablet Take 1 tablet (2 mg total) by mouth every 6 (six) hours as needed for severe pain (pain score 7-10). 02/25/24  Yes Ghimire, Donalda HERO, MD  hydrOXYzine  (ATARAX ) 25 MG tablet Take 1 tablet (25 mg total) by mouth every 4 (four) hours as needed for anxiety. 06/17/23  Yes Danton Reyes DASEN, MD  hyoscyamine  (LEVSIN  SL) 0.125 MG SL tablet Place 1 tablet (0.125 mg total) under the tongue 3 (three) times daily. 06/17/23  Yes Danton Reyes DASEN, MD  loperamide  (IMODIUM ) 2 MG capsule Take 1 capsule (2 mg total) by mouth every 6 (six) hours. 06/17/23   Yes Danton Reyes DASEN, MD  methocarbamol  (ROBAXIN ) 500 MG tablet Take 1 tablet (500 mg total) by mouth every 6 (six) hours as needed for muscle spasms. 01/10/23  Yes Danford, Lonni SQUIBB, MD  midodrine  (PROAMATINE ) 10 MG tablet Take 1 tablet (10 mg total) by mouth every Monday, Wednesday, and Friday with hemodialysis. 06/17/23  Yes Danton Reyes DASEN, MD  mirtazapine  (REMERON ) 15 MG tablet Take 15 mg by mouth at bedtime. 10/18/22  Yes [provider]  nicotine (NICODERM CQ - DOSED IN MG/24 HR) 7 mg/24hr patch Place 7 mg onto the skin daily.   Yes [provider]  Nutritional Supplements (,FEEDING SUPPLEMENT, PROSOURCE PLUS) liquid Take 30 mLs by mouth 2 (two) times daily between meals. 01/10/23  Yes Danford, Lonni SQUIBB, MD  Nystatin (GERHARDT'S BUTT CREAM) CREA Apply 1 Application topically 3 (three) times daily. 02/25/24  Yes Ghimire, Donalda HERO, MD  ondansetron  (ZOFRAN ) 4 MG tablet Take 4 mg by mouth every 8 (eight) hours as needed for nausea or vomiting.   Yes [provider]  pantoprazole  (PROTONIX ) 40 MG  tablet Take 1 tablet (40 mg total) by mouth 2 (two) times daily. 02/18/23  Yes Raulkar, Sven SQUIBB, MD  promethazine  (PHENERGAN ) 25 MG tablet Take 1 tablet (25 mg total) by mouth 4 (four) times daily -  before meals and at bedtime. 06/17/23  Yes Danton Reyes DASEN, MD  risperiDONE  (RISPERDAL  M-TABS) 1 MG disintegrating tablet Take 1 tablet (1 mg total) by mouth at bedtime. 05/04/23  Yes Sebastian Toribio GAILS, MD  saccharomyces boulardii (FLORASTOR) 250 MG capsule Take 250 mg by mouth in the morning, at noon, and at bedtime.   Yes [provider]  sevelamer carbonate (RENVELA) 800 MG tablet Take 1,600 mg by mouth 3 (three) times daily with meals.   Yes [provider]  traZODone  (DESYREL ) 50 MG tablet Take 1 tablet (50 mg total) by mouth at bedtime. Patient taking differently: Take 75 mg by mouth at bedtime. 06/17/23  Yes Danton Reyes DASEN, MD     Family  History  Problem Relation Age of Onset   Diabetes Mother    Diabetes Father    Heart disease Father    Diabetes Sister    Congestive Heart Failure Sister 39   Diabetes Brother     Social History   Socioeconomic History   Marital status: Married    Spouse name: Not on file   Number of children: Not on file   Years of education: Not on file   Highest education level: Not on file  Occupational History   Not on file  Tobacco Use   Smoking status: Never   Smokeless tobacco: Never  Vaping Use   Vaping status: Never Used  Substance and Sexual Activity   Alcohol use: No   Drug use: No   Sexual activity: Not Currently    Birth control/protection: None  Other Topics Concern   Not on file  Social History Narrative   ** Merged History Encounter **       Social Drivers of Health   Tobacco Use: Low Risk (03/18/2024)   Patient History    Smoking Tobacco Use: Never    Smokeless Tobacco Use: Never    Passive Exposure: Not on file  Financial Resource Strain: Low Risk (03/23/2021)   Received from [facility])   Financial Resource Strain  Food Insecurity: No Food Insecurity (03/18/2024)   Epic    Worried About Programme Researcher, Broadcasting/film/video in the Last Year: Never true    Ran Out of Food in the Last Year: Never true  Transportation Needs: No Transportation Needs (03/18/2024)   Epic    Lack of Transportation (Medical): No    Lack of Transportation (Non-Medical): No  Physical Activity: Not on file  Stress: Low Risk (03/23/2021)   Received [facility])   Stress    Over the last 2 weeks, how often have you been bothered by the following problems: feeling nervous, anxious, on edge?: Not at all    Over the last 2 weeks, how often have you been bothered by the following problems: Not being able to stop or control worrying?: Not at all  Social Connections: Low Risk (03/23/2021)   Received from [facility])   Social Connections    How often  do you feel that you lack companionship?: Hardly ever    How often do you feel left out?: Hardly Ever    How often do you feel isolated from others?: Hardly ever  Depression (PHQ2-9): Medium Risk (03/16/2023)   Depression (  PHQ2-9)    PHQ-2 Score: 6  Alcohol Screen: Not on file  Housing: Low Risk (03/18/2024)   Epic    Unable to Pay for Housing in the Last Year: No    Number of Times Moved in the Last Year: 0    Homeless in the Last Year: No  Utilities: Not At Risk (03/18/2024)   Epic    Threatened with loss of utilities: No  Recent Concern: Utilities - At Risk (02/18/2024)   Epic    Threatened with loss of utilities: Already shut off  Health Literacy: Not on file     Review of Systems: A 12 point ROS discussed and pertinent positives are indicated in the HPI above.  All other systems are negative.  Vital Signs: BP 96/61 (BP Location: Right Wrist)   Pulse 86   Temp 99.8 F (37.7 C) (Axillary)   Resp 19   Ht 5' 6 (1.676 m)   Wt (S) 229 lb 11.5 oz (104.2 kg) Comment: Bed Scale  LMP 10/10/2012   SpO2 98%   BMI 37.08 kg/m   Advance Care Plan: No documents on file  Physical Exam Vitals and nursing note reviewed.  Constitutional:      Comments: Sleepy but alert and answering questions  HENT:     Mouth/Throat:     Mouth: Mucous membranes are moist.     Pharynx: Oropharynx is clear.  Cardiovascular:     Rate and Rhythm: Normal rate and regular rhythm.  Pulmonary:     Effort: Pulmonary effort is normal.     Breath sounds: Normal breath sounds.  Abdominal:     Palpations: Abdomen is soft.     Tenderness: There is no abdominal tenderness.  Skin:    General: Skin is warm and dry.  Neurological:     Mental Status: She is alert. Mental status is at baseline.     Imaging: IR Removal Tun Cv Cath W/O FL Result Date: 03/30/2024 INDICATION: History of ESRD requiring hemodialysis. Previous tunneled HD catheter placed 06/24/2022. Currently admitted with bacteremia. Request for  tunneled HD catheter removal for line holiday. EXAM: REMOVAL TUNNELED CENTRAL VENOUS CATHETER MEDICATIONS: 3 mL 1% lidocaine  ANESTHESIA/SEDATION: None FLUOROSCOPY: None COMPLICATIONS: None immediate. PROCEDURE: Informed written consent was obtained from the patient after a thorough discussion of the procedural risks, benefits and alternatives. All questions were addressed. Maximal Sterile Barrier Technique was utilized including caps, mask, sterile gowns, sterile gloves, sterile drape, hand hygiene and skin antiseptic. A timeout was performed prior to the initiation of the procedure. The patient's right chest and catheter was prepped and draped in a normal sterile fashion. Heparin  was removed from both ports of catheter. 1% lidocaine  was used for local anesthesia. Using gentle blunt dissection the cuff of the catheter was exposed and the catheter was removed in it's entirety. Pressure was held till hemostasis was obtained. A sterile dressing was applied. The patient tolerated the procedure well with no immediate complications. IMPRESSION: Successful catheter removal as described above. Performed by: Shariff Lasky, PA-C Electronically Signed   By: CHRISTELLA.  Shick M.D.   On: 03/30/2024 16:04   DG Chest Port 1 View Result Date: 03/25/2024 CLINICAL DATA:  [name]. EXAM: PORTABLE CHEST 1 VIEW COMPARISON:  Chest radiograph dated 03/22/2024. FINDINGS: Right-sided catheter in similar position. Interval removal of the feeding tube. Bilateral interstitial densities similar to prior radiograph and may represent chronic changes, reactive airway disease, edema, or atypical pneumonia. No focal consolidation, pleural effusion, or pneumothorax. Stable cardiac silhouette.  No acute osseous pathology. IMPRESSION: No significant interval change. Electronically Signed   By: Vanetta Chou M.D.   On: 03/25/2024 16:34   DG Chest Port 1 View Result Date: 03/22/2024 EXAM: 1 VIEW(S) XRAY OF THE CHEST 03/22/2024 12:07:00 PM  COMPARISON: 03/16/2024 CLINICAL HISTORY: SOB (shortness of breath) FINDINGS: LINES, TUBES AND DEVICES: Right chest wall dual lumen central venous catheter in place with tip terminating over the superior right atrium. Enteric tube in place coursing through the chest to the abdomen beyond the field-of-view. LUNGS AND PLEURA: Low lung volumes. No focal pulmonary opacity. No pleural effusion. No pneumothorax. HEART AND MEDIASTINUM: No acute abnormality of the cardiac and mediastinal silhouettes. BONES AND SOFT TISSUES: Degenerative changes of bilateral shoulders. Bilateral high-riding humeral heads with significant decrease in acromiohumeral interval. No acute osseous abnormality. IMPRESSION: 1. No acute findings. 2. Low lung volumes. 3. Advanced arthropathic changes involving both shoulders with suspected bilateral rotator cuff pathology resulting in high-riding humeral heads. Electronically signed by: Waddell Calk MD 03/22/2024 03:47 PM EST RP Workstation: [id number]   ECHOCARDIOGRAM LIMITED Result Date: 03/18/2024    ECHOCARDIOGRAM LIMITED REPORT   Patient Name:   ALLAINA BROTZMAN Date of Exam: 03/18/2024 Medical Rec #:  [id number]            Height:       66.0 in Accession #:    [id number]           Weight:       227.3 lb Date of Birth:  May 25, 1965            BSA:          2.112 m Patient Age:    58 years             BP:           112/46 mmHg Patient Gender: F                    HR:           93 bpm. Exam Location:  Inpatient Procedure: Limited Echo, Cardiac Doppler and Limited Color Doppler (Both            Spectral and Color Flow Doppler were utilized during procedure). Indications:    Shock  History:        Patient has prior history of Echocardiogram examinations, most                 recent 03/17/2024. CHF, Abnormal ECG, Stroke, Arrythmias:Atrial                 Fibrillation; Risk Factors:Hypertension and Diabetes.  Sonographer:    Sherlean Dubin Referring Phys: [id number] TORIBIO JAYSON SHARPS  Sonographer  Comments: Image acquisition challenging due to patient body habitus and Image acquisition challenging due to respiratory motion. IMPRESSIONS  1. Left ventricular ejection fraction, by estimation, is 60 to 65%. The left ventricle has normal function. There is mild concentric left ventricular hypertrophy. Left ventricular diastolic parameters were normal.  2. Basal and apical function preserved but unable to assess free wall function due to poor visualization. Right ventricular systolic function was not well visualized. The right ventricular size is not well visualized.  3. There is a small pericardial effusion located posteriorly (#21) . a small pericardial effusion is present. The pericardial effusion is posterior to the left ventricle.  4. The mitral valve is normal in structure. No evidence of mitral valve regurgitation. No evidence of mitral stenosis.  5. Calcified nodule  in left coronary cusp. The aortic valve is abnormal. Aortic valve regurgitation is not visualized. No aortic stenosis is present.  6. The inferior vena cava is dilated in size with <50% respiratory variability, suggesting right atrial pressure of 15 mmHg. Comparison(s): No significant change from prior study. Prior images reviewed side by side. FINDINGS  Left Ventricle: Left ventricular ejection fraction, by estimation, is 60 to 65%. The left ventricle has normal function. The left ventricular internal cavity size was normal in size. There is mild concentric left ventricular hypertrophy. Left ventricular diastolic parameters were normal. Right Ventricle: Basal and apical function preserved but unable to assess free wall function due to poor visualization. The right ventricular size is not well visualized. Right vetricular wall thickness was not well visualized. Right ventricular systolic  function was not well visualized. Left Atrium: Left atrial size was normal in size. Right Atrium: Right atrial size was normal in size. Pericardium: There is a  small pericardial effusion located posteriorly (#21). A small pericardial effusion is present. The pericardial effusion is posterior to the left ventricle. Mitral Valve: The mitral valve is normal in structure. No evidence of mitral valve stenosis. Tricuspid Valve: The tricuspid valve is not well visualized. Tricuspid valve regurgitation is not demonstrated. No evidence of tricuspid stenosis. Aortic Valve: Calcified nodule in left coronary cusp. The aortic valve is abnormal. Aortic valve regurgitation is not visualized. No aortic stenosis is present. Venous: The inferior vena cava is dilated in size with less than 50% respiratory variability, suggesting right atrial pressure of 15 mmHg. IAS/Shunts: The interatrial septum was not well visualized. LEFT VENTRICLE PLAX 2D LVIDd:         5.20 cm   Diastology LVIDs:         3.50 cm   LV e' medial:    6.64 cm/s LV PW:         1.10 cm   LV E/e' medial:  13.3 LV IVS:        1.00 cm   LV e' lateral:   12.70 cm/s LVOT diam:     2.30 cm   LV E/e' lateral: 6.9 LV SV:         86 LV SV Index:   41 LVOT Area:     4.15 cm  RIGHT VENTRICLE             IVC RV S prime:     11.90 cm/s  IVC diam: 2.20 cm LEFT ATRIUM         Index LA diam:    4.10 cm 1.94 cm/m  AORTIC VALVE LVOT Vmax:   99.60 cm/s LVOT Vmean:  66.000 cm/s LVOT VTI:    0.208 m  AORTA Ao Root diam: 3.30 cm MITRAL VALVE MV Area (PHT): 5.27 cm    SHUNTS MV Decel Time: 144 msec    Systemic VTI:  0.21 m MV E velocity: 88.00 cm/s  Systemic Diam: 2.30 cm MV A velocity: 99.00 cm/s MV E/A ratio:  0.89 Franck Azobou Tonleu Electronically signed by Joelle Ren Ny Signature Date/Time: 03/18/2024/10:27:49 AM    Final    ECHOCARDIOGRAM COMPLETE Result Date: 03/17/2024    ECHOCARDIOGRAM REPORT   Patient Name:   ALISHA BACUS Date of Exam: 03/17/2024 Medical Rec #:  [id number]            Height:       66.0 in Accession #:    [id number]           Weight:  227.3 lb Date of Birth:  [birth year]            BSA:           2.112 m Patient Age:    58 years             BP:           77/44 mmHg Patient Gender: F                    HR:           117 bpm. Exam Location:  Inpatient Procedure: 2D Echo, Cardiac Doppler and Color Doppler (Both Spectral and Color            Flow Doppler were utilized during procedure). STAT ECHO Indications:    Shock. R55 Syncope  History:        Patient has prior history of Echocardiogram examinations, most                 recent 09/16/2022. CHF, Abnormal ECG, Stroke, Arrythmias:Atrial                 Fibrillation, Signs/Symptoms:Altered Mental Status and                 Bacteremia; Risk Factors:Hypertension and Diabetes. ESRD.  Sonographer:    Ellouise Mose RDCS Referring Phys: [id number] JAMIE H DAGENHART  Sonographer Comments: Technically difficult study due to poor echo windows and patient is obese. Image acquisition challenging due to patient body habitus. Patient soiled, patient screams with pressure of probe. IMPRESSIONS  1. Left ventricular ejection fraction, by estimation, is 60 to 65%. The left ventricle has normal function. The left ventricle has no regional wall motion abnormalities. There is mild left ventricular hypertrophy. Indeterminate diastolic filling due to E-A fusion.  2. Right ventricular systolic function is moderately reduced. The right ventricular size is mildly enlarged. There is normal pulmonary artery systolic pressure. The estimated right ventricular systolic pressure is 19.8 mmHg.  3. The mitral valve is normal in structure. No evidence of mitral valve regurgitation. No evidence of mitral stenosis.  4. Nodular calcification of the left coronary cusp . The aortic valve is tricuspid. There is mild calcification of the aortic valve. Aortic valve regurgitation is not visualized. Aortic valve sclerosis is present, with no evidence of aortic valve stenosis.  5. The inferior vena cava is normal in size with greater than 50% respiratory variability, suggesting right atrial pressure of 3 mmHg.  FINDINGS  Left Ventricle: Left ventricular ejection fraction, by estimation, is 60 to 65%. The left ventricle has normal function. The left ventricle has no regional wall motion abnormalities. The left ventricular internal cavity size was normal in size. There is  mild left ventricular hypertrophy. Indeterminate diastolic filling due to E-A fusion. Right Ventricle: The right ventricular size is mildly enlarged. No increase in right ventricular wall thickness. Right ventricular systolic function is moderately reduced. There is normal pulmonary artery systolic pressure. The tricuspid regurgitant velocity is 2.05 m/s, and with an assumed right atrial pressure of 3 mmHg, the estimated right ventricular systolic pressure is 19.8 mmHg. Left Atrium: Left atrial size was normal in size. Right Atrium: Right atrial size was normal in size. Pericardium: There is no evidence of pericardial effusion. Mitral Valve: The mitral valve is normal in structure. No evidence of mitral valve regurgitation. No evidence of mitral valve stenosis. Tricuspid Valve: The tricuspid valve is normal in structure. Tricuspid valve regurgitation is mild . No evidence of  tricuspid stenosis. Aortic Valve: Nodular calcification of the left coronary cusp. The aortic valve is tricuspid. There is mild calcification of the aortic valve. Aortic valve regurgitation is not visualized. Aortic valve sclerosis is present, with no evidence of aortic valve stenosis. Pulmonic Valve: The pulmonic valve was normal in structure. Pulmonic valve regurgitation is not visualized. No evidence of pulmonic stenosis. Aorta: The aortic root and ascending aorta are structurally normal, with no evidence of dilitation. Venous: The inferior vena cava is normal in size with greater than 50% respiratory variability, suggesting right atrial pressure of 3 mmHg. IAS/Shunts: No atrial level shunt detected by color flow Doppler. Additional Comments: 3D was performed not requiring image  post processing on an independent workstation and was indeterminate.  LEFT VENTRICLE PLAX 2D LVIDd:         4.50 cm     Diastology LVIDs:         3.10 cm     LV e' medial:    11.10 cm/s LV PW:         1.40 cm     LV E/e' medial:  9.3 LV IVS:        1.20 cm     LV e' lateral:   15.00 cm/s LVOT diam:     2.40 cm     LV E/e' lateral: 6.9 LV SV:         84 LV SV Index:   40 LVOT Area:     4.52 cm  LV Volumes (MOD) LV vol d, MOD A2C: 68.1 ml LV vol d, MOD A4C: 78.1 ml LV vol s, MOD A2C: 21.7 ml LV vol s, MOD A4C: 27.2 ml LV SV MOD A2C:     46.4 ml LV SV MOD A4C:     78.1 ml LV SV MOD BP:      48.5 ml RIGHT VENTRICLE             IVC RV S prime:     12.40 cm/s  IVC diam: 2.00 cm TAPSE (M-mode): 1.6 cm LEFT ATRIUM             Index        RIGHT ATRIUM           Index LA diam:        3.00 cm 1.42 cm/m   RA Area:     12.70 cm LA Vol (A2C):   32.6 ml 15.44 ml/m  RA Volume:   29.90 ml  14.16 ml/m LA Vol (A4C):   40.1 ml 18.99 ml/m LA Biplane Vol: 36.9 ml 17.48 ml/m  AORTIC VALVE LVOT Vmax:   130.00 cm/s LVOT Vmean:  85.900 cm/s LVOT VTI:    0.186 m  AORTA Ao Root diam: 3.40 cm MITRAL VALVE                TRICUSPID VALVE MV Area (PHT): 5.54 cm     TR Peak grad:   16.8 mmHg MV Decel Time: 137 msec     TR Vmax:        205.00 cm/s MV E velocity: 103.00 cm/s MV A velocity: 117.00 cm/s  SHUNTS MV E/A ratio:  0.88         Systemic VTI:  0.19 m                             Systemic Diam: 2.40 cm Maude Emmer MD Electronically signed by Maude Emmer MD Signature Date/Time:  03/17/2024/8:58:09 AM    Final    CT ABDOMEN PELVIS WO CONTRAST Result Date: 03/17/2024 EXAM: CT ABDOMEN AND PELVIS WITHOUT CONTRAST 03/17/2024 02:15:11 AM TECHNIQUE: CT of the abdomen and pelvis was performed without the administration of intravenous contrast. Multiplanar reformatted images are provided for review. Automated exposure control, iterative reconstruction, and/or weight-based adjustment of the mA/kV was utilized to reduce the radiation dose to as  low as reasonably achievable. COMPARISON: 02/16/2024 CLINICAL HISTORY: Abdominal pain, acute, nonlocalized. FINDINGS: LIMITATIONS/ARTIFACTS: Motion degraded images. LOWER CHEST: Linear scarring/atelectasis at the lung bases. LIVER: The liver is unremarkable. GALLBLADDER AND BILE DUCTS: Status post cholecystectomy. No biliary ductal dilatation. SPLEEN: No acute abnormality. PANCREAS: No acute abnormality. ADRENAL GLANDS: No acute abnormality. KIDNEYS, URETERS AND BLADDER: No stones in the kidneys or ureters. No hydronephrosis. No perinephric or periureteral stranding. Urinary bladder is unremarkable. GI AND BOWEL: Stomach demonstrates no acute abnormality. There is no bowel obstruction. PERITONEUM AND RETROPERITONEUM: No ascites. No free air. VASCULATURE: Aorta is normal in caliber. Mild atherosclerotic calcifications of the abdominal aorta. LYMPH NODES: No lymphadenopathy. REPRODUCTIVE ORGANS: Mildly heterogeneous uterus, reflecting uterine fibroids. 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 BONES AND SOFT TISSUES: Mild degenerative changes of the lower thoracic and lower lumbar spine. Procedural changes in the left anterior upper thigh (image 493). Body wall edema. No acute osseous abnormality. No focal soft tissue abnormality. IMPRESSION: 1. No acute findings in the abdomen or pelvis. Electronically signed by: Pinkie Pebbles MD MD 03/17/2024 02:25 AM EST RP Workstation: [id number]   DG Chest Portable 1 View Result Date: 03/16/2024 EXAM: 1 VIEW XRAY OF THE CHEST 03/16/2024 11:04:00 PM COMPARISON: 03/16/2024 CLINICAL HISTORY: central line placement FINDINGS: LINES, TUBES AND DEVICES: Right chest wall dialysis catheter in place with tip overlying right atrium. LUNGS AND PLEURA: Low lung volumes. No focal pulmonary opacity. No pleural effusion. No pneumothorax. HEART AND MEDIASTINUM: No acute abnormality of the cardiac and  mediastinal silhouettes. BONES AND SOFT TISSUES: Degenerative changes of bilateral shoulders. No acute osseous abnormality. IMPRESSION: 1. Right chest wall dialysis catheter with tip in the right atrium. Electronically signed by: Pinkie Pebbles MD MD 03/16/2024 11:08 PM EST RP Workstation: [id number]   DG Chest Portable 1 View Result Date: 03/16/2024 CLINICAL DATA:  Syncopal episodes. EXAM: PORTABLE CHEST 1 VIEW COMPARISON:  February 17, 2024 FINDINGS: There is stable right-sided venous catheter positioning. The left-sided venous catheter seen on the prior study has been removed. The heart size and mediastinal contours are within normal limits. Low lung volumes are noted. Mild atelectatic changes are seen within the bilateral lung bases. No pleural effusion or pneumothorax is identified. The visualized skeletal structures are unremarkable. IMPRESSION: Low lung volumes with mild bibasilar atelectasis. Electronically Signed   By: Suzen Dials M.D.   On: 03/16/2024 18:19    Labs:  CBC: Recent Labs    03/27/24 1328 03/29/24 1230 03/30/24 0846 03/30/24 1454  WBC 23.2* 19.4* 15.1* 16.1*  HGB 8.1* 8.1* 6.7* 9.7*  HCT 24.9* 25.7* 20.4* 28.3*  PLT 370 390 337 324    COAGS: Recent Labs    03/17/24 1835 03/18/24 1639  INR 1.9* 1.8*  APTT 51* 54*    BMP: Recent Labs    03/24/24 1618 03/27/24 1327 03/29/24 1230 03/30/24 0846  NA 135 139 138 140  K 3.6 3.7 3.8 3.5  CL 101 105 102 106  CO2 24 24 24 23   GLUCOSE 139* 162* 163* 85  BUN 19 30* 22* 25*  CALCIUM  7.1* 7.1* 7.4* 8.0*  CREATININE 2.33* 4.33* 3.84* 4.31*  GFRNONAA 23* 11* 13* 11*    LIVER FUNCTION TESTS: Recent Labs    02/14/24 1402 02/15/24 0836 02/17/24 0257 03/16/24 1711 03/17/24 0828 03/18/24 1639 03/19/24 0347 03/19/24 1643 03/24/24 1618 03/27/24 1327 03/29/24 1230 03/30/24 0846  BILITOT 0.9 0.9  --  0.4  --  0.7 0.7  --   --   --   --   --   AST 13* 10*  --  35  --   --  15  --   --   --   --   --    ALT 10 8  --  <5  --   --  <5  --   --   --   --   --   ALKPHOS 109 87  --  93  --   --  75  --   --   --   --   --   PROT 6.3* 4.8*  --  5.0*  --   --  4.4*  --   --   --   --   --   ALBUMIN  1.7* <1.5*   < > 1.9*   < >  --  1.9*  1.9*   < > 1.8* 1.7* 1.9* 2.6*   < > = values in this interval not displayed.    TUMOR MARKERS: No results for input(s): AFPTM, CEA, CA199, CHROMGRNA in the last 8760 hours.  Assessment and Plan:  Deborah [name] is a 59 y.o. female with hx of ESRD requiring hemodialysis. Originally presented to the hospital on 03/16/24 with syncope, found to he hemodynamically unstable. She was diagnosed with sepsis and bacteremia. Due to bacteremia, pt had TDC removed 03/30/24 for line holiday. IR consulted for placing tunneled hd catheter after this period.   IR planning tunneled HD catheter placement early next week - Pt to be NPO at midnight 04/02/24  Risks and benefits discussed with the patient including, but not limited to bleeding, infection, vascular injury, pneumothorax which may require chest tube placement, air embolism or even death. All of the patient's questions were answered, patient is agreeable to proceed. Consent signed and in chart.   Thank you for allowing our service to participate in AYLIANA CASCIANO 's care.  Electronically Signed: Kimble VEAR Clas, PA-C   03/31/2024, 12:22 PM      I spent a total of 20 Minutes    in face to face in clinical consultation, greater than 50% of which was counseling/coordinating care for tunneled hemodialysis catheter placement.    "

## 2024-03-31 NOTE — Progress Notes (Signed)
 [facility] night cross cover note:   I was notified by the patient's RN that the patient spiked a new to fever, with most recent temperature reported to be 101.0.  Per chart review, while it appears that she has had some intermittent mildly elevated temperatures over the last day, it appears that this is her first objective fever meeting threshold for fever in at least the last 30 hours.  Most recent vital signs also notable for heart rates in the 90s; systolic pressures in the mid to high 90s, consistent with systolic blood pressures throughout yesterday's dayshift, respiratory rate 16-21, and oxygen saturation 99 to 100% on room air.  Hospital course notable for Klebsiella bacteremia.  Currently on meropenem .  As this appears to be the patient's first subjective fever in at least the last 30 hours, I have ordered updated chest x-ray, procalcitonin level.  I have confirmed that the patient is an uric at baseline.  Additionally, there are existing orders for rechecking of blood cultures x 2 to occur this morning.  She has an existing order for prn acetaminophen  for fever, but is currently refusing the acetaminophen .  .    Eva Pore, DO Hospitalist

## 2024-03-31 NOTE — Progress Notes (Signed)
 " [facility] Progress Note   Subjective:   Seen in room. Drowsy but answering simple questions. No CP/dyspnea. TDC removed after dialysis yesterday.  Objective Vitals:   03/31/24 0650 03/31/24 0700 03/31/24 0705 03/31/24 0800  BP:  (!) 100/52    Pulse: 91 86    Resp:  (!) 23 19   Temp:    99.3 F (37.4 C)  TempSrc:    Axillary  SpO2: 100% 98%    Weight:      Height:       Physical Exam General: Chronically ill appearing woman, NAD. [place] O2 in place Heart: RRR Lungs: CTA anteriorly Abdomen: soft, no wounds present Extremities: Trace BLE edema, RUE with 2+ edema Dialysis Access: none (TDC removed, clean bandage)  Additional Objective Labs: Basic Metabolic Panel: Recent Labs  Lab 03/27/24 1327 03/29/24 1230 03/30/24 0846  NA 139 138 140  K 3.7 3.8 3.5  CL 105 102 106  CO2 24 24 23   GLUCOSE 162* 163* 85  BUN 30* 22* 25*  CREATININE 4.33* 3.84* 4.31*  CALCIUM  7.1* 7.4* 8.0*  PHOS 4.5 4.3 4.3   Liver Function Tests: Recent Labs  Lab 03/27/24 1327 03/29/24 1230 03/30/24 0846  ALBUMIN  1.7* 1.9* 2.6*   CBC: Recent Labs  Lab 03/27/24 1328 03/29/24 1230 03/30/24 0846 03/30/24 1454  WBC 23.2* 19.4* 15.1* 16.1*  NEUTROABS 19.1*  --  11.9*  --   HGB 8.1* 8.1* 6.7* 9.7*  HCT 24.9* 25.7* 20.4* 28.3*  MCV 97.6 100.8* 98.6 92.5  PLT 370 390 337 324   Blood Culture    Component Value Date/Time   SDES BLOOD RIGHT ANTECUBITAL 03/16/2024 1949   SPECREQUEST  03/16/2024 1949    BOTTLES DRAWN AEROBIC AND ANAEROBIC Blood Culture results may not be optimal due to an inadequate volume of blood received in culture bottles   CULT (A) 03/16/2024 1949    KLEBSIELLA PNEUMONIAE Confirmed Extended Spectrum Beta-Lactamase Producer (ESBL).  In bloodstream infections from ESBL organisms, carbapenems are preferred over piperacillin /tazobactam. They are shown to have a lower risk of mortality.    REPTSTATUS 03/19/2024 FINAL 03/16/2024 1949   Studies/Results: IR  Removal Tun Cv Cath W/O FL Result Date: 03/30/2024 INDICATION: History of ESRD requiring hemodialysis. Previous tunneled HD catheter placed 06/24/2022. Currently admitted with bacteremia. Request for tunneled HD catheter removal for line holiday. EXAM: REMOVAL TUNNELED CENTRAL VENOUS CATHETER MEDICATIONS: 3 mL 1% lidocaine  ANESTHESIA/SEDATION: None FLUOROSCOPY: None COMPLICATIONS: None immediate. PROCEDURE: Informed written consent was obtained from the patient after a thorough discussion of the procedural risks, benefits and alternatives. All questions were addressed. Maximal Sterile Barrier Technique was utilized including caps, mask, sterile gowns, sterile gloves, sterile drape, hand hygiene and skin antiseptic. A timeout was performed prior to the initiation of the procedure. The patient's right chest and catheter was prepped and draped in a normal sterile fashion. Heparin  was removed from both ports of catheter. 1% lidocaine  was used for local anesthesia. Using gentle blunt dissection the cuff of the catheter was exposed and the catheter was removed in it's entirety. Pressure was held till hemostasis was obtained. A sterile dressing was applied. The patient tolerated the procedure well with no immediate complications. IMPRESSION: Successful catheter removal as described above. Performed by: Wyatt Pommier, PA-C Electronically Signed   By: CHRISTELLA.  Shick M.D.   On: 03/30/2024 16:04   Medications:  albumin  human 25 g (03/31/24 0442)   ertapenem  500 mg (03/31/24 0122)    sodium chloride    Intravenous Once  sodium chloride    Intravenous Once   sodium chloride    Intravenous Once   amiodarone   400 mg Oral BID   Followed by   NOREEN ON 04/01/2024] amiodarone   200 mg Oral Daily   ascorbic acid   500 mg Oral BID   atorvastatin   10 mg Oral Daily   busPIRone   5 mg Oral TID   collagenase    Topical Daily   darbepoetin (ARANESP ) injection - DIALYSIS  150 mcg Subcutaneous Q Tue-1800   famotidine   20 mg Oral QHS    feeding supplement  1 Container Oral Q24H   fluconazole   200 mg Oral Weekly   heparin  sodium (porcine)  1.9 mL Intracatheter Once   insulin  aspart  0-5 Units Subcutaneous QHS   insulin  aspart  0-9 Units Subcutaneous TID WC   midodrine   20 mg Oral Q8H   multivitamin  1 tablet Oral QHS   mouth rinse  15 mL Mouth Rinse 4 times per day   risperiDONE   1 mg Oral QHS   sodium chloride  flush  10-40 mL Intracatheter Q12H   vancomycin   125 mg Per Tube Daily   Or   vancomycin   125 mg Oral Daily   Dialysis Orders TTS - [facility] 4h, 400/800, EDW 96.1kg, 3K/2.5Ca bath, TDC, no heparin  - Mircera 255mcg IV q 2 weeks (last 1/3) - Hectoral 2mcg IV q HD   Assessment/Plan: ESRD - Usual TTS schedule - s/p CRRT 1/10-1/13 given significant shock - Dialyzed Friday 1/23 prior to [facility] removal. Anticipate next HD to be Mon or Tues (after new line placed)   PAfib - cardiology following, back on PO amiodarone    Hypotension/volume - Septic on admit,  S/p pressors - Now on high dose midodrine  20mg  TID + extra prn with HD. - Still up volume wise, UF as tolerated with HD   Sepsis/ESBL Klebsiella bacteremia - On ertapenem , has been ordered for outpatient (clinic will notify hospital when it arrives) - Blood Cx 03/16/24 positive - TDC is out   Anemia of ESRD - Hgb 9.7, much better - S/p 2U PRBCs on 1/23 - Continue weekly Aranesp    Diarrhea - On empiric PO Vanc given Hx C.diff - Toxin negative on 1/10   Wounds -mgmt per primary service   T2DM - Insulin  per primary  Dispo/GOC - Palliative following.  - Plan is SNF   Izetta Boehringer, PA-C 03/31/2024, 9:24 AM  [facility]    "

## 2024-03-31 NOTE — Progress Notes (Signed)
 Responded to consult for IV. L arm restricted. No appropriate site found for peripheral access due to depth and size of veins. Recommend considering central access for further IV needs. RN aware.

## 2024-03-31 NOTE — Plan of Care (Signed)

## 2024-03-31 NOTE — Progress Notes (Addendum)
 "                                                                                                                                                                                                                                                                                PROGRESS NOTE     Patient Demographics:    Deborah [name], is a 59 y.o. female, DOB - 06-01-1965, FMW:[id number]  Outpatient Primary MD for the patient is Pcp, No    LOS - 15  Admit date - 03/16/2024    Chief Complaint  Patient presents with   Loss of Consciousness       Brief Narrative (HPI from H&P)     59 yo female with past medical history significant for PAF, HLD, right BKA, ESRD on HD (TTS) who presented from SNF to [facility] after syncopal episode with +LOC. Patient hemodynamically unstable in ED despite fluid resuscitation with 3L crystalloid and was subsequently started on vasopressors. Labs notable for metabolic acidosis, sCr 7.04, BUN 23, iCa 1.00, troponin 105, lactic acic 9.0, given 1 amp HCO3. On exam patient with diffuse abdominal pain, exam somewhat limited as she has chronic pain managed daily with Dilaudid . Patient started empirically on Cefepime , Vanc, Flagyl ; CT A/P pending, and syncope workup in progress. PCCM consulted for ICU admission.   Of note, patient recently hospitalized 02/14/2024-02/25/2024 for sepsis secondary to left thigh abscess and underwent I&D with gen surg 02/16/2024.  She was diagnosed with sepsis and subsequently Klebsiella bacteremia she was placed on IV meropenem , due to low blood pressures and underlying ESRD she was seen by nephrology and underwent multiple sessions of CRRT in ICU, she was also placed on vancomycin  prophylactically to prevent reoccurrence of C. difficile.  She was somewhat stabilized and transferred to my care on 03/22/2024 on day 6 of her hospital stay, currently blood pressure in 80s, still has NG tube, minimally responsive with generalized pain including abdominal  pain.   Significant Hospital Events: Including procedures, antibiotic start and stop dates in addition to other pertinent events   1/9 ED s/p witnessed syncopal event w/ +LOC in shock, started on Levo/Vaso; Aline/CVC placed 1/10.  CT abdomen pelvis in the ER.  Nonacute.   1/10 CRRT, still on vaso, NE. Esclated to meropenem  for ESBL Klebsiella 1/11 echocardiogram EF 60%.  No acute changes 1/12: off levophed , on vasopressin  1/13: off vasopressin , steroids, and CRRT 1/14: transfer out of ICU, TRH to pick up 1/15 1/23: Transfused units PRBC for hemoglobin of 6.7 1/23: TDC catheter continued by IR    Subjective:   Diarrhea is improving, still present chest pain, no shortness of breath has low-grade temperature overnight     Assessment  & Plan :    Septic shock due to Klebsiella pneumoniae bacteremia - Source of bacteremia is unclear. - Remains with significant leukocytosis, intermittent fever. - ID input greatly appreciated, she will need line holiday, discussed with IR, nephrology team, plan to HD tomorrow and line removal after that and line holiday over the weekend. - Antibiotics management per ID.  Will continue with meropenem  - Patient is very difficult stick, unfortunately unable to do any surveillance blood cultures, most her labs are done during HD orders through her lysis catheter line. - CT abdomen pelvis negative - With significant rash, crusting, unclear allergy   -Plan for line holiday finished HD 1/23, and [facility] DC'd after that    ESRD on HD (TTS) - -management per renal, she was dialyzed yesterday, currently in line holiday,   Chronic hypotension -Profoundly hypotensive initially requiring pressors, currently on high-dose midodrine  20 mg p.o. 3 times daily, remains hypotensive, she remains on albumin  as well    Opioid-use disorder Hx substance abuse - as an OP following with [facility], minimize narcotics.  Chronic pain, Hx fibromyalgia, Acute on chronic  abdominal pain - continue supportive care counseled to minimize narcotic intake as blood pressure at baseline is very low.   Anemia of chronic disease   -drop in Hb may be dilutional. No evidence of DIC or bleeding, received units PRBC yesterday for hemoglobin of 6.7, hemoglobin stable this morning at 9.7.    History of Cdiff  -  cdiff negative,  on prophylactic dose PO vanc, will give Imodium    Syncope; suspect due to sepsis   - echo: EF 60-605%, mild LVH, small pericardial effusion posteriorly    Troponin elevation; likely demand d/t shock state, PAF -not on anticoagulation due to past history of GI bleed, troponin elevation due to demand mismatch from sepsis, trend is flat and in non-ACS pattern, echo shows preserved EF and no wall motion abnormality.  Chest pain-free no further workup.  She developed RVR with hypotension for which she was seen by cardiology and started on amiodarone  drip on 03/23/2024. -Heart rate is up again today, so we will give IV amiodarone  load   HLD   - PTA statin   Hx duodenal carcinoid tumor s/p resection (2022), Hx gastric ulcer & gastritis (2024) - Chronic gastroparesis  -con't H2-blocker; stable.   S/p I&D for left thigh abscess 02/16/24.   - Stable on CT scan, orthopedic input greatly appreciated   Wounds present on admission - left thigh, R hip, lower abdomen, buttocks, L heel. turns, WOC as needed Dr. Harden to evaluate left thigh postop site.  PVD s/p R BKA- stump is well healed   Sacral pressure injury - turns, unfortunately needs pressors, hard to keep that skin clear with frequent diarrhea   Possible vaginal yeast infection  -Clotrimazole  cream daily x7 days   Anxiety/Depression -Resume home buspar , risperidone , trazodone  when appropriate> waiting for mental status to normalize some   DM2; hypoglycemia from sepsis corrected with steroids - On sliding  scale, low-dose Semglee , CBGs low overnight requiring D5W,  Lab Results  Component Value  Date   HGBA1C 4.3 (L) 02/15/2024   CBG (last 3)  Recent Labs    03/31/24 0448 03/31/24 0803 03/31/24 1151  GLUCAP 119* 73 71    Difficult stick -Patient is extremely difficult stick, with multiple attempts by IV team for access and blood draws with no success, as well she is not a candidate for any central line currently with her bacteremia, and this limits her blood draws repeat blood cultures significantly, does increase her risk, but unfortunately there is no safe intervention at this point       Condition - Extremely Guarded  Family Communication  : None present  Code Status : Full code  Consults  : PCCM, nephrology, orthopedic, ID  PUD Prophylaxis :    Procedures  :           Disposition Plan  :    Status is: Inpatient   DVT Prophylaxis  :    Place and maintain sequential compression device Start: 03/20/24 1800  Lab Results  Component Value Date   PLT 324 03/30/2024    Diet :  Diet Order             Diet renal with fluid restriction Fluid restriction: 1200 mL Fluid; Room service appropriate? Yes; Fluid consistency: Thin  Diet effective now                    Inpatient Medications  Scheduled Meds:  sodium chloride    Intravenous Once   sodium chloride    Intravenous Once   sodium chloride    Intravenous Once   amiodarone   400 mg Oral BID   Followed by   NOREEN ON 04/01/2024] amiodarone   200 mg Oral Daily   ascorbic acid   500 mg Oral BID   atorvastatin   10 mg Oral Daily   busPIRone   5 mg Oral TID   collagenase    Topical Daily   darbepoetin (ARANESP ) injection - DIALYSIS  150 mcg Subcutaneous Q Tue-1800   famotidine   20 mg Oral QHS   feeding supplement  1 Container Oral Q24H   fluconazole   200 mg Oral Weekly   heparin  sodium (porcine)  1.9 mL Intracatheter Once   insulin  aspart  0-5 Units Subcutaneous QHS   insulin  aspart  0-9 Units Subcutaneous TID WC   midodrine   20 mg Oral Q8H   multivitamin  1 tablet Oral QHS   mouth rinse  15 mL  Mouth Rinse 4 times per day   risperiDONE   1 mg Oral QHS   sodium chloride  flush  10-40 mL Intracatheter Q12H   vancomycin   125 mg Per Tube Daily   Or   vancomycin   125 mg Oral Daily   Continuous Infusions:  albumin  human 25 g (03/31/24 0442)   ertapenem  500 mg (03/31/24 0122)   PRN Meds:.acetaminophen , hydrOXYzine , mouth rinse, polyethylene glycol, senna    Objective:   Vitals:   03/31/24 0700 03/31/24 0705 03/31/24 0800 03/31/24 1156  BP: (!) 100/52   96/61  Pulse: 86     Resp: (!) 23 19    Temp:   99.3 F (37.4 C) 99.8 F (37.7 C)  TempSrc:   Axillary Axillary  SpO2: 98%     Weight:      Height:        Wt Readings from Last 3 Encounters:  03/30/24 (S) 104.2 kg  02/25/24 97.2 kg  06/20/23 114.5 kg  Intake/Output Summary (Last 24 hours) at 03/31/2024 1233 Last data filed at 03/30/2024 1700 Gross per 24 hour  Intake 10 ml  Output 1.8 ml  Net 8.2 ml       Physical Exam  Frail, chronically appearing, extremely deconditioned, drowsy today but wakes up and answer questions appropriately  Good air entry  Regular rate and rhythm  Abdomen soft  Right BKA  Skin with diffuse desquamating rash      RN pressure injury documentation: Wound 02/19/24 0200 Pressure Injury Foot Left;Lateral Unstageable - Full thickness tissue loss in which the base of the injury is covered by slough (yellow, tan, gray, green or brown) and/or eschar (tan, brown or black) in the wound bed. (Active)     Wound 03/17/24 0045 Pressure Injury Thigh Left;Posterior Stage 2 -  Partial thickness loss of dermis presenting as a shallow open injury with a red, pink wound bed without slough. (Active)     Wound 03/17/24 0045 Pressure Injury Hip Lateral;Right Stage 2 -  Partial thickness loss of dermis presenting as a shallow open injury with a red, pink wound bed without slough. (Active)     Wound 03/17/24 0045 Pressure Injury Abdomen Lower;Left Stage 2 -  Partial thickness loss of dermis  presenting as a shallow open injury with a red, pink wound bed without slough. (Active)     Wound 03/17/24 1119 Pressure Injury Buttocks Right;Left;Medial Stage 3 -  Full thickness tissue loss. Subcutaneous fat may be visible but bone, tendon or muscle are NOT exposed. (Active)      Data Review:    Recent Labs  Lab 03/27/24 1328 03/29/24 1230 03/30/24 0846 03/30/24 1454  WBC 23.2* 19.4* 15.1* 16.1*  HGB 8.1* 8.1* 6.7* 9.7*  HCT 24.9* 25.7* 20.4* 28.3*  PLT 370 390 337 324  MCV 97.6 100.8* 98.6 92.5  MCH 31.8 31.8 32.4 31.7  MCHC 32.5 31.5 32.8 34.3  RDW 19.8* 19.8* 19.2* 19.1*  LYMPHSABS 2.0  --  2.0  --   MONOABS 1.7*  --  0.7  --   EOSABS 0.2  --  0.2  --   BASOSABS 0.1  --  0.1  --     Recent Labs  Lab 03/24/24 1618 03/27/24 1327 03/29/24 1230 03/30/24 0846  NA 135 139 138 140  K 3.6 3.7 3.8 3.5  CL 101 105 102 106  CO2 24 24 24 23   ANIONGAP 10 10 12 12   GLUCOSE 139* 162* 163* 85  BUN 19 30* 22* 25*  CREATININE 2.33* 4.33* 3.84* 4.31*  ALBUMIN  1.8* 1.7* 1.9* 2.6*  MG 1.9 2.1 1.9 2.1  PHOS 2.9 4.5 4.3 4.3  CALCIUM  7.1* 7.1* 7.4* 8.0*      Recent Labs  Lab 03/24/24 1618 03/27/24 1327 03/29/24 1230 03/30/24 0846  MG 1.9 2.1 1.9 2.1  CALCIUM  7.1* 7.1* 7.4* 8.0*    --------------------------------------------------------------------------------------------------------------- Lab Results  Component Value Date   CHOL 104 07/22/2019   HDL 30 (L) 07/22/2019   LDLCALC 37 07/22/2019   TRIG 185 (H) 07/22/2019   CHOLHDL 3.5 07/22/2019    Lab Results  Component Value Date   HGBA1C 4.3 (L) 02/15/2024   No results for input(s): TSH, T4TOTAL, FREET4, T3FREE, THYROIDAB in the last 72 hours. No results for input(s): VITAMINB12, FOLATE, FERRITIN, TIBC, IRON , RETICCTPCT in the last 72 hours. ------------------------------------------------------------------------------------------------------------------ Cardiac Enzymes No results for  input(s): CKMB, TROPONINI, MYOGLOBIN in the last 168 hours.  Invalid input(s): CK  Radiology Report IR Removal Tun Cv Cath  W/O FL Result Date: 03/30/2024 INDICATION: History of ESRD requiring hemodialysis. Previous tunneled HD catheter placed 06/24/2022. Currently admitted with bacteremia. Request for tunneled HD catheter removal for line holiday. EXAM: REMOVAL TUNNELED CENTRAL VENOUS CATHETER MEDICATIONS: 3 mL 1% lidocaine  ANESTHESIA/SEDATION: None FLUOROSCOPY: None COMPLICATIONS: None immediate. PROCEDURE: Informed written consent was obtained from the patient after a thorough discussion of the procedural risks, benefits and alternatives. All questions were addressed. Maximal Sterile Barrier Technique was utilized including caps, mask, sterile gowns, sterile gloves, sterile drape, hand hygiene and skin antiseptic. A timeout was performed prior to the initiation of the procedure. The patient's right chest and catheter was prepped and draped in a normal sterile fashion. Heparin  was removed from both ports of catheter. 1% lidocaine  was used for local anesthesia. Using gentle blunt dissection the cuff of the catheter was exposed and the catheter was removed in it's entirety. Pressure was held till hemostasis was obtained. A sterile dressing was applied. The patient tolerated the procedure well with no immediate complications. IMPRESSION: Successful catheter removal as described above. Performed by: Wyatt Pommier, PA-C Electronically Signed   By: CHRISTELLA.  Shick M.D.   On: 03/30/2024 16:04      Signature  -   Brayton Lye M.D on 03/31/2024 at 12:33 PM   -  To page go to [email or web address]   "

## 2024-04-01 ENCOUNTER — Inpatient Hospital Stay (HOSPITAL_COMMUNITY): Admitting: Certified Registered Nurse Anesthetist

## 2024-04-01 ENCOUNTER — Inpatient Hospital Stay (HOSPITAL_COMMUNITY)

## 2024-04-01 ENCOUNTER — Encounter (HOSPITAL_COMMUNITY): Payer: Self-pay | Admitting: Internal Medicine

## 2024-04-01 ENCOUNTER — Encounter (HOSPITAL_COMMUNITY): Admission: EM | Payer: Self-pay | Source: Home / Self Care | Attending: Internal Medicine

## 2024-04-01 DIAGNOSIS — I4891 Unspecified atrial fibrillation: Secondary | ICD-10-CM | POA: Diagnosis not present

## 2024-04-01 DIAGNOSIS — R7881 Bacteremia: Secondary | ICD-10-CM | POA: Diagnosis not present

## 2024-04-01 DIAGNOSIS — Z1612 Extended spectrum beta lactamase (ESBL) resistance: Secondary | ICD-10-CM | POA: Diagnosis not present

## 2024-04-01 DIAGNOSIS — N186 End stage renal disease: Secondary | ICD-10-CM | POA: Diagnosis not present

## 2024-04-01 DIAGNOSIS — B961 Klebsiella pneumoniae [K. pneumoniae] as the cause of diseases classified elsewhere: Secondary | ICD-10-CM | POA: Diagnosis not present

## 2024-04-01 DIAGNOSIS — E119 Type 2 diabetes mellitus without complications: Secondary | ICD-10-CM | POA: Diagnosis not present

## 2024-04-01 DIAGNOSIS — I110 Hypertensive heart disease with heart failure: Secondary | ICD-10-CM | POA: Diagnosis not present

## 2024-04-01 DIAGNOSIS — I5033 Acute on chronic diastolic (congestive) heart failure: Secondary | ICD-10-CM | POA: Diagnosis not present

## 2024-04-01 DIAGNOSIS — A419 Sepsis, unspecified organism: Secondary | ICD-10-CM | POA: Diagnosis not present

## 2024-04-01 DIAGNOSIS — R6521 Severe sepsis with septic shock: Secondary | ICD-10-CM | POA: Diagnosis not present

## 2024-04-01 DIAGNOSIS — L139 Bullous disorder, unspecified: Secondary | ICD-10-CM | POA: Diagnosis not present

## 2024-04-01 DIAGNOSIS — Z992 Dependence on renal dialysis: Secondary | ICD-10-CM | POA: Diagnosis not present

## 2024-04-01 LAB — CBC WITH DIFFERENTIAL/PLATELET
Abs Immature Granulocytes: 0.15 10*3/uL — ABNORMAL HIGH (ref 0.00–0.07)
Basophils Absolute: 0.1 10*3/uL (ref 0.0–0.1)
Basophils Relative: 0 %
Eosinophils Absolute: 0 10*3/uL (ref 0.0–0.5)
Eosinophils Relative: 0 %
HCT: 27 % — ABNORMAL LOW (ref 36.0–46.0)
Hemoglobin: 8.9 g/dL — ABNORMAL LOW (ref 12.0–15.0)
Immature Granulocytes: 1 %
Lymphocytes Relative: 4 %
Lymphs Abs: 0.8 10*3/uL (ref 0.7–4.0)
MCH: 31.7 pg (ref 26.0–34.0)
MCHC: 33 g/dL (ref 30.0–36.0)
MCV: 96.1 fL (ref 80.0–100.0)
Monocytes Absolute: 0.3 10*3/uL (ref 0.1–1.0)
Monocytes Relative: 2 %
Neutro Abs: 19.4 10*3/uL — ABNORMAL HIGH (ref 1.7–7.7)
Neutrophils Relative %: 93 %
Platelets: 259 10*3/uL (ref 150–400)
RBC: 2.81 MIL/uL — ABNORMAL LOW (ref 3.87–5.11)
RDW: 18.3 % — ABNORMAL HIGH (ref 11.5–15.5)
WBC: 20.7 10*3/uL — ABNORMAL HIGH (ref 4.0–10.5)
nRBC: 0 % (ref 0.0–0.2)

## 2024-04-01 LAB — COMPREHENSIVE METABOLIC PANEL WITH GFR
ALT: 6 U/L (ref 0–44)
AST: 14 U/L — ABNORMAL LOW (ref 15–41)
Albumin: 3.4 g/dL — ABNORMAL LOW (ref 3.5–5.0)
Alkaline Phosphatase: 61 U/L (ref 38–126)
Anion gap: 14 (ref 5–15)
BUN: 23 mg/dL — ABNORMAL HIGH (ref 6–20)
CO2: 24 mmol/L (ref 22–32)
Calcium: 7.9 mg/dL — ABNORMAL LOW (ref 8.9–10.3)
Chloride: 102 mmol/L (ref 98–111)
Creatinine, Ser: 4.09 mg/dL — ABNORMAL HIGH (ref 0.44–1.00)
GFR, Estimated: 12 mL/min — ABNORMAL LOW
Glucose, Bld: 117 mg/dL — ABNORMAL HIGH (ref 70–99)
Potassium: 3.7 mmol/L (ref 3.5–5.1)
Sodium: 140 mmol/L (ref 135–145)
Total Bilirubin: 0.9 mg/dL (ref 0.0–1.2)
Total Protein: 4.9 g/dL — ABNORMAL LOW (ref 6.5–8.1)

## 2024-04-01 LAB — MAGNESIUM: Magnesium: 1.8 mg/dL (ref 1.7–2.4)

## 2024-04-01 LAB — POCT I-STAT 7, (LYTES, BLD GAS, ICA,H+H)
Acid-Base Excess: 2 mmol/L (ref 0.0–2.0)
Bicarbonate: 25.4 mmol/L (ref 20.0–28.0)
Calcium, Ion: 1.08 mmol/L — ABNORMAL LOW (ref 1.15–1.40)
HCT: 30 % — ABNORMAL LOW (ref 36.0–46.0)
Hemoglobin: 10.2 g/dL — ABNORMAL LOW (ref 12.0–15.0)
O2 Saturation: 100 %
Potassium: 3.4 mmol/L — ABNORMAL LOW (ref 3.5–5.1)
Sodium: 140 mmol/L (ref 135–145)
TCO2: 26 mmol/L (ref 22–32)
pCO2 arterial: 33.6 mmHg (ref 32–48)
pH, Arterial: 7.487 — ABNORMAL HIGH (ref 7.35–7.45)
pO2, Arterial: 463 mmHg — ABNORMAL HIGH (ref 83–108)

## 2024-04-01 LAB — GLUCOSE, CAPILLARY
Glucose-Capillary: 115 mg/dL — ABNORMAL HIGH (ref 70–99)
Glucose-Capillary: 112 mg/dL — ABNORMAL HIGH (ref 70–99)
Glucose-Capillary: 115 mg/dL — ABNORMAL HIGH (ref 70–99)

## 2024-04-01 LAB — AMYLASE: Amylase: 20 U/L — ABNORMAL LOW (ref 28–100)

## 2024-04-01 LAB — LACTIC ACID, PLASMA: Lactic Acid, Venous: 0.7 mmol/L (ref 0.5–1.9)

## 2024-04-01 LAB — LIPASE, BLOOD: Lipase: <10 U/L — ABNORMAL LOW (ref 11–51)

## 2024-04-01 LAB — PHOSPHORUS: Phosphorus: 3.8 mg/dL (ref 2.5–4.6)

## 2024-04-01 MED ORDER — FENTANYL CITRATE (PF) 250 MCG/5ML IJ SOLN
INTRAMUSCULAR | Status: AC
  Filled 2024-04-01: qty 5

## 2024-04-01 MED ORDER — FENTANYL CITRATE (PF) 100 MCG/2ML IJ SOLN
INTRAMUSCULAR | Status: AC
  Filled 2024-04-01: qty 2

## 2024-04-01 MED ORDER — VANCOMYCIN HCL 1500 MG/300ML IV SOLN
1500.0000 mg | Freq: Once | INTRAVENOUS | Status: DC
  Administered 2024-04-01: 1500 mg via INTRAVENOUS
  Filled 2024-04-01: qty 300

## 2024-04-01 MED ORDER — ORAL CARE MOUTH RINSE
15.0000 mL | Freq: Once | OROMUCOSAL | Status: AC
  Administered 2024-04-01: 15 mL via OROMUCOSAL

## 2024-04-01 MED ORDER — PROPOFOL 10 MG/ML IV BOLUS
INTRAVENOUS | Status: DC | PRN
  Administered 2024-04-01: 120 mg via INTRAVENOUS

## 2024-04-01 MED ORDER — FENTANYL CITRATE (PF) 100 MCG/2ML IJ SOLN
25.0000 ug | INTRAMUSCULAR | Status: DC | PRN
  Administered 2024-04-01 (×4): 50 ug via INTRAVENOUS

## 2024-04-01 MED ORDER — DEXAMETHASONE SOD PHOSPHATE PF 10 MG/ML IJ SOLN
INTRAMUSCULAR | Status: DC | PRN
  Administered 2024-04-01: 4 mg via INTRAVENOUS

## 2024-04-01 MED ORDER — OXYCODONE HCL 5 MG/5ML PO SOLN: 5.0000 mg | Freq: Once | ORAL | Status: DC | PRN

## 2024-04-01 MED ORDER — ACETAMINOPHEN 10 MG/ML IV SOLN: 1000.0000 mg | Freq: Once | INTRAVENOUS | Status: DC | PRN

## 2024-04-01 MED ORDER — ONDANSETRON HCL 4 MG/2ML IJ SOLN
INTRAMUSCULAR | Status: AC
  Filled 2024-04-01: qty 2

## 2024-04-01 MED ORDER — VANCOMYCIN HCL IN DEXTROSE 1-5 GM/200ML-% IV SOLN
INTRAVENOUS | Status: AC
  Filled 2024-04-01: qty 400

## 2024-04-01 MED ORDER — ONDANSETRON HCL 4 MG/2ML IJ SOLN
INTRAMUSCULAR | Status: DC | PRN
  Administered 2024-04-01: 4 mg via INTRAVENOUS

## 2024-04-01 MED ORDER — SODIUM CHLORIDE (PF) 0.9 % IJ SOLN
INTRAMUSCULAR | Status: AC
  Filled 2024-04-01: qty 10

## 2024-04-01 MED ORDER — 0.9 % SODIUM CHLORIDE (POUR BTL) OPTIME
TOPICAL | Status: DC | PRN
  Administered 2024-04-01: 1000 mL

## 2024-04-01 MED ORDER — PROPOFOL 10 MG/ML IV BOLUS
INTRAVENOUS | Status: AC
  Filled 2024-04-01: qty 20

## 2024-04-01 MED ORDER — HYDROMORPHONE HCL 1 MG/ML IJ SOLN
0.5000 mg | Freq: Once | INTRAMUSCULAR | Status: AC
  Administered 2024-04-01: 0.5 mg via INTRAVENOUS
  Filled 2024-04-01: qty 1

## 2024-04-01 MED ORDER — LINEZOLID 600 MG/300ML IV SOLN
600.0000 mg | Freq: Two times a day (BID) | INTRAVENOUS | Status: DC
  Administered 2024-04-01 – 2024-04-04 (×6): 600 mg via INTRAVENOUS
  Filled 2024-04-01 (×8): qty 300

## 2024-04-01 MED ORDER — AMISULPRIDE (ANTIEMETIC) 5 MG/2ML IV SOLN: 10.0000 mg | Freq: Once | INTRAVENOUS | Status: DC | PRN

## 2024-04-01 MED ORDER — SUGAMMADEX SODIUM 200 MG/2ML IV SOLN
INTRAVENOUS | Status: DC | PRN
  Administered 2024-04-01: 300 mg via INTRAVENOUS

## 2024-04-01 MED ORDER — CHLORHEXIDINE GLUCONATE 0.12 % MT SOLN
OROMUCOSAL | Status: AC
  Filled 2024-04-01: qty 15

## 2024-04-01 MED ORDER — CHLORHEXIDINE GLUCONATE 0.12 % MT SOLN: 15.0000 mL | Freq: Once | OROMUCOSAL | Status: AC

## 2024-04-01 MED ORDER — ONDANSETRON HCL 4 MG/2ML IJ SOLN: 4.0000 mg | Freq: Once | INTRAMUSCULAR | Status: DC | PRN

## 2024-04-01 MED ORDER — ALBUMIN HUMAN 5 % IV SOLN: INTRAVENOUS | Status: DC | PRN

## 2024-04-01 MED ORDER — PHENYLEPHRINE 80 MCG/ML (10ML) SYRINGE FOR IV PUSH (FOR BLOOD PRESSURE SUPPORT)
PREFILLED_SYRINGE | INTRAVENOUS | Status: DC | PRN
  Administered 2024-04-01: 160 ug via INTRAVENOUS
  Administered 2024-04-01 (×2): 80 ug via INTRAVENOUS

## 2024-04-01 MED ORDER — ROCURONIUM BROMIDE 10 MG/ML (PF) SYRINGE
PREFILLED_SYRINGE | INTRAVENOUS | Status: DC | PRN
  Administered 2024-04-01: 100 mg via INTRAVENOUS

## 2024-04-01 MED ORDER — PHENYLEPHRINE HCL-NACL 20-0.9 MG/250ML-% IV SOLN
INTRAVENOUS | Status: DC | PRN
  Administered 2024-04-01: 30 ug/min via INTRAVENOUS

## 2024-04-01 MED ORDER — OXYCODONE HCL 5 MG PO TABS: 5.0000 mg | ORAL_TABLET | Freq: Once | ORAL | Status: DC | PRN

## 2024-04-01 MED ORDER — ROCURONIUM BROMIDE 10 MG/ML (PF) SYRINGE
PREFILLED_SYRINGE | INTRAVENOUS | Status: AC
  Filled 2024-04-01: qty 10

## 2024-04-01 MED ORDER — LIDOCAINE 2% (20 MG/ML) 5 ML SYRINGE
INTRAMUSCULAR | Status: AC
  Filled 2024-04-01: qty 5

## 2024-04-01 MED ORDER — LIDOCAINE 2% (20 MG/ML) 5 ML SYRINGE
INTRAMUSCULAR | Status: DC | PRN
  Administered 2024-04-01: 100 mg via INTRAVENOUS

## 2024-04-01 MED ORDER — SODIUM CHLORIDE 0.9 % IV SOLN: INTRAVENOUS | Status: DC | PRN

## 2024-04-01 MED ORDER — DEXAMETHASONE SOD PHOSPHATE PF 10 MG/ML IJ SOLN
INTRAMUSCULAR | Status: AC
  Filled 2024-04-01: qty 1

## 2024-04-01 MED ORDER — FENTANYL CITRATE (PF) 250 MCG/5ML IJ SOLN
INTRAMUSCULAR | Status: DC | PRN
  Administered 2024-04-01 (×3): 50 ug via INTRAVENOUS

## 2024-04-01 NOTE — Progress Notes (Signed)
 Attempted to perform peri-care. Pt is refusing; states it's painful to turn.

## 2024-04-01 NOTE — Anesthesia Procedure Notes (Signed)
 Arterial Line Insertion Start/End1/25/2026 1:33 PM Performed by: Lamar Lucie DASEN, CRNA  Patient location: Pre-op . Preanesthetic checklist: patient identified, IV checked, site marked, risks and benefits discussed, surgical consent, monitors and equipment checked, pre-op  evaluation, timeout performed and anesthesia consent Lidocaine  1% used for infiltration Right, radial was placed Catheter size: 20 G Hand hygiene performed  and maximum sterile barriers used   Attempts: 1 Procedure performed using ultrasound to evaluate access site. Following insertion, dressing applied and Biopatch. Post procedure assessment: normal and unchanged  Patient tolerated the procedure well with no immediate complications.

## 2024-04-01 NOTE — Progress Notes (Addendum)
 Pharmacy Antibiotic Note  Deborah [name] is a 59 y.o. female admitted on 03/16/2024 with necrotizing fascitis of leg/groin area and Klebsiella pneumoniae bacteremia being treated with ertapenem . Pharmacy has been consulted for vancomycin  dosing.  Patient is on HD with next session either tomorrow or Tuesday. Will give loading dose now of 15 mg/kg, then follow up HD schedule for further dosing.   Plan: -Vancomycin  1500 mg IV x1 -Monitor WBC, clinical improvement, renal function -F/u HD schedule for further dosing ------- Addendum: Patient febrile with evolving erythema pain, fluid collections going to OR for urgent debridement of necrotizing fasciitis of bilateral groin area.  -Discontinue vancomycin  -Initiate linezolid  600 mg IV BID -F/u clinical status post debridement, cultures  Height: 5' 6 (167.6 cm) Weight: 96.5 kg (212 lb 11.9 oz) IBW/kg (Calculated) : 59.3  Temp (24hrs), Avg:100 F (37.8 C), Min:99.1 F (37.3 C), Max:101.4 F (38.6 C)  Recent Labs  Lab 03/27/24 1327 03/27/24 1328 03/29/24 1230 03/30/24 0846 03/30/24 1454  WBC  --  23.2* 19.4* 15.1* 16.1*  CREATININE 4.33*  --  3.84* 4.31*  --     Estimated Creatinine Clearance: 16.3 mL/min (A) (by C-G formula based on SCr of 4.31 mg/dL (H)).    Allergies[1]  Antimicrobials this admission: CFP/MTZ 1/9 x1 Vanc 1/9 >> 1/10, 1/11 >> 1/12 Merrem  1/10 > 1/23 PO Vanc ppx 1/10 > 1/24 Fluconazole  1/21 >> Ertapenem  1/24 >> Linezolid  1/25>>  Microbiology results: 1/9 BCx: 4/4 Klebsiella pneumoniae, Bacillus spp 1/10 MRSA PCR: not detected  Thank you for allowing pharmacy to be a part of this patients care.  Izetta Carl, PharmD PGY1 Pharmacy Resident      [1]  Allergies Allergen Reactions   Dorethia Blakes ] Other (See Comments)    Hx of stomach ulcer   Bentyl  [Dicyclomine ] Other (See Comments)    Chest pain   Gabapentin  Hives and [name]   Isovue  [Iopamidol ] Anaphylaxis, Shortness Of  Breath and Other (See Comments)    11/28/17 Patient had seizure like activity and then 1 min code after 100 cc of isovue  300. Possible contrast allergy  vs vasovagal episode  Cardiac Arrest   Nsaids Anaphylaxis and Other (See Comments)    Hx of stomach ulcers   Penicillins Itching, Palpitations and Other (See Comments)    Flushing (Red Skin) Laryngeal Edema   Reglan  [Metoclopramide ] Other (See Comments)    Tardive dyskinesia    Valium  [Diazepam ] [name]   Zestril [Lisinopril] Anaphylaxis and Swelling    Tongue and mouth swelling Laryngeal Edema   Tolectin [Tolmetin] Nausea And Vomiting, Nausea Only and Other (See Comments)    Irritates stomach ulcer   Aspartame And Phenylalanine Hives   Flexeril  [Cyclobenzaprine ] Palpitations   Hibiclens  [Chlorhexidine  Gluconate] Dermatitis   Oxycontin  [Oxycodone ] Palpitations   Rifamycins Palpitations   Tylenol  [Acetaminophen ] Nausea And Vomiting, Nausea Only and Other (See Comments)    Irritates stomach ulcer Abdominal pain   Ultram  [Tramadol ] Nausea And Vomiting and Palpitations

## 2024-04-01 NOTE — Progress Notes (Addendum)
 " [facility] Progress Note   Subjective:   Seen in room with RN and hospitalist. Ongoing fevers noted. Dramatic worsening groin/BLE wounds, very dusky and weeping, ?necrotizing fascitis. Getting Vanc and has been ordered for CT abd/pelvis and B femur areas.  Objective Vitals:   04/01/24 0415 04/01/24 0500 04/01/24 0700 04/01/24 0739  BP: 120/88 (!) 100/41  (!) 97/57  Pulse: (!) 107     Resp: 16  13   Temp: (!) 101.4 F (38.6 C) 99.8 F (37.7 C)  99.5 F (37.5 C)  TempSrc: Axillary   Axillary  SpO2:    92%  Weight:  96.5 kg    Height:       Physical Exam General: Chronically ill appearing woman, NAD. [place] O2 in place Heart: RRR Lungs: CTA anteriorly Abdomen: soft GU: Progressive dusky/erythematous rash involving lower pannus and BLE tracking down with weeping, appears to be necrosing Extremities: + Edema, dusky rash with weeping involving L leg + R stump. Similar appearance in R axilla Dialysis Access: none (TDC removed, clean bandage)  Additional Objective Labs: Basic Metabolic Panel: Recent Labs  Lab 03/27/24 1327 03/29/24 1230 03/30/24 0846  NA 139 138 140  K 3.7 3.8 3.5  CL 105 102 106  CO2 24 24 23   GLUCOSE 162* 163* 85  BUN 30* 22* 25*  CREATININE 4.33* 3.84* 4.31*  CALCIUM  7.1* 7.4* 8.0*  PHOS 4.5 4.3 4.3   Liver Function Tests: Recent Labs  Lab 03/27/24 1327 03/29/24 1230 03/30/24 0846  ALBUMIN  1.7* 1.9* 2.6*   CBC: Recent Labs  Lab 03/27/24 1328 03/29/24 1230 03/30/24 0846 03/30/24 1454  WBC 23.2* 19.4* 15.1* 16.1*  NEUTROABS 19.1*  --  11.9*  --   HGB 8.1* 8.1* 6.7* 9.7*  HCT 24.9* 25.7* 20.4* 28.3*  MCV 97.6 100.8* 98.6 92.5  PLT 370 390 337 324   Blood Culture    Component Value Date/Time   SDES BLOOD RIGHT ANTECUBITAL 03/16/2024 1949   SPECREQUEST  03/16/2024 1949    BOTTLES DRAWN AEROBIC AND ANAEROBIC Blood Culture results may not be optimal due to an inadequate volume of blood received in culture bottles   CULT (A)  03/16/2024 1949    KLEBSIELLA PNEUMONIAE Confirmed Extended Spectrum Beta-Lactamase Producer (ESBL).  In bloodstream infections from ESBL organisms, carbapenems are preferred over piperacillin /tazobactam. They are shown to have a lower risk of mortality.    REPTSTATUS 03/19/2024 FINAL 03/16/2024 1949   Studies/Results: DG Chest Port 1 View Result Date: 03/31/2024 EXAM: 1 VIEW(S) XRAY OF THE CHEST 03/31/2024 07:31:00 AM COMPARISON: 03/25/2024 CLINICAL HISTORY: Fever. FINDINGS: LINES, TUBES AND DEVICES: Right IJ central venous catheter removed. LUNGS AND PLEURA: Low lung volumes. Vague left perihilar airspace opacities. No pleural effusion. No pneumothorax. HEART AND MEDIASTINUM: No acute abnormality of the cardiac and mediastinal silhouettes. BONES AND SOFT TISSUES: No acute osseous abnormality. IMPRESSION: 1. Vague left perihilar airspace opacities, which may reflect atelectasis related to low lung volumes versus developing infection. Electronically signed by: Dayne Hassell MD 03/31/2024 03:18 PM EST RP Workstation: [id number]   IR Removal Tun Cv Cath W/O FL Result Date: 03/30/2024 INDICATION: History of ESRD requiring hemodialysis. Previous tunneled HD catheter placed 06/24/2022. Currently admitted with bacteremia. Request for tunneled HD catheter removal for line holiday. EXAM: REMOVAL TUNNELED CENTRAL VENOUS CATHETER MEDICATIONS: 3 mL 1% lidocaine  ANESTHESIA/SEDATION: None FLUOROSCOPY: None COMPLICATIONS: None immediate. PROCEDURE: Informed written consent was obtained from the patient after a thorough discussion of the procedural risks, benefits and alternatives. All questions were  addressed. Maximal Sterile Barrier Technique was utilized including caps, mask, sterile gowns, sterile gloves, sterile drape, hand hygiene and skin antiseptic. A timeout was performed prior to the initiation of the procedure. The patient's right chest and catheter was prepped and draped in a normal sterile fashion. Heparin   was removed from both ports of catheter. 1% lidocaine  was used for local anesthesia. Using gentle blunt dissection the cuff of the catheter was exposed and the catheter was removed in it's entirety. Pressure was held till hemostasis was obtained. A sterile dressing was applied. The patient tolerated the procedure well with no immediate complications. IMPRESSION: Successful catheter removal as described above. Performed by: Wyatt Pommier, PA-C Electronically Signed   By: CHRISTELLA.  Shick M.D.   On: 03/30/2024 16:04   Medications:  albumin  human 25 g (04/01/24 0434)   ertapenem  500 mg (03/31/24 2250)   vancomycin       sodium chloride    Intravenous Once   sodium chloride    Intravenous Once   sodium chloride    Intravenous Once   amiodarone   200 mg Oral Daily   ascorbic acid   500 mg Oral BID   atorvastatin   10 mg Oral Daily   busPIRone   5 mg Oral TID   collagenase    Topical Daily   darbepoetin (ARANESP ) injection - DIALYSIS  150 mcg Subcutaneous Q Tue-1800   famotidine   20 mg Oral QHS   feeding supplement  1 Container Oral Q24H   fluconazole   200 mg Oral Weekly   heparin  sodium (porcine)  1.9 mL Intracatheter Once   insulin  aspart  0-5 Units Subcutaneous QHS   insulin  aspart  0-9 Units Subcutaneous TID WC   midodrine   20 mg Oral Q8H   multivitamin  1 tablet Oral QHS   mouth rinse  15 mL Mouth Rinse 4 times per day   risperiDONE   1 mg Oral QHS   sodium chloride  flush  10-40 mL Intracatheter Q12H    Dialysis Orders ESRD - Usual TTS schedule - s/p CRRT 1/10-1/13 given significant shock - Dialyzed Friday 1/23 prior to [facility] removal. Anticipate next HD to be Mon or Tues (after new line placed) - Had originally ordered tunneled cath placement tomorrow, changed to temp cath placement   PAfib - cardiology following, back on PO amiodarone    Hypotension/volume - Septic on admit,  S/p pressors - Now on high dose midodrine  20mg  TID + extra prn with HD. - Still up volume wise, UF as tolerated with  HD   Sepsis/ESBL Klebsiella bacteremia - On ertapenem , has been ordered for outpatient (clinic will notify hospital when it arrives) - Blood Cx 03/16/24 positive - issues getting repeat cultures as she has horrible peripheral veins - TDC is out  Progressive dusky rash to pelvic/groin area, lower abd pannus and B legs - weeping - CT pending - Vanc added - Very concerning for necrotizing fasciitis   Anemia of ESRD - Hgb 9.7, much better - S/p 2U PRBCs on 1/23 - Continue weekly Aranesp    Diarrhea - On empiric PO Vanc given Hx C.diff - Toxin negative on 1/10   T2DM - Insulin  per primary   Dispo/GOC - Palliative following.  - Plan is SNF       Izetta Boehringer, PA-C 04/01/2024, 9:59 AM  [facility]  ; "

## 2024-04-01 NOTE — Progress Notes (Signed)
 "                                                                                                                                                                                                                                                                                PROGRESS NOTE     Patient Demographics:    Deborah [name], is a 59 y.o. female, DOB - 1965/11/17, FMW:[id number]  Outpatient Primary MD for the patient is Pcp, No    LOS - 16  Admit date - 03/16/2024    Chief Complaint  Patient presents with   Loss of Consciousness       Brief Narrative (HPI from H&P)     59 yo female with past medical history significant for PAF, HLD, right BKA, ESRD on HD (TTS) who presented from SNF to [facility] after syncopal episode with +LOC. Patient hemodynamically unstable in ED despite fluid resuscitation with 3L crystalloid and was subsequently started on vasopressors. Labs notable for metabolic acidosis, sCr 7.04, BUN 23, iCa 1.00, troponin 105, lactic acic 9.0, given 1 amp HCO3. On exam patient with diffuse abdominal pain, exam somewhat limited as she has chronic pain managed daily with Dilaudid . Patient started empirically on Cefepime , Vanc, Flagyl ; CT A/P pending, and syncope workup in progress. PCCM consulted for ICU admission.   Of note, patient recently hospitalized 02/14/2024-02/25/2024 for sepsis secondary to left thigh abscess and underwent I&D with gen surg 02/16/2024.  She was diagnosed with sepsis and subsequently Klebsiella bacteremia she was placed on IV meropenem , due to low blood pressures and underlying ESRD she was seen by nephrology and underwent multiple sessions of CRRT in ICU, she was also placed on vancomycin  prophylactically to prevent reoccurrence of C. difficile.  She was somewhat stabilized and transferred to progressive care 03/22/2024, low blood pressure remains low, requiring continuous albumin  dosing and high dose midodrine , but overall patient has been refusing care, frequent  cleaning, medications, she is minimally sleepy and has been requesting frequent pain medications, refusing meds, PT, OT and regular care , as above 1/25 she remains febrile, with new progressive bilateral thigh erythema and swelling which is concerning for necrotizing fasciitis, please see discussion below.  Significant Hospital Events: Including procedures, antibiotic start and stop dates in addition to other pertinent events   1/9 ED s/p witnessed syncopal event w/ +LOC in shock, started on Levo/Vaso; Aline/CVC placed 1/10.  CT abdomen pelvis in the ER.  Nonacute.   1/10 CRRT, still on vaso, NE. Esclated to meropenem  for ESBL Klebsiella 1/11 echocardiogram EF 60%.  No acute changes 1/12: off levophed , on vasopressin  1/13: off vasopressin , steroids, and CRRT 1/14: transfer out of ICU, TRH to pick up 1/15 1/23: Transfused units PRBC for hemoglobin of 6.7 1/23: TDC catheter continued by IR 1/25: Remains febrile, with rapidly progressive bilateral groin/thigh/lower abdomen area erythema swelling concerning for necrotizing fasciitis    Subjective:   She remains febrile, morning with evolving erythema pain in bilateral groin area concerning for necrotizing fasciitis.    Assessment  & Plan :    Septic shock due to Klebsiella pneumoniae bacteremia - Source of bacteremia is unclear. - Remains with significant leukocytosis, intermittent fever. - Antibiotics management per ID.  He is on meropenem , - Patient is very difficult stick, unfortunately unable to do any surveillance blood cultures, most her labs are done during HD orders through her HD, and most recently done from her catheter before discharge, otherwise multiple attempts by multiple staff cannot get any labs. - With significant rash, crusting, unclear allergy   - Line holiday, HD catheter discontinued 1/23, on meropenem   Possible  necrotizing fasciitis -Patient with rapidly progressive lower extremity edema, swelling and fluid  collection concerning for necrotizing fasciitis. - General Surgery consulted, plan to take to the OR, as discussed it is clear where there is no indication for imaging. - Will add vancomycin  to meropenem  -PCCM consulted.  History of left groin abscess -Status post I&D by Dr. Vernetta 02/16/2024 -CT abdomen pelvis with no acute finding initially, she was seen by orthopedic Dr. Harden with no concern of acute infection 03/22/2024   ESRD on HD (TTS) - -management per renal, she was dialyzed yesterday, currently in line holiday,  Chronic hypotension -Profoundly hypotensive initially requiring pressors, currently on high-dose midodrine  20 mg p.o. 3 times daily, remains hypotensive, she remains on albumin  as well    Opioid-use disorder Hx substance abuse - as an OP following with [facility], minimize narcotics.  Chronic pain, Hx fibromyalgia, Acute on chronic abdominal pain - continue supportive care counseled to minimize narcotic intake as blood pressure at baseline is very low.   Anemia of chronic disease   -drop in Hb may be dilutional. No evidence of DIC or bleeding, received units PRBC yesterday for hemoglobin of 6.7, hemoglobin stable this morning at 9.7.    History of Cdiff  -  cdiff negative,  on prophylactic dose PO vanc, will give Imodium    Syncope; suspect due to sepsis   - echo: EF 60-605%, mild LVH, small pericardial effusion posteriorly    Troponin elevation; likely demand d/t shock state, PAF -not on anticoagulation due to past history of GI bleed, troponin elevation due to demand mismatch from sepsis, trend is flat and in non-ACS pattern, echo shows preserved EF and no wall motion abnormality.  Chest pain-free no further workup.  She developed RVR with hypotension for which she was seen by cardiology and started on amiodarone  drip on 03/23/2024. -Heart rate is up again today, so we will give IV amiodarone  load   HLD   - PTA statin   Hx duodenal carcinoid tumor  s/p resection (2022), Hx gastric ulcer & gastritis (2024) - Chronic gastroparesis  -  con't H2-blocker; stable.   S/p I&D for left thigh abscess 02/16/24.   - Stable on CT scan, orthopedic input greatly appreciated   Wounds present on admission - left thigh, R hip, lower abdomen, buttocks, L heel. turns, WOC as needed Dr. Harden to evaluate left thigh postop site.  PVD s/p R BKA- stump is well healed   Sacral pressure injury - turns, unfortunately needs pressors, hard to keep that skin clear with frequent diarrhea   Possible vaginal yeast infection  -Clotrimazole  cream daily x7 days   Anxiety/Depression -Resume home buspar , risperidone , trazodone  when appropriate> waiting for mental status to normalize some   DM2; hypoglycemia from sepsis corrected with steroids - On sliding scale, low-dose Semglee , CBGs low overnight requiring D5W,  Lab Results  Component Value Date   HGBA1C 4.3 (L) 02/15/2024   CBG (last 3)  Recent Labs    03/31/24 2320 04/01/24 0420 04/01/24 0741  GLUCAP 77 115* 112*    Difficult stick -Patient is extremely difficult stick, with multiple attempts by IV team for access and blood draws with no success, as well she is not a candidate for any central line currently with her bacteremia, and this limits her blood draws repeat blood cultures significantly, does increase her risk, but unfortunately there is no safe intervention at this point  Goals of care - Palliative medicine has been following closely with patient, discussed with the patient in the past as well that we should consider CODE STATUS especially with overall she has poor prognosis with multiple hospitalizations, with deconditioning, frequent admissions with infections, poor compliance with the patient, when it comes to dietary restrictions, general care orders as well, patient remains full code - I have discussed with the patient as well today, that her prognosis remains very poor, and there is very high  risk of not surviving this hospital stay      Condition - Extremely Guarded  Family Communication  : Discussed with mother by phone  Code Status : Full code  Consults  : PCCM, nephrology, orthopedic, ID  PUD Prophylaxis :    Procedures  :           Disposition Plan  :    Status is: Inpatient   DVT Prophylaxis  :    Place and maintain sequential compression device Start: 03/20/24 1800  Lab Results  Component Value Date   PLT 324 03/30/2024    Diet :  Diet Order             Diet NPO time specified Except for: Sips with Meds  Diet effective midnight           Diet renal with fluid restriction Fluid restriction: 1200 mL Fluid; Room service appropriate? Yes; Fluid consistency: Thin  Diet effective now                    Inpatient Medications  Scheduled Meds:  sodium chloride    Intravenous Once   sodium chloride    Intravenous Once   sodium chloride    Intravenous Once   amiodarone   200 mg Oral Daily   ascorbic acid   500 mg Oral BID   atorvastatin   10 mg Oral Daily   busPIRone   5 mg Oral TID   collagenase    Topical Daily   darbepoetin (ARANESP ) injection - DIALYSIS  150 mcg Subcutaneous Q Tue-1800   famotidine   20 mg Oral QHS   feeding supplement  1 Container Oral Q24H   fluconazole   200 mg Oral  Weekly   heparin  sodium (porcine)  1.9 mL Intracatheter Once   insulin  aspart  0-5 Units Subcutaneous QHS   insulin  aspart  0-9 Units Subcutaneous TID WC   midodrine   20 mg Oral Q8H   multivitamin  1 tablet Oral QHS   mouth rinse  15 mL Mouth Rinse 4 times per day   risperiDONE   1 mg Oral QHS   sodium chloride  flush  10-40 mL Intracatheter Q12H   Continuous Infusions:  albumin  human 25 g (04/01/24 0434)   ertapenem  500 mg (03/31/24 2250)   vancomycin      PRN Meds:.acetaminophen , acetaminophen , hydrOXYzine , loperamide , mouth rinse, polyethylene glycol, senna    Objective:   Vitals:   04/01/24 0415 04/01/24 0500 04/01/24 0700 04/01/24 0739  BP: 120/88  (!) 100/41  (!) 97/57  Pulse: (!) 107     Resp: 16  13   Temp: (!) 101.4 F (38.6 C) 99.8 F (37.7 C)  99.5 F (37.5 C)  TempSrc: Axillary   Axillary  SpO2:    92%  Weight:  96.5 kg    Height:        Wt Readings from Last 3 Encounters:  04/01/24 96.5 kg  02/25/24 97.2 kg  06/20/23 114.5 kg     Intake/Output Summary (Last 24 hours) at 04/01/2024 1019 Last data filed at 03/31/2024 1230 Gross per 24 hour  Intake 250 ml  Output --  Net 250 ml       Physical Exam  Awake, frail, Chronically ill-appearing, deconditioned, tearful . Good air entry  Regular rate and rhythm  Abdomen soft  Right BKA, lower extremity with bilateral medial thigh redness, swelling, please see pictures below . Upper extremity with edema, developing redness and tenderness in the posterior arm area.(Please see pictures under media section)          RN pressure injury documentation: Wound 02/19/24 0200 Pressure Injury Foot Left;Lateral Unstageable - Full thickness tissue loss in which the base of the injury is covered by slough (yellow, tan, gray, green or brown) and/or eschar (tan, brown or black) in the wound bed. (Active)     Wound 03/17/24 0045 Pressure Injury Thigh Left;Posterior Stage 2 -  Partial thickness loss of dermis presenting as a shallow open injury with a red, pink wound bed without slough. (Active)     Wound 03/17/24 0045 Pressure Injury Hip Lateral;Right Stage 2 -  Partial thickness loss of dermis presenting as a shallow open injury with a red, pink wound bed without slough. (Active)     Wound 03/17/24 0045 Pressure Injury Abdomen Lower;Left Stage 2 -  Partial thickness loss of dermis presenting as a shallow open injury with a red, pink wound bed without slough. (Active)     Wound 03/17/24 1119 Pressure Injury Buttocks Right;Left;Medial Stage 3 -  Full thickness tissue loss. Subcutaneous fat may be visible but bone, tendon or muscle are NOT exposed. (Active)      Data Review:     Recent Labs  Lab 03/27/24 1328 03/29/24 1230 03/30/24 0846 03/30/24 1454  WBC 23.2* 19.4* 15.1* 16.1*  HGB 8.1* 8.1* 6.7* 9.7*  HCT 24.9* 25.7* 20.4* 28.3*  PLT 370 390 337 324  MCV 97.6 100.8* 98.6 92.5  MCH 31.8 31.8 32.4 31.7  MCHC 32.5 31.5 32.8 34.3  RDW 19.8* 19.8* 19.2* 19.1*  LYMPHSABS 2.0  --  2.0  --   MONOABS 1.7*  --  0.7  --   EOSABS 0.2  --  0.2  --  BASOSABS 0.1  --  0.1  --     Recent Labs  Lab 03/27/24 1327 03/29/24 1230 03/30/24 0846  NA 139 138 140  K 3.7 3.8 3.5  CL 105 102 106  CO2 24 24 23   ANIONGAP 10 12 12   GLUCOSE 162* 163* 85  BUN 30* 22* 25*  CREATININE 4.33* 3.84* 4.31*  ALBUMIN  1.7* 1.9* 2.6*  MG 2.1 1.9 2.1  PHOS 4.5 4.3 4.3  CALCIUM  7.1* 7.4* 8.0*      Recent Labs  Lab 03/27/24 1327 03/29/24 1230 03/30/24 0846  MG 2.1 1.9 2.1  CALCIUM  7.1* 7.4* 8.0*    --------------------------------------------------------------------------------------------------------------- Lab Results  Component Value Date   CHOL 104 07/22/2019   HDL 30 (L) 07/22/2019   LDLCALC 37 07/22/2019   TRIG 185 (H) 07/22/2019   CHOLHDL 3.5 07/22/2019    Lab Results  Component Value Date   HGBA1C 4.3 (L) 02/15/2024   No results for input(s): TSH, T4TOTAL, FREET4, T3FREE, THYROIDAB in the last 72 hours. No results for input(s): VITAMINB12, FOLATE, FERRITIN, TIBC, IRON , RETICCTPCT in the last 72 hours. ------------------------------------------------------------------------------------------------------------------ Cardiac Enzymes No results for input(s): CKMB, TROPONINI, MYOGLOBIN in the last 168 hours.  Invalid input(s): CK  Radiology Report DG Chest Port 1 View Result Date: 03/31/2024 EXAM: 1 VIEW(S) XRAY OF THE CHEST 03/31/2024 07:31:00 AM COMPARISON: 03/25/2024 CLINICAL HISTORY: Fever. FINDINGS: LINES, TUBES AND DEVICES: Right IJ central venous catheter removed. LUNGS AND PLEURA: Low lung volumes. Vague left  perihilar airspace opacities. No pleural effusion. No pneumothorax. HEART AND MEDIASTINUM: No acute abnormality of the cardiac and mediastinal silhouettes. BONES AND SOFT TISSUES: No acute osseous abnormality. IMPRESSION: 1. Vague left perihilar airspace opacities, which may reflect atelectasis related to low lung volumes versus developing infection. Electronically signed by: Dayne Hassell MD 03/31/2024 03:18 PM EST RP Workstation: [id number]   IR Removal Tun Cv Cath W/O FL Result Date: 03/30/2024 INDICATION: History of ESRD requiring hemodialysis. Previous tunneled HD catheter placed 06/24/2022. Currently admitted with bacteremia. Request for tunneled HD catheter removal for line holiday. EXAM: REMOVAL TUNNELED CENTRAL VENOUS CATHETER MEDICATIONS: 3 mL 1% lidocaine  ANESTHESIA/SEDATION: None FLUOROSCOPY: None COMPLICATIONS: None immediate. PROCEDURE: Informed written consent was obtained from the patient after a thorough discussion of the procedural risks, benefits and alternatives. All questions were addressed. Maximal Sterile Barrier Technique was utilized including caps, mask, sterile gowns, sterile gloves, sterile drape, hand hygiene and skin antiseptic. A timeout was performed prior to the initiation of the procedure. The patient's right chest and catheter was prepped and draped in a normal sterile fashion. Heparin  was removed from both ports of catheter. 1% lidocaine  was used for local anesthesia. Using gentle blunt dissection the cuff of the catheter was exposed and the catheter was removed in it's entirety. Pressure was held till hemostasis was obtained. A sterile dressing was applied. The patient tolerated the procedure well with no immediate complications. IMPRESSION: Successful catheter removal as described above. Performed by: Wyatt Pommier, PA-C Electronically Signed   By: CHRISTELLA.  Shick M.D.   On: 03/30/2024 16:04      Signature  -   Brayton Lye M.D on 04/01/2024 at 10:19 AM   -  To page go to  [email or web address]   "

## 2024-04-01 NOTE — Anesthesia Procedure Notes (Signed)
 Central Venous Catheter Insertion Performed by: Dene Lauraine DASEN, MD, anesthesiologist Start/End1/25/2026 2:10 PM, 04/01/2024 2:10 PM Patient location: OR. Preanesthetic checklist: patient identified, IV checked, site marked, risks and benefits discussed, surgical consent, monitors and equipment checked, pre-op  evaluation, timeout performed and anesthesia consent Position: Trendelenburg Patient sedated Hand hygiene performed  and maximum sterile barriers used  Catheter size: 9 Fr Total catheter length 11. Central line was placed.MAC introducer Procedure performed using ultrasound to evaluate access site. Ultrasound Notes:relevant anatomy identified, ultrasound used to visualize needle entry, vessel patent under ultrasound and image(s) printed for medical record. Attempts: 1 Following insertion, line sutured and dressing applied. Post procedure assessment: blood return through all ports, free fluid flow and no air  Patient tolerated the procedure well with no immediate complications.

## 2024-04-01 NOTE — Consult Note (Signed)
 Reason for Consult:NSTI - bilateral lower extremities/ perineum Referring Physician: Elgergawy  MARYLYNNE KEELIN is an 59 y.o. female.  HPI: This is a 59 year old female with multiple medical issues including atrial fibrillation, not on anticoagulation, ESRD on hemodialysis, DM2, hyperlipidemia, hx of right BKA, CVA w residual left-sided deficits, chronic hypotension.  She underwent debridement of a large left thigh abscess 02/16/24 by Dr. Vernetta.  She was readmitted 03/16/24 with syncope.  The wound has been granulating well.  She has had some pressure ulcer to the left heel and lateral left ankle.  She has also had an exfoliating rash over her body.  Overnight she became febrile with significant worsening of bilateral groin and lower extremities.  Now with apparent fluctuance.  More hypotensive.  No new labs today.  Blood cultures 03/16/24 positive for Klebsiella pneumoniae.    Due to the dramatic change in her physical examination of the lower extremities and perineum, we are called to see her urgently.  The patient has limited insight into her situation.  Last time, we had to obtain consent from her mother.    Past Medical History:  Diagnosis Date   Acute back pain with sciatica, left    Acute back pain with sciatica, right    Acute encephalopathy 05/29/2022   Acute osteomyelitis of right calcaneus (HCC) 12/21/2022   Anemia, unspecified    Atrial fibrillation with RVR (HCC)-resolved 09/09/2022   Atypical chest pain 09/10/2021   Cancer (HCC)    Carcinoid tumor of duodenum (HCC)    Chest pain with normal coronary angiography 2019   Chronic a-fib (HCC) 09/09/2022   Chronic pain    Chronic systolic CHF (congestive heart failure) (HCC)    Dehiscence of amputation stump of right lower extremity (HCC) 01/29/2023   Diabetes mellitus    DKA (diabetic ketoacidosis) (HCC)    Drug-seeking behavior    21 hospitalizations and 14 CT a/p in 2 years for N/V and abdominal pain, demanding only IV  dilaudid    Elevated troponin    chronic   Esophageal reflux    Fibromyalgia    Gastric ulcer    Gastroparesis    Gout    HCAP (healthcare-associated pneumonia) 06/19/2022   Hyperlipidemia    Hyperosmolar hyperglycemic state (HHS) (HCC) 05/11/2022   Hyperosmolar non-ketotic state due to type 2 diabetes mellitus (HCC) 05/11/2022   Hypertension    Hypomagnesemia    Lumbosacral stenosis    LVH (left ventricular hypertrophy)    Morbid obesity (HCC)    Nausea & vomiting 09/09/2022   NICM (nonischemic cardiomyopathy) (HCC)    PAF (paroxysmal atrial fibrillation) (HCC)    Sepsis (HCC) 11/23/2022   Stroke (HCC) 02/2011   Symptomatic anemia 12/14/2022   Thrombocytosis    Vitamin B12 deficiency anemia     Past Surgical History:  Procedure Laterality Date   ABDOMINAL AORTOGRAM W/LOWER EXTREMITY N/A 11/29/2022   Procedure: ABDOMINAL AORTOGRAM W/LOWER EXTREMITY;  Surgeon: Pearline Norman RAMAN, MD;  [facility];  Service: Cardiovascular;  Laterality: N/A;   AMPUTATION Right 12/29/2022   Procedure: RIGHT BELOW KNEE AMPUTATION;  Surgeon: Harden Jerona GAILS, MD;  Location: [facility] OR;  Service: Orthopedics;  Laterality: Right;   AV FISTULA PLACEMENT Left 06/30/2022   Procedure: LEFT BRACHIOCEPHALIC ARTERIOVENOUS (AV) FISTULA CREATION;  Surgeon: Gretta Lonni PARAS, MD;  Location: [facility] OR;  Service: Vascular;  Laterality: Left;   BIOPSY  07/27/2019   Procedure: BIOPSY;  Surgeon: Rosalie Kitchens, MD;  Location: [facility];  Service: Endoscopy;;  BIOPSY  07/30/2019   Procedure: BIOPSY;  Surgeon: Elicia Claw, MD;  Location: [facility];  Service: Gastroenterology;;   BIOPSY  04/28/2023   Procedure: BIOPSY;  Surgeon: Federico Rosario BROCKS, MD;  Location: [facility] ENDOSCOPY;  Service: Gastroenterology;;   CATARACT EXTRACTION  01/2014   CHOLECYSTECTOMY     COLONOSCOPY WITH PROPOFOL  N/A 07/30/2019   Procedure: COLONOSCOPY WITH PROPOFOL ;  Surgeon: Elicia Claw, MD;  Location: [facility];  Service:  Gastroenterology;  Laterality: N/A;   ESOPHAGOGASTRODUODENOSCOPY N/A 07/27/2019   Procedure: ESOPHAGOGASTRODUODENOSCOPY (EGD);  Surgeon: Rosalie Kitchens, MD;  Location: [facility];  Service: Endoscopy;  Laterality: N/A;   ESOPHAGOGASTRODUODENOSCOPY N/A 07/26/2020   Procedure: ESOPHAGOGASTRODUODENOSCOPY (EGD);  Surgeon: Burnette Fallow, MD;  Location: [facility];  Service: Endoscopy;  Laterality: N/A;   ESOPHAGOGASTRODUODENOSCOPY (EGD) WITH PROPOFOL  N/A 08/02/2019   Procedure: ESOPHAGOGASTRODUODENOSCOPY (EGD) WITH PROPOFOL ;  Surgeon: Elicia Claw, MD;  Location: [facility];  Service: Gastroenterology;  Laterality: N/A;   ESOPHAGOGASTRODUODENOSCOPY (EGD) WITH PROPOFOL  N/A 12/23/2022   Procedure: ESOPHAGOGASTRODUODENOSCOPY (EGD) WITH PROPOFOL ;  Surgeon: San Sandor GAILS, DO;  Location: [facility];  Service: Gastroenterology;  Laterality: N/A;   ESOPHAGOGASTRODUODENOSCOPY (EGD) WITH PROPOFOL  N/A 04/28/2023   Procedure: ESOPHAGOGASTRODUODENOSCOPY (EGD) WITH PROPOFOL ;  Surgeon: Federico Rosario BROCKS, MD;  Location[facility] ENDOSCOPY;  Service: Gastroenterology;  Laterality: N/A;   FISTULA SUPERFICIALIZATION Left 12/31/2022   Procedure: LEFT ARM FISTULA TRANSPOSITION;  Surgeon: Sheree Penne Bruckner, MD;  Location: [facility] OR;  Service: Vascular;  Laterality: Left;   GIVENS CAPSULE STUDY N/A 12/23/2022   Procedure: GIVENS CAPSULE STUDY;  Surgeon: San Sandor GAILS, DO;  Location: [facility]OS[facility];  Service: Gastroenterology;  Laterality: N/A;   HEMOSTASIS CLIP PLACEMENT  08/02/2019   Procedure: HEMOSTASIS CLIP PLACEMENT;  Surgeon: Elicia Claw, MD;  Location: [facility];  Service: Gastroenterology;;   HOT HEMOSTASIS N/A 12/23/2022   Procedure: HOT HEMOSTASIS (ARGON PLASMA COAGULATION/BICAP);  Surgeon: San Sandor GAILS, DO;  Location: [facility]OSCOPY;  Service: Gastroenterology;  Laterality: N/A;   INCISION AND DRAINAGE ABSCESS Left 02/16/2024   Procedure: INCISION AND DRAINAGE, ABSCESS;  Surgeon: Vernetta Berg, MD;  Location: [facility] OR;  Service: General;  Laterality: Left;  LEFT THIGH ABSCESS   IR FLUORO GUIDE CV LINE RIGHT  06/24/2022   IR REMOVAL TUN CV CATH W/O FL  03/30/2024   IR US  GUIDE VASC ACCESS RIGHT  06/24/2022   POLYPECTOMY  07/30/2019   Procedure: POLYPECTOMY;  Surgeon: Elicia Claw, MD;  Location: [facility];  Service: Gastroenterology;;   POLYPECTOMY  08/02/2019   Procedure: POLYPECTOMY;  Surgeon: Elicia Claw, MD;  Location: [facility];  Service: Gastroenterology;;   HARLEY DILATION N/A 04/28/2023   Procedure: HARLEY DILATION;  Surgeon: Federico Rosario BROCKS, MD;  Location: [facility] ENDOSCOPY;  Service: Gastroenterology;  Laterality: N/A;   STUMP REVISION Right 02/02/2023   Procedure: REVISION RIGHT BELOW KNEE AMPUTATION;  Surgeon: Harden Jerona GAILS, MD;  Location: [facility] OR;  Service: Orthopedics;  Laterality: Right;    Family History  Problem Relation Age of Onset   Diabetes Mother    Diabetes Father    Heart disease Father    Diabetes Sister    Congestive Heart Failure Sister 49   Diabetes Brother     Social History:  reports that she has never smoked. She has never used smokeless tobacco. She reports that she does not drink alcohol and does not use drugs.  Allergies: Allergies[1]  Medications:  Prior to Admission medications  Medication Sig Start Date End Date Taking? Authorizing Provider  albuterol  (PROVENTIL ) (2.5  MG/3ML) 0.083% nebulizer solution Take 3 mLs (2.5 mg total) by nebulization every 6 (six) hours as needed for wheezing or shortness of breath. 04/06/19  Yes Arloa Suzen RAMAN, NP  allopurinol  (ZYLOPRIM ) 100 MG tablet Take 1 tablet (100 mg total) by mouth 2 (two) times daily. 11/30/22  Yes Lue Elsie BROCKS, MD  ascorbic acid  (VITAMIN C ) 500 MG tablet Take 500 mg by mouth daily.   Yes [provider]  atorvastatin  (LIPITOR) 10 MG tablet Take 10 mg by mouth every evening. 12/03/21  Yes [provider]  b complex-vitamin c -folic acid  (NEPHRO-VITE)  0.8 MG TABS tablet Take 1 tablet by mouth at bedtime. 06/17/23  Yes Danton Reyes DASEN, MD  busPIRone  (BUSPAR ) 10 MG tablet Take 10 mg by mouth 3 (three) times daily.   Yes [provider]  calcium  carbonate (TUMS - DOSED IN MG ELEMENTAL CALCIUM ) 500 MG chewable tablet Chew 1 tablet (200 mg of elemental calcium  total) by mouth 2 (two) times daily as needed for indigestion or heartburn. 06/17/23  Yes Danton Reyes DASEN, MD  cetirizine  (ZYRTEC ) 10 MG tablet Take 10 mg by mouth daily. 08/05/22  Yes [provider]  cholestyramine  light (PREVALITE ) 4 g packet Take 1 packet (4 g total) by mouth 2 (two) times daily. 06/17/23  Yes Danton Reyes DASEN, MD  diclofenac  Sodium (VOLTAREN ) 1 % GEL Apply 2 g topically 4 (four) times daily. 06/17/23  Yes Danton Reyes DASEN, MD  famotidine  (PEPCID ) 20 MG tablet Take 20 mg by mouth daily before breakfast.   Yes [provider]  fluticasone  (FLONASE ) 50 MCG/ACT nasal spray Place 2 sprays into both nostrils daily as needed for allergies or rhinitis. Patient taking differently: Place 2 sprays into both nostrils in the morning and at bedtime. 12/19/18  Yes Rai, Ripudeep K, MD  folic acid  (FOLVITE ) 1 MG tablet Take 1 tablet (1 mg total) by mouth daily. 01/10/23  Yes Danford, Lonni SQUIBB, MD  hydrocerin (EUCERIN) CREA Apply 1 Application topically 2 (two) times daily. Apply to L leg and foot 2 times daily 02/25/24  Yes Ghimire, Donalda HERO, MD  HYDROcodone -acetaminophen  (NORCO/VICODIN) 5-325 MG tablet Take 1-2 tablets by mouth every 6 (six) hours as needed for moderate pain (pain score 4-6). 02/25/24  Yes Ghimire, Donalda HERO, MD  hydrocortisone  cream 1 % Apply 1 Application topically 2 (two) times daily.   Yes [provider]  HYDROmorphone  (DILAUDID ) 2 MG tablet Take 1 tablet (2 mg total) by mouth every 6 (six) hours as needed for severe pain (pain score 7-10). 02/25/24  Yes Ghimire, Donalda HERO, MD  hydrOXYzine  (ATARAX ) 25 MG tablet Take 1 tablet  (25 mg total) by mouth every 4 (four) hours as needed for anxiety. 06/17/23  Yes Danton Reyes DASEN, MD  hyoscyamine  (LEVSIN  SL) 0.125 MG SL tablet Place 1 tablet (0.125 mg total) under the tongue 3 (three) times daily. 06/17/23  Yes Danton Reyes DASEN, MD  loperamide  (IMODIUM ) 2 MG capsule Take 1 capsule (2 mg total) by mouth every 6 (six) hours. 06/17/23  Yes Danton Reyes DASEN, MD  methocarbamol  (ROBAXIN ) 500 MG tablet Take 1 tablet (500 mg total) by mouth every 6 (six) hours as needed for muscle spasms. 01/10/23  Yes Danford, Lonni SQUIBB, MD  midodrine  (PROAMATINE ) 10 MG tablet Take 1 tablet (10 mg total) by mouth every Monday, Wednesday, and Friday with hemodialysis. 06/17/23  Yes Danton Reyes DASEN, MD  mirtazapine  (REMERON ) 15 MG tablet Take 15 mg by mouth at bedtime. 10/18/22  Yes [provider]  nicotine (NICODERM CQ - DOSED IN MG/24 HR) 7 mg/24hr patch Place 7 mg onto the skin daily.   Yes [provider]  Nutritional Supplements (,FEEDING SUPPLEMENT, PROSOURCE PLUS) liquid Take 30 mLs by mouth 2 (two) times daily between meals. 01/10/23  Yes Danford, Lonni SQUIBB, MD  Nystatin (GERHARDT'S BUTT CREAM) CREA Apply 1 Application topically 3 (three) times daily. 02/25/24  Yes Ghimire, Donalda HERO, MD  ondansetron  (ZOFRAN ) 4 MG tablet Take 4 mg by mouth every 8 (eight) hours as needed for nausea or vomiting.   Yes [provider]  pantoprazole  (PROTONIX ) 40 MG tablet Take 1 tablet (40 mg total) by mouth 2 (two) times daily. 02/18/23  Yes Raulkar, Sven SQUIBB, MD  promethazine  (PHENERGAN ) 25 MG tablet Take 1 tablet (25 mg total) by mouth 4 (four) times daily -  before meals and at bedtime. 06/17/23  Yes Danton Reyes DASEN, MD  risperiDONE  (RISPERDAL  M-TABS) 1 MG disintegrating tablet Take 1 tablet (1 mg total) by mouth at bedtime. 05/04/23  Yes Sebastian Toribio GAILS, MD  saccharomyces boulardii (FLORASTOR) 250 MG capsule Take 250 mg by mouth in the morning, at noon, and at bedtime.    Yes [provider]  sevelamer carbonate (RENVELA) 800 MG tablet Take 1,600 mg by mouth 3 (three) times daily with meals.   Yes [provider]  traZODone  (DESYREL ) 50 MG tablet Take 1 tablet (50 mg total) by mouth at bedtime. Patient taking differently: Take 75 mg by mouth at bedtime. 06/17/23  Yes Danton Reyes DASEN, MD     Results for orders placed or performed during the hospital encounter of 03/16/24 (from the past 48 hours)  CBC     Status: Abnormal   Collection Time: 03/30/24  2:54 PM  Result Value Ref Range   WBC 16.1 (H) 4.0 - 10.5 K/uL   RBC 3.06 (L) 3.87 - 5.11 MIL/uL   Hemoglobin 9.7 (L) 12.0 - 15.0 g/dL    Comment: REPEATED TO VERIFY POST TRANSFUSION SPECIMEN    HCT 28.3 (L) 36.0 - 46.0 %   MCV 92.5 80.0 - 100.0 fL   MCH 31.7 26.0 - 34.0 pg   MCHC 34.3 30.0 - 36.0 g/dL   RDW 80.8 (H) 88.4 - 84.4 %   Platelets 324 150 - 400 K/uL    Comment: REPEATED TO VERIFY   nRBC 0.0 0.0 - 0.2 %    Comment: Performed at [facility], [address], [place], [place] [address]  Glucose, capillary     Status: None   Collection Time: 03/30/24  4:28 PM  Result Value Ref Range   Glucose-Capillary 71 70 - 99 mg/dL    Comment: Glucose reference range applies only to samples taken after fasting for at least 8 hours.  Glucose, capillary     Status: None   Collection Time: 03/30/24  9:03 PM  Result Value Ref Range   Glucose-Capillary 90 70 - 99 mg/dL    Comment: Glucose reference range applies only to samples taken after fasting for at least 8 hours.  Glucose, capillary     Status: None   Collection Time: 03/31/24 12:45 AM  Result Value Ref Range   Glucose-Capillary 90 70 - 99 mg/dL    Comment: Glucose reference range applies only to samples taken after fasting for at least 8 hours.  Glucose, capillary     Status: Abnormal   Collection Time: 03/31/24  4:17 AM  Result Value Ref Range  Glucose-Capillary 67 (L) 70 - 99 mg/dL    Comment: Glucose reference range  applies only to samples taken after fasting for at least 8 hours.  Glucose, capillary     Status: Abnormal   Collection Time: 03/31/24  4:19 AM  Result Value Ref Range   Glucose-Capillary 69 (L) 70 - 99 mg/dL    Comment: Glucose reference range applies only to samples taken after fasting for at least 8 hours.  Glucose, capillary     Status: Abnormal   Collection Time: 03/31/24  4:48 AM  Result Value Ref Range   Glucose-Capillary 119 (H) 70 - 99 mg/dL    Comment: Glucose reference range applies only to samples taken after fasting for at least 8 hours.  Glucose, capillary     Status: None   Collection Time: 03/31/24  8:03 AM  Result Value Ref Range   Glucose-Capillary 73 70 - 99 mg/dL    Comment: Glucose reference range applies only to samples taken after fasting for at least 8 hours.  Glucose, capillary     Status: None   Collection Time: 03/31/24 11:51 AM  Result Value Ref Range   Glucose-Capillary 71 70 - 99 mg/dL    Comment: Glucose reference range applies only to samples taken after fasting for at least 8 hours.  Glucose, capillary     Status: Abnormal   Collection Time: 03/31/24  4:05 PM  Result Value Ref Range   Glucose-Capillary 112 (H) 70 - 99 mg/dL    Comment: Glucose reference range applies only to samples taken after fasting for at least 8 hours.  Glucose, capillary     Status: None   Collection Time: 03/31/24  8:39 PM  Result Value Ref Range   Glucose-Capillary 72 70 - 99 mg/dL    Comment: Glucose reference range applies only to samples taken after fasting for at least 8 hours.  Glucose, capillary     Status: None   Collection Time: 03/31/24 11:20 PM  Result Value Ref Range   Glucose-Capillary 77 70 - 99 mg/dL    Comment: Glucose reference range applies only to samples taken after fasting for at least 8 hours.   Comment 1 Notify RN    Comment 2 Document in Chart   Glucose, capillary     Status: Abnormal   Collection Time: 04/01/24  4:20 AM  Result Value Ref Range    Glucose-Capillary 115 (H) 70 - 99 mg/dL    Comment: Glucose reference range applies only to samples taken after fasting for at least 8 hours.  Glucose, capillary     Status: Abnormal   Collection Time: 04/01/24  7:41 AM  Result Value Ref Range   Glucose-Capillary 112 (H) 70 - 99 mg/dL    Comment: Glucose reference range applies only to samples taken after fasting for at least 8 hours.    DG Chest Port 1 View Result Date: 03/31/2024 EXAM: 1 VIEW(S) XRAY OF THE CHEST 03/31/2024 07:31:00 AM COMPARISON: 03/25/2024 CLINICAL HISTORY: Fever. FINDINGS: LINES, TUBES AND DEVICES: Right IJ central venous catheter removed. LUNGS AND PLEURA: Low lung volumes. Vague left perihilar airspace opacities. No pleural effusion. No pneumothorax. HEART AND MEDIASTINUM: No acute abnormality of the cardiac and mediastinal silhouettes. BONES AND SOFT TISSUES: No acute osseous abnormality. IMPRESSION: 1. Vague left perihilar airspace opacities, which may reflect atelectasis related to low lung volumes versus developing infection. Electronically signed by: Dayne Hassell MD 03/31/2024 03:18 PM EST RP Workstation: [id number]   IR Removal Tun Cv Cath W/O  FL Result Date: 03/30/2024 INDICATION: History of ESRD requiring hemodialysis. Previous tunneled HD catheter placed 06/24/2022. Currently admitted with bacteremia. Request for tunneled HD catheter removal for line holiday. EXAM: REMOVAL TUNNELED CENTRAL VENOUS CATHETER MEDICATIONS: 3 mL 1% lidocaine  ANESTHESIA/SEDATION: None FLUOROSCOPY: None COMPLICATIONS: None immediate. PROCEDURE: Informed written consent was obtained from the patient after a thorough discussion of the procedural risks, benefits and alternatives. All questions were addressed. Maximal Sterile Barrier Technique was utilized including caps, mask, sterile gowns, sterile gloves, sterile drape, hand hygiene and skin antiseptic. A timeout was performed prior to the initiation of the procedure. The patient's right chest  and catheter was prepped and draped in a normal sterile fashion. Heparin  was removed from both ports of catheter. 1% lidocaine  was used for local anesthesia. Using gentle blunt dissection the cuff of the catheter was exposed and the catheter was removed in it's entirety. Pressure was held till hemostasis was obtained. A sterile dressing was applied. The patient tolerated the procedure well with no immediate complications. IMPRESSION: Successful catheter removal as described above. Performed by: Wyatt Pommier, PA-C Electronically Signed   By: CHRISTELLA.  Shick M.D.   On: 03/30/2024 16:04    Blood pressure (!) 97/57, pulse (!) 107, temperature 99.5 F (37.5 C), temperature source Axillary, resp. rate 13, height 5' 6 (1.676 m), weight 96.5 kg, last menstrual period 10/10/2012, SpO2 92%. Physical Exam Obese female - Awake, talking Tachycardic Abdomen - obese, soft, non-tender Perineum/ extending down medial left lower extremity to the calf and down the medial right thigh - erythematous, fluctuance with apparent fluid behind the intact skin; exquisitely tender to palpation.  Unable to get a thorough examination of the perineum due to the patient's body habitus and position in bed, also due to the tenderness with movement. Left thigh wound - granulating Right BKA stump without sign of infection  Assessment/Plan: Probable necrotizing soft tissue infection of the perineum, left medial lower extremity/ right medial thigh  Would recommend immediate surgery for wide debridement of NSTI.  This will likely result in a massive wound.  She will likely remain intubated and will need to have multiple debridements/ dressing changes under anesthesia.    I talked briefly with the mother on speakerphone, as the patient doesn't seem to have insight into her situation.  I explained that she will be very ill for several days and may not survive this process.  Will discuss with the general surgeon on call today.  Donnice POUR  Xayden Linsey 04/01/2024, 10:26 AM         [1]  Allergies Allergen Reactions   Dorethia Bunkers ] Other (See Comments)    Hx of stomach ulcer   Bentyl  [Dicyclomine ] Other (See Comments)    Chest pain   Gabapentin  Hives and [name]   Isovue  [Iopamidol ] Anaphylaxis, [name] and Other (See Comments)    11/28/17 Patient had seizure like activity and then 1 min code after 100 cc of isovue  300. Possible contrast allergy  vs vasovagal episode  Cardiac Arrest   Nsaids Anaphylaxis and Other (See Comments)    Hx of stomach ulcers   Penicillins Itching, Palpitations and Other (See Comments)    Flushing (Red Skin) Laryngeal Edema   Reglan  [Metoclopramide ] Other (See Comments)    Tardive dyskinesia    Valium  [Diazepam ] [name]   Zestril [Lisinopril] Anaphylaxis and Swelling    Tongue and mouth swelling Laryngeal Edema   Tolectin [Tolmetin] Nausea And Vomiting, Nausea Only and Other (See Comments)  Irritates stomach ulcer   Aspartame And Phenylalanine Hives   Flexeril  [Cyclobenzaprine ] Palpitations   Hibiclens  [Chlorhexidine  Gluconate] Dermatitis   Oxycontin  [Oxycodone ] Palpitations   Rifamycins Palpitations   Tylenol  [Acetaminophen ] Nausea And Vomiting, Nausea Only and Other (See Comments)    Irritates stomach ulcer Abdominal pain   Ultram  [Tramadol ] Nausea And Vomiting and Palpitations

## 2024-04-01 NOTE — TOC Progression Note (Signed)
 Transition of Care [facility]) - Progression Note    Patient Details  Name: Deborah [name] MRN: [id number] Date of Birth: 04-25-1965  Transition of [facility]) CM/SW Contact  Inocente GORMAN Kindle, LCSW Phone Number: 04/01/2024, 11:19 AM  Clinical Narrative:    CSW continuing to follow for medical progression.   Expected Discharge Plan: Long Term Nursing Home Barriers to Discharge: Continued Medical Work up               Expected Discharge Plan and Services       Living arrangements for the past 2 months: Skilled Nursing Facility                                       Social Drivers of Health (SDOH) Interventions SDOH Screenings   Food Insecurity: No Food Insecurity (03/18/2024)  Housing: Low Risk (03/18/2024)  Transportation Needs: No Transportation Needs (03/18/2024)  Utilities: Not At Risk (03/18/2024)  Recent Concern: Utilities - At Risk (02/18/2024)  Depression (PHQ2-9): Medium Risk (03/16/2023)  Financial Resource Strain: Low Risk (03/23/2021)   Received from [facility])  Social Connections: Low Risk (03/23/2021)   [facility])  Stress: Low Risk (03/23/2021)   Received from [facility])  Tobacco Use: Low Risk (03/18/2024)    Readmission Risk Interventions    03/28/2024    3:24 PM 02/20/2024   11:05 AM 03/07/2023    4:19 PM  Readmission Risk Prevention Plan  Transportation Screening Complete Complete Complete  Medication Review (RN Care Manager) Complete Complete Complete  PCP or Specialist appointment within 3-5 days of discharge Complete Complete Complete  HRI or Home Care Consult Complete Complete Complete  SW Recovery Care/Counseling Consult Complete Complete Complete  Palliative Care Screening Complete Not Applicable Complete  Skilled Nursing Facility Complete Complete Not Applicable

## 2024-04-01 NOTE — Progress Notes (Signed)
 I have seen and evaluated her as well. I also included her mother Therisa over the phone.   Perineum and extending down medial left lower extremity to her calf and down the medial right thigh - remains erythematous, fluctuant with apparent fluid behind the intact skin; exquisitely tender to palpation.  Unable to get a thorough examination of the perineum due to the patient's body habitus and position in bed, also due to the tenderness with movement. Left thigh wound - granulating Right BKA stump without sign of infection  -The anatomy and physiology of the GI tract was discussed with her and her mother (via speaker phone, Therisa Carol). The pathophysiology of necrotizing soft tissue infection was discussed as well. -We have discussed surgery, excisional debridement of necrotizing soft tissue infection of perineum, left lower extremity possible right lower extremity, all other indicated sites. -The planned procedure, material risks (including, but not limited to, pain, bleeding, infection, scarring, need for blood transfusion, damage to surrounding structures- blood vessels/nerves/viscus/organs, probability of needing multiple procedures in her case being extremely high, blood clot, pulmonary embolus, scenarios where a stoma may be necessary and where it may be permanent, worsening of pre-existing medical conditions, recurrence, pneumonia, heart attack, stroke, death) benefits and alternatives to surgery were discussed at length. The patient's questions were answered to her and her mother's satisfaction, they voiced understanding and elected to proceed with surgery. Additionally, we discussed typical postoperative expectations and the recovery process.   Discussed probability of multiple debridements and surgeries over the coming days as well.   Lonni Pizza, MD [facility], [facility]

## 2024-04-01 NOTE — Progress Notes (Signed)
 "  NAME:  Deborah [name], MRN:  [id number], DOB:  23-Sep-1965, LOS: 16 ADMISSION DATE:  03/16/2024, CONSULTATION DATE:  03/16/24 REFERRING MD:  everett, CHIEF COMPLAINT:  syncope  Reconsult 04/01/24 Dr elgergawy c/f marlyce fasc    History of Present Illness:  Deborah [name] is a 59 yo female with past medical history significant for PAF, HLD, right BKA, ESRD on HD (TTS) who presented from SNF to [facility] after syncopal episode with +LOC. Patient hemodynamically unstable in ED despite fluid resuscitation with 3L crystalloid and was subsequently started on vasopressors. Labs notable for metabolic acidosis, sCr 7.04, BUN 23, iCa 1.00, troponin 105, lactic acic 9.0, given 1 amp HCO3. On exam patient with diffuse abdominal pain, exam somewhat limited as she has chronic pain managed daily with Dilaudid . Patient started empirically on Cefepime , Vanc, Flagyl  Of note, patient recently hospitalized 02/14/2024-02/25/2024 for sepsis secondary to left thigh abscess and underwent I&D with gen surg 02/16/2024.   The patient was transferred out of the ICU 1/15  On 1/25 PCCM is reconsulted   Pertinent  Medical History   Past Medical History:  Diagnosis Date   Acute back pain with sciatica, left    Acute back pain with sciatica, right    Acute encephalopathy 05/29/2022   Acute osteomyelitis of right calcaneus (HCC) 12/21/2022   Anemia, unspecified    Atrial fibrillation with RVR (HCC)-resolved 09/09/2022   Atypical chest pain 09/10/2021   Cancer (HCC)    Carcinoid tumor of duodenum (HCC)    Chest pain with normal coronary angiography 2019   Chronic a-fib (HCC) 09/09/2022   Chronic pain    Chronic systolic CHF (congestive heart failure) (HCC)    Dehiscence of amputation stump of right lower extremity (HCC) 01/29/2023   Diabetes mellitus    DKA (diabetic ketoacidosis) (HCC)    Drug-seeking behavior    21 hospitalizations and 14 CT a/p in 2 years for N/V and abdominal pain, demanding only IV dilaudid     Elevated troponin    chronic   Esophageal reflux    Fibromyalgia    Gastric ulcer    Gastroparesis    Gout    HCAP (healthcare-associated pneumonia) 06/19/2022   Hyperlipidemia    Hyperosmolar hyperglycemic state (HHS) (HCC) 05/11/2022   Hyperosmolar non-ketotic state due to type 2 diabetes mellitus (HCC) 05/11/2022   Hypertension    Hypomagnesemia    Lumbosacral stenosis    LVH (left ventricular hypertrophy)    Morbid obesity (HCC)    Nausea & vomiting 09/09/2022   NICM (nonischemic cardiomyopathy) (HCC)    PAF (paroxysmal atrial fibrillation) (HCC)    Sepsis (HCC) 11/23/2022   Stroke (HCC) 02/2011   Symptomatic anemia 12/14/2022   Thrombocytosis    Vitamin B12 deficiency anemia      Significant Hospital Events: Including procedures, antibiotic start and stop dates in addition to other pertinent events    1/9 ED s/p witnessed syncopal event w/ +LOC in shock, started on Levo/Vaso; Aline/CVC placed 1/10 CRRT, still on vaso, NE. Esclated to meropenem  for ESBL Klebsiella 1/12: off levophed , on vasopressin  1/13: off vasopressin , steroids, and CRRT 1/14: transfer out of ICU, [facility] to pick up 1/15  1/15-24 -- [facility] service, ID following. Tunneled HD cath removed 1/23 in light of bacteremia. Difficult to get labs or access.  Hypotensive with high mididrine doses and albumin  boluses    1/25 reconsulted with concern for nec fasc  Interim History / Subjective:   Extension of L thigh wound now R thigh and panus  Not allowing nursing to clean her when she defecates  Not able to get labs   Case discussed with [facility] nephro CCS   Objective    Blood pressure (!) 97/57, pulse (!) 107, temperature 99.5 F (37.5 C), temperature source Axillary, resp. rate 13, height 5' 6 (1.676 m), weight 96.5 kg, last menstrual period 10/10/2012, SpO2 92%.        Intake/Output Summary (Last 24 hours) at 04/01/2024 1107 Last data filed at 03/31/2024 1230 Gross per 24 hour  Intake 250 ml  Output --   Net 250 ml   Filed Weights   03/30/24 0848 03/30/24 1250 04/01/24 0500  Weight: (S) 105.4 kg (S) 104.2 kg 96.5 kg    Examination: General: chronically and acutely ill appearing middle aged F Neuro: awake oriented following commands  HEENT: dry mm poor dentition anicteric sclera  Pulm: symmetrical chest expansion with no accessory muscle use. Not allowing chest auscultation  CV: She is not allowing chest auscultation  GI: Is not allowing abdominal auscultation. Abdomen is soft and sound. She has pitting flank edema. There is erythema and skin maceration in her panus fold and extending up her panus.  Extremities:  RUE edema erythema and skin breakdown R BKA R inner thigh erythema edema +skin breakdown. Painful to light touch but will not allow deeper palpation to assess crepitus.  LLE with erythema edema and skin breakdown extending from calf up L thigh. Painful to light touch and there is fluctuance, but would not allow assessment of crepitus.   Skin: scattered flaking/exfoliating skin rash   Resolved problem list   Assessment and Plan    Septic shock ESBL klebsiella pneumoniae bacteremia Suspected necrotizing fasciitis LLE, RLE, probable perineum -if not, then significant non-necrotizing SSTI  Hx L thigh abscess s/p I&D 12/11 P -to OR urgently -ICU post op  -expected to come out of OR intubated -has been on erta, vanc was added 1/25 -- I'm changing vanc to zyvox   -has been on line holiday since 1/23 but I imaging that will have to end today  -will order post op labs (CMP LA CBC +/- DIC)    ESRD -TDC was removed 1/23  P -nephro following  -expect will need a temp trialysis    DM2  P -SSI   Pressure injuries POA -has been difficult to clean -- fq diarrhea and has apparently regularly refused nursing care  -offload and wound care as best as she will allow    Chronic pain -expect her to remain intubated post op so will be managed w PAD meds    Hx duodenal carcinoid  tumor s/p resectinon Hx gastric ulcer, gastritis  Gastroparesis, chronic  P -H2b  Anemia -will get labs post op    DNR discussion GOC discussion -I worry that her prognosis seems poor. She is lamenting not allowing appropriate nursing care with interval development/progression of her skin wounds and vows to do better. I discussed w her and her mother that medically we are doing all that we can and there are plans for surgery -- but this infection can be life threatening, and in context of her numerous chronic medical problems this is a lot to go through.  -we discussed code status. I recommend DNR-- this recommendation was also shared by two physicians involved in her care and expressed to the patient.  At various points both Rubyann and her mother expressed not wanting to do harm, not wanting to suffer, and contemplated DNR however have decided to maintain full  code status believing that god simply will not allow her heart to stop & if it did god will lead the medical team in successful resuscitation.    Labs   CBC: Recent Labs  Lab 03/27/24 1328 03/29/24 1230 03/30/24 0846 03/30/24 1454  WBC 23.2* 19.4* 15.1* 16.1*  NEUTROABS 19.1*  --  11.9*  --   HGB 8.1* 8.1* 6.7* 9.7*  HCT 24.9* 25.7* 20.4* 28.3*  MCV 97.6 100.8* 98.6 92.5  PLT 370 390 337 324    Basic Metabolic Panel: Recent Labs  Lab 03/27/24 1327 03/29/24 1230 03/30/24 0846  NA 139 138 140  K 3.7 3.8 3.5  CL 105 102 106  CO2 24 24 23   GLUCOSE 162* 163* 85  BUN 30* 22* 25*  CREATININE 4.33* 3.84* 4.31*  CALCIUM  7.1* 7.4* 8.0*  MG 2.1 1.9 2.1  PHOS 4.5 4.3 4.3   GFR: Estimated Creatinine Clearance: 16.3 mL/min (A) (by C-G formula based on SCr of 4.31 mg/dL (H)). Recent Labs  Lab 03/27/24 1328 03/29/24 1230 03/30/24 0846 03/30/24 1454  WBC 23.2* 19.4* 15.1* 16.1*    Liver Function Tests: Recent Labs  Lab 03/27/24 1327 03/29/24 1230 03/30/24 0846  ALBUMIN  1.7* 1.9* 2.6*   No results for  input(s): LIPASE, AMYLASE in the last 168 hours. No results for input(s): AMMONIA in the last 168 hours.  ABG    Component Value Date/Time   PHART 7.384 03/17/2024 0116   PCO2ART 18.7 (LL) 03/17/2024 0116   PO2ART 102 03/17/2024 0116   HCO3 29.6 (H) 03/18/2024 0649   TCO2 31 03/18/2024 0649   ACIDBASEDEF 12.0 (H) 03/17/2024 0116   O2SAT 81 03/18/2024 0649     Coagulation Profile: No results for input(s): INR, PROTIME in the last 168 hours.  Cardiac Enzymes: No results for input(s): CKTOTAL, CKMB, CKMBINDEX, TROPONINI in the last 168 hours.  HbA1C: HbA1c, POC (controlled diabetic range)  Date/Time Value Ref Range Status  11/15/2019 04:40 PM 9.3 (A) 0.0 - 7.0 % Final   Hgb A1c MFr Bld  Date/Time Value Ref Range Status  02/15/2024 11:17 AM 4.3 (L) 4.8 - 5.6 % Final    Comment:    (NOTE) Diagnosis of Diabetes The following HbA1c ranges recommended by the American Diabetes Association (ADA) may be used as an aid in the diagnosis of diabetes mellitus.  Hemoglobin             Suggested A1C NGSP%              Diagnosis  <5.7                   Non Diabetic  5.7-6.4                Pre-Diabetic  >6.4                   Diabetic  <7.0                   Glycemic control for                       adults with diabetes.    02/04/2023 07:55 PM 5.7 (H) 4.8 - 5.6 % Final    Comment:    (NOTE) Pre diabetes:          5.7%-6.4%  Diabetes:              >6.4%  Glycemic control for   <7.0% adults with diabetes  CBG: Recent Labs  Lab 03/31/24 1605 03/31/24 2039 03/31/24 2320 04/01/24 0420 04/01/24 0741  GLUCAP 112* 72 77 115* 112*      Critical care time:       CRITICAL CARE Performed by: Ronnald FORBES Gave   Total critical care time: 80 minutes  Critical care time was exclusive of separately billable procedures and treating other patients.  Critical care was necessary to treat or prevent imminent or life-threatening  deterioration.  Critical care was time spent personally by me on the following activities: development of treatment plan with patient and/or surrogate as well as nursing, discussions with consultants, evaluation of patient's response to treatment, examination of patient, obtaining history from patient or surrogate, ordering and performing treatments and interventions, ordering and review of laboratory studies, ordering and review of radiographic studies, pulse oximetry and re-evaluation of patient's condition.  Ronnald Gave MSN, AGACNP-BC [facility] Amion for pager  04/01/2024, 11:10 AM      "

## 2024-04-01 NOTE — Anesthesia Postprocedure Evaluation (Signed)
"   Anesthesia Post Note  Patient: Deborah [name]  Procedure(s) Performed: INCISION AND DEBRIDEMENT LEFT THIGH (Left: Leg Upper)     Patient location during evaluation: PACU Anesthesia Type: General Level of consciousness: awake Pain management: pain level controlled Vital Signs Assessment: post-procedure vital signs reviewed and stable Respiratory status: spontaneous breathing Cardiovascular status: blood pressure returned to baseline Postop Assessment: no apparent nausea or vomiting Anesthetic complications: no   No notable events documented.                Lauraine DASEN Col[place]oun      "

## 2024-04-01 NOTE — Transfer of Care (Signed)
 Immediate Anesthesia Transfer of Care Note  Patient: Deborah [name]  Procedure(s) Performed: INCISION AND DEBRIDEMENT LEFT THIGH (Left: Leg Upper)  Patient Location: PACU  Anesthesia Type:General  Level of Consciousness: drowsy and patient cooperative  Airway & Oxygen Therapy: Patient Spontanous Breathing  Post-op Assessment: Report given to RN, Post -op Vital signs reviewed and stable, and Patient moving all extremities X 4  Post vital signs: Reviewed and stable  Last Vitals:  Vitals Value Taken Time  BP 124/54   Temp    Pulse 119 04/01/24 15:09  Resp 17 04/01/24 15:09  SpO2 100 % 04/01/24 15:09  Vitals shown include unfiled device data.  Last Pain:  Vitals:   04/01/24 1143  TempSrc: Oral  PainSc:       Patients Stated Pain Goal: 0 (04/01/24 0324)  Complications: No notable events documented.

## 2024-04-01 NOTE — Anesthesia Preprocedure Evaluation (Addendum)
"                                    Anesthesia Evaluation  Patient identified by MRN, date of birth, ID band Patient awake    Reviewed: Allergy  & Precautions, NPO status , Patient's Chart, lab work & pertinent test results  History of Anesthesia Complications Negative for: history of anesthetic complications  Airway Mallampati: III  TM Distance: >3 FB Neck ROM: Full    Dental  (+) Dental Advisory Given, Edentulous Upper, Edentulous Lower   Pulmonary pneumonia (Klebsiella)   breath sounds clear to auscultation       Cardiovascular hypertension, + Peripheral Vascular Disease (s/p BKA) and +CHF  + dysrhythmias (not on [facility]) Atrial Fibrillation  Rhythm:Irregular Rate:Tachycardia   ?Chronic Hypotension per Chart   1. Left ventricular ejection fraction, by estimation, is 60 to 65%. The left ventricle has normal function. There is mild concentric left ventricular hypertrophy. Left ventricular diastolic parameters were normal.   2. Basal and apical function preserved but unable to assess free wall function due to poor visualization. Right ventricular systolic function was not well visualized. The right ventricular size is not well visualized.   3. There is a small pericardial effusion located posteriorly (#21) . a small pericardial effusion is present. The pericardial effusion is posterior to the left ventricle.   4. The mitral valve is normal in structure. No evidence of mitral valve regurgitation. No evidence of mitral stenosis.   5. Calcified nodule in left coronary cusp. The aortic valve is abnormal. Aortic valve regurgitation is not visualized. No aortic stenosis is present.   6. The inferior vena cava is dilated in size with <50% respiratory  variability, suggesting right atrial pressure of 15 mmHg.   Comparison(s): No significant change from prior study. Prior images  reviewed side by side.     Neuro/Psych  PSYCHIATRIC DISORDERS Anxiety Depression    CVA, Residual  Symptoms    GI/Hepatic PUD,GERD  Controlled and Medicated,,(+) Hepatitis -  Endo/Other  diabetes, Type 2    Renal/GU DialysisRenal disease     Musculoskeletal  (+)  Fibromyalgia -  Abdominal   Peds  Hematology  (+) Blood dyscrasia, anemia   Anesthesia Other Findings Sepsis 2/2 ?Necrotizing Fasciitis (on Ertapenem  and Linezolid )   Reproductive/Obstetrics                              Anesthesia Physical Anesthesia Plan  ASA: 4  Anesthesia Plan: General   Post-op Pain Management:    Induction: Intravenous and Rapid sequence  PONV Risk Score and Plan: 3 and Ondansetron , Dexamethasone  and Treatment may vary due to age or medical condition  Airway Management Planned: Oral ETT  Additional Equipment: Arterial line and CVP  Intra-op Plan:   Post-operative Plan: Possible Post-op intubation/ventilation  Informed Consent: I have reviewed the patients History and Physical, chart, labs and discussed the procedure including the risks, benefits and alternatives for the proposed anesthesia with the patient or authorized representative who has indicated his/her understanding and acceptance.     Dental advisory given and Consent reviewed with POA  Plan Discussed with: CRNA  Anesthesia Plan Comments:          Anesthesia Quick Evaluation  "

## 2024-04-01 NOTE — Progress Notes (Signed)
 eLink Physician-Brief Progress Note Patient Name: Deborah [name] DOB: 01-Jul-1965 MRN: [id number]   Date of Service  04/01/2024  HPI/Events of Note  Notified of 10/10 pain over surgical site.  Tylenol  without adequate relief.   eICU Interventions  Placed one time order for dilaudid  0.5mg  IV.  Will follow response.         Ashanti Littles M DELA CRUZ 04/01/2024, 10:22 PM

## 2024-04-01 NOTE — Anesthesia Procedure Notes (Signed)
 Procedure Name: Intubation Date/Time: 04/01/2024 1:33 PM  Performed by: Lamar Lucie DASEN, CRNAPre-anesthesia Checklist: Patient identified, Emergency Drugs available, Suction available and Patient being monitored Patient Re-evaluated:Patient Re-evaluated prior to induction Oxygen Delivery Method: Circle system utilized Preoxygenation: Pre-oxygenation with 100% oxygen Induction Type: IV induction Ventilation: Mask ventilation without difficulty and Oral airway inserted - appropriate to patient size Laryngoscope Size: Mac and 3 Grade View: Grade I Tube type: Oral Tube size: 7.0 mm Number of attempts: 1 Airway Equipment and Method: Stylet and Oral airway Placement Confirmation: ETT inserted through vocal cords under direct vision, positive ETCO2 and breath sounds checked- equal and bilateral Secured at: 21 cm Tube secured with: Tape Dental Injury: Teeth and Oropharynx as per pre-operative assessment

## 2024-04-01 NOTE — Plan of Care (Signed)

## 2024-04-01 NOTE — Op Note (Addendum)
 04/01/2024  3:00 PM  PATIENT:  Deborah [name]  59 y.o. female  Patient Care Team: Pcp, No as PCP - General Shlomo Wilbert SAUNDERS, MD as PCP - Cardiology (Cardiology) Onesimo Emaline Brink, MD as Consulting Physician (Hematology)  PRE-OPERATIVE DIAGNOSIS:  Necrotizing soft tissue infection of left lower extremity  POST-OPERATIVE DIAGNOSIS:  Bullous edema, possible cellulitis of lower extremities, perineum  PROCEDURE: Excisional debridement of left thigh-skin, subcutaneous tissue, 6 x 8 x 2 cm  SURGEON:  Lonni CHRISTELLA. Jawara Latorre, MD  ASSISTANT: OR Staff  ANESTHESIA:   general  COUNTS:  Sponge, needle and instrument counts were reported correct x2 at the conclusion of the operation.  EBL: 5 mL  DRAINS: none  SPECIMEN: none  COMPLICATIONS: none  FINDINGS: Diffuse desquamation of the skin all over her body.  Significant edema/anasarca particularly in the perineum and lower extremities.  The area of fluctuance is identified on the medial left thigh and is fully excised/debrided and we encountered well-perfused skin and healthy appearing subcutaneous fat with edema.  There is no evidence of a necrotizing soft tissue infection.  Similar edema is noted on her right thigh and in the perineum.  There is no fluctuance in these regions.  There is no pustules.  There is no evident abscess.  DISPOSITION: PACU in satisfactory condition   DESCRIPTION:  The patient was seen in the pre-op  holding area. The risks, benefits, complications, treatment options, and expected outcomes were previously discussed with the patient. The patient agreed with the proposed plan and has signed the informed consent form. The patient was brought to the operating room by the surgical team, identified as Deborah [name], and the procedure verified. She is placed supine on the operating table. General anesthesia was induced without difficulty. She was positioned supine in frog leg position.  Pressure points were  evaluated and padded.  Copious amounts of stool was noted in the perineum and vaginal vault.  These were all cleaned.  The lower extremities and perineum were all prepped and draped in the standard sterile fashion using Betadine . She was secured to the operating table. A time out was completed and the above information confirmed and need for preoperative antibiotics.  A foley catheter was then placed by under sterile conditions after further Betadine  cleansing of the vaginal vault returning tea-colored urine.  Attention was directed at the area of most concern which is in the left medial thigh.  There is some fluctuance at this location.  There is some denuded skin.  Of note, she does have significant desquamation of skin throughout her entire body.  This area was first incised and some clear fluid drained.  We then opted to proceed with excisional debridement of this area to assess the quality of the tissue.  An approximately 5 x 8 x 2 cm area of skin and subcutaneous tissue is excised.  We immediately encountered bright red well-perfused skin at the cut edges.  The fat is yellow and well-perfused.  There is no pus or dishwater colored fluid draining from this.  Hemostasis is then achieved with electrocautery.  Multiple areas of well-perfused oozing vessels were treated with cautery.  We then directed our attention at the remainder of her left lower extremity and do not identify any other areas of evident concern.  She does have some faint erythema along her leg but again, this is felt to represent edema/anasarca with potentially a component of mild cellulitis.  This does not appear to be a source of her potential  sepsis.  The contralateral extremity is also evaluated.  No significant areas of fluctuance or concern noted.  The peritoneum was then meticulously inspected.  Although there is edema both labia, there is no evident fluctuance or pustules.  The perineum and lower extremities are also devoid of  crepitus.  Attention then directed our excisional site.  Hemostasis verified.  A dressing consisting of moist Kerlix is placed.  This is then wrapped in dry Kerlix and secured with tape.  All sponge, needle, and instrument counts were reported correct.  She is subsequently awakened from anesthesia with plans to potentially extubate and move onto the recovery room.  Plans underway for admission to the intensive care unit with our critical medicine team.  I was able to update them on our findings at conclusion.   Excisional debridement:   Tool used for debridement  Scalpel  Frequency of surgical debridement.   Once  Measurement of total devitalized tissue (wound surface) before and after surgical debridement.   6 x 8 x 2 cm  Area and depth of devitalized tissue removed from wound.  6 x 8 x 2 cm  Blood loss and description of tissue removed.  5 mL, skin and subcutaneous tissue  Evidence of the progress of the wound's response to treatment.  A.  Current wound volume (current dimensions and depth).  6 x 8 x 2 cm  B.  Presence (and extent of) of infection.  No clear evidence of cutaneous infection  D.  Other material in the wound that is expected to inhibit healing.  N/A

## 2024-04-02 ENCOUNTER — Inpatient Hospital Stay (HOSPITAL_COMMUNITY)

## 2024-04-02 ENCOUNTER — Encounter (HOSPITAL_COMMUNITY): Payer: Self-pay | Admitting: Surgery

## 2024-04-02 DIAGNOSIS — R7881 Bacteremia: Secondary | ICD-10-CM | POA: Diagnosis not present

## 2024-04-02 DIAGNOSIS — M7989 Other specified soft tissue disorders: Secondary | ICD-10-CM | POA: Diagnosis not present

## 2024-04-02 DIAGNOSIS — R55 Syncope and collapse: Secondary | ICD-10-CM | POA: Diagnosis not present

## 2024-04-02 DIAGNOSIS — Z1612 Extended spectrum beta lactamase (ESBL) resistance: Secondary | ICD-10-CM | POA: Diagnosis not present

## 2024-04-02 DIAGNOSIS — L304 Erythema intertrigo: Secondary | ICD-10-CM | POA: Diagnosis not present

## 2024-04-02 DIAGNOSIS — R509 Fever, unspecified: Secondary | ICD-10-CM

## 2024-04-02 LAB — GLUCOSE, CAPILLARY
Glucose-Capillary: 79 mg/dL (ref 70–99)
Glucose-Capillary: 84 mg/dL (ref 70–99)
Glucose-Capillary: 102 mg/dL — ABNORMAL HIGH (ref 70–99)
Glucose-Capillary: 95 mg/dL (ref 70–99)
Glucose-Capillary: 142 mg/dL — ABNORMAL HIGH (ref 70–99)

## 2024-04-02 LAB — BASIC METABOLIC PANEL WITH GFR
Anion gap: 16 — ABNORMAL HIGH (ref 5–15)
BUN: 26 mg/dL — ABNORMAL HIGH (ref 6–20)
CO2: 23 mmol/L (ref 22–32)
Calcium: 8.1 mg/dL — ABNORMAL LOW (ref 8.9–10.3)
Chloride: 100 mmol/L (ref 98–111)
Creatinine, Ser: 4.19 mg/dL — ABNORMAL HIGH (ref 0.44–1.00)
GFR, Estimated: 12 mL/min — ABNORMAL LOW
Glucose, Bld: 99 mg/dL (ref 70–99)
Potassium: 3.8 mmol/L (ref 3.5–5.1)
Sodium: 139 mmol/L (ref 135–145)

## 2024-04-02 LAB — CBC
HCT: 24.9 % — ABNORMAL LOW (ref 36.0–46.0)
Hemoglobin: 8.7 g/dL — ABNORMAL LOW (ref 12.0–15.0)
MCH: 32.3 pg (ref 26.0–34.0)
MCHC: 34.9 g/dL (ref 30.0–36.0)
MCV: 92.6 fL (ref 80.0–100.0)
Platelets: 251 10*3/uL (ref 150–400)
RBC: 2.69 MIL/uL — ABNORMAL LOW (ref 3.87–5.11)
RDW: 18.4 % — ABNORMAL HIGH (ref 11.5–15.5)
WBC: 19 10*3/uL — ABNORMAL HIGH (ref 4.0–10.5)
nRBC: 0 % (ref 0.0–0.2)

## 2024-04-02 LAB — LACTIC ACID, PLASMA: Lactic Acid, Venous: 0.9 mmol/L (ref 0.5–1.9)

## 2024-04-02 LAB — MAGNESIUM: Magnesium: 2 mg/dL (ref 1.7–2.4)

## 2024-04-02 LAB — PHOSPHORUS: Phosphorus: 4.3 mg/dL (ref 2.5–4.6)

## 2024-04-02 MED ORDER — HEPARIN SODIUM (PORCINE) 5000 UNIT/ML IJ SOLN
5000.0000 [IU] | Freq: Three times a day (TID) | INTRAMUSCULAR | Status: AC
  Administered 2024-04-02 – 2024-04-13 (×31): 5000 [IU] via SUBCUTANEOUS
  Filled 2024-04-02 (×31): qty 1

## 2024-04-02 MED ORDER — CALCIUM GLUCONATE-NACL 2-0.675 GM/100ML-% IV SOLN
2.0000 g | Freq: Once | INTRAVENOUS | Status: AC
  Administered 2024-04-02: 2000 mg via INTRAVENOUS
  Filled 2024-04-02: qty 100

## 2024-04-02 MED ORDER — MIDODRINE HCL 5 MG PO TABS
15.0000 mg | ORAL_TABLET | Freq: Three times a day (TID) | ORAL | Status: DC
  Administered 2024-04-02 – 2024-04-13 (×32): 15 mg via ORAL
  Filled 2024-04-02 (×31): qty 3

## 2024-04-02 MED ORDER — VANCOMYCIN HCL 125 MG PO CAPS
125.0000 mg | ORAL_CAPSULE | Freq: Every day | ORAL | Status: AC
  Administered 2024-04-02 – 2024-04-13 (×11): 125 mg via ORAL
  Filled 2024-04-02 (×13): qty 1

## 2024-04-02 MED ORDER — HYDROMORPHONE HCL 1 MG/ML IJ SOLN
1.0000 mg | Freq: Two times a day (BID) | INTRAMUSCULAR | Status: DC | PRN
  Administered 2024-04-02 – 2024-04-07 (×5): 1 mg via INTRAVENOUS
  Filled 2024-04-02 (×5): qty 1

## 2024-04-02 NOTE — Progress Notes (Addendum)
 " PROGRESS NOTE  Deborah [name]  FMW:[id number] DOB: 1965-04-06 DOA: 03/16/2024 PCP: Pcp, No   Brief Narrative: Patient is a 59 year old female with past medical history of paroxysmal A-fib, hyperlipidemia, right BKA, ESRD on dialysis on TTS schedule who presented from skilled nursing facility for further evaluation of syncopal episode with loss of consciousness.  On presentation, she was hypotensive.  Lab work showed creatinine of 7, troponin of 105, lactic acid of 9.  Patient also reported diffuse abdominal pain.  Patient was admitted and was started on broad-spectrum antibiotics.  She was recently hospitalized on last December for sepsis secondary to left thigh abscess and underwent I&D with general surgery on 02/16/2024.  On this admission, she was found to have sepsis secondary to Klebsiella bacteremia and was on meropenem , she underwent multiple sessions of CRRT .  She was transferred to [facility] service on 03/22/2024 but remained hypotensive, patient was also refusing care.  Found to have new progressive bilateral thigh erythema and swelling, concern for necrotizing fasciitis.  General surgery consulted.  Underwent excisional debridement of the left thigh skin, subcutaneous tissue.  Was briefly transferred to ICU for that on 1/25.  Patient transferred back to [facility] service on 1/26.  Major Hospital events:  1/9 ED s/p witnessed syncopal event w/ +LOC in shock, started on Levo/Vaso; Aline/CVC placed 1/10.  CT abdomen pelvis in the ER.  Nonacute.   1/10 CRRT, still on vaso, NE. Esclated to meropenem  for ESBL Klebsiella 1/11 echocardiogram EF 60%.  No acute changes 1/12: off levophed , on vasopressin  1/13: off vasopressin , steroids, and CRRT 1/14: transfer out of ICU, TRH to pick up 1/15 1/23: Transfused units PRBC for hemoglobin of 6.7 1/23: TDC catheter continued by IR 1/25: Remains febrile, with rapidly progressive bilateral groin/thigh/lower abdomen area erythema swelling concerning for  necrotizing fasciitis        Assessment & Plan:  Principal Problem:   Syncope Active Problems:   Atrial fibrillation with RVR (HCC)-resolved   Hypotension   Bacteremia due to Klebsiella pneumoniae   Encephalopathy acute   Septic shock (HCC)   Encounter for continuous renal replacement therapy (CRRT) for acute renal failure   ESRD (end stage renal disease) (HCC)   Non-healing open wound of left groin   Sepsis due to Klebsiella (HCC)   ESBL (extended spectrum beta-lactamase) producing bacteria infection   Hemodialysis catheter infection   Bullous rash   Exfoliative dermatitis   Bacteremia   Intertrigo  Suspected necrotizing fasciitis/cellulitis of left lower extremity: Developed rapidly progressive lower extremity edema.  There was concern for necrotizing fasciitis.  General surgery consulted.  Underwent excisional debridement of the left thigh on 1/26. Currently on linezolid , ertapenem , fluconazole  Looks like wound culture not sent while she underwent debridement. She also had a history of left groin abscess on last admission and underwent I&D by Dr. Vernetta on 02/16/2024.  At that time CT abdomen/pelvis did not show any acute findings.  She was also seen by orthopedics, Dr. Harden on 1/15  Septic shock secondary to Klebsiella pneumonia bacteremia: Source of infection was unclear.  Has leukocytosis, intermittent fever.  Currently on ertapenem , linezolid .  Very difficult stick, repeat blood cultures could not be sent.  The last 1 was done from her HD catheter.  ID following  ESRD on dialysis: Nephrology following.  Dialyzed on TTS schedule, was being dialyzed with temporary dialysis catheter but apparently it is not working.  IR has been consulted  Chronic hypotension: Required vasopressor therapy, currently on midodrine .  Currently  off vasopressors.  Also on albumin .  Blood pressure stable this morning  Anemia of chronic disease: Likely secondary to ESRD.  Given blood transfusion  here.  Currently hemoglobin is stable  Opioid use disorder/substance abuse/history of fibromyalgia: Follows outpatient with [facility] pain clinic.  Minimize narcotics  History of C. difficile: C. difficile negative. On Imodium  as needed if diarrhoea  Syncope: Echo showed EF of 60 to 65%, mild LVH, a small pericardial effusion posteriorly.  Troponin elevation/paroxysmal A-fib: Not on anticoagulant due to history of GI bleed.  Troponin elevation is secondary to demand ischemia.  Echo showed preserved EF no wall motion abnormality, no chest pain. She was seen by cardiology for transient RVR and was started on amiodarone  drip on 03/23/2024. Cardiology following.On oral amiodarone   Hyperlipidemia: On statin  History of duodenal carcinoid tumor s/p resection and lung monitor/gastric ulcer/gastritis/chronic gastroparesis: On H2 blocker,  Scattered Wounds: Had wound on the left thigh, right hip, lower abdomen, buttocks, left heel.  Wound care consulted.  Dr. Harden was consulted to evaluate left thigh postopsite  History of peripheral artery disease: Status post right BKA.  Stump is well-healed  Possible vaginal yeast infection: On weekly Diflucan   Anxiety/depression: On BuSpar , risperidone , trazodone  at home  Diabetes type 2: On sliding scale now.  A1c 4.3 as per 02/15/2024.  Will DC insulin   Debility/deconditioning: Patient is from SNF.  Plan for discharge to SNF when appropriate.  Long length of stay       Nutrition Problem: Moderate Malnutrition Etiology: acute illness Wound 02/19/24 0200 Pressure Injury Foot Left;Lateral Unstageable - Full thickness tissue loss in which the base of the injury is covered by slough (yellow, tan, gray, green or brown) and/or eschar (tan, brown or black) in the wound bed. (Active)     Wound 03/17/24 0045 Pressure Injury Thigh Left;Posterior Stage 2 -  Partial thickness loss of dermis presenting as a shallow open injury with a red, pink wound bed without  slough. (Active)     Wound 03/17/24 0045 Pressure Injury Hip Lateral;Right Stage 2 -  Partial thickness loss of dermis presenting as a shallow open injury with a red, pink wound bed without slough. (Active)     Wound 03/17/24 0045 Pressure Injury Abdomen Lower;Left Stage 2 -  Partial thickness loss of dermis presenting as a shallow open injury with a red, pink wound bed without slough. (Active)     Wound 03/17/24 1119 Pressure Injury Buttocks Right;Left;Medial Stage 3 -  Full thickness tissue loss. Subcutaneous fat may be visible but bone, tendon or muscle are NOT exposed. (Active)    DVT prophylaxis:Place and maintain sequential compression device Start: 03/20/24 1800     Code Status: Full Code  Family Communication: None at the bedside  Patient status: Inpatient inpatient  Patient is from : SNF  Anticipated discharge to: SNF  Estimated DC date: Not sure   Consultants: Nephrology, critical care, general surgery, cardiology, palliative care  Procedures: Dialysis, I&D  Antimicrobials:  Anti-infectives (From admission, onward)    Start     Dose/Rate Route Frequency Ordered Stop   04/22/24 1000  vancomycin  (VANCOCIN ) 50 mg/mL oral solution SOLN 125 mg  Status:  Discontinued       Placed in Followed by Linked Group   125 mg Per Tube Every 3 DAYS 03/17/24 1020 03/17/24 1332   04/14/24 1000  vancomycin  (VANCOCIN ) 50 mg/mL oral solution SOLN 125 mg  Status:  Discontinued       Placed in Followed by Linked Group  125 mg Per Tube Every other day 03/17/24 1020 03/17/24 1332   04/07/24 1000  vancomycin  (VANCOCIN ) 50 mg/mL oral solution SOLN 125 mg  Status:  Discontinued       Placed in Followed by Linked Group   125 mg Per Tube Daily 03/17/24 1020 03/17/24 1332   04/01/24 1130  linezolid  (ZYVOX ) IVPB 600 mg        600 mg 300 mL/hr over 60 Minutes Intravenous Every 12 hours 04/01/24 1055     04/01/24 1030  vancomycin  (VANCOREADY) IVPB 1500 mg/300 mL  Status:  Discontinued         1,500 mg 150 mL/hr over 120 Minutes Intravenous  Once 04/01/24 0953 04/01/24 2252   03/31/24 1000  vancomycin  (VANCOCIN ) 50 mg/mL oral solution SOLN 125 mg  Status:  Discontinued       Placed in Followed by Linked Group   125 mg Per Tube 2 times daily 03/17/24 1020 03/17/24 1332   03/30/24 2200  ertapenem  (INVANZ ) 500 mg in sodium chloride  0.9 % 50 mL IVPB        500 mg 100 mL/hr over 30 Minutes Intravenous Every 24 hours 03/30/24 1520     03/28/24 1800  fluconazole  (DIFLUCAN ) tablet 200 mg        200 mg Oral Weekly 03/28/24 1206     03/21/24 2200  meropenem  (MERREM ) 1 g in sodium chloride  0.9 % 100 mL IVPB  Status:  Discontinued        1 g 200 mL/hr over 30 Minutes Intravenous Every 24 hours 03/20/24 1041 03/20/24 1153   03/20/24 2200  meropenem  (MERREM ) 1 g in sodium chloride  0.9 % 100 mL IVPB  Status:  Discontinued        1 g 200 mL/hr over 30 Minutes Intravenous Every 24 hours 03/20/24 1153 03/30/24 1519   03/19/24 1200  vancomycin  (VANCOCIN ) IVPB 1000 mg/200 mL premix  Status:  Discontinued        1,000 mg 200 mL/hr over 60 Minutes Intravenous  Once 03/19/24 0907 03/19/24 0907   03/19/24 1200  vancomycin  (VANCOCIN ) IVPB 1000 mg/200 mL premix  Status:  Discontinued        1,000 mg 200 mL/hr over 60 Minutes Intravenous Every 24 hours 03/19/24 0907 03/19/24 1110   03/17/24 2200  ceFEPIme  (MAXIPIME ) 2 g in sodium chloride  0.9 % 100 mL IVPB  Status:  Discontinued        2 g 200 mL/hr over 30 Minutes Intravenous Every 24 hours 03/17/24 0142 03/17/24 0732   03/17/24 1445  meropenem  (MERREM ) 1 g in sodium chloride  0.9 % 100 mL IVPB  Status:  Discontinued        1 g 200 mL/hr over 30 Minutes Intravenous Every 8 hours 03/17/24 1354 03/20/24 1153   03/17/24 1445  vancomycin  (VANCOCIN ) 50 mg/mL oral solution SOLN 125 mg       Placed in Or Linked Group   125 mg Per Tube Daily 03/17/24 1354 03/31/24 2359   03/17/24 1445  vancomycin  (VANCOCIN ) capsule 125 mg       Placed in Or Linked  Group   125 mg Oral Daily 03/17/24 1354 03/31/24 2359   03/17/24 1445  vancomycin  (VANCOCIN ) IVPB 1000 mg/200 mL premix  Status:  Discontinued        1,000 mg 200 mL/hr over 60 Minutes Intravenous Every 24 hours 03/17/24 1354 03/18/24 0923   03/17/24 1430  fidaxomicin  (DIFICID ) tablet 200 mg  Status:  Discontinued  200 mg Tube 2 times daily 03/17/24 1332 03/17/24 1351   03/17/24 1115  vancomycin  (VANCOCIN ) capsule 125 mg  Status:  Discontinued        125 mg Oral 4 times daily 03/17/24 1020 03/17/24 1354   03/17/24 1115  vancomycin  (VANCOCIN ) 50 mg/mL oral solution SOLN 125 mg  Status:  Discontinued       Placed in Followed by Linked Group   125 mg Per Tube 4 times daily 03/17/24 1020 03/17/24 1332   03/17/24 1000  meropenem  (MERREM ) 500 mg in sodium chloride  0.9 % 100 mL IVPB  Status:  Discontinued        500 mg 200 mL/hr over 30 Minutes Intravenous Every 24 hours 03/17/24 0732 03/17/24 1354   03/17/24 0145  metroNIDAZOLE  (FLAGYL ) IVPB 500 mg  Status:  Discontinued        500 mg 100 mL/hr over 60 Minutes Intravenous 2 times daily 03/17/24 0140 03/17/24 0732   03/16/24 2114  vancomycin  variable dose per unstable renal function (pharmacist dosing)  Status:  Discontinued         Does not apply See admin instructions 03/16/24 2114 03/17/24 1354   03/16/24 1945  ceFEPIme  (MAXIPIME ) 2 g in sodium chloride  0.9 % 100 mL IVPB        2 g 200 mL/hr over 30 Minutes Intravenous  Once 03/16/24 1934 03/16/24 2034   03/16/24 1930  vancomycin  (VANCOREADY) IVPB 2000 mg/400 mL        2,000 mg 200 mL/hr over 120 Minutes Intravenous  Once 03/16/24 1918 03/16/24 2247   03/16/24 1930  piperacillin -tazobactam (ZOSYN ) IVPB 4.5 g  Status:  Discontinued        4.5 g 200 mL/hr over 30 Minutes Intravenous  Once 03/16/24 1918 03/16/24 1934       Subjective: Patient seen and examined at bedside today.  Lying on bed.  Hemodynamically stable.  On room air.  Alert and awake, slightly confused but overall  oriented.  Denies any new complaints.  EKG monitor showed normal sinus rhythm.  Very deconditioned and chronically ill looking but looks comfortable today.   Objective: Vitals:   04/02/24 0400 04/02/24 0500 04/02/24 0600 04/02/24 0700  BP: 115/61     Pulse: 82 87 86 83  Resp: (!) 25 (!) 26 (!) 31 (!) 30  Temp: 100 F (37.8 C) 100 F (37.8 C) 100 F (37.8 C) 100.2 F (37.9 C)  TempSrc: Bladder     SpO2: 99% 99% 99% 97%  Weight:      Height:        Intake/Output Summary (Last 24 hours) at 04/02/2024 0734 Last data filed at 04/02/2024 0700 Gross per 24 hour  Intake 1790.23 ml  Output 60 ml  Net 1730.23 ml   Filed Weights   03/30/24 0848 03/30/24 1250 04/01/24 0500  Weight: (S) 105.4 kg (S) 104.2 kg 96.5 kg    Examination:  General exam: Overall comfortable, not in distress, chronically deconditioned/ill looking, lying on bed HEENT: PERRL, central line on the right neck Respiratory system:  no wheezes or crackles, diminished sounds at bases, on room air Cardiovascular system: S1 & S2 heard, RRR.  Gastrointestinal system: Abdomen is nondistended, soft and nontender. Central nervous system: Alert and awake, oriented to place, knows correct year Extremities: BKA, dressing on the left thigh, ulcer on the left ankle and heel Skin: No rashes   Data Reviewed: I have personally reviewed following labs and imaging studies  CBC: Recent Labs  Lab 03/27/24  1328 03/29/24 1230 03/30/24 0846 03/30/24 1454 04/01/24 1401 04/01/24 1902 04/02/24 0548  WBC 23.2* 19.4* 15.1* 16.1*  --  20.7* 19.0*  NEUTROABS 19.1*  --  11.9*  --   --  19.4*  --   HGB 8.1* 8.1* 6.7* 9.7* 10.2* 8.9* 8.7*  HCT 24.9* 25.7* 20.4* 28.3* 30.0* 27.0* 24.9*  MCV 97.6 100.8* 98.6 92.5  --  96.1 92.6  PLT 370 390 337 324  --  259 251   Basic Metabolic Panel: Recent Labs  Lab 03/27/24 1327 03/29/24 1230 03/30/24 0846 04/01/24 1401 04/01/24 1902 04/02/24 0548  NA 139 138 140 140 140 139  K 3.7 3.8  3.5 3.4* 3.7 3.8  CL 105 102 106  --  102 100  CO2 24 24 23   --  24 23  GLUCOSE 162* 163* 85  --  117* 99  BUN 30* 22* 25*  --  23* 26*  CREATININE 4.33* 3.84* 4.31*  --  4.09* 4.19*  CALCIUM  7.1* 7.4* 8.0*  --  7.9* 8.1*  MG 2.1 1.9 2.1  --  1.8 2.0  PHOS 4.5 4.3 4.3  --  3.8 4.3     No results found for this or any previous visit (from the past 240 hours).   Radiology Studies: DG Chest Port 1 View Result Date: 04/01/2024 EXAM: 1 VIEW(S) XRAY OF THE CHEST 04/01/2024 03:42:00 PM COMPARISON: 04/01/2024 CLINICAL HISTORY: Encounter for central line placement. FINDINGS: LINES, TUBES AND DEVICES: Right IJ central line in place. Patient extubated. LUNGS AND PLEURA: Persistent low lung volumes. No focal pulmonary opacity. No pleural effusion. No pneumothorax. HEART AND MEDIASTINUM: No acute abnormality of the cardiac and mediastinal silhouettes. BONES AND SOFT TISSUES: Surgical clips in the right upper quadrant. Degenerative changes in the spine and shoulders. No acute osseous abnormality. IMPRESSION: 1. Right IJ central line in place. 2. No acute findings. Electronically signed by: Elsie Gravely MD 04/01/2024 03:48 PM EST RP Workstation: [id number]   DG Chest Port 1 View Result Date: 04/01/2024 EXAM: 1 VIEW XRAY OF THE CHEST 04/01/2024 02:19:00 PM COMPARISON: 03/31/2024 CLINICAL HISTORY: Encounter for central line placement. ICD10: 747705 Encounter for central line placement. FINDINGS: LINES, TUBES AND DEVICES: Endotracheal tube in place with tip 2.8 cm above the carina. Right IJ introducer sheath is noted with associated catheter with abrupt angulation over the upper SVC region, possibly extending in the azygos vein although not entirely specific. LUNGS AND PLEURA: Low lung volume. Mild left mid and lower lung indistinct airspace opacity. Excluded right costophrenic angle. No pleural effusion. No pneumothorax. HEART AND MEDIASTINUM: No acute abnormality of the cardiac and mediastinal silhouettes.  BONES AND SOFT TISSUES: Surgical clips in right upper quadrant, consistent with prior cholecystectomy. No acute osseous abnormality. IMPRESSION: 1. Right IJ introducer and associated catheter with abrupt angulation over the upper SVC region, possibly extending in the azygos vein. 2. Endotracheal tube tip 2.8 cm above the carina. 3. Mild left mid and lower lung indistinct airspace opacity. 4. Prior cholecystectomy. Electronically signed by: Ryan Salvage MD 04/01/2024 02:30 PM EST RP Workstation: [id number]    Scheduled Meds:  sodium chloride    Intravenous Once   sodium chloride    Intravenous Once   sodium chloride    Intravenous Once   amiodarone   200 mg Oral Daily   ascorbic acid   500 mg Oral BID   atorvastatin   10 mg Oral Daily   busPIRone   5 mg Oral TID   collagenase    Topical Daily   darbepoetin (ARANESP ) injection -  DIALYSIS  150 mcg Subcutaneous Q Tue-1800   famotidine   20 mg Oral QHS   feeding supplement  1 Container Oral Q24H   fluconazole   200 mg Oral Weekly   heparin  sodium (porcine)  1.9 mL Intracatheter Once   insulin  aspart  0-5 Units Subcutaneous QHS   insulin  aspart  0-9 Units Subcutaneous TID WC   midodrine   20 mg Oral Q8H   multivitamin  1 tablet Oral QHS   mouth rinse  15 mL Mouth Rinse 4 times per day   risperiDONE   1 mg Oral QHS   sodium chloride  flush  10-40 mL Intracatheter Q12H   Continuous Infusions:  albumin  human Stopped (04/02/24 0600)   ertapenem  Stopped (04/02/24 0013)   linezolid  (ZYVOX ) IV Stopped (04/01/24 2335)     LOS: 17 days   Ivonne Mustache, MD [facility] P1/26/2026, 7:34 AM  "

## 2024-04-02 NOTE — Progress Notes (Signed)
 Patient currently refusing repositioning and wound care/surgical site assessment.  RN explained to patient the reasoning for repositioning to prevent pressure injuries.  RN also explained the reasons why surgical incision site needs to be assessed and care provided to promote healing and prevent infection.  RN asked patient to explain her concerns and reasons for refusal but patient declined to elaborate.  MD [facility] and Palliative PA [place] notified.

## 2024-04-02 NOTE — Progress Notes (Signed)
 "        [facility]  Date of Admission:  03/16/2024      Total days of antibiotics 17  Ertapenem   1/25  Linezolid  1/25          ASSESSMENT: Deborah [name] is a 59 y.o. female admitted from home with:   ESBL Producing Klebsiella Pneumoniae Bacteremia - BCx + 1/09. Unable to repeat with extremely difficult IV / venipuncture access.  HD line removed on 1/23 with temporary CVC in place as of 1/25 due to urgent access for surgery needed.  - would continue the ertapenem  2 weeks from line removal on 1/23.   Necrotizing Perineum SSTI -  Taken to surgery on 1/25 after rapid change in skin noted with blistering and erythema.  Gen surgery following. Operative notes reviewed. No new cultures.  - continue linezolid  2 weeks from surgery 1/25  - follow for any plans to take back to OR.   LLE Swelling with soft tissue deformity -  Noted under her dressing - Gen surgery ordered LLE scan to evaluate. She is tender all over with the same painful response no matter the stimulus so hard to gauge.  - CT LLE ordered and pending.   Intertrigo - WOC recommended interdryAg sheets. We have her on weekly diflucan  to help manage though on exam today her armpits remain quite moist. Would benefit from these sheets in axilla folds.   CDiff Prophylaxis -  Continue 125 mg PO vancomycin  throughout antibiotic course and for an additional week.    PLAN: Will need to notify nephrology when anticipated d/c is 48 hours away so the HD center can order ertapenem  to finish out treatment.  EOT for the ertapenem  04/13/2024 EOT for the linezolid  PO 04/15/2024 Weekly Diflucan  200 mg x 4 doses  Interdry Ag to folds as WOC team outlines.     Principal Problem:   Syncope Active Problems:   Atrial fibrillation with RVR (HCC)-resolved   Hypotension   Bacteremia due to Klebsiella pneumoniae   Encephalopathy acute   Septic shock (HCC)   Encounter for continuous renal replacement  therapy (CRRT) for acute renal failure   ESRD (end stage renal disease) (HCC)   Non-healing open wound of left groin   Sepsis due to Klebsiella (HCC)   ESBL (extended spectrum beta-lactamase) producing bacteria infection   Hemodialysis catheter infection   Bullous rash   Exfoliative dermatitis   Bacteremia   Intertrigo    amiodarone   200 mg Oral Daily   ascorbic acid   500 mg Oral BID   atorvastatin   10 mg Oral Daily   busPIRone   5 mg Oral TID   collagenase    Topical Daily   darbepoetin (ARANESP ) injection - DIALYSIS  150 mcg Subcutaneous Q Tue-1800   famotidine   20 mg Oral QHS   feeding supplement  1 Container Oral Q24H   fluconazole   200 mg Oral Weekly   heparin  injection (subcutaneous)  5,000 Units Subcutaneous Q8H   heparin  sodium (porcine)  1.9 mL Intracatheter Once   midodrine   15 mg Oral Q8H   multivitamin  1 tablet Oral QHS   mouth rinse  15 mL Mouth Rinse 4 times per day   risperiDONE   1 mg Oral QHS   sodium chloride  flush  10-40 mL Intracatheter Q12H   vancomycin   125 mg Oral Daily    SUBJECTIVE: Hurts all over  Feeling better since surgery to address SSTI    Review of Systems: Review of Systems  Constitutional:  Positive for fever and malaise/fatigue. Negative for chills and diaphoresis.  Gastrointestinal:  Negative for abdominal pain, diarrhea, nausea and vomiting.  Musculoskeletal:  Negative for back pain and myalgias.  Skin:  Positive for itching and rash.     Allergies[1]  OBJECTIVE: Vitals:   04/02/24 0900 04/02/24 1000 04/02/24 1100 04/02/24 1200  BP:    120/60  Pulse: 88 85 84 91  Resp: (!) 32 (!) 27 (!) 28 16  Temp: (!) 100.4 F (38 C) (!) 100.4 F (38 C) (!) 100.6 F (38.1 C) 98.3 F (36.8 C)  TempSrc:    Oral  SpO2: 98% 99% 99% 100%  Weight:      Height:       Body mass index is 34.34 kg/m.  Physical Exam Constitutional:      Appearance: She is ill-appearing.  HENT:     Mouth/Throat:     Mouth: Mucous membranes are dry.      Pharynx: Oropharynx is clear.  Eyes:     Conjunctiva/sclera: Conjunctivae normal.     Pupils: Pupils are equal, round, and reactive to light.  Cardiovascular:     Rate and Rhythm: Normal rate.  Abdominal:     General: Bowel sounds are normal. There is no distension.     Palpations: Abdomen is soft.  Musculoskeletal:     Cervical back: Normal range of motion. No rigidity.     Right lower leg: Edema present.     Left lower leg: Edema present.  Skin:    General: Skin is warm.     Capillary Refill: Capillary refill takes less than 2 seconds.     Findings: Rash present.  Neurological:     Mental Status: She is alert and oriented to person, place, and time.     Lab Results Lab Results  Component Value Date   WBC 19.0 (H) 04/02/2024   HGB 8.7 (L) 04/02/2024   HCT 24.9 (L) 04/02/2024   MCV 92.6 04/02/2024   PLT 251 04/02/2024    Lab Results  Component Value Date   CREATININE 4.19 (H) 04/02/2024   BUN 26 (H) 04/02/2024   NA 139 04/02/2024   K 3.8 04/02/2024   CL 100 04/02/2024   CO2 23 04/02/2024    Lab Results  Component Value Date   ALT 6 04/01/2024   AST 14 (L) 04/01/2024   ALKPHOS 61 04/01/2024   BILITOT 0.9 04/01/2024     Microbiology: No results found for this or any previous visit (from the past 240 hours).  Corean Fireman, MSN, NP-C [facility]  [email or web address] Pager: [phone] Office: [phone] [facility] Main Line: [phone] *Secure Chat Communication Welcome      [1]  Allergies Allergen Reactions   Dorethia Burns ] Other (See Comments)    Hx of stomach ulcer   Bentyl  [Dicyclomine ] Other (See Comments)    Chest pain   Gabapentin  Hives and [name]   Isovue  [Iopamidol ] Anaphylaxis, [name] and Other (See Comments)    11/28/17 Patient had seizure like activity and then 1 min code after 100 cc of isovue  300. Possible contrast allergy  vs vasovagal  episode  Cardiac Arrest   Nsaids Anaphylaxis and Other (See Comments)    Hx of stomach ulcers   Penicillins Itching, Palpitations and Other (See Comments)    Flushing (Red Skin) Laryngeal Edema   Reglan  [Metoclopramide ] Other (See Comments)    Tardive dyskinesia    Valium  [Diazepam ] Shortness Of  Breath   Zestril [Lisinopril] Anaphylaxis and Swelling    Tongue and mouth swelling Laryngeal Edema   Tolectin [Tolmetin] Nausea And Vomiting, Nausea Only and Other (See Comments)    Irritates stomach ulcer   Aspartame And Phenylalanine Hives   Flexeril  [Cyclobenzaprine ] Palpitations   Hibiclens  [Chlorhexidine  Gluconate] Dermatitis   Oxycontin  [Oxycodone ] Palpitations   Rifamycins Palpitations   Tylenol  [Acetaminophen ] Nausea And Vomiting, Nausea Only and Other (See Comments)    Irritates stomach ulcer Abdominal pain   Ultram  [Tramadol ] Nausea And Vomiting and Palpitations   "

## 2024-04-02 NOTE — Progress Notes (Signed)
 "                                                                                                                                                         Daily Progress Note   Patient Name: Deborah [name]       Date: 04/02/2024 DOB: 1966/02/26  Age: 59 y.o. MRN#: [id number] Attending Physician: Sharie Bourbon, MD Primary Care Physician: Pcp, No Admit Date: 03/16/2024  Reason for Consultation/Follow-up: Establishing goals of care  Subjective: Patient is lethargic, awakens easily and greets me then returns to sleep. Her mother Therisa is present visiting. Discussed with RN.   Created space and opportunity for patient and family's thoughts and feelings on patient's current illness. Therisa shared her experience as a education officer, environmental and the impact this has on her maternal role during this period of challenge for Amair[place]. She is grateful that she can convince Eriyonna to agree to receive care on the days that she is here. She acknowledges the importance of checking in with Quaneshia and supporting her overall goals and wishes.   We discussed that it has been difficult for patient to make decisions about her care, such as whether she is done with receiving life-prolonging interventions or not, or whether her QOL is tolerable or not. Reviewed that Jacquelinne's choice to defer certain aspects of her care is a decision in its own way, decreasing likelihood of life-prolonging interventions being successful.  Therisa shares her understanding and her continued hope for a positive outcome.  Emotional support and therapeutic listening was provided.  Questions and concerns addressed. PMT will continue to support holistically.  Objective:  Medical records reviewed including progress notes and labs.    Physical Exam Vitals and nursing note reviewed.  Constitutional:      General: She is not in acute distress.    Appearance: She is ill-appearing.     Interventions: Nasal cannula in place.  HENT:     Head:  Normocephalic and atraumatic.  Cardiovascular:     Rate and Rhythm: Normal rate.  Pulmonary:     Effort: Pulmonary effort is normal. No respiratory distress.  Skin:    General: Skin is dry.     Findings: Rash present.  Neurological:     Mental Status: She is lethargic.            Vital Signs: BP 123/61   Pulse 83   Temp (!) 100.4 F (38 C)   Resp (!) 25   Ht 5' 6 (1.676 m)   Wt 96.5 kg   LMP 10/10/2012   SpO2 98%   BMI 34.34 kg/m  SpO2: SpO2: 98 % O2 Device: O2 Device: Room Air O2 Flow Rate: O2 Flow Rate (L/min): 1 L/min      Palliative Assessment/Data: 30%   Palliative Care Assessment & Plan  Patient Profile: 59 y.o. female  with past medical history of PAF, right BKA, end-stage renal disease on dialysis, chronic pain in her abdomen and bilateral legs admitted on 03/16/2024 with syncopal episode and loss of consciousness at [facility].    In the ED, patient was found to have metabolic acidosis and lactic acid of 9, SIRS criteria, ultimately admitted for septic shock requiring pressors.  She was also recently hospitalized in December 2025 for sepsis secondary to left thigh abscess.  Chart review also shows that she was discharged from physical medicine and rehabilitation clinic for her pain management in January 2025 due to positive drug screen via oral swab.  Patient reports she is now seen by [facility] pain clinic.  Refused HD today due to pain.   PMT has been consulted to assist with goals of care conversation.  She was last seen by PMT for goals of care discussion in December 2024.  Assessment: Goals of care conversation End-stage renal disease on dialysis Persistent bacteremia left thigh soft tissue infection  Recommendations/Plan: Continue full code/full scope treatment  Ongoing GOC discussions Psychosocial and emotional support provided PMT will continue to follow and support   Prognosis: Poor  Discharge Planning: To Be Determined  Care plan was discussed with  patient, patient's mother, RN   Brealynn Contino SHAUNNA Fell, PA-C  Palliative Medicine Team Team phone # [phone]  Thank you for allowing the Palliative Medicine Team to assist in the care of this patient. Please utilize secure chat with additional questions, if there is no response within 30 minutes please call the above phone number.  Palliative Medicine Team providers are available by phone from 7am to 7pm daily and can be reached through the team cell phone.  Should this patient require assistance outside of these hours, please call the patient's attending physician.    I personally spent a total of 35 minutes in the care of the patient today including preparing to see the patient, getting/reviewing separately obtained history, performing a medically appropriate exam/evaluation, counseling and educating, referring and communicating with other health care professionals, and documenting clinical information in the EHR.  "

## 2024-04-02 NOTE — Consult Note (Signed)
" °  CLINICAL SUPPORT TEAM - WOUND OSTOMY AND CONTINENCE TEAM  CONSULTATION SERVICES   WOC Nurse-Inpatient Note   WOC Nurse Consult Note: Reason for Consult: Consult requested for maceration and moisture to inner groin and upper inner thighs and abd skin folds.  Performed remotely after review of progress notes and photos in the EMR.  Appearance is consistent with Intertrigo with partial thickness skin loss related to moisture associated skin damage.     Dressing procedure/placement/frequency: Topical treatment orders provided for bedside nurses to perform using Interdry; silver-impregnated fabric ordered for use by bedside nurses and instructions provided. This product should remain in place for 5 days for optimal plan of care to provide antimicrobial benefits and wick moisture away from skin.  [phone])  Please re-consult if further assistance is needed.  Thank-you,  Stephane Fought MSN, RN, CWOCN, CWCN-AP, CNS Contact Mon-Fri 0700-1500: [phone]      "

## 2024-04-02 NOTE — Progress Notes (Signed)
 Patient refused all repositioning and oral care throughout the night shift despite repeated attempts.

## 2024-04-02 NOTE — Progress Notes (Signed)
 " [facility] Progress Note   Subjective:   Seen in room, appears tired but awakens to voice. Denies SOB, CP, dizziness, nausea, leg pain.   Objective Vitals:   04/02/24 0600 04/02/24 0700 04/02/24 0800 04/02/24 0900  BP:   123/61   Pulse: 86 83 83 88  Resp: (!) 31 (!) 30 (!) 25 (!) 32  Temp: 100 F (37.8 C) 100.2 F (37.9 C) (!) 100.4 F (38 C) (!) 100.4 F (38 C)  TempSrc:   Bladder   SpO2: 99% 97% 98% 98%  Weight:      Height:       Physical Exam General: chronically ill appearing female in NAD Heart: RRR, no murmur Lungs: CTA bilaterally, respirations unlabored Abdomen: Soft, non-distended, +BS Extremities: 1-2+ edema bilateral lower extremities Dialysis Access: none  Additional Objective Labs: Basic Metabolic Panel: Recent Labs  Lab 03/30/24 0846 04/01/24 1401 04/01/24 1902 04/02/24 0548  NA 140 140 140 139  K 3.5 3.4* 3.7 3.8  CL 106  --  102 100  CO2 23  --  24 23  GLUCOSE 85  --  117* 99  BUN 25*  --  23* 26*  CREATININE 4.31*  --  4.09* 4.19*  CALCIUM  8.0*  --  7.9* 8.1*  PHOS 4.3  --  3.8 4.3   Liver Function Tests: Recent Labs  Lab 03/29/24 1230 03/30/24 0846 04/01/24 1902  AST  --   --  14*  ALT  --   --  6  ALKPHOS  --   --  61  BILITOT  --   --  0.9  PROT  --   --  4.9*  ALBUMIN  1.9* 2.6* 3.4*   Recent Labs  Lab 04/01/24 1902  LIPASE <10*  AMYLASE 20*   CBC: Recent Labs  Lab 03/27/24 1328 03/29/24 1230 03/30/24 0846 03/30/24 1454 04/01/24 1401 04/01/24 1902 04/02/24 0548  WBC 23.2* 19.4* 15.1* 16.1*  --  20.7* 19.0*  NEUTROABS 19.1*  --  11.9*  --   --  19.4*  --   HGB 8.1* 8.1* 6.7* 9.7* 10.2* 8.9* 8.7*  HCT 24.9* 25.7* 20.4* 28.3* 30.0* 27.0* 24.9*  MCV 97.6 100.8* 98.6 92.5  --  96.1 92.6  PLT 370 390 337 324  --  259 251   Blood Culture    Component Value Date/Time   SDES BLOOD RIGHT ANTECUBITAL 03/16/2024 1949   SPECREQUEST  03/16/2024 1949    BOTTLES DRAWN AEROBIC AND ANAEROBIC Blood Culture  results may not be optimal due to an inadequate volume of blood received in culture bottles   CULT (A) 03/16/2024 1949    KLEBSIELLA PNEUMONIAE Confirmed Extended Spectrum Beta-Lactamase Producer (ESBL).  In bloodstream infections from ESBL organisms, carbapenems are preferred over piperacillin /tazobactam. They are shown to have a lower risk of mortality.    REPTSTATUS 03/19/2024 FINAL 03/16/2024 1949    Cardiac Enzymes: No results for input(s): CKTOTAL, CKMB, CKMBINDEX, TROPONINI in the last 168 hours. CBG: Recent Labs  Lab 04/01/24 0420 04/01/24 0741 04/01/24 1146 04/01/24 2208 04/02/24 0913  GLUCAP 115* 112* 79 115* 84   Iron  Studies: No results for input(s): IRON , TIBC, TRANSFERRIN, FERRITIN in the last 72 hours. @lablastinr3 @ Studies/Results: DG Chest Port 1 View Result Date: 04/01/2024 EXAM: 1 VIEW(S) XRAY OF THE CHEST 04/01/2024 03:42:00 PM COMPARISON: 04/01/2024 CLINICAL HISTORY: Encounter for central line placement. FINDINGS: LINES, TUBES AND DEVICES: Right IJ central line in place. Patient extubated. LUNGS AND PLEURA: Persistent low lung volumes. No  focal pulmonary opacity. No pleural effusion. No pneumothorax. HEART AND MEDIASTINUM: No acute abnormality of the cardiac and mediastinal silhouettes. BONES AND SOFT TISSUES: Surgical clips in the right upper quadrant. Degenerative changes in the spine and shoulders. No acute osseous abnormality. IMPRESSION: 1. Right IJ central line in place. 2. No acute findings. Electronically signed by: Elsie Gravely MD 04/01/2024 03:48 PM EST RP Workstation: [id number]   DG Chest Port 1 View Result Date: 04/01/2024 EXAM: 1 VIEW XRAY OF THE CHEST 04/01/2024 02:19:00 PM COMPARISON: 03/31/2024 CLINICAL HISTORY: Encounter for central line placement. ICD10: 747705 Encounter for central line placement. FINDINGS: LINES, TUBES AND DEVICES: Endotracheal tube in place with tip 2.8 cm above the carina. Right IJ introducer sheath is noted  with associated catheter with abrupt angulation over the upper SVC region, possibly extending in the azygos vein although not entirely specific. LUNGS AND PLEURA: Low lung volume. Mild left mid and lower lung indistinct airspace opacity. Excluded right costophrenic angle. No pleural effusion. No pneumothorax. HEART AND MEDIASTINUM: No acute abnormality of the cardiac and mediastinal silhouettes. BONES AND SOFT TISSUES: Surgical clips in right upper quadrant, consistent with prior cholecystectomy. No acute osseous abnormality. IMPRESSION: 1. Right IJ introducer and associated catheter with abrupt angulation over the upper SVC region, possibly extending in the azygos vein. 2. Endotracheal tube tip 2.8 cm above the carina. 3. Mild left mid and lower lung indistinct airspace opacity. 4. Prior cholecystectomy. Electronically signed by: Ryan Salvage MD 04/01/2024 02:30 PM EST RP Workstation: [id number]   Medications:  albumin  human Stopped (04/02/24 0600)   calcium  gluconate 2,000 mg (04/02/24 1029)   ertapenem  Stopped (04/02/24 0013)   linezolid  (ZYVOX ) IV Stopped (04/01/24 2335)    sodium chloride    Intravenous Once   sodium chloride    Intravenous Once   sodium chloride    Intravenous Once   amiodarone   200 mg Oral Daily   ascorbic acid   500 mg Oral BID   atorvastatin   10 mg Oral Daily   busPIRone   5 mg Oral TID   collagenase    Topical Daily   darbepoetin (ARANESP ) injection - DIALYSIS  150 mcg Subcutaneous Q Tue-1800   famotidine   20 mg Oral QHS   feeding supplement  1 Container Oral Q24H   fluconazole   200 mg Oral Weekly   heparin  injection (subcutaneous)  5,000 Units Subcutaneous Q8H   heparin  sodium (porcine)  1.9 mL Intracatheter Once   midodrine   15 mg Oral Q8H   multivitamin  1 tablet Oral QHS   mouth rinse  15 mL Mouth Rinse 4 times per day   risperiDONE   1 mg Oral QHS   sodium chloride  flush  10-40 mL Intracatheter Q12H    Assessment/Plan: ESRD - Usual TTS schedule - s/p CRRT  1/10-1/13 given significant shock - Dialyzed Friday 1/23 prior to [facility] removal. No acute indications for HD at this time. Will plan for temporary catheter placement and HD tomorrow.    PAfib - cardiology following, back on PO amiodarone    Hypotension/volume - Septic on admit,  S/p pressors - Now on high dose midodrine  20mg  TID + extra prn with HD. - Still up volume wise, UF as tolerated with HD   Sepsis/ESBL Klebsiella bacteremia - On ertapenem , has been ordered for outpatient (clinic will notify hospital when it arrives) - Blood Cx 03/16/24 positive - issues getting repeat cultures as she has horrible peripheral veins - TDC is out   Progressive dusky rash to pelvic/groin area, lower abd pannus and B legs  -  seen by general surgery for worsening fever, hypotension -s/p debridement on 04/01/24 -on  antibiotics per priamry team   Anemia of ESRD - Hgb 8.7 - S/p 2U PRBCs on 1/23 - Continue weekly Aranesp    Diarrhea - On empiric PO Vanc given Hx C.diff - Toxin negative on 1/10   T2DM - Insulin  per primary   Dispo/GOC - Palliative following.  - Plan is SNF    Lucie Collet, PA-C 04/02/2024, 10:31 AM  [facility] Pager: [phone]   "

## 2024-04-02 NOTE — Progress Notes (Signed)
 Phlebotomy unable to obtain blood cultures after multiple attempts by multiple members of the phlebotomy team.

## 2024-04-02 NOTE — Progress Notes (Signed)
 "   1 Day Post-Op  Subjective: Patient complaints of pain everywhere.  RN states she has been refusing some turning.    Objective: Vital signs in last 24 hours: Temp:  [97.6 F (36.4 C)-100.6 F (38.1 C)] 98.3 F (36.8 C) (01/26 1200) Pulse Rate:  [82-119] 91 (01/26 1200) Resp:  [10-32] 16 (01/26 1200) BP: (109-123)/(60-70) 120/60 (01/26 1200) SpO2:  [93 %-100 %] 100 % (01/26 1200) Arterial Line BP: (117-146)/(40-83) 130/52 (01/26 1100) Last BM Date : 03/31/24  Intake/Output from previous day: 01/25 0701 - 01/26 0700 In: 1790.2 [I.V.:606.5; NG/GT:425; IV Piggyback:758.8] Out: 60 [Urine:50; Blood:10] Intake/Output this shift: No intake/output data recorded.  PE: Skin: diffuse excoriation all over her body.  Left thigh wound is clean and repacked.  Left calf with edema and excoriation.  No fluctuance  Lab Results:  Recent Labs    04/01/24 1902 04/02/24 0548  WBC 20.7* 19.0*  HGB 8.9* 8.7*  HCT 27.0* 24.9*  PLT 259 251   BMET Recent Labs    04/01/24 1902 04/02/24 0548  NA 140 139  K 3.7 3.8  CL 102 100  CO2 24 23  GLUCOSE 117* 99  BUN 23* 26*  CREATININE 4.09* 4.19*  CALCIUM  7.9* 8.1*   PT/INR No results for input(s): LABPROT, INR in the last 72 hours. CMP     Component Value Date/Time   NA 139 04/02/2024 0548   NA 137 09/20/2019 1530   K 3.8 04/02/2024 0548   CL 100 04/02/2024 0548   CO2 23 04/02/2024 0548   GLUCOSE 99 04/02/2024 0548   BUN 26 (H) 04/02/2024 0548   BUN 18 09/20/2019 1530   CREATININE 4.19 (H) 04/02/2024 0548   CALCIUM  8.1 (L) 04/02/2024 0548   PROT 4.9 (L) 04/01/2024 1902   ALBUMIN  3.4 (L) 04/01/2024 1902   AST 14 (L) 04/01/2024 1902   ALT 6 04/01/2024 1902   ALKPHOS 61 04/01/2024 1902   BILITOT 0.9 04/01/2024 1902   GFRNONAA 12 (L) 04/02/2024 0548   GFRAA 49 (L) 11/30/2019 0356   Lipase     Component Value Date/Time   LIPASE <10 (L) 04/01/2024 1902       Studies/Results: DG Chest Port 1 View Result Date:  04/01/2024 EXAM: 1 VIEW(S) XRAY OF THE CHEST 04/01/2024 03:42:00 PM COMPARISON: 04/01/2024 CLINICAL HISTORY: Encounter for central line placement. FINDINGS: LINES, TUBES AND DEVICES: Right IJ central line in place. Patient extubated. LUNGS AND PLEURA: Persistent low lung volumes. No focal pulmonary opacity. No pleural effusion. No pneumothorax. HEART AND MEDIASTINUM: No acute abnormality of the cardiac and mediastinal silhouettes. BONES AND SOFT TISSUES: Surgical clips in the right upper quadrant. Degenerative changes in the spine and shoulders. No acute osseous abnormality. IMPRESSION: 1. Right IJ central line in place. 2. No acute findings. Electronically signed by: Elsie Gravely MD 04/01/2024 03:48 PM EST RP Workstation: [id number]   DG Chest Port 1 View Result Date: 04/01/2024 EXAM: 1 VIEW XRAY OF THE CHEST 04/01/2024 02:19:00 PM COMPARISON: 03/31/2024 CLINICAL HISTORY: Encounter for central line placement. ICD10: 747705 Encounter for central line placement. FINDINGS: LINES, TUBES AND DEVICES: Endotracheal tube in place with tip 2.8 cm above the carina. Right IJ introducer sheath is noted with associated catheter with abrupt angulation over the upper SVC region, possibly extending in the azygos vein although not entirely specific. LUNGS AND PLEURA: Low lung volume. Mild left mid and lower lung indistinct airspace opacity. Excluded right costophrenic angle. No pleural effusion. No pneumothorax. HEART AND MEDIASTINUM: No acute abnormality  of the cardiac and mediastinal silhouettes. BONES AND SOFT TISSUES: Surgical clips in right upper quadrant, consistent with prior cholecystectomy. No acute osseous abnormality. IMPRESSION: 1. Right IJ introducer and associated catheter with abrupt angulation over the upper SVC region, possibly extending in the azygos vein. 2. Endotracheal tube tip 2.8 cm above the carina. 3. Mild left mid and lower lung indistinct airspace opacity. 4. Prior cholecystectomy. Electronically  signed by: Ryan Salvage MD 04/01/2024 02:30 PM EST RP Workstation: [id number]    Anti-infectives: Anti-infectives (From admission, onward)    Start     Dose/Rate Route Frequency Ordered Stop   04/22/24 1000  vancomycin  (VANCOCIN ) 50 mg/mL oral solution SOLN 125 mg  Status:  Discontinued       Placed in Followed by Linked Group   125 mg Per Tube Every 3 DAYS 03/17/24 1020 03/17/24 1332   04/14/24 1000  vancomycin  (VANCOCIN ) 50 mg/mL oral solution SOLN 125 mg  Status:  Discontinued       Placed in Followed by Linked Group   125 mg Per Tube Every other day 03/17/24 1020 03/17/24 1332   04/07/24 1000  vancomycin  (VANCOCIN ) 50 mg/mL oral solution SOLN 125 mg  Status:  Discontinued       Placed in Followed by Linked Group   125 mg Per Tube Daily 03/17/24 1020 03/17/24 1332   04/02/24 1300  vancomycin  (VANCOCIN ) capsule 125 mg        125 mg Oral Daily 04/02/24 1157     04/01/24 1130  linezolid  (ZYVOX ) IVPB 600 mg        600 mg 300 mL/hr over 60 Minutes Intravenous Every 12 hours 04/01/24 1055     04/01/24 1030  vancomycin  (VANCOREADY) IVPB 1500 mg/300 mL  Status:  Discontinued        1,500 mg 150 mL/hr over 120 Minutes Intravenous  Once 04/01/24 0953 04/01/24 2252   03/31/24 1000  vancomycin  (VANCOCIN ) 50 mg/mL oral solution SOLN 125 mg  Status:  Discontinued       Placed in Followed by Linked Group   125 mg Per Tube 2 times daily 03/17/24 1020 03/17/24 1332   03/30/24 2200  ertapenem  (INVANZ ) 500 mg in sodium chloride  0.9 % 50 mL IVPB        500 mg 100 mL/hr over 30 Minutes Intravenous Every 24 hours 03/30/24 1520     03/28/24 1800  fluconazole  (DIFLUCAN ) tablet 200 mg        200 mg Oral Weekly 03/28/24 1206     03/21/24 2200  meropenem  (MERREM ) 1 g in sodium chloride  0.9 % 100 mL IVPB  Status:  Discontinued        1 g 200 mL/hr over 30 Minutes Intravenous Every 24 hours 03/20/24 1041 03/20/24 1153   03/20/24 2200  meropenem  (MERREM ) 1 g in sodium chloride  0.9 % 100 mL IVPB   Status:  Discontinued        1 g 200 mL/hr over 30 Minutes Intravenous Every 24 hours 03/20/24 1153 03/30/24 1519   03/19/24 1200  vancomycin  (VANCOCIN ) IVPB 1000 mg/200 mL premix  Status:  Discontinued        1,000 mg 200 mL/hr over 60 Minutes Intravenous  Once 03/19/24 0907 03/19/24 0907   03/19/24 1200  vancomycin  (VANCOCIN ) IVPB 1000 mg/200 mL premix  Status:  Discontinued        1,000 mg 200 mL/hr over 60 Minutes Intravenous Every 24 hours 03/19/24 0907 03/19/24 1110   03/17/24 2200  ceFEPIme  (MAXIPIME ) 2  g in sodium chloride  0.9 % 100 mL IVPB  Status:  Discontinued        2 g 200 mL/hr over 30 Minutes Intravenous Every 24 hours 03/17/24 0142 03/17/24 0732   03/17/24 1445  meropenem  (MERREM ) 1 g in sodium chloride  0.9 % 100 mL IVPB  Status:  Discontinued        1 g 200 mL/hr over 30 Minutes Intravenous Every 8 hours 03/17/24 1354 03/20/24 1153   03/17/24 1445  vancomycin  (VANCOCIN ) 50 mg/mL oral solution SOLN 125 mg       Placed in Or Linked Group   125 mg Per Tube Daily 03/17/24 1354 03/31/24 2359   03/17/24 1445  vancomycin  (VANCOCIN ) capsule 125 mg       Placed in Or Linked Group   125 mg Oral Daily 03/17/24 1354 03/31/24 2359   03/17/24 1445  vancomycin  (VANCOCIN ) IVPB 1000 mg/200 mL premix  Status:  Discontinued        1,000 mg 200 mL/hr over 60 Minutes Intravenous Every 24 hours 03/17/24 1354 03/18/24 0923   03/17/24 1430  fidaxomicin  (DIFICID ) tablet 200 mg  Status:  Discontinued        200 mg Tube 2 times daily 03/17/24 1332 03/17/24 1351   03/17/24 1115  vancomycin  (VANCOCIN ) capsule 125 mg  Status:  Discontinued        125 mg Oral 4 times daily 03/17/24 1020 03/17/24 1354   03/17/24 1115  vancomycin  (VANCOCIN ) 50 mg/mL oral solution SOLN 125 mg  Status:  Discontinued       Placed in Followed by Linked Group   125 mg Per Tube 4 times daily 03/17/24 1020 03/17/24 1332   03/17/24 1000  meropenem  (MERREM ) 500 mg in sodium chloride  0.9 % 100 mL IVPB  Status:   Discontinued        500 mg 200 mL/hr over 30 Minutes Intravenous Every 24 hours 03/17/24 0732 03/17/24 1354   03/17/24 0145  metroNIDAZOLE  (FLAGYL ) IVPB 500 mg  Status:  Discontinued        500 mg 100 mL/hr over 60 Minutes Intravenous 2 times daily 03/17/24 0140 03/17/24 0732   03/16/24 2114  vancomycin  variable dose per unstable renal function (pharmacist dosing)  Status:  Discontinued         Does not apply See admin instructions 03/16/24 2114 03/17/24 1354   03/16/24 1945  ceFEPIme  (MAXIPIME ) 2 g in sodium chloride  0.9 % 100 mL IVPB        2 g 200 mL/hr over 30 Minutes Intravenous  Once 03/16/24 1934 03/16/24 2034   03/16/24 1930  vancomycin  (VANCOREADY) IVPB 2000 mg/400 mL        2,000 mg 200 mL/hr over 120 Minutes Intravenous  Once 03/16/24 1918 03/16/24 2247   03/16/24 1930  piperacillin -tazobactam (ZOSYN ) IVPB 4.5 g  Status:  Discontinued        4.5 g 200 mL/hr over 30 Minutes Intravenous  Once 03/16/24 1918 03/16/24 1934        Assessment/Plan POD 1, s/p I&D of left thigh soft tissue infection, Dr. Teresa 1/25 -wound is clean, continue dressing changes -will scan LLE due to abnormality of left calf -WOC RN consult for diffuse excoriation of skin due to wetness -cont abx therapy -will follow    FEN - renal VTE - heparin  ID - Invanz josephus    LOS: 17 days    Burnard FORBES Banter , [facility] Surgery 04/02/2024, 12:29 PM Please see Amion for pager number during  day hours 7:00am-4:30pm or 7:00am -11:30am on weekends  "

## 2024-04-03 ENCOUNTER — Inpatient Hospital Stay (HOSPITAL_COMMUNITY)

## 2024-04-03 DIAGNOSIS — R55 Syncope and collapse: Secondary | ICD-10-CM | POA: Diagnosis not present

## 2024-04-03 LAB — BPAM RBC
Blood Product Expiration Date: 202602022359
Blood Product Unit Number: 202602012359
Blood Product Unit Number: 202602222359
ISSUE DATE / TIME: 202601231013
ISSUE DATE / TIME: 202601231013
ISSUING PHYSICIAN: 202601231013
PRODUCT CODE: 202602232359
PRODUCT CODE: 202602232359
Unit Type and Rh: 202602012359
Unit Type and Rh: 5100
Unit Type and Rh: 5100
Unit Type and Rh: 5100
Unit Type and Rh: 5100
Unit Type and Rh: 5100

## 2024-04-03 LAB — TYPE AND SCREEN
ABO/RH(D): O POS
Antibody Screen: NEGATIVE
Donor AG Type: NEGATIVE
Donor AG Type: NEGATIVE
Donor AG Type: NEGATIVE
Donor AG Type: NEGATIVE
Unit division: 0
Unit division: 0
Unit division: 0
Unit division: 0

## 2024-04-03 LAB — BASIC METABOLIC PANEL WITH GFR
Anion gap: 14 (ref 5–15)
BUN: 32 mg/dL — ABNORMAL HIGH (ref 6–20)
CO2: 23 mmol/L (ref 22–32)
Calcium: 8.2 mg/dL — ABNORMAL LOW (ref 8.9–10.3)
Chloride: 101 mmol/L (ref 98–111)
Creatinine, Ser: 5.04 mg/dL — ABNORMAL HIGH (ref 0.44–1.00)
GFR, Estimated: 9 mL/min — ABNORMAL LOW
Glucose, Bld: 145 mg/dL — ABNORMAL HIGH (ref 70–99)
Potassium: 3.1 mmol/L — ABNORMAL LOW (ref 3.5–5.1)
Sodium: 139 mmol/L (ref 135–145)

## 2024-04-03 LAB — GLUCOSE, CAPILLARY
Glucose-Capillary: 102 mg/dL — ABNORMAL HIGH (ref 70–99)
Glucose-Capillary: 103 mg/dL — ABNORMAL HIGH (ref 70–99)
Glucose-Capillary: 147 mg/dL — ABNORMAL HIGH (ref 70–99)

## 2024-04-03 LAB — CBC
HCT: 25.3 % — ABNORMAL LOW (ref 36.0–46.0)
Hemoglobin: 8.6 g/dL — ABNORMAL LOW (ref 12.0–15.0)
MCH: 32.2 pg (ref 26.0–34.0)
MCHC: 34 g/dL (ref 30.0–36.0)
MCV: 94.8 fL (ref 80.0–100.0)
Platelets: 237 10*3/uL (ref 150–400)
RBC: 2.67 MIL/uL — ABNORMAL LOW (ref 3.87–5.11)
RDW: 17.8 % — ABNORMAL HIGH (ref 11.5–15.5)
WBC: 14.3 10*3/uL — ABNORMAL HIGH (ref 4.0–10.5)
nRBC: 0 % (ref 0.0–0.2)

## 2024-04-03 MED ORDER — LIDOCAINE HCL 1 % IJ SOLN
INTRAMUSCULAR | Status: AC
  Filled 2024-04-03: qty 20

## 2024-04-03 MED ORDER — CALCIUM GLUCONATE-NACL 2-0.675 GM/100ML-% IV SOLN
2.0000 g | Freq: Once | INTRAVENOUS | Status: DC
  Filled 2024-04-03: qty 100

## 2024-04-03 MED ORDER — HEPARIN SODIUM (PORCINE) 1000 UNIT/ML IJ SOLN
INTRAMUSCULAR | Status: AC
  Filled 2024-04-03: qty 10

## 2024-04-03 MED ORDER — POTASSIUM CHLORIDE CRYS ER 20 MEQ PO TBCR: 40.0000 meq | EXTENDED_RELEASE_TABLET | Freq: Once | ORAL | Status: DC

## 2024-04-03 MED ORDER — MIDODRINE HCL 5 MG PO TABS
ORAL_TABLET | ORAL | Status: AC
  Filled 2024-04-03: qty 3

## 2024-04-03 MED ORDER — LIDOCAINE-EPINEPHRINE 1 %-1:100000 IJ SOLN
20.0000 mL | Freq: Once | INTRAMUSCULAR | Status: AC
  Administered 2024-04-03: 20 mL via INTRADERMAL

## 2024-04-03 NOTE — Progress Notes (Signed)
 " PROGRESS NOTE  Deborah [name]  FMW:[id number] DOB: 1965-12-23 DOA: 03/16/2024 PCP: Pcp, No   Brief Narrative: Patient is a 59 year old female with past medical history of paroxysmal A-fib, hyperlipidemia, right BKA, ESRD on dialysis on TTS schedule who presented from skilled nursing facility for further evaluation of syncopal episode with loss of consciousness.  On presentation, she was hypotensive.  Lab work showed creatinine of 7, troponin of 105, lactic acid of 9.  Patient also reported diffuse abdominal pain.  Patient was admitted and was started on broad-spectrum antibiotics.  She was recently hospitalized on last December for sepsis secondary to left thigh abscess and underwent I&D with general surgery on 02/16/2024.  On this admission, she was found to have sepsis secondary to Klebsiella bacteremia and was on meropenem , she underwent multiple sessions of CRRT .  She was transferred to [facility] service on 03/22/2024 but remained hypotensive, patient was also refusing care.  Found to have new progressive bilateral thigh erythema and swelling, concern for necrotizing fasciitis.  General surgery consulted.  Underwent excisional debridement of the left thigh skin, subcutaneous tissue.  Was briefly transferred to ICU for that on 1/25.  Patient transferred back to [facility] service on 1/26. Underwent placement of left internal jugular temporary dialysis catheter by IR today  Major Hospital events:  1/9 ED s/p witnessed syncopal event w/ +LOC in shock, started on Levo/Vaso; Aline/CVC placed 1/10.  CT abdomen pelvis in the ER.  Nonacute.   1/10 CRRT, still on vaso, NE. Esclated to meropenem  for ESBL Klebsiella 1/11 echocardiogram EF 60%.  No acute changes 1/12: off levophed , on vasopressin  1/13: off vasopressin , steroids, and CRRT 1/14: transfer out of ICU, TRH to pick up 1/15 1/23: Transfused units PRBC for hemoglobin of 6.7 1/23: TDC catheter continued by IR 1/25: Remains febrile, with rapidly  progressive bilateral groin/thigh/lower abdomen area erythema swelling concerning for necrotizing fasciitis 1/27: Underwent placement of left internal jugular temporary dialysis catheter by IR        Assessment & Plan:  Principal Problem:   Syncope Active Problems:   Atrial fibrillation with RVR (HCC)-resolved   Hypotension   Bacteremia due to Klebsiella pneumoniae   Encephalopathy acute   Septic shock (HCC)   Encounter for continuous renal replacement therapy (CRRT) for acute renal failure   ESRD (end stage renal disease) (HCC)   Non-healing open wound of left groin   Sepsis due to Klebsiella (HCC)   ESBL (extended spectrum beta-lactamase) producing bacteria infection   Hemodialysis catheter infection   Bullous rash   Exfoliative dermatitis   Bacteremia   Intertrigo   Fever  Suspected necrotizing fasciitis/cellulitis of left lower extremity: Developed rapidly progressive lower extremity edema.  There was concern for necrotizing fasciitis.  General surgery consulted.  Underwent excisional debridement of the left thigh on 1/26. Currently on linezolid , ertapenem , fluconazole  Looks like wound culture not sent while she underwent debridement. CT follow up on 1/27 showed subcutaneous edema in the calf, primarily posterior and anteromedial with possible cellulitis; no drainable abscess and no CT findings of osteomyelitis.Soft tissue abnormality posterior to the distal tibia, consistent with wound or ulceration.  General surgery signed off She also had a history of left groin abscess on last admission and underwent I&D by Dr. Vernetta on 02/16/2024.  At that time CT abdomen/pelvis did not show any acute findings.  She was also seen by orthopedics, Dr. Harden on 1/15. Continue current antibiotics as per ID.  Continue wound dressing as per wound nurse.  General  surgery will arrange outpatient follow-up in 3 to 4 weeks  Septic shock secondary to Klebsiella pneumonia bacteremia: Source of  infection was unclear.  Has leukocytosis, intermittent fever.  Currently on ertapenem , linezolid .  Very difficult stick, repeat blood cultures could not be sent.  The last 1 was done from her HD catheter.  ID following  ESRD on dialysis: Nephrology following.  Dialyzed on TTS schedule.  IR placed new temporary dialysis catheter on 1/27  Chronic hypotension: Required vasopressor therapy, currently on midodrine .  Currently off vasopressors.  Blood pressure stable   Anemia of chronic disease: Likely secondary to ESRD.  Given blood transfusion here.  Currently hemoglobin is stable  Opioid use disorder/substance abuse/history of fibromyalgia: Follows outpatient with [facility] pain clinic.  Minimize narcotics  History of C. difficile: C. difficile negative.  On prophylactic vancomycin  because she is on antibiotics  Syncope: Echo showed EF of 60 to 65%, mild LVH, a small pericardial effusion posteriorly.  Troponin elevation/paroxysmal A-fib: Not on anticoagulant due to history of GI bleed.  Troponin elevation is secondary to demand ischemia.  Echo showed preserved EF no wall motion abnormality, no chest pain. She was seen by cardiology for transient RVR and was started on amiodarone  drip on 03/23/2024. Cardiology following.On oral amiodarone .  Controlled rate, normal sinus rhythm  Hyperlipidemia: On statin  History of duodenal carcinoid tumor s/p resection and lung monitor/gastric ulcer/gastritis/chronic gastroparesis: On H2 blocker,  History of peripheral artery disease: Status post right BKA.  Stump is well-healed  Possible vaginal yeast infection: On weekly Diflucan   Anxiety/depression: On BuSpar , risperidone , trazodone  at home  Diabetes type 2: On sliding scale now.  A1c 4.3 as per 02/15/2024.  Will DC insulin   Debility/deconditioning: Patient is from SNF.  Plan for discharge to SNF when appropriate.  Long length of stay    Pressure ulcers:  Had wound on the left thigh, right hip, lower  abdomen, buttocks, left heel.  Wound care nurse following .Dr. Harden was consulted to evaluate left thigh postop site.   Nutrition Problem: Moderate Malnutrition Etiology: acute illness Wound 02/19/24 0200 Pressure Injury Foot Left;Lateral Unstageable - Full thickness tissue loss in which the base of the injury is covered by slough (yellow, tan, gray, green or brown) and/or eschar (tan, brown or black) in the wound bed. (Active)     Wound 03/17/24 0045 Pressure Injury Thigh Left;Posterior Stage 2 -  Partial thickness loss of dermis presenting as a shallow open injury with a red, pink wound bed without slough. (Active)     Wound 03/17/24 0045 Pressure Injury Hip Lateral;Right Stage 2 -  Partial thickness loss of dermis presenting as a shallow open injury with a red, pink wound bed without slough. (Active)     Wound 03/17/24 0045 Pressure Injury Abdomen Lower;Left Stage 2 -  Partial thickness loss of dermis presenting as a shallow open injury with a red, pink wound bed without slough. (Active)     Wound 03/17/24 1119 Pressure Injury Buttocks Right;Left;Medial Stage 3 -  Full thickness tissue loss. Subcutaneous fat may be visible but bone, tendon or muscle are NOT exposed. (Active)    DVT prophylaxis:heparin  injection 5,000 Units Start: 04/02/24 1400 Place and maintain sequential compression device Start: 03/20/24 1800     Code Status: Full Code  Family Communication: None at the bedside  Patient status: Inpatient inpatient  Patient is from : SNF  Anticipated discharge to: SNF  Estimated DC date: Not sure.  Needs clearance from ID,nephrology   Consultants: Nephrology, critical  care, general surgery, cardiology, palliative care  Procedures: Dialysis, I&D  Antimicrobials:  Anti-infectives (From admission, onward)    Start     Dose/Rate Route Frequency Ordered Stop   04/22/24 1000  vancomycin  (VANCOCIN ) 50 mg/mL oral solution SOLN 125 mg  Status:  Discontinued       Placed in  Followed by Linked Group   125 mg Per Tube Every 3 DAYS 03/17/24 1020 03/17/24 1332   04/14/24 1000  vancomycin  (VANCOCIN ) 50 mg/mL oral solution SOLN 125 mg  Status:  Discontinued       Placed in Followed by Linked Group   125 mg Per Tube Every other day 03/17/24 1020 03/17/24 1332   04/07/24 1000  vancomycin  (VANCOCIN ) 50 mg/mL oral solution SOLN 125 mg  Status:  Discontinued       Placed in Followed by Linked Group   125 mg Per Tube Daily 03/17/24 1020 03/17/24 1332   04/02/24 1300  vancomycin  (VANCOCIN ) capsule 125 mg        125 mg Oral Daily 04/02/24 1157 04/22/24 2359   04/01/24 1130  linezolid  (ZYVOX ) IVPB 600 mg        600 mg 300 mL/hr over 60 Minutes Intravenous Every 12 hours 04/01/24 1055 04/15/24 2359   04/01/24 1030  vancomycin  (VANCOREADY) IVPB 1500 mg/300 mL  Status:  Discontinued        1,500 mg 150 mL/hr over 120 Minutes Intravenous  Once 04/01/24 0953 04/01/24 2252   03/31/24 1000  vancomycin  (VANCOCIN ) 50 mg/mL oral solution SOLN 125 mg  Status:  Discontinued       Placed in Followed by Linked Group   125 mg Per Tube 2 times daily 03/17/24 1020 03/17/24 1332   03/30/24 2200  ertapenem  (INVANZ ) 500 mg in sodium chloride  0.9 % 50 mL IVPB        500 mg 100 mL/hr over 30 Minutes Intravenous Every 24 hours 03/30/24 1520 04/13/24 2359   03/28/24 1800  fluconazole  (DIFLUCAN ) tablet 200 mg        200 mg Oral Weekly 03/28/24 1206 05/02/24 1759   03/21/24 2200  meropenem  (MERREM ) 1 g in sodium chloride  0.9 % 100 mL IVPB  Status:  Discontinued        1 g 200 mL/hr over 30 Minutes Intravenous Every 24 hours 03/20/24 1041 03/20/24 1153   03/20/24 2200  meropenem  (MERREM ) 1 g in sodium chloride  0.9 % 100 mL IVPB  Status:  Discontinued        1 g 200 mL/hr over 30 Minutes Intravenous Every 24 hours 03/20/24 1153 03/30/24 1519   03/19/24 1200  vancomycin  (VANCOCIN ) IVPB 1000 mg/200 mL premix  Status:  Discontinued        1,000 mg 200 mL/hr over 60 Minutes Intravenous  Once  03/19/24 0907 03/19/24 0907   03/19/24 1200  vancomycin  (VANCOCIN ) IVPB 1000 mg/200 mL premix  Status:  Discontinued        1,000 mg 200 mL/hr over 60 Minutes Intravenous Every 24 hours 03/19/24 0907 03/19/24 1110   03/17/24 2200  ceFEPIme  (MAXIPIME ) 2 g in sodium chloride  0.9 % 100 mL IVPB  Status:  Discontinued        2 g 200 mL/hr over 30 Minutes Intravenous Every 24 hours 03/17/24 0142 03/17/24 0732   03/17/24 1445  meropenem  (MERREM ) 1 g in sodium chloride  0.9 % 100 mL IVPB  Status:  Discontinued        1 g 200 mL/hr over 30 Minutes Intravenous Every  8 hours 03/17/24 1354 03/20/24 1153   03/17/24 1445  vancomycin  (VANCOCIN ) 50 mg/mL oral solution SOLN 125 mg       Placed in Or Linked Group   125 mg Per Tube Daily 03/17/24 1354 03/31/24 2359   03/17/24 1445  vancomycin  (VANCOCIN ) capsule 125 mg       Placed in Or Linked Group   125 mg Oral Daily 03/17/24 1354 03/31/24 2359   03/17/24 1445  vancomycin  (VANCOCIN ) IVPB 1000 mg/200 mL premix  Status:  Discontinued        1,000 mg 200 mL/hr over 60 Minutes Intravenous Every 24 hours 03/17/24 1354 03/18/24 0923   03/17/24 1430  fidaxomicin  (DIFICID ) tablet 200 mg  Status:  Discontinued        200 mg Tube 2 times daily 03/17/24 1332 03/17/24 1351   03/17/24 1115  vancomycin  (VANCOCIN ) capsule 125 mg  Status:  Discontinued        125 mg Oral 4 times daily 03/17/24 1020 03/17/24 1354   03/17/24 1115  vancomycin  (VANCOCIN ) 50 mg/mL oral solution SOLN 125 mg  Status:  Discontinued       Placed in Followed by Linked Group   125 mg Per Tube 4 times daily 03/17/24 1020 03/17/24 1332   03/17/24 1000  meropenem  (MERREM ) 500 mg in sodium chloride  0.9 % 100 mL IVPB  Status:  Discontinued        500 mg 200 mL/hr over 30 Minutes Intravenous Every 24 hours 03/17/24 0732 03/17/24 1354   03/17/24 0145  metroNIDAZOLE  (FLAGYL ) IVPB 500 mg  Status:  Discontinued        500 mg 100 mL/hr over 60 Minutes Intravenous 2 times daily 03/17/24 0140 03/17/24  0732   03/16/24 2114  vancomycin  variable dose per unstable renal function (pharmacist dosing)  Status:  Discontinued         Does not apply See admin instructions 03/16/24 2114 03/17/24 1354   03/16/24 1945  ceFEPIme  (MAXIPIME ) 2 g in sodium chloride  0.9 % 100 mL IVPB        2 g 200 mL/hr over 30 Minutes Intravenous  Once 03/16/24 1934 03/16/24 2034   03/16/24 1930  vancomycin  (VANCOREADY) IVPB 2000 mg/400 mL        2,000 mg 200 mL/hr over 120 Minutes Intravenous  Once 03/16/24 1918 03/16/24 2247   03/16/24 1930  piperacillin -tazobactam (ZOSYN ) IVPB 4.5 g  Status:  Discontinued        4.5 g 200 mL/hr over 30 Minutes Intravenous  Once 03/16/24 1918 03/16/24 1934       Subjective: Patient seen and examined at bedside today.  Hemodynamically stable.  Lying on bed.  She looks comfortable.  Completely alert and oriented.  Denied any new complaints.  Pain on the left thigh well-controlled.  She was being moved to IR for temporary dialysis catheter placement.   Objective: Vitals:   04/03/24 0409 04/03/24 0600 04/03/24 0754 04/03/24 0800  BP:  107/69  110/63  Pulse:  78  88  Resp:  (!) 22  14  Temp: 97.8 F (36.6 C)  97.8 F (36.6 C)   TempSrc: Oral  Oral   SpO2:  100%  100%  Weight:      Height:        Intake/Output Summary (Last 24 hours) at 04/03/2024 1103 Last data filed at 04/03/2024 0400 Gross per 24 hour  Intake 1360.01 ml  Output --  Net 1360.01 ml   Filed Weights   03/30/24 0848  03/30/24 1250 04/01/24 0500  Weight: (S) 105.4 kg (S) 104.2 kg 96.5 kg    Examination: General exam: Overall comfortable, not in distress, chronically deconditioned, lying on HEENT: PERRL, central line on the right neck Respiratory system:  no wheezes or crackles, diminished sounds in bases Cardiovascular system: S1 & S2 heard, RRR.  Gastrointestinal system: Abdomen is nondistended, soft and nontender. Central nervous system: Alert and oriented Extremities: Dressing on the left thigh,  right BKA,  ulcer on the left ankle and heel Skin: No rashes,no icterus     Data Reviewed: I have personally reviewed following labs and imaging studies  CBC: Recent Labs  Lab 03/27/24 1328 03/29/24 1230 03/30/24 0846 03/30/24 1454 04/01/24 1401 04/01/24 1902 04/02/24 0548  WBC 23.2* 19.4* 15.1* 16.1*  --  20.7* 19.0*  NEUTROABS 19.1*  --  11.9*  --   --  19.4*  --   HGB 8.1* 8.1* 6.7* 9.7* 10.2* 8.9* 8.7*  HCT 24.9* 25.7* 20.4* 28.3* 30.0* 27.0* 24.9*  MCV 97.6 100.8* 98.6 92.5  --  96.1 92.6  PLT 370 390 337 324  --  259 251   Basic Metabolic Panel: Recent Labs  Lab 03/27/24 1327 03/29/24 1230 03/30/24 0846 04/01/24 1401 04/01/24 1902 04/02/24 0548  NA 139 138 140 140 140 139  K 3.7 3.8 3.5 3.4* 3.7 3.8  CL 105 102 106  --  102 100  CO2 24 24 23   --  24 23  GLUCOSE 162* 163* 85  --  117* 99  BUN 30* 22* 25*  --  23* 26*  CREATININE 4.33* 3.84* 4.31*  --  4.09* 4.19*  CALCIUM  7.1* 7.4* 8.0*  --  7.9* 8.1*  MG 2.1 1.9 2.1  --  1.8 2.0  PHOS 4.5 4.3 4.3  --  3.8 4.3     No results found for this or any previous visit (from the past 240 hours).   Radiology Studies: IR NON-TUNNELED CENTRAL VENOUS CATH [facility] W IMG Result Date: 04/03/2024 INDICATION: End-stage renal disease. Tunneled dialysis catheter was removed on 03/30/2022 due to bacteremia. Patient needs new temporary dialysis access. EXAM: FLUOROSCOPIC AND ULTRASOUND GUIDED PLACEMENT OF A NON-TUNNELED DIALYSIS CATHETER Physician: Juliene SAUNDERS. Philip, MD MEDICATIONS: 1% lidocaine  for local anesthetic ANESTHESIA/SEDATION: None FLUOROSCOPY TIME:  Radiation Exposure Index (as provided by the fluoroscopic device): 7 mGy Kerma COMPLICATIONS: None immediate. PROCEDURE: Informed consent was obtained for catheter placement. The patient was placed supine on the interventional table. Ultrasound confirmed a patent left internal jugular vein. Ultrasound images were obtained for documentation. The left neck was prepped and draped in a  sterile fashion. Maximal barrier sterile technique was utilized including caps, mask, sterile gowns, sterile gloves, sterile drape, hand hygiene and skin antiseptic. The right neck was anesthetized with 1% lidocaine . A small incision was made with #11 blade scalpel. A 21 gauge needle directed into the left internal jugular vein with ultrasound guidance. A micropuncture dilator set was placed. J wire was placed and a Kumpe catheter was advanced to the IVC. J wire was exchanged for a superstiff Amplatz wire. A 20 cm Mahurkar catheter was selected. The catheter was advanced over a wire and positioned at the superior cavoatrial junction. Fluoroscopic images were obtained for documentation. Both dialysis lumens were found to aspirate and flush well. The proper amount of heparin  was flushed in both lumens. The central venous lumen was flushed with normal saline. Catheter was sutured to skin. FINDINGS: Catheter tip at the superior cavoatrial junction. IMPRESSION: Successful placement  of a left jugular non-tunneled dialysis catheter using ultrasound and fluoroscopic guidance. Electronically Signed   By: Juliene Balder M.D.   On: 04/03/2024 10:09   CT TIBIA FIBULA LEFT WO CONTRAST Result Date: 04/03/2024 EXAM: CT Left Lower Extremity, Without IV Contrast 04/02/2024 03:48:57 PM TECHNIQUE: Axial images were acquired through the left lower extremity without IV contrast. Reformatted images were reviewed. Automated exposure control, iterative reconstruction, and/or weight based adjustment of the mA/kV was utilized to reduce the radiation dose to as low as reasonably achievable. COMPARISON: None available. CLINICAL HISTORY: Left calf edema, infection of left thigh debrided 01/25. FINDINGS: BONES AND JOINTS: Severe osteoarthritis of the left knee. There is at least a small left knee effusion. Expected small free osteochondral fragments posteriorly in the knee joint and then a small Baker cyst. Substantial osteoarthritis at the  tibiotalar articulation with loss of articular space and spurring. Degenerative spurring along the Chopart joint and in the midfoot. Confluent subcutaneous edema superficial to the posteromedial margin of the calcaneus, there is some underlying spurring in this vicinity but no definite bony destructive findings to indicate osteomyelitis. No bony destructive findings characteristic of osteomyelitis. No acute fracture or focal osseous lesion. No dislocation. SOFT TISSUES: Atheromatous vascular calcifications. Posterior to the distal tibia for example on image 37 series 9, possibly from wound or ulceration. Subcutaneous edema in the calf becomes circumferential approaching the ankle what is primarily posterior and anteromedial elsewhere in the calf. Cellulitis is not excluded. Moderate regional muscular atrophy. No drainable abscess identified. Thickened medial band of the plantar fascia, cannot exclude plantar fasciitis. No findings characteristic for abscess identified. IMPRESSION: 1. Subcutaneous edema in the calf, primarily posterior and anteromedial, becoming circumferential near the ankle, with possible cellulitis; no drainable abscess and no CT findings of osteomyelitis. 2. Soft tissue abnormality posterior to the distal tibia, consistent with wound or ulceration. 3. Severe osteoarthritis of the left knee with a small effusion, small Baker cyst, and small posterior intra-articular osteochondral fragments. 4. Substantial osteoarthritis at the tibiotalar articulation with joint space loss and spurring. 5. Degenerative spurring along the Chopart joint and in the midfoot, with calcaneal spurring along the posteromedial margin. 6. Thickened medial band of the plantar fascia, possible plantar fasciitis. 7. Moderate regional muscular atrophy. 8. Atheromatous vascular calcifications. 9. No acute osseous abnormality. Electronically signed by: Ryan Salvage MD 04/03/2024 08:09 AM EST RP Workstation: [id number]   DG  Chest Port 1 View Result Date: 04/01/2024 EXAM: 1 VIEW(S) XRAY OF THE CHEST 04/01/2024 03:42:00 PM COMPARISON: 04/01/2024 CLINICAL HISTORY: Encounter for central line placement. FINDINGS: LINES, TUBES AND DEVICES: Right IJ central line in place. Patient extubated. LUNGS AND PLEURA: Persistent low lung volumes. No focal pulmonary opacity. No pleural effusion. No pneumothorax. HEART AND MEDIASTINUM: No acute abnormality of the cardiac and mediastinal silhouettes. BONES AND SOFT TISSUES: Surgical clips in the right upper quadrant. Degenerative changes in the spine and shoulders. No acute osseous abnormality. IMPRESSION: 1. Right IJ central line in place. 2. No acute findings. Electronically signed by: Elsie Gravely MD 04/01/2024 03:48 PM EST RP Workstation: [id number]   DG Chest Port 1 View Result Date: 04/01/2024 EXAM: 1 VIEW XRAY OF THE CHEST 04/01/2024 02:19:00 PM COMPARISON: 03/31/2024 CLINICAL HISTORY: Encounter for central line placement. ICD10: [id number] Encounter for central line placement. FINDINGS: LINES, TUBES AND DEVICES: Endotracheal tube in place with tip 2.8 cm above the carina. Right IJ introducer sheath is noted with associated catheter with abrupt angulation over the upper SVC region, possibly extending in the  azygos vein although not entirely specific. LUNGS AND PLEURA: Low lung volume. Mild left mid and lower lung indistinct airspace opacity. Excluded right costophrenic angle. No pleural effusion. No pneumothorax. HEART AND MEDIASTINUM: No acute abnormality of the cardiac and mediastinal silhouettes. BONES AND SOFT TISSUES: Surgical clips in right upper quadrant, consistent with prior cholecystectomy. No acute osseous abnormality. IMPRESSION: 1. Right IJ introducer and associated catheter with abrupt angulation over the upper SVC region, possibly extending in the azygos vein. 2. Endotracheal tube tip 2.8 cm above the carina. 3. Mild left mid and lower lung indistinct airspace opacity. 4. Prior  cholecystectomy. Electronically signed by: Ryan Salvage MD 04/01/2024 02:30 PM EST RP Workstation: [id number]    Scheduled Meds:  amiodarone   200 mg Oral Daily   ascorbic acid   500 mg Oral BID   atorvastatin   10 mg Oral Daily   busPIRone   5 mg Oral TID   collagenase    Topical Daily   darbepoetin (ARANESP ) injection - DIALYSIS  150 mcg Subcutaneous Q Tue-1800   famotidine   20 mg Oral QHS   feeding supplement  1 Container Oral Q24H   fluconazole   200 mg Oral Weekly   heparin  injection (subcutaneous)  5,000 Units Subcutaneous Q8H   heparin  sodium (porcine)  1.9 mL Intracatheter Once   midodrine   15 mg Oral Q8H   multivitamin  1 tablet Oral QHS   mouth rinse  15 mL Mouth Rinse 4 times per day   risperiDONE   1 mg Oral QHS   sodium chloride  flush  10-40 mL Intracatheter Q12H   vancomycin   125 mg Oral Daily   Continuous Infusions:  ertapenem  Stopped (04/02/24 2313)   linezolid  (ZYVOX ) IV Stopped (04/02/24 2334)     LOS: 18 days   Ivonne Mustache, MD [facility] P1/27/2026, 11:03 AM  "

## 2024-04-03 NOTE — Progress Notes (Signed)
 Unable to obtain blood return from introducer/central line after multiple attempts. Patient refused phlebotomy lab draw, stated her labs could be obtained during dialysis later today.

## 2024-04-03 NOTE — Progress Notes (Signed)
 " Deborah [name] Progress Note   Subjective:   Seen in room, appears tired but awakens to voice and oriented to person, place, time. Denies SOB, CP, dizziness, nausea, leg pain. Finally has a little bit of an appetite and ate breakfast this am  Objective Vitals:   04/03/24 0317 04/03/24 0409 04/03/24 0600 04/03/24 0754  BP: (!) 103/59  107/69   Pulse: 82  78   Resp: (!) 21  (!) 22   Temp:  97.8 F (36.6 C)  97.8 F (36.6 C)  TempSrc:  Oral  Oral  SpO2: 100%  100%   Weight:      Height:       Physical Exam General: chronically ill appearing female in NAD Heart: RRR, no murmur Lungs: CTA bilaterally, respirations unlabored Abdomen: Soft, non-distended, +BS Extremities: 1-2+ edema bilateral lower extremities tender, rt LE amputation Dialysis Access: none, lt BCF pulsatile and augments but very floppy skin  Additional Objective Labs: Basic Metabolic Panel: Recent Labs  Lab 03/30/24 0846 04/01/24 1401 04/01/24 1902 04/02/24 0548  NA 140 140 140 139  K 3.5 3.4* 3.7 3.8  CL 106  --  102 100  CO2 23  --  24 23  GLUCOSE 85  --  117* 99  BUN 25*  --  23* 26*  CREATININE 4.31*  --  4.09* 4.19*  CALCIUM  8.0*  --  7.9* 8.1*  PHOS 4.3  --  3.8 4.3   Liver Function Tests: Recent Labs  Lab 03/29/24 1230 03/30/24 0846 04/01/24 1902  AST  --   --  14*  ALT  --   --  6  ALKPHOS  --   --  61  BILITOT  --   --  0.9  PROT  --   --  4.9*  ALBUMIN  1.9* 2.6* 3.4*   Recent Labs  Lab 04/01/24 1902  LIPASE <10*  AMYLASE 20*   CBC: Recent Labs  Lab 03/27/24 1328 03/29/24 1230 03/30/24 0846 03/30/24 1454 04/01/24 1401 04/01/24 1902 04/02/24 0548  WBC 23.2* 19.4* 15.1* 16.1*  --  20.7* 19.0*  NEUTROABS 19.1*  --  11.9*  --   --  19.4*  --   HGB 8.1* 8.1* 6.7* 9.7* 10.2* 8.9* 8.7*  HCT 24.9* 25.7* 20.4* 28.3* 30.0* 27.0* 24.9*  MCV 97.6 100.8* 98.6 92.5  --  96.1 92.6  PLT 370 390 337 324  --  259 251   Blood Culture    Component Value Date/Time   SDES  BLOOD RIGHT ANTECUBITAL 03/16/2024 1949   SPECREQUEST  03/16/2024 1949    BOTTLES DRAWN AEROBIC AND ANAEROBIC Blood Culture results may not be optimal due to an inadequate volume of blood received in culture bottles   CULT (A) 03/16/2024 1949    KLEBSIELLA PNEUMONIAE Confirmed Extended Spectrum Beta-Lactamase Producer (ESBL).  In bloodstream infections from ESBL organisms, carbapenems are preferred over piperacillin /tazobactam. They are shown to have a lower risk of mortality.    REPTSTATUS 03/19/2024 FINAL 03/16/2024 1949    Cardiac Enzymes: No results for input(s): CKTOTAL, CKMB, CKMBINDEX, TROPONINI in the last 168 hours. CBG: Recent Labs  Lab 04/02/24 0913 04/02/24 1241 04/02/24 1712 04/02/24 2152 04/03/24 0749  GLUCAP 84 102* 95 142* 103*   Iron  Studies: No results for input(s): IRON , TIBC, TRANSFERRIN, FERRITIN in the last 72 hours. @lablastinr3 @ Studies/Results: CT TIBIA FIBULA LEFT WO CONTRAST Result Date: 04/03/2024 EXAM: CT Left Lower Extremity, Without IV Contrast 04/02/2024 03:48:57 PM TECHNIQUE: Axial images were acquired through  the left lower extremity without IV contrast. Reformatted images were reviewed. Automated exposure control, iterative reconstruction, and/or weight based adjustment of the mA/kV was utilized to reduce the radiation dose to as low as reasonably achievable. COMPARISON: None available. CLINICAL HISTORY: Left calf edema, infection of left thigh debrided 01/25. FINDINGS: BONES AND JOINTS: Severe osteoarthritis of the left knee. There is at least a small left knee effusion. Expected small free osteochondral fragments posteriorly in the knee joint and then a small Baker cyst. Substantial osteoarthritis at the tibiotalar articulation with loss of articular space and spurring. Degenerative spurring along the Chopart joint and in the midfoot. Confluent subcutaneous edema superficial to the posteromedial margin of the calcaneus, there is some  underlying spurring in this vicinity but no definite bony destructive findings to indicate osteomyelitis. No bony destructive findings characteristic of osteomyelitis. No acute fracture or focal osseous lesion. No dislocation. SOFT TISSUES: Atheromatous vascular calcifications. Posterior to the distal tibia for example on image 37 series 9, possibly from wound or ulceration. Subcutaneous edema in the calf becomes circumferential approaching the ankle what is primarily posterior and anteromedial elsewhere in the calf. Cellulitis is not excluded. Moderate regional muscular atrophy. No drainable abscess identified. Thickened medial band of the plantar fascia, cannot exclude plantar fasciitis. No findings characteristic for abscess identified. IMPRESSION: 1. Subcutaneous edema in the calf, primarily posterior and anteromedial, becoming circumferential near the ankle, with possible cellulitis; no drainable abscess and no CT findings of osteomyelitis. 2. Soft tissue abnormality posterior to the distal tibia, consistent with wound or ulceration. 3. Severe osteoarthritis of the left knee with a small effusion, small Baker cyst, and small posterior intra-articular osteochondral fragments. 4. Substantial osteoarthritis at the tibiotalar articulation with joint space loss and spurring. 5. Degenerative spurring along the Chopart joint and in the midfoot, with calcaneal spurring along the posteromedial margin. 6. Thickened medial band of the plantar fascia, possible plantar fasciitis. 7. Moderate regional muscular atrophy. 8. Atheromatous vascular calcifications. 9. No acute osseous abnormality. Electronically signed by: Ryan Salvage MD 04/03/2024 08:09 AM EST RP Workstation: [id number]   DG Chest Port 1 View Result Date: 04/01/2024 EXAM: 1 VIEW(S) XRAY OF THE CHEST 04/01/2024 03:42:00 PM COMPARISON: 04/01/2024 CLINICAL HISTORY: Encounter for central line placement. FINDINGS: LINES, TUBES AND DEVICES: Right IJ central  line in place. Patient extubated. LUNGS AND PLEURA: Persistent low lung volumes. No focal pulmonary opacity. No pleural effusion. No pneumothorax. HEART AND MEDIASTINUM: No acute abnormality of the cardiac and mediastinal silhouettes. BONES AND SOFT TISSUES: Surgical clips in the right upper quadrant. Degenerative changes in the spine and shoulders. No acute osseous abnormality. IMPRESSION: 1. Right IJ central line in place. 2. No acute findings. Electronically signed by: Elsie Gravely MD 04/01/2024 03:48 PM EST RP Workstation: [id number]   DG Chest Port 1 View Result Date: 04/01/2024 EXAM: 1 VIEW XRAY OF THE CHEST 04/01/2024 02:19:00 PM COMPARISON: 03/31/2024 CLINICAL HISTORY: Encounter for central line placement. ICD10: [id number] Encounter for central line placement. FINDINGS: LINES, TUBES AND DEVICES: Endotracheal tube in place with tip 2.8 cm above the carina. Right IJ introducer sheath is noted with associated catheter with abrupt angulation over the upper SVC region, possibly extending in the azygos vein although not entirely specific. LUNGS AND PLEURA: Low lung volume. Mild left mid and lower lung indistinct airspace opacity. Excluded right costophrenic angle. No pleural effusion. No pneumothorax. HEART AND MEDIASTINUM: No acute abnormality of the cardiac and mediastinal silhouettes. BONES AND SOFT TISSUES: Surgical clips in right upper quadrant, consistent with  prior cholecystectomy. No acute osseous abnormality. IMPRESSION: 1. Right IJ introducer and associated catheter with abrupt angulation over the upper SVC region, possibly extending in the azygos vein. 2. Endotracheal tube tip 2.8 cm above the carina. 3. Mild left mid and lower lung indistinct airspace opacity. 4. Prior cholecystectomy. Electronically signed by: Ryan Salvage MD 04/01/2024 02:30 PM EST RP Workstation: [id number]   Medications:  ertapenem  Stopped (04/02/24 2313)   linezolid  (ZYVOX ) IV Stopped (04/02/24 2334)    amiodarone    200 mg Oral Daily   ascorbic acid   500 mg Oral BID   atorvastatin   10 mg Oral Daily   busPIRone   5 mg Oral TID   collagenase    Topical Daily   darbepoetin (ARANESP ) injection - DIALYSIS  150 mcg Subcutaneous Q Tue-1800   famotidine   20 mg Oral QHS   feeding supplement  1 Container Oral Q24H   fluconazole   200 mg Oral Weekly   heparin  injection (subcutaneous)  5,000 Units Subcutaneous Q8H   heparin  sodium (porcine)  1.9 mL Intracatheter Once   midodrine   15 mg Oral Q8H   multivitamin  1 tablet Oral QHS   mouth rinse  15 mL Mouth Rinse 4 times per day   risperiDONE   1 mg Oral QHS   sodium chloride  flush  10-40 mL Intracatheter Q12H   vancomycin   125 mg Oral Daily    Assessment/Plan: ESRD - Usual TTS schedule - s/p CRRT 1/10-1/13 given significant shock - Dialyzed Friday 1/23 prior to [facility] removal. No acute indications for HD at this time. Appreciate CCM planning on temporary catheter placement and HD tomorrow.  Lt BCF is not usable at this time (very floppy skin and will only lead to infiltration)   PAfib - cardiology following, back on PO amiodarone    Hypotension/volume - Septic on admit,  S/p pressors - Now on high dose midodrine  20mg  TID + extra prn with HD. - Still up volume wise, UF as tolerated with HD   Sepsis/ESBL Klebsiella bacteremia - On ertapenem , has been ordered for outpatient (clinic will notify hospital when it arrives) - Blood Cx 03/16/24 positive - issues getting repeat cultures as she has horrible peripheral veins - TDC is out   Progressive dusky rash to pelvic/groin area, lower abd pannus and B legs  - seen by general surgery for worsening fever, hypotension -s/p debridement on 04/01/24 -on  antibiotics per priamry team   Anemia of ESRD - Hgb 8.7 - S/p 2U PRBCs on 1/23 - Continue weekly Aranesp    Diarrhea - On empiric PO Vanc given Hx C.diff - Toxin negative on 1/10   T2DM - Insulin  per primary   Dispo/GOC - Palliative following.  - Plan is SNF       "

## 2024-04-03 NOTE — Progress Notes (Signed)
 PT Cancellation Note  Patient Details Name: Deborah [name] MRN: [id number] DOB: 06/17/1965   Cancelled Treatment:    Reason Eval/Treat Not Completed: Patient declined, no reason specified. Pt declines PT intervention, appears fatigued after recently returning to room from IR. PT will follow up as time allows.   Bernardino JINNY Ruth 04/03/2024, 11:38 AM

## 2024-04-03 NOTE — Progress Notes (Signed)
 A Left internal jugular temporary trialysis placed today in IR by Juliene Balder MD under fluoroscopy---confirmed via this doctors documentation in EPIC that this catheter is ready to use

## 2024-04-03 NOTE — Progress Notes (Signed)
 "                                                                                                                                                         Daily Progress Note   Patient Name: Deborah [name]       Date: 04/03/2024 DOB: Aug 20, 1965  Age: 59 y.o. MRN#: [id number] Attending Physician: Jillian Buttery, MD Primary Care Physician: Pcp, No Admit Date: 03/16/2024  Reason for Consultation/Follow-up: Establishing goals of care  Subjective: Patient is lethargic, occasionally nodding her head yes and no at times during my attempts at conversation and education.  No visitors were present.  Provided her with update on my interaction with her mother yesterday.  Provided review of her current care plan. She seems to understand there is overall decreased concern for necrotizing fasciitis per surgery's assessment. Encouraged her to continue to reflect on her priorities and options, whether quantity or quality of life, and to follow all recommendations for prolonging her life if this is truly her priority.  Objective:  Medical records reviewed including progress notes from 1/27 and labs.    Physical Exam Vitals and nursing note reviewed.  Constitutional:      General: She is not in acute distress.    Appearance: She is ill-appearing.     Interventions: Nasal cannula in place.  HENT:     Head: Normocephalic and atraumatic.  Cardiovascular:     Rate and Rhythm: Normal rate.  Pulmonary:     Effort: Pulmonary effort is normal. No respiratory distress.  Skin:    General: Skin is dry.     Findings: Rash present.  Neurological:     Mental Status: She is lethargic.            Vital Signs: BP 107/69   Pulse 78   Temp 97.8 F (36.6 C) (Oral)   Resp (!) 22   Ht 5' 6 (1.676 m)   Wt 96.5 kg   LMP 10/10/2012   SpO2 100%   BMI 34.34 kg/m  SpO2: SpO2: 100 % O2 Device: O2 Device: Room Air O2 Flow Rate: O2 Flow Rate (L/min): 3 L/min (For pt comfort during turn, peri care, wound care)       Palliative Assessment/Data: 30%   Palliative Care Assessment & Plan   Patient Profile: 59 y.o. female  with past medical history of PAF, right BKA, end-stage renal disease on dialysis, chronic pain in her abdomen and bilateral legs admitted on 03/16/2024 with syncopal episode and loss of consciousness at [facility].    In the ED, patient was found to have metabolic acidosis and lactic acid of 9, SIRS criteria, ultimately admitted for septic shock requiring pressors.  She was also recently hospitalized in December 2025 for sepsis secondary to left thigh abscess.  Chart review also shows that  she was discharged from physical medicine and rehabilitation clinic for her pain management in January 2025 due to positive drug screen via oral swab.  Patient reports she is now seen by [facility] pain clinic.  Refused HD today due to pain.   PMT has been consulted to assist with goals of care conversation.  She was last seen by PMT for goals of care discussion in December 2024.  Assessment: Goals of care conversation End-stage renal disease on dialysis Persistent bacteremia left thigh soft tissue infection  Recommendations/Plan: Continue full code/full scope treatment  Ongoing GOC discussions. Patient has not been ready for decision to de-escalate care though she historically refuses at times  Psychosocial and emotional support provided PMT will continue to follow and support intermittently   Prognosis: Poor  Discharge Planning: To Be Determined  Care plan was discussed with patient   Stefan Markarian SHAUNNA Fell, PA-C  Palliative Medicine Team Team phone # [phone]  Thank you for allowing the Palliative Medicine Team to assist in the care of this patient. Please utilize secure chat with additional questions, if there is no response within 30 minutes please call the above phone number.  Palliative Medicine Team providers are available by phone from 7am to 7pm daily and can be reached through the team  cell phone.  Should this patient require assistance outside of these hours, please call the patient's attending physician.    I personally spent a total of 25 minutes in the care of the patient today including preparing to see the patient, getting/reviewing separately obtained history, performing a medically appropriate exam/evaluation, counseling and educating, referring and communicating with other health care professionals, and documenting clinical information in the EHR.  "

## 2024-04-03 NOTE — Progress Notes (Signed)
 eLink Physician-Brief Progress Note Patient Name: Deborah [name] DOB: [birth year] MRN: [id number]   Date of Service  04/03/2024  HPI/Events of Note  Received query from RN regarding an order from 03/18/24 regarding removing all sources of heparin .  The patient at that time was anemic and thrombocytopenic. Hgb and platelets have been stable and the patient has been getting heparin  for DVT prophylaxis.   eICU Interventions  I think the order from 1/11 is no longer relevant and will be discontinued.  Continue heparin  for DVT prophylaxis.      Intervention Category Minor Interventions: Routine modifications to care plan (e.g. PRN medications for pain, fever)  Arlette Schaad 04/03/2024, 5:09 AM

## 2024-04-03 NOTE — Progress Notes (Signed)
" °   04/03/24 1800  Vitals  Temp 98.4 F (36.9 C)  Pulse Rate 88  Resp 20  BP (!) 104/35  SpO2 100 %  O2 Device Room Air  Type of Weight Post-Dialysis  Oxygen Therapy  Patient Activity (if Appropriate) In bed  Pulse Oximetry Type Continuous  Oximetry Probe Site Changed No  Post Treatment  Dialyzer Clearance Lightly streaked  Hemodialysis Intake (mL) 0 mL  Liters Processed 84  Fluid Removed (mL) 2000 mL  Tolerated HD Treatment Yes   Received patient in bed to unit.  Alert and oriented.  Informed consent signed and in chart.   TX duration:3.5  Patient tolerated well.  Transported back to the room  Alert, without acute distress.  Hand-off given to patient's nurse.   Access used: LIJ trialysis---new dressing reinforced Access issues: no complications  Total UF removed: 2000 Medication(s) given: atarax  25mg  po x 1 and mididrine 15mg  po x 1   Deborah [name] [facility] "

## 2024-04-03 NOTE — Progress Notes (Signed)
 "   2 Days Post-Op  Subjective: No new complaints this morning.  Eating her breakfast   Objective: Vital signs in last 24 hours: Temp:  [97.3 F (36.3 C)-100.6 F (38.1 C)] 97.8 F (36.6 C) (01/27 0754) Pulse Rate:  [78-105] 78 (01/27 0600) Resp:  [12-32] 22 (01/27 0600) BP: (97-120)/(59-70) 107/69 (01/27 0600) SpO2:  [96 %-100 %] 100 % (01/27 0600) Arterial Line BP: (126-130)/(52-55) 130/52 (01/26 1100) Last BM Date : 04/02/24  Intake/Output from previous day: 01/26 0701 - 01/27 0700 In: 1360 [P.O.:610; IV Piggyback:750] Out: -  Intake/Output this shift: No intake/output data recorded.  PE: Skin: Left thigh wound is clean with no further necrosis or infection  Lab Results:  Recent Labs    04/01/24 1902 04/02/24 0548  WBC 20.7* 19.0*  HGB 8.9* 8.7*  HCT 27.0* 24.9*  PLT 259 251   BMET Recent Labs    04/01/24 1902 04/02/24 0548  NA 140 139  K 3.7 3.8  CL 102 100  CO2 24 23  GLUCOSE 117* 99  BUN 23* 26*  CREATININE 4.09* 4.19*  CALCIUM  7.9* 8.1*   PT/INR No results for input(s): LABPROT, INR in the last 72 hours. CMP     Component Value Date/Time   NA 139 04/02/2024 0548   NA 137 09/20/2019 1530   K 3.8 04/02/2024 0548   CL 100 04/02/2024 0548   CO2 23 04/02/2024 0548   GLUCOSE 99 04/02/2024 0548   BUN 26 (H) 04/02/2024 0548   BUN 18 09/20/2019 1530   CREATININE 4.19 (H) 04/02/2024 0548   CALCIUM  8.1 (L) 04/02/2024 0548   PROT 4.9 (L) 04/01/2024 1902   ALBUMIN  3.4 (L) 04/01/2024 1902   AST 14 (L) 04/01/2024 1902   ALT 6 04/01/2024 1902   ALKPHOS 61 04/01/2024 1902   BILITOT 0.9 04/01/2024 1902   GFRNONAA 12 (L) 04/02/2024 0548   GFRAA 49 (L) 11/30/2019 0356   Lipase     Component Value Date/Time   LIPASE <10 (L) 04/01/2024 1902       Studies/Results: CT TIBIA FIBULA LEFT WO CONTRAST Result Date: 04/03/2024 EXAM: CT Left Lower Extremity, Without IV Contrast 04/02/2024 03:48:57 PM TECHNIQUE: Axial images were acquired through  the left lower extremity without IV contrast. Reformatted images were reviewed. Automated exposure control, iterative reconstruction, and/or weight based adjustment of the mA/kV was utilized to reduce the radiation dose to as low as reasonably achievable. COMPARISON: None available. CLINICAL HISTORY: Left calf edema, infection of left thigh debrided 01/25. FINDINGS: BONES AND JOINTS: Severe osteoarthritis of the left knee. There is at least a small left knee effusion. Expected small free osteochondral fragments posteriorly in the knee joint and then a small Baker cyst. Substantial osteoarthritis at the tibiotalar articulation with loss of articular space and spurring. Degenerative spurring along the Chopart joint and in the midfoot. Confluent subcutaneous edema superficial to the posteromedial margin of the calcaneus, there is some underlying spurring in this vicinity but no definite bony destructive findings to indicate osteomyelitis. No bony destructive findings characteristic of osteomyelitis. No acute fracture or focal osseous lesion. No dislocation. SOFT TISSUES: Atheromatous vascular calcifications. Posterior to the distal tibia for example on image 37 series 9, possibly from wound or ulceration. Subcutaneous edema in the calf becomes circumferential approaching the ankle what is primarily posterior and anteromedial elsewhere in the calf. Cellulitis is not excluded. Moderate regional muscular atrophy. No drainable abscess identified. Thickened medial band of the plantar fascia, cannot exclude plantar fasciitis. No findings  characteristic for abscess identified. IMPRESSION: 1. Subcutaneous edema in the calf, primarily posterior and anteromedial, becoming circumferential near the ankle, with possible cellulitis; no drainable abscess and no CT findings of osteomyelitis. 2. Soft tissue abnormality posterior to the distal tibia, consistent with wound or ulceration. 3. Severe osteoarthritis of the left knee with a  small effusion, small Baker cyst, and small posterior intra-articular osteochondral fragments. 4. Substantial osteoarthritis at the tibiotalar articulation with joint space loss and spurring. 5. Degenerative spurring along the Chopart joint and in the midfoot, with calcaneal spurring along the posteromedial margin. 6. Thickened medial band of the plantar fascia, possible plantar fasciitis. 7. Moderate regional muscular atrophy. 8. Atheromatous vascular calcifications. 9. No acute osseous abnormality. Electronically signed by: Ryan Salvage MD 04/03/2024 08:09 AM EST RP Workstation: [id number]   DG Chest Port 1 View Result Date: 04/01/2024 EXAM: 1 VIEW(S) XRAY OF THE CHEST 04/01/2024 03:42:00 PM COMPARISON: 04/01/2024 CLINICAL HISTORY: Encounter for central line placement. FINDINGS: LINES, TUBES AND DEVICES: Right IJ central line in place. Patient extubated. LUNGS AND PLEURA: Persistent low lung volumes. No focal pulmonary opacity. No pleural effusion. No pneumothorax. HEART AND MEDIASTINUM: No acute abnormality of the cardiac and mediastinal silhouettes. BONES AND SOFT TISSUES: Surgical clips in the right upper quadrant. Degenerative changes in the spine and shoulders. No acute osseous abnormality. IMPRESSION: 1. Right IJ central line in place. 2. No acute findings. Electronically signed by: Elsie Gravely MD 04/01/2024 03:48 PM EST RP Workstation: [id number]   DG Chest Port 1 View Result Date: 04/01/2024 EXAM: 1 VIEW XRAY OF THE CHEST 04/01/2024 02:19:00 PM COMPARISON: 03/31/2024 CLINICAL HISTORY: Encounter for central line placement. ICD10: 747705 Encounter for central line placement. FINDINGS: LINES, TUBES AND DEVICES: Endotracheal tube in place with tip 2.8 cm above the carina. Right IJ introducer sheath is noted with associated catheter with abrupt angulation over the upper SVC region, possibly extending in the azygos vein although not entirely specific. LUNGS AND PLEURA: Low lung volume. Mild left  mid and lower lung indistinct airspace opacity. Excluded right costophrenic angle. No pleural effusion. No pneumothorax. HEART AND MEDIASTINUM: No acute abnormality of the cardiac and mediastinal silhouettes. BONES AND SOFT TISSUES: Surgical clips in right upper quadrant, consistent with prior cholecystectomy. No acute osseous abnormality. IMPRESSION: 1. Right IJ introducer and associated catheter with abrupt angulation over the upper SVC region, possibly extending in the azygos vein. 2. Endotracheal tube tip 2.8 cm above the carina. 3. Mild left mid and lower lung indistinct airspace opacity. 4. Prior cholecystectomy. Electronically signed by: Ryan Salvage MD 04/01/2024 02:30 PM EST RP Workstation: [id number]    Anti-infectives: Anti-infectives (From admission, onward)    Start     Dose/Rate Route Frequency Ordered Stop   04/22/24 1000  vancomycin  (VANCOCIN ) 50 mg/mL oral solution SOLN 125 mg  Status:  Discontinued       Placed in Followed by Linked Group   125 mg Per Tube Every 3 DAYS 03/17/24 1020 03/17/24 1332   04/14/24 1000  vancomycin  (VANCOCIN ) 50 mg/mL oral solution SOLN 125 mg  Status:  Discontinued       Placed in Followed by Linked Group   125 mg Per Tube Every other day 03/17/24 1020 03/17/24 1332   04/07/24 1000  vancomycin  (VANCOCIN ) 50 mg/mL oral solution SOLN 125 mg  Status:  Discontinued       Placed in Followed by Linked Group   125 mg Per Tube Daily 03/17/24 1020 03/17/24 1332   04/02/24 1300  vancomycin  (VANCOCIN ) capsule  125 mg        125 mg Oral Daily 04/02/24 1157 04/22/24 2359   04/01/24 1130  linezolid  (ZYVOX ) IVPB 600 mg        600 mg 300 mL/hr over 60 Minutes Intravenous Every 12 hours 04/01/24 1055 04/15/24 2359   04/01/24 1030  vancomycin  (VANCOREADY) IVPB 1500 mg/300 mL  Status:  Discontinued        1,500 mg 150 mL/hr over 120 Minutes Intravenous  Once 04/01/24 0953 04/01/24 2252   03/31/24 1000  vancomycin  (VANCOCIN ) 50 mg/mL oral solution SOLN 125  mg  Status:  Discontinued       Placed in Followed by Linked Group   125 mg Per Tube 2 times daily 03/17/24 1020 03/17/24 1332   03/30/24 2200  ertapenem  (INVANZ ) 500 mg in sodium chloride  0.9 % 50 mL IVPB        500 mg 100 mL/hr over 30 Minutes Intravenous Every 24 hours 03/30/24 1520 04/13/24 2359   03/28/24 1800  fluconazole  (DIFLUCAN ) tablet 200 mg        200 mg Oral Weekly 03/28/24 1206 05/02/24 1759   03/21/24 2200  meropenem  (MERREM ) 1 g in sodium chloride  0.9 % 100 mL IVPB  Status:  Discontinued        1 g 200 mL/hr over 30 Minutes Intravenous Every 24 hours 03/20/24 1041 03/20/24 1153   03/20/24 2200  meropenem  (MERREM ) 1 g in sodium chloride  0.9 % 100 mL IVPB  Status:  Discontinued        1 g 200 mL/hr over 30 Minutes Intravenous Every 24 hours 03/20/24 1153 03/30/24 1519   03/19/24 1200  vancomycin  (VANCOCIN ) IVPB 1000 mg/200 mL premix  Status:  Discontinued        1,000 mg 200 mL/hr over 60 Minutes Intravenous  Once 03/19/24 0907 03/19/24 0907   03/19/24 1200  vancomycin  (VANCOCIN ) IVPB 1000 mg/200 mL premix  Status:  Discontinued        1,000 mg 200 mL/hr over 60 Minutes Intravenous Every 24 hours 03/19/24 0907 03/19/24 1110   03/17/24 2200  ceFEPIme  (MAXIPIME ) 2 g in sodium chloride  0.9 % 100 mL IVPB  Status:  Discontinued        2 g 200 mL/hr over 30 Minutes Intravenous Every 24 hours 03/17/24 0142 03/17/24 0732   03/17/24 1445  meropenem  (MERREM ) 1 g in sodium chloride  0.9 % 100 mL IVPB  Status:  Discontinued        1 g 200 mL/hr over 30 Minutes Intravenous Every 8 hours 03/17/24 1354 03/20/24 1153   03/17/24 1445  vancomycin  (VANCOCIN ) 50 mg/mL oral solution SOLN 125 mg       Placed in Or Linked Group   125 mg Per Tube Daily 03/17/24 1354 03/31/24 2359   03/17/24 1445  vancomycin  (VANCOCIN ) capsule 125 mg       Placed in Or Linked Group   125 mg Oral Daily 03/17/24 1354 03/31/24 2359   03/17/24 1445  vancomycin  (VANCOCIN ) IVPB 1000 mg/200 mL premix  Status:   Discontinued        1,000 mg 200 mL/hr over 60 Minutes Intravenous Every 24 hours 03/17/24 1354 03/18/24 0923   03/17/24 1430  fidaxomicin  (DIFICID ) tablet 200 mg  Status:  Discontinued        200 mg Tube 2 times daily 03/17/24 1332 03/17/24 1351   03/17/24 1115  vancomycin  (VANCOCIN ) capsule 125 mg  Status:  Discontinued        125 mg  Oral 4 times daily 03/17/24 1020 03/17/24 1354   03/17/24 1115  vancomycin  (VANCOCIN ) 50 mg/mL oral solution SOLN 125 mg  Status:  Discontinued       Placed in Followed by Linked Group   125 mg Per Tube 4 times daily 03/17/24 1020 03/17/24 1332   03/17/24 1000  meropenem  (MERREM ) 500 mg in sodium chloride  0.9 % 100 mL IVPB  Status:  Discontinued        500 mg 200 mL/hr over 30 Minutes Intravenous Every 24 hours 03/17/24 0732 03/17/24 1354   03/17/24 0145  metroNIDAZOLE  (FLAGYL ) IVPB 500 mg  Status:  Discontinued        500 mg 100 mL/hr over 60 Minutes Intravenous 2 times daily 03/17/24 0140 03/17/24 0732   03/16/24 2114  vancomycin  variable dose per unstable renal function (pharmacist dosing)  Status:  Discontinued         Does not apply See admin instructions 03/16/24 2114 03/17/24 1354   03/16/24 1945  ceFEPIme  (MAXIPIME ) 2 g in sodium chloride  0.9 % 100 mL IVPB        2 g 200 mL/hr over 30 Minutes Intravenous  Once 03/16/24 1934 03/16/24 2034   03/16/24 1930  vancomycin  (VANCOREADY) IVPB 2000 mg/400 mL        2,000 mg 200 mL/hr over 120 Minutes Intravenous  Once 03/16/24 1918 03/16/24 2247   03/16/24 1930  piperacillin -tazobactam (ZOSYN ) IVPB 4.5 g  Status:  Discontinued        4.5 g 200 mL/hr over 30 Minutes Intravenous  Once 03/16/24 1918 03/16/24 1934        Assessment/Plan POD 2, s/p I&D of left thigh soft tissue infection, Dr. Teresa 1/25 -wound is clean, continue dressing changes BID -LLE scan with edema and no evidence of infection -WOC RN consult appreciated -no evidence of infection or necrotizing soft tissue infection during OR.  Thin  layer of skin excised in OR, but no depth to wound.  Suspect this is more related to skin sloughing and changes from her diffuse excoriation, etc.   -defer abx therapy recs to ID, but thigh wound is clean with no further signs of infection -no further surgical needs.  Will arrange a wound check in the office in about 3-4 weeks.  We will sign off at this time.  FEN - renal VTE - heparin  ID - Invanz [email or web address]    LOS: 18 days    Burnard FORBES Banter , [facility] 04/03/2024, 8:12 AM Please see Amion for pager number during day hours 7:00am-4:30pm or 7:00am -11:30am on weekends  "

## 2024-04-03 NOTE — Procedures (Signed)
 Interventional Radiology Procedure:   Indications: ESRD and needs new temporary HD access  Procedure: Placement of left jugular non-tunneled dialysis catheter  Findings: Left jugular triple lumen dialysis catheter, tip at SVC/RA junction   Complications: None     EBL: Minimal  Plan: Catheter is ready to use.    Cassey Hurrell R. Philip, MD  Pager: [phone]

## 2024-04-03 NOTE — Discharge Instructions (Signed)
WOUND CARE: - midline dressing to be changed twice daily - supplies: sterile saline, gauze, scissors, tape  - remove dressing and all packing carefully, moistening with sterile saline as needed to avoid packing/internal dressing sticking to the wound. - clean edges of skin around the wound with water/gauze, making sure there is no tape debris or leakage left on skin that could cause skin irritation or breakdown. - dampen and clean gauze with sterile saline and pack wound from wound base to skin level, making sure to take note of any possible areas of wound tracking, tunneling and packing appropriately. Wound can be packed loosely. Trim gauze to size if a whole gauze is not required. - cover wound with a dry gauze and secure with tape.  - write the date/time on the dry dressing/tape to better track when the last dressing change occurred. - change dressing as needed if leakage occurs, wound gets contaminated, or patient requests to shower. - patient may shower daily with wound open (i.e. remove all packing) and following the shower the wound should be dried and a clean dressing placed.

## 2024-04-04 DIAGNOSIS — T80211D Bloodstream infection due to central venous catheter, subsequent encounter: Secondary | ICD-10-CM

## 2024-04-04 DIAGNOSIS — B961 Klebsiella pneumoniae [K. pneumoniae] as the cause of diseases classified elsewhere: Secondary | ICD-10-CM | POA: Diagnosis not present

## 2024-04-04 DIAGNOSIS — D72829 Elevated white blood cell count, unspecified: Secondary | ICD-10-CM | POA: Diagnosis not present

## 2024-04-04 DIAGNOSIS — R7881 Bacteremia: Secondary | ICD-10-CM | POA: Diagnosis not present

## 2024-04-04 DIAGNOSIS — R55 Syncope and collapse: Secondary | ICD-10-CM | POA: Diagnosis not present

## 2024-04-04 DIAGNOSIS — Z1612 Extended spectrum beta lactamase (ESBL) resistance: Secondary | ICD-10-CM | POA: Diagnosis not present

## 2024-04-04 LAB — GLUCOSE, CAPILLARY
Glucose-Capillary: 104 mg/dL — ABNORMAL HIGH (ref 70–99)
Glucose-Capillary: 76 mg/dL (ref 70–99)
Glucose-Capillary: 87 mg/dL (ref 70–99)
Glucose-Capillary: 80 mg/dL (ref 70–99)
Glucose-Capillary: 99 mg/dL (ref 70–99)

## 2024-04-04 MED ORDER — LINEZOLID 600 MG PO TABS
600.0000 mg | ORAL_TABLET | Freq: Two times a day (BID) | ORAL | Status: AC
  Administered 2024-04-04 – 2024-04-13 (×17): 600 mg via ORAL
  Filled 2024-04-04 (×18): qty 1

## 2024-04-04 MED ORDER — POTASSIUM CHLORIDE CRYS ER 20 MEQ PO TBCR
40.0000 meq | EXTENDED_RELEASE_TABLET | Freq: Once | ORAL | Status: AC
  Administered 2024-04-04: 40 meq via ORAL
  Filled 2024-04-04: qty 2

## 2024-04-04 MED ORDER — DARBEPOETIN ALFA 150 MCG/0.3ML IJ SOSY
150.0000 ug | PREFILLED_SYRINGE | INTRAMUSCULAR | Status: AC
  Administered 2024-04-04 – 2024-04-11 (×2): 150 ug via SUBCUTANEOUS
  Filled 2024-04-04 (×2): qty 0.3

## 2024-04-04 MED ORDER — TRAMADOL HCL 50 MG PO TABS
50.0000 mg | ORAL_TABLET | Freq: Two times a day (BID) | ORAL | Status: AC | PRN
  Administered 2024-04-07 – 2024-04-08 (×2): 50 mg via ORAL
  Filled 2024-04-04 (×6): qty 1

## 2024-04-04 MED ORDER — CALCIUM GLUCONATE-NACL 2-0.675 GM/100ML-% IV SOLN
2.0000 g | Freq: Once | INTRAVENOUS | Status: AC
  Administered 2024-04-04: 2000 mg via INTRAVENOUS
  Filled 2024-04-04 (×2): qty 100

## 2024-04-04 NOTE — Progress Notes (Signed)
 "                                                            [facility] Infectious Diseases Follow Up Note  Patient Identification: Patient Name: Deborah [name] MRN: [id number] Admit Date: 03/16/2024  4:42 PM Age: 59 y.o.Today's Date: 04/04/2024  Reason for Visit: Bacteremia, CLABSI  Principal Problem:   Syncope Active Problems:   Atrial fibrillation with RVR (HCC)-resolved   Hypotension   Bacteremia due to Klebsiella pneumoniae   Encephalopathy acute   Septic shock (HCC)   Encounter for continuous renal replacement therapy (CRRT) for acute renal failure   ESRD (end stage renal disease) (HCC)   Non-healing open wound of left groin   Sepsis due to Klebsiella (HCC)   ESBL (extended spectrum beta-lactamase) producing bacteria infection   Hemodialysis catheter infection   Bullous rash   Exfoliative dermatitis   Bacteremia   Intertrigo   Fever   Antibiotics: Ertapenem  1/23- Linezolid  1/25- Total days of antibiotics 20 P.o. vancomycin  for C. difficile prophylaxis  Lines/Hardwares: Left IJ HD catheter, Left AVF  Interval Events: Afebrile for more than 24 hours  Assessment 59 year old female with prior history as below including ESRD on HD admitted with ongoing fever, leukocytosis in the setting of  # ESBL Klebsiella pneumonia bacteremia/CLABSI -Right femoral CVC removed on 1/14 - HDC removed on 1/23 - Left IJ HDC placed on 1/27 - Right internal jugular introducer double-lumen removed on 1/28 - Unable to do line holiday due to difficulty getting blood cultures being a hard stick  # Suspected necrotizing fasciitis of left lower thigh, perineum -S/p I&D on 1/25.  No culture sent but no purulence or signs of necrotizing infection.  No further surgical plans per surgery.  # Leukocytosis -improving  Recommendations - Complete 14 days of IV ertapenem  from [facility] removal (1/23). EOT 2/6 - Complete 14 days of p.o. linezolid  from 1/25. EOT 3/7 - Monitor CBC and BMP on antibiotics -  WOC for left thigh wound  - Maintain contact precautions - Fu in clinic arranged 2/27 at 1: 15 pm.  ID will sign off  Rest of the management as per the primary team. Thank you for the consult. Please page with pertinent questions or concerns.  ______________________________________________________________________ Subjective patient seen and examined at the bedside.  Mild pain at her left thigh wound  Past Medical History:  Diagnosis Date   Acute back pain with sciatica, left    Acute back pain with sciatica, right    Acute encephalopathy 05/29/2022   Acute osteomyelitis of right calcaneus (HCC) 12/21/2022   Anemia, unspecified    Atrial fibrillation with RVR (HCC)-resolved 09/09/2022   Atypical chest pain 09/10/2021   Cancer [facility])    Carcinoid tumor of duodenum (HCC)    Chest pain with normal coronary angiography 2019   Chronic a-fib (HCC) 09/09/2022   Chronic pain    Chronic systolic CHF (congestive heart failure) (HCC)    Dehiscence of amputation stump of right lower extremity (HCC) 01/29/2023   Diabetes mellitus    DKA (diabetic ketoacidosis) (HCC)    Drug-seeking behavior    21 hospitalizations and 14 CT a/p in 2 years for N/V and abdominal pain, demanding only IV dilaudid    Elevated troponin    chronic   Esophageal reflux  Fibromyalgia    Gastric ulcer    Gastroparesis    Gout    HCAP (healthcare-associated pneumonia) 06/19/2022   Hyperlipidemia    Hyperosmolar hyperglycemic state (HHS) (HCC) 05/11/2022   Hyperosmolar non-ketotic state due to type 2 diabetes mellitus (HCC) 05/11/2022   Hypertension    Hypomagnesemia    Lumbosacral stenosis    LVH (left ventricular hypertrophy)    Morbid obesity (HCC)    Nausea & vomiting 09/09/2022   NICM (nonischemic cardiomyopathy) (HCC)    PAF (paroxysmal atrial fibrillation) (HCC)    Sepsis (HCC) 11/23/2022   Stroke (HCC) 02/2011   Symptomatic anemia 12/14/2022   Thrombocytosis    Vitamin B12 deficiency anemia     Past Surgical History:  Procedure Laterality Date   ABDOMINAL AORTOGRAM W/LOWER EXTREMITY N/A 11/29/2022   Procedure: ABDOMINAL AORTOGRAM W/LOWER EXTREMITY;  Surgeon: Pearline Norman RAMAN, MD;  [facility];  Service: Cardiovascular;  Laterality: N/A;   AMPUTATION Right 12/29/2022   Procedure: RIGHT BELOW KNEE AMPUTATION;  Surgeon: Harden Jerona GAILS, MD;  Location: [facility] OR;  Service: Orthopedics;  Laterality: Right;   AV FISTULA PLACEMENT Left 06/30/2022   Procedure: LEFT BRACHIOCEPHALIC ARTERIOVENOUS (AV) FISTULA CREATION;  Surgeon: Gretta Lonni PARAS, MD;  Location: [facility] OR;  Service: Vascular;  Laterality: Left;   BIOPSY  07/27/2019   Procedure: BIOPSY;  Surgeon: Rosalie Kitchens, MD;  Location: [facility] ENDOSCOPY;  Service: Endoscopy;;   BIOPSY  07/30/2019   Procedure: BIOPSY;  Surgeon: Elicia Claw, MD;  Location: [facility] ENDOSCOPY;  Service: Gastroenterology;;   BIOPSY  04/28/2023   Procedure: BIOPSY;  Surgeon: Federico Rosario BROCKS, MD;  Location: [facility] ENDOSCOPY;  Service: Gastroenterology;;   CATARACT EXTRACTION  01/2014   CHOLECYSTECTOMY     COLONOSCOPY WITH PROPOFOL  N/A 07/30/2019   Procedure: COLONOSCOPY WITH PROPOFOL ;  Surgeon: Elicia Claw, MD;  Location: [facility] ENDOSCOPY;  Service: Gastroenterology;  Laterality: N/A;   ESOPHAGOGASTRODUODENOSCOPY N/A 07/27/2019   Procedure: ESOPHAGOGASTRODUODENOSCOPY (EGD);  Surgeon: Rosalie Kitchens, MD;  Location: [facility];  Service: Endoscopy;  Laterality: N/A;   ESOPHAGOGASTRODUODENOSCOPY N/A 07/26/2020   Procedure: ESOPHAGOGASTRODUODENOSCOPY (EGD);  Surgeon: Burnette Fallow, MD;  Location: [facility];  Service: Endoscopy;  Laterality: N/A;   ESOPHAGOGASTRODUODENOSCOPY (EGD) WITH PROPOFOL  N/A 08/02/2019   Procedure: ESOPHAGOGASTRODUODENOSCOPY (EGD) WITH PROPOFOL ;  Surgeon: Elicia Claw, MD;  Location: [facility] ENDOS[facility];  Service: Gastroenterology;  Laterality: N/A;   ESOPHAGOGASTRODUODENOSCOPY (EGD) WITH PROPOFOL  N/A 12/23/2022   Procedure:  ESOPHAGOGASTRODUODENOSCOPY (EGD) WITH PROPOFOL ;  Surgeon: San Sandor GAILS, DO;  Location: [facility] ENDOS[facility];  Service: Gastroenterology;  Laterality: N/A;   ESOPHAGOGASTRODUODENOSCOPY (EGD) WITH PROPOFOL  N/A 04/28/2023   Procedure: ESOPHAGOGASTRODUODENOSCOPY (EGD) WITH PROPOFOL ;  Surgeon: Federico Rosario BROCKS, MD;  Location: [facility] ENDOSCOPY;  Service: Gastroenterology;  Laterality: N/A;   FISTULA SUPERFICIALIZATION Left 12/31/2022   Procedure: LEFT ARM FISTULA TRANSPOSITION;  Surgeon: Sheree Penne Lonni, MD;  Location: [facility] OR;  Service: Vascular;  Laterality: Left;   GIVENS CAPSULE STUDY N/A 12/23/2022   Procedure: GIVENS CAPSULE STUDY;  Surgeon: San Sandor GAILS, DO;  Location: [facility] ENDOS[facility];  Service: Gastroenterology;  Laterality: N/A;   HEMOSTASIS CLIP PLACEMENT  08/02/2019   Procedure: HEMOSTASIS CLIP PLACEMENT;  Surgeon: Elicia Claw, MD;  Location: [facility] ENDOSCOPY;  Service: Gastroenterology;;   HOT HEMOSTASIS N/A 12/23/2022   Procedure: HOT HEMOSTASIS (ARGON PLASMA COAGULATION/BICAP);  Surgeon: San Sandor GAILS, DO;  Location: [facility] ENDOSCOPY;  Service: Gastroenterology;  Laterality: N/A;   INCISION AND DRAINAGE ABSCESS Left 02/16/2024   Procedure: INCISION AND DRAINAGE, ABSCESS;  Surgeon: Vernetta Berg,  MD;  Location: [facility] OR;  Service: General;  Laterality: Left;  LEFT THIGH ABSCESS   IR FLUORO GUIDE CV LINE RIGHT  06/24/2022   IR REMOVAL TUN CV CATH W/O FL  03/30/2024   IR TUNNELED CENTRAL VENOUS CATH PLC W IMG  04/03/2024   IR US  GUIDE VASC ACCESS RIGHT  06/24/2022   IRRIGATION AND DEBRIDEMENT OF NECROTIZING SOFT TISSUE INFECTION Left 04/01/2024   Procedure: INCISION AND DEBRIDEMENT LEFT THIGH;  Surgeon: Teresa Lonni HERO, MD;  Location: [facility] OR;  Service: General;  Laterality: Left;   POLYPECTOMY  07/30/2019   Procedure: POLYPECTOMY;  Surgeon: Elicia Claw, MD;  Location: [facility] ENDOSCOPY;  Service: Gastroenterology;;   POLYPECTOMY  08/02/2019   Procedure: POLYPECTOMY;  Surgeon:  Elicia Claw, MD;  Location: [facility];  Service: Gastroenterology;;   HARLEY DILATION N/A 04/28/2023   Procedure: SAVORY DILATION;  Surgeon: Federico Rosario BROCKS, MD;  Location: [facility] ENDOSCOPY;  Service: Gastroenterology;  Laterality: N/A;   STUMP REVISION Right 02/02/2023   Procedure: REVISION RIGHT BELOW KNEE AMPUTATION;  Surgeon: Harden Jerona GAILS, MD;  Location: [facility] OR;  Service: Orthopedics;  Laterality: Right;   Vitals BP (!) 87/42 (BP Location: Right Arm)   Pulse 83   Temp 98.3 F (36.8 C) (Oral)   Resp 18   Ht 5' 6 (1.676 m)   Wt 96.5 kg   LMP 10/10/2012   SpO2 100%   BMI 34.34 kg/m     Physical Exam Constitutional:      Comments:   Cardiovascular:     Rate and Rhythm: Normal rate and regular rhythm.     Heart sounds: No murmur heard.   Pulmonary:     Effort: Pulmonary effort is normal.     Comments:   Abdominal:     Palpations: Abdomen is soft.     Tenderness:   Musculoskeletal:        General: Rt BKA  Skin:    Comments:   Neurological:     General: No focal deficit present.   Psychiatric:        Mood and Affect: Mood normal.   Pertinent Microbiology Results for orders placed or performed during the hospital encounter of 03/16/24  Blood culture (routine x 2)     Status: Abnormal   Collection Time: 03/16/24  6:00 PM   Specimen: BLOOD  Result Value Ref Range Status   Specimen Description BLOOD RIGHT ANTECUBITAL  Final   Special Requests   Final    BOTTLES DRAWN AEROBIC AND ANAEROBIC Blood Culture adequate volume   Culture  Setup Time   Final    GRAM NEGATIVE RODS IN BOTH AEROBIC AND ANAEROBIC BOTTLES CRITICAL RESULT CALLED TO, READ BACK BY AND VERIFIED WITH: PHARMD ABBOTT 03/17/2024 @ 0721 BY AB    Culture (A)  Final    KLEBSIELLA PNEUMONIAE BACILLUS SPECIES Standardized susceptibility testing for this organism is not available. CRITICAL RESULT CALLED TO, READ BACK BY AND VERIFIED WITH: MAYA SKATES PAYTES [id number] AT 1832 BY EC Performed at  [facility] Lab, [address], [place], [place] [address]    Report Status 03/19/2024 FINAL  Final  Blood Culture ID Panel (Reflexed)     Status: Abnormal   Collection Time: 03/16/24  6:00 PM  Result Value Ref Range Status   Enterococcus faecalis NOT DETECTED NOT DETECTED Final   Enterococcus Faecium NOT DETECTED NOT DETECTED Final   Listeria monocytogenes NOT DETECTED NOT DETECTED Final   Staphylococcus species NOT DETECTED NOT DETECTED Final  Staphylococcus aureus (BCID) NOT DETECTED NOT DETECTED Final   Staphylococcus epidermidis NOT DETECTED NOT DETECTED Final   Staphylococcus lugdunensis NOT DETECTED NOT DETECTED Final   Streptococcus species NOT DETECTED NOT DETECTED Final   Streptococcus agalactiae NOT DETECTED NOT DETECTED Final   Streptococcus pneumoniae NOT DETECTED NOT DETECTED Final   Streptococcus pyogenes NOT DETECTED NOT DETECTED Final   A.calcoaceticus-baumannii NOT DETECTED NOT DETECTED Final   Bacteroides fragilis NOT DETECTED NOT DETECTED Final   Enterobacterales DETECTED (A) NOT DETECTED Final    Comment: Enterobacterales represent a large order of gram negative bacteria, not a single organism. CRITICAL RESULT CALLED TO, READ BACK BY AND VERIFIED WITH: PHARMD ABBOTT 03/17/2024 @ 0721 BY AB    Enterobacter cloacae complex NOT DETECTED NOT DETECTED Final   Escherichia coli NOT DETECTED NOT DETECTED Final   Klebsiella aerogenes NOT DETECTED NOT DETECTED Final   Klebsiella oxytoca NOT DETECTED NOT DETECTED Final   Klebsiella pneumoniae DETECTED (A) NOT DETECTED Final    Comment: CRITICAL RESULT CALLED TO, READ BACK BY AND VERIFIED WITH: PHARMD ABBOTT 03/17/2024 @ 0721 BY AB    Proteus species NOT DETECTED NOT DETECTED Final   Salmonella species NOT DETECTED NOT DETECTED Final   Serratia marcescens NOT DETECTED NOT DETECTED Final   Haemophilus influenzae NOT DETECTED NOT DETECTED Final   Neisseria meningitidis NOT DETECTED NOT DETECTED Final   Pseudomonas  aeruginosa NOT DETECTED NOT DETECTED Final   Stenotrophomonas maltophilia NOT DETECTED NOT DETECTED Final   Candida albicans NOT DETECTED NOT DETECTED Final   Candida auris NOT DETECTED NOT DETECTED Final   Candida glabrata NOT DETECTED NOT DETECTED Final   Candida krusei NOT DETECTED NOT DETECTED Final   Candida parapsilosis NOT DETECTED NOT DETECTED Final   Candida tropicalis NOT DETECTED NOT DETECTED Final   Cryptococcus neoformans/gattii NOT DETECTED NOT DETECTED Final   CTX-M ESBL DETECTED (A) NOT DETECTED Final    Comment: CRITICAL RESULT CALLED TO, READ BACK BY AND VERIFIED WITH: PHARMD ABBOTT 03/17/2024 @ 0721 BY AB (NOTE) Extended spectrum beta-lactamase detected. Recommend a carbapenem as initial therapy.      Carbapenem resistance IMP NOT DETECTED NOT DETECTED Final   Carbapenem resistance KPC NOT DETECTED NOT DETECTED Final   Carbapenem resistance NDM NOT DETECTED NOT DETECTED Final   Carbapenem resist OXA 48 LIKE NOT DETECTED NOT DETECTED Final   Carbapenem resistance VIM NOT DETECTED NOT DETECTED Final    Comment: Performed at [facility], [address], [place], [place] [address]  Blood culture (routine x 2)     Status: Abnormal   Collection Time: 03/16/24  7:49 PM   Specimen: BLOOD  Result Value Ref Range Status   Specimen Description BLOOD RIGHT ANTECUBITAL  Final   Special Requests   Final    BOTTLES DRAWN AEROBIC AND ANAEROBIC Blood Culture results may not be optimal due to an inadequate volume of blood received in culture bottles   Culture  Setup Time   Final    GRAM NEGATIVE RODS IN BOTH AEROBIC AND ANAEROBIC BOTTLES CRITICAL VALUE NOTED.  VALUE IS CONSISTENT WITH PREVIOUSLY REPORTED AND CALLED VALUE. Performed at [facility], [address], [place], [place] [address]    Culture (A)  Final    KLEBSIELLA PNEUMONIAE Confirmed Extended Spectrum Beta-Lactamase Producer (ESBL).  In bloodstream infections from ESBL organisms, carbapenems are  preferred over piperacillin /tazobactam. They are shown to have a lower risk of mortality.    Report Status 03/19/2024 FINAL  Final  Organism ID, Bacteria KLEBSIELLA PNEUMONIAE  Final      Susceptibility   Klebsiella pneumoniae - MIC*    AMPICILLIN >=32 RESISTANT Resistant     CEFAZOLIN  (NON-URINE) >=32 RESISTANT Resistant     CEFEPIME  >=32 RESISTANT Resistant     ERTAPENEM  <=0.12 SENSITIVE Sensitive     CEFTRIAXONE  >=64 RESISTANT Resistant     CIPROFLOXACIN  >=4 RESISTANT Resistant     GENTAMICIN <=1 SENSITIVE Sensitive     MEROPENEM  <=0.25 SENSITIVE Sensitive     TRIMETH/SULFA >=320 RESISTANT Resistant     AMPICILLIN/SULBACTAM >=32 RESISTANT Resistant     PIP/TAZO Value in next row Intermediate      64 INTERMEDIATEThis is a modified FDA-approved test that has been validated and its performance characteristics determined by the reporting laboratory.  This laboratory is certified under the Clinical Laboratory Improvement Amendments CLIA as qualified to perform high complexity clinical laboratory testing.    * KLEBSIELLA PNEUMONIAE  MRSA Next Gen by PCR, Nasal     Status: None   Collection Time: 03/17/24  1:10 AM   Specimen: Nasal Mucosa; Nasal Swab  Result Value Ref Range Status   MRSA by PCR Next Gen NOT DETECTED NOT DETECTED Final    Comment: (NOTE) The GeneXpert MRSA Assay (FDA approved for NASAL specimens only), is one component of a comprehensive MRSA colonization surveillance program. It is not intended to diagnose MRSA infection nor to guide or monitor treatment for MRSA infections. Test performance is not FDA approved in patients less than 37 years old. Performed at [facility] Lab, [address], [place], [place] [address]   C Difficile Quick Screen w PCR reflex     Status: None   Collection Time: 03/17/24 10:19 AM   Specimen: STOOL  Result Value Ref Range Status   C Diff antigen NEGATIVE NEGATIVE Final   C Diff toxin NEGATIVE NEGATIVE Final   C Diff interpretation  No C. difficile detected.  Final    Comment: Performed at [facility] Lab, [address], [place], [place] [address]   Pertinent Lab.    Latest Ref Rng & Units 04/03/2024   12:00 PM 04/02/2024    5:48 AM 04/01/2024    7:02 PM  CBC  WBC 4.0 - 10.5 K/uL 14.3  19.0  20.7   Hemoglobin 12.0 - 15.0 g/dL 8.6  8.7  8.9   Hematocrit 36.0 - 46.0 % 25.3  24.9  27.0   Platelets 150 - 400 K/uL 237  251  259       Latest Ref Rng & Units 04/03/2024   12:00 PM 04/02/2024    5:48 AM 04/01/2024    7:02 PM  CMP  Glucose 70 - 99 mg/dL 854  99  882   BUN 6 - 20 mg/dL 32  26  23   Creatinine 0.44 - 1.00 mg/dL 4.95  5.80  5.90   Sodium 135 - 145 mmol/L 139  139  140   Potassium 3.5 - 5.1 mmol/L 3.1  3.8  3.7   Chloride 98 - 111 mmol/L 101  100  102   CO2 22 - 32 mmol/L 23  23  24    Calcium  8.9 - 10.3 mg/dL 8.2  8.1  7.9   Total Protein 6.5 - 8.1 g/dL   4.9   Total Bilirubin 0.0 - 1.2 mg/dL   0.9   Alkaline Phos 38 - 126 U/L   61   AST 15 - 41 U/L   14  ALT 0 - 44 U/L   6      Pertinent Imaging today Plain films and CT images have been personally visualized and interpreted; radiology reports have been reviewed. Decision making incorporated into the Impression  IR NON-TUNNELED CENTRAL VENOUS CATH [facility] W IMG Result Date: 04/03/2024 INDICATION: End-stage renal disease. Tunneled dialysis catheter was removed on 03/30/2022 due to bacteremia. Patient needs new temporary dialysis access. EXAM: FLUOROSCOPIC AND ULTRASOUND GUIDED PLACEMENT OF A NON-TUNNELED DIALYSIS CATHETER Physician: Juliene SAUNDERS. Henn, MD MEDICATIONS: 1% lidocaine  for local anesthetic ANESTHESIA/SEDATION: None FLUOROSCOPY TIME:  Radiation Exposure Index (as provided by the fluoroscopic device): 7 mGy Kerma COMPLICATIONS: None immediate. PROCEDURE: Informed consent was obtained for catheter placement. The patient was placed supine on the interventional table. Ultrasound confirmed a patent left internal jugular vein. Ultrasound images were obtained  for documentation. The left neck was prepped and draped in a sterile fashion. Maximal barrier sterile technique was utilized including caps, mask, sterile gowns, sterile gloves, sterile drape, hand hygiene and skin antiseptic. The right neck was anesthetized with 1% lidocaine . A small incision was made with #11 blade scalpel. A 21 gauge needle directed into the left internal jugular vein with ultrasound guidance. A micropuncture dilator set was placed. J wire was placed and a Kumpe catheter was advanced to the IVC. J wire was exchanged for a superstiff Amplatz wire. A 20 cm Mahurkar catheter was selected. The catheter was advanced over a wire and positioned at the superior cavoatrial junction. Fluoroscopic images were obtained for documentation. Both dialysis lumens were found to aspirate and flush well. The proper amount of heparin  was flushed in both lumens. The central venous lumen was flushed with normal saline. Catheter was sutured to skin. FINDINGS: Catheter tip at the superior cavoatrial junction. IMPRESSION: Successful placement of a left jugular non-tunneled dialysis catheter using ultrasound and fluoroscopic guidance. Electronically Signed   By: Juliene Balder M.D.   On: 04/03/2024 10:09   CT TIBIA FIBULA LEFT WO CONTRAST Result Date: 04/03/2024 EXAM: CT Left Lower Extremity, Without IV Contrast 04/02/2024 03:48:57 PM TECHNIQUE: Axial images were acquired through the left lower extremity without IV contrast. Reformatted images were reviewed. Automated exposure control, iterative reconstruction, and/or weight based adjustment of the mA/kV was utilized to reduce the radiation dose to as low as reasonably achievable. COMPARISON: None available. CLINICAL HISTORY: Left calf edema, infection of left thigh debrided 01/25. FINDINGS: BONES AND JOINTS: Severe osteoarthritis of the left knee. There is at least a small left knee effusion. Expected small free osteochondral fragments posteriorly in the knee joint and  then a small Baker cyst. Substantial osteoarthritis at the tibiotalar articulation with loss of articular space and spurring. Degenerative spurring along the Chopart joint and in the midfoot. Confluent subcutaneous edema superficial to the posteromedial margin of the calcaneus, there is some underlying spurring in this vicinity but no definite bony destructive findings to indicate osteomyelitis. No bony destructive findings characteristic of osteomyelitis. No acute fracture or focal osseous lesion. No dislocation. SOFT TISSUES: Atheromatous vascular calcifications. Posterior to the distal tibia for example on image 37 series 9, possibly from wound or ulceration. Subcutaneous edema in the calf becomes circumferential approaching the ankle what is primarily posterior and anteromedial elsewhere in the calf. Cellulitis is not excluded. Moderate regional muscular atrophy. No drainable abscess identified. Thickened medial band of the plantar fascia, cannot exclude plantar fasciitis. No findings characteristic for abscess identified. IMPRESSION: 1. Subcutaneous edema in the calf, primarily posterior and anteromedial, becoming circumferential near the ankle,  with possible cellulitis; no drainable abscess and no CT findings of osteomyelitis. 2. Soft tissue abnormality posterior to the distal tibia, consistent with wound or ulceration. 3. Severe osteoarthritis of the left knee with a small effusion, small Baker cyst, and small posterior intra-articular osteochondral fragments. 4. Substantial osteoarthritis at the tibiotalar articulation with joint space loss and spurring. 5. Degenerative spurring along the Chopart joint and in the midfoot, with calcaneal spurring along the posteromedial margin. 6. Thickened medial band of the plantar fascia, possible plantar fasciitis. 7. Moderate regional muscular atrophy. 8. Atheromatous vascular calcifications. 9. No acute osseous abnormality. Electronically signed by: Ryan Salvage MD  04/03/2024 08:09 AM EST RP Workstation: [id number]   DG Chest Port 1 View Result Date: 04/01/2024 EXAM: 1 VIEW(S) XRAY OF THE CHEST 04/01/2024 03:42:00 PM COMPARISON: 04/01/2024 CLINICAL HISTORY: Encounter for central line placement. FINDINGS: LINES, TUBES AND DEVICES: Right IJ central line in place. Patient extubated. LUNGS AND PLEURA: Persistent low lung volumes. No focal pulmonary opacity. No pleural effusion. No pneumothorax. HEART AND MEDIASTINUM: No acute abnormality of the cardiac and mediastinal silhouettes. BONES AND SOFT TISSUES: Surgical clips in the right upper quadrant. Degenerative changes in the spine and shoulders. No acute osseous abnormality. IMPRESSION: 1. Right IJ central line in place. 2. No acute findings. Electronically signed by: Elsie Gravely MD 04/01/2024 03:48 PM EST RP Workstation: [id number]   DG Chest Port 1 View Result Date: 04/01/2024 EXAM: 1 VIEW XRAY OF THE CHEST 04/01/2024 02:19:00 PM COMPARISON: 03/31/2024 CLINICAL HISTORY: Encounter for central line placement. ICD10: [id number] Encounter for central line placement. FINDINGS: LINES, TUBES AND DEVICES: Endotracheal tube in place with tip 2.8 cm above the carina. Right IJ introducer sheath is noted with associated catheter with abrupt angulation over the upper SVC region, possibly extending in the azygos vein although not entirely specific. LUNGS AND PLEURA: Low lung volume. Mild left mid and lower lung indistinct airspace opacity. Excluded right costophrenic angle. No pleural effusion. No pneumothorax. HEART AND MEDIASTINUM: No acute abnormality of the cardiac and mediastinal silhouettes. BONES AND SOFT TISSUES: Surgical clips in right upper quadrant, consistent with prior cholecystectomy. No acute osseous abnormality. IMPRESSION: 1. Right IJ introducer and associated catheter with abrupt angulation over the upper SVC region, possibly extending in the azygos vein. 2. Endotracheal tube tip 2.8 cm above the carina. 3. Mild left  mid and lower lung indistinct airspace opacity. 4. Prior cholecystectomy. Electronically signed by: Ryan Salvage MD 04/01/2024 02:30 PM EST RP Workstation: [id number]   DG Chest Port 1 View Result Date: 03/31/2024 EXAM: 1 VIEW(S) XRAY OF THE CHEST 03/31/2024 07:31:00 AM COMPARISON: 03/25/2024 CLINICAL HISTORY: Fever. FINDINGS: LINES, TUBES AND DEVICES: Right IJ central venous catheter removed. LUNGS AND PLEURA: Low lung volumes. Vague left perihilar airspace opacities. No pleural effusion. No pneumothorax. HEART AND MEDIASTINUM: No acute abnormality of the cardiac and mediastinal silhouettes. BONES AND SOFT TISSUES: No acute osseous abnormality. IMPRESSION: 1. Vague left perihilar airspace opacities, which may reflect atelectasis related to low lung volumes versus developing infection. Electronically signed by: Dayne Hassell MD 03/31/2024 03:18 PM EST RP Workstation: [id number]   IR Removal Tun Cv Cath W/O FL Result Date: 03/30/2024 INDICATION: History of ESRD requiring hemodialysis. Previous tunneled HD catheter placed 06/24/2022. Currently admitted with bacteremia. Request for tunneled HD catheter removal for line holiday. EXAM: REMOVAL TUNNELED CENTRAL VENOUS CATHETER MEDICATIONS: 3 mL 1% lidocaine  ANESTHESIA/SEDATION: None FLUOROSCOPY: None COMPLICATIONS: None immediate. PROCEDURE: Informed written consent was obtained from the patient after a thorough discussion of the procedural risks,  benefits and alternatives. All questions were addressed. Maximal Sterile Barrier Technique was utilized including caps, mask, sterile gowns, sterile gloves, sterile drape, hand hygiene and skin antiseptic. A timeout was performed prior to the initiation of the procedure. The patient's right chest and catheter was prepped and draped in a normal sterile fashion. Heparin  was removed from both ports of catheter. 1% lidocaine  was used for local anesthesia. Using gentle blunt dissection the cuff of the catheter was exposed  and the catheter was removed in it's entirety. Pressure was held till hemostasis was obtained. A sterile dressing was applied. The patient tolerated the procedure well with no immediate complications. IMPRESSION: Successful catheter removal as described above. Performed by: Wyatt Pommier, PA-C Electronically Signed   By: CHRISTELLA.  Shick M.D.   On: 03/30/2024 16:04   DG Chest Port 1 View Result Date: 03/25/2024 CLINICAL DATA:  [name]. EXAM: PORTABLE CHEST 1 VIEW COMPARISON:  Chest radiograph dated 03/22/2024. FINDINGS: Right-sided catheter in similar position. Interval removal of the feeding tube. Bilateral interstitial densities similar to prior radiograph and may represent chronic changes, reactive airway disease, edema, or atypical pneumonia. No focal consolidation, pleural effusion, or pneumothorax. Stable cardiac silhouette. No acute osseous pathology. IMPRESSION: No significant interval change. Electronically Signed   By: Vanetta Chou M.D.   On: 03/25/2024 16:34   DG Chest Port 1 View Result Date: 03/22/2024 EXAM: 1 VIEW(S) XRAY OF THE CHEST 03/22/2024 12:07:00 PM COMPARISON: 03/16/2024 CLINICAL HISTORY: SOB (shortness of breath) FINDINGS: LINES, TUBES AND DEVICES: Right chest wall dual lumen central venous catheter in place with tip terminating over the superior right atrium. Enteric tube in place coursing through the chest to the abdomen beyond the field-of-view. LUNGS AND PLEURA: Low lung volumes. No focal pulmonary opacity. No pleural effusion. No pneumothorax. HEART AND MEDIASTINUM: No acute abnormality of the cardiac and mediastinal silhouettes. BONES AND SOFT TISSUES: Degenerative changes of bilateral shoulders. Bilateral high-riding humeral heads with significant decrease in acromiohumeral interval. No acute osseous abnormality. IMPRESSION: 1. No acute findings. 2. Low lung volumes. 3. Advanced arthropathic changes involving both shoulders with suspected bilateral rotator cuff pathology  resulting in high-riding humeral heads. Electronically signed by: Waddell Calk MD 03/22/2024 03:47 PM EST RP Workstation: [id number]   ECHOCARDIOGRAM LIMITED Result Date: 03/18/2024    ECHOCARDIOGRAM LIMITED REPORT   Patient Name:   JUPITER BOYS Date of Exam: 03/18/2024 Medical Rec #:  [id number]            Height:       66.0 in Accession #:    [id number]           Weight:       227.3 lb Date of Birth:  1965-11-19            BSA:          2.112 m Patient Age:    58 years             BP:           112/46 mmHg Patient Gender: F                    HR:           93 bpm. Exam Location:  Inpatient Procedure: Limited Echo, Cardiac Doppler and Limited Color Doppler (Both            Spectral and Color Flow Doppler were utilized during procedure). Indications:    Shock  History:  Patient has prior history of Echocardiogram examinations, most                 recent 03/17/2024. CHF, Abnormal ECG, Stroke, Arrythmias:Atrial                 Fibrillation; Risk Factors:Hypertension and Diabetes.  Sonographer:    Sherlean Dubin Referring Phys: [id number] TORIBIO JAYSON SHARPS  Sonographer Comments: Image acquisition challenging due to patient body habitus and Image acquisition challenging due to respiratory motion. IMPRESSIONS  1. Left ventricular ejection fraction, by estimation, is 60 to 65%. The left ventricle has normal function. There is mild concentric left ventricular hypertrophy. Left ventricular diastolic parameters were normal.  2. Basal and apical function preserved but unable to assess free wall function due to poor visualization. Right ventricular systolic function was not well visualized. The right ventricular size is not well visualized.  3. There is a small pericardial effusion located posteriorly (#21) . a small pericardial effusion is present. The pericardial effusion is posterior to the left ventricle.  4. The mitral valve is normal in structure. No evidence of mitral valve regurgitation. No evidence of  mitral stenosis.  5. Calcified nodule in left coronary cusp. The aortic valve is abnormal. Aortic valve regurgitation is not visualized. No aortic stenosis is present.  6. The inferior vena cava is dilated in size with <50% respiratory variability, suggesting right atrial pressure of 15 mmHg. Comparison(s): No significant change from prior study. Prior images reviewed side by side. FINDINGS  Left Ventricle: Left ventricular ejection fraction, by estimation, is 60 to 65%. The left ventricle has normal function. The left ventricular internal cavity size was normal in size. There is mild concentric left ventricular hypertrophy. Left ventricular diastolic parameters were normal. Right Ventricle: Basal and apical function preserved but unable to assess free wall function due to poor visualization. The right ventricular size is not well visualized. Right vetricular wall thickness was not well visualized. Right ventricular systolic  function was not well visualized. Left Atrium: Left atrial size was normal in size. Right Atrium: Right atrial size was normal in size. Pericardium: There is a small pericardial effusion located posteriorly (#21). A small pericardial effusion is present. The pericardial effusion is posterior to the left ventricle. Mitral Valve: The mitral valve is normal in structure. No evidence of mitral valve stenosis. Tricuspid Valve: The tricuspid valve is not well visualized. Tricuspid valve regurgitation is not demonstrated. No evidence of tricuspid stenosis. Aortic Valve: Calcified nodule in left coronary cusp. The aortic valve is abnormal. Aortic valve regurgitation is not visualized. No aortic stenosis is present. Venous: The inferior vena cava is dilated in size with less than 50% respiratory variability, suggesting right atrial pressure of 15 mmHg. IAS/Shunts: The interatrial septum was not well visualized. LEFT VENTRICLE PLAX 2D LVIDd:         5.20 cm   Diastology LVIDs:         3.50 cm   LV e'  medial:    6.64 cm/s LV PW:         1.10 cm   LV E/e' medial:  13.3 LV IVS:        1.00 cm   LV e' lateral:   12.70 cm/s LVOT diam:     2.30 cm   LV E/e' lateral: 6.9 LV SV:         86 LV SV Index:   41 LVOT Area:     4.15 cm  RIGHT VENTRICLE  IVC RV S prime:     11.90 cm/s  IVC diam: 2.20 cm LEFT ATRIUM         Index LA diam:    4.10 cm 1.94 cm/m  AORTIC VALVE LVOT Vmax:   99.60 cm/s LVOT Vmean:  66.000 cm/s LVOT VTI:    0.208 m  AORTA Ao Root diam: 3.30 cm MITRAL VALVE MV Area (PHT): 5.27 cm    SHUNTS MV Decel Time: 144 msec    Systemic VTI:  0.21 m MV E velocity: 88.00 cm/s  Systemic Diam: 2.30 cm MV A velocity: 99.00 cm/s MV E/A ratio:  0.89 Franck Azobou Tonleu Electronically signed by Joelle Cedars Tonleu Signature Date/Time: 03/18/2024/10:27:49 AM    Final    ECHOCARDIOGRAM COMPLETE Result Date: 03/17/2024    ECHOCARDIOGRAM REPORT   Patient Name:   STARIA BIRKHEAD Date of Exam: 03/17/2024 Medical Rec #:  [id number]            Height:       66.0 in Accession #:    [id number]           Weight:       227.3 lb Date of Birth:  12-30-1965            BSA:          2.112 m Patient Age:    58 years             BP:           77/44 mmHg Patient Gender: F                    HR:           117 bpm. Exam Location:  Inpatient Procedure: 2D Echo, Cardiac Doppler and Color Doppler (Both Spectral and Color            Flow Doppler were utilized during procedure). STAT ECHO Indications:    Shock. R55 Syncope  History:        Patient has prior history of Echocardiogram examinations, most                 recent 09/16/2022. CHF, Abnormal ECG, Stroke, Arrythmias:Atrial                 Fibrillation, Signs/Symptoms:Altered Mental Status and                 Bacteremia; Risk Factors:Hypertension and Diabetes. ESRD.  Sonographer:    Ellouise Mose RDCS Referring Phys: [id number] JAMIE H DAGENHART  Sonographer Comments: Technically difficult study due to poor echo windows and patient is obese. Image acquisition challenging due to  patient body habitus. Patient soiled, patient screams with pressure of probe. IMPRESSIONS  1. Left ventricular ejection fraction, by estimation, is 60 to 65%. The left ventricle has normal function. The left ventricle has no regional wall motion abnormalities. There is mild left ventricular hypertrophy. Indeterminate diastolic filling due to E-A fusion.  2. Right ventricular systolic function is moderately reduced. The right ventricular size is mildly enlarged. There is normal pulmonary artery systolic pressure. The estimated right ventricular systolic pressure is 19.8 mmHg.  3. The mitral valve is normal in structure. No evidence of mitral valve regurgitation. No evidence of mitral stenosis.  4. Nodular calcification of the left coronary cusp . The aortic valve is tricuspid. There is mild calcification of the aortic valve. Aortic valve regurgitation is not visualized. Aortic valve sclerosis is present, with no evidence of aortic valve stenosis.  5. The inferior vena cava is normal in size with greater than 50% respiratory variability, suggesting right atrial pressure of 3 mmHg. FINDINGS  Left Ventricle: Left ventricular ejection fraction, by estimation, is 60 to 65%. The left ventricle has normal function. The left ventricle has no regional wall motion abnormalities. The left ventricular internal cavity size was normal in size. There is  mild left ventricular hypertrophy. Indeterminate diastolic filling due to E-A fusion. Right Ventricle: The right ventricular size is mildly enlarged. No increase in right ventricular wall thickness. Right ventricular systolic function is moderately reduced. There is normal pulmonary artery systolic pressure. The tricuspid regurgitant velocity is 2.05 m/s, and with an assumed right atrial pressure of 3 mmHg, the estimated right ventricular systolic pressure is 19.8 mmHg. Left Atrium: Left atrial size was normal in size. Right Atrium: Right atrial size was normal in size.  Pericardium: There is no evidence of pericardial effusion. Mitral Valve: The mitral valve is normal in structure. No evidence of mitral valve regurgitation. No evidence of mitral valve stenosis. Tricuspid Valve: The tricuspid valve is normal in structure. Tricuspid valve regurgitation is mild . No evidence of tricuspid stenosis. Aortic Valve: Nodular calcification of the left coronary cusp. The aortic valve is tricuspid. There is mild calcification of the aortic valve. Aortic valve regurgitation is not visualized. Aortic valve sclerosis is present, with no evidence of aortic valve stenosis. Pulmonic Valve: The pulmonic valve was normal in structure. Pulmonic valve regurgitation is not visualized. No evidence of pulmonic stenosis. Aorta: The aortic root and ascending aorta are structurally normal, with no evidence of dilitation. Venous: The inferior vena cava is normal in size with greater than 50% respiratory variability, suggesting right atrial pressure of 3 mmHg. IAS/Shunts: No atrial level shunt detected by color flow Doppler. Additional Comments: 3D was performed not requiring image post processing on an independent workstation and was indeterminate.  LEFT VENTRICLE PLAX 2D LVIDd:         4.50 cm     Diastology LVIDs:         3.10 cm     LV e' medial:    11.10 cm/s LV PW:         1.40 cm     LV E/e' medial:  9.3 LV IVS:        1.20 cm     LV e' lateral:   15.00 cm/s LVOT diam:     2.40 cm     LV E/e' lateral: 6.9 LV SV:         84 LV SV Index:   40 LVOT Area:     4.52 cm  LV Volumes (MOD) LV vol d, MOD A2C: 68.1 ml LV vol d, MOD A4C: 78.1 ml LV vol s, MOD A2C: 21.7 ml LV vol s, MOD A4C: 27.2 ml LV SV MOD A2C:     46.4 ml LV SV MOD A4C:     78.1 ml LV SV MOD BP:      48.5 ml RIGHT VENTRICLE             IVC RV S prime:     12.40 cm/s  IVC diam: 2.00 cm TAPSE (M-mode): 1.6 cm LEFT ATRIUM             Index        RIGHT ATRIUM           Index LA diam:        3.00 cm 1.42 cm/m   RA Area:  12.70 cm LA Vol (A2C):    32.6 ml 15.44 ml/m  RA Volume:   29.90 ml  14.16 ml/m LA Vol (A4C):   40.1 ml 18.99 ml/m LA Biplane Vol: 36.9 ml 17.48 ml/m  AORTIC VALVE LVOT Vmax:   130.00 cm/s LVOT Vmean:  85.900 cm/s LVOT VTI:    0.186 m  AORTA Ao Root diam: 3.40 cm MITRAL VALVE                TRICUSPID VALVE MV Area (PHT): 5.54 cm     TR Peak grad:   16.8 mmHg MV Decel Time: 137 msec     TR Vmax:        205.00 cm/s MV E velocity: 103.00 cm/s MV A velocity: 117.00 cm/s  SHUNTS MV E/A ratio:  0.88         Systemic VTI:  0.19 m                             Systemic Diam: 2.40 cm Maude Emmer MD Electronically signed by Maude Emmer MD Signature Date/Time: 03/17/2024/8:58:09 AM    Final    CT ABDOMEN PELVIS WO CONTRAST Result Date: 03/17/2024 EXAM: CT ABDOMEN AND PELVIS WITHOUT CONTRAST 03/17/2024 02:15:11 AM TECHNIQUE: CT of the abdomen and pelvis was performed without the administration of intravenous contrast. Multiplanar reformatted images are provided for review. Automated exposure control, iterative reconstruction, and/or weight-based adjustment of the mA/kV was utilized to reduce the radiation dose to as low as reasonably achievable. COMPARISON: 02/16/2024 CLINICAL HISTORY: Abdominal pain, acute, nonlocalized. FINDINGS: LIMITATIONS/ARTIFACTS: Motion degraded images. LOWER CHEST: Linear scarring/atelectasis at the lung bases. LIVER: The liver is unremarkable. GALLBLADDER AND BILE DUCTS: Status post cholecystectomy. No biliary ductal dilatation. SPLEEN: No acute abnormality. PANCREAS: No acute abnormality. ADRENAL GLANDS: No acute abnormality. KIDNEYS, URETERS AND BLADDER: No stones in the kidneys or ureters. No hydronephrosis. No perinephric or periureteral stranding. Urinary bladder is unremarkable. GI AND BOWEL: Stomach demonstrates no acute abnormality. There is no bowel obstruction. PERITONEUM AND RETROPERITONEUM: No ascites. No free air. VASCULATURE: Aorta is normal in caliber. Mild atherosclerotic calcifications of the abdominal  aorta. LYMPH NODES: No lymphadenopathy. REPRODUCTIVE ORGANS: Mildly heterogeneous uterus, reflecting uterine fibroids. 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 BONES AND SOFT TISSUES: Mild degenerative changes of the lower thoracic and lower lumbar spine. Procedural changes in the left anterior upper thigh (image 493). Body wall edema. No acute osseous abnormality. No focal soft tissue abnormality. IMPRESSION: 1. No acute findings in the abdomen or pelvis. Electronically signed by: Pinkie Pebbles MD MD 03/17/2024 02:25 AM EST RP Workstation: [id number]   DG Chest Portable 1 View Result Date: 03/16/2024 EXAM: 1 VIEW XRAY OF THE CHEST 03/16/2024 11:04:00 PM COMPARISON: 03/16/2024 CLINICAL HISTORY: central line placement FINDINGS: LINES, TUBES AND DEVICES: Right chest wall dialysis catheter in place with tip overlying right atrium. LUNGS AND PLEURA: Low lung volumes. No focal pulmonary opacity. No pleural effusion. No pneumothorax. HEART AND MEDIASTINUM: No acute abnormality of the cardiac and mediastinal silhouettes. BONES AND SOFT TISSUES: Degenerative changes of bilateral shoulders. No acute osseous abnormality. IMPRESSION: 1. Right chest wall dialysis catheter with tip in the right atrium. Electronically signed by: Pinkie Pebbles MD MD 03/16/2024 11:08 PM EST RP Workstation: [id number]   DG Chest Portable 1 View Result Date: 03/16/2024 CLINICAL DATA:  Syncopal episodes. EXAM: PORTABLE CHEST 1 VIEW COMPARISON:  February 17, 2024 FINDINGS: There is stable right-sided venous catheter positioning. The left-sided venous catheter seen on the prior study has been removed. The heart size and mediastinal contours are within normal limits. Low lung volumes are noted. Mild atelectatic changes are seen within the bilateral lung bases. No pleural effusion or pneumothorax is identified. The visualized skeletal structures are unremarkable.  IMPRESSION: Low lung volumes with mild bibasilar atelectasis. Electronically Signed   By: Suzen Dials M.D.   On: 03/16/2024 18:19       Electronically signed by:   Annalee Orem, MD Infectious Disease Physician [facility] for Infectious Disease Pager: [phone]  "

## 2024-04-04 NOTE — TOC Progression Note (Signed)
 Transition of Care [facility]) - Progression Note    Patient Details  Name: Deborah [name] MRN: [id number] Date of Birth: May 20, 1965  Transition of [facility]) CM/SW Contact  Inocente GORMAN Kindle, LCSW Phone Number: 04/04/2024, 8:59 AM  Clinical Narrative:    Patient transferred to 5W. Continuing to follow and monitor for bed at [facility]    Expected Discharge Plan: Long Term Nursing Home Barriers to Discharge: Continued Medical Work up               Expected Discharge Plan and Services       Living arrangements for the past 2 months: Skilled Nursing Facility                                       Social Drivers of Health (SDOH) Interventions SDOH Screenings   Food Insecurity: No Food Insecurity (03/18/2024)  Housing: Low Risk (03/18/2024)  Transportation Needs: No Transportation Needs (03/18/2024)  Utilities: Not At Risk (03/18/2024)  Recent Concern: Utilities - At Risk (02/18/2024)  Depression (PHQ2-9): Medium Risk (03/16/2023)  Financial Resource Strain: Low Risk (03/23/2021)   Received [facility])  Social Connections: Low Risk (03/23/2021)   Received from [facility])  Stress: Low Risk (03/23/2021)   Received from [facility])  Tobacco Use: Low Risk (04/01/2024)    Readmission Risk Interventions    03/28/2024    3:24 PM 02/20/2024   11:05 AM 03/07/2023    4:19 PM  Readmission Risk Prevention Plan  Transportation Screening Complete Complete Complete  Medication Review (RN Care Manager) Complete Complete Complete  PCP or Specialist appointment within 3-5 days of discharge Complete Complete Complete  HRI or Home Care Consult Complete Complete Complete  SW Recovery Care/Counseling Consult Complete Complete Complete  Palliative Care Screening Complete Not Applicable Complete  Skilled Nursing Facility Complete Complete Not Applicable

## 2024-04-04 NOTE — Progress Notes (Signed)
 Occupational Therapy Discharge Patient Details Name: Deborah [name] MRN: [id number] DOB: [birth year] Today's Date: 04/04/2024 Time: 9149-9085 OT Time Calculation (min): 24 min  Patient discharged from OT services secondary to patient has made no progress toward goals in a reasonable time frame.  Please see latest therapy progress note for current level of functioning and progress toward goals.    Progress and discharge plan discussed with patient and/or caregiver: Patient unable to participate in discharge planning and no caregivers available  GO   Maurilio CROME, OTR/L.  [facility] Acute Rehabilitation  Office: [phone]   Maurilio PARAS Alvin Rubano 04/04/2024, 11:23 AM

## 2024-04-04 NOTE — Progress Notes (Signed)
 Physical Therapy Treatment and DISCHARGE Patient Details Name: Deborah [name] MRN: [id number] DOB: [birth year] Today's Date: 04/04/2024   History of Present Illness Pt is a 59 y/o female presenting from SNF with syncope. Admited for shock and sepsis. Required CRRT 1/10-1/13.  /p Excisional debridement of L thigh 1/25.  PMH: R BKA, paroxysmal atrial fibrillation, diabetes mellitus type 2, chronic pain, hyperlipidemia, ESRD on HD    PT Comments  Pt continues to demonstrate little active movement to contribute towards bed mobility and transfers. Pt reports improved pain this AM but continues to rate 10/10 when presented with NPRS. Attempted to perfom supine to sit EOB to assist pt with eating breakfast in upright position. Pt totalAx2 for bed mobility and demonstrates significant posterior lean EOB with L hip sliding forward. Pt returned to bed using pads to sling and pivot for safety. Pt able to mobilize B LE with increased time to assist with repositioning in bed but is totalAx2 to boost.  Pt not participating in exercises discussed last session. Pt not appropriate to continue with skilled PT at this time due to limited tolerance, participation, and level of assist required at baseline. Re-consult if mobility needs change.    If plan is discharge home, recommend the following: Two people to help with walking and/or transfers;Two people to help with bathing/dressing/bathroom;Assistance with cooking/housework;Assistance with feeding;Direct supervision/assist for medications management;Direct supervision/assist for financial management;Help with stairs or ramp for entrance;Assist for transportation;Supervision due to cognitive status   Can travel by private vehicle     No (Level of assist, cognition, pain)  Equipment Recommendations  None recommended by PT (Pt has appropriate equipment at facility)    Recommendations for Other Services       Precautions / Restrictions Precautions Precautions:  Fall Recall of Precautions/Restrictions: Impaired Precaution/Restrictions Comments: Multiple open wounds Restrictions Weight Bearing Restrictions Per Provider Order: Yes RLE Weight Bearing Per Provider Order: Non weight bearing     Mobility  Bed Mobility Overal bed mobility: Needs Assistance Bed Mobility: Supine to Sit, Sit to Supine     Supine to sit: Total assist, +2 for physical assistance, +2 for safety/equipment Sit to supine: Total assist, +2 for physical assistance, +2 for safety/equipment   General bed mobility comments: Pt agreeable to attempt sitting EOB to eat breakfast with PT/OT support. Pt with significant posterior lean EOB causing L hip to begin to slide off EOB. Pads used to hammock and pivot pt back into bed for safety. Pt total assist x2 to reposition in bed.    Transfers                        Ambulation/Gait                   Stairs             Wheelchair Mobility     Tilt Bed    Modified Rankin (Stroke Patients Only)       Balance Overall balance assessment: Needs assistance Sitting-balance support: No upper extremity supported Sitting balance-Leahy Scale: Zero Sitting balance - Comments: Pt with significant posterior lean when attempting to sit EOB. Unable to reposition or use UE to assist with balance. Unable to safely maintain sitting balance this session. Postural control: Posterior lean                                  Communication Communication Communication:  Impaired Factors Affecting Communication: Reduced clarity of speech  Cognition Arousal: Alert Behavior During Therapy: WFL for tasks assessed/performed, Flat affect   PT - Cognitive impairments: Awareness, Attention, Problem solving, Safety/Judgement, Initiation, Sequencing                         Following commands: Impaired Following commands impaired: Follows one step commands with increased time, Follows one step commands  inconsistently    Cueing Cueing Techniques: Verbal cues, Gestural cues, Visual cues, Tactile cues  Exercises General Exercises - Lower Extremity Ankle Circles/Pumps: AROM, Left, 10 reps (Minimal ROM)    General Comments General comments (skin integrity, edema, etc.): VSS throughout. Open wound along medial thigh. Multliple wounds B LE.      Pertinent Vitals/Pain Pain Assessment Pain Assessment: 0-10 Pain Score: 10-Worst pain ever (Pt initially reports pain is improved and tolerable, then provides 10/10 rating) Pain Location: B LE Pain Descriptors / Indicators: Aching, Sore Pain Intervention(s): Limited activity within patient's tolerance, Monitored during session, Repositioned, Patient requesting pain meds-RN notified    Home Living                          Prior Function            PT Goals (current goals can now be found in the care plan section) Acute Rehab PT Goals Patient Stated Goal: Decrease pain PT Goal Formulation: With patient Time For Goal Achievement: 04/05/24 Potential to Achieve Goals: Good Progress towards PT goals: Progressing toward goals    Frequency     (Discharge)      PT Plan      Co-evaluation PT/OT/SLP Co-Evaluation/Treatment: Yes Reason for Co-Treatment: Complexity of the patient's impairments (multi-system involvement);Necessary to address cognition/behavior during functional activity PT goals addressed during session: Strengthening/ROM;Mobility/safety with mobility OT goals addressed during session: Strengthening/ROM;ADL's and self-care      AM-PAC PT 6 Clicks Mobility   Outcome Measure  Help needed turning from your back to your side while in a flat bed without using bedrails?: Total Help needed moving from lying on your back to sitting on the side of a flat bed without using bedrails?: Total Help needed moving to and from a bed to a chair (including a wheelchair)?: Total Help needed standing up from a chair using your  arms (e.g., wheelchair or bedside chair)?: Total Help needed to walk in hospital room?: Total Help needed climbing 3-5 steps with a railing? : Total 6 Click Score: 6    End of Session   Activity Tolerance: Patient limited by pain;Patient limited by fatigue Patient left: in bed;with call bell/phone within reach;with bed alarm set Nurse Communication: Mobility status;Need for lift equipment;Patient requests pain meds PT Visit Diagnosis: Unsteadiness on feet (R26.81);Muscle weakness (generalized) (M62.81);Other abnormalities of gait and mobility (R26.89);Pain Pain - Right/Left:  (Bilateral) Pain - part of body: Leg     Time: 9146-9085 PT Time Calculation (min) (ACUTE ONLY): 21 min  Charges:    $Therapeutic Activity: 8-22 mins PT General Charges $$ ACUTE PT VISIT: 1 Visit                     [name] Morel, PT, DPT  Acute Rehabilitation Services         Office: [phone]      [name] 04/04/2024, 11:17 AM

## 2024-04-04 NOTE — Plan of Care (Signed)
  Problem: Education: Goal: Knowledge of General Education information will improve Description: Including pain rating scale, medication(s)/side effects and non-pharmacologic comfort measures Outcome: Progressing   Problem: Health Behavior/Discharge Planning: Goal: Ability to manage health-related needs will improve Outcome: Progressing   Problem: Clinical Measurements: Goal: Ability to maintain clinical measurements within normal limits will improve Outcome: Progressing Goal: Will remain free from infection Outcome: Progressing   Problem: Activity: Goal: Risk for activity intolerance will decrease Outcome: Progressing   Problem: Nutrition: Goal: Adequate nutrition will be maintained Outcome: Progressing   Problem: Elimination: Goal: Will not experience complications related to bowel motility Outcome: Progressing   Problem: Pain Managment: Goal: General experience of comfort will improve and/or be controlled Outcome: Progressing

## 2024-04-04 NOTE — Progress Notes (Signed)
 Occupational Therapy Treatment and Discharge Patient Details Name: Deborah [name] MRN: [id number] DOB: March 08, 1966 Today's Date: 04/04/2024   History of present illness Pt is a 59 y/o female presenting from SNF with syncope. Admited for shock and sepsis. Required CRRT 1/10-1/13.  /p Excisional debridement of L thigh 1/25.  PMH: R BKA, paroxysmal atrial fibrillation, diabetes mellitus type 2, chronic pain, hyperlipidemia, ESRD on HD   OT comments  Pt seen this date in conjunction with PT this date to assess Pt progress towards functional goals. Pt with minimal active participation in tasks this session, requiring total A for functional transfers and ADL task engagement. Pt with decreased ability to effectively follow commands for safe therapy engagement. Pt remains at baseline level requiring assist for ADLs at bed level. At this time, Pt is not progressing towards goals within reasonable amount of time. OT to sign-off. Please re-consult if needs arise.       If plan is discharge home, recommend the following:  Two people to help with walking and/or transfers;Two people to help with bathing/dressing/bathroom;Assist for transportation   Equipment Recommendations  None recommended by OT    Recommendations for Other Services      Precautions / Restrictions Precautions Precautions: Fall Recall of Precautions/Restrictions: Impaired Precaution/Restrictions Comments: Multiple open wounds Restrictions Weight Bearing Restrictions Per Provider Order: Yes RLE Weight Bearing Per Provider Order: Non weight bearing Other Position/Activity Restrictions: hx of R BKA       Mobility Bed Mobility Overal bed mobility: Needs Assistance Bed Mobility: Supine to Sit, Sit to Supine     Supine to sit: Total assist, +2 for physical assistance, +2 for safety/equipment Sit to supine: Total assist, +2 for physical assistance, +2 for safety/equipment   General bed mobility comments: Pt agreeable to  attempt sitting EOB to eat breakfast with PT/OT support. Pt with significant posterior lean EOB causing L hip to begin to slide off EOB. Pads used to hammock and pivot pt back into bed for safety. Pt total assist x2 to reposition in bed. Pt with increased anxiety related to sitting EOB, unable to actively participate in mobility attempt.    Transfers                   General transfer comment: would need maximove     Balance Overall balance assessment: Needs assistance Sitting-balance support: No upper extremity supported Sitting balance-Leahy Scale: Zero Sitting balance - Comments: Pt with significant posterior lean when attempting to sit EOB. Unable to reposition or use UE to assist with balance. Unable to safely maintain sitting balance this session. Postural control: Posterior lean                                 ADL either performed or assessed with clinical judgement   ADL Overall ADL's : Needs assistance/impaired Eating/Feeding: Set up;Bed level                   Lower Body Dressing: Total assistance;Bed level                      Extremity/Trunk Assessment Upper Extremity Assessment Upper Extremity Assessment: RUE deficits/detail;LUE deficits/detail RUE Deficits / Details: impaired shoulder flexion to < 25*, increased swelling noted. Pt requesting HHA with R hand to assist with movement, unable to functionally use RUE during mobility RUE Coordination: decreased fine motor;decreased gross motor LUE Deficits / Details: impaired shoulder flexion  to < 25* LUE Coordination: decreased fine motor;decreased gross Systems Analyst Communication Communication: Impaired Factors Affecting Communication: Reduced clarity of speech   Cognition Arousal: Alert Behavior During Therapy: Anxious Cognition: Cognition impaired     Awareness: Intellectual awareness intact, Online awareness  impaired     Executive functioning impairment (select all impairments): Initiation, Organization, Sequencing, Reasoning, Problem solving OT - Cognition Comments: Pt with decreased insight into medical complexities.                 Following commands: Impaired Following commands impaired: Follows one step commands with increased time, Follows one step commands inconsistently      Cueing   Cueing Techniques: Verbal cues, Gestural cues, Visual cues, Tactile cues  Exercises      Shoulder Instructions       General Comments VSS throughout. Open wound along medial thigh. Multliple wounds B LE.    Pertinent Vitals/ Pain       Pain Assessment Pain Assessment: 0-10 Pain Score: 10-Worst pain ever Pain Location: B LE Pain Descriptors / Indicators: Aching, Sore Pain Intervention(s): Limited activity within patient's tolerance, Monitored during session, Repositioned, Patient requesting pain meds-RN notified  Home Living                                          Prior Functioning/Environment              Frequency  Other (comment) (d/c services)        Progress Toward Goals  OT Goals(current goals can now be found in the care plan section)  Progress towards OT goals: Not progressing toward goals - comment (Not demonstrating progress, OT signing off)  Acute Rehab OT Goals Patient Stated Goal: none stated this date OT Goal Formulation: With patient Time For Goal Achievement: 04/05/24 Potential to Achieve Goals: Poor ADL Goals Pt Will Perform Grooming: with set-up;sitting;bed level Pt/caregiver will Perform Home Exercise Program: Increased ROM;Increased strength;Both right and left upper extremity;With Supervision;With written HEP provided Additional ADL Goal #1: Pt to demo ability to roll with Min A during ADLs Additional ADL Goal #2: Pt will be able to sit EOB with no more than min A to then work on grooming activities  Plan       Co-evaluation    PT/OT/SLP Co-Evaluation/Treatment: Yes Reason for Co-Treatment: Complexity of the patient's impairments (multi-system involvement);Necessary to address cognition/behavior during functional activity PT goals addressed during session: Strengthening/ROM;Mobility/safety with mobility OT goals addressed during session: Strengthening/ROM;ADL's and self-care      AM-PAC OT 6 Clicks Daily Activity     Outcome Measure   Help from another person eating meals?: A Little Help from another person taking care of personal grooming?: A Lot Help from another person toileting, which includes using toliet, bedpan, or urinal?: Total Help from another person bathing (including washing, rinsing, drying)?: Total Help from another person to put on and taking off regular upper body clothing?: A Lot Help from another person to put on and taking off regular lower body clothing?: Total 6 Click Score: 10    End of Session    OT Visit Diagnosis: Muscle weakness (generalized) (M62.81)   Activity Tolerance Other (comment) (Limited by decreased engagement in mobility, unsafe to maintain sitting  position)   Patient Left in bed;with call bell/phone within reach   Nurse Communication          Time: [id number] OT Time Calculation (min): 24 min  Charges: OT General Charges $OT Visit: 1 Visit OT Treatments $Therapeutic Activity: 8-22 mins  Maurilio CROME, OTR/L.  [facility] Acute Rehabilitation  Office: [phone]   Maurilio PARAS Ayven Pheasant 04/04/2024, 11:18 AM

## 2024-04-04 NOTE — Progress Notes (Signed)
 Patient refused dressing over his left thigh and not to cover her wound at her back. Educated on the need of wound care; acknowledged but would not participate as of the moment.

## 2024-04-04 NOTE — Progress Notes (Signed)
 " [facility] Progress Note   Subjective:   Seen in room, appears tired but awakens to voice and oriented to person, place, time. Denies SOB, CP, dizziness, nausea, leg pain. Finally has a little bit of an appetite   Objective Vitals:   04/03/24 1800 04/03/24 2232 04/04/24 0000 04/04/24 0800  BP: (!) 104/35 (!) 98/58 101/61 (!) 102/47  Pulse: 88 88 83   Resp: 20 20 18    Temp: 98.4 F (36.9 C)  98.9 F (37.2 C) 98.2 F (36.8 C)  TempSrc:   Oral Oral  SpO2: 100% 100% 100%   Weight:      Height:       Physical Exam General: chronically ill appearing female in NAD Heart: RRR, no murmur Lungs: CTA bilaterally, respirations unlabored Abdomen: Soft, non-distended, +BS Extremities: 1-2+ edema bilateral lower extremities tender, rt LE amputation Dialysis Access: LIJ temp (1/27), lt BCF pulsatile and augments but very floppy skin  Additional Objective Labs: Basic Metabolic Panel: Recent Labs  Lab 03/30/24 0846 04/01/24 1401 04/01/24 1902 04/02/24 0548 04/03/24 1200  NA 140   < > 140 139 139  K 3.5   < > 3.7 3.8 3.1*  CL 106  --  102 100 101  CO2 23  --  24 23 23   GLUCOSE 85  --  117* 99 145*  BUN 25*  --  23* 26* 32*  CREATININE 4.31*  --  4.09* 4.19* 5.04*  CALCIUM  8.0*  --  7.9* 8.1* 8.2*  PHOS 4.3  --  3.8 4.3  --    < > = values in this interval not displayed.   Liver Function Tests: Recent Labs  Lab 03/29/24 1230 03/30/24 0846 04/01/24 1902  AST  --   --  14*  ALT  --   --  6  ALKPHOS  --   --  61  BILITOT  --   --  0.9  PROT  --   --  4.9*  ALBUMIN  1.9* 2.6* 3.4*   Recent Labs  Lab 04/01/24 1902  LIPASE <10*  AMYLASE 20*   CBC: Recent Labs  Lab 03/30/24 0846 03/30/24 1454 04/01/24 1401 04/01/24 1902 04/02/24 0548 04/03/24 1200  WBC 15.1* 16.1*  --  20.7* 19.0* 14.3*  NEUTROABS 11.9*  --   --  19.4*  --   --   HGB 6.7* 9.7*   < > 8.9* 8.7* 8.6*  HCT 20.4* 28.3*   < > 27.0* 24.9* 25.3*  MCV 98.6 92.5  --  96.1 92.6 94.8  PLT  337 324  --  259 251 237   < > = values in this interval not displayed.   Blood Culture    Component Value Date/Time   SDES BLOOD RIGHT ANTECUBITAL 03/16/2024 1949   SPECREQUEST  03/16/2024 1949    BOTTLES DRAWN AEROBIC AND ANAEROBIC Blood Culture results may not be optimal due to an inadequate volume of blood received in culture bottles   CULT (A) 03/16/2024 1949    KLEBSIELLA PNEUMONIAE Confirmed Extended Spectrum Beta-Lactamase Producer (ESBL).  In bloodstream infections from ESBL organisms, carbapenems are preferred over piperacillin /tazobactam. They are shown to have a lower risk of mortality.    REPTSTATUS 03/19/2024 FINAL 03/16/2024 1949    Cardiac Enzymes: No results for input(s): CKTOTAL, CKMB, CKMBINDEX, TROPONINI in the last 168 hours. CBG: Recent Labs  Lab 04/02/24 2152 04/03/24 0749 04/03/24 1119 04/03/24 2343 04/04/24 0814  GLUCAP 142* 103* 102* 147* 76   Iron  Studies: No  results for input(s): IRON , TIBC, TRANSFERRIN, FERRITIN in the last 72 hours. @lablastinr3 @ Studies/Results: IR NON-TUNNELED CENTRAL VENOUS CATH [facility] W IMG Result Date: 04/03/2024 INDICATION: End-stage renal disease. Tunneled dialysis catheter was removed on 03/30/2022 due to bacteremia. Patient needs new temporary dialysis access. EXAM: FLUOROSCOPIC AND ULTRASOUND GUIDED PLACEMENT OF A NON-TUNNELED DIALYSIS CATHETER Physician: Juliene SAUNDERS. Philip, MD MEDICATIONS: 1% lidocaine  for local anesthetic ANESTHESIA/SEDATION: None FLUOROSCOPY TIME:  Radiation Exposure Index (as provided by the fluoroscopic device): 7 mGy Kerma COMPLICATIONS: None immediate. PROCEDURE: Informed consent was obtained for catheter placement. The patient was placed supine on the interventional table. Ultrasound confirmed a patent left internal jugular vein. Ultrasound images were obtained for documentation. The left neck was prepped and draped in a sterile fashion. Maximal barrier sterile technique was utilized including  caps, mask, sterile gowns, sterile gloves, sterile drape, hand hygiene and skin antiseptic. The right neck was anesthetized with 1% lidocaine . A small incision was made with #11 blade scalpel. A 21 gauge needle directed into the left internal jugular vein with ultrasound guidance. A micropuncture dilator set was placed. J wire was placed and a Kumpe catheter was advanced to the IVC. J wire was exchanged for a superstiff Amplatz wire. A 20 cm Mahurkar catheter was selected. The catheter was advanced over a wire and positioned at the superior cavoatrial junction. Fluoroscopic images were obtained for documentation. Both dialysis lumens were found to aspirate and flush well. The proper amount of heparin  was flushed in both lumens. The central venous lumen was flushed with normal saline. Catheter was sutured to skin. FINDINGS: Catheter tip at the superior cavoatrial junction. IMPRESSION: Successful placement of a left jugular non-tunneled dialysis catheter using ultrasound and fluoroscopic guidance. Electronically Signed   By: Juliene Philip M.D.   On: 04/03/2024 10:09   CT TIBIA FIBULA LEFT WO CONTRAST Result Date: 04/03/2024 EXAM: CT Left Lower Extremity, Without IV Contrast 04/02/2024 03:48:57 PM TECHNIQUE: Axial images were acquired through the left lower extremity without IV contrast. Reformatted images were reviewed. Automated exposure control, iterative reconstruction, and/or weight based adjustment of the mA/kV was utilized to reduce the radiation dose to as low as reasonably achievable. COMPARISON: None available. CLINICAL HISTORY: Left calf edema, infection of left thigh debrided 01/25. FINDINGS: BONES AND JOINTS: Severe osteoarthritis of the left knee. There is at least a small left knee effusion. Expected small free osteochondral fragments posteriorly in the knee joint and then a small Baker cyst. Substantial osteoarthritis at the tibiotalar articulation with loss of articular space and spurring. Degenerative  spurring along the Chopart joint and in the midfoot. Confluent subcutaneous edema superficial to the posteromedial margin of the calcaneus, there is some underlying spurring in this vicinity but no definite bony destructive findings to indicate osteomyelitis. No bony destructive findings characteristic of osteomyelitis. No acute fracture or focal osseous lesion. No dislocation. SOFT TISSUES: Atheromatous vascular calcifications. Posterior to the distal tibia for example on image 37 series 9, possibly from wound or ulceration. Subcutaneous edema in the calf becomes circumferential approaching the ankle what is primarily posterior and anteromedial elsewhere in the calf. Cellulitis is not excluded. Moderate regional muscular atrophy. No drainable abscess identified. Thickened medial band of the plantar fascia, cannot exclude plantar fasciitis. No findings characteristic for abscess identified. IMPRESSION: 1. Subcutaneous edema in the calf, primarily posterior and anteromedial, becoming circumferential near the ankle, with possible cellulitis; no drainable abscess and no CT findings of osteomyelitis. 2. Soft tissue abnormality posterior to the distal tibia, consistent with wound or  ulceration. 3. Severe osteoarthritis of the left knee with a small effusion, small Baker cyst, and small posterior intra-articular osteochondral fragments. 4. Substantial osteoarthritis at the tibiotalar articulation with joint space loss and spurring. 5. Degenerative spurring along the Chopart joint and in the midfoot, with calcaneal spurring along the posteromedial margin. 6. Thickened medial band of the plantar fascia, possible plantar fasciitis. 7. Moderate regional muscular atrophy. 8. Atheromatous vascular calcifications. 9. No acute osseous abnormality. Electronically signed by: Ryan Salvage MD 04/03/2024 08:09 AM EST RP Workstation: [id number]   Medications:  calcium  gluconate     ertapenem  500 mg (04/03/24 2329)   linezolid   (ZYVOX ) IV 600 mg (04/03/24 2252)    amiodarone   200 mg Oral Daily   ascorbic acid   500 mg Oral BID   atorvastatin   10 mg Oral Daily   busPIRone   5 mg Oral TID   collagenase    Topical Daily   darbepoetin (ARANESP ) injection - DIALYSIS  150 mcg Subcutaneous Q Tue-1800   famotidine   20 mg Oral QHS   feeding supplement  1 Container Oral Q24H   fluconazole   200 mg Oral Weekly   heparin  injection (subcutaneous)  5,000 Units Subcutaneous Q8H   midodrine   15 mg Oral Q8H   multivitamin  1 tablet Oral QHS   mouth rinse  15 mL Mouth Rinse 4 times per day   potassium chloride   40 mEq Oral Once   risperiDONE   1 mg Oral QHS   sodium chloride  flush  10-40 mL Intracatheter Q12H   vancomycin   125 mg Oral Daily    Assessment/Plan: ESRD - Usual TTS schedule - s/p CRRT 1/10-1/13 given significant shock - Dialyzed Friday 1/23 prior to [facility] removal. No acute indications for HD at this time. Appreciate VIR placing LIJ temp cath on 1/27 and tolerated HD Tues with 2L net UF. Plan next HD for Thur. Eventually will need TC (WBC 19k now)  Lt BCF is not usable at this time (very floppy skin and will only lead to infiltration)   PAfib - cardiology following, back on PO amiodarone    Hypotension/volume - Septic on admit,  S/p pressors - Now on high dose midodrine  20mg  TID + extra prn with HD. - Still up volume wise, UF as tolerated with HD   Sepsis/ESBL Klebsiella bacteremia - On ertapenem , has been ordered for outpatient (clinic will notify hospital when it arrives) - Blood Cx 03/16/24 positive - issues getting repeat cultures as she has horrible peripheral veins - TDC is out   Progressive dusky rash to pelvic/groin area, lower abd pannus and B legs  - seen by general surgery for worsening fever, hypotension -s/p debridement on 04/01/24 -on  antibiotics per priamry team   Anemia of ESRD - Hgb 8.7 - S/p 2U PRBCs on 1/23 - Continue weekly Aranesp    Diarrhea - On empiric PO Vanc given Hx C.diff -  Toxin negative on 1/10   T2DM - Insulin  per primary   Dispo/GOC - Palliative following.  - Plan is SNF      "

## 2024-04-04 NOTE — Progress Notes (Signed)
 " PROGRESS NOTE  Deborah [name]  FMW:[id number] DOB: [birth year] DOA: 03/16/2024 PCP: Pcp, No   Brief Narrative: Patient is a 59 year old female with past medical history of paroxysmal A-fib, hyperlipidemia, right BKA, ESRD on dialysis on TTS schedule who presented from skilled nursing facility for further evaluation of syncopal episode with loss of consciousness.  On presentation, she was hypotensive.  Lab work showed creatinine of 7, troponin of 105, lactic acid of 9.  Patient also reported diffuse abdominal pain.  Patient was admitted and was started on broad-spectrum antibiotics.  She was recently hospitalized on last December for sepsis secondary to left thigh abscess and underwent I&D with general surgery on 02/16/2024.  On this admission, she was found to have sepsis secondary to Klebsiella bacteremia and was on meropenem , she underwent multiple sessions of CRRT .  She was transferred to [facility] service on 03/22/2024 but remained hypotensive, patient was also refusing care.  Found to have new progressive bilateral thigh erythema and swelling, concern for necrotizing fasciitis.  General surgery consulted.  Underwent excisional debridement of the left thigh skin, subcutaneous tissue.  Was briefly transferred to ICU for that on 1/25.  Patient transferred back to [facility] service on 1/26. Underwent placement of left internal jugular temporary dialysis catheter by IR today  Major Hospital events:  1/9 ED s/p witnessed syncopal event w/ +LOC in shock, started on Levo/Vaso; Aline/CVC placed 1/10.  CT abdomen pelvis in the ER.  Nonacute.   1/10 CRRT, still on vaso, NE. Esclated to meropenem  for ESBL Klebsiella 1/11 echocardiogram EF 60%.  No acute changes 1/12: off levophed , on vasopressin  1/13: off vasopressin , steroids, and CRRT 1/14: transfer out of ICU, TRH to pick up 1/15 1/23: Transfused units PRBC for hemoglobin of 6.7 1/23: TDC catheter continued by IR 1/25: Remains febrile, with rapidly  progressive bilateral groin/thigh/lower abdomen area erythema swelling concerning for necrotizing fasciitis 1/27: Underwent placement of left internal jugular temporary dialysis catheter by IR        Assessment & Plan:  Suspected necrotizing fasciitis/cellulitis of left lower extremity, history of abscess in the left thigh before:   Developed rapidly progressive lower extremity edema.  There was concern for necrotizing fasciitis.  General surgery consulted.  Underwent excisional debridement of the left thigh on 04/02/24.  She also had a history of left groin abscess on last admission and underwent I&D by Dr. Vernetta on 02/16/2024.  At that time CT abdomen/pelvis did not show any acute findings.  She was also seen by orthopedics, Dr. Harden on 1/15.  ID and CCS following currently on combination of linezolid , ertapenem , fluconazole    Septic shock secondary to Klebsiella pneumonia bacteremia: Source of infection was unclear but now appears to be left thigh neck Fash, she also had an indwelling HD catheter which was removed and a new catheter placed this admission, ID following kindly see above.  ESRD on dialysis: Nephrology following.  Dialyzed on TTS schedule.  IR placed new temporary dialysis catheter on 1/27  Chronic hypotension: Required vasopressor therapy, currently on midodrine .  Currently off vasopressors.  Blood pressure stable   Opioid use disorder/substance abuse/history of fibromyalgia: Follows outpatient with [facility] pain clinic.  Minimize narcotics, history of narcotic seeking behavior, use narcotics with caution as blood pressure tends to fall with high doses.  Patient has been counseled multiple times.  History of C. difficile: C. difficile negative.  On prophylactic vancomycin  because she is on antibiotics  Anemia of chronic disease: Likely secondary to ESRD.  Given blood  transfusion here.  Currently hemoglobin is stable Syncope: Echo showed EF of 60 to 65%, mild LVH, a  small pericardial effusion posteriorly.  Troponin elevation/paroxysmal A-fib: Not on anticoagulant due to history of GI bleed.  Seen by cardiology for RVR placed on amiodarone  this admission, currently in sinus, echo done which showed preserved EF of 60% and no wall motion abnormality.  Stable from the standpoint.   Hyperlipidemia: On statin  History of duodenal carcinoid tumor s/p resection and lung monitor/gastric ulcer/gastritis/chronic gastroparesis: On H2 blocker,  History of peripheral artery disease: Status post right BKA.  Stump is well-healed  Possible vaginal yeast infection: On weekly Diflucan   Anxiety/depression: On BuSpar , risperidone , trazodone  at home  Diabetes type 2: On sliding scale now.  A1c 4.3 as per 02/15/2024.  Will DC insulin   Debility/deconditioning: Patient is from SNF.  Plan for discharge to SNF when appropriate.  Long length of stay    Pressure ulcers:  Had wound on the left thigh, right hip, lower abdomen, buttocks, left heel.  Wound care nurse following .Dr. Harden was consulted to evaluate left thigh postop site.   Nutrition Problem: Moderate Malnutrition Etiology: acute illness Wound 02/19/24 0200 Pressure Injury Foot Left;Lateral Unstageable - Full thickness tissue loss in which the base of the injury is covered by slough (yellow, tan, gray, green or brown) and/or eschar (tan, brown or black) in the wound bed. (Active)     Wound 03/17/24 0045 Pressure Injury Thigh Left;Posterior Stage 2 -  Partial thickness loss of dermis presenting as a shallow open injury with a red, pink wound bed without slough. (Active)     Wound 03/17/24 0045 Pressure Injury Hip Lateral;Right Stage 2 -  Partial thickness loss of dermis presenting as a shallow open injury with a red, pink wound bed without slough. (Active)     Wound 03/17/24 0045 Pressure Injury Abdomen Lower;Left Stage 2 -  Partial thickness loss of dermis presenting as a shallow open injury with a red, pink wound  bed without slough. (Active)     Wound 03/17/24 1119 Pressure Injury Buttocks Right;Left;Medial Stage 3 -  Full thickness tissue loss. Subcutaneous fat may be visible but bone, tendon or muscle are NOT exposed. (Active)    DVT prophylaxis:heparin  injection 5,000 Units Start: 04/02/24 1400 Place and maintain sequential compression device Start: 03/20/24 1800     Code Status: Full Code  Family Communication: None at the bedside  Patient status: Inpatient inpatient  Patient is from : SNF  Anticipated discharge to: SNF  Estimated DC date: Not sure.  Needs clearance from ID,nephrology   Consultants: Nephrology, critical care, general surgery, cardiology, palliative care  Procedures: Dialysis, I&D  Antimicrobials:  Anti-infectives (From admission, onward)    Start     Dose/Rate Route Frequency Ordered Stop   04/22/24 1000  vancomycin  (VANCOCIN ) 50 mg/mL oral solution SOLN 125 mg  Status:  Discontinued       Placed in Followed by Linked Group   125 mg Per Tube Every 3 DAYS 03/17/24 1020 03/17/24 1332   04/14/24 1000  vancomycin  (VANCOCIN ) 50 mg/mL oral solution SOLN 125 mg  Status:  Discontinued       Placed in Followed by Linked Group   125 mg Per Tube Every other day 03/17/24 1020 03/17/24 1332   04/07/24 1000  vancomycin  (VANCOCIN ) 50 mg/mL oral solution SOLN 125 mg  Status:  Discontinued       Placed in Followed by Linked Group   125 mg Per Tube Daily 03/17/24  1020 03/17/24 1332   04/02/24 1300  vancomycin  (VANCOCIN ) capsule 125 mg        125 mg Oral Daily 04/02/24 1157 04/22/24 2359   04/01/24 1130  linezolid  (ZYVOX ) IVPB 600 mg        600 mg 300 mL/hr over 60 Minutes Intravenous Every 12 hours 04/01/24 1055 04/15/24 2359   04/01/24 1030  vancomycin  (VANCOREADY) IVPB 1500 mg/300 mL  Status:  Discontinued        1,500 mg 150 mL/hr over 120 Minutes Intravenous  Once 04/01/24 0953 04/01/24 2252   03/31/24 1000  vancomycin  (VANCOCIN ) 50 mg/mL oral solution SOLN 125 mg   Status:  Discontinued       Placed in Followed by Linked Group   125 mg Per Tube 2 times daily 03/17/24 1020 03/17/24 1332   03/30/24 2200  ertapenem  (INVANZ ) 500 mg in sodium chloride  0.9 % 50 mL IVPB        500 mg 100 mL/hr over 30 Minutes Intravenous Every 24 hours 03/30/24 1520 04/13/24 2359   03/28/24 1800  fluconazole  (DIFLUCAN ) tablet 200 mg        200 mg Oral Weekly 03/28/24 1206 05/02/24 1759   03/21/24 2200  meropenem  (MERREM ) 1 g in sodium chloride  0.9 % 100 mL IVPB  Status:  Discontinued        1 g 200 mL/hr over 30 Minutes Intravenous Every 24 hours 03/20/24 1041 03/20/24 1153   03/20/24 2200  meropenem  (MERREM ) 1 g in sodium chloride  0.9 % 100 mL IVPB  Status:  Discontinued        1 g 200 mL/hr over 30 Minutes Intravenous Every 24 hours 03/20/24 1153 03/30/24 1519   03/19/24 1200  vancomycin  (VANCOCIN ) IVPB 1000 mg/200 mL premix  Status:  Discontinued        1,000 mg 200 mL/hr over 60 Minutes Intravenous  Once 03/19/24 0907 03/19/24 0907   03/19/24 1200  vancomycin  (VANCOCIN ) IVPB 1000 mg/200 mL premix  Status:  Discontinued        1,000 mg 200 mL/hr over 60 Minutes Intravenous Every 24 hours 03/19/24 0907 03/19/24 1110   03/17/24 2200  ceFEPIme  (MAXIPIME ) 2 g in sodium chloride  0.9 % 100 mL IVPB  Status:  Discontinued        2 g 200 mL/hr over 30 Minutes Intravenous Every 24 hours 03/17/24 0142 03/17/24 0732   03/17/24 1445  meropenem  (MERREM ) 1 g in sodium chloride  0.9 % 100 mL IVPB  Status:  Discontinued        1 g 200 mL/hr over 30 Minutes Intravenous Every 8 hours 03/17/24 1354 03/20/24 1153   03/17/24 1445  vancomycin  (VANCOCIN ) 50 mg/mL oral solution SOLN 125 mg       Placed in Or Linked Group   125 mg Per Tube Daily 03/17/24 1354 03/31/24 2359   03/17/24 1445  vancomycin  (VANCOCIN ) capsule 125 mg       Placed in Or Linked Group   125 mg Oral Daily 03/17/24 1354 03/31/24 2359   03/17/24 1445  vancomycin  (VANCOCIN ) IVPB 1000 mg/200 mL premix  Status:   Discontinued        1,000 mg 200 mL/hr over 60 Minutes Intravenous Every 24 hours 03/17/24 1354 03/18/24 0923   03/17/24 1430  fidaxomicin  (DIFICID ) tablet 200 mg  Status:  Discontinued        200 mg Tube 2 times daily 03/17/24 1332 03/17/24 1351   03/17/24 1115  vancomycin  (VANCOCIN ) capsule 125 mg  Status:  Discontinued        125 mg Oral 4 times daily 03/17/24 1020 03/17/24 1354   03/17/24 1115  vancomycin  (VANCOCIN ) 50 mg/mL oral solution SOLN 125 mg  Status:  Discontinued       Placed in Followed by Linked Group   125 mg Per Tube 4 times daily 03/17/24 1020 03/17/24 1332   03/17/24 1000  meropenem  (MERREM ) 500 mg in sodium chloride  0.9 % 100 mL IVPB  Status:  Discontinued        500 mg 200 mL/hr over 30 Minutes Intravenous Every 24 hours 03/17/24 0732 03/17/24 1354   03/17/24 0145  metroNIDAZOLE  (FLAGYL ) IVPB 500 mg  Status:  Discontinued        500 mg 100 mL/hr over 60 Minutes Intravenous 2 times daily 03/17/24 0140 03/17/24 0732   03/16/24 2114  vancomycin  variable dose per unstable renal function (pharmacist dosing)  Status:  Discontinued         Does not apply See admin instructions 03/16/24 2114 03/17/24 1354   03/16/24 1945  ceFEPIme  (MAXIPIME ) 2 g in sodium chloride  0.9 % 100 mL IVPB        2 g 200 mL/hr over 30 Minutes Intravenous  Once 03/16/24 1934 03/16/24 2034   03/16/24 1930  vancomycin  (VANCOREADY) IVPB 2000 mg/400 mL        2,000 mg 200 mL/hr over 120 Minutes Intravenous  Once 03/16/24 1918 03/16/24 2247   03/16/24 1930  piperacillin -tazobactam (ZOSYN ) IVPB 4.5 g  Status:  Discontinued        4.5 g 200 mL/hr over 30 Minutes Intravenous  Once 03/16/24 1918 03/16/24 1934       Subjective:  Patient in bed, appears comfortable, denies any headache, no fever, no chest pain or pressure, no shortness of breath , no abdominal pain. No focal weakness.   Objective: Vitals:   04/03/24 1800 04/03/24 2232 04/04/24 0000 04/04/24 0800  BP: (!) 104/35 (!) 98/58 101/61 (!)  102/47  Pulse: 88 88 83   Resp: 20 20 18    Temp: 98.4 F (36.9 C)  98.9 F (37.2 C) 98.2 F (36.8 C)  TempSrc:   Oral Oral  SpO2: 100% 100% 100%   Weight:      Height:        Intake/Output Summary (Last 24 hours) at 04/04/2024 0932 Last data filed at 04/03/2024 2339 Gross per 24 hour  Intake 250 ml  Output 2000 ml  Net -1750 ml   Filed Weights   03/30/24 0848 03/30/24 1250 04/01/24 0500  Weight: (S) 105.4 kg (S) 104.2 kg 96.5 kg    Examination:  General exam: Overall comfortable, not in distress, chronically deconditioned, lying in hospital bed HEENT: PERRL, has right and left IJ central lines Respiratory system:  no wheezes or crackles, diminished sounds in bases Cardiovascular system: S1 & S2 heard, RRR.  Gastrointestinal system: Abdomen is nondistended, soft and nontender. Central nervous system: Alert and oriented Extremities: Dressing on the left thigh, right BKA,  ulcer on the left ankle and heel Skin: No rashes,no icterus      Data Review:   Patient Lines/Drains/Airways Status     Active Line/Drains/Airways     Name Placement date Placement time Site Days   Peripheral IV 03/20/24 20 G 2.5 Anterior;Proximal;Right;Upper Arm 03/20/24  1841  Arm  15   Fistula / Graft Left Upper arm Arteriovenous fistula 06/30/22  1200  Upper arm  644   Fistula / Graft Left Upper arm Arteriovenous fistula --  --  Upper arm  --   Hemodialysis Catheter Left Internal jugular Triple lumen Temporary (Non-Tunneled) 04/03/24  0951  Internal jugular  1   Wound 02/16/24 1900 Surgical Open Surgical Incision Groin Anterior;Left;Proximal 02/16/24  1900  Groin  48   Wound 02/19/24 0200 Pressure Injury Foot Left;Lateral Unstageable - Full thickness tissue loss in which the base of the injury is covered by slough (yellow, tan, gray, green or brown) and/or eschar (tan, brown or black) in the wound bed. 02/19/24  0200  Foot  45   Wound 03/17/24 0045 Pressure Injury Thigh Left;Posterior Stage 2 -   Partial thickness loss of dermis presenting as a shallow open injury with a red, pink wound bed without slough. 03/17/24  0045  Thigh  18   Wound 03/17/24 0045 Pressure Injury Hip Lateral;Right Stage 2 -  Partial thickness loss of dermis presenting as a shallow open injury with a red, pink wound bed without slough. 03/17/24  0045  Hip  18   Wound 03/17/24 0045 Pressure Injury Abdomen Lower;Left Stage 2 -  Partial thickness loss of dermis presenting as a shallow open injury with a red, pink wound bed without slough. 03/17/24  0045  Abdomen  18   Wound 03/17/24 1119 Pressure Injury Buttocks Right;Left;Medial Stage 3 -  Full thickness tissue loss. Subcutaneous fat may be visible but bone, tendon or muscle are NOT exposed. 03/17/24  1119  Buttocks  18   Wound 04/01/24 1500 Surgical Thigh Anterior;Distal;Left 04/01/24  1500  Thigh  3             Inpatient Medications  Scheduled Meds:  amiodarone   200 mg Oral Daily   ascorbic acid   500 mg Oral BID   atorvastatin   10 mg Oral Daily   busPIRone   5 mg Oral TID   collagenase    Topical Daily   darbepoetin (ARANESP ) injection - DIALYSIS  150 mcg Subcutaneous Q Tue-1800   famotidine   20 mg Oral QHS   feeding supplement  1 Container Oral Q24H   fluconazole   200 mg Oral Weekly   heparin  injection (subcutaneous)  5,000 Units Subcutaneous Q8H   midodrine   15 mg Oral Q8H   multivitamin  1 tablet Oral QHS   mouth rinse  15 mL Mouth Rinse 4 times per day   potassium chloride   40 mEq Oral Once   risperiDONE   1 mg Oral QHS   sodium chloride  flush  10-40 mL Intracatheter Q12H   vancomycin   125 mg Oral Daily   Continuous Infusions:  calcium  gluconate     ertapenem  500 mg (04/03/24 2329)   linezolid  (ZYVOX ) IV 600 mg (04/03/24 2252)   PRN Meds:.acetaminophen , acetaminophen , HYDROmorphone  (DILAUDID ) injection, hydrOXYzine , mouth rinse, polyethylene glycol, senna, traMADol   DVT Prophylaxis  heparin  injection 5,000 Units Start: 04/02/24 1400 Place and  maintain sequential compression device Start: 03/20/24 1800   Recent Labs  Lab 03/30/24 0846 03/30/24 1454 04/01/24 1401 04/01/24 1902 04/02/24 0548 04/03/24 1200  WBC 15.1* 16.1*  --  20.7* 19.0* 14.3*  HGB 6.7* 9.7* 10.2* 8.9* 8.7* 8.6*  HCT 20.4* 28.3* 30.0* 27.0* 24.9* 25.3*  PLT 337 324  --  259 251 237  MCV 98.6 92.5  --  96.1 92.6 94.8  MCH 32.4 31.7  --  31.7 32.3 32.2  MCHC 32.8 34.3  --  33.0 34.9 34.0  RDW 19.2* 19.1*  --  18.3* 18.4* 17.8*  LYMPHSABS 2.0  --   --  0.8  --   --  MONOABS 0.7  --   --  0.3  --   --   EOSABS 0.2  --   --  0.0  --   --   BASOSABS 0.1  --   --  0.1  --   --     Recent Labs  Lab 03/29/24 1230 03/30/24 0846 04/01/24 1401 04/01/24 1901 04/01/24 1902 04/02/24 0548 04/03/24 1200  NA 138 140 140  --  140 139 139  K 3.8 3.5 3.4*  --  3.7 3.8 3.1*  CL 102 106  --   --  102 100 101  CO2 24 23  --   --  24 23 23   ANIONGAP 12 12  --   --  14 16* 14  GLUCOSE 163* 85  --   --  117* 99 145*  BUN 22* 25*  --   --  23* 26* 32*  CREATININE 3.84* 4.31*  --   --  4.09* 4.19* 5.04*  AST  --   --   --   --  14*  --   --   ALT  --   --   --   --  6  --   --   ALKPHOS  --   --   --   --  61  --   --   BILITOT  --   --   --   --  0.9  --   --   ALBUMIN  1.9* 2.6*  --   --  3.4*  --   --   LATICACIDVEN  --   --   --  0.7  --  0.9  --   MG 1.9 2.1  --   --  1.8 2.0  --   PHOS 4.3 4.3  --   --  3.8 4.3  --   CALCIUM  7.4* 8.0*  --   --  7.9* 8.1* 8.2*      Recent Labs  Lab 03/29/24 1230 03/30/24 0846 04/01/24 1901 04/01/24 1902 04/02/24 0548 04/03/24 1200  LATICACIDVEN  --   --  0.7  --  0.9  --   MG 1.9 2.1  --  1.8 2.0  --   CALCIUM  7.4* 8.0*  --  7.9* 8.1* 8.2*    --------------------------------------------------------------------------------------------------------------- Lab Results  Component Value Date   CHOL 104 07/22/2019   HDL 30 (L) 07/22/2019   LDLCALC 37 07/22/2019   TRIG 185 (H) 07/22/2019   CHOLHDL 3.5 07/22/2019     Lab Results  Component Value Date   HGBA1C 4.3 (L) 02/15/2024   No results for input(s): TSH, T4TOTAL, FREET4, T3FREE, THYROIDAB in the last 72 hours. No results for input(s): VITAMINB12, FOLATE, FERRITIN, TIBC, IRON , RETICCTPCT in the last 72 hours. ------------------------------------------------------------------------------------------------------------------ Cardiac Enzymes No results for input(s): CKMB, TROPONINI, MYOGLOBIN in the last 168 hours.  Invalid input(s): CK  Micro Results No results found for this or any previous visit (from the past 240 hours).  Radiology Reports  IR NON-TUNNELED CENTRAL VENOUS CATH [facility] W IMG Result Date: 04/03/2024 INDICATION: End-stage renal disease. Tunneled dialysis catheter was removed on 03/30/2022 due to bacteremia. Patient needs new temporary dialysis access. EXAM: FLUOROSCOPIC AND ULTRASOUND GUIDED PLACEMENT OF A NON-TUNNELED DIALYSIS CATHETER Physician: Juliene SAUNDERS. Henn, MD MEDICATIONS: 1% lidocaine  for local anesthetic ANESTHESIA/SEDATION: None FLUOROSCOPY TIME:  Radiation Exposure Index (as provided by the fluoroscopic device): 7 mGy Kerma COMPLICATIONS: None immediate. PROCEDURE: Informed consent was obtained for catheter placement. The patient was placed supine  on the interventional table. Ultrasound confirmed a patent left internal jugular vein. Ultrasound images were obtained for documentation. The left neck was prepped and draped in a sterile fashion. Maximal barrier sterile technique was utilized including caps, mask, sterile gowns, sterile gloves, sterile drape, hand hygiene and skin antiseptic. The right neck was anesthetized with 1% lidocaine . A small incision was made with #11 blade scalpel. A 21 gauge needle directed into the left internal jugular vein with ultrasound guidance. A micropuncture dilator set was placed. J wire was placed and a Kumpe catheter was advanced to the IVC. J wire was exchanged for a  superstiff Amplatz wire. A 20 cm Mahurkar catheter was selected. The catheter was advanced over a wire and positioned at the superior cavoatrial junction. Fluoroscopic images were obtained for documentation. Both dialysis lumens were found to aspirate and flush well. The proper amount of heparin  was flushed in both lumens. The central venous lumen was flushed with normal saline. Catheter was sutured to skin. FINDINGS: Catheter tip at the superior cavoatrial junction. IMPRESSION: Successful placement of a left jugular non-tunneled dialysis catheter using ultrasound and fluoroscopic guidance. Electronically Signed   By: Juliene Balder M.D.   On: 04/03/2024 10:09   CT TIBIA FIBULA LEFT WO CONTRAST Result Date: 04/03/2024 EXAM: CT Left Lower Extremity, Without IV Contrast 04/02/2024 03:48:57 PM TECHNIQUE: Axial images were acquired through the left lower extremity without IV contrast. Reformatted images were reviewed. Automated exposure control, iterative reconstruction, and/or weight based adjustment of the mA/kV was utilized to reduce the radiation dose to as low as reasonably achievable. COMPARISON: None available. CLINICAL HISTORY: Left calf edema, infection of left thigh debrided 01/25. FINDINGS: BONES AND JOINTS: Severe osteoarthritis of the left knee. There is at least a small left knee effusion. Expected small free osteochondral fragments posteriorly in the knee joint and then a small Baker cyst. Substantial osteoarthritis at the tibiotalar articulation with loss of articular space and spurring. Degenerative spurring along the Chopart joint and in the midfoot. Confluent subcutaneous edema superficial to the posteromedial margin of the calcaneus, there is some underlying spurring in this vicinity but no definite bony destructive findings to indicate osteomyelitis. No bony destructive findings characteristic of osteomyelitis. No acute fracture or focal osseous lesion. No dislocation. SOFT TISSUES: Atheromatous  vascular calcifications. Posterior to the distal tibia for example on image 37 series 9, possibly from wound or ulceration. Subcutaneous edema in the calf becomes circumferential approaching the ankle what is primarily posterior and anteromedial elsewhere in the calf. Cellulitis is not excluded. Moderate regional muscular atrophy. No drainable abscess identified. Thickened medial band of the plantar fascia, cannot exclude plantar fasciitis. No findings characteristic for abscess identified. IMPRESSION: 1. Subcutaneous edema in the calf, primarily posterior and anteromedial, becoming circumferential near the ankle, with possible cellulitis; no drainable abscess and no CT findings of osteomyelitis. 2. Soft tissue abnormality posterior to the distal tibia, consistent with wound or ulceration. 3. Severe osteoarthritis of the left knee with a small effusion, small Baker cyst, and small posterior intra-articular osteochondral fragments. 4. Substantial osteoarthritis at the tibiotalar articulation with joint space loss and spurring. 5. Degenerative spurring along the Chopart joint and in the midfoot, with calcaneal spurring along the posteromedial margin. 6. Thickened medial band of the plantar fascia, possible plantar fasciitis. 7. Moderate regional muscular atrophy. 8. Atheromatous vascular calcifications. 9. No acute osseous abnormality. Electronically signed by: Ryan Salvage MD 04/03/2024 08:09 AM EST RP Workstation: [id number]      Signature  -  Lavada Stank M.D on 04/04/2024 at 9:33 AM   -  To page go to [email or web address]      P1/28/2026, 9:32 AM  "

## 2024-04-05 DIAGNOSIS — R7881 Bacteremia: Secondary | ICD-10-CM | POA: Diagnosis not present

## 2024-04-05 LAB — RENAL FUNCTION PANEL
Albumin: 2.6 g/dL — ABNORMAL LOW (ref 3.5–5.0)
Anion gap: 12 (ref 5–15)
BUN: 21 mg/dL — ABNORMAL HIGH (ref 6–20)
CO2: 24 mmol/L (ref 22–32)
Calcium: 7.8 mg/dL — ABNORMAL LOW (ref 8.9–10.3)
Chloride: 103 mmol/L (ref 98–111)
Creatinine, Ser: 4.39 mg/dL — ABNORMAL HIGH (ref 0.44–1.00)
GFR, Estimated: 11 mL/min — ABNORMAL LOW
Glucose, Bld: 77 mg/dL (ref 70–99)
Phosphorus: 2.2 mg/dL — ABNORMAL LOW (ref 2.5–4.6)
Potassium: 3.8 mmol/L (ref 3.5–5.1)
Sodium: 138 mmol/L (ref 135–145)

## 2024-04-05 LAB — GLUCOSE, CAPILLARY
Glucose-Capillary: 63 mg/dL — ABNORMAL LOW (ref 70–99)
Glucose-Capillary: 55 mg/dL — ABNORMAL LOW (ref 70–99)
Glucose-Capillary: 140 mg/dL — ABNORMAL HIGH (ref 70–99)
Glucose-Capillary: 63 mg/dL — ABNORMAL LOW (ref 70–99)
Glucose-Capillary: 121 mg/dL — ABNORMAL HIGH (ref 70–99)
Glucose-Capillary: 66 mg/dL — ABNORMAL LOW (ref 70–99)
Glucose-Capillary: 76 mg/dL (ref 70–99)
Glucose-Capillary: 104 mg/dL — ABNORMAL HIGH (ref 70–99)

## 2024-04-05 LAB — CBC
HCT: 25.6 % — ABNORMAL LOW (ref 36.0–46.0)
Hemoglobin: 8.3 g/dL — ABNORMAL LOW (ref 12.0–15.0)
MCH: 31.7 pg (ref 26.0–34.0)
MCHC: 32.4 g/dL (ref 30.0–36.0)
MCV: 97.7 fL (ref 80.0–100.0)
Platelets: 238 10*3/uL (ref 150–400)
RBC: 2.62 MIL/uL — ABNORMAL LOW (ref 3.87–5.11)
RDW: 18 % — ABNORMAL HIGH (ref 11.5–15.5)
WBC: 15.8 10*3/uL — ABNORMAL HIGH (ref 4.0–10.5)
nRBC: 0 % (ref 0.0–0.2)

## 2024-04-05 MED ORDER — ALTEPLASE 2 MG IJ SOLR: 2.0000 mg | Freq: Once | INTRAMUSCULAR | Status: DC | PRN

## 2024-04-05 MED ORDER — PENTAFLUOROPROP-TETRAFLUOROETH EX AERO: 1.0000 | INHALATION_SPRAY | CUTANEOUS | Status: DC | PRN

## 2024-04-05 MED ORDER — DEXTROSE 50 % IV SOLN
12.5000 g | INTRAVENOUS | Status: AC
  Administered 2024-04-05: 12.5 g via INTRAVENOUS
  Filled 2024-04-05 (×2): qty 50

## 2024-04-05 MED ORDER — DEXTROSE 50 % IV SOLN: 12.5000 g | INTRAVENOUS | Status: AC

## 2024-04-05 MED ORDER — LIDOCAINE-PRILOCAINE 2.5-2.5 % EX CREA: 1.0000 | TOPICAL_CREAM | CUTANEOUS | Status: DC | PRN

## 2024-04-05 MED ORDER — LIDOCAINE HCL (PF) 1 % IJ SOLN: 5.0000 mL | INTRAMUSCULAR | Status: DC | PRN

## 2024-04-05 MED ORDER — ALTEPLASE 2 MG IJ SOLR
2.0000 mg | Freq: Once | INTRAMUSCULAR | Status: DC
  Filled 2024-04-05: qty 2

## 2024-04-05 MED ORDER — GLUCOSE 40 % PO GEL
1.0000 | ORAL | Status: AC
  Filled 2024-04-05: qty 1.21

## 2024-04-05 NOTE — Progress Notes (Signed)
 " [facility] Progress Note   Subjective:   Seen in room, appears tired but awakens to voice and oriented to person, place, time. Denies SOB, CP, dizziness, nausea, leg pain. A little bit of an appetite   Objective Vitals:   04/04/24 2200 04/05/24 0000 04/05/24 0400 04/05/24 0700  BP:  (!) 101/45  (!) 84/48  Pulse: 81 79    Resp: (!) 24 17    Temp:  99 F (37.2 C) 99.2 F (37.3 C) 99 F (37.2 C)  TempSrc:  Oral Axillary Axillary  SpO2: 95% 98%    Weight:      Height:       Physical Exam General: chronically ill appearing female in NAD Heart: RRR, no murmur Lungs: CTA bilaterally, respirations unlabored Abdomen: Soft, non-distended, +BS Extremities: 1-2+ edema bilateral lower extremities tender, rt LE amputation Dialysis Access: LIJ temp (1/27), lt BCF pulsatile and augments but very floppy skin  Additional Objective Labs: Basic Metabolic Panel: Recent Labs  Lab 03/30/24 0846 04/01/24 1401 04/01/24 1902 04/02/24 0548 04/03/24 1200  NA 140   < > 140 139 139  K 3.5   < > 3.7 3.8 3.1*  CL 106  --  102 100 101  CO2 23  --  24 23 23   GLUCOSE 85  --  117* 99 145*  BUN 25*  --  23* 26* 32*  CREATININE 4.31*  --  4.09* 4.19* 5.04*  CALCIUM  8.0*  --  7.9* 8.1* 8.2*  PHOS 4.3  --  3.8 4.3  --    < > = values in this interval not displayed.   Liver Function Tests: Recent Labs  Lab 03/29/24 1230 03/30/24 0846 04/01/24 1902  AST  --   --  14*  ALT  --   --  6  ALKPHOS  --   --  61  BILITOT  --   --  0.9  PROT  --   --  4.9*  ALBUMIN  1.9* 2.6* 3.4*   Recent Labs  Lab 04/01/24 1902  LIPASE <10*  AMYLASE 20*   CBC: Recent Labs  Lab 03/30/24 0846 03/30/24 1454 04/01/24 1401 04/01/24 1902 04/02/24 0548 04/03/24 1200  WBC 15.1* 16.1*  --  20.7* 19.0* 14.3*  NEUTROABS 11.9*  --   --  19.4*  --   --   HGB 6.7* 9.7*   < > 8.9* 8.7* 8.6*  HCT 20.4* 28.3*   < > 27.0* 24.9* 25.3*  MCV 98.6 92.5  --  96.1 92.6 94.8  PLT 337 324  --  259 251 237    < > = values in this interval not displayed.   Blood Culture    Component Value Date/Time   SDES BLOOD RIGHT ANTECUBITAL 03/16/2024 1949   SPECREQUEST  03/16/2024 1949    BOTTLES DRAWN AEROBIC AND ANAEROBIC Blood Culture results may not be optimal due to an inadequate volume of blood received in culture bottles   CULT (A) 03/16/2024 1949    KLEBSIELLA PNEUMONIAE Confirmed Extended Spectrum Beta-Lactamase Producer (ESBL).  In bloodstream infections from ESBL organisms, carbapenems are preferred over piperacillin /tazobactam. They are shown to have a lower risk of mortality.    REPTSTATUS 03/19/2024 FINAL 03/16/2024 1949    Cardiac Enzymes: No results for input(s): CKTOTAL, CKMB, CKMBINDEX, TROPONINI in the last 168 hours. CBG: Recent Labs  Lab 04/04/24 0814 04/04/24 1206 04/04/24 1557 04/04/24 2110 04/05/24 0720  GLUCAP 76 87 80 99 63*   Iron  Studies: No results for  input(s): IRON , TIBC, TRANSFERRIN, FERRITIN in the last 72 hours. @lablastinr3 @ Studies/Results: IR NON-TUNNELED CENTRAL VENOUS CATH [facility] Result Date: 04/03/2024 INDICATION: End-stage renal disease. Tunneled dialysis catheter was removed on 03/30/2022 due to bacteremia. Patient needs new temporary dialysis access. EXAM: FLUOROSCOPIC AND ULTRASOUND GUIDED PLACEMENT OF A NON-TUNNELED DIALYSIS CATHETER Physician: Juliene SAUNDERS. Henn, MD MEDICATIONS: 1% lidocaine  for local anesthetic ANESTHESIA/SEDATION: None FLUOROSCOPY TIME:  Radiation Exposure Index (as provided by the fluoroscopic device): 7 mGy Kerma COMPLICATIONS: None immediate. PROCEDURE: Informed consent was obtained for catheter placement. The patient was placed supine on the interventional table. Ultrasound confirmed a patent left internal jugular vein. Ultrasound images were obtained for documentation. The left neck was prepped and draped in a sterile fashion. Maximal barrier sterile technique was utilized including caps, mask, sterile gowns, sterile  gloves, sterile drape, hand hygiene and skin antiseptic. The right neck was anesthetized with 1% lidocaine . A small incision was made with #11 blade scalpel. A 21 gauge needle directed into the left internal jugular vein with ultrasound guidance. A micropuncture dilator set was placed. J wire was placed and a Kumpe catheter was advanced to the IVC. J wire was exchanged for a superstiff Amplatz wire. A 20 cm Mahurkar catheter was selected. The catheter was advanced over a wire and positioned at the superior cavoatrial junction. Fluoroscopic images were obtained for documentation. Both dialysis lumens were found to aspirate and flush well. The proper amount of heparin  was flushed in both lumens. The central venous lumen was flushed with normal saline. Catheter was sutured to skin. FINDINGS: Catheter tip at the superior cavoatrial junction. IMPRESSION: Successful placement of a left jugular non-tunneled dialysis catheter using ultrasound and fluoroscopic guidance. Electronically Signed   By: Juliene Balder M.D.   On: 04/03/2024 10:09   Medications:  ertapenem  Stopped (04/04/24 2324)    amiodarone   200 mg Oral Daily   ascorbic acid   500 mg Oral BID   atorvastatin   10 mg Oral Daily   busPIRone   5 mg Oral TID   collagenase    Topical Daily   darbepoetin (ARANESP ) injection - DIALYSIS  150 mcg Subcutaneous Q Wed-1800   famotidine   20 mg Oral QHS   feeding supplement  1 Container Oral Q24H   fluconazole   200 mg Oral Weekly   heparin  injection (subcutaneous)  5,000 Units Subcutaneous Q8H   linezolid   600 mg Oral Q12H   midodrine   15 mg Oral Q8H   multivitamin  1 tablet Oral QHS   mouth rinse  15 mL Mouth Rinse 4 times per day   risperiDONE   1 mg Oral QHS   sodium chloride  flush  10-40 mL Intracatheter Q12H   vancomycin   125 mg Oral Daily    [facility] TTS 2nd shift 4hr 180NRe 400/600 EDW 96.1kg  3/2.5 Mircera 225mcg q2wk Hectorol   Assessment/Plan: ESRD - Usual TTS schedule - s/p CRRT 1/10-1/13  given significant shock - Dialyzed Friday 1/23 prior to [facility] removal. No acute indications for HD at this time. Appreciate VIR placing LIJ temp cath on 1/27 and tolerated HD Tues with 2L net UF. Plan next HD for Thur. Eventually will need TC (WBC 19k -> 14.3k). Should be able to get a TC early next week  Lt BCF is not usable at this time (very floppy skin and will only lead to infiltration)   HD today on TTS regimen  PAfib - cardiology following, back on PO amiodarone    Hypotension/volume - Septic on admit,  S/p pressors - Now  on high dose midodrine  20mg  TID + extra prn with HD. - Still up volume wise, UF as tolerated with HD   Sepsis/ESBL Klebsiella bacteremia - On ertapenem , has been ordered for outpatient (clinic will notify hospital when it arrives) - Blood Cx 03/16/24 positive - issues getting repeat cultures as she has horrible peripheral veins - TDC is out   Progressive dusky rash to pelvic/groin area, lower abd pannus and B legs  - seen by general surgery for worsening fever, hypotension -s/p debridement on 04/01/24 -on  antibiotics per primary team   Anemia of ESRD - Hgb 8.7 - S/p 2U PRBCs on 1/23 - Continue weekly Aranesp    Diarrhea - On empiric PO Vanc given Hx C.diff - Toxin negative on 1/10   T2DM - Insulin  per primary   Dispo/GOC - Palliative following.  - Plan is SNF      "

## 2024-04-05 NOTE — Plan of Care (Signed)
?  Problem: Clinical Measurements: ?Goal: Will remain free from infection ?Outcome: Progressing ?Goal: Diagnostic test results will improve ?Outcome: Progressing ?Goal: Respiratory complications will improve ?Outcome: Progressing ?  ?

## 2024-04-05 NOTE — Progress Notes (Signed)
 Tra sport arrived to patient room to pick her up for dialysis and patient refused. Inpatient nurse tried to convince patient to come without success. Nephrologist Lin notified.

## 2024-04-05 NOTE — Plan of Care (Signed)
" °  Problem: Education: Goal: Knowledge of General Education information will improve Description: Including pain rating scale, medication(s)/side effects and non-pharmacologic comfort measures Outcome: Progressing   Problem: Health Behavior/Discharge Planning: Goal: Ability to manage health-related needs will improve Outcome: Progressing   Problem: Clinical Measurements: Goal: Ability to maintain clinical measurements within normal limits will improve Outcome: Progressing Goal: Will remain free from infection Outcome: Progressing Goal: Diagnostic test results will improve Outcome: Progressing Goal: Respiratory complications will improve Outcome: Progressing Goal: Cardiovascular complication will be avoided Outcome: Progressing   Problem: Activity: Goal: Risk for activity intolerance will decrease Outcome: Progressing   Problem: Nutrition: Goal: Adequate nutrition will be maintained Outcome: Progressing   Problem: Coping: Goal: Level of anxiety will decrease Outcome: Progressing   Problem: Elimination: Goal: Will not experience complications related to bowel motility Outcome: Progressing Goal: Will not experience complications related to urinary retention Outcome: Progressing   Problem: Pain Managment: Goal: General experience of comfort will improve and/or be controlled Outcome: Progressing   Problem: Safety: Goal: Ability to remain free from injury will improve Outcome: Progressing   Problem: Skin Integrity: Goal: Risk for impaired skin integrity will decrease Outcome: Progressing   Problem: Education: Goal: Ability to describe self-care measures that may prevent or decrease complications (Diabetes Survival Skills Education) will improve Outcome: Progressing Goal: Individualized Educational Video(s) Outcome: Progressing   Problem: Coping: Goal: Ability to adjust to condition or change in health will improve Outcome: Progressing   Problem: Fluid  Volume: Goal: Ability to maintain a balanced intake and output will improve Outcome: Progressing   Problem: Health Behavior/Discharge Planning: Goal: Ability to identify and utilize available resources and services will improve Outcome: Progressing Goal: Ability to manage health-related needs will improve Outcome: Progressing   Problem: Metabolic: Goal: Ability to maintain appropriate glucose levels will improve Outcome: Progressing   Problem: Nutritional: Goal: Maintenance of adequate nutrition will improve Outcome: Progressing Goal: Progress toward achieving an optimal weight will improve Outcome: Progressing   Problem: Skin Integrity: Goal: Risk for impaired skin integrity will decrease Outcome: Progressing   Problem: Tissue Perfusion: Goal: Adequacy of tissue perfusion will improve Outcome: Progressing   Problem: Education: Goal: Knowledge of the prescribed therapeutic regimen will improve Outcome: Progressing   Problem: Bowel/Gastric: Goal: Gastrointestinal status for postoperative course will improve Outcome: Progressing   Problem: Cardiac: Goal: Ability to maintain an adequate cardiac output Outcome: Progressing Goal: Will show no evidence of cardiac arrhythmias Outcome: Progressing   Problem: Nutritional: Goal: Will attain and maintain optimal nutritional status Outcome: Progressing   Problem: Neurological: Goal: Will regain or maintain usual level of consciousness Outcome: Progressing   Problem: Clinical Measurements: Goal: Ability to maintain clinical measurements within normal limits Outcome: Progressing Goal: Postoperative complications will be avoided or minimized Outcome: Progressing   Problem: Respiratory: Goal: Will regain and/or maintain adequate ventilation Outcome: Progressing Goal: Respiratory status will improve Outcome: Progressing   Problem: Skin Integrity: Goal: Demonstrates signs of wound healing without infection Outcome:  Progressing   "

## 2024-04-05 NOTE — Consult Note (Signed)
 Wound care updated based on latest photos of sacrum/buttocks. Santyl  has been in use for 18 days, majority of wound appears red moist, small amount of tan. Majority does not require chemical debridement. Will DC Santyl  and use silver (Aquacel AG) to wound beds daily.    Thank you,    Powell Bar MSN, RN-BC, [employer]

## 2024-04-05 NOTE — Progress Notes (Signed)
 Transport arrived to take patient for HD but she refused.RN tried to convince and educate patient but still she refused,no any reason mentioned.Secure chat sent to Dr.Singh.

## 2024-04-05 NOTE — Progress Notes (Signed)
 IV Team requested to bedside for DBIV from pt's trialysis catheter.  Pigtail not giving blood return despite interventions.  Altaplase ordered. Primary RN notified lab collection will be delayed, unless phlebotomy is called to bedside.

## 2024-04-05 NOTE — Progress Notes (Signed)
 Nutrition Follow-up  DOCUMENTATION CODES:   Obesity unspecified  INTERVENTION:   Liberalize diet from renal to regular diet to maximize po intake. FLUID RESTRICTION REMAINS  Modify snacks in between meals.   Continued to encourage adequate nutritional intake.   If aggressive care within pt GOC, recommend nutrition support to maintain hydration status, electrolytes, and blood sugars.   NUTRITION DIAGNOSIS:   Moderate Malnutrition related to acute illness as evidenced by mild fat depletion, moderate muscle depletion, edema.  Remains applicable  GOAL:   Patient will meet greater than or equal to 90% of their needs  Not progressing, continuing to monitor intake.   MONITOR:   Diet advancement, TF tolerance, Skin, Weight trends, Labs, I & O's  REASON FOR ASSESSMENT:   Consult Assessment of nutrition requirement/status  ASSESSMENT:   59 y/o female with h/o HLD, stroke, fibromyalgia, NICM, HTN, IDDM, gastroparesis, cholecystectomy, substance abuse, gout, GERD, anxiety, PUD, C diff, NASH, duodenal carcinoid s/p resection (2022), chronic pain, PAF, CHF, ESRD on HD, gastric AVM, PVD s/p R BKA, TD and recently hospitalized 02/14/2024-02/25/2024 for sepsis secondary to left thigh abscess and underwent I&D with gen surg 02/16/2024 and who is anow admitted with AMS, syncope, septic shock and K pneumoniae bacteremia.  1/10- CRRT initiated 1/13- CRRT d/c 1/15- refused HD secondary to pain 1/14- TF stopped 1/16 cortrak d/c  1/17- rectal tube d/c, initially refused HD, but agreed to go 1/22- PIV unable to be completed due to poor vasculature 1/23- TDC removal; 2 units PRBCs transfused  1/25- s/p debridement of left thigh  1/27- temporary LIJ cath placed  1/28-  RIJ removed   Continued treatment for bacteremia. Pt refused HD today. S/p debridement with ongoing concern for nec fasc. Has undergone multiple line exchanges, WBC still elevated. Potential plan for [facility] placement next week.    Spoke to pt at bedside, resting in bed and somewhat tearful. Pt had just consumed breakfast upon arrival, she had a plate of home fried potatoes with ketchup and consumed approximately half. Pt said she does not want to eat d/t not wanting to use the bathroom stating it was painful. She reported renal diet to be too restrictive.   Noted with hypokalemia despite 3K bath with HD txs. Required repletion yesterday. Phosphorus well controled with no binders currently ordered. Required dextrose  this morning d/t low blood sugars. Given the significant inadequacy of oral intake, would recommend liberalizing diet to maximize po intake. Fluid restriction will remain in place. Pt was amenable to adding snacks in between meals to further augment intake.   Admit weight: 96.2 kg Current weight: 96.5 kg  Last HD treatment: 1/27 UF achieved: 2000 mL  EDW: 96.1  Last HD treatment with 2L UF, volume status improving but noted with some edema on exam. May benefit from fluid challenge. Bowel stable with 2 documented in last 24 hours.   Average Meal Intake: 1/24-1/27: 51% intake x 4 recorded meals  Nutritionally Relevant Medications: Scheduled Meds:  ascorbic acid   500 mg Oral BID   atorvastatin   10 mg Oral Daily   darbepoetin (ARANESP ) injection - DIALYSIS  150 mcg Subcutaneous Q Wed-1800   dextrose   12.5 g Intravenous STAT   famotidine   20 mg Oral QHS   feeding supplement  1 Container Oral Q24H   multivitamin  1 tablet Oral QHS    Labs Reviewed: 1/27 K: 3.1 BUN: 32 Creatinine: 5.04 Ca: 8.2  GFR: 9 CBG ranges from 55-140 mg/dL over the last 24 hours HgbA1c 4.3  Diet Order:   Diet Order             Diet renal with fluid restriction Fluid restriction: 1200 mL Fluid; Room service appropriate? Yes; Fluid consistency: Thin  Diet effective now                   EDUCATION NEEDS:   Not appropriate for education at this time  Skin:  Skin Assessment: Skin Integrity Issues: (Stage 3 PI  b/l medial buttocks, full thickness L groin s/p I&D 02/16/2024, Stage 2 Pressure Injury L posterior thigh pink) Skin Integrity Issues:: Stage II, Unstageable, Stage III Stage II: abdomen, R hip, L thigh Stage III: R buttocks Unstageable: L foot  Last BM:  1/28- type 7  Height:   Ht Readings from Last 1 Encounters:  03/28/24 5' 6 (1.676 m)    Weight:   Wt Readings from Last 1 Encounters:  04/01/24 96.5 kg    Ideal Body Weight:  59 kg  BMI:  Body mass index is 34.34 kg/m.  Estimated Nutritional Needs:   Kcal:  1700-1900  Protein:  80-90 gm  Fluid:  UOP +1L    Con Friends, Dietetic Intern

## 2024-04-05 NOTE — Progress Notes (Signed)
 Patient refused to have her lunch and afternoon snacks.Educated regarding the chances of hypoglycemia.

## 2024-04-05 NOTE — Progress Notes (Signed)
 " PROGRESS NOTE  Deborah [name]  FMW:[id number] DOB: [birth year] DOA: 03/16/2024 PCP: Pcp, No   Brief Narrative: Patient is a 59 year old female with past medical history of paroxysmal A-fib, hyperlipidemia, right BKA, ESRD on dialysis on TTS schedule who presented from skilled nursing facility for further evaluation of syncopal episode with loss of consciousness.  On presentation, she was hypotensive.  Lab work showed creatinine of 7, troponin of 105, lactic acid of 9.  Patient also reported diffuse abdominal pain.  Patient was admitted and was started on broad-spectrum antibiotics.  She was recently hospitalized on last December for sepsis secondary to left thigh abscess and underwent I&D with general surgery on 02/16/2024.  On this admission, she was found to have sepsis secondary to Klebsiella bacteremia and was on meropenem , she underwent multiple sessions of CRRT .  She was transferred to [facility] service on 03/22/2024 but remained hypotensive, patient was also refusing care.  Found to have new progressive bilateral thigh erythema and swelling, concern for necrotizing fasciitis.  General surgery consulted.  Underwent excisional debridement of the left thigh skin, subcutaneous tissue.  Was briefly transferred to ICU for that on 1/25.  Patient transferred back to [facility] service on 1/26. Underwent placement of left internal jugular temporary dialysis catheter by IR today  Major Hospital events:  1/9 ED s/p witnessed syncopal event w/ +LOC in shock, started on Levo/Vaso; Aline/CVC placed 1/10.  CT abdomen pelvis in the ER.  Nonacute.   1/10 CRRT, still on vaso, NE. Esclated to meropenem  for ESBL Klebsiella 1/11 echocardiogram EF 60%.  No acute changes 1/12: off levophed , on vasopressin  1/13: off vasopressin , steroids, and CRRT 1/14: transfer out of ICU, TRH to pick up 1/15 1/23: Transfused units PRBC for hemoglobin of 6.7 1/23: TDC catheter continued by IR 1/25: Remains febrile, with rapidly  progressive bilateral groin/thigh/lower abdomen area erythema swelling concerning for necrotizing fasciitis 1/27: Underwent placement of left internal jugular temporary dialysis catheter by IR Right IJ central line removed 04/04/2024    Assessment & Plan:  Suspected necrotizing fasciitis/cellulitis of left lower extremity, history of abscess in the left thigh before:   Developed rapidly progressive lower extremity edema.  There was concern for necrotizing fasciitis.  General surgery consulted.  Underwent excisional debridement of the left thigh on 04/02/24.  She also had a history of left groin abscess on last admission and underwent I&D by Dr. Vernetta on 02/16/2024.  At that time CT abdomen/pelvis did not show any acute findings.  She was also seen by orthopedics, Dr. Harden on 1/15.  ID and CCS following currently on combination of linezolid , ertapenem , fluconazole    Septic shock secondary to Klebsiella pneumonia bacteremia: Source of infection was unclear but now appears to be left thigh neck Fash, she also had an indwelling HD catheter which was removed and a new catheter placed this admission, ID following kindly see above.  ESRD on dialysis: Nephrology following.  Dialyzed on TTS schedule.  IR placed new temporary dialysis catheter on 1/27  Chronic hypotension: Required vasopressor therapy, currently on midodrine .  Currently off vasopressors.  Blood pressure stable   Opioid use disorder/substance abuse/history of fibromyalgia: Follows outpatient with [facility] pain clinic.  Minimize narcotics, history of narcotic seeking behavior, use narcotics with caution as blood pressure tends to fall with high doses.  Patient has been counseled multiple times.  History of C. difficile: C. difficile negative.  On prophylactic vancomycin  because she is on antibiotics  Anemia of chronic disease: Likely secondary to ESRD.  Given blood transfusion here.  Currently hemoglobin is stable Syncope: Echo showed  EF of 60 to 65%, mild LVH, a small pericardial effusion posteriorly.  Troponin elevation/paroxysmal A-fib: Not on anticoagulant due to history of GI bleed.  Seen by cardiology for RVR placed on amiodarone  this admission, currently in sinus, echo done which showed preserved EF of 60% and no wall motion abnormality.  Stable from the standpoint.   Hyperlipidemia: On statin  History of duodenal carcinoid tumor s/p resection and lung monitor/gastric ulcer/gastritis/chronic gastroparesis: On H2 blocker,  History of peripheral artery disease: Status post right BKA.  Stump is well-healed  Possible vaginal yeast infection: On weekly Diflucan   Anxiety/depression: On BuSpar , risperidone , trazodone  at home  Diabetes type 2: On sliding scale now.  A1c 4.3 as per 02/15/2024.  Will DC insulin   Debility/deconditioning: Patient is from SNF.  Plan for discharge to SNF when appropriate.  Long length of stay    Pressure ulcers:  Had wound on the left thigh, right hip, lower abdomen, buttocks, left heel.  Wound care nurse following .Dr. Harden was consulted to evaluate left thigh postop site.   Nutrition Problem: Moderate Malnutrition Etiology: acute illness Wound 02/19/24 0200 Pressure Injury Foot Left;Lateral Unstageable - Full thickness tissue loss in which the base of the injury is covered by slough (yellow, tan, gray, green or brown) and/or eschar (tan, brown or black) in the wound bed. (Active)     Wound 03/17/24 0045 Pressure Injury Thigh Left;Posterior Stage 2 -  Partial thickness loss of dermis presenting as a shallow open injury with a red, pink wound bed without slough. (Active)     Wound 03/17/24 0045 Pressure Injury Hip Lateral;Right Stage 2 -  Partial thickness loss of dermis presenting as a shallow open injury with a red, pink wound bed without slough. (Active)     Wound 03/17/24 0045 Pressure Injury Abdomen Lower;Left Stage 2 -  Partial thickness loss of dermis presenting as a shallow open  injury with a red, pink wound bed without slough. (Active)     Wound 03/17/24 1119 Pressure Injury Buttocks Right;Left;Medial Stage 3 -  Full thickness tissue loss. Subcutaneous fat may be visible but bone, tendon or muscle are NOT exposed. (Active)    DVT prophylaxis:heparin  injection 5,000 Units Start: 04/02/24 1400 Place and maintain sequential compression device Start: 03/20/24 1800     Code Status: Full Code  Family Communication: None at the bedside  Patient status: Inpatient inpatient  Patient is from : SNF  Anticipated discharge to: SNF  Estimated DC date: Not sure.  Needs clearance from ID,nephrology   Consultants: Nephrology, critical care, general surgery, cardiology, palliative care  Procedures: Dialysis, I&D  Antimicrobials:  Anti-infectives (From admission, onward)    Start     Dose/Rate Route Frequency Ordered Stop   04/22/24 1000  vancomycin  (VANCOCIN ) 50 mg/mL oral solution SOLN 125 mg  Status:  Discontinued       Placed in Followed by Linked Group   125 mg Per Tube Every 3 DAYS 03/17/24 1020 03/17/24 1332   04/14/24 1000  vancomycin  (VANCOCIN ) 50 mg/mL oral solution SOLN 125 mg  Status:  Discontinued       Placed in Followed by Linked Group   125 mg Per Tube Every other day 03/17/24 1020 03/17/24 1332   04/07/24 1000  vancomycin  (VANCOCIN ) 50 mg/mL oral solution SOLN 125 mg  Status:  Discontinued       Placed in Followed by Linked Group   125 mg Per Tube  Daily 03/17/24 1020 03/17/24 1332   04/04/24 2200  linezolid  (ZYVOX ) tablet 600 mg        600 mg Oral Every 12 hours 04/04/24 1005 04/16/24 0959   04/02/24 1300  vancomycin  (VANCOCIN ) capsule 125 mg        125 mg Oral Daily 04/02/24 1157 04/22/24 2359   04/01/24 1130  linezolid  (ZYVOX ) IVPB 600 mg  Status:  Discontinued        600 mg 300 mL/hr over 60 Minutes Intravenous Every 12 hours 04/01/24 1055 04/04/24 1005   04/01/24 1030  vancomycin  (VANCOREADY) IVPB 1500 mg/300 mL  Status:  Discontinued         1,500 mg 150 mL/hr over 120 Minutes Intravenous  Once 04/01/24 0953 04/01/24 2252   03/31/24 1000  vancomycin  (VANCOCIN ) 50 mg/mL oral solution SOLN 125 mg  Status:  Discontinued       Placed in Followed by Linked Group   125 mg Per Tube 2 times daily 03/17/24 1020 03/17/24 1332   03/30/24 2200  ertapenem  (INVANZ ) 500 mg in sodium chloride  0.9 % 50 mL IVPB        500 mg 100 mL/hr over 30 Minutes Intravenous Every 24 hours 03/30/24 1520 04/13/24 2359   03/28/24 1800  fluconazole  (DIFLUCAN ) tablet 200 mg        200 mg Oral Weekly 03/28/24 1206 05/02/24 1759   03/21/24 2200  meropenem  (MERREM ) 1 g in sodium chloride  0.9 % 100 mL IVPB  Status:  Discontinued        1 g 200 mL/hr over 30 Minutes Intravenous Every 24 hours 03/20/24 1041 03/20/24 1153   03/20/24 2200  meropenem  (MERREM ) 1 g in sodium chloride  0.9 % 100 mL IVPB  Status:  Discontinued        1 g 200 mL/hr over 30 Minutes Intravenous Every 24 hours 03/20/24 1153 03/30/24 1519   03/19/24 1200  vancomycin  (VANCOCIN ) IVPB 1000 mg/200 mL premix  Status:  Discontinued        1,000 mg 200 mL/hr over 60 Minutes Intravenous  Once 03/19/24 0907 03/19/24 0907   03/19/24 1200  vancomycin  (VANCOCIN ) IVPB 1000 mg/200 mL premix  Status:  Discontinued        1,000 mg 200 mL/hr over 60 Minutes Intravenous Every 24 hours 03/19/24 0907 03/19/24 1110   03/17/24 2200  ceFEPIme  (MAXIPIME ) 2 g in sodium chloride  0.9 % 100 mL IVPB  Status:  Discontinued        2 g 200 mL/hr over 30 Minutes Intravenous Every 24 hours 03/17/24 0142 03/17/24 0732   03/17/24 1445  meropenem  (MERREM ) 1 g in sodium chloride  0.9 % 100 mL IVPB  Status:  Discontinued        1 g 200 mL/hr over 30 Minutes Intravenous Every 8 hours 03/17/24 1354 03/20/24 1153   03/17/24 1445  vancomycin  (VANCOCIN ) 50 mg/mL oral solution SOLN 125 mg       Placed in Or Linked Group   125 mg Per Tube Daily 03/17/24 1354 03/31/24 2359   03/17/24 1445  vancomycin  (VANCOCIN ) capsule 125 mg        Placed in Or Linked Group   125 mg Oral Daily 03/17/24 1354 03/31/24 2359   03/17/24 1445  vancomycin  (VANCOCIN ) IVPB 1000 mg/200 mL premix  Status:  Discontinued        1,000 mg 200 mL/hr over 60 Minutes Intravenous Every 24 hours 03/17/24 1354 03/18/24 0923   03/17/24 1430  fidaxomicin  (DIFICID ) tablet 200 mg  Status:  Discontinued        200 mg Tube 2 times daily 03/17/24 1332 03/17/24 1351   03/17/24 1115  vancomycin  (VANCOCIN ) capsule 125 mg  Status:  Discontinued        125 mg Oral 4 times daily 03/17/24 1020 03/17/24 1354   03/17/24 1115  vancomycin  (VANCOCIN ) 50 mg/mL oral solution SOLN 125 mg  Status:  Discontinued       Placed in Followed by Linked Group   125 mg Per Tube 4 times daily 03/17/24 1020 03/17/24 1332   03/17/24 1000  meropenem  (MERREM ) 500 mg in sodium chloride  0.9 % 100 mL IVPB  Status:  Discontinued        500 mg 200 mL/hr over 30 Minutes Intravenous Every 24 hours 03/17/24 0732 03/17/24 1354   03/17/24 0145  metroNIDAZOLE  (FLAGYL ) IVPB 500 mg  Status:  Discontinued        500 mg 100 mL/hr over 60 Minutes Intravenous 2 times daily 03/17/24 0140 03/17/24 0732   03/16/24 2114  vancomycin  variable dose per unstable renal function (pharmacist dosing)  Status:  Discontinued         Does not apply See admin instructions 03/16/24 2114 03/17/24 1354   03/16/24 1945  ceFEPIme  (MAXIPIME ) 2 g in sodium chloride  0.9 % 100 mL IVPB        2 g 200 mL/hr over 30 Minutes Intravenous  Once 03/16/24 1934 03/16/24 2034   03/16/24 1930  vancomycin  (VANCOREADY) IVPB 2000 mg/400 mL        2,000 mg 200 mL/hr over 120 Minutes Intravenous  Once 03/16/24 1918 03/16/24 2247   03/16/24 1930  piperacillin -tazobactam (ZOSYN ) IVPB 4.5 g  Status:  Discontinued        4.5 g 200 mL/hr over 30 Minutes Intravenous  Once 03/16/24 1918 03/16/24 1934       Subjective:  Patient in bed, appears comfortable, denies any headache, no fever, no chest pain or pressure, no shortness of breath , no  abdominal pain. No focal weakness.  Objective: Vitals:   04/05/24 0400 04/05/24 0700 04/05/24 0800 04/05/24 0825  BP:  (!) 84/48 (!) 103/42   Pulse:   89 89  Resp:   20 17  Temp: 99.2 F (37.3 C) 99 F (37.2 C)    TempSrc: Axillary Axillary    SpO2:   97% 95%  Weight:      Height:        Intake/Output Summary (Last 24 hours) at 04/05/2024 1005 Last data filed at 04/05/2024 9272 Gross per 24 hour  Intake 200 ml  Output --  Net 200 ml   Filed Weights   03/30/24 0848 03/30/24 1250 04/01/24 0500  Weight: (S) 105.4 kg (S) 104.2 kg 96.5 kg    Examination:  General exam: Overall comfortable, not in distress, chronically deconditioned, lying in hospital bed HEENT: PERRL, has left IJ central line  Respiratory system:  no wheezes or crackles, diminished sounds in bases Cardiovascular system: S1 & S2 heard, RRR.  Gastrointestinal system: Abdomen is nondistended, soft and nontender. Central nervous system: Alert and oriented Extremities: Dressing on the left thigh, right BKA,  ulcer on the left ankle and heel Skin: No rashes,no icterus      Data Review:   Patient Lines/Drains/Airways Status     Active Line/Drains/Airways     Name Placement date Placement time Site Days   Peripheral IV 03/20/24 20 G 2.5 Anterior;Proximal;Right;Upper Arm 03/20/24  1841  Arm  16   Fistula /  Graft Left Upper arm Arteriovenous fistula 06/30/22  1200  Upper arm  645   Fistula / Graft Left Upper arm Arteriovenous fistula --  --  Upper arm  --   Hemodialysis Catheter Left Internal jugular Triple lumen Temporary (Non-Tunneled) 04/03/24  0951  Internal jugular  2   Wound 02/16/24 1900 Surgical Open Surgical Incision Groin Anterior;Left;Proximal 02/16/24  1900  Groin  49   Wound 02/19/24 0200 Pressure Injury Foot Left;Lateral Unstageable - Full thickness tissue loss in which the base of the injury is covered by slough (yellow, tan, gray, green or brown) and/or eschar (tan, brown or black) in the wound  bed. 02/19/24  0200  Foot  46   Wound 03/17/24 0045 Pressure Injury Thigh Left;Posterior Stage 2 -  Partial thickness loss of dermis presenting as a shallow open injury with a red, pink wound bed without slough. 03/17/24  0045  Thigh  19   Wound 03/17/24 0045 Pressure Injury Hip Lateral;Right Stage 2 -  Partial thickness loss of dermis presenting as a shallow open injury with a red, pink wound bed without slough. 03/17/24  0045  Hip  19   Wound 03/17/24 0045 Pressure Injury Abdomen Lower;Left Stage 2 -  Partial thickness loss of dermis presenting as a shallow open injury with a red, pink wound bed without slough. 03/17/24  0045  Abdomen  19   Wound 03/17/24 1119 Pressure Injury Buttocks Right;Left;Medial Stage 3 -  Full thickness tissue loss. Subcutaneous fat may be visible but bone, tendon or muscle are NOT exposed. 03/17/24  1119  Buttocks  19   Wound 04/01/24 1500 Surgical Thigh Anterior;Distal;Left 04/01/24  1500  Thigh  4             Inpatient Medications  Scheduled Meds:  amiodarone   200 mg Oral Daily   ascorbic acid   500 mg Oral BID   atorvastatin   10 mg Oral Daily   busPIRone   5 mg Oral TID   collagenase    Topical Daily   darbepoetin (ARANESP ) injection - DIALYSIS  150 mcg Subcutaneous Q Wed-1800   dextrose   12.5 g Intravenous STAT   famotidine   20 mg Oral QHS   feeding supplement  1 Container Oral Q24H   fluconazole   200 mg Oral Weekly   heparin  injection (subcutaneous)  5,000 Units Subcutaneous Q8H   linezolid   600 mg Oral Q12H   midodrine   15 mg Oral Q8H   multivitamin  1 tablet Oral QHS   mouth rinse  15 mL Mouth Rinse 4 times per day   risperiDONE   1 mg Oral QHS   sodium chloride  flush  10-40 mL Intracatheter Q12H   vancomycin   125 mg Oral Daily   Continuous Infusions:  ertapenem  Stopped (04/04/24 2324)   PRN Meds:.acetaminophen , acetaminophen , HYDROmorphone  (DILAUDID ) injection, hydrOXYzine , mouth rinse, polyethylene glycol, senna, traMADol   DVT  Prophylaxis  heparin  injection 5,000 Units Start: 04/02/24 1400 Place and maintain sequential compression device Start: 03/20/24 1800   Recent Labs  Lab 03/30/24 0846 03/30/24 1454 04/01/24 1401 04/01/24 1902 04/02/24 0548 04/03/24 1200  WBC 15.1* 16.1*  --  20.7* 19.0* 14.3*  HGB 6.7* 9.7* 10.2* 8.9* 8.7* 8.6*  HCT 20.4* 28.3* 30.0* 27.0* 24.9* 25.3*  PLT 337 324  --  259 251 237  MCV 98.6 92.5  --  96.1 92.6 94.8  MCH 32.4 31.7  --  31.7 32.3 32.2  MCHC 32.8 34.3  --  33.0 34.9 34.0  RDW 19.2* 19.1*  --  18.3*  18.4* 17.8*  LYMPHSABS 2.0  --   --  0.8  --   --   MONOABS 0.7  --   --  0.3  --   --   EOSABS 0.2  --   --  0.0  --   --   BASOSABS 0.1  --   --  0.1  --   --     Recent Labs  Lab 03/29/24 1230 03/30/24 0846 04/01/24 1401 04/01/24 1901 04/01/24 1902 04/02/24 0548 04/03/24 1200  NA 138 140 140  --  140 139 139  K 3.8 3.5 3.4*  --  3.7 3.8 3.1*  CL 102 106  --   --  102 100 101  CO2 24 23  --   --  24 23 23   ANIONGAP 12 12  --   --  14 16* 14  GLUCOSE 163* 85  --   --  117* 99 145*  BUN 22* 25*  --   --  23* 26* 32*  CREATININE 3.84* 4.31*  --   --  4.09* 4.19* 5.04*  AST  --   --   --   --  14*  --   --   ALT  --   --   --   --  6  --   --   ALKPHOS  --   --   --   --  61  --   --   BILITOT  --   --   --   --  0.9  --   --   ALBUMIN  1.9* 2.6*  --   --  3.4*  --   --   LATICACIDVEN  --   --   --  0.7  --  0.9  --   MG 1.9 2.1  --   --  1.8 2.0  --   PHOS 4.3 4.3  --   --  3.8 4.3  --   CALCIUM  7.4* 8.0*  --   --  7.9* 8.1* 8.2*      Recent Labs  Lab 03/29/24 1230 03/30/24 0846 04/01/24 1901 04/01/24 1902 04/02/24 0548 04/03/24 1200  LATICACIDVEN  --   --  0.7  --  0.9  --   MG 1.9 2.1  --  1.8 2.0  --   CALCIUM  7.4* 8.0*  --  7.9* 8.1* 8.2*    --------------------------------------------------------------------------------------------------------------- Lab Results  Component Value Date   CHOL 104 07/22/2019   HDL 30 (L) 07/22/2019    LDLCALC 37 07/22/2019   TRIG 185 (H) 07/22/2019   CHOLHDL 3.5 07/22/2019    Lab Results  Component Value Date   HGBA1C 4.3 (L) 02/15/2024   No results for input(s): TSH, T4TOTAL, FREET4, T3FREE, THYROIDAB in the last 72 hours. No results for input(s): VITAMINB12, FOLATE, FERRITIN, TIBC, IRON , RETICCTPCT in the last 72 hours. ------------------------------------------------------------------------------------------------------------------ Cardiac Enzymes No results for input(s): CKMB, TROPONINI, MYOGLOBIN in the last 168 hours.  Invalid input(s): CK  Micro Results No results found for this or any previous visit (from the past 240 hours).  Radiology Reports  No results found.     Signature  -   Lavada Stank M.D on 04/05/2024 at 10:05 AM   -  To page go to [email or web address]      P1/29/2026, 10:05 AM  "

## 2024-04-06 DIAGNOSIS — R7881 Bacteremia: Secondary | ICD-10-CM | POA: Diagnosis not present

## 2024-04-06 LAB — GLUCOSE, CAPILLARY
Glucose-Capillary: 64 mg/dL — ABNORMAL LOW (ref 70–99)
Glucose-Capillary: 82 mg/dL (ref 70–99)
Glucose-Capillary: 88 mg/dL (ref 70–99)
Glucose-Capillary: 71 mg/dL (ref 70–99)
Glucose-Capillary: 73 mg/dL (ref 70–99)
Glucose-Capillary: 79 mg/dL (ref 70–99)

## 2024-04-06 MED ORDER — ERTAPENEM IV (FOR PTA / DISCHARGE USE ONLY): 1.0000 g | INTRAVENOUS | Status: AC

## 2024-04-06 NOTE — Progress Notes (Addendum)
" °  Informational Antimicrobial Plans with Dialysis   Indication: ESBL Klebsiella pneumoniae bacteremia complicated by suspected necrotizing fasciitis of left lower thigh/perineum  Regimen: Ertapenem  IV 1000 mg post-HD on dialysis days (usual HD schedule TTS)  End date: 04/13/2024  Comments: Per nephrology team, the outpatient HD clinic has the ertapenem  in stock.   Will also be on the following antimicrobials (discharge orders pended):  - Linezolid  600 mg PO twice daily (end date of 04/15/24) - Vancomycin  125 mg PO daily (end date of 04/22/2024) - Fluconazole  200 mg PO weekly (end date of 04/25/2024)    This is not an official OPAT note and is intended to be only informational that the patient will receive antimicrobial therapy after dialysis sessions.    Thank you for allowing pharmacy to be a part of this patient's care.   Feliciano Close, PharmD PGY2 Infectious Diseases Pharmacy Resident   "

## 2024-04-06 NOTE — Progress Notes (Signed)
 " [facility] Progress Note   Subjective:   Seen in room, appears tired but awakens to voice and oriented to person, place, time. Denies SOB, CP, dizziness, nausea, leg pain. A little bit of an appetite   Objective Vitals:   04/05/24 2000 04/06/24 0000 04/06/24 0400 04/06/24 0717  BP: (!) 102/54 (!) 117/55 (!) 113/57 (!) 109/56  Pulse: 82 83 91 87  Resp: (!) 32 (!) 34 (!) 35 (!) 38  Temp: (!) 97.2 F (36.2 C) (!) 97.5 F (36.4 C) 97.6 F (36.4 C) 98.6 F (37 C)  TempSrc: Oral Oral Oral Axillary  SpO2: 96% 98% 93% 95%  Weight:      Height:       Physical Exam General: chronically ill appearing female in NAD Heart: RRR, no murmur Lungs: CTA bilaterally, respirations unlabored Abdomen: Soft, non-distended, +BS Extremities: 1-2+ edema bilateral lower extremities tender, rt LE amputation Dialysis Access: LIJ temp (1/27), lt BCF pulsatile and augments but very floppy skin  Additional Objective Labs: Basic Metabolic Panel: Recent Labs  Lab 04/01/24 1902 04/02/24 0548 04/03/24 1200 04/05/24 1316  NA 140 139 139 138  K 3.7 3.8 3.1* 3.8  CL 102 100 101 103  CO2 24 23 23 24   GLUCOSE 117* 99 145* 77  BUN 23* 26* 32* 21*  CREATININE 4.09* 4.19* 5.04* 4.39*  CALCIUM  7.9* 8.1* 8.2* 7.8*  PHOS 3.8 4.3  --  2.2*   Liver Function Tests: Recent Labs  Lab 04/01/24 1902 04/05/24 1316  AST 14*  --   ALT 6  --   ALKPHOS 61  --   BILITOT 0.9  --   PROT 4.9*  --   ALBUMIN  3.4* 2.6*   Recent Labs  Lab 04/01/24 1902  LIPASE <10*  AMYLASE 20*   CBC: Recent Labs  Lab 03/30/24 1454 04/01/24 1401 04/01/24 1902 04/02/24 0548 04/03/24 1200 04/05/24 1316  WBC 16.1*  --  20.7* 19.0* 14.3* 15.8*  NEUTROABS  --   --  19.4*  --   --   --   HGB 9.7*   < > 8.9* 8.7* 8.6* 8.3*  HCT 28.3*   < > 27.0* 24.9* 25.3* 25.6*  MCV 92.5  --  96.1 92.6 94.8 97.7  PLT 324  --  259 251 237 238   < > = values in this interval not displayed.   Blood Culture    Component  Value Date/Time   SDES BLOOD RIGHT ANTECUBITAL 03/16/2024 1949   SPECREQUEST  03/16/2024 1949    BOTTLES DRAWN AEROBIC AND ANAEROBIC Blood Culture results may not be optimal due to an inadequate volume of blood received in culture bottles   CULT (A) 03/16/2024 1949    KLEBSIELLA PNEUMONIAE Confirmed Extended Spectrum Beta-Lactamase Producer (ESBL).  In bloodstream infections from ESBL organisms, carbapenems are preferred over piperacillin /tazobactam. They are shown to have a lower risk of mortality.    REPTSTATUS 03/19/2024 FINAL 03/16/2024 1949    Cardiac Enzymes: No results for input(s): CKTOTAL, CKMB, CKMBINDEX, TROPONINI in the last 168 hours. CBG: Recent Labs  Lab 04/05/24 1641 04/05/24 1707 04/05/24 1722 04/05/24 2108 04/06/24 0721  GLUCAP 66* 64* 76 104* 82   Iron  Studies: No results for input(s): IRON , TIBC, TRANSFERRIN, FERRITIN in the last 72 hours. @lablastinr3 @ Studies/Results: No results found.  Medications:  ertapenem  Stopped (04/06/24 0600)    alteplase   2 mg Intracatheter Once   amiodarone   200 mg Oral Daily   ascorbic acid   500 mg Oral BID  atorvastatin   10 mg Oral Daily   busPIRone   5 mg Oral TID   darbepoetin (ARANESP ) injection - DIALYSIS  150 mcg Subcutaneous Q Wed-1800   dextrose   1 Tube Oral STAT   dextrose   12.5 g Intravenous STAT   famotidine   20 mg Oral QHS   fluconazole   200 mg Oral Weekly   heparin  injection (subcutaneous)  5,000 Units Subcutaneous Q8H   linezolid   600 mg Oral Q12H   midodrine   15 mg Oral Q8H   multivitamin  1 tablet Oral QHS   mouth rinse  15 mL Mouth Rinse 4 times per day   risperiDONE   1 mg Oral QHS   sodium chloride  flush  10-40 mL Intracatheter Q12H   vancomycin   125 mg Oral Daily    [facility] TTS 2nd shift 4hr 180NRe 400/600 EDW 96.1kg  3/2.5 Mircera 225mcg q2wk Hectorol   Assessment/Plan: ESRD - Usual TTS schedule - s/p CRRT 1/10-1/13 given significant shock - Dialyzed Friday 1/23 prior to  [facility] removal. No acute indications for HD at this time. Appreciate VIR placing LIJ temp cath on 1/27 and tolerated HD Tues with 2L net UF. Eventually will need TC (WBC 19k -> 14.3k). Lt BCF is not usable at this time (very floppy skin and will only lead to infiltration)  Refused HD overnight and will rewrite orders for today and tomorrow. She is overloaded.  PAfib - cardiology following, back on PO amiodarone    Hypotension/volume - Septic on admit,  S/p pressors - Now on high dose midodrine  20mg  TID + extra prn with HD. - Still up volume wise, UF as tolerated with HD   Sepsis/ESBL Klebsiella bacteremia - On ertapenem , has been ordered for outpatient (clinic will notify hospital when it arrives) - Blood Cx 03/16/24 positive - issues getting repeat cultures as she has horrible peripheral veins - TDC is out   Progressive dusky rash to pelvic/groin area, lower abd pannus and B legs  - seen by general surgery for worsening fever, hypotension -s/p debridement on 04/01/24 -on  antibiotics per primary team   Anemia of ESRD - Hgb 8.7 - S/p 2U PRBCs on 1/23 - Continue weekly Aranesp    Diarrhea - On empiric PO Vanc given Hx C.diff - Toxin negative on 1/10   T2DM - Insulin  per primary   Dispo/GOC - Palliative following.  - Plan is SNF      "

## 2024-04-06 NOTE — TOC Progression Note (Signed)
 Transition of Care [facility]) - Progression Note    Patient Details  Name: Deborah [name] MRN: [id number] Date of Birth: [birth year]  [facility]) CM/SW Contact  Inocente GORMAN Kindle, LCSW Phone Number: 04/06/2024, 11:16 AM  Clinical Narrative:    CSW continuing to follow and monitor bed availability at [facility].   Expected Discharge Plan: Long Term Nursing Home Barriers to Discharge: Continued Medical Work up               Expected Discharge Plan and Services       Living arrangements for the past 2 months: Skilled Nursing Facility                                       Social Drivers of Health (SDOH) Interventions SDOH Screenings   Food Insecurity: No Food Insecurity (03/18/2024)  Housing: Low Risk (03/18/2024)  Transportation Needs: No Transportation Needs (03/18/2024)  Utilities: Not At Risk (03/18/2024)  Recent Concern: Utilities - At Risk (02/18/2024)  Depression (PHQ2-9): Medium Risk (03/16/2023)  Financial Resource Strain: Low Risk (03/23/2021)   Received from [facility])  Social Connections: Low Risk (03/23/2021)   Received from [facility])  Stress: Low Risk (03/23/2021)   Received from [facility])  Tobacco Use: Low Risk (04/01/2024)    Readmission Risk Interventions    03/28/2024    3:24 PM 02/20/2024   11:05 AM 03/07/2023    4:19 PM  Readmission Risk Prevention Plan  Transportation Screening Complete Complete Complete  Medication Review (RN Care Manager) Complete Complete Complete  PCP or Specialist appointment within 3-5 days of discharge Complete Complete Complete  HRI or Home Care Consult Complete Complete Complete  SW Recovery Care/Counseling Consult Complete Complete Complete  Palliative Care Screening Complete Not Applicable Complete  Skilled Nursing Facility Complete Complete Not Applicable

## 2024-04-06 NOTE — Progress Notes (Signed)
 Navigator advised by renal PA yesterday that [facility] has iv ertapenem  when pt is d/c back to snf. Will assist as needed.   Randine Mungo Dialysis Navigator [phone]

## 2024-04-07 DIAGNOSIS — R7881 Bacteremia: Secondary | ICD-10-CM | POA: Diagnosis not present

## 2024-04-07 LAB — CBC WITH DIFFERENTIAL/PLATELET
Abs Immature Granulocytes: 0.1 10*3/uL — ABNORMAL HIGH (ref 0.00–0.07)
Basophils Absolute: 0.1 10*3/uL (ref 0.0–0.1)
Basophils Relative: 0 %
Eosinophils Absolute: 0.8 10*3/uL — ABNORMAL HIGH (ref 0.0–0.5)
Eosinophils Relative: 5 %
HCT: 26 % — ABNORMAL LOW (ref 36.0–46.0)
Hemoglobin: 8.5 g/dL — ABNORMAL LOW (ref 12.0–15.0)
Immature Granulocytes: 1 %
Lymphocytes Relative: 15 %
Lymphs Abs: 2.1 10*3/uL (ref 0.7–4.0)
MCH: 31.8 pg (ref 26.0–34.0)
MCHC: 32.7 g/dL (ref 30.0–36.0)
MCV: 97.4 fL (ref 80.0–100.0)
Monocytes Absolute: 0.9 10*3/uL (ref 0.1–1.0)
Monocytes Relative: 7 %
Neutro Abs: 10.3 10*3/uL — ABNORMAL HIGH (ref 1.7–7.7)
Neutrophils Relative %: 72 %
Platelets: 232 10*3/uL (ref 150–400)
RBC: 2.67 MIL/uL — ABNORMAL LOW (ref 3.87–5.11)
RDW: 17.8 % — ABNORMAL HIGH (ref 11.5–15.5)
WBC: 14.3 10*3/uL — ABNORMAL HIGH (ref 4.0–10.5)
nRBC: 0 % (ref 0.0–0.2)

## 2024-04-07 LAB — CBC
HCT: 26.5 % — ABNORMAL LOW (ref 36.0–46.0)
Hemoglobin: 8.7 g/dL — ABNORMAL LOW (ref 12.0–15.0)
MCH: 32 pg (ref 26.0–34.0)
MCHC: 32.8 g/dL (ref 30.0–36.0)
MCV: 97.4 fL (ref 80.0–100.0)
Platelets: 267 10*3/uL (ref 150–400)
RBC: 2.72 MIL/uL — ABNORMAL LOW (ref 3.87–5.11)
RDW: 18 % — ABNORMAL HIGH (ref 11.5–15.5)
WBC: 17.7 10*3/uL — ABNORMAL HIGH (ref 4.0–10.5)
nRBC: 0 % (ref 0.0–0.2)

## 2024-04-07 LAB — BASIC METABOLIC PANEL WITH GFR
Anion gap: 15 (ref 5–15)
BUN: 28 mg/dL — ABNORMAL HIGH (ref 6–20)
CO2: 22 mmol/L (ref 22–32)
Calcium: 7.6 mg/dL — ABNORMAL LOW (ref 8.9–10.3)
Chloride: 101 mmol/L (ref 98–111)
Creatinine, Ser: 5.45 mg/dL — ABNORMAL HIGH (ref 0.44–1.00)
GFR, Estimated: 8 mL/min — ABNORMAL LOW
Glucose, Bld: 57 mg/dL — ABNORMAL LOW (ref 70–99)
Potassium: 4 mmol/L (ref 3.5–5.1)
Sodium: 138 mmol/L (ref 135–145)

## 2024-04-07 LAB — RENAL FUNCTION PANEL
Albumin: 2.9 g/dL — ABNORMAL LOW (ref 3.5–5.0)
Anion gap: 11 (ref 5–15)
BUN: 14 mg/dL (ref 6–20)
CO2: 26 mmol/L (ref 22–32)
Calcium: 7.9 mg/dL — ABNORMAL LOW (ref 8.9–10.3)
Chloride: 101 mmol/L (ref 98–111)
Creatinine, Ser: 3.31 mg/dL — ABNORMAL HIGH (ref 0.44–1.00)
GFR, Estimated: 15 mL/min — ABNORMAL LOW
Glucose, Bld: 83 mg/dL (ref 70–99)
Phosphorus: 2.4 mg/dL — ABNORMAL LOW (ref 2.5–4.6)
Potassium: 3.5 mmol/L (ref 3.5–5.1)
Sodium: 138 mmol/L (ref 135–145)

## 2024-04-07 LAB — GLUCOSE, CAPILLARY
Glucose-Capillary: 66 mg/dL — ABNORMAL LOW (ref 70–99)
Glucose-Capillary: 89 mg/dL (ref 70–99)
Glucose-Capillary: 88 mg/dL (ref 70–99)
Glucose-Capillary: 78 mg/dL (ref 70–99)
Glucose-Capillary: 99 mg/dL (ref 70–99)
Glucose-Capillary: 105 mg/dL — ABNORMAL HIGH (ref 70–99)

## 2024-04-07 LAB — MAGNESIUM: Magnesium: 1.9 mg/dL (ref 1.7–2.4)

## 2024-04-07 MED ORDER — ALBUMIN HUMAN 25 % IV SOLN
25.0000 g | Freq: Once | INTRAVENOUS | Status: AC
  Administered 2024-04-07: 25 g via INTRAVENOUS

## 2024-04-07 MED ORDER — HEPARIN SODIUM (PORCINE) 1000 UNIT/ML IJ SOLN
1000.0000 [IU] | INTRAMUSCULAR | Status: DC | PRN
  Administered 2024-04-07: 2800 [IU]

## 2024-04-07 MED ORDER — LOPERAMIDE HCL 2 MG PO CAPS
4.0000 mg | ORAL_CAPSULE | Freq: Once | ORAL | Status: AC
  Administered 2024-04-08: 4 mg via ORAL
  Filled 2024-04-07: qty 2

## 2024-04-07 MED ORDER — AMIODARONE HCL 200 MG PO TABS
200.0000 mg | ORAL_TABLET | Freq: Every day | ORAL | Status: AC
  Administered 2024-04-07 – 2024-04-13 (×6): 200 mg via ORAL
  Filled 2024-04-07 (×6): qty 1

## 2024-04-07 MED ORDER — ALBUMIN HUMAN 25 % IV SOLN
INTRAVENOUS | Status: AC
  Filled 2024-04-07: qty 100

## 2024-04-07 MED ORDER — AMIODARONE HCL 100 MG PO TABS: 100.0000 mg | ORAL_TABLET | Freq: Every day | ORAL | Status: DC

## 2024-04-07 MED ORDER — HEPARIN SODIUM (PORCINE) 1000 UNIT/ML IJ SOLN
INTRAMUSCULAR | Status: AC
  Filled 2024-04-07: qty 3

## 2024-04-07 NOTE — Progress Notes (Signed)
 " PROGRESS NOTE  Deborah [name]  FMW:[id number] DOB: 12-09-1965 DOA: 03/16/2024 PCP: Pcp, No   Brief Narrative: Patient is a 59 year old female with past medical history of paroxysmal A-fib, hyperlipidemia, right BKA, ESRD on dialysis on TTS schedule who presented from skilled nursing facility for further evaluation of syncopal episode with loss of consciousness.  On presentation, she was hypotensive.  Lab work showed creatinine of 7, troponin of 105, lactic acid of 9.  Patient also reported diffuse abdominal pain.  Patient was admitted and was started on broad-spectrum antibiotics.  She was recently hospitalized on last December for sepsis secondary to left thigh abscess and underwent I&D with general surgery on 02/16/2024.  On this admission, she was found to have sepsis secondary to Klebsiella bacteremia and was on meropenem , she underwent multiple sessions of CRRT .  She was transferred to TRH service on 03/22/2024 but remained hypotensive, patient was also refusing care.  Found to have new progressive bilateral thigh erythema and swelling, concern for necrotizing fasciitis.  General surgery consulted.  Underwent excisional debridement of the left thigh skin, subcutaneous tissue.  Was briefly transferred to ICU for that on 1/25.  Patient transferred back to [facility] service on 1/26. Underwent placement of left internal jugular temporary dialysis catheter by IR today  Major Hospital events:  1/9 ED s/p witnessed syncopal event w/ +LOC in shock, started on Levo/Vaso; Aline/CVC placed 1/10.  CT abdomen pelvis in the ER.  Nonacute.   1/10 CRRT, still on vaso, NE. Esclated to meropenem  for ESBL Klebsiella 1/11 echocardiogram EF 60%.  No acute changes 1/12: off levophed , on vasopressin  1/13: off vasopressin , steroids, and CRRT 1/14: transfer out of ICU, TRH to pick up 1/15 1/23: Transfused units PRBC for hemoglobin of 6.7 1/23: TDC catheter continued by IR 1/25: Remains febrile, with rapidly  progressive bilateral groin/thigh/lower abdomen area erythema swelling concerning for necrotizing fasciitis 1/27: Underwent placement of left internal jugular temporary dialysis catheter by IR Right IJ central line removed 04/04/2024    Assessment & Plan:  Suspected necrotizing fasciitis/cellulitis of left lower extremity, history of abscess in the left thigh before:   Developed rapidly progressive lower extremity edema.  There was concern for necrotizing fasciitis.  General surgery consulted.  Underwent excisional debridement of the left thigh on 04/02/24.  She also had a history of left groin abscess on last admission and underwent I&D by Dr. Vernetta on 02/16/2024.  At that time CT abdomen/pelvis did not show any acute findings.  She was also seen by orthopedics, Dr. Harden on 1/15.  ID and CCS following currently on combination of linezolid , ertapenem , fluconazole    Septic shock secondary to Klebsiella pneumonia bacteremia: Source of infection was unclear but now appears to be left thigh neck Fash, she also had an indwelling HD catheter which was removed and a new catheter placed this admission, ID following kindly see above.  ESRD on dialysis: Nephrology following.  Dialyzed on TTS schedule.  IR placed new temporary dialysis catheter on 1/27  Chronic hypotension: Required vasopressor therapy, currently on midodrine .  Currently off vasopressors.  Blood pressure stable   Opioid use disorder/substance abuse/history of fibromyalgia: Follows outpatient with [facility] pain clinic.  Minimize narcotics, history of narcotic seeking behavior, use narcotics with caution as blood pressure tends to fall with high doses.  Patient has been counseled multiple times.  History of C. difficile: C. difficile negative.  On prophylactic vancomycin  because she is on antibiotics  Anemia of chronic disease: Likely secondary to ESRD.  Given blood transfusion here.  Currently hemoglobin is stable Syncope: Echo showed  EF of 60 to 65%, mild LVH, a small pericardial effusion posteriorly.  Troponin elevation/paroxysmal A-fib: Not on anticoagulant due to history of GI bleed.  Seen by cardiology for RVR placed on amiodarone  this admission, currently in sinus, echo done which showed preserved EF of 60% and no wall motion abnormality.  Stable from the standpoint.   Hyperlipidemia: On statin  History of duodenal carcinoid tumor s/p resection and lung monitor/gastric ulcer/gastritis/chronic gastroparesis: On H2 blocker,  History of peripheral artery disease: Status post right BKA.  Stump is well-healed  Possible vaginal yeast infection: On weekly Diflucan   Anxiety/depression: On BuSpar , risperidone , trazodone  at home  Diabetes type 2: On sliding scale now.  A1c 4.3 as per 02/15/2024.  Will DC insulin   Debility/deconditioning: Patient is from SNF.  Plan for discharge to SNF when appropriate.  Long length of stay    Pressure ulcers:  Had wound on the left thigh, right hip, lower abdomen, buttocks, left heel.  Wound care nurse following .Dr. Harden was consulted to evaluate left thigh postop site.   Nutrition Problem: Moderate Malnutrition Etiology: acute illness Wound 02/19/24 0200 Pressure Injury Foot Left;Lateral Unstageable - Full thickness tissue loss in which the base of the injury is covered by slough (yellow, tan, gray, green or brown) and/or eschar (tan, brown or black) in the wound bed. (Active)     Wound 03/17/24 0045 Pressure Injury Thigh Left;Posterior Stage 2 -  Partial thickness loss of dermis presenting as a shallow open injury with a red, pink wound bed without slough. (Active)     Wound 03/17/24 0045 Pressure Injury Hip Lateral;Right Stage 2 -  Partial thickness loss of dermis presenting as a shallow open injury with a red, pink wound bed without slough. (Active)     Wound 03/17/24 0045 Pressure Injury Abdomen Lower;Left Stage 2 -  Partial thickness loss of dermis presenting as a shallow open  injury with a red, pink wound bed without slough. (Active)     Wound 03/17/24 1119 Pressure Injury Buttocks Right;Left;Medial Stage 3 -  Full thickness tissue loss. Subcutaneous fat may be visible but bone, tendon or muscle are NOT exposed. (Active)    DVT prophylaxis:heparin  injection 5,000 Units Start: 04/02/24 1400 Place and maintain sequential compression device Start: 03/20/24 1800     Code Status: Full Code  Family Communication: None at the bedside  Patient status: Inpatient inpatient  Patient is from : SNF  Anticipated discharge to: SNF  Estimated DC date: Not sure.  Needs clearance from ID,nephrology   Consultants: Nephrology, critical care, general surgery, cardiology, palliative care  Procedures: Dialysis, I&D   Subjective:  Patient in bed, appears comfortable, denies any headache, no fever, no chest pain or pressure, no shortness of breath , no abdominal pain. No new focal weakness.    Objective: Vitals:   04/07/24 0438 04/07/24 0448 04/07/24 0454 04/07/24 0755  BP: (!) 105/58 112/62 105/69 (!) 100/56  Pulse: 78 73 75 82  Resp: 18 (!) 22 (!) 23 11  Temp:   97.6 F (36.4 C) 98.1 F (36.7 C)  TempSrc:    Oral  SpO2: 99% 96% 96% 98%  Weight:      Height:        Intake/Output Summary (Last 24 hours) at 04/07/2024 1032 Last data filed at 04/07/2024 0454 Gross per 24 hour  Intake --  Output 1900 ml  Net -1900 ml    [employer]  03/30/24 0848 03/30/24 1250 04/01/24 0500  Weight: (S) 105.4 kg (S) 104.2 kg 96.5 kg    Examination:  General exam: Overall comfortable, not in distress, chronically deconditioned, lying in hospital bed HEENT: PERRL, has left IJ central line  Respiratory system:  no wheezes or crackles, diminished sounds in bases Cardiovascular system: S1 & S2 heard, RRR.  Gastrointestinal system: Abdomen is nondistended, soft and nontender. Central nervous system: Alert and oriented Extremities: Dressing on the left thigh, right BKA,   ulcer on the left ankle and heel Skin: No rashes,no icterus      Data Review:   Patient Lines/Drains/Airways Status     Active Line/Drains/Airways     Name Placement date Placement time Site Days   Peripheral IV 03/20/24 20 G 2.5 Anterior;Proximal;Right;Upper Arm 03/20/24  1841  Arm  18   Fistula / Graft Left Upper arm Arteriovenous fistula 06/30/22  1200  Upper arm  647   Fistula / Graft Left Upper arm Arteriovenous fistula --  --  Upper arm  --   Hemodialysis Catheter Left Internal jugular Triple lumen Temporary (Non-Tunneled) 04/03/24  0951  Internal jugular  4   Wound 02/16/24 1900 Surgical Open Surgical Incision Groin Anterior;Left;Proximal 02/16/24  1900  Groin  51   Wound 02/19/24 0200 Pressure Injury Foot Left;Lateral Unstageable - Full thickness tissue loss in which the base of the injury is covered by slough (yellow, tan, gray, green or brown) and/or eschar (tan, brown or black) in the wound bed. 02/19/24  0200  Foot  48   Wound 03/17/24 0045 Pressure Injury Thigh Left;Posterior Stage 2 -  Partial thickness loss of dermis presenting as a shallow open injury with a red, pink wound bed without slough. 03/17/24  0045  Thigh  21   Wound 03/17/24 0045 Pressure Injury Hip Lateral;Right Stage 2 -  Partial thickness loss of dermis presenting as a shallow open injury with a red, pink wound bed without slough. 03/17/24  0045  Hip  21   Wound 03/17/24 0045 Pressure Injury Abdomen Lower;Left Stage 2 -  Partial thickness loss of dermis presenting as a shallow open injury with a red, pink wound bed without slough. 03/17/24  0045  Abdomen  21   Wound 03/17/24 1119 Pressure Injury Buttocks Right;Left;Medial Stage 3 -  Full thickness tissue loss. Subcutaneous fat may be visible but bone, tendon or muscle are NOT exposed. 03/17/24  1119  Buttocks  21   Wound 04/01/24 1500 Surgical Thigh Anterior;Distal;Left 04/01/24  1500  Thigh  6             Inpatient Medications  Scheduled Meds:   alteplase   2 mg Intracatheter Once   amiodarone   200 mg Oral Daily   ascorbic acid   500 mg Oral BID   atorvastatin   10 mg Oral Daily   busPIRone   5 mg Oral TID   darbepoetin (ARANESP ) injection - DIALYSIS  150 mcg Subcutaneous Q Wed-1800   famotidine   20 mg Oral QHS   fluconazole   200 mg Oral Weekly   heparin  injection (subcutaneous)  5,000 Units Subcutaneous Q8H   linezolid   600 mg Oral Q12H   midodrine   15 mg Oral Q8H   multivitamin  1 tablet Oral QHS   mouth rinse  15 mL Mouth Rinse 4 times per day   risperiDONE   1 mg Oral QHS   sodium chloride  flush  10-40 mL Intracatheter Q12H   vancomycin   125 mg Oral Daily   Continuous Infusions:  ertapenem  Stopped (  04/06/24 0600)   PRN Meds:.acetaminophen , acetaminophen , heparin  sodium (porcine), HYDROmorphone  (DILAUDID ) injection, hydrOXYzine , mouth rinse, polyethylene glycol, senna, traMADol   DVT Prophylaxis  heparin  injection 5,000 Units Start: 04/02/24 1400 Place and maintain sequential compression device Start: 03/20/24 1800   Recent Labs  Lab 04/01/24 1902 04/02/24 0548 04/03/24 1200 04/05/24 1316 04/07/24 0000  WBC 20.7* 19.0* 14.3* 15.8* 17.7*  HGB 8.9* 8.7* 8.6* 8.3* 8.7*  HCT 27.0* 24.9* 25.3* 25.6* 26.5*  PLT 259 251 237 238 267  MCV 96.1 92.6 94.8 97.7 97.4  MCH 31.7 32.3 32.2 31.7 32.0  MCHC 33.0 34.9 34.0 32.4 32.8  RDW 18.3* 18.4* 17.8* 18.0* 18.0*  LYMPHSABS 0.8  --   --   --   --   MONOABS 0.3  --   --   --   --   EOSABS 0.0  --   --   --   --   BASOSABS 0.1  --   --   --   --     Recent Labs  Lab 04/01/24 1901 04/01/24 1902 04/02/24 0548 04/03/24 1200 04/05/24 1316 04/07/24 0000  NA  --  140 139 139 138 138  K  --  3.7 3.8 3.1* 3.8 4.0  CL  --  102 100 101 103 101  CO2  --  24 23 23 24 22   ANIONGAP  --  14 16* 14 12 15   GLUCOSE  --  117* 99 145* 77 57*  BUN  --  23* 26* 32* 21* 28*  CREATININE  --  4.09* 4.19* 5.04* 4.39* 5.45*  AST  --  14*  --   --   --   --   ALT  --  6  --   --   --   --    ALKPHOS  --  61  --   --   --   --   BILITOT  --  0.9  --   --   --   --   ALBUMIN   --  3.4*  --   --  2.6*  --   LATICACIDVEN 0.7  --  0.9  --   --   --   MG  --  1.8 2.0  --   --  1.9  PHOS  --  3.8 4.3  --  2.2*  --   CALCIUM   --  7.9* 8.1* 8.2* 7.8* 7.6*      Recent Labs  Lab 04/01/24 1901 04/01/24 1902 04/02/24 0548 04/03/24 1200 04/05/24 1316 04/07/24 0000  LATICACIDVEN 0.7  --  0.9  --   --   --   MG  --  1.8 2.0  --   --  1.9  CALCIUM   --  7.9* 8.1* 8.2* 7.8* 7.6*    --------------------------------------------------------------------------------------------------------------- Lab Results  Component Value Date   CHOL 104 07/22/2019   HDL 30 (L) 07/22/2019   LDLCALC 37 07/22/2019   TRIG 185 (H) 07/22/2019   CHOLHDL 3.5 07/22/2019    Lab Results  Component Value Date   HGBA1C 4.3 (L) 02/15/2024   No results for input(s): TSH, T4TOTAL, FREET4, T3FREE, THYROIDAB in the last 72 hours. No results for input(s): VITAMINB12, FOLATE, FERRITIN, TIBC, IRON , RETICCTPCT in the last 72 hours. ------------------------------------------------------------------------------------------------------------------ Cardiac Enzymes No results for input(s): CKMB, TROPONINI, MYOGLOBIN in the last 168 hours.  Invalid input(s): CK  Micro Results No results found for this or any previous visit (from the past 240 hours).  Radiology Reports  No results  found.     Signature  -   Lavada Stank M.D on 04/07/2024 at 10:32 AM   -  To page go to [email or web address]      P1/31/2026, 10:32 AM  "

## 2024-04-07 NOTE — Progress Notes (Signed)
 " [facility] Progress Note   Subjective:   Seen in room, appears tired but awakens to voice and oriented to person, place, time. Denies SOB, CP, dizziness, nausea, leg pain. A little bit of an appetite   Objective Vitals:   04/07/24 0438 04/07/24 0448 04/07/24 0454 04/07/24 0755  BP: (!) 105/58 112/62 105/69 (!) 100/56  Pulse: 78 73 75 82  Resp: 18 (!) 22 (!) 23 11  Temp:   97.6 F (36.4 C) 98.1 F (36.7 C)  TempSrc:    Oral  SpO2: 99% 96% 96% 98%  Weight:      Height:       Physical Exam General: chronically ill appearing female in NAD Heart: RRR, no murmur Lungs: CTA bilaterally, respirations unlabored Abdomen: Soft, non-distended, +BS Extremities: 1-2+ edema bilateral lower extremities tender, rt LE amputation Dialysis Access: LIJ temp (1/27), lt BCF pulsatile and augments but very floppy skin  Additional Objective Labs: Basic Metabolic Panel: Recent Labs  Lab 04/01/24 1902 04/02/24 0548 04/03/24 1200 04/05/24 1316 04/07/24 0000  NA 140 139 139 138 138  K 3.7 3.8 3.1* 3.8 4.0  CL 102 100 101 103 101  CO2 24 23 23 24 22   GLUCOSE 117* 99 145* 77 57*  BUN 23* 26* 32* 21* 28*  CREATININE 4.09* 4.19* 5.04* 4.39* 5.45*  CALCIUM  7.9* 8.1* 8.2* 7.8* 7.6*  PHOS 3.8 4.3  --  2.2*  --    Liver Function Tests: Recent Labs  Lab 04/01/24 1902 04/05/24 1316  AST 14*  --   ALT 6  --   ALKPHOS 61  --   BILITOT 0.9  --   PROT 4.9*  --   ALBUMIN  3.4* 2.6*   Recent Labs  Lab 04/01/24 1902  LIPASE <10*  AMYLASE 20*   CBC: Recent Labs  Lab 04/01/24 1902 04/02/24 0548 04/03/24 1200 04/05/24 1316 04/07/24 0000  WBC 20.7* 19.0* 14.3* 15.8* 17.7*  NEUTROABS 19.4*  --   --   --   --   HGB 8.9* 8.7* 8.6* 8.3* 8.7*  HCT 27.0* 24.9* 25.3* 25.6* 26.5*  MCV 96.1 92.6 94.8 97.7 97.4  PLT 259 251 237 238 267   Blood Culture    Component Value Date/Time   SDES BLOOD RIGHT ANTECUBITAL 03/16/2024 1949   SPECREQUEST  03/16/2024 1949    BOTTLES DRAWN  AEROBIC AND ANAEROBIC Blood Culture results may not be optimal due to an inadequate volume of blood received in culture bottles   CULT (A) 03/16/2024 1949    KLEBSIELLA PNEUMONIAE Confirmed Extended Spectrum Beta-Lactamase Producer (ESBL).  In bloodstream infections from ESBL organisms, carbapenems are preferred over piperacillin /tazobactam. They are shown to have a lower risk of mortality.    REPTSTATUS 03/19/2024 FINAL 03/16/2024 1949    Cardiac Enzymes: No results for input(s): CKTOTAL, CKMB, CKMBINDEX, TROPONINI in the last 168 hours. CBG: Recent Labs  Lab 04/06/24 1700 04/06/24 1927 04/07/24 0405 04/07/24 0437 04/07/24 0755  GLUCAP 73 79 66* 89 88   Iron  Studies: No results for input(s): IRON , TIBC, TRANSFERRIN, FERRITIN in the last 72 hours. @lablastinr3 @ Studies/Results: No results found.  Medications:  ertapenem  Stopped (04/06/24 0600)    alteplase   2 mg Intracatheter Once   amiodarone   200 mg Oral Daily   amiodarone   200 mg Oral Daily   ascorbic acid   500 mg Oral BID   atorvastatin   10 mg Oral Daily   busPIRone   5 mg Oral TID   darbepoetin (ARANESP ) injection - DIALYSIS  150 mcg Subcutaneous Q Wed-1800   famotidine   20 mg Oral QHS   fluconazole   200 mg Oral Weekly   heparin  injection (subcutaneous)  5,000 Units Subcutaneous Q8H   linezolid   600 mg Oral Q12H   midodrine   15 mg Oral Q8H   multivitamin  1 tablet Oral QHS   mouth rinse  15 mL Mouth Rinse 4 times per day   risperiDONE   1 mg Oral QHS   sodium chloride  flush  10-40 mL Intracatheter Q12H   vancomycin   125 mg Oral Daily    [facility] TTS 2nd shift 4hr 180NRe 400/600 EDW 96.1kg  3/2.5 Mircera 225mcg q2wk Hectorol   Assessment/Plan: ESRD - Usual TTS schedule -> MWF for now bec of multiple refusals to go to HD. - s/p CRRT 1/10-1/13 given significant shock - Dialyzed Friday 1/23 prior to [facility] removal. No acute indications for HD at this time. Appreciate VIR placing LIJ temp cath on  1/27 and tolerated HD Tues with 2L net UF. Eventually will need TC (WBC 19k -> 14.3k). Lt BCF is not usable at this time (very floppy skin and will only lead to infiltration)  Has refused HD multiple times this week but fortunately got it overnight finishing early this am 1.9L  Will hold off today; plan next on Monday, then will keep her MWF until close to dc. WBC 17.7k (hold on TC)  PAfib - cardiology following, back on PO amiodarone    Hypotension/volume - Septic on admit,  S/p pressors - Now on high dose midodrine  20mg  TID + extra prn with HD.   Sepsis/ESBL Klebsiella bacteremia - On ertapenem , has been ordered for outpatient (clinic will notify hospital when it arrives) - Blood Cx 03/16/24 positive - issues getting repeat cultures as she has horrible peripheral veins - TDC is out -> currently with LIJ temp   Progressive dusky rash to pelvic/groin area, lower abd pannus and B legs  - seen by general surgery for worsening fever, hypotension -s/p debridement on 04/01/24 -on  antibiotics per primary team   Anemia of ESRD - Hgb 8.7 - S/p 2U PRBCs on 1/23 - Continue weekly Aranesp    Diarrhea - On empiric PO Vanc given Hx C.diff - Toxin negative on 1/10   T2DM - Insulin  per primary   Dispo/GOC - Palliative following.  - Plan is SNF      "

## 2024-04-08 DIAGNOSIS — R7881 Bacteremia: Secondary | ICD-10-CM | POA: Diagnosis not present

## 2024-04-08 LAB — GLUCOSE, CAPILLARY
Glucose-Capillary: 92 mg/dL (ref 70–99)
Glucose-Capillary: 100 mg/dL — ABNORMAL HIGH (ref 70–99)
Glucose-Capillary: 89 mg/dL (ref 70–99)
Glucose-Capillary: 84 mg/dL (ref 70–99)

## 2024-04-08 MED ORDER — HYDROMORPHONE HCL 1 MG/ML IJ SOLN
0.5000 mg | INTRAMUSCULAR | Status: DC | PRN
  Administered 2024-04-08: 0.5 mg via INTRAVENOUS
  Filled 2024-04-08: qty 0.5

## 2024-04-08 MED ORDER — ACETAMINOPHEN 325 MG PO TABS: 650.0000 mg | ORAL_TABLET | Freq: Four times a day (QID) | ORAL | Status: AC | PRN

## 2024-04-08 MED ORDER — LOPERAMIDE HCL 2 MG PO CAPS
2.0000 mg | ORAL_CAPSULE | Freq: Once | ORAL | Status: AC
  Administered 2024-04-08: 2 mg via ORAL
  Filled 2024-04-08: qty 1

## 2024-04-08 NOTE — Progress Notes (Signed)
 " PROGRESS NOTE  Deborah [name]  FMW:[id number] DOB: January 06, 1966 DOA: 03/16/2024 PCP: Pcp, No   Brief Narrative: Patient is a 59 year old female with past medical history of paroxysmal A-fib, hyperlipidemia, right BKA, ESRD on dialysis on TTS schedule who presented from skilled nursing facility for further evaluation of syncopal episode with loss of consciousness.  On presentation, she was hypotensive.  Lab work showed creatinine of 7, troponin of 105, lactic acid of 9.  Patient also reported diffuse abdominal pain.  Patient was admitted and was started on broad-spectrum antibiotics.  She was recently hospitalized on last December for sepsis secondary to left thigh abscess and underwent I&D with general surgery on 02/16/2024.  On this admission, she was found to have sepsis secondary to Klebsiella bacteremia and was on meropenem , she underwent multiple sessions of CRRT .  She was transferred to [facility] service on 03/22/2024 but remained hypotensive, patient was also refusing care.  Found to have new progressive bilateral thigh erythema and swelling, concern for necrotizing fasciitis.  General surgery consulted.  Underwent excisional debridement of the left thigh skin, subcutaneous tissue.  Was briefly transferred to ICU for that on 1/25.  Patient transferred back to [facility] service on 1/26. Underwent placement of left internal jugular temporary dialysis catheter by IR today  Major Hospital events:  1/9 ED s/p witnessed syncopal event w/ +LOC in shock, started on Levo/Vaso; Aline/CVC placed 1/10.  CT abdomen pelvis in the ER.  Nonacute.   1/10 CRRT, still on vaso, NE. Esclated to meropenem  for ESBL Klebsiella 1/11 echocardiogram EF 60%.  No acute changes 1/12: off levophed , on vasopressin  1/13: off vasopressin , steroids, and CRRT 1/14: transfer out of ICU, TRH to pick up 1/15 1/23: Transfused units PRBC for hemoglobin of 6.7 1/23: TDC catheter continued by IR 1/25: Remains febrile, with rapidly  progressive bilateral groin/thigh/lower abdomen area erythema swelling concerning for necrotizing fasciitis 1/27: Underwent placement of left internal jugular temporary dialysis catheter by IR Right IJ central line removed 04/04/2024   Subjective: Patient in bed denies any headache, no chest or abdominal pain, no shortness of breath.  No new focal weakness.   Assessment & Plan:  Suspected necrotizing fasciitis/cellulitis of left lower extremity, history of abscess in the left thigh before:   Developed rapidly progressive lower extremity edema.  There was concern for necrotizing fasciitis.  General surgery consulted.  Underwent excisional debridement of the left thigh on 04/02/24.  She also had a history of left groin abscess on last admission and underwent I&D by Dr. Vernetta on 02/16/2024.  At that time CT abdomen/pelvis did not show any acute findings.  She was also seen by orthopedics, Dr. Harden on 1/15.  ID and CCS following currently on combination of linezolid , ertapenem , fluconazole    Septic shock secondary to Klebsiella pneumonia bacteremia: Source of infection was unclear but now appears to be left thigh neck Fash, she also had an indwelling HD catheter which was removed and a new catheter placed this admission, ID following kindly see above.  ESRD on dialysis: Nephrology following.  Dialyzed on TTS schedule.  IR placed new temporary dialysis catheter on 1/27  Chronic hypotension: Required vasopressor therapy, currently on midodrine .  Currently off vasopressors.  Blood pressure stable   Opioid use disorder/substance abuse/history of fibromyalgia: Follows outpatient with [facility] pain clinic.  Minimize narcotics, history of narcotic seeking behavior, use narcotics with caution as blood pressure tends to fall with high doses.  Patient has been counseled multiple times.  History of C. difficile:  C. difficile negative.  On prophylactic vancomycin  because she is on antibiotics  Anemia of  chronic disease: Likely secondary to ESRD.  Given blood transfusion here.  Currently hemoglobin is stable Syncope: Echo showed EF of 60 to 65%, mild LVH, a small pericardial effusion posteriorly.  Troponin elevation/paroxysmal A-fib: Not on anticoagulant due to history of GI bleed.  Seen by cardiology for RVR placed on amiodarone  this admission, currently in sinus, echo done which showed preserved EF of 60% and no wall motion abnormality.  Stable from the standpoint.   Hyperlipidemia: On statin  History of duodenal carcinoid tumor s/p resection and lung monitor/gastric ulcer/gastritis/chronic gastroparesis: On H2 blocker,  History of peripheral artery disease: Status post right BKA.  Stump is well-healed  Possible vaginal yeast infection: On weekly Diflucan   Anxiety/depression: On BuSpar , risperidone , trazodone  at home  Diabetes type 2: On sliding scale now.  A1c 4.3 as per 02/15/2024.  Will DC insulin   Debility/deconditioning: Patient is from SNF.  Plan for discharge to SNF when appropriate.  Long length of stay    Pressure ulcers:  Had wound on the left thigh, right hip, lower abdomen, buttocks, left heel.  Wound care nurse following .Dr. Harden was consulted to evaluate left thigh postop site.   Nutrition Problem: Moderate Malnutrition Etiology: acute illness Wound 02/19/24 0200 Pressure Injury Foot Left;Lateral Unstageable - Full thickness tissue loss in which the base of the injury is covered by slough (yellow, tan, gray, green or brown) and/or eschar (tan, brown or black) in the wound bed. (Active)     Wound 03/17/24 0045 Pressure Injury Thigh Left;Posterior Stage 2 -  Partial thickness loss of dermis presenting as a shallow open injury with a red, pink wound bed without slough. (Active)     Wound 03/17/24 0045 Pressure Injury Hip Lateral;Right Stage 2 -  Partial thickness loss of dermis presenting as a shallow open injury with a red, pink wound bed without slough. (Active)      Wound 03/17/24 0045 Pressure Injury Abdomen Lower;Left Stage 2 -  Partial thickness loss of dermis presenting as a shallow open injury with a red, pink wound bed without slough. (Active)     Wound 03/17/24 1119 Pressure Injury Buttocks Right;Left;Medial Stage 3 -  Full thickness tissue loss. Subcutaneous fat may be visible but bone, tendon or muscle are NOT exposed. (Active)    DVT prophylaxis:heparin  injection 5,000 Units Start: 04/02/24 1400 Place and maintain sequential compression device Start: 03/20/24 1800     Code Status: Full Code  Family Communication: None at the bedside  Patient status: Inpatient inpatient  Patient is from : SNF  Anticipated discharge to: SNF  Estimated DC date: Not sure.  Needs clearance from ID,nephrology   Consultants: Nephrology, critical care, general surgery, cardiology, palliative care  Procedures: Dialysis, I&D  Objective: Vitals:   04/08/24 0000 04/08/24 0200 04/08/24 0400 04/08/24 0800  BP: (!) 100/54  (!) 108/59 (!) 98/52  Pulse: 74   78  Resp: 17 11 (!) 22   Temp: 98.4 F (36.9 C)   (!) 97.3 F (36.3 C)  TempSrc: Axillary   Oral  SpO2:    98%  Weight:      Height:       No intake or output data in the 24 hours ending 04/08/24 0847   Filed Weights   03/30/24 0848 03/30/24 1250 04/01/24 0500  Weight: (S) 105.4 kg (S) 104.2 kg 96.5 kg    Examination:  General exam: Overall comfortable, not in distress,  chronically deconditioned, lying in hospital bed HEENT: PERRL, has left IJ central line  Respiratory system:  no wheezes or crackles, diminished sounds in bases Cardiovascular system: S1 & S2 heard, RRR.  Gastrointestinal system: Abdomen is nondistended, soft and nontender. Central nervous system: Alert and oriented Extremities: Dressing on the left thigh, right BKA,  ulcer on the left ankle and heel Skin: No rashes,no icterus      Data Review:   Patient Lines/Drains/Airways Status     Active Line/Drains/Airways      Name Placement date Placement time Site Days   Peripheral IV 03/20/24 20 G 2.5 Anterior;Proximal;Right;Upper Arm 03/20/24  1841  Arm  19   Fistula / Graft Left Upper arm Arteriovenous fistula 06/30/22  1200  Upper arm  648   Fistula / Graft Left Upper arm Arteriovenous fistula --  --  Upper arm  --   Hemodialysis Catheter Left Internal jugular Triple lumen Temporary (Non-Tunneled) 04/03/24  0951  Internal jugular  5   Wound 02/16/24 1900 Surgical Open Surgical Incision Groin Anterior;Left;Proximal 02/16/24  1900  Groin  52   Wound 02/19/24 0200 Pressure Injury Foot Left;Lateral Unstageable - Full thickness tissue loss in which the base of the injury is covered by slough (yellow, tan, gray, green or brown) and/or eschar (tan, brown or black) in the wound bed. 02/19/24  0200  Foot  49   Wound 03/17/24 0045 Pressure Injury Thigh Left;Posterior Stage 2 -  Partial thickness loss of dermis presenting as a shallow open injury with a red, pink wound bed without slough. 03/17/24  0045  Thigh  22   Wound 03/17/24 0045 Pressure Injury Hip Lateral;Right Stage 2 -  Partial thickness loss of dermis presenting as a shallow open injury with a red, pink wound bed without slough. 03/17/24  0045  Hip  22   Wound 03/17/24 0045 Pressure Injury Abdomen Lower;Left Stage 2 -  Partial thickness loss of dermis presenting as a shallow open injury with a red, pink wound bed without slough. 03/17/24  0045  Abdomen  22   Wound 03/17/24 1119 Pressure Injury Buttocks Right;Left;Medial Stage 3 -  Full thickness tissue loss. Subcutaneous fat may be visible but bone, tendon or muscle are NOT exposed. 03/17/24  1119  Buttocks  22   Wound 04/01/24 1500 Surgical Thigh Anterior;Distal;Left 04/01/24  1500  Thigh  7             Inpatient Medications  Scheduled Meds:  alteplase   2 mg Intracatheter Once   amiodarone   200 mg Oral Daily   ascorbic acid   500 mg Oral BID   atorvastatin   10 mg Oral Daily   busPIRone   5 mg Oral TID    darbepoetin (ARANESP ) injection - DIALYSIS  150 mcg Subcutaneous Q Wed-1800   famotidine   20 mg Oral QHS   fluconazole   200 mg Oral Weekly   heparin  injection (subcutaneous)  5,000 Units Subcutaneous Q8H   linezolid   600 mg Oral Q12H   midodrine   15 mg Oral Q8H   multivitamin  1 tablet Oral QHS   mouth rinse  15 mL Mouth Rinse 4 times per day   risperiDONE   1 mg Oral QHS   sodium chloride  flush  10-40 mL Intracatheter Q12H   vancomycin   125 mg Oral Daily   Continuous Infusions:  ertapenem  500 mg (04/07/24 2252)   PRN Meds:.acetaminophen , heparin  sodium (porcine), HYDROmorphone  (DILAUDID ) injection, hydrOXYzine , mouth rinse, polyethylene glycol, senna, traMADol   DVT Prophylaxis  heparin  injection 5,000 Units  Start: 04/02/24 1400 Place and maintain sequential compression device Start: 03/20/24 1800   Recent Labs  Lab 04/01/24 1902 04/02/24 0548 04/03/24 1200 04/05/24 1316 04/07/24 0000 04/07/24 1504  WBC 20.7* 19.0* 14.3* 15.8* 17.7* 14.3*  HGB 8.9* 8.7* 8.6* 8.3* 8.7* 8.5*  HCT 27.0* 24.9* 25.3* 25.6* 26.5* 26.0*  PLT 259 251 237 238 267 232  MCV 96.1 92.6 94.8 97.7 97.4 97.4  MCH 31.7 32.3 32.2 31.7 32.0 31.8  MCHC 33.0 34.9 34.0 32.4 32.8 32.7  RDW 18.3* 18.4* 17.8* 18.0* 18.0* 17.8*  LYMPHSABS 0.8  --   --   --   --  2.1  MONOABS 0.3  --   --   --   --  0.9  EOSABS 0.0  --   --   --   --  0.8*  BASOSABS 0.1  --   --   --   --  0.1    Recent Labs  Lab 04/01/24 1401 04/01/24 1901 04/01/24 1902 04/02/24 0548 04/03/24 1200 04/05/24 1316 04/07/24 0000 04/07/24 1504  NA  --   --  140 139 139 138 138 138  K  --   --  3.7 3.8 3.1* 3.8 4.0 3.5  CL   < >  --  102 100 101 103 101 101  CO2   < >  --  24 23 23 24 22 26   ANIONGAP   < >  --  14 16* 14 12 15 11   GLUCOSE   < >  --  117* 99 145* 77 57* 83  BUN   < >  --  23* 26* 32* 21* 28* 14  CREATININE   < >  --  4.09* 4.19* 5.04* 4.39* 5.45* 3.31*  AST  --   --  14*  --   --   --   --   --   ALT  --   --  6  --    --   --   --   --   ALKPHOS  --   --  61  --   --   --   --   --   BILITOT  --   --  0.9  --   --   --   --   --   ALBUMIN   --   --  3.4*  --   --  2.6*  --  2.9*  LATICACIDVEN  --  0.7  --  0.9  --   --   --   --   MG  --   --  1.8 2.0  --   --  1.9  --   PHOS  --   --  3.8 4.3  --  2.2*  --  2.4*  CALCIUM    < >  --  7.9* 8.1* 8.2* 7.8* 7.6* 7.9*   < > = values in this interval not displayed.      Recent Labs  Lab 04/01/24 1901 04/01/24 1902 04/01/24 1902 04/02/24 0548 04/03/24 1200 04/05/24 1316 04/07/24 0000 04/07/24 1504  LATICACIDVEN 0.7  --   --  0.9  --   --   --   --   MG  --  1.8  --  2.0  --   --  1.9  --   CALCIUM   --  7.9*   < > 8.1* 8.2* 7.8* 7.6* 7.9*   < > = values in this interval not displayed.    --------------------------------------------------------------------------------------------------------------- Lab  Results  Component Value Date   CHOL 104 07/22/2019   HDL 30 (L) 07/22/2019   LDLCALC 37 07/22/2019   TRIG 185 (H) 07/22/2019   CHOLHDL 3.5 07/22/2019    Lab Results  Component Value Date   HGBA1C 4.3 (L) 02/15/2024   No results for input(s): TSH, T4TOTAL, FREET4, T3FREE, THYROIDAB in the last 72 hours. No results for input(s): VITAMINB12, FOLATE, FERRITIN, TIBC, IRON , RETICCTPCT in the last 72 hours. ------------------------------------------------------------------------------------------------------------------ Cardiac Enzymes No results for input(s): CKMB, TROPONINI, MYOGLOBIN in the last 168 hours.  Invalid input(s): CK  Micro Results No results found for this or any previous visit (from the past 240 hours).  Radiology Reports  No results found.     Signature  -   Deborah [name] M.D on 04/08/2024 at 8:47 AM   -  To page go to [email or web address]      P2/03/2024, 8:47 AM  "

## 2024-04-08 NOTE — Progress Notes (Signed)
 " [facility] Progress Note   Subjective:   Seen in room, appears tired but awakens to voice and oriented to person, place, time. Denies SOB, CP, dizziness, nausea, leg pain. A little bit of an appetite   Objective Vitals:   04/07/24 2200 04/08/24 0000 04/08/24 0200 04/08/24 0400  BP:  (!) 100/54  (!) 108/59  Pulse:  74    Resp: 16 17 11  (!) 22  Temp:  98.4 F (36.9 C)    TempSrc:  Axillary    SpO2:      Weight:      Height:       Physical Exam General: chronically ill appearing female in NAD Heart: RRR, no murmur Lungs: CTA bilaterally, respirations unlabored Abdomen: Soft, non-distended, +BS Extremities: 1-2+ edema bilateral lower extremities tender, rt LE amputation Dialysis Access: LIJ temp (1/27), lt BCF pulsatile and augments but very floppy skin  Additional Objective Labs: Basic Metabolic Panel: Recent Labs  Lab 04/02/24 0548 04/03/24 1200 04/05/24 1316 04/07/24 0000 04/07/24 1504  NA 139   < > 138 138 138  K 3.8   < > 3.8 4.0 3.5  CL 100   < > 103 101 101  CO2 23   < > 24 22 26   GLUCOSE 99   < > 77 57* 83  BUN 26*   < > 21* 28* 14  CREATININE 4.19*   < > 4.39* 5.45* 3.31*  CALCIUM  8.1*   < > 7.8* 7.6* 7.9*  PHOS 4.3  --  2.2*  --  2.4*   < > = values in this interval not displayed.   Liver Function Tests: Recent Labs  Lab 04/01/24 1902 04/05/24 1316 04/07/24 1504  AST 14*  --   --   ALT 6  --   --   ALKPHOS 61  --   --   BILITOT 0.9  --   --   PROT 4.9*  --   --   ALBUMIN  3.4* 2.6* 2.9*   Recent Labs  Lab 04/01/24 1902  LIPASE <10*  AMYLASE 20*   CBC: Recent Labs  Lab 04/01/24 1902 04/02/24 0548 04/03/24 1200 04/05/24 1316 04/07/24 0000 04/07/24 1504  WBC 20.7* 19.0* 14.3* 15.8* 17.7* 14.3*  NEUTROABS 19.4*  --   --   --   --  10.3*  HGB 8.9* 8.7* 8.6* 8.3* 8.7* 8.5*  HCT 27.0* 24.9* 25.3* 25.6* 26.5* 26.0*  MCV 96.1 92.6 94.8 97.7 97.4 97.4  PLT 259 251 237 238 267 232   Blood Culture    Component Value  Date/Time   SDES BLOOD RIGHT ANTECUBITAL 03/16/2024 1949   SPECREQUEST  03/16/2024 1949    BOTTLES DRAWN AEROBIC AND ANAEROBIC Blood Culture results may not be optimal due to an inadequate volume of blood received in culture bottles   CULT (A) 03/16/2024 1949    KLEBSIELLA PNEUMONIAE Confirmed Extended Spectrum Beta-Lactamase Producer (ESBL).  In bloodstream infections from ESBL organisms, carbapenems are preferred over piperacillin /tazobactam. They are shown to have a lower risk of mortality.    REPTSTATUS 03/19/2024 FINAL 03/16/2024 1949    Cardiac Enzymes: No results for input(s): CKTOTAL, CKMB, CKMBINDEX, TROPONINI in the last 168 hours. CBG: Recent Labs  Lab 04/07/24 0755 04/07/24 1216 04/07/24 1610 04/07/24 2122 04/08/24 0734  GLUCAP 88 78 99 105* 92   Iron  Studies: No results for input(s): IRON , TIBC, TRANSFERRIN, FERRITIN in the last 72 hours. @lablastinr3 @ Studies/Results: No results found.  Medications:  ertapenem  500 mg (04/07/24 2252)  alteplase   2 mg Intracatheter Once   amiodarone   200 mg Oral Daily   ascorbic acid   500 mg Oral BID   atorvastatin   10 mg Oral Daily   busPIRone   5 mg Oral TID   darbepoetin (ARANESP ) injection - DIALYSIS  150 mcg Subcutaneous Q Wed-1800   famotidine   20 mg Oral QHS   fluconazole   200 mg Oral Weekly   heparin  injection (subcutaneous)  5,000 Units Subcutaneous Q8H   linezolid   600 mg Oral Q12H   midodrine   15 mg Oral Q8H   multivitamin  1 tablet Oral QHS   mouth rinse  15 mL Mouth Rinse 4 times per day   risperiDONE   1 mg Oral QHS   sodium chloride  flush  10-40 mL Intracatheter Q12H   vancomycin   125 mg Oral Daily    [facility] TTS 2nd shift 4hr 180NRe 400/600 EDW 96.1kg  3/2.5 Mircera 225mcg q2wk Hectorol   Assessment/Plan: ESRD - Usual TTS schedule -> MWF for now bec of multiple refusals to go to HD. - s/p CRRT 1/10-1/13 given significant shock - Dialyzed Friday 1/23 prior to [facility] removal. No acute  indications for HD at this time. Appreciate VIR placing LIJ temp cath on 1/27 and tolerated HD Tues with 2L net UF. Eventually will need TC; maybe conversion later in the week. Lt BCF is not usable at this time (very floppy skin and will only lead to infiltration)  Has refused HD multiple times this week resulting in only 2 treatments this week but fortunately got it Fri evening with 1.9L  Plan next HDon Monday, then will keep her MWF until close to dc. WBC 17.7k (hold on TC) -> 14.3k.   PAfib - cardiology following, back on PO amiodarone    Hypotension/volume - Septic on admit,  S/p pressors - Now on high dose midodrine  20mg  TID + extra prn with HD.   Sepsis/ESBL Klebsiella bacteremia - On ertapenem , has been ordered for outpatient (clinic will notify hospital when it arrives) - Blood Cx 03/16/24 positive - issues getting repeat cultures as she has horrible peripheral veins - TDC is out -> currently with LIJ temp   Progressive dusky rash to pelvic/groin area, lower abd pannus and B legs  - seen by general surgery for worsening fever, hypotension -s/p debridement on 04/01/24 -on  antibiotics per primary team   Anemia of ESRD - Hgb 8.7 - S/p 2U PRBCs on 1/23 - Continue weekly Aranesp    Diarrhea - On empiric PO Vanc given Hx C.diff - Toxin negative on 1/10   T2DM - Insulin  per primary   Dispo/GOC - Palliative following.  - Plan is SNF      "

## 2024-04-08 NOTE — Plan of Care (Signed)
" °  Problem: Education: Goal: Knowledge of General Education information will improve Description: Including pain rating scale, medication(s)/side effects and non-pharmacologic comfort measures Outcome: Progressing   Problem: Health Behavior/Discharge Planning: Goal: Ability to manage health-related needs will improve Outcome: Progressing   Problem: Clinical Measurements: Goal: Ability to maintain clinical measurements within normal limits will improve Outcome: Progressing Goal: Will remain free from infection Outcome: Progressing Goal: Diagnostic test results will improve Outcome: Progressing Goal: Respiratory complications will improve Outcome: Progressing Goal: Cardiovascular complication will be avoided Outcome: Progressing   Problem: Activity: Goal: Risk for activity intolerance will decrease Outcome: Progressing   Problem: Nutrition: Goal: Adequate nutrition will be maintained Outcome: Progressing   Problem: Coping: Goal: Level of anxiety will decrease Outcome: Progressing   Problem: Elimination: Goal: Will not experience complications related to bowel motility Outcome: Progressing Goal: Will not experience complications related to urinary retention Outcome: Progressing   Problem: Pain Managment: Goal: General experience of comfort will improve and/or be controlled Outcome: Progressing   Problem: Safety: Goal: Ability to remain free from injury will improve Outcome: Progressing   Problem: Skin Integrity: Goal: Risk for impaired skin integrity will decrease Outcome: Progressing   Problem: Education: Goal: Ability to describe self-care measures that may prevent or decrease complications (Diabetes Survival Skills Education) will improve Outcome: Progressing Goal: Individualized Educational Video(s) Outcome: Progressing   Problem: Coping: Goal: Ability to adjust to condition or change in health will improve Outcome: Progressing   Problem: Fluid  Volume: Goal: Ability to maintain a balanced intake and output will improve Outcome: Progressing   Problem: Health Behavior/Discharge Planning: Goal: Ability to identify and utilize available resources and services will improve Outcome: Progressing Goal: Ability to manage health-related needs will improve Outcome: Progressing   Problem: Metabolic: Goal: Ability to maintain appropriate glucose levels will improve Outcome: Progressing   Problem: Nutritional: Goal: Maintenance of adequate nutrition will improve Outcome: Progressing Goal: Progress toward achieving an optimal weight will improve Outcome: Progressing   Problem: Skin Integrity: Goal: Risk for impaired skin integrity will decrease Outcome: Progressing   Problem: Tissue Perfusion: Goal: Adequacy of tissue perfusion will improve Outcome: Progressing   Problem: Education: Goal: Knowledge of the prescribed therapeutic regimen will improve Outcome: Progressing   Problem: Bowel/Gastric: Goal: Gastrointestinal status for postoperative course will improve Outcome: Progressing   Problem: Cardiac: Goal: Ability to maintain an adequate cardiac output Outcome: Progressing Goal: Will show no evidence of cardiac arrhythmias Outcome: Progressing   Problem: Nutritional: Goal: Will attain and maintain optimal nutritional status Outcome: Progressing   Problem: Neurological: Goal: Will regain or maintain usual level of consciousness Outcome: Progressing   Problem: Clinical Measurements: Goal: Ability to maintain clinical measurements within normal limits Outcome: Progressing Goal: Postoperative complications will be avoided or minimized Outcome: Progressing   Problem: Respiratory: Goal: Will regain and/or maintain adequate ventilation Outcome: Progressing Goal: Respiratory status will improve Outcome: Progressing   Problem: Skin Integrity: Goal: Demonstrates signs of wound healing without infection Outcome:  Progressing   "

## 2024-04-08 NOTE — Progress Notes (Signed)
 PT REFUSING TO BE CHANGED REPORTS THAT SHE IS STILL GOING. UNABLE TO CHANGE HER.

## 2024-04-09 ENCOUNTER — Inpatient Hospital Stay (HOSPITAL_COMMUNITY)

## 2024-04-09 DIAGNOSIS — R7881 Bacteremia: Secondary | ICD-10-CM | POA: Diagnosis not present

## 2024-04-09 LAB — GLUCOSE, CAPILLARY
Glucose-Capillary: 48 mg/dL — ABNORMAL LOW (ref 70–99)
Glucose-Capillary: 49 mg/dL — ABNORMAL LOW (ref 70–99)
Glucose-Capillary: 54 mg/dL — ABNORMAL LOW (ref 70–99)
Glucose-Capillary: 94 mg/dL (ref 70–99)
Glucose-Capillary: 83 mg/dL (ref 70–99)
Glucose-Capillary: 69 mg/dL — ABNORMAL LOW (ref 70–99)
Glucose-Capillary: 87 mg/dL (ref 70–99)

## 2024-04-09 MED ORDER — DIPHENHYDRAMINE HCL 25 MG PO CAPS
50.0000 mg | ORAL_CAPSULE | Freq: Once | ORAL | Status: DC
  Filled 2024-04-09: qty 2

## 2024-04-09 MED ORDER — DIPHENHYDRAMINE HCL 50 MG/ML IJ SOLN: 50.0000 mg | Freq: Once | INTRAMUSCULAR | Status: DC

## 2024-04-09 MED ORDER — HYDROMORPHONE HCL 1 MG/ML IJ SOLN
0.2000 mg | Freq: Once | INTRAMUSCULAR | Status: AC
  Administered 2024-04-09: 0.2 mg via INTRAVENOUS
  Filled 2024-04-09: qty 0.5

## 2024-04-09 MED ORDER — METHYLPREDNISOLONE SODIUM SUCC 40 MG IJ SOLR: 40.0000 mg | Freq: Once | INTRAMUSCULAR | Status: DC

## 2024-04-09 MED ORDER — PREDNISONE 5 MG PO TABS
50.0000 mg | ORAL_TABLET | Freq: Four times a day (QID) | ORAL | Status: DC
  Filled 2024-04-09: qty 2

## 2024-04-09 MED ORDER — LOPERAMIDE HCL 2 MG PO CAPS
2.0000 mg | ORAL_CAPSULE | Freq: Once | ORAL | Status: AC
  Administered 2024-04-09: 2 mg via ORAL
  Filled 2024-04-09: qty 1

## 2024-04-09 MED ORDER — HYDROMORPHONE HCL 1 MG/ML IJ SOLN
0.5000 mg | Freq: Three times a day (TID) | INTRAMUSCULAR | Status: AC | PRN
  Administered 2024-04-09 – 2024-04-13 (×7): 0.5 mg via INTRAVENOUS
  Filled 2024-04-09 (×8): qty 0.5

## 2024-04-09 MED ORDER — DEXTROSE 50 % IV SOLN
1.0000 | INTRAVENOUS | Status: AC | PRN
  Administered 2024-04-09: 50 mL via INTRAVENOUS

## 2024-04-09 MED ORDER — DEXTROSE 50 % IV SOLN
INTRAVENOUS | Status: AC
  Filled 2024-04-09: qty 50

## 2024-04-09 MED ORDER — GERHARDT'S BUTT CREAM
TOPICAL_CREAM | Freq: Two times a day (BID) | CUTANEOUS | Status: AC
  Filled 2024-04-09: qty 60

## 2024-04-09 MED ORDER — DIPHENHYDRAMINE HCL 25 MG PO CAPS: 50.0000 mg | ORAL_CAPSULE | Freq: Once | ORAL | Status: DC

## 2024-04-09 MED ORDER — DEXTROSE 50 % IV SOLN: 50.0000 mL | Freq: Once | INTRAVENOUS | Status: AC

## 2024-04-09 NOTE — Plan of Care (Signed)
" °  Problem: Clinical Measurements: Goal: Ability to maintain clinical measurements within normal limits will improve Outcome: Progressing Goal: Will remain free from infection Outcome: Progressing   Problem: Activity: Goal: Risk for activity intolerance will decrease Outcome: Progressing   Problem: Nutrition: Goal: Adequate nutrition will be maintained Outcome: Progressing   Problem: Coping: Goal: Level of anxiety will decrease Outcome: Progressing   Problem: Elimination: Goal: Will not experience complications related to bowel motility Outcome: Progressing   Problem: Pain Managment: Goal: General experience of comfort will improve and/or be controlled Outcome: Progressing   Problem: Safety: Goal: Ability to remain free from injury will improve Outcome: Progressing   Problem: Skin Integrity: Goal: Risk for impaired skin integrity will decrease Outcome: Progressing   "

## 2024-04-09 NOTE — Progress Notes (Signed)
 " PROGRESS NOTE  Deborah [name]  FMW:[id number] DOB: 1966-02-01 DOA: 03/16/2024 PCP: Pcp, No   Brief Narrative: Patient is a 59 year old female with past medical history of paroxysmal A-fib, hyperlipidemia, right BKA, ESRD on dialysis on TTS schedule who presented from skilled nursing facility for further evaluation of syncopal episode with loss of consciousness.  On presentation, she was hypotensive.  Lab work showed creatinine of 7, troponin of 105, lactic acid of 9.  Patient also reported diffuse abdominal pain.  Patient was admitted and was started on broad-spectrum antibiotics.  She was recently hospitalized on last December for sepsis secondary to left thigh abscess and underwent I&D with general surgery on 02/16/2024.  On this admission, she was found to have sepsis secondary to Klebsiella bacteremia and was on meropenem , she underwent multiple sessions of CRRT .  She was transferred to [facility] service on 03/22/2024 but remained hypotensive, patient was also refusing care.  Found to have new progressive bilateral thigh erythema and swelling, concern for necrotizing fasciitis.  General surgery consulted.  Underwent excisional debridement of the left thigh skin, subcutaneous tissue.  Was briefly transferred to ICU for that on 1/25.  Patient transferred back to [facility] service on 1/26. Underwent placement of left internal jugular temporary dialysis catheter by IR today  Major Hospital events:  1/9 ED s/p witnessed syncopal event w/ +LOC in shock, started on Levo/Vaso; Aline/CVC placed 1/10.  CT abdomen pelvis in the ER.  Nonacute.   1/10 CRRT, still on vaso, NE. Esclated to meropenem  for ESBL Klebsiella 1/11 echocardiogram EF 60%.  No acute changes 1/12: off levophed , on vasopressin  1/13: off vasopressin , steroids, and CRRT 1/14: transfer out of ICU, TR[facility] to pick up 1/15 1/23: Transfused units PRBC for hemoglobin of 6.7 1/23: TDC catheter continued by IR 1/25: Remains febrile, with rapidly  progressive bilateral groin/thigh/lower abdomen area erythema swelling concerning for necrotizing fasciitis 1/27: Underwent placement of left internal jugular temporary dialysis catheter by IR Right IJ central line removed 04/04/2024   Subjective: Patient in bed, appears comfortable, denies any headache, no fever, no chest pain or pressure, no shortness of breath , no abdominal pain. No focal weakness.   Assessment & Plan:  Suspected necrotizing fasciitis/cellulitis of left lower extremity, history of abscess in the left thigh before:   Developed rapidly progressive lower extremity edema.  There was concern for necrotizing fasciitis.  General surgery consulted.  Underwent excisional debridement of the left thigh on 04/02/24.  She also had a history of left groin abscess on last admission and underwent I&D by Dr. Vernetta on 02/16/2024.  At that time CT abdomen/pelvis did not show any acute findings.  She was also seen by orthopedics, Dr. Harden on 03/22/2024.  Repeating CT on 04/09/2024.  ID and CCS following currently on combination of linezolid , ertapenem , fluconazole    Septic shock secondary to Klebsiella pneumonia bacteremia: Source of infection was unclear but now appears to be left thigh neck Fash, she also had an indwelling HD catheter which was removed and a new catheter placed this admission, ID following kindly see above.  ESRD on dialysis: Nephrology following.  Dialyzed on TTS schedule.  IR placed new temporary dialysis catheter on 1/27  Chronic hypotension: Required vasopressor therapy, currently on midodrine .  Currently off vasopressors.  Blood pressure stable   Opioid use disorder/substance abuse/history of fibromyalgia: Follows outpatient with [facility] pain clinic.  Minimize narcotics, history of narcotic seeking behavior, use narcotics with caution as blood pressure tends to fall with high doses.  Patient has been counseled multiple times.  History of C. difficile: C. difficile  negative.  On prophylactic vancomycin  because she is on antibiotics  Anemia of chronic disease: Likely secondary to ESRD.  Given blood transfusion here.  Currently hemoglobin is stable Syncope: Echo showed EF of 60 to 65%, mild LVH, a small pericardial effusion posteriorly.  Troponin elevation/paroxysmal A-fib: Not on anticoagulant due to history of GI bleed.  Seen by cardiology for RVR placed on amiodarone  this admission, currently in sinus, echo done which showed preserved EF of 60% and no wall motion abnormality.  Stable from the standpoint.   Hyperlipidemia: On statin  History of duodenal carcinoid tumor s/p resection and lung monitor/gastric ulcer/gastritis/chronic gastroparesis: On H2 blocker,  History of peripheral artery disease: Status post right BKA.  Stump is well-healed  Possible vaginal yeast infection: On weekly Diflucan   Anxiety/depression: On BuSpar , risperidone , trazodone  at home  Diabetes type 2: On sliding scale now.  A1c 4.3 as per 02/15/2024.  Will DC insulin   Debility/deconditioning: Patient is from SNF.  Plan for discharge to SNF when appropriate.  Long length of stay    Pressure ulcers:  Had wound on the left thigh, right hip, lower abdomen, buttocks, left heel.  Wound care nurse following .Dr. Harden was consulted to evaluate left thigh postop site.   Nutrition Problem: Moderate Malnutrition Etiology: acute illness Wound 02/19/24 0200 Pressure Injury Foot Left;Lateral Unstageable - Full thickness tissue loss in which the base of the injury is covered by slough (yellow, tan, gray, green or brown) and/or eschar (tan, brown or black) in the wound bed. (Active)     Wound 03/17/24 0045 Pressure Injury Thigh Left;Posterior Stage 2 -  Partial thickness loss of dermis presenting as a shallow open injury with a red, pink wound bed without slough. (Active)     Wound 03/17/24 0045 Pressure Injury Hip Lateral;Right Stage 2 -  Partial thickness loss of dermis presenting as  a shallow open injury with a red, pink wound bed without slough. (Active)     Wound 03/17/24 0045 Pressure Injury Abdomen Lower;Left Stage 2 -  Partial thickness loss of dermis presenting as a shallow open injury with a red, pink wound bed without slough. (Active)     Wound 03/17/24 1119 Pressure Injury Buttocks Right;Left;Medial Stage 3 -  Full thickness tissue loss. Subcutaneous fat may be visible but bone, tendon or muscle are NOT exposed. (Active)    DVT prophylaxis:heparin  injection 5,000 Units Start: 04/02/24 1400 Place and maintain sequential compression device Start: 03/20/24 1800     Code Status: Full Code  Family Communication: None at the bedside  Patient status: Inpatient inpatient  Patient is from : SNF  Anticipated discharge to: SNF  Estimated DC date: Not sure.  Needs clearance from ID,nephrology   Consultants: Nephrology, critical care, general surgery, cardiology, palliative care  Procedures: Dialysis, I&D  Objective: Vitals:   04/08/24 1936 04/08/24 2300 04/09/24 0505 04/09/24 0808  BP: 119/65 (!) 91/53 (!) 90/58 (!) 97/52  Pulse: 79 87  85  Resp: 16 16 16    Temp: (!) 97.3 F (36.3 C) 97.6 F (36.4 C) 98.2 F (36.8 C) 98.5 F (36.9 C)  TempSrc: Oral Oral Oral Oral  SpO2:  100%    Weight:      Height:       No intake or output data in the 24 hours ending 04/09/24 1014   Filed Weights   03/30/24 0848 03/30/24 1250 04/01/24 0500  Weight: (S) 105.4 kg (S)  104.2 kg 96.5 kg    Examination:  General exam: Overall comfortable, not in distress, chronically deconditioned, lying in hospital bed HEENT: PERRL, has left IJ central line  Respiratory system:  no wheezes or crackles, diminished sounds in bases Cardiovascular system: S1 & S2 heard, RRR.  Gastrointestinal system: Abdomen is nondistended, soft and nontender. Central nervous system: Alert and oriented Extremities: Dressing on the left thigh, right BKA,  ulcer on the left ankle and heel Skin:  No rashes,no icterus      Data Review:   Patient Lines/Drains/Airways Status     Active Line/Drains/Airways     Name Placement date Placement time Site Days   Peripheral IV 03/20/24 20 G 2.5 Anterior;Proximal;Right;Upper Arm 03/20/24  1841  Arm  20   Fistula / Graft Left Upper arm Arteriovenous fistula 06/30/22  1200  Upper arm  649   Fistula / Graft Left Upper arm Arteriovenous fistula --  --  Upper arm  --   Hemodialysis Catheter Left Internal jugular Triple lumen Temporary (Non-Tunneled) 04/03/24  0951  Internal jugular  6   Wound 02/16/24 1900 Surgical Open Surgical Incision Groin Anterior;Left;Proximal 02/16/24  1900  Groin  53   Wound 02/19/24 0200 Pressure Injury Foot Left;Lateral Unstageable - Full thickness tissue loss in which the base of the injury is covered by slough (yellow, tan, gray, green or brown) and/or eschar (tan, brown or black) in the wound bed. 02/19/24  0200  Foot  50   Wound 03/17/24 0045 Pressure Injury Thigh Left;Posterior Stage 2 -  Partial thickness loss of dermis presenting as a shallow open injury with a red, pink wound bed without slough. 03/17/24  0045  Thigh  23   Wound 03/17/24 0045 Pressure Injury Hip Lateral;Right Stage 2 -  Partial thickness loss of dermis presenting as a shallow open injury with a red, pink wound bed without slough. 03/17/24  0045  Hip  23   Wound 03/17/24 0045 Pressure Injury Abdomen Lower;Left Stage 2 -  Partial thickness loss of dermis presenting as a shallow open injury with a red, pink wound bed without slough. 03/17/24  0045  Abdomen  23   Wound 03/17/24 1119 Pressure Injury Buttocks Right;Left;Medial Stage 3 -  Full thickness tissue loss. Subcutaneous fat may be visible but bone, tendon or muscle are NOT exposed. 03/17/24  1119  Buttocks  23   Wound 04/01/24 1500 Surgical Thigh Anterior;Distal;Left 04/01/24  1500  Thigh  8             Inpatient Medications  Scheduled Meds:  amiodarone   200 mg Oral Daily   ascorbic acid    500 mg Oral BID   atorvastatin   10 mg Oral Daily   busPIRone   5 mg Oral TID   darbepoetin (ARANESP ) injection - DIALYSIS  150 mcg Subcutaneous Q Wed-1800   famotidine   20 mg Oral QHS   fluconazole   200 mg Oral Weekly   Gerhardt's butt cream   Topical BID   heparin  injection (subcutaneous)  5,000 Units Subcutaneous Q8H   linezolid   600 mg Oral Q12H   midodrine   15 mg Oral Q8H   multivitamin  1 tablet Oral QHS   mouth rinse  15 mL Mouth Rinse 4 times per day   risperiDONE   1 mg Oral QHS   sodium chloride  flush  10-40 mL Intracatheter Q12H   vancomycin   125 mg Oral Daily   Continuous Infusions:  ertapenem  Stopped (04/08/24 2230)   PRN Meds:.acetaminophen , heparin  sodium (porcine), [START ON  04/10/2024]  HYDROmorphone  (DILAUDID ) injection, hydrOXYzine , mouth rinse, polyethylene glycol, senna, traMADol   DVT Prophylaxis  heparin  injection 5,000 Units Start: 04/02/24 1400 Place and maintain sequential compression device Start: 03/20/24 1800   Recent Labs  Lab 04/03/24 1200 04/05/24 1316 04/07/24 0000 04/07/24 1504  WBC 14.3* 15.8* 17.7* 14.3*  HGB 8.6* 8.3* 8.7* 8.5*  HCT 25.3* 25.6* 26.5* 26.0*  PLT 237 238 267 232  MCV 94.8 97.7 97.4 97.4  MCH 32.2 31.7 32.0 31.8  MCHC 34.0 32.4 32.8 32.7  RDW 17.8* 18.0* 18.0* 17.8*  LYMPHSABS  --   --   --  2.1  MONOABS  --   --   --  0.9  EOSABS  --   --   --  0.8*  BASOSABS  --   --   --  0.1    Recent Labs  Lab 04/03/24 1200 04/05/24 1316 04/07/24 0000 04/07/24 1504  NA 139 138 138 138  K 3.1* 3.8 4.0 3.5  CL 101 103 101 101  CO2 23 24 22 26   ANIONGAP 14 12 15 11   GLUCOSE 145* 77 57* 83  BUN 32* 21* 28* 14  CREATININE 5.04* 4.39* 5.45* 3.31*  ALBUMIN   --  2.6*  --  2.9*  MG  --   --  1.9  --   PHOS  --  2.2*  --  2.4*  CALCIUM  8.2* 7.8* 7.6* 7.9*      Recent Labs  Lab 04/03/24 1200 04/05/24 1316 04/07/24 0000 04/07/24 1504  MG  --   --  1.9  --   CALCIUM  8.2* 7.8* 7.6* 7.9*     --------------------------------------------------------------------------------------------------------------- Lab Results  Component Value Date   CHOL 104 07/22/2019   HDL 30 (L) 07/22/2019   LDLCALC 37 07/22/2019   TRIG 185 (H) 07/22/2019   CHOLHDL 3.5 07/22/2019    Lab Results  Component Value Date   HGBA1C 4.3 (L) 02/15/2024   No results for input(s): TSH, T4TOTAL, FREET4, T3FREE, THYROIDAB in the last 72 hours. No results for input(s): VITAMINB12, FOLATE, FERRITIN, TIBC, IRON , RETICCTPCT in the last 72 hours. ------------------------------------------------------------------------------------------------------------------ Cardiac Enzymes No results for input(s): CKMB, TROPONINI, MYOGLOBIN in the last 168 hours.  Invalid input(s): CK  Micro Results No results found for this or any previous visit (from the past 240 hours).  Radiology Reports  DG Chest Port 1 View Result Date: 04/09/2024 CLINICAL DATA:  [name]. EXAM: PORTABLE CHEST 1 VIEW COMPARISON:  04/01/2024 and CT chest 04/25/2023. FINDINGS: Trachea is midline. Heart is enlarged, stable. Left IJ dialysis catheter tips are in the SVC. Minimal streaky densities in the lung bases. No airspace consolidation or pleural fluid. IMPRESSION: Minimal streaky volume loss in the lung bases. Electronically Signed   By: Newell Eke M.D.   On: 04/09/2024 09:57       Signature  -   Lavada Stank M.D on 04/09/2024 at 10:14 AM   -  To page go to [email or web address]      P2/04/2024, 10:14 AM  "

## 2024-04-10 LAB — COMPREHENSIVE METABOLIC PANEL WITH GFR
ALT: <5 U/L (ref 0–44)
AST: 15 U/L (ref 15–41)
Albumin: 2.7 g/dL — ABNORMAL LOW (ref 3.5–5.0)
Alkaline Phosphatase: 100 U/L (ref 38–126)
Anion gap: 14 (ref 5–15)
BUN: 23 mg/dL — ABNORMAL HIGH (ref 6–20)
CO2: 23 mmol/L (ref 22–32)
Calcium: 8.4 mg/dL — ABNORMAL LOW (ref 8.9–10.3)
Chloride: 106 mmol/L (ref 98–111)
Creatinine, Ser: 5.66 mg/dL — ABNORMAL HIGH (ref 0.44–1.00)
GFR, Estimated: 8 mL/min — ABNORMAL LOW
Glucose, Bld: 85 mg/dL (ref 70–99)
Potassium: 3.9 mmol/L (ref 3.5–5.1)
Sodium: 143 mmol/L (ref 135–145)
Total Bilirubin: 0.3 mg/dL (ref 0.0–1.2)
Total Protein: 4.8 g/dL — ABNORMAL LOW (ref 6.5–8.1)

## 2024-04-10 LAB — CBC WITH DIFFERENTIAL/PLATELET
Abs Immature Granulocytes: 0.12 10*3/uL — ABNORMAL HIGH (ref 0.00–0.07)
Basophils Absolute: 0.1 10*3/uL (ref 0.0–0.1)
Basophils Relative: 1 %
Eosinophils Absolute: 0.3 10*3/uL (ref 0.0–0.5)
Eosinophils Relative: 2 %
HCT: 27.7 % — ABNORMAL LOW (ref 36.0–46.0)
Hemoglobin: 9.2 g/dL — ABNORMAL LOW (ref 12.0–15.0)
Immature Granulocytes: 1 %
Lymphocytes Relative: 16 %
Lymphs Abs: 2.7 10*3/uL (ref 0.7–4.0)
MCH: 32.7 pg (ref 26.0–34.0)
MCHC: 33.2 g/dL (ref 30.0–36.0)
MCV: 98.6 fL (ref 80.0–100.0)
Monocytes Absolute: 1.1 10*3/uL — ABNORMAL HIGH (ref 0.1–1.0)
Monocytes Relative: 7 %
Neutro Abs: 12.1 10*3/uL — ABNORMAL HIGH (ref 1.7–7.7)
Neutrophils Relative %: 73 %
Platelets: 264 10*3/uL (ref 150–400)
RBC: 2.81 MIL/uL — ABNORMAL LOW (ref 3.87–5.11)
RDW: 18.2 % — ABNORMAL HIGH (ref 11.5–15.5)
WBC: 16.4 10*3/uL — ABNORMAL HIGH (ref 4.0–10.5)
nRBC: 0 % (ref 0.0–0.2)

## 2024-04-10 LAB — TSH: TSH: 2.39 u[IU]/mL (ref 0.350–4.500)

## 2024-04-10 LAB — CORTISOL: Cortisol, Plasma: 7.1 ug/dL

## 2024-04-10 LAB — PROCALCITONIN: Procalcitonin: 3.16 ng/mL

## 2024-04-10 LAB — GLUCOSE, CAPILLARY
Glucose-Capillary: 88 mg/dL (ref 70–99)
Glucose-Capillary: 77 mg/dL (ref 70–99)
Glucose-Capillary: 89 mg/dL (ref 70–99)

## 2024-04-10 LAB — HEMOGLOBIN A1C
Hgb A1c MFr Bld: 5 % (ref 4.8–5.6)
Mean Plasma Glucose: 96.8 mg/dL

## 2024-04-10 LAB — C-REACTIVE PROTEIN: CRP: 6.9 mg/dL — ABNORMAL HIGH

## 2024-04-10 LAB — T4, FREE: Free T4: 1.26 ng/dL (ref 0.80–2.00)

## 2024-04-10 MED ORDER — LOPERAMIDE HCL 2 MG PO CAPS
4.0000 mg | ORAL_CAPSULE | Freq: Once | ORAL | Status: AC
  Administered 2024-04-10: 4 mg via ORAL
  Filled 2024-04-10: qty 2

## 2024-04-10 MED ORDER — ALTEPLASE 2 MG IJ SOLR
2.0000 mg | Freq: Once | INTRAMUSCULAR | Status: AC
  Administered 2024-04-10: 2 mg
  Filled 2024-04-10: qty 2

## 2024-04-10 MED ORDER — HEPARIN SODIUM (PORCINE) 1000 UNIT/ML IJ SOLN
INTRAMUSCULAR | Status: AC
  Filled 2024-04-10: qty 3

## 2024-04-10 MED ORDER — HEPARIN SODIUM (PORCINE) 1000 UNIT/ML IJ SOLN
2800.0000 [IU] | Freq: Once | INTRAMUSCULAR | Status: AC
  Administered 2024-04-10: 2800 [IU]

## 2024-04-10 MED ORDER — DEXAMETHASONE SOD PHOSPHATE PF 10 MG/ML IJ SOLN: 6.0000 mg | INTRAMUSCULAR | Status: DC

## 2024-04-10 MED ORDER — SODIUM CHLORIDE 0.9% FLUSH: 10.0000 mL | INTRAVENOUS | Status: AC | PRN

## 2024-04-10 MED ORDER — LOPERAMIDE HCL 2 MG PO CAPS: 4.0000 mg | ORAL_CAPSULE | Freq: Four times a day (QID) | ORAL | Status: DC | PRN

## 2024-04-10 NOTE — Progress Notes (Signed)
 Chaplain responded to [facility] consult for Advance Directives. Chaplain provided AD education and assisted pt in completing documents. She named Andrew Carol as her first HCPOA and Javon's father as her second. She is awaiting contact information for Javon. Pt does not elect to complete living will as she states she does want life prolonging treatment in all circumstances at present.   Documents are in the left side of her cabinet for notarization on Wednesday once remaining information has been obtained and pt is back from dialysis.  Alan HERO. Davee Lomax, M.Div. [facility] Chaplain Pager [phone] Office [phone]

## 2024-04-10 NOTE — Progress Notes (Signed)
 RN entered room and patient upset on the phone with a friend receiving bad news. RN asked if patient would like RN to come back at later time but patient said no. RN demanding dilaudid . RN explained it was for dressing changes only and that her BP was too low. Patient became verbally aggressive towards staff, demanding to speak with administrator, and wanting to fire current RN. Patient took midodrine  but yelled at RN to leave the room and refused all other meds. Charge notified.

## 2024-04-10 NOTE — Progress Notes (Signed)
 "  [facility] Progress Note   Subjective:  Seen in room. Nursing performing patient care. No new complaints. Dialysis rollover today.   Objective Vitals:   04/09/24 2314 04/10/24 0401 04/10/24 0800 04/10/24 1228  BP: 125/70     Pulse: 77 87 84   Resp:   20 15  Temp: 97.9 F (36.6 C) 99.3 F (37.4 C) 98.8 F (37.1 C)   TempSrc: Oral Axillary Axillary   SpO2:   98%   Weight:      Height:       Physical Exam General: chronically ill appearing, nad  Heart: RRR, no murmur Lungs: CTA bilaterally, respirations unlabored Abdomen: Soft, non-distended Extremities: 1-2+ edema bilateral lower extremities tender, rt LE amputation Dialysis Access: LIJ temp (1/27), lt BCF pulsatile and augments but very floppy skin  Additional Objective Labs: Basic Metabolic Panel: Recent Labs  Lab 04/05/24 1316 04/07/24 0000 04/07/24 1504 04/10/24 0501  NA 138 138 138 143  K 3.8 4.0 3.5 3.9  CL 103 101 101 106  CO2 24 22 26 23   GLUCOSE 77 57* 83 85  BUN 21* 28* 14 23*  CREATININE 4.39* 5.45* 3.31* 5.66*  CALCIUM  7.8* 7.6* 7.9* 8.4*  PHOS 2.2*  --  2.4*  --    Liver Function Tests: Recent Labs  Lab 04/05/24 1316 04/07/24 1504 04/10/24 0501  AST  --   --  15  ALT  --   --  <5  ALKPHOS  --   --  100  BILITOT  --   --  0.3  PROT  --   --  4.8*  ALBUMIN  2.6* 2.9* 2.7*   No results for input(s): LIPASE, AMYLASE in the last 168 hours.  CBC: Recent Labs  Lab 04/05/24 1316 04/07/24 0000 04/07/24 1504 04/10/24 0501  WBC 15.8* 17.7* 14.3* 16.4*  NEUTROABS  --   --  10.3* 12.1*  HGB 8.3* 8.7* 8.5* 9.2*  HCT 25.6* 26.5* 26.0* 27.7*  MCV 97.7 97.4 97.4 98.6  PLT 238 267 232 264   Blood Culture    Component Value Date/Time   SDES BLOOD RIGHT ANTECUBITAL 03/16/2024 1949   SPECREQUEST  03/16/2024 1949    BOTTLES DRAWN AEROBIC AND ANAEROBIC Blood Culture results may not be optimal due to an inadequate volume of blood received in culture bottles   CULT (A)  03/16/2024 1949    KLEBSIELLA PNEUMONIAE Confirmed Extended Spectrum Beta-Lactamase Producer (ESBL).  In bloodstream infections from ESBL organisms, carbapenems are preferred over piperacillin /tazobactam. They are shown to have a lower risk of mortality.    REPTSTATUS 03/19/2024 FINAL 03/16/2024 1949    Cardiac Enzymes: No results for input(s): CKTOTAL, CKMB, CKMBINDEX, TROPONINI in the last 168 hours. CBG: Recent Labs  Lab 04/09/24 1155 04/09/24 1619 04/09/24 2133 04/10/24 0748 04/10/24 1213  GLUCAP 83 69* 87 88 77   Iron  Studies: No results for input(s): IRON , TIBC, TRANSFERRIN, FERRITIN in the last 72 hours. @lablastinr3 @ Studies/Results: DG Chest Port 1 View Result Date: 04/09/2024 CLINICAL DATA:  [name]. EXAM: PORTABLE CHEST 1 VIEW COMPARISON:  04/01/2024 and CT chest 04/25/2023. FINDINGS: Trachea is midline. Heart is enlarged, stable. Left IJ dialysis catheter tips are in the SVC. Minimal streaky densities in the lung bases. No airspace consolidation or pleural fluid. IMPRESSION: Minimal streaky volume loss in the lung bases. Electronically Signed   By: Newell Eke M.D.   On: 04/09/2024 09:57    Medications:  ertapenem  500 mg (04/09/24 2157)    amiodarone   200 mg Oral Daily   ascorbic acid   500 mg Oral BID   atorvastatin   10 mg Oral Daily   busPIRone   5 mg Oral TID   darbepoetin (ARANESP ) injection - DIALYSIS  150 mcg Subcutaneous Q Wed-1800   dexamethasone  (DECADRON ) injection  6 mg Intravenous Q24H   dextrose   50 mL Intravenous Once   famotidine   20 mg Oral QHS   fluconazole   200 mg Oral Weekly   Gerhardt's butt cream   Topical BID   heparin  injection (subcutaneous)  5,000 Units Subcutaneous Q8H   linezolid   600 mg Oral Q12H   loperamide   4 mg Oral Once   midodrine   15 mg Oral Q8H   multivitamin  1 tablet Oral QHS   mouth rinse  15 mL Mouth Rinse 4 times per day   risperiDONE   1 mg Oral QHS   sodium chloride  flush  10-40 mL  Intracatheter Q12H   vancomycin   125 mg Oral Daily    [facility] TTS 2nd shift 4hr 180NRe 400/600 EDW 96.1kg  3/2.5 Mircera 225mcg q2wk Hectorol   Assessment/Plan: ESRD - Usual TTS schedule ->  Intermittently refusing dialysis.  - s/p CRRT 1/10-1/13 given significant shock - Dialyzed Friday 1/23 prior to [facility] removal.  Appreciate VIR placing LIJ temp cath on 1/27 Eventually will need TC; maybe conversion later in the week. Lt BCF is not usable at this time (very floppy skin and will only lead to infiltration) Last HD on 1/30. Plan for dialysis today 2/3.  WBCs 16.4 so hold on [facility] placement.   PAfib - cardiology following, back on PO amiodarone    Hypotension/volume - Septic on admit,  S/p pressors - Now on high dose midodrine  20mg  TID + extra prn with HD. - primary starting therapeutic trial of Decadron     Sepsis/ESBL Klebsiella bacteremia - On ertapenem , has been ordered for outpatient. Per RN note 1/30 note-- OP clinic does have IV ertapenem  - Blood Cx 03/16/24 positive - issues getting repeat cultures as she has horrible peripheral veins - TDC is out -> currently with LIJ temp - Complete 14 days IV ertapenem  from 1/23. EOT 04/13/24    Progressive dusky rash to pelvic/groin area, lower abd pannus and B legs/suspected necrotizing fascitis/cellulitis - seen by general surgery for worsening fever, hypotension - s/p debridement on 04/02/24 -on  combination of linezolid , ertapenem , fluconazole  per ID   Anemia of ESRD - Hgb 8.5 - S/p 2U PRBCs on 1/23 - Continue weekly Aranesp    Diarrhea - On empiric PO Vanc given Hx C.diff - Toxin negative on 1/10   T2DM - Insulin  per primary   Dispo/GOC - Palliative following. Not ready for de-escalation of care. - Plan is SNF   Xavien Dauphinais Ronnald Acosta PA-C [facility] 04/10/2024,12:44 PM   "

## 2024-04-10 NOTE — Progress Notes (Addendum)
" °   04/10/24 1822  Vitals  Temp 98.6 F (37 C)  Pulse Rate 100  Resp (!) 21  BP (!) 143/68  SpO2 100 %  O2 Device Room Air  Weight 45.4 kg  Type of Weight Post-Dialysis  Oxygen Therapy  Patient Activity (if Appropriate) In bed  Pulse Oximetry Type Continuous  Oximetry Probe Site Changed No  Post Treatment  Dialyzer Clearance Lightly streaked  Liters Processed 58.7  Fluid Removed (mL) 2.5 mL  Tolerated HD Treatment Yes  Post-Hemodialysis Comments 3hours   Received patient in bed to unit.  Alert and oriented.  Informed consent signed and in chart.   TX duration:3  Patient tolerated well.  Transported back to the room  Alert, without acute distress.  Hand-off given to patient's nurse.   Access used: Yes Access issues: No  Total UF removed: 2500 Medication(s) given: See MAR Post HD VS: See Above Grid Post HD weight: unable to get a weight   Zebedee DELENA [facility] "

## 2024-04-11 LAB — GLUCOSE, CAPILLARY
Glucose-Capillary: 101 mg/dL — ABNORMAL HIGH (ref 70–99)
Glucose-Capillary: 124 mg/dL — ABNORMAL HIGH (ref 70–99)
Glucose-Capillary: 169 mg/dL — ABNORMAL HIGH (ref 70–99)
Glucose-Capillary: 185 mg/dL — ABNORMAL HIGH (ref 70–99)

## 2024-04-11 MED ORDER — COSYNTROPIN 0.25 MG IJ SOLR
0.2500 mg | Freq: Once | INTRAMUSCULAR | Status: DC
  Filled 2024-04-11: qty 0.25

## 2024-04-11 MED ORDER — DEXAMETHASONE SODIUM PHOSPHATE 4 MG/ML IJ SOLN
4.0000 mg | Freq: Two times a day (BID) | INTRAMUSCULAR | Status: DC
  Administered 2024-04-11 – 2024-04-12 (×3): 4 mg via INTRAVENOUS
  Filled 2024-04-11 (×3): qty 1

## 2024-04-11 NOTE — Plan of Care (Signed)
 Pt has rested quietly throughout the night with no distress noted. Alert and oriented to person and place but gets confused at times. Dilaudid  given once for pain with relief noted. After dilaudid  given, pt went to sleep and has refused turns and some meds. No other complaints voiced.     Problem: Education: Goal: Knowledge of General Education information will improve Description: Including pain rating scale, medication(s)/side effects and non-pharmacologic comfort measures Outcome: Progressing   Problem: Clinical Measurements: Goal: Respiratory complications will improve Outcome: Progressing Goal: Cardiovascular complication will be avoided Outcome: Progressing   Problem: Nutrition: Goal: Adequate nutrition will be maintained Outcome: Progressing   Problem: Coping: Goal: Level of anxiety will decrease Outcome: Progressing   Problem: Pain Managment: Goal: General experience of comfort will improve and/or be controlled Outcome: Progressing

## 2024-04-11 NOTE — Plan of Care (Signed)

## 2024-04-11 NOTE — Progress Notes (Signed)
 " PROGRESS NOTE  Deborah [name]  FMW:[id number] DOB: [birth year] DOA: 03/16/2024 PCP: Pcp, No   Brief Narrative: Patient is a 59 year old female with past medical history of paroxysmal A-fib, hyperlipidemia, right BKA, ESRD on dialysis on TTS schedule who presented from skilled nursing facility for further evaluation of syncopal episode with loss of consciousness.  On presentation, she was hypotensive.  Lab work showed creatinine of 7, troponin of 105, lactic acid of 9.  Patient also reported diffuse abdominal pain.  Patient was admitted and was started on broad-spectrum antibiotics.  She was recently hospitalized on last December for sepsis secondary to left thigh abscess and underwent I&D with general surgery on 02/16/2024.  On this admission, she was found to have sepsis secondary to Klebsiella bacteremia and was on meropenem , she underwent multiple sessions of CRRT .  She was transferred to [facility] service on 03/22/2024 but remained hypotensive, patient was also refusing care.  Found to have new progressive bilateral thigh erythema and swelling, concern for necrotizing fasciitis.  General surgery consulted.  Underwent excisional debridement of the left thigh skin, subcutaneous tissue.  Was briefly transferred to ICU for that on 1/25.  Patient transferred back to [facility] service on 1/26. Underwent placement of left internal jugular temporary dialysis catheter by IR today  Major Hospital events:  1/9 ED s/p witnessed syncopal event w/ +LOC in shock, started on Levo/Vaso; Aline/CVC placed 1/10.  CT abdomen pelvis in the ER.  Nonacute.   1/10 CRRT, still on vaso, NE. Esclated to meropenem  for ESBL Klebsiella 1/11 echocardiogram EF 60%.  No acute changes 1/12: off levophed , on vasopressin  1/13: off vasopressin , steroids, and CRRT 1/14: transfer out of ICU, TRH to pick up 1/15 1/23: Transfused units PRBC for hemoglobin of 6.7 1/23: TDC catheter continued by IR 1/25: Remains febrile, with rapidly  progressive bilateral groin/thigh/lower abdomen area erythema swelling concerning for necrotizing fasciitis 1/27: Underwent placement of left internal jugular temporary dialysis catheter by IR Right IJ central line removed 04/04/2024   Subjective: Patient in bed, appears comfortable, denies any headache, no fever, no chest pain or pressure, no shortness of breath , no abdominal pain. No focal weakness.   Assessment & Plan:  Suspected necrotizing fasciitis/cellulitis of left lower extremity, history of abscess in the left thigh before:   Developed rapidly progressive lower extremity edema.  There was concern for necrotizing fasciitis.  General surgery consulted.  Underwent excisional debridement of the left thigh on 04/02/24.  She also had a history of left groin abscess on last admission and underwent I&D by Dr. Vernetta on 02/16/2024.  At that time CT abdomen/pelvis did not show any acute findings.  She was also seen by orthopedics, Dr. Harden on 03/22/2024.  Repeat CT abdomen pelvis and left thigh was ordered on 04/09/2024, unfortunately patient refused blood test despite extensive counseling by me and nurse on 04/09/2021.  ID and CCS following currently on combination of linezolid  stop date on 04/15/2024, ertapenem  stop date 04/12/2024, fluconazole  stop date of 05/01/2024, continue prophylactic oral Vanco stop date 04/21/2024   Septic shock secondary to Klebsiella pneumonia bacteremia: Source of infection was unclear but now appears to be left thigh necrotizing fasciitis, she also had an indwelling HD catheter which was removed and a new catheter placed this admission, ID following kindly see above.  ESRD on dialysis: Nephrology following.  Dialyzed on TTS schedule.  IR placed new temporary dialysis catheter on 1/27  Chronic hypotension: Required vasopressor therapy, currently on midodrine .  Initially required pressors blood  pressure off, during hypotensive episodes random cortisol early morning 04/11/2023 is  around 7, she also is having episodes of hypoglycemia, she at least is exhibiting signs of relative adrenal insufficiency, started on a therapeutic trial of Decadron  with success on 04/11/2024, cosyntropin  stimulation test on 04/12/2024.  Opioid use disorder/substance abuse/history of fibromyalgia: Follows outpatient with [facility] pain clinic.  Minimize narcotics, history of narcotic seeking behavior, use narcotics with caution as blood pressure tends to fall with high doses.  Patient has been counseled multiple times.  History of C. difficile: C. difficile negative.  On prophylactic vancomycin  because she is on antibiotics  Anemia of chronic disease: Likely secondary to ESRD.  Given blood transfusion here.  Currently hemoglobin is stable  Syncope: Echo showed EF of 60 to 65%, mild LVH, a small pericardial effusion posteriorly.  Troponin elevation/paroxysmal A-fib: Not on anticoagulant due to history of GI bleed.  Seen by cardiology for RVR placed on amiodarone  this admission, currently in sinus, echo done which showed preserved EF of 60% and no wall motion abnormality.  Stable from the standpoint.   Hyperlipidemia: On statin  History of duodenal carcinoid tumor s/p resection and lung monitor/gastric ulcer/gastritis/chronic gastroparesis: On H2 blocker,  History of peripheral artery disease: Status post right BKA.  Stump is well-healed  Possible vaginal yeast infection: On weekly Diflucan   Anxiety/depression: On BuSpar , risperidone , trazodone  at home  Diabetes type 2: On sliding scale now.  A1c 4.3 as per 02/15/2024.  Will DC insulin   Debility/deconditioning: Patient is from SNF.  Plan for discharge to SNF when appropriate.  Long length of stay  Hypoglycemia.  See above.  Monitor, D50 as needed.  Lab Results  Component Value Date   HGBA1C 5.0 04/10/2024    Lab Results  Component Value Date   TSH 2.390 04/10/2024    CBG (last 3)  Recent Labs    04/10/24 1213 04/10/24 2059  04/11/24 0816  GLUCAP 77 89 101*      Pressure ulcers:  Had wound on the left thigh, right hip, lower abdomen, buttocks, left heel.  Wound care nurse following .Dr. Harden was consulted to evaluate left thigh postop site.   Nutrition Problem: Moderate Malnutrition Etiology: acute illness Wound 02/19/24 0200 Pressure Injury Foot Left;Lateral Unstageable - Full thickness tissue loss in which the base of the injury is covered by slough (yellow, tan, gray, green or brown) and/or eschar (tan, brown or black) in the wound bed. (Active)     Wound 03/17/24 0045 Pressure Injury Thigh Left;Posterior Stage 2 -  Partial thickness loss of dermis presenting as a shallow open injury with a red, pink wound bed without slough. (Active)     Wound 03/17/24 0045 Pressure Injury Hip Lateral;Right Stage 2 -  Partial thickness loss of dermis presenting as a shallow open injury with a red, pink wound bed without slough. (Active)     Wound 03/17/24 0045 Pressure Injury Abdomen Lower;Left Stage 2 -  Partial thickness loss of dermis presenting as a shallow open injury with a red, pink wound bed without slough. (Active)     Wound 03/17/24 1119 Pressure Injury Buttocks Right;Left;Medial Stage 3 -  Full thickness tissue loss. Subcutaneous fat may be visible but bone, tendon or muscle are NOT exposed. (Active)    DVT prophylaxis:heparin  injection 5,000 Units Start: 04/02/24 1400 Place and maintain sequential compression device Start: 03/20/24 1800     Code Status: Full Code  Family Communication: None at the bedside  Patient status: Inpatient inpatient  Patient is  from : SNF  Anticipated discharge to: SNF  Estimated DC date: Not sure.  Needs clearance from ID,nephrology   Consultants: Nephrology, critical care, general surgery, cardiology, palliative care  Procedures: Dialysis, I&D  Objective: Vitals:   04/10/24 2300 04/10/24 2315 04/11/24 0000 04/11/24 0401  BP:  130/71    Pulse:  85  81  Resp: 16   16   Temp:      TempSrc:      SpO2:  100%    Weight:      Height:        Intake/Output Summary (Last 24 hours) at 04/11/2024 0829 Last data filed at 04/10/2024 1822 Gross per 24 hour  Intake --  Output 2.5 ml  Net -2.5 ml     Filed Weights   04/01/24 0500 04/10/24 1547 04/10/24 1822  Weight: 96.5 kg 104 kg (S) 45.4 kg    Examination:  General exam: Overall comfortable, not in distress, chronically deconditioned, lying in hospital bed HEENT: PERRL, has left IJ central line  Respiratory system:  no wheezes or crackles, diminished sounds in bases Cardiovascular system: S1 & S2 heard, RRR.  Gastrointestinal system: Abdomen is nondistended, soft and nontender. Central nervous system: Alert and oriented Extremities: Dressing on the left thigh, right BKA,  ulcer on the left ankle and heel Skin: No rashes,no icterus      Data Review:   Patient Lines/Drains/Airways Status     Active Line/Drains/Airways     Name Placement date Placement time Site Days   Peripheral IV 03/20/24 20 G 2.5 Anterior;Proximal;Right;Upper Arm 03/20/24  1841  Arm  22   Fistula / Graft Left Upper arm Arteriovenous fistula 06/30/22  1200  Upper arm  651   Fistula / Graft Left Upper arm Arteriovenous fistula --  --  Upper arm  --   Hemodialysis Catheter Left Internal jugular Triple lumen Temporary (Non-Tunneled) 04/03/24  0951  Internal jugular  8   Wound 02/16/24 1900 Surgical Open Surgical Incision Groin Anterior;Left;Proximal 02/16/24  1900  Groin  55   Wound 02/19/24 0200 Pressure Injury Foot Left;Lateral Unstageable - Full thickness tissue loss in which the base of the injury is covered by slough (yellow, tan, gray, green or brown) and/or eschar (tan, brown or black) in the wound bed. 02/19/24  0200  Foot  52   Wound 03/17/24 0045 Pressure Injury Thigh Left;Posterior Stage 2 -  Partial thickness loss of dermis presenting as a shallow open injury with a red, pink wound bed without slough. 03/17/24  0045   Thigh  25   Wound 03/17/24 0045 Pressure Injury Hip Lateral;Right Stage 2 -  Partial thickness loss of dermis presenting as a shallow open injury with a red, pink wound bed without slough. 03/17/24  0045  Hip  25   Wound 03/17/24 0045 Pressure Injury Abdomen Lower;Left Stage 2 -  Partial thickness loss of dermis presenting as a shallow open injury with a red, pink wound bed without slough. 03/17/24  0045  Abdomen  25   Wound 03/17/24 1119 Pressure Injury Buttocks Right;Left;Medial Stage 3 -  Full thickness tissue loss. Subcutaneous fat may be visible but bone, tendon or muscle are NOT exposed. 03/17/24  1119  Buttocks  25   Wound 04/01/24 1500 Surgical Thigh Anterior;Distal;Left 04/01/24  1500  Thigh  10             Inpatient Medications  Scheduled Meds:  amiodarone   200 mg Oral Daily   ascorbic acid   500 mg Oral  BID   atorvastatin   10 mg Oral Daily   busPIRone   5 mg Oral TID   [START ON 04/12/2024] cosyntropin   0.25 mg Intravenous Once   darbepoetin (ARANESP ) injection - DIALYSIS  150 mcg Subcutaneous Q Wed-1800   dexamethasone  (DECADRON ) injection  4 mg Intravenous BID   dextrose   50 mL Intravenous Once   famotidine   20 mg Oral QHS   fluconazole   200 mg Oral Weekly   Gerhardt's butt cream   Topical BID   heparin  injection (subcutaneous)  5,000 Units Subcutaneous Q8H   linezolid   600 mg Oral Q12H   midodrine   15 mg Oral Q8H   multivitamin  1 tablet Oral QHS   mouth rinse  15 mL Mouth Rinse 4 times per day   risperiDONE   1 mg Oral QHS   sodium chloride  flush  10-40 mL Intracatheter Q12H   vancomycin   125 mg Oral Daily   Continuous Infusions:  ertapenem  500 mg (04/10/24 2326)   PRN Meds:.acetaminophen , dextrose , heparin  sodium (porcine), HYDROmorphone  (DILAUDID ) injection, hydrOXYzine , mouth rinse, polyethylene glycol, senna, sodium chloride  flush, traMADol   DVT Prophylaxis  heparin  injection 5,000 Units Start: 04/02/24 1400 Place and maintain sequential compression device  Start: 03/20/24 1800   Recent Labs  Lab 04/05/24 1316 04/07/24 0000 04/07/24 1504 04/10/24 0501  WBC 15.8* 17.7* 14.3* 16.4*  HGB 8.3* 8.7* 8.5* 9.2*  HCT 25.6* 26.5* 26.0* 27.7*  PLT 238 267 232 264  MCV 97.7 97.4 97.4 98.6  MCH 31.7 32.0 31.8 32.7  MCHC 32.4 32.8 32.7 33.2  RDW 18.0* 18.0* 17.8* 18.2*  LYMPHSABS  --   --  2.1 2.7  MONOABS  --   --  0.9 1.1*  EOSABS  --   --  0.8* 0.3  BASOSABS  --   --  0.1 0.1    Recent Labs  Lab 04/05/24 1316 04/07/24 0000 04/07/24 1504 04/10/24 0501  NA 138 138 138 143  K 3.8 4.0 3.5 3.9  CL 103 101 101 106  CO2 24 22 26 23   ANIONGAP 12 15 11 14   GLUCOSE 77 57* 83 85  BUN 21* 28* 14 23*  CREATININE 4.39* 5.45* 3.31* 5.66*  AST  --   --   --  15  ALT  --   --   --  <5  ALKPHOS  --   --   --  100  BILITOT  --   --   --  0.3  ALBUMIN  2.6*  --  2.9* 2.7*  CRP  --   --   --  6.9*  PROCALCITON  --   --   --  3.16  TSH  --   --   --  2.390  HGBA1C  --   --   --  5.0  MG  --  1.9  --   --   PHOS 2.2*  --  2.4*  --   CALCIUM  7.8* 7.6* 7.9* 8.4*      Recent Labs  Lab 04/05/24 1316 04/07/24 0000 04/07/24 1504 04/10/24 0501  CRP  --   --   --  6.9*  PROCALCITON  --   --   --  3.16  TSH  --   --   --  2.390  HGBA1C  --   --   --  5.0  MG  --  1.9  --   --   CALCIUM  7.8* 7.6* 7.9* 8.4*    --------------------------------------------------------------------------------------------------------------- Lab Results  Component Value Date  CHOL 104 07/22/2019   HDL 30 (L) 07/22/2019   LDLCALC 37 07/22/2019   TRIG 185 (H) 07/22/2019   CHOLHDL 3.5 07/22/2019    Lab Results  Component Value Date   HGBA1C 5.0 04/10/2024   Recent Labs    04/10/24 0501  TSH 2.390  FREET4 1.26   No results for input(s): VITAMINB12, FOLATE, FERRITIN, TIBC, IRON , RETICCTPCT in the last 72 hours. ------------------------------------------------------------------------------------------------------------------ Cardiac  Enzymes No results for input(s): CKMB, TROPONINI, MYOGLOBIN in the last 168 hours.  Invalid input(s): CK  Micro Results No results found for this or any previous visit (from the past 240 hours).  Radiology Reports  DG Chest Port 1 View Result Date: 04/09/2024 CLINICAL DATA:  [name]. EXAM: PORTABLE CHEST 1 VIEW COMPARISON:  04/01/2024 and CT chest 04/25/2023. FINDINGS: Trachea is midline. Heart is enlarged, stable. Left IJ dialysis catheter tips are in the SVC. Minimal streaky densities in the lung bases. No airspace consolidation or pleural fluid. IMPRESSION: Minimal streaky volume loss in the lung bases. Electronically Signed   By: Newell Eke M.D.   On: 04/09/2024 09:57       Signature  -   Lavada Stank M.D on 04/11/2024 at 8:29 AM   -  To page go to [email or web address]      P2/06/2024, 8:29 AM  "

## 2024-04-11 NOTE — TOC Progression Note (Signed)
 Transition of Care [facility]) - Progression Note    Patient Details  Name: Deborah [name] MRN: [id number] Date of Birth: Dec 19, 1965  [facility] CM/SW Contact  Inocente GORMAN Kindle, LCSW Phone Number: 04/11/2024, 9:04 AM  Clinical Narrative:    CSW continuing to follow for medical progression.    Expected Discharge Plan: Long Term Nursing Home Barriers to Discharge: Continued Medical Work up               Expected Discharge Plan and Services       Living arrangements for the past 2 months: Skilled Nursing Facility                                       Social Drivers of Health (SDOH) Interventions SDOH Screenings   Food Insecurity: No Food Insecurity (03/18/2024)  Housing: Low Risk (03/18/2024)  Transportation Needs: No Transportation Needs (03/18/2024)  Utilities: Not At Risk (03/18/2024)  Recent Concern: Utilities - At Risk (02/18/2024)  Depression (PHQ2-9): Medium Risk (03/16/2023)  Tobacco Use: Low Risk (04/01/2024)    Readmission Risk Interventions    03/28/2024    3:24 PM 02/20/2024   11:05 AM 03/07/2023    4:19 PM  Readmission Risk Prevention Plan  Transportation Screening Complete Complete Complete  Medication Review Oceanographer) Complete Complete Complete  PCP or Specialist appointment within 3-5 days of discharge Complete Complete Complete  HRI or Home Care Consult Complete Complete Complete  SW Recovery Care/Counseling Consult Complete Complete Complete  Palliative Care Screening Complete Not Applicable Complete  Skilled Nursing Facility Complete Complete Not Applicable

## 2024-04-11 NOTE — Progress Notes (Signed)
 "                                                                                                                                                         Daily Progress Note   Patient Name: Deborah [name]       Date: 04/11/2024 DOB: 10-26-1965  Age: 59 y.o. MRN#: [id number] Attending Physician: Dennise Lavada POUR, MD Primary Care Physician: Pcp, No Admit Date: 03/16/2024  Reason for Consultation/Follow-up: Establishing goals of care  Subjective: Patient has not been having a good day due to a bad dream she had last night.  Her mother is present visiting, encouraging her to eat.  Provided additional education surrounding possibility of adrenal insufficiency.  Patient continues to have a hard time understanding this despite review of role of hormones and hypotension and hypoglycemia.  Provided explanation of paperwork patient's that mother brought with her from [place] [facility], reviewing that a follow-up visit in the outpatient setting is anticipated and transportation will likely be arranged by SNF.  Patient's mother appraised facility's transportation efforts in the past.  Explored patient's thoughts on proceeding with notarization of advance directives today.  She would prefer to wait until she could get her nieces addressed to including the paper.  Objective:  Medical records reviewed including progress notes from today and labs.  Plan for cosyntropin  stimulation test tomorrow.  No new labs to review.  Physical Exam Vitals and nursing note reviewed.  Constitutional:      General: She is not in acute distress. HENT:     Head: Normocephalic and atraumatic.  Cardiovascular:     Rate and Rhythm: Normal rate.  Pulmonary:     Effort: Pulmonary effort is normal.  Skin:    General: Skin is dry.     Comments: Peeling rash over breasts  Neurological:     Mental Status: She is alert. Mental status is at baseline.  Psychiatric:        Mood and Affect: Mood is depressed.             Vital Signs: BP (!) 103/54 (BP Location: Left Arm)   Pulse 86   Temp 98.6 F (37 C) (Oral)   Resp 18   Ht 5' 6 (1.676 m)   Wt (S) 45.4 kg Comment: Unable to get a weight  LMP 10/10/2012   SpO2 100%   BMI 16.14 kg/m  SpO2: SpO2: 100 % O2 Device: O2 Device: Room Air O2 Flow Rate: O2 Flow Rate (L/min): 2 L/min      Palliative Assessment/Data: 30%   Palliative Care Assessment & Plan   Patient Profile: 59 y.o. female  with past medical history of PAF, right BKA, end-stage renal disease on dialysis, chronic pain in her abdomen and bilateral legs admitted on 03/16/2024 with syncopal episode and loss of  consciousness at SNF.    In the ED, patient was found to have metabolic acidosis and lactic acid of 9, SIRS criteria, ultimately admitted for septic shock requiring pressors.  She was also recently hospitalized in December 2025 for sepsis secondary to left thigh abscess.  Chart review also shows that she was discharged from physical medicine and rehabilitation clinic for her pain management in January 2025 due to positive drug screen via oral swab.  Patient reports she is now seen by [facility] pain clinic.  Refused HD today due to pain.   PMT has been consulted to assist with goals of care conversation.  She was last seen by PMT for goals of care discussion in December 2024.  Assessment: Goals of care conversation End-stage renal disease on dialysis Persistent bacteremia left thigh soft tissue infection  Recommendations/Plan: Ongoing education on patient's disease processes and treatments, goals of care discussions Patient defers completion of advance directives today Psychosocial and emotional support provided PMT will continue to follow and support weekly   Prognosis: Poor  Discharge Planning: SNF  Care plan was discussed with patient, RN, patient's mother   Dawson Albers SHAUNNA Fell, PA-C  Palliative Medicine Team Team phone # [phone]  Thank you for allowing the  Palliative Medicine Team to assist in the care of this patient. Please utilize secure chat with additional questions, if there is no response within 30 minutes please call the above phone number.  Palliative Medicine Team providers are available by phone from 7am to 7pm daily and can be reached through the team cell phone.  Should this patient require assistance outside of these hours, please call the patient's attending physician.    I personally spent a total of 25 minutes in the care of the patient today including preparing to see the patient, getting/reviewing separately obtained history, performing a medically appropriate exam/evaluation, counseling and educating, and documenting clinical information in the EHR.  "

## 2024-04-11 NOTE — Progress Notes (Signed)
 "  [facility] Progress Note   Subjective:  Seen in room.States body hurts all over. She did have dialysis yesterday - net UF 2.5L   Objective Vitals:   04/11/24 0000 04/11/24 0401 04/11/24 0800 04/11/24 1200  BP:   (!) 103/54 113/66  Pulse:  81 86 86  Resp: 16  18 (!) 24  Temp:   98.6 F (37 C) 98.8 F (37.1 C)  TempSrc:   Oral Axillary  SpO2:      Weight:      Height:       Physical Exam General: chronically ill appearing, nad  Heart: RRR, no murmur Lungs: CTA bilaterally, respirations unlabored Abdomen: Soft, non-distended Extremities: 1-2+ edema bilateral lower extremities tender, rt LE amputation Dialysis Access: LIJ temp (1/27), lt BCF pulsatile and augments but very floppy skin  Additional Objective Labs: Basic Metabolic Panel: Recent Labs  Lab 04/05/24 1316 04/07/24 0000 04/07/24 1504 04/10/24 0501  NA 138 138 138 143  K 3.8 4.0 3.5 3.9  CL 103 101 101 106  CO2 24 22 26 23   GLUCOSE 77 57* 83 85  BUN 21* 28* 14 23*  CREATININE 4.39* 5.45* 3.31* 5.66*  CALCIUM  7.8* 7.6* 7.9* 8.4*  PHOS 2.2*  --  2.4*  --    Liver Function Tests: Recent Labs  Lab 04/05/24 1316 04/07/24 1504 04/10/24 0501  AST  --   --  15  ALT  --   --  <5  ALKPHOS  --   --  100  BILITOT  --   --  0.3  PROT  --   --  4.8*  ALBUMIN  2.6* 2.9* 2.7*   No results for input(s): LIPASE, AMYLASE in the last 168 hours.  CBC: Recent Labs  Lab 04/05/24 1316 04/07/24 0000 04/07/24 1504 04/10/24 0501  WBC 15.8* 17.7* 14.3* 16.4*  NEUTROABS  --   --  10.3* 12.1*  HGB 8.3* 8.7* 8.5* 9.2*  HCT 25.6* 26.5* 26.0* 27.7*  MCV 97.7 97.4 97.4 98.6  PLT 238 267 232 264   Blood Culture    Component Value Date/Time   SDES BLOOD RIGHT ANTECUBITAL 03/16/2024 1949   SPECREQUEST  03/16/2024 1949    BOTTLES DRAWN AEROBIC AND ANAEROBIC Blood Culture results may not be optimal due to an inadequate volume of blood received in culture bottles   CULT (A) 03/16/2024 1949     KLEBSIELLA PNEUMONIAE Confirmed Extended Spectrum Beta-Lactamase Producer (ESBL).  In bloodstream infections from ESBL organisms, carbapenems are preferred over piperacillin /tazobactam. They are shown to have a lower risk of mortality.    REPTSTATUS 03/19/2024 FINAL 03/16/2024 1949    Cardiac Enzymes: No results for input(s): CKTOTAL, CKMB, CKMBINDEX, TROPONINI in the last 168 hours. CBG: Recent Labs  Lab 04/10/24 0748 04/10/24 1213 04/10/24 2059 04/11/24 0816 04/11/24 1316  GLUCAP 88 77 89 101* 124*   Iron  Studies: No results for input(s): IRON , TIBC, TRANSFERRIN, FERRITIN in the last 72 hours. @lablastinr3 @ Studies/Results: No results found.   Medications:  ertapenem  500 mg (04/10/24 2326)    amiodarone   200 mg Oral Daily   ascorbic acid   500 mg Oral BID   atorvastatin   10 mg Oral Daily   busPIRone   5 mg Oral TID   [START ON 04/12/2024] cosyntropin   0.25 mg Intravenous Once   darbepoetin (ARANESP ) injection - DIALYSIS  150 mcg Subcutaneous Q Wed-1800   dexamethasone  (DECADRON ) injection  4 mg Intravenous BID   dextrose   50 mL Intravenous Once   famotidine   20 mg Oral QHS   fluconazole   200 mg Oral Weekly   Gerhardt's butt cream   Topical BID   heparin  injection (subcutaneous)  5,000 Units Subcutaneous Q8H   linezolid   600 mg Oral Q12H   midodrine   15 mg Oral Q8H   multivitamin  1 tablet Oral QHS   mouth rinse  15 mL Mouth Rinse 4 times per day   risperiDONE   1 mg Oral QHS   sodium chloride  flush  10-40 mL Intracatheter Q12H   vancomycin   125 mg Oral Daily    [facility] TTS 2nd shift 4hr 180NRe 400/600 EDW 96.1kg  3/2.5 Mircera 225mcg q2wk Hectorol   Assessment/Plan: ESRD - Usual TTS schedule ->  Intermittently refusing dialysis.  - s/p CRRT 1/10-1/13 given significant shock - Dialyzed Friday 1/23 prior to [facility] removal.  Appreciate VIR placing LIJ temp cath on 1/27 Eventually will need TC; maybe conversion later in the week. Lt BCF is not usable  at this time (very floppy skin and will only lead to infiltration) - Next HD Thurs  -  WBCs 16.4 so hold on [facility] placement.   PAfib - cardiology following, back on PO amiodarone    Hypotension/volume - Septic on admit,  S/p pressors - Now on high dose midodrine  20mg  TID + extra prn with HD. - primary starting therapeutic trial of Decadron     Sepsis/ESBL Klebsiella bacteremia - On ertapenem , has been ordered for outpatient. Per RN note 1/30 note-- OP clinic does have IV ertapenem  - Blood Cx 03/16/24 positive - issues getting repeat cultures as she has horrible peripheral veins - TDC is out -> currently with LIJ temp - Complete 14 days IV ertapenem  from 1/23. EOT 04/13/24    Progressive dusky rash to pelvic/groin area, lower abd pannus and B legs/suspected necrotizing fascitis/cellulitis - seen by general surgery for worsening fever, hypotension - s/p debridement on 04/02/24 -on  combination of linezolid , ertapenem , fluconazole  per ID   Anemia of ESRD - Hgb 9.2 - S/p 2U PRBCs on 1/23 - Continue weekly Aranesp    Diarrhea - On empiric PO Vanc given Hx C.diff - Toxin negative on 1/10   T2DM - Insulin  per primary   Dispo/GOC - Palliative following. Not ready for de-escalation of care. - Plan is SNF   Shawnelle Spoerl Ronnald Acosta PA-C [facility] 04/11/2024,2:48 PM   "

## 2024-04-12 ENCOUNTER — Inpatient Hospital Stay (HOSPITAL_COMMUNITY)

## 2024-04-12 LAB — CBC WITH DIFFERENTIAL/PLATELET
Abs Immature Granulocytes: 0.2 10*3/uL — ABNORMAL HIGH (ref 0.00–0.07)
Basophils Absolute: 0 10*3/uL (ref 0.0–0.1)
Basophils Relative: 0 %
Eosinophils Absolute: 0 10*3/uL (ref 0.0–0.5)
Eosinophils Relative: 0 %
HCT: 28.3 % — ABNORMAL LOW (ref 36.0–46.0)
Hemoglobin: 9.3 g/dL — ABNORMAL LOW (ref 12.0–15.0)
Immature Granulocytes: 1 %
Lymphocytes Relative: 10 %
Lymphs Abs: 1.7 10*3/uL (ref 0.7–4.0)
MCH: 32.3 pg (ref 26.0–34.0)
MCHC: 32.9 g/dL (ref 30.0–36.0)
MCV: 98.3 fL (ref 80.0–100.0)
Monocytes Absolute: 0.3 10*3/uL (ref 0.1–1.0)
Monocytes Relative: 2 %
Neutro Abs: 15.8 10*3/uL — ABNORMAL HIGH (ref 1.7–7.7)
Neutrophils Relative %: 87 %
Platelets: 262 10*3/uL (ref 150–400)
RBC: 2.88 MIL/uL — ABNORMAL LOW (ref 3.87–5.11)
RDW: 17.6 % — ABNORMAL HIGH (ref 11.5–15.5)
WBC: 18 10*3/uL — ABNORMAL HIGH (ref 4.0–10.5)
nRBC: 0.2 % (ref 0.0–0.2)

## 2024-04-12 LAB — BASIC METABOLIC PANEL WITH GFR
Anion gap: 14 (ref 5–15)
BUN: 30 mg/dL — ABNORMAL HIGH (ref 6–20)
CO2: 24 mmol/L (ref 22–32)
Calcium: 8.6 mg/dL — ABNORMAL LOW (ref 8.9–10.3)
Chloride: 102 mmol/L (ref 98–111)
Creatinine, Ser: 5.27 mg/dL — ABNORMAL HIGH (ref 0.44–1.00)
GFR, Estimated: 9 mL/min — ABNORMAL LOW
Glucose, Bld: 272 mg/dL — ABNORMAL HIGH (ref 70–99)
Potassium: 4.5 mmol/L (ref 3.5–5.1)
Sodium: 140 mmol/L (ref 135–145)

## 2024-04-12 LAB — GLUCOSE, CAPILLARY
Glucose-Capillary: 141 mg/dL — ABNORMAL HIGH (ref 70–99)
Glucose-Capillary: 214 mg/dL — ABNORMAL HIGH (ref 70–99)
Glucose-Capillary: 254 mg/dL — ABNORMAL HIGH (ref 70–99)
Glucose-Capillary: 239 mg/dL — ABNORMAL HIGH (ref 70–99)

## 2024-04-12 LAB — C-REACTIVE PROTEIN: CRP: 3.2 mg/dL — ABNORMAL HIGH

## 2024-04-12 MED ORDER — ALTEPLASE 2 MG IJ SOLR
2.0000 mg | Freq: Once | INTRAMUSCULAR | Status: AC
  Administered 2024-04-12: 2 mg
  Filled 2024-04-12: qty 2

## 2024-04-12 NOTE — Progress Notes (Signed)
 At bedside but cathflo is not available as of this time. To come back once med is ready. Ordered  earlier.

## 2024-04-12 NOTE — Progress Notes (Signed)
 " [facility] Progress Note   Subjective:    Seen and examined patient at bedside. She denies any issues/complaints. Plan for HD later today.  Objective Vitals:   04/11/24 1942 04/12/24 0000 04/12/24 0400 04/12/24 0833  BP: 131/81 (!) 137/90 136/75 124/78  Pulse: 88 82  90  Resp:    20  Temp: 99 F (37.2 C) 98.8 F (37.1 C)  97.8 F (36.6 C)  TempSrc: Oral Oral  Oral  SpO2:      Weight:      Height:       Physical Exam General: chronically ill appearing, nad  Heart: RRR, no murmur Lungs: CTA bilaterally, respirations unlabored Abdomen: Soft, non-distended Extremities: 1-2+ edema bilateral lower extremities tender, rt LE amputation Dialysis Access: LIJ temp (1/27), lt BCF pulsatile and augments but very floppy skin  Filed Weights   04/01/24 0500 04/10/24 1547 04/10/24 1822  Weight: 96.5 kg 104 kg (S) 45.4 kg   No intake or output data in the 24 hours ending 04/12/24 1355  Additional Objective Labs: Basic Metabolic Panel: Recent Labs  Lab 04/07/24 0000 04/07/24 1504 04/10/24 0501  NA 138 138 143  K 4.0 3.5 3.9  CL 101 101 106  CO2 22 26 23   GLUCOSE 57* 83 85  BUN 28* 14 23*  CREATININE 5.45* 3.31* 5.66*  CALCIUM  7.6* 7.9* 8.4*  PHOS  --  2.4*  --    Liver Function Tests: Recent Labs  Lab 04/07/24 1504 04/10/24 0501  AST  --  15  ALT  --  <5  ALKPHOS  --  100  BILITOT  --  0.3  PROT  --  4.8*  ALBUMIN  2.9* 2.7*   No results for input(s): LIPASE, AMYLASE in the last 168 hours. CBC: Recent Labs  Lab 04/07/24 0000 04/07/24 1504 04/10/24 0501  WBC 17.7* 14.3* 16.4*  NEUTROABS  --  10.3* 12.1*  HGB 8.7* 8.5* 9.2*  HCT 26.5* 26.0* 27.7*  MCV 97.4 97.4 98.6  PLT 267 232 264   Blood Culture    Component Value Date/Time   SDES BLOOD RIGHT ANTECUBITAL 03/16/2024 1949   SPECREQUEST  03/16/2024 1949    BOTTLES DRAWN AEROBIC AND ANAEROBIC Blood Culture results may not be optimal due to an inadequate volume of blood received in  culture bottles   CULT (A) 03/16/2024 1949    KLEBSIELLA PNEUMONIAE Confirmed Extended Spectrum Beta-Lactamase Producer (ESBL).  In bloodstream infections from ESBL organisms, carbapenems are preferred over piperacillin /tazobactam. They are shown to have a lower risk of mortality.    REPTSTATUS 03/19/2024 FINAL 03/16/2024 1949    Cardiac Enzymes: No results for input(s): CKTOTAL, CKMB, CKMBINDEX, TROPONINI in the last 168 hours. CBG: Recent Labs  Lab 04/11/24 1316 04/11/24 1611 04/11/24 2122 04/12/24 0836 04/12/24 1230  GLUCAP 124* 169* 185* 141* 214*   Iron  Studies: No results for input(s): IRON , TIBC, TRANSFERRIN, FERRITIN in the last 72 hours. Lab Results  Component Value Date   INR 1.8 (H) 03/18/2024   INR 1.9 (H) 03/17/2024   INR 1.5 (H) 03/22/2023   Studies/Results: No results found.  Medications:  ertapenem  Stopped (04/12/24 0720)    amiodarone   200 mg Oral Daily   ascorbic acid   500 mg Oral BID   atorvastatin   10 mg Oral Daily   busPIRone   5 mg Oral TID   darbepoetin (ARANESP ) injection - DIALYSIS  150 mcg Subcutaneous Q Wed-1800   dextrose   50 mL Intravenous Once   famotidine   20 mg  Oral QHS   fluconazole   200 mg Oral Weekly   Gerhardt's butt cream   Topical BID   heparin  injection (subcutaneous)  5,000 Units Subcutaneous Q8H   linezolid   600 mg Oral Q12H   midodrine   15 mg Oral Q8H   multivitamin  1 tablet Oral QHS   mouth rinse  15 mL Mouth Rinse 4 times per day   risperiDONE   1 mg Oral QHS   sodium chloride  flush  10-40 mL Intracatheter Q12H   vancomycin   125 mg Oral Daily    Dialysis Orders: [facility] TTS 2nd shift 4hr 180NRe 400/600 EDW 96.1kg  3/2.5 Mircera 225mcg q2wk Hectorol   Assessment/Plan: ESRD - Usual TTS schedule ->  Intermittently refusing dialysis.  - s/p CRRT 1/10-1/13 given significant shock - Dialyzed Friday 1/23 prior to [facility] removal.  Appreciate VIR placing LIJ temp cath on 1/27 Eventually will need TC; maybe  conversion later in the week. Lt BCF is not usable at this time (very floppy skin and will only lead to infiltration) - Next HD later today  -  WBCs 16.4 so hold on [facility] placement.    PAfib - cardiology following, back on PO amiodarone    Hypotension/volume - Septic on admit,  S/p pressors - Now on high dose midodrine  20mg  TID + extra prn with HD. - primary starting therapeutic trial of Decadron     Sepsis/ESBL Klebsiella bacteremia - On ertapenem , has been ordered for outpatient. Per RN note 1/30 note-- OP clinic does have IV ertapenem  - Blood Cx 03/16/24 positive - issues getting repeat cultures as she has horrible peripheral veins - TDC is out -> currently with LIJ temp - Complete 14 days IV ertapenem  from 1/23. EOT 04/13/24    Progressive dusky rash to pelvic/groin area, lower abd pannus and B legs/suspected necrotizing fascitis/cellulitis - seen by general surgery for worsening fever, hypotension - s/p debridement on 04/02/24 -on  combination of linezolid , ertapenem , fluconazole  per ID   Anemia of ESRD - Hgb 9.2 - S/p 2U PRBCs on 1/23 - Continue weekly Aranesp    Diarrhea - On empiric PO Vanc given Hx C.diff - Toxin negative on 1/10   T2DM - Insulin  per primary   Dispo/GOC - Palliative following. Not ready for de-escalation of care. - Plan is SNF  [name], NP [facility] 04/12/2024,1:55 PM  LOS: 27 days    "

## 2024-04-12 NOTE — Plan of Care (Signed)
" °  Problem: Education: Goal: Knowledge of General Education information will improve Description: Including pain rating scale, medication(s)/side effects and non-pharmacologic comfort measures 04/12/2024 0453 by Sidney Ethelene CROME, RN Outcome: Progressing 04/12/2024 0453 by Sidney Ethelene CROME, RN Outcome: Progressing   Problem: Health Behavior/Discharge Planning: Goal: Ability to manage health-related needs will improve 04/12/2024 0453 by Sidney Ethelene CROME, RN Outcome: Progressing 04/12/2024 0453 by Sidney Ethelene CROME, RN Outcome: Progressing   Problem: Clinical Measurements: Goal: Ability to maintain clinical measurements within normal limits will improve 04/12/2024 0453 by Sidney Ethelene CROME, RN Outcome: Progressing 04/12/2024 0453 by Sidney Ethelene CROME, RN Outcome: Progressing Goal: Will remain free from infection 04/12/2024 0453 by Sidney Ethelene CROME, RN Outcome: Progressing 04/12/2024 0453 by Sidney Ethelene CROME, RN Outcome: Progressing Goal: Diagnostic test results will improve 04/12/2024 0453 by Sidney Ethelene CROME, RN Outcome: Progressing 04/12/2024 0453 by Sidney Ethelene CROME, RN Outcome: Progressing Goal: Respiratory complications will improve 04/12/2024 0453 by Sidney Ethelene CROME, RN Outcome: Progressing 04/12/2024 0453 by Sidney Ethelene CROME, RN Outcome: Progressing Goal: Cardiovascular complication will be avoided 04/12/2024 0453 by Sidney Ethelene CROME, RN Outcome: Progressing 04/12/2024 0453 by Sidney Ethelene CROME, RN Outcome: Progressing   Problem: Activity: Goal: Risk for activity intolerance will decrease 04/12/2024 0453 by Sidney Ethelene CROME, RN Outcome: Progressing 04/12/2024 0453 by Sidney Ethelene CROME, RN Outcome: Progressing   Problem: Nutrition: Goal: Adequate nutrition will be maintained 04/12/2024 0453 by Sidney Ethelene CROME, RN Outcome: Progressing 04/12/2024 0453 by Sidney Ethelene CROME, RN Outcome: Progressing   Problem: Coping: Goal: Level of anxiety will  decrease 04/12/2024 0453 by Sidney Ethelene CROME, RN Outcome: Progressing 04/12/2024 0453 by Sidney Ethelene CROME, RN Outcome: Progressing   "

## 2024-04-12 NOTE — Progress Notes (Signed)
 " PROGRESS NOTE  Deborah [name]  FMW:[id number] DOB: [birth year] DOA: 03/16/2024 PCP: Pcp, No   Brief Narrative: Patient is a 59 year old female with past medical history of paroxysmal A-fib, hyperlipidemia, right BKA, ESRD on dialysis on TTS schedule who presented from skilled nursing facility for further evaluation of syncopal episode with loss of consciousness.  On presentation, she was hypotensive.  Lab work showed creatinine of 7, troponin of 105, lactic acid of 9.  Patient also reported diffuse abdominal pain.  Patient was admitted and was started on broad-spectrum antibiotics.  She was recently hospitalized on last December for sepsis secondary to left thigh abscess and underwent I&D with general surgery on 02/16/2024.  On this admission, she was found to have sepsis secondary to Klebsiella bacteremia and was on meropenem , she underwent multiple sessions of CRRT .  She was transferred to [facility] service on 03/22/2024 but remained hypotensive, patient was also refusing care.  Found to have new progressive bilateral thigh erythema and swelling, concern for necrotizing fasciitis.  General surgery consulted.  Underwent excisional debridement of the left thigh skin, subcutaneous tissue.  Was briefly transferred to ICU for that on 1/25.  Patient transferred back to [facility] service on 1/26. Underwent placement of left internal jugular temporary dialysis catheter by IR today  Major Hospital events:  1/9 ED s/p witnessed syncopal event w/ +LOC in shock, started on Levo/Vaso; Aline/CVC placed 1/10.  CT abdomen pelvis in the ER.  Nonacute.   1/10 CRRT, still on vaso, NE. Esclated to meropenem  for ESBL Klebsiella 1/11 echocardiogram EF 60%.  No acute changes 1/12: off levophed , on vasopressin  1/13: off vasopressin , steroids, and CRRT 1/14: transfer out of ICU, TRH to pick up 1/15 1/23: Transfused units PRBC for hemoglobin of 6.7 1/23: TDC catheter continued by IR 1/25: Remains febrile, with rapidly  progressive bilateral groin/thigh/lower abdomen area erythema swelling concerning for necrotizing fasciitis 1/27: Underwent placement of left internal jugular temporary dialysis catheter by IR Right IJ central line removed 04/04/2024   Subjective:   Patient in bed, appears comfortable, denies any complaints today, diarrhea has improved     Assessment & Plan:  Suspected necrotizing fasciitis/cellulitis of left lower extremity, history of abscess in the left thigh before:  Developed rapidly progressive lower extremity edema.  There was concern for necrotizing fasciitis.  General surgery consulted.  Underwent excisional debridement of the left thigh on 04/02/24.  She also had a history of left groin abscess on last admission and underwent I&D by Dr. Vernetta on 02/16/2024.  At that time CT abdomen/pelvis did not show any acute findings.  She was also seen by orthopedics, Dr. Harden on 03/22/2024.  Repeat CT abdomen pelvis and left thigh was ordered on 04/09/2024, unfortunately patient refused blood test despite extensive counseling by me and nurse on 04/09/2021.  ID and CCS following currently on combination of linezolid  stop date on 04/15/2024, ertapenem  stop date 04/12/2024, fluconazole  stop date of 05/01/2024, continue prophylactic oral Vanco stop date 04/21/2024   Septic shock secondary to Klebsiella pneumonia bacteremia: Source of infection was unclear but now appears to be left thigh necrotizing fasciitis, she also had an indwelling HD catheter which was removed and a new catheter placed this admission, ID following kindly see above.  ESRD on dialysis: Nephrology following.  Dialyzed on TTS schedule.  IR placed new temporary dialysis catheter on 1/27  Chronic hypotension:  - Patient with chronic hypotension at baseline. - This has improved with midodrine  and albumin  . - Cortisol level on the  lower side, but she has been on Decadron , so cannot do cosyntropin  stimulation test, will hold Decadron  for 48  hours prior to stim test.  Opioid use disorder/substance abuse/history of fibromyalgia: Follows outpatient with [facility] pain clinic.  Minimize narcotics, history of narcotic seeking behavior, use narcotics with caution as blood pressure tends to fall with high doses.  Patient has been counseled multiple times.  History of C. difficile: C. difficile negative.  On prophylactic vancomycin  because she is on antibiotics  Anemia of chronic disease: Likely secondary to ESRD.  Given blood transfusion here.  Currently hemoglobin is stable  Syncope: Echo showed EF of 60 to 65%, mild LVH, a small pericardial effusion posteriorly.  Troponin elevation/paroxysmal A-fib: Not on anticoagulant due to history of GI bleed.  Seen by cardiology for RVR placed on amiodarone  this admission, currently in sinus, echo done which showed preserved EF of 60% and no wall motion abnormality.  Stable from the standpoint.   Hyperlipidemia: On statin  History of duodenal carcinoid tumor s/p resection and lung monitor/gastric ulcer/gastritis/chronic gastroparesis: On H2 blocker,  History of peripheral artery disease: Status post right BKA.  Stump is well-healed  Possible vaginal yeast infection: On weekly Diflucan   Anxiety/depression: On BuSpar , risperidone , trazodone  at home  Diabetes type 2: On sliding scale now.  A1c 4.3 as per 02/15/2024.  Will DC insulin   Debility/deconditioning: Patient is from SNF.  Plan for discharge to SNF when appropriate.  Long length of stay  Hypoglycemia.  See above.  Monitor, D50 as needed.  Lab Results  Component Value Date   HGBA1C 5.0 04/10/2024    Lab Results  Component Value Date   TSH 2.390 04/10/2024    CBG (last 3)  Recent Labs    04/11/24 1611 04/11/24 2122 04/12/24 0836  GLUCAP 169* 185* 141*      Pressure ulcers:  Had wound on the left thigh, right hip, lower abdomen, buttocks, left heel.  Wound care nurse following .Dr. Harden was consulted to evaluate left  thigh postop site.   Nutrition Problem: Moderate Malnutrition Etiology: acute illness Wound 02/19/24 0200 Pressure Injury Foot Left;Lateral Unstageable - Full thickness tissue loss in which the base of the injury is covered by slough (yellow, tan, gray, green or brown) and/or eschar (tan, brown or black) in the wound bed. (Active)     Wound 03/17/24 0045 Pressure Injury Thigh Left;Posterior Stage 2 -  Partial thickness loss of dermis presenting as a shallow open injury with a red, pink wound bed without slough. (Active)     Wound 03/17/24 0045 Pressure Injury Hip Lateral;Right Stage 2 -  Partial thickness loss of dermis presenting as a shallow open injury with a red, pink wound bed without slough. (Active)     Wound 03/17/24 0045 Pressure Injury Abdomen Lower;Left Stage 2 -  Partial thickness loss of dermis presenting as a shallow open injury with a red, pink wound bed without slough. (Active)     Wound 03/17/24 1119 Pressure Injury Buttocks Right;Left;Medial Stage 3 -  Full thickness tissue loss. Subcutaneous fat may be visible but bone, tendon or muscle are NOT exposed. (Active)    DVT prophylaxis:heparin  injection 5,000 Units Start: 04/02/24 1400 Place and maintain sequential compression device Start: 03/20/24 1800     Code Status: Full Code  Family Communication: None at the bedside  Patient status: Inpatient inpatient  Patient is from : SNF  Anticipated discharge to: SNF  Estimated DC date: Not sure.  Needs clearance from ID,nephrology   Consultants:  Nephrology, critical care, general surgery, cardiology, palliative care  Procedures: Dialysis, I&D  Objective: Vitals:   04/11/24 1942 04/12/24 0000 04/12/24 0400 04/12/24 0833  BP: 131/81 (!) 137/90 136/75 124/78  Pulse: 88 82  90  Resp:    20  Temp: 99 F (37.2 C) 98.8 F (37.1 C)  97.8 F (36.6 C)  TempSrc: Oral Oral  Oral  SpO2:      Weight:      Height:       No intake or output data in the 24 hours ending  04/12/24 1215    Filed Weights   04/01/24 0500 04/10/24 1547 04/10/24 1822  Weight: 96.5 kg 104 kg (S) 45.4 kg    Examination:  Awake Alert, appears comfortable, no apparent distress, she is chronically deconditioned, ill-appearing Symmetrical Chest wall movement, decreased bibasilar air movement bilaterally, CTAB RRR,No Gallops,Rubs or new Murmurs, No Parasternal Heave +ve B.Sounds, Abd Soft, No tenderness, No rebound - guarding or rigidity. Left BKA right BKA, ulcer on the left ankle and heel and thigh Left IJ line present     Data Review:   Patient Lines/Drains/Airways Status     Active Line/Drains/Airways     Name Placement date Placement time Site Days   Peripheral IV 03/20/24 20 G 2.5 Anterior;Proximal;Right;Upper Arm 03/20/24  1841  Arm  23   Fistula / Graft Left Upper arm Arteriovenous fistula 06/30/22  1200  Upper arm  652   Fistula / Graft Left Upper arm Arteriovenous fistula --  --  Upper arm  --   Hemodialysis Catheter Left Internal jugular Triple lumen Temporary (Non-Tunneled) 04/03/24  0951  Internal jugular  9   Wound 02/16/24 1900 Surgical Open Surgical Incision Groin Anterior;Left;Proximal 02/16/24  1900  Groin  56   Wound 02/19/24 0200 Pressure Injury Foot Left;Lateral Unstageable - Full thickness tissue loss in which the base of the injury is covered by slough (yellow, tan, gray, green or brown) and/or eschar (tan, brown or black) in the wound bed. 02/19/24  0200  Foot  53   Wound 03/17/24 0045 Pressure Injury Thigh Left;Posterior Stage 2 -  Partial thickness loss of dermis presenting as a shallow open injury with a red, pink wound bed without slough. 03/17/24  0045  Thigh  26   Wound 03/17/24 0045 Pressure Injury Hip Lateral;Right Stage 2 -  Partial thickness loss of dermis presenting as a shallow open injury with a red, pink wound bed without slough. 03/17/24  0045  Hip  26   Wound 03/17/24 0045 Pressure Injury Abdomen Lower;Left Stage 2 -  Partial thickness  loss of dermis presenting as a shallow open injury with a red, pink wound bed without slough. 03/17/24  0045  Abdomen  26   Wound 03/17/24 1119 Pressure Injury Buttocks Right;Left;Medial Stage 3 -  Full thickness tissue loss. Subcutaneous fat may be visible but bone, tendon or muscle are NOT exposed. 03/17/24  1119  Buttocks  26   Wound 04/01/24 1500 Surgical Thigh Anterior;Distal;Left 04/01/24  1500  Thigh  11             Inpatient Medications  Scheduled Meds:  amiodarone   200 mg Oral Daily   ascorbic acid   500 mg Oral BID   atorvastatin   10 mg Oral Daily   busPIRone   5 mg Oral TID   darbepoetin (ARANESP ) injection - DIALYSIS  150 mcg Subcutaneous Q Wed-1800   dexamethasone  (DECADRON ) injection  4 mg Intravenous BID   dextrose   50 mL Intravenous  Once   famotidine   20 mg Oral QHS   fluconazole   200 mg Oral Weekly   Gerhardt's butt cream   Topical BID   heparin  injection (subcutaneous)  5,000 Units Subcutaneous Q8H   linezolid   600 mg Oral Q12H   midodrine   15 mg Oral Q8H   multivitamin  1 tablet Oral QHS   mouth rinse  15 mL Mouth Rinse 4 times per day   risperiDONE   1 mg Oral QHS   sodium chloride  flush  10-40 mL Intracatheter Q12H   vancomycin   125 mg Oral Daily   Continuous Infusions:  ertapenem  Stopped (04/12/24 0720)   PRN Meds:.acetaminophen , dextrose , heparin  sodium (porcine), HYDROmorphone  (DILAUDID ) injection, hydrOXYzine , mouth rinse, polyethylene glycol, senna, sodium chloride  flush, traMADol   DVT Prophylaxis  heparin  injection 5,000 Units Start: 04/02/24 1400 Place and maintain sequential compression device Start: 03/20/24 1800   Recent Labs  Lab 04/05/24 1316 04/07/24 0000 04/07/24 1504 04/10/24 0501  WBC 15.8* 17.7* 14.3* 16.4*  HGB 8.3* 8.7* 8.5* 9.2*  HCT 25.6* 26.5* 26.0* 27.7*  PLT 238 267 232 264  MCV 97.7 97.4 97.4 98.6  MCH 31.7 32.0 31.8 32.7  MCHC 32.4 32.8 32.7 33.2  RDW 18.0* 18.0* 17.8* 18.2*  LYMPHSABS  --   --  2.1 2.7  MONOABS   --   --  0.9 1.1*  EOSABS  --   --  0.8* 0.3  BASOSABS  --   --  0.1 0.1    Recent Labs  Lab 04/05/24 1316 04/07/24 0000 04/07/24 1504 04/10/24 0501  NA 138 138 138 143  K 3.8 4.0 3.5 3.9  CL 103 101 101 106  CO2 24 22 26 23   ANIONGAP 12 15 11 14   GLUCOSE 77 57* 83 85  BUN 21* 28* 14 23*  CREATININE 4.39* 5.45* 3.31* 5.66*  AST  --   --   --  15  ALT  --   --   --  <5  ALKPHOS  --   --   --  100  BILITOT  --   --   --  0.3  ALBUMIN  2.6*  --  2.9* 2.7*  CRP  --   --   --  6.9*  PROCALCITON  --   --   --  3.16  TSH  --   --   --  2.390  HGBA1C  --   --   --  5.0  MG  --  1.9  --   --   PHOS 2.2*  --  2.4*  --   CALCIUM  7.8* 7.6* 7.9* 8.4*      Recent Labs  Lab 04/05/24 1316 04/07/24 0000 04/07/24 1504 04/10/24 0501  CRP  --   --   --  6.9*  PROCALCITON  --   --   --  3.16  TSH  --   --   --  2.390  HGBA1C  --   --   --  5.0  MG  --  1.9  --   --   CALCIUM  7.8* 7.6* 7.9* 8.4*    --------------------------------------------------------------------------------------------------------------- Lab Results  Component Value Date   CHOL 104 07/22/2019   HDL 30 (L) 07/22/2019   LDLCALC 37 07/22/2019   TRIG 185 (H) 07/22/2019   CHOLHDL 3.5 07/22/2019    Lab Results  Component Value Date   HGBA1C 5.0 04/10/2024   Recent Labs    04/10/24 0501  TSH 2.390  FREET4 1.26   No results  for input(s): VITAMINB12, FOLATE, FERRITIN, TIBC, IRON , RETICCTPCT in the last 72 hours. ------------------------------------------------------------------------------------------------------------------ Cardiac Enzymes No results for input(s): CKMB, TROPONINI, MYOGLOBIN in the last 168 hours.  Invalid input(s): CK  Micro Results No results found for this or any previous visit (from the past 240 hours).  Radiology Reports  No results found.      Signature  -   Brayton Lye M.D on 04/12/2024 at 12:15 PM   -  To page go to [email or web address]       P2/07/2024, 12:15 PM  "

## 2024-04-13 ENCOUNTER — Encounter (HOSPITAL_COMMUNITY): Payer: Self-pay | Admitting: Vascular Surgery

## 2024-04-13 LAB — GLUCOSE, CAPILLARY
Glucose-Capillary: 157 mg/dL — ABNORMAL HIGH (ref 70–99)
Glucose-Capillary: 156 mg/dL — ABNORMAL HIGH (ref 70–99)
Glucose-Capillary: 108 mg/dL — ABNORMAL HIGH (ref 70–99)

## 2024-04-13 MED ORDER — ALTEPLASE 2 MG IJ SOLR: 2.0000 mg | Freq: Once | INTRAMUSCULAR | Status: DC | PRN

## 2024-04-13 MED ORDER — HEPARIN SODIUM (PORCINE) 1000 UNIT/ML DIALYSIS
1000.0000 [IU] | INTRAMUSCULAR | Status: DC | PRN
  Administered 2024-04-13: 2800 [IU]

## 2024-04-13 MED ORDER — PENTAFLUOROPROP-TETRAFLUOROETH EX AERO: 1.0000 | INHALATION_SPRAY | CUTANEOUS | Status: DC | PRN

## 2024-04-13 MED ORDER — MIDODRINE HCL 5 MG PO TABS
10.0000 mg | ORAL_TABLET | Freq: Three times a day (TID) | ORAL | Status: AC
  Administered 2024-04-13: 10 mg via ORAL
  Filled 2024-04-13: qty 2

## 2024-04-13 MED ORDER — HEPARIN SODIUM (PORCINE) 1000 UNIT/ML IJ SOLN
INTRAMUSCULAR | Status: AC
  Filled 2024-04-13: qty 4

## 2024-04-13 MED ORDER — ANTICOAGULANT SODIUM CITRATE 4% (200MG/5ML) IV SOLN: 5.0000 mL | Status: DC | PRN

## 2024-04-13 MED ORDER — LIDOCAINE HCL (PF) 1 % IJ SOLN: 5.0000 mL | INTRAMUSCULAR | Status: DC | PRN

## 2024-04-13 MED ORDER — LIDOCAINE-PRILOCAINE 2.5-2.5 % EX CREA: 1.0000 | TOPICAL_CREAM | CUTANEOUS | Status: DC | PRN

## 2024-04-13 NOTE — Progress Notes (Signed)
" °   04/13/24 1842  Vitals  Temp (!) 97.5 F (36.4 C)  Pulse Rate 88  Resp 18  BP (!) 113/57  SpO2 100 %  O2 Device Room Air  Weight  (bed not weighing accurately)  Type of Weight Post-Dialysis  Oxygen Therapy  Patient Activity (if Appropriate) In bed  Pulse Oximetry Type Continuous  Oximetry Probe Site Changed No  Post Treatment  Dialyzer Clearance Lightly streaked  Liters Processed 54.1  Fluid Removed (mL) 2500 mL  Tolerated HD Treatment Yes   Received patient in bed to unit.  Alert and oriented.  Informed consent signed and in chart.   TX duration:3.0  Patient tolerated well.  Transported back to the room  Alert, without acute distress.  Hand-off given to patient's nurse.   Access used: LIJ trialysis Access issues: lines reversed-bfr at 300  Total UF removed: 2500 Medication(s) given: none   [name] "

## 2024-04-13 NOTE — Progress Notes (Signed)
 " [facility] Progress Note   Subjective:    Seen and examined patient at bedside. Patient's mother also at bedside. Noted hamburger and french fries at bedside today. I advised to limit outside foods as we are watching her K+ levels here. She didn't get HD yesterday 2nd high patient census. She refused HD earlier today (4am) but now has agreed to treatment. Will come to the HD unit this afternoon.  Objective Vitals:   04/13/24 1515 04/13/24 1530 04/13/24 1600 04/13/24 1630  BP: 122/60 125/61 121/60 (!) 124/59  Pulse: 87 86 84 84  Resp: 18 18 18 15   Temp:      TempSrc:      SpO2: 100% 100% 100% 100%  Weight:      Height:       Physical Exam General: chronically ill appearing, nad  Heart: RRR, no murmur Lungs: CTA bilaterally, respirations unlabored Abdomen: Soft, non-distended Extremities: 1-2+ edema bilateral lower extremities tender, rt LE amputation Dialysis Access: LIJ temp (1/27), lt BCF pulsatile and augments but very floppy skin  Filed Weights   04/10/24 1547 04/10/24 1822  Weight: 104 kg (S) 45.4 kg   No intake or output data in the 24 hours ending 04/13/24 1650  Additional Objective Labs: Basic Metabolic Panel: Recent Labs  Lab 04/07/24 1504 04/10/24 0501 04/12/24 1459  NA 138 143 140  K 3.5 3.9 4.5  CL 101 106 102  CO2 26 23 24   GLUCOSE 83 85 272*  BUN 14 23* 30*  CREATININE 3.31* 5.66* 5.27*  CALCIUM  7.9* 8.4* 8.6*  PHOS 2.4*  --   --    Liver Function Tests: Recent Labs  Lab 04/07/24 1504 04/10/24 0501  AST  --  15  ALT  --  <5  ALKPHOS  --  100  BILITOT  --  0.3  PROT  --  4.8*  ALBUMIN  2.9* 2.7*   No results for input(s): LIPASE, AMYLASE in the last 168 hours. CBC: Recent Labs  Lab 04/07/24 0000 04/07/24 1504 04/10/24 0501 04/12/24 1459  WBC 17.7* 14.3* 16.4* 18.0*  NEUTROABS  --  10.3* 12.1* 15.8*  HGB 8.7* 8.5* 9.2* 9.3*  HCT 26.5* 26.0* 27.7* 28.3*  MCV 97.4 97.4 98.6 98.3  PLT 267 232 264 262   Blood  Culture    Component Value Date/Time   SDES BLOOD RIGHT ANTECUBITAL 03/16/2024 1949   SPECREQUEST  03/16/2024 1949    BOTTLES DRAWN AEROBIC AND ANAEROBIC Blood Culture results may not be optimal due to an inadequate volume of blood received in culture bottles   CULT (A) 03/16/2024 1949    KLEBSIELLA PNEUMONIAE Confirmed Extended Spectrum Beta-Lactamase Producer (ESBL).  In bloodstream infections from ESBL organisms, carbapenems are preferred over piperacillin /tazobactam. They are shown to have a lower risk of mortality.    REPTSTATUS 03/19/2024 FINAL 03/16/2024 1949    Cardiac Enzymes: No results for input(s): CKTOTAL, CKMB, CKMBINDEX, TROPONINI in the last 168 hours. CBG: Recent Labs  Lab 04/12/24 1230 04/12/24 1611 04/12/24 2102 04/13/24 0736 04/13/24 1201  GLUCAP 214* 254* 239* 157* 156*   Iron  Studies: No results for input(s): IRON , TIBC, TRANSFERRIN, FERRITIN in the last 72 hours. Lab Results  Component Value Date   INR 1.8 (H) 03/18/2024   INR 1.9 (H) 03/17/2024   INR 1.5 (H) 03/22/2023   Studies/Results: CT HEAD WO CONTRAST ( ) Result Date: 04/12/2024 EXAM: CT HEAD WITHOUT CONTRAST 04/12/2024 07:49:00 PM TECHNIQUE: CT of the head was performed without the administration of intravenous contrast.  Automated exposure control, iterative reconstruction, and/or weight based adjustment of the mA/kV was utilized to reduce the radiation dose to as low as reasonably achievable. COMPARISON: 02/14/2024 CLINICAL HISTORY: Fall. FINDINGS: BRAIN AND VENTRICLES: No acute hemorrhage. No evidence of acute infarct. No hydrocephalus. No extra-axial collection. No mass effect or midline shift. Atherosclerotic calcifications within the cavernous internal carotid arteries. SINUSES: No acute abnormality. SOFT TISSUES AND SKULL: Bilateral lens replacement noted. No acute soft tissue abnormality. No skull fracture. IMPRESSION: 1. No acute intracranial abnormality. Electronically signed  by: Donnice Mania MD 04/12/2024 07:57 PM EST RP Workstation: [id number]    Medications:  anticoagulant sodium citrate      ertapenem  500 mg (04/13/24 0007)    amiodarone   200 mg Oral Daily   ascorbic acid   500 mg Oral BID   atorvastatin   10 mg Oral Daily   busPIRone   5 mg Oral TID   darbepoetin (ARANESP ) injection - DIALYSIS  150 mcg Subcutaneous Q Wed-1800   dextrose   50 mL Intravenous Once   famotidine   20 mg Oral QHS   fluconazole   200 mg Oral Weekly   Gerhardt's butt cream   Topical BID   heparin  injection (subcutaneous)  5,000 Units Subcutaneous Q8H   linezolid   600 mg Oral Q12H   midodrine   10 mg Oral TID WC   multivitamin  1 tablet Oral QHS   mouth rinse  15 mL Mouth Rinse 4 times per day   risperiDONE   1 mg Oral QHS   sodium chloride  flush  10-40 mL Intracatheter Q12H   vancomycin   125 mg Oral Daily    Dialysis Orders: [facility] TTS 2nd shift 4hr 180NRe 400/600 EDW 96.1kg  3/2.5 Mircera 225mcg q2wk Hectorol   Assessment/Plan: ESRD - Usual TTS schedule ->  Intermittently refusing dialysis.  - s/p CRRT 1/10-1/13 given significant shock - Dialyzed Friday 1/23 prior to [place] removal.  Appreciate VIR placing LIJ temp cath on 1/27 Eventually will need TC; maybe conversion later in the week. Lt BCF is not usable at this time (very floppy skin and will only lead to infiltration) - Next HD later today and will assess for HD needs for tomorrow. -  WBCs 16.4 so hold on [facility] placement.    PAfib - cardiology following, back on PO amiodarone    Hypotension/volume - Septic on admit,  S/p pressors - Now on high dose midodrine  20mg  TID + extra prn with HD. - primary starting therapeutic trial of Decadron     Sepsis/ESBL Klebsiella bacteremia - On ertapenem , has been ordered for outpatient. Per RN note 1/30 note-- OP clinic does have IV ertapenem  - Blood Cx 03/16/24 positive - issues getting repeat cultures as she has horrible peripheral veins - TDC is out -> currently with LIJ  temp - Complete 14 days IV ertapenem  from 1/23. EOT 04/13/24    Progressive dusky rash to pelvic/groin area, lower abd pannus and B legs/suspected necrotizing fascitis/cellulitis - seen by general surgery for worsening fever, hypotension - s/p debridement on 04/02/24 -on  combination of linezolid , ertapenem , fluconazole  per ID   Anemia of ESRD - Hgb 9.2 - S/p 2U PRBCs on 1/23 - Continue weekly Aranesp    Diarrhea - On empiric PO Vanc given Hx C.diff - Toxin negative on 1/10   T2DM - Insulin  per primary   Dispo/GOC - Palliative following. Not ready for de-escalation of care. - Plan is SNF  [name], NP [facility] 04/13/2024,4:50 PM  LOS: 28 days    "

## 2024-04-13 NOTE — Progress Notes (Signed)
 Pt refuse TX per primary nurse she want to do tx saturday

## 2024-04-13 NOTE — Progress Notes (Signed)
 Attempted to call patient family in regards to fall 04/12/2024. No answer.

## 2024-04-13 NOTE — Progress Notes (Signed)
 SPIRITUAL CARE AND COUNSELING CONSULT NOTE   VISIT SUMMARY Chaplain followed up on notarization of advance directive documents. Pt Deborah [name] stated that she still needs to get contact information for her niece.   Her bigger concern was extreme pain from a fall she suffered last night. RN was bedside and made aware. Deborah [name] understands to reach out once paperwork has been completed. No further spiritual care needs at this time, however chaplains continue to remain available.  SPIRITUAL ENCOUNTER                                                                                                                                                                      Type of Visit: Follow up Care provided to:: Patient Conversation partners present during encounter: Nurse Reason for visit: Advance directives OnCall Visit: No  If immediate needs arise, please contact [place] 24 hour on call [phone]   [name], Chaplain  04/13/2024 9:24 AM

## 2024-04-13 NOTE — Plan of Care (Signed)

## 2024-04-13 NOTE — Progress Notes (Signed)
 " PROGRESS NOTE  Deborah [name]  FMW:[id number] DOB: [birth year] DOA: 03/16/2024 PCP: Pcp, No   Brief Narrative: Patient is a 59 year old female with past medical history of paroxysmal A-fib, hyperlipidemia, right BKA, ESRD on dialysis on TTS schedule who presented from skilled nursing facility for further evaluation of syncopal episode with loss of consciousness.  On presentation, she was hypotensive.  Lab work showed creatinine of 7, troponin of 105, lactic acid of 9.  Patient also reported diffuse abdominal pain.  Patient was admitted and was started on broad-spectrum antibiotics.  She was recently hospitalized on last December for sepsis secondary to left thigh abscess and underwent I&D with general surgery on 02/16/2024.  On this admission, she was found to have sepsis secondary to Klebsiella bacteremia and was on meropenem , she underwent multiple sessions of CRRT .  She was transferred to [facility] service on 03/22/2024 but remained hypotensive, patient was also refusing care.  Found to have new progressive bilateral thigh erythema and swelling, concern for necrotizing fasciitis.  General surgery consulted.  Underwent excisional debridement of the left thigh skin, subcutaneous tissue.  Was briefly transferred to ICU for that on 1/25.  Patient transferred back to [facility] service on 1/26. Underwent placement of left internal jugular temporary dialysis catheter by IR today  Major Hospital events:  1/9 ED s/p witnessed syncopal event w/ +LOC in shock, started on Levo/Vaso; Aline/CVC placed 1/10.  CT abdomen pelvis in the ER.  Nonacute.   1/10 CRRT, still on vaso, NE. Esclated to meropenem  for ESBL Klebsiella 1/11 echocardiogram EF 60%.  No acute changes 1/12: off levophed , on vasopressin  1/13: off vasopressin , steroids, and CRRT 1/14: transfer out of ICU, [facility] to pick up 1/15 1/23: Transfused units PRBC for hemoglobin of 6.7 1/23: TDC catheter continued by IR 1/25: Remains febrile, with rapidly  progressive bilateral groin/thigh/lower abdomen area erythema swelling concerning for necrotizing fasciitis 1/27: Underwent placement of left internal jugular temporary dialysis catheter by IR Right IJ central line removed 04/04/2024   Subjective:   Patient in bed, appears comfortable, she denies any complaints today, but as discussed with staff she had a fall from the bed yesterday, CT head with no acute findings.      Assessment & Plan:  Suspected necrotizing fasciitis/cellulitis of left lower extremity, history of abscess in the left thigh before:  Developed rapidly progressive lower extremity edema.  There was concern for necrotizing fasciitis.  General surgery consulted.  Underwent excisional debridement of the left thigh on 04/02/24.  She also had a history of left groin abscess on last admission and underwent I&D by Dr. Vernetta on 02/16/2024.  At that time CT abdomen/pelvis did not show any acute findings.  She was also seen by orthopedics, Dr. Harden on 03/22/2024.  Repeat CT abdomen pelvis and left thigh was ordered on 04/09/2024, unfortunately patient refused blood test despite extensive counseling by me and nurse on 04/09/2021.  ID and CCS following currently on combination of linezolid  stop date on 04/15/2024, ertapenem  stop date 04/12/2024, fluconazole  stop date of 05/01/2024, continue prophylactic oral Vanco stop date 04/21/2024   Septic shock secondary to Klebsiella pneumonia bacteremia:  Source of infection was unclear but now appears to be left thigh necrotizing fasciitis, she also had an indwelling HD catheter which was removed and a new catheter placed this admission, ID following kindly see above.  ESRD on dialysis: Nephrology following.  Dialyzed on TTS schedule.  IR placed new temporary dialysis catheter on 1/27  Chronic hypotension:  - Patient  with chronic hypotension at baseline. - This has improved with midodrine  and albumin  . - Cortisol level on the lower side, but she has been  on Decadron , so cannot do cosyntropin  stimulation test, will continue to hold Decadron  for now, hold on cosyntropin  test given now her blood pressure more stable. - Blood pressure more on the higher side so I will decrease her midodrine    Opioid use disorder/substance abuse/history of fibromyalgia: Follows outpatient with [facility] pain clinic.  Minimize narcotics, history of narcotic seeking behavior, use narcotics with caution as blood pressure tends to fall with high doses.  Patient has been counseled multiple times.  History of C. difficile: C. difficile negative.  On prophylactic vancomycin  because she is on antibiotics  Anemia of chronic disease: Likely secondary to ESRD.  Given blood transfusion here.  Currently hemoglobin is stable  Syncope: Echo showed EF of 60 to 65%, mild LVH, a small pericardial effusion posteriorly.  Troponin elevation/paroxysmal A-fib: Not on anticoagulant due to history of GI bleed.  Seen by cardiology for RVR placed on amiodarone  this admission, currently in sinus, echo done which showed preserved EF of 60% and no wall motion abnormality.  Stable from the standpoint.   Hyperlipidemia: On statin  History of duodenal carcinoid tumor s/p resection and lung monitor/gastric ulcer/gastritis/chronic gastroparesis: On H2 blocker,  History of peripheral artery disease: Status post right BKA.  Stump is well-healed  Possible vaginal yeast infection: On weekly Diflucan   Anxiety/depression: On BuSpar , risperidone , trazodone  at home  Diabetes type 2: On sliding scale now.  A1c 4.3 as per 02/15/2024.  Will DC insulin   Debility/deconditioning: Patient is from SNF.  Plan for discharge to SNF when appropriate.  Long length of stay  Hypoglycemia.  See above.  Monitor, D50 as needed.  Lab Results  Component Value Date   HGBA1C 5.0 04/10/2024    Lab Results  Component Value Date   TSH 2.390 04/10/2024    CBG (last 3)  Recent Labs    04/12/24 1611 04/12/24 2102  04/13/24 0736  GLUCAP 254* 239* 157*      Pressure ulcers:  Had wound on the left thigh, right hip, lower abdomen, buttocks, left heel.  Wound care nurse following .Dr. Harden was consulted to evaluate left thigh postop site.   Nutrition Problem: Moderate Malnutrition Etiology: acute illness Wound 02/19/24 0200 Pressure Injury Foot Left;Lateral Unstageable - Full thickness tissue loss in which the base of the injury is covered by slough (yellow, tan, gray, green or brown) and/or eschar (tan, brown or black) in the wound bed. (Active)     Wound 03/17/24 0045 Pressure Injury Thigh Left;Posterior Stage 2 -  Partial thickness loss of dermis presenting as a shallow open injury with a red, pink wound bed without slough. (Active)     Wound 03/17/24 0045 Pressure Injury Hip Lateral;Right Stage 2 -  Partial thickness loss of dermis presenting as a shallow open injury with a red, pink wound bed without slough. (Active)     Wound 03/17/24 0045 Pressure Injury Abdomen Lower;Left Stage 2 -  Partial thickness loss of dermis presenting as a shallow open injury with a red, pink wound bed without slough. (Active)     Wound 03/17/24 1119 Pressure Injury Buttocks Right;Left;Medial Stage 3 -  Full thickness tissue loss. Subcutaneous fat may be visible but bone, tendon or muscle are NOT exposed. (Active)    DVT prophylaxis:heparin  injection 5,000 Units Start: 04/02/24 1400 Place and maintain sequential compression device Start: 03/20/24 1800  Code Status: Full Code  Family Communication: None at the bedside  Patient status: Inpatient inpatient  Patient is from : SNF  Anticipated discharge to: SNF  Estimated DC date: Not sure.  Needs clearance from ID,nephrology   Consultants: Nephrology, critical care, general surgery, cardiology, palliative care  Procedures: Dialysis, I&D  Objective: Vitals:   04/12/24 2020 04/12/24 2320 04/13/24 0627 04/13/24 0730  BP: (!) 136/58 134/72 130/65 127/74   Pulse:    92  Resp: 14 17 15 15   Temp: 98.7 F (37.1 C)  98.4 F (36.9 C) 97.6 F (36.4 C)  TempSrc: Oral Oral Oral Oral  SpO2:      Weight:      Height:        Intake/Output Summary (Last 24 hours) at 04/13/2024 0920 Last data filed at 04/12/2024 1000 Gross per 24 hour  Intake 100 ml  Output --  Net 100 ml      Filed Weights   04/01/24 0500 04/10/24 1547 04/10/24 1822  Weight: 96.5 kg 104 kg (S) 45.4 kg    Examination:  Awake Alert, appears comfortable, no apparent distress, she is chronically deconditioned, ill-appearing Diminished air entry at the bases bilaterally RRR,No Gallops,Rubs or new Murmurs, No Parasternal Heave +ve B.Sounds, Abd Soft, No tenderness, No rebound - guarding or rigidity. Left BKA right BKA, ulcer on the left ankle and heel and thigh Left IJ line present     Data Review:   Patient Lines/Drains/Airways Status     Active Line/Drains/Airways     Name Placement date Placement time Site Days   Fistula / Graft Left Upper arm Arteriovenous fistula 06/30/22  1200  Upper arm  653   Fistula / Graft Left Upper arm Arteriovenous fistula --  --  Upper arm  --   Hemodialysis Catheter Left Internal jugular Triple lumen Temporary (Non-Tunneled) 04/03/24  0951  Internal jugular  10   Wound 02/16/24 1900 Surgical Open Surgical Incision Groin Anterior;Left;Proximal 02/16/24  1900  Groin  57   Wound 02/19/24 0200 Pressure Injury Foot Left;Lateral Unstageable - Full thickness tissue loss in which the base of the injury is covered by slough (yellow, tan, gray, green or brown) and/or eschar (tan, brown or black) in the wound bed. 02/19/24  0200  Foot  54   Wound 03/17/24 0045 Pressure Injury Thigh Left;Posterior Stage 2 -  Partial thickness loss of dermis presenting as a shallow open injury with a red, pink wound bed without slough. 03/17/24  0045  Thigh  27   Wound 03/17/24 0045 Pressure Injury Hip Lateral;Right Stage 2 -  Partial thickness loss of dermis  presenting as a shallow open injury with a red, pink wound bed without slough. 03/17/24  0045  Hip  27   Wound 03/17/24 0045 Pressure Injury Abdomen Lower;Left Stage 2 -  Partial thickness loss of dermis presenting as a shallow open injury with a red, pink wound bed without slough. 03/17/24  0045  Abdomen  27   Wound 03/17/24 1119 Pressure Injury Buttocks Right;Left;Medial Stage 3 -  Full thickness tissue loss. Subcutaneous fat may be visible but bone, tendon or muscle are NOT exposed. 03/17/24  1119  Buttocks  27   Wound 04/01/24 1500 Surgical Thigh Anterior;Distal;Left 04/01/24  1500  Thigh  12             Inpatient Medications  Scheduled Meds:  amiodarone   200 mg Oral Daily   ascorbic acid   500 mg Oral BID   atorvastatin   10  mg Oral Daily   busPIRone   5 mg Oral TID   darbepoetin (ARANESP ) injection - DIALYSIS  150 mcg Subcutaneous Q Wed-1800   dextrose   50 mL Intravenous Once   famotidine   20 mg Oral QHS   fluconazole   200 mg Oral Weekly   Gerhardt's butt cream   Topical BID   heparin  injection (subcutaneous)  5,000 Units Subcutaneous Q8H   linezolid   600 mg Oral Q12H   midodrine   15 mg Oral Q8H   multivitamin  1 tablet Oral QHS   mouth rinse  15 mL Mouth Rinse 4 times per day   risperiDONE   1 mg Oral QHS   sodium chloride  flush  10-40 mL Intracatheter Q12H   vancomycin   125 mg Oral Daily   Continuous Infusions:  ertapenem  500 mg (04/13/24 0007)   PRN Meds:.acetaminophen , dextrose , heparin  sodium (porcine), HYDROmorphone  (DILAUDID ) injection, hydrOXYzine , mouth rinse, polyethylene glycol, senna, sodium chloride  flush, traMADol   DVT Prophylaxis  heparin  injection 5,000 Units Start: 04/02/24 1400 Place and maintain sequential compression device Start: 03/20/24 1800   Recent Labs  Lab 04/07/24 0000 04/07/24 1504 04/10/24 0501 04/12/24 1459  WBC 17.7* 14.3* 16.4* 18.0*  HGB 8.7* 8.5* 9.2* 9.3*  HCT 26.5* 26.0* 27.7* 28.3*  PLT 267 232 264 262  MCV 97.4 97.4 98.6  98.3  MCH 32.0 31.8 32.7 32.3  MCHC 32.8 32.7 33.2 32.9  RDW 18.0* 17.8* 18.2* 17.6*  LYMPHSABS  --  2.1 2.7 1.7  MONOABS  --  0.9 1.1* 0.3  EOSABS  --  0.8* 0.3 0.0  BASOSABS  --  0.1 0.1 0.0    Recent Labs  Lab 04/07/24 0000 04/07/24 1504 04/10/24 0501 04/12/24 1459  NA 138 138 143 140  K 4.0 3.5 3.9 4.5  CL 101 101 106 102  CO2 22 26 23 24   ANIONGAP 15 11 14 14   GLUCOSE 57* 83 85 272*  BUN 28* 14 23* 30*  CREATININE 5.45* 3.31* 5.66* 5.27*  AST  --   --  15  --   ALT  --   --  <5  --   ALKPHOS  --   --  100  --   BILITOT  --   --  0.3  --   ALBUMIN   --  2.9* 2.7*  --   CRP  --   --  6.9* 3.2*  PROCALCITON  --   --  3.16  --   TSH  --   --  2.390  --   HGBA1C  --   --  5.0  --   MG 1.9  --   --   --   PHOS  --  2.4*  --   --   CALCIUM  7.6* 7.9* 8.4* 8.6*      Recent Labs  Lab 04/07/24 0000 04/07/24 1504 04/10/24 0501 04/12/24 1459  CRP  --   --  6.9* 3.2*  PROCALCITON  --   --  3.16  --   TSH  --   --  2.390  --   HGBA1C  --   --  5.0  --   MG 1.9  --   --   --   CALCIUM  7.6* 7.9* 8.4* 8.6*    --------------------------------------------------------------------------------------------------------------- Lab Results  Component Value Date   CHOL 104 07/22/2019   HDL 30 (L) 07/22/2019   LDLCALC 37 07/22/2019   TRIG 185 (H) 07/22/2019   CHOLHDL 3.5 07/22/2019    Lab Results  Component Value Date  HGBA1C 5.0 04/10/2024   No results for input(s): TSH, T4TOTAL, FREET4, T3FREE, THYROIDAB in the last 72 hours.  No results for input(s): VITAMINB12, FOLATE, FERRITIN, TIBC, IRON , RETICCTPCT in the last 72 hours. ------------------------------------------------------------------------------------------------------------------ Cardiac Enzymes No results for input(s): CKMB, TROPONINI, MYOGLOBIN in the last 168 hours.  Invalid input(s): CK  Micro Results No results found for this or any previous visit (from the past 240  hours).  Radiology Reports  CT HEAD WO CONTRAST ( ) Result Date: 04/12/2024 EXAM: CT HEAD WITHOUT CONTRAST 04/12/2024 07:49:00 PM TECHNIQUE: CT of the head was performed without the administration of intravenous contrast. Automated exposure control, iterative reconstruction, and/or weight based adjustment of the mA/kV was utilized to reduce the radiation dose to as low as reasonably achievable. COMPARISON: 02/14/2024 CLINICAL HISTORY: Fall. FINDINGS: BRAIN AND VENTRICLES: No acute hemorrhage. No evidence of acute infarct. No hydrocephalus. No extra-axial collection. No mass effect or midline shift. Atherosclerotic calcifications within the cavernous internal carotid arteries. SINUSES: No acute abnormality. SOFT TISSUES AND SKULL: Bilateral lens replacement noted. No acute soft tissue abnormality. No skull fracture. IMPRESSION: 1. No acute intracranial abnormality. Electronically signed by: Donnice Mania MD 04/12/2024 07:57 PM EST RP Workstation: [id number]        Signature  -   Brayton Lye M.D on 04/13/2024 at 9:20 AM   -  To page go to [email or web address]      P2/08/2024, 9:20 AM  "

## 2024-04-13 NOTE — Progress Notes (Signed)
 Post fall   04/12/24 1854  Charting Type  Charting Type Full Reassessment Changes Noted  Focused Reassessment No Changes Neurological  [place] Work Intensity Score (Update with each assessment and as needed)  Work Intensity Score (Level) 3  Level 3 Intensity F.Total dependent care with all ADL's & mobility  Neurological  Neuro (WDL) X (post fall)  Orientation Level Oriented X4  Cognition Follows commands  Speech Clear  R Pupil Size (mm) 3  R Pupil Shape Round  R Pupil Reaction Brisk  L Pupil Size (mm) 3  L Pupil Shape Round  L Pupil Reaction Brisk  Motor Function/Sensation Assessment Grip  R Hand Grip Moderate  L Hand Grip Moderate  Neuro Symptoms None  Glasgow Coma Scale  Eye Opening 4  Best Verbal Response (NON-intubated) 5  Best Motor Response 6  Glasgow Coma Scale Score 15  ECG Monitoring  ECG Heart Rate 96  Musculoskeletal  Musculoskeletal (WDL) X  Assistive Device None  Generalized Weakness Yes  Integumentary  Integumentary (WDL) X (no deformity or new skin issue after fall)  Skin Color Appropriate for ethnicity  Skin Condition Dry;Flaky  Neurological  Level of Consciousness Alert

## 2024-04-13 NOTE — TOC Progression Note (Signed)
 Transition of Care [facility]) - Progression Note    Patient Details  Name: SADEEN WIEGEL MRN: [id number] Date of Birth: [birth year]  [facility] CM/SW Contact  Inocente GORMAN Kindle, LCSW Phone Number: 04/13/2024, 8:37 AM  Clinical Narrative:    CSW continuing to follow for medical stability. Updated [facility].    Expected Discharge Plan: Long Term Nursing Home Barriers to Discharge: Continued Medical Work up               Expected Discharge Plan and Services       Living arrangements for the past 2 months: Skilled Nursing Facility                                       Social Drivers of Health (SDOH) Interventions SDOH Screenings   Food Insecurity: No Food Insecurity (03/18/2024)  Housing: Low Risk (03/18/2024)  Transportation Needs: No Transportation Needs (03/18/2024)  Utilities: Not At Risk (03/18/2024)  Recent Concern: Utilities - At Risk (02/18/2024)  Depression (PHQ2-9): Medium Risk (03/16/2023)  Tobacco Use: Low Risk (04/01/2024)    Readmission Risk Interventions    03/28/2024    3:24 PM 02/20/2024   11:05 AM 03/07/2023    4:19 PM  Readmission Risk Prevention Plan  Transportation Screening Complete Complete Complete  Medication Review Oceanographer) Complete Complete Complete  PCP or Specialist appointment within 3-5 days of discharge Complete Complete Complete  HRI or Home Care Consult Complete Complete Complete  SW Recovery Care/Counseling Consult Complete Complete Complete  Palliative Care Screening Complete Not Applicable Complete  Skilled Nursing Facility Complete Complete Not Applicable

## 2024-04-30 ENCOUNTER — Ambulatory Visit: Admitting: Cardiology

## 2024-05-04 ENCOUNTER — Inpatient Hospital Stay: Payer: Self-pay | Admitting: Infectious Diseases
